# Patient Record
Sex: Female | Born: 1935 | Race: White | Hispanic: No | Smoking: Never smoker
Health system: Southern US, Community
[De-identification: ages and names within clinical notes are randomized; demographics above are authoritative.]

## PROBLEM LIST (undated history)

## (undated) DIAGNOSIS — Z531 Procedure and treatment not carried out because of patient's decision for reasons of belief and group pressure: Secondary | ICD-10-CM

## (undated) DIAGNOSIS — Z8542 Personal history of malignant neoplasm of other parts of uterus: Secondary | ICD-10-CM

## (undated) DIAGNOSIS — IMO0001 Reserved for inherently not codable concepts without codable children: Secondary | ICD-10-CM

## (undated) DIAGNOSIS — K3184 Gastroparesis: Secondary | ICD-10-CM

## (undated) DIAGNOSIS — H269 Unspecified cataract: Secondary | ICD-10-CM

## (undated) DIAGNOSIS — D500 Iron deficiency anemia secondary to blood loss (chronic): Secondary | ICD-10-CM

## (undated) DIAGNOSIS — R011 Cardiac murmur, unspecified: Secondary | ICD-10-CM

## (undated) DIAGNOSIS — Z789 Other specified health status: Secondary | ICD-10-CM

## (undated) DIAGNOSIS — G2581 Restless legs syndrome: Secondary | ICD-10-CM

## (undated) DIAGNOSIS — D649 Anemia, unspecified: Secondary | ICD-10-CM

## (undated) DIAGNOSIS — I639 Cerebral infarction, unspecified: Secondary | ICD-10-CM

## (undated) DIAGNOSIS — M1990 Unspecified osteoarthritis, unspecified site: Secondary | ICD-10-CM

## (undated) DIAGNOSIS — R7989 Other specified abnormal findings of blood chemistry: Secondary | ICD-10-CM

## (undated) DIAGNOSIS — I350 Nonrheumatic aortic (valve) stenosis: Secondary | ICD-10-CM

## (undated) DIAGNOSIS — C652 Malignant neoplasm of left renal pelvis: Secondary | ICD-10-CM

## (undated) DIAGNOSIS — I10 Essential (primary) hypertension: Secondary | ICD-10-CM

## (undated) DIAGNOSIS — K602 Anal fissure, unspecified: Secondary | ICD-10-CM

## (undated) DIAGNOSIS — N059 Unspecified nephritic syndrome with unspecified morphologic changes: Secondary | ICD-10-CM

## (undated) DIAGNOSIS — R112 Nausea with vomiting, unspecified: Secondary | ICD-10-CM

## (undated) DIAGNOSIS — Z9989 Dependence on other enabling machines and devices: Secondary | ICD-10-CM

## (undated) DIAGNOSIS — I77810 Thoracic aortic ectasia: Secondary | ICD-10-CM

## (undated) DIAGNOSIS — K627 Radiation proctitis: Secondary | ICD-10-CM

## (undated) DIAGNOSIS — E039 Hypothyroidism, unspecified: Secondary | ICD-10-CM

## (undated) DIAGNOSIS — Z972 Presence of dental prosthetic device (complete) (partial): Secondary | ICD-10-CM

## (undated) DIAGNOSIS — I2510 Atherosclerotic heart disease of native coronary artery without angina pectoris: Secondary | ICD-10-CM

## (undated) DIAGNOSIS — I480 Paroxysmal atrial fibrillation: Secondary | ICD-10-CM

## (undated) DIAGNOSIS — Z973 Presence of spectacles and contact lenses: Secondary | ICD-10-CM

## (undated) DIAGNOSIS — R945 Abnormal results of liver function studies: Secondary | ICD-10-CM

## (undated) DIAGNOSIS — M869 Osteomyelitis, unspecified: Secondary | ICD-10-CM

## (undated) DIAGNOSIS — E785 Hyperlipidemia, unspecified: Secondary | ICD-10-CM

## (undated) DIAGNOSIS — C801 Malignant (primary) neoplasm, unspecified: Secondary | ICD-10-CM

## (undated) DIAGNOSIS — C169 Malignant neoplasm of stomach, unspecified: Secondary | ICD-10-CM

## (undated) DIAGNOSIS — C541 Malignant neoplasm of endometrium: Secondary | ICD-10-CM

## (undated) DIAGNOSIS — K219 Gastro-esophageal reflux disease without esophagitis: Secondary | ICD-10-CM

## (undated) DIAGNOSIS — N189 Chronic kidney disease, unspecified: Secondary | ICD-10-CM

## (undated) DIAGNOSIS — Z9889 Other specified postprocedural states: Secondary | ICD-10-CM

## (undated) DIAGNOSIS — I495 Sick sinus syndrome: Secondary | ICD-10-CM

## (undated) DIAGNOSIS — F419 Anxiety disorder, unspecified: Secondary | ICD-10-CM

## (undated) DIAGNOSIS — I517 Cardiomegaly: Secondary | ICD-10-CM

## (undated) DIAGNOSIS — Z950 Presence of cardiac pacemaker: Secondary | ICD-10-CM

## (undated) DIAGNOSIS — G629 Polyneuropathy, unspecified: Secondary | ICD-10-CM

## (undated) DIAGNOSIS — H9190 Unspecified hearing loss, unspecified ear: Secondary | ICD-10-CM

## (undated) DIAGNOSIS — C679 Malignant neoplasm of bladder, unspecified: Secondary | ICD-10-CM

## (undated) HISTORY — DX: Essential (primary) hypertension: I10

## (undated) HISTORY — DX: Sick sinus syndrome: I49.5

## (undated) HISTORY — PX: NEPHRECTOMY: SHX65

## (undated) HISTORY — PX: PATENT DUCTUS ARTERIOUS REPAIR: SHX269

## (undated) HISTORY — DX: Hyperlipidemia, unspecified: E78.5

## (undated) HISTORY — DX: Gastroparesis: K31.84

## (undated) HISTORY — DX: Paroxysmal atrial fibrillation: I48.0

## (undated) HISTORY — DX: Malignant neoplasm of bladder, unspecified: C67.9

## (undated) HISTORY — DX: Malignant neoplasm of endometrium: C54.1

## (undated) HISTORY — DX: Nonrheumatic aortic (valve) stenosis: I35.0

## (undated) HISTORY — DX: Gastro-esophageal reflux disease without esophagitis: K21.9

## (undated) HISTORY — DX: Chronic kidney disease, unspecified: N18.9

## (undated) HISTORY — PX: EYE SURGERY: SHX253

## (undated) HISTORY — DX: Atherosclerotic heart disease of native coronary artery without angina pectoris: I25.10

## (undated) HISTORY — DX: Anal fissure, unspecified: K60.2

## (undated) HISTORY — DX: Hypothyroidism, unspecified: E03.9

## (undated) HISTORY — DX: Malignant neoplasm of left renal pelvis: C65.2

## (undated) HISTORY — PX: CHOLECYSTECTOMY: SHX55

## (undated) HISTORY — DX: Abnormal results of liver function studies: R94.5

## (undated) HISTORY — DX: Other specified abnormal findings of blood chemistry: R79.89

## (undated) HISTORY — DX: Thoracic aortic ectasia: I77.810

## (undated) HISTORY — DX: Iron deficiency anemia secondary to blood loss (chronic): D50.0

## (undated) HISTORY — DX: Personal history of malignant neoplasm of other parts of uterus: Z85.42

## (undated) HISTORY — DX: Radiation proctitis: K62.7

---
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
## 1983-09-07 HISTORY — PX: TOTAL ABDOMINAL HYSTERECTOMY: SHX209

## 2000-12-30 ENCOUNTER — Encounter: Payer: Self-pay | Admitting: Gastroenterology

## 2000-12-30 ENCOUNTER — Ambulatory Visit (HOSPITAL_COMMUNITY): Admission: RE | Admit: 2000-12-30 | Discharge: 2000-12-30 | Payer: Self-pay | Admitting: Gastroenterology

## 2001-01-09 ENCOUNTER — Ambulatory Visit (HOSPITAL_COMMUNITY): Admission: RE | Admit: 2001-01-09 | Discharge: 2001-01-09 | Payer: Self-pay | Admitting: Gastroenterology

## 2001-01-16 ENCOUNTER — Ambulatory Visit (HOSPITAL_COMMUNITY): Admission: RE | Admit: 2001-01-16 | Discharge: 2001-01-16 | Payer: Self-pay | Admitting: Gastroenterology

## 2001-01-16 ENCOUNTER — Encounter: Payer: Self-pay | Admitting: Gastroenterology

## 2002-01-02 ENCOUNTER — Encounter: Payer: Self-pay | Admitting: Urology

## 2002-01-02 ENCOUNTER — Encounter: Admission: RE | Admit: 2002-01-02 | Discharge: 2002-01-02 | Payer: Self-pay | Admitting: Urology

## 2002-06-05 ENCOUNTER — Encounter: Admission: RE | Admit: 2002-06-05 | Discharge: 2002-06-05 | Payer: Self-pay | Admitting: Urology

## 2002-06-05 ENCOUNTER — Encounter: Payer: Self-pay | Admitting: Urology

## 2002-06-11 ENCOUNTER — Encounter: Payer: Self-pay | Admitting: Urology

## 2002-06-11 ENCOUNTER — Ambulatory Visit (HOSPITAL_BASED_OUTPATIENT_CLINIC_OR_DEPARTMENT_OTHER): Admission: RE | Admit: 2002-06-11 | Discharge: 2002-06-11 | Payer: Self-pay | Admitting: Urology

## 2002-06-11 ENCOUNTER — Encounter (INDEPENDENT_AMBULATORY_CARE_PROVIDER_SITE_OTHER): Payer: Self-pay | Admitting: Specialist

## 2002-06-15 ENCOUNTER — Encounter: Admission: RE | Admit: 2002-06-15 | Discharge: 2002-06-15 | Payer: Self-pay | Admitting: Urology

## 2002-06-15 ENCOUNTER — Encounter: Payer: Self-pay | Admitting: Urology

## 2002-06-20 ENCOUNTER — Encounter: Payer: Self-pay | Admitting: Urology

## 2002-06-27 ENCOUNTER — Encounter (INDEPENDENT_AMBULATORY_CARE_PROVIDER_SITE_OTHER): Payer: Self-pay | Admitting: Specialist

## 2002-06-27 ENCOUNTER — Inpatient Hospital Stay (HOSPITAL_COMMUNITY): Admission: RE | Admit: 2002-06-27 | Discharge: 2002-07-02 | Payer: Self-pay | Admitting: Urology

## 2002-12-12 ENCOUNTER — Ambulatory Visit (HOSPITAL_BASED_OUTPATIENT_CLINIC_OR_DEPARTMENT_OTHER): Admission: RE | Admit: 2002-12-12 | Discharge: 2002-12-12 | Payer: Self-pay | Admitting: Urology

## 2002-12-12 ENCOUNTER — Encounter (INDEPENDENT_AMBULATORY_CARE_PROVIDER_SITE_OTHER): Payer: Self-pay | Admitting: Specialist

## 2004-06-01 ENCOUNTER — Encounter: Admission: RE | Admit: 2004-06-01 | Discharge: 2004-06-01 | Payer: Self-pay | Admitting: Otolaryngology

## 2004-09-30 ENCOUNTER — Ambulatory Visit: Payer: Self-pay

## 2005-07-19 ENCOUNTER — Encounter (HOSPITAL_COMMUNITY): Admission: RE | Admit: 2005-07-19 | Discharge: 2005-09-03 | Payer: Self-pay | Admitting: Neurology

## 2005-07-22 ENCOUNTER — Ambulatory Visit (HOSPITAL_COMMUNITY): Admission: RE | Admit: 2005-07-22 | Discharge: 2005-07-22 | Payer: Self-pay | Admitting: Cardiovascular Disease

## 2005-12-15 ENCOUNTER — Ambulatory Visit: Payer: Self-pay | Admitting: Gastroenterology

## 2006-01-11 ENCOUNTER — Ambulatory Visit: Payer: Self-pay | Admitting: Gastroenterology

## 2006-01-31 ENCOUNTER — Encounter: Payer: Self-pay | Admitting: Gastroenterology

## 2006-03-23 ENCOUNTER — Emergency Department (HOSPITAL_COMMUNITY): Admission: EM | Admit: 2006-03-23 | Discharge: 2006-03-23 | Payer: Self-pay | Admitting: Emergency Medicine

## 2006-07-13 ENCOUNTER — Encounter: Admission: RE | Admit: 2006-07-13 | Discharge: 2006-07-13 | Payer: Self-pay | Admitting: Orthopedic Surgery

## 2006-09-06 HISTORY — PX: CARDIAC CATHETERIZATION: SHX172

## 2006-11-28 ENCOUNTER — Ambulatory Visit: Payer: Self-pay | Admitting: Gastroenterology

## 2006-11-28 LAB — CONVERTED CEMR LAB
AST: 22 units/L (ref 0–37)
Alkaline Phosphatase: 132 units/L — ABNORMAL HIGH (ref 39–117)
Basophils Relative: 0.5 % (ref 0.0–1.0)
CO2: 28 meq/L (ref 19–32)
Chloride: 109 meq/L (ref 96–112)
Creatinine, Ser: 1.1 mg/dL (ref 0.4–1.2)
HCT: 39.4 % (ref 36.0–46.0)
Hemoglobin: 13.6 g/dL (ref 12.0–15.0)
Monocytes Absolute: 0.7 10*3/uL (ref 0.2–0.7)
Neutrophils Relative %: 67.3 % (ref 43.0–77.0)
Potassium: 3.9 meq/L (ref 3.5–5.1)
RBC: 4.47 M/uL (ref 3.87–5.11)
RDW: 12.8 % (ref 11.5–14.6)
Sodium: 144 meq/L (ref 135–145)
Total Bilirubin: 0.6 mg/dL (ref 0.3–1.2)
Total Protein: 6.6 g/dL (ref 6.0–8.3)
WBC: 7.2 10*3/uL (ref 4.5–10.5)

## 2006-12-01 ENCOUNTER — Ambulatory Visit: Payer: Self-pay | Admitting: Gastroenterology

## 2006-12-01 ENCOUNTER — Encounter (INDEPENDENT_AMBULATORY_CARE_PROVIDER_SITE_OTHER): Payer: Self-pay | Admitting: Specialist

## 2006-12-08 ENCOUNTER — Ambulatory Visit (HOSPITAL_COMMUNITY): Admission: RE | Admit: 2006-12-08 | Discharge: 2006-12-08 | Payer: Self-pay | Admitting: Gastroenterology

## 2006-12-28 ENCOUNTER — Ambulatory Visit: Payer: Self-pay | Admitting: Gastroenterology

## 2007-01-09 ENCOUNTER — Ambulatory Visit (HOSPITAL_COMMUNITY): Admission: RE | Admit: 2007-01-09 | Discharge: 2007-01-09 | Payer: Self-pay | Admitting: Gastroenterology

## 2007-01-11 ENCOUNTER — Ambulatory Visit (HOSPITAL_COMMUNITY): Admission: RE | Admit: 2007-01-11 | Discharge: 2007-01-11 | Payer: Self-pay | Admitting: Gastroenterology

## 2007-01-17 ENCOUNTER — Ambulatory Visit: Payer: Self-pay | Admitting: Gastroenterology

## 2007-01-18 ENCOUNTER — Ambulatory Visit: Payer: Self-pay | Admitting: Gastroenterology

## 2007-02-28 ENCOUNTER — Encounter: Payer: Self-pay | Admitting: Gastroenterology

## 2007-02-28 ENCOUNTER — Ambulatory Visit (HOSPITAL_COMMUNITY): Admission: RE | Admit: 2007-02-28 | Discharge: 2007-02-28 | Payer: Self-pay | Admitting: Gastroenterology

## 2007-03-06 ENCOUNTER — Ambulatory Visit: Payer: Self-pay | Admitting: Gastroenterology

## 2007-03-21 ENCOUNTER — Observation Stay (HOSPITAL_COMMUNITY): Admission: EM | Admit: 2007-03-21 | Discharge: 2007-03-23 | Payer: Self-pay | Admitting: Emergency Medicine

## 2007-03-21 ENCOUNTER — Ambulatory Visit: Payer: Self-pay | Admitting: Cardiology

## 2007-03-22 ENCOUNTER — Ambulatory Visit: Payer: Self-pay | Admitting: Vascular Surgery

## 2007-03-22 ENCOUNTER — Encounter (INDEPENDENT_AMBULATORY_CARE_PROVIDER_SITE_OTHER): Payer: Self-pay | Admitting: Internal Medicine

## 2007-04-14 ENCOUNTER — Encounter: Admission: RE | Admit: 2007-04-14 | Discharge: 2007-04-14 | Payer: Self-pay | Admitting: Orthopedic Surgery

## 2007-05-31 ENCOUNTER — Ambulatory Visit: Payer: Self-pay | Admitting: Gastroenterology

## 2007-06-09 ENCOUNTER — Inpatient Hospital Stay (HOSPITAL_COMMUNITY): Admission: EM | Admit: 2007-06-09 | Discharge: 2007-06-13 | Payer: Self-pay | Admitting: *Deleted

## 2007-06-09 ENCOUNTER — Ambulatory Visit: Payer: Self-pay | Admitting: *Deleted

## 2007-08-29 ENCOUNTER — Encounter: Admission: RE | Admit: 2007-08-29 | Discharge: 2007-08-29 | Payer: Self-pay | Admitting: Orthopedic Surgery

## 2007-09-07 HISTORY — PX: TOTAL HIP REVISION: SHX763

## 2007-09-08 ENCOUNTER — Encounter: Admission: RE | Admit: 2007-09-08 | Discharge: 2007-09-08 | Payer: Self-pay | Admitting: Orthopedic Surgery

## 2007-10-10 ENCOUNTER — Inpatient Hospital Stay (HOSPITAL_COMMUNITY): Admission: RE | Admit: 2007-10-10 | Discharge: 2007-10-13 | Payer: Self-pay | Admitting: Orthopedic Surgery

## 2007-11-17 DIAGNOSIS — C649 Malignant neoplasm of unspecified kidney, except renal pelvis: Secondary | ICD-10-CM | POA: Insufficient documentation

## 2007-11-17 DIAGNOSIS — M869 Osteomyelitis, unspecified: Secondary | ICD-10-CM | POA: Insufficient documentation

## 2007-11-17 DIAGNOSIS — G2581 Restless legs syndrome: Secondary | ICD-10-CM

## 2007-11-17 DIAGNOSIS — E039 Hypothyroidism, unspecified: Secondary | ICD-10-CM

## 2007-11-17 DIAGNOSIS — N6019 Diffuse cystic mastopathy of unspecified breast: Secondary | ICD-10-CM

## 2007-11-17 DIAGNOSIS — K520 Gastroenteritis and colitis due to radiation: Secondary | ICD-10-CM

## 2007-11-17 DIAGNOSIS — K602 Anal fissure, unspecified: Secondary | ICD-10-CM | POA: Insufficient documentation

## 2007-12-16 ENCOUNTER — Inpatient Hospital Stay (HOSPITAL_COMMUNITY): Admission: EM | Admit: 2007-12-16 | Discharge: 2007-12-19 | Payer: Self-pay | Admitting: Emergency Medicine

## 2007-12-16 ENCOUNTER — Ambulatory Visit: Payer: Self-pay | Admitting: Cardiovascular Disease

## 2008-01-04 ENCOUNTER — Telehealth: Payer: Self-pay | Admitting: Gastroenterology

## 2008-08-16 ENCOUNTER — Ambulatory Visit: Payer: Self-pay | Admitting: Cardiovascular Disease

## 2008-08-17 ENCOUNTER — Inpatient Hospital Stay (HOSPITAL_COMMUNITY): Admission: EM | Admit: 2008-08-17 | Discharge: 2008-08-19 | Payer: Self-pay | Admitting: Emergency Medicine

## 2008-11-27 ENCOUNTER — Encounter (INDEPENDENT_AMBULATORY_CARE_PROVIDER_SITE_OTHER): Payer: Self-pay | Admitting: *Deleted

## 2009-01-02 ENCOUNTER — Ambulatory Visit: Payer: Self-pay | Admitting: Gastroenterology

## 2009-01-16 ENCOUNTER — Ambulatory Visit: Payer: Self-pay | Admitting: Gastroenterology

## 2009-06-27 ENCOUNTER — Telehealth: Payer: Self-pay | Admitting: Gastroenterology

## 2009-07-22 ENCOUNTER — Ambulatory Visit: Payer: Self-pay | Admitting: Gastroenterology

## 2010-04-06 ENCOUNTER — Ambulatory Visit (HOSPITAL_COMMUNITY): Admission: RE | Admit: 2010-04-06 | Discharge: 2010-04-06 | Payer: Self-pay | Admitting: Internal Medicine

## 2010-05-01 ENCOUNTER — Ambulatory Visit: Payer: Self-pay | Admitting: Cardiology

## 2010-05-21 ENCOUNTER — Ambulatory Visit: Payer: Self-pay | Admitting: Vascular Surgery

## 2010-06-03 ENCOUNTER — Ambulatory Visit: Payer: Self-pay | Admitting: Cardiology

## 2010-06-24 ENCOUNTER — Ambulatory Visit: Payer: Self-pay | Admitting: Cardiology

## 2010-06-29 ENCOUNTER — Encounter: Admission: RE | Admit: 2010-06-29 | Discharge: 2010-06-29 | Payer: Self-pay | Admitting: Cardiology

## 2010-06-29 ENCOUNTER — Ambulatory Visit: Payer: Self-pay | Admitting: Cardiology

## 2010-07-01 ENCOUNTER — Ambulatory Visit: Payer: Self-pay | Admitting: Cardiology

## 2010-07-02 ENCOUNTER — Ambulatory Visit: Payer: Self-pay | Admitting: Cardiology

## 2010-07-02 ENCOUNTER — Inpatient Hospital Stay (HOSPITAL_COMMUNITY)
Admission: RE | Admit: 2010-07-02 | Discharge: 2010-07-03 | Payer: Self-pay | Source: Home / Self Care | Admitting: Cardiology

## 2010-07-10 ENCOUNTER — Ambulatory Visit: Payer: Self-pay | Admitting: Cardiology

## 2010-07-14 ENCOUNTER — Encounter: Payer: Self-pay | Admitting: Internal Medicine

## 2010-07-20 ENCOUNTER — Ambulatory Visit: Payer: Self-pay

## 2010-07-20 ENCOUNTER — Encounter: Payer: Self-pay | Admitting: Internal Medicine

## 2010-07-22 ENCOUNTER — Telehealth (INDEPENDENT_AMBULATORY_CARE_PROVIDER_SITE_OTHER): Payer: Self-pay | Admitting: *Deleted

## 2010-07-24 ENCOUNTER — Ambulatory Visit: Payer: Self-pay | Admitting: Cardiology

## 2010-09-01 ENCOUNTER — Ambulatory Visit: Payer: Self-pay | Admitting: Cardiology

## 2010-09-21 ENCOUNTER — Ambulatory Visit: Payer: Self-pay | Admitting: Cardiology

## 2010-10-08 NOTE — Progress Notes (Signed)
Summary: question on device   Phone Note Call from Patient Call back at Work Phone [phone]   Caller: Patient Reason for Call: Talk to Nurse Summary of Call: pt has question re her pace maker. pt states her heart rate is been going up this morning.  Initial call taken by: Roe Coombs,  July 22, 2010 1:23 PM  Follow-up for Phone Call        Patient c/o heart racing. She will follow up with Dr. Patty Sermons in the am. Follow-up by: Altha Harm, LPN,  July 23, 2010 4:55 PM

## 2010-10-08 NOTE — Cardiovascular Report (Signed)
Summary: Office Visit   Office Visit   Imported By: Roderic Ovens 07/27/2010 15:47:59  _____________________________________________________________________  External Attachment:    Type:   Image     Comment:   External Document

## 2010-10-08 NOTE — Procedures (Signed)
Summary: wch/ gd  Medications Added FUROSEMIDE 20 MG TABS (FUROSEMIDE) Take 1/2 tablet by mouth once daily FISH OIL 1000 MG CAPS (OMEGA-3 FATTY ACIDS) Take 1 capsule by mouth every morning      Allergies Added:   Current Medications (verified): 1)  Atenolol-Chlorthalidone 100-25 Mg Tabs (Atenolol-Chlorthalidone) .... Two Times A Day 2)  Hyzaar 100-25 Mg Tabs (Losartan Potassium-Hctz) .... Once Daily 3)  Sertraline Hcl 50 Mg Tabs (Sertraline Hcl) .... Once Daily 4)  Synthroid 100 Mcg Tabs (Levothyroxine Sodium) .... Once Daily 5)  Amiodarone Hcl 200 Mg Tabs (Amiodarone Hcl) .... Take 1/2 Tablet  Every Morning 6)  Klor-Con M20 20 Meq Cr-Tabs (Potassium Chloride Crys Cr) .... Once Daily 7)  Aspirin 81 Mg Tbec (Aspirin) .... Once Daily 8)  Acid Controller 10 Mg Tabs (Famotidine) .... Once Daily 9)  Lyrica 75 Mg Caps (Pregabalin) .... Every Evening 10)  Tekturna 300 Mg Tabs (Aliskiren Fumarate) .... Once Daily 11)  Warfarin Sodium 2 Mg Tabs (Warfarin Sodium) .... Once Daily 12)  Lipitor 80 Mg Tabs (Atorvastatin Calcium) .... Take As Directed 13)  Vitamin D 400 Unit Tabs (Cholecalciferol) .... Take 2 Daily 14)  Vitamin B-12 1000 Mcg Tabs (Cyanocobalamin) .... Once Daily 15)  B Complex  Tabs (B Complex Vitamins) .... Once Daily 16)  Calcium 1000mg  .... Once Daily 17)  Furosemide 20 Mg Tabs (Furosemide) .... Take 1/2 Tablet By Mouth Once Daily 18)  Fish Oil 1000 Mg Caps (Omega-3 Fatty Acids) .... Take 1 Capsule By Mouth Every Morning  Allergies (verified): 1)  ! Codeine 2)  ! * Zinc  PPM Specifications Following MD:  Hillis Range, MD     Referring MD:  Rolla Plate PPM Vendor:  Medtronic     PPM Model Number:  [id number]     PPM Serial Number:  [id number] PPM DOI:  07/02/2010     PPM Implanting MD:  Rolla Plate  Lead 1    Location: RA     DOI: 07/02/2010     Model #: [id number]     Serial #: [id number]     Status: active Lead 2    Location: RV     DOI: 07/02/2010     Model #: [id number]      Serial #: [id number]     Status: active  Magnet Response Rate:  BOL 85 ERI  65  Indications:  Sick sinus syndrome   PPM Follow Up Battery Voltage:  2.81 V     Battery Est. Longevity:  10 yrs       PPM Device Measurements Atrium  Amplitude: 5.60 mV, Impedance: 619 ohms, Threshold: 0.50 V at 0.40 msec Right Ventricle  Amplitude: 11.20 mV, Impedance: 557 ohms, Threshold: 0.50 V at 0.40 msec  Episodes MS Episodes:  7     Percent Mode Switch:  <0.1%     Ventricular High Rate:  0     Atrial Pacing:  97.9%     Ventricular Pacing:  0.2%  Parameters Mode:  MVP     Lower Rate Limit:  60     Upper Rate Limit:  130 Paced AV Delay:  180     Sensed AV Delay:  150 Next Cardiology Appt Due:  10/07/2010 Tech Comments:  WOUND CHECK---STERI STRIPS REMOVED.  NO REDNESS OR SWELLING AT SITE.  NORMAL DEVICE FUNCTION.  NO CHANGES MADE. ROV IN 3 MTHS W/JA. Vella Kohler  July 20, 2010 9:38 AM

## 2010-10-08 NOTE — Miscellaneous (Signed)
Summary: Device preload  Clinical Lists Changes  Observations: Added new observation of PPM INDICATN: Sick sinus syndrome (07/14/2010 8:21) Added new observation of MAGNET RTE: BOL 85 ERI  65 (07/14/2010 8:21) Added new observation of PPMLEADSTAT2: active (07/14/2010 8:21) Added new observation of PPMLEADSER2: [id number]  (07/14/2010 8:21) Added new observation of PPMLEADMOD2: 4470  (07/14/2010 8:21) Added new observation of PPMLEADLOC2: RV  (07/14/2010 8:21) Added new observation of PPMLEADSTAT1: active  (07/14/2010 8:21) Added new observation of PPMLEADSER1: [id number]  (07/14/2010 8:21) Added new observation of PPMLEADMOD1: 4469  (07/14/2010 8:21) Added new observation of PPMLEADLOC1: RA  (07/14/2010 8:21) Added new observation of PPMLEADDOI2: 07/02/2010  (07/14/2010 8:21) Added new observation of PPMLEADDOI1: 07/02/2010  (07/14/2010 8:21) Added new observation of PPM DOI: 07/02/2010  (07/14/2010 8:21) Added new observation of PPM SERL#: [id number]  (07/14/2010 8:21) Added new observation of PPM MODL#: [id number]  (07/14/2010 9:56) Added new observation of PACEMAKERMFG: Medtronic  (07/14/2010 8:21) Added new observation of PPM IMP MD: Duffy Rhody Tennant,MD  (07/14/2010 8:21) Added new observation of PPM REFER MD: Rolla Plate  (07/14/2010 8:21) Added new observation of PACEMAKER MD: Hillis Range, MD  (07/14/2010 8:21)      PPM Specifications Following MD:  Hillis Range, MD     Referring MD:  Rolla Plate PPM Vendor:  Medtronic     PPM Model Number:  [id number]     PPM Serial Number:  [id number] PPM DOI:  07/02/2010     PPM Implanting MD:  Rolla Plate  Lead 1    Location: RA     DOI: 07/02/2010     Model #: [id number]     Serial #: [id number]     Status: active Lead 2    Location: RV     DOI: 07/02/2010     Model #: [id number]     Serial #: [id number]     Status: active  Magnet Response Rate:  BOL 85 ERI  65  Indications:  Sick sinus syndrome

## 2010-10-12 ENCOUNTER — Ambulatory Visit (INDEPENDENT_AMBULATORY_CARE_PROVIDER_SITE_OTHER): Payer: Medicare Other | Admitting: Cardiology

## 2010-10-12 DIAGNOSIS — I060 Rheumatic aortic stenosis: Secondary | ICD-10-CM

## 2010-10-12 DIAGNOSIS — I4891 Unspecified atrial fibrillation: Secondary | ICD-10-CM

## 2010-10-21 ENCOUNTER — Encounter: Payer: Self-pay | Admitting: Internal Medicine

## 2010-10-30 ENCOUNTER — Observation Stay (HOSPITAL_COMMUNITY)
Admission: EM | Admit: 2010-10-30 | Discharge: 2010-10-31 | Disposition: A | Discharge status: Home Health | Payer: 59 | Attending: Internal Medicine | Admitting: Internal Medicine

## 2010-10-30 DIAGNOSIS — E039 Hypothyroidism, unspecified: Secondary | ICD-10-CM | POA: Insufficient documentation

## 2010-10-30 DIAGNOSIS — I2510 Atherosclerotic heart disease of native coronary artery without angina pectoris: Secondary | ICD-10-CM | POA: Insufficient documentation

## 2010-10-30 DIAGNOSIS — E876 Hypokalemia: Secondary | ICD-10-CM | POA: Insufficient documentation

## 2010-10-30 DIAGNOSIS — I359 Nonrheumatic aortic valve disorder, unspecified: Secondary | ICD-10-CM | POA: Insufficient documentation

## 2010-10-30 DIAGNOSIS — Z950 Presence of cardiac pacemaker: Secondary | ICD-10-CM | POA: Insufficient documentation

## 2010-10-30 DIAGNOSIS — I10 Essential (primary) hypertension: Secondary | ICD-10-CM | POA: Insufficient documentation

## 2010-10-30 DIAGNOSIS — R5381 Other malaise: Secondary | ICD-10-CM | POA: Insufficient documentation

## 2010-10-30 DIAGNOSIS — I4891 Unspecified atrial fibrillation: Secondary | ICD-10-CM

## 2010-10-30 DIAGNOSIS — R002 Palpitations: Secondary | ICD-10-CM | POA: Insufficient documentation

## 2010-10-30 DIAGNOSIS — E785 Hyperlipidemia, unspecified: Secondary | ICD-10-CM | POA: Insufficient documentation

## 2010-10-30 LAB — POCT CARDIAC MARKERS: Myoglobin, poc: 99.2 ng/mL (ref 12–200)

## 2010-10-30 LAB — POCT I-STAT, CHEM 8
BUN: 36 mg/dL — ABNORMAL HIGH (ref 6–23)
Calcium, Ion: 1.07 mmol/L — ABNORMAL LOW (ref 1.12–1.32)
Creatinine, Ser: 1.5 mg/dL — ABNORMAL HIGH (ref 0.4–1.2)
Hemoglobin: 13.3 g/dL (ref 12.0–15.0)
Sodium: 142 mEq/L (ref 135–145)
TCO2: 25 mmol/L (ref 0–100)

## 2010-10-30 LAB — PROTIME-INR: Prothrombin Time: 25.3 seconds — ABNORMAL HIGH (ref 11.6–15.2)

## 2010-10-30 LAB — CARDIAC PANEL(CRET KIN+CKTOT+MB+TROPI)
CK, MB: 1.9 ng/mL (ref 0.3–4.0)
Relative Index: INVALID (ref 0.0–2.5)
Total CK: 99 U/L (ref 7–177)

## 2010-10-30 LAB — CBC
Hemoglobin: 13.1 g/dL (ref 12.0–15.0)
MCH: 29 pg (ref 26.0–34.0)
MCHC: 34 g/dL (ref 30.0–36.0)
Platelets: 185 10*3/uL (ref 150–400)
RDW: 15.5 % (ref 11.5–15.5)

## 2010-10-30 LAB — DIFFERENTIAL
Basophils Absolute: 0.1 10*3/uL (ref 0.0–0.1)
Basophils Relative: 1 % (ref 0–1)
Eosinophils Absolute: 0.3 10*3/uL (ref 0.0–0.7)
Eosinophils Relative: 4 % (ref 0–5)
Monocytes Absolute: 0.8 10*3/uL (ref 0.1–1.0)
Monocytes Relative: 10 % (ref 3–12)
Neutro Abs: 4 10*3/uL (ref 1.7–7.7)

## 2010-10-31 LAB — CARDIAC PANEL(CRET KIN+CKTOT+MB+TROPI)
Relative Index: INVALID (ref 0.0–2.5)
Total CK: 93 U/L (ref 7–177)
Troponin I: 0.01 ng/mL (ref 0.00–0.06)

## 2010-10-31 LAB — T4, FREE: Free T4: 1.46 ng/dL (ref 0.80–1.80)

## 2010-11-11 ENCOUNTER — Ambulatory Visit (INDEPENDENT_AMBULATORY_CARE_PROVIDER_SITE_OTHER): Payer: Medicare Other | Admitting: Nurse Practitioner

## 2010-11-11 DIAGNOSIS — Z7901 Long term (current) use of anticoagulants: Secondary | ICD-10-CM

## 2010-11-11 DIAGNOSIS — I4891 Unspecified atrial fibrillation: Secondary | ICD-10-CM

## 2010-11-11 DIAGNOSIS — Z950 Presence of cardiac pacemaker: Secondary | ICD-10-CM

## 2010-11-13 NOTE — Discharge Summary (Signed)
[name], [name]              ACCOUNT NO.:  [id number]  MEDICAL RECORD NO.:  [id number]           PATIENT TYPE:  I  LOCATION:  3741                         FACILITY:  [facility]  PHYSICIAN:  Keltie Labell C. Eden Emms, MD, FACCDATE OF BIRTH:  [birth year]  DATE OF ADMISSION:  10/30/2010 DATE OF DISCHARGE:  10/31/2010                              DISCHARGE SUMMARY   PRIMARY CARDIOLOGIST:  Cassell Clement, MD.  PRIMARY CARE PROVIDER:  Lucky Cowboy, MD  DISCHARGE DIAGNOSIS:  Symptomatic paroxysmal atrial fibrillation.  SECONDARY DIAGNOSES: 1. Hypertension. 2. Nonobstructive coronary disease by catheterization 2008. 3. Hyperlipidemia. 4. Hypothyroidism. 5. History of moderate aortic stenosis and mild moderate aortic     insufficiency. 6. History of patent ductus arteriosus, status post repair at age 77. 47. Tachybrady syndrome, status post Medtronic permanent pacemaker in     February 2011.  ALLERGIES: 1. CODEINE. 2. ZANTAC.  PROCEDURES:  None.  HISTORY OF PRESENT ILLNESS:  A 77 year old female with prior history of paroxysmal atrial fibrillation, maintained on low-dose amiodarone as well as Coumadin therapy at home.  She was in her usual state of health until the morning of admission when she awoke feeling weak and fatigued, and noting irregular palpitations.  She presented to [facility] ED where she was found to be in atrial fibrillation with rate in the 90s. Interrogation of her pacemaker showed that she had been in and out of atrial fibrillation since approximately 6:30 that morning.  INR is therapeutic.  The patient was admitted for further evaluation.  HOSPITAL COURSE:  The patient was given 300 mg intravenous bolus of amiodarone and her dose was increased to 200 mg b.i.d. in hopes that she would convert.  Fortunately, she did convert to sinus rhythm and did not require cardioversion.  She has been ambulating without recurrent symptoms or limitations, and will be discharged  home today in good condition.  We will continue amiodarone 200 mg b.i.d. for at least several weeks.  DISCHARGE LABS:  Hemoglobin 13.1, hematocrit 38.5, WBC 7.6, platelets 185.  INR 2.28.  Sodium 142, potassium 3.4, chloride 106, BUN 36, creatinine 1.5, glucose 92.  Cardiac markers are negative x3.  DISPOSITION:  The patient will be discharged home today in good condition.  FOLLOWUP PLANS AND APPOINTMENTS:  We will arrange followup with Dr. Patty Sermons in approximately 2 weeks.  She will continue followup with [facility].  She will follow up with Dr. Oneta Rack as previously scheduled.  DISCHARGE MEDICATIONS: 1. Amiodarone 200 mg b.i.d. 2. Atenolol 50 mg b.i.d. 3. "Acid reducer" 1 tab daily. 4. Hyzaar 100/25 mg daily. 5. Lipitor 80 mg daily. 6. Lyrica 75 mg b.i.d. 7. Potassium chloride 20 mEq daily. 8. Sertraline 50 mg daily. 9. Synthroid 100 mcg 1/2 to 1 tablet as directed. 10.Tekturna 300 mg daily. 11.Coumadin 2 mg daily.  OUTSTANDING LABS AND STUDIES:  INR in approximately 1 week.  Patient will also require followup thyroid function testing in the near future given increased dose of amiodarone.  DURATION OF DISCHARGE ENCOUNTER:  Forty-five minutes including physician time.     [name], ANP   ______________________________ [name]. Eden Emms, MD,  FACC    CB/MEDQ  D:  10/31/2010  T:  10/31/2010  Job:  [id number]  cc:   Lucky Cowboy, M.D.  Electronically Signed by [name] ANP on 11/10/2010 04:09:42 PM Electronically Signed by Charlton Haws MD [facility] on 11/12/2010 09:07:18 AM

## 2010-11-18 LAB — SURGICAL PCR SCREEN
MRSA, PCR: NEGATIVE
Staphylococcus aureus: NEGATIVE

## 2010-11-19 ENCOUNTER — Encounter: Payer: Self-pay | Admitting: Internal Medicine

## 2010-11-25 ENCOUNTER — Ambulatory Visit (INDEPENDENT_AMBULATORY_CARE_PROVIDER_SITE_OTHER): Payer: Medicare Other | Admitting: *Deleted

## 2010-11-25 DIAGNOSIS — Z7901 Long term (current) use of anticoagulants: Secondary | ICD-10-CM

## 2010-11-25 DIAGNOSIS — I4891 Unspecified atrial fibrillation: Secondary | ICD-10-CM

## 2010-11-25 LAB — POCT INR: INR: 5

## 2010-12-02 ENCOUNTER — Other Ambulatory Visit (INDEPENDENT_AMBULATORY_CARE_PROVIDER_SITE_OTHER): Payer: Medicare Other | Admitting: *Deleted

## 2010-12-02 DIAGNOSIS — I4891 Unspecified atrial fibrillation: Secondary | ICD-10-CM | POA: Insufficient documentation

## 2010-12-02 DIAGNOSIS — Z7901 Long term (current) use of anticoagulants: Secondary | ICD-10-CM

## 2010-12-03 ENCOUNTER — Other Ambulatory Visit: Payer: Medicare Other | Admitting: *Deleted

## 2010-12-03 ENCOUNTER — Encounter: Payer: Medicare Other | Admitting: *Deleted

## 2010-12-03 NOTE — H&P (Signed)
Valerie, [name]:  [id number]  MEDICAL RECORD NO.:  [id number]           PATIENT TYPE:  E  LOCATION:  [facility]                         FACILITY:  [facility]  PHYSICIAN:  Bevelyn Buckles. Lawrencia Mauney, MDDATE OF BIRTH:  [birth year]  DATE OF ADMISSION:  10/30/2010 DATE OF DISCHARGE:                             HISTORY & PHYSICAL   PRIMARY CARE PHYSICIAN:  Dr. Lucky Cowboy.  CARDIOLOGIST:  Dr. Cassell Clement.  REASON FOR ADMISSION:  Symptomatic paroxysmal atrial fibrillation.  HISTORY OF PRESENT ILLNESS:  [name] is a very pleasant 77 year old woman with a history of hypertension, hyperlipidemia, moderate aortic stenosis by echocardiogram, and mild-to-moderate aortic insufficiency. She also has a history of paroxysmal atrial fibrillation.  This has been complicated by tachybrady syndrome.  She underwent placement of Medtronic pacemaker in October 2011.  She has been maintained on amiodarone for some time now, currently at 100 mg a day.  Overall, she has been doing quite well without any significant limitations.  This morning, she woke up and felt very weak and fatigued. Her heart was beating irregularly.  She came to the ER.  She was found to be in recurrent atrial fibrillation at a rate in the 90s. Interrogation of the pacemaker showed that she was in and out of atrial fibrillation since 6:30 this morning.  She denies any fevers, chills, nausea, or vomiting.  She has no other triggers.  She tells me her last INR was 3-4 weeks ago until it was okay, and her Coumadin dose was unchanged.  REVIEW OF SYSTEMS:  She does have some mild chest tightness with the irregularity.  This is only in the setting of AFib.  She has not had any with exertion.  She has not had any heart failure symptoms.  No bleeding.  No focal neurologic defects.  Remainder of review of systems is negative, except for HPI and problem list.  PROBLEM LIST: 1. Paroxysmal atrial fibrillation,  complicated by tachybrady syndrome.     a.     Status post Medtronic permanent pacemaker in October 2011.     b.     Maintained on low-dose amiodarone 100 mg a day.     c.     On chronic Coumadin. 2. Aortic stenosis.  This is reportedly moderate.  I do not have the     echocardiogram results in front of me.  She also apparently has     some mild-to-moderate aortic insufficiency. 3. Hypertension. 4. Nonobstructive coronary artery disease, by catheterization in 2008. 5. Hypertension. 6. Hyperlipidemia. 7. Hypothyroidism. 8. History of patent ductus arteriosus, status post repair at age 77.  CURRENT MEDICATIONS: 1. Lipitor 80 mg a day. 2. Turner 300 a day. 3. Amiodarone 100 a day. 4. Aspirin 81 a day. 5. Atenolol 50 mg b.i.d. 6. Hyzaar 100/25 a day. 7. Lyrica 75 a day. 8. Potassium 20 a day. 9. Sertraline 50 a day. 10.Synthroid 100 mcg a day. 11.Coumadin 2 mg 1 tablet daily.  ALLERGIES:  CODEINE AND ZANTAC.  SOCIAL HISTORY:  She lives in [name] with her husband.  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  She  does not smoke or drink.  FAMILY HISTORY:  Mother died of intestinal cancer.  Father died of colon cancer.  PHYSICAL EXAMINATION:  GENERAL:  She is in no acute distress.  She is lying flat in bed. VITAL SIGNS:  Blood pressure is 130/70, heart rates in the 90s.  She is satting 96% on room air. HEENT:  Normal. NECK:  Supple.  JVP is about 5-6 cm of water.  Carotids are 2+ bilaterally.  I do not hear any bruits, but it is hard to hear over the ER sounds. CARDIAC:  PMI is nondisplaced.  She is irregularly irregular with a soft systolic ejection murmur at the right sternal border. LUNGS:  Clear. ABDOMEN:  Soft, nontender, nondistended.  There is no hepatosplenomegaly or bruits noted.  Good bowel sounds. EXTREMITIES:  Warm with no cyanosis, clubbing, or edema.  No rash. NEUROLOGIC:  Alert and oriented x3.  Cranial nerves II through XII are intact.   Moves all 4 extremities without difficulty.  Affect is pleasant.  LABORATORY DATA:  Sodium is 142, potassium 3.4, creatinine 1.5.  White count 7.6, hemoglobin 13.1, platelets are 185.  INR is 2.28.  Point of care troponin is less than 0.05.  EKG shows atrial fibrillation at a rate of 96 with nonspecific ST-T wave abnormalities.  ASSESSMENT: 1. Symptomatic paroxysmal atrial fibrillation. 2. History of tachybrady syndrome, status post pacemaker in October     2011. 3. Hypokalemia. 4. Nonobstructive coronary artery disease by cardiac catheterization     in 2008.  PLAN/DISCUSSION:  [name] atrial fibrillation is quite symptomatic, despite rate control.  We will give her 300 mg of IV amiodarone in the emergency room.  If she converts, she can go home on amiodarone 200 b.i.d. for several weeks.  If she does not convert, we will bring her for 23-hour observation with plan for electrical cardioversion in the morning and discharge home on amiodarone 200 b.i.d. for several weeks.     Bevelyn Buckles. Valerie West, MD     DRB/MEDQ  D:  10/30/2010  T:  10/30/2010  Job:  [id number]  Electronically Signed by Arvilla Meres MD on 12/03/2010 06:43:24 PM

## 2010-12-09 ENCOUNTER — Encounter: Payer: Self-pay | Admitting: Cardiology

## 2010-12-09 ENCOUNTER — Ambulatory Visit (INDEPENDENT_AMBULATORY_CARE_PROVIDER_SITE_OTHER): Payer: Medicare Other | Admitting: Cardiology

## 2010-12-09 VITALS — BP 140/70 | HR 80 | Wt 149.0 lb

## 2010-12-09 DIAGNOSIS — Z79899 Other long term (current) drug therapy: Secondary | ICD-10-CM

## 2010-12-09 DIAGNOSIS — I4891 Unspecified atrial fibrillation: Secondary | ICD-10-CM

## 2010-12-09 DIAGNOSIS — I10 Essential (primary) hypertension: Secondary | ICD-10-CM

## 2010-12-09 DIAGNOSIS — R079 Chest pain, unspecified: Secondary | ICD-10-CM

## 2010-12-09 NOTE — Assessment & Plan Note (Signed)
Since she has moved her Hyzaar to lunchtime instead of in the morning, her blood pressure has been smoother at home and she's not having any dizzy spells or headaches or symptoms of orthostatic hypotension.  She will continue same medication

## 2010-12-09 NOTE — Progress Notes (Signed)
HPI: This pleasant elderly woman is seen back for a post hospital office visit.  She has a history of paroxysmal atrial fibrillation.  She responded to extra amiodarone.  Her last echocardiogram was in April of 2011 and she does have moderate aortic stenosis with normal left ventricular function.  Other medical problems include a history of uterine cancer and she is status post hysterectomy, hyperlipidemia, hypothyroidism, previous patent ductus arteriosus repair at age 72, and a past history of transitional cell carcinoma for which she is status post nephrectomy.  Current Outpatient Prescriptions  Medication Sig Dispense Refill  . amiodarone (PACERONE) 200 MG tablet Take 200 mg by mouth. taking one half tab daily      . Ascorbic Acid (VITAMIN C PO) Take by mouth daily.       [name] atenolol (TENORMIN) 50 MG tablet Take 50 mg by mouth 2 (two) times daily.        [name] atorvastatin (LIPITOR) 40 MG tablet Take 40 mg by mouth 3 (three) times a week.        . Calcium Carbonate-Vitamin D (CALCIUM + D PO) Take by mouth daily.        . Cholecalciferol (VITAMIN D PO) Take 1,000 Units by mouth daily.       . Cyanocobalamin (B-12 PO) Take by mouth every other day.        . Famotidine (PEPCID PO) Take by mouth daily.        . furosemide (LASIX) 40 MG tablet Take 20 mg by mouth daily.        [name] levothyroxine (SYNTHROID, LEVOTHROID) 100 MCG tablet Take 50 mcg by mouth daily.        [name] losartan-hydrochlorothiazide (HYZAAR) 100-25 MG per tablet Take 0.5 tablets by mouth daily.        [name] MAGNESIUM PO Take by mouth 3 (three) times a week.        . nitroGLYCERIN (NITRODUR - DOSED IN MG/24 HR) 0.4 mg/hr Place 1 patch onto the skin daily.        . nitroGLYCERIN (NITROSTAT) 0.4 MG SL tablet Place 0.4 mg under the tongue every 5 (five) minutes as needed.        . Omega-3 Fatty Acids (FISH OIL PO) Take by mouth every other day.        . potassium chloride SA (K-DUR,KLOR-CON) 20 MEQ tablet Take 20 mEq by mouth daily.        .  pregabalin (LYRICA) 75 MG capsule Take 75 mg by mouth daily.        . sertraline (ZOLOFT) 100 MG tablet Take 100 mg by mouth daily.        [name] warfarin (COUMADIN) 2 MG tablet Take 2 mg by mouth as directed.          Allergies  Allergen Reactions  . Codeine     Patient Active Problem List  Diagnoses  . RENAL CELL CANCER  . BENIGN NEOPLASM OTH&UNSPEC SITE DIGESTIVE SYSTEM  . HYPOTHYROIDISM  . HYPERLIPIDEMIA  . ANXIETY  . DEPRESSION  . RESTLESS LEG SYNDROME  . HYPERTENSION  . GASTROPARESIS  . RADIATION PROCTITIS  . ANAL FISSURE  . FIBROCYSTIC BREAST DISEASE  . OSTEOMYELITIS  . CARCINOMA, UTERUS, HX OF  . RADIATION THERAPY, HX OF  . Atrial fibrillation    History  Smoking status  . Former Smoker  Smokeless tobacco  . Not on file    History  Alcohol Use: Not on file    No family history  on file.  Review of Systems: The patient denies any heat or cold intolerance.  No weight gain or weight loss.  The patient denies headaches or blurry vision.  There is no cough or sputum production.  The patient denies dizziness.  There is no hematuria or hematochezia.  The patient denies any muscle aches or arthritis.  The patient denies any rash.  The patient denies frequent falling or instability.  There is no history of depression or anxiety.  All other systems were reviewed and are negative.   Physical Exam: Filed Vitals:   12/09/10 1622  BP: 140/70  Pulse: 80  Weight is 149, down 1 pound.  The general appearance reveals a well-developed well-nourished woman in no distress.Pupils equal and reactive.   Extraocular Movements are full.  There is no scleral icterus.  The mouth and pharynx are normal.  The neck is supple.  The carotids reveal no bruits.  The jugular venous pressure is normal.  The thyroid is not enlarged.  There is no lymphadenopathy.The chest is clear to percussion and auscultation. There are no rales or rhonchi. Expansion of the chest is symmetrical.The precordium is  quiet.  The first heart sound is normal.  The second heart sound is physiologically split.  There is no  gallop rub or click.There is a grade 2/6 systolic ejection murmur at the base.  There is no abnormal lift or heave.The abdomen is soft and nontender. Bowel sounds are normal. The liver and spleen are not enlarged. There Are no abdominal masses. There are no bruits.The pedal pulses are good.  There is no phlebitis or edema.  There is no cyanosis or clubbing.Strength is normal and symmetrical in all extremities.  There is no lateralizing weakness.  There are no sensory deficits.    Assessment / Plan: Her INR is 2.5 and she'll continue same Coumadin.  Continue amiodarone 100 mg daily per recheck a pro time in one month and office visit with Lawson Fiscal and a protime in 2 months

## 2010-12-09 NOTE — Assessment & Plan Note (Signed)
The patient is presently on amiodarone 100 mg daily to prevent recurrent atrial fibrillation.  She has not had any further episodes of atrial fibrillation.  She feels well.  Her blood pressure at home has been satisfactory.  She's not had any recent chest discomfort.

## 2010-12-10 LAB — COMPREHENSIVE METABOLIC PANEL WITH GFR
ALT: 24 U/L (ref 0–35)
AST: 28 U/L (ref 0–37)
Albumin: 3.4 g/dL — ABNORMAL LOW (ref 3.5–5.2)
Alkaline Phosphatase: 117 U/L (ref 39–117)
BUN: 30 mg/dL — ABNORMAL HIGH (ref 6–23)
CO2: 26 meq/L (ref 19–32)
Calcium: 8.6 mg/dL (ref 8.4–10.5)
Chloride: 104 meq/L (ref 96–112)
Creatinine, Ser: 1.2 mg/dL (ref 0.4–1.2)
GFR: 44.84 mL/min — ABNORMAL LOW
Glucose, Bld: 86 mg/dL (ref 70–99)
Potassium: 3.3 meq/L — ABNORMAL LOW (ref 3.5–5.1)
Sodium: 142 meq/L (ref 135–145)
Total Bilirubin: 0.6 mg/dL (ref 0.3–1.2)
Total Protein: 6.3 g/dL (ref 6.0–8.3)

## 2010-12-10 LAB — CBC WITH DIFFERENTIAL/PLATELET
Basophils Relative: 0.6 % (ref 0.0–3.0)
Eosinophils Absolute: 0.3 10*3/uL (ref 0.0–0.7)
Hemoglobin: 12.6 g/dL (ref 12.0–15.0)
Lymphocytes Relative: 30.9 % (ref 12.0–46.0)
MCHC: 34.1 g/dL (ref 30.0–36.0)
Monocytes Relative: 7.7 % (ref 3.0–12.0)
Neutro Abs: 3.4 10*3/uL (ref 1.4–7.7)
RBC: 4.15 Mil/uL (ref 3.87–5.11)

## 2010-12-16 ENCOUNTER — Telehealth: Payer: Self-pay | Admitting: Cardiology

## 2010-12-17 ENCOUNTER — Telehealth: Payer: Self-pay | Admitting: Cardiology

## 2010-12-17 NOTE — Telephone Encounter (Signed)
RETURNING YOUR CALL, WILL BE AT THIS NUMBER UNTIL 4:30PM.  AND WON'T BE AT HOME NUMBER UNTIL AFTER 5:00PM.

## 2010-12-18 ENCOUNTER — Telehealth: Payer: Self-pay | Admitting: *Deleted

## 2010-12-18 NOTE — Telephone Encounter (Signed)
Adv. Patient of lab results and increase in potassium

## 2010-12-24 ENCOUNTER — Ambulatory Visit (INDEPENDENT_AMBULATORY_CARE_PROVIDER_SITE_OTHER): Payer: 59 | Admitting: Internal Medicine

## 2010-12-24 ENCOUNTER — Encounter: Payer: Self-pay | Admitting: Internal Medicine

## 2010-12-24 DIAGNOSIS — I10 Essential (primary) hypertension: Secondary | ICD-10-CM

## 2010-12-24 DIAGNOSIS — E785 Hyperlipidemia, unspecified: Secondary | ICD-10-CM

## 2010-12-24 DIAGNOSIS — I495 Sick sinus syndrome: Secondary | ICD-10-CM

## 2010-12-24 DIAGNOSIS — I4891 Unspecified atrial fibrillation: Secondary | ICD-10-CM

## 2010-12-24 NOTE — Assessment & Plan Note (Signed)
Controlled with amiodarone 100mg  daily Dr Patty Sermons to follow LFTs/ TFTs Continue longterm coumadin

## 2010-12-24 NOTE — Assessment & Plan Note (Signed)
Normal pacemaker function See Pace Art report 

## 2010-12-24 NOTE — Patient Instructions (Signed)
Your physician wants you to follow-up in: 06/2011 with Dr Johney Frame Bonita Quin will receive a reminder letter in the mail two months in advance. If you don't receive a letter, please call our office to schedule the follow-up appointment.  Your physician recommends that you continue on your current medications as directed. Please refer to the Current Medication list given to you today.

## 2010-12-24 NOTE — Assessment & Plan Note (Signed)
Slightly above goal, however given prior dizziness which improved with decreasing of hyzaar by Dr Patty Sermons, I am reluctant to make changes today. Salt restriction.

## 2010-12-24 NOTE — Progress Notes (Signed)
[name] is a pleasant 77 y.o. yo patient with a h/o atrial fibrillation and tachycardia/ bradycardia syndrome sp PPM (MDT) by Dr Deborah Chalk  who presents today to establish care in the Electrophysiology device clinic.  She presented with tachycardia/ bradycardia syndrome and underwent PPM by Dr Deborah Chalk 07/03/10.  The patient reports doing very well since having a pacemaker implanted and remains very active despite her age.  Today, she  denies symptoms of palpitations, chest pain, shortness of breath, orthopnea, PND, lower extremity edema, dizziness, presyncope, syncope, or neurologic sequela.  The patientis tolerating medications without difficulties and is otherwise without complaint today.   Past Medical History  Diagnosis Date  . PAF (paroxysmal atrial fibrillation)     controlled with amiodarone, on coumadin  . CAD (coronary artery disease)   . Aortic root dilatation   . Transitional cell carcinoma   . History of uterine cancer   . HTN (hypertension)   . Hyperlipemia   . Hypothyroidism   . Moderate aortic stenosis   . Chronic anticoagulation   . Tachycardia-bradycardia syndrome     s/p PPM by Dr Deborah Chalk (MDT) 07/03/10    Past Surgical History  Procedure Date  . Cardiac catheterization 2008  . Nephrectomy   . Total abdominal hysterectomy   . Patent ductus arterious repair age 12  . Ptvp   . Pacemaker insertion 07/04/11    by Dr Deborah Chalk    History   Social History  . Marital Status: Married    Spouse Name: N/A    Number of Children: N/A  . Years of Education: N/A   Occupational History  . Not on file.   Social History Main Topics  . Smoking status: Former Games developer  . Smokeless tobacco: Not on file  . Alcohol Use: No  . Drug Use: No  . Sexually Active: Not on file   Other Topics Concern  . Not on file   Social History Narrative   Lives in [place].  Continues to work.    History reviewed. No pertinent family history.  Allergies  Allergen Reactions   . Codeine     Current outpatient prescriptions:amiodarone (PACERONE) 200 MG tablet, Take 200 mg by mouth. taking one half tab daily, Disp: , Rfl: ;  Ascorbic Acid (VITAMIN C PO), Take by mouth daily. , Disp: , Rfl: ;  atenolol (TENORMIN) 100 MG tablet, 1 tab po bid , Disp: , Rfl: ;  atorvastatin (LIPITOR) 40 MG tablet, Take 40 mg by mouth 3 (three) times a week.  , Disp: , Rfl: ;  Calcium Carbonate-Vitamin D (CALCIUM + D PO), Take by mouth daily.  , Disp: , Rfl:  Cholecalciferol (VITAMIN D-3) 5000 UNITS TABS, 1 tab po qd , Disp: , Rfl: ;  Cyanocobalamin (B-12 PO), Take by mouth daily. , Disp: , Rfl: ;  Famotidine (PEPCID PO), Take by mouth daily.  , Disp: , Rfl: ;  furosemide (LASIX) 40 MG tablet, 1/2 tab po qd, Disp: , Rfl: ;  levothyroxine (SYNTHROID, LEVOTHROID) 100 MCG tablet, 1/2 tab po qd, Disp: , Rfl: ;  losartan-hydrochlorothiazide (HYZAAR) 100-25 MG per tablet, 1/2 tab po qd, Disp: , Rfl:  MAGNESIUM PO, Take by mouth 3 (three) times a week.  , Disp: , Rfl: ;  nitroGLYCERIN (NITRODUR - DOSED IN MG/24 HR) 0.4 mg/hr, Place 1 patch onto the skin daily.  , Disp: , Rfl: ;  nitroGLYCERIN (NITROSTAT) 0.4 MG SL tablet, Place 0.4 mg under the tongue every 5 (five) minutes as  needed.  , Disp: , Rfl: ;  Omega-3 Fatty Acids (FISH OIL PO), Take by mouth daily. , Disp: , Rfl:  potassium chloride SA (K-DUR,KLOR-CON) 20 MEQ tablet, Take 20 mEq by mouth 2 (two) times daily. , Disp: , Rfl: ;  pregabalin (LYRICA) 75 MG capsule, Take 75 mg by mouth 2 (two) times daily. , Disp: , Rfl: ;  sertraline (ZOLOFT) 100 MG tablet, Take 100 mg by mouth daily.  , Disp: , Rfl: ;  warfarin (COUMADIN) 2 MG tablet, Take 2 mg by mouth as directed.  , Disp: , Rfl:  DISCONTD: atenolol (TENORMIN) 50 MG tablet, Take 50 mg by mouth 2 (two) times daily.  , Disp: , Rfl: ;  DISCONTD: Cholecalciferol (VITAMIN D PO), Take 1,000 Units by mouth daily. , Disp: , Rfl:   ROS- all systems are reviewed and negative except as per HPI  Physical  Exam: Filed Vitals:   12/24/10 0840  BP: 144/80  Pulse: 88  Resp: 14  Height: 5\' 1"  (1.549 m)  Weight: 148 lb (67.132 kg)    GEN- The patient is well appearing, alert and oriented x 3 today.   Head- normocephalic, atraumatic Eyes-  Sclera clear, conjunctiva pink Ears- hearing intact Oropharynx- clear Neck- supple, no JVP Lymph- no cervical lymphadenopathy Lungs- Clear to ausculation bilaterally, normal work of breathing Chest- R sided pacemaker pocket is well healed Heart- Regular rate and rhythm, no murmurs, rubs or gallops, PMI not laterally displaced GI- soft, NT, ND, + BS Extremities- no clubbing, cyanosis, or edema MS- no significant deformity or atrophy Skin- no rash or lesion Psych- euthymic mood, full affect Neuro- strength and sensation are intact

## 2010-12-24 NOTE — Assessment & Plan Note (Signed)
stable °

## 2011-01-07 ENCOUNTER — Ambulatory Visit (INDEPENDENT_AMBULATORY_CARE_PROVIDER_SITE_OTHER): Payer: 59 | Admitting: *Deleted

## 2011-01-07 DIAGNOSIS — I4891 Unspecified atrial fibrillation: Secondary | ICD-10-CM

## 2011-01-19 NOTE — Discharge Summary (Signed)
[name], [name]:  [id number]   MEDICAL RECORD NO.:  [id number]          PATIENT TYPE:  INP   LOCATION:  3703                         FACILITY:  [facility]   PHYSICIAN:  Cassell Clement, M.D. DATE OF BIRTH:  07-04-1934   DATE OF ADMISSION:  08/16/2008  DATE OF DISCHARGE:  08/19/2008                               DISCHARGE SUMMARY   FINAL DIAGNOSES:  1. Chest pain, myocardial infarction ruled out.  2. History of paroxysmal atrial fibrillation with previous admissions      to the hospital on October 2008 and April 2009.  3. Chronic Coumadin anticoagulation.  4. Obstructive branch vessel coronary artery disease including septal      perforator artery, too small for intervention.  5. Mild-to-moderate aortic insufficiency on previous echo with normal      left ventricular ejection fraction of 55%.  6. Transitional cell carcinoma, status post nephrectomy.  7. Uterine cancer, status post hysterectomy.  8. Essential hypertension.  9. Hyperlipidemia.  10.Hypothyroidism.  11.Status post patent ductus arteriosus repair at age 77.   OPERATIONS PERFORMED:  Adenosine Cardiolite stress test on August 19, 2008.   HISTORY:  This 77 year old woman developed chest tightness on the  evening prior to admission while trying to lie down to sleep and felt as  if someone was standing on her chest.  When she moved around, it got a  little bit better.  She tried one of her husband's acid blockers, and  they have also seemed to help.  Pain recurred and worsened during the  day and did radiate to the left scapula.  In the ambulance, it was  relieved by nitroglycerin.  The patient has also noticed some increased  dyspnea with exertion and increased fatigue, but otherwise, has been in  her normal state of health.  She has a past history of paroxysmal atrial  fibrillation and past history of aortic valve disease with mild-to-  moderate aortic regurgitation and with preserved left  ventricular  ejection fraction.   HOME MEDICATIONS:  1. Atenolol 150 mg daily.  2. Lipitor 40 mg daily.  3. Hyzaar 100/25 daily.  4. Aspirin 81 mg daily.  5. Amiodarone 200 mg daily.  6. Coumadin 2 mg nightly.  7. Zoloft 50 mg daily.  8. Synthroid 100 mcg daily.  9. Lyrica 75 mg daily.  10.Klor-Con 20 mEq daily.  11.Vitamin D 1000 units twice a day.  12.Calcium 600 mg daily.  13.Vitamin D daily.  14.Omega-3 daily.   PHYSICAL EXAMINATION:  VITAL SIGNS:  On admission showed a blood  pressure of 158/53, O2 sat was 98% on room air, pulse was 58 and  regular.  GENERAL:  This is a pleasant older woman in no acute distress.  She is  resting comfortably.  HEENT:  Pupils are equal and reactive.  NECK:  The carotids have no bruits.  There is no jugular venous  distention and no cervical adenopathy.  HEART:  Grade 2/6 aortic sclerosis murmur.  ABDOMEN:  Soft and nontender.  LUNGS:  Clear to percussion without rales or wheezes.  EXTREMITIES:  No clubbing, cyanosis, or  edema.  NEUROLOGIC:  Exam is physiologic.   LABORATORY DATA:  Chest x-ray shows mild lung hyperinflation.  EKG  showed sinus bradycardia and QT prolongation and no acute changes.   Initial labs; white count 5800, hemoglobin 13.4, sodium 142, potassium  3.6, BUN 17, creatinine 1.2.  Blood sugar 96.  CK-MB and troponin were  normal.  Her INR on admission was therapeutic at 2.4.   HOSPITAL COURSE:  The patient was admitted to telemetry.  Serial EKGs  and enzymes were obtained.  She was continued on her home medication.  She was given empiric proton pump inhibitor for better acid control.  She had 1 episode of subsequent chest pain after admission while walking  in the hall, and it was relieved by rest.  It was noted that the patient  had complained recently of dyspnea, and her B-natriuretic peptide was  elevated on admission at 240.  By August 19, 2008, the patient was  feeling better and was tolerating increased  activity.  Rhythm remained  sinus bradycardia.  She underwent an adenosine Cardiolite stress test,  which showed no evidence of reversible ischemia, and her ejection  fraction was 73% with normal LV function.   The patient was anxious to be discharged home.  She already has an  appointment to be seen in our office in followup in early January, which  she will keep.   ACCESSORY LABORATORY STUDIES:  Liver function studies were normal.  Total bilirubin was 1.4.  Cardiac enzymes were negative x3.  TSH was  1.6.  BNP was 240.   Her EKG showed sinus bradycardia with nonspecific T-wave abnormality,  prolonged QTc of 507.   DISCHARGE MEDICATIONS:  The patient is being discharged home on,  1. Aspirin 81 mg daily.  2. Hyzaar 100/25 daily.  3. Lipitor 80 mg half a tablet on Monday, Wednesday, Friday only.  4. K-Dur 20 mEq daily.  5. Lyrica 75 mg at bedtime.  6. Nitro-Dur patch 0.4 mg/hour patch to be applied in the morning and      removed at bedtime.  7. Pepcid AC over-the-counter one daily.  8. Atenolol 50 mg twice a day.  9. Zoloft 50 mg each morning.  10.Amlodipine 10 mg daily.  11.Amiodarone 200 mg half a tablet daily.  12.Warfarin 2 mg one daily or as directed.  13.Synthroid 100 mcg one daily.  14.Nitroglycerin 0.4 mg sublingually p.r.n. chest pain.  15.Calcium 600 mg with vitamin D one daily.  16.Vitamin D 2000 units daily.  17.Vitamin B12 one daily.   CONDITION ON DISCHARGE:  Improved.   TIME SPENT:  Total time taken with the patient and family reviewing meds  and results of hospital workup greater than 30 minutes.           ______________________________  Cassell Clement, M.D.     TB/MEDQ  D:  08/19/2008  T:  08/20/2008  Job:  [id number]   cc:   Lucky Cowboy, M.D.

## 2011-01-19 NOTE — Procedures (Signed)
DUPLEX DEEP VENOUS EXAM - LOWER EXTREMITY   INDICATION:  Bilateral lower extremity swelling.   HISTORY:  Edema:  Yes.  Trauma/Surgery:  No.  Pain:  No.  PE:  No.  Previous DVT:  No.  Anticoagulants:  Yes.  Other:   DUPLEX EXAM:                CFV   SFV   PopV  PTV    GSV                R  L  R  L  R  L  R   L  R  L  Thrombosis    o  o  o  o  o  o  o   o  o  o  Spontaneous   +  +  +  +  +  +  +   +  +  +  Phasic        +  +  +  +  +  +  +   +  +  +  Augmentation  +  +  +  +  +  +  +   +  +  +  Compressible  +  +  +  +  +  +  +   +  +  +  Competent     +  +  +  +  +  +  +   +  +  +   Legend:  + - yes  o - no  p - partial  D - decreased   IMPRESSION:  Bilateral lower extremity deep veins appear normal, without  evidence of deep venous thrombosis.    _____________________________  Janetta Hora Fields, MD   EM/MEDQ  D:  05/21/2010  T:  05/21/2010  Job:  [id number]

## 2011-01-19 NOTE — Discharge Summary (Signed)
[name], [name]:  [id number]   MEDICAL RECORD NO.:  [id number]          PATIENT TYPE:  INP   LOCATION:  2033                         FACILITY:  [facility]   PHYSICIAN:  Cassell Clement, M.D. DATE OF BIRTH:  1933-12-12   DATE OF ADMISSION:  12/16/2007  DATE OF DISCHARGE:  12/19/2007                               DISCHARGE SUMMARY   FINAL DIAGNOSES:  1. Paroxysmal atrial fibrillation, resolved.  2. Status post right total hip replacement, January 2009.  3. Status post cholecystectomy.  4. Status post nephrectomy for transitional cell carcinoma.  5. Status post hysterectomy for uterine cancer.  6. Essential hypertension.  7. Hyperlipidemia.  8. Hypothyroidism.  9. Depression.  10.Open heart surgery for patent ductus at age 89.  11.Mild-to-moderate aortic regurgitation by echocardiogram in October      2008 with normal ejection fraction of 55%.  12.Cardiac catheterization in October 2008 showing small branch vessel      disease but no large vessel obstructive disease.  13.Restless legs syndrome.  14.Dyspepsia followed by Dr. Tenna Child and responding to      erythromycin orally.   OPERATIONS PERFORMED:  None.   HISTORY OF PRESENT ILLNESS:  This is a 77 year old Caucasian female was  in her usual state of health until 5 p.m. on December 16, 2007 when she  noted an irregular heart beat and mild chest tightness.  The symptoms  were similar to her atrial fibrillation episode in October 2008.  She  has also noted some increased dyspnea.  She has generally not been  feeling well for several weeks with poor appetite and malaise and  fatigue and other nonspecific symptoms but no actual awareness of atrial  fib until the day of admission.   PHYSICAL EXAMINATION:  VITAL SIGNS:  On admission showed a pulse of 118  initially and then repeated was 131 with respirations of 16 and initial  blood pressure 174/121 which on repeat was 158/85, and O2 sat was 96% on  room  air.  HEART:  No murmur.  LUNGS: Clear.  ABDOMEN:  Soft.  EXTREMITIES:  Good pulses.  No phlebitis or edema.   HOSPITAL COURSE:  In the emergency room, her chest x-ray showed  borderline cardiomegaly but no active pulmonary disease.  Her  electrocardiogram confirmed atrial fib at a rapid rate with nonspecific  ST-T wave changes.  Her white count was 6700 with hemoglobin of 13.1.  BUN was 14, creatinine 0.19, B natriuretic peptide 580, and troponin  less than 0.05.   The patient was admitted to step-down unit.  She was given therapeutic  Lovenox and Coumadin was started.  She was also placed on a diltiazem  drip.  The patient remained in atrial fibrillation until the afternoon  of December 18, 2007 at which time she spontaneously converted back to  normal sinus rhythm while on the Cardizem drip.  Cardizem drip was  subsequently stopped and she has remained in normal sinus rhythm  overnight.  Her INR today is therapeutic at 2.0.   Her blood pressure today is 122/48, pulse is 51, and sinus bradycardia.  She  is feeling well.  She is ambulated without difficulty.  O2 sats  remain normal and she is ready to be discharged home.   ACCESSORY LABORATORY DATA:  On followup, B natriuretic peptide is still  elevated at 448 on the morning of discharge.  Fibrin D-dimer derivatives  were elevated on admission at 0.84 and for that reason, the patient  underwent a ventilation/perfusion lung scan on December 17, 2007 which  showed a low probability for pulmonary embolus.  Because the patient was  being started on amiodarone, she also underwent pulmonary function  studies on December 18, 2007 which showed minimal obstructive airways  disease and minimal neuromuscular disease and normal diffusing capacity.  Her hemoglobin was 12.5, white count 66,100.  Electrolytes were normal  with potassium 4.1.  BUN 13 and creatinine 1.11.  Liver function studies  were normal.  Albumin was 2.9.  Cardiac enzymes were negative  x3 for  myocardial damage.   DIET:  She will be on a low-cholesterol diet.   DISCHARGE MEDICATIONS:  The patient is being discharged improved on the  following regimen;  1. She will be on aspirin 81 mg one daily.  2. Atenolol 100 mg one and a half twice a day.  3. Hyzaar 100/25 one daily.  4. Lipitor 80 mg a half tablet every other day.  5. Synthroid 100 mcg daily.  6. Zoloft 50 mg daily.  7. Lyrica 75 mg daily for neuropathy.  8. Klor-Con 20 mEq daily.  9. Erythromycin 500 mg one twice a day for stomach.  10.Calcium with vitamin D once a day.  11.Vitamin D three times a day.  12.Clonazepam 0.5 mg at bedtime for sleep.  13.Amiodarone 200 mg one twice a day for heart.  14.Warfarin 2 mg one daily or as directed.   The patient appears to be relatively sensitive to Coumadin so we are  starting on a low-dose.  She will be seen back in the office in 3 days  on Friday morning, December 22, 2007 at 10:00 a.m. for an office visit  protime and EKG.   CONDITION ON DISCHARGE:  Improved.           ______________________________  Cassell Clement, M.D.     TB/MEDQ  D:  12/19/2007  T:  12/19/2007  Job:  [id number]   cc:   Lucky Cowboy, M.D.

## 2011-01-19 NOTE — Discharge Summary (Signed)
[name], [name]:  [id number]   MEDICAL RECORD NO.:  [id number]          PATIENT TYPE:  INP   LOCATION:  1602                         [facility]ACILITY:  [facility]   PHYSICIAN:  Madlyn Frankel. Charlann Boxer, M.D.  DATE OF BIRTH:  [birth year]   DATE OF ADMISSION:  10/10/2007  DATE OF DISCHARGE:  10/13/2007                               DISCHARGE SUMMARY   ADMITTING DIAGNOSES:  1. Osteoarthritis.  2. Hypertension.  3. Atrial fibrillation.  4. Dyslipidemia.  5. Gastroesophageal reflux disease.  6. Fibrocystic breast disease.  7. History of cancer of uterus and left kidney.   DISCHARGE DIAGNOSES:  1. Osteoarthritis.  2. Hypertension.  3. Atrial fibrillation.  4. Dyslipidemia  5. Gastroesophageal reflux disease.  6. Fibrocystic breast disease.  7. History of cancer of the uterus and left kidney.   HISTORY OF PRESENT ILLNESS:  A 77 year old female with history of right  hip pain secondary to osteoarthritis refractory to all conservative  treatment.   CONSULTS:  None.   PROCEDURE:  Right total hip replacement by surgeon Dr. Durene Romans.  Assistant Dwyane Luo, PA-C.   COMPONENTS USED:  A metal on poly plastic components.   LABS:  Preadmission CBC:  Hematocrit 37.2, at discharge 27.6, and  platelets 146,000.  White cell differential normal.  Coags normal.  Routine chemistry:  Sodium at admission 146, glucose 107.  At discharge,  sodium 140, potassium 4.2, glucose 97, creatinine 0.86.  Kidney  function:  GFR greater than 60, calcium 8.2.  UA negative.   RADIOLOGY:  Portable pelvis showed a right total hip arthroplasty,  normal anatomic alignment.  No current chest x-ray found in chart.   CARDIOLOGY:  Pre-cardiology clearance showed normal stress nuclear  study, no ischemia, normal left ventricular systolic function.   HOSPITAL COURSE:  The patient underwent right total hip replacement,  tolerated procedure well.  Was admitted to the orthopedic floor.  Postop  day #1,  doing okay, febrile.  Labs look good.  Her right hip was dry.  Hemovac was discontinued.  Neurovascularly intact.  She was partial  weightbearing with the knee immobilizer at all times due to his knee  history.  Initial PT recommended home health care.  This was selected.   Postop day #2, did well with physical therapy, ambulated 95 feet.  No  complaints, afebrile, hemodynamically stable.  Dressing was clean, dry  and intact, neurovascularly intact right lower extremity.  She continued  be partial weightbearing 25-50%.  Hep-Locked her IV.  Plan is to  discharge her the next day.   Seen on Friday, no events, afebrile.  Dressing was great.  Wound without  drainage.  Neurovascularly intact and continued PT/OT.  Will discharge  home health care PT and Lovenox teaching.   DISCHARGE DISPOSITION:  Discharged home in stable and improved  condition, home health care PT.   DISCHARGE DIET:  Regular.   DISCHARGE WOUND CARE:  Keep dry.   DISCHARGE PHYSICAL THERAPY:  Partial weightbearing 25-50%.   DISCHARGE MEDICATIONS:  Lovenox, Robaxin, Vicodin, iron, aspirin,  Colace, MiraLax.   HOME MEDICATIONS:  1. Clonazepam 0.5 mg  p.o. daily.  2. Lipitor 80 mg half tab p.o. every other day.  3. Lyrica 75 mg p.o. daily.  4. Sertraline 50 mg p.o. daily.  5. Klor-Con 20 mEq p.o. daily.  6. Atenolol 100 mg 1-1/2 tabs 2 tablets a day.  7. Hyzaar 100/25 one p.o. daily.  8. Synthroid 100 mcg p.o. daily.  9. Erythromycin 500 mg one p.o. q.a.m. and one p.o. q.p.m.  10.Vitamin E 1000 units daily.  11.Super B complex p.o. daily.  12.Vitamin D 3 2000 units three times a day.  13.Omega-3 fish oil 1000 mg p.o. daily.  14.Calcium plus D daily.  15.Vitamin B12 1000 mcg p.o. daily.   DISCHARGE FOLLOW-UP:  With Dr. Charlann Boxer, phone number [phone], in 10-14  days.     ______________________________  [name], [place]      Madlyn Frankel. Charlann Boxer, M.D.  Electronically Signed   BLM/MEDQ  D:  12/05/2007  T:   12/05/2007  Job:  [id number]

## 2011-01-19 NOTE — H&P (Signed)
[name], [name]:  [id number]   MEDICAL RECORD NO.:  [id number]          PATIENT TYPE:  INP   LOCATION:  NA                           FACILITY:  [facility]   PHYSICIAN:  [name], M.D.  DATE OF BIRTH:  May 25, 1934   DATE OF ADMISSION:  10/10/2007  DATE OF DISCHARGE:                              HISTORY & PHYSICAL   PROCEDURE:  Right total hip replacement with blood cell saver.   CHIEF COMPLAINTS:  Right hip and groin pain.   HISTORY OF PRESENT ILLNESS:  This is a 77 year old female with a history  of right hip pain secondary to osteoarthritis refractory to all  conservative treatment.  She has been presurgically assessed and cleared  by Dr. Oneta Rack.  She is a TE[employer] teacher [employer], but will accept cell saver  which has been called and scheduled for surgery.   PAST MEDICAL HISTORY:  1. Osteoarthritis.  2. Hypertension.  3. Atrial fibrillation.  4. Dyslipidemia.  5. GERD.  6. Fibrocystic breast disease.  7. History of cancer of uterus and left kidney.   PAST SURGICAL HISTORY:  1. Cholecystectomy.  2. Hysterectomy.  3. Nephrectomy.   SOCIAL HISTORY:  Patient is married.  Primary caregiver after surgery  will be family.   FAMILY MEDICAL HISTORY:  Heart disease, cancer, stroke.   DRUG ALLERGIES:  1. CODEINE.  2. ZINC.   MEDICATIONS:  1. Atenolol 100 mg 1.5 tabs p.o. b.i.d.  2. Zoloft 50 mg p.o. daily.  3. Synthroid 100 mg p.o. daily.  4. Erythromycin 500 mg p.o. daily.  5. Hyzaar 100/25 1 p.o. daily.  6. Lyrica 75 mg 1 p.o. daily.  7. Klor-Con 20 mEq 1 p.o. daily.  8. Clonazepam 0.5 mg 1 p.o. daily.  9. Lipitor 80 mg half-tablet every other day.  10.Vitamin D 3000 units daily.  11.Calcium plus D daily.  12.Vitamin E daily.  13.Nitrostat p.r.n.  14.Multivitamin daily.   REVIEW OF SYSTEMS:  None other than HPI.   PHYSICAL EXAMINATION:  VITAL SIGNS:  Pulse 60, respirations 18, blood  pressure 152/74.  GENERAL:  Awake, alert and  oriented, well-developed, well-nourished, no  acute distress.  NECK:  Supple.  No carotid bruit.  CHEST/LUNGS:  Clear to auscultation bilaterally.  BREASTS:  Deferred.  HEART:  Regular rate and rhythm.  S1-S2 distinct.  ABDOMEN:  Soft, nontender, nondistended.  Bowel sounds are present.  GENITOURINARY:  Deferred.  EXTREMITIES:  Right hip has increased pain with internal range of  motion.  Right knee has very limited motion due to previous infection as  a child.  Range of motion is really only about full extension maybe 20  degrees to 140, only about 30-40 degrees of flexion capability.  SKIN:  No cellulitis.  NEUROLOGIC:  Intact distal sensibilities.   LABORATORY DATA:  CBC:  Hemoglobin 14.5, hematocrit 44, platelets 212  preadmission.  Metabolic panel showed sodium 142, potassium 4.7, BUN 23,  creatinine 1.1, glucose 87.  Liver profile:  Alkaline phosphatase was 122, TSH was 0.946.  Other labs  pending presurgical testing.   DIAGNOSTICS:  1. Full cardiology stress test  workup.  2. ECG showed no diagnostic ST wave changes to suggest ischemia by      standard criteria.  The overall impression was a normal stress      nuclear study with no ischemia and normal LV systolic function.  3. Chest x-ray pending.   IMPRESSION:  Right hip osteoarthritis.   PLAN:  Plan of action is a right total hip arthroplasty with blood cell  saver with the patient's permission.  Date of surgery is October 10, 2007 at [facility] by surgeon Dr. Durene Romans.  Risks and  complications were  discussed.  Postoperative medications including Lovenox, Robaxin, iron,  aspirin, Colace, MiraLax are provided at time of history and physical.  Postoperative pain medicines will be provided at time of surgery.  The  patient is planning on rehabilitating with home health care at home.     ______________________________  [name], [place]      [name], M.D.  Electronically Signed     BLM/MEDQ  D:  10/06/2007  T:  10/06/2007  Job:  [id number]   cc:   Lucky Cowboy, M.D.  Fax: [phone]   Cassell Clement, M.D.  Fax: [phone]

## 2011-01-19 NOTE — Assessment & Plan Note (Signed)
OFFICE VISIT   [name], Valerie L  DOB:  Aug 22, 1934                                       05/21/2010  QIONG#:[id number]   CHIEF COMPLAINT:  Leg swelling.   HISTORY OF PRESENT ILLNESS:  The patient is a 77 year old female  referred by Dr. Oneta Rack for evaluation of leg swelling.  The patient  states that the leg swelling has been present for approximately a year.  She states that it has improved significantly over the last 2 months  after being prescribed a diuretic by Dr. Patty Sermons.  She denies any  prior history of myocardial infarction or congestive heart failure.  She  does have a history of aortic stenosis and atrial fibrillation.  She  also states that she has some weakness in her legs after walking  approximately 30 feet.  She does not really describe claudication  symptoms but just overall generalized fatigue.  She also has  intermittent episodes of dyspnea and these usually occur at rest or with  exercise and are fairly random in nature.   Chronic medical problems include her atrial fibrillation and  hypertension.  These are currently controlled.   PAST MEDICAL HISTORY:  Also significant for a left renal carcinoma and  uterine cancer.   PAST SURGICAL HISTORY:  Hysterectomy, left nephrectomy and right total  hip replacement.   SOCIAL HISTORY:  She is married.  She is a former smoker but she quit  many years ago.  She does not consume alcohol regularly.   REVIEW OF SYSTEMS:  Full 12 point review of systems was performed with  the patient today.  Please see intake referral form for details  regarding this.   MEDICATIONS:  Include atenolol, Hyzaar, Zoloft, Synthroid, amiodarone,  potassium, erythromycin, aspirin, Lyrica, Tekturna, warfarin, Lipitor,  magnesium, vitamin D, vitamin B12, vitamin B complex, Fish Oil, calcium  and Lasix.   She has allergies listed to codeine and zinc, which cause nausea and  vomiting.   PHYSICAL EXAM:  Vital  signs:  Blood pressure is 162/77 in the left arm,  heart rate 48 and regular, respirations 16, oxygen saturations 98% on  room air.  HEENT:  Unremarkable.  Neck:  Has 2+ carotid pulses without  bruit.  Chest:  Clear to auscultation without rales or wheezing.  Cardiac:  Regular rate and rhythm without murmur.  Abdomen: Soft,  nontender, nondistended.  No masses.  Musculoskeletal:  Exam shows no  major joint deformities.  Extremities:  She has 2+ radial, femoral and  posterior tibial pulses bilaterally.  She has 1+ dorsalis pedis pulses  bilaterally.  Lower extremities show a few scattered varicosities on the  posterior calf bilaterally.  These are very small in nature.  Skin:  Has  no open ulcers or rashes.  Neurologic:  Shows no focal weakness in the  upper or lower extremities.   She had bilateral ABIs performed today which were normal and >1 with  triphasic waveforms bilaterally.  She also had a venous duplex exam  which showed no evidence of DVT bilaterally.   In summary, the patient has a history of lower extremity swelling which  has been improved with diuretics.  She had a normal arterial and venous  study today.  I do not believe her lower extremity fatigue or her  swelling are vascular in nature.  I believe the best  option for her  would be continued use of her dieretic.  It seems like her swelling may  be related more so to possibly some element of congestive failure.  I  will leave further workup at the discretion of Dr. Oneta Rack and Dr.  Patty Sermons.  I did counsel her that she could benefit from some  compression stockings for symptomatic relief of her lower extremity  swelling when she is having difficulties.  She will follow up with me on  an as-needed basis.     Janetta Hora. Fields, MD  Electronically Signed   CEF/MEDQ  D:  05/21/2010  T:  05/22/2010  Job:  [id number]   cc:   Lucky Cowboy, M.D.  Cassell Clement, M.D.

## 2011-01-19 NOTE — Discharge Summary (Signed)
[name], [name]:  [id number]   MEDICAL RECORD NO.:  [id number]          PATIENT TYPE:  INP   LOCATION:  6525                         FACILITY:  [facility]   PHYSICIAN:  Cassell Clement, M.D. DATE OF BIRTH:  01-20-1934   DATE OF ADMISSION:  06/09/2007  DATE OF DISCHARGE:  06/13/2007                               DISCHARGE SUMMARY   FINAL DIAGNOSES:  1. Paroxysmal atrial fibrillation, resolved.  2. Chest pain.  3. Small vessel coronary artery disease by cardiac catheterization.  4. Mild aortic insufficiency.   OPERATION PERFORMED:  1. 2-D echocardiogram.  2. Left heart diagnostic cardiac catheterization by Dr. Lady Deutscher      on June 12, 2007.   HISTORY:  This pleasant 77 year old Caucasian female was admitted as an  emergency by Dr. Joaquim Lai with new onset of rapid atrial  fibrillation.  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  She also has past  history of uterine cancer and transitional cell cancer of the bladder  and is status post cholecystectomy.  She was admitted through the  emergency room on June 09, 2007.  She was found to be in rapid atrial  fibrillation.  She was begun on diltiazem drip and was placed on beta  blockers.  She was anticoagulated with IV heparin.  Serial enzymes  showed [name] mild bump in her troponin, felt to be secondary to rapid atrial  fibrillation.  However, because of multiple risk factors for coronary  disease, we did feel that she should undergo catheterization.  On the  day after admission, the patient still had rapid atrial fibrillation,  and her Cardizem drip was increased from 10 to 15 mg per hour.  She  converted to normal sinus rhythm later that day.  Troponins were 0.13,  0.09, 0.05, and 0.05.  EKG showed  no acute ST-T wave changes.  She  remained stable and on June 12, 2007 underwent cardiac catheterization  by Dr. Lady Deutscher who found that she had normal LV function with an  ejection fraction of 60%, and she had mild ascending aortic root  dilatation with trivial aortic insufficiency, and she was found to have  small vessel disease not amenable to percutaneous intervention.  It was  felt that the patient would do well with aggressive risk factor  modification.  She had no post cath complications and was able to be  discharged the following morning.   DIET:  She will be on [name] low-sodium, heart-healthy diet.   ACTIVITY:  She is to increase her activity slowly.  She may return to  work on June 19, 2007.   FOLLOW UP:  She is to see Dr. Patty Sermons in followup in 2-3 weeks for an  office visit, followup EKG, and fasting lipids.  She is to follow up  with Dr. Oneta Rack as directed.   DISCHARGE MEDICATIONS:  1. Aspirin 81 mg daily.  2. Hyzaar 100/25 one  daily.  3. K-Dur 20 mEq daily.  4. Klonopin 0.5 mg at bedtime.  5. Lyrica 75 mg daily.  6. Multivitamin one daily.  7. Synthroid 100 mcg daily.  8. Atenolol 100 mg in the morning and 50 mg in the evening.  9. Zoloft 100 mg daily.  10.Nitrostat 1/150 sublingually p.r.n. for chest pain.  11.Lipitor 20 mg daily.   CONDITION ON DISCHARGE:  Improved.           ______________________________  Cassell Clement, M.D.     TB/MEDQ  D:  06/13/2007  T:  06/13/2007  Job:  [id number]   cc:   Elmore Guise., M.D.  Lucky Cowboy, M.D.

## 2011-01-19 NOTE — H&P (Signed)
NAMEJANISA, [name]:  [id number]   MEDICAL RECORD NO.:  [id number]          PATIENT TYPE:  INP   LOCATION:  2928                         FACILITY:  [facility]   PHYSICIAN:  Valerie Faith, MD   DATE OF BIRTH:  Nov 29, 1933   DATE OF ADMISSION:  12/16/2007  DATE OF DISCHARGE:                              HISTORY & PHYSICAL   PRIMARY CARE PHYSICIAN:  Dr. Oneta [name]   CHIEF COMPLAINT:  Racing heart.   HISTORY OF PRESENT ILLNESS:  This is a 77 year old white female who was  in her usual state of health until 5:00 p.m. on the day of admission  when she became suddenly aware of racing heart and irregular heartbeat.  This is identical to her atrial fibrillation in October 2008.  This was  also accompanied by a mild chest tightness which is now resolved with  improved rate control.  The patient has chronic dyspnea on exertion  which has been worse recently.  She has also a complaint of a poor  appetite for the past several weeks and generally feeling subpar,  although she has a hard time being more specific.  She denies stroke or  TIA symptoms.  No chest pain prior to today.   PAST MEDICAL HISTORY:  1. Paroxysmal atrial fibrillation, October 2008, required admission to      the hospital and then converted with rate controlling medicines.  2. Status post right total hip replacement January 2009.  3. Status post cholecystectomy.  4. Status post nephrectomy for transitional cell carcinoma.  5. Status post hysterectomy for uterine cancer.  6. Hypertension.  7. Hyperlipidemia.  8. Hypothyroidism.  9. Depression.  10.Open heart surgery for PDA at age 68.  11.Echocardiogram in October 2008, shows ejection fraction of 55% with      mild-to-moderate AR.  12.Catheterization in October 2008, shows small branch vessel disease      with no obstructive LAD, circumflex RCA lesion.  There is an      obstructive first septal perforator lesion.   SOCIAL HISTORY:  Lives in [place]  with her husband.  She is a nonsmoker  and nondrinker.   FAMILY HISTORY:  Mother died of intestinal cancer.  Father died of colon  cancer.   ALLERGIES:  CODEINE and ZINC.   MEDICINES:  1. Aspirin 81 mg p.o. q. day.  2. Atenolol 150 mg p.o. b.i.d.  3. Hyzaar 100 mg p.o. q. day.  4. HCTZ, doses unknown.  5. Lipitor 20 mg p.o. q. day.  6. Synthroid 100 mcg p.o. q. day.  7. Zoloft 100 mg p.o. q. day.  8. Lyrica 75 mg p.o. q. day.  9. Potassium chloride 20 mEq p.o. q. day.  10.Erythromycin 500 mg p.o. b.i.d.  11.Calcium daily.  12.Klonopin 0.5 mg p.o. q.h.s.  13.Multivitamin daily.   REVIEW OF SYSTEMS:  Positive for poor appetite.  Positive today for  palpitations and chest pain.  Positive over the past few weeks shortness  of breath and dyspnea on exertion.  Positive also for transient right  lower extremity swelling after total hip replacement 2 months ago.  This  resolved after 1 day.   PHYSICAL EXAMINATION:  VITAL SIGNS:  Temperature 96.8, pulse 118  initially, repeat 131, respiratory rate 16, blood pressure initially  174/121 on repeat was 158/85, and saturation 96% on room air.  GENERAL:  This is an older white female in no acute distress.  She is  comfortable.  HEENT:  Pupils equal, round, and reactive.  Sclerae are clear.  Mucous  membranes are moist.  Dentition is good.  NECK:  Supple.  Neck veins are flat.  No carotid bruits.  No cervical  lymphadenopathy.  No thyromegaly.  CARDIAC:  Irregularly irregular rhythm, tachycardiac rate, no murmurs.  A 2+ dorsalis pedis and radial pulses bilaterally.  LUNGS:  Clear to auscultation bilaterally without wheezing or rales.  ABDOMEN:  Soft, nontender, and nondistended.  Normal bowel sounds.  EXTREMITIES:  No edema.  No rash.  MUSCULOSKELETAL:  No joint effusions or tenderness.  NEUROLOGIC:  A 5/5 strength in all 4 extremities, awake, alert, and  oriented x3.   DIAGNOSTIC TESTS:  Chest x-ray shows borderline cardiomegaly with  no  active pulmonary disease.  Electrocardiogram shows atrial fibrillation  at rate of 122 beats per minute with nonspecific ST and T-wave changes.  The rhythm was sinus rhythm on the most recent EKG prior to this.  Labs,  white blood cell 6.7, hemoglobin 13.1, and platelets 165.  Sodium 144,  potassium 3.6, BUN 14, creatinine 0.19, BNP 580, point of care, troponin  less than 0.05.  INR 1.   IMPRESSION:  A 77 year old white female with paroxysmal atrial  fibrillation and rapid ventricular response with mild chest tightness in  the setting of tachycardia.   PLAN:  1. Admit to step-down unit for diltiazem drip.  We will give diltiazem      bolus in the emergency room, anticoagulate with therapeutic Lovenox      and we will start Coumadin,  plan for a possible TEE cardioversion      on this admission.  We will continue atenolol being careful not to      slow the heart rate too much.  2. Check TSH.  3. Given her transient lower extremity swelling and shortness of      breath, post hip replacement we will check a D-dimer and if      positive we will check a VQ scan.  4. Continue her antihypertensive and high cholesterol medicines.      Valerie Faith, MD  Electronically Signed     NDL/MEDQ  D:  12/16/2007  T:  12/17/2007  Job:  [id number]

## 2011-01-19 NOTE — H&P (Signed)
[name], [name]:  [id number]   MEDICAL RECORD NO.:  [id number]          PATIENT TYPE:  INP   LOCATION:  0107                         FACILITY:  [facility]   PHYSICIAN:  Elliot Cousin, M.D.    DATE OF BIRTH:  [birth year]   DATE OF ADMISSION:  03/21/2007  DATE OF DISCHARGE:                              HISTORY & PHYSICAL   PRIMARY CARE PHYSICIAN:  Dr. Lucky Cowboy   CHIEF COMPLAINT:  I felt like I was going to pass out.   HISTORY OF PRESENT ILLNESS:  The patient is a 77 year old woman with a  past medical history significant for hypertension, hyperlipidemia, and  hypothyroidism.  She presents to the emergency department with a chief  complaint of almost having passed out.  The patient states that she got  up this morning in her usual state of health.  Her husband is currently  hospitalized at [facility].  This morning she came to visit  him.  On her way into the hospital, she began to feel short of breath,  tired, and weak.  She sat down for a few minutes and recovered.  When  she got back up, she proceeded into the hospital.  When she got up to  her husband's room she felt lightheaded again, as if she was going to  pass out.  This occurred when she stood up.  She also felt as if her  legs were going to give away.  She asked one of the nursing techs to  take her blood pressure.  According to the patient, her systolic blood  pressure was in the 60s or 70s.  At that time, the patient was  admonished to come to the emergency department for further evaluation.   Over the past several days, the patient has had no complaints of nausea,  vomiting, chest pain, shortness of breath, cough, abdominal pain, or  pain with urination.  She denies palpitations or swelling in her legs.  She has had some loose stools for the past 2 days but she states that  this is not unusual for her.  She usually has 3-4 loose stools daily.  She denies any recent bright red  blood per rectum or black tarry stools.  She denies fever, chills and headache.  She has not had any changes in  her medications lately.   During the evaluation in the emergency department, the patient was  initially found to be hypotensive with a blood pressure of 88/48.  She  was also found to be bradycardic with a heart rate of 45.  Her EKG  revealed marked sinus bradycardia, nonspecific T-wave abnormalities, and  a prolonged QT interval.  Her lab data are currently significant for a  serum potassium of 3.1, BUN of 25, and creatinine of 1.49.  The patient  will be admitted for further evaluation and management.   PAST MEDICAL HISTORY:  1. Hypertension.  2. Hyperlipidemia.  3. Hypothyroidism.  4. Depression with anxiety.  5. Restless legs syndrome.  6. Neuropathy of the lower extremities bilaterally.  Etiology unclear.  7. History of uterine cancer  approximately 10 or 15 years ago status      post radiation therapy and hysterectomy.  8. Transitional cell carcinoma of the left kidney status post      nephrectomy in 2003.  9. Status post cholecystectomy in the past.  10.Status post cardiac surgery at 12 years ago for a congenital      defect.  11.Status post right knee surgery secondary to osteomyelitis at 77      years old.  12.Gastroparesis and an edematous polyp of the stomach per EGD by Dr.      Arlyce Dice in April 2008.   MEDICATIONS:  1. Aspirin 81 mg daily.  2. Atenolol 100 mg daily.  3. Calcium 600 mg daily.  4. Vitamin D one tablet daily.  5. Hyzaar 100 mg daily.  6. Klonopin 0.5 mg at bedtime.  7. Lipitor 20 mg half a tablet every-other day.  8. Multivitamin once daily.  9. Synthroid 100 mcg daily.  10.Verapamil 240 mg daily.  11.Zoloft 100 mg daily.  12.Doxazosin 8 mg q.h.s.   ALLERGIES:  The patient has allergies to CODEINE and ZINC.   SOCIAL HISTORY:  The patient is married.  She lives in [place],  [place], with her husband.  She has three  daughters.  She is  currently employed as a Solicitor.  She denies tobacco, alcohol and illicit  drug use.   FAMILY HISTORY:  Her mother died of duodenal cancer.  Her father died of  colon cancer.   REVIEW OF SYSTEMS:  The patient's review of systems is otherwise  negative.   EXAMINATION:  Temperature 97.8, blood pressure 88/48, now repeated at  112/44, pulse 51, respiratory rate 20, oxygen saturation 99% on room  air.  GENERAL:  The patient is a pleasant, alert, 77 year old elderly  Caucasian woman who is currently lying in bed in no acute distress.  HEENT:  Head is normocephalic, nontraumatic.  Pupils equal, round, and  reactive to light.  Extraocular movements are intact.  Conjunctivae are  clear, sclerae are white.  Ears:  Hearing aids are in place.  Nasal  mucosa is mildly dry.  No sinus tenderness.  Oropharynx reveals partial  dentures.  Mucous membranes are mildly dry.  No posterior exudates or  erythema.  NECK:  Supple, no adenopathy, no thyromegaly, no JVD.  There is a left-  sided bruit.  LUNGS:  Clear to auscultation bilaterally.  HEART:  S1, S2 with a soft systolic murmur and bradycardia.  CHEST WALL:  Well-healed small sternotomy scar.  ABDOMEN:  Multiple well-healed scars.  Mildly obese, positive bowel  sounds, soft, nontender, nondistended.  No hepatosplenomegaly, no masses  palpated.  EXTREMITIES:  Pedal pulses are 2+ bilaterally.  No pretibial edema and  no pedal edema.  NEUROLOGIC:  The patient is alert and oriented x3.  Cranial nerves II-  XII are intact.  Strength is 5/5 throughout.  Sensation is grossly  intact.   ADMISSION LABORATORIES:  EKG reveals marked sinus bradycardia with AV  dissociation, nonspecific T-wave abnormality, prolonged QT interval  (586), heart rate 45 beats per minute.  EKG #2 reveals sinus bradycardia  with a heart rate of 56 beats per minute, nonspecific T-wave  abnormalities, prolonged QT interval of 540.  Chest x-ray pending.  WBC   7.1, hemoglobin 13.0, platelets 231.  Sodium 136, potassium 3.1,  chloride 105, CO2 22, glucose 137, BUN 25, creatinine 1.49, calcium 8.3.  Myoglobin 85.8, CK-MB less than 1, troponin I less than 0.05.   ASSESSMENT:  1. Presyncope.  The differential diagnoses include orthostatic      hypotension, hypotension secondary to antihypertensive medications,      arrhythmia including bradycardia, hypokalemia, carotid artery      disease, and/or underlying occult infection.  More than likely, the      patient became presyncopal secondary to hypotension and perhaps      marked bradycardia.  The patient has chronically loose stools but      not out of the ordinary for her today.  The hypotension could be      secondary to the multiple antihypertensive medications in the      setting of hypovolemia.  Given her age and renal insufficiency, it      is possible that the patient may not be  able to clear these      medications as effectively as she had done in the past.  Will also      keep in mind that the patient could also have carotid artery      disease as there is an audible left-sided carotid bruit.   1. Hypotension.  As indicated above, the hypotension could be      secondary to the multiple antihypertensive medications.  The      patient could also be volume depleted in the setting of blood      pressure treatment.   1. Renal insufficiency.  The patient's BUN is 25 and her creatinine is      1.5.  Baseline creatinine is unknown.  More than likely, the      patient is  prerenal from hypovolemia.   1. Hypokalemia.  The patient's serum potassium is 3.1.  Magnesium      deficiency will need to be ruled out.  She does take      hydrochlorothiazide as part of Hyzaar.  However,  the Cozaar should      counteract it..   1. Marked bradycardia and prolonged QT interval.  More than likely,      these are manifestations of atenolol and verapamil.   1. Left carotid bruit.  As above.   PLAN:  1.  The patient will be admitted for further evaluation and management.  2. She received a liter of normal saline in the emergency department.  3. Will continue IV fluid volume repletion with normal saline with      potassium chloride added.  4. Will hold the verapamil and doxazosin.  Will start atenolol at a      much smaller dose.  Will restart Hyzaar at a smaller dose as well.      The antihypertensive medications will be withheld for a blood      pressure of less than 120 systolic and a heart rate of less than      60.  5. For further evaluation, will check a TSH to rule out worsening      hypothyroidism.  Will check cardiac enzymes to rule out myocardial      infarction/ischemia.  Will check carotid Dopplers to rule out      stenosis.  Will check a 2-D echocardiogram to assess the patient's      LV function.  6. Will check a magnesium level to rule out deficiency.  The patient's      potassium chloride will be repleted orally and in the IV fluids.  7. Will check an urinalysis to rule out underlying infection.      Elliot Cousin, M.D.  Electronically Signed  DF/MEDQ  D:  03/21/2007  T:  03/21/2007  Job:  [id number]   cc:   Lucky Cowboy, M.D.  Fax: [phone]

## 2011-01-19 NOTE — H&P (Signed)
[name], [name]:  [id number]   MEDICAL RECORD NO.:  [id number]          PATIENT TYPE:  INP   LOCATION:  2919                         FACILITY:  [facility]   PHYSICIAN:  Rod Holler, MD     DATE OF BIRTH:  07/21/1934   DATE OF ADMISSION:  06/09/2007  DATE OF DISCHARGE:                              HISTORY & PHYSICAL   CHIEF COMPLAINT:  Palpitations.   HISTORY OF PRESENT ILLNESS:  This afternoon very pleasant 77 year old  female with a history of hypertension, hyperlipidemia, hypothyroidism  who presented to emergency department via EMS with complaints of  palpitations.  About 6 o'clock tonight, the patient had sudden onset of  palpitations, fast heart rhythm, feeling like her heart was going to  jump out of her chest.  There was associated chest tightness and  shortness of breath.  She has never had this before.  There is no  associated nausea or diaphoresis.  She has had no recent anginal type  symptoms, no recent heart failure symptoms, no PND or orthopnea, no  lower extremity swelling.  In the emergency department, she was noted to  be in atrial fibrillation with a rapid ventricular response and was  given diltiazem 10 mg bolus x2, and started on a diltiazem drip.  Currently, she is feeling somewhat better, but still has feelings of  fast heart rhythm.   PAST MEDICAL HISTORY:  1. Hypertension.  2. Hyperlipidemia  3. Hypothyroidism.  4. Depression.  5. Restless leg syndrome.  6. Neuropathy.  7. History of a congenital heart defect status post repair at age 46,      sounds like a repair of a patent ductus arteriosus.  8. History of uterine cancer  9. History of transitional cell carcinoma  10.Status post cholecystectomy.   MEDICINES.:  1. Aspirin  81 mg p.o. daily.  2. Atenolol 100 mg p.o. daily  3. Hyzaar 100/ unknown dose hydrochlorothiazide daily.  4. Klonopin 0.5 mg p.o. q.h.s.  5. Lipitor 20 mg p.o. daily.  6. Multivitamin 1 tablet p.o.  daily  7. Synthroid 100 mcg p.o. daily.  8. Verapamil 240 mg p.o. daily  9. Zoloft 100 mg p.o. daily.  10.Doxazosin 8 mg p.o. daily  11.Lyrica 75 mg p.o. daily.   ALLERGIES:  1. CODEINE  2. ZINC.   SOCIAL HISTORY:  The patient is married, denies tobacco use.   FAMILY HISTORY:  Father died of colon cancer, mother died of duodenal  cancer, both with coronary artery disease in their 50's to 4's.   REVIEW OF SYSTEMS:  All systems reviewed in detail and negative except  as noted in history of present illness.   PHYSICAL EXAM:  Vital Signs: Heart rate in 130s, respiratory rate 22,  oxygen saturation 97%.  Temperature 97.4, blood pressure 187/106.  GENERAL:  Well-developed, well-nourished female, alert and oriented x3,  no apparent distress.  HEENT: Atraumatic, normocephalic, pupils equal, round, and react to  light, extraocular movements intact.  NECK:  Supple.  No adenopathy, no JVD.  CHEST:  Lungs with faint bibasilar crackles, otherwise clear to  auscultation bilaterally.  CORONARY irregularly irregular, tachycardic,  no murmurs, rubs or gallops, 2+ peripheral pulses.  ABDOMEN: Soft, nontender, nondistended.  Active bowel sounds.  EXTREMITIES: No clubbing, cyanosis or edema.  NEUROLOGIC: No focal deficits.  EKG shows atrial fibrillation, RVR,  diffuse ST and T-wave changes.   LABS:  White blood cell count 8.5, hematocrit 43, platelet count 232.  Sodium 145, potassium 3.1, chloride 108, bicarb 26, BUN 14, creatinine  0.9, glucose 111, myoglobin 106, troponin  0.05, CK-MB of 2.   IMPRESSION AND PLAN:  77 year old female who presents with new-onset  atrial fibrillation, RVR.   PLAN:  1. Cardiovascular: Admit the patient to a step-down unit, aspirin      daily, Lipitor daily, continue ARB and beta blocker, continue      verapamil, continue diltiazem drip that started in the emergency      department and titrate to heart rate less than 80, transthoracic      echocardiogram,  serial cardiac enzymes, thyroid function tests,      daily EKG, heparin bolus and drip.  2. GI:  Guaiac all stools, place some Protonix daily.  3. Fluids, electrolytes, nutrition:  Cardiac diet, replete potassium      and repeat the level in the morning.  4. Pulmonary: Follow-up chest x-ray.  5. CMP, CBC, magnesium level, thyroid function tests, BNP in the      morning.      Rod Holler, MD  Electronically Signed     TRK/MEDQ  D:  06/09/2007  T:  06/11/2007  Job:  [id number]

## 2011-01-19 NOTE — Op Note (Signed)
[name], BYE NO.:  [id number]   MEDICAL RECORD NO.:  [id number]          PATIENT TYPE:  INP   LOCATION:  1602                         FACILITY:  [facility]   PHYSICIAN:  Madlyn Frankel. Charlann Boxer, M.D.  DATE OF BIRTH:  May 30, 1934   DATE OF PROCEDURE:  10/10/2007  DATE OF DISCHARGE:                               OPERATIVE REPORT   PREOPERATIVE DIAGNOSIS:  Right hip osteoarthritis with a dysplastic  appearance of the femur and acetabulum.   POSTOPERATIVE DIAGNOSIS:  Right hip osteoarthritis with a dysplastic  appearance of the femur and acetabulum.   OPERATION PERFORMED:  Right total hip replacement using a 50 mm Pinnacle  cup and a single cancellous screw for the initial scratch fit, 50 x 32  plus 4 neutral Ultrex cup.  A size 2 standard Tri-Lock stem with a +32  +1 ball.   SURGEON:  Madlyn Frankel. Charlann Boxer, M.D.   ASSISTANT:  Yetta Glassman. Mann, PA   ANESTHESIA:  General.   BLOOD LOSS:  250 mL.   COMPLICATIONS:  None.   DRAINS:  x1.   INDICATIONS FOR PROCEDURE:  [name] is a 77 year old female with a  history of right hip pain.  Radiographs revealed dysplastic appearance  of her hip, persistent pain despite conservative measures including  injections failed to provide relief.  She was discussed surgical  intervention, we reviewed the risks and benefits of hip surgery.  She  had a complicating issue and previous remote history of problems with  the left lower extremity with infection and near fusion of the knee.   Risks associated with potential recurring infections and indolent source  versus complications related to her hip surgery from her knee fusion  were all reviewed prior to surgery, consent was obtained.   DESCRIPTION OF PROCEDURE:  The patient was brought to the operating  theater.  Once adequate anesthesia, preoperative antibiotics were  administered, the patient was positioned supine.  Initial examination  revealed she was a little bit short on this  right lower extremity which  we had reviewed with plans to try to lengthen if we could.  She is also  having a little bit of play in this right knee when she is asleep.   She was now positioned in the left lateral decubitus position with the  right side up.  The right lower extremity was then prepped and draped in  routine fashion following a prescrub.  A lateral based incision was made  down to the iliotibial band and gluteus fascia which was  incised  posteriorly.  The short external rotators were identified and taken down  separately from the posterior capsule.  The femoral head was removed.  The hip was dislocated with the use of bone hooks with entire right  lower extremity internal rotation due to the previously fused knee with  forced adduction.   I was able to remove the head and keep the hip internally rotated  through the lower extremity basically by manipulating the supracondylar  region of the femur.  The knee was maintained in a good position by my  assistant, Valerie Apple  Loreta [name], who was aware of her knee condition from preop.   Neck osteotomy was made.  Attention was first directed to the femur and  retractors placed and further debridement of the piriformis stump.  I  began with starting reamer, hand-held, and reamed once and then  irrigated.  I then broached up to a size 2.  I packed off the femur and  turned attention to the acetabulum.  Acetabular exposure obtained pretty  routinelywith retractor placement and labrectomy.  Patient noted to have  a hypertrophic labrum which was anterior into posterior capsule.  It was  opened in a capsulectomy as opposed to a capsulotomy. I reamed with a 43  reamer down to the medial wall.  I did not penetrate the medial wall.  I  had good coverage of the reamers anterior and posterior along the  column, the superior and posterior aspect of the hip was a little bit  more deficient.  I impacted a 50 mm cup at 40 degrees of abduction and  20  degrees of forward flexion positioned between the column and tried to  place a single screw into the ilium anteriorly.  I did have a sense that  this penetrated anterior cortex but it did help with initial stability.   I did not feel that it was going to cause problems with the gluteus  musculature.  At this point a trial liner was placed and we attempted  trial reduction.  Trial reduction was carried out with a 2 femur, 3.21  ball.  The hip was reduced.  Hip stability was not necessary concerned.  I was unable to test my normal parameters.  This was due to her  arthrodesed knee.  Trial components were removed, the final hole  eliminator was placed.  I irrigated and dried the cup and the final 50 x  32 +4 Ultrex cup was impacted.  This was a neutral cup.  The final 2  standard trial stem with Gription was then impacted at level of the neck  cut and thus a 32 +1 ball was impacted on.   The hip was reduced and again irrigated.  A medium Hemovac drain was  placed.  I was unable to reapproximate any soft tissue components; the  iliotibial band was closed with #1 Ethibond, the gluteus fascia with #1  Vicryl.  Remainder of the wound was closed in layers with 2-0 Vicryl and  running 4-0 Monocryl.  The patient was brought to the recovery room  extubated in stable condition.  She lost 250 mL of blood, is a [employer]  [employer] and received less than 100 mL back through Cell Saver blood that  she was preoperatively positioned to take.      Madlyn Frankel Charlann Boxer, M.D.  Electronically Signed     MDO/MEDQ  D:  10/10/2007  T:  10/11/2007  Job:  [id number]

## 2011-01-19 NOTE — Discharge Summary (Signed)
NAMEGENESEE, NASE              ACCOUNT NO.:  [id number]   MEDICAL RECORD NO.:  [id number]          PATIENT TYPE:  INP   LOCATION:  1409                         FACILITY:  [facility]   PHYSICIAN:  Mobolaji B. Bakare, M.D.DATE OF BIRTH:  08/03/1934   DATE OF ADMISSION:  03/21/2007  DATE OF DISCHARGE:  03/22/2007                               DISCHARGE SUMMARY   PRIMARY CARE PHYSICIAN:  Dr. Oneta Rack.   FINAL DIAGNOSES:  1. Syncope secondary to hypovolemia.  2. Prolonged QTC interval.  3. Sinus bradycardia.  4. Hypertension.  5. Azothemia .  6. Chronic diarrhea.   PROCEDURES:  1. 2D echocardiogram done on the 16th of July 2008 showed ejection      fraction of 60-65%, no wall motion abnormalities, mild diastolic      dysfunction, mild aortic valve regurgitation, mild mitral valve      regurgitation, mildly increased PA systolic pressure.  2. Carotid Dopplers showed no internal carotid artery stenosis.  3. Chest x-ray done on the 15th of July was suspicious for right lobe      pneumonia.  The followup chest x-ray PA and lateral was negative      for pneumonia   BRIEF HISTORY OF PRESENT ILLNESS:  Ms. Fennimore is a 77 year old  Caucasian female who resides at home with her family.  She presented to  the emergency room with feeling of wanting to pass out.  She apparently  has been having some diarrhea two days prior to this episode.  She has  history of chronic diarrhea which clears up intermittently but secondary  to radiation enteritis.  She had radiation for uterine cancer  approximately 10-15 years ago.   On arrival in the emergency room she had a blood pressure of 88/44.  She  was orthostatic.  The patient had an EKG which showed prolonged QTC  interval of 521.  She was in sinus bradycardia on that EKG with a heart  rate of 45.  The patient has been on verapamil 240 mg daily and atenolol  100 mg daily prior to hospitalization.  Additionally she was started on  doxazosin 8 mg  q.h.s.  This was started fairly recently in the last 2-3  weeks.   She was admitted for presyncope after she appeared with marked  bradycardia and prolonged QTC interval.   HOSPITAL COURSE:  1. Presyncope.  The patient responded to IV fluid, and after 24 hours      of IV fluid she was no longer orthostatic.  Blood pressure      improved. she subsequently  became hypertensive because most of her      hypertensive medications were held.  She was on telemetry.  There      was no malignant arrhythmia on telemetry.  She had sinus      bradycardia improved with holding verapamil.  Atenolol was reduced      to 12.5 b.i.d.  She had a 2D echocardiogram which showed no acute      abnormality.  She had a carotid Doppler which was negative for      carotid artery stenosis.  The patient was able to ambulate without      difficulty prior to discharge.  She was felt stable enough for      discharge.  Presyncope is felt to be secondary to hypovolemia      secondary to current chronic diarrhea.  2. Prolonged QTC interval.  We obtained an EKG from Dr. Jenene Slicker      office.  It showed QTC interval of 489 in January 2007.  The      patient has been on Zoloft for a very long time.  I suspect this is      why she has had these QTC intervals prolonged.  Magnesium was on      the low side of normal at 1.8.  Potassium was low at 3 at the time      of admission.  These were optimized.  She received potassium      chloride supplement.  Potassium at time of discharge was 4.  Zoloft      has been reduced to 75 mg daily.  Recommend followup EKG next visit      with Dr. Oneta Rack.  She was asymptomatic with this prolonged QTC      interval.  With correction of potassium and initiation of magnesium      supplements  , QTC interval at the time of discharge was 508 from      521 on admission.  It is noted that patient has hypokalemia on      admission, and she will go home on potassium supplement 10 mEq      daily and  follow up BMET in about a week.  3. Sinus bradycardia.  The patient has marked final bradycardia upon      admission, and this could have contributed to the presyncope.  She      was on a combination of verapamil and beta blocker.  Verapamil has      been discontinued, and beta blocker reduced to 12.5.  Heart rate on      telemetry monitor was ranging between 55 to 65 at the time of      discharge, although we have taken off some of our antihypertensive      medications, but it is noted that she came in hypotensive on      admission.  Further adjustment to antihypertensive will be made by      Dr. Oneta Rack .  4. Azotemia.  The patient had elevated BUN on admission, and she      responded well to hydration.  BUN and creatinine upon discharge      were 14/1.  5. Chronic diarrhea.  The patient reports chronic diarrhea which is      intermittent due to radiation enteritis.  Diarrhea resolved while      in hospital.  6. Hypertension.  Blood pressure during the course of hospitalization      improved with IV fluid and holding antihypertensive.  Actually she      became hypertensive with the blood pressure ranging between 160 to      170 systolic.  Hyzaar will be continued at 100 mg daily with      combination hydrochlorothiazide 25 mg daily, atenolol at 12.5 mg      b.i.d.  7. Doxazosin and verapamil have been discontinued secondary to      presyncope and marked bradycardia.  Further adjustment can be made      by Dr. Oneta Rack in the office in  the next 3-5 days.   I have instructed the patient to check her blood pressure daily and make  a record to show Dr. Oneta Rack next visit .   DISCHARGE MEDICATIONS:  1. Hyzaar 100 mg daily.  2. Hydrochlorothiazide 25 mg daily.  3. Synthroid 100 mcg daily.  4. Zoloft 75 mg daily.  5. Atenolol 12.5 mg b.i.d.  6. Lipitor 40 mg every other day.  7. __________  5 mg daily.  8. Aspirin 81 mg daily.  9. Calcium with vitamin D one daily.  10.Vitamin B  complex.  11.Vitamin E.  12.Magnesium 400 mg daily for 10 days.  13.Potassium supplement 10 mEq daily.   RECOMMENDATIONS:  1. Check BMET and magnesium in one week.  Follow up EKG with Dr.      Oneta Rack.  2. Office visit.  Antihypertensive may be adjusted if deemed necessary      by Dr. Oneta Rack.      Mobolaji B. Corky Downs, M.D.  Electronically Signed     MBB/MEDQ  D:  03/23/2007  T:  03/23/2007  Job:  [id number]   cc:   Lucky Cowboy, M.D.  Fax: [phone]

## 2011-01-19 NOTE — Cardiovascular Report (Signed)
[name], [name]:  [id number]   MEDICAL RECORD NO.:  [id number]          PATIENT TYPE:  INP   LOCATION:  6525                         FACILITY:  [facility]   PHYSICIAN:  Elmore Guise., M.D.DATE OF BIRTH:  Jan 31, 1934   DATE OF PROCEDURE:  06/12/2007  DATE OF DISCHARGE:                            CARDIAC CATHETERIZATION   INDICATIONS FOR PROCEDURE:  Chest pain with abnormal cardiac enzymes.   DESCRIPTION OF PROCEDURE:  The patient brought to the cardiac  catheterization0 lab after appropriate informed consent.  She was  prepped and draped in sterile fashion.  Approximately 10 mL of 1%  lidocaine was used for local anesthesia.  A 5-French sheath was placed  in the right femoral artery with the assistance of a Smart needle.  Coronary angiography, LV angiography and aortic root angiography were  then performed.  The patient was transferred from the cardiac  catheterization lab in stable condition.   FINDINGS:  1. Left Main:  No true left main noted.  Separate ostia for LAD and      left circumflex were seen.  2. LAD:  Mild ostial tapering with proximal to mid long 30-40%      stenosis with distal luminal irregularities.  3. Diagonal-1:  Small vessel with 60% ostial stenosis.  D-2: moderate      size artery, mild luminal irregularities  4. First septal perforator:  Small vessel with 80-90% proximal      stenosis.  5. Left circumflex:  Small to moderate-size vessel with mild luminal      irregularities.  6. OM-1:  High branching vessel, very small, 30-40% proximal stenosis.  7. OM-2:  Moderate to large vessel with mild luminal irregularities.  8. RCA:  Dominant, mid 30% stenosis.  9. PDA and posterolateral vessel:  Patent without significant disease.  10.LV:  EF of 60%.  No wall motion abnormalities.  LVEDP is 18 mmHg.  11.Aortic root angiogram showed mild ascending aortic root dilatation      with no dissection.  Trivial AI noted.   IMPRESSION:  1.  Small branch vessel coronary artery disease with 80-90% proximal      first septal perforator and 60% ostial diagonal.  2. Nonobstructive left anterior descending, right coronary artery, and      left circumflex.  3. Normal left ventricular systolic function with an ejection fraction      of 60%.  No wall motion abnormalities.  4. Mild ascending aortic root dilatation with trivial aortic      regurgitation.   PLAN:  At this time, I would recommend aggressive medical treatment and  risk factor modification as indicated.      Elmore Guise., M.D.  Electronically Signed    TWK/MEDQ  D:  06/12/2007  T:  06/12/2007  Job:  [id number]   cc:   Cassell Clement, M.D.

## 2011-01-19 NOTE — Assessment & Plan Note (Signed)
[facility]                         GASTROENTEROLOGY OFFICE NOTE   NAME:[name], [name]                     MRN:          [id number]  DATE:05/31/2007                            DOB:          11/22/1933    PROBLEM:  Gastroparesis.   [name] has returned for scheduled followup. She remains on  erythromycin twice a day. On this regimen, her nausea is entirely gone.  She has no current GI complaints. An adenomatous gastric polyp was  removed endoscopically. Followup examination was entirely normal.   PHYSICAL EXAMINATION:  Pulse 64, blood pressure 140/72, weight 150.   IMPRESSION:  1. Gastroparesis, well-controlled with erythromycin.  2. Adenomatous gastric polyp, removed.   RECOMMENDATIONS:  1. Continue erythromycin.  2. Followup endoscopy in approximately five years.     Barbette Hair. Arlyce Dice, MD,FACG  Electronically Signed    RDK/MedQ  DD: 05/31/2007  DT: 05/31/2007  Job #: [id number]   cc:   Lucky Cowboy, M.D.

## 2011-01-19 NOTE — Assessment & Plan Note (Signed)
[facility]                         GASTROENTEROLOGY OFFICE NOTE   NAME:[name], [name]                     MRN:          [id number]  DATE:01/17/2007                            DOB:          1934-04-19    PROBLEM:  1. Gastroparesis.  2. Gastric polyp.   [name] has returned following upper endoscopy and band ligation.  Her adenomatous gastric polyp was band ligated. She has gastroparesis  demonstrated by a gastric emptying scan.  She did not respond to Reglan.  In fact, a repeat scan on Reglan showed significant gastric delay. On  the regimen of erythromycin 500 mg twice daily she reports resolution of  her nausea.  She is without bloating.  She does have mild dysacousia.   EXAMINATION:  Pulse 64, blood pressure 120/70, weight 147.   IMPRESSION:  1. Gastroparesis - well controlled with erythromycin.  2. Adenomatous gastric polyp.   RECOMMENDATIONS:  1. Continue erythromycin.  2. Followup endoscopy to ensure that there are no polyp remnants.     Barbette Hair. Arlyce Dice, MD,FACG  Electronically Signed    RDK/MedQ  DD: 01/17/2007  DT: 01/17/2007  Job #: [id number]   cc:   Lucky Cowboy, M.D.

## 2011-01-19 NOTE — H&P (Signed)
[name], HARWICK NO.:  [id number]   MEDICAL RECORD NO.:  [id number]          PATIENT TYPE:  EMS   LOCATION:  MAJO                         FACILITY:  [facility]   PHYSICIAN:  Christell Faith, MD   DATE OF BIRTH:  1934-04-06   DATE OF ADMISSION:  08/17/2008  DATE OF DISCHARGE:                              HISTORY & PHYSICAL   Admit to Dr. Cassell Clement with [facility].   PRIMARY CARE PHYSICIAN:  Jeoffrey Massed, MD   CHIEF COMPLAINT:  Chest pain.   HISTORY OF PRESENT ILLNESS:  This is a 77 year old white female who  developed chest tightness last night while lying down trying to sleep.  It felt like someone was standing on her chest.  It was better when she  stood up and moved around.  It was relieved somewhat with her husband's  acid blockers.  She was able to get sleep, but then upon awakening this  morning, her pain recurred and worsened throughout the day.  It does  radiate to the left scapula and shoulder and was relieved in the  ambulance by nitroglycerin.  She has noticed increased dyspnea on  exertion recently and increased fatigue, but otherwise has been in her  normal state of health.   PAST MEDICAL HISTORY:  1. Paroxysmal atrial fibrillation with admissions to the hospital in      October 2008 and April 2009.  She is maintained on amiodarone and      Coumadin.  2. Obstructive branch vessel coronary artery disease including a      septal perforator artery that was too small for intervention.  3. Ejection fraction of 55% with mild-to-moderate aortic regurgitation      on prior echo.  4. Transitional cell carcinoma, status post nephrectomy.  5. Uterine cancer, status post hysterectomy.  6. Hypertension.  7. Hyperlipidemia.  8. Hypothyroidism.  9. Status post open heart surgery at age 58 for patent ductus      arteriosus repair.   SOCIAL HISTORY:  Lives in [place] with her husband.  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  Nonsmoker, nondrinker.   FAMILY HISTORY:  Mother died of intestinal cancer.  Father died of colon  cancer.   ALLERGIES:  CODEINE and ZINC.   MEDICATIONS:  1. Atenolol 150 mg p.o. daily.  2. Lipitor 40 mg p.o. daily.  3. Hyzaar 100/25 mg p.o. daily.  4. Aspirin 81 mg p.o. daily.  5. Amiodarone 200 mg p.o. daily.  6. Coumadin 2 mg p.o. nightly.  7. Zoloft 50 mg p.o. daily.  8. Synthroid 100 mcg p.o. daily.  9. Lyrica 75 mg p.o. daily.  10.Klor-Con 20 mEq p.o. daily.  11.Vitamin D 1000 units t.i.d.  12.Calcium daily.  13.Vitamin B12 daily.  14.Omega-3 daily.   REVIEW OF SYSTEMS:  Positive for fatigue, dyspnea on exertion, and chest  pain.  Otherwise the balance of 14 systems is reviewed and negative.   PHYSICAL EXAMINATION:  VITAL SIGNS:  Temperature 98.6, pulse 58,  respiratory rate 19, blood pressure 158/53, saturation 98% on room air.  Subsequent blood  pressure was recorded at 158/100.  This was taken in  the same arm.  GENERAL:  This is a pleasant older white female in no acute distress.  She is resting comfortably in the bed.  Pupils are equal, round, and  reactive to light.  Sclerae clear.  NECK:  Supple.  Dentition is good.  Mucous membranes are moist.  No  carotid bruits.  No JVD.  No cervical adenopathy.  CARDIAC:  Normal rate, regular rhythm.  Normal S1 and S2.  No extra  heart sounds.  There is a 2/6 aortic sclerosis murmur.  LUNGS: Clear to auscultation bilaterally without wheezing or rales.  ABDOMEN:  Soft, nontender, and nondistended.  EXTREMITIES:  No clubbing, cyanosis, or edema.  MUSCULOSKELETAL:  No acute joint deformities or effusions.  NEUROLOGIC:  Awake, alert, and oriented x3, 5/5 strength in all four  extremities.  SKIN:  No rash or lesion.   DIAGNOSTIC TESTS:  Chest x-ray shows mild lung hyperinflation.  EKG  shows sinus bradycardia at 57 beats per minute.  QTC is 487 milliseconds  and there are nonspecific T-wave changes.    LABORATORY DATA:  White blood cells 5.8, hemoglobin 13.4, platelets 198.  Sodium 142, potassium 3.6, BUN 17, creatinine 1.2, glucose 96, CK-MB  less than 1, troponin less than 0.05, INR 2.4.   IMPRESSION:  This is a 77 year old white female with chest pain, which  is displaying both typical and atypical features for angina.  Esophageal  etiology is also on the differential diagnosis.   PLAN:  1. Admit to telemetry unit to Dr. Patty Sermons.  2. Rule out myocardial infarction by cycling serial EKGs and cardiac      enzymes.  3. Continue her home medicines including amiodarone and Coumadin for      her atrial fibrillation.  4. We will add a proton pump inhibitor for better acid control.  5. Check BNP to evaluate her shortness of breath.  6. Consider inpatient versus outpatient stress test.  Dr. Patty Sermons to      see the patient in the morning.      Christell Faith, MD  Electronically Signed     NDL/MEDQ  D:  08/17/2008  T:  08/17/2008  Job:  [id number]

## 2011-01-22 NOTE — Discharge Summary (Signed)
[name], [name]                        ACCOUNT NO.:  [id number]   MEDICAL RECORD NO.:  [id number]                   PATIENT TYPE:  INP   LOCATION:  0373                                 [facility]ACILITY:  [facility]   PHYSICIAN:  Valetta Fuller, M.D.               DATE OF BIRTH:  [birth year]   DATE OF ADMISSION:  06/27/2002  DATE OF DISCHARGE:  07/02/2002                                 DISCHARGE SUMMARY   DISCHARGE DIAGNOSES:  1. 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  2. Hematuria.  3. Hypertension.   PROCEDURE:  Radical left nephroureterectomy on 06/27/02.   HISTORY OF PRESENT ILLNESS:  The patient is a 77 year old female.  She has  had some asymptomatic gross hematuria.  Her initial evaluation in 4/03, was  unremarkable, and her hematuria resolved.  She developed recurrent gross  hematuria.  She had a questionable soft tissue abnormality in the left renal  pelvis/caliceal system.  Retrograde pyelogram confirmed what appeared to be  transitional cell carcinoma involving an upper pole calyx of the left  kidney.  The tumor did not appear to be invasive based on imaging studies,  but it was difficult to say with certainty.  The tumor was extensive enough  that an endoscopic approach was not really feasible.  We discussed at length  with the patient the treatment options, and recommended radial  nephroureterectomy.  We considered a laparoscopic approach, but because of  her Jehovah's Witness status, we felt that it would be safer to do an open  procedure.   HOSPITAL COURSE:  On 08/27/02, the patient underwent relatively uneventful  radical nephroureterectomy.  We did use a Cell Saver which she allowed, but  her estimated blood loss was only 200 cc, and therefore transfusion was not  required.  Her nephroureterectomy was done by two incisions.  Her  postoperative course was relatively unremarkable.  On postoperative day #1,  her hemoglobin level was normal, as was renal  function with a creatinine of  1.4.  Urine output remained good.  She had a return of bowel function on  postoperative day #2, and Jackson-Pratt drain was putting out very little.  Urine output remained quite good.  She was able to be discharged home on  07/02/02, which was postoperative day #5.  At that time, she had a bowel  movement and was tolerating a general diet well.  Her Jackson-Pratt drain  was out.  She was sent home with an indwelling Foley catheter.  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   DISPOSITION:  The patient was discharged home.   DISCHARGE MEDICATIONS:  Vicodin q.4h. p.r.n. pain.   FOLLOWUP:  She will be seen in our office in a few days for staple removal,  and we will plan on removing her Foley catheter 10 to 14 days status post  her procedure.  Valetta Fuller, M.D.    DSG/MEDQ  D:  09/10/2002  T:  09/10/2002  Job:  [id number]

## 2011-01-22 NOTE — Assessment & Plan Note (Signed)
[facility]                         GASTROENTEROLOGY OFFICE NOTE   NAME:[name], [name]                     MRN:          [id number]  DATE:12/28/2006                            DOB:          10-21-1933    PROBLEM:  Nausea.   [name] has returned for scheduled followup.  Upper endoscopy  demonstrated a gastric polyp.  Pathology demonstrated adenomatous  changes.  A gastric emptying scan showed a moderate delay with 57%  activity at 2 hours with normal less than 30%.  She was started on  Reglan one half hour a.c. and nightly with transient improvement. Nausea  has returned.  She continues to complain of right upper quadrant pain  with some radiation to the right lower quadrant.   EXAMINATION:  Pulse 48, blood pressure 128/72, weight 148.  ABDOMEN:  She has mild right upper quadrant and right periumbilical  tenderness without guarding or rebound.  There are no abdominal masses  or organomegaly.   IMPRESSION:  Persistent nausea and abdominal pain.  Symptoms could be  due to gastroparesis which is not responding to Reglan.  She is status  post cholecystectomy, though recently have demonstrated minimally  elevated alkaline phosphatase of 132.  A recent questionable of biliary  tract disease.  It is noteworthy that a recent CT was negative, though  the bile duct was not described.   RECOMMENDATIONS:  1. Increase Reglan to 20 mg 1/2 hour a.c. and nightly.  If she is not      improved, then I will repeat the scan to determine whether she      still has gastroparesis.  2. Abdominal ultrasound if the above measures are not diagnostic or      not successful.     Barbette Hair. Arlyce Dice, MD,FACG  Electronically Signed    RDK/MedQ  DD: 12/28/2006  DT: 12/28/2006  Job #: [id number]   cc:   Lucky Cowboy, M.D.

## 2011-01-22 NOTE — Assessment & Plan Note (Signed)
[facility]                         GASTROENTEROLOGY OFFICE NOTE   NAME:Rohrig, TYREKA HENNEKE                     MRN:          [id number]  DATE:11/28/2006                            DOB:          1934/02/27    PROBLEM:  Abdominal pain, nausea, and vomiting.   Reason Ms. Haecker has returned, for re-evaluation.  Over the past year,  but especially over the past few weeks she has been experiencing of  right upper and lower quadrant pain.  This may or may not be associated  with nausea.  She has had severe episodes of nausea with vomiting.  She  describes the vomit is at one point as feculent.  She has a foul taste  in her mouth.  She has also had episodes of diarrhea.  She has had no  change in her medications.  In May 2007 she underwent a colonoscopy that  was normal.  This was for Hemoccult positive stools.  Followup  Hemoccults were requested but were not received.  She has lost only 2 or  3 pounds.  She underwent a CT scan of the abdomen and pelvis that was  unremarkable.  She has a history of uterine cancer and kidney cancer.   MEDICATIONS:  Verapamil, Hyzaar, Synthroid, Zoloft, Atenolol, Lipitor,  clonazepam, Nexium, and Levsin.   SHE IS ALLERGIC TO CODEINE AND ZINC.   PHYSICAL EXAMINATION:  Pulse 78, blood pressure 132/76, weight 146.  HEENT: EOMI. PERRLA. Sclerae are anicteric.  Conjunctivae are pink.  NECK:  Supple without thyromegaly, adenopathy or carotid bruits.  CHEST:  Clear to auscultation and percussion without adventitious  sounds.  CARDIAC:  Regular rhythm; normal S1 S2.  There are no murmurs, gallops  or rubs.  ABDOMEN:  She has mild right upper quadrant and right periumbilical  tenderness with out guarding or rebound.  There are no abdominal masses  or organomegaly.  EXTREMITIES:  Full range of motion.  No cyanosis, clubbing or edema.  RECTAL:  Deferred.   IMPRESSION:  Episodic nausea with occasional vomiting and right-sided  abdominal pain.  Symptoms are reminiscent of a partial bowel  obstruction.  A gastric lesion and gastroparesis are other  considerations.  Recurrent neoplasia is less likely in view of her  negative CT.   RECOMMENDATION:  1. Endoscopy.  If this is negative I will obtain a gastric emptying      scan.  Finally, I will proceed      with ordering a small bowel enterography if the above tests are not      diagnostic.  2. Check CBC, amylase, and CMET.     Barbette Hair. Arlyce Dice, MD,FACG  Electronically Signed    RDK/MedQ  DD: 11/28/2006  DT: 11/28/2006  Job #: [id number]   cc:   Lucky Cowboy, M.D.

## 2011-01-22 NOTE — H&P (Signed)
[name], [name]                        ACCOUNT NO.:  [id number]   MEDICAL RECORD NO.:  [id number]                   PATIENT TYPE:  INP   LOCATION:  0005                                 FACILITY:  [facility]   PHYSICIAN:  Valetta Fuller, M.D.               DATE OF BIRTH:  10/01/1933   DATE OF ADMISSION:  06/27/2002  DATE OF DISCHARGE:                                HISTORY & PHYSICAL   REASON FOR ADMISSION:  Postoperative care status post left  nephroureterectomy.   HISTORY OF PRESENT ILLNESS:  The patient is a 77 year old female.  She  initially had some asymptomatic gross hematuria in April 2003.  She had been  treated with antibiotics and her hematuria resolved.  We felt it prudent to  evaluate her and she had a full contrast CT scan with cytology and  cystoscopy which revealed no evidence of abnormality.  Follow-up urine was  clear and she remained asymptomatic for about six months.  The patient then  developed some right flank pain with recurrent gross hematuria.  A  noncontrast CT showed no abnormalities on the right symptomatic side, but a  question of a soft tissue mass in the left renal pelvis or caliceal system.  Retrograde pyelogram confirmed a filling defect in the upper pole calyx of  the left kidney.  Visual inspection as well as biopsies revealed this to be  a transitional cell carcinoma involving the entire calyx.  Repeat contrast  CT showed questionable small amount of renal parenchymal invasion.  There  was no evidence of metastatic disease.  Contralateral renal functioning is  normal.  The patient underwent extensive counseling with regard to treatment  options but we felt the only reasonable approach would be a  nephroureterectomy.  The patient is a TEFL teacher Witness and refuses blood  transfusion infusion including autologous donation.  She is agreeable to a  cell saver which will be used only in an emergent situation.  She continues  to have a little  nonspecific right low back pain of uncertain etiology and  some intermittent hematuria.  A double J stent had previously been placed.   PAST MEDICAL HISTORY:  Significant primarily for hypertension.   CURRENT MEDICATIONS:  Verapamil, Hyzaar, atenolol, Synthroid, Zoloft,  Evista, vitamins.   ALLERGIES:  She has an intolerance to CODEINE and MORPHINE.   SOCIAL HISTORY:  The patient has a very minimal tobacco use history about 40  years ago.   PHYSICAL EXAMINATION:  GENERAL:  She is a well-developed, well-nourished  female.  VITAL SIGNS:  Her current weight is approximately 140 pounds and she is 5  feet 1 inch tall.  Her blood pressure is 150/90 with a pulse 72 and she is  afebrile.  NECK:  Her neck shows no JVD.  CHEST:  Clear to auscultation.  ABDOMEN:  Somewhat protuberant, but soft without obvious masses.  EXTREMITIES:  No edema.   LABORATORY  DATA:  Hemoglobin is normal at 14.2.  Renal function and all  basic laboratory studies are otherwise unremarkable.    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  She will undergo nephroureterectomy on the left side  today.  A cell saver will be used but the blood will only be given in dire  emergency because of the potential of contamination of cancer cells.  This  has all been discussed with the patient and family at extensive length.                                               Valetta Fuller, M.D.    DSG/MEDQ  D:  06/27/2002  T:  06/27/2002  Job:  [id number]

## 2011-01-22 NOTE — Discharge Summary (Signed)
   [name], [name]                        ACCOUNT NO.:  [id number]   MEDICAL RECORD NO.:  [id number]                   PATIENT TYPE:  INP   LOCATION:  0373                                 FACILITY:  [facility]   PHYSICIAN:  Valetta Fuller, M.D.               DATE OF BIRTH:  [birth year]   DATE OF ADMISSION:  06/27/2002  DATE OF DISCHARGE:  07/02/2002                                 DISCHARGE SUMMARY   ADMISSION DIAGNOSIS:  Left renal transitional cell carcinoma.   DISCHARGE DIAGNOSIS:  Left renal transitional cell carcinoma.   PROCEDURES:  Left nephroureterectomy on June 27, 2002.   DISCHARGE MEDICATIONS:  Vicodin 1-2 p.o. q.4 h. p.r.n. pain.   BRIEF HISTORY:  The patient is a 77 year old female who presented with  asymptomatic gross hematuria in April of 2003.  She was initially treated  with antibiotics and hematuria resolved.  She was eventually evaluated by  Dr. Isabel Caprice and received a full-contrast CT scan with cytology and cystoscopy  which demonstrated no evidence of abnormality.  Urine was clear and she  remained asymptomatic for six months.  She then felt right flank pain and  gross hematuria.  A noncontrast CT showed no abnormalities in the right, the  symptomatic side, but a question of a soft tissue mass in the left renal  pelvis or calyceal system.  Retrograde pyelogram confirmed a filling defect  in the upper pole of left calyx.  Ureteroscopy, pyeloscopy demonstrated  transitional cell carcinoma which was proved positive by biopsy.  She  therefore elected for nephroureterectomy.   BRIEF HOSPITAL COURSE:  The patient underwent uncomplicated left  nephroureterectomy on June 27, 2002.  Estimated blood loss of 250 cc.  She began to have flatus on postoperative day #2.  Began to advance her diet  on postoperative day #2.  On postoperative day #3, her Harrison Mons drain was  removed.  She was switched to oral pain medications.  By postoperative day  #4, she began a regular  diet.  She was afebrile with normal vital signs and  ambulating some.  She was then discharged home on postoperative day #5.   FOLLOW UP:  Follow up with Dr. Isabel Caprice in 5-7 days for staple removal.     Melvyn Novas, M.D.                      Valetta Fuller, M.D.    DK/MEDQ  D:  08/27/2002  T:  08/27/2002  Job:  [id number]

## 2011-01-22 NOTE — Op Note (Signed)
[name], [name]                        ACCOUNT NO.:  [id number]   MEDICAL RECORD NO.:  [id number]                   PATIENT TYPE:  AMB   LOCATION:  NESC                                 FACILITY:  [facility]   PHYSICIAN:  Valetta Fuller, M.D.               DATE OF BIRTH:  Dec 14, 1933   DATE OF PROCEDURE:  DATE OF DISCHARGE:                                 OPERATIVE REPORT   PREOPERATIVE DIAGNOSES:  1. Gross hematuria.  2. Right flank pain.   POSTOPERATIVE DIAGNOSES:  1. Gross hematuria.  2. Right flank pain.  3. Left upper pole transitional cell carcinoma.   PROCEDURE:  Cystoscopy, bilateral  retrograde pyelography, left sided  flexible ureteroscopy with biopsy both cold cup and brush and upper tract  washings for cytology and double J stent placement.   SURGEON:  Valetta Fuller, M.D.   ANESTHESIA:  General.   INDICATIONS FOR PROCEDURE:  [name] is a 77 year old female that we  originally saw for asymptomatic gross hematuria in the spring of this year.  She had asymptomatic gross hematuria that had resolved. She was then on some  antibiotics but it was not clear whether she really had had an infection or  not. She had some microscopic hematuria. We evaluated her and did not feel  comfortable assuming that this was indeed due to an infection. The patient  had an abdominopelvic CT with contrast which was totally normal. She had a  urine cytology which was unremarkable and she had a cystoscopy was  unremarkable. In addition, her urine cleared up and her workup was  completely negative. We felt that she be reevaluated if she had recurrent  bleeding. The patient recently represented this time with some gross  hematuria. She was also having some nonspecific right back and lower flank  discomfort. For that reason, we ordered a noncontrast CT to see if she had a  stone. This showed what appeared to be a somewhat dilated hyperdense upper  pole in the left renal collecting  system. We felt that the differential  diagnosis could include a clot or transitional cell carcinoma or possibly a  peripelvic cyst. There were no abnormalities on the right. I explained to  [name] that she really needed an additional evaluation because of the  recurrent gross hematuria, the right flank pain and the left sided  abnormality.   TECHNIQUE AND FINDINGS:  The patient was brought to the operating room where  she had successful induction of general anesthesia. She was placed in mid  lithotomy position and prepped and draped in the usual manner. Cystoscopy  was unremarkable. Urine appeared clear from both ureteral orifices. The  right retrograde pyelogram was completely normal without evidence of filling  defect or dilation. The left side of the ureter was completely normal. In  the upper pole, there was a large filling defect involving the entire upper  pole calix. I  placed a guidewire to the left renal pelvis and into the upper  pole calix. We then used an access sheath and performed flexible  ureteroscopy. Upon entering the upper pole calix, it was obvious that there  was an exophytic papillary tumor involving the entire calix. This was  actively oozing probably due to the wire placement. I did 1 or 2 cold cup  biopsy  as well as brushings and a washing for cytology. Visually there was  no doubt in my mind that this was papillary transitional cell carcinoma.  Hopefully it will be superficial. At the completion of this portion of the  procedure, we removed the ureteroscope and placed a 24 cm 7 [place] double J  stent. The patient was then brought to the recovery room in stable  condition. We had an extended conference with the family with regard to  these issues. She will need another abdominopelvic CT with contrast for  staging purposes. This patient is going to require a nephroureterectomy and  will need to decide whether we want to do this hand assisted or through the   traditional incisional approach. We will have the patient and family back  some time in the near future for an extended cancer conference.                                                Valetta Fuller, M.D.    DSG/MEDQ  D:  06/11/2002  T:  06/11/2002  Job:  [id number]   cc:   Lucky Cowboy, M.D.  [address], [address]  [place], [place] [address]  Fax: [phone]

## 2011-01-22 NOTE — Op Note (Signed)
[name], [name]                        ACCOUNT NO.:  [id number]   MEDICAL RECORD NO.:  [id number]                   PATIENT TYPE:  INP   LOCATION:  0373                                 FACILITY:  [facility]   PHYSICIAN:  Valetta Fuller, M.D.               DATE OF BIRTH:  [birth year]   DATE OF PROCEDURE:  06/27/2002  DATE OF DISCHARGE:                                 OPERATIVE REPORT   PREOPERATIVE DIAGNOSIS:  Transitional cell carcinoma of the upper pole of  the left kidney.   POSTOPERATIVE DIAGNOSIS:  Transitional cell carcinoma of the upper pole of  the left kidney.   PROCEDURE PERFORMED:  Radical nephroureterectomy.   SURGEON:  Valetta Fuller, M.D.   FIRST ASSISTANT:  Bertram Millard. Retta Diones, M.D.   SECOND ASSISTANT:  Melvyn Novas, M.D.   ANESTHESIA:  General endotracheal.   INDICATIONS:  The patient is a 77 year old female.  Six months ago she had  increasing gross hematuria.  At that time contrast CT, cytology and  cystoscopy were unremarkable.  She had recurrent gross hematuria on follow-  up six months later.  Repeat CT scan showed a questionable abnormality in  the left renal pelvis/upper collecting system.  The abnormality was further  investigated with retrograde pyelograms, which confirmed filling defect in  the upper pole calyces of the left kidney.  Ureteroscopy confirmed  transitional cell carcinoma involving the entire upper pole calyx.  Biopsies  were positive.  Repeat contrast CT showed questionable parenchymal invasion,  but no evidence of metastatic disease or disease outside the kidney.   The patient underwent extensive counseling and it was recommended that she  have a nephroureterectomy.  Her contralateral kidney was normal.  She  understood the advantages and disadvantages of this approach, but we felt  there was no other real viable options.  The patient is a Scientist, product/process development.  She understood the issues with regarding the possibility of needing  blood  and her refusal for that.  She did agree to a self-saver, which we felt  would not be utilized unless we obtained a massive hemorrhage.  All of this  was discussed with the patient and her family at great length.  They  understood all the complications, including the possibility of  intraoperative death due to cardiac or pulmonary events as well as massive  hemorrhage.  We talked about the potential cure rates, as well as the  extensive discussion about the surgery itself, recovery, etc.  Full and  informed consent was obtained.   TECHNIQUE AND FINDINGS:  The patient was brought to the operating room,  where she had the successful induction of general endotracheal anesthesia.  She was placed in the supine position with a small bump under her left flank  region.  A Foley catheter was placed to a three-way to allow for bladder  distention.  She was prepped and draped in the usual manner.  A standard  subcostal incision was performed.  The kidney was encountered and no  evidence of disease was noted extending outside the kidney.  We freed the  colon and mobilized it completely.  We were then able to establish a plane  between Gerota's fascia and the colon.  Inferiorly we identified the ureter  and took all other inferior tissues.  Medially we were able to identify a  single renal artery and vein.  The artery was doubly ligated proximally and  once distally.  The vein then decompressed nicely and was also doubly  ligated proximally.  We came across the top of Gerota's fascia, leaving the  adrenal gland.  The adrenal gland was identified, as well as the gonadal --  those were ligated as well.  Additional attachments were taken off the  spleen.  There was a slight amount of ooze, but the estimated blood loss at  that point was about 200 cc.  We then mobilized the ureter all the way down  to the bladder.  The kidney was then stuffed in the pelvis and the subcostal  incision was closed in  a standard manner utilizing multiple layers of  running #1 PDS suture.   Attention was then turned to the bladder.  A Pfannenstiel incision was then  performed.  The retropubic space was entered and the bladder was slightly  distended.  Stay sutures were placed in the dome and the bladder was opened.  The stent was identified, which had previously been placed in the left  ureter.  It was suture ligated and the top of the bladder was then taken  out.  We dissected out the ureter intramurally, as we would for the [facility]  implantation.  In this manner the entire specimen was then removed.  The  bladder was then closed from the inside, utilizing full-thickness  interrupted 3-0 Vicryl sutures.  The dome of the bladder was then closed  with several layers of 2-0 Vicryl.  A drain was placed.  The incision was  copiously irrigated.  The Pfannenstiel incision was then closed with the #1  PDS suture.  Overall blood loss was about 250 cc.  The patient remained  quite stable.  The Foley catheter was left to gravity drainage.  She  appeared to tolerate the procedure well.  All sponge and needle counts were  correct. She was brought to the recovery room in stable condition.                                               Valetta Fuller, M.D.    DSG/MEDQ  D:  06/27/2002  T:  06/28/2002  Job:  [id number]

## 2011-01-22 NOTE — Op Note (Signed)
   [name], [name]                        ACCOUNT NO.:  [id number]   MEDICAL RECORD NO.:  [id number]                   PATIENT TYPE:  AMB   LOCATION:  NESC                                 [facility]   PHYSICIAN:  Valetta Fuller, M.D.               DATE OF BIRTH:  March 10, 1934   DATE OF PROCEDURE:  DATE OF DISCHARGE:                                 OPERATIVE REPORT   PREOPERATIVE DIAGNOSES:  1. History of transitional cell carcinoma of the left renal pelvis.  2. Recent positive FISH urine cytology.   POSTOPERATIVE DIAGNOSES:  1. History of transitional cell carcinoma of the left renal pelvis.  2. Recent positive FISH urine cytology.   PROCEDURE PERFORMED:  Cystoscopy, bladder barbotage for cytology, right  retrograde pyelography, bladder biopsy x2 with fulguration.   SURGEON:  Valetta Fuller, M.D.   ANESTHESIA:  General.   INDICATIONS FOR PROCEDURE:  [name] is a 77 year old female. She  underwent a left nephroureterectomy for transitional cell carcinoma of the  renal pelvis. That was in October of 2003. Pathology revealed a large but  superficial tumor. She has done extremely well clinically. Her urine has  been clear and recent cystoscopy was negative. As a surveillance tool, we  sent FISH cytology which was positive. She presents now for reevaluation of  her urothelium.   TECHNIQUE:  The patient was brought to the operating room where she had  successful induction of general anesthesia. She was placed in a limited  lithotomy position due to decreased mobility of her right leg and hip. She  was prepped and draped in the usual manner. Cystoscopy showed a few tortuous  blood vessels but nothing really suggestive of carcinoma in situ. There was  some slight nonspecific bladder erythema. Barbotage was performed with  saline. Right retrograde pyelogram showed a normal caliber ureter and a very  delicate collecting system without filling defects. I did two small  cold cup  biopsies of the areas of increased erythema of her bladder but I was not  terribly suspicious of these. These were then fulgurated. The patient  tolerated the procedure well. There were no obvious complications.                                                Valetta Fuller, M.D.    DSG/MEDQ  D:  12/12/2002  T:  12/12/2002  Job:  [id number]

## 2011-02-04 ENCOUNTER — Ambulatory Visit: Payer: 59 | Admitting: Nurse Practitioner

## 2011-02-05 ENCOUNTER — Encounter: Payer: 59 | Admitting: *Deleted

## 2011-02-08 ENCOUNTER — Ambulatory Visit: Payer: Medicare Other | Admitting: Nurse Practitioner

## 2011-02-08 ENCOUNTER — Ambulatory Visit (INDEPENDENT_AMBULATORY_CARE_PROVIDER_SITE_OTHER): Payer: 59 | Admitting: *Deleted

## 2011-02-08 DIAGNOSIS — I4891 Unspecified atrial fibrillation: Secondary | ICD-10-CM

## 2011-02-08 LAB — POCT INR: INR: 3.3

## 2011-02-15 ENCOUNTER — Encounter: Payer: Self-pay | Admitting: Nurse Practitioner

## 2011-02-15 ENCOUNTER — Ambulatory Visit (INDEPENDENT_AMBULATORY_CARE_PROVIDER_SITE_OTHER): Payer: 59 | Admitting: Nurse Practitioner

## 2011-02-15 ENCOUNTER — Ambulatory Visit (INDEPENDENT_AMBULATORY_CARE_PROVIDER_SITE_OTHER): Payer: 59 | Admitting: *Deleted

## 2011-02-15 DIAGNOSIS — I4891 Unspecified atrial fibrillation: Secondary | ICD-10-CM

## 2011-02-15 DIAGNOSIS — Z79899 Other long term (current) drug therapy: Secondary | ICD-10-CM

## 2011-02-15 DIAGNOSIS — I495 Sick sinus syndrome: Secondary | ICD-10-CM

## 2011-02-15 DIAGNOSIS — I10 Essential (primary) hypertension: Secondary | ICD-10-CM

## 2011-02-15 DIAGNOSIS — E785 Hyperlipidemia, unspecified: Secondary | ICD-10-CM

## 2011-02-15 LAB — POCT INR: INR: 3.1

## 2011-02-15 NOTE — Progress Notes (Signed)
[name] Date of Birth: 1934/08/08   History of Present Illness: [name] is seen today for a 2 month visit. She is seen for Dr. Patty Sermons. She has PAF. She is on low dose amiodarone and is maintained on chronic coumadin therapy. She is doing well. No chest pain. She does have shortness of breath but it is at baseline. She had some palpitations yesterday and felt like she was in atrial fib but is not really bothered by it like she used to be. She is not dizzy. She is tolerating her medicines. She has mild bruising.   Current Outpatient Prescriptions on File Prior to Visit  Medication Sig Dispense Refill  . amiodarone (PACERONE) 200 MG tablet Take 200 mg by mouth. taking one half tab daily      . Ascorbic Acid (VITAMIN C PO) Take by mouth daily.       [name] atenolol (TENORMIN) 100 MG tablet 1 tab po bid       . atorvastatin (LIPITOR) 40 MG tablet Take 40 mg by mouth 3 (three) times a week.        . Calcium Carbonate-Vitamin D (CALCIUM + D PO) Take by mouth daily.        . Cholecalciferol (VITAMIN D-3) 5000 UNITS TABS 1 tab po qd       . Cyanocobalamin (B-12 PO) Take by mouth daily.       . Famotidine (PEPCID PO) Take by mouth daily.        . furosemide (LASIX) 40 MG tablet Take 20 mg by mouth 3 (three) times a week. 1/2 tab po qd      . levothyroxine (SYNTHROID, LEVOTHROID) 100 MCG tablet 1/2 tab po qd      . losartan-hydrochlorothiazide (HYZAAR) 100-25 MG per tablet 1/2 tab po qd      . MAGNESIUM PO Take by mouth 3 (three) times a week.        . nitroGLYCERIN (NITRODUR - DOSED IN MG/24 HR) 0.4 mg/hr Place 1 patch onto the skin daily.        . nitroGLYCERIN (NITROSTAT) 0.4 MG SL tablet Place 0.4 mg under the tongue every 5 (five) minutes as needed.        . Omega-3 Fatty Acids (FISH OIL PO) Take by mouth daily.       . potassium chloride SA (K-DUR,KLOR-CON) 20 MEQ tablet Take 20 mEq by mouth 2 (two) times daily.       . pregabalin (LYRICA) 75 MG capsule Take 75 mg by mouth 2 (two) times  daily.       . sertraline (ZOLOFT) 100 MG tablet Take 100 mg by mouth daily.        [name] warfarin (COUMADIN) 2 MG tablet Take 2 mg by mouth as directed.          Allergies  Allergen Reactions  . Codeine     Past Medical History  Diagnosis Date  . PAF (paroxysmal atrial fibrillation)     controlled with amiodarone, on coumadin  . CAD (coronary artery disease)   . Aortic root dilatation   . Transitional cell carcinoma   . History of uterine cancer   . HTN (hypertension)   . Hyperlipemia   . Hypothyroidism   . Moderate aortic stenosis   . Chronic anticoagulation   . Tachycardia-bradycardia syndrome     s/p PPM by Dr Deborah Chalk (MDT) 07/03/10    Past Surgical History  Procedure Date  . Cardiac catheterization 2008  .  Nephrectomy   . Total abdominal hysterectomy   . Patent ductus arterious repair age 56  . Ptvp   . Pacemaker insertion 07/04/11    by Dr Deborah Chalk    History  Smoking status  . Former Smoker  Smokeless tobacco  . Never Used    History  Alcohol Use No    Family History  Problem Relation Age of Onset  . Cancer Mother   . Heart disease Mother   . Heart disease Father   . Heart attack Father   . Heart disease Maternal Grandmother     Review of Systems: The review of systems is positive for mild bruising. She has joint issues.  All other systems were reviewed and are negative.  Physical Exam: BP 136/80  Pulse 84  Ht 5\' 1"  (1.549 m)  Wt 150 lb 9.6 oz (68.312 kg)  BMI 28.46 kg/m2 Patient is very pleasant and in no acute distress. She looks younger than her stated age. Skin is warm and dry. Color is normal.  HEENT is unremarkable. Normocephalic/atraumatic. PERRL. Sclera are nonicteric. Neck is supple. No masses. No JVD. Lungs are clear. Cardiac exam shows an irregular rhythm today, consistent with atrial fib. Her rate is controlled. Abdomen is soft. Extremities are without edema. Gait and ROM are intact. No gross neurologic deficits noted.  LABORATORY  DATA: INR is pending  Assessment / Plan:

## 2011-02-15 NOTE — Assessment & Plan Note (Signed)
Labs are checked by her PCP. She reports being on a lower dose of statin due to some leg discomforts that has improved.

## 2011-02-15 NOTE — Patient Instructions (Signed)
Stay on your current medicines. Call for any problems. We will see you back in about 2 months and will check lab work on return to follow up your amiodarone.

## 2011-02-15 NOTE — Assessment & Plan Note (Signed)
Blood pressure looks good. Will continue with her current regimen.

## 2011-02-15 NOTE — Assessment & Plan Note (Signed)
She is doing well. Rate is controlled. She is followed in the device clinic. No change in her medicines.

## 2011-02-15 NOTE — Assessment & Plan Note (Signed)
She appears to be in atrial fib today by physical exam. She is on chronic coumadin. She is currently asymptomatic. I have left her on her current regimen. She will get her coumadin checked today. I will have her see Dr. Patty Sermons back in 2 months. She will need follow up labs on return. Patient is agreeable to this plan and will call if any problems develop in the interim.

## 2011-03-08 ENCOUNTER — Encounter: Payer: 59 | Admitting: *Deleted

## 2011-03-11 ENCOUNTER — Ambulatory Visit (INDEPENDENT_AMBULATORY_CARE_PROVIDER_SITE_OTHER): Payer: 59 | Admitting: *Deleted

## 2011-03-11 DIAGNOSIS — I4891 Unspecified atrial fibrillation: Secondary | ICD-10-CM

## 2011-03-15 ENCOUNTER — Telehealth: Payer: Self-pay | Admitting: Cardiology

## 2011-03-15 NOTE — Telephone Encounter (Signed)
Can patient come for coumadin ck on 04/22/2011 and not on 04/08/2011.  Patient lives out of town and works in [place].

## 2011-03-29 ENCOUNTER — Inpatient Hospital Stay (HOSPITAL_COMMUNITY)
Admission: EM | Admit: 2011-03-29 | Discharge: 2011-03-31 | DRG: 308 | Disposition: A | Discharge status: Home / Self Care | Payer: 59 | Attending: Cardiology | Admitting: Cardiology

## 2011-03-29 ENCOUNTER — Emergency Department (HOSPITAL_COMMUNITY): Payer: 59

## 2011-03-29 DIAGNOSIS — I10 Essential (primary) hypertension: Secondary | ICD-10-CM | POA: Diagnosis present

## 2011-03-29 DIAGNOSIS — I509 Heart failure, unspecified: Secondary | ICD-10-CM | POA: Diagnosis present

## 2011-03-29 DIAGNOSIS — I5033 Acute on chronic diastolic (congestive) heart failure: Secondary | ICD-10-CM | POA: Diagnosis present

## 2011-03-29 DIAGNOSIS — E039 Hypothyroidism, unspecified: Secondary | ICD-10-CM | POA: Diagnosis present

## 2011-03-29 DIAGNOSIS — I359 Nonrheumatic aortic valve disorder, unspecified: Secondary | ICD-10-CM | POA: Diagnosis present

## 2011-03-29 DIAGNOSIS — E785 Hyperlipidemia, unspecified: Secondary | ICD-10-CM | POA: Diagnosis present

## 2011-03-29 DIAGNOSIS — Z7901 Long term (current) use of anticoagulants: Secondary | ICD-10-CM

## 2011-03-29 DIAGNOSIS — Z950 Presence of cardiac pacemaker: Secondary | ICD-10-CM

## 2011-03-29 DIAGNOSIS — I2510 Atherosclerotic heart disease of native coronary artery without angina pectoris: Secondary | ICD-10-CM | POA: Diagnosis present

## 2011-03-29 DIAGNOSIS — R0602 Shortness of breath: Secondary | ICD-10-CM

## 2011-03-29 DIAGNOSIS — Z7982 Long term (current) use of aspirin: Secondary | ICD-10-CM

## 2011-03-29 DIAGNOSIS — I4891 Unspecified atrial fibrillation: Principal | ICD-10-CM | POA: Diagnosis present

## 2011-03-29 DIAGNOSIS — E876 Hypokalemia: Secondary | ICD-10-CM | POA: Diagnosis present

## 2011-03-29 LAB — CBC
MCH: 28.4 pg (ref 26.0–34.0)
MCHC: 33.9 g/dL (ref 30.0–36.0)
MCV: 83.7 fL (ref 78.0–100.0)
Platelets: 191 10*3/uL (ref 150–400)
RBC: 4.72 MIL/uL (ref 3.87–5.11)

## 2011-03-29 LAB — URINALYSIS, ROUTINE W REFLEX MICROSCOPIC
Bilirubin Urine: NEGATIVE
Hgb urine dipstick: NEGATIVE
Protein, ur: NEGATIVE mg/dL
Urobilinogen, UA: 0.2 mg/dL (ref 0.0–1.0)

## 2011-03-29 LAB — COMPREHENSIVE METABOLIC PANEL
ALT: 23 U/L (ref 0–35)
Alkaline Phosphatase: 125 U/L — ABNORMAL HIGH (ref 39–117)
BUN: 17 mg/dL (ref 6–23)
CO2: 26 mEq/L (ref 19–32)
Calcium: 9.7 mg/dL (ref 8.4–10.5)
GFR calc Af Amer: 60 mL/min — ABNORMAL LOW (ref >60)
GFR calc non Af Amer: 49 mL/min — ABNORMAL LOW (ref >60)
Glucose, Bld: 97 mg/dL (ref 70–99)
Sodium: 140 mEq/L (ref 135–145)

## 2011-03-29 LAB — DIFFERENTIAL
Eosinophils Absolute: 0.2 10*3/uL (ref 0.0–0.7)
Eosinophils Relative: 4 % (ref 0–5)
Lymphs Abs: 2 10*3/uL (ref 0.7–4.0)
Monocytes Absolute: 0.7 10*3/uL (ref 0.1–1.0)
Monocytes Relative: 11 % (ref 3–12)

## 2011-03-30 DIAGNOSIS — I4891 Unspecified atrial fibrillation: Secondary | ICD-10-CM

## 2011-03-30 LAB — CARDIAC PANEL(CRET KIN+CKTOT+MB+TROPI)
CK, MB: 2.4 ng/mL (ref 0.3–4.0)
Troponin I: <0.3 ng/mL (ref <0.30)

## 2011-03-30 LAB — BASIC METABOLIC PANEL
BUN: 16 mg/dL (ref 6–23)
Creatinine, Ser: 1.19 mg/dL — ABNORMAL HIGH (ref 0.50–1.10)
GFR calc Af Amer: 54 mL/min — ABNORMAL LOW (ref >60)
GFR calc non Af Amer: 44 mL/min — ABNORMAL LOW (ref >60)

## 2011-03-30 LAB — PROTIME-INR
INR: 1.72 — ABNORMAL HIGH (ref 0.00–1.49)
Prothrombin Time: 20.5 seconds — ABNORMAL HIGH (ref 11.6–15.2)

## 2011-03-31 LAB — PROTIME-INR: INR: 1.66 — ABNORMAL HIGH (ref 0.00–1.49)

## 2011-04-05 ENCOUNTER — Encounter: Payer: 59 | Admitting: *Deleted

## 2011-04-05 NOTE — H&P (Signed)
[name], MEINDERS NO.:  [id number]  MEDICAL RECORD NO.:  [id number]  LOCATION:  [facility]                         FACILITY:  [facility]  PHYSICIAN:  Cassell Clement, M.D. DATE OF BIRTH:  1933-11-07  DATE OF ADMISSION:  03/29/2011 DATE OF DISCHARGE:                             HISTORY & PHYSICAL   CHIEF COMPLAINT:  Shortness of breath.  HISTORY OF PRESENT ILLNESS:  The patient is a 77 year old white female with past medical history significant for paroxysmal atrial fibrillation, nonobstructive CAD per left heart catheterization in 2008, hyperlipidemia, hypothyroidism, tachybrady syndrome status post Medtronic pacemaker in February 2011, who is now presenting with a 1-day history of feeling bad and increased dyspnea on exertion.  The patient states that for the past week, she has had a mild increase in dyspnea on exertion.  Over the past 24 hours, she has had issues with her heart racing and worsening of dyspnea on exertion.  She also endorses some orthopnea.  These symptoms have not been inconsistent for quite some time.  She has not had any recent changes to her medications.  Per the family, the patient's heart rate at her doctor's office today was in the 90s and she was given a tablet of diltiazem.  PAST MEDICAL HISTORY:  As above in HPI.  SOCIAL HISTORY:  No tobacco, no alcohol.  FAMILY HISTORY:  Negative for premature coronary artery disease.  ALLERGIES:  CODEINE, MORPHINE, ZINC.  MEDICATIONS: 1. Coumadin. 2. Atenolol 100 mg b.i.d. 3. Amiodarone 100 mg daily. 4. Lasix 20 mg Monday, Wednesday, Friday. 5. Lipitor 40 mg most days of the week. 6. Losartan 100 mg daily. 7. Hydrochlorothiazide 25 mg daily. 8. Zoloft 50 mg daily. 9. Lyrica 75 mg daily. 10.Synthroid 50 mcg daily. 11.Potassium supplementation daily.  REVIEW OF SYSTEMS:  As in HPI.  The patient also reports increased fatigue over the past week.  All other systems were reviewed and  are negative.  PHYSICAL EXAMINATION:  VITAL SIGNS:  Temperature 98.3, blood pressure 150/70, heart rate is 75, sating 98% on 2 L. GENERAL:  No acute distress. HEENT:  Normocephalic, atraumatic. NECK:  There is some JVD. HEART:  Irregularly regular without murmur. LUNGS:  Bibasilar crackles. ABDOMEN:  Soft, nontender. EXTREMITIES:  Without edema. SKIN:  Warm and dry. PSYCHIATRIC:  The patient is appropriate. NEUROLOGIC:  Nonfocal. MUSCULOSKELETAL:  5/5 bilateral upper and lower extremity strength.  LABORATORY DATA:  Sodium 140, potassium 3.5, chloride 105, CO2 of 21, BUN 17, creatinine 1.1, glucose 97.  White count 6.4, hemoglobin 13.4, hematocrit 39.5, platelet count 191.  BNP 3237.  Troponin less than 0.30, INR 1.7.  Chest x-ray shows no acute disease.  EKG independently reviewed by myself demonstrates atrial fibrillation with a ventricular rate of 98 beats per minute.  There are nonspecific T-wave abnormalities.  ASSESSMENT: 1. Atrial fibrillation that, per her symptoms, appears to be not     ideally rate controlled. 2. Acute diastolic congestive heart failure.  The patient's last     echocardiogram was in 2008, which showed a normal ejection     fraction.  She does appear mildly volume overloaded on examination     today. 3. Hypertension. 4. Hypokalemia.  PLAN:  The patient will be admitted to telemetry unit.  She will be diuresed with IV Lasix and her potassium will be replaced and monitor. I will increase her amiodarone to 200 mg daily with the hopes of improving her atrial fibrillation rate control.  I will check a transthoracic echocardiogram to assure that she still has a normal ejection fraction.  We will also check a TSH to assure adequate thyroid replacement.     Henderson Cloud, MD   ______________________________ Cassell Clement, M.D.    SGA/MEDQ  D:  03/29/2011  T:  03/30/2011  Job:  [id number]  Electronically Signed by Cassell Clement M.D. on  04/05/2011 12:44:05 PM

## 2011-04-07 NOTE — Telephone Encounter (Signed)
No note needed for this encounter

## 2011-04-08 ENCOUNTER — Encounter: Payer: 59 | Admitting: *Deleted

## 2011-04-22 ENCOUNTER — Encounter (INDEPENDENT_AMBULATORY_CARE_PROVIDER_SITE_OTHER): Payer: 59 | Admitting: *Deleted

## 2011-04-22 ENCOUNTER — Encounter: Payer: Self-pay | Admitting: Cardiology

## 2011-04-22 ENCOUNTER — Ambulatory Visit (INDEPENDENT_AMBULATORY_CARE_PROVIDER_SITE_OTHER): Payer: 59 | Admitting: Cardiology

## 2011-04-22 VITALS — BP 140/80 | HR 80 | Wt 147.0 lb

## 2011-04-22 DIAGNOSIS — E039 Hypothyroidism, unspecified: Secondary | ICD-10-CM

## 2011-04-22 DIAGNOSIS — E785 Hyperlipidemia, unspecified: Secondary | ICD-10-CM

## 2011-04-22 DIAGNOSIS — Z9889 Other specified postprocedural states: Secondary | ICD-10-CM

## 2011-04-22 DIAGNOSIS — I4891 Unspecified atrial fibrillation: Secondary | ICD-10-CM

## 2011-04-22 DIAGNOSIS — Z8774 Personal history of (corrected) congenital malformations of heart and circulatory system: Secondary | ICD-10-CM | POA: Insufficient documentation

## 2011-04-22 DIAGNOSIS — I119 Hypertensive heart disease without heart failure: Secondary | ICD-10-CM

## 2011-04-22 NOTE — Assessment & Plan Note (Signed)
The patient has a thoracotomy scar secondary to open heart surgery at age 77 for what sounds like a surgical closure of a patent ductus arteriosus.

## 2011-04-22 NOTE — Assessment & Plan Note (Signed)
The patient has had no recurrence of her atrial fibrillation since 03/29/11.  She took higher doses of the amiodarone when she first went home and now is back to her maintenance dose.  She remains on long-term Coumadin.

## 2011-04-22 NOTE — Assessment & Plan Note (Signed)
The patient has a history of hypothyroidism and is on thyroid replacement therapy.  She takes Synthroid one tablet on Saturday and a half a tablet 3 days a week

## 2011-04-22 NOTE — Progress Notes (Signed)
[name] Date of Birth:  01/02/1934 [facility] / [facility] [address]   [address] [place], [place]  [address] [phone]           Fax   [phone]  History of Present Illness: This pleasant 77 year old woman is seen for a scheduled followup office visit.  She has a past history of paroxysmal atrial fibrillation.  She has a history of nonobstructive coronary disease by cardiac catheterization in 2008.  She has a history of tachybradycardia syndrome and is status post Medtronic dual-lead permanent pacemaker in 2011.  She has a history of moderate aortic stenosis and mild to moderate aortic insufficiency by echocardiogram.  She has a remote history of repair of what sounds like a patent ductus arteriosus done in [place] when the patient was about 77 years old.  Since we last saw the patient she was readmitted to the hospital with paroxysmal atrial fibrillation on 03/29/11-03/31/11.  While in the hospital her Coumadin was adjusted and increased.  By July 25 she had spontaneously converted back to normal sinus rhythm and was discharged.  She has not been aware of any palpitations since that time.  She's not having any chest pain or angina.  Current Outpatient Prescriptions  Medication Sig Dispense Refill  . amiodarone (PACERONE) 200 MG tablet Take 200 mg by mouth. taking one half tab daily      . Ascorbic Acid (VITAMIN C PO) Take by mouth daily.       [name] atenolol (TENORMIN) 100 MG tablet 1 tab po bid       . atorvastatin (LIPITOR) 40 MG tablet Take 20 mg by mouth daily.       . Calcium Carbonate-Vitamin D (CALCIUM + D PO) Take by mouth daily.        . Cholecalciferol (VITAMIN D-3) 5000 UNITS TABS 1 tab po qd       . Cyanocobalamin (B-12 PO) Take by mouth daily.       . Famotidine (PEPCID PO) Take by mouth daily.        . furosemide (LASIX) 40 MG tablet Take 20 mg by mouth 3 (three) times a week. 1/2 tab po qd      . levothyroxine (SYNTHROID, LEVOTHROID)  100 MCG tablet 1/2 tab po qd      . losartan-hydrochlorothiazide (HYZAAR) 100-25 MG per tablet 1/2 tab po qd      . MAGNESIUM PO Take by mouth 3 (three) times a week.        . nitroGLYCERIN (NITRODUR - DOSED IN MG/24 HR) 0.4 mg/hr Place 1 patch onto the skin daily.        . nitroGLYCERIN (NITROSTAT) 0.4 MG SL tablet Place 0.4 mg under the tongue every 5 (five) minutes as needed.        . Omega-3 Fatty Acids (FISH OIL PO) Take by mouth daily.       . potassium chloride SA (K-DUR,KLOR-CON) 20 MEQ tablet Take 20 mEq by mouth 2 (two) times daily.       . pregabalin (LYRICA) 75 MG capsule Take 75 mg by mouth 2 (two) times daily.       . sertraline (ZOLOFT) 100 MG tablet Take 100 mg by mouth daily.        [name] warfarin (COUMADIN) 2 MG tablet Take 2 mg by mouth as directed.          Allergies  Allergen Reactions  . Codeine   . Zinc  nausea    Patient Active Problem List  Diagnoses  . RENAL CELL CANCER  . BENIGN NEOPLASM OTH&UNSPEC SITE DIGESTIVE SYSTEM  . HYPOTHYROIDISM  . HYPERLIPIDEMIA  . ANXIETY  . DEPRESSION  . RESTLESS LEG SYNDROME  . HYPERTENSION  . GASTROPARESIS  . RADIATION PROCTITIS  . ANAL FISSURE  . FIBROCYSTIC BREAST DISEASE  . OSTEOMYELITIS  . CARCINOMA, UTERUS, HX OF  . RADIATION THERAPY, HX OF  . Atrial fibrillation  . Tachycardia-bradycardia syndrome    History  Smoking status  . Former Smoker  Smokeless tobacco  . Never Used    History  Alcohol Use No    Family History  Problem Relation Age of Onset  . Cancer Mother   . Heart disease Mother   . Heart disease Father   . Heart attack Father   . Heart disease Maternal Grandmother     Review of Systems: Constitutional: no fever chills diaphoresis or fatigue or change in weight.  Head and neck: no hearing loss, no epistaxis, no photophobia or visual disturbance. Respiratory: No cough, shortness of breath or wheezing. Cardiovascular: No chest pain peripheral edema, palpitations. Gastrointestinal:  No abdominal distention, no abdominal pain, no change in bowel habits hematochezia or melena. Genitourinary: No dysuria, no frequency, no urgency, no nocturia. Musculoskeletal:No arthralgias, no back pain, no gait disturbance or myalgias. Neurological: No dizziness, no headaches, no numbness, no seizures, no syncope, no weakness, no tremors. Hematologic: No lymphadenopathy, no easy bruising. Psychiatric: No confusion, no hallucinations, no sleep disturbance.    Physical Exam: Filed Vitals:   04/22/11 0833  BP: 140/80  Pulse: 80  The general appearance reveals a well-developed well-nourished woman in no distress.Pupils equal and reactive.   Extraocular Movements are full.  There is no scleral icterus.  The mouth and pharynx are normal.  The neck is supple.  The carotids reveal no bruits.  The jugular venous pressure is normal.  The thyroid is not enlarged.  There is no lymphadenopathy.  The chest is clear to percussion and auscultation. There are no rales or rhonchi. Expansion of the chest is symmetrical.  The precordium is quiet.  The first heart sound is normal.  The second heart sound is physiologically split.  There is no murmur gallop rub or click.  There is no abnormal lift or heave.The rhythm is regular today. The abdomen is soft and nontender. Bowel sounds are normal. The liver and spleen are not enlarged. There Are no abdominal masses. There are no bruits.  The pedal pulses are good.  There is no phlebitis or edema.  There is no cyanosis or clubbing.  Strength is normal and symmetrical in all extremities.  There is no lateralizing weakness.  There are no sensory deficits.  The skin is warm and dry.  There is no rash.       Assessment / Plan:  Continue present medication.We'll see her in 3 months for a followup office visit and will get followup lab work to check on her chronic amiodarone usage.

## 2011-04-22 NOTE — Assessment & Plan Note (Signed)
The patient has a past history of hypercholesterolemia.  She is on Lipitor40 mg one half tablet daily.Her lipids have been followed by her primary care physician

## 2011-04-27 NOTE — Discharge Summary (Signed)
[name], Valerie NO.:  [id number]  MEDICAL RECORD NO.:  [id number]  LOCATION:  2038                         FACILITY:  [facility]  PHYSICIAN:  Noralyn Pick. Eden Emms, MD, FACCDATE OF BIRTH:  1934/03/26  DATE OF ADMISSION:  03/29/2011 DATE OF DISCHARGE:  03/31/2011                              DISCHARGE SUMMARY   PROCEDURES: 1. Two-view chest x-ray. 2. A 2-D echocardiogram.  PRIMARY/FINAL DISCHARGE DIAGNOSIS:  Paroxysmal atrial fibrillation.  SECONDARY DIAGNOSES: 1. Nonobstructive coronary artery disease by catheterization in 2008. 2. Acute diastolic congestive heart failure. 3. Hypertension. 4. Hypokalemia. 5. Anticoagulation with Coumadin. 6. Hypothyroidism. 7. History of posterior descending artery with repair at age 47. 63. History of tachy-brady syndrome, status post Medtronic dual-lead     permanent pacemaker in 2011. 9. History of moderate aortic stenosis and mild-to-moderate aortic  insufficiency by echocardiogram in 2008, repeat echocardiogram     results pending. 10.Allergy or intolerance to CODEINE, MORPHINE, and ZINC.  TIME AT DISCHARGE:  43 minutes.  HOSPITAL COURSE:  [name] is a 77 year old female with a history of PAF.  She was having increased dyspnea on exertion.  She went to see her family physician and was felt to be in heart failure as well as AFib. She was sent to the hospital and admitted for further evaluation and treatment.  Her INR was 1.73 on admission and 1.66 at discharge.  She received IV Lasix, and her creatinine went up slightly from 1.08 to 1.19.  Her cardiac enzymes were cycled and were negative for MI.  Her BNP was elevated at 3237.  TSH was within normal limits at 1.995.  Her amiodarone was increased for better rate control.  Her Coumadin was increased.  By March 31, 2011, she had spontaneously converted to sinus rhythm.  Her volume status was felt to be at baseline, and Dr. Eden Emms felt that [name] could be  considered stable for discharge, to follow up as an outpatient.  DISCHARGE INSTRUCTIONS: 1. Her activity level is to be increased gradually. 2. She is encouraged to stick to a low-sodium, heart-healthy diet. 3. She is to follow up with Norma Fredrickson for Dr. Patty Sermons on April 22, 2011, at 8:30. 4. She is to follow up with Dr. Milinda Cave as needed. 5. She is to get a Coumadin check next week.  DISCHARGE MEDICATIONS: 1. Hyzaar 100/25 daily. 2. Amiodarone 200 mg b.i.d. for 2 weeks and 1 tablet daily. 3. Coumadin 2 mg 1 tablet daily except 2 tablets on Thursday. 4. Lyrica 75 mg b.i.d. 5. Sertraline 50 mg a day. 6. Fish oil 1000 mg a day. 7. Atenolol 100 mg b.i.d. 8. Lipitor 80 mg one-half tablet daily. 9. Aspirin 81 mg a day. 10.Potassium 20 mEq a day. 11.Calcium 600 mg q.a.m. 12.Magnesium 25 mg q.p.m. 13.Synthroid 1 tablet on Saturday and a half tablet on Monday,     Wednesday, and Friday. 14.Acid reducer OTC daily. 15.Vitamin B complex with C daily. 16.B12 250 mg 2 tablets daily. 17.Vitamin D2 5000 units daily.     Theodore Demark, PA-C   ______________________________ Noralyn Pick. Eden Emms, MD, [facility]    RB/MEDQ  D:  03/31/2011  T:  03/31/2011  Job:  [id number]  cc:   Dr. Milinda Cave  Electronically Signed by Theodore Demark PA-C on 04/08/2011 06:45:52 AM Electronically Signed by Charlton Haws MD [facility] on 04/27/2011 09:20:52 AM

## 2011-05-05 ENCOUNTER — Telehealth: Payer: Self-pay | Admitting: Cardiology

## 2011-05-05 NOTE — Telephone Encounter (Signed)
She should take some sublingual nitroglycerin to see if that will help the indigestion type pain.  She can also try some plain antacid

## 2011-05-05 NOTE — Telephone Encounter (Signed)
Called because her blood pressure is high 2:49 pm left arm 112/63/92 and she felt sleepy. She would like to know what to do to keep from going to the emergency room. Please call back.

## 2011-05-05 NOTE — Telephone Encounter (Signed)
When called patient back stated her blood pressure was 120's/40's and heart rate 84.  Asked if she was having any chest pains, stated she was having tightness like indigestion.  States shortness of breath occurs when she has this.  Does have on her nitorglycerin patch.  Has not taken anything for her indigestion.  Please advise

## 2011-05-05 NOTE — Telephone Encounter (Signed)
Called patient and she took tums and was feeling better.  Advised ok to use ntg and tums prn

## 2011-05-27 LAB — URINALYSIS, ROUTINE W REFLEX MICROSCOPIC
Glucose, UA: NEGATIVE
Nitrite: NEGATIVE
Protein, ur: NEGATIVE
Urobilinogen, UA: 0.2

## 2011-05-27 LAB — BASIC METABOLIC PANEL
BUN: 14
GFR calc non Af Amer: 59 — ABNORMAL LOW
Potassium: 3.7
Sodium: 146 — ABNORMAL HIGH

## 2011-05-27 LAB — APTT: aPTT: 28

## 2011-05-27 LAB — CBC
HCT: 37.9
Hemoglobin: 13.1
Platelets: 228
WBC: 7

## 2011-05-27 LAB — DIFFERENTIAL
Eosinophils Relative: 2
Lymphocytes Relative: 22
Lymphs Abs: 1.5

## 2011-05-27 LAB — PROTIME-INR
INR: 1
Prothrombin Time: 12.9

## 2011-05-27 LAB — NO BLOOD PRODUCTS

## 2011-05-28 ENCOUNTER — Ambulatory Visit (INDEPENDENT_AMBULATORY_CARE_PROVIDER_SITE_OTHER): Payer: 59 | Admitting: *Deleted

## 2011-05-28 DIAGNOSIS — I4891 Unspecified atrial fibrillation: Secondary | ICD-10-CM

## 2011-05-28 LAB — BASIC METABOLIC PANEL
CO2: 24
CO2: 26
Calcium: 8.2 — ABNORMAL LOW
Calcium: 8.4
Chloride: 106
Chloride: 109
GFR calc Af Amer: >60
Glucose, Bld: 97
Sodium: 138
Sodium: 140

## 2011-05-28 LAB — POCT INR: INR: 2

## 2011-05-28 LAB — CBC
HCT: 27.6 — ABNORMAL LOW
Hemoglobin: 10.6 — ABNORMAL LOW
Hemoglobin: 9.6 — ABNORMAL LOW
MCHC: 34.5
MCHC: 34.8
MCV: 90.7
RBC: 3.42 — ABNORMAL LOW
RDW: 15.2

## 2011-06-01 LAB — COMPREHENSIVE METABOLIC PANEL
ALT: 18
AST: 21
Albumin: 2.9 — ABNORMAL LOW
Alkaline Phosphatase: 114
BUN: 13
CO2: 27
Calcium: 8.9
Chloride: 105
Creatinine, Ser: 1.11
GFR calc Af Amer: 59 — ABNORMAL LOW
GFR calc non Af Amer: 48 — ABNORMAL LOW
Glucose, Bld: 105 — ABNORMAL HIGH
Potassium: 4.1
Sodium: 140
Total Bilirubin: 0.6
Total Protein: 6.1

## 2011-06-01 LAB — BASIC METABOLIC PANEL
BUN: 10
BUN: 14
CO2: 23
CO2: 24
Calcium: 8.8
Calcium: 9.2
Chloride: 111
Chloride: 112
Creatinine, Ser: 0.92
Creatinine, Ser: 0.94
GFR calc Af Amer: >60
GFR calc Af Amer: >60
GFR calc non Af Amer: 59 — ABNORMAL LOW
GFR calc non Af Amer: >60
Glucose, Bld: 85
Glucose, Bld: 94
Potassium: 3.6
Potassium: 3.7
Sodium: 144
Sodium: 145

## 2011-06-01 LAB — DIFFERENTIAL
Basophils Absolute: 0
Basophils Relative: 1
Eosinophils Absolute: 0.4
Eosinophils Relative: 6 — ABNORMAL HIGH
Lymphocytes Relative: 38
Lymphs Abs: 2.6
Monocytes Absolute: 0.6
Monocytes Relative: 8
Neutro Abs: 3.1
Neutrophils Relative %: 47

## 2011-06-01 LAB — CBC
HCT: 36.5
HCT: 38.8
Hemoglobin: 12.5
Hemoglobin: 13.1
MCHC: 33.8
MCHC: 34.2
MCV: 91
MCV: 91.6
Platelets: 165
Platelets: 214
RBC: 4.01
RBC: 4.24
RDW: 13.3
RDW: 13.8
WBC: 6.6
WBC: 6.7

## 2011-06-01 LAB — TSH: TSH: 1.217

## 2011-06-01 LAB — MAGNESIUM: Magnesium: 1.8

## 2011-06-01 LAB — PROTIME-INR
INR: 1
INR: 1.1
Prothrombin Time: 13.4

## 2011-06-01 LAB — POCT CARDIAC MARKERS
CKMB, poc: 1.1
Myoglobin, poc: 80.9
Operator id: 151321
Troponin i, poc: <0.05

## 2011-06-01 LAB — CARDIAC PANEL(CRET KIN+CKTOT+MB+TROPI)
CK, MB: 1.3
CK, MB: 1.5
Relative Index: INVALID
Relative Index: INVALID
Relative Index: INVALID
Total CK: 63
Total CK: 73
Troponin I: 0.02
Troponin I: 0.02

## 2011-06-01 LAB — D-DIMER, QUANTITATIVE: D-Dimer, Quant: 0.84 — ABNORMAL HIGH

## 2011-06-01 LAB — APTT: aPTT: 27

## 2011-06-01 LAB — B-NATRIURETIC PEPTIDE (CONVERTED LAB): Pro B Natriuretic peptide (BNP): 580 — ABNORMAL HIGH

## 2011-06-11 LAB — BASIC METABOLIC PANEL
BUN: 15 mg/dL (ref 6–23)
BUN: 17 mg/dL (ref 6–23)
CO2: 26 mEq/L (ref 19–32)
CO2: 28 mEq/L (ref 19–32)
Chloride: 104 mEq/L (ref 96–112)
Chloride: 108 mEq/L (ref 96–112)
Creatinine, Ser: 1.19 mg/dL (ref 0.4–1.2)
Creatinine, Ser: 1.21 mg/dL — ABNORMAL HIGH (ref 0.4–1.2)

## 2011-06-11 LAB — POCT CARDIAC MARKERS
Myoglobin, poc: 81.8 ng/mL (ref 12–200)
Myoglobin, poc: 90.8 ng/mL (ref 12–200)
Troponin i, poc: <0.05 ng/mL (ref 0.00–0.09)
Troponin i, poc: <0.05 ng/mL (ref 0.00–0.09)

## 2011-06-11 LAB — COMPREHENSIVE METABOLIC PANEL
ALT: 22 U/L (ref 0–35)
AST: 37 U/L (ref 0–37)
CO2: 27 mEq/L (ref 19–32)
Chloride: 106 mEq/L (ref 96–112)
GFR calc Af Amer: 51 mL/min — ABNORMAL LOW (ref >60)
GFR calc non Af Amer: 42 mL/min — ABNORMAL LOW (ref >60)
Sodium: 141 mEq/L (ref 135–145)
Total Bilirubin: 1.4 mg/dL — ABNORMAL HIGH (ref 0.3–1.2)

## 2011-06-11 LAB — DIFFERENTIAL
Basophils Absolute: 0 10*3/uL (ref 0.0–0.1)
Basophils Relative: 0 % (ref 0–1)
Eosinophils Absolute: 0.2 10*3/uL (ref 0.0–0.7)
Eosinophils Relative: 3 % (ref 0–5)
Lymphocytes Relative: 27 % (ref 12–46)
Lymphs Abs: 1.6 K/uL (ref 0.7–4.0)
Monocytes Absolute: 0.5 10*3/uL (ref 0.1–1.0)
Monocytes Relative: 10 % (ref 3–12)
Neutro Abs: 3.5 K/uL (ref 1.7–7.7)
Neutrophils Relative %: 60 % (ref 43–77)

## 2011-06-11 LAB — BASIC METABOLIC PANEL WITH GFR
Calcium: 8.9 mg/dL (ref 8.4–10.5)
GFR calc Af Amer: 53 mL/min — ABNORMAL LOW (ref >60)
GFR calc non Af Amer: 44 mL/min — ABNORMAL LOW (ref >60)
Glucose, Bld: 96 mg/dL (ref 70–99)
Potassium: 3.6 meq/L (ref 3.5–5.1)
Sodium: 142 meq/L (ref 135–145)

## 2011-06-11 LAB — CBC
HCT: 40.3 % (ref 36.0–46.0)
Hemoglobin: 13.4 g/dL (ref 12.0–15.0)
MCHC: 33.3 g/dL (ref 30.0–36.0)
MCV: 90.4 fL (ref 78.0–100.0)
Platelets: 198 K/uL (ref 150–400)
RBC: 4.47 MIL/uL (ref 3.87–5.11)
RDW: 14.4 % (ref 11.5–15.5)
WBC: 5.8 K/uL (ref 4.0–10.5)

## 2011-06-11 LAB — CARDIAC PANEL(CRET KIN+CKTOT+MB+TROPI)
CK, MB: 1.9 ng/mL (ref 0.3–4.0)
CK, MB: 2.8 ng/mL (ref 0.3–4.0)
Relative Index: 1.8 (ref 0.0–2.5)
Total CK: 130 U/L (ref 7–177)
Troponin I: <0.01 ng/mL (ref 0.00–0.06)

## 2011-06-11 LAB — PROTIME-INR
INR: 2.4 — ABNORMAL HIGH (ref 0.00–1.49)
Prothrombin Time: 23.3 seconds — ABNORMAL HIGH (ref 11.6–15.2)
Prothrombin Time: 27.9 s — ABNORMAL HIGH (ref 11.6–15.2)

## 2011-06-11 LAB — APTT: aPTT: 41 s — ABNORMAL HIGH (ref 24–37)

## 2011-06-17 LAB — CBC
HCT: 38.1
HCT: 39.6
Hemoglobin: 12.8
Hemoglobin: 14.8
MCHC: 34
MCHC: 34
MCHC: 34.1
MCV: 91
MCV: 91.5
MCV: 91.8
MCV: 91.9
Platelets: 209
Platelets: 214
RBC: 4.14
RBC: 4.77
WBC: 6.6

## 2011-06-17 LAB — BASIC METABOLIC PANEL
BUN: 11
BUN: 13
BUN: 14
CO2: 26
CO2: 28
CO2: 28
Calcium: 8.8
Chloride: 109
Chloride: 110
Chloride: 111
Creatinine, Ser: 1.12
Creatinine, Ser: 1.2
GFR calc Af Amer: 54 — ABNORMAL LOW
GFR calc non Af Amer: 49 — ABNORMAL LOW
Glucose, Bld: 109 — ABNORMAL HIGH
Glucose, Bld: 95
Potassium: 4.2
Potassium: 4.8

## 2011-06-17 LAB — DIFFERENTIAL
Basophils Relative: 0
Eosinophils Relative: 3
Lymphocytes Relative: 32
Lymphs Abs: 2.5
Monocytes Absolute: 0.6
Monocytes Absolute: 0.8 — ABNORMAL HIGH
Monocytes Relative: 9
Neutro Abs: 4.9

## 2011-06-17 LAB — TSH: TSH: 1.343

## 2011-06-17 LAB — POCT CARDIAC MARKERS
Myoglobin, poc: 106
Operator id: 279831

## 2011-06-17 LAB — COMPREHENSIVE METABOLIC PANEL
AST: 63 — ABNORMAL HIGH
Albumin: 3.1 — ABNORMAL LOW
Calcium: 8.7
Creatinine, Ser: 0.9
GFR calc Af Amer: >60
GFR calc non Af Amer: >60

## 2011-06-17 LAB — PROTIME-INR: INR: 0.9

## 2011-06-17 LAB — CK TOTAL AND CKMB (NOT AT ARMC)
CK, MB: 3.2
Relative Index: INVALID
Relative Index: INVALID
Total CK: 74
Total CK: 97

## 2011-06-17 LAB — TROPONIN I
Troponin I: 0.05
Troponin I: 0.13 — ABNORMAL HIGH

## 2011-06-17 LAB — CARDIAC PANEL(CRET KIN+CKTOT+MB+TROPI)
CK, MB: 2.7
Troponin I: 0.05

## 2011-06-17 LAB — I-STAT 8, (EC8 V) (CONVERTED LAB)
BUN: 14
Chloride: 108
Glucose, Bld: 111 — ABNORMAL HIGH
Potassium: 3.1 — ABNORMAL LOW
TCO2: 27
pH, Ven: 7.44 — ABNORMAL HIGH

## 2011-06-17 LAB — HEPARIN LEVEL (UNFRACTIONATED)
Heparin Unfractionated: 0.46
Heparin Unfractionated: 0.75 — ABNORMAL HIGH

## 2011-06-17 LAB — B-NATRIURETIC PEPTIDE (CONVERTED LAB): Pro B Natriuretic peptide (BNP): 318 — ABNORMAL HIGH

## 2011-06-17 LAB — POCT I-STAT CREATININE
Creatinine, Ser: 0.9
Operator id: 279831

## 2011-06-17 LAB — T4, FREE: Free T4: 1.59

## 2011-06-21 LAB — URINE MICROSCOPIC-ADD ON

## 2011-06-21 LAB — BASIC METABOLIC PANEL
BUN: 14
CO2: 22
Calcium: 8.3 — ABNORMAL LOW
Chloride: 111
Creatinine, Ser: 1.49 — ABNORMAL HIGH
GFR calc Af Amer: 42 — ABNORMAL LOW
GFR calc non Af Amer: 34 — ABNORMAL LOW
Glucose, Bld: 137 — ABNORMAL HIGH
Glucose, Bld: 87
Potassium: 4
Sodium: 136

## 2011-06-21 LAB — URINALYSIS, ROUTINE W REFLEX MICROSCOPIC
Bilirubin Urine: NEGATIVE
Glucose, UA: NEGATIVE
Hgb urine dipstick: NEGATIVE
Ketones, ur: NEGATIVE
Nitrite: NEGATIVE
Protein, ur: NEGATIVE
Specific Gravity, Urine: 1.01

## 2011-06-21 LAB — URINE CULTURE
Colony Count: 60000
Colony Count: >=100000
Special Requests: NEGATIVE

## 2011-06-21 LAB — DIFFERENTIAL
Basophils Absolute: 0
Basophils Absolute: 0
Basophils Relative: 1
Eosinophils Absolute: 0.3
Eosinophils Relative: 6 — ABNORMAL HIGH
Lymphocytes Relative: 28
Lymphocytes Relative: 35
Monocytes Absolute: 0.5
Neutro Abs: 4.2
Neutrophils Relative %: 49
Neutrophils Relative %: 60

## 2011-06-21 LAB — CBC
HCT: 35.7 — ABNORMAL LOW
Hemoglobin: 12.2
Hemoglobin: 13
MCHC: 33.8
MCHC: 34.3
MCV: 88.5
RBC: 4.03
RDW: 13.8
WBC: 5.6

## 2011-06-21 LAB — COMPREHENSIVE METABOLIC PANEL
AST: 30
CO2: 23
Chloride: 110
Creatinine, Ser: 1.11
GFR calc Af Amer: 59 — ABNORMAL LOW
GFR calc non Af Amer: 48 — ABNORMAL LOW
Glucose, Bld: 83
Total Bilirubin: 0.6

## 2011-06-21 LAB — CK TOTAL AND CKMB (NOT AT ARMC)
CK, MB: 1.4
Relative Index: INVALID
Relative Index: INVALID
Total CK: 75
Total CK: 81

## 2011-06-21 LAB — LIPID PANEL
Cholesterol: 159
HDL: 33 — ABNORMAL LOW
LDL Cholesterol: 81
Total CHOL/HDL Ratio: 4.8

## 2011-06-21 LAB — TSH: TSH: 0.91

## 2011-06-21 LAB — STOOL CULTURE

## 2011-06-21 LAB — FECAL LACTOFERRIN, QUANT: Fecal Lactoferrin: NEGATIVE

## 2011-06-21 LAB — POCT CARDIAC MARKERS
CKMB, poc: <1 — ABNORMAL LOW
Myoglobin, poc: 85.8

## 2011-06-21 LAB — TROPONIN I: Troponin I: 0.01

## 2011-06-23 ENCOUNTER — Ambulatory Visit (INDEPENDENT_AMBULATORY_CARE_PROVIDER_SITE_OTHER): Payer: 59 | Admitting: *Deleted

## 2011-06-23 ENCOUNTER — Encounter: Payer: Self-pay | Admitting: Internal Medicine

## 2011-06-23 ENCOUNTER — Ambulatory Visit (INDEPENDENT_AMBULATORY_CARE_PROVIDER_SITE_OTHER): Payer: 59 | Admitting: Internal Medicine

## 2011-06-23 DIAGNOSIS — I4891 Unspecified atrial fibrillation: Secondary | ICD-10-CM

## 2011-06-23 DIAGNOSIS — I495 Sick sinus syndrome: Secondary | ICD-10-CM

## 2011-06-23 LAB — POCT INR: INR: 2.1

## 2011-06-23 NOTE — Assessment & Plan Note (Signed)
Normal pacemaker function See Pace Art report No changes today  

## 2011-06-23 NOTE — Patient Instructions (Signed)
Your physician wants you to follow-up in: 12 months with Dr Jacquiline Doe will receive a reminder letter in the mail two months in advance. If you don't receive a letter, please call our office to schedule the follow-up appointment.   Remote monitoring is used to monitor your Pacemaker of ICD from home. This monitoring reduces the number of office visits required to check your device to one time per year. It allows [place] to keep an eye on the functioning of your device to ensure it is working properly. You are scheduled for a device check from home on 09/23/2011. You may send your transmission at any time that day. If you have a wireless device, the transmission will be sent automatically. After your physician reviews your transmission, you will receive a postcard with your next transmission date.

## 2011-06-23 NOTE — Progress Notes (Signed)
[name] is a pleasant 77 y.o. yo patient with a h/o atrial fibrillation and tachycardia/ bradycardia syndrome sp PPM (MDT) by Dr Deborah Chalk  who presents today for follow-up in the Electrophysiology device clinic.  She presented with tachycardia/ bradycardia syndrome and underwent PPM by Dr Deborah Chalk 07/03/10.  The patient reports doing very well since having a pacemaker implanted and remains very active despite her age.  Today, she  denies symptoms of palpitations, chest pain, shortness of breath, orthopnea, PND, lower extremity edema, dizziness, presyncope, syncope, or neurologic sequela.  The patientis tolerating medications without difficulties and is otherwise without complaint today.   Past Medical History  Diagnosis Date  . PAF (paroxysmal atrial fibrillation)     controlled with amiodarone, on coumadin  . CAD (coronary artery disease)   . Aortic root dilatation   . Transitional cell carcinoma   . History of uterine cancer   . HTN (hypertension)   . Hyperlipemia   . Hypothyroidism   . Moderate aortic stenosis   . Chronic anticoagulation   . Tachycardia-bradycardia syndrome     s/p PPM by Dr Deborah Chalk (MDT) 07/03/10    Past Surgical History  Procedure Date  . Cardiac catheterization 2008  . Nephrectomy   . Total abdominal hysterectomy   . Patent ductus arterious repair age 5  . Ptvp   . Pacemaker insertion 07/04/11    by Dr Deborah Chalk    History   Social History  . Marital Status: Married    Spouse Name: N/A    Number of Children: N/A  . Years of Education: N/A   Occupational History  . Not on file.   Social History Main Topics  . Smoking status: Former Games developer  . Smokeless tobacco: Never Used  . Alcohol Use: No  . Drug Use: No  . Sexually Active: Not on file   Other Topics Concern  . Not on file   Social History Narrative   Lives in [place].  Continues to work.    Family History  Problem Relation Age of Onset  . Cancer Mother   . Heart disease  Mother   . Heart disease Father   . Heart attack Father   . Heart disease Maternal Grandmother     Allergies  Allergen Reactions  . Codeine   . Zinc     nausea    Current outpatient prescriptions:amiodarone (PACERONE) 200 MG tablet, Take 200 mg by mouth. taking one half tab daily, Disp: , Rfl: ;  Ascorbic Acid (VITAMIN C PO), Take by mouth daily. , Disp: , Rfl: ;  atenolol (TENORMIN) 100 MG tablet, 1 tab po bid , Disp: , Rfl: ;  atorvastatin (LIPITOR) 40 MG tablet, Take 20 mg by mouth daily. , Disp: , Rfl: ;  Calcium Carbonate-Vitamin D (CALCIUM + D PO), Take by mouth daily.  , Disp: , Rfl:  Cholecalciferol (VITAMIN D-3) 5000 UNITS TABS, 1 tab po qd , Disp: , Rfl: ;  Cyanocobalamin (B-12 PO), Take by mouth daily. , Disp: , Rfl: ;  Famotidine (PEPCID PO), Take by mouth daily.  , Disp: , Rfl: ;  furosemide (LASIX) 40 MG tablet, Take 20 mg by mouth 3 (three) times a week. 1/2 tab po qd, Disp: , Rfl: ;  levothyroxine (SYNTHROID, LEVOTHROID) 100 MCG tablet, 1/2 tab po qd, Disp: , Rfl:  losartan-hydrochlorothiazide (HYZAAR) 100-25 MG per tablet, 1/2 tab po qd, Disp: , Rfl: ;  MAGNESIUM PO, Take by mouth 3 (three) times a week.  ,  Disp: , Rfl: ;  nitroGLYCERIN (NITRODUR - DOSED IN MG/24 HR) 0.4 mg/hr, Place 1 patch onto the skin daily.  , Disp: , Rfl: ;  nitroGLYCERIN (NITROSTAT) 0.4 MG SL tablet, Place 0.4 mg under the tongue every 5 (five) minutes as needed.  , Disp: , Rfl:  Omega-3 Fatty Acids (FISH OIL PO), Take by mouth daily. , Disp: , Rfl: ;  potassium chloride SA (K-DUR,KLOR-CON) 20 MEQ tablet, Take 20 mEq by mouth 2 (two) times daily. , Disp: , Rfl: ;  pregabalin (LYRICA) 75 MG capsule, Take 75 mg by mouth 2 (two) times daily. , Disp: , Rfl: ;  sertraline (ZOLOFT) 100 MG tablet, Take 100 mg by mouth daily.  , Disp: , Rfl: ;  warfarin (COUMADIN) 2 MG tablet, Take 2 mg by mouth as directed.  , Disp: , Rfl:   Physical Exam: Filed Vitals:   06/23/11 1543  BP: 129/78  Pulse: 73  Height: 5\' 1"   (1.549 m)  Weight: 150 lb (68.04 kg)    GEN- The patient is well appearing, alert and oriented x 3 today.   Head- normocephalic, atraumatic Eyes-  Sclera clear, conjunctiva pink Ears- hearing intact Oropharynx- clear Neck- supple, no JVP Lymph- no cervical lymphadenopathy Lungs- Clear to ausculation bilaterally, normal work of breathing Chest- R sided pacemaker pocket is well healed Heart- Regular rate and rhythm, no murmurs, rubs or gallops, PMI not laterally displaced GI- soft, NT, ND, + BS Extremities- no clubbing, cyanosis, or edema

## 2011-06-23 NOTE — Assessment & Plan Note (Signed)
Stable No change required today  

## 2011-06-25 ENCOUNTER — Encounter: Payer: 59 | Admitting: *Deleted

## 2011-07-04 HISTORY — PX: PACEMAKER INSERTION: SHX728

## 2011-07-05 ENCOUNTER — Encounter: Payer: 59 | Admitting: Internal Medicine

## 2011-07-09 ENCOUNTER — Ambulatory Visit (INDEPENDENT_AMBULATORY_CARE_PROVIDER_SITE_OTHER): Payer: 59 | Admitting: *Deleted

## 2011-07-09 ENCOUNTER — Encounter: Payer: 59 | Admitting: *Deleted

## 2011-07-09 ENCOUNTER — Encounter: Payer: Self-pay | Admitting: Cardiology

## 2011-07-09 ENCOUNTER — Ambulatory Visit (INDEPENDENT_AMBULATORY_CARE_PROVIDER_SITE_OTHER): Payer: 59 | Admitting: Cardiology

## 2011-07-09 ENCOUNTER — Other Ambulatory Visit (INDEPENDENT_AMBULATORY_CARE_PROVIDER_SITE_OTHER): Payer: 59 | Admitting: *Deleted

## 2011-07-09 VITALS — BP 128/80 | HR 80 | Ht 61.0 in | Wt 150.0 lb

## 2011-07-09 DIAGNOSIS — I495 Sick sinus syndrome: Secondary | ICD-10-CM

## 2011-07-09 DIAGNOSIS — I119 Hypertensive heart disease without heart failure: Secondary | ICD-10-CM

## 2011-07-09 DIAGNOSIS — E7800 Pure hypercholesterolemia, unspecified: Secondary | ICD-10-CM

## 2011-07-09 DIAGNOSIS — I359 Nonrheumatic aortic valve disorder, unspecified: Secondary | ICD-10-CM

## 2011-07-09 DIAGNOSIS — I4891 Unspecified atrial fibrillation: Secondary | ICD-10-CM

## 2011-07-09 DIAGNOSIS — E785 Hyperlipidemia, unspecified: Secondary | ICD-10-CM

## 2011-07-09 DIAGNOSIS — I350 Nonrheumatic aortic (valve) stenosis: Secondary | ICD-10-CM | POA: Insufficient documentation

## 2011-07-09 DIAGNOSIS — I10 Essential (primary) hypertension: Secondary | ICD-10-CM

## 2011-07-09 DIAGNOSIS — M1990 Unspecified osteoarthritis, unspecified site: Secondary | ICD-10-CM | POA: Insufficient documentation

## 2011-07-09 LAB — CBC WITH DIFFERENTIAL/PLATELET
Basophils Relative: 0.5 % (ref 0.0–3.0)
Eosinophils Relative: 3 % (ref 0.0–5.0)
HCT: 39 % (ref 36.0–46.0)
MCV: 87 fl (ref 78.0–100.0)
Monocytes Absolute: 0.9 10*3/uL (ref 0.1–1.0)
Neutrophils Relative %: 59.8 % (ref 43.0–77.0)
RBC: 4.49 Mil/uL (ref 3.87–5.11)
WBC: 7.1 10*3/uL (ref 4.5–10.5)

## 2011-07-09 LAB — BASIC METABOLIC PANEL
BUN: 29 mg/dL — ABNORMAL HIGH (ref 6–23)
Calcium: 8.8 mg/dL (ref 8.4–10.5)
Creatinine, Ser: 1.6 mg/dL — ABNORMAL HIGH (ref 0.4–1.2)
GFR: 34.35 mL/min — ABNORMAL LOW (ref >60.00)
Potassium: 3.5 mEq/L (ref 3.5–5.1)

## 2011-07-09 LAB — HEPATIC FUNCTION PANEL
ALT: 26 U/L (ref 0–35)
AST: 32 U/L (ref 0–37)
Alkaline Phosphatase: 111 U/L (ref 39–117)
Bilirubin, Direct: 0.1 mg/dL (ref 0.0–0.3)
Total Protein: 7 g/dL (ref 6.0–8.3)

## 2011-07-09 NOTE — Assessment & Plan Note (Signed)
The patient has a history of moderate aortic stenosis and mild to moderate aortic insufficiency by echocardiogram done 12/15/09.  She has not been experiencing any symptoms from her aortic valve disease.  Specifically, no evidence of congestive heart failure or angina pectoris and no exertional dizziness or syncope.

## 2011-07-09 NOTE — Assessment & Plan Note (Signed)
Patient had recent complete lab panel at her primary care physicians and she states that her labs were excellent.  She was advised to reduce her Lipitor to just 4 days a week.  She skips the weekends

## 2011-07-09 NOTE — Progress Notes (Signed)
[name] Date of Birth:  02/15/1934 [facility] / [facility] [address]   [address] [place], [place]  [address] [phone]           Fax   [phone]  History of Present Illness: This pleasant 77 year old woman is seen for a scheduled followup office visit.  She has a past history of paroxysmal atrial fibrillation.  She has a history of nonobstructive coronary disease by cardiac catheterization in 2008.  She has a history of tachybradycardia syndrome and is status post Medtronic dual lead pacemaker in 2011.  She was about 77 years old.  She had a repair of a patent ductus arteriosus.  That surgery was done in [place], [place].  In July 2012.  She was readmitted to the hospital with paroxysmal atrial fibrillation and her Coumadin was adjusted.  By July 25 she has spontaneously converted back to sinus rhythm.  Since then.  She has had no recurrent atrial fibrillation.[name]  His history of moderate aortic stenosis and mild to moderate aortic insufficiency by echocardiogram.  Current Outpatient Prescriptions  Medication Sig Dispense Refill  . amiodarone (PACERONE) 200 MG tablet Take 200 mg by mouth. taking one half tab daily      . Ascorbic Acid (VITAMIN C PO) Take by mouth daily.       [name] atenolol (TENORMIN) 100 MG tablet 1 tab po bid       . atorvastatin (LIPITOR) 40 MG tablet Take 20 mg by mouth daily.       . Calcium Carbonate-Vitamin D (CALCIUM + D PO) Take by mouth daily.        . Cholecalciferol (VITAMIN D-3) 5000 UNITS TABS 1 tab po qd       . Cyanocobalamin (B-12 PO) Take by mouth daily.       . Famotidine (PEPCID PO) Take by mouth daily.        . furosemide (LASIX) 40 MG tablet Take 20 mg by mouth 3 (three) times a week. 1/2 tab 3 x a week      . levothyroxine (SYNTHROID, LEVOTHROID) 100 MCG tablet 1/2 tab po qd      . losartan-hydrochlorothiazide (HYZAAR) 100-25 MG per tablet daily.       [name] MAGNESIUM PO Take by mouth 3 (three) times a week.        .  nitroGLYCERIN (NITRODUR - DOSED IN MG/24 HR) 0.4 mg/hr Place 1 patch onto the skin daily.        . nitroGLYCERIN (NITROSTAT) 0.4 MG SL tablet Place 0.4 mg under the tongue every 5 (five) minutes as needed.        . Omega-3 Fatty Acids (FISH OIL PO) Take by mouth daily.       . potassium chloride SA (K-DUR,KLOR-CON) 20 MEQ tablet Take 20 mEq by mouth 2 (two) times daily.       . pregabalin (LYRICA) 75 MG capsule Take 75 mg by mouth 2 (two) times daily.       . sertraline (ZOLOFT) 100 MG tablet Take 100 mg by mouth daily.        [name] warfarin (COUMADIN) 2 MG tablet Take 2 mg by mouth as directed.          Allergies  Allergen Reactions  . Codeine   . Zinc     nausea    Patient Active Problem List  Diagnoses  . RENAL CELL CANCER  . BENIGN NEOPLASM OTH&UNSPEC SITE DIGESTIVE SYSTEM  . HYPOTHYROIDISM  .  HYPERLIPIDEMIA  . ANXIETY  . DEPRESSION  . RESTLESS LEG SYNDROME  . HYPERTENSION  . GASTROPARESIS  . RADIATION PROCTITIS  . ANAL FISSURE  . FIBROCYSTIC BREAST DISEASE  . OSTEOMYELITIS  . CARCINOMA, UTERUS, HX OF  . RADIATION THERAPY, HX OF  . Atrial fibrillation  . Tachycardia-bradycardia syndrome  . S/P repair of patent ductus arteriosus    History  Smoking status  . Former Smoker  Smokeless tobacco  . Never Used    History  Alcohol Use No    Family History  Problem Relation Age of Onset  . Cancer Mother   . Heart disease Mother   . Heart disease Father   . Heart attack Father   . Heart disease Maternal Grandmother     Review of Systems: Constitutional: no fever chills diaphoresis or fatigue or change in weight.  Head and neck: no hearing loss, no epistaxis, no photophobia or visual disturbance. Respiratory: No cough, shortness of breath or wheezing. Cardiovascular: No chest pain peripheral edema, palpitations. Gastrointestinal: No abdominal distention, no abdominal pain, no change in bowel habits hematochezia or melena. Genitourinary: No dysuria, no frequency,  no urgency, no nocturia. Musculoskeletal:No arthralgias, no back pain, no gait disturbance or myalgias. Neurological: No dizziness, no headaches, no numbness, no seizures, no syncope, no weakness, no tremors. Hematologic: No lymphadenopathy, no easy bruising. Psychiatric: No confusion, no hallucinations, no sleep disturbance.    Physical Exam: Filed Vitals:   07/09/11 1333  BP: 128/80  Pulse: 80   general appearance reveals a well-developed, well-nourished woman in no distress.Pupils equal and reactive.   Extraocular Movements are full.  There is no scleral icterus.  The mouth and pharynx are normal.  The neck is supple.  The carotids reveal no bruits.  The jugular venous pressure is normal.  The thyroid is not enlarged.  There is no lymphadenopathy.  The chest is clear to percussion and auscultation. There are no rales or rhonchi. Expansion of the chest is symmetrical.  Heart reveals a grade 2/6 systolic ejection murmur at the base.The abdomen is soft and nontender. Bowel sounds are normal. The liver and spleen are not enlarged. There Are no abdominal masses. There are no bruits.  The pedal pulses are good.  There is no phlebitis or edema.  There is no cyanosis or clubbing. Strength is normal and symmetrical in all extremities.  There is no lateralizing weakness.  There are no sensory deficits.  Integument   Assessment / Plan: Continue regular checkups at the Coumadin clinic.  Recheck here for followup office visit and EKG in 3 months

## 2011-07-09 NOTE — Assessment & Plan Note (Signed)
The patient has not been aware of any palpitations or tachycardia since last visit

## 2011-07-09 NOTE — Patient Instructions (Signed)
Your physician recommends that you continue on your current medications as directed. Please refer to the Current Medication list given to you today.  Your physician recommends that you schedule a follow-up appointment in: 3 months  

## 2011-07-09 NOTE — Assessment & Plan Note (Signed)
Patient has been expressing some pain in her right knee.  She saw her orthopedist, Dr. Charlann Boxer , who diagnosed a slight stress fracture of the knee.

## 2011-07-09 NOTE — Assessment & Plan Note (Signed)
Her blood pressure has been doing well on her present antihypertensive regimen.  He denies any dizziness, chest pain or shortness of breath.  No headaches.

## 2011-07-13 ENCOUNTER — Telehealth: Payer: Self-pay | Admitting: *Deleted

## 2011-07-13 DIAGNOSIS — E876 Hypokalemia: Secondary | ICD-10-CM

## 2011-07-13 MED ORDER — POTASSIUM CHLORIDE CRYS ER 20 MEQ PO TBCR: 20.0000 meq | EXTENDED_RELEASE_TABLET | Freq: Three times a day (TID) | ORAL | Status: DC

## 2011-07-13 NOTE — Telephone Encounter (Signed)
Message copied by Burnell Blanks on Tue Jul 13, 2011 10:38 AM ------      Message from: Cassell Clement      Created: Sun Jul 11, 2011  5:46 PM       Pl report.  Labs good except K stays low 3.5.  Increase KDur to TID.      Kidneys drier so increase water.

## 2011-07-13 NOTE — Telephone Encounter (Signed)
Advised of labs and mailed new Rx to patient

## 2011-07-27 ENCOUNTER — Encounter: Payer: Self-pay | Admitting: Gastroenterology

## 2011-07-27 ENCOUNTER — Ambulatory Visit (INDEPENDENT_AMBULATORY_CARE_PROVIDER_SITE_OTHER): Payer: 59 | Admitting: Gastroenterology

## 2011-07-27 DIAGNOSIS — R197 Diarrhea, unspecified: Secondary | ICD-10-CM

## 2011-07-27 DIAGNOSIS — D131 Benign neoplasm of stomach: Secondary | ICD-10-CM

## 2011-07-27 DIAGNOSIS — Z800 Family history of malignant neoplasm of digestive organs: Secondary | ICD-10-CM

## 2011-07-27 MED ORDER — HYOSCYAMINE SULFATE ER 0.375 MG PO TBCR: EXTENDED_RELEASE_TABLET | ORAL | Status: DC

## 2011-07-27 MED ORDER — PEG-KCL-NACL-NASULF-NA ASC-C 100 G PO SOLR: 1.0000 | Freq: Once | ORAL | Status: DC

## 2011-07-27 NOTE — Patient Instructions (Signed)
Colonoscopy A colonoscopy is an exam to evaluate your entire colon. In this exam, your colon is cleansed. A long fiberoptic tube is inserted through your rectum and into your colon. The fiberoptic scope (endoscope) is a long bundle of enclosed and very flexible fibers. These fibers transmit light to the area examined and send images from that area to your caregiver. Discomfort is usually minimal. You may be given a drug to help you sleep (sedative) during or prior to the procedure. This exam helps to detect lumps (tumors), polyps, inflammation, and areas of bleeding. Your caregiver may also take a small piece of tissue (biopsy) that will be examined under a microscope. LET YOUR CAREGIVER KNOW ABOUT:   Allergies to food or medicine.   Medicines taken, including vitamins, herbs, eyedrops, over-the-counter medicines, and creams.   Use of steroids (by mouth or creams).   Previous problems with anesthetics or numbing medicines.   History of bleeding problems or blood clots.   Previous surgery.   Other health problems, including diabetes and kidney problems.   Possibility of pregnancy, if this applies.  BEFORE THE PROCEDURE   A clear liquid diet may be required for 2 days before the exam.   Ask your caregiver about changing or stopping your regular medications.   Liquid injections (enemas) or laxatives may be required.   A large amount of electrolyte solution may be given to you to drink over a short period of time. This solution is used to clean out your colon.   You should be present 60 minutes prior to your procedure or as directed by your caregiver.  AFTER THE PROCEDURE   If you received a sedative or pain relieving medication, you will need to arrange for someone to drive you home.   Occasionally, there is a little blood passed with the first bowel movement. Do not be concerned.  FINDING OUT THE RESULTS OF YOUR TEST Not all test results are available during your visit. If your test  results are not back during the visit, make an appointment with your caregiver to find out the results. Do not assume everything is normal if you have not heard from your caregiver or the medical facility. It is important for you to follow up on all of your test results. HOME CARE INSTRUCTIONS   It is not unusual to pass moderate amounts of gas and experience mild abdominal cramping following the procedure. This is due to air being used to inflate your colon during the exam. Walking or a warm pack on your belly (abdomen) may help.   You may resume all normal meals and activities after sedatives and medicines have worn off.   Only take over-the-counter or prescription medicines for pain, discomfort, or fever as directed by your caregiver. Do not use aspirin or blood thinners if a biopsy was taken. Consult your caregiver for medicine usage if biopsies were taken.  SEEK IMMEDIATE MEDICAL CARE IF:   You have a fever.   You pass large blood clots or fill a toilet with blood following the procedure. This may also occur 10 to 14 days following the procedure. This is more likely if a biopsy was taken.   You develop abdominal pain that keeps getting worse and cannot be relieved with medicine.  Document Released: 08/20/2000 Document Revised: 05/05/2011 Document Reviewed: 04/04/2008 [facility] Patient Information 2012 [place], [place].  Your Colonoscopy is scheduled on 08/11/2011 at 10am We have sent your MoviPrep to your pharmacy today We will contact Dr Charlestine Massed about  holding your Coumadin prior to your procedure

## 2011-07-27 NOTE — Assessment & Plan Note (Signed)
Plan followup endoscopy in 2015

## 2011-07-27 NOTE — Assessment & Plan Note (Addendum)
Plan colonoscopy. Coumadin will be held in advance of the procedure if approved by cardiology.

## 2011-07-27 NOTE — Progress Notes (Signed)
History of Present Illness:  [name] is a 77 year old white female with history of atrial fibrillation, coronary artery disease, bladder and uterine cancer, on Coumadin, referred for followup colonoscopy. Family history is pertinent for her mother who had colon cancer at age 29.  Last colonoscopy in 2007 was negative. She complains of right lower quadrant discomfort. Bowels are very erratic alternating between watery stools and constipation. She denies rectal bleeding.    Review of Systems: Pertinent positive and negative review of systems were noted in the above HPI section. All other review of systems were otherwise negative.    Current Medications, Allergies, Past Medical History, Past Surgical History, Family History and Social History were reviewed in [employer] electronic medical record  Vital signs were reviewed in today's medical record. Physical Exam: General: Well developed , well nourished, no acute distress Head: Normocephalic and atraumatic Eyes:  sclerae anicteric, EOMI Ears: Normal auditory acuity Mouth: No deformity or lesions Lungs: Clear throughout to auscultation Heart: Regular rate and rhythm; no murmurs, rubs or bruits Abdomen: Soft,and non distended. No masses, hepatosplenomegaly or hernias noted. Normal Bowel sounds. There is minimal tenderness to palpation in the right lower quadrant Rectal:deferred Musculoskeletal: Symmetrical with no gross deformities  Pulses:  Normal pulses noted Extremities: No clubbing, cyanosis, edema or deformities noted Neurological: Alert oriented x 4, grossly nonfocal Psychological:  Alert and cooperative. Normal mood and affect

## 2011-07-27 NOTE — Assessment & Plan Note (Signed)
Currently asymptomatic 

## 2011-07-27 NOTE — Assessment & Plan Note (Addendum)
Patient has erratic bowels that she attributes to her radiation therapy. Nonspecific abdominal pain is also present.  Recommendations #1 fiber supplementation #2 hyomax when necessary

## 2011-08-09 ENCOUNTER — Telehealth: Payer: Self-pay | Admitting: Cardiology

## 2011-08-09 ENCOUNTER — Telehealth: Payer: Self-pay | Admitting: *Deleted

## 2011-08-09 DIAGNOSIS — I4891 Unspecified atrial fibrillation: Secondary | ICD-10-CM

## 2011-08-09 NOTE — Telephone Encounter (Signed)
Advised patient to hold until after procedure.  Spoke with Merri Ray and apologized  Dr. Patty Sermons never received later.  Advised Zella Ball and will forward note

## 2011-08-09 NOTE — Telephone Encounter (Signed)
Not sure if you needed this

## 2011-08-09 NOTE — Telephone Encounter (Signed)
Endoscopy scheduled for 12/5, no coumadin today.  Please advise

## 2011-08-09 NOTE — Telephone Encounter (Signed)
Hold Coumadin until day of procedure and check a pro time on the morning of the procedure

## 2011-08-09 NOTE — Telephone Encounter (Signed)
New problem:  pls advise on coumadin Procedure on 12/5.

## 2011-08-09 NOTE — Telephone Encounter (Signed)
Dr Arlyce Dice, Dr [place] office called today and stated that [name] could hold Coumadin today and tomorrow and Wednesday, The procedure is on Wednesday  She will need a STAT I&R before her procedure for you to review before her procedure.   Is this OK??

## 2011-08-09 NOTE — Telephone Encounter (Signed)
Sent anti-coag letter on 11/20. Routed to Dr Patty Sermons Have not heard anything and the pts procedure is Wednesday. May have to cancel pts procedure   Called pt to inform we may have to cancel procedure for Wednesday. I will contact her when I hear something today

## 2011-08-10 NOTE — Telephone Encounter (Signed)
ok 

## 2011-08-10 NOTE — Telephone Encounter (Signed)
SPOKE WITH PT, SHE WILL CONTINUE TO HOLD HER COUMADIN AND HAVE STAT INR Wednesday MORNING BEFORE HER PROCEDURE

## 2011-08-11 ENCOUNTER — Other Ambulatory Visit (INDEPENDENT_AMBULATORY_CARE_PROVIDER_SITE_OTHER): Payer: 59

## 2011-08-11 ENCOUNTER — Ambulatory Visit (AMBULATORY_SURGERY_CENTER): Payer: 59 | Admitting: Gastroenterology

## 2011-08-11 ENCOUNTER — Encounter: Payer: Self-pay | Admitting: Gastroenterology

## 2011-08-11 VITALS — BP 143/86 | HR 74 | Temp 97.0°F | Resp 24 | Ht 61.0 in | Wt 151.0 lb

## 2011-08-11 DIAGNOSIS — K5730 Diverticulosis of large intestine without perforation or abscess without bleeding: Secondary | ICD-10-CM

## 2011-08-11 DIAGNOSIS — I4891 Unspecified atrial fibrillation: Secondary | ICD-10-CM

## 2011-08-11 DIAGNOSIS — Z1211 Encounter for screening for malignant neoplasm of colon: Secondary | ICD-10-CM

## 2011-08-11 DIAGNOSIS — Z800 Family history of malignant neoplasm of digestive organs: Secondary | ICD-10-CM

## 2011-08-11 LAB — PROTIME-INR: Prothrombin Time: 17.6 s — ABNORMAL HIGH (ref 10.2–12.4)

## 2011-08-11 MED ORDER — SODIUM CHLORIDE 0.9 % IV SOLN: 500.0000 mL | INTRAVENOUS | Status: DC

## 2011-08-11 NOTE — Progress Notes (Signed)
Pt pt the last dose of coumadin was Sunday night.  Dr. Arlyce Dice made aware.  Maw  Sedation administered propofol by Charlett Lango , CRNA. Maw  Pt tolerated the colonoscopy very well. maw

## 2011-08-11 NOTE — Progress Notes (Signed)
Patient did not experience any of the following events: a burn prior to discharge; a fall within the facility; wrong site/side/patient/procedure/implant event; or a hospital transfer or hospital admission upon discharge from the facility. (G8907) Patient did not have preoperative order for IV antibiotic SSI prophylaxis. (G8918)  

## 2011-08-11 NOTE — Patient Instructions (Addendum)
Resume coumadin today.  SEE BLUE AND GREEN SHEETS FOR ADDITIONAL D/C INSTRUCTIONS.

## 2011-08-12 ENCOUNTER — Telehealth: Payer: Self-pay | Admitting: *Deleted

## 2011-08-12 NOTE — Telephone Encounter (Signed)
Left message

## 2011-08-13 ENCOUNTER — Ambulatory Visit (INDEPENDENT_AMBULATORY_CARE_PROVIDER_SITE_OTHER): Payer: Self-pay | Admitting: Cardiology

## 2011-08-13 ENCOUNTER — Encounter: Payer: 59 | Admitting: *Deleted

## 2011-08-13 ENCOUNTER — Telehealth: Payer: Self-pay | Admitting: Cardiology

## 2011-08-13 DIAGNOSIS — I4891 Unspecified atrial fibrillation: Secondary | ICD-10-CM

## 2011-08-13 DIAGNOSIS — R0989 Other specified symptoms and signs involving the circulatory and respiratory systems: Secondary | ICD-10-CM

## 2011-08-13 NOTE — Telephone Encounter (Signed)
Pt had colonoscopy on 12/5.  Has been off Coumadin for procedure.  Will need to reschedule pt's appt for 7-10 days after restart.  She has an appt in [place] on 12/20 and would like to wait until then to recheck.  Appt made at 11am on 12/20.

## 2011-08-13 NOTE — Telephone Encounter (Signed)
New message:  Pt had PT done on Weds before Colonoscopy.  Does she need to come today for her PT check?  If not, please call with a different appt.    After 12:00  [phone]

## 2011-08-26 ENCOUNTER — Ambulatory Visit (INDEPENDENT_AMBULATORY_CARE_PROVIDER_SITE_OTHER): Payer: 59 | Admitting: *Deleted

## 2011-08-26 DIAGNOSIS — I4891 Unspecified atrial fibrillation: Secondary | ICD-10-CM

## 2011-09-13 ENCOUNTER — Encounter: Payer: 59 | Admitting: *Deleted

## 2011-09-16 ENCOUNTER — Ambulatory Visit (INDEPENDENT_AMBULATORY_CARE_PROVIDER_SITE_OTHER): Payer: 59 | Admitting: *Deleted

## 2011-09-16 DIAGNOSIS — I4891 Unspecified atrial fibrillation: Secondary | ICD-10-CM

## 2011-09-23 ENCOUNTER — Encounter: Payer: Self-pay | Admitting: Internal Medicine

## 2011-09-23 ENCOUNTER — Ambulatory Visit (INDEPENDENT_AMBULATORY_CARE_PROVIDER_SITE_OTHER): Payer: 59 | Admitting: *Deleted

## 2011-09-23 DIAGNOSIS — I4891 Unspecified atrial fibrillation: Secondary | ICD-10-CM

## 2011-09-23 DIAGNOSIS — I495 Sick sinus syndrome: Secondary | ICD-10-CM

## 2011-09-26 LAB — REMOTE PACEMAKER DEVICE
AL IMPEDENCE PM: 493 Ohm
ATRIAL PACING PM: 96
BATTERY VOLTAGE: 2.81 V
VENTRICULAR PACING PM: 1

## 2011-10-04 NOTE — Progress Notes (Signed)
Remote pacer check  

## 2011-10-05 ENCOUNTER — Encounter: Payer: Self-pay | Admitting: *Deleted

## 2011-10-07 ENCOUNTER — Encounter: Payer: 59 | Admitting: *Deleted

## 2011-10-08 ENCOUNTER — Ambulatory Visit (INDEPENDENT_AMBULATORY_CARE_PROVIDER_SITE_OTHER): Payer: 59 | Admitting: Pharmacist

## 2011-10-08 DIAGNOSIS — I4891 Unspecified atrial fibrillation: Secondary | ICD-10-CM

## 2011-10-08 LAB — POCT INR: INR: 2.9

## 2011-10-21 ENCOUNTER — Ambulatory Visit (INDEPENDENT_AMBULATORY_CARE_PROVIDER_SITE_OTHER): Payer: 59 | Admitting: Cardiology

## 2011-10-21 ENCOUNTER — Encounter: Payer: Self-pay | Admitting: Cardiology

## 2011-10-21 VITALS — BP 130/80 | HR 80 | Ht 61.0 in | Wt 150.0 lb

## 2011-10-21 DIAGNOSIS — I350 Nonrheumatic aortic (valve) stenosis: Secondary | ICD-10-CM

## 2011-10-21 DIAGNOSIS — I4891 Unspecified atrial fibrillation: Secondary | ICD-10-CM

## 2011-10-21 DIAGNOSIS — I119 Hypertensive heart disease without heart failure: Secondary | ICD-10-CM

## 2011-10-21 DIAGNOSIS — I10 Essential (primary) hypertension: Secondary | ICD-10-CM

## 2011-10-21 DIAGNOSIS — I359 Nonrheumatic aortic valve disorder, unspecified: Secondary | ICD-10-CM

## 2011-10-21 NOTE — Progress Notes (Signed)
[name] Date of Birth:  Nov 28, 1933 [facility] [address] [place], [place]  [address] [phone]         Fax   [phone]  History of Present Illness: This pleasant 78 year old woman is seen for a scheduled followup office visit.  She has a past history of paroxysmal atrial fibrillation.  She has recently been maintaining normal sinus rhythm.  She has a history of tachybradycardia syndrome status post Medtronic dual chamber pacemaker in 2011.  She had cardiac catheterization in 2008 which showed nonobstructive coronary disease.  She has a history of congenital heart disease and at age 56 had repair of a patent ductus arteriosus down in [place].  She has a history of moderate aortic stenosis and mild to moderate aortic insufficiency by echocardiogram.  Current Outpatient Prescriptions  Medication Sig Dispense Refill  . amiodarone (PACERONE) 200 MG tablet Take 200 mg by mouth. taking one half tab daily      . Ascorbic Acid (VITAMIN C PO) Take by mouth daily.       [name] atenolol (TENORMIN) 100 MG tablet 1 tab po bid       . atorvastatin (LIPITOR) 40 MG tablet Take 20 mg by mouth. Takes only Monday-Friday      . Calcium Carbonate-Vitamin D (CALCIUM + D PO) Take by mouth daily.        . Cholecalciferol (VITAMIN D-3) 5000 UNITS TABS 1 tab po qd       . Cyanocobalamin (B-12 PO) Take by mouth daily.       . Famotidine (PEPCID PO) Take by mouth daily.        . furosemide (LASIX) 40 MG tablet Take by mouth. 1/2 tab 3 x a week      . Hyoscyamine Sulfate (HYOMAX-DT) 0.375 MG TBCR Take one tab twice a day as needed for abdominal pain  25 each  1  . levothyroxine (SYNTHROID, LEVOTHROID) 100 MCG tablet 1/2 tab po qd      . losartan-hydrochlorothiazide (HYZAAR) 100-25 MG per tablet Take 1 tablet by mouth daily.       [name] MAGNESIUM PO Take by mouth 3 (three) times a week.        . nitroGLYCERIN (NITRODUR - DOSED IN MG/24 HR) 0.4 mg/hr Place 1 patch onto the  skin as needed.       . nitroGLYCERIN (NITROSTAT) 0.4 MG SL tablet Place 0.4 mg under the tongue every 5 (five) minutes as needed.        . Omega-3 Fatty Acids (FISH OIL PO) Take by mouth daily.       . potassium chloride SA (K-DUR,KLOR-CON) 20 MEQ tablet Take 1 tablet (20 mEq total) by mouth 3 (three) times daily.  270 tablet  3  . pregabalin (LYRICA) 75 MG capsule Take 75 mg by mouth 2 (two) times daily.       . sertraline (ZOLOFT) 100 MG tablet Take 100 mg by mouth daily.        [name] warfarin (COUMADIN) 2 MG tablet Take 2 mg by mouth as directed.         Allergies  Allergen Reactions  . Codeine   . Zinc     nausea    Patient Active Problem List  Diagnoses  . RENAL CELL CANCER  . BENIGN NEOPLASM OTH&UNSPEC SITE DIGESTIVE SYSTEM  . HYPOTHYROIDISM  . HYPERLIPIDEMIA  . ANXIETY  . DEPRESSION  . RESTLESS LEG SYNDROME  . HYPERTENSION  . GASTROPARESIS  .  RADIATION PROCTITIS  . FIBROCYSTIC BREAST DISEASE  . OSTEOMYELITIS  . CARCINOMA, UTERUS, HX OF  . RADIATION THERAPY, HX OF  . Atrial fibrillation  . Tachycardia-bradycardia syndrome  . S/P repair of patent ductus arteriosus  . Osteoarthritis  . Aortic stenosis, moderate  . Family history of malignant neoplasm of gastrointestinal tract  . Benign neoplasm of stomach  . Diarrhea    History  Smoking status  . Former Smoker  Smokeless tobacco  . Never Used    History  Alcohol Use No    Family History  Problem Relation Age of Onset  . Colon cancer Mother 65  . Heart disease Mother   . Heart disease Father   . Heart attack Father   . Heart disease Maternal Grandmother     Review of Systems: Constitutional: no fever chills diaphoresis or fatigue or change in weight.  Head and neck: no hearing loss, no epistaxis, no photophobia or visual disturbance. Respiratory: No cough, shortness of breath or wheezing. Cardiovascular: No chest pain peripheral edema, palpitations. Gastrointestinal: No abdominal distention, no  abdominal pain, no change in bowel habits hematochezia or melena. Genitourinary: No dysuria, no frequency, no urgency, no nocturia. Musculoskeletal:No arthralgias, no back pain, no gait disturbance or myalgias. Neurological: No dizziness, no headaches, no numbness, no seizures, no syncope, no weakness, no tremors. Hematologic: No lymphadenopathy, no easy bruising. Psychiatric: No confusion, no hallucinations, no sleep disturbance.    Physical Exam: Filed Vitals:   10/21/11 1509  BP: 130/80  Pulse: 80   the general appearance reveals a well-developed well-nourished woman in no distress.Pupils equal and reactive.   Extraocular Movements are full.  There is no scleral icterus.  The mouth and pharynx are normal.  The neck is supple.  The carotids reveal no bruits.  The jugular venous pressure is normal.  The thyroid is not enlarged.  There is no lymphadenopathy.  The chest is clear to percussion and auscultation. There are no rales or rhonchi. Expansion of the chest is symmetrical.  Heart reveals a soft systolic ejection murmur grade 2/6 at the base.The abdomen is soft and nontender. Bowel sounds are normal. The liver and spleen are not enlarged. There Are no abdominal masses. There are no bruits.  The pedal pulses are good.  There is no phlebitis or edema.  There is no cyanosis or clubbing. Strength is normal and symmetrical in all extremities.  There is no lateralizing weakness.  There are no sensory deficits.  The skin is warm and dry.  There is no rash.    Assessment / Plan: Continue same medication.  Recheck in 3 months for followup office visit and EKG.

## 2011-10-21 NOTE — Assessment & Plan Note (Signed)
The patient has a past history of high blood pressure.  Her blood pressure has been maintained in normal range on current therapy.  She's having no dizzy spells.

## 2011-10-21 NOTE — Assessment & Plan Note (Signed)
The patient has been maintaining normal sinus rhythm.  She is on low-dose amiodarone 100 mg daily.

## 2011-10-21 NOTE — Assessment & Plan Note (Signed)
The patient has not been having any additional symptoms referable to her aortic stenosis.  Her last echocardiogram on 12/15/09 showed only moderate aortic stenosis.  She does have exertional dyspnea climbing steps.  She's not having any recent exertional chest pain and no exertional dizziness or syncope.

## 2011-10-21 NOTE — Patient Instructions (Signed)
Your physician recommends that you continue on your current medications as directed. Please refer to the Current Medication list given to you today. Your physician recommends that you schedule a follow-up appointment in: 3 months office visit and EKG  

## 2011-11-04 ENCOUNTER — Ambulatory Visit (INDEPENDENT_AMBULATORY_CARE_PROVIDER_SITE_OTHER): Payer: 59

## 2011-11-04 DIAGNOSIS — I4891 Unspecified atrial fibrillation: Secondary | ICD-10-CM

## 2011-11-04 LAB — POCT INR: INR: 1.9

## 2011-12-02 ENCOUNTER — Ambulatory Visit (INDEPENDENT_AMBULATORY_CARE_PROVIDER_SITE_OTHER): Payer: 59

## 2011-12-02 DIAGNOSIS — I4891 Unspecified atrial fibrillation: Secondary | ICD-10-CM

## 2011-12-17 ENCOUNTER — Encounter: Payer: Self-pay | Admitting: Cardiology

## 2011-12-22 ENCOUNTER — Ambulatory Visit (INDEPENDENT_AMBULATORY_CARE_PROVIDER_SITE_OTHER): Payer: 59 | Admitting: Internal Medicine

## 2011-12-22 ENCOUNTER — Ambulatory Visit (INDEPENDENT_AMBULATORY_CARE_PROVIDER_SITE_OTHER): Payer: 59 | Admitting: *Deleted

## 2011-12-22 ENCOUNTER — Encounter: Payer: Self-pay | Admitting: Internal Medicine

## 2011-12-22 VITALS — BP 132/64 | HR 70 | Resp 18 | Ht 61.0 in | Wt 150.8 lb

## 2011-12-22 DIAGNOSIS — I4891 Unspecified atrial fibrillation: Secondary | ICD-10-CM

## 2011-12-22 DIAGNOSIS — I495 Sick sinus syndrome: Secondary | ICD-10-CM

## 2011-12-22 LAB — PACEMAKER DEVICE OBSERVATION
AL IMPEDENCE PM: 491 Ohm
AL THRESHOLD: 0.375 V
BATTERY VOLTAGE: 2.81 V
RV LEAD AMPLITUDE: 11.2 mv
RV LEAD IMPEDENCE PM: 449 Ohm

## 2011-12-22 LAB — POCT INR: INR: 3

## 2011-12-22 NOTE — Progress Notes (Signed)
Primary Cardiologist:  Dr Patty Sermons  [name] has a h/o atrial fibrillation and tachycardia/ bradycardia syndrome sp PPM (MDT) by Dr Deborah Chalk.  She presents today for follow-up in the Electrophysiology device clinic.  She presented with tachycardia/ bradycardia syndrome and underwent PPM by Dr Deborah Chalk 07/03/10.  The patient reports doing very well since having a pacemaker implanted and remains very active despite her age.  She continues to work for old [employer].  Today, she  denies symptoms of palpitations, chest pain, shortness of breath, orthopnea, PND, lower extremity edema, dizziness, presyncope, syncope, or neurologic sequela.  The patientis tolerating medications without difficulties and is otherwise without complaint today.   Past Medical History  Diagnosis Date  . PAF (paroxysmal atrial fibrillation)     controlled with amiodarone, on coumadin  . CAD (coronary artery disease)   . Aortic root dilatation   . Transitional cell carcinoma   . History of uterine cancer   . HTN (hypertension)   . Hyperlipemia   . Hypothyroidism   . Moderate aortic stenosis   . Chronic anticoagulation   . Tachycardia-bradycardia syndrome     s/p PPM by Dr Deborah Chalk (MDT) 07/03/10  . GERD (gastroesophageal reflux disease)   . Chronic kidney disease     Past Surgical History  Procedure Date  . Cardiac catheterization 2008  . Nephrectomy   . Total abdominal hysterectomy   . Patent ductus arterious repair age 55  . Ptvp   . Pacemaker insertion 07/04/11    by Dr Deborah Chalk  . Cholecystectomy   . Upper gastrointestinal endoscopy     History   Social History  . Marital Status: Married    Spouse Name: N/A    Number of Children: N/A  . Years of Education: N/A   Occupational History  . Not on file.   Social History Main Topics  . Smoking status: Former Games developer  . Smokeless tobacco: Never Used  . Alcohol Use: No  . Drug Use: No  . Sexually Active: Not on file   Other Topics Concern    . Not on file   Social History Narrative   Lives in [place].  Continues to work.    Family History  Problem Relation Age of Onset  . Colon cancer Mother 26  . Heart disease Mother   . Heart disease Father   . Heart attack Father   . Heart disease Maternal Grandmother     Allergies  Allergen Reactions  . Codeine   . Zinc     nausea    Current outpatient prescriptions:amiodarone (PACERONE) 200 MG tablet, Take 200 mg by mouth. taking one half tab daily, Disp: , Rfl: ;  Ascorbic Acid (VITAMIN C PO), Take by mouth daily. , Disp: , Rfl: ;  atenolol (TENORMIN) 100 MG tablet, 1 tab po bid , Disp: , Rfl: ;  atorvastatin (LIPITOR) 40 MG tablet, Take 20 mg by mouth. Takes only Monday-Friday, Disp: , Rfl: ;  Calcium Carbonate-Vitamin D (CALCIUM + D PO), Take by mouth daily.  , Disp: , Rfl:  Cholecalciferol (VITAMIN D-3) 5000 UNITS TABS, 1 tab po qd , Disp: , Rfl: ;  Cyanocobalamin (B-12 PO), Take by mouth daily. , Disp: , Rfl: ;  Famotidine (PEPCID PO), Take by mouth daily.  , Disp: , Rfl: ;  furosemide (LASIX) 40 MG tablet, Take by mouth. 1/2 tab 3 x a week, Disp: , Rfl: ;  Hyoscyamine Sulfate (HYOMAX-DT) 0.375 MG TBCR, Take one tab twice a day  as needed for abdominal pain, Disp: 25 each, Rfl: 1 levothyroxine (SYNTHROID, LEVOTHROID) 100 MCG tablet, 1/2 tab po qd, Disp: , Rfl: ;  losartan-hydrochlorothiazide (HYZAAR) 100-25 MG per tablet, Take 1 tablet by mouth daily. , Disp: , Rfl: ;  MAGNESIUM PO, Take by mouth 3 (three) times a week.  , Disp: , Rfl: ;  nitroGLYCERIN (NITRODUR - DOSED IN MG/24 HR) 0.4 mg/hr, Place 1 patch onto the skin as needed. , Disp: , Rfl: ;  Omega-3 Fatty Acids (FISH OIL PO), Take by mouth daily. , Disp: , Rfl:  potassium chloride SA (K-DUR,KLOR-CON) 20 MEQ tablet, Take 1 tablet (20 mEq total) by mouth 3 (three) times daily., Disp: 270 tablet, Rfl: 3;  pregabalin (LYRICA) 75 MG capsule, Take 75 mg by mouth 2 (two) times daily. , Disp: , Rfl: ;  sertraline (ZOLOFT) 100  MG tablet, Take 100 mg by mouth daily.  , Disp: , Rfl: ;  warfarin (COUMADIN) 2 MG tablet, Take 2 mg by mouth as directed. , Disp: , Rfl:  nitroGLYCERIN (NITROSTAT) 0.4 MG SL tablet, Place 0.4 mg under the tongue every 5 (five) minutes as needed.  , Disp: , Rfl:   Physical Exam: Filed Vitals:   12/22/11 0959  BP: 132/64  Pulse: 70  Resp: 18  Height: 5\' 1"  (1.549 m)  Weight: 150 lb 12.8 oz (68.402 kg)    GEN- The patient is well appearing, alert and oriented x 3 today.   Head- normocephalic, atraumatic Eyes-  Sclera clear, conjunctiva pink Ears- hearing intact Oropharynx- clear Neck- supple, no JVP Lymph- no cervical lymphadenopathy Lungs- Clear to ausculation bilaterally, normal work of breathing Chest- R sided pacemaker pocket is well healed Heart- Regular rate and rhythm, no murmurs, rubs or gallops, PMI not laterally displaced GI- soft, NT, ND, + BS Extremities- no clubbing, cyanosis, or edema

## 2011-12-22 NOTE — Assessment & Plan Note (Signed)
Stable No change required today Continue coumadin longterm  

## 2011-12-22 NOTE — Patient Instructions (Signed)
Your physician recommends that you continue on your current medications as directed. Please refer to the Current Medication list given to you today.  Your physician wants you to follow-up in: 1 year with Dr. Johney Frame. You will receive a reminder letter in the mail two months in advance. If you don't receive a letter, please call our office to schedule the follow-up appointment.  Every 3 months you will need to do a Care[facility] check.

## 2011-12-22 NOTE — Assessment & Plan Note (Signed)
Normal pacemaker function See Pace Art report No changes today  

## 2012-01-12 ENCOUNTER — Ambulatory Visit (INDEPENDENT_AMBULATORY_CARE_PROVIDER_SITE_OTHER): Payer: 59 | Admitting: *Deleted

## 2012-01-12 DIAGNOSIS — I4891 Unspecified atrial fibrillation: Secondary | ICD-10-CM

## 2012-01-12 LAB — POCT INR: INR: 3.6

## 2012-01-17 ENCOUNTER — Encounter: Payer: Self-pay | Admitting: Cardiology

## 2012-01-17 ENCOUNTER — Ambulatory Visit (INDEPENDENT_AMBULATORY_CARE_PROVIDER_SITE_OTHER): Payer: 59 | Admitting: Cardiology

## 2012-01-17 VITALS — BP 140/70 | Ht 61.0 in | Wt 148.0 lb

## 2012-01-17 DIAGNOSIS — G2581 Restless legs syndrome: Secondary | ICD-10-CM

## 2012-01-17 DIAGNOSIS — I359 Nonrheumatic aortic valve disorder, unspecified: Secondary | ICD-10-CM

## 2012-01-17 DIAGNOSIS — I4891 Unspecified atrial fibrillation: Secondary | ICD-10-CM

## 2012-01-17 DIAGNOSIS — I350 Nonrheumatic aortic (valve) stenosis: Secondary | ICD-10-CM

## 2012-01-17 DIAGNOSIS — I10 Essential (primary) hypertension: Secondary | ICD-10-CM

## 2012-01-17 DIAGNOSIS — I119 Hypertensive heart disease without heart failure: Secondary | ICD-10-CM

## 2012-01-17 NOTE — Progress Notes (Signed)
[name] Date of Birth:  Jan 02, 1934 [facility] [address] [place], [place]  [address] [phone]         Fax   [phone]  History of Present Illness: This pleasant 78 year old woman is seen for a scheduled followup office visit.  She has a history of paroxysmal atrial fibrillation and tachybradycardia syndrome.  She has a permanent pacemaker by [employer] by Dr. Deborah Chalk.  The pacemaker was implanted 07/03/10.  She is now followed by Dr. Johney Frame.  The patient has a history of congenital heart disease and had HTN and underwent repair of a patent ductus arteriosus in [place].  She has a history of moderate aortic stenosis and mild to moderate aortic insufficiency by echo.  She had cardiac catheterization in 2008 which showed nonobstructive coronary artery disease.  Since last visit she's been doing well she's had no chest pain.  Blood pressure has been good at home and she has remained in normal sinus rhythm.  She has been having a lot of problems with arthritis of the right knee and is anticipating needing to have a right total knee replacement soon.  Current Outpatient Prescriptions  Medication Sig Dispense Refill  . amiodarone (PACERONE) 200 MG tablet Take 200 mg by mouth daily.       . Ascorbic Acid (VITAMIN C PO) Take by mouth daily.       [name] atenolol (TENORMIN) 100 MG tablet 1 tab po bid       . atorvastatin (LIPITOR) 40 MG tablet Take 20 mg by mouth. Takes only Monday-Friday      . Calcium Carbonate-Vitamin D (CALCIUM + D PO) Take by mouth daily.        . Cholecalciferol (VITAMIN D-3) 5000 UNITS TABS 1 tab po qd       . Cyanocobalamin (B-12 PO) Take by mouth daily.       . Famotidine (PEPCID PO) Take by mouth daily.        . furosemide (LASIX) 40 MG tablet Take by mouth. 1/2 tab 3 x a week      . Hyoscyamine Sulfate (HYOMAX-DT) 0.375 MG TBCR Take one tab twice a day as needed for abdominal pain  25 each  1  . levothyroxine (SYNTHROID,  LEVOTHROID) 100 MCG tablet 1/2 tab po qd      . losartan-hydrochlorothiazide (HYZAAR) 100-25 MG per tablet Take 1 tablet by mouth daily.       [name] MAGNESIUM PO Take by mouth 3 (three) times a week.        . nitroGLYCERIN (NITRODUR - DOSED IN MG/24 HR) 0.4 mg/hr Place 1 patch onto the skin as needed.       . nitroGLYCERIN (NITROSTAT) 0.4 MG SL tablet Place 0.4 mg under the tongue every 5 (five) minutes as needed.        . Omega-3 Fatty Acids (FISH OIL PO) Take by mouth daily.       . potassium chloride SA (K-DUR,KLOR-CON) 20 MEQ tablet Take 1 tablet (20 mEq total) by mouth 3 (three) times daily.  270 tablet  3  . pregabalin (LYRICA) 75 MG capsule Take 75 mg by mouth 2 (two) times daily.       . sertraline (ZOLOFT) 100 MG tablet Take 100 mg by mouth daily.        [name] warfarin (COUMADIN) 2 MG tablet Take 2 mg by mouth as directed.         Allergies  Allergen Reactions  .  Codeine   . Zinc     nausea    Patient Active Problem List  Diagnoses  . RENAL CELL CANCER  . BENIGN NEOPLASM OTH&UNSPEC SITE DIGESTIVE SYSTEM  . HYPOTHYROIDISM  . HYPERLIPIDEMIA  . ANXIETY  . DEPRESSION  . RESTLESS LEG SYNDROME  . HYPERTENSION  . GASTROPARESIS  . RADIATION PROCTITIS  . FIBROCYSTIC BREAST DISEASE  . OSTEOMYELITIS  . CARCINOMA, UTERUS, HX OF  . RADIATION THERAPY, HX OF  . Atrial fibrillation  . Tachycardia-bradycardia syndrome  . S/P repair of patent ductus arteriosus  . Osteoarthritis  . Aortic stenosis, moderate  . Family history of malignant neoplasm of gastrointestinal tract  . Benign neoplasm of stomach  . Diarrhea    History  Smoking status  . Former Smoker  Smokeless tobacco  . Never Used    History  Alcohol Use No    Family History  Problem Relation Age of Onset  . Colon cancer Mother 61  . Heart disease Mother   . Heart disease Father   . Heart attack Father   . Heart disease Maternal Grandmother     Review of Systems: Constitutional: no fever chills diaphoresis or  fatigue or change in weight.  Head and neck: no hearing loss, no epistaxis, no photophobia or visual disturbance. Respiratory: No cough, shortness of breath or wheezing. Cardiovascular: No chest pain peripheral edema, palpitations. Gastrointestinal: No abdominal distention, no abdominal pain, no change in bowel habits hematochezia or melena. Genitourinary: No dysuria, no frequency, no urgency, no nocturia. Musculoskeletal:No arthralgias, no back pain, no gait disturbance or myalgias. Neurological: No dizziness, no headaches, no numbness, no seizures, no syncope, no weakness, no tremors. Hematologic: No lymphadenopathy, no easy bruising. Psychiatric: No confusion, no hallucinations, no sleep disturbance.    Physical Exam: Filed Vitals:   01/17/12 1341  BP: 140/70   the general appearance reveals a well-developed well-nourished woman in no distress.Pupils equal and reactive.   Extraocular Movements are full.  There is no scleral icterus.  The mouth and pharynx are normal.  The neck is supple.  The carotids reveal no bruits.  The jugular venous pressure is normal.  The thyroid is not enlarged.  There is no lymphadenopathy.  The chest is clear to percussion and auscultation. There are no rales or rhonchi. Expansion of the chest is symmetrical.  The heart reveals a grade 3/6 harsh systolic ejection murmur of aortic stenosis.  No diastolic murmur.  No gallop or rub The abdomen is soft and nontender. Bowel sounds are normal. The liver and spleen are not enlarged. There Are no abdominal masses. There are no bruits.  The pedal pulses are good.  There is no phlebitis or edema.  There is no cyanosis or clubbing. Strength is normal and symmetrical in all extremities.  There is no lateralizing weakness.  There are no sensory deficits.  The skin is warm and dry.  There is no rash.  EKG shows atrial paced rhythm.  There are nonspecific ST-T wave changes present.   Assessment / Plan: Continue same  medication and be rechecked in 3 months for followup office visit

## 2012-01-17 NOTE — Patient Instructions (Signed)
Your physician recommends that you continue on your current medications as directed. Please refer to the Current Medication list given to you today.  Your physician recommends that you schedule a follow-up appointment in: 3 months  

## 2012-01-17 NOTE — Assessment & Plan Note (Signed)
The patient has had a good response to Lyrica  for her neuropathy and her restless leg syndrome

## 2012-01-17 NOTE — Assessment & Plan Note (Signed)
The patient has not been any symptoms of congestive heart failure.  She's not having any dizziness or syncope.  No headaches.

## 2012-01-17 NOTE — Assessment & Plan Note (Signed)
The patient has known moderate aortic stenosis.  Her last echo was 12/15/09 showing a peak aortic valve gradient of 32 and a mean gradient of 15.  She also had mild mitral regurgitation and mild tricuspid regurgitation and she had normal left ventricular systolic function with an ejection fraction of 55-60%.  He is not currently having any symptoms referable to her aortic stenosis

## 2012-01-17 NOTE — Assessment & Plan Note (Signed)
EKG today shows that she is in an atrial paced rhythm.  She is on long-term Coumadin for her paroxysmal atrial fib

## 2012-01-27 ENCOUNTER — Encounter (HOSPITAL_COMMUNITY): Payer: Self-pay | Admitting: Pharmacy Technician

## 2012-02-01 ENCOUNTER — Other Ambulatory Visit (HOSPITAL_COMMUNITY): Payer: Self-pay | Admitting: Cardiology

## 2012-02-01 ENCOUNTER — Ambulatory Visit (HOSPITAL_COMMUNITY): Payer: 59 | Attending: Cardiology

## 2012-02-01 ENCOUNTER — Other Ambulatory Visit: Payer: Self-pay

## 2012-02-01 DIAGNOSIS — I519 Heart disease, unspecified: Secondary | ICD-10-CM | POA: Insufficient documentation

## 2012-02-01 DIAGNOSIS — I079 Rheumatic tricuspid valve disease, unspecified: Secondary | ICD-10-CM | POA: Insufficient documentation

## 2012-02-01 DIAGNOSIS — I4891 Unspecified atrial fibrillation: Secondary | ICD-10-CM | POA: Insufficient documentation

## 2012-02-01 DIAGNOSIS — C649 Malignant neoplasm of unspecified kidney, except renal pelvis: Secondary | ICD-10-CM | POA: Insufficient documentation

## 2012-02-01 DIAGNOSIS — I359 Nonrheumatic aortic valve disorder, unspecified: Secondary | ICD-10-CM | POA: Insufficient documentation

## 2012-02-01 DIAGNOSIS — Z87891 Personal history of nicotine dependence: Secondary | ICD-10-CM | POA: Insufficient documentation

## 2012-02-01 DIAGNOSIS — I495 Sick sinus syndrome: Secondary | ICD-10-CM | POA: Insufficient documentation

## 2012-02-01 DIAGNOSIS — I059 Rheumatic mitral valve disease, unspecified: Secondary | ICD-10-CM | POA: Insufficient documentation

## 2012-02-01 DIAGNOSIS — I10 Essential (primary) hypertension: Secondary | ICD-10-CM | POA: Insufficient documentation

## 2012-02-01 DIAGNOSIS — Z96659 Presence of unspecified artificial knee joint: Secondary | ICD-10-CM | POA: Insufficient documentation

## 2012-02-02 ENCOUNTER — Encounter (HOSPITAL_COMMUNITY): Payer: Self-pay

## 2012-02-02 ENCOUNTER — Encounter (HOSPITAL_COMMUNITY)
Admission: RE | Admit: 2012-02-02 | Discharge: 2012-02-02 | Disposition: A | Discharge status: Home / Self Care | Payer: 59 | Source: Ambulatory Visit | Attending: Orthopedic Surgery | Admitting: Orthopedic Surgery

## 2012-02-02 HISTORY — DX: Other specified postprocedural states: Z98.890

## 2012-02-02 HISTORY — DX: Nausea with vomiting, unspecified: R11.2

## 2012-02-02 HISTORY — DX: Unspecified osteoarthritis, unspecified site: M19.90

## 2012-02-02 HISTORY — DX: Restless legs syndrome: G25.81

## 2012-02-02 HISTORY — DX: Polyneuropathy, unspecified: G62.9

## 2012-02-02 LAB — PROTIME-INR
INR: 2.18 — ABNORMAL HIGH (ref 0.00–1.49)
Prothrombin Time: 24.6 seconds — ABNORMAL HIGH (ref 11.6–15.2)

## 2012-02-02 LAB — URINALYSIS, ROUTINE W REFLEX MICROSCOPIC
Bilirubin Urine: NEGATIVE
Nitrite: NEGATIVE
Specific Gravity, Urine: 1.017 (ref 1.005–1.030)
Urobilinogen, UA: 0.2 mg/dL (ref 0.0–1.0)

## 2012-02-02 LAB — BASIC METABOLIC PANEL
Calcium: 9.3 mg/dL (ref 8.4–10.5)
GFR calc Af Amer: 43 mL/min — ABNORMAL LOW (ref >90)
GFR calc non Af Amer: 37 mL/min — ABNORMAL LOW (ref >90)
Sodium: 145 mEq/L (ref 135–145)

## 2012-02-02 LAB — DIFFERENTIAL
Eosinophils Absolute: 0.2 10*3/uL (ref 0.0–0.7)
Lymphocytes Relative: 25 % (ref 12–46)
Lymphs Abs: 1.7 10*3/uL (ref 0.7–4.0)
Neutro Abs: 4.4 10*3/uL (ref 1.7–7.7)
Neutrophils Relative %: 64 % (ref 43–77)

## 2012-02-02 LAB — CBC
Platelets: 204 10*3/uL (ref 150–400)
RBC: 4.8 MIL/uL (ref 3.87–5.11)
WBC: 6.9 10*3/uL (ref 4.0–10.5)

## 2012-02-02 LAB — URINE MICROSCOPIC-ADD ON

## 2012-02-02 LAB — SURGICAL PCR SCREEN: MRSA, PCR: NEGATIVE

## 2012-02-02 LAB — APTT: aPTT: 44 seconds — ABNORMAL HIGH (ref 24–37)

## 2012-02-02 MED ORDER — CHLORHEXIDINE GLUCONATE 4 % EX LIQD
60.0000 mL | Freq: Once | CUTANEOUS | Status: DC
  Filled 2012-02-02: qty 60

## 2012-02-02 NOTE — H&P (Signed)
[name] is an 78 y.o. female.    Chief Complaint: right knee OA and pain   HPI: Pt is a 78 y.o. female complaining of right knee pain for 2+ years. Pain had continually increased since the beginning. X-rays in the clinic show end-stage arthritic changes of the right knee. Pt has tried various conservative treatments which have failed to alleviate their symptoms, including knee brace and steroid injections. Various options are discussed with the patient. Risks, benefits and expectations were discussed with the patient. Patient understand the risks, benefits and expectations and wishes to proceed with surgery.   PCP:  Nadean Corwin, MD, MD  D/C Plans:  Home with HHPT  Post-op Meds:   Rx given for ASA, Robaxin, Iron, Colace and MiraLax  Tranexamic Acid:   To be given  Decadron:   To be given  PMH: Past Medical History  Diagnosis Date  . PAF (paroxysmal atrial fibrillation) - controlled with amiodarone, on coumadin   . Aortic root dilatation   . History of uterine cancer   . HTN (hypertension)   . Hyperlipemia   . Hypothyroidism   . Moderate aortic stenosis   . Chronic anticoagulation   . Tachycardia-bradycardia syndrome   . GERD (gastroesophageal reflux disease)   . Chronic kidney disease   . PONV (postoperative nausea and vomiting)   . Pacemaker   . Arthritis   . History of nephrectomy - left - due to kidney cancer   . Neuropathy - feet/legs   . Restless leg syndrome   . Headache - hx migraines - takes Zoloft for migraines   . Cancer - hx Kidney cancer / hx of endometrial cancer     PSH: Past Surgical History  Procedure Date  . Cardiac catheterization   . Nephrectomy   . Total abdominal hysterectomy   . Patent ductus arterious repair   . Ptvp   . Pacemaker insertion   . Cholecystectomy   . Upper gastrointestinal endoscopy   . Joint replacement - rt total hip 2008     Social History:  reports that she has never smoked. She has never used smokeless  tobacco. She reports that she does not drink alcohol or use illicit drugs.  Allergies:  Allergies  Allergen Reactions  . Codeine Nausea And Vomiting  . Zinc Nausea    Medications: No current facility-administered medications for this encounter.   Current Outpatient Prescriptions  Medication Sig Dispense Refill  . amiodarone (PACERONE) 200 MG tablet Take 200 mg by mouth daily.       . Ascorbic Acid (VITAMIN C PO) Take 500 mg by mouth daily.       [name] atenolol (TENORMIN) 100 MG tablet Take 100 mg by mouth 2 (two) times daily. 1 tab po bid      . atorvastatin (LIPITOR) 40 MG tablet Take 20 mg by mouth See admin instructions. Takes only Monday-Friday      . calcium-vitamin D (OSCAL WITH D) 500-200 MG-UNIT per tablet Take 1 tablet by mouth daily.      . Cholecalciferol (VITAMIN D-3) 5000 UNITS TABS 1 tab po qd       . Cyanocobalamin (B-12 PO) Take 1,000 mg by mouth daily.       . Famotidine (PEPCID PO) Take by mouth daily.       . furosemide (LASIX) 40 MG tablet Take 20 mg by mouth See admin instructions. 1/2 tab 3 x a week      . Hyoscyamine Sulfate (HYOMAX-DT) 0.375 MG  TBCR Take one tab twice a day as needed for abdominal pain  25 each  1  . levothyroxine (SYNTHROID, LEVOTHROID) 100 MCG tablet 1/2 tab po qd      . losartan-hydrochlorothiazide (HYZAAR) 100-25 MG per tablet Take 1 tablet by mouth daily.       [name] MAGNESIUM PO Take 30 mg by mouth 3 (three) times a week.       . nitroGLYCERIN (NITRODUR - DOSED IN MG/24 HR) 0.4 mg/hr Place 1 patch onto the skin as needed. CHEST PAINS      . nitroGLYCERIN (NITROSTAT) 0.4 MG SL tablet Place 0.4 mg under the tongue every 5 (five) minutes as needed. CHEST PAINS      . omega-3 acid ethyl esters (LOVAZA) 1 G capsule Take 1 g by mouth daily.      . potassium chloride SA (K-DUR,KLOR-CON) 20 MEQ tablet Take 1 tablet (20 mEq total) by mouth 3 (three) times daily.  270 tablet  3  . pregabalin (LYRICA) 75 MG capsule Take 75 mg by mouth 2 (two) times daily.        . sertraline (ZOLOFT) 100 MG tablet Take 100 mg by mouth daily.       [name] warfarin (COUMADIN) 2 MG tablet Take 1-2 mg by mouth See admin instructions.        Facility-Administered Medications Ordered in Other Encounters  Medication Dose Route Frequency Provider Last Rate Last Dose  . chlorhexidine (HIBICLENS) 4 % liquid 4 application  60 mL Topical Once Genelle Gather [place], [place]        Results for orders placed during the hospital encounter of 02/02/12 (from the past 48 hour(s))  URINALYSIS, ROUTINE W REFLEX MICROSCOPIC     Status: Abnormal   Collection Time   02/02/12  9:51 AM      Component Value Range Comment   Color, Urine YELLOW  YELLOW     APPearance CLEAR  CLEAR     Specific Gravity, Urine 1.017  1.005 - 1.030     pH 5.0  5.0 - 8.0     Glucose, UA NEGATIVE  NEGATIVE (mg/dL)    Hgb urine dipstick NEGATIVE  NEGATIVE     Bilirubin Urine NEGATIVE  NEGATIVE     Ketones, ur NEGATIVE  NEGATIVE (mg/dL)    Protein, ur NEGATIVE  NEGATIVE (mg/dL)    Urobilinogen, UA 0.2  0.0 - 1.0 (mg/dL)    Nitrite NEGATIVE  NEGATIVE     Leukocytes, UA TRACE (*) NEGATIVE    URINE MICROSCOPIC-ADD ON     Status: Abnormal   Collection Time   02/02/12  9:51 AM      Component Value Range Comment   Squamous Epithelial / LPF FEW (*) RARE     WBC, UA 3-6  <3 (WBC/hpf)    Bacteria, UA FEW (*) RARE    NO BLOOD PRODUCTS     Status: Normal   Collection Time   02/02/12 10:28 AM      Component Value Range Comment   Transfuse no blood products        Value: TRANSFUSE NO BLOOD PRODUCTS, VERIFIED BY SHANNON SHOFNER RN, D. REED RN  APTT     Status: Abnormal   Collection Time   02/02/12 11:00 AM      Component Value Range Comment   aPTT 44 (*) 24 - 37 (seconds)   BASIC METABOLIC PANEL     Status: Abnormal   Collection Time   02/02/12 11:00 AM  Component Value Range Comment   Sodium 145  135 - 145 (mEq/L)    Potassium 4.0  3.5 - 5.1 (mEq/L)    Chloride 106  96 - 112 (mEq/L)    CO2 27  19 - 32 (mEq/L)     Glucose, Bld 89  70 - 99 (mg/dL)    BUN 28 (*) 6 - 23 (mg/dL)    Creatinine, Ser 4.09 (*) 0.50 - 1.10 (mg/dL)    Calcium 9.3  8.4 - 10.5 (mg/dL)    GFR calc non Af Amer 37 (*) >90 (mL/min)    GFR calc Af Amer 43 (*) >90 (mL/min)   CBC     Status: Normal   Collection Time   02/02/12 11:00 AM      Component Value Range Comment   WBC 6.9  4.0 - 10.5 (K/uL)    RBC 4.80  3.87 - 5.11 (MIL/uL)    Hemoglobin 14.1  12.0 - 15.0 (g/dL)    HCT 81.1  91.4 - 78.2 (%)    MCV 88.8  78.0 - 100.0 (fL)    MCH 29.4  26.0 - 34.0 (pg)    MCHC 33.1  30.0 - 36.0 (g/dL)    RDW 95.6  21.3 - 08.6 (%)    Platelets 204  150 - 400 (K/uL)   DIFFERENTIAL     Status: Normal   Collection Time   02/02/12 11:00 AM      Component Value Range Comment   Neutrophils Relative 64  43 - 77 (%)    Neutro Abs 4.4  1.7 - 7.7 (K/uL)    Lymphocytes Relative 25  12 - 46 (%)    Lymphs Abs 1.7  0.7 - 4.0 (K/uL)    Monocytes Relative 8  3 - 12 (%)    Monocytes Absolute 0.6  0.1 - 1.0 (K/uL)    Eosinophils Relative 3  0 - 5 (%)    Eosinophils Absolute 0.2  0.0 - 0.7 (K/uL)    Basophils Relative 1  0 - 1 (%)    Basophils Absolute 0.0  0.0 - 0.1 (K/uL)   PROTIME-INR     Status: Abnormal   Collection Time   02/02/12 11:00 AM      Component Value Range Comment   Prothrombin Time 24.6 (*) 11.6 - 15.2 (seconds)    INR 2.18 (*) 0.00 - 1.49      ROS: Review of Systems  HENT: Negative.   Eyes: Negative.   Respiratory: Negative.   Cardiovascular: Negative.   Gastrointestinal: Negative.   Genitourinary: Negative.   Musculoskeletal: Positive for joint pain.  Skin: Negative.   Neurological: Positive for weakness.  Endo/Heme/Allergies: Negative.   Psychiatric/Behavioral: Negative.      Physical Exam: BP: 173/85 ; HR: 85 ; Resp: 16 Physical Exam  Constitutional: She is oriented to person, place, and time and well-developed, well-nourished, and in no distress.  HENT:  Head: Normocephalic and atraumatic.  Nose: Nose normal.    Mouth/Throat: Oropharynx is clear and moist. She has dentures.  Eyes: Pupils are equal, round, and reactive to light.  Neck: Neck supple. No JVD present. No tracheal deviation present. No thyromegaly present.  Cardiovascular: Normal rate, regular rhythm and intact distal pulses.   Murmur heard. Pulmonary/Chest: Effort normal and breath sounds normal. No respiratory distress. She has no wheezes. She has no rales. She exhibits no tenderness.  Abdominal: Soft. There is no tenderness. There is no guarding.  Musculoskeletal:       Right knee:  She exhibits decreased range of motion (Only about 20 degress of movement), swelling and bony tenderness. She exhibits no effusion, no ecchymosis and no laceration. tenderness found.  Lymphadenopathy:    She has no cervical adenopathy.  Neurological: She is alert and oriented to person, place, and time.  Skin: Skin is warm and dry.  Psychiatric: Affect normal.     Assessment/Plan Assessment: right knee OA and pain   Plan: Patient will undergo a right total knee arthroplasty on 02/07/2012 per Dr. Charlann Boxer at [facility]. Risks benefits and expectation were discussed with the patient. Patient understand risks, benefits and expectation and wishes to proceed.  NOTE: Patient is a [employer] and refuses to have any blood products. She is aware of the increased risk and is still willing to proceed.   Anastasio Auerbach Ramondo Dietze   PAC  02/02/2012, 5:10 PM

## 2012-02-02 NOTE — Pre-Procedure Instructions (Signed)
BMET and Urine result faxed to Dr. Charlann Boxer - confirmation received.

## 2012-02-02 NOTE — Patient Instructions (Signed)
20 CHERINE DRUMGOOLE  02/02/2012   Your procedure is scheduled on:  02/07/12  Report to SHORT STAY DEPT  at 8:30 AM.  Call this number if you have problems the morning of surgery: [phone]   Remember:   Do not eat food or drink liquids AFTER MIDNIGHT  May have clear liquids UNTIL 6 HOURS BEFORE SURGERY(5:00 AM)  Clear liquids include soda, tea, black coffee, apple or grape juice, broth.  Take these medicines the morning of surgery with A SIP OF WATER: ATENOLOL / FAMOTIDINE / LEVOTHYROXINE / LYRICA / ZOLOFT   Do not wear jewelry, make-up or nail polish.  Do not wear lotions, powders, or perfumes.   Do not shave legs or underarms 48 hrs. before surgery (men may shave face)  Do not bring valuables to the hospital.  Contacts, dentures or bridgework may not be worn into surgery.  Leave suitcase in the car. After surgery it may be brought to your room.  For patients admitted to the hospital, checkout time is 11:00 AM the day of discharge.   Patients discharged the day of surgery will not be allowed to drive home.    Special Instructions:   Please read over the following fact sheets that you were given: MRSA  Information / Incentive Spirometer               SHOWER WITH BETASEPT THE NIGHT BEFORE SURGERY AND THE MORNING OF SURGERY

## 2012-02-03 ENCOUNTER — Telehealth: Payer: Self-pay | Admitting: Cardiology

## 2012-02-03 NOTE — Telephone Encounter (Signed)
New problem  Per Clydie Braun calling from Dr. Charlann Boxer office , Surgery on 6/3. Knee replacement. Status of cardiac clearance.

## 2012-02-03 NOTE — Telephone Encounter (Signed)
Left message clearance has been faxed, call back if any problems

## 2012-02-04 ENCOUNTER — Telehealth: Payer: Self-pay | Admitting: Cardiology

## 2012-02-04 NOTE — Telephone Encounter (Signed)
New problem    Patient returning call back to nurse.   

## 2012-02-04 NOTE — Telephone Encounter (Signed)
Advised of echo  

## 2012-02-04 NOTE — Telephone Encounter (Signed)
Message copied by Burnell Blanks on Fri Feb 04, 2012 10:19 AM ------      Message from: Cassell Clement      Created: Tue Feb 01, 2012  8:34 PM       Please report.  The echo is satisfactory.  Good LV systolic function EF 55-60%.  Okay for surgery.

## 2012-02-07 ENCOUNTER — Encounter (HOSPITAL_COMMUNITY): Payer: Self-pay

## 2012-02-07 ENCOUNTER — Encounter (HOSPITAL_COMMUNITY): Payer: Self-pay | Admitting: Anesthesiology

## 2012-02-07 ENCOUNTER — Ambulatory Visit (HOSPITAL_COMMUNITY): Payer: 59 | Admitting: Anesthesiology

## 2012-02-07 ENCOUNTER — Encounter (HOSPITAL_COMMUNITY)
Admission: RE | Disposition: A | Discharge status: Home Health | Payer: Self-pay | Source: Ambulatory Visit | Attending: Orthopedic Surgery

## 2012-02-07 ENCOUNTER — Inpatient Hospital Stay (HOSPITAL_COMMUNITY)
Admission: RE | Admit: 2012-02-07 | Discharge: 2012-02-09 | DRG: 470 | Disposition: A | Discharge status: Home Health | Payer: 59 | Source: Ambulatory Visit | Attending: Orthopedic Surgery | Admitting: Orthopedic Surgery

## 2012-02-07 DIAGNOSIS — I129 Hypertensive chronic kidney disease with stage 1 through stage 4 chronic kidney disease, or unspecified chronic kidney disease: Secondary | ICD-10-CM | POA: Diagnosis present

## 2012-02-07 DIAGNOSIS — Z7901 Long term (current) use of anticoagulants: Secondary | ICD-10-CM

## 2012-02-07 DIAGNOSIS — R197 Diarrhea, unspecified: Secondary | ICD-10-CM

## 2012-02-07 DIAGNOSIS — Z01812 Encounter for preprocedural laboratory examination: Secondary | ICD-10-CM

## 2012-02-07 DIAGNOSIS — E039 Hypothyroidism, unspecified: Secondary | ICD-10-CM | POA: Diagnosis present

## 2012-02-07 DIAGNOSIS — Z85528 Personal history of other malignant neoplasm of kidney: Secondary | ICD-10-CM

## 2012-02-07 DIAGNOSIS — Z950 Presence of cardiac pacemaker: Secondary | ICD-10-CM

## 2012-02-07 DIAGNOSIS — F329 Major depressive disorder, single episode, unspecified: Secondary | ICD-10-CM | POA: Diagnosis present

## 2012-02-07 DIAGNOSIS — E785 Hyperlipidemia, unspecified: Secondary | ICD-10-CM | POA: Diagnosis present

## 2012-02-07 DIAGNOSIS — Z905 Acquired absence of kidney: Secondary | ICD-10-CM

## 2012-02-07 DIAGNOSIS — E876 Hypokalemia: Secondary | ICD-10-CM

## 2012-02-07 DIAGNOSIS — K219 Gastro-esophageal reflux disease without esophagitis: Secondary | ICD-10-CM | POA: Diagnosis present

## 2012-02-07 DIAGNOSIS — I4891 Unspecified atrial fibrillation: Secondary | ICD-10-CM | POA: Diagnosis present

## 2012-02-07 DIAGNOSIS — N189 Chronic kidney disease, unspecified: Secondary | ICD-10-CM | POA: Diagnosis present

## 2012-02-07 DIAGNOSIS — Z8542 Personal history of malignant neoplasm of other parts of uterus: Secondary | ICD-10-CM

## 2012-02-07 DIAGNOSIS — Z96659 Presence of unspecified artificial knee joint: Secondary | ICD-10-CM

## 2012-02-07 DIAGNOSIS — M1710 Unilateral primary osteoarthritis, unspecified knee: Principal | ICD-10-CM | POA: Diagnosis present

## 2012-02-07 DIAGNOSIS — F3289 Other specified depressive episodes: Secondary | ICD-10-CM | POA: Diagnosis present

## 2012-02-07 HISTORY — PX: TOTAL KNEE ARTHROPLASTY: SHX125

## 2012-02-07 SURGERY — ARTHROPLASTY, KNEE, TOTAL
Anesthesia: Spinal | Site: Knee | Laterality: Right | Wound class: Clean

## 2012-02-07 MED ORDER — ONDANSETRON HCL 4 MG/2ML IJ SOLN: 4.0000 mg | Freq: Four times a day (QID) | INTRAMUSCULAR | Status: DC | PRN

## 2012-02-07 MED ORDER — LEVOTHYROXINE SODIUM 50 MCG PO TABS
50.0000 ug | ORAL_TABLET | Freq: Every day | ORAL | Status: DC
  Administered 2012-02-08 – 2012-02-09 (×2): 50 ug via ORAL
  Filled 2012-02-07 (×3): qty 1

## 2012-02-07 MED ORDER — ALUM & MAG HYDROXIDE-SIMETH 200-200-20 MG/5ML PO SUSP: 30.0000 mL | ORAL | Status: DC | PRN

## 2012-02-07 MED ORDER — SODIUM CHLORIDE 0.9 % IV SOLN
INTRAVENOUS | Status: DC
  Administered 2012-02-07 – 2012-02-08 (×2): via INTRAVENOUS
  Filled 2012-02-07 (×10): qty 1000

## 2012-02-07 MED ORDER — WARFARIN - PHARMACIST DOSING INPATIENT: Freq: Every day | Status: DC

## 2012-02-07 MED ORDER — SODIUM CHLORIDE 0.9 % IR SOLN
Status: DC | PRN
  Administered 2012-02-07: 1000 mL

## 2012-02-07 MED ORDER — RIVAROXABAN 10 MG PO TABS
10.0000 mg | ORAL_TABLET | ORAL | Status: DC
  Filled 2012-02-07: qty 1

## 2012-02-07 MED ORDER — LOSARTAN POTASSIUM-HCTZ 100-25 MG PO TABS: 1.0000 | ORAL_TABLET | Freq: Every day | ORAL | Status: DC

## 2012-02-07 MED ORDER — CEFAZOLIN SODIUM 1-5 GM-% IV SOLN
1.0000 g | INTRAVENOUS | Status: AC
  Administered 2012-02-07: 1 g via INTRAVENOUS

## 2012-02-07 MED ORDER — BISACODYL 5 MG PO TBEC: 5.0000 mg | DELAYED_RELEASE_TABLET | Freq: Every day | ORAL | Status: DC | PRN

## 2012-02-07 MED ORDER — DEXAMETHASONE SODIUM PHOSPHATE 10 MG/ML IJ SOLN
10.0000 mg | Freq: Once | INTRAMUSCULAR | Status: AC
  Administered 2012-02-08: 10 mg via INTRAVENOUS
  Filled 2012-02-07: qty 1

## 2012-02-07 MED ORDER — LOSARTAN POTASSIUM 50 MG PO TABS
100.0000 mg | ORAL_TABLET | Freq: Every day | ORAL | Status: DC
  Administered 2012-02-07 – 2012-02-09 (×3): 100 mg via ORAL
  Filled 2012-02-07 (×3): qty 2

## 2012-02-07 MED ORDER — ENOXAPARIN SODIUM 40 MG/0.4ML ~~LOC~~ SOLN
40.0000 mg | SUBCUTANEOUS | Status: DC
  Administered 2012-02-08 – 2012-02-09 (×2): 40 mg via SUBCUTANEOUS
  Filled 2012-02-07 (×3): qty 0.4

## 2012-02-07 MED ORDER — ATENOLOL 100 MG PO TABS
100.0000 mg | ORAL_TABLET | Freq: Two times a day (BID) | ORAL | Status: DC
  Administered 2012-02-07 – 2012-02-09 (×4): 100 mg via ORAL
  Filled 2012-02-07 (×5): qty 1

## 2012-02-07 MED ORDER — LACTATED RINGERS IV SOLN: INTRAVENOUS | Status: DC

## 2012-02-07 MED ORDER — WARFARIN SODIUM 3 MG PO TABS
3.0000 mg | ORAL_TABLET | Freq: Once | ORAL | Status: AC
  Administered 2012-02-07: 3 mg via ORAL
  Filled 2012-02-07: qty 1

## 2012-02-07 MED ORDER — LABETALOL HCL 5 MG/ML IV SOLN
INTRAVENOUS | Status: AC
  Filled 2012-02-07: qty 4

## 2012-02-07 MED ORDER — POLYETHYLENE GLYCOL 3350 17 G PO PACK: 17.0000 g | PACK | Freq: Two times a day (BID) | ORAL | Status: DC

## 2012-02-07 MED ORDER — STERILE WATER FOR IRRIGATION IR SOLN
Status: DC | PRN
  Administered 2012-02-07: 1500 mL

## 2012-02-07 MED ORDER — ACETAMINOPHEN 325 MG PO TABS: 650.0000 mg | ORAL_TABLET | Freq: Four times a day (QID) | ORAL | Status: DC | PRN

## 2012-02-07 MED ORDER — SERTRALINE HCL 100 MG PO TABS
100.0000 mg | ORAL_TABLET | Freq: Every day | ORAL | Status: DC
  Administered 2012-02-08 – 2012-02-09 (×2): 100 mg via ORAL
  Filled 2012-02-07 (×4): qty 1

## 2012-02-07 MED ORDER — POTASSIUM CHLORIDE CRYS ER 20 MEQ PO TBCR
20.0000 meq | EXTENDED_RELEASE_TABLET | Freq: Three times a day (TID) | ORAL | Status: DC
  Administered 2012-02-07 – 2012-02-09 (×6): 20 meq via ORAL
  Filled 2012-02-07 (×8): qty 1

## 2012-02-07 MED ORDER — MIDAZOLAM HCL 5 MG/5ML IJ SOLN
INTRAMUSCULAR | Status: DC | PRN
  Administered 2012-02-07: 2 mg via INTRAVENOUS
  Administered 2012-02-07: 1 mg via INTRAVENOUS

## 2012-02-07 MED ORDER — HYDROCHLOROTHIAZIDE 25 MG PO TABS
25.0000 mg | ORAL_TABLET | Freq: Every day | ORAL | Status: DC
  Administered 2012-02-07 – 2012-02-09 (×3): 25 mg via ORAL
  Filled 2012-02-07 (×3): qty 1

## 2012-02-07 MED ORDER — FENTANYL CITRATE 0.05 MG/ML IJ SOLN
INTRAMUSCULAR | Status: DC | PRN
  Administered 2012-02-07: 100 ug via INTRAVENOUS

## 2012-02-07 MED ORDER — FAMOTIDINE 10 MG PO TABS
10.0000 mg | ORAL_TABLET | Freq: Every day | ORAL | Status: DC
  Administered 2012-02-07 – 2012-02-09 (×3): 10 mg via ORAL
  Filled 2012-02-07 (×3): qty 1

## 2012-02-07 MED ORDER — DOCUSATE SODIUM 100 MG PO CAPS
100.0000 mg | ORAL_CAPSULE | Freq: Two times a day (BID) | ORAL | Status: DC
  Administered 2012-02-07 – 2012-02-08 (×3): 100 mg via ORAL

## 2012-02-07 MED ORDER — METHOCARBAMOL 500 MG PO TABS
500.0000 mg | ORAL_TABLET | Freq: Four times a day (QID) | ORAL | Status: DC | PRN
  Administered 2012-02-07 – 2012-02-08 (×2): 500 mg via ORAL
  Filled 2012-02-07 (×2): qty 1

## 2012-02-07 MED ORDER — DEXAMETHASONE SODIUM PHOSPHATE 10 MG/ML IJ SOLN
10.0000 mg | Freq: Once | INTRAMUSCULAR | Status: AC
  Administered 2012-02-07: 10 mg via INTRAVENOUS
  Filled 2012-02-07: qty 1

## 2012-02-07 MED ORDER — HYDROMORPHONE HCL PF 1 MG/ML IJ SOLN
0.5000 mg | INTRAMUSCULAR | Status: DC | PRN
  Administered 2012-02-07: 0.5 mg via INTRAVENOUS
  Filled 2012-02-07: qty 1

## 2012-02-07 MED ORDER — ACETAMINOPHEN 10 MG/ML IV SOLN
INTRAVENOUS | Status: DC | PRN
  Administered 2012-02-07: 1000 mg via INTRAVENOUS

## 2012-02-07 MED ORDER — MEPERIDINE HCL 50 MG/ML IJ SOLN: 6.2500 mg | INTRAMUSCULAR | Status: DC | PRN

## 2012-02-07 MED ORDER — ACETAMINOPHEN 650 MG RE SUPP: 650.0000 mg | Freq: Four times a day (QID) | RECTAL | Status: DC | PRN

## 2012-02-07 MED ORDER — HYDROCODONE-ACETAMINOPHEN 7.5-325 MG PO TABS
1.0000 | ORAL_TABLET | ORAL | Status: DC
  Administered 2012-02-07 (×2): 1 via ORAL
  Administered 2012-02-08: 2 via ORAL
  Administered 2012-02-08: 1 via ORAL
  Administered 2012-02-08 (×2): 2 via ORAL
  Administered 2012-02-08 – 2012-02-09 (×4): 1 via ORAL
  Filled 2012-02-07 (×3): qty 2
  Filled 2012-02-07 (×2): qty 1
  Filled 2012-02-07: qty 2
  Filled 2012-02-07: qty 1
  Filled 2012-02-07: qty 2
  Filled 2012-02-07: qty 1
  Filled 2012-02-07: qty 2
  Filled 2012-02-07 (×2): qty 1

## 2012-02-07 MED ORDER — HYDROMORPHONE HCL PF 1 MG/ML IJ SOLN: 0.2500 mg | INTRAMUSCULAR | Status: DC | PRN

## 2012-02-07 MED ORDER — CEFAZOLIN SODIUM 1-5 GM-% IV SOLN
INTRAVENOUS | Status: AC
  Filled 2012-02-07: qty 50

## 2012-02-07 MED ORDER — BUPIVACAINE HCL 0.75 % IJ SOLN
INTRAMUSCULAR | Status: DC | PRN
  Administered 2012-02-07: 1.2 mg

## 2012-02-07 MED ORDER — FUROSEMIDE 20 MG PO TABS
20.0000 mg | ORAL_TABLET | ORAL | Status: DC
  Administered 2012-02-07 – 2012-02-09 (×2): 20 mg via ORAL
  Filled 2012-02-07 (×2): qty 1

## 2012-02-07 MED ORDER — DIPHENHYDRAMINE HCL 25 MG PO CAPS: 25.0000 mg | ORAL_CAPSULE | Freq: Four times a day (QID) | ORAL | Status: DC | PRN

## 2012-02-07 MED ORDER — LACTATED RINGERS IV SOLN
INTRAVENOUS | Status: DC
  Administered 2012-02-07: 1000 mL via INTRAVENOUS
  Administered 2012-02-07: 13:00:00 via INTRAVENOUS

## 2012-02-07 MED ORDER — ONDANSETRON HCL 4 MG PO TABS: 4.0000 mg | ORAL_TABLET | Freq: Four times a day (QID) | ORAL | Status: DC | PRN

## 2012-02-07 MED ORDER — KETAMINE HCL 10 MG/ML IJ SOLN
INTRAMUSCULAR | Status: DC | PRN
  Administered 2012-02-07 (×4): 10 mg via INTRAVENOUS

## 2012-02-07 MED ORDER — AMIODARONE HCL 200 MG PO TABS
200.0000 mg | ORAL_TABLET | Freq: Every day | ORAL | Status: DC
  Administered 2012-02-07 – 2012-02-09 (×3): 200 mg via ORAL
  Filled 2012-02-07 (×3): qty 1

## 2012-02-07 MED ORDER — ZOLPIDEM TARTRATE 5 MG PO TABS: 5.0000 mg | ORAL_TABLET | Freq: Every evening | ORAL | Status: DC | PRN

## 2012-02-07 MED ORDER — PROPOFOL 10 MG/ML IV EMUL
INTRAVENOUS | Status: DC | PRN
  Administered 2012-02-07: 75 ug/kg/min via INTRAVENOUS

## 2012-02-07 MED ORDER — FERROUS SULFATE 325 (65 FE) MG PO TABS
325.0000 mg | ORAL_TABLET | Freq: Three times a day (TID) | ORAL | Status: DC
  Administered 2012-02-07 – 2012-02-09 (×5): 325 mg via ORAL
  Filled 2012-02-07 (×9): qty 1

## 2012-02-07 MED ORDER — LABETALOL HCL 5 MG/ML IV SOLN
10.0000 mg | INTRAVENOUS | Status: DC | PRN
  Administered 2012-02-07 (×2): 10 mg via INTRAVENOUS

## 2012-02-07 MED ORDER — MENTHOL 3 MG MT LOZG
1.0000 | LOZENGE | OROMUCOSAL | Status: DC | PRN
  Filled 2012-02-07: qty 9

## 2012-02-07 MED ORDER — KETOROLAC TROMETHAMINE 30 MG/ML IJ SOLN
INTRAMUSCULAR | Status: DC | PRN
  Administered 2012-02-07: 30 mg

## 2012-02-07 MED ORDER — ATORVASTATIN CALCIUM 20 MG PO TABS
20.0000 mg | ORAL_TABLET | Freq: Every day | ORAL | Status: DC
  Filled 2012-02-07: qty 1

## 2012-02-07 MED ORDER — ATORVASTATIN CALCIUM 20 MG PO TABS
20.0000 mg | ORAL_TABLET | ORAL | Status: DC
  Administered 2012-02-07 – 2012-02-08 (×2): 20 mg via ORAL
  Filled 2012-02-07 (×3): qty 1

## 2012-02-07 MED ORDER — ACETAMINOPHEN 10 MG/ML IV SOLN
INTRAVENOUS | Status: AC
  Filled 2012-02-07: qty 100

## 2012-02-07 MED ORDER — KETOROLAC TROMETHAMINE 30 MG/ML IJ SOLN
INTRAMUSCULAR | Status: AC
  Filled 2012-02-07: qty 1

## 2012-02-07 MED ORDER — PHENYLEPHRINE HCL 10 MG/ML IJ SOLN
10.0000 mg | INTRAMUSCULAR | Status: DC | PRN
  Administered 2012-02-07: 40 ug/min via INTRAVENOUS

## 2012-02-07 MED ORDER — PREGABALIN 75 MG PO CAPS
75.0000 mg | ORAL_CAPSULE | Freq: Two times a day (BID) | ORAL | Status: DC
  Administered 2012-02-07 – 2012-02-09 (×4): 75 mg via ORAL
  Filled 2012-02-07 (×4): qty 1

## 2012-02-07 MED ORDER — BUPIVACAINE-EPINEPHRINE PF 0.25-1:200000 % IJ SOLN
INTRAMUSCULAR | Status: AC
  Filled 2012-02-07: qty 30

## 2012-02-07 MED ORDER — CEFAZOLIN SODIUM 1-5 GM-% IV SOLN
1.0000 g | Freq: Four times a day (QID) | INTRAVENOUS | Status: AC
  Administered 2012-02-07 – 2012-02-08 (×3): 1 g via INTRAVENOUS
  Filled 2012-02-07 (×3): qty 50

## 2012-02-07 MED ORDER — PROMETHAZINE HCL 25 MG/ML IJ SOLN: 6.2500 mg | INTRAMUSCULAR | Status: DC | PRN

## 2012-02-07 MED ORDER — TRANEXAMIC ACID 100 MG/ML IV SOLN
1000.0000 mg | Freq: Once | INTRAVENOUS | Status: AC
  Administered 2012-02-07: 1000 mg via INTRAVENOUS
  Filled 2012-02-07: qty 10

## 2012-02-07 MED ORDER — BUPIVACAINE-EPINEPHRINE PF 0.25-1:200000 % IJ SOLN
INTRAMUSCULAR | Status: DC | PRN
  Administered 2012-02-07: 30 mL

## 2012-02-07 MED ORDER — NITROGLYCERIN 0.4 MG SL SUBL
0.4000 mg | SUBLINGUAL_TABLET | SUBLINGUAL | Status: DC | PRN
Start: 2012-02-07 — End: 2012-02-09

## 2012-02-07 MED ORDER — METHOCARBAMOL 100 MG/ML IJ SOLN
500.0000 mg | Freq: Four times a day (QID) | INTRAVENOUS | Status: DC | PRN
  Administered 2012-02-07: 500 mg via INTRAVENOUS
  Filled 2012-02-07: qty 5

## 2012-02-07 MED ORDER — PHENOL 1.4 % MT LIQD
1.0000 | OROMUCOSAL | Status: DC | PRN
  Filled 2012-02-07: qty 177

## 2012-02-07 MED ORDER — FLEET ENEMA 7-19 GM/118ML RE ENEM: 1.0000 | ENEMA | Freq: Once | RECTAL | Status: AC | PRN

## 2012-02-07 MED ORDER — NITROGLYCERIN 0.4 MG/HR TD PT24
0.4000 mg | MEDICATED_PATCH | TRANSDERMAL | Status: DC | PRN
  Filled 2012-02-07: qty 1

## 2012-02-07 MED ORDER — OMEGA-3-ACID ETHYL ESTERS 1 G PO CAPS
1.0000 g | ORAL_CAPSULE | Freq: Every day | ORAL | Status: DC
  Administered 2012-02-09: 1 g via ORAL
  Filled 2012-02-07 (×3): qty 1

## 2012-02-07 SURGICAL SUPPLY — 56 items
BAG ZIPLOCK 12X15 (MISCELLANEOUS) ×2 IMPLANT
BANDAGE ELASTIC 6 VELCRO ST LF (GAUZE/BANDAGES/DRESSINGS) ×2 IMPLANT
BANDAGE ESMARK 6X9 LF (GAUZE/BANDAGES/DRESSINGS) ×1 IMPLANT
BLADE SAW SGTL 13.0X1.19X90.0M (BLADE) ×2 IMPLANT
BNDG ESMARK 6X9 LF (GAUZE/BANDAGES/DRESSINGS) ×2
BOWL SMART MIX CTS (DISPOSABLE) ×2 IMPLANT
CEMENT HV SMART SET (Cement) ×2 IMPLANT
CLOTH BEACON ORANGE TIMEOUT ST (SAFETY) ×2 IMPLANT
CUFF TOURN SGL QUICK 34 (TOURNIQUET CUFF) ×1
CUFF TRNQT CYL 34X4X40X1 (TOURNIQUET CUFF) ×1 IMPLANT
DECANTER SPIKE VIAL GLASS SM (MISCELLANEOUS) IMPLANT
DERMABOND ADVANCED (GAUZE/BANDAGES/DRESSINGS) ×1
DERMABOND ADVANCED .7 DNX12 (GAUZE/BANDAGES/DRESSINGS) ×1 IMPLANT
DRAPE EXTREMITY T 121X128X90 (DRAPE) ×2 IMPLANT
DRAPE POUCH INSTRU U-SHP 10X18 (DRAPES) ×2 IMPLANT
DRAPE U-SHAPE 47X51 STRL (DRAPES) ×2 IMPLANT
DRSG AQUACEL AG ADV 3.5X10 (GAUZE/BANDAGES/DRESSINGS) ×2 IMPLANT
DRSG TEGADERM 4X4.75 (GAUZE/BANDAGES/DRESSINGS) ×2 IMPLANT
DURAPREP 26ML APPLICATOR (WOUND CARE) ×2 IMPLANT
ELECT REM PT RETURN 9FT ADLT (ELECTROSURGICAL) ×2
ELECTRODE REM PT RTRN 9FT ADLT (ELECTROSURGICAL) ×1 IMPLANT
EVACUATOR 1/8 PVC DRAIN (DRAIN) ×2 IMPLANT
FACESHIELD LNG OPTICON STERILE (SAFETY) ×10 IMPLANT
GAUZE SPONGE 2X2 8PLY STRL LF (GAUZE/BANDAGES/DRESSINGS) ×1 IMPLANT
GLOVE BIOGEL PI IND STRL 7.5 (GLOVE) ×1 IMPLANT
GLOVE BIOGEL PI IND STRL 8 (GLOVE) ×1 IMPLANT
GLOVE BIOGEL PI INDICATOR 7.5 (GLOVE) ×1
GLOVE BIOGEL PI INDICATOR 8 (GLOVE) ×1
GLOVE ECLIPSE 8.0 STRL XLNG CF (GLOVE) ×2 IMPLANT
GLOVE ORTHO TXT STRL SZ7.5 (GLOVE) ×4 IMPLANT
GOWN BRE IMP PREV XXLGXLNG (GOWN DISPOSABLE) ×4 IMPLANT
GOWN STRL NON-REIN LRG LVL3 (GOWN DISPOSABLE) ×2 IMPLANT
HANDPIECE INTERPULSE COAX TIP (DISPOSABLE) ×1
IMMOBILIZER KNEE 20 (SOFTGOODS)
IMMOBILIZER KNEE 20 THIGH 36 (SOFTGOODS) IMPLANT
KIT BASIN OR (CUSTOM PROCEDURE TRAY) ×2 IMPLANT
MANIFOLD NEPTUNE II (INSTRUMENTS) ×2 IMPLANT
NDL SAFETY ECLIPSE 18X1.5 (NEEDLE) ×1 IMPLANT
NEEDLE HYPO 18GX1.5 SHARP (NEEDLE) ×1
NS IRRIG 1000ML POUR BTL (IV SOLUTION) ×2 IMPLANT
PACK TOTAL JOINT (CUSTOM PROCEDURE TRAY) ×2 IMPLANT
POSITIONER SURGICAL ARM (MISCELLANEOUS) ×2 IMPLANT
SET HNDPC FAN SPRY TIP SCT (DISPOSABLE) ×1 IMPLANT
SET PAD KNEE POSITIONER (MISCELLANEOUS) ×2 IMPLANT
SPONGE GAUZE 2X2 STER 10/PKG (GAUZE/BANDAGES/DRESSINGS) ×1
SUCTION FRAZIER 12FR DISP (SUCTIONS) ×2 IMPLANT
SUT MNCRL AB 4-0 PS2 18 (SUTURE) ×2 IMPLANT
SUT VIC AB 1 CT1 36 (SUTURE) ×2 IMPLANT
SUT VIC AB 2-0 CT1 27 (SUTURE) ×3
SUT VIC AB 2-0 CT1 TAPERPNT 27 (SUTURE) ×3 IMPLANT
SUT VLOC 180 0 24IN GS25 (SUTURE) ×2 IMPLANT
SYR 50ML LL SCALE MARK (SYRINGE) ×2 IMPLANT
TOWEL OR 17X26 10 PK STRL BLUE (TOWEL DISPOSABLE) ×4 IMPLANT
TRAY FOLEY CATH 14FRSI W/METER (CATHETERS) ×2 IMPLANT
WATER STERILE IRR 1500ML POUR (IV SOLUTION) ×2 IMPLANT
WRAP KNEE MAXI GEL POST OP (GAUZE/BANDAGES/DRESSINGS) ×2 IMPLANT

## 2012-02-07 NOTE — Interval H&P Note (Signed)
History and Physical Interval Note:  02/07/2012 10:47 AM  [name]  has presented today for surgery, with the diagnosis of Osteoarthritis of the Right Knee  The various methods of treatment have been discussed with the patient and family. After consideration of risks, benefits and other options for treatment, the patient has consented to  Procedure(s) (LRB): RIGHT TOTAL KNEE ARTHROPLASTY (Right) as a surgical intervention .  The patients' history has been reviewed, patient examined, no change in status, stable for surgery.  I have reviewed the patients' chart and labs.  Questions were answered to the patient's satisfaction.     Shelda Pal

## 2012-02-07 NOTE — Transfer of Care (Signed)
Immediate Anesthesia Transfer of Care Note  Patient: [name]  Procedure(s) Performed: Procedure(s) (LRB): TOTAL KNEE ARTHROPLASTY (Right)  Patient Location: PACU  Anesthesia Type: Spinal  Level of Consciousness: awake, alert , oriented and patient cooperative  Airway & Oxygen Therapy: Patient Spontanous Breathing and Patient connected to face mask oxygen  Post-op Assessment: Report given to PACU RN, Post -op Vital signs reviewed and stable and SAB L2  Post vital signs: Reviewed and stable  Complications: No apparent anesthesia complications

## 2012-02-07 NOTE — Op Note (Signed)
NAME:  [name]                      MEDICAL RECORD NO.:  [id number]                             FACILITY:  [facility]      PHYSICIAN:  Madlyn Frankel. Charlann Boxer, M.D.  DATE OF BIRTH:  1934-06-10      DATE OF PROCEDURE:  02/07/2012                                     OPERATIVE REPORT         PREOPERATIVE DIAGNOSIS:  Right knee osteoarthritis.      POSTOPERATIVE DIAGNOSIS:  Right knee osteoarthritis.      FINDINGS:  The patient was noted to have complete loss of cartilage and   bone-on-bone arthritis with associated osteophytes in the lateral and patellofemoral compartments of   the knee with a significant synovitis and associated effusion.      PROCEDURE:  Right total knee replacement.      COMPONENTS USED:  DePuy rotating platform posterior stabilized knee   system, a size 2 femur, 2 tibia, 10 mm insert, and 32 patellar   button.      SURGEON:  Madlyn Frankel. Charlann Boxer, M.D.      ASSISTANT:  Lanney Gins, PA-C.      ANESTHESIA:  Spinal.      SPECIMENS:  None.      COMPLICATION:  None.      DRAINS:  One Hemovac.  EBL: <50cc      TOURNIQUET TIME:   Total Tourniquet Time Documented: Thigh (Right) - 57 minutes .      The patient was stable to the recovery room.      INDICATION FOR PROCEDURE:  [name] is a 78 y.o. female patient of   mine.  The patient had been seen, evaluated, and treated conservatively in the   office with medication, activity modification, and injections.  The patient had   radiographic changes of bone-on-bone arthritis with endplate sclerosis and osteophytes noted.      The patient failed conservative measures including medication, injections, and activity modification, and at this point was ready for more definitive measures.   Based on the radiographic changes and failed conservative measures, the patient   decided to proceed with total knee replacement.  Risks of infection,   DVT, component failure, need for revision surgery, postop course, and   expectations were all   discussed and reviewed.  Consent was obtained for benefit of pain   relief.      PROCEDURE IN DETAIL:  The patient was brought to the operative theater.   Once adequate anesthesia, preoperative antibiotics, 1 gm of Ancef administered, the patient was positioned supine with the right thigh tourniquet placed.  The  right lower extremity was prepped and draped in sterile fashion.  A time-   out was performed identifying the patient, planned procedure, and   extremity.      The right lower extremity was placed in the [facility] leg holder.  The leg was   exsanguinated, tourniquet elevated to 250 mmHg.  A midline incision was   made followed by median parapatellar arthrotomy and quadricep snip to facilitate exposure.  Patient had a significantly limited range of motion of only  15-20 degrees.  Following initial   exposure, attention was first directed to the patella.  Precut   measurement was noted to be 21 mm.  I resected down to 15 mm and used a   32 patellar button to restore patellar height as well as cover the cut   surface.      The lug holes were drilled and a metal shim was placed to protect the   patella from retractors and saw blades.      At this point, attention was now directed to the femur.  The femoral   canal was opened with a drill, irrigated to try to prevent fat emboli.  An   intramedullary rod was passed at 3 degrees valgus, 10 mm of bone was   resected off the distal femur.  Following this resection, the tibia was   subluxated anteriorly.  Using the extramedullary guide, 10 mm of bone was resected off   the proximal lateral tibia.  We confirmed the gap would be   stable medially and laterally with a 10 mm insert as well as confirmed   the cut was perpendicular in the coronal plane, checking with an alignment rod.      Once this was done, I sized the femur to be a size 2 in the anterior-   posterior dimension, chose a standard component based on medial  and   lateral dimension.  The size 2 rotation block was then pinned in   position anterior referenced using the C-clamp to set rotation.  The   anterior, posterior, and  chamfer cuts were made without difficulty nor   notching making certain that I was along the anterior cortex to help   with flexion gap stability.      The final box cut was made off the lateral aspect of distal femur.      At this point, the tibia was sized to be a size 2, the size 2 tray was   then pinned in position through the medial third of the tubercle,   drilled, and keel punched.  Trial reduction was now carried with a 2 femur,  2 tibia, a 10 mm insert, and the 32 patella botton.  The knee was brought to   extension, full extension with good flexion stability with the patella   tracking through the trochlea without application of pressure.  Given   all these findings, the trial components removed.  Final components were   opened and cement was mixed.  The knee was irrigated with normal saline   solution and pulse lavage.  The synovial lining was   then injected with 0.25% Marcaine with epinephrine and 1 cc of Toradol,   total of 61 cc.      The knee was irrigated.  Final implants were then cemented onto clean and   dried cut surfaces of bone with the knee brought to extension with a 10   mm trial insert.      Once the cement had fully cured, the excess cement was removed   throughout the knee.  I confirmed I was satisfied with the range of   motion and stability, and the final 10 mm insert was chosen.  It was   placed into the knee.      The tourniquet had been let down at 56 minutes.  No significant   hemostasis required.  The medium Hemovac drain was placed deep.  The   extensor mechanism  And the quadricep snip  was then reapproximated using #1 Vicryl and the 0 V- lock suture with the knee   in extension based on the significant limit flexion.  The   remaining wound was closed with 2-0 Vicryl and running  4-0 Monocryl.   The knee was cleaned, dried, dressed sterilely using Dermabond and   Aquacel dressing.  Drain site dressed separately.  The patient was then   brought to recovery room in stable condition, tolerating the procedure   well.   Please note that Physician Assistant, Lanney Gins, was present for the entirety of the case, and was utilized for pre-operative positioning, peri-operative retractor management, general facilitation of the procedure.  He was also utilized for primary wound closure at the end of the case.              Madlyn Frankel Charlann Boxer, M.D.

## 2012-02-07 NOTE — Progress Notes (Signed)
Pt's blood pressure elevated; Dr. Acey Lav, anesthes. Notified, order rec'd and med given

## 2012-02-07 NOTE — Progress Notes (Signed)
ANTICOAGULATION CONSULT NOTE - Follow Up Consult  Pharmacy Consult for Coumadin Indication: Hx Afib, post-op VTE prophylaxis  Allergies  Allergen Reactions  . Codeine Nausea And Vomiting  . Zinc     nausea    Patient Measurements: Height: 5\' 1"  (154.9 cm) Weight: 146 lb (66.225 kg) IBW/kg (Calculated) : 47.8    Vital Signs: Temp: 97.5 F (36.4 C) (06/03 1455) Temp src: Oral (06/03 0823) BP: 187/81 mmHg (06/03 1455) Pulse Rate: 63  (06/03 1455)  Labs:  Basename 02/07/12 0856  HGB --  HCT --  PLT --  APTT --  LABPROT 15.0  INR 1.16  HEPARINUNFRC --  CREATININE --  CKTOTAL --  CKMB --  TROPONINI --    Estimated Creatinine Clearance: 31.4 ml/min (by C-G formula based on Cr of 1.35).   Medications:  Scheduled:    . amiodarone  200 mg Oral Daily  . atenolol  100 mg Oral BID  . atorvastatin  20 mg Oral q1800  .  ceFAZolin (ANCEF) IV  1 g Intravenous 60 min Pre-Op  .  ceFAZolin (ANCEF) IV  1 g Intravenous Q6H  . dexamethasone  10 mg Intravenous Once  . dexamethasone  10 mg Intravenous Once  . docusate sodium  100 mg Oral BID  . enoxaparin  40 mg Subcutaneous Q24H  . famotidine  10 mg Oral Daily  . ferrous sulfate  325 mg Oral TID PC  . furosemide  20 mg Oral See admin instructions  . HYDROcodone-acetaminophen  1-2 tablet Oral Q4H  . labetalol      . levothyroxine  50 mcg Oral QAC breakfast  . losartan-hydrochlorothiazide  1 tablet Oral Daily  . omega-3 acid ethyl esters  1 g Oral Daily  . polyethylene glycol  17 g Oral BID  . potassium chloride SA  20 mEq Oral TID  . pregabalin  75 mg Oral BID  . rivaroxaban  10 mg Oral Q24H  . sertraline  100 mg Oral Daily  . tranexamic acid (CYLOKAPRON) IVPB (ORTHO)  1,000 mg Intravenous Once   Infusions:    . sodium chloride 0.9 % 1,000 mL with potassium chloride 10 mEq infusion 100 mL/hr at 02/07/12 1437  . DISCONTD: lactated ringers    . DISCONTD: lactated ringers      Assessment:  9 YOF s/p right TKA on  6/3  On chronic coumadin for Afib, chronic amiodarone also  Home dose is 2mg  daily except 1mg  Tues/Thurs, last dose 5/28 in anticipation of surgery (INR 2.18)  Resuming today post-op - begin with 1.5x usual maintenance dose  Goal of Therapy:  INR 2-3    Plan:   Coumadin 3mg  po today  Lovenox 40mg  q24h, d/c when INR >/= 1.8 per MD  Daily PT/INR  Loralee Pacas, PharmD, BCPS Pager: [phone] 02/07/2012,3:17 PM

## 2012-02-07 NOTE — Anesthesia Preprocedure Evaluation (Addendum)
Anesthesia Evaluation  Patient identified by MRN, date of birth, ID band Patient awake    Reviewed: Allergy & Precautions, H&P , NPO status , Patient's Chart, lab work & pertinent test results  History of Anesthesia Complications (+) PONV  Airway Mallampati: II TM Distance: >3 FB Neck ROM: Full    Dental No notable dental hx.    Pulmonary neg pulmonary ROS,  breath sounds clear to auscultation  Pulmonary exam normal       Cardiovascular hypertension, Pt. on medications + CAD negative cardio ROS  + dysrhythmias Atrial Fibrillation + pacemaker Rhythm:Regular Rate:Normal     Neuro/Psych PSYCHIATRIC DISORDERS Anxiety Depression negative neurological ROS  negative psych ROS   GI/Hepatic negative GI ROS, Neg liver ROS,   Endo/Other  negative endocrine ROSHypothyroidism   Renal/GU negative Renal ROS  negative genitourinary   Musculoskeletal negative musculoskeletal ROS (+)   Abdominal   Peds negative pediatric ROS (+)  Hematology negative hematology ROS (+)   Anesthesia Other Findings   Reproductive/Obstetrics negative OB ROS                           Anesthesia Physical Anesthesia Plan  ASA: III  Anesthesia Plan: Spinal   Post-op Pain Management:    Induction:   Airway Management Planned:   Additional Equipment:   Intra-op Plan:   Post-operative Plan:   Informed Consent: I have reviewed the patients History and Physical, chart, labs and discussed the procedure including the risks, benefits and alternatives for the proposed anesthesia with the patient or authorized representative who has indicated his/her understanding and acceptance.   Dental advisory given  Plan Discussed with: CRNA  Anesthesia Plan Comments:        Anesthesia Quick Evaluation

## 2012-02-07 NOTE — Anesthesia Postprocedure Evaluation (Signed)
  Anesthesia Post-op Note  Patient: [name]  Procedure(s) Performed: Procedure(s) (LRB): TOTAL KNEE ARTHROPLASTY (Right)  Patient Location: PACU  Anesthesia Type: Spinal  Level of Consciousness: awake and alert   Airway and Oxygen Therapy: Patient Spontanous Breathing  Post-op Pain: mild  Post-op Assessment: Post-op Vital signs reviewed, Patient's Cardiovascular Status Stable, Respiratory Function Stable, Patent Airway and No signs of Nausea or vomiting  Post-op Vital Signs: stable  Complications: No apparent anesthesia complications

## 2012-02-07 NOTE — Anesthesia Procedure Notes (Addendum)
Spinal  Patient location during procedure: OR End time: 02/07/2012 11:06 AM Staffing CRNA/Resident: Enriqueta Shutter Other anesthesia staff: Elyn Peers Performed by: resident/CRNA  Preanesthetic Checklist Completed: patient identified, site marked, surgical consent, pre-op evaluation, timeout performed, IV checked, risks and benefits discussed and monitors and equipment checked Spinal Block Patient position: sitting Prep: Betadine Patient monitoring: heart rate, continuous pulse ox and blood pressure Approach: midline Location: L2-3 Injection technique: single-shot Needle Needle type: Pencil-Tip  Needle gauge: 24 G Needle length: 5 cm Assessment Sensory level: T6 Additional Notes Expiration date of kit checked and confirmed. Patient tolerated procedure well, without complications. Clear CSF, negative heme, negative parathesia

## 2012-02-08 LAB — PROTIME-INR
INR: 1.22 (ref 0.00–1.49)
Prothrombin Time: 15.7 seconds — ABNORMAL HIGH (ref 11.6–15.2)

## 2012-02-08 LAB — BASIC METABOLIC PANEL
CO2: 25 mEq/L (ref 19–32)
Chloride: 104 mEq/L (ref 96–112)
GFR calc Af Amer: 65 mL/min — ABNORMAL LOW (ref >90)
Sodium: 139 mEq/L (ref 135–145)

## 2012-02-08 LAB — CBC
Platelets: 167 10*3/uL (ref 150–400)
RBC: 3.89 MIL/uL (ref 3.87–5.11)
WBC: 13 10*3/uL — ABNORMAL HIGH (ref 4.0–10.5)

## 2012-02-08 MED ORDER — WARFARIN SODIUM 3 MG PO TABS
3.0000 mg | ORAL_TABLET | Freq: Once | ORAL | Status: AC
  Administered 2012-02-08: 3 mg via ORAL
  Filled 2012-02-08: qty 1

## 2012-02-08 NOTE — Progress Notes (Signed)
OT Note: Checked with pt and she has no OT needs at this time.  She has had hip sx and has assistance and DME. Screen only.  [place], [place] [phone] 02/08/2012

## 2012-02-08 NOTE — Plan of Care (Signed)
Problem: Consults Goal: Diagnosis- Total Joint Replacement Outcome: Progressing Primary Total Knee     

## 2012-02-08 NOTE — Progress Notes (Addendum)
Subjective: 1 Day Post-Op Procedure(s) (LRB): TOTAL KNEE ARTHROPLASTY (Right)   Patient reports pain as mild, pain well controlled. No events throughout the night.  Objective:   VITALS:   Filed Vitals:   02/08/12 0530  BP: 147/66  Pulse: 62  Temp: 98 F (36.7 C)  Resp: 16    Neurovascular intact Dorsiflexion/Plantar flexion intact Incision: dressing C/D/I No cellulitis present Compartment soft  LABS  Basename 02/08/12 0436  HGB 11.1*  HCT 34.3*  WBC 13.0*  PLT 167     Basename 02/08/12 0436  NA 139  K 3.7  BUN 13  CREATININE 0.97  GLUCOSE 144*     Assessment/Plan: 1 Day Post-Op Procedure(s) (LRB): TOTAL KNEE ARTHROPLASTY (Right)   Foley cath d/c'ed Advance diet Up with therapy D/C IV fluids Plan for discharge tomorrow to home, if continues to do well.  NO ROM with PT, do to so many years of not moving knee. Will add CPM machine 0-20 as tolerated  Anastasio Auerbach. Jony Ladnier   PAC  02/08/2012, 7:26 AM

## 2012-02-08 NOTE — Progress Notes (Signed)
ANTICOAGULATION CONSULT NOTE - Follow Up Consult  Pharmacy Consult for Coumadin Indication: Hx Afib, post-op VTE prophylaxis  Allergies  Allergen Reactions  . Codeine Nausea And Vomiting  . Zinc     nausea    Patient Measurements: Height: 5\' 1"  (154.9 cm) Weight: 146 lb (66.225 kg) IBW/kg (Calculated) : 47.8    Vital Signs: Temp: 98 F (36.7 C) (06/04 0530) Temp src: Oral (06/04 0530) BP: 147/66 mmHg (06/04 0530) Pulse Rate: 62  (06/04 0530)  Labs:  Basename 02/08/12 0436 02/07/12 0856  HGB 11.1* --  HCT 34.3* --  PLT 167 --  APTT -- --  LABPROT 15.7* 15.0  INR 1.22 1.16  HEPARINUNFRC -- --  CREATININE 0.97 --  CKTOTAL -- --  CKMB -- --  TROPONINI -- --    Estimated Creatinine Clearance: 43.7 ml/min (by C-G formula based on Cr of 0.97).   Medications:  Scheduled:     . amiodarone  200 mg Oral Daily  . atenolol  100 mg Oral BID  . atorvastatin  20 mg Oral Custom  .  ceFAZolin (ANCEF) IV  1 g Intravenous 60 min Pre-Op  .  ceFAZolin (ANCEF) IV  1 g Intravenous Q6H  . dexamethasone  10 mg Intravenous Once  . dexamethasone  10 mg Intravenous Once  . docusate sodium  100 mg Oral BID  . enoxaparin  40 mg Subcutaneous Q24H  . famotidine  10 mg Oral Daily  . ferrous sulfate  325 mg Oral TID PC  . furosemide  20 mg Oral Custom  . losartan  100 mg Oral Daily   And  . hydrochlorothiazide  25 mg Oral Daily  . HYDROcodone-acetaminophen  1-2 tablet Oral Q4H  . labetalol      . levothyroxine  50 mcg Oral QAC breakfast  . omega-3 acid ethyl esters  1 g Oral Daily  . polyethylene glycol  17 g Oral BID  . potassium chloride SA  20 mEq Oral TID  . pregabalin  75 mg Oral BID  . sertraline  100 mg Oral Daily  . tranexamic acid (CYLOKAPRON) IVPB (ORTHO)  1,000 mg Intravenous Once  . warfarin  3 mg Oral Once  . Warfarin - Pharmacist Dosing Inpatient   Does not apply q1800  . DISCONTD: atorvastatin  20 mg Oral q1800  . DISCONTD: losartan-hydrochlorothiazide  1 tablet  Oral Daily  . DISCONTD: rivaroxaban  10 mg Oral Q24H   Infusions:     . sodium chloride 0.9 % 1,000 mL with potassium chloride 10 mEq infusion 100 mL/hr at 02/08/12 0043  . DISCONTD: lactated ringers    . DISCONTD: lactated ringers      Assessment:  75 YOF s/p right TKA on 6/3  On chronic coumadin for Afib, chronic amiodarone also  Home warfarin dose is 2mg  daily except 1mg  Tues/Thurs, last dose 5/28 in anticipation of surgery (INR 2.18)  Warfarin resumed yesterday (evening of surgery) with 3mg  x 1.  INR beginning to respond.  Goal of Therapy:  INR 2-3    Plan:   Repeat warfarin 3mg  po today  As ordered by ortho: Lovenox 40mg  q24h, d/c when INR >/= 1.8 per MD  Daily PT/INR  Johanan Skorupski, Ky Barban, PharmD, BCPS Pager: [phone] 02/08/2012 8:29 AM

## 2012-02-08 NOTE — Progress Notes (Signed)
02/08/12 1200  PT Visit Information  Last PT Received On 02/08/12  Assistance Needed +1  PT Time Calculation  PT Start Time 1155  PT Stop Time 1220  PT Time Calculation (min) 25 min  Subjective Data  Subjective I feel good  Precautions  Precautions Knee  Precaution Comments no pillow under knee  Restrictions  RLE Weight Bearing WBAT  Cognition  Overall Cognitive Status Appears within functional limits for tasks assessed/performed  Arousal/Alertness Awake/alert  Orientation Level Appears intact for tasks assessed  Behavior During Session [facility] for tasks performed  Transfers  Transfers Sit to Stand;Stand to Sit  Sit to Stand 4: Min guard;From chair/3-in-1;With upper extremity assist;With armrests  Stand to Sit 4: Min guard;To chair/3-in-1  Details for Transfer Assistance cues for hand placcement  Ambulation/Gait  Ambulation/Gait Assistance 4: Min guard  Ambulation Distance (Feet) 100 Feet  Assistive device Rolling walker  Ambulation/Gait Assistance Details cues for step through gait  Exercises  Exercises Total Joint  Total Joint Exercises  Ankle Circles/Pumps AROM;10 reps;Both  Straight Leg Raises AROM;Right;10 reps  Quad Sets AROM;Right;10 reps  Hip ABduction/ADduction AROM;Right;10 reps  PT - End of Session  Activity Tolerance Patient tolerated treatment well  Patient left in chair;with call bell/phone within reach;with family/visitor present  Nurse Communication Other (comment)  PT - Assessment/Plan  Comments on Treatment Session pt requested to sit upin chair until lunch; RN aware; pt flushed before and after amb, was not this morning; family asking as to what would cause this; RN aware and present end of session; BP 170/79   PT Plan Discharge plan remains appropriate;Frequency remains appropriate  PT Frequency 7X/week  Follow Up Recommendations Home health PT  Equipment Recommended None recommended by PT  Acute Rehab PT Goals  Time For Goal Achievement 02/15/12    Potential to Achieve Goals Good  Pt will go Sit to Stand with supervision  Pt will Ambulate 51 - 150 feet;with supervision;with rolling walker  PT Goal: Ambulate - Progress Progressing toward goal  Pt will Perform Home Exercise Program with supervision, verbal cues required/provided  PT Goal: Perform Home Exercise Program - Progress Progressing toward goal

## 2012-02-08 NOTE — Evaluation (Signed)
Physical Therapy Evaluation Patient Details Name: [name] MRN: [id number] DOB: Aug 13, 1934 Today's Date: 02/08/2012 Time: 4098-1191 PT Time Calculation (min): 27 min  PT Assessment / Plan / Recommendation Clinical Impression  pt will benfit from PT to maximize independence for home    PT Assessment  Patient needs continued PT services    Follow Up Recommendations  Home health PT    Barriers to Discharge None      lEquipment Recommendations  None recommended by PT    Recommendations for Other Services     Frequency 7X/week    Precautions / Restrictions Precautions Precautions: Knee Precaution Comments: no pillow under knee Restrictions RLE Weight Bearing: Weight bearing as tolerated   Pertinent Vitals/Pain       Mobility  Bed Mobility Bed Mobility: Supine to Sit Supine to Sit: 5: Supervision Details for Bed Mobility Assistance: cues for scooting laterally Transfers Transfers: Sit to Stand;Stand to Sit Sit to Stand: 4: Min guard;From chair/3-in-1;From bed Stand to Sit: 4: Min guard;To chair/3-in-1 Details for Transfer Assistance: cues for hand placcement Ambulation/Gait Ambulation/Gait Assistance: 4: Min guard Ambulation Distance (Feet): 85 Feet (15' X 2) Assistive device: Rolling walker Ambulation/Gait Assistance Details: cues for WBAT, sequence, RW safety Gait Pattern: Step-to pattern;Step-through pattern    Exercises Total Joint Exercises Ankle Circles/Pumps: AROM;10 reps;Both Straight Leg Raises: AROM;5 reps;Right   PT Diagnosis:    PT Problem List: Decreased strength;Decreased range of motion;Decreased activity tolerance;Decreased knowledge of use of DME;Decreased mobility PT Treatment Interventions: DME instruction;Gait training;Functional mobility training;Therapeutic activities;Therapeutic exercise;Patient/family education   PT Goals Acute Rehab PT Goals PT Goal Formulation: With patient Time For Goal Achievement: 02/15/12 Potential to  Achieve Goals: Good Pt will go Supine/Side to Sit: with modified independence PT Goal: Supine/Side to Sit - Progress: Goal set today Pt will go Sit to Stand: with supervision PT Goal: Sit to Stand - Progress: Goal set today Pt will Ambulate: 51 - 150 feet;with supervision;with rolling walker PT Goal: Ambulate - Progress: Goal set today Pt will Perform Home Exercise Program: with supervision, verbal cues required/provided PT Goal: Perform Home Exercise Program - Progress: Goal set today  Visit Information  Last PT Received On: 02/08/12 Assistance Needed: +1    Subjective Data  Subjective: can i take this oxygen off? Patient Stated Goal: to get better   Prior Functioning  Home Living Lives With: Spouse Available Help at Discharge: Family Type of Home: House Home Layout: Multi-level Alternate Level Stairs-Number of Steps: can enter on ground level and stay there Bathroom Shower/Tub: Health visitor: Handicapped height Home Adaptive Equipment: Bedside commode/3-in-1;Walker - rolling Prior Function Level of Independence: Independent;Independent with assistive device(s) Comments: amb with cane when outside Communication Communication: No difficulties    Cognition  Overall Cognitive Status: Appears within functional limits for tasks assessed/performed Arousal/Alertness: Awake/alert Orientation Level: Appears intact for tasks assessed Behavior During Session: [facility] for tasks performed    Extremity/Trunk Assessment Right Upper Extremity Assessment RUE ROM/Strength/Tone: [facility] for tasks assessed Left Upper Extremity Assessment LUE ROM/Strength/Tone: WFL for tasks assessed Right Lower Extremity Assessment RLE ROM/Strength/Tone: Deficits RLE ROM/Strength/Tone Deficits: ankle WFL;  I SLR; pt only able to flex 15 degrees prior to surgery Left Lower Extremity Assessment LLE ROM/Strength/Tone: [facility] for tasks assessed   Balance    End of Session PT - End of  Session Activity Tolerance: Patient tolerated treatment well Patient left: in chair;with call bell/phone within reach   [facility] 02/08/2012, 9:44 AM

## 2012-02-08 NOTE — Care Management Note (Unsigned)
    Page 1 of 2   02/08/2012     4:48:35 PM   CARE MANAGEMENT NOTE 02/08/2012  Patient:  Valerie, [name]   Account Number:  [id number]  Date Initiated:  02/08/2012  Documentation initiated by:  [name]  Subjective/Objective Assessment:   DX OSTEOARTHRITIS KNEE-RIGHT; TOTAL KNEE REPLACEMNT     Action/Plan:   CM SPOKE WITH PATIENT AND FAMILY. PLANS ARE FOR PATIENT TO RETURN TO HER HOME IN [place] WHERE SPOUSE AND DAUGHTER WILL BE CAREGIVERS. STATES SHE ALREADY HAS RW WHEELCHAIR. THINKS SHE WILL NEED CON'T MOTION MACHINE FOR HOME.   Anticipated DC Date:  02/10/2012   Anticipated DC Plan:  HOME W HOME HEALTH SERVICES      DC Planning Services  CM consult      [facility] Choice  HOME HEALTH   Choice offered to / List presented to:  C-1 Patient        HH arranged  HH-2 PT  HH-1 RN      [facility] agency  [facility]   Status of service:  In process, will continue to follow Medicare Important Message given?  NO (If response is "NO", the following Medicare IM given date fields will be blank) Date Medicare IM given:   Date Additional Medicare IM given:    Discharge Disposition:    Per UR Regulation:  Reviewed for med. necessity/level of care/duration of stay  If discussed at Long Length of Stay Meetings, dates discussed:    Comments:  02/08/2012 [name] BSN CCM [phone] [facility] INTAKE REGARDING FAXING HH ORDERS FOR REQUESTED SERVICES. WAS ADVISED THAT THERE WAS NO AVAILABILITY FOR HHRN BUT HHPT WAS AVAILABLE. [facility] REP WHO CONFIRMED THAT THERE WAS NO AVAILABILITY FOR HHRN. [facility] WAS NOTIFIED AND [name] PROVIDE HHRN AND HHPT. HH ORDERS, FACE SHEET, OP NOTE AND H&P FAXED TO [facility] WITH CONFIRMATION. CATHERINE -INTAKE VERIFIED THAT SERVICES WERE AVAILABLE.  02/08/2012 Valerie Jahn,RN BSN CCM NOTIFIED BY [facility] REP THAT PATIENT WAS REFERRED TO PATIENT BY MD OFFICE. LIST OG HH AGENCIES PLACED ON SHADOW CHRT. WILL AWAIT  ORDERS FOR CPM IF NEEDED.

## 2012-02-09 ENCOUNTER — Encounter (HOSPITAL_COMMUNITY): Payer: Self-pay | Admitting: Orthopedic Surgery

## 2012-02-09 LAB — BASIC METABOLIC PANEL
GFR calc non Af Amer: 56 mL/min — ABNORMAL LOW (ref >90)
Glucose, Bld: 144 mg/dL — ABNORMAL HIGH (ref 70–99)
Potassium: 4.1 mEq/L (ref 3.5–5.1)
Sodium: 137 mEq/L (ref 135–145)

## 2012-02-09 LAB — CBC
Hemoglobin: 10.7 g/dL — ABNORMAL LOW (ref 12.0–15.0)
MCHC: 32.7 g/dL (ref 30.0–36.0)
Platelets: 171 10*3/uL (ref 150–400)
RBC: 3.66 MIL/uL — ABNORMAL LOW (ref 3.87–5.11)

## 2012-02-09 LAB — PROTIME-INR
INR: 1.83 — ABNORMAL HIGH (ref 0.00–1.49)
Prothrombin Time: 21.5 seconds — ABNORMAL HIGH (ref 11.6–15.2)

## 2012-02-09 MED ORDER — HYDROCODONE-ACETAMINOPHEN 7.5-325 MG PO TABS: 1.0000 | ORAL_TABLET | ORAL | Status: AC

## 2012-02-09 MED ORDER — DSS 100 MG PO CAPS: 100.0000 mg | ORAL_CAPSULE | Freq: Two times a day (BID) | ORAL | Status: DC

## 2012-02-09 MED ORDER — DIPHENHYDRAMINE HCL 25 MG PO CAPS: 25.0000 mg | ORAL_CAPSULE | Freq: Four times a day (QID) | ORAL | Status: DC | PRN

## 2012-02-09 MED ORDER — WARFARIN SODIUM 2 MG PO TABS: 1.0000 mg | ORAL_TABLET | ORAL | Status: DC

## 2012-02-09 MED ORDER — ENOXAPARIN SODIUM 40 MG/0.4ML ~~LOC~~ SOLN: 40.0000 mg | SUBCUTANEOUS | Status: DC

## 2012-02-09 MED ORDER — FERROUS SULFATE 325 (65 FE) MG PO TABS: 325.0000 mg | ORAL_TABLET | Freq: Three times a day (TID) | ORAL | Status: DC

## 2012-02-09 MED ORDER — POLYETHYLENE GLYCOL 3350 17 G PO PACK: 17.0000 g | PACK | Freq: Two times a day (BID) | ORAL | Status: DC

## 2012-02-09 MED ORDER — METHOCARBAMOL 500 MG PO TABS: 500.0000 mg | ORAL_TABLET | Freq: Four times a day (QID) | ORAL | Status: AC | PRN

## 2012-02-09 NOTE — Progress Notes (Signed)
CARE MANAGEMENT NOTE 02/09/2012  Patient:  [name], [name]   Account Number:  [id number]  Date Initiated:  02/08/2012  Documentation initiated by:  Colleen Can  Subjective/Objective Assessment:   DX OSTEOARTHRITIS KNEE-RIGHT; TOTAL KNEE REPLACEMNT     Action/Plan:   CM SPOKE WITH PATIENT AND FAMILY. PLANS ARE FOR PATIENT TO RETURN TO HER HOME IN [place] WHERE SPOUSE AND DAUGHTER WILL BE CAREGIVERS. STATES SHE ALREADY HAS RW WHEELCHAIR. THINKS SHE WILL NEED CON'T MOTION MACHINE FOR HOME.   Anticipated DC Date:  02/10/2012   Anticipated DC Plan:  HOME W HOME HEALTH SERVICES  In-house referral  NA      DC Planning Services  CM consult      [facility] Choice  HOME HEALTH   Choice offered to / List presented to:  C-1 Patient   DME arranged  CPM      DME agency  [employer]     HH arranged  HH-2 PT  HH-1 RN      [facility] agency  [facility]   Status of service:  Completed, signed off Medicare Important Message given?  NO (If response is "NO", the following Medicare IM given date fields will be blank) Date Medicare IM given:   Date Additional Medicare IM given:    Discharge Disposition:  HOME W HOME HEALTH SERVICES  Per UR Regulation:  Reviewed for med. necessity/level of care/duration of stay   Comments:  02/09/2012 Raynelle Bring BSN CCM [phone] Pt will discarge today. Orders for CPM- orders faxed to [employer]. Confirmation of request received. They will deliver to patient's home. Patient has already received call from [employer] and will let them know when she gets home.[facility] advised that patient is for discharge today. Discharge summary faxed at request to [facility] office. Services will start tomorrow.

## 2012-02-09 NOTE — Progress Notes (Signed)
Discharge summary sent to payer through [facility]  

## 2012-02-09 NOTE — Progress Notes (Signed)
ANTICOAGULATION CONSULT NOTE - Follow Up Consult  Pharmacy Consult for Coumadin Indication: Hx Afib, post-op VTE prophylaxis  Allergies  Allergen Reactions  . Codeine Nausea And Vomiting  . Zinc     nausea   Patient Measurements: Height: 5\' 1"  (154.9 cm) Weight: 146 lb (66.225 kg) IBW/kg (Calculated) : 47.8   Vital Signs: Temp: 97.4 F (36.3 C) (06/05 0627) Temp src: Oral (06/05 0627) BP: 163/79 mmHg (06/05 0627) Pulse Rate: 64  (06/05 0627)  Labs:  Basename 02/09/12 0410 02/08/12 0436 02/07/12 0856  HGB 10.7* 11.1* --  HCT 32.7* 34.3* --  PLT 171 167 --  APTT -- -- --  LABPROT 21.5* 15.7* 15.0  INR 1.83* 1.22 1.16  HEPARINUNFRC -- -- --  CREATININE 0.97 0.97 --  CKTOTAL -- -- --  CKMB -- -- --  TROPONINI -- -- --   Estimated Creatinine Clearance: 43.7 ml/min (by C-G formula based on Cr of 0.97).  Medications:  Scheduled:     . amiodarone  200 mg Oral Daily  . atenolol  100 mg Oral BID  . atorvastatin  20 mg Oral Custom  . dexamethasone  10 mg Intravenous Once  . docusate sodium  100 mg Oral BID  . enoxaparin  40 mg Subcutaneous Q24H  . famotidine  10 mg Oral Daily  . ferrous sulfate  325 mg Oral TID PC  . furosemide  20 mg Oral Custom  . losartan  100 mg Oral Daily   And  . hydrochlorothiazide  25 mg Oral Daily  . HYDROcodone-acetaminophen  1-2 tablet Oral Q4H  . levothyroxine  50 mcg Oral QAC breakfast  . omega-3 acid ethyl esters  1 g Oral Daily  . polyethylene glycol  17 g Oral BID  . potassium chloride SA  20 mEq Oral TID  . pregabalin  75 mg Oral BID  . sertraline  100 mg Oral Daily  . warfarin  3 mg Oral ONCE-1800  . Warfarin - Pharmacist Dosing Inpatient   Does not apply q1800   Infusions:     . sodium chloride 0.9 % 1,000 mL with potassium chloride 10 mEq infusion 100 mL/hr at 02/08/12 0043    Assessment:  77 YOF s/p right TKA on 6/3  On chronic coumadin for Afib, chronic amiodarone also  Home warfarin dose is 2mg  daily except 1mg   Tues/Thurs, last dose 5/28 in anticipation of surgery (INR 2.18)  Warfarin resumed 6/3 post-op.   INR 1.83 after 3,3mg  Warfarin  Goal of Therapy:  INR 2-3    Plan:   Discharge home today, would recommend 2mg  Warfarin today, continue with home dosage regimen.  As ordered by ortho: Lovenox 40mg  q24h, d/c when INR >/= 1.8 per MD. Can discontinue Lovenox  Daily PT/INR  Otho Bellows, PharmD, Pager: [phone] 02/09/2012 11:42 AM

## 2012-02-09 NOTE — Discharge Summary (Signed)
Physician Discharge Summary  Patient ID: [name] MRN: [id number] DOB/AGE: 02-02-1934 78 y.o.  Admit date: 02/07/2012 Discharge date: 02/09/2012  Procedures:  Procedure(s) (LRB): TOTAL KNEE ARTHROPLASTY (Right)  Attending Physician:  Dr. Durene Romans   Admission Diagnoses:  Right knee OA and pain   Discharge Diagnoses:  Principal Problem:  *S/P right TKA PAF (paroxysmal atrial fibrillation) - controlled with amiodarone, on coumadin   Aortic root dilatation   History of uterine cancer   HTN (hypertension)   Hyperlipemia   Hypothyroidism  Moderate aortic stenosis   Chronic anticoagulation   Tachycardia-bradycardia syndrome   GERD (gastroesophageal reflux disease)   Chronic kidney disease   PONV (postoperative nausea and vomiting)   Pacemaker   Arthritis   History of nephrectomy - left - due to kidney cancer   Neuropathy - feet/legs   Restless leg syndrome   Headache - hx migraines - takes Zoloft for migraines   Cancer - hx Kidney cancer / hx of endometrial cancer    HPI: Pt is a 78 y.o. female complaining of right knee pain for 2+ years. Pain had continually increased since the beginning. X-rays in the clinic show end-stage arthritic changes of the right knee. Pt has tried various conservative treatments which have failed to alleviate their symptoms, including knee brace and steroid injections. Various options are discussed with the patient. Risks, benefits and expectations were discussed with the patient. Patient understand the risks, benefits and expectations and wishes to proceed with surgery.   PCP: Nadean Corwin, MD, MD   Discharged Condition: good  Hospital Course:  Patient underwent the above stated procedure on 02/07/2012. Patient tolerated the procedure well and brought to the recovery room in good condition and subsequently to the floor.  POD #1 BP: 147/66 ; Pulse: 62 ; Temp: 98 F (36.7 C) ; Resp: 16 Pt's foley was removed, as well as the hemovac  drain removed. IV was changed to a saline lock. Patient reports pain as mild, pain well controlled. No events throughout the night. Neurovascular intact, dorsiflexion/plantar flexion intact, incision: dressing C/D/I, no cellulitis present and compartment soft.   LABS  Basename  02/08/12 0436   HGB  11.1  HCT  34.3   POD #2  BP: 163/79 ; Pulse: 64 ; Temp: 97.4 F (36.3 C) ; Resp: 16  Patient reports pain as mild, pain well controlled. No events throughout the night. She is happy with her progress. Ready to be discharged home. Neurovascular intact, dorsiflexion/plantar flexion intact, incision: dressing C/D/I, no cellulitis present and compartment soft.   LABS  Basename  02/09/12 0410   HGB  10.7  HCT  32.7    Discharge Exam: General appearance: alert, cooperative and no distress Extremities: Homans sign is negative, no sign of DVT, no edema, redness or tenderness in the calves or thighs and no ulcers, gangrene or trophic changes  Disposition:  Home  with follow up in 2 weeks  Follow-up Information    Follow up with OLIN,Iokepa Geffre D in 2 weeks.   Contact information:   [facility] [address], [address] [place] [address] [phone]          Discharge Orders    Future Appointments: Provider: Department: Dept Phone: Center:   03/30/2012 8:50 AM [facility] [phone] [facility]   04/26/2012 8:30 AM Cassell Clement, MD [facility] [phone] None     Future Orders Please Complete By Expires   Diet - low sodium heart healthy  Call MD / Call 911      Comments:   If you experience chest pain or shortness of breath, CALL 911 and be transported to the hospital emergency room.  If you develope a fever above 101 F, pus (white drainage) or increased drainage or redness at the wound, or calf pain, call your surgeon's office.   Discharge instructions      Comments:   Maintain surgical dressing  for 8 days, then replace with gauze and tape. Keep the area dry and clean until follow up. Follow up in 2 weeks at [facility]. Call with any questions or concerns.  Coumadin to be monitored by home health nurse and pharmacy to obtain and maintain INR 2-3  CPM machine 0-20 as tolerated, DO NOT advance, 4 hours per day  PT for ambulation, passive stretching and limited ROM (0-20) [very limited flexion do to 62 years not moving the knee, not expecting to go further than 20]    Constipation Prevention      Comments:   Drink plenty of fluids.  Prune juice may be helpful.  You may use a stool softener, such as Colace (over the counter) 100 mg twice a day.  Use MiraLax (over the counter) for constipation as needed.   Increase activity slowly as tolerated      Driving restrictions      Comments:   No driving for 4 weeks   TED hose      Comments:   Use stockings (TED hose) for 2 weeks on both leg(s).  You may remove them at night for sleeping.   Change dressing      Comments:   Maintain surgical dressing for 8 days, then change the dressing daily with sterile 4 x 4 inch gauze dressing and tape. Keep the area dry and clean.      Current Discharge Medication List    START taking these medications   Details  diphenhydrAMINE (BENADRYL) 25 mg capsule Take 1 capsule (25 mg total) by mouth every 6 (six) hours as needed for itching, allergies or sleep. Qty: 30 capsule    docusate sodium 100 MG CAPS Take 100 mg by mouth 2 (two) times daily. Qty: 10 capsule    enoxaparin (LOVENOX) 40 MG/0.4ML injection Inject 0.4 mLs (40 mg total) into the skin daily. Qty: 7 Syringe, Refills: 0   Comments: Use with Coumadin to obtain therapeutic INR of 2-3    ferrous sulfate 325 (65 FE) MG tablet Take 1 tablet (325 mg total) by mouth 3 (three) times daily after meals.    HYDROcodone-acetaminophen (NORCO) 7.5-325 MG per tablet Take 1-2 tablets by mouth every 4 (four) hours. Qty: 120 tablet, Refills:  0    methocarbamol (ROBAXIN) 500 MG tablet Take 1 tablet (500 mg total) by mouth every 6 (six) hours as needed.    polyethylene glycol (MIRALAX / GLYCOLAX) packet Take 17 g by mouth 2 (two) times daily.      CONTINUE these medications which have CHANGED   Details  warfarin (COUMADIN) 2 MG tablet Take 0.5-1.5 tablets (1-3 mg total) by mouth See admin instructions. Qty: 5 tablet, Refills: 0   Comments: To be monitored and adjusted by [facility] to obtain and maintain INR 2-3      CONTINUE these medications which have NOT CHANGED   Details  amiodarone (PACERONE) 200 MG tablet Take 200 mg by mouth daily.     atenolol (TENORMIN) 100 MG tablet Take 100 mg by mouth 2 (two)  times daily. 1 tab po bid    atorvastatin (LIPITOR) 40 MG tablet Take 20 mg by mouth See admin instructions. Takes only Monday-Friday    Famotidine (PEPCID PO) Take by mouth daily.     furosemide (LASIX) 40 MG tablet Take 20 mg by mouth See admin instructions. 1/2 tab 3 x a week    levothyroxine (SYNTHROID, LEVOTHROID) 100 MCG tablet 1/2 tab po qd    losartan-hydrochlorothiazide (HYZAAR) 100-25 MG per tablet Take 1 tablet by mouth daily.     omega-3 acid ethyl esters (LOVAZA) 1 G capsule Take 1 g by mouth daily.    potassium chloride SA (K-DUR,KLOR-CON) 20 MEQ tablet Take 1 tablet (20 mEq total) by mouth 3 (three) times daily. Qty: 270 tablet, Refills: 3   Associated Diagnoses: Hypokalemia    pregabalin (LYRICA) 75 MG capsule Take 75 mg by mouth 2 (two) times daily.     sertraline (ZOLOFT) 100 MG tablet Take 100 mg by mouth daily.     Ascorbic Acid (VITAMIN C PO) Take 500 mg by mouth daily.     calcium-vitamin D (OSCAL WITH D) 500-200 MG-UNIT per tablet Take 1 tablet by mouth daily.    Cholecalciferol (VITAMIN D-3) 5000 UNITS TABS 1 tab po qd     Cyanocobalamin (B-12 PO) Take 1,000 mg by mouth daily.     Hyoscyamine Sulfate (HYOMAX-DT) 0.375 MG TBCR Take one tab twice a day as needed for abdominal  pain Qty: 25 each, Refills: 1   Associated Diagnoses: Diarrhea    MAGNESIUM PO Take 30 mg by mouth 3 (three) times a week.     nitroGLYCERIN (NITRODUR - DOSED IN MG/24 HR) 0.4 mg/hr Place 1 patch onto the skin as needed. CHEST PAINS   Associated Diagnoses: Chest pain    nitroGLYCERIN (NITROSTAT) 0.4 MG SL tablet Place 0.4 mg under the tongue every 5 (five) minutes as needed. CHEST PAINS         Signed: Anastasio Auerbach. Liliana Dang   PAC  02/09/2012, 10:17 AM

## 2012-02-09 NOTE — Progress Notes (Signed)
Physical Therapy Treatment Patient Details Name: BREHANNA DEVENY MRN: [id number] DOB: [birth year] Today's Date: 02/09/2012 Time: 0940-1007 PT Time Calculation (min): 27 min  PT Assessment / Plan / Recommendation Comments on Treatment Session  pt progressing well    Follow Up Recommendations  Home health PT    Barriers to Discharge        Equipment Recommendations  None recommended by PT    Recommendations for Other Services    Frequency 7X/week   Plan Discharge plan remains appropriate;Frequency remains appropriate    Precautions / Restrictions Precautions Precautions: Knee Restrictions Weight Bearing Restrictions: No RLE Weight Bearing: Weight bearing as tolerated Other Position/Activity Restrictions: No ROM, pt unable to flex knee prior to surgery( since age of  5)   Pertinent Vitals/Pain     Mobility  Bed Mobility Bed Mobility: Sit to Supine Sit to Supine: 5: Supervision Details for Bed Mobility Assistance: cues for safety Transfers Transfers: Sit to Stand;Stand to Sit Sit to Stand: 5: Supervision;From chair/3-in-1 Stand to Sit: 5: Supervision;To bed;To chair/3-in-1 Details for Transfer Assistance: cues for hand placcement Ambulation/Gait Ambulation/Gait Assistance: 5: Supervision Ambulation Distance (Feet): 75 Feet Assistive device: Rolling walker Gait Pattern: Step-to pattern;Step-through pattern Stairs: Yes Stairs Assistance: 4: Min assist Stairs Assistance Details (indicate cue type and reason): cues for sequence and to slow down Stair Management Technique: One rail Right Number of Stairs: 3     Exercises     PT Diagnosis:    PT Problem List:   PT Treatment Interventions:     PT Goals Acute Rehab PT Goals Time For Goal Achievement: 02/15/12 Potential to Achieve Goals: Good Pt will go Sit to Stand: with supervision PT Goal: Sit to Stand - Progress: Met Pt will Ambulate: 51 - 150 feet;with supervision;with rolling walker PT Goal: Ambulate -  Progress: Met  Visit Information  Last PT Received On: 02/09/12 Assistance Needed: +1    Subjective Data  Subjective: I'm ready   Cognition  Overall Cognitive Status: Appears within functional limits for tasks assessed/performed Arousal/Alertness: Awake/alert Orientation Level: Appears intact for tasks assessed Behavior During Session: [facility] for tasks performed    Balance     End of Session PT - End of Session Activity Tolerance: Patient tolerated treatment well Patient left: in bed;with call bell/phone within reach;with family/visitor present Nurse Communication: Other (comment) (ready for D/C) CPM Right Knee CPM Right Knee: Off    [facility] 02/09/2012, 10:15 AM

## 2012-02-09 NOTE — Progress Notes (Signed)
Discharge Information for Home Health Agency:  INR/Prothrombin Time  Lab Results  Component Value Date   INR 1.83* 02/09/2012   INR 1.22 02/08/2012   INR 1.16 02/07/2012  Baseline INR 5/29: 2.18  Coumadin at [facility]  6/4: Coumadin 3mg  6/5  Coumadin 3mg   Home dose Warfarin: 2mg  daily except 1mg  Tuesday, Thursday. Last dose prior to admission 5/28.  MD note stated that Lovenox could be discontinued with INR of >/= 1.8. No need to continue Lovenox.  Patient counseled to take 2mg  Coumadin today.  Plan next PT/INR 6/6 per [facility].  Thank you Otho Bellows PharmD 2:33 PM 02/09/2012

## 2012-02-09 NOTE — Progress Notes (Signed)
Subjective: 2 Days Post-Op Procedure(s) (LRB): TOTAL KNEE ARTHROPLASTY (Right)   Patient reports pain as mild, pain well controlled. No events throughout the night. She is happy with her progress. Ready to be discharged home.  Objective:   VITALS:   Filed Vitals:   02/09/12 0627  BP: 163/79  Pulse: 64  Temp: 97.4 F (36.3 C)  Resp: 16    Neurovascular intact Dorsiflexion/Plantar flexion intact Incision: dressing C/D/I No cellulitis present Compartment soft  LABS  Basename 02/09/12 0410 02/08/12 0436  HGB 10.7* 11.1*  HCT 32.7* 34.3*  WBC 9.6 13.0*  PLT 171 167     Basename 02/09/12 0410 02/08/12 0436  NA 137 139  K 4.1 3.7  BUN 17 13  CREATININE 0.97 0.97  GLUCOSE 144* 144*     Assessment/Plan: 2 Days Post-Op Procedure(s) (LRB): TOTAL KNEE ARTHROPLASTY (Right)   Up with therapy Discharge home with home health Follow up in 2 weeks at [facility].  Follow-up Information    Follow up with OLIN,Caycee Wanat D in 2 weeks.   Contact information:   [facility] [address] [place] [address] [phone]          Anastasio Auerbach. Jasun Gasparini   PAC  02/09/2012, 9:23 AM

## 2012-02-09 NOTE — Plan of Care (Signed)
Problem: Consults Goal: Diagnosis- Total Joint Replacement Outcome: Completed/Met Date Met:  02/09/12 Primary Total Knee     

## 2012-02-10 ENCOUNTER — Emergency Department (HOSPITAL_COMMUNITY): Payer: 59

## 2012-02-10 ENCOUNTER — Inpatient Hospital Stay (HOSPITAL_COMMUNITY)
Admission: EM | Admit: 2012-02-10 | Discharge: 2012-02-12 | DRG: 291 | Disposition: A | Discharge status: Home Health | Payer: 59 | Attending: Internal Medicine | Admitting: Internal Medicine

## 2012-02-10 ENCOUNTER — Encounter (HOSPITAL_COMMUNITY): Payer: Self-pay | Admitting: *Deleted

## 2012-02-10 DIAGNOSIS — J9601 Acute respiratory failure with hypoxia: Secondary | ICD-10-CM

## 2012-02-10 DIAGNOSIS — I129 Hypertensive chronic kidney disease with stage 1 through stage 4 chronic kidney disease, or unspecified chronic kidney disease: Secondary | ICD-10-CM | POA: Diagnosis present

## 2012-02-10 DIAGNOSIS — I5033 Acute on chronic diastolic (congestive) heart failure: Principal | ICD-10-CM | POA: Diagnosis present

## 2012-02-10 DIAGNOSIS — Z96659 Presence of unspecified artificial knee joint: Secondary | ICD-10-CM

## 2012-02-10 DIAGNOSIS — Z79899 Other long term (current) drug therapy: Secondary | ICD-10-CM

## 2012-02-10 DIAGNOSIS — G2581 Restless legs syndrome: Secondary | ICD-10-CM

## 2012-02-10 DIAGNOSIS — E785 Hyperlipidemia, unspecified: Secondary | ICD-10-CM

## 2012-02-10 DIAGNOSIS — Z800 Family history of malignant neoplasm of digestive organs: Secondary | ICD-10-CM

## 2012-02-10 DIAGNOSIS — F329 Major depressive disorder, single episode, unspecified: Secondary | ICD-10-CM

## 2012-02-10 DIAGNOSIS — G589 Mononeuropathy, unspecified: Secondary | ICD-10-CM | POA: Diagnosis present

## 2012-02-10 DIAGNOSIS — Z8774 Personal history of (corrected) congenital malformations of heart and circulatory system: Secondary | ICD-10-CM

## 2012-02-10 DIAGNOSIS — D131 Benign neoplasm of stomach: Secondary | ICD-10-CM

## 2012-02-10 DIAGNOSIS — J209 Acute bronchitis, unspecified: Secondary | ICD-10-CM | POA: Diagnosis present

## 2012-02-10 DIAGNOSIS — R197 Diarrhea, unspecified: Secondary | ICD-10-CM

## 2012-02-10 DIAGNOSIS — I5023 Acute on chronic systolic (congestive) heart failure: Secondary | ICD-10-CM

## 2012-02-10 DIAGNOSIS — M869 Osteomyelitis, unspecified: Secondary | ICD-10-CM

## 2012-02-10 DIAGNOSIS — R0602 Shortness of breath: Secondary | ICD-10-CM

## 2012-02-10 DIAGNOSIS — M1990 Unspecified osteoarthritis, unspecified site: Secondary | ICD-10-CM

## 2012-02-10 DIAGNOSIS — Z85528 Personal history of other malignant neoplasm of kidney: Secondary | ICD-10-CM

## 2012-02-10 DIAGNOSIS — I4891 Unspecified atrial fibrillation: Secondary | ICD-10-CM

## 2012-02-10 DIAGNOSIS — J9600 Acute respiratory failure, unspecified whether with hypoxia or hypercapnia: Secondary | ICD-10-CM

## 2012-02-10 DIAGNOSIS — N189 Chronic kidney disease, unspecified: Secondary | ICD-10-CM | POA: Diagnosis present

## 2012-02-10 DIAGNOSIS — I10 Essential (primary) hypertension: Secondary | ICD-10-CM

## 2012-02-10 DIAGNOSIS — D1399 Benign neoplasm of ill-defined sites within the digestive system: Secondary | ICD-10-CM

## 2012-02-10 DIAGNOSIS — Z7901 Long term (current) use of anticoagulants: Secondary | ICD-10-CM

## 2012-02-10 DIAGNOSIS — I350 Nonrheumatic aortic (valve) stenosis: Secondary | ICD-10-CM

## 2012-02-10 DIAGNOSIS — I509 Heart failure, unspecified: Secondary | ICD-10-CM

## 2012-02-10 DIAGNOSIS — C649 Malignant neoplasm of unspecified kidney, except renal pelvis: Secondary | ICD-10-CM

## 2012-02-10 DIAGNOSIS — R509 Fever, unspecified: Secondary | ICD-10-CM

## 2012-02-10 DIAGNOSIS — F3289 Other specified depressive episodes: Secondary | ICD-10-CM

## 2012-02-10 DIAGNOSIS — Z9189 Other specified personal risk factors, not elsewhere classified: Secondary | ICD-10-CM

## 2012-02-10 DIAGNOSIS — I359 Nonrheumatic aortic valve disorder, unspecified: Secondary | ICD-10-CM | POA: Diagnosis present

## 2012-02-10 DIAGNOSIS — I495 Sick sinus syndrome: Secondary | ICD-10-CM

## 2012-02-10 DIAGNOSIS — K520 Gastroenteritis and colitis due to radiation: Secondary | ICD-10-CM

## 2012-02-10 DIAGNOSIS — F411 Generalized anxiety disorder: Secondary | ICD-10-CM

## 2012-02-10 DIAGNOSIS — K219 Gastro-esophageal reflux disease without esophagitis: Secondary | ICD-10-CM | POA: Diagnosis present

## 2012-02-10 DIAGNOSIS — E876 Hypokalemia: Secondary | ICD-10-CM

## 2012-02-10 DIAGNOSIS — I2510 Atherosclerotic heart disease of native coronary artery without angina pectoris: Secondary | ICD-10-CM | POA: Diagnosis present

## 2012-02-10 DIAGNOSIS — E039 Hypothyroidism, unspecified: Secondary | ICD-10-CM

## 2012-02-10 DIAGNOSIS — D139 Benign neoplasm of ill-defined sites within the digestive system: Secondary | ICD-10-CM

## 2012-02-10 DIAGNOSIS — Z950 Presence of cardiac pacemaker: Secondary | ICD-10-CM

## 2012-02-10 DIAGNOSIS — K3184 Gastroparesis: Secondary | ICD-10-CM

## 2012-02-10 DIAGNOSIS — Z8542 Personal history of malignant neoplasm of other parts of uterus: Secondary | ICD-10-CM

## 2012-02-10 DIAGNOSIS — N6019 Diffuse cystic mastopathy of unspecified breast: Secondary | ICD-10-CM

## 2012-02-10 LAB — DIFFERENTIAL
Basophils Relative: 0 % (ref 0–1)
Monocytes Relative: 7 % (ref 3–12)
Neutro Abs: 10.1 10*3/uL — ABNORMAL HIGH (ref 1.7–7.7)
Neutrophils Relative %: 79 % — ABNORMAL HIGH (ref 43–77)

## 2012-02-10 LAB — COMPREHENSIVE METABOLIC PANEL
ALT: 27 U/L (ref 0–35)
AST: 30 U/L (ref 0–37)
Albumin: 3 g/dL — ABNORMAL LOW (ref 3.5–5.2)
Alkaline Phosphatase: 106 U/L (ref 39–117)
BUN: 20 mg/dL (ref 6–23)
Chloride: 103 mEq/L (ref 96–112)
Potassium: 3.5 mEq/L (ref 3.5–5.1)
Sodium: 140 mEq/L (ref 135–145)
Total Bilirubin: 0.6 mg/dL (ref 0.3–1.2)

## 2012-02-10 LAB — CBC
Hemoglobin: 10.9 g/dL — ABNORMAL LOW (ref 12.0–15.0)
MCHC: 32.2 g/dL (ref 30.0–36.0)
Platelets: 165 10*3/uL (ref 150–400)
RBC: 3.78 MIL/uL — ABNORMAL LOW (ref 3.87–5.11)

## 2012-02-10 LAB — LIPASE, BLOOD: Lipase: 18 U/L (ref 11–59)

## 2012-02-10 LAB — CARDIAC PANEL(CRET KIN+CKTOT+MB+TROPI)
Relative Index: 1.2 (ref 0.0–2.5)
Troponin I: <0.3 ng/mL (ref <0.30)

## 2012-02-10 MED ORDER — ENOXAPARIN SODIUM 40 MG/0.4ML ~~LOC~~ SOLN: 40.0000 mg | SUBCUTANEOUS | Status: DC

## 2012-02-10 MED ORDER — LEVOTHYROXINE SODIUM 50 MCG PO TABS
50.0000 ug | ORAL_TABLET | Freq: Every day | ORAL | Status: DC
  Administered 2012-02-11 – 2012-02-12 (×2): 50 ug via ORAL
  Filled 2012-02-10 (×4): qty 1

## 2012-02-10 MED ORDER — ALBUTEROL SULFATE (5 MG/ML) 0.5% IN NEBU
2.5000 mg | INHALATION_SOLUTION | Freq: Four times a day (QID) | RESPIRATORY_TRACT | Status: DC
  Administered 2012-02-10: 2.5 mg via RESPIRATORY_TRACT
  Filled 2012-02-10: qty 0.5

## 2012-02-10 MED ORDER — LEVOFLOXACIN IN D5W 750 MG/150ML IV SOLN: 750.0000 mg | INTRAVENOUS | Status: DC

## 2012-02-10 MED ORDER — ACETAMINOPHEN 325 MG PO TABS
650.0000 mg | ORAL_TABLET | Freq: Once | ORAL | Status: AC
  Administered 2012-02-10: 650 mg via ORAL
  Filled 2012-02-10: qty 2

## 2012-02-10 MED ORDER — SERTRALINE HCL 100 MG PO TABS
100.0000 mg | ORAL_TABLET | Freq: Every evening | ORAL | Status: DC
  Administered 2012-02-10 – 2012-02-11 (×2): 100 mg via ORAL
  Filled 2012-02-10 (×3): qty 1

## 2012-02-10 MED ORDER — IOHEXOL 300 MG/ML  SOLN
100.0000 mL | Freq: Once | INTRAMUSCULAR | Status: AC | PRN
  Administered 2012-02-10: 100 mL via INTRAVENOUS

## 2012-02-10 MED ORDER — HYDROCODONE-ACETAMINOPHEN 7.5-325 MG PO TABS
1.0000 | ORAL_TABLET | ORAL | Status: DC
  Administered 2012-02-10 – 2012-02-11 (×3): 1 via ORAL
  Administered 2012-02-11: 2 via ORAL
  Administered 2012-02-11 (×2): 1 via ORAL
  Administered 2012-02-11 – 2012-02-12 (×4): 2 via ORAL
  Filled 2012-02-10 (×2): qty 2
  Filled 2012-02-10 (×4): qty 1
  Filled 2012-02-10 (×3): qty 2
  Filled 2012-02-10 (×2): qty 1

## 2012-02-10 MED ORDER — ACETAMINOPHEN 325 MG PO TABS: 650.0000 mg | ORAL_TABLET | Freq: Four times a day (QID) | ORAL | Status: DC | PRN

## 2012-02-10 MED ORDER — AMIODARONE HCL 200 MG PO TABS
200.0000 mg | ORAL_TABLET | Freq: Every evening | ORAL | Status: DC
  Administered 2012-02-10 – 2012-02-11 (×2): 200 mg via ORAL
  Filled 2012-02-10 (×3): qty 1

## 2012-02-10 MED ORDER — GUAIFENESIN-DM 100-10 MG/5ML PO SYRP: 5.0000 mL | ORAL_SOLUTION | ORAL | Status: DC | PRN

## 2012-02-10 MED ORDER — AMOXICILLIN-POT CLAVULANATE 500-125 MG PO TABS
1.0000 | ORAL_TABLET | Freq: Two times a day (BID) | ORAL | Status: DC
  Administered 2012-02-10 – 2012-02-12 (×4): 500 mg via ORAL
  Filled 2012-02-10 (×5): qty 1

## 2012-02-10 MED ORDER — DSS 100 MG PO CAPS: 100.0000 mg | ORAL_CAPSULE | Freq: Two times a day (BID) | ORAL | Status: DC

## 2012-02-10 MED ORDER — FUROSEMIDE 10 MG/ML IJ SOLN
40.0000 mg | Freq: Every day | INTRAMUSCULAR | Status: DC
  Administered 2012-02-10 – 2012-02-12 (×3): 40 mg via INTRAVENOUS
  Filled 2012-02-10 (×3): qty 4

## 2012-02-10 MED ORDER — SODIUM CHLORIDE 0.9 % IJ SOLN
3.0000 mL | Freq: Two times a day (BID) | INTRAMUSCULAR | Status: DC
  Administered 2012-02-10 – 2012-02-11 (×3): 3 mL via INTRAVENOUS
  Administered 2012-02-12: 10:00:00 via INTRAVENOUS

## 2012-02-10 MED ORDER — PREGABALIN 75 MG PO CAPS
75.0000 mg | ORAL_CAPSULE | Freq: Two times a day (BID) | ORAL | Status: DC
  Administered 2012-02-10 – 2012-02-12 (×4): 75 mg via ORAL
  Filled 2012-02-10 (×4): qty 1

## 2012-02-10 MED ORDER — IPRATROPIUM BROMIDE 0.02 % IN SOLN
0.5000 mg | Freq: Four times a day (QID) | RESPIRATORY_TRACT | Status: DC
  Administered 2012-02-10: 0.5 mg via RESPIRATORY_TRACT
  Filled 2012-02-10: qty 2.5

## 2012-02-10 MED ORDER — ACETAMINOPHEN 650 MG RE SUPP: 650.0000 mg | Freq: Four times a day (QID) | RECTAL | Status: DC | PRN

## 2012-02-10 MED ORDER — POTASSIUM CHLORIDE CRYS ER 20 MEQ PO TBCR
20.0000 meq | EXTENDED_RELEASE_TABLET | Freq: Three times a day (TID) | ORAL | Status: DC
  Administered 2012-02-10 (×2): 20 meq via ORAL
  Filled 2012-02-10 (×5): qty 1

## 2012-02-10 MED ORDER — SODIUM CHLORIDE 0.9 % IV BOLUS (SEPSIS)
500.0000 mL | Freq: Once | INTRAVENOUS | Status: AC
  Administered 2012-02-10: 500 mL via INTRAVENOUS

## 2012-02-10 MED ORDER — HYDROCHLOROTHIAZIDE 25 MG PO TABS
25.0000 mg | ORAL_TABLET | Freq: Every day | ORAL | Status: DC
  Administered 2012-02-11 – 2012-02-12 (×2): 25 mg via ORAL
  Filled 2012-02-10 (×2): qty 1

## 2012-02-10 MED ORDER — HYDROMORPHONE HCL PF 1 MG/ML IJ SOLN: 1.0000 mg | INTRAMUSCULAR | Status: DC | PRN

## 2012-02-10 MED ORDER — METHOCARBAMOL 500 MG PO TABS: 500.0000 mg | ORAL_TABLET | Freq: Four times a day (QID) | ORAL | Status: DC | PRN

## 2012-02-10 MED ORDER — OMEGA-3-ACID ETHYL ESTERS 1 G PO CAPS
1.0000 g | ORAL_CAPSULE | Freq: Every day | ORAL | Status: DC
  Administered 2012-02-11 – 2012-02-12 (×2): 1 g via ORAL
  Filled 2012-02-10 (×3): qty 1

## 2012-02-10 MED ORDER — FERROUS SULFATE 325 (65 FE) MG PO TABS
325.0000 mg | ORAL_TABLET | Freq: Three times a day (TID) | ORAL | Status: DC
  Administered 2012-02-10 – 2012-02-12 (×5): 325 mg via ORAL
  Filled 2012-02-10 (×8): qty 1

## 2012-02-10 MED ORDER — LOSARTAN POTASSIUM 50 MG PO TABS
100.0000 mg | ORAL_TABLET | Freq: Every day | ORAL | Status: DC
  Administered 2012-02-11 – 2012-02-12 (×2): 100 mg via ORAL
  Filled 2012-02-10 (×2): qty 2

## 2012-02-10 MED ORDER — CALCIUM CARBONATE-VITAMIN D 500-200 MG-UNIT PO TABS
1.0000 | ORAL_TABLET | Freq: Every day | ORAL | Status: DC
  Administered 2012-02-11 – 2012-02-12 (×2): 1 via ORAL
  Filled 2012-02-10 (×2): qty 1

## 2012-02-10 MED ORDER — ATENOLOL 100 MG PO TABS
100.0000 mg | ORAL_TABLET | Freq: Two times a day (BID) | ORAL | Status: DC
  Administered 2012-02-10 – 2012-02-12 (×4): 100 mg via ORAL
  Filled 2012-02-10 (×5): qty 1

## 2012-02-10 MED ORDER — LOSARTAN POTASSIUM-HCTZ 100-25 MG PO TABS: 1.0000 | ORAL_TABLET | Freq: Every day | ORAL | Status: DC

## 2012-02-10 MED ORDER — ALUM & MAG HYDROXIDE-SIMETH 200-200-20 MG/5ML PO SUSP: 30.0000 mL | Freq: Four times a day (QID) | ORAL | Status: DC | PRN

## 2012-02-10 MED ORDER — DOCUSATE SODIUM 100 MG PO CAPS
100.0000 mg | ORAL_CAPSULE | Freq: Two times a day (BID) | ORAL | Status: DC
  Administered 2012-02-11 – 2012-02-12 (×3): 100 mg via ORAL
  Filled 2012-02-10 (×5): qty 1

## 2012-02-10 MED ORDER — ONDANSETRON HCL 4 MG PO TABS: 4.0000 mg | ORAL_TABLET | Freq: Four times a day (QID) | ORAL | Status: DC | PRN

## 2012-02-10 MED ORDER — DIPHENHYDRAMINE HCL 25 MG PO CAPS: 25.0000 mg | ORAL_CAPSULE | Freq: Four times a day (QID) | ORAL | Status: DC | PRN

## 2012-02-10 MED ORDER — WARFARIN SODIUM 1 MG PO TABS
1.0000 mg | ORAL_TABLET | Freq: Once | ORAL | Status: AC
  Administered 2012-02-10: 1 mg via ORAL
  Filled 2012-02-10: qty 1

## 2012-02-10 MED ORDER — ATORVASTATIN CALCIUM 20 MG PO TABS
20.0000 mg | ORAL_TABLET | ORAL | Status: DC
  Administered 2012-02-11: 20 mg via ORAL
  Filled 2012-02-10: qty 1

## 2012-02-10 MED ORDER — ONDANSETRON HCL 4 MG/2ML IJ SOLN: 4.0000 mg | Freq: Four times a day (QID) | INTRAMUSCULAR | Status: DC | PRN

## 2012-02-10 MED ORDER — ALBUTEROL SULFATE (5 MG/ML) 0.5% IN NEBU: 2.5000 mg | INHALATION_SOLUTION | RESPIRATORY_TRACT | Status: DC | PRN

## 2012-02-10 MED ORDER — NITROGLYCERIN 0.4 MG SL SUBL: 0.4000 mg | SUBLINGUAL_TABLET | SUBLINGUAL | Status: DC | PRN

## 2012-02-10 MED ORDER — WARFARIN - PHARMACIST DOSING INPATIENT: Freq: Every day | Status: DC

## 2012-02-10 MED ORDER — VITAMIN C 500 MG PO TABS
500.0000 mg | ORAL_TABLET | Freq: Every day | ORAL | Status: DC
  Administered 2012-02-10 – 2012-02-12 (×3): 500 mg via ORAL
  Filled 2012-02-10 (×3): qty 1

## 2012-02-10 NOTE — ED Notes (Signed)
Attempted to call report to nurse on 4W, Marcelino Duster, unable to take report, will call back

## 2012-02-10 NOTE — Progress Notes (Signed)
ED Cm spoke with Shandra at [phone] to inform her that pt to be admitted to hospital and Attending will write resumption or new orders as needed at d/c

## 2012-02-10 NOTE — ED Notes (Signed)
Patient transported to CT 

## 2012-02-10 NOTE — ED Provider Notes (Signed)
History     CSN: [id number]  Arrival date & time 02/10/12  1149   First MD Initiated Contact with Patient 02/10/12 1152      Chief Complaint  Patient presents with  . Shortness of Breath    (Consider location/radiation/quality/duration/timing/severity/associated sxs/prior treatment) HPI  77yoF h/o tachybradycardia syndrome status post pacemaker, atrophic fibrillation on Coumadin, hypertension, hyperlipidemia, coronary disease status post recent total knee replacement presents with fever, cough, hypoxia. Patient states last night she went to bed in her usual state of health. She states she woke up this morning with chills and shortness of breath. She has a minimal cough. She denies chest pain, chest discomfort. She denies new leg pain or swelling from baseline surgery. She has been taking Lovenox and was restarted on her Coumadin after the surgery. She denies dysuria, hematuria, urinary frequency, urgency. She has not taken anything for fever prior to arrival although notes low-grade fever at home. Denies abdominal pain/n/v. Mild frontal headache, no neck stiffness    ED Notes, ED Provider Notes from 02/10/12 0000 to 02/10/12 12:53:39       Crista Elliot, RN 02/10/2012 12:05      Per EMS pt awoke feeling SOB and had some wheezing. Pt does not normally wear o2. Pt had L TKA last week and was d/c'd home yesterday. Pt reports occ dry cough.         Crista Elliot, RN 02/10/2012 12:03      Pt had LTKA 1 week ago, woke up wheezing and SOB. EMS placed on o2 4lnc. EMS states sats are in upper 90's, and pt c/o occ dry cough. CBG 78.    Past Medical History  Diagnosis Date  . PAF (paroxysmal atrial fibrillation)     controlled with amiodarone, on coumadin  . CAD (coronary artery disease)   . Aortic root dilatation   . History of uterine cancer   . HTN (hypertension)   . Hyperlipemia   . Hypothyroidism   . Moderate aortic stenosis   . Chronic anticoagulation   . Tachycardia-bradycardia  syndrome     s/p PPM by Dr Deborah Chalk (MDT) 07/03/10  . GERD (gastroesophageal reflux disease)   . Chronic kidney disease   . PONV (postoperative nausea and vomiting)   . Pacemaker     2012  . Arthritis   . History of nephrectomy     left - due to kidney cancer  . Neuropathy     feet/legs  . Restless leg syndrome   . Headache     hx migraines - takes Zoloft for migraines  . Cancer     hx Kidney cancer / hx of endometrial cancer    Past Surgical History  Procedure Date  . Cardiac catheterization 2008  . Nephrectomy   . Total abdominal hysterectomy   . Patent ductus arterious repair age 41  . Ptvp   . Pacemaker insertion 07/04/11    by Dr Deborah Chalk  . Cholecystectomy   . Upper gastrointestinal endoscopy   . Joint replacement     rt total hip 2008  . Total knee arthroplasty 02/07/2012    Procedure: TOTAL KNEE ARTHROPLASTY;  Surgeon: Shelda Pal, MD;  Location: [facility];  Service: Orthopedics;  Laterality: Right;    Family History  Problem Relation Age of Onset  . Colon cancer Mother 67  . Heart disease Mother   . Heart disease Father   . Heart attack Father   . Heart disease Maternal Grandmother  History  Substance Use Topics  . Smoking status: Never Smoker   . Smokeless tobacco: Never Used  . Alcohol Use: No    OB History    Grav Para Term Preterm Abortions TAB SAB Ect Mult Living                  Review of Systems  All other systems reviewed and are negative.   except as noted HPI   Allergies  Codeine and Zinc  Home Medications   Current Outpatient Rx  Name Route Sig Dispense Refill  . AMIODARONE HCL 200 MG PO TABS Oral Take 200 mg by mouth every evening.     [name] VITAMIN C PO Oral Take 500 mg by mouth daily.     . ATENOLOL 100 MG PO TABS Oral Take 100 mg by mouth 2 (two) times daily.     . ATORVASTATIN CALCIUM 40 MG PO TABS Oral Take 20 mg by mouth See admin instructions. Takes 1 tablet Monday-Friday    . CALCIUM CARBONATE-VITAMIN D 500-200  MG-UNIT PO TABS Oral Take 1 tablet by mouth daily.    . B-12 PO Oral Take 1,000 mg by mouth daily.     [name] DIPHENHYDRAMINE HCL 25 MG PO CAPS Oral Take 1 capsule (25 mg total) by mouth every 6 (six) hours as needed for itching, allergies or sleep. 30 capsule   . DSS 100 MG PO CAPS Oral Take 100 mg by mouth 2 (two) times daily.    [name] ENOXAPARIN SODIUM 40 MG/0.4ML [place] SOLN Subcutaneous Inject 0.4 mLs (40 mg total) into the skin daily. 7 Syringe 0    Use with Coumadin to obtain therapeutic INR of 2-3  . FERROUS SULFATE 325 (65 FE) MG PO TABS Oral Take 1 tablet (325 mg total) by mouth 3 (three) times daily after meals.    . FUROSEMIDE 40 MG PO TABS Oral Take 20 mg by mouth every Monday, Wednesday, and Friday.     [name] HYDROCODONE-ACETAMINOPHEN 7.5-325 MG PO TABS Oral Take 1-2 tablets by mouth every 4 (four) hours. 120 tablet 0  . HYOSCYAMINE SULFATE ER 0.375 MG PO TBCR  Take one tab twice a day as needed for abdominal pain 25 each 1  . LEVOTHYROXINE SODIUM 100 MCG PO TABS Oral Take 50 mcg by mouth See admin instructions. Take 1/2 tab daily except for saturdays and sundays    . LOSARTAN POTASSIUM-HCTZ 100-25 MG PO TABS Oral Take 1 tablet by mouth daily.     [name] MAGNESIUM PO Oral Take 30 mg by mouth every Monday, Wednesday, and Friday.     . METHOCARBAMOL 500 MG PO TABS Oral Take 1 tablet (500 mg total) by mouth every 6 (six) hours as needed.    [name] NITROGLYCERIN 0.4 MG/HR TD PT24 Transdermal Place 1 patch onto the skin as needed. CHEST PAINS    . NITROGLYCERIN 0.4 MG SL SUBL Sublingual Place 0.4 mg under the tongue every 5 (five) minutes as needed. CHEST PAINS    . OMEGA-3-ACID ETHYL ESTERS 1 G PO CAPS Oral Take 1 g by mouth daily.    [name] POTASSIUM CHLORIDE CRYS ER 20 MEQ PO TBCR Oral Take 1 tablet (20 mEq total) by mouth 3 (three) times daily. 270 tablet 3  . PREGABALIN 75 MG PO CAPS Oral Take 75 mg by mouth 2 (two) times daily.     . SERTRALINE HCL 100 MG PO TABS Oral Take 100 mg by mouth every evening.     [name]  WARFARIN SODIUM 2 MG PO TABS Oral Take 1-3 mg by mouth See admin instructions. Take 2mg  every night except Thursday take 1mg       BP 154/50  Pulse 87  Temp(Src) 100.8 F (38.2 C) (Rectal)  Resp 18  SpO2 87%  Physical Exam  Nursing note and vitals reviewed. Constitutional: She is oriented to person, place, and time. She appears well-developed.  HENT:  Head: Atraumatic.  Mouth/Throat: Oropharynx is clear and moist.  Eyes: Conjunctivae and EOM are normal. Pupils are equal, round, and reactive to light.  Neck: Normal range of motion. Neck supple.  Cardiovascular: Normal rate, regular rhythm, normal heart sounds and intact distal pulses.   Pulmonary/Chest: Effort normal and breath sounds normal. No respiratory distress. She has no wheezes. She has no rales.       Bibasilar crackles  Abdominal: Soft. She exhibits no distension. There is no tenderness. There is no rebound and no guarding.  Musculoskeletal: Normal range of motion.       RLE surgical wound with dressing in place- surrounding ecchymosis, no erythema or warmth surrounding bandage  Neurological: She is alert and oriented to person, place, and time.  Skin: Skin is warm and dry. No rash noted.  Psychiatric: She has a normal mood and affect.    Date: 02/10/2012  Rate: a paced 61   ED Course  Procedures (including critical care time)  Labs Reviewed  CBC - Abnormal; Notable for the following:    WBC 12.8 (*)    RBC 3.78 (*)    Hemoglobin 10.9 (*)    HCT 33.9 (*)    RDW 16.1 (*)    All other components within normal limits  DIFFERENTIAL - Abnormal; Notable for the following:    Neutrophils Relative 79 (*)    Neutro Abs 10.1 (*)    All other components within normal limits  COMPREHENSIVE METABOLIC PANEL - Abnormal; Notable for the following:    Albumin 3.0 (*)    GFR calc non Af Amer 50 (*)    GFR calc Af Amer 58 (*)    All other components within normal limits  PROTIME-INR - Abnormal; Notable for the following:     Prothrombin Time 23.8 (*)    INR 2.09 (*)    All other components within normal limits  LIPASE, BLOOD  CULTURE, BLOOD (ROUTINE X 2)  CULTURE, BLOOD (ROUTINE X 2)  PRO B NATRIURETIC PEPTIDE   Dg Chest Portable 1 View  02/10/2012  *RADIOLOGY REPORT*  Clinical Data: Shortness of breath and weakness.  PORTABLE CHEST - 1 VIEW  Comparison: 03/29/2011.  Findings: Interval mild enlargement of the cardiac silhouette and mild increase in prominence of the pulmonary vasculature.  The interstitial markings are also slightly more prominent with possible small bilateral pleural effusions.  Diffuse osteopenia. Stable right subclavian pacemaker leads.  IMPRESSION: Interval mild cardiomegaly and mild changes of congestive heart failure.  Original Report Authenticated By: Darrol Angel, M.D.    1. Fever   2. Shortness of breath     MDM  Patient presents with low-grade fever, cough, shortness of breath. She did have a recent surgery. She has been on Lovenox and has been restarted on her Coumadin as well for her INR remained subtherapeutic. Her chest x-ray is remarkable for small bilateral pleural effusions and interstitial increased markings consistent with congestive heart failure although she has no history of the same. BNP added. Will hold abx for now-- CT angio pending r/o PE. D/W [facility].  Forbes Cellar, MD 02/10/12 1711

## 2012-02-10 NOTE — H&P (Signed)
History and Physical       Hospital Admission Note Date: 02/10/2012  Patient name: [name] Medical record number: [id number] Date of birth: May 11, 1934 Age: 78 y.o. Gender: female PCP: Nadean Corwin, MD, MD  Primary cardiologist: Dr. Patty Sermons EP for pacemaker: Dr. Johney Frame Orthopedics: Dr. Durene Romans   Chief Complaint:  Couldn't breathe today  HPI: Patient is a 78 year old female who was just discharged from the [facility] yesterday after right total knee arthroplasty done on 02/07/2012, with past medical history of paroxysmal atrial fibrillation, on Coumadin, hypertension, hyperlipidemia, hypothyroidism, moderate aortic stenosis, tachybradycardia syndrome status post pacemaker in 2011. Patient also has a history of congenital heart disease, hypertension, repair of PDA, nonobstructive CAD. The patient had uneventful night however she woke up this morning feeling very short of breath. Patient had home physical therapist today who checked O2 sats at home and found in low 80s and recommended going to ED for evaluation. Patient also states that she had significant wheezing this morning with chest tightness but no chest pain, palpitations, diaphoresis or any radiation of the chest tightness. Patient denied any fevers or chills however she has been having hacking cough for last few days.  Review of Systems:  Constitutional: Denies fever, chills, diaphoresis, appetite change and fatigue.  HEENT: Denies photophobia, eye pain, redness, hearing loss, ear pain, congestion, sore throat, rhinorrhea, sneezing, mouth sores, trouble swallowing, neck pain, neck stiffness and tinnitus.   Respiratory: Please see history of present illness.   Cardiovascular: Please see history of present illness.  Gastrointestinal: Denies nausea, vomiting, abdominal pain, diarrhea, constipation, blood in stool and abdominal distention.  Genitourinary:  Denies dysuria, urgency, frequency, hematuria, flank pain and difficulty urinating.  Musculoskeletal: Patient recently had total knee arthroplasty on 02/07/2012.  Skin: Denies pallor, rash and wound.  Neurological: Denies dizziness, seizures, syncope, weakness, light-headedness, numbness and headaches.  Hematological: Denies adenopathy. Easy bruising, personal or family bleeding history  Psychiatric/Behavioral: Denies suicidal ideation, mood changes, confusion, nervousness, sleep disturbance and agitation  Past Medical History: Past Medical History  Diagnosis Date  . PAF (paroxysmal atrial fibrillation)     controlled with amiodarone, on coumadin  . CAD (coronary artery disease)   . Aortic root dilatation   . History of uterine cancer   . HTN (hypertension)   . Hyperlipemia   . Hypothyroidism   . Moderate aortic stenosis   . Chronic anticoagulation   . Tachycardia-bradycardia syndrome     s/p PPM by Dr Deborah Chalk (MDT) 07/03/10  . GERD (gastroesophageal reflux disease)   . Chronic kidney disease   . PONV (postoperative nausea and vomiting)   . Pacemaker     2012  . Arthritis   . History of nephrectomy     left - due to kidney cancer  . Neuropathy     feet/legs  . Restless leg syndrome   . Headache     hx migraines - takes Zoloft for migraines  . Cancer     hx Kidney cancer / hx of endometrial cancer   Past Surgical History  Procedure Date  . Cardiac catheterization 2008  . Nephrectomy   . Total abdominal hysterectomy   . Patent ductus arterious repair age 54  . Ptvp   . Pacemaker insertion 07/04/11    by Dr Deborah Chalk  . Cholecystectomy   . Upper gastrointestinal endoscopy   . Joint replacement     rt total hip 2008  . Total knee arthroplasty 02/07/2012    Procedure: TOTAL KNEE ARTHROPLASTY;  Surgeon: Shelda Pal, MD;  Location: [facility];  Service: Orthopedics;  Laterality: Right;    Medications: Prior to Admission medications   Medication Sig Start Date End Date  Taking? Authorizing Provider  amiodarone (PACERONE) 200 MG tablet Take 200 mg by mouth every evening.    Yes Historical Provider, MD  Ascorbic Acid (VITAMIN C PO) Take 500 mg by mouth daily.    Yes Historical Provider, MD  atenolol (TENORMIN) 100 MG tablet Take 100 mg by mouth 2 (two) times daily.    Yes Historical Provider, MD  atorvastatin (LIPITOR) 40 MG tablet Take 20 mg by mouth See admin instructions. Takes 1 tablet Monday-Friday   Yes Historical Provider, MD  calcium-vitamin D (OSCAL WITH D) 500-200 MG-UNIT per tablet Take 1 tablet by mouth daily.   Yes Historical Provider, MD  Cyanocobalamin (B-12 PO) Take 1,000 mg by mouth daily.    Yes Historical Provider, MD  diphenhydrAMINE (BENADRYL) 25 mg capsule Take 1 capsule (25 mg total) by mouth every 6 (six) hours as needed for itching, allergies or sleep. 02/09/12 02/19/12 Yes Genelle Gather Babish, PA  Docusate Sodium (DSS) 100 MG CAPS Take 100 mg by mouth 2 (two) times daily. 02/09/12 02/19/12 Yes Genelle Gather Babish, PA  enoxaparin (LOVENOX) 40 MG/0.4ML injection Inject 0.4 mLs (40 mg total) into the skin daily. 02/09/12  Yes Genelle Gather Babish, PA  ferrous sulfate 325 (65 FE) MG tablet Take 1 tablet (325 mg total) by mouth 3 (three) times daily after meals. 02/09/12 02/08/13 Yes Genelle Gather Babish, PA  furosemide (LASIX) 40 MG tablet Take 20 mg by mouth every Monday, Wednesday, and Friday.    Yes Historical Provider, MD  HYDROcodone-acetaminophen (NORCO) 7.5-325 MG per tablet Take 1-2 tablets by mouth every 4 (four) hours. 02/09/12 02/19/12 Yes Genelle Gather Babish, PA  Hyoscyamine Sulfate (HYOMAX-DT) 0.375 MG TBCR Take one tab twice a day as needed for abdominal pain 07/27/11  Yes Louis Meckel, MD  levothyroxine (SYNTHROID, LEVOTHROID) 100 MCG tablet Take 50 mcg by mouth See admin instructions. Take 1/2 tab daily except for saturdays and sundays   Yes Historical Provider, MD  losartan-hydrochlorothiazide (HYZAAR) 100-25 MG per tablet Take 1 tablet by  mouth daily.    Yes Historical Provider, MD  MAGNESIUM PO Take 30 mg by mouth every Monday, Wednesday, and Friday.    Yes Historical Provider, MD  methocarbamol (ROBAXIN) 500 MG tablet Take 1 tablet (500 mg total) by mouth every 6 (six) hours as needed. 02/09/12 02/19/12 Yes Genelle Gather Babish, PA  nitroGLYCERIN (NITRODUR - DOSED IN MG/24 HR) 0.4 mg/hr Place 1 patch onto the skin as needed. CHEST PAINS   Yes Historical Provider, MD  nitroGLYCERIN (NITROSTAT) 0.4 MG SL tablet Place 0.4 mg under the tongue every 5 (five) minutes as needed. CHEST PAINS   Yes Historical Provider, MD  omega-3 acid ethyl esters (LOVAZA) 1 G capsule Take 1 g by mouth daily.   Yes Historical Provider, MD  potassium chloride SA (K-DUR,KLOR-CON) 20 MEQ tablet Take 1 tablet (20 mEq total) by mouth 3 (three) times daily. 07/13/11  Yes Cassell Clement, MD  pregabalin (LYRICA) 75 MG capsule Take 75 mg by mouth 2 (two) times daily.    Yes Historical Provider, MD  sertraline (ZOLOFT) 100 MG tablet Take 100 mg by mouth every evening.    Yes Historical Provider, MD  warfarin (COUMADIN) 2 MG tablet Take 1-3 mg by mouth See admin instructions. Take 2mg  every night except Thursday take 1mg  02/09/12  Yes Genelle Gather Gilman, PA    Allergies:   Allergies  Allergen Reactions  . Codeine Nausea And Vomiting  . Zinc     nausea    Social History:  reports that she has never smoked. She has never used smokeless tobacco. She reports that she does not drink alcohol or use illicit drugs. Patient currently lives at home with her husband and ambulates with a cane  Family History: Family History  Problem Relation Age of Onset  . Colon cancer Mother 31  . Heart disease Mother   . Heart disease Father   . Heart attack Father   . Heart disease Maternal Grandmother     Physical Exam: Blood pressure 154/50, pulse 87, temperature 100.8 F (38.2 C), temperature source Rectal, resp. rate 18, SpO2 87.00%. General: Alert, awake, oriented x3,  in no acute distress. HEENT: anicteric sclera, pink conjunctiva, pupils equal and reactive to light and accomodation Neck: supple, no masses or lymphadenopathy, no goiter, no bruits  Heart: Regular rate and rhythm, 3/6 harsh systolic ejection murmur of aortic stenosis, no rubs or gallops. Lungs: Decreased breath sound at the bases, no significant wheezing currently  Abdomen: Soft, nontender, nondistended, positive bowel sounds, no masses. Extremities: No clubbing, cyanosis or edema with positive pedal pulses. Neuro: Grossly intact, no focal neurological deficits, strength 5/5 upper and lower extremities bilaterally Psych: alert and oriented x 3, normal mood and affect Skin: no rashes or lesions, warm and dry   LABS on Admission:  Basic Metabolic Panel:  Lab 02/10/12 2440 02/09/12 0410  NA 140 137  K 3.5 4.1  CL 103 104  CO2 28 24  GLUCOSE 82 144*  BUN 20 17  CREATININE 1.06 0.97  CALCIUM 8.8 8.6  MG -- --  PHOS -- --   Liver Function Tests:  Lab 02/10/12 1250  AST 30  ALT 27  ALKPHOS 106  BILITOT 0.6  PROT 6.5  ALBUMIN 3.0*    Lab 02/10/12 1250  LIPASE 18  AMYLASE --   CBC:  Lab 02/10/12 1250 02/09/12 0410  WBC 12.8* 9.6  NEUTROABS 10.1* --  HGB 10.9* 10.7*  HCT 33.9* 32.7*  MCV 89.7 --  PLT 165 171     Radiological Exams on Admission: Dg Chest Portable 1 View  02/10/2012  *RADIOLOGY REPORT*  Clinical Data: Shortness of breath and weakness.  PORTABLE CHEST - 1 VIEW  Comparison: 03/29/2011.  Findings: Interval mild enlargement of the cardiac silhouette and mild increase in prominence of the pulmonary vasculature.  The interstitial markings are also slightly more prominent with possible small bilateral pleural effusions.  Diffuse osteopenia. Stable right subclavian pacemaker leads.  IMPRESSION: Interval mild cardiomegaly and mild changes of congestive heart failure.  Original Report Authenticated By: Darrol Angel, M.D.    Assessment/Plan Present on  Admission:   .Acute respiratory failure with hypoxia: Multifactorial, possibly secondary to fluid overload during hospitalization, (no prior history of CHF, recent echo with EF of 55-60%, however elevated BNP of 4959) vs pneumonia (low-grade fevers with leukocytosis) vs bronchitis vs rule out pulmonary embolism - Patient's respiratory status has somewhat improved, at the time of my encounter O2 sats in high 90's on 4 L O2 via nasal cannula, which I weaned down to 3 L. Admit to telemetry, obtain serial cardiac enzymes, CT angiogram of the chest to rule out PE. - Lasix 40 mg IV x1 now and continue daily, scheduled nebs, incentive spirometry, IV Levaquin (however chest x-ray were consistent with bilateral pleural effusion  and fluid overload rather than pneumonia), will await CT angiogram results - Obtain 2-D echo, continue beta blocker, Coumadin  Right total knee arthroplasty: on 02/07/12  - Obtain PT evaluation, ortho consult if needed, currently stable from orthopedic standpoint  - Continue Os-Cal D, PT evaluation - Continue Lovenox until INR above 2 and therapeutic, continue Coumadin per pharmacy   .HYPOTHYROIDISM - Obtain TSH, continue Synthroid   .HYPERLIPIDEMIA - Continue statins (Lipitor)   .ANXIETY: Stable  .HYPERTENSION: - Continue losartan-HCTZ, beta blocker, lasix   .Atrial fibrillation: Rate controlled - Continue amiodarone, beta blocker - continue Coumadin per pharmacy   .Tachycardia-bradycardia syndrome: S/p pacemaker    DVT prophylaxis:  on Coumadin  CODE STATUS:  FULL CODE STATUS  Family communication: Patient's daughter Lafonda Mosses phone #[phone]   Further plan will depend as patient's clinical course evolves and further radiologic and laboratory data become available.   @Time  Spent on Admission: 1 HOUR Sawsan Riggio M.D. [facility] 02/10/2012, 3:11 PM

## 2012-02-10 NOTE — Progress Notes (Signed)
ANTIBIOTIC CONSULT NOTE - INITIAL  Pharmacy Consult for:  Coumadin Indication: Paroxysmal atrial fibrillation and VTE prophylaxis following right total knee arthroplasty  Allergies  Allergen Reactions  . Codeine Nausea And Vomiting  . Zinc     nausea   Patient Measurements: Height: 5\' 1"  (154.9 cm) Weight: 155 lb 6.8 oz (70.5 kg) IBW/kg (Calculated) : 47.8   Vital Signs: Temp: 98.4 F (36.9 C) (06/06 1645) Temp src: Oral (06/06 1605) BP: 172/78 mmHg (06/06 1645) Pulse Rate: 66  (06/06 1645)  Labs:  Basename 02/10/12 1250 02/09/12 0410 02/08/12 0436  WBC 12.8* 9.6 13.0*  HGB 10.9* 10.7* 11.1*  PLT 165 171 167  LABCREA -- -- --  CREATININE 1.06 0.97 0.97   Estimated Creatinine Clearance: 41.2 ml/min (by C-G formula based on Cr of 1.06).  Medical History: Past Medical History  Diagnosis Date  . PAF (paroxysmal atrial fibrillation)     controlled with amiodarone, on coumadin  . CAD (coronary artery disease)   . Aortic root dilatation   . HTN (hypertension)   . Hyperlipemia   . Hypothyroidism   . Moderate aortic stenosis   . Chronic anticoagulation   . Tachycardia-bradycardia syndrome     s/p PPM by Dr Deborah Chalk (MDT) 07/03/10  . GERD (gastroesophageal reflux disease)   . Chronic kidney disease   . PONV (postoperative nausea and vomiting)   . Pacemaker     2012  . Arthritis   . History of nephrectomy     left - due to kidney cancer  . Neuropathy     feet/legs  . Restless leg syndrome   . Headache     hx migraines - takes Zoloft for migraines  . History of uterine cancer   . Cancer     hx Kidney cancer / hx of endometrial cancer   Medications:  Prescriptions prior to admission  Medication Sig Dispense Refill  . amiodarone (PACERONE) 200 MG tablet Take 200 mg by mouth every evening.       . Ascorbic Acid (VITAMIN C PO) Take 500 mg by mouth daily.       [name] atenolol (TENORMIN) 100 MG tablet Take 100 mg by mouth 2 (two) times daily.       [name] atorvastatin  (LIPITOR) 40 MG tablet Take 20 mg by mouth See admin instructions. Takes 1 tablet Monday-Friday      . calcium-vitamin D (OSCAL WITH D) 500-200 MG-UNIT per tablet Take 1 tablet by mouth daily.      . Cyanocobalamin (B-12 PO) Take 1,000 mg by mouth daily.       . diphenhydrAMINE (BENADRYL) 25 mg capsule Take 1 capsule (25 mg total) by mouth every 6 (six) hours as needed for itching, allergies or sleep.  30 capsule    . Docusate Sodium (DSS) 100 MG CAPS Take 100 mg by mouth 2 (two) times daily.      [name] enoxaparin (LOVENOX) 40 MG/0.4ML injection Inject 0.4 mLs (40 mg total) into the skin daily.  7 Syringe  0  . ferrous sulfate 325 (65 FE) MG tablet Take 1 tablet (325 mg total) by mouth 3 (three) times daily after meals.      . furosemide (LASIX) 40 MG tablet Take 20 mg by mouth every Monday, Wednesday, and Friday.       [name] HYDROcodone-acetaminophen (NORCO) 7.5-325 MG per tablet Take 1-2 tablets by mouth every 4 (four) hours.  120 tablet  0  . Hyoscyamine Sulfate (HYOMAX-DT) 0.375 MG TBCR Take one  tab twice a day as needed for abdominal pain  25 each  1  . levothyroxine (SYNTHROID, LEVOTHROID) 100 MCG tablet Take 50 mcg by mouth See admin instructions. Take 1/2 tab daily except for saturdays and sundays      . losartan-hydrochlorothiazide (HYZAAR) 100-25 MG per tablet Take 1 tablet by mouth daily.       [name] MAGNESIUM PO Take 30 mg by mouth every Monday, Wednesday, and Friday.       . methocarbamol (ROBAXIN) 500 MG tablet Take 1 tablet (500 mg total) by mouth every 6 (six) hours as needed.      . nitroGLYCERIN (NITRODUR - DOSED IN MG/24 HR) 0.4 mg/hr Place 1 patch onto the skin as needed. CHEST PAINS      . nitroGLYCERIN (NITROSTAT) 0.4 MG SL tablet Place 0.4 mg under the tongue every 5 (five) minutes as needed. CHEST PAINS      . omega-3 acid ethyl esters (LOVAZA) 1 G capsule Take 1 g by mouth daily.      . potassium chloride SA (K-DUR,KLOR-CON) 20 MEQ tablet Take 1 tablet (20 mEq total) by mouth 3 (three)  times daily.  270 tablet  3  . pregabalin (LYRICA) 75 MG capsule Take 75 mg by mouth 2 (two) times daily.       . sertraline (ZOLOFT) 100 MG tablet Take 100 mg by mouth every evening.       . warfarin (COUMADIN) 2 MG tablet Take 1-3 mg by mouth See admin instructions. Take 2mg  every night except Thursday take 1mg        Assessment: Asked to assist with Coumadin therapy for 78 year-old female taking Coumadin on a chronic basis for atrial fibrillation.  [name] was discharged yesterday after hospitalization for right total knee arthroplasty.  The Coumadin will also provide post-op VTE prophylaxis.  The INR is within the therapeutic range on admission.  Goal of Therapy:  INR 2-3  Plan:   1 mg dose today  Daily PT/INR  Discontinue Lovenox due to therapeutic INR.  Polo Riley R.Ph. 02/10/2012  6:54 PM

## 2012-02-10 NOTE — ED Notes (Signed)
Pt had LTKA 1 week ago, woke up wheezing and SOB. EMS placed on o2 4lnc. EMS states sats are in upper 90's, and pt c/o occ dry cough. CBG 78.

## 2012-02-10 NOTE — ED Notes (Signed)
Report called to [facility], pt being transported to [phone]

## 2012-02-10 NOTE — ED Notes (Signed)
Per EMS pt awoke feeling SOB and had some wheezing. Pt does not normally wear o2. Pt had L TKA last week and was d/c'd home yesterday. Pt reports occ dry cough.

## 2012-02-10 NOTE — ED Notes (Signed)
HYQ:[id number]<IO> Expected date:<BR> Expected time:11:46 AM<BR> Means of arrival:<BR> Comments:<BR> FORS21 - 77yoF SOB on exhertion, had a knee replacem mon *can sit in chair* but is on O2

## 2012-02-11 DIAGNOSIS — J9600 Acute respiratory failure, unspecified whether with hypoxia or hypercapnia: Secondary | ICD-10-CM

## 2012-02-11 DIAGNOSIS — I10 Essential (primary) hypertension: Secondary | ICD-10-CM

## 2012-02-11 DIAGNOSIS — I4891 Unspecified atrial fibrillation: Secondary | ICD-10-CM

## 2012-02-11 DIAGNOSIS — I5023 Acute on chronic systolic (congestive) heart failure: Secondary | ICD-10-CM

## 2012-02-11 DIAGNOSIS — I359 Nonrheumatic aortic valve disorder, unspecified: Secondary | ICD-10-CM

## 2012-02-11 LAB — BASIC METABOLIC PANEL
BUN: 17 mg/dL (ref 6–23)
Calcium: 8.2 mg/dL — ABNORMAL LOW (ref 8.4–10.5)
Creatinine, Ser: 1 mg/dL (ref 0.50–1.10)
GFR calc Af Amer: 62 mL/min — ABNORMAL LOW (ref >90)

## 2012-02-11 LAB — CARDIAC PANEL(CRET KIN+CKTOT+MB+TROPI)
CK, MB: 2.3 ng/mL (ref 0.3–4.0)
CK, MB: 2.5 ng/mL (ref 0.3–4.0)
Total CK: 219 U/L — ABNORMAL HIGH (ref 7–177)
Troponin I: <0.3 ng/mL (ref <0.30)

## 2012-02-11 LAB — CBC
MCHC: 32.5 g/dL (ref 30.0–36.0)
Platelets: 151 10*3/uL (ref 150–400)
RDW: 15.9 % — ABNORMAL HIGH (ref 11.5–15.5)
WBC: 6.9 10*3/uL (ref 4.0–10.5)

## 2012-02-11 LAB — PRO B NATRIURETIC PEPTIDE: Pro B Natriuretic peptide (BNP): 4259 pg/mL — ABNORMAL HIGH (ref 0–450)

## 2012-02-11 LAB — PROTIME-INR: INR: 1.85 — ABNORMAL HIGH (ref 0.00–1.49)

## 2012-02-11 MED ORDER — ALBUTEROL SULFATE (5 MG/ML) 0.5% IN NEBU
2.5000 mg | INHALATION_SOLUTION | Freq: Four times a day (QID) | RESPIRATORY_TRACT | Status: DC
  Administered 2012-02-11 (×3): 2.5 mg via RESPIRATORY_TRACT
  Filled 2012-02-11 (×3): qty 0.5

## 2012-02-11 MED ORDER — IPRATROPIUM BROMIDE 0.02 % IN SOLN
0.5000 mg | Freq: Four times a day (QID) | RESPIRATORY_TRACT | Status: DC
  Administered 2012-02-11 (×3): 0.5 mg via RESPIRATORY_TRACT
  Filled 2012-02-11 (×3): qty 2.5

## 2012-02-11 MED ORDER — ALBUTEROL SULFATE (5 MG/ML) 0.5% IN NEBU: 2.5000 mg | INHALATION_SOLUTION | RESPIRATORY_TRACT | Status: DC | PRN

## 2012-02-11 MED ORDER — POTASSIUM CHLORIDE CRYS ER 20 MEQ PO TBCR
40.0000 meq | EXTENDED_RELEASE_TABLET | Freq: Every day | ORAL | Status: DC
  Administered 2012-02-11 – 2012-02-12 (×2): 40 meq via ORAL
  Filled 2012-02-11 (×2): qty 2

## 2012-02-11 MED ORDER — WARFARIN SODIUM 2.5 MG PO TABS
2.5000 mg | ORAL_TABLET | Freq: Once | ORAL | Status: AC
  Administered 2012-02-11: 2.5 mg via ORAL
  Filled 2012-02-11: qty 1

## 2012-02-11 NOTE — Progress Notes (Signed)
OT Note:  Checked with pt.  She plans to return home with HHPT.  She has no OT needs at this time.  [place], [place] [phone] 02/11/2012

## 2012-02-11 NOTE — Progress Notes (Addendum)
ANTICOAGULATION CONSULT NOTE - Follow Up Consult  Pharmacy Consult for warfarin Indication: atrial fibrillation and VTE prophylaxis  Allergies  Allergen Reactions  . Codeine Nausea And Vomiting  . Zinc     nausea    Patient Measurements: Height: 5\' 1"  (154.9 cm) Weight: 155 lb 6.8 oz (70.5 kg) IBW/kg (Calculated) : 47.8   Vital Signs: Temp: 98.3 F (36.8 C) (06/07 0558) Temp src: Oral (06/07 0558) BP: 134/69 mmHg (06/07 0558) Pulse Rate: 66  (06/07 0558)  Labs:  Basename 02/11/12 0520 02/10/12 2340 02/10/12 1830 02/10/12 1250 02/09/12 0410  HGB 10.2* -- -- 10.9* --  HCT 31.4* -- -- 33.9* 32.7*  PLT 151 -- -- 165 171  APTT -- -- -- -- --  LABPROT 21.7* -- -- 23.8* 21.5*  INR 1.85* -- -- 2.09* 1.83*  HEPARINUNFRC -- -- -- -- --  CREATININE 1.00 -- -- 1.06 0.97  CKTOTAL 196* 219* 261* -- --  CKMB 2.3 2.5 3.1 -- --  TROPONINI <0.30 <0.30 <0.30 -- --    Estimated Creatinine Clearance: 43.7 ml/min (by C-G formula based on Cr of 1).   Medications:  Scheduled:    . acetaminophen  650 mg Oral Once  . albuterol  2.5 mg Nebulization Q6H WA  . amiodarone  200 mg Oral QPM  . amoxicillin-clavulanate  1 tablet Oral Q12H  . atenolol  100 mg Oral BID  . atorvastatin  20 mg Oral Custom  . calcium-vitamin D  1 tablet Oral Daily  . docusate sodium  100 mg Oral BID  . ferrous sulfate  325 mg Oral TID PC  . furosemide  40 mg Intravenous Daily  . losartan  100 mg Oral Daily   And  . hydrochlorothiazide  25 mg Oral Daily  . HYDROcodone-acetaminophen  1-2 tablet Oral Q4H  . ipratropium  0.5 mg Nebulization Q6H WA  . levothyroxine  50 mcg Oral QAC breakfast  . omega-3 acid ethyl esters  1 g Oral Daily  . potassium chloride SA  20 mEq Oral TID  . pregabalin  75 mg Oral BID  . sertraline  100 mg Oral QPM  . sodium chloride  500 mL Intravenous Once  . sodium chloride  3 mL Intravenous Q12H  . vitamin C  500 mg Oral Daily  . warfarin  1 mg Oral Once  . Warfarin - Pharmacist  Dosing Inpatient   Does not apply q1800  . DISCONTD: albuterol  2.5 mg Nebulization Q6H  . DISCONTD: DSS  100 mg Oral BID  . DISCONTD: enoxaparin  40 mg Subcutaneous Q24H  . DISCONTD: enoxaparin  40 mg Subcutaneous Q24H  . DISCONTD: ipratropium  0.5 mg Nebulization Q6H  . DISCONTD: levofloxacin (LEVAQUIN) IV  750 mg Intravenous Q24H  . DISCONTD: losartan-hydrochlorothiazide  1 tablet Oral Daily   Infusions:    Assessment:  Patient takes warfarin chronically for Afib at a dose of 2mg  daily except 1mg  on Thursdays and also s/p TKA on 6/3  INR slightly subtherapeutic today - 1mg  given last night  Currently off of Lovenox (d/c'd yesterday) due to orders to d/c when INR > 1.8  No reported bleeding per notes  Stable CBC  Note patient also on amiodarone  Goal of Therapy:  INR 2-3   Plan:   2.5mg  warfarin today  Daily INR  Will need to start Lovenox again if INR drops tomorrow   Hessie Knows, PharmD, BCPS Pager [phone] 02/11/2012 8:30 AM

## 2012-02-11 NOTE — Evaluation (Addendum)
Physical Therapy Evaluation Patient Details Name: [name] MRN: [id number] DOB: 06-01-1934 Today's Date: 02/11/2012 Time: 8295-6213 PT Time Calculation (min): 34 min  PT Assessment / Plan / Recommendation Clinical Impression  Patient is a 78 yo female admitted with dyspnea/hypoxia - probable CHF per chart.  Patient also s/p rt. TKA on 02/07/12, discharged day prior to this admission.  She is doing well with mobility/gait.  Spoke with nursing - go into bathroom rather than using BSC.  Patient did have decreased O2 sats on room air with gait - returned to 95% in < 30 seconds.  Has all equipment and supervision needed to return home.  Recommend continuing HHPT at discharge.    PT Assessment  Patient needs continued PT services    Follow Up Recommendations  Home health PT    Barriers to Discharge None      lEquipment Recommendations  None recommended by PT    Recommendations for Other Services     Frequency Min 6X/week    Precautions / Restrictions Precautions Precautions: Knee Precaution Comments: no pillow under knee Restrictions Weight Bearing Restrictions: Yes RLE Weight Bearing: Weight bearing as tolerated Other Position/Activity Restrictions: No ROM, pt unable to flex knee prior to surgery( since age of  5)       Mobility  Bed Mobility Bed Mobility: Supine to Sit;Sitting - Scoot to Edge of Bed Supine to Sit: 5: Supervision;HOB flat;With rails Sitting - Scoot to Edge of Bed: 5: Supervision Details for Bed Mobility Assistance: cues for safety Transfers Transfers: Sit to Stand;Stand to Sit Sit to Stand: 5: Supervision;With upper extremity assist;From bed Stand to Sit: 5: Supervision;With upper extremity assist;With armrests;To chair/3-in-1 Details for Transfer Assistance: cues for hand placcement Ambulation/Gait Ambulation/Gait Assistance: 5: Supervision Ambulation Distance (Feet): 92 Feet Assistive device: Rolling walker Gait Pattern: Step-to pattern;Decreased step  length - right;Decreased dorsiflexion - right;Antalgic Gait velocity: Slow gait speed General Gait Details: Cues to look up during gait and to to get heel to floor (toe walking on right)    Exercises Total Joint Exercises Ankle Circles/Pumps: AROM;10 reps;Both Quad Sets: AROM;Right;10 reps   PT Diagnosis: Difficulty walking;Abnormality of gait;Generalized weakness;Acute pain  PT Problem List: Decreased strength;Decreased range of motion;Decreased activity tolerance;Decreased mobility;Cardiopulmonary status limiting activity;Pain PT Treatment Interventions: DME instruction;Gait training;Functional mobility training;Therapeutic exercise;Therapeutic activities;Patient/family education   PT Goals Acute Rehab PT Goals PT Goal Formulation: With patient Time For Goal Achievement: 02/18/12 Potential to Achieve Goals: Good Pt will go Supine/Side to Sit: Independently;with HOB 0 degrees PT Goal: Supine/Side to Sit - Progress: Goal set today Pt will go Sit to Stand: with modified independence;with upper extremity assist PT Goal: Sit to Stand - Progress: Goal set today Pt will Ambulate: >150 feet;with supervision;with rolling walker PT Goal: Ambulate - Progress: Goal set today  Visit Information  Last PT Received On: 02/11/12 Assistance Needed: +1    Subjective Data  Subjective: I just took my pain meds.  My knee is hurting Patient Stated Goal: To return home   Prior Functioning  Home Living Lives With: Spouse Available Help at Discharge: Family Type of Home: House Home Access: Level entry Home Layout: Multi-level;Able to live on main level with bedroom/bathroom Bathroom Shower/Tub: Health visitor: Handicapped height Bathroom Accessibility: Yes Home Adaptive Equipment: Shower chair with back;Bedside commode/3-in-1;Walker - rolling;Straight cane;Wheelchair - manual (lift chair) Prior Function Level of Independence: Independent with assistive device(s) (used cane prior  to knee surgery) Driving: Yes 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 Communication Communication: No difficulties;HOH (wears hearing aids) Dominant Hand: Right    Cognition  Overall Cognitive Status: Appears within functional limits for tasks assessed/performed Arousal/Alertness: Awake/alert Orientation Level: Appears intact for tasks assessed Behavior During Session: [facility] for tasks performed    Extremity/Trunk Assessment Right Upper Extremity Assessment RUE ROM/Strength/Tone: Within functional levels RUE Sensation: WFL - Light Touch Left Upper Extremity Assessment LUE ROM/Strength/Tone: WFL for tasks assessed LUE Sensation: WFL - Light Touch Right Lower Extremity Assessment RLE ROM/Strength/Tone: Deficits RLE ROM/Strength/Tone Deficits: knee ROM limited prior to surgery (able to flex only 15 degrees prior to surgery RLE Sensation: WFL - Light Touch Left Lower Extremity Assessment LLE ROM/Strength/Tone: WFL for tasks assessed LLE Sensation: WFL - Light Touch   Balance    End of Session PT - End of Session Equipment Utilized During Treatment: Gait belt Activity Tolerance: Patient limited by fatigue;Treatment limited secondary to medical complications (Comment) (Patient with decreased O2 sats on room air (to 87%). ) Patient left: in chair;with call bell/phone within reach;with family/visitor present Nurse Communication: Mobility status;Weight bearing status   Vena Austria 02/11/2012, 10:00 AM Durenda Hurt. Renaldo Fiddler, [facility] Pager [phone]

## 2012-02-11 NOTE — Progress Notes (Signed)
  Echocardiogram 2D Echocardiogram has been performed.  Tayen Narang L 02/11/2012, 11:37 AM

## 2012-02-11 NOTE — Progress Notes (Signed)
UR complete 

## 2012-02-11 NOTE — Progress Notes (Signed)
Patient ID: [name]  female  ZOX:[id number]    DOB: [birth year]    DOA: 02/10/2012  PCP: Nadean Corwin, MD, MD  Subjective: Patient seen and examined this morning, improving, still somewhat hypoxic on ambulation  Objective: Weight change:   Intake/Output Summary (Last 24 hours) at 02/11/12 1551 Last data filed at 02/11/12 0900  Gross per 24 hour  Intake    243 ml  Output    950 ml  Net   -707 ml   Blood pressure 129/65, pulse 69, temperature 98.5 F (36.9 C), temperature source Oral, resp. rate 22, height 5\' 1"  (1.549 m), weight 70.5 kg (155 lb 6.8 oz), SpO2 93.00%.  Physical Exam: General: Alert and awake, oriented x3, not in any acute distress. HEENT: anicteric sclera, pupils reactive to light and accommodation, EOMI CVS: S1-S2 clear, no murmur rubs or gallops Chest: Decreased breath sound at the bases  Abdomen: soft nontender, nondistended, normal bowel sounds, no organomegaly Extremities: no cyanosis, clubbing or edema noted bilaterally Neuro: Cranial nerves II-XII intact, no focal neurological deficits  Lab Results: Basic Metabolic Panel:  Lab 02/11/12 8119 02/10/12 1250  NA 137 140  K 3.4* 3.5  CL 100 103  CO2 30 28  GLUCOSE 85 82  BUN 17 20  CREATININE 1.00 1.06  CALCIUM 8.2* 8.8  MG -- --  PHOS -- --   Liver Function Tests:  Lab 02/10/12 1250  AST 30  ALT 27  ALKPHOS 106  BILITOT 0.6  PROT 6.5  ALBUMIN 3.0*    Lab 02/10/12 1250  LIPASE 18  AMYLASE --   No results found for this basename: AMMONIA:2 in the last 168 hours CBC:  Lab 02/11/12 0520 02/10/12 1250  WBC 6.9 12.8*  NEUTROABS -- 10.1*  HGB 10.2* 10.9*  HCT 31.4* 33.9*  MCV 89.7 89.7  PLT 151 165   Cardiac Enzymes:  Lab 02/11/12 0520 02/10/12 2340 02/10/12 1830  CKTOTAL 196* 219* 261*  CKMB 2.3 2.5 3.1  CKMBINDEX -- -- --  TROPONINI <0.30 <0.30 <0.30     Micro Results: Recent Results (from the past 240 hour(s))  SURGICAL PCR SCREEN     Status: Normal   Collection Time   02/02/12  9:51 AM      Component Value Range Status Comment   MRSA, PCR NEGATIVE  NEGATIVE  Final    Staphylococcus aureus NEGATIVE  NEGATIVE  Final     Studies/Results: Ct Angio Chest W/cm &/or Wo Cm  02/10/2012  IMPRESSION:  1. No evidence of pulmonary embolism.  Mild motion degradation. 2.  Findings suspicious for congestive heart failure, with pleural effusions and cardiomegaly.  Small volume perihepatic ascites and mild septal thickening at the bases. 3.  Mild thoracic adenopathy.  This can be seen with congestive heart failure.  If confirmation of stability or resolution is desired, CT follow-up at 3 months could be performed. 4.  Coronary artery atherosclerosis.  Original Report Authenticated By: Consuello Bossier, M.D.   Dg Chest Portable 1 View  02/10/2012  *RADIOLOGY REPORT*  Clinical Data: Shortness of breath and weakness.  PORTABLE CHEST - 1 VIEW  Comparison: 03/29/2011.  Findings: Interval mild enlargement of the cardiac silhouette and mild increase in prominence of the pulmonary vasculature.  The interstitial markings are also slightly more prominent with possible small bilateral pleural effusions.  Diffuse osteopenia. Stable right subclavian pacemaker leads.  IMPRESSION: Interval mild cardiomegaly and mild changes of congestive heart failure.  Original Report Authenticated By: Darrol Angel, M.D.  Medications: Scheduled Meds:   . albuterol  2.5 mg Nebulization Q6H WA  . amiodarone  200 mg Oral QPM  . amoxicillin-clavulanate  1 tablet Oral Q12H  . atenolol  100 mg Oral BID  . atorvastatin  20 mg Oral Custom  . calcium-vitamin D  1 tablet Oral Daily  . docusate sodium  100 mg Oral BID  . ferrous sulfate  325 mg Oral TID PC  . furosemide  40 mg Intravenous Daily  . losartan  100 mg Oral Daily   And  . hydrochlorothiazide  25 mg Oral Daily  . HYDROcodone-acetaminophen  1-2 tablet Oral Q4H  . ipratropium  0.5 mg Nebulization Q6H WA  . levothyroxine  50 mcg  Oral QAC breakfast  . omega-3 acid ethyl esters  1 g Oral Daily  . potassium chloride  40 mEq Oral Daily  . pregabalin  75 mg Oral BID  . sertraline  100 mg Oral QPM  . sodium chloride  3 mL Intravenous Q12H  . vitamin C  500 mg Oral Daily  . warfarin  1 mg Oral Once  . warfarin  2.5 mg Oral Once  . Warfarin - Pharmacist Dosing Inpatient   Does not apply q1800  . DISCONTD: albuterol  2.5 mg Nebulization Q6H  . DISCONTD: DSS  100 mg Oral BID  . DISCONTD: enoxaparin  40 mg Subcutaneous Q24H  . DISCONTD: enoxaparin  40 mg Subcutaneous Q24H  . DISCONTD: ipratropium  0.5 mg Nebulization Q6H  . DISCONTD: levofloxacin (LEVAQUIN) IV  750 mg Intravenous Q24H  . DISCONTD: losartan-hydrochlorothiazide  1 tablet Oral Daily  . DISCONTD: potassium chloride SA  20 mEq Oral TID   Continuous Infusions:    Assessment/Plan: Principal Problem:  *Acute respiratory failure with hypoxia: Improving likely skin due to fluid overload during the previous hospitalization, patient has a history of grade 2 diastolic dysfunction with preserved EF of 55-60% - Continue Lasix, will reevaluate tomorrow again for home O2 qualification - No significant pneumonia on the CT scan,  continue scheduled nebs for acute bronchitis and Augmentin  Acute on chronic diastolic dysfunction/CHF: - Continue IV Lasix, 2-D echocardiogram done, results pending, serial cardiac enzymes negative for any acute ACS - Continue beta blocker, statins  History of atrial fibrillation/tachybradycardia syndrome: Rate controlled, has a pacemaker - Continue amiodarone, beta blocker - On chronic anticoagulation for A. Fib   HYPOTHYROIDISM: Continue Synthroid, TSH 2.6   HYPERLIPIDEMIA: Continue statins   ANXIETY: Stable   HYPERTENSION: Stable    S/P total knee arthroplasty: Doing well with physical therapy  DVT Prophylaxis: On Coumadin  Code Status: Full code  Disposition: Hopefully tomorrow home with home PT, RN followup    LOS: 1 day    Nickson Middlesworth M.D. [facility] 02/11/2012, 3:51 PM Pager: [phone]

## 2012-02-11 NOTE — Progress Notes (Signed)
Pt converted to atrial fib from atrial pacing.  Dr. Isidoro Donning aware with no new orders at this time.  MP[place]ning[place],RN

## 2012-02-12 LAB — URINE CULTURE: Culture  Setup Time: 201306070828

## 2012-02-12 MED ORDER — ENOXAPARIN SODIUM 30 MG/0.3ML ~~LOC~~ SOLN: 40.0000 mg | Freq: Every day | SUBCUTANEOUS | Status: DC

## 2012-02-12 MED ORDER — IPRATROPIUM BROMIDE 0.02 % IN SOLN: 0.5000 mg | Freq: Two times a day (BID) | RESPIRATORY_TRACT | Status: DC

## 2012-02-12 MED ORDER — FUROSEMIDE 40 MG PO TABS: 40.0000 mg | ORAL_TABLET | Freq: Every day | ORAL | Status: DC

## 2012-02-12 MED ORDER — WARFARIN SODIUM 5 MG PO TABS
5.0000 mg | ORAL_TABLET | Freq: Once | ORAL | Status: AC
  Administered 2012-02-12: 5 mg via ORAL
  Filled 2012-02-12: qty 1

## 2012-02-12 MED ORDER — AMOXICILLIN-POT CLAVULANATE 500-125 MG PO TABS: 1.0000 | ORAL_TABLET | Freq: Two times a day (BID) | ORAL | Status: AC

## 2012-02-12 MED ORDER — ALBUTEROL SULFATE HFA 108 (90 BASE) MCG/ACT IN AERS
1.0000 | INHALATION_SPRAY | Freq: Four times a day (QID) | RESPIRATORY_TRACT | Status: DC | PRN
  Administered 2012-02-12: 1 via RESPIRATORY_TRACT
  Filled 2012-02-12: qty 6.7

## 2012-02-12 MED ORDER — ALBUTEROL SULFATE (5 MG/ML) 0.5% IN NEBU: 2.5000 mg | INHALATION_SOLUTION | Freq: Two times a day (BID) | RESPIRATORY_TRACT | Status: DC

## 2012-02-12 MED ORDER — WARFARIN SODIUM 5 MG PO TABS
5.0000 mg | ORAL_TABLET | Freq: Once | ORAL | Status: DC
  Filled 2012-02-12: qty 1

## 2012-02-12 MED ORDER — ENOXAPARIN SODIUM 40 MG/0.4ML ~~LOC~~ SOLN: 40.0000 mg | Freq: Every day | SUBCUTANEOUS | Status: DC

## 2012-02-12 MED ORDER — ENOXAPARIN SODIUM 40 MG/0.4ML ~~LOC~~ SOLN
40.0000 mg | Freq: Once | SUBCUTANEOUS | Status: AC
  Administered 2012-02-12: 40 mg via SUBCUTANEOUS
  Filled 2012-02-12: qty 0.4

## 2012-02-12 MED ORDER — ALBUTEROL SULFATE HFA 108 (90 BASE) MCG/ACT IN AERS: 1.0000 | INHALATION_SPRAY | Freq: Four times a day (QID) | RESPIRATORY_TRACT | Status: DC | PRN

## 2012-02-12 NOTE — Discharge Summary (Signed)
Physician Discharge Summary  Patient ID: [name] MRN: [id number] DOB/AGE: 1933/09/21 78 y.o.  Admit date: 02/10/2012 Discharge date: 02/12/2012  Primary Care Physician:  Nadean Corwin, MD, MD  Discharge Diagnoses:    .Acute respiratory failure with hypoxia resolved likely scheduled to fluid overload  .HYPOTHYROIDISM .HYPERLIPIDEMIA .ANXIETY .HYPERTENSION .Atrial fibrillation .Tachycardia-bradycardia syndrome .CHF, acute on chronic improved  Consults: None   Discharge Medications: Medication List  As of 02/12/2012 10:13 AM   TAKE these medications         albuterol 108 (90 BASE) MCG/ACT inhaler   Commonly known as: PROVENTIL HFA;VENTOLIN HFA   Inhale 1 puff into the lungs every 6 (six) hours as needed for wheezing or shortness of breath.      amiodarone 200 MG tablet   Commonly known as: PACERONE   Take 200 mg by mouth every evening.      amoxicillin-clavulanate 500-125 MG per tablet   Commonly known as: AUGMENTIN   Take 1 tablet (500 mg total) by mouth every 12 (twelve) hours. X 5 days      atenolol 100 MG tablet   Commonly known as: TENORMIN   Take 100 mg by mouth 2 (two) times daily.      atorvastatin 40 MG tablet   Commonly known as: LIPITOR   Take 20 mg by mouth See admin instructions. Takes 1 tablet Monday-Friday      B-12 PO   Take 1,000 mg by mouth daily.      calcium-vitamin D 500-200 MG-UNIT per tablet   Commonly known as: OSCAL WITH D   Take 1 tablet by mouth daily.      diphenhydrAMINE 25 mg capsule   Commonly known as: BENADRYL   Take 1 capsule (25 mg total) by mouth every 6 (six) hours as needed for itching, allergies or sleep.      DSS 100 MG Caps   Take 100 mg by mouth 2 (two) times daily.      enoxaparin 30 MG/0.3ML injection   Commonly known as: LOVENOX   Inject 0.4 mLs (40 mg total) into the skin daily. Continue till INR therapeutic 2-3. Patient has prescription from previous discharge from orthopedics.      ferrous sulfate  325 (65 FE) MG tablet   Take 1 tablet (325 mg total) by mouth 3 (three) times daily after meals.      furosemide 40 MG tablet   Commonly known as: LASIX   Take 1 tablet (40 mg total) by mouth daily. Dose to be adjusted by Dr. Patty Sermons      HYDROcodone-acetaminophen 7.5-325 MG per tablet   Commonly known as: NORCO   Take 1-2 tablets by mouth every 4 (four) hours.      Hyoscyamine Sulfate 0.375 MG Tbcr   Take one tab twice a day as needed for abdominal pain      levothyroxine 100 MCG tablet   Commonly known as: SYNTHROID, LEVOTHROID   Take 50 mcg by mouth See admin instructions. Take 1/2 tab daily except for saturdays and sundays      losartan-hydrochlorothiazide 100-25 MG per tablet   Commonly known as: HYZAAR   Take 1 tablet by mouth daily.      MAGNESIUM PO   Take 30 mg by mouth every Monday, Wednesday, and Friday.      methocarbamol 500 MG tablet   Commonly known as: ROBAXIN   Take 1 tablet (500 mg total) by mouth every 6 (six) hours as needed.  nitroGLYCERIN 0.4 MG SL tablet   Commonly known as: NITROSTAT   Place 0.4 mg under the tongue every 5 (five) minutes as needed. CHEST PAINS      nitroGLYCERIN 0.4 mg/hr   Commonly known as: NITRODUR - Dosed in mg/24 hr   Place 1 patch onto the skin as needed. CHEST PAINS      omega-3 acid ethyl esters 1 G capsule   Commonly known as: LOVAZA   Take 1 g by mouth daily.      potassium chloride SA 20 MEQ tablet   Commonly known as: K-DUR,KLOR-CON   Take 1 tablet (20 mEq total) by mouth 3 (three) times daily.      pregabalin 75 MG capsule   Commonly known as: LYRICA   Take 75 mg by mouth 2 (two) times daily.      sertraline 100 MG tablet   Commonly known as: ZOLOFT   Take 100 mg by mouth every evening.      VITAMIN C PO   Take 500 mg by mouth daily.      warfarin 2 MG tablet   Commonly known as: COUMADIN   Take 1-3 mg by mouth See admin instructions. Take 2mg  every night except Thursday take 1mg               Brief H and P: For complete details please refer to admission H and P, but in brief per my dictated H&P, Patient is a 78 year old female who was just discharged from the [facility] a day before admission and after right total knee arthroplasty done on 02/07/2012, with past medical history of paroxysmal atrial fibrillation, on Coumadin, hypertension, hyperlipidemia, hypothyroidism, moderate aortic stenosis, tachybradycardia syndrome status post pacemaker in 2011. Patient also has a history of congenital heart disease, hypertension, repair of PDA, nonobstructive CAD. The patient had uneventful night however she woke up on the morning feeling very short of breath. Patient had home physical therapist today who checked O2 sats at home and found in low 80s and recommended going to ED for evaluation. Patient also states that she had significant wheezing with chest tightness but no chest pain, palpitations, diaphoresis or any radiation of the chest tightness. Patient denied any fevers or chills however she has been having hacking cough for last few days.   Hospital Course:  *Acute respiratory failure with hypoxia: Improved, likely secondary to fluid overload during the previous hospitalization, patient has a history of grade 2 diastolic dysfunction with preserved EF of 55-60%. Patient was admitted to the telemetry floor and placed on IV Lasix which significantly improved her symptoms. Patient underwent 2-D echocardiogram which showed EF 65-70%, grade 1 diastolic dysfunction. Patient was instructed to continue oral Lasix daily until her appointment with Dr. Patty Sermons next week then her dose will be adjusted on the followup.  Acute bronchitis: Patient did have CT scan of the chest which did not show any pneumonia however it confirmed CHF with pleural effusions and cardiomegaly. Patient's wheezing improved with scheduled meds and antibiotics.  History of atrial fibrillation/tachybradycardia syndrome:  Rate controlled, has a pacemaker, patient was placed on amiodarone, beta blocker. She is on Coumadin for chronic anticoagulation for A. Fib   Recent knee arthroplasty: patient did well with physical therapy, she will follow up with Dr. Charlann Boxer in 2 weeks     Day of Discharge BP 138/81  Pulse 88  Temp(Src) 97.8 F (36.6 C) (Oral)  Resp 18  Ht 5\' 1"  (1.549 m)  Wt 68.9 kg (151  lb 14.4 oz)  BMI 28.70 kg/m2  SpO2 96%  Physical Exam: General: Alert and awake oriented x3 not in any acute distress. HEENT: anicteric sclera, pupils reactive to light and accommodation CVS: S1-S2 clear no murmur rubs or gallops Chest: clear to auscultation bilaterally, no wheezing rales or rhonchi Abdomen: soft nontender, nondistended, normal bowel sounds, no organomegaly Extremities: no cyanosis, clubbing or edema noted bilaterally Neuro: Cranial nerves II-XII intact, no focal neurological deficits   The results of significant diagnostics from this hospitalization (including imaging, microbiology, ancillary and laboratory) are listed below for reference.    LAB RESULTS: Basic Metabolic Panel:  Lab 02/11/12 1610 02/10/12 1250  NA 137 140  K 3.4* 3.5  CL 100 103  CO2 30 28  GLUCOSE 85 82  BUN 17 20  CREATININE 1.00 1.06  CALCIUM 8.2* 8.8  MG -- --  PHOS -- --   Liver Function Tests:  Lab 02/10/12 1250  AST 30  ALT 27  ALKPHOS 106  BILITOT 0.6  PROT 6.5  ALBUMIN 3.0*    Lab 02/10/12 1250  LIPASE 18  AMYLASE --   CBC:  Lab 02/11/12 0520 02/10/12 1250  WBC 6.9 12.8*  NEUTROABS -- 10.1*  HGB 10.2* 10.9*  HCT 31.4* 33.9*  MCV 89.7 --  PLT 151 165   Cardiac Enzymes:  Lab 02/11/12 0520 02/10/12 2340  CKTOTAL 196* 219*  CKMB 2.3 2.5  CKMBINDEX -- --  TROPONINI <0.30 <0.30   BNP: No components found with this basename: POCBNP:2 CBG: No results found for this basename: GLUCAP:2 in the last 168 hours  Significant Diagnostic Studies:  Ct Angio Chest W/cm &/or Wo Cm  02/10/2012   *RADIOLOGY REPORT*  Clinical Data: Shortness of breath.  Cough.  Wheezing.  Status post left knee replacement.  History uterine cancer 15 years ago. Radiation therapy complete.  Left-sided renal cell carcinoma; left nephrectomy.  CT ANGIOGRAPHY CHEST  Technique:  Multidetector CT imaging of the chest using the standard protocol during bolus administration of intravenous contrast. Multiplanar reconstructed images including MIPs were obtained and reviewed to evaluate the vascular anatomy.  Contrast: OMNIPAQUE IOHEXOL 300 MG/ML  SOLN  Comparison: Plain film of 02/10/2012.  No prior CT.  Findings: Lung windows demonstrate mild motion degradation throughout.  A subpleural lymph node along the right minor fissure on image 49 measures 4 mm.  Volume loss in the right middle lobe and dependent lung bases.  Mild septal thickening at the lung bases, primarily on the right.  Soft tissue windows:  The quality of this exam for evaluation of pulmonary embolism is good.  Bolus well timed. No evidence of pulmonary embolism.  Mild degradation secondary to pacer battery. Normal aortic caliber without dissection.  Bovine arch.  Moderate cardiomegaly with multivessel coronary artery atherosclerosis.  Small bilateral pleural effusions.  Pretracheal node measures 1.2 cm.  Azygo- esophageal recess node measures 1.5 cm.  Right hilar node measures1.6 cm.  There is nodal prominence along the bronchovascular interstitium bilaterally.  Limited abdominal imaging demonstrates small volume perihepatic ascites.  Pacer terminating at right atrium and right ventricle.No acute osseous abnormality.  IMPRESSION:  1. No evidence of pulmonary embolism.  Mild motion degradation. 2.  Findings suspicious for congestive heart failure, with pleural effusions and cardiomegaly.  Small volume perihepatic ascites and mild septal thickening at the bases. 3.  Mild thoracic adenopathy.  This can be seen with congestive heart failure.  If confirmation of  stability or resolution is desired, CT follow-up at 3  months could be performed. 4.  Coronary artery atherosclerosis.  Original Report Authenticated By: Consuello Bossier, M.D.   Dg Chest Portable 1 View  02/10/2012  *RADIOLOGY REPORT*  Clinical Data: Shortness of breath and weakness.  PORTABLE CHEST - 1 VIEW  Comparison: 03/29/2011.  Findings: Interval mild enlargement of the cardiac silhouette and mild increase in prominence of the pulmonary vasculature.  The interstitial markings are also slightly more prominent with possible small bilateral pleural effusions.  Diffuse osteopenia. Stable right subclavian pacemaker leads.  IMPRESSION: Interval mild cardiomegaly and mild changes of congestive heart failure.  Original Report Authenticated By: Darrol Angel, M.D.     Disposition and Follow-up: Discharge Orders    Future Appointments: Provider: Department: Dept Phone: Center:   03/30/2012 8:50 AM [facility] [phone] [facility]   04/26/2012 8:30 AM Cassell Clement, MD [facility] [phone] None     Future Orders Please Complete By Expires   Diet - low sodium heart healthy      Increase activity slowly      (HEART FAILURE PATIENTS) Call MD:  Anytime you have any of the following symptoms: 1) 3 pound weight gain in 24 hours or 5 pounds in 1 week 2) shortness of breath, with or without a dry hacking cough 3) swelling in the hands, feet or stomach 4) if you have to sleep on extra pillows at night in order to breathe.      Discharge instructions      Comments:   DC instructions per ortho: Maintain surgical dressing for 8 days, then replace with gauze and tape. Keep the area dry and clean until follow up. Follow up in 2 weeks at [facility]. Call with any questions or concerns.  Coumadin to be monitored by home health nurse and pharmacy to obtain and maintain INR 2-3. Continue Lovenox until INR >2 CPM machine 0-20 as tolerated, DO NOT advance, 4  hours per day. PT for ambulation, passive stretching and limited ROM (0-20) [very limited flexion do to 60 years not moving the knee, not expecting to go further than 20]        DISPOSITION: Home  DIET: Heart healthy  ACTIVITY: As tolerated  TESTS THAT NEED FOLLOW-UP BMET, PT/INR on Monday  DISCHARGE FOLLOW-UP Follow-up Information    Follow up with Cassell Clement, MD. Schedule an appointment as soon as possible for a visit in 7 days. [facility] FOLLOW-UP, please get BMET, PT/INR)    Contact information:   [address] [place] [address] [phone]       Follow up with Shelda Pal, MD. Schedule an appointment as soon as possible for a visit in 2 weeks.   Contact information:   [facility] [address], [address] [place] [address] [phone]          Time spent on Discharge: 45 minutes  Signed:  Aracely Rickett M.D. [facility] 02/12/2012, 10:13 AM

## 2012-02-12 NOTE — Progress Notes (Signed)
Prepared for d/c home. Amb. In hall without O2, toll. Well. O2 sat after amb. 94% on r/a. All d/c instructions and rx's reviewed with pt and daughter with understanding verb.,  D/c home in good spirits. Denies concerns.

## 2012-02-12 NOTE — Progress Notes (Signed)
ANTICOAGULATION CONSULT NOTE - Follow Up Consult  Pharmacy Consult for Warfarin Indication: atrial fibrillation and VTE prophylaxis  Allergies  Allergen Reactions  . Codeine Nausea And Vomiting  . Zinc     nausea    Patient Measurements: Height: 5\' 1"  (154.9 cm) Weight: 151 lb 14.4 oz (68.9 kg) (bed scale) IBW/kg (Calculated) : 47.8   Vital Signs: Temp: 97.8 F (36.6 C) (06/08 0522) Temp src: Oral (06/08 0522) BP: 138/81 mmHg (06/08 0522) Pulse Rate: 88  (06/08 0522)  Labs:  Alvira Philips 02/12/12 0558 02/11/12 0520 02/10/12 2340 02/10/12 1830 02/10/12 1250  HGB -- 10.2* -- -- 10.9*  HCT -- 31.4* -- -- 33.9*  PLT -- 151 -- -- 165  APTT -- -- -- -- --  LABPROT 21.1* 21.7* -- -- 23.8*  INR 1.79* 1.85* -- -- 2.09*  HEPARINUNFRC -- -- -- -- --  CREATININE -- 1.00 -- -- 1.06  CKTOTAL -- 196* 219* 261* --  CKMB -- 2.3 2.5 3.1 --  TROPONINI -- <0.30 <0.30 <0.30 --    Estimated Creatinine Clearance: 42.5 ml/min (by C-G formula based on Cr of 1).   Medications:  Scheduled:     . albuterol  2.5 mg Nebulization BID  . amiodarone  200 mg Oral QPM  . amoxicillin-clavulanate  1 tablet Oral Q12H  . atenolol  100 mg Oral BID  . atorvastatin  20 mg Oral Custom  . calcium-vitamin D  1 tablet Oral Daily  . docusate sodium  100 mg Oral BID  . ferrous sulfate  325 mg Oral TID PC  . furosemide  40 mg Intravenous Daily  . losartan  100 mg Oral Daily   And  . hydrochlorothiazide  25 mg Oral Daily  . HYDROcodone-acetaminophen  1-2 tablet Oral Q4H  . ipratropium  0.5 mg Nebulization BID  . levothyroxine  50 mcg Oral QAC breakfast  . omega-3 acid ethyl esters  1 g Oral Daily  . potassium chloride  40 mEq Oral Daily  . pregabalin  75 mg Oral BID  . sertraline  100 mg Oral QPM  . sodium chloride  3 mL Intravenous Q12H  . vitamin C  500 mg Oral Daily  . warfarin  2.5 mg Oral Once  . Warfarin - Pharmacist Dosing Inpatient   Does not apply q1800  . DISCONTD: albuterol  2.5 mg  Nebulization Q6H WA  . DISCONTD: ipratropium  0.5 mg Nebulization Q6H WA   Infusions:    Assessment:  Patient takes warfarin chronically for Afib at a dose of 2mg  daily except 1mg  on Thursdays and also s/p TKA on 6/3  INR subtherapeutic again today - will use higher dose tonight  Currently off of Lovenox due to orders to d/c when INR > 1.8 (INR is 1.79, essentially 1.8)  No reported bleeding per notes  Note patient also on amiodarone  Goal of Therapy:  INR 2-3   Plan:   Warfarin 5mg  PO x1  Daily INR  Will need to start Lovenox again if INR drops tomorrow  Darrol Angel, PharmD Pager: [phone] 02/12/2012 9:36 AM

## 2012-02-12 NOTE — Care Management (Signed)
Cm contacted [facility] to alert agency of pt's discharge at [phone]. CM to fax MD orders & dc summary to agency.   [name] [phone]

## 2012-02-12 NOTE — Progress Notes (Signed)
02/12/12 1205  PT Visit Information  Last PT Received On 02/12/12  PT Time Calculation  PT Start Time 1150  PT Stop Time 1205  PT Time Calculation (min) 15 min  Subjective Data  Subjective No new complaints, Agreeable to therapy before going home today.  Precautions  Precautions Knee  Restrictions  RLE Weight Bearing WBAT  Cognition  Overall Cognitive Status Appears within functional limits for tasks assessed/performed  Arousal/Alertness Awake/alert  Orientation Level Appears intact for tasks assessed  Behavior During Session [facility] for tasks performed  Bed Mobility  Bed Mobility Sit to Supine  Sit to Supine 5: Supervision  Details for Bed Mobility Assistance min verbal cues for safety.  Transfers  Sit to Stand 5: Supervision;With upper extremity assist;From bed  Stand to Sit 5: Supervision;With upper extremity assist;To bed  Details for Transfer Assistance cues for safer hand placement with transfers  Ambulation/Gait  Ambulation/Gait Assistance 5: Supervision  Ambulation Distance (Feet) 100 Feet  Assistive device Rolling walker  Ambulation/Gait Assistance Details cues to not get too close to walker front and to stay with walker with turns  Gait Pattern Step-through pattern;Decreased stride length  Total Joint Exercises  Ankle Circles/Pumps AROM;Both;10 reps;Supine  Quad Sets AROM;Strengthening;Both;10 reps;Supine  Heel Slides Strengthening;Right;10 reps;Supine;AROM  Hip ABduction/ADduction AROM;Strengthening;Right;10 reps;Supine  Straight Leg Raises AROM;Strengthening;Right;10 reps;Supine  PT - End of Session  Equipment Utilized During Treatment Gait belt  Activity Tolerance Patient tolerated treatment well  Patient left in bed;with call bell/phone within reach;with family/visitor present  Nurse Communication Mobility status  PT - Assessment/Plan  Comments on Treatment Session Good progress with increased activity performed this session  PT Plan Discharge plan remains  appropriate;Frequency remains appropriate  PT Frequency Min 6X/week  Follow Up Recommendations Home health PT  Equipment Recommended None recommended by PT  Acute Rehab PT Goals  PT Goal: Supine/Side to Sit - Progress Progressing toward goal  PT Goal: Sit to Stand - Progress Progressing toward goal  PT Goal: Ambulate - Progress Progressing toward goal    Sallyanne Kuster, PTA Office- [phone]

## 2012-02-16 ENCOUNTER — Ambulatory Visit (INDEPENDENT_AMBULATORY_CARE_PROVIDER_SITE_OTHER): Payer: 59 | Admitting: Cardiology

## 2012-02-16 ENCOUNTER — Encounter: Payer: Self-pay | Admitting: Cardiology

## 2012-02-16 VITALS — BP 100/68 | HR 64 | Ht 61.0 in

## 2012-02-16 DIAGNOSIS — Z96659 Presence of unspecified artificial knee joint: Secondary | ICD-10-CM

## 2012-02-16 DIAGNOSIS — I509 Heart failure, unspecified: Secondary | ICD-10-CM

## 2012-02-16 DIAGNOSIS — I5023 Acute on chronic systolic (congestive) heart failure: Secondary | ICD-10-CM

## 2012-02-16 LAB — BASIC METABOLIC PANEL
BUN: 24 mg/dL — ABNORMAL HIGH (ref 6–23)
CO2: 26 mEq/L (ref 19–32)
Chloride: 99 mEq/L (ref 96–112)
Glucose, Bld: 96 mg/dL (ref 70–99)
Potassium: 3.7 mEq/L (ref 3.5–5.1)

## 2012-02-16 NOTE — Patient Instructions (Addendum)
Decrease your Lasix to 40 mg daily on Mon, Wed, Fri, and Sat only, none the other days  Will obtain labs today and call you with the results   Keep your August appointment

## 2012-02-16 NOTE — Assessment & Plan Note (Signed)
The patient has had a good response to taking Lasix 40 mg every day.  He is no longer having any dyspnea or wheezing.  Prior to going in the hospital she had been taking Lasix just 3 days a week

## 2012-02-16 NOTE — Assessment & Plan Note (Signed)
The patient has not been aware of any atrial fibrillation or palpitations.

## 2012-02-16 NOTE — Progress Notes (Signed)
[name] Date of Birth:  1934-01-12 [facility] [address] [place], [place]  [address] [phone]         Fax   [phone]  History of Present Illness: This pleasant 78 year old woman is seen for a scheduled followup office visit .he has a past history of tachybradycardia syndrome .  She has a permanent pacemaker implanted 07/01/10 .  Since we last saw her she has had right total knee replacement.  Postoperatively after going home she developed increasing dyspnea and wheezing and had to be readmitted with exacerbation of acute on chronic systolic congestive heart failure.  The exacerbation of congestive heart failure was felt to be secondary to not having taken enough Lasix postoperative.  Current Outpatient Prescriptions  Medication Sig Dispense Refill  . albuterol (PROVENTIL HFA;VENTOLIN HFA) 108 (90 BASE) MCG/ACT inhaler Inhale 1 puff into the lungs every 6 (six) hours as needed for wheezing or shortness of breath.  1 Inhaler  3  . amiodarone (PACERONE) 200 MG tablet Take 200 mg by mouth every evening.       [name] amoxicillin-clavulanate (AUGMENTIN) 500-125 MG per tablet Take 1 tablet (500 mg total) by mouth every 12 (twelve) hours. X 5 days  10 tablet  0  . Ascorbic Acid (VITAMIN C PO) Take 500 mg by mouth daily.       [name] atenolol (TENORMIN) 100 MG tablet Take 100 mg by mouth 2 (two) times daily.       [name] atorvastatin (LIPITOR) 40 MG tablet Take 20 mg by mouth See admin instructions. Takes 1 tablet Monday-Friday      . calcium-vitamin D (OSCAL WITH D) 500-200 MG-UNIT per tablet Take 1 tablet by mouth daily.      . Cyanocobalamin (B-12 PO) Take 1,000 mg by mouth daily.       . diphenhydrAMINE (BENADRYL) 25 mg capsule Take 1 capsule (25 mg total) by mouth every 6 (six) hours as needed for itching, allergies or sleep.  30 capsule    . Docusate Sodium (DSS) 100 MG CAPS Take 100 mg by mouth 2 (two) times daily.      . ferrous sulfate 325 (65 FE) MG tablet Take 1  tablet (325 mg total) by mouth 3 (three) times daily after meals.      . furosemide (LASIX) 40 MG tablet Take 40 mg by mouth as directed. 1 tablet Mon, Wed, Fri, and Sat only.  None the other days      . HYDROcodone-acetaminophen (NORCO) 7.5-325 MG per tablet Take 1-2 tablets by mouth every 4 (four) hours.  120 tablet  0  . Hyoscyamine Sulfate (HYOMAX-DT) 0.375 MG TBCR Take one tab twice a day as needed for abdominal pain  25 each  1  . levothyroxine (SYNTHROID, LEVOTHROID) 100 MCG tablet Take 50 mcg by mouth See admin instructions. Take 1/2 tab daily except for saturdays and sundays      . losartan-hydrochlorothiazide (HYZAAR) 100-25 MG per tablet Take 1 tablet by mouth daily.       [name] MAGNESIUM PO Take 30 mg by mouth every Monday, Wednesday, and Friday.       . methocarbamol (ROBAXIN) 500 MG tablet Take 1 tablet (500 mg total) by mouth every 6 (six) hours as needed.      . nitroGLYCERIN (NITRODUR - DOSED IN MG/24 HR) 0.4 mg/hr Place 1 patch onto the skin as needed. CHEST PAINS      . nitroGLYCERIN (NITROSTAT) 0.4 MG SL tablet Place 0.4 mg  under the tongue every 5 (five) minutes as needed. CHEST PAINS      . omega-3 acid ethyl esters (LOVAZA) 1 G capsule Take 1 g by mouth daily.      . potassium chloride SA (K-DUR,KLOR-CON) 20 MEQ tablet Take 1 tablet (20 mEq total) by mouth 3 (three) times daily.  270 tablet  3  . pregabalin (LYRICA) 75 MG capsule Take 75 mg by mouth 2 (two) times daily.       . sertraline (ZOLOFT) 100 MG tablet Take 100 mg by mouth every evening.       . warfarin (COUMADIN) 2 MG tablet Take 1-3 mg by mouth See admin instructions. Take 2mg  every night except Thursday take 1mg       . DISCONTD: furosemide (LASIX) 40 MG tablet Take 1 tablet (40 mg total) by mouth daily. Dose to be adjusted by Dr. Patty Sermons  30 tablet  0    Allergies  Allergen Reactions  . Codeine Nausea And Vomiting  . Zinc     nausea    Patient Active Problem List  Diagnosis  . RENAL CELL CANCER  . BENIGN  NEOPLASM OTH&UNSPEC SITE DIGESTIVE SYSTEM  . HYPOTHYROIDISM  . HYPERLIPIDEMIA  . ANXIETY  . DEPRESSION  . RESTLESS LEG SYNDROME  . HYPERTENSION  . Gastroparesis  . RADIATION PROCTITIS  . FIBROCYSTIC BREAST DISEASE  . OSTEOMYELITIS  . CARCINOMA, UTERUS, HX OF  . RADIATION THERAPY, HX OF  . Atrial fibrillation  . Tachycardia-bradycardia syndrome  . S/P repair of patent ductus arteriosus  . Osteoarthritis  . Aortic stenosis, moderate  . Family history of malignant neoplasm of gastrointestinal tract  . Benign neoplasm of stomach  . Diarrhea  . S/P right TKA  . Acute respiratory failure with hypoxia  . S/P total knee arthroplasty  . CHF, acute on chronic    History  Smoking status  . Never Smoker   Smokeless tobacco  . Never Used    History  Alcohol Use No    Family History  Problem Relation Age of Onset  . Colon cancer Mother 37  . Heart disease Mother   . Heart disease Father   . Heart attack Father   . Heart disease Maternal Grandmother     Review of Systems: Constitutional: no fever chills diaphoresis or fatigue or change in weight.  Head and neck: no hearing loss, no epistaxis, no photophobia or visual disturbance. Respiratory: No cough, shortness of breath or wheezing. Cardiovascular: No chest pain peripheral edema, palpitations. Gastrointestinal: No abdominal distention, no abdominal pain, no change in bowel habits hematochezia or melena. Genitourinary: No dysuria, no frequency, no urgency, no nocturia. Musculoskeletal:No arthralgias, no back pain, no gait disturbance or myalgias. Neurological: No dizziness, no headaches, no numbness, no seizures, no syncope, no weakness, no tremors. Hematologic: No lymphadenopathy, no easy bruising. Psychiatric: No confusion, no hallucinations, no sleep disturbance.    Physical Exam: Filed Vitals:   02/16/12 0851  BP: 100/68  Pulse: 64   the general appearance reveals a well-developed well-nourished woman in no  distress.The head and neck exam reveals pupils equal and reactive.  Extraocular movements are full.  There is no scleral icterus.  The mouth and pharynx are normal.  The neck is supple.  The carotids reveal no bruits.  The jugular venous pressure is normal.  The  thyroid is not enlarged.  There is no lymphadenopathy.  The chest is clear to percussion and auscultation.  There are no rales or rhonchi.  Expansion of  the chest is symmetrical.  The precordium is quiet.  The first heart sound is normal.  The second heart sound is physiologically split.  There is no murmur gallop rub or click.  There is no abnormal lift or heave.  The abdomen is soft and nontender.  The bowel sounds are normal.  The liver and spleen are not enlarged.  There are no abdominal masses.  There are no abdominal bruits.  Extremities reveal good pedal pulses.  There is no phlebitis or edema.  There is no cyanosis or clubbing.  Strength is normal and symmetrical in all extremities.  The right leg is in a brace. There is no lateralizing weakness.  There are no sensory deficits.  The skin is warm and dry.  There is no rash.     Assessment / Plan: Her blood pressure is low normal today.  She is back to dry weight and we will decrease her furosemide to just Monday Wednesday Friday and Saturday.  We are checking a basal metabolic panel and B. natruretic peptide today.  She'll be rechecked at her regular visit later this summer or sooner when necessary.  She will continue Coumadin followup in the Coumadin clinic.

## 2012-02-16 NOTE — Assessment & Plan Note (Signed)
The patient arrived in a wheelchair today.  She is making a good early recovery following her right total knee arthroplasty.

## 2012-02-17 LAB — CULTURE, BLOOD (ROUTINE X 2)
Culture  Setup Time: 201306070139
Culture  Setup Time: 201306070139
Culture: NO GROWTH

## 2012-02-18 ENCOUNTER — Telehealth: Payer: Self-pay | Admitting: *Deleted

## 2012-02-18 NOTE — Telephone Encounter (Signed)
Message copied by Burnell Blanks on Fri Feb 18, 2012  6:58 PM ------      Message from: Cassell Clement      Created: Wed Feb 16, 2012  3:10 PM       Blood tests show that daily lasix is too strong for her now.  Decrease to 4/7 days as we discussed.

## 2012-02-18 NOTE — Telephone Encounter (Signed)
Advised of labs 

## 2012-02-29 ENCOUNTER — Telehealth: Payer: Self-pay | Admitting: Cardiology

## 2012-02-29 NOTE — Telephone Encounter (Signed)
Wants to discuss pt medication [name] wants to make sure she is taking everything properly

## 2012-02-29 NOTE — Telephone Encounter (Signed)
LMTCB Debbie Lillar Bianca RN  

## 2012-03-02 NOTE — Telephone Encounter (Signed)
Follow-up:    Called back again wanting a call back.  Please call back and leave a good time to call back.

## 2012-03-02 NOTE — Telephone Encounter (Signed)
Left message to call back  

## 2012-03-07 NOTE — Telephone Encounter (Signed)
Never heard back.

## 2012-03-08 ENCOUNTER — Telehealth: Payer: Self-pay | Admitting: *Deleted

## 2012-03-08 NOTE — Telephone Encounter (Signed)
Reviewed medications with [facility] nurse

## 2012-03-21 ENCOUNTER — Ambulatory Visit (INDEPENDENT_AMBULATORY_CARE_PROVIDER_SITE_OTHER): Payer: 59

## 2012-03-21 DIAGNOSIS — I4891 Unspecified atrial fibrillation: Secondary | ICD-10-CM

## 2012-03-29 ENCOUNTER — Ambulatory Visit (INDEPENDENT_AMBULATORY_CARE_PROVIDER_SITE_OTHER): Payer: 59 | Admitting: *Deleted

## 2012-03-29 DIAGNOSIS — I4891 Unspecified atrial fibrillation: Secondary | ICD-10-CM

## 2012-03-30 ENCOUNTER — Encounter: Payer: Self-pay | Admitting: Internal Medicine

## 2012-03-30 ENCOUNTER — Ambulatory Visit (INDEPENDENT_AMBULATORY_CARE_PROVIDER_SITE_OTHER): Payer: 59 | Admitting: *Deleted

## 2012-03-30 DIAGNOSIS — I495 Sick sinus syndrome: Secondary | ICD-10-CM

## 2012-04-03 LAB — REMOTE PACEMAKER DEVICE
AL THRESHOLD: 0.375 V
RV LEAD AMPLITUDE: 22.4 mv
RV LEAD THRESHOLD: 0.625 V

## 2012-04-10 ENCOUNTER — Encounter: Payer: Self-pay | Admitting: *Deleted

## 2012-04-12 ENCOUNTER — Ambulatory Visit (INDEPENDENT_AMBULATORY_CARE_PROVIDER_SITE_OTHER): Payer: 59 | Admitting: *Deleted

## 2012-04-12 DIAGNOSIS — I4891 Unspecified atrial fibrillation: Secondary | ICD-10-CM

## 2012-04-12 LAB — POCT INR: INR: 2.9

## 2012-04-26 ENCOUNTER — Ambulatory Visit: Payer: 59 | Admitting: Cardiology

## 2012-04-27 ENCOUNTER — Encounter: Payer: Self-pay | Admitting: Cardiology

## 2012-04-27 ENCOUNTER — Ambulatory Visit (INDEPENDENT_AMBULATORY_CARE_PROVIDER_SITE_OTHER): Payer: 59

## 2012-04-27 ENCOUNTER — Ambulatory Visit (INDEPENDENT_AMBULATORY_CARE_PROVIDER_SITE_OTHER): Payer: Medicare Other | Admitting: Cardiology

## 2012-04-27 VITALS — BP 148/80 | HR 72 | Ht 61.0 in | Wt 146.1 lb

## 2012-04-27 DIAGNOSIS — Z79899 Other long term (current) drug therapy: Secondary | ICD-10-CM

## 2012-04-27 DIAGNOSIS — I119 Hypertensive heart disease without heart failure: Secondary | ICD-10-CM

## 2012-04-27 DIAGNOSIS — E785 Hyperlipidemia, unspecified: Secondary | ICD-10-CM

## 2012-04-27 DIAGNOSIS — I4891 Unspecified atrial fibrillation: Secondary | ICD-10-CM

## 2012-04-27 DIAGNOSIS — I10 Essential (primary) hypertension: Secondary | ICD-10-CM

## 2012-04-27 NOTE — Progress Notes (Signed)
[name] Date of Birth:  [birth year] [facility] [address] [place], [place]  [address] [phone]         Fax   [phone]  History of Present Illness:  This pleasant 78 year old woman is seen for a scheduled followup office visit .he has a past history of tachybradycardia syndrome . She has a permanent pacemaker implanted 07/01/10 . Since we last saw her she has had right total knee replacement. Postoperatively after going home she developed increasing dyspnea and wheezing and had to be readmitted with exacerbation of acute on chronic systolic congestive heart failure. The exacerbation of congestive heart failure was felt to be secondary to not having taken enough Lasix postoperative. Since last visit she has been chest pain or increased dyspnea.  She has not had to use any of her inhalers.  She has not been aware of any racing or tachycardia of her heart doing well.  She has not been experiencing any racing or tachycardia of her heart   Current Outpatient Prescriptions  Medication Sig Dispense Refill  . albuterol (PROVENTIL HFA;VENTOLIN HFA) 108 (90 BASE) MCG/ACT inhaler Inhale 1 puff into the lungs every 6 (six) hours as needed for wheezing or shortness of breath.  1 Inhaler  3  . amiodarone (PACERONE) 200 MG tablet Take 200 mg by mouth every evening.       [name] atenolol (TENORMIN) 100 MG tablet Take 100 mg by mouth 2 (two) times daily.       [name] atorvastatin (LIPITOR) 40 MG tablet Take 20 mg by mouth See admin instructions. Takes 1 tablet Monday-Friday      . calcium-vitamin D (OSCAL WITH D) 500-200 MG-UNIT per tablet Take 1 tablet by mouth daily.      . furosemide (LASIX) 40 MG tablet Take 40 mg by mouth as directed. 1 tablet Mon, Wed, Fri, and Sat only.  None the other days      . Hyoscyamine Sulfate (HYOMAX-DT) 0.375 MG TBCR Take one tab twice a day as needed for abdominal pain  25 each  1  . levothyroxine (SYNTHROID, LEVOTHROID) 100 MCG tablet Take 50 mcg  by mouth See admin instructions. Take 1/2 tab daily except for saturdays and sundays      . losartan-hydrochlorothiazide (HYZAAR) 100-25 MG per tablet Take 1 tablet by mouth daily.       . nitroGLYCERIN (NITRODUR - DOSED IN MG/24 HR) 0.4 mg/hr Place 1 patch onto the skin as needed. CHEST PAINS      . nitroGLYCERIN (NITROSTAT) 0.4 MG SL tablet Place 0.4 mg under the tongue every 5 (five) minutes as needed. CHEST PAINS      . potassium chloride SA (K-DUR,KLOR-CON) 20 MEQ tablet Take 1 tablet (20 mEq total) by mouth 3 (three) times daily.  270 tablet  3  . pregabalin (LYRICA) 75 MG capsule Take 75 mg by mouth 2 (two) times daily.       . sertraline (ZOLOFT) 100 MG tablet Take 100 mg by mouth every evening.       . warfarin (COUMADIN) 2 MG tablet Take 1-3 mg by mouth See admin instructions. Take 2mg  every night except Thursday take 1mg       . omega-3 acid ethyl esters (LOVAZA) 1 G capsule Take 1 g by mouth daily.        Allergies  Allergen Reactions  . Codeine Nausea And Vomiting  . Zinc     nausea    Patient Active Problem List  Diagnosis  .  RENAL CELL CANCER  . BENIGN NEOPLASM OTH&UNSPEC SITE DIGESTIVE SYSTEM  . HYPOTHYROIDISM  . HYPERLIPIDEMIA  . ANXIETY  . DEPRESSION  . RESTLESS LEG SYNDROME  . HYPERTENSION  . Gastroparesis  . RADIATION PROCTITIS  . FIBROCYSTIC BREAST DISEASE  . OSTEOMYELITIS  . CARCINOMA, UTERUS, HX OF  . RADIATION THERAPY, HX OF  . Atrial fibrillation  . Tachycardia-bradycardia syndrome  . S/P repair of patent ductus arteriosus  . Osteoarthritis  . Aortic stenosis, moderate  . Family history of malignant neoplasm of gastrointestinal tract  . Benign neoplasm of stomach  . Diarrhea  . S/P right TKA  . Acute respiratory failure with hypoxia  . S/P total knee arthroplasty  . CHF, acute on chronic    History  Smoking status  . Never Smoker   Smokeless tobacco  . Never Used    History  Alcohol Use No    Family History  Problem Relation Age of  Onset  . Colon cancer Mother 7  . Heart disease Mother   . Heart disease Father   . Heart attack Father   . Heart disease Maternal Grandmother     Review of Systems: Constitutional: no fever chills diaphoresis or fatigue or change in weight.  Head and neck: no hearing loss, no epistaxis, no photophobia or visual disturbance. Respiratory: No cough, shortness of breath or wheezing. Cardiovascular: No chest pain peripheral edema, palpitations. Gastrointestinal: No abdominal distention, no abdominal pain, no change in bowel habits hematochezia or melena. Genitourinary: No dysuria, no frequency, no urgency, no nocturia. Musculoskeletal:No arthralgias, no back pain, no gait disturbance or myalgias. Neurological: No dizziness, no headaches, no numbness, no seizures, no syncope, no weakness, no tremors. Hematologic: No lymphadenopathy, no easy bruising. Psychiatric: No confusion, no hallucinations, no sleep disturbance.    Physical Exam: Filed Vitals:   04/27/12 0833  BP: 148/80  Pulse: 72   the general appearance reveals a well-developed well-nourished alert woman in no distress.The head and neck exam reveals pupils equal and reactive.  Extraocular movements are full.  There is no scleral icterus.  The mouth and pharynx are normal.  The neck is supple.  The carotids reveal no bruits.  The jugular venous pressure is normal.  The  thyroid is not enlarged.  There is no lymphadenopathy.  The chest is clear to percussion and auscultation.  There are no rales or rhonchi.  Expansion of the chest is symmetrical.  The precordium is quiet.  The first heart sound is normal.  The second heart sound is physiologically split.  There is no gallop rub or click.  There is a soft systolic ejection murmur at the aortic area.  No diastolic murmur. There is no abnormal lift or heave.  The abdomen is soft and nontender.  The bowel sounds are normal.  The liver and spleen are not enlarged.  There are no abdominal  masses.  There are no abdominal bruits.  Extremities reveal good pedal pulses.  There is no phlebitis or edema.  There is no cyanosis or clubbing.  Strength is normal and symmetrical in all extremities.  There is no lateralizing weakness.  There are no sensory deficits.  The skin is warm and dry.  There is no rash.     Assessment / Plan: The patient is to continue same medication.  Recheck in 4 months for followup office visit EKG lipid panel hepatic function panel and basal metabolic panel CBC and TSH to followup on long-term amiodarone

## 2012-04-27 NOTE — Patient Instructions (Addendum)
Your physician recommends that you continue on your current medications as directed. Please refer to the Current Medication list given to you today.  Your physician recommends that you schedule a follow-up appointment in: 4 months with fasting labs (lp/bmet/hfp/cbc/tsh) and ekg

## 2012-04-27 NOTE — Assessment & Plan Note (Signed)
Blood pressure was remaining stable on current therapy.  No headaches.  No dizziness.

## 2012-04-27 NOTE — Assessment & Plan Note (Signed)
Patient is on Lipitor 40 mg daily.  She is not having any myalgias.

## 2012-04-27 NOTE — Assessment & Plan Note (Signed)
She is maintaining normal sinus rhythm clinically on low-dose amiodarone.  She is not having any symptoms of hypothyroidism.  We will plan to repeat her TSH and EKG at her next visit

## 2012-05-15 ENCOUNTER — Ambulatory Visit (INDEPENDENT_AMBULATORY_CARE_PROVIDER_SITE_OTHER): Payer: 59 | Admitting: *Deleted

## 2012-05-15 DIAGNOSIS — I4891 Unspecified atrial fibrillation: Secondary | ICD-10-CM

## 2012-05-17 ENCOUNTER — Ambulatory Visit: Payer: 59 | Attending: Orthopedic Surgery | Admitting: Physical Therapy

## 2012-05-17 DIAGNOSIS — M545 Low back pain, unspecified: Secondary | ICD-10-CM | POA: Insufficient documentation

## 2012-05-17 DIAGNOSIS — M2560 Stiffness of unspecified joint, not elsewhere classified: Secondary | ICD-10-CM | POA: Insufficient documentation

## 2012-05-17 DIAGNOSIS — R269 Unspecified abnormalities of gait and mobility: Secondary | ICD-10-CM | POA: Insufficient documentation

## 2012-05-17 DIAGNOSIS — M6281 Muscle weakness (generalized): Secondary | ICD-10-CM | POA: Insufficient documentation

## 2012-05-17 DIAGNOSIS — IMO0001 Reserved for inherently not codable concepts without codable children: Secondary | ICD-10-CM | POA: Insufficient documentation

## 2012-05-25 ENCOUNTER — Ambulatory Visit: Payer: 59 | Admitting: Physical Therapy

## 2012-05-26 ENCOUNTER — Ambulatory Visit: Payer: 59 | Admitting: Physical Therapy

## 2012-05-31 ENCOUNTER — Ambulatory Visit: Payer: 59

## 2012-06-02 ENCOUNTER — Ambulatory Visit: Payer: 59 | Admitting: Physical Therapy

## 2012-06-05 ENCOUNTER — Ambulatory Visit: Payer: 59 | Admitting: Physical Therapy

## 2012-06-07 ENCOUNTER — Ambulatory Visit (INDEPENDENT_AMBULATORY_CARE_PROVIDER_SITE_OTHER): Payer: 59 | Admitting: *Deleted

## 2012-06-07 DIAGNOSIS — I4891 Unspecified atrial fibrillation: Secondary | ICD-10-CM

## 2012-06-07 LAB — POCT INR: INR: 2.1

## 2012-06-08 ENCOUNTER — Ambulatory Visit: Payer: 59 | Attending: Orthopedic Surgery | Admitting: Physical Therapy

## 2012-06-08 DIAGNOSIS — R269 Unspecified abnormalities of gait and mobility: Secondary | ICD-10-CM | POA: Insufficient documentation

## 2012-06-08 DIAGNOSIS — M2560 Stiffness of unspecified joint, not elsewhere classified: Secondary | ICD-10-CM | POA: Insufficient documentation

## 2012-06-08 DIAGNOSIS — M6281 Muscle weakness (generalized): Secondary | ICD-10-CM | POA: Insufficient documentation

## 2012-06-08 DIAGNOSIS — IMO0001 Reserved for inherently not codable concepts without codable children: Secondary | ICD-10-CM | POA: Insufficient documentation

## 2012-06-08 DIAGNOSIS — M545 Low back pain, unspecified: Secondary | ICD-10-CM | POA: Insufficient documentation

## 2012-06-12 ENCOUNTER — Ambulatory Visit: Payer: 59 | Admitting: Physical Therapy

## 2012-06-15 ENCOUNTER — Encounter: Payer: Medicare Other | Admitting: Physical Therapy

## 2012-06-16 ENCOUNTER — Ambulatory Visit: Payer: 59 | Admitting: Physical Therapy

## 2012-07-03 ENCOUNTER — Encounter: Payer: 59 | Admitting: *Deleted

## 2012-07-05 ENCOUNTER — Ambulatory Visit (INDEPENDENT_AMBULATORY_CARE_PROVIDER_SITE_OTHER): Payer: Medicare Other | Admitting: *Deleted

## 2012-07-05 DIAGNOSIS — I4891 Unspecified atrial fibrillation: Secondary | ICD-10-CM

## 2012-07-11 ENCOUNTER — Encounter: Payer: Self-pay | Admitting: *Deleted

## 2012-07-13 ENCOUNTER — Telehealth: Payer: Self-pay | Admitting: Gastroenterology

## 2012-07-13 DIAGNOSIS — R109 Unspecified abdominal pain: Secondary | ICD-10-CM

## 2012-07-13 NOTE — Telephone Encounter (Signed)
Discussed with Mike Gip, PA.  OV scheduled on 07/14/12 at 1:30 PM. KUB 2 view r/o partial obstruction, CBC, Bmet. Spoke with patient and gave her appointments. She will stay on clear liquids until OV.

## 2012-07-13 NOTE — Telephone Encounter (Signed)
Spoke with patient and she states she usually has a bowel movement after every meal. She has not had a bowel movement since Monday.Monday, she had abdominal pain in the middle of her abdomen and vomiting. She is not vomiting now but does have nausea. When she eats, she feels pressure in her abdomen. She has taken 2 pain pills. States her stomach is hard and she feels bloated. She is not passing gas. She is c/o belching. Denies fever. States yesterday, she had a tiny stool that was not soft or hard and water like stuff came out after it. Patient instructed to go on clear liquids only until she hears back from me. Please, advise.

## 2012-07-14 ENCOUNTER — Other Ambulatory Visit (INDEPENDENT_AMBULATORY_CARE_PROVIDER_SITE_OTHER): Payer: 59

## 2012-07-14 ENCOUNTER — Ambulatory Visit (INDEPENDENT_AMBULATORY_CARE_PROVIDER_SITE_OTHER): Payer: 59 | Admitting: Physician Assistant

## 2012-07-14 ENCOUNTER — Encounter: Payer: Self-pay | Admitting: Physician Assistant

## 2012-07-14 ENCOUNTER — Ambulatory Visit (INDEPENDENT_AMBULATORY_CARE_PROVIDER_SITE_OTHER)
Admission: RE | Admit: 2012-07-14 | Discharge: 2012-07-14 | Disposition: A | Discharge status: Home / Self Care | Payer: 59 | Source: Ambulatory Visit | Attending: Physician Assistant | Admitting: Physician Assistant

## 2012-07-14 VITALS — BP 142/80 | HR 88 | Ht 61.0 in | Wt 145.2 lb

## 2012-07-14 DIAGNOSIS — Z8542 Personal history of malignant neoplasm of other parts of uterus: Secondary | ICD-10-CM

## 2012-07-14 DIAGNOSIS — C649 Malignant neoplasm of unspecified kidney, except renal pelvis: Secondary | ICD-10-CM

## 2012-07-14 DIAGNOSIS — K56600 Partial intestinal obstruction, unspecified as to cause: Secondary | ICD-10-CM

## 2012-07-14 DIAGNOSIS — R109 Unspecified abdominal pain: Secondary | ICD-10-CM

## 2012-07-14 DIAGNOSIS — K56609 Unspecified intestinal obstruction, unspecified as to partial versus complete obstruction: Secondary | ICD-10-CM

## 2012-07-14 LAB — BASIC METABOLIC PANEL
BUN: 21 mg/dL (ref 6–23)
Chloride: 104 mEq/L (ref 96–112)
Glucose, Bld: 85 mg/dL (ref 70–99)
Potassium: 3.5 mEq/L (ref 3.5–5.1)
Sodium: 141 mEq/L (ref 135–145)

## 2012-07-14 LAB — CBC WITH DIFFERENTIAL/PLATELET
Basophils Absolute: 0 10*3/uL (ref 0.0–0.1)
Eosinophils Absolute: 0.2 10*3/uL (ref 0.0–0.7)
Eosinophils Relative: 2.9 % (ref 0.0–5.0)
HCT: 42.3 % (ref 36.0–46.0)
Hemoglobin: 14 g/dL (ref 12.0–15.0)
MCV: 91 fl (ref 78.0–100.0)
Monocytes Relative: 8.7 % (ref 3.0–12.0)
Neutro Abs: 4.2 10*3/uL (ref 1.4–7.7)
Platelets: 178 10*3/uL (ref 150.0–400.0)
WBC: 7 10*3/uL (ref 4.5–10.5)

## 2012-07-14 NOTE — Progress Notes (Signed)
Subjective:    Patient ID: [name], female    DOB: September 03, 1934, 78 y.o.   MRN: [id number]  HPI   Chole is a very nice 78 year old white female known to Dr. Arlyce Dice who has multiple medical problems, and was last seen in December of 2012 at which time she had colonoscopy., She was found to have left colon diverticulosis no polyps. She has history of chronic atrial fibrillation, is on Coumadin. She also has tachybradycardia syndrome status post pacemaker placement, has history of coronary artery disease and has a personal history of uterine cancer for which she underwent a hysterectomy and then radiation therapy approximately 10 years ago. Subsequent to that she was also found to have a renal cell Ca, and underwent left nephrectomy. Patient states that ever since she had the abdominal radiation she has had chronic diarrhea and usually has 5-6 loose bowel movements per day. She had onset last week and on Sunday with fairly generalized mid abdominal pain which was constant and associated with a ventral abdominal distention nausea and vomiting. She says she had problems with vomiting this past Monday and Tuesday and was unable to keep down any by mouth was. She was also unable to have a bowel movement from Monday through  today. Not had any fever or chills. She called here yesterday and was advised to come in for labs today as well as plain abdominal films prior to this appointment. Abdominal films are unremarkable with a normal bowel gas pattern and her labs are pending. Over the past 24 hours she has started feeling better and was able to keep down some food yesterday she still feels somewhat nauseated. She is feeling less distended and actually had a liquid yellow bowel movement this morning.    Review of Systems  Constitutional: Positive for activity change and appetite change.  HENT: Negative.   Eyes: Negative.   Respiratory: Negative.   Cardiovascular: Negative.   Gastrointestinal: Positive  for nausea, vomiting, abdominal pain, constipation and abdominal distention.  Genitourinary: Negative.   Musculoskeletal: Negative.   Neurological: Negative.   Hematological: Negative.   Psychiatric/Behavioral: Negative.    Outpatient Prescriptions Prior to Visit  Medication Sig Dispense Refill  . albuterol (PROVENTIL HFA;VENTOLIN HFA) 108 (90 BASE) MCG/ACT inhaler Inhale 1 puff into the lungs every 6 (six) hours as needed for wheezing or shortness of breath.  1 Inhaler  3  . amiodarone (PACERONE) 200 MG tablet Take 200 mg by mouth every evening.       [name] atenolol (TENORMIN) 100 MG tablet Take 100 mg by mouth 2 (two) times daily.       [name] atorvastatin (LIPITOR) 40 MG tablet Take 20 mg by mouth See admin instructions. Takes 1 tablet Monday-Friday      . calcium-vitamin D (OSCAL WITH D) 500-200 MG-UNIT per tablet Take 1 tablet by mouth daily.      . furosemide (LASIX) 40 MG tablet Take 40 mg by mouth as directed. 1 tablet Mon, Wed, Fri, and Sat only.  None the other days      . Hyoscyamine Sulfate (HYOMAX-DT) 0.375 MG TBCR Take one tab twice a day as needed for abdominal pain  25 each  1  . levothyroxine (SYNTHROID, LEVOTHROID) 100 MCG tablet Take 50 mcg by mouth See admin instructions. Take 1/2 tab daily except for saturdays and sundays      . losartan-hydrochlorothiazide (HYZAAR) 100-25 MG per tablet Take 1 tablet by mouth daily.       . nitroGLYCERIN (  NITRODUR - DOSED IN MG/24 HR) 0.4 mg/hr Place 1 patch onto the skin as needed. CHEST PAINS      . nitroGLYCERIN (NITROSTAT) 0.4 MG SL tablet Place 0.4 mg under the tongue every 5 (five) minutes as needed. CHEST PAINS      . potassium chloride SA (K-DUR,KLOR-CON) 20 MEQ tablet Take 1 tablet (20 mEq total) by mouth 3 (three) times daily.  270 tablet  3  . pregabalin (LYRICA) 75 MG capsule Take 75 mg by mouth 2 (two) times daily.       . sertraline (ZOLOFT) 100 MG tablet Take 100 mg by mouth every evening.       . warfarin (COUMADIN) 2 MG tablet Take  1-3 mg by mouth See admin instructions. Take 2mg  every night except Thursday take 1mg       . omega-3 acid ethyl esters (LOVAZA) 1 G capsule Take 1 g by mouth daily.       Allergies  Allergen Reactions  . Codeine Nausea And Vomiting  . Zinc     nausea   Patient Active Problem List  Diagnosis  . RENAL CELL CANCER  . BENIGN NEOPLASM OTH&UNSPEC SITE DIGESTIVE SYSTEM  . HYPOTHYROIDISM  . HYPERLIPIDEMIA  . ANXIETY  . DEPRESSION  . RESTLESS LEG SYNDROME  . HYPERTENSION  . Gastroparesis  . RADIATION PROCTITIS  . FIBROCYSTIC BREAST DISEASE  . OSTEOMYELITIS  . CARCINOMA, UTERUS, HX OF  . RADIATION THERAPY, HX OF  . Atrial fibrillation  . Tachycardia-bradycardia syndrome  . S/P repair of patent ductus arteriosus  . Osteoarthritis  . Aortic stenosis, moderate  . Family history of malignant neoplasm of gastrointestinal tract  . Benign neoplasm of stomach  . Diarrhea  . S/P right TKA  . Acute respiratory failure with hypoxia  . S/P total knee arthroplasty  . CHF, acute on chronic   History  Substance Use Topics  . Smoking status: Never Smoker   . Smokeless tobacco: Never Used  . Alcohol Use: No       Objective:   Physical Exam well-developed elderly white female in no acute distress, pleasant company by her daughter blood pressure 142/80 pulse 88 height 5 foot 1 weight 145. HEENT; nontraumatic normocephalic EOMI PERRLA sclera anicteric, Neck; supple no JVD, Cardiovascular; regular rate and rhythm with S1-S2 no murmur or gallop, Pulmonary; clear bilaterally, Abdomen; soft, bowel sounds are active, she is tender in the right mid quadrant and right lower clot or and there is no palpable mass or hepatosplenomegaly him a she does have multiple incisional scars, she is nondistended. Rectal;; exam not done, Extremities; no clubbing cyanosis or edema skin warm and dry, Psych; mood and affect normal and appropriate        Assessment & Plan:  #63  78 year old female with a five-day  history of obstipation, midabdominal pain, distention and nausea and vomiting all of which are improving over the past 24 hours. This is in a patient who is status post multiple abdominal surgeries and also abdominal radiation. I suspect she has had a partial small bowel obstruction which is resolving. 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  Plan; Will followup on today's labs Schedule for CT scan of the abdomen and pelvis Full liquid diet over the next 24-48 hours and patient  asked to push fluids She has anti-emetics at home and will use this as needed I offered an analgesic but she does not feel that necessary at this time If her symptoms recur and/or worsen she was advised to go to the emergency room as  she may require admission.

## 2012-07-14 NOTE — Patient Instructions (Addendum)
We have given you a full liquid diet.  Try to stay on it  for 24-48 hours.   You have been scheduled for a CT scan of the abdomen and pelvis at  CT ([address]---this is in the same building as Architectural technologist).   You are scheduled on 07-17-2012 at 9:00 AM . You should arrive at 8:45 Am  to your appointment time for registration. Please follow the written instructions below on the day of your exam:  WARNING: IF YOU ARE ALLERGIC TO IODINE/X-RAY DYE, PLEASE NOTIFY RADIOLOGY IMMEDIATELY AT [phone]! YOU WILL BE GIVEN A 13 HOUR PREMEDICATION PREP.  1) Do not eat or drink anything after 5:00 AM  (4 hours prior to your test) 2) You have been given 2 bottles of oral contrast to drink. The solution may taste better if refrigerated, but do NOT add ice or any other liquid to this solution. Shake well before drinking.    Drink 1 bottle of contrast @ 7:00 am  (2 hours prior to your exam)  Drink 1 bottle of contrast @ 8:00 am  (1 hour prior to your exam)  You may take any medications as prescribed with a small amount of water except for the following: Metformin, Glucophage, Glucovance, Avandamet, Riomet, Fortamet, Actoplus Met, Janumet, Glumetza or Metaglip. The above medications must be held the day of the exam AND 48 hours after the exam.  The purpose of you drinking the oral contrast is to aid in the visualization of your intestinal tract. The contrast solution may cause some diarrhea. Before your exam is started, you will be given a small amount of fluid to drink. Depending on your individual set of symptoms, you may also receive an intravenous injection of x-ray contrast/dye. Plan on being at [facility] for 30 minutes or long, depending on the type of exam you are having performed.  This test typically takes 30-45 minutes to complete.  If you have any questions regarding your exam or if you need to reschedule, you may call the CT department at [phone]  between the hours of 8:00 am and 5:00 pm, Monday-Friday.  ________________________________________________________________________

## 2012-07-16 NOTE — Progress Notes (Signed)
Reviewed and agree with management. Robert D. Kaplan, M.D., FACG  

## 2012-07-17 ENCOUNTER — Ambulatory Visit (INDEPENDENT_AMBULATORY_CARE_PROVIDER_SITE_OTHER)
Admission: RE | Admit: 2012-07-17 | Discharge: 2012-07-17 | Disposition: A | Discharge status: Home / Self Care | Payer: 59 | Source: Ambulatory Visit | Attending: Physician Assistant | Admitting: Physician Assistant

## 2012-07-17 ENCOUNTER — Telehealth: Payer: Self-pay | Admitting: Internal Medicine

## 2012-07-17 DIAGNOSIS — K56609 Unspecified intestinal obstruction, unspecified as to partial versus complete obstruction: Secondary | ICD-10-CM

## 2012-07-17 DIAGNOSIS — C649 Malignant neoplasm of unspecified kidney, except renal pelvis: Secondary | ICD-10-CM

## 2012-07-17 DIAGNOSIS — Z8542 Personal history of malignant neoplasm of other parts of uterus: Secondary | ICD-10-CM

## 2012-07-17 DIAGNOSIS — K56600 Partial intestinal obstruction, unspecified as to cause: Secondary | ICD-10-CM

## 2012-07-17 MED ORDER — IOHEXOL 300 MG/ML  SOLN
100.0000 mL | Freq: Once | INTRAMUSCULAR | Status: AC | PRN
  Administered 2012-07-17: 80 mL via INTRAVENOUS

## 2012-07-17 NOTE — Telephone Encounter (Signed)
Transmission not received thru [facility]. Pt was attempted several times and unavailable. Line was busy. Will try later/kwm

## 2012-07-17 NOTE — Telephone Encounter (Signed)
Pt got letter that we did not get her transmission she will re transmit now and if we don't call her today at home number please call work number

## 2012-07-18 ENCOUNTER — Telehealth: Payer: Self-pay | Admitting: Internal Medicine

## 2012-07-18 NOTE — Telephone Encounter (Signed)
Pt wants to wait til Friday around noon to send transmission because that is when her daughter will be there to make sure it is being done right. If that is a problem just call and let her know.

## 2012-07-18 NOTE — Telephone Encounter (Signed)
Spoke w/pt at [phone] in regards to transmission. Pt aware that transmission not received. Pt will resend this evening and will call office tomorrow to make sure it was received.

## 2012-07-18 NOTE — Telephone Encounter (Signed)
Spoke w/pt to let know would be fine to wait until Friday to send transmission. Pt to still call and make sure transmission was received.

## 2012-07-21 ENCOUNTER — Ambulatory Visit (INDEPENDENT_AMBULATORY_CARE_PROVIDER_SITE_OTHER): Payer: 59 | Admitting: *Deleted

## 2012-07-21 ENCOUNTER — Encounter: Payer: Self-pay | Admitting: Internal Medicine

## 2012-07-21 DIAGNOSIS — I495 Sick sinus syndrome: Secondary | ICD-10-CM

## 2012-07-21 DIAGNOSIS — Z950 Presence of cardiac pacemaker: Secondary | ICD-10-CM

## 2012-07-26 LAB — REMOTE PACEMAKER DEVICE
BAMS-0001: 175 {beats}/min
RV LEAD AMPLITUDE: 16 mv
RV LEAD IMPEDENCE PM: 410 Ohm
RV LEAD THRESHOLD: 0.875 V

## 2012-08-01 ENCOUNTER — Encounter: Payer: Self-pay | Admitting: *Deleted

## 2012-08-07 ENCOUNTER — Ambulatory Visit (INDEPENDENT_AMBULATORY_CARE_PROVIDER_SITE_OTHER): Payer: 59 | Admitting: Cardiology

## 2012-08-07 ENCOUNTER — Encounter: Payer: Self-pay | Admitting: Cardiology

## 2012-08-07 ENCOUNTER — Other Ambulatory Visit (INDEPENDENT_AMBULATORY_CARE_PROVIDER_SITE_OTHER): Payer: 59

## 2012-08-07 ENCOUNTER — Ambulatory Visit: Payer: 59 | Admitting: *Deleted

## 2012-08-07 VITALS — BP 115/71 | HR 83 | Resp 18 | Ht 61.0 in | Wt 138.0 lb

## 2012-08-07 DIAGNOSIS — I509 Heart failure, unspecified: Secondary | ICD-10-CM

## 2012-08-07 DIAGNOSIS — I359 Nonrheumatic aortic valve disorder, unspecified: Secondary | ICD-10-CM

## 2012-08-07 DIAGNOSIS — E039 Hypothyroidism, unspecified: Secondary | ICD-10-CM

## 2012-08-07 DIAGNOSIS — I350 Nonrheumatic aortic (valve) stenosis: Secondary | ICD-10-CM

## 2012-08-07 DIAGNOSIS — E7800 Pure hypercholesterolemia, unspecified: Secondary | ICD-10-CM

## 2012-08-07 DIAGNOSIS — I4891 Unspecified atrial fibrillation: Secondary | ICD-10-CM

## 2012-08-07 DIAGNOSIS — I495 Sick sinus syndrome: Secondary | ICD-10-CM

## 2012-08-07 DIAGNOSIS — Z79899 Other long term (current) drug therapy: Secondary | ICD-10-CM

## 2012-08-07 DIAGNOSIS — I480 Paroxysmal atrial fibrillation: Secondary | ICD-10-CM

## 2012-08-07 DIAGNOSIS — I119 Hypertensive heart disease without heart failure: Secondary | ICD-10-CM

## 2012-08-07 LAB — HEPATIC FUNCTION PANEL
Alkaline Phosphatase: 101 U/L (ref 39–117)
Bilirubin, Direct: 0.1 mg/dL (ref 0.0–0.3)

## 2012-08-07 LAB — CBC WITH DIFFERENTIAL/PLATELET
Basophils Relative: 0.6 % (ref 0.0–3.0)
Eosinophils Relative: 2.2 % (ref 0.0–5.0)
Lymphocytes Relative: 21.8 % (ref 12.0–46.0)
MCV: 91 fl (ref 78.0–100.0)
Monocytes Absolute: 0.6 10*3/uL (ref 0.1–1.0)
Neutrophils Relative %: 67.3 % (ref 43.0–77.0)
Platelets: 191 10*3/uL (ref 150.0–400.0)
RBC: 4.57 Mil/uL (ref 3.87–5.11)
WBC: 7.5 10*3/uL (ref 4.5–10.5)

## 2012-08-07 LAB — LIPID PANEL
LDL Cholesterol: 89 mg/dL (ref 0–99)
Total CHOL/HDL Ratio: 4

## 2012-08-07 LAB — TSH: TSH: 1.09 u[IU]/mL (ref 0.35–5.50)

## 2012-08-07 LAB — BASIC METABOLIC PANEL
BUN: 35 mg/dL — ABNORMAL HIGH (ref 6–23)
Calcium: 9.1 mg/dL (ref 8.4–10.5)
Creatinine, Ser: 1.8 mg/dL — ABNORMAL HIGH (ref 0.4–1.2)
GFR: 29.61 mL/min — ABNORMAL LOW (ref >60.00)

## 2012-08-07 NOTE — Assessment & Plan Note (Signed)
The patient denies any symptoms of recurrent CHF.  She has not been experiencing any orthopnea or paroxysmal nocturnal dyspnea

## 2012-08-07 NOTE — Assessment & Plan Note (Addendum)
The patient has a history of mild aortic stenosis and moderate aortic insufficiency.  She has mild pulmonary hypertension and she has mild mitral regurgitation.  She has normal LV systolic function with grade 1 diastolic dysfunction.  She is not having any symptoms of congestive heart failure angina factors or exertional dizziness or syncope.

## 2012-08-07 NOTE — Patient Instructions (Addendum)
Your physician recommends that you continue on your current medications as directed. Please refer to the Current Medication list given to you today.  Your physician wants you to follow-up in: 4 months with fasting labs (lp/bmet/hfp/cbc/tsh) You will receive a reminder letter in the mail two months in advance. If you don't receive a letter, please call our office to schedule the follow-up appointment.   Will obtain labs today and call you with the results (lp/bmet/hfp/cbc/tsh)

## 2012-08-07 NOTE — Progress Notes (Signed)
[name] Date of Birth:  1933-10-14 [facility] [address] [place], [place]  [address] [phone]         Fax   [phone]  History of Present Illness: This pleasant 78 year old woman is seen for a scheduled followup office visit.  She has a past history of tachybradycardia syndrome . She has a permanent pacemaker implanted 07/01/10 . Since we last saw her she has had right total knee replacement. Postoperatively after going home she developed increasing dyspnea and wheezing and had to be readmitted with exacerbation of acute on chronic systolic congestive heart failure. The exacerbation of congestive heart failure was felt to be secondary to not having taken enough Lasix postoperative.  Since last visit she has not been having any chest pain or increased dyspnea. She has not had to use any of her inhalers. She has not been aware of any racing or tachycardia of her heart doing well. She has not been experiencing any racing or tachycardia of her heart.  She continues to work full-time.    Current Outpatient Prescriptions  Medication Sig Dispense Refill  . albuterol (PROVENTIL HFA;VENTOLIN HFA) 108 (90 BASE) MCG/ACT inhaler Inhale 1 puff into the lungs every 6 (six) hours as needed for wheezing or shortness of breath.  1 Inhaler  3  . amiodarone (PACERONE) 200 MG tablet Take 200 mg by mouth every evening.       [name] atenolol (TENORMIN) 100 MG tablet Take 100 mg by mouth 2 (two) times daily.       [name] atorvastatin (LIPITOR) 40 MG tablet Take 20 mg by mouth See admin instructions. Takes 1 tablet Monday-Friday      . calcium-vitamin D (OSCAL WITH D) 500-200 MG-UNIT per tablet Take 1 tablet by mouth daily.      . furosemide (LASIX) 40 MG tablet Take 40 mg by mouth as directed. 1 tablet Mon, Wed, Fri, and Sat only.  None the other days      . Hyoscyamine Sulfate (HYOMAX-DT) 0.375 MG TBCR Take one tab twice a day as needed for abdominal pain  25 each  1  . levothyroxine  (SYNTHROID, LEVOTHROID) 100 MCG tablet Take 50 mcg by mouth See admin instructions. Take 1/2 tab daily except for saturdays and sundays      . losartan-hydrochlorothiazide (HYZAAR) 100-25 MG per tablet Take 1 tablet by mouth daily.       . nitroGLYCERIN (NITRODUR - DOSED IN MG/24 HR) 0.4 mg/hr Place 1 patch onto the skin as needed. CHEST PAINS      . nitroGLYCERIN (NITROSTAT) 0.4 MG SL tablet Place 0.4 mg under the tongue every 5 (five) minutes as needed. CHEST PAINS      . potassium chloride SA (K-DUR,KLOR-CON) 20 MEQ tablet Take 1 tablet (20 mEq total) by mouth 3 (three) times daily.  270 tablet  3  . pregabalin (LYRICA) 75 MG capsule Take 75 mg by mouth 2 (two) times daily.       . sertraline (ZOLOFT) 100 MG tablet Take 100 mg by mouth every evening.       . warfarin (COUMADIN) 2 MG tablet Take 1-3 mg by mouth See admin instructions. Take 2mg  every night except Thursday take 1mg         Allergies  Allergen Reactions  . Codeine Nausea And Vomiting  . Zinc     nausea    Patient Active Problem List  Diagnosis  . RENAL CELL CANCER  . BENIGN NEOPLASM OTH&UNSPEC SITE DIGESTIVE  SYSTEM  . HYPOTHYROIDISM  . HYPERLIPIDEMIA  . ANXIETY  . DEPRESSION  . RESTLESS LEG SYNDROME  . HYPERTENSION  . Gastroparesis  . RADIATION PROCTITIS  . FIBROCYSTIC BREAST DISEASE  . OSTEOMYELITIS  . CARCINOMA, UTERUS, HX OF  . RADIATION THERAPY, HX OF  . Atrial fibrillation  . Tachycardia-bradycardia syndrome  . S/P repair of patent ductus arteriosus  . Osteoarthritis  . Aortic stenosis, moderate  . Family history of malignant neoplasm of gastrointestinal tract  . Benign neoplasm of stomach  . Diarrhea  . S/P right TKA  . Acute respiratory failure with hypoxia  . S/P total knee arthroplasty  . CHF, acute on chronic    History  Smoking status  . Never Smoker   Smokeless tobacco  . Never Used    History  Alcohol Use No    Family History  Problem Relation Age of Onset  . Colon cancer Mother  82  . Heart disease Mother   . Heart disease Father   . Heart attack Father   . Heart disease Maternal Grandmother     Review of Systems: Constitutional: no fever chills diaphoresis or fatigue or change in weight.  Head and neck: no hearing loss, no epistaxis, no photophobia or visual disturbance. Respiratory: No cough, shortness of breath or wheezing. Cardiovascular: No chest pain peripheral edema, palpitations. Gastrointestinal: No abdominal distention, no abdominal pain, no change in bowel habits hematochezia or melena. Genitourinary: No dysuria, no frequency, no urgency, no nocturia. Musculoskeletal:No arthralgias, no back pain, no gait disturbance or myalgias. Neurological: No dizziness, no headaches, no numbness, no seizures, no syncope, no weakness, no tremors. Hematologic: No lymphadenopathy, no easy bruising. Psychiatric: No confusion, no hallucinations, no sleep disturbance.    Physical Exam: Filed Vitals:   08/07/12 0853  BP: 115/71  Pulse: 83  Resp: 18   the general appearance reveals a well-developed alert woman in no distress.The head and neck exam reveals pupils equal and reactive.  Extraocular movements are full.  There is no scleral icterus.  The mouth and pharynx are normal.  The neck is supple.  The carotids reveal no bruits.  The jugular venous pressure is normal.  The  thyroid is not enlarged.  There is no lymphadenopathy.  The chest is clear to percussion and auscultation.  There are no rales or rhonchi.  Expansion of the chest is symmetrical.  The precordium is quiet.  The first heart sound is normal.  The second heart sound is physiologically split.  There is a grade 2/6 systolic ejection murmur at the base  There is no abnormal lift or heave.  The abdomen is soft and nontender.  The bowel sounds are normal.  The liver and spleen are not enlarged.  There are no abdominal masses.  There are no abdominal bruits.  Extremities reveal good pedal pulses.  There is no  phlebitis and there is trace ankle edema. There is no cyanosis or clubbing.  Strength is normal and symmetrical in all extremities.  There is no lateralizing weakness.  There are no sensory deficits.  The skin is warm and dry.  There is no rash.     Assessment / Plan: Continue same medication.  We're checking lab work today.  Recheck in 4 months for followup office visit EKG lipid panel hepatic function panel basal metabolic panel CBC and TSH.  She remains on low-dose amiodarone.

## 2012-08-07 NOTE — Progress Notes (Signed)
Quick Note:  Please report to patient. The recent labs are stable. Continue same medication and careful diet. Thyroid and CBC okay. Two of LFTs are slightly high. The kidneys are drier and the potassium is very low from the Lasix. Decrease the lasix to just half tablet 3 days a week. Continue KDur 20 meq TID ______

## 2012-08-07 NOTE — Assessment & Plan Note (Signed)
The patient has not been experiencing any awareness of tachycardia.  She is on long-term Coumadin.  Her INR today is subtherapeutic which she attributes to eating more greens over Thanksgiving

## 2012-08-09 ENCOUNTER — Telehealth: Payer: Self-pay | Admitting: *Deleted

## 2012-08-09 NOTE — Telephone Encounter (Signed)
Advised patient of labs and medication changes 

## 2012-08-09 NOTE — Telephone Encounter (Signed)
Message copied by Burnell Blanks on Wed Aug 09, 2012  3:59 PM ------      Message from: Cassell Clement      Created: Mon Aug 07, 2012  9:53 PM       Please report to patient.  The recent labs are stable. Continue same medication and careful diet. Thyroid and CBC okay.  Two of LFTs are slightly high. The kidneys are drier and the potassium is very low from the Lasix. Decrease the lasix to just half tablet 3 days a week.  Continue KDur 20 meq TID

## 2012-08-21 ENCOUNTER — Ambulatory Visit (INDEPENDENT_AMBULATORY_CARE_PROVIDER_SITE_OTHER): Payer: 59 | Admitting: *Deleted

## 2012-08-21 DIAGNOSIS — I4891 Unspecified atrial fibrillation: Secondary | ICD-10-CM

## 2012-08-21 LAB — POCT INR: INR: 2.4

## 2012-08-25 ENCOUNTER — Encounter (HOSPITAL_COMMUNITY): Payer: Self-pay | Admitting: Vascular Surgery

## 2012-08-25 ENCOUNTER — Inpatient Hospital Stay (HOSPITAL_COMMUNITY)
Admission: EM | Admit: 2012-08-25 | Discharge: 2012-08-29 | DRG: 293 | Disposition: A | Discharge status: Home / Self Care | Payer: 59 | Attending: Internal Medicine | Admitting: Internal Medicine

## 2012-08-25 ENCOUNTER — Emergency Department (HOSPITAL_COMMUNITY): Payer: 59

## 2012-08-25 DIAGNOSIS — N6019 Diffuse cystic mastopathy of unspecified breast: Secondary | ICD-10-CM

## 2012-08-25 DIAGNOSIS — Z905 Acquired absence of kidney: Secondary | ICD-10-CM

## 2012-08-25 DIAGNOSIS — D139 Benign neoplasm of ill-defined sites within the digestive system: Secondary | ICD-10-CM

## 2012-08-25 DIAGNOSIS — Z8542 Personal history of malignant neoplasm of other parts of uterus: Secondary | ICD-10-CM

## 2012-08-25 DIAGNOSIS — C649 Malignant neoplasm of unspecified kidney, except renal pelvis: Secondary | ICD-10-CM

## 2012-08-25 DIAGNOSIS — I495 Sick sinus syndrome: Secondary | ICD-10-CM

## 2012-08-25 DIAGNOSIS — R197 Diarrhea, unspecified: Secondary | ICD-10-CM

## 2012-08-25 DIAGNOSIS — E039 Hypothyroidism, unspecified: Secondary | ICD-10-CM | POA: Diagnosis present

## 2012-08-25 DIAGNOSIS — I5030 Unspecified diastolic (congestive) heart failure: Principal | ICD-10-CM | POA: Diagnosis present

## 2012-08-25 DIAGNOSIS — K3184 Gastroparesis: Secondary | ICD-10-CM

## 2012-08-25 DIAGNOSIS — K520 Gastroenteritis and colitis due to radiation: Secondary | ICD-10-CM

## 2012-08-25 DIAGNOSIS — Z79899 Other long term (current) drug therapy: Secondary | ICD-10-CM

## 2012-08-25 DIAGNOSIS — M129 Arthropathy, unspecified: Secondary | ICD-10-CM | POA: Diagnosis present

## 2012-08-25 DIAGNOSIS — E876 Hypokalemia: Secondary | ICD-10-CM | POA: Diagnosis not present

## 2012-08-25 DIAGNOSIS — D131 Benign neoplasm of stomach: Secondary | ICD-10-CM

## 2012-08-25 DIAGNOSIS — R791 Abnormal coagulation profile: Secondary | ICD-10-CM

## 2012-08-25 DIAGNOSIS — I4891 Unspecified atrial fibrillation: Secondary | ICD-10-CM | POA: Diagnosis present

## 2012-08-25 DIAGNOSIS — Z7901 Long term (current) use of anticoagulants: Secondary | ICD-10-CM

## 2012-08-25 DIAGNOSIS — F411 Generalized anxiety disorder: Secondary | ICD-10-CM | POA: Diagnosis present

## 2012-08-25 DIAGNOSIS — Z950 Presence of cardiac pacemaker: Secondary | ICD-10-CM | POA: Diagnosis present

## 2012-08-25 DIAGNOSIS — G5790 Unspecified mononeuropathy of unspecified lower limb: Secondary | ICD-10-CM | POA: Diagnosis present

## 2012-08-25 DIAGNOSIS — G2581 Restless legs syndrome: Secondary | ICD-10-CM | POA: Diagnosis present

## 2012-08-25 DIAGNOSIS — Z8774 Personal history of (corrected) congenital malformations of heart and circulatory system: Secondary | ICD-10-CM

## 2012-08-25 DIAGNOSIS — I2510 Atherosclerotic heart disease of native coronary artery without angina pectoris: Secondary | ICD-10-CM | POA: Diagnosis present

## 2012-08-25 DIAGNOSIS — I359 Nonrheumatic aortic valve disorder, unspecified: Secondary | ICD-10-CM | POA: Diagnosis present

## 2012-08-25 DIAGNOSIS — J9601 Acute respiratory failure with hypoxia: Secondary | ICD-10-CM

## 2012-08-25 DIAGNOSIS — R079 Chest pain, unspecified: Secondary | ICD-10-CM

## 2012-08-25 DIAGNOSIS — I350 Nonrheumatic aortic (valve) stenosis: Secondary | ICD-10-CM | POA: Diagnosis present

## 2012-08-25 DIAGNOSIS — Z800 Family history of malignant neoplasm of digestive organs: Secondary | ICD-10-CM

## 2012-08-25 DIAGNOSIS — I509 Heart failure, unspecified: Secondary | ICD-10-CM

## 2012-08-25 DIAGNOSIS — I129 Hypertensive chronic kidney disease with stage 1 through stage 4 chronic kidney disease, or unspecified chronic kidney disease: Secondary | ICD-10-CM | POA: Diagnosis present

## 2012-08-25 DIAGNOSIS — I10 Essential (primary) hypertension: Secondary | ICD-10-CM

## 2012-08-25 DIAGNOSIS — E785 Hyperlipidemia, unspecified: Secondary | ICD-10-CM | POA: Diagnosis present

## 2012-08-25 DIAGNOSIS — K219 Gastro-esophageal reflux disease without esophagitis: Secondary | ICD-10-CM | POA: Diagnosis present

## 2012-08-25 DIAGNOSIS — Z9189 Other specified personal risk factors, not elsewhere classified: Secondary | ICD-10-CM

## 2012-08-25 DIAGNOSIS — M869 Osteomyelitis, unspecified: Secondary | ICD-10-CM

## 2012-08-25 DIAGNOSIS — Z85528 Personal history of other malignant neoplasm of kidney: Secondary | ICD-10-CM

## 2012-08-25 DIAGNOSIS — R0602 Shortness of breath: Secondary | ICD-10-CM

## 2012-08-25 DIAGNOSIS — Z96659 Presence of unspecified artificial knee joint: Secondary | ICD-10-CM

## 2012-08-25 DIAGNOSIS — N189 Chronic kidney disease, unspecified: Secondary | ICD-10-CM | POA: Diagnosis present

## 2012-08-25 DIAGNOSIS — M1990 Unspecified osteoarthritis, unspecified site: Secondary | ICD-10-CM

## 2012-08-25 DIAGNOSIS — F329 Major depressive disorder, single episode, unspecified: Secondary | ICD-10-CM

## 2012-08-25 HISTORY — DX: Cardiac murmur, unspecified: R01.1

## 2012-08-25 HISTORY — DX: Anxiety disorder, unspecified: F41.9

## 2012-08-25 LAB — COMPREHENSIVE METABOLIC PANEL
ALT: 21 U/L (ref 0–35)
Alkaline Phosphatase: 107 U/L (ref 39–117)
BUN: 22 mg/dL (ref 6–23)
CO2: 24 mEq/L (ref 19–32)
Chloride: 105 mEq/L (ref 96–112)
GFR calc Af Amer: 43 mL/min — ABNORMAL LOW (ref >90)
Glucose, Bld: 120 mg/dL — ABNORMAL HIGH (ref 70–99)
Potassium: 3.4 mEq/L — ABNORMAL LOW (ref 3.5–5.1)
Sodium: 141 mEq/L (ref 135–145)
Total Bilirubin: 0.4 mg/dL (ref 0.3–1.2)
Total Protein: 6.4 g/dL (ref 6.0–8.3)

## 2012-08-25 LAB — CBC
HCT: 38 % (ref 36.0–46.0)
Hemoglobin: 12.8 g/dL (ref 12.0–15.0)
MCV: 88.6 fL (ref 78.0–100.0)
RBC: 4.29 MIL/uL (ref 3.87–5.11)
WBC: 6.2 10*3/uL (ref 4.0–10.5)

## 2012-08-25 LAB — PROTIME-INR: Prothrombin Time: 19.6 seconds — ABNORMAL HIGH (ref 11.6–15.2)

## 2012-08-25 LAB — POCT I-STAT TROPONIN I: Troponin i, poc: 0 ng/mL (ref 0.00–0.08)

## 2012-08-25 MED ORDER — ASPIRIN 81 MG PO CHEW
324.0000 mg | CHEWABLE_TABLET | Freq: Once | ORAL | Status: AC
  Administered 2012-08-25: 324 mg via ORAL

## 2012-08-25 MED ORDER — ONDANSETRON HCL 4 MG/2ML IJ SOLN: 4.0000 mg | Freq: Once | INTRAMUSCULAR | Status: DC

## 2012-08-25 MED ORDER — MORPHINE SULFATE 4 MG/ML IJ SOLN: 4.0000 mg | Freq: Once | INTRAMUSCULAR | Status: DC

## 2012-08-25 MED ORDER — ACETAMINOPHEN 500 MG PO TABS
1000.0000 mg | ORAL_TABLET | Freq: Once | ORAL | Status: AC
  Administered 2012-08-25: 1000 mg via ORAL
  Filled 2012-08-25: qty 2

## 2012-08-25 MED ORDER — ONDANSETRON HCL 4 MG/2ML IJ SOLN: 4.0000 mg | Freq: Three times a day (TID) | INTRAMUSCULAR | Status: DC | PRN

## 2012-08-25 NOTE — ED Provider Notes (Signed)
History     CSN: [id number]  Arrival date & time 08/25/12  2026   First MD Initiated Contact with Patient 08/25/12 2044      Chief Complaint  Patient presents with  . Chest Pain    (Consider location/radiation/quality/duration/timing/severity/associated sxs/prior treatment) HPI Comments: Patient states she's had intermittent headaches and high blood pressure for the past 3 days. The headaches are gradual in onset and progressively worsen. Today she is having chest pain it started around 12:00 and has been intermittent. She had an episode of sharp chest pain around 7 PM that lasted for about 30 minutes and resolved with nitroglycerin. She's a history of atrial fibrillation on Coumadin. He has a pacemaker for tachybradycardia syndrome. She denies having a heart attack. She denies any increased pain or swelling in her legs.  The history is provided by the patient and the EMS personnel.    Past Medical History  Diagnosis Date  . PAF (paroxysmal atrial fibrillation)     controlled with amiodarone, on coumadin  . CAD (coronary artery disease)   . Aortic root dilatation   . HTN (hypertension)   . Hyperlipemia   . Hypothyroidism   . Moderate aortic stenosis   . Chronic anticoagulation   . Tachycardia-bradycardia syndrome     s/p PPM by Dr Deborah Chalk (MDT) 07/03/10  . GERD (gastroesophageal reflux disease)   . Chronic kidney disease   . Pacemaker     2012  . Arthritis   . History of nephrectomy     left - due to kidney cancer  . Neuropathy     feet/legs  . Restless leg syndrome   . Headache     hx migraines - takes Zoloft for migraines  . History of uterine cancer   . Cancer     hx Kidney cancer / hx of endometrial cancer  . PONV (postoperative nausea and vomiting)   . Anxiety   . Shortness of breath   . Heart murmur     Past Surgical History  Procedure Date  . Cardiac catheterization 2008  . Nephrectomy   . Total abdominal hysterectomy   . Patent ductus arterious  repair age 44  . Ptvp   . Pacemaker insertion 07/04/11    by Dr Deborah Chalk  . Cholecystectomy   . Upper gastrointestinal endoscopy   . Joint replacement     rt total hip 2008  . Total knee arthroplasty 02/07/2012    Procedure: TOTAL KNEE ARTHROPLASTY;  Surgeon: Shelda Pal, MD;  Location: [facility];  Service: Orthopedics;  Laterality: Right;  . Insert / replace / remove pacemaker     Family History  Problem Relation Age of Onset  . Colon cancer Mother 15  . Heart disease Mother   . Heart disease Father   . Heart attack Father   . Heart disease Maternal Grandmother     History  Substance Use Topics  . Smoking status: Never Smoker   . Smokeless tobacco: Never Used  . Alcohol Use: No    OB History    Grav Para Term Preterm Abortions TAB SAB Ect Mult Living                  Review of Systems  Constitutional: Positive for activity change and appetite change. Negative for fever.  HENT: Negative for congestion and rhinorrhea.   Respiratory: Positive for chest tightness and shortness of breath. Negative for cough.   Cardiovascular: Positive for chest pain.  Gastrointestinal: Negative for nausea, vomiting  and abdominal pain.  Genitourinary: Negative for dysuria, hematuria, vaginal bleeding and vaginal discharge.  Musculoskeletal: Negative for back pain.  Skin: Negative for rash.  Neurological: Negative for dizziness, weakness and headaches.  A complete 10 system review of systems was obtained and all systems are negative except as noted in the HPI and PMH.    Allergies  Codeine and Zinc  Home Medications   No current outpatient prescriptions on file.  BP 180/73  Pulse 73  Temp 97.3 F (36.3 C) (Oral)  Resp 18  Ht 5\' 1"  (1.549 m)  Wt 145 lb 1 oz (65.8 kg)  BMI 27.41 kg/m2  SpO2 95%  Physical Exam  Constitutional: She is oriented to person, place, and time. She appears well-developed and well-nourished. No distress.  HENT:  Head: Normocephalic and atraumatic.   Mouth/Throat: Oropharynx is clear and moist. No oropharyngeal exudate.  Eyes: Conjunctivae normal and EOM are normal. Pupils are equal, round, and reactive to light.  Neck: Normal range of motion. Neck supple.  Cardiovascular: Normal rate, regular rhythm and normal heart sounds.   No murmur heard. Pulmonary/Chest: Effort normal and breath sounds normal. No respiratory distress.  Abdominal: Soft. There is no tenderness. There is no rebound and no guarding.  Musculoskeletal: Normal range of motion. She exhibits no edema and no tenderness.  Neurological: She is alert and oriented to person, place, and time. No cranial nerve deficit. She exhibits normal muscle tone. Coordination normal.  Skin: Skin is warm. No rash noted.    ED Course  Procedures (including critical care time)  Labs Reviewed  PRO B NATRIURETIC PEPTIDE - Abnormal; Notable for the following:    Pro B Natriuretic peptide (BNP) 1480.0 (*)     All other components within normal limits  COMPREHENSIVE METABOLIC PANEL - Abnormal; Notable for the following:    Potassium 3.4 (*)     Glucose, Bld 120 (*)     Creatinine, Ser 1.36 (*)     Albumin 3.2 (*)     GFR calc non Af Amer 37 (*)     GFR calc Af Amer 43 (*)     All other components within normal limits  PROTIME-INR - Abnormal; Notable for the following:    Prothrombin Time 19.6 (*)     INR 1.72 (*)     All other components within normal limits  CBC  D-DIMER, QUANTITATIVE  POCT I-STAT TROPONIN I  TROPONIN I  CK TOTAL AND CKMB  CBC  CREATININE, SERUM  BASIC METABOLIC PANEL  CBC  TROPONIN I  TROPONIN I  CK TOTAL AND CKMB  CK TOTAL AND CKMB  PROTIME-INR   Dg Chest 2 View  08/25/2012  *RADIOLOGY REPORT*  Clinical Data: Chest pain  CHEST - 2 VIEW  Comparison: 02/10/2012 CT  Findings: Right chest wall battery pack the tips project over the right atrium and right ventricle.  Heart size upper normal.  Aortic arch atherosclerosis.  Central vascular congestion. Mild  interstitial prominence appears chronic.  Trace effusions not excluded.  No pneumothorax.  Surgical clips right upper quadrant.  IMPRESSION: Heart size upper normal.  Central vascular congestion.  No overt edema.   Original Report Authenticated By: Jearld Lesch, M.D.      1. Chest pain   2. SOB (shortness of breath)   3. Atrial fibrillation   4. Aortic stenosis, moderate   5. Subtherapeutic international normalized ratio (INR)   6. Unspecified essential hypertension   7. Unspecified hypothyroidism   8. Other and  unspecified hyperlipidemia   9. Pacemaker       MDM  Intermittent chest pain, headache and SOB for the past 3 days.  Worsened around 1900.  Some improvement with nitro. Headache gradual in onset.  Denies history of MI.  Cath 2008.  EKG unchanged, troponin negative. INR subtherapeutic but D-dimer negative. DOubt PE.  Admission for MI rule out given risk factors.      Date: 08/25/2012  Rate: 66  Rhythm: normal sinus rhythm  QRS Axis: normal  Intervals: normal  ST/T Wave abnormalities: normal  Conduction Disutrbances:none  Narrative Interpretation:   Old EKG Reviewed: unchanged      Glynn Octave, MD 08/26/12 [phone]

## 2012-08-25 NOTE — ED Notes (Signed)
Pt reports to the ED for eval of CP that began today around 12 pm and came back around 1900 this pm. States that when she has the CP she also has SOB and feels like she is hypertensive and flushed. States that she has been feeling like her blood pressure has been high on and off since last Tuesday. She had 3 nitros and 324 of ASA en route. EKG was unremarkable per EMS. Pt does have an atrial pacemaker. She has a hx of A. Fib. The nitro took her pain from a 7 to a 5.

## 2012-08-26 ENCOUNTER — Encounter (HOSPITAL_COMMUNITY): Payer: Self-pay | Admitting: *Deleted

## 2012-08-26 DIAGNOSIS — Z950 Presence of cardiac pacemaker: Secondary | ICD-10-CM | POA: Diagnosis present

## 2012-08-26 DIAGNOSIS — I359 Nonrheumatic aortic valve disorder, unspecified: Secondary | ICD-10-CM

## 2012-08-26 LAB — TROPONIN I
Troponin I: <0.3 ng/mL (ref <0.30)
Troponin I: <0.3 ng/mL
Troponin I: <0.3 ng/mL

## 2012-08-26 LAB — CK TOTAL AND CKMB (NOT AT ARMC)
CK, MB: 2.8 ng/mL (ref 0.3–4.0)
CK, MB: 2.7 ng/mL (ref 0.3–4.0)
Relative Index: 1.1 (ref 0.0–2.5)
Relative Index: 1.1 (ref 0.0–2.5)
Total CK: 246 U/L — ABNORMAL HIGH (ref 7–177)
Total CK: 246 U/L — ABNORMAL HIGH (ref 7–177)

## 2012-08-26 LAB — PROTIME-INR
INR: 1.63 — ABNORMAL HIGH (ref 0.00–1.49)
Prothrombin Time: 18.8 seconds — ABNORMAL HIGH (ref 11.6–15.2)

## 2012-08-26 LAB — CBC
MCH: 29.8 pg (ref 26.0–34.0)
Platelets: 163 10*3/uL (ref 150–400)
RBC: 4.46 MIL/uL (ref 3.87–5.11)

## 2012-08-26 LAB — BASIC METABOLIC PANEL WITH GFR
BUN: 21 mg/dL (ref 6–23)
CO2: 25 meq/L (ref 19–32)
Calcium: 9.1 mg/dL (ref 8.4–10.5)
Chloride: 106 meq/L (ref 96–112)
Creatinine, Ser: 1.28 mg/dL — ABNORMAL HIGH (ref 0.50–1.10)
GFR calc Af Amer: 46 mL/min — ABNORMAL LOW
GFR calc non Af Amer: 40 mL/min — ABNORMAL LOW
Glucose, Bld: 91 mg/dL (ref 70–99)
Potassium: 3.5 meq/L (ref 3.5–5.1)
Sodium: 144 meq/L (ref 135–145)

## 2012-08-26 MED ORDER — ONDANSETRON HCL 4 MG PO TABS: 4.0000 mg | ORAL_TABLET | Freq: Four times a day (QID) | ORAL | Status: DC | PRN

## 2012-08-26 MED ORDER — ONDANSETRON HCL 4 MG/2ML IJ SOLN: 4.0000 mg | Freq: Four times a day (QID) | INTRAMUSCULAR | Status: DC | PRN

## 2012-08-26 MED ORDER — LOSARTAN POTASSIUM 50 MG PO TABS
100.0000 mg | ORAL_TABLET | Freq: Every day | ORAL | Status: DC
  Administered 2012-08-26 – 2012-08-29 (×4): 100 mg via ORAL
  Filled 2012-08-26 (×4): qty 2

## 2012-08-26 MED ORDER — PREGABALIN 25 MG PO CAPS
75.0000 mg | ORAL_CAPSULE | Freq: Two times a day (BID) | ORAL | Status: DC
  Administered 2012-08-26 – 2012-08-29 (×7): 75 mg via ORAL
  Filled 2012-08-26 (×7): qty 3

## 2012-08-26 MED ORDER — ACETAMINOPHEN 325 MG PO TABS
650.0000 mg | ORAL_TABLET | ORAL | Status: DC | PRN
  Administered 2012-08-26 (×2): 650 mg via ORAL

## 2012-08-26 MED ORDER — ZOLPIDEM TARTRATE 5 MG PO TABS: 5.0000 mg | ORAL_TABLET | Freq: Every evening | ORAL | Status: DC | PRN

## 2012-08-26 MED ORDER — SODIUM CHLORIDE 0.9 % IJ SOLN
3.0000 mL | Freq: Two times a day (BID) | INTRAMUSCULAR | Status: DC
  Administered 2012-08-26 – 2012-08-29 (×7): 3 mL via INTRAVENOUS

## 2012-08-26 MED ORDER — ALBUTEROL SULFATE HFA 108 (90 BASE) MCG/ACT IN AERS: 1.0000 | INHALATION_SPRAY | Freq: Four times a day (QID) | RESPIRATORY_TRACT | Status: DC | PRN

## 2012-08-26 MED ORDER — FAMOTIDINE 10 MG PO TABS: 10.0000 mg | ORAL_TABLET | Freq: Two times a day (BID) | ORAL | Status: DC | PRN

## 2012-08-26 MED ORDER — ASPIRIN EC 325 MG PO TBEC
325.0000 mg | DELAYED_RELEASE_TABLET | Freq: Every day | ORAL | Status: DC
  Administered 2012-08-26 – 2012-08-28 (×3): 325 mg via ORAL
  Filled 2012-08-26 (×3): qty 1

## 2012-08-26 MED ORDER — ALUM & MAG HYDROXIDE-SIMETH 200-200-20 MG/5ML PO SUSP: 30.0000 mL | Freq: Four times a day (QID) | ORAL | Status: DC | PRN

## 2012-08-26 MED ORDER — HYDROMORPHONE HCL PF 1 MG/ML IJ SOLN: 0.5000 mg | INTRAMUSCULAR | Status: DC | PRN

## 2012-08-26 MED ORDER — AMLODIPINE BESYLATE 10 MG PO TABS
10.0000 mg | ORAL_TABLET | Freq: Every day | ORAL | Status: DC
  Administered 2012-08-26: 10 mg via ORAL
  Filled 2012-08-26 (×2): qty 1

## 2012-08-26 MED ORDER — SODIUM CHLORIDE 0.9 % IJ SOLN: 3.0000 mL | INTRAMUSCULAR | Status: DC | PRN

## 2012-08-26 MED ORDER — LEVOTHYROXINE SODIUM 50 MCG PO TABS
50.0000 ug | ORAL_TABLET | ORAL | Status: DC
  Administered 2012-08-28 – 2012-08-29 (×2): 50 ug via ORAL
  Filled 2012-08-26 (×3): qty 1

## 2012-08-26 MED ORDER — SODIUM CHLORIDE 0.9 % IV SOLN: 250.0000 mL | INTRAVENOUS | Status: DC | PRN

## 2012-08-26 MED ORDER — WARFARIN SODIUM 2.5 MG PO TABS
2.5000 mg | ORAL_TABLET | Freq: Once | ORAL | Status: AC
  Administered 2012-08-26: 2.5 mg via ORAL
  Filled 2012-08-26: qty 1

## 2012-08-26 MED ORDER — MAGNESIUM OXIDE 400 (241.3 MG) MG PO TABS
400.0000 mg | ORAL_TABLET | ORAL | Status: DC
  Administered 2012-08-28: 400 mg via ORAL
  Filled 2012-08-26: qty 1

## 2012-08-26 MED ORDER — WARFARIN SODIUM 2.5 MG PO TABS
2.5000 mg | ORAL_TABLET | ORAL | Status: AC
  Administered 2012-08-26: 2.5 mg via ORAL
  Filled 2012-08-26: qty 1

## 2012-08-26 MED ORDER — FUROSEMIDE 10 MG/ML IJ SOLN
40.0000 mg | Freq: Two times a day (BID) | INTRAMUSCULAR | Status: DC
  Administered 2012-08-26 – 2012-08-27 (×3): 40 mg via INTRAVENOUS
  Filled 2012-08-26 (×3): qty 4

## 2012-08-26 MED ORDER — SERTRALINE HCL 50 MG PO TABS
50.0000 mg | ORAL_TABLET | Freq: Every evening | ORAL | Status: DC
  Administered 2012-08-26 – 2012-08-28 (×3): 50 mg via ORAL
  Filled 2012-08-26 (×4): qty 1

## 2012-08-26 MED ORDER — ENOXAPARIN SODIUM 40 MG/0.4ML ~~LOC~~ SOLN
40.0000 mg | SUBCUTANEOUS | Status: DC
  Administered 2012-08-26 – 2012-08-27 (×2): 40 mg via SUBCUTANEOUS
  Filled 2012-08-26 (×3): qty 0.4

## 2012-08-26 MED ORDER — SODIUM CHLORIDE 0.9 % IV SOLN: INTRAVENOUS | Status: DC

## 2012-08-26 MED ORDER — LOSARTAN POTASSIUM-HCTZ 100-25 MG PO TABS: 1.0000 | ORAL_TABLET | Freq: Every day | ORAL | Status: DC

## 2012-08-26 MED ORDER — POTASSIUM CHLORIDE CRYS ER 20 MEQ PO TBCR
20.0000 meq | EXTENDED_RELEASE_TABLET | Freq: Two times a day (BID) | ORAL | Status: DC
  Administered 2012-08-26 (×2): 20 meq via ORAL
  Filled 2012-08-26 (×4): qty 1

## 2012-08-26 MED ORDER — LEVOTHYROXINE SODIUM 50 MCG PO TABS: 50.0000 ug | ORAL_TABLET | ORAL | Status: DC

## 2012-08-26 MED ORDER — ATENOLOL 100 MG PO TABS
100.0000 mg | ORAL_TABLET | Freq: Two times a day (BID) | ORAL | Status: DC
  Administered 2012-08-26 – 2012-08-29 (×7): 100 mg via ORAL
  Filled 2012-08-26 (×8): qty 1

## 2012-08-26 MED ORDER — VITAMIN D3 25 MCG (1000 UNIT) PO TABS
5000.0000 [IU] | ORAL_TABLET | Freq: Every morning | ORAL | Status: DC
  Administered 2012-08-26 – 2012-08-29 (×4): 5000 [IU] via ORAL
  Filled 2012-08-26 (×4): qty 5

## 2012-08-26 MED ORDER — AMIODARONE HCL 200 MG PO TABS
200.0000 mg | ORAL_TABLET | Freq: Every evening | ORAL | Status: DC
  Administered 2012-08-26 – 2012-08-28 (×3): 200 mg via ORAL
  Filled 2012-08-26 (×4): qty 1

## 2012-08-26 MED ORDER — HYDROCHLOROTHIAZIDE 25 MG PO TABS
25.0000 mg | ORAL_TABLET | Freq: Every day | ORAL | Status: DC
  Administered 2012-08-26 – 2012-08-27 (×2): 25 mg via ORAL
  Filled 2012-08-26 (×3): qty 1

## 2012-08-26 MED ORDER — NITROGLYCERIN 0.4 MG SL SUBL: 0.4000 mg | SUBLINGUAL_TABLET | SUBLINGUAL | Status: DC | PRN

## 2012-08-26 MED ORDER — WARFARIN - PHARMACIST DOSING INPATIENT
Freq: Every day | Status: DC
  Administered 2012-08-27: 18:00:00

## 2012-08-26 MED ORDER — NITROGLYCERIN 2 % TD OINT
1.0000 [in_us] | TOPICAL_OINTMENT | Freq: Four times a day (QID) | TRANSDERMAL | Status: DC
  Administered 2012-08-26 – 2012-08-27 (×4): 1 [in_us] via TOPICAL
  Filled 2012-08-26: qty 30

## 2012-08-26 MED ORDER — ACETAMINOPHEN 325 MG PO TABS
650.0000 mg | ORAL_TABLET | Freq: Four times a day (QID) | ORAL | Status: DC | PRN
  Filled 2012-08-26 (×2): qty 2

## 2012-08-26 MED ORDER — OXYCODONE HCL 5 MG PO TABS: 5.0000 mg | ORAL_TABLET | ORAL | Status: DC | PRN

## 2012-08-26 MED ORDER — ACETAMINOPHEN 650 MG RE SUPP: 650.0000 mg | Freq: Four times a day (QID) | RECTAL | Status: DC | PRN

## 2012-08-26 MED ORDER — ATORVASTATIN CALCIUM 20 MG PO TABS
20.0000 mg | ORAL_TABLET | ORAL | Status: DC
  Administered 2012-08-28 – 2012-08-29 (×2): 20 mg via ORAL
  Filled 2012-08-26 (×2): qty 1

## 2012-08-26 MED ORDER — CALCIUM CARBONATE-VITAMIN D 500-200 MG-UNIT PO TABS
1.0000 | ORAL_TABLET | Freq: Every day | ORAL | Status: DC
  Administered 2012-08-26 – 2012-08-29 (×4): 1 via ORAL
  Filled 2012-08-26 (×4): qty 1

## 2012-08-26 NOTE — Progress Notes (Signed)
ANTICOAGULATION CONSULT NOTE - Follow Up Consult  Pharmacy Consult for Coumadin Indication: atrial fibrillation  Allergies  Allergen Reactions  . Codeine Nausea And Vomiting  . Zinc     nausea    Patient Measurements: Height: 5\' 1"  (154.9 cm) Weight: 145 lb 1 oz (65.8 kg) IBW/kg (Calculated) : 47.8  Heparin Dosing Weight:   Vital Signs: Temp: 97.8 F (36.6 C) (12/21 0441) Temp src: Oral (12/21 0441) BP: 168/72 mmHg (12/21 0441) Pulse Rate: 71  (12/21 0441)  Labs:  Basename 08/26/12 0834 08/26/12 0500 08/25/12 2353 08/25/12 2108  HGB -- 13.3 -- 12.8  HCT -- 39.6 -- 38.0  PLT -- 163 -- 163  APTT -- -- -- --  LABPROT -- 18.8* -- 19.6*  INR -- 1.63* -- 1.72*  HEPARINUNFRC -- -- -- --  CREATININE -- 1.28* -- 1.36*  CKTOTAL 266* -- 246* --  CKMB 2.8 -- 2.9 --  TROPONINI <0.30 -- <0.30 --    Estimated Creatinine Clearance: 32.5 ml/min (by C-G formula based on Cr of 1.28).  Assessment: 78yof continuing on chronic Coumadin for Afib. INR (1.63) is subtherapeutic and most likely does not reflect the 2.5mg  dose received early this AM. Per Coumadin clinic notes, patient has been fairly stable on PTA regimen (2mg  daily except 1mg  MWF) with INR of 2.4 on 12/16.  Patient is also receiving Lovenox 40mg  daily. - H/H and Plts wnl - No significant bleeding reported  Goal of Therapy:  INR 2-3   Plan:  1. Repeat Coumadin 2.5mg  po x 1 today 2. Follow-up AM INR 3. Follow-up need for anticoagulation bridge if INR continues to decrease  Cleon Dew [phone] 08/26/2012,1:02 PM

## 2012-08-26 NOTE — Progress Notes (Signed)
9:14 AM I agree with HPI/GPe and A/P per Dr. Lovell Sheehan  Patient admitted if tightness in the chest, noted to be somewhat short of breath as well. She states that her blood pressure somewhat elevated yesterday and that she does not take daily Lasix rather she takes every couple of days when she feels swollen. She denies any current chest pain and is laughing and smiling at bedside the family. She states that the onset has not been insidious this just happened all of a sudden yesterday. No blurred vision no double vision no other issues.  Telemetry shows atrial pacing      HEENT alert pleasant oriented no apparent CHEST clinically clear CARDIAC S1-S2 no murmur rub or ABDOMEN soft nontender NEURO grossly intact SKIN/MUSCULAR grossly intact  Chart review  Admission 6.6.13 c  Acute resp failure 2/2 fluid overload  Admit 02/07/12 for  R TKR  Admit 03/29/11 c dyspnea on exertion, thought to be CHF  permanent pacemaker implanted 07/01/10   Admit 4.11.09 c PAF  nted h/o Transitional cell ca, Uterine Ca-had hyserectomy.nephrectomy  H/o open heart surgery age 78 for PDA  Admit 10/10/07 R hip pain-R TH replacement  L heart cath 10.6.08  Admit 7.15.08 c Hypovolemic syncopy, prolonged QTC + bradycardia  S/p radical nephrectomy 10.22.03  Patient Active Problem List  Diagnosis  . RENAL CELL CANCER  . BENIGN NEOPLASM OTH&UNSPEC SITE DIGESTIVE SYSTEM  . HYPOTHYROIDISM  . HYPERLIPIDEMIA  . ANXIETY  . DEPRESSION  . RESTLESS LEG SYNDROME  . HYPERTENSION  . Gastroparesis  . RADIATION PROCTITIS  . FIBROCYSTIC BREAST DISEASE  . OSTEOMYELITIS  . CARCINOMA, UTERUS, HX OF  . RADIATION THERAPY, HX OF  . Atrial fibrillation  . Tachycardia-bradycardia syndrome  . S/P repair of patent ductus arteriosus  . Osteoarthritis  . Aortic stenosis, moderate  . Family history of malignant neoplasm of gastrointestinal tract  . Benign neoplasm of stomach  . Diarrhea  . S/P right TKA  . Acute  respiratory failure with hypoxia  . S/P total knee arthroplasty  . CHF, acute on chronic  . Chest pain  . SOB (shortness of breath)  . Subtherapeutic international normalized ratio (INR)  . Pacemaker   1. Chest pain-in the process of being ruled out. Likely related to #2. 2. Likely flash pulmonary edema-we will diuresis continue with IV Lasix 40 mg every 12 hourly. Echocardiogram has been ordered to update. We will follow 3. Atrial fibrillation status post pacemaker placement-continue amiodarone 200 every afternoon, amlodipine 10 g daily, atenolol 100 twice a day-continue Coumadin-INR is 1.63 today. 4. Hypertension-continue losartan 100 daily, HCTZ 25 daily 5. Depression continue Zoloft 50 every afternoon. 6. Amiodarone use-get basic and TSH and LFTs in a.m 7. Hypothyroidism-continue Synthroid 50 mcg daily.  Pleas Koch, MD [facility] [phone]

## 2012-08-26 NOTE — Progress Notes (Signed)
ANTICOAGULATION CONSULT NOTE - Initial Consult  Pharmacy Consult for Coumadin Indication: atrial fibrillation  Allergies  Allergen Reactions  . Codeine Nausea And Vomiting  . Zinc     nausea    Vital Signs: Temp: 98 F (36.7 C) (12/20 2030) Temp src: Oral (12/20 2030) BP: 129/45 mmHg (12/21 0000) Pulse Rate: 60  (12/21 0000)  Labs:  Basename 08/25/12 2108  HGB 12.8  HCT 38.0  PLT 163  APTT --  LABPROT 19.6*  INR 1.72*  HEPARINUNFRC --  CREATININE 1.36*  CKTOTAL --  CKMB --  TROPONINI --    Medical History: Past Medical History  Diagnosis Date  . PAF (paroxysmal atrial fibrillation)     controlled with amiodarone, on coumadin  . CAD (coronary artery disease)   . Aortic root dilatation   . HTN (hypertension)   . Hyperlipemia   . Hypothyroidism   . Moderate aortic stenosis   . Chronic anticoagulation   . Tachycardia-bradycardia syndrome     s/p PPM by Dr Deborah Chalk (MDT) 07/03/10  . GERD (gastroesophageal reflux disease)   . Chronic kidney disease   . PONV (postoperative nausea and vomiting)   . Pacemaker     2012  . Arthritis   . History of nephrectomy     left - due to kidney cancer  . Neuropathy     feet/legs  . Restless leg syndrome   . Headache     hx migraines - takes Zoloft for migraines  . History of uterine cancer   . Cancer     hx Kidney cancer / hx of endometrial cancer    Medications:  Prescriptions prior to admission  Medication Sig Dispense Refill  . albuterol (PROVENTIL HFA;VENTOLIN HFA) 108 (90 BASE) MCG/ACT inhaler Inhale 1 puff into the lungs every 6 (six) hours as needed for wheezing or shortness of breath.  1 Inhaler  3  . amiodarone (PACERONE) 200 MG tablet Take 200 mg by mouth every evening.       [name] amLODipine (NORVASC) 10 MG tablet Take 10 mg by mouth daily.      [name] atenolol (TENORMIN) 100 MG tablet Take 100 mg by mouth 2 (two) times daily.       [name] atorvastatin (LIPITOR) 40 MG tablet Take 20 mg by mouth See admin instructions.  Takes 1 tablet Monday-Friday      . calcium-vitamin D (OSCAL WITH D) 500-200 MG-UNIT per tablet Take 1 tablet by mouth daily.      . cholecalciferol (VITAMIN D) 1000 UNITS tablet Take 5,000 Units by mouth every morning.      . famotidine (PEPCID) 10 MG tablet Take 10 mg by mouth daily as needed. For acid reducer      . furosemide (LASIX) 40 MG tablet Take 40 mg by mouth 4 (four) times a week. Mon, wed, fri, sat      . levothyroxine (SYNTHROID, LEVOTHROID) 100 MCG tablet Take 50 mcg by mouth See admin instructions. Take 1/2 tab daily except for saturdays and sundays      . losartan-hydrochlorothiazide (HYZAAR) 100-25 MG per tablet Take 1 tablet by mouth daily.       . Magnesium 250 MG TABS Take 1 tablet by mouth every Monday, Wednesday, and Friday.      . nitroGLYCERIN (NITRODUR - DOSED IN MG/24 HR) 0.4 mg/hr Place 1 patch onto the skin as needed. CHEST PAINS      . nitroGLYCERIN (NITROSTAT) 0.4 MG SL tablet Place 0.4 mg under the tongue every  5 (five) minutes as needed. CHEST PAINS      . potassium chloride SA (K-DUR,KLOR-CON) 20 MEQ tablet Take 1 tablet (20 mEq total) by mouth 3 (three) times daily.  270 tablet  3  . pregabalin (LYRICA) 75 MG capsule Take 75 mg by mouth 2 (two) times daily.       . sertraline (ZOLOFT) 50 MG tablet Take 50 mg by mouth every evening.      . warfarin (COUMADIN) 2 MG tablet Take 1-2 mg by mouth daily. Take 2mg  every night except mon, wed, fri 1 mg       Scheduled:    . [COMPLETED] acetaminophen  1,000 mg Oral Once  . [COMPLETED] aspirin  324 mg Oral Once  . aspirin EC  325 mg Oral Daily  . enoxaparin (LOVENOX) injection  40 mg Subcutaneous Q24H  .  morphine injection  4 mg Intravenous Once  . nitroGLYCERIN  1 inch Topical Q6H  . ondansetron (ZOFRAN) IV  4 mg Intravenous Once  . warfarin  2.5 mg Oral NOW  . Warfarin - Pharmacist Dosing Inpatient   Does not apply q1800    Assessment: 78yo female c/o intermittent CP/SOB x4d, worsened today and unrelieved with  NTG x3, to continue Coumadin for Afib; admitted with subtherapeutic INR.  Goal of Therapy:  INR 2-3   Plan:  Will give boosted dose of Coumadin 2.5mg  po x1 now and monitor INR for dose adjustments.  Colleen Can PharmD BCPS 08/26/2012,12:43 AM

## 2012-08-26 NOTE — H&P (Addendum)
[facility] History and Physical  [name] ZOX:[id number] DOB: 03-19-1934 DOA: 08/25/2012  Referring physician: EDP PCP: Nadean Corwin, MD  Specialists: Patty Sermons (Cardiologist) Chief Complaint: Chest Tightness and SOB  HPI: [name] is a 78 y.o. female with multiple medical problems including Atrial fibrillation who presents with complaints of  Intermittent chest discomfort and SOB for the past 4 days.  She describes having chest tightness and a feeling as if an elephant was sitting on her chest and the discomfort has been associated with nausea, SOB and diaphoresis.  The pain worsened today and was an 9/10 and she took 3 SL NTG and had only mild improvement in the pain, so she came to the ED.  In the ED her initial workup has been negative.     Review of Systems: The patient denies anorexia, fever, weight loss, vision loss, decreased hearing, hoarseness, syncope, dyspnea on exertion, peripheral edema, balance deficits, hemoptysis, abdominal pain, melena, hematochezia, severe indigestion/heartburn, nausea, vomiting, diarrhea, constipation, hematuria, incontinence, dysuria, muscle weakness, suspicious skin lesions, transient blindness, difficulty walking, depression, unusual weight change, abnormal bleeding, enlarged lymph nodes, angioedema, and breast masses.    Past Medical History  Diagnosis Date  . PAF (paroxysmal atrial fibrillation)     controlled with amiodarone, on coumadin  . CAD (coronary artery disease)   . Aortic root dilatation   . HTN (hypertension)   . Hyperlipemia   . Hypothyroidism   . Moderate aortic stenosis   . Chronic anticoagulation   . Tachycardia-bradycardia syndrome     s/p PPM by Dr Deborah Chalk (MDT) 07/03/10  . GERD (gastroesophageal reflux disease)   . Chronic kidney disease   . PONV (postoperative nausea and vomiting)   . Pacemaker     2012  . Arthritis   . History of nephrectomy     left - due to kidney cancer  . Neuropathy      feet/legs  . Restless leg syndrome   . Headache     hx migraines - takes Zoloft for migraines  . History of uterine cancer   . Cancer     hx Kidney cancer / hx of endometrial cancer   Past Surgical History  Procedure Date  . Cardiac catheterization 2008  . Nephrectomy   . Total abdominal hysterectomy   . Patent ductus arterious repair age 33  . Ptvp   . Pacemaker insertion 07/04/11    by Dr Deborah Chalk  . Cholecystectomy   . Upper gastrointestinal endoscopy   . Joint replacement     rt total hip 2008  . Total knee arthroplasty 02/07/2012    Procedure: TOTAL KNEE ARTHROPLASTY;  Surgeon: Shelda Pal, MD;  Location: [facility];  Service: Orthopedics;  Laterality: Right;     Medications:  HOME MEDS: Prior to Admission medications   Medication Sig Start Date End Date Taking? Authorizing Provider  albuterol (PROVENTIL HFA;VENTOLIN HFA) 108 (90 BASE) MCG/ACT inhaler Inhale 1 puff into the lungs every 6 (six) hours as needed for wheezing or shortness of breath. 02/12/12 02/11/13 Yes Ripudeep Jenna Luo, MD  amiodarone (PACERONE) 200 MG tablet Take 200 mg by mouth every evening.    Yes Historical Provider, MD  amLODipine (NORVASC) 10 MG tablet Take 10 mg by mouth daily.   Yes Historical Provider, MD  atenolol (TENORMIN) 100 MG tablet Take 100 mg by mouth 2 (two) times daily.    Yes Historical Provider, MD  atorvastatin (LIPITOR) 40 MG tablet Take 20 mg by mouth See admin instructions.  Takes 1 tablet Monday-Friday   Yes Historical Provider, MD  calcium-vitamin D (OSCAL WITH D) 500-200 MG-UNIT per tablet Take 1 tablet by mouth daily.   Yes Historical Provider, MD  cholecalciferol (VITAMIN D) 1000 UNITS tablet Take 5,000 Units by mouth every morning.   Yes Historical Provider, MD  famotidine (PEPCID) 10 MG tablet Take 10 mg by mouth daily as needed. For acid reducer   Yes Historical Provider, MD  furosemide (LASIX) 40 MG tablet Take 40 mg by mouth 4 (four) times a week. Patrick North, sat 02/12/12  02/11/13 Yes Ripudeep Jenna Luo, MD  levothyroxine (SYNTHROID, LEVOTHROID) 100 MCG tablet Take 50 mcg by mouth See admin instructions. Take 1/2 tab daily except for saturdays and sundays   Yes Historical Provider, MD  losartan-hydrochlorothiazide (HYZAAR) 100-25 MG per tablet Take 1 tablet by mouth daily.    Yes Historical Provider, MD  Magnesium 250 MG TABS Take 1 tablet by mouth every Monday, Wednesday, and Friday.   Yes Historical Provider, MD  nitroGLYCERIN (NITRODUR - DOSED IN MG/24 HR) 0.4 mg/hr Place 1 patch onto the skin as needed. CHEST PAINS   Yes Historical Provider, MD  nitroGLYCERIN (NITROSTAT) 0.4 MG SL tablet Place 0.4 mg under the tongue every 5 (five) minutes as needed. CHEST PAINS   Yes Historical Provider, MD  potassium chloride SA (K-DUR,KLOR-CON) 20 MEQ tablet Take 1 tablet (20 mEq total) by mouth 3 (three) times daily. 07/13/11  Yes Cassell Clement, MD  pregabalin (LYRICA) 75 MG capsule Take 75 mg by mouth 2 (two) times daily.    Yes Historical Provider, MD  sertraline (ZOLOFT) 50 MG tablet Take 50 mg by mouth every evening.   Yes Historical Provider, MD  warfarin (COUMADIN) 2 MG tablet Take 1-2 mg by mouth daily. Take 2mg  every night except mon, wed, fri 1 mg 02/09/12  Yes Genelle Gather Marshall, PA    Allergies:  Allergies  Allergen Reactions  . Codeine Nausea And Vomiting  . Zinc     nausea    Social History:   reports that she has never smoked. She has never used smokeless tobacco. She reports that she does not drink alcohol or use illicit drugs.  Family History: Family History  Problem Relation Age of Onset  . Colon cancer Mother 33  . Heart disease Mother   . Heart disease Father   . Heart attack Father   . Heart disease Maternal Grandmother      Physical Exam:  GEN:  Pleasant 78 year old elderly well nourished and well developed Caucasian Female examined  and in no acute distress; cooperative with exam Filed Vitals:   08/25/12 2215 08/25/12 2230 08/25/12 2300  08/25/12 2300  BP: 160/54 160/56 147/59 147/59  Pulse: 65 60 63 63  Temp:      TempSrc:      Resp: 18 13 17 17   SpO2: 95% 96% 95%    Blood pressure 147/59, pulse 63, temperature 98 F (36.7 C), temperature source Oral, resp. rate 17, SpO2 95.00%. PSYCH: She is alert and oriented x4; does not appear anxious does not appear depressed; affect is normal HEENT: Normocephalic and Atraumatic, Mucous membranes pink; PERRLA; EOM intact; Fundi:  Benign;  No scleral icterus, Nares: Patent, Oropharynx: Clear, Edentulous with Dentures,   Neck:  FROM, no cervical lymphadenopathy nor thyromegaly or carotid bruit; no JVD; Breasts:: Not examined CHEST WALL: No tenderness CHEST: Normal respiration, clear to auscultation bilaterally HEART: Regular rate and rhythm; no murmurs rubs or gallops  BACK: No kyphosis or scoliosis; no CVA tenderness ABDOMEN: Positive Bowel Sounds,  Obese, soft non-tender; no masses, no organomegaly.   Rectal Exam: Not done EXTREMITIES: No bone or joint deformity; age-appropriate arthropathy of the hands and knees; no cyanosis, clubbing or edema; no ulcerations. Genitalia: not examined PULSES: 2+ and symmetric SKIN: Normal hydration no rash or ulceration CNS: Cranial nerves 2-12 grossly intact no focal neurologic deficit    Labs on Admission:  Basic Metabolic Panel:  Lab 08/25/12 1610  NA 141  K 3.4*  CL 105  CO2 24  GLUCOSE 120*  BUN 22  CREATININE 1.36*  CALCIUM 8.8  MG --  PHOS --   Liver Function Tests:  Lab 08/25/12 2108  AST 29  ALT 21  ALKPHOS 107  BILITOT 0.4  PROT 6.4  ALBUMIN 3.2*   No results found for this basename: LIPASE:5,AMYLASE:5 in the last 168 hours No results found for this basename: AMMONIA:5 in the last 168 hours CBC:  Lab 08/25/12 2108  WBC 6.2  NEUTROABS --  HGB 12.8  HCT 38.0  MCV 88.6  PLT 163   Cardiac Enzymes: No results found for this basename: CKTOTAL:5,CKMB:5,CKMBINDEX:5,TROPONINI:5 in the last 168 hours  BNP (last 3  results)  Basename 08/25/12 2108 02/16/12 0922 02/11/12 0500  PROBNP 1480.0* 88.0 4259.0*   CBG: No results found for this basename: GLUCAP:5 in the last 168 hours  Radiological Exams on Admission: Dg Chest 2 View  08/25/2012  *RADIOLOGY REPORT*  Clinical Data: Chest pain  CHEST - 2 VIEW  Comparison: 02/10/2012 CT  Findings: Right chest wall battery pack the tips project over the right atrium and right ventricle.  Heart size upper normal.  Aortic arch atherosclerosis.  Central vascular congestion. Mild interstitial prominence appears chronic.  Trace effusions not excluded.  No pneumothorax.  Surgical clips right upper quadrant.  IMPRESSION: Heart size upper normal.  Central vascular congestion.  No overt edema.   Original Report Authenticated By: Jearld Lesch, M.D.     EKG: Independently reviewed.   Assessment: Principal Problem:  *Chest pain Active Problems:  SOB (shortness of breath)  HYPOTHYROIDISM  HYPERLIPIDEMIA  HYPERTENSION  Atrial fibrillation  Aortic stenosis, moderate  Subtherapeutic international normalized ratio (INR)  Pacemaker    Plan:       Admit to Observation Telemetry Bed Cycle Cardiac Enzymes CHF Protocol, diurese with IV lasix, 2D ECHO ordered Nitropaste, O2, ASA therapy Reconcile Home Medication Pharmacy Adjustment of Coumadin   Code Status:  FULL CODE Family Communication:  Daughters at bedside Disposition Plan:  Return to Home  Time spent: 45 Minutes  Ron Parker [facility] Pager [phone]  If 7PM-7AM, please contact night-coverage [email or web address] Password [facility] 08/26/2012, 12:14 AM

## 2012-08-26 NOTE — Progress Notes (Signed)
  Echocardiogram 2D Echocardiogram has been performed.  [name] FRANCES 08/26/2012, 4:39 PM

## 2012-08-27 LAB — BASIC METABOLIC PANEL
CO2: 25 mEq/L (ref 19–32)
Calcium: 9.2 mg/dL (ref 8.4–10.5)
Creatinine, Ser: 1.33 mg/dL — ABNORMAL HIGH (ref 0.50–1.10)
GFR calc non Af Amer: 38 mL/min — ABNORMAL LOW (ref >90)
Sodium: 142 mEq/L (ref 135–145)

## 2012-08-27 LAB — PROTIME-INR
INR: 1.72 — ABNORMAL HIGH (ref 0.00–1.49)
Prothrombin Time: 19.6 seconds — ABNORMAL HIGH (ref 11.6–15.2)

## 2012-08-27 LAB — TSH: TSH: 1.959 u[IU]/mL (ref 0.350–4.500)

## 2012-08-27 MED ORDER — WARFARIN SODIUM 2.5 MG PO TABS
2.5000 mg | ORAL_TABLET | Freq: Once | ORAL | Status: AC
  Administered 2012-08-27: 2.5 mg via ORAL
  Filled 2012-08-27: qty 1

## 2012-08-27 MED ORDER — POTASSIUM CHLORIDE CRYS ER 20 MEQ PO TBCR
40.0000 meq | EXTENDED_RELEASE_TABLET | Freq: Every day | ORAL | Status: DC
  Administered 2012-08-27 – 2012-08-29 (×3): 40 meq via ORAL
  Filled 2012-08-27 (×4): qty 2

## 2012-08-27 MED ORDER — FUROSEMIDE 40 MG PO TABS: 40.0000 mg | ORAL_TABLET | Freq: Two times a day (BID) | ORAL | Status: DC

## 2012-08-27 MED ORDER — AMLODIPINE BESYLATE 10 MG PO TABS: 5.0000 mg | ORAL_TABLET | Freq: Every day | ORAL | Status: DC

## 2012-08-27 MED ORDER — POTASSIUM CHLORIDE CRYS ER 20 MEQ PO TBCR: 20.0000 meq | EXTENDED_RELEASE_TABLET | Freq: Two times a day (BID) | ORAL | Status: DC

## 2012-08-27 MED ORDER — AMLODIPINE BESYLATE 5 MG PO TABS
5.0000 mg | ORAL_TABLET | Freq: Every day | ORAL | Status: DC
  Administered 2012-08-27 – 2012-08-29 (×3): 5 mg via ORAL
  Filled 2012-08-27 (×3): qty 1

## 2012-08-27 MED ORDER — FUROSEMIDE 40 MG PO TABS
40.0000 mg | ORAL_TABLET | Freq: Two times a day (BID) | ORAL | Status: DC
  Administered 2012-08-27 – 2012-08-28 (×2): 40 mg via ORAL
  Filled 2012-08-27 (×4): qty 1

## 2012-08-27 NOTE — Progress Notes (Signed)
Telemetry monitors down periodically.  Rhythm confirmed post return of monitoring.  Pt remains A-paced at this time.  Will continue to monitor. Nino Glow RN

## 2012-08-27 NOTE — Progress Notes (Signed)
Pt K 3.1 this am.  MD has been made aware.  New orders given.  Will continue to monitor. Nino Glow RN

## 2012-08-27 NOTE — Progress Notes (Signed)
PROGRESS NOTE  [name] WUJ:[id number] DOB: [birth year] DOA: 08/25/2012 PCP: Nadean Corwin, MD  Brief narrative: Patient admitted with tightness in the chest, noted to be somewhat short of breath as well. She states that her blood pressure somewhat elevated yesterday and that she does not take daily Lasix rather she takes every couple of days when she feels swollen.   Past medical history-As per Problem list  Chart review  Admission 6.6.13 c Acute resp failure 2/2 fluid overload  Admit 02/07/12 for R TKR  Admit 03/29/11 c dyspnea on exertion, thought to be CHF  permanent pacemaker implanted 07/01/10  Admit 4.11.09 c PAF  nted h/o Transitional cell ca, Uterine Ca-had hyserectomy.nephrectomy  H/o open heart surgery age 63 for PDA  Admit 10/10/07 R hip pain-R TH replacement  L heart cath 10.6.08  Admit 7.15.08 c Hypovolemic syncopy, prolonged QTC + bradycardia  S/p radical nephrectomy 10.22.03  Consultants:  none  Procedures:  Echo 12/22=Ef60-65%, grade 2 dias dysfunction, mild-mod AoV stenosis  Antibiotics:  nad   Subjective  Patient feels better. She denies any further shortness of breath She has no chest pain at present No blurred vision no double vision. Tolerating diet well.   Objective    Interim History: None   Telemetry: nsr  Objective: Filed Vitals:   08/27/12 1305 08/27/12 1549 08/27/12 1550 08/27/12 1553  BP: 113/49 120/73 141/60 129/59  Pulse: 71 78 81 71  Temp: 97.7 F (36.5 C) 97.5 F (36.4 C) 97.5 F (36.4 C) 97.5 F (36.4 C)  TempSrc: Oral Oral Oral Oral  Resp: 18 18 18 18   Height:      Weight:      SpO2: 99% 98% 98% 99%    Intake/Output Summary (Last 24 hours) at 08/27/12 1622 Last data filed at 08/27/12 1305  Gross per 24 hour  Intake   1207 ml  Output   1300 ml  Net    -93 ml    Exam:  General: Alert pleasant oriented no apparent distress Cardiovascular: Sinus to murmur right upper sternal edge, telemetry  normal Respiratory: Clear to clear no added sound Abdomen: Soft nontender nondistended Skin no lower extremity edema Neuro motor intact  Data Reviewed: Basic Metabolic Panel:  Lab 08/27/12 9562 08/26/12 0500 08/25/12 2108  NA 142 144 141  K 3.1* 3.5 --  CL 101 106 105  CO2 25 25 24   GLUCOSE 101* 91 120*  BUN 27* 21 22  CREATININE 1.33* 1.28* 1.36*  CALCIUM 9.2 9.1 8.8  MG -- -- --  PHOS -- -- --   Liver Function Tests:  Lab 08/25/12 2108  AST 29  ALT 21  ALKPHOS 107  BILITOT 0.4  PROT 6.4  ALBUMIN 3.2*   No results found for this basename: LIPASE:5,AMYLASE:5 in the last 168 hours No results found for this basename: AMMONIA:5 in the last 168 hours CBC:  Lab 08/26/12 0500 08/25/12 2108  WBC 5.6 6.2  NEUTROABS -- --  HGB 13.3 12.8  HCT 39.6 38.0  MCV 88.8 88.6  PLT 163 163   Cardiac Enzymes:  Lab 08/26/12 1214 08/26/12 0834 08/25/12 2353  CKTOTAL 246* 266* 246*  CKMB 2.7 2.8 2.9  CKMBINDEX -- -- --  TROPONINI <0.30 <0.30 <0.30   BNP: No components found with this basename: POCBNP:5 CBG: No results found for this basename: GLUCAP:5 in the last 168 hours  No results found for this or any previous visit (from the past 240 hour(s)).   Studies:  All Imaging reviewed and is as per above notation   Scheduled Meds:   . amiodarone  200 mg Oral QPM  . amLODipine  5 mg Oral Daily  . aspirin EC  325 mg Oral Daily  . atenolol  100 mg Oral BID  . atorvastatin  20 mg Oral Custom  . calcium-vitamin D  1 tablet Oral Daily  . cholecalciferol  5,000 Units Oral q morning - 10a  . enoxaparin (LOVENOX) injection  40 mg Subcutaneous Q24H  . furosemide  40 mg Oral BID  . losartan  100 mg Oral Daily   And  . hydrochlorothiazide  25 mg Oral Daily  . levothyroxine  50 mcg Oral Custom  . magnesium oxide  400 mg Oral Q M,W,F  .  morphine injection  4 mg Intravenous Once  . ondansetron (ZOFRAN) IV  4 mg Intravenous Once  . potassium chloride  20 mEq Oral BID   . potassium chloride  40 mEq Oral Daily  . pregabalin  75 mg Oral BID  . sertraline  50 mg Oral QPM  . sodium chloride  3 mL Intravenous Q12H  . warfarin  2.5 mg Oral ONCE-1800  . Warfarin - Pharmacist Dosing Inpatient   Does not apply q1800   Continuous Infusions:   . sodium chloride       Assessment/Plan: 1. Chest pain-in the process of being ruled out. Likely related to #2. 2. Likely flash pulmonary edema-we will diuresis  with IV Lasix 40 mg every 12 hourly this was changed to oral Lasix 08/27/2029. Echocardiogram has been ordered to update. We will follow-if EF is significantly depressed, we will consult cardiology otherwise we will diuresis 3. Atrial fibrillation status post pacemaker placement-continue amiodarone 200 every afternoon, amlodipine 10 g daily, atenolol 100 twice a day-continue Coumadin-INR is 1.63 today. 4. Hypertension-continue losartan 100 daily, HCTZ 25 daily 5. Depression continue Zoloft 50 every afternoon. 6. Amiodarone use-get basic and TSH and LFTs in a.m-TSH is within normal limits we will get LFTs normal 7. Hypothyroidism-continue Synthroid 50 mcg daily.  Code Status: Full Family Communication: Discussed with multiple members in room Disposition Plan: Likely home in one to 2 days   Pleas Koch, MD  [facility] Pager [phone] 08/27/2012, 4:22 PM    LOS: 2 days

## 2012-08-27 NOTE — Progress Notes (Signed)
ANTICOAGULATION CONSULT NOTE - Follow Up Consult  Pharmacy Consult for Coumadin Indication: atrial fibrillation  Allergies  Allergen Reactions  . Codeine Nausea And Vomiting  . Zinc     nausea    Patient Measurements: Height: 5\' 1"  (154.9 cm) Weight: 138 lb 10.7 oz (62.9 kg) (bed) IBW/kg (Calculated) : 47.8   Vital Signs: Temp: 97.9 F (36.6 C) (12/22 0845) Temp src: Oral (12/22 0845) BP: 131/59 mmHg (12/22 1141) Pulse Rate: 65  (12/22 1141)  Labs:  Basename 08/27/12 1610 08/26/12 1214 08/26/12 0834 08/26/12 0500 08/25/12 2353 08/25/12 2108  HGB -- -- -- 13.3 -- 12.8  HCT -- -- -- 39.6 -- 38.0  PLT -- -- -- 163 -- 163  APTT -- -- -- -- -- --  LABPROT 19.6* -- -- 18.8* -- 19.6*  INR 1.72* -- -- 1.63* -- 1.72*  HEPARINUNFRC -- -- -- -- -- --  CREATININE 1.33* -- -- 1.28* -- 1.36*  CKTOTAL -- 246* 266* -- 246* --  CKMB -- 2.7 2.8 -- 2.9 --  TROPONINI -- <0.30 <0.30 -- <0.30 --    Estimated Creatinine Clearance: 30.6 ml/min (by C-G formula based on Cr of 1.33).  Assessment: 78 yof continuing on chronic Coumadin for Afib. INR is subtherapeutic this morning. Per Coumadin clinic notes, patient has been fairly stable on PTA regimen (2mg  daily except 1mg  MWF) with INR of 2.4 on 12/16.  Patient is also receiving Lovenox 40mg  daily. - H/H and Plts wnl - No significant bleeding reported  Goal of Therapy:  INR 2-3   Plan:  1. Repeat Coumadin 2.5mg  po x 1 today 2. Follow-up AM INR 3. Lovenox until INR >= 2  [facility], [address].D., BCPS Clinical Pharmacist Pager: [phone] 08/27/2012 12:54 PM

## 2012-08-28 ENCOUNTER — Telehealth: Payer: Self-pay | Admitting: Cardiology

## 2012-08-28 DIAGNOSIS — I509 Heart failure, unspecified: Secondary | ICD-10-CM

## 2012-08-28 LAB — BASIC METABOLIC PANEL
BUN: 34 mg/dL — ABNORMAL HIGH (ref 6–23)
CO2: 25 mEq/L (ref 19–32)
Calcium: 9.1 mg/dL (ref 8.4–10.5)
Chloride: 98 mEq/L (ref 96–112)
Creatinine, Ser: 1.56 mg/dL — ABNORMAL HIGH (ref 0.50–1.10)
GFR calc Af Amer: 36 mL/min — ABNORMAL LOW (ref >90)

## 2012-08-28 LAB — PROTIME-INR: Prothrombin Time: 21.9 seconds — ABNORMAL HIGH (ref 11.6–15.2)

## 2012-08-28 MED ORDER — POTASSIUM CHLORIDE CRYS ER 20 MEQ PO TBCR
40.0000 meq | EXTENDED_RELEASE_TABLET | Freq: Once | ORAL | Status: AC
  Administered 2012-08-28: 40 meq via ORAL

## 2012-08-28 MED ORDER — ENOXAPARIN SODIUM 30 MG/0.3ML ~~LOC~~ SOLN
30.0000 mg | SUBCUTANEOUS | Status: DC
  Administered 2012-08-28: 30 mg via SUBCUTANEOUS
  Filled 2012-08-28 (×2): qty 0.3

## 2012-08-28 MED ORDER — WARFARIN SODIUM 1 MG PO TABS
1.0000 mg | ORAL_TABLET | Freq: Once | ORAL | Status: AC
  Administered 2012-08-28: 1 mg via ORAL
  Filled 2012-08-28: qty 1

## 2012-08-28 MED ORDER — FUROSEMIDE 40 MG PO TABS
40.0000 mg | ORAL_TABLET | Freq: Every day | ORAL | Status: DC
  Administered 2012-08-29: 40 mg via ORAL
  Filled 2012-08-28: qty 1

## 2012-08-28 NOTE — Telephone Encounter (Signed)
New Problem:    I called the patient and was unable to reach them. I left a message on their voicemail with my name, the reason I called, the name of their physician, and a number to call back to schedule their follow-up E[facility] appointment.

## 2012-08-28 NOTE — Progress Notes (Signed)
ANTICOAGULATION CONSULT NOTE - Follow Up Consult  Pharmacy Consult for Coumadin Indication: atrial fibrillation  Allergies  Allergen Reactions  . Codeine Nausea And Vomiting  . Zinc     nausea    Patient Measurements: Height: 5\' 1"  (154.9 cm) Weight: 140 lb 3.2 oz (63.594 kg) ( c scale) IBW/kg (Calculated) : 47.8   Vital Signs: Temp: 98.2 F (36.8 C) (12/23 0613) Temp src: Oral (12/23 0613) BP: 100/66 mmHg (12/23 0618) Pulse Rate: 73  (12/23 0618)  Labs:  Alvira Philips 08/28/12 0520 08/27/12 7829 08/26/12 1214 08/26/12 0834 08/26/12 0500 08/25/12 2353 08/25/12 2108  HGB -- -- -- -- 13.3 -- 12.8  HCT -- -- -- -- 39.6 -- 38.0  PLT -- -- -- -- 163 -- 163  APTT -- -- -- -- -- -- --  LABPROT 21.9* 19.6* -- -- 18.8* -- --  INR 2.00* 1.72* -- -- 1.63* -- --  HEPARINUNFRC -- -- -- -- -- -- --  CREATININE 1.56* 1.33* -- -- 1.28* -- --  CKTOTAL -- -- 246* 266* -- 246* --  CKMB -- -- 2.7 2.8 -- 2.9 --  TROPONINI -- -- <0.30 <0.30 -- <0.30 --    Estimated Creatinine Clearance: 26.2 ml/min (by C-G formula based on Cr of 1.56).  Assessment: 78yof continues on coumadin for afib. INR is therapeutic today after 2.5mg  x 3 doses given. No bleeding reported. No CBC today. Continues on amiodarone as pta. Will try to re-initiate home regimen.  Goal of Therapy:  INR 2-3 Monitor platelets by anticoagulation protocol: Yes   Plan:  1) Coumadin 1mg  po x 1 tonight 2) Change lovenox to 30mg  sq q24 for CrCl < 86ml/min and d/c 12/24 if INR > 2 3) Follow up INR in AM  Fredrik Rigger 08/28/2012,9:11 AM

## 2012-08-28 NOTE — Progress Notes (Addendum)
[facility] PROGRESS NOTE  Valerie STAKES ZOX:[id number] DOB: 03-22-1934 DOA: 08/25/2012 PCP: Nadean Corwin, MD  Assessment/Plan: Principal Problem:  *Chest pain Active Problems:  HYPOTHYROIDISM  HYPERLIPIDEMIA  HYPERTENSION  Atrial fibrillation  Aortic stenosis, moderate  SOB (shortness of breath)  Subtherapeutic international normalized ratio (INR)  Pacemaker    1. Diastolic CHF: Patient presented with SOB as well as chest tightness, and CXR demonstrated central vascular congestion, but no overt pul;monary edema. ProBNP was mildly elevated at 1480. 2D Echocardiogram showed normal LV cavity size was normal, mild LVH, EF of 60% to 65% and no regional wall motion abnormalities. There was grade 2 diastolic dysfunction, mild to moderate aortic stenosis and mild to moderate regurgitation. Valve area: 0.82cm^2(VTI). Valve area: 0.8cm^2 (Vmax). Mitral valve: Calcified annulus. The LA was mildly dilated. Patient was managed with diuresis, with satisfactory clinical response. As of 08/28/12, she was asymptomatic. She is now on daily oral Lasix. Have discussed in detail on 08/28/12 with Dr Sherryl Manges, [facility] Cardiologist, he concurs with current management, and will arrange cardiology follow up on discharge.  2. Chest pain: This was due to #1 above, and has since resolved. 12-Lead EKG showed no acute ischemic changes, cardiac enzymes remained un-elevated, and 2D Echocardiogram showed no regional wall motion abnormalities. Clearly, patient did not have ACS. She has been reassured accordingly.  3. Atrial fibrillation: Patient has known history of PAF/Tachy-Brady syndrome, s/p PPM. She has remained in SR during her hospitalization, on Amiodarone/Atenolol. Continued on Coumadin, under pharmacy supervision. Aspirin has been discontinued, on recommendation of Dr Graciela Husbands.  4. Hypertension: Controlled. Will continue Losartan, but have discontinued HCTZ, in view of hypokalemia. 5. 5.  Depression: Stable on Zoloft. 5. Hypothyroidism: Continued on thyroxine replacement therapy.  6. Hypokalemia: Secondary to diuretics. Repleted as indicated. HCTZ discontinued. Will need close follow up of electrolytes by PMD.   Code Status: Full Code.  Family Communication:  Disposition Plan: To be determined. Aiming discharge later today   Brief narrative: 78 y.o. female with history of CAD, PAF, tachy-Brady syndrome, on chronic anticoagulation, s/p PPM, HTN, moderate aortic stenosis, dyslipidemia, hypothyroidism, GERD, CKD, left RCC, s/p Nephrectomy, Uterine Ca, s/p hysterectomy, OA s/p Rt TKR, peripheral neuropathy, RLS, migraines, who presents with a 4-day history of intermittent chest discomfort and SOB. She describes having chest tightness and a feeling as if an elephant was sitting on her chest and the discomfort has been associated with nausea, SOB and diaphoresis. The pain worsened on 08/26/12 and was an 9/10. She took 3 SL NTG and had only mild improvement in the pain, so she came to the ED, and was admitted for further evaluation and management.    Consultants:  Telephone discussion with Dr Sherryl Manges, Cardiologist, on 08/28/12.   Procedures:  CXR.   Antibiotics:  N/A.   HPI/Subjective: Asymptomatic.   Objective: Vital signs in last 24 hours: Temp:  [97.5 F (36.4 C)-98.2 F (36.8 C)] 98.2 F (36.8 C) (12/23 8119) Pulse Rate:  [63-82] 73  (12/23 0618) Resp:  [18-19] 19  (12/23 0613) BP: (100-141)/(47-73) 100/66 mmHg (12/23 0618) SpO2:  [94 %-99 %] 94 % (12/23 0613) Weight:  [63.594 kg (140 lb 3.2 oz)] 63.594 kg (140 lb 3.2 oz) (12/23 1478) Weight change: 0.694 kg (1 lb 8.5 oz) Last BM Date: 08/27/12  Intake/Output from previous day: 12/22 0701 - 12/23 0700 In: 1203 [P.O.:1200; I.V.:3] Out: 1825 [Urine:1825]     Physical Exam: General: Comfortable, alert, communicative, fully oriented, not short of breath at rest.  HEENT:  No clinical pallor, no  jaundice, no conjunctival injection or discharge. NECK:  Supple, JVP not seen, no carotid bruits, no palpable lymphadenopathy, no palpable goiter. CHEST:  Clinically clear to auscultation, no wheezes, no crackles. HEART:  Sounds 1 and 2 heard, normal, regular, no murmurs. ABDOMEN:  Full, soft, non-tender, no palpable organomegaly, no palpable masses, normal bowel sounds. GENITALIA:  Not examined. LOWER EXTREMITIES:  No pitting edema, palpable peripheral pulses. MUSCULOSKELETAL SYSTEM:  Generalized osteoarthritic changes, otherwise, normal. CENTRAL NERVOUS SYSTEM:  No focal neurologic deficit on gross examination.  Lab Results:  Basename 08/26/12 0500 08/25/12 2108  WBC 5.6 6.2  HGB 13.3 12.8  HCT 39.6 38.0  PLT 163 163    Basename 08/28/12 0520 08/27/12 0613  NA 138 142  K 2.8* 3.1*  CL 98 101  CO2 25 25  GLUCOSE 83 101*  BUN 34* 27*  CREATININE 1.56* 1.33*  CALCIUM 9.1 9.2   No results found for this or any previous visit (from the past 240 hour(s)).   Studies/Results: No results found.  Medications: Scheduled Meds:   . amiodarone  200 mg Oral QPM  . amLODipine  5 mg Oral Daily  . aspirin EC  325 mg Oral Daily  . atenolol  100 mg Oral BID  . atorvastatin  20 mg Oral Custom  . calcium-vitamin D  1 tablet Oral Daily  . cholecalciferol  5,000 Units Oral q morning - 10a  . enoxaparin  30 mg Subcutaneous Q24H  . furosemide  40 mg Oral BID  . losartan  100 mg Oral Daily   And  . hydrochlorothiazide  25 mg Oral Daily  . levothyroxine  50 mcg Oral Custom  . magnesium oxide  400 mg Oral Q M,W,F  .  morphine injection  4 mg Intravenous Once  . ondansetron (ZOFRAN) IV  4 mg Intravenous Once  . potassium chloride  40 mEq Oral Daily  . pregabalin  75 mg Oral BID  . sertraline  50 mg Oral QPM  . sodium chloride  3 mL Intravenous Q12H  . warfarin  1 mg Oral ONCE-1800  . Warfarin - Pharmacist Dosing Inpatient   Does not apply q1800   Continuous Infusions:   . sodium  chloride     PRN Meds:.sodium chloride, acetaminophen, acetaminophen, acetaminophen, albuterol, alum & mag hydroxide-simeth, famotidine, HYDROmorphone (DILAUDID) injection, nitroGLYCERIN, ondansetron (ZOFRAN) IV, ondansetron, oxyCODONE, sodium chloride, zolpidem    LOS: 3 days   [name],CHRISTOPHER  [facility] Pager [phone]. If 8PM-8AM, please contact night-coverage at [email or web address], password [facility] 08/28/2012, 10:18 AM  LOS: 3 days

## 2012-08-29 LAB — BASIC METABOLIC PANEL
BUN: 35 mg/dL — ABNORMAL HIGH (ref 6–23)
CO2: 26 mEq/L (ref 19–32)
Chloride: 103 mEq/L (ref 96–112)
Creatinine, Ser: 1.44 mg/dL — ABNORMAL HIGH (ref 0.50–1.10)
GFR calc Af Amer: 40 mL/min — ABNORMAL LOW (ref >90)
Glucose, Bld: 92 mg/dL (ref 70–99)
Potassium: 4.4 mEq/L (ref 3.5–5.1)

## 2012-08-29 LAB — PROTIME-INR: Prothrombin Time: 22.6 seconds — ABNORMAL HIGH (ref 11.6–15.2)

## 2012-08-29 MED ORDER — WARFARIN SODIUM 2 MG PO TABS
2.0000 mg | ORAL_TABLET | Freq: Once | ORAL | Status: DC
  Filled 2012-08-29: qty 1

## 2012-08-29 MED ORDER — FUROSEMIDE 40 MG PO TABS: 40.0000 mg | ORAL_TABLET | Freq: Every day | ORAL | Status: DC

## 2012-08-29 MED ORDER — POTASSIUM CHLORIDE CRYS ER 20 MEQ PO TBCR: 20.0000 meq | EXTENDED_RELEASE_TABLET | Freq: Two times a day (BID) | ORAL | Status: DC

## 2012-08-29 MED ORDER — AMLODIPINE BESYLATE 10 MG PO TABS: 5.0000 mg | ORAL_TABLET | Freq: Every day | ORAL | Status: DC

## 2012-08-29 NOTE — Progress Notes (Signed)
ANTICOAGULATION CONSULT NOTE - Follow Up Consult  Pharmacy Consult for Coumadin Indication: atrial fibrillation  Allergies  Allergen Reactions  . Codeine Nausea And Vomiting  . Zinc     nausea    Patient Measurements: Height: 5\' 1"  (154.9 cm) Weight: 141 lb 1.6 oz (64.003 kg) (c scale) IBW/kg (Calculated) : 47.8   Vital Signs: Temp: 97.9 F (36.6 C) (12/24 0457) Temp src: Oral (12/24 0457) BP: 142/72 mmHg (12/24 0504) Pulse Rate: 82  (12/24 0504)  Labs:  Basename 08/29/12 0500 08/28/12 0520 08/27/12 0613 08/26/12 1214  HGB -- -- -- --  HCT -- -- -- --  PLT -- -- -- --  APTT -- -- -- --  LABPROT 22.6* 21.9* 19.6* --  INR 2.09* 2.00* 1.72* --  HEPARINUNFRC -- -- -- --  CREATININE 1.44* 1.56* 1.33* --  CKTOTAL -- -- -- 246*  CKMB -- -- -- 2.7  TROPONINI -- -- -- <0.30    Estimated Creatinine Clearance: 28.5 ml/min (by C-G formula based on Cr of 1.44).  Assessment: 78yof continues on coumadin for afib. INR remains therapeutic but is essentially unchanged from yesterday. Gave 1mg  last night to try and re-intiate home regimen. No bleeding reported. No CBC today. Continues on amiodarone as pta.  Goal of Therapy:  INR 2-3 Monitor platelets by anticoagulation protocol: Yes   Plan:  1) Increase coumadin to 2mg  x 1 tonight 2) Follow up INR in AM  Fredrik Rigger 08/29/2012,8:55 AM

## 2012-08-29 NOTE — Discharge Summary (Signed)
Physician Discharge Summary  KIRSTYN LEAN AOZ:[id number] DOB: 1933-09-27 DOA: 08/25/2012  PCP: Nadean Corwin, MD  Admit date: 08/25/2012 Discharge date: 08/29/2012  Time spent: 65 minutes  Recommendations for Outpatient Follow-up:  1. Patient is to follow up in Coumadin clinic on 09/18/2012. 2. Patient is to follow up with Dr. Oneta Rack in one week a followup she'll need a basic metabolic profile done to follow up on electrolytes and renal function. 3. Patient is to follow up with Dr. Patty Sermons 1-2 weeks.  Discharge Diagnoses:  Principal Problem:  *Chest pain Active Problems:  HYPOTHYROIDISM  HYPERLIPIDEMIA  HYPERTENSION  Atrial fibrillation  Aortic stenosis, moderate  SOB (shortness of breath)  Subtherapeutic international normalized ratio (INR)  Pacemaker   Discharge Condition: stable and improved  Diet recommendation: heart healthy  Filed Weights   08/27/12 0615 08/28/12 0613 08/29/12 0457  Weight: 62.9 kg (138 lb 10.7 oz) 63.594 kg (140 lb 3.2 oz) 64.003 kg (141 lb 1.6 oz)    History of present illness:  78 y.o. female with history of CAD, PAF, tachy-Brady syndrome, on chronic anticoagulation, s/p PPM, HTN, moderate aortic stenosis, dyslipidemia, hypothyroidism, GERD, CKD, left RCC, s/p Nephrectomy, Uterine Ca, s/p hysterectomy, OA s/p Rt TKR, peripheral neuropathy, RLS, migraines, who presents with a 4-day history of intermittent chest discomfort and SOB. She describes having chest tightness and a feeling as if an elephant was sitting on her chest and the discomfort has been associated with nausea, SOB and diaphoresis. The pain worsened on 08/26/12 and was an 9/10. She took 3 SL NTG and had only mild improvement in the pain, so she came to the ED, and was admitted for further evaluation and management   Hospital Course:  1. Diastolic CHF: Patient presented with SOB as well as chest tightness, and CXR demonstrated central vascular congestion, but no overt  pul;monary edema. ProBNP was mildly elevated at 1480. 2D Echocardiogram showed normal LV cavity size was normal, mild LVH, EF of 60% to 65% and no regional wall motion abnormalities. There was grade 2 diastolic dysfunction, mild to moderate aortic stenosis and mild to moderate regurgitation. Valve area: 0.82cm^2(VTI). Valve area: 0.8cm^2 (Vmax). Mitral valve: Calcified annulus. The LA was mildly dilated. Patient was managed with diuresis, with satisfactory clinical response. As of 08/28/12, she was asymptomatic. She was placed on daily oral Lasix. Dr Brien Few discussed in detail on 08/28/12 with Dr Sherryl Manges, Corinda [facility] Cardiologist, he concurs with current management, and will arrange cardiology follow up on discharge.  2. Chest pain: This was due to #1 above, and has since resolved. 12-Lead EKG showed no acute ischemic changes, cardiac enzymes remained un-elevated, and 2D Echocardiogram showed no regional wall motion abnormalities. Clearly, patient did not have ACS. She has been reassured accordingly.  3. Atrial fibrillation: Patient has known history of PAF/Tachy-Brady syndrome, s/p PPM. She has remained in SR during her hospitalization, on Amiodarone/Atenolol. Continued on Coumadin, under pharmacy supervision. Aspirin has been discontinued, on recommendation of Dr Graciela Husbands.  4. Hypertension: Controlled. Patient was continued on Losartan, but have discontinued HCTZ, in view of hypokalemia. 5. 5. Depression: Stable on Zoloft.  5. Hypothyroidism: Continued on thyroxine replacement therapy.  6. Hypokalemia: Secondary to diuretics. Repleted as indicated. HCTZ discontinued. Will need close follow up of electrolytes by PMD.      Procedures: CXR.    Consultations: Telephone discussion with Dr Sherryl Manges, Cardiologist, on 08/28/12.   Discharge Exam: Filed Vitals:   08/29/12 0459 08/29/12 0504 08/29/12 0945 08/29/12 1252  BP: 130/57 142/72  132/59 111/65  Pulse: 71 82 62 64  Temp:   98 F (36.7 C) 97.3  F (36.3 C)  TempSrc:    Oral  Resp:    18  Height:      Weight:      SpO2:    96%    General: NAD Cardiovascular: RRR Respiratory: CTAB  Discharge Instructions  Discharge Orders    Future Appointments: Provider: Department: Dept Phone: Center:   09/18/2012 4:30 PM [facility] [phone] None   10/30/2012 10:25 AM [facility] [place] [employer] Main [facility] [place]) [phone] [facility]     Future Orders Please Complete By Expires   Diet - low sodium heart healthy      Diet - low sodium heart healthy      Increase activity slowly      Call MD for:  temperature >100.4      Call MD for:  redness, tenderness, or signs of infection (pain, swelling, redness, odor or green/yellow discharge around incision site)      Call MD for:  difficulty breathing, headache or visual disturbances      Call MD for:  persistant dizziness or light-headedness      Increase activity slowly      Discharge instructions      Comments:   Follow up with Nadean Corwin, MD in 1 week Follow up with Dr Patty Sermons in 1-2 weeks       Medication List     As of 08/29/2012  1:40 PM    STOP taking these medications         nitroGLYCERIN 0.4 MG SL tablet   Commonly known as: NITROSTAT      nitroGLYCERIN 0.4 mg/hr   Commonly known as: NITRODUR - Dosed in mg/24 hr      TAKE these medications         albuterol 108 (90 BASE) MCG/ACT inhaler   Commonly known as: PROVENTIL HFA;VENTOLIN HFA   Inhale 1 puff into the lungs every 6 (six) hours as needed for wheezing or shortness of breath.      amiodarone 200 MG tablet   Commonly known as: PACERONE   Take 200 mg by mouth every evening.      amLODipine 10 MG tablet   Commonly known as: NORVASC   Take 0.5 tablets (5 mg total) by mouth daily.      atenolol 100 MG tablet   Commonly known as: TENORMIN   Take 100 mg by mouth 2 (two) times daily.      atorvastatin 40 MG tablet    Commonly known as: LIPITOR   Take 20 mg by mouth See admin instructions. Takes 1 tablet Monday-Friday      calcium-vitamin D 500-200 MG-UNIT per tablet   Commonly known as: OSCAL WITH D   Take 1 tablet by mouth daily.      cholecalciferol 1000 UNITS tablet   Commonly known as: VITAMIN D   Take 5,000 Units by mouth every morning.      famotidine 10 MG tablet   Commonly known as: PEPCID   Take 10 mg by mouth daily as needed. For acid reducer      furosemide 40 MG tablet   Commonly known as: LASIX   Take 1 tablet (40 mg total) by mouth daily.      levothyroxine 100 MCG tablet   Commonly known as: SYNTHROID, LEVOTHROID   Take 50 mcg by mouth See admin instructions. Take 1/2 tab  daily except for saturdays and sundays      losartan-hydrochlorothiazide 100-25 MG per tablet   Commonly known as: HYZAAR   Take 1 tablet by mouth daily.      Magnesium 250 MG Tabs   Take 1 tablet by mouth every Monday, Wednesday, and Friday.      potassium chloride SA 20 MEQ tablet   Commonly known as: K-DUR,KLOR-CON   Take 1 tablet (20 mEq total) by mouth 2 (two) times daily.      pregabalin 75 MG capsule   Commonly known as: LYRICA   Take 75 mg by mouth 2 (two) times daily.      sertraline 50 MG tablet   Commonly known as: ZOLOFT   Take 50 mg by mouth every evening.      warfarin 2 MG tablet   Commonly known as: COUMADIN   Take 1-2 mg by mouth daily. Take 2mg  every night except mon, wed, fri 1 mg           Follow-up Information    Follow up with Nadean Corwin, MD. Schedule an appointment as soon as possible for a visit in 1 week.   Contact information:   [address] [place] [address] [phone]       Follow up with Cassell Clement, MD. In 2 weeks.   Contact information:   [address] [place] [address] [phone]           The results of significant diagnostics from this hospitalization (including imaging, microbiology,  ancillary and laboratory) are listed below for reference.    Significant Diagnostic Studies: Dg Chest 2 View  08/25/2012  *RADIOLOGY REPORT*  Clinical Data: Chest pain  CHEST - 2 VIEW  Comparison: 02/10/2012 CT  Findings: Right chest wall battery pack the tips project over the right atrium and right ventricle.  Heart size upper normal.  Aortic arch atherosclerosis.  Central vascular congestion. Mild interstitial prominence appears chronic.  Trace effusions not excluded.  No pneumothorax.  Surgical clips right upper quadrant.  IMPRESSION: Heart size upper normal.  Central vascular congestion.  No overt edema.   Original Report Authenticated By: Jearld Lesch, M.D.     Microbiology: No results found for this or any previous visit (from the past 240 hour(s)).   Labs: Basic Metabolic Panel:  Lab 08/29/12 0272 08/28/12 1616 08/28/12 0520 08/27/12 0613 08/26/12 0500 08/25/12 2108  NA 139 -- 138 142 144 141  K 4.4 3.3* 2.8* 3.1* 3.5 --  CL 103 -- 98 101 106 105  CO2 26 -- 25 25 25 24   GLUCOSE 92 -- 83 101* 91 120*  BUN 35* -- 34* 27* 21 22  CREATININE 1.44* -- 1.56* 1.33* 1.28* 1.36*  CALCIUM 9.4 -- 9.1 9.2 9.1 8.8  MG -- -- -- -- -- --  PHOS -- -- -- -- -- --   Liver Function Tests:  Lab 08/25/12 2108  AST 29  ALT 21  ALKPHOS 107  BILITOT 0.4  PROT 6.4  ALBUMIN 3.2*   No results found for this basename: LIPASE:5,AMYLASE:5 in the last 168 hours No results found for this basename: AMMONIA:5 in the last 168 hours CBC:  Lab 08/26/12 0500 08/25/12 2108  WBC 5.6 6.2  NEUTROABS -- --  HGB 13.3 12.8  HCT 39.6 38.0  MCV 88.8 88.6  PLT 163 163   Cardiac Enzymes:  Lab 08/26/12 1214 08/26/12 0834 08/25/12 2353  CKTOTAL 246* 266* 246*  CKMB 2.7 2.8 2.9  CKMBINDEX -- -- --  TROPONINI <0.30 <0.30 <0.30   BNP: BNP (last 3 results)  Basename 08/28/12 1044 08/25/12 2108 02/16/12 0922  PROBNP 234.4 1480.0* 88.0   CBG: No results found for this basename: GLUCAP:5 in the last  168 hours     Signed:  Anea Fodera  [facility] 08/29/2012, 1:40 PM

## 2012-08-29 NOTE — Progress Notes (Signed)
[name] to be D/C'd Home per MD order.  Discussed with the patient and all questions fully answered.   [name], [name]  Home Medication Instructions AVW:[id number]   Printed on:08/29/12 1542  Medication Information                    losartan-hydrochlorothiazide (HYZAAR) 100-25 MG per tablet Take 1 tablet by mouth daily.            pregabalin (LYRICA) 75 MG capsule Take 75 mg by mouth 2 (two) times daily.            levothyroxine (SYNTHROID, LEVOTHROID) 100 MCG tablet Take 50 mcg by mouth See admin instructions. Take 1/2 tab daily except for saturdays and sundays           atorvastatin (LIPITOR) 40 MG tablet Take 20 mg by mouth See admin instructions. Takes 1 tablet Monday-Friday           amiodarone (PACERONE) 200 MG tablet Take 200 mg by mouth every evening.            atenolol (TENORMIN) 100 MG tablet Take 100 mg by mouth 2 (two) times daily.            calcium-vitamin D (OSCAL WITH D) 500-200 MG-UNIT per tablet Take 1 tablet by mouth daily.           warfarin (COUMADIN) 2 MG tablet Take 1-2 mg by mouth daily. Take 2mg  every night except mon, wed, fri 1 mg           albuterol (PROVENTIL HFA;VENTOLIN HFA) 108 (90 BASE) MCG/ACT inhaler Inhale 1 puff into the lungs every 6 (six) hours as needed for wheezing or shortness of breath.           cholecalciferol (VITAMIN D) 1000 UNITS tablet Take 5,000 Units by mouth every morning.           sertraline (ZOLOFT) 50 MG tablet Take 50 mg by mouth every evening.           famotidine (PEPCID) 10 MG tablet Take 10 mg by mouth daily as needed. For acid reducer           Magnesium 250 MG TABS Take 1 tablet by mouth every Monday, Wednesday, and Friday.           amLODipine (NORVASC) 10 MG tablet Take 0.5 tablets (5 mg total) by mouth daily.           furosemide (LASIX) 40 MG tablet Take 1 tablet (40 mg total) by mouth daily.           potassium chloride SA (K-DUR,KLOR-CON) 20 MEQ tablet Take 1 tablet (20 mEq total) by  mouth 2 (two) times daily.             VVS, Skin clean, dry and intact without evidence of skin break down, no evidence of skin tears noted. IV catheter discontinued intact. Site without signs and symptoms of complications. Dressing and pressure applied.  An After Visit Summary was printed and given to the patient. Patient escorted via WC, and D/C home via private auto with daughter.  Ansen Sayegh 08/29/2012 3:42 PM

## 2012-09-14 ENCOUNTER — Ambulatory Visit (INDEPENDENT_AMBULATORY_CARE_PROVIDER_SITE_OTHER): Payer: 59 | Admitting: Cardiology

## 2012-09-14 ENCOUNTER — Ambulatory Visit (INDEPENDENT_AMBULATORY_CARE_PROVIDER_SITE_OTHER): Payer: 59 | Admitting: Pharmacist

## 2012-09-14 ENCOUNTER — Encounter: Payer: Self-pay | Admitting: Cardiology

## 2012-09-14 VITALS — BP 110/70 | HR 60 | Wt 145.8 lb

## 2012-09-14 DIAGNOSIS — I509 Heart failure, unspecified: Secondary | ICD-10-CM

## 2012-09-14 DIAGNOSIS — R5381 Other malaise: Secondary | ICD-10-CM

## 2012-09-14 DIAGNOSIS — I4891 Unspecified atrial fibrillation: Secondary | ICD-10-CM

## 2012-09-14 NOTE — Assessment & Plan Note (Signed)
The patient complains of malaise and fatigue.  She is on long-term Coumadin.  She has not been aware of any melena or hematochezia.  We're checking a CBC and thyroid function today to evaluate her fatigue further

## 2012-09-14 NOTE — Patient Instructions (Addendum)
Will obtain labs today and call you with the results (cbc/tsh/bmet)  Your physician recommends that you continue on your current medications as directed. Please refer to the Current Medication list given to you today.  Your physician recommends that you schedule a follow-up appointment in: 3 months ov/lp/bmet/hfp

## 2012-09-14 NOTE — Progress Notes (Signed)
[name] Date of Birth:  03/13/1934 [facility] [address] [place], [place]  [address] [phone]         Fax   [phone]  History of Present Illness: This pleasant 79 year old woman is seen for a post hospital followup office visit. She has a past history of tachybradycardia syndrome . She has a permanent pacemaker implanted 07/01/10 .  She has a remote history of repair of a patent ductus arteriosus.  She has a history of moderate valvular aortic stenosis.  She has had a history of congestive heart failure.  Most recent admission was 08/25/12-08/29/12 for exacerbation of CHF.  Her to go into the hospital she had been on Lasix only 3 times a week and she was sent out on daily Lasix 40 mg daily.    Current Outpatient Prescriptions  Medication Sig Dispense Refill  . albuterol (PROVENTIL HFA;VENTOLIN HFA) 108 (90 BASE) MCG/ACT inhaler Inhale 1 puff into the lungs every 6 (six) hours as needed for wheezing or shortness of breath.  1 Inhaler  3  . amiodarone (PACERONE) 200 MG tablet Take 200 mg by mouth every evening.       [name] amLODipine (NORVASC) 10 MG tablet Take 0.5 tablets (5 mg total) by mouth daily.      [name] atenolol (TENORMIN) 100 MG tablet Take 100 mg by mouth 2 (two) times daily.       [name] atorvastatin (LIPITOR) 40 MG tablet Take 20 mg by mouth See admin instructions. Takes 1 tablet Monday-Friday      . calcium-vitamin D (OSCAL WITH D) 500-200 MG-UNIT per tablet Take 1 tablet by mouth daily.      . cholecalciferol (VITAMIN D) 1000 UNITS tablet Take 5,000 Units by mouth every morning.      . famotidine (PEPCID) 10 MG tablet Take 10 mg by mouth daily as needed. For acid reducer      . furosemide (LASIX) 40 MG tablet Take 1 tablet (40 mg total) by mouth daily.  30 tablet  0  . levothyroxine (SYNTHROID, LEVOTHROID) 100 MCG tablet Take 50 mcg by mouth See admin instructions. Take 1/2 tab daily except for saturdays and sundays      . losartan-hydrochlorothiazide  (HYZAAR) 100-25 MG per tablet Take 1 tablet by mouth daily.       . Magnesium 250 MG TABS Take 1 tablet by mouth every Monday, Wednesday, and Friday.      . potassium chloride SA (K-DUR,KLOR-CON) 20 MEQ tablet Take 1 tablet (20 mEq total) by mouth 2 (two) times daily.  60 tablet  0  . pregabalin (LYRICA) 75 MG capsule Take 75 mg by mouth 2 (two) times daily.       . sertraline (ZOLOFT) 50 MG tablet Take 50 mg by mouth every evening.      . warfarin (COUMADIN) 2 MG tablet Take 1-2 mg by mouth daily. Take 2mg  every night except mon, wed, fri 1 mg        Allergies  Allergen Reactions  . Codeine Nausea And Vomiting  . Zinc     nausea    Patient Active Problem List  Diagnosis  . RENAL CELL CANCER  . BENIGN NEOPLASM OTH&UNSPEC SITE DIGESTIVE SYSTEM  . HYPOTHYROIDISM  . HYPERLIPIDEMIA  . ANXIETY  . DEPRESSION  . RESTLESS LEG SYNDROME  . HYPERTENSION  . Gastroparesis  . RADIATION PROCTITIS  . FIBROCYSTIC BREAST DISEASE  . OSTEOMYELITIS  . CARCINOMA, UTERUS, HX OF  . RADIATION THERAPY, HX  OF  . Atrial fibrillation  . Tachycardia-bradycardia syndrome  . S/P repair of patent ductus arteriosus  . Osteoarthritis  . Aortic stenosis, moderate  . Family history of malignant neoplasm of gastrointestinal tract  . Benign neoplasm of stomach  . Diarrhea  . S/P right TKA  . Acute respiratory failure with hypoxia  . S/P total knee arthroplasty  . CHF, acute on chronic  . Chest pain  . SOB (shortness of breath)  . Subtherapeutic international normalized ratio (INR)  . Pacemaker  . Malaise and fatigue    History  Smoking status  . Never Smoker   Smokeless tobacco  . Never Used    History  Alcohol Use No    Family History  Problem Relation Age of Onset  . Colon cancer Mother 57  . Heart disease Mother   . Heart disease Father   . Heart attack Father   . Heart disease Maternal Grandmother     Review of Systems: Constitutional: no fever chills diaphoresis or fatigue or  change in weight.  Head and neck: no hearing loss, no epistaxis, no photophobia or visual disturbance. Respiratory: No cough, shortness of breath or wheezing. Cardiovascular: No chest pain peripheral edema, palpitations. Gastrointestinal: No abdominal distention, no abdominal pain, no change in bowel habits hematochezia or melena. Genitourinary: No dysuria, no frequency, no urgency, no nocturia. Musculoskeletal:No arthralgias, no back pain, no gait disturbance or myalgias. Neurological: No dizziness, no headaches, no numbness, no seizures, no syncope, no weakness, no tremors. Hematologic: No lymphadenopathy, no easy bruising. Psychiatric: No confusion, no hallucinations, no sleep disturbance.    Physical Exam: Filed Vitals:   09/14/12 1557  BP: 110/70  Pulse: 60   the general appearance reveals a well-developed well-nourished elderly woman in no distress.The head and neck exam reveals pupils equal and reactive.  Extraocular movements are full.  There is no scleral icterus.  The mouth and pharynx are normal.  The neck is supple.  The carotids reveal no bruits.  The jugular venous pressure is normal.  The  thyroid is not enlarged.  There is no lymphadenopathy.  The chest is clear to percussion and auscultation.  There are no rales or rhonchi.  Expansion of the chest is symmetrical.  The precordium is quiet.  The first heart sound is normal.  The second heart sound is physiologically split.  There is grade 2/6 systolic ejection murmur at the aortic area.  There is no abnormal lift or heave.  The abdomen is soft and nontender.  The bowel sounds are normal.  The liver and spleen are not enlarged.  There are no abdominal masses.  There are no abdominal bruits.  Extremities reveal good pedal pulses.  There is no phlebitis or edema.  There is no cyanosis or clubbing.  Strength is normal and symmetrical in all extremities.  There is no lateralizing weakness.  There are no sensory deficits.  The skin is warm  and dry.  There is no rash.  EKG shows atrial paced rhythm  Assessment / Plan: Continue present medication.  Await results of today's labs.  Recheck in 3 months for followup office visit and fasting labs

## 2012-09-14 NOTE — Assessment & Plan Note (Signed)
The patient has a past history of acute on chronic systolic congestive heart failure.  Clinically she is doing better on daily Lasix rather than alternate day Lasix.  We are checking electrolytes today

## 2012-09-15 ENCOUNTER — Telehealth: Payer: Self-pay | Admitting: *Deleted

## 2012-09-15 LAB — CBC WITH DIFFERENTIAL/PLATELET
Basophils Absolute: 0 10*3/uL (ref 0.0–0.1)
HCT: 39.1 % (ref 36.0–46.0)
Hemoglobin: 13 g/dL (ref 12.0–15.0)
Lymphs Abs: 1.9 10*3/uL (ref 0.7–4.0)
MCV: 90.1 fl (ref 78.0–100.0)
Monocytes Absolute: 0.7 10*3/uL (ref 0.1–1.0)
Monocytes Relative: 10.7 % (ref 3.0–12.0)
Neutro Abs: 3.8 10*3/uL (ref 1.4–7.7)
Platelets: 174 10*3/uL (ref 150.0–400.0)
RDW: 14.9 % — ABNORMAL HIGH (ref 11.5–14.6)

## 2012-09-15 LAB — BASIC METABOLIC PANEL
BUN: 37 mg/dL — ABNORMAL HIGH (ref 6–23)
CO2: 28 mEq/L (ref 19–32)
Chloride: 106 mEq/L (ref 96–112)
GFR: 34.5 mL/min — ABNORMAL LOW (ref >60.00)
Glucose, Bld: 96 mg/dL (ref 70–99)
Potassium: 3.6 mEq/L (ref 3.5–5.1)
Sodium: 141 mEq/L (ref 135–145)

## 2012-09-15 NOTE — Progress Notes (Signed)
Quick Note:  Please report to patient. The recent labs are stable. Continue same medication and careful diet. ______ 

## 2012-09-15 NOTE — Telephone Encounter (Signed)
Message copied by Burnell Blanks on Fri Sep 15, 2012  4:07 PM ------      Message from: Cassell Clement      Created: Fri Sep 15, 2012 12:42 PM       Please report to patient.  The recent labs are stable. Continue same medication and careful diet.

## 2012-09-15 NOTE — Telephone Encounter (Signed)
Advised patient of lab results  

## 2012-09-19 ENCOUNTER — Encounter: Payer: Self-pay | Admitting: Cardiology

## 2012-10-12 ENCOUNTER — Ambulatory Visit (INDEPENDENT_AMBULATORY_CARE_PROVIDER_SITE_OTHER): Payer: 59

## 2012-10-12 DIAGNOSIS — I4891 Unspecified atrial fibrillation: Secondary | ICD-10-CM

## 2012-10-30 ENCOUNTER — Ambulatory Visit (INDEPENDENT_AMBULATORY_CARE_PROVIDER_SITE_OTHER): Payer: 59 | Admitting: *Deleted

## 2012-10-30 DIAGNOSIS — Z950 Presence of cardiac pacemaker: Secondary | ICD-10-CM

## 2012-10-30 DIAGNOSIS — I495 Sick sinus syndrome: Secondary | ICD-10-CM

## 2012-10-31 ENCOUNTER — Other Ambulatory Visit: Payer: Self-pay | Admitting: Internal Medicine

## 2012-11-02 ENCOUNTER — Ambulatory Visit (INDEPENDENT_AMBULATORY_CARE_PROVIDER_SITE_OTHER): Payer: 59 | Admitting: *Deleted

## 2012-11-02 DIAGNOSIS — I4891 Unspecified atrial fibrillation: Secondary | ICD-10-CM

## 2012-11-07 LAB — REMOTE PACEMAKER DEVICE
ATRIAL PACING PM: 98
BAMS-0001: 175 {beats}/min
RV LEAD THRESHOLD: 0.75 V
VENTRICULAR PACING PM: 1

## 2012-11-17 ENCOUNTER — Encounter: Payer: Self-pay | Admitting: *Deleted

## 2012-11-20 ENCOUNTER — Encounter: Payer: Self-pay | Admitting: Internal Medicine

## 2012-11-28 ENCOUNTER — Encounter: Payer: Self-pay | Admitting: Internal Medicine

## 2012-12-07 ENCOUNTER — Ambulatory Visit (INDEPENDENT_AMBULATORY_CARE_PROVIDER_SITE_OTHER): Payer: 59 | Admitting: Cardiology

## 2012-12-07 ENCOUNTER — Ambulatory Visit (INDEPENDENT_AMBULATORY_CARE_PROVIDER_SITE_OTHER): Payer: 59 | Admitting: *Deleted

## 2012-12-07 ENCOUNTER — Other Ambulatory Visit (INDEPENDENT_AMBULATORY_CARE_PROVIDER_SITE_OTHER): Payer: 59

## 2012-12-07 ENCOUNTER — Encounter: Payer: Self-pay | Admitting: Cardiology

## 2012-12-07 VITALS — BP 112/64 | HR 75 | Ht 61.0 in | Wt 146.1 lb

## 2012-12-07 DIAGNOSIS — I350 Nonrheumatic aortic (valve) stenosis: Secondary | ICD-10-CM

## 2012-12-07 DIAGNOSIS — I4891 Unspecified atrial fibrillation: Secondary | ICD-10-CM

## 2012-12-07 DIAGNOSIS — E039 Hypothyroidism, unspecified: Secondary | ICD-10-CM

## 2012-12-07 DIAGNOSIS — I495 Sick sinus syndrome: Secondary | ICD-10-CM

## 2012-12-07 DIAGNOSIS — I480 Paroxysmal atrial fibrillation: Secondary | ICD-10-CM

## 2012-12-07 DIAGNOSIS — E7800 Pure hypercholesterolemia, unspecified: Secondary | ICD-10-CM

## 2012-12-07 DIAGNOSIS — I359 Nonrheumatic aortic valve disorder, unspecified: Secondary | ICD-10-CM

## 2012-12-07 LAB — CBC WITH DIFFERENTIAL/PLATELET
Basophils Absolute: 0 10*3/uL (ref 0.0–0.1)
HCT: 39.3 % (ref 36.0–46.0)
Lymphocytes Relative: 18.1 % (ref 12.0–46.0)
Lymphs Abs: 1.5 10*3/uL (ref 0.7–4.0)
Monocytes Relative: 7.8 % (ref 3.0–12.0)
Neutrophils Relative %: 70.8 % (ref 43.0–77.0)
Platelets: 198 10*3/uL (ref 150.0–400.0)
RDW: 14.6 % (ref 11.5–14.6)

## 2012-12-07 LAB — HEPATIC FUNCTION PANEL
ALT: 48 U/L — ABNORMAL HIGH (ref 0–35)
AST: 51 U/L — ABNORMAL HIGH (ref 0–37)
Alkaline Phosphatase: 121 U/L — ABNORMAL HIGH (ref 39–117)
Bilirubin, Direct: 0.2 mg/dL (ref 0.0–0.3)
Total Protein: 7.5 g/dL (ref 6.0–8.3)

## 2012-12-07 LAB — LIPID PANEL
Cholesterol: 148 mg/dL (ref 0–200)
Triglycerides: 168 mg/dL — ABNORMAL HIGH (ref 0.0–149.0)

## 2012-12-07 LAB — POCT INR: INR: 2.4

## 2012-12-07 LAB — TSH: TSH: 1.36 u[IU]/mL (ref 0.35–5.50)

## 2012-12-07 LAB — BASIC METABOLIC PANEL
BUN: 33 mg/dL — ABNORMAL HIGH (ref 6–23)
Calcium: 8.9 mg/dL (ref 8.4–10.5)
Creatinine, Ser: 1.4 mg/dL — ABNORMAL HIGH (ref 0.4–1.2)
GFR: 38.14 mL/min — ABNORMAL LOW (ref >60.00)

## 2012-12-07 NOTE — Patient Instructions (Addendum)
Will obtain labs today and call you with the results (lp/hfp/bmet)  Your physician recommends that you continue on your current medications as directed. Please refer to the Current Medication list given to you today.  Your physician wants you to follow-up in: 4 month ov/ekg You will receive a reminder letter in the mail two months in advance. If you don't receive a letter, please call our office to schedule the follow-up appointment.

## 2012-12-07 NOTE — Assessment & Plan Note (Signed)
She has not been aware of any racing of her heart or palpitations.  No dizziness or syncope.

## 2012-12-07 NOTE — Progress Notes (Signed)
[name] Date of Birth:  [birth year] [facility] [address] [place], [place]  [address] [phone]  Fax   [phone]  HPI: This pleasant 79 year old woman is seen for a post hospital followup office visit. She has a past history of tachybradycardia syndrome . She has a permanent pacemaker implanted 07/01/10 . She has a remote history of repair of a patent ductus arteriosus. She has a history of moderate valvular aortic stenosis. She has had a history of congestive heart failure. Most recent admission was 08/25/12-08/29/12 for exacerbation of CHF. Her to go into the hospital she had been on Lasix only 3 times a week and she was sent out on daily Lasix 40 mg daily.  Since last visit she has been doing well.   Current Outpatient Prescriptions  Medication Sig Dispense Refill  . albuterol (PROVENTIL HFA;VENTOLIN HFA) 108 (90 BASE) MCG/ACT inhaler Inhale 1 puff into the lungs every 6 (six) hours as needed for wheezing or shortness of breath.  1 Inhaler  3  . amiodarone (PACERONE) 200 MG tablet Take 200 mg by mouth every evening.       [name] amLODipine (NORVASC) 10 MG tablet Take 0.5 tablets (5 mg total) by mouth daily.      [name] atenolol (TENORMIN) 100 MG tablet Take 100 mg by mouth 2 (two) times daily.       [name] atorvastatin (LIPITOR) 40 MG tablet Take 20 mg by mouth See admin instructions. Takes 1 tablet Monday-Friday      . calcium-vitamin D (OSCAL WITH D) 500-200 MG-UNIT per tablet Take 1 tablet by mouth daily.      . cholecalciferol (VITAMIN D) 1000 UNITS tablet Take 5,000 Units by mouth every morning.      . famotidine (PEPCID) 10 MG tablet Take 10 mg by mouth daily as needed. For acid reducer      . furosemide (LASIX) 40 MG tablet Take 1 tablet (40 mg total) by mouth daily.  30 tablet  0  . levothyroxine (SYNTHROID, LEVOTHROID) 100 MCG tablet Take 50 mcg by mouth See admin instructions. Take 1/2 tab daily except for saturdays and sundays      .  losartan-hydrochlorothiazide (HYZAAR) 100-25 MG per tablet Take 1 tablet by mouth daily.       . Magnesium 250 MG TABS Take 1 tablet by mouth every Monday, Wednesday, and Friday.      . potassium chloride SA (K-DUR,KLOR-CON) 20 MEQ tablet Take 1 tablet (20 mEq total) by mouth 2 (two) times daily.  60 tablet  0  . pregabalin (LYRICA) 75 MG capsule Take 75 mg by mouth 2 (two) times daily.       . sertraline (ZOLOFT) 50 MG tablet Take 50 mg by mouth every evening.      . warfarin (COUMADIN) 2 MG tablet Take 1-2 mg by mouth daily. Take 2mg  every night except mon, wed, fri 1 mg       No current facility-administered medications for this visit.    Allergies  Allergen Reactions  . Codeine Nausea And Vomiting  . Zinc     nausea    Patient Active Problem List  Diagnosis  . RENAL CELL CANCER  . BENIGN NEOPLASM OTH&UNSPEC SITE DIGESTIVE SYSTEM  . HYPOTHYROIDISM  . HYPERLIPIDEMIA  . ANXIETY  . DEPRESSION  . RESTLESS LEG SYNDROME  . HYPERTENSION  . Gastroparesis  . RADIATION PROCTITIS  . FIBROCYSTIC BREAST DISEASE  . OSTEOMYELITIS  . CARCINOMA, UTERUS, HX OF  . RADIATION  THERAPY, HX OF  . Atrial fibrillation  . Tachycardia-bradycardia syndrome  . S/P repair of patent ductus arteriosus  . Osteoarthritis  . Aortic stenosis, moderate  . Family history of malignant neoplasm of gastrointestinal tract  . Benign neoplasm of stomach  . Diarrhea  . S/P right TKA  . Acute respiratory failure with hypoxia  . S/P total knee arthroplasty  . CHF, acute on chronic  . Chest pain  . SOB (shortness of breath)  . Subtherapeutic international normalized ratio (INR)  . Pacemaker  . Malaise and fatigue    History  Smoking status  . Never Smoker   Smokeless tobacco  . Never Used    History  Alcohol Use No    Family History  Problem Relation Age of Onset  . Colon cancer Mother 51  . Heart disease Mother   . Heart disease Father   . Heart attack Father   . Heart disease Maternal  Grandmother     Review of Systems: The patient denies any heat or cold intolerance.  No weight gain or weight loss.  The patient denies headaches or blurry vision.  There is no cough or sputum production.  The patient denies dizziness.  There is no hematuria or hematochezia.  The patient denies any muscle aches or arthritis.  The patient denies any rash.  The patient denies frequent falling or instability.  There is no history of depression or anxiety.  All other systems were reviewed and are negative.   Physical Exam: Filed Vitals:   12/07/12 0811  BP: 112/64  Pulse: 75   the general appearance reveals a healthy-appearing elderly woman in no distress.The head and neck exam reveals pupils equal and reactive.  Extraocular movements are full.  There is no scleral icterus.  The mouth and pharynx are normal.  The neck is supple.  The carotids reveal no bruits.  The jugular venous pressure is normal.  The  thyroid is not enlarged.  There is no lymphadenopathy.  The chest is clear to percussion and auscultation.  There are no rales or rhonchi.  Expansion of the chest is symmetrical.  The precordium is quiet.  The first heart sound is normal.  The second heart sound is physiologically split.  There is no  gallop rub or click.  There is a soft systolic ejection murmur across the aortic valve.  No diastolic murmur. There is no abnormal lift or heave.  The abdomen is soft and nontender.  The bowel sounds are normal.  The liver and spleen are not enlarged.  There are no abdominal masses.  There are no abdominal bruits.  Extremities reveal good pedal pulses.  There is no phlebitis or edema.  There is no cyanosis or clubbing.  Strength is normal and symmetrical in all extremities.  There is no lateralizing weakness.  There are no sensory deficits.  The skin is warm and dry.  There is no rash.      Assessment / Plan: Continue on same medication.  Lab work today pending.  Recheck in 4 months for office visit and  EKG

## 2012-12-07 NOTE — Assessment & Plan Note (Signed)
Echocardiogram on 08/26/12 showed ejection fraction of 60-65% with mild to moderate aortic stenosis.  Her peak gradient was 29 and a mean gradient was 14.  The patient denies any symptoms of aortic stenosis such as exertional angina, CHF, or exertional dizziness or syncope.  Her exercise tolerance is good and she continues to work full-time

## 2012-12-08 NOTE — Progress Notes (Signed)
Quick Note:  Please report to patient. The recent labs are stable. Continue same medication and careful diet. The thyroid function is normal. CBC shows no anemia. The lipids are stable. 3 of the liver function studies are slightly elevated so I want her to reduce the dose of amiodarone down to 100 mg each day. ______

## 2012-12-11 ENCOUNTER — Telehealth: Payer: Self-pay | Admitting: *Deleted

## 2012-12-11 NOTE — Telephone Encounter (Signed)
Message copied by Burnell Blanks on Mon Dec 11, 2012  5:40 PM ------      Message from: Cassell Clement      Created: Fri Dec 08, 2012  9:42 AM       Please report to patient.  The recent labs are stable. Continue same medication and careful diet.  The thyroid function is normal.  CBC shows no anemia.  The lipids are stable.  3 of the liver function studies are slightly elevated so I want her to reduce the dose of amiodarone down to 100 mg each day. ------

## 2012-12-11 NOTE — Telephone Encounter (Signed)
Advised patient of lab results and medication change  

## 2012-12-20 ENCOUNTER — Encounter: Payer: 59 | Admitting: Internal Medicine

## 2013-01-08 ENCOUNTER — Ambulatory Visit (INDEPENDENT_AMBULATORY_CARE_PROVIDER_SITE_OTHER): Payer: 59

## 2013-01-08 DIAGNOSIS — I4891 Unspecified atrial fibrillation: Secondary | ICD-10-CM

## 2013-01-08 LAB — POCT INR: INR: 2.9

## 2013-01-19 ENCOUNTER — Ambulatory Visit (INDEPENDENT_AMBULATORY_CARE_PROVIDER_SITE_OTHER): Payer: 59 | Admitting: Internal Medicine

## 2013-01-19 ENCOUNTER — Encounter: Payer: Self-pay | Admitting: Internal Medicine

## 2013-01-19 VITALS — BP 149/76 | HR 77 | Ht 61.0 in | Wt 145.4 lb

## 2013-01-19 DIAGNOSIS — I495 Sick sinus syndrome: Secondary | ICD-10-CM

## 2013-01-19 DIAGNOSIS — I4891 Unspecified atrial fibrillation: Secondary | ICD-10-CM

## 2013-01-19 LAB — PACEMAKER DEVICE OBSERVATION
AL AMPLITUDE: 2 mv
BAMS-0001: 175 {beats}/min
BATTERY VOLTAGE: 2.8 V
RV LEAD AMPLITUDE: 8 mv

## 2013-01-19 NOTE — Patient Instructions (Addendum)
Your physician wants you to follow-up in: one year with Dr. Johney Frame.  You will receive a reminder letter in the mail two months in advance. If you don't receive a letter, please call our office to schedule the follow-up appointment.  Remote monitoring is used to monitor your Pacemaker of ICD from home. This monitoring reduces the number of office visits required to check your device to one time per year. It allows [place] to keep an eye on the functioning of your device to ensure it is working properly. You are scheduled for a device check from home on April 23, 2013. You may send your transmission at any time that day. If you have a wireless device, the transmission will be sent automatically. After your physician reviews your transmission, you will receive a postcard with your next transmission date.   Your physician recommends that you continue on your current medications as directed. Please refer to the Current Medication list given to you today.

## 2013-01-19 NOTE — Progress Notes (Signed)
Primary Cardiologist:  Dr Patty Sermons  Ms Elsasser has a h/o atrial fibrillation and tachycardia/ bradycardia syndrome sp PPM (MDT) by Dr Deborah Chalk.  She presents today for follow-up in the Electrophysiology device clinic.   She continues to work for old [employer].  Today, she  denies symptoms of palpitations, chest pain, shortness of breath, orthopnea, PND, lower extremity edema, dizziness, presyncope, syncope, or neurologic sequela.  The patientis tolerating medications without difficulties and is otherwise without complaint today.   Past Medical History  Diagnosis Date  . PAF (paroxysmal atrial fibrillation)     controlled with amiodarone, on coumadin  . CAD (coronary artery disease)   . Aortic root dilatation   . HTN (hypertension)   . Hyperlipemia   . Hypothyroidism   . Moderate aortic stenosis   . Chronic anticoagulation   . Tachycardia-bradycardia syndrome     s/p PPM by Dr Deborah Chalk (MDT) 07/03/10  . GERD (gastroesophageal reflux disease)   . Chronic kidney disease   . Pacemaker     2012  . Arthritis   . History of nephrectomy     left - due to kidney cancer  . Neuropathy     feet/legs  . Restless leg syndrome   . Headache     hx migraines - takes Zoloft for migraines  . History of uterine cancer   . Cancer     hx Kidney cancer / hx of endometrial cancer  . PONV (postoperative nausea and vomiting)   . Anxiety   . Shortness of breath   . Heart murmur     Past Surgical History  Procedure Laterality Date  . Cardiac catheterization  2008  . Nephrectomy    . Total abdominal hysterectomy    . Patent ductus arterious repair  age 58  . Ptvp    . Pacemaker insertion  07/04/11    by Dr Deborah Chalk  . Cholecystectomy    . Upper gastrointestinal endoscopy    . Joint replacement      rt total hip 2008  . Total knee arthroplasty  02/07/2012    Procedure: TOTAL KNEE ARTHROPLASTY;  Surgeon: Shelda Pal, MD;  Location: [facility] ORS;  Service: Orthopedics;  Laterality: Right;   . Insert / replace / remove pacemaker      History   Social History  . Marital Status: Married    Spouse Name: N/A    Number of Children: N/A  . Years of Education: N/A   Occupational History  . Not on file.   Social History Main Topics  . Smoking status: Never Smoker   . Smokeless tobacco: Never Used  . Alcohol Use: No  . Drug Use: No  . Sexually Active: No   Other Topics Concern  . Not on file   Social History Narrative   Lives in [place].  Continues to work.    Family History  Problem Relation Age of Onset  . Colon cancer Mother 44  . Heart disease Mother   . Heart disease Father   . Heart attack Father   . Heart disease Maternal Grandmother     Allergies  Allergen Reactions  . Codeine Nausea And Vomiting  . Zinc     nausea    Current outpatient prescriptions:albuterol (PROVENTIL HFA;VENTOLIN HFA) 108 (90 BASE) MCG/ACT inhaler, Inhale 1 puff into the lungs every 6 (six) hours as needed for wheezing or shortness of breath., Disp: 1 Inhaler, Rfl: 3;  amiodarone (PACERONE) 200 MG tablet, Take 200 mg by  mouth as directed. 1/2 tablet daily, Disp: , Rfl: ;  amLODipine (NORVASC) 10 MG tablet, Take 0.5 tablets (5 mg total) by mouth daily., Disp: , Rfl:  atenolol (TENORMIN) 100 MG tablet, Take 100 mg by mouth 2 (two) times daily. , Disp: , Rfl: ;  atorvastatin (LIPITOR) 40 MG tablet, Take 20 mg by mouth See admin instructions. Takes 1 tablet Monday-Friday, Disp: , Rfl: ;  calcium-vitamin D (OSCAL WITH D) 500-200 MG-UNIT per tablet, Take 1 tablet by mouth daily., Disp: , Rfl: ;  cholecalciferol (VITAMIN D) 1000 UNITS tablet, Take 5,000 Units by mouth every morning., Disp: , Rfl:  famotidine (PEPCID) 10 MG tablet, Take 10 mg by mouth daily as needed. For acid reducer, Disp: , Rfl: ;  furosemide (LASIX) 40 MG tablet, Take 1 tablet (40 mg total) by mouth daily., Disp: 30 tablet, Rfl: 0;  levothyroxine (SYNTHROID, LEVOTHROID) 100 MCG tablet, Take 50 mcg by mouth See admin  instructions. Take 1/2 tab daily except for saturdays and sundays, Disp: , Rfl:  losartan-hydrochlorothiazide (HYZAAR) 100-25 MG per tablet, Take 1 tablet by mouth daily. , Disp: , Rfl: ;  Magnesium 250 MG TABS, Take 1 tablet by mouth every Monday, Wednesday, and Friday., Disp: , Rfl: ;  potassium chloride SA (K-DUR,KLOR-CON) 20 MEQ tablet, Take 1 tablet (20 mEq total) by mouth 2 (two) times daily., Disp: 60 tablet, Rfl: 0;  pregabalin (LYRICA) 75 MG capsule, Take 75 mg by mouth 2 (two) times daily. , Disp: , Rfl:  sertraline (ZOLOFT) 50 MG tablet, Take 50 mg by mouth every evening., Disp: , Rfl: ;  warfarin (COUMADIN) 2 MG tablet, Take 1-2 mg by mouth daily. Take 2mg  every night except mon, wed, fri 1 mg, Disp: , Rfl:   Physical Exam: Filed Vitals:   01/19/13 0859  BP: 149/76  Pulse: 77  Height: 5\' 1"  (1.549 m)  Weight: 145 lb 6.4 oz (65.953 kg)    GEN- The patient is well appearing, alert and oriented x 3 today.   Head- normocephalic, atraumatic Eyes-  Sclera clear, conjunctiva pink Ears- hearing intact Oropharynx- clear Neck- supple, no JVP Lymph- no cervical lymphadenopathy Lungs- Clear to ausculation bilaterally, normal work of breathing Chest- R sided pacemaker pocket is well healed Heart- Regular rate and rhythm, no murmurs, rubs or gallops, PMI not laterally displaced GI- soft, NT, ND, + BS Extremities- no clubbing, cyanosis, or edema  Device interrogation is reviewed and normal today  A/P 1. Tachy/brady syndrome Normal pacemaker function See Pace Art report No changes today  2. Afib Stable No change required today  Return in 1 year carelink every 3 months

## 2013-02-01 ENCOUNTER — Encounter: Payer: Self-pay | Admitting: Cardiology

## 2013-02-05 ENCOUNTER — Ambulatory Visit (INDEPENDENT_AMBULATORY_CARE_PROVIDER_SITE_OTHER): Payer: 59 | Admitting: *Deleted

## 2013-02-05 DIAGNOSIS — I4891 Unspecified atrial fibrillation: Secondary | ICD-10-CM

## 2013-03-19 ENCOUNTER — Ambulatory Visit (INDEPENDENT_AMBULATORY_CARE_PROVIDER_SITE_OTHER): Payer: 59 | Admitting: *Deleted

## 2013-03-19 DIAGNOSIS — I4891 Unspecified atrial fibrillation: Secondary | ICD-10-CM

## 2013-04-02 ENCOUNTER — Ambulatory Visit (INDEPENDENT_AMBULATORY_CARE_PROVIDER_SITE_OTHER): Payer: 59 | Admitting: *Deleted

## 2013-04-02 DIAGNOSIS — I4891 Unspecified atrial fibrillation: Secondary | ICD-10-CM

## 2013-04-02 LAB — POCT INR: INR: 2.4

## 2013-04-05 ENCOUNTER — Encounter: Payer: Self-pay | Admitting: Cardiology

## 2013-04-06 ENCOUNTER — Encounter: Payer: Self-pay | Admitting: Cardiology

## 2013-04-09 ENCOUNTER — Ambulatory Visit (INDEPENDENT_AMBULATORY_CARE_PROVIDER_SITE_OTHER): Payer: 59 | Admitting: Cardiology

## 2013-04-09 ENCOUNTER — Encounter: Payer: Self-pay | Admitting: Cardiology

## 2013-04-09 VITALS — BP 132/74 | HR 74 | Ht 61.0 in | Wt 147.1 lb

## 2013-04-09 DIAGNOSIS — I350 Nonrheumatic aortic (valve) stenosis: Secondary | ICD-10-CM

## 2013-04-09 DIAGNOSIS — I4891 Unspecified atrial fibrillation: Secondary | ICD-10-CM

## 2013-04-09 DIAGNOSIS — I480 Paroxysmal atrial fibrillation: Secondary | ICD-10-CM

## 2013-04-09 DIAGNOSIS — I10 Essential (primary) hypertension: Secondary | ICD-10-CM

## 2013-04-09 DIAGNOSIS — Z950 Presence of cardiac pacemaker: Secondary | ICD-10-CM

## 2013-04-09 DIAGNOSIS — I359 Nonrheumatic aortic valve disorder, unspecified: Secondary | ICD-10-CM

## 2013-04-09 DIAGNOSIS — I119 Hypertensive heart disease without heart failure: Secondary | ICD-10-CM

## 2013-04-09 NOTE — Assessment & Plan Note (Signed)
Patient has a functioning dual-chamber pacemaker.  EKG today shows atrial paced rhythm with the patient's own QRS.

## 2013-04-09 NOTE — Assessment & Plan Note (Signed)
Her last echocardiogram on 08/26/12 showed an ejection fraction of 60-65% with grade 2 diastolic dysfunction and mild to moderate aortic stenosis.  She is not having chest pain, exertional dizziness or syncope, or symptoms of CHF.

## 2013-04-09 NOTE — Assessment & Plan Note (Signed)
Blood pressure is remaining stable on current therapy.  She does have some pedal edema referable to her amlodipine.

## 2013-04-09 NOTE — Patient Instructions (Addendum)
Your physician wants you to follow-up in: 4 months You will receive a reminder letter in the mail two months in advance. If you don't receive a letter, please call our office to schedule the follow-up appointment.     .Your physician recommends that you continue on your current medications as directed. Please refer to the Current Medication list given to you today.  

## 2013-04-09 NOTE — Progress Notes (Signed)
[name] Date of Birth:  07/08/1934 [facility] [address] [place], [place]  [address] [phone]         Fax   [phone]  History of Present Illness: This pleasant 79 year old woman is seen for a scheduled followup office visit. She has a past history of tachybradycardia syndrome . She has a permanent pacemaker implanted 07/01/10 . She has a remote history of repair of a patent ductus arteriosus. She has a history of moderate valvular aortic stenosis. She has had a history of congestive heart failure. Most recent admission was 08/25/12-08/29/12 for exacerbation of CHF. Her to go into the hospital she had been on Lasix only 3 times a week and she was sent out on daily Lasix 40 mg daily. Since last visit she has been doing well.   Current Outpatient Prescriptions  Medication Sig Dispense Refill  . amiodarone (PACERONE) 200 MG tablet Take 200 mg by mouth as directed. 1/2 tablet daily      . amLODipine (NORVASC) 10 MG tablet Take 0.5 tablets (5 mg total) by mouth daily.      [name] atenolol (TENORMIN) 100 MG tablet Take 100 mg by mouth 2 (two) times daily.       [name] atorvastatin (LIPITOR) 40 MG tablet Take 20 mg by mouth See admin instructions. Takes 1 tablet Monday-Friday      . calcium-vitamin D (OSCAL WITH D) 500-200 MG-UNIT per tablet Take 1 tablet by mouth daily.      . cholecalciferol (VITAMIN D) 1000 UNITS tablet Take 5,000 Units by mouth every morning.      . famotidine (PEPCID) 10 MG tablet Take 10 mg by mouth daily as needed. For acid reducer      . furosemide (LASIX) 40 MG tablet Take 1 tablet (40 mg total) by mouth daily.  30 tablet  0  . levothyroxine (SYNTHROID, LEVOTHROID) 100 MCG tablet Take 50 mcg by mouth See admin instructions. Take 1/2 tab daily except for saturdays and sundays      . losartan-hydrochlorothiazide (HYZAAR) 100-25 MG per tablet Take 1 tablet by mouth daily.       . Magnesium 250 MG TABS Take 1 tablet by mouth every Monday,  Wednesday, and Friday.      . potassium chloride SA (K-DUR,KLOR-CON) 20 MEQ tablet Take 1 tablet (20 mEq total) by mouth 2 (two) times daily.  60 tablet  0  . pregabalin (LYRICA) 75 MG capsule Take 75 mg by mouth 2 (two) times daily.       . sertraline (ZOLOFT) 50 MG tablet Take 50 mg by mouth every evening.      . warfarin (COUMADIN) 2 MG tablet Take 1-2 mg by mouth daily. Take 2mg  every night except mon, wed, fri 1 mg      . albuterol (PROVENTIL HFA;VENTOLIN HFA) 108 (90 BASE) MCG/ACT inhaler Inhale 1 puff into the lungs every 6 (six) hours as needed for wheezing or shortness of breath.  1 Inhaler  3   No current facility-administered medications for this visit.    Allergies  Allergen Reactions  . Codeine Nausea And Vomiting  . Zinc     nausea    Patient Active Problem List   Diagnosis Date Noted  . Atrial fibrillation 12/02/2010    Priority: High  . HYPERTENSION 11/17/2007    Priority: Medium  . Malaise and fatigue 09/14/2012  . Chest pain 08/26/2012  . SOB (shortness of breath) 08/26/2012  . Subtherapeutic international normalized ratio (INR)  08/26/2012  . Pacemaker 08/26/2012  . CHF, acute on chronic 02/11/2012  . Acute respiratory failure with hypoxia 02/10/2012  . S/P total knee arthroplasty 02/10/2012  . S/P right TKA 02/07/2012  . Family history of malignant neoplasm of gastrointestinal tract 07/27/2011  . Benign neoplasm of stomach 07/27/2011  . Diarrhea 07/27/2011  . Osteoarthritis 07/09/2011  . Aortic stenosis, moderate 07/09/2011  . S/P repair of patent ductus arteriosus 04/22/2011  . Tachycardia-bradycardia syndrome 12/24/2010  . RENAL CELL CANCER 11/17/2007  . HYPOTHYROIDISM 11/17/2007  . HYPERLIPIDEMIA 11/17/2007  . ANXIETY 11/17/2007  . DEPRESSION 11/17/2007  . RESTLESS LEG SYNDROME 11/17/2007  . Gastroparesis 11/17/2007  . RADIATION PROCTITIS 11/17/2007  . FIBROCYSTIC BREAST DISEASE 11/17/2007  . OSTEOMYELITIS 11/17/2007  . CARCINOMA, UTERUS, HX OF  11/17/2007  . RADIATION THERAPY, HX OF 11/17/2007  . BENIGN NEOPLASM OTH&UNSPEC SITE DIGESTIVE SYSTEM 01/11/2007    History  Smoking status  . Never Smoker   Smokeless tobacco  . Never Used    History  Alcohol Use No    Family History  Problem Relation Age of Onset  . Colon cancer Mother 60  . Heart disease Mother   . Heart disease Father   . Heart attack Father   . Heart disease Maternal Grandmother     Review of Systems: Constitutional: no fever chills diaphoresis or fatigue or change in weight.  Head and neck: no hearing loss, no epistaxis, no photophobia or visual disturbance. Respiratory: No cough, shortness of breath or wheezing. Cardiovascular: No chest pain peripheral edema, palpitations. Gastrointestinal: No abdominal distention, no abdominal pain, no change in bowel habits hematochezia or melena. Genitourinary: No dysuria, no frequency, no urgency, no nocturia. Musculoskeletal:No arthralgias, no back pain, no gait disturbance or myalgias. Neurological: No dizziness, no headaches, no numbness, no seizures, no syncope, no weakness, no tremors. Hematologic: No lymphadenopathy, no easy bruising. Psychiatric: No confusion, no hallucinations, no sleep disturbance.    Physical Exam: Filed Vitals:   04/09/13 0910  BP: 132/74  Pulse: 74   the general appearance reveals a well-developed elderly woman in no distress.  She is alert.  She still works full time.The head and neck exam reveals pupils equal and reactive.  Extraocular movements are full.  There is no scleral icterus.  The mouth and pharynx are normal.  The neck is supple.  The carotids reveal no bruits.  The jugular venous pressure is normal.  The  thyroid is not enlarged.  There is no lymphadenopathy.  The chest is clear to percussion and auscultation.  There are no rales or rhonchi.  Expansion of the chest is symmetrical.  The precordium is quiet.  The first heart sound is normal.  The second heart sound is  physiologically split.  There is grade 2/6 systolic ejection murmur at the base.  No diastolic murmur.  There is no abnormal lift or heave.  The abdomen is soft and nontender.  The bowel sounds are normal.  The liver and spleen are not enlarged.  There are no abdominal masses.  There are no abdominal bruits.  Extremities reveal weak pedal pulses.  There is no phlebitis or edema.  There is no cyanosis or clubbing.  Strength is normal and symmetrical in all extremities.  There is no lateralizing weakness.  There are no sensory deficits.  The skin is warm and dry.  There is no rash.  EKG shows atrial paced rhythm   Assessment / Plan:  Continue on same medication.  Recheck in 4 months for  office visit.

## 2013-04-09 NOTE — Assessment & Plan Note (Signed)
The patient is maintaining normal sinus rhythm on amiodarone.  She remains on long-term Coumadin

## 2013-04-16 ENCOUNTER — Ambulatory Visit (INDEPENDENT_AMBULATORY_CARE_PROVIDER_SITE_OTHER): Payer: 59 | Admitting: Gastroenterology

## 2013-04-16 ENCOUNTER — Telehealth: Payer: Self-pay | Admitting: Gastroenterology

## 2013-04-16 ENCOUNTER — Encounter: Payer: Self-pay | Admitting: Gastroenterology

## 2013-04-16 VITALS — BP 132/68 | HR 68 | Ht 61.0 in | Wt 144.2 lb

## 2013-04-16 DIAGNOSIS — K529 Noninfective gastroenteritis and colitis, unspecified: Secondary | ICD-10-CM

## 2013-04-16 DIAGNOSIS — R109 Unspecified abdominal pain: Secondary | ICD-10-CM

## 2013-04-16 DIAGNOSIS — R197 Diarrhea, unspecified: Secondary | ICD-10-CM

## 2013-04-16 MED ORDER — CHOLESTYRAMINE 4 G PO PACK: 1.0000 | PACK | Freq: Every day | ORAL | Status: DC

## 2013-04-16 MED ORDER — HYOSCYAMINE SULFATE ER 0.375 MG PO TBCR: EXTENDED_RELEASE_TABLET | ORAL | Status: DC

## 2013-04-16 NOTE — Patient Instructions (Addendum)
We have sent the following medications to your pharmacy for you to pick up at your convenience: Hyoscyamine Questran  Please follow up with Dr Arlyce Dice on Tuesday 05/22/13 @ 9:15 am.  CC: Dr Oneta Rack, Dr Melvia Heaps

## 2013-04-16 NOTE — Telephone Encounter (Signed)
Pt states she has been having right sided abdominal pain and has noticed a change in her bowel habits. States she is having urgency and diarrhea. Pt requesting to be seen. Pt scheduled to see Doug Sou PA today at 1:30pm. Pt aware of appt date and time.

## 2013-04-16 NOTE — Progress Notes (Signed)
04/16/2013 [name] [id number] 19-Jun-1934   History of Present Illness: [name] is a very nice 79 year old white female known to Dr. Arlyce Dice who has multiple medical problems and had a colonoscopy in 2012.  She was found to have left colon diverticulosis, no polyps. She has history of chronic atrial fibrillation, is on Coumadin. She also has tachybradycardia syndrome status post pacemaker placement, has history of coronary artery disease and has a personal history of uterine cancer for which she underwent a hysterectomy and then radiation therapy approximately 10 years ago. Subsequent to that she was also found to have a renal cell Ca, and underwent left nephrectomy. Patient states that ever since she had the abdominal radiation she has had chronic diarrhea and usually has 5-6 loose bowel movements per day.  She comes in today complaining of the same issues with diarrhea approximately 6 times per day with urgency and incontinence at times.  She also has some right sided abdominal discomfort, which has been present for a long time as well.  She says that she was given medication by Dr. Arlyce Dice for abdominal cramping, which does help, but she only uses it on rare occasion (she just used her last one, however).  Has some nausea, but no vomiting.  No fevers or chills.  CT scan of the abdomen and pelvis with contrast in 07/2012 was unremarkable for any causes of her pain.    Current Medications, Allergies, Past Medical History, Past Surgical History, Family History and Social History were reviewed in [employer] record.   Physical Exam: BP 132/68  Pulse 68  Ht 5\' 1"  (1.549 m)  Wt 144 lb 3.2 oz (65.409 kg)  BMI 27.26 kg/m2 General: Well developed, white female in no acute distress Head: Normocephalic and atraumatic Eyes:  sclerae anicteric, conjunctiva pink  Ears: Normal auditory acuity Lungs: Clear throughout to auscultation Heart: Regular rate and rhythm Abdomen:  Soft, non-distended.  BS present.  Multiple scars noted on abdomen.  No hernias noted.  Mild to moderate TTP on right side of the abdomen but exam is benign. Musculoskeletal: Symmetrical with no gross deformities  Extremities: No edema  Neurological: Alert oriented x 4, grossly nonfocal Psychological:  Alert and cooperative. Normal mood and affect  Assessment and Recommendations: #63  79 year old female with chronic diarrhea and right sided abdominal pain:  Likely secondary to abdominal radiation that she had in the past.  Diarrhea may also be component of IBS and/or bile salt related.  Had multiple abdominal surgeries and may have scar tissue/adhesive disease.  Will refill the anti-spasmodic that she was given previously and she can continue that prn.  Will also start questran once daily to see if that helps with the diarrhea. #2 chronic anticoagulation with Coumadin #3 atrial fibrillation #4 coronary artery disease #5 tachybradycardia syndrome status post pacemaker #6 history of uterine cancer with hysterectomy and radiation #7 history of renal cell CA status post left nephrectomy #8 status post cholecystectomy  **Follow-up in approximately four weeks.

## 2013-04-17 NOTE — Progress Notes (Signed)
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 May take Questran one to 4 packets daily as needed for diarrhea

## 2013-04-23 ENCOUNTER — Ambulatory Visit (INDEPENDENT_AMBULATORY_CARE_PROVIDER_SITE_OTHER): Payer: 59 | Admitting: *Deleted

## 2013-04-23 DIAGNOSIS — Z950 Presence of cardiac pacemaker: Secondary | ICD-10-CM

## 2013-04-23 DIAGNOSIS — I495 Sick sinus syndrome: Secondary | ICD-10-CM

## 2013-04-26 ENCOUNTER — Ambulatory Visit (INDEPENDENT_AMBULATORY_CARE_PROVIDER_SITE_OTHER): Payer: 59 | Admitting: *Deleted

## 2013-04-26 DIAGNOSIS — I4891 Unspecified atrial fibrillation: Secondary | ICD-10-CM

## 2013-04-26 LAB — POCT INR: INR: 2.8

## 2013-05-04 LAB — REMOTE PACEMAKER DEVICE
AL IMPEDENCE PM: 491 Ohm
ATRIAL PACING PM: 100
RV LEAD THRESHOLD: 0.75 V

## 2013-05-08 ENCOUNTER — Encounter: Payer: Self-pay | Admitting: *Deleted

## 2013-05-22 ENCOUNTER — Ambulatory Visit: Payer: 59 | Admitting: Gastroenterology

## 2013-05-24 ENCOUNTER — Ambulatory Visit (INDEPENDENT_AMBULATORY_CARE_PROVIDER_SITE_OTHER): Payer: 59 | Admitting: *Deleted

## 2013-05-24 DIAGNOSIS — I4891 Unspecified atrial fibrillation: Secondary | ICD-10-CM

## 2013-06-12 ENCOUNTER — Encounter: Payer: Self-pay | Admitting: Internal Medicine

## 2013-06-20 ENCOUNTER — Ambulatory Visit: Payer: 59 | Admitting: Gastroenterology

## 2013-06-26 ENCOUNTER — Encounter: Payer: Self-pay | Admitting: Gastroenterology

## 2013-06-26 ENCOUNTER — Ambulatory Visit (INDEPENDENT_AMBULATORY_CARE_PROVIDER_SITE_OTHER): Payer: 59 | Admitting: Gastroenterology

## 2013-06-26 VITALS — BP 120/70 | HR 72 | Ht 61.0 in | Wt 146.4 lb

## 2013-06-26 DIAGNOSIS — R197 Diarrhea, unspecified: Secondary | ICD-10-CM

## 2013-06-26 MED ORDER — RIFAXIMIN 200 MG PO TABS
200.0000 mg | ORAL_TABLET | Freq: Three times a day (TID) | ORAL | Status: DC
Start: 2013-06-26 — End: 2013-08-10

## 2013-06-26 NOTE — Patient Instructions (Signed)
Your medication is being sent your pharmacy  Follow up in 3 weeks

## 2013-06-26 NOTE — Assessment & Plan Note (Signed)
While diarrhea could be a variant of severe IBS, radiation enteritis remains a concern.  She could have bacterial overgrowth.  Before embarking further on stool studies I think it is worthwhile giving her a short course of antibiotics.  Because she is on Coumadin nonabsorbable antibiotics are preferred.  She will take one week of xifixan

## 2013-06-26 NOTE — Progress Notes (Signed)
History of Present Illness:  The patient continues to have frequent diarrhea almost daily.  She did not tolerate cholestyramine whereby it produced gas and distention.  She occasionally will go a day without a bowel movement and then complain of abdominal discomfort.    Review of Systems: Pertinent positive and negative review of systems were noted in the above HPI section. All other review of systems were otherwise negative.    Current Medications, Allergies, Past Medical History, Past Surgical History, Family History and Social History were reviewed in [employer] electronic medical record  Vital signs were reviewed in today's medical record. Physical Exam: General: Well developed , well nourished, no acute distress

## 2013-06-27 ENCOUNTER — Telehealth: Payer: Self-pay

## 2013-06-27 NOTE — Telephone Encounter (Signed)
Pts appt moved to 07/18/13@3 :30pm. Pt aware.

## 2013-06-27 NOTE — Telephone Encounter (Signed)
Message copied by Chrystie Nose on Wed Jun 27, 2013  2:22 PM ------      Message from: Melvia Heaps D      Created: Tue Jun 26, 2013  4:49 PM      Regarding: RE: OV       Midlevel is OK      ----- Message -----         From: Lily Lovings, RN         Sent: 06/26/2013   4:19 PM           To: Louis Meckel, MD      Subject: OV                                                       Pt was supposed to follow-up in 3 weeks per her visit with you today. 1st available the 1st part of December. Is this ok or do you want them scheduled with a midlevel?       ------

## 2013-07-17 ENCOUNTER — Encounter: Payer: Self-pay | Admitting: Internal Medicine

## 2013-07-18 ENCOUNTER — Ambulatory Visit: Payer: Medicare Other | Admitting: Emergency Medicine

## 2013-07-18 ENCOUNTER — Ambulatory Visit (INDEPENDENT_AMBULATORY_CARE_PROVIDER_SITE_OTHER): Payer: 59 | Admitting: Gastroenterology

## 2013-07-18 ENCOUNTER — Encounter: Payer: Self-pay | Admitting: Emergency Medicine

## 2013-07-18 ENCOUNTER — Encounter: Payer: Self-pay | Admitting: Gastroenterology

## 2013-07-18 VITALS — BP 118/70 | HR 72 | Temp 97.8°F | Resp 16 | Ht 60.5 in | Wt 148.0 lb

## 2013-07-18 VITALS — BP 130/60 | HR 80 | Ht 61.0 in | Wt 149.2 lb

## 2013-07-18 DIAGNOSIS — R197 Diarrhea, unspecified: Secondary | ICD-10-CM

## 2013-07-18 DIAGNOSIS — E559 Vitamin D deficiency, unspecified: Secondary | ICD-10-CM

## 2013-07-18 DIAGNOSIS — R7309 Other abnormal glucose: Secondary | ICD-10-CM

## 2013-07-18 DIAGNOSIS — E782 Mixed hyperlipidemia: Secondary | ICD-10-CM | POA: Insufficient documentation

## 2013-07-18 DIAGNOSIS — I10 Essential (primary) hypertension: Secondary | ICD-10-CM

## 2013-07-18 DIAGNOSIS — I119 Hypertensive heart disease without heart failure: Secondary | ICD-10-CM | POA: Insufficient documentation

## 2013-07-18 NOTE — Patient Instructions (Signed)
Fat and Cholesterol Control Diet Fat and cholesterol levels in your blood and organs are influenced by your diet. High levels of fat and cholesterol may lead to diseases of the heart, small and large blood vessels, gallbladder, liver, and pancreas. CONTROLLING FAT AND CHOLESTEROL WITH DIET Although exercise and lifestyle factors are important, your diet is key. That is because certain foods are known to raise cholesterol and others to lower it. The goal is to balance foods for their effect on cholesterol and more importantly, to replace saturated and trans fat with other types of fat, such as monounsaturated fat, polyunsaturated fat, and omega-3 fatty acids. On average, a person should consume no more than 15 to 17 g of saturated fat daily. Saturated and trans fats are considered "bad" fats, and they will raise LDL cholesterol. Saturated fats are primarily found in animal products such as meats, butter, and cream. However, that does not mean you need to give up all your favorite foods. Today, there are good tasting, low-fat, low-cholesterol substitutes for most of the things you like to eat. Choose low-fat or nonfat alternatives. Choose round or loin cuts of red meat. These types of cuts are lowest in fat and cholesterol. Chicken (without the skin), fish, veal, and ground turkey breast are great choices. Eliminate fatty meats, such as hot dogs and salami. Even shellfish have little or no saturated fat. Have a 3 oz (85 g) portion when you eat lean meat, poultry, or fish. Trans fats are also called "partially hydrogenated oils." They are oils that have been scientifically manipulated so that they are solid at room temperature resulting in a longer shelf life and improved taste and texture of foods in which they are added. Trans fats are found in stick margarine, some tub margarines, cookies, crackers, and baked goods.  When baking and cooking, oils are a great substitute for butter. The monounsaturated oils  are especially beneficial since it is believed they lower LDL and raise HDL. The oils you should avoid entirely are saturated tropical oils, such as coconut and palm.  Remember to eat a lot from food groups that are naturally free of saturated and trans fat, including fish, fruit, vegetables, beans, grains (barley, rice, couscous, bulgur wheat), and pasta (without cream sauces).  IDENTIFYING FOODS THAT LOWER FAT AND CHOLESTEROL  Soluble fiber may lower your cholesterol. This type of fiber is found in fruits such as apples, vegetables such as broccoli, potatoes, and carrots, legumes such as beans, peas, and lentils, and grains such as barley. Foods fortified with plant sterols (phytosterol) may also lower cholesterol. You should eat at least 2 g per day of these foods for a cholesterol lowering effect.  Read package labels to identify low-saturated fats, trans fat free, and low-fat foods at the supermarket. Select cheeses that have only 2 to 3 g saturated fat per ounce. Use a heart-healthy tub margarine that is free of trans fats or partially hydrogenated oil. When buying baked goods (cookies, crackers), avoid partially hydrogenated oils. Breads and muffins should be made from whole grains (whole-wheat or whole oat flour, instead of "flour" or "enriched flour"). Buy non-creamy canned soups with reduced salt and no added fats.  FOOD PREPARATION TECHNIQUES  Never deep-fry. If you must fry, either stir-fry, which uses very little fat, or use non-stick cooking sprays. When possible, broil, bake, or roast meats, and steam vegetables. Instead of putting butter or margarine on vegetables, use lemon and herbs, applesauce, and cinnamon (for squash and sweet potatoes). Use nonfat   yogurt, salsa, and low-fat dressings for salads.  LOW-SATURATED FAT / LOW-FAT FOOD SUBSTITUTES Meats / Saturated Fat (g)  Avoid: Steak, marbled (3 oz/85 g) / 11 g  Choose: Steak, lean (3 oz/85 g) / 4 g  Avoid: Hamburger (3 oz/85 g) / 7  g  Choose: Hamburger, lean (3 oz/85 g) / 5 g  Avoid: Ham (3 oz/85 g) / 6 g  Choose: Ham, lean cut (3 oz/85 g) / 2.4 g  Avoid: Chicken, with skin, dark meat (3 oz/85 g) / 4 g  Choose: Chicken, skin removed, dark meat (3 oz/85 g) / 2 g  Avoid: Chicken, with skin, light meat (3 oz/85 g) / 2.5 g  Choose: Chicken, skin removed, light meat (3 oz/85 g) / 1 g Dairy / Saturated Fat (g)  Avoid: Whole milk (1 cup) / 5 g  Choose: Low-fat milk, 2% (1 cup) / 3 g  Choose: Low-fat milk, 1% (1 cup) / 1.5 g  Choose: Skim milk (1 cup) / 0.3 g  Avoid: Hard cheese (1 oz/28 g) / 6 g  Choose: Skim milk cheese (1 oz/28 g) / 2 to 3 g  Avoid: Cottage cheese, 4% fat (1 cup) / 6.5 g  Choose: Low-fat cottage cheese, 1% fat (1 cup) / 1.5 g  Avoid: Ice cream (1 cup) / 9 g  Choose: Sherbet (1 cup) / 2.5 g  Choose: Nonfat frozen yogurt (1 cup) / 0.3 g  Choose: Frozen fruit bar / trace  Avoid: Whipped cream (1 tbs) / 3.5 g  Choose: Nondairy whipped topping (1 tbs) / 1 g Condiments / Saturated Fat (g)  Avoid: Mayonnaise (1 tbs) / 2 g  Choose: Low-fat mayonnaise (1 tbs) / 1 g  Avoid: Butter (1 tbs) / 7 g  Choose: Extra light margarine (1 tbs) / 1 g  Avoid: Coconut oil (1 tbs) / 11.8 g  Choose: Olive oil (1 tbs) / 1.8 g  Choose: Corn oil (1 tbs) / 1.7 g  Choose: Safflower oil (1 tbs) / 1.2 g  Choose: Sunflower oil (1 tbs) / 1.4 g  Choose: Soybean oil (1 tbs) / 2.4 g  Choose: Canola oil (1 tbs) / 1 g Document Released: 08/23/2005 Document Revised: 12/18/2012 Document Reviewed: 02/11/2011 ExitCare Patient Information 2014 [employer].  Hypertension As your heart beats, it forces blood through your arteries. This force is your blood pressure. If the pressure is too high, it is called hypertension (HTN) or high blood pressure. HTN is dangerous because you may have it and not know it. High blood pressure may mean that your heart has to work harder to pump blood. Your arteries may be  narrow or stiff. The extra work puts you at risk for heart disease, stroke, and other problems.  Blood pressure consists of two numbers, a higher number over a lower, 110/72, for example. It is stated as "110 over 72." The ideal is below 120 for the top number (systolic) and under 80 for the bottom (diastolic). Write down your blood pressure today. You should pay close attention to your blood pressure if you have certain conditions such as:  Heart failure.  Prior heart attack.  Diabetes  Chronic kidney disease.  Prior stroke.  Multiple risk factors for heart disease. To see if you have HTN, your blood pressure should be measured while you are seated with your arm held at the level of the heart. It should be measured at least twice. A one-time elevated blood pressure reading (especially in the   Emergency Department) does not mean that you need treatment. There may be conditions in which the blood pressure is different between your right and left arms. It is important to see your caregiver soon for a recheck. Most people have essential hypertension which means that there is not a specific cause. This type of high blood pressure may be lowered by changing lifestyle factors such as:  Stress.  Smoking.  Lack of exercise.  Excessive weight.  Drug/tobacco/alcohol use.  Eating less salt. Most people do not have symptoms from high blood pressure until it has caused damage to the body. Effective treatment can often prevent, delay or reduce that damage. TREATMENT  When a cause has been identified, treatment for high blood pressure is directed at the cause. There are a large number of medications to treat HTN. These fall into several categories, and your caregiver will help you select the medicines that are best for you. Medications may have side effects. You should review side effects with your caregiver. If your blood pressure stays high after you have made lifestyle changes or started on  medicines,   Your medication(s) may need to be changed.  Other problems may need to be addressed.  Be certain you understand your prescriptions, and know how and when to take your medicine.  Be sure to follow up with your caregiver within the time frame advised (usually within two weeks) to have your blood pressure rechecked and to review your medications.  If you are taking more than one medicine to lower your blood pressure, make sure you know how and at what times they should be taken. Taking two medicines at the same time can result in blood pressure that is too low. SEEK IMMEDIATE MEDICAL CARE IF:  You develop a severe headache, blurred or changing vision, or confusion.  You have unusual weakness or numbness, or a faint feeling.  You have severe chest or abdominal pain, vomiting, or breathing problems. MAKE SURE YOU:   Understand these instructions.  Will watch your condition.  Will get help right away if you are not doing well or get worse. Document Released: 08/23/2005 Document Revised: 11/15/2011 Document Reviewed: 04/12/2008 ExitCare Patient Information 2014 [employer].  

## 2013-07-18 NOTE — Progress Notes (Signed)
07/18/2013 [name] [id number] 1933/10/18   History of Present Illness:  Patient is here today for followup of her diarrhea. She is a patient of Dr. Marzetta Board.  She has history of chronic atrial fibrillation, is on Coumadin. She also has tachybradycardia syndrome status post pacemaker placement, has history of coronary artery disease and has a personal history of uterine cancer for which she underwent a hysterectomy and then radiation therapy approximately 10 years ago. Subsequent to that she was also found to have a renal cell Ca, and underwent left nephrectomy. Ever since she had the abdominal radiation she has experienced chronic diarrhea and usually has 5-6 loose bowel movements per day.  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 She was last seen by Dr. Arlyce Dice on October 21 at which time she was still having severe diarrhea. I saw her back in August and placed her on Questran, but that caused her to have a lot of gas and bloating. In October, Dr. Arlyce Dice placed her on a course of Xifaxan 550 mg 3 times a day. She finished a course of that and has been feeling much better. She actually got that medication refilled and is on her second course of that currently. She states that she had only one bowel movement today and it was quite normal. She's only had 2 episodes of diarrhea over the past couple weeks.  Last colonoscopy was in 2012 and showed only diverticulosis at that time.      Current Medications, Allergies, Past Medical History, Past Surgical History, Family History and Social History were reviewed in [employer] record.   Physical Exam: BP 130/60  Pulse 80  Ht 5\' 1"  (1.549 m)  Wt 149 lb 3.2 oz (67.677 kg)  BMI 28.21 kg/m2 General: Elderly white female in no acute distress Head: Normocephalic and atraumatic Eyes:  Sclerae anicteric, conjunctiva pink  Ears: Normal auditory acuity Musculoskeletal:  Symmetrical with no gross deformities Neurological: Alert oriented x 4, grossly nonfocal Psychological:  Alert and cooperative. 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 Symptoms improved after a course of Xifaxan. She is currently taking a second course of the medication, but I advised her that this is not something that she will take continuously. She will finish the course of the medication and then will begin taking Florastor twice a day to try and maintain the balance of her intestinal flora. She was given samples and was told that she can get this medication over-the-counter as well. She will follow up in approximately 8 weeks with Dr. Arlyce Dice.

## 2013-07-18 NOTE — Progress Notes (Signed)
Subjective:    Patient ID: [name], female    DOB: 04-Jan-1934, 79 y.o.   MRN: [id number]  HPI Comments: 79 yo female presents for 3 month F/U for HTN, Cholesterol, Pre-Dm, D. Deficient. Has been doing well overall but has not been eating as healthy or exercising outside  of work.  She has been under increased stress with her husband and dementia issues. Her BP is good when she checks it. She did have a low potassium at last OV but has not changed dose or noticed any SE. POTASSIUM 3.4 T 172 TG 182 H 41 LDL 95 MAG 1.8 A1C 5.6 D 91    Hyperlipidemia  Hypertension     Current Outpatient Prescriptions on File Prior to Visit  Medication Sig Dispense Refill  . amiodarone (PACERONE) 200 MG tablet Take 200 mg by mouth as directed. 1/2 tablet daily      . amLODipine (NORVASC) 10 MG tablet Take 0.5 tablets (5 mg total) by mouth daily.      [name] atenolol (TENORMIN) 100 MG tablet Take 100 mg by mouth 2 (two) times daily.       [name] atorvastatin (LIPITOR) 40 MG tablet Take 20 mg by mouth See admin instructions. Takes 1 tablet Monday-Friday      . calcium-vitamin D (OSCAL WITH D) 500-200 MG-UNIT per tablet Take 1 tablet by mouth daily.      . cholecalciferol (VITAMIN D) 1000 UNITS tablet Take 5,000 Units by mouth every morning.      . famotidine (PEPCID) 10 MG tablet Take 10 mg by mouth daily as needed. For acid reducer      . furosemide (LASIX) 40 MG tablet Take 1 tablet (40 mg total) by mouth daily.  30 tablet  0  . Hyoscyamine Sulfate 0.375 MG TBCR Take 1 tablet by mouth twice daily as needed  30 tablet  1  . levothyroxine (SYNTHROID, LEVOTHROID) 100 MCG tablet Take 50 mcg by mouth See admin instructions. Take 1/2 tab daily except for saturdays and sundays      . losartan-hydrochlorothiazide (HYZAAR) 100-25 MG per tablet Take 1 tablet by mouth daily.       . Magnesium 250 MG TABS Take 1 tablet by mouth every Monday, Wednesday, and Friday.      . potassium chloride SA (K-DUR,KLOR-CON) 20 MEQ tablet  Take 1 tablet (20 mEq total) by mouth 2 (two) times daily.  60 tablet  0  . pregabalin (LYRICA) 75 MG capsule Take 75 mg by mouth 2 (two) times daily.       . rifaximin (XIFAXAN) 200 MG tablet Take 1 tablet (200 mg total) by mouth 3 (three) times daily.  30 tablet  5  . sertraline (ZOLOFT) 50 MG tablet Take 50 mg by mouth every evening.      . warfarin (COUMADIN) 2 MG tablet Take 1-2 mg by mouth as directed.       [name] albuterol (PROVENTIL HFA;VENTOLIN HFA) 108 (90 BASE) MCG/ACT inhaler Inhale 1 puff into the lungs every 6 (six) hours as needed for wheezing or shortness of breath.  1 Inhaler  3   No current facility-administered medications on file prior to visit.   ALLERGIES Codeine; Doxazosin; Requip; Verapamil; Zinc; and Zocor  Past Medical History  Diagnosis Date  . PAF (paroxysmal atrial fibrillation)     controlled with amiodarone, on coumadin  . CAD (coronary artery disease)   . Aortic root dilatation   . Moderate aortic stenosis   . Chronic anticoagulation   .  Tachycardia-bradycardia syndrome     s/p PPM by Dr Deborah Chalk ([facility]) 07/03/10  . GERD (gastroesophageal reflux disease)   . Chronic kidney disease   . Pacemaker     2012  . Arthritis   . History of nephrectomy     left - due to kidney cancer  . Neuropathy     feet/legs  . Restless leg syndrome   . Headache(784.0)     hx migraines - takes Zoloft for migraines  . History of uterine cancer   . Cancer     hx Kidney cancer / hx of endometrial cancer  . PONV (postoperative nausea and vomiting)   . Anxiety   . Shortness of breath   . Heart murmur   . HTN (hypertension)   . Hyperlipemia   . Hypothyroidism     Review of Systems  All other systems reviewed and are negative.   BP 118/70  Pulse 72  Temp(Src) 97.8 F (36.6 C) (Temporal)  Resp 16  Ht 5' 0.5" (1.537 m)  Wt 148 lb (67.132 kg)  BMI 28.42 kg/m2     Objective:   Physical Exam  Nursing note and vitals reviewed. Constitutional: She is oriented to  person, place, and time. She appears well-developed and well-nourished.  HENT:  Head: Normocephalic and atraumatic.  Right Ear: External ear normal.  Left Ear: External ear normal.  Nose: Nose normal.  Mouth/Throat: Oropharynx is clear and moist.  Eyes: Conjunctivae are normal. Pupils are equal, round, and reactive to light.  Neck: Normal range of motion. Neck supple. No thyromegaly present.  Cardiovascular: Normal rate, regular rhythm, normal heart sounds and intact distal pulses.   Pulmonary/Chest: Effort normal and breath sounds normal.  Abdominal: Soft. Bowel sounds are normal.  Musculoskeletal: Normal range of motion.  Walks with cain  Lymphadenopathy:    She has no cervical adenopathy.  Neurological: She is alert and oriented to person, place, and time.  Skin: Skin is warm and dry.  Psychiatric: She has a normal mood and affect. Judgment normal.          Assessment & Plan:  1. 3 month F/U for HTN, Cholesterol, Pre-Dm, D. Deficient check labs 2. Stress- long discussion about dementia care providers w/c if needs assistance

## 2013-07-18 NOTE — Patient Instructions (Signed)
Today we are giving you samples of Florastor, take one capsule twice a day.  When finished with the samples you may purchase this over the counter to continue it.  Continue your xifaxin.   Follow up with Dr. Arlyce Dice in 2 months.   I appreciate the opportunity to care for you.

## 2013-07-19 LAB — HEPATIC FUNCTION PANEL
AST: 27 U/L (ref 0–37)
Albumin: 3.9 g/dL (ref 3.5–5.2)
Alkaline Phosphatase: 118 U/L — ABNORMAL HIGH (ref 39–117)
Total Protein: 7.1 g/dL (ref 6.0–8.3)

## 2013-07-19 LAB — CBC WITH DIFFERENTIAL/PLATELET
Basophils Absolute: 0 10*3/uL (ref 0.0–0.1)
Eosinophils Absolute: 0.2 10*3/uL (ref 0.0–0.7)
Eosinophils Relative: 4 % (ref 0–5)
HCT: 39.8 % (ref 36.0–46.0)
Lymphocytes Relative: 34 % (ref 12–46)
MCH: 31.1 pg (ref 26.0–34.0)
MCV: 90.2 fL (ref 78.0–100.0)
Monocytes Absolute: 0.7 10*3/uL (ref 0.1–1.0)
RDW: 14.3 % (ref 11.5–15.5)
WBC: 5.8 10*3/uL (ref 4.0–10.5)

## 2013-07-19 LAB — BASIC METABOLIC PANEL WITH GFR
BUN: 31 mg/dL — ABNORMAL HIGH (ref 6–23)
CO2: 27 mEq/L (ref 19–32)
Chloride: 104 mEq/L (ref 96–112)
Creat: 1.5 mg/dL — ABNORMAL HIGH (ref 0.50–1.10)
GFR, Est Non African American: 33 mL/min — ABNORMAL LOW

## 2013-07-19 LAB — HEMOGLOBIN A1C
Hgb A1c MFr Bld: 5.4 % (ref <5.7)
Mean Plasma Glucose: 108 mg/dL (ref <117)

## 2013-07-19 LAB — INSULIN, FASTING: Insulin fasting, serum: 14 u[IU]/mL (ref 3–28)

## 2013-07-19 LAB — VITAMIN D 25 HYDROXY (VIT D DEFICIENCY, FRACTURES): Vit D, 25-Hydroxy: 77 ng/mL (ref 30–89)

## 2013-07-19 NOTE — Progress Notes (Signed)
Antibiotic therapy for bacterial overgrowth may be required 1 week out of the month.

## 2013-07-30 ENCOUNTER — Ambulatory Visit (INDEPENDENT_AMBULATORY_CARE_PROVIDER_SITE_OTHER): Payer: 59 | Admitting: *Deleted

## 2013-07-30 DIAGNOSIS — I4891 Unspecified atrial fibrillation: Secondary | ICD-10-CM

## 2013-07-30 DIAGNOSIS — I495 Sick sinus syndrome: Secondary | ICD-10-CM

## 2013-08-06 ENCOUNTER — Ambulatory Visit: Payer: 59 | Admitting: Gastroenterology

## 2013-08-06 DIAGNOSIS — I4891 Unspecified atrial fibrillation: Secondary | ICD-10-CM

## 2013-08-06 DIAGNOSIS — I495 Sick sinus syndrome: Secondary | ICD-10-CM

## 2013-08-07 ENCOUNTER — Ambulatory Visit: Payer: 59 | Admitting: Cardiology

## 2013-08-09 LAB — MDC_IDC_ENUM_SESS_TYPE_REMOTE
Battery Voltage: 2.81 V
Brady Statistic AP VS Percent: 99 %
Brady Statistic AS VP Percent: 0 %
Brady Statistic AS VS Percent: 1 %
Date Time Interrogation Session: 20141201135723
Lead Channel Impedance Value: 392 Ohm
Lead Channel Pacing Threshold Amplitude: 0.375 V
Lead Channel Pacing Threshold Amplitude: 0.625 V
Lead Channel Pacing Threshold Pulse Width: 0.4 ms
Lead Channel Pacing Threshold Pulse Width: 0.4 ms
Lead Channel Sensing Intrinsic Amplitude: 16 mV
Lead Channel Setting Pacing Pulse Width: 0.4 ms

## 2013-08-10 ENCOUNTER — Ambulatory Visit (INDEPENDENT_AMBULATORY_CARE_PROVIDER_SITE_OTHER): Payer: 59 | Admitting: Cardiology

## 2013-08-10 ENCOUNTER — Ambulatory Visit (INDEPENDENT_AMBULATORY_CARE_PROVIDER_SITE_OTHER): Payer: 59 | Admitting: *Deleted

## 2013-08-10 ENCOUNTER — Encounter: Payer: Self-pay | Admitting: Cardiology

## 2013-08-10 VITALS — BP 125/74 | HR 68 | Ht 60.5 in | Wt 146.0 lb

## 2013-08-10 DIAGNOSIS — I119 Hypertensive heart disease without heart failure: Secondary | ICD-10-CM

## 2013-08-10 DIAGNOSIS — I359 Nonrheumatic aortic valve disorder, unspecified: Secondary | ICD-10-CM

## 2013-08-10 DIAGNOSIS — I4891 Unspecified atrial fibrillation: Secondary | ICD-10-CM

## 2013-08-10 DIAGNOSIS — I509 Heart failure, unspecified: Secondary | ICD-10-CM

## 2013-08-10 DIAGNOSIS — I495 Sick sinus syndrome: Secondary | ICD-10-CM

## 2013-08-10 DIAGNOSIS — E039 Hypothyroidism, unspecified: Secondary | ICD-10-CM

## 2013-08-10 DIAGNOSIS — I350 Nonrheumatic aortic (valve) stenosis: Secondary | ICD-10-CM

## 2013-08-10 DIAGNOSIS — I10 Essential (primary) hypertension: Secondary | ICD-10-CM

## 2013-08-10 DIAGNOSIS — I480 Paroxysmal atrial fibrillation: Secondary | ICD-10-CM

## 2013-08-10 LAB — POCT INR: INR: 2

## 2013-08-10 NOTE — Progress Notes (Signed)
[name] Date of Birth:  [birth year] [address] [place], [place]  [address] [phone]         Fax   [phone]  History of Present Illness: This pleasant 79 year old woman is seen for a scheduled followup office visit. She has a past history of tachybradycardia syndrome . She has a permanent pacemaker implanted 07/01/10 . She has a remote history of repair of a patent ductus arteriosus. She has a history of moderate valvular aortic stenosis. She has had a history of congestive heart failure. Most recent admission was 08/25/12-08/29/12 for exacerbation of CHF.  Prior to her going into the hospital she had been on Lasix only 3 times a week and she was sent out on daily Lasix 40 mg daily. Since last visit she has been doing well.  Her last echocardiogram 08/26/12 showed ejection fraction of 60-65% with grade 2 diastolic dysfunction and a peak aortic valve gradient of 29 with a mean aortic valve gradient of 14.  Current Outpatient Prescriptions  Medication Sig Dispense Refill  . albuterol (PROVENTIL HFA;VENTOLIN HFA) 108 (90 BASE) MCG/ACT inhaler Inhale 1 puff into the lungs every 6 (six) hours as needed for wheezing or shortness of breath.  1 Inhaler  3  . amiodarone (PACERONE) 200 MG tablet Take 200 mg by mouth as directed. 1/2 tablet daily      . atenolol (TENORMIN) 100 MG tablet Take 100 mg by mouth 2 (two) times daily.       [name] atorvastatin (LIPITOR) 40 MG tablet Take 20 mg by mouth See admin instructions. Takes 1 tablet Monday-Friday      . calcium-vitamin D (OSCAL WITH D) 500-200 MG-UNIT per tablet Take 1 tablet by mouth daily.      . cholecalciferol (VITAMIN D) 1000 UNITS tablet Take 5,000 Units by mouth every morning.      . famotidine (PEPCID) 10 MG tablet Take 10 mg by mouth daily as needed. For acid reducer      . furosemide (LASIX) 40 MG tablet Take 1 tablet (40 mg total) by mouth daily.  30 tablet  0  . levothyroxine (SYNTHROID, LEVOTHROID) 100 MCG tablet  Take 50 mcg by mouth See admin instructions. Take 1/2 tab daily except for saturdays and sundays      . losartan-hydrochlorothiazide (HYZAAR) 100-25 MG per tablet Take 1 tablet by mouth daily.       . Magnesium 250 MG TABS Take 1 tablet by mouth every Monday, Wednesday, and Friday.      . potassium chloride SA (K-DUR,KLOR-CON) 20 MEQ tablet Take 1 tablet (20 mEq total) by mouth 2 (two) times daily.  60 tablet  0  . pregabalin (LYRICA) 75 MG capsule Take 75 mg by mouth 2 (two) times daily.       . sertraline (ZOLOFT) 50 MG tablet Take 50 mg by mouth every evening.      . warfarin (COUMADIN) 2 MG tablet Take 1-2 mg by mouth as directed.        No current facility-administered medications for this visit.    Allergies  Allergen Reactions  . Codeine Nausea And Vomiting  . Doxazosin   . Requip [Ropinirole Hcl]     Headache   . Verapamil   . Zinc     nausea  . Zocor [Simvastatin]     Patient Active Problem List   Diagnosis Date Noted  . Atrial fibrillation 12/02/2010    Priority: High  . HYPERTENSION 11/17/2007  Priority: Medium  . HTN (hypertension)   . Hyperlipemia   . Hypothyroidism   . Malaise and fatigue 09/14/2012  . Chest pain 08/26/2012  . SOB (shortness of breath) 08/26/2012  . Subtherapeutic international normalized ratio (INR) 08/26/2012  . Pacemaker 08/26/2012  . CHF, acute on chronic 02/11/2012  . Acute respiratory failure with hypoxia 02/10/2012  . S/P total knee arthroplasty 02/10/2012  . S/P right TKA 02/07/2012  . Family history of malignant neoplasm of gastrointestinal tract 07/27/2011  . Benign neoplasm of stomach 07/27/2011  . Diarrhea 07/27/2011  . Osteoarthritis 07/09/2011  . Aortic stenosis, moderate 07/09/2011  . S/P repair of patent ductus arteriosus 04/22/2011  . Tachycardia-bradycardia syndrome 12/24/2010  . RENAL CELL CANCER 11/17/2007  . HYPOTHYROIDISM 11/17/2007  . HYPERLIPIDEMIA 11/17/2007  . ANXIETY 11/17/2007  . DEPRESSION 11/17/2007    . RESTLESS LEG SYNDROME 11/17/2007  . Gastroparesis 11/17/2007  . RADIATION PROCTITIS 11/17/2007  . FIBROCYSTIC BREAST DISEASE 11/17/2007  . OSTEOMYELITIS 11/17/2007  . CARCINOMA, UTERUS, HX OF 11/17/2007  . RADIATION THERAPY, HX OF 11/17/2007  . BENIGN NEOPLASM OTH&UNSPEC SITE DIGESTIVE SYSTEM 01/11/2007    History  Smoking status  . Never Smoker   Smokeless tobacco  . Never Used    History  Alcohol Use No    Family History  Problem Relation Age of Onset  . Colon cancer Mother 41  . Heart disease Mother   . Heart disease Father   . Heart attack Father   . Heart disease Maternal Grandmother     Review of Systems: Constitutional: no fever chills diaphoresis or fatigue or change in weight.  Head and neck: no hearing loss, no epistaxis, no photophobia or visual disturbance. Respiratory: No cough, shortness of breath or wheezing. Cardiovascular: No chest pain peripheral edema, palpitations. Gastrointestinal: No abdominal distention, no abdominal pain, no change in bowel habits hematochezia or melena. Genitourinary: No dysuria, no frequency, no urgency, no nocturia. Musculoskeletal:No arthralgias, no back pain, no gait disturbance or myalgias. Neurological: No dizziness, no headaches, no numbness, no seizures, no syncope, no weakness, no tremors. Hematologic: No lymphadenopathy, no easy bruising. Psychiatric: No confusion, no hallucinations, no sleep disturbance.    Physical Exam: Filed Vitals:   08/10/13 1036  BP: 125/74  Pulse: 68   the general appearance reveals a well-developed elderly woman in no distress.  She is alert.  She still works full time.The head and neck exam reveals pupils equal and reactive.  Extraocular movements are full.  There is no scleral icterus.  The mouth and pharynx are normal.  The neck is supple.  The carotids reveal no bruits.  The jugular venous pressure is normal.  The  thyroid is not enlarged.  There is no lymphadenopathy.  The chest is  clear to percussion and auscultation.  There are no rales or rhonchi.  Expansion of the chest is symmetrical.  The precordium is quiet.  The first heart sound is normal.  The second heart sound is physiologically split.  There is grade 2/6 systolic ejection murmur at the base.  No diastolic murmur.  There is no abnormal lift or heave.  The abdomen is soft and nontender.  The bowel sounds are normal.  The liver and spleen are not enlarged.  There are no abdominal masses.  There are no abdominal bruits.  Extremities reveal weak pedal pulses.  There is no phlebitis or edema.  There is no cyanosis or clubbing.  Strength is normal and symmetrical in all extremities.  There is no lateralizing  weakness.  There are no sensory deficits.  The skin is warm and dry.  There is no rash.     Assessment / Plan:  The patient is doing well.  She continues to work full-time.  She hopes to continue to work but her husband is starting to have some memory issues which may need her to have to stay home more.  The patient is to continue on same medication.  Recheck in 4 months for followup office visit and EKG.  Followup in Coumadin clinic as directed.

## 2013-08-10 NOTE — Assessment & Plan Note (Signed)
The patient has a history of chronic diastolic CHF.  She has been doing well on daily Lasix 40 mg daily with no recurrent symptoms of CHF

## 2013-08-10 NOTE — Assessment & Plan Note (Signed)
The patient has only mild to moderate aortic stenosis.  She is not having exertional chest pain or angina.  No exertional dizziness or syncope.  No symptoms of CHF.

## 2013-08-10 NOTE — Assessment & Plan Note (Signed)
The patient has not been experiencing any awareness of rapid heart action or paroxysmal atrial fibrillation.  She is on long-term Coumadin anticoagulation.

## 2013-08-10 NOTE — Assessment & Plan Note (Signed)
Her blood pressure has been running very good at work.  It is in the 125/74 range.  She has not been having any symptoms referable to her high blood pressure.

## 2013-08-10 NOTE — Patient Instructions (Signed)
Your physician recommends that you continue on your current medications as directed. Please refer to the Current Medication list given to you today.  Your physician wants you to follow-up in: 4 months with ekg You will receive a reminder letter in the mail two months in advance. If you don't receive a letter, please call our office to schedule the follow-up appointment.

## 2013-08-10 NOTE — Assessment & Plan Note (Signed)
The patient is clinically euthyroid.  She is on Synthroid medication.  Her labs are followed by her PCP.

## 2013-08-21 ENCOUNTER — Encounter: Payer: Self-pay | Admitting: *Deleted

## 2013-08-29 ENCOUNTER — Encounter: Payer: Self-pay | Admitting: Internal Medicine

## 2013-09-05 ENCOUNTER — Telehealth: Payer: Self-pay | Admitting: Cardiology

## 2013-09-05 NOTE — Telephone Encounter (Signed)
Notified to come in tomorrow and we will keep her 12:00 on the books.

## 2013-09-05 NOTE — Telephone Encounter (Signed)
New Problem:  Pt is requesting she be worked into our coumadin clinics schedule. Pt's husband has an appt here in [place] on 09/07/13 at 4pm... Pt is requesting she be worked in this Friday at 3pm. The Coumadin Clinic sched is full... Pt would like a call back letting her know if she can come in any later than what she is scheduled for as it would save her gas.

## 2013-09-06 DIAGNOSIS — H5440 Blindness, one eye, unspecified eye: Secondary | ICD-10-CM

## 2013-09-06 DIAGNOSIS — I639 Cerebral infarction, unspecified: Secondary | ICD-10-CM

## 2013-09-06 HISTORY — DX: Cerebral infarction, unspecified: I63.9

## 2013-09-06 HISTORY — DX: Blindness, one eye, unspecified eye: H54.40

## 2013-09-07 ENCOUNTER — Ambulatory Visit (INDEPENDENT_AMBULATORY_CARE_PROVIDER_SITE_OTHER): Payer: 59 | Admitting: Pharmacist

## 2013-09-07 DIAGNOSIS — I4891 Unspecified atrial fibrillation: Secondary | ICD-10-CM

## 2013-09-07 LAB — POCT INR: INR: 1.4

## 2013-09-13 ENCOUNTER — Other Ambulatory Visit: Payer: Self-pay | Admitting: Internal Medicine

## 2013-09-24 ENCOUNTER — Ambulatory Visit (INDEPENDENT_AMBULATORY_CARE_PROVIDER_SITE_OTHER): Payer: 59 | Admitting: Internal Medicine

## 2013-09-24 ENCOUNTER — Ambulatory Visit (INDEPENDENT_AMBULATORY_CARE_PROVIDER_SITE_OTHER): Payer: 59 | Admitting: Pharmacist

## 2013-09-24 ENCOUNTER — Encounter: Payer: Self-pay | Admitting: Internal Medicine

## 2013-09-24 VITALS — BP 126/74 | HR 76 | Temp 98.1°F | Resp 16 | Ht 60.5 in | Wt 147.2 lb

## 2013-09-24 DIAGNOSIS — E559 Vitamin D deficiency, unspecified: Secondary | ICD-10-CM

## 2013-09-24 DIAGNOSIS — R7303 Prediabetes: Secondary | ICD-10-CM | POA: Insufficient documentation

## 2013-09-24 DIAGNOSIS — I10 Essential (primary) hypertension: Secondary | ICD-10-CM

## 2013-09-24 DIAGNOSIS — Z0000 Encounter for general adult medical examination without abnormal findings: Secondary | ICD-10-CM

## 2013-09-24 DIAGNOSIS — Z79899 Other long term (current) drug therapy: Secondary | ICD-10-CM

## 2013-09-24 DIAGNOSIS — Z1212 Encounter for screening for malignant neoplasm of rectum: Secondary | ICD-10-CM

## 2013-09-24 DIAGNOSIS — I4891 Unspecified atrial fibrillation: Secondary | ICD-10-CM

## 2013-09-24 DIAGNOSIS — R7309 Other abnormal glucose: Secondary | ICD-10-CM

## 2013-09-24 DIAGNOSIS — E782 Mixed hyperlipidemia: Secondary | ICD-10-CM

## 2013-09-24 LAB — CBC WITH DIFFERENTIAL/PLATELET
Basophils Absolute: 0 10*3/uL (ref 0.0–0.1)
Basophils Relative: 0 % (ref 0–1)
Eosinophils Absolute: 0.4 10*3/uL (ref 0.0–0.7)
Eosinophils Relative: 5 % (ref 0–5)
HEMATOCRIT: 42.5 % (ref 36.0–46.0)
HEMOGLOBIN: 14.7 g/dL (ref 12.0–15.0)
LYMPHS ABS: 1.9 10*3/uL (ref 0.7–4.0)
Lymphocytes Relative: 20 % (ref 12–46)
MCH: 31.1 pg (ref 26.0–34.0)
MCHC: 34.6 g/dL (ref 30.0–36.0)
MCV: 90 fL (ref 78.0–100.0)
Monocytes Absolute: 0.7 10*3/uL (ref 0.1–1.0)
Monocytes Relative: 8 % (ref 3–12)
Neutro Abs: 6.3 10*3/uL (ref 1.7–7.7)
Neutrophils Relative %: 67 % (ref 43–77)
PLATELETS: 220 10*3/uL (ref 150–400)
RBC: 4.72 MIL/uL (ref 3.87–5.11)
RDW: 14.2 % (ref 11.5–15.5)
WBC: 9.3 10*3/uL (ref 4.0–10.5)

## 2013-09-24 LAB — BASIC METABOLIC PANEL WITH GFR
BUN: 21 mg/dL (ref 6–23)
CO2: 26 mEq/L (ref 19–32)
CREATININE: 1.22 mg/dL — AB (ref 0.50–1.10)
Calcium: 9.2 mg/dL (ref 8.4–10.5)
Chloride: 105 mEq/L (ref 96–112)
GFR, Est African American: 49 mL/min — ABNORMAL LOW
GFR, Est Non African American: 43 mL/min — ABNORMAL LOW
Glucose, Bld: 87 mg/dL (ref 70–99)
Potassium: 3.7 mEq/L (ref 3.5–5.3)
Sodium: 144 mEq/L (ref 135–145)

## 2013-09-24 LAB — HEPATIC FUNCTION PANEL
ALBUMIN: 4.3 g/dL (ref 3.5–5.2)
ALK PHOS: 110 U/L (ref 39–117)
ALT: 19 U/L (ref 0–35)
AST: 24 U/L (ref 0–37)
BILIRUBIN DIRECT: 0.1 mg/dL (ref 0.0–0.3)
BILIRUBIN TOTAL: 0.7 mg/dL (ref 0.3–1.2)
Indirect Bilirubin: 0.6 mg/dL (ref 0.0–0.9)
Total Protein: 7.4 g/dL (ref 6.0–8.3)

## 2013-09-24 LAB — LIPID PANEL
CHOL/HDL RATIO: 3.7 ratio
Cholesterol: 157 mg/dL (ref 0–200)
HDL: 43 mg/dL (ref >39)
LDL CALC: 87 mg/dL (ref 0–99)
Triglycerides: 135 mg/dL (ref <150)
VLDL: 27 mg/dL (ref 0–40)

## 2013-09-24 LAB — HEMOGLOBIN A1C
HEMOGLOBIN A1C: 5.6 % (ref <5.7)
Mean Plasma Glucose: 114 mg/dL (ref <117)

## 2013-09-24 LAB — POCT INR: INR: 2.2

## 2013-09-24 LAB — MAGNESIUM: Magnesium: 1.7 mg/dL (ref 1.5–2.5)

## 2013-09-24 MED ORDER — TRIAMCINOLONE ACETONIDE 0.1 % EX CREA: 1.0000 "application " | TOPICAL_CREAM | Freq: Two times a day (BID) | CUTANEOUS | Status: DC

## 2013-09-24 MED ORDER — PREDNISONE 20 MG PO TABS: 20.0000 mg | ORAL_TABLET | ORAL | Status: DC

## 2013-09-24 NOTE — Progress Notes (Signed)
Patient ID: [name], female   DOB: Jan 09, 1934, 80 y.o.   MRN: [id number]  Annual Screening Comprehensive Examination  This very nice 80 y.o. MWF presents for complete physical.  Patient has been followed for HTN, ASHD/chAfb& Perm Pacemaker, Hx CHF, Depression Prediabetes, Hypothyroidism, Hx/o Cancer of Kidney and Uterus, Hyperlipidemia, and Vitamin D Deficiency.    HTN predates since 1960's. Patient's BP has been controlled at home. Today's BP: 126/74 mmHg. In 2008 she developed Afib and had a heart cath showing No CAD with EF 60%. In 2011 she had permanent pacemaker for 3rd degree HB. Patient denies any cardiac symptoms as chest pain, palpitations, shortness of breath, dizziness or ankle swelling.   Patient's hyperlipidemia is controlled with diet and medications. Patient denies myalgias or other medication SE's. Last cholesterol last visit was 172, triglycerides 182, HDL 41 and LDL 98 in Aug 2014.     Patient has prediabetes with A1c 6.1%  predating since 2012 with last A1c 5.6% in Aug 2014. Patient denies reactive hypoglycemic symptoms, visual blurring, diabetic polys, or paresthesias.    Finally, patient has history of Vitamin D Deficiency of 19 in 2008with last vitamin D 91 in Aug 2014.       Medication List         albuterol 108 (90 BASE) MCG/ACT inhaler  Commonly known as:  PROVENTIL HFA;VENTOLIN HFA  Inhale 1 puff into the lungs every 6 (six) hours as needed for wheezing or shortness of breath.     amiodarone 200 MG tablet  Commonly known as:  PACERONE  Take 200 mg by mouth as directed. 1/2 tablet daily     atenolol 100 MG tablet  Commonly known as:  TENORMIN  TAKE 1 TABLET TWICE A DAY FOR BLOOD PRESSURE     atorvastatin 40 MG tablet  Commonly known as:  LIPITOR  Take 20 mg by mouth See admin instructions. Takes 1 tablet Monday-Friday     calcium-vitamin D 500-200 MG-UNIT per tablet  Commonly known as:  OSCAL WITH D  Take 1 tablet by mouth daily.      cholecalciferol 1000 UNITS tablet  Commonly known as:  VITAMIN D  Take 5,000 Units by mouth every morning.     famotidine 10 MG tablet  Commonly known as:  PEPCID  Take 10 mg by mouth daily as needed. For acid reducer     FLORA-Q Caps capsule  Take 1 capsule by mouth daily. Takes 2 caps daily     furosemide 40 MG tablet  Commonly known as:  LASIX  Take 1 tablet (40 mg total) by mouth daily.     levothyroxine 100 MCG tablet  Commonly known as:  SYNTHROID, LEVOTHROID  Take 50 mcg by mouth See admin instructions. Take 1/2 tab daily except for saturdays and sundays     losartan-hydrochlorothiazide 100-25 MG per tablet  Commonly known as:  HYZAAR  Take 1 tablet by mouth daily.     Magnesium 250 MG Tabs  Take 1 tablet by mouth every Monday, Wednesday, and Friday.     potassium chloride SA 20 MEQ tablet  Commonly known as:  K-DUR,KLOR-CON  Take 1 tablet (20 mEq total) by mouth 2 (two) times daily.     predniSONE 20 MG tablet  Commonly known as:  DELTASONE  Take 1 tablet (20 mg total) by mouth See admin instructions. 1 tab 3 x day for 3 days, then 1 tab 2 x day for 3 days, then 1 tab 1 x day for  5 days     pregabalin 75 MG capsule  Commonly known as:  LYRICA  Take 75 mg by mouth 2 (two) times daily.     sertraline 50 MG tablet  Commonly known as:  ZOLOFT  Take 50 mg by mouth every evening.     triamcinolone cream 0.1 %  Commonly known as:  KENALOG  Apply 1 application topically 2 (two) times daily.     warfarin 2 MG tablet  Commonly known as:  COUMADIN  Take 1-2 mg by mouth as directed.        Allergies  Allergen Reactions  . Codeine Nausea And Vomiting  . Doxazosin   . Requip [Ropinirole Hcl]     Headache   . Verapamil   . Zinc     nausea  . Zocor [Simvastatin]     Past Medical History  Diagnosis Date  . PAF (paroxysmal atrial fibrillation)     controlled with amiodarone, on coumadin  . CAD (coronary artery disease)   . Aortic root dilatation   .  Moderate aortic stenosis   . Chronic anticoagulation   . Tachycardia-bradycardia syndrome     s/p PPM by Dr Doreatha Lew ([facility]) 07/03/10  . GERD (gastroesophageal reflux disease)   . Chronic kidney disease   . Pacemaker     2012  . Arthritis   . History of nephrectomy     left - due to kidney cancer  . Neuropathy     feet/legs  . Restless leg syndrome   . Headache(784.0)     hx migraines - takes Zoloft for migraines  . History of uterine cancer   . Cancer     hx Kidney cancer / hx of endometrial cancer  . PONV (postoperative nausea and vomiting)   . Anxiety   . Shortness of breath   . Heart murmur   . HTN (hypertension)   . Hyperlipemia   . Hypothyroidism     Past Surgical History  Procedure Laterality Date  . Cardiac catheterization  2008  . Nephrectomy    . Total abdominal hysterectomy    . Patent ductus arterious repair  age 61  . Ptvp    . Pacemaker insertion  07/04/11    by Dr Doreatha Lew  . Cholecystectomy    . Upper gastrointestinal endoscopy    . Joint replacement      rt total hip 2008  . Total knee arthroplasty  02/07/2012    Procedure: TOTAL KNEE ARTHROPLASTY;  Surgeon: Mauri Pole, MD;  Location: [facility];  Service: Orthopedics;  Laterality: Right;  . Insert / replace / remove pacemaker      Family History  Problem Relation Age of Onset  . Colon cancer Mother 41  . Heart disease Mother   . Heart disease Father   . Heart attack Father   . Heart disease Maternal Grandmother     History  Substance Use Topics  . Smoking status: Never Smoker   . Smokeless tobacco: Never Used  . Alcohol Use: No    ROS Constitutional: Denies fever, chills, weight loss/gain, headaches, insomnia, fatigue, night sweats, and change in appetite. Eyes: Denies redness, blurred vision, diplopia, discharge, itchy, watery eyes.  ENT: Denies discharge, congestion, post nasal drip, epistaxis, sore throat, earache, hearing loss, dental pain, Tinnitus, Vertigo, Sinus pain, snoring.   Cardio: Denies chest pain, palpitations, irregular heartbeat, syncope, dyspnea, diaphoresis, orthopnea, PND, claudication, edema Respiratory: denies cough, dyspnea, DOE, pleurisy, hoarseness, laryngitis, wheezing.  Gastrointestinal: Denies dysphagia, heartburn, reflux,  water brash, pain, cramps, nausea, vomiting, bloating, diarrhea, constipation, hematemesis, melena, hematochezia, jaundice, hemorrhoids Genitourinary: Denies dysuria, frequency, urgency, nocturia, hesitancy, discharge, hematuria, flank pain Breast:Breast lumps, nipple discharge, bleeding.  Musculoskeletal: Denies arthralgia, myalgia, stiffness, Jt. Swelling, pain, limp, and strain/sprain. Skin: Denies puritis, rash, hives, warts, acne, eczema, changing in skin lesion Neuro: No weakness, tremor, incoordination, spasms, paresthesia, pain Psychiatric: Denies confusion, memory loss, sensory loss Endocrine: Denies change in weight, skin, hair change, nocturia, and paresthesia, diabetic polys, visual blurring, hyper / hypo glycemic episodes.  Heme/Lymph: No excessive bleeding, bruising, enlarged lymph nodes.  BP: 126/74  Pulse: 76  Temp: 98.1 F (36.7 C)  Resp: 16    Estimated body mass index is 28.26 kg/(m^2) as calculated from the following:   Height as of this encounter: 5' 0.5" (1.537 m).   Weight as of this encounter: 147 lb 3.2 oz (66.769 kg).  Physical Exam General Appearance: Well nourished, in no apparent distress. Eyes: PERRLA, EOMs, conjunctiva no swelling or erythema, normal fundi and vessels. Sinuses: No frontal/maxillary tenderness ENT/Mouth: EACs patent / TMs  nl. Nares clear without erythema, swelling, mucoid exudates. Oral hygiene is good. No erythema, swelling, or exudate. Tongue normal, non-obstructing. Tonsils not swollen or erythematous. Hearing normal.  Neck: Supple, thyroid normal. No bruits, nodes or JVD. Respiratory: Respiratory effort normal.  BS equal and clear bilateral without rales, rhonci,  wheezing or stridor. Cardio: Heart sounds are normal with regular rate and rhythm and no murmurs, rubs or gallops. Peripheral pulses are normal and equal bilaterally without edema. No aortic or femoral bruits. Chest: symmetric with normal excursions and percussion. Breasts: Symmetric, without lumps, nipple discharge, retractions, or fibrocystic changes.  Abdomen: Flat, soft, with bowl sounds. Nontender, no guarding, rebound, hernias, masses, or organomegaly.  Lymphatics: Non tender without lymphadenopathy.  Genitourinary:  Musculoskeletal: Full ROM all peripheral extremities, joint stability, 5/5 strength, and normal gait. Skin: Warm and dry without rashes, lesions, cyanosis, clubbing or  ecchymosis.  Neuro: Cranial nerves intact, reflexes equal bilaterally. Normal muscle tone, no cerebellar symptoms. Sensation intact.  Pysch: Awake and oriented X 3, normal affect, Insight and Judgment appropriate.   Assessment and Plan  1. Annual Screening Examination 2. Hypertension  3. Hyperlipidemia 4. Pre Diabetes 5. Vitamin D Deficiency 6. ASHD/Afib/CHF/CHB-Pacer 7. Hypothyroidism 8. Hx/o Kidney & Uterine Cancer  Continue prudent diet as discussed, weight control, BP monitoring, regular exercise, and medications. Discussed med's effects and SE's. Screening labs and tests as requested with regular follow-up as recommended.

## 2013-09-24 NOTE — Patient Instructions (Signed)

## 2013-09-25 LAB — URINALYSIS, MICROSCOPIC ONLY
BACTERIA UA: NONE SEEN
CASTS: NONE SEEN
Crystals: NONE SEEN
Squamous Epithelial / LPF: NONE SEEN

## 2013-09-25 LAB — MICROALBUMIN / CREATININE URINE RATIO
CREATININE, URINE: 16.9 mg/dL
MICROALB/CREAT RATIO: 29.6 mg/g (ref 0.0–30.0)
Microalb, Ur: 0.5 mg/dL (ref 0.00–1.89)

## 2013-09-25 LAB — VITAMIN D 25 HYDROXY (VIT D DEFICIENCY, FRACTURES): VIT D 25 HYDROXY: 77 ng/mL (ref 30–89)

## 2013-09-25 LAB — INSULIN, FASTING: INSULIN FASTING, SERUM: 6 u[IU]/mL (ref 3–28)

## 2013-09-25 LAB — TSH: TSH: 1.221 u[IU]/mL (ref 0.350–4.500)

## 2013-10-08 ENCOUNTER — Telehealth: Payer: Self-pay | Admitting: Cardiology

## 2013-10-08 NOTE — Telephone Encounter (Signed)
Advised patient. She was feeling so poorly, worked in [employer]

## 2013-10-08 NOTE — Telephone Encounter (Signed)
Having lots of fatigue, weakness. Blood pressure earlier this am 95/65 hr 80, has taken since then and blood pressure 102/69 hr 83. Has just been laying around not doing anything. Does get a little shortness of breath when she gets up to do anything, but denies any pain, fever, cough, or other symptoms. States she had been doing good until she got up yesterday. Will forward to  Dr. Mare Ferrari for review

## 2013-10-08 NOTE — Telephone Encounter (Signed)
Since her blood pressure is low she should temporarily leave off her losartan HCT.  Note indicates that she has gone into atrial fib.  She does have a pacemaker in place.  She should increase her amiodarone to a full tablet twice a day temporarily.  See for a work in on February 4 if she has not gone back into sinus rhythm by then.

## 2013-10-08 NOTE — Telephone Encounter (Signed)
New message   C/O went into AFIB -90 -100   on yesterday . Blood pressure 98/66. Right arm . Heart rate 85.

## 2013-10-09 ENCOUNTER — Encounter: Payer: Self-pay | Admitting: Cardiology

## 2013-10-09 ENCOUNTER — Ambulatory Visit (INDEPENDENT_AMBULATORY_CARE_PROVIDER_SITE_OTHER): Payer: 59 | Admitting: Cardiology

## 2013-10-09 ENCOUNTER — Ambulatory Visit (INDEPENDENT_AMBULATORY_CARE_PROVIDER_SITE_OTHER): Payer: 59 | Admitting: Pharmacist

## 2013-10-09 VITALS — BP 120/80 | HR 64 | Ht 60.5 in | Wt 145.8 lb

## 2013-10-09 DIAGNOSIS — I359 Nonrheumatic aortic valve disorder, unspecified: Secondary | ICD-10-CM

## 2013-10-09 DIAGNOSIS — I119 Hypertensive heart disease without heart failure: Secondary | ICD-10-CM

## 2013-10-09 DIAGNOSIS — I4891 Unspecified atrial fibrillation: Secondary | ICD-10-CM

## 2013-10-09 DIAGNOSIS — I495 Sick sinus syndrome: Secondary | ICD-10-CM

## 2013-10-09 DIAGNOSIS — I480 Paroxysmal atrial fibrillation: Secondary | ICD-10-CM

## 2013-10-09 DIAGNOSIS — I10 Essential (primary) hypertension: Secondary | ICD-10-CM

## 2013-10-09 DIAGNOSIS — I350 Nonrheumatic aortic (valve) stenosis: Secondary | ICD-10-CM

## 2013-10-09 LAB — POCT INR: INR: 2.7

## 2013-10-09 NOTE — Progress Notes (Signed)
[name] Date of Birth:  Sep 16, 1933 [address] [place], [place]  [address] [phone]         Fax   [phone]  History of Present Illness: This pleasant 80 year old woman is seen for a work in  office visit.  She had called yesterday complaining of extreme weakness and of having gone into atrial fibrillation the day before with a rapid ventricular response.  We had her increase her amiodarone yesterday and today she is back in normal sinus rhythm and feels much better. She has a past history of tachybradycardia syndrome . She has a permanent pacemaker implanted 07/01/10 . She has a remote history of repair of a patent ductus arteriosus. She has a history of moderate valvular aortic stenosis. She has had a history of congestive heart failure. Most recent admission was 08/25/12-08/29/12 for exacerbation of CHF.  Prior to her going into the hospital she had been on Lasix only 3 times a week and she was sent out on daily Lasix 40 mg daily. Since last visit she has been doing well.  Her last echocardiogram 08/26/12 showed ejection fraction of 60-65% with grade 2 diastolic dysfunction and a peak aortic valve gradient of 29 with a mean aortic valve gradient of 14.  Current Outpatient Prescriptions  Medication Sig Dispense Refill  . amiodarone (PACERONE) 200 MG tablet Take 200 mg by mouth daily.       [name] atenolol (TENORMIN) 100 MG tablet TAKE 1 TABLET TWICE A DAY FOR BLOOD PRESSURE  180 tablet  3  . atorvastatin (LIPITOR) 40 MG tablet Take 20 mg by mouth See admin instructions. Takes 1 tablet Monday-Friday      . calcium-vitamin D (OSCAL WITH D) 500-200 MG-UNIT per tablet Take 1 tablet by mouth daily.      . cholecalciferol (VITAMIN D) 1000 UNITS tablet Take 5,000 Units by mouth every morning.      . famotidine (PEPCID) 10 MG tablet Take 10 mg by mouth daily as needed. For acid reducer      . FLORA-Q (FLORA-Q) CAPS capsule Take 1 capsule by mouth daily. Takes 2 caps daily       . furosemide (LASIX) 40 MG tablet Take 1 tablet (40 mg total) by mouth daily.  30 tablet  0  . levothyroxine (SYNTHROID, LEVOTHROID) 100 MCG tablet Take 50 mcg by mouth See admin instructions. Take 1/2 tab daily except for saturdays and sundays      . losartan-hydrochlorothiazide (HYZAAR) 100-25 MG per tablet Take by mouth as directed. 1/2 tablet daily      . Magnesium 250 MG TABS Take 1 tablet by mouth every Monday, Wednesday, and Friday.      . potassium chloride SA (K-DUR,KLOR-CON) 20 MEQ tablet Take 1 tablet (20 mEq total) by mouth 2 (two) times daily.  60 tablet  0  . pregabalin (LYRICA) 75 MG capsule Take 75 mg by mouth 2 (two) times daily.       . sertraline (ZOLOFT) 50 MG tablet Take 50 mg by mouth every evening.      . warfarin (COUMADIN) 2 MG tablet Take 1-2 mg by mouth as directed.        No current facility-administered medications for this visit.    Allergies  Allergen Reactions  . Codeine Nausea And Vomiting  . Doxazosin   . Requip [Ropinirole Hcl]     Headache   . Verapamil   . Zinc     nausea  . Zocor [  Simvastatin]     Patient Active Problem List   Diagnosis Date Noted  . Atrial fibrillation 12/02/2010    Priority: High  . HYPERTENSION 11/17/2007    Priority: Medium  . Routine general medical examination at a health care facility 09/24/2013  . Other abnormal glucose 09/24/2013  . Unspecified vitamin D deficiency 09/24/2013  . HTN (hypertension)   . Hyperlipemia   . Hypothyroidism   . Malaise and fatigue 09/14/2012  . Subtherapeutic international normalized ratio (INR) 08/26/2012  . Pacemaker 08/26/2012  . Acute respiratory failure with hypoxia 02/10/2012  . S/P total knee arthroplasty 02/10/2012  . S/P right TKA 02/07/2012  . Family history of malignant neoplasm of gastrointestinal tract 07/27/2011  . Benign neoplasm of stomach 07/27/2011  . Diarrhea 07/27/2011  . Osteoarthritis 07/09/2011  . Aortic stenosis, moderate 07/09/2011  . S/P repair of  patent ductus arteriosus 04/22/2011  . Tachycardia-bradycardia syndrome 12/24/2010  . RENAL CELL CANCER 11/17/2007  . HYPOTHYROIDISM 11/17/2007  . HYPERLIPIDEMIA 11/17/2007  . ANXIETY 11/17/2007  . DEPRESSION 11/17/2007  . RESTLESS LEG SYNDROME 11/17/2007  . Gastroparesis 11/17/2007  . RADIATION PROCTITIS 11/17/2007  . FIBROCYSTIC BREAST DISEASE 11/17/2007  . OSTEOMYELITIS 11/17/2007  . CARCINOMA, UTERUS, HX OF 11/17/2007  . RADIATION THERAPY, HX OF 11/17/2007    History  Smoking status  . Never Smoker   Smokeless tobacco  . Never Used    History  Alcohol Use No    Family History  Problem Relation Age of Onset  . Colon cancer Mother 55  . Heart disease Mother   . Heart disease Father   . Heart attack Father   . Heart disease Maternal Grandmother     Review of Systems: Constitutional: no fever chills diaphoresis or fatigue or change in weight.  Head and neck: no hearing loss, no epistaxis, no photophobia or visual disturbance. Respiratory: No cough, shortness of breath or wheezing. Cardiovascular: No chest pain peripheral edema, palpitations. Gastrointestinal: No abdominal distention, no abdominal pain, no change in bowel habits hematochezia or melena. Genitourinary: No dysuria, no frequency, no urgency, no nocturia. Musculoskeletal:No arthralgias, no back pain, no gait disturbance or myalgias. Neurological: No dizziness, no headaches, no numbness, no seizures, no syncope, no weakness, no tremors. Hematologic: No lymphadenopathy, no easy bruising. Psychiatric: No confusion, no hallucinations, no sleep disturbance.    Physical Exam: Filed Vitals:   10/09/13 1104  BP: 120/80  Pulse: 64   the general appearance reveals a well-developed elderly woman in no distress.  She is alert.  She still works full time.The head and neck exam reveals pupils equal and reactive.  Extraocular movements are full.  There is no scleral icterus.  The mouth and pharynx are normal.  The  neck is supple.  The carotids reveal no bruits.  The jugular venous pressure is normal.  The  thyroid is not enlarged.  There is no lymphadenopathy.  The chest is clear to percussion and auscultation.  There are no rales or rhonchi.  Expansion of the chest is symmetrical.  The precordium is quiet.  The first heart sound is normal.  The second heart sound is physiologically split.  There is grade 2/6 systolic ejection murmur at the base.  No diastolic murmur.  There is no abnormal lift or heave.  The abdomen is soft and nontender.  The bowel sounds are normal.  The liver and spleen are not enlarged.  There are no abdominal masses.  There are no abdominal bruits.  Extremities reveal weak pedal pulses.  There is no phlebitis or edema.  There is no cyanosis or clubbing.  Strength is normal and symmetrical in all extremities.  There is no lateralizing weakness.  There are no sensory deficits.  The skin is warm and dry.  There is no rash.  EKG shows atrial paced rhythm and nonspecific T-wave abnormality   Assessment / Plan:  She feels much better.  She feels back to normal.  We will continue with amiodarone 200 mg daily and at a reduced dose of losartan HCT one half tablet daily.  Recheck at her regularly scheduled appointment in April.

## 2013-10-09 NOTE — Assessment & Plan Note (Signed)
When she went into atrial fibrillation day before yesterday she was taking just 100 mg of amiodarone a day.  We will have her increase her dose back to a full 200 mg tablet daily.  EKG today confirms she is back in a paced atrial rhythm

## 2013-10-09 NOTE — Patient Instructions (Addendum)
INCREASE YOUR AMIODARONE TO A FULL TABLET EVERY MORNING  DECREASE YOUR LOSARTAN TO 1/2 TABLET DAILY  Return in April, I will call you next week with an appointment

## 2013-10-09 NOTE — Assessment & Plan Note (Signed)
Yesterday her blood pressure was low in the range of 90 systolic.  Today her blood pressure is back to 120/80 range.  Her losartan HCT is currently on hold.  We will resume it at a dose of one half tablet daily

## 2013-10-09 NOTE — Assessment & Plan Note (Signed)
Patient has not had any symptoms referable to her aortic stenosis

## 2013-10-19 ENCOUNTER — Other Ambulatory Visit: Payer: Self-pay | Admitting: Internal Medicine

## 2013-11-02 ENCOUNTER — Ambulatory Visit (INDEPENDENT_AMBULATORY_CARE_PROVIDER_SITE_OTHER): Payer: 59 | Admitting: Pharmacist

## 2013-11-02 DIAGNOSIS — I4891 Unspecified atrial fibrillation: Secondary | ICD-10-CM

## 2013-11-02 LAB — POCT INR: INR: 2.7

## 2013-11-07 ENCOUNTER — Ambulatory Visit (INDEPENDENT_AMBULATORY_CARE_PROVIDER_SITE_OTHER): Payer: 59 | Admitting: *Deleted

## 2013-11-07 DIAGNOSIS — I4891 Unspecified atrial fibrillation: Secondary | ICD-10-CM

## 2013-11-07 DIAGNOSIS — I495 Sick sinus syndrome: Secondary | ICD-10-CM

## 2013-11-08 ENCOUNTER — Encounter: Payer: Self-pay | Admitting: Internal Medicine

## 2013-11-08 DIAGNOSIS — I4891 Unspecified atrial fibrillation: Secondary | ICD-10-CM

## 2013-11-08 DIAGNOSIS — I495 Sick sinus syndrome: Secondary | ICD-10-CM

## 2013-11-10 ENCOUNTER — Other Ambulatory Visit: Payer: Self-pay | Admitting: Internal Medicine

## 2013-11-11 LAB — MDC_IDC_ENUM_SESS_TYPE_REMOTE
Battery Remaining Longevity: 111 mo
Brady Statistic AP VP Percent: 0 %
Brady Statistic AP VS Percent: 98 %
Brady Statistic AS VP Percent: 0 %
Brady Statistic AS VS Percent: 2 %
Date Time Interrogation Session: 20150305065914
Lead Channel Impedance Value: 415 Ohm
Lead Channel Impedance Value: 484 Ohm
Lead Channel Pacing Threshold Amplitude: 0.375 V
Lead Channel Pacing Threshold Amplitude: 0.75 V
Lead Channel Pacing Threshold Pulse Width: 0.4 ms
Lead Channel Pacing Threshold Pulse Width: 0.4 ms
Lead Channel Sensing Intrinsic Amplitude: 16 mV
Lead Channel Setting Pacing Amplitude: 2 V
Lead Channel Setting Pacing Amplitude: 2.5 V
Lead Channel Setting Pacing Pulse Width: 0.4 ms
MDC IDC MSMT BATTERY IMPEDANCE: 244 Ohm
MDC IDC MSMT BATTERY VOLTAGE: 2.81 V
MDC IDC SET LEADCHNL RV SENSING SENSITIVITY: 2.8 mV

## 2013-11-18 ENCOUNTER — Other Ambulatory Visit: Payer: Self-pay | Admitting: Internal Medicine

## 2013-11-19 ENCOUNTER — Other Ambulatory Visit: Payer: Self-pay | Admitting: Internal Medicine

## 2013-11-27 ENCOUNTER — Other Ambulatory Visit (HOSPITAL_COMMUNITY): Payer: Self-pay | Admitting: Physician Assistant

## 2013-11-27 DIAGNOSIS — Z96659 Presence of unspecified artificial knee joint: Secondary | ICD-10-CM

## 2013-11-27 DIAGNOSIS — M25561 Pain in right knee: Secondary | ICD-10-CM

## 2013-11-29 ENCOUNTER — Ambulatory Visit (HOSPITAL_COMMUNITY)
Admission: RE | Admit: 2013-11-29 | Discharge: 2013-11-29 | Disposition: A | Discharge status: Home / Self Care | Payer: 59 | Source: Ambulatory Visit | Attending: Physician Assistant | Admitting: Physician Assistant

## 2013-11-29 ENCOUNTER — Encounter (HOSPITAL_COMMUNITY)
Admission: RE | Admit: 2013-11-29 | Discharge: 2013-11-29 | Disposition: A | Discharge status: Home / Self Care | Payer: 59 | Source: Ambulatory Visit | Attending: Physician Assistant | Admitting: Physician Assistant

## 2013-11-29 DIAGNOSIS — Z96659 Presence of unspecified artificial knee joint: Secondary | ICD-10-CM | POA: Diagnosis not present

## 2013-11-29 DIAGNOSIS — M25569 Pain in unspecified knee: Secondary | ICD-10-CM | POA: Insufficient documentation

## 2013-11-29 DIAGNOSIS — M25561 Pain in right knee: Secondary | ICD-10-CM

## 2013-11-29 MED ORDER — TECHNETIUM TC 99M MEDRONATE IV KIT
25.0000 | PACK | Freq: Once | INTRAVENOUS | Status: AC | PRN
  Administered 2013-11-29: 25 via INTRAVENOUS

## 2013-12-05 ENCOUNTER — Encounter: Payer: Self-pay | Admitting: *Deleted

## 2013-12-11 ENCOUNTER — Ambulatory Visit: Payer: 59 | Admitting: Cardiology

## 2013-12-13 ENCOUNTER — Telehealth: Payer: Self-pay | Admitting: Cardiology

## 2013-12-13 ENCOUNTER — Other Ambulatory Visit (HOSPITAL_COMMUNITY): Payer: Self-pay | Admitting: Orthopedic Surgery

## 2013-12-13 ENCOUNTER — Encounter (HOSPITAL_COMMUNITY): Payer: Self-pay | Admitting: Pharmacy Technician

## 2013-12-13 NOTE — Progress Notes (Signed)
10-09-13 LOV DR BRACKBIL  EPIC LAST DEVICE CHECK NOTE 11-11-13 EPIC EKG 2-3-1 5EPIC

## 2013-12-13 NOTE — Telephone Encounter (Signed)
Left message to call back  

## 2013-12-13 NOTE — Patient Instructions (Addendum)
[place]  12/13/2013   Your procedure is scheduled on: Monday April 13th, 2015  Report to [place] at  220 pm.  Call this number if you have problems the morning of surgery [phone]   Remember:  Do not eat food:After Midnight Sunday night, clear liquids midnight until 1050 am day of surgery, then nothing by mouth.     Take these medicines the morning of surgery with A SIP OF WATER: atenolol, amiodarone, pepcid, levothyroxine                               You may not have any metal on your body including hair pins and piercings  Do not wear jewelry, make-up, lotions, powders, or deodorant.   Men may shave face and neck.  Do not bring valuables to the hospital. [place].  Contacts, dentures or bridgework may not be worn into surgery.  Leave suitcase in the car. After surgery it may be brought to your room.  For patients admitted to the hospital, checkout time is 11:00 AM the day of discharge.   Patients discharged the day of surgery will not be allowed to drive home.  Name and phone number of your driver:  Special Instructions: N/A   - Preparing for Surgery Before surgery, you can play an important role.  Because skin is not sterile, your skin needs to be as free of germs as possible.  You can reduce the number of germs on your skin by washing with CHG (chlorahexidine gluconate) soap before surgery.  CHG is an antiseptic cleaner which kills germs and bonds with the skin to continue killing germs even after washing. Please DO NOT use if you have an allergy to CHG or antibacterial soaps.  If your skin becomes reddened/irritated stop using the CHG and inform your nurse when you arrive at Short Stay. Do not shave (including legs and underarms) for at least 48 hours prior to the first CHG shower.  You may shave your face. Please follow these instructions carefully:  1.  Shower with CHG Soap the night before  surgery and the  morning of Surgery.  2.  If you choose to wash your hair, wash your hair first as usual with your  normal  shampoo.  3.  After you shampoo, rinse your hair and body thoroughly to remove the  shampoo.                           4.  Use CHG as you would any other liquid soap.  You can apply chg directly  to the skin and wash                       Gently with a scrungie or clean washcloth.  5.  Apply the CHG Soap to your body ONLY FROM THE NECK DOWN.   Do not use on open                           Wound or open sores. Avoid contact with eyes, ears mouth and genitals (private parts).                        Genitals (private parts) with your normal soap.  6.  Wash thoroughly, paying special attention to the area where your surgery  will be performed.  7.  Thoroughly rinse your body with warm water from the neck down.  8.  DO NOT shower/wash with your normal soap after using and rinsing off  the CHG Soap.                9.  Pat yourself dry with a clean towel.            10.  Wear clean pajamas.            11.  Place clean sheets on your bed the night of your first shower and do not  sleep with pets. Day of Surgery : Do not apply any lotions/deodorants the morning of surgery.  Please wear clean clothes to the hospital/surgery center.  FAILURE TO FOLLOW THESE INSTRUCTIONS MAY RESULT IN THE CANCELLATION OF YOUR SURGERY PATIENT SIGNATURE_________________________________  NURSE SIGNATURE__________________________________    CLEAR LIQUID DIET   Foods Allowed                                                                     Foods Excluded  Coffee and tea, regular and decaf                             liquids that you cannot  Plain Jell-O in any flavor                                             see through such as: Fruit ices (not with fruit pulp)                                     milk, soups, orange juice  Iced Popsicles                                    All solid  food Carbonated beverages, regular and diet                                    Cranberry, grape and apple juices Sports drinks like Gatorade Lightly seasoned clear broth or consume(fat free) Sugar, honey syrup  Sample Menu Breakfast                                Lunch                                     Supper Cranberry juice                    Beef broth  Chicken broth Jell-O                                     Grape juice                           Apple juice Coffee or tea                        Jell-O                                      Popsicle                                                Coffee or tea                        Coffee or tea Incentive Spirometer  An incentive spirometer is a tool that can help keep your lungs clear and active. This tool measures how well you are filling your lungs with each breath. Taking long deep breaths may help reverse or decrease the chance of developing breathing (pulmonary) problems (especially infection) following:  A long period of time when you are unable to move or be active. BEFORE THE PROCEDURE   If the spirometer includes an indicator to show your best effort, your nurse or respiratory therapist will set it to a desired goal.  If possible, sit up straight or lean slightly forward. Try not to slouch.  Hold the incentive spirometer in an upright position. INSTRUCTIONS FOR USE  1. Sit on the edge of your bed if possible, or sit up as far as you can in bed or on a chair. 2. Hold the incentive spirometer in an upright position. 3. Breathe out normally. 4. Place the mouthpiece in your mouth and seal your lips tightly around it. 5. Breathe in slowly and as deeply as possible, raising the piston or the ball toward the top of the column. 6. Hold your breath for 3-5 seconds or for as long as possible. Allow the piston or ball to fall to the bottom of the column. 7. Remove the mouthpiece from your mouth and breathe  out normally. 8. Rest for a few seconds and repeat Steps 1 through 7 at least 10 times every 1-2 hours when you are awake. Take your time and take a few normal breaths between deep breaths. 9. The spirometer may include an indicator to show your best effort. Use the indicator as a goal to work toward during each repetition. 10. After each set of 10 deep breaths, practice coughing to be sure your lungs are clear. If you have an incision (the cut made at the time of surgery), support your incision when coughing by placing a pillow or rolled up towels firmly against it. Once you are able to get out of bed, walk around indoors and cough well. You may stop using the incentive spirometer when instructed by your caregiver.  RISKS AND COMPLICATIONS  Take your time so you do not get dizzy or light-headed.  If you are in pain, you may need to take or  ask for pain medication before doing incentive spirometry. It is harder to take a deep breath if you are having pain. AFTER USE  Rest and breathe slowly and easily.  It can be helpful to keep track of a log of your progress. Your caregiver can provide you with a simple table to help with this. If you are using the spirometer at home, follow these instructions: [place] IF:   You are having difficultly using the spirometer.  You have trouble using the spirometer as often as instructed.  Your pain medication is not giving enough relief while using the spirometer.  You develop fever of 100.5 F (38.1 C) or higher. SEEK IMMEDIATE MEDICAL CARE IF:   You cough up bloody sputum that had not been present before.  You develop fever of 102 F (38.9 C) or greater.  You develop worsening pain at or near the incision site. MAKE SURE YOU:   Understand these instructions.  Will watch your condition.  Will get help right away if you are not doing well or get worse. Document Released: 01/03/2007 Document Revised: 11/15/2011 Document Reviewed:  03/06/2007 [facility] Patient Information 2014 [place], [place].

## 2013-12-13 NOTE — Telephone Encounter (Signed)
New message     Need to come off warfarin due to upcoming procedure on  4/13.     Dr. Ann Maki orthopedic infection in knee replacement area.

## 2013-12-13 NOTE — Telephone Encounter (Signed)
Will forward to  Dr. Brackbill for review 

## 2013-12-13 NOTE — Telephone Encounter (Signed)
Spoke with Judeen Hammans at Dr Aurea Graff office. Patient there and information given to [facility] and faxed to office

## 2013-12-13 NOTE — Telephone Encounter (Signed)
Take last dose of warfarin today then hold it until after surgery on Monday.

## 2013-12-14 ENCOUNTER — Encounter (HOSPITAL_COMMUNITY): Payer: Self-pay

## 2013-12-14 ENCOUNTER — Ambulatory Visit (HOSPITAL_COMMUNITY)
Admission: RE | Admit: 2013-12-14 | Discharge: 2013-12-14 | Disposition: A | Discharge status: Home / Self Care | Payer: 59 | Source: Ambulatory Visit | Attending: Anesthesiology | Admitting: Anesthesiology

## 2013-12-14 ENCOUNTER — Encounter (HOSPITAL_COMMUNITY)
Admission: RE | Admit: 2013-12-14 | Discharge: 2013-12-14 | Disposition: A | Discharge status: Home / Self Care | Payer: 59 | Source: Ambulatory Visit | Attending: Orthopedic Surgery | Admitting: Orthopedic Surgery

## 2013-12-14 DIAGNOSIS — Z01812 Encounter for preprocedural laboratory examination: Secondary | ICD-10-CM | POA: Diagnosis not present

## 2013-12-14 DIAGNOSIS — I700 Atherosclerosis of aorta: Secondary | ICD-10-CM | POA: Diagnosis not present

## 2013-12-14 DIAGNOSIS — Z01818 Encounter for other preprocedural examination: Secondary | ICD-10-CM | POA: Diagnosis present

## 2013-12-14 DIAGNOSIS — Z950 Presence of cardiac pacemaker: Secondary | ICD-10-CM | POA: Diagnosis not present

## 2013-12-14 HISTORY — DX: Unspecified nephritic syndrome with unspecified morphologic changes: N05.9

## 2013-12-14 HISTORY — DX: Osteomyelitis, unspecified: M86.9

## 2013-12-14 LAB — CBC
HCT: 43.3 % (ref 36.0–46.0)
Hemoglobin: 14.6 g/dL (ref 12.0–15.0)
MCH: 30.8 pg (ref 26.0–34.0)
MCHC: 33.7 g/dL (ref 30.0–36.0)
MCV: 91.4 fL (ref 78.0–100.0)
PLATELETS: 259 10*3/uL (ref 150–400)
RBC: 4.74 MIL/uL (ref 3.87–5.11)
RDW: 14.1 % (ref 11.5–15.5)
WBC: 9.9 10*3/uL (ref 4.0–10.5)

## 2013-12-14 LAB — URINALYSIS, ROUTINE W REFLEX MICROSCOPIC
Bilirubin Urine: NEGATIVE
Glucose, UA: NEGATIVE mg/dL
Hgb urine dipstick: NEGATIVE
KETONES UR: NEGATIVE mg/dL
LEUKOCYTES UA: NEGATIVE
Nitrite: NEGATIVE
PROTEIN: NEGATIVE mg/dL
Specific Gravity, Urine: 1.007 (ref 1.005–1.030)
UROBILINOGEN UA: 0.2 mg/dL (ref 0.0–1.0)
pH: 6.5 (ref 5.0–8.0)

## 2013-12-14 LAB — NO BLOOD PRODUCTS

## 2013-12-14 LAB — PROTIME-INR
INR: 1.43 (ref 0.00–1.49)
PROTHROMBIN TIME: 17.1 s — AB (ref 11.6–15.2)

## 2013-12-14 LAB — BASIC METABOLIC PANEL
BUN: 30 mg/dL — ABNORMAL HIGH (ref 6–23)
CALCIUM: 9 mg/dL (ref 8.4–10.5)
CO2: 28 meq/L (ref 19–32)
Chloride: 104 mEq/L (ref 96–112)
Creatinine, Ser: 1.33 mg/dL — ABNORMAL HIGH (ref 0.50–1.10)
GFR, EST AFRICAN AMERICAN: 43 mL/min — AB (ref >90)
GFR, EST NON AFRICAN AMERICAN: 37 mL/min — AB (ref >90)
Glucose, Bld: 65 mg/dL — ABNORMAL LOW (ref 70–99)
Potassium: 3.9 mEq/L (ref 3.7–5.3)
SODIUM: 144 meq/L (ref 137–147)

## 2013-12-14 LAB — SURGICAL PCR SCREEN
MRSA, PCR: NEGATIVE
Staphylococcus aureus: NEGATIVE

## 2013-12-14 LAB — APTT: aPTT: 32 seconds (ref 24–37)

## 2013-12-14 NOTE — Progress Notes (Signed)
Pacer orders received from dr allred and placed on chart, rep does not need to come per dr allred pacer orders

## 2013-12-14 NOTE — Progress Notes (Signed)
bmet results faxed by epic to dr olin 

## 2013-12-14 NOTE — Telephone Encounter (Signed)
Received request from Nurse fax box, [facility] faxed document for surgical clearance. To: [place] Fax number: [phone] Attention:Sherry Eugenie Birks

## 2013-12-14 NOTE — Progress Notes (Signed)
Blood refusal form faxed to dr Alvan Dame and [facility], fax confirmation received and placed on chart

## 2013-12-15 NOTE — H&P (Signed)
[name] is an 80 y.o. female.    Procedure: I&D of the right knee with poly exchange  Chief Complaint:    Right knee pain s/p TKA  HPI: Pt is a 80 y.o. female complaining of right pain.  [name] comes in for evaluation of her right total knee.  She has a previous right total knee arthroplasty on 02/07/2012 by Dr. Alvan Dame. On 3/21 she and her daughter were at [facility] shopping when she had a sharp pain come on in her knee. It continued to bother her and caused her pain and discomfort. It hurt when she put weight on it. They got home. She elevated it, iced it, and started using crutches. She went to [place] the next day where she was seen and evaluated. X-rays were obtained and revealed no acute bony abnormalities. They did a CBC on her which showed an elevated white count of 16.4. with a slightly high granular count at 80.8, otherwise normal limits. She was placed on Cipro and told to follow up with [place] for further evaluation. Note that she is on Coumadin for A-fib. She does bruise easily, but she had no injury to her knee that she is aware of. She has been running an occasional low grade fever, but no chills, fevers, or constitutional symptoms. The knee has been a little bit warm since this occurred.Risks, benefits and expectations were discussed with the patient.  Risks including but not limited to the risk of anesthesia, blood clots, nerve damage, blood vessel damage, failure of the prosthesis, infection and up to and including death.  Patient understand the risks, benefits and expectations and wishes to proceed with surgery.   PCP:  [name], MD  D/C Plans:  Home with HHPT - daughter to care for at home  Post-op Meds:    No Rx given   Tranexamic Acid:   Not to be given - CAD  Decadron:  To be given  PMH: Past Medical History  Diagnosis Date  . PAF (paroxysmal atrial fibrillation)     controlled with amiodarone, on coumadin  . CAD (coronary artery disease)   .  Aortic root dilatation   . Moderate aortic stenosis   . Chronic anticoagulation   . Tachycardia-bradycardia syndrome     s/p PPM by Dr Doreatha Lew ([facility]) 07/03/10  . GERD (gastroesophageal reflux disease)   . Chronic kidney disease   . Pacemaker     2012  . History of nephrectomy     left - due to kidney cancer  . Neuropathy     feet/legs  . Restless leg syndrome   . Headache(784.0)     hx migraines - takes Zoloft for migraines  . History of uterine cancer   . Cancer     hx Kidney cancer / hx of endometrial cancer  . PONV (postoperative nausea and vomiting)   . Anxiety   . Heart murmur   . HTN (hypertension)   . Hyperlipemia   . Hypothyroidism   . 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 Osteomyelitis as child  . Family history of anesthesia complication     daughter has ponv    PSH: Past Surgical History  Procedure Laterality Date  . Cardiac catheterization  2008  . Nephrectomy    . Total abdominal hysterectomy    . Patent ductus arterious repair  age 76  . Ptvp    . Pacemaker insertion  07/04/11    by Dr Doreatha Lew  . Cholecystectomy    .  Upper gastrointestinal endoscopy    . Total knee arthroplasty  02/07/2012    Procedure: TOTAL KNEE ARTHROPLASTY;  Surgeon: Mauri Pole, MD;  Location: [facility];  Service: Orthopedics;  Laterality: Right;  . Insert / replace / remove pacemaker    . Joint replacement      rt total hip 2009    Social History:  reports that she has never smoked. She has never used smokeless tobacco. She reports that she does not drink alcohol or use illicit drugs.  Allergies:  Allergies  Allergen Reactions  . Codeine Nausea And Vomiting  . Morphine And Related Nausea And Vomiting  . Requip [Ropinirole Hcl]     Headache   . Zinc     nausea    Medications: No current facility-administered medications for this encounter.   Current Outpatient Prescriptions  Medication Sig Dispense Refill  . acetaminophen (TYLENOL) 325 MG tablet Take 650 mg by mouth every 6  (six) hours as needed (Pain).      [name] amiodarone (PACERONE) 200 MG tablet Take 200 mg by mouth every morning.       [name] atenolol (TENORMIN) 100 MG tablet Take 100 mg by mouth 2 (two) times daily.      [name] atorvastatin (LIPITOR) 40 MG tablet Take 20 mg by mouth See admin instructions. Takes 0.5 tablet Monday-Friday      . Biotin 1000 MCG tablet Take 1,000 mcg by mouth every morning.      . cholecalciferol (VITAMIN D) 1000 UNITS tablet Take 2,000 Units by mouth every morning.       . famotidine (PEPCID) 10 MG tablet Take 10 mg by mouth every morning. For acid reducer      . FLORA-Q (FLORA-Q) CAPS capsule Take 1 capsule by mouth 2 (two) times daily.       . furosemide (LASIX) 40 MG tablet Take 1 tablet (40 mg total) by mouth daily.  30 tablet  0  . levothyroxine (SYNTHROID, LEVOTHROID) 100 MCG tablet Take 50 mcg by mouth daily.       [name] losartan-hydrochlorothiazide (HYZAAR) 100-25 MG per tablet Take 0.5 tablets by mouth every morning.      . Magnesium 250 MG TABS Take 1 tablet by mouth every Monday, Wednesday, and Friday.      . nitroGLYCERIN (NITROSTAT) 0.4 MG SL tablet Place 0.4 mg under the tongue every 5 (five) minutes as needed for chest pain.      . potassium chloride SA (K-DUR,KLOR-CON) 20 MEQ tablet Take 1 tablet (20 mEq total) by mouth 2 (two) times daily.  60 tablet  0  . pregabalin (LYRICA) 75 MG capsule Take 75 mg by mouth 2 (two) times daily.      . sertraline (ZOLOFT) 50 MG tablet Take 50 mg by mouth every evening.      . warfarin (COUMADIN) 2 MG tablet Take 1-2 mg by mouth daily. Wednesday and Friday takes 36m. Takes 2 mg all other days        Results for orders placed during the hospital encounter of 12/14/13 (from the past 48 hour(s))  SURGICAL PCR SCREEN     Status: None   Collection Time    12/14/13 10:20 AM      Result Value Ref Range   MRSA, PCR NEGATIVE  NEGATIVE   Staphylococcus aureus NEGATIVE  NEGATIVE   Comment:            The Xpert SA Assay (FDA     approved for NASAL  specimens  in patients over 54 years of age),     is one component of     a comprehensive surveillance     program.  Test performance has     been validated by [name] for patients greater     than or equal to 8 year old.     It is not intended     to diagnose infection nor to     guide or monitor treatment.  URINALYSIS, ROUTINE W REFLEX MICROSCOPIC     Status: None   Collection Time    12/14/13 10:40 AM      Result Value Ref Range   Color, Urine YELLOW  YELLOW   APPearance CLEAR  CLEAR   Specific Gravity, Urine 1.007  1.005 - 1.030   pH 6.5  5.0 - 8.0   Glucose, UA NEGATIVE  NEGATIVE mg/dL   Hgb urine dipstick NEGATIVE  NEGATIVE   Bilirubin Urine NEGATIVE  NEGATIVE   Ketones, ur NEGATIVE  NEGATIVE mg/dL   Protein, ur NEGATIVE  NEGATIVE mg/dL   Urobilinogen, UA 0.2  0.0 - 1.0 mg/dL   Nitrite NEGATIVE  NEGATIVE   Leukocytes, UA NEGATIVE  NEGATIVE   Comment: MICROSCOPIC NOT DONE ON URINES WITH NEGATIVE PROTEIN, BLOOD, LEUKOCYTES, NITRITE, OR GLUCOSE <1000 mg/dL.  APTT     Status: None   Collection Time    12/14/13 10:45 AM      Result Value Ref Range   aPTT 32  24 - 37 seconds  BASIC METABOLIC PANEL     Status: Abnormal   Collection Time    12/14/13 10:45 AM      Result Value Ref Range   Sodium 144  137 - 147 mEq/L   Potassium 3.9  3.7 - 5.3 mEq/L   Chloride 104  96 - 112 mEq/L   CO2 28  19 - 32 mEq/L   Glucose, Bld 65 (*) 70 - 99 mg/dL   BUN 30 (*) 6 - 23 mg/dL   Creatinine, Ser 1.33 (*) 0.50 - 1.10 mg/dL   Calcium 9.0  8.4 - 10.5 mg/dL   GFR calc non Af Amer 37 (*) >90 mL/min   GFR calc Af Amer 43 (*) >90 mL/min   Comment: (NOTE)     The eGFR has been calculated using the CKD EPI equation.     This calculation has not been validated in all clinical situations.     eGFR's persistently <90 mL/min signify possible Chronic Kidney     Disease.  CBC     Status: None   Collection Time    12/14/13 10:45 AM      Result Value Ref Range   WBC 9.9  4.0 - 10.5  K/uL   RBC 4.74  3.87 - 5.11 MIL/uL   Hemoglobin 14.6  12.0 - 15.0 g/dL   HCT 43.3  36.0 - 46.0 %   MCV 91.4  78.0 - 100.0 fL   MCH 30.8  26.0 - 34.0 pg   MCHC 33.7  30.0 - 36.0 g/dL   RDW 14.1  11.5 - 15.5 %   Platelets 259  150 - 400 K/uL  PROTIME-INR     Status: Abnormal   Collection Time    12/14/13 10:45 AM      Result Value Ref Range   Prothrombin Time 17.1 (*) 11.6 - 15.2 seconds   INR 1.43  0.00 - 1.49  NO BLOOD PRODUCTS     Status: None  Collection Time    12/14/13 11:45 AM      Result Value Ref Range   Transfuse no blood products       Value: TRANSFUSE NO BLOOD PRODUCTS, VERIFIED BY Marylouise Stacks, RN   Dg Chest 2 View  12/14/2013   CLINICAL DATA:  Preop right knee arthroplasty incision and drainage.  EXAM: CHEST  2 VIEW  COMPARISON:  08/25/2012  FINDINGS: Right-sided dual lead pacemaker is unchanged in appearance. Cardiac silhouette is within normal limits. Thoracic aorta is again seen to be calcified and mildly tortuous. Elevation of the right hemidiaphragm is unchanged. Minimal linear opacity in the left lung base is unchanged, likely scarring or atelectasis. No pleural effusion or pneumothorax is identified. No acute osseous abnormality is seen.  IMPRESSION: No active cardiopulmonary disease.   Electronically Signed   By: Logan Bores   On: 12/14/2013 12:34     Review of Systems  Constitutional: Negative.   Eyes: Negative.   Respiratory: Negative.   Cardiovascular: Negative.   Gastrointestinal: Positive for heartburn.  Genitourinary: Negative.   Musculoskeletal: Negative.   Skin: Negative.   Neurological: Positive for headaches.  Endo/Heme/Allergies: Negative.   Psychiatric/Behavioral: The patient is nervous/anxious.      Physical Exam  Constitutional: She appears well-developed and well-nourished.  HENT:  Head: Normocephalic and atraumatic.  Mouth/Throat: Oropharynx is clear and moist.  Eyes: Pupils are equal, round, and reactive to light.  Neck: Neck  supple. No JVD present. No tracheal deviation present. No thyromegaly present.  Cardiovascular: Normal rate, regular rhythm and intact distal pulses.   Murmur heard. Respiratory: Effort normal and breath sounds normal. No respiratory distress. She has no wheezes.  GI: Soft. There is no tenderness. There is no guarding.  Musculoskeletal:       Right knee: She exhibits decreased range of motion, laceration (healed) and bony tenderness. She exhibits no effusion, no ecchymosis, no deformity and no erythema. Tenderness found.  Lymphadenopathy:    She has no cervical adenopathy.  Neurological: She is alert.  Skin: Skin is warm and dry.  Psychiatric: She has a normal mood and affect.     Assessment/Plan Assessment:     Right knee pain s/p TKA   Plan: Patient will undergo a I&D of the right knee with poly exchange on 12/17/2013 per Dr. Alvan Dame at [facility]. Risks benefits and expectations were discussed with the patient. Patient understand risks, benefits and expectations and wishes to proceed.   [name]   PAC  12/15/2013, 10:21 PM

## 2013-12-17 ENCOUNTER — Encounter (HOSPITAL_COMMUNITY): Payer: 59 | Admitting: Anesthesiology

## 2013-12-17 ENCOUNTER — Inpatient Hospital Stay (HOSPITAL_COMMUNITY)
Admission: RE | Admit: 2013-12-17 | Discharge: 2013-12-19 | DRG: 465 | Disposition: A | Discharge status: Home Health | Payer: 59 | Source: Ambulatory Visit | Attending: Orthopedic Surgery | Admitting: Orthopedic Surgery

## 2013-12-17 ENCOUNTER — Inpatient Hospital Stay (HOSPITAL_COMMUNITY): Payer: 59 | Admitting: Anesthesiology

## 2013-12-17 ENCOUNTER — Encounter (HOSPITAL_COMMUNITY): Payer: Self-pay | Admitting: *Deleted

## 2013-12-17 ENCOUNTER — Encounter (HOSPITAL_COMMUNITY)
Admission: RE | Disposition: A | Discharge status: Home Health | Payer: Self-pay | Source: Ambulatory Visit | Attending: Orthopedic Surgery

## 2013-12-17 DIAGNOSIS — E039 Hypothyroidism, unspecified: Secondary | ICD-10-CM | POA: Diagnosis present

## 2013-12-17 DIAGNOSIS — Z96659 Presence of unspecified artificial knee joint: Secondary | ICD-10-CM

## 2013-12-17 DIAGNOSIS — Z950 Presence of cardiac pacemaker: Secondary | ICD-10-CM

## 2013-12-17 DIAGNOSIS — Z7901 Long term (current) use of anticoagulants: Secondary | ICD-10-CM

## 2013-12-17 DIAGNOSIS — G2581 Restless legs syndrome: Secondary | ICD-10-CM | POA: Diagnosis present

## 2013-12-17 DIAGNOSIS — E785 Hyperlipidemia, unspecified: Secondary | ICD-10-CM | POA: Diagnosis present

## 2013-12-17 DIAGNOSIS — Z6825 Body mass index (BMI) 25.0-25.9, adult: Secondary | ICD-10-CM

## 2013-12-17 DIAGNOSIS — Y831 Surgical operation with implant of artificial internal device as the cause of abnormal reaction of the patient, or of later complication, without mention of misadventure at the time of the procedure: Secondary | ICD-10-CM | POA: Diagnosis present

## 2013-12-17 DIAGNOSIS — I129 Hypertensive chronic kidney disease with stage 1 through stage 4 chronic kidney disease, or unspecified chronic kidney disease: Secondary | ICD-10-CM | POA: Diagnosis present

## 2013-12-17 DIAGNOSIS — Z79899 Other long term (current) drug therapy: Secondary | ICD-10-CM

## 2013-12-17 DIAGNOSIS — T8450XA Infection and inflammatory reaction due to unspecified internal joint prosthesis, initial encounter: Principal | ICD-10-CM | POA: Diagnosis present

## 2013-12-17 DIAGNOSIS — K219 Gastro-esophageal reflux disease without esophagitis: Secondary | ICD-10-CM | POA: Diagnosis present

## 2013-12-17 DIAGNOSIS — I4891 Unspecified atrial fibrillation: Secondary | ICD-10-CM | POA: Diagnosis present

## 2013-12-17 DIAGNOSIS — N189 Chronic kidney disease, unspecified: Secondary | ICD-10-CM | POA: Diagnosis present

## 2013-12-17 DIAGNOSIS — Z85528 Personal history of other malignant neoplasm of kidney: Secondary | ICD-10-CM

## 2013-12-17 DIAGNOSIS — G589 Mononeuropathy, unspecified: Secondary | ICD-10-CM | POA: Diagnosis present

## 2013-12-17 DIAGNOSIS — I359 Nonrheumatic aortic valve disorder, unspecified: Secondary | ICD-10-CM | POA: Diagnosis present

## 2013-12-17 DIAGNOSIS — I2510 Atherosclerotic heart disease of native coronary artery without angina pectoris: Secondary | ICD-10-CM | POA: Diagnosis present

## 2013-12-17 DIAGNOSIS — T8459XA Infection and inflammatory reaction due to other internal joint prosthesis, initial encounter: Secondary | ICD-10-CM

## 2013-12-17 DIAGNOSIS — Z905 Acquired absence of kidney: Secondary | ICD-10-CM

## 2013-12-17 DIAGNOSIS — E663 Overweight: Secondary | ICD-10-CM | POA: Diagnosis present

## 2013-12-17 DIAGNOSIS — Z96649 Presence of unspecified artificial hip joint: Secondary | ICD-10-CM

## 2013-12-17 DIAGNOSIS — Z8542 Personal history of malignant neoplasm of other parts of uterus: Secondary | ICD-10-CM

## 2013-12-17 HISTORY — PX: I&D KNEE WITH POLY EXCHANGE: SHX5024

## 2013-12-17 LAB — CREATININE, SERUM
CREATININE: 1.11 mg/dL — AB (ref 0.50–1.10)
GFR calc Af Amer: 54 mL/min — ABNORMAL LOW (ref >90)
GFR, EST NON AFRICAN AMERICAN: 47 mL/min — AB (ref >90)

## 2013-12-17 LAB — GLUCOSE, CAPILLARY: Glucose-Capillary: 81 mg/dL (ref 70–99)

## 2013-12-17 LAB — PROTIME-INR
INR: 0.96 (ref 0.00–1.49)
Prothrombin Time: 12.6 seconds (ref 11.6–15.2)

## 2013-12-17 SURGERY — IRRIGATION AND DEBRIDEMENT KNEE WITH POLY EXCHANGE
Anesthesia: General | Site: Knee | Laterality: Right

## 2013-12-17 MED ORDER — BUPIVACAINE-EPINEPHRINE PF 0.25-1:200000 % IJ SOLN
INTRAMUSCULAR | Status: AC
  Filled 2013-12-17: qty 30

## 2013-12-17 MED ORDER — PROPOFOL 10 MG/ML IV BOLUS
INTRAVENOUS | Status: DC | PRN
  Administered 2013-12-17: 100 mg via INTRAVENOUS

## 2013-12-17 MED ORDER — ONDANSETRON HCL 4 MG/2ML IJ SOLN: 4.0000 mg | Freq: Four times a day (QID) | INTRAMUSCULAR | Status: DC | PRN

## 2013-12-17 MED ORDER — SUCCINYLCHOLINE CHLORIDE 20 MG/ML IJ SOLN
INTRAMUSCULAR | Status: DC | PRN
  Administered 2013-12-17: 80 mg via INTRAVENOUS

## 2013-12-17 MED ORDER — DEXAMETHASONE SODIUM PHOSPHATE 10 MG/ML IJ SOLN: 10.0000 mg | Freq: Once | INTRAMUSCULAR | Status: DC

## 2013-12-17 MED ORDER — DEXAMETHASONE SODIUM PHOSPHATE 10 MG/ML IJ SOLN
INTRAMUSCULAR | Status: DC | PRN
  Administered 2013-12-17: 10 mg via INTRAVENOUS

## 2013-12-17 MED ORDER — LACTATED RINGERS IV SOLN
INTRAVENOUS | Status: AC
  Administered 2013-12-17: 19:00:00 via INTRAVENOUS
  Administered 2013-12-17: 1000 mL via INTRAVENOUS

## 2013-12-17 MED ORDER — VANCOMYCIN HCL 10 G IV SOLR
1500.0000 mg | Freq: Once | INTRAVENOUS | Status: AC
  Administered 2013-12-17: 1500 mg via INTRAVENOUS
  Filled 2013-12-17: qty 1500

## 2013-12-17 MED ORDER — DEXAMETHASONE SODIUM PHOSPHATE 10 MG/ML IJ SOLN
INTRAMUSCULAR | Status: AC
  Filled 2013-12-17: qty 1

## 2013-12-17 MED ORDER — ATORVASTATIN CALCIUM 20 MG PO TABS
20.0000 mg | ORAL_TABLET | Freq: Once | ORAL | Status: DC
  Filled 2013-12-17: qty 1

## 2013-12-17 MED ORDER — FENTANYL CITRATE 0.05 MG/ML IJ SOLN
INTRAMUSCULAR | Status: AC
  Filled 2013-12-17: qty 2

## 2013-12-17 MED ORDER — HYDROCHLOROTHIAZIDE 12.5 MG PO CAPS
12.5000 mg | ORAL_CAPSULE | Freq: Every day | ORAL | Status: DC
  Administered 2013-12-18 – 2013-12-19 (×2): 12.5 mg via ORAL
  Filled 2013-12-17 (×2): qty 1

## 2013-12-17 MED ORDER — PHENYLEPHRINE HCL 10 MG/ML IJ SOLN
10.0000 mg | INTRAVENOUS | Status: DC | PRN
  Administered 2013-12-17: 50 ug/min via INTRAVENOUS

## 2013-12-17 MED ORDER — PHENOL 1.4 % MT LIQD: 1.0000 | OROMUCOSAL | Status: DC | PRN

## 2013-12-17 MED ORDER — POTASSIUM CHLORIDE CRYS ER 20 MEQ PO TBCR
20.0000 meq | EXTENDED_RELEASE_TABLET | Freq: Two times a day (BID) | ORAL | Status: DC
  Administered 2013-12-18 – 2013-12-19 (×3): 20 meq via ORAL
  Filled 2013-12-17 (×5): qty 1

## 2013-12-17 MED ORDER — WARFARIN - PHARMACIST DOSING INPATIENT: Freq: Every day | Status: DC

## 2013-12-17 MED ORDER — LOSARTAN POTASSIUM 50 MG PO TABS
50.0000 mg | ORAL_TABLET | Freq: Every day | ORAL | Status: DC
  Administered 2013-12-18 – 2013-12-19 (×2): 50 mg via ORAL
  Filled 2013-12-17 (×2): qty 1

## 2013-12-17 MED ORDER — METOCLOPRAMIDE HCL 5 MG/ML IJ SOLN: 5.0000 mg | Freq: Three times a day (TID) | INTRAMUSCULAR | Status: DC | PRN

## 2013-12-17 MED ORDER — DOCUSATE SODIUM 100 MG PO CAPS
100.0000 mg | ORAL_CAPSULE | Freq: Two times a day (BID) | ORAL | Status: DC
  Administered 2013-12-17: 100 mg via ORAL

## 2013-12-17 MED ORDER — LEVOTHYROXINE SODIUM 50 MCG PO TABS
50.0000 ug | ORAL_TABLET | Freq: Every day | ORAL | Status: DC
  Administered 2013-12-18 – 2013-12-19 (×2): 50 ug via ORAL
  Filled 2013-12-17 (×3): qty 1

## 2013-12-17 MED ORDER — METOCLOPRAMIDE HCL 10 MG PO TABS: 5.0000 mg | ORAL_TABLET | Freq: Three times a day (TID) | ORAL | Status: DC | PRN

## 2013-12-17 MED ORDER — FERROUS SULFATE 325 (65 FE) MG PO TABS
325.0000 mg | ORAL_TABLET | Freq: Three times a day (TID) | ORAL | Status: DC
  Administered 2013-12-18 – 2013-12-19 (×6): 325 mg via ORAL
  Filled 2013-12-17 (×7): qty 1

## 2013-12-17 MED ORDER — BISACODYL 10 MG RE SUPP: 10.0000 mg | Freq: Every day | RECTAL | Status: DC | PRN

## 2013-12-17 MED ORDER — POLYETHYLENE GLYCOL 3350 17 G PO PACK: 17.0000 g | PACK | Freq: Two times a day (BID) | ORAL | Status: DC

## 2013-12-17 MED ORDER — PROPOFOL 10 MG/ML IV BOLUS
INTRAVENOUS | Status: AC
  Filled 2013-12-17: qty 20

## 2013-12-17 MED ORDER — HYDROCODONE-ACETAMINOPHEN 7.5-325 MG PO TABS
1.0000 | ORAL_TABLET | ORAL | Status: DC
  Administered 2013-12-17 – 2013-12-19 (×3): 1 via ORAL
  Filled 2013-12-17 (×4): qty 1

## 2013-12-17 MED ORDER — SODIUM CHLORIDE 0.9 % IV SOLN
INTRAVENOUS | Status: DC
  Administered 2013-12-17: 22:00:00 via INTRAVENOUS
  Filled 2013-12-17 (×8): qty 1000

## 2013-12-17 MED ORDER — BUPIVACAINE-EPINEPHRINE PF 0.25-1:200000 % IJ SOLN
INTRAMUSCULAR | Status: DC | PRN
  Administered 2013-12-17: 25 mL

## 2013-12-17 MED ORDER — BUPIVACAINE LIPOSOME 1.3 % IJ SUSP
20.0000 mL | Freq: Once | INTRAMUSCULAR | Status: DC
  Filled 2013-12-17: qty 20

## 2013-12-17 MED ORDER — BUPIVACAINE IN DEXTROSE 0.75-8.25 % IT SOLN
INTRATHECAL | Status: DC | PRN
  Administered 2013-12-17: 1.8 mL via INTRATHECAL

## 2013-12-17 MED ORDER — 0.9 % SODIUM CHLORIDE (POUR BTL) OPTIME
TOPICAL | Status: DC | PRN
  Administered 2013-12-17: 1000 mL

## 2013-12-17 MED ORDER — CELECOXIB 200 MG PO CAPS
200.0000 mg | ORAL_CAPSULE | Freq: Two times a day (BID) | ORAL | Status: DC
  Administered 2013-12-17 – 2013-12-19 (×4): 200 mg via ORAL
  Filled 2013-12-17 (×5): qty 1

## 2013-12-17 MED ORDER — WARFARIN SODIUM 3 MG PO TABS
3.0000 mg | ORAL_TABLET | Freq: Once | ORAL | Status: AC
  Administered 2013-12-17: 3 mg via ORAL
  Filled 2013-12-17: qty 1

## 2013-12-17 MED ORDER — DIPHENHYDRAMINE HCL 25 MG PO CAPS
25.0000 mg | ORAL_CAPSULE | Freq: Four times a day (QID) | ORAL | Status: DC | PRN
Start: 2013-12-17 — End: 2013-12-19

## 2013-12-17 MED ORDER — LOSARTAN POTASSIUM-HCTZ 100-25 MG PO TABS: 0.5000 | ORAL_TABLET | Freq: Every morning | ORAL | Status: DC

## 2013-12-17 MED ORDER — AMIODARONE HCL 200 MG PO TABS
200.0000 mg | ORAL_TABLET | Freq: Every morning | ORAL | Status: DC
  Administered 2013-12-18 – 2013-12-19 (×2): 200 mg via ORAL
  Filled 2013-12-17 (×2): qty 1

## 2013-12-17 MED ORDER — ONDANSETRON HCL 4 MG/2ML IJ SOLN
INTRAMUSCULAR | Status: AC
  Filled 2013-12-17: qty 2

## 2013-12-17 MED ORDER — VANCOMYCIN HCL IN DEXTROSE 1-5 GM/200ML-% IV SOLN
1000.0000 mg | INTRAVENOUS | Status: DC
  Administered 2013-12-18 – 2013-12-19 (×2): 1000 mg via INTRAVENOUS
  Filled 2013-12-17 (×2): qty 200

## 2013-12-17 MED ORDER — FUROSEMIDE 40 MG PO TABS
40.0000 mg | ORAL_TABLET | Freq: Every day | ORAL | Status: DC
  Administered 2013-12-19: 40 mg via ORAL
  Filled 2013-12-17 (×2): qty 1

## 2013-12-17 MED ORDER — ATORVASTATIN CALCIUM 20 MG PO TABS
20.0000 mg | ORAL_TABLET | ORAL | Status: DC
  Administered 2013-12-18 – 2013-12-19 (×2): 20 mg via ORAL
  Filled 2013-12-17 (×2): qty 1

## 2013-12-17 MED ORDER — SERTRALINE HCL 50 MG PO TABS
50.0000 mg | ORAL_TABLET | Freq: Every evening | ORAL | Status: DC
  Administered 2013-12-17 – 2013-12-19 (×3): 50 mg via ORAL
  Filled 2013-12-17 (×3): qty 1

## 2013-12-17 MED ORDER — KETOROLAC TROMETHAMINE 30 MG/ML IJ SOLN
INTRAMUSCULAR | Status: DC | PRN
  Administered 2013-12-17: 30 mg

## 2013-12-17 MED ORDER — MENTHOL 3 MG MT LOZG
1.0000 | LOZENGE | OROMUCOSAL | Status: DC | PRN
  Filled 2013-12-17: qty 9

## 2013-12-17 MED ORDER — ONDANSETRON HCL 4 MG/2ML IJ SOLN
INTRAMUSCULAR | Status: DC | PRN
  Administered 2013-12-17: 4 mg via INTRAVENOUS

## 2013-12-17 MED ORDER — SODIUM CHLORIDE 0.9 % IR SOLN
Status: DC | PRN
  Administered 2013-12-17: 3000 mL

## 2013-12-17 MED ORDER — HYDROMORPHONE HCL PF 1 MG/ML IJ SOLN: 0.5000 mg | INTRAMUSCULAR | Status: DC | PRN

## 2013-12-17 MED ORDER — MAGNESIUM CITRATE PO SOLN: 1.0000 | Freq: Once | ORAL | Status: AC | PRN

## 2013-12-17 MED ORDER — NITROGLYCERIN 0.4 MG SL SUBL: 0.4000 mg | SUBLINGUAL_TABLET | SUBLINGUAL | Status: DC | PRN

## 2013-12-17 MED ORDER — ENOXAPARIN SODIUM 30 MG/0.3ML ~~LOC~~ SOLN
30.0000 mg | SUBCUTANEOUS | Status: DC
  Administered 2013-12-18: 30 mg via SUBCUTANEOUS
  Filled 2013-12-17: qty 0.3

## 2013-12-17 MED ORDER — ONDANSETRON HCL 4 MG PO TABS: 4.0000 mg | ORAL_TABLET | Freq: Four times a day (QID) | ORAL | Status: DC | PRN

## 2013-12-17 MED ORDER — CEFAZOLIN SODIUM-DEXTROSE 2-3 GM-% IV SOLR
2.0000 g | INTRAVENOUS | Status: AC
  Administered 2013-12-17: 2 g via INTRAVENOUS

## 2013-12-17 MED ORDER — SODIUM CHLORIDE 0.9 % IV SOLN
INTRAVENOUS | Status: DC | PRN
  Administered 2013-12-17: 14 mL

## 2013-12-17 MED ORDER — FAMOTIDINE 10 MG PO TABS
10.0000 mg | ORAL_TABLET | Freq: Every morning | ORAL | Status: DC
  Administered 2013-12-18 – 2013-12-19 (×2): 10 mg via ORAL
  Filled 2013-12-17 (×2): qty 1

## 2013-12-17 MED ORDER — PREGABALIN 75 MG PO CAPS
75.0000 mg | ORAL_CAPSULE | Freq: Two times a day (BID) | ORAL | Status: DC
  Administered 2013-12-17 – 2013-12-19 (×4): 75 mg via ORAL
  Filled 2013-12-17 (×4): qty 1

## 2013-12-17 MED ORDER — BUPIVACAINE LIPOSOME 1.3 % IJ SUSP
INTRAMUSCULAR | Status: DC | PRN
  Administered 2013-12-17: 20 mL

## 2013-12-17 MED ORDER — FENTANYL CITRATE 0.05 MG/ML IJ SOLN
INTRAMUSCULAR | Status: DC | PRN
  Administered 2013-12-17: 50 ug via INTRAVENOUS
  Administered 2013-12-17: 25 ug via INTRAVENOUS
  Administered 2013-12-17: 50 ug via INTRAVENOUS

## 2013-12-17 MED ORDER — SODIUM CHLORIDE 0.9 % IJ SOLN
INTRAMUSCULAR | Status: AC
  Filled 2013-12-17: qty 50

## 2013-12-17 MED ORDER — KETOROLAC TROMETHAMINE 30 MG/ML IJ SOLN
INTRAMUSCULAR | Status: AC
  Filled 2013-12-17: qty 1

## 2013-12-17 MED ORDER — CEFAZOLIN SODIUM-DEXTROSE 2-3 GM-% IV SOLR
INTRAVENOUS | Status: AC
  Filled 2013-12-17: qty 50

## 2013-12-17 MED ORDER — FENTANYL CITRATE 0.05 MG/ML IJ SOLN
25.0000 ug | INTRAMUSCULAR | Status: DC | PRN
  Administered 2013-12-17: 50 ug via INTRAVENOUS

## 2013-12-17 MED ORDER — METHOCARBAMOL 500 MG PO TABS
500.0000 mg | ORAL_TABLET | Freq: Four times a day (QID) | ORAL | Status: DC | PRN
  Administered 2013-12-17 – 2013-12-18 (×4): 500 mg via ORAL
  Filled 2013-12-17 (×4): qty 1

## 2013-12-17 MED ORDER — METHOCARBAMOL 100 MG/ML IJ SOLN
500.0000 mg | Freq: Four times a day (QID) | INTRAMUSCULAR | Status: DC | PRN
  Filled 2013-12-17: qty 5

## 2013-12-17 MED ORDER — ATENOLOL 100 MG PO TABS
100.0000 mg | ORAL_TABLET | Freq: Two times a day (BID) | ORAL | Status: DC
  Administered 2013-12-18 – 2013-12-19 (×3): 100 mg via ORAL
  Filled 2013-12-17 (×5): qty 1

## 2013-12-17 MED ORDER — ALUM & MAG HYDROXIDE-SIMETH 200-200-20 MG/5ML PO SUSP: 30.0000 mL | ORAL | Status: DC | PRN

## 2013-12-17 SURGICAL SUPPLY — 61 items
BAG ZIPLOCK 12X15 (MISCELLANEOUS) ×3 IMPLANT
BANDAGE ELASTIC 4 VELCRO ST LF (GAUZE/BANDAGES/DRESSINGS) IMPLANT
BANDAGE ELASTIC 6 VELCRO ST LF (GAUZE/BANDAGES/DRESSINGS) ×3 IMPLANT
BANDAGE ESMARK 6X9 LF (GAUZE/BANDAGES/DRESSINGS) ×1 IMPLANT
BNDG ESMARK 6X9 LF (GAUZE/BANDAGES/DRESSINGS) ×3
CLOSURE WOUND 1/2 X4 (GAUZE/BANDAGES/DRESSINGS)
CONT SPECI 4OZ STER CLIK (MISCELLANEOUS) ×3 IMPLANT
CUFF TOURN SGL QUICK 34 (TOURNIQUET CUFF) ×2
CUFF TRNQT CYL 34X4X40X1 (TOURNIQUET CUFF) ×1 IMPLANT
DECANTER SPIKE VIAL GLASS SM (MISCELLANEOUS) IMPLANT
DERMABOND ADVANCED (GAUZE/BANDAGES/DRESSINGS) ×2
DERMABOND ADVANCED .7 DNX12 (GAUZE/BANDAGES/DRESSINGS) ×1 IMPLANT
DRAPE EXTREMITY T 121X128X90 (DRAPE) ×3 IMPLANT
DRAPE POUCH INSTRU U-SHP 10X18 (DRAPES) ×3 IMPLANT
DRAPE U-SHAPE 47X51 STRL (DRAPES) ×3 IMPLANT
DRSG ADAPTIC 3X8 NADH LF (GAUZE/BANDAGES/DRESSINGS) IMPLANT
DRSG AQUACEL AG ADV 3.5X14 (GAUZE/BANDAGES/DRESSINGS) ×3 IMPLANT
DRSG PAD ABDOMINAL 8X10 ST (GAUZE/BANDAGES/DRESSINGS) IMPLANT
DURAPREP 26ML APPLICATOR (WOUND CARE) ×3 IMPLANT
ELECT REM PT RETURN 9FT ADLT (ELECTROSURGICAL) ×3
ELECTRODE REM PT RTRN 9FT ADLT (ELECTROSURGICAL) ×1 IMPLANT
EVACUATOR 1/8 PVC DRAIN (DRAIN) IMPLANT
FACESHIELD WRAPAROUND (MASK) IMPLANT
FLOSEAL 10ML (HEMOSTASIS) ×3 IMPLANT
GLOVE BIOGEL PI IND STRL 7.5 (GLOVE) ×1 IMPLANT
GLOVE BIOGEL PI IND STRL 8 (GLOVE) ×1 IMPLANT
GLOVE BIOGEL PI INDICATOR 7.5 (GLOVE) ×2
GLOVE BIOGEL PI INDICATOR 8 (GLOVE) ×2
GLOVE ECLIPSE 8.0 STRL XLNG CF (GLOVE) IMPLANT
GLOVE ORTHO TXT STRL SZ7.5 (GLOVE) ×6 IMPLANT
GLOVE SURG ORTHO 8.0 STRL STRW (GLOVE) ×3 IMPLANT
GOWN SPEC L3 XXLG W/TWL (GOWN DISPOSABLE) ×6 IMPLANT
GOWN STRL REUS W/TWL LRG LVL3 (GOWN DISPOSABLE) ×3 IMPLANT
HANDPIECE INTERPULSE COAX TIP (DISPOSABLE) ×2
IMMOBILIZER KNEE 20 (SOFTGOODS) IMPLANT
KIT BASIN OR (CUSTOM PROCEDURE TRAY) ×3 IMPLANT
MANIFOLD NEPTUNE II (INSTRUMENTS) ×3 IMPLANT
NDL SAFETY ECLIPSE 18X1.5 (NEEDLE) ×1 IMPLANT
NEEDLE HYPO 18GX1.5 SHARP (NEEDLE) ×2
NS IRRIG 1000ML POUR BTL (IV SOLUTION) ×6 IMPLANT
PACK TOTAL JOINT (CUSTOM PROCEDURE TRAY) ×3 IMPLANT
PADDING CAST COTTON 6X4 STRL (CAST SUPPLIES) IMPLANT
PLATE ROT INSERT SZ 2.0 10MM (Plate) ×3 IMPLANT
POSITIONER SURGICAL ARM (MISCELLANEOUS) ×3 IMPLANT
SET HNDPC FAN SPRY TIP SCT (DISPOSABLE) ×1 IMPLANT
SPONGE GAUZE 4X4 12PLY (GAUZE/BANDAGES/DRESSINGS) IMPLANT
SPONGE LAP 18X18 X RAY DECT (DISPOSABLE) IMPLANT
STRIP CLOSURE SKIN 1/2X4 (GAUZE/BANDAGES/DRESSINGS) IMPLANT
SUCTION FRAZIER 12FR DISP (SUCTIONS) ×3 IMPLANT
SUT MNCRL AB 4-0 PS2 18 (SUTURE) ×3 IMPLANT
SUT VIC AB 1 CT1 36 (SUTURE) ×9 IMPLANT
SUT VIC AB 2-0 CT1 27 (SUTURE) ×6
SUT VIC AB 2-0 CT1 TAPERPNT 27 (SUTURE) ×3 IMPLANT
SWAB COLLECTION DEVICE MRSA (MISCELLANEOUS) ×3 IMPLANT
SYR 50ML LL SCALE MARK (SYRINGE) ×3 IMPLANT
SYR CONTROL 10ML LL (SYRINGE) IMPLANT
TOWEL OR 17X26 10 PK STRL BLUE (TOWEL DISPOSABLE) ×6 IMPLANT
TRAY FOLEY CATH 14FRSI W/METER (CATHETERS) ×3 IMPLANT
TUBE ANAEROBIC SPECIMEN COL (MISCELLANEOUS) ×3 IMPLANT
WATER STERILE IRR 1500ML POUR (IV SOLUTION) ×3 IMPLANT
WRAP KNEE MAXI GEL POST OP (GAUZE/BANDAGES/DRESSINGS) ×3 IMPLANT

## 2013-12-17 NOTE — Anesthesia Preprocedure Evaluation (Signed)
Anesthesia Evaluation  Patient identified by MRN, date of birth, ID band Patient awake    Reviewed: Allergy & Precautions, H&P , NPO status , Patient's Chart, lab work & pertinent test results  History of Anesthesia Complications (+) PONV, Family history of anesthesia reaction and history of anesthetic complications  Airway Mallampati: II TM Distance: >3 FB Neck ROM: Full    Dental no notable dental hx.    Pulmonary neg pulmonary ROS,  breath sounds clear to auscultation  Pulmonary exam normal       Cardiovascular hypertension, Pt. on medications and Pt. on home beta blockers + CAD and + Peripheral Vascular Disease + pacemaker + Valvular Problems/Murmurs AS Rhythm:Regular Rate:Normal     Neuro/Psych  Headaches, PSYCHIATRIC DISORDERS Anxiety Depression    GI/Hepatic Neg liver ROS, GERD-  Medicated,  Endo/Other  Hypothyroidism   Renal/GU Renal disease  negative genitourinary   Musculoskeletal negative musculoskeletal ROS (+)   Abdominal   Peds negative pediatric ROS (+)  Hematology  (+) Blood dyscrasia, ,   Anesthesia Other Findings   Reproductive/Obstetrics negative OB ROS                           Anesthesia Physical Anesthesia Plan  ASA: III  Anesthesia Plan: Spinal   Post-op Pain Management:    Induction: Intravenous  Airway Management Planned:   Additional Equipment:   Intra-op Plan:   Post-operative Plan: Extubation in OR  Informed Consent: I have reviewed the patients History and Physical, chart, labs and discussed the procedure including the risks, benefits and alternatives for the proposed anesthesia with the patient or authorized representative who has indicated his/her understanding and acceptance.   Dental advisory given  Plan Discussed with: CRNA  Anesthesia Plan Comments: (Discussed r/b general versus spinal. Last coumadin 5 days ago. INR is 0.96 Discussed  risks/benefits of spinal including headache, backache, failure, bleeding, infection, and nerve damage. Patient consents to spinal. Questions answered. Coagulation studies and platelet count acceptable. Discussed with patient and daughter.)        Anesthesia Quick Evaluation

## 2013-12-17 NOTE — Progress Notes (Signed)
ANTIBIOTIC CONSULT NOTE - INITIAL  Pharmacy Consult for vancomycin Indication: Prosthetic joint infection  Allergies  Allergen Reactions  . Codeine Nausea And Vomiting  . Morphine And Related Nausea And Vomiting  . Requip [Ropinirole Hcl]     Headache   . Zinc     nausea    Patient Measurements:   Adjusted Body Weight:   Vital Signs: Temp: 98.4 F (36.9 C) (04/13 2039) Temp src: Oral (04/13 1424) BP: 156/73 mmHg (04/13 2039) Pulse Rate: 62 (04/13 2039) Intake/Output from previous day:   Intake/Output from this shift: Total I/O In: 400 [I.V.:400] Out: 75 [Urine:75]  Labs: No results found for this basename: WBC, HGB, PLT, LABCREA, CREATININE,  in the last 72 hours The CrCl is unknown because both a height and weight (above a minimum accepted value) are required for this calculation. No results found for this basename: VANCOTROUGH, Corlis Leak, VANCORANDOM, GENTTROUGH, GENTPEAK, GENTRANDOM, TOBRATROUGH, TOBRAPEAK, TOBRARND, AMIKACINPEAK, AMIKACINTROU, AMIKACIN,  in the last 72 hours   Microbiology: Recent Results (from the past 720 hour(s))  SURGICAL PCR SCREEN     Status: None   Collection Time    12/14/13 10:20 AM      Result Value Ref Range Status   MRSA, PCR NEGATIVE  NEGATIVE Final   Staphylococcus aureus NEGATIVE  NEGATIVE Final   Comment:            The Xpert SA Assay (FDA     approved for NASAL specimens     in patients over 78 years of age),     is one component of     a comprehensive surveillance     program.  Test performance has     been validated by [name] for patients greater     than or equal to 3 year old.     It is not intended     to diagnose infection nor to     guide or monitor treatment.    Medical History: Past Medical History  Diagnosis Date  . PAF (paroxysmal atrial fibrillation)     controlled with amiodarone, on coumadin  . CAD (coronary artery disease)   . Aortic root dilatation   . Moderate aortic stenosis   .  Chronic anticoagulation   . Tachycardia-bradycardia syndrome     s/p PPM by Dr Doreatha Lew (MDT) 07/03/10  . GERD (gastroesophageal reflux disease)   . Chronic kidney disease   . Pacemaker     2012  . History of nephrectomy     left - due to kidney cancer  . Neuropathy     feet/legs  . Restless leg syndrome   . Headache(784.0)     hx migraines - takes Zoloft for migraines  . History of uterine cancer   . Cancer     hx Kidney cancer / hx of endometrial cancer  . PONV (postoperative nausea and vomiting)   . Anxiety   . Heart murmur   . HTN (hypertension)   . Hyperlipemia   . Hypothyroidism   . 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 Osteomyelitis as child  . Family history of anesthesia complication     daughter has ponv    Assessment: 80 YOF admitted for I&D with poly exchange of R knee due to prosthetic joint infection, orders to start vancomycin post-op, SCr is mildly elevated. Given cefazolin pre-op  4/13 >> vancomycin  >>  Tmax: WBCs: 9.9 Renal: SCr = 1.33 for est CrCL = 63ml/min (C-G) and 83ml/min (  N)  4/13: Knee fluid (intra-op): pending  Goal of Therapy:  Vancomycin trough level 15-20 mcg/ml  Plan:   Vancomycin 1500mg  IV x 1 then 1gm IV q24h  Follow renal function and check level at steady state  Doreene Eland, PharmD, BCPS.   Pager: [phone]  12/17/2013,9:03 PM

## 2013-12-17 NOTE — Brief Op Note (Signed)
12/17/2013  7:02 PM  PATIENT:  [name]  80 y.o. female  PRE-OPERATIVE DIAGNOSIS:  PAINFUL RIGHT TOTAL KNEE AND INFECTION  POST-OPERATIVE DIAGNOSIS:  PAINFUL RIGHT TOTAL KNEE   PROCEDURE:  Procedure(s): IRRIGATION AND DEBRIDEMEN TRIGHT TOTAL  KNEE WITH POLY EXCHANGE (Right)  SURGEON:  Surgeon(s) and Role:    * Mauri Pole, MD - Primary  PHYSICIAN ASSISTANT:  Danae Orleans, PA-C  ANESTHESIA:   spinal and general  EBL:     BLOOD ADMINISTERED:none  DRAINS: none   LOCAL MEDICATIONS USED:  Exparel, marcaine combination  SPECIMEN:  Source of Specimen:  right knee fluid  DISPOSITION OF SPECIMEN:  PATHOLOGY  COUNTS:  YES  TOURNIQUET:   Total Tourniquet Time Documented: Thigh (Right) - 27 minutes Total: Thigh (Right) - 27 minutes   DICTATION: .Other Dictation: Dictation Number [id number]  PLAN OF CARE: Admit to inpatient   PATIENT DISPOSITION:  PACU - hemodynamically stable.   Delay start of Pharmacological VTE agent (>24hrs) due to surgical blood loss or risk of bleeding: no

## 2013-12-17 NOTE — Anesthesia Procedure Notes (Signed)
Spinal  Patient location during procedure: OR Start time: 12/17/2013 5:51 PM End time: 12/17/2013 5:56 PM Staffing CRNA/Resident: Anne Fu Performed by: resident/CRNA  Preanesthetic Checklist Completed: patient identified, site marked, surgical consent, pre-op evaluation, timeout performed, IV checked, risks and benefits discussed and monitors and equipment checked Spinal Block Patient position: sitting Prep: Betadine Patient monitoring: heart rate, continuous pulse ox and blood pressure Approach: right paramedian Location: L2-3 Injection technique: single-shot Needle Needle type: Spinocan  Needle gauge: 22 G Needle length: 9 cm Assessment Events: failed spinal Additional Notes Expiration date of kit checked and confirmed. Patient tolerated procedure well, without complications. X 1 attempt with noted CSF return, medication injected, not noted sensory or motor lose post injection. Pt converted to Ola without incidences.

## 2013-12-17 NOTE — Progress Notes (Signed)
ANTICOAGULATION CONSULT NOTE - Initial Consult  Pharmacy Consult for warfarin Indication: afib/VTE prophylaxis  Allergies  Allergen Reactions  . Codeine Nausea And Vomiting  . Morphine And Related Nausea And Vomiting  . Requip [Ropinirole Hcl]     Headache   . Zinc     nausea    Patient Measurements:   Heparin Dosing Weight:   Vital Signs: Temp: 98.4 F (36.9 C) (04/13 2039) Temp src: Oral (04/13 1424) BP: 156/73 mmHg (04/13 2039) Pulse Rate: 62 (04/13 2039)  Labs:  Recent Labs  12/17/13 1433  LABPROT 12.6  INR 0.96    The CrCl is unknown because both a height and weight (above a minimum accepted value) are required for this calculation.   Medical History: Past Medical History  Diagnosis Date  . PAF (paroxysmal atrial fibrillation)     controlled with amiodarone, on coumadin  . CAD (coronary artery disease)   . Aortic root dilatation   . Moderate aortic stenosis   . Chronic anticoagulation   . Tachycardia-bradycardia syndrome     s/p PPM by Dr Doreatha Lew (MDT) 07/03/10  . GERD (gastroesophageal reflux disease)   . Chronic kidney disease   . Pacemaker     2012  . History of nephrectomy     left - due to kidney cancer  . Neuropathy     feet/legs  . Restless leg syndrome   . Headache(784.0)     hx migraines - takes Zoloft for migraines  . History of uterine cancer   . Cancer     hx Kidney cancer / hx of endometrial cancer  . PONV (postoperative nausea and vomiting)   . Anxiety   . Heart murmur   . HTN (hypertension)   . Hyperlipemia   . Hypothyroidism   . 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 Osteomyelitis as child  . Family history of anesthesia complication     daughter has ponv    Assessment: 33 YOF admitted for I&D with poly exchange of R knee due to prosthetic joint infection. She is on warfarin prior to admission for afib.  INR day of surgery was 0.96.  Stated home dose is 2mg  daily except 1mg  on Wed/Fri  Potential drug interaction: amiodarone but  home medication so warfarin dose already adjusted for this  On celebrex  Goal of Therapy:  INR 2-3 Monitor platelets by anticoagulation protocol: Yes   Plan:   Warfarin 3mg  po x 1 tonight  Daily INR  Continue enoxaparin until INR >= 1.8  Doreene Eland, PharmD, BCPS.   Pager: [phone]  12/17/2013,8:51 PM

## 2013-12-17 NOTE — Interval H&P Note (Signed)
History and Physical Interval Note:  12/17/2013 5:12 PM  [name]  has presented today for surgery, with the diagnosis of PAINFUL RIGHT TOTAL KNEE AND INFECTION  The various methods of treatment have been discussed with the patient and family. After consideration of risks, benefits and other options for treatment, the patient has consented to  Procedure(s): IRRIGATION AND DEBRIDEMEN TRIGHT TOTAL  KNEE WITH POLY EXCHANGE (Right) as a surgical intervention .  The patient's history has been reviewed, patient examined, no change in status, stable for surgery.  I have reviewed the patient's chart and labs.  Questions were answered to the patient's satisfaction.     Mauri Pole

## 2013-12-17 NOTE — Transfer of Care (Signed)
Immediate Anesthesia Transfer of Care Note  Patient: [name]  Procedure(s) Performed: Procedure(s) (LRB): IRRIGATION AND DEBRIDEMEN RIGHT TOTAL  KNEE WITH POLY EXCHANGE (Right)  Patient Location: PACU  Anesthesia Type: General  Level of Consciousness: sedated, patient cooperative and responds to stimulation  Airway & Oxygen Therapy: Patient Spontanous Breathing and Patient connected to face mask oxgen  Post-op Assessment: Report given to PACU RN and Post -op Vital signs reviewed and stable  Post vital signs: Reviewed and stable  Complications: No apparent anesthesia complications

## 2013-12-18 ENCOUNTER — Encounter (HOSPITAL_COMMUNITY): Payer: Self-pay | Admitting: Orthopedic Surgery

## 2013-12-18 ENCOUNTER — Inpatient Hospital Stay (HOSPITAL_COMMUNITY): Payer: 59

## 2013-12-18 LAB — BASIC METABOLIC PANEL
BUN: 27 mg/dL — ABNORMAL HIGH (ref 6–23)
CHLORIDE: 101 meq/L (ref 96–112)
CO2: 22 mEq/L (ref 19–32)
Calcium: 8.2 mg/dL — ABNORMAL LOW (ref 8.4–10.5)
Creatinine, Ser: 1.22 mg/dL — ABNORMAL HIGH (ref 0.50–1.10)
GFR calc Af Amer: 48 mL/min — ABNORMAL LOW (ref >90)
GFR calc non Af Amer: 42 mL/min — ABNORMAL LOW (ref >90)
Glucose, Bld: 126 mg/dL — ABNORMAL HIGH (ref 70–99)
Potassium: 3.8 mEq/L (ref 3.7–5.3)
Sodium: 136 mEq/L — ABNORMAL LOW (ref 137–147)

## 2013-12-18 LAB — CBC
HCT: 36.1 % (ref 36.0–46.0)
Hemoglobin: 12.4 g/dL (ref 12.0–15.0)
MCH: 30.7 pg (ref 26.0–34.0)
MCHC: 34.3 g/dL (ref 30.0–36.0)
MCV: 89.4 fL (ref 78.0–100.0)
PLATELETS: 188 10*3/uL (ref 150–400)
RBC: 4.04 MIL/uL (ref 3.87–5.11)
RDW: 13.8 % (ref 11.5–15.5)
WBC: 8.1 10*3/uL (ref 4.0–10.5)

## 2013-12-18 LAB — PROTIME-INR
INR: 0.95 (ref 0.00–1.49)
Prothrombin Time: 12.5 seconds (ref 11.6–15.2)

## 2013-12-18 MED ORDER — WARFARIN SODIUM 3 MG PO TABS
3.0000 mg | ORAL_TABLET | Freq: Once | ORAL | Status: AC
  Administered 2013-12-18: 3 mg via ORAL
  Filled 2013-12-18: qty 1

## 2013-12-18 MED ORDER — ENOXAPARIN SODIUM 40 MG/0.4ML ~~LOC~~ SOLN
40.0000 mg | Freq: Every day | SUBCUTANEOUS | Status: DC
  Administered 2013-12-19: 40 mg via SUBCUTANEOUS
  Filled 2013-12-18: qty 0.4

## 2013-12-18 MED ORDER — SODIUM CHLORIDE 0.9 % IJ SOLN
10.0000 mL | INTRAMUSCULAR | Status: DC | PRN
  Administered 2013-12-19 (×2): 10 mL

## 2013-12-18 NOTE — Op Note (Signed)
NAMEGRACIEMAE, [name]:  [id number]  MEDICAL RECORD NO.:  [id number]  LOCATION:  9024                         FACILITY:  [facility]  PHYSICIAN:  Pietro Cassis. Alvan Dame, M.D.  DATE OF BIRTH:  08/20/1934  DATE OF PROCEDURE:  12/17/2013 DATE OF DISCHARGE:                              OPERATIVE REPORT   PREOPERATIVE DIAGNOSIS:  Painful right total knee arthroplasty with concern with infection from preoperative workup.  POSTOPERATIVE DIAGNOSIS:  Painful right knee with synovitic response intraoperatively.  FINDINGS:  The patient's synovial fluid appeared to be normal clear synovial fluid.  No evidence of any inflammatory change.  There was some scarring and inflammation.  [name] always had a very limited range of motion from her preoperative, postoperative state, this has been unchanged.  PROCEDURES: 1. Excisional debridement of right knee through a 10-inch incision     involving the skin and subcutaneous tissue, intra-articular scar     with an associated nonexcisional debridement with 1500 mL normal     saline solution with pulse lavage. 2. Polyethylene exchange, right knee for a size 2 x 10-mm insert.  SURGEON:  Pietro Cassis. Alvan Dame, M.D.  ASSISTANT:  Danae Orleans, PA-C.  Note that [name] was present for the entirety of the case from preoperative position, perioperative management of the operative extremity, general facilitation of the case, and primary wound closure.  ANESTHESIA:  General and spinal, was uncertain if the spinal was working since the general anesthetic.  SPECIMENS:  From the right knee joint fluid sent for Gram stain culture, aerobic and anaerobic.  DRAINS:  None.  TOURNIQUET TIME:  27 minutes at 250 mmHg.  COMPLICATIONS:  None.  INDICATION FOR PROCEDURE:  [name] is a very pleasant 80 year old female, now about 2 years out from right total knee arthroplasty.  Her preoperative range of motion was severely limited from  childhood condition of the knee.  She has initially done very well, but presented in the office and over the phone with new-onset pain in the right knee. No fevers reported.  Lab work revealed significantly elevated sedimentation, C-reactive protein.  Bone scan revealed evidence of synovial inflammation without evidence of any bony involvement.  I had a lengthy discussion with [name] and particularly her daughters who helped with her medical decision making and determine that at this point given the relative acuity that we should proceed with an operative intervention to wash the knee out and try to preserve the knee joint for concerns of potential infection.  Risks of possible deep infection, need for removal of implants were reviewed in addition to standard risks of her postoperative recovery.  PROCEDURE IN DETAIL:  The patient was brought to the operative theater. Once adequate anesthesia, preoperative antibiotics, Ancef administered, she was positioned supine with the right thigh tourniquet placed.  Right lower extremity was then prepped and draped in sterile fashion.  Time- out was performed identifying the patient, planned procedure, and extremity.  Leg was exsanguinated and tourniquet was elevated to 250 mmHg.  Her old incision was utilized, the soft tissue planes created. Her range of motion was only from about perhaps a couple degrees of hyperextension to flexion only to about  10-15 degrees.  This again is normal for her from childhood.  Once soft tissues were exposed, median arthrotomy was made.  The synovial fluid that we encountered was clear and yellow.  There was no sign of inflammatory nature to it at all and definitely no pus.  The lining of her joint appeared to have some inflammation, but no significant polyethylene debris or osteolytic changes noted.  Given the significant limited range of motion, her soft tissues were extremely tight, so lot of time was spent  exposing her knee with proximal medial peel all the way around to the posterior third of the medial proximal tibia.  I also performed a banana peel-type exposure not elevating the soft tissues off to the tibial tubercle.  With this care, I was able to expose the knee, I was able to debride around the patella, any potential scar tissue.  I then was able to remove the old implant, polyethylene insert allowing for further irrigation and debridement of the knee.  Following the excisional debridement portion of the case, I then irrigated the knee with nonexcisional debridement with 1500 mL normal saline solution.  Once this was done, we selected the same, inserted 10-mm insert to match 2 femur.  This was then inserted into the knee.  The knee was re-irrigated at this point.  Tourniquet was let down.  I reapproximated the extensor mechanism using the combination of #1 Vicryl and 0 V-Loc.  Care was then attended to the distal aspect, reapproximated the lateral peel over the tubercle to the medial proximal peel.  The remainder of the wound was closed with 2-0 Vicryl and running 4-0 Monocryl.  The knee was cleaned, dried and dressed sterilely using Dermabond and Aquacel dressing.  Findings will be reviewed with the family.  We planned to placed her on PIC line with antibiotics for 4 weeks.  Stool cultures turned for prophylaxis.  I am uncertain of the etiology of inflammation unless at synovial base.  She does not have a diagnosis of rheumatoid arthritis, but perhaps we will work this up in this 80 year old female.     Pietro Cassis Alvan Dame, M.D.     MDO/MEDQ  D:  12/17/2013  T:  12/18/2013  Job:  [id number]

## 2013-12-18 NOTE — Progress Notes (Signed)
Peripherally Inserted Central Catheter/Midline Placement  The IV Nurse has discussed with the patient and/or persons authorized to consent for the patient, the purpose of this procedure and the potential benefits and risks involved with this procedure.  The benefits include less needle sticks, lab draws from the catheter and patient may be discharged home with the catheter.  Risks include, but not limited to, infection, bleeding, blood clot (thrombus formation), and puncture of an artery; nerve damage and irregular heat beat.  Alternatives to this procedure were also discussed.  PICC/Midline Placement Documentation  PICC / Midline Single Lumen 08/81/10 PICC Left Basilic 40 cm 0 cm (Active)  Indication for Insertion or Continuance of Line Home intravenous therapies (PICC only) 12/18/2013  9:00 AM  Exposed Catheter (cm) 0 cm 12/18/2013  9:00 AM  Dressing Change Due 12/25/13 12/18/2013  9:00 AM       Chalene Treu Sharlyn Bologna 12/18/2013, 9:42 AM

## 2013-12-18 NOTE — Progress Notes (Signed)
   Subjective: 1 Day Post-Op Procedure(s) (LRB): IRRIGATION AND DEBRIDEMEN RIGHT TOTAL  KNEE WITH POLY EXCHANGE (Right)   Seen by Dr. Alvan Dame Patient reports pain as mild, pain controlled. No events throughout the night.  Discussed findings and plan. To get a PICC line today and Vanco adjusted by pharmacy.  Objective:   VITALS:   Filed Vitals:   12/18/13 0500  BP: 122/77  Pulse: 62  Temp: 97.6 F (36.4 C)  Resp: 18    Neurovascular intact Dorsiflexion/Plantar flexion intact Incision: dressing C/D/I No cellulitis present Compartment soft  LABS  Recent Labs  12/18/13 0412  HGB 12.4  HCT 36.1  WBC 8.1  PLT 188     Recent Labs  12/17/13 2113 12/18/13 0412  NA  --  136*  K  --  3.8  BUN  --  27*  CREATININE 1.11* 1.22*  GLUCOSE  --  126*     Assessment/Plan: 1 Day Post-Op Procedure(s) (LRB): IRRIGATION AND DEBRIDEMEN RIGHT TOTAL  KNEE WITH POLY EXCHANGE (Right) Foley cath d/c'ed Advance diet Up with therapy D/C IV fluids Discharge home with home health, eventually, when ready  Overweight (BMI 25-29.9) Estimated body mass index is 26.94 kg/(m^2) as calculated from the following:   Height as of this encounter: 5\' 1"  (1.549 m).   Weight as of this encounter: 64.638 kg (142 lb 8 oz). Patient also counseled that weight may inhibit the healing process Patient counseled that losing weight will help with future health issues      [name]   PAC  12/18/2013, 7:35 AM

## 2013-12-18 NOTE — Evaluation (Signed)
Physical Therapy Evaluation Patient Details Name: [name] MRN: [id number] DOB: 10/21/1933 Today's Date: 12/18/2013   History of Present Illness  I&D R knee with poly exchange  Clinical Impression  **Pt admitted with infection of prosthetic knee joint, s/p I&D with poly exchange*. Pt currently with functional limitations due to the deficits listed below (see PT Problem List).  Pt will benefit from skilled PT to increase their independence and safety with mobility to allow discharge to the venue listed below.   *    Follow Up Recommendations Home health PT    Equipment Recommendations  None recommended by PT    Recommendations for Other Services       Precautions / Restrictions Precautions Precautions: Fall Restrictions Weight Bearing Restrictions: No Other Position/Activity Restrictions: WBAT      Mobility  Bed Mobility                  Transfers Overall transfer level: Needs assistance Equipment used: Rolling walker (2 wheeled) Transfers: Sit to/from Stand Sit to Stand: Min assist Stand pivot transfers: Min guard       General transfer comment: min A for initial posterior lean in standing  Ambulation/Gait Ambulation/Gait assistance: Min guard Ambulation Distance (Feet): 150 Feet Assistive device: Rolling walker (2 wheeled) Gait Pattern/deviations: Step-to pattern;Antalgic;Decreased stance time - right Gait velocity: WFL      Stairs            Wheelchair Mobility    Modified Rankin (Stroke Patients Only)       Balance Overall balance assessment: Needs assistance Sitting-balance support: No upper extremity supported;Feet supported Sitting balance-Leahy Scale: Good     Standing balance support: No upper extremity supported Standing balance-Leahy Scale: Poor Standing balance comment: initially had LOB posteriorly requiring min A with standing unsupported; steady with RW                             Pertinent  Vitals/Pain **pt denied pain with walking, refused ice following ambulation*    Home Living Family/patient expects to be discharged to:: Private residence Living Arrangements: Spouse/significant other;Children Available Help at Discharge: Family;Available 24 hours/day Type of Home: House Home Access: Stairs to enter Entrance Stairs-Rails: Left Entrance Stairs-Number of Steps: 2 Home Layout: Multi-level;Able to live on main level with bedroom/bathroom Home Equipment: Wheelchair - manual;Cane - single point;Crutches;Cane - quad;Bedside commode;Shower seat;Walker - 2 wheels;Grab bars - tub/shower;Hand held shower head;Grab bars - toilet;Adaptive equipment Additional Comments: handicapped height commode    Prior Function Level of Independence: Independent with assistive device(s)         Comments: used straight cane, supervision for in/out of walk in shower, assist to dry off after showering     Hand Dominance        Extremity/Trunk Assessment   Upper Extremity Assessment: Overall WFL for tasks assessed           Lower Extremity Assessment: RLE deficits/detail RLE Deficits / Details: knee flexion AROM 15* (near baseline per pt), SLR 3/5, ankle 5/5    Cervical / Trunk Assessment: Normal  Communication      Cognition Arousal/Alertness: Awake/alert Behavior During Therapy: WFL for tasks assessed/performed Overall Cognitive Status: Within Functional Limits for tasks assessed                      General Comments      Exercises Total Joint Exercises Ankle Circles/Pumps: AROM;Both;10 reps;Supine Heel Slides: AAROM;Right;10 reps;Supine  Hip ABduction/ADduction: AROM;Right;5 reps;Supine Straight Leg Raises: AROM;Right;5 reps;Supine      Assessment/Plan    PT Assessment Patient needs continued PT services  PT Diagnosis Difficulty walking   PT Problem List Decreased range of motion;Decreased activity tolerance;Decreased balance;Decreased mobility  PT  Treatment Interventions DME instruction;Gait training;Stair training;Functional mobility training;Therapeutic exercise;Therapeutic activities;Balance training;Patient/family education   PT Goals (Current goals can be found in the Care Plan section) Acute Rehab PT Goals Patient Stated Goal: to go home and be able to do for myself PT Goal Formulation: With patient/family Time For Goal Achievement: 01/01/14 Potential to Achieve Goals: Good    Frequency Min 5X/week   Barriers to discharge        Co-evaluation               End of Session Equipment Utilized During Treatment: Gait belt Activity Tolerance: Patient tolerated treatment well Patient left: in chair;with call bell/phone within reach;with family/visitor present Nurse Communication: Mobility status         Time: 1135-1156 PT Time Calculation (min): 21 min   Charges:     PT Treatments $Gait Training: 8-22 mins   PT G CodesLucile Crater 12/18/2013, 12:23 PM [phone]

## 2013-12-18 NOTE — Progress Notes (Signed)
ANTICOAGULATION CONSULT NOTE - Follow Up  Pharmacy Consult for warfarin Indication: afib/VTE prophylaxis  Allergies  Allergen Reactions  . Codeine Nausea And Vomiting  . Morphine And Related Nausea And Vomiting  . Requip [Ropinirole Hcl]     Headache   . Zinc     nausea   Patient Measurements: Height: 5\' 1"  (154.9 cm) Weight: 142 lb 8 oz (64.638 kg) IBW/kg (Calculated) : 47.8  Vital Signs: Temp: 98 F (36.7 C) (04/14 0944) Temp src: Oral (04/14 0944) BP: 107/63 mmHg (04/14 0944) Pulse Rate: 64 (04/14 0944)  Labs:  Recent Labs  12/17/13 1433 12/17/13 2113 12/18/13 0412  HGB  --   --  12.4  HCT  --   --  36.1  PLT  --   --  188  LABPROT 12.6  --  12.5  INR 0.96  --  0.95  CREATININE  --  1.11* 1.22*   Assessment: 53 YOF admitted for I&D with poly exchange of R knee due to prosthetic joint infection. She is on warfarin prior to admission for afib.  INR day of surgery was 0.96.  Stated home dose is 2mg  daily except 1mg  on Wed/Fri  Amiodarone also home medication do not expect any effect on INR  On Lovenox 30mg  SQ daily until INR therapeutic. Also on celebrex  INR 0.95 after 3 mg Warfarin last night  Goal of Therapy:  INR 2-3 Monitor platelets by anticoagulation protocol: Yes   Plan:   Warfarin 3mg  po x 1 tonight  Daily INR  Continue enoxaparin until INR >= 1.8  Minda Ditto PharmD Pager [phone] 12/18/2013, 11:12 AM

## 2013-12-18 NOTE — Progress Notes (Signed)
Occupational Therapy Evaluation Patient Details Name: [name] MRN: [id number] DOB: 01-24-1934 Today's Date: 12/18/2013    History of Present Illness I&D R knee with poly exchange   Clinical Impression   Pt making excellent progress. Pt has all DME/AE and will have 24/7 S for safe D/C home. Discussed home safety and fall reduction with pt/daughter, who verbalized understanding. OT signing off. Thank you.     Follow Up Recommendations  No OT follow up;Supervision/Assistance - 24 hour (initially)    Equipment Recommendations  None recommended by OT    Recommendations for Other Services       Precautions / Restrictions Precautions Precautions: Fall Restrictions Weight Bearing Restrictions: No Other Position/Activity Restrictions: WBAT      Mobility Bed Mobility                  Transfers Overall transfer level: Needs assistance Equipment used: Rolling walker (2 wheeled) Transfers: Sit to/from Stand Sit to Stand: Min assist Stand pivot transfers: Min guard       General transfer comment: min A for initial posterior lean in standing    Balance                                            ADL Overall ADL's : Needs assistance/impaired         Upper Body Bathing: Minimal assitance;Sitting   Lower Body Bathing: Minimal assistance;Sit to/from stand       Lower Body Dressing: Minimal assistance;Sit to/from stand   Toilet Transfer: Min guard           Functional mobility during ADLs: Min guard;Rolling walker General ADL Comments: Educated pt/daughter on home safety and reducing falls sp knee surgery.      Vision                     Perception     Praxis      Pertinent Vitals/Pain no apparent distress      Hand Dominance     Extremity/Trunk Assessment Upper Extremity Assessment Upper Extremity Assessment: Overall WFL for tasks assessed   Lower Extremity Assessment Lower Extremity Assessment: RLE  deficits/detail RLE Deficits / Details: knee flexion AROM 15* (near baseline per pt), SLR 3/5, ankle 5/5   Cervical / Trunk Assessment Cervical / Trunk Assessment: Normal   Communication     Cognition Arousal/Alertness: Awake/alert Behavior During Therapy: WFL for tasks assessed/performed Overall Cognitive Status: Within Functional Limits for tasks assessed                     General Comments       Exercises       Shoulder Instructions      Home Living Family/patient expects to be discharged to:: Private residence Living Arrangements: Spouse/significant other;Children Available Help at Discharge: Family;Available 24 hours/day Type of Home: House Home Access: Stairs to enter [employer] of Steps: 2 Entrance Stairs-Rails: Left Home Layout: Multi-level;Able to live on main level with bedroom/bathroom Alternate Level Stairs-Number of Steps: 6 Alternate Level Stairs-Rails: Left Bathroom Shower/Tub: Occupational psychologist: Handicapped height Bathroom Accessibility: Yes How Accessible: Accessible via walker Home Equipment: Wheelchair - manual;Cane - single point;Crutches;Cane - quad;Bedside commode;Shower seat;Walker - 2 wheels;Grab bars - tub/shower;Hand held shower head;Grab bars - toilet;Adaptive equipment   Additional Comments: handicapped height commode  Prior Functioning/Environment Level of Independence: Independent with assistive device(s)        Comments: used straight cane, supervision for in/out of walk in shower, assist to dry off after showering    OT Diagnosis:     OT Problem List:     OT Treatment/Interventions:      OT Goals(Current goals can be found in the care plan section) Acute Rehab OT Goals Patient Stated Goal: to go home and be able to do for myself OT Goal Formulation: With patient  OT Frequency:     Barriers to D/C:            Co-evaluation              End of Session Equipment Utilized  During Treatment: Gait belt;Rolling walker Nurse Communication: Mobility status  Activity Tolerance: Patient tolerated treatment well Patient left: in chair;with call bell/phone within reach;with family/visitor present   Time: 1159-1209 OT Time Calculation (min): 10 min Charges:  OT General Charges $OT Visit: 1 Procedure OT Evaluation $Initial OT Evaluation Tier I: 1 Procedure G-Codes:    Roney Jaffe Marsena Taff 12/27/13, 12:19 PM  [facility], OTR/L  [phone] 2013/12/27

## 2013-12-18 NOTE — Anesthesia Postprocedure Evaluation (Signed)
  Anesthesia Post-op Note  Patient: [name]  Procedure(s) Performed: Procedure(s) (LRB): IRRIGATION AND DEBRIDEMEN RIGHT TOTAL  KNEE WITH POLY EXCHANGE (Right)  Patient Location: PACU  Anesthesia Type: General  Level of Consciousness: awake and alert   Airway and Oxygen Therapy: Patient Spontanous Breathing  Post-op Pain: mild  Post-op Assessment: Post-op Vital signs reviewed, Patient's Cardiovascular Status Stable, Respiratory Function Stable, Patent Airway and No signs of Nausea or vomiting  Last Vitals:  Filed Vitals:   12/18/13 0755  BP:   Pulse:   Temp:   Resp: 16    Post-op Vital Signs: stable   Complications: No apparent anesthesia complications

## 2013-12-18 NOTE — Progress Notes (Signed)
[facility]  [facility] is providing the following services: declined rw and commode.  Has both at home.   If patient discharges after hours, please call [phone].   Linward Headland 12/18/2013, 11:46 AM

## 2013-12-19 LAB — PROTIME-INR
INR: 1.27 (ref 0.00–1.49)
Prothrombin Time: 15.6 seconds — ABNORMAL HIGH (ref 11.6–15.2)

## 2013-12-19 LAB — CBC
HCT: 28.6 % — ABNORMAL LOW (ref 36.0–46.0)
HEMOGLOBIN: 9.9 g/dL — AB (ref 12.0–15.0)
MCH: 31.1 pg (ref 26.0–34.0)
MCHC: 34.6 g/dL (ref 30.0–36.0)
MCV: 89.9 fL (ref 78.0–100.0)
PLATELETS: 173 10*3/uL (ref 150–400)
RBC: 3.18 MIL/uL — ABNORMAL LOW (ref 3.87–5.11)
RDW: 14.2 % (ref 11.5–15.5)
WBC: 8.2 10*3/uL (ref 4.0–10.5)

## 2013-12-19 LAB — BASIC METABOLIC PANEL
BUN: 34 mg/dL — ABNORMAL HIGH (ref 6–23)
CALCIUM: 8.1 mg/dL — AB (ref 8.4–10.5)
CO2: 23 mEq/L (ref 19–32)
Chloride: 108 mEq/L (ref 96–112)
Creatinine, Ser: 1.48 mg/dL — ABNORMAL HIGH (ref 0.50–1.10)
GFR calc non Af Amer: 33 mL/min — ABNORMAL LOW (ref >90)
GFR, EST AFRICAN AMERICAN: 38 mL/min — AB (ref >90)
Glucose, Bld: 114 mg/dL — ABNORMAL HIGH (ref 70–99)
POTASSIUM: 4.3 meq/L (ref 3.7–5.3)
SODIUM: 140 meq/L (ref 137–147)

## 2013-12-19 MED ORDER — HYDROCODONE-ACETAMINOPHEN 7.5-325 MG PO TABS: 1.0000 | ORAL_TABLET | ORAL | Status: DC

## 2013-12-19 MED ORDER — DSS 100 MG PO CAPS: 100.0000 mg | ORAL_CAPSULE | Freq: Two times a day (BID) | ORAL | Status: DC

## 2013-12-19 MED ORDER — POLYETHYLENE GLYCOL 3350 17 G PO PACK: 17.0000 g | PACK | Freq: Two times a day (BID) | ORAL | Status: DC

## 2013-12-19 MED ORDER — FERROUS SULFATE 325 (65 FE) MG PO TABS: 325.0000 mg | ORAL_TABLET | Freq: Three times a day (TID) | ORAL | Status: DC

## 2013-12-19 MED ORDER — VANCOMYCIN HCL IN DEXTROSE 1-5 GM/200ML-% IV SOLN: 1000.0000 mg | INTRAVENOUS | Status: DC

## 2013-12-19 MED ORDER — WARFARIN SODIUM 3 MG PO TABS
3.0000 mg | ORAL_TABLET | Freq: Once | ORAL | Status: AC
  Administered 2013-12-19: 3 mg via ORAL
  Filled 2013-12-19: qty 1

## 2013-12-19 MED ORDER — WARFARIN SODIUM 2 MG PO TABS: 1.0000 mg | ORAL_TABLET | Freq: Every day | ORAL | Status: DC

## 2013-12-19 MED ORDER — HEPARIN SOD (PORK) LOCK FLUSH 100 UNIT/ML IV SOLN
250.0000 [IU] | INTRAVENOUS | Status: AC | PRN
  Administered 2013-12-19: 250 [IU]

## 2013-12-19 MED ORDER — ENOXAPARIN SODIUM 40 MG/0.4ML ~~LOC~~ SOLN: 40.0000 mg | Freq: Every day | SUBCUTANEOUS | Status: DC

## 2013-12-19 MED ORDER — TIZANIDINE HCL 4 MG PO CAPS: 4.0000 mg | ORAL_CAPSULE | Freq: Three times a day (TID) | ORAL | Status: DC | PRN

## 2013-12-19 NOTE — Progress Notes (Signed)
ANTICOAGULATION CONSULT NOTE - Follow Up  Pharmacy Consult for warfarin Indication: afib/VTE prophylaxis  Allergies  Allergen Reactions  . Codeine Nausea And Vomiting  . Morphine And Related Nausea And Vomiting  . Requip [Ropinirole Hcl]     Headache   . Zinc     nausea   Patient Measurements: Height: 5\' 1"  (154.9 cm) Weight: 142 lb 8 oz (64.638 kg) IBW/kg (Calculated) : 47.8  Vital Signs: Temp: 98.2 F (36.8 C) (04/15 1400) Temp src: Oral (04/15 1400) BP: 106/66 mmHg (04/15 1400) Pulse Rate: 66 (04/15 1400)  Labs:  Recent Labs  12/17/13 1433 12/17/13 2113 12/18/13 0412 12/19/13 0435  HGB  --   --  12.4 9.9*  HCT  --   --  36.1 28.6*  PLT  --   --  188 173  LABPROT 12.6  --  12.5 15.6*  INR 0.96  --  0.95 1.27  CREATININE  --  1.11* 1.22* 1.48*   Assessment: 58 YOF admitted for I&D with poly exchange of R knee due to prosthetic joint infection. She is on warfarin prior to admission for afib.  INR day of surgery was 0.96.  Stated home dose is 2mg  daily except 1mg  on Wed/Fri  Amiodarone also home medication do not expect any effect on INR  On Lovenox until INR therapeutic. Dose changed from 30mg  to 40mg  on 4/14. Also on Celebrex.  INR up to 1.27 after 3mg  x 2 days. Planning discharge today.   Goal of Therapy:  INR 2-3 Monitor platelets by anticoagulation protocol: Yes   Plan:   If not discharged by 18:00, give warfarin 3mg  po x 1.  Daily INR.  Continue enoxaparin until INR >= 1.8  Romeo Rabon, PharmD, pager [phone]. 12/19/2013,2:50 PM.

## 2013-12-19 NOTE — Progress Notes (Signed)
Physical Therapy Treatment Patient Details Name: [name] MRN: [id number] DOB: 03/06/1934 Today's Date: 01/08/2014    History of Present Illness I&D R knee with poly exchange    PT Comments    Progressing well and ready for d/c home from PT perspective  Follow Up Recommendations  Home health PT     Equipment Recommendations  None recommended by PT    Recommendations for Other Services       Precautions / Restrictions Precautions Precautions: Fall Restrictions Weight Bearing Restrictions: No Other Position/Activity Restrictions: WBAT    Mobility  Bed Mobility Overal bed mobility: Modified Independent                Transfers Overall transfer level: Needs assistance Equipment used: Rolling walker (2 wheeled) Transfers: Sit to/from Stand Sit to Stand: Min guard         General transfer comment: cues for transition position and use of UEs to self assist  Ambulation/Gait Ambulation/Gait assistance: Min guard;Supervision Ambulation Distance (Feet): 150 Feet (twice) Assistive device: Rolling walker (2 wheeled) Gait Pattern/deviations: Step-through pattern;Shuffle;Trunk flexed Gait velocity: WFL   General Gait Details: cues for posture and position from RW   Stairs Stairs: Yes Stairs assistance: Min guard Stair Management: One rail Right;Step to pattern;Forwards;With crutches Number of Stairs: 4    Wheelchair Mobility    Modified Rankin (Stroke Patients Only)       Balance                                    Cognition Arousal/Alertness: Awake/alert Behavior During Therapy: WFL for tasks assessed/performed Overall Cognitive Status: Within Functional Limits for tasks assessed                      Exercises Total Joint Exercises Ankle Circles/Pumps: AROM;Both;10 reps;Supine Quad Sets: AROM;Both;15 reps;Supine Heel Slides: AAROM;Right;Supine;15 reps Straight Leg Raises: AROM;Right;Supine;15 reps Goniometric  ROM: AAROM at R knee -4 - 30    General Comments        Pertinent Vitals/Pain Min c/o pain    Home Living                      Prior Function            PT Goals (current goals can now be found in the care plan section) Acute Rehab PT Goals Patient Stated Goal: to go home and be able to do for myself PT Goal Formulation: With patient/family Time For Goal Achievement: 01/01/14 Potential to Achieve Goals: Good Progress towards PT goals: Progressing toward goals    Frequency  Min 5X/week    PT Plan Current plan remains appropriate    Co-evaluation             End of Session Equipment Utilized During Treatment: Gait belt Activity Tolerance: Patient tolerated treatment well Patient left: in bed;with call bell/phone within reach;with family/visitor present     Time: 0820-0850 PT Time Calculation (min): 30 min  Charges:  $Gait Training: 8-22 mins $Therapeutic Exercise: 8-22 mins                    G Codes:      Mathis Fare 01-08-2014, 8:58 AM

## 2013-12-19 NOTE — Progress Notes (Signed)
RN reviewed discharge instructions with patient and daughter. All questions answered.   Paperwork and prescriptions given.   RN wheeled patient down to family car.

## 2013-12-19 NOTE — Progress Notes (Signed)
CARE MANAGEMENT NOTE 12/19/2013  Patient:  [name], [name]   Account Number:  [id number]  Date Initiated:  12/19/2013  Documentation initiated by:  Cashel Bellina  Subjective/Objective Assessment:   pt had irrigation and debridement of infected right knee from previous surg.     Action/Plan:   iv abx and labs for coumadin-pt is set up with [facility] for care.   Anticipated DC Date:  12/19/2013   Anticipated DC Plan:  [place]  In-house referral  NA      DC Planning Services  CM consult      [facility] Choice  NA   Choice offered to / List presented to:  C-1 Patient      DME agency  NA     [place] arranged  HH-1 RN  [place]   Status of service:  Completed, signed off Medicare Important Message given?  NA - LOS <3 / Initial given by admissions (If response is "NO", the following Medicare IM given date fields will be blank) Date Medicare IM given:   Date Additional Medicare IM given:    Discharge Disposition:  [place]  Per UR Regulation:  Reviewed for med. necessity/level of care/duration of stay  If discussed at [place] of Stay Meetings, dates discussed:    Comments:  [id number]/Natividad Halls,Rn,BSn,CCM?pt is ready for dcd has iv picc for iv abx/[facility] is handling [facility]hc and iv abx.

## 2013-12-19 NOTE — Progress Notes (Signed)
   Subjective: 2 Days Post-Op Procedure(s) (LRB): IRRIGATION AND DEBRIDEMEN RIGHT TOTAL  KNEE WITH POLY EXCHANGE (Right)   Patient reports pain as mild, pain controlled. No events throughout the night. Discussed the treatment of antibiotics and watching her coumadin until her INR is leveled. Ready to be discharged home.  Objective:   VITALS:   Filed Vitals:   12/19/13  BP: 146/74  Pulse: 96  Temp: 98.4 F (36.9 C)   Resp: 18    Neurovascular intact Dorsiflexion/Plantar flexion intact Incision: dressing C/D/I No cellulitis present Compartment soft  LABS  Recent Labs  12/18/13 0412 12/19/13 0435  HGB 12.4 9.9*  HCT 36.1 28.6*  WBC 8.1 8.2  PLT 188 173     Recent Labs  12/17/13 2113 12/18/13 0412 12/19/13 0435  NA  --  136* 140  K  --  3.8 4.3  BUN  --  27* 34*  CREATININE 1.11* 1.22* 1.48*  GLUCOSE  --  126* 114*     Assessment/Plan: 2 Days Post-Op Procedure(s) (LRB): IRRIGATION AND DEBRIDEMEN RIGHT TOTAL  KNEE WITH POLY EXCHANGE (Right) Up with therapy Discharge home with home health Follow up in 2 weeks at [facility]. Follow up with OLIN,Shellsea Borunda D in 2 weeks.  Contact information:  [facility] [address], [place] [phone]    Overweight (BMI 25-29.9)  Estimated body mass index is 26.94 kg/(m^2) as calculated from the following:      Height as of this encounter: 5\' 1"  (1.549 m).      Weight as of this encounter: 64.638 kg (142 lb 8 oz).  Patient also counseled that weight may inhibit the healing process  Patient counseled that losing weight will help with future health issues       West Pugh. Fathima Bartl   PAC  12/19/2013, 9:00 AM

## 2013-12-20 ENCOUNTER — Ambulatory Visit (INDEPENDENT_AMBULATORY_CARE_PROVIDER_SITE_OTHER): Payer: 59 | Admitting: Internal Medicine

## 2013-12-20 DIAGNOSIS — I4891 Unspecified atrial fibrillation: Secondary | ICD-10-CM

## 2013-12-20 LAB — POCT INR: INR: 2.1

## 2013-12-21 LAB — BODY FLUID CULTURE: CULTURE: NO GROWTH

## 2013-12-22 LAB — ANAEROBIC CULTURE

## 2013-12-24 ENCOUNTER — Ambulatory Visit (INDEPENDENT_AMBULATORY_CARE_PROVIDER_SITE_OTHER): Payer: Medicare Other | Admitting: Cardiology

## 2013-12-24 DIAGNOSIS — I4891 Unspecified atrial fibrillation: Secondary | ICD-10-CM

## 2013-12-24 LAB — POCT INR: INR: 1.5

## 2013-12-24 NOTE — Discharge Summary (Signed)
Physician Discharge Summary  Patient ID: BRECKYN TROYER MRN: [id number] DOB/AGE: October 16, 1933 80 y.o.  Admit date: 12/17/2013 Discharge date: 12/19/2013   Procedures:  Procedure(s) (LRB): IRRIGATION AND DEBRIDEMEN RIGHT TOTAL  KNEE WITH POLY EXCHANGE (Right)  Attending Physician:  Dr. Paralee Cancel   Admission Diagnoses:   Right knee pain s/p TKA  Discharge Diagnoses:  Active Problems:   Infection of prosthetic knee joint   Overweight (BMI 25.0-29.9)  Past Medical History  Diagnosis Date  . PAF (paroxysmal atrial fibrillation)     controlled with amiodarone, on coumadin  . CAD (coronary artery disease)   . Aortic root dilatation   . Moderate aortic stenosis   . Chronic anticoagulation   . Tachycardia-bradycardia syndrome     s/p PPM by Dr Doreatha Lew (MDT) 07/03/10  . GERD (gastroesophageal reflux disease)   . Chronic kidney disease   . Pacemaker     2012  . History of nephrectomy     left - due to kidney cancer  . Neuropathy     feet/legs  . Restless leg syndrome   . Headache(784.0)     hx migraines - takes Zoloft for migraines  . History of uterine cancer   . Cancer     hx Kidney cancer / hx of endometrial cancer  . PONV (postoperative nausea and vomiting)   . Anxiety   . Heart murmur   . HTN (hypertension)   . Hyperlipemia   . Hypothyroidism   . 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 Osteomyelitis as child  . Family history of anesthesia complication     daughter has ponv    HPI: Pt is a 80 y.o. female complaining of right pain. Lily comes in for evaluation of her right total knee. She has a previous right total knee arthroplasty on 02/07/2012 by Dr. Alvan Dame. On 3/21 she and her daughter were at [facility] shopping when she had a sharp pain come on in her knee. It continued to bother her and caused her pain and discomfort. It hurt when she put weight on it. They got home. She elevated it, iced it, and started using crutches. She went to [place] the next day where she was seen  and evaluated. X-rays were obtained and revealed no acute bony abnormalities. They did a CBC on her which showed an elevated white count of 16.4. with a slightly high granular count at 80.8, otherwise normal limits. She was placed on Cipro and told to follow up with [place] for further evaluation. Note that she is on Coumadin for A-fib. She does bruise easily, but she had no injury to her knee that she is aware of. She has been running an occasional low grade fever, but no chills, fevers, or constitutional symptoms. The knee has been a little bit warm since this occurred. Risks, benefits and expectations were discussed with the patient. Risks including but not limited to the risk of anesthesia, blood clots, nerve damage, blood vessel damage, failure of the prosthesis, infection and up to and including death. Patient understand the risks, benefits and expectations and wishes to proceed with surgery.   PCP: [name], MD   Discharged Condition: good  Hospital Course:  Patient underwent the above stated procedure on 12/17/2013. Patient tolerated the procedure well and brought to the recovery room in good condition and subsequently to the floor.  POD #1 BP: 122/77 ; Pulse: 62 ; Temp: 97.6 F (36.4 C) ; Resp: 18  Patient reports pain as mild, pain controlled.  No events throughout the night. Discussed findings and plan. To get a PICC line today and Vanco adjusted by pharmacy. Neurovascular intact, dorsiflexion/plantar flexion intact, incision: dressing C/D/I, no cellulitis present and compartment soft.   LABS  Basename    HGB  12.4  HCT  36.1   POD #2  BP: 146/74 ; Pulse: 96 ; Temp: 98.4 F (36.9 C) ; Resp: 18  Patient reports pain as mild, pain controlled. No events throughout the night. Discussed the treatment of antibiotics and watching her coumadin until her INR is leveled. Ready to be discharged home. Neurovascular intact, dorsiflexion/plantar flexion intact, incision: dressing C/D/I, no  cellulitis present and compartment soft.   LABS  Basename    HGB  9.9  HCT  28.6    Discharge Exam: General appearance: alert, cooperative and no distress Extremities: Homans sign is negative, no sign of DVT, no edema, redness or tenderness in the calves or thighs and no ulcers, gangrene or trophic changes  Disposition:    Home or Self Care with follow up in 2 weeks   Follow-up Information   Follow up with Mauri Pole, MD. Schedule an appointment as soon as possible for a visit in 2 weeks.   Specialty:  Orthopedic Surgery   Contact information:   [address] [place] [address] [place] [address] [phone]       Discharge Orders   Future Appointments Provider Department Dept Phone   01/03/2014 11:15 AM Vicie Mutters, [place] [facility] [phone]   01/23/2014 2:30 PM Thompson Grayer, MD [place] Office [phone]   04/02/2014 2:30 PM [name], MD [place] [facility] [phone]   09/25/2014 10:00 AM [name], MD  [facility] [phone]   Future Orders Complete By Expires   Call MD / Call 911  As directed    Change dressing  As directed    Constipation Prevention  As directed    Diet - low sodium heart healthy  As directed    Discharge instructions  As directed    Driving restrictions  As directed    Increase activity slowly as tolerated  As directed    TED hose  As directed    Weight bearing as tolerated  As directed    Questions:     Laterality:     Extremity:          Medication List    STOP taking these medications       acetaminophen 325 MG tablet  Commonly known as:  TYLENOL      TAKE these medications       amiodarone 200 MG tablet  Commonly known as:  PACERONE  Take 200 mg by mouth every morning.     atenolol 100 MG tablet  Commonly known as:  TENORMIN  Take 100 mg by mouth 2 (two) times daily.     atorvastatin 40  MG tablet  Commonly known as:  LIPITOR  Take 20 mg by mouth See admin instructions. Takes 0.5 tablet Monday-Friday     Biotin 1000 MCG tablet  Take 1,000 mcg by mouth every morning.     cholecalciferol 1000 UNITS tablet  Commonly known as:  VITAMIN D  Take 2,000 Units by mouth every morning.     DSS 100 MG Caps  Take 100 mg by mouth 2 (two) times daily.     enoxaparin 40 MG/0.4ML injection  Commonly known as:  LOVENOX  Inject 0.4 mLs (40 mg  total) into the skin daily.     famotidine 10 MG tablet  Commonly known as:  PEPCID  Take 10 mg by mouth every morning. For acid reducer     ferrous sulfate 325 (65 FE) MG tablet  Take 1 tablet (325 mg total) by mouth 3 (three) times daily after meals.     FLORA-Q Caps capsule  Take 1 capsule by mouth 2 (two) times daily.     furosemide 40 MG tablet  Commonly known as:  LASIX  Take 1 tablet (40 mg total) by mouth daily.     HYDROcodone-acetaminophen 7.5-325 MG per tablet  Commonly known as:  NORCO  Take 1-2 tablets by mouth every 4 (four) hours.     levothyroxine 100 MCG tablet  Commonly known as:  SYNTHROID, LEVOTHROID  Take 50 mcg by mouth daily.     losartan-hydrochlorothiazide 100-25 MG per tablet  Commonly known as:  HYZAAR  Take 0.5 tablets by mouth every morning.     Magnesium 250 MG Tabs  Take 1 tablet by mouth every Monday, Wednesday, and Friday.     nitroGLYCERIN 0.4 MG SL tablet  Commonly known as:  NITROSTAT  Place 0.4 mg under the tongue every 5 (five) minutes as needed for chest pain.     polyethylene glycol packet  Commonly known as:  MIRALAX / GLYCOLAX  Take 17 g by mouth 2 (two) times daily.     potassium chloride SA 20 MEQ tablet  Commonly known as:  K-DUR,KLOR-CON  Take 1 tablet (20 mEq total) by mouth 2 (two) times daily.     pregabalin 75 MG capsule  Commonly known as:  LYRICA  Take 75 mg by mouth 2 (two) times daily.     sertraline 50 MG tablet  Commonly known as:  ZOLOFT  Take 50 mg by mouth  every evening.     tiZANidine 4 MG capsule  Commonly known as:  ZANAFLEX  Take 1 capsule (4 mg total) by mouth 3 (three) times daily as needed for muscle spasms.     vancomycin 1 GM/200ML Soln  Commonly known as:  VANCOCIN  Inject 200 mLs (1,000 mg total) into the vein daily.     warfarin 2 MG tablet  Commonly known as:  COUMADIN  Take 0.5-1 tablets (1-2 mg total) by mouth daily. Wednesday and Friday takes 1mg . Takes 2 mg all other days         Signed: West Pugh. Maleiyah Releford   PAC  12/24/2013, 4:45 PM

## 2013-12-27 ENCOUNTER — Ambulatory Visit (INDEPENDENT_AMBULATORY_CARE_PROVIDER_SITE_OTHER): Payer: 59 | Admitting: Cardiovascular Disease

## 2013-12-27 ENCOUNTER — Ambulatory Visit: Payer: Self-pay | Admitting: Physician Assistant

## 2013-12-27 ENCOUNTER — Other Ambulatory Visit: Payer: Self-pay | Admitting: Internal Medicine

## 2013-12-27 DIAGNOSIS — I4891 Unspecified atrial fibrillation: Secondary | ICD-10-CM

## 2013-12-27 LAB — POCT INR: INR: 2.5

## 2013-12-31 ENCOUNTER — Ambulatory Visit (INDEPENDENT_AMBULATORY_CARE_PROVIDER_SITE_OTHER): Payer: 59 | Admitting: Cardiology

## 2013-12-31 DIAGNOSIS — I4891 Unspecified atrial fibrillation: Secondary | ICD-10-CM

## 2013-12-31 LAB — POCT INR: INR: 3

## 2014-01-03 ENCOUNTER — Ambulatory Visit (INDEPENDENT_AMBULATORY_CARE_PROVIDER_SITE_OTHER): Payer: 59 | Admitting: Internal Medicine

## 2014-01-03 ENCOUNTER — Ambulatory Visit: Payer: Self-pay | Admitting: Physician Assistant

## 2014-01-03 DIAGNOSIS — I4891 Unspecified atrial fibrillation: Secondary | ICD-10-CM

## 2014-01-03 LAB — POCT INR: INR: 3

## 2014-01-07 ENCOUNTER — Ambulatory Visit (INDEPENDENT_AMBULATORY_CARE_PROVIDER_SITE_OTHER): Payer: 59 | Admitting: Pharmacist

## 2014-01-07 DIAGNOSIS — I4891 Unspecified atrial fibrillation: Secondary | ICD-10-CM

## 2014-01-07 LAB — POCT INR: INR: 2.6

## 2014-01-10 ENCOUNTER — Ambulatory Visit (INDEPENDENT_AMBULATORY_CARE_PROVIDER_SITE_OTHER): Payer: 59 | Admitting: Cardiovascular Disease

## 2014-01-10 DIAGNOSIS — I4891 Unspecified atrial fibrillation: Secondary | ICD-10-CM

## 2014-01-10 LAB — POCT INR: INR: 2.5

## 2014-01-12 ENCOUNTER — Other Ambulatory Visit: Payer: Self-pay | Admitting: Internal Medicine

## 2014-01-15 ENCOUNTER — Telehealth: Payer: Self-pay | Admitting: *Deleted

## 2014-01-15 ENCOUNTER — Ambulatory Visit (INDEPENDENT_AMBULATORY_CARE_PROVIDER_SITE_OTHER): Payer: 59 | Admitting: Pharmacist Clinician (PhC)/ Clinical Pharmacy Specialist

## 2014-01-15 DIAGNOSIS — I4891 Unspecified atrial fibrillation: Secondary | ICD-10-CM

## 2014-01-15 LAB — POCT INR: INR: 2.8

## 2014-01-15 NOTE — Telephone Encounter (Signed)
pts daughter [name] is calling wanting to know if we could switch pt to the same blood thinner that Dad "[name] "is on so they can not have to go to the coumadin clinic qmth. Per Dr Lucie Leather ok with it but is unable to change pt because we didn't start the coumadin he requests pt contact the Cardiologist & they will have to make that change.  Pts Daughter [name] aware

## 2014-01-23 ENCOUNTER — Encounter: Payer: 59 | Admitting: Internal Medicine

## 2014-01-30 ENCOUNTER — Encounter: Payer: Self-pay | Admitting: Internal Medicine

## 2014-01-30 ENCOUNTER — Ambulatory Visit (INDEPENDENT_AMBULATORY_CARE_PROVIDER_SITE_OTHER): Payer: 59 | Admitting: *Deleted

## 2014-01-30 ENCOUNTER — Ambulatory Visit (INDEPENDENT_AMBULATORY_CARE_PROVIDER_SITE_OTHER): Payer: 59 | Admitting: Internal Medicine

## 2014-01-30 VITALS — BP 128/75 | HR 70 | Ht 61.0 in | Wt 144.2 lb

## 2014-01-30 DIAGNOSIS — I4891 Unspecified atrial fibrillation: Secondary | ICD-10-CM

## 2014-01-30 DIAGNOSIS — I10 Essential (primary) hypertension: Secondary | ICD-10-CM

## 2014-01-30 DIAGNOSIS — Z950 Presence of cardiac pacemaker: Secondary | ICD-10-CM

## 2014-01-30 DIAGNOSIS — I495 Sick sinus syndrome: Secondary | ICD-10-CM

## 2014-01-30 LAB — CBC WITH DIFFERENTIAL/PLATELET
Basophils Absolute: 0.1 10*3/uL (ref 0.0–0.1)
Basophils Relative: 0.8 % (ref 0.0–3.0)
Eosinophils Absolute: 0.6 10*3/uL (ref 0.0–0.7)
Eosinophils Relative: 6.3 % — ABNORMAL HIGH (ref 0.0–5.0)
HCT: 38 % (ref 36.0–46.0)
HEMOGLOBIN: 12.8 g/dL (ref 12.0–15.0)
Lymphocytes Relative: 17.9 % (ref 12.0–46.0)
Lymphs Abs: 1.6 10*3/uL (ref 0.7–4.0)
MCHC: 33.8 g/dL (ref 30.0–36.0)
MCV: 94.2 fl (ref 78.0–100.0)
MONOS PCT: 9.7 % (ref 3.0–12.0)
Monocytes Absolute: 0.9 10*3/uL (ref 0.1–1.0)
NEUTROS ABS: 5.8 10*3/uL (ref 1.4–7.7)
Neutrophils Relative %: 65.3 % (ref 43.0–77.0)
Platelets: 196 10*3/uL (ref 150.0–400.0)
RBC: 4.03 Mil/uL (ref 3.87–5.11)
RDW: 14.9 % (ref 11.5–15.5)
WBC: 8.9 10*3/uL (ref 4.0–10.5)

## 2014-01-30 LAB — MDC_IDC_ENUM_SESS_TYPE_INCLINIC
Battery Impedance: 244 Ohm
Battery Remaining Longevity: 111 mo
Battery Voltage: 2.8 V
Brady Statistic AP VP Percent: 0 %
Lead Channel Impedance Value: 498 Ohm
Lead Channel Pacing Threshold Pulse Width: 0.4 ms
Lead Channel Sensing Intrinsic Amplitude: 2 mV
Lead Channel Sensing Intrinsic Amplitude: 8 mV
Lead Channel Setting Pacing Amplitude: 2 V
Lead Channel Setting Pacing Amplitude: 2.5 V
Lead Channel Setting Pacing Pulse Width: 0.4 ms
MDC IDC MSMT LEADCHNL RA PACING THRESHOLD AMPLITUDE: 0.5 V
MDC IDC MSMT LEADCHNL RA PACING THRESHOLD PULSEWIDTH: 0.4 ms
MDC IDC MSMT LEADCHNL RV IMPEDANCE VALUE: 409 Ohm
MDC IDC MSMT LEADCHNL RV PACING THRESHOLD AMPLITUDE: 0.5 V
MDC IDC SESS DTM: 20150527114934
MDC IDC SET LEADCHNL RV SENSING SENSITIVITY: 4 mV
MDC IDC STAT BRADY AP VS PERCENT: 98 %
MDC IDC STAT BRADY AS VP PERCENT: 0 %
MDC IDC STAT BRADY AS VS PERCENT: 1 %

## 2014-01-30 LAB — BASIC METABOLIC PANEL
BUN: 39 mg/dL — ABNORMAL HIGH (ref 6–23)
CALCIUM: 9.2 mg/dL (ref 8.4–10.5)
CO2: 29 mEq/L (ref 19–32)
CREATININE: 1.9 mg/dL — AB (ref 0.4–1.2)
Chloride: 106 mEq/L (ref 96–112)
GFR: 27.85 mL/min — ABNORMAL LOW (ref >60.00)
GLUCOSE: 80 mg/dL (ref 70–99)
Potassium: 3 mEq/L — ABNORMAL LOW (ref 3.5–5.1)
SODIUM: 144 meq/L (ref 135–145)

## 2014-01-30 LAB — POCT INR: INR: 3.3

## 2014-01-30 LAB — HEPATIC FUNCTION PANEL
ALK PHOS: 102 U/L (ref 39–117)
ALT: 18 U/L (ref 0–35)
AST: 22 U/L (ref 0–37)
Albumin: 3.5 g/dL (ref 3.5–5.2)
BILIRUBIN TOTAL: 0.7 mg/dL (ref 0.2–1.2)
Bilirubin, Direct: 0.1 mg/dL (ref 0.0–0.3)
TOTAL PROTEIN: 6.7 g/dL (ref 6.0–8.3)

## 2014-01-30 LAB — MAGNESIUM: MAGNESIUM: 1.6 mg/dL (ref 1.5–2.5)

## 2014-01-30 LAB — TSH: TSH: 1.82 u[IU]/mL (ref 0.35–4.50)

## 2014-01-30 NOTE — Patient Instructions (Addendum)
Remote monitoring is used to monitor your pacemaker from home. This monitoring reduces the number of office visits required to check your device to one time per year. It allows [place] to keep an eye on the functioning of your device to ensure it is working properly. You are scheduled for a device check from home on 05-07-2014. You may send your transmission at any time that day. If you have a wireless device, the transmission will be sent automatically. After your physician reviews your transmission, you will receive a postcard with your next transmission date.  Your physician recommends that you schedule a follow-up appointment in:3 months with Dr Mare Ferrari and  12 months with Andi Hence

## 2014-01-30 NOTE — Progress Notes (Signed)
Primary Cardiologist:  Dr Mare Ferrari  [name] has a h/o atrial fibrillation and tachycardia/ bradycardia syndrome sp PPM (MDT) by Dr [name].  She presents today for follow-up in the Electrophysiology device clinic.  She is recovering from her recent knee surgery.  She has recently had nausea (2-3 weeks).  She also has had some dizziness. Today, she  denies symptoms of palpitations, chest pain, shortness of breath, orthopnea, PND, lower extremity edema, presyncope, syncope, or neurologic sequela.  The patientis tolerating medications without difficulties and is otherwise without complaint today.   Past Medical History  Diagnosis Date  . PAF (paroxysmal atrial fibrillation)     controlled with amiodarone, on coumadin  . CAD (coronary artery disease)   . Aortic root dilatation   . Moderate aortic stenosis   . Chronic anticoagulation   . Tachycardia-bradycardia syndrome     s/p PPM by Dr [name] (MDT) 07/03/10  . GERD (gastroesophageal reflux disease)   . Chronic kidney disease   . Pacemaker     2012  . History of nephrectomy     left - due to kidney cancer  . Neuropathy     feet/legs  . Restless leg syndrome   . Headache(784.0)     hx migraines - takes Zoloft for migraines  . History of uterine cancer   . Cancer     hx Kidney cancer / hx of endometrial cancer  . PONV (postoperative nausea and vomiting)   . Anxiety   . Heart murmur   . HTN (hypertension)   . Hyperlipemia   . Hypothyroidism   . 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 Osteomyelitis as child  . Family history of anesthesia complication     daughter has ponv    Past Surgical History  Procedure Laterality Date  . Cardiac catheterization  2008  . Nephrectomy    . Total abdominal hysterectomy    . Patent ductus arterious repair  age 36  . Ptvp    . Pacemaker insertion  07/04/11    by Dr [name]  . Cholecystectomy    . Upper gastrointestinal endoscopy    . Total knee arthroplasty  02/07/2012    Procedure: TOTAL KNEE  ARTHROPLASTY;  Surgeon: Valerie Pole, MD;  Location: [facility];  Service: Orthopedics;  Laterality: Right;  . Insert / replace / remove pacemaker    . Joint replacement      rt total hip 2009  . I&d knee with poly exchange Right 12/17/2013    Procedure: IRRIGATION AND DEBRIDEMEN RIGHT TOTAL  KNEE WITH POLY EXCHANGE;  Surgeon: Valerie Pole, MD;  Location: [facility];  Service: Orthopedics;  Laterality: Right;    History   Social History  . Marital Status: Married    Spouse Name: N/A    Number of Children: N/A  . Years of Education: N/A   Occupational History  . Not on file.   Social History Main Topics  . Smoking status: Never Smoker   . Smokeless tobacco: Never Used  . Alcohol Use: No  . Drug Use: No  . Sexual Activity: No   Other Topics Concern  . Not on file   Social History Narrative   Lives in [place].  Continues to work.    Family History  Problem Relation Age of Onset  . Colon cancer Mother 24  . Heart disease Mother   . Heart disease Father   . Heart attack Father   . Heart disease Maternal Grandmother  Allergies  Allergen Reactions  . Codeine Nausea And Vomiting  . Morphine And Related Nausea And Vomiting  . Requip [Ropinirole Hcl]     Headache   . Zinc     nausea    Current outpatient prescriptions:albuterol (PROVENTIL HFA;VENTOLIN HFA) 108 (90 BASE) MCG/ACT inhaler, Inhale 1 puff into the lungs every 6 (six) hours as needed for wheezing or shortness of breath., Disp: , Rfl: ;  amiodarone (PACERONE) 200 MG tablet, Take 200 mg by mouth every morning. , Disp: , Rfl: ;  atenolol (TENORMIN) 100 MG tablet, Take 100 mg by mouth 2 (two) times daily., Disp: , Rfl:  atorvastatin (LIPITOR) 40 MG tablet, Takes 0.5 tablet Monday-Friday, Disp: , Rfl: ;  Biotin 1000 MCG tablet, Take 10,000 mcg by mouth every morning. , Disp: , Rfl: ;  Cholecalciferol (VITAMIN D3) 2000 UNITS TABS, Take 1 tablet by mouth 2 (two) times daily., Disp: , Rfl: ;  Famotidine (PEPCID  PO), Take 150 mg by mouth daily., Disp: , Rfl: ;  ferrous sulfate 325 (65 FE) MG tablet, Take 325 mg by mouth 2 (two) times daily with a meal., Disp: , Rfl:  FLORA-Q (FLORA-Q) CAPS capsule, Take 1 capsule by mouth 2 (two) times daily. , Disp: , Rfl: ;  furosemide (LASIX) 40 MG tablet, Take 1 tablet (40 mg total) by mouth daily., Disp: 30 tablet, Rfl: 0;  levothyroxine (SYNTHROID, LEVOTHROID) 100 MCG tablet, TAKE 1/2 TABLET BY MOUTH EVERY DAY ON EMPTY STOMACH WITH A FULL GLASS OF WATER, Disp: , Rfl:  losartan-hydrochlorothiazide (HYZAAR) 100-25 MG per tablet, Take 0.5 tablets by mouth every morning., Disp: , Rfl: ;  Magnesium 250 MG TABS, Take 1 tablet by mouth every Monday, Wednesday, and Friday., Disp: , Rfl: ;  nitroGLYCERIN (NITROSTAT) 0.4 MG SL tablet, Place 0.4 mg under the tongue every 5 (five) minutes as needed for chest pain., Disp: , Rfl:  potassium chloride SA (K-DUR,KLOR-CON) 20 MEQ tablet, Take 1 tablet (20 mEq total) by mouth 2 (two) times daily., Disp: 60 tablet, Rfl: 0;  pregabalin (LYRICA) 75 MG capsule, Take 75 mg by mouth 2 (two) times daily., Disp: , Rfl: ;  sertraline (ZOLOFT) 50 MG tablet, Take 50 mg by mouth every evening., Disp: , Rfl:  tiZANidine (ZANAFLEX) 4 MG capsule, Take 1 capsule (4 mg total) by mouth 3 (three) times daily as needed for muscle spasms., Disp: 50 capsule, Rfl: 0;  warfarin (COUMADIN) 2 MG tablet, Take 0.5-1 tablets (1-2 mg total) by mouth daily. Wednesday and Friday takes 1mg . Takes 2 mg all other days, Disp: 60 tablet, Rfl: 0  Physical Exam: Filed Vitals:   01/30/14 1050  BP: 128/75  Pulse: 70  Height: 5\' 1"  (6.789 m)  Weight: 144 lb 3.2 oz (65.409 kg)    GEN- The patient is well appearing, alert and oriented x 3 today.   Head- normocephalic, atraumatic Eyes-  Sclera clear, conjunctiva pink Ears- hearing intact Oropharynx- clear Neck- supple,   Lungs- Clear to ausculation bilaterally, normal work of breathing Chest- R sided pacemaker pocket is well  healed Heart- Regular rate and rhythm, no murmurs, rubs or gallops, PMI not laterally displaced GI- soft, NT, ND, + BS Extremities- no clubbing, cyanosis, or edema  Device interrogation is reviewed and normal today  A/P 1. Tachy/brady syndrome Normal pacemaker function See Pace Art report No changes today  2. Afib Stable No change required today Check LFTs/TFTs today  3. Nausea/ dizziness Given recent surgery, will obtain cbc, bmet, and mg today  Return in 1 year to see [facility] Follow-up with Dr Mare Ferrari as scheduled carelink every 3 months

## 2014-02-04 ENCOUNTER — Telehealth: Payer: Self-pay | Admitting: Cardiology

## 2014-02-04 DIAGNOSIS — E876 Hypokalemia: Secondary | ICD-10-CM

## 2014-02-04 DIAGNOSIS — N289 Disorder of kidney and ureter, unspecified: Secondary | ICD-10-CM

## 2014-02-04 NOTE — Telephone Encounter (Signed)
New message ° ° ° ° ° ° ° ° ° °Pt calling about lab results °

## 2014-02-05 NOTE — Telephone Encounter (Signed)
Will increase her K+ from 2 daily to 3 daily and magnesium from 3 times a week to daily. Daughter advised and scheduled follow up labs Friday.  Patient was recently started on Doxycycline 100 mg twice a day and daughter concerned about this elevated BUN. Discussed with York Pellant D and ok to take until recheck Friday.   Notes Recorded by Thompson Grayer, MD on 01/31/2014 at 7:21 AM Results reviewed. Please inform pt of result. Hold lasix Increase potassium and add magnesium. Follow-up with PCP for repeat BMET on Monday.

## 2014-02-08 ENCOUNTER — Other Ambulatory Visit (INDEPENDENT_AMBULATORY_CARE_PROVIDER_SITE_OTHER): Payer: 59

## 2014-02-08 DIAGNOSIS — N289 Disorder of kidney and ureter, unspecified: Secondary | ICD-10-CM

## 2014-02-08 DIAGNOSIS — E876 Hypokalemia: Secondary | ICD-10-CM

## 2014-02-08 LAB — BASIC METABOLIC PANEL
BUN: 36 mg/dL — ABNORMAL HIGH (ref 6–23)
CO2: 27 mEq/L (ref 19–32)
Calcium: 9.3 mg/dL (ref 8.4–10.5)
Chloride: 112 mEq/L (ref 96–112)
Creatinine, Ser: 1.5 mg/dL — ABNORMAL HIGH (ref 0.4–1.2)
GFR: 36.54 mL/min — ABNORMAL LOW (ref >60.00)
Glucose, Bld: 75 mg/dL (ref 70–99)
Potassium: 4.1 mEq/L (ref 3.5–5.1)
Sodium: 143 mEq/L (ref 135–145)

## 2014-02-10 NOTE — Progress Notes (Signed)
Quick Note:  Please report to patient. The recent labs are stable. Continue same medication and careful diet. Potassium is good now. CSD. ______

## 2014-02-13 ENCOUNTER — Ambulatory Visit (INDEPENDENT_AMBULATORY_CARE_PROVIDER_SITE_OTHER): Payer: Medicare Other | Admitting: *Deleted

## 2014-02-13 ENCOUNTER — Telehealth: Payer: Self-pay | Admitting: *Deleted

## 2014-02-13 DIAGNOSIS — I4891 Unspecified atrial fibrillation: Secondary | ICD-10-CM

## 2014-02-13 LAB — POCT INR: INR: 1.5

## 2014-02-13 NOTE — Telephone Encounter (Signed)
Advised patient of lab results. Patient and daughter concerned since patient no longer taking Lasix, will forward to  Dr. Mare Ferrari for review

## 2014-02-13 NOTE — Telephone Encounter (Signed)
Okay to use the Lasix just when necessary for rapid weight gain or fluid retention but not on a daily basis.  She is on a diuretic in the form of her losartan HCT

## 2014-02-13 NOTE — Telephone Encounter (Signed)
Message copied by Earvin Hansen on Wed Feb 13, 2014 10:49 AM ------      Message from: Darlin Coco      Created: Sun Feb 10, 2014  7:28 PM       Please report to patient.  The recent labs are stable. Continue same medication and careful diet. Potassium is good now.  CSD. ------

## 2014-02-13 NOTE — Telephone Encounter (Signed)
Advised daughter, verbalized understanding  

## 2014-02-15 ENCOUNTER — Ambulatory Visit (INDEPENDENT_AMBULATORY_CARE_PROVIDER_SITE_OTHER): Payer: 59 | Admitting: Internal Medicine

## 2014-02-15 ENCOUNTER — Encounter: Payer: Self-pay | Admitting: Internal Medicine

## 2014-02-15 VITALS — BP 174/84 | HR 72 | Temp 97.9°F | Resp 16 | Ht 60.5 in | Wt 148.6 lb

## 2014-02-15 DIAGNOSIS — I10 Essential (primary) hypertension: Secondary | ICD-10-CM

## 2014-02-15 DIAGNOSIS — E785 Hyperlipidemia, unspecified: Secondary | ICD-10-CM

## 2014-02-15 DIAGNOSIS — Z79899 Other long term (current) drug therapy: Secondary | ICD-10-CM

## 2014-02-15 DIAGNOSIS — Z01810 Encounter for preprocedural cardiovascular examination: Secondary | ICD-10-CM | POA: Insufficient documentation

## 2014-02-15 DIAGNOSIS — E559 Vitamin D deficiency, unspecified: Secondary | ICD-10-CM

## 2014-02-15 DIAGNOSIS — R7309 Other abnormal glucose: Secondary | ICD-10-CM

## 2014-02-15 LAB — CBC WITH DIFFERENTIAL/PLATELET
BASOS PCT: 0 % (ref 0–1)
Basophils Absolute: 0 10*3/uL (ref 0.0–0.1)
EOS ABS: 0.5 10*3/uL (ref 0.0–0.7)
Eosinophils Relative: 6 % — ABNORMAL HIGH (ref 0–5)
HCT: 38 % (ref 36.0–46.0)
HEMOGLOBIN: 12.6 g/dL (ref 12.0–15.0)
Lymphocytes Relative: 20 % (ref 12–46)
Lymphs Abs: 1.6 10*3/uL (ref 0.7–4.0)
MCH: 31 pg (ref 26.0–34.0)
MCHC: 33.2 g/dL (ref 30.0–36.0)
MCV: 93.4 fL (ref 78.0–100.0)
MONOS PCT: 10 % (ref 3–12)
Monocytes Absolute: 0.8 10*3/uL (ref 0.1–1.0)
NEUTROS PCT: 64 % (ref 43–77)
Neutro Abs: 5 10*3/uL (ref 1.7–7.7)
PLATELETS: 169 10*3/uL (ref 150–400)
RBC: 4.07 MIL/uL (ref 3.87–5.11)
RDW: 14.5 % (ref 11.5–15.5)
WBC: 7.8 10*3/uL (ref 4.0–10.5)

## 2014-02-15 LAB — HEMOGLOBIN A1C
Hgb A1c MFr Bld: 5.6 % (ref <5.7)
Mean Plasma Glucose: 114 mg/dL (ref <117)

## 2014-02-15 MED ORDER — PROCHLORPERAZINE MALEATE 5 MG PO TABS: ORAL_TABLET | ORAL | Status: DC

## 2014-02-15 NOTE — Progress Notes (Signed)
Patient ID: [name], female   DOB: 1934-01-11, 80 y.o.   MRN: [id number]   This very nice 80 y.o.MWF presents for 3 month follow up with Hypertension, Hyperlipidemia, Pre-Diabetes and Vitamin D Deficiency. Other problems include Depression, Hypothyroidism, Hx/o Cancer of Kidney and Uterus.    In April patient has a re-Do Rt TKR (original in 02/2012) for suspected infection and currently is on long term bid Doxycycline with c/o ongoing nausea.   Patient has been hypertensive since the 1960's. In 2008 she was found to have Afib and Ht Cath was normal w/ EF 60%. In 2011 a pacemaker was placed for 3rd degree Heart Block. She has done well since that time. BP has been controlled and today's BP: 174/84 mmHg. Patient denies any cardiac type chest pain, palpitations, dyspnea/orthopnea/PND, dizziness, claudication, or dependent edema.   Hyperlipidemia is controlled with diet & meds. Last Lipids in January as below were at goal. Patient denies myalgias or other med SE's.  Lab Results  Component Value Date   CHOL 157 09/24/2013   HDL 43 09/24/2013   LDLCALC 87 09/24/2013   TRIG 135 09/24/2013   CHOLHDL 3.7 09/24/2013    Also, the patient has history of PreDiabetes/insulin resistance since 2012 with last A1c of 5.6% in Jan 2015. Patient denies any symptoms of reactive hypoglycemia, diabetic polys, paresthesias or visual blurring.   Further, Patient has history of Vitamin D Deficiency with last vitamin D of 77 in Jan 2015. Patient supplements vitamin D without any suspected side-effects.    Medication List   amiodarone 200 MG tablet  Commonly known as:  PACERONE  Take 200 mg by mouth every morning.     atenolol 100 MG tablet  Commonly known as:  TENORMIN  Take 100 mg by mouth 2 (two) times daily.     atorvastatin 40 MG tablet  Commonly known as:  LIPITOR  Takes 0.5 tablet Monday-Friday     Biotin 1000 MCG tablet  Take 10,000 mcg by mouth every morning.     FLORA-Q Caps capsule  Take 1  capsule by mouth 2 (two) times daily.     furosemide 40 MG tablet  Commonly known as:  LASIX  Take 40 mg by mouth as needed.     levothyroxine 100 MCG tablet  Commonly known as:  SYNTHROID, LEVOTHROID  TAKE 1/2 TABLET BY MOUTH EVERY DAY ON EMPTY STOMACH WITH A FULL GLASS OF WATER     losartan-hydrochlorothiazide 100-25 MG per tablet  Commonly known as:  HYZAAR  Take 0.5 tablets by mouth every morning.     Magnesium 250 MG Tabs  Take 1 tablet by mouth daily.     nitroGLYCERIN 0.4 MG SL tablet  Commonly known as:  NITROSTAT  Place 0.4 mg under the tongue every 5 (five) minutes as needed for chest pain.     PEPCID PO  Take 150 mg by mouth daily.     potassium chloride SA 20 MEQ tablet  Commonly known as:  K-DUR,KLOR-CON  Take 20 mEq by mouth 3 (three) times daily.     pregabalin 75 MG capsule  Commonly known as:  LYRICA  Take 75 mg by mouth 2 (two) times daily.     prochlorperazine 5 MG tablet  Commonly known as:  COMPAZINE  Take 1 tablet 3 x day before meals to prevent nausea     sertraline 50 MG tablet  Commonly known as:  ZOLOFT  Take 50 mg by mouth every evening.  tiZANidine 4 MG capsule  Commonly known as:  ZANAFLEX  Take 1 capsule (4 mg total) by mouth 3 (three) times daily as needed for muscle spasms.     Vitamin D3 2000 UNITS Tabs  Take 1 tablet by mouth 2 (two) times daily.     warfarin 2 MG tablet  Commonly known as:  COUMADIN  Take 0.5-1 tablets (1-2 mg total) by mouth daily. Wednesday and Friday takes 1mg . Takes 2 mg all other days        Allergies  Allergen Reactions  . Codeine Nausea And Vomiting  . Morphine And Related Nausea And Vomiting  . Requip [Ropinirole Hcl]     Headache   . Zinc     nausea   PMHx:   Past Medical History  Diagnosis Date  . PAF (paroxysmal atrial fibrillation)     controlled with amiodarone, on coumadin  . CAD (coronary artery disease)   . Aortic root dilatation   . Moderate aortic stenosis   . Chronic  anticoagulation   . Tachycardia-bradycardia syndrome     s/p PPM by Dr Doreatha Lew (MDT) 07/03/10  . GERD (gastroesophageal reflux disease)   . Chronic kidney disease   . Pacemaker     2012  . History of nephrectomy     left - due to kidney cancer  . Neuropathy     feet/legs  . Restless leg syndrome   . Headache(784.0)     hx migraines - takes Zoloft for migraines  . History of uterine cancer   . Cancer     hx Kidney cancer / hx of endometrial cancer  . PONV (postoperative nausea and vomiting)   . Anxiety   . Heart murmur   . HTN (hypertension)   . Hyperlipemia   . Hypothyroidism   . 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 Osteomyelitis as child  . Family history of anesthesia complication     daughter has ponv   FHx:    Reviewed / unchanged  SHx:    Reviewed / unchanged   Systems Review: Constitutional: Denies fever, chills, wt changes, headaches, insomnia, fatigue, night sweats, change in appetite. Eyes: Denies redness, blurred vision, diplopia, discharge, itchy, watery eyes.  ENT: Denies discharge, congestion, post nasal drip, epistaxis, sore throat, earache, hearing loss, dental pain, tinnitus, vertigo, sinus pain, snoring.  CV: Denies chest pain, palpitations, irregular heartbeat, syncope, dyspnea, diaphoresis, orthopnea, PND, claudication or edema. Respiratory: denies cough, dyspnea, DOE, pleurisy, hoarseness, laryngitis, wheezing.  Gastrointestinal: Denies dysphagia, odynophagia, heartburn, reflux, water brash, abdominal pain or cramps, nausea, vomiting, bloating, diarrhea, constipation, hematemesis, melena, hematochezia  or hemorrhoids. Genitourinary: Denies dysuria, frequency, urgency, nocturia, hesitancy, discharge, hematuria or flank pain. Musculoskeletal: Denies arthralgias, myalgias, stiffness, jt. swelling, pain, limping or strain/sprain.  Skin: Denies pruritus, rash, hives, warts, acne, eczema or change in skin lesion(s). Neuro: No weakness, tremor, incoordination, spasms,  paresthesia or pain. Psychiatric: Denies confusion, memory loss or sensory loss. Endo: Denies change in weight, skin or hair change.  Heme/Lymph: No excessive bleeding, bruising or enlarged lymph nodes.  Exam:  BP 174/84  Pulse 72  Temp 97.9 F   Resp 16  Ht 5' 0.5"   Wt 148 lb 9.6 oz   BMI 28.53 kg/m2  Appears well nourished - in no distress. Eyes: PERRLA, EOMs, conjunctiva no swelling or erythema. Sinuses: No frontal/maxillary tenderness ENT/Mouth: EAC's clear, TM's nl w/o erythema, bulging. Nares clear w/o erythema, swelling, exudates. Oropharynx clear without erythema or exudates. Oral hygiene is good. Tongue normal,  non obstructing. Hearing intact.  Neck: Supple. Thyroid nl. Car 2+/2+ without bruits, nodes or JVD. Chest: Respirations nl with BS clear & equal w/o rales, rhonchi, wheezing or stridor.  Cor: Heart sounds normal w/ regular rate and rhythm without sig. murmurs, gallops, clicks, or rubs. Peripheral pulses normal and equal  without edema.  Abdomen: Soft & bowel sounds normal. Non-tender w/o guarding, rebound, hernias, masses, or organomegaly.  Lymphatics: Unremarkable.  Musculoskeletal: Full ROM all peripheral extremities, joint stability, 5/5 strength, and normal gait.  Skin: Warm, dry without exposed rashes, lesions or ecchymosis apparent.  Neuro: Cranial nerves intact, reflexes equal bilaterally. Sensory-motor testing grossly intact. Tendon reflexes grossly intact.  Pysch: Alert & oriented x 3. Insight and judgement nl & appropriate. No ideations.  Assessment and Plan:  1. Hypertension - Continue monitor blood pressure at home. Continue diet/meds same.  2. Hyperlipidemia - Continue diet/meds, exercise,& lifestyle modifications. Continue monitor periodic cholesterol/liver & renal functions   3. Pre-diabetes/Insulin Resistance - Continue diet, exercise, lifestyle modifications. Monitor appropriate labs.  4. Vitamin D Deficiency - Continue supplementation.  5.  Nausea - prob due to Doxy - patient was advised to go ahead and switch to the alternate Abx that Dr Alvan Dame has prescribed for her. Also given Rx compazine 5 mg tid ac  Recommended regular exercise, BP monitoring, weight control, and discussed med and SE's. Recommended labs to assess and monitor clinical status. Further disposition pending results of labs.

## 2014-02-16 LAB — BASIC METABOLIC PANEL WITH GFR
BUN: 33 mg/dL — ABNORMAL HIGH (ref 6–23)
CALCIUM: 8.7 mg/dL (ref 8.4–10.5)
CO2: 24 mEq/L (ref 19–32)
Chloride: 113 mEq/L — ABNORMAL HIGH (ref 96–112)
Creat: 1.32 mg/dL — ABNORMAL HIGH (ref 0.50–1.10)
GFR, EST AFRICAN AMERICAN: 45 mL/min — AB
GFR, EST NON AFRICAN AMERICAN: 39 mL/min — AB
Glucose, Bld: 78 mg/dL (ref 70–99)
POTASSIUM: 4.2 meq/L (ref 3.5–5.3)
SODIUM: 145 meq/L (ref 135–145)

## 2014-02-16 LAB — LIPID PANEL
CHOL/HDL RATIO: 2.9 ratio
CHOLESTEROL: 87 mg/dL (ref 0–200)
HDL: 30 mg/dL — AB (ref >39)
LDL Cholesterol: 37 mg/dL (ref 0–99)
TRIGLYCERIDES: 102 mg/dL (ref <150)
VLDL: 20 mg/dL (ref 0–40)

## 2014-02-16 LAB — HEPATIC FUNCTION PANEL
ALT: 16 U/L (ref 0–35)
AST: 19 U/L (ref 0–37)
Albumin: 3.3 g/dL — ABNORMAL LOW (ref 3.5–5.2)
Alkaline Phosphatase: 123 U/L — ABNORMAL HIGH (ref 39–117)
BILIRUBIN DIRECT: 0.1 mg/dL (ref 0.0–0.3)
BILIRUBIN INDIRECT: 0.4 mg/dL (ref 0.2–1.2)
BILIRUBIN TOTAL: 0.5 mg/dL (ref 0.2–1.2)
Total Protein: 6.1 g/dL (ref 6.0–8.3)

## 2014-02-16 LAB — INSULIN, FASTING: INSULIN FASTING, SERUM: 15 u[IU]/mL (ref 3–28)

## 2014-02-16 LAB — VITAMIN D 25 HYDROXY (VIT D DEFICIENCY, FRACTURES): Vit D, 25-Hydroxy: 77 ng/mL (ref 30–89)

## 2014-02-16 LAB — MAGNESIUM: MAGNESIUM: 1.7 mg/dL (ref 1.5–2.5)

## 2014-02-16 LAB — TSH: TSH: 0.831 u[IU]/mL (ref 0.350–4.500)

## 2014-02-22 ENCOUNTER — Ambulatory Visit (INDEPENDENT_AMBULATORY_CARE_PROVIDER_SITE_OTHER): Payer: Medicare Other | Admitting: *Deleted

## 2014-02-22 DIAGNOSIS — I4891 Unspecified atrial fibrillation: Secondary | ICD-10-CM

## 2014-02-22 LAB — POCT INR: INR: 4.5

## 2014-02-25 ENCOUNTER — Telehealth: Payer: Self-pay | Admitting: Cardiology

## 2014-02-25 NOTE — Telephone Encounter (Signed)
New problem    Pt's daughter called pt's BP goes way up 158/134  Hr 55 pt feels sick.     Sitting 88/52   72 hr   Laying down 101/51   72hr  .   Daughter would like to know what to do please give her a call back.

## 2014-02-25 NOTE — Telephone Encounter (Signed)
Spoke with daughter and they are using a wrist cuff blood pressure machine. Recheck blood pressure at 4:39 pm was 148/79 lying down. Discussed with Dr Marlou Porch office DOD and since patient was seen by Dr Gaspar Skeeters PCP on 02/15/14 with HTN issues recommended contacting him. Dr. Mare Ferrari out of the office for the week and this would be better consistency since he is not familiar with patient. Advised daughter and asked that she call back with update.

## 2014-02-26 ENCOUNTER — Encounter: Payer: Self-pay | Admitting: Internal Medicine

## 2014-02-26 ENCOUNTER — Ambulatory Visit (INDEPENDENT_AMBULATORY_CARE_PROVIDER_SITE_OTHER): Payer: Medicare Other | Admitting: Internal Medicine

## 2014-02-26 VITALS — BP 114/64 | HR 64 | Temp 97.4°F | Resp 18 | Ht 60.5 in | Wt 134.8 lb

## 2014-02-26 DIAGNOSIS — IMO0002 Reserved for concepts with insufficient information to code with codable children: Secondary | ICD-10-CM

## 2014-02-26 DIAGNOSIS — I10 Essential (primary) hypertension: Secondary | ICD-10-CM

## 2014-02-26 DIAGNOSIS — C55 Malignant neoplasm of uterus, part unspecified: Secondary | ICD-10-CM

## 2014-02-26 DIAGNOSIS — H81319 Aural vertigo, unspecified ear: Secondary | ICD-10-CM

## 2014-02-26 MED ORDER — MECLIZINE HCL 25 MG PO TABS: 25.0000 mg | ORAL_TABLET | Freq: Three times a day (TID) | ORAL | Status: DC | PRN

## 2014-02-26 MED ORDER — PREDNISONE 20 MG PO TABS: ORAL_TABLET | ORAL | Status: DC

## 2014-02-26 NOTE — Progress Notes (Signed)
Subjective:    Patient ID: [name], female    DOB: 1933/11/27, 80 y.o.   MRN: [id number]  HPIpatient presents with 3-5 day Hx/o positional vertigo type Sx's assoc with head turning and body positional changes and also assoc N & occasional Vomiting. No diplopia, paresthesias or other abnormal neuro Sx's.    Medication List   amiodarone 200 MG tablet  Commonly known as:  PACERONE  Take 200 mg by mouth every morning.     atenolol 100 MG tablet  Commonly known as:  TENORMIN  Take 100 mg by mouth 2 (two) times daily.     atorvastatin 40 MG tablet  Commonly known as:  LIPITOR  Takes 0.5 tablet Monday-Friday     Biotin 1000 MCG tablet  Take 10,000 mcg by mouth every morning.     FLORA-Q Caps capsule  Take 1 capsule by mouth 2 (two) times daily.     levothyroxine 100 MCG tablet  Commonly known as:  SYNTHROID, LEVOTHROID  TAKE 1/2 TABLET BY MOUTH EVERY DAY ON EMPTY STOMACH WITH A FULL GLASS OF WATER     losartan-hydrochlorothiazide 100-25 MG per tablet  Commonly known as:  HYZAAR  Take 0.5 tablets by mouth every morning.     Magnesium 250 MG Tabs  Take 1 tablet by mouth daily.     meclizine 25 MG tablet  Commonly known as:  ANTIVERT  Take 1 tablet (25 mg total) by mouth 3 (three) times daily as needed for dizziness or nausea.     nitroGLYCERIN 0.4 MG SL tablet  Commonly known as:  NITROSTAT  Place 0.4 mg under the tongue every 5 (five) minutes as needed for chest pain.     PEPCID PO  Take 150 mg by mouth daily.     potassium chloride SA 20 MEQ tablet  Commonly known as:  K-DUR,KLOR-CON  Take 20 mEq by mouth 3 (three) times daily.     predniSONE 20 MG tablet  Commonly known as:  DELTASONE  1 tab 3 x day for 3 days, then 1 tab 2 x day for 3 days, then 1 tab 1 x day for 5 days     pregabalin 75 MG capsule  Commonly known as:  LYRICA  Take 75 mg by mouth 2 (two) times daily.     prochlorperazine 5 MG tablet  Commonly known as:  COMPAZINE  Take 1 tablet 3 x  day before meals to prevent nausea     sertraline 50 MG tablet  Commonly known as:  ZOLOFT  Take 50 mg by mouth every evening.     tiZANidine 4 MG capsule  Commonly known as:  ZANAFLEX  Take 1 capsule (4 mg total) by mouth 3 (three) times daily as needed for muscle spasms.     Vitamin D3 2000 UNITS Tabs  Take 1 tablet by mouth 2 (two) times daily.     warfarin 2 MG tablet  Commonly known as:  COUMADIN  Take 0.5-1 tablets (1-2 mg total) by mouth daily. Wednesday and Friday takes 1mg . Takes 2 mg all other days       Allergies  Allergen Reactions  . Codeine Nausea And Vomiting  . Morphine And Related Nausea And Vomiting  . Requip [Ropinirole Hcl]     Headache   . Zinc     nausea   Past Medical History  Diagnosis Date  . PAF (paroxysmal atrial fibrillation)     controlled with amiodarone, on coumadin  . CAD (coronary  artery disease)   . Aortic root dilatation   . Moderate aortic stenosis   . Chronic anticoagulation   . Tachycardia-bradycardia syndrome     s/p PPM by Dr Doreatha Lew (MDT) 07/03/10  . GERD (gastroesophageal reflux disease)   . Chronic kidney disease   . Pacemaker     2012  . History of nephrectomy     left - due to kidney cancer  . Neuropathy     feet/legs  . Restless leg syndrome   . Headache(784.0)     hx migraines - takes Zoloft for migraines  . History of uterine cancer   . Cancer     hx Kidney cancer / hx of endometrial cancer  . PONV (postoperative nausea and vomiting)   . Anxiety   . Heart murmur   . HTN (hypertension)   . Hyperlipemia   . Hypothyroidism   . 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 Osteomyelitis as child  . Family history of anesthesia complication     daughter has ponv   Past Surgical History  Procedure Laterality Date  . Cardiac catheterization  2008  . Nephrectomy    . Total abdominal hysterectomy    . Patent ductus arterious repair  age 80  . Ptvp    . Pacemaker insertion  07/04/11    by Dr Doreatha Lew  . Cholecystectomy    .  Upper gastrointestinal endoscopy    . Total knee arthroplasty  02/07/2012    Procedure: TOTAL KNEE ARTHROPLASTY;  Surgeon: Mauri Pole, MD;  Location: [facility];  Service: Orthopedics;  Laterality: Right;  . Insert / replace / remove pacemaker    . Joint replacement      rt total hip 2009  . I&d knee with poly exchange Right 12/17/2013    Procedure: IRRIGATION AND DEBRIDEMEN RIGHT TOTAL  KNEE WITH POLY EXCHANGE;  Surgeon: Mauri Pole, MD;  Location: [facility];  Service: Orthopedics;  Laterality: Right;   Review of Systems In addition to the HPI above,  No Fever-chills,  No Headache, No changes with Vision or hearing,  No problems swallowing food or Liquids,  No Chest pain or productive Cough or Shortness of Breath,  No Abdominal pain, No Nausea or Vommitting, Bowel movements are regular,  No Blood in stool or Urine,  No dysuria,  No new skin rashes or bruises,  No new joints pains-aches,  No new weakness, tingling, numbness in any extremity,  No recent weight loss,  No polyuria, polydypsia or polyphagia,  No significant Mental Stressors.  A full 10 point Review of Systems was done, except as stated above, all other Review of Systems were negative  Objective:   Physical Exam BP 114/64  P64  T 97.4 F   Resp 18  Ht 5' 0.5"  Wt 134 lb 12.8 oz   BMI 25.88 kg/m2 HEENT - Eac's patent. TM's Nl.EOM's full. PERRLA. NasoOroPharynx clear. Neck - supple. Nl Thyroid. No bruits nodes JVD Chest - Clear equal BS Cor - Nl HS. RRR w/o sig MGR. PP 1(+) No edema. Abd - No palpable organomegaly, masses or tenderness. BS nl. MS- FROM. w/o deformities. Muscle power tone and bulk Nl. Gait Nl. Neuro - No obvious Cr N abnormalities. Sensory, motor and Cerebellar functions appear Nl w/o focal abnormalities.  Assessment & Plan:   1. Positional vertigo, bilateral -Rx Prednisone 20 mg #20 -Rx Meclizine 25 mg # 90 tid prn - has Rx Compazine 5 mg to take tid prn  ROV prn if  Sx's change  2. Unspecified  essential hypertension  - Advised taper Atenolol 100 mg bid to 1/2 bid because of occas postural lightheadedness

## 2014-02-26 NOTE — Telephone Encounter (Signed)
Patient seen by PCP today.

## 2014-02-26 NOTE — Patient Instructions (Signed)
Benign Positional Vertigo Vertigo means you feel like you or your surroundings are moving when they are not. Benign positional vertigo is the most common form of vertigo. Benign means that the cause of your condition is not serious. Benign positional vertigo is more common in older adults. CAUSES  Benign positional vertigo is the result of an upset in the labyrinth system. This is an area in the middle ear that helps control your balance. This may be caused by a viral infection, head injury, or repetitive motion. However, often no specific cause is found. SYMPTOMS  Symptoms of benign positional vertigo occur when you move your head or eyes in different directions. Some of the symptoms may include:  Loss of balance and falls.  Vomiting.  Blurred vision.  Dizziness.  Nausea.  Involuntary eye movements (nystagmus). DIAGNOSIS  Benign positional vertigo is usually diagnosed by physical exam. If the specific cause of your benign positional vertigo is unknown, your caregiver may perform imaging tests, such as magnetic resonance imaging (MRI) or computed tomography (CT). TREATMENT  Your caregiver may recommend movements or procedures to correct the benign positional vertigo. Medicines such as meclizine, benzodiazepines, and medicines for nausea may be used to treat your symptoms. In rare cases, if your symptoms are caused by certain conditions that affect the inner ear, you may need surgery. HOME CARE INSTRUCTIONS   Follow your caregiver's instructions.  Move slowly. Do not make sudden body or head movements.  Avoid driving.  Avoid operating heavy machinery.  Avoid performing any tasks that would be dangerous to you or others during a vertigo episode.  Drink enough fluids to keep your urine clear or pale yellow. SEEK IMMEDIATE MEDICAL CARE IF:   You develop problems with walking, weakness, numbness, or using your arms, hands, or legs.  You have difficulty speaking.  You develop  severe headaches.  Your nausea or vomiting continues or gets worse.  You develop visual changes.  Your family or friends notice any behavioral changes.  Your condition gets worse.  You have a fever.  You develop a stiff neck or sensitivity to light. MAKE SURE YOU:   Understand these instructions.  Will watch your condition.  Will get help right away if you are not doing well or get worse.   Vertigo Vertigo means you feel like you are moving when you are not. Vertigo can make you feel like things around you are moving when they are not. This problem often goes away on its own.  HOME CARE   Follow your doctor's instructions.  Avoid driving.  Avoid using heavy machinery.  Avoid doing any activity that could be dangerous if you have a vertigo attack.  Tell your doctor if a medicine seems to cause your vertigo. GET HELP RIGHT AWAY IF:   Your medicines do not help or make you feel worse.  You have trouble talking or walking.  You feel weak or have trouble using your arms, hands, or legs.  You have bad headaches.  You keep feeling sick to your stomach (nauseous) or throwing up (vomiting).  Your vision changes.  A family member notices changes in your behavior.  Your problems get worse. MAKE SURE YOU:  Understand these instructions.  Will watch your condition.  Will get help right away if you are not doing well or get worse. Document Released: 06/01/2008 Document Revised: 11/15/2011 Document Reviewed: 03/11/2011 [facility] Patient Information 2015 [place], [place]. This information is not intended to replace advice given to you by your health  care provider. Make sure you discuss any questions you have with your health care provider.

## 2014-02-28 ENCOUNTER — Ambulatory Visit: Payer: Self-pay | Admitting: Internal Medicine

## 2014-03-02 ENCOUNTER — Encounter (HOSPITAL_COMMUNITY): Payer: Self-pay | Admitting: Emergency Medicine

## 2014-03-02 ENCOUNTER — Emergency Department (HOSPITAL_COMMUNITY): Payer: Medicare Other

## 2014-03-02 ENCOUNTER — Inpatient Hospital Stay (HOSPITAL_COMMUNITY)
Admission: EM | Admit: 2014-03-02 | Discharge: 2014-03-08 | DRG: 502 | Disposition: A | Discharge status: Home Health | Payer: Medicare Other | Attending: Orthopedic Surgery | Admitting: Orthopedic Surgery

## 2014-03-02 DIAGNOSIS — M79604 Pain in right leg: Secondary | ICD-10-CM

## 2014-03-02 DIAGNOSIS — Z85528 Personal history of other malignant neoplasm of kidney: Secondary | ICD-10-CM

## 2014-03-02 DIAGNOSIS — Z800 Family history of malignant neoplasm of digestive organs: Secondary | ICD-10-CM

## 2014-03-02 DIAGNOSIS — S86819A Strain of other muscle(s) and tendon(s) at lower leg level, unspecified leg, initial encounter: Principal | ICD-10-CM

## 2014-03-02 DIAGNOSIS — W108XXA Fall (on) (from) other stairs and steps, initial encounter: Secondary | ICD-10-CM | POA: Diagnosis present

## 2014-03-02 DIAGNOSIS — R011 Cardiac murmur, unspecified: Secondary | ICD-10-CM | POA: Diagnosis present

## 2014-03-02 DIAGNOSIS — G2581 Restless legs syndrome: Secondary | ICD-10-CM | POA: Diagnosis present

## 2014-03-02 DIAGNOSIS — K219 Gastro-esophageal reflux disease without esophagitis: Secondary | ICD-10-CM | POA: Diagnosis present

## 2014-03-02 DIAGNOSIS — I2510 Atherosclerotic heart disease of native coronary artery without angina pectoris: Secondary | ICD-10-CM | POA: Diagnosis present

## 2014-03-02 DIAGNOSIS — M7989 Other specified soft tissue disorders: Secondary | ICD-10-CM | POA: Diagnosis present

## 2014-03-02 DIAGNOSIS — N189 Chronic kidney disease, unspecified: Secondary | ICD-10-CM | POA: Diagnosis present

## 2014-03-02 DIAGNOSIS — Z8542 Personal history of malignant neoplasm of other parts of uterus: Secondary | ICD-10-CM

## 2014-03-02 DIAGNOSIS — Y9229 Other specified public building as the place of occurrence of the external cause: Secondary | ICD-10-CM

## 2014-03-02 DIAGNOSIS — F411 Generalized anxiety disorder: Secondary | ICD-10-CM | POA: Diagnosis present

## 2014-03-02 DIAGNOSIS — Z885 Allergy status to narcotic agent status: Secondary | ICD-10-CM

## 2014-03-02 DIAGNOSIS — Z96659 Presence of unspecified artificial knee joint: Secondary | ICD-10-CM

## 2014-03-02 DIAGNOSIS — S838X9A Sprain of other specified parts of unspecified knee, initial encounter: Principal | ICD-10-CM | POA: Diagnosis present

## 2014-03-02 DIAGNOSIS — Z8249 Family history of ischemic heart disease and other diseases of the circulatory system: Secondary | ICD-10-CM

## 2014-03-02 DIAGNOSIS — I129 Hypertensive chronic kidney disease with stage 1 through stage 4 chronic kidney disease, or unspecified chronic kidney disease: Secondary | ICD-10-CM | POA: Diagnosis present

## 2014-03-02 DIAGNOSIS — Z905 Acquired absence of kidney: Secondary | ICD-10-CM

## 2014-03-02 DIAGNOSIS — Z7901 Long term (current) use of anticoagulants: Secondary | ICD-10-CM

## 2014-03-02 DIAGNOSIS — I4891 Unspecified atrial fibrillation: Secondary | ICD-10-CM | POA: Diagnosis present

## 2014-03-02 DIAGNOSIS — M25561 Pain in right knee: Secondary | ICD-10-CM

## 2014-03-02 DIAGNOSIS — E785 Hyperlipidemia, unspecified: Secondary | ICD-10-CM | POA: Diagnosis present

## 2014-03-02 DIAGNOSIS — Z888 Allergy status to other drugs, medicaments and biological substances status: Secondary | ICD-10-CM

## 2014-03-02 DIAGNOSIS — G589 Mononeuropathy, unspecified: Secondary | ICD-10-CM | POA: Diagnosis present

## 2014-03-02 DIAGNOSIS — Z950 Presence of cardiac pacemaker: Secondary | ICD-10-CM

## 2014-03-02 DIAGNOSIS — R791 Abnormal coagulation profile: Secondary | ICD-10-CM | POA: Diagnosis present

## 2014-03-02 DIAGNOSIS — T45515A Adverse effect of anticoagulants, initial encounter: Secondary | ICD-10-CM | POA: Diagnosis present

## 2014-03-02 DIAGNOSIS — E039 Hypothyroidism, unspecified: Secondary | ICD-10-CM | POA: Diagnosis present

## 2014-03-02 LAB — CBC WITH DIFFERENTIAL/PLATELET
Basophils Absolute: 0 10*3/uL (ref 0.0–0.1)
Basophils Relative: 0 % (ref 0–1)
Eosinophils Absolute: 0 10*3/uL (ref 0.0–0.7)
Eosinophils Relative: 0 % (ref 0–5)
HEMATOCRIT: 36.9 % (ref 36.0–46.0)
Hemoglobin: 12.2 g/dL (ref 12.0–15.0)
LYMPHS PCT: 17 % (ref 12–46)
Lymphs Abs: 1.7 10*3/uL (ref 0.7–4.0)
MCH: 30.6 pg (ref 26.0–34.0)
MCHC: 33.1 g/dL (ref 30.0–36.0)
MCV: 92.5 fL (ref 78.0–100.0)
Monocytes Absolute: 1 10*3/uL (ref 0.1–1.0)
Monocytes Relative: 10 % (ref 3–12)
NEUTROS ABS: 7.2 10*3/uL (ref 1.7–7.7)
NEUTROS PCT: 73 % (ref 43–77)
PLATELETS: 160 10*3/uL (ref 150–400)
RBC: 3.99 MIL/uL (ref 3.87–5.11)
RDW: 13.8 % (ref 11.5–15.5)
WBC: 9.8 10*3/uL (ref 4.0–10.5)

## 2014-03-02 LAB — BASIC METABOLIC PANEL
BUN: 53 mg/dL — ABNORMAL HIGH (ref 6–23)
CHLORIDE: 107 meq/L (ref 96–112)
CO2: 22 meq/L (ref 19–32)
Calcium: 8.3 mg/dL — ABNORMAL LOW (ref 8.4–10.5)
Creatinine, Ser: 1.36 mg/dL — ABNORMAL HIGH (ref 0.50–1.10)
GFR calc Af Amer: 42 mL/min — ABNORMAL LOW (ref >90)
GFR calc non Af Amer: 36 mL/min — ABNORMAL LOW (ref >90)
Glucose, Bld: 101 mg/dL — ABNORMAL HIGH (ref 70–99)
POTASSIUM: 4.6 meq/L (ref 3.7–5.3)
SODIUM: 140 meq/L (ref 137–147)

## 2014-03-02 LAB — PROTIME-INR
INR: 4.6 — AB (ref 0.00–1.49)
PROTHROMBIN TIME: 43.5 s — AB (ref 11.6–15.2)

## 2014-03-02 MED ORDER — FENTANYL CITRATE 0.05 MG/ML IJ SOLN
50.0000 ug | Freq: Once | INTRAMUSCULAR | Status: AC
  Administered 2014-03-02: 50 ug via INTRAVENOUS
  Filled 2014-03-02: qty 2

## 2014-03-02 MED ORDER — MORPHINE SULFATE 4 MG/ML IJ SOLN: 6.0000 mg | Freq: Once | INTRAMUSCULAR | Status: DC

## 2014-03-02 MED ORDER — ONDANSETRON HCL 4 MG/2ML IJ SOLN
4.0000 mg | Freq: Once | INTRAMUSCULAR | Status: AC
  Administered 2014-03-02: 4 mg via INTRAVENOUS
  Filled 2014-03-02: qty 2

## 2014-03-02 NOTE — ED Notes (Signed)
Bed: RA30 Expected date:  Expected time:  Means of arrival:  Comments: ems- knee pain

## 2014-03-02 NOTE — ED Provider Notes (Signed)
CSN: [id number]     Arrival date & time 03/02/14  1835 History   First MD Initiated Contact with Patient 03/02/14 1839     Chief Complaint  Patient presents with  . Knee Pain     (Consider location/radiation/quality/duration/timing/severity/associated sxs/prior Treatment) Patient is a 80 y.o. female presenting with knee pain. The history is provided by the patient and medical records.  Knee Pain  This is a 80 y.o. F with PMH significant for HTN, HLP, hypothyroidism, CAD, aortic stenosis, pacemaker placement presenting to the ED for right leg injury.  [place] with her right leg which she does not normally do when her knee buckled and she fell backwards into her daughters arms.  Daughter was able to lower her to ground without head injury or LOC.  Pt now has significant swelling and bruising to right knee and tib/fib area.  She denies numbness or paresthesias of RLE.  Pt is s/p TKA of right knee in 2013 by Dr. Alvan Dame.  Did have surgery on right knee again in April 2015 for suspected infection of right knee prosthesis.  VS stable on arrival.  Past Medical History  Diagnosis Date  . PAF (paroxysmal atrial fibrillation)     controlled with amiodarone, on coumadin  . CAD (coronary artery disease)   . Aortic root dilatation   . Moderate aortic stenosis   . Chronic anticoagulation   . Tachycardia-bradycardia syndrome     s/p PPM by Dr Doreatha Lew (MDT) 07/03/10  . GERD (gastroesophageal reflux disease)   . Chronic kidney disease   . Pacemaker     2012  . History of nephrectomy     left - due to kidney cancer  . Neuropathy     feet/legs  . Restless leg syndrome   . Headache(784.0)     hx migraines - takes Zoloft for migraines  . History of uterine cancer   . Cancer     hx Kidney cancer / hx of endometrial cancer  . PONV (postoperative nausea and vomiting)   . Anxiety   . Heart murmur   . HTN (hypertension)   . Hyperlipemia   .  Hypothyroidism   . 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 Osteomyelitis as child  . Family history of anesthesia complication     daughter has ponv   Past Surgical History  Procedure Laterality Date  . Cardiac catheterization  2008  . Nephrectomy    . Total abdominal hysterectomy    . Patent ductus arterious repair  age 28  . Ptvp    . Pacemaker insertion  07/04/11    by Dr Doreatha Lew  . Cholecystectomy    . Upper gastrointestinal endoscopy    . Total knee arthroplasty  02/07/2012    Procedure: TOTAL KNEE ARTHROPLASTY;  Surgeon: Mauri Pole, MD;  Location: [facility];  Service: Orthopedics;  Laterality: Right;  . Insert / replace / remove pacemaker    . Joint replacement      rt total hip 2009  . I&d knee with poly exchange Right 12/17/2013    Procedure: IRRIGATION AND DEBRIDEMEN RIGHT TOTAL  KNEE WITH POLY EXCHANGE;  Surgeon: Mauri Pole, MD;  Location: [facility];  Service: Orthopedics;  Laterality: Right;   Family History  Problem Relation Age of Onset  . Colon cancer Mother 25  . Heart disease Mother   . Heart disease Father   . Heart attack Father   .  Heart disease Maternal Grandmother    History  Substance Use Topics  . Smoking status: Never Smoker   . Smokeless tobacco: Never Used  . Alcohol Use: No   OB History   Grav Para Term Preterm Abortions TAB SAB Ect Mult Living                 Review of Systems  Musculoskeletal: Positive for arthralgias.  All other systems reviewed and are negative.     Allergies  Codeine; Morphine and related; Requip; and Zinc  Home Medications   Prior to Admission medications   Medication Sig Start Date End Date Taking? Authorizing Provider  amiodarone (PACERONE) 200 MG tablet Take 200 mg by mouth every morning.     Historical Provider, MD  atenolol (TENORMIN) 100 MG tablet Take 100 mg by mouth 2 (two) times daily.    Historical Provider, MD  atorvastatin (LIPITOR) 40 MG tablet Takes 0.5 tablet Monday-Friday    Historical Provider, MD   Biotin 1000 MCG tablet Take 10,000 mcg by mouth every morning.     Historical Provider, MD  Cholecalciferol (VITAMIN D3) 2000 UNITS TABS Take 1 tablet by mouth 2 (two) times daily.    Historical Provider, MD  Famotidine (PEPCID PO) Take 150 mg by mouth daily.    Historical Provider, MD  FLORA-[facility]) CAPS capsule Take 1 capsule by mouth 2 (two) times daily.     Historical Provider, MD  levothyroxine (SYNTHROID, LEVOTHROID) 100 MCG tablet TAKE 1/2 TABLET BY MOUTH EVERY DAY ON EMPTY STOMACH WITH A FULL GLASS OF WATER    [name], MD  losartan-hydrochlorothiazide (HYZAAR) 100-25 MG per tablet Take 0.5 tablets by mouth every morning.    Historical Provider, MD  Magnesium 250 MG TABS Take 1 tablet by mouth daily.     Historical Provider, MD  meclizine (ANTIVERT) 25 MG tablet Take 1 tablet (25 mg total) by mouth 3 (three) times daily as needed for dizziness or nausea. 02/26/14 02/26/15  [name], MD  nitroGLYCERIN (NITROSTAT) 0.4 MG SL tablet Place 0.4 mg under the tongue every 5 (five) minutes as needed for chest pain.    Historical Provider, MD  potassium chloride SA (K-DUR,KLOR-CON) 20 MEQ tablet Take 20 mEq by mouth 3 (three) times daily. 08/29/12   Eugenie Filler, MD  predniSONE (DELTASONE) 20 MG tablet 1 tab 3 x day for 3 days, then 1 tab 2 x day for 3 days, then 1 tab 1 x day for 5 days 02/26/14   [name], MD  pregabalin (LYRICA) 75 MG capsule Take 75 mg by mouth 2 (two) times daily.    Historical Provider, MD  prochlorperazine (COMPAZINE) 5 MG tablet Take 1 tablet 3 x day before meals to prevent nausea 02/15/14   [name], MD  sertraline (ZOLOFT) 50 MG tablet Take 50 mg by mouth every evening.    Historical Provider, MD  tiZANidine (ZANAFLEX) 4 MG capsule Take 1 capsule (4 mg total) by mouth 3 (three) times daily as needed for muscle spasms. 12/19/13   Lucille Passy Babish, PA-C  warfarin (COUMADIN) 2 MG tablet Take 0.5-1 tablets (1-2 mg total) by mouth daily.  Wednesday and Friday takes 1mg . Takes 2 mg all other days 12/19/13   Lucille Passy Babish, PA-C   BP 193/101  Pulse 76  Temp(Src) 98.3 F (36.8 C) (Oral)  Resp 19  SpO2 99%  Physical Exam  Nursing note and vitals reviewed. Constitutional: She is oriented to person, place, and time. She  appears well-developed and well-nourished. No distress.  HENT:  Head: Normocephalic and atraumatic.  Mouth/Throat: Oropharynx is clear and moist.  Eyes: Conjunctivae and EOM are normal. Pupils are equal, round, and reactive to light.  Neck: Normal range of motion. Neck supple.  Cardiovascular: Normal rate, regular rhythm and normal heart sounds.   Pulmonary/Chest: Effort normal and breath sounds normal. No respiratory distress. She has no wheezes.  Abdominal: Soft.  Musculoskeletal:       Right knee: She exhibits decreased range of motion, swelling and ecchymosis.       Right lower leg: She exhibits tenderness, bony tenderness and swelling.  Right knee with well healed midline TKA incision; limited ROM of knee; significant swelling and bruising extending into the tib/fib area; proximal anterior compartment is tense but not rigid at this time; posterior compartment is soft; DP pulse intact; normal sensation and movement of right ankle and foot  Neurological: She is alert and oriented to person, place, and time.  Skin: Skin is warm and dry. She is not diaphoretic.  Psychiatric: She has a normal mood and affect.    ED Course  Procedures (including critical care time) Labs Review Labs Reviewed - No data to display  Imaging Review No results found.   EKG Interpretation None      MDM   Final diagnoses:  Swelling of right lower extremity  Supratherapeutic INR  Lower extremity pain, right   80 y.o. F on coumadin with fall earlier today due to buckling of right knee.  No head injury or LOC.  On exam, her right distal leg has significant swelling and bruising.  Compartment is tense but not rigid.   Posterior compartment is soft.  Concern for early compartment syndrome.  X-rays were obtained which are negative for acute fx or dislocation.  Discussed with Dr. Venora Maples who has evaluated patient and agrees with concerns regarding compartment syndrome.  Will discuss with orthopedics for recommendations.  Dr. Venora Maples has discussed case with Dr. Onnie Graham-- feels compartment syndrome unlikely given distribution of sx limited to proximal portion of anterior compartment.  Recommends RICE routine, observe for a few hours.  Patient has been observed for 4+ hours in the ED.  Bruising has extended down anterior shin as expected given her coumadin use, however proximal anterior compartment now feels more firm than previously.  She continues to have strong DP pulse in RLE, however she states sensation in her right foot is changed from baseline.  States her right foot feels "swollen and puffy" however is normal in appearance.  Have discussed again with Dr. Onnie Graham-- will come to ED and personally evaluate pt.  Dr. Onnie Graham has evaluated patient.  Concern for ruptured patellar tendon.  Will admit overnight for pain control and further evaluation of injuries in morning.  Pt given vitamin K for reversal of supra-therapeutic INR of 4.60, hold coumadin for now.  Larene Pickett, PA-C 03/03/14 Mallory, PA-C 03/03/14 [phone]

## 2014-03-02 NOTE — ED Notes (Signed)
She states that upon getting ready to go shopping this afternoon at about 1500 hours, she lifted her right leg up into their truck; and it "buckled".  She states Dr. Alvan Dame performed right total knee some years ago; and that 6 weeks ago "I had an infection in my right knee and he cleaned it out".  Her right knee and lower leg are massively swollen and ecchymotic.  Her right toes are cool and dusky.

## 2014-03-02 NOTE — ED Notes (Signed)
Per ems pt was walking, pt reports she felt her right leg twist, swelling below the knee with bruising. Deformity noted. Pedal pulse present. Pt had right knee replacement 2 years ago.

## 2014-03-03 ENCOUNTER — Encounter (HOSPITAL_COMMUNITY): Payer: Self-pay | Admitting: *Deleted

## 2014-03-03 LAB — URINALYSIS, ROUTINE W REFLEX MICROSCOPIC
BILIRUBIN URINE: NEGATIVE
Glucose, UA: NEGATIVE mg/dL
HGB URINE DIPSTICK: NEGATIVE
KETONES UR: NEGATIVE mg/dL
Nitrite: NEGATIVE
PROTEIN: NEGATIVE mg/dL
Specific Gravity, Urine: 1.021 (ref 1.005–1.030)
UROBILINOGEN UA: 0.2 mg/dL (ref 0.0–1.0)
pH: 5.5 (ref 5.0–8.0)

## 2014-03-03 LAB — PROTIME-INR
INR: 3.81 — ABNORMAL HIGH (ref 0.00–1.49)
Prothrombin Time: 37.5 seconds — ABNORMAL HIGH (ref 11.6–15.2)

## 2014-03-03 LAB — URINE MICROSCOPIC-ADD ON

## 2014-03-03 MED ORDER — DOCUSATE SODIUM 100 MG PO CAPS
100.0000 mg | ORAL_CAPSULE | Freq: Two times a day (BID) | ORAL | Status: DC
  Administered 2014-03-03: 100 mg via ORAL
  Filled 2014-03-03 (×2): qty 1

## 2014-03-03 MED ORDER — NITROGLYCERIN 0.4 MG/HR TD PT24
0.4000 mg | MEDICATED_PATCH | Freq: Every day | TRANSDERMAL | Status: DC | PRN
  Filled 2014-03-03: qty 1

## 2014-03-03 MED ORDER — LOSARTAN POTASSIUM-HCTZ 100-25 MG PO TABS: 0.5000 | ORAL_TABLET | Freq: Every morning | ORAL | Status: DC

## 2014-03-03 MED ORDER — ATORVASTATIN CALCIUM 20 MG PO TABS
20.0000 mg | ORAL_TABLET | ORAL | Status: DC
  Administered 2014-03-04 – 2014-03-07 (×3): 20 mg via ORAL
  Filled 2014-03-03 (×5): qty 1

## 2014-03-03 MED ORDER — HYDROCODONE-ACETAMINOPHEN 5-325 MG PO TABS
1.0000 | ORAL_TABLET | Freq: Four times a day (QID) | ORAL | Status: DC | PRN
Start: 2014-03-03 — End: 2014-03-07
  Administered 2014-03-04: 1 via ORAL
  Filled 2014-03-03 (×2): qty 1

## 2014-03-03 MED ORDER — BISACODYL 5 MG PO TBEC
5.0000 mg | DELAYED_RELEASE_TABLET | Freq: Every day | ORAL | Status: DC | PRN
  Filled 2014-03-03: qty 1

## 2014-03-03 MED ORDER — MORPHINE SULFATE 2 MG/ML IJ SOLN: 0.5000 mg | INTRAMUSCULAR | Status: DC | PRN

## 2014-03-03 MED ORDER — MECLIZINE HCL 25 MG PO TABS
25.0000 mg | ORAL_TABLET | Freq: Three times a day (TID) | ORAL | Status: DC | PRN
  Administered 2014-03-03: 25 mg via ORAL
  Filled 2014-03-03: qty 1

## 2014-03-03 MED ORDER — FAMOTIDINE 20 MG PO TABS
20.0000 mg | ORAL_TABLET | Freq: Two times a day (BID) | ORAL | Status: DC
  Administered 2014-03-03 – 2014-03-08 (×11): 20 mg via ORAL
  Filled 2014-03-03 (×12): qty 1

## 2014-03-03 MED ORDER — HYDROCHLOROTHIAZIDE 25 MG PO TABS
25.0000 mg | ORAL_TABLET | Freq: Every day | ORAL | Status: DC
  Administered 2014-03-03 – 2014-03-08 (×6): 25 mg via ORAL
  Filled 2014-03-03 (×6): qty 1

## 2014-03-03 MED ORDER — METHOCARBAMOL 500 MG PO TABS
500.0000 mg | ORAL_TABLET | Freq: Four times a day (QID) | ORAL | Status: DC | PRN
  Administered 2014-03-03 – 2014-03-05 (×4): 500 mg via ORAL
  Filled 2014-03-03 (×5): qty 1

## 2014-03-03 MED ORDER — PREDNISONE 20 MG PO TABS
20.0000 mg | ORAL_TABLET | Freq: Every day | ORAL | Status: AC
  Administered 2014-03-04 – 2014-03-08 (×5): 20 mg via ORAL
  Filled 2014-03-03 (×5): qty 1

## 2014-03-03 MED ORDER — MINOCYCLINE HCL 100 MG PO CAPS
100.0000 mg | ORAL_CAPSULE | Freq: Two times a day (BID) | ORAL | Status: DC
  Administered 2014-03-03 – 2014-03-08 (×10): 100 mg via ORAL
  Filled 2014-03-03 (×12): qty 1

## 2014-03-03 MED ORDER — METHOCARBAMOL 1000 MG/10ML IJ SOLN
500.0000 mg | Freq: Four times a day (QID) | INTRAVENOUS | Status: DC | PRN
  Filled 2014-03-03: qty 5

## 2014-03-03 MED ORDER — ONDANSETRON 4 MG PO TBDP
4.0000 mg | ORAL_TABLET | Freq: Three times a day (TID) | ORAL | Status: DC | PRN
  Administered 2014-03-03 (×3): 4 mg via ORAL
  Filled 2014-03-03 (×4): qty 1

## 2014-03-03 MED ORDER — HYDROMORPHONE HCL PF 1 MG/ML IJ SOLN
0.5000 mg | INTRAMUSCULAR | Status: DC | PRN
  Administered 2014-03-03 – 2014-03-06 (×2): 0.5 mg via INTRAVENOUS
  Filled 2014-03-03 (×2): qty 1

## 2014-03-03 MED ORDER — NITROGLYCERIN 0.4 MG SL SUBL: 0.4000 mg | SUBLINGUAL_TABLET | SUBLINGUAL | Status: DC | PRN

## 2014-03-03 MED ORDER — ALBUTEROL SULFATE (2.5 MG/3ML) 0.083% IN NEBU: 2.5000 mg | INHALATION_SOLUTION | Freq: Four times a day (QID) | RESPIRATORY_TRACT | Status: DC | PRN

## 2014-03-03 MED ORDER — POTASSIUM CHLORIDE CRYS ER 20 MEQ PO TBCR
20.0000 meq | EXTENDED_RELEASE_TABLET | Freq: Three times a day (TID) | ORAL | Status: DC
  Administered 2014-03-03 – 2014-03-08 (×15): 20 meq via ORAL
  Filled 2014-03-03 (×20): qty 1

## 2014-03-03 MED ORDER — ACETAMINOPHEN 500 MG PO TABS
1000.0000 mg | ORAL_TABLET | Freq: Four times a day (QID) | ORAL | Status: DC | PRN
  Administered 2014-03-03 – 2014-03-07 (×6): 1000 mg via ORAL
  Filled 2014-03-03 (×8): qty 2

## 2014-03-03 MED ORDER — SERTRALINE HCL 50 MG PO TABS
50.0000 mg | ORAL_TABLET | Freq: Every evening | ORAL | Status: DC
  Administered 2014-03-03 – 2014-03-07 (×4): 50 mg via ORAL
  Filled 2014-03-03 (×6): qty 1

## 2014-03-03 MED ORDER — ALBUTEROL SULFATE HFA 108 (90 BASE) MCG/ACT IN AERS: 2.0000 | INHALATION_SPRAY | Freq: Four times a day (QID) | RESPIRATORY_TRACT | Status: DC | PRN

## 2014-03-03 MED ORDER — PREGABALIN 75 MG PO CAPS
75.0000 mg | ORAL_CAPSULE | Freq: Two times a day (BID) | ORAL | Status: DC
  Administered 2014-03-03 – 2014-03-08 (×12): 75 mg via ORAL
  Filled 2014-03-03 (×12): qty 1

## 2014-03-03 MED ORDER — AMIODARONE HCL 200 MG PO TABS
200.0000 mg | ORAL_TABLET | Freq: Every morning | ORAL | Status: DC
  Administered 2014-03-03 – 2014-03-08 (×6): 200 mg via ORAL
  Filled 2014-03-03 (×6): qty 1

## 2014-03-03 MED ORDER — PHYTONADIONE 5 MG PO TABS
5.0000 mg | ORAL_TABLET | Freq: Once | ORAL | Status: AC
  Administered 2014-03-03: 5 mg via ORAL
  Filled 2014-03-03: qty 1

## 2014-03-03 MED ORDER — PREDNISONE 5 MG PO TABS: 5.0000 mg | ORAL_TABLET | Freq: Three times a day (TID) | ORAL | Status: DC

## 2014-03-03 MED ORDER — OXYCODONE HCL 5 MG PO TABS: 5.0000 mg | ORAL_TABLET | ORAL | Status: DC | PRN

## 2014-03-03 MED ORDER — LEVOTHYROXINE SODIUM 100 MCG PO TABS
100.0000 ug | ORAL_TABLET | Freq: Every day | ORAL | Status: DC
  Administered 2014-03-03 – 2014-03-08 (×6): 100 ug via ORAL
  Filled 2014-03-03 (×7): qty 1

## 2014-03-03 MED ORDER — ATENOLOL 50 MG PO TABS
50.0000 mg | ORAL_TABLET | Freq: Two times a day (BID) | ORAL | Status: DC
  Administered 2014-03-03 – 2014-03-08 (×12): 50 mg via ORAL
  Filled 2014-03-03 (×13): qty 1

## 2014-03-03 MED ORDER — LOSARTAN POTASSIUM 50 MG PO TABS
100.0000 mg | ORAL_TABLET | Freq: Every day | ORAL | Status: DC
Start: 2014-03-03 — End: 2014-03-08
  Administered 2014-03-03 – 2014-03-08 (×6): 100 mg via ORAL
  Filled 2014-03-03 (×6): qty 2

## 2014-03-03 MED ORDER — PREDNISONE 20 MG PO TABS
20.0000 mg | ORAL_TABLET | Freq: Two times a day (BID) | ORAL | Status: AC
  Administered 2014-03-03 (×2): 20 mg via ORAL
  Filled 2014-03-03 (×2): qty 1

## 2014-03-03 NOTE — H&P (Signed)
[name]    Chief Complaint: Right leg pain HPI: The patient is a 80 y.o. female presents to ED this evening after losing balance and hyperflexing her right knee with subsequent progressive pain and extreme swelling. She has a remote history of chronic osteomyelitis of the right femur s/p multiple I and D's with presumptive cure, s/p R TKA by Dr. Alvan Dame 02/2012 and initially did well although had very limited motion, by her report only 50 degrees flexion. Developed late infection in right knee in march of 2015, underwent I and D and poly exchange 4/15, IV then po abx, now on oral agent of which she does not recall the name.  Past Medical History  Diagnosis Date  . PAF (paroxysmal atrial fibrillation)     controlled with amiodarone, on coumadin  . CAD (coronary artery disease)   . Aortic root dilatation   . Moderate aortic stenosis   . Chronic anticoagulation   . Tachycardia-bradycardia syndrome     s/p PPM by Dr Doreatha Lew (MDT) 07/03/10  . GERD (gastroesophageal reflux disease)   . Chronic kidney disease   . Pacemaker     2012  . History of nephrectomy     left - due to kidney cancer  . Neuropathy     feet/legs  . Restless leg syndrome   . Headache(784.0)     hx migraines - takes Zoloft for migraines  . History of uterine cancer   . Cancer     hx Kidney cancer / hx of endometrial cancer  . PONV (postoperative nausea and vomiting)   . Anxiety   . Heart murmur   . HTN (hypertension)   . Hyperlipemia   . Hypothyroidism   . 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 Osteomyelitis as child  . Family history of anesthesia complication     daughter has ponv    Past Surgical History  Procedure Laterality Date  . Cardiac catheterization  2008  . Nephrectomy    . Total abdominal hysterectomy    . Patent ductus arterious repair  age 38  . Ptvp    . Pacemaker insertion  07/04/11    by Dr Doreatha Lew  . Cholecystectomy    . Upper gastrointestinal endoscopy    . Total knee arthroplasty  02/07/2012     Procedure: TOTAL KNEE ARTHROPLASTY;  Surgeon: Mauri Pole, MD;  Location: [facility];  Service: Orthopedics;  Laterality: Right;  . Insert / replace / remove pacemaker    . Joint replacement      rt total hip 2009  . I&d knee with poly exchange Right 12/17/2013    Procedure: IRRIGATION AND DEBRIDEMEN RIGHT TOTAL  KNEE WITH POLY EXCHANGE;  Surgeon: Mauri Pole, MD;  Location: [facility];  Service: Orthopedics;  Laterality: Right;    Family History  Problem Relation Age of Onset  . Colon cancer Mother 34  . Heart disease Mother   . Heart disease Father   . Heart attack Father   . Heart disease Maternal Grandmother     Social History:  reports that she has never smoked. She has never used smokeless tobacco. She reports that she does not drink alcohol or use illicit drugs.  Allergies:  Allergies  Allergen Reactions  . Codeine Nausea And Vomiting  . Morphine And Related Nausea And Vomiting  . Requip [Ropinirole Hcl]     Headache   . Zinc     nausea    (Not in a hospital admission)   Physical  Exam: thin WF, A and O, c/o right leg pain. Multiple healed scars right hip and thigh and knee, marked swelling from distal femur to mid tibia, no erythema, degree of swelling precludes ability to palpate extensor mechanism, able to SLR with 20 degree extensor lag, n/v intact distally, 4/5 ankle and digital dorsiflexors, 1+DP pulse   Xrays: no acute fracture, patella alta concerning for extensor mechanism rupture  Vitals  Temp:  [98.3 F (36.8 C)] 98.3 F (36.8 C) (06/27 1839) Pulse Rate:  [76] 76 (06/27 1839) Resp:  [19-20] 20 (06/27 2232) BP: (193)/(101) 193/101 mmHg (06/27 1839) SpO2:  [94 %-99 %] 94 % (06/27 2232)  Assessment/Plan  Impression: Acute right knee swelling with probable extensor mechanism rupture. Over anti-coagulation Recent I and D of periprosthetic infection r knee, on oral ABX Chronic contracture R TKA Recent Dx of vestibular dysfunction on steroids  Plan  of Action:  Admit for pain control Correct over-anticoagulation: EDP will initiate vitamin K, hold coumadin Ultrasound to evaluate extensor mechanism  SUPPLE,KEVIN M 03/03/2014, 12:34 AM

## 2014-03-03 NOTE — ED Provider Notes (Signed)
Medical screening examination/treatment/procedure(s) were conducted as a shared visit with non-physician practitioner(s) and myself.  I personally evaluated the patient during the encounter.   EKG Interpretation None      Patient was observed in the emergency department for several hours.  There is initial concern for compartment syndrome however after my initial discussion with orthopedic surgery we chose to watch the patient in the ER closely.  We elevated the leg and apply ice.  Patient continued to have ongoing discomfort and pain as well as what appeared to be worsening swelling of the anterior compartment and therefore I spoke with orthopedic surgery who evaluated the patient emergency department for the possibility of compartment syndrome.  Suspicion for compartment syndrome is lower this time by orthopedic surgery but they will admit for serial exams and observation.  The thought is a possible ruptured patella tendon as the cause of her swelling.  Patient be given 5 mg oral vitamin K at this time to reverse her Coumadin.  She has ongoing normal pulses in her right foot.  I appreciate the assistance of orthopedic consultation in this difficult case  Dg Tibia/fibula Right  03/02/2014   CLINICAL DATA:  Right knee pain.  EXAM: RIGHT TIBIA AND FIBULA - 2 VIEW  COMPARISON:  None.  FINDINGS: Status post right total knee arthroplasty. No fracture or dislocation is noted.  IMPRESSION: No acute abnormality seen in the right tibia or fibula.   Electronically Signed   By: Sabino Dick M.D.   On: 03/02/2014 19:38   Dg Knee Complete 4 Views Right  03/02/2014   CLINICAL DATA:  Right knee pain after injury.  EXAM: RIGHT KNEE - COMPLETE 4+ VIEW  COMPARISON:  September 08, 2007.  FINDINGS: Status post right total knee arthroplasty. The femoral and tibial components appear to be well situated. No acute fracture or dislocation is noted.  IMPRESSION: Status post right total knee arthroplasty. No acute abnormality seen.    Electronically Signed   By: Sabino Dick M.D.   On: 03/02/2014 19:37  I personally reviewed the imaging tests through PACS system I reviewed available ER/hospitalization records through the [facility], MD 03/03/14 [phone]

## 2014-03-03 NOTE — Progress Notes (Signed)
[name]  MRN: [id number] DOB/Age: [birth year] 78 y.o. Physician: Rada Hay Procedure:       Subjective: Eating breakfast. Pain controlled, ultrasound still pending  Vital Signs Temp:  [97.8 F (36.6 C)-98.3 F (36.8 C)] 97.8 F (36.6 C) (06/28 0600) Pulse Rate:  [60-79] 60 (06/28 0600) Resp:  [18-20] 18 (06/28 0600) BP: (134-193)/(61-101) 144/66 mmHg (06/28 0600) SpO2:  [94 %-99 %] 94 % (06/28 0600) Weight:  [60.782 kg (134 lb)] 60.782 kg (134 lb) (06/28 0200)  Lab Results  Recent Labs  03/02/14 2029  WBC 9.8  HGB 12.2  HCT 36.9  PLT 160   BMET  Recent Labs  03/02/14 2029  NA 140  K 4.6  CL 107  CO2 22  GLUCOSE 101*  BUN 53*  CREATININE 1.36*  CALCIUM 8.3*   INR  Date Value Ref Range Status  03/02/2014 4.60* 0.00 - 1.49 Final  02/22/2014 4.5   Final     Exam Right lower extremity, NVI but significant weakness in extension with inability to maintain leg against gravity        Plan Await ultrasound Will keep NPO after midnight in case of need for surgery and until evaluated by Dr. Laurena Spies for Dr.Kevin Supple 03/03/2014, 8:06 AM

## 2014-03-03 NOTE — Progress Notes (Addendum)
Pt refused Colace as she is having watery diarrhea. Med list shows Flora Q 1 cap Bid.Marland KitchenMarland KitchenMarland KitchenMarland Kitchen which is not ordered now????

## 2014-03-04 ENCOUNTER — Inpatient Hospital Stay (HOSPITAL_COMMUNITY): Payer: Medicare Other

## 2014-03-04 NOTE — Progress Notes (Signed)
Patient ID: [name], female   DOB: [birth year], 80 y.o.   MRN: [id number]  Doing OK, comfortable, pain around knee only  Seen and examined after weekend admission.  Reviewed admission injury which sounds like a forced hyper-flexion injury when she really cant tolerate that. Confounded by Coumadin use for a-fib  INR significantly elevated still greater that 3.8  Exam:  Significant left lower extremity swelling and and bruising Moves toes and foot passively and actively without increase pain  Appears that swelling is extra fascial in the leg but not causing problems with her compartments  Will allow INR to trend down Dr. Onnie Graham had ordered an U/S that is to be done today.  Will use this to identify if she avulsed patella tendon off the tubercle versus off end of patella  Surgery pending normalized INR, U/S results - non emergent thus far, reviewed concerns with she and her daughter Regular diet for now

## 2014-03-04 NOTE — Progress Notes (Signed)
CARE MANAGEMENT NOTE 03/04/2014  Patient:  TANGI, SHROFF   Account Number:  [id number]  Date Initiated:  03/04/2014  Documentation initiated by:  DAVIS,RHONDA  Subjective/Objective Assessment:   pt with right knee hyper flexion injury will need or but being held due to inr greater than 3.0=pt has hx of a.fib and on counmadin-currently being held/     Action/Plan:   home when stable versus snf/patient lives alone.   Anticipated DC Date:  03/07/2014   Anticipated DC Plan:  SKILLED NURSING FACILITY  In-house referral  NA      DC Planning Services  NA      [facility] Choice  NA   Choice offered to / List presented to:  NA   DME arranged  NA      DME agency  NA     [facility] arranged  NA      [place]   Status of service:   Medicare Important Message given?  NA - LOS <3 / Initial given by admissions (If response is "NO", the following Medicare IM given date fields will be blank) Date Medicare IM given:   Date Additional Medicare IM given:    Discharge Disposition:    Per UR Regulation:  Reviewed for med. necessity/level of care/duration of stay  If discussed at Long Length of Stay Meetings, dates discussed:    Comments:  Rhonda Davis,RN,BSN,CCM: discussed case with medical direct Dr. Jiles Harold as inpatient due to inr and need for or/ Next review due on [id number] IM due on [id number].

## 2014-03-05 LAB — PROTIME-INR
INR: 1.22 (ref 0.00–1.49)
Prothrombin Time: 15.4 seconds — ABNORMAL HIGH (ref 11.6–15.2)

## 2014-03-05 NOTE — Progress Notes (Signed)
CSW met with pt / family to assist with d/c planning. Surgery is pending. Pt /family are not sure what will be needed at d/c. SNF placement vs Home with [facility] services reviewed. Pt/family requested CSW to return following surgery / PT recommendations when needs will be more apparent. CSW will return as requested.  Werner Lean LCSW [phone]

## 2014-03-06 ENCOUNTER — Encounter (HOSPITAL_COMMUNITY): Payer: Self-pay | Admitting: Certified Registered Nurse Anesthetist

## 2014-03-06 ENCOUNTER — Encounter (HOSPITAL_COMMUNITY)
Admission: EM | Disposition: A | Discharge status: Home Health | Payer: Self-pay | Source: Home / Self Care | Attending: Orthopedic Surgery

## 2014-03-06 ENCOUNTER — Encounter (HOSPITAL_COMMUNITY): Payer: Medicare Other | Admitting: Certified Registered Nurse Anesthetist

## 2014-03-06 ENCOUNTER — Inpatient Hospital Stay (HOSPITAL_COMMUNITY): Payer: Medicare Other | Admitting: Certified Registered Nurse Anesthetist

## 2014-03-06 HISTORY — PX: PATELLAR TENDON REPAIR: SHX737

## 2014-03-06 LAB — SURGICAL PCR SCREEN
MRSA, PCR: NEGATIVE
STAPHYLOCOCCUS AUREUS: NEGATIVE

## 2014-03-06 LAB — PROTIME-INR
INR: 1.24 (ref 0.00–1.49)
Prothrombin Time: 15.6 seconds — ABNORMAL HIGH (ref 11.6–15.2)

## 2014-03-06 SURGERY — REPAIR, TENDON, PATELLAR
Anesthesia: General | Site: Knee | Laterality: Right

## 2014-03-06 MED ORDER — ONDANSETRON HCL 4 MG/2ML IJ SOLN: 4.0000 mg | Freq: Four times a day (QID) | INTRAMUSCULAR | Status: DC | PRN

## 2014-03-06 MED ORDER — SUCCINYLCHOLINE CHLORIDE 20 MG/ML IJ SOLN
INTRAMUSCULAR | Status: DC | PRN
  Administered 2014-03-06: 100 mg via INTRAVENOUS

## 2014-03-06 MED ORDER — PROPOFOL 10 MG/ML IV BOLUS
INTRAVENOUS | Status: DC | PRN
  Administered 2014-03-06: 110 mg via INTRAVENOUS
  Administered 2014-03-06: 40 mg via INTRAVENOUS

## 2014-03-06 MED ORDER — SODIUM CHLORIDE 0.9 % IV SOLN
INTRAVENOUS | Status: DC | PRN
  Administered 2014-03-06: 18:00:00 via INTRAVENOUS

## 2014-03-06 MED ORDER — 0.9 % SODIUM CHLORIDE (POUR BTL) OPTIME
TOPICAL | Status: DC | PRN
  Administered 2014-03-06: 1000 mL

## 2014-03-06 MED ORDER — FENTANYL CITRATE 0.05 MG/ML IJ SOLN
INTRAMUSCULAR | Status: AC
  Filled 2014-03-06: qty 2

## 2014-03-06 MED ORDER — ONDANSETRON HCL 4 MG PO TABS: 4.0000 mg | ORAL_TABLET | Freq: Four times a day (QID) | ORAL | Status: DC | PRN

## 2014-03-06 MED ORDER — PHENYLEPHRINE 40 MCG/ML (10ML) SYRINGE FOR IV PUSH (FOR BLOOD PRESSURE SUPPORT)
PREFILLED_SYRINGE | INTRAVENOUS | Status: AC
  Filled 2014-03-06: qty 10

## 2014-03-06 MED ORDER — PROMETHAZINE HCL 25 MG/ML IJ SOLN: 6.2500 mg | INTRAMUSCULAR | Status: DC | PRN

## 2014-03-06 MED ORDER — NEOSTIGMINE METHYLSULFATE 10 MG/10ML IV SOLN
INTRAVENOUS | Status: DC | PRN
Start: 2014-03-06 — End: 2014-03-06
  Administered 2014-03-06: 3 mg via INTRAVENOUS

## 2014-03-06 MED ORDER — FENTANYL CITRATE 0.05 MG/ML IJ SOLN
INTRAMUSCULAR | Status: DC | PRN
  Administered 2014-03-06 (×2): 50 ug via INTRAVENOUS
  Administered 2014-03-06 (×2): 25 ug via INTRAVENOUS
  Administered 2014-03-06: 50 ug via INTRAVENOUS

## 2014-03-06 MED ORDER — HYDROMORPHONE HCL PF 1 MG/ML IJ SOLN: 0.5000 mg | INTRAMUSCULAR | Status: DC | PRN

## 2014-03-06 MED ORDER — ACETAMINOPHEN 10 MG/ML IV SOLN
1000.0000 mg | Freq: Once | INTRAVENOUS | Status: AC
  Administered 2014-03-06: 1000 mg via INTRAVENOUS
  Filled 2014-03-06: qty 100

## 2014-03-06 MED ORDER — LIDOCAINE HCL (CARDIAC) 20 MG/ML IV SOLN
INTRAVENOUS | Status: DC | PRN
  Administered 2014-03-06: 50 mg via INTRAVENOUS

## 2014-03-06 MED ORDER — ONDANSETRON HCL 4 MG/2ML IJ SOLN
INTRAMUSCULAR | Status: DC | PRN
  Administered 2014-03-06: 4 mg via INTRAVENOUS

## 2014-03-06 MED ORDER — CEFAZOLIN SODIUM-DEXTROSE 2-3 GM-% IV SOLR
INTRAVENOUS | Status: DC | PRN
  Administered 2014-03-06: 2 g via INTRAVENOUS

## 2014-03-06 MED ORDER — PROPOFOL 10 MG/ML IV BOLUS
INTRAVENOUS | Status: AC
  Filled 2014-03-06: qty 20

## 2014-03-06 MED ORDER — GLYCOPYRROLATE 0.2 MG/ML IJ SOLN
INTRAMUSCULAR | Status: DC | PRN
  Administered 2014-03-06: 0.4 mg via INTRAVENOUS

## 2014-03-06 MED ORDER — PHENYLEPHRINE HCL 10 MG/ML IJ SOLN
INTRAMUSCULAR | Status: DC | PRN
  Administered 2014-03-06 (×3): 80 ug via INTRAVENOUS
  Administered 2014-03-06 (×2): 40 ug via INTRAVENOUS

## 2014-03-06 MED ORDER — LIDOCAINE HCL (CARDIAC) 20 MG/ML IV SOLN
INTRAVENOUS | Status: AC
  Filled 2014-03-06: qty 5

## 2014-03-06 MED ORDER — DEXAMETHASONE SODIUM PHOSPHATE 10 MG/ML IJ SOLN
INTRAMUSCULAR | Status: DC | PRN
  Administered 2014-03-06: 10 mg via INTRAVENOUS

## 2014-03-06 MED ORDER — FENTANYL CITRATE 0.05 MG/ML IJ SOLN: 25.0000 ug | INTRAMUSCULAR | Status: DC | PRN

## 2014-03-06 MED ORDER — ROCURONIUM BROMIDE 100 MG/10ML IV SOLN
INTRAVENOUS | Status: DC | PRN
  Administered 2014-03-06: 20 mg via INTRAVENOUS

## 2014-03-06 MED ORDER — HYDRALAZINE HCL 20 MG/ML IJ SOLN
INTRAMUSCULAR | Status: AC
  Filled 2014-03-06: qty 1

## 2014-03-06 MED ORDER — BUPIVACAINE-EPINEPHRINE (PF) 0.25% -1:200000 IJ SOLN
INTRAMUSCULAR | Status: AC
  Filled 2014-03-06: qty 30

## 2014-03-06 MED ORDER — CEFAZOLIN SODIUM 1-5 GM-% IV SOLN
1.0000 g | Freq: Four times a day (QID) | INTRAVENOUS | Status: AC
  Administered 2014-03-06 – 2014-03-07 (×3): 1 g via INTRAVENOUS
  Filled 2014-03-06 (×3): qty 50

## 2014-03-06 MED ORDER — METOCLOPRAMIDE HCL 10 MG PO TABS: 5.0000 mg | ORAL_TABLET | Freq: Three times a day (TID) | ORAL | Status: DC | PRN

## 2014-03-06 MED ORDER — LABETALOL HCL 5 MG/ML IV SOLN
INTRAVENOUS | Status: DC | PRN
  Administered 2014-03-06: 5 mg via INTRAVENOUS

## 2014-03-06 MED ORDER — HYDRALAZINE HCL 20 MG/ML IJ SOLN
5.0000 mg | Freq: Once | INTRAMUSCULAR | Status: AC
  Administered 2014-03-06: 5 mg via INTRAVENOUS

## 2014-03-06 MED ORDER — SODIUM CHLORIDE 0.9 % IJ SOLN
INTRAMUSCULAR | Status: AC
  Filled 2014-03-06: qty 10

## 2014-03-06 MED ORDER — METHOCARBAMOL 1000 MG/10ML IJ SOLN
500.0000 mg | Freq: Four times a day (QID) | INTRAMUSCULAR | Status: DC | PRN
  Filled 2014-03-06: qty 5

## 2014-03-06 MED ORDER — METOCLOPRAMIDE HCL 5 MG/ML IJ SOLN: 5.0000 mg | Freq: Three times a day (TID) | INTRAMUSCULAR | Status: DC | PRN

## 2014-03-06 MED ORDER — SODIUM CHLORIDE 0.9 % IV SOLN
INTRAVENOUS | Status: DC
  Administered 2014-03-06 (×2): via INTRAVENOUS

## 2014-03-06 MED ORDER — ONDANSETRON HCL 4 MG/2ML IJ SOLN
4.0000 mg | Freq: Four times a day (QID) | INTRAMUSCULAR | Status: DC | PRN
  Administered 2014-03-06 – 2014-03-07 (×2): 4 mg via INTRAVENOUS
  Filled 2014-03-06: qty 2

## 2014-03-06 MED ORDER — ONDANSETRON HCL 4 MG/2ML IJ SOLN
INTRAMUSCULAR | Status: AC
  Filled 2014-03-06: qty 2

## 2014-03-06 MED ORDER — BUPIVACAINE LIPOSOME 1.3 % IJ SUSP
20.0000 mL | Freq: Once | INTRAMUSCULAR | Status: DC
  Filled 2014-03-06: qty 20

## 2014-03-06 MED ORDER — DOCUSATE SODIUM 100 MG PO CAPS
100.0000 mg | ORAL_CAPSULE | Freq: Two times a day (BID) | ORAL | Status: DC
  Administered 2014-03-07 – 2014-03-08 (×2): 100 mg via ORAL

## 2014-03-06 MED ORDER — CEFAZOLIN SODIUM-DEXTROSE 2-3 GM-% IV SOLR
INTRAVENOUS | Status: AC
  Filled 2014-03-06: qty 50

## 2014-03-06 MED ORDER — KETOROLAC TROMETHAMINE 30 MG/ML IJ SOLN
INTRAMUSCULAR | Status: AC
  Filled 2014-03-06: qty 1

## 2014-03-06 MED ORDER — METHOCARBAMOL 500 MG PO TABS
500.0000 mg | ORAL_TABLET | Freq: Four times a day (QID) | ORAL | Status: DC | PRN
  Administered 2014-03-06 – 2014-03-07 (×3): 500 mg via ORAL
  Filled 2014-03-06 (×4): qty 1

## 2014-03-06 MED ORDER — SODIUM CHLORIDE 0.9 % IR SOLN
Status: DC | PRN
  Administered 2014-03-06: 3000 mL

## 2014-03-06 SURGICAL SUPPLY — 59 items
ANCHOR SUPER QUICK (Anchor) ×6 IMPLANT
BAG ZIPLOCK 12X15 (MISCELLANEOUS) IMPLANT
BANDAGE ELASTIC 6 VELCRO ST LF (GAUZE/BANDAGES/DRESSINGS) ×3 IMPLANT
BANDAGE ESMARK 6X9 LF (GAUZE/BANDAGES/DRESSINGS) ×1 IMPLANT
BIT DRILL 2.4X128 (BIT) IMPLANT
BIT DRILL 2.4X128MM (BIT)
BNDG ESMARK 6X9 LF (GAUZE/BANDAGES/DRESSINGS) ×3
BNDG GAUZE ELAST 4 BULKY (GAUZE/BANDAGES/DRESSINGS) ×3 IMPLANT
CUFF TOURN SGL QUICK 34 (TOURNIQUET CUFF) ×2
CUFF TRNQT CYL 34X4X40X1 (TOURNIQUET CUFF) ×1 IMPLANT
DERMABOND ADVANCED (GAUZE/BANDAGES/DRESSINGS)
DERMABOND ADVANCED .7 DNX12 (GAUZE/BANDAGES/DRESSINGS) IMPLANT
DRAPE EXTREMITY T 121X128X90 (DRAPE) ×3 IMPLANT
DRSG AQUACEL AG ADV 3.5X10 (GAUZE/BANDAGES/DRESSINGS) IMPLANT
DRSG PAD ABDOMINAL 8X10 ST (GAUZE/BANDAGES/DRESSINGS) ×6 IMPLANT
DURAPREP 26ML APPLICATOR (WOUND CARE) ×3 IMPLANT
ELECT REM PT RETURN 9FT ADLT (ELECTROSURGICAL) ×3
ELECTRODE REM PT RTRN 9FT ADLT (ELECTROSURGICAL) ×1 IMPLANT
FACESHIELD WRAPAROUND (MASK) ×3 IMPLANT
GAUZE XEROFORM 5X9 LF (GAUZE/BANDAGES/DRESSINGS) ×3 IMPLANT
GLOVE BIOGEL PI IND STRL 7.5 (GLOVE) ×1 IMPLANT
GLOVE BIOGEL PI IND STRL 8 (GLOVE) IMPLANT
GLOVE BIOGEL PI INDICATOR 7.5 (GLOVE) ×2
GLOVE BIOGEL PI INDICATOR 8 (GLOVE)
GLOVE ECLIPSE 8.0 STRL XLNG CF (GLOVE) IMPLANT
GLOVE ORTHO TXT STRL SZ7.5 (GLOVE) ×6 IMPLANT
GOWN SPEC L3 XXLG W/TWL (GOWN DISPOSABLE) ×6 IMPLANT
GOWN STRL REUS W/TWL 2XL LVL3 (GOWN DISPOSABLE) IMPLANT
GOWN STRL REUS W/TWL LRG LVL3 (GOWN DISPOSABLE) ×6 IMPLANT
HANDPIECE INTERPULSE COAX TIP (DISPOSABLE) ×2
IMMOBILIZER KNEE 20 (SOFTGOODS) ×6 IMPLANT
IMMOBILIZER KNEE 20 THIGH 36 (SOFTGOODS) ×1 IMPLANT
IV NS IRRIG 3000ML ARTHROMATIC (IV SOLUTION) ×3 IMPLANT
MANIFOLD NEPTUNE II (INSTRUMENTS) ×3 IMPLANT
NS IRRIG 1000ML POUR BTL (IV SOLUTION) ×3 IMPLANT
PACK TOTAL JOINT (CUSTOM PROCEDURE TRAY) ×3 IMPLANT
PAD ABD 8X10 STRL (GAUZE/BANDAGES/DRESSINGS) ×6 IMPLANT
PASSER SUT SWANSON 36MM LOOP (INSTRUMENTS) ×3 IMPLANT
POSITIONER SURGICAL ARM (MISCELLANEOUS) ×3 IMPLANT
SCREW CANN L THRD/30 4.0 (Screw) ×3 IMPLANT
SET HNDPC FAN SPRY TIP SCT (DISPOSABLE) ×1 IMPLANT
SPONGE GAUZE 4X4 12PLY (GAUZE/BANDAGES/DRESSINGS) ×3 IMPLANT
STAPLER VISISTAT 35W (STAPLE) ×3 IMPLANT
SUT ETHIBOND NAB CT1 #1 30IN (SUTURE) ×3 IMPLANT
SUT FIBERWIRE #2 38 T-5 BLUE (SUTURE) ×6
SUT FIBERWIRE 2-0 18 17.9 3/8 (SUTURE) ×6
SUT MNCRL AB 4-0 PS2 18 (SUTURE) ×3 IMPLANT
SUT VIC AB 0 CT1 27 (SUTURE)
SUT VIC AB 0 CT1 27XBRD ANTBC (SUTURE) IMPLANT
SUT VIC AB 1 CT1 27 (SUTURE) ×6
SUT VIC AB 1 CT1 27XBRD ANTBC (SUTURE) ×3 IMPLANT
SUT VIC AB 2-0 CT1 27 (SUTURE) ×4
SUT VIC AB 2-0 CT1 TAPERPNT 27 (SUTURE) ×2 IMPLANT
SUTURE FIBERWR #2 38 T-5 BLUE (SUTURE) ×2 IMPLANT
SUTURE FIBERWR 2-0 18 17.9 3/8 (SUTURE) ×2 IMPLANT
TOWEL OR 17X26 10 PK STRL BLUE (TOWEL DISPOSABLE) ×6 IMPLANT
TOWEL OR NON WOVEN STRL DISP B (DISPOSABLE) ×3 IMPLANT
WATER STERILE IRR 1500ML POUR (IV SOLUTION) IMPLANT
WRAP KNEE MAXI GEL POST OP (GAUZE/BANDAGES/DRESSINGS) ×3 IMPLANT

## 2014-03-06 NOTE — H&P (View-Only) (Signed)
   Subjective: Right patella tendon rupture   Patient reports pain as moderate. States really only has pain when pushed hard on the right knee. Significant amount of bruising do to her use of Coumadin.  Objective:   VITALS:   Filed Vitals:   03/06/14 0602  BP: 162/73  Pulse: 63  Temp: 98.2 F (36.8 C)  Resp: 18    Neurovascular intact No cellulitis present Compartment soft    LABS INR 1.24    Assessment/Plan: Right patella tendon rupture   NPO now. Will obtain consent for open repair of right extensor mechanism per Dr. Alvan Dame. Surgery planned for later today, planning near 1800.    West Pugh Gwendlyn Hanback   PAC  03/06/2014, 9:32 AM

## 2014-03-06 NOTE — Progress Notes (Signed)
   Subjective: Right patella tendon rupture   Patient reports pain as moderate. States really only has pain when pushed hard on the right knee. Significant amount of bruising do to her use of Coumadin.  Objective:   VITALS:   Filed Vitals:   03/06/14 0602  BP: 162/73  Pulse: 63  Temp: 98.2 F (36.8 C)  Resp: 18    Neurovascular intact No cellulitis present Compartment soft    LABS INR 1.24    Assessment/Plan: Right patella tendon rupture   NPO now. Will obtain consent for open repair of right extensor mechanism per Dr. Alvan Dame. Surgery planned for later today, planning near 1800.    [name] [name]   PAC  03/06/2014, 9:32 AM

## 2014-03-06 NOTE — Brief Op Note (Signed)
03/02/2014 - 03/06/2014  8:22 PM  PATIENT:  [name]  80 y.o. female  PRE-OPERATIVE DIAGNOSIS:  right patella tendon avulsion from tibial turbercle  POST-OPERATIVE DIAGNOSIS:  right patella tendon avulsion from tibial turbercle  PROCEDURE:  Procedure(s): Repair right patellar tendon avulsion  SURGEON:  Surgeon(s) and Role:    * Mauri Pole, MD - Primary  PHYSICIAN ASSISTANT: None  ANESTHESIA:   general  EBL:  Total I/O In: 900 [I.V.:900] Out: 5 [Blood:5]  BLOOD ADMINISTERED:none  DRAINS: none   LOCAL MEDICATIONS USED:  NONE  SPECIMEN:  No Specimen  DISPOSITION OF SPECIMEN:  N/A  COUNTS:  YES  TOURNIQUET:   Total Tourniquet Time Documented: Thigh (Right) - 59 minutes Total: Thigh (Right) - 59 minutes   DICTATION: .Other Dictation: Dictation Number [id number]  PLAN OF CARE: Admit to inpatient   PATIENT DISPOSITION:  PACU - hemodynamically stable.   Delay start of Pharmacological VTE agent (>24hrs) due to surgical blood loss or risk of bleeding: no

## 2014-03-06 NOTE — Transfer of Care (Signed)
Immediate Anesthesia Transfer of Care Note  Patient: [name]  Procedure(s) Performed: Procedure(s) (LRB): AVULSION WITH PATELLA TENDON REPAIR (Right)  Patient Location: PACU  Anesthesia Type: General  Level of Consciousness: sedated, patient cooperative and responds to stimulation  Airway & Oxygen Therapy: Patient Spontanous Breathing and Patient connected to face mask oxgen  Post-op Assessment: Report given to PACU RN and Post -op Vital signs reviewed and stable  Post vital signs: Reviewed and stable  Complications: No apparent anesthesia complications

## 2014-03-06 NOTE — Progress Notes (Signed)
Spoke to [employer] PA regarding patient's need for DVT prevention.  Received new order for foot pumps which where placed on patient.  Will continue to monitor.

## 2014-03-06 NOTE — Anesthesia Preprocedure Evaluation (Signed)
Anesthesia Evaluation  Patient identified by MRN, date of birth, ID band Patient awake  General Assessment Comment:  PAF (paroxysmal atrial fibrillation)         controlled with amiodarone, on coumadin   .  CAD (coronary artery disease)     .  Aortic root dilatation     .  Moderate aortic stenosis     .  Chronic anticoagulation     .  Tachycardia-bradycardia syndrome         s/p PPM by Dr Doreatha Lew (MDT) 07/03/10   .  GERD (gastroesophageal reflux disease)     .  Chronic kidney disease     .  Pacemaker         2012   .  History of nephrectomy         left - due to kidney cancer   .  Neuropathy         feet/legs   .  Restless leg syndrome     .  Headache(784.0)         hx migraines - takes Zoloft for migraines   .  History of uterine cancer     .  Cancer         hx Kidney cancer / hx of endometrial cancer   .  PONV (postoperative nausea and vomiting)     .  Anxiety     .  Heart murmur     .  HTN (hypertension)     .  Hyperlipemia     .  Hypothyroidism     .  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  Osteomyelitis  as child   .  Family history of anesthesia complication         daughter has ponv     Reviewed: Allergy & Precautions, H&P , NPO status , Patient's Chart, lab work & pertinent test results  History of Anesthesia Complications (+) PONV, Family history of anesthesia reaction and history of anesthetic complications  Airway Mallampati: II TM Distance: >3 FB Neck ROM: Full    Dental no notable dental hx.    Pulmonary neg pulmonary ROS,  breath sounds clear to auscultation  Pulmonary exam normal       Cardiovascular Exercise Tolerance: Good hypertension, Pt. on medications and Pt. on home beta blockers + CAD and + Peripheral Vascular Disease + dysrhythmias Atrial Fibrillation + pacemaker + Valvular Problems/Murmurs AS Rhythm:Regular Rate:Normal     Neuro/Psych  Headaches, PSYCHIATRIC DISORDERS Anxiety Depression    GI/Hepatic Neg liver ROS, GERD-  Medicated,  Endo/Other  Hypothyroidism   Renal/GU Renal disease  negative genitourinary   Musculoskeletal negative musculoskeletal ROS (+)   Abdominal   Peds negative pediatric ROS (+)  Hematology negative hematology ROS (+)   Anesthesia Other Findings   Reproductive/Obstetrics negative OB ROS                           Anesthesia Physical Anesthesia Plan  ASA: III  Anesthesia Plan: General   Post-op Pain Management:    Induction: Intravenous  Airway Management Planned:   Additional Equipment:   Intra-op Plan:   Post-operative Plan: Extubation in OR  Informed Consent: I have reviewed the patients History and Physical, chart, labs and discussed the procedure including the risks, benefits and alternatives for the proposed anesthesia with the patient or authorized representative who has indicated his/her understanding and acceptance.   Dental advisory given  Plan Discussed  with: CRNA  Anesthesia Plan Comments: (Discussed general and spinal. She had a failed spinal last time and the attempted placement was painful. She prefers general today. INR 1.24)        Anesthesia Quick Evaluation

## 2014-03-06 NOTE — Interval H&P Note (Signed)
History and Physical Interval Note:  03/06/2014 6:28 PM  [name]  has presented today for surgery, with the diagnosis of right patella tendon rupture  The various methods of treatment have been discussed with the patient and family. After consideration of risks, benefits and other options for treatment, the patient has consented to  Procedure(s): PATELLA TENDON REPAIR (Right) as a surgical intervention .  The patient's history has been reviewed, patient examined, no change in status, stable for surgery.  I have reviewed the patient's chart and labs.  Questions were answered to the patient's satisfaction.     Mauri Pole

## 2014-03-07 ENCOUNTER — Encounter (HOSPITAL_COMMUNITY): Payer: Self-pay | Admitting: Orthopedic Surgery

## 2014-03-07 LAB — PROTIME-INR
INR: 1.16 (ref 0.00–1.49)
Prothrombin Time: 14.8 seconds (ref 11.6–15.2)

## 2014-03-07 MED ORDER — TRAMADOL HCL 50 MG PO TABS
50.0000 mg | ORAL_TABLET | Freq: Two times a day (BID) | ORAL | Status: DC | PRN
  Administered 2014-03-07: 50 mg via ORAL
  Filled 2014-03-07: qty 1

## 2014-03-07 MED ORDER — WARFARIN SODIUM 2.5 MG PO TABS
2.5000 mg | ORAL_TABLET | Freq: Once | ORAL | Status: AC
  Administered 2014-03-07: 2.5 mg via ORAL
  Filled 2014-03-07: qty 1

## 2014-03-07 MED ORDER — ENOXAPARIN SODIUM 40 MG/0.4ML ~~LOC~~ SOLN: 40.0000 mg | SUBCUTANEOUS | Status: DC

## 2014-03-07 MED ORDER — WARFARIN - PHARMACIST DOSING INPATIENT: Freq: Every day | Status: DC

## 2014-03-07 NOTE — Anesthesia Postprocedure Evaluation (Signed)
  Anesthesia Post-op Note  Patient: [name]  Procedure(s) Performed: Procedure(s) (LRB): AVULSION WITH PATELLA TENDON REPAIR (Right)  Patient Location: PACU  Anesthesia Type: General  Level of Consciousness: awake and alert   Airway and Oxygen Therapy: Patient Spontanous Breathing  Post-op Pain: mild  Post-op Assessment: Post-op Vital signs reviewed, Patient's Cardiovascular Status Stable, Respiratory Function Stable, Patent Airway and No signs of Nausea or vomiting  Last Vitals:  Filed Vitals:   03/07/14 0800  BP:   Pulse:   Temp:   Resp: 97    Post-op Vital Signs: stable   Complications: No apparent anesthesia complications

## 2014-03-07 NOTE — Progress Notes (Signed)
CARE MANAGEMENT NOTE 03/07/2014  Patient:  [name], [name]   Account Number:  [id number]  Date Initiated:  03/04/2014  Documentation initiated by:  DAVIS,RHONDA  Subjective/Objective Assessment:   pt with right knee hyper flexion injury will need or but being held due to inr greater than 3.0=pt has hx of a.fib and on counmadin-currently being held/     Action/Plan:   home when stable versus snf/patient lives alone.   Anticipated DC Date:  03/10/2014   Anticipated DC Plan:  [place]  In-house referral  NA      DC Planning Services  CM consult      [facility] Choice  NA   Choice offered to / List presented to:  C-1 Patient   DME arranged  NA      DME agency  NA     [place] arranged  HH-1 RN  [place].   Status of service:  In process, will continue to follow Medicare Important Message given?  NA - LOS <3 / Initial given by admissions (If response is "NO", the following Medicare IM given date fields will be blank) Date Medicare IM given:   Medicare IM given by:   Date Additional Medicare IM given:   Additional Medicare IM given by:    Discharge Disposition:    Per UR Regulation:  Reviewed for med. necessity/level of care/duration of stay  If discussed at Long Length of Stay Meetings, dates discussed:    Comments:  Suanne Marker Davis,RN,BSN,CCM: pt to or [id number]/inr stable/ to home on [id number] with coumadin and hhc/ Rhonda Davis,RN,BSN,CCM: discussed case with medical direct Dr. Jiles Harold as inpatient due to inr and need for or/ Next review due on [id number] IM due on [id number].

## 2014-03-07 NOTE — Progress Notes (Signed)
   Subjective: 1 Day Post-Op Procedure(s) (LRB): AVULSION WITH PATELLA TENDON REPAIR (Right)   Patient reports pain as mild.  Objective:   VITALS:   Filed Vitals:   03/07/14 1019  BP: 117/73  Pulse: 66  Temp: 98.6 F (37 C)  Resp: 18    Neurovascular intact Dorsiflexion/Plantar flexion intact Incision: dressing C/D/I No cellulitis present Compartment soft  LABS INR  1.16   Assessment/Plan: 1 Day Post-Op Procedure(s) (LRB): AVULSION WITH PATELLA TENDON REPAIR (Right) WBAT right leg KI on right leg at all times. Up with therapy Discharge home with home health eventually, when ready      West Pugh. Gianni Mihalik   PAC  03/07/2014, 10:45 AM

## 2014-03-07 NOTE — Op Note (Signed)
[name], [name]:  [id number]  MEDICAL RECORD NO.:  [id number]  LOCATION:  37                         FACILITY:  [facility]  PHYSICIAN:  Pietro Cassis. Alvan Dame, M.D.  DATE OF BIRTH:  1933/12/04  DATE OF PROCEDURE:  03/06/2014 DATE OF DISCHARGE:                              OPERATIVE REPORT   PREOPERATIVE DIAGNOSIS:  Right patellar tendon avulsion.  POSTOPERATIVE DIAGNOSIS/FINDINGS:  Patella tendon avulsion off of the tibial tubercle with significant right leg swelling postoperative related to over anticoagulation.  PROCEDURE:  Open repair of right patella avulsion utilizing #2 FiberWire sutures, a DePuy suture anchor with #2 Ethibond suture, as well as a 4.0 cannulated partially threaded cancellous screw utilizes a suture post.  SURGEON:  Pietro Cassis. Alvan Dame, MD  ASSISTANT:  Surgical team.  ANESTHESIA:  General.  SPECIMENS:  None.  COMPLICATION:  None.  TOURNIQUET TIME:  Fifty-nine minutes at 250 mmHg.  INDICATIONS FOR PROCEDURE:  [name] is a pleasant 80 year old female, patient of mine, recently admitted on December 17, 2013, concerns for cellulitis and infection in her joint.  She underwent an I and D of her knee with poly exchange.  She had been doing well and then had been out participating in normal activities of daily living.  When she was at a corn mill, and she slipped and had a hyperflexion injury with the medial onset of pain, swelling in the right knee.  She was admitted to the hospital with an elevated INR related to Coumadin use and over 4. She was admitted by one of my partners.  She was admitted in the hospital for observation.  Ultrasound confirmed as expected radiographically with patella alta, patella tendon injury.  By Wednesday, March 06, 2014, her INR normalized to 1.2.  We discussed the risks and benefits and necessity of surgical intervention.  Consent was obtained with the risk of infection and risk of recurrent disruption  of extensor lagging.  Consent was obtained for above.  PROCEDURE IN DETAIL:  The patient was brought to operative theater. Once adequate anesthesia, preoperative antibiotics, Ancef 2 g administered, she was positioned supine.  The right thigh tourniquet was placed.  The right lower extremity was then prepped and draped in sterile fashion.  A time-out was performed identifying the patient, planned procedure, and extremity.  The leg was exsanguinated.  Tourniquet elevated to 250 mmHg.  The patient's old incision was utilized, demarcated, an incision was made following soft tissue plane creation, noted significant hemarthrosis as well as soft tissue swelling about the bruising and swelling in her leg. This was evacuated and irrigated using pulse lavage and suction.  It was readily evident that the patella tendon had avulsed off the tubercle.  Based on this and previous experience with this type of injury and total knee arthroplasty.  I created a medial and lateral retinacular releases longitudinally to immobilize the patella.  I then passed #2 FiberWire sutures in a Krackow pattern suture with the 4 ends coming at the distal end of the suture.  I was thus able to restore the patella to its normal orientation.  I was able to retract the patella tendon back into its correct orientation on the  tibial tubercle.  I removed some of the cortical bone on the tibial tubercle.  I then placed a DePuy suture anchor into the tubercle with the 2 suture ends that were passed through the tendon with the tendon in its correct orientation.  In an effort to maintain tibial tendon orientation, I placed 2 K-wires into the tendon through the bone, this stabilized while the suture was tightened down.  I then placed a 4.0 cannulated cancellous screw into the tibia from an anterior medial to posterior direction to get a bicortical bite.  I then, with the sutures that were passed through the tendon with  tension applied, sutured these down around this as opposed to further provide support to the patella tendon to allow for healing.  Following irrigation with pulse lavage, again at this point, I then reapproximated the medial and lateral retinacular splits with #1 Vicryl to reapproximate the extensor mechanism and capsular tissues.  There was noted to be significant disruption along the medial aspect of knee with avulsion of soft tissues medially.  There was no repairable tissue here.  At this point, I reapproximated the subcutaneous layer using 2-0 Vicryl. The remaining wound was closed with 2-0 Vicryl.  The skin was cleaned, dried, and dressed sterilely using Xeroform and a bulky sterile wrap. The patient was then brought to the recovery room in stable condition, tolerating the procedure well.  I will plan her to be weightbearing as tolerated with a knee immobilizer, transition into a TROM brace locked in extension that fits her better.  She will need to be immobilized for up to 6 weeks to prevent complications.  Note, [name] has had a significant issue with knee flexion all along and most likely the reason for avulsion at this point, based on the limited injury and the lack of tolerance with her motion.     Pietro Cassis Alvan Dame, M.D.     MDO/MEDQ  D:  03/06/2014  T:  03/06/2014  Job:  [id number]

## 2014-03-07 NOTE — Progress Notes (Signed)
ANTICOAGULATION CONSULT NOTE - Initial Consult  Pharmacy Consult for Warfarin Indication: atrial fibrillation and VTE prophylaxis  Allergies  Allergen Reactions  . Codeine Nausea And Vomiting  . Hydrocodone Other (See Comments)    hallucination  . Morphine And Related Nausea And Vomiting  . Requip [Ropinirole Hcl]     Headache   . Zinc     nausea    Patient Measurements: Height: 5' 0.5" (153.7 cm) Weight: 134 lb (60.782 kg) IBW/kg (Calculated) : 46.65  Vital Signs: Temp: 97.8 F (36.6 C) (07/02 0510) Temp src: Oral (07/02 0510) BP: 144/82 mmHg (07/02 0510) Pulse Rate: 65 (07/02 0510)  Labs:  Recent Labs  03/05/14 0809 03/06/14 0400 03/07/14 0500  LABPROT 15.4* 15.6* 14.8  INR 1.22 1.24 1.16    Estimated Creatinine Clearance: 28.1 ml/min (by C-G formula based on Cr of 1.36).   Medical History: Past Medical History  Diagnosis Date  . PAF (paroxysmal atrial fibrillation)     controlled with amiodarone, on coumadin  . CAD (coronary artery disease)   . Aortic root dilatation   . Moderate aortic stenosis   . Chronic anticoagulation   . Tachycardia-bradycardia syndrome     s/p PPM by Dr Doreatha Lew (MDT) 07/03/10  . GERD (gastroesophageal reflux disease)   . Chronic kidney disease   . Pacemaker     2012  . History of nephrectomy     left - due to kidney cancer  . Neuropathy     feet/legs  . Restless leg syndrome   . Headache(784.0)     hx migraines - takes Zoloft for migraines  . History of uterine cancer   . Cancer     hx Kidney cancer / hx of endometrial cancer  . PONV (postoperative nausea and vomiting)   . Anxiety   . Heart murmur   . HTN (hypertension)   . Hyperlipemia   . Hypothyroidism   . 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 Osteomyelitis as child  . Family history of anesthesia complication     daughter has ponv      Assessment: 50 yoF on chronic warfarin therapy PTA for atrial fibrillation admitted for right patella tendon rupture.  Warfarin  was held for surgery (repair right patellar tendon avulsion, 7/1).  Pharmacy consulted to resume warfarin post-op.  Also of note, pt has a remote history of chronic osteomyelitis of right femur s/p multiple I&Ds, s/p R TKA in 02/2012, developed late infection in right knee 11/2013, underwent poly-exchange 12/2013 and treatment of IV antibiotics with transition to long term oral antibiotics (patient currently taking minocycline, switched from doxycycline in early June due to nausea).   INR 1.16  PTA warfarin dose 2 mg daily except 1 mg on MWF per anti-coag clinic notes  CBC WNL on admission, no CBC post-op  Drug interactions: amiodarone (takes chronically with warfarin PTA), minocycline (also taking chronically PTA)  Patient wearing SCDs  Goal of Therapy:  INR 2-3 Monitor platelets by anticoagulation protocol: Yes   Plan:  1.  Warfarin 2.5 mg this AM. 2.  Daily INR. 3.  Check AM CBC.  Hershal Coria 03/07/2014,7:49 AM

## 2014-03-07 NOTE — Evaluation (Signed)
Physical Therapy Evaluation Patient Details Name: [name] MRN: [id number] DOB: 02-29-1934 Today's Date: 03/07/2014   History of Present Illness  s/p right quad tendon repair  Clinical Impression  Pt will benefit from PT to address deficits below; plan is for home with family support, HHPT   Follow Up Recommendations Home health PT    Equipment Recommendations  None recommended by PT    Recommendations for Other Services       Precautions / Restrictions Precautions Precautions: Other (comment) Precaution Comments: NO ROM RIGHT KNEE Required Braces or Orthoses: Knee Immobilizer - Right Knee Immobilizer - Right: On at all times Restrictions RLE Weight Bearing: Weight bearing as tolerated      Mobility  Bed Mobility Overal bed mobility: Needs Assistance Bed Mobility: Supine to Sit     Supine to sit: Min assist     General bed mobility comments: cues for technique  Transfers Overall transfer level: Needs assistance Equipment used: Rolling walker (2 wheeled) Transfers: Sit to/from Stand Sit to Stand: Min assist         General transfer comment: cues for hand and RLE position  Ambulation/Gait Ambulation/Gait assistance: Min assist Ambulation Distance (Feet): 18 Feet Assistive device: Rolling walker (2 wheeled) Gait Pattern/deviations: Step-to pattern;Antalgic Gait velocity: decr   General Gait Details: verbal cues for sequence; incr time and fatigues easily today, chair follow for safety  Stairs            Wheelchair Mobility    Modified Rankin (Stroke Patients Only)       Balance                                             Pertinent Vitals/Pain Denies pain    Home Living Family/patient expects to be discharged to:: Private residence Living Arrangements: Alone Available Help at Discharge: Family;Available 24 hours/day Type of Home: House Home Access: Stairs to enter Entrance Stairs-Rails: Left Entrance  Stairs-Number of Steps: 2; can drive to basement entry no steps   Home Equipment: Wheelchair - manual;Cane - single point;Crutches;Cane - quad;Bedside commode;Shower seat;Walker - 2 wheels;Grab bars - tub/shower;Hand held shower head;Grab bars - toilet;Adaptive equipment      Prior Function Level of Independence: Independent with assistive device(s)         Comments: with walker     Hand Dominance        Extremity/Trunk Assessment   Upper Extremity Assessment: Defer to OT evaluation           Lower Extremity Assessment: RLE deficits/detail         Communication   Communication: No difficulties  Cognition Arousal/Alertness: Awake/alert Behavior During Therapy: WFL for tasks assessed/performed Overall Cognitive Status: Within Functional Limits for tasks assessed                      General Comments      Exercises General Exercises - Lower Extremity Ankle Circles/Pumps: AROM;Both;10 reps      Assessment/Plan    PT Assessment Patient needs continued PT services  PT Diagnosis Difficulty walking   PT Problem List Decreased strength;Decreased activity tolerance;Decreased balance;Decreased mobility;Decreased knowledge of use of DME  PT Treatment Interventions DME instruction;Gait training;Functional mobility training;Therapeutic activities;Therapeutic exercise;Patient/family education   PT Goals (Current goals can be found in the Care Plan section) Acute Rehab PT Goals Patient Stated Goal: home and stay  downstairs PT Goal Formulation: With patient Time For Goal Achievement: 03/14/14 Potential to Achieve Goals: Good    Frequency Min 6X/week   Barriers to discharge        Co-evaluation               End of Session Equipment Utilized During Treatment: Gait belt Activity Tolerance: Patient limited by fatigue Patient left: in chair;with call bell/phone within reach;with family/visitor present Nurse Communication: Mobility status          Time: 1205-1229 PT Time Calculation (min): 24 min   Charges:   PT Evaluation $Initial PT Evaluation Tier I: 1 Procedure PT Treatments $Gait Training: 8-22 mins $Therapeutic Activity: 8-22 mins   PT G Codes:          Phallon Haydu 03/12/2014, 1:04 PM

## 2014-03-08 LAB — CBC
HCT: 29.3 % — ABNORMAL LOW (ref 36.0–46.0)
HEMOGLOBIN: 9.7 g/dL — AB (ref 12.0–15.0)
MCH: 31.4 pg (ref 26.0–34.0)
MCHC: 33.1 g/dL (ref 30.0–36.0)
MCV: 94.8 fL (ref 78.0–100.0)
Platelets: 157 10*3/uL (ref 150–400)
RBC: 3.09 MIL/uL — ABNORMAL LOW (ref 3.87–5.11)
RDW: 14.4 % (ref 11.5–15.5)
WBC: 11.5 10*3/uL — AB (ref 4.0–10.5)

## 2014-03-08 LAB — PROTIME-INR
INR: 1.13 (ref 0.00–1.49)
Prothrombin Time: 14.5 seconds (ref 11.6–15.2)

## 2014-03-08 MED ORDER — METHOCARBAMOL 500 MG PO TABS: 500.0000 mg | ORAL_TABLET | Freq: Four times a day (QID) | ORAL | Status: DC | PRN

## 2014-03-08 MED ORDER — WARFARIN SODIUM 2 MG PO TABS
2.0000 mg | ORAL_TABLET | Freq: Once | ORAL | Status: DC
  Filled 2014-03-08: qty 1

## 2014-03-08 MED ORDER — TRAMADOL HCL 50 MG PO TABS: 50.0000 mg | ORAL_TABLET | Freq: Four times a day (QID) | ORAL | Status: DC | PRN

## 2014-03-08 MED ORDER — WARFARIN SODIUM 2.5 MG PO TABS
2.5000 mg | ORAL_TABLET | Freq: Once | ORAL | Status: AC
  Administered 2014-03-08: 2.5 mg via ORAL
  Filled 2014-03-08: qty 1

## 2014-03-08 MED ORDER — DSS 100 MG PO CAPS: 100.0000 mg | ORAL_CAPSULE | Freq: Two times a day (BID) | ORAL | Status: DC

## 2014-03-08 NOTE — Progress Notes (Signed)
Physical Therapy Treatment Patient Details Name: [name] MRN: [id number] DOB: 26-Jun-1934 Today's Date: 03/08/2014    History of Present Illness s/p right quad tendon repair    PT Comments    Pt dressed and eager to D/C to home today.  Assisted OOB to amb limited distance in hallway then practiced going up/down 2 steps.  Follow Up Recommendations  Home health PT     Equipment Recommendations  None recommended by PT    Recommendations for Other Services       Precautions / Restrictions Precautions Precaution Comments: NO ROM RIGHT KNEE Required Braces or Orthoses: Knee Immobilizer - Right Knee Immobilizer - Right: On at all times Restrictions Weight Bearing Restrictions: Yes RLE Weight Bearing: Weight bearing as tolerated    Mobility  Bed Mobility Overal bed mobility: Modified Independent             General bed mobility comments: increased time  Transfers Overall transfer level: Modified independent Equipment used: Rolling walker (2 wheeled) Transfers: Sit to/from Stand Sit to Stand: Modified independent (Device/Increase time)         General transfer comment: increased time  Ambulation/Gait Ambulation/Gait assistance: Min guard Ambulation Distance (Feet): 28 Feet Assistive device: Rolling walker (2 wheeled) Gait Pattern/deviations: Step-to pattern Gait velocity: decr   General Gait Details: limited amb distance as pt fatigues quickly   Stairs Stairs: Yes Stairs assistance: Min guard Stair Management: Two rails;Step to pattern;Forwards Number of Stairs: 2 General stair comments: 25% VC's on proper sequencing and safety  Wheelchair Mobility    Modified Rankin (Stroke Patients Only)       Balance                                    Cognition                            Exercises      General Comments        Pertinent Vitals/Pain No c/o pain    Home Living                      Prior  Function            PT Goals (current goals can now be found in the care plan section) Progress towards PT goals: Progressing toward goals    Frequency  Min 6X/week    PT Plan      Co-evaluation             End of Session           Time: 8469-6295 PT Time Calculation (min): 11 min  Charges:  $Gait Training: 8-22 mins                    G Codes:      Rica Koyanagi  PTA [facility]  Acute  Rehab Pager      [phone]

## 2014-03-08 NOTE — Progress Notes (Signed)
   Subjective: 2 Days Post-Op Procedure(s) (LRB): AVULSION WITH PATELLA TENDON REPAIR (Right)   Patient reports pain as mild, pain controlled. No events throughout the night. Feels that she has done well with PT. Ready to be discharged home.  Objective:   VITALS:   Filed Vitals:   03/08/14 0546  BP: 154/69  Pulse: 65  Temp: 97.5 F (36.4 C)  Resp: 16    Dorsiflexion/Plantar flexion intact Incision: dressing C/D/I No cellulitis present Compartment soft  LABS  Recent Labs  03/08/14 0440  HGB 9.7*  HCT 29.3*  WBC 11.5*  PLT 157       Assessment/Plan: 2 Days Post-Op Procedure(s) (LRB): AVULSION WITH PATELLA TENDON REPAIR (Right) KI on at all times. No flexion of the knee Dressing changed Up with therapy Discharge home with home health Follow up in 2 weeks at [facility]. Follow up with OLIN,Maritza Hosterman D in 2 weeks.  Contact information:  [facility] [address] [place] Aaiden Depoy   PAC  03/08/2014, 9:15 AM

## 2014-03-08 NOTE — Progress Notes (Addendum)
[place] for Warfarin Indication: atrial fibrillation  Allergies  Allergen Reactions  . Codeine Nausea And Vomiting  . Hydrocodone Other (See Comments)    hallucination  . Morphine And Related Nausea And Vomiting  . Requip [Ropinirole Hcl]     Headache   . Zinc     nausea    Patient Measurements: Height: 5' 0.5" (153.7 cm) Weight: 134 lb (60.782 kg) IBW/kg (Calculated) : 46.65  Vital Signs: Temp: 97.5 F (36.4 C) (07/03 0546) Temp src: Oral (07/03 0546) BP: 154/69 mmHg (07/03 0546) Pulse Rate: 65 (07/03 0546)  Labs:  Recent Labs  03/06/14 0400 03/07/14 0500 03/08/14 0440  HGB  --   --  9.7*  HCT  --   --  29.3*  PLT  --   --  157  LABPROT 15.6* 14.8 14.5  INR 1.24 1.16 1.13    Estimated Creatinine Clearance: 28.1 ml/min (by C-G formula based on Cr of 1.36).   Medical History: Past Medical History  Diagnosis Date  . PAF (paroxysmal atrial fibrillation)     controlled with amiodarone, on coumadin  . CAD (coronary artery disease)   . Aortic root dilatation   . Moderate aortic stenosis   . Chronic anticoagulation   . Tachycardia-bradycardia syndrome     s/p PPM by Dr Doreatha Lew (MDT) 07/03/10  . GERD (gastroesophageal reflux disease)   . Chronic kidney disease   . Pacemaker     2012  . History of nephrectomy     left - due to kidney cancer  . Neuropathy     feet/legs  . Restless leg syndrome   . Headache(784.0)     hx migraines - takes Zoloft for migraines  . History of uterine cancer   . Cancer     hx Kidney cancer / hx of endometrial cancer  . PONV (postoperative nausea and vomiting)   . Anxiety   . Heart murmur   . HTN (hypertension)   . Hyperlipemia   . Hypothyroidism   . 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 Osteomyelitis as child  . Family history of anesthesia complication     daughter has ponv      Assessment: 89 yoF on chronic warfarin therapy PTA for atrial fibrillation admitted for right patella  tendon rupture.  Warfarin was held for surgery (repair right patellar tendon avulsion, 7/1).  Pharmacy consulted to resume warfarin post-op.  Also of note, pt has a remote history of chronic osteomyelitis of right femur s/p multiple I&Ds, s/p R TKA in 02/2012, developed late infection in right knee 11/2013, underwent poly-exchange 12/2013 and treatment of IV antibiotics with transition to long term oral antibiotics (patient currently taking minocycline, switched from doxycycline in early June due to nausea).   Admission INR was supratherapeutic (4.6 on 6/27). PTA warfarin dose 2 mg daily except 1 mg on MWF per anti-coag clinic notes Vitamin K 5 mg PO was administered on 6/28.  Inpatient warfarin doses administered:  7/2 - 2.5 mg.  7/3:  No INR response yet (as expected) after reinitiation of warfarin yesterday  Hgb low postop. Pltc WNL  Drug interactions: amiodarone (takes chronically with warfarin PTA), minocycline (also taking chronically PTA)   Goal of Therapy:  INR 2-3    Plan:  1.  Warfarin 2.5mg  PO this AM 2.  Daily INR.   Clayburn Pert, PharmD, BCPS Pager: [phone] 03/08/2014  7:58 AM

## 2014-03-09 ENCOUNTER — Telehealth: Payer: Self-pay | Admitting: Physician Assistant

## 2014-03-09 NOTE — Discharge Summary (Signed)
Physician Discharge Summary  Patient ID: [name] MRN: [id number] DOB/AGE: 1934-03-30 80 y.o.  Admit date: 03/02/2014 Discharge date: 03/08/2014   Procedures:  Procedure(s) (LRB): AVULSION WITH PATELLA TENDON REPAIR (Right)  Attending Physician:  Dr. Paralee Cancel   Admission Diagnoses:   Right leg pain  Discharge Diagnoses:  Active Problems:   Acute knee pain  Past Medical History  Diagnosis Date  . PAF (paroxysmal atrial fibrillation)     controlled with amiodarone, on coumadin  . CAD (coronary artery disease)   . Aortic root dilatation   . Moderate aortic stenosis   . Chronic anticoagulation   . Tachycardia-bradycardia syndrome     s/p PPM by Dr Doreatha Lew (MDT) 07/03/10  . GERD (gastroesophageal reflux disease)   . Chronic kidney disease   . Pacemaker     2012  . History of nephrectomy     left - due to kidney cancer  . Neuropathy     feet/legs  . Restless leg syndrome   . Headache(784.0)     hx migraines - takes Zoloft for migraines  . History of uterine cancer   . Cancer     hx Kidney cancer / hx of endometrial cancer  . PONV (postoperative nausea and vomiting)   . Anxiety   . Heart murmur   . HTN (hypertension)   . Hyperlipemia   . Hypothyroidism   . 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 Osteomyelitis as child  . Family history of anesthesia complication     daughter has ponv    HPI: The patient is a 80 y.o. female presents to ED this evening after losing balance and hyperflexing her right knee with subsequent progressive pain and extreme swelling. She has a remote history of chronic osteomyelitis of the right femur s/p multiple I and D's with presumptive cure, s/p R TKA by Dr. Alvan Dame 02/2012 and initially did well although had very limited motion, by her report only 50 degrees flexion. Developed late infection in right knee in march of 2015, underwent I and D and poly exchange 4/15, IV then po abx, now on oral agent of which she does not recall the name.  PCP:  [name], MD   Discharged Condition: good  Hospital Course:   Patient was admitted to the hospital on 03/02/2014.  She had an uneventful course int he hospital until she underwent the above stated procedure on 03/06/2014. Patient tolerated the procedure well and brought to the recovery room in good condition and subsequently to the floor.  POD #1 BP: 117/73 ; Pulse: 66 ; Temp: 98.6 F (37 C) ; Resp: 18 Patient reports pain as mild. No events throughout the night.  Dorsiflexion/plantar flexion intact, incision: dressing C/D/I, no cellulitis present and compartment soft.   POD #2  BP: 154/69 ; Pulse: 65 ; Temp: 97.5 F (36.4 C) ; Resp: 16 Patient reports pain as mild, pain controlled. No events throughout the night. Feels that she has done well with PT. Ready to be discharged home. Dorsiflexion/plantar flexion intact, incision: dressing C/D/I, no cellulitis present and compartment soft.   LABS  Basename    HGB  9.7  HCT  29.3    Discharge Exam: General appearance: alert, cooperative and no distress Extremities: Homans sign is negative, no sign of DVT, no edema, redness or tenderness in the calves or thighs and no ulcers, gangrene or trophic changes  Disposition: Home with follow up in 2 weeks   Follow-up Information   Follow up with  Mauri Pole, MD. Schedule an appointment as soon as possible for a visit in 2 weeks.   Specialty:  Orthopedic Surgery   Contact information:   [address] [place] [address] [phone]       Discharge Instructions   Call MD / Call 911    Complete by:  As directed   If you experience chest pain or shortness of breath, CALL 911 and be transported to the hospital emergency room.  If you develope a fever above 101 F, pus (white drainage) or increased drainage or redness at the wound, or calf pain, call your surgeon's office.     Constipation Prevention    Complete by:  As directed   Drink plenty of fluids.   Prune juice may be helpful.  You may use a stool softener, such as Colace (over the counter) 100 mg twice a day.  Use MiraLax (over the counter) for constipation as needed.     Diet - low sodium heart healthy    Complete by:  As directed      Discharge instructions    Complete by:  As directed   Change dressings daily with guaze and tape. Follow up in 2 weeks at [facility]. Call with any questions or concerns.     Weight bearing as tolerated    Complete by:  As directed   Knee immobilizer on at all times.  Laterality:  right  Extremity:  Lower             Medication List         acetaminophen 500 MG tablet  Commonly known as:  TYLENOL  Take 1,000 mg by mouth every 6 (six) hours as needed for moderate pain.     albuterol 108 (90 BASE) MCG/ACT inhaler  Commonly known as:  PROVENTIL HFA;VENTOLIN HFA  Inhale 2 puffs into the lungs every 6 (six) hours as needed for wheezing or shortness of breath.     amiodarone 200 MG tablet  Commonly known as:  PACERONE  Take 200 mg by mouth every morning.     atenolol 100 MG tablet  Commonly known as:  TENORMIN  Take 50 mg by mouth 2 (two) times daily.     atorvastatin 40 MG tablet  Commonly known as:  LIPITOR  Take 20 mg by mouth See admin instructions. Pt takes Monday-friday     Biotin 1000 MCG tablet  Take 10,000 mcg by mouth every morning.     DSS 100 MG Caps  Take 100 mg by mouth 2 (two) times daily.     FLORA-Q Caps capsule  Take 1 capsule by mouth 2 (two) times daily.     levothyroxine 100 MCG tablet  Commonly known as:  SYNTHROID, LEVOTHROID  Take 100 mcg by mouth daily before breakfast.     losartan-hydrochlorothiazide 100-25 MG per tablet  Commonly known as:  HYZAAR  Take 0.5 tablets by mouth every morning.     Magnesium 250 MG Tabs  Take 250 mg by mouth daily.     meclizine 25 MG tablet  Commonly known as:  ANTIVERT  Take 1 tablet (25 mg total) by mouth 3 (three) times daily as needed for dizziness  or nausea.     methocarbamol 500 MG tablet  Commonly known as:  ROBAXIN  Take 1 tablet (500 mg total) by mouth every 6 (six) hours as needed for muscle spasms.     minocycline 100 MG capsule  Commonly known as:  MINOCIN,DYNACIN  Take 100 mg by mouth 2 (two) times daily. Long-term antibiotic per Dr. Aurea Graff office for knee infection     nitroGLYCERIN 0.4 MG SL tablet  Commonly known as:  NITROSTAT  Place 0.4 mg under the tongue every 5 (five) minutes as needed for chest pain.     nitroGLYCERIN 0.4 mg/hr patch  Commonly known as:  NITRODUR - Dosed in mg/24 hr  Place 0.4 mg onto the skin daily as needed (chest pain).     PEPCID PO  Take 150 mg by mouth daily.     potassium chloride SA 20 MEQ tablet  Commonly known as:  K-DUR,KLOR-CON  Take 20 mEq by mouth 3 (three) times daily.     predniSONE 20 MG tablet  Commonly known as:  DELTASONE  1 tab 3 x day for 3 days, then 1 tab 2 x day for 3 days, then 1 tab 1 x day for 5 days     pregabalin 75 MG capsule  Commonly known as:  LYRICA  Take 75 mg by mouth 2 (two) times daily.     ranitidine 150 MG tablet  Commonly known as:  ZANTAC  Take 150 mg by mouth daily.     sertraline 50 MG tablet  Commonly known as:  ZOLOFT  Take 50 mg by mouth every evening.     traMADol 50 MG tablet  Commonly known as:  ULTRAM  Take 1 tablet (50 mg total) by mouth every 6 (six) hours as needed for moderate pain or severe pain.     Vitamin D3 2000 UNITS Tabs  Take 2,000 Units by mouth 2 (two) times daily.     warfarin 2 MG tablet  Commonly known as:  COUMADIN  Take 1-2 mg by mouth daily. 2 mg daily except 1 mg on MWF per anti-coag clinic         Signed: [place] Pugh. Dailynn Nancarrow   PA-C  03/09/2014, 12:36 AM

## 2014-03-09 NOTE — Telephone Encounter (Signed)
Called by [facility] re: INR. Pt had knee surgery this past week, came home yesterday.  No bleeding issues or other problems.  INR is < 1.5 Pt is previous dose coumadin at 2 mg qd, except 1 mg on M-W-F  Advised to give pt 2 mg daily and recheck on Weds, 07/08

## 2014-03-13 ENCOUNTER — Ambulatory Visit (INDEPENDENT_AMBULATORY_CARE_PROVIDER_SITE_OTHER): Payer: Medicare Other | Admitting: Cardiology

## 2014-03-13 LAB — POCT INR: INR: 2.2

## 2014-03-18 ENCOUNTER — Ambulatory Visit (INDEPENDENT_AMBULATORY_CARE_PROVIDER_SITE_OTHER): Payer: Medicare Other | Admitting: Pharmacist

## 2014-03-18 LAB — POCT INR: INR: 4

## 2014-03-22 ENCOUNTER — Ambulatory Visit (INDEPENDENT_AMBULATORY_CARE_PROVIDER_SITE_OTHER): Payer: Medicare Other | Admitting: Internal Medicine

## 2014-03-22 ENCOUNTER — Other Ambulatory Visit: Payer: Self-pay | Admitting: Internal Medicine

## 2014-03-22 LAB — POCT INR: INR: 3.1

## 2014-03-28 ENCOUNTER — Other Ambulatory Visit: Payer: Self-pay | Admitting: Internal Medicine

## 2014-03-29 ENCOUNTER — Telehealth: Payer: Self-pay | Admitting: Physician Assistant

## 2014-03-29 LAB — POCT INR: INR: 2.2

## 2014-03-29 NOTE — Telephone Encounter (Signed)
      [facility] called report her INR as being 2.2. I will forward to [employer].   Lorretta Harp PA-C

## 2014-03-29 NOTE — Telephone Encounter (Signed)
THIS WAS ENTERED IN ERROR AND IS THE WRONG PATIENT!!!!!! PLEASE DISREGARD.

## 2014-03-29 NOTE — Telephone Encounter (Signed)
     Patient called to ask if her heart monitor is on. She says all the lights are one but there is a blue question mark on the part that goes over her chest.

## 2014-04-01 ENCOUNTER — Ambulatory Visit (INDEPENDENT_AMBULATORY_CARE_PROVIDER_SITE_OTHER): Payer: Medicare Other | Admitting: Cardiology

## 2014-04-01 ENCOUNTER — Telehealth: Payer: Self-pay | Admitting: Cardiology

## 2014-04-01 DIAGNOSIS — I4891 Unspecified atrial fibrillation: Secondary | ICD-10-CM

## 2014-04-01 NOTE — Telephone Encounter (Signed)
Results addressed see anticoagulation note in Epic.

## 2014-04-01 NOTE — Telephone Encounter (Signed)
New message     For [name] [facility] was there on Friday to draw her PT/INR.  It was 2.2.  Please advise

## 2014-04-02 ENCOUNTER — Ambulatory Visit: Payer: Self-pay | Admitting: Internal Medicine

## 2014-04-09 ENCOUNTER — Ambulatory Visit (INDEPENDENT_AMBULATORY_CARE_PROVIDER_SITE_OTHER): Payer: Medicare Other | Admitting: Pharmacist

## 2014-04-09 DIAGNOSIS — I4891 Unspecified atrial fibrillation: Secondary | ICD-10-CM

## 2014-04-09 LAB — POCT INR: INR: 2

## 2014-04-16 ENCOUNTER — Ambulatory Visit (INDEPENDENT_AMBULATORY_CARE_PROVIDER_SITE_OTHER): Payer: Medicare Other | Admitting: Pharmacist

## 2014-04-16 LAB — POCT INR: INR: 2

## 2014-04-18 ENCOUNTER — Other Ambulatory Visit: Payer: Self-pay | Admitting: Internal Medicine

## 2014-04-22 ENCOUNTER — Telehealth: Payer: Self-pay | Admitting: *Deleted

## 2014-04-22 NOTE — Telephone Encounter (Signed)
Daughter called and states Dr Suszanne Conners took patient off Furosemide.  Patient's BP high, in the 170 range, and retaining fluid.  Per Dr Melford Aase, [place] to restart furosemide at 1/2 tab daily.  Daughter aware.

## 2014-04-24 ENCOUNTER — Telehealth: Payer: Self-pay | Admitting: *Deleted

## 2014-04-24 ENCOUNTER — Other Ambulatory Visit: Payer: Self-pay | Admitting: Internal Medicine

## 2014-04-24 MED ORDER — MINOXIDIL 10 MG PO TABS: ORAL_TABLET | ORAL | Status: DC

## 2014-04-24 NOTE — Telephone Encounter (Signed)
Daughter called and states patient's BP elevated to 166/94 and higher with only walking about 20 steps to bathroom.  Per Dr Melford Aase, continue Furosemide 40 mg 1/2 tab daily and new RX for MInoxidil 10 mg sent to pharmacy.  Daughter advised to start the Minoxidil 10 mg at 1/2 tab daily.

## 2014-04-25 ENCOUNTER — Telehealth: Payer: Self-pay | Admitting: *Deleted

## 2014-04-25 ENCOUNTER — Other Ambulatory Visit: Payer: Self-pay | Admitting: Internal Medicine

## 2014-04-25 ENCOUNTER — Telehealth: Payer: Self-pay | Admitting: Cardiology

## 2014-04-25 NOTE — Telephone Encounter (Signed)
Daughter called and states patient's BP 102/42 this AM and 98/41 last PM.  Per Dr Eddie North, cut Minoxidil 10 mg into 1/4 tab and if BP less than 100/, cut out other BP meds or cut them in half.

## 2014-04-25 NOTE — Telephone Encounter (Signed)
Spoke with Griffith Citron), per Dr. Rayann Heman she should continue to let Dr. Melford Aase titrate her medications as he sees necessary.  If she has any more episodes of chest pressure that requires a second NTG she should be transported to [facility] ER via EMS.  Keep the scheduled appointment with Dr. Mare Ferrari 05/06/14.  Daughter is in agreement.

## 2014-04-25 NOTE — Telephone Encounter (Signed)
New message   Daughter calling    C/O blood pressure  & heart rate issues.    Yesterday  160/85, 88                   165/91 , 89

## 2014-04-25 NOTE — Telephone Encounter (Signed)
I spoke with the pt's daughter and the pt's BP and pulse have been "all over the place" since Sunday.  The pt just came inside and her BP is 98/54, pulse 95.  The pt has been having issues with swelling and Dr Melford Aase resumed Furosemide 20mg  daily on Tuesday. Weight Tues 160, Wed 155, and 152 today. On Wednesday Brenda contacted Dr Melford Aase again in regards to elevated BP and he start Minoxidil 10mg  one-half tablet daily. She spoke with his office again today due to low BP and he advised that they decrease Minoxidil to 2.5mg  daily.  I advised that since Dr Melford Aase has made multiple medication adjustments this week that the pt needs to be evaluated in his office for BP issues.  I also instructed [name] to do a pacemaker transmission so that we can review the pt's heart rate. She agreed with plan.  I will route this message to the device nurses to follow-up on heart rate issues.

## 2014-04-30 ENCOUNTER — Ambulatory Visit (INDEPENDENT_AMBULATORY_CARE_PROVIDER_SITE_OTHER): Payer: Medicare Other

## 2014-04-30 LAB — POCT INR: INR: 1.6

## 2014-05-06 ENCOUNTER — Ambulatory Visit (INDEPENDENT_AMBULATORY_CARE_PROVIDER_SITE_OTHER): Payer: Medicare Other | Admitting: Cardiology

## 2014-05-06 ENCOUNTER — Encounter: Payer: Self-pay | Admitting: Cardiology

## 2014-05-06 ENCOUNTER — Telehealth: Payer: Self-pay | Admitting: Cardiology

## 2014-05-06 VITALS — BP 140/78 | HR 79 | Ht 60.0 in | Wt 148.0 lb

## 2014-05-06 DIAGNOSIS — I480 Paroxysmal atrial fibrillation: Secondary | ICD-10-CM

## 2014-05-06 DIAGNOSIS — R0609 Other forms of dyspnea: Principal | ICD-10-CM

## 2014-05-06 DIAGNOSIS — I119 Hypertensive heart disease without heart failure: Secondary | ICD-10-CM

## 2014-05-06 DIAGNOSIS — I10 Essential (primary) hypertension: Secondary | ICD-10-CM

## 2014-05-06 DIAGNOSIS — I359 Nonrheumatic aortic valve disorder, unspecified: Secondary | ICD-10-CM

## 2014-05-06 DIAGNOSIS — I4891 Unspecified atrial fibrillation: Secondary | ICD-10-CM

## 2014-05-06 DIAGNOSIS — I350 Nonrheumatic aortic (valve) stenosis: Secondary | ICD-10-CM

## 2014-05-06 DIAGNOSIS — R5383 Other fatigue: Secondary | ICD-10-CM

## 2014-05-06 DIAGNOSIS — I495 Sick sinus syndrome: Secondary | ICD-10-CM

## 2014-05-06 DIAGNOSIS — R0789 Other chest pain: Secondary | ICD-10-CM

## 2014-05-06 DIAGNOSIS — R5381 Other malaise: Secondary | ICD-10-CM

## 2014-05-06 NOTE — Telephone Encounter (Signed)
Daughter wanted you to have alternate # [phone]. Please call back.

## 2014-05-06 NOTE — Assessment & Plan Note (Signed)
The patient has not had any symptoms referable to her mild to moderate aortic stenosis.

## 2014-05-06 NOTE — Assessment & Plan Note (Signed)
The patient brought in a list of her blood pressures.  Recently her blood pressures have been stable on a combination of beta blocker and low-dose Lasix.

## 2014-05-06 NOTE — Telephone Encounter (Signed)
New message     When pt got back home---walking from car to house--approx 57ft.  She felt like she was going to faint--at that time her bp was 91/54 and pulse was 95.

## 2014-05-06 NOTE — Progress Notes (Signed)
[name] Date of Birth:  Apr 02, 1934 [place] [address] [place], [place]  [address] [phone]        Fax   [phone]   History of Present Illness: This pleasant 80 year old woman is seen for a scheduled followup office visit. She has a past history of tachybradycardia syndrome . She has a permanent pacemaker implanted 07/01/10 . She has a remote history of repair of a patent ductus arteriosus. She has a history of moderate valvular aortic stenosis. She has had a history of congestive heart failure. Most recent admission was 08/25/12-08/29/12 for exacerbation of CHF. Her last echocardiogram 08/26/12 showed ejection fraction of 60-65% with grade 2 diastolic dysfunction and a peak aortic valve gradient of 29 with a mean aortic valve gradient of 14. Since we last saw her she has had 2 surgical procedures on her right knee and Dr. colon.  Her most recent surgery was 03/05/2014.  She is having to keep her leg in a rigid brace until the end of September at which point she hopes to be able to start physical therapy.  For health reasons she had to retire on June 9     Current Outpatient Prescriptions  Medication Sig Dispense Refill  . acetaminophen (TYLENOL) 500 MG tablet Take 1,000 mg by mouth every 6 (six) hours as needed for moderate pain.      [name] amiodarone (PACERONE) 200 MG tablet TAKE 1 TAB DAILY FOR ATRIAL FIB.  90 tablet  1  . atenolol (TENORMIN) 100 MG tablet Take 50 mg by mouth 2 (two) times daily.       [name] atorvastatin (LIPITOR) 80 MG tablet TAKE 1/2-1 TABLET EVERY DAY AS DIRECTED FOR CHOLESTEROL  90 tablet  0  . Biotin 5000 MCG CAPS Take 5,000 mcg by mouth 2 (two) times daily.      . Cholecalciferol (VITAMIN D3) 2000 UNITS TABS Take 2,000 Units by mouth 2 (two) times daily.       . furosemide (LASIX) 40 MG tablet Take 40 mg by mouth as directed. 1/2 tablet daily      . KLOR-CON M20 20 MEQ tablet TAKE 1 TAB 3 TIMES A DAY  270 tablet  2  .  levothyroxine (SYNTHROID, LEVOTHROID) 100 MCG tablet Take 100 mcg by mouth daily before breakfast.      . losartan-hydrochlorothiazide (HYZAAR) 100-25 MG per tablet Take 0.5 tablets by mouth every morning.      . Magnesium 250 MG TABS Take 250 mg by mouth daily.       . meclizine (ANTIVERT) 25 MG tablet Take 25 mg by mouth as needed for dizziness or nausea.      . methocarbamol (ROBAXIN) 500 MG tablet Take 1 tablet (500 mg total) by mouth every 6 (six) hours as needed for muscle spasms.  50 tablet  0  . minocycline (MINOCIN,DYNACIN) 100 MG capsule Take 100 mg by mouth 2 (two) times daily. Long-term antibiotic per Dr. Aurea Graff office for knee infection      . nitroGLYCERIN (NITROSTAT) 0.4 MG SL tablet Place 0.4 mg under the tongue every 5 (five) minutes as needed for chest pain.      . potassium chloride SA (K-DUR,KLOR-CON) 20 MEQ tablet Take 20 mEq by mouth 3 (three) times daily.      . pregabalin (LYRICA) 75 MG capsule Take 75 mg by mouth 2 (two) times daily.      . ranitidine (ZANTAC) 150 MG tablet Take 150 mg by  mouth 2 (two) times daily.       . sertraline (ZOLOFT) 50 MG tablet TAKE 1 TABLET EVERY DAY FOR MOOD  90 tablet  3  . traMADol (ULTRAM) 50 MG tablet Take 1 tablet (50 mg total) by mouth every 6 (six) hours as needed for moderate pain or severe pain.  50 tablet  0  . warfarin (COUMADIN) 2 MG tablet Take 1-2 mg by mouth daily. 2 mg daily except 1 mg on MWF per anti-coag clinic      . nitroGLYCERIN (NITRODUR - DOSED IN MG/24 HR) 0.4 mg/hr patch Place 0.4 mg onto the skin daily as needed (chest pain).      . predniSONE (DELTASONE) 20 MG tablet 1 tab 3 x day for 3 days, then 1 tab 2 x day for 3 days, then 1 tab 1 x day for 5 days  20 tablet  0   No current facility-administered medications for this visit.    Allergies  Allergen Reactions  . Codeine Nausea And Vomiting  . Hydrocodone Other (See Comments)    hallucination  . Morphine And Related Nausea And Vomiting  . Requip [Ropinirole  Hcl]     Headache   . Zinc     nausea    Patient Active Problem List   Diagnosis Date Noted  . Atrial fibrillation 12/02/2010    Priority: High  . Acute knee pain 03/03/2014  . Uterine cancer (1995) 02/26/2014  . Encounter for long-term (current) use of other medications 02/15/2014  . Overweight (BMI 25.0-29.9) 12/18/2013  . Infection of prosthetic knee joint 12/17/2013  . PreDiabetes (Jan 2011) 09/24/2013  . Unspecified vitamin D deficiency (19 in 2008) 09/24/2013  . HTN (1960's)   . Hyperlipemia   . Cardiac pacemaker (06/2010) 08/26/2012  . S/P right TKA 02/07/2012  . FHx/o Colon Cancer 07/27/2011  . Gastric adenomatous Polyp 07/27/2011  . Osteoarthritis 07/09/2011  . Aortic stenosis, moderate 07/09/2011  . S/P repair of patent ductus arteriosus 04/22/2011  . Tachycardia-bradycardia syndrome 12/24/2010  . Renal Cell Cancer ( 2003) 11/17/2007  . Hypothyroidism 11/17/2007  . ANXIETY 11/17/2007  . DEPRESSION 11/17/2007  . RESTLESS LEG SYNDROME 11/17/2007  . Gastroparesis 11/17/2007  . RADIATION PROCTITIS 11/17/2007  . FIBROCYSTIC BREAST DISEASE 11/17/2007  . OSTEOMYELITIS 11/17/2007  . CARCINOMA, UTERUS, HX OF 11/17/2007  . RADIATION THERAPY, HX OF 11/17/2007    History  Smoking status  . Never Smoker   Smokeless tobacco  . Never Used    History  Alcohol Use No    Family History  Problem Relation Age of Onset  . Colon cancer Mother 57  . Heart disease Mother   . Heart disease Father   . Heart attack Father   . Heart disease Maternal Grandmother     Review of Systems: Constitutional: no fever chills diaphoresis or fatigue or change in weight.  Head and neck: no hearing loss, no epistaxis, no photophobia or visual disturbance. Respiratory: No cough, shortness of breath or wheezing. Cardiovascular: No chest pain peripheral edema, palpitations. Gastrointestinal: No abdominal distention, no abdominal pain, no change in bowel habits hematochezia or  melena. Genitourinary: No dysuria, no frequency, no urgency, no nocturia. Musculoskeletal:No arthralgias, no back pain, no gait disturbance or myalgias. Neurological: No dizziness, no headaches, no numbness, no seizures, no syncope, no weakness, no tremors. Hematologic: No lymphadenopathy, no easy bruising. Psychiatric: No confusion, no hallucinations, no sleep disturbance.    Physical Exam: Filed Vitals:   05/06/14 1145  BP: 140/78  Pulse:  79  The patient appears to be in no distress.  She is walking with a walker.  The right leg is in a rigid brace to prevent flexion at the knee.  Head and neck exam reveals that the pupils are equal and reactive.  The extraocular movements are full.  There is no scleral icterus.  Mouth and pharynx are benign.  No lymphadenopathy.  No carotid bruits.  The jugular venous pressure is normal.  Thyroid is not enlarged or tender.  Chest is clear to percussion and auscultation.  No rales or rhonchi.  Expansion of the chest is symmetrical.  Heart reveals no abnormal lift or heave.  First and second heart sounds are normal.  There is grade 2/6 systolic ejection murmur at the base.  The abdomen is soft and nontender.  Bowel sounds are normoactive.  There is no hepatosplenomegaly or mass.  There are no abdominal bruits.  Extremities reveal no phlebitis or edema.  Pedal pulses are good.  There is no cyanosis or clubbing.  Neurologic exam is normal strength and no lateralizing weakness.  No sensory deficits.  Integument reveals no rash    Assessment / Plan: 1.  Paroxysmal atrial fibrillation. 2. tachybradycardia syndrome with functioning dual-chamber pacemaker. 3. essential hypertension without heart failure. 4. hypercholesterolemia followed by PCP 5. osteoarthritis of right knee followed by Dr. Alvan Dame 6. mild to moderate aortic stenosis 7. history of left nephrectomy secondary to kidney cancer 8. history of endometrial cancer 9. GERD  Plan: Continue  same medication.  Recheck in 4 months for office visit and EKG

## 2014-05-06 NOTE — Patient Instructions (Signed)
Your physician recommends that you continue on your current medications as directed. Please refer to the Current Medication list given to you today.  Your physician wants you to follow-up in: 4 month ov/ekg  You will receive a reminder letter in the mail two months in advance. If you don't receive a letter, please call our office to schedule the follow-up appointment.  

## 2014-05-06 NOTE — Telephone Encounter (Deleted)
Your physician has requested that you have an echocardiogram. Echocardiography is a painless test that uses sound waves to create images of your heart. It provides your doctor with information about the size and shape of your heart and how well your heart's chambers and valves are working. This procedure takes approximately one hour. There are no restrictions for this procedure.   Your physician has requested that you have a lexiscan myoview. For further information please visit [email or web address]. Please follow instruction sheet, as given.  Follow up as needed

## 2014-05-06 NOTE — Assessment & Plan Note (Signed)
2 weeks ago she had an episode of atrial fibrillation which lasted about 10 days.  She is now back in normal rhythm.  When she was in atrial fibrillation she did have some chest discomfort which responded to 2 nitroglycerins.

## 2014-05-06 NOTE — Telephone Encounter (Signed)
Spoke with daughter and blood pressure was up to 165/85 but back to normal now. Did discuss with  Dr. Mare Ferrari and he recommended daughter calling Dr Wayland Denis since he is the one who treats her blood pressure. Advised daughter and verbalized understanding.

## 2014-05-06 NOTE — Assessment & Plan Note (Signed)
She has a functioning pacemaker.  She has not had any dizzy spells or syncope.

## 2014-05-07 ENCOUNTER — Encounter: Payer: Self-pay | Admitting: Internal Medicine

## 2014-05-07 ENCOUNTER — Ambulatory Visit (INDEPENDENT_AMBULATORY_CARE_PROVIDER_SITE_OTHER): Payer: Medicare Other | Admitting: *Deleted

## 2014-05-07 ENCOUNTER — Telehealth: Payer: Self-pay | Admitting: Cardiology

## 2014-05-07 DIAGNOSIS — I480 Paroxysmal atrial fibrillation: Secondary | ICD-10-CM

## 2014-05-07 DIAGNOSIS — I4891 Unspecified atrial fibrillation: Secondary | ICD-10-CM

## 2014-05-07 DIAGNOSIS — I495 Sick sinus syndrome: Secondary | ICD-10-CM

## 2014-05-07 NOTE — Progress Notes (Signed)
Remote pacemaker transmission.   

## 2014-05-07 NOTE — Telephone Encounter (Signed)
Confirmed remote transmission with pt daughter.  

## 2014-05-08 ENCOUNTER — Encounter: Payer: Self-pay | Admitting: Cardiology

## 2014-05-08 LAB — MDC_IDC_ENUM_SESS_TYPE_REMOTE
Battery Remaining Longevity: 113 mo
Battery Voltage: 2.81 V
Brady Statistic AS VP Percent: 0 %
Brady Statistic AS VS Percent: 5 %
Lead Channel Impedance Value: 405 Ohm
Lead Channel Pacing Threshold Amplitude: 0.875 V
Lead Channel Pacing Threshold Pulse Width: 0.4 ms
Lead Channel Sensing Intrinsic Amplitude: 5.6 mV
Lead Channel Setting Pacing Amplitude: 2 V
Lead Channel Setting Pacing Pulse Width: 0.4 ms
Lead Channel Setting Sensing Sensitivity: 2 mV
MDC IDC MSMT BATTERY IMPEDANCE: 245 Ohm
MDC IDC MSMT LEADCHNL RA IMPEDANCE VALUE: 506 Ohm
MDC IDC MSMT LEADCHNL RA PACING THRESHOLD AMPLITUDE: 0.375 V
MDC IDC MSMT LEADCHNL RA PACING THRESHOLD PULSEWIDTH: 0.4 ms
MDC IDC SESS DTM: 20150901144829
MDC IDC SET LEADCHNL RV PACING AMPLITUDE: 2.5 V
MDC IDC STAT BRADY AP VP PERCENT: 0 %
MDC IDC STAT BRADY AP VS PERCENT: 95 %

## 2014-05-14 ENCOUNTER — Ambulatory Visit (INDEPENDENT_AMBULATORY_CARE_PROVIDER_SITE_OTHER): Payer: Medicare Other | Admitting: Interventional Cardiology

## 2014-05-14 LAB — POCT INR: INR: 1.7

## 2014-05-15 ENCOUNTER — Encounter: Payer: Self-pay | Admitting: Cardiology

## 2014-05-16 ENCOUNTER — Other Ambulatory Visit: Payer: Self-pay | Admitting: Emergency Medicine

## 2014-05-16 ENCOUNTER — Telehealth: Payer: Self-pay | Admitting: *Deleted

## 2014-05-16 MED ORDER — ALPRAZOLAM 0.5 MG PO TABS: ORAL_TABLET | ORAL | Status: DC

## 2014-05-16 NOTE — Telephone Encounter (Signed)
[facility] called and reported patient upset and having frequent diarrhea due to recent death of son in law.  Spoke with patient and advised take Immodium for diarrhea and call in RX for Xanax 0.5 mg for patient.

## 2014-05-22 ENCOUNTER — Ambulatory Visit: Payer: Self-pay | Admitting: Physician Assistant

## 2014-05-28 ENCOUNTER — Telehealth: Payer: Self-pay

## 2014-05-28 ENCOUNTER — Encounter (HOSPITAL_COMMUNITY): Payer: Self-pay | Admitting: Emergency Medicine

## 2014-05-28 ENCOUNTER — Ambulatory Visit (INDEPENDENT_AMBULATORY_CARE_PROVIDER_SITE_OTHER): Payer: Medicare Other | Admitting: Cardiology

## 2014-05-28 ENCOUNTER — Observation Stay (HOSPITAL_COMMUNITY): Payer: Medicare Other

## 2014-05-28 ENCOUNTER — Observation Stay (HOSPITAL_COMMUNITY)
Admission: EM | Admit: 2014-05-28 | Discharge: 2014-06-01 | Disposition: A | Discharge status: Home / Self Care | Payer: Medicare Other | Attending: Internal Medicine | Admitting: Internal Medicine

## 2014-05-28 ENCOUNTER — Emergency Department (HOSPITAL_COMMUNITY): Payer: Medicare Other

## 2014-05-28 DIAGNOSIS — E039 Hypothyroidism, unspecified: Secondary | ICD-10-CM | POA: Diagnosis not present

## 2014-05-28 DIAGNOSIS — Z8542 Personal history of malignant neoplasm of other parts of uterus: Secondary | ICD-10-CM | POA: Diagnosis not present

## 2014-05-28 DIAGNOSIS — E785 Hyperlipidemia, unspecified: Secondary | ICD-10-CM | POA: Diagnosis not present

## 2014-05-28 DIAGNOSIS — I350 Nonrheumatic aortic (valve) stenosis: Secondary | ICD-10-CM | POA: Diagnosis present

## 2014-05-28 DIAGNOSIS — R945 Abnormal results of liver function studies: Secondary | ICD-10-CM

## 2014-05-28 DIAGNOSIS — N289 Disorder of kidney and ureter, unspecified: Secondary | ICD-10-CM | POA: Insufficient documentation

## 2014-05-28 DIAGNOSIS — G2581 Restless legs syndrome: Secondary | ICD-10-CM | POA: Insufficient documentation

## 2014-05-28 DIAGNOSIS — Z79899 Other long term (current) drug therapy: Secondary | ICD-10-CM | POA: Diagnosis not present

## 2014-05-28 DIAGNOSIS — R42 Dizziness and giddiness: Secondary | ICD-10-CM | POA: Diagnosis not present

## 2014-05-28 DIAGNOSIS — E782 Mixed hyperlipidemia: Secondary | ICD-10-CM | POA: Diagnosis present

## 2014-05-28 DIAGNOSIS — I495 Sick sinus syndrome: Secondary | ICD-10-CM | POA: Insufficient documentation

## 2014-05-28 DIAGNOSIS — I119 Hypertensive heart disease without heart failure: Secondary | ICD-10-CM

## 2014-05-28 DIAGNOSIS — R110 Nausea: Secondary | ICD-10-CM | POA: Diagnosis not present

## 2014-05-28 DIAGNOSIS — R197 Diarrhea, unspecified: Secondary | ICD-10-CM | POA: Insufficient documentation

## 2014-05-28 DIAGNOSIS — I359 Nonrheumatic aortic valve disorder, unspecified: Secondary | ICD-10-CM | POA: Insufficient documentation

## 2014-05-28 DIAGNOSIS — N059 Unspecified nephritic syndrome with unspecified morphologic changes: Secondary | ICD-10-CM | POA: Insufficient documentation

## 2014-05-28 DIAGNOSIS — R55 Syncope and collapse: Secondary | ICD-10-CM | POA: Diagnosis present

## 2014-05-28 DIAGNOSIS — Z7901 Long term (current) use of anticoagulants: Secondary | ICD-10-CM | POA: Insufficient documentation

## 2014-05-28 DIAGNOSIS — R011 Cardiac murmur, unspecified: Secondary | ICD-10-CM | POA: Diagnosis not present

## 2014-05-28 DIAGNOSIS — Z950 Presence of cardiac pacemaker: Secondary | ICD-10-CM | POA: Diagnosis not present

## 2014-05-28 DIAGNOSIS — I129 Hypertensive chronic kidney disease with stage 1 through stage 4 chronic kidney disease, or unspecified chronic kidney disease: Secondary | ICD-10-CM | POA: Diagnosis not present

## 2014-05-28 DIAGNOSIS — I4891 Unspecified atrial fibrillation: Secondary | ICD-10-CM | POA: Insufficient documentation

## 2014-05-28 DIAGNOSIS — Z9889 Other specified postprocedural states: Secondary | ICD-10-CM | POA: Insufficient documentation

## 2014-05-28 DIAGNOSIS — M869 Osteomyelitis, unspecified: Secondary | ICD-10-CM | POA: Diagnosis not present

## 2014-05-28 DIAGNOSIS — N189 Chronic kidney disease, unspecified: Secondary | ICD-10-CM | POA: Insufficient documentation

## 2014-05-28 DIAGNOSIS — K219 Gastro-esophageal reflux disease without esophagitis: Secondary | ICD-10-CM | POA: Diagnosis not present

## 2014-05-28 DIAGNOSIS — Z23 Encounter for immunization: Secondary | ICD-10-CM | POA: Insufficient documentation

## 2014-05-28 DIAGNOSIS — G589 Mononeuropathy, unspecified: Secondary | ICD-10-CM | POA: Insufficient documentation

## 2014-05-28 DIAGNOSIS — Z8774 Personal history of (corrected) congenital malformations of heart and circulatory system: Secondary | ICD-10-CM

## 2014-05-28 DIAGNOSIS — I2510 Atherosclerotic heart disease of native coronary artery without angina pectoris: Secondary | ICD-10-CM | POA: Diagnosis not present

## 2014-05-28 DIAGNOSIS — I351 Nonrheumatic aortic (valve) insufficiency: Secondary | ICD-10-CM

## 2014-05-28 DIAGNOSIS — R7989 Other specified abnormal findings of blood chemistry: Secondary | ICD-10-CM | POA: Diagnosis not present

## 2014-05-28 DIAGNOSIS — F411 Generalized anxiety disorder: Secondary | ICD-10-CM | POA: Insufficient documentation

## 2014-05-28 DIAGNOSIS — I77810 Thoracic aortic ectasia: Secondary | ICD-10-CM | POA: Diagnosis not present

## 2014-05-28 DIAGNOSIS — Z01810 Encounter for preprocedural cardiovascular examination: Secondary | ICD-10-CM

## 2014-05-28 LAB — COMPREHENSIVE METABOLIC PANEL
ALT: 121 U/L — ABNORMAL HIGH (ref 0–35)
AST: 194 U/L — AB (ref 0–37)
Albumin: 2.2 g/dL — ABNORMAL LOW (ref 3.5–5.2)
Alkaline Phosphatase: 194 U/L — ABNORMAL HIGH (ref 39–117)
Anion gap: 10 (ref 5–15)
BUN: 35 mg/dL — AB (ref 6–23)
CALCIUM: 8.5 mg/dL (ref 8.4–10.5)
CO2: 30 meq/L (ref 19–32)
CREATININE: 1.26 mg/dL — AB (ref 0.50–1.10)
Chloride: 101 mEq/L (ref 96–112)
GFR, EST AFRICAN AMERICAN: 46 mL/min — AB (ref >90)
GFR, EST NON AFRICAN AMERICAN: 40 mL/min — AB (ref >90)
GLUCOSE: 82 mg/dL (ref 70–99)
Potassium: 3.3 mEq/L — ABNORMAL LOW (ref 3.7–5.3)
Sodium: 141 mEq/L (ref 137–147)
Total Bilirubin: 0.7 mg/dL (ref 0.3–1.2)
Total Protein: 6.2 g/dL (ref 6.0–8.3)

## 2014-05-28 LAB — URINALYSIS, ROUTINE W REFLEX MICROSCOPIC
BILIRUBIN URINE: NEGATIVE
Glucose, UA: NEGATIVE mg/dL
HGB URINE DIPSTICK: NEGATIVE
Ketones, ur: NEGATIVE mg/dL
Nitrite: NEGATIVE
PROTEIN: NEGATIVE mg/dL
Specific Gravity, Urine: 1.008 (ref 1.005–1.030)
Urobilinogen, UA: 0.2 mg/dL (ref 0.0–1.0)
pH: 6 (ref 5.0–8.0)

## 2014-05-28 LAB — CBC WITH DIFFERENTIAL/PLATELET
BASOS ABS: 0 10*3/uL (ref 0.0–0.1)
Basophils Relative: 0 % (ref 0–1)
EOS PCT: 4 % (ref 0–5)
Eosinophils Absolute: 0.2 10*3/uL (ref 0.0–0.7)
HEMATOCRIT: 41.9 % (ref 36.0–46.0)
Hemoglobin: 14.2 g/dL (ref 12.0–15.0)
Lymphocytes Relative: 25 % (ref 12–46)
Lymphs Abs: 1.6 10*3/uL (ref 0.7–4.0)
MCH: 30.9 pg (ref 26.0–34.0)
MCHC: 33.9 g/dL (ref 30.0–36.0)
MCV: 91.3 fL (ref 78.0–100.0)
MONO ABS: 0.8 10*3/uL (ref 0.1–1.0)
Monocytes Relative: 14 % — ABNORMAL HIGH (ref 3–12)
Neutro Abs: 3.5 10*3/uL (ref 1.7–7.7)
Neutrophils Relative %: 57 % (ref 43–77)
Platelets: 93 10*3/uL — ABNORMAL LOW (ref 150–400)
RBC: 4.59 MIL/uL (ref 3.87–5.11)
RDW: 14 % (ref 11.5–15.5)
WBC: 6.2 10*3/uL (ref 4.0–10.5)

## 2014-05-28 LAB — URINE MICROSCOPIC-ADD ON

## 2014-05-28 LAB — TROPONIN I
Troponin I: <0.3 ng/mL (ref <0.30)
Troponin I: <0.3 ng/mL (ref <0.30)

## 2014-05-28 LAB — PROTIME-INR
INR: 2.56 — ABNORMAL HIGH (ref 0.00–1.49)
PROTHROMBIN TIME: 27.5 s — AB (ref 11.6–15.2)

## 2014-05-28 LAB — POCT INR: INR: 3.2

## 2014-05-28 LAB — TSH: TSH: 0.54 u[IU]/mL (ref 0.350–4.500)

## 2014-05-28 LAB — LIPASE, BLOOD: Lipase: 37 U/L (ref 11–59)

## 2014-05-28 LAB — CBG MONITORING, ED: Glucose-Capillary: 84 mg/dL (ref 70–99)

## 2014-05-28 MED ORDER — SODIUM CHLORIDE 0.9 % IJ SOLN
3.0000 mL | Freq: Two times a day (BID) | INTRAMUSCULAR | Status: DC
  Administered 2014-05-29: 3 mL via INTRAVENOUS

## 2014-05-28 MED ORDER — FUROSEMIDE 20 MG PO TABS
20.0000 mg | ORAL_TABLET | Freq: Every day | ORAL | Status: DC
  Administered 2014-05-29: 20 mg via ORAL
  Filled 2014-05-28 (×2): qty 1

## 2014-05-28 MED ORDER — WARFARIN SODIUM 2 MG PO TABS
2.0000 mg | ORAL_TABLET | ORAL | Status: DC
  Filled 2014-05-28: qty 1

## 2014-05-28 MED ORDER — WARFARIN SODIUM 1 MG PO TABS
1.0000 mg | ORAL_TABLET | ORAL | Status: DC
  Administered 2014-05-28 – 2014-05-31 (×4): 1 mg via ORAL
  Filled 2014-05-28 (×5): qty 1

## 2014-05-28 MED ORDER — ALUM & MAG HYDROXIDE-SIMETH 200-200-20 MG/5ML PO SUSP: 30.0000 mL | Freq: Four times a day (QID) | ORAL | Status: DC | PRN

## 2014-05-28 MED ORDER — MINOCYCLINE HCL 100 MG PO CAPS
100.0000 mg | ORAL_CAPSULE | Freq: Two times a day (BID) | ORAL | Status: DC
  Administered 2014-05-28 – 2014-06-01 (×8): 100 mg via ORAL
  Filled 2014-05-28 (×9): qty 1

## 2014-05-28 MED ORDER — SODIUM CHLORIDE 0.9 % IV SOLN: 250.0000 mL | INTRAVENOUS | Status: DC | PRN

## 2014-05-28 MED ORDER — LEVOTHYROXINE SODIUM 100 MCG PO TABS
100.0000 ug | ORAL_TABLET | Freq: Every day | ORAL | Status: DC
  Administered 2014-05-29 – 2014-06-01 (×4): 100 ug via ORAL
  Filled 2014-05-28 (×5): qty 1

## 2014-05-28 MED ORDER — POTASSIUM CHLORIDE CRYS ER 20 MEQ PO TBCR
40.0000 meq | EXTENDED_RELEASE_TABLET | Freq: Once | ORAL | Status: AC
  Administered 2014-05-28: 40 meq via ORAL
  Filled 2014-05-28: qty 2

## 2014-05-28 MED ORDER — ONDANSETRON HCL 4 MG/2ML IJ SOLN: 4.0000 mg | Freq: Four times a day (QID) | INTRAMUSCULAR | Status: DC | PRN

## 2014-05-28 MED ORDER — WARFARIN - PHYSICIAN DOSING INPATIENT: Freq: Every day | Status: DC

## 2014-05-28 MED ORDER — WARFARIN SODIUM 1 MG PO TABS: 1.0000 mg | ORAL_TABLET | ORAL | Status: DC

## 2014-05-28 MED ORDER — SODIUM CHLORIDE 0.9 % IV BOLUS (SEPSIS)
500.0000 mL | Freq: Once | INTRAVENOUS | Status: AC
  Administered 2014-05-28: 500 mL via INTRAVENOUS

## 2014-05-28 MED ORDER — SODIUM CHLORIDE 0.9 % IJ SOLN
3.0000 mL | INTRAMUSCULAR | Status: DC | PRN
  Administered 2014-05-28: 3 mL via INTRAVENOUS

## 2014-05-28 MED ORDER — ONDANSETRON HCL 4 MG PO TABS
4.0000 mg | ORAL_TABLET | Freq: Four times a day (QID) | ORAL | Status: DC | PRN
  Administered 2014-05-29 – 2014-05-30 (×3): 4 mg via ORAL
  Filled 2014-05-28 (×3): qty 1

## 2014-05-28 MED ORDER — SODIUM CHLORIDE 0.9 % IJ SOLN: 3.0000 mL | Freq: Two times a day (BID) | INTRAMUSCULAR | Status: DC

## 2014-05-28 MED ORDER — ATORVASTATIN CALCIUM 80 MG PO TABS
80.0000 mg | ORAL_TABLET | Freq: Every day | ORAL | Status: DC
  Administered 2014-05-29: 80 mg via ORAL
  Filled 2014-05-28: qty 1

## 2014-05-28 MED ORDER — ONDANSETRON HCL 4 MG/2ML IJ SOLN
4.0000 mg | Freq: Once | INTRAMUSCULAR | Status: AC
  Administered 2014-05-28: 4 mg via INTRAVENOUS
  Filled 2014-05-28: qty 2

## 2014-05-28 NOTE — H&P (Signed)
PCP:   [name], MD   Chief Complaint:  Passed out  HPI 80 yo female h/o tachy brady syndrome s/p pacemaker, CAD, gastroparesis, chronic diarrhea, moderate aortic stenosis, recent knee replacement infected, comes in after syncope today.  She was getting up and walking when she got very dizzy and passed out to the floor.  Her dtr was present and broke her fall.  Family reports these dizzy spells have been going on for months, and they have addressed this with both her cardiologist and pcp.  Cardiologist advised for them to see her pcp, which they did but no medications were adjusted, but another medicine was added which they do not recall what that was.  They check her bp when these symptoms occur and it is usually very low.  She denies symptoms right now, no sob, no cp no recent fevers.  She has been dealing with a recent knee surgery with postop infection, and ligament repair and is on abx chronically for that at the moment.  She has chronic diarrhea, no change in that.  No focal neuro deficits.    Pt also c/o nausea for months, she has known h/o gastroparesis and was previously on erythomycin which helped and reglan which also helped but both of these were stopped for unknown reasons.  They have not been able to f/u with her gi doctor lately because of all the issues with her knee.  Review of Systems:  Positive and negative as per HPI otherwise all other systems are negative  Past Medical History: Past Medical History  Diagnosis Date  . PAF (paroxysmal atrial fibrillation)     controlled with amiodarone, on coumadin  . CAD (coronary artery disease)   . Aortic root dilatation   . Moderate aortic stenosis   . Chronic anticoagulation   . Tachycardia-bradycardia syndrome     s/p PPM by Dr Doreatha Lew ([facility]) 07/03/10  . GERD (gastroesophageal reflux disease)   . Chronic kidney disease   . Pacemaker     2012  . History of nephrectomy     left - due to kidney cancer  . Neuropathy      feet/legs  . Restless leg syndrome   . Headache(784.0)     hx migraines - takes Zoloft for migraines  . History of uterine cancer   . Cancer     hx Kidney cancer / hx of endometrial cancer  . PONV (postoperative nausea and vomiting)   . Anxiety   . Heart murmur   . HTN (hypertension)   . Hyperlipemia   . Hypothyroidism   . 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 Osteomyelitis as child  . Family history of anesthesia complication     daughter has ponv   Past Surgical History  Procedure Laterality Date  . Cardiac catheterization  2008  . Nephrectomy    . Total abdominal hysterectomy    . Patent ductus arterious repair  age 34  . Ptvp    . Pacemaker insertion  07/04/11    by Dr Doreatha Lew  . Cholecystectomy    . Upper gastrointestinal endoscopy    . Total knee arthroplasty  02/07/2012    Procedure: TOTAL KNEE ARTHROPLASTY;  Surgeon: Mauri Pole, MD;  Location: [facility];  Service: Orthopedics;  Laterality: Right;  . Insert / replace / remove pacemaker    . Joint replacement      rt total hip 2009  . I&d knee with poly exchange Right 12/17/2013    Procedure: IRRIGATION AND  DEBRIDEMEN RIGHT TOTAL  KNEE WITH POLY EXCHANGE;  Surgeon: Mauri Pole, MD;  Location: [facility];  Service: Orthopedics;  Laterality: Right;  . Patellar tendon repair Right 03/06/2014    Procedure: AVULSION WITH PATELLA TENDON REPAIR;  Surgeon: Mauri Pole, MD;  Location: [facility];  Service: Orthopedics;  Laterality: Right;    Medications: Prior to Admission medications   Medication Sig Start Date End Date Taking? Authorizing Provider  acetaminophen (TYLENOL) 500 MG tablet Take 1,000 mg by mouth every 6 (six) hours as needed for moderate pain.   Yes Historical Provider, MD  ALPRAZolam Duanne Moron) 0.5 MG tablet Take 0.5 mg by mouth daily as needed for anxiety.   Yes Historical Provider, MD  amiodarone (PACERONE) 200 MG tablet Take 200 mg by mouth daily.   Yes Historical Provider, MD  atenolol (TENORMIN) 100 MG tablet Take 50  mg by mouth 2 (two) times daily.    Yes Historical Provider, MD  atorvastatin (LIPITOR) 80 MG tablet Take 80 mg by mouth daily.   Yes Historical Provider, MD  Biotin 5000 MCG CAPS Take 5,000 mcg by mouth 2 (two) times daily.   Yes Historical Provider, MD  Cholecalciferol (VITAMIN D3) 2000 UNITS TABS Take 2,000 Units by mouth 2 (two) times daily.    Yes Historical Provider, MD  furosemide (LASIX) 40 MG tablet Take 20 mg by mouth daily.    Yes Historical Provider, MD  levothyroxine (SYNTHROID, LEVOTHROID) 100 MCG tablet Take 100 mcg by mouth daily before breakfast.   Yes Historical Provider, MD  losartan-hydrochlorothiazide (HYZAAR) 100-25 MG per tablet Take 0.5 tablets by mouth every morning.   Yes Historical Provider, MD  Magnesium 250 MG TABS Take 250 mg by mouth daily.    Yes Historical Provider, MD  meclizine (ANTIVERT) 25 MG tablet Take 25 mg by mouth as needed for dizziness or nausea. 02/26/14 02/26/15 Yes [name], MD  methocarbamol (ROBAXIN) 500 MG tablet Take 500 mg by mouth every 6 (six) hours as needed for muscle spasms.   Yes Historical Provider, MD  minocycline (MINOCIN,DYNACIN) 100 MG capsule Take 100 mg by mouth 2 (two) times daily. Long-term antibiotic per Dr. Aurea Graff office for knee infection 02/06/14  Yes Historical Provider, MD  nitroGLYCERIN (NITRODUR - DOSED IN MG/24 HR) 0.4 mg/hr patch Place 0.4 mg onto the skin daily as needed (chest pain).   Yes Historical Provider, MD  nitroGLYCERIN (NITROSTAT) 0.4 MG SL tablet Place 0.4 mg under the tongue every 5 (five) minutes as needed for chest pain.   Yes Historical Provider, MD  potassium chloride SA (K-DUR,KLOR-CON) 20 MEQ tablet Take 20 mEq by mouth 3 (three) times daily. 08/29/12  Yes Eugenie Filler, MD  pregabalin (LYRICA) 75 MG capsule Take 75 mg by mouth 2 (two) times daily.   Yes Historical Provider, MD  ranitidine (ZANTAC) 150 MG tablet Take 150 mg by mouth 2 (two) times daily.    Yes Historical Provider, MD  sertraline  (ZOLOFT) 50 MG tablet Take 50 mg by mouth daily.   Yes Historical Provider, MD  traMADol (ULTRAM) 50 MG tablet Take 50 mg by mouth every 6 (six) hours as needed for moderate pain.    Yes Historical Provider, MD  warfarin (COUMADIN) 2 MG tablet Take 1-2 mg by mouth daily. 2 mg daily except 1 mg on MWF per anti-coag clinic   Yes Historical Provider, MD    Allergies:   Allergies  Allergen Reactions  . Codeine Nausea And Vomiting  . Hydrocodone Other (  See Comments)    hallucination  . Morphine And Related Nausea And Vomiting  . Requip [Ropinirole Hcl]     Headache   . Zinc     nausea    Social History:  reports that she has never smoked. She has never used smokeless tobacco. She reports that she does not drink alcohol or use illicit drugs.  Family History: Family History  Problem Relation Age of Onset  . Colon cancer Mother 21  . Heart disease Mother   . Heart disease Father   . Heart attack Father   . Heart disease Maternal Grandmother     Physical Exam: Filed Vitals:   05/28/14 1600 05/28/14 1613 05/28/14 1630 05/28/14 1700  BP: 143/69  143/55 128/61  Pulse: 63  62 58  Temp:      TempSrc:      Resp: 13  11 12   Height:      Weight:      SpO2: 92% 95% 100% 97%   General appearance: alert, cooperative and no distress Head: Normocephalic, without obvious abnormality, atraumatic Eyes: negative Nose: Nares normal. Septum midline. Mucosa normal. No drainage or sinus tenderness. Neck: no JVD and supple, symmetrical, trachea midline Lungs: clear to auscultation bilaterally Heart: regular rate and rhythm and systolic murmur: early systolic 3/6, blowing at 2nd left intercostal space Abdomen: soft, non-tender; bowel sounds normal; no masses,  no organomegaly Extremities: extremities normal, atraumatic, no cyanosis or edema brace on rle Pulses: 2+ and symmetric Skin: Skin color, texture, turgor normal. No rashes or lesions Neurologic: Grossly normal    Labs on Admission:    Recent Labs  05/28/14 1621  NA 141  K 3.3*  CL 101  CO2 30  GLUCOSE 82  BUN 35*  CREATININE 1.26*  CALCIUM 8.5    Recent Labs  05/28/14 1621  AST 194*  ALT 121*  ALKPHOS 194*  BILITOT 0.7  PROT 6.2  ALBUMIN 2.2*    Recent Labs  05/28/14 1621  WBC 6.2  NEUTROABS 3.5  HGB 14.2  HCT 41.9  MCV 91.3  PLT 93*    Recent Labs  05/28/14 1622  TROPONINI <0.30   Radiological Exams on Admission: Dg Chest 2 View  05/28/2014   CLINICAL DATA:  Syncope, shortness of breath, history coronary artery disease, hypertension  EXAM: CHEST  2 VIEW  COMPARISON:  12/18/2013  FINDINGS: RIGHT subclavian transvenous pacemaker leads project at RIGHT atrium and RIGHT ventricle.  Upper normal heart size.  Atherosclerotic calcification aorta.  Mediastinal contours pulmonary vascularity normal.  Emphysematous and bronchitic changes consistent with COPD.  Lungs clear.  No pleural effusion or pneumothorax.  Bones demineralized.  IMPRESSION: Pacemaker.  COPD changes.  No acute abnormalities.   Electronically Signed   By: Lavonia Dana M.D.   On: 05/28/2014 17:57    Assessment/Plan  80 yo female with recurrent syncopal episodes suspect due to labile blood pressure/orthostatic hypotension  Principal Problem:   Syncope-  Is not significantly orthostatic in ED.  Will order ortho vitals qshift.  Hold her bp meds for now.  Serial enzymes and ck echo in am to assess aortic valve again.  Ct head is also pending to r/o obvious primary neuro problem.  Consider calling her cardiologist in am for further recommendations.  Active Problems:  Stable unless o/w noted.  Cont home meds x bp meds.   ANXIETY   Gastroparesis-  Sound like this has been uncontrolled, unclear why erythmycin and reglan were stopped.  Consider restarting one of  those, may have been stopped due to her recent infectious issues and chronic diarrhea.   Atrial fibrillation   Tachycardia-bradycardia syndrome   S/P repair of patent ductus  arteriosus   Aortic stenosis, moderate   Cardiac pacemaker (06/2010)   Hyperlipemia   Encounter for long-term (current) use of other medications   Elevated LFTs-  Is on a statin, will stop that.  Will also check abd u/s and acute hep panel.     Renal insufficiency, mild  obs on tele.  Full code.  Aryiana Klinkner A 05/28/2014, 6:54 PM

## 2014-05-28 NOTE — ED Notes (Signed)
80 yo female by [facility] with near syncopal episode. Daughter was assisting pt to bath room when episode occurred. Daughter eased pt to the floor where she crawled into the bathroom. C/O Upper right abd pain, dull and rads to the back that is intermittent with diarrhea. Hx Right Knee surgery, antibiotic use for 5/6 months, HTN and Pacemaker. Denies Vision changes or LOC. Alert and Oriented x3   BP180/100

## 2014-05-28 NOTE — Telephone Encounter (Signed)
Patient's daughter called and said patient had another "spell" and fainted this morning. Patient is now alert and daughter states there are no injuries.Most recent blood pressure is 99/50. After fainting, the patient's blood pressure could not be read. Per Dr.McKeown, patient needs to go to ER for evaluation. Patient's daughter aware.

## 2014-05-28 NOTE — ED Notes (Signed)
MD at bedside. 

## 2014-05-28 NOTE — ED Provider Notes (Signed)
CSN: [id number]     Arrival date & time 05/28/14  1524 History   First MD Initiated Contact with Patient 05/28/14 1529     Chief Complaint  Patient presents with  . Near Syncope   (Consider location/radiation/quality/duration/timing/severity/associated sxs/prior Treatment) HPI [name] is a 80 yo female presenting with report of syncopal episode 2 hours ago.  Family states she was walking to the bathroom when she began feeling weak and light headed, and had to lay down.  During this time her mental status was altered and she was confused and disoriented.  She was incontinent of stool while laying on the floor.  She has had a long course of not feeling well since surgery on knee in March and again in July 2015.  She reports frequently feeling light-headed when changing positions and frequently nauseated. The family denies seizure activity or loss of consciousness.  The pt denies headache, vomiting, shortness of breath or chest pain.   Past Medical History  Diagnosis Date  . PAF (paroxysmal atrial fibrillation)     controlled with amiodarone, on coumadin  . CAD (coronary artery disease)   . Aortic root dilatation   . Moderate aortic stenosis   . Chronic anticoagulation   . Tachycardia-bradycardia syndrome     s/p PPM by Dr Doreatha Lew (MDT) 07/03/10  . GERD (gastroesophageal reflux disease)   . Chronic kidney disease   . Pacemaker     2012  . History of nephrectomy     left - due to kidney cancer  . Neuropathy     feet/legs  . Restless leg syndrome   . Headache(784.0)     hx migraines - takes Zoloft for migraines  . History of uterine cancer   . Cancer     hx Kidney cancer / hx of endometrial cancer  . PONV (postoperative nausea and vomiting)   . Anxiety   . Heart murmur   . HTN (hypertension)   . Hyperlipemia   . Hypothyroidism   . 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 Osteomyelitis as child  . Family history of anesthesia complication     daughter has ponv   Past Surgical  History  Procedure Laterality Date  . Cardiac catheterization  2008  . Nephrectomy    . Total abdominal hysterectomy    . Patent ductus arterious repair  age 87  . Ptvp    . Pacemaker insertion  07/04/11    by Dr Doreatha Lew  . Cholecystectomy    . Upper gastrointestinal endoscopy    . Total knee arthroplasty  02/07/2012    Procedure: TOTAL KNEE ARTHROPLASTY;  Surgeon: Mauri Pole, MD;  Location: [facility];  Service: Orthopedics;  Laterality: Right;  . Insert / replace / remove pacemaker    . Joint replacement      rt total hip 2009  . I&d knee with poly exchange Right 12/17/2013    Procedure: IRRIGATION AND DEBRIDEMEN RIGHT TOTAL  KNEE WITH POLY EXCHANGE;  Surgeon: Mauri Pole, MD;  Location: [facility];  Service: Orthopedics;  Laterality: Right;  . Patellar tendon repair Right 03/06/2014    Procedure: AVULSION WITH PATELLA TENDON REPAIR;  Surgeon: Mauri Pole, MD;  Location: [facility];  Service: Orthopedics;  Laterality: Right;   Family History  Problem Relation Age of Onset  . Colon cancer Mother 79  . Heart disease Mother   . Heart disease Father   . Heart attack Father   . Heart disease Maternal Grandmother  History  Substance Use Topics  . Smoking status: Never Smoker   . Smokeless tobacco: Never Used  . Alcohol Use: No   OB History   Grav Para Term Preterm Abortions TAB SAB Ect Mult Living                 Review of Systems  Constitutional: Negative for fever and chills.  HENT: Negative for sore throat.   Eyes: Negative for visual disturbance.  Respiratory: Negative for cough and shortness of breath.   Cardiovascular: Negative for chest pain and leg swelling.  Gastrointestinal: Positive for nausea and diarrhea. Negative for vomiting.  Genitourinary: Negative for dysuria.  Musculoskeletal: Negative for myalgias.  Skin: Negative for rash.  Neurological: Positive for syncope, weakness and light-headedness. Negative for numbness and headaches.      Allergies   Codeine; Hydrocodone; Morphine and related; Requip; and Zinc  Home Medications   Prior to Admission medications   Medication Sig Start Date End Date Taking? Authorizing Provider  acetaminophen (TYLENOL) 500 MG tablet Take 1,000 mg by mouth every 6 (six) hours as needed for moderate pain.    Historical Provider, MD  ALPRAZolam Prudy Feeler) 0.5 MG tablet Take 1/2 to 1 tablet by mouth as needed for anxiety. 05/16/14   Lucky Cowboy, MD  amiodarone (PACERONE) 200 MG tablet TAKE 1 TAB DAILY FOR ATRIAL FIB. 03/22/14   Lucky Cowboy, MD  atenolol (TENORMIN) 100 MG tablet Take 50 mg by mouth 2 (two) times daily.     Historical Provider, MD  atorvastatin (LIPITOR) 80 MG tablet TAKE 1/2-1 TABLET EVERY DAY AS DIRECTED FOR CHOLESTEROL    Lucky Cowboy, MD  Biotin 5000 MCG CAPS Take 5,000 mcg by mouth 2 (two) times daily.    Historical Provider, MD  Cholecalciferol (VITAMIN D3) 2000 UNITS TABS Take 2,000 Units by mouth 2 (two) times daily.     Historical Provider, MD  furosemide (LASIX) 40 MG tablet Take 40 mg by mouth as directed. 1/2 tablet daily    Historical Provider, MD  KLOR-CON M20 20 MEQ tablet TAKE 1 TAB 3 TIMES A DAY 04/25/14   Lucky Cowboy, MD  levothyroxine (SYNTHROID, LEVOTHROID) 100 MCG tablet Take 100 mcg by mouth daily before breakfast.    Historical Provider, MD  losartan-hydrochlorothiazide (HYZAAR) 100-25 MG per tablet Take 0.5 tablets by mouth every morning.    Historical Provider, MD  LYRICA 75 MG capsule TAKE ONE CAPSULE TWICE A DAY 05/17/14   Lucky Cowboy, MD  Magnesium 250 MG TABS Take 250 mg by mouth daily.     Historical Provider, MD  meclizine (ANTIVERT) 25 MG tablet Take 25 mg by mouth as needed for dizziness or nausea. 02/26/14 02/26/15  Lucky Cowboy, MD  methocarbamol (ROBAXIN) 500 MG tablet Take 1 tablet (500 mg total) by mouth every 6 (six) hours as needed for muscle spasms. 03/08/14   Genelle Gather Babish, PA-C  minocycline (MINOCIN,DYNACIN) 100 MG capsule Take 100 mg by  mouth 2 (two) times daily. Long-term antibiotic per Dr. Nilsa Nutting office for knee infection 02/06/14   Historical Provider, MD  nitroGLYCERIN (NITRODUR - DOSED IN MG/24 HR) 0.4 mg/hr patch Place 0.4 mg onto the skin daily as needed (chest pain).    Historical Provider, MD  nitroGLYCERIN (NITROSTAT) 0.4 MG SL tablet Place 0.4 mg under the tongue every 5 (five) minutes as needed for chest pain.    Historical Provider, MD  potassium chloride SA (K-DUR,KLOR-CON) 20 MEQ tablet Take 20 mEq by mouth 3 (three) times daily. 08/29/12  Eugenie Filler, MD  predniSONE (DELTASONE) 20 MG tablet 1 tab 3 x day for 3 days, then 1 tab 2 x day for 3 days, then 1 tab 1 x day for 5 days 02/26/14   [name], MD  ranitidine (ZANTAC) 150 MG tablet Take 150 mg by mouth 2 (two) times daily.     Historical Provider, MD  sertraline (ZOLOFT) 50 MG tablet TAKE 1 TABLET EVERY DAY FOR MOOD 04/18/14   [name], MD  traMADol (ULTRAM) 50 MG tablet Take 1 tablet (50 mg total) by mouth every 6 (six) hours as needed for moderate pain or severe pain. 03/08/14   Lucille Passy Babish, PA-C  warfarin (COUMADIN) 2 MG tablet Take 1-2 mg by mouth daily. 2 mg daily except 1 mg on MWF per anti-coag clinic    Historical Provider, MD   BP 159/73  Pulse 82  Temp(Src) 98.3 F (36.8 C) (Oral)  Resp 20  Ht $R'5\' 1"'Nk$  (1.549 m)  Wt 140 lb (63.504 kg)  BMI 26.47 kg/m2  SpO2 99% Physical Exam  Nursing note and vitals reviewed. Constitutional: She is oriented to person, place, and time. She appears well-developed and well-nourished. No distress.  HENT:  Head: Normocephalic and atraumatic.  Mouth/Throat: Oropharynx is clear and moist. No oropharyngeal exudate.  Eyes: Conjunctivae are normal.  Neck: Neck supple. No thyromegaly present.  Cardiovascular: Normal rate, regular rhythm, S1 normal, S2 normal and intact distal pulses.  Exam reveals no gallop and no friction rub.   Murmur heard.  Systolic murmur is present with a grade of 3/6   Pulses:      Radial pulses are 2+ on the right side, and 2+ on the left side.       Dorsalis pedis pulses are 2+ on the right side, and 2+ on the left side.  Pulmonary/Chest: Effort normal and breath sounds normal. No respiratory distress. She has no wheezes. She has no rales. She exhibits no tenderness.  Abdominal: Soft. She exhibits no distension and no mass. There is no tenderness. There is no rigidity, no rebound, no guarding, no tenderness at McBurney's point and negative Murphy's sign.  Musculoskeletal: She exhibits no tenderness.  Lymphadenopathy:    She has no cervical adenopathy.  Neurological: She is alert and oriented to person, place, and time. No cranial nerve deficit. Coordination normal.  Skin: Skin is warm and dry. No rash noted. She is not diaphoretic.  Psychiatric: She has a normal mood and affect.    ED Course  Procedures (including critical care time) Labs Review Labs Reviewed - No data to display  Imaging Review No results found.   EKG Interpretation   Date/Time:  Tuesday May 28 2014 15:39:26 EDT Ventricular Rate:  74 PR Interval:  198 QRS Duration: 97 QT Interval:  478 QTC Calculation: 530 R Axis:   25 Text Interpretation:  Sinus rhythm Borderline repolarization abnormality  Non-specific ST-t changes Prolonged QT interval Confirmed by Ashok Cordia  MD,  Lennette Bihari ([id number]) on 05/28/2014 4:06:12 PM      MDM   Final diagnoses:  Syncope, unspecified syncope type  Aortic stenosis  Aortic regurgitation  Renal insufficiency  Pacemaker  Elevated liver function tests  Nausea   80 yo female with report of near-syncope today.  Currently alert and without pain  Consider dehydration vs ACS.  EKG, CBC, Troponin, PT-INR, CXR without significant abnormality.  CMP: K:3.3, BUN and Creatinine: 35 and 1.26, AST, ALT and Alk Phos elevated.    500 NS IVF bolus  given and Po potassium and Zofran for nausea  Pt admitted to hospitalist service  Case discussed with  Dr. Ashok Cordia. Filed Vitals:   05/28/14 1600 05/28/14 1613 05/28/14 1630 05/28/14 1700  BP: 143/69  143/55 128/61  Pulse: 63  62 58  Temp:      TempSrc:      Resp: $Remo'13  11 12  'ZRVmm$ Height:      Weight:      SpO2: 92% 95% 100% 97%   Meds given in ED:  Medications  sodium chloride 0.9 % bolus 500 mL (500 mLs Intravenous New Bag/Given 05/28/14 1820)  potassium chloride SA (K-DUR,KLOR-CON) CR tablet 40 mEq (40 mEq Oral Given 05/28/14 1829)  ondansetron (ZOFRAN) injection 4 mg (4 mg Intravenous Given 05/28/14 1829)    New Prescriptions   No medications on file        Britt Bottom, NP 06/01/14 0023

## 2014-05-28 NOTE — ED Notes (Signed)
Attempted report x1. 

## 2014-05-29 ENCOUNTER — Encounter (HOSPITAL_COMMUNITY): Payer: Self-pay | Admitting: Physician Assistant

## 2014-05-29 ENCOUNTER — Ambulatory Visit: Payer: Self-pay | Admitting: Physician Assistant

## 2014-05-29 DIAGNOSIS — I359 Nonrheumatic aortic valve disorder, unspecified: Secondary | ICD-10-CM

## 2014-05-29 DIAGNOSIS — I495 Sick sinus syndrome: Secondary | ICD-10-CM

## 2014-05-29 LAB — BASIC METABOLIC PANEL
ANION GAP: 12 (ref 5–15)
BUN: 35 mg/dL — ABNORMAL HIGH (ref 6–23)
CALCIUM: 8.2 mg/dL — AB (ref 8.4–10.5)
CO2: 26 mEq/L (ref 19–32)
CREATININE: 1.41 mg/dL — AB (ref 0.50–1.10)
Chloride: 106 mEq/L (ref 96–112)
GFR calc Af Amer: 40 mL/min — ABNORMAL LOW (ref >90)
GFR, EST NON AFRICAN AMERICAN: 35 mL/min — AB (ref >90)
Glucose, Bld: 84 mg/dL (ref 70–99)
Potassium: 3.6 mEq/L — ABNORMAL LOW (ref 3.7–5.3)
Sodium: 144 mEq/L (ref 137–147)

## 2014-05-29 LAB — CBC
HCT: 39.6 % (ref 36.0–46.0)
Hemoglobin: 13.2 g/dL (ref 12.0–15.0)
MCH: 30.6 pg (ref 26.0–34.0)
MCHC: 33.3 g/dL (ref 30.0–36.0)
MCV: 91.9 fL (ref 78.0–100.0)
Platelets: 80 10*3/uL — ABNORMAL LOW (ref 150–400)
RBC: 4.31 MIL/uL (ref 3.87–5.11)
RDW: 13.9 % (ref 11.5–15.5)
WBC: 6.1 10*3/uL (ref 4.0–10.5)

## 2014-05-29 LAB — HEPATITIS PANEL, ACUTE
HCV AB: REACTIVE — AB
HEP A IGM: NONREACTIVE
Hep B C IgM: NONREACTIVE
Hepatitis B Surface Ag: NEGATIVE

## 2014-05-29 LAB — TROPONIN I

## 2014-05-29 MED ORDER — POTASSIUM CHLORIDE CRYS ER 20 MEQ PO TBCR
40.0000 meq | EXTENDED_RELEASE_TABLET | Freq: Once | ORAL | Status: AC
  Administered 2014-05-29: 40 meq via ORAL
  Filled 2014-05-29: qty 2

## 2014-05-29 MED ORDER — SODIUM CHLORIDE 0.9 % IV SOLN: 250.0000 mL | INTRAVENOUS | Status: DC | PRN

## 2014-05-29 MED ORDER — INFLUENZA VAC SPLIT QUAD 0.5 ML IM SUSY
0.5000 mL | PREFILLED_SYRINGE | INTRAMUSCULAR | Status: AC
  Administered 2014-05-31: 0.5 mL via INTRAMUSCULAR
  Filled 2014-05-29: qty 0.5

## 2014-05-29 MED ORDER — AMIODARONE HCL 200 MG PO TABS
200.0000 mg | ORAL_TABLET | Freq: Every day | ORAL | Status: DC
  Administered 2014-05-29 – 2014-05-30 (×2): 200 mg via ORAL
  Filled 2014-05-29 (×3): qty 1

## 2014-05-29 MED ORDER — ATENOLOL 50 MG PO TABS
50.0000 mg | ORAL_TABLET | Freq: Two times a day (BID) | ORAL | Status: DC
Start: 2014-05-29 — End: 2014-06-01
  Administered 2014-05-29 – 2014-06-01 (×5): 50 mg via ORAL
  Filled 2014-05-29 (×7): qty 1

## 2014-05-29 NOTE — Progress Notes (Signed)
Verified with Dr. Venetia Constable it is ok to give patient amiodarone with elevated liver enzymes, MD stated ok to give.  Will continue to monitor.  [name]

## 2014-05-29 NOTE — Progress Notes (Signed)
-   I agree with plan and data as above. - due to overmedications, she is on lasix and HCTZ, will hold both. - can go home on HCTZ and ACE-I. - recheck orthostatics in am.

## 2014-05-29 NOTE — Progress Notes (Signed)
  Echocardiogram 2D Echocardiogram has been performed.  Donata Clay 05/29/2014, 3:35 PM

## 2014-05-29 NOTE — Progress Notes (Signed)
UR completed 

## 2014-05-29 NOTE — Discharge Instructions (Signed)

## 2014-05-29 NOTE — Progress Notes (Signed)
[facility] PROGRESS NOTE  [name] GQB:[id number] DOB: 03-23-1934 DOA: 05/28/2014 PCP: [name], MD  Assessment/Plan:  Syncope -Likely due to orthostatic hypotension secondary from BP medications- patient on Atenolol and Losartan-HCTZ, and reports low BPs during episodes of syncope -Orthostatic BP is positive for orthostatic hypotension -Held BP medications -CT head without acute pathology -UA- normal; CXR- no acute changes.  Negative cardiac markers  Elevated LFTs -Presented with RUQ abdominal pain, nausea -elevated LFTs:  AST 194, ALT 121, Alk Phos 194 -Lipase- WNL -Abd [place]- Dilated common bile duct at 14 mm, hepatic steatosis or other diffuse hepatocellular disease. -Hepatitis Panel with reactive HCV ab -Continue to monitor BMET  -nausea med PRN  Atrial Fibrillation -rate controlled -INR elevated at 2.56 -Warfarin- dosing per pharmacy  Renal Insufficiency -Creatinine 1.41, downtrending -continue IV hydration -continue to monitor BMET  Hypokalemia -KDUR 40 meq once -follow BMET  Tachycardia-bradycardia syndrome -s/p pacemaker  Moderate Aortic Stenosis  Hyperlipidemia -stopped statin due to elevated LFTs  Post OP infection -recent right knee replacement with post op infection, treated chronically with abx -continue Minocycline 100 mg PO BID   DVT Prophylaxis Warfarin per pharmacy Code Status: Full Family Communication: Daughter at bedside Disposition Plan: Inpatient   Consultants:  None  Procedures:  None  Antibiotics:  Minocycline- on chronic antibiotic for post op infection   HPI/Subjective: 80 yo female with PMH of Tachycardia-bradycardia syndrome s/p pacemaker, Atrial fibrillation, CAD, gastroparesis, chronic diarrhea, moderate aortic stenosis, recent knee replacement with chronic post op infection, and history of uterine and kidney cancer,  who presented after syncopal episode today. She was getting up and walking  when she got very dizzy and passed out to the floor. Family stated dizzy spells have been going on for months,and have been mentioned to cardiologist and pcp. Family stated BP checked during these episodes is usually really low. She denies symptoms right now, no sob, no cp no recent fevers. Patient had recent knee surgery with post op infection, for which she is on chronic antibiotics.  Patient admitted for further workup  Objective: Filed Vitals:   05/29/14 1002  BP: 139/63  Pulse: 65  Temp: 98.3 F (36.8 C)  Resp:     Intake/Output Summary (Last 24 hours) at 05/29/14 1254 Last data filed at 05/28/14 2319  Gross per 24 hour  Intake      3 ml  Output      0 ml  Net      3 ml   Filed Weights   05/28/14 1538 05/28/14 2034  Weight: 63.504 kg (140 lb) 68.539 kg (151 lb 1.6 oz)    Exam:  Gen: Alert and oriented Caucasian female in NAD HEENT: Normocephalic, atraumatic.  Pupils symmertrical.  Moist mucosa.   Chest: clear to auscultate bilaterally, no ronchi or rales  Cardiac: Regular rate and rhythm, S1-S2, no rubs murmurs or gallops  Abdomen: soft,  non distended, +bowel sounds. Mild tenderness in RUQ.  No guarding or rigidity  Extremities: Symmetrical in appearance without cyanosis. Right leg- with mild swelling and chronic skin changes.   Neurological: Alert awake oriented to time place and person.  Psychiatric: Appears normal.   Data Reviewed: Basic Metabolic Panel:  Recent Labs Lab 05/28/14 1621 05/29/14 0313  NA 141 144  K 3.3* 3.6*  CL 101 106  CO2 30 26  GLUCOSE 82 84  BUN 35* 35*  CREATININE 1.26* 1.41*  CALCIUM 8.5 8.2*   Liver Function Tests:  Recent Labs Lab 05/28/14 1621  AST 194*  ALT 121*  ALKPHOS 194*  BILITOT 0.7  PROT 6.2  ALBUMIN 2.2*    Recent Labs Lab 05/28/14 2133  LIPASE 37   No results found for this basename: AMMONIA,  in the last 168 hours CBC:  Recent Labs Lab 05/28/14 1621 05/29/14 0313  WBC 6.2 6.1  NEUTROABS 3.5  --    HGB 14.2 13.2  HCT 41.9 39.6  MCV 91.3 91.9  PLT 93* 80*   Cardiac Enzymes:  Recent Labs Lab 05/28/14 1622 05/28/14 2133 05/29/14 0313 05/29/14 0940  TROPONINI <0.30 <0.30 <0.30 <0.30   BNP (last 3 results) No results found for this basename: PROBNP,  in the last 8760 hours CBG:  Recent Labs Lab 05/28/14 1640  GLUCAP 84    No results found for this or any previous visit (from the past 240 hour(s)).   Studies: Dg Chest 2 View  05/28/2014   CLINICAL DATA:  Syncope, shortness of breath, history coronary artery disease, hypertension  EXAM: CHEST  2 VIEW  COMPARISON:  12/18/2013  FINDINGS: RIGHT subclavian transvenous pacemaker leads project at RIGHT atrium and RIGHT ventricle.  Upper normal heart size.  Atherosclerotic calcification aorta.  Mediastinal contours pulmonary vascularity normal.  Emphysematous and bronchitic changes consistent with COPD.  Lungs clear.  No pleural effusion or pneumothorax.  Bones demineralized.  IMPRESSION: Pacemaker.  COPD changes.  No acute abnormalities.   Electronically Signed   By: Lavonia Dana M.D.   On: 05/28/2014 17:57   Ct Head Wo Contrast  05/28/2014   CLINICAL DATA:  Near syncopal episode  EXAM: CT HEAD WITHOUT CONTRAST  TECHNIQUE: Contiguous axial images were obtained from the base of the skull through the vertex without intravenous contrast.  COMPARISON:  MRI brain 06/01/2004  FINDINGS: There is no evidence of mass effect, midline shift, or extra-axial fluid collections. There is no evidence of a space-occupying lesion or intracranial hemorrhage. There is no evidence of a cortical-based area of acute infarction. There is generalized cerebral atrophy. There is periventricular white matter low attenuation likely secondary to microangiopathy.  The ventricles and sulci are appropriate for the patient's age. The basal cisterns are patent.  Visualized portions of the orbits are unremarkable. The visualized portions of the paranasal sinuses and mastoid  air cells are unremarkable. Cerebrovascular atherosclerotic calcifications are noted.  The osseous structures are unremarkable.  IMPRESSION: No acute intracranial pathology.   Electronically Signed   By: Kathreen Devoid   On: 05/28/2014 19:28   US Abdomen Complete  05/29/2014   CLINICAL DATA:  Elevated liver function tests.  EXAM: ULTRASOUND ABDOMEN COMPLETE  COMPARISON:  07/17/2012 CT  FINDINGS: Gallbladder:  Surgically absent  Common bile duct:  Diameter: 14 mm.  Where visualized, no filling defect.  Liver:  Diffuse increased echogenicity. No evidence of focal lesion. Antegrade flow in the imaged portal venous system.  IVC:  No abnormality visualized.  Pancreas:  No evidence of ductal enlargement.  No visible mass or inflammation.  Spleen:  Size and appearance within normal limits.  Right Kidney:  Length: 10 cm. Echogenicity within normal limits. No mass or hydronephrosis visualized.  Left Kidney:  Surgically absent  Abdominal aorta:  Echogenic wall consistent with atherosclerosis. No evidence of aneurysm.  Other findings:  None.  IMPRESSION: 1. Dilated common bile duct at 14 mm. Although this could be related to remote cholecystectomy, correlate with liver function tests to exclude biliary obstruction. 2. Hepatic steatosis or other diffuse hepatocellular disease. 3. Left nephrectomy.   Electronically  Signed   By: Jorje Guild M.D.   On: 05/29/2014 03:02    Scheduled Meds: . atorvastatin  80 mg Oral Daily  . furosemide  20 mg Oral Daily  . [START ON 05/30/2014] Influenza vac split quadrivalent PF  0.5 mL Intramuscular Tomorrow-1000  . levothyroxine  100 mcg Oral QAC breakfast  . minocycline  100 mg Oral BID  . sodium chloride  3 mL Intravenous Q12H  . sodium chloride  3 mL Intravenous Q12H  . warfarin  1 mg Oral Once per day on Sun Mon Tue Wed Thu Fri  . [START ON 06/01/2014] warfarin  2 mg Oral Once per day on Sat  . Warfarin - Physician Dosing Inpatient   Does not apply q1800   Continuous  Infusions:   Principal Problem:   Syncope Active Problems:   ANXIETY   Gastroparesis   Atrial fibrillation   Tachycardia-bradycardia syndrome   S/P repair of patent ductus arteriosus   Aortic stenosis, moderate   Cardiac pacemaker (06/2010)   Hyperlipemia   Encounter for long-term (current) use of other medications   Elevated LFTs   Renal insufficiency, mild    Time spent: [place], [place] [facility]  [facility] Pager [phone]. If 7PM-7AM, please contact night-coverage at [email or web address], password [facility] 05/29/2014, 12:54 PM  LOS: 1 day

## 2014-05-30 ENCOUNTER — Encounter (HOSPITAL_COMMUNITY): Payer: Self-pay | Admitting: Physician Assistant

## 2014-05-30 DIAGNOSIS — I119 Hypertensive heart disease without heart failure: Secondary | ICD-10-CM

## 2014-05-30 DIAGNOSIS — R7989 Other specified abnormal findings of blood chemistry: Secondary | ICD-10-CM

## 2014-05-30 LAB — BASIC METABOLIC PANEL
Anion gap: 10 (ref 5–15)
BUN: 34 mg/dL — ABNORMAL HIGH (ref 6–23)
CALCIUM: 8.3 mg/dL — AB (ref 8.4–10.5)
CO2: 27 mEq/L (ref 19–32)
Chloride: 105 mEq/L (ref 96–112)
Creatinine, Ser: 1.3 mg/dL — ABNORMAL HIGH (ref 0.50–1.10)
GFR calc non Af Amer: 38 mL/min — ABNORMAL LOW (ref >90)
GFR, EST AFRICAN AMERICAN: 44 mL/min — AB (ref >90)
Glucose, Bld: 106 mg/dL — ABNORMAL HIGH (ref 70–99)
POTASSIUM: 4 meq/L (ref 3.7–5.3)
Sodium: 142 mEq/L (ref 137–147)

## 2014-05-30 LAB — PROTIME-INR
INR: 2.62 — AB (ref 0.00–1.49)
PROTHROMBIN TIME: 28 s — AB (ref 11.6–15.2)

## 2014-05-30 LAB — OVA AND PARASITE EXAMINATION: OVA AND PARASITES: NONE SEEN

## 2014-05-30 MED ORDER — SERTRALINE HCL 50 MG PO TABS
50.0000 mg | ORAL_TABLET | Freq: Every day | ORAL | Status: DC
Start: 2014-05-30 — End: 2014-06-01
  Administered 2014-05-30 – 2014-05-31 (×2): 50 mg via ORAL
  Filled 2014-05-30 (×3): qty 1

## 2014-05-30 MED ORDER — SODIUM CHLORIDE 0.9 % IV BOLUS (SEPSIS)
1000.0000 mL | Freq: Once | INTRAVENOUS | Status: AC
Start: 2014-05-30 — End: 2014-05-30
  Administered 2014-05-30: 1000 mL via INTRAVENOUS

## 2014-05-30 MED ORDER — PREGABALIN 75 MG PO CAPS
75.0000 mg | ORAL_CAPSULE | Freq: Two times a day (BID) | ORAL | Status: DC
  Administered 2014-05-30 – 2014-06-01 (×4): 75 mg via ORAL
  Filled 2014-05-30 (×4): qty 1

## 2014-05-30 MED ORDER — MECLIZINE HCL 25 MG PO TABS
25.0000 mg | ORAL_TABLET | ORAL | Status: DC | PRN
  Filled 2014-05-30: qty 1

## 2014-05-30 NOTE — Consult Note (Signed)
[facility] Consult: 3:15 PM 05/30/2014  LOS: 2 days    Referring Provider: Dr Grandville Silos  Primary Care Physician:  [name], MD Primary Gastroenterologist:  Dr. Deatra Ina    Reason for Consultation:  RUQ pain and nausea   HPI: [name] is a 80 y.o. female. Hx tachy brady syndrome s/p pacemaker, CAD.  Hx gastroparesis (abnormal GES in 2008), adenomatous gastric polyps, chronic diarrhea (diverticulosis on colonoscopy 2012).  Carries dx of previous anal fissures and radiation proctitis.  Moderate aortic stenosis, recent infected knee after fixation of ruptured tendon for which she is on long term oral abx. Hx renal cell/left nephrectomy and uterine cancer/hysterectomy and radiation. S/p repair of Aortoiliac vascular disease on CT scan 2013. S/p repair of patent ductus arteriosis age 73 (1947) per chart. Chronic Coumadin for atrial fibrillation.   Admitted after syncopal spell. Has been dizzy for several months and PMD as well as cardiology have evaluated her. The syncope is attributed to orthostasis in setting of multiple anti-hypertensives.  LFTs elevated. Ultrasound shows 14 mm CBD without obvious stone or obstruction. Fatty liver or "other diffuse hepatocellular disease" also noted.  Acute viral hepatitis screen turned up positive HCV AB  Has had several months of early satiety leading to nausea but no emesis.   Intermittent sxs at first but over weeks accelerated to daily and even occasional nocturnal nausea.  Also having dull aching pain/discomfort in RUQ that radiates into the back/scapular region. Pain has not been severe and is independent of the N/V.   Ct scan abdomen/pelvis at outside facility  Chronic diarrhea for decades, 4 to 10 loose BMs daily, ever since radiation seeds implanted to  pelvis.  This is stable.  No weight loss, no pruritus, no IVDA or ilicit drug use, no blood transfusions.  No ETOH.  No amber colored urine.  No jaundice.   Dr Deatra Ina has trialed course of Xifaxan , for possible bacterial overgrowth in 06/2013.  It seemed to help her diarrhea.  Questran discontinued due to increased gas and bloating.  On CT abdomen /pelvis in 07/2012 there was no fatty liver or biliary tree/CBD abnormality noted.  CT in 2007 did mention fatty liver and "mild" CBD dilatation.     Past Medical History  Diagnosis Date  . PAF (paroxysmal atrial fibrillation)     controlled with amiodarone, on coumadin  . CAD (coronary artery disease)   . Aortic root dilatation   . Moderate aortic stenosis   . Chronic anticoagulation   . Tachycardia-bradycardia syndrome     s/p PPM by Dr Doreatha Lew ([facility]) 07/03/10  . GERD (gastroesophageal reflux disease)   . Chronic kidney disease   . Pacemaker     2012  . Neuropathy     feet/legs  . Restless leg syndrome   . Headache(784.0)     hx migraines - takes Zoloft for migraines  . History of uterine cancer   . Cancer     hx Kidney cancer / hx of endometrial cancer  . PONV (postoperative nausea and vomiting)   . Anxiety   .  Heart murmur   . HTN (hypertension)   . Hyperlipemia   . Hypothyroidism   . 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 Osteomyelitis as child  . Family history of anesthesia complication     daughter has ponv  . Syncope 05/28/2014    Past Surgical History  Procedure Laterality Date  . Cardiac catheterization  2008  . Nephrectomy Left     renal cell cancer  . Total abdominal hysterectomy    . Patent ductus arterious repair  age 77  . Ptvp    . Pacemaker insertion  07/04/11    by Dr Doreatha Lew  . Cholecystectomy    . Upper gastrointestinal endoscopy    . Total knee arthroplasty  02/07/2012    Procedure: TOTAL KNEE ARTHROPLASTY;  Surgeon: Mauri Pole, MD;  Location: [facility];  Service: Orthopedics;  Laterality: Right;  . Insert /  replace / remove pacemaker    . Joint replacement      rt total hip 2009  . I&d knee with poly exchange Right 12/17/2013    Procedure: IRRIGATION AND DEBRIDEMEN RIGHT TOTAL  KNEE WITH POLY EXCHANGE;  Surgeon: Mauri Pole, MD;  Location: [facility];  Service: Orthopedics;  Laterality: Right;  . Patellar tendon repair Right 03/06/2014    Procedure: AVULSION WITH PATELLA TENDON REPAIR;  Surgeon: Mauri Pole, MD;  Location: [facility];  Service: Orthopedics;  Laterality: Right;    Prior to Admission medications   Medication Sig Start Date End Date Taking? Authorizing Provider  acetaminophen (TYLENOL) 500 MG tablet Take 1,000 mg by mouth every 6 (six) hours as needed for moderate pain.   Yes Historical Provider, MD  ALPRAZolam Duanne Moron) 0.5 MG tablet Take 0.5 mg by mouth daily as needed for anxiety.   Yes Historical Provider, MD  amiodarone (PACERONE) 200 MG tablet Take 200 mg by mouth daily.   Yes Historical Provider, MD  atenolol (TENORMIN) 100 MG tablet Take 50 mg by mouth 2 (two) times daily.    Yes Historical Provider, MD  atorvastatin (LIPITOR) 80 MG tablet Take 80 mg by mouth daily.   Yes Historical Provider, MD  Biotin 5000 MCG CAPS Take 5,000 mcg by mouth 2 (two) times daily.   Yes Historical Provider, MD  Cholecalciferol (VITAMIN D3) 2000 UNITS TABS Take 2,000 Units by mouth 2 (two) times daily.    Yes Historical Provider, MD  furosemide (LASIX) 40 MG tablet Take 20 mg by mouth daily.    Yes Historical Provider, MD  levothyroxine (SYNTHROID, LEVOTHROID) 100 MCG tablet Take 100 mcg by mouth daily before breakfast.   Yes Historical Provider, MD  losartan-hydrochlorothiazide (HYZAAR) 100-25 MG per tablet Take 0.5 tablets by mouth every morning.   Yes Historical Provider, MD  Magnesium 250 MG TABS Take 250 mg by mouth daily.    Yes Historical Provider, MD  meclizine (ANTIVERT) 25 MG tablet Take 25 mg by mouth as needed for dizziness or nausea. 02/26/14 02/26/15 Yes [name], MD  methocarbamol  (ROBAXIN) 500 MG tablet Take 500 mg by mouth every 6 (six) hours as needed for muscle spasms.   Yes Historical Provider, MD  minocycline (MINOCIN,DYNACIN) 100 MG capsule Take 100 mg by mouth 2 (two) times daily. Long-term antibiotic per Dr. Aurea Graff office for knee infection 02/06/14  Yes Historical Provider, MD  nitroGLYCERIN (NITRODUR - DOSED IN MG/24 HR) 0.4 mg/hr patch Place 0.4 mg onto the skin daily as needed (chest pain).   Yes Historical Provider, MD  nitroGLYCERIN (NITROSTAT) 0.4 MG  SL tablet Place 0.4 mg under the tongue every 5 (five) minutes as needed for chest pain.   Yes Historical Provider, MD  potassium chloride SA (K-DUR,KLOR-CON) 20 MEQ tablet Take 20 mEq by mouth 3 (three) times daily. 08/29/12  Yes Eugenie Filler, MD  pregabalin (LYRICA) 75 MG capsule Take 75 mg by mouth 2 (two) times daily.   Yes Historical Provider, MD  ranitidine (ZANTAC) 150 MG tablet Take 150 mg by mouth 2 (two) times daily.    Yes Historical Provider, MD  sertraline (ZOLOFT) 50 MG tablet Take 50 mg by mouth daily.   Yes Historical Provider, MD  traMADol (ULTRAM) 50 MG tablet Take 50 mg by mouth every 6 (six) hours as needed for moderate pain.    Yes Historical Provider, MD  warfarin (COUMADIN) 2 MG tablet Take 1-2 mg by mouth daily. 1 mg daily except 2 mg on Saturdays per coag clinic;   Yes Historical Provider, MD    Scheduled Meds: . amiodarone  200 mg Oral Daily  . atenolol  50 mg Oral BID  . Influenza vac split quadrivalent PF  0.5 mL Intramuscular Tomorrow-1000  . levothyroxine  100 mcg Oral QAC breakfast  . minocycline  100 mg Oral BID  . warfarin  1 mg Oral Once per day on Sun Mon Tue Wed Thu Fri  . [START ON 06/01/2014] warfarin  2 mg Oral Once per day on Sat  . Warfarin - Physician Dosing Inpatient   Does not apply q1800   Infusions:   PRN Meds: alum & mag hydroxide-simeth, ondansetron (ZOFRAN) IV, ondansetron   Allergies as of 05/28/2014 - Review Complete 05/28/2014  Allergen Reaction  Noted  . Codeine Nausea And Vomiting   . Hydrocodone Other (See Comments) 03/07/2014  . Morphine and related Nausea And Vomiting 12/13/2013  . Requip [ropinirole hcl]  07/17/2013  . Zinc  04/22/2011    Family History  Problem Relation Age of Onset  . Colon cancer Mother 3  . Heart disease Mother   . Heart disease Father   . Heart attack Father   . Heart disease Maternal Grandmother     History   Social History  . Marital Status: Married    Spouse Name: N/A    Number of Children: N/A  . Years of Education: N/A   Occupational History  . Not on file.   Social History Main Topics  . Smoking status: Never Smoker   . Smokeless tobacco: Never Used  . Alcohol Use: No  . Drug Use: No  . Sexual Activity: No   Other Topics Concern  . Not on file   Social History Narrative   Lives in [place].  Continues to work.    REVIEW OF SYSTEMS: Constitutional:  No weight loss. No new weaknesss.  ENT:  No nose bleeds Pulm:  No SOB or cough CV:  No palpitations, no LE edema.  GU:  No hematuria, no frequency GI:  Per HPI. No dysphagia.  Heme:  No    Transfusions:  Family and pt say no.  Neuro:  No headaches, no peripheral tingling or numbness Derm:  No itching, no rash or sores.  Endocrine:  No sweats or chills.  No polyuria or dysuria Immunization:  Not queried.  Travel:  None beyond local counties in last few months.    PHYSICAL EXAM: Vital signs in last 24 hours: Filed Vitals:   05/30/14 1419  BP: 138/46  Pulse: 72  Temp: 98.3 F (36.8 C)  Resp: 15   Wt Readings from Last 3 Encounters:  05/30/14 68.765 kg (151 lb 9.6 oz)  05/06/14 67.132 kg (148 lb)  03/03/14 60.782 kg (134 lb)    General: pleasant, well appearing elderly WF.  Not particularly rail, not jaundiced.  Head:  Symmetric, no trauma or swelling  Eyes:  No icterus or pallor Ears:  Slightly HOPH  Nose:  No discharge Mouth:  Clear, moist MM.  Upper denture plate, lower teeth in fair  repair Neck:  No mass or JVD Lungs:  Clear bil.  No cough or labored resps.  Pacemaker is on the right.  Heart: RRR.  No mrg Abdomen:  Soft, NT, ND.  No mass or HSM.  No bruits.  Active BS.   Rectal: deferred   Musc/Skeltl: no joint swelling. No deformity .  Left knee/leg is in knee immoblizer.  Left foot dorsum is charcoal colored.  Extremities:  Charcoal discoloration of top of left foot and ankle.  No pedal edema.   Neurologic:  Oriented x 3.  No tremor, no asterixis.  Good historian Skin:  No rash or sores.  No telangectasia.  Tattoos:  none Nodes:  No cervical adenopathy.    Psych:  Pleasant, relaxed,   Intake/Output from previous day: 09/23 0701 - 09/24 0700 In: 720 [P.O.:720] Out: -  Intake/Output this shift:    LAB RESULTS:  Recent Labs  05/28/14 1621 05/29/14 0313  WBC 6.2 6.1  HGB 14.2 13.2  HCT 41.9 39.6  PLT 93* 80*  MCV    91  BMET Lab Results  Component Value Date   NA 142 05/30/2014   NA 144 05/29/2014   NA 141 05/28/2014   K 4.0 05/30/2014   K 3.6* 05/29/2014   K 3.3* 05/28/2014   CL 105 05/30/2014   CL 106 05/29/2014   CL 101 05/28/2014   CO2 27 05/30/2014   CO2 26 05/29/2014   CO2 30 05/28/2014   GLUCOSE 106* 05/30/2014   GLUCOSE 84 05/29/2014   GLUCOSE 82 05/28/2014   BUN 34* 05/30/2014   BUN 35* 05/29/2014   BUN 35* 05/28/2014   CREATININE 1.30* 05/30/2014   CREATININE 1.41* 05/29/2014   CREATININE 1.26* 05/28/2014   CALCIUM 8.3* 05/30/2014   CALCIUM 8.2* 05/29/2014   CALCIUM 8.5 05/28/2014   LFT  Recent Labs  05/28/14 1621  PROT 6.2  ALBUMIN 2.2*  AST 194*  ALT 121*  ALKPHOS 194*  BILITOT 0.7   Lipase     Component Value Date/Time   LIPASE 37 05/28/2014 2133    PT/INR Lab Results  Component Value Date   INR 2.62* 05/30/2014   INR 2.56* 05/28/2014   INR 3.2 05/28/2014   Hepatitis Panel  Recent Labs  05/28/14 2133  HEPBSAG NEGATIVE  HCVAB Reactive*  HEPAIGM NON REACTIVE  HEPBIGM NON REACTIVE   C-Diff No components found with this  basename: cdiff   Lipase     Component Value Date/Time   LIPASE 37 05/28/2014 2133    Drugs of Abuse  No results found for this basename: labopia, cocainscrnur, labbenz, amphetmu, thcu, labbarb     RADIOLOGY STUDIES: Dg Chest 2 View 05/28/2014   CLINICAL DATA:  Syncope, shortness of breath, history coronary artery disease, hypertension  EXAM: CHEST  2 VIEW  COMPARISON:  12/18/2013  FINDINGS: RIGHT subclavian transvenous pacemaker leads project at RIGHT atrium and RIGHT ventricle.  Upper normal heart size.  Atherosclerotic calcification aorta.  Mediastinal contours pulmonary vascularity normal.  Emphysematous and bronchitic  changes consistent with COPD.  Lungs clear.  No pleural effusion or pneumothorax.  Bones demineralized.  IMPRESSION: Pacemaker.  COPD changes.  No acute abnormalities.   Electronically Signed   By: Lavonia Dana M.D.   On: 05/28/2014 17:57   Ct Head Wo Contrast 05/28/2014   CLINICAL DATA:  Near syncopal episode  EXAM: CT HEAD WITHOUT CONTRAST  TECHNIQUE: Contiguous axial images were obtained from the base of the skull through the vertex without intravenous contrast.  COMPARISON:  MRI brain 06/01/2004  FINDINGS: There is no evidence of mass effect, midline shift, or extra-axial fluid collections. There is no evidence of a space-occupying lesion or intracranial hemorrhage. There is no evidence of a cortical-based area of acute infarction. There is generalized cerebral atrophy. There is periventricular white matter low attenuation likely secondary to microangiopathy.  The ventricles and sulci are appropriate for the patient's age. The basal cisterns are patent.  Visualized portions of the orbits are unremarkable. The visualized portions of the paranasal sinuses and mastoid air cells are unremarkable. Cerebrovascular atherosclerotic calcifications are noted.  The osseous structures are unremarkable.  IMPRESSION: No acute intracranial pathology.   Electronically Signed   By: Kathreen Devoid    On: 05/28/2014 19:28   US Abdomen Complete 05/29/2014   CLINICAL DATA:  Elevated liver function tests.  EXAM: ULTRASOUND ABDOMEN COMPLETE  COMPARISON:  07/17/2012 CT  FINDINGS: Gallbladder:  Surgically absent  Common bile duct:  Diameter: 14 mm.  Where visualized, no filling defect.  Liver:  Diffuse increased echogenicity. No evidence of focal lesion. Antegrade flow in the imaged portal venous system.  IVC:  No abnormality visualized.  Pancreas:  No evidence of ductal enlargement.  No visible mass or inflammation.  Spleen:  Size and appearance within normal limits.  Right Kidney:  Length: 10 cm. Echogenicity within normal limits. No mass or hydronephrosis visualized.  Left Kidney:  Surgically absent  Abdominal aorta:  Echogenic wall consistent with atherosclerosis. No evidence of aneurysm.  Other findings:  None.  IMPRESSION: 1. Dilated common bile duct at 14 mm. Although this could be related to remote cholecystectomy, correlate with liver function tests to exclude biliary obstruction. 2. Hepatic steatosis or other diffuse hepatocellular disease. 3. Left nephrectomy.   Electronically Signed   By: Jorje Guild M.D.   On: 05/29/2014 03:02    ENDOSCOPIC STUDIES: 08/2011 Colonoscopy  For family hx colon cancer in mom age 91  Diverticula in descending colon otherwise normal.  Previous colonoscopies 2002, 2007 also normal.   01/2009 EGD  for hx adenomatous gastric polyp  Normal.    IMPRESSION:   * RUQ pain, nausea, early satiety. Dilated CBD without obvious obstructing stone or mass. Elevated LFTs. Previous cholecystectomy.   * Syncope due to orthostasis in setting of multiple anti-hypertensives. Not recurrent. Meds have been adjusted.   * Hepatic steatosis. Hepatitis C Ab is positive.   * Hx renal cell cancer. S/p left nephrectomy.   *  Hx uterine cancer, remotely.  S/p hysto and external beam radiation and radiation seed implantation.   * Chronic Coumadin.  Therapeutic INR.   *   Thrombocytopenia    PLAN:     * MRCP not an option due to pacemaker.   * Ordered HCV RNA quant.     Azucena Freed  05/30/2014, 3:15 PM Pager: [phone]  ________________________________________________________________________  Velora Heckler GI MD note:  I personally examined the patient, reviewed the data and agree with the assessment and plan described above.  We had originally  considered MRI/MRCP for further evaluation of dilated bile duct, elevated liver tests but she cannot get one due to implanted pacemaker.  She had not had CT scan and I will arrange for that instead (today, Friday)   Owens Loffler, MD [facility] Pager [phone]

## 2014-05-30 NOTE — Progress Notes (Signed)
[facility] PROGRESS NOTE  [name] JHE:[id number] DOB: 1934-05-25 DOA: 05/28/2014 PCP: [name], MD  Assessment/Plan: #1 syncope Likely secondary to orthostasis. Patient noted to be orthostatic on admission. Likely secondary to antihypertensive medications. Patient on IV fluids. ARB and HCTZ on hold. Continue hydration. CT head negative. 2-D echo with mild aortic valvular stenosis and moderate regurgitation. Follow.  #2 right upper quadrant abdominal pain Abdominal ultrasound with a dilated common bile duct, however patient is status post cholecystectomy. Also also noted is hepatic steatosis. Patient with elevated LFT of AST/ALT.  HCV Ab reactive. Will consult with GI for further evaluation and management.  #3 elevated LFTs HCV antibody positive.  Check HCV RNA. Abdominal ultrasound with dilated common bile duct. Will repeat hepatic panel. Consult with GI.  #4 atrial fibrillation/tachybradycardia syndrome/status post PPM Currently rate controlled. INR 2.56. Coumadin for anticoagulation.  #5 chronic kidney disease/status post left nephrectomy Currently stable.  #6 hypokalemia Likely secondary to diuretics. Repleted.  #7 orthostatic hypotension Likely secondary to diuretics. ARB and HCTZ on hold. Given a fluid bolus. Gentle hydration. Follow.  #8 history of mild aortic valvular stenosis 2-D echo with no significant change follow.  #9 history of postop knee infection Continue home regimen of minocycline.   Code Status: Full Family Communication: Updated patient and daughters at bedside. Disposition Plan: Home when medically stable   Consultants:  None  Procedures:  2-D echo 05/29/2014  Abdominal ultrasound 05/28/2014  Chest x-ray 05/28/2014  CT head 05/28/2014  Antibiotics:  None  HPI/Subjective: Patient complaining of some nausea. Patient denies any emesis. Patient also complaining of early satiety.  Objective: Filed Vitals:    05/30/14 1419  BP: 138/46  Pulse: 72  Temp: 98.3 F (36.8 C)  Resp: 15    Intake/Output Summary (Last 24 hours) at 05/30/14 1522 Last data filed at 05/29/14 1530  Gross per 24 hour  Intake    240 ml  Output      0 ml  Net    240 ml   Filed Weights   05/28/14 2034 05/29/14 2128 05/30/14 0500  Weight: 68.539 kg (151 lb 1.6 oz) 69.174 kg (152 lb 8 oz) 68.765 kg (151 lb 9.6 oz)    Exam:   General:  NAD  Cardiovascular: RRR  Respiratory: CTAB  Abdomen: Soft, nondistended, positive bowel sounds, minimal to mild tenderness to palpation in the right upper quadrant  Musculoskeletal: No clubbing cyanosis or edema   Data Reviewed: Basic Metabolic Panel:  Recent Labs Lab 05/28/14 1621 05/29/14 0313 05/30/14 0920  NA 141 144 142  K 3.3* 3.6* 4.0  CL 101 106 105  CO2 30 26 27   GLUCOSE 82 84 106*  BUN 35* 35* 34*  CREATININE 1.26* 1.41* 1.30*  CALCIUM 8.5 8.2* 8.3*   Liver Function Tests:  Recent Labs Lab 05/28/14 1621  AST 194*  ALT 121*  ALKPHOS 194*  BILITOT 0.7  PROT 6.2  ALBUMIN 2.2*    Recent Labs Lab 05/28/14 2133  LIPASE 37   No results found for this basename: AMMONIA,  in the last 168 hours CBC:  Recent Labs Lab 05/28/14 1621 05/29/14 0313  WBC 6.2 6.1  NEUTROABS 3.5  --   HGB 14.2 13.2  HCT 41.9 39.6  MCV 91.3 91.9  PLT 93* 80*   Cardiac Enzymes:  Recent Labs Lab 05/28/14 1622 05/28/14 2133 05/29/14 0313 05/29/14 0940  TROPONINI <0.30 <0.30 <0.30 <0.30   BNP (last 3 results) No results found for this basename: PROBNP,  in the last 8760 hours CBG:  Recent Labs Lab 05/28/14 1640  GLUCAP 84    No results found for this or any previous visit (from the past 240 hour(s)).   Studies: Dg Chest 2 View  05/28/2014   CLINICAL DATA:  Syncope, shortness of breath, history coronary artery disease, hypertension  EXAM: CHEST  2 VIEW  COMPARISON:  12/18/2013  FINDINGS: RIGHT subclavian transvenous pacemaker leads project at RIGHT  atrium and RIGHT ventricle.  Upper normal heart size.  Atherosclerotic calcification aorta.  Mediastinal contours pulmonary vascularity normal.  Emphysematous and bronchitic changes consistent with COPD.  Lungs clear.  No pleural effusion or pneumothorax.  Bones demineralized.  IMPRESSION: Pacemaker.  COPD changes.  No acute abnormalities.   Electronically Signed   By: Lavonia Dana M.D.   On: 05/28/2014 17:57   Ct Head Wo Contrast  05/28/2014   CLINICAL DATA:  Near syncopal episode  EXAM: CT HEAD WITHOUT CONTRAST  TECHNIQUE: Contiguous axial images were obtained from the base of the skull through the vertex without intravenous contrast.  COMPARISON:  MRI brain 06/01/2004  FINDINGS: There is no evidence of mass effect, midline shift, or extra-axial fluid collections. There is no evidence of a space-occupying lesion or intracranial hemorrhage. There is no evidence of a cortical-based area of acute infarction. There is generalized cerebral atrophy. There is periventricular white matter low attenuation likely secondary to microangiopathy.  The ventricles and sulci are appropriate for the patient's age. The basal cisterns are patent.  Visualized portions of the orbits are unremarkable. The visualized portions of the paranasal sinuses and mastoid air cells are unremarkable. Cerebrovascular atherosclerotic calcifications are noted.  The osseous structures are unremarkable.  IMPRESSION: No acute intracranial pathology.   Electronically Signed   By: Kathreen Devoid   On: 05/28/2014 19:28   US Abdomen Complete  05/29/2014   CLINICAL DATA:  Elevated liver function tests.  EXAM: ULTRASOUND ABDOMEN COMPLETE  COMPARISON:  07/17/2012 CT  FINDINGS: Gallbladder:  Surgically absent  Common bile duct:  Diameter: 14 mm.  Where visualized, no filling defect.  Liver:  Diffuse increased echogenicity. No evidence of focal lesion. Antegrade flow in the imaged portal venous system.  IVC:  No abnormality visualized.  Pancreas:  No evidence  of ductal enlargement.  No visible mass or inflammation.  Spleen:  Size and appearance within normal limits.  Right Kidney:  Length: 10 cm. Echogenicity within normal limits. No mass or hydronephrosis visualized.  Left Kidney:  Surgically absent  Abdominal aorta:  Echogenic wall consistent with atherosclerosis. No evidence of aneurysm.  Other findings:  None.  IMPRESSION: 1. Dilated common bile duct at 14 mm. Although this could be related to remote cholecystectomy, correlate with liver function tests to exclude biliary obstruction. 2. Hepatic steatosis or other diffuse hepatocellular disease. 3. Left nephrectomy.   Electronically Signed   By: Jorje Guild M.D.   On: 05/29/2014 03:02    Scheduled Meds: . amiodarone  200 mg Oral Daily  . atenolol  50 mg Oral BID  . Influenza vac split quadrivalent PF  0.5 mL Intramuscular Tomorrow-1000  . levothyroxine  100 mcg Oral QAC breakfast  . minocycline  100 mg Oral BID  . warfarin  1 mg Oral Once per day on Sun Mon Tue Wed Thu Fri  . [START ON 06/01/2014] warfarin  2 mg Oral Once per day on Sat  . Warfarin - Physician Dosing Inpatient   Does not apply q1800   Continuous Infusions:   Principal  Problem:   Syncope Active Problems:   ANXIETY   Gastroparesis   Atrial fibrillation   Tachycardia-bradycardia syndrome   S/P repair of patent ductus arteriosus   Aortic stenosis, moderate   Cardiac pacemaker (06/2010)   Hyperlipemia   Encounter for long-term (current) use of other medications   Elevated LFTs   Renal insufficiency, mild   Post op infection    Time spent: 35 mins    [facility] MD [facility] Pager [phone]. If 7PM-7AM, please contact night-coverage at [email or web address], password [facility] 05/30/2014, 3:22 PM  LOS: 2 days

## 2014-05-31 ENCOUNTER — Other Ambulatory Visit: Payer: Self-pay | Admitting: Physician Assistant

## 2014-05-31 ENCOUNTER — Observation Stay (HOSPITAL_COMMUNITY): Payer: Medicare Other

## 2014-05-31 DIAGNOSIS — R7989 Other specified abnormal findings of blood chemistry: Secondary | ICD-10-CM

## 2014-05-31 DIAGNOSIS — K838 Other specified diseases of biliary tract: Secondary | ICD-10-CM

## 2014-05-31 DIAGNOSIS — R945 Abnormal results of liver function studies: Secondary | ICD-10-CM

## 2014-05-31 LAB — COMPREHENSIVE METABOLIC PANEL
ALK PHOS: 164 U/L — AB (ref 39–117)
ALT: 105 U/L — AB (ref 0–35)
ANION GAP: 10 (ref 5–15)
AST: 156 U/L — ABNORMAL HIGH (ref 0–37)
Albumin: 2 g/dL — ABNORMAL LOW (ref 3.5–5.2)
BUN: 29 mg/dL — AB (ref 6–23)
CALCIUM: 8.3 mg/dL — AB (ref 8.4–10.5)
CO2: 22 mEq/L (ref 19–32)
Chloride: 109 mEq/L (ref 96–112)
Creatinine, Ser: 1.14 mg/dL — ABNORMAL HIGH (ref 0.50–1.10)
GFR calc non Af Amer: 45 mL/min — ABNORMAL LOW (ref >90)
GFR, EST AFRICAN AMERICAN: 52 mL/min — AB (ref >90)
Glucose, Bld: 90 mg/dL (ref 70–99)
POTASSIUM: 4.5 meq/L (ref 3.7–5.3)
Sodium: 141 mEq/L (ref 137–147)
TOTAL PROTEIN: 5.6 g/dL — AB (ref 6.0–8.3)
Total Bilirubin: 0.8 mg/dL (ref 0.3–1.2)

## 2014-05-31 LAB — HEPATIC FUNCTION PANEL
ALT: 103 U/L — ABNORMAL HIGH (ref 0–35)
AST: 176 U/L — ABNORMAL HIGH (ref 0–37)
Albumin: 2 g/dL — ABNORMAL LOW (ref 3.5–5.2)
Alkaline Phosphatase: 165 U/L — ABNORMAL HIGH (ref 39–117)
BILIRUBIN INDIRECT: 0.5 mg/dL (ref 0.3–0.9)
Bilirubin, Direct: 0.2 mg/dL (ref 0.0–0.3)
Total Bilirubin: 0.7 mg/dL (ref 0.3–1.2)
Total Protein: 5.7 g/dL — ABNORMAL LOW (ref 6.0–8.3)

## 2014-05-31 LAB — CBC
HCT: 41.1 % (ref 36.0–46.0)
HEMOGLOBIN: 13.8 g/dL (ref 12.0–15.0)
MCH: 30.7 pg (ref 26.0–34.0)
MCHC: 33.6 g/dL (ref 30.0–36.0)
MCV: 91.5 fL (ref 78.0–100.0)
PLATELETS: 70 10*3/uL — AB (ref 150–400)
RBC: 4.49 MIL/uL (ref 3.87–5.11)
RDW: 13.9 % (ref 11.5–15.5)
WBC: 5.8 10*3/uL (ref 4.0–10.5)

## 2014-05-31 LAB — PROTIME-INR
INR: 2.48 — ABNORMAL HIGH (ref 0.00–1.49)
Prothrombin Time: 26.8 seconds — ABNORMAL HIGH (ref 11.6–15.2)

## 2014-05-31 MED ORDER — IOHEXOL 300 MG/ML  SOLN
100.0000 mL | Freq: Once | INTRAMUSCULAR | Status: AC | PRN
  Administered 2014-05-31: 80 mL via INTRAVENOUS

## 2014-05-31 MED ORDER — LOSARTAN POTASSIUM 50 MG PO TABS
50.0000 mg | ORAL_TABLET | Freq: Every day | ORAL | Status: DC
  Administered 2014-05-31 – 2014-06-01 (×2): 50 mg via ORAL
  Filled 2014-05-31 (×2): qty 1

## 2014-05-31 MED ORDER — IOHEXOL 300 MG/ML  SOLN
25.0000 mL | INTRAMUSCULAR | Status: AC
  Administered 2014-05-31: 25 mL via ORAL

## 2014-05-31 NOTE — Progress Notes (Signed)
[facility] Progress Note    Since last GI note: No clinical changes  Objective: Vital signs in last 24 hours: Temp:  [98 F (36.7 C)-98.3 F (36.8 C)] 98 F (36.7 C) (09/25 0604) Pulse Rate:  [63-72] 66 (09/25 0604) Resp:  [15-18] 18 (09/25 0604) BP: (138-181)/(46-55) 181/55 mmHg (09/25 0604) SpO2:  [96 %-98 %] 98 % (09/25 0604) Weight:  [149 lb 14.2 oz (67.988 kg)] 149 lb 14.2 oz (67.988 kg) (09/25 0604) Last BM Date: 05/29/14 General: alert and oriented times 3 Heart: regular rate and rythm Abdomen: soft, non-tender, non-distended, normal bowel sounds   Lab Results:  Recent Labs  05/28/14 1621 05/29/14 0313  WBC 6.2 6.1  HGB 14.2 13.2  PLT 93* 80*  MCV 91.3 91.9    Recent Labs  05/28/14 1621 05/29/14 0313 05/30/14 0920  NA 141 144 142  K 3.3* 3.6* 4.0  CL 101 106 105  CO2 30 26 27   GLUCOSE 82 84 106*  BUN 35* 35* 34*  CREATININE 1.26* 1.41* 1.30*  CALCIUM 8.5 8.2* 8.3*    Recent Labs  05/28/14 1621  PROT 6.2  ALBUMIN 2.2*  AST 194*  ALT 121*  ALKPHOS 194*  BILITOT 0.7    Recent Labs  05/28/14 1621 05/30/14 0554  INR 2.56* 2.62*     Studies/Results: No results found.   Medications: Scheduled Meds: . amiodarone  200 mg Oral Daily  . atenolol  50 mg Oral BID  . Influenza vac split quadrivalent PF  0.5 mL Intramuscular Tomorrow-1000  . levothyroxine  100 mcg Oral QAC breakfast  . minocycline  100 mg Oral BID  . pregabalin  75 mg Oral BID  . sertraline  50 mg Oral Daily  . warfarin  1 mg Oral Once per day on Sun Mon Tue Wed Thu Fri  . [START ON 06/01/2014] warfarin  2 mg Oral Once per day on Sat  . Warfarin - Physician Dosing Inpatient   Does not apply q1800   Continuous Infusions:  PRN Meds:.alum & mag hydroxide-simeth, meclizine, ondansetron (ZOFRAN) IV, ondansetron    Assessment/Plan: 80 y.o. female with dilated bile ducts, elevated liver tests  CT scan with thin cuts through pancreas today.    Milus Banister, MD  05/31/2014, 8:36 AM [facility] Pager [phone]

## 2014-05-31 NOTE — Progress Notes (Cosign Needed)
       05/31/2014, 4:03 PM  LOS: 3 days    Results of today's CT scan   Ct Abdomen Pelvis W Contrast  05/31/2014   CLINICAL DATA:  Elevated liver function tests, abdominal pain. Prior left nephrectomy, cholecystectomy. Dilated common bile duct at prior exam.  EXAM: CT ABDOMEN AND PELVIS WITH CONTRAST  TECHNIQUE: Multidetector CT imaging of the abdomen and pelvis was performed using the standard protocol following bolus administration of intravenous contrast.  CONTRAST:  45mL OMNIPAQUE IOHEXOL 300 MG/ML  SOLN  COMPARISON:  Abdominal ultrasound 05/28/2014, CT abdomen/ pelvis 07/17/2012 and 04/12/2011  FINDINGS: Trace pleural effusions are present with associated minimal compressive atelectasis. Calcified granuloma, left lung base image 15. Pacer leads partly visualized.  Cholecystectomy clips are noted. Fusiform mild prominence of the mid common duct measuring 1 cm maximally with tapering to the ampulla is identified. Compared to prior exams, there is no significant interval change. Allowing for CT technique, no filling defect is identified. No pancreatic ductal dilatation. Stable minimal prominence of the central hepatic ducts. Adrenal glands, spleen, pancreas, and right kidney are unremarkable. Left kidney surgically absent without mass in the left nephrectomy bed.  Moderate atheromatous aortic calcification is identified without aneurysm.  Pelvic sidewall clips are identified. No pelvic lymphadenopathy. Trace if any free pelvic fluid is identified, image 65, not well visualized due to streak artifact but likely physiologic. No bowel wall thickening or focal segmental dilatation.  Uterus presumed surgically absent. Neither ovary visualized. No adnexal mass.  No acute osseous abnormality. Degenerative changes are noted in the spine.  IMPRESSION: Similar mild prominence of the common duct with gradual tapering to the ampulla, likely a combination of  postcholecystectomy finding and patient age. No focal cut off or radiopaque filling defect identified. No acute intra-abdominal or pelvic pathology.   Electronically Signed   By: Conchita Paris M.D.   On: 05/31/2014 15:48    ASSESMENT:   *  Elevated LFTs and dilated bile ducts on imaging.  By CT this appears to be benign, post chole and age related finding on imaging. However her LFTs are up.   PLAN   *  Can discharge today.  Has GI ROV arranged with Dr Kelby Fam APP, [name], for 07/05/14 Will arrange LFTs pre visit.     Azucena Freed  05/31/2014, 4:03 PM Pager: [phone]

## 2014-05-31 NOTE — Progress Notes (Signed)
[facility] PROGRESS NOTE  [name] YDX:[id number] DOB: 12-04-1933 DOA: 05/28/2014 PCP: Valerie Richards, MD  Assessment/Plan: #1 syncope Likely secondary to orthostasis. Patient noted to be orthostatic on admission.  -she was on lasix, and hyzaar -hydrated with NS -Orthostatic vitals improved -CT head negative. 2-D echo with mild aortic valvular stenosis and moderate regurgitation -BP trending up, will resume losartan  #2 right upper quadrant abdominal pain/ELevated LFTs Abdominal ultrasound with a dilated common bile duct, however patient is status post cholecystectomy. Also also noted is hepatic steatosis. Patient with elevated LFT of AST/ALT.  HCV Ab reactive.  -GI following, unable to have MRCP due to pacer -plan for CT abd pelvis today to eval pancreas -symptomatically better, Cmet in am  #3 atrial fibrillation/tachybradycardia syndrome/status post PPM Currently rate controlled. INR 2.56. Coumadin for anticoagulation. -Hold Amiodarone due to Elevated LFTs, d/w [name] covering for [name], he is ok with holding this now and Gi workup but recommended resuming at discharge  #4 chronic kidney disease/status post left nephrectomy Currently stable.  #5 hypokalemia Likely secondary to diuretics. Repleted.  #6 history of mild aortic valvular stenosis 2-D echo with no significant change follow.  #7 history of postop knee infection Continue home regimen of minocycline.   Code Status: Full Family Communication: Updated patient and daughters at bedside. Disposition Plan: Home when medically stable, later today or tomorrow   Consultants:  GI  Procedures:  2-D echo 05/29/2014  Abdominal ultrasound 05/28/2014  Chest x-ray 05/28/2014  CT head 05/28/2014  Antibiotics:  None  HPI/Subjective: Doing better today, no N/V, eating breakfast  Objective: Filed Vitals:   05/31/14 0604  BP: 181/55  Pulse: 66  Temp: 98 F (36.7 C)  Resp: 18     Intake/Output Summary (Last 24 hours) at 05/31/14 1031 Last data filed at 05/31/14 0846  Gross per 24 hour  Intake    220 ml  Output      0 ml  Net    220 ml   Filed Weights   05/29/14 2128 05/30/14 0500 05/31/14 0604  Weight: 69.174 kg (152 lb 8 oz) 68.765 kg (151 lb 9.6 oz) 67.988 kg (149 lb 14.2 oz)    Exam:   General:  NAD  Cardiovascular: RRR  Respiratory: CTAB  Abdomen: Soft, nondistended, positive bowel sounds, minimal to mild tenderness to palpation in the right upper quadrant  Musculoskeletal: No clubbing cyanosis or edema   Data Reviewed: Basic Metabolic Panel:  Recent Labs Lab 05/28/14 1621 05/29/14 0313 05/30/14 0920 05/31/14 0915  NA 141 144 142 141  K 3.3* 3.6* 4.0 4.5  CL 101 106 105 109  CO2 30 26 27 22   GLUCOSE 82 84 106* 90  BUN 35* 35* 34* 29*  CREATININE 1.26* 1.41* 1.30* 1.14*  CALCIUM 8.5 8.2* 8.3* 8.3*   Liver Function Tests:  Recent Labs Lab 05/28/14 1621 05/31/14 0915  AST 194* 156*  ALT 121* 105*  ALKPHOS 194* 164*  BILITOT 0.7 0.8  PROT 6.2 5.6*  ALBUMIN 2.2* 2.0*    Recent Labs Lab 05/28/14 2133  LIPASE 37   No results found for this basename: AMMONIA,  in the last 168 hours CBC:  Recent Labs Lab 05/28/14 1621 05/29/14 0313 05/31/14 0915  WBC 6.2 6.1 5.8  NEUTROABS 3.5  --   --   HGB 14.2 13.2 13.8  HCT 41.9 39.6 41.1  MCV 91.3 91.9 91.5  PLT 93* 80* PENDING   Cardiac Enzymes:  Recent Labs Lab 05/28/14 1622 05/28/14 2133 05/29/14  8003 05/29/14 0940  TROPONINI <0.30 <0.30 <0.30 <0.30   BNP (last 3 results) No results found for this basename: PROBNP,  in the last 8760 hours CBG:  Recent Labs Lab 05/28/14 1640  GLUCAP 84    Recent Results (from the past 240 hour(s))  OVA AND PARASITE EXAMINATION     Status: None   Collection Time    05/29/14  8:00 PM      Result Value Ref Range Status   Specimen Description STOOL   Final   Special Requests NONE   Final   Ova and parasites     Final    Value: NO OVA OR PARASITES SEEN     Performed at [facility]   Report Status 05/30/2014 FINAL   Final  STOOL CULTURE     Status: None   Collection Time    05/29/14  8:00 PM      Result Value Ref Range Status   Specimen Description STOOL   Final   Special Requests NONE   Final   Culture     Final   Value: NO SUSPICIOUS COLONIES, CONTINUING TO HOLD     Performed at [facility]   Report Status PENDING   Incomplete     Studies: No results found.  Scheduled Meds: . atenolol  50 mg Oral BID  . levothyroxine  100 mcg Oral QAC breakfast  . losartan  50 mg Oral Daily  . minocycline  100 mg Oral BID  . pregabalin  75 mg Oral BID  . sertraline  50 mg Oral Daily  . warfarin  1 mg Oral Once per day on Sun Mon Tue Wed Thu Fri  . [START ON 06/01/2014] warfarin  2 mg Oral Once per day on Sat  . Warfarin - Physician Dosing Inpatient   Does not apply q1800   Continuous Infusions:   Principal Problem:   Syncope Active Problems:   ANXIETY   Gastroparesis   Atrial fibrillation   Tachycardia-bradycardia syndrome   S/P repair of patent ductus arteriosus   Aortic stenosis, moderate   Cardiac pacemaker (06/2010)   Hyperlipemia   Encounter for long-term (current) use of other medications   Elevated LFTs   Renal insufficiency, mild   Post op infection    Time spent: 11 mins    Valerie Forgione MD [facility] Pager [phone]. If 7PM-7AM, please contact night-coverage at [email or web address], password [facility] 05/31/2014, 10:31 AM  LOS: 3 days

## 2014-05-31 NOTE — Progress Notes (Signed)
Spoke with Dr. Broadus John in regards to amiodarone. She was going to order another set of liver enzymes to see what the trend was. At this point, holding amiodarone until labs resulted

## 2014-06-01 LAB — COMPREHENSIVE METABOLIC PANEL
ALK PHOS: 145 U/L — AB (ref 39–117)
ALT: 92 U/L — ABNORMAL HIGH (ref 0–35)
AST: 127 U/L — AB (ref 0–37)
Albumin: 1.8 g/dL — ABNORMAL LOW (ref 3.5–5.2)
Anion gap: 10 (ref 5–15)
BILIRUBIN TOTAL: 0.6 mg/dL (ref 0.3–1.2)
BUN: 29 mg/dL — ABNORMAL HIGH (ref 6–23)
CHLORIDE: 111 meq/L (ref 96–112)
CO2: 24 meq/L (ref 19–32)
Calcium: 8.2 mg/dL — ABNORMAL LOW (ref 8.4–10.5)
Creatinine, Ser: 1.34 mg/dL — ABNORMAL HIGH (ref 0.50–1.10)
GFR calc non Af Amer: 37 mL/min — ABNORMAL LOW (ref >90)
GFR, EST AFRICAN AMERICAN: 43 mL/min — AB (ref >90)
GLUCOSE: 69 mg/dL — AB (ref 70–99)
POTASSIUM: 4.3 meq/L (ref 3.7–5.3)
SODIUM: 145 meq/L (ref 137–147)
Total Protein: 5 g/dL — ABNORMAL LOW (ref 6.0–8.3)

## 2014-06-01 MED ORDER — LOSARTAN POTASSIUM 50 MG PO TABS: 50.0000 mg | ORAL_TABLET | Freq: Every day | ORAL | Status: DC

## 2014-06-01 MED ORDER — UNABLE TO FIND: Status: DC

## 2014-06-01 NOTE — ED Provider Notes (Signed)
Medical screening examination/treatment/procedure(s) were conducted as a shared visit with non-physician practitioner(s) and myself.  I personally evaluated the patient during the encounter.   EKG Interpretation   Date/Time:  Tuesday May 28 2014 15:39:26 EDT Ventricular Rate:  74 PR Interval:  198 QRS Duration: 97 QT Interval:  478 QTC Calculation: 530 R Axis:   25 Text Interpretation:  Atrial-paced rhythm Borderline repolarization  abnormality Non-specific ST-t changes Prolonged QT interval Reconfirmed by  Ashok Cordia  MD, Lennette Bihari ([id number]) on 05/28/2014 6:11:32 PM      Pt c/o near syncopal/syncopal episode. No chest pain. Hx same. Monitor. Ecg. Labs.   Mirna Mires, MD 06/01/14 2119

## 2014-06-01 NOTE — Discharge Summary (Signed)
Physician Discharge Summary  [name] BOF:[id number] DOB: 22-May-1934 DOA: 05/28/2014  PCP: [name], MD  Admit date: 05/28/2014 Discharge date: 06/01/2014  Time spent: 45 minutes  Recommendations for Outpatient Follow-up:  1. PCP Dr.McKeown in 1 week 2. CMet to reassess LFTs in 1 week, PLEASE Resume AMIODARONE if LFTS trending down 3. Dr.Brackbill in 1 week (COnsider resuming amiodarone if LFTs down and reassess need for low dose diuretics) 4. [name], GI PA on 10/30  Discharge Diagnoses:  Principal Problem:   Syncope   Orthostatic hypotension   Chronic diastolic CHF   ANXIETY   Gastroparesis   Atrial fibrillation   Tachycardia-bradycardia syndrome   S/P repair of patent ductus arteriosus   Aortic stenosis, moderate   Cardiac pacemaker (06/2010)   Hyperlipemia   Encounter for long-term (current) use of other medications   Elevated LFTs   Renal insufficiency, mild   Post op infection on minocycline   Discharge Condition: stable  Diet recommendation: Heart healthy, low sodium  Filed Weights   05/30/14 0500 05/31/14 0604 06/01/14 0516  Weight: 68.765 kg (151 lb 9.6 oz) 67.988 kg (149 lb 14.2 oz) 68.1 kg (150 lb 2.1 oz)    History of present illness:  80 yo female h/o tachy brady syndrome s/p pacemaker, CAD, gastroparesis, chronic diarrhea, moderate aortic stenosis, recent knee replacement infected, comes in after syncope today. She was getting up and walking when she got very dizzy and passed out to the floor. Her dtr was present and broke her fall. Family reported these dizzy spells have been going on for months, and they have addressed this with both her cardiologist and pcp. Cardiologist advised for them to see her pcp, which they did but no medications were adjusted, but another medicine was added which they do not recall what that was. They check her bp when these symptoms occur and it is usually very low. She denies symptoms right now, no sob, no cp  no recent fevers. She has been dealing with a recent knee surgery with postop infection, and ligament repair and is on abx chronically for that at the moment. She has chronic diarrhea, no change in that   Hospital Course:  1 syncope  -secondary to orthostatic hypotension, noted to be orthostatic on admission.  -she was on lasix, and hyzaar which were held -hydrated with NS -Orthostatic vitals improved  -CT head negative. 2-D echo with mild aortic valvular stenosis and moderate regurgitation  -BP trending up, hence resumed losartan  -remains euvolemic, needs to be reassessed by PCP/Cards soon abt need to resume diuretics at low dose  #2 right upper quadrant abdominal pain/ELevated LFTs  Abdominal ultrasound with a dilated common bile duct, however patient is status post cholecystectomy. Also also noted is hepatic steatosis. Patient with elevated LFT of AST/ALT.  -HCV Ab reactive, FU Check HCV RNA. -Seen by GI unable to have MRCP due to pacer  -per GI recs underwent CT abd pelvis, based on CT mild CBD dilation appears to be benign, likely post cholecystectomy and age related finding on imaging -symptomatically better, LFTS trending down, GI recommends outpatent FU with Dr.Kaplan  #3 atrial fibrillation/tachybradycardia syndrome/status post PPM  Currently rate controlled. INR 2.56. Coumadin for anticoagulation.  -Held Amiodarone due to Elevated LFTs, d/w Dr.Crenshaw covering for Dr.Brackbill, he is ok with holding this now and Gi workup but recommended resuming as soon as possible -LFTS improving but still > twice upper limit of normal, and hence she is advised to get LFTS checked  next week and to resume Amiodarone next week if LFTS acceptable range -I have called Cards PA and requested FU, who notified me that Cards office will call her Monday am   #4 chronic kidney disease/status post left nephrectomy  Currently stable.   #5 hypokalemia  Likely secondary to diuretics. Repleted.   #6  history of mild aortic valvular stenosis  2-D echo with no significant change   #7 history of postop knee infection  Continue home regimen of minocycline   Consultations:  GI  D/w Cardiology via telephone  Discharge Exam: Filed Vitals:   06/01/14 0516  BP: 143/63  Pulse: 63  Temp: 98 F (36.7 C)  Resp: 18    General: AAOx3 Cardiovascular: S1S2/RRR Respiratory: CTAB  Discharge Instructions You were cared for by a hospitalist during your hospital stay. If you have any questions about your discharge medications or the care you received while you were in the hospital after you are discharged, you can call the unit and asked to speak with the hospitalist on call if the hospitalist that took care of you is not available. Once you are discharged, your primary care physician will handle any further medical issues. Please note that NO REFILLS for any discharge medications will be authorized once you are discharged, as it is imperative that you return to your primary care physician (or establish a relationship with a primary care physician if you do not have one) for your aftercare needs so that they can reassess your need for medications and monitor your lab values.  Discharge Instructions   Diet - low sodium heart healthy    Complete by:  As directed      Increase activity slowly    Complete by:  As directed           Current Discharge Medication List    START taking these medications   Details  losartan (COZAAR) 50 MG tablet Take 1 tablet (50 mg total) by mouth daily. Qty: 30 tablet, Refills: 0    UNABLE TO FIND Lab-Cmet Qty: 1 each, Refills: 0      CONTINUE these medications which have NOT CHANGED   Details  acetaminophen (TYLENOL) 500 MG tablet Take 1,000 mg by mouth every 6 (six) hours as needed for moderate pain.    ALPRAZolam (XANAX) 0.5 MG tablet Take 0.5 mg by mouth daily as needed for anxiety.    atenolol (TENORMIN) 100 MG tablet Take 50 mg by mouth 2 (two)  times daily.     atorvastatin (LIPITOR) 80 MG tablet Take 80 mg by mouth daily.    Biotin 5000 MCG CAPS Take 5,000 mcg by mouth 2 (two) times daily.    Cholecalciferol (VITAMIN D3) 2000 UNITS TABS Take 2,000 Units by mouth 2 (two) times daily.     levothyroxine (SYNTHROID, LEVOTHROID) 100 MCG tablet Take 100 mcg by mouth daily before breakfast.    Magnesium 250 MG TABS Take 250 mg by mouth daily.     meclizine (ANTIVERT) 25 MG tablet Take 25 mg by mouth as needed for dizziness or nausea.    methocarbamol (ROBAXIN) 500 MG tablet Take 500 mg by mouth every 6 (six) hours as needed for muscle spasms.    minocycline (MINOCIN,DYNACIN) 100 MG capsule Take 100 mg by mouth 2 (two) times daily. Long-term antibiotic per Dr. Aurea Graff office for knee infection    nitroGLYCERIN (NITRODUR - DOSED IN MG/24 HR) 0.4 mg/hr patch Place 0.4 mg onto the skin daily as needed (chest pain).  nitroGLYCERIN (NITROSTAT) 0.4 MG SL tablet Place 0.4 mg under the tongue every 5 (five) minutes as needed for chest pain.    pregabalin (LYRICA) 75 MG capsule Take 75 mg by mouth 2 (two) times daily.    ranitidine (ZANTAC) 150 MG tablet Take 150 mg by mouth 2 (two) times daily.     sertraline (ZOLOFT) 50 MG tablet Take 50 mg by mouth daily.    traMADol (ULTRAM) 50 MG tablet Take 50 mg by mouth every 6 (six) hours as needed for moderate pain.     warfarin (COUMADIN) 2 MG tablet Take 1-2 mg by mouth daily. 1 mg daily except 2 mg on Saturdays per coag clinic;      STOP taking these medications     amiodarone (PACERONE) 200 MG tablet      furosemide (LASIX) 40 MG tablet      losartan-hydrochlorothiazide (HYZAAR) 100-25 MG per tablet      potassium chloride SA (K-DUR,KLOR-CON) 20 MEQ tablet        Allergies  Allergen Reactions  . Codeine Nausea And Vomiting  . Hydrocodone Other (See Comments)    hallucination  . Morphine And Related Nausea And Vomiting  . Requip [Ropinirole Hcl]     Headache   . Zinc      nausea   Follow-up Information   Follow up with ZEHR, JESSICA D., PA-C On 07/05/2014. (9:30 AM for GI follow up. )    Specialty:  Gastroenterology   Contact information:   [place] [address] [phone]       Follow up with Darlin Coco, MD In 1 week. (the office will call you Monday, if you have not heard from the office by 1 pm call them to check. [phone] )    Specialty:  Cardiology   Contact information:   [address] [place] [address] [phone]        The results of significant diagnostics from this hospitalization (including imaging, microbiology, ancillary and laboratory) are listed below for reference.    Significant Diagnostic Studies: Dg Chest 2 View  05/28/2014   CLINICAL DATA:  Syncope, shortness of breath, history coronary artery disease, hypertension  EXAM: CHEST  2 VIEW  COMPARISON:  12/18/2013  FINDINGS: RIGHT subclavian transvenous pacemaker leads project at RIGHT atrium and RIGHT ventricle.  Upper normal heart size.  Atherosclerotic calcification aorta.  Mediastinal contours pulmonary vascularity normal.  Emphysematous and bronchitic changes consistent with COPD.  Lungs clear.  No pleural effusion or pneumothorax.  Bones demineralized.  IMPRESSION: Pacemaker.  COPD changes.  No acute abnormalities.   Electronically Signed   By: Lavonia Dana M.D.   On: 05/28/2014 17:57   Ct Head Wo Contrast  05/28/2014   CLINICAL DATA:  Near syncopal episode  EXAM: CT HEAD WITHOUT CONTRAST  TECHNIQUE: Contiguous axial images were obtained from the base of the skull through the vertex without intravenous contrast.  COMPARISON:  MRI brain 06/01/2004  FINDINGS: There is no evidence of mass effect, midline shift, or extra-axial fluid collections. There is no evidence of a space-occupying lesion or intracranial hemorrhage. There is no evidence of a cortical-based area of acute infarction. There is generalized cerebral atrophy. There is periventricular  white matter low attenuation likely secondary to microangiopathy.  The ventricles and sulci are appropriate for the patient's age. The basal cisterns are patent.  Visualized portions of the orbits are unremarkable. The visualized portions of the paranasal sinuses and mastoid air cells are  unremarkable. Cerebrovascular atherosclerotic calcifications are noted.  The osseous structures are unremarkable.  IMPRESSION: No acute intracranial pathology.   Electronically Signed   By: Kathreen Devoid   On: 05/28/2014 19:28   US Abdomen Complete  05/29/2014   CLINICAL DATA:  Elevated liver function tests.  EXAM: ULTRASOUND ABDOMEN COMPLETE  COMPARISON:  07/17/2012 CT  FINDINGS: Gallbladder:  Surgically absent  Common bile duct:  Diameter: 14 mm.  Where visualized, no filling defect.  Liver:  Diffuse increased echogenicity. No evidence of focal lesion. Antegrade flow in the imaged portal venous system.  IVC:  No abnormality visualized.  Pancreas:  No evidence of ductal enlargement.  No visible mass or inflammation.  Spleen:  Size and appearance within normal limits.  Right Kidney:  Length: 10 cm. Echogenicity within normal limits. No mass or hydronephrosis visualized.  Left Kidney:  Surgically absent  Abdominal aorta:  Echogenic wall consistent with atherosclerosis. No evidence of aneurysm.  Other findings:  None.  IMPRESSION: 1. Dilated common bile duct at 14 mm. Although this could be related to remote cholecystectomy, correlate with liver function tests to exclude biliary obstruction. 2. Hepatic steatosis or other diffuse hepatocellular disease. 3. Left nephrectomy.   Electronically Signed   By: Jorje Guild M.D.   On: 05/29/2014 03:02   Ct Abdomen Pelvis W Contrast  05/31/2014   CLINICAL DATA:  Elevated liver function tests, abdominal pain. Prior left nephrectomy, cholecystectomy. Dilated common bile duct at prior exam.  EXAM: CT ABDOMEN AND PELVIS WITH CONTRAST  TECHNIQUE: Multidetector CT imaging of the abdomen and  pelvis was performed using the standard protocol following bolus administration of intravenous contrast.  CONTRAST:  6mL OMNIPAQUE IOHEXOL 300 MG/ML  SOLN  COMPARISON:  Abdominal ultrasound 05/28/2014, CT abdomen/ pelvis 07/17/2012 and 04/12/2011  FINDINGS: Trace pleural effusions are present with associated minimal compressive atelectasis. Calcified granuloma, left lung base image 15. Pacer leads partly visualized.  Cholecystectomy clips are noted. Fusiform mild prominence of the mid common duct measuring 1 cm maximally with tapering to the ampulla is identified. Compared to prior exams, there is no significant interval change. Allowing for CT technique, no filling defect is identified. No pancreatic ductal dilatation. Stable minimal prominence of the central hepatic ducts. Adrenal glands, spleen, pancreas, and right kidney are unremarkable. Left kidney surgically absent without mass in the left nephrectomy bed.  Moderate atheromatous aortic calcification is identified without aneurysm.  Pelvic sidewall clips are identified. No pelvic lymphadenopathy. Trace if any free pelvic fluid is identified, image 65, not well visualized due to streak artifact but likely physiologic. No bowel wall thickening or focal segmental dilatation.  Uterus presumed surgically absent. Neither ovary visualized. No adnexal mass.  No acute osseous abnormality. Degenerative changes are noted in the spine.  IMPRESSION: Similar mild prominence of the common duct with gradual tapering to the ampulla, likely a combination of postcholecystectomy finding and patient age. No focal cut off or radiopaque filling defect identified. No acute intra-abdominal or pelvic pathology.   Electronically Signed   By: Conchita Paris M.D.   On: 05/31/2014 15:48    Microbiology: Recent Results (from the past 240 hour(s))  OVA AND PARASITE EXAMINATION     Status: None   Collection Time    05/29/14  8:00 PM      Result Value Ref Range Status   Specimen  Description STOOL   Final   Special Requests NONE   Final   Ova and parasites     Final   Value:  NO OVA OR PARASITES SEEN     Performed at [facility]   Report Status 05/30/2014 FINAL   Final  STOOL CULTURE     Status: None   Collection Time    05/29/14  8:00 PM      Result Value Ref Range Status   Specimen Description STOOL   Final   Special Requests NONE   Final   Culture     Final   Value: NO SUSPICIOUS COLONIES, CONTINUING TO HOLD     Performed at [facility]   Report Status PENDING   Incomplete     Labs: Basic Metabolic Panel:  Recent Labs Lab 05/28/14 1621 05/29/14 0313 05/30/14 0920 05/31/14 0915 06/01/14 0537  NA 141 144 142 141 145  K 3.3* 3.6* 4.0 4.5 4.3  CL 101 106 105 109 111  CO2 30 26 27 22 24   GLUCOSE 82 84 106* 90 69*  BUN 35* 35* 34* 29* 29*  CREATININE 1.26* 1.41* 1.30* 1.14* 1.34*  CALCIUM 8.5 8.2* 8.3* 8.3* 8.2*   Liver Function Tests:  Recent Labs Lab 05/28/14 1621 05/31/14 0915 05/31/14 0918 06/01/14 0537  AST 194* 156* 176* 127*  ALT 121* 105* 103* 92*  ALKPHOS 194* 164* 165* 145*  BILITOT 0.7 0.8 0.7 0.6  PROT 6.2 5.6* 5.7* 5.0*  ALBUMIN 2.2* 2.0* 2.0* 1.8*    Recent Labs Lab 05/28/14 2133  LIPASE 37   No results found for this basename: AMMONIA,  in the last 168 hours CBC:  Recent Labs Lab 05/28/14 1621 05/29/14 0313 05/31/14 0915  WBC 6.2 6.1 5.8  NEUTROABS 3.5  --   --   HGB 14.2 13.2 13.8  HCT 41.9 39.6 41.1  MCV 91.3 91.9 91.5  PLT 93* 80* 70*   Cardiac Enzymes:  Recent Labs Lab 05/28/14 1622 05/28/14 2133 05/29/14 0313 05/29/14 0940  TROPONINI <0.30 <0.30 <0.30 <0.30   BNP: BNP (last 3 results) No results found for this basename: PROBNP,  in the last 8760 hours CBG:  Recent Labs Lab 05/28/14 1640  GLUCAP 84       Signed:  Khoi Hamberger  [facility] 06/01/2014, 9:09 AM

## 2014-06-01 NOTE — Evaluation (Addendum)
Physical Therapy Evaluation Patient Details Name: NEGIN HEGG MRN: [id number] DOB: [birth year] Today's Date: 06/01/2014   History of Present Illness  80 yo female h/o tachy brady syndrome s/p pacemaker, CAD, gastroparesis, chronic diarrhea, moderate aortic stenosis, recent knee replacement infected, comes in after syncope today. She was getting up and walking when she got very dizzy and passed out to the floor.  Clinical Impression  Pt is being discharged potentially today and has plan in place for continual family supervision at home.  Her best efforts are sufficient for discharge home but needs continual gait supervision until PT [facility] clears for family.  Unable to climb stairs but has arranged level home setup.  Will not need further therapy here if kept on.    Follow Up Recommendations Home health PT;Supervision/Assistance - 24 hour    Equipment Recommendations  None recommended by PT    Recommendations for Other Services Other (comment)     Precautions / Restrictions Precautions Precautions: Fall Required Braces or Orthoses: Knee Immobilizer - Right Knee Immobilizer - Right: On at all times Restrictions Weight Bearing Restrictions: No      Mobility  Bed Mobility Overal bed mobility: Modified Independent             General bed mobility comments: slow but can lift RLE into bed  Transfers Overall transfer level: Modified independent Equipment used: Rolling walker (2 wheeled)             General transfer comment: static balance is fine but needs supervision once the transfer starts  Ambulation/Gait Ambulation/Gait assistance: Min guard Ambulation Distance (Feet): 150 Feet Assistive device: Rolling walker (2 wheeled) Gait Pattern/deviations: Step-through pattern;Decreased step length - right;Decreased step length - left;Decreased dorsiflexion - right;Decreased dorsiflexion - left;Wide base of support Gait velocity: too fast for current ability Gait velocity  interpretation: at or above normal speed for age/gender General Gait Details: has immobilizer on RLE and cannot step with big steps but tries to keep her speed going too fast for safety  Stairs            Wheelchair Mobility    Modified Rankin (Stroke Patients Only)       Balance Overall balance assessment: Needs assistance;History of Falls   Sitting balance-Leahy Scale: Good Sitting balance - Comments: one LE supporting Postural control: Other (comment) Standing balance support: Bilateral upper extremity supported Standing balance-Leahy Scale: Fair                               Pertinent Vitals/Pain Pain Assessment: No/denies pain BP was 136/68, pulse 70 and O2 sat was 97%.  Per nsg notes    Home Living Family/patient expects to be discharged to:: Private residence Living Arrangements: Spouse/significant other Available Help at Discharge: Family;Available 24 hours/day Type of Home: House Home Access: Level entry     Home Layout: Multi-level;Able to live on main level with bedroom/bathroom Home Equipment: Wheelchair - manual;Cane - single point;Crutches;Cane - quad;Bedside commode;Shower seat;Walker - 2 wheels;Grab bars - tub/shower;Hand held shower head;Grab bars - toilet;Adaptive equipment Additional Comments: handicapped height commode    Prior Function Level of Independence: Independent with assistive device(s)         Comments: with walker     Hand Dominance        Extremity/Trunk Assessment   Upper Extremity Assessment: Overall WFL for tasks assessed           Lower Extremity Assessment: RLE deficits/detail RLE  Deficits / Details: wearing immbilizer due to quad tendon repair and will be seeing  ortho when she can reschedule.    Cervical / Trunk Assessment: Kyphotic  Communication   Communication: No difficulties  Cognition Arousal/Alertness: Awake/alert Behavior During Therapy: WFL for tasks assessed/performed Overall  Cognitive Status: Impaired/Different from baseline Area of Impairment: Safety/judgement;Awareness     Memory: Decreased recall of precautions   Safety/Judgement: Decreased awareness of safety Awareness: Anticipatory   General Comments: cues are needed at transitions and to establish limits, esp since pt is interested in being independent    General Comments General comments (skin integrity, edema, etc.): Daughter is present and family is planning to cover 24/7 to assist pt for now.    Exercises        Assessment/Plan    PT Assessment All further PT needs can be met in the next venue of care  PT Diagnosis Difficulty walking   PT Problem List Decreased strength;Decreased range of motion;Decreased activity tolerance;Decreased mobility;Decreased balance;Decreased coordination;Decreased cognition;Decreased knowledge of use of DME;Decreased safety awareness;Cardiopulmonary status limiting activity;Decreased skin integrity  PT Treatment Interventions     PT Goals (Current goals can be found in the Care Plan section) Acute Rehab PT Goals Patient Stated Goal: To go home and be independent PT Goal Formulation: With patient Time For Goal Achievement: 06/07/14 Potential to Achieve Goals: Good    Frequency     Barriers to discharge        Co-evaluation               End of Session Equipment Utilized During Treatment: Other (comment);Right knee immobilizer Activity Tolerance: Patient tolerated treatment well;Other (comment) (2 brief gait stops for SOB) Patient left: in bed;with call bell/phone within reach;with family/visitor present Nurse Communication: Mobility status    Functional Assessment Tool Used:  (G codes) Functional Limitation: Mobility: Walking and moving around Mobility: Walking and Moving Around Current Status (J0315): At least 20 percent but less than 40 percent impaired, limited or restricted Mobility: Walking and Moving Around Goal Status [phone]): 0 percent  impaired, limited or restricted    Time: 1021-1039 PT Time Calculation (min): 18 min   Charges:   PT Evaluation $Initial PT Evaluation Tier I: 1 Procedure     PT G Codes:   Functional Assessment Tool Used:  (G codes) Functional Limitation: Mobility: Walking and moving around    Ramond Dial 06/01/2014, 11:46 AM Mee Hives, PT MS Acute Rehab Dept. Number: [phone]

## 2014-06-02 LAB — STOOL CULTURE

## 2014-06-02 LAB — HCV RNA QUANT RFLX ULTRA OR GENOTYP: HCV QUANT: NOT DETECTED [IU]/mL — AB (ref <15)

## 2014-06-04 ENCOUNTER — Ambulatory Visit (INDEPENDENT_AMBULATORY_CARE_PROVIDER_SITE_OTHER): Payer: Medicare Other | Admitting: Internal Medicine

## 2014-06-04 ENCOUNTER — Encounter: Payer: Self-pay | Admitting: Internal Medicine

## 2014-06-04 VITALS — BP 140/98 | HR 80 | Temp 97.9°F | Resp 18 | Ht 60.05 in | Wt 161.8 lb

## 2014-06-04 DIAGNOSIS — I10 Essential (primary) hypertension: Secondary | ICD-10-CM

## 2014-06-04 DIAGNOSIS — R609 Edema, unspecified: Secondary | ICD-10-CM

## 2014-06-04 DIAGNOSIS — Z79899 Other long term (current) drug therapy: Secondary | ICD-10-CM

## 2014-06-04 NOTE — Patient Instructions (Signed)
  Restart Lasix 40 mg  X 1/2 tablet daily  Monitor weights daily    Call if questions about how to  adjust Lasix

## 2014-06-04 NOTE — Progress Notes (Signed)
Subjective:    Patient ID: [name], female    DOB: 01/17/1934, 80 y.o.   MRN: [id number]  HPI Patient wa just hospitalized 8/22-26 for a syncopal episode with ? LOC anf Hx/o presyncopal episodes and meds were adjusted - changing Hyzaar to Cozaar and stopping Lasix. Also, Amiodarone dose was decreased.   Patient presents to day with elevated BP 140/88 and moderate dependent ankle edema. No other cardiac Sx's expressed.    Medication List   acetaminophen 500 MG tablet  Commonly known as:  TYLENOL  Take 1,000 mg by mouth every 6 (six) hours as needed for moderate pain.     ALPRAZolam 0.5 MG tablet  Commonly known as:  XANAX  Take 0.5 mg by mouth daily as needed for anxiety.     atenolol 100 MG tablet  Commonly known as:  TENORMIN  Take 50 mg by mouth 2 (two) times daily.     atorvastatin 80 MG tablet  Commonly known as:  LIPITOR  Take 80 mg by mouth daily.     Biotin 5000 MCG Caps  Take 1,000 mcg by mouth daily.     levothyroxine 100 MCG tablet  Commonly known as:  SYNTHROID, LEVOTHROID  Take 100 mcg by mouth daily before breakfast.     losartan 50 MG tablet  Commonly known as:  COZAAR  Take 1 tablet (50 mg total) by mouth daily.     Magnesium 250 MG Tabs  Take 250 mg by mouth daily.     meclizine 25 MG tablet  Commonly known as:  ANTIVERT  Take 25 mg by mouth as needed for dizziness or nausea.     methocarbamol 500 MG tablet  Commonly known as:  ROBAXIN  Take 500 mg by mouth every 6 (six) hours as needed for muscle spasms.     minocycline 100 MG capsule  Commonly known as:  MINOCIN,DYNACIN  Take 100 mg by mouth 2 (two) times daily. Long-term antibiotic per Dr. Aurea Graff office for knee infection     nitroGLYCERIN 0.4 MG SL tablet  Commonly known as:  NITROSTAT  Place 0.4 mg under the tongue every 5 (five) minutes as needed for chest pain.     nitroGLYCERIN 0.4 mg/hr patch  Commonly known as:  NITRODUR - Dosed in mg/24 hr  Place 0.4 mg onto the skin daily  as needed (chest pain).     pregabalin 75 MG capsule  Commonly known as:  LYRICA  Take 75 mg by mouth 2 (two) times daily.     ranitidine 150 MG tablet  Commonly known as:  ZANTAC  Take 150 mg by mouth 2 (two) times daily.     sertraline 50 MG tablet  Commonly known as:  ZOLOFT  Take 50 mg by mouth daily.     traMADol 50 MG tablet  Commonly known as:  ULTRAM  Take 50 mg by mouth every 6 (six) hours as needed for moderate pain.     Vitamin D3 2000 UNITS Tabs  Take 2,000 Units by mouth 2 (two) times daily.     warfarin 2 MG tablet  Commonly known as:  COUMADIN  Take 1-2 mg by mouth daily. 1 mg daily except 2 mg on Saturdays per coag clinic;        Allergies  Allergen Reactions  . Codeine Nausea And Vomiting  . Hydrocodone Other (See Comments)    hallucination  . Morphine And Related Nausea And Vomiting  . Requip [Ropinirole Hcl]  Headache   . Zinc     nausea   Past Medical History  Diagnosis Date  . PAF (paroxysmal atrial fibrillation)     controlled with amiodarone, on coumadin  . CAD (coronary artery disease)   . Aortic root dilatation   . Moderate aortic stenosis   . Chronic anticoagulation   . Tachycardia-bradycardia syndrome     s/p PPM by Dr Doreatha Lew ([facility]) 07/03/10  . GERD (gastroesophageal reflux disease)   . Chronic kidney disease   . Pacemaker     2012  . Neuropathy     feet/legs  . Restless leg syndrome   . Headache(784.0)     hx migraines - takes Zoloft for migraines  . History of uterine cancer 1980s    treated with hysterectomy, external radiation and radiation seed implants.   . Cancer     hx Kidney cancer / hx of endometrial cancer  . PONV (postoperative nausea and vomiting)   . Anxiety   . Heart murmur   . HTN (hypertension)   . Hyperlipemia   . Hypothyroidism   . 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 Osteomyelitis as child  . Family history of anesthesia complication     daughter has ponv  . Syncope 05/28/2014   Review of Systems  Pertinent (+) as above and otherwise 10 pt review Neg.  Objective:   Physical Exam BP 140/98  Pulse 80  Temp 97.9 F   Resp 18  Ht 5' 0.05"   Wt 161 lb 12.8 oz   BMI 31.56 kg/m2  HEENT - Eac's patent. TM's Nl. EOM's full. PERRLA. NasoOroPharynx clear. Neck - supple. Nl Thyroid. Carotids 2+ & No bruits, nodes, JVD Chest - Clear equal BS w/o Rales, rhonchi, wheezes. Cor - Nl HS. RRR w/soft Gr 2 Ao Sys M and no GR. PP obscured by 2(+) ankle edema. Abd - No palpable organomegaly, masses or tenderness. BS nl. MS- FROM w/o deformities. Muscle power, tone and bulk Nl. Neuro - No obvious Cr N abnormalities. Sensory, motor and Cerebellar functions appear Nl w/o focal abnormalities. Psyche - Mental status normal & appropriate.  No delusions, ideations or obvious mood abnormalities.  Assessment & Plan:   1. Hypertension   2. Edema - restart Lasix 40 mg 1/2 to 1 tab and monitor daily weights and BP's.  3. Encounter for long-term (current) use of other medications  - CBC with Differential - BASIC METABOLIC PANEL WITH GFR - Hepatic function panel  - ROV 1 week to recheck

## 2014-06-05 LAB — HEPATIC FUNCTION PANEL
ALT: 90 U/L — AB (ref 0–35)
AST: 110 U/L — ABNORMAL HIGH (ref 0–37)
Albumin: 2.5 g/dL — ABNORMAL LOW (ref 3.5–5.2)
Alkaline Phosphatase: 183 U/L — ABNORMAL HIGH (ref 39–117)
BILIRUBIN TOTAL: 0.8 mg/dL (ref 0.2–1.2)
Bilirubin, Direct: 0.3 mg/dL (ref 0.0–0.3)
Indirect Bilirubin: 0.5 mg/dL (ref 0.2–1.2)
Total Protein: 5.6 g/dL — ABNORMAL LOW (ref 6.0–8.3)

## 2014-06-05 LAB — CBC WITH DIFFERENTIAL/PLATELET
BASOS PCT: 1 % (ref 0–1)
Basophils Absolute: 0.1 10*3/uL (ref 0.0–0.1)
EOS ABS: 0.2 10*3/uL (ref 0.0–0.7)
EOS PCT: 3 % (ref 0–5)
HCT: 39.8 % (ref 36.0–46.0)
Hemoglobin: 13.1 g/dL (ref 12.0–15.0)
LYMPHS ABS: 1.8 10*3/uL (ref 0.7–4.0)
Lymphocytes Relative: 26 % (ref 12–46)
MCH: 29.9 pg (ref 26.0–34.0)
MCHC: 32.9 g/dL (ref 30.0–36.0)
MCV: 90.9 fL (ref 78.0–100.0)
Monocytes Absolute: 0.6 10*3/uL (ref 0.1–1.0)
Monocytes Relative: 9 % (ref 3–12)
NEUTROS PCT: 61 % (ref 43–77)
Neutro Abs: 4.1 10*3/uL (ref 1.7–7.7)
PLATELETS: 98 10*3/uL — AB (ref 150–400)
RBC: 4.38 MIL/uL (ref 3.87–5.11)
RDW: 14.3 % (ref 11.5–15.5)
WBC: 6.8 10*3/uL (ref 4.0–10.5)

## 2014-06-05 LAB — BASIC METABOLIC PANEL WITH GFR
BUN: 28 mg/dL — ABNORMAL HIGH (ref 6–23)
CALCIUM: 8.3 mg/dL — AB (ref 8.4–10.5)
CO2: 22 mEq/L (ref 19–32)
Chloride: 108 mEq/L (ref 96–112)
Creat: 1.19 mg/dL — ABNORMAL HIGH (ref 0.50–1.10)
GFR, EST AFRICAN AMERICAN: 51 mL/min — AB
GFR, Est Non African American: 44 mL/min — ABNORMAL LOW
Glucose, Bld: 71 mg/dL (ref 70–99)
Potassium: 4.4 mEq/L (ref 3.5–5.3)
SODIUM: 139 meq/L (ref 135–145)

## 2014-06-06 LAB — POCT INR: INR: 2.9

## 2014-06-07 ENCOUNTER — Ambulatory Visit (INDEPENDENT_AMBULATORY_CARE_PROVIDER_SITE_OTHER): Payer: Medicare Other | Admitting: Cardiology

## 2014-06-07 DIAGNOSIS — I4891 Unspecified atrial fibrillation: Secondary | ICD-10-CM

## 2014-06-10 ENCOUNTER — Ambulatory Visit (INDEPENDENT_AMBULATORY_CARE_PROVIDER_SITE_OTHER): Payer: Medicare Other | Admitting: Internal Medicine

## 2014-06-10 ENCOUNTER — Encounter: Payer: Medicare Other | Admitting: Nurse Practitioner

## 2014-06-10 ENCOUNTER — Encounter: Payer: Self-pay | Admitting: Internal Medicine

## 2014-06-10 VITALS — BP 122/74 | HR 72 | Temp 97.7°F | Resp 16 | Ht 60.5 in | Wt 157.2 lb

## 2014-06-10 DIAGNOSIS — R609 Edema, unspecified: Secondary | ICD-10-CM

## 2014-06-10 DIAGNOSIS — Z79899 Other long term (current) drug therapy: Secondary | ICD-10-CM

## 2014-06-10 DIAGNOSIS — I10 Essential (primary) hypertension: Secondary | ICD-10-CM

## 2014-06-10 NOTE — Progress Notes (Signed)
   Subjective:    Patient ID: [name], female    DOB: Dec 22, 1933, 80 y.o.   MRN: [id number]  HPI  Patient returns for 1 week f/u after having restarted her lasix and has lost 4 & 1/2 # in the last week and resolution of her dependent edema. She denies any Sx's of Rt or Lt heart failure. No Resp, Cardiac, GI or GU Sx's.  Meds/All/PMH - reviewed and unchanged from 1 week ago.  Review of Systems  In addition to the HPI above,  No Fever-chills,  No Headache, No changes with Vision or hearing,  No problems swallowing food or Liquids,  No Chest pain or productive Cough or Shortness of Breath,  No Abdominal pain, No Nausea or Vomitting, Bowel movements are regular,  No Blood in stool or Urine,  No dysuria,  No new skin rashes or bruises,  No new joints pains-aches,  No new weakness, tingling, numbness in any extremity,  No recent weight loss,  No polyuria, polydypsia or polyphagia,  No significant Mental Stressors.  A full 10 point Review of Systems was done, except as stated above, all other Review of Systems were negative  Objective:   Physical Exam BP 122/74  Pulse 72  Temp(Src) 97.7 F (36.5 C) (Temporal)  Resp 16  Ht 5' 0.5" (1.537 m)  Wt 157 lb 3.2 oz (71.305 kg)  BMI 30.18 kg/m2  HEENT - Eac's patent. TM's Nl. EOM's full. PERRLA. NasoOroPharynx clear. Neck - supple. Nl Thyroid. Carotids 2+ & No bruits, nodes, JVD Chest - Clear equal BS w/o Rales, rhonchi, wheezes. Cor - Nl HS. RRR w/o sig MGR. PP 1(+). No edema. MS- Rt short let brace and patient using a cane Neuro - No obvious Cr N abnormalities. Sensory, motor and Cerebellar functions appear Nl w/o focal abnormalities. Psyche - Mental status normal & appropriate.  No delusions, ideations or obvious mood abnormalities.  Assessment & Plan:   1. Essential hypertension  2. Edema  - improved  3. Medication management  - BASIC METABOLIC PANEL WITH GFR

## 2014-06-11 ENCOUNTER — Ambulatory Visit (INDEPENDENT_AMBULATORY_CARE_PROVIDER_SITE_OTHER): Payer: Medicare Other | Admitting: Cardiology

## 2014-06-11 DIAGNOSIS — I4891 Unspecified atrial fibrillation: Secondary | ICD-10-CM

## 2014-06-11 LAB — BASIC METABOLIC PANEL WITH GFR
BUN: 23 mg/dL (ref 6–23)
CALCIUM: 8.3 mg/dL — AB (ref 8.4–10.5)
CO2: 24 mEq/L (ref 19–32)
CREATININE: 1.4 mg/dL — AB (ref 0.50–1.10)
Chloride: 107 mEq/L (ref 96–112)
GFR, Est African American: 42 mL/min — ABNORMAL LOW
GFR, Est Non African American: 36 mL/min — ABNORMAL LOW
Glucose, Bld: 78 mg/dL (ref 70–99)
Potassium: 4.1 mEq/L (ref 3.5–5.3)
SODIUM: 141 meq/L (ref 135–145)

## 2014-06-11 LAB — POCT INR: INR: 3

## 2014-06-18 ENCOUNTER — Encounter: Payer: Medicare Other | Admitting: Nurse Practitioner

## 2014-06-19 ENCOUNTER — Encounter: Payer: Self-pay | Admitting: Nurse Practitioner

## 2014-06-19 ENCOUNTER — Ambulatory Visit (INDEPENDENT_AMBULATORY_CARE_PROVIDER_SITE_OTHER): Payer: Medicare Other | Admitting: Nurse Practitioner

## 2014-06-19 VITALS — BP 140/86 | HR 69 | Ht 61.0 in | Wt 154.4 lb

## 2014-06-19 DIAGNOSIS — R945 Abnormal results of liver function studies: Secondary | ICD-10-CM

## 2014-06-19 DIAGNOSIS — Z79899 Other long term (current) drug therapy: Secondary | ICD-10-CM

## 2014-06-19 DIAGNOSIS — R7989 Other specified abnormal findings of blood chemistry: Secondary | ICD-10-CM

## 2014-06-19 DIAGNOSIS — I4891 Unspecified atrial fibrillation: Secondary | ICD-10-CM

## 2014-06-19 LAB — BASIC METABOLIC PANEL
BUN: 18 mg/dL (ref 6–23)
CO2: 28 mEq/L (ref 19–32)
Calcium: 8.4 mg/dL (ref 8.4–10.5)
Chloride: 111 mEq/L (ref 96–112)
Creatinine, Ser: 1.6 mg/dL — ABNORMAL HIGH (ref 0.4–1.2)
GFR: 34.34 mL/min — ABNORMAL LOW (ref >60.00)
Glucose, Bld: 84 mg/dL (ref 70–99)
Potassium: 4.4 mEq/L (ref 3.5–5.1)
Sodium: 142 mEq/L (ref 135–145)

## 2014-06-19 LAB — HEPATIC FUNCTION PANEL
ALT: 41 U/L — ABNORMAL HIGH (ref 0–35)
AST: 55 U/L — ABNORMAL HIGH (ref 0–37)
Albumin: 2.3 g/dL — ABNORMAL LOW (ref 3.5–5.2)
Alkaline Phosphatase: 116 U/L (ref 39–117)
Bilirubin, Direct: 0.2 mg/dL (ref 0.0–0.3)
Total Bilirubin: 0.7 mg/dL (ref 0.2–1.2)
Total Protein: 6.2 g/dL (ref 6.0–8.3)

## 2014-06-19 LAB — CBC
HCT: 37.3 % (ref 36.0–46.0)
Hemoglobin: 12.2 g/dL (ref 12.0–15.0)
MCHC: 32.7 g/dL (ref 30.0–36.0)
MCV: 92 fl (ref 78.0–100.0)
Platelets: 101 10*3/uL — ABNORMAL LOW (ref 150.0–400.0)
RBC: 4.06 Mil/uL (ref 3.87–5.11)
RDW: 15 % (ref 11.5–15.5)
WBC: 7.9 10*3/uL (ref 4.0–10.5)

## 2014-06-19 LAB — SEDIMENTATION RATE: Sed Rate: 20 mm/hr (ref 0–22)

## 2014-06-19 NOTE — Progress Notes (Signed)
[name] Date of Birth: January 04, 1934 Medical Record #[id number]  History of Present Illness: [name] is seen back today for a post hospital visit - seen for Dr. Mare Ferrari and Dr Rayann Heman She is a 80 year old female with tachy/brady and has PPM in place since 2011. Other issues include remote history a repair of a PDA. Has moderate AS, diastolic HF, PAF, HLD, hypothyroidism and HTN. She remains on chronic coumadin therapy.   Seen here in the office at the end of August - felt to be doing ok.  Most recently in the hospital at the end of September - had presented with weakness and near syncope. Found to be dehydrated - LFTS elevated. Amiodarone was stopped. HCTZ taken out of her ARB. Now back on her Lasix but just every other day. Remains off of amiodarone but still on statin and Tylenol. Echo showed normal EF and just mild AS.  Comes in today. Here with her daughter, [name]. [name] is feeling better. No chest pain. Breathing stable. Swelling stable. Has considerable discoloration of her right lower leg - apparently has been this way since her last knee surgery from June. Was told that this was from Minocycline - this was stopped last week. Her leg does not hurt. She is not able to walk without the brace. Not using too much Tylenol. No palpitations. No fever or chills. Coumadin level has been ok.   Current Outpatient Prescriptions  Medication Sig Dispense Refill  . acetaminophen (TYLENOL) 500 MG tablet Take 1,000 mg by mouth every 6 (six) hours as needed for moderate pain.      [name] ALPRAZolam (XANAX) 0.5 MG tablet Take 0.5 mg by mouth daily as needed for anxiety.      [name] atenolol (TENORMIN) 100 MG tablet Take 50 mg by mouth 2 (two) times daily.       [name] atorvastatin (LIPITOR) 80 MG tablet Take 80 mg by mouth daily.      . Biotin 5000 MCG CAPS Take 10,000 mcg by mouth daily.       . Cholecalciferol (VITAMIN D PO) Take 5,000 Units by mouth daily.      [name] levothyroxine (SYNTHROID,  LEVOTHROID) 100 MCG tablet Take 100 mcg by mouth daily before breakfast.      . losartan (COZAAR) 50 MG tablet Take 1 tablet (50 mg total) by mouth daily.  30 tablet  0  . Magnesium 250 MG TABS Take 250 mg by mouth daily.       . meclizine (ANTIVERT) 25 MG tablet Take 25 mg by mouth as needed for dizziness or nausea.      . methocarbamol (ROBAXIN) 500 MG tablet Take 500 mg by mouth every 6 (six) hours as needed for muscle spasms.      . nitroGLYCERIN (NITRODUR - DOSED IN MG/24 HR) 0.4 mg/hr patch Place 0.4 mg onto the skin daily as needed (chest pain).      . nitroGLYCERIN (NITROSTAT) 0.4 MG SL tablet Place 0.4 mg under the tongue every 5 (five) minutes as needed for chest pain.      . pregabalin (LYRICA) 75 MG capsule Take 75 mg by mouth 2 (two) times daily.      . ranitidine (ZANTAC) 150 MG tablet Take 150 mg by mouth 2 (two) times daily.       . sertraline (ZOLOFT) 50 MG tablet Take 50 mg by mouth daily.      . traMADol (ULTRAM) 50 MG tablet Take 50 mg  by mouth every 6 (six) hours as needed for moderate pain.       [name] UNABLE TO FIND Lab-Cmet  1 each  0  . warfarin (COUMADIN) 2 MG tablet Take 1-2 mg by mouth daily. 1 mg daily except 2 mg on Saturdays per coag clinic;       No current facility-administered medications for this visit.    Allergies  Allergen Reactions  . Codeine Nausea And Vomiting  . Hydrocodone Other (See Comments)    hallucination  . Morphine And Related Nausea And Vomiting  . Requip [Ropinirole Hcl]     Headache   . Zinc     nausea    Past Medical History  Diagnosis Date  . PAF (paroxysmal atrial fibrillation)     controlled with amiodarone, on coumadin  . CAD (coronary artery disease)   . Aortic root dilatation   . Moderate aortic stenosis   . Chronic anticoagulation   . Tachycardia-bradycardia syndrome     s/p PPM by Dr Doreatha Lew ([facility]) 07/03/10  . GERD (gastroesophageal reflux disease)   . Chronic kidney disease   . Pacemaker     2012  . Neuropathy      feet/legs  . Restless leg syndrome   . Headache(784.0)     hx migraines - takes Zoloft for migraines  . History of uterine cancer 1980s    treated with hysterectomy, external radiation and radiation seed implants.   . Cancer     hx Kidney cancer / hx of endometrial cancer  . PONV (postoperative nausea and vomiting)   . Anxiety   . Heart murmur   . HTN (hypertension)   . Hyperlipemia   . Hypothyroidism   . 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 Osteomyelitis as child  . Family history of anesthesia complication     daughter has ponv  . Syncope 05/28/2014    Past Surgical History  Procedure Laterality Date  . Cardiac catheterization  2008  . Nephrectomy Left     renal cell cancer  . Total abdominal hysterectomy    . Patent ductus arterious repair  age 71  . Ptvp    . Pacemaker insertion  07/04/11    by Dr Doreatha Lew  . Cholecystectomy  1970s  . Upper gastrointestinal endoscopy    . Total knee arthroplasty  02/07/2012    Procedure: TOTAL KNEE ARTHROPLASTY;  Surgeon: Mauri Pole, MD;  Location: [facility];  Service: Orthopedics;  Laterality: Right;  . Insert / replace / remove pacemaker    . Joint replacement      rt total hip 2009  . I&d knee with poly exchange Right 12/17/2013    Procedure: IRRIGATION AND DEBRIDEMEN RIGHT TOTAL  KNEE WITH POLY EXCHANGE;  Surgeon: Mauri Pole, MD;  Location: [facility];  Service: Orthopedics;  Laterality: Right;  . Patellar tendon repair Right 03/06/2014    Procedure: AVULSION WITH PATELLA TENDON REPAIR;  Surgeon: Mauri Pole, MD;  Location: [facility];  Service: Orthopedics;  Laterality: Right;    History  Smoking status  . Never Smoker   Smokeless tobacco  . Never Used    History  Alcohol Use No    Family History  Problem Relation Age of Onset  . Colon cancer Mother 50  . Heart disease Mother   . Heart disease Father   . Heart attack Father   . Heart disease Maternal Grandmother     Review of Systems: The review of systems is per the HPI.  All other systems were reviewed and are negative.  Physical Exam: BP 140/86  Pulse 69  Ht 5\' 1"  (1.549 m)  Wt 154 lb 6.4 oz (70.035 kg)  BMI 29.19 kg/m2 Patient is very pleasant and in no acute distress. Skin is warm and dry. Color is normal.  HEENT is unremarkable. Normocephalic/atraumatic. PERRL. Sclera are nonicteric. Neck is supple. No masses. No JVD. Lungs are clear. Cardiac exam shows a regular rate and rhythm. Abdomen is soft. Extremities are without edema. She has a brace on the right lower leg which was removed - see picture of the right leg. This discoloration is localized to the right leg only. Gait and ROM are intact. No gross neurologic deficits noted.  Wt Readings from Last 3 Encounters:  06/19/14 154 lb 6.4 oz (70.035 kg)  06/10/14 157 lb 3.2 oz (71.305 kg)  06/04/14 161 lb 12.8 oz (73.392 kg)      LABORATORY DATA/PROCEDURES: EKG today shows A pacing with underlying sinus rhythm.   Lab Results  Component Value Date   WBC 6.8 06/04/2014   HGB 13.1 06/04/2014   HCT 39.8 06/04/2014   PLT 98* 06/04/2014   GLUCOSE 78 06/10/2014   CHOL 87 02/15/2014   TRIG 102 02/15/2014   HDL 30* 02/15/2014   LDLCALC 37 02/15/2014   ALT 90* 06/04/2014   AST 110* 06/04/2014   NA 141 06/10/2014   K 4.1 06/10/2014   CL 107 06/10/2014   CREATININE 1.40* 06/10/2014   BUN 23 06/10/2014   CO2 24 06/10/2014   TSH 0.540 05/28/2014   INR 3.0 06/11/2014   HGBA1C 5.6 02/15/2014   MICROALBUR 0.50 09/24/2013    Lab Results  Component Value Date   INR 3.0 06/11/2014   INR 2.9 06/06/2014   INR 2.48* 05/31/2014     BNP (last 3 results) No results found for this basename: PROBNP,  in the last 8760 hours  Echo Study Conclusions from September 2015 - Left ventricle: The cavity size was normal. Wall thickness was increased in a pattern of moderate LVH. There was mild focal basal hypertrophy of the septum. Systolic function was normal. The estimated ejection fraction was in the range of 55% to 60%. - Aortic  valve: There was mild stenosis. There was moderate regurgitation. Valve area (VTI): 1.08 cm^2. Valve area (Vmax): 0.98 cm^2. Valve area (Vmean): 1.03 cm^2. - Mitral valve: Calcified annulus. - Left atrium: The atrium was mildly dilated. - Atrial septum: No defect or patent foramen ovale was identified. - Pulmonary arteries: PA peak pressure: 32 mm Hg (S). - Impressions: No residual PDA flow seen on limited suprasternal images.  Impressions:  - No residual PDA flow seen on limited suprasternal images.   Assessment / Plan: 1. Recent admission for weakness - found to have increased LFTs - remains off of amiodarone - I have stopped her Tylenol and Lipitor today as well - rechecking labs.   2. Right leg discoloration - she has palpable pulses in both feet - no pain. Both feet are warm to touch. She has no other discolored areas on her body. She has no actual skin breakdown on the right leg as well. I am not sure what to make of this. Do not feel we need a doppler since she has palpable pulses and is therapeutic on her coumadin.   3. PAF - remains in sinus. I have NOT restarted the amiodarone at this time.   4. HLD - on statin - I have stopped this today as  well.   See back in about 3 weeks with Dr. Mare Ferrari. Labs today.   Patient is agreeable to this plan and will call if any problems develop in the interim.   Burtis Junes, RN, [place] [address] [place], [place]  [id number] [phone]

## 2014-06-19 NOTE — Patient Instructions (Addendum)
We will be checking the following labs today CBC, BMET, HPF and sed rate  Stay on your current medicines but STOP Lipitor and STOP Tylenol  Stay off Amiodarone  See Cecille Rubin & Dr. Mare Ferrari in about 2 to 3 weeks  Call the [place] office at [phone] if you have any questions, problems or concerns.

## 2014-06-21 ENCOUNTER — Ambulatory Visit (INDEPENDENT_AMBULATORY_CARE_PROVIDER_SITE_OTHER): Payer: Medicare Other | Admitting: Cardiology

## 2014-06-21 DIAGNOSIS — I4891 Unspecified atrial fibrillation: Secondary | ICD-10-CM

## 2014-06-21 LAB — POCT INR: INR: 1.3

## 2014-06-27 ENCOUNTER — Encounter: Payer: Self-pay | Admitting: *Deleted

## 2014-06-28 ENCOUNTER — Ambulatory Visit (INDEPENDENT_AMBULATORY_CARE_PROVIDER_SITE_OTHER): Payer: Medicare Other | Admitting: Pharmacist

## 2014-06-28 DIAGNOSIS — I4891 Unspecified atrial fibrillation: Secondary | ICD-10-CM

## 2014-06-28 LAB — POCT INR: INR: 1

## 2014-07-01 ENCOUNTER — Encounter: Payer: Self-pay | Admitting: Nurse Practitioner

## 2014-07-01 ENCOUNTER — Ambulatory Visit (INDEPENDENT_AMBULATORY_CARE_PROVIDER_SITE_OTHER): Payer: Medicare Other | Admitting: Nurse Practitioner

## 2014-07-01 VITALS — BP 180/70 | HR 60 | Ht 60.0 in | Wt 155.0 lb

## 2014-07-01 DIAGNOSIS — Z950 Presence of cardiac pacemaker: Secondary | ICD-10-CM

## 2014-07-01 DIAGNOSIS — R945 Abnormal results of liver function studies: Secondary | ICD-10-CM

## 2014-07-01 DIAGNOSIS — Z79899 Other long term (current) drug therapy: Secondary | ICD-10-CM

## 2014-07-01 DIAGNOSIS — I480 Paroxysmal atrial fibrillation: Secondary | ICD-10-CM

## 2014-07-01 DIAGNOSIS — R7989 Other specified abnormal findings of blood chemistry: Secondary | ICD-10-CM

## 2014-07-01 LAB — BASIC METABOLIC PANEL
BUN: 12 mg/dL (ref 6–23)
CO2: 27 mEq/L (ref 19–32)
Calcium: 8.8 mg/dL (ref 8.4–10.5)
Chloride: 109 mEq/L (ref 96–112)
Creatinine, Ser: 1.2 mg/dL (ref 0.4–1.2)
GFR: 44.42 mL/min — ABNORMAL LOW (ref >60.00)
Glucose, Bld: 98 mg/dL (ref 70–99)
Potassium: 3.7 mEq/L (ref 3.5–5.1)
Sodium: 141 mEq/L (ref 135–145)

## 2014-07-01 LAB — HEPATIC FUNCTION PANEL
ALT: 27 U/L (ref 0–35)
AST: 45 U/L — ABNORMAL HIGH (ref 0–37)
Albumin: 2.6 g/dL — ABNORMAL LOW (ref 3.5–5.2)
Alkaline Phosphatase: 85 U/L (ref 39–117)
Bilirubin, Direct: 0.1 mg/dL (ref 0.0–0.3)
Total Bilirubin: 0.7 mg/dL (ref 0.2–1.2)
Total Protein: 6.6 g/dL (ref 6.0–8.3)

## 2014-07-01 MED ORDER — LOSARTAN POTASSIUM 100 MG PO TABS: 100.0000 mg | ORAL_TABLET | Freq: Every day | ORAL | Status: DC

## 2014-07-01 NOTE — Progress Notes (Signed)
[name] Date of Birth: 13-Sep-1933 Medical Record #[id number]  History of Present Illness: [name] is seen back today for a follow up visit - seen for Dr. Mare Ferrari and Dr Rayann Heman She is a 80 year old female with tachy/brady and has PPM in place since 2011. Other issues include remote history a repair of a PDA. Has moderate AS, diastolic HF, PAF, HLD, hypothyroidism and HTN. She remains on chronic coumadin therapy.   Seen here in the office at the end of August - felt to be doing ok.   Most recently in the hospital at the end of September - had presented with weakness and near syncope. Found to be dehydrated - LFTS elevated. Amiodarone was stopped. HCTZ taken out of her ARB. Now back on her Lasix but just every other day. Remains off of amiodarone but still on statin and Tylenol. Echo showed normal EF and just mild AS.   I saw her about 3 weeks ago for her post hospital check - she was improved clinically - I stopped her statin at that visit as well, rechecked her labs. She had considerable discoloration of her right lower leg - apparently has been this way since her last knee surgery from this past June. Was told that this was from Minocycline which had been stopped.    Comes in today. Here with both of her daughters. BP is up today. Leg is still discolored. Family concerned about proceeding on with more ortho surgery. They have lots of questions concerning her orthopedic issues. Patient feels fine. BP is up - not checking at home. No palpitations. Looks stronger. Labs pending.   Current Outpatient Prescriptions  Medication Sig Dispense Refill  . ALPRAZolam (XANAX) 0.5 MG tablet Take 0.5 mg by mouth daily as needed for anxiety.      [name] atenolol (TENORMIN) 100 MG tablet Take 50 mg by mouth 2 (two) times daily.       . Biotin 5000 MCG CAPS Take 10,000 mcg by mouth daily.       . Cholecalciferol (VITAMIN D PO) Take 5,000 Units by mouth daily.      [name] levothyroxine (SYNTHROID, LEVOTHROID)  100 MCG tablet Take 100 mcg by mouth daily before breakfast.      . Magnesium 250 MG TABS Take 250 mg by mouth daily.       . meclizine (ANTIVERT) 25 MG tablet Take 25 mg by mouth as needed for dizziness or nausea.      . methocarbamol (ROBAXIN) 500 MG tablet Take 500 mg by mouth every 6 (six) hours as needed for muscle spasms.      . nitroGLYCERIN (NITRODUR - DOSED IN MG/24 HR) 0.4 mg/hr patch Place 0.4 mg onto the skin daily as needed (chest pain).      . nitroGLYCERIN (NITROSTAT) 0.4 MG SL tablet Place 0.4 mg under the tongue every 5 (five) minutes as needed for chest pain.      . pregabalin (LYRICA) 75 MG capsule Take 75 mg by mouth 2 (two) times daily.      . ranitidine (ZANTAC) 150 MG tablet Take 150 mg by mouth 2 (two) times daily.       . sertraline (ZOLOFT) 50 MG tablet Take 50 mg by mouth daily.      . traMADol (ULTRAM) 50 MG tablet Take 50 mg by mouth every 6 (six) hours as needed for moderate pain.       [name] warfarin (COUMADIN) 2 MG tablet Take 1-2 mg by  mouth daily. 1 mg daily except 2 mg on Saturdays per coag clinic;      . losartan (COZAAR) 100 MG tablet Take 1 tablet (100 mg total) by mouth daily.  90 tablet  3   No current facility-administered medications for this visit.    Allergies  Allergen Reactions  . Codeine Nausea And Vomiting  . Hydrocodone Other (See Comments)    hallucination  . Morphine And Related Nausea And Vomiting  . Requip [Ropinirole Hcl]     Headache   . Zinc     nausea    Past Medical History  Diagnosis Date  . PAF (paroxysmal atrial fibrillation)     controlled with amiodarone, on coumadin  . CAD (coronary artery disease)   . Aortic root dilatation   . Moderate aortic stenosis   . Chronic anticoagulation   . Tachycardia-bradycardia syndrome     s/p PPM by Dr Doreatha Lew ([facility]T) 07/03/10  . GERD (gastroesophageal reflux disease)   . Chronic kidney disease   . Pacemaker     2012  . Neuropathy     feet/legs  . Restless leg syndrome   .  Headache(784.0)     hx migraines - takes Zoloft for migraines  . History of uterine cancer 1980s    treated with hysterectomy, external radiation and radiation seed implants.   . Cancer     hx Kidney cancer / hx of endometrial cancer  . PONV (postoperative nausea and vomiting)   . Anxiety   . Heart murmur   . HTN (hypertension)   . Hyperlipemia   . Hypothyroidism   . 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 Osteomyelitis as child  . Family history of anesthesia complication     daughter has ponv  . Syncope 05/28/2014  . Hepatic steatosis   . Elevated LFTs   . Gastroparesis   . Anal fissure   . Radiation proctitis   . Gastric polyp     adenomatous    Past Surgical History  Procedure Laterality Date  . Cardiac catheterization  2008  . Nephrectomy Left     renal cell cancer  . Total abdominal hysterectomy    . Patent ductus arterious repair  age 62  . Ptvp    . Pacemaker insertion  07/04/11    by Dr Doreatha Lew  . Cholecystectomy  1970s  . Upper gastrointestinal endoscopy    . Total knee arthroplasty  02/07/2012    Procedure: TOTAL KNEE ARTHROPLASTY;  Surgeon: Mauri Pole, MD;  Location: [facility];  Service: Orthopedics;  Laterality: Right;  . Insert / replace / remove pacemaker    . Joint replacement      rt total hip 2009  . I&d knee with poly exchange Right 12/17/2013    Procedure: IRRIGATION AND DEBRIDEMEN RIGHT TOTAL  KNEE WITH POLY EXCHANGE;  Surgeon: Mauri Pole, MD;  Location: [facility];  Service: Orthopedics;  Laterality: Right;  . Patellar tendon repair Right 03/06/2014    Procedure: AVULSION WITH PATELLA TENDON REPAIR;  Surgeon: Mauri Pole, MD;  Location: [facility];  Service: Orthopedics;  Laterality: Right;    History  Smoking status  . Never Smoker   Smokeless tobacco  . Never Used    History  Alcohol Use No    Family History  Problem Relation Age of Onset  . Colon cancer Mother 32  . Heart disease Mother   . Heart disease Father   . Heart attack Father   . Heart  disease  Maternal Grandmother     Review of Systems: The review of systems is per the HPI.  All other systems were reviewed and are negative.  Physical Exam: BP 180/70  Pulse 60  Ht 5' (1.524 m)  Wt 155 lb (70.308 kg)  BMI 30.27 kg/m2 BP by me is the same.  Patient is very pleasant and in no acute distress. Skin is warm and dry. Color is normal.  HEENT is unremarkable. Normocephalic/atraumatic. PERRL. Sclera are nonicteric. Neck is supple. No masses. No JVD. Lungs are clear. Cardiac exam shows a regular rate and rhythm today. Abdomen is soft. Extremities are without edema. Her lower right leg remains discolored - really no different from her last visit with me. Gait not tested. ROM intact. No gross neurologic deficits noted.  Wt Readings from Last 3 Encounters:  07/01/14 155 lb (70.308 kg)  06/19/14 154 lb 6.4 oz (70.035 kg)  06/10/14 157 lb 3.2 oz (71.305 kg)    LABORATORY DATA/PROCEDURES:  Lab Results  Component Value Date   WBC 7.9 06/19/2014   HGB 12.2 06/19/2014   HCT 37.3 06/19/2014   PLT 101.0* 06/19/2014   GLUCOSE 84 06/19/2014   CHOL 87 02/15/2014   TRIG 102 02/15/2014   HDL 30* 02/15/2014   LDLCALC 37 02/15/2014   ALT 41* 06/19/2014   AST 55* 06/19/2014   NA 142 06/19/2014   K 4.4 06/19/2014   CL 111 06/19/2014   CREATININE 1.6* 06/19/2014   BUN 18 06/19/2014   CO2 28 06/19/2014   TSH 0.540 05/28/2014   INR 1.0 06/28/2014   HGBA1C 5.6 02/15/2014   MICROALBUR 0.50 09/24/2013    BNP (last 3 results) No results found for this basename: PROBNP,  in the last 8760 hours  Echo Study Conclusions from September 2015 - Left ventricle: The cavity size was normal. Wall thickness was increased in a pattern of moderate LVH. There was mild focal basal hypertrophy of the septum. Systolic function was normal. The estimated ejection fraction was in the range of 55% to 60%. - Aortic valve: There was mild stenosis. There was moderate regurgitation. Valve area (VTI): 1.08 cm^2.  Valve area (Vmax): 0.98 cm^2. Valve area (Vmean): 1.03 cm^2. - Mitral valve: Calcified annulus. - Left atrium: The atrium was mildly dilated. - Atrial septum: No defect or patent foramen ovale was identified. - Pulmonary arteries: PA peak pressure: 32 mm Hg (S). - Impressions: No residual PDA flow seen on limited suprasternal images.  Impressions:  - No residual PDA flow seen on limited suprasternal images.  Assessment / Plan:  1. Recent admission for weakness - found to have increased LFTs - remains off of amiodarone - also off of her Tylenol and Lipitor as well - rechecking labs today. Will leave off of these medicines.   2. Right leg discoloration - she still has palpable pulses in both feet - no pain. Both feet are warm to touch. She has no other discolored areas on her body. She has no actual skin breakdown on the right leg as well. I still do not know what to make of this. Seen by Dr. Mare Ferrari as well - will defer back to ortho.   3. PAF - remains in sinus. I have NOT restarted the amiodarone at this time.   4. HLD - off statin   5. HTN - Losartan is increased today. Have asked family to monitor.   See Dr. Mare Ferrari in a couple of months. Labs today. Have encouraged her family to get back  to Dr. Alvan Dame to discuss their concerns.   Patient is agreeable to this plan and will call if any problems develop in the interim.   Burtis Junes, RN, [facility] [address] [place], [place]  [phone]

## 2014-07-01 NOTE — Patient Instructions (Signed)
We will be checking the following labs today BMET and LFTS  See Dr. Mare Ferrari in 3 months  Increase the Losartan to 100 mg a day  Monitor the BP at home  Call the [place] office at [phone] if you have any questions, problems or concerns.

## 2014-07-05 ENCOUNTER — Encounter: Payer: Self-pay | Admitting: Gastroenterology

## 2014-07-05 ENCOUNTER — Telehealth: Payer: Self-pay | Admitting: Cardiology

## 2014-07-05 ENCOUNTER — Ambulatory Visit (INDEPENDENT_AMBULATORY_CARE_PROVIDER_SITE_OTHER): Payer: Medicare Other | Admitting: Gastroenterology

## 2014-07-05 ENCOUNTER — Ambulatory Visit (INDEPENDENT_AMBULATORY_CARE_PROVIDER_SITE_OTHER): Payer: Medicare Other | Admitting: Internal Medicine

## 2014-07-05 ENCOUNTER — Other Ambulatory Visit: Payer: Medicare Other

## 2014-07-05 VITALS — BP 152/78 | HR 76 | Ht 61.0 in | Wt 154.0 lb

## 2014-07-05 DIAGNOSIS — R7989 Other specified abnormal findings of blood chemistry: Secondary | ICD-10-CM

## 2014-07-05 DIAGNOSIS — I480 Paroxysmal atrial fibrillation: Secondary | ICD-10-CM

## 2014-07-05 DIAGNOSIS — R945 Abnormal results of liver function studies: Secondary | ICD-10-CM

## 2014-07-05 DIAGNOSIS — R197 Diarrhea, unspecified: Secondary | ICD-10-CM

## 2014-07-05 LAB — POCT INR: INR: 1.1

## 2014-07-05 MED ORDER — CHOLESTYRAMINE 4 G PO PACK: PACK | ORAL | Status: DC

## 2014-07-05 NOTE — Telephone Encounter (Signed)
New Prob    Calling to report most recent PTINR. Please call.

## 2014-07-05 NOTE — Telephone Encounter (Signed)
See Anti-coag note  

## 2014-07-05 NOTE — Patient Instructions (Signed)
Your physician has requested that you go to the basement for the following lab work before leaving today:  C Diff  We have sent the following medications to your pharmacy for you to pick up at your convenience:  [place] with your primary care doctor the possibility of discontinuing Magnesium   Please follow up with Jessica on 08/05/2014 at 9:30am

## 2014-07-05 NOTE — Telephone Encounter (Signed)
New Prob     Calling to report most recent PTINR. Please call.

## 2014-07-05 NOTE — Progress Notes (Signed)
     07/05/2014 MARK BENECKE [id number] [birth year]   History of Present Illness:  This is a 80 year old female who is here for hospital follow-up.  She has multiple medical problems but was recently hospitalized for a syncopal spell due to orthostasis.  LFT's were elevated so GI was consulted.  Ultrasound showed 14 mm CBD without obvious stone or obstruction. Fatty liver or "other diffuse hepatocellular disease" also noted.  Acute viral hepatitis screen turned up positive HCV AB, but viral load was negative.  CT scan was ordered for further evaluation of abnormal ultrasound imaging and indicated that the finding appeared benign, most likely related to post-cholecystectomy state and age related.  LFT's have normalized to AST 45, ALT 27, ALP 85, and total bili 0.7.  The highest levels were AST 194, ALT 121, ALP 194, and total bili remained normal throughout.  ? If this elevation was somewhat related to low flow state with orthostasis or some of her medications that have now been adjusted.  She is here today with her two daughters.  Says that she feels well.  Does complain of diarrhea that has been present for years, but maybe has worsened recently.  She was on antibiotics for 6 months (cipro, minocycline, and others).  Stool culture and O&P were negative, but no Cdiff was checked.  Her daughters say that she goes 8-10 times per day, sometimes it is just like water.  Takes 1/2 of an Imodium every other day but does not really seem to help at all.  Last colonoscopy was 08/2011 by Dr. Deatra Ina at which time she was found to have diverticulosis only.  Just of note, she does take magnesium daily.     Current Medications, Allergies, Past Medical History, Past Surgical History, Family History and Social History were reviewed in [employer] record.   Physical Exam: BP 152/78  Pulse 76  Ht 5\' 1"  (1.549 m)  Wt 154 lb (69.854 kg)  BMI 29.11 kg/m2 General: Well developed white female  in no acute distress Head: Normocephalic and atraumatic Eyes:  Sclerae anicteric, conjunctiva pink  Ears: Normal auditory acuity Lungs: Clear throughout to auscultation Heart: Regular rate and rhythm Abdomen:  Soft, non-distended.  BS present.  Non-tender. Musculoskeletal: Symmetrical with no gross deformities  Extremities: No edema  Neurological: Alert oriented x 4, grossly non-focal Psychological:  Alert and cooperative. Normal mood and affect  Assessment and Recommendations: -Elevated LFT's with abnormal biliary imaging:  LFT's normalized.  CT scan indicating that biliary changes likely due to combination of post-cholecystectomy state and patient's age. -Diarrhea:  Chronic, but maybe worsened recently.  Was on antibiotics for 6 months.  Will check stool for Cdiff.  Diarrhea is likely multifactorial due to the magnesium that she is taking and also possibly bile salt related diarrhea s/p cholecystectomy.  I have asked her to speak with her PCP about possibly stopping the magnesium if possible.  Will try questran on packet daily at lunch to start.  Follow-up in 4-6 weeks.

## 2014-07-05 NOTE — Progress Notes (Signed)
Reviewed and agree with management. Emillio Ngo D. Heran Campau, M.D., FACG  

## 2014-07-08 ENCOUNTER — Telehealth: Payer: Self-pay | Admitting: General Practice

## 2014-07-08 NOTE — Telephone Encounter (Signed)
Will forward to  Dr. Brackbill for review 

## 2014-07-08 NOTE — Telephone Encounter (Signed)
New Message       Pt's daughter calling stating pt is having surgery 07/29/14 and needs to know when she should stop Warfarin. Please call back to advise.

## 2014-07-09 NOTE — Telephone Encounter (Signed)
Take last dose of warfarin on 07/25/14 and arrange lovenox bridging with anticoagulation clinic

## 2014-07-09 NOTE — Telephone Encounter (Signed)
Advised daughter [name] Will forward anticoagulation clinic

## 2014-07-09 NOTE — Telephone Encounter (Signed)
Called pt's daughter [name] made appt in Coumadin clinic for 07/24/14 to arrange Lovenox bridging prior to surgery on 07/29/14.  Pt's daughter aware of appt date and time.

## 2014-07-10 ENCOUNTER — Other Ambulatory Visit: Payer: Medicare Other

## 2014-07-10 DIAGNOSIS — R197 Diarrhea, unspecified: Secondary | ICD-10-CM

## 2014-07-11 LAB — CLOSTRIDIUM DIFFICILE BY PCR: CDIFFPCR: NOT DETECTED

## 2014-07-12 ENCOUNTER — Ambulatory Visit (INDEPENDENT_AMBULATORY_CARE_PROVIDER_SITE_OTHER): Payer: Medicare Other | Admitting: Pharmacist

## 2014-07-12 DIAGNOSIS — I480 Paroxysmal atrial fibrillation: Secondary | ICD-10-CM

## 2014-07-12 LAB — POCT INR: INR: 1.1

## 2014-07-18 ENCOUNTER — Telehealth: Payer: Self-pay | Admitting: Cardiology

## 2014-07-18 NOTE — Telephone Encounter (Signed)
Advised daughter form received and will have  Dr. Mare Ferrari sign tomorrow and fax back

## 2014-07-18 NOTE — Telephone Encounter (Signed)
New message     Daughter calling surgery on  11/23 . Need cardiac clearance   office # [phone] . Attention Elliot Cousin.

## 2014-07-19 ENCOUNTER — Telehealth: Payer: Self-pay | Admitting: Cardiology

## 2014-07-19 ENCOUNTER — Ambulatory Visit (INDEPENDENT_AMBULATORY_CARE_PROVIDER_SITE_OTHER): Payer: Medicare Other | Admitting: Cardiology

## 2014-07-19 DIAGNOSIS — I480 Paroxysmal atrial fibrillation: Secondary | ICD-10-CM

## 2014-07-19 LAB — POCT INR: INR: 2.5

## 2014-07-19 NOTE — Patient Instructions (Addendum)
ROSLYNN HOLTE  07/19/2014   Your procedure is scheduled on: 07/29/2014     Come thru the [place] Entrance. .  Follow the Signs to [place] at    1110pm  Call this number if you have problems the morning of surgery: [phone]   Remember:   Do not eat food after midnite.  You may have clear liquids until 0730am then nothing by mouth.    Take these medicines the morning of surgery with A SIP OF WATER:  Xanax if needed, Atenolol ( Tenormin) , Levothyroxine ( synthroid), Zantac    Do not wear jewelry, make-up or nail polish.  Do not wear lotions, powders, or perfumes.  deodorant.  Do not shave 48 hours prior to surgery.  Do not bring valuables to the hospital.  Contacts, dentures or bridgework may not be worn into surgery.  Leave suitcase in the car. After surgery it may be brought to your room.  For patients admitted to the hospital, checkout time is 11:00 AM the day of  discharge.    Please read over the following fact sheets that you were given: MRSA Information, coughing and deep breathing exercises, leg exercises               CLEAR LIQUID DIET   Foods Allowed                                                                     Foods Excluded  Coffee and tea, regular and decaf                             liquids that you cannot  Plain Jell-O in any flavor                                             see through such as: Fruit ices (not with fruit pulp)                                     milk, soups, orange juice  Iced Popsicles                                    All solid food Carbonated beverages, regular and diet                                    Cranberry, grape and apple juices Sports drinks like Gatorade Lightly seasoned clear broth or consume(fat free) Sugar, honey syrup  Sample Menu Breakfast                                Lunch  Supper Cranberry juice                    Beef broth                             Chicken broth Jell-O                                     Grape juice                           Apple juice Coffee or tea                        Jell-O                                      Popsicle                                                Coffee or tea                        Coffee or tea  _____________________________________________________________________  [facility] - Preparing for Surgery Before surgery, you can play an important role.  Because skin is not sterile, your skin needs to be as free of germs as possible.  You can reduce the number of germs on your skin by washing with CHG (chlorahexidine gluconate) soap before surgery.  CHG is an antiseptic cleaner which kills germs and bonds with the skin to continue killing germs even after washing. Please DO NOT use if you have an allergy to CHG or antibacterial soaps.  If your skin becomes reddened/irritated stop using the CHG and inform your nurse when you arrive at Short Stay. Do not shave (including legs and underarms) for at least 48 hours prior to the first CHG shower.  You may shave your face/neck. Please follow these instructions carefully:  1.  Shower with CHG Soap the night before surgery and the  morning of Surgery.  2.  If you choose to wash your hair, wash your hair first as usual with your  normal  shampoo.  3.  After you shampoo, rinse your hair and body thoroughly to remove the  shampoo.                           4.  Use CHG as you would any other liquid soap.  You can apply chg directly  to the skin and wash                       Gently with a scrungie or clean washcloth.  5.  Apply the CHG Soap to your body ONLY FROM THE NECK DOWN.   Do not use on face/ open                           Wound or open sores. Avoid contact with eyes, ears mouth and genitals (private parts).  Wash face,  Genitals (private parts) with your normal soap.             6.  Wash thoroughly, paying special attention to the area  where your surgery  will be performed.  7.  Thoroughly rinse your body with warm water from the neck down.  8.  DO NOT shower/wash with your normal soap after using and rinsing off  the CHG Soap.                9.  Pat yourself dry with a clean towel.            10.  Wear clean pajamas.            11.  Place clean sheets on your bed the night of your first shower and do not  sleep with pets. Day of Surgery : Do not apply any lotions/deodorants the morning of surgery.  Please wear clean clothes to the hospital/surgery center.  FAILURE TO FOLLOW THESE INSTRUCTIONS MAY RESULT IN THE CANCELLATION OF YOUR SURGERY PATIENT SIGNATURE_________________________________  NURSE SIGNATURE__________________________________  ________________________________________________________________________   Adam Phenix  An incentive spirometer is a tool that can help keep your lungs clear and active. This tool measures how well you are filling your lungs with each breath. Taking long deep breaths may help reverse or decrease the chance of developing breathing (pulmonary) problems (especially infection) following:  A long period of time when you are unable to move or be active. BEFORE THE PROCEDURE   If the spirometer includes an indicator to show your best effort, your nurse or respiratory therapist will set it to a desired goal.  If possible, sit up straight or lean slightly forward. Try not to slouch.  Hold the incentive spirometer in an upright position. INSTRUCTIONS FOR USE  1. Sit on the edge of your bed if possible, or sit up as far as you can in bed or on a chair. 2. Hold the incentive spirometer in an upright position. 3. Breathe out normally. 4. Place the mouthpiece in your mouth and seal your lips tightly around it. 5. Breathe in slowly and as deeply as possible, raising the piston or the ball toward the top of the column. 6. Hold your breath for 3-5 seconds or for as long as possible.  Allow the piston or ball to fall to the bottom of the column. 7. Remove the mouthpiece from your mouth and breathe out normally. 8. Rest for a few seconds and repeat Steps 1 through 7 at least 10 times every 1-2 hours when you are awake. Take your time and take a few normal breaths between deep breaths. 9. The spirometer may include an indicator to show your best effort. Use the indicator as a goal to work toward during each repetition. 10. After each set of 10 deep breaths, practice coughing to be sure your lungs are clear. If you have an incision (the cut made at the time of surgery), support your incision when coughing by placing a pillow or rolled up towels firmly against it. Once you are able to get out of bed, walk around indoors and cough well. You may stop using the incentive spirometer when instructed by your caregiver.  RISKS AND COMPLICATIONS  Take your time so you do not get dizzy or light-headed.  If you are in pain, you may need to take or ask for pain medication before doing incentive spirometry. It is harder to take a deep breath if you are having  pain. AFTER USE  Rest and breathe slowly and easily.  It can be helpful to keep track of a log of your progress. Your caregiver can provide you with a simple table to help with this. If you are using the spirometer at home, follow these instructions: [place] IF:   You are having difficultly using the spirometer.  You have trouble using the spirometer as often as instructed.  Your pain medication is not giving enough relief while using the spirometer.  You develop fever of 100.5 F (38.1 C) or higher. SEEK IMMEDIATE MEDICAL CARE IF:   You cough up bloody sputum that had not been present before.  You develop fever of 102 F (38.9 C) or greater.  You develop worsening pain at or near the incision site. MAKE SURE YOU:   Understand these instructions.  Will watch your condition.  Will get help right away if you  are not doing well or get worse. Document Released: 01/03/2007 Document Revised: 11/15/2011 Document Reviewed: 03/06/2007 [facility] Patient Information 2014 [place], [place].   ________________________________________________________________________

## 2014-07-19 NOTE — Telephone Encounter (Signed)
New message           Calling in pt INR results / INR 2.5 & PT 24.3

## 2014-07-19 NOTE — Telephone Encounter (Signed)
See Anti-coag note  

## 2014-07-22 ENCOUNTER — Encounter (HOSPITAL_COMMUNITY)
Admission: RE | Admit: 2014-07-22 | Discharge: 2014-07-22 | Disposition: A | Discharge status: Home / Self Care | Payer: Medicare Other | Source: Ambulatory Visit | Attending: Orthopedic Surgery | Admitting: Orthopedic Surgery

## 2014-07-22 ENCOUNTER — Other Ambulatory Visit: Payer: Self-pay | Admitting: *Deleted

## 2014-07-22 ENCOUNTER — Encounter (HOSPITAL_COMMUNITY): Payer: Self-pay | Admitting: *Deleted

## 2014-07-22 ENCOUNTER — Other Ambulatory Visit (HOSPITAL_COMMUNITY): Payer: Self-pay | Admitting: *Deleted

## 2014-07-22 DIAGNOSIS — Z01812 Encounter for preprocedural laboratory examination: Secondary | ICD-10-CM | POA: Diagnosis not present

## 2014-07-22 LAB — CBC
HCT: 37.3 % (ref 36.0–46.0)
Hemoglobin: 12.1 g/dL (ref 12.0–15.0)
MCH: 29.7 pg (ref 26.0–34.0)
MCHC: 32.4 g/dL (ref 30.0–36.0)
MCV: 91.4 fL (ref 78.0–100.0)
PLATELETS: 197 10*3/uL (ref 150–400)
RBC: 4.08 MIL/uL (ref 3.87–5.11)
RDW: 15.4 % (ref 11.5–15.5)
WBC: 5.6 10*3/uL (ref 4.0–10.5)

## 2014-07-22 LAB — BASIC METABOLIC PANEL
Anion gap: 15 (ref 5–15)
BUN: 17 mg/dL (ref 6–23)
CALCIUM: 9.4 mg/dL (ref 8.4–10.5)
CO2: 22 mEq/L (ref 19–32)
Chloride: 107 mEq/L (ref 96–112)
Creatinine, Ser: 0.99 mg/dL (ref 0.50–1.10)
GFR, EST AFRICAN AMERICAN: 62 mL/min — AB (ref >90)
GFR, EST NON AFRICAN AMERICAN: 53 mL/min — AB (ref >90)
GLUCOSE: 103 mg/dL — AB (ref 70–99)
Potassium: 4.2 mEq/L (ref 3.7–5.3)
Sodium: 144 mEq/L (ref 137–147)

## 2014-07-22 LAB — URINALYSIS, ROUTINE W REFLEX MICROSCOPIC
BILIRUBIN URINE: NEGATIVE
GLUCOSE, UA: NEGATIVE mg/dL
HGB URINE DIPSTICK: NEGATIVE
KETONES UR: NEGATIVE mg/dL
Leukocytes, UA: NEGATIVE
Nitrite: NEGATIVE
PROTEIN: NEGATIVE mg/dL
Specific Gravity, Urine: 1.004 — ABNORMAL LOW (ref 1.005–1.030)
UROBILINOGEN UA: 0.2 mg/dL (ref 0.0–1.0)
pH: 5 (ref 5.0–8.0)

## 2014-07-22 LAB — SURGICAL PCR SCREEN
MRSA, PCR: NEGATIVE
Staphylococcus aureus: NEGATIVE

## 2014-07-22 LAB — NO BLOOD PRODUCTS

## 2014-07-22 LAB — APTT: aPTT: 36 seconds (ref 24–37)

## 2014-07-22 LAB — PROTIME-INR
INR: 1.59 — ABNORMAL HIGH (ref 0.00–1.49)
PROTHROMBIN TIME: 19.1 s — AB (ref 11.6–15.2)

## 2014-07-22 MED ORDER — FUROSEMIDE 40 MG PO TABS: 40.0000 mg | ORAL_TABLET | Freq: Every day | ORAL | Status: DC

## 2014-07-22 NOTE — Progress Notes (Signed)
Peri-operative Prescription for implanted Cardiac Device programming orders received from Dr. Rayann Heman and placed on chart.

## 2014-07-22 NOTE — Progress Notes (Addendum)
CXR- 05/08/14 EPIC  EKG- 06/19/14 EPIC  ECHO- 05/29/14 EPIC  LOV- withDr Fransico Him 07/01/2014 EPIC  Last Device check - 05/08/14 EPIC

## 2014-07-22 NOTE — Progress Notes (Signed)
Blood Refusal consent faxed to [facility] at [phone].   Refaxed for 2nd time - Pacer Perioperative Orders to [facility] at [phone].

## 2014-07-22 NOTE — Telephone Encounter (Signed)
Received request from Nurse fax box:   To: [place] Fax number: [phone] Attention: Orson Slick

## 2014-07-24 ENCOUNTER — Ambulatory Visit: Payer: Self-pay | Admitting: Internal Medicine

## 2014-07-24 ENCOUNTER — Ambulatory Visit (INDEPENDENT_AMBULATORY_CARE_PROVIDER_SITE_OTHER): Payer: Medicare Other | Admitting: Pharmacist

## 2014-07-24 DIAGNOSIS — I4891 Unspecified atrial fibrillation: Secondary | ICD-10-CM

## 2014-07-24 LAB — POCT INR: INR: 1.6

## 2014-07-24 NOTE — Patient Instructions (Signed)
11/18- Lovenox 40mg  in PM 11/19- Lovenox 40mg  in AM and PM 11/20- Lovenox 40mg  in AM and PM 11/21- Lovenox 40mg  in AM and PM 11/22- Lovenox 40mg  in AM only.   11/23- Day of Procedure.  Once you are discharged from the hospital, home health with check your INR.  Please have them call our office for dosing instructions.

## 2014-07-28 NOTE — H&P (Signed)
[name] is an 80 y.o. female.    Chief Complaint: Right knee failed extensor mechanism   HPI: Pt is a 80 y.o. female complaining of right knee pain and the inability to straighten the right knee. She had a previous repair of the her patella tendon after it avulsed off of the tibial tubercle.  She had this repaired on 03/06/2014 per Dr. Alvan Dame.  She had originally done well after surgery but recently complaining of the inability to extend her knee.    Pain had continually increased since the beginning. X-rays in the clinic show previous TKA.  Pt has tried various conservative treatments which have failed to alleviate their symptoms. Various options are discussed with the patient.  Risks, benefits and expectations were discussed with the patient.  Risks including but not limited to the risk of anesthesia, blood clots, nerve damage, blood vessel damage, failure of the prosthesis/repair, possibly losing flexion of the knee,  infection and up to and including death.  Patient understand the risks, benefits and expectations and wishes to proceed with surgery.    PCP: [name], MD  D/C Plans:      Home with HHPT  Post-op Meds:       No Rx given  Tranexamic Acid:      To be given - topically  Decadron:      Is to be given  FYI:     Coumadin with branching Lovenox  Tramadol and APAP post-op   PMH: Past Medical History  Diagnosis Date  . PAF (paroxysmal atrial fibrillation)     controlled with amiodarone, on coumadin  . CAD (coronary artery disease)   . Aortic root dilatation   . Moderate aortic stenosis   . Chronic anticoagulation   . Tachycardia-bradycardia syndrome     s/p PPM by Dr Doreatha Lew (MDT) 07/03/10  . GERD (gastroesophageal reflux disease)   . Pacemaker     2012  . Neuropathy     feet/legs  . Restless leg syndrome   . Headache(784.0)     hx migraines - takes Zoloft for migraines  . History of uterine cancer 1980s    treated with hysterectomy, external  radiation and radiation seed implants.   . Cancer     hx Kidney cancer / hx of endometrial cancer  . PONV (postoperative nausea and vomiting)   . Anxiety   . Heart murmur   . HTN (hypertension)   . Hyperlipemia   . Hypothyroidism   . Osteomyelitis as child  . Family history of anesthesia complication     daughter has ponv  . Syncope 05/28/2014  . Hepatic steatosis   . Elevated LFTs   . Gastroparesis   . Anal fissure   . Radiation proctitis   . Gastric polyp     adenomatous  . Neuromuscular disorder     neuropathy in right leg  . Chronic kidney disease     only has one kidney   . 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 Cardiac catheterization  2008  . Nephrectomy Left     renal cell cancer  . Total abdominal hysterectomy    . Patent ductus arterious repair  age 57  . Ptvp    . Pacemaker insertion  07/04/11    by Dr Doreatha Lew  . Cholecystectomy  1970s  . Upper gastrointestinal endoscopy    . Total knee arthroplasty  02/07/2012    Procedure: TOTAL KNEE  ARTHROPLASTY;  Surgeon: Mauri Pole, MD;  Location: [facility];  Service: Orthopedics;  Laterality: Right;  . Insert / replace / remove pacemaker    . Joint replacement      rt total hip 2009  . I&d knee with poly exchange Right 12/17/2013    Procedure: IRRIGATION AND DEBRIDEMEN RIGHT TOTAL  KNEE WITH POLY EXCHANGE;  Surgeon: Mauri Pole, MD;  Location: [facility];  Service: Orthopedics;  Laterality: Right;  . Patellar tendon repair Right 03/06/2014    Procedure: AVULSION WITH PATELLA TENDON REPAIR;  Surgeon: Mauri Pole, MD;  Location: [facility];  Service: Orthopedics;  Laterality: Right;    Social History:  reports that she has never smoked. She has never used smokeless tobacco. She reports that she does not drink alcohol or use illicit drugs.  Allergies:  Allergies  Allergen Reactions  . Codeine Nausea And Vomiting  . Hydrocodone Other (See Comments)    hallucination  .  Morphine And Related Nausea And Vomiting  . Requip [Ropinirole Hcl]     Headache   . Zinc     nausea    Medications: No current facility-administered medications for this encounter.   Current Outpatient Prescriptions  Medication Sig Dispense Refill  . ALPRAZolam (XANAX) 0.5 MG tablet Take 0.5 mg by mouth 3 (three) times daily as needed for anxiety.     [name] atenolol (TENORMIN) 100 MG tablet Take 50 mg by mouth 2 (two) times daily.     . Biotin 5000 MCG CAPS Take 10,000 mcg by mouth every morning.     . Cholecalciferol (VITAMIN D PO) Take 5,000 Units by mouth every morning.     [name] levothyroxine (SYNTHROID, LEVOTHROID) 100 MCG tablet Take 50 mcg by mouth daily before breakfast.     . Loperamide HCl (IMODIUM A-D PO) Take 1 tablet by mouth every morning. Patient takes 1 tablet in the am and .5 tablet in the pm    . losartan (COZAAR) 100 MG tablet Take 1 tablet (100 mg total) by mouth daily. (Patient taking differently: Take 100 mg by mouth every morning. ) 90 tablet 3  . methocarbamol (ROBAXIN) 500 MG tablet Take 500 mg by mouth every 6 (six) hours as needed for muscle spasms.    . nitroGLYCERIN (NITRODUR - DOSED IN MG/24 HR) 0.4 mg/hr patch Place 0.4 mg onto the skin daily as needed (chest pain).    . nitroGLYCERIN (NITROSTAT) 0.4 MG SL tablet Place 0.4 mg under the tongue every 5 (five) minutes as needed for chest pain.    . potassium chloride SA (K-DUR,KLOR-CON) 20 MEQ tablet Take 20 mEq by mouth 2 (two) times daily.    . pregabalin (LYRICA) 75 MG capsule Take 75 mg by mouth 2 (two) times daily.    . ranitidine (ZANTAC) 150 MG tablet Take 150 mg by mouth 2 (two) times daily.     . sertraline (ZOLOFT) 50 MG tablet Take 50 mg by mouth every evening.     . warfarin (COUMADIN) 2 MG tablet Take 1-4 mg by mouth daily. As of 07/22/2014 patient takes 2 mg daily    . cholestyramine (QUESTRAN) 4 G packet Take one dose daily at lunchtime 60 packet 0  . furosemide (LASIX) 40 MG tablet Take 1 tablet (40  mg total) by mouth daily. Take AD (Patient taking differently: Take 40 mg by mouth every other day. Take AD) 90 tablet 0  . Magnesium 250 MG TABS Take 250 mg by mouth daily.     [name]  meclizine (ANTIVERT) 25 MG tablet Take 25 mg by mouth as needed for dizziness or nausea.    . traMADol (ULTRAM) 50 MG tablet Take 50 mg by mouth every 6 (six) hours as needed for moderate pain.         Review of Systems  Constitutional: Negative.   Eyes: Negative.   Respiratory: Negative.   Cardiovascular: Negative.   Gastrointestinal: Positive for heartburn.  Genitourinary: Negative.   Musculoskeletal: Positive for joint pain.  Skin: Negative.   Neurological: Positive for headaches.  Endo/Heme/Allergies: Negative.   Psychiatric/Behavioral: The patient is nervous/anxious.        Physical Exam  Constitutional: She is oriented to person, place, and time. She appears well-developed and well-nourished.  HENT:  Head: Normocephalic and atraumatic.  Eyes: Pupils are equal, round, and reactive to light.  Neck: Neck supple. No JVD present. No tracheal deviation present. No thyromegaly present.  Cardiovascular: Normal rate, regular rhythm and intact distal pulses.   Murmur heard. Respiratory: Effort normal and breath sounds normal. No stridor. No respiratory distress. She has no wheezes.  GI: Soft. There is no tenderness. There is no guarding.  Musculoskeletal:       Right knee: She exhibits decreased range of motion, swelling, deformity, laceration (healed) and abnormal alignment. She exhibits no ecchymosis and no erythema.  Lymphadenopathy:    She has no cervical adenopathy.  Neurological: She is alert and oriented to person, place, and time.  Skin: Skin is warm and dry.  Psychiatric: She has a normal mood and affect.        Assessment/Plan Assessment:  Right knee failed extensor mechanism  Plan: Patient will undergo a right knee revision repair of extensor mechanism on 07/29/2014 per Dr. Alvan Dame at  [facility]. Risks benefits and expectations were discussed with the patient. Patient understand risks, benefits and expectations and wishes to proceed.      West Pugh Emili Mcloughlin   PA-C  07/28/2014, 9:04 PM

## 2014-07-29 ENCOUNTER — Encounter (HOSPITAL_COMMUNITY): Payer: Self-pay | Admitting: *Deleted

## 2014-07-29 ENCOUNTER — Other Ambulatory Visit: Payer: Self-pay | Admitting: Gastroenterology

## 2014-07-29 ENCOUNTER — Inpatient Hospital Stay (HOSPITAL_COMMUNITY)
Admission: RE | Admit: 2014-07-29 | Discharge: 2014-07-31 | DRG: 501 | Disposition: A | Discharge status: Home Health | Payer: Medicare Other | Source: Ambulatory Visit | Attending: Orthopedic Surgery | Admitting: Orthopedic Surgery

## 2014-07-29 ENCOUNTER — Inpatient Hospital Stay (HOSPITAL_COMMUNITY): Payer: Medicare Other | Admitting: Anesthesiology

## 2014-07-29 ENCOUNTER — Encounter (HOSPITAL_COMMUNITY)
Admission: RE | Disposition: A | Discharge status: Home Health | Payer: Self-pay | Source: Ambulatory Visit | Attending: Orthopedic Surgery

## 2014-07-29 DIAGNOSIS — F419 Anxiety disorder, unspecified: Secondary | ICD-10-CM | POA: Diagnosis present

## 2014-07-29 DIAGNOSIS — I77819 Aortic ectasia, unspecified site: Secondary | ICD-10-CM | POA: Diagnosis present

## 2014-07-29 DIAGNOSIS — E785 Hyperlipidemia, unspecified: Secondary | ICD-10-CM | POA: Diagnosis present

## 2014-07-29 DIAGNOSIS — Q600 Renal agenesis, unilateral: Secondary | ICD-10-CM

## 2014-07-29 DIAGNOSIS — Z96641 Presence of right artificial hip joint: Secondary | ICD-10-CM | POA: Diagnosis present

## 2014-07-29 DIAGNOSIS — I480 Paroxysmal atrial fibrillation: Secondary | ICD-10-CM | POA: Diagnosis present

## 2014-07-29 DIAGNOSIS — I739 Peripheral vascular disease, unspecified: Secondary | ICD-10-CM | POA: Diagnosis present

## 2014-07-29 DIAGNOSIS — Z79899 Other long term (current) drug therapy: Secondary | ICD-10-CM | POA: Diagnosis not present

## 2014-07-29 DIAGNOSIS — F329 Major depressive disorder, single episode, unspecified: Secondary | ICD-10-CM | POA: Diagnosis present

## 2014-07-29 DIAGNOSIS — Z950 Presence of cardiac pacemaker: Secondary | ICD-10-CM

## 2014-07-29 DIAGNOSIS — I10 Essential (primary) hypertension: Secondary | ICD-10-CM | POA: Diagnosis present

## 2014-07-29 DIAGNOSIS — Z7901 Long term (current) use of anticoagulants: Secondary | ICD-10-CM

## 2014-07-29 DIAGNOSIS — I350 Nonrheumatic aortic (valve) stenosis: Secondary | ICD-10-CM | POA: Diagnosis present

## 2014-07-29 DIAGNOSIS — Z85528 Personal history of other malignant neoplasm of kidney: Secondary | ICD-10-CM | POA: Diagnosis not present

## 2014-07-29 DIAGNOSIS — K219 Gastro-esophageal reflux disease without esophagitis: Secondary | ICD-10-CM | POA: Diagnosis present

## 2014-07-29 DIAGNOSIS — S86811A Strain of other muscle(s) and tendon(s) at lower leg level, right leg, initial encounter: Secondary | ICD-10-CM | POA: Diagnosis present

## 2014-07-29 DIAGNOSIS — G629 Polyneuropathy, unspecified: Secondary | ICD-10-CM | POA: Diagnosis present

## 2014-07-29 DIAGNOSIS — S86811D Strain of other muscle(s) and tendon(s) at lower leg level, right leg, subsequent encounter: Secondary | ICD-10-CM

## 2014-07-29 DIAGNOSIS — I2510 Atherosclerotic heart disease of native coronary artery without angina pectoris: Secondary | ICD-10-CM | POA: Diagnosis present

## 2014-07-29 DIAGNOSIS — M25561 Pain in right knee: Secondary | ICD-10-CM | POA: Diagnosis present

## 2014-07-29 DIAGNOSIS — E039 Hypothyroidism, unspecified: Secondary | ICD-10-CM | POA: Diagnosis present

## 2014-07-29 DIAGNOSIS — Z8542 Personal history of malignant neoplasm of other parts of uterus: Secondary | ICD-10-CM | POA: Diagnosis not present

## 2014-07-29 DIAGNOSIS — W19XXXA Unspecified fall, initial encounter: Secondary | ICD-10-CM | POA: Diagnosis present

## 2014-07-29 HISTORY — PX: TOTAL KNEE REVISION: SHX996

## 2014-07-29 LAB — CREATININE, SERUM
Creatinine, Ser: 1.03 mg/dL (ref 0.50–1.10)
GFR calc non Af Amer: 51 mL/min — ABNORMAL LOW (ref >90)
GFR, EST AFRICAN AMERICAN: 59 mL/min — AB (ref >90)

## 2014-07-29 LAB — CBC
HCT: 36.6 % (ref 36.0–46.0)
Hemoglobin: 11.5 g/dL — ABNORMAL LOW (ref 12.0–15.0)
MCH: 29.5 pg (ref 26.0–34.0)
MCHC: 31.4 g/dL (ref 30.0–36.0)
MCV: 93.8 fL (ref 78.0–100.0)
PLATELETS: 151 10*3/uL (ref 150–400)
RBC: 3.9 MIL/uL (ref 3.87–5.11)
RDW: 15.3 % (ref 11.5–15.5)
WBC: 4.5 10*3/uL (ref 4.0–10.5)

## 2014-07-29 LAB — PROTIME-INR
INR: 1.03 (ref 0.00–1.49)
Prothrombin Time: 13.6 seconds (ref 11.6–15.2)

## 2014-07-29 SURGERY — TOTAL KNEE REVISION
Anesthesia: General | Site: Knee | Laterality: Right

## 2014-07-29 MED ORDER — LOSARTAN POTASSIUM 50 MG PO TABS
100.0000 mg | ORAL_TABLET | Freq: Every morning | ORAL | Status: DC
  Administered 2014-07-30 – 2014-07-31 (×2): 100 mg via ORAL
  Filled 2014-07-29 (×2): qty 2

## 2014-07-29 MED ORDER — ATENOLOL 50 MG PO TABS
50.0000 mg | ORAL_TABLET | Freq: Two times a day (BID) | ORAL | Status: DC
  Administered 2014-07-29 – 2014-07-31 (×4): 50 mg via ORAL
  Filled 2014-07-29 (×5): qty 1

## 2014-07-29 MED ORDER — DEXAMETHASONE SODIUM PHOSPHATE 10 MG/ML IJ SOLN
INTRAMUSCULAR | Status: AC
Start: 2014-07-29 — End: 2014-07-29
  Filled 2014-07-29: qty 1

## 2014-07-29 MED ORDER — WARFARIN SODIUM 3 MG PO TABS
3.0000 mg | ORAL_TABLET | Freq: Once | ORAL | Status: AC
  Administered 2014-07-29: 3 mg via ORAL
  Filled 2014-07-29: qty 1

## 2014-07-29 MED ORDER — CHOLESTYRAMINE 4 G PO PACK
4.0000 g | PACK | Freq: Every day | ORAL | Status: DC
  Filled 2014-07-29 (×2): qty 1

## 2014-07-29 MED ORDER — SODIUM CHLORIDE 0.9 % IV SOLN
2000.0000 mg | Freq: Once | INTRAVENOUS | Status: DC
  Filled 2014-07-29: qty 20

## 2014-07-29 MED ORDER — HYDROMORPHONE HCL 1 MG/ML IJ SOLN: 0.2500 mg | INTRAMUSCULAR | Status: DC | PRN

## 2014-07-29 MED ORDER — CEFAZOLIN SODIUM-DEXTROSE 2-3 GM-% IV SOLR
2.0000 g | INTRAVENOUS | Status: AC
  Administered 2014-07-29: 2 g via INTRAVENOUS

## 2014-07-29 MED ORDER — ONDANSETRON HCL 4 MG/2ML IJ SOLN: 4.0000 mg | Freq: Four times a day (QID) | INTRAMUSCULAR | Status: DC | PRN

## 2014-07-29 MED ORDER — METHOCARBAMOL 1000 MG/10ML IJ SOLN
500.0000 mg | Freq: Four times a day (QID) | INTRAVENOUS | Status: DC | PRN
  Filled 2014-07-29: qty 5

## 2014-07-29 MED ORDER — DEXAMETHASONE SODIUM PHOSPHATE 10 MG/ML IJ SOLN: 10.0000 mg | Freq: Once | INTRAMUSCULAR | Status: DC

## 2014-07-29 MED ORDER — PROPOFOL 10 MG/ML IV BOLUS
INTRAVENOUS | Status: DC | PRN
  Administered 2014-07-29: 120 mg via INTRAVENOUS

## 2014-07-29 MED ORDER — HYDROMORPHONE HCL 1 MG/ML IJ SOLN
INTRAMUSCULAR | Status: DC | PRN
  Administered 2014-07-29 (×2): 0.5 mg via INTRAVENOUS

## 2014-07-29 MED ORDER — LIDOCAINE HCL (CARDIAC) 20 MG/ML IV SOLN
INTRAVENOUS | Status: DC | PRN
  Administered 2014-07-29: 60 mg via INTRAVENOUS

## 2014-07-29 MED ORDER — CELECOXIB 200 MG PO CAPS
200.0000 mg | ORAL_CAPSULE | Freq: Two times a day (BID) | ORAL | Status: DC
  Administered 2014-07-29 – 2014-07-31 (×4): 200 mg via ORAL
  Filled 2014-07-29 (×5): qty 1

## 2014-07-29 MED ORDER — FERROUS SULFATE 325 (65 FE) MG PO TABS
325.0000 mg | ORAL_TABLET | Freq: Three times a day (TID) | ORAL | Status: DC
  Administered 2014-07-29 – 2014-07-31 (×6): 325 mg via ORAL
  Filled 2014-07-29 (×8): qty 1

## 2014-07-29 MED ORDER — SERTRALINE HCL 50 MG PO TABS
50.0000 mg | ORAL_TABLET | Freq: Every evening | ORAL | Status: DC
  Administered 2014-07-29 – 2014-07-30 (×2): 50 mg via ORAL
  Filled 2014-07-29 (×3): qty 1

## 2014-07-29 MED ORDER — TRAMADOL HCL 50 MG PO TABS
50.0000 mg | ORAL_TABLET | Freq: Four times a day (QID) | ORAL | Status: DC
  Administered 2014-07-29 – 2014-07-30 (×3): 50 mg via ORAL
  Filled 2014-07-29 (×5): qty 1

## 2014-07-29 MED ORDER — ONDANSETRON HCL 4 MG/2ML IJ SOLN
INTRAMUSCULAR | Status: DC | PRN
  Administered 2014-07-29: 4 mg via INTRAVENOUS

## 2014-07-29 MED ORDER — DOCUSATE SODIUM 100 MG PO CAPS
100.0000 mg | ORAL_CAPSULE | Freq: Two times a day (BID) | ORAL | Status: DC
  Administered 2014-07-29 – 2014-07-30 (×2): 100 mg via ORAL

## 2014-07-29 MED ORDER — DIPHENHYDRAMINE HCL 25 MG PO CAPS: 25.0000 mg | ORAL_CAPSULE | Freq: Four times a day (QID) | ORAL | Status: DC | PRN

## 2014-07-29 MED ORDER — LIDOCAINE HCL (CARDIAC) 20 MG/ML IV SOLN
INTRAVENOUS | Status: AC
  Filled 2014-07-29: qty 5

## 2014-07-29 MED ORDER — DEXAMETHASONE SODIUM PHOSPHATE 10 MG/ML IJ SOLN
10.0000 mg | Freq: Once | INTRAMUSCULAR | Status: AC
  Administered 2014-07-30: 10 mg via INTRAVENOUS
  Filled 2014-07-29: qty 1

## 2014-07-29 MED ORDER — ACETAMINOPHEN 325 MG PO TABS: 325.0000 mg | ORAL_TABLET | Freq: Four times a day (QID) | ORAL | Status: DC | PRN

## 2014-07-29 MED ORDER — NITROGLYCERIN 0.4 MG SL SUBL: 0.4000 mg | SUBLINGUAL_TABLET | SUBLINGUAL | Status: DC | PRN

## 2014-07-29 MED ORDER — BUPIVACAINE-EPINEPHRINE 0.25% -1:200000 IJ SOLN
INTRAMUSCULAR | Status: DC | PRN
  Administered 2014-07-29: 30 mL

## 2014-07-29 MED ORDER — SODIUM CHLORIDE 0.9 % IV SOLN
INTRAVENOUS | Status: DC
  Filled 2014-07-29 (×6): qty 1000

## 2014-07-29 MED ORDER — LEVOTHYROXINE SODIUM 50 MCG PO TABS
50.0000 ug | ORAL_TABLET | Freq: Every day | ORAL | Status: DC
  Administered 2014-07-30 – 2014-07-31 (×2): 50 ug via ORAL
  Filled 2014-07-29 (×3): qty 1

## 2014-07-29 MED ORDER — ONDANSETRON HCL 4 MG PO TABS: 4.0000 mg | ORAL_TABLET | Freq: Four times a day (QID) | ORAL | Status: DC | PRN

## 2014-07-29 MED ORDER — FUROSEMIDE 40 MG PO TABS
40.0000 mg | ORAL_TABLET | ORAL | Status: DC
  Administered 2014-07-29 – 2014-07-31 (×2): 40 mg via ORAL
  Filled 2014-07-29 (×2): qty 1

## 2014-07-29 MED ORDER — PREGABALIN 75 MG PO CAPS
75.0000 mg | ORAL_CAPSULE | Freq: Two times a day (BID) | ORAL | Status: DC
  Administered 2014-07-29 – 2014-07-31 (×4): 75 mg via ORAL
  Filled 2014-07-29 (×4): qty 1

## 2014-07-29 MED ORDER — POLYETHYLENE GLYCOL 3350 17 G PO PACK: 17.0000 g | PACK | Freq: Two times a day (BID) | ORAL | Status: DC

## 2014-07-29 MED ORDER — METOCLOPRAMIDE HCL 5 MG/ML IJ SOLN: 5.0000 mg | Freq: Three times a day (TID) | INTRAMUSCULAR | Status: DC | PRN

## 2014-07-29 MED ORDER — CEFAZOLIN SODIUM-DEXTROSE 2-3 GM-% IV SOLR
2.0000 g | Freq: Four times a day (QID) | INTRAVENOUS | Status: AC
  Administered 2014-07-29 – 2014-07-30 (×2): 2 g via INTRAVENOUS
  Filled 2014-07-29 (×2): qty 50

## 2014-07-29 MED ORDER — BISACODYL 10 MG RE SUPP: 10.0000 mg | Freq: Every day | RECTAL | Status: DC | PRN

## 2014-07-29 MED ORDER — POTASSIUM CHLORIDE CRYS ER 20 MEQ PO TBCR
20.0000 meq | EXTENDED_RELEASE_TABLET | Freq: Two times a day (BID) | ORAL | Status: DC
  Administered 2014-07-30 – 2014-07-31 (×3): 20 meq via ORAL
  Filled 2014-07-29 (×4): qty 1

## 2014-07-29 MED ORDER — ONDANSETRON HCL 4 MG/2ML IJ SOLN
INTRAMUSCULAR | Status: AC
  Filled 2014-07-29: qty 2

## 2014-07-29 MED ORDER — FAMOTIDINE 20 MG PO TABS
20.0000 mg | ORAL_TABLET | Freq: Two times a day (BID) | ORAL | Status: DC
  Administered 2014-07-29 – 2014-07-31 (×4): 20 mg via ORAL
  Filled 2014-07-29 (×5): qty 1

## 2014-07-29 MED ORDER — PHENOL 1.4 % MT LIQD
1.0000 | OROMUCOSAL | Status: DC | PRN
  Filled 2014-07-29: qty 177

## 2014-07-29 MED ORDER — WARFARIN - PHARMACIST DOSING INPATIENT: Freq: Every day | Status: DC

## 2014-07-29 MED ORDER — MENTHOL 3 MG MT LOZG
1.0000 | LOZENGE | OROMUCOSAL | Status: DC | PRN
  Filled 2014-07-29: qty 9

## 2014-07-29 MED ORDER — ALPRAZOLAM 0.5 MG PO TABS: 0.5000 mg | ORAL_TABLET | Freq: Three times a day (TID) | ORAL | Status: DC | PRN

## 2014-07-29 MED ORDER — MIDAZOLAM HCL 2 MG/2ML IJ SOLN
INTRAMUSCULAR | Status: AC
  Filled 2014-07-29: qty 2

## 2014-07-29 MED ORDER — LACTATED RINGERS IV SOLN: INTRAVENOUS | Status: DC

## 2014-07-29 MED ORDER — MIDAZOLAM HCL 5 MG/5ML IJ SOLN
INTRAMUSCULAR | Status: DC | PRN
  Administered 2014-07-29: 1 mg via INTRAVENOUS

## 2014-07-29 MED ORDER — BUPIVACAINE-EPINEPHRINE (PF) 0.25% -1:200000 IJ SOLN
INTRAMUSCULAR | Status: AC
  Filled 2014-07-29: qty 30

## 2014-07-29 MED ORDER — DEXAMETHASONE SODIUM PHOSPHATE 10 MG/ML IJ SOLN
INTRAMUSCULAR | Status: DC | PRN
  Administered 2014-07-29: 10 mg via INTRAVENOUS

## 2014-07-29 MED ORDER — SODIUM CHLORIDE 0.9 % IJ SOLN
INTRAMUSCULAR | Status: DC | PRN
  Administered 2014-07-29: 20 mL

## 2014-07-29 MED ORDER — FENTANYL CITRATE 0.05 MG/ML IJ SOLN
INTRAMUSCULAR | Status: DC | PRN
  Administered 2014-07-29 (×4): 25 ug via INTRAVENOUS

## 2014-07-29 MED ORDER — FENTANYL CITRATE 0.05 MG/ML IJ SOLN
INTRAMUSCULAR | Status: AC
  Filled 2014-07-29: qty 2

## 2014-07-29 MED ORDER — ALUM & MAG HYDROXIDE-SIMETH 200-200-20 MG/5ML PO SUSP
30.0000 mL | ORAL | Status: DC | PRN
  Filled 2014-07-29: qty 30

## 2014-07-29 MED ORDER — CEFAZOLIN SODIUM-DEXTROSE 2-3 GM-% IV SOLR: INTRAVENOUS | Status: DC | PRN

## 2014-07-29 MED ORDER — PROPOFOL 10 MG/ML IV BOLUS
INTRAVENOUS | Status: AC
  Filled 2014-07-29: qty 20

## 2014-07-29 MED ORDER — CEFAZOLIN SODIUM-DEXTROSE 2-3 GM-% IV SOLR
INTRAVENOUS | Status: AC
  Filled 2014-07-29: qty 50

## 2014-07-29 MED ORDER — METOCLOPRAMIDE HCL 10 MG PO TABS: 5.0000 mg | ORAL_TABLET | Freq: Three times a day (TID) | ORAL | Status: DC | PRN

## 2014-07-29 MED ORDER — ENOXAPARIN SODIUM 40 MG/0.4ML ~~LOC~~ SOLN
40.0000 mg | SUBCUTANEOUS | Status: DC
  Administered 2014-07-30 – 2014-07-31 (×2): 40 mg via SUBCUTANEOUS
  Filled 2014-07-29 (×4): qty 0.4

## 2014-07-29 MED ORDER — SODIUM CHLORIDE 0.9 % IJ SOLN
INTRAMUSCULAR | Status: AC
  Filled 2014-07-29: qty 10

## 2014-07-29 MED ORDER — SODIUM CHLORIDE 0.9 % IJ SOLN
INTRAMUSCULAR | Status: AC
  Filled 2014-07-29: qty 50

## 2014-07-29 MED ORDER — KETOROLAC TROMETHAMINE 30 MG/ML IJ SOLN
INTRAMUSCULAR | Status: AC
  Filled 2014-07-29: qty 1

## 2014-07-29 MED ORDER — METHOCARBAMOL 500 MG PO TABS
500.0000 mg | ORAL_TABLET | Freq: Four times a day (QID) | ORAL | Status: DC | PRN
  Administered 2014-07-30: 500 mg via ORAL
  Filled 2014-07-29 (×2): qty 1

## 2014-07-29 MED ORDER — EPHEDRINE SULFATE 50 MG/ML IJ SOLN
INTRAMUSCULAR | Status: AC
  Filled 2014-07-29: qty 1

## 2014-07-29 MED ORDER — HYDROMORPHONE HCL 2 MG/ML IJ SOLN
INTRAMUSCULAR | Status: AC
  Filled 2014-07-29: qty 1

## 2014-07-29 MED ORDER — LACTATED RINGERS IV SOLN
INTRAVENOUS | Status: DC
  Administered 2014-07-29: 15:00:00 via INTRAVENOUS
  Administered 2014-07-29: 1000 mL via INTRAVENOUS

## 2014-07-29 MED ORDER — NITROGLYCERIN 0.4 MG/HR TD PT24
0.4000 mg | MEDICATED_PATCH | Freq: Every day | TRANSDERMAL | Status: DC | PRN
  Filled 2014-07-29: qty 1

## 2014-07-29 MED ORDER — KETOROLAC TROMETHAMINE 30 MG/ML IJ SOLN
INTRAMUSCULAR | Status: DC | PRN
  Administered 2014-07-29: 30 mg

## 2014-07-29 MED ORDER — CHLORHEXIDINE GLUCONATE 4 % EX LIQD: 60.0000 mL | Freq: Once | CUTANEOUS | Status: DC

## 2014-07-29 MED ORDER — MAGNESIUM CITRATE PO SOLN: 1.0000 | Freq: Once | ORAL | Status: AC | PRN

## 2014-07-29 MED ORDER — EPHEDRINE SULFATE 50 MG/ML IJ SOLN
INTRAMUSCULAR | Status: DC | PRN
  Administered 2014-07-29 (×3): 10 mg via INTRAVENOUS

## 2014-07-29 SURGICAL SUPPLY — 67 items
BAG ZIPLOCK 12X15 (MISCELLANEOUS) IMPLANT
BANDAGE ELASTIC 6 VELCRO ST LF (GAUZE/BANDAGES/DRESSINGS) ×2 IMPLANT
BANDAGE ESMARK 6X9 LF (GAUZE/BANDAGES/DRESSINGS) ×1 IMPLANT
BLADE SAW SGTL 13.0X1.19X90.0M (BLADE) ×2 IMPLANT
BLADE SAW SGTL 81X20 HD (BLADE) ×2 IMPLANT
BNDG ESMARK 6X9 LF (GAUZE/BANDAGES/DRESSINGS) ×2
BRUSH FEMORAL CANAL (MISCELLANEOUS) IMPLANT
CUFF TOURN SGL QUICK 34 (TOURNIQUET CUFF) ×1
CUFF TRNQT CYL 34X4X40X1 (TOURNIQUET CUFF) ×1 IMPLANT
DERMABOND ADVANCED (GAUZE/BANDAGES/DRESSINGS) ×1
DERMABOND ADVANCED .7 DNX12 (GAUZE/BANDAGES/DRESSINGS) ×1 IMPLANT
DRAPE EXTREMITY T 121X128X90 (DRAPE) ×2 IMPLANT
DRAPE POUCH INSTRU U-SHP 10X18 (DRAPES) ×2 IMPLANT
DRAPE U-SHAPE 47X51 STRL (DRAPES) ×2 IMPLANT
DRSG ADAPTIC 3X8 NADH LF (GAUZE/BANDAGES/DRESSINGS) IMPLANT
DRSG AQUACEL AG ADV 3.5X10 (GAUZE/BANDAGES/DRESSINGS) ×2 IMPLANT
DRSG AQUACEL AG ADV 3.5X14 (GAUZE/BANDAGES/DRESSINGS) ×2 IMPLANT
DRSG PAD ABDOMINAL 8X10 ST (GAUZE/BANDAGES/DRESSINGS) IMPLANT
DRSG TEGADERM 4X4.75 (GAUZE/BANDAGES/DRESSINGS) IMPLANT
DURAPREP 26ML APPLICATOR (WOUND CARE) ×4 IMPLANT
ELECT REM PT RETURN 9FT ADLT (ELECTROSURGICAL) ×2
ELECTRODE REM PT RTRN 9FT ADLT (ELECTROSURGICAL) ×1 IMPLANT
FACESHIELD WRAPAROUND (MASK) ×8 IMPLANT
GAUZE SPONGE 2X2 8PLY STRL LF (GAUZE/BANDAGES/DRESSINGS) IMPLANT
GAUZE SPONGE 4X4 12PLY STRL (GAUZE/BANDAGES/DRESSINGS) IMPLANT
GLOVE BIOGEL PI IND STRL 7.5 (GLOVE) ×2 IMPLANT
GLOVE BIOGEL PI IND STRL 8.5 (GLOVE) ×1 IMPLANT
GLOVE BIOGEL PI INDICATOR 7.5 (GLOVE) ×2
GLOVE BIOGEL PI INDICATOR 8.5 (GLOVE) ×1
GLOVE ECLIPSE 8.0 STRL XLNG CF (GLOVE) ×2 IMPLANT
GLOVE ORTHO TXT STRL SZ7.5 (GLOVE) ×6 IMPLANT
GOWN SPEC L3 XXLG W/TWL (GOWN DISPOSABLE) ×2 IMPLANT
GOWN STRL REUS W/TWL LRG LVL3 (GOWN DISPOSABLE) ×2 IMPLANT
HANDPIECE INTERPULSE COAX TIP (DISPOSABLE) ×1
IMMOBILIZER KNEE 20 (SOFTGOODS) ×2 IMPLANT
IMMOBILIZER KNEE 20 THIGH 36 (SOFTGOODS) IMPLANT
KIT BASIN OR (CUSTOM PROCEDURE TRAY) ×2 IMPLANT
LIQUID BAND (GAUZE/BANDAGES/DRESSINGS) ×2 IMPLANT
MANIFOLD NEPTUNE II (INSTRUMENTS) ×2 IMPLANT
NDL SAFETY ECLIPSE 18X1.5 (NEEDLE) ×1 IMPLANT
NEEDLE HYPO 18GX1.5 SHARP (NEEDLE) ×1
NS IRRIG 1000ML POUR BTL (IV SOLUTION) ×2 IMPLANT
PACK TOTAL JOINT (CUSTOM PROCEDURE TRAY) ×2 IMPLANT
PADDING CAST COTTON 6X4 STRL (CAST SUPPLIES) IMPLANT
POSITIONER SURGICAL ARM (MISCELLANEOUS) ×2 IMPLANT
SCREW CANN 5.0X40MM (Screw) ×4 IMPLANT
SET HNDPC FAN SPRY TIP SCT (DISPOSABLE) ×1 IMPLANT
SET PAD KNEE POSITIONER (MISCELLANEOUS) ×2 IMPLANT
SPONGE GAUZE 2X2 STER 10/PKG (GAUZE/BANDAGES/DRESSINGS)
SPONGE LAP 18X18 X RAY DECT (DISPOSABLE) IMPLANT
STAPLER VISISTAT 35W (STAPLE) IMPLANT
SUCTION FRAZIER 12FR DISP (SUCTIONS) ×2 IMPLANT
SUT FIBERWIRE #2 38 T-5 BLUE (SUTURE) ×4
SUT MNCRL AB 3-0 PS2 18 (SUTURE) ×2 IMPLANT
SUT VIC AB 1 CT1 36 (SUTURE) ×6 IMPLANT
SUT VIC AB 2-0 CT1 27 (SUTURE) ×3
SUT VIC AB 2-0 CT1 TAPERPNT 27 (SUTURE) ×3 IMPLANT
SUT VLOC 180 0 24IN GS25 (SUTURE) ×2 IMPLANT
SUTURE FIBERWR #2 38 T-5 BLUE (SUTURE) ×2 IMPLANT
SYR 50ML LL SCALE MARK (SYRINGE) ×2 IMPLANT
TOWEL OR 17X26 10 PK STRL BLUE (TOWEL DISPOSABLE) ×2 IMPLANT
TOWER CARTRIDGE SMART MIX (DISPOSABLE) IMPLANT
TRAY FOLEY CATH 14FRSI W/METER (CATHETERS) ×2 IMPLANT
TUBE KAMVAC SUCTION (TUBING) IMPLANT
WASHER FLAT 5.0MM (Washer) ×4 IMPLANT
WATER STERILE IRR 1500ML POUR (IV SOLUTION) ×2 IMPLANT
WRAP KNEE MAXI GEL POST OP (GAUZE/BANDAGES/DRESSINGS) ×2 IMPLANT

## 2014-07-29 NOTE — Anesthesia Postprocedure Evaluation (Signed)
  Anesthesia Post-op Note  Patient: [name]  Procedure(s) Performed: Procedure(s) (LRB): RIGHT KNEE REVISION OF PREVIOUS REPAIR EXTENSOR MECHANISM (Right)  Patient Location: PACU  Anesthesia Type: General  Level of Consciousness: awake and alert   Airway and Oxygen Therapy: Patient Spontanous Breathing  Post-op Pain: mild  Post-op Assessment: Post-op Vital signs reviewed, Patient's Cardiovascular Status Stable, Respiratory Function Stable, Patent Airway and No signs of Nausea or vomiting  Last Vitals:  Filed Vitals:   07/29/14 1730  BP: 146/56  Pulse: 60  Temp: 36.7 C  Resp: 13    Post-op Vital Signs: stable   Complications: No apparent anesthesia complications

## 2014-07-29 NOTE — Plan of Care (Signed)
Problem: Consults Goal: Total Joint Replacement Patient Education See Patient Education Module for education specifics. Outcome: Completed/Met Date Met:  07/29/14

## 2014-07-29 NOTE — Transfer of Care (Signed)
Immediate Anesthesia Transfer of Care Note  Patient: [name]  Procedure(s) Performed: Procedure(s) (LRB): RIGHT KNEE REVISION OF PREVIOUS REPAIR EXTENSOR MECHANISM (Right)  Patient Location: PACU  Anesthesia Type: General  Level of Consciousness: sedated, patient cooperative and responds to stimulation  Airway & Oxygen Therapy: Patient Spontanous Breathing and Patient connected to face mask oxgen  Post-op Assessment: Report given to PACU RN and Post -op Vital signs reviewed and stable  Post vital signs: Reviewed and stable  Complications: No apparent anesthesia complications

## 2014-07-29 NOTE — Brief Op Note (Signed)
07/29/2014  4:08 PM  PATIENT:  [name]  80 y.o. female  PRE-OPERATIVE DIAGNOSIS:  RIGHT KNEE FAILED EXTENSOR MECHANISM  POST-OPERATIVE DIAGNOSIS:  RIGHT KNEE FAILED EXTENSOR MECHANISM with persistent extensor lag  PROCEDURE:  Procedure(s): RIGHT KNEE REVISION OF PREVIOUS EXTENSOR MECHANISM repair  SURGEON:  Surgeon(s) and Role:    * Mauri Pole, MD - Primary  PHYSICIAN ASSISTANT: Danae Orleans, PA-C  ANESTHESIA:   general  EBL:  Total I/O In: 1000 [I.V.:1000] Out: 300 [Urine:200; Blood:100]  BLOOD ADMINISTERED:none  DRAINS: none   LOCAL MEDICATIONS USED:  MARCAINE     SPECIMEN:  No Specimen  DISPOSITION OF SPECIMEN:  N/A  COUNTS:  YES  TOURNIQUET:   Total Tourniquet Time Documented: Thigh (Right) - 62 minutes Total: Thigh (Right) - 62 minutes   DICTATION: .Other Dictation: Dictation Number [id number]  PLAN OF CARE: Admit to inpatient   PATIENT DISPOSITION:  PACU - hemodynamically stable.   Delay start of Pharmacological VTE agent (>24hrs) due to surgical blood loss or risk of bleeding: no

## 2014-07-29 NOTE — Progress Notes (Signed)
ANTICOAGULATION CONSULT NOTE - Initial Consult  Pharmacy Consult for warfarin Indication: atrial fibrillation and VTE prophylaxis  Allergies  Allergen Reactions  . Codeine Nausea And Vomiting  . Hydrocodone Other (See Comments)    hallucination  . Morphine And Related Nausea And Vomiting  . Requip [Ropinirole Hcl]     Headache   . Zinc     nausea    Patient Measurements: Height: 5\' 1"  (154.9 cm) Weight: 161 lb (73.029 kg) IBW/kg (Calculated) : 47.8  Vital Signs: Temp: 98 F (36.7 C) (11/23 1754) Temp Source: Oral (11/23 1039) BP: 154/55 mmHg (11/23 1754) Pulse Rate: 62 (11/23 1754)  Labs:  Recent Labs  07/29/14 1125  LABPROT 13.6  INR 1.03    Estimated Creatinine Clearance: 42.8 mL/min (by C-G formula based on Cr of 0.99).   Medical History: Past Medical History  Diagnosis Date  . PAF (paroxysmal atrial fibrillation)     controlled with amiodarone, on coumadin  . CAD (coronary artery disease)   . Aortic root dilatation   . Moderate aortic stenosis   . Chronic anticoagulation   . Tachycardia-bradycardia syndrome     s/p PPM by Dr Doreatha Lew (MDT) 07/03/10  . GERD (gastroesophageal reflux disease)   . Pacemaker     2012  . Neuropathy     feet/legs  . Restless leg syndrome   . Headache(784.0)     hx migraines - takes Zoloft for migraines  . History of uterine cancer 1980s    treated with hysterectomy, external radiation and radiation seed implants.   . Cancer     hx Kidney cancer / hx of endometrial cancer  . PONV (postoperative nausea and vomiting)   . Anxiety   . Heart murmur   . HTN (hypertension)   . Hyperlipemia   . Hypothyroidism   . Osteomyelitis as child  . Family history of anesthesia complication     daughter has ponv  . Syncope 05/28/2014  . Hepatic steatosis   . Elevated LFTs   . Gastroparesis   . Anal fissure   . Radiation proctitis   . Gastric polyp     adenomatous  . Neuromuscular disorder     neuropathy in right leg  .  Chronic kidney disease     only has one kidney   . 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 atenolol  50 mg Oral BID  .  ceFAZolin (ANCEF) IV  2 g Intravenous Q6H  . celecoxib  200 mg Oral Q12H  . [START ON 07/30/2014] cholestyramine  4 g Oral Q0600  . [START ON 07/30/2014] dexamethasone  10 mg Intravenous Once  . docusate sodium  100 mg Oral BID  . [START ON 07/30/2014] enoxaparin (LOVENOX) injection  40 mg Subcutaneous Q24H  . famotidine  20 mg Oral BID  . ferrous sulfate  325 mg Oral TID PC  . furosemide  40 mg Oral QODAY  . [START ON 07/30/2014] levothyroxine  50 mcg Oral QAC breakfast  . [START ON 07/30/2014] losartan  100 mg Oral q morning - 10a  . polyethylene glycol  17 g Oral BID  . [START ON 07/30/2014] potassium chloride SA  20 mEq Oral BID  . pregabalin  75 mg Oral BID  . sertraline  50 mg Oral QPM  . traMADol  50-100 mg Oral 4 times per day    Assessment: 89 yoF on chronic warfarin for atrial fibrillation is s/p R knee revision on 11/23. CHADS score of  2 so low thrombosis risk. Pt's warfarin is managed at [facility] and outpatient notes from 11/18 state patient stopped warfarin with last dose on 11/18 in anticipation of knee surgery. Pt received enoxaparin bridge prior to surgery. Pharmacy is now consulted to restart warfarin therapy.  Home warfarin dose 2mg  daily  Weekly doses variable in past 6 months per outpatient flowsheet  Last dose 11/18  INR this AM below therapeutic range at 1.03  CBC at patient's baseline pre-op, no post op labs yet  121ml blood lost during procedure  No diet has been ordered   Noted patient on concurrent enoxaparin 40mg  SQ daily, to be discontinued when INR >/= 1.8  Patient also on concurrent celecoxib and sertraline which have the ability to increase bleeding  Goal of Therapy:  INR 2-3 Monitor platelets by anticoagulation protocol: Yes   Plan:  - warfarin 3mg  PO x1 tonight - daily PT/INR - discontinue  enoxaparin when INR >/= 1.8 - follow-up CBC, signs of bleeding - pharmacy will f/u daily  Thank you for the consult.  Currie Paris, PharmD, BCPS Pager: [phone] Pharmacy: [phone] 07/29/2014 6:20 PM

## 2014-07-29 NOTE — Interval H&P Note (Signed)
History and Physical Interval Note:  07/29/2014 1:28 PM  [name]  has presented today for surgery, with the diagnosis of RIGHT KNEE FAILED EXTENSOR MECHANISM  The various methods of treatment have been discussed with the patient and family. After consideration of risks, benefits and other options for treatment, the patient has consented to  Procedure(s): RIGHT KNEE REVISION REPAIR OF EXTENSOR MECHANISM (Right) as a surgical intervention .  The patient's history has been reviewed, patient examined, no change in status, stable for surgery.  I have reviewed the patient's chart and labs.  Questions were answered to the patient's satisfaction.     Mauri Pole

## 2014-07-29 NOTE — Anesthesia Preprocedure Evaluation (Addendum)
Anesthesia Evaluation  Patient identified by MRN, date of birth, ID band Patient awake  General Assessment Comment:  PAF (paroxysmal atrial fibrillation)         controlled with amiodarone, on coumadin   .  CAD (coronary artery disease)     .  Aortic root dilatation     .  Moderate aortic stenosis     .  Chronic anticoagulation     .  Tachycardia-bradycardia syndrome         s/p PPM by Dr Doreatha Lew (MDT) 07/03/10   .  GERD (gastroesophageal reflux disease)     .  Chronic kidney disease     .  Pacemaker         2012   .  History of nephrectomy         left - due to kidney cancer   .  Neuropathy         feet/legs   .  Restless leg syndrome     .  Headache(784.0)         hx migraines - takes Zoloft for migraines   .  History of uterine cancer     .  Cancer         hx Kidney cancer / hx of endometrial cancer   .  PONV (postoperative nausea and vomiting)     .  Anxiety     .  Heart murmur     .  HTN (hypertension)     .  Hyperlipemia     .  Hypothyroidism     .  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  Osteomyelitis  as child   .  Family history of anesthesia complication         daughter has ponv     Reviewed: Allergy & Precautions, H&P , NPO status , Patient's Chart, lab work & pertinent test results  History of Anesthesia Complications (+) PONV, AWARENESS UNDER ANESTHESIA, Family history of anesthesia reaction and history of anesthetic complications  Airway Mallampati: II  TM Distance: >3 FB Neck ROM: Full    Dental  (+) Edentulous Upper, Dental Advisory Given   Pulmonary neg pulmonary ROS,  breath sounds clear to auscultation  Pulmonary exam normal       Cardiovascular Exercise Tolerance: Good hypertension, Pt. on medications and Pt. on home beta blockers + CAD and + Peripheral Vascular Disease + dysrhythmias Atrial Fibrillation + pacemaker + Valvular Problems/Murmurs AS Rhythm:Regular Rate:Normal + Systolic murmurs Moderate  AS   Neuro/Psych  Headaches, PSYCHIATRIC DISORDERS Anxiety Depression    GI/Hepatic Neg liver ROS, GERD-  Medicated,  Endo/Other  Hypothyroidism   Renal/GU Renal disease  negative genitourinary   Musculoskeletal negative musculoskeletal ROS (+)   Abdominal   Peds negative pediatric ROS (+)  Hematology negative hematology ROS (+)   Anesthesia Other Findings   Reproductive/Obstetrics negative OB ROS                            Anesthesia Physical Anesthesia Plan  ASA: III  Anesthesia Plan: General   Post-op Pain Management:    Induction: Intravenous  Airway Management Planned: Oral ETT  Additional Equipment:   Intra-op Plan:   Post-operative Plan: Extubation in OR  Informed Consent:   Plan Discussed with: Surgeon  Anesthesia Plan Comments:        Anesthesia Quick Evaluation

## 2014-07-30 ENCOUNTER — Encounter (HOSPITAL_COMMUNITY): Payer: Self-pay | Admitting: Orthopedic Surgery

## 2014-07-30 LAB — CBC
HEMATOCRIT: 32.8 % — AB (ref 36.0–46.0)
HEMOGLOBIN: 10.7 g/dL — AB (ref 12.0–15.0)
MCH: 30 pg (ref 26.0–34.0)
MCHC: 32.6 g/dL (ref 30.0–36.0)
MCV: 91.9 fL (ref 78.0–100.0)
Platelets: 177 10*3/uL (ref 150–400)
RBC: 3.57 MIL/uL — AB (ref 3.87–5.11)
RDW: 15 % (ref 11.5–15.5)
WBC: 6.4 10*3/uL (ref 4.0–10.5)

## 2014-07-30 LAB — BASIC METABOLIC PANEL
Anion gap: 12 (ref 5–15)
BUN: 15 mg/dL (ref 6–23)
CO2: 24 meq/L (ref 19–32)
Calcium: 8.6 mg/dL (ref 8.4–10.5)
Chloride: 103 mEq/L (ref 96–112)
Creatinine, Ser: 1.01 mg/dL (ref 0.50–1.10)
GFR calc Af Amer: 60 mL/min — ABNORMAL LOW (ref >90)
GFR, EST NON AFRICAN AMERICAN: 52 mL/min — AB (ref >90)
Glucose, Bld: 141 mg/dL — ABNORMAL HIGH (ref 70–99)
POTASSIUM: 3.9 meq/L (ref 3.7–5.3)
SODIUM: 139 meq/L (ref 137–147)

## 2014-07-30 LAB — PROTIME-INR
INR: 1.11 (ref 0.00–1.49)
Prothrombin Time: 14.4 seconds (ref 11.6–15.2)

## 2014-07-30 MED ORDER — WARFARIN SODIUM 3 MG PO TABS
3.0000 mg | ORAL_TABLET | Freq: Once | ORAL | Status: AC
  Administered 2014-07-30: 3 mg via ORAL
  Filled 2014-07-30: qty 1

## 2014-07-30 MED ORDER — SODIUM CHLORIDE 0.9 % IV BOLUS (SEPSIS)
250.0000 mL | Freq: Once | INTRAVENOUS | Status: AC
  Administered 2014-07-30: 250 mL via INTRAVENOUS

## 2014-07-30 NOTE — Op Note (Signed)
[name], [name]:  [id number]  MEDICAL RECORD NO.:  [id number]  LOCATION:  7793                         FACILITY:  [facility]  PHYSICIAN:  Pietro Cassis. Alvan Dame, M.D.  DATE OF BIRTH:  1933-12-17  DATE OF PROCEDURE:  07/29/2014 DATE OF DISCHARGE:                              OPERATIVE REPORT   PREOPERATIVE DIAGNOSIS:  Status post repair of a ruptured extensor mechanism at the patella insertion on the tibial tubercle with persistent extensor lag and decreased resultant function.  POSTOPERATIVE DIAGNOSIS:  Status post repair of a ruptured extensor mechanism at the patella insertion on the tibial tubercle with persistent extensor lag and decreased resultant function.  PROCEDURE:  Revision repair of a right extensor mechanism, see body of paragraph for details.  SURGICAL ASSISTANT:  Danae Orleans, PA-C.  Note that [name] was present for the entire indication, preoperative position, perioperative management, operative extremity, general facilitation of the case, primary wound closure.  ANESTHESIA:  General.  SPECIMENS:  None.  COMPLICATION:  None.  DRAINS:  None.  TOURNIQUET TIME:  60 minutes at 250 mmHg.  INDICATION FOR PROCEDURE:  [name] is a 80 year old female with a complex history involving her right total knee.  She had a total knee arthroplasty.  It was related to posttraumatic changes in right lower extremity.  She never really attained any significant flexion based on our preoperative motion.  She had developed an infection which was I and D'd and salvaged, however, in the postoperative period, she developed a bit of an extensor lag indicating some injury to her extensor mechanism.  However, her knee was bending and flexing more than she had ever had in her life, so this was tolerable.  Unfortunately, she had a fall when she missed a step and fell backwards, had a hyperflexion injury and resulted in significant inability for extension.   At that point, she was taken back to the operating room and this is now a few months ago where we attempted to repair her mechanism that had ruptured off the tibial tubercle.  She had been followed in the office with conservative measures and an attempt to try to maximize her attempted extension based on the need for this for functional ability.  She had presented to the office with persistent extensor lag, inability to walk without her TROM brace locked in extension, and decreased function.  We discussed the risks of infection, risks of recurrent extensor lag, risks of nonfunctional extensor mechanism as well as different surgical options.  After discussion with the family, we decided at this point that we would proceed in this fashion, attempt to try to maximize her recoverability.  PROCEDURE IN DETAIL:  The patient was brought to the operative theater. Once adequate anesthesia, preoperative antibiotics, Ancef administered, she was positioned supine with the right thigh tourniquet placed.  Right lower extremity was then prepped and draped in a sterile fashion.  Time- out was performed identifying the patient, planned procedure, and extremity.  Midline incision was made through her old incision.  Soft tissue planes created.  Upon entering into the joint, I identified somewhat to my surprise in the light that extensor mechanism was intact.  I was  worried that the tissues were going to be completely disintegrated based on repair, however, not only was the extensor mechanism intact but it did have an intact insertion on the tibial tubercle.  After giving this some thought, I went ahead and made the medial and lateral arthrotomy and type incisions through the joint and found clear synovial fluid.  I ultimately took down the tibial tubercle insertion site.  At this point, using a bone tenaculum, I identified as much as I could possibly to pull the patella down into its normal alignment  to get it from the patella alta position down to more normal anatomic position.  I then passed two #2 FiberWire sutures in a crack out weave pattern medially and laterally with the 4 ends coming up the distal end of the patellar tendon.  Please note that I excised about 2 cm of what I felt to be at this point redundant tendon tissue.  I had a previously placed cancellous screw into the proximal tibia from medial anterior to posterolateral to provide fixation.  Utilizing this as a persistent anchor in combination with the downward pressure applied on the patella as well as the traction on the Krackow #2 FiberWire suture pattern, I wrapped these around this anchor again and sutured this down.  At this point, it appeared to have a very taut fixation on the extensor mechanism.  I then decided to supplement this with 2 cancellous screws into the tibial tubercle, medial and lateral to the keel of the tibial component using a washer.  These were applied as cannulated screws with good fixation.  Once this was done, I irrigated the wound with normal saline solution. Please note that I did debride inside the knee scar tissue to allow for soft tissue immobilization.  Following the irrigation of this system, I did reapproximate the median and lateral arthrotomy using #1 Vicryl.  I injected the surrounding tissue with 0.25% Marcaine with epinephrine and 30 mL of saline for a total of 61 mL.  The remainder of the wound was closed in layers using 2-0 Vicryl and then running 3-0 Monocryl.  The knee was cleaned, dried, and dressed sterilely using Dermabond and Aquacel dressing.  She was then brought to recovery room with an Ace wrap in place.  She tolerated the procedure well.  Findings were reviewed with family.  She will be weightbearing as tolerated.  No knee flexion.  I will try to maximize our overall recovery work, quad extension to maximize her ability to function without the knee  brace.     Pietro Cassis Alvan Dame, M.D.    MDO/MEDQ  D:  07/29/2014  T:  07/30/2014  Job:  [id number]

## 2014-07-30 NOTE — Progress Notes (Signed)
Patient ID: [name], female   DOB: [birth year], 80 y.o.   MRN: [id number] Subjective: 1 Day Post-Op Procedure(s) (LRB): RIGHT KNEE REVISION OF PREVIOUS REPAIR EXTENSOR MECHANISM (Right)    Patient reports pain as mild.  No events overnight, rested ok  Objective:   VITALS:   Filed Vitals:   07/30/14 0606  BP: 140/65  Pulse: 74  Temp: 98 F (36.7 C)  Resp: 16    Neurovascular intact Incision: dressing C/D/I  LABS  Recent Labs  07/29/14 1806 07/30/14 0515  HGB 11.5* 10.7*  HCT 36.6 32.8*  WBC 4.5 6.4  PLT 151 177     Recent Labs  07/29/14 1806 07/30/14 0515  NA  --  139  K  --  3.9  BUN  --  15  CREATININE 1.03 1.01  GLUCOSE  --  141*     Recent Labs  07/29/14 1125 07/30/14 0515  INR 1.03 1.11     Assessment/Plan: 1 Day Post-Op Procedure(s) (LRB): RIGHT KNEE REVISION OF PREVIOUS REPAIR EXTENSOR MECHANISM (Right)   Advance diet Up with therapy Plan for discharge tomorrow Discharge home with home health   Right lower leg in T-ROM brace locked in extension at all times Therapy can begin work on straight leg raises and quad isometrics with brace on

## 2014-07-30 NOTE — Care Management Note (Signed)
    Page 1 of 2   07/30/2014     2:39:50 PM CARE MANAGEMENT NOTE 07/30/2014  Patient:  [name], [name]   Account Number:  [id number]  Date Initiated:  07/30/2014  Documentation initiated by:  [facility]  Subjective/Objective Assessment:   adm: RIGHT KNEE REVISION OF PREVIOUS REPAIR EXTENSOR MECHANISM (Right)     Action/Plan:   discharge planning   Anticipated DC Date:  07/31/2014   Anticipated DC Plan:  [place]  CM consult      [facility] Choice  HOME HEALTH  Resumption Of Svcs/PTA Provider   Choice offered to / List presented to:     DME arranged  NA      DME agency  NA     [place] arranged  HH-1 RN  [place].   Status of service:  Completed, signed off Medicare Important Message given?   (If response is "NO", the following Medicare IM given date fields will be blank) Date Medicare IM given:   Medicare IM given by:   Date Additional Medicare IM given:   Additional Medicare IM given by:    Discharge Disposition:  [place]  Per UR Regulation:  Reviewed for med. necessity/level of care/duration of stay  If discussed at [place] of Stay Meetings, dates discussed:    Comments:  07/30/14 13:55 CM met with pt in room to confirm [facility] as her home health agency.  Pt is active with [facility] now.  Referral called to [facility] rep, Kristen for HHPT/RN (for INR checks). No DME needed.  No other CM needs communicated.  Mariane Masters, BSN, CM [phone].

## 2014-07-30 NOTE — Plan of Care (Signed)
Problem: Phase I Progression Outcomes Goal: Pain controlled with appropriate interventions Outcome: Completed/Met Date Met:  07/30/14  Problem: Phase II Progression Outcomes Goal: Tolerating diet Outcome: Completed/Met Date Met:  07/30/14

## 2014-07-30 NOTE — Evaluation (Signed)
Physical Therapy Evaluation Patient Details Name: [name] MRN: [id number] DOB: Oct 07, 1933 Today's Date: 07/30/2014   History of Present Illness  s/p revision repair right patellla tendon; PMHx: chronic diarrhea, aortic stenosis, TKA, tachy-brady syndrome  Clinical Impression  Pt will benefit from PT to address deficits below; pt has most DME but needs a light weight w/c with ELRs due to no knee flexion allowed on R and needs w/c for longer distance due to limited amb;     Follow Up Recommendations No PT follow up;Home health PT    Equipment Recommendations  None recommended by PT    Recommendations for Other Services       Precautions / Restrictions Precautions Precautions: Fall;Other (comment) Precaution Comments: NO FLEXION  R KNEE; family bringing in her other brace Required Braces or Orthoses: Knee Immobilizer - Right Knee Immobilizer - Right: On at all times      Mobility  Bed Mobility Overal bed mobility: Needs Assistance Bed Mobility: Supine to Sit     Supine to sit: Min assist     General bed mobility comments: cues for technique  Transfers Overall transfer level: Needs assistance Equipment used: Rolling walker (2 wheeled) Transfers: Sit to/from Stand           General transfer comment: cues for hand placement and overall safety  Ambulation/Gait Ambulation/Gait assistance: Min guard Ambulation Distance (Feet): 55 Feet Assistive device: Rolling walker (2 wheeled)       General Gait Details: cues for sequence  Stairs            Wheelchair Mobility    Modified Rankin (Stroke Patients Only)       Balance                                             Pertinent Vitals/Pain Pain Assessment: No/denies pain    Home Living Family/patient expects to be discharged to:: Private residence   Available Help at Discharge: Family;Available 24 hours/day Type of Home: House Home Access: Level entry     Home Layout:  Multi-level;Able to live on main level with bedroom/bathroom Home Equipment: Cane - single point;Crutches;Cane - quad;Bedside commode;Shower seat;Walker - 2 wheels;Grab bars - tub/shower;Hand held shower head;Grab bars - toilet;Adaptive equipment Additional Comments: handicapped height commode    Prior Function Level of Independence: Independent with assistive device(s)         Comments: pt amb with walker prior to adm, w/c she has been using belongs to her husband     Hand Dominance   Dominant Hand: Right    Extremity/Trunk Assessment   Upper Extremity Assessment: Overall WFL for tasks assessed           Lower Extremity Assessment: RLE deficits/detail         Communication   Communication: No difficulties  Cognition Arousal/Alertness: Awake/alert Behavior During Therapy: WFL for tasks assessed/performed Overall Cognitive Status: Within Functional Limits for tasks assessed                      General Comments      Exercises General Exercises - Lower Extremity Ankle Circles/Pumps: 10 reps      Assessment/Plan    PT Assessment Patient needs continued PT services  PT Diagnosis Difficulty walking   PT Problem List Decreased strength;Decreased activity tolerance;Decreased mobility;Decreased safety awareness  PT Treatment Interventions DME instruction;Gait training;Functional  mobility training;Therapeutic activities;Patient/family education   PT Goals (Current goals can be found in the Care Plan section) Acute Rehab PT Goals Patient Stated Goal: home tomorrow PT Goal Formulation: With patient Time For Goal Achievement: 08/01/14 Potential to Achieve Goals: Good    Frequency Min 6X/week   Barriers to discharge        Co-evaluation               End of Session Equipment Utilized During Treatment: Gait belt;Right knee immobilizer Activity Tolerance: Patient tolerated treatment well Patient left: with call bell/phone within reach;in  chair;with family/visitor present Nurse Communication: Mobility status         Time: 0156-1537 PT Time Calculation (min) (ACUTE ONLY): 30 min   Charges:   PT Evaluation $Initial PT Evaluation Tier I: 1 Procedure PT Treatments $Gait Training: 8-22 mins $Therapeutic Activity: 8-22 mins   PT G Codes:          [facility] Aug 14, 2014, 9:37 AM

## 2014-07-30 NOTE — Plan of Care (Signed)
Problem: Phase I Progression Outcomes Goal: [place] or out of bed evening of surgery Outcome: Completed/Met Date Met:  07/30/14     

## 2014-07-30 NOTE — Progress Notes (Signed)
ANTICOAGULATION CONSULT NOTE - follow up  Pharmacy Consult for warfarin Indication: atrial fibrillation and VTE prophylaxis  Allergies  Allergen Reactions  . Codeine Nausea And Vomiting  . Hydrocodone Other (See Comments)    hallucination  . Morphine And Related Nausea And Vomiting  . Requip [Ropinirole Hcl]     Headache   . Zinc     nausea    Patient Measurements: Height: 5\' 1"  (154.9 cm) Weight: 161 lb (73.029 kg) IBW/kg (Calculated) : 47.8  Vital Signs: Temp: 98.3 F (36.8 C) (11/24 0937) Temp Source: Oral (11/24 0606) BP: 128/59 mmHg (11/24 0937) Pulse Rate: 67 (11/24 0937)  Labs:  Recent Labs  07/29/14 1125 07/29/14 1806 07/30/14 0515  HGB  --  11.5* 10.7*  HCT  --  36.6 32.8*  PLT  --  151 177  LABPROT 13.6  --  14.4  INR 1.03  --  1.11  CREATININE  --  1.03 1.01    Estimated Creatinine Clearance: 42 mL/min (by C-G formula based on Cr of 1.01).   Medical History: Past Medical History  Diagnosis Date  . PAF (paroxysmal atrial fibrillation)     controlled with amiodarone, on coumadin  . CAD (coronary artery disease)   . Aortic root dilatation   . Moderate aortic stenosis   . Chronic anticoagulation   . Tachycardia-bradycardia syndrome     s/p PPM by Dr Doreatha Lew (MDT) 07/03/10  . GERD (gastroesophageal reflux disease)   . Pacemaker     2012  . Neuropathy     feet/legs  . Restless leg syndrome   . Headache(784.0)     hx migraines - takes Zoloft for migraines  . History of uterine cancer 1980s    treated with hysterectomy, external radiation and radiation seed implants.   . Cancer     hx Kidney cancer / hx of endometrial cancer  . PONV (postoperative nausea and vomiting)   . Anxiety   . Heart murmur   . HTN (hypertension)   . Hyperlipemia   . Hypothyroidism   . Osteomyelitis as child  . Family history of anesthesia complication     daughter has ponv  . Syncope 05/28/2014  . Hepatic steatosis   . Elevated LFTs   . Gastroparesis   .  Anal fissure   . Radiation proctitis   . Gastric polyp     adenomatous  . Neuromuscular disorder     neuropathy in right leg  . Chronic kidney disease     only has one kidney   . 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 atenolol  50 mg Oral BID  . celecoxib  200 mg Oral Q12H  . cholestyramine  4 g Oral Q0600  . docusate sodium  100 mg Oral BID  . enoxaparin (LOVENOX) injection  40 mg Subcutaneous Q24H  . famotidine  20 mg Oral BID  . ferrous sulfate  325 mg Oral TID PC  . furosemide  40 mg Oral QODAY  . levothyroxine  50 mcg Oral QAC breakfast  . losartan  100 mg Oral q morning - 10a  . polyethylene glycol  17 g Oral BID  . potassium chloride SA  20 mEq Oral BID  . pregabalin  75 mg Oral BID  . sertraline  50 mg Oral QPM  . traMADol  50-100 mg Oral 4 times per day  . Warfarin - Pharmacist Dosing Inpatient   Does not apply q1800    Assessment: 13 yoF  on chronic warfarin for atrial fibrillation is s/p R knee revision on 11/23. CHADS score of 2 so low thrombosis risk. Pt's warfarin is managed at [facility] and outpatient notes from 11/18 state patient stopped warfarin with last dose on 11/18 in anticipation of knee surgery. Pt received enoxaparin bridge prior to surgery. Pharmacy is now consulted to restart warfarin therapy.  Home warfarin dose 2mg  daily  Weekly doses variable in past 6 months per outpatient flowsheet  Warfarin 3mg  x1 last night  INR this AM below therapeutic range at 1.11  Hgb 10.7 as expected after surgery  Regular diet   Noted patient on concurrent enoxaparin 40mg  SQ daily, to be discontinued when INR >/= 1.8  Patient also on concurrent celecoxib and sertraline which have the ability to increase bleeding  Goal of Therapy:  INR 2-3 Monitor platelets by anticoagulation protocol: Yes   Plan:  - warfarin 3mg  PO x1 tonight - daily PT/INR - discontinue enoxaparin when INR >/= 1.8 - follow-up CBC, signs of bleeding - pharmacy will f/u  daily  Thank you for the consult.  Dolly Rias RPh 07/30/2014, 11:17 AM Pager [phone]

## 2014-07-30 NOTE — Evaluation (Signed)
Occupational Therapy Evaluation Patient Details Name: [name] MRN: [id number] DOB: 12-Apr-1934 Today's Date: 07/30/2014    History of Present Illness s/p revision repair right patellla tendon; PMHx: chronic diarrhea, aortic stenosis, TKA, tachy-brady syndrome   Clinical Impression   Pt doing well. Family has been assisting for awhile PTA with ADL and familiar with techniques. Pt needs min/mod cues for safety with walker use and aligning with surfaces prior to sitting. Will follow to progress safety with ADL for d/c home with family.    Follow Up Recommendations  No OT follow up;Supervision/Assistance - 24 hour    Equipment Recommendations  None recommended by OT    Recommendations for Other Services       Precautions / Restrictions Precautions Precautions: Fall;Other (comment) Precaution Comments: NO FLEXION  R KNEE; family bringing in her other brace Required Braces or Orthoses: Knee Immobilizer - Right Knee Immobilizer - Right: On at all times      Mobility Bed Mobility             Transfers Overall transfer level: Needs assistance Equipment used: Rolling walker (2 wheeled) Transfers: Sit to/from Stand Sit to Stand: Min assist         General transfer comment: verbal cues for hand placement and LE management.    Balance                                            ADL Overall ADL's : Needs assistance/impaired Eating/Feeding: Independent;Sitting   Grooming: Wash/dry hands;Set up;Sitting   Upper Body Bathing: Set up;Sitting   Lower Body Bathing: Moderate assistance;Sit to/from stand   Upper Body Dressing : Minimal assistance;Sitting   Lower Body Dressing: Moderate assistance;Sit to/from stand   Toilet Transfer: Minimal assistance;BSC;Ambulation;RW   Toileting- Clothing Manipulation and Hygiene: Minimal assistance;Sit to/from stand         General ADL Comments: Min assist to manage in tighter space in bathroom with  walker and support R LE to transfer on and off toilet. Discussed use of a stool while sitting on toilet to support LE. Pt has a higher toilet and the 3in1 to go over it so discussed importance of sliding forward before standing from 3in1 to make sure her feet touch the floor prior to standing up to have support. Daughters have been assisting with LB dressing/bathing and she has been covering up R LE with saran wrap and bag to shower. Recommended pt discuss showering with MD before d/c to see if she needs to sponge bathe or if she is allowed to shower. Min/mod verbal cues and assist to align with commode or chair before sitting and hand placement.      Vision                     Perception     Praxis      Pertinent Vitals/Pain Pain Assessment: No/denies pain     Hand Dominance Right   Extremity/Trunk Assessment Upper Extremity Assessment Upper Extremity Assessment: Overall WFL for tasks assessed          Communication Communication Communication: No difficulties   Cognition Arousal/Alertness: Awake/alert Behavior During Therapy: WFL for tasks assessed/performed Overall Cognitive Status: Within Functional Limits for tasks assessed                     General Comments  Exercises       Shoulder Instructions      Home Living Family/patient expects to be discharged to:: Private residence   Available Help at Discharge: Family;Available 24 hours/day Type of Home: House Home Access: Level entry     Home Layout: Multi-level;Able to live on main level with bedroom/bathroom Alternate Level Stairs-Number of Steps: 6 Alternate Level Stairs-Rails: Left Bathroom Shower/Tub: Occupational psychologist: Handicapped height     Home Equipment: Wheelchair - manual;Cane - single point;Crutches;Cane - quad;Bedside commode;Shower seat;Walker - 2 wheels;Grab bars - tub/shower;Hand held shower head;Grab bars - toilet;Adaptive equipment   Additional Comments:  handicapped height commode      Prior Functioning/Environment Level of Independence: Independent with assistive device(s)        Comments: pt amb with walker prior to adm, w/c she has been using belongs to her husband    OT Diagnosis: Generalized weakness   OT Problem List: Decreased strength;Decreased knowledge of use of DME or AE   OT Treatment/Interventions: Self-care/ADL training;Patient/family education;Therapeutic activities;DME and/or AE instruction    OT Goals(Current goals can be found in the care plan section) Acute Rehab OT Goals Patient Stated Goal: home tomorrow OT Goal Formulation: With patient/family Time For Goal Achievement: 08/06/14 Potential to Achieve Goals: Good  OT Frequency: Min 2X/week   Barriers to D/C:            Co-evaluation              End of Session Equipment Utilized During Treatment: Rolling walker  Activity Tolerance: Patient tolerated treatment well Patient left: in chair;with call bell/phone within reach;with family/visitor present   Time: 9563-8756 OT Time Calculation (min): 29 min Charges:  OT General Charges $OT Visit: 1 Procedure OT Evaluation $Initial OT Evaluation Tier I: 1 Procedure OT Treatments $Self Care/Home Management : 8-22 mins $Therapeutic Activity: 8-22 mins G-Codes:    Jules Schick  [phone] 07/30/2014, 10:10 AM

## 2014-07-30 NOTE — Plan of Care (Signed)
Problem: Phase II Progression Outcomes Goal: Ambulates Outcome: Progressing  Problem: Phase III Progression Outcomes Goal: Pain controlled on oral analgesia Outcome: Progressing

## 2014-07-31 LAB — BASIC METABOLIC PANEL
Anion gap: 14 (ref 5–15)
BUN: 21 mg/dL (ref 6–23)
CO2: 22 mEq/L (ref 19–32)
Calcium: 8.5 mg/dL (ref 8.4–10.5)
Chloride: 104 mEq/L (ref 96–112)
Creatinine, Ser: 1.35 mg/dL — ABNORMAL HIGH (ref 0.50–1.10)
GFR calc Af Amer: 42 mL/min — ABNORMAL LOW (ref >90)
GFR, EST NON AFRICAN AMERICAN: 37 mL/min — AB (ref >90)
Glucose, Bld: 129 mg/dL — ABNORMAL HIGH (ref 70–99)
POTASSIUM: 4.2 meq/L (ref 3.7–5.3)
SODIUM: 140 meq/L (ref 137–147)

## 2014-07-31 LAB — CBC
HCT: 31.7 % — ABNORMAL LOW (ref 36.0–46.0)
HEMOGLOBIN: 10.4 g/dL — AB (ref 12.0–15.0)
MCH: 30.3 pg (ref 26.0–34.0)
MCHC: 32.8 g/dL (ref 30.0–36.0)
MCV: 92.4 fL (ref 78.0–100.0)
PLATELETS: 181 10*3/uL (ref 150–400)
RBC: 3.43 MIL/uL — AB (ref 3.87–5.11)
RDW: 15.6 % — ABNORMAL HIGH (ref 11.5–15.5)
WBC: 11 10*3/uL — ABNORMAL HIGH (ref 4.0–10.5)

## 2014-07-31 LAB — PROTIME-INR
INR: 1.2 (ref 0.00–1.49)
PROTHROMBIN TIME: 15.4 s — AB (ref 11.6–15.2)

## 2014-07-31 MED ORDER — POLYETHYLENE GLYCOL 3350 17 G PO PACK: 17.0000 g | PACK | Freq: Two times a day (BID) | ORAL | Status: DC

## 2014-07-31 MED ORDER — METHOCARBAMOL 500 MG PO TABS: 500.0000 mg | ORAL_TABLET | Freq: Four times a day (QID) | ORAL | Status: DC | PRN

## 2014-07-31 MED ORDER — FERROUS SULFATE 325 (65 FE) MG PO TABS
325.0000 mg | ORAL_TABLET | Freq: Three times a day (TID) | ORAL | Status: DC
Start: 2014-07-31 — End: 2014-09-24

## 2014-07-31 MED ORDER — WARFARIN SODIUM 3 MG PO TABS
3.0000 mg | ORAL_TABLET | Freq: Once | ORAL | Status: DC
  Filled 2014-07-31: qty 1

## 2014-07-31 MED ORDER — WARFARIN SODIUM 1 MG PO TABS: 1.0000 mg | ORAL_TABLET | Freq: Every day | ORAL | Status: DC

## 2014-07-31 MED ORDER — DSS 100 MG PO CAPS: 100.0000 mg | ORAL_CAPSULE | Freq: Two times a day (BID) | ORAL | Status: DC

## 2014-07-31 MED ORDER — ACETAMINOPHEN 325 MG PO TABS: 325.0000 mg | ORAL_TABLET | Freq: Four times a day (QID) | ORAL | Status: DC | PRN

## 2014-07-31 MED ORDER — TRAMADOL HCL 50 MG PO TABS: 50.0000 mg | ORAL_TABLET | Freq: Four times a day (QID) | ORAL | Status: DC | PRN

## 2014-07-31 MED ORDER — ENOXAPARIN SODIUM 40 MG/0.4ML ~~LOC~~ SOLN
40.0000 mg | SUBCUTANEOUS | Status: DC
Start: 2014-07-31 — End: 2014-08-14

## 2014-07-31 NOTE — Progress Notes (Addendum)
     Subjective: 2 Days Post-Op Procedure(s) (LRB): RIGHT KNEE REVISION OF PREVIOUS REPAIR EXTENSOR MECHANISM (Right)   Patient reports pain as mild, pain controlled. No events throughout the night. Remembers having issues with the dressing last time, so we took it off this morning. Feels that she is doing well this morning.   Objective:   VITALS:   Filed Vitals:   07/31/14 0631  BP: 141/64  Pulse: 81  Temp: 97.9 F (36.6 C)  Resp: 17    Dorsiflexion/Plantar flexion intact Incision: dressing C/D/I No cellulitis present Compartment soft  LABS  Recent Labs  07/29/14 1806 07/30/14 0515 07/31/14 0456  HGB 11.5* 10.7* 10.4*  HCT 36.6 32.8* 31.7*  WBC 4.5 6.4 11.0*  PLT 151 177 181     Recent Labs  07/29/14 1806 07/30/14 0515 07/31/14 0456  NA  --  139 140  K  --  3.9 4.2  BUN  --  15 21  CREATININE 1.03 1.01 1.35*  GLUCOSE  --  141* 129*     Assessment/Plan: 2 Days Post-Op Procedure(s) (LRB): RIGHT KNEE REVISION OF PREVIOUS REPAIR EXTENSOR MECHANISM (Right)  Took off the Aquacel dressing, replaced with 4x4 gauze and paper tape. Up with therapy Discharge home with home health  Follow up in 2 weeks at [facility]. Follow up with OLIN,Shenee Wignall D in 2 weeks.  Contact information:  [facility] [address], [address] [place] Jamielynn Wigley   PAC  07/31/2014, 10:01 AM

## 2014-07-31 NOTE — Progress Notes (Signed)
Physical Therapy Treatment Patient Details Name: [name] MRN: [id number] DOB: Dec 14, 1933 Today's Date: 07/31/2014    History of Present Illness s/p revision repair right patellla tendon; PMHx: chronic diarrhea, aortic stenosis, TKA, tachy-brady syndrome    PT Comments    Pt doing well; KI trimmed for incr pt comfort; ice after therapy; pt is doing well;  Follow Up Recommendations  No PT follow up;Home health PT     Equipment Recommendations  None recommended by PT    Recommendations for Other Services       Precautions / Restrictions Precautions Precautions: Fall;Other (comment) Precaution Comments: NO FLEXION  R KNEE; family bringing in her other brace Required Braces or Orthoses: Knee Immobilizer - Right Knee Immobilizer - Right: On at all times Restrictions Weight Bearing Restrictions: No Other Position/Activity Restrictions: attempted to place Donjoy brace at pt request but too painful, KI trimmed abnd replaced    Mobility  Bed Mobility Overal bed mobility: Needs Assistance Bed Mobility: Supine to Sit;Sit to Supine     Supine to sit: Min guard Sit to supine: Min guard   General bed mobility comments: cues for technique  Transfers Overall transfer level: Needs assistance Equipment used: Rolling walker (2 wheeled) Transfers: Sit to/from Stand Sit to Stand: Min guard;Supervision         General transfer comment: verbal cues for hand placement and LE management.  Ambulation/Gait Ambulation/Gait assistance: Min guard;Supervision Ambulation Distance (Feet): 85 Feet Assistive device: Rolling walker (2 wheeled) Gait Pattern/deviations: Step-through pattern;Step-to pattern     General Gait Details: cues for  posture and breathing   Stairs            Wheelchair Mobility    Modified Rankin (Stroke Patients Only)       Balance                                    Cognition Arousal/Alertness: Awake/alert Behavior During  Therapy: WFL for tasks assessed/performed Overall Cognitive Status: Within Functional Limits for tasks assessed                      Exercises General Exercises - Lower Extremity Ankle Circles/Pumps: 10 reps    General Comments        Pertinent Vitals/Pain Pain Assessment: 0-10 Pain Score: 1  Pain Location: R shin Pain Descriptors / Indicators: Sore    Home Living                      Prior Function            PT Goals (current goals can now be found in the care plan section) Acute Rehab PT Goals PT Goal Formulation: With patient Time For Goal Achievement: 08/01/14 Potential to Achieve Goals: Good Progress towards PT goals: Progressing toward goals    Frequency  Min 6X/week    PT Plan Current plan remains appropriate    Co-evaluation             End of Session Equipment Utilized During Treatment: Right knee immobilizer Activity Tolerance: Patient tolerated treatment well Patient left: in bed;with call bell/phone within reach;with family/visitor present     Time: 0932-6712 PT Time Calculation (min) (ACUTE ONLY): 21 min  Charges:  $Gait Training: 8-22 mins                    G Codes:  [facility] 07/31/2014, 9:55 AM

## 2014-07-31 NOTE — Plan of Care (Signed)
Problem: Phase III Progression Outcomes Goal: Incision clean - minimal/no drainage Outcome: Completed/Met Date Met:  07/31/14

## 2014-07-31 NOTE — Progress Notes (Signed)
OT Cancellation Note  Patient Details Name: [name] MRN: [id number] DOB: Feb 21, 1934   Cancelled Treatment:    Reason Eval/Treat Not Completed: Other (comment).  Checked with pt:  She has no further questions for OT & family will assist. Will sign off.  Shavone Nevers 07/31/2014, 10:08 AM  Lesle Chris, OTR/L [phone] 07/31/2014

## 2014-07-31 NOTE — Progress Notes (Signed)
ANTICOAGULATION CONSULT NOTE - follow up  Pharmacy Consult for warfarin Indication: atrial fibrillation and VTE prophylaxis  Allergies  Allergen Reactions  . Codeine Nausea And Vomiting  . Hydrocodone Other (See Comments)    hallucination  . Morphine And Related Nausea And Vomiting  . Requip [Ropinirole Hcl]     Headache   . Zinc     nausea    Patient Measurements: Height: 5\' 1"  (154.9 cm) Weight: 161 lb (73.029 kg) IBW/kg (Calculated) : 47.8  Vital Signs: Temp: 97.9 F (36.6 C) (11/25 0631) Temp Source: Oral (11/25 0631) BP: 141/64 mmHg (11/25 0631) Pulse Rate: 81 (11/25 0631)  Labs:  Recent Labs  07/29/14 1125  07/29/14 1806 07/30/14 0515 07/31/14 0456  HGB  --   < > 11.5* 10.7* 10.4*  HCT  --   --  36.6 32.8* 31.7*  PLT  --   --  151 177 181  LABPROT 13.6  --   --  14.4 15.4*  INR 1.03  --   --  1.11 1.20  CREATININE  --   --  1.03 1.01 1.35*  < > = values in this interval not displayed.  Estimated Creatinine Clearance: 31.4 mL/min (by C-G formula based on Cr of 1.35).   Medical History: Past Medical History  Diagnosis Date  . PAF (paroxysmal atrial fibrillation)     controlled with amiodarone, on coumadin  . CAD (coronary artery disease)   . Aortic root dilatation   . Moderate aortic stenosis   . Chronic anticoagulation   . Tachycardia-bradycardia syndrome     s/p PPM by Dr Doreatha Lew (MDT) 07/03/10  . GERD (gastroesophageal reflux disease)   . Pacemaker     2012  . Neuropathy     feet/legs  . Restless leg syndrome   . Headache(784.0)     hx migraines - takes Zoloft for migraines  . History of uterine cancer 1980s    treated with hysterectomy, external radiation and radiation seed implants.   . Cancer     hx Kidney cancer / hx of endometrial cancer  . PONV (postoperative nausea and vomiting)   . Anxiety   . Heart murmur   . HTN (hypertension)   . Hyperlipemia   . Hypothyroidism   . Osteomyelitis as child  . Family history of anesthesia  complication     daughter has ponv  . Syncope 05/28/2014  . Hepatic steatosis   . Elevated LFTs   . Gastroparesis   . Anal fissure   . Radiation proctitis   . Gastric polyp     adenomatous  . Neuromuscular disorder     neuropathy in right leg  . Chronic kidney disease     only has one kidney   . 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 atenolol  50 mg Oral BID  . celecoxib  200 mg Oral Q12H  . cholestyramine  4 g Oral Q0600  . docusate sodium  100 mg Oral BID  . enoxaparin (LOVENOX) injection  40 mg Subcutaneous Q24H  . famotidine  20 mg Oral BID  . ferrous sulfate  325 mg Oral TID PC  . furosemide  40 mg Oral QODAY  . levothyroxine  50 mcg Oral QAC breakfast  . losartan  100 mg Oral q morning - 10a  . polyethylene glycol  17 g Oral BID  . potassium chloride SA  20 mEq Oral BID  . pregabalin  75 mg Oral BID  . sertraline  50 mg Oral QPM  . traMADol  50-100 mg Oral 4 times per day  . Warfarin - Pharmacist Dosing Inpatient   Does not apply q1800    Assessment: 19 yoF on chronic warfarin for atrial fibrillation is s/p R knee revision on 11/23. CHADS score of 2 so low thrombosis risk. Pt's warfarin is managed at [facility] and outpatient notes from 11/18 state patient stopped warfarin with last dose on 11/18 in anticipation of knee surgery. Pt received enoxaparin bridge prior to surgery. Pharmacy is now consulted to restart warfarin therapy.  Home warfarin dose 2mg  daily  Weekly doses variable in past 6 months per outpatient flowsheet  Warfarin 3mg  x1 last night  INR this AM below therapeutic range at 1.2 but progressing nicely  Hgb 10.4 as expected after surgery  Regular diet  Noted patient on concurrent enoxaparin 40mg  SQ daily, to be discontinued when INR >/= 1.8  Patient also on concurrent celecoxib and sertraline which have the ability to increase bleeding  Goal of Therapy:  INR 2-3 Monitor platelets by anticoagulation protocol: Yes   Plan:  -  warfarin 3mg  PO x1 tonight - daily PT/INR - discontinue enoxaparin when INR >/= 1.8 - follow-up CBC, signs of bleeding - pharmacy will f/u daily  Thank you for the consult.  Dolly Rias RPh 07/31/2014, 11:05 AM Pager [phone]

## 2014-07-31 NOTE — Plan of Care (Signed)
Problem: Consults Goal: Diagnosis- Total Joint Replacement Outcome: Completed/Met Date Met:  07/31/14 Revision Total Knee, Patellar tendon repair. Goal: Skin Care Protocol Initiated - if Braden Score 18 or less If consults are not indicated, leave blank or document N/A  Outcome: Not Applicable Date Met:  62/94/76 Goal: Nutrition Consult-if indicated Outcome: Not Applicable Date Met:  54/65/03 Goal: Diabetes Guidelines if Diabetic/Glucose > 140 If diabetic or lab glucose is > 140 mg/dl - Initiate Diabetes/Hyperglycemia Guidelines & Document Interventions  Outcome: Not Applicable Date Met:  54/65/68  Problem: Phase I Progression Outcomes Goal: CMS/Neurovascular status WDL Outcome: Completed/Met Date Met:  07/31/14 Goal: Initial discharge plan identified Outcome: Completed/Met Date Met:  07/31/14 Goal: Hemodynamically stable Outcome: Completed/Met Date Met:  07/31/14 Goal: Other Phase I Outcomes/Goals Outcome: Not Applicable Date Met:  12/75/17  Problem: Phase II Progression Outcomes Goal: Ambulates Outcome: Completed/Met Date Met:  07/31/14 Goal: Discharge plan established Outcome: Completed/Met Date Met:  07/31/14 Goal: Other Phase II Outcomes/Goals Outcome: Not Applicable Date Met:  00/17/49  Problem: Phase III Progression Outcomes Goal: Pain controlled on oral analgesia Outcome: Completed/Met Date Met:  07/31/14 Goal: Ambulates Outcome: Completed/Met Date Met:  07/31/14 Goal: Discharge plan remains appropriate-arrangements made Outcome: Completed/Met Date Met:  07/31/14 Goal: Anticoagulant follow-up in place Outcome: Completed/Met Date Met:  07/31/14 Lovenox and Coumadin. Will be followed by [place]. Goal: Other Phase III Outcomes/Goals Outcome: Not Applicable Date Met:  44/96/75  Problem: Discharge Progression Outcomes Goal: Barriers To Progression Addressed/Resolved Outcome: Not Applicable Date Met:  91/63/84 Goal: CMS/Neurovascular status at or above  baseline Outcome: Completed/Met Date Met:  07/31/14 Goal: Anticoagulant follow-up in place Outcome: Completed/Met Date Met:  07/31/14 Goal: Pain controlled with appropriate interventions Outcome: Completed/Met Date Met:  07/31/14 Goal: Hemodynamically stable Outcome: Completed/Met Date Met:  66/59/93 Goal: Complications resolved/controlled Outcome: Not Applicable Date Met:  57/01/77 Goal: Tolerates diet Outcome: Completed/Met Date Met:  07/31/14 Goal: Activity appropriate for discharge plan Outcome: Completed/Met Date Met:  07/31/14 Goal: Ambulates safely using assistive device Outcome: Completed/Met Date Met:  07/31/14 Goal: Follows weight - bearing limitations Outcome: Completed/Met Date Met:  07/31/14 Goal: Discharge plan in place and appropriate Outcome: Completed/Met Date Met:  07/31/14

## 2014-08-02 ENCOUNTER — Encounter (HOSPITAL_COMMUNITY): Payer: Self-pay | Admitting: *Deleted

## 2014-08-02 ENCOUNTER — Emergency Department (HOSPITAL_COMMUNITY): Payer: Medicare Other

## 2014-08-02 ENCOUNTER — Telehealth: Payer: Self-pay | Admitting: Cardiology

## 2014-08-02 ENCOUNTER — Inpatient Hospital Stay (HOSPITAL_COMMUNITY)
Admission: EM | Admit: 2014-08-02 | Discharge: 2014-08-05 | DRG: 291 | Disposition: A | Discharge status: Home / Self Care | Payer: Medicare Other | Attending: Internal Medicine | Admitting: Internal Medicine

## 2014-08-02 DIAGNOSIS — Z96641 Presence of right artificial hip joint: Secondary | ICD-10-CM | POA: Diagnosis present

## 2014-08-02 DIAGNOSIS — K760 Fatty (change of) liver, not elsewhere classified: Secondary | ICD-10-CM | POA: Diagnosis present

## 2014-08-02 DIAGNOSIS — Z888 Allergy status to other drugs, medicaments and biological substances status: Secondary | ICD-10-CM | POA: Diagnosis not present

## 2014-08-02 DIAGNOSIS — R0602 Shortness of breath: Secondary | ICD-10-CM | POA: Diagnosis not present

## 2014-08-02 DIAGNOSIS — E785 Hyperlipidemia, unspecified: Secondary | ICD-10-CM | POA: Diagnosis present

## 2014-08-02 DIAGNOSIS — N189 Chronic kidney disease, unspecified: Secondary | ICD-10-CM | POA: Diagnosis present

## 2014-08-02 DIAGNOSIS — G2581 Restless legs syndrome: Secondary | ICD-10-CM | POA: Diagnosis present

## 2014-08-02 DIAGNOSIS — J189 Pneumonia, unspecified organism: Secondary | ICD-10-CM | POA: Diagnosis present

## 2014-08-02 DIAGNOSIS — I5031 Acute diastolic (congestive) heart failure: Secondary | ICD-10-CM | POA: Diagnosis not present

## 2014-08-02 DIAGNOSIS — Z85528 Personal history of other malignant neoplasm of kidney: Secondary | ICD-10-CM | POA: Diagnosis not present

## 2014-08-02 DIAGNOSIS — I2510 Atherosclerotic heart disease of native coronary artery without angina pectoris: Secondary | ICD-10-CM | POA: Diagnosis present

## 2014-08-02 DIAGNOSIS — G43909 Migraine, unspecified, not intractable, without status migrainosus: Secondary | ICD-10-CM | POA: Diagnosis present

## 2014-08-02 DIAGNOSIS — Z905 Acquired absence of kidney: Secondary | ICD-10-CM | POA: Diagnosis present

## 2014-08-02 DIAGNOSIS — Z9049 Acquired absence of other specified parts of digestive tract: Secondary | ICD-10-CM | POA: Diagnosis present

## 2014-08-02 DIAGNOSIS — E039 Hypothyroidism, unspecified: Secondary | ICD-10-CM | POA: Diagnosis present

## 2014-08-02 DIAGNOSIS — Z7901 Long term (current) use of anticoagulants: Secondary | ICD-10-CM | POA: Diagnosis not present

## 2014-08-02 DIAGNOSIS — Z885 Allergy status to narcotic agent status: Secondary | ICD-10-CM

## 2014-08-02 DIAGNOSIS — I350 Nonrheumatic aortic (valve) stenosis: Secondary | ICD-10-CM | POA: Diagnosis present

## 2014-08-02 DIAGNOSIS — Z90710 Acquired absence of both cervix and uterus: Secondary | ICD-10-CM

## 2014-08-02 DIAGNOSIS — E876 Hypokalemia: Secondary | ICD-10-CM | POA: Diagnosis present

## 2014-08-02 DIAGNOSIS — J9600 Acute respiratory failure, unspecified whether with hypoxia or hypercapnia: Secondary | ICD-10-CM | POA: Diagnosis present

## 2014-08-02 DIAGNOSIS — Z8542 Personal history of malignant neoplasm of other parts of uterus: Secondary | ICD-10-CM | POA: Diagnosis not present

## 2014-08-02 DIAGNOSIS — Z950 Presence of cardiac pacemaker: Secondary | ICD-10-CM | POA: Diagnosis not present

## 2014-08-02 DIAGNOSIS — K219 Gastro-esophageal reflux disease without esophagitis: Secondary | ICD-10-CM | POA: Diagnosis present

## 2014-08-02 DIAGNOSIS — I480 Paroxysmal atrial fibrillation: Secondary | ICD-10-CM | POA: Diagnosis present

## 2014-08-02 DIAGNOSIS — D649 Anemia, unspecified: Secondary | ICD-10-CM | POA: Diagnosis present

## 2014-08-02 DIAGNOSIS — F419 Anxiety disorder, unspecified: Secondary | ICD-10-CM | POA: Diagnosis present

## 2014-08-02 DIAGNOSIS — G629 Polyneuropathy, unspecified: Secondary | ICD-10-CM | POA: Diagnosis present

## 2014-08-02 DIAGNOSIS — I129 Hypertensive chronic kidney disease with stage 1 through stage 4 chronic kidney disease, or unspecified chronic kidney disease: Secondary | ICD-10-CM | POA: Diagnosis present

## 2014-08-02 DIAGNOSIS — I509 Heart failure, unspecified: Secondary | ICD-10-CM

## 2014-08-02 DIAGNOSIS — R0600 Dyspnea, unspecified: Secondary | ICD-10-CM

## 2014-08-02 LAB — BASIC METABOLIC PANEL
ANION GAP: 17 — AB (ref 5–15)
BUN: 19 mg/dL (ref 6–23)
CO2: 20 mEq/L (ref 19–32)
Calcium: 8.6 mg/dL (ref 8.4–10.5)
Chloride: 103 mEq/L (ref 96–112)
Creatinine, Ser: 0.89 mg/dL (ref 0.50–1.10)
GFR calc non Af Amer: 60 mL/min — ABNORMAL LOW (ref >90)
GFR, EST AFRICAN AMERICAN: 70 mL/min — AB (ref >90)
Glucose, Bld: 113 mg/dL — ABNORMAL HIGH (ref 70–99)
POTASSIUM: 3.6 meq/L — AB (ref 3.7–5.3)
SODIUM: 140 meq/L (ref 137–147)

## 2014-08-02 LAB — CBC WITH DIFFERENTIAL/PLATELET
BASOS PCT: 0 % (ref 0–1)
Basophils Absolute: 0 10*3/uL (ref 0.0–0.1)
EOS ABS: 0.3 10*3/uL (ref 0.0–0.7)
Eosinophils Relative: 3 % (ref 0–5)
HCT: 34.7 % — ABNORMAL LOW (ref 36.0–46.0)
Hemoglobin: 11.3 g/dL — ABNORMAL LOW (ref 12.0–15.0)
LYMPHS ABS: 1.7 10*3/uL (ref 0.7–4.0)
Lymphocytes Relative: 18 % (ref 12–46)
MCH: 30.5 pg (ref 26.0–34.0)
MCHC: 32.6 g/dL (ref 30.0–36.0)
MCV: 93.5 fL (ref 78.0–100.0)
Monocytes Absolute: 0.7 10*3/uL (ref 0.1–1.0)
Monocytes Relative: 8 % (ref 3–12)
NEUTROS PCT: 71 % (ref 43–77)
Neutro Abs: 6.8 10*3/uL (ref 1.7–7.7)
PLATELETS: 209 10*3/uL (ref 150–400)
RBC: 3.71 MIL/uL — AB (ref 3.87–5.11)
RDW: 15.9 % — ABNORMAL HIGH (ref 11.5–15.5)
WBC: 9.6 10*3/uL (ref 4.0–10.5)

## 2014-08-02 LAB — PRO B NATRIURETIC PEPTIDE: PRO B NATRI PEPTIDE: 11208 pg/mL — AB (ref 0–450)

## 2014-08-02 LAB — TROPONIN I
Troponin I: <0.3 ng/mL (ref <0.30)
Troponin I: <0.3 ng/mL (ref <0.30)

## 2014-08-02 LAB — PROTIME-INR
INR: 1.32 (ref 0.00–1.49)
Prothrombin Time: 16.5 seconds — ABNORMAL HIGH (ref 11.6–15.2)

## 2014-08-02 MED ORDER — WARFARIN SODIUM 2 MG PO TABS
2.0000 mg | ORAL_TABLET | Freq: Once | ORAL | Status: AC
  Administered 2014-08-02: 2 mg via ORAL
  Filled 2014-08-02: qty 1

## 2014-08-02 MED ORDER — LEVOTHYROXINE SODIUM 50 MCG PO TABS
50.0000 ug | ORAL_TABLET | Freq: Every day | ORAL | Status: DC
  Administered 2014-08-03 – 2014-08-05 (×3): 50 ug via ORAL
  Filled 2014-08-02 (×5): qty 1

## 2014-08-02 MED ORDER — POTASSIUM CHLORIDE CRYS ER 20 MEQ PO TBCR
20.0000 meq | EXTENDED_RELEASE_TABLET | Freq: Two times a day (BID) | ORAL | Status: DC
  Administered 2014-08-02 – 2014-08-05 (×6): 20 meq via ORAL
  Filled 2014-08-02 (×6): qty 1

## 2014-08-02 MED ORDER — FAMOTIDINE 20 MG PO TABS
20.0000 mg | ORAL_TABLET | Freq: Every day | ORAL | Status: DC
  Administered 2014-08-03 – 2014-08-05 (×3): 20 mg via ORAL
  Filled 2014-08-02 (×3): qty 1

## 2014-08-02 MED ORDER — ACETAMINOPHEN 325 MG PO TABS: 325.0000 mg | ORAL_TABLET | Freq: Four times a day (QID) | ORAL | Status: DC | PRN

## 2014-08-02 MED ORDER — PREGABALIN 75 MG PO CAPS
75.0000 mg | ORAL_CAPSULE | Freq: Two times a day (BID) | ORAL | Status: DC
  Administered 2014-08-02 – 2014-08-05 (×6): 75 mg via ORAL
  Filled 2014-08-02 (×7): qty 1

## 2014-08-02 MED ORDER — LOSARTAN POTASSIUM 50 MG PO TABS
100.0000 mg | ORAL_TABLET | Freq: Every morning | ORAL | Status: DC
  Administered 2014-08-03 – 2014-08-05 (×3): 100 mg via ORAL
  Filled 2014-08-02 (×3): qty 2

## 2014-08-02 MED ORDER — WARFARIN - PHARMACIST DOSING INPATIENT: Freq: Every day | Status: DC

## 2014-08-02 MED ORDER — METHOCARBAMOL 500 MG PO TABS
500.0000 mg | ORAL_TABLET | Freq: Four times a day (QID) | ORAL | Status: DC | PRN
  Administered 2014-08-02 – 2014-08-05 (×4): 500 mg via ORAL
  Filled 2014-08-02 (×4): qty 1

## 2014-08-02 MED ORDER — ATENOLOL 50 MG PO TABS
50.0000 mg | ORAL_TABLET | Freq: Two times a day (BID) | ORAL | Status: DC
  Administered 2014-08-02 – 2014-08-05 (×6): 50 mg via ORAL
  Filled 2014-08-02 (×7): qty 1

## 2014-08-02 MED ORDER — FUROSEMIDE 10 MG/ML IJ SOLN
40.0000 mg | Freq: Two times a day (BID) | INTRAMUSCULAR | Status: DC
  Administered 2014-08-02 – 2014-08-03 (×3): 40 mg via INTRAVENOUS
  Filled 2014-08-02 (×3): qty 4

## 2014-08-02 MED ORDER — ALPRAZOLAM 0.25 MG PO TABS
0.2500 mg | ORAL_TABLET | Freq: Three times a day (TID) | ORAL | Status: DC | PRN
  Administered 2014-08-02: 0.25 mg via ORAL
  Filled 2014-08-02 (×2): qty 1

## 2014-08-02 MED ORDER — POTASSIUM CHLORIDE CRYS ER 20 MEQ PO TBCR
40.0000 meq | EXTENDED_RELEASE_TABLET | Freq: Once | ORAL | Status: AC
  Administered 2014-08-02: 40 meq via ORAL
  Filled 2014-08-02: qty 2

## 2014-08-02 MED ORDER — NITROGLYCERIN 0.4 MG SL SUBL: 0.4000 mg | SUBLINGUAL_TABLET | SUBLINGUAL | Status: DC | PRN

## 2014-08-02 MED ORDER — SERTRALINE HCL 50 MG PO TABS
50.0000 mg | ORAL_TABLET | Freq: Every evening | ORAL | Status: DC
  Administered 2014-08-02 – 2014-08-04 (×3): 50 mg via ORAL
  Filled 2014-08-02 (×5): qty 1

## 2014-08-02 MED ORDER — NITROGLYCERIN 0.4 MG/HR TD PT24: 0.4000 mg | MEDICATED_PATCH | Freq: Every day | TRANSDERMAL | Status: DC | PRN

## 2014-08-02 MED ORDER — FUROSEMIDE 10 MG/ML IJ SOLN
40.0000 mg | Freq: Once | INTRAMUSCULAR | Status: AC
  Administered 2014-08-02: 40 mg via INTRAVENOUS
  Filled 2014-08-02: qty 4

## 2014-08-02 MED ORDER — ENOXAPARIN SODIUM 40 MG/0.4ML ~~LOC~~ SOLN
40.0000 mg | SUBCUTANEOUS | Status: DC
  Administered 2014-08-03 – 2014-08-05 (×3): 40 mg via SUBCUTANEOUS
  Filled 2014-08-02 (×3): qty 0.4

## 2014-08-02 NOTE — ED Notes (Signed)
MD at bedside. 

## 2014-08-02 NOTE — ED Notes (Signed)
Patient with c/o SOB and central CP during inspiration since last night Patient states that pain has improved with O2 LCTA bilaterally

## 2014-08-02 NOTE — H&P (Signed)
[facility] History and Physical  [name] NKN:[id number] DOB: 04/26/1934 DOA: 08/02/2014  Referring physician: EDP PCP: [name], MD   Chief Complaint: sob and chest tightness since last night.   HPI: [name] is a 80 y.o. female recently discharged from the hospital on Wednesday after undergoing repair of the  Knee tendons comes back with sudden sob with chest tightness started last night associated with palpitations. She denies chest pain. She denies fevers or chills. On arrival to ED, her pro bnp was elevated at 11,208 and labs revealed, mild anemia and hypokalemia. She was referred to medical service for admission foR CHF. Her CXR revealed mild CHF.    Review of Systems:  See hpi for pertinent positives. Other wise ROS negative.   Past Medical History  Diagnosis Date  . PAF (paroxysmal atrial fibrillation)     controlled with amiodarone, on coumadin  . CAD (coronary artery disease)   . Aortic root dilatation   . Moderate aortic stenosis   . Chronic anticoagulation   . Tachycardia-bradycardia syndrome     s/p PPM by Dr Doreatha Lew (MDT) 07/03/10  . GERD (gastroesophageal reflux disease)   . Pacemaker     2012  . Neuropathy     feet/legs  . Restless leg syndrome   . Headache(784.0)     hx migraines - takes Zoloft for migraines  . History of uterine cancer 1980s    treated with hysterectomy, external radiation and radiation seed implants.   . Cancer     hx Kidney cancer / hx of endometrial cancer  . PONV (postoperative nausea and vomiting)   . Anxiety   . Heart murmur   . HTN (hypertension)   . Hyperlipemia   . Hypothyroidism   . Osteomyelitis as child  . Family history of anesthesia complication     daughter has ponv  . Syncope 05/28/2014  . Hepatic steatosis   . Elevated LFTs   . Gastroparesis   . Anal fissure   . Radiation proctitis   . Gastric polyp     adenomatous  . Neuromuscular disorder     neuropathy in right leg  .  Chronic kidney disease     only has one kidney   . 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 Cardiac catheterization  2008  . Nephrectomy Left     renal cell cancer  . Total abdominal hysterectomy    . Patent ductus arterious repair  age 77  . Ptvp    . Pacemaker insertion  07/04/11    by Dr Doreatha Lew  . Cholecystectomy  1970s  . Upper gastrointestinal endoscopy    . Total knee arthroplasty  02/07/2012    Procedure: TOTAL KNEE ARTHROPLASTY;  Surgeon: Mauri Pole, MD;  Location: [facility];  Service: Orthopedics;  Laterality: Right;  . Insert / replace / remove pacemaker    . Joint replacement      rt total hip 2009  . I&d knee with poly exchange Right 12/17/2013    Procedure: IRRIGATION AND DEBRIDEMEN RIGHT TOTAL  KNEE WITH POLY EXCHANGE;  Surgeon: Mauri Pole, MD;  Location: [facility];  Service: Orthopedics;  Laterality: Right;  . Patellar tendon repair Right 03/06/2014    Procedure: AVULSION WITH PATELLA TENDON REPAIR;  Surgeon: Mauri Pole, MD;  Location: [facility];  Service: Orthopedics;  Laterality: Right;  . Total knee revision Right 07/29/2014    Procedure: RIGHT KNEE REVISION OF  PREVIOUS REPAIR EXTENSOR MECHANISM;  Surgeon: Mauri Pole, MD;  Location: [facility];  Service: Orthopedics;  Laterality: Right;   Social History:  reports that she has never smoked. She has never used smokeless tobacco. She reports that she does not drink alcohol or use illicit drugs.  Allergies  Allergen Reactions  . Codeine Nausea And Vomiting  . Hydrocodone Other (See Comments)    hallucination  . Morphine And Related Nausea And Vomiting  . Requip [Ropinirole Hcl]     Headache   . Zinc     nausea    Family History  Problem Relation Age of Onset  . Colon cancer Mother 31  . Heart disease Mother   . Heart disease Father   . Heart attack Father   . Heart disease Maternal Grandmother      Prior to Admission medications   Medication Sig Start Date End Date  Taking? Authorizing Provider  acetaminophen (TYLENOL) 325 MG tablet Take 1-2 tablets (325-650 mg total) by mouth every 6 (six) hours as needed for moderate pain. 07/31/14  Yes Lucille Passy Babish, PA-C  ALPRAZolam Duanne Moron) 0.5 MG tablet Take 0.25 mg by mouth 3 (three) times daily as needed for anxiety.    Yes Historical Provider, MD  atenolol (TENORMIN) 100 MG tablet Take 50 mg by mouth 2 (two) times daily.    Yes Historical Provider, MD  Biotin 5000 MCG CAPS Take 10,000 mcg by mouth every morning.    Yes Historical Provider, MD  Cholecalciferol (VITAMIN D PO) Take 5,000 Units by mouth every morning.    Yes Historical Provider, MD  enoxaparin (LOVENOX) 40 MG/0.4ML injection Inject 0.4 mLs (40 mg total) into the skin daily. 07/31/14  Yes Lucille Passy Babish, PA-C  ferrous sulfate 325 (65 FE) MG tablet Take 1 tablet (325 mg total) by mouth 3 (three) times daily after meals. 07/31/14  Yes Lucille Passy Babish, PA-C  furosemide (LASIX) 40 MG tablet Take 1 tablet (40 mg total) by mouth daily. Take AD Patient taking differently: Take 20-40 mg by mouth daily. Takes 1/2 tablet daily = 20mg  or a whole tablet= 40 mg if pt does not go to the bathroom on her own. 07/22/14  Yes [name], MD  levothyroxine (SYNTHROID, LEVOTHROID) 100 MCG tablet Take 50 mcg by mouth daily before breakfast.    Yes Historical Provider, MD  Loperamide HCl (IMODIUM A-D PO) Take 0.5-1 tablets by mouth every morning. Patient takes 1 tablet in the am and .5 tablet in the pm   Yes Historical Provider, MD  losartan (COZAAR) 100 MG tablet Take 1 tablet (100 mg total) by mouth daily. Patient taking differently: Take 100 mg by mouth every morning.  07/01/14  Yes Burtis Junes, NP  meclizine (ANTIVERT) 25 MG tablet Take 25 mg by mouth as needed for dizziness or nausea. 02/26/14 02/26/15 Yes [name], MD  methocarbamol (ROBAXIN) 500 MG tablet Take 1 tablet (500 mg total) by mouth every 6 (six) hours as needed for muscle spasms.  07/31/14  Yes Lucille Passy Babish, PA-C  nitroGLYCERIN (NITRODUR - DOSED IN MG/24 HR) 0.4 mg/hr patch Place 0.4 mg onto the skin daily as needed (chest pain).   Yes Historical Provider, MD  nitroGLYCERIN (NITROSTAT) 0.4 MG SL tablet Place 0.4 mg under the tongue every 5 (five) minutes as needed for chest pain.   Yes Historical Provider, MD  potassium chloride SA (K-DUR,KLOR-CON) 20 MEQ tablet Take 20 mEq by mouth 2 (two) times daily.   Yes Historical  Provider, MD  pregabalin (LYRICA) 75 MG capsule Take 75 mg by mouth 2 (two) times daily.   Yes Historical Provider, MD  ranitidine (ZANTAC) 150 MG tablet Take 150 mg by mouth 2 (two) times daily.    Yes Historical Provider, MD  sertraline (ZOLOFT) 50 MG tablet Take 50 mg by mouth every evening.    Yes Historical Provider, MD  traMADol (ULTRAM) 50 MG tablet Take 1-2 tablets (50-100 mg total) by mouth every 6 (six) hours as needed. Patient taking differently: Take 25 mg by mouth every 6 (six) hours as needed for moderate pain or severe pain.  07/31/14  Yes Lucille Passy Babish, PA-C  warfarin (COUMADIN) 2 MG tablet Take 2 mg by mouth daily.   Yes Historical Provider, MD   Physical Exam: Filed Vitals:   08/02/14 1300 08/02/14 1330 08/02/14 1331 08/02/14 1454  BP:   166/98 169/84  Pulse: 100 104 102 97  Temp:    97.8 F (36.6 C)  TempSrc:    Oral  Resp: 19 17 18 20   Height:    5\' 1"  (1.549 m)  Weight:    73 kg (160 lb 15 oz)  SpO2: 99% 98% 98% 98%    Wt Readings from Last 3 Encounters:  08/02/14 73 kg (160 lb 15 oz)  07/29/14 73.029 kg (161 lb)  07/22/14 73.029 kg (161 lb)    General:  Appears calm and comfortable Eyes: PERRL, normal lids, irises & conjunctiva Neck: no LAD, masses or thyromegaly Cardiovascular: tachycardia, mild LE edema. Respiratory: CTA bilaterally, no w/r/r. Normal respiratory effort. Abdomen: soft, ntnd Skin: no rash or induration seen on limited exam Musculoskeletal: grossly normal tone BUE/BLE Neurologic:  grossly non-focal.          Labs on Admission:  Basic Metabolic Panel:  Recent Labs Lab 07/29/14 1806 07/30/14 0515 07/31/14 0456 08/02/14 1116  NA  --  139 140 140  K  --  3.9 4.2 3.6*  CL  --  103 104 103  CO2  --  24 22 20   GLUCOSE  --  141* 129* 113*  BUN  --  15 21 19   CREATININE 1.03 1.01 1.35* 0.89  CALCIUM  --  8.6 8.5 8.6   Liver Function Tests: No results for input(s): AST, ALT, ALKPHOS, BILITOT, PROT, ALBUMIN in the last 168 hours. No results for input(s): LIPASE, AMYLASE in the last 168 hours. No results for input(s): AMMONIA in the last 168 hours. CBC:  Recent Labs Lab 07/29/14 1806 07/30/14 0515 07/31/14 0456 08/02/14 1116  WBC 4.5 6.4 11.0* 9.6  NEUTROABS  --   --   --  6.8  HGB 11.5* 10.7* 10.4* 11.3*  HCT 36.6 32.8* 31.7* 34.7*  MCV 93.8 91.9 92.4 93.5  PLT 151 177 181 209   Cardiac Enzymes:  Recent Labs Lab 08/02/14 1116 08/02/14 1745  TROPONINI <0.30 <0.30    BNP (last 3 results)  Recent Labs  08/02/14 1116  PROBNP 11208.0*   CBG: No results for input(s): GLUCAP in the last 168 hours.  Radiological Exams on Admission: Dg Chest Port 1 View  08/02/2014   CLINICAL DATA:  Sternal chest pain and shortness of breath since last night.  EXAM: PORTABLE CHEST - 1 VIEW  COMPARISON:  Previous examinations, the most recent dated 05/28/2014.  FINDINGS: Interval borderline enlarged cardiac silhouette. Stable right subclavian pacemaker leads. Increased prominence of the pulmonary vasculature and interstitial markings. Mild bibasilar airspace opacity. Probable small left pleural effusion. Diffuse osteopenia.  IMPRESSION:  1. Interval borderline cardiomegaly and mild changes of acute congestive heart failure. 2. Mild bibasilar airspace opacity, most likely representing atelectasis.   Electronically Signed   By: Enrique Sack M.D.   On: 08/02/2014 11:45    EKG: atrial fib with rate 104/min.   Assessment/Plan Active Problems:   CHF exacerbation    Acute exacerbation of CHF (congestive heart failure)   Acute diastolic heart failure Admitted to telemetry. Started on IV lasix, replete electrolytes as needed.  Sl NTG as  Needed.  Serial troponins and repeat EKG in am.  Echocardiogram in September 2015 showed good LVEF .  Get d dimer and if elevated get CT angio of the chest to evaluate for PE.   Atrial fibrillation Rate controlled. Resume atenolol at home dose.  Amiodarone held for elevated liver function panel. Resume coumadin as per pharmacy.   Recent knee surgery Pain control and physical therapy.  lovenox as per orthopedics.   Hypothyroidism Resume synthroid  Hypokalemia  Replete as needed.     Code Status: full code.  DVT Prophylaxis: Family Communication: family at bedside. Disposition Plan: admit to telemetry.  Time spent: 55 min  [facility] Pager [phone]

## 2014-08-02 NOTE — Progress Notes (Addendum)
ANTICOAGULATION CONSULT NOTE - Initial Consult  Pharmacy Consult for warfarin Indication: atrial fibrillation and VTE prophylaxis  Allergies  Allergen Reactions  . Codeine Nausea And Vomiting  . Hydrocodone Other (See Comments)    hallucination  . Morphine And Related Nausea And Vomiting  . Requip [Ropinirole Hcl]     Headache   . Zinc     nausea    Patient Measurements: Height: 5\' 1"  (154.9 cm) Weight: 160 lb 15 oz (73 kg) IBW/kg (Calculated) : 47.8 Heparin Dosing Weight:   Vital Signs: Temp: 97.8 F (36.6 C) (11/27 1454) Temp Source: Oral (11/27 1454) BP: 169/84 mmHg (11/27 1454) Pulse Rate: 97 (11/27 1454)  Labs:  Recent Labs  07/31/14 0456 08/02/14 1116  HGB 10.4* 11.3*  HCT 31.7* 34.7*  PLT 181 209  LABPROT 15.4* 16.5*  INR 1.20 1.32  CREATININE 1.35* 0.89  TROPONINI  --  <0.30    Estimated Creatinine Clearance: 47.6 mL/min (by C-G formula based on Cr of 0.89).   Medical History: Past Medical History  Diagnosis Date  . PAF (paroxysmal atrial fibrillation)     controlled with amiodarone, on coumadin  . CAD (coronary artery disease)   . Aortic root dilatation   . Moderate aortic stenosis   . Chronic anticoagulation   . Tachycardia-bradycardia syndrome     s/p PPM by Dr Doreatha Lew (MDT) 07/03/10  . GERD (gastroesophageal reflux disease)   . Pacemaker     2012  . Neuropathy     feet/legs  . Restless leg syndrome   . Headache(784.0)     hx migraines - takes Zoloft for migraines  . History of uterine cancer 1980s    treated with hysterectomy, external radiation and radiation seed implants.   . Cancer     hx Kidney cancer / hx of endometrial cancer  . PONV (postoperative nausea and vomiting)   . Anxiety   . Heart murmur   . HTN (hypertension)   . Hyperlipemia   . Hypothyroidism   . Osteomyelitis as child  . Family history of anesthesia complication     daughter has ponv  . Syncope 05/28/2014  . Hepatic steatosis   . Elevated LFTs   .  Gastroparesis   . Anal fissure   . Radiation proctitis   . Gastric polyp     adenomatous  . Neuromuscular disorder     neuropathy in right leg  . Chronic kidney disease     only has one kidney   . 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 She was discharged 11/25 on enoxaparin 40mg  SQ daily and warfarin. She returns to ED 11/27 with chest pressure and dyspnea with BNP and CXR consistent with CHF exacerbation. Pt's warfarin is managed at [facility] (not seen since discharge).  Pharmacy is now consulted to restart warfarin therapy. Enoxaparin 40mg  SQ q24h was resumed.   Home warfarin dose:  2mg  daily with last dose 11/26  Today, 11/27:  INR = 1.32 today  CBC: Hgb = 11.3 and pltc WNL  Diet: cardiac diet  Drug interactions: no major interactions  Goal of Therapy:  INR 2-3 Monitor platelets by anticoagulation protocol: Yes   Plan:   Warfarin 2mg  PO x 1 today - use home dose vs.Higher dose despite subtherapeutic INR due to acute illness and suspect warfarin sensitivity with acute HF with possible liver congestion.  Daily INR  Stop enoxaparin when INR >=  [place], PharmD, BCPS.   Pager: [phone]  08/02/2014,5:53 PM

## 2014-08-02 NOTE — Telephone Encounter (Signed)
New message    [facility] calling    Patient C/O chest pain feel like elephant sitting on chest . Daughter said she look swollen. 40 mg of lasix given  , sob.    Asking for lab to be drawn.   Bnp, CMP, cbc with diff, chest x ray to see if she has pneumonia, CHF extubation.     Nurse is in route to go see the patient.

## 2014-08-02 NOTE — ED Notes (Addendum)
Patient arrives via [facility] due to c/o SOB and "painful breathing" Patient recently had right knee surgery on 11/23 and patient's anticoagulants were stopped on 11/15 (Coumadin) and restarted after surgery completed--Patient taking Lovenox until INR therapeutic SOB and pain started last night Pain resolved with [place] Patient with hx of A-fib  Right leg immobilized since Monday Patient arrives alert and oriented x 4

## 2014-08-02 NOTE — ED Notes (Addendum)
Family members at bedside have been extremely rude towards nursing staff Family members stated, "The doctor said they would be back to tell us what the scan said." Family members demanding that ED techs and RNs tell them the results of imaging and labs Family members informed that labs are still in process, thus the results are pending Family informed that the EDP will be made aware that they are requesting the results from imaging Will inform EDP and ED Charge of family members

## 2014-08-02 NOTE — Plan of Care (Signed)
Problem: Phase I Progression Outcomes Goal: Pain controlled with appropriate interventions Outcome: Completed/Met Date Met:  08/02/14     

## 2014-08-02 NOTE — ED Notes (Signed)
CXR at bedside

## 2014-08-02 NOTE — Progress Notes (Signed)
[facility]  Patient Status: Active (receiving services up to time of hospitalization)  [facility] is providing the following services: RN and PT  If patient discharges after hours, please call [phone].   [name] 08/02/2014, 3:44 PM

## 2014-08-02 NOTE — Progress Notes (Signed)
Ur completed     

## 2014-08-02 NOTE — Telephone Encounter (Signed)
Just received a call from Sharon with [place] [facility]. The  Patient's symptoms are getting worse. She has Sob and chest tightness. Is recovering from orthopedic surgery on her knee. Traci  feels like she may have a PE and they are transporting her to [facility] ED now.

## 2014-08-02 NOTE — ED Provider Notes (Signed)
CSN: [id number]     Arrival date & time 08/02/14  1034 History   First MD Initiated Contact with Patient 08/02/14 1051     Chief Complaint  Patient presents with  . Shortness of Breath     (Consider location/radiation/quality/duration/timing/severity/associated sxs/prior Treatment) Patient is a 80 y.o. female presenting with shortness of breath. The history is provided by the patient.  Shortness of Breath She noted onset during the night of dyspnea with associated tightness in her chest. She states she had to sit up in order to breathe. There is minimal cough. She denies fever, chills, sweats. She denies nausea, vomiting. She is 3 days postop repair of patellar tendon of her right knee. She had been on warfarin and she is currently on Lovenox for bridging until warfarin level his back therapeutic. She denies any chest pain or heaviness.  Past Medical History  Diagnosis Date  . PAF (paroxysmal atrial fibrillation)     controlled with amiodarone, on coumadin  . CAD (coronary artery disease)   . Aortic root dilatation   . Moderate aortic stenosis   . Chronic anticoagulation   . Tachycardia-bradycardia syndrome     s/p PPM by Dr Doreatha Lew (MDT) 07/03/10  . GERD (gastroesophageal reflux disease)   . Pacemaker     2012  . Neuropathy     feet/legs  . Restless leg syndrome   . Headache(784.0)     hx migraines - takes Zoloft for migraines  . History of uterine cancer 1980s    treated with hysterectomy, external radiation and radiation seed implants.   . Cancer     hx Kidney cancer / hx of endometrial cancer  . PONV (postoperative nausea and vomiting)   . Anxiety   . Heart murmur   . HTN (hypertension)   . Hyperlipemia   . Hypothyroidism   . Osteomyelitis as child  . Family history of anesthesia complication     daughter has ponv  . Syncope 05/28/2014  . Hepatic steatosis   . Elevated LFTs   . Gastroparesis   . Anal fissure   . Radiation proctitis   . Gastric polyp    adenomatous  . Neuromuscular disorder     neuropathy in right leg  . Chronic kidney disease     only has one kidney   . 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 Cardiac catheterization  2008  . Nephrectomy Left     renal cell cancer  . Total abdominal hysterectomy    . Patent ductus arterious repair  age 66  . Ptvp    . Pacemaker insertion  07/04/11    by Dr Doreatha Lew  . Cholecystectomy  1970s  . Upper gastrointestinal endoscopy    . Total knee arthroplasty  02/07/2012    Procedure: TOTAL KNEE ARTHROPLASTY;  Surgeon: Mauri Pole, MD;  Location: [facility];  Service: Orthopedics;  Laterality: Right;  . Insert / replace / remove pacemaker    . Joint replacement      rt total hip 2009  . I&d knee with poly exchange Right 12/17/2013    Procedure: IRRIGATION AND DEBRIDEMEN RIGHT TOTAL  KNEE WITH POLY EXCHANGE;  Surgeon: Mauri Pole, MD;  Location: [facility];  Service: Orthopedics;  Laterality: Right;  . Patellar tendon repair Right 03/06/2014    Procedure: AVULSION WITH PATELLA TENDON REPAIR;  Surgeon: Mauri Pole, MD;  Location: [facility];  Service: Orthopedics;  Laterality: Right;  . Total  knee revision Right 07/29/2014    Procedure: RIGHT KNEE REVISION OF PREVIOUS REPAIR EXTENSOR MECHANISM;  Surgeon: Mauri Pole, MD;  Location: [facility];  Service: Orthopedics;  Laterality: Right;   Family History  Problem Relation Age of Onset  . Colon cancer Mother 78  . Heart disease Mother   . Heart disease Father   . Heart attack Father   . Heart disease Maternal Grandmother    History  Substance Use Topics  . Smoking status: Never Smoker   . Smokeless tobacco: Never Used  . Alcohol Use: No   OB History    No data available     Review of Systems  Respiratory: Positive for shortness of breath.   All other systems reviewed and are negative.     Allergies  Codeine; Hydrocodone; Morphine and related; Requip; and Zinc  Home Medications   Prior to  Admission medications   Medication Sig Start Date End Date Taking? Authorizing Provider  acetaminophen (TYLENOL) 325 MG tablet Take 1-2 tablets (325-650 mg total) by mouth every 6 (six) hours as needed for moderate pain. 07/31/14   Lucille Passy Babish, PA-C  ALPRAZolam Duanne Moron) 0.5 MG tablet Take 0.5 mg by mouth 3 (three) times daily as needed for anxiety.     Historical Provider, MD  atenolol (TENORMIN) 100 MG tablet Take 50 mg by mouth 2 (two) times daily.     Historical Provider, MD  Biotin 5000 MCG CAPS Take 10,000 mcg by mouth every morning.     Historical Provider, MD  Cholecalciferol (VITAMIN D PO) Take 5,000 Units by mouth every morning.     Historical Provider, MD  cholestyramine Lucrezia Starch) 4 G packet TAKE ONE DOSE DAILY AT LUNCHTIME 07/29/14   Jessica D. Zehr, PA-C  docusate sodium 100 MG CAPS Take 100 mg by mouth 2 (two) times daily. 07/31/14   Lucille Passy Babish, PA-C  enoxaparin (LOVENOX) 40 MG/0.4ML injection Inject 0.4 mLs (40 mg total) into the skin daily. 07/31/14   Lucille Passy Babish, PA-C  ferrous sulfate 325 (65 FE) MG tablet Take 1 tablet (325 mg total) by mouth 3 (three) times daily after meals. 07/31/14   Lucille Passy Babish, PA-C  furosemide (LASIX) 40 MG tablet Take 1 tablet (40 mg total) by mouth daily. Take AD Patient taking differently: Take 40 mg by mouth every other day. Take AD 07/22/14   [name], MD  levothyroxine (SYNTHROID, LEVOTHROID) 100 MCG tablet Take 50 mcg by mouth daily before breakfast.     Historical Provider, MD  Loperamide HCl (IMODIUM A-D PO) Take 1 tablet by mouth every morning. Patient takes 1 tablet in the am and .5 tablet in the pm    Historical Provider, MD  losartan (COZAAR) 100 MG tablet Take 1 tablet (100 mg total) by mouth daily. Patient taking differently: Take 100 mg by mouth every morning.  07/01/14   Burtis Junes, NP  Magnesium 250 MG TABS Take 250 mg by mouth daily.     Historical Provider, MD  meclizine (ANTIVERT) 25 MG  tablet Take 25 mg by mouth as needed for dizziness or nausea. 02/26/14 02/26/15  [name], MD  methocarbamol (ROBAXIN) 500 MG tablet Take 1 tablet (500 mg total) by mouth every 6 (six) hours as needed for muscle spasms. 07/31/14   Lucille Passy Babish, PA-C  nitroGLYCERIN (NITRODUR - DOSED IN MG/24 HR) 0.4 mg/hr patch Place 0.4 mg onto the skin daily as needed (chest pain).    Historical Provider, MD  nitroGLYCERIN (  NITROSTAT) 0.4 MG SL tablet Place 0.4 mg under the tongue every 5 (five) minutes as needed for chest pain.    Historical Provider, MD  polyethylene glycol (MIRALAX / GLYCOLAX) packet Take 17 g by mouth 2 (two) times daily. 07/31/14   Lucille Passy Babish, PA-C  potassium chloride SA (K-DUR,KLOR-CON) 20 MEQ tablet Take 20 mEq by mouth 2 (two) times daily.    Historical Provider, MD  pregabalin (LYRICA) 75 MG capsule Take 75 mg by mouth 2 (two) times daily.    Historical Provider, MD  ranitidine (ZANTAC) 150 MG tablet Take 150 mg by mouth 2 (two) times daily.     Historical Provider, MD  sertraline (ZOLOFT) 50 MG tablet Take 50 mg by mouth every evening.     Historical Provider, MD  traMADol (ULTRAM) 50 MG tablet Take 1-2 tablets (50-100 mg total) by mouth every 6 (six) hours as needed. 07/31/14   Lucille Passy Babish, PA-C  warfarin (COUMADIN) 1 MG tablet Take 1 tablet (1 mg total) by mouth daily. 07/31/14   Lucille Passy Babish, PA-C   BP 159/92 mmHg  Pulse 100  Temp(Src) 97.4 F (36.3 C) (Oral)  Resp 22  SpO2 95% Physical Exam  Nursing note and vitals reviewed.  80 year old female, resting comfortably and in no acute distress. Vital signs are significant for tachypnea and hypertension. Oxygen saturation is 90%, which is hypoxic, but comes up to 95% with supplemental nasal oxygen. Head is normocephalic and atraumatic. PERRLA, EOMI. Oropharynx is clear. Neck is nontender and supple without adenopathy. JVD is present at 90. Back is nontender and there is no CVA tenderness. 1+  presacral edema is present. Lungs have a few rales present at the right base. There are no wheezes or rhonchi. Chest is nontender. Heart has regular rate and rhythm without murmur. Abdomen is soft, flat, nontender without masses or hepatosplenomegaly and peristalsis is normoactive. Extremities have trace edema. Right leg is immobilized in a knee immobilizer. Skin is warm and dry without rash. Neurologic: Mental status is normal, cranial nerves are intact, there are no motor or sensory deficits.  ED Course  Procedures (including critical care time) Labs Review Results for orders placed or performed during the hospital encounter of 08/02/14  CBC with Differential  Result Value Ref Range   WBC 9.6 4.0 - 10.5 K/uL   RBC 3.71 (L) 3.87 - 5.11 MIL/uL   Hemoglobin 11.3 (L) 12.0 - 15.0 g/dL   HCT 34.7 (L) 36.0 - 46.0 %   MCV 93.5 78.0 - 100.0 fL   MCH 30.5 26.0 - 34.0 pg   MCHC 32.6 30.0 - 36.0 g/dL   RDW 15.9 (H) 11.5 - 15.5 %   Platelets 209 150 - 400 K/uL   Neutrophils Relative % 71 43 - 77 %   Neutro Abs 6.8 1.7 - 7.7 K/uL   Lymphocytes Relative 18 12 - 46 %   Lymphs Abs 1.7 0.7 - 4.0 K/uL   Monocytes Relative 8 3 - 12 %   Monocytes Absolute 0.7 0.1 - 1.0 K/uL   Eosinophils Relative 3 0 - 5 %   Eosinophils Absolute 0.3 0.0 - 0.7 K/uL   Basophils Relative 0 0 - 1 %   Basophils Absolute 0.0 0.0 - 0.1 K/uL  Basic metabolic panel  Result Value Ref Range   Sodium 140 137 - 147 mEq/L   Potassium 3.6 (L) 3.7 - 5.3 mEq/L   Chloride 103 96 - 112 mEq/L   CO2 20 19 -  32 mEq/L   Glucose, Bld 113 (H) 70 - 99 mg/dL   BUN 19 6 - 23 mg/dL   Creatinine, Ser 0.89 0.50 - 1.10 mg/dL   Calcium 8.6 8.4 - 10.5 mg/dL   GFR calc non Af Amer 60 (L) >90 mL/min   GFR calc Af Amer 70 (L) >90 mL/min   Anion gap 17 (H) 5 - 15  Troponin I  Result Value Ref Range   Troponin I <0.30 <0.30 ng/mL  Pro b natriuretic peptide (BNP)  Result Value Ref Range   Pro B Natriuretic peptide (BNP) 11208.0 (H) 0 - 450  pg/mL  Protime-INR  Result Value Ref Range   Prothrombin Time 16.5 (H) 11.6 - 15.2 seconds   INR 1.32 0.00 - 1.49   Imaging Review Dg Chest Port 1 View  08/02/2014   CLINICAL DATA:  Sternal chest pain and shortness of breath since last night.  EXAM: PORTABLE CHEST - 1 VIEW  COMPARISON:  Previous examinations, the most recent dated 05/28/2014.  FINDINGS: Interval borderline enlarged cardiac silhouette. Stable right subclavian pacemaker leads. Increased prominence of the pulmonary vasculature and interstitial markings. Mild bibasilar airspace opacity. Probable small left pleural effusion. Diffuse osteopenia.  IMPRESSION: 1. Interval borderline cardiomegaly and mild changes of acute congestive heart failure. 2. Mild bibasilar airspace opacity, most likely representing atelectasis.   Electronically Signed   By: Enrique Sack M.D.   On: 08/02/2014 11:45     EKG Interpretation   Date/Time:  Friday August 02 2014 10:51:39 EST Ventricular Rate:  104 PR Interval:    QRS Duration: 85 QT Interval:  405 QTC Calculation: 533 R Axis:   38 Text Interpretation:  Atrial fibrillation Nonspecific repol abnormality,  diffuse leads Prolonged QT interval When compared with ECG of 05/28/2014,  No significant change was found Confirmed by [facility]  MD, Cayleen Benjamin ([id number]) on  08/02/2014 10:55:51 AM      MDM   Final diagnoses:  Dyspnea  CHF exacerbation  Paroxysmal atrial fibrillation  Hypokalemia  Normochromic normocytic anemia   Dyspnea and patient was 3 days post surgery on her right knee. She is at low risk for pulmonary embolism because of anticoagulation with enoxaparin. Chest x-ray will be obtained and she will be evaluated for heart failure with BNP level. Old records to show a history of paroxysmal atrial fibrillation. I suspect she has some degree of fluid overload related to fluids received during surgery and salt intake during Thanksgiving dinner.  Family has arrived and states that patient accident  did not have anything out of the ordinary to eat for Thanksgiving. BNP has come back significantly elevated and chest x-ray is consistent with congestive heart failure. She is given a dose of furosemide.  She continues to feel dyspneic despite diuresis. Case is discussed with Dr. Karleen Hampshire of [facility] who agrees to admit the patient.  Delora Fuel, MD 56/97/94 [id number]

## 2014-08-03 ENCOUNTER — Encounter (HOSPITAL_COMMUNITY): Payer: Self-pay | Admitting: Radiology

## 2014-08-03 ENCOUNTER — Inpatient Hospital Stay (HOSPITAL_COMMUNITY): Payer: Medicare Other

## 2014-08-03 LAB — BASIC METABOLIC PANEL
Anion gap: 13 (ref 5–15)
BUN: 18 mg/dL (ref 6–23)
CO2: 28 mEq/L (ref 19–32)
Calcium: 8.5 mg/dL (ref 8.4–10.5)
Chloride: 103 mEq/L (ref 96–112)
Creatinine, Ser: 1.11 mg/dL — ABNORMAL HIGH (ref 0.50–1.10)
GFR calc Af Amer: 54 mL/min — ABNORMAL LOW (ref >90)
GFR calc non Af Amer: 46 mL/min — ABNORMAL LOW (ref >90)
Glucose, Bld: 86 mg/dL (ref 70–99)
POTASSIUM: 3.5 meq/L — AB (ref 3.7–5.3)
Sodium: 144 mEq/L (ref 137–147)

## 2014-08-03 LAB — PROTIME-INR
INR: 1.24 (ref 0.00–1.49)
PROTHROMBIN TIME: 15.7 s — AB (ref 11.6–15.2)

## 2014-08-03 LAB — TSH: TSH: 2.33 u[IU]/mL (ref 0.350–4.500)

## 2014-08-03 LAB — D-DIMER, QUANTITATIVE (NOT AT ARMC): D DIMER QUANT: 0.64 ug{FEU}/mL — AB (ref 0.00–0.48)

## 2014-08-03 LAB — MAGNESIUM: Magnesium: 1.3 mg/dL — ABNORMAL LOW (ref 1.5–2.5)

## 2014-08-03 LAB — TROPONIN I: Troponin I: <0.3 ng/mL (ref <0.30)

## 2014-08-03 LAB — CLOSTRIDIUM DIFFICILE BY PCR: CDIFFPCR: NEGATIVE

## 2014-08-03 MED ORDER — WARFARIN SODIUM 2 MG PO TABS
2.0000 mg | ORAL_TABLET | Freq: Once | ORAL | Status: AC
Start: 2014-08-03 — End: 2014-08-03
  Administered 2014-08-03: 2 mg via ORAL
  Filled 2014-08-03: qty 1

## 2014-08-03 MED ORDER — LEVOFLOXACIN IN D5W 750 MG/150ML IV SOLN
750.0000 mg | INTRAVENOUS | Status: DC
  Administered 2014-08-03: 750 mg via INTRAVENOUS
  Filled 2014-08-03: qty 150

## 2014-08-03 MED ORDER — IOHEXOL 350 MG/ML SOLN
100.0000 mL | Freq: Once | INTRAVENOUS | Status: AC | PRN
  Administered 2014-08-03: 80 mL via INTRAVENOUS

## 2014-08-03 NOTE — Progress Notes (Signed)
[facility] PROGRESS NOTE  JACORA HOPKINS SWF:[id number] DOB: [birth year] DOA: 08/02/2014 PCP: [name], MD  Assessment/Plan: Acute diastolic heart failure Admitted to telemetry. Started on IV lasix, replete electrolytes as needed.  Serial troponins negative and repeat EKG in am.  Echocardiogram in September 2015 showed good LVEF .  Get d dimer and if elevated get CT angio of the chest to evaluate for PE.   Atrial fibrillation Rate controlled. Resume atenolol at home dose.  Amiodarone held for elevated liver function panel. Resume coumadin as per pharmacy.   Recent knee surgery Pain control and physical therapy.  lovenox as per orthopedics.   Hypothyroidism Resume synthroid  Hypokalemia  Replete as needed.    Code Status: full code.  Family Communication: family at bedside Disposition Plan: pending.    Consultants:  none  Procedures:  CT angio  Antibiotics:  none  HPI/Subjective:feeling much better.   Objective: Filed Vitals:   08/03/14 0508  BP: 151/49  Pulse: 66  Temp: 97.8 F (36.6 C)  Resp: 18    Intake/Output Summary (Last 24 hours) at 08/03/14 1313 Last data filed at 08/02/14 2110  Gross per 24 hour  Intake      0 ml  Output   2500 ml  Net  -2500 ml   Filed Weights   08/02/14 1454 08/03/14 0508  Weight: 73 kg (160 lb 15 oz) 72.4 kg (159 lb 9.8 oz)    Exam:   General:  Alert afebrile comfortable  Cardiovascular: s1s2  Respiratory: CTAB  Abdomen: soft non distended non tender bowel sounds heard  Musculoskeletal: no pedal edmea.   Data Reviewed: Basic Metabolic Panel:  Recent Labs Lab 07/29/14 1806 07/30/14 0515 07/31/14 0456 08/02/14 1116 08/03/14 0648  NA  --  139 140 140 144  K  --  3.9 4.2 3.6* 3.5*  CL  --  103 104 103 103  CO2  --  24 22 20 28   GLUCOSE  --  141* 129* 113* 86  BUN  --  15 21 19 18   CREATININE 1.03 1.01 1.35* 0.89 1.11*  CALCIUM  --  8.6 8.5 8.6 8.5  MG  --   --   --    --  1.3*   Liver Function Tests: No results for input(s): AST, ALT, ALKPHOS, BILITOT, PROT, ALBUMIN in the last 168 hours. No results for input(s): LIPASE, AMYLASE in the last 168 hours. No results for input(s): AMMONIA in the last 168 hours. CBC:  Recent Labs Lab 07/29/14 1806 07/30/14 0515 07/31/14 0456 08/02/14 1116  WBC 4.5 6.4 11.0* 9.6  NEUTROABS  --   --   --  6.8  HGB 11.5* 10.7* 10.4* 11.3*  HCT 36.6 32.8* 31.7* 34.7*  MCV 93.8 91.9 92.4 93.5  PLT 151 177 181 209   Cardiac Enzymes:  Recent Labs Lab 08/02/14 1116 08/02/14 1745 08/03/14 0025 08/03/14 0648  TROPONINI <0.30 <0.30 <0.30 <0.30   BNP (last 3 results)  Recent Labs  08/02/14 1116  PROBNP 11208.0*   CBG: No results for input(s): GLUCAP in the last 168 hours.  No results found for this or any previous visit (from the past 240 hour(s)).   Studies: Dg Chest Port 1 View  08/02/2014   CLINICAL DATA:  Sternal chest pain and shortness of breath since last night.  EXAM: PORTABLE CHEST - 1 VIEW  COMPARISON:  Previous examinations, the most recent dated 05/28/2014.  FINDINGS: Interval borderline enlarged cardiac silhouette. Stable right subclavian pacemaker leads. Increased prominence  of the pulmonary vasculature and interstitial markings. Mild bibasilar airspace opacity. Probable small left pleural effusion. Diffuse osteopenia.  IMPRESSION: 1. Interval borderline cardiomegaly and mild changes of acute congestive heart failure. 2. Mild bibasilar airspace opacity, most likely representing atelectasis.   Electronically Signed   By: Enrique Sack M.D.   On: 08/02/2014 11:45    Scheduled Meds: . atenolol  50 mg Oral BID  . enoxaparin  40 mg Subcutaneous Q24H  . famotidine  20 mg Oral Daily  . furosemide  40 mg Intravenous Q12H  . levothyroxine  50 mcg Oral QAC breakfast  . losartan  100 mg Oral q morning - 10a  . potassium chloride SA  20 mEq Oral BID  . pregabalin  75 mg Oral BID  . sertraline  50 mg Oral  QPM  . warfarin  2 mg Oral ONCE-1800  . Warfarin - Pharmacist Dosing Inpatient   Does not apply q1800   Continuous Infusions:   Active Problems:   CHF exacerbation   Acute exacerbation of CHF (congestive heart failure)    Time spent: 20 min    Bear Osten  [facility] Pager [phone] If 7PM-7AM, please contact night-coverage at [email or web address], password [facility] 08/03/2014, 1:13 PM  LOS: 1 day

## 2014-08-03 NOTE — Plan of Care (Signed)
Problem: Phase I Progression Outcomes Goal: Voiding-avoid urinary catheter unless indicated Outcome: Completed/Met Date Met:  08/03/14     

## 2014-08-03 NOTE — Progress Notes (Signed)
[place] for warfarin Indication: atrial fibrillation and VTE prophylaxis  Allergies  Allergen Reactions  . Codeine Nausea And Vomiting  . Hydrocodone Other (See Comments)    hallucination  . Morphine And Related Nausea And Vomiting  . Requip [Ropinirole Hcl]     Headache   . Zinc     nausea    Patient Measurements: Height: 5\' 1"  (154.9 cm) Weight: 159 lb 9.8 oz (72.4 kg) IBW/kg (Calculated) : 47.8  Vital Signs: Temp: 97.8 F (36.6 C) (11/28 0508) Temp Source: Oral (11/28 0508) BP: 151/49 mmHg (11/28 0508) Pulse Rate: 66 (11/28 0508)  Labs:  Recent Labs  08/02/14 1116 08/02/14 1745 08/03/14 0025 08/03/14 0648  HGB 11.3*  --   --   --   HCT 34.7*  --   --   --   PLT 209  --   --   --   LABPROT 16.5*  --   --  15.7*  INR 1.32  --   --  1.24  CREATININE 0.89  --   --  1.11*  TROPONINI <0.30 <0.30 <0.30 <0.30    Estimated Creatinine Clearance: 38 mL/min (by C-G formula based on Cr of 1.11).   Medical History: Past Medical History  Diagnosis Date  . PAF (paroxysmal atrial fibrillation)     controlled with amiodarone, on coumadin  . CAD (coronary artery disease)   . Aortic root dilatation   . Moderate aortic stenosis   . Chronic anticoagulation   . Tachycardia-bradycardia syndrome     s/p PPM by Dr Doreatha Lew (MDT) 07/03/10  . GERD (gastroesophageal reflux disease)   . Pacemaker     2012  . Neuropathy     feet/legs  . Restless leg syndrome   . Headache(784.0)     hx migraines - takes Zoloft for migraines  . History of uterine cancer 1980s    treated with hysterectomy, external radiation and radiation seed implants.   . Cancer     hx Kidney cancer / hx of endometrial cancer  . PONV (postoperative nausea and vomiting)   . Anxiety   . Heart murmur   . HTN (hypertension)   . Hyperlipemia   . Hypothyroidism   . Osteomyelitis as child  . Family history of anesthesia complication     daughter has ponv  . Syncope  05/28/2014  . Hepatic steatosis   . Elevated LFTs   . Gastroparesis   . Anal fissure   . Radiation proctitis   . Gastric polyp     adenomatous  . Neuromuscular disorder     neuropathy in right leg  . Chronic kidney disease     only has one kidney   . 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 She was discharged 11/25 on enoxaparin 40mg  SQ daily and warfarin. She returns to ED 11/27 with chest pressure and dyspnea with BNP and CXR consistent with CHF exacerbation. Pt's warfarin is managed at [facility] (not seen since discharge).  Pharmacy is now consulted to restart warfarin therapy. Enoxaparin 40mg  SQ q24h was resumed.   Home warfarin dose:  2mg  daily with last dose 11/26  Inpatient warfarin doses: 2mg  (11/27)  Today, 08/03/2014:  INR = 1.24 today  CBC: Hgb = 11.3 and pltc WNL (11/27)  Diet: cardiac diet  Drug interactions: no major interactions  Goal of Therapy:  INR 2-3 Monitor platelets by anticoagulation  protocol: Yes   Plan:   Warfarin 2mg  PO x 1 today - use home dose vs.Higher dose despite subtherapeutic INR due to acute illness and suspect warfarin sensitivity with acute HF with possible liver congestion.  Daily INR  Stop enoxaparin when INR >= 2  Kizzie Furnish, PharmD Pager: [phone] 08/03/2014 8:17 AM

## 2014-08-04 LAB — BASIC METABOLIC PANEL
Anion gap: 16 — ABNORMAL HIGH (ref 5–15)
BUN: 19 mg/dL (ref 6–23)
CO2: 29 mEq/L (ref 19–32)
Calcium: 8.4 mg/dL (ref 8.4–10.5)
Chloride: 102 mEq/L (ref 96–112)
Creatinine, Ser: 1.31 mg/dL — ABNORMAL HIGH (ref 0.50–1.10)
GFR calc Af Amer: 44 mL/min — ABNORMAL LOW (ref >90)
GFR, EST NON AFRICAN AMERICAN: 38 mL/min — AB (ref >90)
Glucose, Bld: 100 mg/dL — ABNORMAL HIGH (ref 70–99)
Potassium: 3.4 mEq/L — ABNORMAL LOW (ref 3.7–5.3)
Sodium: 147 mEq/L (ref 137–147)

## 2014-08-04 LAB — PROTIME-INR
INR: 1.16 (ref 0.00–1.49)
Prothrombin Time: 15 seconds (ref 11.6–15.2)

## 2014-08-04 MED ORDER — POTASSIUM CHLORIDE CRYS ER 20 MEQ PO TBCR
40.0000 meq | EXTENDED_RELEASE_TABLET | Freq: Once | ORAL | Status: AC
  Administered 2014-08-04: 40 meq via ORAL
  Filled 2014-08-04: qty 2

## 2014-08-04 MED ORDER — PIPERACILLIN-TAZOBACTAM 3.375 G IVPB
3.3750 g | Freq: Three times a day (TID) | INTRAVENOUS | Status: DC
  Administered 2014-08-04 – 2014-08-05 (×3): 3.375 g via INTRAVENOUS
  Filled 2014-08-04 (×4): qty 50

## 2014-08-04 MED ORDER — WARFARIN SODIUM 4 MG PO TABS
4.0000 mg | ORAL_TABLET | Freq: Once | ORAL | Status: AC
Start: 2014-08-04 — End: 2014-08-04
  Administered 2014-08-04: 4 mg via ORAL
  Filled 2014-08-04: qty 1

## 2014-08-04 NOTE — Progress Notes (Signed)
Pt did not complete levaquinn infusion due to concern for side effects to tendons and tendonitis as an elderly.

## 2014-08-04 NOTE — Evaluation (Signed)
Physical Therapy Evaluation Patient Details Name: [name] MRN: [id number] DOB: 10/25/1933 Today's Date: 08/04/2014   History of Present Illness  80 y.o. female admitted with CHF exacerbation. Pt is s/p revision repair right patellla tendon 07/29/14 ; PMHx: chronic diarrhea, aortic stenosis, TKA, tachy-brady syndrome  Clinical Impression  **Pt admitted with CHF exacerbation, recent R patellar tendon repair 07/29/14**. Pt currently with functional limitations due to the deficits listed below (see PT Problem List).  Pt will benefit from skilled PT to increase their independence and safety with mobility to allow discharge to the venue listed below.   Pt walked 130' with RW and supervision. SaO2 98% on RA walking, HR 94 walking, 64 at rest. 2/4 dyspnea with walking. Overall she is doing well with mobility. She has 24* assist at home, no post acute PT indicated. Will follow during acute stay.  *    Follow Up Recommendations No PT follow up    Equipment Recommendations  None recommended by PT    Recommendations for Other Services       Precautions / Restrictions Precautions Precautions: Fall;Other (comment) Precaution Comments: NO FLEXION  R KNEE Required Braces or Orthoses: Knee Immobilizer - Right Knee Immobilizer - Right: On at all times Restrictions Weight Bearing Restrictions: No      Mobility  Bed Mobility Overal bed mobility: Modified Independent Bed Mobility: Supine to Sit     Supine to sit: Modified independent (Device/Increase time)        Transfers Overall transfer level: Needs assistance Equipment used: Rolling walker (2 wheeled) Transfers: Sit to/from Stand Sit to Stand: Supervision         General transfer comment: verbal cues for hand placement and LE management.  Ambulation/Gait Ambulation/Gait assistance: Supervision Ambulation Distance (Feet): 130 Feet Assistive device: Rolling walker (2 wheeled) Gait Pattern/deviations: Step-through  pattern     General Gait Details: cues for  posture and breathing  Stairs            Wheelchair Mobility    Modified Rankin (Stroke Patients Only)       Balance Overall balance assessment: Modified Independent                                           Pertinent Vitals/Pain Pain Assessment: No/denies pain    Home Living Family/patient expects to be discharged to:: Private residence Living Arrangements: Children Available Help at Discharge: Family;Available 24 hours/day Type of Home: House Home Access: Level entry     Home Layout: Multi-level;Able to live on main level with bedroom/bathroom Home Equipment: Wheelchair - manual;Cane - single point;Crutches;Cane - quad;Bedside commode;Shower seat;Walker - 2 wheels;Grab bars - tub/shower;Hand held shower head;Grab bars - toilet;Adaptive equipment Additional Comments: handicapped height commode    Prior Function Level of Independence: Independent with assistive device(s);Needs assistance         Comments: pt amb with walker prior to adm, w/c she has been using belongs to her husband, family assisted with sponge bath     Hand Dominance   Dominant Hand: Right    Extremity/Trunk Assessment   Upper Extremity Assessment: Overall WFL for tasks assessed           Lower Extremity Assessment: RLE deficits/detail RLE Deficits / Details: ankle WNL, able to assist with SLR    Cervical / Trunk Assessment: Normal  Communication   Communication: No difficulties  Cognition Arousal/Alertness: Awake/alert  Behavior During Therapy: WFL for tasks assessed/performed Overall Cognitive Status: Within Functional Limits for tasks assessed                      General Comments      Exercises General Exercises - Lower Extremity Ankle Circles/Pumps: 10 reps;AROM;Both;Supine Straight Leg Raises: AAROM;Right;10 reps;Supine      Assessment/Plan    PT Assessment Patient needs continued PT  services  PT Diagnosis Difficulty walking   PT Problem List Decreased strength;Decreased activity tolerance;Decreased mobility;Decreased safety awareness  PT Treatment Interventions DME instruction;Gait training;Functional mobility training;Therapeutic activities;Patient/family education   PT Goals (Current goals can be found in the Care Plan section) Acute Rehab PT Goals Patient Stated Goal: home tomorrow PT Goal Formulation: With patient/family Time For Goal Achievement: 08/11/14 Potential to Achieve Goals: Good    Frequency Min 5X/week   Barriers to discharge        Co-evaluation               End of Session Equipment Utilized During Treatment: Gait belt;Right knee immobilizer Activity Tolerance: Patient tolerated treatment well Patient left: with call bell/phone within reach;in chair;with family/visitor present Nurse Communication: Mobility status         Time: 7564-3329 PT Time Calculation (min) (ACUTE ONLY): 17 min   Charges:   PT Evaluation $Initial PT Evaluation Tier I: 1 Procedure PT Treatments $Gait Training: 8-22 mins   PT G Codes:          Philomena Doheny 08/04/2014, 10:22 AM  [phone]

## 2014-08-04 NOTE — Progress Notes (Addendum)
ANTIBIOTIC CONSULT NOTE - INITIAL  Pharmacy Consult for:  Zosyn Indication:  Pneumonia (HCAP)  Allergies  Allergen Reactions  . Codeine Nausea And Vomiting  . Hydrocodone Other (See Comments)    hallucination  . Morphine And Related Nausea And Vomiting  . Requip [Ropinirole Hcl]     Headache   . Zinc     nausea    Patient Measurements: Height: 5\' 1"  (154.9 cm) Weight: 155 lb 13.8 oz (70.7 kg) IBW/kg (Calculated) : 47.8  Vital Signs: Temp: 98.1 F (36.7 C) (11/29 0522) Temp Source: Oral (11/29 0522) BP: 134/59 mmHg (11/29 0522) Pulse Rate: 94 (11/29 1017) Intake/Output from previous day: 11/28 0701 - 11/29 0700 In: -  Out: 800 [Urine:800] Intake/Output from this shift: Total I/O In: 240 [P.O.:240] Out: 500 [Urine:500]  Labs:  Recent Labs  08/02/14 1116 08/03/14 0648 08/04/14 0553  WBC 9.6  --   --   HGB 11.3*  --   --   PLT 209  --   --   CREATININE 0.89 1.11* 1.31*   Estimated Creatinine Clearance: 31.8 mL/min (by C-G formula based on Cr of 1.31). No results for input(s): VANCOTROUGH, VANCOPEAK, VANCORANDOM, GENTTROUGH, GENTPEAK, GENTRANDOM, TOBRATROUGH, TOBRAPEAK, TOBRARND, AMIKACINPEAK, AMIKACINTROU, AMIKACIN in the last 72 hours.   Microbiology: Recent Results (from the past 720 hour(s))  Clostridium Difficile by PCR     Status: None   Collection Time: 07/10/14 12:33 PM  Result Value Ref Range Status   C difficile by pcr Not Detected Not Detected Final    Comment: This test is for use only with liquid or soft stools; performance characteristics of other clinical specimen types have not been established.   This assay was performed by Cepheid GeneXpert(R) PCR. The performance characteristics of this assay have been determined by [employer]. Performance characteristics refer to the analytical performance of the test.   Surgical pcr screen     Status: None   Collection Time: 07/22/14 11:22 AM  Result Value Ref Range Status   MRSA, PCR  NEGATIVE NEGATIVE Final   Staphylococcus aureus NEGATIVE NEGATIVE Final    Comment:        The Xpert SA Assay (FDA approved for NASAL specimens in patients over 54 years of age), is one component of a comprehensive surveillance program.  Test performance has been validated by [employer] for patients greater than or equal to 49 year old. It is not intended to diagnose infection nor to guide or monitor treatment.   Clostridium Difficile by PCR     Status: None   Collection Time: 08/03/14  2:23 PM  Result Value Ref Range Status   C difficile by pcr NEGATIVE NEGATIVE Final    Comment: Performed at [facility] History: Past Medical History  Diagnosis Date  . PAF (paroxysmal atrial fibrillation)     controlled with amiodarone, on coumadin  . CAD (coronary artery disease)   . Aortic root dilatation   . Moderate aortic stenosis   . Chronic anticoagulation   . Tachycardia-bradycardia syndrome     s/p PPM by Dr Doreatha Lew (MDT) 07/03/10  . GERD (gastroesophageal reflux disease)   . Pacemaker     2012  . Neuropathy     feet/legs  . Restless leg syndrome   . Headache(784.0)     hx migraines - takes Zoloft for migraines  . History of uterine cancer 1980s    treated with hysterectomy, external radiation and radiation seed implants.   [name]  Cancer     hx Kidney cancer / hx of endometrial cancer  . PONV (postoperative nausea and vomiting)   . Anxiety   . Heart murmur   . HTN (hypertension)   . Hyperlipemia   . Hypothyroidism   . Osteomyelitis as child  . Family history of anesthesia complication     daughter has ponv  . Syncope 05/28/2014  . Hepatic steatosis   . Elevated LFTs   . Gastroparesis   . Anal fissure   . Radiation proctitis   . Gastric polyp     adenomatous  . Neuromuscular disorder     neuropathy in right leg  . Chronic kidney disease     only has one kidney   . 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 atenolol  50 mg Oral  BID  . enoxaparin  40 mg Subcutaneous Q24H  . famotidine  20 mg Oral Daily  . levothyroxine  50 mcg Oral QAC breakfast  . losartan  100 mg Oral q morning - 10a  . potassium chloride SA  20 mEq Oral BID  . potassium chloride  40 mEq Oral Once  . pregabalin  75 mg Oral BID  . sertraline  50 mg Oral QPM  . warfarin  4 mg Oral ONCE-1800  . Warfarin - Pharmacist Dosing Inpatient   Does not apply q1800    Assessment:  Asked to assist with Zosyn therapy for this 80 year-old female with acute respiratory failure secondary to acute diastolic hear failure and pneumonia (HCAP)  Patient is status post right knee revision repair of a ruptured extensor mechanism on 07/29/14.  Acute renal insufficiency noted.  The creatinine result shows a rising trend with an estimated CrCl of 31.8 ml/min today. The standard Zosyn dose may be used while the CrCl is above 20 ml/min.  Goal of Therapy:   Eradication of infection  Antibiotic dosage appropriate for renal function  Plan:  Zosyn 3.375 grams IV every 8 hours, each dose infused over 4 hours.  [place]. 08/04/2014,12:42 PM

## 2014-08-04 NOTE — Progress Notes (Signed)
[place] for warfarin Indication: atrial fibrillation and VTE prophylaxis  Allergies  Allergen Reactions  . Codeine Nausea And Vomiting  . Hydrocodone Other (See Comments)    hallucination  . Morphine And Related Nausea And Vomiting  . Requip [Ropinirole Hcl]     Headache   . Zinc     nausea    Patient Measurements: Height: 5\' 1"  (154.9 cm) Weight: 155 lb 13.8 oz (70.7 kg) IBW/kg (Calculated) : 47.8  Vital Signs: Temp: 98.1 F (36.7 C) (11/29 0522) Temp Source: Oral (11/29 0522) BP: 134/59 mmHg (11/29 0522) Pulse Rate: 61 (11/29 0522)  Labs:  Recent Labs  08/02/14 1116 08/02/14 1745 08/03/14 0025 08/03/14 0648 08/04/14 0553  HGB 11.3*  --   --   --   --   HCT 34.7*  --   --   --   --   PLT 209  --   --   --   --   LABPROT 16.5*  --   --  15.7* 15.0  INR 1.32  --   --  1.24 1.16  CREATININE 0.89  --   --  1.11* 1.31*  TROPONINI <0.30 <0.30 <0.30 <0.30  --     Estimated Creatinine Clearance: 31.8 mL/min (by C-G formula based on Cr of 1.31).   Medical History: Past Medical History  Diagnosis Date  . PAF (paroxysmal atrial fibrillation)     controlled with amiodarone, on coumadin  . CAD (coronary artery disease)   . Aortic root dilatation   . Moderate aortic stenosis   . Chronic anticoagulation   . Tachycardia-bradycardia syndrome     s/p PPM by Dr Doreatha Lew (MDT) 07/03/10  . GERD (gastroesophageal reflux disease)   . Pacemaker     2012  . Neuropathy     feet/legs  . Restless leg syndrome   . Headache(784.0)     hx migraines - takes Zoloft for migraines  . History of uterine cancer 1980s    treated with hysterectomy, external radiation and radiation seed implants.   . Cancer     hx Kidney cancer / hx of endometrial cancer  . PONV (postoperative nausea and vomiting)   . Anxiety   . Heart murmur   . HTN (hypertension)   . Hyperlipemia   . Hypothyroidism   . Osteomyelitis as child  . Family history of  anesthesia complication     daughter has ponv  . Syncope 05/28/2014  . Hepatic steatosis   . Elevated LFTs   . Gastroparesis   . Anal fissure   . Radiation proctitis   . Gastric polyp     adenomatous  . Neuromuscular disorder     neuropathy in right leg  . Chronic kidney disease     only has one kidney   . 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 She was discharged 11/25 on enoxaparin 40mg  SQ daily and warfarin. She returns to ED 11/27 with chest pressure and dyspnea with BNP and CXR consistent with CHF exacerbation. Pt's warfar[facility] is [facility] at [facility] (not seen since discharge).  Pharmacy is now consulted to restart warfarin therapy. Enoxaparin 40mg  SQ q24h was resumed.   Home warfarin dose:  2mg  daily with last dose 11/26  Inpatient warfarin doses: 2mg  (11/27), 2mg  (11/28)  Today, 08/04/2014:  INR = 1.16 today (trending down)  CBC: Hgb = 11.3 and pltc WNL (11/27)  Diet: cardiac diet  Drug interactions: Levaquin may enhance the effects of warfarin (lower doses may be needed)  Goal of Therapy:  INR 2-3 Monitor platelets by anticoagulation protocol: Yes   Plan:   Warfarin 4mg  PO x 1 today  Daily INR  Stop enoxaparin when INR >= 2  Kizzie Furnish, PharmD Pager: [phone] 08/04/2014 7:39 AM

## 2014-08-04 NOTE — Progress Notes (Addendum)
[facility] PROGRESS NOTE  [name] CWC:[id number] DOB: March 13, 1934 DOA: 08/02/2014 PCP: [name], MD  Assessment/Plan:   acute respiratory failure secondary to Acute diastolic heart failure and pneumonia: Admitted to telemetry. Started on IV lasix, later on transitioned to po lasix,  replete electrolytes as needed.  Serial troponins negative and repeat EKG in am no ischemic changes. .  Echocardiogram in September 2015 showed good LVEF .  D dimer elevated and followed up with CT angio, was found to have pneumonia.   Atrial fibrillation Rate controlled. Resume atenolol at home dose.  Amiodarone held for elevated liver function panel. Resume coumadin as per pharmacy.   Recent knee surgery Pain control and physical therapy.  lovenox as per orthopedics.   Hypothyroidism Resume synthroid  Hypokalemia  Replete as needed.  Mild acute renal insufficiency: Probably from diuretics.  Holding IV LASIX today and resume po lasix from tomorrow.     Code Status: full code.  Family Communication: family at bedside Disposition Plan: tomorrow.   Consultants:  none  Procedures:  CT angio  Antibiotics:  none  HPI/Subjective:feeling much better.   Objective: Filed Vitals:   08/04/14 1017  BP:   Pulse: 94  Temp:   Resp:     Intake/Output Summary (Last 24 hours) at 08/04/14 1239 Last data filed at 08/04/14 0900  Gross per 24 hour  Intake    240 ml  Output   1300 ml  Net  -1060 ml   Filed Weights   08/02/14 1454 08/03/14 0508 08/04/14 0522  Weight: 73 kg (160 lb 15 oz) 72.4 kg (159 lb 9.8 oz) 70.7 kg (155 lb 13.8 oz)    Exam:   General:  Alert afebrile comfortable  Cardiovascular: s1s2  Respiratory: CTAB  Abdomen: soft non distended non tender bowel sounds heard  Musculoskeletal: no pedal edmea.   Data Reviewed: Basic Metabolic Panel:  Recent Labs Lab 07/30/14 0515 07/31/14 0456 08/02/14 1116 08/03/14 0648  08/04/14 0553  NA 139 140 140 144 147  K 3.9 4.2 3.6* 3.5* 3.4*  CL 103 104 103 103 102  CO2 24 22 20 28 29   GLUCOSE 141* 129* 113* 86 100*  BUN 15 21 19 18 19   CREATININE 1.01 1.35* 0.89 1.11* 1.31*  CALCIUM 8.6 8.5 8.6 8.5 8.4  MG  --   --   --  1.3*  --    Liver Function Tests: No results for input(s): AST, ALT, ALKPHOS, BILITOT, PROT, ALBUMIN in the last 168 hours. No results for input(s): LIPASE, AMYLASE in the last 168 hours. No results for input(s): AMMONIA in the last 168 hours. CBC:  Recent Labs Lab 07/29/14 1806 07/30/14 0515 07/31/14 0456 08/02/14 1116  WBC 4.5 6.4 11.0* 9.6  NEUTROABS  --   --   --  6.8  HGB 11.5* 10.7* 10.4* 11.3*  HCT 36.6 32.8* 31.7* 34.7*  MCV 93.8 91.9 92.4 93.5  PLT 151 177 181 209   Cardiac Enzymes:  Recent Labs Lab 08/02/14 1116 08/02/14 1745 08/03/14 0025 08/03/14 0648  TROPONINI <0.30 <0.30 <0.30 <0.30   BNP (last 3 results)  Recent Labs  08/02/14 1116  PROBNP 11208.0*   CBG: No results for input(s): GLUCAP in the last 168 hours.  Recent Results (from the past 240 hour(s))  Clostridium Difficile by PCR     Status: None   Collection Time: 08/03/14  2:23 PM  Result Value Ref Range Status   C difficile by pcr NEGATIVE NEGATIVE Final  Comment: Performed at [facility]     Studies: Ct Angio Chest Pe W/cm &/or Wo Cm  08/03/2014   CLINICAL DATA:  80 year old female with shortness of breath and history of recent knee surgery. Evaluate for pulmonary embolism.  EXAM: CT ANGIOGRAPHY CHEST WITH CONTRAST  TECHNIQUE: Multidetector CT imaging of the chest was performed using the standard protocol during bolus administration of intravenous contrast. Multiplanar CT image reconstructions and MIPs were obtained to evaluate the vascular anatomy.  CONTRAST:  71mL OMNIPAQUE IOHEXOL 350 MG/ML SOLN  COMPARISON:  Chest x-ray obtained yesterday; prior CT scan of the chest 02/10/2012  FINDINGS: Mediastinum: Streak artifact from the  right anterior chest cardiac rhythm maintenance device limits evaluation of the thoracic inlet. No suspicious mediastinal mass or adenopathy. The visualized esophagus is unremarkable. The thyroid gland is largely obscured by streak artifact.  Heart/Vascular: 2 vessel aortic arch. The right brachiocephalic and left common carotid artery share a common origin. Scattered atherosclerotic vascular calcifications are present. The ascending thoracic aorta is borderline aneurysmal at 3.9 cm in diameter measured at the level of the right main pulmonary artery. The aorta measured 3.7- 3.8 cm at the same location on 02/10/2012. The aortic arch and descending thoracic aorta are normal in caliber. Adequate opacification of pulmonary arteries to the proximal subsegmental level. No evidence of central filling defect to suggest acute pulmonary embolus. The heart is within normal limits for size. Atherosclerotic calcifications are present throughout the coronary arteries. No pericardial effusion. Right anterior chest subclavian approach cardiac rhythm maintenance device with leads terminating in the right atrium and right ventricle.  Lungs/Pleura: Moderate bilateral layering pleural effusions. No evidence of pleural nodularity or rind like enhancement. Negative for pulmonary edema. Mild dependent atelectasis in both lower lobes. Small focus of peribronchovascular ground-glass attenuation opacity within the central left upper lobe concerning for early infiltrate. Stable stellate pleural parenchymal scarring in the inferior most lingula.  Bones/Soft Tissues: No acute fracture or aggressive appearing lytic or blastic osseous lesion.  Upper Abdomen: Visualized upper abdominal organs are unremarkable.  Review of the MIP images confirms the above findings.  IMPRESSION: 1. Negative for acute pulmonary embolus. 2. Small focus of ground-glass attenuation infiltrates in a peribronchovascular distribution within the central left upper lobe  concerning for an early infectious/ inflammatory process such as bronchopneumonia. 3. Moderate bilateral layering pleural effusions with associated bibasilar atelectasis. 4. Borderline aneurysmal dilatation of the ascending thoracic aorta to a maximal diameter of 3.9 cm. This is slightly increased compared to 3.7-3.8 cm in June of 2013. Ectatic to mildly aneurysmal ascending thoracic aorta. Recommend annual imaging followup by CTA or MRA. This recommendation follows 2010 ACCF/AHA/AATS/ACR/ASA/SCA/SCAI/SIR/STS/SVM Guidelines for the Diagnosis and Management of Patients With Thoracic Aortic Disease. Circulation. 2010; 121: e266-e369 5. Atherosclerosis including multivessel coronary artery disease.   Electronically Signed   By: Jacqulynn Cadet M.D.   On: 08/03/2014 17:35    Scheduled Meds: . atenolol  50 mg Oral BID  . enoxaparin  40 mg Subcutaneous Q24H  . famotidine  20 mg Oral Daily  . levothyroxine  50 mcg Oral QAC breakfast  . losartan  100 mg Oral q morning - 10a  . potassium chloride SA  20 mEq Oral BID  . potassium chloride  40 mEq Oral Once  . pregabalin  75 mg Oral BID  . sertraline  50 mg Oral QPM  . warfarin  4 mg Oral ONCE-1800  . Warfarin - Pharmacist Dosing Inpatient   Does not apply [phone]  Continuous Infusions:   Active Problems:   CHF exacerbation   Acute exacerbation of CHF (congestive heart failure)    Time spent: 20 min    Duanne Duchesne  [facility] Pager [phone] If 7PM-7AM, please contact night-coverage at [email or web address], password [facility] 08/04/2014, 12:39 PM  LOS: 2 days

## 2014-08-05 ENCOUNTER — Ambulatory Visit: Payer: Medicare Other | Admitting: Gastroenterology

## 2014-08-05 LAB — BASIC METABOLIC PANEL
ANION GAP: 13 (ref 5–15)
BUN: 19 mg/dL (ref 6–23)
CHLORIDE: 100 meq/L (ref 96–112)
CO2: 26 meq/L (ref 19–32)
CREATININE: 1.32 mg/dL — AB (ref 0.50–1.10)
Calcium: 8.7 mg/dL (ref 8.4–10.5)
GFR calc Af Amer: 44 mL/min — ABNORMAL LOW (ref >90)
GFR calc non Af Amer: 38 mL/min — ABNORMAL LOW (ref >90)
Glucose, Bld: 100 mg/dL — ABNORMAL HIGH (ref 70–99)
Potassium: 3.7 mEq/L (ref 3.7–5.3)
Sodium: 139 mEq/L (ref 137–147)

## 2014-08-05 LAB — PROTIME-INR
INR: 1.21 (ref 0.00–1.49)
Prothrombin Time: 15.4 seconds — ABNORMAL HIGH (ref 11.6–15.2)

## 2014-08-05 MED ORDER — WARFARIN SODIUM 4 MG PO TABS
4.0000 mg | ORAL_TABLET | Freq: Once | ORAL | Status: DC
  Filled 2014-08-05: qty 1

## 2014-08-05 MED ORDER — AMOXICILLIN-POT CLAVULANATE 875-125 MG PO TABS: 1.0000 | ORAL_TABLET | Freq: Two times a day (BID) | ORAL | Status: DC

## 2014-08-05 MED ORDER — FUROSEMIDE 20 MG PO TABS: 20.0000 mg | ORAL_TABLET | Freq: Every day | ORAL | Status: DC

## 2014-08-05 NOTE — Progress Notes (Addendum)
[place] for warfarin Indication: atrial fibrillation and VTE prophylaxis  Allergies  Allergen Reactions  . Codeine Nausea And Vomiting  . Hydrocodone Other (See Comments)    hallucination  . Morphine And Related Nausea And Vomiting  . Requip [Ropinirole Hcl]     Headache   . Zinc     nausea    Patient Measurements: Height: 5\' 1"  (154.9 cm) Weight: 155 lb 10.3 oz (70.6 kg) IBW/kg (Calculated) : 47.8  Vital Signs: Temp: 97.9 F (36.6 C) (11/30 0442) Temp Source: Oral (11/30 0442) BP: 144/49 mmHg (11/30 0442) Pulse Rate: 63 (11/30 0442)  Labs:  Recent Labs  08/02/14 1116 08/02/14 1745 08/03/14 0025 08/03/14 0648 08/04/14 0553 08/05/14 0441  HGB 11.3*  --   --   --   --   --   HCT 34.7*  --   --   --   --   --   PLT 209  --   --   --   --   --   LABPROT 16.5*  --   --  15.7* 15.0 15.4*  INR 1.32  --   --  1.24 1.16 1.21  CREATININE 0.89  --   --  1.11* 1.31* 1.32*  TROPONINI <0.30 <0.30 <0.30 <0.30  --   --     Estimated Creatinine Clearance: 31.6 mL/min (by C-G formula based on Cr of 1.32).   Medical History: Past Medical History  Diagnosis Date  . PAF (paroxysmal atrial fibrillation)     controlled with amiodarone, on coumadin  . CAD (coronary artery disease)   . Aortic root dilatation   . Moderate aortic stenosis   . Chronic anticoagulation   . Tachycardia-bradycardia syndrome     s/p PPM by Dr Doreatha Lew (MDT) 07/03/10  . GERD (gastroesophageal reflux disease)   . Pacemaker     2012  . Neuropathy     feet/legs  . Restless leg syndrome   . Headache(784.0)     hx migraines - takes Zoloft for migraines  . History of uterine cancer 1980s    treated with hysterectomy, external radiation and radiation seed implants.   . Cancer     hx Kidney cancer / hx of endometrial cancer  . PONV (postoperative nausea and vomiting)   . Anxiety   . Heart murmur   . HTN (hypertension)   . Hyperlipemia   . Hypothyroidism   .  Osteomyelitis as child  . Family history of anesthesia complication     daughter has ponv  . Syncope 05/28/2014  . Hepatic steatosis   . Elevated LFTs   . Gastroparesis   . Anal fissure   . Radiation proctitis   . Gastric polyp     adenomatous  . Neuromuscular disorder     neuropathy in right leg  . Chronic kidney disease     only has one kidney   . 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 She was discharged 11/25 on enoxaparin 40mg  SQ daily and warfarin. She returns to ED 11/27 with chest pressure and dyspnea with BNP and CXR consistent with CHF exacerbation. Pt's warfarin is managed at [facility] (not seen since discharge).  Pharmacy is now consulted to restart warfarin therapy. Enoxaparin 40mg  SQ q24h was resumed.   Home warfarin dose:  2mg  daily with last dose 11/26  Inpatient warfarin doses: 2mg  (11/27), 2mg  (11/28), 4 mg (  11/29)  Today, 08/05/2014:  INR = 1.21 today, trending up  CBC: Hgb = 11.3 and pltc WNL (11/27)  Diet: cardiac diet, 100% of meals eaten  Drug interactions: Sertraline, Levothyroxine (on PTA); Levofloxacin d/c'ed 11/29   Goal of Therapy:  INR 2-3 Monitor platelets by anticoagulation protocol: Yes   Plan:   Repeat Warfarin 4mg  PO x 1 today  Daily PT/INR  CBC in AM  D/C enoxaparin when INR >= 2   Lindell Spar, PharmD, BCPS Pager: [phone] 08/05/2014 7:46 AM

## 2014-08-05 NOTE — Progress Notes (Signed)
Physical Therapy Treatment Patient Details Name: [name] MRN: [id number] DOB: 1934/07/03 Today's Date: 08/05/2014    History of Present Illness 80 y.o. female admitted with CHF exacerbation, PNA.  Pt is s/p revision repair right patellla tendon 07/29/14 ; PMHx: chronic diarrhea, aortic stenosis, TKA, tachy-brady syndrome    PT Comments    *Pt doing well with mobility. She walked 140' with RW and supervision. SaO2 98% on RA walking, HR 94 with walking, Mild 1/4 dyspnea with walking. **  Follow Up Recommendations  No PT follow up     Equipment Recommendations  None recommended by PT    Recommendations for Other Services       Precautions / Restrictions Precautions Precautions: Fall;Other (comment) Precaution Comments: NO FLEXION  R KNEE Required Braces or Orthoses: Knee Immobilizer - Right Knee Immobilizer - Right: On at all times Restrictions Weight Bearing Restrictions: No    Mobility  Bed Mobility Overal bed mobility: Modified Independent Bed Mobility: Supine to Sit     Supine to sit: Modified independent (Device/Increase time)        Transfers Overall transfer level: Needs assistance Equipment used: Rolling walker (2 wheeled) Transfers: Sit to/from Stand Sit to Stand: Supervision         General transfer comment: verbal cues for hand placement and LE management.  Ambulation/Gait Ambulation/Gait assistance: Supervision Ambulation Distance (Feet): 140 Feet Assistive device: Rolling walker (2 wheeled)       General Gait Details: cues for  posture and breathing, 1/4 dyspnea with walking, SaO2 98% RA with walking, HR 94 walking   Stairs            Wheelchair Mobility    Modified Rankin (Stroke Patients Only)       Balance Overall balance assessment: Modified Independent                                  Cognition Arousal/Alertness: Awake/alert Behavior During Therapy: WFL for tasks assessed/performed Overall  Cognitive Status: Within Functional Limits for tasks assessed                      Exercises General Exercises - Lower Extremity Ankle Circles/Pumps: 10 reps;AROM;Both;Supine Straight Leg Raises: AAROM;Right;10 reps;Supine    General Comments        Pertinent Vitals/Pain Pain Assessment: No/denies pain    Home Living                      Prior Function            PT Goals (current goals can now be found in the care plan section) Acute Rehab PT Goals Patient Stated Goal: home  PT Goal Formulation: With patient/family Time For Goal Achievement: 08/11/14 Potential to Achieve Goals: Good Progress towards PT goals: Progressing toward goals    Frequency  Min 5X/week    PT Plan Current plan remains appropriate    Co-evaluation             End of Session Equipment Utilized During Treatment: Gait belt;Right knee immobilizer Activity Tolerance: Patient tolerated treatment well Patient left: with call bell/phone within reach;in chair;with family/visitor present     Time: 9371-6967 PT Time Calculation (min) (ACUTE ONLY): 15 min  Charges:  $Gait Training: 8-22 mins                    G Codes:  Blondell Reveal Kistler 08/05/2014, 10:51 AM  [phone]

## 2014-08-05 NOTE — Discharge Summary (Signed)
Physician Discharge Summary  Patient ID: [name] MRN: [id number] DOB/AGE: 02-17-1934 80 y.o.  Admit date: 07/29/2014 Discharge date: 07/31/2014   Procedures:  Procedure(s) (LRB): RIGHT KNEE REVISION OF PREVIOUS REPAIR EXTENSOR MECHANISM (Right)  Attending Physician:  Dr. Paralee Cancel   Admission Diagnoses:   Right knee failed extensor mechanism   Discharge Diagnoses:  Active Problems:   Rupture patellar tendon  Past Medical History  Diagnosis Date  . PAF (paroxysmal atrial fibrillation)     controlled with amiodarone, on coumadin  . CAD (coronary artery disease)   . Aortic root dilatation   . Moderate aortic stenosis   . Chronic anticoagulation   . Tachycardia-bradycardia syndrome     s/p PPM by Dr Doreatha Lew (MDT) 07/03/10  . GERD (gastroesophageal reflux disease)   . Pacemaker     2012  . Neuropathy     feet/legs  . Restless leg syndrome   . Headache(784.0)     hx migraines - takes Zoloft for migraines  . History of uterine cancer 1980s    treated with hysterectomy, external radiation and radiation seed implants.   . Cancer     hx Kidney cancer / hx of endometrial cancer  . PONV (postoperative nausea and vomiting)   . Anxiety   . Heart murmur   . HTN (hypertension)   . Hyperlipemia   . Hypothyroidism   . Osteomyelitis as child  . Family history of anesthesia complication     daughter has ponv  . Syncope 05/28/2014  . Hepatic steatosis   . Elevated LFTs   . Gastroparesis   . Anal fissure   . Radiation proctitis   . Gastric polyp     adenomatous  . Neuromuscular disorder     neuropathy in right leg  . Chronic kidney disease     only has one kidney   . 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 female complaining of right knee pain and the inability to straighten the right knee. She had a previous repair of the her patella tendon after it avulsed off of the tibial tubercle. She had this repaired on 03/06/2014 per Dr. Alvan Dame. She had  originally done well after surgery but recently complaining of the inability to extend her knee. Pain had continually increased since the beginning. X-rays in the clinic show previous TKA. Pt has tried various conservative treatments which have failed to alleviate their symptoms. Various options are discussed with the patient. Risks, benefits and expectations were discussed with the patient. Risks including but not limited to the risk of anesthesia, blood clots, nerve damage, blood vessel damage, failure of the prosthesis/repair, possibly losing flexion of the knee, infection and up to and including death. Patient understand the risks, benefits and expectations and wishes to proceed with surgery.   PCP: [name], MD   Discharged Condition: good  Hospital Course:  Patient underwent the above stated procedure on 07/29/2014. Patient tolerated the procedure well and brought to the recovery room in good condition and subsequently to the floor.  POD #1 BP: 140/65 ; Pulse: 74 ; Temp: 98 F (36.7 C) ; Resp: 16 Patient reports pain as mild. No events overnight, rested ok. Neurovascular intact and incision: dressing C/D/I.  LABS  Basename    HGB  10.7  HCT  32.8   POD #2  BP: 141/64 ; Pulse: 81 ; Temp: 97.9 F (36.6 C) ; Resp: 17 Patient reports pain as mild, pain controlled. No events  throughout the night. Remembers having issues with the dressing last time, so we took it off this morning. Feels that she is doing well this morning.  Ready to discharge home.  LABS  Basename    HGB  10.4  HCT  31.7    Discharge Exam: General appearance: alert, cooperative and no distress Extremities: Homans sign is negative, no sign of DVT, no edema, redness or tenderness in the calves or thighs and no ulcers, gangrene or trophic changes  Disposition: Home with follow up in 2 weeks   Follow-up Information    Follow up with [facility].   Why:  home health physical  therapy and nurse for INR blood draws   Contact information:   [address] [place] [address] [phone]       Follow up with Mauri Pole, MD. Schedule an appointment as soon as possible for a visit in 2 weeks.   Specialty:  Orthopedic Surgery   Contact information:   [address] [place] [address] [phone]       Discharge Instructions    Call MD / Call 911    Complete by:  As directed   If you experience chest pain or shortness of breath, CALL 911 and be transported to the hospital emergency room.  If you develope a fever above 101 F, pus (white drainage) or increased drainage or redness at the wound, or calf pain, call your surgeon's office.     Constipation Prevention    Complete by:  As directed   Drink plenty of fluids.  Prune juice may be helpful.  You may use a stool softener, such as Colace (over the counter) 100 mg twice a day.  Use MiraLax (over the counter) for constipation as needed.     Diet - low sodium heart healthy    Complete by:  As directed      Discharge instructions    Complete by:  As directed   Change dressing daily with 4x4 gauze and tape. Keep the area clean and dry.  Maintain a straight knee in either knee immobilizer or Bledsoe brace locked in extension.  Follow up in 2 weeks at [facility]. Call with any questions or concerns.     Driving restrictions    Complete by:  As directed   No driving for 4 weeks     Weight bearing as tolerated    Complete by:  As directed   Laterality:  right  Extremity:  Lower             Medication List    STOP taking these medications        cholestyramine 4 G packet  Commonly known as:  QUESTRAN     Magnesium 250 MG Tabs     warfarin 2 MG tablet  Commonly known as:  COUMADIN      TAKE these medications        acetaminophen 325 MG tablet  Commonly known as:  TYLENOL  Take 1-2 tablets (325-650 mg total) by mouth every 6 (six) hours as needed for  moderate pain.     ALPRAZolam 0.5 MG tablet  Commonly known as:  XANAX  Take 0.25 mg by mouth 3 (three) times daily as needed for anxiety.     atenolol 100 MG tablet  Commonly known as:  TENORMIN  Take 50 mg by mouth 2 (two) times daily.     Biotin 5000 MCG Caps  Take 10,000 mcg  by mouth every morning.     enoxaparin 40 MG/0.4ML injection  Commonly known as:  LOVENOX  Inject 0.4 mLs (40 mg total) into the skin daily.     ferrous sulfate 325 (65 FE) MG tablet  Take 1 tablet (325 mg total) by mouth 3 (three) times daily after meals.     furosemide 40 MG tablet  Commonly known as:  LASIX  Take 1 tablet (40 mg total) by mouth daily. Take AD     IMODIUM A-D PO  Take 0.5-1 tablets by mouth every morning. Patient takes 1 tablet in the am and .5 tablet in the pm     levothyroxine 100 MCG tablet  Commonly known as:  SYNTHROID, LEVOTHROID  Take 50 mcg by mouth daily before breakfast.     losartan 100 MG tablet  Commonly known as:  COZAAR  Take 1 tablet (100 mg total) by mouth daily.     meclizine 25 MG tablet  Commonly known as:  ANTIVERT  Take 25 mg by mouth as needed for dizziness or nausea.     methocarbamol 500 MG tablet  Commonly known as:  ROBAXIN  Take 1 tablet (500 mg total) by mouth every 6 (six) hours as needed for muscle spasms.     nitroGLYCERIN 0.4 MG SL tablet  Commonly known as:  NITROSTAT  Place 0.4 mg under the tongue every 5 (five) minutes as needed for chest pain.     nitroGLYCERIN 0.4 mg/hr patch  Commonly known as:  NITRODUR - Dosed in mg/24 hr  Place 0.4 mg onto the skin daily as needed (chest pain).     potassium chloride SA 20 MEQ tablet  Commonly known as:  K-DUR,KLOR-CON  Take 20 mEq by mouth 2 (two) times daily.     pregabalin 75 MG capsule  Commonly known as:  LYRICA  Take 75 mg by mouth 2 (two) times daily.     ranitidine 150 MG tablet  Commonly known as:  ZANTAC  Take 150 mg by mouth 2 (two) times daily.     sertraline 50 MG tablet    Commonly known as:  ZOLOFT  Take 50 mg by mouth every evening.     traMADol 50 MG tablet  Commonly known as:  ULTRAM  Take 1-2 tablets (50-100 mg total) by mouth every 6 (six) hours as needed.     VITAMIN D PO  Take 5,000 Units by mouth every morning.         Signed: West Pugh. Hondo Nanda   PA-C  08/05/2014, 12:40 PM

## 2014-08-05 NOTE — Progress Notes (Signed)
Discharge instructions gone over with pt. And family.  All questions answered.  VSS.  IV and tele removed.  Prescriptions given to pt family member.  Wheeled out by NT.

## 2014-08-07 ENCOUNTER — Ambulatory Visit (INDEPENDENT_AMBULATORY_CARE_PROVIDER_SITE_OTHER): Payer: Medicare Other | Admitting: Interventional Cardiology

## 2014-08-07 DIAGNOSIS — I480 Paroxysmal atrial fibrillation: Secondary | ICD-10-CM

## 2014-08-07 LAB — POCT INR: INR: 1.3

## 2014-08-08 ENCOUNTER — Encounter: Payer: Self-pay | Admitting: Internal Medicine

## 2014-08-08 ENCOUNTER — Ambulatory Visit (INDEPENDENT_AMBULATORY_CARE_PROVIDER_SITE_OTHER): Payer: Medicare Other | Admitting: *Deleted

## 2014-08-08 DIAGNOSIS — I480 Paroxysmal atrial fibrillation: Secondary | ICD-10-CM

## 2014-08-08 DIAGNOSIS — I495 Sick sinus syndrome: Secondary | ICD-10-CM

## 2014-08-08 LAB — MDC_IDC_ENUM_SESS_TYPE_REMOTE
Brady Statistic AP VS Percent: 94 %
Brady Statistic AS VS Percent: 6 %
Date Time Interrogation Session: 20151203152443
Lead Channel Impedance Value: 413 Ohm
Lead Channel Pacing Threshold Amplitude: 1 V
Lead Channel Pacing Threshold Pulse Width: 0.4 ms
Lead Channel Pacing Threshold Pulse Width: 0.4 ms
Lead Channel Setting Sensing Sensitivity: 2.8 mV
MDC IDC MSMT BATTERY IMPEDANCE: 292 Ohm
MDC IDC MSMT BATTERY REMAINING LONGEVITY: 106 mo
MDC IDC MSMT BATTERY VOLTAGE: 2.8 V
MDC IDC MSMT LEADCHNL RA IMPEDANCE VALUE: 459 Ohm
MDC IDC MSMT LEADCHNL RA PACING THRESHOLD AMPLITUDE: 0.375 V
MDC IDC MSMT LEADCHNL RV SENSING INTR AMPL: 5.6 mV
MDC IDC SET LEADCHNL RA PACING AMPLITUDE: 2 V
MDC IDC SET LEADCHNL RV PACING AMPLITUDE: 2.5 V
MDC IDC SET LEADCHNL RV PACING PULSEWIDTH: 0.4 ms
MDC IDC STAT BRADY AP VP PERCENT: 0 %
MDC IDC STAT BRADY AS VP PERCENT: 0 %

## 2014-08-09 ENCOUNTER — Ambulatory Visit (INDEPENDENT_AMBULATORY_CARE_PROVIDER_SITE_OTHER): Payer: Medicare Other | Admitting: Cardiovascular Disease

## 2014-08-09 DIAGNOSIS — I480 Paroxysmal atrial fibrillation: Secondary | ICD-10-CM

## 2014-08-09 LAB — POCT INR: INR: 1.5

## 2014-08-09 NOTE — Progress Notes (Signed)
Remote pacemaker transmission.   

## 2014-08-12 ENCOUNTER — Telehealth: Payer: Self-pay

## 2014-08-12 ENCOUNTER — Ambulatory Visit (INDEPENDENT_AMBULATORY_CARE_PROVIDER_SITE_OTHER): Payer: Medicare Other | Admitting: Pharmacist

## 2014-08-12 DIAGNOSIS — I480 Paroxysmal atrial fibrillation: Secondary | ICD-10-CM

## 2014-08-12 LAB — POCT INR: INR: 1.6

## 2014-08-12 NOTE — Discharge Summary (Signed)
Physician Discharge Summary  [name] GUY:[id number] DOB: 01/19/1934 DOA: 08/02/2014  PCP: [name], MD  Admit date: 08/02/2014 Discharge date: 08/05/2014  Time spent:30 minutes  Recommendations for Outpatient Follow-up:  1. follo wup with PCP in 2 weeks.  2. Please check BMP in one week.   Discharge Diagnoses:  Active Problems:   CHF exacerbation   Acute exacerbation of CHF (congestive heart failure) pneumonia.   Discharge Condition: improved.   Diet recommendation: low sodiumd iet.   Filed Weights   08/03/14 0508 08/04/14 0522 08/05/14 0442  Weight: 72.4 kg (159 lb 9.8 oz) 70.7 kg (155 lb 13.8 oz) 70.6 kg (155 lb 10.3 oz)    History of present illness:  [name] is a 80 y.o. female recently discharged from the hospital on Wednesday after undergoing repair of the Knee tendons comes back with sudden sob with chest tightness started last night associated with palpitations. She denies chest pain. She denies fevers or chills. On arrival to ED, her pro bnp was elevated at 11,208 and labs revealed, mild anemia and hypokalemia. She was referred to medical service for admission foR CHF. Her CXR revealed mild CHF.   Hospital Course:  acute respiratory failure secondary to Acute diastolic heart failure and pneumonia: Admitted to telemetry. Started on IV lasix, later on transitioned to po lasix, replete electrolytes as needed.  Serial troponins negative and repeat EKG in am no ischemic changes. .  Echocardiogram in September 2015 showed good LVEF .  D dimer elevated and followed up with CT angio, was found to have pneumonia, negative for PE. [name]   Atrial fibrillation Rate controlled. Resume atenolol at home dose.  Amiodarone held for elevated liver function panel. Resume coumadin as per pharmacy.   Recent knee surgery Pain control and physical therapy.  lovenox as per orthopedics.   Hypothyroidism Resume synthroid  Hypokalemia  Replete as  needed.  Mild acute renal insufficiency: Probably from diuretics.  Holding IV LASIX  And recommend starting it when renal function improves.  Procedures:  none  Consultations: Orthopedics.  Discharge Exam: Filed Vitals:   08/05/14 1300  BP: 129/45  Pulse: 63  Temp:   Resp:     General: alert afebrile comfortable Cardiovascular: s11s2 Respiratory: ctab  Discharge Instructions You were cared for by a hospitalist during your hospital stay. If you have any questions about your discharge medications or the care you received while you were in the hospital after you are discharged, you can call the unit and asked to speak with the hospitalist on call if the hospitalist that took care of you is not available. Once you are discharged, your primary care physician will handle any further medical issues. Please note that NO REFILLS for any discharge medications will be authorized once you are discharged, as it is imperative that you return to your primary care physician (or establish a relationship with a primary care physician if you do not have one) for your aftercare needs so that they can reassess your need for medications and monitor your lab values.  Discharge Instructions    Diet - low sodium heart healthy    Complete by:  As directed      Discharge instructions    Complete by:  As directed   Follow up with PCP in one week Please obtain BMP( basic metabolic panel) in 2 days to check renal function and if your renal function is back to baseline, please resume lasix at your home dose.  Discharge Medication List as of 08/05/2014  1:56 PM    START taking these medications   Details  amoxicillin-clavulanate (AUGMENTIN) 875-125 MG per tablet Take 1 tablet by mouth 2 (two) times daily., Starting 08/05/2014, Until Discontinued, Print      CONTINUE these medications which have NOT CHANGED   Details  acetaminophen (TYLENOL) 325 MG tablet Take 1-2 tablets (325-650 mg total) by  mouth every 6 (six) hours as needed for moderate pain., Starting 07/31/2014, Until Discontinued, No Print    ALPRAZolam (XANAX) 0.5 MG tablet Take 0.25 mg by mouth 3 (three) times daily as needed for anxiety. , Until Discontinued, Historical Med    atenolol (TENORMIN) 100 MG tablet Take 50 mg by mouth 2 (two) times daily. , Until Discontinued, Historical Med    Biotin 5000 MCG CAPS Take 10,000 mcg by mouth every morning. , Until Discontinued, Historical Med    Cholecalciferol (VITAMIN D PO) Take 5,000 Units by mouth every morning. , Until Discontinued, Historical Med    enoxaparin (LOVENOX) 40 MG/0.4ML injection Inject 0.4 mLs (40 mg total) into the skin daily., Starting 07/31/2014, Until Discontinued, Print    ferrous sulfate 325 (65 FE) MG tablet Take 1 tablet (325 mg total) by mouth 3 (three) times daily after meals., Starting 07/31/2014, Until Discontinued, No Print    levothyroxine (SYNTHROID, LEVOTHROID) 100 MCG tablet Take 50 mcg by mouth daily before breakfast. , Until Discontinued, Historical Med    Loperamide HCl (IMODIUM A-D PO) Take 0.5-1 tablets by mouth every morning. Patient takes 1 tablet in the am and .5 tablet in the pm, Until Discontinued, Historical Med    meclizine (ANTIVERT) 25 MG tablet Take 25 mg by mouth as needed for dizziness or nausea., Starting 02/26/2014, Until Wed 02/26/15, Historical Med    methocarbamol (ROBAXIN) 500 MG tablet Take 1 tablet (500 mg total) by mouth every 6 (six) hours as needed for muscle spasms., Starting 07/31/2014, Until Discontinued, Print    nitroGLYCERIN (NITROSTAT) 0.4 MG SL tablet Place 0.4 mg under the tongue every 5 (five) minutes as needed for chest pain., Until Discontinued, Historical Med    potassium chloride SA (K-DUR,KLOR-CON) 20 MEQ tablet Take 20 mEq by mouth 2 (two) times daily., Until Discontinued, Historical Med    pregabalin (LYRICA) 75 MG capsule Take 75 mg by mouth 2 (two) times daily., Until Discontinued, Historical Med     ranitidine (ZANTAC) 150 MG tablet Take 150 mg by mouth 2 (two) times daily. , Until Discontinued, Historical Med    sertraline (ZOLOFT) 50 MG tablet Take 50 mg by mouth every evening. , Until Discontinued, Historical Med    traMADol (ULTRAM) 50 MG tablet Take 1-2 tablets (50-100 mg total) by mouth every 6 (six) hours as needed., Starting 07/31/2014, Until Discontinued, Print    warfarin (COUMADIN) 2 MG tablet Take 2 mg by mouth daily., Until Discontinued, Historical Med      STOP taking these medications     furosemide (LASIX) 40 MG tablet      losartan (COZAAR) 100 MG tablet      nitroGLYCERIN (NITRODUR - DOSED IN MG/24 HR) 0.4 mg/hr patch        Allergies  Allergen Reactions  . Codeine Nausea And Vomiting  . Hydrocodone Other (See Comments)    hallucination  . Morphine And Related Nausea And Vomiting  . Requip [Ropinirole Hcl]     Headache   . Zinc     nausea   Follow-up Information    Follow up  with MCKEOWN,WILLIAM DAVID, MD. Schedule an appointment as soon as possible for a visit in 1 week.   Specialty:  Internal Medicine   Contact information:   [address] [place] [address] [phone]        The results of significant diagnostics from this hospitalization (including imaging, microbiology, ancillary and laboratory) are listed below for reference.    Significant Diagnostic Studies: Ct Angio Chest Pe W/cm &/or Wo Cm  08/03/2014   CLINICAL DATA:  80 year old female with shortness of breath and history of recent knee surgery. Evaluate for pulmonary embolism.  EXAM: CT ANGIOGRAPHY CHEST WITH CONTRAST  TECHNIQUE: Multidetector CT imaging of the chest was performed using the standard protocol during bolus administration of intravenous contrast. Multiplanar CT image reconstructions and MIPs were obtained to evaluate the vascular anatomy.  CONTRAST:  82mL OMNIPAQUE IOHEXOL 350 MG/ML SOLN  COMPARISON:  Chest x-ray obtained yesterday; prior CT  scan of the chest 02/10/2012  FINDINGS: Mediastinum: Streak artifact from the right anterior chest cardiac rhythm maintenance device limits evaluation of the thoracic inlet. No suspicious mediastinal mass or adenopathy. The visualized esophagus is unremarkable. The thyroid gland is largely obscured by streak artifact.  Heart/Vascular: 2 vessel aortic arch. The right brachiocephalic and left common carotid artery share a common origin. Scattered atherosclerotic vascular calcifications are present. The ascending thoracic aorta is borderline aneurysmal at 3.9 cm in diameter measured at the level of the right main pulmonary artery. The aorta measured 3.7- 3.8 cm at the same location on 02/10/2012. The aortic arch and descending thoracic aorta are normal in caliber. Adequate opacification of pulmonary arteries to the proximal subsegmental level. No evidence of central filling defect to suggest acute pulmonary embolus. The heart is within normal limits for size. Atherosclerotic calcifications are present throughout the coronary arteries. No pericardial effusion. Right anterior chest subclavian approach cardiac rhythm maintenance device with leads terminating in the right atrium and right ventricle.  Lungs/Pleura: Moderate bilateral layering pleural effusions. No evidence of pleural nodularity or rind like enhancement. Negative for pulmonary edema. Mild dependent atelectasis in both lower lobes. Small focus of peribronchovascular ground-glass attenuation opacity within the central left upper lobe concerning for early infiltrate. Stable stellate pleural parenchymal scarring in the inferior most lingula.  Bones/Soft Tissues: No acute fracture or aggressive appearing lytic or blastic osseous lesion.  Upper Abdomen: Visualized upper abdominal organs are unremarkable.  Review of the MIP images confirms the above findings.  IMPRESSION: 1. Negative for acute pulmonary embolus. 2. Small focus of ground-glass attenuation  infiltrates in a peribronchovascular distribution within the central left upper lobe concerning for an early infectious/ inflammatory process such as bronchopneumonia. 3. Moderate bilateral layering pleural effusions with associated bibasilar atelectasis. 4. Borderline aneurysmal dilatation of the ascending thoracic aorta to a maximal diameter of 3.9 cm. This is slightly increased compared to 3.7-3.8 cm in June of 2013. Ectatic to mildly aneurysmal ascending thoracic aorta. Recommend annual imaging followup by CTA or MRA. This recommendation follows 2010 ACCF/AHA/AATS/ACR/ASA/SCA/SCAI/SIR/STS/SVM Guidelines for the Diagnosis and Management of Patients With Thoracic Aortic Disease. Circulation. 2010; 121: e266-e369 5. Atherosclerosis including multivessel coronary artery disease.   Electronically Signed   By: Jacqulynn Cadet M.D.   On: 08/03/2014 17:35   Dg Chest Port 1 View  08/02/2014   CLINICAL DATA:  Sternal chest pain and shortness of breath since last night.  EXAM: PORTABLE CHEST - 1 VIEW  COMPARISON:  Previous examinations, the most recent dated 05/28/2014.  FINDINGS: Interval borderline enlarged cardiac  silhouette. Stable right subclavian pacemaker leads. Increased prominence of the pulmonary vasculature and interstitial markings. Mild bibasilar airspace opacity. Probable small left pleural effusion. Diffuse osteopenia.  IMPRESSION: 1. Interval borderline cardiomegaly and mild changes of acute congestive heart failure. 2. Mild bibasilar airspace opacity, most likely representing atelectasis.   Electronically Signed   By: Enrique Sack M.D.   On: 08/02/2014 11:45    Microbiology: Recent Results (from the past 240 hour(s))  Clostridium Difficile by PCR     Status: None   Collection Time: 08/03/14  2:23 PM  Result Value Ref Range Status   C difficile by pcr NEGATIVE NEGATIVE Final    Comment: Performed at [facility]: Basic Metabolic Panel: No results for input(s): NA, K, CL,  CO2, GLUCOSE, BUN, CREATININE, CALCIUM, MG, PHOS in the last 168 hours. Liver Function Tests: No results for input(s): AST, ALT, ALKPHOS, BILITOT, PROT, ALBUMIN in the last 168 hours. No results for input(s): LIPASE, AMYLASE in the last 168 hours. No results for input(s): AMMONIA in the last 168 hours. CBC: No results for input(s): WBC, NEUTROABS, HGB, HCT, MCV, PLT in the last 168 hours. Cardiac Enzymes: No results for input(s): CKTOTAL, CKMB, CKMBINDEX, TROPONINI in the last 168 hours. BNP: BNP (last 3 results)  Recent Labs  08/02/14 1116  PROBNP 11208.0*   CBG: No results for input(s): GLUCAP in the last 168 hours.     SignedHosie Poisson  [facility] 08/12/2014, 7:23 AM

## 2014-08-12 NOTE — Telephone Encounter (Signed)
Patients daughter, Levander Campion called. Patient just got out of hospital and is asking what dose of lasix she should be taking. Per Dr.McKeown, patient should be taking 40mg  daily. Patients daughter aware.

## 2014-08-14 ENCOUNTER — Ambulatory Visit (INDEPENDENT_AMBULATORY_CARE_PROVIDER_SITE_OTHER): Payer: Medicare Other | Admitting: Cardiology

## 2014-08-14 DIAGNOSIS — I480 Paroxysmal atrial fibrillation: Secondary | ICD-10-CM

## 2014-08-14 LAB — POCT INR: INR: 2.2

## 2014-08-19 ENCOUNTER — Ambulatory Visit (INDEPENDENT_AMBULATORY_CARE_PROVIDER_SITE_OTHER): Payer: Medicare Other | Admitting: Cardiology

## 2014-08-19 DIAGNOSIS — I480 Paroxysmal atrial fibrillation: Secondary | ICD-10-CM

## 2014-08-19 LAB — POCT INR: INR: 2.1

## 2014-08-19 NOTE — Telephone Encounter (Signed)
Received INR from nurse, see Anti-coag encounter.

## 2014-08-19 NOTE — Telephone Encounter (Signed)
New Prob   [facility] nurse calling to report PT-INR results. Please call.

## 2014-08-26 ENCOUNTER — Ambulatory Visit (INDEPENDENT_AMBULATORY_CARE_PROVIDER_SITE_OTHER): Payer: Medicare Other | Admitting: Pharmacist

## 2014-08-26 DIAGNOSIS — I480 Paroxysmal atrial fibrillation: Secondary | ICD-10-CM

## 2014-08-26 LAB — POCT INR: INR: 1.9

## 2014-08-27 ENCOUNTER — Telehealth: Payer: Self-pay | Admitting: *Deleted

## 2014-08-27 DIAGNOSIS — I495 Sick sinus syndrome: Secondary | ICD-10-CM

## 2014-08-27 MED ORDER — ATENOLOL 25 MG PO TABS: 25.0000 mg | ORAL_TABLET | Freq: Two times a day (BID) | ORAL | Status: DC

## 2014-08-27 NOTE — Telephone Encounter (Signed)
Left message for [facility] ) for Ms Lick to increase her Atenolol from 50mg  bid -> 75 mg bid per Dr. Rayann Heman.

## 2014-08-27 NOTE — Telephone Encounter (Signed)
Spoke with Beverlee Nims Page(daughter) and reviewed medicine change with her.  Rx faxed to [facility] in [place].

## 2014-08-28 ENCOUNTER — Telehealth: Payer: Self-pay | Admitting: *Deleted

## 2014-08-28 ENCOUNTER — Other Ambulatory Visit: Payer: Self-pay

## 2014-08-28 ENCOUNTER — Other Ambulatory Visit: Payer: Self-pay | Admitting: Internal Medicine

## 2014-08-28 ENCOUNTER — Other Ambulatory Visit: Payer: Self-pay | Admitting: *Deleted

## 2014-08-28 DIAGNOSIS — G459 Transient cerebral ischemic attack, unspecified: Secondary | ICD-10-CM

## 2014-08-28 MED ORDER — ATENOLOL 50 MG PO TABS
ORAL_TABLET | ORAL | Status: DC
Start: 2014-08-28 — End: 2015-01-14

## 2014-08-28 NOTE — Telephone Encounter (Signed)
Daughter called and states Dr Rayann Heman increased patient's Atenolol to 75 mg BID due to reading from her pacemaker.  OK per Dr Melford Aase.  Also, Dr Melford Aase reviewed reports from patient's eye doctor and suggest patient have a carotid ultrasound  DX-TIA.  Daughter aware.

## 2014-08-31 ENCOUNTER — Encounter: Payer: Self-pay | Admitting: *Deleted

## 2014-09-09 ENCOUNTER — Ambulatory Visit (INDEPENDENT_AMBULATORY_CARE_PROVIDER_SITE_OTHER): Payer: Self-pay | Admitting: Cardiology

## 2014-09-09 DIAGNOSIS — Z4889 Encounter for other specified surgical aftercare: Secondary | ICD-10-CM | POA: Diagnosis not present

## 2014-09-09 DIAGNOSIS — I129 Hypertensive chronic kidney disease with stage 1 through stage 4 chronic kidney disease, or unspecified chronic kidney disease: Secondary | ICD-10-CM | POA: Diagnosis not present

## 2014-09-09 DIAGNOSIS — I4891 Unspecified atrial fibrillation: Secondary | ICD-10-CM

## 2014-09-09 DIAGNOSIS — Z950 Presence of cardiac pacemaker: Secondary | ICD-10-CM | POA: Diagnosis not present

## 2014-09-09 DIAGNOSIS — I2510 Atherosclerotic heart disease of native coronary artery without angina pectoris: Secondary | ICD-10-CM | POA: Diagnosis not present

## 2014-09-09 DIAGNOSIS — Z5181 Encounter for therapeutic drug level monitoring: Secondary | ICD-10-CM | POA: Diagnosis not present

## 2014-09-09 DIAGNOSIS — N189 Chronic kidney disease, unspecified: Secondary | ICD-10-CM | POA: Diagnosis not present

## 2014-09-09 DIAGNOSIS — T84012D Broken internal right knee prosthesis, subsequent encounter: Secondary | ICD-10-CM | POA: Diagnosis not present

## 2014-09-09 LAB — POCT INR: INR: 2.2

## 2014-09-10 ENCOUNTER — Other Ambulatory Visit (HOSPITAL_COMMUNITY): Payer: Self-pay | Admitting: Internal Medicine

## 2014-09-10 ENCOUNTER — Ambulatory Visit (HOSPITAL_COMMUNITY)
Admission: RE | Admit: 2014-09-10 | Discharge: 2014-09-10 | Disposition: A | Discharge status: Home / Self Care | Payer: Medicare Other | Source: Ambulatory Visit | Attending: Internal Medicine | Admitting: Internal Medicine

## 2014-09-10 DIAGNOSIS — I639 Cerebral infarction, unspecified: Secondary | ICD-10-CM | POA: Insufficient documentation

## 2014-09-10 DIAGNOSIS — G458 Other transient cerebral ischemic attacks and related syndromes: Secondary | ICD-10-CM

## 2014-09-10 DIAGNOSIS — I6523 Occlusion and stenosis of bilateral carotid arteries: Secondary | ICD-10-CM | POA: Insufficient documentation

## 2014-09-10 DIAGNOSIS — G459 Transient cerebral ischemic attack, unspecified: Secondary | ICD-10-CM

## 2014-09-10 NOTE — Progress Notes (Signed)
*  PRELIMINARY RESULTS* Vascular Ultrasound Carotid Duplex (Doppler) has been completed.   Findings suggest 1-39% internal carotid artery stenosis bilaterally. Vertebral arteries are patent with antegrade flow.  09/10/2014 9:54 AM Maudry Mayhew, RVT, RDCS, RDMS

## 2014-09-16 DIAGNOSIS — H35033 Hypertensive retinopathy, bilateral: Secondary | ICD-10-CM | POA: Diagnosis not present

## 2014-09-16 DIAGNOSIS — H3412 Central retinal artery occlusion, left eye: Secondary | ICD-10-CM | POA: Diagnosis not present

## 2014-09-18 DIAGNOSIS — N189 Chronic kidney disease, unspecified: Secondary | ICD-10-CM | POA: Diagnosis not present

## 2014-09-18 DIAGNOSIS — Z5181 Encounter for therapeutic drug level monitoring: Secondary | ICD-10-CM | POA: Diagnosis not present

## 2014-09-18 DIAGNOSIS — I4891 Unspecified atrial fibrillation: Secondary | ICD-10-CM | POA: Diagnosis not present

## 2014-09-18 DIAGNOSIS — T84012D Broken internal right knee prosthesis, subsequent encounter: Secondary | ICD-10-CM | POA: Diagnosis not present

## 2014-09-18 DIAGNOSIS — I129 Hypertensive chronic kidney disease with stage 1 through stage 4 chronic kidney disease, or unspecified chronic kidney disease: Secondary | ICD-10-CM | POA: Diagnosis not present

## 2014-09-18 DIAGNOSIS — I2510 Atherosclerotic heart disease of native coronary artery without angina pectoris: Secondary | ICD-10-CM | POA: Diagnosis not present

## 2014-09-18 DIAGNOSIS — Z950 Presence of cardiac pacemaker: Secondary | ICD-10-CM | POA: Diagnosis not present

## 2014-09-18 DIAGNOSIS — Z4889 Encounter for other specified surgical aftercare: Secondary | ICD-10-CM | POA: Diagnosis not present

## 2014-09-23 DIAGNOSIS — Z4889 Encounter for other specified surgical aftercare: Secondary | ICD-10-CM | POA: Diagnosis not present

## 2014-09-23 DIAGNOSIS — Z950 Presence of cardiac pacemaker: Secondary | ICD-10-CM | POA: Diagnosis not present

## 2014-09-23 DIAGNOSIS — N189 Chronic kidney disease, unspecified: Secondary | ICD-10-CM | POA: Diagnosis not present

## 2014-09-23 DIAGNOSIS — Z5181 Encounter for therapeutic drug level monitoring: Secondary | ICD-10-CM | POA: Diagnosis not present

## 2014-09-23 DIAGNOSIS — T84012D Broken internal right knee prosthesis, subsequent encounter: Secondary | ICD-10-CM | POA: Diagnosis not present

## 2014-09-23 DIAGNOSIS — I2510 Atherosclerotic heart disease of native coronary artery without angina pectoris: Secondary | ICD-10-CM | POA: Diagnosis not present

## 2014-09-23 DIAGNOSIS — I4891 Unspecified atrial fibrillation: Secondary | ICD-10-CM | POA: Diagnosis not present

## 2014-09-23 DIAGNOSIS — I129 Hypertensive chronic kidney disease with stage 1 through stage 4 chronic kidney disease, or unspecified chronic kidney disease: Secondary | ICD-10-CM | POA: Diagnosis not present

## 2014-09-24 ENCOUNTER — Encounter: Payer: Self-pay | Admitting: Cardiology

## 2014-09-24 ENCOUNTER — Telehealth: Payer: Self-pay | Admitting: Cardiology

## 2014-09-24 ENCOUNTER — Ambulatory Visit (INDEPENDENT_AMBULATORY_CARE_PROVIDER_SITE_OTHER): Payer: Medicare Other | Admitting: Cardiology

## 2014-09-24 VITALS — BP 144/70 | HR 65 | Ht 61.0 in | Wt 156.0 lb

## 2014-09-24 DIAGNOSIS — I4891 Unspecified atrial fibrillation: Secondary | ICD-10-CM | POA: Diagnosis not present

## 2014-09-24 DIAGNOSIS — R0609 Other forms of dyspnea: Secondary | ICD-10-CM

## 2014-09-24 DIAGNOSIS — I495 Sick sinus syndrome: Secondary | ICD-10-CM | POA: Diagnosis not present

## 2014-09-24 DIAGNOSIS — M6250 Muscle wasting and atrophy, not elsewhere classified, unspecified site: Secondary | ICD-10-CM | POA: Diagnosis not present

## 2014-09-24 NOTE — Patient Instructions (Signed)
Your physician recommends that you continue on your current medications as directed. Please refer to the Current Medication list given to you today.  Your physician recommends that you schedule a follow-up appointment in: [name]

## 2014-09-24 NOTE — Telephone Encounter (Signed)
Note at front for pick up. °

## 2014-09-24 NOTE — Telephone Encounter (Signed)
New Msg       Pt daughter [name] calling, states she needs a note from today's visit and would like to pick it up tomorrow at front desk.   If any questions please contact [name].

## 2014-09-24 NOTE — Progress Notes (Signed)
[name] Date of Birth:  [birth year] [place] [address] [place], [place]  [address] [phone]        Fax   [phone]   History of Present Illness: This pleasant 81 year old woman is seen for a scheduled followup office visit. She has a past history of tachybradycardia syndrome . She has a permanent pacemaker implanted 07/01/10 . She has a remote history of repair of a patent ductus arteriosus. She has a history of moderate valvular aortic stenosis. She has had a history of congestive heart failure. Most recent admission was 08/25/12-08/29/12 for exacerbation of CHF.  She was hospitalized again in November 2015 for congestive heart failure and pneumonia.  Her last echocardiogram 08/26/12 showed ejection fraction of 60-65% with grade 2 diastolic dysfunction and a peak aortic valve gradient of 29 with a mean aortic valve gradient of 14.  Her more recent echocardiogram in September 2015 showed ejection fraction of 55-60%.  The aortic valve showed mild stenosis and moderate regurgitation. She is still wearing a brace on her right knee.  She has had a total of 3 operations on her right leg.  Dr. Alvan Dame is her orthopedist. 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  She had carotid Dopplers which did not show any significant obstruction.     Current Outpatient Prescriptions  Medication Sig Dispense Refill  . ALPRAZolam (XANAX) 0.5 MG tablet Take 0.25 mg by mouth 3 (three) times daily as needed for anxiety.     [name] atenolol (TENORMIN) 50 MG tablet Take 1 1/2 to 2 tabs BID for BP 180 tablet 1  . Biotin 5000 MCG CAPS Take 10,000 mcg by mouth every morning.     . Cholecalciferol (VITAMIN D PO) Take 5,000 Units by mouth every morning.     . furosemide (LASIX) 20 MG tablet Take 20 mg by mouth daily.    [name] levothyroxine (SYNTHROID, LEVOTHROID) 100 MCG tablet Take 50 mcg by mouth  daily before breakfast.     . Loperamide HCl (IMODIUM A-D PO) Take 0.5-1 tablets by mouth as needed. Patient takes 1 tablet in the am and .5 tablet in the pm    . losartan (COZAAR) 100 MG tablet Take 100 mg by mouth daily.    . meclizine (ANTIVERT) 25 MG tablet Take 25 mg by mouth as needed for dizziness or nausea.    . methocarbamol (ROBAXIN) 500 MG tablet Take 1 tablet (500 mg total) by mouth every 6 (six) hours as needed for muscle spasms. 50 tablet 0  . nitroGLYCERIN (NITROSTAT) 0.4 MG SL tablet Place 0.4 mg under the tongue every 5 (five) minutes as needed for chest pain.    . potassium chloride SA (K-DUR,KLOR-CON) 20 MEQ tablet Take 20 mEq by mouth 2 (two) times daily.    . pregabalin (LYRICA) 75 MG capsule Take 75 mg by mouth 2 (two) times daily.    . ranitidine (ZANTAC) 150 MG tablet Take 150 mg by mouth 2 (two) times daily.     . sertraline (ZOLOFT) 50 MG tablet Take 50 mg by mouth every evening.     . traMADol (ULTRAM) 50 MG tablet Take 1-2 tablets (50-100 mg total) by mouth every 6 (six) hours as needed. (Patient taking differently: Take 25 mg by mouth every 6 (six) hours as needed for moderate pain or severe pain. ) 100 tablet 0  .  triamcinolone cream (KENALOG) 0.1 % Apply 1 application topically as needed.   3  . warfarin (COUMADIN) 2 MG tablet Take 2 mg by mouth daily. Take 3 mg on Monday and 2 mg all other days.     No current facility-administered medications for this visit.    Allergies  Allergen Reactions  . Codeine Nausea And Vomiting  . Hydrocodone Other (See Comments)    hallucination  . Morphine And Related Nausea And Vomiting  . Requip [Ropinirole Hcl]     Headache   . Zinc     nausea    Patient Active Problem List   Diagnosis Date Noted  . Atrial fibrillation 12/02/2010    Priority: High  . CHF exacerbation 08/02/2014  . Acute exacerbation of CHF (congestive heart failure) 08/02/2014  . Rupture patellar tendon 07/29/2014  . Diarrhea 07/05/2014  .  Essential hypertension 06/04/2014  . Edema 06/04/2014  . Disorder of peripheral nervous system 06/04/2014  . History of anticoagulant therapy 06/04/2014  . Elevated LFTs 05/28/2014  . Renal insufficiency, mild 05/28/2014  . Syncope 05/28/2014  . Uterine cancer (1995) 02/26/2014  . Medication management 02/15/2014  . Overweight (BMI 25.0-29.9) 12/18/2013  . Infection of prosthetic knee joint 12/17/2013  . PreDiabetes (Jan 2011) 09/24/2013  . Unspecified vitamin D deficiency (19 in 2008) 09/24/2013  . Benign hypertensive heart disease without heart failure   . Hyperlipemia   . Cardiac pacemaker (06/2010) 08/26/2012  . S/P right TKA 02/07/2012  . FHx/o Colon Cancer 07/27/2011  . Osteoarthritis 07/09/2011  . Aortic stenosis, moderate 07/09/2011  . S/P repair of patent ductus arteriosus 04/22/2011  . Tachycardia-bradycardia syndrome 12/24/2010  . Renal Cell Cancer ( 2003) 11/17/2007  . Hypothyroidism 11/17/2007  . ANXIETY 11/17/2007  . DEPRESSION 11/17/2007  . RESTLESS LEG SYNDROME 11/17/2007  . Gastroparesis 11/17/2007  . RADIATION PROCTITIS 11/17/2007  . FIBROCYSTIC BREAST DISEASE 11/17/2007  . OSTEOMYELITIS 11/17/2007  . CARCINOMA, UTERUS, HX OF 11/17/2007    History  Smoking status  . Never Smoker   Smokeless tobacco  . Never Used    History  Alcohol Use No    Family History  Problem Relation Age of Onset  . Colon cancer Mother 23  . Heart disease Mother   . Heart disease Father   . Heart attack Father   . Heart disease Maternal Grandmother     Review of Systems: Constitutional: no fever chills diaphoresis or fatigue or change in weight.  Head and neck: no hearing loss, no epistaxis, no photophobia or visual disturbance. Respiratory: No cough, shortness of breath or wheezing. Cardiovascular: No chest pain peripheral edema, palpitations. Gastrointestinal: No abdominal distention, no abdominal pain, no change in bowel habits hematochezia or  melena. Genitourinary: No dysuria, no frequency, no urgency, no nocturia. Musculoskeletal:No arthralgias, no back pain, no gait disturbance or myalgias. Neurological: No dizziness, no headaches, no numbness, no seizures, no syncope, no weakness, no tremors. Hematologic: No lymphadenopathy, no easy bruising. Psychiatric: No confusion, no hallucinations, no sleep disturbance.    Physical Exam: Filed Vitals:   09/24/14 0950  BP: 144/70  Pulse: 65  The patient appears to be in no distress.  She is walking with a walker.  The right leg is in a rigid brace to prevent flexion at the knee.  Head and neck exam reveals that she is wearing a patch over her left eye.   Mouth and pharynx are benign.  No lymphovadenopathy.  No carotid bruits.  The jugular venous pressure is normal.  Thyroid is not enlarged or tender.  Chest is clear to percussion and auscultation.  No rales or rhonchi.  Expansion of the chest is symmetrical.  Heart reveals no abnormal lift or heave.  First and second heart sounds are normal.  There is grade 2/6 systolic ejection murmur at the base.  The abdomen is soft and nontender.  Bowel sounds are normoactive.  There is no hepatosplenomegaly or mass.  There are no abdominal bruits.  Extremities reveal no phlebitis or edema.  Pedal pulses are good.  There is no cyanosis or clubbing.  She is wearing a brace on her right lower leg  Neurologic exam is normal strength and no lateralizing weakness.  No sensory deficits.  Integument reveals no rash    Assessment / Plan: 1.  Paroxysmal atrial fibrillation. 2. tachybradycardia syndrome with functioning dual-chamber pacemaker. 3. essential hypertension without heart failure. 4. hypercholesterolemia followed by PCP 5. osteoarthritis of right knee followed by Dr. Alvan Dame 6. mild to moderate aortic stenosis 7. history of left nephrectomy secondary to kidney cancer 8. history of endometrial cancer 9. GERD 10.  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  Recheck in 4 months for office visit and EKG

## 2014-09-25 ENCOUNTER — Ambulatory Visit (INDEPENDENT_AMBULATORY_CARE_PROVIDER_SITE_OTHER): Payer: Medicare Other | Admitting: Internal Medicine

## 2014-09-25 ENCOUNTER — Encounter: Payer: Self-pay | Admitting: Internal Medicine

## 2014-09-25 VITALS — BP 134/72 | HR 76 | Temp 98.1°F | Resp 16 | Ht 61.0 in | Wt 157.0 lb

## 2014-09-25 DIAGNOSIS — E785 Hyperlipidemia, unspecified: Secondary | ICD-10-CM

## 2014-09-25 DIAGNOSIS — I10 Essential (primary) hypertension: Secondary | ICD-10-CM

## 2014-09-25 DIAGNOSIS — E559 Vitamin D deficiency, unspecified: Secondary | ICD-10-CM | POA: Diagnosis not present

## 2014-09-25 DIAGNOSIS — I4891 Unspecified atrial fibrillation: Secondary | ICD-10-CM

## 2014-09-25 DIAGNOSIS — I119 Hypertensive heart disease without heart failure: Secondary | ICD-10-CM | POA: Diagnosis not present

## 2014-09-25 DIAGNOSIS — Z9181 History of falling: Secondary | ICD-10-CM

## 2014-09-25 DIAGNOSIS — Z1331 Encounter for screening for depression: Secondary | ICD-10-CM

## 2014-09-25 DIAGNOSIS — Z79899 Other long term (current) drug therapy: Secondary | ICD-10-CM

## 2014-09-25 DIAGNOSIS — E039 Hypothyroidism, unspecified: Secondary | ICD-10-CM

## 2014-09-25 DIAGNOSIS — R7309 Other abnormal glucose: Secondary | ICD-10-CM | POA: Diagnosis not present

## 2014-09-25 DIAGNOSIS — I350 Nonrheumatic aortic (valve) stenosis: Secondary | ICD-10-CM

## 2014-09-25 DIAGNOSIS — R7303 Prediabetes: Secondary | ICD-10-CM

## 2014-09-25 DIAGNOSIS — F32A Depression, unspecified: Secondary | ICD-10-CM

## 2014-09-25 DIAGNOSIS — F329 Major depressive disorder, single episode, unspecified: Secondary | ICD-10-CM

## 2014-09-25 DIAGNOSIS — Z1212 Encounter for screening for malignant neoplasm of rectum: Secondary | ICD-10-CM

## 2014-09-25 LAB — HEMOGLOBIN A1C
Hgb A1c MFr Bld: 5.5 % (ref <5.7)
Mean Plasma Glucose: 111 mg/dL (ref <117)

## 2014-09-25 NOTE — Progress Notes (Signed)
Patient ID: [name], female   DOB: 01/14/1934, 81 y.o.   MRN: [id number]  Annual Comprehensive Examination  This very nice 81 y.o. MWF presents for complete physical.  Patient has been followed for HTN, ASCAD/CHF/cAfib/SSS/Perm Pacemaker, Prediabetes, Hyperlipidemia  and Vitamin D Deficiency.   Of note, patient had right Knee surgery x 3 in April, July & Nov 2015 following Rt TKR in June 2014. Currently , she is in a long right leg/knee brace.     HTN predates since the 1960's. Patient is followed by Dr Mare Ferrari for Tachy/brady syndrome, [facility] Pacemaker, hx/o [facility] CHF. Patient's BP has been controlled at home and patient denies any cardiac symptoms as chest pain, palpitations, shortness of breath, dizziness or ankle swelling. Today's BP: 134/72 mmHg    Patient's hyperlipidemia is controlled with diet and medications. Patient denies myalgias or other medication SE's. Last lipids were at goal - Total Chol  87; HDL 30; LDL 37; Trig 102 on 02/15/2014.   Patient has  prediabetes predating since Jan 2011 with an A1c of 5.7% and patient denies reactive hypoglycemic symptoms, visual blurring, diabetic polys, or paresthesias. Last A1c was  5.6% on 02/15/2014.   Finally, patient has history of Vitamin D Deficiency of 19 in 2008 and last Vitamin D was 77 on 02/15/2014.  Medication Sig  . ALPRAZolam (XANAX) 0.5 MG tablet Take 0.25 mg by mouth 3 (three) times daily as needed for anxiety.   [name] atenolol (TENORMIN) 50 MG tablet Take 1 1/2 to 2 tabs BID for BP  . Biotin 5000 MCG CAPS Take 10,000 mcg by mouth every morning.   . Cholecalciferol (VITAMIN D PO) Take 5,000 Units by mouth every morning.   . furosemide (LASIX) 20 MG tablet Take 20 mg by mouth daily.  [name] levothyroxine  100 MCG tablet Take 50 mcg by mouth daily before breakfast.   . Loperamide HCl (IMODIUM A-D PO) Take 0.5-1 tablets by mouth as needed. Patient takes 1 tablet in the am and .5 tablet in the pm  . losartan (COZAAR) 100 MG tablet Take  100 mg by mouth daily.  . meclizine (ANTIVERT) 25 MG tablet Take 25 mg by mouth as needed for dizziness or nausea.  . methocarbamol (ROBAXIN) 500 MG tablet Take 1 tablet (500 mg total) by mouth every 6 (six) hours as needed for muscle spasms.  [name] NITROSTAT 0.4 MG SL tablet Place 0.4 mg under the tongue every 5 (five) minutes as needed for chest pain.  [name] K-DUR 20 MEQ tablet Take 20 mEq by mouth 2 (two) times daily.  . pregabalin (LYRICA) 75 MG capsule Take 75 mg by mouth 2 (two) times daily.  . ranitidine (ZANTAC) 150 MG tablet Take 150 mg by mouth 2 (two) times daily.   . sertraline (ZOLOFT) 50 MG tablet Take 50 mg by mouth every evening.   . traMADol (ULTRAM) 50 MG tablet Take 1-2 tablets (50-100 mg total) by mouth every 6 (six) hours as needed.   . triamcinolone cream (KENALOG) 0.1 % Apply 1 application topically as needed.   . warfarin (COUMADIN) 2 MG tablet Take 2 mg by mouth daily. Take 3 mg on Monday and 2 mg all other days.   Allergies  Allergen Reactions  . Codeine Nausea And Vomiting  . Hydrocodone Other (See Comments)    hallucination  . Morphine And Related Nausea And Vomiting  . Requip [Ropinirole Hcl]     Headache   . Zinc     nausea   Past  Medical History  Diagnosis Date  . PAF (paroxysmal atrial fibrillation)     controlled with amiodarone, on coumadin  . CAD (coronary artery disease)   . Aortic root dilatation   . Moderate aortic stenosis   . Chronic anticoagulation   . Tachycardia-bradycardia syndrome     s/p PPM by Dr Doreatha Lew ([facility]) 07/03/10  . GERD (gastroesophageal reflux disease)   . Pacemaker     2012  . Neuropathy     feet/legs  . Restless leg syndrome   . Headache(784.0)     hx migraines - takes Zoloft for migraines  . History of uterine cancer 1980s    treated with hysterectomy, external radiation and radiation seed implants.   . Cancer     hx Kidney cancer / hx of endometrial cancer  . PONV (postoperative nausea and vomiting)   . Anxiety   .  Heart murmur   . HTN (hypertension)   . Hyperlipemia   . Hypothyroidism   . Osteomyelitis as child  . Family history of anesthesia complication     daughter has ponv  . Syncope 05/28/2014  . Hepatic steatosis   . Elevated LFTs   . Gastroparesis   . Anal fissure   . Radiation proctitis   . Gastric polyp     adenomatous  . Neuromuscular disorder     neuropathy in right leg  . Chronic kidney disease     only has one kidney   . 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 DEXA SCAN  02/11/2001  . PNEUMOCOCCAL POLYSACCHARIDE VACCINE AGE 82 AND OVER  02/11/2001  . TETANUS/TDAP  09/06/2012  . INFLUENZA VACCINE  04/07/2015  . COLONOSCOPY  08/10/2016  . ZOSTAVAX  Completed   Immunization History  Administered Date(s) Administered  . Influenza,inj,Quad PF,36+ Mos 05/31/2014  . Influenza-Unspecified 09/16/2009, 07/03/2013  . Pneumococcal Conjugate-13 09/06/2002  . Td 09/06/2002  . Zoster 09/06/2008   Past Surgical History  Procedure Laterality Date  . Cardiac catheterization  2008  . Nephrectomy Left     renal cell cancer  . Total abdominal hysterectomy    . Patent ductus arterious repair  age 74  . Ptvp    . Pacemaker insertion  07/04/11    by Dr Doreatha Lew  . Cholecystectomy  1970s  . Upper gastrointestinal endoscopy    . Total knee arthroplasty  02/07/2012    Procedure: TOTAL KNEE ARTHROPLASTY;  Surgeon: Mauri Pole, MD;  Location: [facility];  Service: Orthopedics;  Laterality: Right;  . Insert / replace / remove pacemaker    . Joint replacement      rt total hip 2009  . I&d knee with poly exchange Right 12/17/2013    Procedure: IRRIGATION AND DEBRIDEMEN RIGHT TOTAL  KNEE WITH POLY EXCHANGE;  Surgeon: Mauri Pole, MD;  Location: [facility];  Service: Orthopedics;  Laterality: Right;  . Patellar tendon repair Right 03/06/2014    Procedure: AVULSION WITH PATELLA TENDON REPAIR;  Surgeon: Mauri Pole, MD;  Location: [facility];  Service: Orthopedics;   Laterality: Right;  . Total knee revision Right 07/29/2014    Procedure: RIGHT KNEE REVISION OF PREVIOUS REPAIR EXTENSOR MECHANISM;  Surgeon: Mauri Pole, MD;  Location: [facility];  Service: Orthopedics;  Laterality: Right;   Family History  Problem Relation Age of Onset  . Colon cancer Mother 59  . Heart disease Mother   . Heart disease Father   . Heart attack Father   .  Heart disease Maternal Grandmother    History  Substance Use Topics  . Smoking status: Never Smoker   . Smokeless tobacco: Never Used  . Alcohol Use: No    ROS Constitutional: Denies fever, chills, weight loss/gain, headaches, insomnia, fatigue, night sweats, and change in appetite. Eyes: Denies redness, blurred vision, diplopia, discharge, itchy, watery eyes.  ENT: Denies discharge, congestion, post nasal drip, epistaxis, sore throat, earache, hearing loss, dental pain, Tinnitus, Vertigo, Sinus pain, snoring.  Cardio: Denies chest pain, palpitations, irregular heartbeat, syncope, dyspnea, diaphoresis, orthopnea, PND, claudication, edema Respiratory: denies cough, dyspnea, DOE, pleurisy, hoarseness, laryngitis, wheezing.  Gastrointestinal: Denies dysphagia, heartburn, reflux, water brash, pain, cramps, nausea, vomiting, bloating, diarrhea, constipation, hematemesis, melena, hematochezia, jaundice, hemorrhoids Genitourinary: Denies dysuria, frequency, urgency, nocturia, hesitancy, discharge, hematuria, flank pain Breast: Breast lumps, nipple discharge, bleeding.  Musculoskeletal: Denies arthralgia, myalgia, stiffness, Jt. Swelling, pain, limp, and strain/sprain. Denies falls. Skin: Denies puritis, rash, hives, warts, acne, eczema, changing in skin lesion Neuro: No weakness, tremor, incoordination, spasms, paresthesia, pain Psychiatric: Denies confusion, memory loss, sensory loss. Denies Depression. Endocrine: Denies change in weight, skin, hair change, nocturia, and paresthesia, diabetic polys, visual blurring, hyper /  hypo glycemic episodes.  Heme/Lymph: No excessive bleeding, bruising, enlarged lymph nodes.  Physical Exam  BP 134/72   P 76  T 98.1 F   R16  Ht 5\' 1"    Wt 157 lb     BMI 29.68   General Appearance: Well nourished and in no apparent distress. Eyes: PERRLA, EOMs, conjunctiva no swelling or erythema, normal fundi and vessels. Sinuses: No frontal/maxillary tenderness ENT/Mouth: EACs patent / TMs  nl. Nares clear without erythema, swelling, mucoid exudates. Oral hygiene is good. No erythema, swelling, or exudate. Tongue normal, non-obstructing. Tonsils not swollen or erythematous. Hearing normal.  Neck: Supple, thyroid normal. No bruits, nodes or JVD. Respiratory: Respiratory effort normal.  BS equal and clear bilateral without rales, rhonci, wheezing or stridor. Cardio: Heart sounds are normal with sl irregular rate and rhythm and gr 1-2 sys murmur and no rubs or gallops. Peripheral pulses are normal and equal bilaterally without edema. No aortic or femoral bruits. Chest: symmetric with normal excursions and percussion. Breasts: Symmetric, without lumps, nipple discharge, retractions, or fibrocystic changes.  Abdomen: Flat, soft, with bowl sounds. Nontender, no guarding, rebound, hernias, masses, or organomegaly.  Lymphatics: Non tender without lymphadenopathy.  Genitourinary:  Musculoskeletal: Right knee in long leg brace. Using a walker for stability. Skin: Warm and dry without rashes, lesions, cyanosis, clubbing or  ecchymosis.  Neuro: Cranial nerves intact, reflexes equal bilaterally. Normal muscle tone, no cerebellar symptoms. Sensation intact.  Pysch: Awake and oriented X 3, normal affect, Insight and Judgment appropriate.   Assessment and Plan  1. Essential hypertension  - Microalbumin / creatinine urine ratio - EKG 12-Lead - [place], RETROPERITNL ABD,  LTD - TSH  2. HTHD   3. Atrial fibrillation   4. Aortic stenosis, moderate   5. Hypothyroidism   6.  Hyperlipemia  - Lipid panel  7. Prediabetes  - Hemoglobin A1c - Insulin, fasting  8. Depression   9. Vitamin D deficiency  - Vit D  25 hydroxy   10. Medication management  - Urine Microscopic - CBC with Differential - BASIC METABOLIC PANEL WITH GFR - Hepatic function panel - Magnesium  11. Screening for rectal cancer  - POC Hemoccult Bld/Stl   12. Depression screen  -(+) for mild depression  13. At low risk for fall   Continue prudent diet as  discussed, weight control, BP monitoring, regular exercise, and medications. Discussed med's effects and SE's. Screening labs and tests as requested with regular follow-up as recommended.

## 2014-09-25 NOTE — Patient Instructions (Signed)
Recommend the book "The END of DIETING" by Dr Excell Seltzer   & the book "The END of DIABETES " by Dr Excell Seltzer  At [email or web address] - get book & Audio CD's      Being diabetic has a  300% increased risk for heart attack, stroke, cancer, and alzheimer- type vascular dementia. It is very important that you work harder with diet by avoiding all foods that are white except chicken & fish. Avoid white rice (brown & wild rice is OK), white potatoes (sweetpotatoes in moderation is OK), White bread or wheat bread or anything made out of white flour like bagels, donuts, rolls, buns, biscuits, cakes, pastries, cookies, pizza crust, and pasta (made from white flour & egg whites) - vegetarian pasta or spinach or wheat pasta is OK. Multigrain breads like Arnold's or [employer], or multigrain sandwich thins or flatbreads.  Diet, exercise and weight loss can reverse and cure diabetes in the early stages.  Diet, exercise and weight loss is very important in the control and prevention of complications of diabetes which affects every system in your body, ie. Brain - dementia/stroke, eyes - glaucoma/blindness, heart - heart attack/heart failure, kidneys - dialysis, stomach - gastric paralysis, intestines - malabsorption, nerves - severe painful neuritis, circulation - gangrene & loss of a leg(s), and finally cancer and Alzheimers.    I recommend avoid fried & greasy foods,  sweets/candy, white rice (brown or wild rice or Quinoa is OK), white potatoes (sweet potatoes are OK) - anything made from white flour - bagels, doughnuts, rolls, buns, biscuits,white and wheat breads, pizza crust and traditional pasta made of white flour & egg white(vegetarian pasta or spinach or wheat pasta is OK).  Multi-grain bread is OK - like multi-grain flat bread or sandwich thins. Avoid alcohol in excess. Exercise is also important.    Eat all the vegetables you want - avoid meat, especially red meat and dairy - especially cheese.  Cheese  is the most concentrated form of trans-fats which is the worst thing to clog up our arteries. Veggie cheese is OK which can be found in the fresh produce section at [employer] or [employer] or [employer]  Preventive Care for Adults A healthy lifestyle and preventive care can promote health and wellness. Preventive health guidelines for women include the following key practices.  A routine yearly physical is a good way to check with your health care provider about your health and preventive screening. It is a chance to share any concerns and updates on your health and to receive a thorough exam.  Visit your dentist for a routine exam and preventive care every 6 months. Brush your teeth twice a day and floss once a day. Good oral hygiene prevents tooth decay and gum disease.  The frequency of eye exams is based on your age, health, family medical history, use of contact lenses, and other factors. Follow your health care provider's recommendations for frequency of eye exams.  Eat a healthy diet. Foods like vegetables, fruits, whole grains, low-fat dairy products, and lean protein foods contain the nutrients you need without too many calories. Decrease your intake of foods high in solid fats, added sugars, and salt. Eat the right amount of calories for you.Get information about a proper diet from your health care provider, if necessary.  Regular physical exercise is one of the most important things you can do for your health. Most adults should get at least 150 minutes of moderate-intensity exercise (any activity that increases  your heart rate and causes you to sweat) each week. In addition, most adults need muscle-strengthening exercises on 2 or more days a week.  Maintain a healthy weight. The body mass index (BMI) is a screening tool to identify possible weight problems. It provides an estimate of body fat based on height and weight. Your health care provider can find your BMI and can help you  achieve or maintain a healthy weight.For adults 20 years and older:  A BMI below 18.5 is considered underweight.  A BMI of 18.5 to 24.9 is normal.  A BMI of 25 to 29.9 is considered overweight.  A BMI of 30 and above is considered obese.  Maintain normal blood lipids and cholesterol levels by exercising and minimizing your intake of saturated fat. Eat a balanced diet with plenty of fruit and vegetables. If your lipid or cholesterol levels are high, you are over 50, or you are at high risk for heart disease, you may need your cholesterol levels checked more frequently.Ongoing high lipid and cholesterol levels should be treated with medicines if diet and exercise are not working.  If you smoke, find out from your health care provider how to quit. If you do not use tobacco, do not start.  Lung cancer screening is recommended for adults aged 55-80 years who are at high risk for developing lung cancer because of a history of smoking. A yearly low-dose CT scan of the lungs is recommended for people who have at least a 30-pack-year history of smoking and are a current smoker or have quit within the past 15 years. A pack year of smoking is smoking an average of 1 pack of cigarettes a day for 1 year (for example: 1 pack a day for 30 years or 2 packs a day for 15 years). Yearly screening should continue until the smoker has stopped smoking for at least 15 years. Yearly screening should be stopped for people who develop a health problem that would prevent them from having lung cancer treatment.  Avoid use of street drugs. Do not share needles with anyone. Ask for help if you need support or instructions about stopping the use of drugs.  High blood pressure causes heart disease and increases the risk of stroke.  Ongoing high blood pressure should be treated with medicines if weight loss and exercise do not work.  If you are 55-79 years old, ask your health care provider if you should take aspirin to  prevent strokes.  Diabetes screening involves taking a blood sample to check your fasting blood sugar level. This should be done once every 3 years, after age 45, if you are within normal weight and without risk factors for diabetes. Testing should be considered at a younger age or be carried out more frequently if you are overweight and have at least 1 risk factor for diabetes.  Breast cancer screening is essential preventive care for women. You should practice "breast self-awareness." This means understanding the normal appearance and feel of your breasts and may include breast self-examination. Any changes detected, no matter how small, should be reported to a health care provider. Women in their 20s and 30s should have a clinical breast exam (CBE) by a health care provider as part of a regular health exam every 1 to 3 years. After age 40, women should have a CBE every year. Starting at age 40, women should consider having a mammogram (breast X-ray test) every year. Women who have a family history of breast cancer should   talk to their health care provider about genetic screening. Women at a high risk of breast cancer should talk to their health care providers about having an MRI and a mammogram every year.  Breast cancer gene (BRCA)-related cancer risk assessment is recommended for women who have family members with BRCA-related cancers. BRCA-related cancers include breast, ovarian, tubal, and peritoneal cancers. Having family members with these cancers may be associated with an increased risk for harmful changes (mutations) in the breast cancer genes BRCA1 and BRCA2. Results of the assessment will determine the need for genetic counseling and BRCA1 and BRCA2 testing.  Routine pelvic exams to screen for cancer are no longer recommended for nonpregnant women who are considered low risk for cancer of the pelvic organs (ovaries, uterus, and vagina) and who do not have symptoms. Ask your health care provider  if a screening pelvic exam is right for you.  If you have had past treatment for cervical cancer or a condition that could lead to cancer, you need Pap tests and screening for cancer for at least 20 years after your treatment. If Pap tests have been discontinued, your risk factors (such as having a new sexual partner) need to be reassessed to determine if screening should be resumed. Some women have medical problems that increase the chance of getting cervical cancer. In these cases, your health care provider may recommend more frequent screening and Pap tests.    Colorectal cancer can be detected and often prevented. Most routine colorectal cancer screening begins at the age of 90 years and continues through age 8 years. However, your health care provider may recommend screening at an earlier age if you have risk factors for colon cancer. On a yearly basis, your health care provider may provide home test kits to check for hidden blood in the stool. Use of a small camera at the end of a tube, to directly examine the colon (sigmoidoscopy or colonoscopy), can detect the earliest forms of colorectal cancer. Talk to your health care provider about this at age 86, when routine screening begins. Direct exam of the colon should be repeated every 5-10 years through age 32 years, unless early forms of pre-cancerous polyps or small growths are found.  Osteoporosis is a disease in which the bones lose minerals and strength with aging. This can result in serious bone fractures or breaks. The risk of osteoporosis can be identified using a bone density scan. Women ages 75 years and over and women at risk for fractures or osteoporosis should discuss screening with their health care providers. Ask your health care provider whether you should take a calcium supplement or vitamin D to reduce the rate of osteoporosis.  Menopause can be associated with physical symptoms and risks. Hormone replacement therapy is available to  decrease symptoms and risks. You should talk to your health care provider about whether hormone replacement therapy is right for you.  Use sunscreen. Apply sunscreen liberally and repeatedly throughout the day. You should seek shade when your shadow is shorter than you. Protect yourself by wearing long sleeves, pants, a wide-brimmed hat, and sunglasses year round, whenever you are outdoors.  Once a month, do a whole body skin exam, using a mirror to look at the skin on your back. Tell your health care provider of new moles, moles that have irregular borders, moles that are larger than a pencil eraser, or moles that have changed in shape or color.  Stay current with required vaccines (immunizations).  Influenza vaccine. All adults  should be immunized every year.  Tetanus, diphtheria, and acellular pertussis (Td, Tdap) vaccine. Pregnant women should receive 1 dose of Tdap vaccine during each pregnancy. The dose should be obtained regardless of the length of time since the last dose. Immunization is preferred during the 27th-36th week of gestation. An adult who has not previously received Tdap or who does not know her vaccine status should receive 1 dose of Tdap. This initial dose should be followed by tetanus and diphtheria toxoids (Td) booster doses every 10 years. Adults with an unknown or incomplete history of completing a 3-dose immunization series with Td-containing vaccines should begin or complete a primary immunization series including a Tdap dose. Adults should receive a Td booster every 10 years.    Zoster vaccine. One dose is recommended for adults aged 44 years or older unless certain conditions are present.    Pneumococcal 13-valent conjugate (PCV13) vaccine. When indicated, a person who is uncertain of her immunization history and has no record of immunization should receive the PCV13 vaccine. An adult aged 74 years or older who has certain medical conditions and has not been previously  immunized should receive 1 dose of PCV13 vaccine. This PCV13 should be followed with a dose of pneumococcal polysaccharide (PPSV23) vaccine. The PPSV23 vaccine dose should be obtained at least 8 weeks after the dose of PCV13 vaccine. An adult aged 66 years or older who has certain medical conditions and previously received 1 or more doses of PPSV23 vaccine should receive 1 dose of PCV13. The PCV13 vaccine dose should be obtained 1 or more years after the last PPSV23 vaccine dose.    Pneumococcal polysaccharide (PPSV23) vaccine. When PCV13 is also indicated, PCV13 should be obtained first. All adults aged 89 years and older should be immunized. An adult younger than age 80 years who has certain medical conditions should be immunized. Any person who resides in a nursing home or long-term care facility should be immunized. An adult smoker should be immunized. People with an immunocompromised condition and certain other conditions should receive both PCV13 and PPSV23 vaccines. People with human immunodeficiency virus (HIV) infection should be immunized as soon as possible after diagnosis. Immunization during chemotherapy or radiation therapy should be avoided. Routine use of PPSV23 vaccine is not recommended for American Indians, [place] Natives, or people younger than 65 years unless there are medical conditions that require PPSV23 vaccine. When indicated, people who have unknown immunization and have no record of immunization should receive PPSV23 vaccine. One-time revaccination 5 years after the first dose of PPSV23 is recommended for people aged 19-64 years who have chronic kidney failure, nephrotic syndrome, asplenia, or immunocompromised conditions. People who received 1-2 doses of PPSV23 before age 34 years should receive another dose of PPSV23 vaccine at age 31 years or later if at least 5 years have passed since the previous dose. Doses of PPSV23 are not needed for people immunized with PPSV23 at or after  age 29 years.   Preventive Services / Frequency  Ages 55 years and over  Blood pressure check.  Lipid and cholesterol check.  Lung cancer screening. / Every year if you are aged 29-80 years and have a 30-pack-year history of smoking and currently smoke or have quit within the past 15 years. Yearly screening is stopped once you have quit smoking for at least 15 years or develop a health problem that would prevent you from having lung cancer treatment.  Clinical breast exam.** / Every year after age 40 years.  BRCA-related cancer risk assessment.** / For women who have family members with a BRCA-related cancer (breast, ovarian, tubal, or peritoneal cancers).  Mammogram.** / Every year beginning at age 40 years and continuing for as long as you are in good health. Consult with your health care provider.  Pap test.** / Every 3 years starting at age 30 years through age 65 or 70 years with 3 consecutive normal Pap tests. Testing can be stopped between 65 and 70 years with 3 consecutive normal Pap tests and no abnormal Pap or HPV tests in the past 10 years.  Fecal occult blood test (FOBT) of stool. / Every year beginning at age 50 years and continuing until age 75 years. You may not need to do this test if you get a colonoscopy every 10 years.  Flexible sigmoidoscopy or colonoscopy.** / Every 5 years for a flexible sigmoidoscopy or every 10 years for a colonoscopy beginning at age 50 years and continuing until age 75 years.  Hepatitis C blood test.** / For all people born from 1945 through 1965 and any individual with known risks for hepatitis C.  Osteoporosis screening.** / A one-time screening for women ages 65 years and over and women at risk for fractures or osteoporosis.  Skin self-exam. / Monthly.  Influenza vaccine. / Every year.  Tetanus, diphtheria, and acellular pertussis (Tdap/Td) vaccine.** / 1 dose of Td every 10 years.  Zoster vaccine.** / 1 dose for adults aged 60 years  or older.  Pneumococcal 13-valent conjugate (PCV13) vaccine.** / Consult your health care provider.  Pneumococcal polysaccharide (PPSV23) vaccine.** / 1 dose for all adults aged 65 years and older. Screening for abdominal aortic aneurysm (AAA)  by ultrasound is recommended for people who have history of high blood pressure or who are current or former smokers. 

## 2014-09-26 ENCOUNTER — Encounter: Payer: Self-pay | Admitting: Internal Medicine

## 2014-09-26 DIAGNOSIS — Z950 Presence of cardiac pacemaker: Secondary | ICD-10-CM | POA: Diagnosis not present

## 2014-09-26 DIAGNOSIS — Z4889 Encounter for other specified surgical aftercare: Secondary | ICD-10-CM | POA: Diagnosis not present

## 2014-09-26 DIAGNOSIS — Z5181 Encounter for therapeutic drug level monitoring: Secondary | ICD-10-CM | POA: Diagnosis not present

## 2014-09-26 DIAGNOSIS — I4891 Unspecified atrial fibrillation: Secondary | ICD-10-CM | POA: Diagnosis not present

## 2014-09-26 DIAGNOSIS — I129 Hypertensive chronic kidney disease with stage 1 through stage 4 chronic kidney disease, or unspecified chronic kidney disease: Secondary | ICD-10-CM | POA: Diagnosis not present

## 2014-09-26 DIAGNOSIS — I2510 Atherosclerotic heart disease of native coronary artery without angina pectoris: Secondary | ICD-10-CM | POA: Diagnosis not present

## 2014-09-26 DIAGNOSIS — T84012D Broken internal right knee prosthesis, subsequent encounter: Secondary | ICD-10-CM | POA: Diagnosis not present

## 2014-09-26 DIAGNOSIS — N189 Chronic kidney disease, unspecified: Secondary | ICD-10-CM | POA: Diagnosis not present

## 2014-09-26 LAB — LIPID PANEL
CHOL/HDL RATIO: 6.1 ratio
CHOLESTEROL: 219 mg/dL — AB (ref 0–200)
HDL: 36 mg/dL — AB (ref >39)
LDL Cholesterol: 119 mg/dL — ABNORMAL HIGH (ref 0–99)
Triglycerides: 318 mg/dL — ABNORMAL HIGH (ref <150)
VLDL: 64 mg/dL — ABNORMAL HIGH (ref 0–40)

## 2014-09-26 LAB — URINALYSIS, MICROSCOPIC ONLY
Casts: NONE SEEN
Crystals: NONE SEEN
Squamous Epithelial / LPF: NONE SEEN

## 2014-09-26 LAB — BASIC METABOLIC PANEL WITH GFR
BUN: 26 mg/dL — AB (ref 6–23)
CALCIUM: 9.2 mg/dL (ref 8.4–10.5)
CHLORIDE: 108 meq/L (ref 96–112)
CO2: 25 mEq/L (ref 19–32)
CREATININE: 1.1 mg/dL (ref 0.50–1.10)
GFR, EST AFRICAN AMERICAN: 56 mL/min — AB
GFR, Est Non African American: 48 mL/min — ABNORMAL LOW
GLUCOSE: 80 mg/dL (ref 70–99)
Potassium: 3.8 mEq/L (ref 3.5–5.3)
Sodium: 145 mEq/L (ref 135–145)

## 2014-09-26 LAB — HEPATIC FUNCTION PANEL
ALT: 13 U/L (ref 0–35)
AST: 19 U/L (ref 0–37)
Albumin: 3.8 g/dL (ref 3.5–5.2)
Alkaline Phosphatase: 100 U/L (ref 39–117)
BILIRUBIN TOTAL: 0.4 mg/dL (ref 0.2–1.2)
Bilirubin, Direct: 0.1 mg/dL (ref 0.0–0.3)
Indirect Bilirubin: 0.3 mg/dL (ref 0.2–1.2)
Total Protein: 6.8 g/dL (ref 6.0–8.3)

## 2014-09-26 LAB — CBC WITH DIFFERENTIAL/PLATELET
BASOS ABS: 0.1 10*3/uL (ref 0.0–0.1)
Basophils Relative: 1 % (ref 0–1)
Eosinophils Absolute: 0.3 10*3/uL (ref 0.0–0.7)
Eosinophils Relative: 5 % (ref 0–5)
HEMATOCRIT: 41.5 % (ref 36.0–46.0)
Hemoglobin: 13.5 g/dL (ref 12.0–15.0)
LYMPHS ABS: 1.2 10*3/uL (ref 0.7–4.0)
Lymphocytes Relative: 24 % (ref 12–46)
MCH: 29.5 pg (ref 26.0–34.0)
MCHC: 32.5 g/dL (ref 30.0–36.0)
MCV: 90.6 fL (ref 78.0–100.0)
MONO ABS: 0.5 10*3/uL (ref 0.1–1.0)
MPV: 10.3 fL (ref 8.6–12.4)
Monocytes Relative: 10 % (ref 3–12)
NEUTROS ABS: 3.1 10*3/uL (ref 1.7–7.7)
Neutrophils Relative %: 60 % (ref 43–77)
PLATELETS: 211 10*3/uL (ref 150–400)
RBC: 4.58 MIL/uL (ref 3.87–5.11)
RDW: 14.1 % (ref 11.5–15.5)
WBC: 5.2 10*3/uL (ref 4.0–10.5)

## 2014-09-26 LAB — TSH: TSH: 1.736 u[IU]/mL (ref 0.350–4.500)

## 2014-09-26 LAB — MAGNESIUM: Magnesium: 1.6 mg/dL (ref 1.5–2.5)

## 2014-09-26 LAB — VITAMIN D 25 HYDROXY (VIT D DEFICIENCY, FRACTURES): Vit D, 25-Hydroxy: 50 ng/mL (ref 30–100)

## 2014-09-26 LAB — MICROALBUMIN / CREATININE URINE RATIO
Creatinine, Urine: 172.2 mg/dL
Microalb Creat Ratio: 5.8 mg/g (ref 0.0–30.0)
Microalb, Ur: 1 mg/dL (ref <2.0)

## 2014-09-26 LAB — INSULIN, FASTING: INSULIN FASTING, SERUM: 17.3 u[IU]/mL (ref 2.0–19.6)

## 2014-09-30 ENCOUNTER — Ambulatory Visit (INDEPENDENT_AMBULATORY_CARE_PROVIDER_SITE_OTHER): Payer: Medicare Other | Admitting: Cardiology

## 2014-09-30 DIAGNOSIS — I129 Hypertensive chronic kidney disease with stage 1 through stage 4 chronic kidney disease, or unspecified chronic kidney disease: Secondary | ICD-10-CM | POA: Diagnosis not present

## 2014-09-30 DIAGNOSIS — I4891 Unspecified atrial fibrillation: Secondary | ICD-10-CM | POA: Diagnosis not present

## 2014-09-30 DIAGNOSIS — T84012D Broken internal right knee prosthesis, subsequent encounter: Secondary | ICD-10-CM | POA: Diagnosis not present

## 2014-09-30 DIAGNOSIS — Z4889 Encounter for other specified surgical aftercare: Secondary | ICD-10-CM | POA: Diagnosis not present

## 2014-09-30 DIAGNOSIS — Z950 Presence of cardiac pacemaker: Secondary | ICD-10-CM | POA: Diagnosis not present

## 2014-09-30 DIAGNOSIS — N189 Chronic kidney disease, unspecified: Secondary | ICD-10-CM | POA: Diagnosis not present

## 2014-09-30 DIAGNOSIS — I2510 Atherosclerotic heart disease of native coronary artery without angina pectoris: Secondary | ICD-10-CM | POA: Diagnosis not present

## 2014-09-30 DIAGNOSIS — Z5181 Encounter for therapeutic drug level monitoring: Secondary | ICD-10-CM | POA: Diagnosis not present

## 2014-09-30 LAB — POCT INR: INR: 2.5

## 2014-10-03 ENCOUNTER — Other Ambulatory Visit: Payer: Self-pay | Admitting: *Deleted

## 2014-10-03 DIAGNOSIS — Z4889 Encounter for other specified surgical aftercare: Secondary | ICD-10-CM | POA: Diagnosis not present

## 2014-10-03 DIAGNOSIS — I4891 Unspecified atrial fibrillation: Secondary | ICD-10-CM | POA: Diagnosis not present

## 2014-10-03 DIAGNOSIS — Z5181 Encounter for therapeutic drug level monitoring: Secondary | ICD-10-CM | POA: Diagnosis not present

## 2014-10-03 DIAGNOSIS — I129 Hypertensive chronic kidney disease with stage 1 through stage 4 chronic kidney disease, or unspecified chronic kidney disease: Secondary | ICD-10-CM | POA: Diagnosis not present

## 2014-10-03 DIAGNOSIS — N189 Chronic kidney disease, unspecified: Secondary | ICD-10-CM | POA: Diagnosis not present

## 2014-10-03 DIAGNOSIS — I2510 Atherosclerotic heart disease of native coronary artery without angina pectoris: Secondary | ICD-10-CM | POA: Diagnosis not present

## 2014-10-03 DIAGNOSIS — Z950 Presence of cardiac pacemaker: Secondary | ICD-10-CM | POA: Diagnosis not present

## 2014-10-03 DIAGNOSIS — T84012D Broken internal right knee prosthesis, subsequent encounter: Secondary | ICD-10-CM | POA: Diagnosis not present

## 2014-10-03 MED ORDER — GABAPENTIN 100 MG PO CAPS: 100.0000 mg | ORAL_CAPSULE | Freq: Three times a day (TID) | ORAL | Status: DC

## 2014-10-06 ENCOUNTER — Other Ambulatory Visit: Payer: Self-pay | Admitting: Internal Medicine

## 2014-10-07 DIAGNOSIS — I129 Hypertensive chronic kidney disease with stage 1 through stage 4 chronic kidney disease, or unspecified chronic kidney disease: Secondary | ICD-10-CM | POA: Diagnosis not present

## 2014-10-07 DIAGNOSIS — T84012D Broken internal right knee prosthesis, subsequent encounter: Secondary | ICD-10-CM | POA: Diagnosis not present

## 2014-10-07 DIAGNOSIS — Z5181 Encounter for therapeutic drug level monitoring: Secondary | ICD-10-CM | POA: Diagnosis not present

## 2014-10-07 DIAGNOSIS — Z4889 Encounter for other specified surgical aftercare: Secondary | ICD-10-CM | POA: Diagnosis not present

## 2014-10-07 DIAGNOSIS — I4891 Unspecified atrial fibrillation: Secondary | ICD-10-CM | POA: Diagnosis not present

## 2014-10-07 DIAGNOSIS — N189 Chronic kidney disease, unspecified: Secondary | ICD-10-CM | POA: Diagnosis not present

## 2014-10-07 DIAGNOSIS — I2510 Atherosclerotic heart disease of native coronary artery without angina pectoris: Secondary | ICD-10-CM | POA: Diagnosis not present

## 2014-10-07 DIAGNOSIS — Z950 Presence of cardiac pacemaker: Secondary | ICD-10-CM | POA: Diagnosis not present

## 2014-10-10 DIAGNOSIS — T84012D Broken internal right knee prosthesis, subsequent encounter: Secondary | ICD-10-CM | POA: Diagnosis not present

## 2014-10-10 DIAGNOSIS — I129 Hypertensive chronic kidney disease with stage 1 through stage 4 chronic kidney disease, or unspecified chronic kidney disease: Secondary | ICD-10-CM | POA: Diagnosis not present

## 2014-10-10 DIAGNOSIS — I4891 Unspecified atrial fibrillation: Secondary | ICD-10-CM | POA: Diagnosis not present

## 2014-10-10 DIAGNOSIS — I2510 Atherosclerotic heart disease of native coronary artery without angina pectoris: Secondary | ICD-10-CM | POA: Diagnosis not present

## 2014-10-10 DIAGNOSIS — Z5181 Encounter for therapeutic drug level monitoring: Secondary | ICD-10-CM | POA: Diagnosis not present

## 2014-10-10 DIAGNOSIS — Z4889 Encounter for other specified surgical aftercare: Secondary | ICD-10-CM | POA: Diagnosis not present

## 2014-10-10 DIAGNOSIS — N189 Chronic kidney disease, unspecified: Secondary | ICD-10-CM | POA: Diagnosis not present

## 2014-10-10 DIAGNOSIS — Z950 Presence of cardiac pacemaker: Secondary | ICD-10-CM | POA: Diagnosis not present

## 2014-10-15 DIAGNOSIS — Z4889 Encounter for other specified surgical aftercare: Secondary | ICD-10-CM | POA: Diagnosis not present

## 2014-10-15 DIAGNOSIS — I4891 Unspecified atrial fibrillation: Secondary | ICD-10-CM | POA: Diagnosis not present

## 2014-10-15 DIAGNOSIS — Z950 Presence of cardiac pacemaker: Secondary | ICD-10-CM | POA: Diagnosis not present

## 2014-10-15 DIAGNOSIS — T84012D Broken internal right knee prosthesis, subsequent encounter: Secondary | ICD-10-CM | POA: Diagnosis not present

## 2014-10-15 DIAGNOSIS — I2510 Atherosclerotic heart disease of native coronary artery without angina pectoris: Secondary | ICD-10-CM | POA: Diagnosis not present

## 2014-10-15 DIAGNOSIS — Z5181 Encounter for therapeutic drug level monitoring: Secondary | ICD-10-CM | POA: Diagnosis not present

## 2014-10-15 DIAGNOSIS — N189 Chronic kidney disease, unspecified: Secondary | ICD-10-CM | POA: Diagnosis not present

## 2014-10-15 DIAGNOSIS — I129 Hypertensive chronic kidney disease with stage 1 through stage 4 chronic kidney disease, or unspecified chronic kidney disease: Secondary | ICD-10-CM | POA: Diagnosis not present

## 2014-10-17 DIAGNOSIS — I4891 Unspecified atrial fibrillation: Secondary | ICD-10-CM | POA: Diagnosis not present

## 2014-10-17 DIAGNOSIS — I2510 Atherosclerotic heart disease of native coronary artery without angina pectoris: Secondary | ICD-10-CM | POA: Diagnosis not present

## 2014-10-17 DIAGNOSIS — Z950 Presence of cardiac pacemaker: Secondary | ICD-10-CM | POA: Diagnosis not present

## 2014-10-17 DIAGNOSIS — Z4889 Encounter for other specified surgical aftercare: Secondary | ICD-10-CM | POA: Diagnosis not present

## 2014-10-17 DIAGNOSIS — I129 Hypertensive chronic kidney disease with stage 1 through stage 4 chronic kidney disease, or unspecified chronic kidney disease: Secondary | ICD-10-CM | POA: Diagnosis not present

## 2014-10-17 DIAGNOSIS — N189 Chronic kidney disease, unspecified: Secondary | ICD-10-CM | POA: Diagnosis not present

## 2014-10-17 DIAGNOSIS — T84012D Broken internal right knee prosthesis, subsequent encounter: Secondary | ICD-10-CM | POA: Diagnosis not present

## 2014-10-17 DIAGNOSIS — Z5181 Encounter for therapeutic drug level monitoring: Secondary | ICD-10-CM | POA: Diagnosis not present

## 2014-10-22 DIAGNOSIS — N189 Chronic kidney disease, unspecified: Secondary | ICD-10-CM | POA: Diagnosis not present

## 2014-10-22 DIAGNOSIS — Z950 Presence of cardiac pacemaker: Secondary | ICD-10-CM | POA: Diagnosis not present

## 2014-10-22 DIAGNOSIS — I129 Hypertensive chronic kidney disease with stage 1 through stage 4 chronic kidney disease, or unspecified chronic kidney disease: Secondary | ICD-10-CM | POA: Diagnosis not present

## 2014-10-22 DIAGNOSIS — I4891 Unspecified atrial fibrillation: Secondary | ICD-10-CM | POA: Diagnosis not present

## 2014-10-22 DIAGNOSIS — Z5181 Encounter for therapeutic drug level monitoring: Secondary | ICD-10-CM | POA: Diagnosis not present

## 2014-10-22 DIAGNOSIS — Z4889 Encounter for other specified surgical aftercare: Secondary | ICD-10-CM | POA: Diagnosis not present

## 2014-10-22 DIAGNOSIS — I2510 Atherosclerotic heart disease of native coronary artery without angina pectoris: Secondary | ICD-10-CM | POA: Diagnosis not present

## 2014-10-22 DIAGNOSIS — T84012D Broken internal right knee prosthesis, subsequent encounter: Secondary | ICD-10-CM | POA: Diagnosis not present

## 2014-10-24 DIAGNOSIS — I129 Hypertensive chronic kidney disease with stage 1 through stage 4 chronic kidney disease, or unspecified chronic kidney disease: Secondary | ICD-10-CM | POA: Diagnosis not present

## 2014-10-24 DIAGNOSIS — Z950 Presence of cardiac pacemaker: Secondary | ICD-10-CM | POA: Diagnosis not present

## 2014-10-24 DIAGNOSIS — I4891 Unspecified atrial fibrillation: Secondary | ICD-10-CM | POA: Diagnosis not present

## 2014-10-24 DIAGNOSIS — S86891D Other injury of other muscle(s) and tendon(s) at lower leg level, right leg, subsequent encounter: Secondary | ICD-10-CM | POA: Diagnosis not present

## 2014-10-24 DIAGNOSIS — Z4789 Encounter for other orthopedic aftercare: Secondary | ICD-10-CM | POA: Diagnosis not present

## 2014-10-24 DIAGNOSIS — T84012D Broken internal right knee prosthesis, subsequent encounter: Secondary | ICD-10-CM | POA: Diagnosis not present

## 2014-10-24 DIAGNOSIS — Z5181 Encounter for therapeutic drug level monitoring: Secondary | ICD-10-CM | POA: Diagnosis not present

## 2014-10-24 DIAGNOSIS — I2510 Atherosclerotic heart disease of native coronary artery without angina pectoris: Secondary | ICD-10-CM | POA: Diagnosis not present

## 2014-10-24 DIAGNOSIS — N189 Chronic kidney disease, unspecified: Secondary | ICD-10-CM | POA: Diagnosis not present

## 2014-10-24 DIAGNOSIS — Z4889 Encounter for other specified surgical aftercare: Secondary | ICD-10-CM | POA: Diagnosis not present

## 2014-10-25 DIAGNOSIS — M6250 Muscle wasting and atrophy, not elsewhere classified, unspecified site: Secondary | ICD-10-CM | POA: Diagnosis not present

## 2014-10-26 ENCOUNTER — Encounter: Payer: Self-pay | Admitting: *Deleted

## 2014-10-28 ENCOUNTER — Telehealth: Payer: Self-pay | Admitting: *Deleted

## 2014-10-28 ENCOUNTER — Other Ambulatory Visit: Payer: Self-pay | Admitting: Internal Medicine

## 2014-10-28 NOTE — Telephone Encounter (Signed)
Daughter called and states Gabapentin is causing the patient to itch and they want to stop Gabapentin and refill her Lyrica.  Per Dr Melford Aase, he doubts the Gabapentin is causing but OK to retry Lyrica.  Daughter aware.

## 2014-10-29 DIAGNOSIS — I129 Hypertensive chronic kidney disease with stage 1 through stage 4 chronic kidney disease, or unspecified chronic kidney disease: Secondary | ICD-10-CM | POA: Diagnosis not present

## 2014-10-29 DIAGNOSIS — Z5181 Encounter for therapeutic drug level monitoring: Secondary | ICD-10-CM | POA: Diagnosis not present

## 2014-10-29 DIAGNOSIS — N189 Chronic kidney disease, unspecified: Secondary | ICD-10-CM | POA: Diagnosis not present

## 2014-10-29 DIAGNOSIS — Z4889 Encounter for other specified surgical aftercare: Secondary | ICD-10-CM | POA: Diagnosis not present

## 2014-10-29 DIAGNOSIS — Z950 Presence of cardiac pacemaker: Secondary | ICD-10-CM | POA: Diagnosis not present

## 2014-10-29 DIAGNOSIS — T84012D Broken internal right knee prosthesis, subsequent encounter: Secondary | ICD-10-CM | POA: Diagnosis not present

## 2014-10-29 DIAGNOSIS — I4891 Unspecified atrial fibrillation: Secondary | ICD-10-CM | POA: Diagnosis not present

## 2014-10-29 DIAGNOSIS — I2510 Atherosclerotic heart disease of native coronary artery without angina pectoris: Secondary | ICD-10-CM | POA: Diagnosis not present

## 2014-10-30 ENCOUNTER — Ambulatory Visit (INDEPENDENT_AMBULATORY_CARE_PROVIDER_SITE_OTHER): Payer: Medicare Other | Admitting: Pharmacist

## 2014-10-30 DIAGNOSIS — Z950 Presence of cardiac pacemaker: Secondary | ICD-10-CM | POA: Diagnosis not present

## 2014-10-30 DIAGNOSIS — T84012D Broken internal right knee prosthesis, subsequent encounter: Secondary | ICD-10-CM | POA: Diagnosis not present

## 2014-10-30 DIAGNOSIS — Z4889 Encounter for other specified surgical aftercare: Secondary | ICD-10-CM | POA: Diagnosis not present

## 2014-10-30 DIAGNOSIS — I129 Hypertensive chronic kidney disease with stage 1 through stage 4 chronic kidney disease, or unspecified chronic kidney disease: Secondary | ICD-10-CM | POA: Diagnosis not present

## 2014-10-30 DIAGNOSIS — I2510 Atherosclerotic heart disease of native coronary artery without angina pectoris: Secondary | ICD-10-CM | POA: Diagnosis not present

## 2014-10-30 DIAGNOSIS — N189 Chronic kidney disease, unspecified: Secondary | ICD-10-CM | POA: Diagnosis not present

## 2014-10-30 DIAGNOSIS — I4891 Unspecified atrial fibrillation: Secondary | ICD-10-CM | POA: Diagnosis not present

## 2014-10-30 DIAGNOSIS — Z5181 Encounter for therapeutic drug level monitoring: Secondary | ICD-10-CM | POA: Diagnosis not present

## 2014-10-30 LAB — POCT INR: INR: 2.3

## 2014-10-31 DIAGNOSIS — I2510 Atherosclerotic heart disease of native coronary artery without angina pectoris: Secondary | ICD-10-CM | POA: Diagnosis not present

## 2014-10-31 DIAGNOSIS — I129 Hypertensive chronic kidney disease with stage 1 through stage 4 chronic kidney disease, or unspecified chronic kidney disease: Secondary | ICD-10-CM | POA: Diagnosis not present

## 2014-10-31 DIAGNOSIS — N189 Chronic kidney disease, unspecified: Secondary | ICD-10-CM | POA: Diagnosis not present

## 2014-10-31 DIAGNOSIS — Z950 Presence of cardiac pacemaker: Secondary | ICD-10-CM | POA: Diagnosis not present

## 2014-10-31 DIAGNOSIS — T84012D Broken internal right knee prosthesis, subsequent encounter: Secondary | ICD-10-CM | POA: Diagnosis not present

## 2014-10-31 DIAGNOSIS — Z4889 Encounter for other specified surgical aftercare: Secondary | ICD-10-CM | POA: Diagnosis not present

## 2014-10-31 DIAGNOSIS — I4891 Unspecified atrial fibrillation: Secondary | ICD-10-CM | POA: Diagnosis not present

## 2014-10-31 DIAGNOSIS — Z5181 Encounter for therapeutic drug level monitoring: Secondary | ICD-10-CM | POA: Diagnosis not present

## 2014-11-11 ENCOUNTER — Ambulatory Visit (INDEPENDENT_AMBULATORY_CARE_PROVIDER_SITE_OTHER): Payer: Medicare Other | Admitting: *Deleted

## 2014-11-11 ENCOUNTER — Encounter: Payer: Self-pay | Admitting: Internal Medicine

## 2014-11-11 ENCOUNTER — Telehealth: Payer: Self-pay | Admitting: Cardiology

## 2014-11-11 DIAGNOSIS — I495 Sick sinus syndrome: Secondary | ICD-10-CM | POA: Diagnosis not present

## 2014-11-11 LAB — MDC_IDC_ENUM_SESS_TYPE_REMOTE
Battery Impedance: 318 Ohm
Battery Remaining Longevity: 105 mo
Battery Voltage: 2.81 V
Brady Statistic AS VS Percent: 5 %
Lead Channel Pacing Threshold Pulse Width: 0.4 ms
Lead Channel Pacing Threshold Pulse Width: 0.4 ms
Lead Channel Sensing Intrinsic Amplitude: 5.6 mV
Lead Channel Setting Pacing Amplitude: 2.5 V
Lead Channel Setting Pacing Pulse Width: 0.4 ms
Lead Channel Setting Sensing Sensitivity: 2 mV
MDC IDC MSMT LEADCHNL RA IMPEDANCE VALUE: 498 Ohm
MDC IDC MSMT LEADCHNL RA PACING THRESHOLD AMPLITUDE: 0.375 V
MDC IDC MSMT LEADCHNL RV IMPEDANCE VALUE: 436 Ohm
MDC IDC MSMT LEADCHNL RV PACING THRESHOLD AMPLITUDE: 0.875 V
MDC IDC SESS DTM: 20160307195736
MDC IDC SET LEADCHNL RA PACING AMPLITUDE: 2 V
MDC IDC STAT BRADY AP VP PERCENT: 0 %
MDC IDC STAT BRADY AP VS PERCENT: 95 %
MDC IDC STAT BRADY AS VP PERCENT: 0 %

## 2014-11-11 NOTE — Progress Notes (Signed)
Remote pacemaker transmission.   

## 2014-11-11 NOTE — Telephone Encounter (Signed)
Confirmed remote transmission w/ pt daughter.   

## 2014-11-18 ENCOUNTER — Encounter: Payer: Self-pay | Admitting: Cardiology

## 2014-11-23 DIAGNOSIS — M6250 Muscle wasting and atrophy, not elsewhere classified, unspecified site: Secondary | ICD-10-CM | POA: Diagnosis not present

## 2014-11-25 ENCOUNTER — Other Ambulatory Visit: Payer: Self-pay | Admitting: Internal Medicine

## 2014-11-25 DIAGNOSIS — G894 Chronic pain syndrome: Secondary | ICD-10-CM

## 2014-11-26 DIAGNOSIS — M6250 Muscle wasting and atrophy, not elsewhere classified, unspecified site: Secondary | ICD-10-CM | POA: Diagnosis not present

## 2014-11-27 ENCOUNTER — Ambulatory Visit (INDEPENDENT_AMBULATORY_CARE_PROVIDER_SITE_OTHER): Payer: Medicare Other | Admitting: *Deleted

## 2014-11-27 DIAGNOSIS — I4891 Unspecified atrial fibrillation: Secondary | ICD-10-CM

## 2014-11-27 LAB — POCT INR: INR: 2.6

## 2014-12-05 DIAGNOSIS — M25551 Pain in right hip: Secondary | ICD-10-CM | POA: Diagnosis not present

## 2014-12-10 ENCOUNTER — Other Ambulatory Visit: Payer: Self-pay | Admitting: Orthopedic Surgery

## 2014-12-10 ENCOUNTER — Ambulatory Visit
Admission: RE | Admit: 2014-12-10 | Discharge: 2014-12-10 | Disposition: A | Discharge status: Home / Self Care | Payer: Medicare Other | Source: Ambulatory Visit | Attending: Orthopedic Surgery | Admitting: Orthopedic Surgery

## 2014-12-10 DIAGNOSIS — M25551 Pain in right hip: Secondary | ICD-10-CM

## 2014-12-10 DIAGNOSIS — R102 Pelvic and perineal pain: Secondary | ICD-10-CM | POA: Diagnosis not present

## 2014-12-16 DIAGNOSIS — H35033 Hypertensive retinopathy, bilateral: Secondary | ICD-10-CM | POA: Diagnosis not present

## 2014-12-16 DIAGNOSIS — H3412 Central retinal artery occlusion, left eye: Secondary | ICD-10-CM | POA: Diagnosis not present

## 2014-12-30 ENCOUNTER — Other Ambulatory Visit: Payer: Self-pay | Admitting: Internal Medicine

## 2015-01-06 ENCOUNTER — Ambulatory Visit: Payer: Self-pay | Admitting: Physician Assistant

## 2015-01-08 ENCOUNTER — Ambulatory Visit (INDEPENDENT_AMBULATORY_CARE_PROVIDER_SITE_OTHER): Payer: Medicare Other | Admitting: *Deleted

## 2015-01-08 DIAGNOSIS — I4891 Unspecified atrial fibrillation: Secondary | ICD-10-CM

## 2015-01-08 LAB — POCT INR: INR: 2.3

## 2015-01-14 ENCOUNTER — Encounter: Payer: Self-pay | Admitting: Cardiology

## 2015-01-14 ENCOUNTER — Ambulatory Visit (INDEPENDENT_AMBULATORY_CARE_PROVIDER_SITE_OTHER): Payer: Medicare Other | Admitting: Cardiology

## 2015-01-14 VITALS — BP 140/68 | HR 70 | Ht 61.0 in | Wt 152.0 lb

## 2015-01-14 DIAGNOSIS — I10 Essential (primary) hypertension: Secondary | ICD-10-CM

## 2015-01-14 DIAGNOSIS — I4891 Unspecified atrial fibrillation: Secondary | ICD-10-CM | POA: Diagnosis not present

## 2015-01-14 MED ORDER — ATENOLOL 50 MG PO TABS: ORAL_TABLET | ORAL | Status: DC

## 2015-01-14 NOTE — Progress Notes (Signed)
Cardiology Office Note   Date:  01/14/2015   ID:  [name], DOB [birth year], MRN [id number]  PCP:  [name], MD  Cardiologist: Darlin Coco MD  No chief complaint on file.     History of Present Illness: [name] is a 81 y.o. female who presents for a four-month office visit This pleasant 81 year old woman is seen for a scheduled followup office visit. She has a past history of tachybradycardia syndrome . She has a permanent pacemaker implanted 07/01/10 . She has a remote history of repair of a patent ductus arteriosus. She has a history of moderate valvular aortic stenosis. She has had a history of congestive heart failure. Most recent admission was 08/25/12-08/29/12 for exacerbation of CHF. She was hospitalized again in November 2015 for congestive heart failure and pneumonia. Her last echocardiogram 08/26/12 showed ejection fraction of 60-65% with grade 2 diastolic dysfunction and a peak aortic valve gradient of 29 with a mean aortic valve gradient of 14. Her more recent echocardiogram in September 2015 showed ejection fraction of 55-60%. The aortic valve showed mild stenosis and moderate regurgitation. She is still wearing a brace on her right knee. She has had a total of 3 operations on her right leg. Dr. Alvan Dame is her orthopedist. 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 She had carotid Dopplers which did not show any significant obstruction.   Past Medical History  Diagnosis Date  . PAF (paroxysmal atrial fibrillation)     controlled with amiodarone, on coumadin  . CAD (coronary artery disease)   . Aortic root dilatation   . Moderate aortic stenosis   . Chronic anticoagulation   . Tachycardia-bradycardia syndrome     s/p PPM by Dr Doreatha Lew (MDT) 07/03/10  . GERD (gastroesophageal reflux disease)   . Pacemaker     2012  . Neuropathy     feet/legs  . Restless  leg syndrome   . Headache(784.0)     hx migraines - takes Zoloft for migraines  . History of uterine cancer 1980s    treated with hysterectomy, external radiation and radiation seed implants.   . Cancer     hx Kidney cancer / hx of endometrial cancer  . PONV (postoperative nausea and vomiting)   . Anxiety   . Heart murmur   . HTN (hypertension)   . Hyperlipemia   . Hypothyroidism   . Osteomyelitis as child  . Family history of anesthesia complication     daughter has ponv  . Syncope 05/28/2014  . Hepatic steatosis   . Elevated LFTs   . Gastroparesis   . Anal fissure   . Radiation proctitis   . Gastric polyp     adenomatous  . Neuromuscular disorder     neuropathy in right leg  . Chronic kidney disease     only has one kidney   . 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 Cardiac catheterization  2008  . Nephrectomy Left     renal cell cancer  . Total abdominal hysterectomy    . Patent ductus arterious repair  age 57  . Ptvp    . Pacemaker insertion  07/04/11    by Dr Doreatha Lew  . Cholecystectomy  1970s  . Upper gastrointestinal endoscopy    . Total knee arthroplasty  02/07/2012    Procedure: TOTAL KNEE ARTHROPLASTY;  Surgeon:  Mauri Pole, MD;  Location: [facility];  Service: Orthopedics;  Laterality: Right;  . Insert / replace / remove pacemaker    . Joint replacement      rt total hip 2009  . I&d knee with poly exchange Right 12/17/2013    Procedure: IRRIGATION AND DEBRIDEMEN RIGHT TOTAL  KNEE WITH POLY EXCHANGE;  Surgeon: Mauri Pole, MD;  Location: [facility];  Service: Orthopedics;  Laterality: Right;  . Patellar tendon repair Right 03/06/2014    Procedure: AVULSION WITH PATELLA TENDON REPAIR;  Surgeon: Mauri Pole, MD;  Location: [facility];  Service: Orthopedics;  Laterality: Right;  . Total knee revision Right 07/29/2014    Procedure: RIGHT KNEE REVISION OF PREVIOUS REPAIR EXTENSOR MECHANISM;  Surgeon: Mauri Pole, MD;   Location: [facility];  Service: Orthopedics;  Laterality: Right;     Current Outpatient Prescriptions  Medication Sig Dispense Refill  . ALPRAZolam (XANAX) 0.5 MG tablet Take 0.25 mg by mouth 3 (three) times daily as needed for anxiety.     [name] atenolol (TENORMIN) 50 MG tablet TAKE 1 AND 1/2 TABLET BY MOUTH TWICE DAILY 270 tablet 3  . Biotin 5000 MCG CAPS Take 10,000 mcg by mouth every morning.     . Cholecalciferol (VITAMIN D PO) Take 5,000 Units by mouth every morning.     . furosemide (LASIX) 20 MG tablet Take 20 mg by mouth daily.    [name] levothyroxine (SYNTHROID, LEVOTHROID) 50 MCG tablet Take 50 mcg by mouth daily before breakfast.    . losartan (COZAAR) 100 MG tablet Take 100 mg by mouth daily.    [name] LYRICA 75 MG capsule TAKE ONE CAPSULE TWICE A DAY 180 capsule 1  . methocarbamol (ROBAXIN) 500 MG tablet Take 1 tablet (500 mg total) by mouth every 6 (six) hours as needed for muscle spasms. 50 tablet 0  . nitroGLYCERIN (NITROSTAT) 0.4 MG SL tablet Place 0.4 mg under the tongue every 5 (five) minutes as needed for chest pain.    . potassium chloride SA (K-DUR,KLOR-CON) 20 MEQ tablet Take 20 mEq by mouth 2 (two) times daily.    . ranitidine (ZANTAC) 150 MG tablet Take 150 mg by mouth 2 (two) times daily.     . sertraline (ZOLOFT) 50 MG tablet Take 50 mg by mouth every evening.     . traMADol (ULTRAM) 50 MG tablet Take 1-2 tablets (50-100 mg total) by mouth every 6 (six) hours as needed. (Patient taking differently: Take 25 mg by mouth every 6 (six) hours as needed for moderate pain or severe pain. ) 100 tablet 0  . triamcinolone cream (KENALOG) 0.1 % Apply 1 application topically as needed.   3  . warfarin (COUMADIN) 2 MG tablet TAKE 1/2 TO 1 AND 1/2 TABLET AS DIRECTED 50 tablet 3  . meclizine (ANTIVERT) 25 MG tablet Take 25 mg by mouth as needed for dizziness or nausea.     No current facility-administered medications for this visit.    Allergies:   Codeine; Gabapentin; Hydrocodone; Morphine and  related; Requip; and Zinc    Social History:  The patient  reports that she has never smoked. She has never used smokeless tobacco. She reports that she does not drink alcohol or use illicit drugs.   Family History:  The patient's family history includes Colon cancer (age of onset: 79) in her mother; Heart attack in her father; Heart disease in her father, maternal grandmother, and mother.    ROS:  Please see the  history of present illness.   Otherwise, review of systems are positive for none.   All other systems are reviewed and negative.    PHYSICAL EXAM: VS:  BP 140/68 mmHg  Pulse 70  Ht 5\' 1"  (1.549 m)  Wt 152 lb (68.947 kg)  BMI 28.74 kg/m2 , BMI Body mass index is 28.74 kg/(m^2). GEN: Well nourished, well developed, in no acute distress HEENT: normal Neck: no JVD, carotid bruits, or masses Cardiac: RRR; there is a soft systolic ejection murmur at the base Respiratory:  clear to auscultation bilaterally, normal work of breathing GI: soft, nontender, nondistended, + BS MS: no deformity or atrophy Skin: warm and dry, no rash.  There is chronic ecchymosis and pigmentation of the skin around the right knee which is old. Neuro:  Strength and sensation are intact Psych: euthymic mood, full affect   EKG:  EKG is ordered today. The ekg ordered today demonstrates atrial pacemaker.  Nonspecific ST-T wave abnormality.  Long QT interval.  Nonspecific ST-T wave changes.   Recent Labs: 08/02/2014: Pro B Natriuretic peptide (BNP) 11208.0* 09/25/2014: ALT 13; BUN 26*; Creatinine 1.10; Hemoglobin 13.5; Magnesium 1.6; Platelets 211; Potassium 3.8; Sodium 145; TSH 1.736    Lipid Panel    Component Value Date/Time   CHOL 219* 09/25/2014 1146   TRIG 318* 09/25/2014 1146   HDL 36* 09/25/2014 1146   CHOLHDL 6.1 09/25/2014 1146   VLDL 64* 09/25/2014 1146   LDLCALC 119* 09/25/2014 1146      Wt Readings from Last 3 Encounters:  01/14/15 152 lb (68.947 kg)  09/25/14 157 lb (71.215 kg)    09/24/14 156 lb (70.761 kg)        ASSESSMENT AND PLAN:  1. Paroxysmal atrial fibrillation. 2. tachybradycardia syndrome with functioning dual-chamber pacemaker. 3. essential hypertension without heart failure. 4. hypercholesterolemia followed by PCP 5. osteoarthritis of right knee followed by Dr. Alvan Dame 6. mild to moderate aortic stenosis 7. history of left nephrectomy secondary to kidney cancer 8. history of endometrial cancer 9. GERD 10. History of cholesterol embolus to her left eye with central retinal artery occlusion and loss of vision  Plan: Continue same medication. Recheck in 4 months for office visit and EKG   Current medicines are reviewed at length with the patient today.  The patient does not have concerns regarding medicines.  The following changes have been made:  no change  Labs/ tests ordered today include:   Orders Placed This Encounter  Procedures  . EKG 12-Lead    Physician: Continue on same medication.  Continue ambulating with her fourposter cane.  She no longer has to wear a brace on her right knee.  She will be rechecked here in 4 months for office visit.. Lab work is followed closely by her PCP  Signed, Darlin Coco MD 01/14/2015 2:12 PM    [facility], [place], [place]  [address] Phone: [phone]; Fax: [phone]

## 2015-01-14 NOTE — Patient Instructions (Signed)
Medication Instructions:  Your physician recommends that you continue on your current medications as directed. Please refer to the Current Medication list given to you today.  Labwork: NONE  Testing/Procedures: NONE  Follow-Up: Your physician recommends that you schedule a follow-up appointment in: 4 MONT OV  Any Other Special Instructions Will Be Listed Below (If Applicable).

## 2015-01-20 ENCOUNTER — Other Ambulatory Visit: Payer: Self-pay | Admitting: Internal Medicine

## 2015-01-23 DIAGNOSIS — M6250 Muscle wasting and atrophy, not elsewhere classified, unspecified site: Secondary | ICD-10-CM | POA: Diagnosis not present

## 2015-02-05 ENCOUNTER — Ambulatory Visit (INDEPENDENT_AMBULATORY_CARE_PROVIDER_SITE_OTHER): Payer: Medicare Other | Admitting: Internal Medicine

## 2015-02-05 ENCOUNTER — Encounter: Payer: Self-pay | Admitting: Internal Medicine

## 2015-02-05 ENCOUNTER — Ambulatory Visit (INDEPENDENT_AMBULATORY_CARE_PROVIDER_SITE_OTHER): Payer: Medicare Other | Admitting: *Deleted

## 2015-02-05 VITALS — BP 132/70 | HR 78 | Ht 61.0 in | Wt 152.8 lb

## 2015-02-05 DIAGNOSIS — I4891 Unspecified atrial fibrillation: Secondary | ICD-10-CM

## 2015-02-05 DIAGNOSIS — I495 Sick sinus syndrome: Secondary | ICD-10-CM

## 2015-02-05 LAB — CUP PACEART INCLINIC DEVICE CHECK
Battery Impedance: 317 Ohm
Battery Voltage: 2.8 V
Brady Statistic AP VS Percent: 94 %
Brady Statistic AS VP Percent: 0 %
Date Time Interrogation Session: 20160601145923
Lead Channel Impedance Value: 420 Ohm
Lead Channel Impedance Value: 493 Ohm
Lead Channel Pacing Threshold Amplitude: 0.5 V
Lead Channel Pacing Threshold Amplitude: 0.75 V
Lead Channel Pacing Threshold Pulse Width: 0.4 ms
Lead Channel Sensing Intrinsic Amplitude: 2 mV
Lead Channel Setting Pacing Amplitude: 2 V
Lead Channel Setting Pacing Pulse Width: 0.4 ms
Lead Channel Setting Sensing Sensitivity: 2 mV
MDC IDC MSMT BATTERY REMAINING LONGEVITY: 105 mo
MDC IDC MSMT LEADCHNL RV PACING THRESHOLD PULSEWIDTH: 0.4 ms
MDC IDC MSMT LEADCHNL RV SENSING INTR AMPL: 8 mV
MDC IDC SET LEADCHNL RV PACING AMPLITUDE: 2.5 V
MDC IDC STAT BRADY AP VP PERCENT: 0 %
MDC IDC STAT BRADY AS VS PERCENT: 6 %

## 2015-02-05 LAB — POCT INR: INR: 2.3

## 2015-02-05 NOTE — Patient Instructions (Signed)
Medication Instructions:  Your physician recommends that you continue on your current medications as directed. Please refer to the Current Medication list given to you today.   Labwork: None ordered  Testing/Procedures: None ordered  Follow-Up: Remote monitoring is used to monitor your Pacemaker or ICD from home. This monitoring reduces the number of office visits required to check your device to one time per year. It allows [place] to keep an eye on the functioning of your device to ensure it is working properly. You are scheduled for a device check from home on 05/07/15. You may send your transmission at any time that day. If you have a wireless device, the transmission will be sent automatically. After your physician reviews/ your transmission, you will receive a postcard with your next transmission date.  Your physician wants you to follow-up in: 12 months with Chanetta Marshall, NP You will receive a reminder letter in the mail two months in advance. If you don't receive a letter, please call our office to schedule the follow-up appointment.     Any Other Special Instructions Will Be Listed Below (If Applicable).

## 2015-02-05 NOTE — Progress Notes (Signed)
Primary Cardiologist:  Dr Mare Ferrari  [name] has a h/o atrial fibrillation and tachycardia/ bradycardia syndrome sp PPM (MDT) by Dr Doreatha Lew.  She presents today for follow-up in the Electrophysiology device clinic.  She is doing reasonably well.  Primary concern is with visual changes as arthritis.   Today, she  denies symptoms of palpitations, chest pain, shortness of breath, orthopnea, PND, lower extremity edema, presyncope, syncope, or neurologic sequela.  The patientis tolerating medications without difficulties and is otherwise without complaint today.   Past Medical History  Diagnosis Date  . PAF (paroxysmal atrial fibrillation)     controlled with amiodarone, on coumadin  . CAD (coronary artery disease)   . Aortic root dilatation   . Moderate aortic stenosis   . Chronic anticoagulation   . Tachycardia-bradycardia syndrome     s/p PPM by Dr Doreatha Lew (MDT) 07/03/10  . GERD (gastroesophageal reflux disease)   . Pacemaker     2012  . Neuropathy     feet/legs  . Restless leg syndrome   . Headache(784.0)     hx migraines - takes Zoloft for migraines  . History of uterine cancer 1980s    treated with hysterectomy, external radiation and radiation seed implants.   . Cancer     hx Kidney cancer / hx of endometrial cancer  . PONV (postoperative nausea and vomiting)   . Anxiety   . Heart murmur   . HTN (hypertension)   . Hyperlipemia   . Hypothyroidism   . Osteomyelitis as child  . Family history of anesthesia complication     daughter has ponv  . Syncope 05/28/2014  . Hepatic steatosis   . Elevated LFTs   . Gastroparesis   . Anal fissure   . Radiation proctitis   . Gastric polyp     adenomatous  . Neuromuscular disorder     neuropathy in right leg  . Chronic kidney disease     only has one kidney   . 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 Cardiac catheterization  2008  . Nephrectomy Left     renal cell cancer  . Total  abdominal hysterectomy    . Patent ductus arterious repair  age 49  . Ptvp    . Pacemaker insertion  07/04/11    by Dr Doreatha Lew  . Cholecystectomy  1970s  . Upper gastrointestinal endoscopy    . Total knee arthroplasty  02/07/2012    Procedure: TOTAL KNEE ARTHROPLASTY;  Surgeon: Mauri Pole, MD;  Location: [facility];  Service: Orthopedics;  Laterality: Right;  . Insert / replace / remove pacemaker    . Joint replacement      rt total hip 2009  . I&d knee with poly exchange Right 12/17/2013    Procedure: IRRIGATION AND DEBRIDEMEN RIGHT TOTAL  KNEE WITH POLY EXCHANGE;  Surgeon: Mauri Pole, MD;  Location: [facility];  Service: Orthopedics;  Laterality: Right;  . Patellar tendon repair Right 03/06/2014    Procedure: AVULSION WITH PATELLA TENDON REPAIR;  Surgeon: Mauri Pole, MD;  Location: [facility];  Service: Orthopedics;  Laterality: Right;  . Total knee revision Right 07/29/2014    Procedure: RIGHT KNEE REVISION OF PREVIOUS REPAIR EXTENSOR MECHANISM;  Surgeon: Mauri Pole, MD;  Location: [facility];  Service: Orthopedics;  Laterality: Right;    History   Social History  . Marital Status: Married    Spouse Name: N/A  . Number of Children:  N/A  . Years of Education: N/A   Occupational History  . Not on file.   Social History Main Topics  . Smoking status: Never Smoker   . Smokeless tobacco: Never Used  . Alcohol Use: No  . Drug Use: No  . Sexual Activity: No   Other Topics Concern  . Not on file   Social History Narrative   Lives in [place].  Continues to work.    Family History  Problem Relation Age of Onset  . Colon cancer Mother 80  . Heart disease Mother   . Heart disease Father   . Heart attack Father   . Heart disease Maternal Grandmother     Allergies  Allergen Reactions  . Codeine Nausea And Vomiting  . Gabapentin Itching  . Hydrocodone Other (See Comments)    hallucination  . Morphine And Related Nausea And Vomiting  . Requip [Ropinirole Hcl]      Headache   . Zinc     nausea     Current outpatient prescriptions:  .  ALPRAZolam (XANAX) 0.5 MG tablet, Take 0.25 mg by mouth 3 (three) times daily as needed for anxiety. , Disp: , Rfl:  .  atenolol (TENORMIN) 100 MG tablet, Take 1/2 tablet by mouth twice daily (takes along with 1/2 of a 50 mg tablet to equal 75 mg twice daily), Disp: , Rfl:  .  atenolol (TENORMIN) 50 MG tablet, Take 1/2 tablet by mouth twice daily (takes along with 1/2 of a 100 mg tablet to equal 75 mg twice daily), Disp: , Rfl:  .  Biotin 5000 MCG CAPS, Take 10,000 mcg by mouth every morning. , Disp: , Rfl:  .  Cholecalciferol (VITAMIN D PO), Take 5,000 Units by mouth every morning. , Disp: , Rfl:  .  furosemide (LASIX) 20 MG tablet, Take 10 mg by mouth every morning. , Disp: , Rfl:  .  levothyroxine (SYNTHROID, LEVOTHROID) 50 MCG tablet, Take 50 mcg by mouth daily before breakfast., Disp: , Rfl:  .  losartan (COZAAR) 100 MG tablet, Take 100 mg by mouth daily., Disp: , Rfl:  .  LYRICA 75 MG capsule, TAKE ONE CAPSULE TWICE A DAY (Patient taking differently: TAKE ONE CAPSULE BY MOUTH TWICE A DAY), Disp: 180 capsule, Rfl: 1 .  meclizine (ANTIVERT) 25 MG tablet, Take 25 mg by mouth daily as needed for dizziness or nausea. , Disp: , Rfl:  .  methocarbamol (ROBAXIN) 500 MG tablet, Take 1 tablet (500 mg total) by mouth every 6 (six) hours as needed for muscle spasms., Disp: 50 tablet, Rfl: 0 .  nitroGLYCERIN (NITROSTAT) 0.4 MG SL tablet, Place 0.4 mg under the tongue every 5 (five) minutes as needed for chest pain (MAX 3 TABLETS). , Disp: , Rfl:  .  potassium chloride SA (K-DUR,KLOR-CON) 20 MEQ tablet, Take 20 mEq by mouth 2 (two) times daily., Disp: , Rfl:  .  ranitidine (ZANTAC) 150 MG tablet, Take 150 mg by mouth 2 (two) times daily. , Disp: , Rfl:  .  sertraline (ZOLOFT) 50 MG tablet, Take 50 mg by mouth every evening. , Disp: , Rfl:  .  traMADol (ULTRAM) 50 MG tablet, Take 1-2 tablets (50-100 mg total) by mouth every 6 (six)  hours as needed. (Patient taking differently: Take 25 mg by mouth every 6 (six) hours as needed for moderate pain or severe pain. ), Disp: 100 tablet, Rfl: 0 .  triamcinolone cream (KENALOG) 0.1 %, Apply 1 application topically daily as needed (itching). ,  Disp: , Rfl: 3 .  warfarin (COUMADIN) 2 MG tablet, Take as directed by the coumadin clinic, Disp: , Rfl:   Physical Exam: Filed Vitals:   02/05/15 1150  BP: 132/70  Pulse: 78  Height: 5\' 1"  (1.549 m)  Weight: 69.31 kg (152 lb 12.8 oz)    GEN- The patient is well appearing, alert and oriented x 3 today.   Head- normocephalic, atraumatic Eyes-  Sclera clear, conjunctiva pink Ears- hearing intact Oropharynx- clear Neck- supple,   Lungs- Clear to ausculation bilaterally, normal work of breathing Chest- R sided pacemaker pocket is well healed Heart- Regular rate and rhythm, no murmurs, rubs or gallops, PMI not laterally displaced GI- soft, NT, ND, + BS Extremities- no clubbing, cyanosis, or edema  Device interrogation is reviewed and normal today  A/P 1. Tachy/brady syndrome Normal pacemaker function See Pace Art report No changes today  2. Afib Stable (af burden 3%) Continue coumadin No change required today  Return in 1 year to see EP NP  Follow-up with Dr Mare Ferrari as scheduled carelink every 3 months

## 2015-02-23 DIAGNOSIS — M6250 Muscle wasting and atrophy, not elsewhere classified, unspecified site: Secondary | ICD-10-CM | POA: Diagnosis not present

## 2015-03-05 ENCOUNTER — Telehealth: Payer: Self-pay | Admitting: *Deleted

## 2015-03-05 NOTE — Telephone Encounter (Signed)
Patient's daughter called and stated that the patient has started drinking Slimfast twice a day for the last 2 days. She read the ingredients label to me over the phone and she states that there is Vitamin K in the Slimfast and it has 25% of the daily value.  Advised that if she wants to continue drinking Slim Fast to substitute it for a green veggie intake. She states the patient will not drink anymore or if the patient does they will substitute the slim fast for greens.

## 2015-03-19 ENCOUNTER — Ambulatory Visit (INDEPENDENT_AMBULATORY_CARE_PROVIDER_SITE_OTHER): Payer: Medicare Other | Admitting: *Deleted

## 2015-03-19 DIAGNOSIS — I4891 Unspecified atrial fibrillation: Secondary | ICD-10-CM | POA: Diagnosis not present

## 2015-03-19 LAB — POCT INR: INR: 2

## 2015-03-25 DIAGNOSIS — M6250 Muscle wasting and atrophy, not elsewhere classified, unspecified site: Secondary | ICD-10-CM | POA: Diagnosis not present

## 2015-04-09 ENCOUNTER — Other Ambulatory Visit: Payer: Self-pay | Admitting: Internal Medicine

## 2015-04-17 ENCOUNTER — Ambulatory Visit: Payer: Self-pay | Admitting: Internal Medicine

## 2015-04-17 ENCOUNTER — Other Ambulatory Visit: Payer: Self-pay | Admitting: Physician Assistant

## 2015-04-17 ENCOUNTER — Other Ambulatory Visit: Payer: Self-pay | Admitting: Internal Medicine

## 2015-04-24 ENCOUNTER — Ambulatory Visit: Payer: Self-pay | Admitting: Internal Medicine

## 2015-04-25 DIAGNOSIS — M6250 Muscle wasting and atrophy, not elsewhere classified, unspecified site: Secondary | ICD-10-CM | POA: Diagnosis not present

## 2015-04-30 ENCOUNTER — Ambulatory Visit (INDEPENDENT_AMBULATORY_CARE_PROVIDER_SITE_OTHER): Payer: Medicare Other | Admitting: *Deleted

## 2015-04-30 DIAGNOSIS — I4891 Unspecified atrial fibrillation: Secondary | ICD-10-CM

## 2015-04-30 LAB — POCT INR: INR: 1.7

## 2015-05-07 ENCOUNTER — Telehealth: Payer: Self-pay | Admitting: Cardiology

## 2015-05-07 ENCOUNTER — Ambulatory Visit (INDEPENDENT_AMBULATORY_CARE_PROVIDER_SITE_OTHER): Payer: Medicare Other | Admitting: *Deleted

## 2015-05-07 ENCOUNTER — Encounter: Payer: Self-pay | Admitting: Internal Medicine

## 2015-05-07 DIAGNOSIS — I495 Sick sinus syndrome: Secondary | ICD-10-CM | POA: Diagnosis not present

## 2015-05-07 NOTE — Progress Notes (Signed)
Remote pacemaker transmission.   

## 2015-05-07 NOTE — Telephone Encounter (Signed)
Attempted to confirm remote transmission with pt. No answer and was unable to leave a message.   

## 2015-05-14 ENCOUNTER — Encounter: Payer: Self-pay | Admitting: *Deleted

## 2015-05-19 ENCOUNTER — Ambulatory Visit (INDEPENDENT_AMBULATORY_CARE_PROVIDER_SITE_OTHER): Payer: Medicare Other | Admitting: Pharmacist

## 2015-05-19 ENCOUNTER — Encounter: Payer: Self-pay | Admitting: Cardiology

## 2015-05-19 ENCOUNTER — Ambulatory Visit (INDEPENDENT_AMBULATORY_CARE_PROVIDER_SITE_OTHER): Payer: Medicare Other | Admitting: Cardiology

## 2015-05-19 VITALS — BP 128/70 | HR 67 | Ht 61.0 in | Wt 148.1 lb

## 2015-05-19 DIAGNOSIS — I480 Paroxysmal atrial fibrillation: Secondary | ICD-10-CM | POA: Diagnosis not present

## 2015-05-19 DIAGNOSIS — I350 Nonrheumatic aortic (valve) stenosis: Secondary | ICD-10-CM | POA: Diagnosis not present

## 2015-05-19 DIAGNOSIS — I4891 Unspecified atrial fibrillation: Secondary | ICD-10-CM

## 2015-05-19 DIAGNOSIS — R0609 Other forms of dyspnea: Secondary | ICD-10-CM

## 2015-05-19 LAB — CUP PACEART REMOTE DEVICE CHECK
Battery Impedance: 366 Ohm
Battery Voltage: 2.8 V
Brady Statistic AP VP Percent: 0.1 %
Brady Statistic AP VS Percent: 86.1 %
Brady Statistic AS VP Percent: 0.1 % — CL
Date Time Interrogation Session: 20160912104442
Lead Channel Impedance Value: 514 Ohm
Lead Channel Pacing Threshold Amplitude: 0.375 V
Lead Channel Pacing Threshold Amplitude: 0.75 V
Lead Channel Pacing Threshold Pulse Width: 0.4 ms
Lead Channel Sensing Intrinsic Amplitude: 5.6 mV
Lead Channel Setting Pacing Amplitude: 2.5 V
Lead Channel Setting Pacing Pulse Width: 0.4 ms
Lead Channel Setting Sensing Sensitivity: 2 mV
MDC IDC MSMT LEADCHNL RV IMPEDANCE VALUE: 425 Ohm
MDC IDC MSMT LEADCHNL RV PACING THRESHOLD PULSEWIDTH: 0.4 ms
MDC IDC SET LEADCHNL RA PACING AMPLITUDE: 2 V
MDC IDC STAT BRADY AS VS PERCENT: 13.7 %

## 2015-05-19 LAB — POCT INR: INR: 1.8

## 2015-05-19 MED ORDER — NITROGLYCERIN 0.4 MG SL SUBL: 0.4000 mg | SUBLINGUAL_TABLET | SUBLINGUAL | Status: DC | PRN

## 2015-05-19 MED ORDER — ATENOLOL 100 MG PO TABS: 100.0000 mg | ORAL_TABLET | Freq: Two times a day (BID) | ORAL | Status: DC

## 2015-05-19 NOTE — Patient Instructions (Signed)
Medication Instructions:  INCREASE YOUR ATENOLOL TO 100 MG TWICE A DAY   Labwork: NONE  Testing/Procedures: NONE  Follow-Up: Your physician wants you to follow-up in: 4 MONTH OV/EKG  You will receive a reminder letter in the mail two months in advance. If you don't receive a letter, please call our office to schedule the follow-up appointment.

## 2015-05-19 NOTE — Progress Notes (Signed)
Cardiology Office Note   Date:  05/19/2015   ID:  [name], DOB 05-22-1934, MRN [id number]  PCP:  [name], MD  Cardiologist: Darlin Coco MD  No chief complaint on file.     History of Present Illness: [name] is a 81 y.o. female who presents for a four-month follow-up office visit. This pleasant 81 year old woman is seen for a scheduled followup office visit. She has a past history of tachybradycardia syndrome . She has a permanent pacemaker implanted 07/01/10 . She has a remote history of repair of a patent ductus arteriosus. She has a history of moderate valvular aortic stenosis. She has had a history of congestive heart failure. Most recent admission was 08/25/12-08/29/12 for exacerbation of CHF. She was hospitalized again in November 2015 for congestive heart failure and pneumonia. Her last echocardiogram 08/26/12 showed ejection fraction of 60-65% with grade 2 diastolic dysfunction and a peak aortic valve gradient of 29 with a mean aortic valve gradient of 14. Her more recent echocardiogram in September 2015 showed ejection fraction of 55-60%. The aortic valve showed mild stenosis and moderate regurgitation.  She has had a total of 3 operations on her right leg. Dr. Alvan Dame is her orthopedist. 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 She had carotid Dopplers which did not show any significant obstruction. Since last visit she has been experiencing more atrial fibrillation.  She is having about 2 episodes of weak.  Sometimes the episodes lasted.  He.  She is fully anticoagulated.  She has been taking any atenolol 75 mg twice a day.  We will increase this to 100 mg twice a day to help with rhythm control. She is watching her diet.  Her weight is down 4 pounds since last visit.  Her blood pressure at home has been satisfactory.  Past Medical History  Diagnosis Date  .  PAF (paroxysmal atrial fibrillation)     controlled with amiodarone, on coumadin  . CAD (coronary artery disease)   . Aortic root dilatation   . Moderate aortic stenosis   . Chronic anticoagulation   . Tachycardia-bradycardia syndrome     s/p PPM by Dr Doreatha Lew ([facility]) 07/03/10  . GERD (gastroesophageal reflux disease)   . Pacemaker     2012  . Neuropathy     feet/legs  . Restless leg syndrome   . Headache(784.0)     hx migraines - takes Zoloft for migraines  . History of uterine cancer 1980s    treated with hysterectomy, external radiation and radiation seed implants.   . Cancer     hx Kidney cancer / hx of endometrial cancer  . PONV (postoperative nausea and vomiting)   . Anxiety   . Heart murmur   . HTN (hypertension)   . Hyperlipemia   . Hypothyroidism   . Osteomyelitis as child  . Family history of anesthesia complication     daughter has ponv  . Syncope 05/28/2014  . Hepatic steatosis   . Elevated LFTs   . Gastroparesis   . Anal fissure   . Radiation proctitis   . Gastric polyp     adenomatous  . Neuromuscular disorder     neuropathy in right leg  . Chronic kidney disease     only has one kidney   . 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 Cardiac catheterization  2008  . Nephrectomy Left     renal cell cancer  . Total abdominal hysterectomy    . Patent ductus arterious repair  age 62  . Ptvp    . Pacemaker insertion  07/04/11    by Dr Doreatha Lew  . Cholecystectomy  1970s  . Upper gastrointestinal endoscopy    . Total knee arthroplasty  02/07/2012    Procedure: TOTAL KNEE ARTHROPLASTY;  Surgeon: Mauri Pole, MD;  Location: [facility];  Service: Orthopedics;  Laterality: Right;  . Insert / replace / remove pacemaker    . Total hip revision Right 2009  . I&d knee with poly exchange Right 12/17/2013    Procedure: IRRIGATION AND DEBRIDEMEN RIGHT TOTAL  KNEE WITH POLY EXCHANGE;  Surgeon: Mauri Pole, MD;  Location: [facility];   Service: Orthopedics;  Laterality: Right;  . Patellar tendon repair Right 03/06/2014    Procedure: AVULSION WITH PATELLA TENDON REPAIR;  Surgeon: Mauri Pole, MD;  Location: [facility];  Service: Orthopedics;  Laterality: Right;  . Total knee revision Right 07/29/2014    Procedure: RIGHT KNEE REVISION OF PREVIOUS REPAIR EXTENSOR MECHANISM;  Surgeon: Mauri Pole, MD;  Location: [facility];  Service: Orthopedics;  Laterality: Right;     Current Outpatient Prescriptions  Medication Sig Dispense Refill  . ALPRAZolam (XANAX) 0.5 MG tablet Take 0.25 mg by mouth 3 (three) times daily as needed for anxiety.     [name] atenolol (TENORMIN) 100 MG tablet Take 1 tablet (100 mg total) by mouth 2 (two) times daily. 60 tablet 11  . Biotin 5000 MCG CAPS Take 10,000 mcg by mouth every morning.     . Cholecalciferol (VITAMIN D PO) Take 5,000 Units by mouth every morning.     . furosemide (LASIX) 20 MG tablet Take 10 mg by mouth every morning.     [name] levothyroxine (SYNTHROID, LEVOTHROID) 50 MCG tablet Take 50 mcg by mouth daily before breakfast.    . losartan (COZAAR) 100 MG tablet Take 100 mg by mouth daily.    . methocarbamol (ROBAXIN) 500 MG tablet Take 1 tablet (500 mg total) by mouth every 6 (six) hours as needed for muscle spasms. 50 tablet 0  . nitroGLYCERIN (NITROSTAT) 0.4 MG SL tablet Place 1 tablet (0.4 mg total) under the tongue every 5 (five) minutes as needed for chest pain (MAX 3 TABLETS). 25 tablet prn  . potassium chloride SA (K-DUR,KLOR-CON) 20 MEQ tablet Take 20 mEq by mouth 2 (two) times daily.    . pregabalin (LYRICA) 75 MG capsule Take 75 mg by mouth 2 (two) times daily.    . ranitidine (ZANTAC) 150 MG tablet Take 150 mg by mouth 2 (two) times daily.     . sertraline (ZOLOFT) 50 MG tablet Take 50 mg by mouth daily. For mood    . traMADol (ULTRAM) 50 MG tablet Take 50 mg by mouth every 6 (six) hours as needed (pain).    . triamcinolone cream (KENALOG) 0.1 % Apply 1 application topically daily as  needed (itching).   3  . triamcinolone cream (KENALOG) 0.1 % APPLY TO AFFECTED AREA TWICE A DAY 45 g 2  . warfarin (COUMADIN) 2 MG tablet Take by mouth daily. Take 1/2 to 1 and 1/2 tablet by mouth as directed by the coumadin clinic     No current facility-administered medications for this visit.    Allergies:   Codeine; Gabapentin; Hydrocodone; Morphine and related; Requip; and Zinc    Social History:  The patient  reports that she has never smoked. She has never used smokeless tobacco. She reports that she does not drink alcohol or use illicit drugs.   Family History:  The patient's family history includes Colon cancer (age of onset: 26) in her mother; Heart attack in her father; Heart disease in her father, maternal grandmother, and mother.    ROS:  Please see the history of present illness.   Otherwise, review of systems are positive for none.   All other systems are reviewed and negative.    PHYSICAL EXAM: VS:  BP 128/70 mmHg  Pulse 67  Ht 5\' 1"  (1.549 m)  Wt 148 lb 1.9 oz (67.187 kg)  BMI 28.00 kg/m2  LMP  (Exact Date) , BMI Body mass index is 28 kg/(m^2). GEN: Well nourished, well developed, in no acute distress HEENT: normal Neck: no JVD, carotid bruits, or masses Cardiac: RRR; no , rubs, or gallops,no edema .  Atrial paced rhythm today.  There is a grade 2/6 systolic ejection murmur of aortic stenosis at the base. Respiratory:  clear to auscultation bilaterally, normal work of breathing GI: soft, nontender, nondistended, + BS MS: no deformity or atrophy Skin: warm and dry, no rash Neuro:  Strength and sensation are intact Psych: euthymic mood, full affect   EKG:  EKG is ordered today. The ekg ordered today demonstrates atrial paced rhythm.  Nonspecific ST-T wave changes.   Recent Labs: 08/02/2014: Pro B Natriuretic peptide (BNP) 11208.0* 09/25/2014: ALT 13; BUN 26*; Creat 1.10; Hemoglobin 13.5; Magnesium 1.6; Platelets 211; Potassium 3.8; Sodium 145; TSH 1.736     Lipid Panel    Component Value Date/Time   CHOL 219* 09/25/2014 1146   TRIG 318* 09/25/2014 1146   HDL 36* 09/25/2014 1146   CHOLHDL 6.1 09/25/2014 1146   VLDL 64* 09/25/2014 1146   LDLCALC 119* 09/25/2014 1146      Wt Readings from Last 3 Encounters:  05/19/15 148 lb 1.9 oz (67.187 kg)  02/05/15 152 lb 12.8 oz (69.31 kg)  01/14/15 152 lb (68.947 kg)        ASSESSMENT AND PLAN:  1. Paroxysmal atrial fibrillation.  Frequency of paroxysmal atrial fibrillation and has increased. 2. tachybradycardia syndrome with functioning dual-chamber pacemaker. 3. essential hypertension without heart failure. 4. hypercholesterolemia followed by PCP 5. osteoarthritis of right knee followed by Dr. Alvan Dame 6. mild to moderate aortic stenosis 7. history of left nephrectomy secondary to kidney cancer 8. history of endometrial cancer 9. GERD 10. History of cholesterol embolus to her left eye with central retinal artery occlusion and loss of vision  Plan: Continue same medication except increase atenolol to 100 mg twice a day to help with rhythm control. Recheck in 4 months for office visit and EKG.   Current medicines are reviewed at length with the patient today.  The patient does not have concerns regarding medicines.  The following changes have been made:  Increase atenolol to 100 mg twice a day  Labs/ tests ordered today include:   Orders Placed This Encounter  Procedures  . EKG 12-Lead       Signed, Darlin Coco MD 05/19/2015 2:23 PM    [address], [place], [place]  [id number] Phone: [phone]; Fax: [phone]

## 2015-05-20 ENCOUNTER — Other Ambulatory Visit: Payer: Self-pay | Admitting: Internal Medicine

## 2015-05-26 ENCOUNTER — Encounter: Payer: Self-pay | Admitting: *Deleted

## 2015-05-26 ENCOUNTER — Other Ambulatory Visit: Payer: Self-pay | Admitting: *Deleted

## 2015-05-26 MED ORDER — PREGABALIN 75 MG PO CAPS
75.0000 mg | ORAL_CAPSULE | Freq: Two times a day (BID) | ORAL | Status: DC
Start: 2015-05-26 — End: 2015-11-29

## 2015-05-27 ENCOUNTER — Encounter: Payer: Self-pay | Admitting: Cardiology

## 2015-06-02 ENCOUNTER — Other Ambulatory Visit: Payer: Self-pay | Admitting: Physician Assistant

## 2015-06-02 ENCOUNTER — Other Ambulatory Visit: Payer: Self-pay | Admitting: Internal Medicine

## 2015-06-04 ENCOUNTER — Other Ambulatory Visit: Payer: Self-pay | Admitting: Internal Medicine

## 2015-06-05 DIAGNOSIS — R3915 Urgency of urination: Secondary | ICD-10-CM | POA: Diagnosis not present

## 2015-06-09 ENCOUNTER — Telehealth: Payer: Self-pay | Admitting: *Deleted

## 2015-06-09 NOTE — Telephone Encounter (Signed)
Pts daughter called to states pts spouse, her father is very ill and not doing well at all. Thus, she needs to cancel appt for today. She does mention that pt is on Keflex 500mg s BID with last dose on Wednesday, and she's also took Diflucan 150mg  on Friday and has a dose to take today. Explained that Diflucan does interaction with Coumadin and we should check INR later this but daughter states due to her father condition she doesn't think she can get pt in, risk explained. She will call back asap to reschedule appt. Instructed to give pt 1 tablet daily instead of 1.5 tablets and eat some extra leafy green vegetables today and seek medical attention for any bleeding or bruising issues.

## 2015-06-17 DIAGNOSIS — N3946 Mixed incontinence: Secondary | ICD-10-CM | POA: Diagnosis not present

## 2015-06-17 DIAGNOSIS — Z8554 Personal history of malignant neoplasm of ureter: Secondary | ICD-10-CM | POA: Diagnosis not present

## 2015-06-18 ENCOUNTER — Ambulatory Visit (INDEPENDENT_AMBULATORY_CARE_PROVIDER_SITE_OTHER): Payer: Medicare Other | Admitting: *Deleted

## 2015-06-18 DIAGNOSIS — I4891 Unspecified atrial fibrillation: Secondary | ICD-10-CM | POA: Diagnosis not present

## 2015-06-18 DIAGNOSIS — Z96651 Presence of right artificial knee joint: Secondary | ICD-10-CM | POA: Diagnosis not present

## 2015-06-18 DIAGNOSIS — Z471 Aftercare following joint replacement surgery: Secondary | ICD-10-CM | POA: Diagnosis not present

## 2015-06-18 LAB — POCT INR: INR: 3

## 2015-06-23 ENCOUNTER — Other Ambulatory Visit: Payer: Self-pay | Admitting: Nurse Practitioner

## 2015-06-23 DIAGNOSIS — H903 Sensorineural hearing loss, bilateral: Secondary | ICD-10-CM | POA: Diagnosis not present

## 2015-07-03 DIAGNOSIS — H3412 Central retinal artery occlusion, left eye: Secondary | ICD-10-CM | POA: Diagnosis not present

## 2015-07-03 DIAGNOSIS — H35033 Hypertensive retinopathy, bilateral: Secondary | ICD-10-CM | POA: Diagnosis not present

## 2015-07-03 DIAGNOSIS — H35371 Puckering of macula, right eye: Secondary | ICD-10-CM | POA: Diagnosis not present

## 2015-07-18 ENCOUNTER — Ambulatory Visit (INDEPENDENT_AMBULATORY_CARE_PROVIDER_SITE_OTHER): Payer: Medicare Other | Admitting: Internal Medicine

## 2015-07-18 ENCOUNTER — Ambulatory Visit (INDEPENDENT_AMBULATORY_CARE_PROVIDER_SITE_OTHER): Payer: Medicare Other | Admitting: Pharmacist

## 2015-07-18 ENCOUNTER — Encounter: Payer: Self-pay | Admitting: Internal Medicine

## 2015-07-18 VITALS — BP 152/78 | HR 66 | Temp 98.2°F | Resp 16 | Ht 61.0 in | Wt 151.0 lb

## 2015-07-18 DIAGNOSIS — Z79899 Other long term (current) drug therapy: Secondary | ICD-10-CM

## 2015-07-18 DIAGNOSIS — E039 Hypothyroidism, unspecified: Secondary | ICD-10-CM

## 2015-07-18 DIAGNOSIS — I10 Essential (primary) hypertension: Secondary | ICD-10-CM | POA: Diagnosis not present

## 2015-07-18 DIAGNOSIS — E785 Hyperlipidemia, unspecified: Secondary | ICD-10-CM | POA: Diagnosis not present

## 2015-07-18 DIAGNOSIS — R7303 Prediabetes: Secondary | ICD-10-CM

## 2015-07-18 DIAGNOSIS — I480 Paroxysmal atrial fibrillation: Secondary | ICD-10-CM

## 2015-07-18 DIAGNOSIS — E559 Vitamin D deficiency, unspecified: Secondary | ICD-10-CM | POA: Diagnosis not present

## 2015-07-18 DIAGNOSIS — Z23 Encounter for immunization: Secondary | ICD-10-CM

## 2015-07-18 DIAGNOSIS — Z6828 Body mass index (BMI) 28.0-28.9, adult: Secondary | ICD-10-CM

## 2015-07-18 DIAGNOSIS — I4891 Unspecified atrial fibrillation: Secondary | ICD-10-CM

## 2015-07-18 DIAGNOSIS — R7309 Other abnormal glucose: Secondary | ICD-10-CM | POA: Diagnosis not present

## 2015-07-18 LAB — BASIC METABOLIC PANEL WITH GFR
BUN: 23 mg/dL (ref 7–25)
CALCIUM: 8.7 mg/dL (ref 8.6–10.4)
CO2: 20 mmol/L (ref 20–31)
CREATININE: 1.14 mg/dL — AB (ref 0.60–0.93)
Chloride: 110 mmol/L (ref 98–110)
GFR, EST AFRICAN AMERICAN: 53 mL/min — AB (ref >=60)
GFR, Est Non African American: 46 mL/min — ABNORMAL LOW (ref >=60)
Glucose, Bld: 86 mg/dL (ref 65–99)
Potassium: 4.1 mmol/L (ref 3.5–5.3)
SODIUM: 142 mmol/L (ref 135–146)

## 2015-07-18 LAB — CBC WITH DIFFERENTIAL/PLATELET
BASOS PCT: 1 % (ref 0–1)
Basophils Absolute: 0.1 10*3/uL (ref 0.0–0.1)
EOS ABS: 0.3 10*3/uL (ref 0.0–0.7)
Eosinophils Relative: 4 % (ref 0–5)
HCT: 39.2 % (ref 36.0–46.0)
Hemoglobin: 12.9 g/dL (ref 12.0–15.0)
Lymphocytes Relative: 24 % (ref 12–46)
Lymphs Abs: 1.9 10*3/uL (ref 0.7–4.0)
MCH: 30 pg (ref 26.0–34.0)
MCHC: 32.9 g/dL (ref 30.0–36.0)
MCV: 91.2 fL (ref 78.0–100.0)
MONO ABS: 0.8 10*3/uL (ref 0.1–1.0)
MONOS PCT: 10 % (ref 3–12)
MPV: 10.1 fL (ref 8.6–12.4)
NEUTROS PCT: 61 % (ref 43–77)
Neutro Abs: 4.8 10*3/uL (ref 1.7–7.7)
PLATELETS: 204 10*3/uL (ref 150–400)
RBC: 4.3 MIL/uL (ref 3.87–5.11)
RDW: 14.5 % (ref 11.5–15.5)
WBC: 7.8 10*3/uL (ref 4.0–10.5)

## 2015-07-18 LAB — HEPATIC FUNCTION PANEL
ALT: 10 U/L (ref 6–29)
AST: 17 U/L (ref 10–35)
Albumin: 3.6 g/dL (ref 3.6–5.1)
Alkaline Phosphatase: 138 U/L — ABNORMAL HIGH (ref 33–130)
BILIRUBIN TOTAL: 0.3 mg/dL (ref 0.2–1.2)
Total Protein: 6.2 g/dL (ref 6.1–8.1)

## 2015-07-18 LAB — LIPID PANEL
CHOL/HDL RATIO: 6.7 ratio — AB (ref <=5.0)
CHOLESTEROL: 194 mg/dL (ref 125–200)
HDL: 29 mg/dL — AB (ref >=46)
LDL Cholesterol: 115 mg/dL (ref <130)
TRIGLYCERIDES: 251 mg/dL — AB (ref <150)
VLDL: 50 mg/dL — ABNORMAL HIGH (ref <30)

## 2015-07-18 LAB — HEMOGLOBIN A1C
HEMOGLOBIN A1C: 5.5 % (ref <5.7)
Mean Plasma Glucose: 111 mg/dL (ref <117)

## 2015-07-18 LAB — TSH: TSH: 1.783 u[IU]/mL (ref 0.350–4.500)

## 2015-07-18 LAB — POCT INR: INR: 2.1

## 2015-07-18 LAB — MAGNESIUM: MAGNESIUM: 1.6 mg/dL (ref 1.5–2.5)

## 2015-07-18 NOTE — Patient Instructions (Signed)
Recommend Adult Low Dose Aspirin or   coated  Aspirin 81 mg daily   To reduce risk of Colon Cancer 20 %,   Skin Cancer 26 % ,   Melanoma 46%   and   Pancreatic cancer 60%   ++++++++++++++++++++++++++++++++++++++++++++++++++++++  Vitamin D goal   is between 70-100.   Please make sure that you are taking your Vitamin D as directed.   It is very important as a natural anti-inflammatory   helping hair, skin, and nails, as well as reducing stroke and heart attack risk.   It helps your bones and helps with mood.  It also decreases numerous cancer risks so please take it as directed.   Low Vit D is associated with a 200-300% higher risk for CANCER   and 200-300% higher risk for HEART   ATTACK  &  STROKE.   ......................................  It is also associated with higher death rate at younger ages,   autoimmune diseases like Rheumatoid arthritis, Lupus, Multiple Sclerosis.     Also many other serious conditions, like depression, Alzheimer's  Dementia, infertility, muscle aches, fatigue, fibromyalgia - just to name a few.  ++++++++++++++++++++++++++++++++++++++++++++++++  Recommend the book "The END of DIETING" by Dr Joel Fuhrman   & the book "The END of DIABETES " by Dr Joel Fuhrman  At [employer] - get book & Audio CD's     Being diabetic has a  300% increased risk for heart attack, stroke, cancer, and alzheimer- type vascular dementia. It is very important that you work harder with diet by avoiding all foods that are white. Avoid white rice (brown & wild rice is OK), white potatoes (sweetpotatoes in moderation is OK), White bread or wheat bread or anything made out of white flour like bagels, donuts, rolls, buns, biscuits, cakes, pastries, cookies, pizza crust, and pasta (made from white flour & egg whites) - vegetarian pasta or spinach or wheat pasta is OK. Multigrain breads like Arnold's or Pepper[employer], or multigrain sandwich thins or flatbreads.  Diet,  exercise and weight loss can reverse and cure diabetes in the early stages.  Diet, exercise and weight loss is very important in the control and prevention of complications of diabetes which affects every system in your body, ie. Brain - dementia/stroke, eyes - glaucoma/blindness, heart - heart attack/heart failure, kidneys - dialysis, stomach - gastric paralysis, intestines - malabsorption, nerves - severe painful neuritis, circulation - gangrene & loss of a leg(s), and finally cancer and Alzheimers.    I recommend avoid fried & greasy foods,  sweets/candy, white rice (brown or wild rice or Quinoa is OK), white potatoes (sweet potatoes are OK) - anything made from white flour - bagels, doughnuts, rolls, buns, biscuits,white and wheat breads, pizza crust and traditional pasta made of white flour & egg white(vegetarian pasta or spinach or wheat pasta is OK).  Multi-grain bread is OK - like multi-grain flat bread or sandwich thins. Avoid alcohol in excess. Exercise is also important.    Eat all the vegetables you want - avoid meat, especially red meat and dairy - especially cheese.  Cheese is the most concentrated form of trans-fats which is the worst thing to clog up our arteries. Veggie cheese is OK which can be found in the fresh produce section at [employer] or [employer] or [employer]  ++++++++++++++++++++++++++++++++++++++++++++++++++ DASH Eating Plan  DASH stands for "Dietary Approaches to Stop Hypertension."   The DASH eating plan is a healthy eating plan that has been shown to reduce high   blood pressure (hypertension). Additional health benefits may include reducing the risk of type 2 diabetes mellitus, heart disease, and stroke. The DASH eating plan may also help with weight loss.  WHAT DO I NEED TO KNOW ABOUT THE DASH EATING PLAN? For the DASH eating plan, you will follow these general guidelines:  Choose foods with a percent daily value for sodium of less than 5% (as listed on the food  label).  Use salt-free seasonings or herbs instead of table salt or sea salt.  Check with your health care provider or pharmacist before using salt substitutes.  Eat lower-sodium products, often labeled as "lower sodium" or "no salt added."  Eat fresh foods.  Eat more vegetables, fruits, and low-fat dairy products.    Choose whole grains. Look for the word "whole" as the first word in the ingredient list.  Choose fish   Limit sweets, desserts, sugars, and sugary drinks.  Choose heart-healthy fats.  Eat veggie cheese   Eat more home-cooked food and less restaurant, buffet, and fast food.  Limit fried foods.  Cook foods using methods other than frying.  Limit canned vegetables. If you do use them, rinse them well to decrease the sodium.  When eating at a restaurant, ask that your food be prepared with less salt, or no salt if possible.                      WHAT FOODS CAN I EAT?  Seek help from a dietitian for individual calorie needs. Grains Whole grain or whole wheat bread. Brown rice. Whole grain or whole wheat pasta. Quinoa, bulgur, and whole grain cereals. Low-sodium cereals. Corn or whole wheat flour tortillas. Whole grain cornbread. Whole grain crackers. Low-sodium crackers.  Vegetables Fresh or frozen vegetables (raw, steamed, roasted, or grilled). Low-sodium or reduced-sodium tomato and vegetable juices. Low-sodium or reduced-sodium tomato sauce and paste. Low-sodium or reduced-sodium canned vegetables.   Fruits All fresh, canned (in natural juice), or frozen fruits.  Meat and Other Protein Products  All fish and seafood.  Dried beans, peas, or lentils. Unsalted nuts and seeds. Unsalted canned beans. Dairy Low-fat dairy products, such as skim or 1% milk, 2% or reduced-fat cheeses, low-fat ricotta or cottage cheese, or plain low-fat yogurt. Low-sodium or reduced-sodium cheeses.  Fats and Oils Tub margarines without trans fats. Light or reduced-fat mayonnaise  and salad dressings (reduced sodium). Avocado. Safflower, olive, or canola oils. Natural peanut or almond butter.  Other Unsalted popcorn and pretzels. The items listed above may not be a complete list of recommended foods or beverages. Contact your dietitian for more options.  +++++++++++++++++++++++++++++++++++++++++++  WHAT FOODS ARE NOT RECOMMENDED?  Grains/ White flour or wheat flour  White bread. White pasta. White rice. Refined cornbread. Bagels and croissants. Crackers that contain trans fat.  Vegetables  Creamed or fried vegetables. Vegetables in a . Regular canned vegetables. Regular canned tomato sauce and paste. Regular tomato and vegetable juices.  Fruits Dried fruits. Canned fruit in light or heavy syrup. Fruit juice.  Meat and Other Protein Products Meat in general. Fatty cuts of meat. Ribs, chicken wings, bacon, sausage, bologna, salami, chitterlings, fatback, hot dogs, bratwurst, and packaged luncheon meats. Salted nuts and seeds. Canned beans with salt.  Dairy Whole or 2% milk, cream, half-and-half, and cream cheese. Whole-fat or sweetened yogurt. Full-fat cheeses or blue cheese. Nondairy creamers and whipped toppings. Processed cheese, cheese spreads, or cheese curds.  Condiments Onion and garlic salt, seasoned salt, table salt, and sea   salt. Canned and packaged gravies. Worcestershire sauce. Tartar sauce. Barbecue sauce. Teriyaki sauce. Soy sauce, including reduced sodium. Steak sauce. Fish sauce. Oyster sauce. Cocktail sauce. Horseradish. Ketchup and mustard. Meat flavorings and tenderizers. Bouillon cubes. Hot sauce. Tabasco sauce. Marinades. Taco seasonings. Relishes.  Fats and Oils Butter, stick margarine, lard, shortening, ghee, and bacon fat. Coconut, palm kernel, or palm oils. Regular salad dressings.  Pickles and olives. Salted popcorn and pretzels. The items listed above may not be a complete list of foods and beverages to avoid.   

## 2015-07-19 ENCOUNTER — Encounter: Payer: Self-pay | Admitting: Internal Medicine

## 2015-07-19 DIAGNOSIS — Z6828 Body mass index (BMI) 28.0-28.9, adult: Secondary | ICD-10-CM | POA: Insufficient documentation

## 2015-07-19 LAB — VITAMIN D 25 HYDROXY (VIT D DEFICIENCY, FRACTURES): Vit D, 25-Hydroxy: 59 ng/mL (ref 30–100)

## 2015-07-19 LAB — INSULIN, RANDOM: Insulin: 44.8 u[IU]/mL — ABNORMAL HIGH (ref 2.0–19.6)

## 2015-07-19 NOTE — Progress Notes (Signed)
Patient ID: [name], female   DOB: [birth year], 81 y.o.   MRN: [id number]   This very nice 81 y.o. recently widowed WF after 35 yr marriage presents for  follow up with Hypertension, Hyperlipidemia, Pre-Diabetes, Hypothyroidism and Vitamin D Deficiency.    Patient is treated for HTN circa 1090's & BP has been controlled at home. Today's BP was 152/78 and rechecked at 142/82. .Patient also has hx/o pAfib with a pacemaker inserted in 2011 & has been followed on coumadin at the [facility] clinic. Also, she is has asx mild AoS/ARegurg  followed by Dr Mare Ferrari. Heart caths in 2008 & 2011 found no sig CAD. She does have a HTHD with [facility].   Patient has had no complaints of any cardiac type chest pain, palpitations, dyspnea/orthopnea/PND, dizziness, claudication, or dependent edema.   Hyperlipidemia is controlled with diet & meds. Patient denies myalgias or other med SE's. Last Lipids were not at goal with  Cholesterol 194; HDL 29*; LDL 115; and with elevated Triglycerides 251 on 07/18/2015.   Also, the patient has history of PreDiabetes predatig from 2012 with A1c 6.1% and has had no symptoms of reactive hypoglycemia, diabetic polys, paresthesias or visual blurring.  Last A1c was 5.5% on 07/18/2015.   atient has been on thyroid replacement many years. Further, the patient also has history of Vitamin D Deficiency of "19" in 2008 and supplements vitamin D without any suspected side-effects. Current  vitamin D is 59 - at goal.   Medication Sig  . ALPRAZolam 0.5 MG tablet TAKE 1/2 TO 1 TABLET BY MOUTH 3 TIMES A DAY AS NEEDED FOR ANXIETY  . atenolol  100 MG tablet Take 1 tablet (100 mg total) by mouth 2 (two) times daily.  . Biotin 5000 MCG CAPS Take 10,000 mcg by mouth every morning.   [name] VITAMIN D  Take 5,000 Units by mouth every morning.   [name] losartan  100 MG tablet TAKE 1 TABLET (100 MG TOTAL) BY MOUTH DAILY.  . methocarbamol (ROBAXIN) 500 MG  Take 1 tablet (500 mg total) by mouth every 6 (six)  hours as needed for muscle spasms.  [name] NITROSTAT 0.4 MG SL tablet Place 1 tablet (0.4 mg total) under the tongue every 5 (five) minutes as needed for chest pain (MAX 3 TABLETS).  . potassium chloride SA  20 MEQ tablet Take 20 mEq by mouth 2 (two) times daily.  . pregabalin75 MG capsule Take 1 capsule (75 mg total) by mouth 2 (two) times daily.  . ranitidine  150 MG tablet Take 150 mg by mouth 2 (two) times daily.   . sertraline  50 MG tablet Take 50 mg by mouth daily. For mood  . traMADol  50 MG tablet Take 50 mg by mouth every 6 (six) hours as needed (pain).  . triamcinolone crm  0.1 % APPLY TO AFFECTED AREA TWICE A DAY  . warfarin  2 MG tablet Take as directed  . furosemide  20 MG tablet Take 10 mg by mouth every morning.   [name] levothyroxine 50 MCG tablet Take 50 mcg by mouth daily before breakfast.   Allergies  Allergen Reactions  . Codeine Nausea And Vomiting  . Gabapentin Itching  . Hydrocodone Other (See Comments)    hallucination  . Morphine And Related Nausea And Vomiting  . Requip [Ropinirole Hcl]     Headache   . Zinc     nausea   PMHx:   Past Medical History  Diagnosis Date  . PAF (  paroxysmal atrial fibrillation) (HCC)     controlled with amiodarone, on coumadin  . CAD (coronary artery disease)   . Aortic root dilatation ([place])   . Moderate aortic stenosis   . Chronic anticoagulation   . Tachycardia-bradycardia syndrome ([place])     s/p PPM by Dr Doreatha Lew (MDT) 07/03/10  . GERD (gastroesophageal reflux disease)   . Pacemaker     2012  . Neuropathy ([place])     feet/legs  . Restless leg syndrome   . Headache(784.0)     hx migraines - takes Zoloft for migraines  . History of uterine cancer 1980s    treated with hysterectomy, external radiation and radiation seed implants.   . Cancer ([place])     hx Kidney cancer / hx of endometrial cancer  . PONV (postoperative nausea and vomiting)   . Anxiety   . Heart murmur   . HTN (hypertension)   . Hyperlipemia   . Hypothyroidism    . Osteomyelitis ([place]) as child  . Family history of anesthesia complication     daughter has ponv  . Syncope 05/28/2014  . Hepatic steatosis   . Elevated LFTs   . Gastroparesis   . Anal fissure   . Radiation proctitis   . Gastric polyp     adenomatous  . Neuromuscular disorder (HCC)     neuropathy in right leg  . Chronic kidney disease     only has one kidney   . 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 Influenza, High Dose Seasonal PF 07/18/2015  . Influenza,inj,Quad PF,36+ Mos 05/31/2014  . Influenza-Unspecified 09/16/2009, 07/03/2013  . Pneumococcal Conjugate-13 09/06/2002  . Pneumococcal Polysaccharide-23 07/18/2015  . Td 09/06/2002  . Zoster 09/06/2008   Past Surgical History  Procedure Laterality Date  . Cardiac catheterization  2008  . Nephrectomy Left     renal cell cancer  . Total abdominal hysterectomy    . Patent ductus arterious repair  age 19  . Ptvp    . Pacemaker insertion  07/04/11    by Dr Doreatha Lew  . Cholecystectomy  1970s  . Upper gastrointestinal endoscopy    . Total knee arthroplasty  02/07/2012    Procedure: TOTAL KNEE ARTHROPLASTY;  Surgeon: Mauri Pole, MD;  Location: [facility];  Service: Orthopedics;  Laterality: Right;  . Insert / replace / remove pacemaker    . Total hip revision Right 2009  . I&d knee with poly exchange Right 12/17/2013    Procedure: IRRIGATION AND DEBRIDEMEN RIGHT TOTAL  KNEE WITH POLY EXCHANGE;  Surgeon: Mauri Pole, MD;  Location: [facility];  Service: Orthopedics;  Laterality: Right;  . Patellar tendon repair Right 03/06/2014    Procedure: AVULSION WITH PATELLA TENDON REPAIR;  Surgeon: Mauri Pole, MD;  Location: [facility];  Service: Orthopedics;  Laterality: Right;  . Total knee revision Right 07/29/2014    Procedure: RIGHT KNEE REVISION OF PREVIOUS REPAIR EXTENSOR MECHANISM;  Surgeon: Mauri Pole, MD;  Location: [facility];  Service: Orthopedics;  Laterality: Right;   FHx:     Reviewed / unchanged  SHx:    Reviewed / unchanged  Systems Review:  Constitutional: Denies fever, chills, wt changes, headaches, insomnia, fatigue, night sweats, change in appetite. Eyes: Denies redness, blurred vision, diplopia, discharge, itchy, watery eyes.  ENT: Denies discharge, congestion, post nasal drip, epistaxis, sore throat, earache, hearing loss, dental pain, tinnitus, vertigo, sinus pain, snoring.  CV: Denies chest pain, palpitations, irregular heartbeat, syncope,  dyspnea, diaphoresis, orthopnea, PND, claudication or edema. Respiratory: denies cough, dyspnea, DOE, pleurisy, hoarseness, laryngitis, wheezing.  Gastrointestinal: Denies dysphagia, odynophagia, heartburn, reflux, water brash, abdominal pain or cramps, nausea, vomiting, bloating, diarrhea, constipation, hematemesis, melena, hematochezia  or hemorrhoids. Genitourinary: Denies dysuria, frequency, urgency, nocturia, hesitancy, discharge, hematuria or flank pain. Musculoskeletal: Denies arthralgias, myalgias, stiffness, jt. swelling, pain, limping or strain/sprain.  Skin: Denies pruritus, rash, hives, warts, acne, eczema or change in skin lesion(s). Neuro: No weakness, tremor, incoordination, spasms, paresthesia or pain. Psychiatric: Denies confusion, memory loss or sensory loss. Endo: Denies change in weight, skin or hair change.  Heme/Lymph: No excessive bleeding, bruising or enlarged lymph nodes.  Physical Exam  BP 152/78 mmHg  Pulse 66  Temp(Src) 98.2 F (36.8 C) (Temporal)  Resp 16  Ht 5\' 1"  (1.549 m)  Wt 151 lb (68.493 kg)  BMI 28.55 kg/m2  LMP  (Exact Date)  Appears well nourished and in no distress. Eyes: PERRLA, EOMs, conjunctiva no swelling or erythema. Sinuses: No frontal/maxillary tenderness ENT/Mouth: EAC's clear, TM's nl w/o erythema, bulging. Nares clear w/o erythema, swelling, exudates. Oropharynx clear without erythema or exudates. Oral hygiene is good. Tongue normal, non obstructing. Hearing  intact.  Neck: Supple. Thyroid nl. Car 2+/2+ without bruits, nodes or JVD. Chest: Respirations nl with BS clear & equal w/o rales, rhonchi, wheezing or stridor.  Cor: Heart sounds normal w/ regular rate and rhythm without sig. murmurs, gallops, clicks, or rubs. Peripheral pulses normal and equal  without edema.  Abdomen: Soft & bowel sounds normal. Non-tender w/o guarding, rebound, hernias, masses, or organomegaly.  Lymphatics: Unremarkable.  Musculoskeletal: Full ROM all peripheral extremities, joint stability, 5/5 strength, and normal gait.  Skin: Warm, dry without exposed rashes, lesions or ecchymosis apparent.  Neuro: Cranial nerves intact, reflexes equal bilaterally. Sensory-motor testing grossly intact. Tendon reflexes grossly intact.  Pysch: Alert & oriented x 3.  Insight and judgement nl & appropriate. No ideations.  Assessment and Plan:  1. Hypertension - Continue monitor blood pressure at home. Continue diet/meds same.  2. Hyperlipidemia - Continue diet/meds, exercise,& lifestyle modifications. Continue monitor periodic cholesterol/liver & renal functions   3. T2_NIDDM Pre-Diabetes - Continue diet, exercise, lifestyle modifications. Monitor appropriate labs.  4. Vitamin D Deficiency - Continue supplementation.   Recommended regular exercise, BP monitoring, weight control, and discussed med and SE's. Recommended labs to assess and monitor clinical status. Further disposition pending results of labs. Over 30 minutes of exam, counseling, chart review was performed

## 2015-07-20 ENCOUNTER — Other Ambulatory Visit: Payer: Self-pay | Admitting: Internal Medicine

## 2015-07-21 ENCOUNTER — Other Ambulatory Visit: Payer: Self-pay | Admitting: *Deleted

## 2015-07-21 MED ORDER — SERTRALINE HCL 50 MG PO TABS: 50.0000 mg | ORAL_TABLET | Freq: Every day | ORAL | Status: DC

## 2015-08-07 ENCOUNTER — Ambulatory Visit (INDEPENDENT_AMBULATORY_CARE_PROVIDER_SITE_OTHER): Payer: Medicare Other | Admitting: *Deleted

## 2015-08-07 DIAGNOSIS — I495 Sick sinus syndrome: Secondary | ICD-10-CM

## 2015-08-08 NOTE — Progress Notes (Signed)
Remote pacemaker transmission.   

## 2015-08-15 ENCOUNTER — Ambulatory Visit (INDEPENDENT_AMBULATORY_CARE_PROVIDER_SITE_OTHER): Payer: Medicare Other | Admitting: *Deleted

## 2015-08-15 DIAGNOSIS — I4891 Unspecified atrial fibrillation: Secondary | ICD-10-CM | POA: Diagnosis not present

## 2015-08-15 LAB — CUP PACEART REMOTE DEVICE CHECK
Battery Impedance: 415 Ohm
Battery Remaining Longevity: 90 mo
Battery Voltage: 2.8 V
Brady Statistic AP VS Percent: 77 %
Brady Statistic AS VS Percent: 23 %
Date Time Interrogation Session: 20161201144729
Implantable Lead Implant Date: 20111027
Implantable Lead Location: 753859
Implantable Lead Serial Number: 548229
Lead Channel Impedance Value: 493 Ohm
Lead Channel Sensing Intrinsic Amplitude: 1 mV
Lead Channel Sensing Intrinsic Amplitude: 5.6 mV
Lead Channel Setting Pacing Amplitude: 2.5 V
Lead Channel Setting Pacing Pulse Width: 0.4 ms
MDC IDC LEAD IMPLANT DT: 20111027
MDC IDC LEAD LOCATION: 753860
MDC IDC LEAD SERIAL: 686026
MDC IDC MSMT LEADCHNL RA PACING THRESHOLD AMPLITUDE: 0.375 V
MDC IDC MSMT LEADCHNL RA PACING THRESHOLD PULSEWIDTH: 0.4 ms
MDC IDC MSMT LEADCHNL RV IMPEDANCE VALUE: 431 Ohm
MDC IDC MSMT LEADCHNL RV PACING THRESHOLD AMPLITUDE: 0.75 V
MDC IDC MSMT LEADCHNL RV PACING THRESHOLD PULSEWIDTH: 0.4 ms
MDC IDC SET LEADCHNL RA PACING AMPLITUDE: 2 V
MDC IDC SET LEADCHNL RV SENSING SENSITIVITY: 2 mV
MDC IDC STAT BRADY AP VP PERCENT: 0 %
MDC IDC STAT BRADY AS VP PERCENT: 0 %

## 2015-08-15 LAB — POCT INR: INR: 1.8

## 2015-08-19 ENCOUNTER — Encounter: Payer: Self-pay | Admitting: Cardiology

## 2015-09-05 ENCOUNTER — Ambulatory Visit (INDEPENDENT_AMBULATORY_CARE_PROVIDER_SITE_OTHER): Payer: Medicare Other | Admitting: *Deleted

## 2015-09-05 DIAGNOSIS — I4891 Unspecified atrial fibrillation: Secondary | ICD-10-CM

## 2015-09-05 LAB — POCT INR: INR: 1.7

## 2015-09-11 DIAGNOSIS — H4720 Unspecified optic atrophy: Secondary | ICD-10-CM | POA: Diagnosis not present

## 2015-09-11 DIAGNOSIS — Z961 Presence of intraocular lens: Secondary | ICD-10-CM | POA: Diagnosis not present

## 2015-09-11 DIAGNOSIS — H52201 Unspecified astigmatism, right eye: Secondary | ICD-10-CM | POA: Diagnosis not present

## 2015-09-19 ENCOUNTER — Ambulatory Visit (INDEPENDENT_AMBULATORY_CARE_PROVIDER_SITE_OTHER): Payer: Medicare Other | Admitting: Pharmacist

## 2015-09-19 DIAGNOSIS — I4891 Unspecified atrial fibrillation: Secondary | ICD-10-CM | POA: Diagnosis not present

## 2015-09-19 LAB — POCT INR: INR: 2.6

## 2015-09-23 ENCOUNTER — Other Ambulatory Visit: Payer: Self-pay | Admitting: *Deleted

## 2015-09-23 MED ORDER — SERTRALINE HCL 50 MG PO TABS: 50.0000 mg | ORAL_TABLET | Freq: Every day | ORAL | Status: DC

## 2015-10-09 ENCOUNTER — Encounter: Payer: Self-pay | Admitting: Internal Medicine

## 2015-10-09 ENCOUNTER — Encounter: Payer: Self-pay | Admitting: Cardiology

## 2015-10-09 ENCOUNTER — Ambulatory Visit (INDEPENDENT_AMBULATORY_CARE_PROVIDER_SITE_OTHER): Payer: Medicare Other | Admitting: Internal Medicine

## 2015-10-09 ENCOUNTER — Ambulatory Visit (INDEPENDENT_AMBULATORY_CARE_PROVIDER_SITE_OTHER): Payer: Medicare Other | Admitting: Cardiology

## 2015-10-09 ENCOUNTER — Ambulatory Visit (INDEPENDENT_AMBULATORY_CARE_PROVIDER_SITE_OTHER): Payer: Medicare Other

## 2015-10-09 VITALS — BP 108/60 | HR 88 | Ht 61.0 in | Wt 149.0 lb

## 2015-10-09 VITALS — BP 138/80 | HR 88 | Temp 97.5°F | Resp 16 | Ht 61.0 in | Wt 150.0 lb

## 2015-10-09 DIAGNOSIS — K219 Gastro-esophageal reflux disease without esophagitis: Secondary | ICD-10-CM

## 2015-10-09 DIAGNOSIS — E559 Vitamin D deficiency, unspecified: Secondary | ICD-10-CM | POA: Diagnosis not present

## 2015-10-09 DIAGNOSIS — I495 Sick sinus syndrome: Secondary | ICD-10-CM | POA: Diagnosis not present

## 2015-10-09 DIAGNOSIS — E039 Hypothyroidism, unspecified: Secondary | ICD-10-CM | POA: Diagnosis not present

## 2015-10-09 DIAGNOSIS — E785 Hyperlipidemia, unspecified: Secondary | ICD-10-CM | POA: Diagnosis not present

## 2015-10-09 DIAGNOSIS — I480 Paroxysmal atrial fibrillation: Secondary | ICD-10-CM | POA: Diagnosis not present

## 2015-10-09 DIAGNOSIS — I4891 Unspecified atrial fibrillation: Secondary | ICD-10-CM | POA: Diagnosis not present

## 2015-10-09 DIAGNOSIS — Z79899 Other long term (current) drug therapy: Secondary | ICD-10-CM

## 2015-10-09 DIAGNOSIS — I350 Nonrheumatic aortic (valve) stenosis: Secondary | ICD-10-CM

## 2015-10-09 DIAGNOSIS — Z0001 Encounter for general adult medical examination with abnormal findings: Secondary | ICD-10-CM

## 2015-10-09 DIAGNOSIS — I10 Essential (primary) hypertension: Secondary | ICD-10-CM

## 2015-10-09 DIAGNOSIS — R7303 Prediabetes: Secondary | ICD-10-CM

## 2015-10-09 DIAGNOSIS — Z950 Presence of cardiac pacemaker: Secondary | ICD-10-CM

## 2015-10-09 DIAGNOSIS — Z0000 Encounter for general adult medical examination without abnormal findings: Secondary | ICD-10-CM

## 2015-10-09 DIAGNOSIS — Z1212 Encounter for screening for malignant neoplasm of rectum: Secondary | ICD-10-CM

## 2015-10-09 DIAGNOSIS — R7309 Other abnormal glucose: Secondary | ICD-10-CM | POA: Diagnosis not present

## 2015-10-09 LAB — URINALYSIS, ROUTINE W REFLEX MICROSCOPIC
Bilirubin Urine: NEGATIVE
GLUCOSE, UA: NEGATIVE
Hgb urine dipstick: NEGATIVE
Ketones, ur: NEGATIVE
LEUKOCYTES UA: NEGATIVE
Nitrite: NEGATIVE
PROTEIN: NEGATIVE
SPECIFIC GRAVITY, URINE: 1.009 (ref 1.001–1.035)
pH: 5 (ref 5.0–8.0)

## 2015-10-09 LAB — TSH: TSH: 1.771 u[IU]/mL (ref 0.350–4.500)

## 2015-10-09 LAB — HEPATIC FUNCTION PANEL
ALBUMIN: 3.8 g/dL (ref 3.6–5.1)
ALK PHOS: 128 U/L (ref 33–130)
ALT: 8 U/L (ref 6–29)
AST: 14 U/L (ref 10–35)
BILIRUBIN TOTAL: 0.4 mg/dL (ref 0.2–1.2)
Bilirubin, Direct: 0.1 mg/dL (ref <=0.2)
Indirect Bilirubin: 0.3 mg/dL (ref 0.2–1.2)
Total Protein: 6.4 g/dL (ref 6.1–8.1)

## 2015-10-09 LAB — CBC WITH DIFFERENTIAL/PLATELET
BASOS PCT: 1 % (ref 0–1)
Basophils Absolute: 0.1 10*3/uL (ref 0.0–0.1)
Eosinophils Absolute: 0.3 10*3/uL (ref 0.0–0.7)
Eosinophils Relative: 3 % (ref 0–5)
HCT: 37.7 % (ref 36.0–46.0)
HEMOGLOBIN: 11.8 g/dL — AB (ref 12.0–15.0)
Lymphocytes Relative: 18 % (ref 12–46)
Lymphs Abs: 1.6 10*3/uL (ref 0.7–4.0)
MCH: 27.5 pg (ref 26.0–34.0)
MCHC: 31.3 g/dL (ref 30.0–36.0)
MCV: 87.9 fL (ref 78.0–100.0)
MONOS PCT: 10 % (ref 3–12)
MPV: 10.3 fL (ref 8.6–12.4)
Monocytes Absolute: 0.9 10*3/uL (ref 0.1–1.0)
NEUTROS ABS: 5.9 10*3/uL (ref 1.7–7.7)
NEUTROS PCT: 68 % (ref 43–77)
PLATELETS: 261 10*3/uL (ref 150–400)
RBC: 4.29 MIL/uL (ref 3.87–5.11)
RDW: 14 % (ref 11.5–15.5)
WBC: 8.7 10*3/uL (ref 4.0–10.5)

## 2015-10-09 LAB — LIPID PANEL
CHOL/HDL RATIO: 6.3 ratio — AB (ref <=5.0)
Cholesterol: 183 mg/dL (ref 125–200)
HDL: 29 mg/dL — AB (ref >=46)
LDL CALC: 105 mg/dL (ref <130)
Triglycerides: 244 mg/dL — ABNORMAL HIGH (ref <150)
VLDL: 49 mg/dL — ABNORMAL HIGH (ref <30)

## 2015-10-09 LAB — BASIC METABOLIC PANEL WITH GFR
BUN: 26 mg/dL — AB (ref 7–25)
CHLORIDE: 109 mmol/L (ref 98–110)
CO2: 20 mmol/L (ref 20–31)
CREATININE: 1.24 mg/dL — AB (ref 0.60–0.93)
Calcium: 8.9 mg/dL (ref 8.6–10.4)
GFR, Est African American: 48 mL/min — ABNORMAL LOW (ref >=60)
GFR, Est Non African American: 41 mL/min — ABNORMAL LOW (ref >=60)
Glucose, Bld: 85 mg/dL (ref 65–99)
POTASSIUM: 4.4 mmol/L (ref 3.5–5.3)
Sodium: 139 mmol/L (ref 135–146)

## 2015-10-09 LAB — POCT INR: INR: 2.2

## 2015-10-09 LAB — MAGNESIUM: Magnesium: 1.8 mg/dL (ref 1.5–2.5)

## 2015-10-09 MED ORDER — RANITIDINE HCL 300 MG PO TABS: ORAL_TABLET | ORAL | Status: DC

## 2015-10-09 MED ORDER — DIGOXIN 125 MCG PO TABS: ORAL_TABLET | ORAL | Status: DC

## 2015-10-09 NOTE — Progress Notes (Signed)
Patient ID: [name], female   DOB: 02-22-1934, 82 y.o.   MRN: [id number]  Annual Screening/Preventative Visit And Comprehensive Evaluation &  Examination  This very nice 82 y.o. [name] presents for presents for a Wellness/Preventative Visit & comprehensive evaluation and management of multiple medical co-morbidities.  Patient has been followed for HTN, T2_NIDDM  Prediabetes, Hyperlipidemia and Vitamin D Deficiency.    HTN predates circa 1960's. Patient's BP has been controlled at home and patient denies any cardiac symptoms as chest pain, palpitations, shortness of breath, dizziness or ankle swelling. Today's BP: 138/80 mmHg Patient has ASHD with hx/o CHF and had PCM in Oct 2011 and has been followed closely by Dr Mare Ferrari. 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 Patient is on coumadin followed at the [facility] Coumad[facility] clinic.    Patient's hyperlipidemia is not controlled with diet. Patient denies myalgias or other medication SE's. Last lipids were not at goal with Cholesterol 194; HDL 29*; LDL 115;  And elevated Triglycerides 251 on 07/18/2015.   Patient has prediabetes predating since 2011 with A1c 5.7% and then 6.1% in 2012 and 5.7% in 2013. Patient denies reactive hypoglycemic symptoms, visual blurring, diabetic polys, or paresthesias. Last A1c was  5.5% on 07/18/2015.   Finally, patient has history of Vitamin D Deficiency of "43" in 2008 and last Vitamin D was 59 on 07/18/2015.    Medication Sig  . ALPRAZolam  0.5 MG tablet TAKE 1/2 TO 1 TABLET BY MOUTH 3 TIMES A DAY AS NEEDED FOR ANXIETY  . atenolol  100 MG tablet Take 1 tablet (100 mg total) by mouth 2 (two) times daily.  . Biotin 5000 MCG CAPS Take 10,000 mcg by mouth every morning.   [name] VITAMIN D  Take 5,000 Units by mouth every morning.   . furosemide  40 MG tablet Take 40 mg by mouth daily. Takes 1/2 - 1 pill QD AD  . KLOR-CON 20 MEQ tablet TAKE 1 TABLET BY MOUTH THREE  TIMES A DAY  . Levothyroxine 100 MCG tablet Take 100 mcg by mouth daily before breakfast.  . losartan  100 MG tablet TAKE 1 TABLET (100 MG TOTAL) BY MOUTH DAILY.  . methocarbamol  500 MG tablet Take 1 tablet (500 mg total) by mouth every 6 (six) hours as needed for muscle spasms.  [name] NITROSTAT 0.4 MG SL tablet Place 1 tablet (0.4 mg total) under the tongue every 5 (five) minutes as needed for chest pain (MAX 3 TABLETS).  [name] K-DUR  20 MEQ tablet Take 20 mEq by mouth 2 (two) times daily.  . pregabalin (LYRICA) 75 MG  Take 1 capsule (75 mg total) by mouth 2 (two) times daily.  . ranitidine (ZANTAC) 150 MG  Take 150 mg by mouth 2 (two) times daily.   . sertraline (ZOLOFT) 50 MG  Take 1 tablet (50 mg total) by mouth daily. For mood  . traMADol (ULTRAM) 50 MG  Take 50 mg by mouth every 6 (six) hours as needed (pain).  . triamcinolone cream  0.1 % APPLY TO AFFECTED AREA TWICE A DAY  . warfarin  2 MG  Take as directed   Allergies  Allergen Reactions  . Codeine Nausea And Vomiting  . Gabapentin Itching  . Hydrocodone Other (See Comments)    hallucination  . Morphine And Related Nausea And Vomiting  . Requip [Ropinirole Hcl]     Headache   . Zinc  nausea   Past Medical History  Diagnosis Date  . PAF (paroxysmal atrial fibrillation) ([place])     controlled with amiodarone, on coumadin  . CAD (coronary artery disease)   . Aortic root dilatation ([place])   . Moderate aortic stenosis   . Chronic anticoagulation   . Tachycardia-bradycardia syndrome ([place])     s/p PPM by Dr Doreatha Lew ([facility]) 07/03/10  . GERD (gastroesophageal reflux disease)   . Pacemaker     2012  . Neuropathy ([place])     feet/legs  . Restless leg syndrome   . Headache(784.0)     hx migraines - takes Zoloft for migraines  . History of uterine cancer 1980s    treated with hysterectomy, external radiation and radiation seed implants.   . Cancer ([place])     hx Kidney cancer / hx of endometrial cancer  . PONV (postoperative nausea and  vomiting)   . Anxiety   . Heart murmur   . HTN (hypertension)   . Hyperlipemia   . Hypothyroidism   . Osteomyelitis ([place]) as child  . Family history of anesthesia complication     daughter has ponv  . Syncope 05/28/2014  . Hepatic steatosis   . Elevated LFTs   . Gastroparesis   . Anal fissure   . Radiation proctitis   . Gastric polyp     adenomatous  . Neuromuscular disorder (HCC)     neuropathy in right leg  . Chronic kidney disease     only has one kidney   . 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 DEXA SCAN  02/11/2001  . TETANUS/TDAP  09/06/2012  . INFLUENZA VACCINE  04/06/2016  . COLONOSCOPY  08/10/2016  . ZOSTAVAX  Completed  . PNA vac Low Risk Adult  Completed   Immunization History  Administered Date(s) Administered  . Influenza, High Dose Seasonal PF 07/18/2015  . Influenza,inj,Quad PF,36+ Mos 05/31/2014  . Influenza-Unspecified 09/16/2009, 07/03/2013  . Pneumococcal Conjugate-13 09/06/2002  . Pneumococcal Polysaccharide-23 07/18/2015  . Td 09/06/2002  . Zoster 09/06/2008   Past Surgical History  Procedure Laterality Date  . Cardiac catheterization  2008  . Nephrectomy Left     renal cell cancer  . Total abdominal hysterectomy    . Patent ductus arterious repair  age 65  . Ptvp    . Pacemaker insertion  07/04/11    by Dr Doreatha Lew  . Cholecystectomy  1970s  . Upper gastrointestinal endoscopy    . Total knee arthroplasty  02/07/2012    Procedure: TOTAL KNEE ARTHROPLASTY;  Surgeon: Valerie Pole, MD;  Location: [facility];  Service: Orthopedics;  Laterality: Right;  . Insert / replace / remove pacemaker    . Total hip revision Right 2009  . I&d knee with poly exchange Right 12/17/2013    Procedure: IRRIGATION AND DEBRIDEMEN RIGHT TOTAL  KNEE WITH POLY EXCHANGE;  Surgeon: Valerie Pole, MD;  Location: [facility];  Service: Orthopedics;  Laterality: Right;  . Patellar tendon repair Right 03/06/2014    Procedure: AVULSION WITH PATELLA TENDON  REPAIR;  Surgeon: Valerie Pole, MD;  Location: [facility];  Service: Orthopedics;  Laterality: Right;  . Total knee revision Right 07/29/2014    Procedure: RIGHT KNEE REVISION OF PREVIOUS REPAIR EXTENSOR MECHANISM;  Surgeon: Valerie Pole, MD;  Location: [facility];  Service: Orthopedics;  Laterality: Right;   Family History  Problem Relation Age of Onset  . Colon cancer Mother 30  . Heart  disease Mother   . Heart disease Father   . Heart attack Father   . Heart disease Maternal Grandmother    Social History  Substance Use Topics  . Smoking status: Never Smoker   . Smokeless tobacco: Never Used  . Alcohol Use: No    ROS Constitutional: Denies fever, chills, weight loss/gain, headaches, insomnia,  night sweats, and change in appetite. Does c/o fatigue. Eyes: Denies redness, blurred vision, diplopia, discharge, itchy, watery eyes.  ENT: Denies discharge, congestion, post nasal drip, epistaxis, sore throat, earache, hearing loss, dental pain, Tinnitus, Vertigo, Sinus pain, snoring.  Cardio: Denies chest pain, palpitations, irregular heartbeat, syncope, dyspnea, diaphoresis, orthopnea, PND, claudication, edema Respiratory: denies cough, dyspnea, DOE, pleurisy, hoarseness, laryngitis, wheezing.  Gastrointestinal: Denies dysphagia, heartburn, reflux, water brash, pain, cramps, nausea, vomiting, bloating, diarrhea, constipation, hematemesis, melena, hematochezia, jaundice, hemorrhoids Genitourinary: Denies dysuria, frequency, urgency, nocturia, hesitancy, discharge, hematuria, flank pain Breast: Breast lumps, nipple discharge, bleeding.  Musculoskeletal: Denies arthralgia, myalgia, stiffness, Jt. Swelling, pain, limp, and strain/sprain. Denies falls. Skin: Denies puritis, rash, hives, warts, acne, eczema, changing in skin lesion Neuro: No weakness, tremor, incoordination, spasms, paresthesia, pain Psychiatric: Denies confusion, memory loss, sensory loss. Denies Depression. Endocrine: Denies change  in weight, skin, hair change, nocturia, and paresthesia, diabetic polys, visual blurring, hyper / hypo glycemic episodes.  Heme/Lymph: No excessive bleeding, bruising, enlarged lymph nodes.  Physical Exam  BP 138/80 mmHg  Pulse 88  Temp(Src) 97.5 F (36.4 C)  Resp 16  Ht 5\' 1"  (1.549 m)  Wt 150 lb (68.04 kg)  BMI 28.36 kg/m2  LMP  (Exact Date)  General Appearance: Well nourished and in no apparent distress. Eyes: PERRLA, EOMs, conjunctiva no swelling or erythema, normal fundi and vessels. Sinuses: No frontal/maxillary tenderness ENT/Mouth: EACs patent / TMs  nl. Nares clear without erythema, swelling, mucoid exudates. Oral hygiene is good. No erythema, swelling, or exudate. Tongue normal, non-obstructing. Tonsils not swollen or erythematous. Hearing normal.  Neck: Supple, thyroid normal. No bruits, nodes or JVD. Respiratory: Respiratory effort normal.  BS equal and clear bilateral without rales, rhonci, wheezing or stridor. Cardio: Heart sounds are normal with regular rate and rhythm and no murmurs, rubs or gallops. Peripheral pulses are normal and equal bilaterally without edema. No aortic or femoral bruits. Chest: symmetric with normal excursions and percussion. Breasts: Symmetric, without lumps, nipple discharge, retractions, or fibrocystic changes.  Abdomen: Flat, soft, with bowl sounds. Nontender, no guarding, rebound, hernias, masses, or organomegaly.  Lymphatics: Non tender without lymphadenopathy.  Genitourinary:  Musculoskeletal: Full ROM all peripheral extremities, joint stability, 5/5 strength, and normal gait. Skin: Warm and dry without rashes, lesions, cyanosis, clubbing or  ecchymosis.  Neuro: Cranial nerves intact, reflexes equal bilaterally. Normal muscle tone, no cerebellar symptoms. Sensation intact.  Pysch: Alert and oriented X 3, normal affect, Insight and Judgment appropriate.   Assessment and Plan  1. Annual Preventative Screening Examination  - [place],  RETROPERITNL ABD,  LTD - POC Hemoccult Bld/Stl ( - Urinalysis, Routine w reflex microscopic  - CBC with Differential/Platelet - BASIC METABOLIC PANEL WITH GFR - Hepatic function panel - Magnesium - Lipid panel - TSH - Hemoglobin A1c - Insulin, random - VITAMIN D 25 Hydroxy   2. Essential hypertension  - [place], RETROPERITNL ABD,  LTD - TSH  3. Hyperlipemia  - Lipid panel - TSH  4. Prediabetes  - Hemoglobin A1c - Insulin, random  5. Vitamin D deficiency  - VITAMIN D 25 Hydroxy   6. Hypothyroidism  - TSH  7. Atrial fibrillation, unspecified type ([place])   8. Cardiac pacemaker (06/2010)   9. Screening for rectal cancer  - POC Hemoccult Bld/Stl   10. Medication management  - Urinalysis, Routine w reflex microscopic  - CBC with Differential/Platelet - BASIC METABOLIC PANEL WITH GFR - Hepatic function panel - Magnesium   Continue prudent diet as discussed, weight control, BP monitoring, regular exercise, and medications. Discussed med's effects and SE's. Screening labs and tests as requested with regular follow-up as recommended. Over 40 minutes of exam, counseling, chart review and high complex critical decision making was performed.

## 2015-10-09 NOTE — Patient Instructions (Signed)
Recommend Adult Low Dose Aspirin or   coated  Aspirin 81 mg daily   To reduce risk of Colon Cancer 20 %,   Skin Cancer 26 % ,   Melanoma 46%   and   Pancreatic cancer 60%   ++++++++++++++++++++++++++++++++++++++++++++++++++++++ Vitamin D goal   is between 70-100.   Please make sure that you are taking your Vitamin D as directed.   It is very important as a natural anti-inflammatory   helping hair, skin, and nails, as well as reducing stroke and heart attack risk.   It helps your bones and helps with mood.  It also decreases numerous cancer risks so please take it as directed.   Low Vit D is associated with a 200-300% higher risk for CANCER   and 200-300% higher risk for HEART   ATTACK  &  STROKE.   .....................................[name]  It is also associated with higher death rate at younger ages,   autoimmune diseases like Rheumatoid arthritis, Lupus, Multiple Sclerosis.     Also many other serious conditions, like depression, Alzheimer's  Dementia, infertility, muscle aches, fatigue, fibromyalgia - just to name a few.  ++++++++++++++++++++++++++++++++++++++++++++++++  Recommend the book "The END of DIETING" by Dr Excell Seltzer   & the book "The END of DIABETES " by Dr Excell Seltzer  At [email or web address] - get book & Audio CD's     Being diabetic has a  300% increased risk for heart attack, stroke, cancer, and alzheimer- type vascular dementia. It is very important that you work harder with diet by avoiding all foods that are white. Avoid white rice (brown & wild rice is OK), white potatoes (sweetpotatoes in moderation is OK), White bread or wheat bread or anything made out of white flour like bagels, donuts, rolls, buns, biscuits, cakes, pastries, cookies, pizza crust, and pasta (made from white flour & egg whites) - vegetarian pasta or spinach or wheat pasta is OK. Multigrain breads like Arnold's or Pepperidge [employer], or multigrain sandwich thins or flatbreads.  Diet,  exercise and weight loss can reverse and cure diabetes in the early stages.  Diet, exercise and weight loss is very important in the control and prevention of complications of diabetes which affects every system in your body, ie. Brain - dementia/stroke, eyes - glaucoma/blindness, heart - heart attack/heart failure, kidneys - dialysis, stomach - gastric paralysis, intestines - malabsorption, nerves - severe painful neuritis, circulation - gangrene & loss of a leg(s), and finally cancer and Alzheimers.    I recommend avoid fried & greasy foods,  sweets/candy, white rice (brown or wild rice or Quinoa is OK), white potatoes (sweet potatoes are OK) - anything made from white flour - bagels, doughnuts, rolls, buns, biscuits,white and wheat breads, pizza crust and traditional pasta made of white flour & egg white(vegetarian pasta or spinach or wheat pasta is OK).  Multi-grain bread is OK - like multi-grain flat bread or sandwich thins. Avoid alcohol in excess. Exercise is also important.    Eat all the vegetables you want - avoid meat, especially red meat and dairy - especially cheese.  Cheese is the most concentrated form of trans-fats which is the worst thing to clog up our arteries. Veggie cheese is OK which can be found in the fresh produce section at [employer] or [employer] or [employer]  ++++++++++++++++++++++++++++++++++++++++++++++++++ DASH Eating Plan  DASH stands for "Dietary Approaches to Stop Hypertension."   The DASH eating plan is a healthy eating plan that has been shown to reduce high blood  pressure (hypertension). Additional health benefits may include reducing the risk of type 2 diabetes mellitus, heart disease, and stroke. The DASH eating plan may also help with weight loss.  WHAT DO I NEED TO KNOW ABOUT THE DASH EATING PLAN?  For the DASH eating plan, you will follow these general guidelines:  Choose foods with a percent daily value for sodium of less than 5% (as listed on the food  label).  Use salt-free seasonings or herbs instead of table salt or sea salt.  Check with your health care provider or pharmacist before using salt substitutes.  Eat lower-sodium products, often labeled as "lower sodium" or "no salt added."  Eat fresh foods.  Eat more vegetables, fruits, and low-fat dairy products.    Choose whole grains. Look for the word "whole" as the first word in the ingredient list.  Choose fish   Limit sweets, desserts, sugars, and sugary drinks.  Choose heart-healthy fats.  Eat veggie cheese   Eat more home-cooked food and less restaurant, buffet, and fast food.  Limit fried foods.  Cook foods using methods other than frying.  Limit canned vegetables. If you do use them, rinse them well to decrease the sodium.  When eating at a restaurant, ask that your food be prepared with less salt, or no salt if possible.                      WHAT FOODS CAN I EAT?  Read Dr Fara Olden Fuhrman's books on The End of Dieting & The End of Diabetes  Grains  Whole grain or whole wheat bread. Brown rice. Whole grain or whole wheat pasta. Quinoa, bulgur, and whole grain cereals. Low-sodium cereals. Corn or whole wheat flour tortillas. Whole grain cornbread. Whole grain crackers. Low-sodium crackers.  Vegetables  Fresh or frozen vegetables (raw, steamed, roasted, or grilled). Low-sodium or reduced-sodium tomato and vegetable juices. Low-sodium or reduced-sodium tomato sauce and paste. Low-sodium or reduced-sodium canned vegetables.   Fruits  All fresh, canned (in natural juice), or frozen fruits.  Protein Products   All fish and seafood.  Dried beans, peas, or lentils. Unsalted nuts and seeds. Unsalted canned beans.  Dairy  Low-fat dairy products, such as skim or 1% milk, 2% or reduced-fat cheeses, low-fat ricotta or cottage cheese, or plain low-fat yogurt. Low-sodium or reduced-sodium cheeses.  Fats and Oils  Tub margarines without trans fats. Light or  reduced-fat mayonnaise and salad dressings (reduced sodium). Avocado. Safflower, olive, or canola oils. Natural peanut or almond butter.  Other  Unsalted popcorn and pretzels. The items listed above may not be a complete list of recommended foods or beverages. Contact your dietitian for more options.  +++++++++++++++++++++++++++++++++++++++++++  WHAT FOODS ARE NOT RECOMMENDED?  Grains/ White flour or wheat flour  White bread. White pasta. White rice. Refined cornbread. Bagels and croissants. Crackers that contain trans fat.  Vegetables  Creamed or fried vegetables. Vegetables in a . Regular canned vegetables. Regular canned tomato sauce and paste. Regular tomato and vegetable juices.  Fruits  Dried fruits. Canned fruit in light or heavy syrup. Fruit juice.  Meat and Other Protein Products  Meat in general - RED mwaet & White meat.  Fatty cuts of meat. Ribs, chicken wings, bacon, sausage, bologna, salami, chitterlings, fatback, hot dogs, bratwurst, and packaged luncheon meats.  Dairy  Whole or 2% milk, cream, half-and-half, and cream cheese. Whole-fat or sweetened yogurt. Full-fat cheeses or blue cheese. Nondairy creamers and whipped toppings. Processed cheese, cheese spreads, or  cheese curds.  Condiments  Onion and garlic salt, seasoned salt, table salt, and sea salt. Canned and packaged gravies. Worcestershire sauce. Tartar sauce. Barbecue sauce. Teriyaki sauce. Soy sauce, including reduced sodium. Steak sauce. Fish sauce. Oyster sauce. Cocktail sauce. Horseradish. Ketchup and mustard. Meat flavorings and tenderizers. Bouillon cubes. Hot sauce. Tabasco sauce. Marinades. Taco seasonings. Relishes.  Fats and Oils Butter, stick margarine, lard, shortening and bacon fat. Coconut, palm kernel, or palm oils. Regular salad dressings.  Pickles and olives. Salted popcorn and pretzels.  The items listed above may not be a complete list of foods and beverages to avoid.   Preventive  Care for Adults  A healthy lifestyle and preventive care can promote health and wellness. Preventive health guidelines for women include the following key practices.  A routine yearly physical is a good way to check with your health care provider about your health and preventive screening. It is a chance to share any concerns and updates on your health and to receive a thorough exam.  Visit your dentist for a routine exam and preventive care every 6 months. Brush your teeth twice a day and floss once a day. Good oral hygiene prevents tooth decay and gum disease.  The frequency of eye exams is based on your age, health, family medical history, use of contact lenses, and other factors. Follow your health care provider's recommendations for frequency of eye exams.  Eat a healthy diet. Foods like vegetables, fruits, whole grains, low-fat dairy products, and lean protein foods contain the nutrients you need without too many calories. Decrease your intake of foods high in solid fats, added sugars, and salt. Eat the right amount of calories for you.Get information about a proper diet from your health care provider, if necessary.  Regular physical exercise is one of the most important things you can do for your health. Most adults should get at least 150 minutes of moderate-intensity exercise (any activity that increases your heart rate and causes you to sweat) each week. In addition, most adults need muscle-strengthening exercises on 2 or more days a week.  Maintain a healthy weight. The body mass index (BMI) is a screening tool to identify possible weight problems. It provides an estimate of body fat based on height and weight. Your health care provider can find your BMI and can help you achieve or maintain a healthy weight.For adults 20 years and older:  A BMI below 18.5 is considered underweight.  A BMI of 18.5 to 24.9 is normal.  A BMI of 25 to 29.9 is considered overweight.  A BMI of 30 and  above is considered obese.  Maintain normal blood lipids and cholesterol levels by exercising and minimizing your intake of saturated fat. Eat a balanced diet with plenty of fruit and vegetables. If your lipid or cholesterol levels are high, you are over 50, or you are at high risk for heart disease, you may need your cholesterol levels checked more frequently.Ongoing high lipid and cholesterol levels should be treated with medicines if diet and exercise are not working.  If you smoke, find out from your health care provider how to quit. If you do not use tobacco, do not start.  Lung cancer screening is recommended for adults aged 34-80 years who are at high risk for developing lung cancer because of a history of smoking. A yearly low-dose CT scan of the lungs is recommended for people who have at least a 30-pack-year history of smoking and are a current smoker or  have quit within the past 15 years. A pack year of smoking is smoking an average of 1 pack of cigarettes a day for 1 year (for example: 1 pack a day for 30 years or 2 packs a day for 15 years). Yearly screening should continue until the smoker has stopped smoking for at least 15 years. Yearly screening should be stopped for people who develop a health problem that would prevent them from having lung cancer treatment.  Avoid use of street drugs. Do not share needles with anyone. Ask for help if you need support or instructions about stopping the use of drugs.  High blood pressure causes heart disease and increases the risk of stroke.  Ongoing high blood pressure should be treated with medicines if weight loss and exercise do not work.  If you are 52-67 years old, ask your health care provider if you should take aspirin to prevent strokes.  Diabetes screening involves taking a blood sample to check your fasting blood sugar level. This should be done once every 3 years, after age 9, if you are within normal weight and without risk factors for  diabetes. Testing should be considered at a younger age or be carried out more frequently if you are overweight and have at least 1 risk factor for diabetes.  Breast cancer screening is essential preventive care for women. You should practice "breast self-awareness." This means understanding the normal appearance and feel of your breasts and may include breast self-examination. Any changes detected, no matter how small, should be reported to a health care provider. Women in their 45s and 30s should have a clinical breast exam (CBE) by a health care provider as part of a regular health exam every 1 to 3 years. After age 76, women should have a CBE every year. Starting at age 54, women should consider having a mammogram (breast X-ray test) every year. Women who have a family history of breast cancer should talk to their health care provider about genetic screening. Women at a high risk of breast cancer should talk to their health care providers about having an MRI and a mammogram every year.  Breast cancer gene (BRCA)-related cancer risk assessment is recommended for women who have family members with BRCA-related cancers. BRCA-related cancers include breast, ovarian, tubal, and peritoneal cancers. Having family members with these cancers may be associated with an increased risk for harmful changes (mutations) in the breast cancer genes BRCA1 and BRCA2. Results of the assessment will determine the need for genetic counseling and BRCA1 and BRCA2 testing.  Routine pelvic exams to screen for cancer are no longer recommended for nonpregnant women who are considered low risk for cancer of the pelvic organs (ovaries, uterus, and vagina) and who do not have symptoms. Ask your health care provider if a screening pelvic exam is right for you.  If you have had past treatment for cervical cancer or a condition that could lead to cancer, you need Pap tests and screening for cancer for at least 20 years after your  treatment. If Pap tests have been discontinued, your risk factors (such as having a new sexual partner) need to be reassessed to determine if screening should be resumed. Some women have medical problems that increase the chance of getting cervical cancer. In these cases, your health care provider may recommend more frequent screening and Pap tests.    Colorectal cancer can be detected and often prevented. Most routine colorectal cancer screening begins at the age of 73 years and  continues through age 75 years. However, your health care provider may recommend screening at an earlier age if you have risk factors for colon cancer. On a yearly basis, your health care provider may provide home test kits to check for hidden blood in the stool. Use of a small camera at the end of a tube, to directly examine the colon (sigmoidoscopy or colonoscopy), can detect the earliest forms of colorectal cancer. Talk to your health care provider about this at age 50, when routine screening begins. Direct exam of the colon should be repeated every 5-10 years through age 75 years, unless early forms of pre-cancerous polyps or small growths are found.  Osteoporosis is a disease in which the bones lose minerals and strength with aging. This can result in serious bone fractures or breaks. The risk of osteoporosis can be identified using a bone density scan. Women ages 65 years and over and women at risk for fractures or osteoporosis should discuss screening with their health care providers. Ask your health care provider whether you should take a calcium supplement or vitamin D to reduce the rate of osteoporosis.  Menopause can be associated with physical symptoms and risks. Hormone replacement therapy is available to decrease symptoms and risks. You should talk to your health care provider about whether hormone replacement therapy is right for you.  Use sunscreen. Apply sunscreen liberally and repeatedly throughout the day. You  should seek shade when your shadow is shorter than you. Protect yourself by wearing long sleeves, pants, a wide-brimmed hat, and sunglasses year round, whenever you are outdoors.  Once a month, do a whole body skin exam, using a mirror to look at the skin on your back. Tell your health care provider of new moles, moles that have irregular borders, moles that are larger than a pencil eraser, or moles that have changed in shape or color.  Stay current with required vaccines (immunizations).  Influenza vaccine. All adults should be immunized every year.  Tetanus, diphtheria, and acellular pertussis (Td, Tdap) vaccine. Pregnant women should receive 1 dose of Tdap vaccine during each pregnancy. The dose should be obtained regardless of the length of time since the last dose. Immunization is preferred during the 27th-36th week of gestation. An adult who has not previously received Tdap or who does not know her vaccine status should receive 1 dose of Tdap. This initial dose should be followed by tetanus and diphtheria toxoids (Td) booster doses every 10 years. Adults with an unknown or incomplete history of completing a 3-dose immunization series with Td-containing vaccines should begin or complete a primary immunization series including a Tdap dose. Adults should receive a Td booster every 10 years.    Zoster vaccine. One dose is recommended for adults aged 60 years or older unless certain conditions are present.    Pneumococcal 13-valent conjugate (PCV13) vaccine. When indicated, a person who is uncertain of her immunization history and has no record of immunization should receive the PCV13 vaccine. An adult aged 19 years or older who has certain medical conditions and has not been previously immunized should receive 1 dose of PCV13 vaccine. This PCV13 should be followed with a dose of pneumococcal polysaccharide (PPSV23) vaccine. The PPSV23 vaccine dose should be obtained at least 8 weeks after the dose  of PCV13 vaccine. An adult aged 19 years or older who has certain medical conditions and previously received 1 or more doses of PPSV23 vaccine should receive 1 dose of PCV13. The PCV13 vaccine dose should   be obtained 1 or more years after the last PPSV23 vaccine dose.    Pneumococcal polysaccharide (PPSV23) vaccine. When PCV13 is also indicated, PCV13 should be obtained first. All adults aged 39 years and older should be immunized. An adult younger than age 68 years who has certain medical conditions should be immunized. Any person who resides in a nursing home or long-term care facility should be immunized. An adult smoker should be immunized. People with an immunocompromised condition and certain other conditions should receive both PCV13 and PPSV23 vaccines. People with human immunodeficiency virus (HIV) infection should be immunized as soon as possible after diagnosis. Immunization during chemotherapy or radiation therapy should be avoided. Routine use of PPSV23 vaccine is not recommended for American Indians, [employer]atives, or people younger than 65 years unless there are medical conditions that require PPSV23 vaccine. When indicated, people who have unknown immunization and have no record of immunization should receive PPSV23 vaccine. One-time revaccination 5 years after the first dose of PPSV23 is recommended for people aged 19-64 years who have chronic kidney failure, nephrotic syndrome, asplenia, or immunocompromised conditions. People who received 1-2 doses of PPSV23 before age 82 years should receive another dose of PPSV23 vaccine at age 2 years or later if at least 5 years have passed since the previous dose. Doses of PPSV23 are not needed for people immunized with PPSV23 at or after age 61 years.   Preventive Services / Frequency  Ages 50 years and over  Blood pressure check.  Lipid and cholesterol check.  Lung cancer screening. / Every year if you are aged 43-80 years and have a  30-pack-year history of smoking and currently smoke or have quit within the past 15 years. Yearly screening is stopped once you have quit smoking for at least 15 years or develop a health problem that would prevent you from having lung cancer treatment.  Clinical breast exam.** / Every year after age 2 years.  BRCA-related cancer risk assessment.** / For women who have family members with a BRCA-related cancer (breast, ovarian, tubal, or peritoneal cancers).  Mammogram.** / Every year beginning at age 64 years and continuing for as long as you are in good health. Consult with your health care provider.  Pap test.** / Every 3 years starting at age 54 years through age 94 or 52 years with 3 consecutive normal Pap tests. Testing can be stopped between 65 and 70 years with 3 consecutive normal Pap tests and no abnormal Pap or HPV tests in the past 10 years.  Fecal occult blood test (FOBT) of stool. / Every year beginning at age 69 years and continuing until age 64 years. You may not need to do this test if you get a colonoscopy every 10 years.  Flexible sigmoidoscopy or colonoscopy.** / Every 5 years for a flexible sigmoidoscopy or every 10 years for a colonoscopy beginning at age 20 years and continuing until age 77 years.  Hepatitis C blood test.** / For all people born from [birth year] through 1963 and any individual with known risks for hepatitis C.  Osteoporosis screening.** / A one-time screening for women ages 31 years and over and women at risk for fractures or osteoporosis.  Skin self-exam. / Monthly.  Influenza vaccine. / Every year.  Tetanus, diphtheria, and acellular pertussis (Tdap/Td) vaccine.** / 1 dose of Td every 10 years.  Zoster vaccine.** / 1 dose for adults aged 21 years or older.  Pneumococcal 13-valent conjugate (PCV13) vaccine.** / Consult your health care provider.  Pneumococcal polysaccharide (PPSV23) vaccine.** / 1 dose for all adults aged 68 years and older. Screening  for abdominal aortic aneurysm (AAA)  by ultrasound is recommended for people who have history of high blood pressure or who are current or former smokers.

## 2015-10-09 NOTE — Patient Instructions (Addendum)
Medication Instructions:  START DIGOXIN 0.125 MG 1/2 TABLET DAILY  Labwork: NONE  Testing/Procedures: Your physician has requested that you have an echocardiogram. Echocardiography is a painless test that uses sound waves to create images of your heart. It provides your doctor with information about the size and shape of your heart and how well your heart's chambers and valves are working. This procedure takes approximately one hour. There are no restrictions for this procedure.  Follow-Up: Your physician recommends that you schedule a follow-up appointment in: 3 MONTH OV/EKG WITH DR [name] [facility]   WILL REFER YOU TO [place]   If you need a refill on your cardiac medications before your next appointment, please call your pharmacy.

## 2015-10-09 NOTE — Progress Notes (Signed)
Cardiology Office Note   Date:  10/09/2015   ID:  [name], DOB [birth year], MRN [id number]  PCP:  [name], MD  Cardiologist: Darlin Coco MD  Chief Complaint  Patient presents with  . Shortness of Breath    feels as if "heart is out of rhythm"      History of Present Illness: [name] is a 82 y.o. female who presents for  Scheduled four-month follow-up visit  She has a past history of tachybradycardia syndrome . She has a permanent pacemaker implanted 07/01/10 By Dr. Rayann Heman . She has a remote history of repair of a patent ductus arteriosus. She has a history of moderate valvular aortic stenosis. She has had a history of congestive heart failure. Most recent admission was 08/25/12-08/29/12 for exacerbation of CHF. She was hospitalized again in November 2015 for congestive heart failure and pneumonia. Her last echocardiogram 08/26/12 showed ejection fraction of 60-65% with grade 2 diastolic dysfunction and a peak aortic valve gradient of 29 with a mean aortic valve gradient of 14. Her more recent echocardiogram in September 2015 showed ejection fraction of 55-60%. The aortic valve showed mild stenosis and moderate regurgitation.  She has had a total of 3 operations on her right leg. Dr. Alvan Dame is her orthopedist. 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 She had carotid Dopplers which did not show any significant obstruction.  since last visit she states that she has essentially been staying in atrial fibrillation instead of going in and out of atrial fibrillation. At her last visit in an effort to keep her in sinus rhythm we had increased her atenolol. She is in atrial fibrillation she has a lot of exertional dyspnea and her blood pressure is lower then otherwise. She has not been experiencing any chest pain.  He has a past history of intolerance to amiodarone because of  thyroid hepatic and renal issues.  Past Medical History  Diagnosis Date  . PAF (paroxysmal atrial fibrillation) ([place])     controlled with amiodarone, on coumadin  . CAD (coronary artery disease)   . Aortic root dilatation ([place])   . Moderate aortic stenosis   . Chronic anticoagulation   . Tachycardia-bradycardia syndrome ([place])     s/p PPM by Dr Doreatha Lew (MDT) 07/03/10  . GERD (gastroesophageal reflux disease)   . Pacemaker     2012  . Neuropathy ([place])     feet/legs  . Restless leg syndrome   . Headache(784.0)     hx migraines - takes Zoloft for migraines  . History of uterine cancer 1980s    treated with hysterectomy, external radiation and radiation seed implants.   . Cancer ([place])     hx Kidney cancer / hx of endometrial cancer  . PONV (postoperative nausea and vomiting)   . Anxiety   . Heart murmur   . HTN (hypertension)   . Hyperlipemia   . Hypothyroidism   . Osteomyelitis ([place]) as child  . Family history of anesthesia complication     daughter has ponv  . Syncope 05/28/2014  . Hepatic steatosis   . Elevated LFTs   . Gastroparesis   . Anal fissure   . Radiation proctitis   . Gastric polyp     adenomatous  . Neuromuscular disorder (HCC)     neuropathy in right leg  . Chronic kidney disease     only has one kidney   .  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 Cardiac catheterization  2008  . Nephrectomy Left     renal cell cancer  . Total abdominal hysterectomy    . Patent ductus arterious repair  age 36  . Ptvp    . Pacemaker insertion  07/04/11    by Dr Doreatha Lew  . Cholecystectomy  1970s  . Upper gastrointestinal endoscopy    . Total knee arthroplasty  02/07/2012    Procedure: TOTAL KNEE ARTHROPLASTY;  Surgeon: Mauri Pole, MD;  Location: [facility];  Service: Orthopedics;  Laterality: Right;  . Insert / replace / remove pacemaker    . Total hip revision Right 2009  . I&d knee with poly exchange Right 12/17/2013     Procedure: IRRIGATION AND DEBRIDEMEN RIGHT TOTAL  KNEE WITH POLY EXCHANGE;  Surgeon: Mauri Pole, MD;  Location: [facility];  Service: Orthopedics;  Laterality: Right;  . Patellar tendon repair Right 03/06/2014    Procedure: AVULSION WITH PATELLA TENDON REPAIR;  Surgeon: Mauri Pole, MD;  [facility]: [facility];  Service: Orthopedics;  Laterality: Right;  . Total knee revision Right 07/29/2014    Procedure: RIGHT KNEE REVISION OF PREVIOUS REPAIR EXTENSOR MECHANISM;  Surgeon: Mauri Pole, MD;  [facility];  Service: Orthopedics;  Laterality: Right;     Current Outpatient Prescriptions  Medication Sig Dispense Refill  . ALPRAZolam (XANAX) 0.5 MG tablet TAKE 1/2 TO 1 TABLET BY MOUTH 3 TIMES A DAY AS NEEDED FOR ANXIETY 90 tablet 5  . atenolol (TENORMIN) 100 MG tablet Take 1 tablet (100 mg total) by mouth 2 (two) times daily. 60 tablet 11  . Biotin 5000 MCG CAPS Take 10,000 mcg by mouth every morning.     . Cholecalciferol (VITAMIN D PO) Take 5,000 Units by mouth every morning.     . furosemide (LASIX) 40 MG tablet Take 20 mg by mouth daily.     [name] levothyroxine (SYNTHROID, LEVOTHROID) 100 MCG tablet Take 100 mcg by mouth daily before breakfast.    . losartan (COZAAR) 100 MG tablet TAKE 1 TABLET (100 MG TOTAL) BY MOUTH DAILY. 90 tablet 3  . methocarbamol (ROBAXIN) 500 MG tablet Take 1 tablet (500 mg total) by mouth every 6 (six) hours as needed for muscle spasms. 50 tablet 0  . nitroGLYCERIN (NITROSTAT) 0.4 MG SL tablet Place 1 tablet (0.4 mg total) under the tongue every 5 (five) minutes as needed for chest pain (MAX 3 TABLETS). 25 tablet prn  . potassium chloride SA (K-DUR,KLOR-CON) 20 MEQ tablet Take 20 mEq by mouth 2 (two) times daily.    . pregabalin (LYRICA) 75 MG capsule Take 1 capsule (75 mg total) by mouth 2 (two) times daily. 60 capsule 5  . ranitidine (ZANTAC) 300 MG tablet Take 300 mg by mouth daily. Take 1-2 tablets by mouth daily as needed for heartburn or acid reflux    . sertraline  (ZOLOFT) 50 MG tablet Take 1 tablet (50 mg total) by mouth daily. For mood 90 tablet 1  . traMADol (ULTRAM) 50 MG tablet Take 50 mg by mouth every 6 (six) hours as needed (pain).    . triamcinolone cream (KENALOG) 0.1 % APPLY TO AFFECTED AREA TWICE A DAY 45 g 2  . warfarin (COUMADIN) 2 MG tablet Take as directed 100 tablet 1   No current facility-administered medications for this visit.    Allergies:   Amiodarone; Codeine; Gabapentin; Hydrocodone; Morphine and related; Requip; and  Zinc    Social History:  The patient  reports that she has never smoked. She has never used smokeless tobacco. She reports that she does not drink alcohol or use illicit drugs.   Family History:  The patient's family history includes Colon cancer (age of onset: 20) in her mother; Heart attack in her father; Heart disease in her father, maternal grandmother, and mother.    ROS:  Please see the history of present illness.   Otherwise, review of systems are positive for none.   All other systems are reviewed and negative.    PHYSICAL EXAM: VS:  BP 108/60 mmHg  Pulse 88  Ht 5\' 1"  (1.549 m)  Wt 149 lb (67.586 kg)  BMI 28.17 kg/m2  LMP  (Exact Date) , BMI Body mass index is 28.17 kg/(m^2). GEN: Well nourished, well developed, in no acute distress HEENT: normal Neck: no JVD, carotid bruits, or masses Cardiac:  Irregularly irregular rhythm. There is a grade 2/6 systolic ejection murmur at the aortic area. No diastolic murmur heard. There is no rubs, or gallops,no edema  Respiratory:  clear to auscultation bilaterally, normal work of breathing GI: soft, nontender, nondistended, + BS MS: no deformity or atrophy Skin: warm and dry, no rash Neuro:  Strength and sensation are intact Psych: euthymic mood, full affect   EKG:  EKG is ordered today. The ekg ordered today demonstrates  Atrial fibrillation with ventricular response   90 bpm. Acute ischemic changes. No paced beats seen   Recent Labs: 07/18/2015: ALT  10; BUN 23; Creat 1.14*; Hemoglobin 12.9; Magnesium 1.6; Platelets 204; Potassium 4.1; Sodium 142; TSH 1.783    Lipid Panel    Component Value Date/Time   CHOL 194 07/18/2015 1152   TRIG 251* 07/18/2015 1152   HDL 29* 07/18/2015 1152   CHOLHDL 6.7* 07/18/2015 1152   VLDL 50* 07/18/2015 1152   LDLCALC 115 07/18/2015 1152      Wt Readings from Last 3 Encounters:  10/09/15 149 lb (67.586 kg)  10/09/15 150 lb (68.04 kg)  07/18/15 151 lb (68.493 kg)        ASSESSMENT AND PLAN:  1. Paroxysmal atrial fibrillation. Frequency of paroxysmal atrial fibrillation and has increased. She is now essentially staying in atrial fibrillation with rapid ventricular response and is symptomatic. 2. tachybradycardia syndrome with functioning dual-chamber pacemaker. 3. essential hypertension without heart failure. 4. hypercholesterolemia followed by PCP 5. osteoarthritis of right knee followed by Dr. Alvan Dame 6. mild to moderate aortic stenosis 7. history of left nephrectomy secondary to kidney cancer 8. history of endometrial cancer 9. GERD 10. History of cholesterol embolus to her left eye with central retinal artery occlusion and loss of vision     Current medicines are reviewed at length with the patient today.  The patient does not have concerns regarding medicines.  The following changes have been made:   Began digoxin  0.0625 milligrams  Daily. We will start intentionally with a low dose because of her mild renal insufficiency. The dose may be able to be increased with time and checking digoxin levels in the future.  Labs/ tests ordered today include:   Orders Placed This Encounter  Procedures  . EKG 12-Lead  . ECHOCARDIOGRAM COMPLETE     disposition: we will update her echocardiogram to look at her current left ventricular systolic function. Also to evaluate her aortic stenosis.  Start digoxin as noted above.  Refer to atrial fibrillation clinic for further thoughts on  management.  Following my retirement  she will see Dr. [name] in 3 months.   Berna Spare MD 10/09/2015 2:21 PM    [facility], [place], [place]  [address] Phone: [phone]; Fax: [phone]

## 2015-10-10 LAB — HEMOGLOBIN A1C
HEMOGLOBIN A1C: 5.7 % — AB (ref <5.7)
Mean Plasma Glucose: 117 mg/dL — ABNORMAL HIGH (ref <117)

## 2015-10-10 LAB — MICROALBUMIN / CREATININE URINE RATIO
Creatinine, Urine: 17 mg/dL — ABNORMAL LOW (ref 20–320)
Microalb, Ur: <0.2 mg/dL

## 2015-10-10 LAB — INSULIN, RANDOM: INSULIN: 33.8 u[IU]/mL — AB (ref 2.0–19.6)

## 2015-10-10 LAB — VITAMIN D 25 HYDROXY (VIT D DEFICIENCY, FRACTURES): VIT D 25 HYDROXY: 61 ng/mL (ref 30–100)

## 2015-10-14 ENCOUNTER — Ambulatory Visit (HOSPITAL_COMMUNITY)
Admission: RE | Admit: 2015-10-14 | Discharge: 2015-10-14 | Disposition: A | Discharge status: Home / Self Care | Payer: Medicare Other | Source: Ambulatory Visit | Attending: Nurse Practitioner | Admitting: Nurse Practitioner

## 2015-10-14 VITALS — BP 138/82 | HR 94 | Ht 61.0 in | Wt 150.4 lb

## 2015-10-14 DIAGNOSIS — Z888 Allergy status to other drugs, medicaments and biological substances status: Secondary | ICD-10-CM | POA: Diagnosis not present

## 2015-10-14 DIAGNOSIS — K3184 Gastroparesis: Secondary | ICD-10-CM | POA: Diagnosis not present

## 2015-10-14 DIAGNOSIS — I495 Sick sinus syndrome: Secondary | ICD-10-CM | POA: Diagnosis not present

## 2015-10-14 DIAGNOSIS — I481 Persistent atrial fibrillation: Secondary | ICD-10-CM | POA: Insufficient documentation

## 2015-10-14 DIAGNOSIS — I129 Hypertensive chronic kidney disease with stage 1 through stage 4 chronic kidney disease, or unspecified chronic kidney disease: Secondary | ICD-10-CM | POA: Diagnosis not present

## 2015-10-14 DIAGNOSIS — Z85528 Personal history of other malignant neoplasm of kidney: Secondary | ICD-10-CM | POA: Insufficient documentation

## 2015-10-14 DIAGNOSIS — Z8542 Personal history of malignant neoplasm of other parts of uterus: Secondary | ICD-10-CM | POA: Diagnosis not present

## 2015-10-14 DIAGNOSIS — Z950 Presence of cardiac pacemaker: Secondary | ICD-10-CM | POA: Diagnosis not present

## 2015-10-14 DIAGNOSIS — Z7901 Long term (current) use of anticoagulants: Secondary | ICD-10-CM | POA: Diagnosis not present

## 2015-10-14 DIAGNOSIS — K219 Gastro-esophageal reflux disease without esophagitis: Secondary | ICD-10-CM | POA: Insufficient documentation

## 2015-10-14 DIAGNOSIS — I4819 Other persistent atrial fibrillation: Secondary | ICD-10-CM

## 2015-10-14 DIAGNOSIS — G2581 Restless legs syndrome: Secondary | ICD-10-CM | POA: Diagnosis not present

## 2015-10-14 DIAGNOSIS — Z8249 Family history of ischemic heart disease and other diseases of the circulatory system: Secondary | ICD-10-CM | POA: Insufficient documentation

## 2015-10-14 DIAGNOSIS — Z885 Allergy status to narcotic agent status: Secondary | ICD-10-CM | POA: Insufficient documentation

## 2015-10-14 DIAGNOSIS — E785 Hyperlipidemia, unspecified: Secondary | ICD-10-CM | POA: Diagnosis not present

## 2015-10-14 DIAGNOSIS — Z79899 Other long term (current) drug therapy: Secondary | ICD-10-CM | POA: Insufficient documentation

## 2015-10-14 DIAGNOSIS — Z905 Acquired absence of kidney: Secondary | ICD-10-CM | POA: Insufficient documentation

## 2015-10-14 DIAGNOSIS — N189 Chronic kidney disease, unspecified: Secondary | ICD-10-CM | POA: Diagnosis not present

## 2015-10-14 DIAGNOSIS — E039 Hypothyroidism, unspecified: Secondary | ICD-10-CM | POA: Insufficient documentation

## 2015-10-14 DIAGNOSIS — I2510 Atherosclerotic heart disease of native coronary artery without angina pectoris: Secondary | ICD-10-CM | POA: Insufficient documentation

## 2015-10-15 ENCOUNTER — Encounter (HOSPITAL_COMMUNITY): Payer: Self-pay | Admitting: Nurse Practitioner

## 2015-10-15 ENCOUNTER — Other Ambulatory Visit (HOSPITAL_COMMUNITY): Payer: Self-pay | Admitting: *Deleted

## 2015-10-15 NOTE — Progress Notes (Signed)
Patient ID: [name], female   DOB: 1933-10-18, 82 y.o.   MRN: [id number]     Primary Care Physician: [name], MD Referring Physician: Dr. Annamary Rummage is a 82 y.o. female with a h/o tachybradycardia syndrome . She has a permanent pacemaker implanted 07/01/10 By Dr. Rayann Heman . She has a remote history of repair of a patent ductus arteriosus. She has a history of moderate valvular aortic stenosis. She has had a history of congestive heart failure. Most recent admission was 08/25/12-08/29/12 for exacerbation of CHF. She was hospitalized again in November 2015 for congestive heart failure and pneumonia. Her last echocardiogram 08/26/12 showed ejection fraction of 60-65% with grade 2 diastolic dysfunction and a peak aortic valve gradient of 29 with a mean aortic valve gradient of 14. Her more recent echocardiogram in September 2015 showed ejection fraction of 55-60%. The aortic valve showed mild stenosis and moderate regurgitation.  She has had a total of 3 operations on her right leg. Dr. Alvan Dame is her orthopedist. 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 She had carotid Dopplers which did not show any significant obstruction. since last visit she states that she has essentially been staying in atrial fibrillation instead of going in and out of atrial fibrillation. At her last visit in an effort to keep her in sinus rhythm we had increased her atenolol. She is in atrial fibrillation she has a lot of exertional dyspnea and her blood pressure is lower then otherwise. She has not been experiencing any chest pain.She has a past history of intolerance to amiodarone because of thyroid hepatic and renal issues. Last visit Dr. Mare Ferrari added digoxin for better controlled afib.  She is in the afib clinic today to discuss options for better management of afib. He does have CKD with creat cl  calculated at 39.5%. She does have fatigue and exertional dyspnea with afib. Since the addition of digoxin, she has not shown any improvement.  Today, she denies symptoms of palpitations, chest pain, shortness of breath, orthopnea, PND, lower extremity edema, dizziness, presyncope, syncope, or neurologic sequela. The patient is tolerating medications without difficulties and is otherwise without complaint today.   Past Medical History  Diagnosis Date  . PAF (paroxysmal atrial fibrillation) ([place])     controlled with amiodarone, on coumadin  . CAD (coronary artery disease)   . Aortic root dilatation ([place])   . Moderate aortic stenosis   . Chronic anticoagulation   . Tachycardia-bradycardia syndrome ([place])     s/p PPM by Dr Doreatha Lew (MDT) 07/03/10  . GERD (gastroesophageal reflux disease)   . Pacemaker     2012  . Neuropathy ([place])     feet/legs  . Restless leg syndrome   . Headache(784.0)     hx migraines - takes Zoloft for migraines  . History of uterine cancer 1980s    treated with hysterectomy, external radiation and radiation seed implants.   . Cancer ([place])     hx Kidney cancer / hx of endometrial cancer  . PONV (postoperative nausea and vomiting)   . Anxiety   . Heart murmur   . HTN (hypertension)   . Hyperlipemia   . Hypothyroidism   . Osteomyelitis ([place]) as child  . Family history of anesthesia complication     daughter has ponv  . Syncope 05/28/2014  . Hepatic steatosis   . Elevated LFTs   . Gastroparesis   .  Anal fissure   . Radiation proctitis   . Gastric polyp     adenomatous  . Neuromuscular disorder (HCC)     neuropathy in right leg  . Chronic kidney disease     only has one kidney   . 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 Cardiac catheterization  2008  . Nephrectomy Left     renal cell cancer  . Total abdominal hysterectomy    . Patent ductus arterious repair  age 64  . Ptvp    . Pacemaker insertion  07/04/11     by Dr Doreatha Lew  . Cholecystectomy  1970s  . Upper gastrointestinal endoscopy    . Total knee arthroplasty  02/07/2012    Procedure: TOTAL KNEE ARTHROPLASTY;  Surgeon: Mauri Pole, MD;  [facility];  Service: Orthopedics;  Laterality: Right;  . Insert / replace / remove pacemaker    . Total hip revision Right 2009  . I&d knee with poly exchange Right 12/17/2013    Procedure: IRRIGATION AND DEBRIDEMEN RIGHT TOTAL  KNEE WITH POLY EXCHANGE;  Surgeon: Mauri Pole, MD;  [facility];  Service: Orthopedics;  Laterality: Right;  . Patellar tendon repair Right 03/06/2014    Procedure: AVULSION WITH PATELLA TENDON REPAIR;  Surgeon: Mauri Pole, MD;  [facility];  Service: Orthopedics;  Laterality: Right;  . Total knee revision Right 07/29/2014    Procedure: RIGHT KNEE REVISION OF PREVIOUS REPAIR EXTENSOR MECHANISM;  Surgeon: Mauri Pole, MD;  [facility];  Service: Orthopedics;  Laterality: Right;    Current Outpatient Prescriptions  Medication Sig Dispense Refill  . ALPRAZolam (XANAX) 0.5 MG tablet TAKE 1/2 TO 1 TABLET BY MOUTH 3 TIMES A DAY AS NEEDED FOR ANXIETY 90 tablet 5  . atenolol (TENORMIN) 100 MG tablet Take 1 tablet (100 mg total) by mouth 2 (two) times daily. 60 tablet 11  . Biotin 5000 MCG CAPS Take 10,000 mcg by mouth every morning.     . Cholecalciferol (VITAMIN D PO) Take 5,000 Units by mouth every morning.     . digoxin (LANOXIN) 0.125 MG tablet 1/2 TABLET BY MOUTH DAILY 45 tablet 3  . furosemide (LASIX) 40 MG tablet Take 20 mg by mouth daily.     [name] levothyroxine (SYNTHROID, LEVOTHROID) 100 MCG tablet Take 100 mcg by mouth daily before breakfast.    . losartan (COZAAR) 100 MG tablet TAKE 1 TABLET (100 MG TOTAL) BY MOUTH DAILY. 90 tablet 3  . methocarbamol (ROBAXIN) 500 MG tablet Take 1 tablet (500 mg total) by mouth every 6 (six) hours as needed for muscle spasms. 50 tablet 0  . nitroGLYCERIN (NITROSTAT) 0.4 MG SL tablet Place 1 tablet (0.4 mg total) under the  tongue every 5 (five) minutes as needed for chest pain (MAX 3 TABLETS). 25 tablet prn  . potassium chloride SA (K-DUR,KLOR-CON) 20 MEQ tablet Take 20 mEq by mouth 2 (two) times daily.    . pregabalin (LYRICA) 75 MG capsule Take 1 capsule (75 mg total) by mouth 2 (two) times daily. 60 capsule 5  . ranitidine (ZANTAC) 300 MG tablet Take 300 mg by mouth daily. Take 1-2 tablets by mouth daily as needed for heartburn or acid reflux    . sertraline (ZOLOFT) 50 MG tablet Take 1 tablet (50 mg total) by mouth daily. For mood 90 tablet 1  . traMADol (ULTRAM) 50 MG tablet Take 50 mg by mouth every 6 (six) hours  as needed (pain). Reported on 10/14/2015    . triamcinolone cream (KENALOG) 0.1 % APPLY TO AFFECTED AREA TWICE A DAY 45 g 2  . warfarin (COUMADIN) 2 MG tablet Take as directed 100 tablet 1   No current facility-administered medications for this encounter.    Allergies  Allergen Reactions  . Amiodarone      Thyroid and liver and kidney problems  . Codeine Nausea And Vomiting  . Gabapentin Itching  . Hydrocodone Other (See Comments)    hallucination  . Morphine And Related Nausea And Vomiting  . Requip [Ropinirole Hcl]     Headache   . Zinc     nausea    Social History   Social History  . Marital Status: Married    Spouse Name: N/A  . Number of Children: N/A  . Years of Education: N/A   Occupational History  . Not on file.   Social History Main Topics  . Smoking status: Never Smoker   . Smokeless tobacco: Never Used  . Alcohol Use: No  . Drug Use: No  . Sexual Activity: No   Other Topics Concern  . Not on file   Social History Narrative   Lives in [place].  Continues to work.    Family History  Problem Relation Age of Onset  . Colon cancer Mother 41  . Heart disease Mother   . Heart disease Father   . Heart attack Father   . Heart disease Maternal Grandmother     ROS- All systems are reviewed and negative except as per the HPI above  Physical  Exam: Filed Vitals:   10/14/15 1000  BP: 138/82  Pulse: 94  Height: 5\' 1"  (1.549 m)  Weight: 150 lb 6.4 oz (68.221 kg)    GEN- The patient is well appearing, alert and oriented x 3 today.   Head- normocephalic, atraumatic Eyes-  Sclera clear, conjunctiva pink Ears- hearing intact Oropharynx- clear Neck- supple, no JVP Lymph- no cervical lymphadenopathy Lungs- Clear to ausculation bilaterally, normal work of breathing Heart- Regular rate and rhythm, no murmurs, rubs or gallops, PMI not laterally displaced GI- soft, NT, ND, + BS Extremities- no clubbing, cyanosis, or edema MS- no significant deformity or atrophy Skin- no rash or lesion Psych- euthymic mood, full affect Neuro- strength and sensation are intact  EKG- afib at 94 bpm, st/t wave abnormality, qrs int 70 ms, qtc, 452 ms Epic records reviewed    Assessment and Plan: 1.Persistent afib Discussed with Dr. Rayann Heman, she has failed amiodarone in the past, renal function may significantly limit the amount of tikosyn that can be used. Dr. Rayann Heman suggested flecainide 50 mg bid and if does not chemically convert, then schedule DCCV. She will need a myoview after flecainide initiated. Echo pending 2/15 and would like to initiate after results of echo are known. Stop digoxin Continue atenolol Continue warfarin( last low 1.7 12/30)  Seen in conjunction with Dr. Lawrence Marseilles C. Natayla Cadenhead, [facility] [address] [place], [place] [address] [phone]

## 2015-10-22 ENCOUNTER — Ambulatory Visit (HOSPITAL_COMMUNITY): Payer: Medicare Other | Attending: Cardiovascular Disease

## 2015-10-22 ENCOUNTER — Other Ambulatory Visit: Payer: Self-pay

## 2015-10-22 DIAGNOSIS — I350 Nonrheumatic aortic (valve) stenosis: Secondary | ICD-10-CM

## 2015-10-22 DIAGNOSIS — I480 Paroxysmal atrial fibrillation: Secondary | ICD-10-CM | POA: Diagnosis not present

## 2015-10-22 DIAGNOSIS — I071 Rheumatic tricuspid insufficiency: Secondary | ICD-10-CM | POA: Diagnosis not present

## 2015-10-22 DIAGNOSIS — I517 Cardiomegaly: Secondary | ICD-10-CM | POA: Diagnosis not present

## 2015-10-22 DIAGNOSIS — I352 Nonrheumatic aortic (valve) stenosis with insufficiency: Secondary | ICD-10-CM | POA: Insufficient documentation

## 2015-10-22 DIAGNOSIS — I4891 Unspecified atrial fibrillation: Secondary | ICD-10-CM | POA: Diagnosis present

## 2015-10-25 ENCOUNTER — Other Ambulatory Visit: Payer: Self-pay | Admitting: Internal Medicine

## 2015-10-29 ENCOUNTER — Other Ambulatory Visit (HOSPITAL_COMMUNITY): Payer: Self-pay | Admitting: *Deleted

## 2015-10-29 ENCOUNTER — Telehealth (HOSPITAL_COMMUNITY): Payer: Self-pay | Admitting: Nurse Practitioner

## 2015-10-29 DIAGNOSIS — I4819 Other persistent atrial fibrillation: Secondary | ICD-10-CM

## 2015-10-29 DIAGNOSIS — R0602 Shortness of breath: Secondary | ICD-10-CM

## 2015-10-29 NOTE — Telephone Encounter (Signed)
Had discussion with daughter, [name], that I reviewed recent echo and structurally would be ok to start flecainide as discussed with Dr. Rayann Heman. But, I found her old cath report from 2008 and she  had multivessel disease then with small vessel disease with 80-90% proximal first septal perforator and 60% ostial diagonal,non obstructive disease of LAD with 30-40% stenosis, D1 with 60% ostial stenosis. Aggressive medical management recommended. The daughter stated that this shortness of breath has worsened over the last few weeks. This could be attributed to afib but since she had this much disease in 2008, I feel it would be wise to repeat stress test to see if coronary disease has worsened and could be contributing to dyspnea as well. Regardless, pt is not a candidate for flecainide due to results found on this cath, did not tolerate amiodarone in the past and renal function may not allow much of tikosyn to be used and therefore may be ineffective.Will schedule for a lexi myoview before deciding on afib management but not good options.

## 2015-11-06 ENCOUNTER — Telehealth: Payer: Self-pay | Admitting: Cardiology

## 2015-11-06 ENCOUNTER — Ambulatory Visit (INDEPENDENT_AMBULATORY_CARE_PROVIDER_SITE_OTHER): Payer: Medicare Other | Admitting: *Deleted

## 2015-11-06 DIAGNOSIS — I495 Sick sinus syndrome: Secondary | ICD-10-CM

## 2015-11-06 NOTE — Telephone Encounter (Signed)
LMOVM reminding pt to send remote transmission.   

## 2015-11-07 ENCOUNTER — Inpatient Hospital Stay (HOSPITAL_COMMUNITY): Payer: Medicare Other

## 2015-11-07 ENCOUNTER — Inpatient Hospital Stay (HOSPITAL_COMMUNITY)
Admission: EM | Admit: 2015-11-07 | Discharge: 2015-11-15 | DRG: 871 | Disposition: A | Discharge status: Home Health | Payer: Medicare Other | Attending: Internal Medicine | Admitting: Internal Medicine

## 2015-11-07 ENCOUNTER — Emergency Department (HOSPITAL_COMMUNITY): Payer: Medicare Other

## 2015-11-07 ENCOUNTER — Encounter (HOSPITAL_COMMUNITY): Payer: Self-pay | Admitting: Emergency Medicine

## 2015-11-07 DIAGNOSIS — I480 Paroxysmal atrial fibrillation: Secondary | ICD-10-CM | POA: Diagnosis present

## 2015-11-07 DIAGNOSIS — Z96641 Presence of right artificial hip joint: Secondary | ICD-10-CM | POA: Diagnosis present

## 2015-11-07 DIAGNOSIS — R0602 Shortness of breath: Secondary | ICD-10-CM | POA: Diagnosis not present

## 2015-11-07 DIAGNOSIS — R0600 Dyspnea, unspecified: Secondary | ICD-10-CM

## 2015-11-07 DIAGNOSIS — E46 Unspecified protein-calorie malnutrition: Secondary | ICD-10-CM | POA: Diagnosis not present

## 2015-11-07 DIAGNOSIS — D649 Anemia, unspecified: Secondary | ICD-10-CM | POA: Diagnosis not present

## 2015-11-07 DIAGNOSIS — Z950 Presence of cardiac pacemaker: Secondary | ICD-10-CM

## 2015-11-07 DIAGNOSIS — Z7901 Long term (current) use of anticoagulants: Secondary | ICD-10-CM

## 2015-11-07 DIAGNOSIS — K319 Disease of stomach and duodenum, unspecified: Secondary | ICD-10-CM | POA: Diagnosis not present

## 2015-11-07 DIAGNOSIS — G629 Polyneuropathy, unspecified: Secondary | ICD-10-CM | POA: Diagnosis present

## 2015-11-07 DIAGNOSIS — J189 Pneumonia, unspecified organism: Secondary | ICD-10-CM | POA: Diagnosis not present

## 2015-11-07 DIAGNOSIS — K3189 Other diseases of stomach and duodenum: Secondary | ICD-10-CM | POA: Diagnosis not present

## 2015-11-07 DIAGNOSIS — Z923 Personal history of irradiation: Secondary | ICD-10-CM

## 2015-11-07 DIAGNOSIS — I509 Heart failure, unspecified: Secondary | ICD-10-CM | POA: Diagnosis present

## 2015-11-07 DIAGNOSIS — Z90710 Acquired absence of both cervix and uterus: Secondary | ICD-10-CM | POA: Diagnosis not present

## 2015-11-07 DIAGNOSIS — R0902 Hypoxemia: Secondary | ICD-10-CM

## 2015-11-07 DIAGNOSIS — I739 Peripheral vascular disease, unspecified: Secondary | ICD-10-CM | POA: Diagnosis not present

## 2015-11-07 DIAGNOSIS — D6489 Other specified anemias: Secondary | ICD-10-CM | POA: Diagnosis not present

## 2015-11-07 DIAGNOSIS — J40 Bronchitis, not specified as acute or chronic: Secondary | ICD-10-CM | POA: Diagnosis not present

## 2015-11-07 DIAGNOSIS — K3184 Gastroparesis: Secondary | ICD-10-CM | POA: Diagnosis present

## 2015-11-07 DIAGNOSIS — I10 Essential (primary) hypertension: Secondary | ICD-10-CM | POA: Diagnosis present

## 2015-11-07 DIAGNOSIS — R509 Fever, unspecified: Secondary | ICD-10-CM | POA: Diagnosis not present

## 2015-11-07 DIAGNOSIS — D509 Iron deficiency anemia, unspecified: Secondary | ICD-10-CM | POA: Diagnosis not present

## 2015-11-07 DIAGNOSIS — R1013 Epigastric pain: Secondary | ICD-10-CM | POA: Diagnosis not present

## 2015-11-07 DIAGNOSIS — I350 Nonrheumatic aortic (valve) stenosis: Secondary | ICD-10-CM | POA: Diagnosis present

## 2015-11-07 DIAGNOSIS — N390 Urinary tract infection, site not specified: Secondary | ICD-10-CM | POA: Diagnosis not present

## 2015-11-07 DIAGNOSIS — E538 Deficiency of other specified B group vitamins: Secondary | ICD-10-CM

## 2015-11-07 DIAGNOSIS — B379 Candidiasis, unspecified: Secondary | ICD-10-CM | POA: Diagnosis not present

## 2015-11-07 DIAGNOSIS — D490 Neoplasm of unspecified behavior of digestive system: Secondary | ICD-10-CM | POA: Diagnosis not present

## 2015-11-07 DIAGNOSIS — C168 Malignant neoplasm of overlapping sites of stomach: Secondary | ICD-10-CM | POA: Diagnosis present

## 2015-11-07 DIAGNOSIS — R7881 Bacteremia: Secondary | ICD-10-CM | POA: Diagnosis present

## 2015-11-07 DIAGNOSIS — Z8542 Personal history of malignant neoplasm of other parts of uterus: Secondary | ICD-10-CM

## 2015-11-07 DIAGNOSIS — Z6827 Body mass index (BMI) 27.0-27.9, adult: Secondary | ICD-10-CM | POA: Diagnosis not present

## 2015-11-07 DIAGNOSIS — C169 Malignant neoplasm of stomach, unspecified: Secondary | ICD-10-CM | POA: Diagnosis not present

## 2015-11-07 DIAGNOSIS — Z96651 Presence of right artificial knee joint: Secondary | ICD-10-CM | POA: Diagnosis not present

## 2015-11-07 DIAGNOSIS — R195 Other fecal abnormalities: Secondary | ICD-10-CM | POA: Diagnosis present

## 2015-11-07 DIAGNOSIS — I130 Hypertensive heart and chronic kidney disease with heart failure and stage 1 through stage 4 chronic kidney disease, or unspecified chronic kidney disease: Secondary | ICD-10-CM | POA: Diagnosis not present

## 2015-11-07 DIAGNOSIS — Z85528 Personal history of other malignant neoplasm of kidney: Secondary | ICD-10-CM | POA: Diagnosis not present

## 2015-11-07 DIAGNOSIS — I129 Hypertensive chronic kidney disease with stage 1 through stage 4 chronic kidney disease, or unspecified chronic kidney disease: Secondary | ICD-10-CM | POA: Diagnosis present

## 2015-11-07 DIAGNOSIS — K219 Gastro-esophageal reflux disease without esophagitis: Secondary | ICD-10-CM | POA: Diagnosis not present

## 2015-11-07 DIAGNOSIS — A419 Sepsis, unspecified organism: Secondary | ICD-10-CM | POA: Diagnosis not present

## 2015-11-07 DIAGNOSIS — H5442 Blindness, left eye, normal vision right eye: Secondary | ICD-10-CM | POA: Diagnosis not present

## 2015-11-07 DIAGNOSIS — J90 Pleural effusion, not elsewhere classified: Secondary | ICD-10-CM

## 2015-11-07 DIAGNOSIS — N179 Acute kidney failure, unspecified: Secondary | ICD-10-CM | POA: Diagnosis not present

## 2015-11-07 DIAGNOSIS — I2510 Atherosclerotic heart disease of native coronary artery without angina pectoris: Secondary | ICD-10-CM | POA: Diagnosis present

## 2015-11-07 DIAGNOSIS — Z905 Acquired absence of kidney: Secondary | ICD-10-CM

## 2015-11-07 DIAGNOSIS — D519 Vitamin B12 deficiency anemia, unspecified: Secondary | ICD-10-CM | POA: Diagnosis present

## 2015-11-07 DIAGNOSIS — J9811 Atelectasis: Secondary | ICD-10-CM | POA: Diagnosis not present

## 2015-11-07 DIAGNOSIS — Z531 Procedure and treatment not carried out because of patient's decision for reasons of belief and group pressure: Secondary | ICD-10-CM

## 2015-11-07 DIAGNOSIS — IMO0001 Reserved for inherently not codable concepts without codable children: Secondary | ICD-10-CM

## 2015-11-07 DIAGNOSIS — I69398 Other sequelae of cerebral infarction: Secondary | ICD-10-CM | POA: Diagnosis not present

## 2015-11-07 DIAGNOSIS — E039 Hypothyroidism, unspecified: Secondary | ICD-10-CM | POA: Diagnosis not present

## 2015-11-07 DIAGNOSIS — N183 Chronic kidney disease, stage 3 unspecified: Secondary | ICD-10-CM | POA: Diagnosis present

## 2015-11-07 DIAGNOSIS — Z96659 Presence of unspecified artificial knee joint: Secondary | ICD-10-CM

## 2015-11-07 DIAGNOSIS — R1030 Lower abdominal pain, unspecified: Secondary | ICD-10-CM | POA: Diagnosis not present

## 2015-11-07 DIAGNOSIS — Z79899 Other long term (current) drug therapy: Secondary | ICD-10-CM | POA: Diagnosis not present

## 2015-11-07 DIAGNOSIS — M25561 Pain in right knee: Secondary | ICD-10-CM | POA: Diagnosis not present

## 2015-11-07 DIAGNOSIS — A408 Other streptococcal sepsis: Secondary | ICD-10-CM | POA: Diagnosis not present

## 2015-11-07 DIAGNOSIS — M7989 Other specified soft tissue disorders: Secondary | ICD-10-CM | POA: Diagnosis not present

## 2015-11-07 DIAGNOSIS — R52 Pain, unspecified: Secondary | ICD-10-CM

## 2015-11-07 DIAGNOSIS — I4891 Unspecified atrial fibrillation: Secondary | ICD-10-CM | POA: Diagnosis present

## 2015-11-07 DIAGNOSIS — R05 Cough: Secondary | ICD-10-CM | POA: Diagnosis not present

## 2015-11-07 HISTORY — DX: Malignant neoplasm of stomach, unspecified: C16.9

## 2015-11-07 LAB — CBC WITH DIFFERENTIAL/PLATELET
BASOS PCT: 0 %
Basophils Absolute: 0 10*3/uL (ref 0.0–0.1)
EOS ABS: 0 10*3/uL (ref 0.0–0.7)
Eosinophils Relative: 0 %
HCT: 32.7 % — ABNORMAL LOW (ref 36.0–46.0)
Hemoglobin: 10.2 g/dL — ABNORMAL LOW (ref 12.0–15.0)
Lymphocytes Relative: 4 %
Lymphs Abs: 0.7 10*3/uL (ref 0.7–4.0)
MCH: 27.1 pg (ref 26.0–34.0)
MCHC: 31.2 g/dL (ref 30.0–36.0)
MCV: 86.7 fL (ref 78.0–100.0)
MONO ABS: 0.7 10*3/uL (ref 0.1–1.0)
MONOS PCT: 5 %
Neutro Abs: 13.7 10*3/uL — ABNORMAL HIGH (ref 1.7–7.7)
Neutrophils Relative %: 91 %
Platelets: 238 10*3/uL (ref 150–400)
RBC: 3.77 MIL/uL — ABNORMAL LOW (ref 3.87–5.11)
RDW: 14.6 % (ref 11.5–15.5)
WBC: 15 10*3/uL — ABNORMAL HIGH (ref 4.0–10.5)

## 2015-11-07 LAB — EXPECTORATED SPUTUM ASSESSMENT W GRAM STAIN, RFLX TO RESP C

## 2015-11-07 LAB — URINE MICROSCOPIC-ADD ON

## 2015-11-07 LAB — URINALYSIS, ROUTINE W REFLEX MICROSCOPIC
BILIRUBIN URINE: NEGATIVE
GLUCOSE, UA: NEGATIVE mg/dL
KETONES UR: NEGATIVE mg/dL
Nitrite: NEGATIVE
PH: 5.5 (ref 5.0–8.0)
Protein, ur: NEGATIVE mg/dL
Specific Gravity, Urine: 1.026 (ref 1.005–1.030)

## 2015-11-07 LAB — COMPREHENSIVE METABOLIC PANEL
ALBUMIN: 3.3 g/dL — AB (ref 3.5–5.0)
ALK PHOS: 107 U/L (ref 38–126)
ALT: 12 U/L — ABNORMAL LOW (ref 14–54)
AST: 18 U/L (ref 15–41)
Anion gap: 7 (ref 5–15)
BILIRUBIN TOTAL: 0.4 mg/dL (ref 0.3–1.2)
BUN: 19 mg/dL (ref 6–20)
CALCIUM: 8.1 mg/dL — AB (ref 8.9–10.3)
CO2: 18 mmol/L — AB (ref 22–32)
Chloride: 114 mmol/L — ABNORMAL HIGH (ref 101–111)
Creatinine, Ser: 1.09 mg/dL — ABNORMAL HIGH (ref 0.44–1.00)
GFR calc Af Amer: 54 mL/min — ABNORMAL LOW (ref >60)
GFR calc non Af Amer: 47 mL/min — ABNORMAL LOW (ref >60)
GLUCOSE: 140 mg/dL — AB (ref 65–99)
POTASSIUM: 3.8 mmol/L (ref 3.5–5.1)
SODIUM: 139 mmol/L (ref 135–145)
TOTAL PROTEIN: 6.3 g/dL — AB (ref 6.5–8.1)

## 2015-11-07 LAB — EXPECTORATED SPUTUM ASSESSMENT W REFEX TO RESP CULTURE

## 2015-11-07 LAB — I-STAT CG4 LACTIC ACID, ED
LACTIC ACID, VENOUS: 2.13 mmol/L — AB (ref 0.5–2.0)
LACTIC ACID, VENOUS: 2.26 mmol/L — AB (ref 0.5–2.0)

## 2015-11-07 LAB — PROTIME-INR
INR: 2.31 — AB (ref 0.00–1.49)
PROTHROMBIN TIME: 25.1 s — AB (ref 11.6–15.2)

## 2015-11-07 LAB — INFLUENZA PANEL BY PCR (TYPE A & B)
H1N1 flu by pcr: NOT DETECTED
Influenza A By PCR: NEGATIVE
Influenza B By PCR: NEGATIVE

## 2015-11-07 LAB — PROCALCITONIN: PROCALCITONIN: 2.47 ng/mL

## 2015-11-07 LAB — MAGNESIUM: Magnesium: 1.5 mg/dL — ABNORMAL LOW (ref 1.7–2.4)

## 2015-11-07 LAB — LACTIC ACID, PLASMA: Lactic Acid, Venous: 1.8 mmol/L (ref 0.5–2.0)

## 2015-11-07 LAB — APTT: aPTT: 44 seconds — ABNORMAL HIGH (ref 24–37)

## 2015-11-07 LAB — TSH: TSH: 0.851 u[IU]/mL (ref 0.350–4.500)

## 2015-11-07 MED ORDER — WARFARIN - PHYSICIAN DOSING INPATIENT
Freq: Every day | Status: DC
Start: 2015-11-08 — End: 2015-11-09

## 2015-11-07 MED ORDER — ACETAMINOPHEN 500 MG PO TABS
1000.0000 mg | ORAL_TABLET | Freq: Once | ORAL | Status: AC
  Administered 2015-11-07: 1000 mg via ORAL
  Filled 2015-11-07: qty 2

## 2015-11-07 MED ORDER — ALBUTEROL SULFATE (2.5 MG/3ML) 0.083% IN NEBU
2.5000 mg | INHALATION_SOLUTION | RESPIRATORY_TRACT | Status: DC | PRN
Start: 2015-11-07 — End: 2015-11-08

## 2015-11-07 MED ORDER — ONDANSETRON HCL 4 MG/2ML IJ SOLN
4.0000 mg | Freq: Four times a day (QID) | INTRAMUSCULAR | Status: DC | PRN
Start: 2015-11-07 — End: 2015-11-15

## 2015-11-07 MED ORDER — SODIUM CHLORIDE 0.9 % IV SOLN
INTRAVENOUS | Status: AC
  Administered 2015-11-07 – 2015-11-08 (×3): via INTRAVENOUS

## 2015-11-07 MED ORDER — GUAIFENESIN-DM 100-10 MG/5ML PO SYRP
5.0000 mL | ORAL_SOLUTION | ORAL | Status: DC | PRN
  Administered 2015-11-08 – 2015-11-10 (×2): 5 mL via ORAL
  Filled 2015-11-07 (×2): qty 10

## 2015-11-07 MED ORDER — ENOXAPARIN SODIUM 40 MG/0.4ML ~~LOC~~ SOLN
40.0000 mg | SUBCUTANEOUS | Status: DC
  Filled 2015-11-07: qty 0.4

## 2015-11-07 MED ORDER — WARFARIN SODIUM 2 MG PO TABS
2.0000 mg | ORAL_TABLET | ORAL | Status: AC
  Administered 2015-11-08: 2 mg via ORAL
  Filled 2015-11-07: qty 1

## 2015-11-07 MED ORDER — LEVOTHYROXINE SODIUM 100 MCG PO TABS
100.0000 ug | ORAL_TABLET | Freq: Every day | ORAL | Status: DC
  Administered 2015-11-08 – 2015-11-15 (×7): 100 ug via ORAL
  Filled 2015-11-07 (×8): qty 1

## 2015-11-07 MED ORDER — SODIUM CHLORIDE 0.9 % IV BOLUS (SEPSIS)
1000.0000 mL | Freq: Once | INTRAVENOUS | Status: AC
  Administered 2015-11-07: 1000 mL via INTRAVENOUS

## 2015-11-07 MED ORDER — DEXTROSE 5 % IV SOLN
500.0000 mg | Freq: Once | INTRAVENOUS | Status: AC
  Administered 2015-11-07: 500 mg via INTRAVENOUS
  Filled 2015-11-07: qty 500

## 2015-11-07 MED ORDER — ACETAMINOPHEN 325 MG PO TABS
650.0000 mg | ORAL_TABLET | Freq: Once | ORAL | Status: DC | PRN
Start: 2015-11-07 — End: 2015-11-07
  Filled 2015-11-07: qty 2

## 2015-11-07 MED ORDER — ONDANSETRON HCL 4 MG PO TABS
4.0000 mg | ORAL_TABLET | Freq: Four times a day (QID) | ORAL | Status: DC | PRN
  Administered 2015-11-08 – 2015-11-09 (×2): 4 mg via ORAL
  Filled 2015-11-07 (×2): qty 1

## 2015-11-07 MED ORDER — DEXTROSE 5 % IV SOLN
500.0000 mg | INTRAVENOUS | Status: DC
  Administered 2015-11-08 – 2015-11-11 (×4): 500 mg via INTRAVENOUS
  Filled 2015-11-07 (×4): qty 500

## 2015-11-07 MED ORDER — TRAMADOL HCL 50 MG PO TABS
50.0000 mg | ORAL_TABLET | Freq: Four times a day (QID) | ORAL | Status: DC | PRN
Start: 2015-11-07 — End: 2015-11-15
  Administered 2015-11-08 – 2015-11-11 (×4): 50 mg via ORAL
  Filled 2015-11-07 (×6): qty 1

## 2015-11-07 MED ORDER — WARFARIN SODIUM 3 MG PO TABS
3.0000 mg | ORAL_TABLET | ORAL | Status: DC
  Administered 2015-11-07: 3 mg via ORAL
  Filled 2015-11-07: qty 1

## 2015-11-07 MED ORDER — SERTRALINE HCL 50 MG PO TABS
50.0000 mg | ORAL_TABLET | Freq: Every day | ORAL | Status: DC
  Administered 2015-11-07 – 2015-11-15 (×9): 50 mg via ORAL
  Filled 2015-11-07 (×9): qty 1

## 2015-11-07 MED ORDER — GUAIFENESIN ER 600 MG PO TB12
600.0000 mg | ORAL_TABLET | Freq: Two times a day (BID) | ORAL | Status: DC
  Administered 2015-11-07 – 2015-11-10 (×6): 600 mg via ORAL
  Filled 2015-11-07 (×9): qty 1

## 2015-11-07 MED ORDER — POTASSIUM CHLORIDE CRYS ER 20 MEQ PO TBCR
20.0000 meq | EXTENDED_RELEASE_TABLET | Freq: Two times a day (BID) | ORAL | Status: DC
  Administered 2015-11-07 – 2015-11-15 (×15): 20 meq via ORAL
  Filled 2015-11-07 (×15): qty 1

## 2015-11-07 MED ORDER — PREGABALIN 75 MG PO CAPS
75.0000 mg | ORAL_CAPSULE | Freq: Two times a day (BID) | ORAL | Status: DC
  Administered 2015-11-07 – 2015-11-15 (×16): 75 mg via ORAL
  Filled 2015-11-07 (×16): qty 1

## 2015-11-07 MED ORDER — ALBUTEROL SULFATE (2.5 MG/3ML) 0.083% IN NEBU
2.5000 mg | INHALATION_SOLUTION | Freq: Three times a day (TID) | RESPIRATORY_TRACT | Status: DC
  Administered 2015-11-08: 2.5 mg via RESPIRATORY_TRACT
  Filled 2015-11-07 (×2): qty 3

## 2015-11-07 MED ORDER — AZITHROMYCIN 500 MG IV SOLR: 500.0000 mg | Freq: Once | INTRAVENOUS | Status: DC

## 2015-11-07 MED ORDER — FAMOTIDINE 20 MG PO TABS
20.0000 mg | ORAL_TABLET | Freq: Every day | ORAL | Status: DC
  Administered 2015-11-08 – 2015-11-09 (×2): 20 mg via ORAL
  Filled 2015-11-07 (×2): qty 1

## 2015-11-07 MED ORDER — DEXTROSE 5 % IV SOLN
1.0000 g | Freq: Once | INTRAVENOUS | Status: AC
  Administered 2015-11-07: 1 g via INTRAVENOUS
  Filled 2015-11-07: qty 10

## 2015-11-07 MED ORDER — ALBUTEROL SULFATE (2.5 MG/3ML) 0.083% IN NEBU
2.5000 mg | INHALATION_SOLUTION | Freq: Four times a day (QID) | RESPIRATORY_TRACT | Status: DC
  Administered 2015-11-07: 2.5 mg via RESPIRATORY_TRACT
  Filled 2015-11-07: qty 3

## 2015-11-07 MED ORDER — ALPRAZOLAM 0.5 MG PO TABS
0.5000 mg | ORAL_TABLET | Freq: Three times a day (TID) | ORAL | Status: DC | PRN
  Administered 2015-11-12: 0.5 mg via ORAL
  Filled 2015-11-07 (×2): qty 1

## 2015-11-07 MED ORDER — CEFTRIAXONE SODIUM 1 G IJ SOLR: 1.0000 g | Freq: Once | INTRAMUSCULAR | Status: DC

## 2015-11-07 MED ORDER — TRIAMCINOLONE ACETONIDE 0.1 % EX CREA
TOPICAL_CREAM | Freq: Three times a day (TID) | CUTANEOUS | Status: DC
  Administered 2015-11-15: 10:00:00 via TOPICAL
  Filled 2015-11-07: qty 15

## 2015-11-07 MED ORDER — NITROGLYCERIN 0.4 MG SL SUBL
0.4000 mg | SUBLINGUAL_TABLET | SUBLINGUAL | Status: DC | PRN
Start: 2015-11-07 — End: 2015-11-15

## 2015-11-07 MED ORDER — DEXTROSE 5 % IV SOLN
2.0000 g | INTRAVENOUS | Status: DC
  Administered 2015-11-08: 2 g via INTRAVENOUS
  Filled 2015-11-07: qty 2

## 2015-11-07 NOTE — ED Notes (Signed)
Report given-will transport to 4 th floor

## 2015-11-07 NOTE — Progress Notes (Signed)
Pharmacy Antibiotic Note  [name] is a 82 y.o. female admitted on 11/07/2015 with pneumonia and sepsis.  Pharmacy has been consulted for Zithromax/Rocephin dosing.  Plan: Will start Zithromax 500mg  IV q24 and Rocephin 2g IV q24 and they will be continued without further adjustments. Will sign off  Height: 5\' 1"  (154.9 cm) Weight: 151 lb 10.8 oz (68.8 kg) IBW/kg (Calculated) : 47.8  Temp (24hrs), Avg:100.1 F (37.8 C), Min:99.1 F (37.3 C), Max:102.1 F (38.9 C)   Recent Labs Lab 11/07/15 1228 11/07/15 1249 11/07/15 1629  WBC 15.0*  --   --   CREATININE 1.09*  --   --   LATICACIDVEN  --  2.13* 2.26*    Estimated Creatinine Clearance: 37.1 mL/min (by C-G formula based on Cr of 1.09).    Allergies  Allergen Reactions  . Amiodarone      Thyroid and liver and kidney problems  . Codeine Nausea And Vomiting  . Gabapentin Itching  . Hydrocodone Other (See Comments)    hallucination  . Morphine And Related Nausea And Vomiting  . Requip [Ropinirole Hcl]     Headache   . Zinc     nausea    Adrian Saran, PharmD, BCPS Pager [phone] 11/07/2015 5:13 PM

## 2015-11-07 NOTE — Progress Notes (Signed)
Remote pacemaker transmission.   

## 2015-11-07 NOTE — H&P (Signed)
[facility] History and Physical  [name] [id number] DOB: Jan 01, 1934 DOA: 11/07/2015  Referring physician: ED physician, Dr. Jeneen Rinks  PCP: [name], MD   Chief Complaint: dyspnea   HPI:  Pt is 82 yo female with multiple medical issues including PAF on chronic AC, HTN, Hypothyroidism, presented to [facility] ED with main concern of several days duration of progressively worsening dyspnea that initially started with exertion and has progressed to dyspnea at rest in the past 24 hours, associated with subjective fevers, chills, cough productive of yellow sputum, chest tightness that occurs with coughing spells, malaise, poor oral intake, wheezing, difficulty with ambulation due to dyspnea. She denies known sick contacts or exposures, no abd or urinary concerns, daughter called EMS and oxygen saturation noted to be 84% on RA.  In ED, pt noted to be hemodynamically stable but in mild distress due to dyspnea, VS notable for T 102 F, HR up to 127, RR > 22, WBC 15 K and lactic acid 2.13. CXR with no clear signs of infectious etiology. Pt started Rocephin and Zithromax and TRH asked to admit for further evaluation.   Assessment and Plan:  Principal Problem:   Sepsis ([place]) - please note that pt meets criteria for sepsis on admission and source suspected PNA - please note that no clear evidence on CXR but based on physical exam that is c/w dullness to percussion in bilateral lower lobes more on right side and with symptoms suggestive of it, agree with treating with Zithromax and Rocephin for now - sepsis and pneumonia order set in place  - sputum cultures requested, blood cultures - follow up on lactic acid and procalcitonin level - narrow down abx as clinically indicated   Active Problems:   Atrial fibrillation, CHADS2 vasc 4-5 ([place]) - keep on telemetry monitor  - continue Coumadin per pharmacy     Long term current use of anticoagulant therapy - INR at target range      Acute kidney injury ([place]) - appears to be pre renal from acute illness - BMP in AM    Hypothyroidism - continue home medical regimen     S/P right TKA    Cardiac pacemaker (06/2010)    Essential hypertension - reasonably stable on admission    Radiological Exams on Admission: Dg Chest 2 View  11/07/2015  CLINICAL DATA:  Productive cough with green sputum, shortness of breath and fever for 5 days. EXAM: CHEST  2 VIEW COMPARISON:  08/02/2014 FINDINGS: Stable appearance of the right dual chamber cardiac pacemaker. Heart size is within normal limits. Aortic arch is calcified. Lungs are clear without airspace disease or pulmonary edema. No acute bone abnormality. No pleural effusions. IMPRESSION: No active cardiopulmonary disease. Electronically Signed   By: Markus Daft M.D.   On: 11/07/2015 13:13     Code Status: Full Family Communication: Pt and daughter at bedside Disposition Plan: Admit for further evaluation    [facility] Pig[facility] [id number]   Review of Systems:  Constitutional: Negative for diaphoresis.  HENT: Negative for hearing loss, ear pain, neck pain, tinnitus and ear discharge.   Eyes: Negative for blurred vision, double vision, photophobia, pain, discharge and redness.  Respiratory: Negative for hemoptysis and stridor.   Cardiovascular: Negative for orthopnea, claudication and leg swelling.  Gastrointestinal: Negative for vomiting and abdominal pain.  Genitourinary: Negative for dysuria, urgency, frequency, hematuria and flank pain.  Musculoskeletal: Negative for myalgias, back pain, joint pain and falls.  Skin: Negative for itching and rash.  Neurological:  Negative for dizziness and weakness.  Endo/Heme/Allergies: Negative for environmental allergies and polydipsia. Does not bruise/bleed easily.  Psychiatric/Behavioral: Negative for suicidal ideas. The patient is not nervous/anxious.      Past Medical History  Diagnosis Date  . PAF (paroxysmal atrial fibrillation)  ([place])     controlled with amiodarone, on coumadin  . CAD (coronary artery disease)   . Aortic root dilatation ([place])   . Moderate aortic stenosis   . Chronic anticoagulation   . Tachycardia-bradycardia syndrome ([place])     s/p PPM by Dr Doreatha Lew (MDT) 07/03/10  . GERD (gastroesophageal reflux disease)   . Pacemaker     2012  . Neuropathy ([place])     feet/legs  . Restless leg syndrome   . Headache(784.0)     hx migraines - takes Zoloft for migraines  . History of uterine cancer 1980s    treated with hysterectomy, external radiation and radiation seed implants.   . Cancer ([place])     hx Kidney cancer / hx of endometrial cancer  . PONV (postoperative nausea and vomiting)   . Anxiety   . Heart murmur   . HTN (hypertension)   . Hyperlipemia   . Hypothyroidism   . Osteomyelitis ([place]) as child  . Family history of anesthesia complication     daughter has ponv  . Syncope 05/28/2014  . Hepatic steatosis   . Elevated LFTs   . Gastroparesis   . Anal fissure   . Radiation proctitis   . Gastric polyp     adenomatous  . Neuromuscular disorder (H[facility])     neuropathy in right leg  . Chronic kidney disease     only has one kidney   . 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 Cardiac catheterization  2008  . Nephrectomy Left     renal cell cancer  . Total abdominal hysterectomy    . Patent ductus arterious repair  age 43  . Ptvp    . Pacemaker insertion  07/04/11    by Dr Doreatha Lew  . Cholecystectomy  1970s  . Upper gastrointestinal endoscopy    . Total knee arthroplasty  02/07/2012    Procedure: TOTAL KNEE ARTHROPLASTY;  Surgeon: Mauri Pole, MD;  Location: [facility];  Service: Orthopedics;  Laterality: Right;  . Insert / replace / remove pacemaker    . Total hip revision Right 2009  . I&d knee with poly exchange Right 12/17/2013    Procedure: IRRIGATION AND DEBRIDEMEN RIGHT TOTAL  KNEE WITH POLY EXCHANGE;  Surgeon: Mauri Pole, MD;  Location: [facility]  [facility];  Service: Orthopedics;  Laterality: Right;  . Patellar tendon repair Right 03/06/2014    Procedure: AVULSION WITH PATELLA TENDON REPAIR;  Surgeon: Mauri Pole, MD;  Location: [facility];  Service: Orthopedics;  Laterality: Right;  . Total knee revision Right 07/29/2014    Procedure: RIGHT KNEE REVISION OF PREVIOUS REPAIR EXTENSOR MECHANISM;  Surgeon: Mauri Pole, MD;  Location: [facility];  Service: Orthopedics;  Laterality: Right;    Social History:  reports that she has never smoked. She has never used smokeless tobacco. She reports that she does not drink alcohol or use illicit drugs.  Allergies  Allergen Reactions  . Amiodarone      Thyroid and liver and kidney problems  . Codeine Nausea And Vomiting  . Gabapentin Itching  . Hydrocodone Other (See Comments)    hallucination  . Morphine And Related Nausea And Vomiting  .  Requip [Ropinirole Hcl]     Headache   . Zinc     nausea    Family History  Problem Relation Age of Onset  . Colon cancer Mother 41  . Heart disease Mother   . Heart disease Father   . Heart attack Father   . Heart disease Maternal Grandmother     Prior to Admission medications   Medication Sig Start Date End Date Taking? Authorizing Provider  ALPRAZolam Duanne Moron) 0.5 MG tablet TAKE 1/2 TO 1 TABLET BY MOUTH 3 TIMES A DAY AS NEEDED FOR ANXIETY 06/04/15 12/02/15 Yes [name], MD  atenolol (TENORMIN) 100 MG tablet Take 1 tablet (100 mg total) by mouth 2 (two) times daily. 05/19/15  Yes Darlin Coco, MD  Biotin 5000 MCG CAPS Take 10,000 mcg by mouth every morning.    Yes Historical Provider, MD  Cholecalciferol (VITAMIN D PO) Take 5,000 Units by mouth every morning.    Yes Historical Provider, MD  furosemide (LASIX) 40 MG tablet Take 20 mg by mouth daily.    Yes Historical Provider, MD  levothyroxine (SYNTHROID, LEVOTHROID) 100 MCG tablet TAKE 1 TABLET BY MOUTH EVERY DAY ON EMPTY STOMACH WITH A FULL GLASS OF WATER 10/25/15  Yes [name], MD   losartan (COZAAR) 100 MG tablet TAKE 1 TABLET (100 MG TOTAL) BY MOUTH DAILY. 06/23/15  Yes Burtis Junes, NP  nitroGLYCERIN (NITROSTAT) 0.4 MG SL tablet Place 1 tablet (0.4 mg total) under the tongue every 5 (five) minutes as needed for chest pain (MAX 3 TABLETS). 05/19/15  Yes Darlin Coco, MD  potassium chloride SA (K-DUR,KLOR-CON) 20 MEQ tablet Take 20 mEq by mouth 2 (two) times daily.   Yes Historical Provider, MD  pregabalin (LYRICA) 75 MG capsule Take 1 capsule (75 mg total) by mouth 2 (two) times daily. 05/26/15  Yes [name], MD  ranitidine (ZANTAC) 300 MG tablet Take 300 mg by mouth daily. Take 1-2 tablets by mouth daily as needed for heartburn or acid reflux   Yes Historical Provider, MD  sertraline (ZOLOFT) 50 MG tablet Take 1 tablet (50 mg total) by mouth daily. For mood 09/23/15  Yes [name], MD  traMADol (ULTRAM) 50 MG tablet Take 50 mg by mouth every 6 (six) hours as needed (pain). Reported on 10/14/2015   Yes Historical Provider, MD  triamcinolone cream (KENALOG) 0.1 % APPLY TO AFFECTED AREA TWICE A DAY 04/09/15  Yes [name], MD  warfarin (COUMADIN) 2 MG tablet Take as directed Patient taking differently: Take 2-3 mg by mouth daily. Take as directed: Take 3 mg on MWF and Take 2 mg on remaining days. 06/04/15 12/02/15 Yes [name], MD    Physical Exam: Filed Vitals:   11/07/15 1205 11/07/15 1333 11/07/15 1427 11/07/15 1430  BP:   100/62 108/70  Pulse:   102 100  Temp: 102.1 F (38.9 C) 99.2 F (37.3 C)    TempSrc: Rectal     Resp:   20 20  SpO2:   99% 98%    Physical Exam  Constitutional: Appears tired but not in acute distress  HENT: External right and left ear normal. Dry MM Eyes: Conjunctivae and EOM are normal. PERRLA, no scleral icterus.  Neck: Normal ROM. Neck supple. No JVD. No tracheal deviation. No thyromegaly.  CVS: IRRR, no gallops, no carotid bruit.  Pulmonary: Course breath sounds with mild exp wheezing and dullness to percussion  in lower lobes  Abdominal: Soft. BS +,  no distension, tenderness, rebound or guarding.  Musculoskeletal: Normal range of motion.  Lymphadenopathy: No lymphadenopathy noted, cervical, inguinal. Neuro: Alert. Normal reflexes, muscle tone coordination. Skin: Skin is warm and dry. No rash noted.  Psychiatric: Normal mood and affect. Behavior, judgment, thought content normal.   Labs on Admission:  Basic Metabolic Panel:  Recent Labs Lab 11/07/15 1228  NA 139  K 3.8  CL 114*  CO2 18*  GLUCOSE 140*  BUN 19  CREATININE 1.09*  CALCIUM 8.1*   Liver Function Tests:  Recent Labs Lab 11/07/15 1228  AST 18  ALT 12*  ALKPHOS 107  BILITOT 0.4  PROT 6.3*  ALBUMIN 3.3*   CBC:  Recent Labs Lab 11/07/15 1228  WBC 15.0*  NEUTROABS 13.7*  HGB 10.2*  HCT 32.7*  MCV 86.7  PLT 238    EKG: pending   If 7PM-7AM, please contact night-coverage [email or web address] Password [email or web address] 11/07/2015, 3:29 PM

## 2015-11-07 NOTE — ED Notes (Signed)
Patient unable to provide urine specimen at this time.

## 2015-11-07 NOTE — ED Notes (Signed)
Bed: WA03 Expected date:  Expected time:  Means of arrival:  Comments: EMS- 81 yo febrile

## 2015-11-07 NOTE — Progress Notes (Signed)
PATIENT IS A JEHOVAH'S WITNESS-NO BLOOD PRODUCTS

## 2015-11-07 NOTE — ED Provider Notes (Signed)
CSN: [id number]     Arrival date & time 11/07/15  1148 History   First MD Initiated Contact with Patient 11/07/15 1153     Chief Complaint  Patient presents with  . Fever     HPI  Patient presents evaluation of lethargy and fever. Completed family. Lives at home with daughter. They describe cold symptoms with URI symptoms and no fever for the last 4-5 days. This morning with increased coughing and shortness of breath, general weakness, fevers and chills. Transferred by EMS. Hypoxemic at 84 on room air. Up to 94% on 2 L.   History of coronary artery disease. This takes Lasix, this history of CHF, but most recent EF 60-65% on echo. Grade 2 diastolic dysfunction. Past Medical History  Diagnosis Date  . PAF (paroxysmal atrial fibrillation) ([place])     controlled with amiodarone, on coumadin  . CAD (coronary artery disease)   . Aortic root dilatation ([place])   . Moderate aortic stenosis   . Chronic anticoagulation   . Tachycardia-bradycardia syndrome ([place])     s/p PPM by Dr Doreatha Lew (MDT) 07/03/10  . GERD (gastroesophageal reflux disease)   . Pacemaker     2012  . Neuropathy ([place])     feet/legs  . Restless leg syndrome   . Headache(784.0)     hx migraines - takes Zoloft for migraines  . History of uterine cancer 1980s    treated with hysterectomy, external radiation and radiation seed implants.   . Cancer ([place])     hx Kidney cancer / hx of endometrial cancer  . PONV (postoperative nausea and vomiting)   . Anxiety   . Heart murmur   . HTN (hypertension)   . Hyperlipemia   . Hypothyroidism   . Osteomyelitis ([place]) as child  . Family history of anesthesia complication     daughter has ponv  . Syncope 05/28/2014  . Hepatic steatosis   . Elevated LFTs   . Gastroparesis   . Anal fissure   . Radiation proctitis   . Gastric polyp     adenomatous  . Neuromuscular disorder (H[facility])     neuropathy in right leg  . Chronic kidney disease     only has one kidney   . Bright disease as  child   Past Surgical History  Procedure Laterality Date  . Cardiac catheterization  2008  . Nephrectomy Left     renal cell cancer  . Total abdominal hysterectomy    . Patent ductus arterious repair  age 12  . Ptvp    . Pacemaker insertion  07/04/11    by Dr Doreatha Lew  . Cholecystectomy  1970s  . Upper gastrointestinal endoscopy    . Total knee arthroplasty  02/07/2012    Procedure: TOTAL KNEE ARTHROPLASTY;  Surgeon: Mauri Pole, MD;  Location: [facility];  Service: Orthopedics;  Laterality: Right;  . Insert / replace / remove pacemaker    . Total hip revision Right 2009  . I&d knee with poly exchange Right 12/17/2013    Procedure: IRRIGATION AND DEBRIDEMEN RIGHT TOTAL  KNEE WITH POLY EXCHANGE;  Surgeon: Mauri Pole, MD;  [facility]: [facility];  Service: Orthopedics;  Laterality: Right;  . Patellar tendon repair Right 03/06/2014    Procedure: AVULSION WITH PATELLA TENDON REPAIR;  Surgeon: Mauri Pole, MD;  Location: [facility];  Service: Orthopedics;  Laterality: Right;  . Total knee revision Right 07/29/2014    Procedure: RIGHT KNEE REVISION OF PREVIOUS REPAIR EXTENSOR MECHANISM;  Surgeon:  Mauri Pole, MD;  Location[facility];  Service: Orthopedics;  Laterality: Right;   Family History  Problem Relation Age of Onset  . Colon cancer Mother 76  . Heart disease Mother   . Heart disease Father   . Heart attack Father   . Heart disease Maternal Grandmother    Social History  Substance Use Topics  . Smoking status: Never Smoker   . Smokeless tobacco: Never Used  . Alcohol Use: No   OB History    No data available     Review of Systems  Constitutional: Positive for fever, chills and fatigue. Negative for diaphoresis and appetite change.  HENT: Positive for postnasal drip and rhinorrhea. Negative for mouth sores, sore throat and trouble swallowing.   Eyes: Negative for visual disturbance.  Respiratory: Positive for shortness of breath. Negative for cough, chest tightness and wheezing.    Cardiovascular: Negative for chest pain.  Gastrointestinal: Negative for nausea, vomiting, abdominal pain, diarrhea and abdominal distention.  Endocrine: Negative for polydipsia, polyphagia and polyuria.  Genitourinary: Negative for dysuria, frequency and hematuria.  Musculoskeletal: Negative for gait problem.  Skin: Negative for color change, pallor and rash.  Neurological: Positive for weakness. Negative for dizziness, syncope, light-headedness and headaches.  Hematological: Does not bruise/bleed easily.  Psychiatric/Behavioral: Negative for behavioral problems and confusion.      Allergies  Amiodarone; Codeine; Gabapentin; Hydrocodone; Morphine and related; Requip; and Zinc  Home Medications   Prior to Admission medications   Medication Sig Start Date End Date Taking? Authorizing Provider  ALPRAZolam Duanne Moron) 0.5 MG tablet TAKE 1/2 TO 1 TABLET BY MOUTH 3 TIMES A DAY AS NEEDED FOR ANXIETY 06/04/15 12/02/15 Yes [name], MD  atenolol (TENORMIN) 100 MG tablet Take 1 tablet (100 mg total) by mouth 2 (two) times daily. 05/19/15  Yes Darlin Coco, MD  Biotin 5000 MCG CAPS Take 10,000 mcg by mouth every morning.    Yes Historical Provider, MD  Cholecalciferol (VITAMIN D PO) Take 5,000 Units by mouth every morning.    Yes Historical Provider, MD  furosemide (LASIX) 40 MG tablet Take 20 mg by mouth daily.    Yes Historical Provider, MD  levothyroxine (SYNTHROID, LEVOTHROID) 100 MCG tablet TAKE 1 TABLET BY MOUTH EVERY DAY ON EMPTY STOMACH WITH A FULL GLASS OF WATER 10/25/15  Yes [name], MD  losartan (COZAAR) 100 MG tablet TAKE 1 TABLET (100 MG TOTAL) BY MOUTH DAILY. 06/23/15  Yes Burtis Junes, NP  nitroGLYCERIN (NITROSTAT) 0.4 MG SL tablet Place 1 tablet (0.4 mg total) under the tongue every 5 (five) minutes as needed for chest pain (MAX 3 TABLETS). 05/19/15  Yes Darlin Coco, MD  potassium chloride SA (K-DUR,KLOR-CON) 20 MEQ tablet Take 20 mEq by mouth 2 (two) times  daily.   Yes Historical Provider, MD  pregabalin (LYRICA) 75 MG capsule Take 1 capsule (75 mg total) by mouth 2 (two) times daily. 05/26/15  Yes [name], MD  ranitidine (ZANTAC) 300 MG tablet Take 300 mg by mouth daily. Take 1-2 tablets by mouth daily as needed for heartburn or acid reflux   Yes Historical Provider, MD  sertraline (ZOLOFT) 50 MG tablet Take 1 tablet (50 mg total) by mouth daily. For mood 09/23/15  Yes [name], MD  traMADol (ULTRAM) 50 MG tablet Take 50 mg by mouth every 6 (six) hours as needed (pain). Reported on 10/14/2015   Yes Historical Provider, MD  triamcinolone cream (KENALOG) 0.1 % APPLY TO AFFECTED AREA TWICE A DAY 04/09/15  Yes [name], MD  warfarin (COUMADIN) 2 MG tablet Take as directed Patient taking differently: Take 2-3 mg by mouth daily. Take as directed: Take 3 mg on MWF and Take 2 mg on remaining days. 06/04/15 12/02/15 Yes [name], MD   BP 100/62 mmHg  Pulse 102  Temp(Src) 99.2 F (37.3 C) (Rectal)  Resp 20  SpO2 99%  LMP  (Exact Date) Physical Exam  Constitutional: She is oriented to person, place, and time. She appears well-developed and well-nourished. She appears ill. No distress.  Elderly female. Dyspneic. Able to offer details of her own history.  HENT:  Head: Normocephalic.  Eyes: Conjunctivae are normal. Pupils are equal, round, and reactive to light. No scleral icterus.  Neck: Normal range of motion. Neck supple. No thyromegaly present.  Cardiovascular: Normal rate and regular rhythm.  Exam reveals no gallop and no friction rub.   No murmur heard. A. fib with rapid response on the monitor 1:30. After temperature improved his heart rate is improved to 105.  Pulmonary/Chest: Effort normal and breath sounds normal. No respiratory distress. She has no wheezes. She has no rales.  Diminished left basilar breath sounds crackles. Tachypnea.  Abdominal: Soft. Bowel sounds are normal. She exhibits no distension. There is no  tenderness. There is no rebound.  Musculoskeletal: Normal range of motion.  Neurological: She is alert and oriented to person, place, and time.  Skin: Skin is warm and dry. No rash noted.  Psychiatric: She has a normal mood and affect. Her behavior is normal.    ED Course  Procedures (including critical care time) Labs Review Labs Reviewed  COMPREHENSIVE METABOLIC PANEL - Abnormal; Notable for the following:    Chloride 114 (*)    CO2 18 (*)    Glucose, Bld 140 (*)    Creatinine, Ser 1.09 (*)    Calcium 8.1 (*)    Total Protein 6.3 (*)    Albumin 3.3 (*)    ALT 12 (*)    GFR calc non Af Amer 47 (*)    GFR calc Af Amer 54 (*)    All other components within normal limits  CBC WITH DIFFERENTIAL/PLATELET - Abnormal; Notable for the following:    WBC 15.0 (*)    RBC 3.77 (*)    Hemoglobin 10.2 (*)    HCT 32.7 (*)    Neutro Abs 13.7 (*)    All other components within normal limits  I-STAT CG4 LACTIC ACID, ED - Abnormal; Notable for the following:    Lactic Acid, Venous 2.13 (*)    All other components within normal limits  CULTURE, BLOOD (ROUTINE X 2)  CULTURE, BLOOD (ROUTINE X 2)  URINE CULTURE  URINALYSIS, ROUTINE W REFLEX MICROSCOPIC (NOT AT [facility])  INFLUENZA PANEL BY PCR (TYPE A & B, H1N1)  I-STAT CG4 LACTIC ACID, ED    Imaging Review Dg Chest 2 View  11/07/2015  CLINICAL DATA:  Productive cough with green sputum, shortness of breath and fever for 5 days. EXAM: CHEST  2 VIEW COMPARISON:  08/02/2014 FINDINGS: Stable appearance of the right dual chamber cardiac pacemaker. Heart size is within normal limits. Aortic arch is calcified. Lungs are clear without airspace disease or pulmonary edema. No acute bone abnormality. No pleural effusions. IMPRESSION: No active cardiopulmonary disease. Electronically Signed   By: Markus Daft M.D.   On: 11/07/2015 13:13   I have personally reviewed and evaluated these images and lab results as part of my medical decision-making.   EKG  Interpretation None      MDM   Final diagnoses:  Fever, unspecified fever cause  Hypoxia  Sepsis, due to unspecified organism [facility])    Patient given IV fluids. Initial lactate elevated 2.1. Was made a code sepsis. Chest x-ray does not show obvious infiltrate. Clinical exam suggests left lower lobe pneumonia. Influenza panel requested. Given Zithromax and Rocephin IV. Care discussed with Dr. Doyle Askew. Patient will be admitted.    Tanna Furry, MD 11/07/15 [phone]

## 2015-11-07 NOTE — ED Notes (Signed)
MD made aware of elevated lactic acid. 

## 2015-11-07 NOTE — Progress Notes (Signed)
ANTICOAGULATION CONSULT NOTE - Initial Consult  Pharmacy Consult for warfarin Indication: atrial fibrillation  Allergies  Allergen Reactions  . Amiodarone      Thyroid and liver and kidney problems  . Codeine Nausea And Vomiting  . Gabapentin Itching  . Hydrocodone Other (See Comments)    hallucination  . Morphine And Related Nausea And Vomiting  . Requip [Ropinirole Hcl]     Headache   . Zinc     nausea    Patient Measurements: Height: 5\' 1"  (154.9 cm) Weight: 151 lb 10.8 oz (68.8 kg) IBW/kg (Calculated) : 47.8  Vital Signs: Temp: 99.1 F (37.3 C) (03/03 1700) Temp Source: Oral (03/03 1700) BP: 116/69 mmHg (03/03 1700) Pulse Rate: 81 (03/03 1700)  Labs:  Recent Labs  11/07/15 1228 11/07/15 1740  HGB 10.2*  --   HCT 32.7*  --   PLT 238  --   APTT  --  44*  LABPROT  --  25.1*  INR  --  2.31*  CREATININE 1.09*  --     Estimated Creatinine Clearance: 37.1 mL/min (by C-G formula based on Cr of 1.09).   Medical History: Past Medical History  Diagnosis Date  . PAF (paroxysmal atrial fibrillation) ([place])     controlled with amiodarone, on coumadin  . CAD (coronary artery disease)   . Aortic root dilatation ([place])   . Moderate aortic stenosis   . Chronic anticoagulation   . Tachycardia-bradycardia syndrome ([place])     s/p PPM by Dr Doreatha Lew (MDT) 07/03/10  . GERD (gastroesophageal reflux disease)   . Pacemaker     2012  . Neuropathy ([place])     feet/legs  . Restless leg syndrome   . Headache(784.0)     hx migraines - takes Zoloft for migraines  . History of uterine cancer 1980s    treated with hysterectomy, external radiation and radiation seed implants.   . Cancer ([place])     hx Kidney cancer / hx of endometrial cancer  . PONV (postoperative nausea and vomiting)   . Anxiety   . Heart murmur   . HTN (hypertension)   . Hyperlipemia   . Hypothyroidism   . Osteomyelitis ([place]) as child  . Family history of anesthesia complication     daughter has ponv  .  Syncope 05/28/2014  . Hepatic steatosis   . Elevated LFTs   . Gastroparesis   . Anal fissure   . Radiation proctitis   . Gastric polyp     adenomatous  . Neuromuscular disorder (HCC)     neuropathy in right leg  . Chronic kidney disease     only has one kidney   . 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 albuterol  2.5 mg Nebulization Q6H  . [START ON 11/08/2015] azithromycin  500 mg Intravenous Q24H  . [START ON 11/08/2015] cefTRIAXone (ROCEPHIN)  IV  2 g Intravenous Q24H  . [START ON 11/08/2015] famotidine  20 mg Oral Daily  . guaiFENesin  600 mg Oral BID  . [START ON 11/08/2015] levothyroxine  100 mcg Oral QAC breakfast  . potassium chloride SA  20 mEq Oral BID  . pregabalin  75 mg Oral BID  . sertraline  50 mg Oral Daily  . triamcinolone cream   Topical TID  . [START ON 11/08/2015] warfarin  2 mg Oral Once per day on Sun Tue Thu Sat  . warfarin  3 mg Oral Q M,W,F-1800  . [START ON 11/08/2015] Warfarin -  Physician Dosing Inpatient   Does not apply q1800   Infusions:  . sodium chloride 75 mL/hr at 11/07/15 1852    Assessment: Patient already known to pharmacy for dosing of antibiotics. Patient on warfarin PTA for hx Afib. To continue this while inpatient per pharmacy dosing. INR therapeutic on admission. Will continue home dose for now and recheck INR in AM  Goal of Therapy:  INR 2-3   Plan:  1) Home regimen of 2mg  daily except 3mg  MWF restarted with 3mg  to be given tonight 2) Daily INR   Adrian Saran, PharmD, BCPS Pager [phone] 11/07/2015 7:43 PM

## 2015-11-07 NOTE — ED Notes (Signed)
Per EMS-states cold symptoms since Sunday-treating with OTC meds-fever this am-coughing up mucous-gave 500 cc NS bolus in route

## 2015-11-07 NOTE — ED Notes (Signed)
abnormal lab MD Darl Householder aware

## 2015-11-08 ENCOUNTER — Inpatient Hospital Stay (HOSPITAL_COMMUNITY): Payer: Medicare Other

## 2015-11-08 DIAGNOSIS — D509 Iron deficiency anemia, unspecified: Secondary | ICD-10-CM | POA: Diagnosis present

## 2015-11-08 LAB — BASIC METABOLIC PANEL
Anion gap: 7 (ref 5–15)
BUN: 13 mg/dL (ref 6–20)
CHLORIDE: 117 mmol/L — AB (ref 101–111)
CO2: 19 mmol/L — AB (ref 22–32)
Calcium: 8.2 mg/dL — ABNORMAL LOW (ref 8.9–10.3)
Creatinine, Ser: 0.87 mg/dL (ref 0.44–1.00)
GFR calc Af Amer: >60 mL/min (ref >60)
GLUCOSE: 103 mg/dL — AB (ref 65–99)
POTASSIUM: 3.6 mmol/L (ref 3.5–5.1)
Sodium: 143 mmol/L (ref 135–145)

## 2015-11-08 LAB — CBC
HEMATOCRIT: 26 % — AB (ref 36.0–46.0)
Hemoglobin: 8.2 g/dL — ABNORMAL LOW (ref 12.0–15.0)
MCH: 27.3 pg (ref 26.0–34.0)
MCHC: 31.5 g/dL (ref 30.0–36.0)
MCV: 86.7 fL (ref 78.0–100.0)
Platelets: 196 10*3/uL (ref 150–400)
RBC: 3 MIL/uL — ABNORMAL LOW (ref 3.87–5.11)
RDW: 15 % (ref 11.5–15.5)
WBC: 15 10*3/uL — ABNORMAL HIGH (ref 4.0–10.5)

## 2015-11-08 LAB — OCCULT BLOOD X 1 CARD TO LAB, STOOL: FECAL OCCULT BLD: POSITIVE — AB

## 2015-11-08 LAB — PROTIME-INR
INR: 2.48 — ABNORMAL HIGH (ref 0.00–1.49)
Prothrombin Time: 26.5 seconds — ABNORMAL HIGH (ref 11.6–15.2)

## 2015-11-08 LAB — STREP PNEUMONIAE URINARY ANTIGEN: STREP PNEUMO URINARY ANTIGEN: NEGATIVE

## 2015-11-08 MED ORDER — PIPERACILLIN-TAZOBACTAM 3.375 G IVPB 30 MIN
3.3750 g | Freq: Three times a day (TID) | INTRAVENOUS | Status: DC
Start: 2015-11-08 — End: 2015-11-15
  Administered 2015-11-08 – 2015-11-15 (×21): 3.375 g via INTRAVENOUS
  Filled 2015-11-08 (×22): qty 50

## 2015-11-08 MED ORDER — VANCOMYCIN HCL 500 MG IV SOLR
500.0000 mg | Freq: Two times a day (BID) | INTRAVENOUS | Status: DC
  Administered 2015-11-09 – 2015-11-11 (×6): 500 mg via INTRAVENOUS
  Filled 2015-11-08 (×7): qty 500

## 2015-11-08 MED ORDER — ATENOLOL 100 MG PO TABS
100.0000 mg | ORAL_TABLET | Freq: Two times a day (BID) | ORAL | Status: DC
  Administered 2015-11-08 – 2015-11-15 (×14): 100 mg via ORAL
  Filled 2015-11-08 (×14): qty 1

## 2015-11-08 MED ORDER — VANCOMYCIN HCL IN DEXTROSE 1-5 GM/200ML-% IV SOLN
1000.0000 mg | Freq: Once | INTRAVENOUS | Status: AC
  Administered 2015-11-08: 1000 mg via INTRAVENOUS
  Filled 2015-11-08: qty 200

## 2015-11-08 MED ORDER — LEVALBUTEROL HCL 0.63 MG/3ML IN NEBU
0.6300 mg | INHALATION_SOLUTION | Freq: Three times a day (TID) | RESPIRATORY_TRACT | Status: DC
  Administered 2015-11-08 – 2015-11-11 (×7): 0.63 mg via RESPIRATORY_TRACT
  Filled 2015-11-08 (×7): qty 3

## 2015-11-08 NOTE — Progress Notes (Signed)
CRITICAL VALUE ALERT  Critical value received:  Gram cocci in chains  Date of notification:  11/08/15  Time of notification:  [id number]  Critical value read back:Yes.    Nurse who received alert:  Virgina Norfolk  MD notified (1st page):  [place]  Time of first page:  0451  MD notified (2nd page):  Time of second page:  Responding MD:  None  Time MD responded:  n/a

## 2015-11-08 NOTE — Progress Notes (Signed)
ANTICOAGULATION CONSULT NOTE - Initial Consult  Pharmacy Consult for warfarin Indication: atrial fibrillation  Allergies  Allergen Reactions  . Amiodarone      Thyroid and liver and kidney problems  . Codeine Nausea And Vomiting  . Gabapentin Itching  . Hydrocodone Other (See Comments)    hallucination  . Morphine And Related Nausea And Vomiting  . Requip [Ropinirole Hcl]     Headache   . Zinc     nausea   Patient Measurements: Height: 5\' 1"  (154.9 cm) Weight: 151 lb 10.8 oz (68.8 kg) IBW/kg (Calculated) : 47.8  Vital Signs: Temp: 98.6 F (37 C) (03/04 0625) Temp Source: Oral (03/04 0625) BP: 95/62 mmHg (03/04 0625) Pulse Rate: 101 (03/04 0757)  Labs:  Recent Labs  11/07/15 1228 11/07/15 1740 11/08/15 0554  HGB 10.2*  --  8.2*  HCT 32.7*  --  26.0*  PLT 238  --  196  APTT  --  44*  --   LABPROT  --  25.1* 26.5*  INR  --  2.31* 2.48*  CREATININE 1.09*  --  0.87   Estimated Creatinine Clearance: 46.5 mL/min (by C-G formula based on Cr of 0.87).  Medications:  Scheduled:  . albuterol  2.5 mg Nebulization TID  . azithromycin  500 mg Intravenous Q24H  . cefTRIAXone (ROCEPHIN)  IV  2 g Intravenous Q24H  . famotidine  20 mg Oral Daily  . guaiFENesin  600 mg Oral BID  . levothyroxine  100 mcg Oral QAC breakfast  . potassium chloride SA  20 mEq Oral BID  . pregabalin  75 mg Oral BID  . sertraline  50 mg Oral Daily  . triamcinolone cream   Topical TID  . warfarin  2 mg Oral Once per day on Sun Tue Thu Sat  . warfarin  3 mg Oral Q M,W,F-1800  . Warfarin - Physician Dosing Inpatient   Does not apply q1800   Assessment: Patient already known to pharmacy for dosing of antibiotics. Patient on warfarin PTA for hx Afib. To continue this while inpatient per pharmacy dosing. INR therapeutic on admission. Will continue home dose for now and recheck INR in AM. Home regimen of 2mg  daily except 3mg  MWF, admit INR 2.31.  Today, 11/08/2015 INR 2.48, tolerating diet H/H  somewhat decreased, possibly dilutional  Goal of Therapy:  INR 2-3   Plan:   Continue home regimen; Warfarin 2mg  today  Daily PT/INR  Monitor CBC  Minda Ditto PharmD Pager [phone] 11/08/2015, 8:29 AM

## 2015-11-08 NOTE — Evaluation (Signed)
Physical Therapy Evaluation Patient Details Name: [name] MRN: [id number] DOB: 1934-06-21 Today's Date: 11/08/2015   History of Present Illness  82 yo female admitted with sepsis. Hx of PAF, CAD, tachy-brady syndrome, aortic stenosis, neuropathy, RLS, uterine cancer, osteomyelitis, syncope, CKD, TKA, R patella tendon repair 07/2014  Clinical Impression  On eval, pt required Min assist for mobility-only able to tolerate taking a few steps along the side of bed with quad cane. Pain rated 8/10 R LE with any activity/WBing. Antalgic gait pattern with each step. Dyspnea 3/4. HR as high as 140s with minimal activity. Encouraged pt/daughter to discuss R knee concerns with MD on today. (daughter concerned pt may have another infection). O2 sats >90% on RA during session. Replaced [place] O2 at end of session. Will follow and progress activity as able. At this time, will recommend HHPT depending on progress.   Follow Up Recommendations Home health PT;Supervision/Assistance - 24 hour    Equipment Recommendations  None recommended by PT    Recommendations for Other Services       Precautions / Restrictions Precautions Precautions: Fall Precaution Comments: monitor sats, HR Restrictions Weight Bearing Restrictions: No      Mobility  Bed Mobility Overal bed mobility: Needs Assistance Bed Mobility: Supine to Sit     Supine to sit: Min assist;HOB elevated     General bed mobility comments: assist to get to EOB. Effortful for pt. Increased time.   Transfers Overall transfer level: Needs assistance Equipment used: Quad cane Transfers: Sit to/from Stand Sit to Stand: Min guard         General transfer comment: close guard for safety.   Ambulation/Gait Ambulation/Gait assistance: Min assist   Assistive device: Quad cane       General Gait Details: Side steps towards HOB with quad cane. Antalgic R LE. Only able to tolerate those few steps.   Stairs            Wheelchair  Mobility    Modified Rankin (Stroke Patients Only)       Balance Overall balance assessment: Needs assistance         Standing balance support: During functional activity Standing balance-Leahy Scale: Poor                               Pertinent Vitals/Pain Pain Assessment: 0-10 Pain Score: 8  Pain Location: R LE/knee with activity/WBing Pain Descriptors / Indicators: Aching;Sore Pain Intervention(s): Limited activity within patient's tolerance;Repositioned    Home Living Family/patient expects to be discharged to:: Private residence Living Arrangements: Children Available Help at Discharge: Family Type of Home: House Home Access: Stairs to enter   Technical brewer of Steps: 1 Home Layout: Multi-level Home Equipment: Environmental consultant - 2 wheels;Cane - quad;Grab bars - toilet;Grab bars - tub/shower;Wheelchair - manual      Prior Function Level of Independence: Independent with assistive device(s)         Comments: ambulatory with quad cane     Hand Dominance        Extremity/Trunk Assessment   Upper Extremity Assessment: Generalized weakness           Lower Extremity Assessment: Generalized weakness;RLE deficits/detail RLE Deficits / Details: Limited knee ROM-flex ~10 degrees at baseline. Pain and TTP posterior knee.    Cervical / Trunk Assessment: Normal  Communication   Communication: HOH  Cognition Arousal/Alertness: Awake/alert Behavior During Therapy: WFL for tasks assessed/performed Overall Cognitive Status: Within Functional  Limits for tasks assessed                      General Comments      Exercises        Assessment/Plan    PT Assessment Patient needs continued PT services  PT Diagnosis Difficulty walking;Abnormality of gait;Acute pain;Generalized weakness   PT Problem List Decreased strength;Decreased activity tolerance;Decreased range of motion;Decreased balance;Decreased mobility;Pain;Cardiopulmonary  status limiting activity  PT Treatment Interventions DME instruction;Gait training;Functional mobility training;Therapeutic activities;Therapeutic exercise;Balance training;Patient/family education   PT Goals (Current goals can be found in the Care Plan section) Acute Rehab PT Goals Patient Stated Goal: less pain. get stronger PT Goal Formulation: With patient/family Time For Goal Achievement: 11/22/15 Potential to Achieve Goals: Fair    Frequency Min 3X/week   Barriers to discharge        Co-evaluation               End of Session   Activity Tolerance: Patient limited by fatigue;Patient limited by pain Patient left: in bed;with family/visitor present           Time: [id number] PT Time Calculation (min) (ACUTE ONLY): 19 min   Charges:   PT Evaluation $PT Eval Moderate Complexity: 1 Procedure     PT G Codes:        Weston Anna, MPT Pager: [phone]

## 2015-11-08 NOTE — Progress Notes (Addendum)
[facility] PROGRESS NOTE  GENESIS AYLSWORTH [id number] DOB: 28-Dec-1933 DOA: 11/07/2015 PCP: [name], MD  Summary 11/08/15: I have seen and examined [name] at bedside in the presence of her daughter and reviewed her chart. [name] is the pleasant 82 yo female with multiple medical issues including PAF on chronic anticoagulation, essential htn, Hypothyroidism, history of right total knee replacement(?with superinfection needing "cleaning up"), who presented to [facility] ED with progressively worsening dyspnea associated with subjective fevers, chills, cough productive of yellow sputum, chest tightness that occurs with coughing spells, malaise, poor oral intake, wheezing, difficulty with ambulation due to dyspnea. She was found to be septic with suggestion of pneumonia, and to have afib with rvr. CT chest showed "Bronchitic changes with bibasilar atelectasis. Single nonspecific minimally enlarged precarinal lymph node..."  Her white count continues to increase despite Ceftriaxone/Zithromax. She still has fever. She also complains of right knee pain and this raises concern for superinfection. An XRay for the right knee shows "Mild anterior soft tissue swelling.  No other abnormalities". Blood culture results pending. Will broaden antibiotics to Vancomycin/Zosyn Zithromax pending septic workup since she is to have fever. Will also check sedimentation rate/CRP. Will obtain CT of the knee If sed rate/crp elevated. Will resume atenolol for rate control, and continue Coumadin per pharmacy targets INR 2-3. Plan Sepsis (HCC)/Pneumonia  Follow septic workup  Change antibiotics to Vancomycin/Zosyn/Zithromax Right knee pain/S/P right TKA  Sedimentation rate/CRP  CT of the right knee if sedimentation rate/CRP elevated, and possibly orthopedics consultation also. Atrial fibrillation (HCC)/Cardiac pacemaker (06/2010)/Essential hypertension/Long term current use of anticoagulant  therapy  Resume atenolol  Coumadin per pharmacy for target INR 2-3. Appreciate pharmacy. Hypothyroidism  TSH normal  Continue Synthroid Acute kidney injury ([place])    Renal function better  Monitor Normocytic anemia  ?Baseline hemoglobin, ?cause  Anemia panel.  Stool hemeoccult  Monitor, no evidence of overt bleeding, hemodilution element Code Status: Full Code Family Communication: Patient and daughter at the bedside Disposition Plan: Home eventually   Consultants:    Procedures:    Antibiotics:  Ceftriaxone 11/07/15>>11/08/15  Vancomycin 11/08/15>>  Zosyn 11/08/15>>  Zithromax 11/08/15>>  HPI/Subjective: Complains of pain behind the right knee.  Objective: Filed Vitals:   11/08/15 0757 11/08/15 1438  BP:  121/64  Pulse: 101 127  Temp:  97.9 F (36.6 C)  Resp: 18 20    Intake/Output Summary (Last 24 hours) at 11/08/15 1649 Last data filed at 11/08/15 1439  Gross per 24 hour  Intake   1630 ml  Output    700 ml  Net    930 ml   Filed Weights   11/07/15 1700  Weight: 68.8 kg (151 lb 10.8 oz)    Exam:   General:  Comfortable at rest.  Cardiovascular: S1-S2 normal. No murmurs. Pulse regular.  Respiratory: Good air entry bilaterally. No rhonchi or rales.  Abdomen: Soft and nontender. Normal bowel sounds. No organomegaly.  Musculoskeletal: No pedal edema   Neurological: Intact  Data Reviewed: Basic Metabolic Panel:  Recent Labs Lab 11/07/15 1228 11/07/15 1740 11/08/15 0554  NA 139  --  143  K 3.8  --  3.6  CL 114*  --  117*  CO2 18*  --  19*  GLUCOSE 140*  --  103*  BUN 19  --  13  CREATININE 1.09*  --  0.87  CALCIUM 8.1*  --  8.2*  MG  --  1.5*  --    Liver Function Tests:  Recent Labs  Lab 11/07/15 1228  AST 18  ALT 12*  ALKPHOS 107  BILITOT 0.4  PROT 6.3*  ALBUMIN 3.3*   No results for input(s): LIPASE, AMYLASE in the last 168 hours. No results for input(s): AMMONIA in the last 168 hours. CBC:  Recent Labs Lab  11/07/15 1228 11/08/15 0554  WBC 15.0* 15.0*  NEUTROABS 13.7*  --   HGB 10.2* 8.2*  HCT 32.7* 26.0*  MCV 86.7 86.7  PLT 238 196   Cardiac Enzymes: No results for input(s): CKTOTAL, CKMB, CKMBINDEX, TROPONINI in the last 168 hours. BNP (last 3 results) No results for input(s): BNP in the last 8760 hours.  ProBNP (last 3 results) No results for input(s): PROBNP in the last 8760 hours.  CBG: No results for input(s): GLUCAP in the last 168 hours.  Recent Results (from the past 240 hour(s))  Culture, blood (routine x 2)     Status: None (Preliminary result)   Collection Time: 11/07/15 12:28 PM  Result Value Ref Range Status   Specimen Description BLOOD RIGHT ANTECUBITAL  Final   Special Requests IN PEDIATRIC BOTTLE 4ML  Final   Culture   Final    NO GROWTH 1 DAY Performed at [facility]    Report Status PENDING  Incomplete  Culture, blood (routine x 2)     Status: None (Preliminary result)   Collection Time: 11/07/15 12:29 PM  Result Value Ref Range Status   Specimen Description BLOOD LEFT HAND  Final   Special Requests IN PEDIATRIC BOTTLE 2ML  Final   Culture  Setup Time   Final    GRAM POSITIVE COCCI IN CHAINS IN PEDIATRIC BOTTLE CRITICAL RESULT CALLED TO, READ BACK BY AND VERIFIED WITH: C ADDISON,RN @0449  11/08/15 MKELLY    Culture   Final    NO GROWTH 1 DAY Performed at [facility]    Report Status PENDING  Incomplete  Urine culture     Status: None (Preliminary result)   Collection Time: 11/07/15  4:35 PM  Result Value Ref Range Status   Specimen Description URINE, CLEAN CATCH  Final   Special Requests NONE  Final   Culture   Final    TOO YOUNG TO READ Performed at [facility]    Report Status PENDING  Incomplete  Culture, sputum-assessment     Status: None   Collection Time: 11/07/15  8:42 PM  Result Value Ref Range Status   Specimen Description SPUTUM  Final   Special Requests NONE  Final   Sputum evaluation   Final    THIS  SPECIMEN IS ACCEPTABLE. RESPIRATORY CULTURE REPORT TO FOLLOW.   Report Status 11/07/2015 FINAL  Final     Studies: Dg Chest 2 View  11/07/2015  CLINICAL DATA:  Productive cough with green sputum, shortness of breath and fever for 5 days. EXAM: CHEST  2 VIEW COMPARISON:  08/02/2014 FINDINGS: Stable appearance of the right dual chamber cardiac pacemaker. Heart size is within normal limits. Aortic arch is calcified. Lungs are clear without airspace disease or pulmonary edema. No acute bone abnormality. No pleural effusions. IMPRESSION: No active cardiopulmonary disease. Electronically Signed   By: Markus Daft M.D.   On: 11/07/2015 13:13   Dg Knee 1-2 Views Right  11/08/2015  CLINICAL DATA:  Pain for 2 days. No recent trauma. Total knee replacement 3 years ago. EXAM: RIGHT KNEE - 1-2 VIEW COMPARISON:  None. FINDINGS: The patient is status post knee replacement. Hardware is in good position with no loosening  or failure identified. Surgical screws and metallic tacks project over the proximal tibia, also in good position. No joint effusion. Mild anterior soft tissue swelling is seen. No bony erosion or bony lesions. No fractures are seen. IMPRESSION: Mild anterior soft tissue swelling.  No other abnormalities. Electronically Signed   By: Dorise Bullion III M.D   On: 11/08/2015 16:00   Ct Chest Wo Contrast  11/07/2015  CLINICAL DATA:  Lethargy, fever, URI symptoms, increased coughing, shortness of breath,, weakness, fever and chills today, hypoxemia with 84% oxygen saturation on room air, history coronary artery disease, CHF, paroxysmal atrial fibrillation, hypertension, hyperlipidemia EXAM: CT CHEST WITHOUT CONTRAST TECHNIQUE: Multidetector CT imaging of the chest was performed following the standard protocol without IV contrast. Sagittal and coronal MPR images reconstructed from axial data set. COMPARISON:  08/03/2014 FINDINGS: Beam hardening artifacts from pacemaker generator upper RIGHT chest. Extensive  atherosclerotic calcifications aorta, proximal great vessels, and coronary arteries. Aneurysmal dilatation ascending thoracic aorta 4.1 cm transverse image 25. Minimally enlarged precarinal lymph node 11 mm short axis image 21. Additional scattered normal size thoracic nodes. Gallbladder surgically absent. Visualized upper abdomen otherwise unremarkable. Central peribronchial thickening. Dependent atelectasis in the posterior lungs bilaterally. Tiny LEFT pleural effusion. Calcified granuloma RIGHT upper lobe image 22. No acute infiltrate, pleural effusion, or pneumothorax. Bones demineralized with scattered degenerative disc disease changes thoracic spine. IMPRESSION: Bronchitic changes with bibasilar atelectasis. Single nonspecific minimally enlarged precarinal lymph node. Extensive atherosclerotic calcification with aneurysmal dilatation of the ascending thoracic aorta; recommendation below. Recommend annual imaging followup by CTA or MRA. This recommendation follows 2010 ACCF/AHA/AATS/ACR/ASA/SCA/SCAI/SIR/STS/SVM Guidelines for the Diagnosis and Management of Patients with Thoracic Aortic Disease. Circulation. 2010; [id number] Electronically Signed   By: Lavonia Dana M.D.   On: 11/07/2015 19:41    Scheduled Meds: . atenolol  100 mg Oral BID  . azithromycin  500 mg Intravenous Q24H  . famotidine  20 mg Oral Daily  . guaiFENesin  600 mg Oral BID  . levalbuterol  0.63 mg Nebulization TID  . levothyroxine  100 mcg Oral QAC breakfast  . piperacillin-tazobactam  3.375 g Intravenous Q8H  . potassium chloride SA  20 mEq Oral BID  . pregabalin  75 mg Oral BID  . sertraline  50 mg Oral Daily  . triamcinolone cream   Topical TID  . [START ON 11/09/2015] vancomycin  500 mg Intravenous Q12H  . vancomycin  1,000 mg Intravenous Once  . warfarin  2 mg Oral Once per day on Sun Tue Thu Sat  . warfarin  3 mg Oral Q M,W,F-1800  . Warfarin - Physician Dosing Inpatient   Does not apply q1800   Continuous  Infusions: . sodium chloride 75 mL/hr at 11/08/15 0624     Time spent: 25 minutes    Clyde Upshaw  [facility] Pager [phone]. If 7PM-7AM, please contact night-coverage at [email or web address], password [place] 11/08/2015, 4:49 PM  LOS: 1 day

## 2015-11-08 NOTE — Progress Notes (Signed)
Pharmacy Antibiotic Note  [name] is a 82 y.o. female admitted on 11/07/2015 with sepsis and was initially started on ceftriaxone and azithromycin for CAP.  On 3/4, MD adjusting antibiotics for possible aspiration pneumonia with Zosyn and Pharmacy has been consulted for vancomycin dosing for new concern of osteomyelitis of the R knee.  Plan: Vancomycin 1g IV x 1, then 750mg  IV every 12 hours.  Goal trough 15-20 mcg/mL.  Follow up renal function & cultures De-escalate as appropriate  Height: 5\' 1"  (154.9 cm) Weight: 151 lb 10.8 oz (68.8 kg) IBW/kg (Calculated) : 47.8  Temp (24hrs), Avg:98.6 F (37 C), Min:97.9 F (36.6 C), Max:99.1 F (37.3 C)   Recent Labs Lab 11/07/15 1228 11/07/15 1249 11/07/15 1629 11/07/15 2208 11/08/15 0554  WBC 15.0*  --   --   --  15.0*  CREATININE 1.09*  --   --   --  0.87  LATICACIDVEN  --  2.13* 2.26* 1.8  --     Estimated Creatinine Clearance: 46.5 mL/min (by C-G formula based on Cr of 0.87).    Allergies  Allergen Reactions  . Amiodarone      Thyroid and liver and kidney problems  . Codeine Nausea And Vomiting  . Gabapentin Itching  . Hydrocodone Other (See Comments)    hallucination  . Morphine And Related Nausea And Vomiting  . Requip [Ropinirole Hcl]     Headache   . Zinc     nausea    Antimicrobials this admission: 3/3 >> Rocephin >> 3/4 3/3 >> Azithromycin >> 3/4 >> Zosyn (MD) >> 3/4 >> Vancomycin >>  Dose adjustments this admission: ---  3/3 BCx: 1/2 GPC in chains 3/3 UCx: sent 3/3 Sputum: sent, acceptable 3/3 Influenza panel: neg 3/3 urine strep/legionella: neg/IP  Thank you for allowing pharmacy to be a part of this patient's care.  Peggyann Juba, PharmD, BCPS Pager: [phone] 11/08/2015 3:38 PM

## 2015-11-09 DIAGNOSIS — E538 Deficiency of other specified B group vitamins: Secondary | ICD-10-CM | POA: Diagnosis present

## 2015-11-09 DIAGNOSIS — R195 Other fecal abnormalities: Secondary | ICD-10-CM | POA: Diagnosis present

## 2015-11-09 DIAGNOSIS — R7881 Bacteremia: Secondary | ICD-10-CM | POA: Diagnosis present

## 2015-11-09 LAB — VITAMIN B12: Vitamin B-12: 173 pg/mL — ABNORMAL LOW (ref 180–914)

## 2015-11-09 LAB — IRON AND TIBC
IRON: 14 ug/dL — AB (ref 28–170)
Saturation Ratios: 4 % — ABNORMAL LOW (ref 10.4–31.8)
TIBC: 357 ug/dL (ref 250–450)
UIBC: 343 ug/dL

## 2015-11-09 LAB — RETICULOCYTES
RBC.: 2.95 MIL/uL — ABNORMAL LOW (ref 3.87–5.11)
RETIC COUNT ABSOLUTE: 53.1 10*3/uL (ref 19.0–186.0)
Retic Ct Pct: 1.8 % (ref 0.4–3.1)

## 2015-11-09 LAB — COMPREHENSIVE METABOLIC PANEL
ALT: 11 U/L — ABNORMAL LOW (ref 14–54)
ANION GAP: 7 (ref 5–15)
AST: 18 U/L (ref 15–41)
Albumin: 2.4 g/dL — ABNORMAL LOW (ref 3.5–5.0)
Alkaline Phosphatase: 72 U/L (ref 38–126)
BILIRUBIN TOTAL: 0.7 mg/dL (ref 0.3–1.2)
BUN: 9 mg/dL (ref 6–20)
CALCIUM: 8.3 mg/dL — AB (ref 8.9–10.3)
CO2: 17 mmol/L — AB (ref 22–32)
CREATININE: 0.84 mg/dL (ref 0.44–1.00)
Chloride: 116 mmol/L — ABNORMAL HIGH (ref 101–111)
GLUCOSE: 95 mg/dL (ref 65–99)
POTASSIUM: 3.9 mmol/L (ref 3.5–5.1)
Sodium: 140 mmol/L (ref 135–145)
Total Protein: 5.4 g/dL — ABNORMAL LOW (ref 6.5–8.1)

## 2015-11-09 LAB — CBC WITH DIFFERENTIAL/PLATELET
BASOS ABS: 0 10*3/uL (ref 0.0–0.1)
Basophils Relative: 0 %
EOS PCT: 1 %
Eosinophils Absolute: 0.1 10*3/uL (ref 0.0–0.7)
HEMATOCRIT: 25.6 % — AB (ref 36.0–46.0)
Hemoglobin: 7.9 g/dL — ABNORMAL LOW (ref 12.0–15.0)
LYMPHS PCT: 12 %
Lymphs Abs: 1.6 10*3/uL (ref 0.7–4.0)
MCH: 26.8 pg (ref 26.0–34.0)
MCHC: 30.9 g/dL (ref 30.0–36.0)
MCV: 86.8 fL (ref 78.0–100.0)
MONO ABS: 1.3 10*3/uL — AB (ref 0.1–1.0)
MONOS PCT: 10 %
NEUTROS ABS: 9.8 10*3/uL — AB (ref 1.7–7.7)
Neutrophils Relative %: 77 %
PLATELETS: 179 10*3/uL (ref 150–400)
RBC: 2.95 MIL/uL — ABNORMAL LOW (ref 3.87–5.11)
RDW: 15.6 % — AB (ref 11.5–15.5)
WBC: 12.8 10*3/uL — ABNORMAL HIGH (ref 4.0–10.5)

## 2015-11-09 LAB — URINE CULTURE

## 2015-11-09 LAB — PROTIME-INR
INR: 2.52 — AB (ref 0.00–1.49)
Prothrombin Time: 26.9 seconds — ABNORMAL HIGH (ref 11.6–15.2)

## 2015-11-09 LAB — C-REACTIVE PROTEIN: CRP: 21.9 mg/dL — ABNORMAL HIGH (ref <1.0)

## 2015-11-09 LAB — FOLATE: FOLATE: 20.3 ng/mL (ref >5.9)

## 2015-11-09 LAB — FERRITIN: Ferritin: 48 ng/mL (ref 11–307)

## 2015-11-09 MED ORDER — WARFARIN SODIUM 2 MG PO TABS
2.0000 mg | ORAL_TABLET | Freq: Once | ORAL | Status: DC
  Filled 2015-11-09: qty 1

## 2015-11-09 MED ORDER — DARBEPOETIN ALFA 25 MCG/0.42ML IJ SOSY
25.0000 ug | PREFILLED_SYRINGE | INTRAMUSCULAR | Status: DC
  Administered 2015-11-09: 25 ug via SUBCUTANEOUS
  Filled 2015-11-09: qty 0.42

## 2015-11-09 MED ORDER — PHYTONADIONE 5 MG PO TABS
10.0000 mg | ORAL_TABLET | Freq: Once | ORAL | Status: AC
  Administered 2015-11-09: 10 mg via ORAL
  Filled 2015-11-09: qty 2

## 2015-11-09 MED ORDER — PANTOPRAZOLE SODIUM 40 MG IV SOLR
40.0000 mg | Freq: Two times a day (BID) | INTRAVENOUS | Status: DC
  Administered 2015-11-09 – 2015-11-15 (×13): 40 mg via INTRAVENOUS
  Filled 2015-11-09 (×13): qty 40

## 2015-11-09 MED ORDER — VITAMIN B-12 1000 MCG PO TABS
1000.0000 ug | ORAL_TABLET | Freq: Every day | ORAL | Status: DC
  Administered 2015-11-09 – 2015-11-13 (×4): 1000 ug via ORAL
  Filled 2015-11-09 (×5): qty 1

## 2015-11-09 MED ORDER — FERROUS SULFATE 325 (65 FE) MG PO TABS
325.0000 mg | ORAL_TABLET | Freq: Three times a day (TID) | ORAL | Status: DC
  Administered 2015-11-09 – 2015-11-10 (×4): 325 mg via ORAL
  Filled 2015-11-09 (×7): qty 1

## 2015-11-09 MED ORDER — WARFARIN - PHARMACIST DOSING INPATIENT: Freq: Every day | Status: DC

## 2015-11-09 NOTE — Progress Notes (Addendum)
[facility] PROGRESS NOTE  ARLESHA SPELMAN [id number] DOB: 1933-09-24 DOA: 11/07/2015 PCP: [name], MD  Summary 11/08/15: I have seen and examined [name] at bedside in the presence of her daughter and reviewed her chart. Adely Miano is the pleasant 82 yo female with multiple medical issues including PAF on chronic anticoagulation, essential htn, Hypothyroidism, history of right total knee replacement(?with superinfection needing "cleaning up"), who presented to [facility] ED with progressively worsening dyspnea associated with subjective fevers, chills, cough productive of yellow sputum, chest tightness that occurs with coughing spells, malaise, poor oral intake, wheezing, difficulty with ambulation due to dyspnea. She was found to be septic with suggestion of pneumonia, and to have afib with rvr. CT chest showed "Bronchitic changes with bibasilar atelectasis. Single nonspecific minimally enlarged precarinal lymph node..." Her white count continues to increase despite Ceftriaxone/Zithromax. She still has fever. She also complains of right knee pain and this raises concern for superinfection. An XRay for the right knee shows "Mild anterior soft tissue swelling. No other abnormalities". Blood culture results pending. Will broaden antibiotics to Vancomycin/Zosyn Zithromax pending septic workup since she is to have fever. Will also check sedimentation rate/CRP. Will obtain CT of the knee If sed rate/crp elevated. Will resume atenolol for rate control, and continue Coumadin per pharmacy targets INR 2-3. 11/09/15: Dropped hemoglobin further to 7.9 g/dl, stool Hemoccult positive, Iron level low at 14, vitamin B12 borderline low. INR therapeutic. CRP 21. White count improved to 12,800. Sputum culture shows oral flora. Gram-positive cocci in one bottle(?contaminant). Patient Jehovah's Witness, and declines blood products. The challenge is that she cannot get blood transfusion if need be. We'll  therefore minimize the risk of further blood loss by discontinuing anticoagulation & giving vitamin K for now, give PPI, add iron supplements, Aranesp, consult GI(spoke with Dr Henrene Pastor who will arrange for patient to be seen in consult in am-Appreciate help), repeat blood culture, follow septic work up and continue current antibiotics. As far as anemia is concerned, ideal solution would be to identify and address source of bleeding then resume anticoagulation. Patient at risk for embolic CVA without anticoagulation. Clinical situation complicated by religious limitation to receiving blood products. Otherwise patient feels somewhat better with less right leg pain. Plan Gram-positive Sepsis (HCC)/Pneumonia  Follow septic workup  Continue Vancomycin/Zosyn/Zithromax pending ID and sensitivity of gram-positive cocci Right knee pain/S/P right TKA  Sedimentation rate/CRP  CT of the right knee if sedimentation rate/CRP elevated, and possibly orthopedics consultation also. Atrial fibrillation (HCC)/Cardiac pacemaker (06/2010)/Essential hypertension/Long term current use of anticoagulant therapy  Continue atenolol  D/c Coumadin in view of gi loss- give dose of vitamin K instead. Hypothyroidism  TSH normal  Continue Synthroid Acute kidney injury ([employer])   Renal function better  Monitor Iron deficiency anemia/vitamin B12 deficiency/stool Hemoccult positive anemia  Anemia panel suggests iron deficiency/vitamin B12 deficiency  Stool hemeoccult positive  Iron supplements/vitamin B12 supplements, Jehovah's Witness, declines blood products.   Aranesp Code Status: Full Code Family Communication: Patient, sister and daughter at the bedside Disposition Plan: Home eventually   Consultants:  GI  Procedures:    Antibiotics:  Ceftriaxone 11/07/15>>11/08/15  Vancomycin 11/08/15>>  Zosyn 11/08/15>>  Zithromax 11/08/15>>  HPI/Subjective: No complaints. Feels better.  Objective: Filed Vitals:    11/09/15 0409 11/09/15 1424  BP: 110/61 124/66  Pulse: 98 98  Temp: 98.4 F (36.9 C) 99.8 F (37.7 C)  Resp: 20 20    Intake/Output Summary (Last 24 hours) at 11/09/15 1526 Last data filed at 11/09/15  0625  Gross per 24 hour  Intake 2506.25 ml  Output    200 ml  Net 2306.25 ml   Filed Weights   11/07/15 1700  Weight: 68.8 kg (151 lb 10.8 oz)    Exam:   General:  Comfortable at rest.  Cardiovascular: S1-S2 normal. No murmurs. Pulse regular.  Respiratory: Good air entry bilaterally. No rhonchi or rales.  Abdomen: Soft and nontender. Normal bowel sounds. No organomegaly.  Musculoskeletal: No pedal edema   Neurological: Intact  Data Reviewed: Basic Metabolic Panel:  Recent Labs Lab 11/07/15 1228 11/07/15 1740 11/08/15 0554 11/09/15 0605  NA 139  --  143 140  K 3.8  --  3.6 3.9  CL 114*  --  117* 116*  CO2 18*  --  19* 17*  GLUCOSE 140*  --  103* 95  BUN 19  --  13 9  CREATININE 1.09*  --  0.87 0.84  CALCIUM 8.1*  --  8.2* 8.3*  MG  --  1.5*  --   --    Liver Function Tests:  Recent Labs Lab 11/07/15 1228 11/09/15 0605  AST 18 18  ALT 12* 11*  ALKPHOS 107 72  BILITOT 0.4 0.7  PROT 6.3* 5.4*  ALBUMIN 3.3* 2.4*   No results for input(s): LIPASE, AMYLASE in the last 168 hours. No results for input(s): AMMONIA in the last 168 hours. CBC:  Recent Labs Lab 11/07/15 1228 11/08/15 0554 11/09/15 0605  WBC 15.0* 15.0* 12.8*  NEUTROABS 13.7*  --  9.8*  HGB 10.2* 8.2* 7.9*  HCT 32.7* 26.0* 25.6*  MCV 86.7 86.7 86.8  PLT 238 196 179   Cardiac Enzymes: No results for input(s): CKTOTAL, CKMB, CKMBINDEX, TROPONINI in the last 168 hours. BNP (last 3 results) No results for input(s): BNP in the last 8760 hours.  ProBNP (last 3 results) No results for input(s): PROBNP in the last 8760 hours.  CBG: No results for input(s): GLUCAP in the last 168 hours.  Recent Results (from the past 240 hour(s))  Culture, blood (routine x 2)     Status: None  (Preliminary result)   Collection Time: 11/07/15 12:28 PM  Result Value Ref Range Status   Specimen Description BLOOD RIGHT ANTECUBITAL  Final   Special Requests IN PEDIATRIC BOTTLE 4ML  Final   Culture   Final    NO GROWTH 2 DAYS Performed at [facility]    Report Status PENDING  Incomplete  Culture, blood (routine x 2)     Status: None (Preliminary result)   Collection Time: 11/07/15 12:29 PM  Result Value Ref Range Status   Specimen Description BLOOD LEFT HAND  Final   Special Requests IN PEDIATRIC BOTTLE 2ML  Final   Culture  Setup Time   Final    GRAM POSITIVE COCCI IN CHAINS IN PEDIATRIC BOTTLE CRITICAL RESULT CALLED TO, READ BACK BY AND VERIFIED WITH: C ADDISON,RN @0449  11/08/15 MKELLY    Culture   Final    GRAM POSITIVE COCCI Performed at [facility]    Report Status PENDING  Incomplete  Urine culture     Status: None   Collection Time: 11/07/15  4:35 PM  Result Value Ref Range Status   Specimen Description URINE, CLEAN CATCH  Final   Special Requests NONE  Final   Culture   Final    MULTIPLE SPECIES PRESENT, SUGGEST RECOLLECTION Performed at [facility]    Report Status 11/09/2015 FINAL  Final  Culture, sputum-assessment  Status: None   Collection Time: 11/07/15  8:42 PM  Result Value Ref Range Status   Specimen Description SPUTUM  Final   Special Requests NONE  Final   Sputum evaluation   Final    THIS SPECIMEN IS ACCEPTABLE. RESPIRATORY CULTURE REPORT TO FOLLOW.   Report Status 11/07/2015 FINAL  Final  Culture, respiratory (NON-Expectorated)     Status: None (Preliminary result)   Collection Time: 11/07/15  8:42 PM  Result Value Ref Range Status   Specimen Description SPUTUM  Final   Special Requests NONE  Final   Gram Stain   Final    ABUNDANT WBC PRESENT, PREDOMINANTLY PMN FEW SQUAMOUS EPITHELIAL CELLS PRESENT MODERATE GRAM POSITIVE COCCI IN PAIRS IN CLUSTERS FEW GRAM POSITIVE RODS THIS SPECIMEN IS ACCEPTABLE FOR SPUTUM  CULTURE Performed at [facility]    Culture   Final    NORMAL OROPHARYNGEAL FLORA Performed at [facility]    Report Status PENDING  Incomplete     Studies: Dg Knee 1-2 Views Right  11/08/2015  CLINICAL DATA:  Pain for 2 days. No recent trauma. Total knee replacement 3 years ago. EXAM: RIGHT KNEE - 1-2 VIEW COMPARISON:  None. FINDINGS: The patient is status post knee replacement. Hardware is in good position with no loosening or failure identified. Surgical screws and metallic tacks project over the proximal tibia, also in good position. No joint effusion. Mild anterior soft tissue swelling is seen. No bony erosion or bony lesions. No fractures are seen. IMPRESSION: Mild anterior soft tissue swelling.  No other abnormalities. Electronically Signed   By: Dorise Bullion III M.D   On: 11/08/2015 16:00   Ct Chest Wo Contrast  11/07/2015  CLINICAL DATA:  Lethargy, fever, URI symptoms, increased coughing, shortness of breath,, weakness, fever and chills today, hypoxemia with 84% oxygen saturation on room air, history coronary artery disease, CHF, paroxysmal atrial fibrillation, hypertension, hyperlipidemia EXAM: CT CHEST WITHOUT CONTRAST TECHNIQUE: Multidetector CT imaging of the chest was performed following the standard protocol without IV contrast. Sagittal and coronal MPR images reconstructed from axial data set. COMPARISON:  08/03/2014 FINDINGS: Beam hardening artifacts from pacemaker generator upper RIGHT chest. Extensive atherosclerotic calcifications aorta, proximal great vessels, and coronary arteries. Aneurysmal dilatation ascending thoracic aorta 4.1 cm transverse image 25. Minimally enlarged precarinal lymph node 11 mm short axis image 21. Additional scattered normal size thoracic nodes. Gallbladder surgically absent. Visualized upper abdomen otherwise unremarkable. Central peribronchial thickening. Dependent atelectasis in the posterior lungs bilaterally. Tiny LEFT pleural  effusion. Calcified granuloma RIGHT upper lobe image 22. No acute infiltrate, pleural effusion, or pneumothorax. Bones demineralized with scattered degenerative disc disease changes thoracic spine. IMPRESSION: Bronchitic changes with bibasilar atelectasis. Single nonspecific minimally enlarged precarinal lymph node. Extensive atherosclerotic calcification with aneurysmal dilatation of the ascending thoracic aorta; recommendation below. Recommend annual imaging followup by CTA or MRA. This recommendation follows 2010 ACCF/AHA/AATS/ACR/ASA/SCA/SCAI/SIR/STS/SVM Guidelines for the Diagnosis and Management of Patients with Thoracic Aortic Disease. Circulation. 2010; [id number] Electronically Signed   By: Lavonia Dana M.D.   On: 11/07/2015 19:41    Scheduled Meds: . atenolol  100 mg Oral BID  . azithromycin  500 mg Intravenous Q24H  . ferrous sulfate  325 mg Oral TID WC  . guaiFENesin  600 mg Oral BID  . levalbuterol  0.63 mg Nebulization TID  . levothyroxine  100 mcg Oral QAC breakfast  . pantoprazole (PROTONIX) IV  40 mg Intravenous Q12H  . piperacillin-tazobactam  3.375 g Intravenous Q8H  .  potassium chloride SA  20 mEq Oral BID  . pregabalin  75 mg Oral BID  . sertraline  50 mg Oral Daily  . triamcinolone cream   Topical TID  . vancomycin  500 mg Intravenous Q12H  . vitamin B-12  1,000 mcg Oral Daily   Continuous Infusions: . sodium chloride 75 mL/hr at 11/08/15 2249     Time spent: 25 minutes    Blimie Vaness  [facility] Pager [phone]. If 7PM-7AM, please contact night-coverage at [email or web address], password [facility] 11/09/2015, 3:26 PM  LOS: 2 days

## 2015-11-09 NOTE — Progress Notes (Signed)
[place] for warfarin Indication: atrial fibrillation  Allergies  Allergen Reactions  . Amiodarone      Thyroid and liver and kidney problems  . Codeine Nausea And Vomiting  . Gabapentin Itching  . Hydrocodone Other (See Comments)    hallucination  . Morphine And Related Nausea And Vomiting  . Requip [Ropinirole Hcl]     Headache   . Zinc     nausea   Patient Measurements: Height: 5\' 1"  (154.9 cm) Weight: 151 lb 10.8 oz (68.8 kg) IBW/kg (Calculated) : 47.8  Vital Signs: Temp: 98.4 F (36.9 C) (03/05 0409) Temp Source: Oral (03/05 0409) BP: 110/61 mmHg (03/05 0409) Pulse Rate: 98 (03/05 0409)  Labs:  Recent Labs  11/07/15 1228 11/07/15 1740 11/08/15 0554 11/09/15 0605  HGB 10.2*  --  8.2* 7.9*  HCT 32.7*  --  26.0* 25.6*  PLT 238  --  196 179  APTT  --  44*  --   --   LABPROT  --  25.1* 26.5* 26.9*  INR  --  2.31* 2.48* 2.52*  CREATININE 1.09*  --  0.87 0.84   Estimated Creatinine Clearance: 48.2 mL/min (by C-G formula based on Cr of 0.84).  Medications:  Scheduled:  . atenolol  100 mg Oral BID  . azithromycin  500 mg Intravenous Q24H  . famotidine  20 mg Oral Daily  . guaiFENesin  600 mg Oral BID  . levalbuterol  0.63 mg Nebulization TID  . levothyroxine  100 mcg Oral QAC breakfast  . piperacillin-tazobactam  3.375 g Intravenous Q8H  . potassium chloride SA  20 mEq Oral BID  . pregabalin  75 mg Oral BID  . sertraline  50 mg Oral Daily  . triamcinolone cream   Topical TID  . vancomycin  500 mg Intravenous Q12H  . warfarin  3 mg Oral Q M,W,F-1800  . Warfarin - Physician Dosing Inpatient   Does not apply q1800   Assessment: Patient already known to pharmacy for dosing of antibiotics. Patient on warfarin PTA for hx Afib.. INR therapeutic on admission.  Home regimen of 2mg  daily except 3mg  MWF, admit INR 2.31.  Azithromycin may increase INR  Today, 11/09/2015 INR 2.52, tolerating diet H/H decreased, Plt 179, no  sign of bleed  Goal of Therapy:  INR 2-3   Plan:   Continue home regimen; Warfarin 2mg  today  Daily PT/INR  Monitor CBC  Minda Ditto PharmD Pager [phone] 11/09/2015, 12:42 PM

## 2015-11-09 NOTE — Progress Notes (Signed)
Utilization Review Completed.[name] T3/01/2016  

## 2015-11-10 DIAGNOSIS — N183 Chronic kidney disease, stage 3 unspecified: Secondary | ICD-10-CM | POA: Diagnosis present

## 2015-11-10 DIAGNOSIS — D6489 Other specified anemias: Secondary | ICD-10-CM

## 2015-11-10 DIAGNOSIS — R1013 Epigastric pain: Secondary | ICD-10-CM

## 2015-11-10 LAB — CBC
HEMATOCRIT: 26.8 % — AB (ref 36.0–46.0)
HEMOGLOBIN: 8.3 g/dL — AB (ref 12.0–15.0)
MCH: 26.9 pg (ref 26.0–34.0)
MCHC: 31 g/dL (ref 30.0–36.0)
MCV: 86.7 fL (ref 78.0–100.0)
PLATELETS: 202 10*3/uL (ref 150–400)
RBC: 3.09 MIL/uL — ABNORMAL LOW (ref 3.87–5.11)
RDW: 15.5 % (ref 11.5–15.5)
WBC: 11.3 10*3/uL — ABNORMAL HIGH (ref 4.0–10.5)

## 2015-11-10 LAB — COMPREHENSIVE METABOLIC PANEL
ALK PHOS: 75 U/L (ref 38–126)
ALT: 12 U/L — AB (ref 14–54)
AST: 20 U/L (ref 15–41)
Albumin: 2.5 g/dL — ABNORMAL LOW (ref 3.5–5.0)
Anion gap: 7 (ref 5–15)
BUN: 11 mg/dL (ref 6–20)
CALCIUM: 8.3 mg/dL — AB (ref 8.9–10.3)
CHLORIDE: 112 mmol/L — AB (ref 101–111)
CO2: 17 mmol/L — ABNORMAL LOW (ref 22–32)
CREATININE: 0.96 mg/dL (ref 0.44–1.00)
GFR, EST NON AFRICAN AMERICAN: 55 mL/min — AB (ref >60)
Glucose, Bld: 120 mg/dL — ABNORMAL HIGH (ref 65–99)
Potassium: 3.8 mmol/L (ref 3.5–5.1)
Sodium: 136 mmol/L (ref 135–145)
Total Bilirubin: 0.5 mg/dL (ref 0.3–1.2)
Total Protein: 5.7 g/dL — ABNORMAL LOW (ref 6.5–8.1)

## 2015-11-10 LAB — CULTURE, RESPIRATORY W GRAM STAIN

## 2015-11-10 LAB — CULTURE, RESPIRATORY: CULTURE: NORMAL

## 2015-11-10 LAB — LEGIONELLA ANTIGEN, URINE

## 2015-11-10 LAB — PROTIME-INR
INR: 1.49 (ref 0.00–1.49)
PROTHROMBIN TIME: 18.1 s — AB (ref 11.6–15.2)

## 2015-11-10 MED ORDER — LEVALBUTEROL HCL 0.63 MG/3ML IN NEBU
INHALATION_SOLUTION | RESPIRATORY_TRACT | Status: AC
  Filled 2015-11-10: qty 3

## 2015-11-10 MED ORDER — PEG-KCL-NACL-NASULF-NA ASC-C 100 G PO SOLR
0.5000 | Freq: Once | ORAL | Status: AC
  Administered 2015-11-11: 100 g via ORAL
  Filled 2015-11-10: qty 1

## 2015-11-10 MED ORDER — GUAIFENESIN ER 600 MG PO TB12
1200.0000 mg | ORAL_TABLET | Freq: Two times a day (BID) | ORAL | Status: DC
Start: 2015-11-10 — End: 2015-11-15
  Administered 2015-11-10 – 2015-11-15 (×10): 1200 mg via ORAL
  Filled 2015-11-10 (×9): qty 2

## 2015-11-10 MED ORDER — PEG-KCL-NACL-NASULF-NA ASC-C 100 G PO SOLR
0.5000 | Freq: Once | ORAL | Status: AC
  Administered 2015-11-10: 100 g via ORAL
  Filled 2015-11-10: qty 1

## 2015-11-10 MED ORDER — PEG-KCL-NACL-NASULF-NA ASC-C 100 G PO SOLR: 1.0000 | Freq: Once | ORAL | Status: DC

## 2015-11-10 MED ORDER — LEVALBUTEROL HCL 1.25 MG/0.5ML IN NEBU
1.2500 mg | INHALATION_SOLUTION | Freq: Four times a day (QID) | RESPIRATORY_TRACT | Status: DC | PRN
  Administered 2015-11-10: 1.25 mg via RESPIRATORY_TRACT
  Filled 2015-11-10 (×3): qty 0.5

## 2015-11-10 NOTE — Care Management Important Message (Signed)
Important Message  Patient Details  Name: [name] MRN: [id number] Date of Birth: 11/26/1933   Medicare Important Message Given:  Yes    Camillo Flaming 11/10/2015, 3:13 PMImportant Message  Patient Details  Name: [name] MRN: [id number] Date of Birth: 1933-09-12   Medicare Important Message Given:  Yes    Camillo Flaming 11/10/2015, 3:13 PM

## 2015-11-10 NOTE — Progress Notes (Signed)
[facility] PROGRESS NOTE  [name] MRN:[id number] DOB: 12/02/1933 DOA: 11/07/2015 PCP: MCKEOWN,WILLIAM DAVID, MD  Summary 11/08/15: I have seen and examined [name] at bedside in the presence of her daughter and reviewed her chart. [name] is the pleasant 82 yo female with multiple medical issues including PAF on chronic anticoagulation, essential htn, Hypothyroidism, history of right total knee replacement(?with superinfection needing "cleaning up"), who presented to [facility] ED with progressively worsening dyspnea associated with subjective fevers, chills, cough productive of yellow sputum, chest tightness that occurs with coughing spells, malaise, poor oral intake, wheezing, difficulty with ambulation due to dyspnea. She was found to be septic with suggestion of pneumonia, and to have afib with rvr. CT chest showed "Bronchitic changes with bibasilar atelectasis. Single nonspecific minimally enlarged precarinal lymph node..." Her white count continues to increase despite Ceftriaxone/Zithromax. She still has fever. She also complains of right knee pain and this raises concern for superinfection. An XRay for the right knee shows "Mild anterior soft tissue swelling. No other abnormalities". Blood culture results pending. Will broaden antibiotics to Vancomycin/Zosyn Zithromax pending septic workup since she is to have fever. Will also check sedimentation rate/CRP. Will obtain CT of the knee If sed rate/crp elevated. Will resume atenolol for rate control, and continue Coumadin per pharmacy targets INR 2-3. 11/09/15: Dropped hemoglobin further to 7.9 g/dl, stool Hemoccult positive, Iron level low at 14, vitamin B12 borderline low. INR therapeutic. CRP 21. White count improved to 12,800. Sputum culture shows oral flora. Gram-positive cocci in one bottle(?contaminant). Patient Jehovah's Witness, and declines blood products. The challenge is that she cannot get blood transfusion if need be. We'll  therefore minimize the risk of further blood loss by discontinuing anticoagulation & giving vitamin K for now, give PPI, add iron supplements, Aranesp, consult GI(spoke with Dr Perry who will arrange for patient to be seen in consult in am-Appreciate help), repeat blood culture, follow septic work up and continue current antibiotics. As far as anemia is concerned, ideal solution would be to identify and address source of bleeding then resume anticoagulation. Patient at risk for embolic CVA without anticoagulation. Clinical situation complicated by religious limitation to receiving blood products. Otherwise patient feels somewhat better with less right leg pain. 11/10/15: Appreciate GI. Hemoglobin 8.3g/dl, wbc 11.300, INR 1.49. Sedimentation Await ID and sensitivity of gram-positive cocci. Patient coughing. Will continue current antibiotics, follow septic workup and de-escalate antibiotics as necessary, and follow GI recommendations-for EGD/colonoscopy tomorrow. Meanwhile, patient will stay off Coumadin and continue Aranesp/Iron/vitamin b12 supplements. Plan Gram-positive Sepsis (HCC)/Pneumonia  Follow septic workup  Continue Vancomycin/Zosyn/Zithromax pending ID and sensitivity of gram-positive cocci Right knee pain/S/P right TKA  CRP 21.9  Less pain, monitor for now. Atrial fibrillation (HCC)/Cardiac pacemaker (06/2010)/Essential hypertension/Long term current use of anticoagulant therapy  Continue atenolol  Off Coumadin in view of gi loss- ?Resume once GI bleed addressed. Hypothyroidism  TSH normal  Continue Synthroid CKD Stage 3/Acute kidney injury (HCC)   Renal function now normal    Monitor Iron deficiency anemia/vitamin B12 deficiency/stool Hemoccult positive anemia  Continue Iron supplements/vitamin B12 supplements, Jehovah's Witness, declines blood products.   Aranesp  Appreciate Gi  For EGD/Colonoscopy in am Code Status: Full Code Family Communication: Patient, sister  and daughter at the bedside Disposition Plan: Home eventually   Consultants:  GI  Procedures:    Antibiotics:  Ceftriaxone 11/07/15>>11/08/15  Vancomycin 11/08/15>>  Zosyn 11/08/15>>  Zithromax 11/08/15>>  HPI/Subjective: Coughing some. Less pain right knee.  Objective: Filed Vitals:     11/10/15 0614 11/10/15 1353  BP: 118/61 151/74  Pulse: 98 102  Temp: 98.6 F (37 C) 98.2 F (36.8 C)  Resp: 20 20    Intake/Output Summary (Last 24 hours) at 11/10/15 1900 Last data filed at 11/10/15 0757  Gross per 24 hour  Intake 2563.33 ml  Output      0 ml  Net 2563.33 ml   Filed Weights   11/07/15 1700  Weight: 68.8 kg (151 lb 10.8 oz)    Exam:   General:  Comfortable at rest.  Cardiovascular: S1-S2 normal. No murmurs. Pulse regular.  Respiratory: Good air entry bilaterally. No rhonchi or rales.  Abdomen: Soft and nontender. Normal bowel sounds. No organomegaly.  Musculoskeletal: No pedal edema   Neurological: Intact  Data Reviewed: Basic Metabolic Panel:  Recent Labs Lab 11/07/15 1228 11/07/15 1740 11/08/15 0554 11/09/15 0605 11/10/15 0511  NA 139  --  143 140 136  K 3.8  --  3.6 3.9 3.8  CL 114*  --  117* 116* 112*  CO2 18*  --  19* 17* 17*  GLUCOSE 140*  --  103* 95 120*  BUN 19  --  13 9 11  CREATININE 1.09*  --  0.87 0.84 0.96  CALCIUM 8.1*  --  8.2* 8.3* 8.3*  MG  --  1.5*  --   --   --    Liver Function Tests:  Recent Labs Lab 11/07/15 1228 11/09/15 0605 11/10/15 0511  AST 18 18 20  ALT 12* 11* 12*  ALKPHOS 107 72 75  BILITOT 0.4 0.7 0.5  PROT 6.3* 5.4* 5.7*  ALBUMIN 3.3* 2.4* 2.5*   No results for input(s): LIPASE, AMYLASE in the last 168 hours. No results for input(s): AMMONIA in the last 168 hours. CBC:  Recent Labs Lab 11/07/15 1228 11/08/15 0554 11/09/15 0605 11/10/15 0511  WBC 15.0* 15.0* 12.8* 11.3*  NEUTROABS 13.7*  --  9.8*  --   HGB 10.2* 8.2* 7.9* 8.3*  HCT 32.7* 26.0* 25.6* 26.8*  MCV 86.7 86.7 86.8 86.7  PLT  238 196 179 202   Cardiac Enzymes: No results for input(s): CKTOTAL, CKMB, CKMBINDEX, TROPONINI in the last 168 hours. BNP (last 3 results) No results for input(s): BNP in the last 8760 hours.  ProBNP (last 3 results) No results for input(s): PROBNP in the last 8760 hours.  CBG: No results for input(s): GLUCAP in the last 168 hours.  Recent Results (from the past 240 hour(s))  Culture, blood (routine x 2)     Status: None (Preliminary result)   Collection Time: 11/07/15 12:28 PM  Result Value Ref Range Status   Specimen Description BLOOD RIGHT ANTECUBITAL  Final   Special Requests IN PEDIATRIC BOTTLE 4ML  Final   Culture   Final    NO GROWTH 3 DAYS Performed at [facility]    Report Status PENDING  Incomplete  Culture, blood (routine x 2)     Status: None (Preliminary result)   Collection Time: 11/07/15 12:29 PM  Result Value Ref Range Status   Specimen Description BLOOD LEFT HAND  Final   Special Requests IN PEDIATRIC BOTTLE 2ML  Final   Culture  Setup Time   Final    GRAM POSITIVE COCCI IN CHAINS IN PEDIATRIC BOTTLE CRITICAL RESULT CALLED TO, READ BACK BY AND VERIFIED WITH: C ADDISON,RN @0449 11/08/15 MKELLY    Culture   Final    GRAM POSITIVE COCCI IDENTIFICATION TO FOLLOW Performed at [facility]      Report Status PENDING  Incomplete  Urine culture     Status: None   Collection Time: 11/07/15  4:35 PM  Result Value Ref Range Status   Specimen Description URINE, CLEAN CATCH  Final   Special Requests NONE  Final   Culture   Final    MULTIPLE SPECIES PRESENT, SUGGEST RECOLLECTION Performed at [facility]    Report Status 11/09/2015 FINAL  Final  Culture, sputum-assessment     Status: None   Collection Time: 11/07/15  8:42 PM  Result Value Ref Range Status   Specimen Description SPUTUM  Final   Special Requests NONE  Final   Sputum evaluation   Final    THIS SPECIMEN IS ACCEPTABLE. RESPIRATORY CULTURE REPORT TO FOLLOW.   Report Status  11/07/2015 FINAL  Final  Culture, respiratory (NON-Expectorated)     Status: None   Collection Time: 11/07/15  8:42 PM  Result Value Ref Range Status   Specimen Description SPUTUM  Final   Special Requests NONE  Final   Gram Stain   Final    ABUNDANT WBC PRESENT, PREDOMINANTLY PMN FEW SQUAMOUS EPITHELIAL CELLS PRESENT MODERATE GRAM POSITIVE COCCI IN PAIRS IN CLUSTERS FEW GRAM POSITIVE RODS THIS SPECIMEN IS ACCEPTABLE FOR SPUTUM CULTURE Performed at [facility]    Culture   Final    NORMAL OROPHARYNGEAL FLORA Performed at [facility]    Report Status 11/10/2015 FINAL  Final     Studies: No results found.  Scheduled Meds: . atenolol  100 mg Oral BID  . azithromycin  500 mg Intravenous Q24H  . Darbepoetin Alfa  25 mcg Subcutaneous Q7 days  . ferrous sulfate  325 mg Oral TID WC  . guaiFENesin  1,200 mg Oral BID  . levalbuterol  0.63 mg Nebulization TID  . levothyroxine  100 mcg Oral QAC breakfast  . pantoprazole (PROTONIX) IV  40 mg Intravenous Q12H  . [START ON 11/11/2015] peg 3350 powder  0.5 kit Oral Once  . piperacillin-tazobactam  3.375 g Intravenous Q8H  . potassium chloride SA  20 mEq Oral BID  . pregabalin  75 mg Oral BID  . sertraline  50 mg Oral Daily  . triamcinolone cream   Topical TID  . vancomycin  500 mg Intravenous Q12H  . vitamin B-12  1,000 mcg Oral Daily   Continuous Infusions: . sodium chloride 50 mL/hr at 11/09/15 1611     Time spent: 25 minutes    RANGA,SIMBISO  [facility] Pager [phone]. If 7PM-7AM, please contact night-coverage at [email or web address], password [email or web address] 11/10/2015, 7:00 PM  LOS: 3 days   

## 2015-11-10 NOTE — Clinical Documentation Improvement (Signed)
Internal Medicine  Can the diagnosis of CKD be further specified? Please document response in next progress note NOT in BPA drop down box. Thank you!   CKD Stage I - GFR greater than or equal to 90  CKD Stage II - GFR 60-89  CKD Stage III - GFR 30-59  CKD Stage IV - GFR 15-29  CKD Stage V - GFR < 15  ESRD (End Stage Renal Disease)  Other condition  Unable to clinically determine  Supporting Information: : (risk factors, signs and symptoms, diagnostics, treatment)  White female  GFR's for current admission running from 47 to 60  Please exercise your independent, professional judgment when responding. A specific answer is not anticipated or expected.  Thank You, Zoila Shutter RN, BSN, [place] [phone]; Cell: [phone]

## 2015-11-10 NOTE — Progress Notes (Signed)
Physical Therapy Treatment Patient Details Name: [name] MRN: [id number] DOB: 08/09/1934 Today's Date: 11/10/2015    History of Present Illness 82 yo female admitted with sepsis. Hx of PAF, CAD, tachy-brady syndrome, aortic stenosis, neuropathy, RLS, uterine cancer, osteomyelitis, syncope, CKD, TKA, R patella tendon repair 07/2014    PT Comments    Progressing with mobility. O2 sats 99% on RA at rest, 91% on RA during ambulation, dyspnea 3/4. HR fluctuating during session 80s-120s bpm. Pt reports much less R leg/knee pain on today. Will continue to follow and progress activity as able.   Follow Up Recommendations  Home health PT;Supervision/Assistance - 24 hour     Equipment Recommendations  None recommended by PT    Recommendations for Other Services       Precautions / Restrictions Precautions Precautions: Fall Precaution Comments: monitor sats, HR Restrictions Weight Bearing Restrictions: No    Mobility  Bed Mobility Overal bed mobility: Needs Assistance Bed Mobility: Supine to Sit;Sit to Supine     Supine to sit: Min guard Sit to supine: Min guard   General bed mobility comments: Effortful for pt. Increased time.   Transfers Overall transfer level: Needs assistance Equipment used: Rolling walker (2 wheeled) Transfers: Sit to/from Stand Sit to Stand: Min guard         General transfer comment: close guard for safety.   Ambulation/Gait Ambulation/Gait assistance: Min guard Ambulation Distance (Feet): 75 Feet Assistive device: Rolling walker (2 wheeled) Gait Pattern/deviations: Step-through pattern;Decreased stride length;Trunk flexed     General Gait Details: close guard for safety. Pt fatigues fairly quickly/easily.    Stairs            Wheelchair Mobility    Modified Rankin (Stroke Patients Only)       Balance           Standing balance support: Bilateral upper extremity supported;During functional activity Standing  balance-Leahy Scale: Poor                      Cognition Arousal/Alertness: Awake/alert Behavior During Therapy: WFL for tasks assessed/performed Overall Cognitive Status: Within Functional Limits for tasks assessed                      Exercises      General Comments        Pertinent Vitals/Pain Pain Assessment: Faces Faces Pain Scale: Hurts a little bit Pain Location: R LE/knee Pain Descriptors / Indicators: Aching;Sore Pain Intervention(s): Monitored during session;Repositioned    Home Living                      Prior Function            PT Goals (current goals can now be found in the care plan section) Progress towards PT goals: Progressing toward goals    Frequency  Min 3X/week    PT Plan Current plan remains appropriate    Co-evaluation             End of Session Equipment Utilized During Treatment: Gait belt Activity Tolerance: Patient limited by fatigue (Limited by dyspnea) Patient left: with call bell/phone within reach;with family/visitor present     Time: 1209-1225 PT Time Calculation (min) (ACUTE ONLY): 16 min  Charges:  $Gait Training: 8-22 mins                    G Codes:      Weston Anna, MPT Pager:  [phone]   

## 2015-11-10 NOTE — Clinical Documentation Improvement (Signed)
Internal Medicine  Can the diagnosis of CHF be further specified? Please document response in next progress note NOT in BPA drop down box. Thank you!    Acuity - Acute, Chronic, Acute on Chronic   Type - Systolic, Diastolic, Systolic and Diastolic  Other  Clinically Undetermined  Document any associated diagnoses/conditions  Supporting Information:  ECHO done 2/17 reveals EF of 60-65% with normal systolic function, moderate LVH  Being treated with Atenolol 100mg  twice daily  Please exercise your independent, professional judgment when responding. A specific answer is not anticipated or expected.  Thank You,  Zoila Shutter RN, BSN, [place] [phone]; Cell: [phone]

## 2015-11-10 NOTE — Consult Note (Signed)
Referring Provider: [facility] Primary Care Physician:  [name], MD Primary Gastroenterologist:  Dr. Deatra Ina  Reason for Consultation:  Anemia, heme positive stools    HPI: [name] is a 82 y.o. female who has been followed by Dr. Erskine Emery in the past. She has a somewhat complicated medical history including a history of tachycardia bradycardia syndrome. She has had a permanent pacemaker implanted in October 2011. She has a history of her repair of patent ductus arteriosus in the past, as well as a history of moderate valvular aortic stenosis. She has history of CHF. Her last echo in December 2013 showed ejection fraction of 60-65% with grade 2 diastolic dysfunction. Her most recent echo in September 2015 showed ejection fraction of 55-60%. The aortic valve showed mild stenosis and moderate regurgitation. 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 Carotid Dopplers did not show any significant obstruction. She has had 3 surgeries on her right leg. She has been in and out of atrial fibrillation and has had significant dyspnea on exertion. She has a history of intolerance to amiodarone because of hepatic, renal, and thyroid issues. She has been on chronic anticoagulation for her PAF.  She presented to the emergency room on March 3 with worsening dyspnea of 3-4 days duration.She was experiencing a productive cough, chest tightness, malaise, wheezing, chills, and fevers at home. Chest x-ray in the ER had no clear signs of infectious etiology. She was empirically started on Zithromax and Rocephin for pneumonia. She was also found to be in A. fib with RVR. Chest complained of right knee pain and films of the right knee revealed mild anterior soft tissue swelling. At this time her antibiotics were advanced to vancomycin, Zosyn, and Zithromax. Hemoglobin in the emergency room was noted to  be 10.2 with an MCV of 86.7. On March 5 her hemoglobin had drifted to 7.9 and MCV was noted to be 86.8. Iron studies showed an iron of 14 with saturation of 4%. Folate was 20.3, vitamin B-12 was 173. Patient is a Sales promotion account executive [employer] and therefore declines blood products. Anticoagulation has been placed on hold.  She states that for the past 8-10 months she has been experiencing epigastric burning that radiates to the umbilical area. She states the burning is worse on an empty stomach and she keeps crackers with her at all times so she has something to snack on to diminish the pain. She denies heartburn, nausea, vomiting, or dysphagia. She had radiation years ago after a radical hysterectomy for uterine cancer, and states she also had implants put in. Since that time, she frequently has diarrhea. She reports that she will have days of 3-4 watery bowel movements a day, but more recently has been having a soft formed bowel movement each morning and shortly thereafter several watery bowel movements. She states this is not a change for her and that this has been going on since she had her radiation. She feels it may have gotten worse after her cholecystectomy. She has had several colonoscopies in the past, her last of which was in 2012. At that time she was noted to have diverticulosis. She has a strong family history of colon cancer in that both parents had colon cancer and a sister had colon cancer. She was advised to have surveillance in 5 years. She has had upper endoscopies in the past and has had an adenomatous gastric  polyp removed. Her last EGD was in May 2010 and it was normal.   Past Medical History  Diagnosis Date  . PAF (paroxysmal atrial fibrillation) ([place])     controlled with amiodarone, on coumadin  . CAD (coronary artery disease)   . Aortic root dilatation ([place])   . Moderate aortic stenosis   . Chronic anticoagulation   . Tachycardia-bradycardia syndrome ([place])     s/p PPM by Dr Doreatha Lew (MDT)  07/03/10  . GERD (gastroesophageal reflux disease)   . Pacemaker     2012  . Neuropathy ([place])     feet/legs  . Restless leg syndrome   . Headache(784.0)     hx migraines - takes Zoloft for migraines  . History of uterine cancer 1980s    treated with hysterectomy, external radiation and radiation seed implants.   . Cancer ([place])     hx Kidney cancer / hx of endometrial cancer  . PONV (postoperative nausea and vomiting)   . Anxiety   . Heart murmur   . HTN (hypertension)   . Hyperlipemia   . Hypothyroidism   . Osteomyelitis ([place]) as child  . Family history of anesthesia complication     daughter has ponv  . Syncope 05/28/2014  . Hepatic steatosis   . Elevated LFTs   . Gastroparesis   . Anal fissure   . Radiation proctitis   . Gastric polyp     adenomatous  . Neuromuscular disorder ([facility])     neuropathy in right leg  . Chronic kidney disease     only has one kidney   . 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 Cardiac catheterization  2008  . Nephrectomy Left     renal cell cancer  . Total abdominal hysterectomy    . Patent ductus arterious repair  age 76  . Ptvp    . Pacemaker insertion  07/04/11    by Dr Doreatha Lew  . Cholecystectomy  1970s  . Upper gastrointestinal endoscopy    . Total knee arthroplasty  02/07/2012    Procedure: TOTAL KNEE ARTHROPLASTY;  Surgeon: Mauri Pole, MD;  Location: [facility];  Service: Orthopedics;  Laterality: Right;  . Insert / replace / remove pacemaker    . Total hip revision Right 2009  . I&d knee with poly exchange Right 12/17/2013    Procedure: IRRIGATION AND DEBRIDEMEN RIGHT TOTAL  KNEE WITH POLY EXCHANGE;  Surgeon: Mauri Pole, MD;  Location: [facility];  Service: Orthopedics;  Laterality: Right;  . Patellar tendon repair Right 03/06/2014    Procedure: AVULSION WITH PATELLA TENDON REPAIR;  Surgeon: Mauri Pole, MD;  Location: [facility];  Service: Orthopedics;  Laterality: Right;  . Total knee  revision Right 07/29/2014    Procedure: RIGHT KNEE REVISION OF PREVIOUS REPAIR EXTENSOR MECHANISM;  Surgeon: Mauri Pole, MD;  Location: [facility];  Service: Orthopedics;  Laterality: Right;    Prior to Admission medications   Medication Sig Start Date End Date Taking? Authorizing Provider  ALPRAZolam Duanne Moron) 0.5 MG tablet TAKE 1/2 TO 1 TABLET BY MOUTH 3 TIMES A DAY AS NEEDED FOR ANXIETY 06/04/15 12/02/15 Yes [name], MD  atenolol (TENORMIN) 100 MG tablet Take 1 tablet (100 mg total) by mouth 2 (two) times daily. 05/19/15  Yes Darlin Coco, MD  Biotin 5000 MCG CAPS Take 10,000 mcg by mouth every morning.    Yes Historical Provider, MD  Cholecalciferol (VITAMIN D PO) Take 5,000  Units by mouth every morning.    Yes Historical Provider, MD  furosemide (LASIX) 40 MG tablet Take 20 mg by mouth daily.    Yes Historical Provider, MD  levothyroxine (SYNTHROID, LEVOTHROID) 100 MCG tablet TAKE 1 TABLET BY MOUTH EVERY DAY ON EMPTY STOMACH WITH A FULL GLASS OF WATER 10/25/15  Yes [name], MD  losartan (COZAAR) 100 MG tablet TAKE 1 TABLET (100 MG TOTAL) BY MOUTH DAILY. 06/23/15  Yes Burtis Junes, NP  nitroGLYCERIN (NITROSTAT) 0.4 MG SL tablet Place 1 tablet (0.4 mg total) under the tongue every 5 (five) minutes as needed for chest pain (MAX 3 TABLETS). 05/19/15  Yes Darlin Coco, MD  potassium chloride SA (K-DUR,KLOR-CON) 20 MEQ tablet Take 20 mEq by mouth 2 (two) times daily.   Yes Historical Provider, MD  pregabalin (LYRICA) 75 MG capsule Take 1 capsule (75 mg total) by mouth 2 (two) times daily. 05/26/15  Yes [name], MD  ranitidine (ZANTAC) 300 MG tablet Take 300 mg by mouth daily. Take 1-2 tablets by mouth daily as needed for heartburn or acid reflux   Yes Historical Provider, MD  sertraline (ZOLOFT) 50 MG tablet Take 1 tablet (50 mg total) by mouth daily. For mood 09/23/15  Yes [name], MD  traMADol (ULTRAM) 50 MG tablet Take 50 mg by mouth every 6 (six) hours as needed  (pain). Reported on 10/14/2015   Yes Historical Provider, MD  triamcinolone cream (KENALOG) 0.1 % APPLY TO AFFECTED AREA TWICE A DAY 04/09/15  Yes [name], MD  warfarin (COUMADIN) 2 MG tablet Take as directed Patient taking differently: Take 2-3 mg by mouth daily. Take as directed: Take 3 mg on MWF and Take 2 mg on remaining days. 06/04/15 12/02/15 Yes [name], MD    Current Facility-Administered Medications  Medication Dose Route Frequency Provider Last Rate Last Dose  . 0.9 %  sodium chloride infusion   Intravenous Continuous Simbiso Ranga, MD 50 mL/hr at 11/09/15 1611    . ALPRAZolam Duanne Moron) tablet 0.5 mg  0.5 mg Oral TID PRN Theodis Blaze, MD      . atenolol (TENORMIN) tablet 100 mg  100 mg Oral BID Simbiso Ranga, MD   100 mg at 11/09/15 2226  . azithromycin (ZITHROMAX) 500 mg in dextrose 5 % 250 mL IVPB  500 mg Intravenous Q24H Adrian Saran, RPH   500 mg at 11/09/15 1446  . Darbepoetin Alfa (ARANESP) injection 25 mcg  25 mcg Subcutaneous Q7 days Nat Math, MD   25 mcg at 11/09/15 1953  . ferrous sulfate tablet 325 mg  325 mg Oral TID WC Simbiso Ranga, MD   325 mg at 11/10/15 0828  . guaiFENesin (MUCINEX) 12 hr tablet 600 mg  600 mg Oral BID Theodis Blaze, MD   600 mg at 11/09/15 2226  . guaiFENesin-dextromethorphan (ROBITUSSIN DM) 100-10 MG/5ML syrup 5 mL  5 mL Oral Q4H PRN Theodis Blaze, MD   5 mL at 11/10/15 0456  . levalbuterol (XOPENEX) nebulizer solution 0.63 mg  0.63 mg Nebulization TID Simbiso Ranga, MD   0.63 mg at 11/10/15 0518  . levalbuterol (XOPENEX) nebulizer solution 1.25 mg  1.25 mg Nebulization Q6H PRN Mickel Baas A [place], PA-C      . levothyroxine (SYNTHROID, LEVOTHROID) tablet 100 mcg  100 mcg Oral QAC breakfast Theodis Blaze, MD   100 mcg at 11/09/15 0744  . nitroGLYCERIN (NITROSTAT) SL tablet 0.4 mg  0.4 mg Sublingual Q5 min PRN Theodis Blaze,  MD      . ondansetron (ZOFRAN) tablet 4 mg  4 mg Oral Q6H PRN Theodis Blaze, MD   4 mg at 11/09/15 1611   Or  .  ondansetron (ZOFRAN) injection 4 mg  4 mg Intravenous Q6H PRN Theodis Blaze, MD      . pantoprazole (PROTONIX) injection 40 mg  40 mg Intravenous Q12H Simbiso Ranga, MD   40 mg at 11/09/15 2227  . piperacillin-tazobactam (ZOSYN) IVPB 3.375 g  3.375 g Intravenous Q8H Simbiso Ranga, MD   3.375 g at 11/10/15 0828  . potassium chloride SA (K-DUR,KLOR-CON) CR tablet 20 mEq  20 mEq Oral BID Theodis Blaze, MD   20 mEq at 11/09/15 2226  . pregabalin (LYRICA) capsule 75 mg  75 mg Oral BID Theodis Blaze, MD   75 mg at 11/09/15 2226  . sertraline (ZOLOFT) tablet 50 mg  50 mg Oral Daily Theodis Blaze, MD   50 mg at 11/09/15 0912  . traMADol (ULTRAM) tablet 50 mg  50 mg Oral Q6H PRN Theodis Blaze, MD   50 mg at 11/09/15 2226  . triamcinolone cream (KENALOG) 0.1 %   Topical TID Theodis Blaze, MD      . vancomycin (VANCOCIN) 500 mg in sodium chloride 0.9 % 100 mL IVPB  500 mg Intravenous Q12H Emiliano Dyer, RPH   500 mg at 11/10/15 [id number]  . vitamin B-12 (CYANOCOBALAMIN) tablet 1,000 mcg  1,000 mcg Oral Daily Simbiso Ranga, MD   1,000 mcg at 11/09/15 1446    Allergies as of 11/07/2015 - Review Complete 11/07/2015  Allergen Reaction Noted  . Amiodarone  10/09/2015  . Codeine Nausea And Vomiting   . Gabapentin Itching 10/28/2014  . Hydrocodone Other (See Comments) 03/07/2014  . Morphine and related Nausea And Vomiting 12/13/2013  . Requip [ropinirole hcl]  07/17/2013  . Zinc  04/22/2011    Family History  Problem Relation Age of Onset  . Colon cancer Mother 32  . Heart disease Mother   . Heart disease Father   . Heart attack Father   . Heart disease Maternal Grandmother     Social History   Social History  . Marital Status: Married    Spouse Name: N/A  . Number of Children: N/A  . Years of Education: N/A   Occupational History  . Not on file.   Social History Main Topics  . Smoking status: Never Smoker   . Smokeless tobacco: Never Used  . Alcohol Use: No  . Drug Use: No  . Sexual  Activity: No   Other Topics Concern  . Not on file   Social History Narrative   Lives in [place].  Continues to work.    Review of Systems: Per history of present illness otherwise negative.  Physical Exam: Vital signs in last 24 hours: Temp:  [98.6 F (37 C)-99.8 F (37.7 C)] 98.6 F (37 C) (03/06 [id number]) Pulse Rate:  [98-104] 98 (03/06 0614) Resp:  [18-20] 20 (03/06 0614) BP: (118-130)/(59-66) 118/61 mmHg (03/06 0614) SpO2:  [98 %-99 %] 99 % (03/06 0614) Last BM Date: 11/09/15 General:   Alert,  Well-developed, pale, pleasant and cooperative in NAD Head:  Normocephalic and atraumatic. Eyes:  Sclera clear, no icterus.   Conjunctiva pink. Ears:  Normal auditory acuity. Nose:  No deformity, discharge,  or lesions. Mouth:  No deformity or lesions.   Neck:  Supple; no masses or thyromegaly. Lungs:  Diminished breath sounds  Heart:  Irregularly irregular Abdomen:  Soft,nontender, BS active,nonpalp mass or hsm.   Rectal:  Deferred  Msk:  Symmetrical without gross deformities. . Pulses:  Normal pulses noted. Extremities: Healed midline incision right knee. No obvious erythema. Right knee warm to palpation compared to left knee. Neurologic:Alert and  oriented x4;  grossly normal neurologically. Skin: Intact without significant lesions or rashes.. Psych: Alert and cooperative. Normal mood and affect.  Intake/Output from previous day: 03/05 0701 - 03/06 0700 In: 2323.3 [I.V.:1473.3; IV Piggyback:850] Out: -   Lab Results:  Recent Labs  11/08/15 0554 11/09/15 0605 11/10/15 0511  WBC 15.0* 12.8* 11.3*  HGB 8.2* 7.9* 8.3*  HCT 26.0* 25.6* 26.8*  PLT 196 179 202   BMET  Recent Labs  11/08/15 0554 11/09/15 0605 11/10/15 0511  NA 143 140 136  K 3.6 3.9 3.8  CL 117* 116* 112*  CO2 19* 17* 17*  GLUCOSE 103* 95 120*  BUN 13 9 11   CREATININE 0.87 0.84 0.96  CALCIUM 8.2* 8.3* 8.3*   LFT  Recent Labs  11/10/15 0511  PROT 5.7*  ALBUMIN 2.5*  AST 20    ALT 12*  ALKPHOS 75  BILITOT 0.5   PT/INR  Recent Labs  11/09/15 0605 11/10/15 0511  LABPROT 26.9* 18.1*  INR 2.52* 1.49    Studies/Results: Dg Knee 1-2 Views Right  11/08/2015  CLINICAL DATA:  Pain for 2 days. No recent trauma. Total knee replacement 3 years ago. EXAM: RIGHT KNEE - 1-2 VIEW COMPARISON:  None. FINDINGS: The patient is status post knee replacement. Hardware is in good position with no loosening or failure identified. Surgical screws and metallic tacks project over the proximal tibia, also in good position. No joint effusion. Mild anterior soft tissue swelling is seen. No bony erosion or bony lesions. No fractures are seen. IMPRESSION: Mild anterior soft tissue swelling.  No other abnormalities. Electronically Signed   By: Dorise Bullion III M.D   On: 11/08/2015 16:00   Endoscopies: Colonoscopy 08/11/2011:Diveticulosis, otherwise nl. Advised to repeat in 5 years due to [facility] CRC.  EGD 01/16/2009:  The upper, middle, and distal third of the esophagus were carefully inspected and no abnormalities were noted. The z-line was well seen at the GEJ. The endoscope was pushed into the fundus which was normal including a retroflexed view. The antrum,gastric body, first and second part of the duodenum were unremarkable (see image1, image2, and image3). Retroflexed views revealed no abnormalities. The scope was then withdrawn from the patient and the procedure completed.  COMPLICATIONS: None  ENDOSCOPIC IMPRESSION:  1) Normal EGD RECOMMENDATIONS:  IMPRESSION/PLAN: 82 year old female with a history of atrial fibrillation on chronic anticoagulation, admitted due to dyspnea, found to have pneumonia and an iron deficiency anemia with heme positive stools. Patient has a strong family history of colon cancer and is due for surveillance colonoscopy. In addition, she complains of in 8-10 month history of burning epigastric pain that is worse on an empty stomach and temporarily  alleviated with ingestion of food. She has been using ranitidine with no relief. One set of blood cultures with preliminary report of gram positive cocci in chains. Currently on Zosyn and vancomycin. INR currently 1.49 as patient is off anticoagulation. We will review with attending asked to scheduling patient for possible colonoscopy and EGD, though she is high risk. Have spent time reviewing possible risks of the procedure, the patient and her daughter are clear that she would like to identify the source of her blood loss. Continue PPI.If colon?eGD  nonrevealing, may need capsule endoscopy at some point.    Hvozdovic, Deloris Ping 11/10/2015,  Pager [phone]  Mon-Fri 8a-5p [phone] after 5p, weekends, holidays    [facility] Attending   I have taken an interval history, reviewed the chart and examined the patient. I agree with the Advanced Practitioner's note, impression and recommendations.    Heme + anemia - epigastric pain. Recovering from pneumonia/sepsis. Hx of central retinal artery occlusion in Ao Stenosis, on warfarin (held) B12 deficiency Atrial fibrillation  Plan for EGD colonoscopy tomorrow.  The risks and benefits as well as alternatives of endoscopic procedure(s) have been discussed and reviewed. All questions answered. The patient agrees to proceed.

## 2015-11-10 NOTE — Progress Notes (Signed)
Pharmacy Antibiotic Note  [name] is a 82 y.o. female admitted on 11/07/2015 with sepsis and was initially started on ceftriaxone and azithromycin for CAP.  On 3/4, MD adjusting antibiotics for possible aspiration pneumonia with Zosyn and Pharmacy has been consulted for vancomycin dosing for new concern of osteomyelitis of the R knee and positive blood culture which is likely contaminant however  Plan: - Continue current vanc dosing, otherwise likely can discontinue vanc if no further concern for bacteremia or knee osteomyelitis - Will check a vanc trough tomorrow AM - Continue current Zosyn dosing  Height: 5\' 1"  (154.9 cm) Weight: 151 lb 10.8 oz (68.8 kg) IBW/kg (Calculated) : 47.8  Temp (24hrs), Avg:99.4 F (37.4 C), Min:98.6 F (37 C), Max:99.8 F (37.7 C)   Recent Labs Lab 11/07/15 1228 11/07/15 1249 11/07/15 1629 11/07/15 2208 11/08/15 0554 11/09/15 0605 11/10/15 0511  WBC 15.0*  --   --   --  15.0* 12.8* 11.3*  CREATININE 1.09*  --   --   --  0.87 0.84 0.96  LATICACIDVEN  --  2.13* 2.26* 1.8  --   --   --     Estimated Creatinine Clearance: 42.2 mL/min (by C-G formula based on Cr of 0.96).    Allergies  Allergen Reactions  . Amiodarone      Thyroid and liver and kidney problems  . Codeine Nausea And Vomiting  . Gabapentin Itching  . Hydrocodone Other (See Comments)    hallucination  . Morphine And Related Nausea And Vomiting  . Requip [Ropinirole Hcl]     Headache   . Zinc     nausea    Antimicrobials this admission: 3/3 >> Rocephin >> 3/4 3/3 >> Azithromycin >> 3/4 >> Zosyn (MD) >> 3/4 >> Vancomycin >>  Dose adjustments this admission: ---  3/3 BCx: 1/2 GPC in chains 3/3 UCx: multiple none predominant, final 3/3 Sputum: normal flora 3/3 Influenza panel: neg 3/3 urine strep/legionella: neg/IP  Thank you for allowing pharmacy to be a part of this patient's care.   Adrian Saran, PharmD, BCPS Pager [phone] 11/10/2015 8:50 AM

## 2015-11-11 ENCOUNTER — Encounter (HOSPITAL_COMMUNITY): Payer: Self-pay

## 2015-11-11 ENCOUNTER — Inpatient Hospital Stay (HOSPITAL_COMMUNITY): Payer: Medicare Other | Admitting: Anesthesiology

## 2015-11-11 ENCOUNTER — Encounter (HOSPITAL_COMMUNITY)
Admission: EM | Disposition: A | Discharge status: Home Health | Payer: Self-pay | Source: Home / Self Care | Attending: Internal Medicine

## 2015-11-11 DIAGNOSIS — C169 Malignant neoplasm of stomach, unspecified: Secondary | ICD-10-CM

## 2015-11-11 DIAGNOSIS — R195 Other fecal abnormalities: Secondary | ICD-10-CM

## 2015-11-11 DIAGNOSIS — D490 Neoplasm of unspecified behavior of digestive system: Secondary | ICD-10-CM

## 2015-11-11 HISTORY — PX: ESOPHAGOGASTRODUODENOSCOPY: SHX5428

## 2015-11-11 HISTORY — PX: COLONOSCOPY: SHX5424

## 2015-11-11 HISTORY — DX: Malignant neoplasm of stomach, unspecified: C16.9

## 2015-11-11 LAB — PROTIME-INR
INR: 1.2 (ref 0.00–1.49)
Prothrombin Time: 15.3 seconds — ABNORMAL HIGH (ref 11.6–15.2)

## 2015-11-11 LAB — BASIC METABOLIC PANEL
Anion gap: 9 (ref 5–15)
BUN: 9 mg/dL (ref 6–20)
CO2: 17 mmol/L — AB (ref 22–32)
Calcium: 8.9 mg/dL (ref 8.9–10.3)
Chloride: 114 mmol/L — ABNORMAL HIGH (ref 101–111)
Creatinine, Ser: 0.92 mg/dL (ref 0.44–1.00)
GFR calc non Af Amer: 58 mL/min — ABNORMAL LOW (ref >60)
Glucose, Bld: 103 mg/dL — ABNORMAL HIGH (ref 65–99)
Potassium: 4.1 mmol/L (ref 3.5–5.1)
SODIUM: 140 mmol/L (ref 135–145)

## 2015-11-11 LAB — CBC
HEMATOCRIT: 28.1 % — AB (ref 36.0–46.0)
Hemoglobin: 8.7 g/dL — ABNORMAL LOW (ref 12.0–15.0)
MCH: 26.7 pg (ref 26.0–34.0)
MCHC: 31 g/dL (ref 30.0–36.0)
MCV: 86.2 fL (ref 78.0–100.0)
Platelets: 234 10*3/uL (ref 150–400)
RBC: 3.26 MIL/uL — ABNORMAL LOW (ref 3.87–5.11)
RDW: 15.5 % (ref 11.5–15.5)
WBC: 10.7 10*3/uL — ABNORMAL HIGH (ref 4.0–10.5)

## 2015-11-11 LAB — VANCOMYCIN, TROUGH: VANCOMYCIN TR: 13 ug/mL (ref 10.0–20.0)

## 2015-11-11 LAB — CULTURE, BLOOD (ROUTINE X 2)

## 2015-11-11 SURGERY — COLONOSCOPY
Anesthesia: Monitor Anesthesia Care

## 2015-11-11 MED ORDER — ONDANSETRON HCL 4 MG/2ML IJ SOLN
INTRAMUSCULAR | Status: DC | PRN
  Administered 2015-11-11: 4 mg via INTRAVENOUS

## 2015-11-11 MED ORDER — ESMOLOL HCL 100 MG/10ML IV SOLN
INTRAVENOUS | Status: DC | PRN
  Administered 2015-11-11 (×2): 20 mg via INTRAVENOUS

## 2015-11-11 MED ORDER — SODIUM CHLORIDE 0.9 % IV SOLN: INTRAVENOUS | Status: DC

## 2015-11-11 MED ORDER — LIDOCAINE HCL (CARDIAC) 20 MG/ML IV SOLN
INTRAVENOUS | Status: DC | PRN
  Administered 2015-11-11: 80 mg via INTRAVENOUS

## 2015-11-11 MED ORDER — LACTATED RINGERS IV SOLN
INTRAVENOUS | Status: DC
  Administered 2015-11-11: 14:00:00 via INTRAVENOUS

## 2015-11-11 MED ORDER — PROPOFOL 10 MG/ML IV BOLUS
INTRAVENOUS | Status: DC | PRN
  Administered 2015-11-11 (×2): 10 mg via INTRAVENOUS

## 2015-11-11 MED ORDER — PROPOFOL 10 MG/ML IV BOLUS
INTRAVENOUS | Status: AC
  Filled 2015-11-11: qty 60

## 2015-11-11 MED ORDER — ESMOLOL HCL 100 MG/10ML IV SOLN
INTRAVENOUS | Status: AC
  Filled 2015-11-11: qty 10

## 2015-11-11 MED ORDER — PROPOFOL 500 MG/50ML IV EMUL
INTRAVENOUS | Status: DC | PRN
  Administered 2015-11-11: 75 ug/kg/min via INTRAVENOUS

## 2015-11-11 MED ORDER — ONDANSETRON HCL 4 MG/2ML IJ SOLN
INTRAMUSCULAR | Status: AC
  Filled 2015-11-11: qty 2

## 2015-11-11 NOTE — Op Note (Signed)
[facility] Patient Name: [name] Procedure Date: 11/11/2015 MRN: [id number] Attending MD: [name] , MD Date of Birth: 22-Sep-1933 CSN:  Age: 82 Admit Type: Inpatient Account #: [id number] Procedure:            Upper GI endoscopy Indications:          Epigastric abdominal pain, Heme positive stool Providers:            [name], MD, Sarah Monday, RN, Cherylynn Ridges,                        Technician, Adair Laundry, CRNA Referring MD:          Medicines:            Monitored Anesthesia Care Complications:        No immediate complications. Estimated Blood Loss: Estimated blood loss: none. Procedure:            Pre-Anesthesia Assessment:                       - Prior to the procedure, a History and Physical was                        performed, and patient medications and allergies were                        reviewed. The patient's tolerance of previous                        anesthesia was also reviewed. The risks and benefits of                        the procedure and the sedation options and risks were                        discussed with the patient. All questions were                        answered, and informed consent was obtained. Prior                        Anticoagulants: The patient last took Coumadin                        (warfarin) 3 days prior to the procedure. ASA Grade                        Assessment: III - A patient with severe systemic                        disease. After reviewing the risks and benefits, the                        patient was deemed in satisfactory condition to undergo                        the procedure.                       After obtaining informed consent, the endoscope was  passed under direct vision. Throughout the procedure,                        the patient's blood pressure, pulse, and oxygen                        saturations were monitored continuously. The EG-[id number]                 [id number]) scope was introduced through the mouth, and                        advanced to the second part of duodenum. The upper GI                        endoscopy was accomplished with ease. The patient                        tolerated the procedure well. Scope In: Scope Out: Scope Withdrawal Time: Total Procedure Duration: Findings:      A large, polypoid mass with oozing bleeding and stigmata of recent       bleeding was found on the anterior wall of the stomach. Biopsies were       taken with a cold forceps for histology.      The exam was otherwise without abnormality. 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 Biopsied.                       - The examination was otherwise normal. Moderate Sedation:      Per Anesthesia Care Recommendation:       - Return patient to hospital ward for ongoing care.                       - Resume regular diet.                       - Continue present medications.                       - Await pathology results.                       - Discontinue Coumadin (warfarin) until full treatment                        plans known. Procedure Code(s):    --- Professional ---                       [phone], Esophagogastroduodenoscopy, flexible, transoral;                        with biopsy, single or multiple Diagnosis Code(s):    --- Professional ---                       D49.0, Neoplasm of unspecified behavior of digestive  system                       R10.13, Epigastric pain                       R19.5, Other fecal abnormalities CPT copyright 2016 American Medical Association. All rights reserved. The codes documented in this report are preliminary and upon coder review may  be revised to meet current compliance requirements. Attending Participation: [name], MD [name], MD 11/11/2015 3:11:41 PM Number of Addenda: 0

## 2015-11-11 NOTE — Transfer of Care (Signed)
Immediate Anesthesia Transfer of Care Note  Patient: [name]  Procedure(s) Performed: Procedure(s): COLONOSCOPY (N/A) ESOPHAGOGASTRODUODENOSCOPY (EGD) (N/A)  Patient Location: ENDO  Anesthesia Type:MAC  Level of Consciousness:  sedated, patient cooperative and responds to stimulation  Airway & Oxygen Therapy:Patient Spontanous Breathing and Patient connected to face mask oxgen  Post-op Assessment:  Report given to ENDO RN and Post -op Vital signs reviewed and stable  Post vital signs:  Reviewed and stable  Last Vitals:  Filed Vitals:   11/11/15 1349 11/11/15 1514  BP: 180/112 164/89  Pulse: 103 109  Temp: 37.4 C   Resp: 18 17    Complications: No apparent anesthesia complications

## 2015-11-11 NOTE — Progress Notes (Signed)
[facility] PROGRESS NOTE  [name] [id number] DOB: February 08, 1934 DOA: 11/07/2015 PCP: [name], MD  Summary [name] is a very pleasant 82 yo female with multiple medical issues including Paroxysmal atrial fibrillation on chronic anticoagulation, essential htn, Hypothyroidism, history of right total knee replacement(?with superinfection needing "cleaning up"), who presented to [facility] ED with progressively worsening dyspnea associated with subjective fevers, chills, cough productive of yellow sputum, chest tightness that occured with coughing spells, malaise, poor oral intake, wheezing, and difficulty with ambulation due to dyspnea. She was found to be septic with suggestion of pneumonia, and to have afib with rvr. Her CT chest showed "Bronchitic changes with bibasilar atelectasis. Single nonspecific minimally enlarged precarinal lymph node...". She also complained of right knee pain and this raiseed concern for superinfection prompting an XRay of the right knee which showed "Mild anterior soft tissue swelling. No other abnormalities". Blood culture results showed Coagulase negative staph in one bottle, likely a contaminant. CRP is elevated but this appears to be related to ongoing infection, not necessarily osteomyelitis. Patient seems to be responding to antibiotics which will be de-escalated to Zosyn today, but has stay has unfortunately been punctuated by finding of a gastric mass by EGD/colonoscopy today. Appreciate GI.   Patient was noted to have a drop in hemoglobin to 7.9 g/dL and has stool Hemoccult was positive, with low iron level and vitamin B12 also low resulting in GI consultation. She was taken off Coumadin and given vitamin K prior to endoscopy. Obviously, she will need to gastric mass addressed before anticoagulation can be resumed. Will await pathology results and work with GI regarding next steps-?general surgery/oncology consultation. Meanwhile, [name] is  a Jehovah's witness and declines blood products. She is therefore on ferrous sulfate/Aranesp for now. She is also on vitamin B12 supplements. Her hemoglobin seems to be holding up. She denies any complaints. Plan Staph coagulase negative (HCC)/Pneumonia  Follow septic workup  Discontinue Vancomycin  Continue Zosyn to complete 7 days of antibiotics Right knee pain/S/P right TKA  CRP 21.9  Less pain, monitor for now. Atrial fibrillation (HCC)/Cardiac pacemaker (06/2010)/Essential hypertension/Long term current use of anticoagulant therapy  Continue atenolol  Off Coumadin in view of gi loss- ?Resume once GI bleed addressed. Hypothyroidism  TSH normal  Continue Synthroid CKD Stage 3/Acute kidney injury ([place])   Renal function now normal   Monitor Iron deficiency anemia/vitamin B12 deficiency/stool Hemoccult positive anemia/gastric mass  Follow pathology of gastric mass biopsy  Continue Iron supplements/vitamin B12 supplements, Jehovah's Witness, declines blood products.   Aranesp Code Status: Full Code Family Communication: Patient, sister and daughter at the bedside Disposition Plan: Home eventually   Consultants:  GI  Procedures:  EGD/colonoscopy  Antibiotics:  Ceftriaxone 11/07/15>>11/08/15  Vancomycin 11/08/15>>11/11/15  Zosyn 11/08/15>>  Zithromax 11/08/15>>11/11/15  HPI/Subjective: Denies any complaints.  Objective: Filed Vitals:   11/11/15 1554 11/11/15 2114  BP: 157/93 138/63  Pulse: 105 103  Temp: 98.6 F (37 C) 98.8 F (37.1 C)  Resp: 22 20    Intake/Output Summary (Last 24 hours) at 11/11/15 2118 Last data filed at 11/11/15 1830  Gross per 24 hour  Intake    700 ml  Output      0 ml  Net    700 ml   Filed Weights   11/07/15 1700 11/11/15 1349  Weight: 68.8 kg (151 lb 10.8 oz) 65.97 kg (145 lb 7 oz)    Exam:   General:  Comfortable at rest.  Cardiovascular: S1-S2 normal. No murmurs. Pulse  regular.  Respiratory: Good air entry  bilaterally. No rhonchi or rales.  Abdomen: Soft and nontender. Normal bowel sounds. No organomegaly.  Musculoskeletal: No pedal edema   Neurological: Intact  Data Reviewed: Basic Metabolic Panel:  Recent Labs Lab 11/07/15 1228 11/07/15 1740 11/08/15 0554 11/09/15 0605 11/10/15 0511 11/11/15 0439  NA 139  --  143 140 136 140  K 3.8  --  3.6 3.9 3.8 4.1  CL 114*  --  117* 116* 112* 114*  CO2 18*  --  19* 17* 17* 17*  GLUCOSE 140*  --  103* 95 120* 103*  BUN 19  --  13 9 11 9   CREATININE 1.09*  --  0.87 0.84 0.96 0.92  CALCIUM 8.1*  --  8.2* 8.3* 8.3* 8.9  MG  --  1.5*  --   --   --   --    Liver Function Tests:  Recent Labs Lab 11/07/15 1228 11/09/15 0605 11/10/15 0511  AST 18 18 20   ALT 12* 11* 12*  ALKPHOS 107 72 75  BILITOT 0.4 0.7 0.5  PROT 6.3* 5.4* 5.7*  ALBUMIN 3.3* 2.4* 2.5*   No results for input(s): LIPASE, AMYLASE in the last 168 hours. No results for input(s): AMMONIA in the last 168 hours. CBC:  Recent Labs Lab 11/07/15 1228 11/08/15 0554 11/09/15 0605 11/10/15 0511 11/11/15 0439  WBC 15.0* 15.0* 12.8* 11.3* 10.7*  NEUTROABS 13.7*  --  9.8*  --   --   HGB 10.2* 8.2* 7.9* 8.3* 8.7*  HCT 32.7* 26.0* 25.6* 26.8* 28.1*  MCV 86.7 86.7 86.8 86.7 86.2  PLT 238 196 179 202 234   Cardiac Enzymes: No results for input(s): CKTOTAL, CKMB, CKMBINDEX, TROPONINI in the last 168 hours. BNP (last 3 results) No results for input(s): BNP in the last 8760 hours.  ProBNP (last 3 results) No results for input(s): PROBNP in the last 8760 hours.  CBG: No results for input(s): GLUCAP in the last 168 hours.  Recent Results (from the past 240 hour(s))  Culture, blood (routine x 2)     Status: None (Preliminary result)   Collection Time: 11/07/15 12:28 PM  Result Value Ref Range Status   Specimen Description BLOOD RIGHT ANTECUBITAL  Final   Special Requests IN PEDIATRIC BOTTLE 4ML  Final   Culture   Final    NO GROWTH 4 DAYS Performed at [facility]    Report Status PENDING  Incomplete  Culture, blood (routine x 2)     Status: None   Collection Time: 11/07/15 12:29 PM  Result Value Ref Range Status   Specimen Description BLOOD LEFT HAND  Final   Special Requests IN PEDIATRIC BOTTLE 2ML  Final   Culture  Setup Time   Final    GRAM POSITIVE COCCI IN CHAINS IN PEDIATRIC BOTTLE CRITICAL RESULT CALLED TO, READ BACK BY AND VERIFIED WITH: C ADDISON,RN @0449  11/08/15 MKELLY    Culture   Final    VIRIDANS STREPTOCOCCUS THE SIGNIFICANCE OF ISOLATING THIS ORGANISM FROM A SINGLE SET OF BLOOD CULTURES WHEN MULTIPLE SETS ARE DRAWN IS UNCERTAIN. PLEASE NOTIFY THE MICROBIOLOGY DEPARTMENT WITHIN ONE WEEK IF SPECIATION AND SENSITIVITIES ARE REQUIRED. Performed at [facility]    Report Status 11/11/2015 FINAL  Final  Urine culture     Status: None   Collection Time: 11/07/15  4:35 PM  Result Value Ref Range Status   Specimen Description URINE, CLEAN CATCH  Final   Special Requests NONE  Final  Culture   Final    MULTIPLE SPECIES PRESENT, SUGGEST RECOLLECTION Performed at [facility]    Report Status 11/09/2015 FINAL  Final  Culture, sputum-assessment     Status: None   Collection Time: 11/07/15  8:42 PM  Result Value Ref Range Status   Specimen Description SPUTUM  Final   Special Requests NONE  Final   Sputum evaluation   Final    THIS SPECIMEN IS ACCEPTABLE. RESPIRATORY CULTURE REPORT TO FOLLOW.   Report Status 11/07/2015 FINAL  Final  Culture, respiratory (NON-Expectorated)     Status: None   Collection Time: 11/07/15  8:42 PM  Result Value Ref Range Status   Specimen Description SPUTUM  Final   Special Requests NONE  Final   Gram Stain   Final    ABUNDANT WBC PRESENT, PREDOMINANTLY PMN FEW SQUAMOUS EPITHELIAL CELLS PRESENT MODERATE GRAM POSITIVE COCCI IN PAIRS IN CLUSTERS FEW GRAM POSITIVE RODS THIS SPECIMEN IS ACCEPTABLE FOR SPUTUM CULTURE Performed at [facility]    Culture   Final     NORMAL OROPHARYNGEAL FLORA Performed at [facility]    Report Status 11/10/2015 FINAL  Final  Culture, blood (routine x 2)     Status: None (Preliminary result)   Collection Time: 11/10/15  5:10 AM  Result Value Ref Range Status   Specimen Description BLOOD LEFT ARM  Final   Special Requests IN PEDIATRIC BOTTLE 3ML  Final   Culture   Final    NO GROWTH 1 DAY Performed at [facility]    Report Status PENDING  Incomplete  Culture, blood (routine x 2)     Status: None (Preliminary result)   Collection Time: 11/10/15  8:00 AM  Result Value Ref Range Status   Specimen Description BLOOD LEFT ARM  Final   Special Requests BOTTLES DRAWN AEROBIC AND ANAEROBIC [place]  Final   Culture   Final    NO GROWTH 1 DAY Performed at [facility]    Report Status PENDING  Incomplete     Studies: No results found.  Scheduled Meds: . atenolol  100 mg Oral BID  . azithromycin  500 mg Intravenous Q24H  . Darbepoetin Alfa  25 mcg Subcutaneous Q7 days  . guaiFENesin  1,200 mg Oral BID  . levothyroxine  100 mcg Oral QAC breakfast  . pantoprazole (PROTONIX) IV  40 mg Intravenous Q12H  . piperacillin-tazobactam  3.375 g Intravenous Q8H  . potassium chloride SA  20 mEq Oral BID  . pregabalin  75 mg Oral BID  . sertraline  50 mg Oral Daily  . triamcinolone cream   Topical TID  . vitamin B-12  1,000 mcg Oral Daily   Continuous Infusions:    Time spent: 25 minutes    Madalen Gavin  [facility] Pager [phone]. If 7PM-7AM, please contact night-coverage at [email or web address], password [facility] 11/11/2015, 9:18 PM  LOS: 4 days

## 2015-11-11 NOTE — Anesthesia Postprocedure Evaluation (Signed)
Anesthesia Post Note  Patient: [name]  Procedure(s) Performed: Procedure(s) (LRB): COLONOSCOPY (N/A) ESOPHAGOGASTRODUODENOSCOPY (EGD) (N/A)  Patient location during evaluation: Endoscopy Anesthesia Type: MAC Level of consciousness: awake and alert Pain management: pain level controlled Vital Signs Assessment: post-procedure vital signs reviewed and stable Respiratory status: spontaneous breathing, nonlabored ventilation, respiratory function stable and patient connected to nasal cannula oxygen Cardiovascular status: stable and blood pressure returned to baseline Anesthetic complications: no    Last Vitals:  Filed Vitals:   11/11/15 1349 11/11/15 1514  BP: 180/112 164/89  Pulse: 103 109  Temp: 37.4 C   Resp: 18 17    Last Pain:  Filed Vitals:   11/11/15 1515  PainSc: 8                  Catalina Gravel

## 2015-11-11 NOTE — Anesthesia Preprocedure Evaluation (Addendum)
Anesthesia Evaluation  Patient identified by MRN, date of birth, ID band Patient awake    Reviewed: Allergy & Precautions, NPO status , Patient's Chart, lab work & pertinent test results, reviewed documented beta blocker date and time   History of Anesthesia Complications (+) PONV and history of anesthetic complications  Airway Mallampati: II  TM Distance: >3 FB Neck ROM: Full    Dental  (+) Dental Advisory Given, Upper Dentures   Pulmonary pneumonia, unresolved,     + decreased breath sounds      Cardiovascular hypertension, Pt. on medications and Pt. on home beta blockers + CAD and + Peripheral Vascular Disease  Normal cardiovascular exam+ dysrhythmias Atrial Fibrillation + pacemaker + Valvular Problems/Murmurs AS and AI  Rhythm:Regular Rate:Normal  PDA ligation at age 82 Tachy-brady syndrome s/p PPM insertion  Echo 2/17: Study Conclusions  - Left ventricle: The cavity size was normal. Wall thickness wasincreased in a pattern of moderate LVH. Systolic function wasnormal. The estimated ejection fraction was in the range of 60%to 65%. Wall motion was normal; there were no regional wallmotion abnormalities. - Aortic valve: There was mild stenosis. There was moderateregurgitation. Valve area (VTI): 0.87 cm^2. Valve area (Vmax):0.87 cm^2. Valve area (Vmean): 0.85 cm^2. - Left atrium: The atrium was mildly dilated. - Tricuspid valve: There was moderate regurgitation.    Neuro/Psych  Headaches, PSYCHIATRIC DISORDERS Anxiety  Neuromuscular disease (neuropathy right leg)    GI/Hepatic Neg liver ROS, GERD  Medicated and Poorly Controlled,GIB Gastroparesis   Endo/Other  Hypothyroidism   Renal/GU Renal InsufficiencyRenal diseaseRenal cancer s/p nephrectomy     Musculoskeletal negative musculoskeletal ROS (+)   Abdominal   Peds  Hematology  (+) Blood dyscrasia, anemia ,   Anesthesia Other Findings Day of surgery  medications reviewed with the patient.  Reproductive/Obstetrics                           Anesthesia Physical Anesthesia Plan  ASA: III  Anesthesia Plan: MAC   Post-op Pain Management:    Induction: Intravenous  Airway Management Planned: Nasal Cannula  Additional Equipment:   Intra-op Plan:   Post-operative Plan:   Informed Consent: I have reviewed the patients History and Physical, chart, labs and discussed the procedure including the risks, benefits and alternatives for the proposed anesthesia with the patient or authorized representative who has indicated his/her understanding and acceptance.   Dental advisory given  Plan Discussed with: CRNA and Anesthesiologist  Anesthesia Plan Comments: (Discussed risks/benefits/alternatives to MAC sedation including need for ventilatory support, hypotension, need for conversion to general anesthesia.  All patient questions answered.  Patient wished to proceed.)        Anesthesia Quick Evaluation

## 2015-11-11 NOTE — Op Note (Signed)
[facility] Patient Name: [name] Procedure Date: 11/11/2015 MRN: [id number] Attending MD: [name] , MD Date of Birth: 1934-06-13 CSN:  Age: 82 Admit Type: Inpatient Account #: [id number] Procedure:            Colonoscopy Indications:          Heme positive stool Providers:            [name], MD, Sarah Monday, RN, Cherylynn Ridges,                        Technician, Adair Laundry, CRNA Referring MD:          Medicines:            Monitored Anesthesia Care Complications:        No immediate complications. Estimated Blood Loss: Estimated blood loss: none. Procedure:            Pre-Anesthesia Assessment:                       - Prior to the procedure, a History and Physical was                        performed, and patient medications and allergies were                        reviewed. The patient's tolerance of previous                        anesthesia was also reviewed. The risks and benefits of                        the procedure and the sedation options and risks were                        discussed with the patient. All questions were                        answered, and informed consent was obtained. Prior                        Anticoagulants: The patient last took Coumadin                        (warfarin) 3 days prior to the procedure. ASA Grade                        Assessment: III - A patient with severe systemic                        disease. After reviewing the risks and benefits, the                        patient was deemed in satisfactory condition to undergo                        the procedure.                       After obtaining informed consent, the colonoscope was  passed under direct vision. Throughout the procedure,                        the patient's blood pressure, pulse, and oxygen                        saturations were monitored continuously. The [id number]                        [id number]) scope  was introduced through the anus and                        advanced to the the cecum, identified by appendiceal                        orifice and ileocecal valve. The colonoscopy was                        performed without difficulty. The patient tolerated the                        procedure well. The quality of the bowel preparation                        was adequate to identify polyps. The ileocecal valve,                        appendiceal orifice, and rectum were photographed. Scope In: 2:49:09 PM Scope Out: 3:00:15 PM Scope Withdrawal Time: 0 hours 6 minutes 40 seconds  Total Procedure Duration: 0 hours 11 minutes 6 seconds  Findings:      The perianal exam was abnormal.      The digital rectal exam findings include decreased sphincter tone.      The colon (entire examined portion) appeared normal. Impression:           - Abnormal perianal exam.                       - Decreased sphincter tone found on digital rectal exam.                       - The entire examined colon is normal.                       - No specimens collected. Moderate Sedation:      Per Anesthesia Care Recommendation:       - Return patient to hospital ward for ongoing care.                       - Resume regular diet.                       - Continue present medications.                       - No repeat colonoscopy due to age. Procedure Code(s):    --- Professional ---                       [id number], Colonoscopy, flexible; diagnostic, including  collection of specimen(s) by brushing or washing, when                        performed (separate procedure) Diagnosis Code(s):    --- Professional ---                       K62.89, Other specified diseases of anus and rectum                       R19.5, Other fecal abnormalities CPT copyright 2016 American Medical Association. All rights reserved. The codes documented in this report are preliminary and upon coder review may  be revised to  meet current compliance requirements. Attending Participation: [name], MD [name], MD 11/11/2015 3:17:04 PM Number of Addenda: 0

## 2015-11-11 NOTE — Progress Notes (Signed)
Pharmacy Antibiotic Note  [name] is a 82 y.o. female admitted on 11/07/2015 with sepsis and was initially started on ceftriaxone and azithromycin for CAP.  On 3/4, MD adjusting antibiotics for possible aspiration pneumonia with Zosyn and Pharmacy has been consulted for vancomycin dosing for new concern of osteomyelitis of the R knee and positive blood culture (only 1/2) which is likely contaminant.    Plan: - Zosyn 3.375g IV Q8H infused over 4hrs.  - Continue Vancomycin 500 mg IV q12h. - Follow up renal fxn, culture results, and clinical course. - Follow up narrowing antibiotics if no further concern for bacteremia or knee osteomyelitis   Height: 5\' 1"  (154.9 cm) Weight: 151 lb 10.8 oz (68.8 kg) IBW/kg (Calculated) : 47.8  Temp (24hrs), Avg:98.1 F (36.7 C), Min:98 F (36.7 C), Max:98.2 F (36.8 C)   Recent Labs Lab 11/07/15 1228 11/07/15 1249 11/07/15 1629 11/07/15 2208 11/08/15 0554 11/09/15 0605 11/10/15 0511 11/11/15 0439  WBC 15.0*  --   --   --  15.0* 12.8* 11.3* 10.7*  CREATININE 1.09*  --   --   --  0.87 0.84 0.96 0.92  LATICACIDVEN  --  2.13* 2.26* 1.8  --   --   --   --   VANCOTROUGH  --   --   --   --   --   --   --  13    Estimated Creatinine Clearance: 44 mL/min (by C-G formula based on Cr of 0.92).    Allergies  Allergen Reactions  . Amiodarone      Thyroid and liver and kidney problems  . Codeine Nausea And Vomiting  . Gabapentin Itching  . Hydrocodone Other (See Comments)    hallucination  . Morphine And Related Nausea And Vomiting  . Requip [Ropinirole Hcl]     Headache   . Zinc     nausea    Antimicrobials this admission: 3/3 >> Rocephin >> 3/4 3/3 >> Azithromycin >> 3/4 >> Zosyn (MD) >> 3/4 >> Vancomycin >>  Dose adjustments this admission: 3/7 0500 VT = 13 on Vanc 500mg  q12h  Micro:  3/3 BCx: 1/2 GPC in chains 3/3 UCx: multiple none predominant, final 3/3 Sputum: normal flora 3/3 Influenza panel: neg 3/3 urine  strep/legionella: neg/IP 3/6 BCx: sent  Thank you for allowing pharmacy to be a part of this patient's care.  Gretta Arab PharmD, BCPS Pager [phone] 11/11/2015 7:03 AM

## 2015-11-11 NOTE — Progress Notes (Signed)
Tol bowel prep. Stools watery but dark this morning. Will give tap water enema x 2. Hgb 8.7 this am. For colon/egd later today.  Lori Hvozdovic, PA-C [facility]

## 2015-11-11 NOTE — Progress Notes (Signed)
Tap water enema given per MD orders. Pt able to tolerate about 826ml total of the tap water enema, in small intervals. Stool color went from yellow with yellow flecks of stool, to a small amount of clear with yellow flecks, and then lastly back to yellow with very small amounts of yellow flecks by the end of the enema process.  Pt c/o abdominal cramping and back pain during enema adminstration. Bedside RN will continue to monitor color of stool.   [name] [facility]) 11/11/2015 12:00 PM

## 2015-11-11 NOTE — Care Management Note (Signed)
Case Management Note  Patient Details  Name: PATRIKA RHOADS MRN: [id number] Date of Birth: 06/17/1934  Subjective/Objective: PT recc HHPT. [facility] chosen by dtr. Has used [facility] in [place] order,[id number].                   Action/Plan:d/c plan home w/HHC.   Expected Discharge Date:   (UNKNOWN)               Expected Discharge Plan:  [place]  In-House Referral:     Discharge planning Services  CM Consult  Post Acute Care Choice:    Choice offered to:  Adult Children  DME Arranged:    DME Agency:     HH Arranged:    HH Agency:  [place]  Status of Service:  In process, will continue to follow  Medicare Important Message Given:  Yes Date Medicare IM Given:    Medicare IM give by:    Date Additional Medicare IM Given:    Additional Medicare Important Message give by:     If discussed at [place] of Stay Meetings, dates discussed:    Additional Comments:  Dessa Phi, RN 11/11/2015, 4:07 PM

## 2015-11-12 ENCOUNTER — Encounter (HOSPITAL_COMMUNITY): Payer: Self-pay | Admitting: Internal Medicine

## 2015-11-12 ENCOUNTER — Inpatient Hospital Stay (HOSPITAL_COMMUNITY): Payer: Medicare Other

## 2015-11-12 DIAGNOSIS — M25561 Pain in right knee: Secondary | ICD-10-CM

## 2015-11-12 DIAGNOSIS — I10 Essential (primary) hypertension: Secondary | ICD-10-CM

## 2015-11-12 DIAGNOSIS — Z7901 Long term (current) use of anticoagulants: Secondary | ICD-10-CM

## 2015-11-12 DIAGNOSIS — N183 Chronic kidney disease, stage 3 (moderate): Secondary | ICD-10-CM

## 2015-11-12 DIAGNOSIS — K319 Disease of stomach and duodenum, unspecified: Secondary | ICD-10-CM

## 2015-11-12 DIAGNOSIS — E038 Other specified hypothyroidism: Secondary | ICD-10-CM

## 2015-11-12 LAB — BASIC METABOLIC PANEL
Anion gap: 11 (ref 5–15)
BUN: 10 mg/dL (ref 6–20)
CHLORIDE: 113 mmol/L — AB (ref 101–111)
CO2: 18 mmol/L — ABNORMAL LOW (ref 22–32)
Calcium: 8.7 mg/dL — ABNORMAL LOW (ref 8.9–10.3)
Creatinine, Ser: 1.04 mg/dL — ABNORMAL HIGH (ref 0.44–1.00)
GFR calc Af Amer: 58 mL/min — ABNORMAL LOW (ref >60)
GFR, EST NON AFRICAN AMERICAN: 50 mL/min — AB (ref >60)
GLUCOSE: 95 mg/dL (ref 65–99)
POTASSIUM: 3.8 mmol/L (ref 3.5–5.1)
Sodium: 142 mmol/L (ref 135–145)

## 2015-11-12 LAB — CBC
HCT: 26 % — ABNORMAL LOW (ref 36.0–46.0)
Hemoglobin: 8 g/dL — ABNORMAL LOW (ref 12.0–15.0)
MCH: 26.2 pg (ref 26.0–34.0)
MCHC: 30.8 g/dL (ref 30.0–36.0)
MCV: 85.2 fL (ref 78.0–100.0)
PLATELETS: 208 10*3/uL (ref 150–400)
RBC: 3.05 MIL/uL — AB (ref 3.87–5.11)
RDW: 15.5 % (ref 11.5–15.5)
WBC: 9.5 10*3/uL (ref 4.0–10.5)

## 2015-11-12 LAB — CULTURE, BLOOD (ROUTINE X 2): CULTURE: NO GROWTH

## 2015-11-12 MED ORDER — IOHEXOL 300 MG/ML  SOLN
50.0000 mL | Freq: Once | INTRAMUSCULAR | Status: AC | PRN
  Administered 2015-11-12: 50 mL via ORAL

## 2015-11-12 MED ORDER — IOHEXOL 300 MG/ML  SOLN
100.0000 mL | Freq: Once | INTRAMUSCULAR | Status: AC | PRN
  Administered 2015-11-12: 100 mL via INTRAVENOUS

## 2015-11-12 NOTE — Progress Notes (Signed)
     [facility] Progress Note  Subjective:  S/P colon/egd 11/11/15. EGD:  Total Procedure Duration: Findings:  A large, polypoid mass with oozing bleeding and stigmata of recent   bleeding was found on the anterior wall of the stomach. Biopsies were   taken with a cold forceps for histology.  The exam was otherwise without abnormality. 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 Biopsied.  - The examination was otherwise normal. Moderate Sedation:  Per Anesthesia Care Recommendation: - Return patient to hospital ward for ongoing care.  - Resume regular diet.  - Continue present medications.  - Await pathology results.  - Discontinue Coumadin (warfarin) until full treatment   plans known. Colonoscopy:ndings:  The perianal exam was abnormal.  The digital rectal exam findings include decreased sphincter tone.  The colon (entire examined portion) appeared normal. Impression: - Abnormal perianal exam.  - Decreased sphincter tone found on digital rectal exam.  - The entire examined colon is normal.  - No specimens collected Pathology pending. Hgb 8.0. Objective:  Vital signs in last 24 hours: Temp:  [98.6 F (37 C)-99.3 F (37.4 C)] 99.2 F (37.3 C) (03/08 0441) Pulse Rate:  [95-111] 95 (03/08 0441) Resp:  [17-25] 20 (03/08 0441) BP: (128-180)/(63-112) 128/72 mmHg (03/08 0441) SpO2:  [92 %-100 %] 94 % (03/08 0441) Weight:  [145 lb 7 oz (65.97 kg)] 145 lb 7 oz (65.97 kg) (03/07 1349) Last BM Date: 11/12/15 General:   Alert,  Well-developed,    in NAD Heart:  irreg Pulm;lungs clear Abdomen:  Soft, nontender and nondistended. Normal bowel sounds, without  guarding, and without rebound.   Extremities:  Without edema. .  Lab Results:  CBC Latest Ref Rng 11/12/2015 11/11/2015 11/10/2015  WBC 4.0 - 10.5 K/uL 9.5 10.7(H) 11.3(H)  Hemoglobin 12.0 - 15.0 g/dL 8.0(L) 8.7(L) 8.3(L)  Hematocrit 36.0 - 46.0 % 26.0(L) 28.1(L) 26.8(L)  Platelets 150 - 400 K/uL 208 234 202      PT/INR  Recent Labs  11/10/15 0511 11/11/15 0439  LABPROT 18.1* 15.3*  INR 1.49 1.20     ASSESSMENT/PLAN:  82 year old female with a history of atrial fibrillation on chronic anticoagulation, admitted due to dyspnea, found to have pneumonia and an iron deficiency anemia with heme positive stools. Colon/EGD 11/11/15 with findings noted above. Awaiting path report. Continue present rx.     LOS: 5 days   Hvozdovic, Vita Barley PA-C 11/12/2015, Pager [phone] Mon-Fri 8a-5p [phone] after 5p, weekends, holidays     [facility] Attending   I have taken an interval history, reviewed the chart and examined the patient. I agree with the Advanced Practitioner's note, impression and recommendations.   Biopsies of gastric mass suspicious for adenoCa It is very ulcerated and a bleeding source so needs to stay off anti-coag Tx for now - if anything used should be short acting - heparinn gtt likely  I am ordering CT chest/abd/pelvis and will see what that shows. Could need EUS if these negative - though could also need surgery to eliminate bleeding problems regardless.  Tomorrow need GSU and oncology consults.  Patient and daughters understand plan  [name], MD, [facility] [phone] (pager) [phone] after 5 PM, weekends and holidays  11/12/2015 4:52 PM

## 2015-11-12 NOTE — Progress Notes (Signed)
[facility]                                                                              Patient Demographics  [name], is a 82 y.o. female, DOB - 11/13/1933, [id number]  Admit date - 11/07/2015   Admitting Physician Theodis Blaze, MD  Outpatient Primary MD for the patient is [name], MD  LOS - 5   Chief Complaint  Patient presents with  . Fever      HPI on 11/07/2015 by Dr. Mart Piggs Pt is 82 yo female with multiple medical issues including PAF on chronic AC, HTN, Hypothyroidism, presented to [facility] ED with main concern of several days duration of progressively worsening dyspnea that initially started with exertion and has progressed to dyspnea at rest in the past 24 hours, associated with subjective fevers, chills, cough productive of yellow sputum, chest tightness that occurs with coughing spells, malaise, poor oral intake, wheezing, difficulty with ambulation due to dyspnea. She denies known sick contacts or exposures, no abd or urinary concerns, daughter called EMS and oxygen saturation noted to be 84% on RA.  In ED, pt noted to be hemodynamically stable but in mild distress due to dyspnea, VS notable for T 102 F, HR up to 127, RR > 22, WBC 15 K and lactic acid 2.13. CXR with no clear signs of infectious etiology. Pt started Rocephin and Zithromax and TRH asked to admit for further evaluation.   Assessment & Plan   Sepsis secondary to Strep viridans bactermia/Pneumonia -Upon admission, patient was febrile with leukocytosis, tachycardia and tachypnea and elevated lactic acid -Chest x-ray showed no active cardio pulmonary disease -Urine culture showed multiple species -Blood cultures from 11/07/2015: 1/2 strep viridans -Repeat cultures 11/10/2015 negative -Initially placed on azithromycin and ceftriaxone, transitioned to Zosyn  -leukocytosis resolved -Strep pneumonia and legionella urine antigens negative  Iron deficiency anemia/GI bleed/ Gastric  mass -FOBT + -Gastroenterology consultation appreciated -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, biopsies pending -Colonoscopy: colon normal -GI recommended holding Coumadin  Right knee pain -s/p Right TKA -Continue pain control  Atrial fibrillation -CHADSVASC 4 (age, gender, HTN) -Continue atenolol -Coumadin held due to GI bleed  Hypothyroidism -Continue Synthroid  Hypertension -Continue atenolol  CKD, stage III -stable, continue to monitor BMP  Code Status: Full  Family Communication: Daughter at bedside  Disposition Plan: Admitted. Pending results of biopsy  Time Spent in minutes   30 minutes  Procedures  EGD Colonoscopy   Consults   Gastroenterology   DVT Prophylaxis  SCDs  Lab Results  Component Value Date   PLT 208 11/12/2015    Medications  Scheduled Meds: . atenolol  100 mg Oral BID  . Darbepoetin Alfa  25 mcg Subcutaneous Q7 days  . guaiFENesin  1,200 mg Oral BID  . levothyroxine  100 mcg Oral QAC breakfast  . pantoprazole (PROTONIX) IV  40 mg Intravenous Q12H  . piperacillin-tazobactam  3.375 g Intravenous Q8H  . potassium chloride SA  20 mEq Oral BID  . pregabalin  75 mg Oral BID  . sertraline  50 mg Oral Daily  . triamcinolone cream   Topical TID  . vitamin B-12  1,000 mcg Oral  Daily   Continuous Infusions:  PRN Meds:.ALPRAZolam, guaiFENesin-dextromethorphan, levalbuterol, nitroGLYCERIN, ondansetron **OR** ondansetron (ZOFRAN) IV, traMADol  Antibiotics    Anti-infectives    Start     Dose/Rate Route Frequency Ordered Stop   11/09/15 0600  vancomycin (VANCOCIN) 500 mg in sodium chloride 0.9 % 100 mL IVPB  Status:  Discontinued     500 mg 100 mL/hr over 60 Minutes Intravenous Every 12 hours 11/08/15 1538 11/11/15 2117   11/08/15 1600  piperacillin-tazobactam (ZOSYN) IVPB 3.375 g     3.375 g 12.5 mL/hr over 240 Minutes Intravenous Every 8 hours 11/08/15 1431     11/08/15 1600  vancomycin (VANCOCIN) IVPB 1000 mg/200 mL premix      1,000 mg 200 mL/hr over 60 Minutes Intravenous  Once 11/08/15 1536 11/08/15 1710   11/08/15 1400  azithromycin (ZITHROMAX) 500 mg in dextrose 5 % 250 mL IVPB  Status:  Discontinued     500 mg 250 mL/hr over 60 Minutes Intravenous Every 24 hours 11/07/15 1714 11/11/15 2131   11/08/15 1200  cefTRIAXone (ROCEPHIN) 2 g in dextrose 5 % 50 mL IVPB  Status:  Discontinued     2 g 100 mL/hr over 30 Minutes Intravenous Every 24 hours 11/07/15 1714 11/08/15 1431   11/07/15 1715  cefTRIAXone (ROCEPHIN) 1 g in dextrose 5 % 50 mL IVPB  Status:  Discontinued     1 g 100 mL/hr over 30 Minutes Intravenous  Once 11/07/15 1701 11/07/15 1715   11/07/15 1715  azithromycin (ZITHROMAX) 500 mg in dextrose 5 % 250 mL IVPB  Status:  Discontinued     500 mg 250 mL/hr over 60 Minutes Intravenous  Once 11/07/15 1701 11/07/15 1715   11/07/15 1330  azithromycin (ZITHROMAX) 500 mg in dextrose 5 % 250 mL IVPB     500 mg 250 mL/hr over 60 Minutes Intravenous  Once 11/07/15 1321 11/07/15 1530   11/07/15 1330  cefTRIAXone (ROCEPHIN) 1 g in dextrose 5 % 50 mL IVPB     1 g 100 mL/hr over 30 Minutes Intravenous  Once 11/07/15 1321 11/07/15 1421      Subjective:   Valrie [name] seen and examined today.  Patient denies any chest pain, shortness of breath, abdominal pain, nausea or vomiting. Denies any diarrhea or constipation at this time.   Objective:   Filed Vitals:   11/11/15 1532 11/11/15 1554 11/11/15 2114 11/12/15 0441  BP: 176/89 157/93 138/63 128/72  Pulse: 107 105 103 95  Temp:  98.6 F (37 C) 98.8 F (37.1 C) 99.2 F (37.3 C)  TempSrc:  Oral Oral Oral  Resp: 25 22 20 20   Height:      Weight:      SpO2: 100% 98% 96% 94%    Wt Readings from Last 3 Encounters:  11/11/15 65.97 kg (145 lb 7 oz)  10/14/15 68.221 kg (150 lb 6.4 oz)  10/09/15 67.586 kg (149 lb)     Intake/Output Summary (Last 24 hours) at 11/12/15 1247 Last data filed at 11/12/15 0944  Gross per 24 hour  Intake    790 ml  Output       0 ml  Net    790 ml    Exam  General: Well developed, well nourished, NAD, appears stated age  HEENT: NCAT, mucous membranes moist.   Cardiovascular: S1 S2 auscultated, IRR  Respiratory: Clear to auscultation bilaterally with equal chest rise  Abdomen: Soft, nontender, nondistended, + bowel sounds  Extremities: warm dry without cyanosis clubbing or edema  Neuro: AAOx3, nonfocal  Skin: Without rashes exudates or nodules  Psych: Normal affect and demeanor     Data Review   Micro Results Recent Results (from the past 240 hour(s))  Culture, blood (routine x 2)     Status: None (Preliminary result)   Collection Time: 11/07/15 12:28 PM  Result Value Ref Range Status   Specimen Description BLOOD RIGHT ANTECUBITAL  Final   Special Requests IN PEDIATRIC BOTTLE 4ML  Final   Culture   Final    NO GROWTH 4 DAYS Performed at [facility]    Report Status PENDING  Incomplete  Culture, blood (routine x 2)     Status: None   Collection Time: 11/07/15 12:29 PM  Result Value Ref Range Status   Specimen Description BLOOD LEFT HAND  Final   Special Requests IN PEDIATRIC BOTTLE 2ML  Final   Culture  Setup Time   Final    GRAM POSITIVE COCCI IN CHAINS IN PEDIATRIC BOTTLE CRITICAL RESULT CALLED TO, READ BACK BY AND VERIFIED WITH: C ADDISON,RN @0449  11/08/15 MKELLY    Culture   Final    VIRIDANS STREPTOCOCCUS THE SIGNIFICANCE OF ISOLATING THIS ORGANISM FROM A SINGLE SET OF BLOOD CULTURES WHEN MULTIPLE SETS ARE DRAWN IS UNCERTAIN. PLEASE NOTIFY THE MICROBIOLOGY DEPARTMENT WITHIN ONE WEEK IF SPECIATION AND SENSITIVITIES ARE REQUIRED. Performed at [facility]    Report Status 11/11/2015 FINAL  Final  Urine culture     Status: None   Collection Time: 11/07/15  4:35 PM  Result Value Ref Range Status   Specimen Description URINE, CLEAN CATCH  Final   Special Requests NONE  Final   Culture   Final    MULTIPLE SPECIES PRESENT, SUGGEST RECOLLECTION Performed at [facility]    Report Status 11/09/2015 FINAL  Final  Culture, sputum-assessment     Status: None   Collection Time: 11/07/15  8:42 PM  Result Value Ref Range Status   Specimen Description SPUTUM  Final   Special Requests NONE  Final   Sputum evaluation   Final    THIS SPECIMEN IS ACCEPTABLE. RESPIRATORY CULTURE REPORT TO FOLLOW.   Report Status 11/07/2015 FINAL  Final  Culture, respiratory (NON-Expectorated)     Status: None   Collection Time: 11/07/15  8:42 PM  Result Value Ref Range Status   Specimen Description SPUTUM  Final   Special Requests NONE  Final   Gram Stain   Final    ABUNDANT WBC PRESENT, PREDOMINANTLY PMN FEW SQUAMOUS EPITHELIAL CELLS PRESENT MODERATE GRAM POSITIVE COCCI IN PAIRS IN CLUSTERS FEW GRAM POSITIVE RODS THIS SPECIMEN IS ACCEPTABLE FOR SPUTUM CULTURE Performed at [facility]    Culture   Final    NORMAL OROPHARYNGEAL FLORA Performed at [facility]    Report Status 11/10/2015 FINAL  Final  Culture, blood (routine x 2)     Status: None (Preliminary result)   Collection Time: 11/10/15  5:10 AM  Result Value Ref Range Status   Specimen Description BLOOD LEFT ARM  Final   Special Requests IN PEDIATRIC BOTTLE 3ML  Final   Culture   Final    NO GROWTH 1 DAY Performed at [facility]    Report Status PENDING  Incomplete  Culture, blood (routine x 2)     Status: None (Preliminary result)   Collection Time: 11/10/15  8:00 AM  Result Value Ref Range Status   Specimen Description BLOOD LEFT ARM  Final   Special Requests BOTTLES  DRAWN AEROBIC AND ANAEROBIC 6CC  Final   Culture   Final    NO GROWTH 1 DAY Performed at [facility]    Report Status PENDING  Incomplete    Radiology Reports Dg Chest 2 View  11/07/2015  CLINICAL DATA:  Productive cough with green sputum, shortness of breath and fever for 5 days. EXAM: CHEST  2 VIEW COMPARISON:  08/02/2014 FINDINGS: Stable appearance of the right dual chamber cardiac  pacemaker. Heart size is within normal limits. Aortic arch is calcified. Lungs are clear without airspace disease or pulmonary edema. No acute bone abnormality. No pleural effusions. IMPRESSION: No active cardiopulmonary disease. Electronically Signed   By: Markus Daft M.D.   On: 11/07/2015 13:13   Dg Knee 1-2 Views Right  11/08/2015  CLINICAL DATA:  Pain for 2 days. No recent trauma. Total knee replacement 3 years ago. EXAM: RIGHT KNEE - 1-2 VIEW COMPARISON:  None. FINDINGS: The patient is status post knee replacement. Hardware is in good position with no loosening or failure identified. Surgical screws and metallic tacks project over the proximal tibia, also in good position. No joint effusion. Mild anterior soft tissue swelling is seen. No bony erosion or bony lesions. No fractures are seen. IMPRESSION: Mild anterior soft tissue swelling.  No other abnormalities. Electronically Signed   By: Dorise Bullion III M.D   On: 11/08/2015 16:00   Ct Chest Wo Contrast  11/07/2015  CLINICAL DATA:  Lethargy, fever, URI symptoms, increased coughing, shortness of breath,, weakness, fever and chills today, hypoxemia with 84% oxygen saturation on room air, history coronary artery disease, CHF, paroxysmal atrial fibrillation, hypertension, hyperlipidemia EXAM: CT CHEST WITHOUT CONTRAST TECHNIQUE: Multidetector CT imaging of the chest was performed following the standard protocol without IV contrast. Sagittal and coronal MPR images reconstructed from axial data set. COMPARISON:  08/03/2014 FINDINGS: Beam hardening artifacts from pacemaker generator upper RIGHT chest. Extensive atherosclerotic calcifications aorta, proximal great vessels, and coronary arteries. Aneurysmal dilatation ascending thoracic aorta 4.1 cm transverse image 25. Minimally enlarged precarinal lymph node 11 mm short axis image 21. Additional scattered normal size thoracic nodes. Gallbladder surgically absent. Visualized upper abdomen otherwise unremarkable.  Central peribronchial thickening. Dependent atelectasis in the posterior lungs bilaterally. Tiny LEFT pleural effusion. Calcified granuloma RIGHT upper lobe image 22. No acute infiltrate, pleural effusion, or pneumothorax. Bones demineralized with scattered degenerative disc disease changes thoracic spine. IMPRESSION: Bronchitic changes with bibasilar atelectasis. Single nonspecific minimally enlarged precarinal lymph node. Extensive atherosclerotic calcification with aneurysmal dilatation of the ascending thoracic aorta; recommendation below. Recommend annual imaging followup by CTA or MRA. This recommendation follows 2010 ACCF/AHA/AATS/ACR/ASA/SCA/SCAI/SIR/STS/SVM Guidelines for the Diagnosis and Management of Patients with Thoracic Aortic Disease. Circulation. 2010; [id number] Electronically Signed   By: Lavonia Dana M.D.   On: 11/07/2015 19:41    CBC  Recent Labs Lab 11/07/15 1228 11/08/15 0554 11/09/15 0605 11/10/15 0511 11/11/15 0439 11/12/15 0514  WBC 15.0* 15.0* 12.8* 11.3* 10.7* 9.5  HGB 10.2* 8.2* 7.9* 8.3* 8.7* 8.0*  HCT 32.7* 26.0* 25.6* 26.8* 28.1* 26.0*  PLT 238 196 179 202 234 208  MCV 86.7 86.7 86.8 86.7 86.2 85.2  MCH 27.1 27.3 26.8 26.9 26.7 26.2  MCHC 31.2 31.5 30.9 31.0 31.0 30.8  RDW 14.6 15.0 15.6* 15.5 15.5 15.5  LYMPHSABS 0.7  --  1.6  --   --   --   MONOABS 0.7  --  1.3*  --   --   --   EOSABS 0.0  --  0.1  --   --   --   BASOSABS 0.0  --  0.0  --   --   --     Chemistries   Recent Labs Lab 11/07/15 1228 11/07/15 1740 11/08/15 0554 11/09/15 0605 11/10/15 0511 11/11/15 0439 11/12/15 0514  NA 139  --  143 140 136 140 142  K 3.8  --  3.6 3.9 3.8 4.1 3.8  CL 114*  --  117* 116* 112* 114* 113*  CO2 18*  --  19* 17* 17* 17* 18*  GLUCOSE 140*  --  103* 95 120* 103* 95  BUN 19  --  13 9 11 9 10   CREATININE 1.09*  --  0.87 0.84 0.96 0.92 1.04*  CALCIUM 8.1*  --  8.2* 8.3* 8.3* 8.9 8.7*  MG  --  1.5*  --   --   --   --   --   AST 18  --   --  18 20  --    --   ALT 12*  --   --  11* 12*  --   --   ALKPHOS 107  --   --  72 75  --   --   BILITOT 0.4  --   --  0.7 0.5  --   --    ------------------------------------------------------------------------------------------------------------------ estimated creatinine clearance is 38.2 mL/min (by C-G formula based on Cr of 1.04). ------------------------------------------------------------------------------------------------------------------ No results for input(s): HGBA1C in the last 72 hours. ------------------------------------------------------------------------------------------------------------------ No results for input(s): CHOL, HDL, LDLCALC, TRIG, CHOLHDL, LDLDIRECT in the last 72 hours. ------------------------------------------------------------------------------------------------------------------ No results for input(s): TSH, T4TOTAL, T3FREE, THYROIDAB in the last 72 hours.  Invalid input(s): FREET3 ------------------------------------------------------------------------------------------------------------------ No results for input(s): VITAMINB12, FOLATE, FERRITIN, TIBC, IRON, RETICCTPCT in the last 72 hours.  Coagulation profile  Recent Labs Lab 11/07/15 1740 11/08/15 0554 11/09/15 0605 11/10/15 0511 11/11/15 0439  INR 2.31* 2.48* 2.52* 1.49 1.20    No results for input(s): DDIMER in the last 72 hours.  Cardiac Enzymes No results for input(s): CKMB, TROPONINI, MYOGLOBIN in the last 168 hours.  Invalid input(s): CK ------------------------------------------------------------------------------------------------------------------ Invalid input(s): POCBNP    Lorian Yaun D.O. on 11/12/2015 at 12:47 PM  Between 7am to 7pm - Pager - [phone]  After 7pm go to [email or web address] - password [email or web address]  And look for the night coverage person covering for me after hours  [facility] Office  [phone]

## 2015-11-12 NOTE — Progress Notes (Signed)
Physical Therapy Treatment Patient Details Name: [name] MRN: [id number] DOB: 09/04/1934 Today's Date: 11/12/2015    History of Present Illness 82 yo female admitted with sepsis. Hx of PAF, CAD, tachy-brady syndrome, aortic stenosis, neuropathy, RLS, uterine cancer, osteomyelitis, syncope, CKD, TKA, R patella tendon repair 07/2014    PT Comments    Pt agreeable to ambulation. Walked ~75 feet with RW. Dyspnea 3/4, HR 110s-120s with activity. Pt fatigues quickly/easily. Encouraged her to continue using RW for ambulation. VCs for pursed lip/deep breathing during session.   Follow Up Recommendations  Home health PT;Supervision/Assistance - 24 hour     Equipment Recommendations  None recommended by PT    Recommendations for Other Services       Precautions / Restrictions Precautions Precautions: Fall Precaution Comments: monitor sats, HR Restrictions Weight Bearing Restrictions: No    Mobility  Bed Mobility Overal bed mobility: Needs Assistance Bed Mobility: Supine to Sit;Sit to Supine     Supine to sit: Min guard;HOB elevated Sit to supine: Min guard;HOB elevated   General bed mobility comments: Effortful for pt. Increased time.   Transfers Overall transfer level: Needs assistance Equipment used: Rolling walker (2 wheeled) Transfers: Sit to/from Stand Sit to Stand: Min guard         General transfer comment: close guard for safety.   Ambulation/Gait Ambulation/Gait assistance: Min guard Ambulation Distance (Feet): 75 Feet Assistive device: Rolling walker (2 wheeled) Gait Pattern/deviations: Step-through pattern;Decreased stride length;Trunk flexed     General Gait Details: close guard for safety. Pt fatigues fairly quickly/easily. O2 sats >90% on RA during ambulation. Dyspnea 3/4. HR 110s-120s.    Stairs            Wheelchair Mobility    Modified Rankin (Stroke Patients Only)       Balance                                     Cognition Arousal/Alertness: Awake/alert Behavior During Therapy: WFL for tasks assessed/performed Overall Cognitive Status: Within Functional Limits for tasks assessed                      Exercises General Exercises - Lower Extremity Ankle Circles/Pumps: AROM;Both;10 reps;Seated Long Arc Quad: AROM;Both;10 reps;Seated    General Comments        Pertinent Vitals/Pain Pain Assessment: No/denies pain    Home Living                      Prior Function            PT Goals (current goals can now be found in the care plan section) Progress towards PT goals: Progressing toward goals    Frequency  Min 3X/week    PT Plan Current plan remains appropriate    Co-evaluation             End of Session Equipment Utilized During Treatment: Gait belt Activity Tolerance: Patient limited by fatigue Patient left: in bed;with call bell/phone within reach;with family/visitor present     Time: [id number] PT Time Calculation (min) (ACUTE ONLY): 16 min  Charges:  $Gait Training: 8-22 mins                    G Codes:      Weston Anna, MPT Pager: [phone]

## 2015-11-12 NOTE — Progress Notes (Signed)
Resumed care of patient. Agree with previous assessment. Will continue to monitor. 

## 2015-11-13 ENCOUNTER — Encounter (HOSPITAL_COMMUNITY): Payer: Self-pay

## 2015-11-13 DIAGNOSIS — D631 Anemia in chronic kidney disease: Secondary | ICD-10-CM

## 2015-11-13 DIAGNOSIS — I2510 Atherosclerotic heart disease of native coronary artery without angina pectoris: Secondary | ICD-10-CM

## 2015-11-13 DIAGNOSIS — N189 Chronic kidney disease, unspecified: Secondary | ICD-10-CM

## 2015-11-13 DIAGNOSIS — Z950 Presence of cardiac pacemaker: Secondary | ICD-10-CM

## 2015-11-13 DIAGNOSIS — E538 Deficiency of other specified B group vitamins: Secondary | ICD-10-CM

## 2015-11-13 DIAGNOSIS — J189 Pneumonia, unspecified organism: Secondary | ICD-10-CM

## 2015-11-13 DIAGNOSIS — N390 Urinary tract infection, site not specified: Secondary | ICD-10-CM

## 2015-11-13 DIAGNOSIS — D509 Iron deficiency anemia, unspecified: Secondary | ICD-10-CM

## 2015-11-13 DIAGNOSIS — C169 Malignant neoplasm of stomach, unspecified: Secondary | ICD-10-CM

## 2015-11-13 DIAGNOSIS — A419 Sepsis, unspecified organism: Secondary | ICD-10-CM

## 2015-11-13 DIAGNOSIS — Z531 Procedure and treatment not carried out because of patient's decision for reasons of belief and group pressure: Secondary | ICD-10-CM | POA: Insufficient documentation

## 2015-11-13 DIAGNOSIS — Z8542 Personal history of malignant neoplasm of other parts of uterus: Secondary | ICD-10-CM

## 2015-11-13 DIAGNOSIS — E039 Hypothyroidism, unspecified: Secondary | ICD-10-CM

## 2015-11-13 DIAGNOSIS — C649 Malignant neoplasm of unspecified kidney, except renal pelvis: Secondary | ICD-10-CM

## 2015-11-13 DIAGNOSIS — A499 Bacterial infection, unspecified: Secondary | ICD-10-CM

## 2015-11-13 DIAGNOSIS — Z905 Acquired absence of kidney: Secondary | ICD-10-CM

## 2015-11-13 DIAGNOSIS — I480 Paroxysmal atrial fibrillation: Secondary | ICD-10-CM

## 2015-11-13 DIAGNOSIS — Z96659 Presence of unspecified artificial knee joint: Secondary | ICD-10-CM

## 2015-11-13 LAB — BASIC METABOLIC PANEL
Anion gap: 9 (ref 5–15)
BUN: 11 mg/dL (ref 6–20)
CHLORIDE: 111 mmol/L (ref 101–111)
CO2: 18 mmol/L — AB (ref 22–32)
Calcium: 8.2 mg/dL — ABNORMAL LOW (ref 8.9–10.3)
Creatinine, Ser: 1.01 mg/dL — ABNORMAL HIGH (ref 0.44–1.00)
GFR calc Af Amer: 60 mL/min — ABNORMAL LOW (ref >60)
GFR calc non Af Amer: 52 mL/min — ABNORMAL LOW (ref >60)
GLUCOSE: 125 mg/dL — AB (ref 65–99)
POTASSIUM: 3.6 mmol/L (ref 3.5–5.1)
Sodium: 138 mmol/L (ref 135–145)

## 2015-11-13 LAB — CBC
HCT: 25.7 % — ABNORMAL LOW (ref 36.0–46.0)
HEMOGLOBIN: 7.9 g/dL — AB (ref 12.0–15.0)
MCH: 26.7 pg (ref 26.0–34.0)
MCHC: 30.7 g/dL (ref 30.0–36.0)
MCV: 86.8 fL (ref 78.0–100.0)
PLATELETS: 231 10*3/uL (ref 150–400)
RBC: 2.96 MIL/uL — AB (ref 3.87–5.11)
RDW: 15.9 % — ABNORMAL HIGH (ref 11.5–15.5)
WBC: 8.3 10*3/uL (ref 4.0–10.5)

## 2015-11-13 MED ORDER — FUROSEMIDE 10 MG/ML IJ SOLN
20.0000 mg | Freq: Once | INTRAMUSCULAR | Status: AC
  Administered 2015-11-13: 20 mg via INTRAVENOUS
  Filled 2015-11-13: qty 2

## 2015-11-13 MED ORDER — B COMPLEX-C PO TABS
1.0000 | ORAL_TABLET | Freq: Every day | ORAL | Status: DC
  Administered 2015-11-13 – 2015-11-15 (×3): 1 via ORAL
  Filled 2015-11-13 (×3): qty 1

## 2015-11-13 MED ORDER — DARBEPOETIN ALFA 200 MCG/0.4ML IJ SOSY
200.0000 ug | PREFILLED_SYRINGE | INTRAMUSCULAR | Status: DC
  Administered 2015-11-14: 200 ug via SUBCUTANEOUS
  Filled 2015-11-13: qty 0.4
  Filled 2015-11-13: qty 3.36

## 2015-11-13 MED ORDER — FERUMOXYTOL INJECTION 510 MG/17 ML
510.0000 mg | Freq: Once | INTRAVENOUS | Status: AC
  Administered 2015-11-13: 510 mg via INTRAVENOUS
  Filled 2015-11-13: qty 17

## 2015-11-13 MED ORDER — CYANOCOBALAMIN 1000 MCG/ML IJ SOLN
1000.0000 ug | Freq: Every day | INTRAMUSCULAR | Status: DC
  Administered 2015-11-13 – 2015-11-15 (×3): 1000 ug via SUBCUTANEOUS
  Filled 2015-11-13 (×3): qty 1

## 2015-11-13 NOTE — Consult Note (Signed)
Reason for Consult: Gastric Mass PCP:  [name], MD GI: Dr. Silvano Rusk  Referring Physician: DR. Adonis Housekeeper is an 82 y.o. female.   HPI: Pt with multiple medical issues noted below admitted on 11/07/15 with dyspnea, fever, sepsis, with suspected pneumonia.  She was seen on 11/10/15 by GI, with anemia and heme positive stools. She and her daughters report she started feeling bad around Sept 2016.  This included a burning pain that occurred more when she had not eaten for a time.  Pain got better with PO intake, even crackers. She would have some nausea with her pain, it also resolved after eating.  She also notes she fill quicker than before.  She has only lost 2-5 pounds over the last few weeks, but attributes it to monitoring her PO intake.   She underwent  EGD on 11/11/15 and this showed:  A large, polypoid mass with oozing bleeding and stigmata of recent    bleeding was found on the anterior wall of the stomach. Biopsies were taken with a cold forceps for histology. The exam was otherwise without abnormality.   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 Biopsied.  - The examination was otherwise normal. Colonoscopy same date:  - Abnormal perianal exam - Decreased sphincter tone found on digital rectal exam - The entire examined colon is normal.  No specimens collected Pathology show:   Stomach, biopsy - MICROSCOPIC ATYPICAL GLANDULAR FRAGMENT HIGHLY SUSPICIOUS COR ADENOCARCINOMA ASSOCIATED WITH  ULCER.  SEE MICROSCOPIC DESCRIPTION. CT scans of the chest, abdomen and pelvis shows:  1. Progressive bilateral pleural effusions since exam 5 days prior, now moderate in degree. There is adjacent compressive atelectasis in the lower lobes.  Minimal increased size in the prominent pretracheal lymph node, nonspecific, increased size likely reactive.  Irregular gastric wall thickening involving the fundus, concerning for malignancy. No  definite perigastric adenopathy. No evidence of metastatic disease in the abdomen and pelvis. Post left nephrectomy, cholecystectomy, and hysterectomy.  We are ask to see.  Past Medical History  Diagnosis Date  . PAF (paroxysmal atrial fibrillation) ([place])     controlled with amiodarone, on coumadin  . CAD (coronary artery disease)   . Aortic root dilatation ([place])   . Moderate aortic stenosis   . Chronic anticoagulation   . Tachycardia-bradycardia syndrome ([place])     s/p PPM by Dr Doreatha Lew ([facility]) 07/03/10  . GERD (gastroesophageal reflux disease)   . Pacemaker     2012  . Neuropathy ([place])     feet/legs  . Restless leg syndrome   . Headache(784.0)     hx migraines - takes Zoloft for migraines  . History of uterine cancer 1980s    treated with hysterectomy, external radiation and radiation seed implants.   . Cancer ([place])     hx Kidney cancer / hx of endometrial cancer  . PONV (postoperative nausea and vomiting)   . Anxiety   . Heart murmur   . HTN (hypertension)   . Hyperlipemia   . Hypothyroidism   . Osteomyelitis ([place]) as child  . Family history of anesthesia complication     daughter has ponv  . Syncope 05/28/2014  . Hepatic steatosis   . Elevated LFTs   . Gastroparesis   . Anal fissure   . Radiation proctitis   . Gastric polyp     adenomatous  . Neuromuscular disorder ([facility])     neuropathy in right leg  .  Chronic kidney disease     only has one kidney   . 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 Cardiac catheterization  2008  . Nephrectomy Left     renal cell cancer  . Total abdominal hysterectomy    . Patent ductus arterious repair  age 56  . Ptvp    . Pacemaker insertion  07/04/11    by Dr Doreatha Lew  . Cholecystectomy  1970s  . Upper gastrointestinal endoscopy    . Total knee arthroplasty  02/07/2012    Procedure: TOTAL KNEE ARTHROPLASTY;  Surgeon: Mauri Pole, MD;   Location: [facility];  Service: Orthopedics;  Laterality: Right;  . Insert / replace / remove pacemaker    . Total hip revision Right 2009  . I&d knee with poly exchange Right 12/17/2013    Procedure: IRRIGATION AND DEBRIDEMEN RIGHT TOTAL  KNEE WITH POLY EXCHANGE;  Surgeon: Mauri Pole, MD;  Location: [facility];  Service: Orthopedics;  Laterality: Right;  . Patellar tendon repair Right 03/06/2014    Procedure: AVULSION WITH PATELLA TENDON REPAIR;  Surgeon: Mauri Pole, MD;  Location: [facility];  Service: Orthopedics;  Laterality: Right;  . Total knee revision Right 07/29/2014    Procedure: RIGHT KNEE REVISION OF PREVIOUS REPAIR EXTENSOR MECHANISM;  Surgeon: Mauri Pole, MD;  Location: [facility];  Service: Orthopedics;  Laterality: Right;  . Colonoscopy N/A 11/11/2015    Procedure: COLONOSCOPY;  Surgeon: [name], MD;  Location: [facility];  Service: Endoscopy;  Laterality: N/A;  . Esophagogastroduodenoscopy N/A 11/11/2015    Procedure: ESOPHAGOGASTRODUODENOSCOPY (EGD);  Surgeon: [name], MD;  Location: [facility];  Service: Endoscopy;  Laterality: N/A;    Family History  Problem Relation Age of Onset  . Colon cancer Mother 58  . Heart disease Mother   . Heart disease Father   . Heart attack Father   . Heart disease Maternal Grandmother     Social History:  reports that she has never smoked. She has never used smokeless tobacco. She reports that she does not drink alcohol or use illicit drugs.  Allergies:  Allergies  Allergen Reactions  . Amiodarone      Thyroid and liver and kidney problems  . Codeine Nausea And Vomiting  . Gabapentin Itching  . Hydrocodone Other (See Comments)    hallucination  . Morphine And Related Nausea And Vomiting  . Requip [Ropinirole Hcl]     Headache   . Zinc     nausea    Prior to Admission medications   Medication Sig Start Date End Date Taking? Authorizing Provider  ALPRAZolam Duanne Moron) 0.5 MG tablet TAKE 1/2 TO 1 TABLET BY MOUTH 3 TIMES  A DAY AS NEEDED FOR ANXIETY 06/04/15 12/02/15 Yes [name], MD  atenolol (TENORMIN) 100 MG tablet Take 1 tablet (100 mg total) by mouth 2 (two) times daily. 05/19/15  Yes Darlin Coco, MD  Biotin 5000 MCG CAPS Take 10,000 mcg by mouth every morning.    Yes Historical Provider, MD  Cholecalciferol (VITAMIN D PO) Take 5,000 Units by mouth every morning.    Yes Historical Provider, MD  furosemide (LASIX) 40 MG tablet Take 20 mg by mouth daily.    Yes Historical Provider, MD  levothyroxine (SYNTHROID, LEVOTHROID) 100 MCG tablet TAKE 1 TABLET BY MOUTH EVERY DAY ON EMPTY STOMACH WITH A FULL GLASS OF WATER 10/25/15  Yes [name], MD  losartan (COZAAR) 100 MG tablet TAKE 1 TABLET (100 MG TOTAL) BY MOUTH DAILY. 06/23/15  Yes Burtis Junes, NP  nitroGLYCERIN (NITROSTAT) 0.4 MG SL tablet Place 1 tablet (0.4 mg total) under the tongue every 5 (five) minutes as needed for chest pain (MAX 3 TABLETS). 05/19/15  Yes Darlin Coco, MD  potassium chloride SA (K-DUR,KLOR-CON) 20 MEQ tablet Take 20 mEq by mouth 2 (two) times daily.   Yes Historical Provider, MD  pregabalin (LYRICA) 75 MG capsule Take 1 capsule (75 mg total) by mouth 2 (two) times daily. 05/26/15  Yes [name], MD  ranitidine (ZANTAC) 300 MG tablet Take 300 mg by mouth daily. Take 1-2 tablets by mouth daily as needed for heartburn or acid reflux   Yes Historical Provider, MD  sertraline (ZOLOFT) 50 MG tablet Take 1 tablet (50 mg total) by mouth daily. For mood 09/23/15  Yes [name], MD  traMADol (ULTRAM) 50 MG tablet Take 50 mg by mouth every 6 (six) hours as needed (pain). Reported on 10/14/2015   Yes Historical Provider, MD  triamcinolone cream (KENALOG) 0.1 % APPLY TO AFFECTED AREA TWICE A DAY 04/09/15  Yes [name], MD  warfarin (COUMADIN) 2 MG tablet Take as directed Patient taking differently: Take 2-3 mg by mouth daily. Take as directed: Take 3 mg on MWF and Take 2 mg on remaining days. 06/04/15 12/02/15 Yes [name], MD     Results for orders placed or performed during the hospital encounter of 11/07/15 (from the past 48 hour(s))  CBC     Status: Abnormal   Collection Time: 11/12/15  5:14 AM  Result Value Ref Range   WBC 9.5 4.0 - 10.5 K/uL   RBC 3.05 (L) 3.87 - 5.11 MIL/uL   Hemoglobin 8.0 (L) 12.0 - 15.0 g/dL   HCT 26.0 (L) 36.0 - 46.0 %   MCV 85.2 78.0 - 100.0 fL   MCH 26.2 26.0 - 34.0 pg   MCHC 30.8 30.0 - 36.0 g/dL   RDW 15.5 11.5 - 15.5 %   Platelets 208 150 - 400 K/uL  Basic metabolic panel     Status: Abnormal   Collection Time: 11/12/15  5:14 AM  Result Value Ref Range   Sodium 142 135 - 145 mmol/L   Potassium 3.8 3.5 - 5.1 mmol/L   Chloride 113 (H) 101 - 111 mmol/L   CO2 18 (L) 22 - 32 mmol/L   Glucose, Bld 95 65 - 99 mg/dL   BUN 10 6 - 20 mg/dL   Creatinine, Ser 1.04 (H) 0.44 - 1.00 mg/dL   Calcium 8.7 (L) 8.9 - 10.3 mg/dL   GFR calc non Af Amer 50 (L) >60 mL/min   GFR calc Af Amer 58 (L) >60 mL/min    Comment: (NOTE) The eGFR has been calculated using the CKD EPI equation. This calculation has not been validated in all clinical situations. eGFR's persistently <60 mL/min signify possible Chronic Kidney Disease.    Anion gap 11 5 - 15  CBC     Status: Abnormal   Collection Time: 11/13/15  4:45 AM  Result Value Ref Range   WBC 8.3 4.0 - 10.5 K/uL   RBC 2.96 (L) 3.87 - 5.11 MIL/uL   Hemoglobin 7.9 (L) 12.0 - 15.0 g/dL   HCT 25.7 (L) 36.0 - 46.0 %   MCV 86.8 78.0 - 100.0 fL   MCH 26.7 26.0 - 34.0 pg   MCHC 30.7 30.0 - 36.0 g/dL   RDW 15.9 (  H) 11.5 - 15.5 %   Platelets 231 150 - 400 K/uL  Basic metabolic panel     Status: Abnormal   Collection Time: 11/13/15  4:45 AM  Result Value Ref Range   Sodium 138 135 - 145 mmol/L   Potassium 3.6 3.5 - 5.1 mmol/L   Chloride 111 101 - 111 mmol/L   CO2 18 (L) 22 - 32 mmol/L   Glucose, Bld 125 (H) 65 - 99 mg/dL   BUN 11 6 - 20 mg/dL   Creatinine, Ser 1.01 (H) 0.44 - 1.00 mg/dL   Calcium 8.2 (L) 8.9 - 10.3 mg/dL   GFR  calc non Af Amer 52 (L) >60 mL/min   GFR calc Af Amer 60 (L) >60 mL/min    Comment: (NOTE) The eGFR has been calculated using the CKD EPI equation. This calculation has not been validated in all clinical situations. eGFR's persistently <60 mL/min signify possible Chronic Kidney Disease.    Anion gap 9 5 - 15    Ct Chest W Contrast  11/12/2015  CLINICAL DATA:  Progressively worsening dyspnea. Subjective fever, chills and cough. Malaise and decreased oral intake. Gastric mass on endoscopy. EXAM: CT CHEST, ABDOMEN, AND PELVIS WITH CONTRAST TECHNIQUE: Multidetector CT imaging of the chest, abdomen and pelvis was performed following the standard protocol during bolus administration of intravenous contrast. CONTRAST:  78m OMNIPAQUE IOHEXOL 300 MG/ML SOLN, 1094mOMNIPAQUE IOHEXOL 300 MG/ML SOLN COMPARISON:  Chest CT five days prior 11/07/2015. Most recent CT abdomen/ pelvis 05/31/2014 FINDINGS: CT CHEST FINDINGS Mediastinum/Lymph Nodes: Enlarged precarinal lymph node measures 1.5 cm, previously 1.1 cm. Additional small mediastinal and hilar lymph nodes are not enlarged by size criteria. Aneurysmal dilatation of the ascending aorta, 3.8 cm currently, differences likely secondary to caliper placement. No aortic dissection. Coronary artery calcifications are seen. Pacemaker in place. Mitral annulus calcifications. No pericardial effusion. Lungs/Pleura: Progression in bilateral pleural effusions, now moderate in degree. There is adjacent compressive atelectasis in both lower lobes. Calcified granuloma in the right lung is unchanged. Musculoskeletal: There are no acute or suspicious osseous abnormalities. Multilevel degenerative change in the thoracic spine. CT ABDOMEN PELVIS FINDINGS Hepatobiliary: No focal hepatic lesion. Postcholecystectomy with unchanged prominence of the common bile duct measuring 11 mm. No calcified choledocholithiasis. Pancreas: No ductal dilatation or inflammation. Spleen: Normal in size and  density, no focal abnormality. Adrenals/Urinary Tract: There is no adrenal nodule. Patient is post left nephrectomy. Homogeneous attenuation throughout the right kidney without hydronephrosis. Urinary bladder is minimally distended. Stomach/Bowel: Irregular wall thickening involving the gastric fundus. More distal stomach is decompressed. No definite perigastric lymphadenopathy. There are no dilated or thickened bowel loops. Contrast and stool throughout the colon without abnormal distension. No bowel obstruction, oral contrast reaches the rectum. Appendix is not visualized. Vascular/Lymphatic: Dense atherosclerosis of the abdominal aorta without aneurysm. Small retroperitoneal lymph nodes, not enlarged by size criteria. Reproductive: Uterus is surgically absent. No gross adnexal mass, detailed pelvic evaluation partially obscured by streak artifact from right hip prosthesis. Other: Whole body wall edema. There is mild presacral edema, chronic. Post bilateral pelvic lymph node dissection. No free intra-abdominal air or free fluid. Musculoskeletal: Right hip arthroplasty. There are no acute or suspicious osseous abnormalities. Multilevel degenerative change in the lumbar spine. IMPRESSION: 1. Progressive bilateral pleural effusions since exam 5 days prior, now moderate in degree. There is adjacent compressive atelectasis in the lower lobes. 2. Minimal increased size in the prominent pretracheal lymph node, nonspecific, increased size likely reactive. 3. Irregular gastric wall thickening  involving the fundus, concerning for malignancy. No definite perigastric adenopathy. No evidence of metastatic disease in the abdomen and pelvis. 4. Post left nephrectomy, cholecystectomy, and hysterectomy. Electronically Signed   By: Jeb Levering M.D.   On: 11/12/2015 23:55   Ct Abdomen Pelvis W Contrast  11/12/2015  CLINICAL DATA:  Progressively worsening dyspnea. Subjective fever, chills and cough. Malaise and decreased oral  intake. Gastric mass on endoscopy. EXAM: CT CHEST, ABDOMEN, AND PELVIS WITH CONTRAST TECHNIQUE: Multidetector CT imaging of the chest, abdomen and pelvis was performed following the standard protocol during bolus administration of intravenous contrast. CONTRAST:  73m OMNIPAQUE IOHEXOL 300 MG/ML SOLN, 1068mOMNIPAQUE IOHEXOL 300 MG/ML SOLN COMPARISON:  Chest CT five days prior 11/07/2015. Most recent CT abdomen/ pelvis 05/31/2014 FINDINGS: CT CHEST FINDINGS Mediastinum/Lymph Nodes: Enlarged precarinal lymph node measures 1.5 cm, previously 1.1 cm. Additional small mediastinal and hilar lymph nodes are not enlarged by size criteria. Aneurysmal dilatation of the ascending aorta, 3.8 cm currently, differences likely secondary to caliper placement. No aortic dissection. Coronary artery calcifications are seen. Pacemaker in place. Mitral annulus calcifications. No pericardial effusion. Lungs/Pleura: Progression in bilateral pleural effusions, now moderate in degree. There is adjacent compressive atelectasis in both lower lobes. Calcified granuloma in the right lung is unchanged. Musculoskeletal: There are no acute or suspicious osseous abnormalities. Multilevel degenerative change in the thoracic spine. CT ABDOMEN PELVIS FINDINGS Hepatobiliary: No focal hepatic lesion. Postcholecystectomy with unchanged prominence of the common bile duct measuring 11 mm. No calcified choledocholithiasis. Pancreas: No ductal dilatation or inflammation. Spleen: Normal in size and density, no focal abnormality. Adrenals/Urinary Tract: There is no adrenal nodule. Patient is post left nephrectomy. Homogeneous attenuation throughout the right kidney without hydronephrosis. Urinary bladder is minimally distended. Stomach/Bowel: Irregular wall thickening involving the gastric fundus. More distal stomach is decompressed. No definite perigastric lymphadenopathy. There are no dilated or thickened bowel loops. Contrast and stool throughout the colon  without abnormal distension. No bowel obstruction, oral contrast reaches the rectum. Appendix is not visualized. Vascular/Lymphatic: Dense atherosclerosis of the abdominal aorta without aneurysm. Small retroperitoneal lymph nodes, not enlarged by size criteria. Reproductive: Uterus is surgically absent. No gross adnexal mass, detailed pelvic evaluation partially obscured by streak artifact from right hip prosthesis. Other: Whole body wall edema. There is mild presacral edema, chronic. Post bilateral pelvic lymph node dissection. No free intra-abdominal air or free fluid. Musculoskeletal: Right hip arthroplasty. There are no acute or suspicious osseous abnormalities. Multilevel degenerative change in the lumbar spine. IMPRESSION: 1. Progressive bilateral pleural effusions since exam 5 days prior, now moderate in degree. There is adjacent compressive atelectasis in the lower lobes. 2. Minimal increased size in the prominent pretracheal lymph node, nonspecific, increased size likely reactive. 3. Irregular gastric wall thickening involving the fundus, concerning for malignancy. No definite perigastric adenopathy. No evidence of metastatic disease in the abdomen and pelvis. 4. Post left nephrectomy, cholecystectomy, and hysterectomy. Electronically Signed   By: MeJeb Levering.D.   On: 11/12/2015 23:55    Review of Systems  Constitutional: Positive for fever, weight loss (2-5 lbs last month, but she watches her PO intake) and malaise/fatigue. Negative for chills and diaphoresis.  HENT: Positive for hearing loss.   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 Negative for hemoptysis.   Cardiovascular: Positive for palpitations, orthopnea and PND.  Gastrointestinal: Positive  for heartburn, nausea and abdominal pain (she gets a burning sensation when she does not eat and  that can lead to nausea.). Negative for vomiting, diarrhea, constipation, blood in stool and melena.       She reports she fill up easier and doesn't want to eat after she is full.  Genitourinary: Negative.   Musculoskeletal: Negative.   Skin: Negative.   Neurological: Positive for weakness.       Neuropathy from surgery on the right knee,  Sx in both feet and legs  Endo/Heme/Allergies: Bruises/bleeds easily.  Psychiatric/Behavioral: Negative.    Blood pressure 155/82, pulse 101, temperature 98.4 F (36.9 C), temperature source Oral, resp. rate 20, height 5' 1" (1.549 m), weight 65.97 kg (145 lb 7 oz), SpO2 99 %. Physical Exam  Constitutional: She is oriented to person, place, and time.  Elderly chronically ill appearing woman.  No distress, but wheezing at rest.  HENT:  Head: Normocephalic and atraumatic.  Nose: Nose normal.  Eyes: Conjunctivae and EOM are normal. Right eye exhibits no discharge. Left eye exhibits no discharge. No scleral icterus.  Neck: Normal range of motion. Neck supple. No JVD present. No tracheal deviation present. No thyromegaly present.  Cardiovascular: Normal rate, normal heart sounds and intact distal pulses.   No murmur heard. Respiratory: Effort normal. No respiratory distress. She has wheezes. She has rales (few in base). She exhibits no tenderness.  Mid sternotomy incision  GI: Soft. Bowel sounds are normal. She exhibits no distension and no mass. There is no tenderness. There is no rebound and no guarding.  Scars on left upper abdomen for nephrectomy Midline below the umbilicus also  Musculoskeletal: She exhibits no edema.  Lymphadenopathy:    She has no cervical adenopathy.  Neurological: She is alert and oriented to person, place, and time. No cranial nerve deficit.  Skin: Skin is warm and dry. No rash noted. No erythema. No pallor.  Psychiatric: She has a normal mood and affect. Her behavior is normal. Judgment and thought content normal.     Assessment/Plan:  Gastric Mass with ATYPICAL GLANDULAR FRAGMENT HIGHLY SUSPICIOUS COR ADENOCARCINOMA ASSOCIATED WITH ULCER. Sepsis with pneumonia CAD/Aortic stenosis/Tachybrady syndrome with PTVP (Dr. 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 and make recommendations.     [name],[name] 11/13/2015, 11:11 AM

## 2015-11-13 NOTE — Consult Note (Signed)
[name]    HEMATOLOGY/ONCOLOGY CONSULTATION NOTE  Date of Service: 11/13/2015  Patient Care Team: [name], MD as PCP - General (Internal Medicine)  CHIEF COMPLAINTS/PURPOSE OF CONSULTATION:  Newly diagnosed gastric adenocarcinoma  HISTORY OF PRESENTING ILLNESS:   [name] is a wonderful 82 y.o. female who has been referred to [place] by Dr Cristal Ford, DO for evaluation and management of newly diagnosed gastric adenocarcinoma.  Patient has multiple medical comorbidities including a history of paroxysmal atrial fibrillation [controlled with amiodarone, on Coumadin], coronary artery disease, moderate aortic stenosis, tachybradycardia syndrome status post pacemaker , history of uterine cancer treated with hysterectomy and RT in the 1980s, history of renal cell cancer [status post left nephrectomy], hypothyroidism, and others.  Patient presented with increasing exertional dyspnea, productive cough and chest tightness and was noted to have oxygen saturation of 84% on room air. Noted to have tachycardia tachypnea and had concerns of sepsis due to pneumonia versus urinary tract infection.  She was also noted to have concerns with progressive anemia and blood in the stools. She subsequently had an EGD and colonoscopy. EGD showed a likely malignant gastric tumor on the anterior wall of the stomach. This was noted to be the likely site of the bleeding. Biopsy done from this tumor was highly suspicious for gastric adenocarcinoma that not enough tissue to make absolutely definitive diagnosis.  Patient has had a CT scan of the chest and abdomen showed a nonspecifically enlarged pretracheal lymph node, pleural effusions , overt metastatic disease or perigastric lymph nodes .  Medical oncology and surgery were consulted to determine appropriate treatment of the patient's gastric cancer .  At this time the patient's hemoglobin is 7.9 with an MCV of 86.8, WBC count of 8.3, platelets of 231k. She  is a Restaurant manager, fast food and notes that she would not want PRBC transfusions or other blood products even in a life-threatening bleeding situation or in the setting of requiring transfusions for chemotherapy or surgery. She understands that this might be a limiting from her treatment options .  She is okay with IV iron or erythropoietin shots to try to treat her anemia. Notes some mild upper abdominal discomfort . Overall feels better since when she was admitted still fairly fatigued and having distant exertion likely due to symptomatically anemia.   MEDICAL HISTORY:  Past Medical History  Diagnosis Date  . PAF (paroxysmal atrial fibrillation) ([place])     controlled with amiodarone, on coumadin  . CAD (coronary artery disease)   . Aortic root dilatation ([place])   . Moderate aortic stenosis   . Chronic anticoagulation   . Tachycardia-bradycardia syndrome ([place])     s/p PPM by Dr Doreatha Lew (MDT) 07/03/10  . GERD (gastroesophageal reflux disease)   . Pacemaker     2012  . Neuropathy ([place])     feet/legs  . Restless leg syndrome   . Headache(784.0)     hx migraines - takes Zoloft for migraines  . History of uterine cancer 1980s    treated with hysterectomy, external radiation and radiation seed implants.   . Cancer ([place])     hx Kidney cancer / hx of endometrial cancer  . PONV (postoperative nausea and vomiting)   . Anxiety   . Heart murmur   . HTN (hypertension)   . Hyperlipemia   . Hypothyroidism   . Osteomyelitis ([place]) as child  . Family history of anesthesia complication     daughter has ponv  . Syncope 05/28/2014  . Hepatic steatosis   .  Elevated LFTs   . Gastroparesis   . Anal fissure   . Radiation proctitis   . Gastric polyp     adenomatous  . Neuromuscular disorder ([facility])     neuropathy in right leg  . Chronic kidney disease     only has one kidney   . 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 Adenocarcinoma of stomach ([place]) 11/11/2015    SURGICAL HISTORY: Past Surgical History    Procedure Laterality Date  . Cardiac catheterization  2008  . Nephrectomy Left     renal cell cancer  . Total abdominal hysterectomy    . Patent ductus arterious repair  age 80  . Ptvp    . Pacemaker insertion  07/04/11    by Dr Doreatha Lew  . Cholecystectomy  1970s  . Upper gastrointestinal endoscopy    . Total knee arthroplasty  02/07/2012    Procedure: TOTAL KNEE ARTHROPLASTY;  Surgeon: Mauri Pole, MD;  Location: [facility];  Service: Orthopedics;  Laterality: Right;  . Insert / replace / remove pacemaker    . Total hip revision Right 2009  . I&d knee with poly exchange Right 12/17/2013    Procedure: IRRIGATION AND DEBRIDEMEN RIGHT TOTAL  KNEE WITH POLY EXCHANGE;  Surgeon: Mauri Pole, MD;  Location: [facility];  Service: Orthopedics;  Laterality: Right;  . Patellar tendon repair Right 03/06/2014    Procedure: AVULSION WITH PATELLA TENDON REPAIR;  Surgeon: Mauri Pole, MD;  Location: [facility];  Service: Orthopedics;  Laterality: Right;  . Total knee revision Right 07/29/2014    Procedure: RIGHT KNEE REVISION OF PREVIOUS REPAIR EXTENSOR MECHANISM;  Surgeon: Mauri Pole, MD;  Location: [facility];  Service: Orthopedics;  Laterality: Right;  . Colonoscopy N/A 11/11/2015    Procedure: COLONOSCOPY;  Surgeon: [name], MD;  Location: [facility];  Service: Endoscopy;  Laterality: N/A;  . Esophagogastroduodenoscopy N/A 11/11/2015    Procedure: ESOPHAGOGASTRODUODENOSCOPY (EGD);  Surgeon: [name], MD;  Location: [facility];  Service: Endoscopy;  Laterality: N/A;    SOCIAL HISTORY: Social History   Social History  . Marital Status: Married    Spouse Name: N/A  . Number of Children: N/A  . Years of Education: N/A   Occupational History  . Not on file.   Social History Main Topics  . Smoking status: Never Smoker   . Smokeless tobacco: Never Used  . Alcohol Use: No  . Drug Use: No  . Sexual Activity: No   Other Topics Concern  . Not on file   Social History Narrative    Lives in [place].  Continues to work.    FAMILY HISTORY: Family History  Problem Relation Age of Onset  . Colon cancer Mother 58  . Heart disease Mother   . Heart disease Father   . Heart attack Father   . Heart disease Maternal Grandmother     ALLERGIES:  is allergic to amiodarone; codeine; gabapentin; hydrocodone; morphine and related; requip; and zinc.  MEDICATIONS:  Current Facility-Administered Medications  Medication Dose Route Frequency Provider Last Rate Last Dose  . ALPRAZolam Duanne Moron) tablet 0.5 mg  0.5 mg Oral TID PRN Theodis Blaze, MD   0.5 mg at 11/12/15 2348  . atenolol (TENORMIN) tablet 100 mg  100 mg Oral BID Simbiso Ranga, MD   100 mg at 11/13/15 1005  . Darbepoetin Alfa (ARANESP) injection 25 mcg  25 mcg Subcutaneous Q7 days Nat Math, MD   25 mcg at 11/09/15 1953  .  guaiFENesin (MUCINEX) 12 hr tablet 1,200 mg  1,200 mg Oral BID Simbiso Ranga, MD   1,200 mg at 11/13/15 1005  . guaiFENesin-dextromethorphan (ROBITUSSIN DM) 100-10 MG/5ML syrup 5 mL  5 mL Oral Q4H PRN Theodis Blaze, MD   5 mL at 11/10/15 0456  . levalbuterol (XOPENEX) nebulizer solution 1.25 mg  1.25 mg Nebulization Q6H PRN Hewitt Shorts [place], PA-C   1.25 mg at 11/10/15 1154  . levothyroxine (SYNTHROID, LEVOTHROID) tablet 100 mcg  100 mcg Oral QAC breakfast Theodis Blaze, MD   100 mcg at 11/13/15 1005  . nitroGLYCERIN (NITROSTAT) SL tablet 0.4 mg  0.4 mg Sublingual Q5 min PRN Theodis Blaze, MD      . ondansetron [facility]) tablet 4 mg  4 mg Oral Q6H PRN Theodis Blaze, MD   4 mg at 11/09/15 1611   Or  . ondansetron (ZOFRAN) injection 4 mg  4 mg Intravenous Q6H PRN Theodis Blaze, MD      . pantoprazole (PROTONIX) injection 40 mg  40 mg Intravenous Q12H Simbiso Ranga, MD   40 mg at 11/13/15 1005  . piperacillin-tazobactam (ZOSYN) IVPB 3.375 g  3.375 g Intravenous Q8H Simbiso Ranga, MD   3.375 g at 11/13/15 0826  . potassium chloride SA (K-DUR,KLOR-CON) CR tablet 20 mEq  20 mEq Oral BID Theodis Blaze, MD    20 mEq at 11/13/15 1005  . pregabalin (LYRICA) capsule 75 mg  75 mg Oral BID Theodis Blaze, MD   75 mg at 11/13/15 1005  . sertraline (ZOLOFT) tablet 50 mg  50 mg Oral Daily Theodis Blaze, MD   50 mg at 11/13/15 1005  . traMADol (ULTRAM) tablet 50 mg  50 mg Oral Q6H PRN Theodis Blaze, MD   50 mg at 11/11/15 1752  . triamcinolone cream (KENALOG) 0.1 %   Topical TID Theodis Blaze, MD      . vitamin B-12 (CYANOCOBALAMIN) tablet 1,000 mcg  1,000 mcg Oral Daily Simbiso Ranga, MD   1,000 mcg at 11/13/15 1005    REVIEW OF SYSTEMS:    10 Point review of Systems was done is negative except as noted above.  PHYSICAL EXAMINATION: ECOG PERFORMANCE STATUS: 2 - Symptomatic, <50% confined to bed  . Filed Vitals:   11/12/15 2050 11/13/15 0521  BP: 154/90 155/82  Pulse: 94 101  Temp: 98.3 F (36.8 C) 98.4 F (36.9 C)  Resp: 22 20   Filed Weights   11/07/15 1700 11/11/15 1349  Weight: 151 lb 10.8 oz (68.8 kg) 145 lb 7 oz (65.97 kg)   .Body mass index is 27.49 kg/(m^2).  GENERAL:elderly lady,alert, in no acute distress and comfortable SKIN:  no rashes or significant lesions EYES Conjunctival pallor, no scleral icterus PHARYNX: no exudate, no erythema and lips, buccal mucosa, and tongue normal  NECK: supple, no JVD, thyroid normal size, non-tender, without nodularity LYMPH:  no palpable lymphadenopathy in the cervical, axillary or inguinal LUNGS: clear to auscultation with normal respiratory effort HEART: regular rate & rhythm,  no murmurs and no lower extremity edema ABDOMEN: abdomen soft, non-tender, normoactive bowel sounds  Musculoskeletal: no cyanosis of digits and no clubbing  PSYCH: alert & oriented x 3 with fluent speech NEURO: no focal motor/sensory deficits  LABORATORY DATA:  I have reviewed the data as listed  . CBC Latest Ref Rng 11/13/2015 11/12/2015 11/11/2015  WBC 4.0 - 10.5 K/uL 8.3 9.5 10.7(H)  Hemoglobin 12.0 - 15.0 g/dL 7.9(L) 8.0(L) 8.7(L)  Hematocrit  36.0 - 46.0 %  25.7(L) 26.0(L) 28.1(L)  Platelets 150 - 400 K/uL 231 208 234    . CMP Latest Ref Rng 11/13/2015 11/12/2015 11/11/2015  Glucose 65 - 99 mg/dL 125(H) 95 103(H)  BUN 6 - 20 mg/dL 11 10 9   Creatinine 0.44 - 1.00 mg/dL 1.01(H) 1.04(H) 0.92  Sodium 135 - 145 mmol/L 138 142 140  Potassium 3.5 - 5.1 mmol/L 3.6 3.8 4.1  Chloride 101 - 111 mmol/L 111 113(H) 114(H)  CO2 22 - 32 mmol/L 18(L) 18(L) 17(L)  Calcium 8.9 - 10.3 mg/dL 8.2(L) 8.7(L) 8.9  Total Protein 6.5 - 8.1 g/dL - - -  Total Bilirubin 0.3 - 1.2 mg/dL - - -  Alkaline Phos 38 - 126 U/L - - -  AST 15 - 41 U/L - - -  ALT 14 - 54 U/L - - -   Component     Latest Ref Rng 11/09/2015  Iron     28 - 170 ug/dL 14 (L)  TIBC     250 - 450 ug/dL 357  Saturation Ratios     10.4 - 31.8 % 4 (L)  UIBC      343  Retic Ct Pct     0.4 - 3.1 % 1.8  RBC.     3.87 - 5.11 MIL/uL 2.95 (L)  Retic Count, Manual     19.0 - 186.0 K/uL 53.1  Prothrombin Time     11.6 - 15.2 seconds 26.9 (H)  INR     0.00 - 1.49 2.52 (H)  CRP     <1.0 mg/dL 21.9 (H)  Vitamin B12     180 - 914 pg/mL 173 (L)  Folate     >5.9 ng/mL 20.3  Ferritin     11 - 307 ng/mL 48     RADIOGRAPHIC STUDIES: I have personally reviewed the radiological images as listed and agreed with the findings in the report. Dg Chest 2 View  11/07/2015  CLINICAL DATA:  Productive cough with green sputum, shortness of breath and fever for 5 days. EXAM: CHEST  2 VIEW COMPARISON:  08/02/2014 FINDINGS: Stable appearance of the right dual chamber cardiac pacemaker. Heart size is within normal limits. Aortic arch is calcified. Lungs are clear without airspace disease or pulmonary edema. No acute bone abnormality. No pleural effusions. IMPRESSION: No active cardiopulmonary disease. Electronically Signed   By: Markus Daft M.D.   On: 11/07/2015 13:13   Dg Knee 1-2 Views Right  11/08/2015  CLINICAL DATA:  Pain for 2 days. No recent trauma. Total knee replacement 3 years ago. EXAM: RIGHT KNEE - 1-2 VIEW  COMPARISON:  None. FINDINGS: The patient is status post knee replacement. Hardware is in good position with no loosening or failure identified. Surgical screws and metallic tacks project over the proximal tibia, also in good position. No joint effusion. Mild anterior soft tissue swelling is seen. No bony erosion or bony lesions. No fractures are seen. IMPRESSION: Mild anterior soft tissue swelling.  No other abnormalities. Electronically Signed   By: Dorise Bullion III M.D   On: 11/08/2015 16:00   Ct Chest Wo Contrast  11/07/2015  CLINICAL DATA:  Lethargy, fever, URI symptoms, increased coughing, shortness of breath,, weakness, fever and chills today, hypoxemia with 84% oxygen saturation on room air, history coronary artery disease, CHF, paroxysmal atrial fibrillation, hypertension, hyperlipidemia EXAM: CT CHEST WITHOUT CONTRAST TECHNIQUE: Multidetector CT imaging of the chest was performed following the standard protocol without IV contrast. Sagittal and coronal MPR  images reconstructed from axial data set. COMPARISON:  08/03/2014 FINDINGS: Beam hardening artifacts from pacemaker generator upper RIGHT chest. Extensive atherosclerotic calcifications aorta, proximal great vessels, and coronary arteries. Aneurysmal dilatation ascending thoracic aorta 4.1 cm transverse image 25. Minimally enlarged precarinal lymph node 11 mm short axis image 21. Additional scattered normal size thoracic nodes. Gallbladder surgically absent. Visualized upper abdomen otherwise unremarkable. Central peribronchial thickening. Dependent atelectasis in the posterior lungs bilaterally. Tiny LEFT pleural effusion. Calcified granuloma RIGHT upper lobe image 22. No acute infiltrate, pleural effusion, or pneumothorax. Bones demineralized with scattered degenerative disc disease changes thoracic spine. IMPRESSION: Bronchitic changes with bibasilar atelectasis. Single nonspecific minimally enlarged precarinal lymph node. Extensive  atherosclerotic calcification with aneurysmal dilatation of the ascending thoracic aorta; recommendation below. Recommend annual imaging followup by CTA or MRA. This recommendation follows 2010 ACCF/AHA/AATS/ACR/ASA/SCA/SCAI/SIR/STS/SVM Guidelines for the Diagnosis and Management of Patients with Thoracic Aortic Disease. Circulation. 2010; [id number] Electronically Signed   By: Lavonia Dana M.D.   On: 11/07/2015 19:41   Ct Chest W Contrast  11/12/2015  CLINICAL DATA:  Progressively worsening dyspnea. Subjective fever, chills and cough. Malaise and decreased oral intake. Gastric mass on endoscopy. EXAM: CT CHEST, ABDOMEN, AND PELVIS WITH CONTRAST TECHNIQUE: Multidetector CT imaging of the chest, abdomen and pelvis was performed following the standard protocol during bolus administration of intravenous contrast. CONTRAST:  40mL OMNIPAQUE IOHEXOL 300 MG/ML SOLN, 146mL OMNIPAQUE IOHEXOL 300 MG/ML SOLN COMPARISON:  Chest CT five days prior 11/07/2015. Most recent CT abdomen/ pelvis 05/31/2014 FINDINGS: CT CHEST FINDINGS Mediastinum/Lymph Nodes: Enlarged precarinal lymph node measures 1.5 cm, previously 1.1 cm. Additional small mediastinal and hilar lymph nodes are not enlarged by size criteria. Aneurysmal dilatation of the ascending aorta, 3.8 cm currently, differences likely secondary to caliper placement. No aortic dissection. Coronary artery calcifications are seen. Pacemaker in place. Mitral annulus calcifications. No pericardial effusion. Lungs/Pleura: Progression in bilateral pleural effusions, now moderate in degree. There is adjacent compressive atelectasis in both lower lobes. Calcified granuloma in the right lung is unchanged. Musculoskeletal: There are no acute or suspicious osseous abnormalities. Multilevel degenerative change in the thoracic spine. CT ABDOMEN PELVIS FINDINGS Hepatobiliary: No focal hepatic lesion. Postcholecystectomy with unchanged prominence of the common bile duct measuring 11 mm. No  calcified choledocholithiasis. Pancreas: No ductal dilatation or inflammation. Spleen: Normal in size and density, no focal abnormality. Adrenals/Urinary Tract: There is no adrenal nodule. Patient is post left nephrectomy. Homogeneous attenuation throughout the right kidney without hydronephrosis. Urinary bladder is minimally distended. Stomach/Bowel: Irregular wall thickening involving the gastric fundus. More distal stomach is decompressed. No definite perigastric lymphadenopathy. There are no dilated or thickened bowel loops. Contrast and stool throughout the colon without abnormal distension. No bowel obstruction, oral contrast reaches the rectum. Appendix is not visualized. Vascular/Lymphatic: Dense atherosclerosis of the abdominal aorta without aneurysm. Small retroperitoneal lymph nodes, not enlarged by size criteria. Reproductive: Uterus is surgically absent. No gross adnexal mass, detailed pelvic evaluation partially obscured by streak artifact from right hip prosthesis. Other: Whole body wall edema. There is mild presacral edema, chronic. Post bilateral pelvic lymph node dissection. No free intra-abdominal air or free fluid. Musculoskeletal: Right hip arthroplasty. There are no acute or suspicious osseous abnormalities. Multilevel degenerative change in the lumbar spine. IMPRESSION: 1. Progressive bilateral pleural effusions since exam 5 days prior, now moderate in degree. There is adjacent compressive atelectasis in the lower lobes. 2. Minimal increased size in the prominent pretracheal lymph node, nonspecific, increased size likely reactive. 3. Irregular gastric wall thickening  involving the fundus, concerning for malignancy. No definite perigastric adenopathy. No evidence of metastatic disease in the abdomen and pelvis. 4. Post left nephrectomy, cholecystectomy, and hysterectomy. Electronically Signed   By: Jeb Levering M.D.   On: 11/12/2015 23:55   Ct Abdomen Pelvis W Contrast  11/12/2015   CLINICAL DATA:  Progressively worsening dyspnea. Subjective fever, chills and cough. Malaise and decreased oral intake. Gastric mass on endoscopy. EXAM: CT CHEST, ABDOMEN, AND PELVIS WITH CONTRAST TECHNIQUE: Multidetector CT imaging of the chest, abdomen and pelvis was performed following the standard protocol during bolus administration of intravenous contrast. CONTRAST:  69mL OMNIPAQUE IOHEXOL 300 MG/ML SOLN, 147mL OMNIPAQUE IOHEXOL 300 MG/ML SOLN COMPARISON:  Chest CT five days prior 11/07/2015. Most recent CT abdomen/ pelvis 05/31/2014 FINDINGS: CT CHEST FINDINGS Mediastinum/Lymph Nodes: Enlarged precarinal lymph node measures 1.5 cm, previously 1.1 cm. Additional small mediastinal and hilar lymph nodes are not enlarged by size criteria. Aneurysmal dilatation of the ascending aorta, 3.8 cm currently, differences likely secondary to caliper placement. No aortic dissection. Coronary artery calcifications are seen. Pacemaker in place. Mitral annulus calcifications. No pericardial effusion. Lungs/Pleura: Progression in bilateral pleural effusions, now moderate in degree. There is adjacent compressive atelectasis in both lower lobes. Calcified granuloma in the right lung is unchanged. Musculoskeletal: There are no acute or suspicious osseous abnormalities. Multilevel degenerative change in the thoracic spine. CT ABDOMEN PELVIS FINDINGS Hepatobiliary: No focal hepatic lesion. Postcholecystectomy with unchanged prominence of the common bile duct measuring 11 mm. No calcified choledocholithiasis. Pancreas: No ductal dilatation or inflammation. Spleen: Normal in size and density, no focal abnormality. Adrenals/Urinary Tract: There is no adrenal nodule. Patient is post left nephrectomy. Homogeneous attenuation throughout the right kidney without hydronephrosis. Urinary bladder is minimally distended. Stomach/Bowel: Irregular wall thickening involving the gastric fundus. More distal stomach is decompressed. No definite  perigastric lymphadenopathy. There are no dilated or thickened bowel loops. Contrast and stool throughout the colon without abnormal distension. No bowel obstruction, oral contrast reaches the rectum. Appendix is not visualized. Vascular/Lymphatic: Dense atherosclerosis of the abdominal aorta without aneurysm. Small retroperitoneal lymph nodes, not enlarged by size criteria. Reproductive: Uterus is surgically absent. No gross adnexal mass, detailed pelvic evaluation partially obscured by streak artifact from right hip prosthesis. Other: Whole body wall edema. There is mild presacral edema, chronic. Post bilateral pelvic lymph node dissection. No free intra-abdominal air or free fluid. Musculoskeletal: Right hip arthroplasty. There are no acute or suspicious osseous abnormalities. Multilevel degenerative change in the lumbar spine. IMPRESSION: 1. Progressive bilateral pleural effusions since exam 5 days prior, now moderate in degree. There is adjacent compressive atelectasis in the lower lobes. 2. Minimal increased size in the prominent pretracheal lymph node, nonspecific, increased size likely reactive. 3. Irregular gastric wall thickening involving the fundus, concerning for malignancy. No definite perigastric adenopathy. No evidence of metastatic disease in the abdomen and pelvis. 4. Post left nephrectomy, cholecystectomy, and hysterectomy. Electronically Signed   By: Jeb Levering M.D.   On: 11/12/2015 23:55   EGD (11/11/2015) - Likely malignant gastric tumor on the anterior wall of the stomach. Biopsied. - The examination was otherwise normal.    ASSESSMENT & PLAN:    82 year old Caucasian female with multiple medical comorbidities including history of coronary artery disease, tachybradycardia syndrome status post permanent pacemaker, renal cancer status post left nephrectomy, uterine cancer status post hysterectomy and radiation therapy, paroxysmal atrial fibrillation who was on Coumadin currently  held with  #1 newly diagnosed gastric adenocarcinoma. Imaging findings of EGD and biopsy  are highly suspicious for gastric adenocarcinoma. Limited sample precluded definitive diagnosis. CT chest abdomen pelvis shows no overt evidence of perigastric lymph node enlargement or obvious metastatic disease.  #2 normocytic normochromic anemia - predominant factor appears to be acute blood loss anemia with iron deficiency, B12 deficiency, anemia of chronic kidney disease [patient has signal kidney], anemia of chronic disease related to her gastric adenocarcinoma.  #3 iron deficiency  #4 vitamin B12 deficiency.  #5 single kidney status post left nephrectomy for renal cell carcinoma  #6 hypothyroidism  #7 paroxysmal atrial fibrillation was controlled with admitted on and was on Coumadin prior to bleeding currently off Coumadin.  #8 coronary artery disease.  #9 history of uterine carcinoma - remote history status post hysterectomy and RT.  #10 Jehovah's Witness - patient vehemently refuses any PRBC transfusions under all circumstances including life threatening situations.  #11 possible sepsis due to pneumonia/UTI Plan -Management of antibiotics and sepsis as per her hospital medicine team. -Patient is refusing PRBC transfusions due to being a Jehovah's Witness. -Would recommend IV Feraheme 510mg  every weekly 2 doses -[place] B12 thousand micrograms daily for a week then weekly for a month and then monthly -Aranesp 200 g subcutaneous weekly 2 doses. -Patient develop anticoagulation for bleeding. -Helicobacter pylori IgG- if positive would empirically treat her. -PPI twice a day -Carafate 4 times a day -If her hemoglobin continues to drop or she has signs of overt GI bleeding - might need to consider angiography with embolization as per IR. -Evaluated by surgery they want to reevaluate her in the clinic setting to determine if she would be a surgical candidate. -She would need a cardiology  clearance to be considered fit for surgery. -If surgery is being considered patient will eventually need an endoscopic ultrasound for local staging and PET CT scan as outpatient when her acute issues are over. -Ideally she would need a repeat biopsy for more definitive tissue sample. -If she does not respond to the IV iron B12 and Aranesp in the absence of blood transfusion ability she might have limited treatment options and she understands this. -Ongoing goals of care discussion.   We'll follow her up in clinic in one week post hospitalization with Dr. Irene Limbo with CBC, CMP. Post hospitalization follow-up [name] from surgery as well.  All of the patients questions were answered with apparent satisfaction. The patient knows to call the clinic with any problems, questions or concerns.  I spent 80 minutes counseling the patient face to face. The total time spent in the appointment was 80 minutes and more than 50% was on counseling and direct patient cares.    Sullivan Lone MD [place] AAHIVMS [facility] Hematology/Oncology Physician [facility]  (Office):       [phone] (Work cell):  [phone] (Fax):           [phone]  11/13/2015 11:47 AM

## 2015-11-13 NOTE — Progress Notes (Signed)
[facility] Progress Note  Subjective:  Had CT chest/abd/pelvis--Progression in bilateral pleural effusions, now moderate in degree. There is adjacent compressive atelectasis in both lower lobes. Calcified granuloma in the right lung is Unchanged. No focal hepatic lesion.Pancreas: No ductal dilatation or inflammation.Stomach/Bowel: Irregular wall thickening involving the gastric fundus. More distal stomach is decompressed. No definite perigastric lymphadenopathy. There are no dilated or thickened bowel loops. Contrast and stool throughout the colon without abnormal distension. No bowel obstruction, oral contrast reaches the rectum. Appendix is not visualized. Pathology from biopsies of stomach:glandular fragment highly suspicious for adeno CA assoc with ulcer.H pylori antibody pending.Hgb 7.9. Dr Carlean Purl reviewed pt with Dr Ardis Hughs re: EUS--felt it would not provide much additional info. Ptr says she feels much better today. Denies abd pain, nausea, vomting, diarrhea. Denies SOB, CP.  Objective:  Vital signs in last 24 hours: Temp:  [98.3 F (36.8 C)-98.4 F (36.9 C)] 98.4 F (36.9 C) (03/09 0521) Pulse Rate:  [94-104] 101 (03/09 0521) Resp:  [20-52] 20 (03/09 0521) BP: (136-155)/(71-90) 155/82 mmHg (03/09 0521) SpO2:  [98 %-100 %] 99 % (03/09 0521) Last BM Date: 11/12/15 General:   Alert,  Well-developed, in NAD Heart: irreg Pulm;lungs clear Abdomen:  Soft, nontender and nondistended. Normal bowel sounds, without guarding, and without rebound.   Extremities:  Without edema. Neurologic: Alert and  oriented x4;  grossly normal neurologically. Psych:  Alert and cooperative. Normal mood and affect.  Intake/Output from previous day: 03/08 0701 - 03/09 0700 In: 870 [P.O.:720; IV Piggyback:150] Out: -  Intake/Output this shift:    Lab Results:  Recent Labs  11/11/15 0439 11/12/15 0514 11/13/15 0445  WBC 10.7* 9.5 8.3  HGB 8.7* 8.0* 7.9*  HCT 28.1* 26.0*  25.7*  PLT 234 208 231   BMET  Recent Labs  11/11/15 0439 11/12/15 0514 11/13/15 0445  NA 140 142 138  K 4.1 3.8 3.6  CL 114* 113* 111  CO2 17* 18* 18*  GLUCOSE 103* 95 125*  BUN 9 10 11   CREATININE 0.92 1.04* 1.01*  CALCIUM 8.9 8.7* 8.2*   PT/INR  Recent Labs  11/11/15 0439  LABPROT 15.3*  INR 1.20     Ct Chest W Contrast  11/12/2015  CLINICAL DATA:  Progressively worsening dyspnea. Subjective fever, chills and cough. Malaise and decreased oral intake. Gastric mass on endoscopy. EXAM: CT CHEST, ABDOMEN, AND PELVIS WITH CONTRAST TECHNIQUE: Multidetector CT imaging of the chest, abdomen and pelvis was performed following the standard protocol during bolus administration of intravenous contrast. CONTRAST:  21mL OMNIPAQUE IOHEXOL 300 MG/ML SOLN, 175mL OMNIPAQUE IOHEXOL 300 MG/ML SOLN COMPARISON:  Chest CT five days prior 11/07/2015. Most recent CT abdomen/ pelvis 05/31/2014 FINDINGS: CT CHEST FINDINGS Mediastinum/Lymph Nodes: Enlarged precarinal lymph node measures 1.5 cm, previously 1.1 cm. Additional small mediastinal and hilar lymph nodes are not enlarged by size criteria. Aneurysmal dilatation of the ascending aorta, 3.8 cm currently, differences likely secondary to caliper placement. No aortic dissection. Coronary artery calcifications are seen. Pacemaker in place. Mitral annulus calcifications. No pericardial effusion. Lungs/Pleura: Progression in bilateral pleural effusions, now moderate in degree. There is adjacent compressive atelectasis in both lower lobes. Calcified granuloma in the right lung is unchanged. Musculoskeletal: There are no acute or suspicious osseous abnormalities. Multilevel degenerative change in the thoracic spine. CT ABDOMEN PELVIS FINDINGS Hepatobiliary: No focal hepatic lesion. Postcholecystectomy with unchanged prominence of the common bile duct measuring 11 mm. No calcified choledocholithiasis. Pancreas: No ductal dilatation or inflammation. Spleen: Normal  in size and density, no focal abnormality. Adrenals/Urinary Tract: There is no adrenal nodule. Patient is post left nephrectomy. Homogeneous attenuation throughout the right kidney without hydronephrosis. Urinary bladder is minimally distended. Stomach/Bowel: Irregular wall thickening involving the gastric fundus. More distal stomach is decompressed. No definite perigastric lymphadenopathy. There are no dilated or thickened bowel loops. Contrast and stool throughout the colon without abnormal distension. No bowel obstruction, oral contrast reaches the rectum. Appendix is not visualized. Vascular/Lymphatic: Dense atherosclerosis of the abdominal aorta without aneurysm. Small retroperitoneal lymph nodes, not enlarged by size criteria. Reproductive: Uterus is surgically absent. No gross adnexal mass, detailed pelvic evaluation partially obscured by streak artifact from right hip prosthesis. Other: Whole body wall edema. There is mild presacral edema, chronic. Post bilateral pelvic lymph node dissection. No free intra-abdominal air or free fluid. Musculoskeletal: Right hip arthroplasty. There are no acute or suspicious osseous abnormalities. Multilevel degenerative change in the lumbar spine. IMPRESSION: 1. Progressive bilateral pleural effusions since exam 5 days prior, now moderate in degree. There is adjacent compressive atelectasis in the lower lobes. 2. Minimal increased size in the prominent pretracheal lymph node, nonspecific, increased size likely reactive. 3. Irregular gastric wall thickening involving the fundus, concerning for malignancy. No definite perigastric adenopathy. No evidence of metastatic disease in the abdomen and pelvis. 4. Post left nephrectomy, cholecystectomy, and hysterectomy. Electronically Signed   By: Jeb Levering M.D.   On: 11/12/2015 23:55   Ct Abdomen Pelvis W Contrast  11/12/2015  CLINICAL DATA:  Progressively worsening dyspnea. Subjective fever, chills and cough. Malaise and  decreased oral intake. Gastric mass on endoscopy. EXAM: CT CHEST, ABDOMEN, AND PELVIS WITH CONTRAST TECHNIQUE: Multidetector CT imaging of the chest, abdomen and pelvis was performed following the standard protocol during bolus administration of intravenous contrast. CONTRAST:  3mL OMNIPAQUE IOHEXOL 300 MG/ML SOLN, 162mL OMNIPAQUE IOHEXOL 300 MG/ML SOLN COMPARISON:  Chest CT five days prior 11/07/2015. Most recent CT abdomen/ pelvis 05/31/2014 FINDINGS: CT CHEST FINDINGS Mediastinum/Lymph Nodes: Enlarged precarinal lymph node measures 1.5 cm, previously 1.1 cm. Additional small mediastinal and hilar lymph nodes are not enlarged by size criteria. Aneurysmal dilatation of the ascending aorta, 3.8 cm currently, differences likely secondary to caliper placement. No aortic dissection. Coronary artery calcifications are seen. Pacemaker in place. Mitral annulus calcifications. No pericardial effusion. Lungs/Pleura: Progression in bilateral pleural effusions, now moderate in degree. There is adjacent compressive atelectasis in both lower lobes. Calcified granuloma in the right lung is unchanged. Musculoskeletal: There are no acute or suspicious osseous abnormalities. Multilevel degenerative change in the thoracic spine. CT ABDOMEN PELVIS FINDINGS Hepatobiliary: No focal hepatic lesion. Postcholecystectomy with unchanged prominence of the common bile duct measuring 11 mm. No calcified choledocholithiasis. Pancreas: No ductal dilatation or inflammation. Spleen: Normal in size and density, no focal abnormality. Adrenals/Urinary Tract: There is no adrenal nodule. Patient is post left nephrectomy. Homogeneous attenuation throughout the right kidney without hydronephrosis. Urinary bladder is minimally distended. Stomach/Bowel: Irregular wall thickening involving the gastric fundus. More distal stomach is decompressed. No definite perigastric lymphadenopathy. There are no dilated or thickened bowel loops. Contrast and stool  throughout the colon without abnormal distension. No bowel obstruction, oral contrast reaches the rectum. Appendix is not visualized. Vascular/Lymphatic: Dense atherosclerosis of the abdominal aorta without aneurysm. Small retroperitoneal lymph nodes, not enlarged by size criteria. Reproductive: Uterus is surgically absent. No gross adnexal mass, detailed pelvic evaluation partially obscured by streak artifact from right hip prosthesis. Other: Whole body wall edema. There is mild presacral edema, chronic. Post  bilateral pelvic lymph node dissection. No free intra-abdominal air or free fluid. Musculoskeletal: Right hip arthroplasty. There are no acute or suspicious osseous abnormalities. Multilevel degenerative change in the lumbar spine. IMPRESSION: 1. Progressive bilateral pleural effusions since exam 5 days prior, now moderate in degree. There is adjacent compressive atelectasis in the lower lobes. 2. Minimal increased size in the prominent pretracheal lymph node, nonspecific, increased size likely reactive. 3. Irregular gastric wall thickening involving the fundus, concerning for malignancy. No definite perigastric adenopathy. No evidence of metastatic disease in the abdomen and pelvis. 4. Post left nephrectomy, cholecystectomy, and hysterectomy. Electronically Signed   By: Jeb Levering M.D.   On: 11/12/2015 23:55    ASSESSMENT/PLAN:  82 year old female with a history of atrial fibrillation on chronic anticoagulation, admitted due to dyspnea, found to have pneumonia and an iron deficiency anemia with heme positive stools.EGD 11/11/15 with A large, polypoid mass with oozing bleeding and stigmata of recent   bleeding was found on the anterior wall of the stomach.Biopsies suspicious for adeno CA. CT chest with progressive pleural effusions. CT abd with irreg gastric wall thickening involving the fundus, concerning for malignancy.  Would cont supportive care.  Obtain gen surg consult and oncology  consult. Afib--was on long term antocoag therapy--currently on hold to to GIB.  IDA--pt is [employer] witness and declines blood products. XCont iron and B12 supplementation, aranesp.     LOS: 6 days   Hvozdovic, Vita Barley PA-C 11/13/2015, Pager [phone] Mon-Fri 8a-5p [phone] after 5p, weekends, holidays    [facility] Attending   I have taken an interval history, reviewed the chart and examined the patient. I agree with the Advanced Practitioner's note, impression and recommendations.     Plans are fore medical Tx and improvement and then potential surgery I would leave her off anti-coagulation i am ordering IV iron Tx per pharmacy to try to maximize her Hgb - ferritin 4d ago technically NL but was 48 only and w/ blood loss she had expect that will be used up.  Signing off now - does not need a GI appt at [place], MD, [facility] [phone] (pager) [phone] after 5 PM, weekends and holidays  11/13/2015 5:26 PM

## 2015-11-13 NOTE — Progress Notes (Signed)
[facility]                                                                              Patient Demographics  [name], is a 82 y.o. female, DOB - March 05, 1934, [id number]  Admit date - 11/07/2015   Admitting Physician Theodis Blaze, MD  Outpatient Primary MD for the patient is [name], MD  LOS - 6   Chief Complaint  Patient presents with  . Fever      HPI on 11/07/2015 by Dr. Mart Piggs Pt is 82 yo female with multiple medical issues including PAF on chronic AC, HTN, Hypothyroidism, presented to [facility] ED with main concern of several days duration of progressively worsening dyspnea that initially started with exertion and has progressed to dyspnea at rest in the past 24 hours, associated with subjective fevers, chills, cough productive of yellow sputum, chest tightness that occurs with coughing spells, malaise, poor oral intake, wheezing, difficulty with ambulation due to dyspnea. She denies known sick contacts or exposures, no abd or urinary concerns, daughter called EMS and oxygen saturation noted to be 84% on RA.  In ED, pt noted to be hemodynamically stable but in mild distress due to dyspnea, VS notable for T 102 F, HR up to 127, RR > 22, WBC 15 K and lactic acid 2.13. CXR with no clear signs of infectious etiology. Pt started Rocephin and Zithromax and TRH asked to admit for further evaluation.   Assessment & Plan   Sepsis secondary to Strep viridans bactermia/Pneumonia -Upon admission, patient was febrile with leukocytosis, tachycardia and tachypnea and elevated lactic acid -Chest x-ray showed no active cardio pulmonary disease -Urine culture showed multiple species -Blood cultures from 11/07/2015: 1/2 strep viridans -Repeat cultures 11/10/2015 negative -Initially placed on azithromycin and ceftriaxone, transitioned to Zosyn  -leukocytosis resolved -Strep pneumonia and legionella urine antigens negative  Iron deficiency anemia/GI bleed/ Gastric  mass -FOBT + -Gastroenterology consultation appreciated -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, biopsies suspicious for adenocarcinoma -Colonoscopy: colon normal -GI recommended holding Coumadin -General surgery and oncology consulted and appreciated -Hemoglobin 7.9 (patient is a Jehovah's witness) -Continue aranesp  Bilateral pleural effusions -Seen on CT chest, moderate -Will order lasix dose and continue to monitor  Right knee pain -s/p Right TKA -Continue pain control -PT consulted, recommended HH  Atrial fibrillation -CHADSVASC 4 (age, gender, HTN) -Continue atenolol -Coumadin held due to GI bleed  Hypothyroidism -Continue Synthroid  Hypertension -Continue atenolol  CKD, stage III -stable, continue to monitor BMP  Code Status: Full  Family Communication: Daughter at bedside  Disposition Plan: Admitted. Pending general surgery and oncology consults.  Monitoring hemoglobin.  Time Spent in minutes   30 minutes  Procedures  EGD Colonoscopy   Consults   Gastroenterology  General surgery Oncology  DVT Prophylaxis  SCDs  Lab Results  Component Value Date   PLT 231 11/13/2015    Medications  Scheduled Meds: . atenolol  100 mg Oral BID  . Darbepoetin Alfa  25 mcg Subcutaneous Q7 days  . guaiFENesin  1,200 mg Oral BID  . levothyroxine  100 mcg Oral QAC breakfast  . pantoprazole (PROTONIX) IV  40 mg Intravenous Q12H  . piperacillin-tazobactam  3.375 g Intravenous Q8H  . potassium chloride SA  20 mEq Oral BID  . pregabalin  75 mg Oral BID  . sertraline  50 mg Oral Daily  . triamcinolone cream   Topical TID  . vitamin B-12  1,000 mcg Oral Daily   Continuous Infusions:  PRN Meds:.ALPRAZolam, guaiFENesin-dextromethorphan, levalbuterol, nitroGLYCERIN, ondansetron **OR** ondansetron (ZOFRAN) IV, traMADol  Antibiotics    Anti-infectives    Start     Dose/Rate Route Frequency Ordered Stop   11/09/15 0600  vancomycin (VANCOCIN) 500 mg in sodium  chloride 0.9 % 100 mL IVPB  Status:  Discontinued     500 mg 100 mL/hr over 60 Minutes Intravenous Every 12 hours 11/08/15 1538 11/11/15 2117   11/08/15 1600  piperacillin-tazobactam (ZOSYN) IVPB 3.375 g     3.375 g 12.5 mL/hr over 240 Minutes Intravenous Every 8 hours 11/08/15 1431     11/08/15 1600  vancomycin (VANCOCIN) IVPB 1000 mg/200 mL premix     1,000 mg 200 mL/hr over 60 Minutes Intravenous  Once 11/08/15 1536 11/08/15 1710   11/08/15 1400  azithromycin (ZITHROMAX) 500 mg in dextrose 5 % 250 mL IVPB  Status:  Discontinued     500 mg 250 mL/hr over 60 Minutes Intravenous Every 24 hours 11/07/15 1714 11/11/15 2131   11/08/15 1200  cefTRIAXone (ROCEPHIN) 2 g in dextrose 5 % 50 mL IVPB  Status:  Discontinued     2 g 100 mL/hr over 30 Minutes Intravenous Every 24 hours 11/07/15 1714 11/08/15 1431   11/07/15 1715  cefTRIAXone (ROCEPHIN) 1 g in dextrose 5 % 50 mL IVPB  Status:  Discontinued     1 g 100 mL/hr over 30 Minutes Intravenous  Once 11/07/15 1701 11/07/15 1715   11/07/15 1715  azithromycin (ZITHROMAX) 500 mg in dextrose 5 % 250 mL IVPB  Status:  Discontinued     500 mg 250 mL/hr over 60 Minutes Intravenous  Once 11/07/15 1701 11/07/15 1715   11/07/15 1330  azithromycin (ZITHROMAX) 500 mg in dextrose 5 % 250 mL IVPB     500 mg 250 mL/hr over 60 Minutes Intravenous  Once 11/07/15 1321 11/07/15 1530   11/07/15 1330  cefTRIAXone (ROCEPHIN) 1 g in dextrose 5 % 50 mL IVPB     1 g 100 mL/hr over 30 Minutes Intravenous  Once 11/07/15 1321 11/07/15 1421      Subjective:   [name] seen and examined today.  Patient states she is feeling better today.  Denies any chest pain, shortness of breath, abdominal pain, nausea or vomiting, diarrhea or constipation.   Objective:   Filed Vitals:   11/12/15 0441 11/12/15 1506 11/12/15 2050 11/13/15 0521  BP: 128/72 136/71 154/90 155/82  Pulse: 95 104 94 101  Temp: 99.2 F (37.3 C) 98.3 F (36.8 C) 98.3 F (36.8 C) 98.4 F (36.9  C)  TempSrc: Oral Oral Oral Oral  Resp: 20 52 22 20  Height:      Weight:      SpO2: 94% 98% 100% 99%    Wt Readings from Last 3 Encounters:  11/11/15 65.97 kg (145 lb 7 oz)  10/14/15 68.221 kg (150 lb 6.4 oz)  10/09/15 67.586 kg (149 lb)     Intake/Output Summary (Last 24 hours) at 11/13/15 1143 Last data filed at 11/12/15 2359  Gross per 24 hour  Intake    580 ml  Output      0 ml  Net    580 ml  Exam  General: Well developed, well nourished, NAD  HEENT: NCAT, mucous membranes mildly dry.  Cardiovascular: S1 S2 auscultated, IRR  Respiratory: Clear to auscultation bilaterally   Abdomen: Soft, nontender, nondistended, + bowel sounds  Extremities: warm dry without cyanosis clubbing or edema  Neuro: AAOx3, nonfocal  Psych: Normal affect and demeanor, pleasant.   Data Review   Micro Results Recent Results (from the past 240 hour(s))  Culture, blood (routine x 2)     Status: None   Collection Time: 11/07/15 12:28 PM  Result Value Ref Range Status   Specimen Description BLOOD RIGHT ANTECUBITAL  Final   Special Requests IN PEDIATRIC BOTTLE 4ML  Final   Culture   Final    NO GROWTH 5 DAYS Performed at [facility]    Report Status 11/12/2015 FINAL  Final  Culture, blood (routine x 2)     Status: None   Collection Time: 11/07/15 12:29 PM  Result Value Ref Range Status   Specimen Description BLOOD LEFT HAND  Final   Special Requests IN PEDIATRIC BOTTLE 2ML  Final   Culture  Setup Time   Final    GRAM POSITIVE COCCI IN CHAINS IN PEDIATRIC BOTTLE CRITICAL RESULT CALLED TO, READ BACK BY AND VERIFIED WITH: C ADDISON,RN @0449  11/08/15 MKELLY    Culture   Final    VIRIDANS STREPTOCOCCUS THE SIGNIFICANCE OF ISOLATING THIS ORGANISM FROM A SINGLE SET OF BLOOD CULTURES WHEN MULTIPLE SETS ARE DRAWN IS UNCERTAIN. PLEASE NOTIFY THE MICROBIOLOGY DEPARTMENT WITHIN ONE WEEK IF SPECIATION AND SENSITIVITIES ARE REQUIRED. Performed at [facility]    Report  Status 11/11/2015 FINAL  Final  Urine culture     Status: None   Collection Time: 11/07/15  4:35 PM  Result Value Ref Range Status   Specimen Description URINE, CLEAN CATCH  Final   Special Requests NONE  Final   Culture   Final    MULTIPLE SPECIES PRESENT, SUGGEST RECOLLECTION Performed at [facility]    Report Status 11/09/2015 FINAL  Final  Culture, sputum-assessment     Status: None   Collection Time: 11/07/15  8:42 PM  Result Value Ref Range Status   Specimen Description SPUTUM  Final   Special Requests NONE  Final   Sputum evaluation   Final    THIS SPECIMEN IS ACCEPTABLE. RESPIRATORY CULTURE REPORT TO FOLLOW.   Report Status 11/07/2015 FINAL  Final  Culture, respiratory (NON-Expectorated)     Status: None   Collection Time: 11/07/15  8:42 PM  Result Value Ref Range Status   Specimen Description SPUTUM  Final   Special Requests NONE  Final   Gram Stain   Final    ABUNDANT WBC PRESENT, PREDOMINANTLY PMN FEW SQUAMOUS EPITHELIAL CELLS PRESENT MODERATE GRAM POSITIVE COCCI IN PAIRS IN CLUSTERS FEW GRAM POSITIVE RODS THIS SPECIMEN IS ACCEPTABLE FOR SPUTUM CULTURE Performed at [facility]    Culture   Final    NORMAL OROPHARYNGEAL FLORA Performed at [facility]    Report Status 11/10/2015 FINAL  Final  Culture, blood (routine x 2)     Status: None (Preliminary result)   Collection Time: 11/10/15  5:10 AM  Result Value Ref Range Status   Specimen Description BLOOD LEFT ARM  Final   Special Requests IN PEDIATRIC BOTTLE 3ML  Final   Culture   Final    NO GROWTH 2 DAYS Performed at [facility]    Report Status PENDING  Incomplete  Culture, blood (routine x  2)     Status: None (Preliminary result)   Collection Time: 11/10/15  8:00 AM  Result Value Ref Range Status   Specimen Description BLOOD LEFT ARM  Final   Special Requests BOTTLES DRAWN AEROBIC AND ANAEROBIC [place]  Final   Culture   Final    NO GROWTH 2 DAYS Performed at [facility]    Report Status PENDING  Incomplete    Radiology Reports Dg Chest 2 View  11/07/2015  CLINICAL DATA:  Productive cough with green sputum, shortness of breath and fever for 5 days. EXAM: CHEST  2 VIEW COMPARISON:  08/02/2014 FINDINGS: Stable appearance of the right dual chamber cardiac pacemaker. Heart size is within normal limits. Aortic arch is calcified. Lungs are clear without airspace disease or pulmonary edema. No acute bone abnormality. No pleural effusions. IMPRESSION: No active cardiopulmonary disease. Electronically Signed   By: Markus Daft M.D.   On: 11/07/2015 13:13   Dg Knee 1-2 Views Right  11/08/2015  CLINICAL DATA:  Pain for 2 days. No recent trauma. Total knee replacement 3 years ago. EXAM: RIGHT KNEE - 1-2 VIEW COMPARISON:  None. FINDINGS: The patient is status post knee replacement. Hardware is in good position with no loosening or failure identified. Surgical screws and metallic tacks project over the proximal tibia, also in good position. No joint effusion. Mild anterior soft tissue swelling is seen. No bony erosion or bony lesions. No fractures are seen. IMPRESSION: Mild anterior soft tissue swelling.  No other abnormalities. Electronically Signed   By: Dorise Bullion III M.D   On: 11/08/2015 16:00   Ct Chest Wo Contrast  11/07/2015  CLINICAL DATA:  Lethargy, fever, URI symptoms, increased coughing, shortness of breath,, weakness, fever and chills today, hypoxemia with 84% oxygen saturation on room air, history coronary artery disease, CHF, paroxysmal atrial fibrillation, hypertension, hyperlipidemia EXAM: CT CHEST WITHOUT CONTRAST TECHNIQUE: Multidetector CT imaging of the chest was performed following the standard protocol without IV contrast. Sagittal and coronal MPR images reconstructed from axial data set. COMPARISON:  08/03/2014 FINDINGS: Beam hardening artifacts from pacemaker generator upper RIGHT chest. Extensive atherosclerotic calcifications aorta, proximal great  vessels, and coronary arteries. Aneurysmal dilatation ascending thoracic aorta 4.1 cm transverse image 25. Minimally enlarged precarinal lymph node 11 mm short axis image 21. Additional scattered normal size thoracic nodes. Gallbladder surgically absent. Visualized upper abdomen otherwise unremarkable. Central peribronchial thickening. Dependent atelectasis in the posterior lungs bilaterally. Tiny LEFT pleural effusion. Calcified granuloma RIGHT upper lobe image 22. No acute infiltrate, pleural effusion, or pneumothorax. Bones demineralized with scattered degenerative disc disease changes thoracic spine. IMPRESSION: Bronchitic changes with bibasilar atelectasis. Single nonspecific minimally enlarged precarinal lymph node. Extensive atherosclerotic calcification with aneurysmal dilatation of the ascending thoracic aorta; recommendation below. Recommend annual imaging followup by CTA or MRA. This recommendation follows 2010 ACCF/AHA/AATS/ACR/ASA/SCA/SCAI/SIR/STS/SVM Guidelines for the Diagnosis and Management of Patients with Thoracic Aortic Disease. Circulation. 2010; [id number] Electronically Signed   By: Lavonia Dana M.D.   On: 11/07/2015 19:41   Ct Chest W Contrast  11/12/2015  CLINICAL DATA:  Progressively worsening dyspnea. Subjective fever, chills and cough. Malaise and decreased oral intake. Gastric mass on endoscopy. EXAM: CT CHEST, ABDOMEN, AND PELVIS WITH CONTRAST TECHNIQUE: Multidetector CT imaging of the chest, abdomen and pelvis was performed following the standard protocol during bolus administration of intravenous contrast. CONTRAST:  72mL OMNIPAQUE IOHEXOL 300 MG/ML SOLN, 132mL OMNIPAQUE IOHEXOL 300 MG/ML SOLN COMPARISON:  Chest CT five days prior 11/07/2015. Most recent CT  abdomen/ pelvis 05/31/2014 FINDINGS: CT CHEST FINDINGS Mediastinum/Lymph Nodes: Enlarged precarinal lymph node measures 1.5 cm, previously 1.1 cm. Additional small mediastinal and hilar lymph nodes are not enlarged by size  criteria. Aneurysmal dilatation of the ascending aorta, 3.8 cm currently, differences likely secondary to caliper placement. No aortic dissection. Coronary artery calcifications are seen. Pacemaker in place. Mitral annulus calcifications. No pericardial effusion. Lungs/Pleura: Progression in bilateral pleural effusions, now moderate in degree. There is adjacent compressive atelectasis in both lower lobes. Calcified granuloma in the right lung is unchanged. Musculoskeletal: There are no acute or suspicious osseous abnormalities. Multilevel degenerative change in the thoracic spine. CT ABDOMEN PELVIS FINDINGS Hepatobiliary: No focal hepatic lesion. Postcholecystectomy with unchanged prominence of the common bile duct measuring 11 mm. No calcified choledocholithiasis. Pancreas: No ductal dilatation or inflammation. Spleen: Normal in size and density, no focal abnormality. Adrenals/Urinary Tract: There is no adrenal nodule. Patient is post left nephrectomy. Homogeneous attenuation throughout the right kidney without hydronephrosis. Urinary bladder is minimally distended. Stomach/Bowel: Irregular wall thickening involving the gastric fundus. More distal stomach is decompressed. No definite perigastric lymphadenopathy. There are no dilated or thickened bowel loops. Contrast and stool throughout the colon without abnormal distension. No bowel obstruction, oral contrast reaches the rectum. Appendix is not visualized. Vascular/Lymphatic: Dense atherosclerosis of the abdominal aorta without aneurysm. Small retroperitoneal lymph nodes, not enlarged by size criteria. Reproductive: Uterus is surgically absent. No gross adnexal mass, detailed pelvic evaluation partially obscured by streak artifact from right hip prosthesis. Other: Whole body wall edema. There is mild presacral edema, chronic. Post bilateral pelvic lymph node dissection. No free intra-abdominal air or free fluid. Musculoskeletal: Right hip arthroplasty. There are  no acute or suspicious osseous abnormalities. Multilevel degenerative change in the lumbar spine. IMPRESSION: 1. Progressive bilateral pleural effusions since exam 5 days prior, now moderate in degree. There is adjacent compressive atelectasis in the lower lobes. 2. Minimal increased size in the prominent pretracheal lymph node, nonspecific, increased size likely reactive. 3. Irregular gastric wall thickening involving the fundus, concerning for malignancy. No definite perigastric adenopathy. No evidence of metastatic disease in the abdomen and pelvis. 4. Post left nephrectomy, cholecystectomy, and hysterectomy. Electronically Signed   By: Jeb Levering M.D.   On: 11/12/2015 23:55   Ct Abdomen Pelvis W Contrast  11/12/2015  CLINICAL DATA:  Progressively worsening dyspnea. Subjective fever, chills and cough. Malaise and decreased oral intake. Gastric mass on endoscopy. EXAM: CT CHEST, ABDOMEN, AND PELVIS WITH CONTRAST TECHNIQUE: Multidetector CT imaging of the chest, abdomen and pelvis was performed following the standard protocol during bolus administration of intravenous contrast. CONTRAST:  99mL OMNIPAQUE IOHEXOL 300 MG/ML SOLN, 122mL OMNIPAQUE IOHEXOL 300 MG/ML SOLN COMPARISON:  Chest CT five days prior 11/07/2015. Most recent CT abdomen/ pelvis 05/31/2014 FINDINGS: CT CHEST FINDINGS Mediastinum/Lymph Nodes: Enlarged precarinal lymph node measures 1.5 cm, previously 1.1 cm. Additional small mediastinal and hilar lymph nodes are not enlarged by size criteria. Aneurysmal dilatation of the ascending aorta, 3.8 cm currently, differences likely secondary to caliper placement. No aortic dissection. Coronary artery calcifications are seen. Pacemaker in place. Mitral annulus calcifications. No pericardial effusion. Lungs/Pleura: Progression in bilateral pleural effusions, now moderate in degree. There is adjacent compressive atelectasis in both lower lobes. Calcified granuloma in the right lung is unchanged.  Musculoskeletal: There are no acute or suspicious osseous abnormalities. Multilevel degenerative change in the thoracic spine. CT ABDOMEN PELVIS FINDINGS Hepatobiliary: No focal hepatic lesion. Postcholecystectomy with unchanged prominence of the common bile duct measuring 11 mm.  No calcified choledocholithiasis. Pancreas: No ductal dilatation or inflammation. Spleen: Normal in size and density, no focal abnormality. Adrenals/Urinary Tract: There is no adrenal nodule. Patient is post left nephrectomy. Homogeneous attenuation throughout the right kidney without hydronephrosis. Urinary bladder is minimally distended. Stomach/Bowel: Irregular wall thickening involving the gastric fundus. More distal stomach is decompressed. No definite perigastric lymphadenopathy. There are no dilated or thickened bowel loops. Contrast and stool throughout the colon without abnormal distension. No bowel obstruction, oral contrast reaches the rectum. Appendix is not visualized. Vascular/Lymphatic: Dense atherosclerosis of the abdominal aorta without aneurysm. Small retroperitoneal lymph nodes, not enlarged by size criteria. Reproductive: Uterus is surgically absent. No gross adnexal mass, detailed pelvic evaluation partially obscured by streak artifact from right hip prosthesis. Other: Whole body wall edema. There is mild presacral edema, chronic. Post bilateral pelvic lymph node dissection. No free intra-abdominal air or free fluid. Musculoskeletal: Right hip arthroplasty. There are no acute or suspicious osseous abnormalities. Multilevel degenerative change in the lumbar spine. IMPRESSION: 1. Progressive bilateral pleural effusions since exam 5 days prior, now moderate in degree. There is adjacent compressive atelectasis in the lower lobes. 2. Minimal increased size in the prominent pretracheal lymph node, nonspecific, increased size likely reactive. 3. Irregular gastric wall thickening involving the fundus, concerning for malignancy.  No definite perigastric adenopathy. No evidence of metastatic disease in the abdomen and pelvis. 4. Post left nephrectomy, cholecystectomy, and hysterectomy. Electronically Signed   By: Jeb Levering M.D.   On: 11/12/2015 23:55    CBC  Recent Labs Lab 11/07/15 1228  11/09/15 0605 11/10/15 0511 11/11/15 0439 11/12/15 0514 11/13/15 0445  WBC 15.0*  < > 12.8* 11.3* 10.7* 9.5 8.3  HGB 10.2*  < > 7.9* 8.3* 8.7* 8.0* 7.9*  HCT 32.7*  < > 25.6* 26.8* 28.1* 26.0* 25.7*  PLT 238  < > 179 202 234 208 231  MCV 86.7  < > 86.8 86.7 86.2 85.2 86.8  MCH 27.1  < > 26.8 26.9 26.7 26.2 26.7  MCHC 31.2  < > 30.9 31.0 31.0 30.8 30.7  RDW 14.6  < > 15.6* 15.5 15.5 15.5 15.9*  LYMPHSABS 0.7  --  1.6  --   --   --   --   MONOABS 0.7  --  1.3*  --   --   --   --   EOSABS 0.0  --  0.1  --   --   --   --   BASOSABS 0.0  --  0.0  --   --   --   --   < > = values in this interval not displayed.  Chemistries   Recent Labs Lab 11/07/15 1228 11/07/15 1740  11/09/15 0605 11/10/15 0511 11/11/15 0439 11/12/15 0514 11/13/15 0445  NA 139  --   < > 140 136 140 142 138  K 3.8  --   < > 3.9 3.8 4.1 3.8 3.6  CL 114*  --   < > 116* 112* 114* 113* 111  CO2 18*  --   < > 17* 17* 17* 18* 18*  GLUCOSE 140*  --   < > 95 120* 103* 95 125*  BUN 19  --   < > 9 11 9 10 11   CREATININE 1.09*  --   < > 0.84 0.96 0.92 1.04* 1.01*  CALCIUM 8.1*  --   < > 8.3* 8.3* 8.9 8.7* 8.2*  MG  --  1.5*  --   --   --   --   --   --  AST 18  --   --  18 20  --   --   --   ALT 12*  --   --  11* 12*  --   --   --   ALKPHOS 107  --   --  72 75  --   --   --   BILITOT 0.4  --   --  0.7 0.5  --   --   --   < > = values in this interval not displayed. ------------------------------------------------------------------------------------------------------------------ estimated creatinine clearance is 39.3 mL/min (by C-G formula based on Cr of  1.01). ------------------------------------------------------------------------------------------------------------------ No results for input(s): HGBA1C in the last 72 hours. ------------------------------------------------------------------------------------------------------------------ No results for input(s): CHOL, HDL, LDLCALC, TRIG, CHOLHDL, LDLDIRECT in the last 72 hours. ------------------------------------------------------------------------------------------------------------------ No results for input(s): TSH, T4TOTAL, T3FREE, THYROIDAB in the last 72 hours.  Invalid input(s): FREET3 ------------------------------------------------------------------------------------------------------------------ No results for input(s): VITAMINB12, FOLATE, FERRITIN, TIBC, IRON, RETICCTPCT in the last 72 hours.  Coagulation profile  Recent Labs Lab 11/07/15 1740 11/08/15 0554 11/09/15 0605 11/10/15 0511 11/11/15 0439  INR 2.31* 2.48* 2.52* 1.49 1.20    No results for input(s): DDIMER in the last 72 hours.  Cardiac Enzymes No results for input(s): CKMB, TROPONINI, MYOGLOBIN in the last 168 hours.  Invalid input(s): CK ------------------------------------------------------------------------------------------------------------------ Invalid input(s): POCBNP    Boni Maclellan D.O. on 11/13/2015 at 11:43 AM  Between 7am to 7pm - Pager - [phone]  After 7pm go to [email or web address] - password [email or web address]  And look for the night coverage person covering for me after hours  [facility] Office  [phone]

## 2015-11-14 ENCOUNTER — Inpatient Hospital Stay (HOSPITAL_COMMUNITY): Payer: Medicare Other

## 2015-11-14 DIAGNOSIS — Z531 Procedure and treatment not carried out because of patient's decision for reasons of belief and group pressure: Secondary | ICD-10-CM

## 2015-11-14 DIAGNOSIS — R0600 Dyspnea, unspecified: Secondary | ICD-10-CM

## 2015-11-14 DIAGNOSIS — N179 Acute kidney failure, unspecified: Secondary | ICD-10-CM

## 2015-11-14 LAB — CUP PACEART REMOTE DEVICE CHECK
Battery Voltage: 2.8 V
Brady Statistic AP VS Percent: 64 %
Implantable Lead Implant Date: 20111027
Implantable Lead Location: 753859
Implantable Lead Model: 4470
Lead Channel Impedance Value: 430 Ohm
Lead Channel Impedance Value: 465 Ohm
Lead Channel Setting Pacing Pulse Width: 0.4 ms
MDC IDC LEAD IMPLANT DT: 20111027
MDC IDC LEAD LOCATION: 753860
MDC IDC LEAD SERIAL: 548229
MDC IDC LEAD SERIAL: 686026
MDC IDC MSMT BATTERY IMPEDANCE: 440 Ohm
MDC IDC MSMT BATTERY REMAINING LONGEVITY: 88 mo
MDC IDC SESS DTM: 20170302161652
MDC IDC SET LEADCHNL RA PACING AMPLITUDE: 2 V
MDC IDC SET LEADCHNL RV PACING AMPLITUDE: 2.5 V
MDC IDC SET LEADCHNL RV SENSING SENSITIVITY: 2 mV
MDC IDC STAT BRADY AP VP PERCENT: 0 %
MDC IDC STAT BRADY AS VP PERCENT: 0 %
MDC IDC STAT BRADY AS VS PERCENT: 36 %

## 2015-11-14 LAB — BASIC METABOLIC PANEL
Anion gap: 7 (ref 5–15)
BUN: 11 mg/dL (ref 6–20)
CHLORIDE: 112 mmol/L — AB (ref 101–111)
CO2: 20 mmol/L — AB (ref 22–32)
CREATININE: 1.05 mg/dL — AB (ref 0.44–1.00)
Calcium: 7.9 mg/dL — ABNORMAL LOW (ref 8.9–10.3)
GFR calc non Af Amer: 49 mL/min — ABNORMAL LOW (ref >60)
GFR, EST AFRICAN AMERICAN: 57 mL/min — AB (ref >60)
Glucose, Bld: 92 mg/dL (ref 65–99)
POTASSIUM: 3.3 mmol/L — AB (ref 3.5–5.1)
Sodium: 139 mmol/L (ref 135–145)

## 2015-11-14 LAB — CBC
HEMATOCRIT: 26 % — AB (ref 36.0–46.0)
HEMOGLOBIN: 8.2 g/dL — AB (ref 12.0–15.0)
MCH: 26.4 pg (ref 26.0–34.0)
MCHC: 31.5 g/dL (ref 30.0–36.0)
MCV: 83.6 fL (ref 78.0–100.0)
Platelets: 246 10*3/uL (ref 150–400)
RBC: 3.11 MIL/uL — ABNORMAL LOW (ref 3.87–5.11)
RDW: 16.4 % — ABNORMAL HIGH (ref 11.5–15.5)
WBC: 8.7 10*3/uL (ref 4.0–10.5)

## 2015-11-14 LAB — ECHOCARDIOGRAM COMPLETE
HEIGHTINCHES: 61 in
WEIGHTICAEL: 2327 [oz_av]

## 2015-11-14 LAB — H. PYLORI ANTIBODY, IGG

## 2015-11-14 MED ORDER — FUROSEMIDE 10 MG/ML IJ SOLN
40.0000 mg | Freq: Once | INTRAMUSCULAR | Status: AC
  Administered 2015-11-14: 40 mg via INTRAVENOUS
  Filled 2015-11-14: qty 4

## 2015-11-14 MED ORDER — POTASSIUM CHLORIDE CRYS ER 20 MEQ PO TBCR
40.0000 meq | EXTENDED_RELEASE_TABLET | Freq: Once | ORAL | Status: AC
  Administered 2015-11-14: 40 meq via ORAL
  Filled 2015-11-14: qty 2

## 2015-11-14 NOTE — Progress Notes (Signed)
[facility] Progress Note  3 Days Post-Op  Subjective: Pt doing well, has no pain, tolerating reg diet.  Patient says that she may get to go home today if her echo is okay.  She wants to have surgery once her pneumonia and blood counts are better.    Objective: Vital signs in last 24 hours: Temp:  [97.7 F (36.5 C)-98.9 F (37.2 C)] 98.3 F (36.8 C) (03/10 0549) Pulse Rate:  [90-109] 91 (03/10 0549) Resp:  [18-20] 18 (03/10 0549) BP: (133-156)/(54-91) 152/90 mmHg (03/10 0549) SpO2:  [96 %-100 %] 97 % (03/10 0549) Last BM Date: 11/13/15  Intake/Output from previous day: 03/09 0701 - 03/10 0700 In: 390 [P.O.:240; IV Piggyback:150] Out: -  Intake/Output this shift:    PE: Gen:  Alert, NAD, pleasant Card:  RRR, no M/G/R heard Pulm:  CTA, no W/R/R, good effort Abd: Soft, mild distension, minimally tender in her upper abdomen, +BS, no HSM   Lab Results:   Recent Labs  11/13/15 0445 11/14/15 0455  WBC 8.3 8.7  HGB 7.9* 8.2*  HCT 25.7* 26.0*  PLT 231 246   BMET  Recent Labs  11/13/15 0445 11/14/15 0455  NA 138 139  K 3.6 3.3*  CL 111 112*  CO2 18* 20*  GLUCOSE 125* 92  BUN 11 11  CREATININE 1.01* 1.05*  CALCIUM 8.2* 7.9*   PT/INR No results for input(s): LABPROT, INR in the last 72 hours. CMP     Component Value Date/Time   NA 139 11/14/2015 0455   K 3.3* 11/14/2015 0455   CL 112* 11/14/2015 0455   CO2 20* 11/14/2015 0455   GLUCOSE 92 11/14/2015 0455   BUN 11 11/14/2015 0455   CREATININE 1.05* 11/14/2015 0455   CREATININE 1.24* 10/09/2015 1156   CALCIUM 7.9* 11/14/2015 0455   PROT 5.7* 11/10/2015 0511   ALBUMIN 2.5* 11/10/2015 0511   AST 20 11/10/2015 0511   ALT 12* 11/10/2015 0511   ALKPHOS 75 11/10/2015 0511   BILITOT 0.5 11/10/2015 0511   GFRNONAA 49* 11/14/2015 0455   GFRNONAA 41* 10/09/2015 1156   GFRAA 57* 11/14/2015 0455   GFRAA 48* 10/09/2015 1156   Lipase     Component Value Date/Time   LIPASE 37 05/28/2014 2133        Studies/Results: Ct Chest W Contrast  11/12/2015  CLINICAL DATA:  Progressively worsening dyspnea. Subjective fever, chills and cough. Malaise and decreased oral intake. Gastric mass on endoscopy. EXAM: CT CHEST, ABDOMEN, AND PELVIS WITH CONTRAST TECHNIQUE: Multidetector CT imaging of the chest, abdomen and pelvis was performed following the standard protocol during bolus administration of intravenous contrast. CONTRAST:  43mL OMNIPAQUE IOHEXOL 300 MG/ML SOLN, 170mL OMNIPAQUE IOHEXOL 300 MG/ML SOLN COMPARISON:  Chest CT five days prior 11/07/2015. Most recent CT abdomen/ pelvis 05/31/2014 FINDINGS: CT CHEST FINDINGS Mediastinum/Lymph Nodes: Enlarged precarinal lymph node measures 1.5 cm, previously 1.1 cm. Additional small mediastinal and hilar lymph nodes are not enlarged by size criteria. Aneurysmal dilatation of the ascending aorta, 3.8 cm currently, differences likely secondary to caliper placement. No aortic dissection. Coronary artery calcifications are seen. Pacemaker in place. Mitral annulus calcifications. No pericardial effusion. Lungs/Pleura: Progression in bilateral pleural effusions, now moderate in degree. There is adjacent compressive atelectasis in both lower lobes. Calcified granuloma in the right lung is unchanged. Musculoskeletal: There are no acute or suspicious osseous abnormalities. Multilevel degenerative change in the thoracic spine. CT ABDOMEN PELVIS FINDINGS Hepatobiliary: No focal hepatic lesion. Postcholecystectomy with unchanged prominence of the common  bile duct measuring 11 mm. No calcified choledocholithiasis. Pancreas: No ductal dilatation or inflammation. Spleen: Normal in size and density, no focal abnormality. Adrenals/Urinary Tract: There is no adrenal nodule. Patient is post left nephrectomy. Homogeneous attenuation throughout the right kidney without hydronephrosis. Urinary bladder is minimally distended. Stomach/Bowel: Irregular wall thickening involving the  gastric fundus. More distal stomach is decompressed. No definite perigastric lymphadenopathy. There are no dilated or thickened bowel loops. Contrast and stool throughout the colon without abnormal distension. No bowel obstruction, oral contrast reaches the rectum. Appendix is not visualized. Vascular/Lymphatic: Dense atherosclerosis of the abdominal aorta without aneurysm. Small retroperitoneal lymph nodes, not enlarged by size criteria. Reproductive: Uterus is surgically absent. No gross adnexal mass, detailed pelvic evaluation partially obscured by streak artifact from right hip prosthesis. Other: Whole body wall edema. There is mild presacral edema, chronic. Post bilateral pelvic lymph node dissection. No free intra-abdominal air or free fluid. Musculoskeletal: Right hip arthroplasty. There are no acute or suspicious osseous abnormalities. Multilevel degenerative change in the lumbar spine. IMPRESSION: 1. Progressive bilateral pleural effusions since exam 5 days prior, now moderate in degree. There is adjacent compressive atelectasis in the lower lobes. 2. Minimal increased size in the prominent pretracheal lymph node, nonspecific, increased size likely reactive. 3. Irregular gastric wall thickening involving the fundus, concerning for malignancy. No definite perigastric adenopathy. No evidence of metastatic disease in the abdomen and pelvis. 4. Post left nephrectomy, cholecystectomy, and hysterectomy. Electronically Signed   By: Jeb Levering M.D.   On: 11/12/2015 23:55   Ct Abdomen Pelvis W Contrast  11/12/2015  CLINICAL DATA:  Progressively worsening dyspnea. Subjective fever, chills and cough. Malaise and decreased oral intake. Gastric mass on endoscopy. EXAM: CT CHEST, ABDOMEN, AND PELVIS WITH CONTRAST TECHNIQUE: Multidetector CT imaging of the chest, abdomen and pelvis was performed following the standard protocol during bolus administration of intravenous contrast. CONTRAST:  55mL OMNIPAQUE IOHEXOL  300 MG/ML SOLN, 134mL OMNIPAQUE IOHEXOL 300 MG/ML SOLN COMPARISON:  Chest CT five days prior 11/07/2015. Most recent CT abdomen/ pelvis 05/31/2014 FINDINGS: CT CHEST FINDINGS Mediastinum/Lymph Nodes: Enlarged precarinal lymph node measures 1.5 cm, previously 1.1 cm. Additional small mediastinal and hilar lymph nodes are not enlarged by size criteria. Aneurysmal dilatation of the ascending aorta, 3.8 cm currently, differences likely secondary to caliper placement. No aortic dissection. Coronary artery calcifications are seen. Pacemaker in place. Mitral annulus calcifications. No pericardial effusion. Lungs/Pleura: Progression in bilateral pleural effusions, now moderate in degree. There is adjacent compressive atelectasis in both lower lobes. Calcified granuloma in the right lung is unchanged. Musculoskeletal: There are no acute or suspicious osseous abnormalities. Multilevel degenerative change in the thoracic spine. CT ABDOMEN PELVIS FINDINGS Hepatobiliary: No focal hepatic lesion. Postcholecystectomy with unchanged prominence of the common bile duct measuring 11 mm. No calcified choledocholithiasis. Pancreas: No ductal dilatation or inflammation. Spleen: Normal in size and density, no focal abnormality. Adrenals/Urinary Tract: There is no adrenal nodule. Patient is post left nephrectomy. Homogeneous attenuation throughout the right kidney without hydronephrosis. Urinary bladder is minimally distended. Stomach/Bowel: Irregular wall thickening involving the gastric fundus. More distal stomach is decompressed. No definite perigastric lymphadenopathy. There are no dilated or thickened bowel loops. Contrast and stool throughout the colon without abnormal distension. No bowel obstruction, oral contrast reaches the rectum. Appendix is not visualized. Vascular/Lymphatic: Dense atherosclerosis of the abdominal aorta without aneurysm. Small retroperitoneal lymph nodes, not enlarged by size criteria. Reproductive: Uterus is  surgically absent. No gross adnexal mass, detailed pelvic evaluation partially obscured by streak  artifact from right hip prosthesis. Other: Whole body wall edema. There is mild presacral edema, chronic. Post bilateral pelvic lymph node dissection. No free intra-abdominal air or free fluid. Musculoskeletal: Right hip arthroplasty. There are no acute or suspicious osseous abnormalities. Multilevel degenerative change in the lumbar spine. IMPRESSION: 1. Progressive bilateral pleural effusions since exam 5 days prior, now moderate in degree. There is adjacent compressive atelectasis in the lower lobes. 2. Minimal increased size in the prominent pretracheal lymph node, nonspecific, increased size likely reactive. 3. Irregular gastric wall thickening involving the fundus, concerning for malignancy. No definite perigastric adenopathy. No evidence of metastatic disease in the abdomen and pelvis. 4. Post left nephrectomy, cholecystectomy, and hysterectomy. Electronically Signed   By: Jeb Levering M.D.   On: 11/12/2015 23:55    Anti-infectives: Anti-infectives    Start     Dose/Rate Route Frequency Ordered Stop   11/09/15 0600  vancomycin (VANCOCIN) 500 mg in sodium chloride 0.9 % 100 mL IVPB  Status:  Discontinued     500 mg 100 mL/hr over 60 Minutes Intravenous Every 12 hours 11/08/15 1538 11/11/15 2117   11/08/15 1600  piperacillin-tazobactam (ZOSYN) IVPB 3.375 g     3.375 g 12.5 mL/hr over 240 Minutes Intravenous Every 8 hours 11/08/15 1431     11/08/15 1600  vancomycin (VANCOCIN) IVPB 1000 mg/200 mL premix     1,000 mg 200 mL/hr over 60 Minutes Intravenous  Once 11/08/15 1536 11/08/15 1710   11/08/15 1400  azithromycin (ZITHROMAX) 500 mg in dextrose 5 % 250 mL IVPB  Status:  Discontinued     500 mg 250 mL/hr over 60 Minutes Intravenous Every 24 hours 11/07/15 1714 11/11/15 2131   11/08/15 1200  cefTRIAXone (ROCEPHIN) 2 g in dextrose 5 % 50 mL IVPB  Status:  Discontinued     2 g 100 mL/hr over 30  Minutes Intravenous Every 24 hours 11/07/15 1714 11/08/15 1431   11/07/15 1715  cefTRIAXone (ROCEPHIN) 1 g in dextrose 5 % 50 mL IVPB  Status:  Discontinued     1 g 100 mL/hr over 30 Minutes Intravenous  Once 11/07/15 1701 11/07/15 1715   11/07/15 1715  azithromycin (ZITHROMAX) 500 mg in dextrose 5 % 250 mL IVPB  Status:  Discontinued     500 mg 250 mL/hr over 60 Minutes Intravenous  Once 11/07/15 1701 11/07/15 1715   11/07/15 1330  azithromycin (ZITHROMAX) 500 mg in dextrose 5 % 250 mL IVPB     500 mg 250 mL/hr over 60 Minutes Intravenous  Once 11/07/15 1321 11/07/15 1530   11/07/15 1330  cefTRIAXone (ROCEPHIN) 1 g in dextrose 5 % 50 mL IVPB     1 g 100 mL/hr over 30 Minutes Intravenous  Once 11/07/15 1321 11/07/15 1421       Assessment/Plan Gastric Mass with bleeding with atypical glandular fragment, 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 -She wants to see Dr. Rayann Heman.  Pending Echo. Sepsis with pneumonia CAD/Aortic stenosis/Tachybrady syndrome with PTVP (Dr. 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

## 2015-11-14 NOTE — Progress Notes (Signed)
[facility]                                                                              Patient Demographics  [name], is a 82 y.o. female, DOB - 1933-11-19, [id number]  Admit date - 11/07/2015   Admitting Physician Theodis Blaze, MD  Outpatient Primary MD for the patient is [name], MD  LOS - 7   Chief Complaint  Patient presents with  . Fever      HPI on 11/07/2015 by Dr. Mart Piggs Pt is 82 yo female with multiple medical issues including PAF on chronic AC, HTN, Hypothyroidism, presented to [facility] ED with main concern of several days duration of progressively worsening dyspnea that initially started with exertion and has progressed to dyspnea at rest in the past 24 hours, associated with subjective fevers, chills, cough productive of yellow sputum, chest tightness that occurs with coughing spells, malaise, poor oral intake, wheezing, difficulty with ambulation due to dyspnea. She denies known sick contacts or exposures, no abd or urinary concerns, daughter called EMS and oxygen saturation noted to be 84% on RA.  In ED, pt noted to be hemodynamically stable but in mild distress due to dyspnea, VS notable for T 102 F, HR up to 127, RR > 22, WBC 15 K and lactic acid 2.13. CXR with no clear signs of infectious etiology. Pt started Rocephin and Zithromax and TRH asked to admit for further evaluation.   Assessment & Plan   Sepsis secondary to Strep viridans bactermia/Pneumonia -Upon admission, patient was febrile with leukocytosis, tachycardia and tachypnea and elevated lactic acid -Chest x-ray showed no active cardio pulmonary disease -Urine culture showed multiple species -Blood cultures from 11/07/2015: 1/2 strep viridans- likely a contaminant  -Repeat cultures 11/10/2015 negative to date -Initially placed on azithromycin and ceftriaxone, transitioned to Zosyn  -leukocytosis resolved -Strep pneumonia and legionella urine antigens negative  Iron deficiency  anemia/GI bleed/ Gastric mass -FOBT + -Gastroenterology consultation appreciated -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  Patient will need outpatient follow up with [name]. -Patient will also need cardiac workup, including stress test.  -Echocardiogram showed EF 60-65% -Hemoglobin 8.2 (patient is a Jehovah's witness) -Continue aranesp -H. Pylori IgG negative  Bilateral pleural effusions -Seen on CT chest, moderate -Possibly secondary to IVF received during admission for sepsis -CXR showed stable effusions -Continue lasix -Echocardiogram EF 60-65%  Right knee pain -s/p Right TKA -Continue pain control -PT consulted, recommended HH  Atrial fibrillation -CHADSVASC 4 (age, gender, HTN) -Continue atenolol -Coumadin held due to GI bleed  Hypothyroidism -Continue Synthroid  Hypertension -Continue atenolol  CKD, stage III -stable, continue to monitor BMP  Code Status: Full  Family Communication: Daughter at bedside  Disposition Plan: Admitted. Will give additional dose of lasix and continue to monitor breathing.  Time Spent in minutes   30 minutes  Procedures  EGD Colonoscopy   Consults   Gastroenterology  General surgery Oncology  DVT Prophylaxis  SCDs  Lab Results  Component Value Date   PLT 246 11/14/2015    Medications  Scheduled Meds: .  atenolol  100 mg Oral BID  . B-complex with vitamin C  1 tablet Oral Daily  . cyanocobalamin  1,000 mcg Subcutaneous Daily  . Darbepoetin Alfa  200 mcg Subcutaneous Q7 days  . furosemide  40 mg Intravenous Once  . guaiFENesin  1,200 mg Oral BID  . levothyroxine  100 mcg Oral QAC breakfast  . pantoprazole (PROTONIX) IV  40 mg Intravenous Q12H  . piperacillin-tazobactam  3.375 g Intravenous Q8H  . potassium chloride SA  20 mEq Oral BID  . potassium  chloride  40 mEq Oral Once  . pregabalin  75 mg Oral BID  . sertraline  50 mg Oral Daily  . triamcinolone cream   Topical TID   Continuous Infusions:  PRN Meds:.ALPRAZolam, guaiFENesin-dextromethorphan, levalbuterol, nitroGLYCERIN, ondansetron **OR** ondansetron (ZOFRAN) IV, traMADol  Antibiotics    Anti-infectives    Start     Dose/Rate Route Frequency Ordered Stop   11/09/15 0600  vancomycin (VANCOCIN) 500 mg in sodium chloride 0.9 % 100 mL IVPB  Status:  Discontinued     500 mg 100 mL/hr over 60 Minutes Intravenous Every 12 hours 11/08/15 1538 11/11/15 2117   11/08/15 1600  piperacillin-tazobactam (ZOSYN) IVPB 3.375 g     3.375 g 12.5 mL/hr over 240 Minutes Intravenous Every 8 hours 11/08/15 1431     11/08/15 1600  vancomycin (VANCOCIN) IVPB 1000 mg/200 mL premix     1,000 mg 200 mL/hr over 60 Minutes Intravenous  Once 11/08/15 1536 11/08/15 1710   11/08/15 1400  azithromycin (ZITHROMAX) 500 mg in dextrose 5 % 250 mL IVPB  Status:  Discontinued     500 mg 250 mL/hr over 60 Minutes Intravenous Every 24 hours 11/07/15 1714 11/11/15 2131   11/08/15 1200  cefTRIAXone (ROCEPHIN) 2 g in dextrose 5 % 50 mL IVPB  Status:  Discontinued     2 g 100 mL/hr over 30 Minutes Intravenous Every 24 hours 11/07/15 1714 11/08/15 1431   11/07/15 1715  cefTRIAXone (ROCEPHIN) 1 g in dextrose 5 % 50 mL IVPB  Status:  Discontinued     1 g 100 mL/hr over 30 Minutes Intravenous  Once 11/07/15 1701 11/07/15 1715   11/07/15 1715  azithromycin (ZITHROMAX) 500 mg in dextrose 5 % 250 mL IVPB  Status:  Discontinued     500 mg 250 mL/hr over 60 Minutes Intravenous  Once 11/07/15 1701 11/07/15 1715   11/07/15 1330  azithromycin (ZITHROMAX) 500 mg in dextrose 5 % 250 mL IVPB     500 mg 250 mL/hr over 60 Minutes Intravenous  Once 11/07/15 1321 11/07/15 1530   11/07/15 1330  cefTRIAXone (ROCEPHIN) 1 g in dextrose 5 % 50 mL IVPB     1 g 100 mL/hr over 30 Minutes Intravenous  Once 11/07/15 1321 11/07/15 1421       Subjective:   [name] seen and examined today.  Patient states she is ok today. Feels her breathing has improved. She was able to urinate "a lot yesterday".  Denies any chest pain,  abdominal pain, nausea or vomiting, diarrhea or constipation.   Objective:   Filed Vitals:   11/13/15 2139 11/13/15 2300 11/14/15 0549 11/14/15 1253  BP: 155/87 141/64 152/90 149/86  Pulse: 109 90 91 110  Temp: 97.7 F (36.5 C)  98.3 F (36.8 C) 98.6 F (37 C)  TempSrc: Oral   Oral  Resp: 20  18 18   Height:      Weight:      SpO2: 96%  97%  97%    Wt Readings from Last 3 Encounters:  11/11/15 65.97 kg (145 lb 7 oz)  10/14/15 68.221 kg (150 lb 6.4 oz)  10/09/15 67.586 kg (149 lb)     Intake/Output Summary (Last 24 hours) at 11/14/15 1446 Last data filed at 11/14/15 0027  Gross per 24 hour  Intake    340 ml  Output      0 ml  Net    340 ml    Exam  General: Well developed, well nourished, NAD  HEENT: NCAT, mucous membranes moist  Cardiovascular: S1 S2 auscultated, IRR  Respiratory: Diminished but clear.  No wheezing or rales notes.  Abdomen: Soft, nontender, nondistended, + bowel sounds  Extremities: warm dry without cyanosis clubbing or edema  Neuro: AAOx3, nonfocal  Psych: Normal affect and demeanor, pleasant.   Data Review   Micro Results Recent Results (from the past 240 hour(s))  Culture, blood (routine x 2)     Status: None   Collection Time: 11/07/15 12:28 PM  Result Value Ref Range Status   Specimen Description BLOOD RIGHT ANTECUBITAL  Final   Special Requests IN PEDIATRIC BOTTLE 4ML  Final   Culture   Final    NO GROWTH 5 DAYS Performed at [facility]    Report Status 11/12/2015 FINAL  Final  Culture, blood (routine x 2)     Status: None   Collection Time: 11/07/15 12:29 PM  Result Value Ref Range Status   Specimen Description BLOOD LEFT HAND  Final   Special Requests IN PEDIATRIC BOTTLE 2ML  Final   Culture  Setup Time   Final    GRAM  POSITIVE COCCI IN CHAINS IN PEDIATRIC BOTTLE CRITICAL RESULT CALLED TO, READ BACK BY AND VERIFIED WITH: C ADDISON,RN @[id number]  11/08/15 MKELLY    Culture   Final    VIRIDANS STREPTOCOCCUS THE SIGNIFICANCE OF ISOLATING THIS ORGANISM FROM A SINGLE SET OF BLOOD CULTURES WHEN MULTIPLE SETS ARE DRAWN IS UNCERTAIN. PLEASE NOTIFY THE MICROBIOLOGY DEPARTMENT WITHIN ONE WEEK IF SPECIATION AND SENSITIVITIES ARE REQUIRED. Performed at [facility]    Report Status 11/11/2015 FINAL  Final  Urine culture     Status: None   Collection Time: 11/07/15  4:35 PM  Result Value Ref Range Status   Specimen Description URINE, CLEAN CATCH  Final   Special Requests NONE  Final   Culture   Final    MULTIPLE SPECIES PRESENT, SUGGEST RECOLLECTION Performed at [facility]    Report Status 11/09/2015 FINAL  Final  Culture, sputum-assessment     Status: None   Collection Time: 11/07/15  8:42 PM  Result Value Ref Range Status   Specimen Description SPUTUM  Final   Special Requests NONE  Final   Sputum evaluation   Final    THIS SPECIMEN IS ACCEPTABLE. RESPIRATORY CULTURE REPORT TO FOLLOW.   Report Status 11/07/2015 FINAL  Final  Culture, respiratory (NON-Expectorated)     Status: None   Collection Time: 11/07/15  8:42 PM  Result Value Ref Range Status   Specimen Description SPUTUM  Final   Special Requests NONE  Final   Gram Stain   Final    ABUNDANT WBC PRESENT, PREDOMINANTLY PMN FEW SQUAMOUS EPITHELIAL CELLS PRESENT MODERATE GRAM POSITIVE COCCI IN PAIRS IN CLUSTERS FEW GRAM POSITIVE RODS THIS SPECIMEN IS ACCEPTABLE FOR SPUTUM CULTURE Performed at [facility]    Culture   Final    NORMAL OROPHARYNGEAL FLORA Performed at [facility]  Report Status 11/10/2015 FINAL  Final  Culture, blood (routine x 2)     Status: None (Preliminary result)   Collection Time: 11/10/15  5:10 AM  Result Value Ref Range Status   Specimen Description BLOOD LEFT ARM  Final   Special Requests  IN PEDIATRIC BOTTLE 3ML  Final   Culture   Final    NO GROWTH 4 DAYS Performed at [facility]    Report Status PENDING  Incomplete  Culture, blood (routine x 2)     Status: None (Preliminary result)   Collection Time: 11/10/15  8:00 AM  Result Value Ref Range Status   Specimen Description BLOOD LEFT ARM  Final   Special Requests BOTTLES DRAWN AEROBIC AND ANAEROBIC [place]  Final   Culture   Final    NO GROWTH 4 DAYS Performed at [facility]    Report Status PENDING  Incomplete    Radiology Reports Dg Chest 2 View  11/14/2015  CLINICAL DATA:  Shortness of breath and pleural effusions EXAM: CHEST  2 VIEW COMPARISON:  11/12/2015 FINDINGS: Cardiac shadow remains enlarged. A pacing device is again seen. Bilateral pleural effusions left greater than right are noted. No focal infiltrate is seen. No bony abnormality is noted. IMPRESSION: Stable bilateral pleural effusions. Electronically Signed   By: Inez Catalina M.D.   On: 11/14/2015 14:20   Dg Chest 2 View  11/07/2015  CLINICAL DATA:  Productive cough with green sputum, shortness of breath and fever for 5 days. EXAM: CHEST  2 VIEW COMPARISON:  08/02/2014 FINDINGS: Stable appearance of the right dual chamber cardiac pacemaker. Heart size is within normal limits. Aortic arch is calcified. Lungs are clear without airspace disease or pulmonary edema. No acute bone abnormality. No pleural effusions. IMPRESSION: No active cardiopulmonary disease. Electronically Signed   By: Markus Daft M.D.   On: 11/07/2015 13:13   Dg Knee 1-2 Views Right  11/08/2015  CLINICAL DATA:  Pain for 2 days. No recent trauma. Total knee replacement 3 years ago. EXAM: RIGHT KNEE - 1-2 VIEW COMPARISON:  None. FINDINGS: The patient is status post knee replacement. Hardware is in good position with no loosening or failure identified. Surgical screws and metallic tacks project over the proximal tibia, also in good position. No joint effusion. Mild anterior soft tissue  swelling is seen. No bony erosion or bony lesions. No fractures are seen. IMPRESSION: Mild anterior soft tissue swelling.  No other abnormalities. Electronically Signed   By: Dorise Bullion III M.D   On: 11/08/2015 16:00   Ct Chest Wo Contrast  11/07/2015  CLINICAL DATA:  Lethargy, fever, URI symptoms, increased coughing, shortness of breath,, weakness, fever and chills today, hypoxemia with 84% oxygen saturation on room air, history coronary artery disease, CHF, paroxysmal atrial fibrillation, hypertension, hyperlipidemia EXAM: CT CHEST WITHOUT CONTRAST TECHNIQUE: Multidetector CT imaging of the chest was performed following the standard protocol without IV contrast. Sagittal and coronal MPR images reconstructed from axial data set. COMPARISON:  08/03/2014 FINDINGS: Beam hardening artifacts from pacemaker generator upper RIGHT chest. Extensive atherosclerotic calcifications aorta, proximal great vessels, and coronary arteries. Aneurysmal dilatation ascending thoracic aorta 4.1 cm transverse image 25. Minimally enlarged precarinal lymph node 11 mm short axis image 21. Additional scattered normal size thoracic nodes. Gallbladder surgically absent. Visualized upper abdomen otherwise unremarkable. Central peribronchial thickening. Dependent atelectasis in the posterior lungs bilaterally. Tiny LEFT pleural effusion. Calcified granuloma RIGHT upper lobe image 22. No acute infiltrate, pleural effusion, or pneumothorax. Bones demineralized with scattered  degenerative disc disease changes thoracic spine. IMPRESSION: Bronchitic changes with bibasilar atelectasis. Single nonspecific minimally enlarged precarinal lymph node. Extensive atherosclerotic calcification with aneurysmal dilatation of the ascending thoracic aorta; recommendation below. Recommend annual imaging followup by CTA or MRA. This recommendation follows 2010 ACCF/AHA/AATS/ACR/ASA/SCA/SCAI/SIR/STS/SVM Guidelines for the Diagnosis and Management of Patients  with Thoracic Aortic Disease. Circulation. 2010; [id number] Electronically Signed   By: Lavonia Dana M.D.   On: 11/07/2015 19:41   Ct Chest W Contrast  11/12/2015  CLINICAL DATA:  Progressively worsening dyspnea. Subjective fever, chills and cough. Malaise and decreased oral intake. Gastric mass on endoscopy. EXAM: CT CHEST, ABDOMEN, AND PELVIS WITH CONTRAST TECHNIQUE: Multidetector CT imaging of the chest, abdomen and pelvis was performed following the standard protocol during bolus administration of intravenous contrast. CONTRAST:  42mL OMNIPAQUE IOHEXOL 300 MG/ML SOLN, 132mL OMNIPAQUE IOHEXOL 300 MG/ML SOLN COMPARISON:  Chest CT five days prior 11/07/2015. Most recent CT abdomen/ pelvis 05/31/2014 FINDINGS: CT CHEST FINDINGS Mediastinum/Lymph Nodes: Enlarged precarinal lymph node measures 1.5 cm, previously 1.1 cm. Additional small mediastinal and hilar lymph nodes are not enlarged by size criteria. Aneurysmal dilatation of the ascending aorta, 3.8 cm currently, differences likely secondary to caliper placement. No aortic dissection. Coronary artery calcifications are seen. Pacemaker in place. Mitral annulus calcifications. No pericardial effusion. Lungs/Pleura: Progression in bilateral pleural effusions, now moderate in degree. There is adjacent compressive atelectasis in both lower lobes. Calcified granuloma in the right lung is unchanged. Musculoskeletal: There are no acute or suspicious osseous abnormalities. Multilevel degenerative change in the thoracic spine. CT ABDOMEN PELVIS FINDINGS Hepatobiliary: No focal hepatic lesion. Postcholecystectomy with unchanged prominence of the common bile duct measuring 11 mm. No calcified choledocholithiasis. Pancreas: No ductal dilatation or inflammation. Spleen: Normal in size and density, no focal abnormality. Adrenals/Urinary Tract: There is no adrenal nodule. Patient is post left nephrectomy. Homogeneous attenuation throughout the right kidney without  hydronephrosis. Urinary bladder is minimally distended. Stomach/Bowel: Irregular wall thickening involving the gastric fundus. More distal stomach is decompressed. No definite perigastric lymphadenopathy. There are no dilated or thickened bowel loops. Contrast and stool throughout the colon without abnormal distension. No bowel obstruction, oral contrast reaches the rectum. Appendix is not visualized. Vascular/Lymphatic: Dense atherosclerosis of the abdominal aorta without aneurysm. Small retroperitoneal lymph nodes, not enlarged by size criteria. Reproductive: Uterus is surgically absent. No gross adnexal mass, detailed pelvic evaluation partially obscured by streak artifact from right hip prosthesis. Other: Whole body wall edema. There is mild presacral edema, chronic. Post bilateral pelvic lymph node dissection. No free intra-abdominal air or free fluid. Musculoskeletal: Right hip arthroplasty. There are no acute or suspicious osseous abnormalities. Multilevel degenerative change in the lumbar spine. IMPRESSION: 1. Progressive bilateral pleural effusions since exam 5 days prior, now moderate in degree. There is adjacent compressive atelectasis in the lower lobes. 2. Minimal increased size in the prominent pretracheal lymph node, nonspecific, increased size likely reactive. 3. Irregular gastric wall thickening involving the fundus, concerning for malignancy. No definite perigastric adenopathy. No evidence of metastatic disease in the abdomen and pelvis. 4. Post left nephrectomy, cholecystectomy, and hysterectomy. Electronically Signed   By: Jeb Levering M.D.   On: 11/12/2015 23:55   Ct Abdomen Pelvis W Contrast  11/12/2015  CLINICAL DATA:  Progressively worsening dyspnea. Subjective fever, chills and cough. Malaise and decreased oral intake. Gastric mass on endoscopy. EXAM: CT CHEST, ABDOMEN, AND PELVIS WITH CONTRAST TECHNIQUE: Multidetector CT imaging of the chest, abdomen and pelvis was performed following  the standard protocol  during bolus administration of intravenous contrast. CONTRAST:  73mL OMNIPAQUE IOHEXOL 300 MG/ML SOLN, 122mL OMNIPAQUE IOHEXOL 300 MG/ML SOLN COMPARISON:  Chest CT five days prior 11/07/2015. Most recent CT abdomen/ pelvis 05/31/2014 FINDINGS: CT CHEST FINDINGS Mediastinum/Lymph Nodes: Enlarged precarinal lymph node measures 1.5 cm, previously 1.1 cm. Additional small mediastinal and hilar lymph nodes are not enlarged by size criteria. Aneurysmal dilatation of the ascending aorta, 3.8 cm currently, differences likely secondary to caliper placement. No aortic dissection. Coronary artery calcifications are seen. Pacemaker in place. Mitral annulus calcifications. No pericardial effusion. Lungs/Pleura: Progression in bilateral pleural effusions, now moderate in degree. There is adjacent compressive atelectasis in both lower lobes. Calcified granuloma in the right lung is unchanged. Musculoskeletal: There are no acute or suspicious osseous abnormalities. Multilevel degenerative change in the thoracic spine. CT ABDOMEN PELVIS FINDINGS Hepatobiliary: No focal hepatic lesion. Postcholecystectomy with unchanged prominence of the common bile duct measuring 11 mm. No calcified choledocholithiasis. Pancreas: No ductal dilatation or inflammation. Spleen: Normal in size and density, no focal abnormality. Adrenals/Urinary Tract: There is no adrenal nodule. Patient is post left nephrectomy. Homogeneous attenuation throughout the right kidney without hydronephrosis. Urinary bladder is minimally distended. Stomach/Bowel: Irregular wall thickening involving the gastric fundus. More distal stomach is decompressed. No definite perigastric lymphadenopathy. There are no dilated or thickened bowel loops. Contrast and stool throughout the colon without abnormal distension. No bowel obstruction, oral contrast reaches the rectum. Appendix is not visualized. Vascular/Lymphatic: Dense atherosclerosis of the abdominal aorta  without aneurysm. Small retroperitoneal lymph nodes, not enlarged by size criteria. Reproductive: Uterus is surgically absent. No gross adnexal mass, detailed pelvic evaluation partially obscured by streak artifact from right hip prosthesis. Other: Whole body wall edema. There is mild presacral edema, chronic. Post bilateral pelvic lymph node dissection. No free intra-abdominal air or free fluid. Musculoskeletal: Right hip arthroplasty. There are no acute or suspicious osseous abnormalities. Multilevel degenerative change in the lumbar spine. IMPRESSION: 1. Progressive bilateral pleural effusions since exam 5 days prior, now moderate in degree. There is adjacent compressive atelectasis in the lower lobes. 2. Minimal increased size in the prominent pretracheal lymph node, nonspecific, increased size likely reactive. 3. Irregular gastric wall thickening involving the fundus, concerning for malignancy. No definite perigastric adenopathy. No evidence of metastatic disease in the abdomen and pelvis. 4. Post left nephrectomy, cholecystectomy, and hysterectomy. Electronically Signed   By: Jeb Levering M.D.   On: 11/12/2015 23:55    CBC  Recent Labs Lab 11/09/15 0605 11/10/15 0511 11/11/15 0439 11/12/15 0514 11/13/15 0445 11/14/15 0455  WBC 12.8* 11.3* 10.7* 9.5 8.3 8.7  HGB 7.9* 8.3* 8.7* 8.0* 7.9* 8.2*  HCT 25.6* 26.8* 28.1* 26.0* 25.7* 26.0*  PLT 179 202 234 208 231 246  MCV 86.8 86.7 86.2 85.2 86.8 83.6  MCH 26.8 26.9 26.7 26.2 26.7 26.4  MCHC 30.9 31.0 31.0 30.8 30.7 31.5  RDW 15.6* 15.5 15.5 15.5 15.9* 16.4*  LYMPHSABS 1.6  --   --   --   --   --   MONOABS 1.3*  --   --   --   --   --   EOSABS 0.1  --   --   --   --   --   BASOSABS 0.0  --   --   --   --   --     Chemistries   Recent Labs Lab 11/07/15 1740  11/09/15 0605 11/10/15 0511 11/11/15 0439 11/12/15 0514 11/13/15 0445 11/14/15 0455  NA  --   < >  140 136 140 142 138 139  K  --   < > 3.9 3.8 4.1 3.8 3.6 3.3*  CL  --    < > 116* 112* 114* 113* 111 112*  CO2  --   < > 17* 17* 17* 18* 18* 20*  GLUCOSE  --   < > 95 120* 103* 95 125* 92  BUN  --   < > 9 11 9 10 11 11   CREATININE  --   < > 0.84 0.96 0.92 1.04* 1.01* 1.05*  CALCIUM  --   < > 8.3* 8.3* 8.9 8.7* 8.2* 7.9*  MG 1.5*  --   --   --   --   --   --   --   AST  --   --  18 20  --   --   --   --   ALT  --   --  11* 12*  --   --   --   --   ALKPHOS  --   --  72 75  --   --   --   --   BILITOT  --   --  0.7 0.5  --   --   --   --   < > = values in this interval not displayed. ------------------------------------------------------------------------------------------------------------------ estimated creatinine clearance is 37.8 mL/min (by C-G formula based on Cr of 1.05). ------------------------------------------------------------------------------------------------------------------ No results for input(s): HGBA1C in the last 72 hours. ------------------------------------------------------------------------------------------------------------------ No results for input(s): CHOL, HDL, LDLCALC, TRIG, CHOLHDL, LDLDIRECT in the last 72 hours. ------------------------------------------------------------------------------------------------------------------ No results for input(s): TSH, T4TOTAL, T3FREE, THYROIDAB in the last 72 hours.  Invalid input(s): FREET3 ------------------------------------------------------------------------------------------------------------------ No results for input(s): VITAMINB12, FOLATE, FERRITIN, TIBC, IRON, RETICCTPCT in the last 72 hours.  Coagulation profile  Recent Labs Lab 11/07/15 1740 11/08/15 0554 11/09/15 0605 11/10/15 0511 11/11/15 0439  INR 2.31* 2.48* 2.52* 1.49 1.20    No results for input(s): DDIMER in the last 72 hours.  Cardiac Enzymes No results for input(s): CKMB, TROPONINI, MYOGLOBIN in the last 168 hours.  Invalid input(s):  CK ------------------------------------------------------------------------------------------------------------------ Invalid input(s): POCBNP    Tayo Maute D.O. on 11/14/2015 at 2:46 PM  Between 7am to 7pm - Pager - [phone]  After 7pm go to [email or web address] - password [email or web address]  And look for the night coverage person covering for me after hours  [facility] Office  [phone]

## 2015-11-14 NOTE — Progress Notes (Signed)
PT Cancellation Note  Patient Details Name: [name] MRN: [id number] DOB: March 10, 1934   Cancelled Treatment:    Reason Eval/Treat Not Completed: Patient at procedure or test/unavailable. Possible d/c later today depending on results per family. Will check back another day if pt remains in hospital.   Weston Anna, MPT Pager: [phone]

## 2015-11-14 NOTE — Progress Notes (Signed)
Normal remote reviewed. 35% AF, +Warfarin. AF burden was 3% 02/2015  Next follow up 02/2016, need to discuss need for rhythm control.

## 2015-11-14 NOTE — Care Management Important Message (Signed)
Important Message  Patient Details  Name: [name] MRN: [id number] Date of Birth: [birth year]   Medicare Important Message Given:  Yes    Camillo Flaming 11/14/2015, 2:00 [place] Message  Patient Details  Name: [name] MRN: [id number] Date of Birth: May 26, 1934   Medicare Important Message Given:  Yes    Camillo Flaming 11/14/2015, 2:00 PM

## 2015-11-14 NOTE — Progress Notes (Signed)
Quick Note:  Reviewed w/ patient in hospital - no letter/recall Was seen by surgery  To get iron Tx and medical care before surgery would be done - later She is off anticoags ______

## 2015-11-14 NOTE — Progress Notes (Signed)
Echocardiogram 2D Echocardiogram has been performed.  Tresa Res 11/14/2015, 11:49 AM

## 2015-11-14 NOTE — Care Management Note (Signed)
Case Management Note  Patient Details  Name: JENNFER SONIA MRN: [id number] Date of Birth: [birth year]  Subjective/Objective:  [facility] aware of HHPT.                  Action/Plan:d/c home w/HHC.   Expected Discharge Date:   (UNKNOWN)               Expected Discharge Plan:  [place]  In-House Referral:     Discharge planning Services  CM Consult  Post Acute Care Choice:    Choice offered to:  Adult Children  DME Arranged:    DME Agency:     HH Arranged:  PT HH Agency:  [facility]  Status of Service:  Completed, signed off  Medicare Important Message Given:  Yes Date Medicare IM Given:    Medicare IM give by:    Date Additional Medicare IM Given:    Additional Medicare Important Message give by:     If discussed at [place] of Stay Meetings, dates discussed:    Additional Comments:  Dessa Phi, RN 11/14/2015, 10:12 AM

## 2015-11-15 LAB — CBC
HEMATOCRIT: 27.8 % — AB (ref 36.0–46.0)
HEMOGLOBIN: 8.6 g/dL — AB (ref 12.0–15.0)
MCH: 27.3 pg (ref 26.0–34.0)
MCHC: 30.9 g/dL (ref 30.0–36.0)
MCV: 88.3 fL (ref 78.0–100.0)
Platelets: 286 10*3/uL (ref 150–400)
RBC: 3.15 MIL/uL — ABNORMAL LOW (ref 3.87–5.11)
RDW: 17 % — ABNORMAL HIGH (ref 11.5–15.5)
WBC: 8.6 10*3/uL (ref 4.0–10.5)

## 2015-11-15 LAB — BASIC METABOLIC PANEL
Anion gap: 9 (ref 5–15)
BUN: 9 mg/dL (ref 6–20)
CHLORIDE: 112 mmol/L — AB (ref 101–111)
CO2: 23 mmol/L (ref 22–32)
CREATININE: 1.08 mg/dL — AB (ref 0.44–1.00)
Calcium: 8.8 mg/dL — ABNORMAL LOW (ref 8.9–10.3)
GFR calc non Af Amer: 48 mL/min — ABNORMAL LOW (ref >60)
GFR, EST AFRICAN AMERICAN: 55 mL/min — AB (ref >60)
Glucose, Bld: 93 mg/dL (ref 65–99)
Potassium: 3.9 mmol/L (ref 3.5–5.1)
Sodium: 144 mmol/L (ref 135–145)

## 2015-11-15 LAB — CULTURE, BLOOD (ROUTINE X 2)
CULTURE: NO GROWTH
Culture: NO GROWTH

## 2015-11-15 MED ORDER — CYANOCOBALAMIN 1000 MCG/ML IJ SOLN: 1000.0000 ug | Freq: Every day | INTRAMUSCULAR | Status: DC

## 2015-11-15 MED ORDER — CYANOCOBALAMIN 1000 MCG/ML IJ SOLN: 1000.0000 ug | Freq: Every day | INTRAMUSCULAR | Status: AC

## 2015-11-15 MED ORDER — SUCRALFATE 1 G PO TABS
1.0000 g | ORAL_TABLET | Freq: Three times a day (TID) | ORAL | Status: DC
Start: 2015-11-15 — End: 2015-12-11

## 2015-11-15 MED ORDER — FUROSEMIDE 40 MG PO TABS: 40.0000 mg | ORAL_TABLET | Freq: Every day | ORAL | Status: DC

## 2015-11-15 MED ORDER — PANTOPRAZOLE SODIUM 40 MG PO TBEC: 40.0000 mg | DELAYED_RELEASE_TABLET | Freq: Two times a day (BID) | ORAL | Status: DC

## 2015-11-15 MED ORDER — FUROSEMIDE 10 MG/ML IJ SOLN
40.0000 mg | Freq: Every day | INTRAMUSCULAR | Status: DC
  Administered 2015-11-15: 40 mg via INTRAVENOUS
  Filled 2015-11-15: qty 4

## 2015-11-15 MED ORDER — GUAIFENESIN ER 600 MG PO TB12: 1200.0000 mg | ORAL_TABLET | Freq: Two times a day (BID) | ORAL | Status: DC

## 2015-11-15 MED ORDER — B COMPLEX-C PO TABS: 1.0000 | ORAL_TABLET | Freq: Every day | ORAL | Status: DC

## 2015-11-15 NOTE — Discharge Instructions (Signed)
Anemia, Nonspecific Anemia is a condition in which the concentration of red blood cells or hemoglobin in the blood is below normal. Hemoglobin is a substance in red blood cells that carries oxygen to the tissues of the body. Anemia results in not enough oxygen reaching these tissues.  CAUSES  Common causes of anemia include:   Excessive bleeding. Bleeding may be internal or external. This includes excessive bleeding from periods (in women) or from the intestine.   Poor nutrition.   Chronic kidney, thyroid, and liver disease.  Bone marrow disorders that decrease red blood cell production.  Cancer and treatments for cancer.  HIV, AIDS, and their treatments.  Spleen problems that increase red blood cell destruction.  Blood disorders.  Excess destruction of red blood cells due to infection, medicines, and autoimmune disorders. SIGNS AND SYMPTOMS   Minor weakness.   Dizziness.   Headache.  Palpitations.   Shortness of breath, especially with exercise.   Paleness.  Cold sensitivity.  Indigestion.  Nausea.  Difficulty sleeping.  Difficulty concentrating. Symptoms may occur suddenly or they may develop slowly.  DIAGNOSIS  Additional blood tests are often needed. These help your health care provider determine the best treatment. Your health care provider will check your stool for blood and look for other causes of blood loss.  TREATMENT  Treatment varies depending on the cause of the anemia. Treatment can include:   Supplements of iron, vitamin B12, or folic acid.   Hormone medicines.   A blood transfusion. This may be needed if blood loss is severe.   Hospitalization. This may be needed if there is significant continual blood loss.   Dietary changes.  Spleen removal. HOME CARE INSTRUCTIONS Keep all follow-up appointments. It often takes many weeks to correct anemia, and having your health care provider check on your condition and your response to  treatment is very important. SEEK IMMEDIATE MEDICAL CARE IF:   You develop extreme weakness, shortness of breath, or chest pain.   You become dizzy or have trouble concentrating.  You develop heavy vaginal bleeding.   You develop a rash.   You have bloody or black, tarry stools.   You faint.   You vomit up blood.   You vomit repeatedly.   You have abdominal pain.  You have a fever or persistent symptoms for more than 2-3 days.   You have a fever and your symptoms suddenly get worse.   You are dehydrated.  MAKE SURE YOU:  Understand these instructions.  Will watch your condition.  Will get help right away if you are not doing well or get worse.   This information is not intended to replace advice given to you by your health care provider. Make sure you discuss any questions you have with your health care provider.   Document Released: 09/30/2004 Document Revised: 04/25/2013 Document Reviewed: 02/16/2013 [employer] Interactive Patient Education 2016 [employer]  

## 2015-11-15 NOTE — Discharge Summary (Signed)
Physician Discharge Summary  [name] [id number] DOB: 06/04/1934 DOA: 11/07/2015  PCP: Valerie Richards, MD  Admit date: 11/07/2015 Discharge date: 11/15/2015  Time spent: 45 minutes  Recommendations for Outpatient Follow-up:  Patient will be discharged to home with home health PT.  Patient will need to follow up with primary care provider within one week of discharge.  Follow up with Dr. Irene [name], oncologist, within one week.  Follow up with Dr. Rayann [name] or Dr. [name] for cardiac workup before surgery.   Patient should continue medications as prescribed.  Patient should follow a heart healthy diet.   Discharge Diagnoses Sepsis secondary to Strep viridans bactermia/Pneumonia Iron deficiency anemia/GI bleed/ Gastric mass Bilateral pleural effusions Right knee pain Atrial fibrillation Hypothyroidism Hypertension CKD, stage III  Discharge Condition: Stable  Diet recommendation: heart healthy  Filed Weights   11/07/15 1700 11/11/15 1349  Weight: 68.8 kg (151 lb 10.8 oz) 65.97 kg (145 lb 7 oz)    History of present illness:  on 11/07/2015 by Dr. Mart Piggs Pt is 82 yo female with multiple medical issues including PAF on chronic AC, HTN, Hypothyroidism, presented to [facility] ED with main concern of several days duration of progressively worsening dyspnea that initially started with exertion and has progressed to dyspnea at rest in the past 24 hours, associated with subjective fevers, chills, cough productive of yellow sputum, chest tightness that occurs with coughing spells, malaise, poor oral intake, wheezing, difficulty with ambulation due to dyspnea. She denies known sick contacts or exposures, no abd or urinary concerns, daughter called EMS and oxygen saturation noted to be 84% on RA.  In ED, pt noted to be hemodynamically stable but in mild distress due to dyspnea, VS notable for T 102 F, HR up to 127, RR > 22, WBC 15 K and lactic acid 2.13. CXR with no clear signs of infectious  etiology. Pt started Rocephin and Zithromax and TRH asked to admit for further evaluation.   Hospital Course:  Sepsis secondary to Strep viridans bactermia/Pneumonia -Upon admission, patient was febrile with leukocytosis, tachycardia and tachypnea and elevated lactic acid -Chest x-ray showed no active cardio pulmonary disease -Urine culture showed multiple species -Blood cultures from 11/07/2015: 1/2 strep viridans- likely a contaminant  -Repeat cultures 11/10/2015 negative to date -Initially placed on azithromycin and ceftriaxone, transitioned to Zosyn  -leukocytosis resolved -Strep pneumonia and legionella urine antigens negative  Iron deficiency anemia/GI bleed/ Gastric mass -FOBT + -Gastroenterology consultation appreciated -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 Patient will need outpatient follow up with Dr. Barry [name]. -Patient will also need cardiac workup, including stress test.  -Echocardiogram showed EF 60-65% -Hemoglobin 8.6 (patient is a Jehovah's witness) -Was given aranesp -H. Pylori IgG negative -Oncology recommended B12 injections daily for one week, then followed weekly for one month, then monthly, carafate QID.  Will need outpatient follow with Dr. Irene [name] in one week.  Bilateral pleural effusions -Seen on CT chest, moderate -Possibly secondary to IVF received during admission for sepsis -CXR showed stable effusions -Continue lasix- will increase home dose to 40mg  daily.  She will need close follow with cardiology. -Echocardiogram EF 60-65%  Right knee pain -s/p Right TKA -Continue pain control -PT consulted, recommended HH  Atrial fibrillation -CHADSVASC 4 (age, gender, HTN) -Continue atenolol -Coumadin held due to GI bleed  Hypothyroidism -Continue Synthroid  Hypertension -Continue  atenolol  CKD, stage III -stable, continue to monitor BMP  Procedures  EGD Colonoscopy   Consults  Gastroenterology  General surgery Oncology  Discharge Exam: Filed Vitals:   11/14/15 2148 11/15/15 0556  BP: 146/79 143/80  Pulse: 89 94  Temp: 98 F (36.7 C) 98.2 F (36.8 C)  Resp: 16 18   Exam  General: Well developed, well nourished, NAD  HEENT: NCAT, mucous membranes moist  Cardiovascular: S1 S2 auscultated, IRR  Respiratory: Clear to auscultation.  Abdomen: Soft, nontender, nondistended, + bowel sounds  Extremities: warm dry without cyanosis clubbing or edema  Neuro: AAOx3, nonfocal  Psych: Normal affect and demeanor, pleasant.  Discharge Instructions      Discharge Instructions    Discharge instructions    Complete by:  As directed   Patient will be discharged to home.  Patient will need to follow up with primary care provider within one week of discharge.  Follow up with Dr. Irene [name], oncologist, within one week.  Follow up with Dr. Rayann [name] or Dr. [name] for cardiac workup before surgery.   Patient should continue medications as prescribed.  Patient should follow a heart healthy diet.            Medication List    STOP taking these medications        ranitidine 300 MG tablet  Commonly known as:  ZANTAC     warfarin 2 MG tablet  Commonly known as:  COUMADIN      TAKE these medications        ALPRAZolam 0.5 MG tablet  Commonly known as:  XANAX  TAKE 1/2 TO 1 TABLET BY MOUTH 3 TIMES A DAY AS NEEDED FOR ANXIETY     atenolol 100 MG tablet  Commonly known as:  TENORMIN  Take 1 tablet (100 mg total) by mouth 2 (two) times daily.     B-complex with vitamin C tablet  Take 1 tablet by mouth daily.     Biotin 5000 MCG Caps  Take 10,000 mcg by mouth every morning.     cyanocobalamin 1000 MCG/ML injection  Commonly known as:  (VITAMIN B-12)  Inject 1 mL (1,000 mcg total) into the skin daily.     furosemide 40 MG tablet  Commonly known as:   LASIX  Take 1 tablet (40 mg total) by mouth daily.     guaiFENesin 600 MG 12 hr tablet  Commonly known as:  MUCINEX  Take 2 tablets (1,200 mg total) by mouth 2 (two) times daily.     levothyroxine 100 MCG tablet  Commonly known as:  SYNTHROID, LEVOTHROID  TAKE 1 TABLET BY MOUTH EVERY DAY ON EMPTY STOMACH WITH A FULL GLASS OF WATER     losartan 100 MG tablet  Commonly known as:  COZAAR  TAKE 1 TABLET (100 MG TOTAL) BY MOUTH DAILY.     nitroGLYCERIN 0.4 MG SL tablet  Commonly known as:  NITROSTAT  Place 1 tablet (0.4 mg total) under the tongue every 5 (five) minutes as needed for chest pain (MAX 3 TABLETS).     pantoprazole 40 MG tablet  Commonly known as:  PROTONIX  Take 1 tablet (40 mg total) by mouth 2 (two) times daily.     potassium chloride SA 20 MEQ tablet  Commonly known as:  K-DUR,KLOR-CON  Take 20 mEq by mouth 2 (two) times daily.     pregabalin 75 MG capsule  Commonly known as:  LYRICA  Take 1 capsule (75 mg total) by mouth 2 (two) times daily.     sertraline 50 MG tablet  Commonly known as:  ZOLOFT  Take 1 tablet (50 mg total) by mouth daily. For mood     sucralfate 1 g tablet  Commonly known as:  CARAFATE  Take 1 tablet (1 g total) by mouth 4 (four) times daily -  with meals and at bedtime.     traMADol 50 MG tablet  Commonly known as:  ULTRAM  Take 50 mg by mouth every 6 (six) hours as needed (pain). Reported on 10/14/2015     triamcinolone cream 0.1 %  Commonly known as:  KENALOG  APPLY TO AFFECTED AREA TWICE A DAY     VITAMIN D PO  Take 5,000 Units by mouth every morning.       Allergies  Allergen Reactions  . Amiodarone      Thyroid and liver and kidney problems  . Codeine Nausea And Vomiting  . Gabapentin Itching  . Hydrocodone Other (See Comments)    hallucination  . Morphine And Related Nausea And Vomiting  . Requip [Ropinirole Hcl]     Headache   . Zinc     nausea   Follow-up Information    Follow up with [facility].   Why:  HHPT   Contact information:   [place] [address] [phone]       Follow up with Valerie Richards, MD. Schedule an appointment as soon as possible for a visit in 1 week.   Specialty:  Internal Medicine   Why:  Hospital follow up   Contact information:   [address] [place] [address] [phone]       Follow up with Sullivan Lone, MD. Schedule an appointment as soon as possible for a visit in 1 week.   Specialties:  Hematology, Oncology   Why:  Hospital follow up   Contact information:   [place] [address] [phone]       Follow up with Thompson Grayer, MD. Schedule an appointment as soon as possible for a visit in 1 week.   Specialty:  Cardiology   Why:  Hospital follow    Contact information:   [address] [place] [address] [phone]       Follow up with [facility], MD. Schedule an appointment as soon as possible for a visit in 2 weeks.   Specialty:  General Surgery   Why:  Gastric mass   Contact information:   [place] [address] [phone]        The results of significant diagnostics from this hospitalization (including imaging, microbiology, ancillary and laboratory) are listed below for reference.    Significant Diagnostic Studies: Dg Chest 2 View  11/14/2015  CLINICAL DATA:  Shortness of breath and pleural effusions EXAM: CHEST  2 VIEW COMPARISON:  11/12/2015 FINDINGS: Cardiac shadow remains enlarged. A pacing device is again seen. Bilateral pleural effusions left greater than right are noted. No focal infiltrate is seen. No bony abnormality is noted. IMPRESSION: Stable bilateral pleural effusions. Electronically Signed   By: Inez Catalina M.D.   On: 11/14/2015 14:20   Dg Chest 2 View  11/07/2015  CLINICAL DATA:  Productive cough with green sputum, shortness of breath and fever for 5 days. EXAM: CHEST  2 VIEW COMPARISON:   08/02/2014 FINDINGS: Stable appearance of the right dual chamber cardiac pacemaker. Heart size is within normal limits. Aortic arch is calcified. Lungs are clear without airspace disease or pulmonary  edema. No acute bone abnormality. No pleural effusions. IMPRESSION: No active cardiopulmonary disease. Electronically Signed   By: Markus Daft M.D.   On: 11/07/2015 13:13   Dg Knee 1-2 Views Right  11/08/2015  CLINICAL DATA:  Pain for 2 days. No recent trauma. Total knee replacement 3 years ago. EXAM: RIGHT KNEE - 1-2 VIEW COMPARISON:  None. FINDINGS: The patient is status post knee replacement. Hardware is in good position with no loosening or failure identified. Surgical screws and metallic tacks project over the proximal tibia, also in good position. No joint effusion. Mild anterior soft tissue swelling is seen. No bony erosion or bony lesions. No fractures are seen. IMPRESSION: Mild anterior soft tissue swelling.  No other abnormalities. Electronically Signed   By: Dorise Bullion III M.D   On: 11/08/2015 16:00   Ct Chest Wo Contrast  11/07/2015  CLINICAL DATA:  Lethargy, fever, URI symptoms, increased coughing, shortness of breath,, weakness, fever and chills today, hypoxemia with 84% oxygen saturation on room air, history coronary artery disease, CHF, paroxysmal atrial fibrillation, hypertension, hyperlipidemia EXAM: CT CHEST WITHOUT CONTRAST TECHNIQUE: Multidetector CT imaging of the chest was performed following the standard protocol without IV contrast. Sagittal and coronal MPR images reconstructed from axial data set. COMPARISON:  08/03/2014 FINDINGS: Beam hardening artifacts from pacemaker generator upper RIGHT chest. Extensive atherosclerotic calcifications aorta, proximal great vessels, and coronary arteries. Aneurysmal dilatation ascending thoracic aorta 4.1 cm transverse image 25. Minimally enlarged precarinal lymph node 11 mm short axis image 21. Additional scattered normal size thoracic nodes.  Gallbladder surgically absent. Visualized upper abdomen otherwise unremarkable. Central peribronchial thickening. Dependent atelectasis in the posterior lungs bilaterally. Tiny LEFT pleural effusion. Calcified granuloma RIGHT upper lobe image 22. No acute infiltrate, pleural effusion, or pneumothorax. Bones demineralized with scattered degenerative disc disease changes thoracic spine. IMPRESSION: Bronchitic changes with bibasilar atelectasis. Single nonspecific minimally enlarged precarinal lymph node. Extensive atherosclerotic calcification with aneurysmal dilatation of the ascending thoracic aorta; recommendation below. Recommend annual imaging followup by CTA or MRA. This recommendation follows 2010 ACCF/AHA/AATS/ACR/ASA/SCA/SCAI/SIR/STS/SVM Guidelines for the Diagnosis and Management of Patients with Thoracic Aortic Disease. Circulation. 2010; [id number] Electronically Signed   By: Lavonia Dana M.D.   On: 11/07/2015 19:41   Ct Chest W Contrast  11/12/2015  CLINICAL DATA:  Progressively worsening dyspnea. Subjective fever, chills and cough. Malaise and decreased oral intake. Gastric mass on endoscopy. EXAM: CT CHEST, ABDOMEN, AND PELVIS WITH CONTRAST TECHNIQUE: Multidetector CT imaging of the chest, abdomen and pelvis was performed following the standard protocol during bolus administration of intravenous contrast. CONTRAST:  80mL OMNIPAQUE IOHEXOL 300 MG/ML SOLN, 130mL OMNIPAQUE IOHEXOL 300 MG/ML SOLN COMPARISON:  Chest CT five days prior 11/07/2015. Most recent CT abdomen/ pelvis 05/31/2014 FINDINGS: CT CHEST FINDINGS Mediastinum/Lymph Nodes: Enlarged precarinal lymph node measures 1.5 cm, previously 1.1 cm. Additional small mediastinal and hilar lymph nodes are not enlarged by size criteria. Aneurysmal dilatation of the ascending aorta, 3.8 cm currently, differences likely secondary to caliper placement. No aortic dissection. Coronary artery calcifications are seen. Pacemaker in place. Mitral annulus  calcifications. No pericardial effusion. Lungs/Pleura: Progression in bilateral pleural effusions, now moderate in degree. There is adjacent compressive atelectasis in both lower lobes. Calcified granuloma in the right lung is unchanged. Musculoskeletal: There are no acute or suspicious osseous abnormalities. Multilevel degenerative change in the thoracic spine. CT ABDOMEN PELVIS FINDINGS Hepatobiliary: No focal hepatic lesion. Postcholecystectomy with unchanged prominence of the common bile duct measuring 11 mm. No calcified choledocholithiasis. Pancreas: No ductal  dilatation or inflammation. Spleen: Normal in size and density, no focal abnormality. Adrenals/Urinary Tract: There is no adrenal nodule. Patient is post left nephrectomy. Homogeneous attenuation throughout the right kidney without hydronephrosis. Urinary bladder is minimally distended. Stomach/Bowel: Irregular wall thickening involving the gastric fundus. More distal stomach is decompressed. No definite perigastric lymphadenopathy. There are no dilated or thickened bowel loops. Contrast and stool throughout the colon without abnormal distension. No bowel obstruction, oral contrast reaches the rectum. Appendix is not visualized. Vascular/Lymphatic: Dense atherosclerosis of the abdominal aorta without aneurysm. Small retroperitoneal lymph nodes, not enlarged by size criteria. Reproductive: Uterus is surgically absent. No gross adnexal mass, detailed pelvic evaluation partially obscured by streak artifact from right hip prosthesis. Other: Whole body wall edema. There is mild presacral edema, chronic. Post bilateral pelvic lymph node dissection. No free intra-abdominal air or free fluid. Musculoskeletal: Right hip arthroplasty. There are no acute or suspicious osseous abnormalities. Multilevel degenerative change in the lumbar spine. IMPRESSION: 1. Progressive bilateral pleural effusions since exam 5 days prior, now moderate in degree. There is adjacent  compressive atelectasis in the lower lobes. 2. Minimal increased size in the prominent pretracheal lymph node, nonspecific, increased size likely reactive. 3. Irregular gastric wall thickening involving the fundus, concerning for malignancy. No definite perigastric adenopathy. No evidence of metastatic disease in the abdomen and pelvis. 4. Post left nephrectomy, cholecystectomy, and hysterectomy. Electronically Signed   By: Jeb Levering M.D.   On: 11/12/2015 23:55   Ct Abdomen Pelvis W Contrast  11/12/2015  CLINICAL DATA:  Progressively worsening dyspnea. Subjective fever, chills and cough. Malaise and decreased oral intake. Gastric mass on endoscopy. EXAM: CT CHEST, ABDOMEN, AND PELVIS WITH CONTRAST TECHNIQUE: Multidetector CT imaging of the chest, abdomen and pelvis was performed following the standard protocol during bolus administration of intravenous contrast. CONTRAST:  57mL OMNIPAQUE IOHEXOL 300 MG/ML SOLN, 187mL OMNIPAQUE IOHEXOL 300 MG/ML SOLN COMPARISON:  Chest CT five days prior 11/07/2015. Most recent CT abdomen/ pelvis 05/31/2014 FINDINGS: CT CHEST FINDINGS Mediastinum/Lymph Nodes: Enlarged precarinal lymph node measures 1.5 cm, previously 1.1 cm. Additional small mediastinal and hilar lymph nodes are not enlarged by size criteria. Aneurysmal dilatation of the ascending aorta, 3.8 cm currently, differences likely secondary to caliper placement. No aortic dissection. Coronary artery calcifications are seen. Pacemaker in place. Mitral annulus calcifications. No pericardial effusion. Lungs/Pleura: Progression in bilateral pleural effusions, now moderate in degree. There is adjacent compressive atelectasis in both lower lobes. Calcified granuloma in the right lung is unchanged. Musculoskeletal: There are no acute or suspicious osseous abnormalities. Multilevel degenerative change in the thoracic spine. CT ABDOMEN PELVIS FINDINGS Hepatobiliary: No focal hepatic lesion. Postcholecystectomy with  unchanged prominence of the common bile duct measuring 11 mm. No calcified choledocholithiasis. Pancreas: No ductal dilatation or inflammation. Spleen: Normal in size and density, no focal abnormality. Adrenals/Urinary Tract: There is no adrenal nodule. Patient is post left nephrectomy. Homogeneous attenuation throughout the right kidney without hydronephrosis. Urinary bladder is minimally distended. Stomach/Bowel: Irregular wall thickening involving the gastric fundus. More distal stomach is decompressed. No definite perigastric lymphadenopathy. There are no dilated or thickened bowel loops. Contrast and stool throughout the colon without abnormal distension. No bowel obstruction, oral contrast reaches the rectum. Appendix is not visualized. Vascular/Lymphatic: Dense atherosclerosis of the abdominal aorta without aneurysm. Small retroperitoneal lymph nodes, not enlarged by size criteria. Reproductive: Uterus is surgically absent. No gross adnexal mass, detailed pelvic evaluation partially obscured by streak artifact from right hip prosthesis. Other: Whole body wall edema. There is  mild presacral edema, chronic. Post bilateral pelvic lymph node dissection. No free intra-abdominal air or free fluid. Musculoskeletal: Right hip arthroplasty. There are no acute or suspicious osseous abnormalities. Multilevel degenerative change in the lumbar spine. IMPRESSION: 1. Progressive bilateral pleural effusions since exam 5 days prior, now moderate in degree. There is adjacent compressive atelectasis in the lower lobes. 2. Minimal increased size in the prominent pretracheal lymph node, nonspecific, increased size likely reactive. 3. Irregular gastric wall thickening involving the fundus, concerning for malignancy. No definite perigastric adenopathy. No evidence of metastatic disease in the abdomen and pelvis. 4. Post left nephrectomy, cholecystectomy, and hysterectomy. Electronically Signed   By: Jeb Levering M.D.   On:  11/12/2015 23:55    Microbiology: Recent Results (from the past 240 hour(s))  Culture, blood (routine x 2)     Status: None   Collection Time: 11/07/15 12:28 PM  Result Value Ref Range Status   Specimen Description BLOOD RIGHT ANTECUBITAL  Final   Special Requests IN PEDIATRIC BOTTLE 4ML  Final   Culture   Final    NO GROWTH 5 DAYS Performed at [facility]    Report Status 11/12/2015 FINAL  Final  Culture, blood (routine x 2)     Status: None   Collection Time: 11/07/15 12:29 PM  Result Value Ref Range Status   Specimen Description BLOOD LEFT HAND  Final   Special Requests IN PEDIATRIC BOTTLE 2ML  Final   Culture  Setup Time   Final    GRAM POSITIVE COCCI IN CHAINS IN PEDIATRIC BOTTLE CRITICAL RESULT CALLED TO, READ BACK BY AND VERIFIED WITH: C ADDISON,RN @0449  11/08/15 MKELLY    Culture   Final    VIRIDANS STREPTOCOCCUS THE SIGNIFICANCE OF ISOLATING THIS ORGANISM FROM A SINGLE SET OF BLOOD CULTURES WHEN MULTIPLE SETS ARE DRAWN IS UNCERTAIN. PLEASE NOTIFY THE MICROBIOLOGY DEPARTMENT WITHIN ONE WEEK IF SPECIATION AND SENSITIVITIES ARE REQUIRED. Performed at [facility]    Report Status 11/11/2015 FINAL  Final  Urine culture     Status: None   Collection Time: 11/07/15  4:35 PM  Result Value Ref Range Status   Specimen Description URINE, CLEAN CATCH  Final   Special Requests NONE  Final   Culture   Final    MULTIPLE SPECIES PRESENT, SUGGEST RECOLLECTION Performed at [facility]    Report Status 11/09/2015 FINAL  Final  Culture, sputum-assessment     Status: None   Collection Time: 11/07/15  8:42 PM  Result Value Ref Range Status   Specimen Description SPUTUM  Final   Special Requests NONE  Final   Sputum evaluation   Final    THIS SPECIMEN IS ACCEPTABLE. RESPIRATORY CULTURE REPORT TO FOLLOW.   Report Status 11/07/2015 FINAL  Final  Culture, respiratory (NON-Expectorated)     Status: None   Collection Time: 11/07/15  8:42 PM  Result Value Ref  Range Status   Specimen Description SPUTUM  Final   Special Requests NONE  Final   Gram Stain   Final    ABUNDANT WBC PRESENT, PREDOMINANTLY PMN FEW SQUAMOUS EPITHELIAL CELLS PRESENT MODERATE GRAM POSITIVE COCCI IN PAIRS IN CLUSTERS FEW GRAM POSITIVE RODS THIS SPECIMEN IS ACCEPTABLE FOR SPUTUM CULTURE Performed at [facility]    Culture   Final    NORMAL OROPHARYNGEAL FLORA Performed at [facility]    Report Status 11/10/2015 FINAL  Final  Culture, blood (routine x 2)     Status: None (Preliminary result)  Collection Time: 11/10/15  5:10 AM  Result Value Ref Range Status   Specimen Description BLOOD LEFT ARM  Final   Special Requests IN PEDIATRIC BOTTLE 3ML  Final   Culture   Final    NO GROWTH 4 DAYS Performed at [facility]    Report Status PENDING  Incomplete  Culture, blood (routine x 2)     Status: None (Preliminary result)   Collection Time: 11/10/15  8:00 AM  Result Value Ref Range Status   Specimen Description BLOOD LEFT ARM  Final   Special Requests BOTTLES DRAWN AEROBIC AND ANAEROBIC [place]  Final   Culture   Final    NO GROWTH 4 DAYS Performed at [facility]    Report Status PENDING  Incomplete     Labs: Basic Metabolic Panel:  Recent Labs Lab 11/11/15 0439 11/12/15 0514 11/13/15 0445 11/14/15 0455 11/15/15 0553  NA 140 142 138 139 144  K 4.1 3.8 3.6 3.3* 3.9  CL 114* 113* 111 112* 112*  CO2 17* 18* 18* 20* 23  GLUCOSE 103* 95 125* 92 93  BUN 9 10 11 11 9   CREATININE 0.92 1.04* 1.01* 1.05* 1.08*  CALCIUM 8.9 8.7* 8.2* 7.9* 8.8*   Liver Function Tests:  Recent Labs Lab 11/09/15 0605 11/10/15 0511  AST 18 20  ALT 11* 12*  ALKPHOS 72 75  BILITOT 0.7 0.5  PROT 5.4* 5.7*  ALBUMIN 2.4* 2.5*   No results for input(s): LIPASE, AMYLASE in the last 168 hours. No results for input(s): AMMONIA in the last 168 hours. CBC:  Recent Labs Lab 11/09/15 0605  11/11/15 0439 11/12/15 0514 11/13/15 0445 11/14/15 0455  11/15/15 0553  WBC 12.8*  < > 10.7* 9.5 8.3 8.7 8.6  NEUTROABS 9.8*  --   --   --   --   --   --   HGB 7.9*  < > 8.7* 8.0* 7.9* 8.2* 8.6*  HCT 25.6*  < > 28.1* 26.0* 25.7* 26.0* 27.8*  MCV 86.8  < > 86.2 85.2 86.8 83.6 88.3  PLT 179  < > 234 208 231 246 286  < > = values in this interval not displayed. Cardiac Enzymes: No results for input(s): CKTOTAL, CKMB, CKMBINDEX, TROPONINI in the last 168 hours. BNP: BNP (last 3 results) No results for input(s): BNP in the last 8760 hours.  ProBNP (last 3 results) No results for input(s): PROBNP in the last 8760 hours.  CBG: No results for input(s): GLUCAP in the last 168 hours.     SignedCristal Ford  [facility] 11/15/2015, 11:04 AM

## 2015-11-16 DIAGNOSIS — I129 Hypertensive chronic kidney disease with stage 1 through stage 4 chronic kidney disease, or unspecified chronic kidney disease: Secondary | ICD-10-CM | POA: Diagnosis not present

## 2015-11-16 DIAGNOSIS — Z85528 Personal history of other malignant neoplasm of kidney: Secondary | ICD-10-CM | POA: Diagnosis not present

## 2015-11-16 DIAGNOSIS — N183 Chronic kidney disease, stage 3 (moderate): Secondary | ICD-10-CM | POA: Diagnosis not present

## 2015-11-16 DIAGNOSIS — I480 Paroxysmal atrial fibrillation: Secondary | ICD-10-CM | POA: Diagnosis not present

## 2015-11-16 DIAGNOSIS — A4189 Other specified sepsis: Secondary | ICD-10-CM | POA: Diagnosis not present

## 2015-11-16 DIAGNOSIS — I2510 Atherosclerotic heart disease of native coronary artery without angina pectoris: Secondary | ICD-10-CM | POA: Diagnosis not present

## 2015-11-16 DIAGNOSIS — Z7901 Long term (current) use of anticoagulants: Secondary | ICD-10-CM | POA: Diagnosis not present

## 2015-11-16 DIAGNOSIS — J189 Pneumonia, unspecified organism: Secondary | ICD-10-CM | POA: Diagnosis not present

## 2015-11-16 DIAGNOSIS — Z950 Presence of cardiac pacemaker: Secondary | ICD-10-CM | POA: Diagnosis not present

## 2015-11-16 DIAGNOSIS — K319 Disease of stomach and duodenum, unspecified: Secondary | ICD-10-CM | POA: Diagnosis not present

## 2015-11-17 ENCOUNTER — Telehealth: Payer: Self-pay | Admitting: Hematology

## 2015-11-17 NOTE — Telephone Encounter (Signed)
S/w patient dtr and gave appt for 03/20 @ 1 lab, 1:30 md.  Calendar mailed.

## 2015-11-18 ENCOUNTER — Ambulatory Visit (INDEPENDENT_AMBULATORY_CARE_PROVIDER_SITE_OTHER): Payer: Medicare Other | Admitting: Internal Medicine

## 2015-11-18 ENCOUNTER — Encounter: Payer: Self-pay | Admitting: Internal Medicine

## 2015-11-18 VITALS — BP 132/80 | HR 100 | Temp 97.9°F | Resp 16

## 2015-11-18 DIAGNOSIS — N390 Urinary tract infection, site not specified: Secondary | ICD-10-CM

## 2015-11-18 DIAGNOSIS — I10 Essential (primary) hypertension: Secondary | ICD-10-CM

## 2015-11-18 DIAGNOSIS — D509 Iron deficiency anemia, unspecified: Secondary | ICD-10-CM

## 2015-11-18 DIAGNOSIS — Z79899 Other long term (current) drug therapy: Secondary | ICD-10-CM

## 2015-11-18 LAB — CBC WITH DIFFERENTIAL/PLATELET
BASOS ABS: 0 10*3/uL (ref 0.0–0.1)
Basophils Relative: 0 % (ref 0–1)
Eosinophils Absolute: 0.2 10*3/uL (ref 0.0–0.7)
Eosinophils Relative: 2 % (ref 0–5)
HEMATOCRIT: 33.5 % — AB (ref 36.0–46.0)
HEMOGLOBIN: 10.7 g/dL — AB (ref 12.0–15.0)
LYMPHS ABS: 1.9 10*3/uL (ref 0.7–4.0)
LYMPHS PCT: 17 % (ref 12–46)
MCH: 27.9 pg (ref 26.0–34.0)
MCHC: 31.9 g/dL (ref 30.0–36.0)
MCV: 87.2 fL (ref 78.0–100.0)
MPV: 9.9 fL (ref 8.6–12.4)
Monocytes Absolute: 0.8 10*3/uL (ref 0.1–1.0)
Monocytes Relative: 7 % (ref 3–12)
NEUTROS PCT: 74 % (ref 43–77)
Neutro Abs: 8.1 10*3/uL — ABNORMAL HIGH (ref 1.7–7.7)
Platelets: 404 10*3/uL — ABNORMAL HIGH (ref 150–400)
RBC: 3.84 MIL/uL — ABNORMAL LOW (ref 3.87–5.11)
RDW: 19.4 % — ABNORMAL HIGH (ref 11.5–15.5)
WBC: 11 10*3/uL — ABNORMAL HIGH (ref 4.0–10.5)

## 2015-11-18 LAB — BASIC METABOLIC PANEL WITH GFR
BUN: 11 mg/dL (ref 7–25)
CHLORIDE: 104 mmol/L (ref 98–110)
CO2: 22 mmol/L (ref 20–31)
Calcium: 9.1 mg/dL (ref 8.6–10.4)
Creat: 0.92 mg/dL (ref 0.60–0.93)
GFR, Est African American: 68 mL/min (ref >=60)
GFR, Est Non African American: 59 mL/min — ABNORMAL LOW (ref >=60)
GLUCOSE: 98 mg/dL (ref 65–99)
POTASSIUM: 3.9 mmol/L (ref 3.5–5.3)
Sodium: 141 mmol/L (ref 135–146)

## 2015-11-18 NOTE — Progress Notes (Signed)
Subjective:    Patient ID: [name], female    DOB: May 03, 1934, 82 y.o.   MRN: [id number]  HPI  Patient is a delightful 82 yo recently widowed WF recently hospitalized with a PNA and during hospitalization, a w/u of anemia found B12 deficiency and endoscopies discovered a gastric Cancer. Hgb has stabilized about 8.5 gm% and of note patient is a Jehovah's Witness declining any transfusion of blood products. Patient is tentatively scheduled for pre-operative cardiac stress testing and has f/u scheduled with Dr Irene Limbo for consideration of PET scan according to patient's daughters. When all is accomplished, patient is to schedule with Dr Barry Dienes anticipating surgery. Patient denies any GI sx's as N/V, HB,D/C or GIB. She admits some persistent cough now productive of a clear to white to occasional yellowish sputum and denies any fever, chills, sweats, dyspnea or rash. Patient does admit occasional dysuria.     Of note this very nice unfortunate lady has hx/o 2 separate primary colon cancers, uterine cancer and kidney cancer.   Medication Sig  . ALPRAZolam  0.5 MG tablet TAKE 1/2 TO 1 TABLET BY MOUTH 3 TIMES A DAY AS NEEDED FOR ANXIETY  . atenolol  100 MG tablet Take 1 tablet (100 mg total) by mouth 2 (two) times daily.  . B Complex-C  tablet Take 1 tablet by mouth daily.  . Biotin 5000 MCG CAPS Take 10,000 mcg by mouth every morning.   [name] VITAMIN D Take 5,000 Units by mouth every morning.   [name] VITAMIN B-12 1000 MCG/ML injection Inject 1 mL (1,000 mcg total) into the skin daily.  . furosemide  40 MG tablet Take 1 tablet (40 mg total) by mouth daily.  [name] MUCINEX 600 MG Take 2 tablets (1,200 mg total) by mouth 2 (two) times daily.  [name] levothyroxine  100 MCG tablet TAKE 1 TABLET BY MOUTH EVERY DAY ON EMPTY STOMACH WITH A FULL GLASS OF WATER  . losartan  100 MG tablet TAKE 1 TABLET (100 MG TOTAL) BY MOUTH DAILY.  [name] NITROSTAT 0.4 MG SL Place 1 tablet (0.4 mg total) under the tongue every 5 (five) minutes as  needed for chest pain (MAX 3 TABLETS).  . pantoprazole 40 MG  Take 1 tablet (40 mg total) by mouth 2 (two) times daily.  . potassium chloride20 MEQ tablet Take 20 mEq by mouth 2 (two) times daily.  . pregabalin  75 MG capsule Take 1 capsule (75 mg total) by mouth 2 (two) times daily.  . sertraline  50 MG tablet Take 1 tablet (50 mg total) by mouth daily. For mood  . sucralfate  1 g tablet Take 1 tablet (1 g total) by mouth 4 (four) times daily -  with meals and at bedtime.  . traMADol 50 MG tablet Take 50 mg by mouth every 6 (six) hours as needed (pain). Reported on 10/14/2015  . triamcinolone cream 0.1 % APPLY TO AFFECTED AREA TWICE A DAY   Allergies  Allergen Reactions  . Amiodarone      Thyroid and liver and kidney problems  . Codeine Nausea And Vomiting  . Gabapentin Itching  . Hydrocodone Other (See Comments)    hallucination  . Morphine And Related Nausea And Vomiting  . Requip [Ropinirole Hcl]     Headache   . Zinc     nausea   Past Medical History  Diagnosis Date  . PAF (paroxysmal atrial fibrillation) ([place])     controlled with amiodarone, on coumadin  . CAD (coronary  artery disease)   . Aortic root dilatation ([place])   . Moderate aortic stenosis   . Chronic anticoagulation   . Tachycardia-bradycardia syndrome ([place])     s/p PPM by Dr Doreatha Lew (MDT) 07/03/10  . GERD (gastroesophageal reflux disease)   . Pacemaker     2012  . Neuropathy ([place])     feet/legs  . Restless leg syndrome   . Headache(784.0)     hx migraines - takes Zoloft for migraines  . History of uterine cancer 1980s    treated with hysterectomy, external radiation and radiation seed implants.   . Cancer ([place])     hx Kidney cancer / hx of endometrial cancer  . PONV (postoperative nausea and vomiting)   . Anxiety   . Heart murmur   . HTN (hypertension)   . Hyperlipemia   . Hypothyroidism   . Osteomyelitis ([place]) as child  . Family history of anesthesia complication     daughter has ponv  . Syncope  05/28/2014  . Hepatic steatosis   . Elevated LFTs   . Gastroparesis   . Anal fissure   . Radiation proctitis   . Gastric polyp     adenomatous  . Neuromuscular disorder (HCC)     neuropathy in right leg  . Chronic kidney disease     only has one kidney   . 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 Adenocarcinoma of stomach ([place]) 11/11/2015   Past Surgical History  Procedure Laterality Date  . Cardiac catheterization  2008  . Nephrectomy Left     renal cell cancer  . Total abdominal hysterectomy    . Patent ductus arterious repair  age 30  . Ptvp    . Pacemaker insertion  07/04/11    by Dr Doreatha Lew  . Cholecystectomy  1970s  . Upper gastrointestinal endoscopy    . Total knee arthroplasty  02/07/2012    Procedure: TOTAL KNEE ARTHROPLASTY;  Surgeon: Mauri Pole, MD;  Location: [facility];  Service: Orthopedics;  Laterality: Right;  . Insert / replace / remove pacemaker    . Total hip revision Right 2009  . I&d knee with poly exchange Right 12/17/2013    Procedure: IRRIGATION AND DEBRIDEMEN RIGHT TOTAL  KNEE WITH POLY EXCHANGE;  Surgeon: Mauri Pole, MD;  [facility]: [facility];  Service: Orthopedics;  Laterality: Right;  . Patellar tendon repair Right 03/06/2014    Procedure: AVULSION WITH PATELLA TENDON REPAIR;  Surgeon: Mauri Pole, MD;  [facility]: [facility];  Service: Orthopedics;  Laterality: Right;  . Total knee revision Right 07/29/2014    Procedure: RIGHT KNEE REVISION OF PREVIOUS REPAIR EXTENSOR MECHANISM;  Surgeon: Mauri Pole, MD;  [facility];  Service: Orthopedics;  Laterality: Right;  . Colonoscopy N/A 11/11/2015    Procedure: COLONOSCOPY;  Surgeon: [name], MD;  [facility];  Service: Endoscopy;  Laterality: N/A;  . Esophagogastroduodenoscopy N/A 11/11/2015    Procedure: ESOPHAGOGASTRODUODENOSCOPY (EGD);  Surgeon: [name], MD;  Location: [facility];  Service: Endoscopy;  Laterality: N/A;   Review of Systems  10 point systems review negative except as above.     Objective:   Physical Exam  BP 132/80 mmHg  Pulse 100  Temp(Src) 97.9 F (36.6 C)  Resp 16  LMP  (Exact Date)  HEENT - Eac's patent. TM's Nl. EOM's full. PERRLA. NasoOroPharynx clear. Neck - supple. Nl Thyroid. Carotids 2+ & No bruits, nodes, JVD Chest - BS decreased equal & clear  w/o rales,  rhonchi, wheezes. Cor - Nl HS. RRR w/o sig MGR. PP 1(+). No edema. Abd - No palpable organomegaly, masses or tenderness. BS nl. MS- FROM w/o deformities. Muscle power, tone and bulk Nl. Gait Nl. Neuro - No obvious Cr N abnormalities. Sensory, motor and Cerebellar functions appear Nl w/o focal abnormalities. Psyche - Mental status normal & appropriate.  No delusions, ideations or obvious mood abnormalities.    Assessment & Plan:   1. Essential hypertension   2. Iron deficiency anemia  - CBC with Differential/Platelet  3. Medication management  - BASIC METABOLIC PANEL WITH GFR  4. Urinary tract infection, site not specified  - Urinalysis, Routine w reflex microscopic (not at [facility]) - Urine culture

## 2015-11-19 ENCOUNTER — Telehealth (HOSPITAL_COMMUNITY): Payer: Self-pay | Admitting: *Deleted

## 2015-11-19 ENCOUNTER — Inpatient Hospital Stay: Payer: Self-pay | Admitting: Hematology

## 2015-11-19 ENCOUNTER — Encounter: Payer: Self-pay | Admitting: Cardiology

## 2015-11-19 DIAGNOSIS — Z7901 Long term (current) use of anticoagulants: Secondary | ICD-10-CM | POA: Diagnosis not present

## 2015-11-19 DIAGNOSIS — I2510 Atherosclerotic heart disease of native coronary artery without angina pectoris: Secondary | ICD-10-CM | POA: Diagnosis not present

## 2015-11-19 DIAGNOSIS — N183 Chronic kidney disease, stage 3 (moderate): Secondary | ICD-10-CM | POA: Diagnosis not present

## 2015-11-19 DIAGNOSIS — A4189 Other specified sepsis: Secondary | ICD-10-CM | POA: Diagnosis not present

## 2015-11-19 DIAGNOSIS — Z950 Presence of cardiac pacemaker: Secondary | ICD-10-CM | POA: Diagnosis not present

## 2015-11-19 DIAGNOSIS — I480 Paroxysmal atrial fibrillation: Secondary | ICD-10-CM | POA: Diagnosis not present

## 2015-11-19 DIAGNOSIS — Z85528 Personal history of other malignant neoplasm of kidney: Secondary | ICD-10-CM | POA: Diagnosis not present

## 2015-11-19 DIAGNOSIS — K319 Disease of stomach and duodenum, unspecified: Secondary | ICD-10-CM | POA: Diagnosis not present

## 2015-11-19 DIAGNOSIS — J189 Pneumonia, unspecified organism: Secondary | ICD-10-CM | POA: Diagnosis not present

## 2015-11-19 DIAGNOSIS — I129 Hypertensive chronic kidney disease with stage 1 through stage 4 chronic kidney disease, or unspecified chronic kidney disease: Secondary | ICD-10-CM | POA: Diagnosis not present

## 2015-11-19 LAB — URINALYSIS, ROUTINE W REFLEX MICROSCOPIC
Bilirubin Urine: NEGATIVE
GLUCOSE, UA: NEGATIVE
Hgb urine dipstick: NEGATIVE
Ketones, ur: NEGATIVE
Nitrite: NEGATIVE
PH: 5.5 (ref 5.0–8.0)
Protein, ur: NEGATIVE
Specific Gravity, Urine: 1.011 (ref 1.001–1.035)

## 2015-11-19 LAB — URINALYSIS, MICROSCOPIC ONLY
BACTERIA UA: NONE SEEN [HPF]
CASTS: NONE SEEN [LPF]
Crystals: NONE SEEN [HPF]
RBC / HPF: NONE SEEN RBC/HPF (ref <=2)
SQUAMOUS EPITHELIAL / LPF: NONE SEEN [HPF] (ref <=5)
WBC, UA: NONE SEEN WBC/HPF (ref <=5)
YEAST: NONE SEEN [HPF]

## 2015-11-19 NOTE — Telephone Encounter (Signed)
Patient given detailed instructions per Myocardial Perfusion Study Information Sheet for the test on  11/21/15. Patient notified to arrive 15 minutes early and that it is imperative to arrive on time for appointment to keep from having the test rescheduled.  If you need to cancel or reschedule your appointment, please call the office within 24 hours of your appointment. Failure to do so may result in a cancellation of your appointment, and a $50 no show fee. Patient verbalized understanding. Hubbard Robinson, RN

## 2015-11-21 ENCOUNTER — Other Ambulatory Visit: Payer: Self-pay | Admitting: *Deleted

## 2015-11-21 ENCOUNTER — Ambulatory Visit (HOSPITAL_COMMUNITY): Payer: Medicare Other | Attending: Internal Medicine

## 2015-11-21 ENCOUNTER — Other Ambulatory Visit: Payer: Self-pay | Admitting: Internal Medicine

## 2015-11-21 DIAGNOSIS — R002 Palpitations: Secondary | ICD-10-CM | POA: Insufficient documentation

## 2015-11-21 DIAGNOSIS — R0609 Other forms of dyspnea: Secondary | ICD-10-CM | POA: Diagnosis not present

## 2015-11-21 DIAGNOSIS — I4819 Other persistent atrial fibrillation: Secondary | ICD-10-CM

## 2015-11-21 DIAGNOSIS — R0602 Shortness of breath: Secondary | ICD-10-CM | POA: Diagnosis not present

## 2015-11-21 DIAGNOSIS — R0789 Other chest pain: Secondary | ICD-10-CM | POA: Insufficient documentation

## 2015-11-21 DIAGNOSIS — I10 Essential (primary) hypertension: Secondary | ICD-10-CM | POA: Insufficient documentation

## 2015-11-21 DIAGNOSIS — I481 Persistent atrial fibrillation: Secondary | ICD-10-CM | POA: Diagnosis not present

## 2015-11-21 DIAGNOSIS — C169 Malignant neoplasm of stomach, unspecified: Secondary | ICD-10-CM

## 2015-11-21 DIAGNOSIS — N390 Urinary tract infection, site not specified: Secondary | ICD-10-CM

## 2015-11-21 LAB — MYOCARDIAL PERFUSION IMAGING
CHL CUP NUCLEAR SSS: 1
CSEPPHR: 129 {beats}/min
LV sys vol: 19 mL
LVDIAVOL: 49 mL (ref 46–106)
NUC STRESS TID: 0.91
RATE: 0.26
Rest HR: 126 {beats}/min
SDS: 1
SRS: 0

## 2015-11-21 LAB — URINE CULTURE: Colony Count: 30000

## 2015-11-21 MED ORDER — TECHNETIUM TC 99M SESTAMIBI GENERIC - CARDIOLITE
10.7000 | Freq: Once | INTRAVENOUS | Status: AC | PRN
  Administered 2015-11-21: 11 via INTRAVENOUS

## 2015-11-21 MED ORDER — AMOXICILLIN 250 MG PO CAPS: ORAL_CAPSULE | ORAL | Status: AC

## 2015-11-21 MED ORDER — REGADENOSON 0.4 MG/5ML IV SOLN
0.4000 mg | Freq: Once | INTRAVENOUS | Status: AC
  Administered 2015-11-21: 0.4 mg via INTRAVENOUS

## 2015-11-21 MED ORDER — TECHNETIUM TC 99M SESTAMIBI GENERIC - CARDIOLITE
30.6000 | Freq: Once | INTRAVENOUS | Status: AC | PRN
  Administered 2015-11-21: 31 via INTRAVENOUS

## 2015-11-24 ENCOUNTER — Other Ambulatory Visit: Payer: Self-pay | Admitting: Internal Medicine

## 2015-11-24 ENCOUNTER — Ambulatory Visit (HOSPITAL_BASED_OUTPATIENT_CLINIC_OR_DEPARTMENT_OTHER): Payer: Medicare Other | Admitting: Hematology

## 2015-11-24 ENCOUNTER — Telehealth: Payer: Self-pay | Admitting: Hematology

## 2015-11-24 ENCOUNTER — Other Ambulatory Visit (HOSPITAL_BASED_OUTPATIENT_CLINIC_OR_DEPARTMENT_OTHER): Payer: Medicare Other

## 2015-11-24 ENCOUNTER — Encounter: Payer: Self-pay | Admitting: Hematology

## 2015-11-24 VITALS — BP 123/75 | HR 91 | Temp 97.9°F | Resp 17 | Ht 61.0 in | Wt 144.0 lb

## 2015-11-24 DIAGNOSIS — E538 Deficiency of other specified B group vitamins: Secondary | ICD-10-CM | POA: Diagnosis not present

## 2015-11-24 DIAGNOSIS — I480 Paroxysmal atrial fibrillation: Secondary | ICD-10-CM

## 2015-11-24 DIAGNOSIS — D649 Anemia, unspecified: Secondary | ICD-10-CM

## 2015-11-24 DIAGNOSIS — E039 Hypothyroidism, unspecified: Secondary | ICD-10-CM

## 2015-11-24 DIAGNOSIS — D500 Iron deficiency anemia secondary to blood loss (chronic): Secondary | ICD-10-CM

## 2015-11-24 DIAGNOSIS — C169 Malignant neoplasm of stomach, unspecified: Secondary | ICD-10-CM

## 2015-11-24 DIAGNOSIS — E611 Iron deficiency: Secondary | ICD-10-CM

## 2015-11-24 DIAGNOSIS — Q600 Renal agenesis, unilateral: Secondary | ICD-10-CM

## 2015-11-24 DIAGNOSIS — Z8542 Personal history of malignant neoplasm of other parts of uterus: Secondary | ICD-10-CM

## 2015-11-24 DIAGNOSIS — Z85048 Personal history of other malignant neoplasm of rectum, rectosigmoid junction, and anus: Secondary | ICD-10-CM

## 2015-11-24 HISTORY — DX: Iron deficiency anemia secondary to blood loss (chronic): D50.0

## 2015-11-24 LAB — COMPREHENSIVE METABOLIC PANEL
ALBUMIN: 3.1 g/dL — AB (ref 3.5–5.0)
ALK PHOS: 100 U/L (ref 40–150)
ALT: 10 U/L (ref 0–55)
AST: 15 U/L (ref 5–34)
Anion Gap: 8 mEq/L (ref 3–11)
BILIRUBIN TOTAL: 0.44 mg/dL (ref 0.20–1.20)
BUN: 20.9 mg/dL (ref 7.0–26.0)
CALCIUM: 9.4 mg/dL (ref 8.4–10.4)
CO2: 26 mEq/L (ref 22–29)
CREATININE: 1.3 mg/dL — AB (ref 0.6–1.1)
Chloride: 109 mEq/L (ref 98–109)
EGFR: 38 mL/min/{1.73_m2} — ABNORMAL LOW (ref >90)
Glucose: 85 mg/dl (ref 70–140)
POTASSIUM: 4.7 meq/L (ref 3.5–5.1)
Sodium: 143 mEq/L (ref 136–145)
TOTAL PROTEIN: 7.4 g/dL (ref 6.4–8.3)

## 2015-11-24 LAB — CBC & DIFF AND RETIC
BASO%: 0.5 % (ref 0.0–2.0)
BASOS ABS: 0 10*3/uL (ref 0.0–0.1)
EOS ABS: 0.3 10*3/uL (ref 0.0–0.5)
EOS%: 2.9 % (ref 0.0–7.0)
HEMATOCRIT: 40.4 % (ref 34.8–46.6)
HEMOGLOBIN: 12.1 g/dL (ref 11.6–15.9)
Immature Retic Fract: 11.6 % — ABNORMAL HIGH (ref 1.60–10.00)
LYMPH%: 18.5 % (ref 14.0–49.7)
MCH: 27.6 pg (ref 25.1–34.0)
MCHC: 30 g/dL — ABNORMAL LOW (ref 31.5–36.0)
MCV: 92 fL (ref 79.5–101.0)
MONO#: 0.7 10*3/uL (ref 0.1–0.9)
MONO%: 8.5 % (ref 0.0–14.0)
NEUT#: 5.9 10*3/uL (ref 1.5–6.5)
NEUT%: 69.6 % (ref 38.4–76.8)
Platelets: 346 10*3/uL (ref 145–400)
RBC: 4.39 10*6/uL (ref 3.70–5.45)
RDW: 21.1 % — AB (ref 11.2–14.5)
RETIC %: 4.06 % — AB (ref 0.70–2.10)
Retic Ct Abs: 178.23 10*3/uL — ABNORMAL HIGH (ref 33.70–90.70)
WBC: 8.5 10*3/uL (ref 3.9–10.3)
lymph#: 1.6 10*3/uL (ref 0.9–3.3)

## 2015-11-24 MED ORDER — CYANOCOBALAMIN 1000 MCG/ML IJ KIT
1000.0000 ug | PACK | INTRAMUSCULAR | Status: DC
Start: 2015-11-24 — End: 2016-10-27

## 2015-11-24 NOTE — Telephone Encounter (Signed)
Gave and printd appt sched and avs for pt for April

## 2015-11-25 DIAGNOSIS — Z7901 Long term (current) use of anticoagulants: Secondary | ICD-10-CM | POA: Diagnosis not present

## 2015-11-25 DIAGNOSIS — Z85528 Personal history of other malignant neoplasm of kidney: Secondary | ICD-10-CM | POA: Diagnosis not present

## 2015-11-25 DIAGNOSIS — I2510 Atherosclerotic heart disease of native coronary artery without angina pectoris: Secondary | ICD-10-CM | POA: Diagnosis not present

## 2015-11-25 DIAGNOSIS — A4189 Other specified sepsis: Secondary | ICD-10-CM | POA: Diagnosis not present

## 2015-11-25 DIAGNOSIS — I129 Hypertensive chronic kidney disease with stage 1 through stage 4 chronic kidney disease, or unspecified chronic kidney disease: Secondary | ICD-10-CM | POA: Diagnosis not present

## 2015-11-25 DIAGNOSIS — Z950 Presence of cardiac pacemaker: Secondary | ICD-10-CM | POA: Diagnosis not present

## 2015-11-25 DIAGNOSIS — J189 Pneumonia, unspecified organism: Secondary | ICD-10-CM | POA: Diagnosis not present

## 2015-11-25 DIAGNOSIS — K319 Disease of stomach and duodenum, unspecified: Secondary | ICD-10-CM | POA: Diagnosis not present

## 2015-11-25 DIAGNOSIS — I480 Paroxysmal atrial fibrillation: Secondary | ICD-10-CM | POA: Diagnosis not present

## 2015-11-25 DIAGNOSIS — N183 Chronic kidney disease, stage 3 (moderate): Secondary | ICD-10-CM | POA: Diagnosis not present

## 2015-11-26 ENCOUNTER — Encounter: Payer: Self-pay | Admitting: Internal Medicine

## 2015-11-26 ENCOUNTER — Ambulatory Visit (INDEPENDENT_AMBULATORY_CARE_PROVIDER_SITE_OTHER): Payer: Medicare Other | Admitting: Internal Medicine

## 2015-11-26 VITALS — BP 100/62 | HR 98 | Ht 61.0 in | Wt 148.4 lb

## 2015-11-26 DIAGNOSIS — C169 Malignant neoplasm of stomach, unspecified: Secondary | ICD-10-CM

## 2015-11-26 DIAGNOSIS — I495 Sick sinus syndrome: Secondary | ICD-10-CM

## 2015-11-26 DIAGNOSIS — I480 Paroxysmal atrial fibrillation: Secondary | ICD-10-CM | POA: Diagnosis not present

## 2015-11-26 MED ORDER — LOSARTAN POTASSIUM 50 MG PO TABS: 50.0000 mg | ORAL_TABLET | Freq: Every day | ORAL | Status: DC

## 2015-11-26 NOTE — Patient Instructions (Signed)
Medication Instructions:  Your physician has recommended you make the following change in your medication:  1) DECREASE Losartan to 50 mg daily  (new prescription sent to pharmacy for 50 mg tablets)  Labwork: None ordered  Testing/Procedures: None ordered  Follow-Up: Remote monitoring is used to monitor your Pacemaker of ICD from home. This monitoring reduces the number of office visits required to check your device to one time per year. It allows [place] to keep an eye on the functioning of your device to ensure it is working properly. You are scheduled for a device check from home on 02/25/2016. You may send your transmission at any time that day. If you have a wireless device, the transmission will be sent automatically. After your physician reviews your transmission, you will receive a postcard with your next transmission date.  Your physician wants you to follow-up in: 1 year with Chanetta Marshall, NP. You will receive a reminder letter in the mail two months in advance. If you don't receive a letter, please call our office to schedule the follow-up appointment.   If you need a refill on your cardiac medications before your next appointment, please call your pharmacy.  Thank you for choosing [facility]!!

## 2015-11-26 NOTE — Progress Notes (Signed)
Primary Cardiologist:  Dr [name] (previously Dr Mare Ferrari)  [name] has a h/o atrial fibrillation and tachycardia/ bradycardia syndrome sp PPM (MDT) by Dr Doreatha Lew.  She presents today for follow-up in the Electrophysiology device clinic.  She has atrial fibrillation which has become persistent.  She was seen in the AF clinic and plans were made to start flecainide.  Unfortunately, she was admitted prior to starting flecainide and diagnosed with a gastric mass.  She has been taken off of anticoagulation due to bleeding.  She is having PET scans/ staging for her mass and consideration is being made for surgery.  She has fatigue and SOB.  Today, she  denies symptoms of palpitations, chest pain, orthopnea, PND, lower extremity edema, presyncope, syncope, or neurologic sequela.  The patientis tolerating medications without difficulties and is otherwise without complaint today.   Past Medical History  Diagnosis Date  . PAF (paroxysmal atrial fibrillation) ([place])     controlled with amiodarone, on coumadin  . CAD (coronary artery disease)   . Aortic root dilatation ([place])   . Moderate aortic stenosis   . Chronic anticoagulation   . Tachycardia-bradycardia syndrome ([place])     s/p PPM by Dr Doreatha Lew (MDT) 07/03/10  . GERD (gastroesophageal reflux disease)   . Pacemaker     2012  . Neuropathy ([place])     feet/legs  . Restless leg syndrome   . Headache(784.0)     hx migraines - takes Zoloft for migraines  . History of uterine cancer 1980s    treated with hysterectomy, external radiation and radiation seed implants.   . Cancer ([place])     hx Kidney cancer / hx of endometrial cancer  . PONV (postoperative nausea and vomiting)   . Anxiety   . Heart murmur   . HTN (hypertension)   . Hyperlipemia   . Hypothyroidism   . Osteomyelitis ([place]) as child  . Family history of anesthesia complication     daughter has ponv  . Syncope 05/28/2014  . Hepatic steatosis   . Elevated LFTs   . Gastroparesis   .  Anal fissure   . Radiation proctitis   . Gastric polyp     adenomatous  . Neuromuscular disorder (HCC)     neuropathy in right leg  . Chronic kidney disease     only has one kidney   . 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 Adenocarcinoma of stomach ([place]) 11/11/2015    Past Surgical History  Procedure Laterality Date  . Cardiac catheterization  2008  . Nephrectomy Left     renal cell cancer  . Total abdominal hysterectomy    . Patent ductus arterious repair  age 19  . Ptvp    . Pacemaker insertion  07/04/11    by Dr Doreatha Lew  . Cholecystectomy  1970s  . Upper gastrointestinal endoscopy    . Total knee arthroplasty  02/07/2012    Procedure: TOTAL KNEE ARTHROPLASTY;  Surgeon: Mauri Pole, MD;  [facility];  Service: Orthopedics;  Laterality: Right;  . Insert / replace / remove pacemaker    . Total hip revision Right 2009  . I&d knee with poly exchange Right 12/17/2013    Procedure: IRRIGATION AND DEBRIDEMEN RIGHT TOTAL  KNEE WITH POLY EXCHANGE;  Surgeon: Mauri Pole, MD;  [facility];  Service: Orthopedics;  Laterality: Right;  . Patellar tendon repair Right 03/06/2014    Procedure: AVULSION WITH PATELLA TENDON REPAIR;  Surgeon: Mauri Pole, MD;  [facility];  Service: Orthopedics;  Laterality: Right;  . Total knee revision Right 07/29/2014    Procedure: RIGHT KNEE REVISION OF PREVIOUS REPAIR EXTENSOR MECHANISM;  Surgeon: Mauri Pole, MD;  [facility];  Service: Orthopedics;  Laterality: Right;  . Colonoscopy N/A 11/11/2015    Procedure: COLONOSCOPY;  Surgeon: [name], MD;  [facility];  Service: Endoscopy;  Laterality: N/A;  . Esophagogastroduodenoscopy N/A 11/11/2015    Procedure: ESOPHAGOGASTRODUODENOSCOPY (EGD);  Surgeon: [name], MD;  Location: [facility];  Service: Endoscopy;  Laterality: N/A;    Social History   Social History  . Marital Status: Married    Spouse Name: N/A  . Number of Children: N/A  . Years of Education: N/A    Occupational History  . Not on file.   Social History Main Topics  . Smoking status: Never Smoker   . Smokeless tobacco: Never Used  . Alcohol Use: No  . Drug Use: No  . Sexual Activity: No   Other Topics Concern  . Not on file   Social History Narrative   Lives in [place].  Continues to work.    Family History  Problem Relation Age of Onset  . Colon cancer Mother 47  . Heart disease Mother   . Heart disease Father   . Heart attack Father   . Heart disease Maternal Grandmother     Allergies  Allergen Reactions  . Amiodarone      Thyroid and liver and kidney problems  . Codeine Nausea And Vomiting  . Gabapentin Itching  . Hydrocodone Other (See Comments)    hallucination  . Morphine And Related Nausea And Vomiting  . Requip [Ropinirole Hcl]     Headache   . Zinc     nausea     Current outpatient prescriptions:  .  ALPRAZolam (XANAX) 0.5 MG tablet, TAKE 1/2 TO 1 TABLET BY MOUTH 3 TIMES A DAY AS NEEDED FOR ANXIETY, Disp: 90 tablet, Rfl: 5 .  amoxicillin (AMOXIL) 250 MG capsule, Take 1 capsule 3 x day after meals for enterococcus UTI, Disp: 30 capsule, Rfl: 0 .  atenolol (TENORMIN) 100 MG tablet, Take 1 tablet (100 mg total) by mouth 2 (two) times daily., Disp: 60 tablet, Rfl: 11 .  B Complex-C (B-COMPLEX WITH VITAMIN C) tablet, Take 1 tablet by mouth daily., Disp: 30 tablet, Rfl: 0 .  Biotin 5000 MCG CAPS, Take 10,000 mcg by mouth every morning. , Disp: , Rfl:  .  Cholecalciferol (VITAMIN D PO), Take 5,000 Units by mouth every morning. , Disp: , Rfl:  .  Cyanocobalamin 1000 MCG/ML KIT, Inject 1,000 mcg into the skin as directed., Disp: 6 kit, Rfl: 1 .  furosemide (LASIX) 40 MG tablet, Take 1 tablet (40 mg total) by mouth daily., Disp: 30 tablet, Rfl: 0 .  guaiFENesin (MUCINEX) 600 MG 12 hr tablet, Take 1,200 mg by mouth 2 (two) times daily., Disp: , Rfl:  .  levothyroxine (SYNTHROID, LEVOTHROID) 100 MCG tablet, TAKE 1 TABLET BY MOUTH EVERY DAY ON EMPTY  STOMACH WITH A FULL GLASS OF WATER (Patient taking differently: TAKE 1/2 TABLET BY MOUTH EVERY DAY ON EMPTY STOMACH WITH A FULL GLASS OF WATER), Disp: 90 tablet, Rfl: 1 .  losartan (COZAAR) 50 MG tablet, Take 1 tablet (50 mg total) by mouth daily., Disp: 30 tablet, Rfl: 11 .  nitroGLYCERIN (NITROSTAT) 0.4 MG SL tablet, Place 1 tablet (0.4 mg total) under the tongue every 5 (five) minutes as needed for chest pain (  MAX 3 TABLETS)., Disp: 25 tablet, Rfl: prn .  pantoprazole (PROTONIX) 40 MG tablet, Take 1 tablet (40 mg total) by mouth 2 (two) times daily., Disp: 60 tablet, Rfl: 0 .  potassium chloride SA (K-DUR,KLOR-CON) 20 MEQ tablet, Take 20 mEq by mouth 2 (two) times daily., Disp: , Rfl:  .  pregabalin (LYRICA) 75 MG capsule, Take 1 capsule (75 mg total) by mouth 2 (two) times daily., Disp: 60 capsule, Rfl: 5 .  sertraline (ZOLOFT) 50 MG tablet, Take 1 tablet (50 mg total) by mouth daily. For mood, Disp: 90 tablet, Rfl: 1 .  sucralfate (CARAFATE) 1 g tablet, Take 1 tablet (1 g total) by mouth 4 (four) times daily -  with meals and at bedtime., Disp: 120 tablet, Rfl: 0 .  traMADol (ULTRAM) 50 MG tablet, Take 50 mg by mouth every 6 (six) hours as needed (pain). Reported on 10/14/2015, Disp: , Rfl:  .  triamcinolone cream (KENALOG) 0.1 %, APPLY TO AFFECTED AREA TWICE A DAY, Disp: 45 g, Rfl: 2  Physical Exam: Filed Vitals:   11/26/15 1514  BP: 100/62  Pulse: 98  Height: _0  (1.549 m)  Weight: 148 lb 6.4 oz (67.314 kg)    GEN- The patient is well appearing, alert and oriented x 3 today.   Head- normocephalic, atraumatic Eyes-  Sclera clear, conjunctiva pink Ears- hearing intact Oropharynx- clear Neck- supple,   Lungs- Clear to ausculation bilaterally, normal work of breathing Chest- R sided pacemaker pocket is well healed Heart- irregular rate and rhythm, 2/6 diastolic murmur at apex, 3/6 SEM at LLSB GI- soft, NT, ND, + BS Extremities- no clubbing, cyanosis, or edema  Device interrogation  is reviewed and normal today myoview and echo reviewed with patient  A/P 1. Tachy/brady syndrome Normal pacemaker function See Pace Art report No changes today  2. Afib Stable (af burden 36%-->3% when I saw her last) Currently off of anticoagulation Hopefully this can be resumed once she is s/p cancer resection. Currently, reasonably rate controlled.  Not a candidate for rhythm control until she is back on anticoagulation  3. Hypertension BP is low today Reduce losartan to 31m daily  4. Gastric ca Ok to proceed with surgery if medically necessary from an EP standpoint. Will likely require perioperative cardizem IV for rate control when unable to take POs  5. Valerie/ pulmonary hypertension Will defer to Dr JMartinique Return in 1 year to see EP NP   Follow-up with Dr JMartiniqueas scheduled carelink every 3 months

## 2015-11-27 DIAGNOSIS — A4189 Other specified sepsis: Secondary | ICD-10-CM | POA: Diagnosis not present

## 2015-11-27 DIAGNOSIS — N183 Chronic kidney disease, stage 3 (moderate): Secondary | ICD-10-CM | POA: Diagnosis not present

## 2015-11-27 DIAGNOSIS — J189 Pneumonia, unspecified organism: Secondary | ICD-10-CM | POA: Diagnosis not present

## 2015-11-27 DIAGNOSIS — I2510 Atherosclerotic heart disease of native coronary artery without angina pectoris: Secondary | ICD-10-CM | POA: Diagnosis not present

## 2015-11-27 DIAGNOSIS — I129 Hypertensive chronic kidney disease with stage 1 through stage 4 chronic kidney disease, or unspecified chronic kidney disease: Secondary | ICD-10-CM | POA: Diagnosis not present

## 2015-11-27 DIAGNOSIS — Z85528 Personal history of other malignant neoplasm of kidney: Secondary | ICD-10-CM | POA: Diagnosis not present

## 2015-11-27 DIAGNOSIS — Z950 Presence of cardiac pacemaker: Secondary | ICD-10-CM | POA: Diagnosis not present

## 2015-11-27 DIAGNOSIS — Z7901 Long term (current) use of anticoagulants: Secondary | ICD-10-CM | POA: Diagnosis not present

## 2015-11-27 DIAGNOSIS — I480 Paroxysmal atrial fibrillation: Secondary | ICD-10-CM | POA: Diagnosis not present

## 2015-11-27 DIAGNOSIS — K319 Disease of stomach and duodenum, unspecified: Secondary | ICD-10-CM | POA: Diagnosis not present

## 2015-11-27 LAB — CUP PACEART INCLINIC DEVICE CHECK
Battery Impedance: 466 Ohm
Brady Statistic AP VS Percent: 61 %
Brady Statistic AS VP Percent: 0 %
Brady Statistic AS VS Percent: 39 %
Date Time Interrogation Session: 20170322182438
Implantable Lead Location: 753860
Implantable Lead Model: 4469
Implantable Lead Serial Number: 548229
Lead Channel Pacing Threshold Amplitude: 0.75 V
Lead Channel Pacing Threshold Pulse Width: 0.4 ms
Lead Channel Pacing Threshold Pulse Width: 0.4 ms
Lead Channel Sensing Intrinsic Amplitude: 1.4 mV
Lead Channel Sensing Intrinsic Amplitude: 8 mV
Lead Channel Setting Pacing Amplitude: 2 V
Lead Channel Setting Pacing Pulse Width: 0.4 ms
MDC IDC LEAD IMPLANT DT: 20111027
MDC IDC LEAD IMPLANT DT: 20111027
MDC IDC LEAD LOCATION: 753859
MDC IDC LEAD SERIAL: 686026
MDC IDC MSMT BATTERY REMAINING LONGEVITY: 92 mo
MDC IDC MSMT BATTERY VOLTAGE: 2.8 V
MDC IDC MSMT LEADCHNL RA IMPEDANCE VALUE: 484 Ohm
MDC IDC MSMT LEADCHNL RV IMPEDANCE VALUE: 438 Ohm
MDC IDC MSMT LEADCHNL RV PACING THRESHOLD AMPLITUDE: 0.75 V
MDC IDC SET LEADCHNL RV PACING AMPLITUDE: 2.5 V
MDC IDC SET LEADCHNL RV SENSING SENSITIVITY: 2.8 mV
MDC IDC STAT BRADY AP VP PERCENT: 0 %

## 2015-11-28 ENCOUNTER — Ambulatory Visit: Payer: Medicare Other | Admitting: Physician Assistant

## 2015-11-28 ENCOUNTER — Telehealth: Payer: Self-pay | Admitting: *Deleted

## 2015-11-28 NOTE — Telephone Encounter (Signed)
Called and informed patient's daughter per [name] that she does not need to keep today's appointment as long as she is feeling okay . She was seen and evaluated by Dr Rayann Heman 2 days ago.  Her daughter states that she is doing fine. No new symptoms or concerns. The daughter states that all problems were addressed by Dr Rayann Heman. She agrees with canceling todays appointment.

## 2015-11-29 ENCOUNTER — Other Ambulatory Visit: Payer: Self-pay | Admitting: Internal Medicine

## 2015-12-01 ENCOUNTER — Ambulatory Visit (HOSPITAL_COMMUNITY)
Admission: RE | Admit: 2015-12-01 | Discharge: 2015-12-01 | Disposition: A | Discharge status: Home / Self Care | Payer: Medicare Other | Source: Ambulatory Visit | Attending: Hematology | Admitting: Hematology

## 2015-12-01 DIAGNOSIS — I2510 Atherosclerotic heart disease of native coronary artery without angina pectoris: Secondary | ICD-10-CM | POA: Insufficient documentation

## 2015-12-01 DIAGNOSIS — R599 Enlarged lymph nodes, unspecified: Secondary | ICD-10-CM | POA: Diagnosis not present

## 2015-12-01 DIAGNOSIS — Z905 Acquired absence of kidney: Secondary | ICD-10-CM | POA: Diagnosis not present

## 2015-12-01 DIAGNOSIS — Z96641 Presence of right artificial hip joint: Secondary | ICD-10-CM | POA: Insufficient documentation

## 2015-12-01 DIAGNOSIS — Z9049 Acquired absence of other specified parts of digestive tract: Secondary | ICD-10-CM | POA: Insufficient documentation

## 2015-12-01 DIAGNOSIS — C169 Malignant neoplasm of stomach, unspecified: Secondary | ICD-10-CM | POA: Insufficient documentation

## 2015-12-01 LAB — GLUCOSE, CAPILLARY: Glucose-Capillary: 89 mg/dL (ref 65–99)

## 2015-12-01 MED ORDER — FLUDEOXYGLUCOSE F - 18 (FDG) INJECTION
7.3000 | Freq: Once | INTRAVENOUS | Status: AC | PRN
  Administered 2015-12-01: 7.3 via INTRAVENOUS

## 2015-12-01 NOTE — Telephone Encounter (Signed)
RX CALLED INTO [facility]. 

## 2015-12-02 DIAGNOSIS — M6250 Muscle wasting and atrophy, not elsewhere classified, unspecified site: Secondary | ICD-10-CM | POA: Diagnosis not present

## 2015-12-02 DIAGNOSIS — I2510 Atherosclerotic heart disease of native coronary artery without angina pectoris: Secondary | ICD-10-CM | POA: Diagnosis not present

## 2015-12-02 DIAGNOSIS — N183 Chronic kidney disease, stage 3 (moderate): Secondary | ICD-10-CM | POA: Diagnosis not present

## 2015-12-02 DIAGNOSIS — Z950 Presence of cardiac pacemaker: Secondary | ICD-10-CM | POA: Diagnosis not present

## 2015-12-02 DIAGNOSIS — J189 Pneumonia, unspecified organism: Secondary | ICD-10-CM | POA: Diagnosis not present

## 2015-12-02 DIAGNOSIS — A4189 Other specified sepsis: Secondary | ICD-10-CM | POA: Diagnosis not present

## 2015-12-02 DIAGNOSIS — I129 Hypertensive chronic kidney disease with stage 1 through stage 4 chronic kidney disease, or unspecified chronic kidney disease: Secondary | ICD-10-CM | POA: Diagnosis not present

## 2015-12-02 DIAGNOSIS — K319 Disease of stomach and duodenum, unspecified: Secondary | ICD-10-CM | POA: Diagnosis not present

## 2015-12-02 DIAGNOSIS — Z85528 Personal history of other malignant neoplasm of kidney: Secondary | ICD-10-CM | POA: Diagnosis not present

## 2015-12-02 DIAGNOSIS — Z7901 Long term (current) use of anticoagulants: Secondary | ICD-10-CM | POA: Diagnosis not present

## 2015-12-02 DIAGNOSIS — I480 Paroxysmal atrial fibrillation: Secondary | ICD-10-CM | POA: Diagnosis not present

## 2015-12-04 ENCOUNTER — Other Ambulatory Visit: Payer: Self-pay | Admitting: *Deleted

## 2015-12-04 DIAGNOSIS — Z7901 Long term (current) use of anticoagulants: Secondary | ICD-10-CM | POA: Diagnosis not present

## 2015-12-04 DIAGNOSIS — Z85528 Personal history of other malignant neoplasm of kidney: Secondary | ICD-10-CM | POA: Diagnosis not present

## 2015-12-04 DIAGNOSIS — C169 Malignant neoplasm of stomach, unspecified: Secondary | ICD-10-CM

## 2015-12-04 DIAGNOSIS — I480 Paroxysmal atrial fibrillation: Secondary | ICD-10-CM | POA: Diagnosis not present

## 2015-12-04 DIAGNOSIS — K319 Disease of stomach and duodenum, unspecified: Secondary | ICD-10-CM | POA: Diagnosis not present

## 2015-12-04 DIAGNOSIS — I2510 Atherosclerotic heart disease of native coronary artery without angina pectoris: Secondary | ICD-10-CM | POA: Diagnosis not present

## 2015-12-04 DIAGNOSIS — Z950 Presence of cardiac pacemaker: Secondary | ICD-10-CM | POA: Diagnosis not present

## 2015-12-04 DIAGNOSIS — I129 Hypertensive chronic kidney disease with stage 1 through stage 4 chronic kidney disease, or unspecified chronic kidney disease: Secondary | ICD-10-CM | POA: Diagnosis not present

## 2015-12-04 DIAGNOSIS — A4189 Other specified sepsis: Secondary | ICD-10-CM | POA: Diagnosis not present

## 2015-12-04 DIAGNOSIS — J189 Pneumonia, unspecified organism: Secondary | ICD-10-CM | POA: Diagnosis not present

## 2015-12-04 DIAGNOSIS — N183 Chronic kidney disease, stage 3 (moderate): Secondary | ICD-10-CM | POA: Diagnosis not present

## 2015-12-08 ENCOUNTER — Other Ambulatory Visit: Payer: Self-pay | Admitting: Internal Medicine

## 2015-12-08 ENCOUNTER — Other Ambulatory Visit: Payer: Self-pay | Admitting: *Deleted

## 2015-12-08 ENCOUNTER — Encounter: Payer: Self-pay | Admitting: Hematology

## 2015-12-08 ENCOUNTER — Ambulatory Visit (HOSPITAL_BASED_OUTPATIENT_CLINIC_OR_DEPARTMENT_OTHER): Payer: Medicare Other | Admitting: Hematology

## 2015-12-08 ENCOUNTER — Other Ambulatory Visit (HOSPITAL_BASED_OUTPATIENT_CLINIC_OR_DEPARTMENT_OTHER): Payer: Medicare Other

## 2015-12-08 VITALS — BP 120/53 | HR 75 | Temp 98.2°F | Resp 18 | Ht 61.0 in | Wt 147.4 lb

## 2015-12-08 DIAGNOSIS — E538 Deficiency of other specified B group vitamins: Secondary | ICD-10-CM

## 2015-12-08 DIAGNOSIS — C169 Malignant neoplasm of stomach, unspecified: Secondary | ICD-10-CM

## 2015-12-08 DIAGNOSIS — I480 Paroxysmal atrial fibrillation: Secondary | ICD-10-CM

## 2015-12-08 DIAGNOSIS — Z8542 Personal history of malignant neoplasm of other parts of uterus: Secondary | ICD-10-CM

## 2015-12-08 DIAGNOSIS — I2510 Atherosclerotic heart disease of native coronary artery without angina pectoris: Secondary | ICD-10-CM

## 2015-12-08 DIAGNOSIS — E611 Iron deficiency: Secondary | ICD-10-CM

## 2015-12-08 DIAGNOSIS — Z85048 Personal history of other malignant neoplasm of rectum, rectosigmoid junction, and anus: Secondary | ICD-10-CM

## 2015-12-08 DIAGNOSIS — Q600 Renal agenesis, unilateral: Secondary | ICD-10-CM

## 2015-12-08 DIAGNOSIS — D649 Anemia, unspecified: Secondary | ICD-10-CM | POA: Diagnosis not present

## 2015-12-08 DIAGNOSIS — E440 Moderate protein-calorie malnutrition: Secondary | ICD-10-CM

## 2015-12-08 DIAGNOSIS — D509 Iron deficiency anemia, unspecified: Secondary | ICD-10-CM

## 2015-12-08 LAB — COMPREHENSIVE METABOLIC PANEL
ALT: 10 U/L (ref 0–55)
AST: 16 U/L (ref 5–34)
Albumin: 3.1 g/dL — ABNORMAL LOW (ref 3.5–5.0)
Alkaline Phosphatase: 104 U/L (ref 40–150)
Anion Gap: 7 mEq/L (ref 3–11)
BUN: 25.3 mg/dL (ref 7.0–26.0)
CHLORIDE: 111 meq/L — AB (ref 98–109)
CO2: 25 meq/L (ref 22–29)
CREATININE: 1.1 mg/dL (ref 0.6–1.1)
Calcium: 9.2 mg/dL (ref 8.4–10.4)
EGFR: 46 mL/min/{1.73_m2} — ABNORMAL LOW (ref >90)
GLUCOSE: 89 mg/dL (ref 70–140)
Potassium: 4.2 mEq/L (ref 3.5–5.1)
Sodium: 143 mEq/L (ref 136–145)
TOTAL PROTEIN: 7 g/dL (ref 6.4–8.3)

## 2015-12-08 LAB — CBC WITH DIFFERENTIAL/PLATELET
BASO%: 0.7 % (ref 0.0–2.0)
BASOS ABS: 0 10*3/uL (ref 0.0–0.1)
EOS ABS: 0.2 10*3/uL (ref 0.0–0.5)
EOS%: 3.8 % (ref 0.0–7.0)
HCT: 39.7 % (ref 34.8–46.6)
HGB: 12.2 g/dL (ref 11.6–15.9)
LYMPH%: 23.8 % (ref 14.0–49.7)
MCH: 28 pg (ref 25.1–34.0)
MCHC: 30.7 g/dL — AB (ref 31.5–36.0)
MCV: 91.1 fL (ref 79.5–101.0)
MONO#: 0.5 10*3/uL (ref 0.1–0.9)
MONO%: 8.3 % (ref 0.0–14.0)
NEUT#: 3.8 10*3/uL (ref 1.5–6.5)
NEUT%: 63.4 % (ref 38.4–76.8)
Platelets: 235 10*3/uL (ref 145–400)
RBC: 4.36 10*6/uL (ref 3.70–5.45)
RDW: 19.2 % — ABNORMAL HIGH (ref 11.2–14.5)
WBC: 6 10*3/uL (ref 3.9–10.3)
lymph#: 1.4 10*3/uL (ref 0.9–3.3)

## 2015-12-08 LAB — FERRITIN: FERRITIN: 65 ng/mL (ref 9–269)

## 2015-12-08 NOTE — Progress Notes (Signed)
[name]    HEMATOLOGY/ONCOLOGY CLINIC NOTE  Date of Service: .12/08/2015   Patient Care Team: Lucky Cowboy, MD as PCP - General (Internal Medicine)  CHIEF COMPLAINTS/PURPOSE OF CONSULTATION: Newly diagnosed gastric adenocarcinoma   HISTORY OF PRESENTING ILLNESS:  [name] is a wonderful 82 y.o. female who is here for her posthospitalization follow-up for newly diagnosed gastric adenocarcinoma. She had been admitted to the hospital on 11/07/2015 with fatigue and progressive dyspnea on exertion. He was noted to have sepsis secondary to Streptococcus viridans bacteremia/pneumonia. She was also noted to have significant iron deficiency anemia with fecal occult blood testing positive. Subsequent EGD showed a malignant gastric tumor with biopsy noted to show gastric adenocarcinoma. Colonoscopy was noted to be normal. Patient has had a CT scan of the chest and abdomen showed a nonspecifically enlarged pretracheal lymph node, pleural effusions , overt metastatic disease or perigastric lymph nodes. Patient was noted to have a hemoglobin of 8.6 and was treated with IV Feraheme x 2 doses + B12 + EPO. Helicobacter pylori IgG was negative. She was also noted to have B12 deficiency which was treated. She absolutely refuses blood transfusions. She was seen by GI surgery who wanted her to recover from her anemia, infections and have a cardiac clearance prior to consideration of surgery. She was discharged with an outpatient follow-up with Dr. Donell Beers.  Patient notes that since her discharge she was noted to have a urinary tract infection and started on amoxicillin. She had a nuclear stress test on 11/21/2015 which showed ejection fraction of 62% with no ischemia or infarction.  Patient notes brown stools with no evidence of overt GI bleeding. She has been off her anticoagulation for atrial fibrillation as a result of her gastric mass and bleeding. On follow-up in clinic today her hemoglobin has improved from  8.6 now up to 12.1 with normal platelets and WBC count.  INTERVAL HISTORY  Patient is here for her scheduled follow-up. She has been taking her vitamin B12 injections at home without any issues and energy levels have been good. Good oral intake. Has a follow-up with Dr. Donell Beers early next week. Had her ferritin levels checked today and they're still low at 65. She has been scheduled for additional IV iron.  We discussed her PET/CT results details with her daughters present.  MEDICAL HISTORY:  Past Medical History  Diagnosis Date  . PAF (paroxysmal atrial fibrillation) (HCC)     controlled with amiodarone, on coumadin  . CAD (coronary artery disease)   . Aortic root dilatation (HCC)   . Moderate aortic stenosis   . Chronic anticoagulation   . Tachycardia-bradycardia syndrome (HCC)     s/p PPM by Dr Deborah Chalk ([facility]) 07/03/10  . GERD (gastroesophageal reflux disease)   . Pacemaker     2012  . Neuropathy (HCC)     feet/legs  . Restless leg syndrome   . Headache(784.0)     hx migraines - takes Zoloft for migraines  . History of uterine cancer 1980s    treated with hysterectomy, external radiation and radiation seed implants.   . Cancer (HCC)     hx Kidney cancer / hx of endometrial cancer  . PONV (postoperative nausea and vomiting)   . Anxiety   . Heart murmur   . HTN (hypertension)   . Hyperlipemia   . Hypothyroidism   . Osteomyelitis (HCC) as child  . Family history of anesthesia complication     daughter has ponv  . Syncope 05/28/2014  . Hepatic  steatosis   . Elevated LFTs   . Gastroparesis   . Anal fissure   . Radiation proctitis   . Gastric polyp     adenomatous  . Neuromuscular disorder (HCC)     neuropathy in right leg  . Chronic kidney disease     only has one kidney   . 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 Adenocarcinoma of stomach ([place]) 11/11/2015    SURGICAL HISTORY: Past Surgical History  Procedure Laterality Date  . Cardiac catheterization  2008  . Nephrectomy Left      renal cell cancer  . Total abdominal hysterectomy    . Patent ductus arterious repair  age 83  . Ptvp    . Pacemaker insertion  07/04/11    by Dr Doreatha Lew  . Cholecystectomy  1970s  . Upper gastrointestinal endoscopy    . Total knee arthroplasty  02/07/2012    Procedure: TOTAL KNEE ARTHROPLASTY;  Surgeon: Mauri Pole, MD;  Location: [facility];  Service: Orthopedics;  Laterality: Right;  . Insert / replace / remove pacemaker    . Total hip revision Right 2009  . I&d knee with poly exchange Right 12/17/2013    Procedure: IRRIGATION AND DEBRIDEMEN RIGHT TOTAL  KNEE WITH POLY EXCHANGE;  Surgeon: Mauri Pole, MD;  Location: [facility];  Service: Orthopedics;  Laterality: Right;  . Patellar tendon repair Right 03/06/2014    Procedure: AVULSION WITH PATELLA TENDON REPAIR;  Surgeon: Mauri Pole, MD;  Location: [facility];  Service: Orthopedics;  Laterality: Right;  . Total knee revision Right 07/29/2014    Procedure: RIGHT KNEE REVISION OF PREVIOUS REPAIR EXTENSOR MECHANISM;  Surgeon: Mauri Pole, MD;  Location: [facility];  Service: Orthopedics;  Laterality: Right;  . Colonoscopy N/A 11/11/2015    Procedure: COLONOSCOPY;  Surgeon: [name], MD;  Location: [facility];  Service: Endoscopy;  Laterality: N/A;  . Esophagogastroduodenoscopy N/A 11/11/2015    Procedure: ESOPHAGOGASTRODUODENOSCOPY (EGD);  Surgeon: [name], MD;  Location: [facility];  Service: Endoscopy;  Laterality: N/A;    SOCIAL HISTORY: Social History   Social History  . Marital Status: Married    Spouse Name: N/A  . Number of Children: N/A  . Years of Education: N/A   Occupational History  . Not on file.   Social History Main Topics  . Smoking status: Never Smoker   . Smokeless tobacco: Never Used  . Alcohol Use: No  . Drug Use: No  . Sexual Activity: No   Other Topics Concern  . Not on file   Social History Narrative   Lives in [place].  Continues to work.    FAMILY HISTORY: Family History    Problem Relation Age of Onset  . Colon cancer Mother 65  . Heart disease Mother   . Heart disease Father   . Heart attack Father   . Heart disease Maternal Grandmother     ALLERGIES:  is allergic to amiodarone; codeine; gabapentin; hydrocodone; morphine and related; requip; and zinc.  MEDICATIONS:  Current Outpatient Prescriptions  Medication Sig Dispense Refill  . atenolol (TENORMIN) 100 MG tablet Take 1 tablet (100 mg total) by mouth 2 (two) times daily. 60 tablet 11  . B Complex-C (B-COMPLEX WITH VITAMIN C) tablet Take 1 tablet by mouth daily. 30 tablet 0  . Biotin 5000 MCG CAPS Take 10,000 mcg by mouth every morning.     . Cholecalciferol (VITAMIN D PO) Take 5,000 Units by mouth every morning.     [name]  Cyanocobalamin 1000 MCG/ML KIT Inject 1,000 mcg into the skin as directed. 6 kit 1  . furosemide (LASIX) 40 MG tablet Take 1 tablet (40 mg total) by mouth daily. 30 tablet 0  . guaiFENesin (MUCINEX) 600 MG 12 hr tablet Take 1,200 mg by mouth 2 (two) times daily.    [name] levothyroxine (SYNTHROID, LEVOTHROID) 100 MCG tablet TAKE 1 TABLET BY MOUTH EVERY DAY ON EMPTY STOMACH WITH A FULL GLASS OF WATER (Patient taking differently: TAKE 1/2 TABLET BY MOUTH EVERY DAY ON EMPTY STOMACH WITH A FULL GLASS OF WATER) 90 tablet 1  . losartan (COZAAR) 50 MG tablet Take 1 tablet (50 mg total) by mouth daily. 30 tablet 11  . LYRICA 75 MG capsule TAKE ONE CAPSULE BY MOUTH TWICE A DAY 180 capsule 1  . nitroGLYCERIN (NITROSTAT) 0.4 MG SL tablet Place 1 tablet (0.4 mg total) under the tongue every 5 (five) minutes as needed for chest pain (MAX 3 TABLETS). 25 tablet prn  . pantoprazole (PROTONIX) 40 MG tablet Take 1 tablet (40 mg total) by mouth 2 (two) times daily. 60 tablet 0  . potassium chloride SA (K-DUR,KLOR-CON) 20 MEQ tablet Take 20 mEq by mouth 2 (two) times daily.    . sertraline (ZOLOFT) 50 MG tablet Take 1 tablet (50 mg total) by mouth daily. For mood 90 tablet 1  . sucralfate (CARAFATE) 1 g tablet  Take 1 tablet (1 g total) by mouth 4 (four) times daily -  with meals and at bedtime. 120 tablet 0  . traMADol (ULTRAM) 50 MG tablet Take 50 mg by mouth every 6 (six) hours as needed (pain). Reported on 10/14/2015    . triamcinolone cream (KENALOG) 0.1 % APPLY TO AFFECTED AREA TWICE A DAY 45 g 2   No current facility-administered medications for this visit.    REVIEW OF SYSTEMS:    10 Point review of Systems was done is negative except as noted above.  PHYSICAL EXAMINATION: ECOG PERFORMANCE STATUS: 2 - Symptomatic, <50% confined to bed  . Filed Vitals:   12/08/15 1255  BP: 120/53  Pulse: 75  Temp: 98.2 F (36.8 C)  Resp: 18   Filed Weights   12/08/15 1255  Weight: 147 lb 6.4 oz (66.86 kg)   .Body mass index is 27.87 kg/(m^2).  GENERAL:Elderly lady, alert, in no acute distress and comfortable SKIN: skin color, texture, turgor are normal, no rashes or significant lesions EYES: normal, conjunctiva are pink and non-injected, sclera clear OROPHARYNX:no exudate, no erythema and lips, buccal mucosa, and tongue normal  NECK: supple, no JVD, thyroid normal size, non-tender, without nodularity LYMPH:  no palpable lymphadenopathy in the cervical, axillary or inguinal LUNGS: clear to auscultation with normal respiratory effort HEART: regular rate & rhythm,  no murmurs and no lower extremity edema ABDOMEN: abdomen soft, non-tender, normoactive bowel sounds  Musculoskeletal: no cyanosis of digits and no clubbing  PSYCH: alert & oriented x 3 with fluent speech NEURO: no focal motor/sensory deficits  LABORATORY DATA:  I have reviewed the data as listed  . CBC Latest Ref Rng 12/08/2015 11/24/2015 11/18/2015  WBC 3.9 - 10.3 10e3/uL 6.0 8.5 11.0(H)  Hemoglobin 11.6 - 15.9 g/dL 57.5 50.7 10.7(L)  Hematocrit 34.8 - 46.6 % 39.7 40.4 33.5(L)  Platelets 145 - 400 10e3/uL 235 346 404(H)    . CMP Latest Ref Rng 12/08/2015 11/24/2015 11/18/2015  Glucose 70 - 140 mg/dl 89 85 98  BUN 7.0 - 99.3  mg/dL 33.8 65.6 11  Creatinine 0.6 - 1.1  mg/dL 1.1 1.3(H) 0.92  Sodium 136 - 145 mEq/L 143 143 141  Potassium 3.5 - 5.1 mEq/L 4.2 4.7 3.9  Chloride 98 - 110 mmol/L - - 104  CO2 22 - 29 mEq/L '25 26 22  '$ Calcium 8.4 - 10.4 mg/dL 9.2 9.4 9.1  Total Protein 6.4 - 8.3 g/dL 7.0 7.4 -  Total Bilirubin 0.20 - 1.20 mg/dL <0.30 0.44 -  Alkaline Phos 40 - 150 U/L 104 100 -  AST 5 - 34 U/L 16 15 -  ALT 0 - 55 U/L 10 10 -   . Lab Results  Component Value Date   IRON 14* 11/09/2015   TIBC 357 11/09/2015   IRONPCTSAT 4* 11/09/2015   (Iron and TIBC)  Lab Results  Component Value Date   FERRITIN 65 12/08/2015         RADIOGRAPHIC STUDIES: I have personally reviewed the radiological images as listed and agreed with the findings in the report. Dg Chest 2 View  11/14/2015  CLINICAL DATA:  Shortness of breath and pleural effusions EXAM: CHEST  2 VIEW COMPARISON:  11/12/2015 FINDINGS: Cardiac shadow remains enlarged. A pacing device is again seen. Bilateral pleural effusions left greater than right are noted. No focal infiltrate is seen. No bony abnormality is noted. IMPRESSION: Stable bilateral pleural effusions. Electronically Signed   By: Inez Catalina M.D.   On: 11/14/2015 14:20   Dg Knee 1-2 Views Right  11/08/2015  CLINICAL DATA:  Pain for 2 days. No recent trauma. Total knee replacement 3 years ago. EXAM: RIGHT KNEE - 1-2 VIEW COMPARISON:  None. FINDINGS: The patient is status post knee replacement. Hardware is in good position with no loosening or failure identified. Surgical screws and metallic tacks project over the proximal tibia, also in good position. No joint effusion. Mild anterior soft tissue swelling is seen. No bony erosion or bony lesions. No fractures are seen. IMPRESSION: Mild anterior soft tissue swelling.  No other abnormalities. Electronically Signed   By: Dorise Bullion III M.D   On: 11/08/2015 16:00   Ct Chest W Contrast  11/12/2015  CLINICAL DATA:  Progressively worsening  dyspnea. Subjective fever, chills and cough. Malaise and decreased oral intake. Gastric mass on endoscopy. EXAM: CT CHEST, ABDOMEN, AND PELVIS WITH CONTRAST TECHNIQUE: Multidetector CT imaging of the chest, abdomen and pelvis was performed following the standard protocol during bolus administration of intravenous contrast. CONTRAST:  69m OMNIPAQUE IOHEXOL 300 MG/ML SOLN, 1072mOMNIPAQUE IOHEXOL 300 MG/ML SOLN COMPARISON:  Chest CT five days prior 11/07/2015. Most recent CT abdomen/ pelvis 05/31/2014 FINDINGS: CT CHEST FINDINGS Mediastinum/Lymph Nodes: Enlarged precarinal lymph node measures 1.5 cm, previously 1.1 cm. Additional small mediastinal and hilar lymph nodes are not enlarged by size criteria. Aneurysmal dilatation of the ascending aorta, 3.8 cm currently, differences likely secondary to caliper placement. No aortic dissection. Coronary artery calcifications are seen. Pacemaker in place. Mitral annulus calcifications. No pericardial effusion. Lungs/Pleura: Progression in bilateral pleural effusions, now moderate in degree. There is adjacent compressive atelectasis in both lower lobes. Calcified granuloma in the right lung is unchanged. Musculoskeletal: There are no acute or suspicious osseous abnormalities. Multilevel degenerative change in the thoracic spine. CT ABDOMEN PELVIS FINDINGS Hepatobiliary: No focal hepatic lesion. Postcholecystectomy with unchanged prominence of the common bile duct measuring 11 mm. No calcified choledocholithiasis. Pancreas: No ductal dilatation or inflammation. Spleen: Normal in size and density, no focal abnormality. Adrenals/Urinary Tract: There is no adrenal nodule. Patient is post left nephrectomy. Homogeneous attenuation throughout the right kidney  without hydronephrosis. Urinary bladder is minimally distended. Stomach/Bowel: Irregular wall thickening involving the gastric fundus. More distal stomach is decompressed. No definite perigastric lymphadenopathy. There are no  dilated or thickened bowel loops. Contrast and stool throughout the colon without abnormal distension. No bowel obstruction, oral contrast reaches the rectum. Appendix is not visualized. Vascular/Lymphatic: Dense atherosclerosis of the abdominal aorta without aneurysm. Small retroperitoneal lymph nodes, not enlarged by size criteria. Reproductive: Uterus is surgically absent. No gross adnexal mass, detailed pelvic evaluation partially obscured by streak artifact from right hip prosthesis. Other: Whole body wall edema. There is mild presacral edema, chronic. Post bilateral pelvic lymph node dissection. No free intra-abdominal air or free fluid. Musculoskeletal: Right hip arthroplasty. There are no acute or suspicious osseous abnormalities. Multilevel degenerative change in the lumbar spine. IMPRESSION: 1. Progressive bilateral pleural effusions since exam 5 days prior, now moderate in degree. There is adjacent compressive atelectasis in the lower lobes. 2. Minimal increased size in the prominent pretracheal lymph node, nonspecific, increased size likely reactive. 3. Irregular gastric wall thickening involving the fundus, concerning for malignancy. No definite perigastric adenopathy. No evidence of metastatic disease in the abdomen and pelvis. 4. Post left nephrectomy, cholecystectomy, and hysterectomy. Electronically Signed   By: Jeb Levering M.D.   On: 11/12/2015 23:55   Ct Abdomen Pelvis W Contrast  11/12/2015  CLINICAL DATA:  Progressively worsening dyspnea. Subjective fever, chills and cough. Malaise and decreased oral intake. Gastric mass on endoscopy. EXAM: CT CHEST, ABDOMEN, AND PELVIS WITH CONTRAST TECHNIQUE: Multidetector CT imaging of the chest, abdomen and pelvis was performed following the standard protocol during bolus administration of intravenous contrast. CONTRAST:  35m OMNIPAQUE IOHEXOL 300 MG/ML SOLN, 1084mOMNIPAQUE IOHEXOL 300 MG/ML SOLN COMPARISON:  Chest CT five days prior 11/07/2015.  Most recent CT abdomen/ pelvis 05/31/2014 FINDINGS: CT CHEST FINDINGS Mediastinum/Lymph Nodes: Enlarged precarinal lymph node measures 1.5 cm, previously 1.1 cm. Additional small mediastinal and hilar lymph nodes are not enlarged by size criteria. Aneurysmal dilatation of the ascending aorta, 3.8 cm currently, differences likely secondary to caliper placement. No aortic dissection. Coronary artery calcifications are seen. Pacemaker in place. Mitral annulus calcifications. No pericardial effusion. Lungs/Pleura: Progression in bilateral pleural effusions, now moderate in degree. There is adjacent compressive atelectasis in both lower lobes. Calcified granuloma in the right lung is unchanged. Musculoskeletal: There are no acute or suspicious osseous abnormalities. Multilevel degenerative change in the thoracic spine. CT ABDOMEN PELVIS FINDINGS Hepatobiliary: No focal hepatic lesion. Postcholecystectomy with unchanged prominence of the common bile duct measuring 11 mm. No calcified choledocholithiasis. Pancreas: No ductal dilatation or inflammation. Spleen: Normal in size and density, no focal abnormality. Adrenals/Urinary Tract: There is no adrenal nodule. Patient is post left nephrectomy. Homogeneous attenuation throughout the right kidney without hydronephrosis. Urinary bladder is minimally distended. Stomach/Bowel: Irregular wall thickening involving the gastric fundus. More distal stomach is decompressed. No definite perigastric lymphadenopathy. There are no dilated or thickened bowel loops. Contrast and stool throughout the colon without abnormal distension. No bowel obstruction, oral contrast reaches the rectum. Appendix is not visualized. Vascular/Lymphatic: Dense atherosclerosis of the abdominal aorta without aneurysm. Small retroperitoneal lymph nodes, not enlarged by size criteria. Reproductive: Uterus is surgically absent. No gross adnexal mass, detailed pelvic evaluation partially obscured by streak  artifact from right hip prosthesis. Other: Whole body wall edema. There is mild presacral edema, chronic. Post bilateral pelvic lymph node dissection. No free intra-abdominal air or free fluid. Musculoskeletal: Right hip arthroplasty. There are no acute or suspicious osseous abnormalities.  Multilevel degenerative change in the lumbar spine. IMPRESSION: 1. Progressive bilateral pleural effusions since exam 5 days prior, now moderate in degree. There is adjacent compressive atelectasis in the lower lobes. 2. Minimal increased size in the prominent pretracheal lymph node, nonspecific, increased size likely reactive. 3. Irregular gastric wall thickening involving the fundus, concerning for malignancy. No definite perigastric adenopathy. No evidence of metastatic disease in the abdomen and pelvis. 4. Post left nephrectomy, cholecystectomy, and hysterectomy. Electronically Signed   By: Jeb Levering M.D.   On: 11/12/2015 23:55   NUCLEAR MEDICINE PET SKULL BASE TO THIGH (12/01/2015)  TECHNIQUE: 7.3 mCi F-18 FDG was injected intravenously. Full-ring PET imaging was performed from the skull base to thigh after the radiotracer. CT data was obtained and used for attenuation correction and anatomic localization.  FASTING BLOOD GLUCOSE: Value: 89 mg/dl  COMPARISON: CT abdomen pelvis 11/12/2015.  FINDINGS: NECK  No hypermetabolic lymph nodes in the neck. CT images show no acute findings.  CHEST  A 5 mm short axis lymph node with fatty hilum in the left axilla has an SUV max of 2.5, nonspecific. No hypermetabolic mediastinal or hilar lymph nodes. No hypermetabolic pulmonary nodules. CT images show coronary artery calcification. Heart is at the upper limits of normal in size. No pericardial or pleural effusion.  ABDOMEN/PELVIS  Abnormal hypermetabolism along the posterior wall of the proximal stomach has an SUV max of 11.8. This corresponds to gastric wall thickening questioned on  11/12/2015. No abnormal hypermetabolism in the liver, adrenal glands, spleen or pancreas. Hypermetabolic lymph nodes are seen in the inguinal regions. Index lymph node on the right measures 10 mm (CT image 162) with an SUV max of 4.6. CT images show the liver to be grossly unremarkable. Cholecystectomy. Adrenal glands and right kidney are unremarkable. Left nephrectomy. Spleen, pancreas, stomach and bowel are otherwise grossly unremarkable.  SKELETON  No abnormal osseous hypermetabolism. Right hip arthroplasty. Degenerative changes are seen in the spine.  IMPRESSION: 1. Hypermetabolic proximal gastric lesion, consistent with the given history of adenocarcinoma. 2. Hypermetabolic inguinal lymph nodes are of uncertain etiology. A lymphoproliferative disorder cannot be excluded. 3. Coronary artery calcification.   Electronically Signed  By: Lorin Picket M.D.  On: 12/01/2015 14:18    ASSESSMENT & PLAN:   82 year old Caucasian female with multiple medical comorbidities including history of coronary artery disease, tachybradycardia syndrome status post permanent pacemaker, renal cancer status post left nephrectomy, uterine cancer status post hysterectomy and radiation therapy, paroxysmal atrial fibrillation   #1 newly diagnosed gastric adenocarcinoma. Imaging findings of EGD and biopsy are consistent with gastric adenocarcinoma.  CT chest abdomen pelvis shows no overt evidence of perigastric lymph node enlargement or obvious metastatic disease. PET/CT scan done on 12/01/2015 showed no overt evidence of metastatic disease. Noted to have right inguinal lymph node 10 mm which is somewhat FDG avid - nonspecific. Unclear significance.  #2 normocytic normochromic anemia - predominant factor appears to be acute blood loss anemia with iron deficiency, B12 deficiency, anemia of chronic kidney disease [patient has single kidney], anemia of chronic disease related to her gastric  adenocarcinoma.  #3 iron deficiency ferritin level is 65 will scheduled for additional IV iron  #4 vitamin B12 deficiency. B12 - 173 on 11/09/2015  #5 single kidney status post left nephrectomy for renal cell carcinoma  #6 hypothyroidism  #7 paroxysmal atrial fibrillation was controlled with admitted on and was on Coumadin prior to bleeding currently off Coumadin.  #8 coronary artery disease.  #9 history of uterine carcinoma -  remote history status post hysterectomy and RT.  #10 Jehovah's Witness - patient vehemently refuses any PRBC transfusions under all circumstances including life threatening situations.  #11 possible sepsis due to pneumonia/UTI -resolved.  Plan  -I discussed the PET CT scan results of the patient and her daughters and answered all her questions. -Patient has a follow-up with [name] early next week to discuss possibility of surgical options. -Will defer to [name] regarding need for endoscopic ultrasound. -Will give the patient additional IV iron to optimize her hemoglobin for surgery especially since she is a Jehovah's Witness. -Continue B12 injections at home as per recommendations -once a week for a month and then monthly. -We'll decide on need for further EPO -We shall see her back in a month repeat labs.   I spent 25 minutes counseling the patient face to face. The total time spent in the appointment was 25 minutes and more than 50% was on counseling and direct patient cares.    Sullivan Lone MD [place] AAHIVMS [facility] Hematology/Oncology Physician [facility]  (Office):       [phone] (Work cell):  [phone] (Fax):           [phone]

## 2015-12-08 NOTE — Progress Notes (Signed)
[name]    HEMATOLOGY/ONCOLOGY CONSULTATION NOTE  Date of Service: .11/24/2015  Patient Care Team: [name], MD as PCP - General (Internal Medicine)  CHIEF COMPLAINTS/PURPOSE OF CONSULTATION:    HISTORY OF PRESENTING ILLNESS:  [name] is a wonderful 82 y.o. female who is here for her posthospitalization follow-up for newly diagnosed gastric adenocarcinoma. She had been admitted to the hospital on 11/07/2015 with fatigue and progressive dyspnea on exertion. He was noted to have sepsis secondary to Streptococcus viridans bacteremia/pneumonia. She was also noted to have significant iron deficiency anemia with fecal occult blood testing positive. Subsequent EGD showed a malignant gastric tumor with biopsy noted to show gastric adenocarcinoma. Colonoscopy was noted to be normal. Patient has had a CT scan of the chest and abdomen showed a nonspecifically enlarged pretracheal lymph node, pleural effusions , overt metastatic disease or perigastric lymph nodes. Patient was noted to have a hemoglobin of 8.6 and was treated with IV Feraheme x 2 doses + B12 + EPO. Helicobacter pylori IgG was negative. She was also noted to have B12 deficiency which was treated. She absolutely refuses blood transfusions. She was seen by GI surgery who wanted her to recover from her anemia, infections and have a cardiac clearance prior to consideration of surgery. She was discharged with an outpatient follow-up with [name].  Patient notes that since her discharge she was noted to have a urinary tract infection and started on amoxicillin. She had a nuclear stress test on 11/21/2015 which showed ejection fraction of 62% with no ischemia or infarction.  Patient notes brown stools with no evidence of overt GI bleeding. She has been off her anticoagulation for atrial fibrillation as a result of her gastric mass and bleeding. On follow-up in clinic today her hemoglobin has improved from 8.6 now up to 12.1 with normal  platelets and WBC count.   MEDICAL HISTORY:  Past Medical History  Diagnosis Date  . PAF (paroxysmal atrial fibrillation) ([place])     controlled with amiodarone, on coumadin  . CAD (coronary artery disease)   . Aortic root dilatation ([place])   . Moderate aortic stenosis   . Chronic anticoagulation   . Tachycardia-bradycardia syndrome ([place])     s/p PPM by Dr Doreatha Lew (MDT) 07/03/10  . GERD (gastroesophageal reflux disease)   . Pacemaker     2012  . Neuropathy ([place])     feet/legs  . Restless leg syndrome   . Headache(784.0)     hx migraines - takes Zoloft for migraines  . History of uterine cancer 1980s    treated with hysterectomy, external radiation and radiation seed implants.   . Cancer ([place])     hx Kidney cancer / hx of endometrial cancer  . PONV (postoperative nausea and vomiting)   . Anxiety   . Heart murmur   . HTN (hypertension)   . Hyperlipemia   . Hypothyroidism   . Osteomyelitis ([place]) as child  . Family history of anesthesia complication     daughter has ponv  . Syncope 05/28/2014  . Hepatic steatosis   . Elevated LFTs   . Gastroparesis   . Anal fissure   . Radiation proctitis   . Gastric polyp     adenomatous  . Neuromuscular disorder ([facility])     neuropathy in right leg  . Chronic kidney disease     only has one kidney   . 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 Adenocarcinoma of stomach ([place]) 11/11/2015    SURGICAL HISTORY: Past  Surgical History  Procedure Laterality Date  . Cardiac catheterization  2008  . Nephrectomy Left     renal cell cancer  . Total abdominal hysterectomy    . Patent ductus arterious repair  age 30  . Ptvp    . Pacemaker insertion  07/04/11    by Dr Doreatha Lew  . Cholecystectomy  1970s  . Upper gastrointestinal endoscopy    . Total knee arthroplasty  02/07/2012    Procedure: TOTAL KNEE ARTHROPLASTY;  Surgeon: Mauri Pole, MD;  Location: [facility];  Service: Orthopedics;  Laterality: Right;  . Insert / replace / remove pacemaker    . Total hip  revision Right 2009  . I&d knee with poly exchange Right 12/17/2013    Procedure: IRRIGATION AND DEBRIDEMEN RIGHT TOTAL  KNEE WITH POLY EXCHANGE;  Surgeon: Mauri Pole, MD;  Location: [facility];  Service: Orthopedics;  Laterality: Right;  . Patellar tendon repair Right 03/06/2014    Procedure: AVULSION WITH PATELLA TENDON REPAIR;  Surgeon: Mauri Pole, MD;  Location: [facility];  Service: Orthopedics;  Laterality: Right;  . Total knee revision Right 07/29/2014    Procedure: RIGHT KNEE REVISION OF PREVIOUS REPAIR EXTENSOR MECHANISM;  Surgeon: Mauri Pole, MD;  Location: [facility];  Service: Orthopedics;  Laterality: Right;  . Colonoscopy N/A 11/11/2015    Procedure: COLONOSCOPY;  Surgeon: [name], MD;  Location: [facility];  Service: Endoscopy;  Laterality: N/A;  . Esophagogastroduodenoscopy N/A 11/11/2015    Procedure: ESOPHAGOGASTRODUODENOSCOPY (EGD);  Surgeon: [name], MD;  Location: [facility];  Service: Endoscopy;  Laterality: N/A;    SOCIAL HISTORY: Social History   Social History  . Marital Status: Married    Spouse Name: N/A  . Number of Children: N/A  . Years of Education: N/A   Occupational History  . Not on file.   Social History Main Topics  . Smoking status: Never Smoker   . Smokeless tobacco: Never Used  . Alcohol Use: No  . Drug Use: No  . Sexual Activity: No   Other Topics Concern  . Not on file   Social History Narrative   Lives in [place].  Continues to work.    FAMILY HISTORY: Family History  Problem Relation Age of Onset  . Colon cancer Mother 75  . Heart disease Mother   . Heart disease Father   . Heart attack Father   . Heart disease Maternal Grandmother     ALLERGIES:  is allergic to amiodarone; codeine; gabapentin; hydrocodone; morphine and related; requip; and zinc.  MEDICATIONS:  Current Outpatient Prescriptions  Medication Sig Dispense Refill  . atenolol (TENORMIN) 100 MG tablet Take 1 tablet (100 mg total) by mouth 2  (two) times daily. 60 tablet 11  . B Complex-C (B-COMPLEX WITH VITAMIN C) tablet Take 1 tablet by mouth daily. 30 tablet 0  . Biotin 5000 MCG CAPS Take 10,000 mcg by mouth every morning.     . Cholecalciferol (VITAMIN D PO) Take 5,000 Units by mouth every morning.     . Cyanocobalamin 1000 MCG/ML KIT Inject 1,000 mcg into the skin as directed. 6 kit 1  . furosemide (LASIX) 40 MG tablet Take 1 tablet (40 mg total) by mouth daily. 30 tablet 0  . guaiFENesin (MUCINEX) 600 MG 12 hr tablet Take 1,200 mg by mouth 2 (two) times daily.    [name] levothyroxine (SYNTHROID, LEVOTHROID) 100 MCG tablet TAKE 1 TABLET BY MOUTH EVERY DAY ON EMPTY STOMACH WITH A FULL  GLASS OF WATER (Patient taking differently: TAKE 1/2 TABLET BY MOUTH EVERY DAY ON EMPTY STOMACH WITH A FULL GLASS OF WATER) 90 tablet 1  . losartan (COZAAR) 50 MG tablet Take 1 tablet (50 mg total) by mouth daily. 30 tablet 11  . LYRICA 75 MG capsule TAKE ONE CAPSULE BY MOUTH TWICE A DAY 180 capsule 1  . nitroGLYCERIN (NITROSTAT) 0.4 MG SL tablet Place 1 tablet (0.4 mg total) under the tongue every 5 (five) minutes as needed for chest pain (MAX 3 TABLETS). 25 tablet prn  . pantoprazole (PROTONIX) 40 MG tablet Take 1 tablet (40 mg total) by mouth 2 (two) times daily. 60 tablet 0  . potassium chloride SA (K-DUR,KLOR-CON) 20 MEQ tablet Take 20 mEq by mouth 2 (two) times daily.    . sertraline (ZOLOFT) 50 MG tablet Take 1 tablet (50 mg total) by mouth daily. For mood 90 tablet 1  . sucralfate (CARAFATE) 1 g tablet Take 1 tablet (1 g total) by mouth 4 (four) times daily -  with meals and at bedtime. 120 tablet 0  . traMADol (ULTRAM) 50 MG tablet Take 50 mg by mouth every 6 (six) hours as needed (pain). Reported on 10/14/2015    . triamcinolone cream (KENALOG) 0.1 % APPLY TO AFFECTED AREA TWICE A DAY 45 g 2   No current facility-administered medications for this visit.    REVIEW OF SYSTEMS:    10 Point review of Systems was done is negative except as noted  above.  PHYSICAL EXAMINATION: ECOG PERFORMANCE STATUS: 2 - Symptomatic, <50% confined to bed  . Filed Vitals:   11/24/15 1314  BP: 123/75  Pulse: 91  Temp: 97.9 F (36.6 C)  Resp: 17   Filed Weights   11/24/15 1314  Weight: 144 lb (65.318 kg)   .Body mass index is 27.22 kg/(m^2).  GENERAL:Elderly lady, alert, in no acute distress and comfortable SKIN: skin color, texture, turgor are normal, no rashes or significant lesions EYES: normal, conjunctiva are pink and non-injected, sclera clear OROPHARYNX:no exudate, no erythema and lips, buccal mucosa, and tongue normal  NECK: supple, no JVD, thyroid normal size, non-tender, without nodularity LYMPH:  no palpable lymphadenopathy in the cervical, axillary or inguinal LUNGS: clear to auscultation with normal respiratory effort HEART: regular rate & rhythm,  no murmurs and no lower extremity edema ABDOMEN: abdomen soft, non-tender, normoactive bowel sounds  Musculoskeletal: no cyanosis of digits and no clubbing  PSYCH: alert & oriented x 3 with fluent speech NEURO: no focal motor/sensory deficits  LABORATORY DATA:  I have reviewed the data as listed  . CBC Latest Ref Rng 12/08/2015 11/24/2015 11/18/2015  WBC 3.9 - 10.3 10e3/uL 6.0 8.5 11.0(H)  Hemoglobin 11.6 - 15.9 g/dL 12.2 12.1 10.7(L)  Hematocrit 34.8 - 46.6 % 39.7 40.4 33.5(L)  Platelets 145 - 400 10e3/uL 235 346 404(H)    . CMP Latest Ref Rng 11/24/2015 11/18/2015 11/15/2015  Glucose 70 - 140 mg/dl 85 98 93  BUN 7.0 - 26.0 mg/dL 20._0 Creatinine 0.6 - 1.1 mg/dL 1.3(H) 0.92 1.08(H)  Sodium 136 - 145 mEq/L 143 141 144  Potassium 3.5 - 5.1 mEq/L 4.7 3.9 3.9  Chloride 98 - 110 mmol/L - 104 112(H)  CO2 22 - 29 mEq/L _1 Calcium 8.4 - 10.4 mg/dL 9.4 9.1 8.8(L)  Total Protein 6.4 - 8.3 g/dL 7.4 - -  Total Bilirubin 0.20 - 1.20 mg/dL 0.44 - -  Alkaline Phos 40 - 150 U/L 100 - -  AST 5 - 34 U/L 15 - -  ALT 0 - 55 U/L 10 - -      RADIOGRAPHIC STUDIES: I have  personally reviewed the radiological images as listed and agreed with the findings in the report. Dg Chest 2 View  11/14/2015  CLINICAL DATA:  Shortness of breath and pleural effusions EXAM: CHEST  2 VIEW COMPARISON:  11/12/2015 FINDINGS: Cardiac shadow remains enlarged. A pacing device is again seen. Bilateral pleural effusions left greater than right are noted. No focal infiltrate is seen. No bony abnormality is noted. IMPRESSION: Stable bilateral pleural effusions. Electronically Signed   By: Inez Catalina M.D.   On: 11/14/2015 14:20   Dg Knee 1-2 Views Right  11/08/2015  CLINICAL DATA:  Pain for 2 days. No recent trauma. Total knee replacement 3 years ago. EXAM: RIGHT KNEE - 1-2 VIEW COMPARISON:  None. FINDINGS: The patient is status post knee replacement. Hardware is in good position with no loosening or failure identified. Surgical screws and metallic tacks project over the proximal tibia, also in good position. No joint effusion. Mild anterior soft tissue swelling is seen. No bony erosion or bony lesions. No fractures are seen. IMPRESSION: Mild anterior soft tissue swelling.  No other abnormalities. Electronically Signed   By: Dorise Bullion III M.D   On: 11/08/2015 16:00   Ct Chest W Contrast  11/12/2015  CLINICAL DATA:  Progressively worsening dyspnea. Subjective fever, chills and cough. Malaise and decreased oral intake. Gastric mass on endoscopy. EXAM: CT CHEST, ABDOMEN, AND PELVIS WITH CONTRAST TECHNIQUE: Multidetector CT imaging of the chest, abdomen and pelvis was performed following the standard protocol during bolus administration of intravenous contrast. CONTRAST:  48m OMNIPAQUE IOHEXOL 300 MG/ML SOLN, 1023mOMNIPAQUE IOHEXOL 300 MG/ML SOLN COMPARISON:  Chest CT five days prior 11/07/2015. Most recent CT abdomen/ pelvis 05/31/2014 FINDINGS: CT CHEST FINDINGS Mediastinum/Lymph Nodes: Enlarged precarinal lymph node measures 1.5 cm, previously 1.1 cm. Additional small mediastinal and hilar lymph  nodes are not enlarged by size criteria. Aneurysmal dilatation of the ascending aorta, 3.8 cm currently, differences likely secondary to caliper placement. No aortic dissection. Coronary artery calcifications are seen. Pacemaker in place. Mitral annulus calcifications. No pericardial effusion. Lungs/Pleura: Progression in bilateral pleural effusions, now moderate in degree. There is adjacent compressive atelectasis in both lower lobes. Calcified granuloma in the right lung is unchanged. Musculoskeletal: There are no acute or suspicious osseous abnormalities. Multilevel degenerative change in the thoracic spine. CT ABDOMEN PELVIS FINDINGS Hepatobiliary: No focal hepatic lesion. Postcholecystectomy with unchanged prominence of the common bile duct measuring 11 mm. No calcified choledocholithiasis. Pancreas: No ductal dilatation or inflammation. Spleen: Normal in size and density, no focal abnormality. Adrenals/Urinary Tract: There is no adrenal nodule. Patient is post left nephrectomy. Homogeneous attenuation throughout the right kidney without hydronephrosis. Urinary bladder is minimally distended. Stomach/Bowel: Irregular wall thickening involving the gastric fundus. More distal stomach is decompressed. No definite perigastric lymphadenopathy. There are no dilated or thickened bowel loops. Contrast and stool throughout the colon without abnormal distension. No bowel obstruction, oral contrast reaches the rectum. Appendix is not visualized. Vascular/Lymphatic: Dense atherosclerosis of the abdominal aorta without aneurysm. Small retroperitoneal lymph nodes, not enlarged by size criteria. Reproductive: Uterus is surgically absent. No gross adnexal mass, detailed pelvic evaluation partially obscured by streak artifact from right hip prosthesis. Other: Whole body wall edema. There is mild presacral edema, chronic. Post bilateral pelvic lymph node dissection. No free intra-abdominal air or free fluid. Musculoskeletal:  Right hip arthroplasty. There are  no acute or suspicious osseous abnormalities. Multilevel degenerative change in the lumbar spine. IMPRESSION: 1. Progressive bilateral pleural effusions since exam 5 days prior, now moderate in degree. There is adjacent compressive atelectasis in the lower lobes. 2. Minimal increased size in the prominent pretracheal lymph node, nonspecific, increased size likely reactive. 3. Irregular gastric wall thickening involving the fundus, concerning for malignancy. No definite perigastric adenopathy. No evidence of metastatic disease in the abdomen and pelvis. 4. Post left nephrectomy, cholecystectomy, and hysterectomy. Electronically Signed   By: Jeb Levering M.D.   On: 11/12/2015 23:55   Ct Abdomen Pelvis W Contrast  11/12/2015  CLINICAL DATA:  Progressively worsening dyspnea. Subjective fever, chills and cough. Malaise and decreased oral intake. Gastric mass on endoscopy. EXAM: CT CHEST, ABDOMEN, AND PELVIS WITH CONTRAST TECHNIQUE: Multidetector CT imaging of the chest, abdomen and pelvis was performed following the standard protocol during bolus administration of intravenous contrast. CONTRAST:  44m OMNIPAQUE IOHEXOL 300 MG/ML SOLN, 1011mOMNIPAQUE IOHEXOL 300 MG/ML SOLN COMPARISON:  Chest CT five days prior 11/07/2015. Most recent CT abdomen/ pelvis 05/31/2014 FINDINGS: CT CHEST FINDINGS Mediastinum/Lymph Nodes: Enlarged precarinal lymph node measures 1.5 cm, previously 1.1 cm. Additional small mediastinal and hilar lymph nodes are not enlarged by size criteria. Aneurysmal dilatation of the ascending aorta, 3.8 cm currently, differences likely secondary to caliper placement. No aortic dissection. Coronary artery calcifications are seen. Pacemaker in place. Mitral annulus calcifications. No pericardial effusion. Lungs/Pleura: Progression in bilateral pleural effusions, now moderate in degree. There is adjacent compressive atelectasis in both lower lobes. Calcified granuloma in  the right lung is unchanged. Musculoskeletal: There are no acute or suspicious osseous abnormalities. Multilevel degenerative change in the thoracic spine. CT ABDOMEN PELVIS FINDINGS Hepatobiliary: No focal hepatic lesion. Postcholecystectomy with unchanged prominence of the common bile duct measuring 11 mm. No calcified choledocholithiasis. Pancreas: No ductal dilatation or inflammation. Spleen: Normal in size and density, no focal abnormality. Adrenals/Urinary Tract: There is no adrenal nodule. Patient is post left nephrectomy. Homogeneous attenuation throughout the right kidney without hydronephrosis. Urinary bladder is minimally distended. Stomach/Bowel: Irregular wall thickening involving the gastric fundus. More distal stomach is decompressed. No definite perigastric lymphadenopathy. There are no dilated or thickened bowel loops. Contrast and stool throughout the colon without abnormal distension. No bowel obstruction, oral contrast reaches the rectum. Appendix is not visualized. Vascular/Lymphatic: Dense atherosclerosis of the abdominal aorta without aneurysm. Small retroperitoneal lymph nodes, not enlarged by size criteria. Reproductive: Uterus is surgically absent. No gross adnexal mass, detailed pelvic evaluation partially obscured by streak artifact from right hip prosthesis. Other: Whole body wall edema. There is mild presacral edema, chronic. Post bilateral pelvic lymph node dissection. No free intra-abdominal air or free fluid. Musculoskeletal: Right hip arthroplasty. There are no acute or suspicious osseous abnormalities. Multilevel degenerative change in the lumbar spine. IMPRESSION: 1. Progressive bilateral pleural effusions since exam 5 days prior, now moderate in degree. There is adjacent compressive atelectasis in the lower lobes. 2. Minimal increased size in the prominent pretracheal lymph node, nonspecific, increased size likely reactive. 3. Irregular gastric wall thickening involving the  fundus, concerning for malignancy. No definite perigastric adenopathy. No evidence of metastatic disease in the abdomen and pelvis. 4. Post left nephrectomy, cholecystectomy, and hysterectomy. Electronically Signed   By: MeJeb Levering.D.   On: 11/12/2015 23:55    ASSESSMENT & PLAN:   [id number] old Caucasian female with multiple medical comorbidities including history of coronary artery disease, tachybradycardia syndrome status post permanent pacemaker, renal cancer status post  left nephrectomy, uterine cancer status post hysterectomy and radiation therapy, paroxysmal atrial fibrillation   #1 newly diagnosed gastric adenocarcinoma. Imaging findings of EGD and biopsy are consistent with gastric adenocarcinoma.  CT chest abdomen pelvis shows no overt evidence of perigastric lymph node enlargement or obvious metastatic disease.  #2 normocytic normochromic anemia - predominant factor appears to be acute blood loss anemia with iron deficiency, B12 deficiency, anemia of chronic kidney disease [patient has single kidney], anemia of chronic disease related to her gastric adenocarcinoma.  #3 iron deficiency  #4 vitamin B12 deficiency. B12 - 173 on 11/09/2015  #5 single kidney status post left nephrectomy for renal cell carcinoma  #6 hypothyroidism  #7 paroxysmal atrial fibrillation was controlled with admitted on and was on Coumadin prior to bleeding currently off Coumadin.  #8 coronary artery disease.  #9 history of uterine carcinoma - remote history status post hysterectomy and RT.  #10 Jehovah's Witness - patient vehemently refuses any PRBC transfusions under all circumstances including life threatening situations.  #11 possible sepsis due to pneumonia/UTI Plan  -Patient is here for her posthospitalization follow-up. She notes that she is feeling much better. Hemoglobin has improved from 8.6 now up to 12.2 and she feels much better. -She had a cardiac stress test which showed no evidence of  ischemia with an ejection fraction of 62%. -PET/CT for completion of her staging. -Follow-up with [name] for determination of surgical option and need for preoperative EUS for local staging. -We discussed the brought natural history, diagnosis and treatment approach for gastric adenocarcinoma -Return to care with Dr. Irene Limbo 2 weeks with CBC, CMP, ferritin to determine need for further IV iron and further treatment plan.   I spent 40 minutes counseling the patient face to face. The total time spent in the appointment was 40 minutes and more than 50% was on counseling and direct patient cares.    Sullivan Lone MD [place] AAHIVMS [facility] Hematology/Oncology Physician [facility]  (Office):       [phone] (Work cell):  [phone] (Fax):           [phone]

## 2015-12-09 DIAGNOSIS — A4189 Other specified sepsis: Secondary | ICD-10-CM | POA: Diagnosis not present

## 2015-12-09 DIAGNOSIS — Z85528 Personal history of other malignant neoplasm of kidney: Secondary | ICD-10-CM | POA: Diagnosis not present

## 2015-12-09 DIAGNOSIS — I480 Paroxysmal atrial fibrillation: Secondary | ICD-10-CM | POA: Diagnosis not present

## 2015-12-09 DIAGNOSIS — Z7901 Long term (current) use of anticoagulants: Secondary | ICD-10-CM | POA: Diagnosis not present

## 2015-12-09 DIAGNOSIS — J189 Pneumonia, unspecified organism: Secondary | ICD-10-CM | POA: Diagnosis not present

## 2015-12-09 DIAGNOSIS — I2510 Atherosclerotic heart disease of native coronary artery without angina pectoris: Secondary | ICD-10-CM | POA: Diagnosis not present

## 2015-12-09 DIAGNOSIS — N183 Chronic kidney disease, stage 3 (moderate): Secondary | ICD-10-CM | POA: Diagnosis not present

## 2015-12-09 DIAGNOSIS — Z950 Presence of cardiac pacemaker: Secondary | ICD-10-CM | POA: Diagnosis not present

## 2015-12-09 DIAGNOSIS — I129 Hypertensive chronic kidney disease with stage 1 through stage 4 chronic kidney disease, or unspecified chronic kidney disease: Secondary | ICD-10-CM | POA: Diagnosis not present

## 2015-12-09 DIAGNOSIS — K319 Disease of stomach and duodenum, unspecified: Secondary | ICD-10-CM | POA: Diagnosis not present

## 2015-12-11 ENCOUNTER — Other Ambulatory Visit: Payer: Self-pay | Admitting: *Deleted

## 2015-12-11 ENCOUNTER — Ambulatory Visit: Payer: Self-pay

## 2015-12-11 ENCOUNTER — Other Ambulatory Visit: Payer: Self-pay | Admitting: Internal Medicine

## 2015-12-11 DIAGNOSIS — I480 Paroxysmal atrial fibrillation: Secondary | ICD-10-CM | POA: Diagnosis not present

## 2015-12-11 DIAGNOSIS — E538 Deficiency of other specified B group vitamins: Secondary | ICD-10-CM

## 2015-12-11 DIAGNOSIS — Z7901 Long term (current) use of anticoagulants: Secondary | ICD-10-CM | POA: Diagnosis not present

## 2015-12-11 DIAGNOSIS — A4189 Other specified sepsis: Secondary | ICD-10-CM | POA: Diagnosis not present

## 2015-12-11 DIAGNOSIS — J189 Pneumonia, unspecified organism: Secondary | ICD-10-CM | POA: Diagnosis not present

## 2015-12-11 DIAGNOSIS — N183 Chronic kidney disease, stage 3 (moderate): Secondary | ICD-10-CM | POA: Diagnosis not present

## 2015-12-11 DIAGNOSIS — Z85528 Personal history of other malignant neoplasm of kidney: Secondary | ICD-10-CM | POA: Diagnosis not present

## 2015-12-11 DIAGNOSIS — Z950 Presence of cardiac pacemaker: Secondary | ICD-10-CM | POA: Diagnosis not present

## 2015-12-11 DIAGNOSIS — I129 Hypertensive chronic kidney disease with stage 1 through stage 4 chronic kidney disease, or unspecified chronic kidney disease: Secondary | ICD-10-CM | POA: Diagnosis not present

## 2015-12-11 DIAGNOSIS — I2510 Atherosclerotic heart disease of native coronary artery without angina pectoris: Secondary | ICD-10-CM | POA: Diagnosis not present

## 2015-12-11 DIAGNOSIS — K319 Disease of stomach and duodenum, unspecified: Secondary | ICD-10-CM | POA: Diagnosis not present

## 2015-12-11 MED ORDER — B COMPLEX-C PO TABS: 1.0000 | ORAL_TABLET | Freq: Every day | ORAL | Status: DC

## 2015-12-11 MED ORDER — SUCRALFATE 1 G PO TABS: ORAL_TABLET | ORAL | Status: DC

## 2015-12-11 MED ORDER — PANTOPRAZOLE SODIUM 40 MG PO TBEC: DELAYED_RELEASE_TABLET | ORAL | Status: DC

## 2015-12-15 ENCOUNTER — Other Ambulatory Visit: Payer: Self-pay | Admitting: General Surgery

## 2015-12-15 DIAGNOSIS — Z531 Procedure and treatment not carried out because of patient's decision for reasons of belief and group pressure: Secondary | ICD-10-CM | POA: Diagnosis not present

## 2015-12-15 DIAGNOSIS — I482 Chronic atrial fibrillation: Secondary | ICD-10-CM | POA: Diagnosis not present

## 2015-12-15 DIAGNOSIS — C169 Malignant neoplasm of stomach, unspecified: Secondary | ICD-10-CM | POA: Diagnosis not present

## 2015-12-15 DIAGNOSIS — D500 Iron deficiency anemia secondary to blood loss (chronic): Secondary | ICD-10-CM | POA: Diagnosis not present

## 2015-12-18 ENCOUNTER — Telehealth: Payer: Self-pay | Admitting: Genetic Counselor

## 2015-12-18 ENCOUNTER — Encounter: Payer: Self-pay | Admitting: Genetic Counselor

## 2015-12-18 NOTE — Telephone Encounter (Signed)
Verified address and insurance, lt mess with referring provider appt date/time, mailed new pt packet, scheduled intake

## 2015-12-23 ENCOUNTER — Other Ambulatory Visit: Payer: Self-pay | Admitting: *Deleted

## 2015-12-24 ENCOUNTER — Telehealth: Payer: Self-pay | Admitting: Hematology

## 2015-12-24 ENCOUNTER — Telehealth: Payer: Self-pay | Admitting: Genetic Counselor

## 2015-12-24 NOTE — Telephone Encounter (Signed)
Pt called to reschedule due to surgery will be day before and will not be able to make appt. Changed to 6/1

## 2015-12-24 NOTE — Telephone Encounter (Signed)
s.w. pt dtr and adivsed on 4.21 and 4.28 appt....ok and aware

## 2015-12-26 ENCOUNTER — Ambulatory Visit (HOSPITAL_BASED_OUTPATIENT_CLINIC_OR_DEPARTMENT_OTHER): Payer: Medicare Other

## 2015-12-26 VITALS — BP 127/56 | HR 74 | Temp 98.2°F | Resp 16

## 2015-12-26 DIAGNOSIS — D500 Iron deficiency anemia secondary to blood loss (chronic): Secondary | ICD-10-CM | POA: Diagnosis not present

## 2015-12-26 MED ORDER — SODIUM CHLORIDE 0.9 % IV SOLN
Freq: Once | INTRAVENOUS | Status: AC
  Administered 2015-12-26: 12:00:00 via INTRAVENOUS

## 2015-12-26 MED ORDER — FERUMOXYTOL INJECTION 510 MG/17 ML
510.0000 mg | Freq: Once | INTRAVENOUS | Status: AC
  Administered 2015-12-26: 510 mg via INTRAVENOUS
  Filled 2015-12-26: qty 17

## 2015-12-26 NOTE — Patient Instructions (Signed)

## 2015-12-27 ENCOUNTER — Encounter: Payer: Self-pay | Admitting: *Deleted

## 2015-12-30 ENCOUNTER — Telehealth: Payer: Self-pay | Admitting: Cardiology

## 2015-12-30 NOTE — Telephone Encounter (Signed)
Received records from [facility] for appointment on 01/06/16 with Dr [name].  Records given to [facility] (medical records) for Dr Doug Sou schedule on 01/06/16. lp

## 2015-12-31 ENCOUNTER — Telehealth: Payer: Self-pay | Admitting: Hematology

## 2015-12-31 ENCOUNTER — Other Ambulatory Visit: Payer: Self-pay | Admitting: *Deleted

## 2015-12-31 DIAGNOSIS — D509 Iron deficiency anemia, unspecified: Secondary | ICD-10-CM

## 2015-12-31 NOTE — Progress Notes (Signed)
Daughter of pt called to inquire about EPO injection previously discussed by Dr. Irene Limbo.  Pt scheduled for iron infusion this Friday, and they would like to have injection given at the same time if necessary.  Per Dr. Irene Limbo, EPO injection to be given if Hgb less than 12.   POF for lab apt with CBC placed prior to infusion on Friday.  Daughter aware.

## 2015-12-31 NOTE — Telephone Encounter (Signed)
s.w. pt dtr anfd advised on added labs b4 4.28 appt....ok and aware

## 2016-01-01 ENCOUNTER — Other Ambulatory Visit: Payer: Self-pay | Admitting: Internal Medicine

## 2016-01-02 ENCOUNTER — Other Ambulatory Visit (HOSPITAL_BASED_OUTPATIENT_CLINIC_OR_DEPARTMENT_OTHER): Payer: Medicare Other

## 2016-01-02 ENCOUNTER — Ambulatory Visit (HOSPITAL_BASED_OUTPATIENT_CLINIC_OR_DEPARTMENT_OTHER): Payer: Medicare Other

## 2016-01-02 VITALS — BP 130/83 | HR 74 | Temp 97.9°F | Resp 18

## 2016-01-02 DIAGNOSIS — D509 Iron deficiency anemia, unspecified: Secondary | ICD-10-CM

## 2016-01-02 DIAGNOSIS — D500 Iron deficiency anemia secondary to blood loss (chronic): Secondary | ICD-10-CM

## 2016-01-02 LAB — CBC WITH DIFFERENTIAL/PLATELET
BASO%: 1.2 % (ref 0.0–2.0)
BASOS ABS: 0.1 10*3/uL (ref 0.0–0.1)
EOS ABS: 0.3 10*3/uL (ref 0.0–0.5)
EOS%: 3.8 % (ref 0.0–7.0)
HCT: 37.8 % (ref 34.8–46.6)
HEMOGLOBIN: 12.1 g/dL (ref 11.6–15.9)
LYMPH%: 22.2 % (ref 14.0–49.7)
MCH: 27.6 pg (ref 25.1–34.0)
MCHC: 31.9 g/dL (ref 31.5–36.0)
MCV: 86.6 fL (ref 79.5–101.0)
MONO#: 0.5 10*3/uL (ref 0.1–0.9)
MONO%: 7.8 % (ref 0.0–14.0)
NEUT%: 65 % (ref 38.4–76.8)
NEUTROS ABS: 4.4 10*3/uL (ref 1.5–6.5)
PLATELETS: 200 10*3/uL (ref 145–400)
RBC: 4.37 10*6/uL (ref 3.70–5.45)
RDW: 18.9 % — AB (ref 11.2–14.5)
WBC: 6.7 10*3/uL (ref 3.9–10.3)
lymph#: 1.5 10*3/uL (ref 0.9–3.3)

## 2016-01-02 MED ORDER — FERUMOXYTOL INJECTION 510 MG/17 ML
510.0000 mg | Freq: Once | INTRAVENOUS | Status: AC
  Administered 2016-01-02: 510 mg via INTRAVENOUS
  Filled 2016-01-02: qty 17

## 2016-01-02 MED ORDER — SODIUM CHLORIDE 0.9 % IV SOLN
Freq: Once | INTRAVENOUS | Status: AC
  Administered 2016-01-02: 12:00:00 via INTRAVENOUS

## 2016-01-02 NOTE — Patient Instructions (Signed)

## 2016-01-06 ENCOUNTER — Encounter: Payer: Self-pay | Admitting: Cardiology

## 2016-01-06 ENCOUNTER — Ambulatory Visit (INDEPENDENT_AMBULATORY_CARE_PROVIDER_SITE_OTHER): Payer: Medicare Other | Admitting: Cardiology

## 2016-01-06 VITALS — BP 133/82 | HR 80 | Ht 61.0 in | Wt 144.8 lb

## 2016-01-06 DIAGNOSIS — I480 Paroxysmal atrial fibrillation: Secondary | ICD-10-CM | POA: Diagnosis not present

## 2016-01-06 DIAGNOSIS — C169 Malignant neoplasm of stomach, unspecified: Secondary | ICD-10-CM | POA: Diagnosis not present

## 2016-01-06 DIAGNOSIS — Z8774 Personal history of (corrected) congenital malformations of heart and circulatory system: Secondary | ICD-10-CM

## 2016-01-06 DIAGNOSIS — Z9889 Other specified postprocedural states: Secondary | ICD-10-CM | POA: Diagnosis not present

## 2016-01-06 DIAGNOSIS — I350 Nonrheumatic aortic (valve) stenosis: Secondary | ICD-10-CM

## 2016-01-06 DIAGNOSIS — Z950 Presence of cardiac pacemaker: Secondary | ICD-10-CM | POA: Diagnosis not present

## 2016-01-06 NOTE — Patient Instructions (Signed)
Continue your current therapy  You are cleared to proceed with surgery  We will arrange follow up after your surgery

## 2016-01-06 NOTE — Progress Notes (Signed)
Cardiology Office Note   Date:  01/06/2016   ID:  [name], DOB [birth year], MRN [id number]  PCP:  Valerie Richards, MD  Cardiologist: Valerie Merfeld [name] MD  Chief Complaint  Patient presents with  . Follow-up    3 months  former Dr. Mare Ferrari pt--PAF  pt states no Sx.      History of Present Illness: [name] is a 82 y.o. female who presents for follow-up Afib  She has a past history of tachybradycardia syndrome . She has a permanent pacemaker implanted 07/01/10. She has a remote history of repair of a patent ductus arteriosus with repair. She has a history of moderate valvular aortic stenosis. She has had a history of congestive heart failure. She was admitted 08/25/12-08/29/12 for exacerbation of CHF. She was hospitalized again in November 2015 for congestive heart failure and pneumonia. Her last echocardiogram 08/26/12 showed ejection fraction of 60-65% with grade 2 diastolic dysfunction and a peak aortic valve gradient of 29 with a mean aortic valve gradient of 14. Her more recent echocardiogram in March 2017 showed ejection fraction of 60-65%. The aortic valve showed mild- moderate stenosis and moderate regurgitation.  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 She had carotid Dopplers which did not show any significant obstruction.  This spring she developed symptoms of dyspnea and fatigue. She was found to be anemic. Anemia improved with iron and Epogen. 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 GI evaluation revealed a gastric mass with bx c/w adenoCA. She is off Coumadin now. Plan surgery for gastric tumor next week with Dr Barry Dienes.   She does experience some symptoms with her Afib. Mostly palpitations. Pacer interrogation reveals Afib 39% of the time. Recent Myoview study was normal.   Past Medical History  Diagnosis Date  . PAF (paroxysmal atrial fibrillation)  ([place])     controlled with amiodarone, on coumadin  . CAD (coronary artery disease)   . Aortic root dilatation ([place])   . Moderate aortic stenosis   . Chronic anticoagulation   . Tachycardia-bradycardia syndrome ([place])     s/p PPM by Dr Doreatha Lew (MDT) 07/03/10  . GERD (gastroesophageal reflux disease)   . Pacemaker     2012  . Neuropathy ([place])     feet/legs  . Restless leg syndrome   . Headache(784.0)     hx migraines - takes Zoloft for migraines  . History of uterine cancer 1980s    treated with hysterectomy, external radiation and radiation seed implants.   . Cancer ([place])     hx Kidney cancer / hx of endometrial cancer  . PONV (postoperative nausea and vomiting)   . Anxiety   . Heart murmur   . HTN (hypertension)   . Hyperlipemia   . Hypothyroidism   . Osteomyelitis ([place]) as child  . Family history of anesthesia complication     daughter has ponv  . Syncope 05/28/2014  . Hepatic steatosis   . Elevated LFTs   . Gastroparesis   . Anal fissure   . Radiation proctitis   . Gastric polyp     adenomatous  . Neuromuscular disorder ([facility])     neuropathy in right leg  . Chronic kidney disease     only has one kidney   . 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 Adenocarcinoma of stomach ([place]) 11/11/2015    Past Surgical History  Procedure Laterality Date  .  Cardiac catheterization  2008  . Nephrectomy Left     renal cell cancer  . Total abdominal hysterectomy    . Patent ductus arterious repair  age 47  . Ptvp    . Pacemaker insertion  07/04/11    by Dr Doreatha Lew  . Cholecystectomy  1970s  . Upper gastrointestinal endoscopy    . Total knee arthroplasty  02/07/2012    Procedure: TOTAL KNEE ARTHROPLASTY;  Surgeon: Mauri Pole, MD;  Location: [facility];  Service: Orthopedics;  Laterality: Right;  . Insert / replace / remove pacemaker    . Total hip revision Right 2009  . I&d knee with poly exchange Right 12/17/2013    Procedure: IRRIGATION AND DEBRIDEMEN RIGHT TOTAL  KNEE WITH POLY EXCHANGE;   Surgeon: Mauri Pole, MD;  Location: [facility];  Service: Orthopedics;  Laterality: Right;  . Patellar tendon repair Right 03/06/2014    Procedure: AVULSION WITH PATELLA TENDON REPAIR;  Surgeon: Mauri Pole, MD;  Location: [facility];  Service: Orthopedics;  Laterality: Right;  . Total knee revision Right 07/29/2014    Procedure: RIGHT KNEE REVISION OF PREVIOUS REPAIR EXTENSOR MECHANISM;  Surgeon: Mauri Pole, MD;  Location: [facility];  Service: Orthopedics;  Laterality: Right;  . Colonoscopy N/A 11/11/2015    Procedure: COLONOSCOPY;  Surgeon: [name], MD;  Location: [facility];  Service: Endoscopy;  Laterality: N/A;  . Esophagogastroduodenoscopy N/A 11/11/2015    Procedure: ESOPHAGOGASTRODUODENOSCOPY (EGD);  Surgeon: [name], MD;  Location: [facility];  Service: Endoscopy;  Laterality: N/A;     Current Outpatient Prescriptions  Medication Sig Dispense Refill  . ALPRAZolam (XANAX) 0.5 MG tablet TAKE 1/2-1 TABLET BY MOUTH 3 TIMES A DAY AS NEEDED FOR ANXIETY 90 tablet 0  . atenolol (TENORMIN) 100 MG tablet Take 1 tablet (100 mg total) by mouth 2 (two) times daily. 60 tablet 11  . B Complex-C (B-COMPLEX WITH VITAMIN C) tablet Take 1 tablet by mouth daily. 30 tablet 3  . Biotin 5000 MCG CAPS Take 10,000 mcg by mouth every morning.     . cetirizine (ZYRTEC) 10 MG tablet Take 10 mg by mouth at bedtime.    . Cholecalciferol (VITAMIN D PO) Take 5,000 Units by mouth every morning.     . Cyanocobalamin 1000 MCG/ML KIT Inject 1,000 mcg into the skin as directed. (Patient taking differently: Inject 1,000 mcg into the skin every 30 (thirty) days. ) 6 kit 1  . furosemide (LASIX) 40 MG tablet TAKE 1 TABLET (40 MG TOTAL) BY MOUTH DAILY. (Patient taking differently: TAKE 1/2 TABLET (20 MG TOTAL) BY MOUTH DAILY.) 30 tablet 0  . guaiFENesin (MUCINEX) 600 MG 12 hr tablet Take 1,200 mg by mouth 2 (two) times daily as needed for cough or to loosen phlegm.     [name] levothyroxine (SYNTHROID, LEVOTHROID) 100  MCG tablet TAKE 1 TABLET BY MOUTH EVERY DAY ON EMPTY STOMACH WITH A FULL GLASS OF WATER (Patient taking differently: TAKE 1/2 TABLET (50 MCG TOTAL) BY MOUTH EVERY DAY ON EMPTY STOMACH WITH A FULL GLASS OF WATER) 90 tablet 1  . losartan (COZAAR) 50 MG tablet Take 1 tablet (50 mg total) by mouth daily. 30 tablet 11  . LYRICA 75 MG capsule TAKE ONE CAPSULE BY MOUTH TWICE A DAY 180 capsule 1  . nitroGLYCERIN (NITROSTAT) 0.4 MG SL tablet Place 1 tablet (0.4 mg total) under the tongue every 5 (five) minutes as needed for chest pain (MAX 3 TABLETS). 25 tablet prn  .  pantoprazole (PROTONIX) 40 MG tablet TAKE 1 TABLET (40 MG TOTAL) BY MOUTH 2 (TWO) TIMES DAILY. 60 tablet 3  . potassium chloride SA (K-DUR,KLOR-CON) 20 MEQ tablet Take 20 mEq by mouth 2 (two) times daily.    . sertraline (ZOLOFT) 50 MG tablet Take 1 tablet (50 mg total) by mouth daily. For mood 90 tablet 1  . sucralfate (CARAFATE) 1 g tablet TAKE 1 TABLET (1 G TOTAL) BY MOUTH 4 (FOUR) TIMES DAILY - WITH MEALS AND AT BEDTIME. 120 tablet 3  . traMADol (ULTRAM) 50 MG tablet Take 50 mg by mouth every 6 (six) hours as needed (pain). Reported on 10/14/2015    . triamcinolone cream (KENALOG) 0.1 % APPLY TO AFFECTED AREA TWICE A DAY 45 g 2   No current facility-administered medications for this visit.    Allergies:   Amiodarone; Codeine; Gabapentin; Hydrocodone; Morphine and related; Requip; and Zinc    Social History:  The patient  reports that she has never smoked. She has never used smokeless tobacco. She reports that she does not drink alcohol or use illicit drugs.   Family History:  The patient's family history includes Colon cancer (age of onset: 95) in her mother; Heart attack in her father; Heart disease in her father, maternal grandmother, and mother.    ROS:  Please see the history of present illness.   Otherwise, review of systems are positive for none.   All other systems are reviewed and negative.    PHYSICAL EXAM: VS:  BP 133/82  mmHg  Pulse 80  Ht '5\' 1"'$  (1.549 m)  Wt 65.681 kg (144 lb 12.8 oz)  BMI 27.37 kg/m2  LMP  (Exact Date) , BMI Body mass index is 27.37 kg/(m^2). GEN: Well nourished, well developed, in no acute distress HEENT: normal Neck: no JVD, carotid bruits, or masses Cardiac:  Irregularly irregular rhythm. There is a grade 2/6 systolic ejection murmur at the aortic area. No diastolic murmur heard. There is no rubs, or gallops,no edema  Respiratory:  clear to auscultation bilaterally, normal work of breathing GI: soft, nontender, nondistended, + BS MS: no deformity or atrophy Skin: warm and dry, no rash Neuro:  Strength and sensation are intact Psych: euthymic mood, full affect   EKG:  EKG is not ordered today. The ekg ordered today N/A  Recent Labs: 11/07/2015: Magnesium 1.5*; TSH 0.851 12/08/2015: ALT 10; BUN 25.3; Creatinine 1.1; Potassium 4.2; Sodium 143 01/02/2016: HGB 12.1; Platelets 200    Lipid Panel    Component Value Date/Time   CHOL 183 10/09/2015 1156   TRIG 244* 10/09/2015 1156   HDL 29* 10/09/2015 1156   CHOLHDL 6.3* 10/09/2015 1156   VLDL 49* 10/09/2015 1156   LDLCALC 105 10/09/2015 1156      Wt Readings from Last 3 Encounters:  01/06/16 65.681 kg (144 lb 12.8 oz)  12/08/15 66.86 kg (147 lb 6.4 oz)  11/26/15 67.314 kg (148 lb 6.4 oz)     Echo 11/14/15:  Study Conclusions  - Left ventricle: The cavity size was normal. There was mild  concentric hypertrophy. Systolic function was normal. The  estimated ejection fraction was in the range of 60% to 65%. Wall  motion was normal; there were no regional wall motion  abnormalities. The study was not technically sufficient to allow  evaluation of LV diastolic dysfunction due to atrial  fibrillation. - Aortic valve: Valve mobility was restricted. There was mild to  moderate stenosis. There was moderate regurgitation. Peak  gradient (S): 38 mm  Hg. Valve area (VTI): 0.55 cm^2. Valve area  (Vmax): 0.57 cm^2. Valve  area (Vmean): 0.59 cm^2. - Aortic root: The aortic root was normal in size. - Ascending aorta: The ascending aorta was normal in size. - Mitral valve: Calcified annulus. Moderately thickened, severely  calcified leaflets . Mobility was restricted. The findings are  consistent with moderate stenosis. There was mild regurgitation.  Valve area by continuity equation (using LVOT flow): 0.82 cm^2. - Left atrium: The atrium was mildly dilated. - Right ventricle: The cavity size was mildly dilated. Wall  thickness was normal. Systolic function was mildly reduced. - Right atrium: The atrium was mildly dilated. - Tricuspid valve: There was moderate regurgitation. - Pulmonary arteries: Systolic pressure was moderately to severely  increased. PA peak pressure: 55 mm Hg (S). - Inferior vena cava: The vessel was dilated. The respirophasic  diameter changes were blunted (< 50%), consistent with elevated  central venous pressure. - Pericardium, extracardiac: There was no pericardial effusion.  Study Highlights     The left ventricular ejection fraction is normal (55-65%).  Nuclear stress EF: 62%.  The study is normal.  Normal stress nuclear study with no ischemia or infarction; EF 62 with normal wall motion; note patient in atrial flutter at time of study.    ASSESSMENT AND PLAN:  1. Paroxysmal atrial fibrillation. Frequency of paroxysmal atrial fibrillation and has increased. Now 39%. Rate is controlled. Coumadin on hold due to gastric tumor, bleeding and anemia. [place] Vasc score of 5. If gastric tumor is successfully removed hopefully we can resume Coumadin. If the tumor is unresectable would need to stay off coumadin due to risk of recurrent bleeding. 2. Tachybradycardia syndrome with functioning dual-chamber pacemaker. 3. Essential hypertension without heart failure. 4. Hypercholesterolemia followed by PCP 5. Mild to moderate aortic stenosis 7. history of left nephrectomy  secondary to kidney cancer 8. history of endometrial cancer 9. Gastric tumor c/w adenoCA. For surgery next week. Based on recent cardiac evaluation she is low risk from a cardiac standpoint.  10. History of cholesterol embolus to her left eye with central retinal artery occlusion and loss of vision     Current medicines are reviewed at length with the patient today.  The patient does not have concerns regarding medicines.  The following changes have been made:  none  Labs/ tests ordered today include:   No orders of the defined types were placed in this encounter.   Follow up in approximately 2 months post surgery.   Signed, Neely Kammerer [name] MD, [facility]    01/06/2016 12:46 PM    [address], [place], [place]  [address] Phone: [phone]; Fax: [phone]

## 2016-01-07 ENCOUNTER — Encounter (HOSPITAL_COMMUNITY): Payer: Self-pay

## 2016-01-07 ENCOUNTER — Encounter (HOSPITAL_COMMUNITY)
Admission: RE | Admit: 2016-01-07 | Discharge: 2016-01-07 | Disposition: A | Discharge status: Home / Self Care | Payer: Medicare Other | Source: Ambulatory Visit | Attending: General Surgery | Admitting: General Surgery

## 2016-01-07 DIAGNOSIS — K219 Gastro-esophageal reflux disease without esophagitis: Secondary | ICD-10-CM | POA: Insufficient documentation

## 2016-01-07 DIAGNOSIS — I110 Hypertensive heart disease with heart failure: Secondary | ICD-10-CM | POA: Diagnosis not present

## 2016-01-07 DIAGNOSIS — Z01812 Encounter for preprocedural laboratory examination: Secondary | ICD-10-CM | POA: Diagnosis not present

## 2016-01-07 DIAGNOSIS — Z950 Presence of cardiac pacemaker: Secondary | ICD-10-CM | POA: Diagnosis not present

## 2016-01-07 DIAGNOSIS — I2510 Atherosclerotic heart disease of native coronary artery without angina pectoris: Secondary | ICD-10-CM | POA: Insufficient documentation

## 2016-01-07 DIAGNOSIS — Z8673 Personal history of transient ischemic attack (TIA), and cerebral infarction without residual deficits: Secondary | ICD-10-CM | POA: Diagnosis not present

## 2016-01-07 DIAGNOSIS — Z85528 Personal history of other malignant neoplasm of kidney: Secondary | ICD-10-CM | POA: Insufficient documentation

## 2016-01-07 DIAGNOSIS — Z79899 Other long term (current) drug therapy: Secondary | ICD-10-CM | POA: Insufficient documentation

## 2016-01-07 DIAGNOSIS — K760 Fatty (change of) liver, not elsewhere classified: Secondary | ICD-10-CM | POA: Diagnosis not present

## 2016-01-07 DIAGNOSIS — Z01818 Encounter for other preprocedural examination: Secondary | ICD-10-CM | POA: Insufficient documentation

## 2016-01-07 DIAGNOSIS — I480 Paroxysmal atrial fibrillation: Secondary | ICD-10-CM | POA: Diagnosis not present

## 2016-01-07 DIAGNOSIS — E785 Hyperlipidemia, unspecified: Secondary | ICD-10-CM | POA: Insufficient documentation

## 2016-01-07 DIAGNOSIS — I495 Sick sinus syndrome: Secondary | ICD-10-CM | POA: Insufficient documentation

## 2016-01-07 DIAGNOSIS — E039 Hypothyroidism, unspecified: Secondary | ICD-10-CM | POA: Insufficient documentation

## 2016-01-07 DIAGNOSIS — Z8542 Personal history of malignant neoplasm of other parts of uterus: Secondary | ICD-10-CM | POA: Insufficient documentation

## 2016-01-07 DIAGNOSIS — C169 Malignant neoplasm of stomach, unspecified: Secondary | ICD-10-CM | POA: Diagnosis not present

## 2016-01-07 DIAGNOSIS — I509 Heart failure, unspecified: Secondary | ICD-10-CM | POA: Diagnosis not present

## 2016-01-07 HISTORY — DX: Anemia, unspecified: D64.9

## 2016-01-07 HISTORY — DX: Cerebral infarction, unspecified: I63.9

## 2016-01-07 LAB — BASIC METABOLIC PANEL
ANION GAP: 10 (ref 5–15)
BUN: 22 mg/dL — AB (ref 6–20)
CALCIUM: 8.9 mg/dL (ref 8.9–10.3)
CO2: 19 mmol/L — ABNORMAL LOW (ref 22–32)
Chloride: 111 mmol/L (ref 101–111)
Creatinine, Ser: 1.09 mg/dL — ABNORMAL HIGH (ref 0.44–1.00)
GFR calc Af Amer: 54 mL/min — ABNORMAL LOW (ref >60)
GFR, EST NON AFRICAN AMERICAN: 47 mL/min — AB (ref >60)
Glucose, Bld: 98 mg/dL (ref 65–99)
POTASSIUM: 4.3 mmol/L (ref 3.5–5.1)
SODIUM: 140 mmol/L (ref 135–145)

## 2016-01-07 LAB — PROTIME-INR
INR: 1.13 (ref 0.00–1.49)
PROTHROMBIN TIME: 14.7 s (ref 11.6–15.2)

## 2016-01-07 LAB — CBC WITH DIFFERENTIAL/PLATELET
BASOS ABS: 0 10*3/uL (ref 0.0–0.1)
BASOS PCT: 0 %
EOS PCT: 3 %
Eosinophils Absolute: 0.2 10*3/uL (ref 0.0–0.7)
HCT: 39.9 % (ref 36.0–46.0)
Hemoglobin: 12.5 g/dL (ref 12.0–15.0)
LYMPHS PCT: 23 %
Lymphs Abs: 1.7 10*3/uL (ref 0.7–4.0)
MCH: 27.7 pg (ref 26.0–34.0)
MCHC: 31.3 g/dL (ref 30.0–36.0)
MCV: 88.5 fL (ref 78.0–100.0)
Monocytes Absolute: 0.6 10*3/uL (ref 0.1–1.0)
Monocytes Relative: 8 %
Neutro Abs: 4.9 10*3/uL (ref 1.7–7.7)
Neutrophils Relative %: 66 %
PLATELETS: 201 10*3/uL (ref 150–400)
RBC: 4.51 MIL/uL (ref 3.87–5.11)
RDW: 18.2 % — AB (ref 11.5–15.5)
WBC: 7.3 10*3/uL (ref 4.0–10.5)

## 2016-01-07 LAB — URINALYSIS, ROUTINE W REFLEX MICROSCOPIC
BILIRUBIN URINE: NEGATIVE
Glucose, UA: NEGATIVE mg/dL
HGB URINE DIPSTICK: NEGATIVE
KETONES UR: NEGATIVE mg/dL
Leukocytes, UA: NEGATIVE
NITRITE: NEGATIVE
PROTEIN: NEGATIVE mg/dL
SPECIFIC GRAVITY, URINE: 1.014 (ref 1.005–1.030)
pH: 5.5 (ref 5.0–8.0)

## 2016-01-07 LAB — NO BLOOD PRODUCTS

## 2016-01-07 NOTE — Final Progress Note (Signed)
No bowel prep needed per triage nurse at office Chart sent to [facility] for review Coumadin stopped 3/17 jehavah's witness- reversal and document faxed to blood bank Clearance note in epic from dr [name] Pacer orders faxed to dr [name]

## 2016-01-07 NOTE — Pre-Procedure Instructions (Addendum)
[name]  01/07/2016      [id number] - [place], [address] [place] [address] Phone: [phone] Fax: [phone]    Your procedure is scheduled on 01/15/16.  Report to [facility] Admitting at 1000 A.M.  Call this number if you have problems the morning of surgery:  [phone]   Remember:  Do not eat food or drink liquids after midnight.  Take these medicines the morning of surgery with A SIP OF WATER atenolol, levothyroxine,lyrica,protonix, ,xanax if needed, nitro if needed, tramadol if needed  STOP all herbel meds, nsaids (aleve,naproxen,advil,ibuprofen) 5 days prior to surgery including vitamins(vit b, vit b 12,vit D, biotin) aspirin 01/10/16   Do not wear jewelry, make-up or nail polish.  Do not wear lotions, powders, or perfumes.  You may wear deodorant.  Do not shave 48 hours prior to surgery.  Men may shave face and neck.  Do not bring valuables to the hospital.  [facility] is not responsible for any belongings or valuables.  Contacts, dentures or bridgework may not be worn into surgery.  Leave your suitcase in the car.  After surgery it may be brought to your room.  For patients admitted to the hospital, discharge time will be determined by your treatment team.  Patients discharged the day of surgery will not be allowed to drive home.   Name and phone number of your driver:    Special instructions:   Special Instructions: [facility] - Preparing for Surgery  Before surgery, you can play an important role.  Because skin is not sterile, your skin needs to be as free of germs as possible.  You can reduce the number of germs on you skin by washing with CHG (chlorahexidine gluconate) soap before surgery.  CHG is an antiseptic cleaner which kills germs and bonds with the skin to continue killing germs even after washing.  Please DO NOT use if you have an allergy to CHG or antibacterial soaps.  If  your skin becomes reddened/irritated stop using the CHG and inform your nurse when you arrive at Short Stay.  Do not shave (including legs and underarms) for at least 48 hours prior to the first CHG shower.  You may shave your face.  Please follow these instructions carefully:   1.  Shower with CHG Soap the night before surgery and the morning of Surgery.  2.  If you choose to wash your hair, wash your hair first as usual with your normal shampoo.  3.  After you shampoo, rinse your hair and body thoroughly to remove the Shampoo.  4.  Use CHG as you would any other liquid soap.  You can apply chg directly  to the skin and wash gently with scrungie or a clean washcloth.  5.  Apply the CHG Soap to your body ONLY FROM THE NECK DOWN.  Do not use on open wounds or open sores.  Avoid contact with your eyes ears, mouth and genitals (private parts).  Wash genitals (private parts)       with your normal soap.  6.  Wash thoroughly, paying special attention to the area where your surgery will be performed.  7.  Thoroughly rinse your body with warm water from the neck down.  8.  DO NOT shower/wash with your normal soap after using and rinsing off the CHG Soap.  9.  Pat yourself dry with a clean towel.  10.  Wear clean pajamas.            11.  Place clean sheets on your bed the night of your first shower and do not sleep with pets.  Day of Surgery  Do not apply any lotions/deodorants the morning of surgery.  Please wear clean clothes to the hospital/surgery center.  Please read over the following fact sheets that you were given.coughing, surgical site inst,

## 2016-01-08 NOTE — Progress Notes (Signed)
Anesthesia Chart Review:  Pt is a 82 year old female scheduled for diagnostic laparoscopy, possible open gastrectomy on 01/15/2016 with [name].   Pt is a Jehovah's Witness, no blood products.   Cardiologist is Dr. [name] who has cleared pt for surgery at low risk. EP cardiology is Dr. Thompson Grayer who has also cleared pt for surgery. PCP is Dr. [name].   PMH includes:  CAD, PAF, tachycardia-bradycardia syndrome, pacemaker ([employer]tronic implanted 2011), moderate aortic stenosis, aortic root dilatation, CHF, HTN, hyperlipidemia, hepatic steatosis, stroke (2015), anemia, hypothyroidism, uterine cancer, renal cancer (s/p nephrectomy), adenocarcinoma of stomach, post-op N/V, GERD. Never smoker. BMI 27. S/p R TKA 02/07/12. S/p patent ductus arteriosus repair  Medications include: atenolol, lasix, levothyroxine, losartan, protonix, potassium  Preoperative labs reviewed.    Chest x-ray 11/14/15 reviewed. Stable bilateral pleural effusions. Note that on PET scan 12/01/15, no pleural effusion is seen.   EKG 11/07/15: Atrial fibrillation (92 bpm). Minimal ST depression, anterolateral leads. Prolonged QT interval  Nuclear stress test 11/21/15: Normal stress nuclear study with no ischemia or infarction; EF 62 with normal wall motion; note patient in atrial flutter at time of study.  Echo 11/14/15:  - Left ventricle: The cavity size was normal. There was mild concentric hypertrophy. Systolic function was normal. The estimated ejection fraction was in the range of 60% to 65%. Wall motion was normal; there were no regional wall motion abnormalities. The study was not technically sufficient to allow evaluation of LV diastolic dysfunction due to atrial fibrillation. - Aortic valve: Valve mobility was restricted. There was mild to moderate stenosis. There was moderate regurgitation. Peak gradient (S): 38 mm Hg. Valve area (VTI): 0.55 cm^2. Valve area (Vmax): 0.57 cm^2. Valve area (Vmean): 0.59 cm^2. -  Aortic root: The aortic root was normal in size. - Ascending aorta: The ascending aorta was normal in size. - Mitral valve: Calcified annulus. Moderately thickened, severely calcified leaflets . Mobility was restricted. The findings are consistent with moderate stenosis. There was mild regurgitation. Valve area by continuity equation (using LVOT flow): 0.82 cm^2. - Left atrium: The atrium was mildly dilated. - Right ventricle: The cavity size was mildly dilated. Wall thickness was normal. Systolic function was mildly reduced. - Right atrium: The atrium was mildly dilated. - Tricuspid valve: There was moderate regurgitation. - Pulmonary arteries: Systolic pressure was moderately to severely increased. PA peak pressure: 55 mm Hg (S). - Inferior vena cava: The vessel was dilated. The respirophasic diameter changes were blunted (< 50%), consistent with elevated central venous pressure. - Pericardium, extracardiac: There was no pericardial effusion.  Perioperative prescription form for pacemaker notes pt is not pacemaker dependent and that procedure may interfere with device function. Magnet should be placed over device during procedure. Post-op interrogation not needed.   If no changes, I anticipate pt can proceed with surgery as scheduled.   Valerie Cass, FNP-BC [facility] Short Stay Surgical Center/Anesthesiology Phone: [phone] 01/08/2016 3:29 PM

## 2016-01-13 ENCOUNTER — Ambulatory Visit: Payer: Self-pay | Admitting: Internal Medicine

## 2016-01-15 ENCOUNTER — Inpatient Hospital Stay (HOSPITAL_COMMUNITY): Payer: Medicare Other | Admitting: Certified Registered Nurse Anesthetist

## 2016-01-15 ENCOUNTER — Encounter (HOSPITAL_COMMUNITY): Payer: Self-pay | Admitting: Certified Registered Nurse Anesthetist

## 2016-01-15 ENCOUNTER — Encounter (HOSPITAL_COMMUNITY)
Admission: RE | Disposition: A | Discharge status: Home Health | Payer: Self-pay | Source: Ambulatory Visit | Attending: General Surgery

## 2016-01-15 ENCOUNTER — Inpatient Hospital Stay (HOSPITAL_COMMUNITY): Payer: Medicare Other | Admitting: Vascular Surgery

## 2016-01-15 ENCOUNTER — Inpatient Hospital Stay (HOSPITAL_COMMUNITY)
Admission: RE | Admit: 2016-01-15 | Discharge: 2016-01-26 | DRG: 327 | Disposition: A | Discharge status: Home Health | Payer: Medicare Other | Source: Ambulatory Visit | Attending: General Surgery | Admitting: General Surgery

## 2016-01-15 DIAGNOSIS — N183 Chronic kidney disease, stage 3 unspecified: Secondary | ICD-10-CM | POA: Diagnosis present

## 2016-01-15 DIAGNOSIS — Z8542 Personal history of malignant neoplasm of other parts of uterus: Secondary | ICD-10-CM | POA: Diagnosis not present

## 2016-01-15 DIAGNOSIS — C162 Malignant neoplasm of body of stomach: Secondary | ICD-10-CM | POA: Diagnosis not present

## 2016-01-15 DIAGNOSIS — D500 Iron deficiency anemia secondary to blood loss (chronic): Secondary | ICD-10-CM | POA: Diagnosis present

## 2016-01-15 DIAGNOSIS — K911 Postgastric surgery syndromes: Secondary | ICD-10-CM | POA: Diagnosis not present

## 2016-01-15 DIAGNOSIS — Z531 Procedure and treatment not carried out because of patient's decision for reasons of belief and group pressure: Secondary | ICD-10-CM

## 2016-01-15 DIAGNOSIS — E039 Hypothyroidism, unspecified: Secondary | ICD-10-CM | POA: Diagnosis present

## 2016-01-15 DIAGNOSIS — I482 Chronic atrial fibrillation: Secondary | ICD-10-CM | POA: Diagnosis present

## 2016-01-15 DIAGNOSIS — I480 Paroxysmal atrial fibrillation: Secondary | ICD-10-CM | POA: Diagnosis not present

## 2016-01-15 DIAGNOSIS — K3184 Gastroparesis: Secondary | ICD-10-CM | POA: Diagnosis present

## 2016-01-15 DIAGNOSIS — C439 Malignant melanoma of skin, unspecified: Secondary | ICD-10-CM | POA: Diagnosis present

## 2016-01-15 DIAGNOSIS — Z90710 Acquired absence of both cervix and uterus: Secondary | ICD-10-CM

## 2016-01-15 DIAGNOSIS — K210 Gastro-esophageal reflux disease with esophagitis: Secondary | ICD-10-CM | POA: Diagnosis present

## 2016-01-15 DIAGNOSIS — Z950 Presence of cardiac pacemaker: Secondary | ICD-10-CM | POA: Diagnosis not present

## 2016-01-15 DIAGNOSIS — Z86010 Personal history of colonic polyps: Secondary | ICD-10-CM | POA: Diagnosis not present

## 2016-01-15 DIAGNOSIS — Z808 Family history of malignant neoplasm of other organs or systems: Secondary | ICD-10-CM | POA: Diagnosis not present

## 2016-01-15 DIAGNOSIS — K219 Gastro-esophageal reflux disease without esophagitis: Secondary | ICD-10-CM | POA: Diagnosis not present

## 2016-01-15 DIAGNOSIS — E876 Hypokalemia: Secondary | ICD-10-CM | POA: Diagnosis not present

## 2016-01-15 DIAGNOSIS — K660 Peritoneal adhesions (postprocedural) (postinfection): Secondary | ICD-10-CM | POA: Diagnosis present

## 2016-01-15 DIAGNOSIS — C169 Malignant neoplasm of stomach, unspecified: Secondary | ICD-10-CM | POA: Diagnosis not present

## 2016-01-15 DIAGNOSIS — I2510 Atherosclerotic heart disease of native coronary artery without angina pectoris: Secondary | ICD-10-CM | POA: Diagnosis present

## 2016-01-15 DIAGNOSIS — I5032 Chronic diastolic (congestive) heart failure: Secondary | ICD-10-CM | POA: Diagnosis present

## 2016-01-15 DIAGNOSIS — I130 Hypertensive heart and chronic kidney disease with heart failure and stage 1 through stage 4 chronic kidney disease, or unspecified chronic kidney disease: Secondary | ICD-10-CM | POA: Diagnosis not present

## 2016-01-15 DIAGNOSIS — I69398 Other sequelae of cerebral infarction: Secondary | ICD-10-CM | POA: Diagnosis not present

## 2016-01-15 DIAGNOSIS — D630 Anemia in neoplastic disease: Secondary | ICD-10-CM | POA: Diagnosis present

## 2016-01-15 DIAGNOSIS — E440 Moderate protein-calorie malnutrition: Secondary | ICD-10-CM | POA: Diagnosis not present

## 2016-01-15 DIAGNOSIS — G629 Polyneuropathy, unspecified: Secondary | ICD-10-CM | POA: Diagnosis present

## 2016-01-15 DIAGNOSIS — Z8249 Family history of ischemic heart disease and other diseases of the circulatory system: Secondary | ICD-10-CM | POA: Diagnosis not present

## 2016-01-15 DIAGNOSIS — G43909 Migraine, unspecified, not intractable, without status migrainosus: Secondary | ICD-10-CM | POA: Diagnosis present

## 2016-01-15 DIAGNOSIS — Z905 Acquired absence of kidney: Secondary | ICD-10-CM

## 2016-01-15 DIAGNOSIS — Z85528 Personal history of other malignant neoplasm of kidney: Secondary | ICD-10-CM | POA: Diagnosis not present

## 2016-01-15 DIAGNOSIS — H548 Legal blindness, as defined in USA: Secondary | ICD-10-CM | POA: Diagnosis not present

## 2016-01-15 DIAGNOSIS — E785 Hyperlipidemia, unspecified: Secondary | ICD-10-CM | POA: Diagnosis not present

## 2016-01-15 DIAGNOSIS — G2581 Restless legs syndrome: Secondary | ICD-10-CM | POA: Diagnosis not present

## 2016-01-15 DIAGNOSIS — F419 Anxiety disorder, unspecified: Secondary | ICD-10-CM | POA: Diagnosis present

## 2016-01-15 DIAGNOSIS — Z823 Family history of stroke: Secondary | ICD-10-CM

## 2016-01-15 DIAGNOSIS — Z9889 Other specified postprocedural states: Secondary | ICD-10-CM

## 2016-01-15 DIAGNOSIS — Z903 Acquired absence of stomach [part of]: Secondary | ICD-10-CM

## 2016-01-15 DIAGNOSIS — Z832 Family history of diseases of the blood and blood-forming organs and certain disorders involving the immune mechanism: Secondary | ICD-10-CM

## 2016-01-15 DIAGNOSIS — K209 Esophagitis, unspecified: Secondary | ICD-10-CM | POA: Diagnosis not present

## 2016-01-15 DIAGNOSIS — I350 Nonrheumatic aortic (valve) stenosis: Secondary | ICD-10-CM

## 2016-01-15 DIAGNOSIS — Z800 Family history of malignant neoplasm of digestive organs: Secondary | ICD-10-CM

## 2016-01-15 DIAGNOSIS — I10 Essential (primary) hypertension: Secondary | ICD-10-CM | POA: Diagnosis present

## 2016-01-15 HISTORY — PX: LAPAROSCOPY: SHX197

## 2016-01-15 HISTORY — PX: GASTRECTOMY: SHX58

## 2016-01-15 LAB — VITAMIN B12: Vitamin B-12: 524 pg/mL (ref 180–914)

## 2016-01-15 LAB — RETICULOCYTES
RBC.: 4.2 MIL/uL (ref 3.87–5.11)
Retic Count, Absolute: 46.2 10*3/uL (ref 19.0–186.0)
Retic Ct Pct: 1.1 % (ref 0.4–3.1)

## 2016-01-15 LAB — IRON AND TIBC
IRON: 73 ug/dL (ref 28–170)
Saturation Ratios: 27 % (ref 10.4–31.8)
TIBC: 269 ug/dL (ref 250–450)
UIBC: 196 ug/dL

## 2016-01-15 LAB — GLUCOSE, CAPILLARY: Glucose-Capillary: 183 mg/dL — ABNORMAL HIGH (ref 65–99)

## 2016-01-15 LAB — FOLATE: Folate: 21.3 ng/mL (ref >5.9)

## 2016-01-15 LAB — FERRITIN: FERRITIN: 451 ng/mL — AB (ref 11–307)

## 2016-01-15 LAB — MRSA PCR SCREENING: MRSA by PCR: NEGATIVE

## 2016-01-15 SURGERY — LAPAROSCOPY, DIAGNOSTIC
Anesthesia: General | Site: Abdomen

## 2016-01-15 MED ORDER — ONDANSETRON HCL 4 MG/2ML IJ SOLN
INTRAMUSCULAR | Status: DC | PRN
  Administered 2016-01-15: 4 mg via INTRAVENOUS

## 2016-01-15 MED ORDER — LIDOCAINE HCL (PF) 1 % IJ SOLN
INTRAMUSCULAR | Status: AC
  Filled 2016-01-15: qty 30

## 2016-01-15 MED ORDER — DEXAMETHASONE SODIUM PHOSPHATE 10 MG/ML IJ SOLN
INTRAMUSCULAR | Status: AC
  Filled 2016-01-15: qty 1

## 2016-01-15 MED ORDER — BUPIVACAINE ON-Q PAIN PUMP (FOR ORDER SET NO CHG)
INJECTION | Status: AC
Start: 2016-01-15 — End: 2016-01-18

## 2016-01-15 MED ORDER — METOPROLOL TARTRATE 5 MG/5ML IV SOLN
5.0000 mg | Freq: Four times a day (QID) | INTRAVENOUS | Status: DC | PRN
  Filled 2016-01-15: qty 5

## 2016-01-15 MED ORDER — TRIAMCINOLONE ACETONIDE 0.1 % EX CREA: TOPICAL_CREAM | Freq: Two times a day (BID) | CUTANEOUS | Status: DC | PRN

## 2016-01-15 MED ORDER — SODIUM CHLORIDE 0.9% FLUSH: 9.0000 mL | INTRAVENOUS | Status: DC | PRN

## 2016-01-15 MED ORDER — ONDANSETRON HCL 4 MG/2ML IJ SOLN
INTRAMUSCULAR | Status: AC
Start: 2016-01-15 — End: 2016-01-15
  Filled 2016-01-15: qty 2

## 2016-01-15 MED ORDER — PROPOFOL 10 MG/ML IV BOLUS
INTRAVENOUS | Status: AC
  Filled 2016-01-15: qty 20

## 2016-01-15 MED ORDER — FENTANYL 40 MCG/ML IV SOLN
INTRAVENOUS | Status: DC
  Administered 2016-01-15 (×2): via INTRAVENOUS
  Administered 2016-01-16: 90 ug via INTRAVENOUS
  Administered 2016-01-16: 50 ug via INTRAVENOUS
  Administered 2016-01-16: 140 ug via INTRAVENOUS
  Administered 2016-01-17 (×2): 10 ug via INTRAVENOUS
  Administered 2016-01-17: 80 ug via INTRAVENOUS

## 2016-01-15 MED ORDER — DEXAMETHASONE SODIUM PHOSPHATE 10 MG/ML IJ SOLN
INTRAMUSCULAR | Status: DC | PRN
  Administered 2016-01-15: 10 mg via INTRAVENOUS

## 2016-01-15 MED ORDER — NEOSTIGMINE METHYLSULFATE 5 MG/5ML IV SOSY
PREFILLED_SYRINGE | INTRAVENOUS | Status: AC
  Filled 2016-01-15: qty 5

## 2016-01-15 MED ORDER — HYDRALAZINE HCL 20 MG/ML IJ SOLN: 10.0000 mg | INTRAMUSCULAR | Status: DC | PRN

## 2016-01-15 MED ORDER — DIPHENHYDRAMINE HCL 50 MG/ML IJ SOLN
INTRAMUSCULAR | Status: DC | PRN
  Administered 2016-01-15: 12.5 mg via INTRAVENOUS

## 2016-01-15 MED ORDER — LEVOTHYROXINE SODIUM 100 MCG IV SOLR
50.0000 ug | Freq: Every day | INTRAVENOUS | Status: DC
  Administered 2016-01-16 – 2016-01-17 (×2): 50 ug via INTRAVENOUS
  Filled 2016-01-15 (×3): qty 5

## 2016-01-15 MED ORDER — METOPROLOL TARTRATE 5 MG/5ML IV SOLN
5.0000 mg | Freq: Four times a day (QID) | INTRAVENOUS | Status: DC
Start: 2016-01-15 — End: 2016-01-16
  Administered 2016-01-15 – 2016-01-16 (×2): 5 mg via INTRAVENOUS
  Filled 2016-01-15 (×3): qty 5

## 2016-01-15 MED ORDER — NALOXONE HCL 0.4 MG/ML IJ SOLN: 0.4000 mg | INTRAMUSCULAR | Status: DC | PRN

## 2016-01-15 MED ORDER — DIPHENHYDRAMINE HCL 50 MG/ML IJ SOLN: 12.5000 mg | Freq: Four times a day (QID) | INTRAMUSCULAR | Status: DC | PRN

## 2016-01-15 MED ORDER — DIPHENHYDRAMINE HCL 12.5 MG/5ML PO ELIX: 12.5000 mg | ORAL_SOLUTION | Freq: Four times a day (QID) | ORAL | Status: DC | PRN

## 2016-01-15 MED ORDER — PHENYLEPHRINE HCL 10 MG/ML IJ SOLN
10.0000 mg | INTRAVENOUS | Status: DC | PRN
  Administered 2016-01-15: 30 ug/min via INTRAVENOUS

## 2016-01-15 MED ORDER — PANTOPRAZOLE SODIUM 40 MG IV SOLR
40.0000 mg | Freq: Every day | INTRAVENOUS | Status: DC
  Administered 2016-01-15 – 2016-01-19 (×5): 40 mg via INTRAVENOUS
  Filled 2016-01-15 (×5): qty 40

## 2016-01-15 MED ORDER — LIDOCAINE HCL (CARDIAC) 20 MG/ML IV SOLN
INTRAVENOUS | Status: DC | PRN
  Administered 2016-01-15: 50 mg via INTRAVENOUS

## 2016-01-15 MED ORDER — FENTANYL 40 MCG/ML IV SOLN
INTRAVENOUS | Status: AC
  Filled 2016-01-15: qty 25

## 2016-01-15 MED ORDER — FENTANYL CITRATE (PF) 250 MCG/5ML IJ SOLN
INTRAMUSCULAR | Status: AC
  Filled 2016-01-15: qty 5

## 2016-01-15 MED ORDER — LACTATED RINGERS IV SOLN
INTRAVENOUS | Status: DC
  Administered 2016-01-15 (×4): via INTRAVENOUS

## 2016-01-15 MED ORDER — BUPIVACAINE 0.25 % ON-Q PUMP DUAL CATH 300 ML
300.0000 mL | INJECTION | Status: AC
  Filled 2016-01-15: qty 300

## 2016-01-15 MED ORDER — METOCLOPRAMIDE HCL 5 MG/ML IJ SOLN
10.0000 mg | Freq: Once | INTRAMUSCULAR | Status: AC
  Administered 2016-01-15: 10 mg via INTRAVENOUS

## 2016-01-15 MED ORDER — FENTANYL CITRATE (PF) 100 MCG/2ML IJ SOLN
12.5000 ug | INTRAMUSCULAR | Status: DC | PRN
  Administered 2016-01-17: 25 ug via INTRAVENOUS
  Administered 2016-01-19: 12.5 ug via INTRAVENOUS
  Filled 2016-01-15 (×2): qty 2

## 2016-01-15 MED ORDER — SODIUM CHLORIDE 0.9 % IR SOLN
Status: DC | PRN
  Administered 2016-01-15 (×3): 1000 mL

## 2016-01-15 MED ORDER — ROCURONIUM BROMIDE 100 MG/10ML IV SOLN
INTRAVENOUS | Status: DC | PRN
  Administered 2016-01-15: 10 mg via INTRAVENOUS
  Administered 2016-01-15: 40 mg via INTRAVENOUS

## 2016-01-15 MED ORDER — VECURONIUM BROMIDE 10 MG IV SOLR
INTRAVENOUS | Status: DC | PRN
  Administered 2016-01-15: 4 mg via INTRAVENOUS

## 2016-01-15 MED ORDER — PHENYLEPHRINE 40 MCG/ML (10ML) SYRINGE FOR IV PUSH (FOR BLOOD PRESSURE SUPPORT)
PREFILLED_SYRINGE | INTRAVENOUS | Status: AC
  Filled 2016-01-15: qty 10

## 2016-01-15 MED ORDER — ONDANSETRON HCL 4 MG/2ML IJ SOLN: 4.0000 mg | Freq: Four times a day (QID) | INTRAMUSCULAR | Status: DC | PRN

## 2016-01-15 MED ORDER — EPHEDRINE 5 MG/ML INJ
INTRAVENOUS | Status: AC
  Filled 2016-01-15: qty 10

## 2016-01-15 MED ORDER — PHENYLEPHRINE HCL 10 MG/ML IJ SOLN
INTRAMUSCULAR | Status: DC | PRN
  Administered 2016-01-15: 120 ug via INTRAVENOUS
  Administered 2016-01-15: 40 ug via INTRAVENOUS
  Administered 2016-01-15: 80 ug via INTRAVENOUS
  Administered 2016-01-15 (×2): 120 ug via INTRAVENOUS

## 2016-01-15 MED ORDER — LIDOCAINE HCL 1 % IJ SOLN
INTRAMUSCULAR | Status: DC | PRN
  Administered 2016-01-15: 4 mL

## 2016-01-15 MED ORDER — ACETAMINOPHEN 10 MG/ML IV SOLN
1000.0000 mg | Freq: Four times a day (QID) | INTRAVENOUS | Status: AC
  Administered 2016-01-15 – 2016-01-16 (×4): 1000 mg via INTRAVENOUS
  Filled 2016-01-15 (×3): qty 100

## 2016-01-15 MED ORDER — SUGAMMADEX SODIUM 200 MG/2ML IV SOLN
INTRAVENOUS | Status: DC | PRN
  Administered 2016-01-15: 150 mg via INTRAVENOUS

## 2016-01-15 MED ORDER — ONDANSETRON HCL 4 MG/2ML IJ SOLN
INTRAMUSCULAR | Status: AC
  Filled 2016-01-15: qty 2

## 2016-01-15 MED ORDER — CEFAZOLIN SODIUM-DEXTROSE 2-4 GM/100ML-% IV SOLN
INTRAVENOUS | Status: AC
  Filled 2016-01-15: qty 100

## 2016-01-15 MED ORDER — ONDANSETRON HCL 4 MG/2ML IJ SOLN
4.0000 mg | Freq: Four times a day (QID) | INTRAMUSCULAR | Status: DC | PRN
  Administered 2016-01-17 – 2016-01-19 (×5): 4 mg via INTRAVENOUS
  Filled 2016-01-15 (×5): qty 2

## 2016-01-15 MED ORDER — CEFAZOLIN SODIUM-DEXTROSE 2-4 GM/100ML-% IV SOLN
2.0000 g | Freq: Three times a day (TID) | INTRAVENOUS | Status: AC
  Administered 2016-01-15: 2 g via INTRAVENOUS
  Filled 2016-01-15: qty 100

## 2016-01-15 MED ORDER — LACTATED RINGERS IV SOLN: INTRAVENOUS | Status: DC | PRN

## 2016-01-15 MED ORDER — PROPOFOL 10 MG/ML IV BOLUS
INTRAVENOUS | Status: DC | PRN
  Administered 2016-01-15: 120 mg via INTRAVENOUS
  Administered 2016-01-15: 80 mg via INTRAVENOUS

## 2016-01-15 MED ORDER — LIDOCAINE 2% (20 MG/ML) 5 ML SYRINGE
INTRAMUSCULAR | Status: AC
  Filled 2016-01-15: qty 5

## 2016-01-15 MED ORDER — METOCLOPRAMIDE HCL 5 MG/ML IJ SOLN
INTRAMUSCULAR | Status: AC
  Filled 2016-01-15: qty 2

## 2016-01-15 MED ORDER — DEXTROSE 5 % IV SOLN: 500.0000 mg | Freq: Three times a day (TID) | INTRAVENOUS | Status: DC | PRN

## 2016-01-15 MED ORDER — SODIUM CHLORIDE 0.9 % IV SOLN
INTRAVENOUS | Status: DC | PRN
  Administered 2016-01-15: 13:00:00 via INTRAVENOUS

## 2016-01-15 MED ORDER — FENTANYL CITRATE (PF) 100 MCG/2ML IJ SOLN
INTRAMUSCULAR | Status: DC | PRN
  Administered 2016-01-15 (×2): 50 ug via INTRAVENOUS
  Administered 2016-01-15: 100 ug via INTRAVENOUS
  Administered 2016-01-15: 50 ug via INTRAVENOUS
  Administered 2016-01-15: 100 ug via INTRAVENOUS
  Administered 2016-01-15: 50 ug via INTRAVENOUS

## 2016-01-15 MED ORDER — ACETAMINOPHEN 10 MG/ML IV SOLN
INTRAVENOUS | Status: AC
  Filled 2016-01-15: qty 100

## 2016-01-15 MED ORDER — DIPHENHYDRAMINE HCL 12.5 MG/5ML PO ELIX
12.5000 mg | ORAL_SOLUTION | Freq: Four times a day (QID) | ORAL | Status: DC | PRN
  Filled 2016-01-15: qty 5

## 2016-01-15 MED ORDER — ROCURONIUM BROMIDE 50 MG/5ML IV SOLN
INTRAVENOUS | Status: AC
  Filled 2016-01-15: qty 1

## 2016-01-15 MED ORDER — BUPIVACAINE 0.25 % ON-Q PUMP DUAL CATH 300 ML
INJECTION | Status: DC | PRN
  Administered 2016-01-15: 300 mL

## 2016-01-15 MED ORDER — KCL IN DEXTROSE-NACL 20-5-0.45 MEQ/L-%-% IV SOLN
INTRAVENOUS | Status: DC
  Administered 2016-01-15 – 2016-01-16 (×2): via INTRAVENOUS
  Filled 2016-01-15 (×5): qty 1000

## 2016-01-15 MED ORDER — BUPIVACAINE-EPINEPHRINE (PF) 0.25% -1:200000 IJ SOLN
INTRAMUSCULAR | Status: AC
  Filled 2016-01-15: qty 30

## 2016-01-15 MED ORDER — GLYCOPYRROLATE 0.2 MG/ML IV SOSY
PREFILLED_SYRINGE | INTRAVENOUS | Status: AC
  Filled 2016-01-15: qty 6

## 2016-01-15 MED ORDER — CEFAZOLIN SODIUM-DEXTROSE 2-4 GM/100ML-% IV SOLN
2.0000 g | INTRAVENOUS | Status: AC
  Administered 2016-01-15: 2 g via INTRAVENOUS

## 2016-01-15 MED ORDER — ONDANSETRON 4 MG PO TBDP
4.0000 mg | ORAL_TABLET | Freq: Four times a day (QID) | ORAL | Status: DC | PRN
  Filled 2016-01-15: qty 1

## 2016-01-15 SURGICAL SUPPLY — 77 items
BLADE SURG ROTATE 9660 (MISCELLANEOUS) IMPLANT
CANISTER SUCTION 2500CC (MISCELLANEOUS) ×3 IMPLANT
CATH KIT ON-Q SILVERSOAK 7.5IN (CATHETERS) ×6 IMPLANT
CHLORAPREP W/TINT 26ML (MISCELLANEOUS) ×3 IMPLANT
CLIP LIGATING HEM O LOK PURPLE (MISCELLANEOUS) IMPLANT
CLIP LIGATING HEMO O LOK GREEN (MISCELLANEOUS) IMPLANT
CLIP LIGATING HEMOLOK MED (MISCELLANEOUS) IMPLANT
CLIP TI LARGE 6 (CLIP) IMPLANT
CLIP TI MEDIUM 24 (CLIP) IMPLANT
COVER SURGICAL LIGHT HANDLE (MISCELLANEOUS) ×3 IMPLANT
DECANTER SPIKE VIAL GLASS SM (MISCELLANEOUS) ×3 IMPLANT
DRAIN PENROSE 1/2X36 STERILE (WOUND CARE) IMPLANT
DRAPE LAPAROSCOPIC ABDOMINAL (DRAPES) IMPLANT
DRAPE UTILITY XL STRL (DRAPES) IMPLANT
DRAPE WARM FLUID 44X44 (DRAPE) ×3 IMPLANT
DRSG COVADERM 4X10 (GAUZE/BANDAGES/DRESSINGS) IMPLANT
DRSG COVADERM 4X8 (GAUZE/BANDAGES/DRESSINGS) IMPLANT
DRSG OPSITE POSTOP 4X10 (GAUZE/BANDAGES/DRESSINGS) ×3 IMPLANT
ELECT BLADE 6.5 EXT (BLADE) ×3 IMPLANT
ELECT REM PT RETURN 9FT ADLT (ELECTROSURGICAL) ×3
ELECTRODE REM PT RTRN 9FT ADLT (ELECTROSURGICAL) ×1 IMPLANT
GAUZE SPONGE 4X4 12PLY STRL (GAUZE/BANDAGES/DRESSINGS) IMPLANT
GLOVE BIO SURGEON STRL SZ 6 (GLOVE) ×6 IMPLANT
GLOVE BIO SURGEON STRL SZ 6.5 (GLOVE) ×4 IMPLANT
GLOVE BIO SURGEON STRL SZ7 (GLOVE) ×3 IMPLANT
GLOVE BIO SURGEONS STRL SZ 6.5 (GLOVE) ×2
GLOVE BIOGEL PI IND STRL 6.5 (GLOVE) ×2 IMPLANT
GLOVE BIOGEL PI IND STRL 7.0 (GLOVE) ×4 IMPLANT
GLOVE BIOGEL PI IND STRL 8 (GLOVE) ×2 IMPLANT
GLOVE BIOGEL PI INDICATOR 6.5 (GLOVE) ×4
GLOVE BIOGEL PI INDICATOR 7.0 (GLOVE) ×8
GLOVE BIOGEL PI INDICATOR 8 (GLOVE) ×4
GLOVE ECLIPSE 6.0 STRL STRAW (GLOVE) ×3 IMPLANT
GLOVE ECLIPSE 7.5 STRL STRAW (GLOVE) ×3 IMPLANT
GLOVE ECLIPSE 8.0 STRL XLNG CF (GLOVE) ×3 IMPLANT
GOWN STRL REUS W/ TWL LRG LVL3 (GOWN DISPOSABLE) ×3 IMPLANT
GOWN STRL REUS W/TWL 2XL LVL3 (GOWN DISPOSABLE) ×3 IMPLANT
GOWN STRL REUS W/TWL LRG LVL3 (GOWN DISPOSABLE) ×6
KIT BASIN OR (CUSTOM PROCEDURE TRAY) ×3 IMPLANT
KIT ROOM TURNOVER OR (KITS) ×3 IMPLANT
LIQUID BAND (GAUZE/BANDAGES/DRESSINGS) IMPLANT
NS IRRIG 1000ML POUR BTL (IV SOLUTION) ×9 IMPLANT
PACK GENERAL/GYN (CUSTOM PROCEDURE TRAY) ×3 IMPLANT
PAD ARMBOARD 7.5X6 YLW CONV (MISCELLANEOUS) ×6 IMPLANT
RELOAD PROXIMATE 75MM BLUE (ENDOMECHANICALS) ×12 IMPLANT
SCISSORS LAP 5X35 DISP (ENDOMECHANICALS) IMPLANT
SET IRRIG TUBING LAPAROSCOPIC (IRRIGATION / IRRIGATOR) IMPLANT
SHEARS FOC LG CVD HARMONIC 17C (MISCELLANEOUS) ×3 IMPLANT
SLEEVE ENDOPATH XCEL 5M (ENDOMECHANICALS) ×3 IMPLANT
SPECIMEN JAR LARGE (MISCELLANEOUS) ×3 IMPLANT
SPECIMEN JAR X LARGE (MISCELLANEOUS) IMPLANT
SPONGE LAP 18X18 X RAY DECT (DISPOSABLE) ×6 IMPLANT
STAPLER ECHELON FLEX (STAPLE) IMPLANT
STAPLER PROXIMATE 75MM BLUE (STAPLE) ×3 IMPLANT
STAPLER VISISTAT 35W (STAPLE) ×3 IMPLANT
SUCTION POOLE TIP (SUCTIONS) ×3 IMPLANT
SUT MNCRL AB 4-0 PS2 18 (SUTURE) IMPLANT
SUT PDS AB 1 TP1 96 (SUTURE) ×6 IMPLANT
SUT PDS AB 3-0 SH 27 (SUTURE) ×6 IMPLANT
SUT PDS II 0 TP-1 LOOPED 60 (SUTURE) IMPLANT
SUT SILK 2 0 SH CR/8 (SUTURE) ×3 IMPLANT
SUT SILK 2 0 TIES 10X30 (SUTURE) ×3 IMPLANT
SUT SILK 3 0 SH CR/8 (SUTURE) ×3 IMPLANT
SUT SILK 3 0 TIES 10X30 (SUTURE) ×3 IMPLANT
SUT SILK 3 0SH CR/8 30 (SUTURE) ×3 IMPLANT
TOWEL OR 17X24 6PK STRL BLUE (TOWEL DISPOSABLE) IMPLANT
TOWEL OR 17X26 10 PK STRL BLUE (TOWEL DISPOSABLE) ×3 IMPLANT
TRAY FOLEY CATH 14FRSI W/METER (CATHETERS) ×3 IMPLANT
TRAY LAPAROSCOPIC MC (CUSTOM PROCEDURE TRAY) ×3 IMPLANT
TROCAR XCEL BLUNT TIP 100MML (ENDOMECHANICALS) ×3 IMPLANT
TROCAR XCEL NON-BLD 11X100MML (ENDOMECHANICALS) IMPLANT
TROCAR XCEL NON-BLD 5MMX100MML (ENDOMECHANICALS) ×3 IMPLANT
TUBING INSUF HEATED (TUBING) ×3 IMPLANT
TUBING INSUFFLATION (TUBING) IMPLANT
TUNNELER SHEATH ON-Q 16GX12 DP (PAIN MANAGEMENT) ×3 IMPLANT
WATER STERILE IRR 1000ML POUR (IV SOLUTION) IMPLANT
YANKAUER SUCT BULB TIP NO VENT (SUCTIONS) ×3 IMPLANT

## 2016-01-15 NOTE — Anesthesia Postprocedure Evaluation (Signed)
Anesthesia Post Note  Patient: [name]  Procedure(s) Performed: Procedure(s) (LRB): LAPAROSCOPY DIAGNOSTIC (N/A) OPEN PARTIAL GASTRECTOMY (N/A)  Patient location during evaluation: PACU Anesthesia Type: General Level of consciousness: awake, awake and alert and sedated Pain management: pain level controlled Vital Signs Assessment: post-procedure vital signs reviewed and stable Respiratory status: spontaneous breathing, nonlabored ventilation and respiratory function stable Cardiovascular status: blood pressure returned to baseline Anesthetic complications: no    Last Vitals:  Filed Vitals:   01/15/16 1718 01/15/16 1729  BP: 122/84   Pulse:  86  Temp:    Resp:  16    Last Pain:  Filed Vitals:   01/15/16 1737  PainSc: 0-No pain                 Stevi Hollinshead COKER

## 2016-01-15 NOTE — Transfer of Care (Signed)
Immediate Anesthesia Transfer of Care Note  Patient: [name]  Procedure(s) Performed: Procedure(s): LAPAROSCOPY DIAGNOSTIC (N/A) OPEN PARTIAL GASTRECTOMY (N/A)  Patient Location: PACU  Anesthesia Type:General  Level of Consciousness: oriented and patient cooperative, drowsy. Comfortable.   Airway & Oxygen Therapy: Patient Spontanous Breathing and Patient connected to nasal cannula oxygen  Post-op Assessment: Report given to RN and Post -op Vital signs reviewed and stable  Post vital signs: Reviewed and stable  Last Vitals:  Filed Vitals:   01/15/16 1016  BP: 152/67  Pulse: 75  Temp: 36.8 C  Resp: 18    Last Pain: There were no vitals filed for this visit.    Patients Stated Pain Goal: 8 ([id number])  Complications: No apparent anesthesia complications   Patient denies pain.

## 2016-01-15 NOTE — H&P (Signed)
[name]  Location: [facility] Patient #: [id number] DOB: 1933-10-06 Married / Language: English / Race: White Female   History of Present Illness Patient words: gastric cancer.  The patient is a 82 year old female who presents with gastric cancer. Patient is a 82 year old female that I saw while she was an inpatient at [facility]. She presented with an upper GI bleed. She was found to have a gastric cancer in the body of the stomach. She has been off anticoagulation since that time. She currently had been on blood thinning agents for atrial fibrillation. She also had a questionable pneumonia prior to admission. She is a Restaurant manager, fast food.  Since going home, she has seen Dr. Rayann Heman and has been cleared for surgery. She also has seen Dr. Irene Limbo with oncology and has received intravenous iron therapy. She is eating okay overall, but finds that she has to eat more often and eat less at each meal. She does note that she has significant discomforts when she is more hungry.  EGD Findings: A large, polypoid mass with oozing bleeding and stigmata of recent bleeding was found on the anterior wall of the stomach. - Likely malignant gastric tumor on the anterior wall of the stomach. Biopsied. - The examination was otherwise normal.  Diagnosis Stomach, biopsy - MICROSCOPIC ATYPICAL GLANDULAR FRAGMENT HIGHLY SUSPICIOUS COR ADENOCARCINOMA ASSOCIATED WITH ULCER. - SEE MICROSCOPIC DESCRIPTION.  PET IMPRESSION: 1. Hypermetabolic proximal gastric lesion, consistent with the given history of adenocarcinoma. 2. Hypermetabolic inguinal lymph nodes are of uncertain etiology. A lymphoproliferative disorder cannot be excluded. 3. Coronary artery calcification.   Other Problems  Anxiety Disorder Atrial Fibrillation Back Pain Cancer Chest pain Cholelithiasis Congestive Heart Failure Gastroesophageal Reflux Disease General anesthesia - complications Heart  murmur High blood pressure Melanoma Migraine Headache Oophorectomy Bilateral. Thyroid Disease Vascular Disease  Past Surgical History  Appendectomy Cataract Surgery Bilateral. Colon Polyp Removal - Colonoscopy Gallbladder Surgery - Open Hip Surgery Right. Hysterectomy (due to cancer) - Complete Knee Surgery Right. Nephrectomy Left. Tonsillectomy  Diagnostic Studies History  Colonoscopy within last year Mammogram >3 years ago Pap Smear >5 years ago  Medication History  Lyrica (75MG  Capsule, Oral daily) Active. Cyanocobalamin (1000MCG/ML Solution, Injection daily) Active. Levothyroxine Sodium (100MCG Tablet, Oral daily) Active. Furosemide (20MG  Tablet, Oral daily) Active. Klor-Con M20 [facility] Tablet ER, Oral daily) Active. Sertraline HCl (50MG  Tablet, Oral daily) Active. Sucralfate (1GM Tablet, Oral daily) Active. [employer] B Complex Plus C (Oral daily) Active. Biotin (5000MCG Capsule, Oral daily) Active. Medications Reconciled  Social History Caffeine use Carbonated beverages, Coffee, Tea. No alcohol use No drug use Tobacco use Never smoker.  Family History Anesthetic complications Daughter. Arthritis Daughter. Bleeding disorder Mother. Cancer Daughter, Mother, Sister. Cerebrovascular Accident Brother, Mother. Colon Cancer Father, Mother, Sister. Colon Polyps Father, Mother, Sister. Heart Disease Mother. Hypertension Brother, Daughter, Mother, Sister. Melanoma Mother. Migraine Headache Mother, Sister. Thyroid problems Brother, Mother.  Pregnancy / Birth History Age at menarche 68 years. Age of menopause <47 Contraceptive History Oral contraceptives. Gravida 4 Irregular periods Maternal age 82-20 Para 3    Review of Systems  General Present- Fatigue. Not Present- Appetite Loss, Chills, Fever, Night Sweats, Weight Gain and Weight Loss. Skin Not Present- Change in Wart/Mole, Dryness, Hives, Jaundice, New  Lesions, Non-Healing Wounds, Rash and Ulcer. HEENT Present- Hearing Loss, Seasonal Allergies and Wears glasses/contact lenses. Not Present- Earache, Hoarseness, Nose Bleed, Oral Ulcers, Ringing in the Ears, Sinus Pain, Sore Throat, Visual Disturbances and Yellow Eyes. Respiratory Present- Snoring and Wheezing.  Not Present- Bloody sputum, Chronic Cough and Difficulty Breathing. Breast Not Present- Breast Mass, Breast Pain, Nipple Discharge and Skin Changes. Cardiovascular Present- Difficulty Breathing Lying Down, Rapid Heart Rate and Shortness of Breath. Not Present- Chest Pain, Leg Cramps, Palpitations and Swelling of Extremities. Gastrointestinal Present- Chronic diarrhea, Gets full quickly at meals and Indigestion. Not Present- Abdominal Pain, Bloating, Bloody Stool, Change in Bowel Habits, Constipation, Difficulty Swallowing, Excessive gas, Hemorrhoids, Nausea, Rectal Pain and Vomiting. Female Genitourinary Not Present- Frequency, Nocturia, Painful Urination, Pelvic Pain and Urgency. Musculoskeletal Present- Back Pain. Not Present- Joint Pain, Joint Stiffness, Muscle Pain, Muscle Weakness and Swelling of Extremities. Neurological Present- Numbness, Trouble walking and Weakness. Not Present- Decreased Memory, Fainting, Headaches, Seizures, Tingling and Tremor. Psychiatric Present- Anxiety. Not Present- Bipolar, Change in Sleep Pattern, Depression, Fearful and Frequent crying. Hematology Not Present- Easy Bruising, Excessive bleeding, Gland problems, HIV and Persistent Infections.  Vitals Weight: 146.2 lb Height: 61in Body Surface Area: 1.65 m Body Mass Index: 27.62 kg/m  Temp.: 97.62F(Oral)  Pulse: 66 (Regular)  BP: 118/78 (Sitting, Left Arm, Standard)       Physical Exam General Mental Status-Alert. General Appearance-Consistent with stated age. Hydration-Well hydrated. Voice-Normal.  Head and Neck Head-normocephalic, atraumatic with no lesions or palpable  masses. Trachea-midline. Thyroid Gland Characteristics - normal size and consistency.  Eye Eyeball - Bilateral-Extraocular movements intact. Sclera/Conjunctiva - Bilateral-No scleral icterus.  Chest and Lung Exam Chest and lung exam reveals -quiet, even and easy respiratory effort with no use of accessory muscles and on auscultation, normal breath sounds, no adventitious sounds and normal vocal resonance. Inspection Chest Wall - Normal. Back - normal.  Cardiovascular Cardiovascular examination reveals -normal heart sounds, regular rate and rhythm with no murmurs and normal pedal pulses bilaterally.  Abdomen Inspection Inspection of the abdomen reveals - No Hernias. Palpation/Percussion Palpation and Percussion of the abdomen reveal - Soft, Non Tender, No Rebound tenderness, No Rigidity (guarding) and No hepatosplenomegaly. Auscultation Auscultation of the abdomen reveals - Bowel sounds normal. Note: lower midline scar, left subcostal scar.   Neurologic Neurologic evaluation reveals -alert and oriented x 3 with no impairment of recent or remote memory. Mental Status-Normal.  Musculoskeletal Global Assessment -Note: no gross deformities.  Normal Exam - Left-Upper Extremity Strength Normal and Lower Extremity Strength Normal. Normal Exam - Right-Upper Extremity Strength Normal and Lower Extremity Strength Normal.  Lymphatic Head & Neck  General Head & Neck Lymphatics: Bilateral - Description - Normal. Axillary  General Axillary Region: Bilateral - Description - Normal. Tenderness - Non Tender. Femoral & Inguinal  Generalized Femoral & Inguinal Lymphatics: Bilateral - Description - No Generalized lymphadenopathy.    Assessment & Plan GASTRIC ADENOCARCINOMA (C16.9) Impression: The patient has a diagnosis of gastric carcinoma. This does look like it is in the body of the stomach. I discussed with the patient and 2 of her daughters that we would plan  to do a partial gastrectomy barring findings of carcinomatosis. I discussed that sometimes we take the distal portion of the stomach and sometimes when he did take the proximal portion of the stomach. Also reviewed that if we took the proximal portion of the stomach there is a higher chance for leak and that there would be a postoperative drain. I reviewed that there are occasions were people with significant blood with the surgery. She is Jehovah's Witness and does not want to receive any blood products. We discussed the risks of that. He also reviewed the risks of postoperative atrial fibrillation with rapid ventricular response.  I would at least send her to a stepdown unit. I discussed the risks of weight loss, delayed gastric emptying, need for external feeding access, possible blood clot, possible other infections, possible wound complications.  We also had a discussion regarding what we would do if we did not do surgery. We reviewed what the possible outcomes would be. In looking at her risks of surgery versus her risks of doing nothing, the patient and family agree with me that the risks are likely lower by proceeding with resection. We will set this up at the first available opportunity. I reviewed that she would likely have a prolonged period of time where she would feel weak postoperatively. I discussed that she may need to go to a nursing home temporarily.  Treatment team. Jennefer Bravo Current Plans You are being scheduled for surgery - Our schedulers will call you.  You should hear from our office's scheduling department within 5 working days about the location, date, and time of surgery. We try to make accommodations for patient's preferences in scheduling surgery, but sometimes the OR schedule or the surgeon's schedule prevents [place] from making those accommodations.  If you have not heard from our office [phone]) in 5 working days, call the office and ask for your  surgeon's nurse.  If you have other questions about your diagnosis, plan, or surgery, call the office and ask for your surgeon's nurse.  Pt Education - Stomach Cancer (Gastric Cancer): cancer Referred to Genetic Counseling, for evaluation and follow up (Medical Genetics). Routine. CHRONIC ATRIAL FIBRILLATION (I48.2) Impression: Patient is off blood thinners secondary to gastric cancer.  Has seen cardiology. Will likely need IV dilt gtt post op. REFUSAL OF BLOOD TRANSFUSIONS AS PATIENT IS JEHOVAH'S WITNESS (Z53.1) Impression: Pt to get IV iron and epo pre op for maximizing HCT. 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    Signed by Stark Klein, MD

## 2016-01-15 NOTE — Op Note (Signed)
PRE-OPERATIVE DIAGNOSIS: gastric cancer  POST-OPERATIVE DIAGNOSIS:  Same  PROCEDURE:  Procedure(s): Diagnostic laparoscopy, open subtotal gastrectomy  SURGEON:  Surgeon(s): Stark Klein, MD  ASSISTANT:   Jackolyn Confer, MD  ANESTHESIA:   local and general  DRAINS: none   LOCAL MEDICATIONS USED:  MARCAINE    and LIDOCAINE   SPECIMEN:  Source of Specimen:  central stomach  DISPOSITION OF SPECIMEN:  PATHOLOGY  COUNTS:  YES  DICTATION: .Dragon Dictation  PLAN OF CARE: Admit to inpatient   PATIENT DISPOSITION:  PACU - hemodynamically stable.  FINDINGS:  Large pedunculated gastric mass in proximal third of stomach  EBL: 150 mL  PROCEDURE:   Patient was identified ranging the operating room where she was placed supine on operating room table. General anesthesia was induced. An arterial line was placed. Her arms were tucked and a Foley catheter was placed. Her abdomen was then prepped and draped in sterile fashion. A timeout was performed according to the surgical safelist. When all was correct, we continued.   A vertical 1.5 cm incision was made just above the umbilicus after administration of local anesthesia. This subcutaneous tissues were spread with a Kelly clamp. The fascia was elevated with 2 Kochers and the fascia was incised in the midline.  A 0 Vicryl pursestring suture was placed around the fascial incision. The Hasson was introduced into the abdomen and held in place to the abdominal wall with the tails of the suture.  There were minimal adhesions to the abdominal wall despite the multiple prior surgeries.  No evidence of metastatic disease was seen.  A midline incision was made with a #10 blade.  The cautery was used to open the remainder of the incision.  Several omental adhesions were taken down with the cautery.  The bookwalter retractor was used to assist with visualization. The stomach and omentum were taken off the colon.  The Harmonic scalpel was used to take  down the omentum and the short gastrics.  The tumor was identified and the stomach was mobilized.  An appropriate site was located 8 cm distal to the tumor.  The GIA stapler was used to divide the stomach distally.  The antrum was left behind.  The cardia was left and the stomach was transected 6 cm proximal to the stomach.  Stay sutures were placed on the left and right side of the stomach.  The stomach was opened and gross margins were widely clear.  The distal stomach laid nicely on the proximal cardia remnant.  The linear stapler was used to create a side to side functional end to end anastamosis.  The NGT was passed through the anastamosis and secured in place.  The defect was closed with 3-0 PDS in running connell fashion.  3-0 silk sutures were placed to reinforce the staple line. The portion of omentum that was left was wrapped around the anastamosis.  The abdomen was irrigated. The OnQ tunnelers were placed in the preperitoneal space.  The fascia was closed with running # PDS suture.  The skin was reapproximated with staples.  The OnQ catheters were advanced through the sheaths.  The wounds were cleaned, dried, and dressed with soft sterile dressings.    The patient was allowed to emerge from anesthesia and taken to the PACU in stable condition.  Needle, sponge, and instrument counts were correct x 2.

## 2016-01-15 NOTE — Interval H&P Note (Signed)
History and Physical Interval Note:  01/15/2016 11:49 AM  [name]  has presented today for surgery, with the diagnosis of Gastric cancer  The various methods of treatment have been discussed with the patient and family. After consideration of risks, benefits and other options for treatment, the patient has consented to  Procedure(s): LAPAROSCOPY DIAGNOSTIC (N/A) POSSIBLE OPEN PARTIAL GASTRECTOMY (N/A) as a surgical intervention .  The patient's history has been reviewed, patient examined, no change in status, stable for surgery.  I have reviewed the patient's chart and labs.  Questions were answered to the patient's satisfaction.     Jaye Saal

## 2016-01-15 NOTE — Anesthesia Procedure Notes (Signed)
Procedure Name: Intubation Date/Time: 01/15/2016 1:18 PM Performed by: Rejeana Brock L Pre-anesthesia Checklist: Patient identified, Timeout performed, Emergency Drugs available, Suction available and Patient being monitored Patient Re-evaluated:Patient Re-evaluated prior to inductionOxygen Delivery Method: Circle system utilized Preoxygenation: Pre-oxygenation with 100% oxygen Intubation Type: IV induction Ventilation: Mask ventilation without difficulty Laryngoscope Size: Mac and 3 Grade View: Grade I Tube type: Oral Tube size: 7.0 mm Number of attempts: 1 Airway Equipment and Method: Stylet Placement Confirmation: ETT inserted through vocal cords under direct vision,  positive ETCO2 and breath sounds checked- equal and bilateral Secured at: 21 cm Tube secured with: Tape Dental Injury: Teeth and Oropharynx as per pre-operative assessment

## 2016-01-15 NOTE — Anesthesia Preprocedure Evaluation (Addendum)
Anesthesia Evaluation  Patient identified by MRN, date of birth, ID band Patient awake    Reviewed: Allergy & Precautions, NPO status , Patient's Chart, lab work & pertinent test results, reviewed documented beta blocker date and time   History of Anesthesia Complications (+) PONV and history of anesthetic complications  Airway Mallampati: II  TM Distance: >3 FB Neck ROM: Full    Dental  (+) Dental Advisory Given, Upper Dentures   Pulmonary pneumonia, unresolved,     + decreased breath sounds      Cardiovascular hypertension, Pt. on medications and Pt. on home beta blockers + CAD and + Peripheral Vascular Disease  Normal cardiovascular exam+ dysrhythmias Atrial Fibrillation + pacemaker + Valvular Problems/Murmurs AS and AI  Rhythm:Regular Rate:Normal  PDA ligation at age 82 Tachy-brady syndrome s/p PPM insertion  Echo 2/17: Study Conclusions  - Left ventricle: The cavity size was normal. Wall thickness wasincreased in a pattern of moderate LVH. Systolic function wasnormal. The estimated ejection fraction was in the range of 60%to 65%. Wall motion was normal; there were no regional wallmotion abnormalities. - Aortic valve: There was mild stenosis. There was moderateregurgitation. Valve area (VTI): 0.87 cm^2. Valve area (Vmax):0.87 cm^2. Valve area (Vmean): 0.85 cm^2. - Left atrium: The atrium was mildly dilated. - Tricuspid valve: There was moderate regurgitation.    Neuro/Psych  Headaches, PSYCHIATRIC DISORDERS Anxiety  Neuromuscular disease (neuropathy right leg)    GI/Hepatic Neg liver ROS, GERD  Medicated and Poorly Controlled,GIB Gastroparesis   Endo/Other  Hypothyroidism   Renal/GU Renal InsufficiencyRenal diseaseRenal cancer s/p nephrectomy     Musculoskeletal negative musculoskeletal ROS (+)   Abdominal   Peds  Hematology  (+) Blood dyscrasia, anemia , JEHOVAH'S WITNESSPatient states that she IS OKAY  WITH ALBUMIN AND CELL SAVER   Anesthesia Other Findings Day of surgery medications reviewed with the patient.  Reproductive/Obstetrics                           Anesthesia Physical  Anesthesia Plan  ASA: III  Anesthesia Plan: General   Post-op Pain Management:    Induction: Intravenous  Airway Management Planned: Oral ETT  Additional Equipment:   Intra-op Plan:   Post-operative Plan:   Informed Consent: I have reviewed the patients History and Physical, chart, labs and discussed the procedure including the risks, benefits and alternatives for the proposed anesthesia with the patient or authorized representative who has indicated his/her understanding and acceptance.   Dental advisory given  Plan Discussed with: CRNA and Anesthesiologist  Anesthesia Plan Comments: (Refuses blood products)       Anesthesia Quick Evaluation

## 2016-01-15 NOTE — Consult Note (Signed)
Consultation Note   [name] NGE:[id number] DOB: [birth year] DOA: 01/15/2016  Requesting MD/NP/PA: Barry Dienes / Gen. Surgery PCP: [name], MD  Patient coming from/Resides with: Private residence  Reason for consultation: Assist with management of multiple medical problems postoperatively  HPI: [name] is a 82 y.o. female with medical history significant for iron deficiency anemia secondary to gastric cancer with chronically bleeding polypoid lesion of the stomach, this is been complicated by patient's Jehovah witness status and refusion to accept blood products, primary modality of treatment has been with iron infusions, also age or fibrillation previously on anticoagulation but recently held due to ongoing gastric bleeding, stage III chronic kidney disease, hypertension, moderate or stenosis, hypothyroidism and protein calorie nutrition. Patient was admitted to undergo elective open subtotal gastrectomy and immediately postop the surgeon has requested medical consultation to assist with management of her chronic medical problems.    Review of Systems:  ** Unable to obtain from patient since she has been evaluated in the PACU in the immediate postop period and remains heavily sedated-history obtained from surgeon and from EMR   Past Medical History  Diagnosis Date  . PAF (paroxysmal atrial fibrillation) ([place])     controlled with amiodarone, on coumadin  . CAD (coronary artery disease)   . Aortic root dilatation ([place])   . Moderate aortic stenosis   . Chronic anticoagulation   . Tachycardia-bradycardia syndrome ([place])     s/p PPM by Dr Doreatha Lew (MDT) 07/03/10  . GERD (gastroesophageal reflux disease)   . Pacemaker     2012  . Neuropathy ([place])     feet/legs  . Restless leg syndrome   . Headache(784.0)     hx migraines - takes Zoloft for migraines  . History of uterine cancer 1980s    treated with hysterectomy, external radiation and radiation seed implants.     . Cancer ([place])     hx Kidney cancer / hx of endometrial cancer  . Anxiety   . Heart murmur   . HTN (hypertension)   . Hyperlipemia   . Hypothyroidism   . Osteomyelitis ([place]) as child  . Family history of anesthesia complication     daughter has ponv  . Syncope 05/28/2014  . Hepatic steatosis   . Elevated LFTs   . Gastroparesis   . Anal fissure   . Radiation proctitis   . Gastric polyp     adenomatous  . Neuromuscular disorder ([facility])     neuropathy in right leg  . Adenocarcinoma of stomach ([place]) 11/11/2015  . PONV (postoperative nausea and vomiting)     30 yrs ago since then no problem  . CHF (congestive heart failure) ([place])   . Pneumonia     hx  . Chronic kidney disease     only has one kidney rt lft rem 03 ca  . 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 Anemia     hx 3/17 iron infusion  . Stroke [facility]) 17    partial stroke left legally blind    Past Surgical History  Procedure Laterality Date  . Cardiac catheterization  2008  . Nephrectomy Left     renal cell cancer  . Patent ductus arterious repair  age 89  . Ptvp    . Pacemaker insertion  07/04/11    by Dr Doreatha Lew  . Cholecystectomy  1970s  . Upper gastrointestinal endoscopy    . Total knee arthroplasty  02/07/2012    Procedure: TOTAL KNEE ARTHROPLASTY;  Surgeon: Mauri Pole,  MD;  Location: [facility];  Service: Orthopedics;  Laterality: Right;  . Insert / replace / remove pacemaker    . Total hip revision Right 2009    initial replacment surg  . I&d knee with poly exchange Right 12/17/2013    Procedure: IRRIGATION AND DEBRIDEMEN RIGHT TOTAL  KNEE WITH POLY EXCHANGE;  Surgeon: Mauri Pole, MD;  Location: [facility];  Service: Orthopedics;  Laterality: Right;  . Patellar tendon repair Right 03/06/2014    Procedure: AVULSION WITH PATELLA TENDON REPAIR;  Surgeon: Mauri Pole, MD;  Location: [facility];  Service: Orthopedics;  Laterality: Right;  . Total knee revision Right 07/29/2014    Procedure: RIGHT KNEE REVISION OF PREVIOUS REPAIR  EXTENSOR MECHANISM;  Surgeon: Mauri Pole, MD;  Location: [facility];  Service: Orthopedics;  Laterality: Right;  . Colonoscopy N/A 11/11/2015    Procedure: COLONOSCOPY;  Surgeon: [name], MD;  Location: [facility];  Service: Endoscopy;  Laterality: N/A;  . Esophagogastroduodenoscopy N/A 11/11/2015    Procedure: ESOPHAGOGASTRODUODENOSCOPY (EGD);  Surgeon: [name], MD;  Location: [facility];  Service: Endoscopy;  Laterality: N/A;  . Joint replacement    . Total abdominal hysterectomy  87  . Atril fib       reports that she has never smoked. She has never used smokeless tobacco. She reports that she does not drink alcohol or use illicit drugs.  Mobility: During recent admission in March patient visualized and a rolling walker and home health PT was recommended at that time Work history: Did not obtain   Allergies  Allergen Reactions  . Amiodarone      Thyroid and liver and kidney problems  . Codeine Nausea And Vomiting  . Gabapentin Itching  . Hydrocodone Other (See Comments)    hallucination  . Morphine And Related Nausea And Vomiting  . Requip [Ropinirole Hcl]     Headache   . Zinc     nausea    Family History  Problem Relation Age of Onset  . Colon cancer Mother 6  . Heart disease Mother   . Heart disease Father   . Heart attack Father   . Heart disease Maternal Grandmother      Prior to Admission medications   Medication Sig Start Date End Date Taking? Authorizing Provider  ALPRAZolam (XANAX) 0.5 MG tablet TAKE 1/2-1 TABLET BY MOUTH 3 TIMES A DAY AS NEEDED FOR ANXIETY 01/01/16  Yes Courtney Forcucci, PA-C  atenolol (TENORMIN) 100 MG tablet Take 1 tablet (100 mg total) by mouth 2 (two) times daily. 05/19/15  Yes Darlin Coco, MD  B Complex-C (B-COMPLEX WITH VITAMIN C) tablet Take 1 tablet by mouth daily. 12/11/15  Yes [name], MD  Biotin 5000 MCG CAPS Take 10,000 mcg by mouth every morning.    Yes Historical Provider, MD  cetirizine (ZYRTEC) 10  MG tablet Take 10 mg by mouth at bedtime.   Yes Historical Provider, MD  Cholecalciferol (VITAMIN D PO) Take 5,000 Units by mouth every morning.    Yes Historical Provider, MD  Cyanocobalamin 1000 MCG/ML KIT Inject 1,000 mcg into the skin as directed. Patient taking differently: Inject 1,000 mcg into the skin every 30 (thirty) days.  11/24/15  Yes Brunetta Genera, MD  furosemide (LASIX) 40 MG tablet TAKE 1 TABLET (40 MG TOTAL) BY MOUTH DAILY. Patient taking differently: TAKE 1/2 TABLET (20 MG TOTAL) BY MOUTH DAILY. 12/11/15  Yes [name], MD  guaiFENesin (MUCINEX) 600 MG 12 hr tablet Take 1,200  mg by mouth 2 (two) times daily as needed for cough or to loosen phlegm.    Yes Historical Provider, MD  levothyroxine (SYNTHROID, LEVOTHROID) 100 MCG tablet TAKE 1 TABLET BY MOUTH EVERY DAY ON EMPTY STOMACH WITH A FULL GLASS OF WATER Patient taking differently: TAKE 1/2 TABLET (50 MCG TOTAL) BY MOUTH EVERY DAY ON EMPTY STOMACH WITH A FULL GLASS OF WATER 10/25/15  Yes [name], MD  losartan (COZAAR) 50 MG tablet Take 1 tablet (50 mg total) by mouth daily. 11/26/15  Yes Thompson Grayer, MD  LYRICA 75 MG capsule TAKE ONE CAPSULE BY MOUTH TWICE A DAY 11/29/15  Yes [name], MD  pantoprazole (PROTONIX) 40 MG tablet TAKE 1 TABLET (40 MG TOTAL) BY MOUTH 2 (TWO) TIMES DAILY. 12/11/15  Yes [name], MD  potassium chloride SA (K-DUR,KLOR-CON) 20 MEQ tablet Take 20 mEq by mouth 2 (two) times daily.   Yes Historical Provider, MD  sertraline (ZOLOFT) 50 MG tablet Take 1 tablet (50 mg total) by mouth daily. For mood 09/23/15  Yes [name], MD  sucralfate (CARAFATE) 1 g tablet TAKE 1 TABLET (1 G TOTAL) BY MOUTH 4 (FOUR) TIMES DAILY - WITH MEALS AND AT BEDTIME. 12/11/15  Yes [name], MD  nitroGLYCERIN (NITROSTAT) 0.4 MG SL tablet Place 1 tablet (0.4 mg total) under the tongue every 5 (five) minutes as needed for chest pain (MAX 3 TABLETS). 05/19/15   Darlin Coco, MD  traMADol (ULTRAM) 50  MG tablet Take 50 mg by mouth every 6 (six) hours as needed (pain). Reported on 10/14/2015    Historical Provider, MD  triamcinolone cream (KENALOG) 0.1 % APPLY TO AFFECTED AREA TWICE A DAY 04/09/15   [name], MD    Physical Exam: Filed Vitals:   01/15/16 1605 01/15/16 1617 01/15/16 1626 01/15/16 1629  BP: 164/103  161/95   Pulse: 105   94  Temp: 98.5 F (36.9 C)     TempSrc:      Resp: '14 13  18  '$ Height:      Weight:      SpO2: 100% 100%  98%      Constitutional: Does not appear to be in any acute distress noting patient heavily sedated post recent surgery Eyes: PERRL, lids and conjunctivae normal ENMT: Mucous membranes are moist. Posterior pharynx clear of any exudate or lesions.Normal dentition.  Neck: normal, supple, no masses, no thyromegaly Respiratory: clear to auscultation bilaterally, no wheezing, no crackles. Normal respiratory effort. No accessory muscle use.  Cardiovascular: Regular rate and rhythm, no rubs / gallops. Systolic murmur left sternal border fourth intercostal space grade 2/6 No extremity edema. 2+ pedal pulses. No carotid bruits.  Abdomen: no tenderness, no masses palpated. No hepatosplenomegaly. Bowel sounds absent. NG tube in place; midline incision clean dry and intact; On-Q device in place with tubing to lower abdomen Musculoskeletal: no clubbing / cyanosis. No joint deformity upper and lower extremities. Good ROM, no contractures. Normal muscle tone.  Skin: no rashes, lesions, ulcers. No induration Neurologic: Exam limited secondary to recent surgical procedure and sedation; patient did awaken briefly for PACU staff Psychiatric: Unable to accurately assess in setting of recent surgical procedure and sedation    Labs on Admission: I have personally reviewed following labs and imaging studies  CBC: No results for input(s): WBC, NEUTROABS, HGB, HCT, MCV, PLT in the last 168 hours. Basic Metabolic Panel: No results for input(s): NA, K, CL, CO2,  GLUCOSE, BUN, CREATININE, CALCIUM, MG, PHOS in the last 168 hours.  GFR: Estimated Creatinine Clearance: 36.2 mL/min (by C-G formula based on Cr of 1.09). Liver Function Tests: No results for input(s): AST, ALT, ALKPHOS, BILITOT, PROT, ALBUMIN in the last 168 hours. No results for input(s): LIPASE, AMYLASE in the last 168 hours. No results for input(s): AMMONIA in the last 168 hours. Coagulation Profile: No results for input(s): INR, PROTIME in the last 168 hours. Cardiac Enzymes: No results for input(s): CKTOTAL, CKMB, CKMBINDEX, TROPONINI in the last 168 hours. BNP (last 3 results) No results for input(s): PROBNP in the last 8760 hours. HbA1C: No results for input(s): HGBA1C in the last 72 hours. CBG: No results for input(s): GLUCAP in the last 168 hours. Lipid Profile: No results for input(s): CHOL, HDL, LDLCALC, TRIG, CHOLHDL, LDLDIRECT in the last 72 hours. Thyroid Function Tests: No results for input(s): TSH, T4TOTAL, FREET4, T3FREE, THYROIDAB in the last 72 hours. Anemia Panel: No results for input(s): VITAMINB12, FOLATE, FERRITIN, TIBC, IRON, RETICCTPCT in the last 72 hours. Urine analysis:    Component Value Date/Time   COLORURINE YELLOW 01/07/2016 1145   APPEARANCEUR CLEAR 01/07/2016 1145   LABSPEC 1.014 01/07/2016 1145   PHURINE 5.5 01/07/2016 1145   GLUCOSEU NEGATIVE 01/07/2016 1145   HGBUR NEGATIVE 01/07/2016 1145   BILIRUBINUR NEGATIVE 01/07/2016 1145   KETONESUR NEGATIVE 01/07/2016 1145   PROTEINUR NEGATIVE 01/07/2016 1145   UROBILINOGEN 0.2 07/22/2014 1120   NITRITE NEGATIVE 01/07/2016 1145   LEUKOCYTESUR NEGATIVE 01/07/2016 1145   Sepsis Labs: '@LABRCNTIP'$ (procalcitonin:4,lacticidven:4) )No results found for this or any previous visit (from the past 240 hour(s)).   Radiological Exams on Admission: No results found.    Assessment/Plan Principal Problem:   Adenocarcinoma of stomach /Status post open subtotal gastrectomy -Management of this problem per  surgical team -Patient does have NG tube in place and likely will not be able to ingest oral medications for multiple days  Active Problems:   Chronic atrial fibrillation -Patient on rate control agent with beta blocker prior to [place] NG tube in place we'll give scheduled IV Lopressor for now -Had previously been on warfarin for anticoagulation but with recent continued slow oozing/bleeding from cancerous polypoid tumor this medication had been placed on hold -Surgery plans to begin initially with Lovenox for DVT prophylaxis and if tolerates without overt bleeding within like to transition to IV heparin without bolus and then once patient is able to tolerate by PO's begin warfarin -CHADVASc = 5    Refusal of blood transfusions (Jehovah's Witness)/Iron deficiency anemia due to chronic blood loss -Chronic blood loss was related to gastric carcinoma which has now been removed -Previously treated with iron transfusions with most recent iron level 14 in March prior to administration of IV iron -Repeat anemia panel and administer IV iron if indicated -Preoperative hemoglobin stable and near baseline of around 12.5 -EBL was 200 mL per op note    Essential hypertension -Moderate control and likely been influenced by recent surgical procedure -On Tenormin and Cozaar at home -NPO so no oral medications but have ordered scheduled IV Lopressor -Lasix on hold for now -Echocardiogram in March 2017 with preserved LV function and mild concentric hypertrophy but study was not technically sufficient to allow for evaluation of LV diastolic dysfunction in setting of atrial fibrillation    CKD (chronic kidney disease), stage III -Renal function stable and at baseline of 22/1.09    Hypothyroidism -Continue Synthroid but convert to IV route (1/2 of PO dose)    Aortic stenosis, moderate -Especially in the immediate postoperative period avoid  rapidly repositioning from supine to seated to standing to  minimize risk of syncope    Protein-calorie malnutrition, moderate  -Once NG tube out will need nutrition consultation -If surgery expects prolonged bowel rest/NGT may need to consider short-term parenteral nutrition    Peripheral neuropathy -On Lyrica prior to admission       DVT prophylaxis: Surgery plans to begin Lovenox on 5/12  Code Status: Full code Family Communication: No family available during initial consultation of patient in PACU Disposition Plan: At discretion of admitting surgical team     ELLIS,ALLISON L. ANP-BC [facility] Pager [phone]   If 7PM-7AM, please contact night-coverage [email or web address] Password [facility]  01/15/2016, 4:38 PM

## 2016-01-15 NOTE — Progress Notes (Signed)
Care of pt assumed by MA Ishmail Mcmanamon RN 

## 2016-01-16 ENCOUNTER — Encounter (HOSPITAL_COMMUNITY): Payer: Self-pay | Admitting: General Surgery

## 2016-01-16 LAB — CBC
HEMATOCRIT: 35.8 % — AB (ref 36.0–46.0)
HEMOGLOBIN: 11.3 g/dL — AB (ref 12.0–15.0)
MCH: 28 pg (ref 26.0–34.0)
MCHC: 31.6 g/dL (ref 30.0–36.0)
MCV: 88.6 fL (ref 78.0–100.0)
Platelets: 166 10*3/uL (ref 150–400)
RBC: 4.04 MIL/uL (ref 3.87–5.11)
RDW: 17.9 % — ABNORMAL HIGH (ref 11.5–15.5)
WBC: 10.8 10*3/uL — ABNORMAL HIGH (ref 4.0–10.5)

## 2016-01-16 LAB — MAGNESIUM: Magnesium: 1.4 mg/dL — ABNORMAL LOW (ref 1.7–2.4)

## 2016-01-16 LAB — BASIC METABOLIC PANEL
Anion gap: 12 (ref 5–15)
BUN: 17 mg/dL (ref 6–20)
CHLORIDE: 111 mmol/L (ref 101–111)
CO2: 18 mmol/L — AB (ref 22–32)
CREATININE: 1.03 mg/dL — AB (ref 0.44–1.00)
Calcium: 8.1 mg/dL — ABNORMAL LOW (ref 8.9–10.3)
GFR calc non Af Amer: 50 mL/min — ABNORMAL LOW (ref >60)
GFR, EST AFRICAN AMERICAN: 58 mL/min — AB (ref >60)
Glucose, Bld: 176 mg/dL — ABNORMAL HIGH (ref 65–99)
Potassium: 4.3 mmol/L (ref 3.5–5.1)
Sodium: 141 mmol/L (ref 135–145)

## 2016-01-16 LAB — PHOSPHORUS: PHOSPHORUS: 5 mg/dL — AB (ref 2.5–4.6)

## 2016-01-16 MED ORDER — MAGNESIUM SULFATE 2 GM/50ML IV SOLN
2.0000 g | Freq: Once | INTRAVENOUS | Status: AC
  Administered 2016-01-16: 2 g via INTRAVENOUS
  Filled 2016-01-16: qty 50

## 2016-01-16 MED ORDER — METOPROLOL TARTRATE 5 MG/5ML IV SOLN
10.0000 mg | Freq: Four times a day (QID) | INTRAVENOUS | Status: DC
  Administered 2016-01-16 – 2016-01-17 (×4): 10 mg via INTRAVENOUS
  Filled 2016-01-16 (×5): qty 10

## 2016-01-16 NOTE — Consult Note (Signed)
[facility] TEAM 1 - Stepdown/ICU TEAM CONSULT F/U NOTE  [name] [id number] DOB: 1934/08/15 DOA: 01/15/2016 PCP: [name], MD  Admit HPI / Brief Narrative: 82 y.o. female with history of CAD, tachy-brady s/p pacer, Afib, Stage 3 CKD, HTN, moderate AoS, hypothyroidism, and iron deficiency anemia secondary to gastric cancer with a chronically bleeding polypoid lesion of the stomach complicated by patient's religious beliefs (Jehovah witness) such that the primary modality of treatment has been iron infusions.  She was admitted to the Gen Surgery service to undergo elective open subtotal gastrectomy.  Postop the surgeon requested medical consultation to assist with management of her chronic medical issues.  HPI/Subjective: The pt is in good spirits and reports only modest abdom soreness.  She denies cp, n/v, or sob.    Recommendations/Plan:  Adenocarcinoma of stomach s/p open subtotal gastrectomy -per primary team (Gen Surgery)  Chronic atrial fibrillation -CHADVASc 5 - rate presently uncontrolled - adjust medical tx and follow - anticoag at discretion of Gen Surgery   Iron deficiency anemia due to chronic blood loss - refuses transfusion  -related to gastric carcinoma which has now been resected -Hgb presently stable - Fe at goal   Recent Labs Lab 01/16/16 0330  HGB 11.3*    Essential hypertension -BP reasonably controlled at present  -TTE March 2017 with preserved LV function and mild concentric hypertrophy but study was not technically sufficient to allow for evaluation of LV diastolic dysfunction in setting of atrial fibrillation  Chronic kidney disease stage III -Renal function stable and at baseline of 1.09  Recent Labs Lab 01/16/16 0330  CREATININE 1.03*    Hypothyroidism -Continue Synthroid   Aortic stenosis, moderate  Protein-calorie malnutrition, moderate  -nutrition per primary team   Hypomagnesemia -replace and follow   Code  Status: FULL Family Communication: no family present at time of exam  Antibiotics: none  DVT prophylaxis: lovenox   Objective: Blood pressure 112/65, pulse 105, temperature 98 F (36.7 C), temperature source Oral, resp. rate 12, height 5\' 1"  (1.549 m), weight 65.318 kg (144 lb), SpO2 99 %.  Intake/Output Summary (Last 24 hours) at 01/16/16 0923 Last data filed at 01/16/16 0830  Gross per 24 hour  Intake   4010 ml  Output   1335 ml  Net   2675 ml    Exam: General: No acute respiratory distress Lungs: Clear to auscultation bilaterally without wheezes or crackles Cardiovascular: Regular rate and rhythm without murmur gallop or rub normal S1 and S2 Abdomen: Nondistended, soft, no rebound, no appreciable mass Extremities: No significant cyanosis, clubbing, or edema bilateral lower extremities  Data Reviewed: Basic Metabolic Panel:  Recent Labs Lab 01/16/16 0330  NA 141  K 4.3  CL 111  CO2 18*  GLUCOSE 176*  BUN 17  CREATININE 1.03*  CALCIUM 8.1*  MG 1.4*  PHOS 5.0*    CBC:  Recent Labs Lab 01/16/16 0330  WBC 10.8*  HGB 11.3*  HCT 35.8*  MCV 88.6  PLT 166    Liver Function Tests: No results for input(s): AST, ALT, ALKPHOS, BILITOT, PROT, ALBUMIN in the last 168 hours. No results for input(s): LIPASE, AMYLASE in the last 168 hours. No results for input(s): AMMONIA in the last 168 hours.  Coags: No results for input(s): INR in the last 168 hours.  Invalid input(s): PT No results for input(s): APTT in the last 168 hours.  Cardiac Enzymes: No results for input(s): CKTOTAL, CKMB, CKMBINDEX, TROPONINI in the last 168 hours.  CBG:  Recent Labs Lab 01/15/16 2009  GLUCAP 183*    Recent Results (from the past 240 hour(s))  MRSA PCR Screening     Status: None   Collection Time: 01/15/16  7:30 PM  Result Value Ref Range Status   MRSA by PCR NEGATIVE NEGATIVE Final    Comment:        The GeneXpert MRSA Assay (FDA approved for NASAL specimens only),  is one component of a comprehensive MRSA colonization surveillance program. It is not intended to diagnose MRSA infection nor to guide or monitor treatment for MRSA infections.      Studies:   Recent x-ray studies have been reviewed in detail by the Attending Physician  Scheduled Meds:  Scheduled Meds: . acetaminophen  1,000 mg Intravenous Q6H  . bupivacaine 0.25 % ON-Q pump DUAL CATH 300 mL  300 mL Other To OR  . fentaNYL   Intravenous Q4H  . levothyroxine  50 mcg Intravenous Daily  . metoprolol  5 mg Intravenous Q6H  . pantoprazole (PROTONIX) IV  40 mg Intravenous QHS    Time spent on care of this patient: 35 mins   Kiffany Schelling T , MD   [facility] Office  [phone] Pager - Text Page per Shea Evans as per below:  On-Call/Text Page:      [email or web address]      password [email or web address]  If 7PM-7AM, please contact night-coverage [email or web address] Password [email or web address] 01/16/2016, 9:23 AM   LOS: 1 day

## 2016-01-16 NOTE — Care Management Note (Signed)
Case Management Note  Patient Details  Name: [name] MRN: [id number] Date of Birth: 1934-05-02  Subjective/Objective:      Pt admitted s/p laparoscopy - open subtotal gastrectomy              Action/Plan:  Pt is independent from home; uses cane for ambulation.  Pt will discharge home with daughter.  CM will continue to monitor for discharge needs   Expected Discharge Date:                  Expected Discharge Plan:  Home/Self Care  In-House Referral:     Discharge planning Services  CM Consult  Post Acute Care Choice:    Choice offered to:     DME Arranged:    DME Agency:     HH Arranged:    HH Agency:     Status of Service:  In process, will continue to follow  Medicare Important Message Given:    Date Medicare IM Given:    Medicare IM give by:    Date Additional Medicare IM Given:    Additional Medicare Important Message give by:     If discussed at [name] of Stay Meetings, dates discussed:    Additional Comments:  [name], RN 01/16/2016, 10:15 AM

## 2016-01-16 NOTE — Progress Notes (Signed)
1 Day Post-Op  Subjective: Good pain control. Tachycardic this AM.  Afebrile.  No n/v.    Objective: Vital signs in last 24 hours: Temp:  [97 F (36.1 C)-98.5 F (36.9 C)] 98 F (36.7 C) (05/12 0805) Pulse Rate:  [75-120] 112 (05/12 0700) Resp:  [0-22] 22 (05/12 0400) BP: (111-164)/(67-113) 112/72 mmHg (05/12 0700) SpO2:  [97 %-100 %] 99 % (05/12 0700) Arterial Line BP: (96-186)/(61-137) 147/70 mmHg (05/12 0700) Weight:  [65.318 kg (144 lb)] 65.318 kg (144 lb) (05/11 1016) Last BM Date: 01/15/16  Intake/Output from previous day: 05/11 0701 - 05/12 0700 In: 3860 [I.V.:3560; IV Piggyback:300] Out: 1240 [Urine:950; Emesis/NG output:140; Blood:150] Intake/Output this shift:    General appearance: alert, cooperative and no distress Resp: breathing comfortably Cardio: tachy, atrial fibrillation GI: soft, non distended, approp tender. d ressing d/c/i.   Lab Results:   Recent Labs  01/16/16 0330  WBC 10.8*  HGB 11.3*  HCT 35.8*  PLT 166   BMET  Recent Labs  01/16/16 0330  NA 141  K 4.3  CL 111  CO2 18*  GLUCOSE 176*  BUN 17  CREATININE 1.03*  CALCIUM 8.1*   PT/INR No results for input(s): LABPROT, INR in the last 72 hours. ABG No results for input(s): PHART, HCO3 in the last 72 hours.  Invalid input(s): PCO2, PO2  Studies/Results: No results found.  Anti-infectives: Anti-infectives    Start     Dose/Rate Route Frequency Ordered Stop   01/15/16 2200  ceFAZolin (ANCEF) IVPB 2g/100 mL premix     2 g 200 mL/hr over 30 Minutes Intravenous Every 8 hours 01/15/16 1932 01/15/16 2239   01/15/16 1021  ceFAZolin (ANCEF) 2-4 GM/100ML-% IVPB    Comments:  Nyoka Cowden   : cabinet override      01/15/16 1021 01/15/16 2229   01/15/16 1010  ceFAZolin (ANCEF) IVPB 2g/100 mL premix     2 g 200 mL/hr over 30 Minutes Intravenous On call to O.R. 01/15/16 1010 01/15/16 1335      Assessment/Plan: s/p Procedure(s): LAPAROSCOPY DIAGNOSTIC (N/A) OPEN PARTIAL  GASTRECTOMY (N/A) Continue foley due to strict I&O Pulmonary toilet  [facility] to assist with HTN, atrial fibrillation Leave in stepdown today. Clears OK.     LOS: 1 day    [facility] 01/16/2016

## 2016-01-17 LAB — BASIC METABOLIC PANEL
Anion gap: 9 (ref 5–15)
BUN: 13 mg/dL (ref 6–20)
CHLORIDE: 111 mmol/L (ref 101–111)
CO2: 20 mmol/L — ABNORMAL LOW (ref 22–32)
Calcium: 8.6 mg/dL — ABNORMAL LOW (ref 8.9–10.3)
Creatinine, Ser: 0.87 mg/dL (ref 0.44–1.00)
GFR calc Af Amer: >60 mL/min (ref >60)
GLUCOSE: 132 mg/dL — AB (ref 65–99)
POTASSIUM: 4.6 mmol/L (ref 3.5–5.1)
Sodium: 140 mmol/L (ref 135–145)

## 2016-01-17 LAB — CBC
HCT: 33.7 % — ABNORMAL LOW (ref 36.0–46.0)
Hemoglobin: 10.6 g/dL — ABNORMAL LOW (ref 12.0–15.0)
MCH: 28.4 pg (ref 26.0–34.0)
MCHC: 31.5 g/dL (ref 30.0–36.0)
MCV: 90.3 fL (ref 78.0–100.0)
PLATELETS: 173 10*3/uL (ref 150–400)
RBC: 3.73 MIL/uL — AB (ref 3.87–5.11)
RDW: 18.5 % — ABNORMAL HIGH (ref 11.5–15.5)
WBC: 11.9 10*3/uL — ABNORMAL HIGH (ref 4.0–10.5)

## 2016-01-17 MED ORDER — DEXTROSE-NACL 5-0.45 % IV SOLN
INTRAVENOUS | Status: DC
  Administered 2016-01-17 – 2016-01-19 (×4): via INTRAVENOUS

## 2016-01-17 MED ORDER — ACETAMINOPHEN 10 MG/ML IV SOLN
1000.0000 mg | Freq: Four times a day (QID) | INTRAVENOUS | Status: AC
  Administered 2016-01-17 – 2016-01-18 (×4): 1000 mg via INTRAVENOUS
  Filled 2016-01-17 (×7): qty 100

## 2016-01-17 MED ORDER — HEPARIN SODIUM (PORCINE) 5000 UNIT/ML IJ SOLN
5000.0000 [IU] | Freq: Three times a day (TID) | INTRAMUSCULAR | Status: DC
  Administered 2016-01-17 – 2016-01-23 (×18): 5000 [IU] via SUBCUTANEOUS
  Filled 2016-01-17 (×19): qty 1

## 2016-01-17 MED ORDER — METOPROLOL TARTRATE 5 MG/5ML IV SOLN
10.0000 mg | INTRAVENOUS | Status: DC
  Administered 2016-01-17 – 2016-01-19 (×11): 10 mg via INTRAVENOUS
  Filled 2016-01-17 (×13): qty 10

## 2016-01-17 MED ORDER — LEVOTHYROXINE SODIUM 100 MCG IV SOLR: 25.0000 ug | Freq: Every day | INTRAVENOUS | Status: DC

## 2016-01-17 MED ORDER — LEVOTHYROXINE SODIUM 100 MCG IV SOLR
25.0000 ug | Freq: Every day | INTRAVENOUS | Status: DC
  Administered 2016-01-19: 25 ug via INTRAVENOUS
  Filled 2016-01-17 (×2): qty 5

## 2016-01-17 NOTE — Consult Note (Signed)
[facility] TEAM 1 - Stepdown/ICU TEAM CONSULT F/U NOTE  [name] [id number] DOB: 12/31/1933 DOA: 01/15/2016 PCP: [name], MD  Admit HPI / Brief Narrative: 82 y.o. female with history of CAD, tachy-brady s/p pacer, Afib, Stage 3 CKD, HTN, moderate AoS, hypothyroidism, and iron deficiency anemia secondary to gastric cancer with a chronically bleeding polypoid lesion of the stomach complicated by patient's religious beliefs (Jehovah witness) such that the primary modality of treatment has been iron infusions.  She was admitted to the Gen Surgery service to undergo elective open subtotal gastrectomy.  Postop the surgeon requested medical consultation to assist with management of her chronic medical issues.  HPI/Subjective: Pt c/o a "gassy feeling" in her epigastrium, with some nausea.  She denies SSCP, vomiting, or signif sob.  She is in good spirits and very pleasant.    Recommendations/Plan:  Adenocarcinoma of stomach s/p open subtotal gastrectomy -per primary team (Gen Surgery)  Chronic atrial fibrillation -CHADVASc 5 - rate not well controlled at present - adjust medical tx again and follow - anticoag at discretion of Gen Surgery   Iron deficiency anemia due to chronic blood loss - refuses transfusion  -related to gastric carcinoma which has now been resected -Hgb trending down - Gen Surg is following - agree w/ holding off on full anticoag for now   Recent Labs Lab 01/16/16 0330 01/17/16 0510  HGB 11.3* 10.6*    Essential hypertension -BP remains poorly controlled  -TTE March 2017 with preserved LV function and mild concentric hypertrophy but study was not technically sufficient to evaluate LV diastolic dysfunction  Chronic kidney disease stage III -Renal function stable   Recent Labs Lab 01/16/16 0330 01/17/16 0510  CREATININE 1.03* 0.87    Hypothyroidism -despite what her office records report, pt tells me she has been on 38mcg of synthroid  "for a real long time" - will therefore decrease her IV dose to 25mcg - TSH 0.851 (at goal) 11/07/2015  Aortic stenosis, moderate Not hemodynamically significant at present  Protein-calorie malnutrition, moderate  -nutrition per primary team   Hypomagnesemia -replace and follow   Code Status: FULL Family Communication: no family present at time of exam  Antibiotics: none  DVT prophylaxis: SQ heparin   Objective: Blood pressure 152/90, pulse 104, temperature 98 F (36.7 C), temperature source Oral, resp. rate 16, height 5\' 1"  (1.549 m), weight 65.318 kg (144 lb), SpO2 99 %.  Intake/Output Summary (Last 24 hours) at 01/17/16 1009 Last data filed at 01/17/16 0600  Gross per 24 hour  Intake   1475 ml  Output   1540 ml  Net    -65 ml    Exam: General: No acute respiratory distress in bed  Lungs: Clear to auscultation bilaterally - no wheeze or crackles Cardiovascular: tachycardic- 2/6 holosystolic M - no gallup or rub  Abdomen: Nondistended, soft, no rebound, no appreciable mass Extremities: No significant cyanosis, clubbing, edema bilateral lower extremities  Data Reviewed: Basic Metabolic Panel:  Recent Labs Lab 01/16/16 0330 01/17/16 0510  NA 141 140  K 4.3 4.6  CL 111 111  CO2 18* 20*  GLUCOSE 176* 132*  BUN 17 13  CREATININE 1.03* 0.87  CALCIUM 8.1* 8.6*  MG 1.4*  --   PHOS 5.0*  --     CBC:  Recent Labs Lab 01/16/16 0330 01/17/16 0510  WBC 10.8* 11.9*  HGB 11.3* 10.6*  HCT 35.8* 33.7*  MCV 88.6 90.3  PLT 166 173    Liver Function Tests: No  results for input(s): AST, ALT, ALKPHOS, BILITOT, PROT, ALBUMIN in the last 168 hours. No results for input(s): LIPASE, AMYLASE in the last 168 hours. No results for input(s): AMMONIA in the last 168 hours.  Coags: No results for input(s): INR in the last 168 hours.  Invalid input(s): PT No results for input(s): APTT in the last 168 hours.   CBG:  Recent Labs Lab 01/15/16 2009  GLUCAP 183*     Recent Results (from the past 240 hour(s))  MRSA PCR Screening     Status: None   Collection Time: 01/15/16  7:30 PM  Result Value Ref Range Status   MRSA by PCR NEGATIVE NEGATIVE Final    Comment:        The GeneXpert MRSA Assay (FDA approved for NASAL specimens only), is one component of a comprehensive MRSA colonization surveillance program. It is not intended to diagnose MRSA infection nor to guide or monitor treatment for MRSA infections.      Studies:   Recent x-ray studies have been reviewed in detail by the Attending Physician  Scheduled Meds:  Scheduled Meds: . heparin subcutaneous  5,000 Units Subcutaneous Q8H  . levothyroxine  50 mcg Intravenous Daily  . metoprolol  10 mg Intravenous Q6H  . pantoprazole (PROTONIX) IV  40 mg Intravenous QHS    Time spent on care of this patient: 35 mins   Gladis Soley T , MD   [facility] Office  [phone] Pager - Text Page per Shea Evans as per below:  On-Call/Text Page:      [email or web address]      password [email or web address]  If 7PM-7AM, please contact night-coverage [email or web address] Password [email or web address] 01/17/2016, 10:09 AM   LOS: 2 days

## 2016-01-17 NOTE — Progress Notes (Signed)
10 cc fentanyl wasted down sink from PCA syringe, witnessed by Ellard Artis, RN and Tiney Rouge RN.

## 2016-01-17 NOTE — Progress Notes (Signed)
2 Days Post-Op  Subjective:  POD 2 s/p ex lap and partial gastrectomy.   NAE overnight.  Continues to have mild tachycardia despite increase in metoprolol yesterday. Denies any bowel function.  Objective: Vital signs in last 24 hours: Temp:  [97.7 F (36.5 C)-98.9 F (37.2 C)] 98 F (36.7 C) (05/13 0740) Pulse Rate:  [91-122] 108 (05/13 0700) Resp:  [0-25] 24 (05/13 0700) BP: (72-159)/(48-119) 159/84 mmHg (05/13 0700) SpO2:  [98 %-100 %] 98 % (05/13 0700) Arterial Line BP: (83-170)/(65-89) 156/84 mmHg (05/13 0400) Last BM Date: 01/15/16  Intake/Output from previous day: 05/12 0701 - 05/13 0700 In: 1625 [I.V.:1575; IV Piggyback:50] Out: [id number] [Urine:1635] Intake/Output this shift:    General appearance: alert, cooperative, appears stated age, no distress and moderately obese Cardio: irregularly irregular rhythm GI: Soft, Non distended.  Appropriately tender to palpation.  On-Q pumps exiting lower abdomen.  NG with ligth bilious output. Incision/Wound: Dressing in place over midline with some shadowing. Lab Results:   Recent Labs  01/16/16 0330 01/17/16 0510  WBC 10.8* 11.9*  HGB 11.3* 10.6*  HCT 35.8* 33.7*  PLT 166 173   BMET  Recent Labs  01/16/16 0330 01/17/16 0510  NA 141 140  K 4.3 4.6  CL 111 111  CO2 18* 20*  GLUCOSE 176* 132*  BUN 17 13  CREATININE 1.03* 0.87  CALCIUM 8.1* 8.6*   PT/INR No results for input(s): LABPROT, INR in the last 72 hours. ABG No results for input(s): PHART, HCO3 in the last 72 hours.  Invalid input(s): PCO2, PO2  Studies/Results: No results found.  Anti-infectives: Anti-infectives    Start     Dose/Rate Route Frequency Ordered Stop   01/15/16 2200  ceFAZolin (ANCEF) IVPB 2g/100 mL premix     2 g 200 mL/hr over 30 Minutes Intravenous Every 8 hours 01/15/16 1932 01/15/16 2239   01/15/16 1021  ceFAZolin (ANCEF) 2-4 GM/100ML-% IVPB    Comments:  Nyoka Cowden   : cabinet override      01/15/16 1021 01/15/16 2229    01/15/16 1010  ceFAZolin (ANCEF) IVPB 2g/100 mL premix     2 g 200 mL/hr over 30 Minutes Intravenous On call to O.R. 01/15/16 1010 01/15/16 1335      Assessment/Plan: s/p Procedure(s): LAPAROSCOPY DIAGNOSTIC (N/A) OPEN PARTIAL GASTRECTOMY (N/A) POD 2 s/p ex lap and partial gastrectomy for gastric cancer.  -She endorses some nausea with pain medications.  Currently on fentanyl and Ofirmev.  Has contraindications for morphine and dilaudid.  Minimally using PCA (1 time in 8 hours per RN), but states that dose is working for pain.  Will DC PCA and transition to intermittent IV fentanyl.  Continue Ofirmev.  Given age and concern for potential bleeding, will hold on use of toradol -HR continues to be elevated with history of Afib.  [facility] assisting with management.  Will need tele when transferred to floor. -Hgb down from 11.3 to 10.6 today with history of Afib.  Currently off anticoagulation.  Will start prophylactic anticoag (hep 5k q8).  Hold on therapeutic anticoag given slight decrease and status as Jehovah's witness.  Will recheck hgb tomorrow AM.  If stable, will discuss starting full anticoagulation. -UOP good last 24 hours with improvement in Cr.  Will discontinue foley this morning. -Encourage ambulation.  Uses walker per patient, therefore, will consult PT for assistance with mobilization.   -Continue NPO status with NG.  Plan for UGI tomorrow to eval for potential leak.  Will re-eval NG after study.   -  Will plan for transfer to floor today with tele.  LOS: 2 days    Reino Kent 01/17/2016

## 2016-01-18 ENCOUNTER — Inpatient Hospital Stay (HOSPITAL_COMMUNITY): Payer: Medicare Other

## 2016-01-18 DIAGNOSIS — I350 Nonrheumatic aortic (valve) stenosis: Secondary | ICD-10-CM

## 2016-01-18 DIAGNOSIS — C169 Malignant neoplasm of stomach, unspecified: Secondary | ICD-10-CM

## 2016-01-18 DIAGNOSIS — I482 Chronic atrial fibrillation: Secondary | ICD-10-CM

## 2016-01-18 LAB — CBC
HEMATOCRIT: 33.4 % — AB (ref 36.0–46.0)
HEMOGLOBIN: 10.3 g/dL — AB (ref 12.0–15.0)
MCH: 27.9 pg (ref 26.0–34.0)
MCHC: 30.8 g/dL (ref 30.0–36.0)
MCV: 90.5 fL (ref 78.0–100.0)
Platelets: 171 10*3/uL (ref 150–400)
RBC: 3.69 MIL/uL — ABNORMAL LOW (ref 3.87–5.11)
RDW: 17.9 % — AB (ref 11.5–15.5)
WBC: 9.5 10*3/uL (ref 4.0–10.5)

## 2016-01-18 LAB — HEPATIC FUNCTION PANEL
ALK PHOS: 70 U/L (ref 38–126)
ALT: 17 U/L (ref 14–54)
AST: 25 U/L (ref 15–41)
Albumin: 2.5 g/dL — ABNORMAL LOW (ref 3.5–5.0)
BILIRUBIN INDIRECT: 0.7 mg/dL (ref 0.3–0.9)
Bilirubin, Direct: 0.2 mg/dL (ref 0.1–0.5)
Total Bilirubin: 0.9 mg/dL (ref 0.3–1.2)
Total Protein: 5.6 g/dL — ABNORMAL LOW (ref 6.5–8.1)

## 2016-01-18 LAB — MAGNESIUM: Magnesium: 1.7 mg/dL (ref 1.7–2.4)

## 2016-01-18 MED ORDER — DIATRIZOATE MEGLUMINE & SODIUM 66-10 % PO SOLN
ORAL | Status: AC
  Administered 2016-01-18: 60 mL via GASTROSTOMY
  Filled 2016-01-18: qty 90

## 2016-01-18 MED ORDER — ACETAMINOPHEN 10 MG/ML IV SOLN
1000.0000 mg | Freq: Four times a day (QID) | INTRAVENOUS | Status: AC
  Administered 2016-01-18 – 2016-01-19 (×3): 1000 mg via INTRAVENOUS
  Filled 2016-01-18 (×5): qty 100

## 2016-01-18 MED ORDER — DIATRIZOATE MEGLUMINE & SODIUM 66-10 % PO SOLN
90.0000 mL | Freq: Once | ORAL | Status: DC
  Filled 2016-01-18: qty 90

## 2016-01-18 NOTE — Progress Notes (Signed)
3 Days Post-Op  Subjective: Alert.  Pleasant.  Family in room. Heart rate ranges from 91-105. States she had 2 small bowel movements and is passing flatus. Good pain control with IV Tylenol and On-Q. Says she did not get out of bed yesterday, and that no one encouraged her to do that. Patient family declined Foley removal yesterday because she was not out of bed.  They agreed to remove Foley today. Started on low-dose heparin subcutaneous for DVT prophylaxis yesterday.  No labs today.  Objective: Vital signs in last 24 hours: Temp:  [97.8 F (36.6 C)-98.5 F (36.9 C)] 97.8 F (36.6 C) (05/14 0414) Pulse Rate:  [91-116] 105 (05/14 0414) Resp:  [7-24] 18 (05/14 0414) BP: (109-172)/(84-105) 154/94 mmHg (05/14 0414) SpO2:  [92 %-100 %] 96 % (05/14 0414) Weight:  [73.846 kg (162 lb 12.8 oz)] 73.846 kg (162 lb 12.8 oz) (05/13 1829) Last BM Date: 01/17/16  Intake/Output from previous day: 05/13 0701 - 05/14 0700 In: 678.8 [I.V.:678.8] Out: 960 [Urine:950; Emesis/NG output:10] Intake/Output this shift:    General appearance: Alert.  Cooperative.  Mental status reasonably normal.  No distress. Resp: clear to auscultation bilaterally Heart: Irregularly irregular. GI: Incision clean.  Honeycomb dressing in place.  Abdomen soft.  Hypoactive bowel sounds.  Minimally tender.  Lab Results:  No results found for this or any previous visit (from the past 24 hour(s)).   Studies/Results: No results found.  [name] acetaminophen  1,000 mg Intravenous Q6H  . heparin subcutaneous  5,000 Units Subcutaneous Q8H  . [START ON 01/19/2016] levothyroxine  25 mcg Intravenous Daily  . metoprolol  10 mg Intravenous Q4H  . pantoprazole (PROTONIX) IV  40 mg Intravenous QHS     Assessment/Plan: s/p Procedure(s): LAPAROSCOPY DIAGNOSTIC OPEN PARTIAL GASTRECTOMY  POD #3.  Open partial gastrectomy for gastric neoplasm Await pathology Water-soluble upper GI today.  If no leak or obstruction will remove NG  tube today and start clear liquids Needs to mobilize out of bed and ambulate.  Discussed with nursing staff. PT consult ordered.  Await evaluation DC Foley  VTE prophylaxis.  On subcutaneous heparin  Chronic atrial fibrillation on chronic Coumadin.  Will hold off on full anticoagulation due to her Jehovah's Witness status and until we see what her CBC shows tomorrow.  Possibly move to full anticoagulation tomorrow.  @PROBHOSP @  LOS: 3 days    Porshea Janowski M 01/18/2016  . .prob

## 2016-01-18 NOTE — Progress Notes (Signed)
[facility] TEAM 1 - Stepdown/ICU TEAM CONSULT F/U NOTE  WILLETT BUDZYNSKI [id number] DOB: February 22, 1934 DOA: 01/15/2016 PCP: [name], MD  Admit HPI / Brief Narrative: 82 y.o. female with history of CAD, tachy-brady s/p pacer, Afib, Stage 3 CKD, HTN, moderate AoS, hypothyroidism, and iron deficiency anemia secondary to gastric cancer with a chronically bleeding polypoid lesion of the stomach complicated by patient's religious beliefs (Jehovah witness) such that the primary modality of treatment has been iron infusions.  She was admitted to the Gen Surgery service to undergo elective open subtotal gastrectomy.  Postop the surgeon requested medical consultation to assist with management of her chronic medical issues.  HPI/Subjective: Still has NGT , had 2 BM yesterday  Recommendations/Plan:  Adenocarcinoma of stomach s/p open subtotal gastrectomy -per primary team (Gen Surgery)  Chronic atrial fibrillation -CHADVASc 5 - rate not well controlled at present on IV metoprolol, patient currentl -  General surgery to decide the timing of anticoagulation with Coumadin Check FOBT, if positive and hemoglobin still  Trending down, recommend holding anticoagulation until hemoglobin stabilizes, also pending NPO status  Iron deficiency anemia due to chronic blood loss - refuses transfusion  -related to gastric carcinoma which has now been resected -Hgb trending down - Gen Surg is following - agree w/ holding off on full anticoag for now  Agree with PPI  Recent Labs Lab 01/16/16 0330 01/17/16 0510  HGB 11.3* 10.6*    Essential hypertension -BP remains poorly controlled , patient takes atenolol, Lasix, Cozaar,at home -TTE March 2017 with preserved LV function and mild concentric hypertrophy but study was not technically sufficient to evaluate LV diastolic dysfunction  Chronic kidney disease stage III -Renal function stable   Recent Labs Lab 01/16/16 0330 01/17/16 0510  CREATININE  1.03* 0.87    Hypothyroidism Currently on IV dose to 65mcg - TSH 0.851 (at goal) 11/07/2015. Switch back to by mouth Synthroid  50 g when the patient starts  PO  intake  Aortic stenosis, moderate Not hemodynamically significant at present  Protein-calorie malnutrition, moderate  -nutrition per primary team   Hypomagnesemia -replace and follow    Code Status: FULL Family Communication: no family present at time of exam  Antibiotics: none  DVT prophylaxis: SQ heparin   Objective: Blood pressure 154/94, pulse 105, temperature 97.8 F (36.6 C), temperature source Oral, resp. rate 18, height 5\' 1"  (1.549 m), weight 73.846 kg (162 lb 12.8 oz), SpO2 96 %.  Intake/Output Summary (Last 24 hours) at 01/18/16 1008 Last data filed at 01/18/16 0643  Gross per 24 hour  Intake 453.75 ml  Output   1100 ml  Net -646.25 ml    Exam: General: No acute respiratory distress in bed  Lungs: Clear to auscultation bilaterally - no wheeze or crackles Cardiovascular: tachycardic- 2/6 holosystolic M - no gallup or rub  Abdomen: Nondistended, soft, no rebound, no appreciable mass Extremities: No significant cyanosis, clubbing, edema bilateral lower extremities  Data Reviewed: Basic Metabolic Panel:  Recent Labs Lab 01/16/16 0330 01/17/16 0510  NA 141 140  K 4.3 4.6  CL 111 111  CO2 18* 20*  GLUCOSE 176* 132*  BUN 17 13  CREATININE 1.03* 0.87  CALCIUM 8.1* 8.6*  MG 1.4*  --   PHOS 5.0*  --     CBC:  Recent Labs Lab 01/16/16 0330 01/17/16 0510  WBC 10.8* 11.9*  HGB 11.3* 10.6*  HCT 35.8* 33.7*  MCV 88.6 90.3  PLT 166 173    Liver Function Tests: No  results for input(s): AST, ALT, ALKPHOS, BILITOT, PROT, ALBUMIN in the last 168 hours. No results for input(s): LIPASE, AMYLASE in the last 168 hours. No results for input(s): AMMONIA in the last 168 hours.  Coags: No results for input(s): INR in the last 168 hours.  Invalid input(s): PT No results for input(s): APTT in  the last 168 hours.   CBG:  Recent Labs Lab 01/15/16 2009  GLUCAP 183*    Recent Results (from the past 240 hour(s))  MRSA PCR Screening     Status: None   Collection Time: 01/15/16  7:30 PM  Result Value Ref Range Status   MRSA by PCR NEGATIVE NEGATIVE Final    Comment:        The GeneXpert MRSA Assay (FDA approved for NASAL specimens only), is one component of a comprehensive MRSA colonization surveillance program. It is not intended to diagnose MRSA infection nor to guide or monitor treatment for MRSA infections.      Studies:   Recent x-ray studies have been reviewed in detail by the Attending Physician  Scheduled Meds:  Scheduled Meds: . acetaminophen  1,000 mg Intravenous Q6H  . heparin subcutaneous  5,000 Units Subcutaneous Q8H  . [START ON 01/19/2016] levothyroxine  25 mcg Intravenous Daily  . metoprolol  10 mg Intravenous Q4H  . pantoprazole (PROTONIX) IV  40 mg Intravenous QHS    Time spent on care of this patient: 76 mins   Reyne Dumas , MD   [facility] Office  [phone] Pager - Text Page per Shea Evans as per below:  On-Call/Text Page:      [email or web address]      password [email or web address]  If 7PM-7AM, please contact night-coverage [email or web address] Password [email or web address] 01/18/2016, 10:08 AM   LOS: 3 days

## 2016-01-18 NOTE — Progress Notes (Signed)
CM spoke with pt/ daughter ([name]) via phone and shared PT's recommendations for home health PT. Pt and daughter agreed and would like HHPT @ discharge. CM provided choice, however pt has used [facility] in the past and would like to use their services @ d/c. CM to f/u and make referral with [facility] in am contingent on MD's order.  Whitman Hero RN,BSN,CM [phone]

## 2016-01-18 NOTE — Evaluation (Signed)
Physical Therapy Evaluation Patient Details Name: [name] MRN: [id number] DOB: [birth year] Today's Date: 01/18/2016   History of Present Illness  82 y.o. female with history of CAD, tachy-brady s/p pacer, Afib, Stage 3 CKD, HTN, moderate AoS, hypothyroidism, and iron deficiency anemia secondary to gastric cancer now s/p open partial gastrectomy.    Clinical Impression  Patient presents with decreased independence with mobility due to deficits listed in PT problem list.  She will benefit from skilled PT in the acute setting to allow return home with family support and follow up HHPT.    Follow Up Recommendations Home health PT    Equipment Recommendations  None recommended by PT    Recommendations for Other Services       Precautions / Restrictions Precautions Precautions: Fall Precaution Comments: NGT,       Mobility  Bed Mobility Overal bed mobility: Needs Assistance Bed Mobility: Supine to Sit     Supine to sit: Min assist;HOB elevated     General bed mobility comments: with rail and HOB elevated, discussed log roll due to abdominal incision if bed is flat  Transfers Overall transfer level: Needs assistance Equipment used: Rolling walker (2 wheeled) Transfers: Sit to/from [facility] Sit to Stand: Min assist Stand pivot transfers: Min assist       General transfer comment: up from EOB and from 3:1 with lifting assist; stand pivot with RW and min A; pt light headed turning to chair from 3:1 (O2 removed due to length not sufficient on BSC, then replaced in recliner)  Ambulation/Gait                Stairs            Wheelchair Mobility    Modified Rankin (Stroke Patients Only)       Balance Overall balance assessment: Needs assistance Sitting-balance support: Feet supported;No upper extremity supported Sitting balance-Leahy Scale: Fair Sitting balance - Comments: able to perform perineal hygiene after toileting     Standing balance support: Bilateral upper extremity supported Standing balance-Leahy Scale: Poor Standing balance comment: UE support needed for balance                             Pertinent Vitals/Pain Pain Assessment: Faces Faces Pain Scale: Hurts even more Pain Location: abdomen Pain Descriptors / Indicators: Operative site guarding Pain Intervention(s): Repositioned;Monitored during session    [place] expects to be discharged to:: Private residence Living Arrangements: Children (daughter) Available Help at Discharge: Family;Available 24 hours/day Type of Home: House Home Access: Stairs to enter Entrance Stairs-Rails: Left Entrance Stairs-Number of Steps: 1 Home Layout: Multi-level Home Equipment: Walker - 2 wheels;Cane - quad;Grab bars - toilet;Grab bars - tub/shower;Wheelchair - manual;Shower seat      Prior Function Level of Independence: Independent with assistive device(s)               Hand Dominance   Dominant Hand: Right    Extremity/Trunk Assessment               Lower Extremity Assessment: RLE deficits/detail RLE Deficits / Details: h/o multiple surgeries on R leg after TKA and subsequent infection and tendon repair, limited AROM at knee strength 3-4/5 throughout (with extension lag)       Communication   Communication: HOH  Cognition Arousal/Alertness: Awake/alert Behavior During Therapy: WFL for tasks assessed/performed Overall Cognitive Status: Within Functional Limits for tasks assessed  General Comments      Exercises        Assessment/Plan    PT Assessment Patient needs continued PT services  PT Diagnosis Generalized weakness;Acute pain;Difficulty walking   PT Problem List Decreased strength;Decreased activity tolerance;Decreased balance;Decreased mobility;Pain;Cardiopulmonary status limiting activity  PT Treatment Interventions DME instruction;Gait training;Balance  training;Functional mobility training;Patient/family education;Stair training;Therapeutic activities;Therapeutic exercise   PT Goals (Current goals can be found in the Care Plan section) Acute Rehab PT Goals Patient Stated Goal: To get stronger and go home PT Goal Formulation: With patient/family Time For Goal Achievement: 01/25/16 Potential to Achieve Goals: Good    Frequency Min 3X/week   Barriers to discharge        Co-evaluation               End of Session Equipment Utilized During Treatment: Oxygen Activity Tolerance: Patient limited by fatigue Patient left: in chair;with call bell/phone within reach;with chair alarm set           Time: [id number] PT Time Calculation (min) (ACUTE ONLY): 27 min   Charges:   PT Evaluation $PT Eval Moderate Complexity: 1 Procedure PT Treatments $Therapeutic Activity: 8-22 mins   PT G CodesReginia Naas 2016-02-03, 1:48 PM Magda Kiel, [place] Feb 03, 2016

## 2016-01-18 NOTE — Progress Notes (Signed)
Pt daughter refused her mom foley  catheter to be removed, pt daughter said she wants her mom to become a little bit active before she will let her cathter to be taken off, will continue to monitor

## 2016-01-19 ENCOUNTER — Encounter (HOSPITAL_COMMUNITY): Payer: Self-pay | Admitting: General Practice

## 2016-01-19 LAB — COMPREHENSIVE METABOLIC PANEL
ALK PHOS: 71 U/L (ref 38–126)
ALT: 16 U/L (ref 14–54)
ANION GAP: 7 (ref 5–15)
AST: 20 U/L (ref 15–41)
Albumin: 2.6 g/dL — ABNORMAL LOW (ref 3.5–5.0)
BILIRUBIN TOTAL: 0.9 mg/dL (ref 0.3–1.2)
BUN: 5 mg/dL — ABNORMAL LOW (ref 6–20)
CALCIUM: 8.6 mg/dL — AB (ref 8.9–10.3)
CO2: 24 mmol/L (ref 22–32)
Chloride: 108 mmol/L (ref 101–111)
Creatinine, Ser: 0.7 mg/dL (ref 0.44–1.00)
GFR calc non Af Amer: >60 mL/min (ref >60)
GLUCOSE: 123 mg/dL — AB (ref 65–99)
Potassium: 3.1 mmol/L — ABNORMAL LOW (ref 3.5–5.1)
SODIUM: 139 mmol/L (ref 135–145)
TOTAL PROTEIN: 5.7 g/dL — AB (ref 6.5–8.1)

## 2016-01-19 LAB — CBC
HEMATOCRIT: 30.5 % — AB (ref 36.0–46.0)
HEMOGLOBIN: 10.2 g/dL — AB (ref 12.0–15.0)
MCH: 30.1 pg (ref 26.0–34.0)
MCHC: 33.4 g/dL (ref 30.0–36.0)
MCV: 90 fL (ref 78.0–100.0)
Platelets: 159 10*3/uL (ref 150–400)
RBC: 3.39 MIL/uL — AB (ref 3.87–5.11)
RDW: 17.7 % — ABNORMAL HIGH (ref 11.5–15.5)
WBC: 7.8 10*3/uL (ref 4.0–10.5)

## 2016-01-19 MED ORDER — POTASSIUM CHLORIDE 10 MEQ/100ML IV SOLN
10.0000 meq | INTRAVENOUS | Status: AC
  Administered 2016-01-19 (×4): 10 meq via INTRAVENOUS
  Filled 2016-01-19 (×4): qty 100

## 2016-01-19 MED ORDER — LORATADINE 10 MG PO TABS
10.0000 mg | ORAL_TABLET | Freq: Every day | ORAL | Status: DC
Start: 2016-01-19 — End: 2016-01-26
  Administered 2016-01-19 – 2016-01-26 (×8): 10 mg via ORAL
  Filled 2016-01-19 (×8): qty 1

## 2016-01-19 MED ORDER — HYDRALAZINE HCL 20 MG/ML IJ SOLN: 5.0000 mg | INTRAMUSCULAR | Status: DC | PRN

## 2016-01-19 MED ORDER — TRAMADOL HCL 50 MG PO TABS
50.0000 mg | ORAL_TABLET | Freq: Four times a day (QID) | ORAL | Status: DC | PRN
  Administered 2016-01-19 – 2016-01-25 (×8): 50 mg via ORAL
  Filled 2016-01-19 (×9): qty 1

## 2016-01-19 MED ORDER — KCL IN DEXTROSE-NACL 40-5-0.9 MEQ/L-%-% IV SOLN
INTRAVENOUS | Status: DC
  Administered 2016-01-19: 12:00:00 via INTRAVENOUS
  Filled 2016-01-19 (×3): qty 1000

## 2016-01-19 MED ORDER — ATENOLOL 50 MG PO TABS
100.0000 mg | ORAL_TABLET | Freq: Two times a day (BID) | ORAL | Status: DC
  Administered 2016-01-19 – 2016-01-26 (×15): 100 mg via ORAL
  Filled 2016-01-19 (×15): qty 2

## 2016-01-19 MED ORDER — FUROSEMIDE 20 MG PO TABS
20.0000 mg | ORAL_TABLET | Freq: Every day | ORAL | Status: DC
  Administered 2016-01-19 – 2016-01-26 (×8): 20 mg via ORAL
  Filled 2016-01-19 (×8): qty 1

## 2016-01-19 MED ORDER — BUPIVACAINE 0.25 % ON-Q PUMP DUAL CATH 300 ML
300.0000 mL | INJECTION | Status: DC
  Filled 2016-01-19: qty 300

## 2016-01-19 MED ORDER — NITROGLYCERIN 0.4 MG SL SUBL: 0.4000 mg | SUBLINGUAL_TABLET | SUBLINGUAL | Status: DC | PRN

## 2016-01-19 MED ORDER — PREGABALIN 75 MG PO CAPS
75.0000 mg | ORAL_CAPSULE | Freq: Two times a day (BID) | ORAL | Status: DC
Start: 2016-01-19 — End: 2016-01-26
  Administered 2016-01-19 – 2016-01-26 (×15): 75 mg via ORAL
  Filled 2016-01-19 (×15): qty 1

## 2016-01-19 MED ORDER — SERTRALINE HCL 50 MG PO TABS
50.0000 mg | ORAL_TABLET | Freq: Every day | ORAL | Status: DC
  Administered 2016-01-19 – 2016-01-25 (×7): 50 mg via ORAL
  Filled 2016-01-19 (×8): qty 1

## 2016-01-19 MED ORDER — MAGNESIUM SULFATE 2 GM/50ML IV SOLN
2.0000 g | Freq: Once | INTRAVENOUS | Status: AC
  Administered 2016-01-19: 2 g via INTRAVENOUS
  Filled 2016-01-19: qty 50

## 2016-01-19 NOTE — Progress Notes (Signed)
PT Cancellation Note  Patient Details Name: [name] MRN: [id number] DOB: 03-21-1934   Cancelled Treatment:    Reason Eval/Treat Not Completed: Other (comment) (Pt is on [facility] continually after being reintroduced to liquids).  Will see again tomorrow as pt is able.   [facility] Dial 01/19/2016, 3:17 PM    Mee Hives, PT MS Acute Rehab Dept. Number: [facility] [id number] and [place] [phone]

## 2016-01-19 NOTE — Progress Notes (Signed)
Patient ID: [name], female   DOB: [birth year], 82 y.o.   MRN: [id number] 4 Days Post-Op   Subjective: Pt has controlled pain.  She is sore, but is doing OK.  Objective: Vital signs in last 24 hours: Temp:  [98 F (36.7 C)-98.4 F (36.9 C)] 98.4 F (36.9 C) (05/15 0518) Pulse Rate:  [82-93] 91 (05/15 0739) Resp:  [17-20] 17 (05/15 0739) BP: (156-191)/(72-100) 164/85 mmHg (05/15 0739) SpO2:  [95 %-97 %] 97 % (05/15 0739) Last BM Date: 01/18/16  Intake/Output from previous day: 05/14 0701 - 05/15 0700 In: 75 [I.V.:75] Out: -  Intake/Output this shift: Total I/O In: 37.5 [I.V.:37.5] Out: -   General appearance: Alert.  Cooperative.  Mental status reasonably normal.  No distress. Resp: breathing comfortably Heart: Irregularly irregular. GI: Incision clean.  Honeycomb dressing in place.  Abdomen soft.  Hypoactive bowel sounds.  Minimally tender.  Lab Results:  Results for orders placed or performed during the hospital encounter of 01/15/16 (from the past 24 hour(s))  CBC     Status: Abnormal   Collection Time: 01/19/16  7:48 AM  Result Value Ref Range   WBC 7.8 4.0 - 10.5 K/uL   RBC 3.39 (L) 3.87 - 5.11 MIL/uL   Hemoglobin 10.2 (L) 12.0 - 15.0 g/dL   HCT 30.5 (L) 36.0 - 46.0 %   MCV 90.0 78.0 - 100.0 fL   MCH 30.1 26.0 - 34.0 pg   MCHC 33.4 30.0 - 36.0 g/dL   RDW 17.7 (H) 11.5 - 15.5 %   Platelets 159 150 - 400 K/uL  Comprehensive metabolic panel     Status: Abnormal   Collection Time: 01/19/16  7:48 AM  Result Value Ref Range   Sodium 139 135 - 145 mmol/L   Potassium 3.1 (L) 3.5 - 5.1 mmol/L   Chloride 108 101 - 111 mmol/L   CO2 24 22 - 32 mmol/L   Glucose, Bld 123 (H) 65 - 99 mg/dL   BUN 5 (L) 6 - 20 mg/dL   Creatinine, Ser 0.70 0.44 - 1.00 mg/dL   Calcium 8.6 (L) 8.9 - 10.3 mg/dL   Total Protein 5.7 (L) 6.5 - 8.1 g/dL   Albumin 2.6 (L) 3.5 - 5.0 g/dL   AST 20 15 - 41 U/L   ALT 16 14 - 54 U/L   Alkaline Phosphatase 71 38 - 126 U/L   Total Bilirubin 0.9  0.3 - 1.2 mg/dL   GFR calc non Af Amer >60 >60 mL/min   GFR calc Af Amer >60 >60 mL/min   Anion gap 7 5 - 15     Studies/Results: Dg Ugi W/water Sol Cm  01/18/2016  CLINICAL DATA:  82 year old female status post partial gastrectomy. Evaluate for potential postoperative leak or obstruction. EXAM: WATER SOLUBLE UPPER GI SERIES TECHNIQUE: Single-column upper GI series was performed using water soluble contrast. CONTRAST:  60 mL of Gastrografin COMPARISON:  No priors. FLUOROSCOPY TIME:  If the device does not provide the exposure index: Fluoroscopy Time (in minutes and seconds):  1 minutes and 18 seconds Number of Acquired Images:  5 series with multiple images. FINDINGS: Initial image demonstrated a nasogastric a tube in place with tip in the gastric remnant and side-port in the distal esophagus. Midline skin staples are noted. Pacemaker leads extending over the expected location of the right atrial appendage and right ventricular apex. Gastrografin was infused via the patient's indwelling NG tube, which demonstrated a small gastric remnant. There was gastroesophageal reflux into the  esophagus throughout the examination. Contrast readily traversed the pylorus into the proximal small bowel. There was no extravasation of contrast material at any point during the examination. IMPRESSION: 1. Expected postoperative appearance of the stomach following partial gastrectomy, without evidence of postoperative leak or obstruction. These results will be called to the ordering clinician or representative by the Radiologist Assistant, and communication documented in the PACS or zVision Dashboard. Electronically Signed   By: [name] M.D.   On: 01/18/2016 15:59    . acetaminophen  1,000 mg Intravenous Q6H  . diatrizoate meglumine-sodium  90 mL Oral Once  . heparin subcutaneous  5,000 Units Subcutaneous Q8H  . levothyroxine  25 mcg Intravenous Daily  . magnesium sulfate 1 - 4 g bolus IVPB  2 g Intravenous Once   . metoprolol  10 mg Intravenous Q4H  . pantoprazole (PROTONIX) IV  40 mg Intravenous QHS  . potassium chloride  10 mEq Intravenous Q1 Hr x 4     Assessment/Plan: s/p Procedure(s): LAPAROSCOPY DIAGNOSTIC OPEN PARTIAL GASTRECTOMY  POD #4.  Open partial gastrectomy for gastric neoplasm Await pathology Advance diet to clears.   Restart some PO meds.   KVO IVF.  VTE prophylaxis.  On subcutaneous heparin  Chronic atrial fibrillation on chronic Coumadin.  Will hold off on full anticoagulation due to her Jehovah's Witness status and until we see what her CBC shows tomorrow.  Possibly move to full anticoagulation tomorrow.    LOS: 4 days    Kajuana Shareef 01/19/2016  . .prob

## 2016-01-19 NOTE — Progress Notes (Signed)
ANTICOAGULATION CONSULT NOTE - Initial Consult  Pharmacy Consult for coumadin Indication: atrial fibrillation  Allergies  Allergen Reactions  . Amiodarone      Thyroid and liver and kidney problems  . Codeine Nausea And Vomiting  . Gabapentin Itching  . Hydrocodone Other (See Comments)    hallucination  . Morphine And Related Nausea And Vomiting  . Requip [Ropinirole Hcl]     Headache   . Zinc     nausea    Patient Measurements: Height: 5\' 1"  (154.9 cm) Weight: 162 lb 12.8 oz (73.846 kg) IBW/kg (Calculated) : 47.8  Vital Signs: Temp: 98.4 F (36.9 C) (05/15 0518) Temp Source: Oral (05/15 0518) BP: 151/76 mmHg (05/15 1353) Pulse Rate: 67 (05/15 1353)  Labs:  Recent Labs  01/17/16 0510 01/18/16 1016 01/19/16 0748  HGB 10.6* 10.3* 10.2*  HCT 33.7* 33.4* 30.5*  PLT 173 171 159  CREATININE 0.87  --  0.70    Estimated Creatinine Clearance: 52.4 mL/min (by C-G formula based on Cr of 0.7).  Assessment: 81 YOF admitted with upper GIB with gastric cancer, s/p open partial gastrectomy. Per discussion with [name], will continue to hold coumadin today, hopefully will be able to restart full dose anticoagulation tomorrow. She is on sq heparin for VTE prophylaxis. CBC stable    Goal of Therapy:  INR 2-3 Monitor platelets by anticoagulation protocol: Yes   Plan:  - Hold coumadin for now - INR in AM - Will f/u Surgery recommendations for restarting anticoagulation  Maryanna Shape, PharmD, BCPS  Clinical Pharmacist  Pager: [phone]   01/19/2016,2:20 PM

## 2016-01-19 NOTE — Progress Notes (Signed)
[facility] TEAM 1 - Stepdown/ICU TEAM CONSULT F/U NOTE  [name] [id number] DOB: 06/13/1934 DOA: 01/15/2016 PCP: [name], MD   Admit HPI / Brief Narrative: 82 y.o. female with history of CAD, tachy-brady s/p pacer, Afib, Stage 3 CKD, HTN, moderate AoS, hypothyroidism, and iron deficiency anemia secondary to gastric cancer with a chronically bleeding polypoid lesion of the stomach complicated by patient's religious beliefs (Jehovah witness) such that the primary modality of treatment has been iron infusions.  She was admitted to the Gen Surgery service to undergo elective open subtotal gastrectomy.  Postop the surgeon requested medical consultation to assist with management of her chronic medical issues.  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 -per primary team (Gen Surgery)  Chronic atrial fibrillation -CHADVASc 5 - rate not well controlled at present on IV metoprolol, patient currentl -  General surgery to decide the timing of anticoagulation with Coumadin Check FOBT, if positive and hemoglobin still  Trending down, recommend holding anticoagulation until hemoglobin stabilizes, also pending NPO status ,therefore cannot receive Coumadin   Iron deficiency anemia due to chronic blood loss - refuses transfusion  -related to gastric carcinoma which has now been resected -Hgb stabilizing- Gen Surg is following - agree w/ holding off on full anticoag for now  Agree with PPI  Recent Labs Lab 01/16/16 0330 01/17/16 0510 01/18/16 1016 01/19/16 0748  HGB 11.3* 10.6* 10.3* 10.2*    Essential hypertension -BP remains poorly controlled , patient takes atenolol, Lasix, Cozaar,at home -TTE March 2017 with preserved LV function and mild concentric hypertrophy but study was not technically sufficient to evaluate LV diastolic  dysfunction  Chronic kidney disease stage III -Renal function stable   Recent Labs Lab 01/16/16 0330 01/17/16 0510 01/19/16 0748  CREATININE 1.03* 0.87 0.70    Hypothyroidism Currently on IV dose to 47mcg - TSH 0.851 (at goal) 11/07/2015. Switch back to by mouth Synthroid  50 g when the patient starts  PO  intake  Aortic stenosis, moderate Not hemodynamically significant at present  Protein-calorie malnutrition, moderate  -nutrition per primary team   Hypomagnesemia/Hypokalemia Replete and recheck   Code Status: FULL Family Communication: Discussed with family by the bedside  Antibiotics: none  DVT prophylaxis: SQ heparin   Objective: Blood pressure 164/85, pulse 91, temperature 98.4 F (36.9 C), temperature source Oral, resp. rate 17, height 5\' 1"  (1.549 m), weight 73.846 kg (162 lb 12.8 oz), SpO2 97 %.  Intake/Output Summary (Last 24 hours) at 01/19/16 1105 Last data filed at 01/19/16 0730  Gross per 24 hour  Intake  112.5 ml  Output      0 ml  Net  112.5 ml    Exam: General: No acute respiratory distress in bed  Lungs: Clear to auscultation bilaterally - no wheeze or crackles Cardiovascular: tachycardic- 2/6 holosystolic M - no gallup or rub  Abdomen: Nondistended, soft, no rebound, no appreciable mass Extremities: No significant cyanosis, clubbing, edema bilateral lower extremities  Data Reviewed: Basic Metabolic Panel:  Recent Labs Lab 01/16/16 0330 01/17/16 0510 01/18/16 1016 01/19/16 0748  NA 141 140  --  139  K 4.3 4.6  --  3.1*  CL 111 111  --  108  CO2 18* 20*  --  24  GLUCOSE 176* 132*  --  123*  BUN 17 13  --  5*  CREATININE 1.03* 0.87  --  0.70  CALCIUM 8.1* 8.6*  --  8.6*  MG 1.4*  --  1.7  --   PHOS 5.0*  --   --   --     CBC:  Recent Labs Lab 01/16/16 0330 01/17/16 0510 01/18/16 1016 01/19/16 0748  WBC 10.8* 11.9* 9.5 7.8  HGB 11.3* 10.6* 10.3* 10.2*  HCT 35.8* 33.7* 33.4* 30.5*  MCV 88.6 90.3 90.5 90.0  PLT 166 173  171 159    Liver Function Tests:  Recent Labs Lab 01/18/16 1016 01/19/16 0748  AST 25 20  ALT 17 16  ALKPHOS 70 71  BILITOT 0.9 0.9  PROT 5.6* 5.7*  ALBUMIN 2.5* 2.6*   No results for input(s): LIPASE, AMYLASE in the last 168 hours. No results for input(s): AMMONIA in the last 168 hours.  Coags: No results for input(s): INR in the last 168 hours.  Invalid input(s): PT No results for input(s): APTT in the last 168 hours.   CBG:  Recent Labs Lab 01/15/16 2009  GLUCAP 183*    Recent Results (from the past 240 hour(s))  MRSA PCR Screening     Status: None   Collection Time: 01/15/16  7:30 PM  Result Value Ref Range Status   MRSA by PCR NEGATIVE NEGATIVE Final    Comment:        The GeneXpert MRSA Assay (FDA approved for NASAL specimens only), is one component of a comprehensive MRSA colonization surveillance program. It is not intended to diagnose MRSA infection nor to guide or monitor treatment for MRSA infections.      Studies:   Recent x-ray studies have been reviewed in detail by the Attending Physician  Scheduled Meds:  Scheduled Meds: . acetaminophen  1,000 mg Intravenous Q6H  . diatrizoate meglumine-sodium  90 mL Oral Once  . heparin subcutaneous  5,000 Units Subcutaneous Q8H  . levothyroxine  25 mcg Intravenous Daily  . magnesium sulfate 1 - 4 g bolus IVPB  2 g Intravenous Once  . metoprolol  10 mg Intravenous Q4H  . pantoprazole (PROTONIX) IV  40 mg Intravenous QHS  . potassium chloride  10 mEq Intravenous Q1 Hr x 4    Time spent on care of this patient: 42 mins   Reyne Dumas , MD   [facility] Office  [phone] Pager - Text Page per Shea Evans as per below:  On-Call/Text Page:      [email or web address]      password [email or web address]  If 7PM-7AM, please contact night-coverage [email or web address] Password [email or web address] 01/19/2016, 11:05 AM   LOS: 4 days

## 2016-01-19 NOTE — Consult Note (Signed)
   [facility] CM Inpatient Consult   01/19/2016  [name] [birth year] [id number]   Referral received.  Patient evaluated for community based chronic disease management services with [place] [facility] as a benefit of patient's [place]. Patient is a 82 y/o with history of CAD, tachy-brady s/p pacer, Afib, Stage 3 CKD, HTN,  hypothyroidism, and iron deficiency anemia secondary to gastric cancer with a chronically bleeding polypoid lesion of the stomach.   Spoke with patient and [name],  at bedside to explain [place] [facility] services.  Patient was dozing on and off during the visit.  [name] states, "the insurance company use to call and talk about additional services but before this diagnosis we felt like that was not necessary.  We will take your brochure and look it over.  But, honestly we haven't getting every thing back from the pathology or to even see how her diet will change, if at all."  Explained that the patient will receive post hospital discharge call and will be evaluated for monthly home visits for assessments and disease process education.Endorses Dr. [name] as their primary care provider.   Currently, wanted to look over the information and await information from MD before consenting to services.    Left contact information and [facility] brochure at bedside. Made Inpatient Case Manager aware that [place] [facility] following. Of note, [facility] Care Management services does not replace or interfere with any services that are arranged by inpatient case management or social work.  For additional questions or referrals please contact:    Natividad Brood, RN BSN [facility] Liaison  [phone] business mobile phone Toll free office [phone]

## 2016-01-20 LAB — BASIC METABOLIC PANEL
ANION GAP: 12 (ref 5–15)
BUN: 6 mg/dL (ref 6–20)
CHLORIDE: 106 mmol/L (ref 101–111)
CO2: 24 mmol/L (ref 22–32)
Calcium: 9 mg/dL (ref 8.9–10.3)
Creatinine, Ser: 0.89 mg/dL (ref 0.44–1.00)
GFR calc Af Amer: >60 mL/min (ref >60)
Glucose, Bld: 99 mg/dL (ref 65–99)
POTASSIUM: 3.2 mmol/L — AB (ref 3.5–5.1)
SODIUM: 142 mmol/L (ref 135–145)

## 2016-01-20 LAB — CBC
HCT: 31 % — ABNORMAL LOW (ref 36.0–46.0)
HEMOGLOBIN: 10 g/dL — AB (ref 12.0–15.0)
MCH: 29.1 pg (ref 26.0–34.0)
MCHC: 32.3 g/dL (ref 30.0–36.0)
MCV: 90.1 fL (ref 78.0–100.0)
PLATELETS: 193 10*3/uL (ref 150–400)
RBC: 3.44 MIL/uL — AB (ref 3.87–5.11)
RDW: 17.9 % — ABNORMAL HIGH (ref 11.5–15.5)
WBC: 6 10*3/uL (ref 4.0–10.5)

## 2016-01-20 LAB — PROTIME-INR
INR: 1.18 (ref 0.00–1.49)
PROTHROMBIN TIME: 15.2 s (ref 11.6–15.2)

## 2016-01-20 MED ORDER — PANTOPRAZOLE SODIUM 40 MG PO TBEC
40.0000 mg | DELAYED_RELEASE_TABLET | Freq: Every day | ORAL | Status: DC
  Administered 2016-01-20 – 2016-01-26 (×7): 40 mg via ORAL
  Filled 2016-01-20 (×7): qty 1

## 2016-01-20 MED ORDER — POTASSIUM CHLORIDE CRYS ER 20 MEQ PO TBCR
20.0000 meq | EXTENDED_RELEASE_TABLET | Freq: Two times a day (BID) | ORAL | Status: DC
  Administered 2016-01-20: 20 meq via ORAL
  Filled 2016-01-20: qty 1

## 2016-01-20 MED ORDER — WARFARIN - PHARMACIST DOSING INPATIENT: Freq: Every day | Status: DC

## 2016-01-20 MED ORDER — ACETAMINOPHEN 325 MG PO TABS
650.0000 mg | ORAL_TABLET | ORAL | Status: DC | PRN
  Administered 2016-01-20 – 2016-01-24 (×7): 650 mg via ORAL
  Filled 2016-01-20 (×7): qty 2

## 2016-01-20 MED ORDER — METHOCARBAMOL 500 MG PO TABS: 500.0000 mg | ORAL_TABLET | Freq: Three times a day (TID) | ORAL | Status: DC | PRN

## 2016-01-20 MED ORDER — WARFARIN SODIUM 2 MG PO TABS
2.0000 mg | ORAL_TABLET | Freq: Once | ORAL | Status: AC
  Administered 2016-01-20: 2 mg via ORAL
  Filled 2016-01-20: qty 1

## 2016-01-20 MED ORDER — ENSURE ENLIVE PO LIQD: 237.0000 mL | Freq: Two times a day (BID) | ORAL | Status: DC

## 2016-01-20 MED ORDER — POTASSIUM CHLORIDE CRYS ER 20 MEQ PO TBCR
40.0000 meq | EXTENDED_RELEASE_TABLET | Freq: Two times a day (BID) | ORAL | Status: DC
  Administered 2016-01-20 – 2016-01-21 (×2): 40 meq via ORAL
  Filled 2016-01-20 (×2): qty 2

## 2016-01-20 MED ORDER — LEVOTHYROXINE SODIUM 50 MCG PO TABS
50.0000 ug | ORAL_TABLET | Freq: Every day | ORAL | Status: DC
  Administered 2016-01-20 – 2016-01-26 (×7): 50 ug via ORAL
  Filled 2016-01-20 (×7): qty 1

## 2016-01-20 NOTE — Clinical Documentation Improvement (Signed)
Hospitalists  Please document query responses in the progress notes and discharge summary. Please do not document query responses on the CDI BPA form in [facility]. Please do not deactivate queries without responding.  Thank you.  "CHF" is documented in the Hospitalist Consult Note dated 01/15/16. Patient is currently on Lasix 20 mg po daily as of 01/19/16 per Med section of [facility]. Patient takes Lasix daily at home per Anesthesia Record. "Echocardiogram in March 2017 with preserved LV function and mild concentric hypertrophy but study was not technically sufficient to allow for evaluation of LV diastolic dysfunction in setting of atrial fibrillation" is documented in the Hospitalist Consult Note dated 01/15/16.  If know or able to determine, please document the Acuity and Type of Heart Failure monitored and treated this admission:  Acuity:  Acute;  Chronic;  Acute on Chronic;  Other acuity;  Unable to clinically determine  Type:  Systolic;  Diastolic;  Combined;  Other type;  Unable to clinically determine  Please exercise your independent, professional judgment when responding. A specific answer is not anticipated or expected.   Thank You, Erling Conte  RN BSN CCDS [phone] Health Information Management [place]

## 2016-01-20 NOTE — Progress Notes (Addendum)
ANTICOAGULATION CONSULT NOTE - Initial Consult  Pharmacy Consult for coumadin Indication: atrial fibrillation  Allergies  Allergen Reactions  . Amiodarone      Thyroid and liver and kidney problems  . Codeine Nausea And Vomiting  . Gabapentin Itching  . Hydrocodone Other (See Comments)    hallucination  . Morphine And Related Nausea And Vomiting  . Requip [Ropinirole Hcl]     Headache   . Zinc     nausea    Patient Measurements: Height: 5\' 1"  (154.9 cm) Weight: 162 lb 12.8 oz (73.846 kg) IBW/kg (Calculated) : 47.8  Vital Signs: Temp: 98.3 F (36.8 C) (05/16 0636) Temp Source: Oral (05/16 0636) BP: 153/80 mmHg (05/16 0956) Pulse Rate: 83 (05/16 0956)  Labs:  Recent Labs  01/18/16 1016 01/19/16 0748 01/20/16 0704  HGB 10.3* 10.2* 10.0*  HCT 33.4* 30.5* 31.0*  PLT 171 159 193  LABPROT  --   --  15.2  INR  --   --  1.18  CREATININE  --  0.70 0.89    Estimated Creatinine Clearance: 47.1 mL/min (by C-G formula based on Cr of 0.89).  Assessment: 45 YOF admitted with upper GIB with gastric cancer, s/p open partial gastrectomy. Per [name], ok to restart full dose anticoagulation today. She is on sq heparin for VTE prophylaxis. CBC stable with hgb = 10, pltc = 193K. INR 1.18   She was previously on 2mg  daily except for 3 mg on MWF per coumadin clinic note  Goal of Therapy:  INR 2-3 Monitor platelets by anticoagulation protocol: Yes   Plan:  - Coumadin 2mg  po x 1 - Daily PT/INR - Monitor CBC closely - Continue sq heparin until INR is near therapeutic range.  Maryanna Shape, PharmD, BCPS  Clinical Pharmacist  Pager: [phone]   01/20/2016,10:55 AM

## 2016-01-20 NOTE — Progress Notes (Signed)
Patient ID: [name], female   DOB: 1934/03/10, 82 y.o.   MRN: [id number] 5 Days Post-Op   Subjective: Pt did OK with clears yesterday.  Had some nausea yesterday which is resolved.     Objective: Vital signs in last 24 hours: Temp:  [98.3 F (36.8 C)-98.8 F (37.1 C)] 98.3 F (36.8 C) (05/16 0636) Pulse Rate:  [67-96] 96 (05/16 0636) Resp:  [17-18] 18 (05/16 0636) BP: (151-170)/(74-96) 166/79 mmHg (05/16 0636) SpO2:  [91 %-97 %] 91 % (05/16 0636) Last BM Date: 01/18/16  Intake/Output from previous day: 05/15 0701 - 05/16 0700 In: 1208.2 [P.O.:680; I.V.:178.2; IV Piggyback:350] Out: -  Intake/Output this shift:    General appearance: Alert.  Cooperative.  Mental status reasonably normal.  No distress. Resp: breathing comfortably Heart: Irregularly irregular. GI: Incision clean.  Honeycomb dressing in place.  Abdomen soft.  Hypoactive bowel sounds.  Minimally tender.  Lab Results:  Results for orders placed or performed during the hospital encounter of 01/15/16 (from the past 24 hour(s))  CBC     Status: Abnormal   Collection Time: 01/19/16  7:48 AM  Result Value Ref Range   WBC 7.8 4.0 - 10.5 K/uL   RBC 3.39 (L) 3.87 - 5.11 MIL/uL   Hemoglobin 10.2 (L) 12.0 - 15.0 g/dL   HCT 30.5 (L) 36.0 - 46.0 %   MCV 90.0 78.0 - 100.0 fL   MCH 30.1 26.0 - 34.0 pg   MCHC 33.4 30.0 - 36.0 g/dL   RDW 17.7 (H) 11.5 - 15.5 %   Platelets 159 150 - 400 K/uL  Comprehensive metabolic panel     Status: Abnormal   Collection Time: 01/19/16  7:48 AM  Result Value Ref Range   Sodium 139 135 - 145 mmol/L   Potassium 3.1 (L) 3.5 - 5.1 mmol/L   Chloride 108 101 - 111 mmol/L   CO2 24 22 - 32 mmol/L   Glucose, Bld 123 (H) 65 - 99 mg/dL   BUN 5 (L) 6 - 20 mg/dL   Creatinine, Ser 0.70 0.44 - 1.00 mg/dL   Calcium 8.6 (L) 8.9 - 10.3 mg/dL   Total Protein 5.7 (L) 6.5 - 8.1 g/dL   Albumin 2.6 (L) 3.5 - 5.0 g/dL   AST 20 15 - 41 U/L   ALT 16 14 - 54 U/L   Alkaline Phosphatase 71 38 - 126 U/L    Total Bilirubin 0.9 0.3 - 1.2 mg/dL   GFR calc non Af Amer >60 >60 mL/min   GFR calc Af Amer >60 >60 mL/min   Anion gap 7 5 - 15     Studies/Results: No results found.  [name] atenolol  100 mg Oral BID  . furosemide  20 mg Oral Daily  . heparin subcutaneous  5,000 Units Subcutaneous Q8H  . levothyroxine  50 mcg Oral QAC breakfast  . loratadine  10 mg Oral Daily  . pantoprazole  40 mg Oral Q1200  . potassium chloride SA  20 mEq Oral BID  . pregabalin  75 mg Oral BID  . sertraline  50 mg Oral Daily     Assessment/Plan: s/p Procedure(s): LAPAROSCOPY DIAGNOSTIC OPEN PARTIAL GASTRECTOMY  POD #5.  Open partial gastrectomy for gastric neoplasm Await pathology Advance diet to fulls. Restart some PO meds.   KVO IVF.  VTE prophylaxis.  On subcutaneous heparin  Chronic atrial fibrillation on chronic Coumadin.  Hgb/HCT pending.  OK to start coumadin today without bolus if stable.  LOS: 5 days    Saivon Prowse 01/20/2016  . .prob

## 2016-01-20 NOTE — Progress Notes (Signed)
Initial Nutrition Assessment  DOCUMENTATION CODES:   Obesity unspecified  INTERVENTION:   -Ensure Enlive po BID, each supplement provides 350 kcal and 20 grams of protein -RD will follow for diet advancement  NUTRITION DIAGNOSIS:   Inadequate oral intake related to altered GI function as evidenced by meal completion < 50%, per patient/family report.  GOAL:   Patient will meet greater than or equal to 90% of their needs  MONITOR:   PO intake, Supplement acceptance, Diet advancement, Labs, Weight trends, Skin, I & O's  REASON FOR ASSESSMENT:   Malnutrition Screening Tool, Consult Assessment of nutrition requirement/status  ASSESSMENT:   [name] is a 82 y.o. female with a Past Medical History of PAF, CAD, left ear, tachybradycardia syndrome, status post PPM, GERD, RLS, HA, kidney and endometrial cancers, HTN, HLD, gastric cancer, CHF, CK, CVA who presents for partial gastrectomy by general surgery after which time we were consult for assistance with maintenance of chronic medical conditions.  Pt admitted with gastric cancer.   s/p Procedure(s) 01/15/16: LAPAROSCOPY DIAGNOSTIC OPEN PARTIAL GASTRECTOMY  Hx obtained from pt and pt daughter at bedside. Both reports fair appetite PTA. Pt daughter reports she noticed a decline in intake over the past 1-2 months PTA, due to pt complaints of early satiety. Pt generally consumes 3 meals per day and an HS snack. Both pt and daughter express concern over pt's minimal PO intake s/p surgery. Pt shares that she gets indigestion with PO intake (which is a new issue since surgery). She consumed 50% of grits, 100% of orange juice, and a few bites of ice cream on full liquid tray at breakfast.   Pt reports a distant hx of weight loss > 2 years ago, which was unintentional, due to inactivity. Pt reveals UBW is around 145#.   Nutrition-Focused physical exam completed. Findings are no fat depletion, mild muscle depletion, and mild edema.  Mild muscle depletion noted on rt leg only; pt reports this is her "weak" leg and had multiple surgeries including a knee replacement approximately 2 years ago.   Discussed importance of good meal intake to promote healing. Encouraged high protein diet and discussed examples of ways pt could increased protein intake in diet (identified many high protein foods that pt enjoyed). Both pt and daughter are amenable to nutrition supplements and discussed options on both hospital formulary and options in the retail setting. Provided pt a chocolate Ensure to try; pt enjoyed flavor and was amenable to supplement.   Labs reviewed: K: 3.1 (on PO supplementation).   Diet Order:  Diet full liquid Room service appropriate?: Yes; Fluid consistency:: Thin  Skin:  Reviewed, no issues  Last BM:  01/19/16  Height:   Ht Readings from Last 1 Encounters:  01/17/16 5\' 1"  (1.549 m)    Weight:   Wt Readings from Last 1 Encounters:  01/17/16 162 lb 12.8 oz (73.846 kg)    Ideal Body Weight:  47.7 kg  BMI:  Body mass index is 30.78 kg/(m^2).  Estimated Nutritional Needs:   Kcal:  1500-1700  Protein:  65-80 grams  Fluid:  1.5-1.7 L  EDUCATION NEEDS:   Education needs addressed  Analysse Quinonez A. Jimmye Norman, RD, LDN, CDE Pager: [phone] After hours Pager: [phone]

## 2016-01-20 NOTE — Progress Notes (Addendum)
[facility] TEAM 1 - Stepdown/ICU TEAM CONSULT F/U NOTE  [name] [id number] DOB: [birth year] DOA: 01/15/2016 PCP: [name], MD   Admit HPI / Brief Narrative: 82 y.o. female with history of CAD, tachy-brady s/p pacer, Afib, Stage 3 CKD, HTN, moderate AoS, hypothyroidism, and iron deficiency anemia secondary to gastric cancer with a chronically bleeding polypoid lesion of the stomach complicated by patient's religious beliefs (Jehovah witness) such that the primary modality of treatment has been iron infusions.  She was admitted to the Gen Surgery service to undergo elective open subtotal gastrectomy.  Postop the surgeon requested medical consultation to assist with management of her chronic medical issues.  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 -per primary team (Gen Surgery)  Chronic diastolic heart failure without exacerbation, EF 60-65% on 10/22/15, Chronic atrial fibrillation -CHADVASc 5 - rate not well controlled at present on IV metoprolol, patient currentl -  General surgery to decide the timing of anticoagulation with Coumadin , restarted today, hemoglobin stable, continue to monitor INR and CBC closely  Iron deficiency anemia due to chronic blood loss - refuses transfusion  -related to gastric carcinoma which has now been resected -Hgb stabilizing- Gen Surg is following - agree w/ holding off on full anticoag for now  Agree with PPI  Recent Labs Lab 01/16/16 0330 01/17/16 0510 01/18/16 1016 01/19/16 0748 01/20/16 0704  HGB 11.3* 10.6* 10.3* 10.2* 10.0*    Essential hypertension -BP remains poorly controlled , patient takes atenolol, Lasix, Cozaar,at home -TTE March 2017 with preserved LV function and mild concentric hypertrophy but study was not technically sufficient to evaluate  LV diastolic dysfunction  Chronic kidney disease stage III -Renal function stable   Recent Labs Lab 01/16/16 0330 01/17/16 0510 01/19/16 0748 01/20/16 0704  CREATININE 1.03* 0.87 0.70 0.89    Hypothyroidism Currently on IV dose to 4mcg - TSH 0.851 (at goal) 11/07/2015. Switch back to by mouth Synthroid  50 g when the patient starts  PO  intake  Aortic stenosis, moderate Not hemodynamically significant at present  Protein-calorie malnutrition, moderate  -nutrition per primary team   Hypomagnesemia/Hypokalemia Replete and recheck, r/o c diff if diarrhea continues    Code Status: FULL Family Communication: Discussed with family by the bedside Disposition-as per general surgery  Antibiotics: none  DVT prophylaxis: SQ heparin   Objective: Blood pressure 153/80, pulse 83, temperature 98.3 F (36.8 C), temperature source Oral, resp. rate 18, height 5\' 1"  (1.549 m), weight 73.846 kg (162 lb 12.8 oz), SpO2 91 %.  Intake/Output Summary (Last 24 hours) at 01/20/16 1309 Last data filed at 01/20/16 0730  Gross per 24 hour  Intake 1308.66 ml  Output      0 ml  Net 1308.66 ml    Exam: General: No acute respiratory distress in bed  Lungs: Clear to auscultation bilaterally - no wheeze or crackles Cardiovascular: tachycardic- 2/6 holosystolic M - no gallup or rub  Abdomen: Nondistended, soft, no rebound, no appreciable mass Extremities: No significant cyanosis, clubbing, edema bilateral lower extremities  Data Reviewed: Basic Metabolic Panel:  Recent Labs Lab 01/16/16 0330 01/17/16 0510 01/18/16 1016 01/19/16 0748 01/20/16 0704  NA 141 140  --  139 142  K 4.3 4.6  --  3.1* 3.2*  CL 111 111  --  108 106  CO2 18* 20*  --  24 24  GLUCOSE  176* 132*  --  123* 99  BUN 17 13  --  5* 6  CREATININE 1.03* 0.87  --  0.70 0.89  CALCIUM 8.1* 8.6*  --  8.6* 9.0  MG 1.4*  --  1.7  --   --   PHOS 5.0*  --   --   --   --     CBC:  Recent Labs Lab 01/16/16 0330  01/17/16 0510 01/18/16 1016 01/19/16 0748 01/20/16 0704  WBC 10.8* 11.9* 9.5 7.8 6.0  HGB 11.3* 10.6* 10.3* 10.2* 10.0*  HCT 35.8* 33.7* 33.4* 30.5* 31.0*  MCV 88.6 90.3 90.5 90.0 90.1  PLT 166 173 171 159 193    Liver Function Tests:  Recent Labs Lab 01/18/16 1016 01/19/16 0748  AST 25 20  ALT 17 16  ALKPHOS 70 71  BILITOT 0.9 0.9  PROT 5.6* 5.7*  ALBUMIN 2.5* 2.6*   No results for input(s): LIPASE, AMYLASE in the last 168 hours. No results for input(s): AMMONIA in the last 168 hours.  Coags:  Recent Labs Lab 01/20/16 0704  INR 1.18   No results for input(s): APTT in the last 168 hours.   CBG:  Recent Labs Lab 01/15/16 2009  GLUCAP 183*    Recent Results (from the past 240 hour(s))  MRSA PCR Screening     Status: None   Collection Time: 01/15/16  7:30 PM  Result Value Ref Range Status   MRSA by PCR NEGATIVE NEGATIVE Final    Comment:        The GeneXpert MRSA Assay (FDA approved for NASAL specimens only), is one component of a comprehensive MRSA colonization surveillance program. It is not intended to diagnose MRSA infection nor to guide or monitor treatment for MRSA infections.      Studies:   Recent x-ray studies have been reviewed in detail by the Attending Physician  Scheduled Meds:  Scheduled Meds: . atenolol  100 mg Oral BID  . feeding supplement (ENSURE ENLIVE)  237 mL Oral BID BM  . furosemide  20 mg Oral Daily  . heparin subcutaneous  5,000 Units Subcutaneous Q8H  . levothyroxine  50 mcg Oral QAC breakfast  . loratadine  10 mg Oral Daily  . pantoprazole  40 mg Oral Q1200  . potassium chloride SA  40 mEq Oral BID  . pregabalin  75 mg Oral BID  . sertraline  50 mg Oral Daily  . warfarin  2 mg Oral ONCE-1800  . Warfarin - Pharmacist Dosing Inpatient   Does not apply q1800    Time spent on care of this patient: 50 mins   Reyne Dumas , MD   [facility] Office  [phone] Pager - Text Page per Amion as per  below:  On-Call/Text Page:      [email or web address]      password [email or web address]  If 7PM-7AM, please contact night-coverage [email or web address] Password [email or web address] 01/20/2016, 1:09 PM   LOS: 5 days

## 2016-01-20 NOTE — Discharge Instructions (Addendum)
Information on my medicine - Coumadin®   (Warfarin) ° °This medication education was reviewed with me or my healthcare representative as part of my discharge preparation.   ° °Why was Coumadin prescribed for you? °Coumadin was prescribed for you because you have a blood clot or a medical condition that can cause an increased risk of forming blood clots. Blood clots can cause serious health problems by blocking the flow of blood to the heart, lung, or brain. Coumadin can prevent harmful blood clots from forming. °As a reminder your indication for Coumadin is:   Stroke Prevention Because Of Atrial Fibrillation ° °What test will check on my response to Coumadin? °While on Coumadin (warfarin) you will need to have an INR test regularly to ensure that your dose is keeping you in the desired range. The INR (international normalized ratio) number is calculated from the result of the laboratory test called prothrombin time (PT). ° °If an INR APPOINTMENT HAS NOT ALREADY BEEN MADE FOR YOU please schedule an appointment to have this lab work done by your health care provider within 7 days. °Your INR goal is usually a number between:  2 to 3 or your provider may give you a more narrow range like 2-2.5.  Ask your health care provider during an office visit what your goal INR is. ° °What  do you need to  know  About  COUMADIN? °Take Coumadin (warfarin) exactly as prescribed by your healthcare provider about the same time each day.  DO NOT stop taking without talking to the doctor who prescribed the medication.  Stopping without other blood clot prevention medication to take the place of Coumadin may increase your risk of developing a new clot or stroke.  Get refills before you run out. ° °What do you do if you miss a dose? °If you miss a dose, take it as soon as you remember on the same day then continue your regularly scheduled regimen the next day.  Do not take two doses of Coumadin at the same time. ° °Important Safety  Information °A possible side effect of Coumadin (Warfarin) is an increased risk of bleeding. You should call your healthcare provider right away if you experience any of the following: °? Bleeding from an injury or your nose that does not stop. °? Unusual colored urine (red or dark brown) or unusual colored stools (red or black). °? Unusual bruising for unknown reasons. °? A serious fall or if you hit your head (even if there is no bleeding). ° °Some foods or medicines interact with Coumadin® (warfarin) and might alter your response to warfarin. To help avoid this: °? Eat a balanced diet, maintaining a consistent amount of Vitamin K. °? Notify your provider about major diet changes you plan to make. °? Avoid alcohol or limit your intake to 1 drink for women and 2 drinks for men per day. °(1 drink is 5 oz. wine, 12 oz. beer, or 1.5 oz. liquor.) ° °Make sure that ANY health care provider who prescribes medication for you knows that you are taking Coumadin (warfarin).  Also make sure the healthcare provider who is monitoring your Coumadin knows when you have started a new medication including herbals and non-prescription products. ° °Coumadin® (Warfarin)  Major Drug Interactions  °Increased Warfarin Effect Decreased Warfarin Effect  °Alcohol (large quantities) °Antibiotics (esp. Septra/Bactrim, Flagyl, Cipro) °Amiodarone (Cordarone) °Aspirin (ASA) °Cimetidine (Tagamet) °Megestrol (Megace) °NSAIDs (ibuprofen, naproxen, etc.) °Piroxicam (Feldene) °Propafenone (Rythmol SR) °Propranolol (Inderal) °Isoniazid (INH) °Posaconazole (Noxafil) Barbiturates (Phenobarbital) °  Carbamazepine (Tegretol) Chlordiazepoxide (Librium) Cholestyramine (Questran) Griseofulvin Oral Contraceptives Rifampin Sucralfate (Carafate) Vitamin K   Coumadin (Warfarin) Major Herbal Interactions  Increased Warfarin Effect Decreased Warfarin Effect  Garlic Ginseng Ginkgo biloba Coenzyme Q10 Green tea St. Johns wort    Coumadin (Warfarin)  FOOD Interactions  Eat a consistent number of servings per week of foods HIGH in Vitamin K (1 serving =  cup)  Collards (cooked, or boiled & drained) Kale (cooked, or boiled & drained) Mustard greens (cooked, or boiled & drained) Parsley *serving size only =  cup Spinach (cooked, or boiled & drained) Swiss chard (cooked, or boiled & drained) Turnip greens (cooked, or boiled & drained)  Eat a consistent number of servings per week of foods MEDIUM-HIGH in Vitamin K (1 serving = 1 cup)  Asparagus (cooked, or boiled & drained) Broccoli (cooked, boiled & drained, or raw & chopped) Brussel sprouts (cooked, or boiled & drained) *serving size only =  cup Lettuce, raw (green leaf, endive, romaine) Spinach, raw Turnip greens, raw & chopped   These websites have more information on Coumadin (warfarin):  FailFactory.se; VeganReport.com.au;   [facility], [place] [phone]  ABDOMINAL SURGERY: POST OP INSTRUCTIONS  Always review your discharge instruction sheet given to you by the facility where your surgery was performed.  IF YOU HAVE DISABILITY OR FAMILY LEAVE FORMS, YOU MUST BRING THEM TO THE OFFICE FOR PROCESSING.  PLEASE DO NOT GIVE THEM TO YOUR DOCTOR.  1. A prescription for pain medication may be given to you upon discharge.  Take your pain medication as prescribed, if needed.  If narcotic pain medicine is not needed, then you may take acetaminophen (Tylenol) or ibuprofen (Advil) as needed. 2. Take your usually prescribed medications unless otherwise directed. 3. If you need a refill on your pain medication, please contact your pharmacy. They will contact our office to request authorization.  Prescriptions will not be filled after 5pm or on week-ends. 4. You should follow a light diet the first few days after arrival home, such as soup and crackers, pudding, etc.unless your doctor has advised otherwise. A high-fiber, low fat diet can be resumed as  tolerated.   Be sure to include lots of fluids daily. Most patients will experience some swelling and bruising on the chest and neck area.  Ice packs will help.  Swelling and bruising can take several days to resolve 5. Most patients will experience some swelling and bruising in the area of the incision. Ice pack will help. Swelling and bruising can take several days to resolve..  6. It is common to experience some constipation if taking pain medication after surgery.  Increasing fluid intake and taking a stool softener will usually help or prevent this problem from occurring.  A mild laxative (Milk of Magnesia or Miralax) should be taken according to package directions if there are no bowel movements after 48 hours. 7.  You may have steri-strips (small skin tapes) in place directly over the incision.  These strips should be left on the skin for 10-14 days.  If your surgeon used skin glue on the incision, you may shower in 48 hours.  The glue will flake off over the next 2-3 weeks.  Any sutures or staples will be removed at the office during your follow-up visit. You may find that a light gauze bandage over your incision may keep your staples from being rubbed or pulled. You may shower and replace the bandage daily. 8. ACTIVITIES:  You may resume regular (light) daily activities beginning the next day--such as daily self-care, walking, climbing stairs--gradually increasing activities as tolerated.  You may have sexual intercourse when it is comfortable.  Refrain from any heavy lifting or straining until approved by your doctor. a. You may drive when you no longer are taking prescription pain medication, you can comfortably wear a seatbelt, and you can safely maneuver your car and apply brakes b. Return to Work: __________8 weeks if applicable_________________________ 40. You should see your doctor in the office for a follow-up appointment approximately two weeks after your surgery.  Make sure that you call for  this appointment within a day or two after you arrive home to insure a convenient appointment time. OTHER INSTRUCTIONS:  _____________________________________________________________ _____________________________________________________________  WHEN TO CALL YOUR DOCTOR: 1. Fever over 101.0 2. Inability to urinate 3. Nausea and/or vomiting 4. Extreme swelling or bruising 5. Continued bleeding from incision. 6. Increased pain, redness, or drainage from the incision. 7. Difficulty swallowing or breathing 8. Muscle cramping or spasms. 9. Numbness or tingling in hands or feet or around lips.  The clinic staff is available to answer your questions during regular business hours.  Please dont hesitate to call and ask to speak to one of the nurses if you have concerns.  For further questions, please visit [email or web address]

## 2016-01-20 NOTE — Care Management Important Message (Signed)
Important Message  Patient Details  Name: [name] MRN: [id number] Date of Birth: 1933-12-21   Medicare Important Message Given:  Yes    Sharin Mons, RN 01/20/2016, 11:58 AM

## 2016-01-20 NOTE — Progress Notes (Signed)
Physical Therapy Treatment Patient Details Name: [name] MRN: [id number] DOB: 1934/02/18 Today's Date: 01/20/2016    History of Present Illness 82 y.o. female with history of CAD, tachy-brady s/p pacer, Afib, Stage 3 CKD, HTN, moderate AoS, hypothyroidism, and iron deficiency anemia secondary to gastric cancer now s/p open partial gastrectomy.      PT Comments    Patient is progressing toward mobility goals. Patient needs to practice stairs next session.   Current plan remains appropriate.   Follow Up Recommendations  Home health PT     Equipment Recommendations  None recommended by PT    Recommendations for Other Services       Precautions / Restrictions Precautions Precautions: Fall Precaution Comments: NGT,  Restrictions Weight Bearing Restrictions: No    Mobility  Bed Mobility               General bed mobility comments: OOB in chair upon arrival  Transfers Overall transfer level: Needs assistance Equipment used: Rolling walker (2 wheeled) Transfers: Sit to/from Stand Sit to Stand: Min guard         General transfer comment: from recliner; cues for hand placement and min guard for safety; cues for slow descent to chair   Ambulation/Gait Ambulation/Gait assistance: Min guard Ambulation Distance (Feet): 60 Feet Assistive device: Rolling walker (2 wheeled) Gait Pattern/deviations: Step-through pattern;Decreased stride length;Trunk flexed     General Gait Details: cues for position of RW, posture, and forward gaze; pt c/o fatigue at ~62ft and needed standing rest break; VSS   Stairs            Wheelchair Mobility    Modified Rankin (Stroke Patients Only)       Balance Overall balance assessment: Needs assistance Sitting-balance support: No upper extremity supported;Feet supported Sitting balance-Leahy Scale: Good     Standing balance support: Single extremity supported Standing balance-Leahy Scale: Poor Standing balance  comment: static stand at sink with single UE support                    Cognition Arousal/Alertness: Awake/alert Behavior During Therapy: WFL for tasks assessed/performed Overall Cognitive Status: Within Functional Limits for tasks assessed                      Exercises General Exercises - Lower Extremity Ankle Circles/Pumps: AROM;Both;20 reps;Seated Long Arc Quad: AROM;Both;20 reps;Seated Hip Flexion/Marching: AROM;Both;20 reps;Standing Mini-Sqauts: AROM;20 reps;Standing    General Comments General comments (skin integrity, edema, etc.): duagther present for session and very supportive      Pertinent Vitals/Pain Pain Assessment: Faces Faces Pain Scale: Hurts a little bit Pain Location: abdomen Pain Descriptors / Indicators: Sore Pain Intervention(s): Monitored during session;Premedicated before session;Repositioned    Home Living                      Prior Function            PT Goals (current goals can now be found in the care plan section) Acute Rehab PT Goals Patient Stated Goal: go home Progress towards PT goals: Progressing toward goals    Frequency  Min 3X/week    PT Plan Current plan remains appropriate    Co-evaluation             End of Session Equipment Utilized During Treatment: Gait belt Activity Tolerance: Patient tolerated treatment well Patient left: in chair;with call bell/phone within reach;with chair alarm set     Time: 1036-1100 PT  Time Calculation (min) (ACUTE ONLY): 24 min  Charges:  $Gait Training: 8-22 mins $Therapeutic Exercise: 8-22 mins                    G Codes:      Salina April, PTA Pager: [phone]   01/20/2016, 12:17 PM

## 2016-01-21 LAB — COMPREHENSIVE METABOLIC PANEL
ALK PHOS: 75 U/L (ref 38–126)
ALT: 13 U/L — AB (ref 14–54)
ANION GAP: 12 (ref 5–15)
AST: 15 U/L (ref 15–41)
Albumin: 2.7 g/dL — ABNORMAL LOW (ref 3.5–5.0)
BUN: 8 mg/dL (ref 6–20)
CHLORIDE: 112 mmol/L — AB (ref 101–111)
CO2: 21 mmol/L — ABNORMAL LOW (ref 22–32)
Calcium: 9 mg/dL (ref 8.9–10.3)
Creatinine, Ser: 0.95 mg/dL (ref 0.44–1.00)
GFR, EST NON AFRICAN AMERICAN: 55 mL/min — AB (ref >60)
Glucose, Bld: 99 mg/dL (ref 65–99)
POTASSIUM: 4.6 mmol/L (ref 3.5–5.1)
Sodium: 145 mmol/L (ref 135–145)
Total Bilirubin: 0.7 mg/dL (ref 0.3–1.2)
Total Protein: 5.4 g/dL — ABNORMAL LOW (ref 6.5–8.1)

## 2016-01-21 LAB — CBC
HEMATOCRIT: 31.2 % — AB (ref 36.0–46.0)
HEMOGLOBIN: 10.1 g/dL — AB (ref 12.0–15.0)
MCH: 29.5 pg (ref 26.0–34.0)
MCHC: 32.4 g/dL (ref 30.0–36.0)
MCV: 91.2 fL (ref 78.0–100.0)
PLATELETS: 188 10*3/uL (ref 150–400)
RBC: 3.42 MIL/uL — AB (ref 3.87–5.11)
RDW: 18.3 % — ABNORMAL HIGH (ref 11.5–15.5)
WBC: 5.3 10*3/uL (ref 4.0–10.5)

## 2016-01-21 LAB — PROTIME-INR
INR: 1.23 (ref 0.00–1.49)
Prothrombin Time: 15.7 seconds — ABNORMAL HIGH (ref 11.6–15.2)

## 2016-01-21 LAB — MAGNESIUM: MAGNESIUM: 1.6 mg/dL — AB (ref 1.7–2.4)

## 2016-01-21 MED ORDER — ALUM & MAG HYDROXIDE-SIMETH 200-200-20 MG/5ML PO SUSP
30.0000 mL | Freq: Four times a day (QID) | ORAL | Status: DC | PRN
  Administered 2016-01-21 – 2016-01-22 (×4): 30 mL via ORAL
  Filled 2016-01-21 (×4): qty 30

## 2016-01-21 MED ORDER — PRO-STAT SUGAR FREE PO LIQD
30.0000 mL | Freq: Three times a day (TID) | ORAL | Status: DC
  Administered 2016-01-21: 30 mL via ORAL
  Filled 2016-01-21 (×5): qty 30

## 2016-01-21 MED ORDER — WARFARIN SODIUM 3 MG PO TABS
3.0000 mg | ORAL_TABLET | Freq: Once | ORAL | Status: AC
  Administered 2016-01-21: 3 mg via ORAL
  Filled 2016-01-21: qty 1

## 2016-01-21 MED ORDER — MAGNESIUM OXIDE 400 (241.3 MG) MG PO TABS
400.0000 mg | ORAL_TABLET | Freq: Two times a day (BID) | ORAL | Status: DC
  Administered 2016-01-21 – 2016-01-26 (×11): 400 mg via ORAL
  Filled 2016-01-21 (×11): qty 1

## 2016-01-21 NOTE — Progress Notes (Signed)
Brief Nutrition Follow-Up Note  RD received consult for diet education regarding post-gastrectomy diet. Per [name], she would like to pt consume a full liquid/soft diet for two weeks.   Spoke with pt at bedside. She continues to complain of indigestion with eating and poor appetite. She consumed only a few bites of eggs this AM. Pt reports she likes the Ensure supplement, but "it was too much" for her. Pt daughter reports that pt was constantly using the bedpan yesterday afternoon.   RD provided "Gastric Surgery Nutrition Therapy" handout from the [employer]. Reviewed patient's dietary recall. Provided examples on ways to increase caloric density of foods and beverages frequently consumed by the patient. Also provided ideas to promote variety and to incorporate additional nutrient dense foods into patient's diet. Discussed eating small frequent meals and snacks to assist in increasing overall po intake.   Provided examples on ways to decrease fiber intake in diet. Discouraged fresh fruits and vegetables as well as whole grain sources of carbohydrates to minimize fiber intake. Discussed examples of ways to modify diet and commonly eaten foods to decreased fiber content in diet.   RD discussed why it is important for patient to adhere to diet recommendations, and emphasized foods to avoid, and importance of consuming a high protein food source with every meal. Teach back method used.  Expect very good compliance.  Pt advanced to a soft diet today.   RD will d/c Ensure Enlive po BID, each supplement provides 350 kcal and 20 grams of protein. Will try 30 ml Prostat TID, each supplement provides 100 kcals and 15 grams protein.   RD will continue to follow.   Windel Keziah A. Jimmye Norman, RD, LDN, CDE Pager: [phone] After hours Pager: [phone]

## 2016-01-21 NOTE — Progress Notes (Addendum)
[place] TEAM 1 - Stepdown/ICU TEAM CONSULT F/U NOTE  [name] [id number] DOB: [birth year] DOA: 01/15/2016 PCP: [name], MD   Admit HPI / Brief Narrative: 82 y.o. female with history of CAD, tachy-brady s/p pacer, Afib, Stage 3 CKD, HTN, moderate AoS, hypothyroidism, and iron deficiency anemia secondary to gastric cancer with a chronically bleeding polypoid lesion of the stomach complicated by patient's religious beliefs (Jehovah witness) such that the primary modality of treatment has been iron infusions.  She was admitted to the Gen Surgery service to undergo elective open subtotal gastrectomy.  Postop the surgeon requested medical consultation to assist with management of her chronic medical issues.  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 -per primary team (Gen Surgery)  Chronic diastolic heart failure without exacerbation, EF 60-65% on 10/22/15, Chronic atrial fibrillation -CHADVASc 5 - rate   well controlled  on IV metoprolol prn and   atenolol  -  General surgery restarted Coumadin, hemoglobin stable, continue to monitor INR and CBC closely, pharmacy dosing coumadin without bridge    Iron deficiency anemia due to chronic blood loss - refuses transfusion  -related to gastric carcinoma which has now been resected -Hgb stable for 2-3 days now- Gen Surg is following - anticoagulation has been resumed  Recent Labs Lab 01/17/16 0510 01/18/16 1016 01/19/16 0748 01/20/16 0704 01/21/16 0625  HGB 10.6* 10.3* 10.2* 10.0* 10.1*    Essential hypertension -BP remains poorly controlled , patient takes atenolol, Lasix, Cozaar,at home, Cozaar still on hold -TTE March 2017 with preserved LV function and mild concentric hypertrophy but study was not technically sufficient to evaluate LV diastolic dysfunction  Chronic  kidney disease stage III -Renal function stable   Recent Labs Lab 01/16/16 0330 01/17/16 0510 01/19/16 0748 01/20/16 0704 01/21/16 0625  CREATININE 1.03* 0.87 0.70 0.89 0.95    Hypothyroidism Continue Synthroid - TSH 0.851 (at goal) 11/07/2015.   Aortic stenosis, moderate Not hemodynamically significant at present  Protein-calorie malnutrition, moderate  -nutrition per primary team   Hypomagnesemia/Hypokalemia Replete and recheck, r/o c diff if diarrhea continues    Code Status: FULL Family Communication: Discussed with family by the bedside Disposition-as per general surgery, TRH will sign off   Antibiotics: none  DVT prophylaxis: SQ heparin   Objective: Blood pressure 148/92, pulse 86, temperature 98.7 F (37.1 C), temperature source Oral, resp. rate 16, height 5\' 1"  (1.549 m), weight 73.846 kg (162 lb 12.8 oz), SpO2 97 %.  Intake/Output Summary (Last 24 hours) at 01/21/16 1308 Last data filed at 01/21/16 1105  Gross per 24 hour  Intake 2326.83 ml  Output      0 ml  Net 2326.83 ml    Exam: General: No acute respiratory distress in bed  Lungs: Clear to auscultation bilaterally - no wheeze or crackles Cardiovascular: tachycardic- 2/6 holosystolic M - no gallup or rub  Abdomen: Nondistended, soft, no rebound, no appreciable mass Extremities: No significant cyanosis, clubbing, edema bilateral lower extremities  Data Reviewed: Basic Metabolic Panel:  Recent Labs Lab 01/16/16 0330 01/17/16 0510 01/18/16 1016 01/19/16 0748 01/20/16 0704 01/21/16 0625  NA 141 140  --  139 142 145  K 4.3 4.6  --  3.1* 3.2* 4.6  CL 111 111  --  108 106 112*  CO2 18* 20*  --  24 24 21*  GLUCOSE 176* 132*  --  123* 99 99  BUN 17 13  --  5* 6 8  CREATININE 1.03* 0.87  --  0.70 0.89 0.95  CALCIUM 8.1* 8.6*  --  8.6* 9.0 9.0  MG 1.4*  --  1.7  --   --  1.6*  PHOS 5.0*  --   --   --   --   --     CBC:  Recent Labs Lab 01/17/16 0510 01/18/16 1016 01/19/16 0748  01/20/16 0704 01/21/16 0625  WBC 11.9* 9.5 7.8 6.0 5.3  HGB 10.6* 10.3* 10.2* 10.0* 10.1*  HCT 33.7* 33.4* 30.5* 31.0* 31.2*  MCV 90.3 90.5 90.0 90.1 91.2  PLT 173 171 159 193 188    Liver Function Tests:  Recent Labs Lab 01/18/16 1016 01/19/16 0748 01/21/16 0625  AST 25 20 15   ALT 17 16 13*  ALKPHOS 70 71 75  BILITOT 0.9 0.9 0.7  PROT 5.6* 5.7* 5.4*  ALBUMIN 2.5* 2.6* 2.7*   No results for input(s): LIPASE, AMYLASE in the last 168 hours. No results for input(s): AMMONIA in the last 168 hours.  Coags:  Recent Labs Lab 01/20/16 0704 01/21/16 0625  INR 1.18 1.23   No results for input(s): APTT in the last 168 hours.   CBG:  Recent Labs Lab 01/15/16 2009  GLUCAP 183*    Recent Results (from the past 240 hour(s))  MRSA PCR Screening     Status: None   Collection Time: 01/15/16  7:30 PM  Result Value Ref Range Status   MRSA by PCR NEGATIVE NEGATIVE Final    Comment:        The GeneXpert MRSA Assay (FDA approved for NASAL specimens only), is one component of a comprehensive MRSA colonization surveillance program. It is not intended to diagnose MRSA infection nor to guide or monitor treatment for MRSA infections.      Studies:   Recent x-ray studies have been reviewed in detail by the Attending Physician  Scheduled Meds:  Scheduled Meds: . atenolol  100 mg Oral BID  . feeding supplement (ENSURE ENLIVE)  237 mL Oral BID BM  . furosemide  20 mg Oral Daily  . heparin subcutaneous  5,000 Units Subcutaneous Q8H  . levothyroxine  50 mcg Oral QAC breakfast  . loratadine  10 mg Oral Daily  . magnesium oxide  400 mg Oral BID  . pantoprazole  40 mg Oral Q1200  . pregabalin  75 mg Oral BID  . sertraline  50 mg Oral Daily  . warfarin  3 mg Oral ONCE-1800  . Warfarin - Pharmacist Dosing Inpatient   Does not apply q1800    Time spent on care of this patient: 26 mins   Reyne Dumas , MD   [facility] Office  [phone] Pager - Text Page  per Amion as per below:  On-Call/Text Page:      [email or web address]      password [email or web address]  If 7PM-7AM, please contact night-coverage [email or web address] Password [email or web address] 01/21/2016, 1:08 PM   LOS: 6 days

## 2016-01-21 NOTE — Progress Notes (Signed)
ANTICOAGULATION CONSULT NOTE - Initial Consult  Pharmacy Consult for coumadin Indication: atrial fibrillation  Allergies  Allergen Reactions  . Amiodarone      Thyroid and liver and kidney problems  . Codeine Nausea And Vomiting  . Gabapentin Itching  . Hydrocodone Other (See Comments)    hallucination  . Morphine And Related Nausea And Vomiting  . Requip [Ropinirole Hcl]     Headache   . Zinc     nausea    Patient Measurements: Height: 5\' 1"  (154.9 cm) Weight: 162 lb 12.8 oz (73.846 kg) IBW/kg (Calculated) : 47.8  Vital Signs: Temp: 98.7 F (37.1 C) (05/17 0506) BP: 148/92 mmHg (05/17 0506) Pulse Rate: 86 (05/17 0506)  Labs:  Recent Labs  01/19/16 0748 01/20/16 0704 01/21/16 0625  HGB 10.2* 10.0* 10.1*  HCT 30.5* 31.0* 31.2*  PLT 159 193 188  LABPROT  --  15.2 15.7*  INR  --  1.18 1.23  CREATININE 0.70 0.89 0.95    Estimated Creatinine Clearance: 44.1 mL/min (by C-G formula based on Cr of 0.95).  Assessment: 72 YOF admitted with upper GIB with gastric cancer, s/p open partial gastrectomy. Resumed coumadin yesterday INR 1.18 > 1.23. She is on sq heparin for VTE prophylaxis. CBC stable with hgb = 10.1, pltc = 188K, stable   She was previously on 2mg  daily except for 3 mg on MWF per coumadin clinic note  Goal of Therapy:  INR 2-3 Monitor platelets by anticoagulation protocol: Yes   Plan:  - Coumadin 3mg  po x 1 - Daily PT/INR - Monitor CBC closely - Continue sq heparin until INR is near therapeutic range.  [name], PharmD, BCPS  Clinical Pharmacist  Pager: [phone]   01/21/2016,11:45 AM

## 2016-01-21 NOTE — Progress Notes (Signed)
Patient ID: [name], female   DOB: 07-14-1934, 82 y.o.   MRN: [id number] 6 Days Post-Op   Subjective: Pt is doing well.  No nausea with full liquids.    Objective: Vital signs in last 24 hours: Temp:  [97.8 F (36.6 C)-98.7 F (37.1 C)] 98.7 F (37.1 C) (05/17 0506) Pulse Rate:  [78-86] 86 (05/17 0506) Resp:  [16] 16 (05/17 0506) BP: (131-170)/(71-96) 148/92 mmHg (05/17 0506) SpO2:  [97 %] 97 % (05/17 0506) Last BM Date: 01/19/16  Intake/Output from previous day: 05/16 0701 - 05/17 0700 In: 2384.8 [P.O.:1018; I.V.:1366.8] Out: -  Intake/Output this shift:    General appearance: Alert.  Cooperative.  Mental status reasonably normal.  No distress. Resp: breathing comfortably Heart: Irregularly irregular. GI: Incision clean.  Honeycomb dressing in place.  Abdomen soft.  Hypoactive bowel sounds.  Minimally tender. [place] working.    Lab Results:  Results for orders placed or performed during the hospital encounter of 01/15/16 (from the past 24 hour(s))  CBC     Status: Abnormal   Collection Time: 01/21/16  6:25 AM  Result Value Ref Range   WBC 5.3 4.0 - 10.5 K/uL   RBC 3.42 (L) 3.87 - 5.11 MIL/uL   Hemoglobin 10.1 (L) 12.0 - 15.0 g/dL   HCT 31.2 (L) 36.0 - 46.0 %   MCV 91.2 78.0 - 100.0 fL   MCH 29.5 26.0 - 34.0 pg   MCHC 32.4 30.0 - 36.0 g/dL   RDW 18.3 (H) 11.5 - 15.5 %   Platelets 188 150 - 400 K/uL     Studies/Results: No results found.  [name] atenolol  100 mg Oral BID  . feeding supplement (ENSURE ENLIVE)  237 mL Oral BID BM  . furosemide  20 mg Oral Daily  . heparin subcutaneous  5,000 Units Subcutaneous Q8H  . levothyroxine  50 mcg Oral QAC breakfast  . loratadine  10 mg Oral Daily  . pantoprazole  40 mg Oral Q1200  . potassium chloride SA  40 mEq Oral BID  . pregabalin  75 mg Oral BID  . sertraline  50 mg Oral Daily  . Warfarin - Pharmacist Dosing Inpatient   Does not apply q1800     Assessment/Plan: s/p Procedure(s): LAPAROSCOPY DIAGNOSTIC OPEN  PARTIAL GASTRECTOMY  POD #6.  Open partial gastrectomy for gastric neoplasm Await pathology Advance to soft solids. Nutrition consult.   VTE prophylaxis.  On subcutaneous heparin  Chronic atrial fibrillation on chronic Coumadin.  Hgb/HCT stable.  Restarted coumadin yesterday.      LOS: 6 days    [name] 01/21/2016  . .prob

## 2016-01-22 ENCOUNTER — Encounter: Payer: Self-pay | Admitting: Genetic Counselor

## 2016-01-22 ENCOUNTER — Other Ambulatory Visit: Payer: Self-pay

## 2016-01-22 LAB — CBC
HEMATOCRIT: 33.2 % — AB (ref 36.0–46.0)
HEMOGLOBIN: 10.5 g/dL — AB (ref 12.0–15.0)
MCH: 28.8 pg (ref 26.0–34.0)
MCHC: 31.6 g/dL (ref 30.0–36.0)
MCV: 91 fL (ref 78.0–100.0)
PLATELETS: 204 10*3/uL (ref 150–400)
RBC: 3.65 MIL/uL — AB (ref 3.87–5.11)
RDW: 18.7 % — ABNORMAL HIGH (ref 11.5–15.5)
WBC: 5.9 10*3/uL (ref 4.0–10.5)

## 2016-01-22 LAB — COMPREHENSIVE METABOLIC PANEL
ALBUMIN: 2.7 g/dL — AB (ref 3.5–5.0)
ALT: 13 U/L — AB (ref 14–54)
ANION GAP: 10 (ref 5–15)
AST: 16 U/L (ref 15–41)
Alkaline Phosphatase: 79 U/L (ref 38–126)
BUN: 9 mg/dL (ref 6–20)
CHLORIDE: 108 mmol/L (ref 101–111)
CO2: 24 mmol/L (ref 22–32)
CREATININE: 1.01 mg/dL — AB (ref 0.44–1.00)
Calcium: 8.8 mg/dL — ABNORMAL LOW (ref 8.9–10.3)
GFR, EST AFRICAN AMERICAN: 60 mL/min — AB (ref >60)
GFR, EST NON AFRICAN AMERICAN: 52 mL/min — AB (ref >60)
Glucose, Bld: 94 mg/dL (ref 65–99)
POTASSIUM: 4.4 mmol/L (ref 3.5–5.1)
Sodium: 142 mmol/L (ref 135–145)
Total Bilirubin: 0.4 mg/dL (ref 0.3–1.2)
Total Protein: 5.9 g/dL — ABNORMAL LOW (ref 6.5–8.1)

## 2016-01-22 LAB — PROTIME-INR
INR: 1.5 — ABNORMAL HIGH (ref 0.00–1.49)
Prothrombin Time: 18.2 seconds — ABNORMAL HIGH (ref 11.6–15.2)

## 2016-01-22 MED ORDER — WARFARIN SODIUM 3 MG PO TABS
3.0000 mg | ORAL_TABLET | Freq: Once | ORAL | Status: AC
  Administered 2016-01-22: 3 mg via ORAL
  Filled 2016-01-22: qty 1

## 2016-01-22 MED ORDER — DIPHENOXYLATE-ATROPINE 2.5-0.025 MG PO TABS
1.0000 | ORAL_TABLET | Freq: Four times a day (QID) | ORAL | Status: DC
  Administered 2016-01-22 – 2016-01-26 (×16): 1 via ORAL
  Filled 2016-01-22 (×16): qty 1

## 2016-01-22 NOTE — Progress Notes (Signed)
Physical Therapy Treatment Patient Details Name: [name] MRN: [id number] DOB: 1933/12/13 Today's Date: 01/22/2016    History of Present Illness 82 y.o. female with history of CAD, tachy-brady s/p pacer, Afib, Stage 3 CKD, HTN, moderate AoS, hypothyroidism, and iron deficiency anemia secondary to gastric cancer now s/p open partial gastrectomy.      PT Comments    Patient is making progress toward mobility goals with demonstrated  increased activity tolerance this session. Stair training next session. Current plan remains appropriate.   Follow Up Recommendations  Home health PT     Equipment Recommendations  None recommended by PT    Recommendations for Other Services       Precautions / Restrictions Precautions Precautions: Fall Precaution Comments: NGT,  Restrictions Weight Bearing Restrictions: No    Mobility  Bed Mobility               General bed mobility comments: OOB in chair upon arrival  Transfers Overall transfer level: Needs assistance Equipment used: Rolling walker (2 wheeled) Transfers: Sit to/from Stand Sit to Stand: Supervision         General transfer comment: safe hand placement; supervision for safety  Ambulation/Gait Ambulation/Gait assistance: Min guard Ambulation Distance (Feet): 180 Feet Assistive device: Rolling walker (2 wheeled) Gait Pattern/deviations: Step-through pattern;Decreased stride length;Trunk flexed     General Gait Details: cues for posture; one standing rest break required; safe use of AD   Stairs            Wheelchair Mobility    Modified Rankin (Stroke Patients Only)       Balance     Sitting balance-Leahy Scale: Good     Standing balance support: Bilateral upper extremity supported Standing balance-Leahy Scale: Fair                      Cognition Arousal/Alertness: Awake/alert Behavior During Therapy: WFL for tasks assessed/performed Overall Cognitive Status: Within  Functional Limits for tasks assessed                      Exercises General Exercises - Lower Extremity Long Arc Quad: Both;20 reps;Seated;Strengthening Hip ABduction/ADduction: Strengthening;Both;20 reps;Seated Hip Flexion/Marching: AROM;Both;20 reps;Standing Heel Raises: Strengthening;Both;20 reps;Standing Mini-Sqauts: AROM;20 reps;Standing    General Comments        Pertinent Vitals/Pain Pain Assessment: No/denies pain    Home Living                      Prior Function            PT Goals (current goals can now be found in the care plan section) Acute Rehab PT Goals Patient Stated Goal: go home Progress towards PT goals: Progressing toward goals    Frequency  Min 3X/week    PT Plan Current plan remains appropriate    Co-evaluation             End of Session Equipment Utilized During Treatment: Gait belt Activity Tolerance: Patient tolerated treatment well Patient left: in chair;with call bell/phone within reach;with chair alarm set;with family/visitor present     Time: [id number] PT Time Calculation (min) (ACUTE ONLY): 25 min  Charges:  $Gait Training: 8-22 mins $Therapeutic Exercise: 8-22 mins                    G Codes:      Salina April, PTA Pager: [phone]   01/22/2016, 4:12 PM

## 2016-01-22 NOTE — Progress Notes (Signed)
UR COMPLETED  

## 2016-01-22 NOTE — Progress Notes (Signed)
[facility] for coumadin Indication: atrial fibrillation  Allergies  Allergen Reactions  . Amiodarone      Thyroid and liver and kidney problems  . Codeine Nausea And Vomiting  . Gabapentin Itching  . Hydrocodone Other (See Comments)    hallucination  . Morphine And Related Nausea And Vomiting  . Requip [Ropinirole Hcl]     Headache   . Zinc     nausea    Patient Measurements: Height: 5\' 1"  (154.9 cm) Weight: 162 lb 12.8 oz (73.846 kg) IBW/kg (Calculated) : 47.8  Vital Signs: Temp: 97.7 F (36.5 C) (05/18 0516) Temp Source: Oral (05/18 0516) BP: 157/84 mmHg (05/18 0516) Pulse Rate: 86 (05/18 0516)  Labs:  Recent Labs  01/20/16 0704 01/21/16 0625 01/22/16 0506  HGB 10.0* 10.1* 10.5*  HCT 31.0* 31.2* 33.2*  PLT 193 188 204  LABPROT 15.2 15.7* 18.2*  INR 1.18 1.23 1.50*  CREATININE 0.89 0.95 1.01*    Estimated Creatinine Clearance: 41.5 mL/min (by C-G formula based on Cr of 1.01).  Assessment: 8 YOF admitted with upper GIB with gastric cancer, s/p open partial gastrectomy. Resumed coumadin yesterday INR 1.23 > 1.5. She is on sq heparin for VTE prophylaxis. CBC stable with hgb = 10.5, pltc = 204K, stable   She was previously on 2mg  daily except for 3 mg on MWF per coumadin clinic note  Goal of Therapy:  INR 2-3 Monitor platelets by anticoagulation protocol: Yes   Plan:  - Coumadin 3mg  po x 1 - Daily PT/INR - Monitor CBC closely - Continue sq heparin until INR is near therapeutic range.  Vincenza Hews, PharmD, BCPS 01/22/2016, 8:18 AM Pager: [phone]

## 2016-01-23 ENCOUNTER — Inpatient Hospital Stay (HOSPITAL_COMMUNITY): Payer: Medicare Other

## 2016-01-23 LAB — PROTIME-INR
INR: 2.03 — ABNORMAL HIGH (ref 0.00–1.49)
Prothrombin Time: 22.8 seconds — ABNORMAL HIGH (ref 11.6–15.2)

## 2016-01-23 MED ORDER — DIATRIZOATE MEGLUMINE & SODIUM 66-10 % PO SOLN
ORAL | Status: AC
Start: 2016-01-23 — End: 2016-01-23
  Administered 2016-01-23: 08:00:00
  Filled 2016-01-23: qty 90

## 2016-01-23 MED ORDER — IOPAMIDOL (ISOVUE-300) INJECTION 61%
INTRAVENOUS | Status: AC
  Administered 2016-01-23: 08:00:00
  Administered 2016-01-23: 20 mL via ORAL
  Filled 2016-01-23: qty 150

## 2016-01-23 MED ORDER — SUCRALFATE 1 GM/10ML PO SUSP
1.0000 g | Freq: Three times a day (TID) | ORAL | Status: DC
  Administered 2016-01-23 – 2016-01-26 (×13): 1 g via ORAL
  Filled 2016-01-23 (×13): qty 10

## 2016-01-23 MED ORDER — DIATRIZOATE MEGLUMINE & SODIUM 66-10 % PO SOLN
90.0000 mL | Freq: Once | ORAL | Status: AC
  Administered 2016-01-23: 90 mL via ORAL
  Filled 2016-01-23: qty 90

## 2016-01-23 MED ORDER — WARFARIN SODIUM 2 MG PO TABS
2.0000 mg | ORAL_TABLET | Freq: Once | ORAL | Status: AC
  Administered 2016-01-23: 2 mg via ORAL
  Filled 2016-01-23: qty 1

## 2016-01-23 NOTE — Progress Notes (Signed)
Patient ID: [name], female   DOB: 1934-05-19, 82 y.o.   MRN: [id number] Patient ID: [name], female   DOB: 12-13-1933, 82 y.o.   MRN: [id number] 8 Days Post-Op   Subjective: Pt is doing well.  No nausea with full liquids.    Objective: Vital signs in last 24 hours: Temp:  [98 F (36.7 C)-99.3 F (37.4 C)] 98 F (36.7 C) (05/19 0601) Pulse Rate:  [73-83] 77 (05/19 0934) Resp:  [16] 16 (05/19 0601) BP: (132-153)/(68-79) 132/68 mmHg (05/19 0934) SpO2:  [95 %-99 %] 95 % (05/19 0601) Last BM Date: 01/21/16  Intake/Output from previous day:   Intake/Output this shift:    General appearance: Alert.  Cooperative.  Mental status reasonably normal.  No distress. Resp: breathing comfortably Heart: Irregularly irregular. GI: Incision clean.  Honeycomb dressing in place.  Abdomen soft.  Hypoactive bowel sounds.  Minimally tender. [place] working.    Lab Results:  Results for orders placed or performed during the hospital encounter of 01/15/16 (from the past 24 hour(s))  Protime-INR     Status: Abnormal   Collection Time: 01/23/16  9:20 AM  Result Value Ref Range   Prothrombin Time 22.8 (H) 11.6 - 15.2 seconds   INR 2.03 (H) 0.00 - 1.49     Studies/Results: Dg Ugi W/water Sol Cm  01/23/2016  CLINICAL DATA:  Status post partial gastrectomy.  Evaluate for leak. EXAM: WATER SOLUBLE UPPER GI SERIES TECHNIQUE: Single-column upper GI series was performed using water soluble contrast. CONTRAST:  1 ISOVUE-300 IOPAMIDOL (ISOVUE-300) INJECTION 61% COMPARISON:  01/18/2016 FLUOROSCOPY TIME:  Radiation Exposure Index (as provided by the fluoroscopic device): Not available If the device does not provide the exposure index: Fluoroscopy Time (in minutes and seconds):  1 minutes 18 seconds Number of Acquired Images:  1, the rest saved fluoroscopy FINDINGS: Scout images over the chest epigastrium were obtained. Patient had to take swallows LPO. In this position there was no emptying of the remnant  stomach with frequent large volume gastroesophageal reflux. Edema and obstruction at the gastro enterostomy with expected, but when supine and RPO the stomach drained readily. Negative for leak. The duodenal C-loop opacified and is unremarkable. IMPRESSION: 1. Negative for postoperative leak or obstruction. 2. Positional drainage of the remnant stomach. When LPO there was no drainage and large volume, symptomatic gastroesophageal reflux. 3. Distal esophagitis related to #2 or recent NG tube. Electronically Signed   By: Monte Fantasia M.D.   On: 01/23/2016 09:49    . atenolol  100 mg Oral BID  . diatrizoate meglumine-sodium  90 mL Oral Once  . diatrizoate meglumine-sodium      . diphenoxylate-atropine  1 tablet Oral QID  . feeding supplement (PRO-STAT SUGAR FREE 64)  30 mL Oral TID BM  . furosemide  20 mg Oral Daily  . heparin subcutaneous  5,000 Units Subcutaneous Q8H  . levothyroxine  50 mcg Oral QAC breakfast  . loratadine  10 mg Oral Daily  . magnesium oxide  400 mg Oral BID  . pantoprazole  40 mg Oral Q1200  . pregabalin  75 mg Oral BID  . sertraline  50 mg Oral Daily  . Warfarin - Pharmacist Dosing Inpatient   Does not apply q1800     Assessment/Plan: s/p Procedure(s): LAPAROSCOPY DIAGNOSTIC OPEN PARTIAL GASTRECTOMY  POD #8.  Open partial gastrectomy for gastric neoplasm T2N0.   soft solids. Nutrition consult.  Will do calorie counts.   No leak seen.  Positional drainage of stomach.  Distal esophagitis.  Will add carafate.    VTE prophylaxis.  On subcutaneous heparin  Chronic atrial fibrillation on chronic Coumadin.  Hgb/HCT stable.  Restarted coumadin yesterday.      LOS: 8 days    Alexiya Franqui 01/23/2016  . .prob

## 2016-01-23 NOTE — Progress Notes (Signed)
ANTICOAGULATION CONSULT NOTE - Initial Consult  Pharmacy Consult for coumadin Indication: atrial fibrillation  Allergies  Allergen Reactions  . Amiodarone      Thyroid and liver and kidney problems  . Codeine Nausea And Vomiting  . Gabapentin Itching  . Hydrocodone Other (See Comments)    hallucination  . Morphine And Related Nausea And Vomiting  . Requip [Ropinirole Hcl]     Headache   . Zinc     nausea    Patient Measurements: Height: 5\' 1"  (154.9 cm) Weight: 162 lb 12.8 oz (73.846 kg) IBW/kg (Calculated) : 47.8  Vital Signs: Temp: 98 F (36.7 C) (05/19 0601) Temp Source: Oral (05/19 0601) BP: 132/68 mmHg (05/19 0934) Pulse Rate: 77 (05/19 0934)  Labs:  Recent Labs  01/21/16 0625 01/22/16 0506 01/23/16 0920  HGB 10.1* 10.5*  --   HCT 31.2* 33.2*  --   PLT 188 204  --   LABPROT 15.7* 18.2* 22.8*  INR 1.23 1.50* 2.03*  CREATININE 0.95 1.01*  --     Estimated Creatinine Clearance: 41.5 mL/min (by C-G formula based on Cr of 1.01).  Assessment: 75 YOF admitted with upper GIB with gastric cancer, s/p open partial gastrectomy. Resumed coumadin yesterday INR 2.03. She is on sq heparin for VTE prophylaxis. CBC stable  She was previously on 2mg  daily except for 3 mg on MWF per coumadin clinic note  Goal of Therapy:  INR 2-3 Monitor platelets by anticoagulation protocol: Yes   Plan:  - Coumadin 2mg  po x 1 - if goes home, ok to continue previous home dose. - Daily PT/INR - Monitor CBC closely - d/c sq heparin  Maryanna Shape, PharmD, BCPS  Clinical Pharmacist  Pager: [phone]   01/23/2016,1:36 PM

## 2016-01-24 LAB — PROTIME-INR
INR: 2.15 — ABNORMAL HIGH (ref 0.00–1.49)
PROTHROMBIN TIME: 23.8 s — AB (ref 11.6–15.2)

## 2016-01-24 MED ORDER — WARFARIN SODIUM 3 MG PO TABS
3.0000 mg | ORAL_TABLET | Freq: Once | ORAL | Status: AC
  Administered 2016-01-24: 3 mg via ORAL
  Filled 2016-01-24: qty 1

## 2016-01-24 NOTE — Progress Notes (Signed)
[name] for coumadin Indication: atrial fibrillation  Allergies  Allergen Reactions  . Amiodarone      Thyroid and liver and kidney problems  . Codeine Nausea And Vomiting  . Gabapentin Itching  . Hydrocodone Other (See Comments)    hallucination  . Morphine And Related Nausea And Vomiting  . Requip [Ropinirole Hcl]     Headache   . Zinc     nausea    Patient Measurements: Height: 5\' 1"  (154.9 cm) Weight: 162 lb 12.8 oz (73.846 kg) IBW/kg (Calculated) : 47.8  Vital Signs: Temp: 97.7 F (36.5 C) (05/20 0514) BP: 156/68 mmHg (05/20 0909) Pulse Rate: 83 (05/20 0909)  Labs:  Recent Labs  01/22/16 0506 01/23/16 0920 01/24/16 0657  HGB 10.5*  --   --   HCT 33.2*  --   --   PLT 204  --   --   LABPROT 18.2* 22.8* 23.8*  INR 1.50* 2.03* 2.15*  CREATININE 1.01*  --   --     Estimated Creatinine Clearance: 41.5 mL/min (by C-G formula based on Cr of 1.01).  Assessment: [name] admitted with upper GIB with gastric cancer, s/p open partial gastrectomy. Resumed coumadin on 5/16.  INR 2.15 today with stable CBC and no issues reported.   She was previously on 2mg  daily except for 3 mg on MWF per coumadin clinic note  Goal of Therapy:  INR 2-3 Monitor platelets by anticoagulation protocol: Yes   Plan:  - Coumadin 3 mg po x 1 this evening - If home, would continue previous home dose - Daily PT/INR - Monitor CBC closely   [name], PharmD, BCPS 01/24/2016, 11:38 AM Pager: [phone]

## 2016-01-24 NOTE — Progress Notes (Signed)
9 Days Post-Op  Subjective: No complaints. Starting to pass some flatus. Had 1 episode of vomiting last night  Objective: Vital signs in last 24 hours: Temp:  [97.7 F (36.5 C)-98 F (36.7 C)] 97.7 F (36.5 C) (05/20 0514) Pulse Rate:  [77-95] 82 (05/20 0514) Resp:  [16] 16 (05/20 0514) BP: (132-149)/(62-74) 147/74 mmHg (05/20 0514) SpO2:  [98 %] 98 % (05/20 0514) Last BM Date: 01/23/16  Intake/Output from previous day:   Intake/Output this shift:    Resp: clear to auscultation bilaterally Cardio: regular rate and rhythm GI: soft, minimal tenderness. few bs  Lab Results:   Recent Labs  01/22/16 0506  WBC 5.9  HGB 10.5*  HCT 33.2*  PLT 204   BMET  Recent Labs  01/22/16 0506  NA 142  K 4.4  CL 108  CO2 24  GLUCOSE 94  BUN 9  CREATININE 1.01*  CALCIUM 8.8*   PT/INR  Recent Labs  01/23/16 0920 01/24/16 0657  LABPROT 22.8* 23.8*  INR 2.03* 2.15*   ABG No results for input(s): PHART, HCO3 in the last 72 hours.  Invalid input(s): PCO2, PO2  Studies/Results: Dg Ugi W/water Sol Cm  01/23/2016  CLINICAL DATA:  Status post partial gastrectomy.  Evaluate for leak. EXAM: WATER SOLUBLE UPPER GI SERIES TECHNIQUE: Single-column upper GI series was performed using water soluble contrast. CONTRAST:  1 ISOVUE-300 IOPAMIDOL (ISOVUE-300) INJECTION 61% COMPARISON:  01/18/2016 FLUOROSCOPY TIME:  Radiation Exposure Index (as provided by the fluoroscopic device): Not available If the device does not provide the exposure index: Fluoroscopy Time (in minutes and seconds):  1 minutes 18 seconds Number of Acquired Images:  1, the rest saved fluoroscopy FINDINGS: Scout images over the chest epigastrium were obtained. Patient had to take swallows LPO. In this position there was no emptying of the remnant stomach with frequent large volume gastroesophageal reflux. Edema and obstruction at the gastro enterostomy with expected, but when supine and RPO the stomach drained readily.  Negative for leak. The duodenal C-loop opacified and is unremarkable. IMPRESSION: 1. Negative for postoperative leak or obstruction. 2. Positional drainage of the remnant stomach. When LPO there was no drainage and large volume, symptomatic gastroesophageal reflux. 3. Distal esophagitis related to #2 or recent NG tube. Electronically Signed   By: Monte Fantasia M.D.   On: 01/23/2016 09:49    Anti-infectives: Anti-infectives    Start     Dose/Rate Route Frequency Ordered Stop   01/15/16 2200  ceFAZolin (ANCEF) IVPB 2g/100 mL premix     2 g 200 mL/hr over 30 Minutes Intravenous Every 8 hours 01/15/16 1932 01/15/16 2239   01/15/16 1021  ceFAZolin (ANCEF) 2-4 GM/100ML-% IVPB    Comments:  Nyoka Cowden   : cabinet override      01/15/16 1021 01/15/16 2229   01/15/16 1010  ceFAZolin (ANCEF) IVPB 2g/100 mL premix     2 g 200 mL/hr over 30 Minutes Intravenous On call to O.R. 01/15/16 1010 01/15/16 1335      Assessment/Plan: s/p Procedure(s): LAPAROSCOPY DIAGNOSTIC (N/A) OPEN PARTIAL GASTRECTOMY (N/A) Advance diet. Continue soft diet carafate for reflux Ambulate Hopefully home soon  LOS: 9 days    TOTH III,PAUL S 01/24/2016

## 2016-01-24 NOTE — Care Management Important Message (Signed)
Important Message  Patient Details  Name: [name] MRN: [id number] Date of Birth: 04-Jun-1934   Medicare Important Message Given:  Yes    Kolsen Choe Abena 01/24/2016, 11:13 AM

## 2016-01-25 LAB — PROTIME-INR
INR: 2.31 — ABNORMAL HIGH (ref 0.00–1.49)
Prothrombin Time: 25.1 seconds — ABNORMAL HIGH (ref 11.6–15.2)

## 2016-01-25 MED ORDER — ENSURE ENLIVE PO LIQD
237.0000 mL | Freq: Two times a day (BID) | ORAL | Status: DC
  Administered 2016-01-25: 237 mL via ORAL

## 2016-01-25 MED ORDER — WARFARIN SODIUM 2 MG PO TABS
2.0000 mg | ORAL_TABLET | ORAL | Status: DC
  Administered 2016-01-25: 2 mg via ORAL
  Filled 2016-01-25 (×2): qty 1

## 2016-01-25 MED ORDER — WARFARIN SODIUM 3 MG PO TABS
3.0000 mg | ORAL_TABLET | ORAL | Status: DC
  Filled 2016-01-25: qty 1

## 2016-01-25 MED ORDER — KCL IN DEXTROSE-NACL 20-5-0.45 MEQ/L-%-% IV SOLN
INTRAVENOUS | Status: DC
  Administered 2016-01-25: 15:00:00 via INTRAVENOUS
  Filled 2016-01-25 (×3): qty 1000

## 2016-01-25 MED ORDER — WARFARIN SODIUM 2 MG PO TABS: 2.0000 mg | ORAL_TABLET | ORAL | Status: DC

## 2016-01-25 MED ORDER — WARFARIN SODIUM 3 MG PO TABS: 3.0000 mg | ORAL_TABLET | ORAL | Status: DC

## 2016-01-25 NOTE — Progress Notes (Signed)
[place] for coumadin Indication: atrial fibrillation  Allergies  Allergen Reactions  . Amiodarone      Thyroid and liver and kidney problems  . Codeine Nausea And Vomiting  . Gabapentin Itching  . Hydrocodone Other (See Comments)    hallucination  . Morphine And Related Nausea And Vomiting  . Requip [Ropinirole Hcl]     Headache   . Zinc     nausea    Patient Measurements: Height: 5\' 1"  (154.9 cm) Weight: 162 lb 12.8 oz (73.846 kg) IBW/kg (Calculated) : 47.8  Vital Signs: Temp: 97.9 F (36.6 C) (05/21 0554) Temp Source: Oral (05/21 0554) BP: 140/74 mmHg (05/21 0938) Pulse Rate: 76 (05/21 0938)  Labs:  Recent Labs  01/23/16 0920 01/24/16 0657 01/25/16 0558  LABPROT 22.8* 23.8* 25.1*  INR 2.03* 2.15* 2.31*    Estimated Creatinine Clearance: 41.5 mL/min (by C-G formula based on Cr of 1.01).  Assessment: 27 YOF admitted with upper GIB with gastric cancer, s/p open partial gastrectomy. Resumed coumadin on 5/16. She was previously on 2mg  daily except for 3 mg on MWF per coumadin clinic note   INR remains therapeutic today at 2.31. No sxs or bleeding. Last CBC on 5/18.   Goal of Therapy:  INR 2-3 Monitor platelets by anticoagulation protocol: Yes   Plan:  1. Resume home dose of coumadin at 2mg  daily except for 3 mg on MWF 2. Daily PT/INR 3. CBC for 5/22    Vincenza Hews, PharmD, BCPS 01/25/2016, 10:32 AM Pager: [phone]

## 2016-01-25 NOTE — Progress Notes (Signed)
10 Days Post-Op  Subjective: Complains only of a coughing spell earlier. Not taking much po  Objective: Vital signs in last 24 hours: Temp:  [97.9 F (36.6 C)-98.6 F (37 C)] 97.9 F (36.6 C) (05/21 0554) Pulse Rate:  [76-84] 76 (05/21 0938) Resp:  [18-22] 18 (05/21 0554) BP: (125-157)/(62-76) 140/74 mmHg (05/21 0938) SpO2:  [98 %] 98 % (05/21 0554) Last BM Date: 01/25/16  Intake/Output from previous day:   Intake/Output this shift:    Resp: clear to auscultation bilaterally Cardio: regular rate and rhythm GI: soft, nontender  Lab Results:  No results for input(s): WBC, HGB, HCT, PLT in the last 72 hours. BMET No results for input(s): NA, K, CL, CO2, GLUCOSE, BUN, CREATININE, CALCIUM in the last 72 hours. PT/INR  Recent Labs  01/24/16 0657 01/25/16 0558  LABPROT 23.8* 25.1*  INR 2.15* 2.31*   ABG No results for input(s): PHART, HCO3 in the last 72 hours.  Invalid input(s): PCO2, PO2  Studies/Results: No results found.  Anti-infectives: Anti-infectives    Start     Dose/Rate Route Frequency Ordered Stop   01/15/16 2200  ceFAZolin (ANCEF) IVPB 2g/100 mL premix     2 g 200 mL/hr over 30 Minutes Intravenous Every 8 hours 01/15/16 1932 01/15/16 2239   01/15/16 1021  ceFAZolin (ANCEF) 2-4 GM/100ML-% IVPB    Comments:  Nyoka Cowden   : cabinet override      01/15/16 1021 01/15/16 2229   01/15/16 1010  ceFAZolin (ANCEF) IVPB 2g/100 mL premix     2 g 200 mL/hr over 30 Minutes Intravenous On call to O.R. 01/15/16 1010 01/15/16 1335      Assessment/Plan: s/p Procedure(s): LAPAROSCOPY DIAGNOSTIC (N/A) OPEN PARTIAL GASTRECTOMY (N/A) Advance diet. Calorie count. Add ensure ambulate  LOS: 10 days    TOTH III,Saprina Chuong S 01/25/2016

## 2016-01-26 ENCOUNTER — Other Ambulatory Visit: Payer: Self-pay | Admitting: Internal Medicine

## 2016-01-26 LAB — BASIC METABOLIC PANEL
ANION GAP: 8 (ref 5–15)
BUN: 8 mg/dL (ref 6–20)
CALCIUM: 8.7 mg/dL — AB (ref 8.9–10.3)
CHLORIDE: 108 mmol/L (ref 101–111)
CO2: 26 mmol/L (ref 22–32)
CREATININE: 1.01 mg/dL — AB (ref 0.44–1.00)
GFR calc non Af Amer: 52 mL/min — ABNORMAL LOW (ref >60)
GFR, EST AFRICAN AMERICAN: 60 mL/min — AB (ref >60)
Glucose, Bld: 91 mg/dL (ref 65–99)
Potassium: 3.5 mmol/L (ref 3.5–5.1)
SODIUM: 142 mmol/L (ref 135–145)

## 2016-01-26 LAB — CBC WITH DIFFERENTIAL/PLATELET
BASOS ABS: 0 10*3/uL (ref 0.0–0.1)
BASOS PCT: 0 %
EOS ABS: 0.3 10*3/uL (ref 0.0–0.7)
Eosinophils Relative: 5 %
HEMATOCRIT: 35.7 % — AB (ref 36.0–46.0)
HEMOGLOBIN: 11 g/dL — AB (ref 12.0–15.0)
Lymphocytes Relative: 32 %
Lymphs Abs: 2.1 10*3/uL (ref 0.7–4.0)
MCH: 28.8 pg (ref 26.0–34.0)
MCHC: 30.8 g/dL (ref 30.0–36.0)
MCV: 93.5 fL (ref 78.0–100.0)
Monocytes Absolute: 0.7 10*3/uL (ref 0.1–1.0)
Monocytes Relative: 11 %
NEUTROS ABS: 3.4 10*3/uL (ref 1.7–7.7)
NEUTROS PCT: 52 %
Platelets: 246 10*3/uL (ref 150–400)
RBC: 3.82 MIL/uL — ABNORMAL LOW (ref 3.87–5.11)
RDW: 19 % — ABNORMAL HIGH (ref 11.5–15.5)
WBC: 6.5 10*3/uL (ref 4.0–10.5)

## 2016-01-26 LAB — PROTIME-INR
INR: 2.28 — AB (ref 0.00–1.49)
PROTHROMBIN TIME: 24.9 s — AB (ref 11.6–15.2)

## 2016-01-26 MED ORDER — WARFARIN SODIUM 2.5 MG PO TABS
2.5000 mg | ORAL_TABLET | Freq: Every day | ORAL | Status: DC
Start: 2016-01-26 — End: 2016-01-26

## 2016-01-26 MED ORDER — METHOCARBAMOL 500 MG PO TABS: 500.0000 mg | ORAL_TABLET | Freq: Three times a day (TID) | ORAL | Status: DC | PRN

## 2016-01-26 MED ORDER — ONDANSETRON 4 MG PO TBDP: 4.0000 mg | ORAL_TABLET | Freq: Four times a day (QID) | ORAL | Status: DC | PRN

## 2016-01-26 MED ORDER — DIPHENOXYLATE-ATROPINE 2.5-0.025 MG PO TABS: 1.0000 | ORAL_TABLET | Freq: Four times a day (QID) | ORAL | Status: DC

## 2016-01-26 MED ORDER — SUCRALFATE 1 GM/10ML PO SUSP: 1.0000 g | Freq: Three times a day (TID) | ORAL | Status: DC

## 2016-01-26 MED ORDER — TRAMADOL HCL 50 MG PO TABS: 50.0000 mg | ORAL_TABLET | Freq: Four times a day (QID) | ORAL | Status: DC | PRN

## 2016-01-26 MED ORDER — ACETAMINOPHEN 325 MG PO TABS: 650.0000 mg | ORAL_TABLET | ORAL | Status: DC | PRN

## 2016-01-26 NOTE — Progress Notes (Signed)
Information on Magic Cup ice cream nutritional supplement and how to purchase it was provided to patient and family. Diet education provided by nutrition team on 5/17. Answered pt and family's additional questions on nutrition, diet, and how to manage symptoms. Pt's appetite has improved.   Scarlette Ar RD, LDN Inpatient Clinical Dietitian Pager: [phone] After Hours Pager: [phone]

## 2016-01-26 NOTE — Discharge Summary (Signed)
Physician Discharge Summary  Patient ID: [name] MRN: [id number] DOB/AGE: 1934-01-13 82 y.o.  Admit date: 01/15/2016 Discharge date: 01/26/2016  Admission Diagnoses: Patient Active Problem List   Diagnosis Date Noted  . Post gastrectomy syndrome   . Erosive esophagitis   . Uncontrollable vomiting   . Urinary tract infectious disease   . Gastroesophageal reflux disease with esophagitis   . Esophagitis   . Pressure ulcer 02/04/2016  . Hypernatremia 02/04/2016  . Hypokalemia 02/04/2016  . Dehydration 02/04/2016  . Intractable nausea and vomiting 02/03/2016  . Gastric cancer ([place]) 01/15/2016  . Persistent atrial fibrillation ([place]) 01/15/2016  . Protein-calorie malnutrition, moderate ([place]) 12/08/2015  . B12 deficiency 11/24/2015  . Iron deficiency anemia due to chronic blood loss 11/24/2015  . Refusal of blood transfusions as patient is Jehovah's Witness   . Adenocarcinoma of stomach ([place]) 11/11/2015  . CKD (chronic kidney disease), stage III 11/10/2015  . Vitamin B12 deficiency 11/09/2015  . Right knee pain 11/08/2015  . Peripheral nerve disease ([place]) 07/18/2015  . Long term current use of anticoagulant therapy 09/25/2014  . Rupture patellar tendon 07/29/2014  . Essential hypertension 06/04/2014  . Cardiac pacemaker (06/2010) 08/26/2012  . S/P right TKA 02/07/2012  . FHx/o Colon Cancer 07/27/2011  . Aortic stenosis, moderate 07/09/2011  . S/P repair of patent ductus arteriosus 04/22/2011  . Atrial fibrillation ([place]) 12/02/2010  . Renal Cell Cancer ( 2003) 11/17/2007  . Hypothyroidism 11/17/2007  . Gastroparesis 11/17/2007   Discharge Diagnoses:  Principal Problem:   Adenocarcinoma of stomach ([facility]) Active Problems:   Hypothyroidism   Aortic stenosis, moderate   Essential hypertension   CKD (chronic kidney disease), stage III   Refusal of blood transfusions as patient is Jehovah's Witness   Iron deficiency anemia due to chronic blood loss   Protein-calorie  malnutrition, moderate ([facility])   Gastric cancer ([facility])   Chronic atrial fibrillation ([place])   Discharged Condition: stable  Hospital Course:  Pt was admitted to stepdown following a proximal gastrectomy.  Medicine was consulted to assist with management of medical issues.  Physical therapy was consulted.  Upper Gi was performed on POD 3 prior to pulling NGT and letting have clears.  She was started on prophylactic heparin on POD 2 and started on coumadin on POD 5.  Her OnQ was refilled once.  She was eventually able to do ok with soft solids and some liquids. Carafate ordered for esophagitis seen on additional swallow.   She was discharged to home in stable condition on full liquids/soft solids.    Consults: medicine  Significant Diagnostic Studies: labs: HCT 35.7 prior to d/c.  Cr 1.01.    Treatments: surgery: see above   Discharge Exam: Blood pressure 154/69, pulse 80, temperature 98.2 F (36.8 C), temperature source Oral, resp. rate 18, height 5' 1" (1.549 m), weight 73.846 kg (162 lb 12.8 oz), SpO2 97 %. General appearance: alert, cooperative and no distress Resp: breathing comfortably Cardio: regular rate and rhythm GI: soft, non distended  Disposition: 06-Home-Health Care Svc  Discharge Instructions    Call MD for:  persistant nausea and vomiting    Complete by:  As directed      Call MD for:  redness, tenderness, or signs of infection (pain, swelling, redness, odor or green/yellow discharge around incision site)    Complete by:  As directed      Call MD for:  severe uncontrolled pain    Complete by:  As directed      Call  MD for:  temperature >100.4    Complete by:  As directed      Discharge diet:    Complete by:  As directed   Soft diet for 2 weeks, then OK to begin liberalizing diet.     Increase activity slowly    Complete by:  As directed      No wound care    Complete by:  As directed             Medication List    STOP taking these medications         sucralfate 1 g tablet  Commonly known as:  CARAFATE  Replaced by:  sucralfate 1 GM/10ML suspension      TAKE these medications        acetaminophen 325 MG tablet  Commonly known as:  TYLENOL  Take 2 tablets (650 mg total) by mouth every 4 (four) hours as needed for mild pain, moderate pain, fever or headache.     ALPRAZolam 0.5 MG tablet  Commonly known as:  XANAX  TAKE 1/2-1 TABLET BY MOUTH 3 TIMES A DAY AS NEEDED FOR ANXIETY     atenolol 100 MG tablet  Commonly known as:  TENORMIN  Take 1 tablet (100 mg total) by mouth 2 (two) times daily.     B-complex with vitamin C tablet  Take 1 tablet by mouth daily.     Biotin 5000 MCG Caps  Take 10,000 mcg by mouth every morning.     cetirizine 10 MG tablet  Commonly known as:  ZYRTEC  Take 10 mg by mouth at bedtime.     Cyanocobalamin 1000 MCG/ML Kit  Inject 1,000 mcg into the skin as directed.     diphenoxylate-atropine 2.5-0.025 MG tablet  Commonly known as:  LOMOTIL  Take 1 tablet by mouth 4 (four) times daily.     furosemide 40 MG tablet  Commonly known as:  LASIX  TAKE 1 TABLET (40 MG TOTAL) BY MOUTH DAILY.     guaiFENesin 600 MG 12 hr tablet  Commonly known as:  MUCINEX  Take 1,200 mg by mouth 2 (two) times daily as needed for cough or to loosen phlegm.     levothyroxine 100 MCG tablet  Commonly known as:  SYNTHROID, LEVOTHROID  TAKE 1 TABLET BY MOUTH EVERY DAY ON EMPTY STOMACH WITH A FULL GLASS OF WATER     losartan 50 MG tablet  Commonly known as:  COZAAR  Take 1 tablet (50 mg total) by mouth daily.     LYRICA 75 MG capsule  Generic drug:  pregabalin  TAKE ONE CAPSULE BY MOUTH TWICE A DAY     methocarbamol 500 MG tablet  Commonly known as:  ROBAXIN  Take 1 tablet (500 mg total) by mouth every 8 (eight) hours as needed for muscle spasms.     nitroGLYCERIN 0.4 MG SL tablet  Commonly known as:  NITROSTAT  Place 1 tablet (0.4 mg total) under the tongue every 5 (five) minutes as needed for chest pain (MAX 3  TABLETS).     ondansetron 4 MG disintegrating tablet  Commonly known as:  ZOFRAN-ODT  Take 1 tablet (4 mg total) by mouth every 6 (six) hours as needed for nausea.     pantoprazole 40 MG tablet  Commonly known as:  PROTONIX  TAKE 1 TABLET (40 MG TOTAL) BY MOUTH 2 (TWO) TIMES DAILY.     potassium chloride SA 20 MEQ tablet  Commonly known as:  K-DUR,KLOR-CON  Take  20 mEq by mouth 2 (two) times daily.     sertraline 50 MG tablet  Commonly known as:  ZOLOFT  Take 1 tablet (50 mg total) by mouth daily. For mood     sucralfate 1 GM/10ML suspension  Commonly known as:  CARAFATE  Take 10 mLs (1 g total) by mouth 4 (four) times daily -  with meals and at bedtime.     traMADol 50 MG tablet  Commonly known as:  ULTRAM  Take 1 tablet (50 mg total) by mouth every 6 (six) hours as needed (pain). Reported on 10/14/2015     triamcinolone cream 0.1 %  Commonly known as:  KENALOG  APPLY TO AFFECTED AREA TWICE A DAY     VITAMIN D PO  Take 5,000 Units by mouth every morning.     warfarin 2.5 MG tablet  Commonly known as:  COUMADIN  Take 1 tablet (2.5 mg total) by mouth daily.           Follow-up Information    Follow up with [facility].   Why:  Home health PT arranged   Contact information:   [address] [place] [address] [phone]       Follow up with Stark Klein, MD On 02/09/2016.   Specialty:  General Surgery   Contact information:   [address] [place] [address] [phone]       Signed: Stark Klein 01/26/2016, 7:57 AM

## 2016-01-26 NOTE — Progress Notes (Signed)
Education given on CHF, partial gastrectomy, soft diet, dumping syndrome and stomach cancer.

## 2016-01-26 NOTE — Progress Notes (Addendum)
Patient ID: [name], female   DOB: 1934/06/11, 82 y.o.   MRN: [id number] 11 Days Post-Op   Subjective: PO intake improving.  Carafate helping burning.    Objective: Vital signs in last 24 hours: Temp:  [98.2 F (36.8 C)-98.8 F (37.1 C)] 98.2 F (36.8 C) (05/22 0525) Pulse Rate:  [76-98] 80 (05/22 0525) Resp:  [18] 18 (05/22 0525) BP: (140-154)/(61-74) 154/69 mmHg (05/22 0525) SpO2:  [97 %-99 %] 97 % (05/22 0525) Last BM Date: 01/25/16  Intake/Output from previous day: 05/21 0701 - 05/22 0700 In: 215.8 [I.V.:215.8] Out: -  Intake/Output this shift:    Resp:breathing comfortably Cardio: regular rate and rhythm GI: soft, nontender  Lab Results:   Recent Labs  01/26/16 0532  WBC 6.5  HGB 11.0*  HCT 35.7*  PLT 246   BMET  Recent Labs  01/26/16 0532  NA 142  K 3.5  CL 108  CO2 26  GLUCOSE 91  BUN 8  CREATININE 1.01*  CALCIUM 8.7*   PT/INR  Recent Labs  01/25/16 0558 01/26/16 0532  LABPROT 25.1* 24.9*  INR 2.31* 2.28*   ABG No results for input(s): PHART, HCO3 in the last 72 hours.  Invalid input(s): PCO2, PO2  Studies/Results: No results found.  Anti-infectives: Anti-infectives    Start     Dose/Rate Route Frequency Ordered Stop   01/15/16 2200  ceFAZolin (ANCEF) IVPB 2g/100 mL premix     2 g 200 mL/hr over 30 Minutes Intravenous Every 8 hours 01/15/16 1932 01/15/16 2239   01/15/16 1021  ceFAZolin (ANCEF) 2-4 GM/100ML-% IVPB    Comments:  Nyoka Cowden   : cabinet override      01/15/16 1021 01/15/16 2229   01/15/16 1010  ceFAZolin (ANCEF) IVPB 2g/100 mL premix     2 g 200 mL/hr over 30 Minutes Intravenous On call to O.R. 01/15/16 1010 01/15/16 1335      Assessment/Plan: s/p Procedure(s): LAPAROSCOPY DIAGNOSTIC (N/A) OPEN PARTIAL GASTRECTOMY (N/A) Soft diet. See if nutrition can add magic cup.  Calorie count. Ensure PT Home today. Coumadin for afib anticoagulation ambulate  LOS: 11 days    Mckennon Zwart 01/26/2016

## 2016-01-26 NOTE — Care Management Important Message (Signed)
Important Message  Patient Details  Name: [name] MRN: [id number] Date of Birth: 01/04/1934   Medicare Important Message Given:  Yes    Sharin Mons, RN 01/26/2016, 1:23 PM

## 2016-01-26 NOTE — Progress Notes (Signed)
Physical Therapy Treatment Patient Details Name: [name] MRN: [id number] DOB: 14-Sep-1933 Today's Date: 01/26/2016    History of Present Illness 82 y.o. female with history of CAD, tachy-brady s/p pacer, Afib, Stage 3 CKD, HTN, moderate AoS, hypothyroidism, and iron deficiency anemia secondary to gastric cancer now s/p open partial gastrectomy.      PT Comments    Patient continues to progress toward mobility goals and cues given for rest breaks. Stair training complete. Daughters present for session and actively participating. Current plan remains appropriate.    Follow Up Recommendations  Home health PT     Equipment Recommendations  None recommended by PT    Recommendations for Other Services       Precautions / Restrictions Precautions Precautions: Fall Restrictions Weight Bearing Restrictions: No    Mobility  Bed Mobility               General bed mobility comments: OOB in chair upon arrival  Transfers Overall transfer level: Needs assistance Equipment used: Rolling walker (2 wheeled) Transfers: Sit to/from Stand Sit to Stand: Supervision         General transfer comment: safe hand placement; supervision for safety  Ambulation/Gait Ambulation/Gait assistance: Supervision Ambulation Distance (Feet): 200 Feet Assistive device: Rolling walker (2 wheeled) Gait Pattern/deviations: Step-through pattern;Decreased stride length;Trunk flexed;Narrow base of support     General Gait Details: cues for posture and breathing technique; one standing rest break required prior to stair training   Stairs Stairs: Yes Stairs assistance: Min guard Stair Management: One rail Right;Forwards;Sideways Number of Stairs: 7 General stair comments: HHA with use of R hand rail to simulate hand rail and use of cane at home; educated pt on sequencing and technique   Wheelchair Mobility    Modified Rankin (Stroke Patients Only)       Balance Overall balance  assessment: Needs assistance Sitting-balance support: No upper extremity supported;Feet supported Sitting balance-Leahy Scale: Good     Standing balance support: Bilateral upper extremity supported Standing balance-Leahy Scale: Fair                      Cognition Arousal/Alertness: Awake/alert Behavior During Therapy: WFL for tasks assessed/performed Overall Cognitive Status: Within Functional Limits for tasks assessed                      Exercises      General Comments General comments (skin integrity, edema, etc.): daughters present for session      Pertinent Vitals/Pain Pain Assessment: No/denies pain    Home Living                      Prior Function            PT Goals (current goals can now be found in the care plan section) Acute Rehab PT Goals Patient Stated Goal: go home Progress towards PT goals: Progressing toward goals    Frequency  Min 3X/week    PT Plan Current plan remains appropriate    Co-evaluation             End of Session Equipment Utilized During Treatment: Gait belt Activity Tolerance: Patient tolerated treatment well Patient left: in chair;with call bell/phone within reach;with chair alarm set;with family/visitor present     Time: [id number] PT Time Calculation (min) (ACUTE ONLY): 17 min  Charges:  $Gait Training: 8-22 mins  G Codes:      Salina April, PTA Pager: [phone]   01/26/2016, 2:34 PM

## 2016-01-26 NOTE — Progress Notes (Signed)
Nsg Discharge Note  Admit Date:  01/15/2016 Discharge date: 01/26/2016   [name] to be D/C'd Home per MD order.  AVS completed.  Copy for chart, and copy for patient signed, and dated. Patient/caregiver able to verbalize understanding.  Discharge Medication:   Medication List    STOP taking these medications        sucralfate 1 g tablet  Commonly known as:  CARAFATE  Replaced by:  sucralfate 1 GM/10ML suspension      TAKE these medications        acetaminophen 325 MG tablet  Commonly known as:  TYLENOL  Take 2 tablets (650 mg total) by mouth every 4 (four) hours as needed for mild pain, moderate pain, fever or headache.     ALPRAZolam 0.5 MG tablet  Commonly known as:  XANAX  TAKE 1/2-1 TABLET BY MOUTH 3 TIMES A DAY AS NEEDED FOR ANXIETY     atenolol 100 MG tablet  Commonly known as:  TENORMIN  Take 1 tablet (100 mg total) by mouth 2 (two) times daily.     B-complex with vitamin C tablet  Take 1 tablet by mouth daily.     Biotin 5000 MCG Caps  Take 10,000 mcg by mouth every morning.     cetirizine 10 MG tablet  Commonly known as:  ZYRTEC  Take 10 mg by mouth at bedtime.     Cyanocobalamin 1000 MCG/ML Kit  Inject 1,000 mcg into the skin as directed.     diphenoxylate-atropine 2.5-0.025 MG tablet  Commonly known as:  LOMOTIL  Take 1 tablet by mouth 4 (four) times daily.     furosemide 40 MG tablet  Commonly known as:  LASIX  TAKE 1 TABLET (40 MG TOTAL) BY MOUTH DAILY.     guaiFENesin 600 MG 12 hr tablet  Commonly known as:  MUCINEX  Take 1,200 mg by mouth 2 (two) times daily as needed for cough or to loosen phlegm.     levothyroxine 100 MCG tablet  Commonly known as:  SYNTHROID, LEVOTHROID  TAKE 1 TABLET BY MOUTH EVERY DAY ON EMPTY STOMACH WITH A FULL GLASS OF WATER     losartan 50 MG tablet  Commonly known as:  COZAAR  Take 1 tablet (50 mg total) by mouth daily.     LYRICA 75 MG capsule  Generic drug:  pregabalin  TAKE ONE CAPSULE BY MOUTH  TWICE A DAY     methocarbamol 500 MG tablet  Commonly known as:  ROBAXIN  Take 1 tablet (500 mg total) by mouth every 8 (eight) hours as needed for muscle spasms.     nitroGLYCERIN 0.4 MG SL tablet  Commonly known as:  NITROSTAT  Place 1 tablet (0.4 mg total) under the tongue every 5 (five) minutes as needed for chest pain (MAX 3 TABLETS).     ondansetron 4 MG disintegrating tablet  Commonly known as:  ZOFRAN-ODT  Take 1 tablet (4 mg total) by mouth every 6 (six) hours as needed for nausea.     pantoprazole 40 MG tablet  Commonly known as:  PROTONIX  TAKE 1 TABLET (40 MG TOTAL) BY MOUTH 2 (TWO) TIMES DAILY.     potassium chloride SA 20 MEQ tablet  Commonly known as:  K-DUR,KLOR-CON  Take 20 mEq by mouth 2 (two) times daily.     sertraline 50 MG tablet  Commonly known as:  ZOLOFT  Take 1 tablet (50 mg total) by mouth daily. For mood  sucralfate 1 GM/10ML suspension  Commonly known as:  CARAFATE  Take 10 mLs (1 g total) by mouth 4 (four) times daily -  with meals and at bedtime.     traMADol 50 MG tablet  Commonly known as:  ULTRAM  Take 1 tablet (50 mg total) by mouth every 6 (six) hours as needed (pain). Reported on 10/14/2015     triamcinolone cream 0.1 %  Commonly known as:  KENALOG  APPLY TO AFFECTED AREA TWICE A DAY     VITAMIN D PO  Take 5,000 Units by mouth every morning.     warfarin 2.5 MG tablet  Commonly known as:  COUMADIN  Take 1 tablet (2.5 mg total) by mouth daily.        Discharge Assessment: Filed Vitals:   01/26/16 0525 01/26/16 0827  BP: 154/69 146/59  Pulse: 80 75  Temp: 98.2 F (36.8 C)   Resp: 18   Patient has an incision on abdomen with steris.  IV catheter discontinued intact. Site without signs and symptoms of complications - no redness or edema noted at insertion site, patient denies c/o pain - only slight tenderness at site.  Dressing with slight pressure applied.  D/c Instructions-Education: Discharge instructions given to  patient/family with verbalized understanding. D/c education completed with patient/family including follow up instructions, medication list, d/c activities limitations if indicated, with other d/c instructions as indicated by MD - patient able to verbalize understanding, all questions fully answered. Patient instructed to return to ED, call 911, or call MD for any changes in condition.  Patient escorted via [place], and D/C home via private auto.  Melvyn Hommes Margaretha Sheffield, RN 01/26/2016 2:04 PM

## 2016-01-26 NOTE — Progress Notes (Signed)
[facility] for coumadin Indication: atrial fibrillation  Allergies  Allergen Reactions  . Amiodarone      Thyroid and liver and kidney problems  . Codeine Nausea And Vomiting  . Gabapentin Itching  . Hydrocodone Other (See Comments)    hallucination  . Morphine And Related Nausea And Vomiting  . Requip [Ropinirole Hcl]     Headache   . Zinc     nausea    Patient Measurements: Height: 5\' 1"  (154.9 cm) Weight: 162 lb 12.8 oz (73.846 kg) IBW/kg (Calculated) : 47.8  Vital Signs: Temp: 98.2 F (36.8 C) (05/22 0525) Temp Source: Oral (05/22 0525) BP: 146/59 mmHg (05/22 0827) Pulse Rate: 75 (05/22 0827)  Labs:  Recent Labs  01/24/16 0657 01/25/16 0558 01/26/16 0532  HGB  --   --  11.0*  HCT  --   --  35.7*  PLT  --   --  246  LABPROT 23.8* 25.1* 24.9*  INR 2.15* 2.31* 2.28*  CREATININE  --   --  1.01*    Estimated Creatinine Clearance: 41.5 mL/min (by C-G formula based on Cr of 1.01).  Assessment: 22 YOF admitted with upper GIB with gastric cancer, s/p open partial gastrectomy. Resumed coumadin on 5/16. She was previously on 2mg  daily except for 3 mg on MWF per coumadin clinic note  INR remains therapeutic today at 2.28. No sxs or bleeding. CBC improved.  Goal of Therapy:  INR 2-3 Monitor platelets by anticoagulation protocol: Yes   Plan:  1. Continue coumadin at 2mg  daily except for 3 mg on MWF 2. Daily PT/INR  [employer], Pharm.D., BCPS Clinical Pharmacist Pager [phone] 01/26/2016 1:23 PM

## 2016-01-28 DIAGNOSIS — R1310 Dysphagia, unspecified: Secondary | ICD-10-CM | POA: Diagnosis not present

## 2016-01-28 DIAGNOSIS — Z7901 Long term (current) use of anticoagulants: Secondary | ICD-10-CM | POA: Diagnosis not present

## 2016-01-28 DIAGNOSIS — C169 Malignant neoplasm of stomach, unspecified: Secondary | ICD-10-CM | POA: Diagnosis not present

## 2016-01-28 DIAGNOSIS — I482 Chronic atrial fibrillation: Secondary | ICD-10-CM | POA: Diagnosis not present

## 2016-01-28 DIAGNOSIS — Z483 Aftercare following surgery for neoplasm: Secondary | ICD-10-CM | POA: Diagnosis not present

## 2016-01-29 DIAGNOSIS — I482 Chronic atrial fibrillation: Secondary | ICD-10-CM | POA: Diagnosis not present

## 2016-01-29 DIAGNOSIS — Z7901 Long term (current) use of anticoagulants: Secondary | ICD-10-CM | POA: Diagnosis not present

## 2016-01-29 DIAGNOSIS — R1310 Dysphagia, unspecified: Secondary | ICD-10-CM | POA: Diagnosis not present

## 2016-01-29 DIAGNOSIS — C169 Malignant neoplasm of stomach, unspecified: Secondary | ICD-10-CM | POA: Diagnosis not present

## 2016-01-29 DIAGNOSIS — Z483 Aftercare following surgery for neoplasm: Secondary | ICD-10-CM | POA: Diagnosis not present

## 2016-01-30 DIAGNOSIS — Z7901 Long term (current) use of anticoagulants: Secondary | ICD-10-CM | POA: Diagnosis not present

## 2016-01-30 DIAGNOSIS — Z483 Aftercare following surgery for neoplasm: Secondary | ICD-10-CM | POA: Diagnosis not present

## 2016-01-30 DIAGNOSIS — C169 Malignant neoplasm of stomach, unspecified: Secondary | ICD-10-CM | POA: Diagnosis not present

## 2016-01-30 DIAGNOSIS — R1310 Dysphagia, unspecified: Secondary | ICD-10-CM | POA: Diagnosis not present

## 2016-01-30 DIAGNOSIS — I482 Chronic atrial fibrillation: Secondary | ICD-10-CM | POA: Diagnosis not present

## 2016-02-01 DIAGNOSIS — C169 Malignant neoplasm of stomach, unspecified: Secondary | ICD-10-CM | POA: Diagnosis not present

## 2016-02-01 DIAGNOSIS — I482 Chronic atrial fibrillation: Secondary | ICD-10-CM | POA: Diagnosis not present

## 2016-02-01 DIAGNOSIS — Z7901 Long term (current) use of anticoagulants: Secondary | ICD-10-CM | POA: Diagnosis not present

## 2016-02-01 DIAGNOSIS — R1310 Dysphagia, unspecified: Secondary | ICD-10-CM | POA: Diagnosis not present

## 2016-02-01 DIAGNOSIS — Z483 Aftercare following surgery for neoplasm: Secondary | ICD-10-CM | POA: Diagnosis not present

## 2016-02-03 ENCOUNTER — Emergency Department (HOSPITAL_COMMUNITY): Payer: Medicare Other

## 2016-02-03 ENCOUNTER — Encounter (HOSPITAL_COMMUNITY): Payer: Self-pay | Admitting: *Deleted

## 2016-02-03 ENCOUNTER — Telehealth: Payer: Self-pay

## 2016-02-03 ENCOUNTER — Inpatient Hospital Stay (HOSPITAL_COMMUNITY)
Admission: EM | Admit: 2016-02-03 | Discharge: 2016-02-13 | DRG: 391 | Disposition: A | Discharge status: Home Health | Payer: Medicare Other | Attending: Internal Medicine | Admitting: Internal Medicine

## 2016-02-03 DIAGNOSIS — K2100 Gastro-esophageal reflux disease with esophagitis, without bleeding: Secondary | ICD-10-CM | POA: Insufficient documentation

## 2016-02-03 DIAGNOSIS — C169 Malignant neoplasm of stomach, unspecified: Secondary | ICD-10-CM | POA: Diagnosis present

## 2016-02-03 DIAGNOSIS — E785 Hyperlipidemia, unspecified: Secondary | ICD-10-CM | POA: Diagnosis present

## 2016-02-03 DIAGNOSIS — I482 Chronic atrial fibrillation: Secondary | ICD-10-CM | POA: Diagnosis present

## 2016-02-03 DIAGNOSIS — Z8542 Personal history of malignant neoplasm of other parts of uterus: Secondary | ICD-10-CM

## 2016-02-03 DIAGNOSIS — I4891 Unspecified atrial fibrillation: Secondary | ICD-10-CM | POA: Diagnosis not present

## 2016-02-03 DIAGNOSIS — K259 Gastric ulcer, unspecified as acute or chronic, without hemorrhage or perforation: Secondary | ICD-10-CM | POA: Diagnosis not present

## 2016-02-03 DIAGNOSIS — Y842 Radiological procedure and radiotherapy as the cause of abnormal reaction of the patient, or of later complication, without mention of misadventure at the time of the procedure: Secondary | ICD-10-CM | POA: Diagnosis present

## 2016-02-03 DIAGNOSIS — E039 Hypothyroidism, unspecified: Secondary | ICD-10-CM | POA: Diagnosis present

## 2016-02-03 DIAGNOSIS — Z950 Presence of cardiac pacemaker: Secondary | ICD-10-CM

## 2016-02-03 DIAGNOSIS — Z96651 Presence of right artificial knee joint: Secondary | ICD-10-CM | POA: Diagnosis present

## 2016-02-03 DIAGNOSIS — E871 Hypo-osmolality and hyponatremia: Secondary | ICD-10-CM | POA: Diagnosis not present

## 2016-02-03 DIAGNOSIS — I10 Essential (primary) hypertension: Secondary | ICD-10-CM | POA: Diagnosis not present

## 2016-02-03 DIAGNOSIS — L89151 Pressure ulcer of sacral region, stage 1: Secondary | ICD-10-CM | POA: Diagnosis present

## 2016-02-03 DIAGNOSIS — I080 Rheumatic disorders of both mitral and aortic valves: Secondary | ICD-10-CM | POA: Diagnosis not present

## 2016-02-03 DIAGNOSIS — R112 Nausea with vomiting, unspecified: Secondary | ICD-10-CM | POA: Diagnosis not present

## 2016-02-03 DIAGNOSIS — K209 Esophagitis, unspecified without bleeding: Secondary | ICD-10-CM

## 2016-02-03 DIAGNOSIS — I739 Peripheral vascular disease, unspecified: Secondary | ICD-10-CM | POA: Diagnosis not present

## 2016-02-03 DIAGNOSIS — K208 Other esophagitis: Secondary | ICD-10-CM | POA: Diagnosis not present

## 2016-02-03 DIAGNOSIS — R52 Pain, unspecified: Secondary | ICD-10-CM | POA: Diagnosis not present

## 2016-02-03 DIAGNOSIS — I481 Persistent atrial fibrillation: Secondary | ICD-10-CM | POA: Diagnosis not present

## 2016-02-03 DIAGNOSIS — E860 Dehydration: Secondary | ICD-10-CM | POA: Diagnosis not present

## 2016-02-03 DIAGNOSIS — E876 Hypokalemia: Secondary | ICD-10-CM | POA: Diagnosis not present

## 2016-02-03 DIAGNOSIS — Z79899 Other long term (current) drug therapy: Secondary | ICD-10-CM

## 2016-02-03 DIAGNOSIS — Z888 Allergy status to other drugs, medicaments and biological substances status: Secondary | ICD-10-CM

## 2016-02-03 DIAGNOSIS — I480 Paroxysmal atrial fibrillation: Secondary | ICD-10-CM | POA: Diagnosis present

## 2016-02-03 DIAGNOSIS — Z905 Acquired absence of kidney: Secondary | ICD-10-CM

## 2016-02-03 DIAGNOSIS — K529 Noninfective gastroenteritis and colitis, unspecified: Secondary | ICD-10-CM | POA: Diagnosis present

## 2016-02-03 DIAGNOSIS — N390 Urinary tract infection, site not specified: Secondary | ICD-10-CM | POA: Diagnosis present

## 2016-02-03 DIAGNOSIS — K2210 Ulcer of esophagus without bleeding: Secondary | ICD-10-CM | POA: Insufficient documentation

## 2016-02-03 DIAGNOSIS — K2970 Gastritis, unspecified, without bleeding: Secondary | ICD-10-CM | POA: Diagnosis not present

## 2016-02-03 DIAGNOSIS — R1110 Vomiting, unspecified: Secondary | ICD-10-CM

## 2016-02-03 DIAGNOSIS — Z800 Family history of malignant neoplasm of digestive organs: Secondary | ICD-10-CM

## 2016-02-03 DIAGNOSIS — I69398 Other sequelae of cerebral infarction: Secondary | ICD-10-CM

## 2016-02-03 DIAGNOSIS — G43A1 Cyclical vomiting, intractable: Secondary | ICD-10-CM | POA: Diagnosis not present

## 2016-02-03 DIAGNOSIS — Z96641 Presence of right artificial hip joint: Secondary | ICD-10-CM | POA: Diagnosis present

## 2016-02-03 DIAGNOSIS — J181 Lobar pneumonia, unspecified organism: Secondary | ICD-10-CM | POA: Diagnosis not present

## 2016-02-03 DIAGNOSIS — Z483 Aftercare following surgery for neoplasm: Secondary | ICD-10-CM | POA: Diagnosis not present

## 2016-02-03 DIAGNOSIS — E870 Hyperosmolality and hypernatremia: Secondary | ICD-10-CM | POA: Diagnosis present

## 2016-02-03 DIAGNOSIS — F419 Anxiety disorder, unspecified: Secondary | ICD-10-CM | POA: Diagnosis present

## 2016-02-03 DIAGNOSIS — B370 Candidal stomatitis: Secondary | ICD-10-CM | POA: Diagnosis not present

## 2016-02-03 DIAGNOSIS — Z7901 Long term (current) use of anticoagulants: Secondary | ICD-10-CM

## 2016-02-03 DIAGNOSIS — N183 Chronic kidney disease, stage 3 (moderate): Secondary | ICD-10-CM | POA: Diagnosis not present

## 2016-02-03 DIAGNOSIS — Z885 Allergy status to narcotic agent status: Secondary | ICD-10-CM

## 2016-02-03 DIAGNOSIS — Z8249 Family history of ischemic heart disease and other diseases of the circulatory system: Secondary | ICD-10-CM

## 2016-02-03 DIAGNOSIS — R1310 Dysphagia, unspecified: Secondary | ICD-10-CM | POA: Diagnosis present

## 2016-02-03 DIAGNOSIS — R6881 Early satiety: Secondary | ICD-10-CM | POA: Diagnosis present

## 2016-02-03 DIAGNOSIS — R509 Fever, unspecified: Secondary | ICD-10-CM

## 2016-02-03 DIAGNOSIS — G2581 Restless legs syndrome: Secondary | ICD-10-CM | POA: Diagnosis present

## 2016-02-03 DIAGNOSIS — D649 Anemia, unspecified: Secondary | ICD-10-CM | POA: Diagnosis present

## 2016-02-03 DIAGNOSIS — I130 Hypertensive heart and chronic kidney disease with heart failure and stage 1 through stage 4 chronic kidney disease, or unspecified chronic kidney disease: Secondary | ICD-10-CM | POA: Diagnosis not present

## 2016-02-03 DIAGNOSIS — H5442 Blindness, left eye, normal vision right eye: Secondary | ICD-10-CM | POA: Diagnosis present

## 2016-02-03 DIAGNOSIS — L8990 Pressure ulcer of unspecified site, unspecified stage: Secondary | ICD-10-CM | POA: Diagnosis present

## 2016-02-03 DIAGNOSIS — Z951 Presence of aortocoronary bypass graft: Secondary | ICD-10-CM

## 2016-02-03 DIAGNOSIS — Z85528 Personal history of other malignant neoplasm of kidney: Secondary | ICD-10-CM | POA: Diagnosis not present

## 2016-02-03 DIAGNOSIS — E43 Unspecified severe protein-calorie malnutrition: Secondary | ICD-10-CM | POA: Diagnosis not present

## 2016-02-03 DIAGNOSIS — Z903 Acquired absence of stomach [part of]: Secondary | ICD-10-CM

## 2016-02-03 DIAGNOSIS — K289 Gastrojejunal ulcer, unspecified as acute or chronic, without hemorrhage or perforation: Secondary | ICD-10-CM | POA: Diagnosis not present

## 2016-02-03 DIAGNOSIS — K911 Postgastric surgery syndromes: Secondary | ICD-10-CM | POA: Diagnosis present

## 2016-02-03 DIAGNOSIS — E538 Deficiency of other specified B group vitamins: Secondary | ICD-10-CM | POA: Diagnosis not present

## 2016-02-03 DIAGNOSIS — K910 Vomiting following gastrointestinal surgery: Secondary | ICD-10-CM | POA: Diagnosis not present

## 2016-02-03 DIAGNOSIS — I2510 Atherosclerotic heart disease of native coronary artery without angina pectoris: Secondary | ICD-10-CM | POA: Diagnosis present

## 2016-02-03 LAB — COMPREHENSIVE METABOLIC PANEL
ALT: 12 U/L — AB (ref 14–54)
AST: 19 U/L (ref 15–41)
Albumin: 3.5 g/dL (ref 3.5–5.0)
Alkaline Phosphatase: 90 U/L (ref 38–126)
Anion gap: 11 (ref 5–15)
BUN: 18 mg/dL (ref 6–20)
CHLORIDE: 115 mmol/L — AB (ref 101–111)
CO2: 21 mmol/L — AB (ref 22–32)
CREATININE: 1.06 mg/dL — AB (ref 0.44–1.00)
Calcium: 9.5 mg/dL (ref 8.9–10.3)
GFR calc Af Amer: 56 mL/min — ABNORMAL LOW (ref >60)
GFR, EST NON AFRICAN AMERICAN: 49 mL/min — AB (ref >60)
Glucose, Bld: 126 mg/dL — ABNORMAL HIGH (ref 65–99)
POTASSIUM: 3.4 mmol/L — AB (ref 3.5–5.1)
SODIUM: 147 mmol/L — AB (ref 135–145)
Total Bilirubin: 0.7 mg/dL (ref 0.3–1.2)
Total Protein: 6.8 g/dL (ref 6.5–8.1)

## 2016-02-03 LAB — I-STAT TROPONIN, ED: Troponin i, poc: 0.01 ng/mL (ref 0.00–0.08)

## 2016-02-03 LAB — PROTIME-INR
INR: 2.84 — ABNORMAL HIGH (ref 0.00–1.49)
Prothrombin Time: 29.3 seconds — ABNORMAL HIGH (ref 11.6–15.2)

## 2016-02-03 LAB — CBC
HEMATOCRIT: 42.6 % (ref 36.0–46.0)
Hemoglobin: 13.4 g/dL (ref 12.0–15.0)
MCH: 29.1 pg (ref 26.0–34.0)
MCHC: 31.5 g/dL (ref 30.0–36.0)
MCV: 92.4 fL (ref 78.0–100.0)
Platelets: 261 10*3/uL (ref 150–400)
RBC: 4.61 MIL/uL (ref 3.87–5.11)
RDW: 17.8 % — AB (ref 11.5–15.5)
WBC: 8.3 10*3/uL (ref 4.0–10.5)

## 2016-02-03 LAB — MAGNESIUM: Magnesium: 1.7 mg/dL (ref 1.7–2.4)

## 2016-02-03 LAB — PHOSPHORUS: PHOSPHORUS: 3.8 mg/dL (ref 2.5–4.6)

## 2016-02-03 LAB — LIPASE, BLOOD: LIPASE: 43 U/L (ref 11–51)

## 2016-02-03 MED ORDER — METOCLOPRAMIDE HCL 5 MG/ML IJ SOLN
10.0000 mg | Freq: Four times a day (QID) | INTRAMUSCULAR | Status: DC
  Administered 2016-02-03 – 2016-02-05 (×7): 10 mg via INTRAVENOUS
  Filled 2016-02-03 (×7): qty 2

## 2016-02-03 MED ORDER — LORAZEPAM 2 MG/ML IJ SOLN
1.0000 mg | Freq: Four times a day (QID) | INTRAMUSCULAR | Status: AC | PRN
  Administered 2016-02-03 – 2016-02-06 (×4): 1 mg via INTRAVENOUS
  Filled 2016-02-03 (×4): qty 1

## 2016-02-03 MED ORDER — METOPROLOL TARTRATE 5 MG/5ML IV SOLN
5.0000 mg | Freq: Four times a day (QID) | INTRAVENOUS | Status: DC
  Filled 2016-02-03: qty 5

## 2016-02-03 MED ORDER — ONDANSETRON HCL 4 MG/2ML IJ SOLN
4.0000 mg | Freq: Once | INTRAMUSCULAR | Status: AC
  Administered 2016-02-03: 4 mg via INTRAVENOUS
  Filled 2016-02-03: qty 2

## 2016-02-03 MED ORDER — DIATRIZOATE MEGLUMINE & SODIUM 66-10 % PO SOLN
ORAL | Status: AC
  Filled 2016-02-03: qty 60

## 2016-02-03 MED ORDER — FAMOTIDINE IN NACL 20-0.9 MG/50ML-% IV SOLN
20.0000 mg | INTRAVENOUS | Status: DC
  Administered 2016-02-03 – 2016-02-05 (×3): 20 mg via INTRAVENOUS
  Filled 2016-02-03 (×4): qty 50

## 2016-02-03 MED ORDER — PANTOPRAZOLE SODIUM 40 MG IV SOLR
40.0000 mg | Freq: Two times a day (BID) | INTRAVENOUS | Status: DC
  Administered 2016-02-04 – 2016-02-06 (×6): 40 mg via INTRAVENOUS
  Filled 2016-02-03 (×7): qty 40

## 2016-02-03 MED ORDER — POTASSIUM CHLORIDE IN NACL 20-0.45 MEQ/L-% IV SOLN
INTRAVENOUS | Status: AC
  Administered 2016-02-03 – 2016-02-04 (×2): via INTRAVENOUS
  Administered 2016-02-04: 100 mL/h via INTRAVENOUS
  Administered 2016-02-05 – 2016-02-06 (×2): via INTRAVENOUS
  Filled 2016-02-03 (×9): qty 1000

## 2016-02-03 MED ORDER — DIPHENHYDRAMINE HCL 50 MG/ML IJ SOLN
25.0000 mg | Freq: Four times a day (QID) | INTRAMUSCULAR | Status: DC | PRN
  Administered 2016-02-05 – 2016-02-07 (×3): 25 mg via INTRAVENOUS
  Filled 2016-02-03 (×3): qty 1

## 2016-02-03 MED ORDER — SODIUM CHLORIDE 0.9 % IV BOLUS (SEPSIS)
1000.0000 mL | Freq: Once | INTRAVENOUS | Status: AC
  Administered 2016-02-03: 1000 mL via INTRAVENOUS

## 2016-02-03 MED ORDER — SODIUM CHLORIDE 0.9% FLUSH
3.0000 mL | Freq: Two times a day (BID) | INTRAVENOUS | Status: DC
  Administered 2016-02-03 – 2016-02-13 (×6): 3 mL via INTRAVENOUS

## 2016-02-03 MED ORDER — METOPROLOL TARTRATE 5 MG/5ML IV SOLN: 10.0000 mg | Freq: Four times a day (QID) | INTRAVENOUS | Status: DC

## 2016-02-03 MED ORDER — PROCHLORPERAZINE EDISYLATE 5 MG/ML IJ SOLN
10.0000 mg | INTRAMUSCULAR | Status: DC | PRN
  Administered 2016-02-04 – 2016-02-05 (×3): 10 mg via INTRAVENOUS
  Filled 2016-02-03 (×5): qty 2

## 2016-02-03 MED ORDER — ONDANSETRON HCL 4 MG/2ML IJ SOLN
4.0000 mg | Freq: Once | INTRAMUSCULAR | Status: AC | PRN
  Administered 2016-02-03: 4 mg via INTRAVENOUS
  Filled 2016-02-03: qty 2

## 2016-02-03 MED ORDER — PROCHLORPERAZINE EDISYLATE 5 MG/ML IJ SOLN
10.0000 mg | Freq: Once | INTRAMUSCULAR | Status: AC
  Administered 2016-02-03: 10 mg via INTRAVENOUS
  Filled 2016-02-03: qty 2

## 2016-02-03 MED ORDER — METOPROLOL TARTRATE 5 MG/5ML IV SOLN
5.0000 mg | Freq: Four times a day (QID) | INTRAVENOUS | Status: DC
  Administered 2016-02-03 – 2016-02-07 (×18): 5 mg via INTRAVENOUS
  Filled 2016-02-03 (×16): qty 5

## 2016-02-03 MED ORDER — ONDANSETRON HCL 4 MG/2ML IJ SOLN
4.0000 mg | Freq: Four times a day (QID) | INTRAMUSCULAR | Status: AC | PRN
  Administered 2016-02-04: 4 mg via INTRAVENOUS
  Filled 2016-02-03: qty 2

## 2016-02-03 MED ORDER — PANTOPRAZOLE SODIUM 40 MG IV SOLR
40.0000 mg | Freq: Once | INTRAVENOUS | Status: AC
  Administered 2016-02-03: 40 mg via INTRAVENOUS
  Filled 2016-02-03: qty 40

## 2016-02-03 MED ORDER — FENTANYL CITRATE (PF) 100 MCG/2ML IJ SOLN: 25.0000 ug | Freq: Once | INTRAMUSCULAR | Status: DC

## 2016-02-03 NOTE — Progress Notes (Signed)
Pt admitted s/p subtotal gastrectomy for gastric ca.  Pt with several day h/o of n/v.  Pt admitted with dehydration.  At this time would attempt gain control of n/v.   Pt will likely require esophagogram or possible EGD.  Will notify [name] in AM of pt's admission.

## 2016-02-03 NOTE — Telephone Encounter (Signed)
-----   Message from [name] [name], MD sent at 01/26/2016  5:57 PM EDT ----- Yes I will forward this message to the coumadin clinic  [name] MD, [facility]   ----- Message -----    From: Stark Klein, MD    Sent: 01/26/2016   7:57 AM      To: [name] [name], MD  Dr. [name], This lady has had a partial gastrectomy and is back on anticoagulation.  Can she resume her follow up with your coumadin clinic for management? tx Stark Klein, MD Surgery.

## 2016-02-03 NOTE — ED Notes (Signed)
Per EMS- Pt arrives from home c/o nausea and vomiting x 1.day. Pt states that she has vomited every hour since yesterday. Pt has been taking phenergan suppository with no relief. Pt states she had surgery on her stomach on 01/15/16 where she had 3/4 of her stomach removed due to cancer. Pt states that she has a hx of GERD and she believes the surgery has aggravated it. Denies pain.

## 2016-02-03 NOTE — H&P (Signed)
History and Physical    [name] VFI:[id number] DOB: 13-Jun-1934 DOA: 02/03/2016  PCP: [name], MD   Patient coming from: Home.  Chief Complaint: Nausea and vomiting.  HPI: [name] is a 82 y.o. female with medical history significant of paroxysmal atrial fibrillation, CABG, aortic root dilation, moderate aortic stenosis, tachycardia-bradycardia syndrome on10/28/2011 (status post pacemaker placement in 2012), GERD, peripheral neuropathy, restless leg syndrome, hypertension, hyperlipidemia, hypothyroidism, CVA with left eye blindness, recently diagnosed with adenocarcinoma of the stomach and is S/P gastrectomy earlier this month by [name] who comes to the emergency department with complaints of nausea and multiple episodes of emesis since yesterday evening.   Per patient, after her post gastrectomy  NG tube was removed she has had dysphagia. She was evaluated by speech therapy who noticed that she was requiring multiple swallows with each bite. Last night, the patient's was eating dinner  home and felt that food was getting stuck in her esophagus. Shortly after dinner, the patient started having acute nausea and vomiting which has continued through today. The patient reports at least 20 episodesof emesis since last night. She denies fever, chills, but feels fatigued. She  denies melena, hematochezia. She states that she has frequent loose stools, but denies any recent worsening of BM pattern. She denies headache, chest pain, palpitations, diaphoresis, dizziness or pitting edema lower extremities.  ED Course: Patient received antiemetics, PPI and IV fluids. She states she feels better. Workup shows mild hypernatremia, mild hypokalemia and mild hyperglycemia. She is being admitted for symptoms control, rehydration and electrolyte replacement. General surgery and GI evaluation.   Review of Systems: As per HPI otherwise 10 point review of systems negative.    Past  Medical History  Diagnosis Date  . PAF (paroxysmal atrial fibrillation) ([place])     controlled with amiodarone, on coumadin  . CAD (coronary artery disease)   . Aortic root dilatation ([place])   . Moderate aortic stenosis   . Chronic anticoagulation   . Tachycardia-bradycardia syndrome ([place])     s/p PPM by Dr Doreatha Lew ([facility]) 07/03/10  . GERD (gastroesophageal reflux disease)   . Pacemaker     2012  . Neuropathy ([place])     feet/legs  . Restless leg syndrome   . Headache(784.0)     hx migraines - takes Zoloft for migraines  . History of uterine cancer 1980s    treated with hysterectomy, external radiation and radiation seed implants.   . Cancer ([place])     hx Kidney cancer / hx of endometrial cancer  . Anxiety   . Heart murmur   . HTN (hypertension)   . Hyperlipemia   . Hypothyroidism   . Osteomyelitis ([place]) as child  . Family history of anesthesia complication     daughter has ponv  . Syncope 05/28/2014  . Hepatic steatosis   . Elevated LFTs   . Gastroparesis   . Anal fissure   . Radiation proctitis   . Gastric polyp     adenomatous  . Neuromuscular disorder (H[facility])     neuropathy in right leg  . Adenocarcinoma of stomach ([place]) 11/11/2015  . PONV (postoperative nausea and vomiting)     30 yrs ago since then no problem  . CHF (congestive heart failure) ([place])   . Pneumonia     hx  . Chronic kidney disease     only has one kidney rt lft rem 03 ca  . 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 Anemia  hx 3/17 iron infusion  . Stroke [facility]) 15    partial stroke left legally blind    Past Surgical History  Procedure Laterality Date  . Cardiac catheterization  2008  . Nephrectomy Left     renal cell cancer  . Patent ductus arterious repair  age 74  . Ptvp    . Pacemaker insertion  07/04/11    by Dr Doreatha Lew  . Cholecystectomy  1970s  . Upper gastrointestinal endoscopy    . Total knee arthroplasty  02/07/2012    Procedure: TOTAL KNEE ARTHROPLASTY;  Surgeon: Mauri Pole, MD;  Location: [facility]  ORS;  Service: Orthopedics;  Laterality: Right;  . Insert / replace / remove pacemaker    . Total hip revision Right 2009    initial replacment surg  . I&d knee with poly exchange Right 12/17/2013    Procedure: IRRIGATION AND DEBRIDEMEN RIGHT TOTAL  KNEE WITH POLY EXCHANGE;  Surgeon: Mauri Pole, MD;  Location: [facility] ORS;  Service: Orthopedics;  Laterality: Right;  . Patellar tendon repair Right 03/06/2014    Procedure: AVULSION WITH PATELLA TENDON REPAIR;  Surgeon: Mauri Pole, MD;  Location: [facility] ORS;  Service: Orthopedics;  Laterality: Right;  . Total knee revision Right 07/29/2014    Procedure: RIGHT KNEE REVISION OF PREVIOUS REPAIR EXTENSOR MECHANISM;  Surgeon: Mauri Pole, MD;  Location: [facility] ORS;  Service: Orthopedics;  Laterality: Right;  . Colonoscopy N/A 11/11/2015    Procedure: COLONOSCOPY;  Surgeon: [name], MD;  Location: [facility] ENDOSCOPY;  Service: Endoscopy;  Laterality: N/A;  . Esophagogastroduodenoscopy N/A 11/11/2015    Procedure: ESOPHAGOGASTRODUODENOSCOPY (EGD);  Surgeon: [name], MD;  Location: [facility];  Service: Endoscopy;  Laterality: N/A;  . Joint replacement    . Total abdominal hysterectomy  87  . Atril fib    . Laparoscopy N/A 01/15/2016    Procedure: LAPAROSCOPY DIAGNOSTIC;  Surgeon: Stark Klein, MD;  Location: [facility];  Service: General;  Laterality: N/A;  . Gastrectomy N/A 01/15/2016    Procedure: OPEN PARTIAL GASTRECTOMY;  Surgeon: Stark Klein, MD;  Location: [facility];  Service: General;  Laterality: N/A;     reports that she has never smoked. She has never used smokeless tobacco. She reports that she does not drink alcohol or use illicit drugs.  Allergies  Allergen Reactions  . Amiodarone      Thyroid and liver and kidney problems  . Codeine Nausea And Vomiting  . Gabapentin Itching  . Hydrocodone Other (See Comments)    hallucination  . Morphine And Related Nausea And Vomiting  . Requip [Ropinirole Hcl]     Headache   . Zinc     nausea     Family History  Problem Relation Age of Onset  . Colon cancer Mother 85  . Heart disease Mother   . Heart disease Father   . Heart attack Father   . Heart disease Maternal Grandmother     Prior to Admission medications   Medication Sig Start Date End Date Taking? Authorizing Provider  acetaminophen (TYLENOL) 325 MG tablet Take 2 tablets (650 mg total) by mouth every 4 (four) hours as needed for mild pain, moderate pain, fever or headache. 01/26/16   Stark Klein, MD  ALPRAZolam Duanne Moron) 0.5 MG tablet TAKE 1/2-1 TABLET BY MOUTH 3 TIMES A DAY AS NEEDED FOR ANXIETY 01/01/16   Courtney Forcucci, PA-C  atenolol (TENORMIN) 100 MG tablet Take 1 tablet (100 mg total) by mouth 2 (two) times daily. 05/19/15  Darlin Coco, MD  B Complex-C (B-COMPLEX WITH VITAMIN C) tablet Take 1 tablet by mouth daily. 12/11/15   [name], MD  Biotin 5000 MCG CAPS Take 10,000 mcg by mouth every morning.     Historical Provider, MD  cetirizine (ZYRTEC) 10 MG tablet Take 10 mg by mouth at bedtime.    Historical Provider, MD  Cholecalciferol (VITAMIN D PO) Take 5,000 Units by mouth every morning.     Historical Provider, MD  Cyanocobalamin 1000 MCG/ML KIT Inject 1,000 mcg into the skin as directed. Patient taking differently: Inject 1,000 mcg into the skin every 30 (thirty) days.  11/24/15   Brunetta Genera, MD  diphenoxylate-atropine (LOMOTIL) 2.5-0.025 MG tablet Take 1 tablet by mouth 4 (four) times daily. 01/26/16   Stark Klein, MD  furosemide (LASIX) 40 MG tablet TAKE 1 TABLET (40 MG TOTAL) BY MOUTH DAILY. Patient taking differently: TAKE 1/2 TABLET (20 MG TOTAL) BY MOUTH DAILY. 12/11/15   [name], MD  guaiFENesin (MUCINEX) 600 MG 12 hr tablet Take 1,200 mg by mouth 2 (two) times daily as needed for cough or to loosen phlegm.     Historical Provider, MD  levothyroxine (SYNTHROID, LEVOTHROID) 100 MCG tablet TAKE 1 TABLET BY MOUTH EVERY DAY ON EMPTY STOMACH WITH A FULL GLASS OF WATER Patient taking  differently: TAKE 1/2 TABLET (50 MCG TOTAL) BY MOUTH EVERY DAY ON EMPTY STOMACH WITH A FULL GLASS OF WATER 10/25/15   [name], MD  losartan (COZAAR) 50 MG tablet Take 1 tablet (50 mg total) by mouth daily. 11/26/15   Thompson Grayer, MD  LYRICA 75 MG capsule TAKE ONE CAPSULE BY MOUTH TWICE A DAY 11/29/15   [name], MD  methocarbamol (ROBAXIN) 500 MG tablet Take 1 tablet (500 mg total) by mouth every 8 (eight) hours as needed for muscle spasms. 01/26/16   Stark Klein, MD  nitroGLYCERIN (NITROSTAT) 0.4 MG SL tablet Place 1 tablet (0.4 mg total) under the tongue every 5 (five) minutes as needed for chest pain (MAX 3 TABLETS). 05/19/15   Darlin Coco, MD  ondansetron (ZOFRAN-ODT) 4 MG disintegrating tablet Take 1 tablet (4 mg total) by mouth every 6 (six) hours as needed for nausea. 01/26/16   Stark Klein, MD  pantoprazole (PROTONIX) 40 MG tablet TAKE 1 TABLET (40 MG TOTAL) BY MOUTH 2 (TWO) TIMES DAILY. 12/11/15   [name], MD  potassium chloride SA (K-DUR,KLOR-CON) 20 MEQ tablet Take 20 mEq by mouth 2 (two) times daily.    Historical Provider, MD  sertraline (ZOLOFT) 50 MG tablet Take 1 tablet (50 mg total) by mouth daily. For mood 09/23/15   [name], MD  sucralfate (CARAFATE) 1 GM/10ML suspension Take 10 mLs (1 g total) by mouth 4 (four) times daily -  with meals and at bedtime. 01/26/16   Stark Klein, MD  traMADol (ULTRAM) 50 MG tablet Take 1 tablet (50 mg total) by mouth every 6 (six) hours as needed (pain). Reported on 10/14/2015 01/26/16   Stark Klein, MD  triamcinolone cream (KENALOG) 0.1 % APPLY TO AFFECTED AREA TWICE A DAY 04/09/15   [name], MD  warfarin (COUMADIN) 2 MG tablet TAKE 1 TABLET DAILY OR AS DIRECTED 01/26/16   [name], MD    Physical Exam: Filed Vitals:   02/03/16 1730 02/03/16 1845 02/03/16 1915 02/03/16 2000  BP: 172/97 181/106 160/95 130/80  Pulse: 114 112    Temp:      TempSrc:      Resp: 29  Height:      Weight:      SpO2: 97% 97%          Filed Vitals:   02/03/16 1730 02/03/16 1845 02/03/16 1915 02/03/16 2000  BP: 172/97 181/106 160/95 130/80  Pulse: 114 112    Temp:      TempSrc:      Resp: 29     Height:      Weight:      SpO2: 97% 97%    Constitutional: Looks acutely ill, but in no acute distress. Eyes: PERRL, lids and conjunctivae normal ENMT: Mucous membranes are dry. Posterior pharynx clear of any exudate or lesions. Upper dentures and left premolar cavity.  Neck: Normal, supple, no masses, no thyromegaly. Respiratory: Clear to auscultation bilaterally, no wheezing, no crackles. Normal respiratory effort. No accessory muscle use.  Cardiovascular:  Irregularly irregular, no murmurs / rubs / gallops. No extremity edema. 2+ pedal pulses. No carotid bruits.  Abdomen: Positive surgical scars, no distention. Bowel sounds positive. + epigastric tenderness,                    no guarding, rebound or masses palpated. No hepatosplenomegaly.  Musculoskeletal: No clubbing / cyanosis. No joint deformity upper and lower extremities.                              Good ROM, no contractures. Normal muscle tone.  Skin: Multiple small ecchymosis areas on extremities. No induration Neurologic: CN 2-12 grossly intact. Sensation intact, DTR normal. Strength 5/5 in all 4.  Psychiatric: Normal judgment and insight. Alert and oriented x 4. Normal mood.    Labs on Admission: I have personally reviewed following labs and imaging studies  CBC:  Recent Labs Lab 02/03/16 1655  WBC 8.3  HGB 13.4  HCT 42.6  MCV 92.4  PLT 462   Basic Metabolic Panel:  Recent Labs Lab 02/03/16 1655  NA 147*  K 3.4*  CL 115*  CO2 21*  GLUCOSE 126*  BUN 18  CREATININE 1.06*  CALCIUM 9.5   GFR: Estimated Creatinine Clearance: 35.7 mL/min (by C-G formula based on Cr of 1.06). Liver Function Tests:  Recent Labs Lab 02/03/16 1655  AST 19  ALT 12*  ALKPHOS 90  BILITOT 0.7  PROT 6.8  ALBUMIN 3.5    Recent Labs Lab  02/03/16 1655  LIPASE 43   Coagulation Profile:  Recent Labs Lab 02/03/16 1655  INR 2.84*   Urine analysis:    Component Value Date/Time   COLORURINE YELLOW 01/07/2016 1145   APPEARANCEUR CLEAR 01/07/2016 1145   LABSPEC 1.014 01/07/2016 1145   PHURINE 5.5 01/07/2016 1145   GLUCOSEU NEGATIVE 01/07/2016 1145   HGBUR NEGATIVE 01/07/2016 1145   BILIRUBINUR NEGATIVE 01/07/2016 1145   KETONESUR NEGATIVE 01/07/2016 1145   PROTEINUR NEGATIVE 01/07/2016 1145   UROBILINOGEN 0.2 07/22/2014 1120   NITRITE NEGATIVE 01/07/2016 1145   LEUKOCYTESUR NEGATIVE 01/07/2016 1145   Radiological Exams on Admission: Dg Abd Acute W/chest  02/03/2016  CLINICAL DATA:  Vomiting after stomach surgery 01/15/2016. Partial gastrectomy for cancer. EXAM: DG ABDOMEN ACUTE W/ 1V CHEST COMPARISON:  11/12/2015 abdominal CT FINDINGS: Sutures in the left upper quadrant correlating with partial gastrectomy. Study is limited by gasless abdomen. No dilated loops are seen. No evidence of pneumoperitoneum. Trace left effusion. No pneumonia or edema. No cardiomegaly. Dual-chamber pacer from the right with leads in unremarkable position. IMPRESSION: 1. Gasless abdomen, a nonspecific  finding that limits sensitivity. No indication of bowel obstruction or perforation. 2. Trace left effusion. Electronically Signed   By: Monte Fantasia M.D.   On: 02/03/2016 18:21    EKG: Independently reviewed. Vent. rate 125 BPM PR interval * ms QRS duration 87 ms QT/QTc 338/487 ms P-R-T axes -1 41 -63 Atrial fibrillation Nonspecific repol abnormality, diffuse leads  Assessment/Plan Principal Problem:   Intractable nausea and vomiting   Adenocarcinoma of stomach (HCC)   S/P partial gastrectomy Admit to telemetry/ulceration. Keep nothing by mouth. Continue gentle IV hydration. Continue pantoprazole 40 mg IVP every 12 hours. Start Pepcid 20 mg IVP every 12 hours. Continue antiemetics as needed. Barium swallow rescheduled for tomorrow  if the patient feels better. GI and surgery to evaluate.  Active Problems:   Dehydration   Hypernatremia   Hypokalemia Free water deficit is 1.4 L  Continue gentle IV hydration and electrolyte replacement.   follow-up renal function and electrolytes.    Hypothyroidism Resume levothyroxine 125 g by mouth daily once nausea is controlled. Monitor TSH periodically.    Chronic atrial fibrillation (HCC) CHA2DS2-VASc of 9. Switch to metoprolol IVP while the patient is nothing by mouth for rate control. Resume warfarin once the patient is tolerating oral intake.    Essential hypertension Continue atenolol 100 mg by mouth twice a day. Continue losartan 50 mg by mouth daily. Monitor blood pressure periodically.    Vitamin B12 deficiency Continue monthly B12 injections.    Pressure ulcer Continue surveillance/local care.       DVT prophylaxis: On warfarin for chronic atrial fibrillation. Code Status: Full code. Family Communication:  Disposition Plan: Admit for IV hydration, antiemetics, PPI and further evaluation. Consults called: Gastroenterology was called by the emergency department. Admission status: Observation/telemetry.   [name] MD [facility] Pager [phone].  If 7PM-7AM, please contact night-coverage [email or web address] Password [email or web address]  02/03/2016, 8:14 PM

## 2016-02-03 NOTE — Telephone Encounter (Signed)
Called spoke with pt's daughter, advised needs INR check since resuming Coumadin. Pt was discharged from hospital on 01/26/16, daughter states pt is having problems secondary to surgery has call into surgeon awaiting call back may be going back to hospital.  Pt has [place] in home at present.  Called [facility] spoke with Jinny Blossom, pt scheduled for [facility] RN visit tomorrow 02/04/16 gave verbal order to check INR tomorrow on 02/04/16 faxed written order to [facility] as well [phone].

## 2016-02-03 NOTE — ED Provider Notes (Signed)
CSN: [id number]     Arrival date & time 02/03/16  1628 History   First MD Initiated Contact with Patient 02/03/16 1639     Chief Complaint  Patient presents with  . Emesis     (Consider location/radiation/quality/duration/timing/severity/associated sxs/prior Treatment) The history is provided by the patient and medical records. No language interpreter was used.     [name] is a 82 y.o. female  with a hx of a-fib, CAD, aortic stenosis, GERD, LE neuropathy, endometrial cancer (s/p hysterectomy and radiation), kidney cancer, HTN, gastroparesis, adenocarcinoma of the stomach (s/p open partial gastrectomy on 01/15/16 and d/c on 01/26/16), CVA, anemia, CKD presents to the Emergency Department complaining of gradual, persistent, progressively worsening dysphagia since her NG tube was removed.  During speech therapy was noted that she was requiring multiple swallows with each bite. Patient reports that she was feeling like food was getting stuck.  Patient's daughter reports the symptoms acutely worsened after dinner last night patient began vomiting. She reports that she continues to feel as if there is a food impaction in her esophagus. She reports that anytime she attempts to drink something her chest burns. She has along history of GERD but never this severe. Patient has had emesis and inability to handle her secretions for approximately 24 hours at this time. Emesis is nonbloody and nonbilious. She has tried Zofran and Phenergan at home without relief. She is also coughing. No fevers or chills, shortness of breath, abdominal pain, diarrhea.     Past Medical History  Diagnosis Date  . PAF (paroxysmal atrial fibrillation) ([place])     controlled with amiodarone, on coumadin  . CAD (coronary artery disease)   . Aortic root dilatation ([place])   . Moderate aortic stenosis   . Chronic anticoagulation   . Tachycardia-bradycardia syndrome ([place])     s/p PPM by Dr Doreatha Lew ([facility]) 07/03/10  . GERD  (gastroesophageal reflux disease)   . Pacemaker     2012  . Neuropathy ([place])     feet/legs  . Restless leg syndrome   . Headache(784.0)     hx migraines - takes Zoloft for migraines  . History of uterine cancer 1980s    treated with hysterectomy, external radiation and radiation seed implants.   . Cancer ([place])     hx Kidney cancer / hx of endometrial cancer  . Anxiety   . Heart murmur   . HTN (hypertension)   . Hyperlipemia   . Hypothyroidism   . Osteomyelitis ([place]) as child  . Family history of anesthesia complication     daughter has ponv  . Syncope 05/28/2014  . Hepatic steatosis   . Elevated LFTs   . Gastroparesis   . Anal fissure   . Radiation proctitis   . Gastric polyp     adenomatous  . Neuromuscular disorder ([facility])     neuropathy in right leg  . Adenocarcinoma of stomach ([place]) 11/11/2015  . PONV (postoperative nausea and vomiting)     30 yrs ago since then no problem  . CHF (congestive heart failure) ([place])   . Pneumonia     hx  . Chronic kidney disease     only has one kidney rt lft rem 03 ca  . 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 Anemia     hx 3/17 iron infusion  . Stroke [facility]) 17    partial stroke left legally blind   Past Surgical History  Procedure Laterality Date  . Cardiac catheterization  2008  .  Nephrectomy Left     renal cell cancer  . Patent ductus arterious repair  age 37  . Ptvp    . Pacemaker insertion  07/04/11    by Dr Deborah Chalk  . Cholecystectomy  1970s  . Upper gastrointestinal endoscopy    . Total knee arthroplasty  02/07/2012    Procedure: TOTAL KNEE ARTHROPLASTY;  Surgeon: Shelda Pal, MD;  Location: [facility];  Service: Orthopedics;  Laterality: Right;  . Insert / replace / remove pacemaker    . Total hip revision Right 2009    initial replacment surg  . I&d knee with poly exchange Right 12/17/2013    Procedure: IRRIGATION AND DEBRIDEMEN RIGHT TOTAL  KNEE WITH POLY EXCHANGE;  Surgeon: Shelda Pal, MD;  Location: [facility];  Service:  Orthopedics;  Laterality: Right;  . Patellar tendon repair Right 03/06/2014    Procedure: AVULSION WITH PATELLA TENDON REPAIR;  Surgeon: Shelda Pal, MD;  Location: [facility];  Service: Orthopedics;  Laterality: Right;  . Total knee revision Right 07/29/2014    Procedure: RIGHT KNEE REVISION OF PREVIOUS REPAIR EXTENSOR MECHANISM;  Surgeon: Shelda Pal, MD;  Location: [facility];  Service: Orthopedics;  Laterality: Right;  . Colonoscopy N/A 11/11/2015    Procedure: COLONOSCOPY;  Surgeon: Iva Boop, MD;  Location: [facility];  Service: Endoscopy;  Laterality: N/A;  . Esophagogastroduodenoscopy N/A 11/11/2015    Procedure: ESOPHAGOGASTRODUODENOSCOPY (EGD);  Surgeon: Iva Boop, MD;  Location: [facility];  Service: Endoscopy;  Laterality: N/A;  . Joint replacement    . Total abdominal hysterectomy  87  . Atril fib    . Laparoscopy N/A 01/15/2016    Procedure: LAPAROSCOPY DIAGNOSTIC;  Surgeon: Almond Lint, MD;  Location: [facility] OR;  Service: General;  Laterality: N/A;  . Gastrectomy N/A 01/15/2016    Procedure: OPEN PARTIAL GASTRECTOMY;  Surgeon: Almond Lint, MD;  Location: [facility] OR;  Service: General;  Laterality: N/A;   Family History  Problem Relation Age of Onset  . Colon cancer Mother 52  . Heart disease Mother   . Heart disease Father   . Heart attack Father   . Heart disease Maternal Grandmother    Social History  Substance Use Topics  . Smoking status: Never Smoker   . Smokeless tobacco: Never Used  . Alcohol Use: No   OB History    No data available     Review of Systems  Constitutional: Negative for fever, diaphoresis, appetite change, fatigue and unexpected weight change.  HENT: Negative for mouth sores.   Eyes: Negative for visual disturbance.  Respiratory: Positive for cough. Negative for chest tightness, shortness of breath and wheezing.   Cardiovascular: Negative for chest pain.  Gastrointestinal: Positive for nausea and vomiting. Negative for abdominal pain,  diarrhea and constipation.  Endocrine: Negative for polydipsia, polyphagia and polyuria.  Genitourinary: Negative for dysuria, urgency, frequency and hematuria.  Musculoskeletal: Negative for back pain and neck stiffness.  Skin: Negative for rash.  Allergic/Immunologic: Negative for immunocompromised state.  Neurological: Negative for syncope, light-headedness and headaches.  Hematological: Does not bruise/bleed easily.  Psychiatric/Behavioral: Negative for sleep disturbance. The patient is not nervous/anxious.       Allergies  Amiodarone; Codeine; Gabapentin; Hydrocodone; Morphine and related; Requip; and Zinc  Home Medications   Prior to Admission medications   Medication Sig Start Date End Date Taking? Authorizing Provider  acetaminophen (TYLENOL) 325 MG tablet Take 2 tablets (650 mg total) by mouth every 4 (four) hours as needed for mild  pain, moderate pain, fever or headache. 01/26/16   Stark Klein, MD  ALPRAZolam Duanne Moron) 0.5 MG tablet TAKE 1/2-1 TABLET BY MOUTH 3 TIMES A DAY AS NEEDED FOR ANXIETY 01/01/16   Courtney Forcucci, PA-C  atenolol (TENORMIN) 100 MG tablet Take 1 tablet (100 mg total) by mouth 2 (two) times daily. 05/19/15   Darlin Coco, MD  B Complex-C (B-COMPLEX WITH VITAMIN C) tablet Take 1 tablet by mouth daily. 12/11/15   [name], MD  Biotin 5000 MCG CAPS Take 10,000 mcg by mouth every morning.     Historical Provider, MD  cetirizine (ZYRTEC) 10 MG tablet Take 10 mg by mouth at bedtime.    Historical Provider, MD  Cholecalciferol (VITAMIN D PO) Take 5,000 Units by mouth every morning.     Historical Provider, MD  Cyanocobalamin 1000 MCG/ML KIT Inject 1,000 mcg into the skin as directed. Patient taking differently: Inject 1,000 mcg into the skin every 30 (thirty) days.  11/24/15   Brunetta Genera, MD  diphenoxylate-atropine (LOMOTIL) 2.5-0.025 MG tablet Take 1 tablet by mouth 4 (four) times daily. 01/26/16   Stark Klein, MD  furosemide (LASIX) 40 MG tablet  TAKE 1 TABLET (40 MG TOTAL) BY MOUTH DAILY. Patient taking differently: TAKE 1/2 TABLET (20 MG TOTAL) BY MOUTH DAILY. 12/11/15   [name], MD  guaiFENesin (MUCINEX) 600 MG 12 hr tablet Take 1,200 mg by mouth 2 (two) times daily as needed for cough or to loosen phlegm.     Historical Provider, MD  levothyroxine (SYNTHROID, LEVOTHROID) 100 MCG tablet TAKE 1 TABLET BY MOUTH EVERY DAY ON EMPTY STOMACH WITH A FULL GLASS OF WATER Patient taking differently: TAKE 1/2 TABLET (50 MCG TOTAL) BY MOUTH EVERY DAY ON EMPTY STOMACH WITH A FULL GLASS OF WATER 10/25/15   [name], MD  losartan (COZAAR) 50 MG tablet Take 1 tablet (50 mg total) by mouth daily. 11/26/15   Thompson Grayer, MD  LYRICA 75 MG capsule TAKE ONE CAPSULE BY MOUTH TWICE A DAY 11/29/15   [name], MD  methocarbamol (ROBAXIN) 500 MG tablet Take 1 tablet (500 mg total) by mouth every 8 (eight) hours as needed for muscle spasms. 01/26/16   Stark Klein, MD  nitroGLYCERIN (NITROSTAT) 0.4 MG SL tablet Place 1 tablet (0.4 mg total) under the tongue every 5 (five) minutes as needed for chest pain (MAX 3 TABLETS). 05/19/15   Darlin Coco, MD  ondansetron (ZOFRAN-ODT) 4 MG disintegrating tablet Take 1 tablet (4 mg total) by mouth every 6 (six) hours as needed for nausea. 01/26/16   Stark Klein, MD  pantoprazole (PROTONIX) 40 MG tablet TAKE 1 TABLET (40 MG TOTAL) BY MOUTH 2 (TWO) TIMES DAILY. 12/11/15   [name], MD  potassium chloride SA (K-DUR,KLOR-CON) 20 MEQ tablet Take 20 mEq by mouth 2 (two) times daily.    Historical Provider, MD  sertraline (ZOLOFT) 50 MG tablet Take 1 tablet (50 mg total) by mouth daily. For mood 09/23/15   [name], MD  sucralfate (CARAFATE) 1 GM/10ML suspension Take 10 mLs (1 g total) by mouth 4 (four) times daily -  with meals and at bedtime. 01/26/16   Stark Klein, MD  traMADol (ULTRAM) 50 MG tablet Take 1 tablet (50 mg total) by mouth every 6 (six) hours as needed (pain). Reported on 10/14/2015 01/26/16    Stark Klein, MD  triamcinolone cream (KENALOG) 0.1 % APPLY TO AFFECTED AREA TWICE A DAY 04/09/15   [name], MD  warfarin (COUMADIN) 2 MG tablet TAKE  1 TABLET DAILY OR AS DIRECTED 01/26/16   [name], MD   BP 159/100 mmHg  Pulse 104  Temp(Src) 98.8 F (37.1 C) (Oral)  Resp 16  Ht '5\' 1"'$  (1.549 m)  Wt 59.875 kg  BMI 24.95 kg/m2  SpO2 98%  LMP  (Exact Date) Physical Exam  Constitutional: She appears well-developed and well-nourished. No distress.  Awake, alert, nontoxic appearance  HENT:  Head: Normocephalic and atraumatic.  Mouth/Throat: Oropharynx is clear and moist. No oropharyngeal exudate.  Eyes: Conjunctivae are normal. No scleral icterus.  Neck: Normal range of motion. Neck supple.  Cardiovascular: Normal heart sounds and intact distal pulses.  An irregularly irregular rhythm present. Tachycardia present.   Pulses:      Radial pulses are 2+ on the right side, and 2+ on the left side.       Dorsalis pedis pulses are 2+ on the right side, and 2+ on the left side.  Pulmonary/Chest: Effort normal and breath sounds normal. No respiratory distress. She has no decreased breath sounds. She has no wheezes. She exhibits no tenderness.  Equal chest expansion  Abdominal: Soft. Bowel sounds are normal. She exhibits no mass. There is no tenderness. There is no rebound and no guarding.  Musculoskeletal: Normal range of motion. She exhibits no edema.  Neurological: She is alert.  Speech is clear and goal oriented Moves extremities without ataxia  Skin: Skin is warm and dry. She is not diaphoretic.  Psychiatric: She has a normal mood and affect.  Nursing note and vitals reviewed.   ED Course  Procedures (including critical care time) Labs Review Labs Reviewed  COMPREHENSIVE METABOLIC PANEL - Abnormal; Notable for the following:    Sodium 147 (*)    Potassium 3.4 (*)    Chloride 115 (*)    CO2 21 (*)    Glucose, Bld 126 (*)    Creatinine, Ser 1.06 (*)    ALT 12 (*)     GFR calc non Af Amer 49 (*)    GFR calc Af Amer 56 (*)    All other components within normal limits  CBC - Abnormal; Notable for the following:    RDW 17.8 (*)    All other components within normal limits  PROTIME-INR - Abnormal; Notable for the following:    Prothrombin Time 29.3 (*)    INR 2.84 (*)    All other components within normal limits  LIPASE, BLOOD  URINALYSIS, ROUTINE W REFLEX MICROSCOPIC (NOT AT [facility])  MAGNESIUM  PHOSPHORUS  I-STAT TROPOININ, ED    Imaging Review Dg Abd Acute W/chest  02/03/2016  CLINICAL DATA:  Vomiting after stomach surgery 01/15/2016. Partial gastrectomy for cancer. EXAM: DG ABDOMEN ACUTE W/ 1V CHEST COMPARISON:  11/12/2015 abdominal CT FINDINGS: Sutures in the left upper quadrant correlating with partial gastrectomy. Study is limited by gasless abdomen. No dilated loops are seen. No evidence of pneumoperitoneum. Trace left effusion. No pneumonia or edema. No cardiomegaly. Dual-chamber pacer from the right with leads in unremarkable position. IMPRESSION: 1. Gasless abdomen, a nonspecific finding that limits sensitivity. No indication of bowel obstruction or perforation. 2. Trace left effusion. Electronically Signed   By: Monte Fantasia M.D.   On: 02/03/2016 18:21   I have personally reviewed and evaluated these images and lab results as part of my medical decision-making.   EKG Interpretation   Date/Time:  Tuesday Feb 03 2016 17:23:23 EDT Ventricular Rate:  125 PR Interval:    QRS Duration: 87 QT Interval:  338 QTC  Calculation: 487 R Axis:   41 Text Interpretation:  Atrial fibrillation Nonspecific repol abnormality,  diffuse leads Confirmed by Hazle Coca [phone]) on 02/03/2016 5:30:09 PM      MDM   Final diagnoses:  Esophagitis  S/P partial gastrectomy  Intractable vomiting with nausea, vomiting of unspecified type   [name] presents with persistent vomiting. Patient is status post open partial gastrectomy. Her abdomen is soft and  nontender. A more concerned about esophagitis with stricture or impacted food bolus.  Patient is significantly dehydrated. Fluid bolus being given. Negative troponin. EKG shows persistent A. fib. She is therapeutic on her Coumadin with an INR 2.8.  Acute abdominal films show a gasless abdomen and no evidence of obstruction.  6:41 PM Discussed with Dr. Henrene Pastor who requests Barium swallow.  They will not plan to scope tonight.    7:23 PM Discussed with Dr. Rosendo Gros of general surgery who will evaluate.    Pt will need medicine admission.  The patient was discussed with and seen by Dr. Ralene Bathe who agrees with the treatment plan.  7:47 PM Discussed with Dr. Sandi Mariscal who reports that pt continues to vomit and will be unable to tolerate the test at this time.  Will admit for further antiemetics and emesis control to attempt to obtain testing.    8:04 PM Discussed with Dr. Olevia Bowens who will admit.  Jarrett Soho Ardit Danh, PA-C 02/03/16 2004  Quintella Reichert, MD 02/04/16 [phone]

## 2016-02-04 ENCOUNTER — Observation Stay (HOSPITAL_COMMUNITY): Payer: Medicare Other

## 2016-02-04 ENCOUNTER — Telehealth: Payer: Self-pay | Admitting: Genetic Counselor

## 2016-02-04 ENCOUNTER — Encounter (HOSPITAL_COMMUNITY): Payer: Self-pay | Admitting: General Practice

## 2016-02-04 DIAGNOSIS — L8990 Pressure ulcer of unspecified site, unspecified stage: Secondary | ICD-10-CM | POA: Diagnosis present

## 2016-02-04 DIAGNOSIS — N390 Urinary tract infection, site not specified: Secondary | ICD-10-CM

## 2016-02-04 DIAGNOSIS — K259 Gastric ulcer, unspecified as acute or chronic, without hemorrhage or perforation: Secondary | ICD-10-CM | POA: Diagnosis not present

## 2016-02-04 DIAGNOSIS — E860 Dehydration: Secondary | ICD-10-CM | POA: Diagnosis not present

## 2016-02-04 DIAGNOSIS — I482 Chronic atrial fibrillation: Secondary | ICD-10-CM | POA: Diagnosis not present

## 2016-02-04 DIAGNOSIS — R1110 Vomiting, unspecified: Secondary | ICD-10-CM | POA: Diagnosis not present

## 2016-02-04 DIAGNOSIS — K209 Esophagitis, unspecified: Secondary | ICD-10-CM | POA: Diagnosis not present

## 2016-02-04 DIAGNOSIS — Z7901 Long term (current) use of anticoagulants: Secondary | ICD-10-CM | POA: Diagnosis not present

## 2016-02-04 DIAGNOSIS — E876 Hypokalemia: Secondary | ICD-10-CM

## 2016-02-04 DIAGNOSIS — I10 Essential (primary) hypertension: Secondary | ICD-10-CM | POA: Diagnosis not present

## 2016-02-04 DIAGNOSIS — E43 Unspecified severe protein-calorie malnutrition: Secondary | ICD-10-CM | POA: Diagnosis not present

## 2016-02-04 DIAGNOSIS — C169 Malignant neoplasm of stomach, unspecified: Secondary | ICD-10-CM | POA: Diagnosis not present

## 2016-02-04 DIAGNOSIS — K210 Gastro-esophageal reflux disease with esophagitis: Secondary | ICD-10-CM | POA: Diagnosis not present

## 2016-02-04 DIAGNOSIS — I4891 Unspecified atrial fibrillation: Secondary | ICD-10-CM | POA: Diagnosis not present

## 2016-02-04 DIAGNOSIS — J181 Lobar pneumonia, unspecified organism: Secondary | ICD-10-CM | POA: Diagnosis not present

## 2016-02-04 DIAGNOSIS — E870 Hyperosmolality and hypernatremia: Secondary | ICD-10-CM

## 2016-02-04 LAB — COMPREHENSIVE METABOLIC PANEL
ALT: 13 U/L — AB (ref 14–54)
ANION GAP: 9 (ref 5–15)
AST: 20 U/L (ref 15–41)
Albumin: 3.3 g/dL — ABNORMAL LOW (ref 3.5–5.0)
Alkaline Phosphatase: 83 U/L (ref 38–126)
BUN: 17 mg/dL (ref 6–20)
CHLORIDE: 117 mmol/L — AB (ref 101–111)
CO2: 22 mmol/L (ref 22–32)
CREATININE: 1 mg/dL (ref 0.44–1.00)
Calcium: 9.4 mg/dL (ref 8.9–10.3)
GFR calc non Af Amer: 52 mL/min — ABNORMAL LOW (ref >60)
Glucose, Bld: 133 mg/dL — ABNORMAL HIGH (ref 65–99)
POTASSIUM: 3.4 mmol/L — AB (ref 3.5–5.1)
SODIUM: 148 mmol/L — AB (ref 135–145)
Total Bilirubin: 0.7 mg/dL (ref 0.3–1.2)
Total Protein: 6.5 g/dL (ref 6.5–8.1)

## 2016-02-04 LAB — PROTIME-INR
INR: 3.22 — ABNORMAL HIGH (ref 0.00–1.49)
Prothrombin Time: 32.3 seconds — ABNORMAL HIGH (ref 11.6–15.2)

## 2016-02-04 LAB — URINE MICROSCOPIC-ADD ON

## 2016-02-04 LAB — CBC WITH DIFFERENTIAL/PLATELET
Basophils Absolute: 0.1 10*3/uL (ref 0.0–0.1)
Basophils Relative: 1 %
Eosinophils Absolute: 0.2 10*3/uL (ref 0.0–0.7)
Eosinophils Relative: 3 %
HEMATOCRIT: 40.6 % (ref 36.0–46.0)
HEMOGLOBIN: 12.6 g/dL (ref 12.0–15.0)
LYMPHS ABS: 1.3 10*3/uL (ref 0.7–4.0)
LYMPHS PCT: 15 %
MCH: 28.8 pg (ref 26.0–34.0)
MCHC: 31 g/dL (ref 30.0–36.0)
MCV: 92.7 fL (ref 78.0–100.0)
Monocytes Absolute: 0.7 10*3/uL (ref 0.1–1.0)
Monocytes Relative: 8 %
NEUTROS ABS: 6.2 10*3/uL (ref 1.7–7.7)
NEUTROS PCT: 73 %
Platelets: 252 10*3/uL (ref 150–400)
RBC: 4.38 MIL/uL (ref 3.87–5.11)
RDW: 17.7 % — ABNORMAL HIGH (ref 11.5–15.5)
WBC: 8.4 10*3/uL (ref 4.0–10.5)

## 2016-02-04 LAB — URINALYSIS, ROUTINE W REFLEX MICROSCOPIC
BILIRUBIN URINE: NEGATIVE
GLUCOSE, UA: NEGATIVE mg/dL
Ketones, ur: NEGATIVE mg/dL
NITRITE: POSITIVE — AB
Protein, ur: 30 mg/dL — AB
Specific Gravity, Urine: 1.023 (ref 1.005–1.030)
pH: 5.5 (ref 5.0–8.0)

## 2016-02-04 MED ORDER — MAGNESIUM SULFATE 2 GM/50ML IV SOLN
2.0000 g | Freq: Once | INTRAVENOUS | Status: AC
  Administered 2016-02-04: 2 g via INTRAVENOUS
  Filled 2016-02-04: qty 50

## 2016-02-04 MED ORDER — HYDRALAZINE HCL 20 MG/ML IJ SOLN: 10.0000 mg | INTRAMUSCULAR | Status: DC | PRN

## 2016-02-04 MED ORDER — DEXTROSE 5 % IV SOLN
1.0000 g | INTRAVENOUS | Status: DC
  Administered 2016-02-04 – 2016-02-06 (×3): 1 g via INTRAVENOUS
  Filled 2016-02-04 (×4): qty 10

## 2016-02-04 MED ORDER — LEVOTHYROXINE SODIUM 100 MCG IV SOLR
25.0000 ug | Freq: Every day | INTRAVENOUS | Status: DC
  Administered 2016-02-04 – 2016-02-09 (×6): 25 ug via INTRAVENOUS
  Filled 2016-02-04 (×6): qty 5

## 2016-02-04 MED ORDER — VITAMIN K1 10 MG/ML IJ SOLN
5.0000 mg | Freq: Once | INTRAMUSCULAR | Status: AC
  Administered 2016-02-04: 5 mg via SUBCUTANEOUS
  Filled 2016-02-04: qty 0.5

## 2016-02-04 NOTE — Telephone Encounter (Signed)
[name] called and left a message that [name] is currently at [facility] and will not be able to make her appointment tomorrow (May 31st).  She verified her DOB, and she left a phone number to contact her with any questions--[phone].

## 2016-02-04 NOTE — Progress Notes (Addendum)
PROGRESS NOTE                                                                                                                                                                                                             Patient Demographics:    [name], is a 82 y.o. female, DOB - [birth year], [id number]  Admit date - 02/03/2016   Admitting Physician [name], MD  Outpatient Primary MD for the patient is [name], MD  LOS -   Outpatient Specialists: [name]  Chief Complaint  Patient presents with  . Emesis       Brief Narrative   82 year old female with paroxysmal A. fib, CAD with history of CABG, moderate BS, tachycardia-bradycardia syndrome status post pacemaker, GERD, peripheral neuropathy, hypertension, hypothyroidism, CVA with left eye blindness, recently diagnosed adenocarcinoma of the stomach status post gastrectomy done earlier this month by [name] presented to the ED with nausea with multiple episodes of vomiting for 1 day.. Patient reported dysphagia as well. Reported frequent loose stools without worsening symptoms. No melena or bright red blood per rectum. Denies fever or chills, headache, shortness of breath or chest pain. Denied dysuria. Abdominal x-ray negative for obstruction. GI and surgery consulted.   Subjective:   Patient remains nothing by mouth and denies further nausea or vomiting. Denies abdominal pain.   Assessment  & Plan :    Principal Problem:   Intractable nausea and vomiting No signs of bowel obstruction. Patient subtotal gastrectomy for gastric adenocarcinoma. Has history of gastroparesis. Barium esophagogram done showed press by esophagus with contrast passing into the proximal small bowel. GI consult appreciated with plan on EGD tomorrow if INR <2. IV PPI twice a day.   Active Problems: UTI Patient denies dysuria. Will treat her with empiric  Rocephin.      Hypothyroidism Resume Synthroid at IV dose.  Uncontrolled hypertension Blood pressure elevated.. Home oral medication on hold.. Will place on when necessary hydralazine IV.     Adenocarcinoma of stomach ([place]) Status post subtotal gastrectomy recently.    Chronic atrial fibrillation (HCC) Hold Coumadin. Reverse INR with vitamin K for EGD.  Hypokalemia/hyponatremia   secondary to dehydration Replenishing with fluids   CAD with history of CABG     Code Status : Full code  Family Communication  : Daughter is at bedside  Disposition Plan  : Pending clinical workup and improvement  Barriers For Discharge : Active symptoms. Pending EGD  Consults  :   [facility]  Procedures  :  Abdominal x-ray  barium esophagogram  DVT Prophylaxis  :  Supratherapeutic INR  Lab Results  Component Value Date   PLT 252 02/04/2016    Antibiotics  :    Anti-infectives    Start     Dose/Rate Route Frequency Ordered Stop   02/04/16 1600  cefTRIAXone (ROCEPHIN) 1 g in dextrose 5 % 50 mL IVPB     1 g 100 mL/hr over 30 Minutes Intravenous Every 24 hours 02/04/16 1553          Objective:   Filed Vitals:   02/03/16 2100 02/03/16 2136 02/04/16 0521 02/04/16 1419  BP:  146/89 147/86 167/71  Pulse:  104 114 119  Temp:  98.4 F (36.9 C) 97.6 F (36.4 C) 99 F (37.2 C)  TempSrc:  Oral Oral Oral  Resp:      Height: 5\' 1"  (1.549 m)     Weight: 60.8 kg (134 lb 0.6 oz)     SpO2:  97% 97% 97%    Wt Readings from Last 3 Encounters:  02/03/16 60.8 kg (134 lb 0.6 oz)  01/17/16 73.846 kg (162 lb 12.8 oz)  01/07/16 65.318 kg (144 lb)     Intake/Output Summary (Last 24 hours) at 02/04/16 1553 Last data filed at 02/04/16 1504  Gross per 24 hour  Intake   1670 ml  Output    450 ml  Net   1220 ml     Physical Exam  Gen: not in distress HEENT: no pallor, Dry mucosa, supple neck Chest: clear b/l, no added sounds CVS: N S1&S2, no murmurs, rubs or gallop GI:  soft, NT, ND, BS+ Musculoskeletal: warm, no edema CNS: Alert and oriented, non focal    Data Review:    CBC  Recent Labs Lab 02/03/16 1655 02/04/16 0454  WBC 8.3 8.4  HGB 13.4 12.6  HCT 42.6 40.6  PLT 261 252  MCV 92.4 92.7  MCH 29.1 28.8  MCHC 31.5 31.0  RDW 17.8* 17.7*  LYMPHSABS  --  1.3  MONOABS  --  0.7  EOSABS  --  0.2  BASOSABS  --  0.1    Chemistries   Recent Labs Lab 02/03/16 1655 02/04/16 0454  NA 147* 148*  K 3.4* 3.4*  CL 115* 117*  CO2 21* 22  GLUCOSE 126* 133*  BUN 18 17  CREATININE 1.06* 1.00  CALCIUM 9.5 9.4  MG 1.7  --   AST 19 20  ALT 12* 13*  ALKPHOS 90 83  BILITOT 0.7 0.7   ------------------------------------------------------------------------------------------------------------------ No results for input(s): CHOL, HDL, LDLCALC, TRIG, CHOLHDL, LDLDIRECT in the last 72 hours.  Lab Results  Component Value Date   HGBA1C 5.7* 10/09/2015   ------------------------------------------------------------------------------------------------------------------ No results for input(s): TSH, T4TOTAL, T3FREE, THYROIDAB in the last 72 hours.  Invalid input(s): FREET3 ------------------------------------------------------------------------------------------------------------------ No results for input(s): VITAMINB12, FOLATE, FERRITIN, TIBC, IRON, RETICCTPCT in the last 72 hours.  Coagulation profile  Recent Labs Lab 02/03/16 1655 02/04/16 0454  INR 2.84* 3.22*    No results for input(s): DDIMER in the last 72 hours.  Cardiac Enzymes No results for input(s): CKMB, TROPONINI, MYOGLOBIN in the last 168 hours.  Invalid input(s): CK ------------------------------------------------------------------------------------------------------------------ No results found for: BNP  Inpatient Medications  Scheduled Meds: . cefTRIAXone (ROCEPHIN)  IV  1 g Intravenous Q24H  .  famotidine (PEPCID) IV  20 mg Intravenous Q24H  . fentaNYL (SUBLIMAZE)  injection  25 mcg Intravenous Once  . metoCLOPramide (REGLAN) injection  10 mg Intravenous Q6H  . metoprolol  5 mg Intravenous Q6H  . pantoprazole (PROTONIX) IV  40 mg Intravenous Q12H  . sodium chloride flush  3 mL Intravenous Q12H   Continuous Infusions: . 0.45 % NaCl with KCl 20 mEq / L 100 mL/hr at 02/04/16 1131   PRN Meds:.diphenhydrAMINE, LORazepam, prochlorperazine  Micro Results No results found for this or any previous visit (from the past 240 hour(s)).  Radiology Reports Dg Esophagus  02/04/2016  CLINICAL DATA:  Vomiting for 2 days 3 weeks status post partial gastrectomy. Evaluate for food impaction. EXAM: ESOPHOGRAM/BARIUM SWALLOW TECHNIQUE: Single contrast examination was performed using  thin barium. FLUOROSCOPY TIME:  Radiation Exposure Index (as provided by the fluoroscopic device): 238.9 uGy*m2 If the device does not provide the exposure index: Fluoroscopy Time:  54 seconds Number of Acquired Images:  Fluoro only. COMPARISON:  Postoperative upper GI series 01/23/2016. FINDINGS: Study was performed in the LPO and right lateral decubitus positions. The patient swallowed the barium without difficulty. There is mild presbyesophagus. No evidence of food impaction, mucosal ulceration or stricture. Contrast empties into the gastric pouch and proximal small bowel. No evidence of contrast leak. No laryngeal penetration observed. IMPRESSION: No evidence of food impaction, obstruction or extravasation. Presbyesophagus. Electronically Signed   By: Richardean Sale M.D.   On: 02/04/2016 08:50   Dg Abd Acute W/chest  02/03/2016  CLINICAL DATA:  Vomiting after stomach surgery 01/15/2016. Partial gastrectomy for cancer. EXAM: DG ABDOMEN ACUTE W/ 1V CHEST COMPARISON:  11/12/2015 abdominal CT FINDINGS: Sutures in the left upper quadrant correlating with partial gastrectomy. Study is limited by gasless abdomen. No dilated loops are seen. No evidence of pneumoperitoneum. Trace left effusion. No  pneumonia or edema. No cardiomegaly. Dual-chamber pacer from the right with leads in unremarkable position. IMPRESSION: 1. Gasless abdomen, a nonspecific finding that limits sensitivity. No indication of bowel obstruction or perforation. 2. Trace left effusion. Electronically Signed   By: Monte Fantasia M.D.   On: 02/03/2016 18:21   Dg Duanne Limerick W/water Sol Cm  01/23/2016  CLINICAL DATA:  Status post partial gastrectomy.  Evaluate for leak. EXAM: WATER SOLUBLE UPPER GI SERIES TECHNIQUE: Single-column upper GI series was performed using water soluble contrast. CONTRAST:  1 ISOVUE-300 IOPAMIDOL (ISOVUE-300) INJECTION 61% COMPARISON:  01/18/2016 FLUOROSCOPY TIME:  Radiation Exposure Index (as provided by the fluoroscopic device): Not available If the device does not provide the exposure index: Fluoroscopy Time (in minutes and seconds):  1 minutes 18 seconds Number of Acquired Images:  1, the rest saved fluoroscopy FINDINGS: Scout images over the chest epigastrium were obtained. Patient had to take swallows LPO. In this position there was no emptying of the remnant stomach with frequent large volume gastroesophageal reflux. Edema and obstruction at the gastro enterostomy with expected, but when supine and RPO the stomach drained readily. Negative for leak. The duodenal C-loop opacified and is unremarkable. IMPRESSION: 1. Negative for postoperative leak or obstruction. 2. Positional drainage of the remnant stomach. When LPO there was no drainage and large volume, symptomatic gastroesophageal reflux. 3. Distal esophagitis related to #2 or recent NG tube. Electronically Signed   By: Monte Fantasia M.D.   On: 01/23/2016 09:49   Dg Duanne Limerick W/water Sol Cm  01/18/2016  CLINICAL DATA:  82 year old female status post partial gastrectomy. Evaluate for potential postoperative leak or obstruction. EXAM: WATER  SOLUBLE UPPER GI SERIES TECHNIQUE: Single-column upper GI series was performed using water soluble contrast. CONTRAST:  60 mL  of Gastrografin COMPARISON:  No priors. FLUOROSCOPY TIME:  If the device does not provide the exposure index: Fluoroscopy Time (in minutes and seconds):  1 minutes and 18 seconds Number of Acquired Images:  5 series with multiple images. FINDINGS: Initial image demonstrated a nasogastric a tube in place with tip in the gastric remnant and side-port in the distal esophagus. Midline skin staples are noted. Pacemaker leads extending over the expected location of the right atrial appendage and right ventricular apex. Gastrografin was infused via the patient's indwelling NG tube, which demonstrated a small gastric remnant. There was gastroesophageal reflux into the esophagus throughout the examination. Contrast readily traversed the pylorus into the proximal small bowel. There was no extravasation of contrast material at any point during the examination. IMPRESSION: 1. Expected postoperative appearance of the stomach following partial gastrectomy, without evidence of postoperative leak or obstruction. These results will be called to the ordering clinician or representative by the Radiologist Assistant, and communication documented in the PACS or zVision Dashboard. Electronically Signed   By: [name] M.D.   On: 01/18/2016 15:59    Time Spent in minutes  25   Louellen Molder M.D on 02/04/2016 at 3:53 PM  Between 7am to 7pm - Pager - [phone]  After 7pm go to [email or web address] - password [facility]  [facility] -  Office  [phone]

## 2016-02-04 NOTE — Progress Notes (Signed)
Initial Nutrition Assessment  DOCUMENTATION CODES:   Not applicable  INTERVENTION:   -RD will follow for diet advancement and supplement diet as appropriate  NUTRITION DIAGNOSIS:   Inadequate oral intake related to altered GI function as evidenced by NPO status.  GOAL:   Patient will meet greater than or equal to 90% of their needs  MONITOR:   Diet advancement, Labs, Weight trends, Skin, I & O's  REASON FOR ASSESSMENT:   Malnutrition Screening Tool    ASSESSMENT:   [name] is a 82 y.o. female with medical history significant of paroxysmal atrial fibrillation, CABG, aortic root dilation, moderate aortic stenosis, tachycardia-bradycardia syndrome on10/28/2011 (status post pacemaker placement in 2012), GERD, peripheral neuropathy, restless leg syndrome, hypertension, hyperlipidemia, hypothyroidism, CVA with left eye blindness, recently diagnosed with adenocarcinoma of the stomach and is S/P gastrectomy earlier this month by [name] who comes to the emergency department with complaints of nausea and multiple episodes of emesis since yesterday evening.  Pt admitted with intractable nausea and vomiting.   Pt familiar to this RD, due to recent admissions for gastric cancer s/p partial gastrectomy.   Hx obtained from pt's tow daughter at bedside. They report that pt has struggled with poor intake since discharge 1.5 weeks ago. They reveal that pt has been unable to keep foods and liquids down for 2 days PTA. Intake has been poor ever since hospital discharge. They reveals that SLP came to their home last week and recommended double swallow to help with indigestion and sensation of food being stuck. Pt daughters have ordered Magic Cups and a whey protein powder provided by SLP.   Pt daughter reports pt has experienced 10# wt loss over the past 1-2 weeks. Suspect some weight loss is related to dehydration.   Unable to complete Nutrition-Focused physical exam at this time. Pt  daughters request that this RD not disturb pt, as she just started to rest. Pt had a difficult night, vomiting every 5 minutes per pt daughters.   Per chart review, no oncology follow-up yet.   Case discussed with RN. Pt was just approved to receive ice chips (which pt tolerated per daughters). Pt underwent barium swallow today (WDL), but holding off on endoscopic studies, due to increased INR.   Labs reviewed: Na: 145, K: 3.4.   Diet Order:  Diet NPO time specified  Skin:  Wound (see comment) (stage I sacrum)  Last BM:  02/03/16  Height:   Ht Readings from Last 1 Encounters:  02/03/16 5\' 1"  (1.549 m)    Weight:   Wt Readings from Last 1 Encounters:  02/03/16 134 lb 0.6 oz (60.8 kg)    Ideal Body Weight:  47.7 kg  BMI:  Body mass index is 25.34 kg/(m^2).  Estimated Nutritional Needs:   Kcal:  1600-1800  Protein:  75-90 grams  Fluid:  1.6-1.8 L  EDUCATION NEEDS:   No education needs identified at this time  Nixon Sparr A. Jimmye Norman, RD, LDN, CDE Pager: [phone] After hours Pager: [phone]

## 2016-02-04 NOTE — Care Management Obs Status (Signed)
MEDICARE OBSERVATION STATUS NOTIFICATION   Patient Details  Name: [name] MRN: [id number] Date of Birth: Jan 17, 1934   Medicare Observation Status Notification Given:  Yes (Pt admitted s/p subtotal gastrectomy for gastric ca. Pt with several day h/o of n/v. Pt admitted with dehydration)    Marilu Favre, RN 02/04/2016, 11:26 AM

## 2016-02-04 NOTE — Consult Note (Signed)
                                                                           [facility] Consult: 10:07 AM 02/04/2016     Referring Provider: Dr Dhungel  Primary Care Physician:  MCKEOWN,WILLIAM DAVID, MD Primary Gastroenterologist:  Dr. Gessner, previously Dr Kaplan.    Reason for Consultation:  Nausea, vomiting.    HPI: [name] is a 82 y.o. female Jehov[employer]'s Witness.  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  S/p repair patent ductus arteriosis.  Aortic stenosis.  CHF, EF 60-65 % in 11/1015.  S/p CABG.  2015 left retinal artery occlusion with vision loss.   S/p multiple orthopedic surgeries and joint replacements.  PAF, on chronic Coumadin.  CKD stage 3.  HTN.  Hypothyroidism.  Strep [place] PNA 11/2015.  Hx renal cell cancer, s/p nephrectomy.  Hx uterine cancer, s/p hysterectomy and radiation.  S/p cholecystectomy.  Enterococcal UTI treated with Amoxicillin in 11/2015.   2008 gastric emptying study showed 63% retained gastric contents at 2 hours, normal being less than 30% at 2 hours.    Anemia, FOBT + with Hgb to 7.9 in 11/2015.  Received parenteral iron, started on Aranesp and B12 injections per oncology.  11/11/15 EGD.  Gastric tumor at anterior wall, oozing blood and stigmata of recent bleeding.  Path: atypical glandular tissue, suspicious for adenocarcinoma.   11/11/15 Colonoscopy.  Normal colon except for decreased rectal sphincter tone.  No mets on CT.  PET scan showed gastric lesion and hypermetabollic inguinal nodes.    01/15/16 Diagnostic laparoscopy, open subtotal gastrectomy.  Pathology: poorly differentiated adenocarcinoma. Dysphagia post removal of NGT post op requiring multiple swallows to pass foods.  Not seen by SLP .  Also anorexia, early satiety.  5/14 UGI series: Expected postoperative appearance of the stomach following partial gastrectomy, without evidence of  postoperative leak or obstruction. 5/19 UGI series: 1. Negative for postoperative leak or obstruction.  2. Positional drainage of the remnant stomach. When LPO there was no drainage and large volume, symptomatic gastroesophageal reflux.  3. Distal esophagitis related to #2 or recent NG tube.  Discharged after 5/11 - 01/26/16 admission on Coumadin, BID Protonix, QID Sucralfate, full liquid/soft diet, Ensure and protein supplements.  Protein cal malnutrition noted in discharge summary.  Pt has not been back to oncologist, Dr Kale, since the gastrectomy.  At home this month, seen as outpt by SLP, who noted pt was having to perform multiple swallows to try to get food to move.  Pt and dtrs observation is that she ends up regurgitating, "spitting up" frothy or yellow/brown sputum after attempts to swallow even water.  Even without po, pt is coughing and regurgitating sputum.  This is worse when she lays flat, so she has been sleeping in lounge chair.  Zofran and Phenergan added to PPI and Carafate but the problem persisted.  2 days ago she developed nausea and true vomiting, no CG or bloody material.  Wt loss of 10 # since her 01/2016 discharge.   Came to ED last night with weakness and the N/V.  Stools are soft 5 to 6 times per day but this has been her normal baseline since the radiation   for uterine cancer. .   5/30 abdominal xray: Gasless abdomen, a nonspecific finding that limits sensitivity.  No indication of bowel obstruction or perforation. 5/31 Esophagram: Mild Presbyesophagus, no obstruction, no leak, no food impaction.  q 6 hour Reglan IV started, 3 doses so far.    Past Medical History  Diagnosis Date  . PAF (paroxysmal atrial fibrillation) ([place])     controlled with amiodarone, on coumadin  . CAD (coronary artery disease)   . Aortic root dilatation ([place])   . Moderate aortic stenosis   . Chronic anticoagulation   . Tachycardia-bradycardia syndrome ([place])     s/p PPM by Dr Doreatha Lew (MDT)  07/03/10  . GERD (gastroesophageal reflux disease)   . Pacemaker     2012  . Neuropathy ([place])     feet/legs  . Restless leg syndrome   . Headache(784.0)     hx migraines - takes Zoloft for migraines  . History of uterine cancer 1980s    treated with hysterectomy, external radiation and radiation seed implants.   . Cancer ([place])     hx Kidney cancer / hx of endometrial cancer  . Anxiety   . Heart murmur   . HTN (hypertension)   . Hyperlipemia   . Hypothyroidism   . Osteomyelitis ([place]) as child  . Family history of anesthesia complication     daughter has ponv  . Syncope 05/28/2014  . Elevated LFTs     hepatic steatosis  . Gastroparesis   . Anal fissure   . Radiation proctitis   . Neuromuscular disorder ([facility])     neuropathy in right leg  . Adenocarcinoma of stomach ([place]) 11/11/2015    gastric mass on egd  . PONV (postoperative nausea and vomiting)     30 yrs ago since then no problem  . CHF (congestive heart failure) ([place])   . Pneumonia     hx  . Chronic kidney disease     only has one kidney rt lft rem 03 ca  . 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 Anemia     hx 3/17 iron infusion, on aranesp and injected B12 since 11/2015.   . Stroke ([place]) 17    partial stroke left legally blind  . Nausea 01/2016  . Transfusion of blood product refused for religious reason     Past Surgical History  Procedure Laterality Date  . Cardiac catheterization  2008  . Nephrectomy Left     renal cell cancer  . Patent ductus arterious repair  ~ 1949  . Ptvp    . Cholecystectomy  1970s  . Upper gastrointestinal endoscopy    . Total knee arthroplasty  02/07/2012    Procedure: TOTAL KNEE ARTHROPLASTY;  Surgeon: Mauri Pole, MD;  Location: [facility];  Service: Orthopedics;  Laterality: Right;  . Total hip revision Right 2009    initial replacment surg  . I&d knee with poly exchange Right 12/17/2013    Procedure: IRRIGATION AND DEBRIDEMEN RIGHT TOTAL  KNEE WITH POLY EXCHANGE;  Surgeon: Mauri Pole, MD;   Location: [facility];  Service: Orthopedics;  Laterality: Right;  . Patellar tendon repair Right 03/06/2014    Procedure: AVULSION WITH PATELLA TENDON REPAIR;  Surgeon: Mauri Pole, MD;  Location: [facility];  Service: Orthopedics;  Laterality: Right;  . Total knee revision Right 07/29/2014    Procedure: RIGHT KNEE REVISION OF PREVIOUS REPAIR EXTENSOR MECHANISM;  Surgeon: Mauri Pole, MD;  Location: [facility];  Service: Orthopedics;  Laterality: Right;  .  Colonoscopy N/A 11/11/2015    Procedure: COLONOSCOPY;  Surgeon: [name], MD;  Location: [facility];  Service: Endoscopy;  Laterality: N/A;  . Esophagogastroduodenoscopy N/A 11/11/2015    Procedure: ESOPHAGOGASTRODUODENOSCOPY (EGD);  Surgeon: [name], MD;  Location: [facility];  Service: Endoscopy;  Laterality: N/A;  . Total abdominal hysterectomy  87  . Laparoscopy N/A 01/15/2016    Procedure: LAPAROSCOPY DIAGNOSTIC;  Surgeon: Anda Sobotta Klein, MD;  Location: [facility];  Service: General;  Laterality: N/A;  . Gastrectomy N/A 01/15/2016    Procedure: OPEN PARTIAL GASTRECTOMY;  Surgeon: Alliene Klugh Klein, MD;  Location: [facility];  Service: General;  Laterality: N/A;  . Insert / replace / remove pacemaker  07/04/11    by Dr Doreatha Lew    Prior to Admission medications   Medication Sig Start Date End Date Taking? Authorizing Provider  acetaminophen (TYLENOL) 325 MG tablet Take 2 tablets (650 mg total) by mouth every 4 (four) hours as needed for mild pain, moderate pain, fever or headache. 01/26/16   Olesya Wike Klein, MD  ALPRAZolam Duanne Moron) 0.5 MG tablet TAKE 1/2-1 TABLET BY MOUTH 3 TIMES A DAY AS NEEDED FOR ANXIETY 01/01/16   Courtney Forcucci, PA-C  atenolol (TENORMIN) 100 MG tablet Take 1 tablet (100 mg total) by mouth 2 (two) times daily. 05/19/15   Darlin Coco, MD  B Complex-C (B-COMPLEX WITH VITAMIN C) tablet Take 1 tablet by mouth daily. 12/11/15   [name], MD  Biotin 5000 MCG CAPS Take 10,000 mcg by mouth every morning.     Historical Provider, MD    cetirizine (ZYRTEC) 10 MG tablet Take 10 mg by mouth at bedtime.    Historical Provider, MD  Cholecalciferol (VITAMIN D PO) Take 5,000 Units by mouth every morning.     Historical Provider, MD  Cyanocobalamin 1000 MCG/ML KIT Inject 1,000 mcg into the skin as directed. Patient taking differently: Inject 1,000 mcg into the skin every 30 (thirty) days.  11/24/15   Brunetta Genera, MD  diphenoxylate-atropine (LOMOTIL) 2.5-0.025 MG tablet Take 1 tablet by mouth 4 (four) times daily. 01/26/16   Yandel Zeiner Klein, MD  furosemide (LASIX) 40 MG tablet TAKE 1 TABLET (40 MG TOTAL) BY MOUTH DAILY. Patient taking differently: TAKE 1/2 TABLET (20 MG TOTAL) BY MOUTH DAILY. 12/11/15   [name], MD  guaiFENesin (MUCINEX) 600 MG 12 hr tablet Take 1,200 mg by mouth 2 (two) times daily as needed for cough or to loosen phlegm.     Historical Provider, MD  levothyroxine (SYNTHROID, LEVOTHROID) 100 MCG tablet TAKE 1 TABLET BY MOUTH EVERY DAY ON EMPTY STOMACH WITH A FULL GLASS OF WATER Patient taking differently: TAKE 1/2 TABLET (50 MCG TOTAL) BY MOUTH EVERY DAY ON EMPTY STOMACH WITH A FULL GLASS OF WATER 10/25/15   [name], MD  losartan (COZAAR) 50 MG tablet Take 1 tablet (50 mg total) by mouth daily. 11/26/15   Thompson Grayer, MD  LYRICA 75 MG capsule TAKE ONE CAPSULE BY MOUTH TWICE A DAY 11/29/15   [name], MD  methocarbamol (ROBAXIN) 500 MG tablet Take 1 tablet (500 mg total) by mouth every 8 (eight) hours as needed for muscle spasms. 01/26/16   Kalynn Declercq Klein, MD  nitroGLYCERIN (NITROSTAT) 0.4 MG SL tablet Place 1 tablet (0.4 mg total) under the tongue every 5 (five) minutes as needed for chest pain (MAX 3 TABLETS). 05/19/15   Darlin Coco, MD  ondansetron (ZOFRAN-ODT) 4 MG disintegrating tablet Take 1 tablet (4 mg total) by mouth every 6 (six) hours  as needed for nausea. 01/26/16   Faera Byerly, MD  pantoprazole (PROTONIX) 40 MG tablet TAKE 1 TABLET (40 MG TOTAL) BY MOUTH 2 (TWO) TIMES DAILY. 12/11/15    William McKeown, MD  potassium chloride SA (K-DUR,KLOR-CON) 20 MEQ tablet Take 20 mEq by mouth 2 (two) times daily.    Historical Provider, MD  sertraline (ZOLOFT) 50 MG tablet Take 1 tablet (50 mg total) by mouth daily. For mood 09/23/15   William McKeown, MD  sucralfate (CARAFATE) 1 GM/10ML suspension Take 10 mLs (1 g total) by mouth 4 (four) times daily -  with meals and at bedtime. 01/26/16   Faera Byerly, MD  traMADol (ULTRAM) 50 MG tablet Take 1 tablet (50 mg total) by mouth every 6 (six) hours as needed (pain). Reported on 10/14/2015 01/26/16   Faera Byerly, MD  triamcinolone cream (KENALOG) 0.1 % APPLY TO AFFECTED AREA TWICE A DAY 04/09/15   William McKeown, MD  warfarin (COUMADIN) 2 MG tablet TAKE 1 TABLET DAILY OR AS DIRECTED 01/26/16   William McKeown, MD    Scheduled Meds: . famotidine (PEPCID) IV  20 mg Intravenous Q24H  . fentaNYL (SUBLIMAZE) injection  25 mcg Intravenous Once  . metoCLOPramide (REGLAN) injection  10 mg Intravenous Q6H  . metoprolol  5 mg Intravenous Q6H  . pantoprazole (PROTONIX) IV  40 mg Intravenous Q12H  . sodium chloride flush  3 mL Intravenous Q12H   Infusions: . 0.45 % NaCl with KCl 20 mEq / L 100 mL/hr at 02/03/16 2352   PRN Meds: diphenhydrAMINE, LORazepam, ondansetron (ZOFRAN) IV, prochlorperazine   Allergies as of 02/03/2016 - Review Complete 02/03/2016  Allergen Reaction Noted  . Amiodarone  10/09/2015  . Codeine Nausea And Vomiting   . Gabapentin Itching 10/28/2014  . Hydrocodone Other (See Comments) 03/07/2014  . Morphine and related Nausea And Vomiting 12/13/2013  . Requip [ropinirole hcl]  07/17/2013  . Zinc  04/22/2011    Family History  Problem Relation Age of Onset  . Colon cancer Mother 50  . Heart disease Mother   . Heart disease Father   . Heart attack Father   . Heart disease Maternal Grandmother     Social History   Social History  . Marital Status: Widowed    Spouse Name: N/A  . Number of Children: N/A  . Years of  Education: N/A   Occupational History  . Not on file.   Social History Main Topics  . Smoking status: Never Smoker   . Smokeless tobacco: Never Used  . Alcohol Use: No  . Drug Use: No  . Sexual Activity: No   Other Topics Concern  . Not on file   Social History Narrative   Lives in [place].  Continues to work.    REVIEW OF SYSTEMS: Constitutional:  Weakness, weight loss ENT:  No nose bleeds Pulm:  No dyspnea.  Cough per HPI CV:  No palpitations, no LE edema.  GU:  Oliguria, no hematuria, no frequency GI:  Per HPI Heme:  No obvious bleeding or excessive bruising   Transfusions:  None, she is JW.  Neuro:  No headaches, no peripheral tingling or numbness Derm:  No itching, no rash or sores.  Endocrine:  No sweats or chills.  No polyuria or dysuria Immunization:  Reviewed.  S/p multiple vaccinations.  Travel:  None beyond local counties in last few months.    PHYSICAL EXAM: Vital signs in last 24 hours: Filed Vitals:   02/03/16 2136 02/04/16 0521  BP:   146/89 147/86  Pulse: 104 114  Temp: 98.4 F (36.9 C) 97.6 F (36.4 C)  Resp:     Wt Readings from Last 3 Encounters:  02/03/16 60.8 kg (134 lb 0.6 oz)  01/17/16 73.846 kg (162 lb 12.8 oz)  01/07/16 65.318 kg (144 lb)    General: pleasant, weak and somewhat frail elderly WF.  sleepiing but easy to awaken Head:  No swelling or asymmetry  Eyes:  No icterus or pallor Ears:  HOH  Nose:  No congestion or discharge Mouth:  Barium coating on her tongue and on lips Neck:  No mass, no TMG Lungs:  Clear bil.  No dyspnea or cough Heart: tachy, irreg/irreg. Abdomen:  Soft, NT, ND.  Healing scar is benign..   Rectal: deferred   Musc/Skeltl: no joint swelling or gross deformity.  Slight kyphosis.  Extremities:  No CCE.  Feet are warm.    Neurologic:  No tremor, no limb weakness.  No gross deficits.  Alert, oriented x 3.   Skin:  No telangectasia or suspicious sores Psych:  Pleasant, cooperative, not anxious or  agitated.   Intake/Output from previous day: 05/30 0701 - 05/31 0700 In: 745 [I.V.:595; IV Piggyback:150] Out: 200 [Urine:200] Intake/Output this shift: Total I/O In: -  Out: 250 [Urine:250]  LAB RESULTS:  Recent Labs  02/03/16 1655 02/04/16 0454  WBC 8.3 8.4  HGB 13.4 12.6  HCT 42.6 40.6  PLT 261 252   BMET Lab Results  Component Value Date   NA 148* 02/04/2016   NA 147* 02/03/2016   NA 142 01/26/2016   K 3.4* 02/04/2016   K 3.4* 02/03/2016   K 3.5 01/26/2016   CL 117* 02/04/2016   CL 115* 02/03/2016   CL 108 01/26/2016   CO2 22 02/04/2016   CO2 21* 02/03/2016   CO2 26 01/26/2016   GLUCOSE 133* 02/04/2016   GLUCOSE 126* 02/03/2016   GLUCOSE 91 01/26/2016   BUN 17 02/04/2016   BUN 18 02/03/2016   BUN 8 01/26/2016   CREATININE 1.00 02/04/2016   CREATININE 1.06* 02/03/2016   CREATININE 1.01* 01/26/2016   CALCIUM 9.4 02/04/2016   CALCIUM 9.5 02/03/2016   CALCIUM 8.7* 01/26/2016   LFT  Recent Labs  02/03/16 1655 02/04/16 0454  PROT 6.8 6.5  ALBUMIN 3.5 3.3*  AST 19 20  ALT 12* 13*  ALKPHOS 90 83  BILITOT 0.7 0.7   PT/INR Lab Results  Component Value Date   INR 3.22* 02/04/2016   INR 2.84* 02/03/2016   INR 2.28* 01/26/2016   Hepatitis Panel No results for input(s): HEPBSAG, HCVAB, HEPAIGM, HEPBIGM in the last 72 hours. C-Diff No components found for: CDIFF Lipase     Component Value Date/Time   LIPASE 43 02/03/2016 1655    Drugs of Abuse  No results found for: LABOPIA, COCAINSCRNUR, LABBENZ, AMPHETMU, THCU, LABBARB   RADIOLOGY STUDIES: Dg Esophagus  02/04/2016  CLINICAL DATA:  Vomiting for 2 days 3 weeks status post partial gastrectomy. Evaluate for food impaction. EXAM: ESOPHOGRAM/BARIUM SWALLOW TECHNIQUE: Single contrast examination was performed using  thin barium. FLUOROSCOPY TIME:  Radiation Exposure Index (as provided by the fluoroscopic device): 238.9 uGy*m2 If the device does not provide the exposure index: Fluoroscopy Time:  54  seconds Number of Acquired Images:  Fluoro only. COMPARISON:  Postoperative upper GI series 01/23/2016. FINDINGS: Study was performed in the LPO and right lateral decubitus positions. The patient swallowed the barium without difficulty. There is mild presbyesophagus. No evidence of food impaction, mucosal  ulceration or stricture. Contrast empties into the gastric pouch and proximal small bowel. No evidence of contrast leak. No laryngeal penetration observed. IMPRESSION: No evidence of food impaction, obstruction or extravasation. Presbyesophagus. Electronically Signed   By: William  Veazey M.D.   On: 02/04/2016 08:50   Dg Abd Acute W/chest  02/03/2016  CLINICAL DATA:  Vomiting after stomach surgery 01/15/2016. Partial gastrectomy for cancer. EXAM: DG ABDOMEN ACUTE W/ 1V CHEST COMPARISON:  11/12/2015 abdominal CT FINDINGS: Sutures in the left upper quadrant correlating with partial gastrectomy. Study is limited by gasless abdomen. No dilated loops are seen. No evidence of pneumoperitoneum. Trace left effusion. No pneumonia or edema. No cardiomegaly. Dual-chamber pacer from the right with leads in unremarkable position. IMPRESSION: 1. Gasless abdomen, a nonspecific finding that limits sensitivity. No indication of bowel obstruction or perforation. 2. Trace left effusion. Electronically Signed   By: Jonathon  Watts M.D.   On: 02/03/2016 18:21    ENDOSCOPIC STUDIES: Per HPI.    IMPRESSION:   *  Gastric adenocarcinoma.  S/p 01/15/16 subtotal gastrectomy.  Delayed gastric emptying/gastroparesis on 2008 emptying study.    Persistent dysphagia, regurgitation since surgery, now with 2 days of n/v.  Xray and contrast studies reveal only mild presbyesophagus.  Note TNTC WBCs, + nitrites on U/A.  Not yet on abx. Treated with Amoxil for lower growth enterococcal UTI in 11/2015.   *  PAF, hx retinal artery occlusion.  On chronic Coumadin, none since admission.  INR 3.2.    *  Anemia.  Started on parenteral iron,  Aranesp and B12 injections during 11/2015 admission   PLAN:     *  EGD?  Timing to be determined.    *  Needs urine clx. Will order.      Sarah Gribbin  02/04/2016, 10:07 AM Pager: [phone]      Attending physician's note   I have taken a history, examined the patient and reviewed the chart. I agree with the Advanced Practitioner's note, impression and recommendations. T2, N0 gastric adenocarcinoma s/p subtotal gastrectomy on 5/11 in a patient with a history of gastroparesis. She has had dysphagia, regurgitation since surgery and now nausea, vomiting for 2 days. Barium esophagram today showed presbyesophagus and contrast passing into the proximal small bowel. R/O exacerbation of gastroparesis, partial GOO, esophagitis, ulcer, etc. UTI needs treatment which could have exacerbated her GI complaints. IV PPI bid for now. Agree with holding Coumadin and Vit K as given. EGD when INR < 2.0. Mgmt discussed with Dr. Byerly.  Manna Gose, MD FACG [phone] Mon-Fri 8a-5p [phone] after 5p, weekends, holidays 

## 2016-02-05 ENCOUNTER — Observation Stay (HOSPITAL_COMMUNITY): Payer: Medicare Other | Admitting: Anesthesiology

## 2016-02-05 ENCOUNTER — Encounter (HOSPITAL_COMMUNITY): Payer: Self-pay | Admitting: *Deleted

## 2016-02-05 ENCOUNTER — Encounter: Payer: Self-pay | Admitting: Genetic Counselor

## 2016-02-05 ENCOUNTER — Other Ambulatory Visit: Payer: Self-pay

## 2016-02-05 ENCOUNTER — Encounter (HOSPITAL_COMMUNITY)
Admission: EM | Disposition: A | Discharge status: Home Health | Payer: Self-pay | Source: Home / Self Care | Attending: Internal Medicine

## 2016-02-05 DIAGNOSIS — K209 Esophagitis, unspecified: Secondary | ICD-10-CM

## 2016-02-05 DIAGNOSIS — I482 Chronic atrial fibrillation: Secondary | ICD-10-CM

## 2016-02-05 HISTORY — PX: ESOPHAGOGASTRODUODENOSCOPY: SHX5428

## 2016-02-05 LAB — COMPREHENSIVE METABOLIC PANEL
ALBUMIN: 3.1 g/dL — AB (ref 3.5–5.0)
ALT: 13 U/L — ABNORMAL LOW (ref 14–54)
ANION GAP: 10 (ref 5–15)
AST: 24 U/L (ref 15–41)
Alkaline Phosphatase: 80 U/L (ref 38–126)
BILIRUBIN TOTAL: 0.6 mg/dL (ref 0.3–1.2)
BUN: 15 mg/dL (ref 6–20)
CALCIUM: 9.1 mg/dL (ref 8.9–10.3)
CO2: 18 mmol/L — ABNORMAL LOW (ref 22–32)
Chloride: 118 mmol/L — ABNORMAL HIGH (ref 101–111)
Creatinine, Ser: 0.98 mg/dL (ref 0.44–1.00)
GFR calc non Af Amer: 53 mL/min — ABNORMAL LOW (ref >60)
GLUCOSE: 103 mg/dL — AB (ref 65–99)
POTASSIUM: 3.5 mmol/L (ref 3.5–5.1)
Sodium: 146 mmol/L — ABNORMAL HIGH (ref 135–145)
TOTAL PROTEIN: 5.8 g/dL — AB (ref 6.5–8.1)

## 2016-02-05 LAB — CBC WITH DIFFERENTIAL/PLATELET
Basophils Absolute: 0.1 10*3/uL (ref 0.0–0.1)
Basophils Relative: 1 %
Eosinophils Absolute: 0.4 10*3/uL (ref 0.0–0.7)
Eosinophils Relative: 5 %
HCT: 39.2 % (ref 36.0–46.0)
HEMOGLOBIN: 12.1 g/dL (ref 12.0–15.0)
LYMPHS ABS: 1.9 10*3/uL (ref 0.7–4.0)
LYMPHS PCT: 23 %
MCH: 28.6 pg (ref 26.0–34.0)
MCHC: 30.9 g/dL (ref 30.0–36.0)
MCV: 92.7 fL (ref 78.0–100.0)
Monocytes Absolute: 0.5 10*3/uL (ref 0.1–1.0)
Monocytes Relative: 6 %
NEUTROS ABS: 5.5 10*3/uL (ref 1.7–7.7)
Neutrophils Relative %: 65 %
Platelets: 212 10*3/uL (ref 150–400)
RBC: 4.23 MIL/uL (ref 3.87–5.11)
RDW: 17.8 % — ABNORMAL HIGH (ref 11.5–15.5)
WBC: 8.4 10*3/uL (ref 4.0–10.5)

## 2016-02-05 LAB — PROTIME-INR
INR: 1.91 — ABNORMAL HIGH (ref 0.00–1.49)
PROTHROMBIN TIME: 21.8 s — AB (ref 11.6–15.2)

## 2016-02-05 SURGERY — EGD (ESOPHAGOGASTRODUODENOSCOPY)
Anesthesia: Monitor Anesthesia Care

## 2016-02-05 MED ORDER — BUTAMBEN-TETRACAINE-BENZOCAINE 2-2-14 % EX AERO
INHALATION_SPRAY | CUTANEOUS | Status: DC | PRN
  Administered 2016-02-05: 2 via TOPICAL

## 2016-02-05 MED ORDER — METOPROLOL TARTRATE 5 MG/5ML IV SOLN
INTRAVENOUS | Status: AC
  Filled 2016-02-05: qty 5

## 2016-02-05 MED ORDER — SODIUM CHLORIDE 0.9 % IV SOLN: INTRAVENOUS | Status: DC

## 2016-02-05 MED ORDER — METOCLOPRAMIDE HCL 5 MG/ML IJ SOLN: 5.0000 mg | Freq: Four times a day (QID) | INTRAMUSCULAR | Status: DC | PRN

## 2016-02-05 MED ORDER — METOCLOPRAMIDE HCL 5 MG PO TABS
5.0000 mg | ORAL_TABLET | Freq: Three times a day (TID) | ORAL | Status: DC
  Filled 2016-02-05: qty 1

## 2016-02-05 MED ORDER — DILTIAZEM HCL 25 MG/5ML IV SOLN
10.0000 mg | Freq: Once | INTRAVENOUS | Status: AC
  Administered 2016-02-05: 10 mg via INTRAVENOUS
  Filled 2016-02-05: qty 5

## 2016-02-05 MED ORDER — METOCLOPRAMIDE HCL 5 MG/ML IJ SOLN
5.0000 mg | Freq: Four times a day (QID) | INTRAMUSCULAR | Status: DC
  Administered 2016-02-05: 5 mg via INTRAVENOUS
  Filled 2016-02-05: qty 2

## 2016-02-05 MED ORDER — LABETALOL HCL 5 MG/ML IV SOLN
INTRAVENOUS | Status: DC | PRN
  Administered 2016-02-05 (×2): 5 mg via INTRAVENOUS

## 2016-02-05 MED ORDER — METOCLOPRAMIDE HCL 5 MG/ML IJ SOLN
5.0000 mg | Freq: Three times a day (TID) | INTRAMUSCULAR | Status: DC
  Administered 2016-02-05 – 2016-02-07 (×9): 5 mg via INTRAVENOUS
  Filled 2016-02-05 (×10): qty 2

## 2016-02-05 MED ORDER — PROPOFOL 500 MG/50ML IV EMUL
INTRAVENOUS | Status: DC | PRN
  Administered 2016-02-05: 75 ug/kg/min via INTRAVENOUS

## 2016-02-05 MED ORDER — DIGOXIN 0.25 MG/ML IJ SOLN
0.2500 mg | Freq: Once | INTRAMUSCULAR | Status: AC
  Administered 2016-02-05: 0.25 mg via INTRAVENOUS
  Filled 2016-02-05: qty 1

## 2016-02-05 MED ORDER — LACTATED RINGERS IV SOLN
INTRAVENOUS | Status: DC | PRN
  Administered 2016-02-05: 10:00:00 via INTRAVENOUS

## 2016-02-05 MED ORDER — ACETAMINOPHEN 650 MG RE SUPP
650.0000 mg | RECTAL | Status: DC | PRN
  Administered 2016-02-05 – 2016-02-12 (×8): 650 mg via RECTAL
  Filled 2016-02-05 (×8): qty 1

## 2016-02-05 MED ORDER — SODIUM CHLORIDE 0.9 % IV BOLUS (SEPSIS)
500.0000 mL | Freq: Once | INTRAVENOUS | Status: AC
  Administered 2016-02-05: 500 mL via INTRAVENOUS

## 2016-02-05 MED ORDER — HYDRALAZINE HCL 20 MG/ML IJ SOLN
20.0000 mg | INTRAMUSCULAR | Status: DC | PRN
  Administered 2016-02-05 – 2016-02-12 (×6): 20 mg via INTRAVENOUS
  Filled 2016-02-05 (×6): qty 1

## 2016-02-05 NOTE — Progress Notes (Signed)
Pt. Has had A-fibb rate at 120-140. MD notified. New medication ordered. I also called [facility] Registered Nurse just to make them aware of patient's situation for when they make rounds.[name]

## 2016-02-05 NOTE — Progress Notes (Signed)
PROGRESS NOTE                                                                                                                                                                                                             Patient Demographics:    [name], is a 82 y.o. female, DOB - 1934-02-25, [id number]  Admit date - 02/03/2016   Admitting Physician [name], MD  Outpatient Primary MD for the patient is [name], MD  LOS -   Outpatient Specialists: [name]  Chief Complaint  Patient presents with  . Emesis       Brief Narrative   82 year old female with paroxysmal A. fib, CAD with history of CABG, moderate BS, tachycardia-bradycardia syndrome status post pacemaker, GERD, peripheral neuropathy, hypertension, hypothyroidism, CVA with left eye blindness, recently diagnosed adenocarcinoma of the stomach status post gastrectomy done earlier this month by [name] presented to the ED with nausea with multiple episodes of vomiting for 1 day.. Patient reported dysphagia as well. Reported frequent loose stools without worsening symptoms. No melena or bright red blood per rectum. Denies fever or chills, headache, shortness of breath or chest pain. Denied dysuria. Abdominal x-ray negative for obstruction. GI and surgery consulted.   Subjective:   Patient remains nothing by mouth and denies further nausea or vomiting. Denies abdominal pain.   Assessment  & Plan :    Principal Problem:   Intractable nausea and vomiting No signs of bowel obstruction. Recent subtotal gastrectomy for gastric adenocarcinoma. Has history of gastroparesis. -Barium esophagogram done showed presbyesophagus with contrast passing into the proximal small bowel.  Underwent EGD showing mid-distal third esophagitis without bleeding. Patent surgical anastomosis. Prolonged dating cratered gastric ulcers at the gastric anastomosis  with no bleeding stigmata. (Largest measuring 5 mm) -GI recommends twice a day PPI, Reglan 3 times a day before meals and at bedtime. Recommend strict antireflux measures with HIV deviation at all times. Start clear liquid diet advance to bariatric diet. -Patient was nauseous and vomited once after returning from EGD. Switched Reglan to IV.  Active Problems: UTI Patient denies dysuria.  empiric Rocephin. Follow cultures.  Uncontrolled hypertension Blood pressure elevated.. Holding oral medications. Placed on scheduled Lopressor and when necessary IV hydralazine. Reports her blood pressure at home to be normal.  Adenocarcinoma of stomach [facility]) Status post subtotal gastrectomy  recently. [name] consulted.    Chronic atrial fibrillation (HCC) Coumadin on hold for EGD. Resume tomorrow.  Hypokalemia/hyponatremia   secondary to dehydration Replenishing with fluids    Hypothyroidism Synthroid at IV dose.  CAD with history of CABG IV metoprolol.    Code Status : Full code  Family Communication  : Daughters at bedside  Disposition Plan  : Home possibly in 1-2 days if symptoms improve.  Barriers For Discharge : Ongoing nausea and vomiting.  Consults  :   [facility]  Procedures  :  barium esophagogram EGD  DVT Prophylaxis  :  On Coumadin  Lab Results  Component Value Date   PLT 212 02/05/2016    Antibiotics  :    Anti-infectives    Start     Dose/Rate Route Frequency Ordered Stop   02/04/16 1700  cefTRIAXone (ROCEPHIN) 1 g in dextrose 5 % 50 mL IVPB     1 g 100 mL/hr over 30 Minutes Intravenous Every 24 hours 02/04/16 1553          Objective:   Filed Vitals:   02/05/16 1040 02/05/16 1050 02/05/16 1100 02/05/16 1330  BP: 182/88 187/115 200/111 186/100  Pulse: 117 120 135 124  Temp:    97.7 F (36.5 C)  TempSrc:    Oral  Resp: 23 25 22 22   Height:      Weight:      SpO2: 99% 96% 95% 98%    Wt Readings from Last 3 Encounters:  02/03/16 60.8 kg  (134 lb 0.6 oz)  01/17/16 73.846 kg (162 lb 12.8 oz)  01/07/16 65.318 kg (144 lb)     Intake/Output Summary (Last 24 hours) at 02/05/16 1527 Last data filed at 02/05/16 1037  Gross per 24 hour  Intake    150 ml  Output    475 ml  Net   -325 ml     Physical Exam  Gen: not in distress, Appears fatigued HEENT: no pallor, Dry mucosa, supple neck Chest: clear b/l, no added sounds CVS: S1 and S2 irregular regular, no murmurs rubs or gallop GI: soft, NT, ND, BS+ Musculoskeletal: warm, no edema     Data Review:    CBC  Recent Labs Lab 02/03/16 1655 02/04/16 0454 02/05/16 0245  WBC 8.3 8.4 8.4  HGB 13.4 12.6 12.1  HCT 42.6 40.6 39.2  PLT 261 252 212  MCV 92.4 92.7 92.7  MCH 29.1 28.8 28.6  MCHC 31.5 31.0 30.9  RDW 17.8* 17.7* 17.8*  LYMPHSABS  --  1.3 1.9  MONOABS  --  0.7 0.5  EOSABS  --  0.2 0.4  BASOSABS  --  0.1 0.1    Chemistries   Recent Labs Lab 02/03/16 1655 02/04/16 0454 02/05/16 0245  NA 147* 148* 146*  K 3.4* 3.4* 3.5  CL 115* 117* 118*  CO2 21* 22 18*  GLUCOSE 126* 133* 103*  BUN 18 17 15   CREATININE 1.06* 1.00 0.98  CALCIUM 9.5 9.4 9.1  MG 1.7  --   --   AST 19 20 24   ALT 12* 13* 13*  ALKPHOS 90 83 80  BILITOT 0.7 0.7 0.6   ------------------------------------------------------------------------------------------------------------------ No results for input(s): CHOL, HDL, LDLCALC, TRIG, CHOLHDL, LDLDIRECT in the last 72 hours.  Lab Results  Component Value Date   HGBA1C 5.7* 10/09/2015   ------------------------------------------------------------------------------------------------------------------ No results for input(s): TSH, T4TOTAL, T3FREE, THYROIDAB in the last 72 hours.  Invalid input(s): FREET3 ------------------------------------------------------------------------------------------------------------------ No results for input(s): VITAMINB12, FOLATE,  FERRITIN, TIBC, IRON, RETICCTPCT in the last 72 hours.  Coagulation  profile  Recent Labs Lab 02/03/16 1655 02/04/16 0454 02/05/16 0245  INR 2.84* 3.22* 1.91*    No results for input(s): DDIMER in the last 72 hours.  Cardiac Enzymes No results for input(s): CKMB, TROPONINI, MYOGLOBIN in the last 168 hours.  Invalid input(s): CK ------------------------------------------------------------------------------------------------------------------ No results found for: BNP  Inpatient Medications  Scheduled Meds: . cefTRIAXone (ROCEPHIN)  IV  1 g Intravenous Q24H  . famotidine (PEPCID) IV  20 mg Intravenous Q24H  . fentaNYL (SUBLIMAZE) injection  25 mcg Intravenous Once  . levothyroxine  25 mcg Intravenous QAC breakfast  . metoCLOPramide (REGLAN) injection  5 mg Intravenous TID AC & HS  . metoprolol  5 mg Intravenous Q6H  . pantoprazole (PROTONIX) IV  40 mg Intravenous Q12H  . sodium chloride flush  3 mL Intravenous Q12H   Continuous Infusions: . 0.45 % NaCl with KCl 20 mEq / L 100 mL/hr at 02/05/16 1258   PRN Meds:.diphenhydrAMINE, hydrALAZINE, LORazepam, prochlorperazine  Micro Results No results found for this or any previous visit (from the past 240 hour(s)).  Radiology Reports Dg Esophagus  02/04/2016  CLINICAL DATA:  Vomiting for 2 days 3 weeks status post partial gastrectomy. Evaluate for food impaction. EXAM: ESOPHOGRAM/BARIUM SWALLOW TECHNIQUE: Single contrast examination was performed using  thin barium. FLUOROSCOPY TIME:  Radiation Exposure Index (as provided by the fluoroscopic device): 238.9 uGy*m2 If the device does not provide the exposure index: Fluoroscopy Time:  54 seconds Number of Acquired Images:  Fluoro only. COMPARISON:  Postoperative upper GI series 01/23/2016. FINDINGS: Study was performed in the LPO and right lateral decubitus positions. The patient swallowed the barium without difficulty. There is mild presbyesophagus. No evidence of food impaction, mucosal ulceration or stricture. Contrast empties into the gastric pouch and  proximal small bowel. No evidence of contrast leak. No laryngeal penetration observed. IMPRESSION: No evidence of food impaction, obstruction or extravasation. Presbyesophagus. Electronically Signed   By: Richardean Sale M.D.   On: 02/04/2016 08:50   Dg Abd Acute W/chest  02/03/2016  CLINICAL DATA:  Vomiting after stomach surgery 01/15/2016. Partial gastrectomy for cancer. EXAM: DG ABDOMEN ACUTE W/ 1V CHEST COMPARISON:  11/12/2015 abdominal CT FINDINGS: Sutures in the left upper quadrant correlating with partial gastrectomy. Study is limited by gasless abdomen. No dilated loops are seen. No evidence of pneumoperitoneum. Trace left effusion. No pneumonia or edema. No cardiomegaly. Dual-chamber pacer from the right with leads in unremarkable position. IMPRESSION: 1. Gasless abdomen, a nonspecific finding that limits sensitivity. No indication of bowel obstruction or perforation. 2. Trace left effusion. Electronically Signed   By: Monte Fantasia M.D.   On: 02/03/2016 18:21   Dg Duanne Limerick W/water Sol Cm  01/23/2016  CLINICAL DATA:  Status post partial gastrectomy.  Evaluate for leak. EXAM: WATER SOLUBLE UPPER GI SERIES TECHNIQUE: Single-column upper GI series was performed using water soluble contrast. CONTRAST:  1 ISOVUE-300 IOPAMIDOL (ISOVUE-300) INJECTION 61% COMPARISON:  01/18/2016 FLUOROSCOPY TIME:  Radiation Exposure Index (as provided by the fluoroscopic device): Not available If the device does not provide the exposure index: Fluoroscopy Time (in minutes and seconds):  1 minutes 18 seconds Number of Acquired Images:  1, the rest saved fluoroscopy FINDINGS: Scout images over the chest epigastrium were obtained. Patient had to take swallows LPO. In this position there was no emptying of the remnant stomach with frequent large volume gastroesophageal reflux. Edema and obstruction at the gastro enterostomy with expected, but when  supine and RPO the stomach drained readily. Negative for leak. The duodenal C-loop  opacified and is unremarkable. IMPRESSION: 1. Negative for postoperative leak or obstruction. 2. Positional drainage of the remnant stomach. When LPO there was no drainage and large volume, symptomatic gastroesophageal reflux. 3. Distal esophagitis related to #2 or recent NG tube. Electronically Signed   By: Monte Fantasia M.D.   On: 01/23/2016 09:49   Dg Duanne Limerick W/water Sol Cm  01/18/2016  CLINICAL DATA:  82 year old female status post partial gastrectomy. Evaluate for potential postoperative leak or obstruction. EXAM: WATER SOLUBLE UPPER GI SERIES TECHNIQUE: Single-column upper GI series was performed using water soluble contrast. CONTRAST:  60 mL of Gastrografin COMPARISON:  No priors. FLUOROSCOPY TIME:  If the device does not provide the exposure index: Fluoroscopy Time (in minutes and seconds):  1 minutes and 18 seconds Number of Acquired Images:  5 series with multiple images. FINDINGS: Initial image demonstrated a nasogastric a tube in place with tip in the gastric remnant and side-port in the distal esophagus. Midline skin staples are noted. Pacemaker leads extending over the expected location of the right atrial appendage and right ventricular apex. Gastrografin was infused via the patient's indwelling NG tube, which demonstrated a small gastric remnant. There was gastroesophageal reflux into the esophagus throughout the examination. Contrast readily traversed the pylorus into the proximal small bowel. There was no extravasation of contrast material at any point during the examination. IMPRESSION: 1. Expected postoperative appearance of the stomach following partial gastrectomy, without evidence of postoperative leak or obstruction. These results will be called to the ordering clinician or representative by the Radiologist Assistant, and communication documented in the PACS or zVision Dashboard. Electronically Signed   By: [name] M.D.   On: 01/18/2016 15:59    Time Spent in minutes   25   Louellen Molder M.D on 02/05/2016 at 3:27 PM  Between 7am to 7pm - Pager - [phone]  After 7pm go to [email or web address] - password [facility]  [facility] -  Office  [phone]

## 2016-02-05 NOTE — Progress Notes (Signed)
Lauren, RN updated that PICC will be place 6-2 due to TNA not starting until 6-2.  Carolee Rota, RN VAST

## 2016-02-05 NOTE — Progress Notes (Signed)
TPN per pharmacy consult received. As per protocol, TPN cannot be compounded for administration tonight. TPN lab panel ordered for 6/2 and TPN pharmacist will follow up tomorrow.  Nena Jordan, PharmD, BCPS 02/05/2016, 3:49 PM

## 2016-02-05 NOTE — Progress Notes (Signed)
Day of Surgery  Subjective: Pt just got EGD.  Severe esophagitis.  Several tiny ulcers at anastamosis.    Objective: Vital signs in last 24 hours: Temp:  [97.7 F (36.5 C)-98.8 F (37.1 C)] 97.7 F (36.5 C) (06/01 1330) Pulse Rate:  [113-135] 124 (06/01 1330) Resp:  [17-25] 22 (06/01 1330) BP: (152-232)/(87-115) 186/100 mmHg (06/01 1330) SpO2:  [95 %-99 %] 98 % (06/01 1330) Last BM Date: 02/04/16  Intake/Output from previous day: 05/31 0701 - 06/01 0700 In: 925 [I.V.:925] Out: 725 [Urine:725] Intake/Output this shift: Total I/O In: 150 [I.V.:150] Out: 0   General appearance: still sedated Resp: breathing comfortably GI: soft, non distended Extremities: extremities normal, atraumatic, no cyanosis or edema  Lab Results:   Recent Labs  02/04/16 0454 02/05/16 0245  WBC 8.4 8.4  HGB 12.6 12.1  HCT 40.6 39.2  PLT 252 212   BMET  Recent Labs  02/04/16 0454 02/05/16 0245  NA 148* 146*  K 3.4* 3.5  CL 117* 118*  CO2 22 18*  GLUCOSE 133* 103*  BUN 17 15  CREATININE 1.00 0.98  CALCIUM 9.4 9.1   PT/INR  Recent Labs  02/04/16 0454 02/05/16 0245  LABPROT 32.3* 21.8*  INR 3.22* 1.91*   ABG No results for input(s): PHART, HCO3 in the last 72 hours.  Invalid input(s): PCO2, PO2  Studies/Results: Dg Esophagus  02/04/2016  CLINICAL DATA:  Vomiting for 2 days 3 weeks status post partial gastrectomy. Evaluate for food impaction. EXAM: ESOPHOGRAM/BARIUM SWALLOW TECHNIQUE: Single contrast examination was performed using  thin barium. FLUOROSCOPY TIME:  Radiation Exposure Index (as provided by the fluoroscopic device): 238.9 uGy*m2 If the device does not provide the exposure index: Fluoroscopy Time:  54 seconds Number of Acquired Images:  Fluoro only. COMPARISON:  Postoperative upper GI series 01/23/2016. FINDINGS: Study was performed in the LPO and right lateral decubitus positions. The patient swallowed the barium without difficulty. There is mild presbyesophagus. No  evidence of food impaction, mucosal ulceration or stricture. Contrast empties into the gastric pouch and proximal small bowel. No evidence of contrast leak. No laryngeal penetration observed. IMPRESSION: No evidence of food impaction, obstruction or extravasation. Presbyesophagus. Electronically Signed   By: Richardean Sale M.D.   On: 02/04/2016 08:50   Dg Abd Acute W/chest  02/03/2016  CLINICAL DATA:  Vomiting after stomach surgery 01/15/2016. Partial gastrectomy for cancer. EXAM: DG ABDOMEN ACUTE W/ 1V CHEST COMPARISON:  11/12/2015 abdominal CT FINDINGS: Sutures in the left upper quadrant correlating with partial gastrectomy. Study is limited by gasless abdomen. No dilated loops are seen. No evidence of pneumoperitoneum. Trace left effusion. No pneumonia or edema. No cardiomegaly. Dual-chamber pacer from the right with leads in unremarkable position. IMPRESSION: 1. Gasless abdomen, a nonspecific finding that limits sensitivity. No indication of bowel obstruction or perforation. 2. Trace left effusion. Electronically Signed   By: Monte Fantasia M.D.   On: 02/03/2016 18:21    Anti-infectives: Anti-infectives    Start     Dose/Rate Route Frequency Ordered Stop   02/04/16 1700  cefTRIAXone (ROCEPHIN) 1 g in dextrose 5 % 50 mL IVPB     1 g 100 mL/hr over 30 Minutes Intravenous Every 24 hours 02/04/16 1553        Assessment/Plan: s/p Procedure(s): ESOPHAGOGASTRODUODENOSCOPY (EGD) (N/A) BID protonix  Reglan added TNA for protein calorie malnutrition.   Will do bariatric clears at this point.       [facility] 02/05/2016

## 2016-02-05 NOTE — Anesthesia Postprocedure Evaluation (Signed)
Anesthesia Post Note  Patient: [name]  Procedure(s) Performed: Procedure(s) (LRB): ESOPHAGOGASTRODUODENOSCOPY (EGD) (N/A)  Patient location during evaluation: Endoscopy Anesthesia Type: MAC Level of consciousness: awake and alert and oriented Pain management: satisfactory to patient Vital Signs Assessment: post-procedure vital signs reviewed and stable Respiratory status: spontaneous breathing Cardiovascular status: blood pressure returned to baseline (labetalol 10mg  IV given intra-op) Anesthetic complications: no    Last Vitals:  Filed Vitals:   02/05/16 0939 02/05/16 1037  BP: 232/94 163/93  Pulse: 121 113  Temp: 36.9 C   Resp: 17 22    Last Pain:  Filed Vitals:   02/05/16 1038  PainSc: 0-No pain                 Clearnce Sorrel

## 2016-02-05 NOTE — Op Note (Signed)
[facility] Patient Name: [name] Procedure Date : 02/05/2016 MRN: [id number] Attending MD: Ladene Artist , MD Date of Birth: 1934/01/02 CSN: [id number] Age: 82 Admit Type: Inpatient Procedure:                Upper GI endoscopy Indications:              Persistent vomiting of unknown cause Providers:                Pricilla Riffle. Fuller Plan, MD, Carolynn Comment, RN,                            William Dalton, Technician Referring MD:             Stark Klein, MD Medicines:                Monitored Anesthesia Care Complications:            No immediate complications. Estimated Blood Loss:     Estimated blood loss: none. Procedure:                Pre-Anesthesia Assessment:                           - Prior to the procedure, a History and Physical                            was performed, and patient medications and                            allergies were reviewed. The patient's tolerance of                            previous anesthesia was also reviewed. The risks                            and benefits of the procedure and the sedation                            options and risks were discussed with the patient.                            All questions were answered, and informed consent                            was obtained. Prior Anticoagulants: The patient has                            taken Coumadin (warfarin), last dose was 2 days                            prior to procedure. ASA Grade Assessment: III - A                            patient with severe systemic disease. After  reviewing the risks and benefits, the patient was                            deemed in satisfactory condition to undergo the                            procedure.                           After obtaining informed consent, the endoscope was                            passed under direct vision. Throughout the                            procedure, the patient's  blood pressure, pulse, and                            oxygen saturations were monitored continuously. The                            [id number]) scope was introduced through the                            mouth, and advanced to the second part of duodenum.                            The upper GI endoscopy was accomplished without                            difficulty. The patient tolerated the procedure                            well. Scope In: Scope Out: Findings:      LA Grade D (one or more mucosal breaks involving at least 75% of       esophageal circumference) esophagitis with no bleeding was found in the       mid and distal thirds of the esophagus.      Evidence of a subtotal gastrectomy was found in the cardia. The       anastomosis was patent. [Description].      Two non-bleeding cratered gastric ulcers at the gastric anastomosis with       no stigmata of bleeding were found in the stomach. The largest was 5 mm       in largest dimension.      The gastric antrum, prepyloric region of the stomach and pylorus were       normal. The gastric remnant measured about 5 cm. Retroflexion not       performed due to the gastric anatomy.      The duodenal bulb and second portion of the duodenum were normal. Impression:               - LA Grade D reflux esophagitis.                           - A  subtotal gastrectomy was found                           - Non-bleeding gastric ulcers at the anastomosis                            with no stigmata of bleeding. Moderate Sedation:      none Recommendation:           - Return patient to hospital ward for ongoing care.                           - Clear liquid diet and advance to a bariatric type                            diet.                           - Use Protonix (pantoprazole) 40 mg PO BID.                           - Use metoclopramide 5 mg PO QID; 30 min AC and HS.                           - Strict antireflux measures with HOB  elevated to                            greater than 30 degrees at all times                           - GI follow up with Dr. Silvano Rusk                           - Continue present medications.                           - Resume Coumadin (warfarin) at prior dose                            tomorrow. Refer to managing physician for further                            adjustment of therapy. Procedure Code(s):        --- Professional ---                           [id number], Esophagogastroduodenoscopy, flexible,                            transoral; diagnostic, including collection of                            specimen(s) by brushing or washing, when performed                            (separate procedure) Diagnosis  Code(s):        --- Professional ---                           K21.0, Gastro-esophageal reflux disease with                            esophagitis                           K25.9, Gastric ulcer, unspecified as acute or                            chronic, without hemorrhage or perforation                           R11.10, Vomiting, unspecified CPT copyright 2016 [employer] Medical Association. All rights reserved. The codes documented in this report are preliminary and upon coder review may  be revised to meet current compliance requirements. Ladene Artist, MD 02/05/2016 10:40:48 AM This report has been signed electronically. Number of Addenda: 0

## 2016-02-05 NOTE — Transfer of Care (Signed)
Immediate Anesthesia Transfer of Care Note  Patient: [name]  Procedure(s) Performed: Procedure(s): ESOPHAGOGASTRODUODENOSCOPY (EGD) (N/A)  Patient Location: Endoscopy Unit  Anesthesia Type:MAC  Level of Consciousness: awake, alert  and oriented  Airway & Oxygen Therapy: Patient Spontanous Breathing and Patient connected to nasal cannula oxygen  Post-op Assessment: Report given to RN and Post -op Vital signs reviewed and stable  Post vital signs: Reviewed and stable  Last Vitals:  Filed Vitals:   02/05/16 0939 02/05/16 1037  BP: 232/94 163/93  Pulse: 121 113  Temp: 36.9 C   Resp: 17 22    Last Pain:  Filed Vitals:   02/05/16 1038  PainSc: 0-No pain         Complications: No apparent anesthesia complications

## 2016-02-05 NOTE — Progress Notes (Signed)
Patient's heart rate 120's - 140's. She received 10mg  IV Cardizem push at [place]. Heart rate initially down in 90's after Cardizem, but at this time it has increased to 120's-140's. Patient denies chest pain, shortness of breath. No signs of distress. Her main complaint is continued nausea and her face is very red and flushed. Patient's daughter notes that this is new for her; she was concerned that the patient might have a fever,but patient is afebrile at this time.  Paged on call MD to notify her of patient's increased heart rate.

## 2016-02-05 NOTE — Progress Notes (Signed)
Daily Rounding Note  02/05/2016, 8:29 AM    SUBJECTIVE:       Feeling a little better, not having n/v but still "spitting up".  No abd pain.  No chest pain.  No dyspnea.  Lost IV access, awaiting IV team to replace IV.     OBJECTIVE:         Vital signs in last 24 hours:    Temp:  [98.2 F (36.8 C)-99 F (37.2 C)] 98.8 F (37.1 C) (06/01 0525) Pulse Rate:  [100-132] 132 (06/01 0525) Resp:  [20] 20 (06/01 0525) BP: (150-167)/(65-104) 158/104 mmHg (06/01 0525) SpO2:  [97 %-98 %] 98 % (06/01 0525) Last BM Date: 02/04/16 Filed Weights   02/03/16 1634 02/03/16 2100  Weight: 59.875 kg (132 lb) 60.8 kg (134 lb 0.6 oz)   General: pleasant, more alert.  Looks better   Heart: irreg, irreg.  Rapid. Chest: clear bil.  No dyspnea or cough.  Abdomen: soft, NT, ND.    Extremities: no CCE.   Neuro/Psych:  Pleasant, calm.    Intake/Output from previous day: 05/31 0701 - 06/01 0700 In: 925 [I.V.:925] Out: 725 [Urine:725]  Intake/Output this shift:    Lab Results:  Recent Labs  02/03/16 1655 02/04/16 0454 02/05/16 0245  WBC 8.3 8.4 8.4  HGB 13.4 12.6 12.1  HCT 42.6 40.6 39.2  PLT 261 252 212   BMET  Recent Labs  02/03/16 1655 02/04/16 0454 02/05/16 0245  NA 147* 148* 146*  K 3.4* 3.4* 3.5  CL 115* 117* 118*  CO2 21* 22 18*  GLUCOSE 126* 133* 103*  BUN 18 17 15   CREATININE 1.06* 1.00 0.98  CALCIUM 9.5 9.4 9.1   LFT  Recent Labs  02/03/16 1655 02/04/16 0454 02/05/16 0245  PROT 6.8 6.5 5.8*  ALBUMIN 3.5 3.3* 3.1*  AST 19 20 24   ALT 12* 13* 13*  ALKPHOS 90 83 80  BILITOT 0.7 0.7 0.6   PT/INR  Recent Labs  02/04/16 0454 02/05/16 0245  LABPROT 32.3* 21.8*  INR 3.22* 1.91*    Studies/Results: Dg Esophagus  02/04/2016  CLINICAL DATA:  Vomiting for 2 days 3 weeks status post partial gastrectomy. Evaluate for food impaction. EXAM: ESOPHOGRAM/BARIUM SWALLOW TECHNIQUE: Single contrast examination  was performed using  thin barium. FLUOROSCOPY TIME:  Radiation Exposure Index (as provided by the fluoroscopic device): 238.9 uGy*m2 If the device does not provide the exposure index: Fluoroscopy Time:  54 seconds Number of Acquired Images:  Fluoro only. COMPARISON:  Postoperative upper GI series 01/23/2016. FINDINGS: Study was performed in the LPO and right lateral decubitus positions. The patient swallowed the barium without difficulty. There is mild presbyesophagus. No evidence of food impaction, mucosal ulceration or stricture. Contrast empties into the gastric pouch and proximal small bowel. No evidence of contrast leak. No laryngeal penetration observed. IMPRESSION: No evidence of food impaction, obstruction or extravasation. Presbyesophagus. Electronically Signed   By: Richardean Sale M.D.   On: 02/04/2016 08:50   Dg Abd Acute W/chest  02/03/2016  CLINICAL DATA:  Vomiting after stomach surgery 01/15/2016. Partial gastrectomy for cancer. EXAM: DG ABDOMEN ACUTE W/ 1V CHEST COMPARISON:  11/12/2015 abdominal CT FINDINGS: Sutures in the left upper quadrant correlating with partial gastrectomy. Study is limited by gasless abdomen. No dilated loops are seen. No evidence of pneumoperitoneum. Trace left effusion. No pneumonia or edema. No cardiomegaly. Dual-chamber pacer from the right with leads in unremarkable position. IMPRESSION: 1. Gasless abdomen, a nonspecific  finding that limits sensitivity. No indication of bowel obstruction or perforation. 2. Trace left effusion. Electronically Signed   By: Monte Fantasia M.D.   On: 02/03/2016 18:21   Scheduled Meds: . cefTRIAXone (ROCEPHIN)  IV  1 g Intravenous Q24H  . famotidine (PEPCID) IV  20 mg Intravenous Q24H  . fentaNYL (SUBLIMAZE) injection  25 mcg Intravenous Once  . levothyroxine  25 mcg Intravenous QAC breakfast  . metoCLOPramide (REGLAN) injection  10 mg Intravenous Q6H  . metoprolol  5 mg Intravenous Q6H  . pantoprazole (PROTONIX) IV  40 mg  Intravenous Q12H  . sodium chloride flush  3 mL Intravenous Q12H   Continuous Infusions: . 0.45 % NaCl with KCl 20 mEq / L 100 mL/hr (02/04/16 2129)   PRN Meds:.diphenhydrAMINE, hydrALAZINE, LORazepam, prochlorperazine   ASSESMENT:   * Gastric adenocarcinoma. S/p 01/15/16 subtotal gastrectomy. Delayed gastric emptying/gastroparesis on 2008 emptying study.  Persistent dysphagia, regurgitation since surgery, now with 2 days of n/v. Xray and contrast studies reveal only mild presbyesophagus.   *  TNTC WBCs, + nitrites on U/A. Day 2 Rocephin. Treated with Amoxil for lower growth enterococcal UTI in 11/2015.   *  AKI, improved.   * PAF, hx retinal artery occlusion. Chronic Coumadin on hold. INR 1.9 after 5/31 SQ Vitamin K.  Current rate is tachy.    * Anemia. Started on parenteral iron, Aranesp and B12 injections during 11/2015 admission    PLAN   *  EGD, this AM if IV can be replaced.      [name]  02/05/2016, 8:29 AM Pager: [phone]     Attending physician's note   I have taken an interval history, reviewed the chart and examined the patient. I agree with the Advanced Practitioner's note, impression and recommendations. PAF with HR elevated today. INR=1.9 today so will proceed with EGD.   Lucio Edward, MD [facility] [phone] Mon-Fri 8a-5p [phone] after 5p, weekends, holidays

## 2016-02-05 NOTE — Progress Notes (Signed)
Called to assist with care of patient with HR of 120-140 Afib. Patient noted to have a history of Afib. Chart notes were reviewed. Patient is alert and oriented. No signs of acute distress. Family is at bedside. Patient was given IV Cardizem for HR which had some effect. Patients HR continues in the 110-130's after the IV Cardizem. Unit RN was make on-call provider aware. No RRT interventions are needed at this time.

## 2016-02-05 NOTE — H&P (View-Only) (Signed)
Gastroenterology Consult: 10:07 AM 02/04/2016     Referring Provider: Dr Clementeen Graham  Primary Care Physician:  [name], MD Primary Gastroenterologist:  Dr. Carlean Purl, previously Dr Deatra Ina.    Reason for Consultation:  Nausea, vomiting.    HPI: [name] is a 82 y.o. female Jehov[employer]'s Witness.  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  S/p repair patent ductus arteriosis.  Aortic stenosis.  CHF, EF 60-65 % in 11/1015.  S/p CABG.  2015 left retinal artery occlusion with vision loss.   S/p multiple orthopedic surgeries and joint replacements.  PAF, on chronic Coumadin.  CKD stage 3.  HTN.  Hypothyroidism.  Strep [place] PNA 11/2015.  Hx renal cell cancer, s/p nephrectomy.  Hx uterine cancer, s/p hysterectomy and radiation.  S/p cholecystectomy.  Enterococcal UTI treated with Amoxicillin in 11/2015.   2008 gastric emptying study showed 63% retained gastric contents at 2 hours, normal being less than 30% at 2 hours.    Anemia, FOBT + with Hgb to 7.9 in 11/2015.  Received parenteral iron, started on Aranesp and B12 injections per oncology.  11/11/15 EGD.  Gastric tumor at anterior wall, oozing blood and stigmata of recent bleeding.  Path: atypical glandular tissue, suspicious for adenocarcinoma.   11/11/15 Colonoscopy.  Normal colon except for decreased rectal sphincter tone.  No mets on CT.  PET scan showed gastric lesion and hypermetabollic inguinal nodes.    01/15/16 Diagnostic laparoscopy, open subtotal gastrectomy.  Pathology: poorly differentiated adenocarcinoma. Dysphagia post removal of NGT post op requiring multiple swallows to pass foods.  Not seen by SLP .  Also anorexia, early satiety.  5/14 UGI series: Expected postoperative appearance of the stomach following partial gastrectomy, without evidence of  postoperative leak or obstruction. 5/19 UGI series: 1. Negative for postoperative leak or obstruction.  2. Positional drainage of the remnant stomach. When LPO there was no drainage and large volume, symptomatic gastroesophageal reflux.  3. Distal esophagitis related to #2 or recent NG tube.  Discharged after 5/11 - 01/26/16 admission on Coumadin, BID Protonix, QID Sucralfate, full liquid/soft diet, Ensure and protein supplements.  Protein cal malnutrition noted in discharge summary.  Pt has not been back to oncologist, Dr Irene Limbo, since the gastrectomy.  At home this month, seen as outpt by SLP, who noted pt was having to perform multiple swallows to try to get food to move.  Pt and dtrs observation is that she ends up regurgitating, "spitting up" frothy or yellow/brown sputum after attempts to swallow even water.  Even without po, pt is coughing and regurgitating sputum.  This is worse when she lays flat, so she has been sleeping in lounge chair.  Zofran and Phenergan added to PPI and Carafate but the problem persisted.  2 days ago she developed nausea and true vomiting, no CG or bloody material.  Wt loss of 10 # since her 01/2016 discharge.   Came to ED last night with weakness and the N/V.  Stools are soft 5 to 6 times per day but this has been her normal baseline since the radiation  for uterine cancer. .   5/30 abdominal xray: Gasless abdomen, a nonspecific finding that limits sensitivity.  No indication of bowel obstruction or perforation. 5/31 Esophagram: Mild Presbyesophagus, no obstruction, no leak, no food impaction.  q 6 hour Reglan IV started, 3 doses so far.    Past Medical History  Diagnosis Date  . PAF (paroxysmal atrial fibrillation) ([place])     controlled with amiodarone, on coumadin  . CAD (coronary artery disease)   . Aortic root dilatation ([place])   . Moderate aortic stenosis   . Chronic anticoagulation   . Tachycardia-bradycardia syndrome ([place])     s/p PPM by Dr Doreatha Lew ([facility])  07/03/10  . GERD (gastroesophageal reflux disease)   . Pacemaker     2012  . Neuropathy ([place])     feet/legs  . Restless leg syndrome   . Headache(784.0)     hx migraines - takes Zoloft for migraines  . History of uterine cancer 1980s    treated with hysterectomy, external radiation and radiation seed implants.   . Cancer ([place])     hx Kidney cancer / hx of endometrial cancer  . Anxiety   . Heart murmur   . HTN (hypertension)   . Hyperlipemia   . Hypothyroidism   . Osteomyelitis ([place]) as child  . Family history of anesthesia complication     daughter has ponv  . Syncope 05/28/2014  . Elevated LFTs     hepatic steatosis  . Gastroparesis   . Anal fissure   . Radiation proctitis   . Neuromuscular disorder ([facility])     neuropathy in right leg  . Adenocarcinoma of stomach ([place]) 11/11/2015    gastric mass on egd  . PONV (postoperative nausea and vomiting)     30 yrs ago since then no problem  . CHF (congestive heart failure) ([place])   . Pneumonia     hx  . Chronic kidney disease     only has one kidney rt lft rem 03 ca  . 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 Anemia     hx 3/17 iron infusion, on aranesp and injected B12 since 11/2015.   . Stroke ([place]) 17    partial stroke left legally blind  . Nausea 01/2016  . Transfusion of blood product refused for religious reason     Past Surgical History  Procedure Laterality Date  . Cardiac catheterization  2008  . Nephrectomy Left     renal cell cancer  . Patent ductus arterious repair  ~ 1949  . Ptvp    . Cholecystectomy  1970s  . Upper gastrointestinal endoscopy    . Total knee arthroplasty  02/07/2012    Procedure: TOTAL KNEE ARTHROPLASTY;  Surgeon: Mauri Pole, MD;  Location: [facility];  Service: Orthopedics;  Laterality: Right;  . Total hip revision Right 2009    initial replacment surg  . I&d knee with poly exchange Right 12/17/2013    Procedure: IRRIGATION AND DEBRIDEMEN RIGHT TOTAL  KNEE WITH POLY EXCHANGE;  Surgeon: Mauri Pole, MD;   Location: [facility];  Service: Orthopedics;  Laterality: Right;  . Patellar tendon repair Right 03/06/2014    Procedure: AVULSION WITH PATELLA TENDON REPAIR;  Surgeon: Mauri Pole, MD;  Location: [facility];  Service: Orthopedics;  Laterality: Right;  . Total knee revision Right 07/29/2014    Procedure: RIGHT KNEE REVISION OF PREVIOUS REPAIR EXTENSOR MECHANISM;  Surgeon: Mauri Pole, MD;  Location: [facility];  Service: Orthopedics;  Laterality: Right;  .  Colonoscopy N/A 11/11/2015    Procedure: COLONOSCOPY;  Surgeon: [name], MD;  Location: [facility];  Service: Endoscopy;  Laterality: N/A;  . Esophagogastroduodenoscopy N/A 11/11/2015    Procedure: ESOPHAGOGASTRODUODENOSCOPY (EGD);  Surgeon: [name], MD;  Location: [facility];  Service: Endoscopy;  Laterality: N/A;  . Total abdominal hysterectomy  85  . Laparoscopy N/A 01/15/2016    Procedure: LAPAROSCOPY DIAGNOSTIC;  Surgeon: Lilyian Quayle Klein, MD;  Location: [place] [facility];  Service: General;  Laterality: N/A;  . Gastrectomy N/A 01/15/2016    Procedure: OPEN PARTIAL GASTRECTOMY;  Surgeon: Yaretzi Ernandez Klein, MD;  Location: [facility];  Service: General;  Laterality: N/A;  . Insert / replace / remove pacemaker  07/04/11    by Dr Doreatha Lew    Prior to Admission medications   Medication Sig Start Date End Date Taking? Authorizing Provider  acetaminophen (TYLENOL) 325 MG tablet Take 2 tablets (650 mg total) by mouth every 4 (four) hours as needed for mild pain, moderate pain, fever or headache. 01/26/16   Jolanta Cabeza Klein, MD  ALPRAZolam Duanne Moron) 0.5 MG tablet TAKE 1/2-1 TABLET BY MOUTH 3 TIMES A DAY AS NEEDED FOR ANXIETY 01/01/16   Courtney Forcucci, PA-C  atenolol (TENORMIN) 100 MG tablet Take 1 tablet (100 mg total) by mouth 2 (two) times daily. 05/19/15   Darlin Coco, MD  B Complex-C (B-COMPLEX WITH VITAMIN C) tablet Take 1 tablet by mouth daily. 12/11/15   [name], MD  Biotin 5000 MCG CAPS Take 10,000 mcg by mouth every morning.     Historical Provider, MD    cetirizine (ZYRTEC) 10 MG tablet Take 10 mg by mouth at bedtime.    Historical Provider, MD  Cholecalciferol (VITAMIN D PO) Take 5,000 Units by mouth every morning.     Historical Provider, MD  Cyanocobalamin 1000 MCG/ML KIT Inject 1,000 mcg into the skin as directed. Patient taking differently: Inject 1,000 mcg into the skin every 30 (thirty) days.  11/24/15   Brunetta Genera, MD  diphenoxylate-atropine (LOMOTIL) 2.5-0.025 MG tablet Take 1 tablet by mouth 4 (four) times daily. 01/26/16   Elmo Rio Klein, MD  furosemide (LASIX) 40 MG tablet TAKE 1 TABLET (40 MG TOTAL) BY MOUTH DAILY. Patient taking differently: TAKE 1/2 TABLET (20 MG TOTAL) BY MOUTH DAILY. 12/11/15   [name], MD  guaiFENesin (MUCINEX) 600 MG 12 hr tablet Take 1,200 mg by mouth 2 (two) times daily as needed for cough or to loosen phlegm.     Historical Provider, MD  levothyroxine (SYNTHROID, LEVOTHROID) 100 MCG tablet TAKE 1 TABLET BY MOUTH EVERY DAY ON EMPTY STOMACH WITH A FULL GLASS OF WATER Patient taking differently: TAKE 1/2 TABLET (50 MCG TOTAL) BY MOUTH EVERY DAY ON EMPTY STOMACH WITH A FULL GLASS OF WATER 10/25/15   [name], MD  losartan (COZAAR) 50 MG tablet Take 1 tablet (50 mg total) by mouth daily. 11/26/15   Thompson Grayer, MD  LYRICA 75 MG capsule TAKE ONE CAPSULE BY MOUTH TWICE A DAY 11/29/15   [name], MD  methocarbamol (ROBAXIN) 500 MG tablet Take 1 tablet (500 mg total) by mouth every 8 (eight) hours as needed for muscle spasms. 01/26/16   Caralina Nop Klein, MD  nitroGLYCERIN (NITROSTAT) 0.4 MG SL tablet Place 1 tablet (0.4 mg total) under the tongue every 5 (five) minutes as needed for chest pain (MAX 3 TABLETS). 05/19/15   Darlin Coco, MD  ondansetron (ZOFRAN-ODT) 4 MG disintegrating tablet Take 1 tablet (4 mg total) by mouth every 6 (six) hours  as needed for nausea. 01/26/16   Stark Klein, MD  pantoprazole (PROTONIX) 40 MG tablet TAKE 1 TABLET (40 MG TOTAL) BY MOUTH 2 (TWO) TIMES DAILY. 12/11/15    [name], MD  potassium chloride SA (K-DUR,KLOR-CON) 20 MEQ tablet Take 20 mEq by mouth 2 (two) times daily.    Historical Provider, MD  sertraline (ZOLOFT) 50 MG tablet Take 1 tablet (50 mg total) by mouth daily. For mood 09/23/15   [name], MD  sucralfate (CARAFATE) 1 GM/10ML suspension Take 10 mLs (1 g total) by mouth 4 (four) times daily -  with meals and at bedtime. 01/26/16   Stark Klein, MD  traMADol (ULTRAM) 50 MG tablet Take 1 tablet (50 mg total) by mouth every 6 (six) hours as needed (pain). Reported on 10/14/2015 01/26/16   Stark Klein, MD  triamcinolone cream (KENALOG) 0.1 % APPLY TO AFFECTED AREA TWICE A DAY 04/09/15   [name], MD  warfarin (COUMADIN) 2 MG tablet TAKE 1 TABLET DAILY OR AS DIRECTED 01/26/16   [name], MD    Scheduled Meds: . famotidine (PEPCID) IV  20 mg Intravenous Q24H  . fentaNYL (SUBLIMAZE) injection  25 mcg Intravenous Once  . metoCLOPramide (REGLAN) injection  10 mg Intravenous Q6H  . metoprolol  5 mg Intravenous Q6H  . pantoprazole (PROTONIX) IV  40 mg Intravenous Q12H  . sodium chloride flush  3 mL Intravenous Q12H   Infusions: . 0.45 % NaCl with KCl 20 mEq / L 100 mL/hr at 02/03/16 2352   PRN Meds: diphenhydrAMINE, LORazepam, ondansetron (ZOFRAN) IV, prochlorperazine   Allergies as of 02/03/2016 - Review Complete 02/03/2016  Allergen Reaction Noted  . Amiodarone  10/09/2015  . Codeine Nausea And Vomiting   . Gabapentin Itching 10/28/2014  . Hydrocodone Other (See Comments) 03/07/2014  . Morphine and related Nausea And Vomiting 12/13/2013  . Requip [ropinirole hcl]  07/17/2013  . Zinc  04/22/2011    Family History  Problem Relation Age of Onset  . Colon cancer Mother 13  . Heart disease Mother   . Heart disease Father   . Heart attack Father   . Heart disease Maternal Grandmother     Social History   Social History  . Marital Status: Widowed    Spouse Name: N/A  . Number of Children: N/A  . Years of  Education: N/A   Occupational History  . Not on file.   Social History Main Topics  . Smoking status: Never Smoker   . Smokeless tobacco: Never Used  . Alcohol Use: No  . Drug Use: No  . Sexual Activity: No   Other Topics Concern  . Not on file   Social History Narrative   Lives in [place].  Continues to work.    REVIEW OF SYSTEMS: Constitutional:  Weakness, weight loss ENT:  No nose bleeds Pulm:  No dyspnea.  Cough per HPI CV:  No palpitations, no LE edema.  GU:  Oliguria, no hematuria, no frequency GI:  Per HPI Heme:  No obvious bleeding or excessive bruising   Transfusions:  None, she is JW.  Neuro:  No headaches, no peripheral tingling or numbness Derm:  No itching, no rash or sores.  Endocrine:  No sweats or chills.  No polyuria or dysuria Immunization:  Reviewed.  S/p multiple vaccinations.  Travel:  None beyond local counties in last few months.    PHYSICAL EXAM: Vital signs in last 24 hours: Filed Vitals:   02/03/16 2136 02/04/16 0521  BP:  146/89 147/86  Pulse: 104 114  Temp: 98.4 F (36.9 C) 97.6 F (36.4 C)  Resp:     Wt Readings from Last 3 Encounters:  02/03/16 60.8 kg (134 lb 0.6 oz)  01/17/16 73.846 kg (162 lb 12.8 oz)  01/07/16 65.318 kg (144 lb)    General: pleasant, weak and somewhat frail elderly WF.  sleepiing but easy to awaken Head:  No swelling or asymmetry  Eyes:  No icterus or pallor Ears:  HOH  Nose:  No congestion or discharge Mouth:  Barium coating on her tongue and on lips Neck:  No mass, no TMG Lungs:  Clear bil.  No dyspnea or cough Heart: tachy, irreg/irreg. Abdomen:  Soft, NT, ND.  Healing scar is benign..   Rectal: deferred   Musc/Skeltl: no joint swelling or gross deformity.  Slight kyphosis.  Extremities:  No CCE.  Feet are warm.    Neurologic:  No tremor, no limb weakness.  No gross deficits.  Alert, oriented x 3.   Skin:  No telangectasia or suspicious sores Psych:  Pleasant, cooperative, not anxious or  agitated.   Intake/Output from previous day: 05/30 0701 - 05/31 0700 In: 745 [I.V.:595; IV Piggyback:150] Out: 200 [Urine:200] Intake/Output this shift: Total I/O In: -  Out: 250 [Urine:250]  LAB RESULTS:  Recent Labs  02/03/16 1655 02/04/16 0454  WBC 8.3 8.4  HGB 13.4 12.6  HCT 42.6 40.6  PLT 261 252   BMET Lab Results  Component Value Date   NA 148* 02/04/2016   NA 147* 02/03/2016   NA 142 01/26/2016   K 3.4* 02/04/2016   K 3.4* 02/03/2016   K 3.5 01/26/2016   CL 117* 02/04/2016   CL 115* 02/03/2016   CL 108 01/26/2016   CO2 22 02/04/2016   CO2 21* 02/03/2016   CO2 26 01/26/2016   GLUCOSE 133* 02/04/2016   GLUCOSE 126* 02/03/2016   GLUCOSE 91 01/26/2016   BUN 17 02/04/2016   BUN 18 02/03/2016   BUN 8 01/26/2016   CREATININE 1.00 02/04/2016   CREATININE 1.06* 02/03/2016   CREATININE 1.01* 01/26/2016   CALCIUM 9.4 02/04/2016   CALCIUM 9.5 02/03/2016   CALCIUM 8.7* 01/26/2016   LFT  Recent Labs  02/03/16 1655 02/04/16 0454  PROT 6.8 6.5  ALBUMIN 3.5 3.3*  AST 19 20  ALT 12* 13*  ALKPHOS 90 83  BILITOT 0.7 0.7   PT/INR Lab Results  Component Value Date   INR 3.22* 02/04/2016   INR 2.84* 02/03/2016   INR 2.28* 01/26/2016   Hepatitis Panel No results for input(s): HEPBSAG, HCVAB, HEPAIGM, HEPBIGM in the last 72 hours. C-Diff No components found for: CDIFF Lipase     Component Value Date/Time   LIPASE 43 02/03/2016 1655    Drugs of Abuse  No results found for: LABOPIA, COCAINSCRNUR, LABBENZ, AMPHETMU, THCU, LABBARB   RADIOLOGY STUDIES: Dg Esophagus  02/04/2016  CLINICAL DATA:  Vomiting for 2 days 3 weeks status post partial gastrectomy. Evaluate for food impaction. EXAM: ESOPHOGRAM/BARIUM SWALLOW TECHNIQUE: Single contrast examination was performed using  thin barium. FLUOROSCOPY TIME:  Radiation Exposure Index (as provided by the fluoroscopic device): 238.9 uGy*m2 If the device does not provide the exposure index: Fluoroscopy Time:  54  seconds Number of Acquired Images:  Fluoro only. COMPARISON:  Postoperative upper GI series 01/23/2016. FINDINGS: Study was performed in the LPO and right lateral decubitus positions. The patient swallowed the barium without difficulty. There is mild presbyesophagus. No evidence of food impaction, mucosal  ulceration or stricture. Contrast empties into the gastric pouch and proximal small bowel. No evidence of contrast leak. No laryngeal penetration observed. IMPRESSION: No evidence of food impaction, obstruction or extravasation. Presbyesophagus. Electronically Signed   By: Richardean Sale M.D.   On: 02/04/2016 08:50   Dg Abd Acute W/chest  02/03/2016  CLINICAL DATA:  Vomiting after stomach surgery 01/15/2016. Partial gastrectomy for cancer. EXAM: DG ABDOMEN ACUTE W/ 1V CHEST COMPARISON:  11/12/2015 abdominal CT FINDINGS: Sutures in the left upper quadrant correlating with partial gastrectomy. Study is limited by gasless abdomen. No dilated loops are seen. No evidence of pneumoperitoneum. Trace left effusion. No pneumonia or edema. No cardiomegaly. Dual-chamber pacer from the right with leads in unremarkable position. IMPRESSION: 1. Gasless abdomen, a nonspecific finding that limits sensitivity. No indication of bowel obstruction or perforation. 2. Trace left effusion. Electronically Signed   By: Monte Fantasia M.D.   On: 02/03/2016 18:21    ENDOSCOPIC STUDIES: Per HPI.    IMPRESSION:   *  Gastric adenocarcinoma.  S/p 01/15/16 subtotal gastrectomy.  Delayed gastric emptying/gastroparesis on 2008 emptying study.    Persistent dysphagia, regurgitation since surgery, now with 2 days of n/v.  Xray and contrast studies reveal only mild presbyesophagus.  Note TNTC WBCs, + nitrites on U/A.  Not yet on abx. Treated with Amoxil for lower growth enterococcal UTI in 11/2015.   *  PAF, hx retinal artery occlusion.  On chronic Coumadin, none since admission.  INR 3.2.    *  Anemia.  Started on parenteral iron,  Aranesp and B12 injections during 11/2015 admission   PLAN:     *  EGD?  Timing to be determined.    *  Needs urine clx. Will order.      Azucena Freed  02/04/2016, 10:07 AM Pager: [phone]      Attending physician's note   I have taken a history, examined the patient and reviewed the chart. I agree with the Advanced Practitioner's note, impression and recommendations. T2, N0 gastric adenocarcinoma s/p subtotal gastrectomy on 5/11 in a patient with a history of gastroparesis. She has had dysphagia, regurgitation since surgery and now nausea, vomiting for 2 days. Barium esophagram today showed presbyesophagus and contrast passing into the proximal small bowel. R/O exacerbation of gastroparesis, partial GOO, esophagitis, ulcer, etc. UTI needs treatment which could have exacerbated her GI complaints. IV PPI bid for now. Agree with holding Coumadin and Vit K as given. EGD when INR < 2.0. Mgmt discussed with [name].  Lucio Edward, MD [facility] [phone] Mon-Fri 8a-5p [phone] after 5p, weekends, holidays

## 2016-02-05 NOTE — Interval H&P Note (Signed)
History and Physical Interval Note:  02/05/2016 10:07 AM  [name]  has presented today for surgery, with the diagnosis of nausea, vomiting, dysphagia  The various methods of treatment have been discussed with the patient and family. After consideration of risks, benefits and other options for treatment, the patient has consented to  Procedure(s): ESOPHAGOGASTRODUODENOSCOPY (EGD) (N/A) as a surgical intervention .  The patient's history has been reviewed, patient examined, no change in status, stable for surgery.  I have reviewed the patient's chart and labs.  Questions were answered to the patient's satisfaction.     Pricilla Riffle. Fuller Plan

## 2016-02-05 NOTE — Progress Notes (Signed)
Paged on call with vital signs post digoxin administration.

## 2016-02-05 NOTE — Anesthesia Preprocedure Evaluation (Addendum)
Anesthesia Evaluation  Patient identified by MRN, date of birth, ID band Patient awake    Reviewed: Allergy & Precautions, NPO status , Patient's Chart, lab work & pertinent test results, reviewed documented beta blocker date and time   History of Anesthesia Complications (+) PONV and history of anesthetic complications  Airway Mallampati: II  TM Distance: >3 FB Neck ROM: Full    Dental  (+) Dental Advisory Given, Upper Dentures   Pulmonary pneumonia, unresolved,     + decreased breath sounds      Cardiovascular hypertension, Pt. on medications and Pt. on home beta blockers + CAD and + Peripheral Vascular Disease  Normal cardiovascular exam+ dysrhythmias Atrial Fibrillation + pacemaker + Valvular Problems/Murmurs AS and AI  Rhythm:Regular Rate:Normal  PDA ligation at age 39 Tachy-brady syndrome s/p PPM insertion  Echo 2/17: Study Conclusions  - Left ventricle: The cavity size was normal. Wall thickness wasincreased in a pattern of moderate LVH. Systolic function wasnormal. The estimated ejection fraction was in the range of 60%to 65%. Wall motion was normal; there were no regional wallmotion abnormalities. - Aortic valve: There was mild stenosis. There was moderateregurgitation. Valve area (VTI): 0.87 cm^2. Valve area (Vmax):0.87 cm^2. Valve area (Vmean): 0.85 cm^2. - Left atrium: The atrium was mildly dilated. - Tricuspid valve: There was moderate regurgitation.    Neuro/Psych  Headaches, PSYCHIATRIC DISORDERS Anxiety  Neuromuscular disease (neuropathy right leg)    GI/Hepatic Neg liver ROS, GERD  Medicated and Poorly Controlled,GIB Gastroparesis   Endo/Other  Hypothyroidism   Renal/GU Renal InsufficiencyRenal diseaseRenal cancer s/p nephrectomy     Musculoskeletal negative musculoskeletal ROS (+)   Abdominal   Peds  Hematology  (+) Blood dyscrasia, anemia , JEHOVAH'S WITNESSPatient states that she IS OKAY  WITH ALBUMIN AND CELL SAVER   Anesthesia Other Findings Day of surgery medications reviewed with the patient.  Reproductive/Obstetrics                            Anesthesia Physical  Anesthesia Plan  ASA: III  Anesthesia Plan: MAC   Post-op Pain Management:    Induction: Intravenous  Airway Management Planned: Natural Airway and Nasal Cannula  Additional Equipment:   Intra-op Plan:   Post-operative Plan:   Informed Consent: I have reviewed the patients History and Physical, chart, labs and discussed the procedure including the risks, benefits and alternatives for the proposed anesthesia with the patient or authorized representative who has indicated his/her understanding and acceptance.   Dental advisory given  Plan Discussed with: CRNA and Anesthesiologist  Anesthesia Plan Comments:        Anesthesia Quick Evaluation

## 2016-02-06 ENCOUNTER — Inpatient Hospital Stay (HOSPITAL_COMMUNITY): Payer: Medicare Other

## 2016-02-06 ENCOUNTER — Observation Stay (HOSPITAL_COMMUNITY): Payer: Medicare Other

## 2016-02-06 ENCOUNTER — Encounter (HOSPITAL_COMMUNITY): Payer: Self-pay | Admitting: Gastroenterology

## 2016-02-06 DIAGNOSIS — N390 Urinary tract infection, site not specified: Secondary | ICD-10-CM | POA: Diagnosis not present

## 2016-02-06 DIAGNOSIS — G43A1 Cyclical vomiting, intractable: Secondary | ICD-10-CM | POA: Diagnosis not present

## 2016-02-06 DIAGNOSIS — I130 Hypertensive heart and chronic kidney disease with heart failure and stage 1 through stage 4 chronic kidney disease, or unspecified chronic kidney disease: Secondary | ICD-10-CM | POA: Diagnosis present

## 2016-02-06 DIAGNOSIS — I4891 Unspecified atrial fibrillation: Secondary | ICD-10-CM | POA: Diagnosis not present

## 2016-02-06 DIAGNOSIS — Z85528 Personal history of other malignant neoplasm of kidney: Secondary | ICD-10-CM | POA: Diagnosis not present

## 2016-02-06 DIAGNOSIS — Z79899 Other long term (current) drug therapy: Secondary | ICD-10-CM | POA: Diagnosis not present

## 2016-02-06 DIAGNOSIS — I482 Chronic atrial fibrillation: Secondary | ICD-10-CM | POA: Diagnosis not present

## 2016-02-06 DIAGNOSIS — R197 Diarrhea, unspecified: Secondary | ICD-10-CM | POA: Diagnosis not present

## 2016-02-06 DIAGNOSIS — K259 Gastric ulcer, unspecified as acute or chronic, without hemorrhage or perforation: Secondary | ICD-10-CM | POA: Diagnosis present

## 2016-02-06 DIAGNOSIS — Z950 Presence of cardiac pacemaker: Secondary | ICD-10-CM | POA: Diagnosis not present

## 2016-02-06 DIAGNOSIS — Z8249 Family history of ischemic heart disease and other diseases of the circulatory system: Secondary | ICD-10-CM | POA: Diagnosis not present

## 2016-02-06 DIAGNOSIS — Z905 Acquired absence of kidney: Secondary | ICD-10-CM | POA: Diagnosis not present

## 2016-02-06 DIAGNOSIS — Z7901 Long term (current) use of anticoagulants: Secondary | ICD-10-CM | POA: Diagnosis not present

## 2016-02-06 DIAGNOSIS — Z888 Allergy status to other drugs, medicaments and biological substances status: Secondary | ICD-10-CM | POA: Diagnosis not present

## 2016-02-06 DIAGNOSIS — I080 Rheumatic disorders of both mitral and aortic valves: Secondary | ICD-10-CM | POA: Diagnosis present

## 2016-02-06 DIAGNOSIS — Z885 Allergy status to narcotic agent status: Secondary | ICD-10-CM | POA: Diagnosis not present

## 2016-02-06 DIAGNOSIS — R05 Cough: Secondary | ICD-10-CM | POA: Diagnosis not present

## 2016-02-06 DIAGNOSIS — K2100 Gastro-esophageal reflux disease with esophagitis, without bleeding: Secondary | ICD-10-CM | POA: Insufficient documentation

## 2016-02-06 DIAGNOSIS — K209 Esophagitis, unspecified: Secondary | ICD-10-CM | POA: Diagnosis not present

## 2016-02-06 DIAGNOSIS — D649 Anemia, unspecified: Secondary | ICD-10-CM | POA: Diagnosis present

## 2016-02-06 DIAGNOSIS — I10 Essential (primary) hypertension: Secondary | ICD-10-CM | POA: Diagnosis not present

## 2016-02-06 DIAGNOSIS — R1310 Dysphagia, unspecified: Secondary | ICD-10-CM | POA: Diagnosis present

## 2016-02-06 DIAGNOSIS — I481 Persistent atrial fibrillation: Secondary | ICD-10-CM | POA: Diagnosis not present

## 2016-02-06 DIAGNOSIS — K529 Noninfective gastroenteritis and colitis, unspecified: Secondary | ICD-10-CM | POA: Diagnosis present

## 2016-02-06 DIAGNOSIS — Z452 Encounter for adjustment and management of vascular access device: Secondary | ICD-10-CM | POA: Diagnosis not present

## 2016-02-06 DIAGNOSIS — R6510 Systemic inflammatory response syndrome (SIRS) of non-infectious origin without acute organ dysfunction: Secondary | ICD-10-CM

## 2016-02-06 DIAGNOSIS — F419 Anxiety disorder, unspecified: Secondary | ICD-10-CM | POA: Diagnosis present

## 2016-02-06 DIAGNOSIS — G2581 Restless legs syndrome: Secondary | ICD-10-CM | POA: Diagnosis present

## 2016-02-06 DIAGNOSIS — J811 Chronic pulmonary edema: Secondary | ICD-10-CM | POA: Diagnosis not present

## 2016-02-06 DIAGNOSIS — J181 Lobar pneumonia, unspecified organism: Secondary | ICD-10-CM | POA: Diagnosis not present

## 2016-02-06 DIAGNOSIS — N183 Chronic kidney disease, stage 3 (moderate): Secondary | ICD-10-CM | POA: Diagnosis present

## 2016-02-06 DIAGNOSIS — B370 Candidal stomatitis: Secondary | ICD-10-CM | POA: Diagnosis not present

## 2016-02-06 DIAGNOSIS — L89151 Pressure ulcer of sacral region, stage 1: Secondary | ICD-10-CM | POA: Diagnosis present

## 2016-02-06 DIAGNOSIS — Z951 Presence of aortocoronary bypass graft: Secondary | ICD-10-CM | POA: Diagnosis not present

## 2016-02-06 DIAGNOSIS — H5442 Blindness, left eye, normal vision right eye: Secondary | ICD-10-CM | POA: Diagnosis present

## 2016-02-06 DIAGNOSIS — E871 Hypo-osmolality and hyponatremia: Secondary | ICD-10-CM | POA: Diagnosis present

## 2016-02-06 DIAGNOSIS — I69398 Other sequelae of cerebral infarction: Secondary | ICD-10-CM | POA: Diagnosis not present

## 2016-02-06 DIAGNOSIS — Y842 Radiological procedure and radiotherapy as the cause of abnormal reaction of the patient, or of later complication, without mention of misadventure at the time of the procedure: Secondary | ICD-10-CM | POA: Diagnosis present

## 2016-02-06 DIAGNOSIS — E785 Hyperlipidemia, unspecified: Secondary | ICD-10-CM | POA: Diagnosis present

## 2016-02-06 DIAGNOSIS — I2510 Atherosclerotic heart disease of native coronary artery without angina pectoris: Secondary | ICD-10-CM | POA: Diagnosis present

## 2016-02-06 DIAGNOSIS — K911 Postgastric surgery syndromes: Secondary | ICD-10-CM | POA: Diagnosis not present

## 2016-02-06 DIAGNOSIS — I480 Paroxysmal atrial fibrillation: Secondary | ICD-10-CM | POA: Diagnosis present

## 2016-02-06 DIAGNOSIS — R1110 Vomiting, unspecified: Secondary | ICD-10-CM | POA: Diagnosis not present

## 2016-02-06 DIAGNOSIS — E860 Dehydration: Secondary | ICD-10-CM | POA: Diagnosis not present

## 2016-02-06 DIAGNOSIS — E43 Unspecified severe protein-calorie malnutrition: Secondary | ICD-10-CM | POA: Diagnosis not present

## 2016-02-06 DIAGNOSIS — E876 Hypokalemia: Secondary | ICD-10-CM | POA: Diagnosis not present

## 2016-02-06 DIAGNOSIS — Z96641 Presence of right artificial hip joint: Secondary | ICD-10-CM | POA: Diagnosis present

## 2016-02-06 DIAGNOSIS — E538 Deficiency of other specified B group vitamins: Secondary | ICD-10-CM | POA: Diagnosis present

## 2016-02-06 DIAGNOSIS — C169 Malignant neoplasm of stomach, unspecified: Secondary | ICD-10-CM | POA: Diagnosis not present

## 2016-02-06 DIAGNOSIS — K2970 Gastritis, unspecified, without bleeding: Secondary | ICD-10-CM | POA: Diagnosis present

## 2016-02-06 DIAGNOSIS — Z800 Family history of malignant neoplasm of digestive organs: Secondary | ICD-10-CM | POA: Diagnosis not present

## 2016-02-06 DIAGNOSIS — Z8542 Personal history of malignant neoplasm of other parts of uterus: Secondary | ICD-10-CM | POA: Diagnosis not present

## 2016-02-06 DIAGNOSIS — R6881 Early satiety: Secondary | ICD-10-CM | POA: Diagnosis present

## 2016-02-06 DIAGNOSIS — Z96651 Presence of right artificial knee joint: Secondary | ICD-10-CM | POA: Diagnosis present

## 2016-02-06 DIAGNOSIS — R112 Nausea with vomiting, unspecified: Secondary | ICD-10-CM | POA: Diagnosis present

## 2016-02-06 DIAGNOSIS — K208 Other esophagitis: Secondary | ICD-10-CM | POA: Diagnosis not present

## 2016-02-06 DIAGNOSIS — E870 Hyperosmolality and hypernatremia: Secondary | ICD-10-CM | POA: Diagnosis present

## 2016-02-06 DIAGNOSIS — E039 Hypothyroidism, unspecified: Secondary | ICD-10-CM | POA: Diagnosis present

## 2016-02-06 LAB — COMPREHENSIVE METABOLIC PANEL
ALT: 13 U/L — ABNORMAL LOW (ref 14–54)
ANION GAP: 9 (ref 5–15)
AST: 23 U/L (ref 15–41)
Albumin: 2.8 g/dL — ABNORMAL LOW (ref 3.5–5.0)
Alkaline Phosphatase: 74 U/L (ref 38–126)
BUN: 10 mg/dL (ref 6–20)
CALCIUM: 8.9 mg/dL (ref 8.9–10.3)
CHLORIDE: 113 mmol/L — AB (ref 101–111)
CO2: 20 mmol/L — AB (ref 22–32)
Creatinine, Ser: 0.98 mg/dL (ref 0.44–1.00)
GFR calc non Af Amer: 53 mL/min — ABNORMAL LOW (ref >60)
Glucose, Bld: 88 mg/dL (ref 65–99)
Potassium: 3.5 mmol/L (ref 3.5–5.1)
SODIUM: 142 mmol/L (ref 135–145)
Total Bilirubin: 0.9 mg/dL (ref 0.3–1.2)
Total Protein: 5.5 g/dL — ABNORMAL LOW (ref 6.5–8.1)

## 2016-02-06 LAB — DIFFERENTIAL
BASOS PCT: 1 %
Basophils Absolute: 0 10*3/uL (ref 0.0–0.1)
EOS PCT: 6 %
Eosinophils Absolute: 0.4 10*3/uL (ref 0.0–0.7)
LYMPHS PCT: 22 %
Lymphs Abs: 1.5 10*3/uL (ref 0.7–4.0)
Monocytes Absolute: 0.6 10*3/uL (ref 0.1–1.0)
Monocytes Relative: 9 %
NEUTROS PCT: 62 %
Neutro Abs: 4.2 10*3/uL (ref 1.7–7.7)

## 2016-02-06 LAB — MAGNESIUM: MAGNESIUM: 1.6 mg/dL — AB (ref 1.7–2.4)

## 2016-02-06 LAB — PROTIME-INR
INR: 1.36 (ref 0.00–1.49)
Prothrombin Time: 16.8 seconds — ABNORMAL HIGH (ref 11.6–15.2)

## 2016-02-06 LAB — CBC
HCT: 36.6 % (ref 36.0–46.0)
Hemoglobin: 11.1 g/dL — ABNORMAL LOW (ref 12.0–15.0)
MCH: 28.6 pg (ref 26.0–34.0)
MCHC: 30.3 g/dL (ref 30.0–36.0)
MCV: 94.3 fL (ref 78.0–100.0)
PLATELETS: 186 10*3/uL (ref 150–400)
RBC: 3.88 MIL/uL (ref 3.87–5.11)
RDW: 17.6 % — AB (ref 11.5–15.5)
WBC: 6.7 10*3/uL (ref 4.0–10.5)

## 2016-02-06 LAB — PREALBUMIN: PREALBUMIN: 18.1 mg/dL (ref 18–38)

## 2016-02-06 LAB — TRIGLYCERIDES: Triglycerides: 132 mg/dL (ref <150)

## 2016-02-06 LAB — PHOSPHORUS: PHOSPHORUS: 3.4 mg/dL (ref 2.5–4.6)

## 2016-02-06 LAB — GLUCOSE, CAPILLARY: Glucose-Capillary: 102 mg/dL — ABNORMAL HIGH (ref 65–99)

## 2016-02-06 MED ORDER — DEXTROSE 5 % IV SOLN
500.0000 mg | INTRAVENOUS | Status: DC
  Administered 2016-02-06: 500 mg via INTRAVENOUS
  Filled 2016-02-06 (×2): qty 500

## 2016-02-06 MED ORDER — INSULIN ASPART 100 UNIT/ML ~~LOC~~ SOLN
0.0000 [IU] | Freq: Four times a day (QID) | SUBCUTANEOUS | Status: DC
  Administered 2016-02-07 – 2016-02-10 (×6): 1 [IU] via SUBCUTANEOUS

## 2016-02-06 MED ORDER — TRACE MINERALS CR-CU-MN-SE-ZN 10-1000-500-60 MCG/ML IV SOLN
INTRAVENOUS | Status: AC
  Administered 2016-02-06: 18:00:00 via INTRAVENOUS
  Filled 2016-02-06: qty 960

## 2016-02-06 MED ORDER — MAGNESIUM SULFATE 2 GM/50ML IV SOLN
2.0000 g | Freq: Once | INTRAVENOUS | Status: AC
  Administered 2016-02-06: 2 g via INTRAVENOUS
  Filled 2016-02-06: qty 50

## 2016-02-06 MED ORDER — FAT EMULSION 20 % IV EMUL
240.0000 mL | INTRAVENOUS | Status: AC
  Administered 2016-02-06: 240 mL via INTRAVENOUS
  Filled 2016-02-06: qty 250

## 2016-02-06 MED ORDER — SODIUM CHLORIDE 0.9 % IV BOLUS (SEPSIS)
500.0000 mL | Freq: Once | INTRAVENOUS | Status: AC
  Administered 2016-02-06: 500 mL via INTRAVENOUS

## 2016-02-06 MED ORDER — WARFARIN - PHARMACIST DOSING INPATIENT: Freq: Every day | Status: DC

## 2016-02-06 MED ORDER — METOPROLOL TARTRATE 5 MG/5ML IV SOLN
2.5000 mg | Freq: Once | INTRAVENOUS | Status: AC
  Administered 2016-02-06: 2.5 mg via INTRAVENOUS
  Filled 2016-02-06: qty 5

## 2016-02-06 MED ORDER — POTASSIUM CHLORIDE IN NACL 20-0.45 MEQ/L-% IV SOLN
INTRAVENOUS | Status: AC
  Administered 2016-02-06 – 2016-02-07 (×2): via INTRAVENOUS
  Filled 2016-02-06 (×3): qty 1000

## 2016-02-06 MED ORDER — HEPARIN (PORCINE) IN NACL 100-0.45 UNIT/ML-% IJ SOLN
1100.0000 [IU]/h | INTRAMUSCULAR | Status: DC
  Administered 2016-02-06: 950 [IU]/h via INTRAVENOUS
  Administered 2016-02-07 – 2016-02-10 (×4): 1200 [IU]/h via INTRAVENOUS
  Administered 2016-02-11: 1400 [IU]/h via INTRAVENOUS
  Administered 2016-02-12: 1200 [IU]/h via INTRAVENOUS
  Filled 2016-02-06 (×7): qty 250

## 2016-02-06 MED ORDER — SODIUM CHLORIDE 0.9% FLUSH
10.0000 mL | INTRAVENOUS | Status: DC | PRN
  Administered 2016-02-07: 10 mL
  Administered 2016-02-07: 20 mL
  Administered 2016-02-08 – 2016-02-11 (×5): 10 mL
  Filled 2016-02-06 (×7): qty 40

## 2016-02-06 MED ORDER — WARFARIN SODIUM 4 MG PO TABS
4.0000 mg | ORAL_TABLET | Freq: Once | ORAL | Status: DC
  Filled 2016-02-06: qty 1

## 2016-02-06 NOTE — Care Management Note (Signed)
Case Management Note  Patient Details  Name: [name] MRN: [id number] Date of Birth: 05-Jul-1934  Subjective/Objective:                    Action/Plan:  Was told in progression patient discharging to home on TNA tomorrow. Called Dr Marlowe Aschoff office . Spoke with Sunday Spillers . Awaiting call back. Expected Discharge Date:                  Expected Discharge Plan:  [name]  In-House Referral:     Discharge planning Services     Post Acute Care Choice:    Choice offered to:     DME Arranged:    DME Agency:     HH Arranged:    [place] Agency:     Status of Service:  In process, will continue to follow  Medicare Important Message Given:    Date Medicare IM Given:    Medicare IM give by:    Date Additional Medicare IM Given:    Additional Medicare Important Message give by:     If discussed at [place] of Stay Meetings, dates discussed:    Additional Comments:  Marilu Favre, RN 02/06/2016, 11:19 AM

## 2016-02-06 NOTE — Care Management Note (Signed)
Case Management Note  Patient Details  Name: CARMESHIA MAULDING MRN: [id number] Date of Birth: [birth year]  Subjective/Objective:                    Action/Plan: Spoke to patient and daughter at bedside regarding home health and TNA. Offered choice . Daughter states her mother is already active with [facility] and they want to remain with [facility] . Referral given to Barrett Henle with [facility].  Expected Discharge Date:                  Expected Discharge Plan:  [place]  In-House Referral:     Discharge planning Services  CM Consult  Post Acute Care Choice:  Home Health Choice offered to:  Patient, Adult Children  DME Arranged:    DME Agency:     HH Arranged:  RN, PT, Nurse's Aide, Speech Therapy HH Agency:  [facility]  Status of Service:  Completed, signed off  Medicare Important Message Given:    Date Medicare IM Given:    Medicare IM give by:    Date Additional Medicare IM Given:    Additional Medicare Important Message give by:     If discussed at [place] of Stay Meetings, dates discussed:    Additional Comments:  Marilu Favre, RN 02/06/2016, 2:44 PM

## 2016-02-06 NOTE — Progress Notes (Signed)
Family requesting pt. Be placed back on coumadin for A-Fibb. I paged Dr. Clementeen Graham. Coumadin re-ordered, however pt. Not tolerating PO very well. I paged Dr. Clementeen Graham again. Per Dr. Clementeen Graham: no new orders tonight. If pt. Cannot tolerate PO by tomorrow, he will put her on something else.

## 2016-02-06 NOTE — Progress Notes (Signed)
Peripherally Inserted Central Catheter/Midline Placement  The IV Nurse has discussed with the patient and/or persons authorized to consent for the patient, the purpose of this procedure and the potential benefits and risks involved with this procedure.  The benefits include less needle sticks, lab draws from the catheter and patient may be discharged home with the catheter.  Risks include, but not limited to, infection, bleeding, blood clot (thrombus formation), and puncture of an artery; nerve damage and irregular heat beat.  Alternatives to this procedure were also discussed.  PICC/Midline Placement Documentation        Wendall Isabell, Nicolette Bang 02/06/2016, 4:16 PM

## 2016-02-06 NOTE — Progress Notes (Addendum)
Nutrition Follow-up  DOCUMENTATION CODES:   Not applicable  INTERVENTION:   -TPN management per pharmacy -RD will follow for diet advancement and supplement as appropriate  NUTRITION DIAGNOSIS:   Inadequate oral intake related to altered GI function as evidenced by meal completion < 25%.  Ongoing  GOAL:   Patient will meet greater than or equal to 90% of their needs  Unmet  MONITOR:   PO intake, Diet advancement, Labs, Weight trends, Skin, I & O's  REASON FOR ASSESSMENT:   Consult New TPN/TNA  ASSESSMENT:   [name] is a 82 y.o. female with medical history significant of paroxysmal atrial fibrillation, CABG, aortic root dilation, moderate aortic stenosis, tachycardia-bradycardia syndrome on10/28/2011 (status post pacemaker placement in 2012), GERD, peripheral neuropathy, restless leg syndrome, hypertension, hyperlipidemia, hypothyroidism, CVA with left eye blindness, recently diagnosed with adenocarcinoma of the stomach and is S/P gastrectomy earlier this month by [name] who comes to the emergency department with complaints of nausea and multiple episodes of emesis since yesterday evening.  RD consulted for new TPN.   Pt underwent EGD on 02/05/16 which revealed reflux esophagitis and several tiny ulcers at anastomosis.   Pt sitting up in recliner at time of visit; appears more well and talkative compared to prior RD visit. Pt and daughters confirm that pt has been unable to tolerate PO's. Per daughters, even medications "come right back up". They are very concerned over pt's nutritional status and appear eager to start TPN.   Per pharmacy note, plan to start Clinimix E 5/15 at 47ml/hr tonight and 20% lipid emulsion at 36ml/hr at 1800. This will provide 1162 kcals and 48 grams protein (meeting 73% of estimated kcal needs and 64% of estimated protein needs).   Nutrition-Focused physical exam completed. Findings are no fat depletion, no muscle depletion, and no  edema.   Case discussed with RN. She confirms plan to place PICC and start TPN this evening. She also confirms pt's inability to keep down food and liquids.  Labs reviewed.  Diet Order:  Diet bariatric clear liquid Room service appropriate?: Yes; Fluid consistency:: Thin TPN (CLINIMIX-E) Adult  Skin:  Wound (see comment) (stage I sacrum)  Last BM:  02/03/16  Height:   Ht Readings from Last 1 Encounters:  02/03/16 5\' 1"  (1.549 m)    Weight:   Wt Readings from Last 1 Encounters:  02/03/16 134 lb 0.6 oz (60.8 kg)    Ideal Body Weight:  47.7 kg  BMI:  Body mass index is 25.34 kg/(m^2).  Estimated Nutritional Needs:   Kcal:  1600-1800  Protein:  75-90 grams  Fluid:  1.6-1.8 L  EDUCATION NEEDS:   No education needs identified at this time  Tamica Covell A. Jimmye Norman, RD, LDN, CDE Pager: [phone] After hours Pager: [phone]

## 2016-02-06 NOTE — Progress Notes (Addendum)
PROGRESS NOTE                                                                                                                                                                                                             Patient Demographics:    [name], is a 82 y.o. female, DOB - 1934-03-12, [id number]  Admit date - 02/03/2016   Admitting Physician [name], MD  Outpatient Primary MD for the patient is [name], MD  LOS -   Outpatient Specialists: [name]  Chief Complaint  Patient presents with  . Emesis       Brief Narrative   82 year old female with paroxysmal A. fib, CAD with history of CABG, moderate BS, tachycardia-bradycardia syndrome status post pacemaker, GERD, peripheral neuropathy, hypertension, hypothyroidism, CVA with left eye blindness, recently diagnosed adenocarcinoma of the stomach status post gastrectomy done earlier this month by [name] presented to the ED with nausea with multiple episodes of vomiting for 1 day.. Patient reported dysphagia as well. Reported frequent loose stools without worsening symptoms. No melena or bright red blood per rectum. Denies fever or chills, headache, shortness of breath or chest pain. Denied dysuria. Abdominal x-ray negative for obstruction. GI and surgery following.   Subjective:   Still nauseous and vomited this morning. Tachycardic last evening. Was febrile to 101F. CXR shows possible consolidation.   Assessment  & Plan :    Principal Problem:   Intractable nausea and vomiting -Barium esophagogram-- presbyesophagus with contrast passing into the proximal small bowel.  - EGD showing mid-distal third esophagitis without bleeding. Patent surgical anastomosis. Showed mid-distal reflux esophagitis, ,gastric ulcers at the gastric anastomosis with no bleeding stigmata. (Largest measuring 5 mm) -BID  PPI, Reglan 3 times a day before  meals and at bedtime. Recommend strict antireflux measures with HOB elevation at all times. clear liquid diet advance to bariatric diet. -Given severe malnutrition, PICC line ordered to start TNA by [name].  Active Problems:  SIRS Pt febrile with tachycardia. CXR shows increased left lung base densities suggestive of pleural fluid and consolidation.on rocephin,  add azithromycin ( monitor INR)  UTI On empiric Rocephin. Follow cultures  Uncontrolled hypertension Blood pressure elevated..  Placed on scheduled Lopressor and when necessary IV hydralazine.  Sinus tachycardia  possibly due to underlying infection. Controlled today.  Adenocarcinoma of stomach (  [place]) Status post subtotal gastrectomy recently. [name] following.    Chronic atrial fibrillation (HCC) Resume Coumadin once tolerating by mouth  Hypokalemia/hyponatremia   secondary to dehydration Replenishing with fluids    Hypothyroidism Synthroid at IV dose.  CAD with history of CABG IV metoprolol.    Code Status : Full code  Family Communication  : Daughters at bedside  Disposition Plan  : Home possibly in 1-2 days if symptoms improve.  Barriers For Discharge : Ongoing nausea and vomiting, SIRS  Consults  :   [facility]  Procedures  :  barium esophagogram EGD  DVT Prophylaxis  :  On Coumadin  Lab Results  Component Value Date   PLT 186 02/06/2016    Antibiotics  :    Anti-infectives    Start     Dose/Rate Route Frequency Ordered Stop   02/04/16 1700  cefTRIAXone (ROCEPHIN) 1 g in dextrose 5 % 50 mL IVPB     1 g 100 mL/hr over 30 Minutes Intravenous Every 24 hours 02/04/16 1553          Objective:   Filed Vitals:   02/05/16 2355 02/06/16 0233 02/06/16 0551 02/06/16 1426  BP: 156/83 158/76 161/90 184/83  Pulse: 106 105 86 86  Temp: 98.7 F (37.1 C) 99.2 F (37.3 C) 98.8 F (37.1 C) 98.7 F (37.1 C)  TempSrc: Oral Axillary Oral Oral  Resp: 21 20 19 19   Height:      Weight:       SpO2: 97% 96% 99% 98%    Wt Readings from Last 3 Encounters:  02/03/16 60.8 kg (134 lb 0.6 oz)  01/17/16 73.846 kg (162 lb 12.8 oz)  01/07/16 65.318 kg (144 lb)     Intake/Output Summary (Last 24 hours) at 02/06/16 1445 Last data filed at 02/06/16 1433  Gross per 24 hour  Intake    460 ml  Output    875 ml  Net   -415 ml     Physical Exam  Gen: Appears fatigued HEENT: no pallor, Dry mucosa, supple neck Chest: clear b/l, no added sounds CVS: S1 and S2 irregular regular, no murmurs rubs or gallop GI: soft, NT, ND, BS+ Musculoskeletal: warm, no edema     Data Review:    CBC  Recent Labs Lab 02/03/16 1655 02/04/16 0454 02/05/16 0245 02/06/16 0602  WBC 8.3 8.4 8.4 6.7  HGB 13.4 12.6 12.1 11.1*  HCT 42.6 40.6 39.2 36.6  PLT 261 252 212 186  MCV 92.4 92.7 92.7 94.3  MCH 29.1 28.8 28.6 28.6  MCHC 31.5 31.0 30.9 30.3  RDW 17.8* 17.7* 17.8* 17.6*  LYMPHSABS  --  1.3 1.9 1.5  MONOABS  --  0.7 0.5 0.6  EOSABS  --  0.2 0.4 0.4  BASOSABS  --  0.1 0.1 0.0    Chemistries   Recent Labs Lab 02/03/16 1655 02/04/16 0454 02/05/16 0245 02/06/16 0602  NA 147* 148* 146* 142  K 3.4* 3.4* 3.5 3.5  CL 115* 117* 118* 113*  CO2 21* 22 18* 20*  GLUCOSE 126* 133* 103* 88  BUN 18 17 15 10   CREATININE 1.06* 1.00 0.98 0.98  CALCIUM 9.5 9.4 9.1 8.9  MG 1.7  --   --  1.6*  AST 19 20 24 23   ALT 12* 13* 13* 13*  ALKPHOS 90 83 80 74  BILITOT 0.7 0.7 0.6 0.9   ------------------------------------------------------------------------------------------------------------------  Recent Labs  02/06/16 0602  TRIG 132    Lab Results  Component Value Date   HGBA1C 5.7* 10/09/2015   ------------------------------------------------------------------------------------------------------------------ No results for input(s): TSH, T4TOTAL, T3FREE, THYROIDAB in the last 72 hours.  Invalid input(s):  FREET3 ------------------------------------------------------------------------------------------------------------------ No results for input(s): VITAMINB12, FOLATE, FERRITIN, TIBC, IRON, RETICCTPCT in the last 72 hours.  Coagulation profile  Recent Labs Lab 02/03/16 1655 02/04/16 0454 02/05/16 0245 02/06/16 0602  INR 2.84* 3.22* 1.91* 1.36    No results for input(s): DDIMER in the last 72 hours.  Cardiac Enzymes No results for input(s): CKMB, TROPONINI, MYOGLOBIN in the last 168 hours.  Invalid input(s): CK ------------------------------------------------------------------------------------------------------------------ No results found for: BNP  Inpatient Medications  Scheduled Meds: . cefTRIAXone (ROCEPHIN)  IV  1 g Intravenous Q24H  . fentaNYL (SUBLIMAZE) injection  25 mcg Intravenous Once  . [START ON 02/07/2016] insulin aspart  0-9 Units Subcutaneous Q6H  . levothyroxine  25 mcg Intravenous QAC breakfast  . metoCLOPramide (REGLAN) injection  5 mg Intravenous TID AC & HS  . metoprolol  5 mg Intravenous Q6H  . pantoprazole (PROTONIX) IV  40 mg Intravenous Q12H  . sodium chloride flush  3 mL Intravenous Q12H   Continuous Infusions: . 0.45 % NaCl with KCl 20 mEq / L 100 mL/hr at 02/06/16 0505  . 0.45 % NaCl with KCl 20 mEq / L    . Marland KitchenTPN (CLINIMIX-E) Adult     And  . fat emulsion     PRN Meds:.acetaminophen, diphenhydrAMINE, hydrALAZINE, LORazepam, prochlorperazine  Micro Results Recent Results (from the past 240 hour(s))  Urine culture     Status: Abnormal (Preliminary result)   Collection Time: 02/04/16  6:35 PM  Result Value Ref Range Status   Specimen Description URINE, CLEAN CATCH  Final   Special Requests Normal  Final   Culture (A)  Final    50,000 COLONIES/mL ENTEROCOCCUS SPECIES 50,000 COLONIES/mL GRAM NEGATIVE RODS    Report Status PENDING  Incomplete    Radiology Reports Dg Esophagus  02/04/2016  CLINICAL DATA:  Vomiting for 2 days 3 weeks  status post partial gastrectomy. Evaluate for food impaction. EXAM: ESOPHOGRAM/BARIUM SWALLOW TECHNIQUE: Single contrast examination was performed using  thin barium. FLUOROSCOPY TIME:  Radiation Exposure Index (as provided by the fluoroscopic device): 238.9 uGy*m2 If the device does not provide the exposure index: Fluoroscopy Time:  54 seconds Number of Acquired Images:  Fluoro only. COMPARISON:  Postoperative upper GI series 01/23/2016. FINDINGS: Study was performed in the LPO and right lateral decubitus positions. The patient swallowed the barium without difficulty. There is mild presbyesophagus. No evidence of food impaction, mucosal ulceration or stricture. Contrast empties into the gastric pouch and proximal small bowel. No evidence of contrast leak. No laryngeal penetration observed. IMPRESSION: No evidence of food impaction, obstruction or extravasation. Presbyesophagus. Electronically Signed   By: Richardean Sale M.D.   On: 02/04/2016 08:50   Dg Chest Port 1 View  02/06/2016  CLINICAL DATA:  Coughing.  Fever and rule out pneumonia. EXAM: PORTABLE CHEST 1 VIEW COMPARISON:  02/03/2016.  PET-CT 12/01/2015 FINDINGS: Stable position of the right dual chamber cardiac pacemaker. New or increased densities at the left lung base. Right lung remains clear. Heart size is grossly stable and within normal limits. Atherosclerotic calcifications at the aortic arch. No acute bone abnormality. IMPRESSION: Increased densities at the left lung base are suggestive for pleural fluid and consolidation. Electronically Signed   By: Markus Daft M.D.   On: 02/06/2016 11:45   Dg Abd Acute W/chest  02/03/2016  CLINICAL DATA:  Vomiting after stomach  surgery 01/15/2016. Partial gastrectomy for cancer. EXAM: DG ABDOMEN ACUTE W/ 1V CHEST COMPARISON:  11/12/2015 abdominal CT FINDINGS: Sutures in the left upper quadrant correlating with partial gastrectomy. Study is limited by gasless abdomen. No dilated loops are seen. No evidence of  pneumoperitoneum. Trace left effusion. No pneumonia or edema. No cardiomegaly. Dual-chamber pacer from the right with leads in unremarkable position. IMPRESSION: 1. Gasless abdomen, a nonspecific finding that limits sensitivity. No indication of bowel obstruction or perforation. 2. Trace left effusion. Electronically Signed   By: Monte Fantasia M.D.   On: 02/03/2016 18:21   Dg Duanne Limerick W/water Sol Cm  01/23/2016  CLINICAL DATA:  Status post partial gastrectomy.  Evaluate for leak. EXAM: WATER SOLUBLE UPPER GI SERIES TECHNIQUE: Single-column upper GI series was performed using water soluble contrast. CONTRAST:  1 ISOVUE-300 IOPAMIDOL (ISOVUE-300) INJECTION 61% COMPARISON:  01/18/2016 FLUOROSCOPY TIME:  Radiation Exposure Index (as provided by the fluoroscopic device): Not available If the device does not provide the exposure index: Fluoroscopy Time (in minutes and seconds):  1 minutes 18 seconds Number of Acquired Images:  1, the rest saved fluoroscopy FINDINGS: Scout images over the chest epigastrium were obtained. Patient had to take swallows LPO. In this position there was no emptying of the remnant stomach with frequent large volume gastroesophageal reflux. Edema and obstruction at the gastro enterostomy with expected, but when supine and RPO the stomach drained readily. Negative for leak. The duodenal C-loop opacified and is unremarkable. IMPRESSION: 1. Negative for postoperative leak or obstruction. 2. Positional drainage of the remnant stomach. When LPO there was no drainage and large volume, symptomatic gastroesophageal reflux. 3. Distal esophagitis related to #2 or recent NG tube. Electronically Signed   By: Monte Fantasia M.D.   On: 01/23/2016 09:49   Dg Duanne Limerick W/water Sol Cm  01/18/2016  CLINICAL DATA:  82 year old female status post partial gastrectomy. Evaluate for potential postoperative leak or obstruction. EXAM: WATER SOLUBLE UPPER GI SERIES TECHNIQUE: Single-column upper GI series was performed using  water soluble contrast. CONTRAST:  60 mL of Gastrografin COMPARISON:  No priors. FLUOROSCOPY TIME:  If the device does not provide the exposure index: Fluoroscopy Time (in minutes and seconds):  1 minutes and 18 seconds Number of Acquired Images:  5 series with multiple images. FINDINGS: Initial image demonstrated a nasogastric a tube in place with tip in the gastric remnant and side-port in the distal esophagus. Midline skin staples are noted. Pacemaker leads extending over the expected location of the right atrial appendage and right ventricular apex. Gastrografin was infused via the patient's indwelling NG tube, which demonstrated a small gastric remnant. There was gastroesophageal reflux into the esophagus throughout the examination. Contrast readily traversed the pylorus into the proximal small bowel. There was no extravasation of contrast material at any point during the examination. IMPRESSION: 1. Expected postoperative appearance of the stomach following partial gastrectomy, without evidence of postoperative leak or obstruction. These results will be called to the ordering clinician or representative by the Radiologist Assistant, and communication documented in the PACS or zVision Dashboard. Electronically Signed   By: [name] M.D.   On: 01/18/2016 15:59    Time Spent in minutes  25   Louellen Molder M.D on 02/06/2016 at 2:45 PM  Between 7am to 7pm - Pager - [phone]  After 7pm go to [email or web address] - password [email or web address]  [facility] -  Office  [phone]

## 2016-02-06 NOTE — Progress Notes (Signed)
PARENTERAL NUTRITION CONSULT NOTE - INITIAL  Pharmacy Consult for TPN Indication: Intolerance to enteral feeding   Patient Measurements: Height: 5\' 1"  (154.9 cm) Weight: 134 lb 0.6 oz (60.8 kg) IBW/kg (Calculated) : 47.8  Medical History: Past Medical History  Diagnosis Date  . PAF (paroxysmal atrial fibrillation) ([place])     controlled with amiodarone, on coumadin  . CAD (coronary artery disease)   . Aortic root dilatation ([place])   . Moderate aortic stenosis   . Chronic anticoagulation   . Tachycardia-bradycardia syndrome ([place])     s/p PPM by Dr Doreatha Lew (MDT) 07/03/10  . GERD (gastroesophageal reflux disease)   . Pacemaker     2012  . Neuropathy ([place])     feet/legs  . Restless leg syndrome   . Headache(784.0)     hx migraines - takes Zoloft for migraines  . History of uterine cancer 1980s    treated with hysterectomy, external radiation and radiation seed implants.   . Cancer ([place])     hx Kidney cancer / hx of endometrial cancer  . Anxiety   . Heart murmur   . HTN (hypertension)   . Hyperlipemia   . Hypothyroidism   . Osteomyelitis ([place]) as child  . Family history of anesthesia complication     daughter has ponv  . Syncope 05/28/2014  . Elevated LFTs     hepatic steatosis  . Gastroparesis   . Anal fissure   . Radiation proctitis   . Neuromuscular disorder (HCC)     neuropathy in right leg  . Adenocarcinoma of stomach ([place]) 11/11/2015    gastric mass on egd  . PONV (postoperative nausea and vomiting)     30 yrs ago since then no problem  . CHF (congestive heart failure) ([place])   . Pneumonia     hx  . Chronic kidney disease     only has one kidney rt lft rem 03 ca  . 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 Anemia     hx 3/17 iron infusion, on aranesp and injected B12 since 11/2015.   . Stroke ([place]) 15    partial stroke left legally blind  . Nausea 01/2016  . Transfusion of blood product refused for religious reason     Insulin Requirements in the past 24 hours:   n/a  Assessment: 82 yo F presents with intractable nausea and vomiting. Recent diagnosis of adenocarcinoma of the stomach and is s/p gastrectomy earlier this month. Per family the patient has struggled with poor intake for the past week and a half and has now been unable to keep anything down for the past 2 days. Reports about a 10 lb weight loss over last 1-2 weeks.  GI: Albumin low at 2.8, prealbumin ok at 18.1. PPI and H2 blocker Endo: AM glucose controlled. No hx of DM. Lytes: wnl, K slightly low at 3.5, Mg low at 1.6 Renal: SCr stable, CrCl ~46ml/min. UOP low at 0.39ml/kg/hr. 1/2NS with KCl 58mEq at 168ml/hr Pulm: RA Cards: BP elevated and HR ok Hepatobil: LFTs wnl, TBili 0.9. Trig wnl. Neuro: GCS 15 ID: Afebrile, WBC wnl.  Best Practices:  SCDs TPN Access: 6/2? > TPN start date:  6/2 >  Nutritional Goals: per RD recs on 5/31 Kcal: 1600-1800 Protein: 75-90 g  Current Nutrition:  Bariatric clear liquid  Plan:  PICC to be placed today  Start Clinimix E 5/15 at 70ml/hr tonight Start 20% lipid emulsion at 83ml/hr Decrease 1/2NS w/ KCl 12mEq/L to 60 ml/hr tonight Add  MVI and TE in TPN Add famotidine 20mg  to TPN and stop IV famotidine Add sensitive SSI Q6H and adjust as needed Monitor TPN labs, Bmet, Mg F/U advancement of diet  Give IV Mg 2g today  Elenor Quinones, PharmD, [facility] 352 Clinical Pharmacist Pager [phone] 02/06/2016 8:02 AM

## 2016-02-06 NOTE — Progress Notes (Signed)
Pt. resting quietly. Remains asymptomatic from increased heart rate. Denies pain, feelings of palpations and nausea.

## 2016-02-06 NOTE — Progress Notes (Signed)
Daily Rounding Note  02/06/2016, 11:57 AM    SUBJECTIVE:       Fever to 101.2, pulse up to 130s late last night.  Both now resolved.  Blood clx sent. Pleural fluid and consolidation on xray today.  Still spitting up even just with clears, so Dr Barry Dienes is to start TNA    OBJECTIVE:         Vital signs in last 24 hours:    Temp:  [97.7 F (36.5 C)-101.2 F (38.4 C)] 98.8 F (37.1 C) (06/02 0551) Pulse Rate:  [86-134] 86 (06/02 0551) Resp:  [19-24] 19 (06/02 0551) BP: (114-186)/(53-114) 161/90 mmHg (06/02 0551) SpO2:  [96 %-99 %] 99 % (06/02 0551) Last BM Date: 02/04/16 Filed Weights   02/03/16 1634 02/03/16 2100  Weight: 59.875 kg (132 lb) 60.8 kg (134 lb 0.6 oz)   General: looks better   Heart: RRR Chest: clear bil.  No cough.  No dyspnea Abdomen: soft, NT, ND.  BS present.     Extremities: no CCE. Neuro/Psych:  Pleasant, oriented x 3.  Alert, calm, pleasant.    Intake/Output from previous day: 06/01 0701 - 06/02 0700 In: 580 [P.O.:30; I.V.:550] Out: 650 [Urine:650]  Intake/Output this shift: Total I/O In: 30 [P.O.:30] Out: 150 [Urine:150]  Lab Results:  Recent Labs  02/04/16 0454 02/05/16 0245 02/06/16 0602  WBC 8.4 8.4 6.7  HGB 12.6 12.1 11.1*  HCT 40.6 39.2 36.6  PLT 252 212 186   BMET  Recent Labs  02/04/16 0454 02/05/16 0245 02/06/16 0602  NA 148* 146* 142  K 3.4* 3.5 3.5  CL 117* 118* 113*  CO2 22 18* 20*  GLUCOSE 133* 103* 88  BUN 17 15 10   CREATININE 1.00 0.98 0.98  CALCIUM 9.4 9.1 8.9   LFT  Recent Labs  02/04/16 0454 02/05/16 0245 02/06/16 0602  PROT 6.5 5.8* 5.5*  ALBUMIN 3.3* 3.1* 2.8*  AST 20 24 23   ALT 13* 13* 13*  ALKPHOS 83 80 74  BILITOT 0.7 0.6 0.9   PT/INR  Recent Labs  02/05/16 0245 02/06/16 0602  LABPROT 21.8* 16.8*  INR 1.91* 1.36    Studies/Results: Dg Chest Port 1 View  02/06/2016  CLINICAL DATA:  Coughing.  Fever and rule out pneumonia.  EXAM: PORTABLE CHEST 1 VIEW COMPARISON:  02/03/2016.  PET-CT 12/01/2015 FINDINGS: Stable position of the right dual chamber cardiac pacemaker. New or increased densities at the left lung base. Right lung remains clear. Heart size is grossly stable and within normal limits. Atherosclerotic calcifications at the aortic arch. No acute bone abnormality. IMPRESSION: Increased densities at the left lung base are suggestive for pleural fluid and consolidation. Electronically Signed   By: Markus Daft M.D.   On: 02/06/2016 11:45    Scheduled Meds: . cefTRIAXone (ROCEPHIN)  IV  1 g Intravenous Q24H  . fentaNYL (SUBLIMAZE) injection  25 mcg Intravenous Once  . [START ON 02/07/2016] insulin aspart  0-9 Units Subcutaneous Q6H  . levothyroxine  25 mcg Intravenous QAC breakfast  . magnesium sulfate 1 - 4 g bolus IVPB  2 g Intravenous Once  . metoCLOPramide (REGLAN) injection  5 mg Intravenous TID AC & HS  . metoprolol  5 mg Intravenous Q6H  . pantoprazole (PROTONIX) IV  40 mg Intravenous Q12H  . sodium chloride flush  3 mL Intravenous Q12H   Continuous Infusions: . 0.45 % NaCl with KCl 20 mEq / L 100 mL/hr at 02/06/16 0505  .  0.45 % NaCl with KCl 20 mEq / L    . Marland KitchenTPN (CLINIMIX-E) Adult     And  . fat emulsion     PRN Meds:.acetaminophen, diphenhydrAMINE, hydrALAZINE, LORazepam, prochlorperazine   ASSESMENT:   * Gastric adenocarcinoma. S/p 01/15/16 subtotal gastrectomy. Delayed gastric emptying/gastroparesis on 2008 emptying study.  Persistent dysphagia, regurgitation since surgery, now with 2 days of n/v. Xray and contrast studies reveal only mild presbyesophagus.  Note TNTC WBCs, + nitrites on U/A. Not yet on abx. Treated with Amoxil for lower growth enterococcal UTI in 11/2015.  6/1 EGD: grade D esophagitis.  Protonix upped to BID, Reglan added.    * PAF, hx retinal artery occlusion. On chronic Coumadin, none since admission. INR 3.2.   * Anemia. Started on parenteral iron, Aranesp and  B12 injections during 11/2015 admission   *  Pyuria. UTI.  On Rocephin. Urine clx pending.   *  Fever and tachycardia.  Pulmonary fluid, and consolidation on xray.    PLAN   *  Continue current PPI, reglan.  TNA to start, once PICC placed.    Azucena Freed  02/06/2016, 11:57 AM Pager: [phone]     Attending physician's note   I have taken an interval history, reviewed the chart and examined the patient. I agree with the Advanced Practitioner's note, impression and recommendations. S/P subtotal gastrectomy for T2, N0 gastric adenocarcinoma on 01/15/2016. EGD on 02/05/2016 showed 2 small anastomotic ulcers and LA class D erosive esophagitis. UTI on Rocephin. TNA for nutritional support per [name]. Continue PPI bid, Reglan ac and hs. Trial of clears when approved by [name]. We will follow up on Monday. Please call for questions/concerns over the weekend.   Lucio Edward, MD [facility] [phone] Mon-Fri 8a-5p [phone] after 5p, weekends, holidays

## 2016-02-06 NOTE — Consult Note (Addendum)
ANTICOAGULATION CONSULT NOTE - Initial Consult  Pharmacy Consult for Coumadin --> Heparin Indication: atrial fibrillation  Allergies  Allergen Reactions  . Amiodarone      Thyroid and liver and kidney problems  . Codeine Nausea And Vomiting  . Gabapentin Itching  . Hydrocodone Other (See Comments)    hallucination  . Morphine And Related Nausea And Vomiting  . Requip [Ropinirole Hcl]     Headache   . Zinc     nausea    Patient Measurements: Height: 5\' 1"  (154.9 cm) Weight: 134 lb 0.6 oz (60.8 kg) IBW/kg (Calculated) : 47.8  Vital Signs: Temp: 98.7 F (37.1 C) (06/02 1426) Temp Source: Oral (06/02 1426) BP: 184/83 mmHg (06/02 1426) Pulse Rate: 86 (06/02 1426)  Labs:  Recent Labs  02/04/16 0454 02/05/16 0245 02/06/16 0602  HGB 12.6 12.1 11.1*  HCT 40.6 39.2 36.6  PLT 252 212 186  LABPROT 32.3* 21.8* 16.8*  INR 3.22* 1.91* 1.36  CREATININE 1.00 0.98 0.98    Estimated Creatinine Clearance: 38.9 mL/min (by C-G formula based on Cr of 0.98).   Medical History: Past Medical History  Diagnosis Date  . PAF (paroxysmal atrial fibrillation) ([place])     controlled with amiodarone, on coumadin  . CAD (coronary artery disease)   . Aortic root dilatation ([place])   . Moderate aortic stenosis   . Chronic anticoagulation   . Tachycardia-bradycardia syndrome ([place])     s/p PPM by Dr Doreatha Lew (MDT) 07/03/10  . GERD (gastroesophageal reflux disease)   . Pacemaker     2012  . Neuropathy ([place])     feet/legs  . Restless leg syndrome   . Headache(784.0)     hx migraines - takes Zoloft for migraines  . History of uterine cancer 1980s    treated with hysterectomy, external radiation and radiation seed implants.   . Cancer ([place])     hx Kidney cancer / hx of endometrial cancer  . Anxiety   . Heart murmur   . HTN (hypertension)   . Hyperlipemia   . Hypothyroidism   . Osteomyelitis ([place]) as child  . Family history of anesthesia complication     daughter has ponv  . Syncope  05/28/2014  . Elevated LFTs     hepatic steatosis  . Gastroparesis   . Anal fissure   . Radiation proctitis   . Neuromuscular disorder (HCC)     neuropathy in right leg  . Adenocarcinoma of stomach ([place]) 11/11/2015    gastric mass on egd  . PONV (postoperative nausea and vomiting)     30 yrs ago since then no problem  . CHF (congestive heart failure) ([place])   . Pneumonia     hx  . Chronic kidney disease     only has one kidney rt lft rem 03 ca  . 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 Anemia     hx 3/17 iron infusion, on aranesp and injected B12 since 11/2015.   . Stroke ([place]) 15    partial stroke left legally blind  . Nausea 01/2016  . Transfusion of blood product refused for religious reason    Assessment: 81yof on coumadin pta for afib, admitted 5/30 with N/V. INR 2.84 on admission and coumadin held. INR rose to 3.22 the next day and 5mg  vitamin k was given to reverse INR for EGD. EGD done yesterday showed gastritis. Today's INR is down to 1.36 and per GI recommendations, ok to resume coumadin. May require a few higher doses to  overcome vitamin k resistance.  Home dose: 2mg  daily  Goal of Therapy:  INR 2-3 Monitor platelets by anticoagulation protocol: Yes   Plan:  1) Coumadin 4mg  x 1 tonight 2) Daily INR  Deboraha Sprang 02/06/2016,5:54 PM   Addendum: Patient not tolerating PO so coumadin stopped and pharmacy asked to begin IV heparin.  Plan: 1) Begin heparin at 950 units with no bolus 2) Daily heparin level and CBC  Deboraha Sprang 02/06/2016, 8:49 PM

## 2016-02-07 ENCOUNTER — Other Ambulatory Visit: Payer: Self-pay | Admitting: Internal Medicine

## 2016-02-07 DIAGNOSIS — I4891 Unspecified atrial fibrillation: Secondary | ICD-10-CM

## 2016-02-07 DIAGNOSIS — K259 Gastric ulcer, unspecified as acute or chronic, without hemorrhage or perforation: Secondary | ICD-10-CM

## 2016-02-07 LAB — CBC
HEMATOCRIT: 35.8 % — AB (ref 36.0–46.0)
HEMOGLOBIN: 11.3 g/dL — AB (ref 12.0–15.0)
MCH: 28.6 pg (ref 26.0–34.0)
MCHC: 31.6 g/dL (ref 30.0–36.0)
MCV: 90.6 fL (ref 78.0–100.0)
Platelets: 184 10*3/uL (ref 150–400)
RBC: 3.95 MIL/uL (ref 3.87–5.11)
RDW: 16.8 % — ABNORMAL HIGH (ref 11.5–15.5)
WBC: 8.5 10*3/uL (ref 4.0–10.5)

## 2016-02-07 LAB — URINE CULTURE
Culture: 50000 — AB
Special Requests: NORMAL

## 2016-02-07 LAB — BASIC METABOLIC PANEL
ANION GAP: 10 (ref 5–15)
BUN: 8 mg/dL (ref 6–20)
CHLORIDE: 107 mmol/L (ref 101–111)
CO2: 21 mmol/L — AB (ref 22–32)
Calcium: 8.7 mg/dL — ABNORMAL LOW (ref 8.9–10.3)
Creatinine, Ser: 0.84 mg/dL (ref 0.44–1.00)
GFR calc non Af Amer: >60 mL/min (ref >60)
Glucose, Bld: 118 mg/dL — ABNORMAL HIGH (ref 65–99)
Potassium: 2.9 mmol/L — ABNORMAL LOW (ref 3.5–5.1)
Sodium: 138 mmol/L (ref 135–145)

## 2016-02-07 LAB — C DIFFICILE QUICK SCREEN W PCR REFLEX
C DIFFICILE (CDIFF) TOXIN: NEGATIVE
C DIFFICLE (CDIFF) ANTIGEN: NEGATIVE
C Diff interpretation: NEGATIVE

## 2016-02-07 LAB — GLUCOSE, CAPILLARY
GLUCOSE-CAPILLARY: 124 mg/dL — AB (ref 65–99)
Glucose-Capillary: 124 mg/dL — ABNORMAL HIGH (ref 65–99)
Glucose-Capillary: 95 mg/dL (ref 65–99)
Glucose-Capillary: 101 mg/dL — ABNORMAL HIGH (ref 65–99)

## 2016-02-07 LAB — PROTIME-INR
INR: 1.34 (ref 0.00–1.49)
Prothrombin Time: 16.7 seconds — ABNORMAL HIGH (ref 11.6–15.2)

## 2016-02-07 LAB — MAGNESIUM: Magnesium: 1.8 mg/dL (ref 1.7–2.4)

## 2016-02-07 LAB — HEPARIN LEVEL (UNFRACTIONATED)
HEPARIN UNFRACTIONATED: 0.1 [IU]/mL — AB (ref 0.30–0.70)
Heparin Unfractionated: 0.66 IU/mL (ref 0.30–0.70)

## 2016-02-07 MED ORDER — POTASSIUM CHLORIDE 10 MEQ/100ML IV SOLN
INTRAVENOUS | Status: AC
  Filled 2016-02-07: qty 100

## 2016-02-07 MED ORDER — MAGNESIUM SULFATE 2 GM/50ML IV SOLN
2.0000 g | Freq: Once | INTRAVENOUS | Status: AC
  Administered 2016-02-07: 2 g via INTRAVENOUS
  Filled 2016-02-07: qty 50

## 2016-02-07 MED ORDER — DILTIAZEM HCL 100 MG IV SOLR
5.0000 mg/h | INTRAVENOUS | Status: DC
  Administered 2016-02-08: 5 mg/h via INTRAVENOUS
  Administered 2016-02-08: 10 mg/h via INTRAVENOUS
  Filled 2016-02-07 (×2): qty 100

## 2016-02-07 MED ORDER — FAT EMULSION 20 % IV EMUL
240.0000 mL | INTRAVENOUS | Status: AC
  Administered 2016-02-07: 240 mL via INTRAVENOUS
  Filled 2016-02-07: qty 250

## 2016-02-07 MED ORDER — METHOCARBAMOL 1000 MG/10ML IJ SOLN
500.0000 mg | Freq: Two times a day (BID) | INTRAVENOUS | Status: DC
  Administered 2016-02-07 – 2016-02-08 (×3): 500 mg via INTRAVENOUS
  Filled 2016-02-07 (×7): qty 5

## 2016-02-07 MED ORDER — TRACE MINERALS CR-CU-MN-SE-ZN 10-1000-500-60 MCG/ML IV SOLN
INTRAVENOUS | Status: AC
  Administered 2016-02-07: 18:00:00 via INTRAVENOUS
  Filled 2016-02-07: qty 960

## 2016-02-07 MED ORDER — POTASSIUM CHLORIDE 10 MEQ/100ML IV SOLN
10.0000 meq | INTRAVENOUS | Status: AC
  Administered 2016-02-07 (×6): 10 meq via INTRAVENOUS
  Filled 2016-02-07 (×7): qty 100

## 2016-02-07 MED ORDER — LEVOFLOXACIN IN D5W 750 MG/150ML IV SOLN
750.0000 mg | INTRAVENOUS | Status: DC
  Administered 2016-02-07 – 2016-02-11 (×3): 750 mg via INTRAVENOUS
  Filled 2016-02-07 (×3): qty 150

## 2016-02-07 MED ORDER — FLUCONAZOLE IN SODIUM CHLORIDE 100-0.9 MG/50ML-% IV SOLN
100.0000 mg | INTRAVENOUS | Status: DC
  Administered 2016-02-07 – 2016-02-13 (×7): 100 mg via INTRAVENOUS
  Filled 2016-02-07 (×10): qty 50

## 2016-02-07 MED ORDER — DILTIAZEM HCL 25 MG/5ML IV SOLN
10.0000 mg | INTRAVENOUS | Status: DC | PRN
  Administered 2016-02-07: 10 mg via INTRAVENOUS
  Filled 2016-02-07 (×3): qty 5

## 2016-02-07 NOTE — Progress Notes (Signed)
Family states that the pt has chronic diarrhea.

## 2016-02-07 NOTE — Progress Notes (Signed)
PROGRESS NOTE                                                                                                                                                                                                             Patient Demographics:    [name], is a 82 y.o. female, DOB - 01-31-1934, RH:[id number]  Admit date - 02/03/2016   Admitting Physician Valerie Milan, MD  Outpatient Primary MD for the patient is Valerie Richards, MD  LOS -   Outpatient Specialists: [name]  Chief Complaint  Patient presents with  . Emesis       Brief Narrative   82 year old female with paroxysmal A. fib, CAD with history of CABG, moderate BS, tachycardia-bradycardia syndrome status post pacemaker, GERD, peripheral neuropathy, hypertension, hypothyroidism, CVA with left eye blindness, recently diagnosed adenocarcinoma of the stomach status post gastrectomy done earlier this month by [name] presented to the ED with nausea with multiple episodes of vomiting for 1 day.. Patient reported dysphagia as well. Reported frequent loose stools without worsening symptoms. No melena or bright red blood per rectum. Denies fever or chills, headache, shortness of breath or chest pain. Denied dysuria. Abdominal x-ray negative for obstruction. GI and surgery following.   Subjective:   Still nauseous and unable to tolerate by mouth. When tachycardic and went into rapid A. fib overnight to 130s-140s.   Assessment  & Plan :    Principal Problem:   Intractable nausea and vomiting -Barium esophagogram-- presbyesophagus with contrast passing into the proximal small bowel.  - EGD showing mid-distal third esophagitis without bleeding. Patent surgical anastomosis. Showed mid-distal reflux esophagitis, ,gastric ulcers at the gastric anastomosis with no bleeding stigmata. (Largest measuring 5 mm) -BID  PPI, Reglan 3 times a day before meals and  at bedtime. Recommend strict antireflux measures with HOB elevation at all times.  -Recommend clear liquid diet advancing slowly to bariatric diet but patient unable to tolerate -Given severe malnutrition, PICC line ordered to start TNA by [name].  Active Problems:  SIRS Pt febrile with tachycardia. CXR shows increased left lung base densities suggestive of pleural fluid and consolidation.on Rocephin and azithromycin. Urine culture growing enterococcus and Citrobacter. Will switch to Levaquin.  UTI On Rocephin. Cultures growing Enterobacter and Citrobacter. Switch to Rocephin.  Uncontrolled hypertension Blood pressure elevated..  Scheduled low pressure  and hydralazine. Added when necessary Cardizem for rate control.  A. fib with RVR Possibly due to underlying infection and GI symptoms. Ordered when necessary Cardizem with good control. Will place on Cardizem drip if persistent. Started IV heparin.  Adenocarcinoma of stomach ([name]) Status post subtotal gastrectomy recently. [name] following.    Chronic atrial fibrillation (HCC) Resume Coumadin once tolerating by mouth  Hypokalemia/hyponatremia   secondary to dehydration Replenishing with fluids    Hypothyroidism Synthroid at IV dose.  CAD with history of CABG IV metoprolol.  Oral thrush Added fluconazole.    Code Status : Full code  Family Communication  : Daughters at bedside  Disposition Plan  : Home once clinically improved.  Barriers For Discharge : Ongoing nausea and vomiting, SIRS  Consults  :   [facility]  Procedures  :  barium esophagogram EGD  DVT Prophylaxis  :  IV heparin. Resume Coumadin once able to tolerate.  Lab Results  Component Value Date   PLT 184 02/07/2016    Antibiotics  :    Anti-infectives    Start     Dose/Rate Route Frequency Ordered Stop   02/07/16 0815  fluconazole (DIFLUCAN) IVPB 100 mg     100 mg 50 mL/hr over 60 Minutes Intravenous Every 24 hours 02/07/16  0813     02/06/16 1500  azithromycin (ZITHROMAX) 500 mg in dextrose 5 % 250 mL IVPB     500 mg 250 mL/hr over 60 Minutes Intravenous Every 24 hours 02/06/16 1454     02/04/16 1700  cefTRIAXone (ROCEPHIN) 1 g in dextrose 5 % 50 mL IVPB     1 g 100 mL/hr over 30 Minutes Intravenous Every 24 hours 02/04/16 1553          Objective:   Filed Vitals:   02/06/16 0551 02/06/16 1426 02/06/16 2217 02/07/16 0452  BP: 161/90 184/83 180/94 149/84  Pulse: 86 86 94 101  Temp: 98.8 F (37.1 C) 98.7 F (37.1 C) 98.8 F (37.1 C) 98.7 F (37.1 C)  TempSrc: Oral Oral Oral Oral  Resp: 19 19 18 19   Height:      Weight:      SpO2: 99% 98% 96% 98%    Wt Readings from Last 3 Encounters:  02/03/16 60.8 kg (134 lb 0.6 oz)  01/17/16 73.846 kg (162 lb 12.8 oz)  01/07/16 65.318 kg (144 lb)     Intake/Output Summary (Last 24 hours) at 02/07/16 1417 Last data filed at 02/07/16 1345  Gross per 24 hour  Intake 1082.09 ml  Output   1401 ml  Net -318.91 ml     Physical Exam  Gen: Appears fatigued HEENT: no pallor, dry mucosa, supple neck Chest: clear b/l, no added sounds CVS: S1 and S2 irregular regular, no murmurs rubs or gallop GI: soft, NT, ND, BS+ Musculoskeletal: warm, no edema     Data Review:    CBC  Recent Labs Lab 02/03/16 1655 02/04/16 0454 02/05/16 0245 02/06/16 0602 02/07/16 0520  WBC 8.3 8.4 8.4 6.7 8.5  HGB 13.4 12.6 12.1 11.1* 11.3*  HCT 42.6 40.6 39.2 36.6 35.8*  PLT 261 252 212 186 184  MCV 92.4 92.7 92.7 94.3 90.6  MCH 29.1 28.8 28.6 28.6 28.6  MCHC 31.5 31.0 30.9 30.3 31.6  RDW 17.8* 17.7* 17.8* 17.6* 16.8*  LYMPHSABS  --  1.3 1.9 1.5  --   MONOABS  --  0.7 0.5 0.6  --   EOSABS  --  0.2 0.4 0.4  --  BASOSABS  --  0.1 0.1 0.0  --     Chemistries   Recent Labs Lab 02/03/16 1655 02/04/16 0454 02/05/16 0245 02/06/16 0602 02/07/16 0520  NA 147* 148* 146* 142 138  K 3.4* 3.4* 3.5 3.5 2.9*  CL 115* 117* 118* 113* 107  CO2 21* 22 18* 20* 21*    GLUCOSE 126* 133* 103* 88 118*  BUN 18 17 15 10 8   CREATININE 1.06* 1.00 0.98 0.98 0.84  CALCIUM 9.5 9.4 9.1 8.9 8.7*  MG 1.7  --   --  1.6* 1.8  AST 19 20 24 23   --   ALT 12* 13* 13* 13*  --   ALKPHOS 90 83 80 74  --   BILITOT 0.7 0.7 0.6 0.9  --    ------------------------------------------------------------------------------------------------------------------  Recent Labs  02/06/16 0602  TRIG 132    Lab Results  Component Value Date   HGBA1C 5.7* 10/09/2015   ------------------------------------------------------------------------------------------------------------------ No results for input(s): TSH, T4TOTAL, T3FREE, THYROIDAB in the last 72 hours.  Invalid input(s): FREET3 ------------------------------------------------------------------------------------------------------------------ No results for input(s): VITAMINB12, FOLATE, FERRITIN, TIBC, IRON, RETICCTPCT in the last 72 hours.  Coagulation profile  Recent Labs Lab 02/03/16 1655 02/04/16 0454 02/05/16 0245 02/06/16 0602 02/07/16 0520  INR 2.84* 3.22* 1.91* 1.36 1.34    No results for input(s): DDIMER in the last 72 hours.  Cardiac Enzymes No results for input(s): CKMB, TROPONINI, MYOGLOBIN in the last 168 hours.  Invalid input(s): CK ------------------------------------------------------------------------------------------------------------------ No results found for: BNP  Inpatient Medications  Scheduled Meds: . azithromycin  500 mg Intravenous Q24H  . cefTRIAXone (ROCEPHIN)  IV  1 g Intravenous Q24H  . fentaNYL (SUBLIMAZE) injection  25 mcg Intravenous Once  . fluconazole (DIFLUCAN) IV  100 mg Intravenous Q24H  . insulin aspart  0-9 Units Subcutaneous Q6H  . levothyroxine  25 mcg Intravenous QAC breakfast  . methocarbamol (ROBAXIN)  IV  500 mg Intravenous Q12H  . metoCLOPramide (REGLAN) injection  5 mg Intravenous TID AC & HS  . metoprolol  5 mg Intravenous Q6H  . sodium chloride flush  3  mL Intravenous Q12H   Continuous Infusions: . 0.45 % NaCl with KCl 20 mEq / L 60 mL/hr at 02/06/16 1707  . Marland KitchenTPN (CLINIMIX-E) Adult 40 mL/hr at 02/06/16 1754   And  . fat emulsion 240 mL (02/06/16 1754)  . Marland KitchenTPN (CLINIMIX-E) Adult     And  . fat emulsion    . heparin 1,200 Units/hr (02/07/16 0615)   PRN Meds:.acetaminophen, diltiazem, diphenhydrAMINE, hydrALAZINE, prochlorperazine, sodium chloride flush  Micro Results Recent Results (from the past 240 hour(s))  Urine culture     Status: Abnormal   Collection Time: 02/04/16  6:35 PM  Result Value Ref Range Status   Specimen Description URINE, CLEAN CATCH  Final   Special Requests Normal  Final   Culture (A)  Final    50,000 COLONIES/mL ENTEROCOCCUS SPECIES 50,000 COLONIES/mL CITROBACTER FREUNDII    Report Status 02/07/2016 FINAL  Final   Organism ID, Bacteria ENTEROCOCCUS SPECIES (A)  Final   Organism ID, Bacteria CITROBACTER FREUNDII (A)  Final      Susceptibility   Citrobacter freundii - MIC*    CEFAZOLIN >=64 RESISTANT Resistant     CEFTRIAXONE <=1 SENSITIVE Sensitive     CIPROFLOXACIN <=0.25 SENSITIVE Sensitive     GENTAMICIN <=1 SENSITIVE Sensitive     IMIPENEM 0.5 SENSITIVE Sensitive     NITROFURANTOIN <=16 SENSITIVE Sensitive     TRIMETH/SULFA <=20 SENSITIVE Sensitive  PIP/TAZO <=4 SENSITIVE Sensitive     * 50,000 COLONIES/mL CITROBACTER FREUNDII   Enterococcus species - MIC*    AMPICILLIN <=2 SENSITIVE Sensitive     LEVOFLOXACIN 1 SENSITIVE Sensitive     NITROFURANTOIN <=16 SENSITIVE Sensitive     VANCOMYCIN 1 SENSITIVE Sensitive     * 50,000 COLONIES/mL ENTEROCOCCUS SPECIES  Culture, blood (routine x 2)     Status: None (Preliminary result)   Collection Time: 02/06/16  8:15 AM  Result Value Ref Range Status   Specimen Description BLOOD RIGHT ANTECUBITAL  Final   Special Requests BOTTLES DRAWN AEROBIC ONLY 8CC  Final   Culture NO GROWTH < 24 HOURS  Final   Report Status PENDING  Incomplete  Culture, blood  (routine x 2)     Status: None (Preliminary result)   Collection Time: 02/06/16  8:25 AM  Result Value Ref Range Status   Specimen Description BLOOD LEFT HAND  Final   Special Requests IN PEDIATRIC BOTTLE 3CC  Final   Culture NO GROWTH < 24 HOURS  Final   Report Status PENDING  Incomplete    Radiology Reports Dg Esophagus  02/04/2016  CLINICAL DATA:  Vomiting for 2 days 3 weeks status post partial gastrectomy. Evaluate for food impaction. EXAM: ESOPHOGRAM/BARIUM SWALLOW TECHNIQUE: Single contrast examination was performed using  thin barium. FLUOROSCOPY TIME:  Radiation Exposure Index (as provided by the fluoroscopic device): 238.9 uGy*m2 If the device does not provide the exposure index: Fluoroscopy Time:  54 seconds Number of Acquired Images:  Fluoro only. COMPARISON:  Postoperative upper GI series 01/23/2016. FINDINGS: Study was performed in the LPO and right lateral decubitus positions. The patient swallowed the barium without difficulty. There is mild presbyesophagus. No evidence of food impaction, mucosal ulceration or stricture. Contrast empties into the gastric pouch and proximal small bowel. No evidence of contrast leak. No laryngeal penetration observed. IMPRESSION: No evidence of food impaction, obstruction or extravasation. Presbyesophagus. Electronically Signed   By: Richardean Sale M.D.   On: 02/04/2016 08:50   Dg Chest Port 1 View  02/06/2016  CLINICAL DATA:  PICC line placement EXAM: PORTABLE CHEST 1 VIEW COMPARISON:  02/06/2015 FINDINGS: LEFT-sided PICC line placed with tip distal SVC. Stable cardiac silhouette. Small LEFT effusion. Mild central venous pulmonary congestion. No pneumothorax IMPRESSION: 1. PICC line in proper position. 2. LEFT effusion insert venous congestion. Electronically Signed   By: Suzy Bouchard M.D.   On: 02/06/2016 16:57   Dg Chest Port 1 View  02/06/2016  CLINICAL DATA:  Coughing.  Fever and rule out pneumonia. EXAM: PORTABLE CHEST 1 VIEW COMPARISON:   02/03/2016.  PET-CT 12/01/2015 FINDINGS: Stable position of the right dual chamber cardiac pacemaker. New or increased densities at the left lung base. Right lung remains clear. Heart size is grossly stable and within normal limits. Atherosclerotic calcifications at the aortic arch. No acute bone abnormality. IMPRESSION: Increased densities at the left lung base are suggestive for pleural fluid and consolidation. Electronically Signed   By: Markus Daft M.D.   On: 02/06/2016 11:45   Dg Abd Acute W/chest  02/03/2016  CLINICAL DATA:  Vomiting after stomach surgery 01/15/2016. Partial gastrectomy for cancer. EXAM: DG ABDOMEN ACUTE W/ 1V CHEST COMPARISON:  11/12/2015 abdominal CT FINDINGS: Sutures in the left upper quadrant correlating with partial gastrectomy. Study is limited by gasless abdomen. No dilated loops are seen. No evidence of pneumoperitoneum. Trace left effusion. No pneumonia or edema. No cardiomegaly. Dual-chamber pacer from the right with leads in unremarkable position.  IMPRESSION: 1. Gasless abdomen, a nonspecific finding that limits sensitivity. No indication of bowel obstruction or perforation. 2. Trace left effusion. Electronically Signed   By: Monte Fantasia M.D.   On: 02/03/2016 18:21   Dg Duanne Limerick W/water Sol Cm  01/23/2016  CLINICAL DATA:  Status post partial gastrectomy.  Evaluate for leak. EXAM: WATER SOLUBLE UPPER GI SERIES TECHNIQUE: Single-column upper GI series was performed using water soluble contrast. CONTRAST:  1 ISOVUE-300 IOPAMIDOL (ISOVUE-300) INJECTION 61% COMPARISON:  01/18/2016 FLUOROSCOPY TIME:  Radiation Exposure Index (as provided by the fluoroscopic device): Not available If the device does not provide the exposure index: Fluoroscopy Time (in minutes and seconds):  1 minutes 18 seconds Number of Acquired Images:  1, the rest saved fluoroscopy FINDINGS: Scout images over the chest epigastrium were obtained. Patient had to take swallows LPO. In this position there was no  emptying of the remnant stomach with frequent large volume gastroesophageal reflux. Edema and obstruction at the gastro enterostomy with expected, but when supine and RPO the stomach drained readily. Negative for leak. The duodenal C-loop opacified and is unremarkable. IMPRESSION: 1. Negative for postoperative leak or obstruction. 2. Positional drainage of the remnant stomach. When LPO there was no drainage and large volume, symptomatic gastroesophageal reflux. 3. Distal esophagitis related to #2 or recent NG tube. Electronically Signed   By: Monte Fantasia M.D.   On: 01/23/2016 09:49   Dg Duanne Limerick W/water Sol Cm  01/18/2016  CLINICAL DATA:  82 year old female status post partial gastrectomy. Evaluate for potential postoperative leak or obstruction. EXAM: WATER SOLUBLE UPPER GI SERIES TECHNIQUE: Single-column upper GI series was performed using water soluble contrast. CONTRAST:  60 mL of Gastrografin COMPARISON:  No priors. FLUOROSCOPY TIME:  If the device does not provide the exposure index: Fluoroscopy Time (in minutes and seconds):  1 minutes and 18 seconds Number of Acquired Images:  5 series with multiple images. FINDINGS: Initial image demonstrated a nasogastric a tube in place with tip in the gastric remnant and side-port in the distal esophagus. Midline skin staples are noted. Pacemaker leads extending over the expected location of the right atrial appendage and right ventricular apex. Gastrografin was infused via the patient's indwelling NG tube, which demonstrated a small gastric remnant. There was gastroesophageal reflux into the esophagus throughout the examination. Contrast readily traversed the pylorus into the proximal small bowel. There was no extravasation of contrast material at any point during the examination. IMPRESSION: 1. Expected postoperative appearance of the stomach following partial gastrectomy, without evidence of postoperative leak or obstruction. These results will be called to the  ordering clinician or representative by the Radiologist Assistant, and communication documented in the PACS or zVision Dashboard. Electronically Signed   By: [name] M.D.   On: 01/18/2016 15:59    Time Spent in minutes  25   Louellen Molder M.D on 02/07/2016 at 2:17 PM  Between 7am to 7pm - Pager - [phone]  After 7pm go to [email or web address] - password [facility]  [facility] -  Office  [phone]

## 2016-02-07 NOTE — Progress Notes (Signed)
PARENTERAL NUTRITION CONSULT NOTE - INITIAL  Pharmacy Consult for TPN Indication: Intolerance to enteral feeding   Patient Measurements: Height: 5\' 1"  (154.9 cm) Weight: 134 lb 0.6 oz (60.8 kg) IBW/kg (Calculated) : 47.8  Medical History: Past Medical History  Diagnosis Date  . PAF (paroxysmal atrial fibrillation) ([place])     controlled with amiodarone, on coumadin  . CAD (coronary artery disease)   . Aortic root dilatation ([place])   . Moderate aortic stenosis   . Chronic anticoagulation   . Tachycardia-bradycardia syndrome ([place])     s/p PPM by Dr Doreatha Lew (MDT) 07/03/10  . GERD (gastroesophageal reflux disease)   . Pacemaker     2012  . Neuropathy ([place])     feet/legs  . Restless leg syndrome   . Headache(784.0)     hx migraines - takes Zoloft for migraines  . History of uterine cancer 1980s    treated with hysterectomy, external radiation and radiation seed implants.   . Cancer ([place])     hx Kidney cancer / hx of endometrial cancer  . Anxiety   . Heart murmur   . HTN (hypertension)   . Hyperlipemia   . Hypothyroidism   . Osteomyelitis ([place]) as child  . Family history of anesthesia complication     daughter has ponv  . Syncope 05/28/2014  . Elevated LFTs     hepatic steatosis  . Gastroparesis   . Anal fissure   . Radiation proctitis   . Neuromuscular disorder (HCC)     neuropathy in right leg  . Adenocarcinoma of stomach ([place]) 11/11/2015    gastric mass on egd  . PONV (postoperative nausea and vomiting)     30 yrs ago since then no problem  . CHF (congestive heart failure) ([place])   . Pneumonia     hx  . Chronic kidney disease     only has one kidney rt lft rem 03 ca  . 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 Anemia     hx 3/17 iron infusion, on aranesp and injected B12 since 11/2015.   . Stroke ([place]) 15    partial stroke left legally blind  . Nausea 01/2016  . Transfusion of blood product refused for religious reason     Insulin Requirements in the past 24 hours:  0 units  of SSI  Assessment: 82 yo F presents with intractable nausea and vomiting. Recent diagnosis of adenocarcinoma of the stomach and is s/p gastrectomy earlier this month. Per family the patient has struggled with poor intake for the past week and a half and has now been unable to keep anything down for the past 2 days. Reports about a 10 lb weight loss over last 1-2 weeks.  GI: Albumin low at 2.8, prealbumin ok at 18.1. Surgery keeping on sips of clear liquids due to persistent nausea.  PPI and H2 blocker Endo: CBGs controlled 100-120s. No hx of DM. Lytes: wnl exc K now down to 2.9, Mg ok at 1.8 after replacement. CoCa 9.4 Renal: SCr stable, CrCl ~77ml/min. UOP ok at 0.23ml/kg/hr. 1/2NS with KCl 48mEq at 162ml/hr Pulm: RA Cards: BP elevated and HR ok Hepatobil: LFTs wnl, TBili 0.9. Trig wnl. Neuro: GCS 15 ID: Afebrile, WBC wnl.  Best Practices:  SCDs TPN Access: PICC Double Lumen 6/2 > TPN start date:  6/2 >  Nutritional Goals: per RD recs on 5/31 Kcal: 1600-1800 Protein: 75-90 g  Current Nutrition:  Bariatric clear liquid  Plan:  Continue Clinimix E 5/15 at 56ml/hr  tonight Continue 20% lipid emulsion at 36ml/hr Will plan to advance further once electrolytes ok Continue 1/2NS w/ KCl 72mEq/L to 60 ml/hr Add MVI and TE in TPN Add famotidine 20mg  to TPN Add sensitive SSI Q6H and adjust as needed Monitor TPN labs, Bmet F/U advancement of diet  Give KCl 58mEq IV x 6 runs per Surgery  Elenor Quinones, PharmD, BCPS Clinical Pharmacist Pager [phone] 02/07/2016 8:24 AM

## 2016-02-07 NOTE — Progress Notes (Signed)
Rate in the 140s with any movement.  On cozaar and tenormin at home, Lopressor here Q6, texted Dr. Clementeen Graham about this as well as pt c/o restless leg syndrome.

## 2016-02-07 NOTE — Consult Note (Signed)
[facility] for Coumadin --> Heparin Indication: atrial fibrillation  Allergies  Allergen Reactions  . Amiodarone      Thyroid and liver and kidney problems  . Codeine Nausea And Vomiting  . Gabapentin Itching  . Hydrocodone Other (See Comments)    hallucination  . Morphine And Related Nausea And Vomiting  . Requip [Ropinirole Hcl]     Headache   . Zinc     nausea    Patient Measurements: Height: 5\' 1"  (154.9 cm) Weight: 134 lb 0.6 oz (60.8 kg) IBW/kg (Calculated) : 47.8  Vital Signs: Temp: 98.7 F (37.1 C) (06/03 1500) Temp Source: Oral (06/03 1500) BP: 183/66 mmHg (06/03 1500) Pulse Rate: 103 (06/03 1500)  Labs:  Recent Labs  02/05/16 0245 02/06/16 0602 02/07/16 0520 02/07/16 1500  HGB 12.1 11.1* 11.3*  --   HCT 39.2 36.6 35.8*  --   PLT 212 186 184  --   LABPROT 21.8* 16.8* 16.7*  --   INR 1.91* 1.36 1.34  --   HEPARINUNFRC  --   --  0.10* 0.66  CREATININE 0.98 0.98 0.84  --     Estimated Creatinine Clearance: 45.4 mL/min (by C-G formula based on Cr of 0.84).   Assessment: 81yof on coumadin pta for afib, admitted 5/30 with N/V. INR 2.84 on admission now s/p INR reversal and EGD and heparin has been restarted. -Heparin level= 0.66  after increase to 1200 units/hr   Goal of Therapy:  INR 2-3 Monitor platelets by anticoagulation protocol: Yes   Plan:  -No heparin changes needed -Will confirm a heparin level later today  Hildred Laser, Pharm D 02/07/2016 4:23 PM

## 2016-02-07 NOTE — Progress Notes (Signed)
2 Days Post-Op  Subjective: No more vomiting for 24 hours.  Has really kept herself nothing by mouth for the most part. Denies pain Alert and pleasant. Biggest complaint is increasing diarrhea  Afebrile for 24 hours.  On antibiotics for UTI.  Waiting cultures.  Chest x-ray showed left lung base effusion and possible consolidation and so azithromycin was started yesterday.  On TNA.   Objective: Vital signs in last 24 hours: Temp:  [98.7 F (37.1 C)-98.8 F (37.1 C)] 98.7 F (37.1 C) (06/03 0452) Pulse Rate:  [86-101] 101 (06/03 0452) Resp:  [18-19] 19 (06/03 0452) BP: (149-184)/(83-94) 149/84 mmHg (06/03 0452) SpO2:  [96 %-98 %] 98 % (06/03 0452) Last BM Date: 02/04/16  Intake/Output from previous day: 06/02 0701 - 06/03 0700 In: 1112.1 [P.O.:230; I.V.:377.1; IV Piggyback:250; TPN:255] Out: 850 [Urine:850] Intake/Output this shift: Total I/O In: 1082.1 [P.O.:200; I.V.:377.1; IV Piggyback:250; TPN:255] Out: 200 [Urine:200]  General appearance: Alert and pleasant.  Doesn't appear to be any distress.  Appears feeble and deconditioned. Resp: Decreased breath sounds left base.  Breathing comfortably.  SPO2 98% on room air.  No rhonchi or sputum production GI: Soft.  Nontender.  Incisions well-healed.  Lab Results:   Recent Labs  02/05/16 0245 02/06/16 0602  WBC 8.4 6.7  HGB 12.1 11.1*  HCT 39.2 36.6  PLT 212 186   BMET  Recent Labs  02/06/16 0602 02/07/16 0520  NA 142 138  K 3.5 2.9*  CL 113* 107  CO2 20* 21*  GLUCOSE 88 118*  BUN 10 8  CREATININE 0.98 0.84  CALCIUM 8.9 8.7*   PT/INR  Recent Labs  02/06/16 0602 02/07/16 0520  LABPROT 16.8* 16.7*  INR 1.36 1.34   ABG No results for input(s): PHART, HCO3 in the last 72 hours.  Invalid input(s): PCO2, PO2  Studies/Results: Dg Chest Port 1 View  02/06/2016  CLINICAL DATA:  PICC line placement EXAM: PORTABLE CHEST 1 VIEW COMPARISON:  02/06/2015 FINDINGS: LEFT-sided PICC line placed with tip distal  SVC. Stable cardiac silhouette. Small LEFT effusion. Mild central venous pulmonary congestion. No pneumothorax IMPRESSION: 1. PICC line in proper position. 2. LEFT effusion insert venous congestion. Electronically Signed   By: Suzy Bouchard M.D.   On: 02/06/2016 16:57   Dg Chest Port 1 View  02/06/2016  CLINICAL DATA:  Coughing.  Fever and rule out pneumonia. EXAM: PORTABLE CHEST 1 VIEW COMPARISON:  02/03/2016.  PET-CT 12/01/2015 FINDINGS: Stable position of the right dual chamber cardiac pacemaker. New or increased densities at the left lung base. Right lung remains clear. Heart size is grossly stable and within normal limits. Atherosclerotic calcifications at the aortic arch. No acute bone abnormality. IMPRESSION: Increased densities at the left lung base are suggestive for pleural fluid and consolidation. Electronically Signed   By: Markus Daft M.D.   On: 02/06/2016 11:45    Anti-infectives: Anti-infectives    Start     Dose/Rate Route Frequency Ordered Stop   02/06/16 1500  azithromycin (ZITHROMAX) 500 mg in dextrose 5 % 250 mL IVPB     500 mg 250 mL/hr over 60 Minutes Intravenous Every 24 hours 02/06/16 1454     02/04/16 1700  cefTRIAXone (ROCEPHIN) 1 g in dextrose 5 % 50 mL IVPB     1 g 100 mL/hr over 30 Minutes Intravenous Every 24 hours 02/04/16 1553        Assessment/Plan: s/p Procedure(s): ESOPHAGOGASTRODUODENOSCOPY (EGD)  Adenocarcinoma of stomach, status post subtotal gastrectomy Soft [name], on twice a day  Protonix. Reglan added Sips of clear liquids only at this point due to persistent nausea TNA for protein calorie malnutrition  Diarrhea.  A relatively new problem according to patient and family. C. difficile ordered.  CAD.  History CABG.  Pacemaker. Chronic atrial fibrillation  Hypokalemia.  Potassium 2.9.  We'll continue to supplement.       LOS: 1 day    [name] M 02/07/2016

## 2016-02-07 NOTE — Progress Notes (Signed)
ANTICOAGULATION CONSULT NOTE - Follow Up Consult  Pharmacy Consult for heparin Indication: atrial fibrillation   Labs:  Recent Labs  02/05/16 0245 02/06/16 0602 02/07/16 0520  HGB 12.1 11.1*  --   HCT 39.2 36.6  --   PLT 212 186  --   LABPROT 21.8* 16.8* 16.7*  INR 1.91* 1.36 1.34  HEPARINUNFRC  --   --  0.10*  CREATININE 0.98 0.98  --      Assessment: 81yo female subtherapeutic on heparin with initial dosing while Coumadin on hold.  Goal of Therapy:  Heparin level 0.3-0.7 units/ml   Plan:  Will increase heparin gtt by 4 units/kg/hr to 1200 units/hr and check level in [place], PharmD, BCPS  02/07/2016,6:15 AM

## 2016-02-07 NOTE — Progress Notes (Signed)
HR in the 80s to 90s over the past hour, confirmed with Cardiac monitoring.  Told them to notify me if >120 in the future since have cardizem PRN if needed.  Pt has thrush, order received.

## 2016-02-08 DIAGNOSIS — E43 Unspecified severe protein-calorie malnutrition: Secondary | ICD-10-CM

## 2016-02-08 LAB — GLUCOSE, CAPILLARY
GLUCOSE-CAPILLARY: 137 mg/dL — AB (ref 65–99)
GLUCOSE-CAPILLARY: 133 mg/dL — AB (ref 65–99)
GLUCOSE-CAPILLARY: 114 mg/dL — AB (ref 65–99)
Glucose-Capillary: 120 mg/dL — ABNORMAL HIGH (ref 65–99)

## 2016-02-08 LAB — CBC
HCT: 36.5 % (ref 36.0–46.0)
Hemoglobin: 11.8 g/dL — ABNORMAL LOW (ref 12.0–15.0)
MCH: 29.1 pg (ref 26.0–34.0)
MCHC: 32.3 g/dL (ref 30.0–36.0)
MCV: 89.9 fL (ref 78.0–100.0)
PLATELETS: 180 10*3/uL (ref 150–400)
RBC: 4.06 MIL/uL (ref 3.87–5.11)
RDW: 17.3 % — AB (ref 11.5–15.5)
WBC: 9.7 10*3/uL (ref 4.0–10.5)

## 2016-02-08 LAB — HEPARIN LEVEL (UNFRACTIONATED)
HEPARIN UNFRACTIONATED: 0.59 [IU]/mL (ref 0.30–0.70)
Heparin Unfractionated: 0.46 IU/mL (ref 0.30–0.70)

## 2016-02-08 LAB — BASIC METABOLIC PANEL
ANION GAP: 10 (ref 5–15)
BUN: 9 mg/dL (ref 6–20)
CALCIUM: 8.9 mg/dL (ref 8.9–10.3)
CO2: 20 mmol/L — ABNORMAL LOW (ref 22–32)
Chloride: 110 mmol/L (ref 101–111)
Creatinine, Ser: 0.79 mg/dL (ref 0.44–1.00)
GFR calc Af Amer: >60 mL/min (ref >60)
GLUCOSE: 120 mg/dL — AB (ref 65–99)
POTASSIUM: 3.3 mmol/L — AB (ref 3.5–5.1)
SODIUM: 140 mmol/L (ref 135–145)

## 2016-02-08 LAB — PROTIME-INR
INR: 1.45 (ref 0.00–1.49)
PROTHROMBIN TIME: 17.7 s — AB (ref 11.6–15.2)

## 2016-02-08 MED ORDER — POTASSIUM CHLORIDE CRYS ER 20 MEQ PO TBCR
40.0000 meq | EXTENDED_RELEASE_TABLET | Freq: Once | ORAL | Status: AC
  Administered 2016-02-08: 40 meq via ORAL
  Filled 2016-02-08: qty 2

## 2016-02-08 MED ORDER — POTASSIUM CHLORIDE IN NACL 20-0.45 MEQ/L-% IV SOLN
INTRAVENOUS | Status: DC
  Administered 2016-02-08: 30 mL/h via INTRAVENOUS
  Filled 2016-02-08: qty 1000

## 2016-02-08 MED ORDER — POTASSIUM CHLORIDE 10 MEQ/100ML IV SOLN
10.0000 meq | INTRAVENOUS | Status: AC
  Administered 2016-02-08 (×4): 10 meq via INTRAVENOUS
  Filled 2016-02-08 (×4): qty 100

## 2016-02-08 MED ORDER — PREGABALIN 75 MG PO CAPS
75.0000 mg | ORAL_CAPSULE | Freq: Two times a day (BID) | ORAL | Status: DC
  Administered 2016-02-08 – 2016-02-13 (×11): 75 mg via ORAL
  Filled 2016-02-08 (×11): qty 1

## 2016-02-08 MED ORDER — WARFARIN SODIUM 3 MG PO TABS
3.0000 mg | ORAL_TABLET | Freq: Once | ORAL | Status: AC
  Administered 2016-02-08: 3 mg via ORAL
  Filled 2016-02-08: qty 1

## 2016-02-08 MED ORDER — FENTANYL CITRATE (PF) 100 MCG/2ML IJ SOLN
12.5000 ug | Freq: Once | INTRAMUSCULAR | Status: AC
  Administered 2016-02-11: 12.5 ug via INTRAVENOUS
  Filled 2016-02-08: qty 2

## 2016-02-08 MED ORDER — FAT EMULSION 20 % IV EMUL
240.0000 mL | INTRAVENOUS | Status: AC
  Administered 2016-02-08: 240 mL via INTRAVENOUS
  Filled 2016-02-08: qty 250

## 2016-02-08 MED ORDER — TRACE MINERALS CR-CU-MN-SE-ZN 10-1000-500-60 MCG/ML IV SOLN
INTRAVENOUS | Status: AC
  Administered 2016-02-08: 18:00:00 via INTRAVENOUS
  Filled 2016-02-08: qty 1680

## 2016-02-08 MED ORDER — MAGNESIUM SULFATE 2 GM/50ML IV SOLN
2.0000 g | Freq: Once | INTRAVENOUS | Status: AC
  Administered 2016-02-08: 2 g via INTRAVENOUS
  Filled 2016-02-08: qty 50

## 2016-02-08 MED ORDER — FENTANYL CITRATE (PF) 100 MCG/2ML IJ SOLN
25.0000 ug | Freq: Once | INTRAMUSCULAR | Status: AC
  Administered 2016-02-08: 25 ug via INTRAVENOUS
  Filled 2016-02-08: qty 2

## 2016-02-08 MED ORDER — DILTIAZEM HCL 60 MG PO TABS
60.0000 mg | ORAL_TABLET | Freq: Four times a day (QID) | ORAL | Status: DC
  Administered 2016-02-08 – 2016-02-09 (×4): 60 mg via ORAL
  Filled 2016-02-08 (×4): qty 1

## 2016-02-08 MED ORDER — WARFARIN - PHARMACIST DOSING INPATIENT
Freq: Every day | Status: DC
  Administered 2016-02-08 – 2016-02-12 (×2)

## 2016-02-08 NOTE — Progress Notes (Signed)
PARENTERAL NUTRITION CONSULT NOTE - INITIAL  Pharmacy Consult for TPN Indication: Intolerance to enteral feeding   Patient Measurements: Height: 5\' 1"  (154.9 cm) Weight: 134 lb 0.6 oz (60.8 kg) IBW/kg (Calculated) : 47.8  Medical History: Past Medical History  Diagnosis Date  . PAF (paroxysmal atrial fibrillation) ([place])     controlled with amiodarone, on coumadin  . CAD (coronary artery disease)   . Aortic root dilatation ([place])   . Moderate aortic stenosis   . Chronic anticoagulation   . Tachycardia-bradycardia syndrome ([place])     s/p PPM by Dr Doreatha Lew (MDT) 07/03/10  . GERD (gastroesophageal reflux disease)   . Pacemaker     2012  . Neuropathy ([place])     feet/legs  . Restless leg syndrome   . Headache(784.0)     hx migraines - takes Zoloft for migraines  . History of uterine cancer 1980s    treated with hysterectomy, external radiation and radiation seed implants.   . Cancer ([place])     hx Kidney cancer / hx of endometrial cancer  . Anxiety   . Heart murmur   . HTN (hypertension)   . Hyperlipemia   . Hypothyroidism   . Osteomyelitis ([place]) as child  . Family history of anesthesia complication     daughter has ponv  . Syncope 05/28/2014  . Elevated LFTs     hepatic steatosis  . Gastroparesis   . Anal fissure   . Radiation proctitis   . Neuromuscular disorder ([facility])     neuropathy in right leg  . Adenocarcinoma of stomach ([place]) 11/11/2015    gastric mass on egd  . PONV (postoperative nausea and vomiting)     30 yrs ago since then no problem  . CHF (congestive heart failure) ([place])   . Pneumonia     hx  . Chronic kidney disease     only has one kidney rt lft rem 03 ca  . 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 Anemia     hx 3/17 iron infusion, on aranesp and injected B12 since 11/2015.   . Stroke ([place]) 15    partial stroke left legally blind  . Nausea 01/2016  . Transfusion of blood product refused for religious reason     Insulin Requirements in the past 24 hours:  2 units  of SSI  Assessment: 82 yo F presents with intractable nausea and vomiting. Recent diagnosis of adenocarcinoma of the stomach and is s/p gastrectomy earlier this month. Per family the patient has struggled with poor intake for the past week and a half and has now been unable to keep anything down for the past 2 days. Reports about a 10 lb weight loss over last 1-2 weeks.  GI: Albumin low at 2.8, prealbumin ok at 18.1. Surgery keeping on sips of clear liquids due to persistent nausea.  H2 blocker in TPN Endo: CBGs controlled 90-130s. No hx of DM. Lytes: wnl exc K still low at 3.3 but up after replacement, Mg ok at 1.8 after replacement. CoCa 9.6 Renal: SCr stable, CrCl ~73ml/min. UOP good at 1.3 ml/kg/hr. 1/2NS with KCl 94mEq at 180ml/hr Pulm: RA Cards: BP elevated and HR ok. Moved to 2W due to Afib. Dilt and heparin gtt started Hepatobil: LFTs wnl, TBili 0.9. Trig wnl. Neuro: GCS 15 ID: Afebrile, WBC wnl. On Levaquin for UTI and possible PNA. Also on fluconazole for oral thrush.  Best Practices:  SCDs TPN Access: PICC Double Lumen 6/2 > TPN start date:  6/2 >  Nutritional Goals: per RD recs on 6/2 Kcal: 1600-1800 Protein: 75-90 g  Current Nutrition:  Bariatric clear liquid  Plan:  Increase Clinimix E 5/15 to 45ml/hr Continue 20% lipid emulsion at 15ml/hr Decrease 1/2NS w/ KCl 31mEq/L to 30 ml/hr This provides 84 g of protein and 1672 kCals per day meeting 100% of protein and kCal needs Add MVI and TE in TPN Add famotidine 20mg  to TPN Add sensitive SSI Q6H and adjust as needed Monitor TPN labs F/U advancement of diet  Give KCl 63mEq IV x 4 runs per Surgery  Elenor Quinones, PharmD, BCPS Clinical Pharmacist Pager [phone] 02/08/2016 7:40 AM

## 2016-02-08 NOTE — Progress Notes (Signed)
ANTICOAGULATION CONSULT NOTE - Follow Up Consult  Pharmacy Consult for heparin Indication: atrial fibrillation   Labs:  Recent Labs  02/05/16 0245 02/06/16 0602 02/07/16 0520 02/07/16 1500 02/08/16 0059  HGB 12.1 11.1* 11.3*  --   --   HCT 39.2 36.6 35.8*  --   --   PLT 212 186 184  --   --   LABPROT 21.8* 16.8* 16.7*  --   --   INR 1.91* 1.36 1.34  --   --   HEPARINUNFRC  --   --  0.10* 0.66 0.59  CREATININE 0.98 0.98 0.84  --   --      Assessment/Plan:  82yo female remains therapeutic on heparin. Will continue gtt at current rate and confirm stable with am labs.   Wynona Neat, PharmD, BCPS  02/08/2016,1:16 AM

## 2016-02-08 NOTE — Progress Notes (Addendum)
[place] for Coumadin --> Heparin Indication: atrial fibrillation  Allergies  Allergen Reactions  . Amiodarone      Thyroid and liver and kidney problems  . Codeine Nausea And Vomiting  . Gabapentin Itching  . Hydrocodone Other (See Comments)    hallucination  . Morphine And Related Nausea And Vomiting  . Requip [Ropinirole Hcl]     Headache   . Zinc     nausea    Patient Measurements: Height: 5\' 1"  (154.9 cm) Weight: 134 lb 0.6 oz (60.8 kg) IBW/kg (Calculated) : 47.8  Vital Signs: Temp: 97.9 F (36.6 C) (06/04 0412) Temp Source: Oral (06/04 0412) BP: 155/72 mmHg (06/04 0412) Pulse Rate: 91 (06/04 0033)  Labs:  Recent Labs  02/06/16 0602  02/07/16 0520 02/07/16 1500 02/08/16 0059 02/08/16 0530  HGB 11.1*  --  11.3*  --   --  11.8*  HCT 36.6  --  35.8*  --   --  36.5  PLT 186  --  184  --   --  180  LABPROT 16.8*  --  16.7*  --   --  17.7*  INR 1.36  --  1.34  --   --  1.45  HEPARINUNFRC  --   < > 0.10* 0.66 0.59 0.46  CREATININE 0.98  --  0.84  --   --  0.79  < > = values in this interval not displayed.  Estimated Creatinine Clearance: 47.7 mL/min (by C-G formula based on Cr of 0.79).   Assessment: 81yof on coumadin pta for afib, admitted 5/30 with N/V. INR 2.84 on admission now s/p INR reversal and EGD and heparin has been restarted. -Heparin level= 0.46 and at goal on1200 units/hr   Goal of Therapy:  INR 2-3 Monitor platelets by anticoagulation protocol: Yes   Plan:  -No heparin changes needed -Daily heparin level and CBC  Hildred Laser, Pharm D 02/08/2016 10:45 AM  Addendum -restart coumadin if able to tolerate po -INR= 1.45 -patient noted on levaquin and fluconazole  Plan -Coumadin 3mg  po today -Daily PT/INR  Hildred Laser, Pharm D 02/08/2016 1:39 PM

## 2016-02-08 NOTE — Progress Notes (Signed)
3 Days Post-Op  Subjective: Transferred to [facility] telemetry because of new onset rapid atrial fibrillation. Now on heparin drip Denies chest pain or dyspnea Denies abdominal pain. Denies nausea or vomiting Had a loose stool yesterday.  This is chronic. C difficile antigen and toxin negative Remains afebrile. On antibiotics for UTI and Diflucan for oral thrush. On TPN and bowel rush because of nausea vomiting and esophagitis.  Objective: Vital signs in last 24 hours: Temp:  [97.9 F (36.6 C)-99.5 F (37.5 C)] 97.9 F (36.6 C) (06/04 0412) Pulse Rate:  [87-103] 91 (06/04 0033) Resp:  [16-20] 18 (06/04 0412) BP: (155-199)/(66-107) 155/72 mmHg (06/04 0412) SpO2:  [97 %-100 %] 97 % (06/04 0412) Last BM Date: 02/06/16  Intake/Output from previous day: 06/03 0701 - 06/04 0700 In: 3185.7 [I.V.:1729.9; IV Piggyback:255; TPN:1200.8] Out: 1826 [Urine:1825; Stool:1] Intake/Output this shift: Total I/O In: 1100.5 [I.V.:528; IV Piggyback:205; TPN:367.5] Out: 325 [Urine:325]  General appearance: Alert and pleasant.  No obvious distress.  Feeble and deconditioned.  Family in room.  Alert and oriented. Resp: clear to auscultation bilaterally GI: Abdomen soft and nontender.  Incisions well-healed.  Lab Results:   Recent Labs  02/06/16 0602 02/07/16 0520  WBC 6.7 8.5  HGB 11.1* 11.3*  HCT 36.6 35.8*  PLT 186 184   BMET  Recent Labs  02/06/16 0602 02/07/16 0520  NA 142 138  K 3.5 2.9*  CL 113* 107  CO2 20* 21*  GLUCOSE 88 118*  BUN 10 8  CREATININE 0.98 0.84  CALCIUM 8.9 8.7*   PT/INR  Recent Labs  02/06/16 0602 02/07/16 0520  LABPROT 16.8* 16.7*  INR 1.36 1.34   ABG No results for input(s): PHART, HCO3 in the last 72 hours.  Invalid input(s): PCO2, PO2  Studies/Results: Dg Chest Port 1 View  02/06/2016  CLINICAL DATA:  PICC line placement EXAM: PORTABLE CHEST 1 VIEW COMPARISON:  02/06/2015 FINDINGS: LEFT-sided PICC line placed with tip distal SVC. Stable  cardiac silhouette. Small LEFT effusion. Mild central venous pulmonary congestion. No pneumothorax IMPRESSION: 1. PICC line in proper position. 2. LEFT effusion insert venous congestion. Electronically Signed   By: Suzy Bouchard [name].D.   On: 02/06/2016 16:57   Dg Chest Port 1 View  02/06/2016  CLINICAL DATA:  Coughing.  Fever and rule out pneumonia. EXAM: PORTABLE CHEST 1 VIEW COMPARISON:  02/03/2016.  PET-CT 12/01/2015 FINDINGS: Stable position of the right dual chamber cardiac pacemaker. New or increased densities at the left lung base. Right lung remains clear. Heart size is grossly stable and within normal limits. Atherosclerotic calcifications at the aortic arch. No acute bone abnormality. IMPRESSION: Increased densities at the left lung base are suggestive for pleural fluid and consolidation. Electronically Signed   By: Markus Daft [name].D.   On: 02/06/2016 11:45    Anti-infectives: Anti-infectives    Start     Dose/Rate Route Frequency Ordered Stop   02/07/16 1445  levofloxacin (LEVAQUIN) IVPB 750 mg     750 mg 100 mL/hr over 90 Minutes Intravenous Every 48 hours 02/07/16 1424     02/07/16 0815  fluconazole (DIFLUCAN) IVPB 100 mg     100 mg 50 mL/hr over 60 Minutes Intravenous Every 24 hours 02/07/16 0813     02/06/16 1500  azithromycin (ZITHROMAX) 500 mg in dextrose 5 % 250 mL IVPB  Status:  Discontinued     500 mg 250 mL/hr over 60 Minutes Intravenous Every 24 hours 02/06/16 1454 02/07/16 1424   02/04/16 1700  cefTRIAXone (  ROCEPHIN) 1 g in dextrose 5 % 50 mL IVPB  Status:  Discontinued     1 g 100 mL/hr over 30 Minutes Intravenous Every 24 hours 02/04/16 1553 02/07/16 1424      Assessment/Plan: s/p Procedure(s): ESOPHAGOGASTRODUODENOSCOPY (EGD)  Adenocarcinoma of stomach, status post subtotal gastrectomy 01/15/2016 Esophagitis -  on twice a day Protonix. Reglan added Sips of clear liquids only at this point due to persistent nausea TNA for protein calorie malnutrition  Diarrhea.  Family reports this is somewhat chronic due to radiation therapy.  C. difficile negative.  CAD. History CABG. Pacemaker. Chronic atrial fibrillation, now with RVR on heparin drip.  Hypokalemia. Potassium 2.9. We'll continue to supplement.  Chemistries are pending this morning  UTI - on levaquin.  Cultures show enterococcus and Citrobacter.  Appears sensitive to quinolones.  Thrush - on diflucan   LOS: 2 days    [name] 02/08/2016

## 2016-02-08 NOTE — Progress Notes (Signed)
PROGRESS NOTE                                                                                                                                                                                                             Patient Demographics:    [name], is a 82 y.o. female, DOB - Feb 21, 1934, [id number]  Admit date - 02/03/2016   Admitting Physician [name], MD  Outpatient Primary MD for the patient is [name], MD  LOS -   Outpatient Specialists: [name]  Chief Complaint  Patient presents with  . Emesis       Brief Narrative   82 year old female with paroxysmal A. fib, CAD with history of CABG, moderate BS, tachycardia-bradycardia syndrome status post pacemaker, GERD, peripheral neuropathy, hypertension, hypothyroidism, CVA with left eye blindness, recently diagnosed adenocarcinoma of the stomach status post gastrectomy done earlier this month by [name] presented to the ED with nausea with multiple episodes of vomiting for 1 day.. Patient reported dysphagia with frequent loose stools.  -Barium esophagogram-- presbyesophagus with contrast passing into the proximal small bowel.  - EGD showing mid-distal third esophagitis without bleeding. Patent surgical anastomosis. Showed mid-distal reflux esophagitis, ,gastric ulcers at the gastric anastomosis with no bleeding stigmata. (Largest measuring 5 mm) Hospital course prolonged due to nausea and vomiting causing inability to take her medications  requiring TNA and rapid A. fib   Subjective:   Patient transferred to 2 W. on Cardizem drip due to rapid A. fib.. Much better controlled this morning.   Assessment  & Plan :    Principal Problem:   Intractable nausea and vomiting EGD findings as above. -BID  PPI, started developing flushing with IV Reglan which is stopped.  Recommend strict antireflux measures with HOB elevation at all times.    Start clear liquid diet this morning. Advance slowly to bariatric diet if tolerated. -Given severe malnutrition, PICC line ordered to start TNA by [name].  Active Problems:  SIRS Pt febrile with tachycardia. CXR showed increased left lung base densities suggestive of pleural fluid and consolidation.. Urine culture growing enterococcus and Citrobacter. Antibiotic switched to Levaquin.  UTI On Rocephin. Cultures growing Enterobacter and Citrobacter. Switched to Rocephin.  A. fib with RVR Possibly due to GI symptoms and underlying infection. Now requiring Cardizem drip. Will titrate to discontinue. If stable  will start her on oral Cardizem along with her home dose of beta blocker. Started on heparin drip as patient was unable to tolerate by mouth. Resume Coumadin today if tolerating.  Uncontrolled hypertension Blood pressure now better with hydralazine, scheduled metoprolol and Cardizem drip.  Adenocarcinoma of stomach ([place]) Status post subtotal gastrectomy recently. Consult [name] as needed.   Hypokalemia/hyponatremia   secondary to dehydration Replenish aggressively.    Hypothyroidism Synthroid at IV dose.  CAD with history of CABG IV metoprolol.  Oral thrush Added fluconazole.    Code Status : Full code  Family Communication  : Daughter at bedside  Disposition Plan  : Home once clinically improved.  Barriers For Discharge : Dysphagia, rapid A. fib  Consults  :   [facility]  Procedures  :  barium esophagogram EGD  DVT Prophylaxis  :  IV heparin. Resume Coumadin once able to tolerate.  Lab Results  Component Value Date   PLT 180 02/08/2016    Antibiotics  :    Anti-infectives    Start     Dose/Rate Route Frequency Ordered Stop   02/07/16 1445  levofloxacin (LEVAQUIN) IVPB 750 mg     750 mg 100 mL/hr over 90 Minutes Intravenous Every 48 hours 02/07/16 1424     02/07/16 0815  fluconazole (DIFLUCAN) IVPB 100 mg     100 mg 50 mL/hr over 60  Minutes Intravenous Every 24 hours 02/07/16 0813     02/06/16 1500  azithromycin (ZITHROMAX) 500 mg in dextrose 5 % 250 mL IVPB  Status:  Discontinued     500 mg 250 mL/hr over 60 Minutes Intravenous Every 24 hours 02/06/16 1454 02/07/16 1424   02/04/16 1700  cefTRIAXone (ROCEPHIN) 1 g in dextrose 5 % 50 mL IVPB  Status:  Discontinued     1 g 100 mL/hr over 30 Minutes Intravenous Every 24 hours 02/04/16 1553 02/07/16 1424        Objective:   Filed Vitals:   02/08/16 0033 02/08/16 0112 02/08/16 0138 02/08/16 0412  BP: 166/79 167/87 156/66 155/72  Pulse: 91     Temp:  98.4 F (36.9 C)  97.9 F (36.6 C)  TempSrc:  Oral  Oral  Resp:  20  18  Height:      Weight:      SpO2:  98% 98% 97%    Wt Readings from Last 3 Encounters:  02/03/16 60.8 kg (134 lb 0.6 oz)  01/17/16 73.846 kg (162 lb 12.8 oz)  01/07/16 65.318 kg (144 lb)     Intake/Output Summary (Last 24 hours) at 02/08/16 1247 Last data filed at 02/07/16 2300  Gross per 24 hour  Intake 3185.72 ml  Output   1150 ml  Net 2035.72 ml     Physical Exam  Gen: Appears fatigued HEENT: no pallor, dry mucosa, supple neck Chest: clear b/l, no added sounds CVS: S1 and S2 irregular regular, no murmurs rubs or gallop GI: soft, NT, ND, BS+ Musculoskeletal: warm, no edema     Data Review:    CBC  Recent Labs Lab 02/04/16 0454 02/05/16 0245 02/06/16 0602 02/07/16 0520 02/08/16 0530  WBC 8.4 8.4 6.7 8.5 9.7  HGB 12.6 12.1 11.1* 11.3* 11.8*  HCT 40.6 39.2 36.6 35.8* 36.5  PLT 252 212 186 184 180  MCV 92.7 92.7 94.3 90.6 89.9  MCH 28.8 28.6 28.6 28.6 29.1  MCHC 31.0 30.9 30.3 31.6 32.3  RDW 17.7* 17.8* 17.6* 16.8* 17.3*  LYMPHSABS 1.3 1.9 1.5  --   --  MONOABS 0.7 0.5 0.6  --   --   EOSABS 0.2 0.4 0.4  --   --   BASOSABS 0.1 0.1 0.0  --   --     Chemistries   Recent Labs Lab 02/03/16 1655 02/04/16 0454 02/05/16 0245 02/06/16 0602 02/07/16 0520 02/08/16 0530  NA 147* 148* 146* 142 138 140  K 3.4*  3.4* 3.5 3.5 2.9* 3.3*  CL 115* 117* 118* 113* 107 110  CO2 21* 22 18* 20* 21* 20*  GLUCOSE 126* 133* 103* 88 118* 120*  BUN 18 17 15 10 8 9   CREATININE 1.06* 1.00 0.98 0.98 0.84 0.79  CALCIUM 9.5 9.4 9.1 8.9 8.7* 8.9  MG 1.7  --   --  1.6* 1.8  --   AST 19 20 24 23   --   --   ALT 12* 13* 13* 13*  --   --   ALKPHOS 90 83 80 74  --   --   BILITOT 0.7 0.7 0.6 0.9  --   --    ------------------------------------------------------------------------------------------------------------------  Recent Labs  02/06/16 0602  TRIG 132    Lab Results  Component Value Date   HGBA1C 5.7* 10/09/2015   ------------------------------------------------------------------------------------------------------------------ No results for input(s): TSH, T4TOTAL, T3FREE, THYROIDAB in the last 72 hours.  Invalid input(s): FREET3 ------------------------------------------------------------------------------------------------------------------ No results for input(s): VITAMINB12, FOLATE, FERRITIN, TIBC, IRON, RETICCTPCT in the last 72 hours.  Coagulation profile  Recent Labs Lab 02/04/16 0454 02/05/16 0245 02/06/16 0602 02/07/16 0520 02/08/16 0530  INR 3.22* 1.91* 1.36 1.34 1.45    No results for input(s): DDIMER in the last 72 hours.  Cardiac Enzymes No results for input(s): CKMB, TROPONINI, MYOGLOBIN in the last 168 hours.  Invalid input(s): CK ------------------------------------------------------------------------------------------------------------------ No results found for: BNP  Inpatient Medications  Scheduled Meds: . fentaNYL (SUBLIMAZE) injection  25 mcg Intravenous Once  . fluconazole (DIFLUCAN) IV  100 mg Intravenous Q24H  . insulin aspart  0-9 Units Subcutaneous Q6H  . levofloxacin (LEVAQUIN) IV  750 mg Intravenous Q48H  . levothyroxine  25 mcg Intravenous QAC breakfast  . magnesium sulfate 1 - 4 g bolus IVPB  2 g Intravenous Once  . methocarbamol (ROBAXIN)  IV  500 mg  Intravenous Q12H  . sodium chloride flush  3 mL Intravenous Q12H   Continuous Infusions: . 0.45 % NaCl with KCl 20 mEq / L 60 mL/hr at 02/07/16 1545  . 0.45 % NaCl with KCl 20 mEq / L    . diltiazem (CARDIZEM) infusion 10 mg/hr (02/08/16 0856)  . Marland KitchenTPN (CLINIMIX-E) Adult 40 mL/hr at 02/07/16 1749   And  . fat emulsion 240 mL (02/07/16 1749)  . Marland KitchenTPN (CLINIMIX-E) Adult     And  . fat emulsion    . heparin 1,200 Units/hr (02/07/16 1801)   PRN Meds:.acetaminophen, diphenhydrAMINE, hydrALAZINE, prochlorperazine, sodium chloride flush  Micro Results Recent Results (from the past 240 hour(s))  Urine culture     Status: Abnormal   Collection Time: 02/04/16  6:35 PM  Result Value Ref Range Status   Specimen Description URINE, CLEAN CATCH  Final   Special Requests Normal  Final   Culture (A)  Final    50,000 COLONIES/mL ENTEROCOCCUS SPECIES 50,000 COLONIES/mL CITROBACTER FREUNDII    Report Status 02/07/2016 FINAL  Final   Organism ID, Bacteria ENTEROCOCCUS SPECIES (A)  Final   Organism ID, Bacteria CITROBACTER FREUNDII (A)  Final      Susceptibility   Citrobacter freundii - MIC*    CEFAZOLIN >=  64 RESISTANT Resistant     CEFTRIAXONE <=1 SENSITIVE Sensitive     CIPROFLOXACIN <=0.25 SENSITIVE Sensitive     GENTAMICIN <=1 SENSITIVE Sensitive     IMIPENEM 0.5 SENSITIVE Sensitive     NITROFURANTOIN <=16 SENSITIVE Sensitive     TRIMETH/SULFA <=20 SENSITIVE Sensitive     PIP/TAZO <=4 SENSITIVE Sensitive     * 50,000 COLONIES/mL CITROBACTER FREUNDII   Enterococcus species - MIC*    AMPICILLIN <=2 SENSITIVE Sensitive     LEVOFLOXACIN 1 SENSITIVE Sensitive     NITROFURANTOIN <=16 SENSITIVE Sensitive     VANCOMYCIN 1 SENSITIVE Sensitive     * 50,000 COLONIES/mL ENTEROCOCCUS SPECIES  Culture, blood (routine x 2)     Status: None (Preliminary result)   Collection Time: 02/06/16  8:15 AM  Result Value Ref Range Status   Specimen Description BLOOD RIGHT ANTECUBITAL  Final   Special Requests  BOTTLES DRAWN AEROBIC ONLY 8CC  Final   Culture NO GROWTH < 24 HOURS  Final   Report Status PENDING  Incomplete  Culture, blood (routine x 2)     Status: None (Preliminary result)   Collection Time: 02/06/16  8:25 AM  Result Value Ref Range Status   Specimen Description BLOOD LEFT HAND  Final   Special Requests IN PEDIATRIC BOTTLE 3CC  Final   Culture NO GROWTH < 24 HOURS  Final   Report Status PENDING  Incomplete  C difficile quick scan w PCR reflex     Status: None   Collection Time: 02/07/16  6:07 PM  Result Value Ref Range Status   C Diff antigen NEGATIVE NEGATIVE Final   C Diff toxin NEGATIVE NEGATIVE Final   C Diff interpretation Negative for toxigenic C. difficile  Final    Radiology Reports Dg Esophagus  02/04/2016  CLINICAL DATA:  Vomiting for 2 days 3 weeks status post partial gastrectomy. Evaluate for food impaction. EXAM: ESOPHOGRAM/BARIUM SWALLOW TECHNIQUE: Single contrast examination was performed using  thin barium. FLUOROSCOPY TIME:  Radiation Exposure Index (as provided by the fluoroscopic device): 238.9 uGy*m2 If the device does not provide the exposure index: Fluoroscopy Time:  54 seconds Number of Acquired Images:  Fluoro only. COMPARISON:  Postoperative upper GI series 01/23/2016. FINDINGS: Study was performed in the LPO and right lateral decubitus positions. The patient swallowed the barium without difficulty. There is mild presbyesophagus. No evidence of food impaction, mucosal ulceration or stricture. Contrast empties into the gastric pouch and proximal small bowel. No evidence of contrast leak. No laryngeal penetration observed. IMPRESSION: No evidence of food impaction, obstruction or extravasation. Presbyesophagus. Electronically Signed   By: Richardean Sale M.D.   On: 02/04/2016 08:50   Dg Chest Port 1 View  02/06/2016  CLINICAL DATA:  PICC line placement EXAM: PORTABLE CHEST 1 VIEW COMPARISON:  02/06/2015 FINDINGS: LEFT-sided PICC line placed with tip distal SVC.  Stable cardiac silhouette. Small LEFT effusion. Mild central venous pulmonary congestion. No pneumothorax IMPRESSION: 1. PICC line in proper position. 2. LEFT effusion insert venous congestion. Electronically Signed   By: Suzy Bouchard M.D.   On: 02/06/2016 16:57   Dg Chest Port 1 View  02/06/2016  CLINICAL DATA:  Coughing.  Fever and rule out pneumonia. EXAM: PORTABLE CHEST 1 VIEW COMPARISON:  02/03/2016.  PET-CT 12/01/2015 FINDINGS: Stable position of the right dual chamber cardiac pacemaker. New or increased densities at the left lung base. Right lung remains clear. Heart size is grossly stable and within normal limits. Atherosclerotic calcifications at the aortic arch.  No acute bone abnormality. IMPRESSION: Increased densities at the left lung base are suggestive for pleural fluid and consolidation. Electronically Signed   By: Markus Daft M.D.   On: 02/06/2016 11:45   Dg Abd Acute W/chest  02/03/2016  CLINICAL DATA:  Vomiting after stomach surgery 01/15/2016. Partial gastrectomy for cancer. EXAM: DG ABDOMEN ACUTE W/ 1V CHEST COMPARISON:  11/12/2015 abdominal CT FINDINGS: Sutures in the left upper quadrant correlating with partial gastrectomy. Study is limited by gasless abdomen. No dilated loops are seen. No evidence of pneumoperitoneum. Trace left effusion. No pneumonia or edema. No cardiomegaly. Dual-chamber pacer from the right with leads in unremarkable position. IMPRESSION: 1. Gasless abdomen, a nonspecific finding that limits sensitivity. No indication of bowel obstruction or perforation. 2. Trace left effusion. Electronically Signed   By: Monte Fantasia M.D.   On: 02/03/2016 18:21   Dg Duanne Limerick W/water Sol Cm  01/23/2016  CLINICAL DATA:  Status post partial gastrectomy.  Evaluate for leak. EXAM: WATER SOLUBLE UPPER GI SERIES TECHNIQUE: Single-column upper GI series was performed using water soluble contrast. CONTRAST:  1 ISOVUE-300 IOPAMIDOL (ISOVUE-300) INJECTION 61% COMPARISON:  01/18/2016  FLUOROSCOPY TIME:  Radiation Exposure Index (as provided by the fluoroscopic device): Not available If the device does not provide the exposure index: Fluoroscopy Time (in minutes and seconds):  1 minutes 18 seconds Number of Acquired Images:  1, the rest saved fluoroscopy FINDINGS: Scout images over the chest epigastrium were obtained. Patient had to take swallows LPO. In this position there was no emptying of the remnant stomach with frequent large volume gastroesophageal reflux. Edema and obstruction at the gastro enterostomy with expected, but when supine and RPO the stomach drained readily. Negative for leak. The duodenal C-loop opacified and is unremarkable. IMPRESSION: 1. Negative for postoperative leak or obstruction. 2. Positional drainage of the remnant stomach. When LPO there was no drainage and large volume, symptomatic gastroesophageal reflux. 3. Distal esophagitis related to #2 or recent NG tube. Electronically Signed   By: Monte Fantasia M.D.   On: 01/23/2016 09:49   Dg Duanne Limerick W/water Sol Cm  01/18/2016  CLINICAL DATA:  82 year old female status post partial gastrectomy. Evaluate for potential postoperative leak or obstruction. EXAM: WATER SOLUBLE UPPER GI SERIES TECHNIQUE: Single-column upper GI series was performed using water soluble contrast. CONTRAST:  60 mL of Gastrografin COMPARISON:  No priors. FLUOROSCOPY TIME:  If the device does not provide the exposure index: Fluoroscopy Time (in minutes and seconds):  1 minutes and 18 seconds Number of Acquired Images:  5 series with multiple images. FINDINGS: Initial image demonstrated a nasogastric a tube in place with tip in the gastric remnant and side-port in the distal esophagus. Midline skin staples are noted. Pacemaker leads extending over the expected location of the right atrial appendage and right ventricular apex. Gastrografin was infused via the patient's indwelling NG tube, which demonstrated a small gastric remnant. There was  gastroesophageal reflux into the esophagus throughout the examination. Contrast readily traversed the pylorus into the proximal small bowel. There was no extravasation of contrast material at any point during the examination. IMPRESSION: 1. Expected postoperative appearance of the stomach following partial gastrectomy, without evidence of postoperative leak or obstruction. These results will be called to the ordering clinician or representative by the Radiologist Assistant, and communication documented in the PACS or zVision Dashboard. Electronically Signed   By: [name] M.D.   On: 01/18/2016 15:59    Time Spent in minutes  69   Kairi Tufo M.D  on 02/08/2016 at 12:47 PM  Between 7am to 7pm - Pager - [phone]  After 7pm go to [email or web address] - password [facility]  [facility] -  Office  [phone]

## 2016-02-09 ENCOUNTER — Encounter: Payer: Self-pay | Admitting: Nurse Practitioner

## 2016-02-09 DIAGNOSIS — K2210 Ulcer of esophagus without bleeding: Secondary | ICD-10-CM | POA: Insufficient documentation

## 2016-02-09 DIAGNOSIS — K208 Other esophagitis: Secondary | ICD-10-CM

## 2016-02-09 DIAGNOSIS — K911 Postgastric surgery syndromes: Secondary | ICD-10-CM

## 2016-02-09 DIAGNOSIS — I10 Essential (primary) hypertension: Secondary | ICD-10-CM

## 2016-02-09 LAB — CBC
HCT: 34.4 % — ABNORMAL LOW (ref 36.0–46.0)
HEMOGLOBIN: 11 g/dL — AB (ref 12.0–15.0)
MCH: 29 pg (ref 26.0–34.0)
MCHC: 32 g/dL (ref 30.0–36.0)
MCV: 90.8 fL (ref 78.0–100.0)
PLATELETS: 182 10*3/uL (ref 150–400)
RBC: 3.79 MIL/uL — AB (ref 3.87–5.11)
RDW: 17.8 % — ABNORMAL HIGH (ref 11.5–15.5)
WBC: 7.4 10*3/uL (ref 4.0–10.5)

## 2016-02-09 LAB — COMPREHENSIVE METABOLIC PANEL
ALBUMIN: 2.7 g/dL — AB (ref 3.5–5.0)
ALT: 13 U/L — ABNORMAL LOW (ref 14–54)
ANION GAP: 8 (ref 5–15)
AST: 17 U/L (ref 15–41)
Alkaline Phosphatase: 74 U/L (ref 38–126)
BILIRUBIN TOTAL: 0.6 mg/dL (ref 0.3–1.2)
BUN: 13 mg/dL (ref 6–20)
CHLORIDE: 111 mmol/L (ref 101–111)
CO2: 20 mmol/L — AB (ref 22–32)
Calcium: 8.5 mg/dL — ABNORMAL LOW (ref 8.9–10.3)
Creatinine, Ser: 0.82 mg/dL (ref 0.44–1.00)
GFR calc Af Amer: >60 mL/min (ref >60)
GFR calc non Af Amer: >60 mL/min (ref >60)
GLUCOSE: 113 mg/dL — AB (ref 65–99)
POTASSIUM: 4.2 mmol/L (ref 3.5–5.1)
SODIUM: 139 mmol/L (ref 135–145)
Total Protein: 5.3 g/dL — ABNORMAL LOW (ref 6.5–8.1)

## 2016-02-09 LAB — DIFFERENTIAL
Basophils Absolute: 0 10*3/uL (ref 0.0–0.1)
Basophils Relative: 0 %
EOS PCT: 9 %
Eosinophils Absolute: 0.7 10*3/uL (ref 0.0–0.7)
LYMPHS ABS: 1.8 10*3/uL (ref 0.7–4.0)
LYMPHS PCT: 24 %
MONO ABS: 0.7 10*3/uL (ref 0.1–1.0)
Monocytes Relative: 9 %
NEUTROS ABS: 4.3 10*3/uL (ref 1.7–7.7)
Neutrophils Relative %: 57 %

## 2016-02-09 LAB — GLUCOSE, CAPILLARY
GLUCOSE-CAPILLARY: 146 mg/dL — AB (ref 65–99)
GLUCOSE-CAPILLARY: 109 mg/dL — AB (ref 65–99)
Glucose-Capillary: 115 mg/dL — ABNORMAL HIGH (ref 65–99)
Glucose-Capillary: 118 mg/dL — ABNORMAL HIGH (ref 65–99)
Glucose-Capillary: 128 mg/dL — ABNORMAL HIGH (ref 65–99)

## 2016-02-09 LAB — PREALBUMIN: PREALBUMIN: 14.1 mg/dL — AB (ref 18–38)

## 2016-02-09 LAB — PROTIME-INR
INR: 1.33 (ref 0.00–1.49)
Prothrombin Time: 16.6 seconds — ABNORMAL HIGH (ref 11.6–15.2)

## 2016-02-09 LAB — PHOSPHORUS: Phosphorus: 4.8 mg/dL — ABNORMAL HIGH (ref 2.5–4.6)

## 2016-02-09 LAB — HEPARIN LEVEL (UNFRACTIONATED): HEPARIN UNFRACTIONATED: 0.52 [IU]/mL (ref 0.30–0.70)

## 2016-02-09 LAB — MAGNESIUM: Magnesium: 1.9 mg/dL (ref 1.7–2.4)

## 2016-02-09 LAB — TRIGLYCERIDES: Triglycerides: 92 mg/dL (ref <150)

## 2016-02-09 MED ORDER — LEVOTHYROXINE SODIUM 25 MCG PO TABS
25.0000 ug | ORAL_TABLET | Freq: Every day | ORAL | Status: DC
  Administered 2016-02-10: 25 ug via ORAL
  Filled 2016-02-09: qty 1

## 2016-02-09 MED ORDER — DILTIAZEM HCL 100 MG IV SOLR
5.0000 mg/h | INTRAVENOUS | Status: DC
  Administered 2016-02-09: 5 mg/h via INTRAVENOUS
  Filled 2016-02-09: qty 100

## 2016-02-09 MED ORDER — DILTIAZEM LOAD VIA INFUSION
10.0000 mg | Freq: Once | INTRAVENOUS | Status: DC
  Filled 2016-02-09: qty 10

## 2016-02-09 MED ORDER — DILTIAZEM HCL 100 MG IV SOLR
5.0000 mg/h | INTRAVENOUS | Status: DC
  Administered 2016-02-09 – 2016-02-10 (×2): 5 mg/h via INTRAVENOUS
  Filled 2016-02-09: qty 100

## 2016-02-09 MED ORDER — METOPROLOL TARTRATE 50 MG PO TABS
50.0000 mg | ORAL_TABLET | Freq: Two times a day (BID) | ORAL | Status: DC
  Administered 2016-02-09 – 2016-02-13 (×8): 50 mg via ORAL
  Filled 2016-02-09 (×8): qty 1

## 2016-02-09 MED ORDER — MAGNESIUM SULFATE 50 % IJ SOLN
3.0000 g | Freq: Once | INTRAVENOUS | Status: AC
  Administered 2016-02-09: 3 g via INTRAVENOUS
  Filled 2016-02-09: qty 6

## 2016-02-09 MED ORDER — TRACE MINERALS CR-CU-MN-SE-ZN 10-1000-500-60 MCG/ML IV SOLN
INTRAVENOUS | Status: AC
  Administered 2016-02-09: 18:00:00 via INTRAVENOUS
  Filled 2016-02-09: qty 1680

## 2016-02-09 MED ORDER — METHOCARBAMOL 500 MG PO TABS: 500.0000 mg | ORAL_TABLET | Freq: Three times a day (TID) | ORAL | Status: DC | PRN

## 2016-02-09 MED ORDER — WARFARIN SODIUM 3 MG PO TABS
3.0000 mg | ORAL_TABLET | Freq: Once | ORAL | Status: AC
  Administered 2016-02-09: 3 mg via ORAL
  Filled 2016-02-09: qty 1

## 2016-02-09 MED ORDER — ALPRAZOLAM 0.5 MG PO TABS: 0.5000 mg | ORAL_TABLET | Freq: Three times a day (TID) | ORAL | Status: DC | PRN

## 2016-02-09 MED ORDER — FAT EMULSION 20 % IV EMUL
240.0000 mL | INTRAVENOUS | Status: AC
  Administered 2016-02-09: 240 mL via INTRAVENOUS
  Filled 2016-02-09: qty 250

## 2016-02-09 NOTE — Progress Notes (Signed)
Utilization review completed.  

## 2016-02-09 NOTE — Progress Notes (Signed)
[place] for Coumadin/Heparin Indication: atrial fibrillation  Allergies  Allergen Reactions  . Amiodarone      Thyroid and liver and kidney problems  . Codeine Nausea And Vomiting  . Gabapentin Itching  . Hydrocodone Other (See Comments)    hallucination  . Morphine And Related Nausea And Vomiting  . Requip [Ropinirole Hcl]     Headache   . Zinc     nausea    Patient Measurements: Height: 5\' 1"  (154.9 cm) Weight: 134 lb 0.6 oz (60.8 kg) IBW/kg (Calculated) : 47.8  Vital Signs: Temp: 98.1 F (36.7 C) (06/05 0557) Temp Source: Oral (06/05 0557) BP: 154/77 mmHg (06/05 0619) Pulse Rate: 91 (06/05 0613)  Labs:  Recent Labs  02/07/16 0520  02/08/16 0059 02/08/16 0530 02/09/16 0430  HGB 11.3*  --   --  11.8* 11.0*  HCT 35.8*  --   --  36.5 34.4*  PLT 184  --   --  180 182  LABPROT 16.7*  --   --  17.7* 16.6*  INR 1.34  --   --  1.45 1.33  HEPARINUNFRC 0.10*  < > 0.59 0.46 0.52  CREATININE 0.84  --   --  0.79 0.82  < > = values in this interval not displayed.  Estimated Creatinine Clearance: 46.5 mL/min (by C-G formula based on Cr of 0.82).   Assessment: 81yof on coumadin pta for afib, admitted 5/30 with N/V. INR 2.84 on admission now s/p INR reversal and EGD and heparin has been restarted. Warfarin restarted 6/4 per Rx.  HL remains therapeutic (0.52), INR 1.45>1.33. Hg 11 stable, plt wnl. No bleed documented. Note on LVQ, fluconazole - INR may be difficult to control.  Last pta dose documented: 3mg  on MWF, 2mg  AOD  Goal of Therapy:  INR 2-3 Monitor platelets by anticoagulation protocol: Yes   Plan:  Heparin at 1200 units/h - dc when INR therapeutic Warfarin 3mg  x 1 dose tonight Daily HL/INR/CBC Monitor s/sx bleeding   Elicia Lamp, PharmD, [facility] Clinical Pharmacist Pager [phone] 02/09/2016 10:32 AM

## 2016-02-09 NOTE — Care Management Important Message (Signed)
Important Message  Patient Details  Name: [name] MRN: [id number] Date of Birth: October 26, 1933   Medicare Important Message Given:  Yes    Aspen Deterding Abena 02/09/2016, 11:53 AM

## 2016-02-09 NOTE — Consult Note (Signed)
CARDIOLOGY CONSULT NOTE   Patient ID: [name] MRN: [id number] DOB/AGE: 12-21-1933 82 y.o.  Admit date: 02/03/2016  Primary Physician   [name], MD Primary Cardiologist: Dr. [name] Requesting MD: Dr. Clementeen Graham Reason for Consultation: Afib  HPI: [name] is a 82 year old female with a past medical history of PAF, CAD s/p CABG, tachy-brady syndrome s/p PPM in 2011, HTN, HLD, CVA with left eye blindness. She was recently diagnosed with adenocarcinoma of her stomach (s/p subtotal gastrectomy).   She was admitted on 02/03/16 with nausea and vomiting for one day. EGD showed mid distal third esophagitis without bleeding. She also has UTI this admission. Developed rapid Afib with RVR, cardiology consulted to help manage Afib.   Patient was seen by Dr. [name] in May of 2017, her pacer interrogation at that time revealed Afib 39% of the time. She sees Dr. Rayann Heman with EP. The last visit the plan was to start her on flecainide, however she was admitted to the hospital prior to initiation and found to have gastric tumor. As a result, surgery was needed and she was taken off her Coumadin and therefore no longer a candidate for chemical conversion.   She does feel palpitations with her Afib, denies chest pain and SOB. She does feel weak, mostly due to her acute illness. She was started on Cardizem drip, this was discontinued on 02/08/16 and she was prescribed po Cardizem, but developed rapid Afib today and the Cardizem drip was restarted. She is on heparin gtt currently. She is on TPN and unable to eat currently. She can take po medications. Of note, she has been intolerant of Amiodarone in the past.    Past Medical History  Diagnosis Date  . PAF (paroxysmal atrial fibrillation) ([place])   . CAD (coronary artery disease)   . Aortic root dilatation ([place])   . Moderate aortic stenosis   . Tachycardia-bradycardia syndrome ([place])     s/p PPM by Dr Doreatha Lew (MDT) 07/03/10  . GERD  (gastroesophageal reflux disease)   . Neuropathy ([place])     feet/legs  . Restless leg syndrome   . History of uterine cancer 1980s    treated with hysterectomy, external radiation and radiation seed implants.   . Cancer ([place])     hx Kidney cancer / hx of endometrial cancer  . Anxiety   . HTN (hypertension)   . Hyperlipemia   . Hypothyroidism   . Osteomyelitis ([place]) as child  . Elevated LFTs     hepatic steatosis  . Gastroparesis   . Anal fissure   . Radiation proctitis   . Neuromuscular disorder ([facility])     neuropathy in right leg  . Adenocarcinoma of stomach ([place]) 11/11/2015    gastric mass on egd  . CHF (congestive heart failure) ([place])   . Chronic kidney disease     only has one kidney rt lft rem 03 ca  . 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 Anemia     hx 3/17 iron infusion, on aranesp and injected B12 since 11/2015.   . Stroke ([place]) 15    partial stroke left legally blind     Past Surgical History  Procedure Laterality Date  . Cardiac catheterization  2008  . Nephrectomy Left     renal cell cancer  . Patent ductus arterious repair  ~ 1949  . Cholecystectomy  1970s  . Upper gastrointestinal endoscopy    . Total knee arthroplasty  02/07/2012    Procedure: TOTAL  KNEE ARTHROPLASTY;  Surgeon: Mauri Pole, MD;  Location: [facility];  Service: Orthopedics;  Laterality: Right;  . Total hip revision Right 2009    initial replacment surg  . I&d knee with poly exchange Right 12/17/2013    Procedure: IRRIGATION AND DEBRIDEMEN RIGHT TOTAL  KNEE WITH POLY EXCHANGE;  Surgeon: Mauri Pole, MD;  Location: [facility];  Service: Orthopedics;  Laterality: Right;  . Patellar tendon repair Right 03/06/2014    Procedure: AVULSION WITH PATELLA TENDON REPAIR;  Surgeon: Mauri Pole, MD;  Location: [facility];  Service: Orthopedics;  Laterality: Right;  . Total knee revision Right 07/29/2014    Procedure: RIGHT KNEE REVISION OF PREVIOUS REPAIR EXTENSOR MECHANISM;  Surgeon: Mauri Pole, MD;  Location: [facility];   Service: Orthopedics;  Laterality: Right;  . Colonoscopy N/A 11/11/2015    Procedure: COLONOSCOPY;  Surgeon: [name], MD;  Location: [facility];  Service: Endoscopy;  Laterality: N/A;  . Esophagogastroduodenoscopy N/A 11/11/2015    Procedure: ESOPHAGOGASTRODUODENOSCOPY (EGD);  Surgeon: [name], MD;  Location: [facility];  Service: Endoscopy;  Laterality: N/A;  . Total abdominal hysterectomy  87  . Laparoscopy N/A 01/15/2016    Procedure: LAPAROSCOPY DIAGNOSTIC;  Surgeon: Stark Klein, MD;  Location: [place];  Service: General;  Laterality: N/A;  . Gastrectomy N/A 01/15/2016    Procedure: OPEN PARTIAL GASTRECTOMY;  Surgeon: Stark Klein, MD;  Location: [place] [facility];  Service: General;  Laterality: N/A;  . Pacemaker insertion  07/04/11    MDT PPM implanted by Dr Doreatha Lew for tachy/brady  . Esophagogastroduodenoscopy N/A 02/05/2016    Procedure: ESOPHAGOGASTRODUODENOSCOPY (EGD);  Surgeon: Ladene Artist, MD;  [facility];  Service: Endoscopy;  Laterality: N/A;    Allergies  Allergen Reactions  . Amiodarone      Thyroid and liver and kidney problems  . Codeine Nausea And Vomiting  . Gabapentin Itching  . Hydrocodone Other (See Comments)    hallucination  . Morphine And Related Nausea And Vomiting  . Requip [Ropinirole Hcl]     Headache   . Zinc     nausea    I have reviewed the patient's current medications . fentaNYL (SUBLIMAZE) injection  12.5 mcg Intravenous Once  . fentaNYL (SUBLIMAZE) injection  25 mcg Intravenous Once  . fluconazole (DIFLUCAN) IV  100 mg Intravenous Q24H  . insulin aspart  0-9 Units Subcutaneous Q6H  . levofloxacin (LEVAQUIN) IV  750 mg Intravenous Q48H  . [START ON 02/10/2016] levothyroxine  25 mcg Oral QAC breakfast  . methocarbamol (ROBAXIN)  IV  500 mg Intravenous Q12H  . metoprolol tartrate  50 mg Oral BID  . pregabalin  75 mg Oral BID  . sodium chloride flush  3 mL Intravenous Q12H  . warfarin  3 mg Oral ONCE-1800  . Warfarin - Pharmacist  Dosing Inpatient   Does not apply q1800   . diltiazem (CARDIZEM) infusion 5 mg/hr (02/09/16 1219)  . Marland KitchenTPN (CLINIMIX-E) Adult 70 mL/hr at 02/08/16 1749   And  . fat emulsion 240 mL (02/08/16 1749)  . Marland KitchenTPN (CLINIMIX-E) Adult     And  . fat emulsion    . heparin 1,200 Units/hr (02/08/16 1537)   acetaminophen, ALPRAZolam, diphenhydrAMINE, hydrALAZINE, methocarbamol, prochlorperazine, sodium chloride flush  Prior to Admission medications   Medication Sig Start Date End Date Taking? Authorizing Provider  acetaminophen (TYLENOL) 325 MG tablet Take 2 tablets (650 mg total) by mouth every 4 (four) hours as needed for mild pain, moderate pain, fever  or headache. 01/26/16  Yes Almond Lint, MD  ALPRAZolam Prudy Feeler) 0.5 MG tablet TAKE 1/2-1 TABLET BY MOUTH 3 TIMES A DAY AS NEEDED FOR ANXIETY 01/01/16  Yes Courtney Forcucci, PA-C  atenolol (TENORMIN) 100 MG tablet Take 1 tablet (100 mg total) by mouth 2 (two) times daily. 05/19/15  Yes Cassell Clement, MD  B Complex-C (B-COMPLEX WITH VITAMIN C) tablet Take 1 tablet by mouth daily. 12/11/15  Yes Lucky Cowboy, MD  Biotin 5000 MCG CAPS Take 5,000 mcg by mouth every morning.    Yes Historical Provider, MD  cetirizine (ZYRTEC) 10 MG tablet Take 10 mg by mouth at bedtime.   Yes Historical Provider, MD  Cholecalciferol (VITAMIN D PO) Take 5,000 Units by mouth every morning.    Yes Historical Provider, MD  Cyanocobalamin 1000 MCG/ML KIT Inject 1,000 mcg into the skin as directed. Patient taking differently: Inject 1,000 mcg into the skin every 30 (thirty) days.  11/24/15  Yes Johney Maine, MD  diphenoxylate-atropine (LOMOTIL) 2.5-0.025 MG tablet Take 1 tablet by mouth 4 (four) times daily. 01/26/16  Yes Almond Lint, MD  guaiFENesin (MUCINEX) 600 MG 12 hr tablet Take 1,200 mg by mouth 2 (two) times daily as needed for cough or to loosen phlegm.    Yes Historical Provider, MD  levothyroxine (SYNTHROID, LEVOTHROID) 100 MCG tablet TAKE 1 TABLET BY MOUTH EVERY  DAY ON EMPTY STOMACH WITH A FULL GLASS OF WATER Patient taking differently: TAKE 1/2 TABLET (50 MCG TOTAL) BY MOUTH EVERY DAY ON EMPTY STOMACH WITH A FULL GLASS OF WATER 10/25/15  Yes Lucky Cowboy, MD  losartan (COZAAR) 50 MG tablet Take 1 tablet (50 mg total) by mouth daily. 11/26/15  Yes Hillis Range, MD  LYRICA 75 MG capsule TAKE ONE CAPSULE BY MOUTH TWICE A DAY 11/29/15  Yes Lucky Cowboy, MD  methocarbamol (ROBAXIN) 500 MG tablet Take 1 tablet (500 mg total) by mouth every 8 (eight) hours as needed for muscle spasms. 01/26/16  Yes Almond Lint, MD  Multiple Vitamin (MULTIVITAMIN) tablet Take 1 tablet by mouth daily.   Yes Historical Provider, MD  nitroGLYCERIN (NITROSTAT) 0.4 MG SL tablet Place 1 tablet (0.4 mg total) under the tongue every 5 (five) minutes as needed for chest pain (MAX 3 TABLETS). 05/19/15  Yes Cassell Clement, MD  ondansetron (ZOFRAN-ODT) 4 MG disintegrating tablet Take 1 tablet (4 mg total) by mouth every 6 (six) hours as needed for nausea. 01/26/16  Yes Almond Lint, MD  pantoprazole (PROTONIX) 40 MG tablet TAKE 1 TABLET (40 MG TOTAL) BY MOUTH 2 (TWO) TIMES DAILY. 12/11/15  Yes Lucky Cowboy, MD  potassium chloride SA (K-DUR,KLOR-CON) 20 MEQ tablet Take 20 mEq by mouth 2 (two) times daily.   Yes Historical Provider, MD  sertraline (ZOLOFT) 50 MG tablet Take 1 tablet (50 mg total) by mouth daily. For mood 09/23/15  Yes Lucky Cowboy, MD  sucralfate (CARAFATE) 1 GM/10ML suspension Take 10 mLs (1 g total) by mouth 4 (four) times daily -  with meals and at bedtime. 01/26/16  Yes Almond Lint, MD  traMADol (ULTRAM) 50 MG tablet Take 1 tablet (50 mg total) by mouth every 6 (six) hours as needed (pain). Reported on 10/14/2015 01/26/16  Yes Almond Lint, MD  triamcinolone cream (KENALOG) 0.1 % APPLY TO AFFECTED AREA TWICE A DAY 04/09/15  Yes Lucky Cowboy, MD  warfarin (COUMADIN) 2 MG tablet TAKE 1 TABLET DAILY OR AS DIRECTED 01/26/16  Yes Lucky Cowboy, MD  furosemide (LASIX) 40 MG  tablet TAKE 1  TABLET (40 MG TOTAL) BY MOUTH DAILY. 02/07/16   Lucky Cowboy, MD     Social History   Social History  . Marital Status: Widowed    Spouse Name: N/A  . Number of Children: N/A  . Years of Education: N/A   Occupational History  . Not on file.   Social History Main Topics  . Smoking status: Never Smoker   . Smokeless tobacco: Never Used  . Alcohol Use: No  . Drug Use: No  . Sexual Activity: No   Other Topics Concern  . Not on file   Social History Narrative   Lives in [place].  Continues to work.    Family Status  Relation Status Death Age  . Mother Deceased   . Father Deceased   . Maternal Grandmother Deceased   . Maternal Grandfather Deceased   . Paternal Grandmother Deceased   . Paternal Grandfather Deceased    Family History  Problem Relation Age of Onset  . Colon cancer Mother 40  . Heart disease Mother   . Heart disease Father   . Heart attack Father   . Heart disease Maternal Grandmother      ROS:  Full 14 point review of systems complete and found to be negative unless listed above.  Physical Exam: Blood pressure 150/74, pulse 95, temperature 98.4 F (36.9 C), temperature source Oral, resp. rate 18, height 5\' 1"  (1.549 m), weight 134 lb 0.6 oz (60.8 kg), SpO2 100 %.  General: Well developed, well nourished, female in no acute distress Head: Eyes PERRLA, No xanthomas. Normocephalic and atraumatic, oropharynx without edema or exudate.  Lungs: CTA Heart:  Heart irregular rate and rhythm with S1, S2.  II/VI systolic murmur. pulses are 2+ extrem.   Neck: No carotid bruits. No lymphadenopathy.  No JVD. Abdomen: Bowel sounds present, abdomen soft and non-tender without masses or hernias noted. Msk:  No spine or cva tenderness. No weakness, no joint deformities or effusions. Extremities: No clubbing or cyanosis. no edema.  Neuro: Alert and oriented X 3. No focal deficits noted. Psych:  Good affect, responds appropriately Skin: No rashes  or lesions noted.  Labs:   Lab Results  Component Value Date   WBC 7.4 02/09/2016   HGB 11.0* 02/09/2016   HCT 34.4* 02/09/2016   MCV 90.8 02/09/2016   PLT 182 02/09/2016    Recent Labs  02/09/16 0430  INR 1.33    Recent Labs Lab 02/09/16 0430  NA 139  K 4.2  CL 111  CO2 20*  BUN 13  CREATININE 0.82  CALCIUM 8.5*  PROT 5.3*  BILITOT 0.6  ALKPHOS 74  ALT 13*  AST 17  GLUCOSE 113*  ALBUMIN 2.7*   MAGNESIUM  Date Value Ref Range Status  02/09/2016 1.9 1.7 - 2.4 mg/dL Final   Echo: 04/10/2016 - Left ventricle: The cavity size was normal. There was mild  concentric hypertrophy. Systolic function was normal. The  estimated ejection fraction was in the range of 60% to 65%. Wall  motion was normal; there were no regional wall motion  abnormalities. The study was not technically sufficient to allow  evaluation of LV diastolic dysfunction due to atrial  fibrillation. - Aortic valve: Valve mobility was restricted. There was mild to  moderate stenosis. There was moderate regurgitation. Peak  gradient (S): 38 mm Hg. Valve area (VTI): 0.55 cm^2. Valve area  (Vmax): 0.57 cm^2. Valve area (Vmean): 0.59 cm^2. - Aortic root: The aortic root was normal in size. -  Ascending aorta: The ascending aorta was normal in size. - Mitral valve: Calcified annulus. Moderately thickened, severely  calcified leaflets . Mobility was restricted. The findings are  consistent with moderate stenosis. There was mild regurgitation.  Valve area by continuity equation (using LVOT flow): 0.82 cm^2. - Left atrium: The atrium was mildly dilated. - Right ventricle: The cavity size was mildly dilated. Wall  thickness was normal. Systolic function was mildly reduced. - Right atrium: The atrium was mildly dilated. - Tricuspid valve: There was moderate regurgitation. - Pulmonary arteries: Systolic pressure was moderately to severely  increased. PA peak pressure: 55 mm Hg (S). - Inferior  vena cava: The vessel was dilated. The respirophasic  diameter changes were blunted (< 50%), consistent with elevated  central venous pressure. - Pericardium, extracardiac: There was no pericardial effusion.  ECG:  Afib  Radiology:  No results found.  ASSESSMENT AND PLAN:    Principal Problem:   Intractable nausea and vomiting Active Problems:   Hypothyroidism   Essential hypertension   Vitamin B12 deficiency   Adenocarcinoma of stomach (HCC)   Chronic atrial fibrillation ([facility])   Pressure ulcer   Hypernatremia   Hypokalemia   Dehydration   Esophagitis   Gastroesophageal reflux disease with esophagitis   Uncontrollable vomiting   Urinary tract infectious disease  1. Chronic atrial fibrillation: Patient with atrial fibrillation with rapid rate at times. Clinic notes show that her Afib burden was 35% in March of this year, was previously 3% in June 2016. She did not tolerate Amiodarone in the past. Plan was to start flecainide, however patient needed Lexiscan Myoview before initiation but was admitted to hospital before this could occur and was found to have adenocarcinoma of her stomach.   Review of telemetry shows Afib, with rates as high as 130. Rates mostly in the 100's. She is on '5mg'$ /hr of Cardizem. She is also hypertensive, will communicate with nursing to increase Cardizem gtt. Her rapid rate could be due to acute illness. On heparin gtt for anticoagulation for bridge to Coumadin (started yesterday). INR is 1.33.   This patients CHA2DS2-VASc Score and unadjusted Ischemic Stroke Rate (% per year) is equal to 10.8 % stroke rate/year from a score of 8 Above score calculated as 1 point each if present [CHF, HTN, DM, Vascular=MI/PAD/Aortic Plaque, Age if 65-74, or Female], 2 points each if present [Age > 75, or Stroke/TIA/TE].   2. HTN: Patient is hypertensive with SBP in 150-180's. Will be starting metoprolol this afternoon per primary team. This along with uptitration of Cardizem  gtt will hopefully reduce her BP. Will follow.     Signed: Arbutus Leas, NP 02/09/2016 1:52 PM Pager [phone]  Co-Sign MD

## 2016-02-09 NOTE — Progress Notes (Signed)
PROGRESS NOTE                                                                                                                                                                                                             Patient Demographics:    [name], is a 82 y.o. female, DOB - March 08, 1934, [id number]  Admit date - 02/03/2016   Admitting Physician [name], MD  Outpatient Primary MD for the patient is [name], MD  LOS -   Outpatient Specialists: [name]  Chief Complaint  Patient presents with  . Emesis       Brief Narrative   82 year old female with paroxysmal A. fib, CAD with history of CABG, moderate BS, tachycardia-bradycardia syndrome status post pacemaker, GERD, peripheral neuropathy, hypertension, hypothyroidism, CVA with left eye blindness, recently diagnosed adenocarcinoma of the stomach status post gastrectomy done earlier this month by [name] presented to the ED with nausea with multiple episodes of vomiting for 1 day.. Patient reported dysphagia with frequent loose stools.  -Barium esophagogram-- presbyesophagus with contrast passing into the proximal small bowel.  - EGD showing mid-distal third esophagitis without bleeding. Patent surgical anastomosis. Showed mid-distal reflux esophagitis, ,gastric ulcers at the gastric anastomosis with no bleeding stigmata. (Largest measuring 5 mm) Hospital course prolonged due to nausea and vomiting causing inability to take her medications  requiring TNA and rapid A. fib   Subjective:   cardizem drip restarted this morning as pt going back into rapid afib.    Assessment  & Plan :    Principal Problem:   Intractable nausea and vomiting EGD findings as above. -BID  PPI, Reglan stopped as patient developed flushing for face.  Recommend strict antireflux measures with HOB elevation at all times.  -Resume clear liquid on 6/4 and  tolerating well. Advance slowly to bariatric diet if tolerated. -Given severe malnutrition, PICC line placed and started on TNA.  Active Problems:  SIRS Pt febrile with tachycardia. CXR showed increased left lung base densities suggestive of pleural fluid and consolidation.. Urine culture growing enterococcus and Citrobacter. Antibiotic switched to Levaquin.  UTI  Cultures growing Enterobacter and Citrobacter. Now on Levaquin  A. fib with RVR Possibly due to GI symptoms and underlying infection.  Cardizem drip restarted today as rapid A. fib persistent. Cardiology consulted. Continue scheduled beta blocker. Started on  heparin drip as patient was unable to tolerate by mouth. Coumadin resumed.  Uncontrolled hypertension Blood pressure now better with hydralazine, scheduled metoprolol and Cardizem drip.  Adenocarcinoma of stomach ([place]) Status post subtotal gastrectomy recently. Consult [name] as needed.   Hypokalemia/hyponatremia   secondary to dehydration Replenished    Hypothyroidism Continue Synthroid.  CAD with history of CABG IV metoprolol.  Oral thrush Added fluconazole.    Code Status : Full code  Family Communication  : Daughter at bedside  Disposition Plan  : Home once clinically improved.  Barriers For Discharge : rapid A. fib  Consults  :   [facility]  cardiology  Procedures  :  barium esophagogram EGD  DVT Prophylaxis  :  IV heparin bridging for coumadin  Lab Results  Component Value Date   PLT 182 02/09/2016    Antibiotics  :    Anti-infectives    Start     Dose/Rate Route Frequency Ordered Stop   02/07/16 1445  levofloxacin (LEVAQUIN) IVPB 750 mg     750 mg 100 mL/hr over 90 Minutes Intravenous Every 48 hours 02/07/16 1424     02/07/16 0815  fluconazole (DIFLUCAN) IVPB 100 mg     100 mg 50 mL/hr over 60 Minutes Intravenous Every 24 hours 02/07/16 0813     02/06/16 1500  azithromycin (ZITHROMAX) 500 mg in dextrose 5 % 250 mL  IVPB  Status:  Discontinued     500 mg 250 mL/hr over 60 Minutes Intravenous Every 24 hours 02/06/16 1454 02/07/16 1424   02/04/16 1700  cefTRIAXone (ROCEPHIN) 1 g in dextrose 5 % 50 mL IVPB  Status:  Discontinued     1 g 100 mL/hr over 30 Minutes Intravenous Every 24 hours 02/04/16 1553 02/07/16 1424        Objective:   Filed Vitals:   02/09/16 0011 02/09/16 0557 02/09/16 0613 02/09/16 0619  BP: 166/88 161/99 188/76 154/77  Pulse: 112 95 91   Temp: 98.7 F (37.1 C) 98.1 F (36.7 C)    TempSrc: Oral Oral    Resp: 20 18    Height:      Weight:      SpO2: 97% 97%      Wt Readings from Last 3 Encounters:  02/03/16 60.8 kg (134 lb 0.6 oz)  01/17/16 73.846 kg (162 lb 12.8 oz)  01/07/16 65.318 kg (144 lb)     Intake/Output Summary (Last 24 hours) at 02/09/16 1308 Last data filed at 02/09/16 1115  Gross per 24 hour  Intake      0 ml  Output   3600 ml  Net  -3600 ml     Physical Exam  Gen: Not in distress HEENT: no pallor, dry mucosa, supple neck Chest: clear b/l, no added sounds CVS: S1 and S2 irregular regular, no murmurs rubs or gallop GI: soft, NT, ND, BS+ Musculoskeletal: warm, no edema     Data Review:    CBC  Recent Labs Lab 02/04/16 0454 02/05/16 0245 02/06/16 0602 02/07/16 0520 02/08/16 0530 02/09/16 0430  WBC 8.4 8.4 6.7 8.5 9.7 7.4  HGB 12.6 12.1 11.1* 11.3* 11.8* 11.0*  HCT 40.6 39.2 36.6 35.8* 36.5 34.4*  PLT 252 212 186 184 180 182  MCV 92.7 92.7 94.3 90.6 89.9 90.8  MCH 28.8 28.6 28.6 28.6 29.1 29.0  MCHC 31.0 30.9 30.3 31.6 32.3 32.0  RDW 17.7* 17.8* 17.6* 16.8* 17.3* 17.8*  LYMPHSABS 1.3 1.9 1.5  --   --  1.8  MONOABS 0.7 0.5 0.6  --   --  0.7  EOSABS 0.2 0.4 0.4  --   --  0.7  BASOSABS 0.1 0.1 0.0  --   --  0.0    Chemistries   Recent Labs Lab 02/03/16 1655 02/04/16 0454 02/05/16 0245 02/06/16 0602 02/07/16 0520 02/08/16 0530 02/09/16 0430  NA 147* 148* 146* 142 138 140 139  K 3.4* 3.4* 3.5 3.5 2.9* 3.3* 4.2  CL  115* 117* 118* 113* 107 110 111  CO2 21* 22 18* 20* 21* 20* 20*  GLUCOSE 126* 133* 103* 88 118* 120* 113*  BUN 18 17 15 10 8 9 13   CREATININE 1.06* 1.00 0.98 0.98 0.84 0.79 0.82  CALCIUM 9.5 9.4 9.1 8.9 8.7* 8.9 8.5*  MG 1.7  --   --  1.6* 1.8  --  1.9  AST 19 20 24 23   --   --  17  ALT 12* 13* 13* 13*  --   --  13*  ALKPHOS 90 83 80 74  --   --  74  BILITOT 0.7 0.7 0.6 0.9  --   --  0.6   ------------------------------------------------------------------------------------------------------------------  Recent Labs  02/09/16 0430  TRIG 92    Lab Results  Component Value Date   HGBA1C 5.7* 10/09/2015   ------------------------------------------------------------------------------------------------------------------ No results for input(s): TSH, T4TOTAL, T3FREE, THYROIDAB in the last 72 hours.  Invalid input(s): FREET3 ------------------------------------------------------------------------------------------------------------------ No results for input(s): VITAMINB12, FOLATE, FERRITIN, TIBC, IRON, RETICCTPCT in the last 72 hours.  Coagulation profile  Recent Labs Lab 02/05/16 0245 02/06/16 0602 02/07/16 0520 02/08/16 0530 02/09/16 0430  INR 1.91* 1.36 1.34 1.45 1.33    No results for input(s): DDIMER in the last 72 hours.  Cardiac Enzymes No results for input(s): CKMB, TROPONINI, MYOGLOBIN in the last 168 hours.  Invalid input(s): CK ------------------------------------------------------------------------------------------------------------------ No results found for: BNP  Inpatient Medications  Scheduled Meds: . fentaNYL (SUBLIMAZE) injection  12.5 mcg Intravenous Once  . fentaNYL (SUBLIMAZE) injection  25 mcg Intravenous Once  . fluconazole (DIFLUCAN) IV  100 mg Intravenous Q24H  . insulin aspart  0-9 Units Subcutaneous Q6H  . levofloxacin (LEVAQUIN) IV  750 mg Intravenous Q48H  . levothyroxine  25 mcg Intravenous QAC breakfast  . magnesium sulfate 1 - 4 g  bolus IVPB  3 g Intravenous Once  . methocarbamol (ROBAXIN)  IV  500 mg Intravenous Q12H  . pregabalin  75 mg Oral BID  . sodium chloride flush  3 mL Intravenous Q12H  . warfarin  3 mg Oral ONCE-1800  . Warfarin - Pharmacist Dosing Inpatient   Does not apply q1800   Continuous Infusions: . diltiazem (CARDIZEM) infusion 5 mg/hr (02/09/16 1219)  . Marland KitchenTPN (CLINIMIX-E) Adult 70 mL/hr at 02/08/16 1749   And  . fat emulsion 240 mL (02/08/16 1749)  . Marland KitchenTPN (CLINIMIX-E) Adult     And  . fat emulsion    . heparin 1,200 Units/hr (02/08/16 1537)   PRN Meds:.acetaminophen, diphenhydrAMINE, hydrALAZINE, prochlorperazine, sodium chloride flush  Micro Results Recent Results (from the past 240 hour(s))  Urine culture     Status: Abnormal   Collection Time: 02/04/16  6:35 PM  Result Value Ref Range Status   Specimen Description URINE, CLEAN CATCH  Final   Special Requests Normal  Final   Culture (A)  Final    50,000 COLONIES/mL ENTEROCOCCUS SPECIES 50,000 COLONIES/mL CITROBACTER FREUNDII    Report Status 02/07/2016 FINAL  Final   Organism ID, Bacteria ENTEROCOCCUS SPECIES (A)  Final   Organism ID, Bacteria CITROBACTER FREUNDII (A)  Final      Susceptibility   Citrobacter freundii - MIC*    CEFAZOLIN >=64 RESISTANT Resistant     CEFTRIAXONE <=1 SENSITIVE Sensitive     CIPROFLOXACIN <=0.25 SENSITIVE Sensitive     GENTAMICIN <=1 SENSITIVE Sensitive     IMIPENEM 0.5 SENSITIVE Sensitive     NITROFURANTOIN <=16 SENSITIVE Sensitive     TRIMETH/SULFA <=20 SENSITIVE Sensitive     PIP/TAZO <=4 SENSITIVE Sensitive     * 50,000 COLONIES/mL CITROBACTER FREUNDII   Enterococcus species - MIC*    AMPICILLIN <=2 SENSITIVE Sensitive     LEVOFLOXACIN 1 SENSITIVE Sensitive     NITROFURANTOIN <=16 SENSITIVE Sensitive     VANCOMYCIN 1 SENSITIVE Sensitive     * 50,000 COLONIES/mL ENTEROCOCCUS SPECIES  Culture, blood (routine x 2)     Status: None (Preliminary result)   Collection Time: 02/06/16  8:15 AM    Result Value Ref Range Status   Specimen Description BLOOD RIGHT ANTECUBITAL  Final   Special Requests BOTTLES DRAWN AEROBIC ONLY 8CC  Final   Culture NO GROWTH 3 DAYS  Final   Report Status PENDING  Incomplete  Culture, blood (routine x 2)     Status: None (Preliminary result)   Collection Time: 02/06/16  8:25 AM  Result Value Ref Range Status   Specimen Description BLOOD LEFT HAND  Final   Special Requests IN PEDIATRIC BOTTLE 3CC  Final   Culture NO GROWTH 3 DAYS  Final   Report Status PENDING  Incomplete  C difficile quick scan w PCR reflex     Status: None   Collection Time: 02/07/16  6:07 PM  Result Value Ref Range Status   C Diff antigen NEGATIVE NEGATIVE Final   C Diff toxin NEGATIVE NEGATIVE Final   C Diff interpretation Negative for toxigenic C. difficile  Final    Radiology Reports Dg Esophagus  02/04/2016  CLINICAL DATA:  Vomiting for 2 days 3 weeks status post partial gastrectomy. Evaluate for food impaction. EXAM: ESOPHOGRAM/BARIUM SWALLOW TECHNIQUE: Single contrast examination was performed using  thin barium. FLUOROSCOPY TIME:  Radiation Exposure Index (as provided by the fluoroscopic device): 238.9 uGy*m2 If the device does not provide the exposure index: Fluoroscopy Time:  54 seconds Number of Acquired Images:  Fluoro only. COMPARISON:  Postoperative upper GI series 01/23/2016. FINDINGS: Study was performed in the LPO and right lateral decubitus positions. The patient swallowed the barium without difficulty. There is mild presbyesophagus. No evidence of food impaction, mucosal ulceration or stricture. Contrast empties into the gastric pouch and proximal small bowel. No evidence of contrast leak. No laryngeal penetration observed. IMPRESSION: No evidence of food impaction, obstruction or extravasation. Presbyesophagus. Electronically Signed   By: Richardean Sale M.D.   On: 02/04/2016 08:50   Dg Chest Port 1 View  02/06/2016  CLINICAL DATA:  PICC line placement EXAM: PORTABLE  CHEST 1 VIEW COMPARISON:  02/06/2015 FINDINGS: LEFT-sided PICC line placed with tip distal SVC. Stable cardiac silhouette. Small LEFT effusion. Mild central venous pulmonary congestion. No pneumothorax IMPRESSION: 1. PICC line in proper position. 2. LEFT effusion insert venous congestion. Electronically Signed   By: Suzy Bouchard M.D.   On: 02/06/2016 16:57   Dg Chest Port 1 View  02/06/2016  CLINICAL DATA:  Coughing.  Fever and rule out pneumonia. EXAM: PORTABLE CHEST 1 VIEW COMPARISON:  02/03/2016.  PET-CT 12/01/2015 FINDINGS: Stable position of the right dual chamber cardiac pacemaker. New or  increased densities at the left lung base. Right lung remains clear. Heart size is grossly stable and within normal limits. Atherosclerotic calcifications at the aortic arch. No acute bone abnormality. IMPRESSION: Increased densities at the left lung base are suggestive for pleural fluid and consolidation. Electronically Signed   By: Markus Daft M.D.   On: 02/06/2016 11:45   Dg Abd Acute W/chest  02/03/2016  CLINICAL DATA:  Vomiting after stomach surgery 01/15/2016. Partial gastrectomy for cancer. EXAM: DG ABDOMEN ACUTE W/ 1V CHEST COMPARISON:  11/12/2015 abdominal CT FINDINGS: Sutures in the left upper quadrant correlating with partial gastrectomy. Study is limited by gasless abdomen. No dilated loops are seen. No evidence of pneumoperitoneum. Trace left effusion. No pneumonia or edema. No cardiomegaly. Dual-chamber pacer from the right with leads in unremarkable position. IMPRESSION: 1. Gasless abdomen, a nonspecific finding that limits sensitivity. No indication of bowel obstruction or perforation. 2. Trace left effusion. Electronically Signed   By: Monte Fantasia M.D.   On: 02/03/2016 18:21   Dg Duanne Limerick W/water Sol Cm  01/23/2016  CLINICAL DATA:  Status post partial gastrectomy.  Evaluate for leak. EXAM: WATER SOLUBLE UPPER GI SERIES TECHNIQUE: Single-column upper GI series was performed using water soluble  contrast. CONTRAST:  1 ISOVUE-300 IOPAMIDOL (ISOVUE-300) INJECTION 61% COMPARISON:  01/18/2016 FLUOROSCOPY TIME:  Radiation Exposure Index (as provided by the fluoroscopic device): Not available If the device does not provide the exposure index: Fluoroscopy Time (in minutes and seconds):  1 minutes 18 seconds Number of Acquired Images:  1, the rest saved fluoroscopy FINDINGS: Scout images over the chest epigastrium were obtained. Patient had to take swallows LPO. In this position there was no emptying of the remnant stomach with frequent large volume gastroesophageal reflux. Edema and obstruction at the gastro enterostomy with expected, but when supine and RPO the stomach drained readily. Negative for leak. The duodenal C-loop opacified and is unremarkable. IMPRESSION: 1. Negative for postoperative leak or obstruction. 2. Positional drainage of the remnant stomach. When LPO there was no drainage and large volume, symptomatic gastroesophageal reflux. 3. Distal esophagitis related to #2 or recent NG tube. Electronically Signed   By: Monte Fantasia M.D.   On: 01/23/2016 09:49   Dg Duanne Limerick W/water Sol Cm  01/18/2016  CLINICAL DATA:  82 year old female status post partial gastrectomy. Evaluate for potential postoperative leak or obstruction. EXAM: WATER SOLUBLE UPPER GI SERIES TECHNIQUE: Single-column upper GI series was performed using water soluble contrast. CONTRAST:  60 mL of Gastrografin COMPARISON:  No priors. FLUOROSCOPY TIME:  If the device does not provide the exposure index: Fluoroscopy Time (in minutes and seconds):  1 minutes and 18 seconds Number of Acquired Images:  5 series with multiple images. FINDINGS: Initial image demonstrated a nasogastric a tube in place with tip in the gastric remnant and side-port in the distal esophagus. Midline skin staples are noted. Pacemaker leads extending over the expected location of the right atrial appendage and right ventricular apex. Gastrografin was infused via the  patient's indwelling NG tube, which demonstrated a small gastric remnant. There was gastroesophageal reflux into the esophagus throughout the examination. Contrast readily traversed the pylorus into the proximal small bowel. There was no extravasation of contrast material at any point during the examination. IMPRESSION: 1. Expected postoperative appearance of the stomach following partial gastrectomy, without evidence of postoperative leak or obstruction. These results will be called to the ordering clinician or representative by the Radiologist Assistant, and communication documented in the PACS or zVision Dashboard. Electronically Signed  By: [name] M.D.   On: 01/18/2016 15:59    Time Spent in minutes  35   Louellen Molder M.D on 02/09/2016 at 1:08 PM  Between 7am to 7pm - Pager - [phone]  After 7pm go to [email or web address] - password [facility]  [facility] -  Office  [phone]

## 2016-02-09 NOTE — Progress Notes (Signed)
          Daily Rounding Note  02/09/2016, 8:16 AM  LOS: 3 days   SUBJECTIVE:       Nausea, regurgitation improved.  None today.  Tolerating slow consumption of clears  OBJECTIVE:         Vital signs in last 24 hours:    Temp:  [98 F (36.7 C)-98.7 F (37.1 C)] 98.1 F (36.7 C) (06/05 0557) Pulse Rate:  [75-112] 91 (06/05 0613) Resp:  [18-20] 18 (06/05 0557) BP: (153-188)/(76-99) 154/77 mmHg (06/05 0619) SpO2:  [97 %-99 %] 97 % (06/05 0557) Last BM Date: 02/08/16 Filed Weights   02/03/16 1634 02/03/16 2100  Weight: 59.875 kg (132 lb) 60.8 kg (134 lb 0.6 oz)   General: pleasant, comfortable.  Looks better   Heart:  Afib in 1teens.   Chest: fine crackles in bases.  No dyspnea or cough Abdomen: soft, NT, BS present but decreased  Extremities: some edema in hands Neuro/Psych:  No cogwheeling or tremors.        Lab Results:  Recent Labs  02/07/16 0520 02/08/16 0530 02/09/16 0430  WBC 8.5 9.7 7.4  HGB 11.3* 11.8* 11.0*  HCT 35.8* 36.5 34.4*  PLT 184 180 182   BMET  Recent Labs  02/07/16 0520 02/08/16 0530 02/09/16 0430  NA 138 140 139  K 2.9* 3.3* 4.2  CL 107 110 111  CO2 21* 20* 20*  GLUCOSE 118* 120* 113*  BUN 8 9 13   CREATININE 0.84 0.79 0.82  CALCIUM 8.7* 8.9 8.5*   LFT  Recent Labs  02/09/16 0430  PROT 5.3*  ALBUMIN 2.7*  AST 17  ALT 13*  ALKPHOS 74  BILITOT 0.6   PT/INR  Recent Labs  02/08/16 0530 02/09/16 0430  LABPROT 17.7* 16.6*  INR 1.45 1.33      ASSESMENT:   * Gastric adenocarcinoma by 11/11/15 EGD. S/p 01/15/16 subtotal gastrectomy. Delayed gastric emptying/gastroparesis on 2008 emptying study.  Persistent dysphagia, regurgitation since surgery.  Radiology studies: mild presbyesophagus.  6/1 EGD: grade D esophagitis, 5 cm gastric remnant, 2 non-bleeding anastomotic ulcers (largest 5 mm) .  Protonix BID, Reglan added. Hospitalist added Diflucan for oral thrush 6/3.    Felling better in last 24 hours.   * PAF, hx retinal artery occlusion. RVR over the weekend. PTA chronic Coumadin, now on IV Heparin. INR 3.2.   * Anemia. Started on parenteral iron, Aranesp and B12 injections during 11/2015 admission   *  PMN.  Prealbumin 14.1 today (18.1 6/2).  On TNA.    * Pyuria. UTI.growing 50 K enterococcus and citrobacter.    * Fever and tachycardia. Pulmonary fluid, and consolidation on xray.     PLAN   *  Bariatric full liquid diet.      Azucena Freed  02/09/2016, 8:16 AM Pager: [phone]    [facility] Attending   I have taken an interval history, reviewed the chart and examined the patient. I agree with the Advanced Practitioner's note, impression and recommendations.   She has post-gastrectomy dysfunction with nausea and vomiting and erosive esophagitis.  She is improving with current Tx of acid suppression and metaclopramide.  Will continue and see her again tomorrow.  [name], MD, [facility] [phone] (pager) [phone] after 5 PM, weekends and holidays  02/09/2016 6:26 PM

## 2016-02-09 NOTE — Progress Notes (Signed)
PARENTERAL NUTRITION CONSULT NOTE -  FOLLOW UP  Pharmacy Consult:  TPN Indication:  Intractable nausea and vomiting  Patient Measurements: Height: 5\' 1"  (154.9 cm) Weight: 134 lb 0.6 oz (60.8 kg) IBW/kg (Calculated) : 47.8  Medical History: Past Medical History  Diagnosis Date  . PAF (paroxysmal atrial fibrillation) ([place])     controlled with amiodarone, on coumadin  . CAD (coronary artery disease)   . Aortic root dilatation ([place])   . Moderate aortic stenosis   . Chronic anticoagulation   . Tachycardia-bradycardia syndrome ([place])     s/p PPM by Dr Doreatha Lew (MDT) 07/03/10  . GERD (gastroesophageal reflux disease)   . Pacemaker     2012  . Neuropathy ([place])     feet/legs  . Restless leg syndrome   . Headache(784.0)     hx migraines - takes Zoloft for migraines  . History of uterine cancer 1980s    treated with hysterectomy, external radiation and radiation seed implants.   . Cancer ([place])     hx Kidney cancer / hx of endometrial cancer  . Anxiety   . Heart murmur   . HTN (hypertension)   . Hyperlipemia   . Hypothyroidism   . Osteomyelitis ([place]) as child  . Family history of anesthesia complication     daughter has ponv  . Syncope 05/28/2014  . Elevated LFTs     hepatic steatosis  . Gastroparesis   . Anal fissure   . Radiation proctitis   . Neuromuscular disorder ([facility])     neuropathy in right leg  . Adenocarcinoma of stomach ([place]) 11/11/2015    gastric mass on egd  . PONV (postoperative nausea and vomiting)     30 yrs ago since then no problem  . CHF (congestive heart failure) ([place])   . Pneumonia     hx  . Chronic kidney disease     only has one kidney rt lft rem 03 ca  . 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 Anemia     hx 3/17 iron infusion, on aranesp and injected B12 since 11/2015.   . Stroke ([place]) 15    partial stroke left legally blind  . Nausea 01/2016  . Transfusion of blood product refused for religious reason     Insulin Requirements in the past 24 hours:  2  units of sensitive SSI  Assessment: 21 YOF with history of recent diagnosis of adenocarcinoma of the stomach and is s/p gastrectomy earlier this month.  Patient presented with intractable nausea and vomiting.  Per family, patient has struggled with poor PO intake for the past week and a half and has now been unable to keep anything down for the past 2 days.  She suffered about a 10 lb weight loss over last 1-2 weeks.  Pharmacy consulted to mange TPN for intractable nausea and vomiting.  GI: GERD / anal fissure / gastric cancer.  EGD showed esophagitis, gastric ulcer at gastric anastomosis.  Prealbumin slightly low at 14.1, BM x3.  Surgery keeping on sips of clears d/t persistent nausea.  H2RA in TPN Endo: hypothyroid on IV Synthroid.  No hx DM - CBGs controlled with minimal SSI use Lytes: all WNL except mildly elevated Phos (Ca x Phos = 46, goal < 55) Renal: SCr stable, CrCL 47 ml/min, BUN WNL - good UOP 2.4 ml/kg/hr Pulm: stable on RA Cards: PAF / CAD / AS / tachy-brady syndrome post PPM / HTN / HLD / CHF, new Afib - BP/HR elevated - diltiazem Hepatobil:  LFTs / tbili / TG WNL. Heme/Onc: hx uterine/kidney/gastric cancer - mild anemia, plts WNL AC: Heparin/Coumadin for Afib, HL therapeutic, INR below goal Neuro: neuropathy / RLS / migraine / anxiety / hx CVA - Robaxin BID, Lyrica ID: LVQ D#3 (6/3 >>) for Enterococal + Citrobacter UTI and possible PNA + Fluc D#3 (6/3 >> ) for oral thrush - afebrile, WBC WNL Best Practices: heparin gtt TPN Access: PICC double lumen placed 02/06/16 TPN start date: 02/06/16  Current Nutrition:  Bariatric clear liquid - minimal intake TPN  Nutritional Goals:  1600-1800 kCal and 75-90 gm protein per day   Plan:  - Continue Clinimix E 5/15 at 70 ml/hr + lipids at 10 ml/hr.  TPN provides 1672 kCal and 84 gm protein per day, meeting 100% of patient's needs. - Daily multivitamin and trace elements in TPN - Pepcid 20mg  daily in TPN (d/c bolus) - Continue sensitive  SSI Q6H.  Likely d/c in AM. - F/U PO intake and diet advancement to start weaning TPN. - Mag sulfate 3gm IV x 1 - Watch Phos   Mariacristina Aday D. Mina Marble, PharmD, BCPS Pager:  [phone] 02/09/2016, 8:14 AM

## 2016-02-09 NOTE — Progress Notes (Signed)
[facility]  Patient Status:   New pt for [facility] this hospital admission  [facility] is providing the following services: HHRN and Home TPN team to provide TPN for pt at home if ordered at DC.  [facility] hospital infusion coordinator met with the pt and both daughters, [name] and Peter Congo, and they are very supportive and capable to administer the TPN at home for their mother. I will provide ongoing hands on teaching with the daughters to support transition to home when MD team orders.   If patient discharges after hours, please call [phone].   [name] 02/09/2016, 9:39 AM

## 2016-02-09 NOTE — Progress Notes (Signed)
Patient ID: [name], female   DOB: 1934-01-09, 82 y.o.   MRN: [id number] 4 Days Post-Op  Subjective: Swallowing better, but still having issues with atrial fibrillation  Objective: Vital signs in last 24 hours: Temp:  [98 F (36.7 C)-98.7 F (37.1 C)] 98.1 F (36.7 C) (06/05 0557) Pulse Rate:  [75-112] 91 (06/05 0613) Resp:  [18-20] 18 (06/05 0557) BP: (153-188)/(76-99) 154/77 mmHg (06/05 0619) SpO2:  [97 %-99 %] 97 % (06/05 0557) Last BM Date: 02/09/16  Intake/Output from previous day: 06/04 0701 - 06/05 0700 In: -  Out: 3450 [Urine:3450] Intake/Output this shift:    General appearance: Alert and pleasant.  No obvious distress.  Feeble and deconditioned.  Family in room.  Alert and oriented. Resp: clear to auscultation bilaterally GI: Abdomen soft and nontender.  Incisions well-healed.  Lab Results:   Recent Labs  02/08/16 0530 02/09/16 0430  WBC 9.7 7.4  HGB 11.8* 11.0*  HCT 36.5 34.4*  PLT 180 182   BMET  Recent Labs  02/08/16 0530 02/09/16 0430  NA 140 139  K 3.3* 4.2  CL 110 111  CO2 20* 20*  GLUCOSE 120* 113*  BUN 9 13  CREATININE 0.79 0.82  CALCIUM 8.9 8.5*   PT/INR  Recent Labs  02/08/16 0530 02/09/16 0430  LABPROT 17.7* 16.6*  INR 1.45 1.33   ABG No results for input(s): PHART, HCO3 in the last 72 hours.  Invalid input(s): PCO2, PO2  Studies/Results: No results found.  Anti-infectives: Anti-infectives    Start     Dose/Rate Route Frequency Ordered Stop   02/07/16 1445  levofloxacin (LEVAQUIN) IVPB 750 mg     750 mg 100 mL/hr over 90 Minutes Intravenous Every 48 hours 02/07/16 1424     02/07/16 0815  fluconazole (DIFLUCAN) IVPB 100 mg     100 mg 50 mL/hr over 60 Minutes Intravenous Every 24 hours 02/07/16 0813     02/06/16 1500  azithromycin (ZITHROMAX) 500 mg in dextrose 5 % 250 mL IVPB  Status:  Discontinued     500 mg 250 mL/hr over 60 Minutes Intravenous Every 24 hours 02/06/16 1454 02/07/16 1424   02/04/16 1700   cefTRIAXone (ROCEPHIN) 1 g in dextrose 5 % 50 mL IVPB  Status:  Discontinued     1 g 100 mL/hr over 30 Minutes Intravenous Every 24 hours 02/04/16 1553 02/07/16 1424      Assessment/Plan: s/p Procedure(s): ESOPHAGOGASTRODUODENOSCOPY (EGD)  Adenocarcinoma of stomach, status post subtotal gastrectomy 01/15/2016 Esophagitis -  on twice a day Protonix. Reglan added Stay on liquids only for 2 weeks TNA for protein calorie malnutrition  Diarrhea. Family reports this is somewhat chronic due to radiation therapy.  C. difficile negative.  CAD. History CABG. Pacemaker. Chronic atrial fibrillation, now with RVR on heparin drip.  Hypokalemia. resolved  UTI - on levaquin.  Cultures show enterococcus and Citrobacter.  Appears sensitive to quinolones.  Thrush - on diflucan   LOS: 3 days    [name] 02/09/2016

## 2016-02-10 LAB — PROTIME-INR
INR: 1.33 (ref 0.00–1.49)
PROTHROMBIN TIME: 16.6 s — AB (ref 11.6–15.2)

## 2016-02-10 LAB — BASIC METABOLIC PANEL
ANION GAP: 9 (ref 5–15)
BUN: 17 mg/dL (ref 6–20)
CALCIUM: 9.1 mg/dL (ref 8.9–10.3)
CO2: 22 mmol/L (ref 22–32)
Chloride: 108 mmol/L (ref 101–111)
Creatinine, Ser: 0.79 mg/dL (ref 0.44–1.00)
Glucose, Bld: 104 mg/dL — ABNORMAL HIGH (ref 65–99)
POTASSIUM: 3.9 mmol/L (ref 3.5–5.1)
SODIUM: 139 mmol/L (ref 135–145)

## 2016-02-10 LAB — CBC
HCT: 34.8 % — ABNORMAL LOW (ref 36.0–46.0)
Hemoglobin: 11.5 g/dL — ABNORMAL LOW (ref 12.0–15.0)
MCH: 29.6 pg (ref 26.0–34.0)
MCHC: 33 g/dL (ref 30.0–36.0)
MCV: 89.7 fL (ref 78.0–100.0)
PLATELETS: 181 10*3/uL (ref 150–400)
RBC: 3.88 MIL/uL (ref 3.87–5.11)
RDW: 18.2 % — AB (ref 11.5–15.5)
WBC: 8 10*3/uL (ref 4.0–10.5)

## 2016-02-10 LAB — HEPARIN LEVEL (UNFRACTIONATED): HEPARIN UNFRACTIONATED: 0.46 [IU]/mL (ref 0.30–0.70)

## 2016-02-10 LAB — GLUCOSE, CAPILLARY: Glucose-Capillary: 126 mg/dL — ABNORMAL HIGH (ref 65–99)

## 2016-02-10 LAB — PHOSPHORUS: PHOSPHORUS: 4.6 mg/dL (ref 2.5–4.6)

## 2016-02-10 LAB — MAGNESIUM: MAGNESIUM: 2 mg/dL (ref 1.7–2.4)

## 2016-02-10 MED ORDER — FAT EMULSION 20 % IV EMUL
240.0000 mL | INTRAVENOUS | Status: AC
Start: 2016-02-10 — End: 2016-02-11
  Administered 2016-02-10: 240 mL via INTRAVENOUS
  Filled 2016-02-10: qty 250

## 2016-02-10 MED ORDER — TRACE MINERALS CR-CU-MN-SE-ZN 10-1000-500-60 MCG/ML IV SOLN
INTRAVENOUS | Status: AC
  Administered 2016-02-10: 17:00:00 via INTRAVENOUS
  Filled 2016-02-10: qty 1680

## 2016-02-10 MED ORDER — PANTOPRAZOLE SODIUM 40 MG PO TBEC
40.0000 mg | DELAYED_RELEASE_TABLET | Freq: Two times a day (BID) | ORAL | Status: DC
  Administered 2016-02-10 – 2016-02-13 (×7): 40 mg via ORAL
  Filled 2016-02-10 (×7): qty 1

## 2016-02-10 MED ORDER — WARFARIN SODIUM 5 MG PO TABS
5.0000 mg | ORAL_TABLET | Freq: Once | ORAL | Status: AC
  Administered 2016-02-10: 5 mg via ORAL
  Filled 2016-02-10: qty 1

## 2016-02-10 MED ORDER — DILTIAZEM HCL 30 MG PO TABS
30.0000 mg | ORAL_TABLET | Freq: Four times a day (QID) | ORAL | Status: DC
  Administered 2016-02-10 – 2016-02-11 (×5): 30 mg via ORAL
  Filled 2016-02-10 (×5): qty 1

## 2016-02-10 MED ORDER — LEVOTHYROXINE SODIUM 50 MCG PO TABS
50.0000 ug | ORAL_TABLET | Freq: Every day | ORAL | Status: DC
  Administered 2016-02-11 – 2016-02-13 (×3): 50 ug via ORAL
  Filled 2016-02-10 (×3): qty 1

## 2016-02-10 NOTE — Progress Notes (Addendum)
PROGRESS NOTE                                                                                                                                                                                                             Patient Demographics:    [name], is a 82 y.o. female, DOB - 1933-09-10, [id number]  Admit date - 02/03/2016   Admitting Physician [name], MD  Outpatient Primary MD for the patient is [name], MD  LOS -   Outpatient Specialists: [name] Dr [name] ( cardiology)  Chief Complaint  Patient presents with  . Emesis       Brief Narrative   82 year old female with paroxysmal A. fib, CAD with history of CABG, moderate BS, tachycardia-bradycardia syndrome status post pacemaker, GERD, peripheral neuropathy, hypertension, hypothyroidism, CVA with left eye blindness, recently diagnosed adenocarcinoma of the stomach status post gastrectomy done earlier this month by [name] presented to the ED with nausea with multiple episodes of vomiting for 1 day.. Patient reported dysphagia with frequent loose stools.  -Barium esophagogram-- presbyesophagus with contrast passing into the proximal small bowel.  - EGD showing mid-distal third esophagitis without bleeding. Patent surgical anastomosis. Showed mid-distal reflux esophagitis, ,gastric ulcers at the gastric anastomosis with no bleeding stigmata. (Largest measuring 5 mm) Hospital course prolonged due to nausea and vomiting causing inability to take her medications  requiring TNA and rapid A. fib   Subjective:   cardizem drip restarted this morning as pt going back into rapid afib.    Assessment  & Plan :    Principal Problem:   Intractable nausea and vomiting EGD findings as above. -BID  PPI, getting H2 blocker with TNA. Reglan stopped as patient developed flushing for face.  Recommend strict antireflux measures with HOB  elevation at all times.  -diet advanced to  bariatric diet and tolerating well . -Given severe malnutrition, PICC line placed and started on TNA.  Active Problems:   A. fib with RVR Possibly due to GI symptoms and underlying infection.   Placed on Cardizem drip and being titrated down. Rate controlled on low dose. Cardiology following. Plan on starting flecainide and DC cardioversion once INR therapeutic. Started on heparin drip as patient was unable to tolerate by mouth. Coumadin resumed.  SIRS Pt febrile with tachycardia. CXR showed increased left lung base  densities suggestive of pleural fluid and consolidation.. Urine culture growing enterococcus and Citrobacter. Antibiotic switched to Levaquin.  UTI  Cultures growing Enterobacter and Citrobacter. Now on Levaquin  Uncontrolled hypertension Blood pressure now better with hydralazine, metoprolol and Cardizem drip.  Adenocarcinoma of stomach ([place]) Status post subtotal gastrectomy recently. Dr Barry Dienes started her on TNA and will follow up as outpt in few weeks. Recommends to stay on liquid only for 2 weeks.   Hypokalemia/hyponatremia   secondary to dehydration Replenished    Hypothyroidism Continue Synthroid.  CAD with history of CABG On metoprolol  Oral thrush Added fluconazole.improving  Restless leg syndrome Resume Lyrica. Also on Robaxin for back spasms.    Code Status : Full code  Family Communication  : Daughter at bedside  Disposition Plan  : Home once clinically improved.  Barriers For Discharge :rapid afib, sub therapeutic INR  Consults  :   [facility]  cardiology  Procedures  :  barium esophagogram EGD  DVT Prophylaxis  :  IV heparin bridging for coumadin  Lab Results  Component Value Date   PLT 181 02/10/2016    Antibiotics  :    Anti-infectives    Start     Dose/Rate Route Frequency Ordered Stop   02/07/16 1445  levofloxacin (LEVAQUIN) IVPB 750 mg     750 mg 100 mL/hr over 90  Minutes Intravenous Every 48 hours 02/07/16 1424     02/07/16 0815  fluconazole (DIFLUCAN) IVPB 100 mg     100 mg 50 mL/hr over 60 Minutes Intravenous Every 24 hours 02/07/16 0813     02/06/16 1500  azithromycin (ZITHROMAX) 500 mg in dextrose 5 % 250 mL IVPB  Status:  Discontinued     500 mg 250 mL/hr over 60 Minutes Intravenous Every 24 hours 02/06/16 1454 02/07/16 1424   02/04/16 1700  cefTRIAXone (ROCEPHIN) 1 g in dextrose 5 % 50 mL IVPB  Status:  Discontinued     1 g 100 mL/hr over 30 Minutes Intravenous Every 24 hours 02/04/16 1553 02/07/16 1424        Objective:   Filed Vitals:   02/09/16 2026 02/10/16 0545 02/10/16 0951 02/10/16 1054  BP: 147/65 144/56 162/75 153/65  Pulse: 80 98 87 64  Temp: 98.7 F (37.1 C) 98.3 F (36.8 C)    TempSrc: Oral Oral    Resp: 18 18    Height:      Weight:      SpO2: 100% 98%      Wt Readings from Last 3 Encounters:  02/03/16 60.8 kg (134 lb 0.6 oz)  01/17/16 73.846 kg (162 lb 12.8 oz)  01/07/16 65.318 kg (144 lb)     Intake/Output Summary (Last 24 hours) at 02/10/16 1204 Last data filed at 02/10/16 0820  Gross per 24 hour  Intake     10 ml  Output   3100 ml  Net  -3090 ml     Physical Exam  Gen: Not in distress HEENT: no pallor, dry mucosa, supple neck Chest: clear b/l, no added sounds CVS: S1 and S2 irregular regular, no murmurs rubs or gallop GI: soft, NT, ND, BS+ Musculoskeletal: warm, no edema CNS: Alert and oriented     Data Review:    CBC  Recent Labs Lab 02/04/16 0454 02/05/16 0245 02/06/16 0602 02/07/16 0520 02/08/16 0530 02/09/16 0430 02/10/16 0440  WBC 8.4 8.4 6.7 8.5 9.7 7.4 8.0  HGB 12.6 12.1 11.1* 11.3* 11.8* 11.0* 11.5*  HCT 40.6 39.2 36.6 35.8*  36.5 34.4* 34.8*  PLT 252 212 186 184 180 182 181  MCV 92.7 92.7 94.3 90.6 89.9 90.8 89.7  MCH 28.8 28.6 28.6 28.6 29.1 29.0 29.6  MCHC 31.0 30.9 30.3 31.6 32.3 32.0 33.0  RDW 17.7* 17.8* 17.6* 16.8* 17.3* 17.8* 18.2*  LYMPHSABS 1.3 1.9 1.5  --    --  1.8  --   MONOABS 0.7 0.5 0.6  --   --  0.7  --   EOSABS 0.2 0.4 0.4  --   --  0.7  --   BASOSABS 0.1 0.1 0.0  --   --  0.0  --     Chemistries   Recent Labs Lab 02/03/16 1655 02/04/16 0454 02/05/16 0245 02/06/16 0602 02/07/16 0520 02/08/16 0530 02/09/16 0430 02/10/16 0440  NA 147* 148* 146* 142 138 140 139 139  K 3.4* 3.4* 3.5 3.5 2.9* 3.3* 4.2 3.9  CL 115* 117* 118* 113* 107 110 111 108  CO2 21* 22 18* 20* 21* 20* 20* 22  GLUCOSE 126* 133* 103* 88 118* 120* 113* 104*  BUN 18 17 15 10 8 9 13 17   CREATININE 1.06* 1.00 0.98 0.98 0.84 0.79 0.82 0.79  CALCIUM 9.5 9.4 9.1 8.9 8.7* 8.9 8.5* 9.1  MG 1.7  --   --  1.6* 1.8  --  1.9 2.0  AST 19 20 24 23   --   --  17  --   ALT 12* 13* 13* 13*  --   --  13*  --   ALKPHOS 90 83 80 74  --   --  74  --   BILITOT 0.7 0.7 0.6 0.9  --   --  0.6  --    ------------------------------------------------------------------------------------------------------------------  Recent Labs  02/09/16 0430  TRIG 92    Lab Results  Component Value Date   HGBA1C 5.7* 10/09/2015   ------------------------------------------------------------------------------------------------------------------ No results for input(s): TSH, T4TOTAL, T3FREE, THYROIDAB in the last 72 hours.  Invalid input(s): FREET3 ------------------------------------------------------------------------------------------------------------------ No results for input(s): VITAMINB12, FOLATE, FERRITIN, TIBC, IRON, RETICCTPCT in the last 72 hours.  Coagulation profile  Recent Labs Lab 02/06/16 0602 02/07/16 0520 02/08/16 0530 02/09/16 0430 02/10/16 0440  INR 1.36 1.34 1.45 1.33 1.33    No results for input(s): DDIMER in the last 72 hours.  Cardiac Enzymes No results for input(s): CKMB, TROPONINI, MYOGLOBIN in the last 168 hours.  Invalid input(s): CK ------------------------------------------------------------------------------------------------------------------ No  results found for: BNP  Inpatient Medications  Scheduled Meds: . fentaNYL (SUBLIMAZE) injection  12.5 mcg Intravenous Once  . fentaNYL (SUBLIMAZE) injection  25 mcg Intravenous Once  . fluconazole (DIFLUCAN) IV  100 mg Intravenous Q24H  . levofloxacin (LEVAQUIN) IV  750 mg Intravenous Q48H  . levothyroxine  25 mcg Oral QAC breakfast  . metoprolol tartrate  50 mg Oral BID  . pantoprazole  40 mg Oral BID  . pregabalin  75 mg Oral BID  . sodium chloride flush  3 mL Intravenous Q12H  . warfarin  5 mg Oral ONCE-1800  . Warfarin - Pharmacist Dosing Inpatient   Does not apply q1800   Continuous Infusions: . diltiazem (CARDIZEM) infusion 5 mg/hr (02/10/16 0235)  . Marland KitchenTPN (CLINIMIX-E) Adult 70 mL/hr at 02/09/16 1730   And  . fat emulsion 240 mL (02/09/16 1730)  . Marland KitchenTPN (CLINIMIX-E) Adult     And  . fat emulsion    . heparin 1,200 Units/hr (02/09/16 1408)   PRN Meds:.acetaminophen, ALPRAZolam, diphenhydrAMINE, hydrALAZINE, methocarbamol, prochlorperazine, sodium chloride flush  Micro Results  Recent Results (from the past 240 hour(s))  Urine culture     Status: Abnormal   Collection Time: 02/04/16  6:35 PM  Result Value Ref Range Status   Specimen Description URINE, CLEAN CATCH  Final   Special Requests Normal  Final   Culture (A)  Final    50,000 COLONIES/mL ENTEROCOCCUS SPECIES 50,000 COLONIES/mL CITROBACTER FREUNDII    Report Status 02/07/2016 FINAL  Final   Organism ID, Bacteria ENTEROCOCCUS SPECIES (A)  Final   Organism ID, Bacteria CITROBACTER FREUNDII (A)  Final      Susceptibility   Citrobacter freundii - MIC*    CEFAZOLIN >=64 RESISTANT Resistant     CEFTRIAXONE <=1 SENSITIVE Sensitive     CIPROFLOXACIN <=0.25 SENSITIVE Sensitive     GENTAMICIN <=1 SENSITIVE Sensitive     IMIPENEM 0.5 SENSITIVE Sensitive     NITROFURANTOIN <=16 SENSITIVE Sensitive     TRIMETH/SULFA <=20 SENSITIVE Sensitive     PIP/TAZO <=4 SENSITIVE Sensitive     * 50,000 COLONIES/mL CITROBACTER  FREUNDII   Enterococcus species - MIC*    AMPICILLIN <=2 SENSITIVE Sensitive     LEVOFLOXACIN 1 SENSITIVE Sensitive     NITROFURANTOIN <=16 SENSITIVE Sensitive     VANCOMYCIN 1 SENSITIVE Sensitive     * 50,000 COLONIES/mL ENTEROCOCCUS SPECIES  Culture, blood (routine x 2)     Status: None (Preliminary result)   Collection Time: 02/06/16  8:15 AM  Result Value Ref Range Status   Specimen Description BLOOD RIGHT ANTECUBITAL  Final   Special Requests BOTTLES DRAWN AEROBIC ONLY 8CC  Final   Culture NO GROWTH 3 DAYS  Final   Report Status PENDING  Incomplete  Culture, blood (routine x 2)     Status: None (Preliminary result)   Collection Time: 02/06/16  8:25 AM  Result Value Ref Range Status   Specimen Description BLOOD LEFT HAND  Final   Special Requests IN PEDIATRIC BOTTLE 3CC  Final   Culture NO GROWTH 3 DAYS  Final   Report Status PENDING  Incomplete  C difficile quick scan w PCR reflex     Status: None   Collection Time: 02/07/16  6:07 PM  Result Value Ref Range Status   C Diff antigen NEGATIVE NEGATIVE Final   C Diff toxin NEGATIVE NEGATIVE Final   C Diff interpretation Negative for toxigenic C. difficile  Final    Radiology Reports Dg Esophagus  02/04/2016  CLINICAL DATA:  Vomiting for 2 days 3 weeks status post partial gastrectomy. Evaluate for food impaction. EXAM: ESOPHOGRAM/BARIUM SWALLOW TECHNIQUE: Single contrast examination was performed using  thin barium. FLUOROSCOPY TIME:  Radiation Exposure Index (as provided by the fluoroscopic device): 238.9 uGy*m2 If the device does not provide the exposure index: Fluoroscopy Time:  54 seconds Number of Acquired Images:  Fluoro only. COMPARISON:  Postoperative upper GI series 01/23/2016. FINDINGS: Study was performed in the LPO and right lateral decubitus positions. The patient swallowed the barium without difficulty. There is mild presbyesophagus. No evidence of food impaction, mucosal ulceration or stricture. Contrast empties into the  gastric pouch and proximal small bowel. No evidence of contrast leak. No laryngeal penetration observed. IMPRESSION: No evidence of food impaction, obstruction or extravasation. Presbyesophagus. Electronically Signed   By: Richardean Sale M.D.   On: 02/04/2016 08:50   Dg Chest Port 1 View  02/06/2016  CLINICAL DATA:  PICC line placement EXAM: PORTABLE CHEST 1 VIEW COMPARISON:  02/06/2015 FINDINGS: LEFT-sided PICC line placed with tip distal SVC. Stable cardiac silhouette.  Small LEFT effusion. Mild central venous pulmonary congestion. No pneumothorax IMPRESSION: 1. PICC line in proper position. 2. LEFT effusion insert venous congestion. Electronically Signed   By: Suzy Bouchard M.D.   On: 02/06/2016 16:57   Dg Chest Port 1 View  02/06/2016  CLINICAL DATA:  Coughing.  Fever and rule out pneumonia. EXAM: PORTABLE CHEST 1 VIEW COMPARISON:  02/03/2016.  PET-CT 12/01/2015 FINDINGS: Stable position of the right dual chamber cardiac pacemaker. New or increased densities at the left lung base. Right lung remains clear. Heart size is grossly stable and within normal limits. Atherosclerotic calcifications at the aortic arch. No acute bone abnormality. IMPRESSION: Increased densities at the left lung base are suggestive for pleural fluid and consolidation. Electronically Signed   By: Markus Daft M.D.   On: 02/06/2016 11:45   Dg Abd Acute W/chest  02/03/2016  CLINICAL DATA:  Vomiting after stomach surgery 01/15/2016. Partial gastrectomy for cancer. EXAM: DG ABDOMEN ACUTE W/ 1V CHEST COMPARISON:  11/12/2015 abdominal CT FINDINGS: Sutures in the left upper quadrant correlating with partial gastrectomy. Study is limited by gasless abdomen. No dilated loops are seen. No evidence of pneumoperitoneum. Trace left effusion. No pneumonia or edema. No cardiomegaly. Dual-chamber pacer from the right with leads in unremarkable position. IMPRESSION: 1. Gasless abdomen, a nonspecific finding that limits sensitivity. No indication of  bowel obstruction or perforation. 2. Trace left effusion. Electronically Signed   By: Monte Fantasia M.D.   On: 02/03/2016 18:21   Dg Duanne Limerick W/water Sol Cm  01/23/2016  CLINICAL DATA:  Status post partial gastrectomy.  Evaluate for leak. EXAM: WATER SOLUBLE UPPER GI SERIES TECHNIQUE: Single-column upper GI series was performed using water soluble contrast. CONTRAST:  1 ISOVUE-300 IOPAMIDOL (ISOVUE-300) INJECTION 61% COMPARISON:  01/18/2016 FLUOROSCOPY TIME:  Radiation Exposure Index (as provided by the fluoroscopic device): Not available If the device does not provide the exposure index: Fluoroscopy Time (in minutes and seconds):  1 minutes 18 seconds Number of Acquired Images:  1, the rest saved fluoroscopy FINDINGS: Scout images over the chest epigastrium were obtained. Patient had to take swallows LPO. In this position there was no emptying of the remnant stomach with frequent large volume gastroesophageal reflux. Edema and obstruction at the gastro enterostomy with expected, but when supine and RPO the stomach drained readily. Negative for leak. The duodenal C-loop opacified and is unremarkable. IMPRESSION: 1. Negative for postoperative leak or obstruction. 2. Positional drainage of the remnant stomach. When LPO there was no drainage and large volume, symptomatic gastroesophageal reflux. 3. Distal esophagitis related to #2 or recent NG tube. Electronically Signed   By: Monte Fantasia M.D.   On: 01/23/2016 09:49   Dg Duanne Limerick W/water Sol Cm  01/18/2016  CLINICAL DATA:  82 year old female status post partial gastrectomy. Evaluate for potential postoperative leak or obstruction. EXAM: WATER SOLUBLE UPPER GI SERIES TECHNIQUE: Single-column upper GI series was performed using water soluble contrast. CONTRAST:  60 mL of Gastrografin COMPARISON:  No priors. FLUOROSCOPY TIME:  If the device does not provide the exposure index: Fluoroscopy Time (in minutes and seconds):  1 minutes and 18 seconds Number of Acquired  Images:  5 series with multiple images. FINDINGS: Initial image demonstrated a nasogastric a tube in place with tip in the gastric remnant and side-port in the distal esophagus. Midline skin staples are noted. Pacemaker leads extending over the expected location of the right atrial appendage and right ventricular apex. Gastrografin was infused via the patient's indwelling NG tube, which demonstrated a  small gastric remnant. There was gastroesophageal reflux into the esophagus throughout the examination. Contrast readily traversed the pylorus into the proximal small bowel. There was no extravasation of contrast material at any point during the examination. IMPRESSION: 1. Expected postoperative appearance of the stomach following partial gastrectomy, without evidence of postoperative leak or obstruction. These results will be called to the ordering clinician or representative by the Radiologist Assistant, and communication documented in the PACS or zVision Dashboard. Electronically Signed   By: [name] M.D.   On: 01/18/2016 15:59    Time Spent in minutes  25   Louellen Molder M.D on 02/10/2016 at 12:04 PM  Between 7am to 7pm - Pager - [phone]  After 7pm go to [email or web address] - password [facility]  [facility] -  Office  [phone]

## 2016-02-10 NOTE — Progress Notes (Signed)
[facility] for Coumadin/Heparin Indication: atrial fibrillation  Allergies  Allergen Reactions  . Amiodarone      Thyroid and liver and kidney problems  . Codeine Nausea And Vomiting  . Gabapentin Itching  . Hydrocodone Other (See Comments)    hallucination  . Morphine And Related Nausea And Vomiting  . Requip [Ropinirole Hcl]     Headache   . Zinc     nausea    Patient Measurements: Height: 5\' 1"  (154.9 cm) Weight: 134 lb 0.6 oz (60.8 kg) IBW/kg (Calculated) : 47.8  Vital Signs: Temp: 98.3 F (36.8 C) (06/06 0545) Temp Source: Oral (06/06 0545) BP: 144/56 mmHg (06/06 0545) Pulse Rate: 98 (06/06 0545)  Labs:  Recent Labs  02/08/16 0530 02/09/16 0430 02/10/16 0440  HGB 11.8* 11.0* 11.5*  HCT 36.5 34.4* 34.8*  PLT 180 182 181  LABPROT 17.7* 16.6* 16.6*  INR 1.45 1.33 1.33  HEPARINUNFRC 0.46 0.52 0.46  CREATININE 0.79 0.82 0.79    Estimated Creatinine Clearance: 47.7 mL/min (by C-G formula based on Cr of 0.79).   Assessment: 81yof on coumadin pta for afib, admitted 5/30 with N/V. INR 2.84 on admission now s/p INR reversal and EGD and heparin has been restarted. Warfarin restarted 6/4 per Rx.  HL remains therapeutic (0.46), INR 1.33 stable. Hg low stable, plt wnl. No bleed documented. Note on LVQ and fluconazole - INR may be difficult to control.  Last pta dose documented: 3mg  on MWF, 2mg  AOD  Goal of Therapy:  INR 2-3 Monitor platelets by anticoagulation protocol: Yes   Plan:  Heparin at 1200 units/h - dc when INR therapeutic Warfarin 5mg  x 1 dose tonight Daily HL/INR/CBC Monitor s/sx bleeding   Elicia Lamp, PharmD, [facility] Clinical Pharmacist Pager [phone] 02/10/2016 8:50 AM

## 2016-02-10 NOTE — Progress Notes (Signed)
Patient ID: [name], female   DOB: 11/09/1933, 82 y.o.   MRN: [id number] 5 Days Post-Op   Subjective: Still doing OK with soft diet. Pain resolved.    Objective: Vital signs in last 24 hours: Temp:  [98.3 F (36.8 C)-98.7 F (37.1 C)] 98.3 F (36.8 C) (06/06 0545) Pulse Rate:  [64-100] 64 (06/06 1054) Resp:  [18] 18 (06/06 0545) BP: (144-162)/(56-75) 153/65 mmHg (06/06 1054) SpO2:  [98 %-100 %] 98 % (06/06 0545) Last BM Date: 02/10/16  Intake/Output from previous day: 06/05 0701 - 06/06 0700 In: 10 [I.V.:10] Out: 3900 [Urine:3900] Intake/Output this shift: Total I/O In: -  Out: 150 [Urine:150]  General appearance: Alert and pleasant.  No obvious distress. Family in room.  Alert and oriented. Resp: clear to auscultation bilaterally GI: Abdomen soft and nontender.  Incisions well-healed.  Lab Results:   Recent Labs  02/09/16 0430 02/10/16 0440  WBC 7.4 8.0  HGB 11.0* 11.5*  HCT 34.4* 34.8*  PLT 182 181   BMET  Recent Labs  02/09/16 0430 02/10/16 0440  NA 139 139  K 4.2 3.9  CL 111 108  CO2 20* 22  GLUCOSE 113* 104*  BUN 13 17  CREATININE 0.82 0.79  CALCIUM 8.5* 9.1   PT/INR  Recent Labs  02/09/16 0430 02/10/16 0440  LABPROT 16.6* 16.6*  INR 1.33 1.33   ABG No results for input(s): PHART, HCO3 in the last 72 hours.  Invalid input(s): PCO2, PO2  Studies/Results: No results found.  Anti-infectives: Anti-infectives    Start     Dose/Rate Route Frequency Ordered Stop   02/07/16 1445  levofloxacin (LEVAQUIN) IVPB 750 mg     750 mg 100 mL/hr over 90 Minutes Intravenous Every 48 hours 02/07/16 1424     02/07/16 0815  fluconazole (DIFLUCAN) IVPB 100 mg     100 mg 50 mL/hr over 60 Minutes Intravenous Every 24 hours 02/07/16 0813     02/06/16 1500  azithromycin (ZITHROMAX) 500 mg in dextrose 5 % 250 mL IVPB  Status:  Discontinued     500 mg 250 mL/hr over 60 Minutes Intravenous Every 24 hours 02/06/16 1454 02/07/16 1424   02/04/16 1700   cefTRIAXone (ROCEPHIN) 1 g in dextrose 5 % 50 mL IVPB  Status:  Discontinued     1 g 100 mL/hr over 30 Minutes Intravenous Every 24 hours 02/04/16 1553 02/07/16 1424      Assessment/Plan: s/p Procedure(s): ESOPHAGOGASTRODUODENOSCOPY (EGD)  Adenocarcinoma of stomach, status post subtotal gastrectomy 01/15/2016 Esophagitis -  on twice a day Protonix.  Stay on liquids only for 2 weeks TNA for protein calorie malnutrition.  Diarrhea. Family reports this is somewhat chronic due to radiation therapy.  C. difficile negative.  CAD. History CABG. Pacemaker. Chronic atrial fibrillation, now with RVR on heparin drip.  Cards planning TEE.    Hypokalemia. resolved  UTI - on levaquin.  Cultures show enterococcus and Citrobacter.  Appears sensitive to quinolones.  Thrush - on diflucan   LOS: 4 days    [name] 02/10/2016

## 2016-02-10 NOTE — Evaluation (Addendum)
Physical Therapy Evaluation Patient Details Name: [name] MRN: [id number] DOB: 02-17-1934 Today's Date: 02/10/2016   History of Present Illness  Pt readmitted with N/V and found to have esophagitis. Pt with recent gastric cancer now s/p open partial gastrectomy. PMH - CAD, tachy-brady s/p pacer, Afib, Stage 3 CKD, HTN, moderate AoS, hypothyroidism.   Clinical Impression  Pt admitted with above diagnosis and presents to PT with functional limitations due to deficits listed below (See PT problem list). Pt needs skilled PT to maximize independence and safety to allow discharge to home with supportive family. Expect pt will make steady progress and be able to return home when medically ready.     Follow Up Recommendations Home health PT    Equipment Recommendations  None recommended by PT    Recommendations for Other Services       Precautions / Restrictions Precautions Precautions: Fall Restrictions Weight Bearing Restrictions: No      Mobility  Bed Mobility                  Transfers Overall transfer level: Needs assistance Equipment used: Rolling walker (2 wheeled) Transfers: Sit to/from Stand;Stand Pivot Transfers Sit to Stand: Supervision Stand pivot transfers: Supervision       General transfer comment: for safety  Ambulation/Gait Ambulation/Gait assistance: Supervision;Min assist Ambulation Distance (Feet): 175 Feet Assistive device: Rolling walker (2 wheeled) Gait Pattern/deviations: Step-through pattern;Decreased stride length (posterior stagger)   Gait velocity interpretation: <1.8 ft/sec, indicative of risk for recurrent falls General Gait Details: Pt with posterior stagger requiring min A to correct. Pt incontinent of small amounts of stool which was distracting for pt  Stairs            Wheelchair Mobility    Modified Rankin (Stroke Patients Only)       Balance Overall balance assessment: Needs assistance Sitting-balance  support: No upper extremity supported;Feet supported Sitting balance-Leahy Scale: Good     Standing balance support: Bilateral upper extremity supported Standing balance-Leahy Scale: Poor Standing balance comment: walker for support                             Pertinent Vitals/Pain Pain Assessment: No/denies pain    Home Living Family/patient expects to be discharged to:: Private residence Living Arrangements: Children Available Help at Discharge: Family;Available 24 hours/day Type of Home: House Home Access: Stairs to enter Entrance Stairs-Rails: Left Entrance Stairs-Number of Steps: 1 Home Layout: Multi-level Home Equipment: Walker - 2 wheels;Cane - quad;Grab bars - toilet;Grab bars - tub/shower;Wheelchair - manual;Shower seat      Prior Function Level of Independence: Independent with assistive device(s)         Comments: ambulating with walker     Hand Dominance   Dominant Hand: Right    Extremity/Trunk Assessment   Upper Extremity Assessment: Generalized weakness           Lower Extremity Assessment: RLE deficits/detail RLE Deficits / Details: h/o multiple surgeries on R leg after TKA and subsequent infection and tendon repair, limited AROM at knee strength 3-4/5 throughout (with extension lag)       Communication   Communication: HOH  Cognition Arousal/Alertness: Awake/alert Behavior During Therapy: WFL for tasks assessed/performed Overall Cognitive Status: Within Functional Limits for tasks assessed                      General Comments      Exercises  Assessment/Plan    PT Assessment Patient needs continued PT services  PT Diagnosis Generalized weakness;Difficulty walking   PT Problem List Decreased strength;Decreased activity tolerance;Decreased balance;Decreased mobility  PT Treatment Interventions DME instruction;Gait training;Balance training;Functional mobility training;Patient/family education;Therapeutic  activities;Therapeutic exercise   PT Goals (Current goals can be found in the Care Plan section) Acute Rehab PT Goals Patient Stated Goal: go home PT Goal Formulation: With patient/family Time For Goal Achievement: 02/17/16 Potential to Achieve Goals: Good    Frequency Min 3X/week   Barriers to discharge        Co-evaluation               End of Session   Activity Tolerance: Patient tolerated treatment well Patient left: in chair;with call bell/phone within reach;with family/visitor present Nurse Communication: Mobility status         Time: 1435-1454 PT Time Calculation (min) (ACUTE ONLY): 19 min   Charges:   PT Evaluation $PT Eval Low Complexity: 1 Procedure     PT G Codes:        Aadvik Roker 02-27-16, 10:22 PM [facility] PT [phone]

## 2016-02-10 NOTE — Discharge Instructions (Signed)
Information on my medicine - Coumadin   (Warfarin)  This medication education was reviewed with me or my healthcare representative as part of my discharge preparation.  The pharmacist that spoke with me during my hospital stay was:  Romona Curls, [facility]  Why was Coumadin prescribed for you? Coumadin was prescribed for you because you have a blood clot or a medical condition that can cause an increased risk of forming blood clots. Blood clots can cause serious health problems by blocking the flow of blood to the heart, lung, or brain. Coumadin can prevent harmful blood clots from forming. As a reminder your indication for Coumadin is:   Stroke Prevention Because Of Atrial Fibrillation  What test will check on my response to Coumadin? While on Coumadin (warfarin) you will need to have an INR test regularly to ensure that your dose is keeping you in the desired range. The INR (international normalized ratio) number is calculated from the result of the laboratory test called prothrombin time (PT).  If an INR APPOINTMENT HAS NOT ALREADY BEEN MADE FOR YOU please schedule an appointment to have this lab work done by your health care provider within 7 days. Your INR goal is usually a number between:  2 to 3 or your provider may give you a more narrow range like 2-2.5.  Ask your health care provider during an office visit what your goal INR is.  What  do you need to  know  About  COUMADIN? Take Coumadin (warfarin) exactly as prescribed by your healthcare provider about the same time each day.  DO NOT stop taking without talking to the doctor who prescribed the medication.  Stopping without other blood clot prevention medication to take the place of Coumadin may increase your risk of developing a new clot or stroke.  Get refills before you run out.  What do you do if you miss a dose? If you miss a dose, take it as soon as you remember on the same day then continue your regularly scheduled regimen the next  day.  Do not take two doses of Coumadin at the same time.  Important Safety Information A possible side effect of Coumadin (Warfarin) is an increased risk of bleeding. You should call your healthcare provider right away if you experience any of the following: ? Bleeding from an injury or your nose that does not stop. ? Unusual colored urine (red or dark brown) or unusual colored stools (red or black). ? Unusual bruising for unknown reasons. ? A serious fall or if you hit your head (even if there is no bleeding).  Some foods or medicines interact with Coumadin (warfarin) and might alter your response to warfarin. To help avoid this: ? Eat a balanced diet, maintaining a consistent amount of Vitamin K. ? Notify your provider about major diet changes you plan to make. ? Avoid alcohol or limit your intake to 1 drink for women and 2 drinks for men per day. (1 drink is 5 oz. wine, 12 oz. beer, or 1.5 oz. liquor.)  Make sure that ANY health care provider who prescribes medication for you knows that you are taking Coumadin (warfarin).  Also make sure the healthcare provider who is monitoring your Coumadin knows when you have started a new medication including herbals and non-prescription products.  Coumadin (Warfarin)  Major Drug Interactions  Increased Warfarin Effect Decreased Warfarin Effect  Alcohol (large quantities) Antibiotics (esp. Septra/Bactrim, Flagyl, Cipro) Amiodarone (Cordarone) Aspirin (ASA) Cimetidine (Tagamet) Megestrol (Megace) NSAIDs (ibuprofen,  naproxen, etc.) °Piroxicam (Feldene) °Propafenone (Rythmol SR) °Propranolol (Inderal) °Isoniazid (INH) °Posaconazole (Noxafil) Barbiturates (Phenobarbital) °Carbamazepine (Tegretol) °Chlordiazepoxide (Librium) °Cholestyramine (Questran) °Griseofulvin °Oral Contraceptives °Rifampin °Sucralfate (Carafate) °Vitamin K  ° °Coumadin® (Warfarin) Major Herbal Interactions  °Increased Warfarin Effect Decreased Warfarin Effect   °Garlic °Ginseng °Ginkgo biloba Coenzyme Q10 °Green tea °St. John’s wort   ° °Coumadin® (Warfarin) FOOD Interactions  °Eat a consistent number of servings per week of foods HIGH in Vitamin K °(1 serving = ½ cup)  °Collards (cooked, or boiled & drained) °Kale (cooked, or boiled & drained) °Mustard greens (cooked, or boiled & drained) °Parsley *serving size only = ¼ cup °Spinach (cooked, or boiled & drained) °Swiss chard (cooked, or boiled & drained) °Turnip greens (cooked, or boiled & drained)  °Eat a consistent number of servings per week of foods MEDIUM-HIGH in Vitamin K °(1 serving = 1 cup)  °Asparagus (cooked, or boiled & drained) °Broccoli (cooked, boiled & drained, or raw & chopped) °Brussel sprouts (cooked, or boiled & drained) *serving size only = ½ cup °Lettuce, raw (green leaf, endive, romaine) °Spinach, raw °Turnip greens, raw & chopped  ° °These websites have more information on Coumadin (warfarin):  www.coumadin.com; °www.ahrq.gov/consumer/coumadin.htm; ° ° ° °

## 2016-02-10 NOTE — Progress Notes (Signed)
PARENTERAL NUTRITION CONSULT NOTE -  FOLLOW UP  Pharmacy Consult:  TPN Indication:  Intractable nausea and vomiting  Patient Measurements: Height: 5\' 1"  (154.9 cm) Weight: 134 lb 0.6 oz (60.8 kg) IBW/kg (Calculated) : 47.8   Insulin Requirements in the past 24 hours:  2 units of sensitive SSI  Assessment: 43 YOF with history of recent diagnosis of adenocarcinoma of the stomach and is s/p gastrectomy earlier this month.  Patient presented with intractable nausea and vomiting.  Per family, patient has struggled with poor PO intake for the past week and a half and has now been unable to keep anything down for the past 2 days.  She suffered about a 10 lb weight loss over last 1-2 weeks.  Pharmacy consulted to mange TPN for intractable nausea and vomiting.  GI: GERD / anal fissure / gastric cancer.  EGD showed esophagitis, gastric ulcer at gastric anastomosis.  Prealbumin slightly low at 14.1, BM x4.  Nausea is improving, tolerating clears.  H2RA in TPN Endo: hypothyroid on Synthroid.  No hx DM - CBGs well controlled with minimal SSI use Lytes: all WNL Renal: SCr stable, CrCL 48 ml/min, BUN WNL - good UOP 2.5 ml/kg/hr Pulm: stable on RA Cards: PAF / CAD / AS / tachy-brady syndrome post PPM / HTN / HLD / CHF, new Afib - BP/HR improving on Lopressor, diltiazem gtt Hepatobil: LFTs / tbili / TG WNL. Heme/Onc: hx uterine/kidney/gastric cancer - mild anemia, plts WNL AC: Heparin/Coumadin for Afib, HL therapeutic, INR below goal Neuro: neuropathy / RLS / migraine / anxiety / hx CVA - Robaxin BID, Lyrica ID: LVQ D#4 (6/3 >>) for Enterococal and Citrobacter UTI and possible PNA + Fluc D#4 (6/3 >> ) for oral thrush - afebrile, WBC WNL Best Practices: heparin gtt TPN Access: PICC double lumen placed 02/06/16 TPN start date: 02/06/16  Current Nutrition:  Bariatric clear liquid - minimal intake TPN  Nutritional Goals:  1600-1800 kCal and 75-90 gm protein per day   Plan:  - Continue Clinimix E 5/15  at 70 ml/hr + lipids at 10 ml/hr.  TPN provides 1672 kCal and 84 gm protein per day, meeting 100% of patient's needs. - Daily multivitamin and trace elements in TPN - Pepcid 20mg  daily in TPN (d/c bolus) - D/C SSI/CBG checks - Consider increasing Synthroid to 66mcg PO daily as at home (PO dose = 2x IV dose)  - F/U PO intake and diet advancement to start weaning TPN.   Jancy Sprankle D. Mina Marble, PharmD, BCPS Pager:  [phone] 02/10/2016, 7:56 AM

## 2016-02-10 NOTE — Progress Notes (Addendum)
Patient: [name] / Admit Date: 02/03/2016 / Date of Encounter: 02/10/2016, 1:09 PM   Subjective: Feeling much better. Excited about getting to eat. Historically she notices when she gets up and moves around that is when she is bothered by the AF but in general she feels pretty good today.   Objective: Telemetry: AF rate controlled Physical Exam: Blood pressure 153/65, pulse 64, temperature 98.3 F (36.8 C), temperature source Oral, resp. rate 18, height 5\' 1"  (1.549 m), weight 134 lb 0.6 oz (60.8 kg), SpO2 98 %. General: Well developed, well nourished WF, in no acute distress. Head: Normocephalic, atraumatic, sclera non-icteric, no xanthomas, nares are without discharge. Neck: Negative for carotid bruits. JVP not elevated. Lungs: Clear bilaterally to auscultation without wheezes, rales, or rhonchi. Breathing is unlabored. Heart: Irregulary irregular S1 S2 without rubs/gallops. Soft SEM Abdomen: Soft, non-tender, non-distended with normoactive bowel sounds. No rebound/guarding. Extremities: No clubbing or cyanosis. No edema. Distal pedal pulses are 2+ and equal bilaterally. Neuro: Alert and oriented X 3. Moves all extremities spontaneously. Psych:  Responds to questions appropriately with a normal affect.   Intake/Output Summary (Last 24 hours) at 02/10/16 1309 Last data filed at 02/10/16 0820  Gross per 24 hour  Intake     10 ml  Output   2900 ml  Net  -2890 ml    Inpatient Medications:  . fentaNYL (SUBLIMAZE) injection  12.5 mcg Intravenous Once  . fentaNYL (SUBLIMAZE) injection  25 mcg Intravenous Once  . fluconazole (DIFLUCAN) IV  100 mg Intravenous Q24H  . levofloxacin (LEVAQUIN) IV  750 mg Intravenous Q48H  . levothyroxine  25 mcg Oral QAC breakfast  . metoprolol tartrate  50 mg Oral BID  . pantoprazole  40 mg Oral BID  . pregabalin  75 mg Oral BID  . sodium chloride flush  3 mL Intravenous Q12H  . warfarin  5 mg Oral ONCE-1800  . Warfarin - Pharmacist Dosing  Inpatient   Does not apply q1800   Infusions:  . diltiazem (CARDIZEM) infusion 5 mg/hr (02/10/16 0235)  . Marland KitchenTPN (CLINIMIX-E) Adult 70 mL/hr at 02/09/16 1730   And  . fat emulsion 240 mL (02/09/16 1730)  . Marland KitchenTPN (CLINIMIX-E) Adult     And  . fat emulsion    . heparin 1,200 Units/hr (02/09/16 1408)    Labs:  Recent Labs  02/09/16 0430 02/10/16 0440  NA 139 139  K 4.2 3.9  CL 111 108  CO2 20* 22  GLUCOSE 113* 104*  BUN 13 17  CREATININE 0.82 0.79  CALCIUM 8.5* 9.1  MG 1.9 2.0  PHOS 4.8* 4.6    Recent Labs  02/09/16 0430  AST 17  ALT 13*  ALKPHOS 74  BILITOT 0.6  PROT 5.3*  ALBUMIN 2.7*    Recent Labs  02/09/16 0430 02/10/16 0440  WBC 7.4 8.0  NEUTROABS 4.3  --   HGB 11.0* 11.5*  HCT 34.4* 34.8*  MCV 90.8 89.7  PLT 182 181   No results for input(s): CKTOTAL, CKMB, TROPONINI in the last 72 hours. Invalid input(s): POCBNP No results for input(s): HGBA1C in the last 72 hours.   Radiology/Studies:  Dg Esophagus  02/04/2016  CLINICAL DATA:  Vomiting for 2 days 3 weeks status post partial gastrectomy. Evaluate for food impaction. EXAM: ESOPHOGRAM/BARIUM SWALLOW TECHNIQUE: Single contrast examination was performed using  thin barium. FLUOROSCOPY TIME:  Radiation Exposure Index (as provided by the fluoroscopic device): 238.9 uGy*m2 If the device does not provide the exposure index: Fluoroscopy  Time:  54 seconds Number of Acquired Images:  Fluoro only. COMPARISON:  Postoperative upper GI series 01/23/2016. FINDINGS: Study was performed in the LPO and right lateral decubitus positions. The patient swallowed the barium without difficulty. There is mild presbyesophagus. No evidence of food impaction, mucosal ulceration or stricture. Contrast empties into the gastric pouch and proximal small bowel. No evidence of contrast leak. No laryngeal penetration observed. IMPRESSION: No evidence of food impaction, obstruction or extravasation. Presbyesophagus. Electronically Signed   By:  Richardean Sale M.D.   On: 02/04/2016 08:50   Dg Chest Port 1 View  02/06/2016  CLINICAL DATA:  PICC line placement EXAM: PORTABLE CHEST 1 VIEW COMPARISON:  02/06/2015 FINDINGS: LEFT-sided PICC line placed with tip distal SVC. Stable cardiac silhouette. Small LEFT effusion. Mild central venous pulmonary congestion. No pneumothorax IMPRESSION: 1. PICC line in proper position. 2. LEFT effusion insert venous congestion. Electronically Signed   By: Suzy Bouchard M.D.   On: 02/06/2016 16:57   Dg Chest Port 1 View  02/06/2016  CLINICAL DATA:  Coughing.  Fever and rule out pneumonia. EXAM: PORTABLE CHEST 1 VIEW COMPARISON:  02/03/2016.  PET-CT 12/01/2015 FINDINGS: Stable position of the right dual chamber cardiac pacemaker. New or increased densities at the left lung base. Right lung remains clear. Heart size is grossly stable and within normal limits. Atherosclerotic calcifications at the aortic arch. No acute bone abnormality. IMPRESSION: Increased densities at the left lung base are suggestive for pleural fluid and consolidation. Electronically Signed   By: Markus Daft M.D.   On: 02/06/2016 11:45   Dg Abd Acute W/chest  02/03/2016  CLINICAL DATA:  Vomiting after stomach surgery 01/15/2016. Partial gastrectomy for cancer. EXAM: DG ABDOMEN ACUTE W/ 1V CHEST COMPARISON:  11/12/2015 abdominal CT FINDINGS: Sutures in the left upper quadrant correlating with partial gastrectomy. Study is limited by gasless abdomen. No dilated loops are seen. No evidence of pneumoperitoneum. Trace left effusion. No pneumonia or edema. No cardiomegaly. Dual-chamber pacer from the right with leads in unremarkable position. IMPRESSION: 1. Gasless abdomen, a nonspecific finding that limits sensitivity. No indication of bowel obstruction or perforation. 2. Trace left effusion. Electronically Signed   By: Monte Fantasia M.D.   On: 02/03/2016 18:21   Dg Duanne Limerick W/water Sol Cm  01/23/2016  CLINICAL DATA:  Status post partial gastrectomy.   Evaluate for leak. EXAM: WATER SOLUBLE UPPER GI SERIES TECHNIQUE: Single-column upper GI series was performed using water soluble contrast. CONTRAST:  1 ISOVUE-300 IOPAMIDOL (ISOVUE-300) INJECTION 61% COMPARISON:  01/18/2016 FLUOROSCOPY TIME:  Radiation Exposure Index (as provided by the fluoroscopic device): Not available If the device does not provide the exposure index: Fluoroscopy Time (in minutes and seconds):  1 minutes 18 seconds Number of Acquired Images:  1, the rest saved fluoroscopy FINDINGS: Scout images over the chest epigastrium were obtained. Patient had to take swallows LPO. In this position there was no emptying of the remnant stomach with frequent large volume gastroesophageal reflux. Edema and obstruction at the gastro enterostomy with expected, but when supine and RPO the stomach drained readily. Negative for leak. The duodenal C-loop opacified and is unremarkable. IMPRESSION: 1. Negative for postoperative leak or obstruction. 2. Positional drainage of the remnant stomach. When LPO there was no drainage and large volume, symptomatic gastroesophageal reflux. 3. Distal esophagitis related to #2 or recent NG tube. Electronically Signed   By: Monte Fantasia M.D.   On: 01/23/2016 09:49   Dg Duanne Limerick W/water Sol Cm  01/18/2016  CLINICAL DATA:  82 year old female status post partial gastrectomy. Evaluate for potential postoperative leak or obstruction. EXAM: WATER SOLUBLE UPPER GI SERIES TECHNIQUE: Single-column upper GI series was performed using water soluble contrast. CONTRAST:  60 mL of Gastrografin COMPARISON:  No priors. FLUOROSCOPY TIME:  If the device does not provide the exposure index: Fluoroscopy Time (in minutes and seconds):  1 minutes and 18 seconds Number of Acquired Images:  5 series with multiple images. FINDINGS: Initial image demonstrated a nasogastric a tube in place with tip in the gastric remnant and side-port in the distal esophagus. Midline skin staples are noted. Pacemaker leads  extending over the expected location of the right atrial appendage and right ventricular apex. Gastrografin was infused via the patient's indwelling NG tube, which demonstrated a small gastric remnant. There was gastroesophageal reflux into the esophagus throughout the examination. Contrast readily traversed the pylorus into the proximal small bowel. There was no extravasation of contrast material at any point during the examination. IMPRESSION: 1. Expected postoperative appearance of the stomach following partial gastrectomy, without evidence of postoperative leak or obstruction. These results will be called to the ordering clinician or representative by the Radiologist Assistant, and communication documented in the PACS or zVision Dashboard. Electronically Signed   By: [name] M.D.   On: 01/18/2016 15:59     Assessment and Plan  59F with PAF, CAD (small branch vessel disease in 2008), tachy-brady syndrome s/p PPM in 2011, HTN, HLD, CVA with left eye blindness, CKD stage III (renal cell cancer s/p nephrectomy), PDA repair, recently dx adenocarcinoma of her stomach (s/p subtotal gastrectomy) admitted with n/v, esophagitis by EGD without bleeding, and UTI. She developed rapid atrial fib RVR for which cardiology was consulted. Intolerant of amio in the past. Prior plans were for flecainide per afib clinic notes but she was admitted to the hospital prior to initiation and found to have gastric tumor and had surgery, interrupting her anticoagulation. Last echo 11/2015 with EF 60-65%, mild-mod AS, moderate aortic insufficiency, moderate mitral stenosis, mod TR, EF 60-65%. Nuc 11/2015 normal.  1. Persistent atrial fibrillation, h/o tachy-brady s/p PPM 2011 - will review plans for dilt adjustment with MD - she can likely change to PO today. Since she is feeling better it may be prudent to let her recover from current illness totally before pursuing cardioversion. Per pharmacy recommendation will change  levothyroxine back to home dose.  2. HTN - BP remain elevated, follow with med adjustment.  3. CAD - recent neg stress as above. No anginal sx.  Signed, Melina Copa PA-C Pager: [phone]

## 2016-02-10 NOTE — Progress Notes (Signed)
          Daily Rounding Note  02/10/2016, 8:28 AM  LOS: 4 days   SUBJECTIVE:       Very little po intake but no n/v.  No abd pain.    OBJECTIVE:         Vital signs in last 24 hours:    Temp:  [98.3 F (36.8 C)-98.7 F (37.1 C)] 98.3 F (36.8 C) (06/06 0545) Pulse Rate:  [80-100] 98 (06/06 0545) Resp:  [18] 18 (06/06 0545) BP: (144-150)/(56-74) 144/56 mmHg (06/06 0545) SpO2:  [98 %-100 %] 98 % (06/06 0545) Last BM Date: 02/10/16 Filed Weights   02/03/16 1634 02/03/16 2100  Weight: 59.875 kg (132 lb) 60.8 kg (134 lb 0.6 oz)   General: looks quite well.    Heart: irreg, irreg, rate controlled Chest: clear bil.  No dyspnea or cough Abdomen: soft, NT, ND.  Active BS  Extremities: no CCE Neuro/Psych:  Oriented x 3.  No gross deficits.  In good spirits.    Lab Results:  Recent Labs  02/08/16 0530 02/09/16 0430 02/10/16 0440  WBC 9.7 7.4 8.0  HGB 11.8* 11.0* 11.5*  HCT 36.5 34.4* 34.8*  PLT 180 182 181   BMET  Recent Labs  02/08/16 0530 02/09/16 0430 02/10/16 0440  NA 140 139 139  K 3.3* 4.2 3.9  CL 110 111 108  CO2 20* 20* 22  GLUCOSE 120* 113* 104*  BUN 9 13 17   CREATININE 0.79 0.82 0.79  CALCIUM 8.9 8.5* 9.1     ASSESMENT:   *  Post-gastrectomy dysfunction with nausea and vomiting and severe erosive esophagitis.  Improved even without PPI and Reglan ( these discontinued by various MDs)    Pt much improved.  *  Pyuria, growing 5)K enterococcus and citrobacter.  On Levaquin.    *  Oral cndidiasis.  On Diflucan.   *  Rapid a fib. Rate controlled afib continues. Cards planning TEE/DCCV when INR therapeutic.  On Coumadin and Heparin gtt.    PLAN   *  Add BID Protonix po (has been at this dose for a while PTA)  ? Need for Pepcid (see PharmD T. Dang's note of 6/6) but will let pharmacy and surgery decide if this is still needed in addition to po Protonix.   Since the n/v seems to have resolved, will  not restart Reglan.   *  Defer further dietary advances to Dr Barry Dienes.    *  ? Add PT/OT to help her get her strength back?     Azucena Freed  02/10/2016, 8:28 AM Pager: [phone]      [facility] Attending   I have taken an interval history, reviewed the chart and examined the patient. I agree with the Advanced Practitioner's note, impression and recommendations.     Her post-gastrectomy dysfunction and esophagitis sxs are much better. Stay on PPI Remember metaclopramide if needed - liquid might be a better approach I will sign off I have made her an appt to see me 7/14 3PM  [name], MD, [facility] [phone] (pager) [phone] after 5 PM, weekends and holidays  02/10/2016 4:43 PM

## 2016-02-10 NOTE — Progress Notes (Signed)
Nutrition Follow-up  DOCUMENTATION CODES:   Not applicable  INTERVENTION:    TPN per pharmacy  NUTRITION DIAGNOSIS:   Inadequate oral intake related to altered GI function as evidenced by meal completion < 25%, ongoing  GOAL:   Patient will meet greater than or equal to 90% of their needs, met  MONITOR:   PO intake, Labs, Weight trends, Skin, I & O's, TPN prescription   ASSESSMENT:   [name] is a 82 y.o. female with medical history significant of paroxysmal atrial fibrillation, CABG, aortic root dilation, moderate aortic stenosis, tachycardia-bradycardia syndrome on10/28/2011 (status post pacemaker placement in 2012), GERD, peripheral neuropathy, restless leg syndrome, hypertension, hyperlipidemia, hypothyroidism, CVA with left eye blindness, recently diagnosed with adenocarcinoma of the stomach and is S/P gastrectomy earlier this month by [name] who comes to the emergency department with complaints of nausea and multiple episodes of emesis since yesterday evening.  Patient s/p procedure 6/1: UPPER GI ENDOSCOPY  TPN started 6/2. Advanced to bariatric full liquid diet 6/5. PO intake very poor, however, no nausea or vomiting. Doesn't like Ensure >> declined other supplement options >> allergic to zinc.  Patient is receiving TPN with Clinimix E 5/15 @ 70 ml/hr and lipids @ 10 ml/hr. Provides 1674 kcal and 84 grams protein per day.  Meets 100% minimum estimated energy needs and 100% minimum estimated protein needs.  Diet Order:  TPN (CLINIMIX-E) Adult Diet bariatric full liquid Room service appropriate?: Yes; Fluid consistency:: Thin TPN (CLINIMIX-E) Adult  Skin:  Wound (see comment) (stage I sacrum)  Last BM:  6/6  Height:   Ht Readings from Last 1 Encounters:  02/03/16 '5\' 1"'$  (1.549 m)    Weight:   Wt Readings from Last 1 Encounters:  02/03/16 134 lb 0.6 oz (60.8 kg)    Ideal Body Weight:  47.7 kg  BMI:  Body mass index is 25.34  kg/(m^2).  Estimated Nutritional Needs:   Kcal:  1600-1800  Protein:  75-90 grams  Fluid:  1.6-1.8 L  EDUCATION NEEDS:   No education needs identified at this time   Arthur Holms, RD, LDN Pager #: [phone] After-Hours Pager #: [phone]

## 2016-02-11 DIAGNOSIS — I481 Persistent atrial fibrillation: Secondary | ICD-10-CM

## 2016-02-11 LAB — CULTURE, BLOOD (ROUTINE X 2)
Culture: NO GROWTH
Culture: NO GROWTH

## 2016-02-11 LAB — CBC
HEMATOCRIT: 37.7 % (ref 36.0–46.0)
HEMOGLOBIN: 11.8 g/dL — AB (ref 12.0–15.0)
MCH: 28.8 pg (ref 26.0–34.0)
MCHC: 31.3 g/dL (ref 30.0–36.0)
MCV: 92 fL (ref 78.0–100.0)
Platelets: 195 10*3/uL (ref 150–400)
RBC: 4.1 MIL/uL (ref 3.87–5.11)
RDW: 18.1 % — AB (ref 11.5–15.5)
WBC: 7.6 10*3/uL (ref 4.0–10.5)

## 2016-02-11 LAB — BASIC METABOLIC PANEL
Anion gap: 9 (ref 5–15)
BUN: 19 mg/dL (ref 6–20)
CALCIUM: 9.3 mg/dL (ref 8.9–10.3)
CHLORIDE: 108 mmol/L (ref 101–111)
CO2: 22 mmol/L (ref 22–32)
CREATININE: 0.77 mg/dL (ref 0.44–1.00)
GFR calc Af Amer: >60 mL/min (ref >60)
GFR calc non Af Amer: >60 mL/min (ref >60)
GLUCOSE: 129 mg/dL — AB (ref 65–99)
Potassium: 4 mmol/L (ref 3.5–5.1)
Sodium: 139 mmol/L (ref 135–145)

## 2016-02-11 LAB — PROTIME-INR
INR: 1.38 (ref 0.00–1.49)
Prothrombin Time: 17 seconds — ABNORMAL HIGH (ref 11.6–15.2)

## 2016-02-11 LAB — MAGNESIUM: Magnesium: 1.9 mg/dL (ref 1.7–2.4)

## 2016-02-11 LAB — HEPARIN LEVEL (UNFRACTIONATED)
Heparin Unfractionated: 0.2 IU/mL — ABNORMAL LOW (ref 0.30–0.70)
Heparin Unfractionated: 0.63 IU/mL (ref 0.30–0.70)

## 2016-02-11 MED ORDER — FAT EMULSION 20 % IV EMUL
240.0000 mL | INTRAVENOUS | Status: AC
  Administered 2016-02-11: 240 mL via INTRAVENOUS
  Filled 2016-02-11: qty 250

## 2016-02-11 MED ORDER — HEPARIN BOLUS VIA INFUSION
2000.0000 [IU] | Freq: Once | INTRAVENOUS | Status: AC
  Administered 2016-02-11: 2000 [IU] via INTRAVENOUS
  Filled 2016-02-11: qty 2000

## 2016-02-11 MED ORDER — WARFARIN SODIUM 5 MG PO TABS
5.0000 mg | ORAL_TABLET | Freq: Once | ORAL | Status: AC
  Administered 2016-02-11: 5 mg via ORAL
  Filled 2016-02-11: qty 1

## 2016-02-11 MED ORDER — AMOXICILLIN-POT CLAVULANATE 500-125 MG PO TABS
1.0000 | ORAL_TABLET | Freq: Two times a day (BID) | ORAL | Status: DC
  Administered 2016-02-11 – 2016-02-13 (×4): 500 mg via ORAL
  Filled 2016-02-11 (×5): qty 1

## 2016-02-11 MED ORDER — TRACE MINERALS CR-CU-MN-SE-ZN 10-1000-500-60 MCG/ML IV SOLN
INTRAVENOUS | Status: AC
  Administered 2016-02-11: 17:00:00 via INTRAVENOUS
  Filled 2016-02-11: qty 1680

## 2016-02-11 MED ORDER — DILTIAZEM HCL 30 MG PO TABS
30.0000 mg | ORAL_TABLET | Freq: Four times a day (QID) | ORAL | Status: AC
  Administered 2016-02-11: 30 mg via ORAL
  Filled 2016-02-11: qty 1

## 2016-02-11 MED ORDER — DILTIAZEM HCL ER COATED BEADS 180 MG PO CP24
180.0000 mg | ORAL_CAPSULE | Freq: Every day | ORAL | Status: DC
  Administered 2016-02-12 – 2016-02-13 (×2): 180 mg via ORAL
  Filled 2016-02-11 (×2): qty 1

## 2016-02-11 NOTE — Progress Notes (Signed)
ANTICOAGULATION CONSULT NOTE - Follow Up Consult  Pharmacy Consult for heparin Indication: atrial fibrillation   Labs:  Recent Labs  02/09/16 0430 02/10/16 0440 02/11/16 0508  HGB 11.0* 11.5* 11.8*  HCT 34.4* 34.8* 37.7  PLT 182 181 195  LABPROT 16.6* 16.6* 17.0*  INR 1.33 1.33 1.38  HEPARINUNFRC 0.52 0.46 0.20*  CREATININE 0.82 0.79 0.77    Assessment: 82yo female now subtherapeutic on heparin after two levels at goal, no gtt issues overnight per RN.  Goal of Therapy:  Heparin level 0.3-0.7 units/ml   Plan:  Will bolus with heparin 2000 units and increase gtt by 2-3 units/kg/hr to 1400 units/hr and check level in [place], PharmD, BCPS  02/11/2016,6:49 AM

## 2016-02-11 NOTE — Progress Notes (Signed)
Patient Name: [name] Date of Encounter: 02/11/2016  Principal Problem:   Intractable nausea and vomiting Active Problems:   Hypothyroidism   Essential hypertension   Vitamin B12 deficiency   Adenocarcinoma of stomach (HCC)   Persistent atrial fibrillation (HCC)   Pressure ulcer   Hypernatremia   Hypokalemia   Dehydration   Esophagitis   Gastroesophageal reflux disease with esophagitis   Uncontrollable vomiting   Urinary tract infectious disease   Post gastrectomy syndrome   Erosive esophagitis   Primary Cardiologist: Dr. [name] Patient Profile: [phone] with PAF, CAD (small branch vessel disease in 2008), tachy-brady syndrome s/p PPM in 2011, HTN, HLD, CVA with left eye blindness, CKD stage III (renal cell cancer s/p nephrectomy), PDA repair, recently dx adenocarcinoma of her stomach (s/p subtotal gastrectomy) admitted with n/v, esophagitis by EGD without bleeding, and UTI. She developed rapid atrial fib RVR for which cardiology was consulted. Intolerant of amio in the past. Prior plans were for flecainide per afib clinic notes but she was admitted to the hospital prior to initiation and found to have gastric tumor and had surgery, interrupting her anticoagulation. Last echo 11/2015 with EF 60-65%, mild-mod AS, moderate aortic insufficiency, moderate mitral stenosis, mod TR, EF 60-65%. Nuc 11/2015 normal.  SUBJECTIVE: Feels well, sitting up in chair today. Denies palpitations and SOB.    OBJECTIVE Filed Vitals:   02/10/16 2032 02/10/16 2331 02/11/16 0010 02/11/16 0510  BP: 186/66 177/77 129/59 156/79  Pulse: 76 71 69 89  Temp: 98.2 F (36.8 C)   98.5 F (36.9 C)  TempSrc: Oral   Oral  Resp: 18   18  Height:      Weight:      SpO2: 100%   98%    Intake/Output Summary (Last 24 hours) at 02/11/16 0832 Last data filed at 02/11/16 0155  Gross per 24 hour  Intake    240 ml  Output   2100 ml  Net  -1860 ml   Filed Weights   02/03/16 1634 02/03/16 2100  Weight:  132 lb (59.875 kg) 134 lb 0.6 oz (60.8 kg)    PHYSICAL EXAM General: Well developed, well nourished, female in no acute distress. Head: Normocephalic, atraumatic.  Neck: Supple without bruits, No JVD. Lungs:  Resp regular and unlabored, CTA. Heart: RRR, S1, S2, no S3, S4, or murmur; no rub. Abdomen: Soft, non-tender, non-distended, BS + x 4.  Extremities: No clubbing, cyanosis, No edema.  Neuro: Alert and oriented X 3. Moves all extremities spontaneously. Psych: Normal affect.  LABS: CBC: Recent Labs  02/09/16 0430 02/10/16 0440 02/11/16 0508  WBC 7.4 8.0 7.6  NEUTROABS 4.3  --   --   HGB 11.0* 11.5* 11.8*  HCT 34.4* 34.8* 37.7  MCV 90.8 89.7 92.0  PLT 182 181 195   INR: Recent Labs  02/11/16 0508  INR [id number]   Basic Metabolic Panel: Recent Labs  02/09/16 0430 02/10/16 0440 02/11/16 0508  NA 139 139 139  K 4.2 3.9 4.0  CL 111 108 108  CO2 20* 22 22  GLUCOSE 113* 104* 129*  BUN 13 17 19   CREATININE 0.82 0.79 0.77  CALCIUM 8.5* 9.1 9.3  MG 1.9 2.0 1.9  PHOS 4.8* 4.6  --    Liver Function Tests: Recent Labs  02/09/16 0430  AST 17  ALT 13*  ALKPHOS 74  BILITOT 0.6  PROT 5.3*  ALBUMIN 2.7*   Fasting Lipid Panel: Recent Labs  02/09/16 0430  TRIG 92  Current facility-administered medications:  .  acetaminophen (TYLENOL) suppository 650 mg, 650 mg, Rectal, Q4H PRN, Rhetta Mura Schorr, NP, 650 mg at 02/08/16 2009 .  ALPRAZolam (XANAX) tablet 0.5 mg, 0.5 mg, Oral, TID PRN, Nishant Dhungel, MD .  diltiazem (CARDIZEM) tablet 30 mg, 30 mg, Oral, Q6H, Dayna N Dunn, PA-C, 30 mg at 02/11/16 0553 .  diphenhydrAMINE (BENADRYL) injection 25 mg, 25 mg, Intravenous, Q6H PRN, [name], MD, 25 mg at 02/07/16 2248 .  TPN (CLINIMIX-E) Adult, , Intravenous, Continuous TPN, Last Rate: 70 mL/hr at 02/10/16 1710 **AND** fat emulsion 20 % infusion 240 mL, 240 mL, Intravenous, Continuous TPN, Thuy D Dang, RPH, Last Rate: 10 mL/hr at 02/10/16 1709, 240 mL at  02/10/16 1709 .  TPN (CLINIMIX-E) Adult, , Intravenous, Continuous TPN **AND** fat emulsion 20 % infusion 240 mL, 240 mL, Intravenous, Continuous TPN, Tyrone Apple, RPH .  fentaNYL (SUBLIMAZE) injection 12.5 mcg, 12.5 mcg, Intravenous, Once, Nishant Dhungel, MD, 12.5 mcg at 02/08/16 1430 .  fentaNYL (SUBLIMAZE) injection 25 mcg, 25 mcg, Intravenous, Once, [name], MD, 25 mcg at 02/04/16 0730 .  fluconazole (DIFLUCAN) IVPB 100 mg, 100 mg, Intravenous, Q24H, Nishant Dhungel, MD, 100 mg at 02/10/16 0815 .  heparin ADULT infusion 100 units/mL (25000 units/243mL sodium chloride 0.45%), 1,400 Units/hr, Intravenous, Continuous, Veronda Rolly Salter, RPH, Last Rate: 14 mL/hr at 02/11/16 0653, 1,400 Units/hr at 02/11/16 0653 .  heparin bolus via infusion 2,000 Units, 2,000 Units, Intravenous, Once, [employer], RPH .  hydrALAZINE (APRESOLINE) injection 20 mg, 20 mg, Intravenous, Q4H PRN, Nishant Dhungel, MD, 20 mg at 02/10/16 2337 .  levofloxacin (LEVAQUIN) IVPB 750 mg, 750 mg, Intravenous, Q48H, Nishant Dhungel, MD, 750 mg at 02/09/16 1410 .  levothyroxine (SYNTHROID, LEVOTHROID) tablet 50 mcg, 50 mcg, Oral, QAC breakfast, Dayna N Dunn, PA-C, 50 mcg at 02/11/16 0553 .  methocarbamol (ROBAXIN) tablet 500 mg, 500 mg, Oral, Q8H PRN, Nishant Dhungel, MD .  metoprolol (LOPRESSOR) tablet 50 mg, 50 mg, Oral, BID, Nishant Dhungel, MD, 50 mg at 02/10/16 2141 .  pantoprazole (PROTONIX) EC tablet 40 mg, 40 mg, Oral, BID, Vena Rua, PA-C, 40 mg at 02/10/16 2141 .  pregabalin (LYRICA) capsule 75 mg, 75 mg, Oral, BID, Nishant Dhungel, MD, 75 mg at 02/10/16 2141 .  prochlorperazine (COMPAZINE) injection 10 mg, 10 mg, Intravenous, Q4H PRN, [name], MD, 10 mg at 02/05/16 0356 .  sodium chloride flush (NS) 0.9 % injection 10-40 mL, 10-40 mL, Intracatheter, PRN, Nishant Dhungel, MD, 10 mL at 02/10/16 1720 .  sodium chloride flush (NS) 0.9 % injection 3 mL, 3 mL, Intravenous, Q12H, [name],  MD, 3 mL at 02/06/16 1000 .  warfarin (COUMADIN) tablet 5 mg, 5 mg, Oral, ONCE-1800, Lyndee Leo, RPH .  Warfarin - Pharmacist Dosing Inpatient, , Does not apply, q1800, Kris Mouton, [facility] . Marland KitchenTPN (CLINIMIX-E) Adult 70 mL/hr at 02/10/16 1710   And  . fat emulsion 240 mL (02/10/16 1709)  . Marland KitchenTPN (CLINIMIX-E) Adult     And  . fat emulsion    . heparin 1,400 Units/hr (02/11/16 [id number])   TELE: AFib rate controlled       Current Medications:  . diltiazem  30 mg Oral Q6H  . fentaNYL (SUBLIMAZE) injection  12.5 mcg Intravenous Once  . fentaNYL (SUBLIMAZE) injection  25 mcg Intravenous Once  . fluconazole (DIFLUCAN) IV  100 mg Intravenous Q24H  . heparin  2,000 Units Intravenous Once  . levofloxacin (  LEVAQUIN) IV  750 mg Intravenous Q48H  . levothyroxine  50 mcg Oral QAC breakfast  . metoprolol tartrate  50 mg Oral BID  . pantoprazole  40 mg Oral BID  . pregabalin  75 mg Oral BID  . sodium chloride flush  3 mL Intravenous Q12H  . warfarin  5 mg Oral ONCE-1800  . Warfarin - Pharmacist Dosing Inpatient   Does not apply q1800   . Marland KitchenTPN (CLINIMIX-E) Adult 70 mL/hr at 02/10/16 1710   And  . fat emulsion 240 mL (02/10/16 1709)  . Marland KitchenTPN (CLINIMIX-E) Adult     And  . fat emulsion    . heparin 1,400 Units/hr (02/11/16 [id number])    ASSESSMENT AND PLAN: Principal Problem:   Intractable nausea and vomiting Active Problems:   Hypothyroidism   Essential hypertension   Vitamin B12 deficiency   Adenocarcinoma of stomach (HCC)   Persistent atrial fibrillation (HCC)   Pressure ulcer   Hypernatremia   Hypokalemia   Dehydration   Esophagitis   Gastroesophageal reflux disease with esophagitis   Uncontrollable vomiting   Urinary tract infectious disease   Post gastrectomy syndrome   Erosive esophagitis  1. Persistent atrial fibrillation, h/o tachy-brady s/p PPM 2011:  Started oral diltiazem yesterday. Can consolidate with long acting formulation today. TSH was 0.851, she was switched to her home  dose of Synthroid yesterday per pharmacy. Can consider starting flecainide after 4 weeks of anticoagulation.   2. HTN - BP remain elevated, follow with med adjustment.  3. CAD - recent neg stress as above. No anginal sx.   Signed, Arbutus Leas , NP 8:32 AM 02/11/2016 Pager [phone]

## 2016-02-11 NOTE — Progress Notes (Signed)
PARENTERAL NUTRITION CONSULT NOTE -  FOLLOW UP  Pharmacy Consult:  TPN Indication:  Intractable nausea and vomiting  Patient Measurements: Height: 5\' 1"  (154.9 cm) Weight: 134 lb 0.6 oz (60.8 kg) IBW/kg (Calculated) : 47.8   Insulin Requirements in the past 24 hours:  SSI d/c'ed 02/10/16  Assessment: 44 YOF with history of recent diagnosis of adenocarcinoma of the stomach and is s/p gastrectomy earlier this month.  Patient presented with intractable nausea and vomiting.  Per family, patient has struggled with poor PO intake for the past week and a half and has now been unable to keep anything down for the past 2 days.  She suffered about a 10 lb weight loss over last 1-2 weeks.  Pharmacy consulted to mange TPN for intractable nausea and vomiting.  GI: GERD / anal fissure / gastric cancer.  EGD showed esophagitis, gastric ulcer at gastric anastomosis.  Prealbumin slightly low at 14.1.  Nausea is improving, tolerating diet but not yet ready to start weaning per Surgery.  BM x7 (chronic from radiation per family) Endo: hypothyroid on Synthroid.  No hx DM - CBGs well controlled with minimal SSI use, so SSI d/c'ed 6/6 Lytes: all WNL Renal: SCr stable, CrCL 48 ml/min, BUN WNL - good UOP 1.7 ml/kg/hr Pulm: stable on RA Cards: PAF / CAD / AS / tachy-brady syndrome post PPM / HTN / HLD / CHF, new Afib - BP trending down, HR controlled on Lopressor, diltiazem gtt > PO Hepatobil: LFTs / tbili / TG WNL. Heme/Onc: hx uterine/kidney/gastric cancer - mild anemia, plts WNL AC: Heparin/Coumadin for Afib, no bleeding Neuro: neuropathy / RLS / migraine / anxiety / hx CVA - Lyrica ID: [id number] D#5 (6/3 >>) for Enterococal and Citrobacter UTI and possible PNA + Fluc D#5 (6/3 >> ) for oral thrush - afebrile, WBC WNL Best Practices: heparin gtt TPN Access: PICC double lumen placed 02/06/16 TPN start date: 02/06/16  Current Nutrition:  Bariatric full liquid - eating 100% of meals TPN  Nutritional Goals:  1600-1800  kCal and 75-90 gm protein per day   Plan:  - Continue Clinimix E 5/15 at 70 ml/hr + lipids at 10 ml/hr.  TPN provides 1672 kCal and 84 gm protein per day, meeting 100% of patient's needs. - Daily multivitamin and trace elements in TPN - D/C Pepcid from TPN since MD started PO Protonix - F/U PO intake and diet advancement to start weaning TPN   Sabreena Vogan D. Mina Marble, PharmD, BCPS Pager:  [phone] 02/11/2016, 7:35 AM

## 2016-02-11 NOTE — Progress Notes (Signed)
PROGRESS NOTE                                                                                                                                                                                                             Patient Demographics:    [name], is a 82 y.o. female, DOB - [birth year], [id number]  Admit date - 02/03/2016   Admitting Physician [name], MD  Outpatient Primary MD for the patient is [name], MD  LOS -   Outpatient Specialists: [name] Dr [name] ( cardiology)  Chief Complaint  Patient presents with  . Emesis       Brief Narrative   82 year old female with paroxysmal A. fib, CAD with history of CABG, moderate BS, tachycardia-bradycardia syndrome status post pacemaker, GERD, peripheral neuropathy, hypertension, hypothyroidism, CVA with left eye blindness, recently diagnosed adenocarcinoma of the stomach status post gastrectomy done earlier this month by [name] presented to the ED with nausea with multiple episodes of vomiting for 1 day.. Patient reported dysphagia with frequent loose stools.  -Barium esophagogram-- presbyesophagus with contrast passing into the proximal small bowel.  - EGD showing mid-distal third esophagitis without bleeding. Patent surgical anastomosis. Showed mid-distal reflux esophagitis, ,gastric ulcers at the gastric anastomosis with no bleeding stigmata. (Largest measuring 5 mm) Hospital course prolonged due to nausea and vomiting causing inability to take her medications  requiring TNA and rapid A. fib   Subjective:   Pt has no new complaints today.   Assessment  & Plan :    Principal Problem:   Intractable nausea and vomiting EGD findings as above. -BID  PPI, getting H2 blocker with TNA. Reglan stopped as patient developed flushing for face.  Recommend strict antireflux measures with HOB elevation at all times.  -diet advanced to   bariatric diet and tolerating well . -Given severe malnutrition, PICC line placed and started on TNA.  Active Problems:  A. fib with RVR Possibly due to GI symptoms and underlying infection.   Placed on Cardizem drip and being titrated down. Rate controlled on low dose. Cardiology managing.  Started on heparin drip as patient was unable to tolerate by mouth. Coumadin resumed.  SIRS Pt febrile with tachycardia. CXR showed increased left lung base densities suggestive of pleural fluid and consolidation.. Urine culture growing enterococcus and Citrobacter. Antibiotic switched to Levaquin.  UTI  Cultures growing Enterobacter and Citrobacter. Now on Levaquin  Uncontrolled hypertension Blood pressure now better with hydralazine, metoprolol and Cardizem drip.  Adenocarcinoma of stomach ([place]) Status post subtotal gastrectomy recently. Dr Barry Dienes started her on TNA and will follow up as outpt in few weeks. Recommends to stay on liquid only for 2 weeks.   Hypokalemia/hyponatremia   secondary to dehydration Replenished    Hypothyroidism Continue Synthroid.  CAD with history of CABG On metoprolol  Oral thrush Added fluconazole.improving  Restless leg syndrome Resume Lyrica. Also on Robaxin for back spasms.  Code Status : Full code  Family Communication  : Daughter at bedside  Disposition Plan  : Home once clinically improved.  Barriers For Discharge :rapid afib, sub therapeutic INR  Consults  :   [facility]  cardiology  Procedures  :  barium esophagogram EGD  DVT Prophylaxis  :  IV heparin bridging for coumadin  Lab Results  Component Value Date   PLT 195 02/11/2016    Antibiotics  :    Anti-infectives    Start     Dose/Rate Route Frequency Ordered Stop   02/07/16 1445  levofloxacin (LEVAQUIN) IVPB 750 mg     750 mg 100 mL/hr over 90 Minutes Intravenous Every 48 hours 02/07/16 1424     02/07/16 0815  fluconazole (DIFLUCAN) IVPB 100 mg     100 mg 50 mL/hr  over 60 Minutes Intravenous Every 24 hours 02/07/16 0813     02/06/16 1500  azithromycin (ZITHROMAX) 500 mg in dextrose 5 % 250 mL IVPB  Status:  Discontinued     500 mg 250 mL/hr over 60 Minutes Intravenous Every 24 hours 02/06/16 1454 02/07/16 1424   02/04/16 1700  cefTRIAXone (ROCEPHIN) 1 g in dextrose 5 % 50 mL IVPB  Status:  Discontinued     1 g 100 mL/hr over 30 Minutes Intravenous Every 24 hours 02/04/16 1553 02/07/16 1424        Objective:   Filed Vitals:   02/11/16 0510 02/11/16 1014 02/11/16 1233 02/11/16 1358  BP: 156/79 172/62 154/67 128/57  Pulse: 89 75 79 75  Temp: 98.5 F (36.9 C)   98 F (36.7 C)  TempSrc: Oral   Oral  Resp: 18   18  Height:      Weight:      SpO2: 98%   99%    Wt Readings from Last 3 Encounters:  02/03/16 60.8 kg (134 lb 0.6 oz)  01/17/16 73.846 kg (162 lb 12.8 oz)  01/07/16 65.318 kg (144 lb)     Intake/Output Summary (Last 24 hours) at 02/11/16 1445 Last data filed at 02/11/16 1401  Gross per 24 hour  Intake    340 ml  Output   2650 ml  Net  -2310 ml     Physical Exam  Gen: Pt in nad, alert and awake HEENT: no pallor, dry mucosa, supple neck Chest: clear b/l, no added sounds CVS: S1 and S2 irregular regular, no murmurs rubs or gallop GI: soft, NT, ND, BS+ Musculoskeletal: warm, no edema CNS: Alert and oriented     Data Review:    CBC  Recent Labs Lab 02/05/16 0245 02/06/16 0602 02/07/16 0520 02/08/16 0530 02/09/16 0430 02/10/16 0440 02/11/16 0508  WBC 8.4 6.7 8.5 9.7 7.4 8.0 7.6  HGB 12.1 11.1* 11.3* 11.8* 11.0* 11.5* 11.8*  HCT 39.2 36.6 35.8* 36.5 34.4* 34.8* 37.7  PLT 212 186 184 180 182 181 195  MCV 92.7 94.3 90.6 89.9  90.8 89.7 92.0  MCH 28.6 28.6 28.6 29.1 29.0 29.6 28.8  MCHC 30.9 30.3 31.6 32.3 32.0 33.0 31.3  RDW 17.8* 17.6* 16.8* 17.3* 17.8* 18.2* 18.1*  LYMPHSABS 1.9 1.5  --   --  1.8  --   --   MONOABS 0.5 0.6  --   --  0.7  --   --   EOSABS 0.4 0.4  --   --  0.7  --   --   BASOSABS 0.1 0.0   --   --  0.0  --   --     Chemistries   Recent Labs Lab 02/05/16 0245 02/06/16 0602 02/07/16 0520 02/08/16 0530 02/09/16 0430 02/10/16 0440 02/11/16 0508  NA 146* 142 138 140 139 139 139  K 3.5 3.5 2.9* 3.3* 4.2 3.9 4.0  CL 118* 113* 107 110 111 108 108  CO2 18* 20* 21* 20* 20* 22 22  GLUCOSE 103* 88 118* 120* 113* 104* 129*  BUN 15 10 8 9 13 17 19   CREATININE 0.98 0.98 0.84 0.79 0.82 0.79 0.77  CALCIUM 9.1 8.9 8.7* 8.9 8.5* 9.1 9.3  MG  --  1.6* 1.8  --  1.9 2.0 1.9  AST 24 23  --   --  17  --   --   ALT 13* 13*  --   --  13*  --   --   ALKPHOS 80 74  --   --  74  --   --   BILITOT 0.6 0.9  --   --  0.6  --   --    ------------------------------------------------------------------------------------------------------------------  Recent Labs  02/09/16 0430  TRIG 92    Lab Results  Component Value Date   HGBA1C 5.7* 10/09/2015   ------------------------------------------------------------------------------------------------------------------ No results for input(s): TSH, T4TOTAL, T3FREE, THYROIDAB in the last 72 hours.  Invalid input(s): FREET3 ------------------------------------------------------------------------------------------------------------------ No results for input(s): VITAMINB12, FOLATE, FERRITIN, TIBC, IRON, RETICCTPCT in the last 72 hours.  Coagulation profile  Recent Labs Lab 02/07/16 0520 02/08/16 0530 02/09/16 0430 02/10/16 0440 02/11/16 0508  INR 1.34 1.45 1.33 1.33 1.38    No results for input(s): DDIMER in the last 72 hours.  Cardiac Enzymes No results for input(s): CKMB, TROPONINI, MYOGLOBIN in the last 168 hours.  Invalid input(s): CK ------------------------------------------------------------------------------------------------------------------ No results found for: BNP  Inpatient Medications  Scheduled Meds: . diltiazem  30 mg Oral Q6H  . fentaNYL (SUBLIMAZE) injection  12.5 mcg Intravenous Once  . fentaNYL  (SUBLIMAZE) injection  25 mcg Intravenous Once  . fluconazole (DIFLUCAN) IV  100 mg Intravenous Q24H  . levofloxacin (LEVAQUIN) IV  750 mg Intravenous Q48H  . levothyroxine  50 mcg Oral QAC breakfast  . metoprolol tartrate  50 mg Oral BID  . pantoprazole  40 mg Oral BID  . pregabalin  75 mg Oral BID  . sodium chloride flush  3 mL Intravenous Q12H  . warfarin  5 mg Oral ONCE-1800  . Warfarin - Pharmacist Dosing Inpatient   Does not apply q1800   Continuous Infusions: . Marland KitchenTPN (CLINIMIX-E) Adult 70 mL/hr at 02/10/16 1710   And  . fat emulsion 240 mL (02/10/16 1709)  . Marland KitchenTPN (CLINIMIX-E) Adult     And  . fat emulsion    . heparin 1,400 Units/hr (02/11/16 1316)   PRN Meds:.acetaminophen, ALPRAZolam, diphenhydrAMINE, hydrALAZINE, methocarbamol, prochlorperazine, sodium chloride flush  Micro Results Recent Results (from the past 240 hour(s))  Urine culture     Status: Abnormal   Collection Time:  02/04/16  6:35 PM  Result Value Ref Range Status   Specimen Description URINE, CLEAN CATCH  Final   Special Requests Normal  Final   Culture (A)  Final    50,000 COLONIES/mL ENTEROCOCCUS SPECIES 50,000 COLONIES/mL CITROBACTER FREUNDII    Report Status 02/07/2016 FINAL  Final   Organism ID, Bacteria ENTEROCOCCUS SPECIES (A)  Final   Organism ID, Bacteria CITROBACTER FREUNDII (A)  Final      Susceptibility   Citrobacter freundii - MIC*    CEFAZOLIN >=64 RESISTANT Resistant     CEFTRIAXONE <=1 SENSITIVE Sensitive     CIPROFLOXACIN <=0.25 SENSITIVE Sensitive     GENTAMICIN <=1 SENSITIVE Sensitive     IMIPENEM 0.5 SENSITIVE Sensitive     NITROFURANTOIN <=16 SENSITIVE Sensitive     TRIMETH/SULFA <=20 SENSITIVE Sensitive     PIP/TAZO <=4 SENSITIVE Sensitive     * 50,000 COLONIES/mL CITROBACTER FREUNDII   Enterococcus species - MIC*    AMPICILLIN <=2 SENSITIVE Sensitive     LEVOFLOXACIN 1 SENSITIVE Sensitive     NITROFURANTOIN <=16 SENSITIVE Sensitive     VANCOMYCIN 1 SENSITIVE Sensitive      * 50,000 COLONIES/mL ENTEROCOCCUS SPECIES  Culture, blood (routine x 2)     Status: None (Preliminary result)   Collection Time: 02/06/16  8:15 AM  Result Value Ref Range Status   Specimen Description BLOOD RIGHT ANTECUBITAL  Final   Special Requests BOTTLES DRAWN AEROBIC ONLY 8CC  Final   Culture NO GROWTH 4 DAYS  Final   Report Status PENDING  Incomplete  Culture, blood (routine x 2)     Status: None (Preliminary result)   Collection Time: 02/06/16  8:25 AM  Result Value Ref Range Status   Specimen Description BLOOD LEFT HAND  Final   Special Requests IN PEDIATRIC BOTTLE 3CC  Final   Culture NO GROWTH 4 DAYS  Final   Report Status PENDING  Incomplete  C difficile quick scan w PCR reflex     Status: None   Collection Time: 02/07/16  6:07 PM  Result Value Ref Range Status   C Diff antigen NEGATIVE NEGATIVE Final   C Diff toxin NEGATIVE NEGATIVE Final   C Diff interpretation Negative for toxigenic C. difficile  Final    Radiology Reports Dg Esophagus  02/04/2016  CLINICAL DATA:  Vomiting for 2 days 3 weeks status post partial gastrectomy. Evaluate for food impaction. EXAM: ESOPHOGRAM/BARIUM SWALLOW TECHNIQUE: Single contrast examination was performed using  thin barium. FLUOROSCOPY TIME:  Radiation Exposure Index (as provided by the fluoroscopic device): 238.9 uGy*m2 If the device does not provide the exposure index: Fluoroscopy Time:  54 seconds Number of Acquired Images:  Fluoro only. COMPARISON:  Postoperative upper GI series 01/23/2016. FINDINGS: Study was performed in the LPO and right lateral decubitus positions. The patient swallowed the barium without difficulty. There is mild presbyesophagus. No evidence of food impaction, mucosal ulceration or stricture. Contrast empties into the gastric pouch and proximal small bowel. No evidence of contrast leak. No laryngeal penetration observed. IMPRESSION: No evidence of food impaction, obstruction or extravasation. Presbyesophagus.  Electronically Signed   By: Richardean Sale M.D.   On: 02/04/2016 08:50   Dg Chest Port 1 View  02/06/2016  CLINICAL DATA:  PICC line placement EXAM: PORTABLE CHEST 1 VIEW COMPARISON:  02/06/2015 FINDINGS: LEFT-sided PICC line placed with tip distal SVC. Stable cardiac silhouette. Small LEFT effusion. Mild central venous pulmonary congestion. No pneumothorax IMPRESSION: 1. PICC line in proper position. 2. LEFT effusion  insert venous congestion. Electronically Signed   By: Suzy Bouchard M.D.   On: 02/06/2016 16:57   Dg Chest Port 1 View  02/06/2016  CLINICAL DATA:  Coughing.  Fever and rule out pneumonia. EXAM: PORTABLE CHEST 1 VIEW COMPARISON:  02/03/2016.  PET-CT 12/01/2015 FINDINGS: Stable position of the right dual chamber cardiac pacemaker. New or increased densities at the left lung base. Right lung remains clear. Heart size is grossly stable and within normal limits. Atherosclerotic calcifications at the aortic arch. No acute bone abnormality. IMPRESSION: Increased densities at the left lung base are suggestive for pleural fluid and consolidation. Electronically Signed   By: Markus Daft M.D.   On: 02/06/2016 11:45   Dg Abd Acute W/chest  02/03/2016  CLINICAL DATA:  Vomiting after stomach surgery 01/15/2016. Partial gastrectomy for cancer. EXAM: DG ABDOMEN ACUTE W/ 1V CHEST COMPARISON:  11/12/2015 abdominal CT FINDINGS: Sutures in the left upper quadrant correlating with partial gastrectomy. Study is limited by gasless abdomen. No dilated loops are seen. No evidence of pneumoperitoneum. Trace left effusion. No pneumonia or edema. No cardiomegaly. Dual-chamber pacer from the right with leads in unremarkable position. IMPRESSION: 1. Gasless abdomen, a nonspecific finding that limits sensitivity. No indication of bowel obstruction or perforation. 2. Trace left effusion. Electronically Signed   By: Monte Fantasia M.D.   On: 02/03/2016 18:21   Dg Duanne Limerick W/water Sol Cm  01/23/2016  CLINICAL DATA:  Status  post partial gastrectomy.  Evaluate for leak. EXAM: WATER SOLUBLE UPPER GI SERIES TECHNIQUE: Single-column upper GI series was performed using water soluble contrast. CONTRAST:  1 ISOVUE-300 IOPAMIDOL (ISOVUE-300) INJECTION 61% COMPARISON:  01/18/2016 FLUOROSCOPY TIME:  Radiation Exposure Index (as provided by the fluoroscopic device): Not available If the device does not provide the exposure index: Fluoroscopy Time (in minutes and seconds):  1 minutes 18 seconds Number of Acquired Images:  1, the rest saved fluoroscopy FINDINGS: Scout images over the chest epigastrium were obtained. Patient had to take swallows LPO. In this position there was no emptying of the remnant stomach with frequent large volume gastroesophageal reflux. Edema and obstruction at the gastro enterostomy with expected, but when supine and RPO the stomach drained readily. Negative for leak. The duodenal C-loop opacified and is unremarkable. IMPRESSION: 1. Negative for postoperative leak or obstruction. 2. Positional drainage of the remnant stomach. When LPO there was no drainage and large volume, symptomatic gastroesophageal reflux. 3. Distal esophagitis related to #2 or recent NG tube. Electronically Signed   By: Monte Fantasia M.D.   On: 01/23/2016 09:49   Dg Duanne Limerick W/water Sol Cm  01/18/2016  CLINICAL DATA:  82 year old female status post partial gastrectomy. Evaluate for potential postoperative leak or obstruction. EXAM: WATER SOLUBLE UPPER GI SERIES TECHNIQUE: Single-column upper GI series was performed using water soluble contrast. CONTRAST:  60 mL of Gastrografin COMPARISON:  No priors. FLUOROSCOPY TIME:  If the device does not provide the exposure index: Fluoroscopy Time (in minutes and seconds):  1 minutes and 18 seconds Number of Acquired Images:  5 series with multiple images. FINDINGS: Initial image demonstrated a nasogastric a tube in place with tip in the gastric remnant and side-port in the distal esophagus. Midline skin staples  are noted. Pacemaker leads extending over the expected location of the right atrial appendage and right ventricular apex. Gastrografin was infused via the patient's indwelling NG tube, which demonstrated a small gastric remnant. There was gastroesophageal reflux into the esophagus throughout the examination. Contrast readily traversed the pylorus into the  proximal small bowel. There was no extravasation of contrast material at any point during the examination. IMPRESSION: 1. Expected postoperative appearance of the stomach following partial gastrectomy, without evidence of postoperative leak or obstruction. These results will be called to the ordering clinician or representative by the Radiologist Assistant, and communication documented in the PACS or zVision Dashboard. Electronically Signed   By: [name] M.D.   On: 01/18/2016 15:59    Time Spent in minutes  25   Velvet Bathe M.D on 02/11/2016 at 2:45 PM  Between 7am to 7pm - Pager - [phone]  After 7pm go to [email or web address] - password [email or web address]  [facility] -  Office  [phone]

## 2016-02-11 NOTE — Progress Notes (Signed)
PROGRESS NOTE    [name]  [id number] DOB: Jul 13, 1934 DOA: 02/03/2016 PCP: [name], MD (Confirm with patient/family/NH records and if not entered, this HAS to be entered at [facility] point of entry. "No PCP" if truly none.)   Brief Narrative: (Start on day 1 of progress note - keep it brief and live) Intractable nausea and vomiting, a fib with rvr, 82 y.o female s/p partial gastrectomy, tolerating well anticoagulation and rate control. Possible cardioversion.   Assessment & Plan:   Principal Problem:   Intractable nausea and vomiting Active Problems:   Hypothyroidism   Essential hypertension   Vitamin B12 deficiency   Adenocarcinoma of stomach (HCC)   Persistent atrial fibrillation (HCC)   Pressure ulcer   Hypernatremia   Hypokalemia   Dehydration   Esophagitis   Gastroesophageal reflux disease with esophagitis   Uncontrollable vomiting   Urinary tract infectious disease   Post gastrectomy syndrome   Erosive esophagitis   1. Cardiovascular.  Patient hemodynamic stable, rate controlled a fib, will continue rate control with calcium channel blockers and anticoagulation with warfarin. Follow cardiology recommendations. Continue metoprolol 50 mg po bid. Will discuss with cardiology, NOC use, (apixaban).  2, Pulmonary, No signs of volume overload, will continue to monitor oxymetry, supplemental 02 per [place] to target 02 sat above 92%  3. Nephrology, Renal function with cr at 0.77 with K at 4.0 and cl at 108. Will follow renal panel in am, avoid hypotension or nephrotoxic meds.  4. Endocrinology. Will continue on levothyroxine, check tsh in am.  5. GU. Urinary tract infection, will avoid fluroquinolone/ warfarin combination, will change to augmentin.   4. Gastrointestinal. Will continue conservative care, no nausea or vomiting, continue tpn per home regimen.  DVT prophylaxis: (Lovenox/Heparin/SCD's/anticoagulated/None (if comfort care) Code Status:  (Full/Partial - specify details) Family Communication: (Specify name, relationship & date discussed. NO "discussed with patient") Disposition Plan: (specify when and where you expect patient to be discharged). Include barriers to DC in this tab.   Consultants:   cardiology  Procedures: (Don't include imaging studies which can be auto populated. Include things that cannot be auto populated i.e. Echo, Carotid and venous dopplers, Foley, Bipap, HD, tubes/drains, wound vac, central lines etc)  Antimicrobials: (specify start and planned stop date. Auto populated tables are space occupying and do not give end dates)  Augmentin   Subjective:  No chest pain, dyspnea or palpitations, no fever or chills, no nausea or vomiting. Tolerating po.   Objective: Filed Vitals:   02/10/16 2331 02/11/16 0010 02/11/16 0510 02/11/16 1014  BP: 177/77 129/59 156/79 172/62  Pulse: 71 69 89 75  Temp:   98.5 F (36.9 C)   TempSrc:   Oral   Resp:   18   Height:      Weight:      SpO2:   98%     Intake/Output Summary (Last 24 hours) at 02/11/16 1209 Last data filed at 02/11/16 0903  Gross per 24 hour  Intake    340 ml  Output   1725 ml  Net  -1385 ml   Filed Weights   02/03/16 1634 02/03/16 2100  Weight: 59.875 kg (132 lb) 60.8 kg (134 lb 0.6 oz)    Examination:  General exam:  Deconditioned E ENT: mild conjunctival pallor, no icterus, oral mucosa moist. Respiratory system: Clear to auscultation. Respiratory effort normal. Cardiovascular system: S1 & S2 heard, irregulary  Irregular, No JVD, positive murmur at the apex, III of VI.  rubs, gallops  or clicks. No pedal edema. Gastrointestinal system: Abdomen is nondistended, soft and nontender. No organomegaly or masses felt. Normal bowel sounds heard. Central nervous system: Alert and oriented. No focal neurological deficits. Extremities: Symmetric 5 x 5 power. Skin: No rashes, lesions or ulcers Psychiatry: Judgement and insight appear normal.  Mood & affect appropriate.     Data Reviewed: I have personally reviewed following labs and imaging studies  CBC:  Recent Labs Lab 02/05/16 0245 02/06/16 0602 02/07/16 0520 02/08/16 0530 02/09/16 0430 02/10/16 0440 02/11/16 0508  WBC 8.4 6.7 8.5 9.7 7.4 8.0 7.6  NEUTROABS 5.5 4.2  --   --  4.3  --   --   HGB 12.1 11.1* 11.3* 11.8* 11.0* 11.5* 11.8*  HCT 39.2 36.6 35.8* 36.5 34.4* 34.8* 37.7  MCV 92.7 94.3 90.6 89.9 90.8 89.7 92.0  PLT 212 186 184 180 182 181 0000000   Basic Metabolic Panel:  Recent Labs Lab 02/06/16 0602 02/07/16 0520 02/08/16 0530 02/09/16 0430 02/10/16 0440 02/11/16 0508  NA 142 138 140 139 139 139  K 3.5 2.9* 3.3* 4.2 3.9 4.0  CL 113* 107 110 111 108 108  CO2 20* 21* 20* 20* 22 22  GLUCOSE 88 118* 120* 113* 104* 129*  BUN 10 8 9 13 17 19   CREATININE 0.98 0.84 0.79 0.82 0.79 0.77  CALCIUM 8.9 8.7* 8.9 8.5* 9.1 9.3  MG 1.6* 1.8  --  1.9 2.0 1.9  PHOS 3.4  --   --  4.8* 4.6  --    GFR: Estimated Creatinine Clearance: 47.7 mL/min (by C-G formula based on Cr of 0.77). Liver Function Tests:  Recent Labs Lab 02/05/16 0245 02/06/16 0602 02/09/16 0430  AST 24 23 17   ALT 13* 13* 13*  ALKPHOS 80 74 74  BILITOT 0.6 0.9 0.6  PROT 5.8* 5.5* 5.3*  ALBUMIN 3.1* 2.8* 2.7*   No results for input(s): LIPASE, AMYLASE in the last 168 hours. No results for input(s): AMMONIA in the last 168 hours. Coagulation Profile:  Recent Labs Lab 02/07/16 0520 02/08/16 0530 02/09/16 0430 02/10/16 0440 02/11/16 0508  INR 1.34 1.45 1.33 1.33 1.38   Cardiac Enzymes: No results for input(s): CKTOTAL, CKMB, CKMBINDEX, TROPONINI in the last 168 hours. BNP (last 3 results) No results for input(s): PROBNP in the last 8760 hours. HbA1C: No results for input(s): HGBA1C in the last 72 hours. CBG:  Recent Labs Lab 02/09/16 0555 02/09/16 1119 02/09/16 1601 02/10/16 02/10/16 0548  GLUCAP 118* 146* 109* 115* 126*   Lipid Profile:  Recent Labs  02/09/16 0430   TRIG 92   Thyroid Function Tests: No results for input(s): TSH, T4TOTAL, FREET4, T3FREE, THYROIDAB in the last 72 hours. Anemia Panel: No results for input(s): VITAMINB12, FOLATE, FERRITIN, TIBC, IRON, RETICCTPCT in the last 72 hours. Sepsis Labs: No results for input(s): PROCALCITON, LATICACIDVEN in the last 168 hours.  Recent Results (from the past 240 hour(s))  Urine culture     Status: Abnormal   Collection Time: 02/04/16  6:35 PM  Result Value Ref Range Status   Specimen Description URINE, CLEAN CATCH  Final   Special Requests Normal  Final   Culture (A)  Final    50,000 COLONIES/mL ENTEROCOCCUS SPECIES 50,000 COLONIES/mL CITROBACTER FREUNDII    Report Status 02/07/2016 FINAL  Final   Organism ID, Bacteria ENTEROCOCCUS SPECIES (A)  Final   Organism ID, Bacteria CITROBACTER FREUNDII (A)  Final      Susceptibility   Citrobacter freundii - MIC*  CEFAZOLIN >=64 RESISTANT Resistant     CEFTRIAXONE <=1 SENSITIVE Sensitive     CIPROFLOXACIN <=0.25 SENSITIVE Sensitive     GENTAMICIN <=1 SENSITIVE Sensitive     IMIPENEM 0.5 SENSITIVE Sensitive     NITROFURANTOIN <=16 SENSITIVE Sensitive     TRIMETH/SULFA <=20 SENSITIVE Sensitive     PIP/TAZO <=4 SENSITIVE Sensitive     * 50,000 COLONIES/mL CITROBACTER FREUNDII   Enterococcus species - MIC*    AMPICILLIN <=2 SENSITIVE Sensitive     LEVOFLOXACIN 1 SENSITIVE Sensitive     NITROFURANTOIN <=16 SENSITIVE Sensitive     VANCOMYCIN 1 SENSITIVE Sensitive     * 50,000 COLONIES/mL ENTEROCOCCUS SPECIES  Culture, blood (routine x 2)     Status: None (Preliminary result)   Collection Time: 02/06/16  8:15 AM  Result Value Ref Range Status   Specimen Description BLOOD RIGHT ANTECUBITAL  Final   Special Requests BOTTLES DRAWN AEROBIC ONLY 8CC  Final   Culture NO GROWTH 4 DAYS  Final   Report Status PENDING  Incomplete  Culture, blood (routine x 2)     Status: None (Preliminary result)   Collection Time: 02/06/16  8:25 AM  Result Value  Ref Range Status   Specimen Description BLOOD LEFT HAND  Final   Special Requests IN PEDIATRIC BOTTLE 3CC  Final   Culture NO GROWTH 4 DAYS  Final   Report Status PENDING  Incomplete  C difficile quick scan w PCR reflex     Status: None   Collection Time: 02/07/16  6:07 PM  Result Value Ref Range Status   C Diff antigen NEGATIVE NEGATIVE Final   C Diff toxin NEGATIVE NEGATIVE Final   C Diff interpretation Negative for toxigenic C. difficile  Final         Radiology Studies: No results found.      Scheduled Meds: . diltiazem  30 mg Oral Q6H  . fentaNYL (SUBLIMAZE) injection  12.5 mcg Intravenous Once  . fentaNYL (SUBLIMAZE) injection  25 mcg Intravenous Once  . fluconazole (DIFLUCAN) IV  100 mg Intravenous Q24H  . levofloxacin (LEVAQUIN) IV  750 mg Intravenous Q48H  . levothyroxine  50 mcg Oral QAC breakfast  . metoprolol tartrate  50 mg Oral BID  . pantoprazole  40 mg Oral BID  . pregabalin  75 mg Oral BID  . sodium chloride flush  3 mL Intravenous Q12H  . warfarin  5 mg Oral ONCE-1800  . Warfarin - Pharmacist Dosing Inpatient   Does not apply q1800   Continuous Infusions: . Marland KitchenTPN (CLINIMIX-E) Adult 70 mL/hr at 02/10/16 1710   And  . fat emulsion 240 mL (02/10/16 1709)  . Marland KitchenTPN (CLINIMIX-E) Adult     And  . fat emulsion    . heparin 1,400 Units/hr (02/11/16 [id number])     LOS: 5 days        Lacosta Hargan Gerome Apley, MD [facility] Pager [phone]  If 7PM-7AM, please contact night-coverage [email or web address] Password [email or web address] 02/11/2016, 12:09 PM

## 2016-02-11 NOTE — Progress Notes (Signed)
Patient ID: [name], female   DOB: 22-Jan-1934, 82 y.o.   MRN: [id number] 6 Days Post-Op   Subjective: Continues to do OK with soft diet.    Objective: Vital signs in last 24 hours: Temp:  [98.1 F (36.7 C)-98.5 F (36.9 C)] 98.5 F (36.9 C) (06/07 0510) Pulse Rate:  [64-89] 89 (06/07 0510) Resp:  [18] 18 (06/07 0510) BP: (129-186)/(59-93) 156/79 mmHg (06/07 0510) SpO2:  [98 %-100 %] 98 % (06/07 0510) Last BM Date: 02/10/16  Intake/Output from previous day: 06/06 0701 - 06/07 0700 In: 480 [P.O.:480] Out: 2250 [Urine:2250] Intake/Output this shift:    General appearance: Alert and pleasant.  No obvious distress. Family in room.  Alert and oriented. Resp: clear to auscultation bilaterally GI: Abdomen soft and nontender.  Incisions well-healed.  Lab Results:   Recent Labs  02/10/16 0440 02/11/16 0508  WBC 8.0 7.6  HGB 11.5* 11.8*  HCT 34.8* 37.7  PLT 181 195   BMET  Recent Labs  02/10/16 0440 02/11/16 0508  NA 139 139  K 3.9 4.0  CL 108 108  CO2 22 22  GLUCOSE 104* 129*  BUN 17 19  CREATININE 0.79 0.77  CALCIUM 9.1 9.3   PT/INR  Recent Labs  02/10/16 0440 02/11/16 0508  LABPROT 16.6* 17.0*  INR 1.33 1.38   ABG No results for input(s): PHART, HCO3 in the last 72 hours.  Invalid input(s): PCO2, PO2  Studies/Results: No results found.  Anti-infectives: Anti-infectives    Start     Dose/Rate Route Frequency Ordered Stop   02/07/16 1445  levofloxacin (LEVAQUIN) IVPB 750 mg     750 mg 100 mL/hr over 90 Minutes Intravenous Every 48 hours 02/07/16 1424     02/07/16 0815  fluconazole (DIFLUCAN) IVPB 100 mg     100 mg 50 mL/hr over 60 Minutes Intravenous Every 24 hours 02/07/16 0813     02/06/16 1500  azithromycin (ZITHROMAX) 500 mg in dextrose 5 % 250 mL IVPB  Status:  Discontinued     500 mg 250 mL/hr over 60 Minutes Intravenous Every 24 hours 02/06/16 1454 02/07/16 1424   02/04/16 1700  cefTRIAXone (ROCEPHIN) 1 g in dextrose 5 % 50 mL  IVPB  Status:  Discontinued     1 g 100 mL/hr over 30 Minutes Intravenous Every 24 hours 02/04/16 1553 02/07/16 1424      Assessment/Plan: s/p Procedure(s): ESOPHAGOGASTRODUODENOSCOPY (EGD)  Adenocarcinoma of stomach, status post subtotal gastrectomy 01/15/2016 Esophagitis -  on twice a day Protonix.  Stay on liquids only for 2 weeks TNA for protein calorie malnutrition.  Diarrhea. Family reports this is somewhat chronic due to radiation therapy.  C. difficile negative.  CAD. History CABG. Pacemaker. Home when stable.    Hypokalemia. resolved  UTI - on levaquin.  Cultures show enterococcus and Citrobacter.  Appears sensitive to quinolones.  Thrush - on diflucan   LOS: 5 days    Yuval Nolet 02/11/2016

## 2016-02-11 NOTE — Progress Notes (Signed)
ANTICOAGULATION CONSULT NOTE - Follow Up Consult  Pharmacy Consult for heparin Indication: atrial fibrillation   Labs:  Recent Labs  02/09/16 0430 02/10/16 0440 02/11/16 0508 02/11/16 1355  HGB 11.0* 11.5* 11.8*  --   HCT 34.4* 34.8* 37.7  --   PLT 182 181 195  --   LABPROT 16.6* 16.6* 17.0*  --   INR 1.33 1.33 1.38  --   HEPARINUNFRC 0.52 0.46 0.20* 0.63  CREATININE 0.82 0.79 0.77  --     Assessment: 82yo female now therapeutic on heparin after rate change this morning. No gtt issues noted per RN. INR 1.38 this am. Goal of Therapy:  Heparin level 0.3-0.7 units/ml   Plan:  Continue heparin at 1400 units/hr Daily HL/CBC/INR Warfarin 5mg  tonight  Erin Hearing PharmD., BCPS Clinical Pharmacist Pager [phone] 02/11/2016 3:09 PM

## 2016-02-12 LAB — COMPREHENSIVE METABOLIC PANEL
ALK PHOS: 78 U/L (ref 38–126)
ALT: 14 U/L (ref 14–54)
AST: 18 U/L (ref 15–41)
Albumin: 3.1 g/dL — ABNORMAL LOW (ref 3.5–5.0)
Anion gap: 7 (ref 5–15)
BUN: 17 mg/dL (ref 6–20)
CALCIUM: 9.4 mg/dL (ref 8.9–10.3)
CHLORIDE: 108 mmol/L (ref 101–111)
CO2: 24 mmol/L (ref 22–32)
CREATININE: 0.74 mg/dL (ref 0.44–1.00)
GFR calc Af Amer: >60 mL/min (ref >60)
Glucose, Bld: 118 mg/dL — ABNORMAL HIGH (ref 65–99)
Potassium: 3.8 mmol/L (ref 3.5–5.1)
SODIUM: 139 mmol/L (ref 135–145)
Total Bilirubin: 0.9 mg/dL (ref 0.3–1.2)
Total Protein: 6 g/dL — ABNORMAL LOW (ref 6.5–8.1)

## 2016-02-12 LAB — PROTIME-INR
INR: 1.75 — ABNORMAL HIGH (ref 0.00–1.49)
Prothrombin Time: 20.4 seconds — ABNORMAL HIGH (ref 11.6–15.2)

## 2016-02-12 LAB — CBC
HCT: 38.4 % (ref 36.0–46.0)
HEMOGLOBIN: 12.1 g/dL (ref 12.0–15.0)
MCH: 29.2 pg (ref 26.0–34.0)
MCHC: 31.5 g/dL (ref 30.0–36.0)
MCV: 92.5 fL (ref 78.0–100.0)
PLATELETS: 183 10*3/uL (ref 150–400)
RBC: 4.15 MIL/uL (ref 3.87–5.11)
RDW: 18 % — ABNORMAL HIGH (ref 11.5–15.5)
WBC: 7 10*3/uL (ref 4.0–10.5)

## 2016-02-12 LAB — TSH: TSH: 1.289 u[IU]/mL (ref 0.350–4.500)

## 2016-02-12 LAB — MAGNESIUM: Magnesium: 1.7 mg/dL (ref 1.7–2.4)

## 2016-02-12 LAB — HEPARIN LEVEL (UNFRACTIONATED)
HEPARIN UNFRACTIONATED: 0.71 [IU]/mL — AB (ref 0.30–0.70)
Heparin Unfractionated: 0.99 IU/mL — ABNORMAL HIGH (ref 0.30–0.70)

## 2016-02-12 LAB — PHOSPHORUS: Phosphorus: 3.9 mg/dL (ref 2.5–4.6)

## 2016-02-12 MED ORDER — FAT EMULSION 20 % IV EMUL
240.0000 mL | INTRAVENOUS | Status: DC
  Administered 2016-02-12: 240 mL via INTRAVENOUS
  Filled 2016-02-12: qty 250

## 2016-02-12 MED ORDER — WARFARIN SODIUM 3 MG PO TABS
3.0000 mg | ORAL_TABLET | Freq: Once | ORAL | Status: AC
Start: 2016-02-12 — End: 2016-02-12
  Administered 2016-02-12: 3 mg via ORAL
  Filled 2016-02-12: qty 1

## 2016-02-12 MED ORDER — TRACE MINERALS CR-CU-MN-SE-ZN 10-1000-500-60 MCG/ML IV SOLN
INTRAVENOUS | Status: DC
  Administered 2016-02-12: 18:00:00 via INTRAVENOUS
  Filled 2016-02-12: qty 1680

## 2016-02-12 MED ORDER — GI COCKTAIL ~~LOC~~: 30.0000 mL | Freq: Three times a day (TID) | ORAL | Status: DC | PRN

## 2016-02-12 NOTE — Progress Notes (Signed)
Utilization review completed.  

## 2016-02-12 NOTE — Progress Notes (Signed)
[place] for Coumadin/Heparin Indication: atrial fibrillation  Allergies  Allergen Reactions  . Amiodarone      Thyroid and liver and kidney problems  . Codeine Nausea And Vomiting  . Gabapentin Itching  . Hydrocodone Other (See Comments)    hallucination  . Morphine And Related Nausea And Vomiting  . Requip [Ropinirole Hcl]     Headache   . Zinc     nausea    Patient Measurements: Height: 5\' 1"  (154.9 cm) Weight: 134 lb 0.6 oz (60.8 kg) IBW/kg (Calculated) : 47.8  Vital Signs: Temp: 97.8 F (36.6 C) (06/08 0507) Temp Source: Oral (06/08 0507) BP: 155/68 mmHg (06/08 0640) Pulse Rate: 97 (06/08 0640)  Labs:  Recent Labs  02/10/16 0440 02/11/16 0508 02/11/16 1355 02/12/16 0505  HGB 11.5* 11.8*  --  12.1  HCT 34.8* 37.7  --  38.4  PLT 181 195  --  183  LABPROT 16.6* 17.0*  --  20.4*  INR 1.33 1.38  --  1.75*  HEPARINUNFRC 0.46 0.20* 0.63 0.99*  CREATININE 0.79 0.77  --  0.74    Estimated Creatinine Clearance: 46.9 mL/min (by C-G formula based on Cr of 0.74).   Assessment: [name] on coumadin pta for afib, admitted 5/30 with N/V. INR 2.84 on admission now s/p INR reversal and EGD and heparin has been restarted. Warfarin restarted 6/4 per Rx.  Heparin level is now supratherapeutic at 0.99. No bleeding noted. INR is starting to trend up but remains low. Of note pt is on fluconazole which may increase the INR. She completed levaquin yesterday.   Last pta dose documented: 3mg  on MWF, 2mg  AOD  Goal of Therapy:  INR 2-3 Monitor platelets by anticoagulation protocol: Yes   Plan:  - Decrease heparin gtt to 1200 units/hr - Check an 8 hour heparin level - Warfarin 3mg  PO x 1 tonight - Daily INR, heparin level and CBC  Salome Arnt, PharmD, BCPS Pager # [phone] 02/12/2016 9:58 AM

## 2016-02-12 NOTE — Progress Notes (Signed)
Patient ID: [name], female   DOB: 10-Jan-1934, 82 y.o.   MRN: [id number] 7 Days Post-Op   Subjective: Had some indigestion last night.    Objective: Vital signs in last 24 hours: Temp:  [97.8 F (36.6 C)] 97.8 F (36.6 C) (06/08 0507) Pulse Rate:  [81-97] 93 (06/08 1033) Resp:  [18] 18 (06/08 0507) BP: (143-173)/(63-92) 143/63 mmHg (06/08 1033) SpO2:  [99 %] 99 % (06/08 0507) Last BM Date: 02/10/16  Intake/Output from previous day: 06/07 0701 - 06/08 0700 In: 340 [P.O.:340] Out: 1075 [Urine:1075] Intake/Output this shift: Total I/O In: 120 [P.O.:120] Out: -   General appearance: Alert and pleasant.  No obvious distress. Family in room.  Alert and oriented. Resp: clear to auscultation bilaterally GI: Abdomen soft and nontender.  Incisions well-healed.  Lab Results:   Recent Labs  02/11/16 0508 02/12/16 0505  WBC 7.6 7.0  HGB 11.8* 12.1  HCT 37.7 38.4  PLT 195 183   BMET  Recent Labs  02/11/16 0508 02/12/16 0505  NA 139 139  K 4.0 3.8  CL 108 108  CO2 22 24  GLUCOSE 129* 118*  BUN 19 17  CREATININE 0.77 0.74  CALCIUM 9.3 9.4   PT/INR  Recent Labs  02/11/16 0508 02/12/16 0505  LABPROT 17.0* 20.4*  INR 1.38 1.75*   ABG No results for input(s): PHART, HCO3 in the last 72 hours.  Invalid input(s): PCO2, PO2  Studies/Results: No results found.  Anti-infectives: Anti-infectives    Start     Dose/Rate Route Frequency Ordered Stop   02/11/16 2200  amoxicillin-clavulanate (AUGMENTIN) 500-125 MG per tablet 500 mg     1 tablet Oral 2 times daily 02/11/16 1601     02/07/16 1445  levofloxacin (LEVAQUIN) IVPB 750 mg  Status:  Discontinued     750 mg 100 mL/hr over 90 Minutes Intravenous Every 48 hours 02/07/16 1424 02/11/16 1601   02/07/16 0815  fluconazole (DIFLUCAN) IVPB 100 mg     100 mg 50 mL/hr over 60 Minutes Intravenous Every 24 hours 02/07/16 0813     02/06/16 1500  azithromycin (ZITHROMAX) 500 mg in dextrose 5 % 250 mL IVPB  Status:   Discontinued     500 mg 250 mL/hr over 60 Minutes Intravenous Every 24 hours 02/06/16 1454 02/07/16 1424   02/04/16 1700  cefTRIAXone (ROCEPHIN) 1 g in dextrose 5 % 50 mL IVPB  Status:  Discontinued     1 g 100 mL/hr over 30 Minutes Intravenous Every 24 hours 02/04/16 1553 02/07/16 1424      Assessment/Plan: s/p Procedure(s): ESOPHAGOGASTRODUODENOSCOPY (EGD)  Adenocarcinoma of stomach, status post subtotal gastrectomy 01/15/2016 Esophagitis -  on twice a day Protonix. Add GI cocktail  Stay on liquids only for 2 weeks TNA for protein calorie malnutrition.  Diarrhea. Family reports this is somewhat chronic due to radiation therapy.  C. difficile negative.  CAD. History CABG. Pacemaker. Home when stable.    Hypokalemia. resolved  UTI - on levaquin.  Cultures show enterococcus and Citrobacter.  Appears sensitive to quinolones.  Thrush - on diflucan   LOS: 6 days    Ezri Fanguy 02/12/2016

## 2016-02-12 NOTE — Progress Notes (Signed)
Physical Therapy Treatment Patient Details Name: [name] MRN: [id number] DOB: 17-Feb-1934 Today's Date: 03-03-16    History of Present Illness Pt readmitted with N/V and found to have esophagitis. Pt also with afib with RVR. Pt with recent gastric cancer now s/p open partial gastrectomy. PMH - CAD, tachy-brady s/p pacer, Afib, Stage 3 CKD, HTN, moderate AoS, hypothyroidism.     PT Comments    Pt making good progress.  Follow Up Recommendations  Home health PT     Equipment Recommendations  None recommended by PT    Recommendations for Other Services       Precautions / Restrictions Precautions Precautions: Fall Restrictions Weight Bearing Restrictions: No    Mobility  Bed Mobility Overal bed mobility: Modified Independent Bed Mobility: Sit to Supine       Sit to supine: Modified independent (Device/Increase time)      Transfers Overall transfer level: Needs assistance Equipment used: Rolling walker (2 wheeled) Transfers: Sit to/from Stand Sit to Stand: Supervision         General transfer comment: for safety  Ambulation/Gait Ambulation/Gait assistance: Supervision Ambulation Distance (Feet): 200 Feet Assistive device: Rolling walker (2 wheeled) Gait Pattern/deviations: Step-through pattern;Decreased stride length;Trunk flexed   Gait velocity interpretation: <1.8 ft/sec, indicative of risk for recurrent falls General Gait Details: Verbal cues for more erect posture.   Stairs            Wheelchair Mobility    Modified Rankin (Stroke Patients Only)       Balance Overall balance assessment: Needs assistance Sitting-balance support: No upper extremity supported;Feet supported Sitting balance-Leahy Scale: Good     Standing balance support: Bilateral upper extremity supported Standing balance-Leahy Scale: Poor Standing balance comment: walker for support                    Cognition Arousal/Alertness: Awake/alert Behavior  During Therapy: WFL for tasks assessed/performed Overall Cognitive Status: Within Functional Limits for tasks assessed                      Exercises      General Comments        Pertinent Vitals/Pain Pain Assessment: No/denies pain    Home Living                      Prior Function            PT Goals (current goals can now be found in the care plan section) Acute Rehab PT Goals Patient Stated Goal: go home Progress towards PT goals: Goals met and updated - see care plan    Frequency  Min 3X/week    PT Plan Current plan remains appropriate    Co-evaluation             End of Session Equipment Utilized During Treatment: Gait belt Activity Tolerance: Patient tolerated treatment well Patient left: with call bell/phone within reach;with family/visitor present;in bed     Time: 9562-1308 PT Time Calculation (min) (ACUTE ONLY): 15 min  Charges:  $Gait Training: 8-22 mins                    G Codes:      Poet Hineman 2016/03/03, 1:19 PM [employer] PT [phone]

## 2016-02-12 NOTE — Progress Notes (Signed)
PROGRESS NOTE    [name]  [id number] DOB: 08-24-1934 DOA: 02/03/2016 PCP: [name], MD (Confirm with patient/family/NH records and if not entered, this HAS to be entered at [facility] point of entry. "No PCP" if truly none.)   Brief Narrative: (Start on day 1 of progress note - keep it brief and live) Intractable nausea and vomiting, a fib with rvr, 82 y.o female s/p partial gastrectomy, tolerating well anticoagulation and rate control.   Assessment & Plan:   Principal Problem:   Intractable nausea and vomiting Active Problems:   Hypothyroidism   Essential hypertension   Vitamin B12 deficiency   Adenocarcinoma of stomach (HCC)   Persistent atrial fibrillation (HCC)   Pressure ulcer   Hypernatremia   Hypokalemia   Dehydration   Esophagitis   Gastroesophageal reflux disease with esophagitis   Uncontrollable vomiting   Urinary tract infectious disease   Post gastrectomy syndrome   Erosive esophagitis   1. Cardiovascular. Continue rate controlled atrial fibrillation, will continue nondehydropiridine  Ca channel blocker, patient clinically euvolemic. Anticoagulation with warfarin, INR close to therapeutic.   2, Pulmonary,  Will continue to monitor oxymetry, supplemental 02 per [place] to target 02 sat above 92%  3. Nephrology, Renal function continue to be stable, k at 3.8 and with Na at 139.   4. Endocrinology. Will continue on levothyroxine, check tsh in am.  5. GU. Urinary tract infection, patient tolerating augmentin well.   4. Gastrointestinal. Will continue conservative care, no nausea or vomiting, continue tpn per home regimen.   DVT prophylaxis: (Lovenox/Heparin/SCD's/anticoagulated/None (if comfort care) Code Status: (Full/Partial - specify details) Family Communication: (Specify name, relationship & date discussed. NO "discussed with patient") Disposition Plan: (specify when and where you expect patient to be discharged). Include barriers to DC in  this tab.   Consultants:   cardiology  Procedures: (Don't include imaging studies which can be auto populated. Include things that cannot be auto populated i.e. Echo, Carotid and venous dopplers, Foley, Bipap, HD, tubes/drains, wound vac, central lines etc)  Antimicrobials: (specify start and planned stop date. Auto populated tables are space occupying and do not give end dates)     Subjective: Patient had episode of chest pain last night, mainly burning in nature. No dyspnea, no abdominal pain.   Objective: Filed Vitals:   02/11/16 2020 02/12/16 0507 02/12/16 0640 02/12/16 1033  BP: 154/65 173/92 155/68 143/63  Pulse: 81 90 97 93  Temp: 97.8 F (36.6 C) 97.8 F (36.6 C)    TempSrc: Oral Oral    Resp: 18 18    Height:      Weight:      SpO2: 99% 99%      Intake/Output Summary (Last 24 hours) at 02/12/16 1645 Last data filed at 02/12/16 1240  Gross per 24 hour  Intake    120 ml  Output      0 ml  Net    120 ml   Filed Weights   02/03/16 1634 02/03/16 2100  Weight: 59.875 kg (132 lb) 60.8 kg (134 lb 0.6 oz)    Examination:  General exam: no in pain or dyspnea E ENT: oral mucosa moist, no conjunctival pallor. Respiratory system: Clear to auscultation. Respiratory effort normal. Cardiovascular system: S1 & S2 heard, irregulary -irregular. No JVD, murmurs, rubs, gallops or clicks. No pedal edema. Gastrointestinal system: Abdomen is nondistended, soft and nontender. No organomegaly or masses felt. Normal bowel sounds heard. Central nervous system: Alert and oriented. No focal neurological deficits. Extremities:  Symmetric 5 x 5 power. Skin: No rashes, lesions or ulcers Psychiatry: Judgement and insight appear normal. Mood & affect appropriate.     Data Reviewed: I have personally reviewed following labs and imaging studies  CBC:  Recent Labs Lab 02/06/16 0602  02/08/16 0530 02/09/16 0430 02/10/16 0440 02/11/16 0508 02/12/16 0505  WBC 6.7  < > 9.7 7.4 8.0  7.6 7.0  NEUTROABS 4.2  --   --  4.3  --   --   --   HGB 11.1*  < > 11.8* 11.0* 11.5* 11.8* 12.1  HCT 36.6  < > 36.5 34.4* 34.8* 37.7 38.4  MCV 94.3  < > 89.9 90.8 89.7 92.0 92.5  PLT 186  < > 180 182 181 195 183  < > = values in this interval not displayed. Basic Metabolic Panel:  Recent Labs Lab 02/06/16 0602 02/07/16 0520 02/08/16 0530 02/09/16 0430 02/10/16 0440 02/11/16 0508 02/12/16 0505  NA 142 138 140 139 139 139 139  K 3.5 2.9* 3.3* 4.2 3.9 4.0 3.8  CL 113* 107 110 111 108 108 108  CO2 20* 21* 20* 20* 22 22 24   GLUCOSE 88 118* 120* 113* 104* 129* 118*  BUN 10 8 9 13 17 19 17   CREATININE 0.98 0.84 0.79 0.82 0.79 0.77 0.74  CALCIUM 8.9 8.7* 8.9 8.5* 9.1 9.3 9.4  MG 1.6* 1.8  --  1.9 2.0 1.9 1.7  PHOS 3.4  --   --  4.8* 4.6  --  3.9   GFR: Estimated Creatinine Clearance: 46.9 mL/min (by C-G formula based on Cr of 0.74). Liver Function Tests:  Recent Labs Lab 02/06/16 0602 02/09/16 0430 02/12/16 0505  AST 23 17 18   ALT 13* 13* 14  ALKPHOS 74 74 78  BILITOT 0.9 0.6 0.9  PROT 5.5* 5.3* 6.0*  ALBUMIN 2.8* 2.7* 3.1*   No results for input(s): LIPASE, AMYLASE in the last 168 hours. No results for input(s): AMMONIA in the last 168 hours. Coagulation Profile:  Recent Labs Lab 02/08/16 0530 02/09/16 0430 02/10/16 0440 02/11/16 0508 02/12/16 0505  INR 1.45 1.33 1.33 1.38 1.75*   Cardiac Enzymes: No results for input(s): CKTOTAL, CKMB, CKMBINDEX, TROPONINI in the last 168 hours. BNP (last 3 results) No results for input(s): PROBNP in the last 8760 hours. HbA1C: No results for input(s): HGBA1C in the last 72 hours. CBG:  Recent Labs Lab 02/09/16 0555 02/09/16 1119 02/09/16 1601 02/10/16 02/10/16 0548  GLUCAP 118* 146* 109* 115* 126*   Lipid Profile: No results for input(s): CHOL, HDL, LDLCALC, TRIG, CHOLHDL, LDLDIRECT in the last 72 hours. Thyroid Function Tests:  Recent Labs  02/12/16 0505  TSH 1.289   Anemia Panel: No results for  input(s): VITAMINB12, FOLATE, FERRITIN, TIBC, IRON, RETICCTPCT in the last 72 hours. Sepsis Labs: No results for input(s): PROCALCITON, LATICACIDVEN in the last 168 hours.  Recent Results (from the past 240 hour(s))  Urine culture     Status: Abnormal   Collection Time: 02/04/16  6:35 PM  Result Value Ref Range Status   Specimen Description URINE, CLEAN CATCH  Final   Special Requests Normal  Final   Culture (A)  Final    50,000 COLONIES/mL ENTEROCOCCUS SPECIES 50,000 COLONIES/mL CITROBACTER FREUNDII    Report Status 02/07/2016 FINAL  Final   Organism ID, Bacteria ENTEROCOCCUS SPECIES (A)  Final   Organism ID, Bacteria CITROBACTER FREUNDII (A)  Final      Susceptibility   Citrobacter freundii - MIC*    CEFAZOLIN >=  64 RESISTANT Resistant     CEFTRIAXONE <=1 SENSITIVE Sensitive     CIPROFLOXACIN <=0.25 SENSITIVE Sensitive     GENTAMICIN <=1 SENSITIVE Sensitive     IMIPENEM 0.5 SENSITIVE Sensitive     NITROFURANTOIN <=16 SENSITIVE Sensitive     TRIMETH/SULFA <=20 SENSITIVE Sensitive     PIP/TAZO <=4 SENSITIVE Sensitive     * 50,000 COLONIES/mL CITROBACTER FREUNDII   Enterococcus species - MIC*    AMPICILLIN <=2 SENSITIVE Sensitive     LEVOFLOXACIN 1 SENSITIVE Sensitive     NITROFURANTOIN <=16 SENSITIVE Sensitive     VANCOMYCIN 1 SENSITIVE Sensitive     * 50,000 COLONIES/mL ENTEROCOCCUS SPECIES  Culture, blood (routine x 2)     Status: None   Collection Time: 02/06/16  8:15 AM  Result Value Ref Range Status   Specimen Description BLOOD RIGHT ANTECUBITAL  Final   Special Requests BOTTLES DRAWN AEROBIC ONLY 8CC  Final   Culture NO GROWTH 5 DAYS  Final   Report Status 02/11/2016 FINAL  Final  Culture, blood (routine x 2)     Status: None   Collection Time: 02/06/16  8:25 AM  Result Value Ref Range Status   Specimen Description BLOOD LEFT HAND  Final   Special Requests IN PEDIATRIC BOTTLE 3CC  Final   Culture NO GROWTH 5 DAYS  Final   Report Status 02/11/2016 FINAL  Final  C  difficile quick scan w PCR reflex     Status: None   Collection Time: 02/07/16  6:07 PM  Result Value Ref Range Status   C Diff antigen NEGATIVE NEGATIVE Final   C Diff toxin NEGATIVE NEGATIVE Final   C Diff interpretation Negative for toxigenic C. difficile  Final         Radiology Studies: No results found.      Scheduled Meds: . amoxicillin-clavulanate  1 tablet Oral BID  . diltiazem  180 mg Oral Daily  . fluconazole (DIFLUCAN) IV  100 mg Intravenous Q24H  . levothyroxine  50 mcg Oral QAC breakfast  . metoprolol tartrate  50 mg Oral BID  . pantoprazole  40 mg Oral BID  . pregabalin  75 mg Oral BID  . sodium chloride flush  3 mL Intravenous Q12H  . warfarin  3 mg Oral ONCE-1800  . Warfarin - Pharmacist Dosing Inpatient   Does not apply q1800   Continuous Infusions: . Marland KitchenTPN (CLINIMIX-E) Adult 70 mL/hr at 02/11/16 1712   And  . fat emulsion 240 mL (02/11/16 1711)  . Marland KitchenTPN (CLINIMIX-E) Adult     And  . fat emulsion    . heparin 1,200 Units/hr (02/12/16 1240)     LOS: 6 days       Mauricio Gerome Apley, MD [facility] Pager [phone]  If 7PM-7AM, please contact night-coverage [email or web address] Password [email or web address] 02/12/2016, 4:45 PM

## 2016-02-12 NOTE — Care Management Important Message (Signed)
Important Message  Patient Details  Name: [name] MRN: [id number] Date of Birth: 02-04-1934   Medicare Important Message Given:  Yes    Divya Munshi Abena 02/12/2016, 10:29 AM

## 2016-02-12 NOTE — Progress Notes (Signed)
[facility] for Heparin Indication: atrial fibrillation  Allergies  Allergen Reactions  . Amiodarone      Thyroid and liver and kidney problems  . Codeine Nausea And Vomiting  . Gabapentin Itching  . Hydrocodone Other (See Comments)    hallucination  . Morphine And Related Nausea And Vomiting  . Requip [Ropinirole Hcl]     Headache   . Zinc     nausea    Patient Measurements: Height: 5\' 1"  (154.9 cm) Weight: 134 lb 0.6 oz (60.8 kg) IBW/kg (Calculated) : 47.8  Vital Signs: BP: 143/63 mmHg (06/08 1033) Pulse Rate: 93 (06/08 1033)  Labs:  Recent Labs  02/10/16 0440 02/11/16 0508 02/11/16 1355 02/12/16 0505 02/12/16 1611  HGB 11.5* 11.8*  --  12.1  --   HCT 34.8* 37.7  --  38.4  --   PLT 181 195  --  183  --   LABPROT 16.6* 17.0*  --  20.4*  --   INR 1.33 1.38  --  1.75*  --   HEPARINUNFRC 0.46 0.20* 0.63 0.99* 0.71*  CREATININE 0.79 0.77  --  0.74  --     Estimated Creatinine Clearance: 46.9 mL/min (by C-G formula based on Cr of 0.74).   Assessment: 81yof on coumadin pta for afib, admitted 5/30 with N/V. INR 2.84 on admission now s/p INR reversal and EGD and heparin has been restarted. Warfarin restarted 6/4 per Rx.  Heparin level this AM supratherapeutic at 0.99. PM HL 0.71 almost back in range.  Last pta dose documented: 3mg  on MWF, 2mg  AOD  Goal of Therapy:  INR 2-3 Monitor platelets by anticoagulation protocol: Yes   Plan:  - Decrease heparin gtt to 1100 units/hr - Daily INR, heparin level and CBC   Temisha Murley S. Alford Highland, PharmD, [facility] Clinical Staff Pharmacist Pager [phone]   02/12/2016 5:47 PM

## 2016-02-12 NOTE — Progress Notes (Signed)
PATIENT ID: [name] is a very pleasant, 68F with paroxysmal atrial fibrillation, hypertension, hyperlipidemia, mild-moderate AS, moderate AR, moderate MS, prior CVA, CKD III, PDA repair and recent subtotal gatastrectomy for adenocarcinoma of the stomach.  SUBJECTIVE:  [name] is feeling well this am.  She had a rough night with racing heart rates.  She continues to complain of sore throat.   PHYSICAL EXAM Filed Vitals:   02/11/16 2020 02/12/16 0507 02/12/16 0640 02/12/16 1033  BP: 154/65 173/92 155/68 143/63  Pulse: 81 90 97 93  Temp: 97.8 F (36.6 C) 97.8 F (36.6 C)    TempSrc: Oral Oral    Resp: 18 18    Height:      Weight:      SpO2: 99% 99%     General:  Well-appearing.  No acute distress.  Neck: No JVP  Lungs:  CTAB.  No crackles, rhonchi or wheezes Heart:  Irregularly irregular.  III/VI systolic and II/IV diastolic murmurs. No rubs or galops.  Abdomen:  Soft, NT, ND.  +BS Extremities:  WWP.  No edema.   LABS: Lab Results  Component Value Date   TROPONINI <0.30 08/03/2014   Results for orders placed or performed during the hospital encounter of 02/03/16 (from the past 24 hour(s))  Heparin level (unfractionated)     Status: None   Collection Time: 02/11/16  1:55 PM  Result Value Ref Range   Heparin Unfractionated 0.63 0.30 - 0.70 IU/mL  Heparin level (unfractionated)     Status: Abnormal   Collection Time: 02/12/16  5:05 AM  Result Value Ref Range   Heparin Unfractionated 0.99 (H) 0.30 - 0.70 IU/mL  TSH     Status: None   Collection Time: 02/12/16  5:05 AM  Result Value Ref Range   TSH 1.289 0.350 - 4.500 uIU/mL  CBC     Status: Abnormal   Collection Time: 02/12/16  5:05 AM  Result Value Ref Range   WBC 7.0 4.0 - 10.5 K/uL   RBC 4.15 3.87 - 5.11 MIL/uL   Hemoglobin 12.1 12.0 - 15.0 g/dL   HCT 38.4 36.0 - 46.0 %   MCV 92.5 78.0 - 100.0 fL   MCH 29.2 26.0 - 34.0 pg   MCHC 31.5 30.0 - 36.0 g/dL   RDW 18.0 (H) 11.5 - 15.5 %   Platelets 183 150 -  400 K/uL  Comprehensive metabolic panel     Status: Abnormal   Collection Time: 02/12/16  5:05 AM  Result Value Ref Range   Sodium 139 135 - 145 mmol/L   Potassium 3.8 3.5 - 5.1 mmol/L   Chloride 108 101 - 111 mmol/L   CO2 24 22 - 32 mmol/L   Glucose, Bld 118 (H) 65 - 99 mg/dL   BUN 17 6 - 20 mg/dL   Creatinine, Ser 0.74 0.44 - 1.00 mg/dL   Calcium 9.4 8.9 - 10.3 mg/dL   Total Protein 6.0 (L) 6.5 - 8.1 g/dL   Albumin 3.1 (L) 3.5 - 5.0 g/dL   AST 18 15 - 41 U/L   ALT 14 14 - 54 U/L   Alkaline Phosphatase 78 38 - 126 U/L   Total Bilirubin 0.9 0.3 - 1.2 mg/dL   GFR calc non Af Amer >60 >60 mL/min   GFR calc Af Amer >60 >60 mL/min   Anion gap 7 5 - 15  Magnesium     Status: None   Collection Time: 02/12/16  5:05 AM  Result Value Ref Range  Magnesium 1.7 1.7 - 2.4 mg/dL  Phosphorus     Status: None   Collection Time: 02/12/16  5:05 AM  Result Value Ref Range   Phosphorus 3.9 2.5 - 4.6 mg/dL  Protime-INR     Status: Abnormal   Collection Time: 02/12/16  5:05 AM  Result Value Ref Range   Prothrombin Time 20.4 (H) 11.6 - 15.2 seconds   INR 1.75 (H) 0.00 - 1.49    Intake/Output Summary (Last 24 hours) at 02/12/16 1308 Last data filed at 02/12/16 1240  Gross per 24 hour  Intake    360 ml  Output      0 ml  Net    360 ml    Telemetry: atrial fibrillation rates 70-130s.   ASSESSMENT AND PLAN:  Principal Problem:   Intractable nausea and vomiting Active Problems:   Hypothyroidism   Essential hypertension   Vitamin B12 deficiency   Adenocarcinoma of stomach (HCC)   Persistent atrial fibrillation (HCC)   Pressure ulcer   Hypernatremia   Hypokalemia   Dehydration   Esophagitis   Gastroesophageal reflux disease with esophagitis   Uncontrollable vomiting   Urinary tract infectious disease   Post gastrectomy syndrome   Erosive esophagitis   # Persistent atrial fibrillation: [name]'s heart rate was well-controlled on diltiazem 30 mg q6h.  However, she had the last  dose yesterday early evening and her heart rate was elevated over night.  She started diltiazem 180 mg po daily today.  Hopefully her heat rate will be better controlled. Continue metoprolol 50mg  bid.  INR is up to 1.7 today.  Goal is 2-3.  This patients CHA2DS2-VASc Score and unadjusted Ischemic Stroke Rate (% per year) is equal to 9.7 % stroke rate/year from a score of 6  Above score calculated as 1 point each if present [CHF, HTN, DM, Vascular=MI/PAD/Aortic Plaque, Age if 65-74, or Female] Above score calculated as 2 points each if present [Age > 75, or Stroke/TIA/TE]   # Hypertension: Diltiazem increased to 180 mg daily.  Continue metoprolol.   # CAD: Non-obstructive disease and negative stress 11/2015.    Timothee Gali C. Oval Linsey, MD, [facility] 02/12/2016 1:08 PM

## 2016-02-12 NOTE — Progress Notes (Signed)
PARENTERAL NUTRITION CONSULT NOTE -  FOLLOW UP  Pharmacy Consult:  TPN Indication:  Intractable nausea and vomiting  Patient Measurements: Height: 5\' 1"  (154.9 cm) Weight: 134 lb 0.6 oz (60.8 kg) IBW/kg (Calculated) : 47.8   Insulin Requirements in the past 24 hours:  SSI d/c'ed 02/10/16  Assessment: 41 YOF with history of recent diagnosis of adenocarcinoma of the stomach and is s/p gastrectomy earlier this month.  Patient presented with intractable nausea and vomiting.  Per family, patient has struggled with poor PO intake for the past week and a half and has now been unable to keep anything down for the past 2 days.  She suffered about a 10 lb weight loss over last 1-2 weeks.  Pharmacy consulted to mange TPN for intractable nausea and vomiting.  GI: GERD / anal fissure / gastric cancer.  EGD showed esophagitis, gastric ulcer at gastric anastomosis.  Prealbumin slightly low at 14.1.  Nausea is improving, tolerating diet but not yet ready to start weaning per Surgery. Plan is to stay on liquids only for 2 weeks. BM x7 (chronic from radiation per family) Endo: hypothyroid on Synthroid.  No hx DM - CBGs well controlled with minimal SSI use, so SSI d/c'ed 6/6 Lytes: all WNL Renal: SCr stable, CrCL 48 ml/min, BUN WNL - good UOP 0.7 ml/kg/hr Pulm: stable on RA Cards: PAF / CAD / AS / tachy-brady syndrome post PPM / HTN / HLD / CHF, new Afib - BP trending down, HR controlled on Lopressor, diltiazem gtt > PO Hepatobil: LFTs / tbili / TG WNL. Heme/Onc: hx uterine/kidney/gastric cancer - mild anemia, plts WNL AC: Heparin/Coumadin for Afib, no bleeding Neuro: neuropathy / RLS / migraine / anxiety / hx CVA - Lyrica ID: S/p 5 days of LVQ switched to Augmentin for Enterococal and Citrobacter UTI and possible PNA + Fluc D#6 (6/3 >> ) for oral thrush - afebrile, WBC WNL Best Practices: heparin gtt TPN Access: PICC double lumen placed 02/06/16 TPN start date: 02/06/16  Current Nutrition:  Bariatric full  liquid - eating 100% of meals TPN  Nutritional Goals:  1600-1800 kCal and 75-90 gm protein per day   Plan:  - Continue Clinimix E 5/15 at 70 ml/hr + lipids at 10 ml/hr. TPN provides 1672 kCal and 84 gm protein per day, meeting 100% of patient's needs. - Daily multivitamin and trace elements in TPN - D/C Pepcid from TPN since MD started PO Protonix - F/U PO intake and diet advancement to start weaning TPN. Per surgery, plan is to stay on liquids only for 2 weeks    Albertina Parr, PharmD., BCPS Clinical Pharmacist Pager [phone]

## 2016-02-12 NOTE — Progress Notes (Signed)
[facility]  Patient Status:   New pt for  [facility] this admission  [facility] is providing the following services: HHRN and Home TPN pharmacy team for home TPN is needed at time of DC to home. [facility] hospital infusion coordinator has worked with the patients daughters regarding TPN teaching to prepare for independence at home.   If patient discharges after hours, please call [phone].   [name] 02/12/2016, 11:02 PM

## 2016-02-13 DIAGNOSIS — E860 Dehydration: Secondary | ICD-10-CM

## 2016-02-13 DIAGNOSIS — G43A1 Cyclical vomiting, intractable: Secondary | ICD-10-CM

## 2016-02-13 LAB — BASIC METABOLIC PANEL
ANION GAP: 7 (ref 5–15)
BUN: 18 mg/dL (ref 6–20)
CALCIUM: 9.1 mg/dL (ref 8.9–10.3)
CO2: 23 mmol/L (ref 22–32)
Chloride: 110 mmol/L (ref 101–111)
Creatinine, Ser: 0.79 mg/dL (ref 0.44–1.00)
GLUCOSE: 124 mg/dL — AB (ref 65–99)
POTASSIUM: 3.7 mmol/L (ref 3.5–5.1)
SODIUM: 140 mmol/L (ref 135–145)

## 2016-02-13 LAB — CBC
HEMATOCRIT: 37.4 % (ref 36.0–46.0)
HEMOGLOBIN: 11.7 g/dL — AB (ref 12.0–15.0)
MCH: 29.4 pg (ref 26.0–34.0)
MCHC: 31.3 g/dL (ref 30.0–36.0)
MCV: 94 fL (ref 78.0–100.0)
Platelets: 168 10*3/uL (ref 150–400)
RBC: 3.98 MIL/uL (ref 3.87–5.11)
RDW: 18.1 % — ABNORMAL HIGH (ref 11.5–15.5)
WBC: 6.8 10*3/uL (ref 4.0–10.5)

## 2016-02-13 LAB — HEPARIN LEVEL (UNFRACTIONATED): HEPARIN UNFRACTIONATED: 0.76 [IU]/mL — AB (ref 0.30–0.70)

## 2016-02-13 LAB — PROTIME-INR
INR: 2.02 — AB (ref 0.00–1.49)
PROTHROMBIN TIME: 22.7 s — AB (ref 11.6–15.2)

## 2016-02-13 MED ORDER — WARFARIN SODIUM 3 MG PO TABS: 3.0000 mg | ORAL_TABLET | Freq: Once | ORAL | Status: DC

## 2016-02-13 MED ORDER — ACETAMINOPHEN 325 MG PO TABS: 325.0000 mg | ORAL_TABLET | Freq: Four times a day (QID) | ORAL | Status: DC | PRN

## 2016-02-13 MED ORDER — DILTIAZEM HCL ER COATED BEADS 180 MG PO CP24: 180.0000 mg | ORAL_CAPSULE | Freq: Every day | ORAL | Status: DC

## 2016-02-13 MED ORDER — METOPROLOL TARTRATE 50 MG PO TABS: 50.0000 mg | ORAL_TABLET | Freq: Two times a day (BID) | ORAL | Status: DC

## 2016-02-13 MED ORDER — LEVOTHYROXINE SODIUM 100 MCG PO TABS: ORAL_TABLET | ORAL | Status: DC

## 2016-02-13 MED ORDER — METOPROLOL TARTRATE 50 MG PO TABS
50.0000 mg | ORAL_TABLET | Freq: Once | ORAL | Status: AC
Start: 2016-02-13 — End: 2016-02-13
  Administered 2016-02-13: 50 mg via ORAL
  Filled 2016-02-13: qty 1

## 2016-02-13 MED ORDER — METOPROLOL TARTRATE 100 MG PO TABS: 100.0000 mg | ORAL_TABLET | Freq: Two times a day (BID) | ORAL | Status: DC

## 2016-02-13 NOTE — Progress Notes (Signed)
PATIENT ID: [name] is a very pleasant, 12F with paroxysmal atrial fibrillation, hypertension, hyperlipidemia, mild-moderate AS, moderate AR, moderate MS, prior CVA, CKD III, PDA repair and recent subtotal gatastrectomy for adenocarcinoma of the stomach.  SUBJECTIVE:  [name] is feeling well this am.  Ready to go home.   PHYSICAL EXAM Filed Vitals:   02/12/16 2322 02/13/16 0547 02/13/16 1003 02/13/16 1325  BP: 158/65 169/79 154/78 144/65  Pulse: 79 96 92 78  Temp:  97.9 F (36.6 C)  98.3 F (36.8 C)  TempSrc:  Oral  Oral  Resp:  18  18  Height:      Weight:      SpO2:  99%  98%   General:  Well-appearing.  No acute distress.  Neck: No JVP  Lungs:  CTAB.  No crackles, rhonchi or wheezes Heart:  Irregularly irregular.  III/VI systolic and II/IV diastolic murmurs. No rubs or galops.  Abdomen:  Soft, NT, ND.  +BS Extremities:  WWP.  No edema.   LABS: Lab Results  Component Value Date   TROPONINI <0.30 08/03/2014   Results for orders placed or performed during the hospital encounter of 02/03/16 (from the past 24 hour(s))  Heparin level (unfractionated)     Status: Abnormal   Collection Time: 02/12/16  4:11 PM  Result Value Ref Range   Heparin Unfractionated 0.71 (H) 0.30 - 0.70 IU/mL  Heparin level (unfractionated)     Status: Abnormal   Collection Time: 02/13/16  4:20 AM  Result Value Ref Range   Heparin Unfractionated 0.76 (H) 0.30 - 0.70 IU/mL  Basic metabolic panel     Status: Abnormal   Collection Time: 02/13/16  4:20 AM  Result Value Ref Range   Sodium 140 135 - 145 mmol/L   Potassium 3.7 3.5 - 5.1 mmol/L   Chloride 110 101 - 111 mmol/L   CO2 23 22 - 32 mmol/L   Glucose, Bld 124 (H) 65 - 99 mg/dL   BUN 18 6 - 20 mg/dL   Creatinine, Ser 0.79 0.44 - 1.00 mg/dL   Calcium 9.1 8.9 - 10.3 mg/dL   GFR calc non Af Amer >60 >60 mL/min   GFR calc Af Amer >60 >60 mL/min   Anion gap 7 5 - 15  CBC     Status: Abnormal   Collection Time: 02/13/16  4:20 AM    Result Value Ref Range   WBC 6.8 4.0 - 10.5 K/uL   RBC 3.98 3.87 - 5.11 MIL/uL   Hemoglobin 11.7 (L) 12.0 - 15.0 g/dL   HCT 37.4 36.0 - 46.0 %   MCV 94.0 78.0 - 100.0 fL   MCH 29.4 26.0 - 34.0 pg   MCHC 31.3 30.0 - 36.0 g/dL   RDW 18.1 (H) 11.5 - 15.5 %   Platelets 168 150 - 400 K/uL  Protime-INR     Status: Abnormal   Collection Time: 02/13/16  4:20 AM  Result Value Ref Range   Prothrombin Time 22.7 (H) 11.6 - 15.2 seconds   INR 2.02 (H) 0.00 - 1.49    Intake/Output Summary (Last 24 hours) at 02/13/16 1328 Last data filed at 02/13/16 1325  Gross per 24 hour  Intake    360 ml  Output   2204 ml  Net  -1844 ml    Telemetry: atrial fibrillation rates mostly 90s.   ASSESSMENT AND PLAN:  Principal Problem:   Intractable nausea and vomiting Active Problems:   Hypothyroidism   Essential hypertension  Vitamin B12 deficiency   Adenocarcinoma of stomach (HCC)   Persistent atrial fibrillation (HCC)   Pressure ulcer   Hypernatremia   Hypokalemia   Dehydration   Esophagitis   Gastroesophageal reflux disease with esophagitis   Uncontrollable vomiting   Urinary tract infectious disease   Post gastrectomy syndrome   Erosive esophagitis   # Persistent atrial fibrillation: [name]'s heart rate is well-controlled on diltiazem and metoprolol.  However, it is mostly in the 90s and her BP is above goal. We will increase metoprolol to 100 mg twice a day. Continue diltiazem CD 180 mg daily.   Eulogio Bear is at goal. Heparin has been discontinued.s patients CHA2DS2-VASc Score and unadjusted Ischemic Stroke Rate (% per year) is equal to 9.7 % stroke rate/year from a score of 6  Above score calculated as 1 point each if present [CHF, HTN, DM, Vascular=MI/PAD/Aortic Plaque, Age if 65-74, or Female] Above score calculated as 2 points each if present [Age > 75, or Stroke/TIA/TE]   # Hypertension:  Her home losartan has been held. Continue metoprolol and diltiazem as above.   # CAD:  Non-obstructive disease and negative stress 11/2015.   Dispo: [name] is stable for discharge from a cardiology standpoint. Follow-up in the atrial fibrillation clinic has been made.    Danali Marinos C. Oval Linsey, MD, [facility] 02/13/2016 1:28 PM

## 2016-02-13 NOTE — Progress Notes (Signed)
Patient ID: AERILYN CAPARAS, female   DOB: 09-27-1933, 82 y.o.   MRN: [id number] 8 Days Post-Op   Subjective: Had some more indigestion again.    Objective: Vital signs in last 24 hours: Temp:  [97.9 F (36.6 C)-98.1 F (36.7 C)] 97.9 F (36.6 C) (06/09 0547) Pulse Rate:  [79-96] 92 (06/09 1003) Resp:  [18] 18 (06/09 0547) BP: (154-169)/(63-79) 154/78 mmHg (06/09 1003) SpO2:  [98 %-99 %] 99 % (06/09 0547) Last BM Date: 02/12/16  Intake/Output from previous day: 06/08 0701 - 06/09 0700 In: 120 [P.O.:120] Out: 1452 [Urine:1450; Stool:2] Intake/Output this shift: Total I/O In: 240 [P.O.:240] Out: 251 [Urine:250; Stool:1]  General appearance: Alert and pleasant.  No obvious distress. Family in room.  Alert and oriented. Resp: clear to auscultation bilaterally GI: Abdomen soft and nontender.  Incisions well-healed.  Lab Results:   Recent Labs  02/12/16 0505 02/13/16 0420  WBC 7.0 6.8  HGB 12.1 11.7*  HCT 38.4 37.4  PLT 183 168   BMET  Recent Labs  02/12/16 0505 02/13/16 0420  NA 139 140  K 3.8 3.7  CL 108 110  CO2 24 23  GLUCOSE 118* 124*  BUN 17 18  CREATININE 0.74 0.79  CALCIUM 9.4 9.1   PT/INR  Recent Labs  02/12/16 0505 02/13/16 0420  LABPROT 20.4* 22.7*  INR 1.75* 2.02*   ABG No results for input(s): PHART, HCO3 in the last 72 hours.  Invalid input(s): PCO2, PO2  Studies/Results: No results found.  Anti-infectives: Anti-infectives    Start     Dose/Rate Route Frequency Ordered Stop   02/11/16 2200  amoxicillin-clavulanate (AUGMENTIN) 500-125 MG per tablet 500 mg     1 tablet Oral 2 times daily 02/11/16 1601     02/07/16 1445  levofloxacin (LEVAQUIN) IVPB 750 mg  Status:  Discontinued     750 mg 100 mL/hr over 90 Minutes Intravenous Every 48 hours 02/07/16 1424 02/11/16 1601   02/07/16 0815  fluconazole (DIFLUCAN) IVPB 100 mg     100 mg 50 mL/hr over 60 Minutes Intravenous Every 24 hours 02/07/16 0813     02/06/16 1500  azithromycin  (ZITHROMAX) 500 mg in dextrose 5 % 250 mL IVPB  Status:  Discontinued     500 mg 250 mL/hr over 60 Minutes Intravenous Every 24 hours 02/06/16 1454 02/07/16 1424   02/04/16 1700  cefTRIAXone (ROCEPHIN) 1 g in dextrose 5 % 50 mL IVPB  Status:  Discontinued     1 g 100 mL/hr over 30 Minutes Intravenous Every 24 hours 02/04/16 1553 02/07/16 1424      Assessment/Plan: s/p Procedure(s): ESOPHAGOGASTRODUODENOSCOPY (EGD)  Adenocarcinoma of stomach, status post subtotal gastrectomy 01/15/2016 Esophagitis -  on twice a day Protonix. Add GI cocktail  Stay on liquids only for 2 weeks TNA for protein calorie malnutrition. Oral intake is improving.  If caloric intake is 70+ % of caloric needs, can wean TNA and she can go home without it.  She is likely still only about 50% of her caloric needs and could use the catch up on the calories.  Home when stable from cardiac standpoint.    Diarrhea. Family reports this is somewhat chronic due to radiation therapy.  C. difficile negative.  CAD. History CABG. Pacemaker. Home when stable.    Hypokalemia. resolved  UTI - on levaquin.  Cultures show enterococcus and Citrobacter.  Appears sensitive to quinolones.  Thrush - on diflucan   LOS: 7 days    Alison Breeding 02/13/2016

## 2016-02-13 NOTE — Progress Notes (Signed)
ANTICOAGULATION CONSULT NOTE - Follow Up Consult  Pharmacy Consult for warfarin Indication: atrial fibrillation  Allergies  Allergen Reactions  . Amiodarone      Thyroid and liver and kidney problems  . Codeine Nausea And Vomiting  . Gabapentin Itching  . Hydrocodone Other (See Comments)    hallucination  . Morphine And Related Nausea And Vomiting  . Requip [Ropinirole Hcl]     Headache   . Zinc     nausea    Patient Measurements: Height: 5\' 1"  (154.9 cm) Weight: 134 lb 0.6 oz (60.8 kg) IBW/kg (Calculated) : 47.8  Vital Signs: Temp: 97.9 F (36.6 C) (06/09 0547) Temp Source: Oral (06/09 0547) BP: 169/79 mmHg (06/09 0547) Pulse Rate: 96 (06/09 0547)  Labs:  Recent Labs  02/11/16 0508  02/12/16 0505 02/12/16 1611 02/13/16 0420  HGB 11.8*  --  12.1  --  11.7*  HCT 37.7  --  38.4  --  37.4  PLT 195  --  183  --  168  LABPROT 17.0*  --  20.4*  --  22.7*  INR 1.38  --  1.75*  --  2.02*  HEPARINUNFRC 0.20*  < > 0.99* 0.71* 0.76*  CREATININE 0.77  --  0.74  --  0.79  < > = values in this interval not displayed.  Estimated Creatinine Clearance: 46.9 mL/min (by C-G formula based on Cr of 0.79).   Assessment: 81yof on coumadin pta for afib, admitted 5/30 with N/V. INR 2.84 on admission now s/p INR reversal and EGD. Warfarin restarted 6/4 per Rx.  INR is therapeutic at 2.02 with somewhat of a quick trend up - heparin drip d/c'd this morning. Of note pt is on fluconazole which may increase the INR. She completed levaquin 6/7 which can also increase INR.    Last pta dose documented: 3mg  on MWF, 2mg  AOD  Goal of Therapy:  INR 2-3 Monitor platelets by anticoagulation protocol: Yes   Plan:  - Warfarin 3 mg PO tonight - Daily INR - Monitor for s/sx of bleeding  [facility], Pharm.D., BCPS Clinical Pharmacist Pager: [phone] 02/13/2016 8:38 AM

## 2016-02-13 NOTE — Care Management Note (Signed)
Case Management Note Marvetta Gibbons RN, BSN Unit 2W-Case Manager [phone]  Patient Details  Name: [name] MRN: [id number] Date of Birth: 1934/09/03  Subjective/Objective:    Pt admitted with N/V- recent gastrectomy started on TPN now weaning off for d/c home                Action/Plan: PTA pt lived at home- referral had been previously made to  [facility] for [facility] services during this admission- see previous CM notes for choice offered- [facility] has been following for possible home TPN- pt will now not need home TPN- orders have been written for HH-RN/PT/aide- [facility] will follow pt closely in the home on diet intake also. Pt to return home with her daughters.   Expected Discharge Date:    02/13/16              Expected Discharge Plan:  [place]  In-House Referral:     Discharge planning Services  CM Consult  Post Acute Care Choice:  Home Health Choice offered to:  Patient, Adult Children  DME Arranged:    DME Agency:     HH Arranged:  RN, PT, Nurse's Aide, Speech Therapy HH Agency:  [facility]  Status of Service:  Completed, signed off  Medicare Important Message Given:  Yes Date Medicare IM Given:    Medicare IM give by:    Date Additional Medicare IM Given:    Additional Medicare Important Message give by:     If discussed at [place] of Stay Meetings, dates discussed:    Discharge Disposition: Home with Home Health   Additional Comments:  Dawayne Patricia, RN 02/13/2016, 2:13 PM

## 2016-02-13 NOTE — Consult Note (Signed)
   [facility] CM Inpatient Consult   02/13/2016  [name] 11-May-1934 [id number]   Patient screened for [place] Management services. Went to bedside to offer and explain [facility] Care Management program with patient. Spoke with patient and daughter. Both patient and daughter declined [place] Management follow up. Ms. [place] daughter states, " we have [place] coming out and we do not need any additional services at this time". Explained that [place] Management home visit could start after [facility] visits conclude if that was what they wanted. They still pleasantly declined [facility] Care Management. Accepted [facility] Care Management brochure with contact information to call in future if changes mind. Will make inpatient RNCM aware that patient declined [place] Management program services.  Marthenia Rolling, MSN-Ed, RN,BSN [facility] Liaison [phone]

## 2016-02-13 NOTE — Progress Notes (Signed)
Patient discharged home with daughters. Medications and discharge instructions were educated on and they stated that they understood. IV was dc'd and was intact.

## 2016-02-13 NOTE — Discharge Summary (Signed)
[name], is a 82 y.o. female  DOB 1933-11-04  MRN [id number].  Admission date:  02/03/2016  Admitting Physician  [name], MD  Discharge Date:  02/13/2016   Primary MD  [name], MD  Recommendations for primary care physician for things to follow:   Patient is being discharged home with instructions follow-up with her physician within one week, patient will need follow-up on his INR. Patient has been placed on AV  blockade with diltiazem and metoprolol. PICC line will be removed and TPN will be stopped.   Admission Diagnosis  S/P partial gastrectomy [Z90.3] Esophagitis [K20.9] Intractable vomiting with nausea, vomiting of unspecified type [R11.10]   Discharge Diagnosis  S/P partial gastrectomy [Z90.3] Esophagitis [K20.9] Intractable vomiting with nausea, vomiting of unspecified type [R11.10]   Atrial fibrillation with rapid ventricular response.  Principal Problem:   Intractable nausea and vomiting Active Problems:   Hypothyroidism   Essential hypertension   Vitamin B12 deficiency   Adenocarcinoma of stomach (HCC)   Persistent atrial fibrillation (HCC)   Pressure ulcer   Hypernatremia   Hypokalemia   Dehydration   Esophagitis   Gastroesophageal reflux disease with esophagitis   Uncontrollable vomiting   Urinary tract infectious disease   Post gastrectomy syndrome   Erosive esophagitis      Past Medical History  Diagnosis Date  . PAF (paroxysmal atrial fibrillation) ([place])   . CAD (coronary artery disease)   . Aortic root dilatation ([place])   . Moderate aortic stenosis   . Tachycardia-bradycardia syndrome ([place])     s/p PPM by Dr Doreatha Lew (MDT) 07/03/10  . GERD (gastroesophageal reflux disease)   . Neuropathy ([place])     feet/legs  . Restless leg syndrome   . History of uterine cancer 1980s    treated with hysterectomy, external radiation and radiation seed  implants.   . Cancer ([place])     hx Kidney cancer / hx of endometrial cancer  . Anxiety   . HTN (hypertension)   . Hyperlipemia   . Hypothyroidism   . Osteomyelitis ([place]) as child  . Elevated LFTs     hepatic steatosis  . Gastroparesis   . Anal fissure   . Radiation proctitis   . Neuromuscular disorder (HCC)     neuropathy in right leg  . Adenocarcinoma of stomach ([place]) 11/11/2015    gastric mass on egd  . CHF (congestive heart failure) ([place])   . Chronic kidney disease     only has one kidney rt lft rem 03 ca  . 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 Anemia     hx 3/17 iron infusion, on aranesp and injected B12 since 11/2015.   . Stroke ([place]) 15    partial stroke left legally blind    Past Surgical History  Procedure Laterality Date  . Cardiac catheterization  2008  . Nephrectomy Left     renal cell cancer  . Patent ductus arterious repair  ~ 1949  . Cholecystectomy  1970s  . Upper gastrointestinal endoscopy    .  Total knee arthroplasty  02/07/2012    Procedure: TOTAL KNEE ARTHROPLASTY;  Surgeon: Mauri Pole, MD;  Location: [facility];  Service: Orthopedics;  Laterality: Right;  . Total hip revision Right 2009    initial replacment surg  . I&d knee with poly exchange Right 12/17/2013    Procedure: IRRIGATION AND DEBRIDEMEN RIGHT TOTAL  KNEE WITH POLY EXCHANGE;  Surgeon: Mauri Pole, MD;  Location: [facility];  Service: Orthopedics;  Laterality: Right;  . Patellar tendon repair Right 03/06/2014    Procedure: AVULSION WITH PATELLA TENDON REPAIR;  Surgeon: Mauri Pole, MD;  Location: [facility];  Service: Orthopedics;  Laterality: Right;  . Total knee revision Right 07/29/2014    Procedure: RIGHT KNEE REVISION OF PREVIOUS REPAIR EXTENSOR MECHANISM;  Surgeon: Mauri Pole, MD;  Location: [facility];  Service: Orthopedics;  Laterality: Right;  . Colonoscopy N/A 11/11/2015    Procedure: COLONOSCOPY;  Surgeon: [name], MD;  [facility]: [facility];  Service: Endoscopy;  Laterality: N/A;  .  Esophagogastroduodenoscopy N/A 11/11/2015    Procedure: ESOPHAGOGASTRODUODENOSCOPY (EGD);  Surgeon: [name], MD;  Location: [facility];  Service: Endoscopy;  Laterality: N/A;  . Total abdominal hysterectomy  87  . Laparoscopy N/A 01/15/2016    Procedure: LAPAROSCOPY DIAGNOSTIC;  Surgeon: Stark Klein, MD;  Location: [facility];  Service: General;  Laterality: N/A;  . Gastrectomy N/A 01/15/2016    Procedure: OPEN PARTIAL GASTRECTOMY;  Surgeon: Stark Klein, MD;  Location: [facility];  Service: General;  Laterality: N/A;  . Pacemaker insertion  07/04/11    MDT PPM implanted by Dr Doreatha Lew for tachy/brady  . Esophagogastroduodenoscopy N/A 02/05/2016    Procedure: ESOPHAGOGASTRODUODENOSCOPY (EGD);  Surgeon: Ladene Artist, MD;  Location: [facility];  Service: Endoscopy;  Laterality: N/A;       HPI  from the history and physical done on the day of admission:    This is an 82 year old female who comes for hospital chief complaint of nausea and vomiting. She had approximately 20% of emesis the night before admission got fevers or frank chest pain. Patient had a partial gastrectomy in May 2017 6 consequence of adenocarcinoma of the stomach. On initial physical examination her systolic blood pressure was 172, RR 14, respiratory rate 29 and oxygen saturation 97%, she was found ill-looking appearing with dry mucous membranes, her lungs were clear to auscultation bilaterally, heart S1-S2 present irregularly irregular with no significant lower extremity edema, her abdomen was soft, there are postsurgical changes, epigastric pain to palpation. Her serum sodium was 148, potassium 3.4, creatinine 1.0 white count 8.4 and he will on 12.6.   Patient was admitted to hospital working diagnosis of intractable nausea and vomiting possibly secondary to gastritis complicated by dehydration.   Hospital Course:   1. Cardiovascular. Patient was initially placed on with IV fluids, with good toleration, no signs of volume  overload. Patient did develop uncontrolled atrial fibrillation with rapid ventricular response. Patient required AV blockade therapy with diltiazem and metoprolol with good response. Patient was continued on anti-coagulation with warfarin. Urinalysis had positive nitrates with the moderate leukocytes, her urinary culture grew Enterobacter and Citrobacter, she was treated with levofloxacin and then transitioned to Augmentin she completed antibiotic therapy in the hospital. Her systemic response is more a presumptive to be related to her a hydration and arrhythmia than a systemic infection, ruling out sepsis.  2. Pulmonary. Patient had oximetry monitor, supplemental oxygen per nasal cannula, no signs of volume overload or pulmonary infection.  3.  Nephrology. Patient kidney function remained stable, patient had her diuretic therapy initially held due to her dehydration at this point patient will resume furosemide and potassium supplements. Due to her age is recommended  to have a close follow-up on kidney function and electrolytes.  4. Gastrointestinal. Patient's symptoms improved with antiacid therapy, diet was progressively advanced. Patient initially was continued on TPN that this point will be discontinued per recommendations from surgery. Patient underwent EGD on February 05 2016 showing grade D reflux esophagitis, nonbleeding gastric ulcers at the anastomosis with no stigmata of bleeding. Patient responded well to proton pump inhibitors.   Discharge Condition: Stable  Follow UP  Follow-up Information    Follow up with [facility].   Why:  HH-RN/PT/SLP/aide arranged (home TPN also in place)   Contact information:   [address] [place] [address] [phone]       Follow up with Silvano Rusk, MD On 03/19/2016.   Specialty:  Gastroenterology   Why:  3PM   Contact information:   [address] [place] [address] [phone]       Follow up with  [name], MD In 1 week.   Specialty:  Internal Medicine   Contact information:   [address] [place]  [address] [phone]        Consults obtained - Surgery, gastroenterology, cardiology  Diet and Activity recommendation: See Discharge Instructions below  Discharge Instructions     Discharge Instructions    Diet - low sodium heart healthy    Complete by:  As directed      Discharge instructions    Complete by:  As directed   Please follow with primary care in 7 days.     Increase activity slowly    Complete by:  As directed              Discharge Medications       Medication List    STOP taking these medications        atenolol 100 MG tablet  Commonly known as:  TENORMIN     diphenoxylate-atropine 2.5-0.025 MG tablet  Commonly known as:  LOMOTIL     guaiFENesin 600 MG 12 hr tablet  Commonly known as:  MUCINEX     losartan 50 MG tablet  Commonly known as:  COZAAR     sucralfate 1 GM/10ML suspension  Commonly known as:  CARAFATE     triamcinolone cream 0.1 %  Commonly known as:  KENALOG      TAKE these medications        acetaminophen 325 MG tablet  Commonly known as:  TYLENOL  Take 1 tablet (325 mg total) by mouth every 6 (six) hours as needed for mild pain, moderate pain, fever or headache.     ALPRAZolam 0.5 MG tablet  Commonly known as:  XANAX  TAKE 1/2-1 TABLET BY MOUTH 3 TIMES A DAY AS NEEDED FOR ANXIETY     B-complex with vitamin C tablet  Take 1 tablet by mouth daily.     Biotin 5000 MCG Caps  Take 5,000 mcg by mouth every morning.     cetirizine 10 MG tablet  Commonly known as:  ZYRTEC  Take 10 mg by mouth at bedtime.     Cyanocobalamin 1000 MCG/ML Kit  Inject 1,000 mcg into the skin as directed.     diltiazem 180 MG 24 hr capsule  Commonly known as:  CARDIZEM CD  Take 1 capsule (180  mg total) by mouth daily.     furosemide 40 MG tablet  Commonly known as:  LASIX  TAKE 1 TABLET (40 MG  TOTAL) BY MOUTH DAILY.     levothyroxine 100 MCG tablet  Commonly known as:  SYNTHROID, LEVOTHROID  TAKE 1/2 TABLET (50 MCG TOTAL) BY MOUTH EVERY DAY ON EMPTY STOMACH WITH A FULL GLASS OF WATER     LYRICA 75 MG capsule  Generic drug:  pregabalin  TAKE ONE CAPSULE BY MOUTH TWICE A DAY     methocarbamol 500 MG tablet  Commonly known as:  ROBAXIN  Take 1 tablet (500 mg total) by mouth every 8 (eight) hours as needed for muscle spasms.     metoprolol 50 MG tablet  Commonly known as:  LOPRESSOR  Take 1 tablet (50 mg total) by mouth 2 (two) times daily.     multivitamin tablet  Take 1 tablet by mouth daily.     nitroGLYCERIN 0.4 MG SL tablet  Commonly known as:  NITROSTAT  Place 1 tablet (0.4 mg total) under the tongue every 5 (five) minutes as needed for chest pain (MAX 3 TABLETS).     ondansetron 4 MG disintegrating tablet  Commonly known as:  ZOFRAN-ODT  Take 1 tablet (4 mg total) by mouth every 6 (six) hours as needed for nausea.     pantoprazole 40 MG tablet  Commonly known as:  PROTONIX  TAKE 1 TABLET (40 MG TOTAL) BY MOUTH 2 (TWO) TIMES DAILY.     potassium chloride SA 20 MEQ tablet  Commonly known as:  K-DUR,KLOR-CON  Take 20 mEq by mouth 2 (two) times daily.     sertraline 50 MG tablet  Commonly known as:  ZOLOFT  Take 1 tablet (50 mg total) by mouth daily. For mood     traMADol 50 MG tablet  Commonly known as:  ULTRAM  Take 1 tablet (50 mg total) by mouth every 6 (six) hours as needed (pain). Reported on 10/14/2015     VITAMIN D PO  Take 5,000 Units by mouth every morning.     warfarin 2 MG tablet  Commonly known as:  COUMADIN  TAKE 1 TABLET DAILY OR AS DIRECTED        Major procedures and Radiology Reports - PLEASE review detailed and final reports for all details, in brief -      Dg Esophagus  02/04/2016  CLINICAL DATA:  Vomiting for 2 days 3 weeks status post partial gastrectomy. Evaluate for food impaction. EXAM: ESOPHOGRAM/BARIUM SWALLOW TECHNIQUE:  Single contrast examination was performed using  thin barium. FLUOROSCOPY TIME:  Radiation Exposure Index (as provided by the fluoroscopic device): 238.9 uGy*m2 If the device does not provide the exposure index: Fluoroscopy Time:  54 seconds Number of Acquired Images:  Fluoro only. COMPARISON:  Postoperative upper GI series 01/23/2016. FINDINGS: Study was performed in the LPO and right lateral decubitus positions. The patient swallowed the barium without difficulty. There is mild presbyesophagus. No evidence of food impaction, mucosal ulceration or stricture. Contrast empties into the gastric pouch and proximal small bowel. No evidence of contrast leak. No laryngeal penetration observed. IMPRESSION: No evidence of food impaction, obstruction or extravasation. Presbyesophagus. Electronically Signed   By: Richardean Sale M.D.   On: 02/04/2016 08:50   Dg Chest Port 1 View  02/06/2016  CLINICAL DATA:  PICC line placement EXAM: PORTABLE CHEST 1 VIEW COMPARISON:  02/06/2015 FINDINGS: LEFT-sided PICC line placed with tip distal SVC. Stable cardiac silhouette. Small LEFT effusion. Mild  central venous pulmonary congestion. No pneumothorax IMPRESSION: 1. PICC line in proper position. 2. LEFT effusion insert venous congestion. Electronically Signed   By: Suzy Bouchard M.D.   On: 02/06/2016 16:57   Dg Chest Port 1 View  02/06/2016  CLINICAL DATA:  Coughing.  Fever and rule out pneumonia. EXAM: PORTABLE CHEST 1 VIEW COMPARISON:  02/03/2016.  PET-CT 12/01/2015 FINDINGS: Stable position of the right dual chamber cardiac pacemaker. New or increased densities at the left lung base. Right lung remains clear. Heart size is grossly stable and within normal limits. Atherosclerotic calcifications at the aortic arch. No acute bone abnormality. IMPRESSION: Increased densities at the left lung base are suggestive for pleural fluid and consolidation. Electronically Signed   By: Markus Daft M.D.   On: 02/06/2016 11:45   Dg Abd Acute  W/chest  02/03/2016  CLINICAL DATA:  Vomiting after stomach surgery 01/15/2016. Partial gastrectomy for cancer. EXAM: DG ABDOMEN ACUTE W/ 1V CHEST COMPARISON:  11/12/2015 abdominal CT FINDINGS: Sutures in the left upper quadrant correlating with partial gastrectomy. Study is limited by gasless abdomen. No dilated loops are seen. No evidence of pneumoperitoneum. Trace left effusion. No pneumonia or edema. No cardiomegaly. Dual-chamber pacer from the right with leads in unremarkable position. IMPRESSION: 1. Gasless abdomen, a nonspecific finding that limits sensitivity. No indication of bowel obstruction or perforation. 2. Trace left effusion. Electronically Signed   By: Monte Fantasia M.D.   On: 02/03/2016 18:21   Dg Duanne Limerick W/water Sol Cm  01/23/2016  CLINICAL DATA:  Status post partial gastrectomy.  Evaluate for leak. EXAM: WATER SOLUBLE UPPER GI SERIES TECHNIQUE: Single-column upper GI series was performed using water soluble contrast. CONTRAST:  1 ISOVUE-300 IOPAMIDOL (ISOVUE-300) INJECTION 61% COMPARISON:  01/18/2016 FLUOROSCOPY TIME:  Radiation Exposure Index (as provided by the fluoroscopic device): Not available If the device does not provide the exposure index: Fluoroscopy Time (in minutes and seconds):  1 minutes 18 seconds Number of Acquired Images:  1, the rest saved fluoroscopy FINDINGS: Scout images over the chest epigastrium were obtained. Patient had to take swallows LPO. In this position there was no emptying of the remnant stomach with frequent large volume gastroesophageal reflux. Edema and obstruction at the gastro enterostomy with expected, but when supine and RPO the stomach drained readily. Negative for leak. The duodenal C-loop opacified and is unremarkable. IMPRESSION: 1. Negative for postoperative leak or obstruction. 2. Positional drainage of the remnant stomach. When LPO there was no drainage and large volume, symptomatic gastroesophageal reflux. 3. Distal esophagitis related to #2 or  recent NG tube. Electronically Signed   By: Monte Fantasia M.D.   On: 01/23/2016 09:49   Dg Duanne Limerick W/water Sol Cm  01/18/2016  CLINICAL DATA:  82 year old female status post partial gastrectomy. Evaluate for potential postoperative leak or obstruction. EXAM: WATER SOLUBLE UPPER GI SERIES TECHNIQUE: Single-column upper GI series was performed using water soluble contrast. CONTRAST:  60 mL of Gastrografin COMPARISON:  No priors. FLUOROSCOPY TIME:  If the device does not provide the exposure index: Fluoroscopy Time (in minutes and seconds):  1 minutes and 18 seconds Number of Acquired Images:  5 series with multiple images. FINDINGS: Initial image demonstrated a nasogastric a tube in place with tip in the gastric remnant and side-port in the distal esophagus. Midline skin staples are noted. Pacemaker leads extending over the expected location of the right atrial appendage and right ventricular apex. Gastrografin was infused via the patient's indwelling NG tube, which demonstrated a small gastric remnant. There  was gastroesophageal reflux into the esophagus throughout the examination. Contrast readily traversed the pylorus into the proximal small bowel. There was no extravasation of contrast material at any point during the examination. IMPRESSION: 1. Expected postoperative appearance of the stomach following partial gastrectomy, without evidence of postoperative leak or obstruction. These results will be called to the ordering clinician or representative by the Radiologist Assistant, and communication documented in the PACS or zVision Dashboard. Electronically Signed   By: [name] M.D.   On: 01/18/2016 15:59    Micro Results     Recent Results (from the past 240 hour(s))  Urine culture     Status: Abnormal   Collection Time: 02/04/16  6:35 PM  Result Value Ref Range Status   Specimen Description URINE, CLEAN CATCH  Final   Special Requests Normal  Final   Culture (A)  Final    50,000 COLONIES/mL  ENTEROCOCCUS SPECIES 50,000 COLONIES/mL CITROBACTER FREUNDII    Report Status 02/07/2016 FINAL  Final   Organism ID, Bacteria ENTEROCOCCUS SPECIES (A)  Final   Organism ID, Bacteria CITROBACTER FREUNDII (A)  Final      Susceptibility   Citrobacter freundii - MIC*    CEFAZOLIN >=64 RESISTANT Resistant     CEFTRIAXONE <=1 SENSITIVE Sensitive     CIPROFLOXACIN <=0.25 SENSITIVE Sensitive     GENTAMICIN <=1 SENSITIVE Sensitive     IMIPENEM 0.5 SENSITIVE Sensitive     NITROFURANTOIN <=16 SENSITIVE Sensitive     TRIMETH/SULFA <=20 SENSITIVE Sensitive     PIP/TAZO <=4 SENSITIVE Sensitive     * 50,000 COLONIES/mL CITROBACTER FREUNDII   Enterococcus species - MIC*    AMPICILLIN <=2 SENSITIVE Sensitive     LEVOFLOXACIN 1 SENSITIVE Sensitive     NITROFURANTOIN <=16 SENSITIVE Sensitive     VANCOMYCIN 1 SENSITIVE Sensitive     * 50,000 COLONIES/mL ENTEROCOCCUS SPECIES  Culture, blood (routine x 2)     Status: None   Collection Time: 02/06/16  8:15 AM  Result Value Ref Range Status   Specimen Description BLOOD RIGHT ANTECUBITAL  Final   Special Requests BOTTLES DRAWN AEROBIC ONLY 8CC  Final   Culture NO GROWTH 5 DAYS  Final   Report Status 02/11/2016 FINAL  Final  Culture, blood (routine x 2)     Status: None   Collection Time: 02/06/16  8:25 AM  Result Value Ref Range Status   Specimen Description BLOOD LEFT HAND  Final   Special Requests IN PEDIATRIC BOTTLE 3CC  Final   Culture NO GROWTH 5 DAYS  Final   Report Status 02/11/2016 FINAL  Final  C difficile quick scan w PCR reflex     Status: None   Collection Time: 02/07/16  6:07 PM  Result Value Ref Range Status   C Diff antigen NEGATIVE NEGATIVE Final   C Diff toxin NEGATIVE NEGATIVE Final   C Diff interpretation Negative for toxigenic C. difficile  Final       Today   Subjective    Janelis Stelzer is feeling much better, tolerating diet by mouth adequately, no chest pain, shortness of breath, no nausea vomiting, or  palpitations.   Objective   Blood pressure 154/78, pulse 92, temperature 97.9 F (36.6 C), temperature source Oral, resp. rate 18, height _0  (1.549 m), weight 60.8 kg (134 lb 0.6 oz), SpO2 99 %.   Intake/Output Summary (Last 24 hours) at 02/13/16 1247 Last data filed at 02/13/16 0900  Gross per 24 hour  Intake    240  ml  Output   1703 ml  Net  -1463 ml    Exam Gen. Awake and alert Oral mucosa moist Lungs clear to auscultation bilaterally with mild decreased breath sounds at bases Heart S1-S2 present irregularly irregular, with no gallops. Positive systolic murmur at the apex 3 out of 6 Abdomen soft nontender nondistended Excellent is no edema Neuro exam nonfocal   Data Review   CBC w Diff: Lab Results  Component Value Date   WBC 6.8 02/13/2016   WBC 6.7 01/02/2016   HGB 11.7* 02/13/2016   HGB 12.1 01/02/2016   HCT 37.4 02/13/2016   HCT 37.8 01/02/2016   PLT 168 02/13/2016   PLT 200 01/02/2016   LYMPHOPCT 24 02/09/2016   LYMPHOPCT 22.2 01/02/2016   MONOPCT 9 02/09/2016   MONOPCT 7.8 01/02/2016   EOSPCT 9 02/09/2016   EOSPCT 3.8 01/02/2016   BASOPCT 0 02/09/2016   BASOPCT 1.2 01/02/2016    CMP: Lab Results  Component Value Date   NA 140 02/13/2016   NA 143 12/08/2015   K 3.7 02/13/2016   K 4.2 12/08/2015   CL 110 02/13/2016   CO2 23 02/13/2016   CO2 25 12/08/2015   BUN 18 02/13/2016   BUN 25.3 12/08/2015   CREATININE 0.79 02/13/2016   CREATININE 1.1 12/08/2015   CREATININE 0.92 11/18/2015   PROT 6.0* 02/12/2016   PROT 7.0 12/08/2015   ALBUMIN 3.1* 02/12/2016   ALBUMIN 3.1* 12/08/2015   BILITOT 0.9 02/12/2016   BILITOT <0.30 12/08/2015   ALKPHOS 78 02/12/2016   ALKPHOS 104 12/08/2015   AST 18 02/12/2016   AST 16 12/08/2015   ALT 14 02/12/2016   ALT 10 12/08/2015  .   Total Time in preparing paper work, data evaluation and todays exam - 45 minutes  Tawni Millers M.D on 02/13/2016 at 12:47 PM  [facility]   Office   [phone]

## 2016-02-14 DIAGNOSIS — Z483 Aftercare following surgery for neoplasm: Secondary | ICD-10-CM | POA: Diagnosis not present

## 2016-02-14 DIAGNOSIS — R1310 Dysphagia, unspecified: Secondary | ICD-10-CM | POA: Diagnosis not present

## 2016-02-14 DIAGNOSIS — Z7901 Long term (current) use of anticoagulants: Secondary | ICD-10-CM | POA: Diagnosis not present

## 2016-02-14 DIAGNOSIS — C169 Malignant neoplasm of stomach, unspecified: Secondary | ICD-10-CM | POA: Diagnosis not present

## 2016-02-14 DIAGNOSIS — I482 Chronic atrial fibrillation: Secondary | ICD-10-CM | POA: Diagnosis not present

## 2016-02-16 DIAGNOSIS — R1310 Dysphagia, unspecified: Secondary | ICD-10-CM | POA: Diagnosis not present

## 2016-02-16 DIAGNOSIS — Z7901 Long term (current) use of anticoagulants: Secondary | ICD-10-CM | POA: Diagnosis not present

## 2016-02-16 DIAGNOSIS — I482 Chronic atrial fibrillation: Secondary | ICD-10-CM | POA: Diagnosis not present

## 2016-02-16 DIAGNOSIS — Z483 Aftercare following surgery for neoplasm: Secondary | ICD-10-CM | POA: Diagnosis not present

## 2016-02-16 DIAGNOSIS — C169 Malignant neoplasm of stomach, unspecified: Secondary | ICD-10-CM | POA: Diagnosis not present

## 2016-02-17 ENCOUNTER — Inpatient Hospital Stay (HOSPITAL_COMMUNITY)
Admission: EM | Admit: 2016-02-17 | Discharge: 2016-03-16 | DRG: 392 | Disposition: A | Discharge status: Home Health | Payer: Medicare Other | Attending: Internal Medicine | Admitting: Internal Medicine

## 2016-02-17 ENCOUNTER — Encounter (HOSPITAL_COMMUNITY): Payer: Self-pay

## 2016-02-17 ENCOUNTER — Emergency Department (HOSPITAL_COMMUNITY): Payer: Medicare Other

## 2016-02-17 DIAGNOSIS — G43A1 Cyclical vomiting, intractable: Secondary | ICD-10-CM

## 2016-02-17 DIAGNOSIS — K760 Fatty (change of) liver, not elsewhere classified: Secondary | ICD-10-CM | POA: Diagnosis present

## 2016-02-17 DIAGNOSIS — R110 Nausea: Secondary | ICD-10-CM

## 2016-02-17 DIAGNOSIS — E440 Moderate protein-calorie malnutrition: Secondary | ICD-10-CM | POA: Diagnosis not present

## 2016-02-17 DIAGNOSIS — N179 Acute kidney failure, unspecified: Secondary | ICD-10-CM

## 2016-02-17 DIAGNOSIS — G2581 Restless legs syndrome: Secondary | ICD-10-CM | POA: Diagnosis present

## 2016-02-17 DIAGNOSIS — I350 Nonrheumatic aortic (valve) stenosis: Secondary | ICD-10-CM | POA: Diagnosis not present

## 2016-02-17 DIAGNOSIS — I2510 Atherosclerotic heart disease of native coronary artery without angina pectoris: Secondary | ICD-10-CM | POA: Diagnosis present

## 2016-02-17 DIAGNOSIS — M8588 Other specified disorders of bone density and structure, other site: Secondary | ICD-10-CM | POA: Diagnosis present

## 2016-02-17 DIAGNOSIS — S301XXA Contusion of abdominal wall, initial encounter: Secondary | ICD-10-CM | POA: Diagnosis not present

## 2016-02-17 DIAGNOSIS — I739 Peripheral vascular disease, unspecified: Secondary | ICD-10-CM | POA: Diagnosis present

## 2016-02-17 DIAGNOSIS — E860 Dehydration: Secondary | ICD-10-CM | POA: Diagnosis present

## 2016-02-17 DIAGNOSIS — R52 Pain, unspecified: Secondary | ICD-10-CM

## 2016-02-17 DIAGNOSIS — I4891 Unspecified atrial fibrillation: Secondary | ICD-10-CM | POA: Diagnosis present

## 2016-02-17 DIAGNOSIS — F419 Anxiety disorder, unspecified: Secondary | ICD-10-CM | POA: Diagnosis present

## 2016-02-17 DIAGNOSIS — R1310 Dysphagia, unspecified: Secondary | ICD-10-CM | POA: Diagnosis not present

## 2016-02-17 DIAGNOSIS — Z800 Family history of malignant neoplasm of digestive organs: Secondary | ICD-10-CM

## 2016-02-17 DIAGNOSIS — E039 Hypothyroidism, unspecified: Secondary | ICD-10-CM | POA: Diagnosis not present

## 2016-02-17 DIAGNOSIS — Z923 Personal history of irradiation: Secondary | ICD-10-CM

## 2016-02-17 DIAGNOSIS — Z96651 Presence of right artificial knee joint: Secondary | ICD-10-CM | POA: Diagnosis present

## 2016-02-17 DIAGNOSIS — E785 Hyperlipidemia, unspecified: Secondary | ICD-10-CM | POA: Diagnosis not present

## 2016-02-17 DIAGNOSIS — I77810 Thoracic aortic ectasia: Secondary | ICD-10-CM | POA: Diagnosis not present

## 2016-02-17 DIAGNOSIS — K560 Paralytic ileus: Secondary | ICD-10-CM | POA: Diagnosis not present

## 2016-02-17 DIAGNOSIS — Y838 Other surgical procedures as the cause of abnormal reaction of the patient, or of later complication, without mention of misadventure at the time of the procedure: Secondary | ICD-10-CM | POA: Diagnosis not present

## 2016-02-17 DIAGNOSIS — C169 Malignant neoplasm of stomach, unspecified: Secondary | ICD-10-CM | POA: Diagnosis not present

## 2016-02-17 DIAGNOSIS — Z66 Do not resuscitate: Secondary | ICD-10-CM | POA: Diagnosis not present

## 2016-02-17 DIAGNOSIS — Z888 Allergy status to other drugs, medicaments and biological substances status: Secondary | ICD-10-CM

## 2016-02-17 DIAGNOSIS — E876 Hypokalemia: Secondary | ICD-10-CM | POA: Diagnosis present

## 2016-02-17 DIAGNOSIS — Z9049 Acquired absence of other specified parts of digestive tract: Secondary | ICD-10-CM

## 2016-02-17 DIAGNOSIS — Z8673 Personal history of transient ischemic attack (TIA), and cerebral infarction without residual deficits: Secondary | ICD-10-CM

## 2016-02-17 DIAGNOSIS — I482 Chronic atrial fibrillation: Secondary | ICD-10-CM | POA: Diagnosis not present

## 2016-02-17 DIAGNOSIS — Z452 Encounter for adjustment and management of vascular access device: Secondary | ICD-10-CM | POA: Diagnosis not present

## 2016-02-17 DIAGNOSIS — Z789 Other specified health status: Secondary | ICD-10-CM | POA: Diagnosis present

## 2016-02-17 DIAGNOSIS — R1110 Vomiting, unspecified: Secondary | ICD-10-CM

## 2016-02-17 DIAGNOSIS — Z903 Acquired absence of stomach [part of]: Secondary | ICD-10-CM | POA: Diagnosis not present

## 2016-02-17 DIAGNOSIS — I480 Paroxysmal atrial fibrillation: Secondary | ICD-10-CM | POA: Diagnosis present

## 2016-02-17 DIAGNOSIS — G64 Other disorders of peripheral nervous system: Secondary | ICD-10-CM

## 2016-02-17 DIAGNOSIS — E038 Other specified hypothyroidism: Secondary | ICD-10-CM | POA: Diagnosis not present

## 2016-02-17 DIAGNOSIS — N183 Chronic kidney disease, stage 3 (moderate): Secondary | ICD-10-CM | POA: Diagnosis present

## 2016-02-17 DIAGNOSIS — Z483 Aftercare following surgery for neoplasm: Secondary | ICD-10-CM | POA: Diagnosis not present

## 2016-02-17 DIAGNOSIS — Z7901 Long term (current) use of anticoagulants: Secondary | ICD-10-CM | POA: Diagnosis not present

## 2016-02-17 DIAGNOSIS — G629 Polyneuropathy, unspecified: Secondary | ICD-10-CM | POA: Diagnosis present

## 2016-02-17 DIAGNOSIS — B370 Candidal stomatitis: Secondary | ICD-10-CM | POA: Diagnosis present

## 2016-02-17 DIAGNOSIS — I10 Essential (primary) hypertension: Secondary | ICD-10-CM | POA: Diagnosis present

## 2016-02-17 DIAGNOSIS — I129 Hypertensive chronic kidney disease with stage 1 through stage 4 chronic kidney disease, or unspecified chronic kidney disease: Secondary | ICD-10-CM | POA: Diagnosis present

## 2016-02-17 DIAGNOSIS — K2100 Gastro-esophageal reflux disease with esophagitis, without bleeding: Secondary | ICD-10-CM | POA: Diagnosis present

## 2016-02-17 DIAGNOSIS — Z6827 Body mass index (BMI) 27.0-27.9, adult: Secondary | ICD-10-CM

## 2016-02-17 DIAGNOSIS — Z5329 Procedure and treatment not carried out because of patient's decision for other reasons: Secondary | ICD-10-CM | POA: Diagnosis present

## 2016-02-17 DIAGNOSIS — K529 Noninfective gastroenteritis and colitis, unspecified: Secondary | ICD-10-CM | POA: Diagnosis not present

## 2016-02-17 DIAGNOSIS — R112 Nausea with vomiting, unspecified: Secondary | ICD-10-CM

## 2016-02-17 DIAGNOSIS — Z950 Presence of cardiac pacemaker: Secondary | ICD-10-CM

## 2016-02-17 DIAGNOSIS — Z8542 Personal history of malignant neoplasm of other parts of uterus: Secondary | ICD-10-CM

## 2016-02-17 DIAGNOSIS — Z96641 Presence of right artificial hip joint: Secondary | ICD-10-CM | POA: Diagnosis present

## 2016-02-17 DIAGNOSIS — Z8249 Family history of ischemic heart disease and other diseases of the circulatory system: Secondary | ICD-10-CM

## 2016-02-17 DIAGNOSIS — Z885 Allergy status to narcotic agent status: Secondary | ICD-10-CM

## 2016-02-17 DIAGNOSIS — R531 Weakness: Secondary | ICD-10-CM | POA: Diagnosis not present

## 2016-02-17 DIAGNOSIS — D62 Acute posthemorrhagic anemia: Secondary | ICD-10-CM | POA: Diagnosis not present

## 2016-02-17 DIAGNOSIS — Z905 Acquired absence of kidney: Secondary | ICD-10-CM

## 2016-02-17 DIAGNOSIS — Z79899 Other long term (current) drug therapy: Secondary | ICD-10-CM

## 2016-02-17 DIAGNOSIS — H548 Legal blindness, as defined in USA: Secondary | ICD-10-CM | POA: Diagnosis present

## 2016-02-17 DIAGNOSIS — Z85528 Personal history of other malignant neoplasm of kidney: Secondary | ICD-10-CM

## 2016-02-17 DIAGNOSIS — R627 Adult failure to thrive: Secondary | ICD-10-CM | POA: Diagnosis present

## 2016-02-17 DIAGNOSIS — Z90710 Acquired absence of both cervix and uterus: Secondary | ICD-10-CM

## 2016-02-17 DIAGNOSIS — R109 Unspecified abdominal pain: Secondary | ICD-10-CM | POA: Diagnosis not present

## 2016-02-17 DIAGNOSIS — R404 Transient alteration of awareness: Secondary | ICD-10-CM | POA: Diagnosis not present

## 2016-02-17 HISTORY — DX: Other specified health status: Z78.9

## 2016-02-17 LAB — COMPREHENSIVE METABOLIC PANEL
ALK PHOS: 90 U/L (ref 38–126)
ALT: 21 U/L (ref 14–54)
ANION GAP: 8 (ref 5–15)
AST: 22 U/L (ref 15–41)
Albumin: 3.6 g/dL (ref 3.5–5.0)
BUN: 16 mg/dL (ref 6–20)
CALCIUM: 9.5 mg/dL (ref 8.9–10.3)
CO2: 23 mmol/L (ref 22–32)
CREATININE: 1.2 mg/dL — AB (ref 0.44–1.00)
Chloride: 110 mmol/L (ref 101–111)
GFR, EST AFRICAN AMERICAN: 48 mL/min — AB (ref >60)
GFR, EST NON AFRICAN AMERICAN: 42 mL/min — AB (ref >60)
Glucose, Bld: 103 mg/dL — ABNORMAL HIGH (ref 65–99)
Potassium: 4.7 mmol/L (ref 3.5–5.1)
Sodium: 141 mmol/L (ref 135–145)
Total Bilirubin: 0.9 mg/dL (ref 0.3–1.2)
Total Protein: 6.9 g/dL (ref 6.5–8.1)

## 2016-02-17 LAB — I-STAT CHEM 8, ED
BUN: 19 mg/dL (ref 6–20)
Calcium, Ion: 1.16 mmol/L (ref 1.13–1.30)
Chloride: 108 mmol/L (ref 101–111)
Creatinine, Ser: 1.2 mg/dL — ABNORMAL HIGH (ref 0.44–1.00)
Glucose, Bld: 103 mg/dL — ABNORMAL HIGH (ref 65–99)
HEMATOCRIT: 47 % — AB (ref 36.0–46.0)
HEMOGLOBIN: 16 g/dL — AB (ref 12.0–15.0)
Potassium: 4.7 mmol/L (ref 3.5–5.1)
SODIUM: 144 mmol/L (ref 135–145)
TCO2: 23 mmol/L (ref 0–100)

## 2016-02-17 LAB — CBC WITH DIFFERENTIAL/PLATELET
Basophils Absolute: 0 10*3/uL (ref 0.0–0.1)
Basophils Relative: 0 %
EOS PCT: 3 %
Eosinophils Absolute: 0.3 10*3/uL (ref 0.0–0.7)
HCT: 45.2 % (ref 36.0–46.0)
HEMOGLOBIN: 14.6 g/dL (ref 12.0–15.0)
LYMPHS ABS: 1.8 10*3/uL (ref 0.7–4.0)
LYMPHS PCT: 20 %
MCH: 30.2 pg (ref 26.0–34.0)
MCHC: 32.3 g/dL (ref 30.0–36.0)
MCV: 93.4 fL (ref 78.0–100.0)
MONOS PCT: 9 %
Monocytes Absolute: 0.8 10*3/uL (ref 0.1–1.0)
Neutro Abs: 6 10*3/uL (ref 1.7–7.7)
Neutrophils Relative %: 68 %
PLATELETS: 217 10*3/uL (ref 150–400)
RBC: 4.84 MIL/uL (ref 3.87–5.11)
RDW: 17 % — ABNORMAL HIGH (ref 11.5–15.5)
WBC: 9 10*3/uL (ref 4.0–10.5)

## 2016-02-17 LAB — LIPASE, BLOOD: Lipase: 48 U/L (ref 11–51)

## 2016-02-17 LAB — PROTIME-INR
INR: 3 — AB (ref 0.00–1.49)
PROTHROMBIN TIME: 30.6 s — AB (ref 11.6–15.2)

## 2016-02-17 LAB — I-STAT CG4 LACTIC ACID, ED
LACTIC ACID, VENOUS: 1.32 mmol/L (ref 0.5–2.0)
Lactic Acid, Venous: 1.36 mmol/L (ref 0.5–2.0)

## 2016-02-17 MED ORDER — WARFARIN SODIUM 2 MG PO TABS
1.0000 mg | ORAL_TABLET | Freq: Once | ORAL | Status: AC
  Filled 2016-02-17: qty 1

## 2016-02-17 MED ORDER — FAMOTIDINE IN NACL 20-0.9 MG/50ML-% IV SOLN
20.0000 mg | Freq: Two times a day (BID) | INTRAVENOUS | Status: AC
  Administered 2016-02-17 – 2016-02-18 (×2): 20 mg via INTRAVENOUS
  Filled 2016-02-17 (×4): qty 50

## 2016-02-17 MED ORDER — FUROSEMIDE 40 MG PO TABS: 40.0000 mg | ORAL_TABLET | Freq: Every day | ORAL | Status: DC

## 2016-02-17 MED ORDER — ACETAMINOPHEN 650 MG RE SUPP
650.0000 mg | Freq: Four times a day (QID) | RECTAL | Status: DC | PRN
  Administered 2016-02-23 – 2016-03-06 (×12): 650 mg via RECTAL
  Filled 2016-02-17 (×14): qty 1

## 2016-02-17 MED ORDER — WARFARIN - PHARMACIST DOSING INPATIENT
Freq: Every day | Status: DC
  Administered 2016-02-19: 18:00:00

## 2016-02-17 MED ORDER — ALUM & MAG HYDROXIDE-SIMETH 200-200-20 MG/5ML PO SUSP
30.0000 mL | Freq: Four times a day (QID) | ORAL | Status: DC | PRN
  Administered 2016-02-29 – 2016-03-15 (×2): 30 mL via ORAL
  Filled 2016-02-17 (×5): qty 30

## 2016-02-17 MED ORDER — ONDANSETRON HCL 4 MG PO TABS: 4.0000 mg | ORAL_TABLET | Freq: Four times a day (QID) | ORAL | Status: DC | PRN

## 2016-02-17 MED ORDER — POTASSIUM CHLORIDE CRYS ER 20 MEQ PO TBCR: 20.0000 meq | EXTENDED_RELEASE_TABLET | Freq: Two times a day (BID) | ORAL | Status: DC

## 2016-02-17 MED ORDER — SODIUM CHLORIDE 0.9 % IV BOLUS (SEPSIS)
500.0000 mL | Freq: Once | INTRAVENOUS | Status: AC
  Administered 2016-02-17: 500 mL via INTRAVENOUS

## 2016-02-17 MED ORDER — LORAZEPAM 2 MG/ML IJ SOLN
0.5000 mg | Freq: Four times a day (QID) | INTRAMUSCULAR | Status: DC | PRN
  Administered 2016-03-07: 0.5 mg via INTRAVENOUS
  Filled 2016-02-17 (×2): qty 1

## 2016-02-17 MED ORDER — ACETAMINOPHEN 325 MG PO TABS: 650.0000 mg | ORAL_TABLET | Freq: Four times a day (QID) | ORAL | Status: DC | PRN

## 2016-02-17 MED ORDER — ONDANSETRON HCL 4 MG/2ML IJ SOLN
4.0000 mg | Freq: Four times a day (QID) | INTRAMUSCULAR | Status: DC | PRN
  Administered 2016-02-21 – 2016-03-08 (×32): 4 mg via INTRAVENOUS
  Filled 2016-02-17 (×33): qty 2

## 2016-02-17 MED ORDER — ONDANSETRON HCL 4 MG/2ML IJ SOLN
4.0000 mg | Freq: Once | INTRAMUSCULAR | Status: AC
  Administered 2016-02-17: 4 mg via INTRAVENOUS
  Filled 2016-02-17: qty 2

## 2016-02-17 MED ORDER — DILTIAZEM HCL 25 MG/5ML IV SOLN
10.0000 mg | Freq: Four times a day (QID) | INTRAVENOUS | Status: DC | PRN
  Administered 2016-02-17: 10 mg via INTRAVENOUS
  Filled 2016-02-17 (×2): qty 5

## 2016-02-17 MED ORDER — DEXTROSE-NACL 5-0.45 % IV SOLN
INTRAVENOUS | Status: AC
Start: 2016-02-17 — End: 2016-02-18
  Administered 2016-02-17 – 2016-02-18 (×2): via INTRAVENOUS

## 2016-02-17 MED ORDER — LEVOTHYROXINE SODIUM 100 MCG PO TABS
100.0000 ug | ORAL_TABLET | Freq: Every day | ORAL | Status: DC
  Filled 2016-02-17: qty 1

## 2016-02-17 MED ORDER — METHOCARBAMOL 1000 MG/10ML IJ SOLN
500.0000 mg | Freq: Three times a day (TID) | INTRAVENOUS | Status: DC | PRN
  Administered 2016-02-19 – 2016-02-22 (×2): 500 mg via INTRAVENOUS
  Filled 2016-02-17 (×2): qty 5

## 2016-02-17 MED ORDER — HYDRALAZINE HCL 20 MG/ML IJ SOLN: 5.0000 mg | INTRAMUSCULAR | Status: DC | PRN

## 2016-02-17 MED ORDER — IOPAMIDOL (ISOVUE-300) INJECTION 61%
INTRAVENOUS | Status: AC
  Administered 2016-02-17: 75 mL
  Filled 2016-02-17: qty 100

## 2016-02-17 MED ORDER — SERTRALINE HCL 50 MG PO TABS: 50.0000 mg | ORAL_TABLET | Freq: Every day | ORAL | Status: DC

## 2016-02-17 MED ORDER — TRAMADOL HCL 50 MG PO TABS
50.0000 mg | ORAL_TABLET | Freq: Four times a day (QID) | ORAL | Status: DC | PRN
  Administered 2016-02-21: 50 mg via ORAL
  Filled 2016-02-17: qty 1

## 2016-02-17 MED ORDER — DILTIAZEM HCL ER COATED BEADS 180 MG PO CP24
180.0000 mg | ORAL_CAPSULE | Freq: Every day | ORAL | Status: DC
  Filled 2016-02-17: qty 1

## 2016-02-17 MED ORDER — PREGABALIN 75 MG PO CAPS: 75.0000 mg | ORAL_CAPSULE | Freq: Two times a day (BID) | ORAL | Status: DC

## 2016-02-17 MED ORDER — SODIUM CHLORIDE 0.9 % IV SOLN
250.0000 mg | Freq: Two times a day (BID) | INTRAVENOUS | Status: DC
  Administered 2016-02-17 – 2016-02-20 (×5): 250 mg via INTRAVENOUS
  Filled 2016-02-17 (×9): qty 5

## 2016-02-17 MED ORDER — METOPROLOL TARTRATE 100 MG PO TABS: 100.0000 mg | ORAL_TABLET | Freq: Two times a day (BID) | ORAL | Status: DC

## 2016-02-17 MED ORDER — FENTANYL CITRATE (PF) 100 MCG/2ML IJ SOLN
25.0000 ug | INTRAMUSCULAR | Status: DC | PRN
  Administered 2016-02-18 – 2016-02-22 (×4): 25 ug via INTRAVENOUS
  Filled 2016-02-17 (×4): qty 2

## 2016-02-17 MED ORDER — ALUM & MAG HYDROXIDE-SIMETH 200-200-20 MG/5ML PO SUSP
30.0000 mL | Freq: Once | ORAL | Status: AC
  Administered 2016-02-17: 30 mL via ORAL
  Filled 2016-02-17: qty 30

## 2016-02-17 MED ORDER — METHOCARBAMOL 500 MG PO TABS
500.0000 mg | ORAL_TABLET | Freq: Three times a day (TID) | ORAL | Status: DC | PRN
Start: 2016-02-17 — End: 2016-02-17

## 2016-02-17 MED ORDER — ERYTHROMYCIN LACTOBIONATE 500 MG IV SOLR: 250.0000 mg | Freq: Two times a day (BID) | INTRAVENOUS | Status: DC

## 2016-02-17 NOTE — Progress Notes (Signed)
[facility]  Patient Status:  Active pt with [facility] prior to this ED visit/admission  [facility] is providing the following services: HHRN with RD consult for nutritional follow up once TPN stopped upon DC to home. [facility] hospital team will follow pt while at [facility] to support transition home and any new orders.  If patient discharges after hours, please call [phone].   [name] 02/17/2016, 3:18 PM

## 2016-02-17 NOTE — Progress Notes (Addendum)
ANTICOAGULATION CONSULT NOTE - Initial Consult  Pharmacy Consult for warfarin Indication: atrial fibrillation  Allergies  Allergen Reactions  . Amiodarone Other (See Comments)     Thyroid and liver and kidney problems  . Codeine Nausea And Vomiting  . Gabapentin Itching  . Hydrocodone Other (See Comments)    hallucination  . Morphine And Related Nausea And Vomiting  . Requip [Ropinirole Hcl]     Headache   . Zinc Nausea Only    nausea  . Reglan [Metoclopramide] Hives, Itching and Rash    Made face red, also    Vital Signs: Temp: 97.2 F (36.2 C) (06/13 1440) Temp Source: Oral (06/13 1440) BP: 123/80 mmHg (06/13 1440) Pulse Rate: 122 (06/13 1440)   Recent Labs  02/17/16 1509 02/17/16 1519  HGB 14.6 16.0*  HCT 45.2 47.0*  PLT 217  --   LABPROT 30.6*  --   INR 3.00*  --   CREATININE 1.20* 1.20*    Estimated Creatinine Clearance: 31.3 mL/min (by C-G formula based on Cr of 1.2).   Medical History: Past Medical History  Diagnosis Date  . PAF (paroxysmal atrial fibrillation) ([place])   . CAD (coronary artery disease)   . Aortic root dilatation ([place])   . Moderate aortic stenosis   . Tachycardia-bradycardia syndrome ([place])     s/p PPM by Dr Doreatha Lew (MDT) 07/03/10  . GERD (gastroesophageal reflux disease)   . Neuropathy ([place])     feet/legs  . Restless leg syndrome   . History of uterine cancer 1980s    treated with hysterectomy, external radiation and radiation seed implants.   . Cancer ([place])     hx Kidney cancer / hx of endometrial cancer  . Anxiety   . HTN (hypertension)   . Hyperlipemia   . Hypothyroidism   . Osteomyelitis ([place]) as child  . Elevated LFTs     hepatic steatosis  . Gastroparesis   . Anal fissure   . Radiation proctitis   . Neuromuscular disorder (HCC)     neuropathy in right leg  . Adenocarcinoma of stomach ([place]) 11/11/2015    gastric mass on egd  . CHF (congestive heart failure) ([place])   . Chronic kidney disease     only has one kidney  rt lft rem 03 ca  . 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 Anemia     hx 3/17 iron infusion, on aranesp and injected B12 since 11/2015.   . Stroke ([place]) 15    partial stroke left legally blind    Medications:  Scheduled:  . [START ON 02/18/2016] diltiazem  180 mg Oral Daily  . [START ON 02/18/2016] furosemide  40 mg Oral Daily  . [START ON 02/18/2016] levothyroxine  100 mcg Oral QAC breakfast  . metoprolol  100 mg Oral BID  . [START ON 02/18/2016] potassium chloride SA  20 mEq Oral BID  . pregabalin  75 mg Oral BID  . sertraline  50 mg Oral Daily  . warfarin  1 mg Oral ONCE-1800  . Warfarin - Pharmacist Dosing Inpatient   Does not apply q1800    Assessment: 82 yo with history of Afib. Pharmacy consulted to dose warfarin. INR on admission 3.00 at the upper range of therapeutic. PTA patient takes 2mg  daily. Took last dose yesterday evening. Will decrease dose today and follow up AM INR for further adjustment.  Goal of Therapy:  INR 2-3 Monitor platelets by anticoagulation protocol: Yes   Plan:  Warfarin 1mg *1 tonight Daily INR Follow for  signs or symptoms of bleeding  Melburn Popper, PharmD Clinical Pharmacy Resident Pager: [phone] 02/17/2016 5:25 PM

## 2016-02-17 NOTE — ED Provider Notes (Signed)
Pt presents to the ED for recurrent vomiting.  She has a history of partial gastrectomy last month for adenocarcinoma.  Pt has had problems with reflux and recurrent vomiting.  She was in the hospital earlier this month and recently released.  Pt started having recurrent dry heaves and vomiting last night.  She has not been able to keep down her medications.  She is denying any abdominal pain. Physical Exam  BP 123/80 mmHg  Pulse 122  Temp(Src) 97.2 F (36.2 C) (Oral)  Resp 18  SpO2 96%  LMP  (Exact Date)  Physical Exam  Constitutional: No distress.  HENT:  Head: Normocephalic and atraumatic.  Right Ear: External ear normal.  Left Ear: External ear normal.  Eyes: Conjunctivae are normal. Right eye exhibits no discharge. Left eye exhibits no discharge. No scleral icterus.  Neck: Neck supple. No tracheal deviation present.  Cardiovascular: An irregularly irregular rhythm present. Tachycardia present.   Pulmonary/Chest: Effort normal. No stridor. No respiratory distress.  Abdominal: She exhibits no ascites and no mass. There is no tenderness. There is no rigidity and no guarding. No hernia.  Musculoskeletal: She exhibits no edema.  Neurological: She is alert. Cranial nerve deficit: no gross deficits.  Skin: Skin is warm and dry. No rash noted. She is not diaphoretic.  Psychiatric: She has a normal mood and affect.  Nursing note and vitals reviewed.   ED Course  Procedures  EKG Interpretation  Date/Time:  Tuesday February 17 2016 14:49:14 EDT Ventricular Rate:  122 PR Interval:    QRS Duration: 83 QT Interval:  330 QTC Calculation: 470 R Axis:   32 Text Interpretation:  Atrial fibrillation Repol abnrm suggests ischemia, diffuse leads History of atrial fibrillation with depressions in inferolateral leads on 01/24/2016 Confirmed by LIU MD, DANA [phone]) on 02/17/2016 2:53:48 PM       MDM Will treat with iv hydration, antiemetics.  Consider CT scan to evaluate for obstruction. Monitor  her heart rate.  See if it responds to fluid hydration. EKG is similar to prior EKG on file then end of may. Pt would prefer to be able to go home but she will need to be able to tolerate oral fluids if her evaluation is reassuring.       Dorie Rank, MD 02/17/16 1536

## 2016-02-17 NOTE — ED Notes (Signed)
Pt vomited approx 15 seconds after consuming oral med. MD aware

## 2016-02-17 NOTE — H&P (Signed)
History and Physical    GENISIS SONNIER SWF:[id number] DOB: Jun 08, 1934 DOA: 02/17/2016  PCP: [name], MD  Patient coming from:    home    Chief Complaint: vomiting  HPI: [name] is a 82 y.o. female with medical history significant for but not  limited to, atrial fibrillation, CKD, HTN, kidney cancer and gastric cancer. She is status post proximal gastrectomy early May for gastric adenocarcinoma. She was discharged home late May on twice daily PPI and Carafate. She was tolerating soft diet.  Patient was readmitted several days later for nausea and vomiting.  During that admission she underwent upper endoscopy with findings of grade D esophagitis and 2 small gastric ulcers at the anastomosis. She was given TNA during that admission.. Patient was discharged home 02/13/16 on twice a day PPI and metoclopramide but was unable to tolerate metoclopramide secondary to hives so instead took Maalox. Patient was compliant at home with twice daily PPI and antireflux measures but this am patient began vomiting again. She had been  "spitting up" but that turned to vomiting this am. She has had burning discomfort in her throat and chest since last admission. No abdominal pain. Stool chronically loose and unchanged.     ED Course:   HR 122, labs IV Zofran Contrast CT scan the abdomen and pelvis-no acute findings just expected postop changes of distal esophagus  Review of Systems:   Past Medical History  Diagnosis Date  . PAF (paroxysmal atrial fibrillation) ([place])   . CAD (coronary artery disease)   . Aortic root dilatation ([place])   . Moderate aortic stenosis   . Tachycardia-bradycardia syndrome ([place])     s/p PPM by Dr Doreatha Lew (MDT) 07/03/10  . GERD (gastroesophageal reflux disease)   . Neuropathy ([place])     feet/legs  . Restless leg syndrome   . History of uterine cancer 1980s    treated with hysterectomy, external radiation and radiation seed implants.   . Cancer ([place])     hx  Kidney cancer / hx of endometrial cancer  . Anxiety   . HTN (hypertension)   . Hyperlipemia   . Hypothyroidism   . Osteomyelitis ([place]) as child  . Elevated LFTs     hepatic steatosis  . Gastroparesis   . Anal fissure   . Radiation proctitis   . Neuromuscular disorder (HCC)     neuropathy in right leg  . Adenocarcinoma of stomach ([place]) 11/11/2015    gastric mass on egd  . CHF (congestive heart failure) ([place])   . Chronic kidney disease     only has one kidney rt lft rem 03 ca  . 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 Anemia     hx 3/17 iron infusion, on aranesp and injected B12 since 11/2015.   . Stroke ([place]) 15    partial stroke left legally blind    Past Surgical History  Procedure Laterality Date  . Cardiac catheterization  2008  . Nephrectomy Left     renal cell cancer  . Patent ductus arterious repair  ~ 1949  . Cholecystectomy  1970s  . Upper gastrointestinal endoscopy    . Total knee arthroplasty  02/07/2012    Procedure: TOTAL KNEE ARTHROPLASTY;  Surgeon: Mauri Pole, MD;  Location: [facility];  Service: Orthopedics;  Laterality: Right;  . Total hip revision Right 2009    initial replacment surg  . I&d knee with poly exchange Right 12/17/2013    Procedure: IRRIGATION AND DEBRIDEMEN RIGHT TOTAL  KNEE WITH POLY EXCHANGE;  Surgeon: Mauri Pole, MD;  Location: [facility];  Service: Orthopedics;  Laterality: Right;  . Patellar tendon repair Right 03/06/2014    Procedure: AVULSION WITH PATELLA TENDON REPAIR;  Surgeon: Mauri Pole, MD;  Location: [facility];  Service: Orthopedics;  Laterality: Right;  . Total knee revision Right 07/29/2014    Procedure: RIGHT KNEE REVISION OF PREVIOUS REPAIR EXTENSOR MECHANISM;  Surgeon: Mauri Pole, MD;  Location: [facility];  Service: Orthopedics;  Laterality: Right;  . Colonoscopy N/A 11/11/2015    Procedure: COLONOSCOPY;  Surgeon: [name], MD;  Location: [facility];  Service: Endoscopy;  Laterality: N/A;  . Esophagogastroduodenoscopy N/A 11/11/2015     Procedure: ESOPHAGOGASTRODUODENOSCOPY (EGD);  Surgeon: [name], MD;  Location: [facility];  Service: Endoscopy;  Laterality: N/A;  . Total abdominal hysterectomy  87  . Laparoscopy N/A 01/15/2016    Procedure: LAPAROSCOPY DIAGNOSTIC;  Surgeon: Stark Klein, MD;  Location: [facility];  Service: General;  Laterality: N/A;  . Gastrectomy N/A 01/15/2016    Procedure: OPEN PARTIAL GASTRECTOMY;  Surgeon: Stark Klein, MD;  Location: [facility];  Service: General;  Laterality: N/A;  . Pacemaker insertion  07/04/11    MDT PPM implanted by Dr Doreatha Lew for tachy/brady  . Esophagogastroduodenoscopy N/A 02/05/2016    Procedure: ESOPHAGOGASTRODUODENOSCOPY (EGD);  Surgeon: Ladene Artist, MD;  Location: [facility];  Service: Endoscopy;  Laterality: N/A;    Social History   Social History  . Marital Status: Widowed    Spouse Name: N/A  . Number of Children: N/A  . Years of Education: N/A   Occupational History  . Not on file.   Social History Main Topics  . Smoking status: Never Smoker   . Smokeless tobacco: Never Used  . Alcohol Use: No  . Drug Use: No  . Sexual Activity: No   Other Topics Concern  . Not on file   Social History Narrative   Lives in [place].  Continues to work.    Allergies  Allergen Reactions  . Amiodarone Other (See Comments)     Thyroid and liver and kidney problems  . Codeine Nausea And Vomiting  . Gabapentin Itching  . Hydrocodone Other (See Comments)    hallucination  . Morphine And Related Nausea And Vomiting  . Requip [Ropinirole Hcl]     Headache   . Zinc Nausea Only    nausea  . Reglan [Metoclopramide] Hives, Itching and Rash    Made face red, also    Family History  Problem Relation Age of Onset  . Colon cancer Mother 60  . Heart disease Mother   . Heart disease Father   . Heart attack Father   . Heart disease Maternal Grandmother     Prior to Admission medications   Medication Sig Start Date End Date Taking? Authorizing Provider    acetaminophen (TYLENOL) 325 MG tablet Take 1 tablet (325 mg total) by mouth every 6 (six) hours as needed for mild pain, moderate pain, fever or headache. 02/13/16  Yes Mauricio Gerome Apley, MD  ALPRAZolam Duanne Moron) 0.5 MG tablet TAKE 1/2-1 TABLET BY MOUTH 3 TIMES A DAY AS NEEDED FOR ANXIETY 01/01/16  Yes Courtney Forcucci, PA-C  B Complex-C (B-COMPLEX WITH VITAMIN C) tablet Take 1 tablet by mouth daily. 12/11/15  Yes [name], MD  Biotin 5000 MCG CAPS Take 5,000 mcg by mouth every morning.    Yes Historical Provider, MD  Cholecalciferol (VITAMIN D PO) Take 5,000 Units  by mouth every morning.    Yes Historical Provider, MD  Cyanocobalamin 1000 MCG/ML KIT Inject 1,000 mcg into the skin as directed. Patient taking differently: Inject 1,000 mcg into the skin every 30 (thirty) days.  11/24/15  Yes Brunetta Genera, MD  diltiazem (CARDIZEM CD) 180 MG 24 hr capsule Take 1 capsule (180 mg total) by mouth daily. 02/13/16  Yes Mauricio Gerome Apley, MD  furosemide (LASIX) 40 MG tablet TAKE 1 TABLET (40 MG TOTAL) BY MOUTH DAILY. 02/07/16  Yes [name], MD  levothyroxine (SYNTHROID, LEVOTHROID) 100 MCG tablet TAKE 1/2 TABLET (50 MCG TOTAL) BY MOUTH EVERY DAY ON EMPTY STOMACH WITH A FULL GLASS OF WATER 02/13/16  Yes Mauricio Gerome Apley, MD  LYRICA 75 MG capsule TAKE ONE CAPSULE BY MOUTH TWICE A DAY 11/29/15  Yes [name], MD  methocarbamol (ROBAXIN) 500 MG tablet Take 1 tablet (500 mg total) by mouth every 8 (eight) hours as needed for muscle spasms. 01/26/16  Yes Stark Klein, MD  metoprolol (LOPRESSOR) 100 MG tablet Take 1 tablet (100 mg total) by mouth 2 (two) times daily. 02/13/16  Yes Mauricio Gerome Apley, MD  Multiple Vitamin (MULTIVITAMIN) tablet Take 1 tablet by mouth daily.   Yes Historical Provider, MD  nitroGLYCERIN (NITROSTAT) 0.4 MG SL tablet Place 1 tablet (0.4 mg total) under the tongue every 5 (five) minutes as needed for chest pain (MAX 3 TABLETS). 05/19/15  Yes Darlin Coco, MD   ondansetron (ZOFRAN-ODT) 4 MG disintegrating tablet Take 1 tablet (4 mg total) by mouth every 6 (six) hours as needed for nausea. 01/26/16  Yes Stark Klein, MD  pantoprazole (PROTONIX) 40 MG tablet TAKE 1 TABLET (40 MG TOTAL) BY MOUTH 2 (TWO) TIMES DAILY. 12/11/15  Yes [name], MD  potassium chloride SA (K-DUR,KLOR-CON) 20 MEQ tablet Take 20 mEq by mouth 2 (two) times daily.   Yes Historical Provider, MD  sertraline (ZOLOFT) 50 MG tablet Take 1 tablet (50 mg total) by mouth daily. For mood 09/23/15  Yes [name], MD  traMADol (ULTRAM) 50 MG tablet Take 1 tablet (50 mg total) by mouth every 6 (six) hours as needed (pain). Reported on 10/14/2015 01/26/16  Yes Stark Klein, MD  warfarin (COUMADIN) 2 MG tablet TAKE 1 TABLET DAILY OR AS DIRECTED Patient taking differently: Take 2 mg by mouth nightly (Sun/Mon/Tues/Wed/Thurs/Fri/Sat) 01/26/16  Yes [name], MD  cetirizine (ZYRTEC) 10 MG tablet Take 10 mg by mouth at bedtime.    Historical Provider, MD    Physical Exam: Filed Vitals:   02/17/16 1440  BP: 123/80  Pulse: 122  Temp: 97.2 F (36.2 C)  TempSrc: Oral  Resp: 18  SpO2: 96%    Constitutional:  NAD, calm, comfortable Filed Vitals:   02/17/16 1440  BP: 123/80  Pulse: 122  Temp: 97.2 F (36.2 C)  TempSrc: Oral  Resp: 18  SpO2: 96%   Eyes: PER, lids and conjunctivae normal ENMT: Mucous membranes are moist. Posterior pharynx clear of any exudate or lesions. Neck: normal, supple, no masses Respiratory: clear to auscultation bilaterally, no wheezing, no crackles. Normal respiratory effort. No accessory muscle use.  Cardiovascular: Irregular rate and rhythm.  No extremity edema. 2+ pedal pulses.  Abdomen: no tenderness, no masses palpated. No hepatomegaly. Bowel sounds positive.  Musculoskeletal: no clubbing / cyanosis. Right knee fixed position Normal muscle tone.  Skin: no rashes, lesions, ulcers.  Neurologic: CN 2-12 grossly intact. Sensation intact, Strength 5/5  in all 4.  Psychiatric: Normal judgment and insight. Alert  and oriented x 3. Normal mood.    Labs on Admission: I have personally reviewed following labs and imaging studies   Radiological Exams on Admission: Ct Abdomen Pelvis W Contrast  02/17/2016  CLINICAL DATA:  Pt. Presents with complaint of abd pain and esophageal pain. Per pt. She has had stomach CA with recent surgery last month. Pt. States no oncology follow up, surgery only. Pt. Reports decreased appetite, N/V starting this AM around 0930. Pt. Had 3 episodes of emesis this AM. Pt. Has hx of afib, taking coumadin, h/o left renal ca, h/o nephrectomy, gastrectomy EXAM: CT ABDOMEN AND PELVIS WITH CONTRAST TECHNIQUE: Multidetector CT imaging of the abdomen and pelvis was performed using the standard protocol following bolus administration of intravenous contrast. CONTRAST:  6m ISOVUE-300 IOPAMIDOL (ISOVUE-300) INJECTION 61% COMPARISON:  11/12/2015. FINDINGS: Lung bases: Minor scarring or atelectasis the left lower lobe. Minor scarring in adjacent left upper lobe lingula. No evidence of pneumonia edema. Heart is top-normal in size. Hepatobiliary: Unremarkable liver. Gallbladder surgically absent. No significant bile duct dilation. Spleen, pancreas, adrenal glands:  Unremarkable. Kidneys, ureters, bladder: Left kidney is surgically absent. Normal right kidney. No hydronephrosis. Normal right ureter. Bladder is unremarkable. Uterus and adnexa:  Uterus surgically absent.  No pelvic masses. Vascular: Set densely calcified atherosclerotic plaque noted along the abdominal aorta and its branch vessels. No aneurysm. Lymph nodes:  No enlarged lymph nodes. Ascites:  None. Gastrointestinal: There is some thickening of the wall of the distal esophagus and the adjacent stomach at the level of the anastomosis. There is no convincing mass. Stomach is mostly decompressed. Small bowel and colon are unremarkable. Abdominal wall:  Midline scar.  No hernia.  Musculoskeletal: Well-positioned right hip total arthroplasty. Degenerative changes of the visualized spine. No osteoblastic or osteolytic lesions. IMPRESSION: 1. There is some apparent wall thickening of the distal esophagus and adjacent stomach at the gastroesophageal anastomosis. This may reflect inflammation. There is no convincing stomach or esophageal mass and no evidence of metastatic disease. 2. No other findings to account this patient's symptoms. 3. Status post cholecystectomy, left nephrectomy and hysterectomy and right hip total arthroplasty. Electronically Signed   By: DLajean ManesM.D.   On: 02/17/2016 16:10    EKG: Independently reviewed.   EKG Interpretation  Date/Time:  Tuesday February 17 2016 14:49:14 EDT Ventricular Rate:  122 PR Interval:    QRS Duration: 83 QT Interval:  330 QTC Calculation: 470 R Axis:   32 Text Interpretation:  Atrial fibrillation Repol abnrm suggests ischemia, diffuse leads History of atrial fibrillation with depressions in inferolateral leads on 01/24/2016 Confirmed by LIU MD, DANA [phone] on 02/17/2016 2:53:48 PM       Assessment/Plan   Principal Problem:   Intractable nausea and vomiting Active Problems:   Hypothyroidism   Atrial fibrillation (HCC)   Cardiac pacemaker (06/2010)   Essential hypertension   Peripheral nerve disease (HCC)   Adenocarcinoma of stomach (HCC)   Dehydration   Gastroesophageal reflux disease with esophagitis   AKI (acute kidney injury) ([place]   S/P partial gastrectomy      Recurrent nausea and vomiting / dehydration . Patient had a subtotal gastrectomy early May for gastric cancer . Readmitted earlier this month with nausea and vomiting . Upper endoscopy 02/05/16 with findings of severe esophagitis and 2 nonbleeding small anastomotic ulcers. . CT scan today suggests wall thickening of the distal esophagus and adjacent stomach at the gastroesophageal anastomosised inflammation. No other findings, no evidence for  obstruction. Etiology of recurrent vomiting  unclear at this point.  -Place in observation-tele bed -IV fluids -Given national shortage of IV Protonix will give IV Pepcid twice daily -Anti-emetics -Cannot tolerate metoclopramide so will try erythromycin IV twice daily. This may worsen her chronic loose stool -Currently her albumin is normal at 3.6. Not taking PO at present but should get calorie count / nutritional consult prior to discharge. Sounds like overall her by mouth intake at home has been poor  Gastric adenocarcinoma, diagnosed in March. Patient is status post subtotal gastrectomy early May. No evidence for metastasis  GERD/ severe esophagitis on recent EGD -Twice a day IV H2 blocker (national shortage of IV PPI) -Anti-reflux measures. She does a nice job of following these at home  Paroxysmal Atrial fib. Chadsvasc 5. On warfarin , INR 3 . Presenting heart rate 122, suspect this was secondary to volume depletion related to vomiting. Heart rate now 90 -IV diltiazem -Monitor on telemetry -Coumadin per pharmacy, would hold today since patient is vomiting  Acute kidney injury. Baseline creatinine 0.7, creatinine 1.2 today. Suspect this will improve with hydration -Avoid nephrotoxic medications -am bmet  Peripheral nerve disease.  -Home ultram as tolerated -Fentanyl prn  Hypothyroidism. -Hopefully she will tolerate home Synthroid tomorrow  Tachycardia bradycardia syndrome, status post dual chamber pacemaker-  Hypertension, controlled.  -Hopefully she will be able to tolerate by mouth meds IV a.m.  History of kidney cancer, status post nephrectomy   DVT prophylaxis:   Anti-coagulated at home with warfarin Code Status:   DNR Family Communication: Spoke with daughters in the room. They have understand and agree with plan of treatment   Disposition Plan: Discharge home in 24-48 hours              Consults called: None  Admission status:  Observation - Medical bed  Tye Savoy NP [facility] Pager [phone]  If 7PM-7AM, please contact night-coverage [email or web address] Password [facility]  02/17/2016, 4:53 PM

## 2016-02-17 NOTE — ED Notes (Signed)
Pt actively vomiting yellow looking gastric content.

## 2016-02-17 NOTE — ED Provider Notes (Signed)
CSN: [id number]     Arrival date & time 02/17/16  1431 History   First MD Initiated Contact with Patient 02/17/16 1456     Chief Complaint  Patient presents with  . Emesis    HPI Comments: 82 year old female past history of paroxysmal A. fib on Coumadin, CAD, adenocarcinoma of the stomach status post open partial gastrectomy on 01/15/2016, hypertension, CVA presents to the ED with her 2 daughters due to nausea and vomiting. She was recently admitted to this hospital for esophagitis and was discharged 4 days ago. Since that time she has been "spitting", however last night she began having nausea and vomiting. She was unable to tolerate anything by mouth this morning, including her medications. Endorses "burning" of her "esophagus". She denies hematemesis, melena, abdominal pain. She is moving her bowels. She has chronic diarrhea that is unchanged.  Patient is a 82 y.o. female presenting with vomiting. The history is provided by the patient and a relative (2 daughters at bedside).  Emesis Severity:  Severe Duration: Since last night. Timing:  Constant Number of daily episodes:  TNTC Quality:  Stomach contents Feeding tolerance: Nothing. Progression:  Unchanged Chronicity:  New Relieved by:  None tried Worsened by:  Liquids Ineffective treatments:  None tried Associated symptoms: diarrhea (chronic, unchanged)   Associated symptoms: no abdominal pain, no chills, no cough, no fever and no URI   Risk factors: prior abdominal surgery (open partial gastrectomy 01/15/16)     Past Medical History  Diagnosis Date  . PAF (paroxysmal atrial fibrillation) ([place])   . CAD (coronary artery disease)   . Aortic root dilatation ([place])   . Moderate aortic stenosis   . Tachycardia-bradycardia syndrome ([place])     s/p PPM by Dr Doreatha Lew (MDT) 07/03/10  . GERD (gastroesophageal reflux disease)   . Neuropathy ([place])     feet/legs  . Restless leg syndrome   . History of uterine cancer 1980s    treated with  hysterectomy, external radiation and radiation seed implants.   . Cancer ([place])     hx Kidney cancer / hx of endometrial cancer  . Anxiety   . HTN (hypertension)   . Hyperlipemia   . Hypothyroidism   . Osteomyelitis ([place]) as child  . Elevated LFTs     hepatic steatosis  . Gastroparesis   . Anal fissure   . Radiation proctitis   . Neuromuscular disorder (H[facility])     neuropathy in right leg  . Adenocarcinoma of stomach ([place]) 11/11/2015    gastric mass on egd  . CHF (congestive heart failure) ([place])   . Chronic kidney disease     only has one kidney rt lft rem 03 ca  . 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 Anemia     hx 3/17 iron infusion, on aranesp and injected B12 since 11/2015.   . Stroke ([place]) 15    partial stroke left legally blind   Past Surgical History  Procedure Laterality Date  . Cardiac catheterization  2008  . Nephrectomy Left     renal cell cancer  . Patent ductus arterious repair  ~ 1949  . Cholecystectomy  1970s  . Upper gastrointestinal endoscopy    . Total knee arthroplasty  02/07/2012    Procedure: TOTAL KNEE ARTHROPLASTY;  Surgeon: Mauri Pole, MD;  Location: [facility];  Service: Orthopedics;  Laterality: Right;  . Total hip revision Right 2009    initial replacment surg  . I&d knee with poly exchange Right 12/17/2013  Procedure: IRRIGATION AND DEBRIDEMEN RIGHT TOTAL  KNEE WITH POLY EXCHANGE;  Surgeon: Mauri Pole, MD;  [facility];  Service: Orthopedics;  Laterality: Right;  . Patellar tendon repair Right 03/06/2014    Procedure: AVULSION WITH PATELLA TENDON REPAIR;  Surgeon: Mauri Pole, MD;  [facility];  Service: Orthopedics;  Laterality: Right;  . Total knee revision Right 07/29/2014    Procedure: RIGHT KNEE REVISION OF PREVIOUS REPAIR EXTENSOR MECHANISM;  Surgeon: Mauri Pole, MD;  [facility];  Service: Orthopedics;  Laterality: Right;  . Colonoscopy N/A 11/11/2015    Procedure: COLONOSCOPY;  Surgeon: [name], MD;  [facility];   Service: Endoscopy;  Laterality: N/A;  . Esophagogastroduodenoscopy N/A 11/11/2015    Procedure: ESOPHAGOGASTRODUODENOSCOPY (EGD);  Surgeon: [name], MD;  Location: [facility];  Service: Endoscopy;  Laterality: N/A;  . Total abdominal hysterectomy  87  . Laparoscopy N/A 01/15/2016    Procedure: LAPAROSCOPY DIAGNOSTIC;  Surgeon: Stark Klein, MD;  Location: [facility];  Service: General;  Laterality: N/A;  . Gastrectomy N/A 01/15/2016    Procedure: OPEN PARTIAL GASTRECTOMY;  Surgeon: Stark Klein, MD;  Location: [facility];  Service: General;  Laterality: N/A;  . Pacemaker insertion  07/04/11    MDT PPM implanted by Dr Doreatha Lew for tachy/brady  . Esophagogastroduodenoscopy N/A 02/05/2016    Procedure: ESOPHAGOGASTRODUODENOSCOPY (EGD);  Surgeon: Ladene Artist, MD;  Location: [facility];  Service: Endoscopy;  Laterality: N/A;   Family History  Problem Relation Age of Onset  . Colon cancer Mother 56  . Heart disease Mother   . Heart disease Father   . Heart attack Father   . Heart disease Maternal Grandmother    Social History  Substance Use Topics  . Smoking status: Never Smoker   . Smokeless tobacco: Never Used  . Alcohol Use: No   OB History    No data available     Review of Systems  Constitutional: Negative for fever and chills.  HENT: Negative for congestion.   Eyes: Negative for redness.  Respiratory: Negative for shortness of breath.   Cardiovascular: Negative for chest pain.  Gastrointestinal: Positive for nausea, vomiting and diarrhea (chronic, unchanged). Negative for abdominal pain.  Genitourinary: Negative for flank pain.  Musculoskeletal: Negative for neck stiffness.  Skin: Negative for rash.  Neurological: Negative for syncope and speech difficulty.  Psychiatric/Behavioral: Negative for confusion.    Allergies  Amiodarone; Codeine; Gabapentin; Hydrocodone; Morphine and related; Requip; and Zinc  Home Medications   Prior to Admission medications   Medication  Sig Start Date End Date Taking? Authorizing Provider  acetaminophen (TYLENOL) 325 MG tablet Take 1 tablet (325 mg total) by mouth every 6 (six) hours as needed for mild pain, moderate pain, fever or headache. 02/13/16   Mauricio Gerome Apley, MD  ALPRAZolam Duanne Moron) 0.5 MG tablet TAKE 1/2-1 TABLET BY MOUTH 3 TIMES A DAY AS NEEDED FOR ANXIETY 01/01/16   Courtney Forcucci, PA-C  B Complex-C (B-COMPLEX WITH VITAMIN C) tablet Take 1 tablet by mouth daily. 12/11/15   [name], MD  Biotin 5000 MCG CAPS Take 5,000 mcg by mouth every morning.     Historical Provider, MD  cetirizine (ZYRTEC) 10 MG tablet Take 10 mg by mouth at bedtime.    Historical Provider, MD  Cholecalciferol (VITAMIN D PO) Take 5,000 Units by mouth every morning.     Historical Provider, MD  Cyanocobalamin 1000 MCG/ML KIT Inject 1,000 mcg into the skin as directed. Patient taking  differently: Inject 1,000 mcg into the skin every 30 (thirty) days.  11/24/15   Brunetta Genera, MD  diltiazem (CARDIZEM CD) 180 MG 24 hr capsule Take 1 capsule (180 mg total) by mouth daily. 02/13/16   Mauricio Gerome Apley, MD  furosemide (LASIX) 40 MG tablet TAKE 1 TABLET (40 MG TOTAL) BY MOUTH DAILY. 02/07/16   [name], MD  levothyroxine (SYNTHROID, LEVOTHROID) 100 MCG tablet TAKE 1/2 TABLET (50 MCG TOTAL) BY MOUTH EVERY DAY ON EMPTY STOMACH WITH A FULL GLASS OF WATER 02/13/16   Mauricio Gerome Apley, MD  LYRICA 75 MG capsule TAKE ONE CAPSULE BY MOUTH TWICE A DAY 11/29/15   [name], MD  methocarbamol (ROBAXIN) 500 MG tablet Take 1 tablet (500 mg total) by mouth every 8 (eight) hours as needed for muscle spasms. 01/26/16   Stark Klein, MD  metoprolol (LOPRESSOR) 100 MG tablet Take 1 tablet (100 mg total) by mouth 2 (two) times daily. 02/13/16   Mauricio Gerome Apley, MD  Multiple Vitamin (MULTIVITAMIN) tablet Take 1 tablet by mouth daily.    Historical Provider, MD  nitroGLYCERIN (NITROSTAT) 0.4 MG SL tablet Place 1 tablet (0.4 mg total) under  the tongue every 5 (five) minutes as needed for chest pain (MAX 3 TABLETS). 05/19/15   Darlin Coco, MD  ondansetron (ZOFRAN-ODT) 4 MG disintegrating tablet Take 1 tablet (4 mg total) by mouth every 6 (six) hours as needed for nausea. 01/26/16   Stark Klein, MD  pantoprazole (PROTONIX) 40 MG tablet TAKE 1 TABLET (40 MG TOTAL) BY MOUTH 2 (TWO) TIMES DAILY. 12/11/15   [name], MD  potassium chloride SA (K-DUR,KLOR-CON) 20 MEQ tablet Take 20 mEq by mouth 2 (two) times daily.    Historical Provider, MD  sertraline (ZOLOFT) 50 MG tablet Take 1 tablet (50 mg total) by mouth daily. For mood 09/23/15   [name], MD  traMADol (ULTRAM) 50 MG tablet Take 1 tablet (50 mg total) by mouth every 6 (six) hours as needed (pain). Reported on 10/14/2015 01/26/16   Stark Klein, MD  warfarin (COUMADIN) 2 MG tablet TAKE 1 TABLET DAILY OR AS DIRECTED 01/26/16   [name], MD   BP 123/80 mmHg  Pulse 122  Temp(Src) 97.2 F (36.2 C) (Oral)  Resp 18  SpO2 96%  LMP  (Exact Date) Physical Exam  Constitutional: She appears well-developed and well-nourished. No distress.  HENT:  Head: Normocephalic and atraumatic.  Right Ear: External ear normal.  Left Ear: External ear normal.  Nose: Nose normal.  Neck: Normal range of motion.  Cardiovascular: An irregularly irregular rhythm present. Tachycardia present.   Pulses:      Radial pulses are 2+ on the right side, and 2+ on the left side.       Dorsalis pedis pulses are 2+ on the right side, and 2+ on the left side.  HR 100-130s during my exam  Pulmonary/Chest: Effort normal and breath sounds normal. No respiratory distress.  Abdominal: Soft. Bowel sounds are normal. She exhibits no distension. There is no tenderness.  Light pink healing scar over upper mid-abdomen; non-tender  Neurological: She is alert.  Face symmetric, speech clear and appropriate  Skin: Skin is warm and dry. She is not diaphoretic.  Psychiatric: She has a normal mood and affect.     ED Course  Procedures  Labs Review Labs Reviewed  CBC WITH DIFFERENTIAL/PLATELET - Abnormal; Notable for the following:    RDW 17.0 (*)    All other components within normal limits  COMPREHENSIVE METABOLIC PANEL - Abnormal; Notable for the following:    Glucose, Bld 103 (*)    Creatinine, Ser 1.20 (*)    GFR calc non Af Amer 42 (*)    GFR calc Af Amer 48 (*)    All other components within normal limits  PROTIME-INR - Abnormal; Notable for the following:    Prothrombin Time 30.6 (*)    INR 3.00 (*)    All other components within normal limits  I-STAT CHEM 8, ED - Abnormal; Notable for the following:    Creatinine, Ser 1.20 (*)    Glucose, Bld 103 (*)    Hemoglobin 16.0 (*)    HCT 47.0 (*)    All other components within normal limits  LIPASE, BLOOD  I-STAT CG4 LACTIC ACID, ED    Imaging Review Ct Abdomen Pelvis W Contrast  02/17/2016  CLINICAL DATA:  Pt. Presents with complaint of abd pain and esophageal pain. Per pt. She has had stomach CA with recent surgery last month. Pt. States no oncology follow up, surgery only. Pt. Reports decreased appetite, N/V starting this AM around 0930. Pt. Had 3 episodes of emesis this AM. Pt. Has hx of afib, taking coumadin, h/o left renal ca, h/o nephrectomy, gastrectomy EXAM: CT ABDOMEN AND PELVIS WITH CONTRAST TECHNIQUE: Multidetector CT imaging of the abdomen and pelvis was performed using the standard protocol following bolus administration of intravenous contrast. CONTRAST:  86m ISOVUE-300 IOPAMIDOL (ISOVUE-300) INJECTION 61% COMPARISON:  11/12/2015. FINDINGS: Lung bases: Minor scarring or atelectasis the left lower lobe. Minor scarring in adjacent left upper lobe lingula. No evidence of pneumonia edema. Heart is top-normal in size. Hepatobiliary: Unremarkable liver. Gallbladder surgically absent. No significant bile duct dilation. Spleen, pancreas, adrenal glands:  Unremarkable. Kidneys, ureters, bladder: Left kidney is surgically absent.  Normal right kidney. No hydronephrosis. Normal right ureter. Bladder is unremarkable. Uterus and adnexa:  Uterus surgically absent.  No pelvic masses. Vascular: Set densely calcified atherosclerotic plaque noted along the abdominal aorta and its branch vessels. No aneurysm. Lymph nodes:  No enlarged lymph nodes. Ascites:  None. Gastrointestinal: There is some thickening of the wall of the distal esophagus and the adjacent stomach at the level of the anastomosis. There is no convincing mass. Stomach is mostly decompressed. Small bowel and colon are unremarkable. Abdominal wall:  Midline scar.  No hernia. Musculoskeletal: Well-positioned right hip total arthroplasty. Degenerative changes of the visualized spine. No osteoblastic or osteolytic lesions. IMPRESSION: 1. There is some apparent wall thickening of the distal esophagus and adjacent stomach at the gastroesophageal anastomosis. This may reflect inflammation. There is no convincing stomach or esophageal mass and no evidence of metastatic disease. 2. No other findings to account this patient's symptoms. 3. Status post cholecystectomy, left nephrectomy and hysterectomy and right hip total arthroplasty. Electronically Signed   By: DLajean ManesM.D.   On: 02/17/2016 16:10   I have personally reviewed and evaluated these images and lab results as part of my medical decision-making.   EKG Interpretation   Date/Time:  Tuesday February 17 2016 14:49:14 EDT Ventricular Rate:  122 PR Interval:    QRS Duration: 83 QT Interval:  330 QTC Calculation: 470 R Axis:   32 Text Interpretation:  Atrial fibrillation Repol abnrm suggests ischemia,  diffuse leads History of atrial fibrillation with depressions in  inferolateral leads on 01/24/2016 Confirmed by LIU MD, DANA ([phone] on  02/17/2016 2:53:48 PM      MDM   Final diagnoses:  Nausea and vomiting in adult  patient  AKI (acute kidney injury) ([place])  S/P partial gastrectomy  Intractable vomiting with nausea,  vomiting of unspecified type    82 year old female presents to the ED with intractable nausea, vomiting. Remainder of history of present illness, review of systems, and exam as above. Labs were obtained and reviewed by me. Notable for mild AKI. INR 3. No lactic acidosis, anemia, leukocytosis.  CT abdomen pelvis with contrast showed wall thickening of the distal esophagus and adjacent stomach reflecting possible inflammation. No evidence of bowel obstruction.  Zofran ordered for her nausea. This eased her nausea and vomiting. After the CT resulted, Maalox was ordered for her "burning" pain. Will PO challenge.   The patient very much wanted to attempt to go home, however she cannot tolerate PO intake in the ED. She vomited approximately 15-20 seconds after taking Maalox. Prior to this her nausea and vomiting have been controlled with Zofran. Given her inability to tolerate anything by mouth, I discussed her case with the hospitalist, and she'll be admitted to the hospitalist service. [name], general surgery, will be by to see the patient in the morning.   Case managed in conjunction with my attending, Dr. Tomi Bamberger.     Berenice Primas, MD 02/17/16 1727  Dorie Rank, MD 02/17/16 [phone]

## 2016-02-17 NOTE — ED Notes (Signed)
Pt. Presents with complaint of abd pain and esophageal pain. Per pt. She has had stomach CA with recent surgery last month. Pt. States no oncology follow up, surgery only. Pt. Reports decreased appetite, N/V starting this AM around 0930. Pt. Had 3 episodes of emesis this AM. Pt. Has hx of afib, taking coumadin. VSS. Denies abd pain on assessment.

## 2016-02-18 ENCOUNTER — Observation Stay (HOSPITAL_COMMUNITY): Payer: Medicare Other

## 2016-02-18 DIAGNOSIS — I480 Paroxysmal atrial fibrillation: Secondary | ICD-10-CM | POA: Diagnosis not present

## 2016-02-18 DIAGNOSIS — Z903 Acquired absence of stomach [part of]: Secondary | ICD-10-CM | POA: Diagnosis not present

## 2016-02-18 DIAGNOSIS — E038 Other specified hypothyroidism: Secondary | ICD-10-CM | POA: Diagnosis not present

## 2016-02-18 DIAGNOSIS — Z452 Encounter for adjustment and management of vascular access device: Secondary | ICD-10-CM | POA: Diagnosis not present

## 2016-02-18 DIAGNOSIS — N179 Acute kidney failure, unspecified: Secondary | ICD-10-CM | POA: Diagnosis not present

## 2016-02-18 DIAGNOSIS — K210 Gastro-esophageal reflux disease with esophagitis: Secondary | ICD-10-CM | POA: Diagnosis not present

## 2016-02-18 DIAGNOSIS — I10 Essential (primary) hypertension: Secondary | ICD-10-CM | POA: Diagnosis not present

## 2016-02-18 DIAGNOSIS — E860 Dehydration: Secondary | ICD-10-CM | POA: Diagnosis not present

## 2016-02-18 DIAGNOSIS — B370 Candidal stomatitis: Secondary | ICD-10-CM | POA: Diagnosis not present

## 2016-02-18 DIAGNOSIS — C169 Malignant neoplasm of stomach, unspecified: Secondary | ICD-10-CM | POA: Diagnosis not present

## 2016-02-18 LAB — CBC
HEMATOCRIT: 39 % (ref 36.0–46.0)
Hemoglobin: 12.1 g/dL (ref 12.0–15.0)
MCH: 29 pg (ref 26.0–34.0)
MCHC: 31 g/dL (ref 30.0–36.0)
MCV: 93.5 fL (ref 78.0–100.0)
PLATELETS: 178 10*3/uL (ref 150–400)
RBC: 4.17 MIL/uL (ref 3.87–5.11)
RDW: 16.9 % — AB (ref 11.5–15.5)
WBC: 9 10*3/uL (ref 4.0–10.5)

## 2016-02-18 LAB — PROTIME-INR
INR: 3.8 — ABNORMAL HIGH (ref 0.00–1.49)
PROTHROMBIN TIME: 36.6 s — AB (ref 11.6–15.2)

## 2016-02-18 LAB — BASIC METABOLIC PANEL
Anion gap: 7 (ref 5–15)
BUN: 10 mg/dL (ref 6–20)
CHLORIDE: 111 mmol/L (ref 101–111)
CO2: 22 mmol/L (ref 22–32)
CREATININE: 0.99 mg/dL (ref 0.44–1.00)
Calcium: 8.6 mg/dL — ABNORMAL LOW (ref 8.9–10.3)
GFR calc Af Amer: >60 mL/min (ref >60)
GFR calc non Af Amer: 52 mL/min — ABNORMAL LOW (ref >60)
GLUCOSE: 122 mg/dL — AB (ref 65–99)
Potassium: 3.6 mmol/L (ref 3.5–5.1)
SODIUM: 140 mmol/L (ref 135–145)

## 2016-02-18 LAB — GLUCOSE, CAPILLARY
GLUCOSE-CAPILLARY: 95 mg/dL (ref 65–99)
GLUCOSE-CAPILLARY: 109 mg/dL — AB (ref 65–99)

## 2016-02-18 MED ORDER — FLUCONAZOLE IN SODIUM CHLORIDE 200-0.9 MG/100ML-% IV SOLN
200.0000 mg | INTRAVENOUS | Status: DC
  Administered 2016-02-18 – 2016-02-28 (×11): 200 mg via INTRAVENOUS
  Filled 2016-02-18 (×12): qty 100

## 2016-02-18 MED ORDER — SODIUM CHLORIDE 0.45 % IV SOLN
INTRAVENOUS | Status: AC
  Administered 2016-02-18: 20:00:00 via INTRAVENOUS

## 2016-02-18 MED ORDER — FUROSEMIDE 10 MG/ML IJ SOLN
20.0000 mg | Freq: Every day | INTRAMUSCULAR | Status: DC
  Administered 2016-02-18 – 2016-02-23 (×6): 20 mg via INTRAVENOUS
  Filled 2016-02-18 (×8): qty 2

## 2016-02-18 MED ORDER — SODIUM CHLORIDE 0.9% FLUSH
10.0000 mL | INTRAVENOUS | Status: DC | PRN
Start: 2016-02-18 — End: 2016-03-16
  Administered 2016-02-22: 10 mL
  Administered 2016-02-23: 20 mL
  Administered 2016-02-25 – 2016-03-05 (×10): 10 mL
  Administered 2016-03-05: 30 mL
  Administered 2016-03-05: 40 mL
  Administered 2016-03-08 – 2016-03-12 (×5): 10 mL
  Administered 2016-03-13: 20 mL
  Administered 2016-03-14 – 2016-03-16 (×4): 10 mL
  Filled 2016-02-18 (×24): qty 40

## 2016-02-18 MED ORDER — NYSTATIN 100000 UNIT/ML MT SUSP
5.0000 mL | Freq: Four times a day (QID) | OROMUCOSAL | Status: DC
  Administered 2016-02-18 – 2016-02-28 (×35): 500000 [IU] via ORAL
  Filled 2016-02-18 (×38): qty 5

## 2016-02-18 MED ORDER — LEVOTHYROXINE SODIUM 100 MCG IV SOLR
50.0000 ug | Freq: Every day | INTRAVENOUS | Status: DC
  Administered 2016-02-18 – 2016-02-19 (×2): 50 ug via INTRAVENOUS
  Filled 2016-02-18 (×2): qty 5

## 2016-02-18 MED ORDER — FAT EMULSION 20 % IV EMUL
240.0000 mL | INTRAVENOUS | Status: AC
  Administered 2016-02-18: 240 mL via INTRAVENOUS
  Filled 2016-02-18: qty 250

## 2016-02-18 MED ORDER — TRACE MINERALS CR-CU-MN-SE-ZN 10-1000-500-60 MCG/ML IV SOLN
INTRAVENOUS | Status: AC
  Administered 2016-02-18: 19:00:00 via INTRAVENOUS
  Filled 2016-02-18: qty 960

## 2016-02-18 MED ORDER — INSULIN ASPART 100 UNIT/ML ~~LOC~~ SOLN
0.0000 [IU] | SUBCUTANEOUS | Status: DC
  Administered 2016-02-19 – 2016-02-21 (×8): 1 [IU] via SUBCUTANEOUS
  Administered 2016-02-21 (×2): 2 [IU] via SUBCUTANEOUS
  Administered 2016-02-22 – 2016-02-24 (×7): 1 [IU] via SUBCUTANEOUS
  Administered 2016-02-24: 2 [IU] via SUBCUTANEOUS
  Administered 2016-02-25 (×2): 1 [IU] via SUBCUTANEOUS
  Administered 2016-02-25: 2 [IU] via SUBCUTANEOUS
  Administered 2016-02-25 – 2016-03-12 (×40): 1 [IU] via SUBCUTANEOUS
  Administered 2016-03-12: 2 [IU] via SUBCUTANEOUS
  Administered 2016-03-13 – 2016-03-16 (×4): 1 [IU] via SUBCUTANEOUS

## 2016-02-18 MED ORDER — METOPROLOL TARTRATE 5 MG/5ML IV SOLN
10.0000 mg | Freq: Four times a day (QID) | INTRAVENOUS | Status: DC
  Administered 2016-02-18 – 2016-02-28 (×37): 10 mg via INTRAVENOUS
  Filled 2016-02-18 (×39): qty 10

## 2016-02-18 MED ORDER — CETYLPYRIDINIUM CHLORIDE 0.05 % MT LIQD
7.0000 mL | Freq: Two times a day (BID) | OROMUCOSAL | Status: DC
  Administered 2016-02-18 – 2016-03-13 (×35): 7 mL via OROMUCOSAL

## 2016-02-18 MED ORDER — PRO-STAT SUGAR FREE PO LIQD
30.0000 mL | Freq: Every day | ORAL | Status: DC
  Administered 2016-02-22: 30 mL via ORAL
  Filled 2016-02-18 (×4): qty 30

## 2016-02-18 NOTE — Evaluation (Signed)
Clinical/Bedside Swallow Evaluation Patient Details  Name: [name] MRN: [id number] Date of Birth: 24-Apr-1934  Today's Date: 02/18/2016 Time: SLP Start Time (ACUTE ONLY): 31 SLP Stop Time (ACUTE ONLY): 1527 SLP Time Calculation (min) (ACUTE ONLY): 22 min  Past Medical History:  Past Medical History  Diagnosis Date  . PAF (paroxysmal atrial fibrillation) ([place])   . CAD (coronary artery disease)   . Aortic root dilatation ([place])   . Moderate aortic stenosis   . Tachycardia-bradycardia syndrome ([place])     s/p PPM by Dr Doreatha Lew (MDT) 07/03/10  . GERD (gastroesophageal reflux disease)   . Neuropathy ([place])     feet/legs  . Restless leg syndrome   . History of uterine cancer 1980s    treated with hysterectomy, external radiation and radiation seed implants.   . Cancer ([place])     hx Kidney cancer / hx of endometrial cancer  . Anxiety   . HTN (hypertension)   . Hyperlipemia   . Hypothyroidism   . Osteomyelitis ([place]) as child  . Elevated LFTs     hepatic steatosis  . Gastroparesis   . Anal fissure   . Radiation proctitis   . Neuromuscular disorder ([facility])     neuropathy in right leg  . Adenocarcinoma of stomach ([place]) 11/11/2015    gastric mass on egd  . CHF (congestive heart failure) ([place])   . Chronic kidney disease     only has one kidney rt lft rem 03 ca  . 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 Anemia     hx 3/17 iron infusion, on aranesp and injected B12 since 11/2015.   . Stroke ([place]) 15    partial stroke left legally blind   Past Surgical History:  Past Surgical History  Procedure Laterality Date  . Cardiac catheterization  2008  . Nephrectomy Left     renal cell cancer  . Patent ductus arterious repair  ~ 1949  . Cholecystectomy  1970s  . Upper gastrointestinal endoscopy    . Total knee arthroplasty  02/07/2012    Procedure: TOTAL KNEE ARTHROPLASTY;  Surgeon: Mauri Pole, MD;  [facility];  Service: Orthopedics;  Laterality: Right;  . Total hip revision Right 2009     initial replacment surg  . I&d knee with poly exchange Right 12/17/2013    Procedure: IRRIGATION AND DEBRIDEMEN RIGHT TOTAL  KNEE WITH POLY EXCHANGE;  Surgeon: Mauri Pole, MD;  [facility];  Service: Orthopedics;  Laterality: Right;  . Patellar tendon repair Right 03/06/2014    Procedure: AVULSION WITH PATELLA TENDON REPAIR;  Surgeon: Mauri Pole, MD;  [facility];  Service: Orthopedics;  Laterality: Right;  . Total knee revision Right 07/29/2014    Procedure: RIGHT KNEE REVISION OF PREVIOUS REPAIR EXTENSOR MECHANISM;  Surgeon: Mauri Pole, MD;  [facility];  Service: Orthopedics;  Laterality: Right;  . Colonoscopy N/A 11/11/2015    Procedure: COLONOSCOPY;  Surgeon: [name], MD;  [facility];  Service: Endoscopy;  Laterality: N/A;  . Esophagogastroduodenoscopy N/A 11/11/2015    Procedure: ESOPHAGOGASTRODUODENOSCOPY (EGD);  Surgeon: [name], MD;  Location: [facility];  Service: Endoscopy;  Laterality: N/A;  . Total abdominal hysterectomy  87  . Laparoscopy N/A 01/15/2016    Procedure: LAPAROSCOPY DIAGNOSTIC;  Surgeon: Stark Klein, MD;  Location: [facility];  Service: General;  Laterality: N/A;  . Gastrectomy N/A 01/15/2016    Procedure: OPEN PARTIAL GASTRECTOMY;  Surgeon: Stark Klein, MD;  Location: [facility];  Service: General;  Laterality: N/A;  . Pacemaker insertion  07/04/11    MDT PPM implanted by Dr Doreatha Lew for tachy/brady  . Esophagogastroduodenoscopy N/A 02/05/2016    Procedure: ESOPHAGOGASTRODUODENOSCOPY (EGD);  Surgeon: Ladene Artist, MD;  [facility];  Service: Endoscopy;  Laterality: N/A;   HPI:  82 y.o. female with medical history atrial fibrillation, CKD, HTN, kidney cancer and gastric cancer, GERD, CVA, neuromuscular disorder. Per chart pt post proximal gastrectomy early May for gastric adenocarcinoma, tolerating soft diet and was readmitted several days later for nausea and vomiting and upper endoscopy found  grade D esophagitis and  2 small gastric ulcers at the anastomosis (TNA). Admitted now for  "spitting up" but that turned to vomiting this am, burning discomfort in her throat and chest since last admission.No prior ST notes.   Assessment / Plan / Recommendation Clinical Impression  Pt exhibits a primary esophageal dysphagia complains of significant odnophagia primarily after po's. Multiple swallows present likely due to esophagitis or possible stasis (most likely irritation). No indications of aspiration. Pt desires to eat but the pain is prohibitive and family reports the surgeon wants pt to be NPO to heal following surgery. RN/pt/family report plan is to receive PICC line tonight and receive TNA. Order for clear liquid diet is in the chart. Defer to MD's re: if pt should have po's. ST will sign off. Please contact if concerns or questions.        Aspiration Risk  Mild aspiration risk    Diet Recommendation  (clear liquids if allowed per MD)        Other  Recommendations Oral Care Recommendations: Oral care BID   Follow up Recommendations  None    Frequency and Duration            Prognosis        Swallow Study   General Date of Onset:  (early May) HPI: 82 y.o. female with medical history atrial fibrillation, CKD, HTN, kidney cancer and gastric cancer, GERD, CVA, neuromuscular disorder. Per chart pt post proximal gastrectomy early May for gastric adenocarcinoma, tolerating soft diet and was readmitted several days later for nausea and vomiting and upper endoscopy found  grade D esophagitis and 2 small gastric ulcers at the anastomosis (TNA). Admitted now for  "spitting up" but that turned to vomiting this am, burning discomfort in her throat and chest since last admission.No prior ST notes. Type of Study: Bedside Swallow Evaluation Previous Swallow Assessment:  (none) Diet Prior to this Study: NPO Temperature Spikes Noted: No Respiratory Status: Room air History of Recent Intubation:  No Behavior/Cognition: Alert;Cooperative;Pleasant mood Oral Cavity Assessment: Within Functional Limits Oral Care Completed by SLP: No Oral Cavity - Dentition: Dentures, top (natural lower) Vision: Functional for self-feeding Self-Feeding Abilities: Able to feed self Patient Positioning: Upright in chair Baseline Vocal Quality: Normal Volitional Cough: Strong Volitional Swallow: Able to elicit    Oral/Motor/Sensory Function Overall Oral Motor/Sensory Function: Within functional limits   Ice Chips Ice chips: Not tested   Thin Liquid Thin Liquid: Within functional limits Presentation: Cup    Nectar Thick Nectar Thick Liquid: Not tested   Honey Thick Honey Thick Liquid: Not tested   Puree Puree: Within functional limits   Solid   GO   Solid: Not tested    Functional Assessment Tool Used:  (skilled clincial judgement) Functional Limitations: Swallowing Swallow Current Status [id number]): At least 1 percent but less than 20 percent impaired, limited or restricted Swallow Goal Status [phone]): At least  1 percent but less than 20 percent impaired, limited or restricted Swallow Discharge Status [id number]): At least 1 percent but less than 20 percent impaired, limited or restricted   Houston Siren 02/18/2016,3:41 PM  Orbie Pyo Colvin Caroli.Ed [employer] [phone]

## 2016-02-18 NOTE — Progress Notes (Signed)
PROGRESS NOTE    [name]  [id number] DOB: 1933/10/09 DOA: 02/17/2016 PCP: Valerie Richards, MD   Outpatient Specialists:  [name]   Brief Narrative:  [name] is a 82 y.o. female with medical history significant for but not limited to, atrial fibrillation, CKD, HTN, kidney cancer and gastric cancer. She is status post proximal gastrectomy early May for gastric adenocarcinoma. She was discharged home late May on twice daily PPI and Carafate. She was tolerating soft diet. Patient was readmitted several days later for nausea and vomiting. During that admission she underwent upper endoscopy with findings of grade D esophagitis and 2 small gastric ulcers at the anastomosis. She was given TNA during that admission.. Patient was discharged home 02/13/16 on twice a day PPI and metoclopramide but was unable to tolerate metoclopramide secondary to hives so instead took Maalox. Patient was compliant at home with twice daily PPI and antireflux measures but this am patient began vomiting again. She had been "spitting up" but that turned to vomiting this am. She has had burning discomfort in her throat and chest since last admission. No abdominal pain. Stool chronically loose and unchanged.    Assessment & Plan:   Principal Problem:   Intractable nausea and vomiting Active Problems:   Hypothyroidism   Atrial fibrillation (HCC)   Cardiac pacemaker (06/2010)   Essential hypertension   Peripheral nerve disease (HCC)   Adenocarcinoma of stomach (HCC)   Dehydration   Gastroesophageal reflux disease with esophagitis   AKI (acute kidney injury) ([place])   S/P partial gastrectomy   Nausea & vomiting   Recurrent nausea and vomiting / dehydration . - had a subtotal gastrectomy early May for gastric cancer  -Readmitted earlier this month with nausea and vomiting  -Upper endoscopy 02/05/16 with findings of severe esophagitis and 2 nonbleeding small anastomotic ulcers.  -CT scan  today suggests wall thickening of the distal esophagus and adjacent stomach at the gastroesophageal anastomosised inflammation. No other findings, no evidence for obstruction. Etiology of recurrent vomiting unclear at this point.  -IV fluids -Given national shortage of IV Protonix will give IV Pepcid twice daily -Anti-emetics -Cannot tolerate metoclopramide so will try erythromycin IV twice daily. This may worsen her chronic loose stool -Currently her albumin is normal at 3.6. Not taking PO at present but should get calorie count / nutritional consult prior to discharge. -slp eval PICC for TPN-- if not improved, will need PEG tube  Gastric adenocarcinoma, diagnosed in March. Patient is status post subtotal gastrectomy early May. No evidence for metastasis  GERD/ severe esophagitis on recent EGD -Twice a day IV H2 blocker (national shortage of IV PPI) -Anti-reflux measures  Paroxysmal Atrial fib. Chadsvasc 5. On warfarin , INR 3 . Presenting heart rate 122, suspect this was secondary to volume depletion related to vomiting. Heart rate now 90 -IV diltiazem -Monitor on telemetry -Coumadin per pharmacy, would hold today since patient is vomiting  Acute kidney injury. Baseline creatinine 0.7, creatinine 1.2 today. Suspect this will improve with hydration -Avoid nephrotoxic medications -am bmet  Peripheral nerve disease.  -Home ultram as tolerated -Fentanyl prn  Hypothyroidism. -Hopefully she will tolerate home Synthroid tomorrow  Tachycardia bradycardia syndrome, status post dual chamber pacemaker-  Hypertension, controlled.  -Hopefully she will be able to tolerate by mouth meds IV a.m.  History of kidney cancer, status post nephrectomy  Thrush -diflucan -nystatin  DVT prophylaxis:  warfarin  Code Status: DNR   Family Communication: Daughter at bedside  Disposition Plan:  Consultants:     Procedures:     Antimicrobials:       Subjective: Patient  states at home food would go down ok and not cause vomiting, just her medications that were causing issues-- getting stuck metallic taste in mouth Does not appear to be sent home with dilfucan-- was on during last hospitalization  Objective: Filed Vitals:   02/17/16 1930 02/17/16 2028 02/17/16 2344 02/18/16 0511  BP: 145/91 148/74 124/61 122/59  Pulse: 111 106 114 105  Temp:  98.6 F (37 C) 97.5 F (36.4 C) 97.8 F (36.6 C)  TempSrc:  Oral Oral Oral  Resp: 18 18 18 18   Height:  5\' 1"  (1.549 m)    Weight:  58.968 kg (130 lb)  59.24 kg (130 lb 9.6 oz)  SpO2: 95% 98% 94% 96%    Intake/Output Summary (Last 24 hours) at 02/18/16 0923 Last data filed at 02/18/16 0600  Gross per 24 hour  Intake     50 ml  Output    450 ml  Net   -400 ml   Filed Weights   02/17/16 2028 02/18/16 0511  Weight: 58.968 kg (130 lb) 59.24 kg (130 lb 9.6 oz)    Examination:  General exam: Appears calm and comfortable - weak appearing Respiratory system: Clear to auscultation. Respiratory effort normal. Cardiovascular system: S1 & S2 heard, RRR. No JVD, murmurs, rubs, gallops or clicks. No pedal edema. Gastrointestinal system: Abdomen is nondistended, soft and nontender. No organomegaly or masses felt. Normal bowel sounds heard. Central nervous system: Alert and oriented. No focal neurological deficits. Extremities: Symmetric 5 x 5 power. Skin: No rashes, lesions or ulcers Psychiatry: Judgement and insight appear normal. Mood & affect appropriate.     Data Reviewed: I have personally reviewed following labs and imaging studies  CBC:  Recent Labs Lab 02/12/16 0505 02/13/16 0420 02/17/16 1509 02/17/16 1519 02/18/16 0328  WBC 7.0 6.8 9.0  --  9.0  NEUTROABS  --   --  6.0  --   --   HGB 12.1 11.7* 14.6 16.0* 12.1  HCT 38.4 37.4 45.2 47.0* 39.0  MCV 92.5 94.0 93.4  --  93.5  PLT 183 168 217  --  0000000   Basic Metabolic Panel:  Recent Labs Lab 02/12/16 0505 02/13/16 0420 02/17/16 1509  02/17/16 1519 02/18/16 0328  NA 139 140 141 144 140  K 3.8 3.7 4.7 4.7 3.6  CL 108 110 110 108 111  CO2 24 23 23   --  22  GLUCOSE 118* 124* 103* 103* 122*  BUN 17 18 16 19 10   CREATININE 0.74 0.79 1.20* 1.20* 0.99  CALCIUM 9.4 9.1 9.5  --  8.6*  MG 1.7  --   --   --   --   PHOS 3.9  --   --   --   --    GFR: Estimated Creatinine Clearance: 37.5 mL/min (by C-G formula based on Cr of 0.99). Liver Function Tests:  Recent Labs Lab 02/12/16 0505 02/17/16 1509  AST 18 22  ALT 14 21  ALKPHOS 78 90  BILITOT 0.9 0.9  PROT 6.0* 6.9  ALBUMIN 3.1* 3.6    Recent Labs Lab 02/17/16 1509  LIPASE 48   No results for input(s): AMMONIA in the last 168 hours. Coagulation Profile:  Recent Labs Lab 02/12/16 0505 02/13/16 0420 02/17/16 1509 02/18/16 0328  INR 1.75* 2.02* 3.00* 3.80*   Cardiac Enzymes: No results for input(s): CKTOTAL, CKMB, CKMBINDEX, TROPONINI in the last  168 hours. BNP (last 3 results) No results for input(s): PROBNP in the last 8760 hours. HbA1C: No results for input(s): HGBA1C in the last 72 hours. CBG: No results for input(s): GLUCAP in the last 168 hours. Lipid Profile: No results for input(s): CHOL, HDL, LDLCALC, TRIG, CHOLHDL, LDLDIRECT in the last 72 hours. Thyroid Function Tests: No results for input(s): TSH, T4TOTAL, FREET4, T3FREE, THYROIDAB in the last 72 hours. Anemia Panel: No results for input(s): VITAMINB12, FOLATE, FERRITIN, TIBC, IRON, RETICCTPCT in the last 72 hours. Urine analysis:    Component Value Date/Time   COLORURINE YELLOW 02/04/2016 0349   APPEARANCEUR CLOUDY* 02/04/2016 0349   LABSPEC 1.023 02/04/2016 0349   PHURINE 5.5 02/04/2016 0349   GLUCOSEU NEGATIVE 02/04/2016 0349   HGBUR MODERATE* 02/04/2016 0349   BILIRUBINUR NEGATIVE 02/04/2016 0349   KETONESUR NEGATIVE 02/04/2016 0349   PROTEINUR 30* 02/04/2016 0349   UROBILINOGEN 0.2 07/22/2014 1120   NITRITE POSITIVE* 02/04/2016 0349   LEUKOCYTESUR MODERATE* 02/04/2016 0349      )No results found for this or any previous visit (from the past 240 hour(s)).    Anti-infectives    Start     Dose/Rate Route Frequency Ordered Stop   02/18/16 1000  fluconazole (DIFLUCAN) IVPB 200 mg     200 mg 100 mL/hr over 60 Minutes Intravenous Every 24 hours 02/18/16 0917     02/17/16 2200  erythromycin 250 mg in sodium chloride 0.9 % 100 mL IVPB  Status:  Discontinued     250 mg 100 mL/hr over 60 Minutes Intravenous Every 12 hours 02/17/16 1720 02/17/16 1853   02/17/16 2100  erythromycin 250 mg in sodium chloride 0.9 % 100 mL IVPB     250 mg 100 mL/hr over 60 Minutes Intravenous Every 12 hours 02/17/16 1853         Radiology Studies: Ct Abdomen Pelvis W Contrast  02/17/2016  CLINICAL DATA:  Pt. Presents with complaint of abd pain and esophageal pain. Per pt. She has had stomach CA with recent surgery last month. Pt. States no oncology follow up, surgery only. Pt. Reports decreased appetite, N/V starting this AM around 0930. Pt. Had 3 episodes of emesis this AM. Pt. Has hx of afib, taking coumadin, h/o left renal ca, h/o nephrectomy, gastrectomy EXAM: CT ABDOMEN AND PELVIS WITH CONTRAST TECHNIQUE: Multidetector CT imaging of the abdomen and pelvis was performed using the standard protocol following bolus administration of intravenous contrast. CONTRAST:  52mL ISOVUE-300 IOPAMIDOL (ISOVUE-300) INJECTION 61% COMPARISON:  11/12/2015. FINDINGS: Lung bases: Minor scarring or atelectasis the left lower lobe. Minor scarring in adjacent left upper lobe lingula. No evidence of pneumonia edema. Heart is top-normal in size. Hepatobiliary: Unremarkable liver. Gallbladder surgically absent. No significant bile duct dilation. Spleen, pancreas, adrenal glands:  Unremarkable. Kidneys, ureters, bladder: Left kidney is surgically absent. Normal right kidney. No hydronephrosis. Normal right ureter. Bladder is unremarkable. Uterus and adnexa:  Uterus surgically absent.  No pelvic masses. Vascular: Set  densely calcified atherosclerotic plaque noted along the abdominal aorta and its branch vessels. No aneurysm. Lymph nodes:  No enlarged lymph nodes. Ascites:  None. Gastrointestinal: There is some thickening of the wall of the distal esophagus and the adjacent stomach at the level of the anastomosis. There is no convincing mass. Stomach is mostly decompressed. Small bowel and colon are unremarkable. Abdominal wall:  Midline scar.  No hernia. Musculoskeletal: Well-positioned right hip total arthroplasty. Degenerative changes of the visualized spine. No osteoblastic or osteolytic lesions. IMPRESSION: 1. There is some apparent  wall thickening of the distal esophagus and adjacent stomach at the gastroesophageal anastomosis. This may reflect inflammation. There is no convincing stomach or esophageal mass and no evidence of metastatic disease. 2. No other findings to account this patient's symptoms. 3. Status post cholecystectomy, left nephrectomy and hysterectomy and right hip total arthroplasty. Electronically Signed   By: Lajean Manes M.D.   On: 02/17/2016 16:10        Scheduled Meds: . antiseptic oral rinse  7 mL Mouth Rinse BID  . diltiazem  180 mg Oral Daily  . erythromycin  250 mg Intravenous Q12H  . famotidine (PEPCID) IV  20 mg Intravenous Q12H  . fluconazole (DIFLUCAN) IV  200 mg Intravenous Q24H  . furosemide  20 mg Intravenous Daily  . levothyroxine  50 mcg Intravenous Daily  . metoprolol  10 mg Intravenous Q6H  . nystatin  5 mL Oral QID  . warfarin  1 mg Oral ONCE-1800  . Warfarin - Pharmacist Dosing Inpatient   Does not apply q1800   Continuous Infusions: . dextrose 5 % and 0.45% NaCl 100 mL/hr at 02/18/16 0555        Time spent: 25 min    [place], DO [facility] Pager [phone]  If 7PM-7AM, please contact night-coverage [email or web address] Password [facility] 02/18/2016, 9:23 AM

## 2016-02-18 NOTE — Progress Notes (Addendum)
ANTICOAGULATION CONSULT NOTE - follow up  Pharmacy Consult for warfarin Indication: h/o  atrial fibrillation  Allergies  Allergen Reactions  . Amiodarone Other (See Comments)     Thyroid and liver and kidney problems  . Codeine Nausea And Vomiting  . Gabapentin Itching  . Hydrocodone Other (See Comments)    hallucination  . Morphine And Related Nausea And Vomiting  . Requip [Ropinirole Hcl]     Headache   . Zinc Nausea Only    nausea  . Reglan [Metoclopramide] Hives, Itching and Rash    Made face red, also    Vital Signs: Temp: 97.8 F (36.6 C) (06/14 0511) Temp Source: Oral (06/14 0511) BP: 122/59 mmHg (06/14 0511) Pulse Rate: 105 (06/14 0511)   Recent Labs  02/17/16 1509 02/17/16 1519 02/18/16 0328  HGB 14.6 16.0* 12.1  HCT 45.2 47.0* 39.0  PLT 217  --  178  LABPROT 30.6*  --  36.6*  INR 3.00*  --  3.80*  CREATININE 1.20* 1.20* 0.99    Estimated Creatinine Clearance: 37.5 mL/min (by C-G formula based on Cr of 0.99).   Medical History: Past Medical History  Diagnosis Date  . PAF (paroxysmal atrial fibrillation) ([place])   . CAD (coronary artery disease)   . Aortic root dilatation ([place])   . Moderate aortic stenosis   . Tachycardia-bradycardia syndrome ([place])     s/p PPM by Dr Doreatha Lew (MDT) 07/03/10  . GERD (gastroesophageal reflux disease)   . Neuropathy ([place])     feet/legs  . Restless leg syndrome   . History of uterine cancer 1980s    treated with hysterectomy, external radiation and radiation seed implants.   . Cancer ([place])     hx Kidney cancer / hx of endometrial cancer  . Anxiety   . HTN (hypertension)   . Hyperlipemia   . Hypothyroidism   . Osteomyelitis ([place]) as child  . Elevated LFTs     hepatic steatosis  . Gastroparesis   . Anal fissure   . Radiation proctitis   . Neuromuscular disorder (HCC)     neuropathy in right leg  . Adenocarcinoma of stomach ([place]) 11/11/2015    gastric mass on egd  . CHF (congestive heart failure) ([place])   .  Chronic kidney disease     only has one kidney rt lft rem 03 ca  . 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 Anemia     hx 3/17 iron infusion, on aranesp and injected B12 since 11/2015.   . Stroke ([place]) 15    partial stroke left legally blind    Medications:  Scheduled:  . antiseptic oral rinse  7 mL Mouth Rinse BID  . diltiazem  180 mg Oral Daily  . erythromycin  250 mg Intravenous Q12H  . famotidine (PEPCID) IV  20 mg Intravenous Q12H  . furosemide  40 mg Oral Daily  . levothyroxine  100 mcg Oral QAC breakfast  . metoprolol  100 mg Oral BID  . potassium chloride SA  20 mEq Oral BID  . pregabalin  75 mg Oral BID  . sertraline  50 mg Oral Daily  . warfarin  1 mg Oral ONCE-1800  . Warfarin - Pharmacist Dosing Inpatient   Does not apply q1800    Assessment: 82 yo with history of Afib. Admitted for nausea, vomiting and abdominal pain. Pharmacy consulted on 02/17/16 to dose warfarin. INR on admission waas 3.00 at the upper range of therapeutic. PTA patient takes 2mg  daily. Took last dose on  6/11,  A reduced dose of 1mg  was ordered last night but not given- pt/family refused suspect due to nausea & vomiting. Today the INR has increased to 3.8.  Patient is now on Erthyromycin IV which can increase the hypothrombinemic effect of coumadin.  Hgb within normal but has decreased from 16 to 12.1, Hct 47 to 39, Pltc wnl 217>178K.  No bleeding noted.   Goal of Therapy:  INR 2-3 Monitor platelets by anticoagulation protocol: Yes   Plan:  Hold coumadin today Daily INR Follow for signs or symptoms of bleeding  Nicole Cella, RPh Clinical Pharmacist Pager: [phone]  02/18/2016 8:48 AM   ADDENDUM:  Pharmacy consulted to start Lovenox SQ for h/o Afib when INR < 2.0.  Due to patient unable to take oral medications 2/2 nausea, vomiting, abdominal pain.  Plan: follow INR daily, start lovenox when INR <2.0.    Nicole Cella, RPh Clinical Pharmacist Pager: [phone] 02/18/2016 9:44 AM

## 2016-02-18 NOTE — Care Management Obs Status (Signed)
MEDICARE OBSERVATION STATUS NOTIFICATION   Patient Details  Name: [name] MRN: [id number] Date of Birth: 07-Feb-1934   Medicare Observation Status Notification Given:  Yes    Royston Bake, RN 02/18/2016, 3:59 PM

## 2016-02-18 NOTE — Progress Notes (Signed)
Subjective: Admitted for unremitting n/v.  Having n/v even with ice chips.    Objective: Vital signs in last 24 hours: Temp:  [97.2 F (36.2 C)-98.9 F (37.2 C)] 97.8 F (36.6 C) (06/14 0511) Pulse Rate:  [103-122] 105 (06/14 0511) Resp:  [16-23] 18 (06/14 0511) BP: (118-170)/(59-102) 122/59 mmHg (06/14 0511) SpO2:  [94 %-98 %] 96 % (06/14 0511) Weight:  [58.968 kg (130 lb)-59.24 kg (130 lb 9.6 oz)] 59.24 kg (130 lb 9.6 oz) (06/14 0511) Last BM Date: 02/17/16  Intake/Output from previous day: 06/13 0701 - 06/14 0700 In: 50 [I.V.:50] Out: 450 [Urine:450] Intake/Output this shift: Total I/O In: 0  Out: 300 [Urine:300]  General appearance: looks weak Resp: breathing comfortably GI: soft, non tender, non distended  Lab Results:   Recent Labs  02/17/16 1509 02/17/16 1519 02/18/16 0328  WBC 9.0  --  9.0  HGB 14.6 16.0* 12.1  HCT 45.2 47.0* 39.0  PLT 217  --  178   BMET  Recent Labs  02/17/16 1509 02/17/16 1519 02/18/16 0328  NA 141 144 140  K 4.7 4.7 3.6  CL 110 108 111  CO2 23  --  22  GLUCOSE 103* 103* 122*  BUN 16 19 10   CREATININE 1.20* 1.20* 0.99  CALCIUM 9.5  --  8.6*   PT/INR  Recent Labs  02/17/16 1509 02/18/16 0328  LABPROT 30.6* 36.6*  INR 3.00* 3.80*   ABG No results for input(s): PHART, HCO3 in the last 72 hours.  Invalid input(s): PCO2, PO2  Studies/Results: Ct Abdomen Pelvis W Contrast  02/17/2016  CLINICAL DATA:  Pt. Presents with complaint of abd pain and esophageal pain. Per pt. She has had stomach CA with recent surgery last month. Pt. States no oncology follow up, surgery only. Pt. Reports decreased appetite, N/V starting this AM around 0930. Pt. Had 3 episodes of emesis this AM. Pt. Has hx of afib, taking coumadin, h/o left renal ca, h/o nephrectomy, gastrectomy EXAM: CT ABDOMEN AND PELVIS WITH CONTRAST TECHNIQUE: Multidetector CT imaging of the abdomen and pelvis was performed using the standard protocol following bolus  administration of intravenous contrast. CONTRAST:  38mL ISOVUE-300 IOPAMIDOL (ISOVUE-300) INJECTION 61% COMPARISON:  11/12/2015. FINDINGS: Lung bases: Minor scarring or atelectasis the left lower lobe. Minor scarring in adjacent left upper lobe lingula. No evidence of pneumonia edema. Heart is top-normal in size. Hepatobiliary: Unremarkable liver. Gallbladder surgically absent. No significant bile duct dilation. Spleen, pancreas, adrenal glands:  Unremarkable. Kidneys, ureters, bladder: Left kidney is surgically absent. Normal right kidney. No hydronephrosis. Normal right ureter. Bladder is unremarkable. Uterus and adnexa:  Uterus surgically absent.  No pelvic masses. Vascular: Set densely calcified atherosclerotic plaque noted along the abdominal aorta and its branch vessels. No aneurysm. Lymph nodes:  No enlarged lymph nodes. Ascites:  None. Gastrointestinal: There is some thickening of the wall of the distal esophagus and the adjacent stomach at the level of the anastomosis. There is no convincing mass. Stomach is mostly decompressed. Small bowel and colon are unremarkable. Abdominal wall:  Midline scar.  No hernia. Musculoskeletal: Well-positioned right hip total arthroplasty. Degenerative changes of the visualized spine. No osteoblastic or osteolytic lesions. IMPRESSION: 1. There is some apparent wall thickening of the distal esophagus and adjacent stomach at the gastroesophageal anastomosis. This may reflect inflammation. There is no convincing stomach or esophageal mass and no evidence of metastatic disease. 2. No other findings to account this patient's symptoms. 3. Status post cholecystectomy, left nephrectomy and hysterectomy and right hip total  arthroplasty. Electronically Signed   By: Lajean Manes M.D.   On: 02/17/2016 16:10    Anti-infectives: Anti-infectives    Start     Dose/Rate Route Frequency Ordered Stop   02/18/16 1000  fluconazole (DIFLUCAN) IVPB 200 mg     200 mg 100 mL/hr over 60  Minutes Intravenous Every 24 hours 02/18/16 0917     02/17/16 2200  erythromycin 250 mg in sodium chloride 0.9 % 100 mL IVPB  Status:  Discontinued     250 mg 100 mL/hr over 60 Minutes Intravenous Every 12 hours 02/17/16 1720 02/17/16 1853   02/17/16 2100  erythromycin 250 mg in sodium chloride 0.9 % 100 mL IVPB     250 mg 100 mL/hr over 60 Minutes Intravenous Every 12 hours 02/17/16 1853        Assessment/Plan: s/p * No surgery found * s/p partial gastrectomy 3-4 weeks ago.  Esophagitis  Restart TNA  IV diflucan.    IV meds.     If esophagitis does not improve, will place J tube for feeding next week.     [facility] 02/18/2016

## 2016-02-18 NOTE — Care Management Note (Signed)
Case Management Note  Patient Details  Name: [name] MRN: [id number] Date of Birth: 10/21/1933  Subjective/Objective:       Admitted with intractable nausea and vomiting             Action/Plan: Patient recently discharged home with family members; [facility] following for home TNA ; CM will continue to follow for DCP.  Expected Discharge Date:       Possibly 02/24/2016           Expected Discharge Plan:  [place]  Discharge planning Services  CM Consult  Post Acute Care Choice:  Home Health Choice offered to:  Patient, Adult Children  HH Arranged:  RN, PT, Nurse's Aide, Speech Therapy [place] Agency:  [place]  Status of Service:  In progress Medicare Important Message Given:  Yes  Sherrilyn Rist [id number] 02/18/2016, 10:20 AM

## 2016-02-18 NOTE — Progress Notes (Signed)
Center NOTE  Pharmacy Consult for TPN Indication: intolerance to enteral feeds  Allergies  Allergen Reactions  . Amiodarone Other (See Comments)     Thyroid and liver and kidney problems  . Codeine Nausea And Vomiting  . Gabapentin Itching  . Hydrocodone Other (See Comments)    hallucination  . Morphine And Related Nausea And Vomiting  . Requip [Ropinirole Hcl]     Headache   . Zinc Nausea Only    nausea  . Reglan [Metoclopramide] Hives, Itching and Rash    Made face red, also    Patient Measurements: Height: _0  (154.9 cm) Weight: 130 lb 9.6 oz (59.24 kg) (scale a) IBW/kg (Calculated) : 47.8  Vital Signs: Temp: 97.8 F (36.6 C) (06/14 0511) Temp Source: Oral (06/14 0511) BP: 122/59 mmHg (06/14 0511) Pulse Rate: 105 (06/14 0511) Intake/Output from previous day: 06/13 0701 - 06/14 0700 In: 50 [I.V.:50] Out: 450 [Urine:450] Intake/Output from this shift:    Labs:  Recent Labs  02/17/16 1509 02/17/16 1519 02/18/16 0328  WBC 9.0  --  9.0  HGB 14.6 16.0* 12.1  HCT 45.2 47.0* 39.0  PLT 217  --  178  INR 3.00*  --  3.80*     Recent Labs  02/17/16 1509 02/17/16 1519 02/18/16 0328  NA 141 144 140  K 4.7 4.7 3.6  CL 110 108 111  CO2 23  --  22  GLUCOSE 103* 103* 122*  BUN _1 CREATININE 1.20* 1.20* 0.99  CALCIUM 9.5  --  8.6*  PROT 6.9  --   --   ALBUMIN 3.6  --   --   AST 22  --   --   ALT 21  --   --   ALKPHOS 90  --   --   BILITOT 0.9  --   --    Estimated Creatinine Clearance: 37.5 mL/min (by C-G formula based on Cr of 0.99).   No results for input(s): GLUCAP in the last 72 hours.   Insulin Requirements in the past 24 hours:  none  Admit: 76 YOF with history of kidney and gastric cancer, s/p proximal gastrectomy in early May. She was readmitted earlier in June with unretractable N/V and was on TPN at that time. This was discontinued prior to her going home- confirmed with Wynona Luna from [place].    Surgeries/Procedures: nothing this admission.  GI: GERD / anal fissure / gastric cancer. EGD during last admission showed esophagitis, gastric ulcer at gastric anastomosis She was to be on liquids only for 2 weeks at home. Last BM 6/13. On erythromycin for gastroparesis  Endo: hypothyroid on Synthroid. No hx DM - CBGs well controlled with minimal SSI use during last admission and SSI was discontinued  Lytes:   Renal: K 3.6, no phos or mag yet this admission- values from 6/8 were normal  Pulm: 96/RA  Cards: PAF / CAD / AS / tachy-brady syndrome post PPM / HTN / HLD / CHF, Afib. BP soft side but ok, HR tachy- Lopressor, diltiazem, warfarin  Hepatobil: LFTs all WNL yesterday  Neuro: neuropathy / RLS / migraine / anxiety / hx CVA  ID: no acute issues.   Best Practices: warfarin  TPN Access: none yet- awaiting PICC placement TPN start date: 6/14>>  Current Nutrition:  NPO D5-1/2NS @ 112m/hr- provides ~480kcal in 24h period  Nutritional Goals:  1600-1800 kCal, 75-90 grams of protein per day- per goals from last admission, will await  to see if RD has updated recommendations  Plan:  -Clinimix E 5/15 at 75m/hr + IV lipids at 188mhr- this provides 48g protein and 1161kcal in a 24h period. *Last admission, patient's goal rate was Clinimix E 5/15 [id number] + lipids at 80m6m which met 100% of needs -MVI and trace elements in TPN -will stop IV Pepcid and add 40mg16mTPN bag -change MIVF to 1/2NS @ 60mL/2mhen TPN starts tonight  -start sensitive SSI and CBGs q4h -follow GI recommendations, any updated recommendations from RD -full TPN labs tomorrow as per protocol   Surya Folden D. Shelley Pooley, PharmD, BCPS Clinical Pharmacist Pager: [phone] 10:05 AM

## 2016-02-18 NOTE — Progress Notes (Signed)
Peripherally Inserted Central Catheter/Midline Placement  The IV Nurse has discussed with the patient and/or persons authorized to consent for the patient, the purpose of this procedure and the potential benefits and risks involved with this procedure.  The benefits include less needle sticks, lab draws from the catheter and patient may be discharged home with the catheter.  Risks include, but not limited to, infection, bleeding, blood clot (thrombus formation), and puncture of an artery; nerve damage and irregular heat beat.  Alternatives to this procedure were also discussed.  PICC/Midline Placement Documentation  PICC Double Lumen [id number] PICC Left Cephalic 40 cm 0 cm (Active)  Indication for Insertion or Continuance of Line Administration of hyperosmolar/irritating solutions (i.e. TPN, Vancomycin, etc.) 02/18/2016  4:47 PM  Exposed Catheter (cm) 0 cm 02/18/2016  4:47 PM  Dressing Change Due 02/25/16 02/18/2016  4:47 PM       Jeannine Pennisi, Nicolette Bang 02/18/2016, 4:48 PM

## 2016-02-18 NOTE — Progress Notes (Signed)
Initial Nutrition Assessment  DOCUMENTATION CODES:   Non-severe (moderate) malnutrition in context of chronic illness  INTERVENTION:  TPN per pharmacy, recommend trace minerals Provide Pro-stat PO once daily, provides 15 grams of protein and 100 kcal Monitor diet advancement and PO tolerance   NUTRITION DIAGNOSIS:   Malnutrition related to chronic illness, altered GI function as evidenced by mild depletion of body fat, mild depletion of muscle mass, percent weight loss.   GOAL:   Patient will meet greater than or equal to 90% of their needs   MONITOR:   Diet advancement, PO intake, Skin, I & O's, Labs, Weight trends  REASON FOR ASSESSMENT:   Consult New TPN/TNA  ASSESSMENT:   82 y.o. female with a Past Medical History of PAF CAD, tachy/brady syndrome, RLS, GERD, uterine cancer and renal cancer, HTN, HLD, hypothyroidism, gastroparesis, Adenocarcinoma of the stomach s/p partial gastrectomy, ckd, and cva, who presents with nausea vomiting abdominal pain. This seems to be a recurrent issue for patient ever since she underwent partial gastrectomy.   Pt was discharged on 6/9 and reports following a liquid/pureed diet at home. She was drinking fruit smoothies, water and apple juice and eating yogurt, applesauce, and pureed vegetables and stews. She reports tolerating diet until yesterday morning when nausea and vomiting started again. She reports losing from 144 lbs to 128 lbs since her gastrectomy 01/15/16. She has mild muscle and fat wasting per nutrition focused exam. She reports intolerance of most nutritional supplements due to zinc content; states that zinc makes her feel sick and nauseous. She states that she might get a feeding tube if symptoms don't improve. Will need to investigate if there is a TF formula without zinc.   Plan for initiating TPN tonight. Pt on clear liquid diet, but states that she has not taken anything PO today. Pt in good spirits at time of visit.   Labs:  high PT/INR  Diet Order:  TPN (CLINIMIX-E) Adult Diet clear liquid Room service appropriate?: Yes; Fluid consistency:: Thin  Skin:  Wound (see comment) (stage I pressure ulcer on sacrum)  Last BM:  6/13  Height:   Ht Readings from Last 1 Encounters:  02/17/16 5\' 1"  (1.549 m)    Weight:   Wt Readings from Last 1 Encounters:  02/18/16 130 lb 9.6 oz (59.24 kg)    Ideal Body Weight:  47.7 kg  BMI:  Body mass index is 24.69 kg/(m^2).  Estimated Nutritional Needs:   Kcal:  1500-1700  Protein:  75-90 grams  Fluid:  1.5-1.7 L/day  EDUCATION NEEDS:   No education needs identified at this time  [place], LDN Inpatient Clinical Dietitian Pager: [phone] After Hours Pager: [phone]

## 2016-02-19 DIAGNOSIS — Z950 Presence of cardiac pacemaker: Secondary | ICD-10-CM | POA: Diagnosis not present

## 2016-02-19 DIAGNOSIS — N179 Acute kidney failure, unspecified: Secondary | ICD-10-CM | POA: Diagnosis not present

## 2016-02-19 DIAGNOSIS — K210 Gastro-esophageal reflux disease with esophagitis: Secondary | ICD-10-CM | POA: Diagnosis not present

## 2016-02-19 DIAGNOSIS — Z7901 Long term (current) use of anticoagulants: Secondary | ICD-10-CM | POA: Diagnosis not present

## 2016-02-19 DIAGNOSIS — G43A1 Cyclical vomiting, intractable: Secondary | ICD-10-CM | POA: Diagnosis not present

## 2016-02-19 DIAGNOSIS — K560 Paralytic ileus: Secondary | ICD-10-CM | POA: Diagnosis not present

## 2016-02-19 DIAGNOSIS — H548 Legal blindness, as defined in USA: Secondary | ICD-10-CM | POA: Diagnosis present

## 2016-02-19 DIAGNOSIS — Z923 Personal history of irradiation: Secondary | ICD-10-CM | POA: Diagnosis not present

## 2016-02-19 DIAGNOSIS — Z79899 Other long term (current) drug therapy: Secondary | ICD-10-CM | POA: Diagnosis not present

## 2016-02-19 DIAGNOSIS — I509 Heart failure, unspecified: Secondary | ICD-10-CM | POA: Diagnosis not present

## 2016-02-19 DIAGNOSIS — G629 Polyneuropathy, unspecified: Secondary | ICD-10-CM | POA: Diagnosis present

## 2016-02-19 DIAGNOSIS — Z885 Allergy status to narcotic agent status: Secondary | ICD-10-CM | POA: Diagnosis not present

## 2016-02-19 DIAGNOSIS — I129 Hypertensive chronic kidney disease with stage 1 through stage 4 chronic kidney disease, or unspecified chronic kidney disease: Secondary | ICD-10-CM | POA: Diagnosis present

## 2016-02-19 DIAGNOSIS — I77810 Thoracic aortic ectasia: Secondary | ICD-10-CM | POA: Diagnosis present

## 2016-02-19 DIAGNOSIS — I350 Nonrheumatic aortic (valve) stenosis: Secondary | ICD-10-CM | POA: Diagnosis not present

## 2016-02-19 DIAGNOSIS — R1110 Vomiting, unspecified: Secondary | ICD-10-CM | POA: Diagnosis not present

## 2016-02-19 DIAGNOSIS — I739 Peripheral vascular disease, unspecified: Secondary | ICD-10-CM | POA: Diagnosis present

## 2016-02-19 DIAGNOSIS — E785 Hyperlipidemia, unspecified: Secondary | ICD-10-CM | POA: Diagnosis present

## 2016-02-19 DIAGNOSIS — Z9049 Acquired absence of other specified parts of digestive tract: Secondary | ICD-10-CM | POA: Diagnosis not present

## 2016-02-19 DIAGNOSIS — R627 Adult failure to thrive: Secondary | ICD-10-CM | POA: Diagnosis present

## 2016-02-19 DIAGNOSIS — Z90710 Acquired absence of both cervix and uterus: Secondary | ICD-10-CM | POA: Diagnosis not present

## 2016-02-19 DIAGNOSIS — Z8249 Family history of ischemic heart disease and other diseases of the circulatory system: Secondary | ICD-10-CM | POA: Diagnosis not present

## 2016-02-19 DIAGNOSIS — K529 Noninfective gastroenteritis and colitis, unspecified: Secondary | ICD-10-CM | POA: Diagnosis present

## 2016-02-19 DIAGNOSIS — C169 Malignant neoplasm of stomach, unspecified: Secondary | ICD-10-CM | POA: Diagnosis not present

## 2016-02-19 DIAGNOSIS — Z6827 Body mass index (BMI) 27.0-27.9, adult: Secondary | ICD-10-CM | POA: Diagnosis not present

## 2016-02-19 DIAGNOSIS — Z66 Do not resuscitate: Secondary | ICD-10-CM | POA: Diagnosis present

## 2016-02-19 DIAGNOSIS — R633 Feeding difficulties: Secondary | ICD-10-CM | POA: Diagnosis not present

## 2016-02-19 DIAGNOSIS — G2581 Restless legs syndrome: Secondary | ICD-10-CM | POA: Diagnosis present

## 2016-02-19 DIAGNOSIS — Z8673 Personal history of transient ischemic attack (TIA), and cerebral infarction without residual deficits: Secondary | ICD-10-CM | POA: Diagnosis not present

## 2016-02-19 DIAGNOSIS — Z800 Family history of malignant neoplasm of digestive organs: Secondary | ICD-10-CM | POA: Diagnosis not present

## 2016-02-19 DIAGNOSIS — F419 Anxiety disorder, unspecified: Secondary | ICD-10-CM | POA: Diagnosis present

## 2016-02-19 DIAGNOSIS — S301XXA Contusion of abdominal wall, initial encounter: Secondary | ICD-10-CM | POA: Diagnosis not present

## 2016-02-19 DIAGNOSIS — D62 Acute posthemorrhagic anemia: Secondary | ICD-10-CM | POA: Diagnosis not present

## 2016-02-19 DIAGNOSIS — E46 Unspecified protein-calorie malnutrition: Secondary | ICD-10-CM | POA: Diagnosis not present

## 2016-02-19 DIAGNOSIS — R112 Nausea with vomiting, unspecified: Secondary | ICD-10-CM | POA: Diagnosis not present

## 2016-02-19 DIAGNOSIS — M8588 Other specified disorders of bone density and structure, other site: Secondary | ICD-10-CM | POA: Diagnosis present

## 2016-02-19 DIAGNOSIS — B370 Candidal stomatitis: Secondary | ICD-10-CM | POA: Diagnosis not present

## 2016-02-19 DIAGNOSIS — Z96651 Presence of right artificial knee joint: Secondary | ICD-10-CM | POA: Diagnosis present

## 2016-02-19 DIAGNOSIS — Z8542 Personal history of malignant neoplasm of other parts of uterus: Secondary | ICD-10-CM | POA: Diagnosis not present

## 2016-02-19 DIAGNOSIS — I10 Essential (primary) hypertension: Secondary | ICD-10-CM | POA: Diagnosis not present

## 2016-02-19 DIAGNOSIS — Z888 Allergy status to other drugs, medicaments and biological substances status: Secondary | ICD-10-CM | POA: Diagnosis not present

## 2016-02-19 DIAGNOSIS — E440 Moderate protein-calorie malnutrition: Secondary | ICD-10-CM | POA: Diagnosis not present

## 2016-02-19 DIAGNOSIS — Z905 Acquired absence of kidney: Secondary | ICD-10-CM | POA: Diagnosis not present

## 2016-02-19 DIAGNOSIS — N183 Chronic kidney disease, stage 3 (moderate): Secondary | ICD-10-CM | POA: Diagnosis present

## 2016-02-19 DIAGNOSIS — K3184 Gastroparesis: Secondary | ICD-10-CM | POA: Diagnosis not present

## 2016-02-19 DIAGNOSIS — M25551 Pain in right hip: Secondary | ICD-10-CM | POA: Diagnosis not present

## 2016-02-19 DIAGNOSIS — E876 Hypokalemia: Secondary | ICD-10-CM | POA: Diagnosis present

## 2016-02-19 DIAGNOSIS — R935 Abnormal findings on diagnostic imaging of other abdominal regions, including retroperitoneum: Secondary | ICD-10-CM | POA: Diagnosis not present

## 2016-02-19 DIAGNOSIS — E038 Other specified hypothyroidism: Secondary | ICD-10-CM | POA: Diagnosis not present

## 2016-02-19 DIAGNOSIS — E039 Hypothyroidism, unspecified: Secondary | ICD-10-CM | POA: Diagnosis present

## 2016-02-19 DIAGNOSIS — Z5329 Procedure and treatment not carried out because of patient's decision for other reasons: Secondary | ICD-10-CM | POA: Diagnosis present

## 2016-02-19 DIAGNOSIS — Y838 Other surgical procedures as the cause of abnormal reaction of the patient, or of later complication, without mention of misadventure at the time of the procedure: Secondary | ICD-10-CM | POA: Diagnosis not present

## 2016-02-19 DIAGNOSIS — Z903 Acquired absence of stomach [part of]: Secondary | ICD-10-CM | POA: Diagnosis not present

## 2016-02-19 DIAGNOSIS — K760 Fatty (change of) liver, not elsewhere classified: Secondary | ICD-10-CM | POA: Diagnosis present

## 2016-02-19 DIAGNOSIS — E860 Dehydration: Secondary | ICD-10-CM | POA: Diagnosis present

## 2016-02-19 DIAGNOSIS — Z96641 Presence of right artificial hip joint: Secondary | ICD-10-CM | POA: Diagnosis present

## 2016-02-19 DIAGNOSIS — I480 Paroxysmal atrial fibrillation: Secondary | ICD-10-CM | POA: Diagnosis not present

## 2016-02-19 DIAGNOSIS — Z85528 Personal history of other malignant neoplasm of kidney: Secondary | ICD-10-CM | POA: Diagnosis not present

## 2016-02-19 DIAGNOSIS — I2510 Atherosclerotic heart disease of native coronary artery without angina pectoris: Secondary | ICD-10-CM | POA: Diagnosis not present

## 2016-02-19 LAB — CBC
HCT: 38.3 % (ref 36.0–46.0)
HEMOGLOBIN: 12.1 g/dL (ref 12.0–15.0)
MCH: 29.3 pg (ref 26.0–34.0)
MCHC: 31.6 g/dL (ref 30.0–36.0)
MCV: 92.7 fL (ref 78.0–100.0)
PLATELETS: 167 10*3/uL (ref 150–400)
RBC: 4.13 MIL/uL (ref 3.87–5.11)
RDW: 16.5 % — AB (ref 11.5–15.5)
WBC: 5.8 10*3/uL (ref 4.0–10.5)

## 2016-02-19 LAB — DIFFERENTIAL
BASOS ABS: 0 10*3/uL (ref 0.0–0.1)
BASOS PCT: 1 %
Eosinophils Absolute: 0.6 10*3/uL (ref 0.0–0.7)
Eosinophils Relative: 11 %
Lymphocytes Relative: 24 %
Lymphs Abs: 1.4 10*3/uL (ref 0.7–4.0)
MONO ABS: 0.6 10*3/uL (ref 0.1–1.0)
Monocytes Relative: 10 %
NEUTROS ABS: 3.2 10*3/uL (ref 1.7–7.7)
NEUTROS PCT: 54 %

## 2016-02-19 LAB — COMPREHENSIVE METABOLIC PANEL
ALBUMIN: 2.8 g/dL — AB (ref 3.5–5.0)
ALT: 14 U/L (ref 14–54)
ANION GAP: 9 (ref 5–15)
AST: 20 U/L (ref 15–41)
Alkaline Phosphatase: 68 U/L (ref 38–126)
BUN: 10 mg/dL (ref 6–20)
CHLORIDE: 107 mmol/L (ref 101–111)
CO2: 24 mmol/L (ref 22–32)
CREATININE: 0.88 mg/dL (ref 0.44–1.00)
Calcium: 8.6 mg/dL — ABNORMAL LOW (ref 8.9–10.3)
GFR calc non Af Amer: >60 mL/min (ref >60)
Glucose, Bld: 117 mg/dL — ABNORMAL HIGH (ref 65–99)
Potassium: 3.1 mmol/L — ABNORMAL LOW (ref 3.5–5.1)
SODIUM: 140 mmol/L (ref 135–145)
Total Bilirubin: 0.6 mg/dL (ref 0.3–1.2)
Total Protein: 5.8 g/dL — ABNORMAL LOW (ref 6.5–8.1)

## 2016-02-19 LAB — GLUCOSE, CAPILLARY
GLUCOSE-CAPILLARY: 111 mg/dL — AB (ref 65–99)
GLUCOSE-CAPILLARY: 113 mg/dL — AB (ref 65–99)
Glucose-Capillary: 123 mg/dL — ABNORMAL HIGH (ref 65–99)
Glucose-Capillary: 135 mg/dL — ABNORMAL HIGH (ref 65–99)
Glucose-Capillary: 99 mg/dL (ref 65–99)
Glucose-Capillary: 119 mg/dL — ABNORMAL HIGH (ref 65–99)

## 2016-02-19 LAB — MAGNESIUM: Magnesium: 1.5 mg/dL — ABNORMAL LOW (ref 1.7–2.4)

## 2016-02-19 LAB — PREALBUMIN: PREALBUMIN: 19.7 mg/dL (ref 18–38)

## 2016-02-19 LAB — PROTIME-INR
INR: 2.93 — ABNORMAL HIGH (ref 0.00–1.49)
Prothrombin Time: 30.1 seconds — ABNORMAL HIGH (ref 11.6–15.2)

## 2016-02-19 LAB — PHOSPHORUS: PHOSPHORUS: 4 mg/dL (ref 2.5–4.6)

## 2016-02-19 LAB — TRIGLYCERIDES: TRIGLYCERIDES: 181 mg/dL — AB (ref <150)

## 2016-02-19 MED ORDER — TRACE MINERALS CR-CU-MN-SE-ZN 10-1000-500-60 MCG/ML IV SOLN
INTRAVENOUS | Status: AC
  Administered 2016-02-19: 18:00:00 via INTRAVENOUS
  Filled 2016-02-19: qty 1680

## 2016-02-19 MED ORDER — WARFARIN SODIUM 2 MG PO TABS
1.0000 mg | ORAL_TABLET | Freq: Once | ORAL | Status: AC
  Administered 2016-02-19: 1 mg via ORAL
  Filled 2016-02-19 (×2): qty 0.5

## 2016-02-19 MED ORDER — LEVOTHYROXINE SODIUM 100 MCG IV SOLR
25.0000 ug | Freq: Every day | INTRAVENOUS | Status: DC
  Administered 2016-02-20 – 2016-02-22 (×3): 25 ug via INTRAVENOUS
  Filled 2016-02-19 (×3): qty 5

## 2016-02-19 MED ORDER — POTASSIUM CHLORIDE 10 MEQ/50ML IV SOLN
10.0000 meq | INTRAVENOUS | Status: AC
  Administered 2016-02-19 (×6): 10 meq via INTRAVENOUS
  Filled 2016-02-19 (×6): qty 50

## 2016-02-19 MED ORDER — SODIUM CHLORIDE 0.45 % IV SOLN
INTRAVENOUS | Status: DC
  Administered 2016-02-20 – 2016-03-05 (×3): via INTRAVENOUS

## 2016-02-19 MED ORDER — FAT EMULSION 20 % IV EMUL
240.0000 mL | INTRAVENOUS | Status: AC
  Administered 2016-02-19: 240 mL via INTRAVENOUS
  Filled 2016-02-19: qty 250

## 2016-02-19 MED ORDER — MAGNESIUM SULFATE 2 GM/50ML IV SOLN
2.0000 g | Freq: Once | INTRAVENOUS | Status: AC
  Administered 2016-02-19: 2 g via INTRAVENOUS
  Filled 2016-02-19: qty 50

## 2016-02-19 NOTE — Care Management Important Message (Signed)
Important Message  Patient Details  Name: [name] MRN: [id number] Date of Birth: May 29, 1934   Medicare Important Message Given:  Yes    Loann Quill 02/19/2016, 9:33 AM

## 2016-02-19 NOTE — Progress Notes (Signed)
[place] NOTE  Pharmacy Consult for TPN Indication: intolerance to enteral feeds  Allergies  Allergen Reactions  . Amiodarone Other (See Comments)     Thyroid and liver and kidney problems  . Codeine Nausea And Vomiting  . Gabapentin Itching  . Hydrocodone Other (See Comments)    hallucination  . Morphine And Related Nausea And Vomiting  . Requip [Ropinirole Hcl]     Headache   . Zinc Nausea Only    nausea  . Reglan [Metoclopramide] Hives, Itching and Rash    Made face red, also    Patient Measurements: Height: 5\' 1"  (154.9 cm) Weight: 131 lb 9.6 oz (59.693 kg) (scale a) IBW/kg (Calculated) : 47.8  Vital Signs: Temp: 98.8 F (37.1 C) (06/15 0900) Temp Source: Oral (06/15 0900) BP: 147/76 mmHg (06/15 0900) Pulse Rate: 99 (06/15 0900) Intake/Output from previous day: 06/14 0701 - 06/15 0700 In: 1817 [P.O.:360; I.V.:607; IV Piggyback:300; TPN:550] Out: 950 [Urine:950] Intake/Output from this shift: Total I/O In: -  Out: 100 [Urine:100]  Labs:  Recent Labs  02/17/16 1509 02/17/16 1519 02/18/16 0328 02/19/16 0459  WBC 9.0  --  9.0 5.8  HGB 14.6 16.0* 12.1 12.1  HCT 45.2 47.0* 39.0 38.3  PLT 217  --  178 167  INR 3.00*  --  3.80* 2.93*     Recent Labs  02/17/16 1509 02/17/16 1519 02/18/16 0328 02/19/16 0459  NA 141 144 140 140  K 4.7 4.7 3.6 3.1*  CL 110 108 111 107  CO2 23  --  22 24  GLUCOSE 103* 103* 122* 117*  BUN 16 19 10 10   CREATININE 1.20* 1.20* 0.99 0.88  CALCIUM 9.5  --  8.6* 8.6*  MG  --   --   --  1.5*  PHOS  --   --   --  4.0  PROT 6.9  --   --  5.8*  ALBUMIN 3.6  --   --  2.8*  AST 22  --   --  20  ALT 21  --   --  14  ALKPHOS 90  --   --  68  BILITOT 0.9  --   --  0.6  PREALBUMIN  --   --   --  19.7  TRIG  --   --   --  181*   Estimated Creatinine Clearance: 42.3 mL/min (by C-G formula based on Cr of 0.88).    Recent Labs  02/19/16 0006 02/19/16 0425 02/19/16 0743  GLUCAP 111* 113* 123*      Insulin Requirements in the past 24 hours:  1 unit SSI  Admit: 81 YOF with history of kidney and gastric cancer, s/p proximal gastrectomy in early May. She was readmitted earlier in June with unretractable N/V and was on TPN at that time. This was discontinued prior to her going home- confirmed with Wynona Luna from [place].   Surgeries/Procedures: nothing this admission.  GI: GERD / anal fissure / gastric cancer. EGD during last admission showed esophagitis, gastric ulcer at gastric anastomosis She was to be on liquids only for 2 weeks at home. Last BM 6/13. On erythromycin for gastroparesis. Having N/V even with ice chips. Prealbumin 19.7. Dr. Marlowe Aschoff note states if patient's esophagitis does not improve, a Jtube will be placed for feeding next week  Endo: hypothyroid on Synthroid. No hx DM - CBGs well controlled with minimal SSI use during last admission. CBGs 109-123  Lytes: K low at 3.1, Mag low  at 1.5, phos nml at 4, CorCa ~9.3  Renal: SCr 0.88, CrCl ~40-92mL/min- improved  Pulm: 96/RA  Cards: PAF / CAD / AS / tachy-brady syndrome post PPM / HTN / HLD / CHF, Afib. BP soft side but ok, HR tachy- Lopressor, diltiazem, warfarin  Hepatobil: LFTs wnl, albumin low at 2.8  Neuro: neuropathy / RLS / migraine / anxiety / hx CVA.   ID: esophagitis on fluconazole and Nystatin  Best Practices: warfarin, MC  TPN Access: none yet- awaiting PICC placement TPN start date: 6/14>>  Current Nutrition:  -Clinimix E 5/15 at 64mL/hr + IV lipids at 9mL/hr- this provides 48g protein and 1161kcal in a 24h period -Prostat 28mL daily- provides 15g protein and 72kcal  Nutritional Goals: (per RD recommendations 6/14) 1500-1700 kCal, 75-90 grams of protein per day  Plan:  -magnesium sulfate 2g IV x1 and KCl 25mEq IV runs x6 for repletion -Increase Clinimix E 5/15 to 94mL/hr + IV lipids at 49mL/hr- this provides 84g protein and 1672kcal which meets 100% of needs -MVI and trace  elements in TPN -Pepcid 40mg  in TPN -Decrease MIVF to KVO at 1800 tonight with increase in TPN rate -Continue sensitive SSI and CBGs q4h -follow GI/surgery recommendations, any changes to diet -BMET, mag and phos in the morning   Verma Grothaus D. Ruby Dilone, PharmD, BCPS Clinical Pharmacist Pager: [phone] 02/19/2016 9:18 AM

## 2016-02-19 NOTE — Progress Notes (Signed)
PROGRESS NOTE    [name]  [id number] DOB: 27-Apr-1934 DOA: 02/17/2016 PCP: [name], MD   Outpatient Specialists:  [name]   Brief Narrative:  [name] is a 82 y.o. female with medical history significant for but not limited to, atrial fibrillation, CKD, HTN, kidney cancer and gastric cancer. She is status post proximal gastrectomy early May for gastric adenocarcinoma. She was discharged home late May on twice daily PPI and Carafate. She was tolerating soft diet. Patient was readmitted several days later for nausea and vomiting. During that admission she underwent upper endoscopy with findings of grade D esophagitis and 2 small gastric ulcers at the anastomosis. She was given TNA during that admission.. Patient was discharged home 02/13/16 on twice a day PPI and metoclopramide but was unable to tolerate metoclopramide secondary to hives so instead took Maalox. Patient was compliant at home with twice daily PPI and antireflux measures but this am patient began vomiting again. She had been "spitting up" but that turned to vomiting this am. She has had burning discomfort in her throat and chest since last admission. No abdominal pain. Stool chronically loose and unchanged.    Assessment & Plan:   Principal Problem:   Intractable nausea and vomiting Active Problems:   Hypothyroidism   Atrial fibrillation (HCC)   Cardiac pacemaker (06/2010)   Essential hypertension   Peripheral nerve disease (HCC)   Adenocarcinoma of stomach (HCC)   Dehydration   Gastroesophageal reflux disease with esophagitis   AKI (acute kidney injury) ([place])   S/P partial gastrectomy   Nausea & vomiting   Nausea and vomiting   Recurrent nausea and vomiting / dehydration . - had a subtotal gastrectomy early May for gastric cancer  -Readmitted earlier this month with nausea and vomiting  -Upper endoscopy 02/05/16 with findings of severe esophagitis and 2 nonbleeding small  anastomotic ulcers.  -CT scan today suggests wall thickening of the distal esophagus and adjacent stomach at the gastroesophageal anastomosised inflammation. No other findings, no evidence for obstruction. Etiology of recurrent vomiting unclear at this point.  -IV fluids -Given national shortage of IV Protonix will give IV Pepcid twice daily -Anti-emetics -Cannot tolerate metoclopramide so will try erythromycin IV twice daily. This may worsen her chronic loose stool PICC for TPN-- if not improved, will need PEG tube -treat with diflucan for presumed thrush  Gastric adenocarcinoma, diagnosed in March. Patient is status post subtotal gastrectomy early May. No evidence for metastasis  GERD/ severe esophagitis on recent EGD -Twice a day IV H2 blocker (national shortage of IV PPI) -Anti-reflux measures  Paroxysmal Atrial fib. Chadsvasc 5. On warfarin , INR 3 . Presenting heart rate 122, suspect this was secondary to volume depletion related to vomiting. Heart rate now 90 -IV metoprolol -Monitor on telemetry -lovenox when INR < 2  Acute kidney injury. Resolved  Peripheral nerve disease.  -Fentanyl prn  Hypothyroidism. -change to IV  Tachycardia bradycardia syndrome, status post dual chamber pacemaker-  Hypertension, controlled.  -IV  History of kidney cancer, status post nephrectomy  Thrush -diflucan -nystatin  Hypokalemia -replete in TPN  DVT prophylaxis:  Warfarin-- change to lovenox when INR < 2  Code Status: DNR   Family Communication: Daughter at bedside  Disposition Plan:     Consultants:   [name]  Procedures:     Antimicrobials:       Subjective: No burning today  Objective: Filed Vitals:   02/18/16 2042 02/19/16 0017 02/19/16 0429 02/19/16 0900  BP: 141/71  130/70 133/67 147/76  Pulse: 91 79 98 99  Temp: 98 F (36.7 C) 97.2 F (36.2 C) 98.1 F (36.7 C) 98.8 F (37.1 C)  TempSrc: Oral Oral  Oral  Resp:  18 18 18   Height:       Weight:   59.693 kg (131 lb 9.6 oz)   SpO2: 97% 99% 96% 81%    Intake/Output Summary (Last 24 hours) at 02/19/16 1248 Last data filed at 02/19/16 1153  Gross per 24 hour  Intake   1817 ml  Output   1450 ml  Net    367 ml   Filed Weights   02/17/16 2028 02/18/16 0511 02/19/16 0429  Weight: 58.968 kg (130 lb) 59.24 kg (130 lb 9.6 oz) 59.693 kg (131 lb 9.6 oz)    Examination:  General exam: Appears calm and comfortable - weak appearing Respiratory system: Clear to auscultation. Respiratory effort normal. Cardiovascular system: S1 & S2 heard, RRR. No JVD, murmurs, rubs, gallops or clicks. No pedal edema. Gastrointestinal system: Abdomen is nondistended, soft and nontender. No organomegaly or masses felt. Normal bowel sounds heard. Central nervous system: Alert and oriented. No focal neurological deficits. Extremities: Symmetric 5 x 5 power. Skin: No rashes, lesions or ulcers Psychiatry: Judgement and insight appear normal. Mood & affect appropriate.     Data Reviewed: I have personally reviewed following labs and imaging studies  CBC:  Recent Labs Lab 02/13/16 0420 02/17/16 1509 02/17/16 1519 02/18/16 0328 02/19/16 0459  WBC 6.8 9.0  --  9.0 5.8  NEUTROABS  --  6.0  --   --  3.2  HGB 11.7* 14.6 16.0* 12.1 12.1  HCT 37.4 45.2 47.0* 39.0 38.3  MCV 94.0 93.4  --  93.5 92.7  PLT 168 217  --  178 [id number]   Basic Metabolic Panel:  Recent Labs Lab 02/13/16 0420 02/17/16 1509 02/17/16 1519 02/18/16 0328 02/19/16 0459  NA 140 141 144 140 140  K 3.7 4.7 4.7 3.6 3.1*  CL 110 110 108 111 107  CO2 23 23  --  22 24  GLUCOSE 124* 103* 103* 122* 117*  BUN 18 16 19 10 10   CREATININE 0.79 1.20* 1.20* 0.99 0.88  CALCIUM 9.1 9.5  --  8.6* 8.6*  MG  --   --   --   --  1.5*  PHOS  --   --   --   --  4.0   GFR: Estimated Creatinine Clearance: 42.3 mL/min (by C-G formula based on Cr of 0.88). Liver Function Tests:  Recent Labs Lab 02/17/16 1509 02/19/16 0459  AST 22 20   ALT 21 14  ALKPHOS 90 68  BILITOT 0.9 0.6  PROT 6.9 5.8*  ALBUMIN 3.6 2.8*    Recent Labs Lab 02/17/16 1509  LIPASE 48   No results for input(s): AMMONIA in the last 168 hours. Coagulation Profile:  Recent Labs Lab 02/13/16 0420 02/17/16 1509 02/18/16 0328 02/19/16 0459  INR 2.02* 3.00* 3.80* 2.93*   Cardiac Enzymes: No results for input(s): CKTOTAL, CKMB, CKMBINDEX, TROPONINI in the last 168 hours. BNP (last 3 results) No results for input(s): PROBNP in the last 8760 hours. HbA1C: No results for input(s): HGBA1C in the last 72 hours. CBG:  Recent Labs Lab 02/18/16 1956 02/19/16 0006 02/19/16 0425 02/19/16 0743 02/19/16 1116  GLUCAP 109* 111* 113* 123* 135*   Lipid Profile:  Recent Labs  02/19/16 0459  TRIG 181*   Thyroid Function Tests: No results for input(s): TSH, T4TOTAL, FREET4,  T3FREE, THYROIDAB in the last 72 hours. Anemia Panel: No results for input(s): VITAMINB12, FOLATE, FERRITIN, TIBC, IRON, RETICCTPCT in the last 72 hours. Urine analysis:    Component Value Date/Time   COLORURINE YELLOW 02/04/2016 0349   APPEARANCEUR CLOUDY* 02/04/2016 0349   LABSPEC 1.023 02/04/2016 0349   PHURINE 5.5 02/04/2016 0349   GLUCOSEU NEGATIVE 02/04/2016 0349   HGBUR MODERATE* 02/04/2016 0349   BILIRUBINUR NEGATIVE 02/04/2016 0349   KETONESUR NEGATIVE 02/04/2016 0349   PROTEINUR 30* 02/04/2016 0349   UROBILINOGEN 0.2 07/22/2014 1120   NITRITE POSITIVE* 02/04/2016 0349   LEUKOCYTESUR MODERATE* 02/04/2016 0349     )No results found for this or any previous visit (from the past 240 hour(s)).    Anti-infectives    Start     Dose/Rate Route Frequency Ordered Stop   02/18/16 1000  fluconazole (DIFLUCAN) IVPB 200 mg     200 mg 100 mL/hr over 60 Minutes Intravenous Every 24 hours 02/18/16 0917     02/17/16 2200  erythromycin 250 mg in sodium chloride 0.9 % 100 mL IVPB  Status:  Discontinued     250 mg 100 mL/hr over 60 Minutes Intravenous Every 12 hours  02/17/16 1720 02/17/16 1853   02/17/16 2100  erythromycin 250 mg in sodium chloride 0.9 % 100 mL IVPB     250 mg 100 mL/hr over 60 Minutes Intravenous Every 12 hours 02/17/16 1853         Radiology Studies: Ct Abdomen Pelvis W Contrast  02/17/2016  CLINICAL DATA:  Pt. Presents with complaint of abd pain and esophageal pain. Per pt. She has had stomach CA with recent surgery last month. Pt. States no oncology follow up, surgery only. Pt. Reports decreased appetite, N/V starting this AM around 0930. Pt. Had 3 episodes of emesis this AM. Pt. Has hx of afib, taking coumadin, h/o left renal ca, h/o nephrectomy, gastrectomy EXAM: CT ABDOMEN AND PELVIS WITH CONTRAST TECHNIQUE: Multidetector CT imaging of the abdomen and pelvis was performed using the standard protocol following bolus administration of intravenous contrast. CONTRAST:  23mL ISOVUE-300 IOPAMIDOL (ISOVUE-300) INJECTION 61% COMPARISON:  11/12/2015. FINDINGS: Lung bases: Minor scarring or atelectasis the left lower lobe. Minor scarring in adjacent left upper lobe lingula. No evidence of pneumonia edema. Heart is top-normal in size. Hepatobiliary: Unremarkable liver. Gallbladder surgically absent. No significant bile duct dilation. Spleen, pancreas, adrenal glands:  Unremarkable. Kidneys, ureters, bladder: Left kidney is surgically absent. Normal right kidney. No hydronephrosis. Normal right ureter. Bladder is unremarkable. Uterus and adnexa:  Uterus surgically absent.  No pelvic masses. Vascular: Set densely calcified atherosclerotic plaque noted along the abdominal aorta and its branch vessels. No aneurysm. Lymph nodes:  No enlarged lymph nodes. Ascites:  None. Gastrointestinal: There is some thickening of the wall of the distal esophagus and the adjacent stomach at the level of the anastomosis. There is no convincing mass. Stomach is mostly decompressed. Small bowel and colon are unremarkable. Abdominal wall:  Midline scar.  No hernia.  Musculoskeletal: Well-positioned right hip total arthroplasty. Degenerative changes of the visualized spine. No osteoblastic or osteolytic lesions. IMPRESSION: 1. There is some apparent wall thickening of the distal esophagus and adjacent stomach at the gastroesophageal anastomosis. This may reflect inflammation. There is no convincing stomach or esophageal mass and no evidence of metastatic disease. 2. No other findings to account this patient's symptoms. 3. Status post cholecystectomy, left nephrectomy and hysterectomy and right hip total arthroplasty. Electronically Signed   By: Lajean Manes M.D.   On:  02/17/2016 16:10   Dg Chest Port 1 View  02/18/2016  CLINICAL DATA:  PICC line placement EXAM: PORTABLE CHEST 1 VIEW COMPARISON:  Radiographs 02/06/2016. FINDINGS: 1808 hours. Interval improved aeration of the lungs which are now clear. The heart size and mediastinal contours are stable with aortic atherosclerosis. There is new left arm PICC which projects to the lower SVC level. Right subclavian pacemaker leads appear unchanged in the right atrium and right ventricle. No evidence of pneumothorax or significant pleural effusion. IMPRESSION: New left arm PICC projects to the lower SVC level. Interval clearing of the lungs. Electronically Signed   By: Richardean Sale M.D.   On: 02/18/2016 17:42        Scheduled Meds: . antiseptic oral rinse  7 mL Mouth Rinse BID  . erythromycin  250 mg Intravenous Q12H  . feeding supplement (PRO-STAT SUGAR FREE 64)  30 mL Oral Daily  . fluconazole (DIFLUCAN) IV  200 mg Intravenous Q24H  . furosemide  20 mg Intravenous Daily  . insulin aspart  0-9 Units Subcutaneous Q4H  . [START ON 02/20/2016] levothyroxine  25 mcg Intravenous Daily  . metoprolol  10 mg Intravenous Q6H  . nystatin  5 mL Oral QID  . potassium chloride  10 mEq Intravenous Q1 Hr x 6  . warfarin  1 mg Oral ONCE-1800  . Warfarin - Pharmacist Dosing Inpatient   Does not apply q1800   Continuous  Infusions: . sodium chloride 60 mL/hr at 02/18/16 1953  . sodium chloride    . Marland KitchenTPN (CLINIMIX-E) Adult 40 mL/hr at 02/18/16 1900   And  . fat emulsion 240 mL (02/18/16 1900)  . Marland KitchenTPN (CLINIMIX-E) Adult     And  . fat emulsion       LOS: 0 days    Time spent: 25 min    Lone [place], DO [facility] Pager [phone]  If 7PM-7AM, please contact night-coverage [email or web address] Password [facility] 02/19/2016, 12:48 PM

## 2016-02-19 NOTE — Progress Notes (Addendum)
ANTICOAGULATION CONSULT NOTE - follow up  Pharmacy Consult for warfarin Indication: h/o  atrial fibrillation  Allergies  Allergen Reactions  . Amiodarone Other (See Comments)     Thyroid and liver and kidney problems  . Codeine Nausea And Vomiting  . Gabapentin Itching  . Hydrocodone Other (See Comments)    hallucination  . Morphine And Related Nausea And Vomiting  . Requip [Ropinirole Hcl]     Headache   . Zinc Nausea Only    nausea  . Reglan [Metoclopramide] Hives, Itching and Rash    Made face red, also    Vital Signs: Temp: 98.8 F (37.1 C) (06/15 0900) Temp Source: Oral (06/15 0900) BP: 147/76 mmHg (06/15 0900) Pulse Rate: 99 (06/15 0900)   Recent Labs  02/17/16 1509 02/17/16 1519 02/18/16 0328 02/19/16 0459  HGB 14.6 16.0* 12.1 12.1  HCT 45.2 47.0* 39.0 38.3  PLT 217  --  178 167  LABPROT 30.6*  --  36.6* 30.1*  INR 3.00*  --  3.80* 2.93*  CREATININE 1.20* 1.20* 0.99 0.88    Estimated Creatinine Clearance: 42.3 mL/min (by C-G formula based on Cr of 0.88).   Medical History: Past Medical History  Diagnosis Date  . PAF (paroxysmal atrial fibrillation) ([place])   . CAD (coronary artery disease)   . Aortic root dilatation ([place])   . Moderate aortic stenosis   . Tachycardia-bradycardia syndrome ([place])     s/p PPM by Dr Doreatha Lew (MDT) 07/03/10  . GERD (gastroesophageal reflux disease)   . Neuropathy ([place])     feet/legs  . Restless leg syndrome   . History of uterine cancer 1980s    treated with hysterectomy, external radiation and radiation seed implants.   . Cancer ([place])     hx Kidney cancer / hx of endometrial cancer  . Anxiety   . HTN (hypertension)   . Hyperlipemia   . Hypothyroidism   . Osteomyelitis ([place]) as child  . Elevated LFTs     hepatic steatosis  . Gastroparesis   . Anal fissure   . Radiation proctitis   . Neuromuscular disorder (HCC)     neuropathy in right leg  . Adenocarcinoma of stomach ([place]) 11/11/2015    gastric mass on egd  .  CHF (congestive heart failure) ([place])   . Chronic kidney disease     only has one kidney rt lft rem 03 ca  . 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 Anemia     hx 3/17 iron infusion, on aranesp and injected B12 since 11/2015.   . Stroke ([place]) 15    partial stroke left legally blind    Medications:  Scheduled:  . antiseptic oral rinse  7 mL Mouth Rinse BID  . erythromycin  250 mg Intravenous Q12H  . feeding supplement (PRO-STAT SUGAR FREE 64)  30 mL Oral Daily  . fluconazole (DIFLUCAN) IV  200 mg Intravenous Q24H  . furosemide  20 mg Intravenous Daily  . insulin aspart  0-9 Units Subcutaneous Q4H  . levothyroxine  50 mcg Intravenous Daily  . metoprolol  10 mg Intravenous Q6H  . nystatin  5 mL Oral QID  . potassium chloride  10 mEq Intravenous Q1 Hr x 6  . Warfarin - Pharmacist Dosing Inpatient   Does not apply q1800    Assessment: 82 yo with history of Afib. Admitted for nausea, vomiting and abdominal pain. Pharmacy consulted on 02/17/16 to dose warfarin. INR on admission was 3.00 at the upper range of therapeutic. PTA patient  takes 2mg  daily.   INR today is 2.93, therapeutic. Decreased from 3.8 yesterday. No coumadin given yesterday due INR above goal, possibly due to drug interaction with erthyromycin IV which can increase the hypothrombinemic effect of coumadin. Also started on Fluconazole on 6/14 which can increase coumadin effect. Hgb remains stable at 12.1 today, it had dropped yesterday to 12.1 from 16.0 on admit, Pltc wnl [phone].  No bleeding noted.  SCDs on.   Goal of Therapy:  INR 2-3 Monitor platelets by anticoagulation protocol: Yes   Plan:  Give  Coumadin 1 mg today x1 Daily INR Follow for signs or symptoms of bleeding  Nicole Cella, RPh Clinical Pharmacist Pager: [phone]  02/19/2016 12:10 PM

## 2016-02-19 NOTE — Progress Notes (Signed)
Patient ID: [name], female   DOB: 08-Apr-1934, 82 y.o.   MRN: [id number]    Subjective: Continues to have loose stools. Nausea improved. Having much less tightness in her chest.    Objective: Vital signs in last 24 hours: Temp:  [97.2 F (36.2 C)-98.8 F (37.1 C)] 98.8 F (37.1 C) (06/15 0900) Pulse Rate:  [79-99] 99 (06/15 0900) Resp:  [18] 18 (06/15 0900) BP: (130-147)/(67-76) 147/76 mmHg (06/15 0900) SpO2:  [81 %-99 %] 81 % (06/15 0900) Weight:  [59.693 kg (131 lb 9.6 oz)] 59.693 kg (131 lb 9.6 oz) (06/15 0429) Last BM Date: 02/17/16  Intake/Output from previous day: 06/14 0701 - 06/15 0700 In: 1817 [P.O.:360; I.V.:607; IV Piggyback:300; TPN:550] Out: 950 [Urine:950] Intake/Output this shift: Total I/O In: -  Out: 800 [Urine:800]  General appearance: looks weak Resp: breathing comfortably GI: soft, non tender, non distended  Lab Results:   Recent Labs  02/18/16 0328 02/19/16 0459  WBC 9.0 5.8  HGB 12.1 12.1  HCT 39.0 38.3  PLT 178 167   BMET  Recent Labs  02/18/16 0328 02/19/16 0459  NA 140 140  K 3.6 3.1*  CL 111 107  CO2 22 24  GLUCOSE 122* 117*  BUN 10 10  CREATININE 0.99 0.88  CALCIUM 8.6* 8.6*   PT/INR  Recent Labs  02/18/16 0328 02/19/16 0459  LABPROT 36.6* 30.1*  INR 3.80* 2.93*   ABG No results for input(s): PHART, HCO3 in the last 72 hours.  Invalid input(s): PCO2, PO2  Studies/Results: Ct Abdomen Pelvis W Contrast  02/17/2016  CLINICAL DATA:  Pt. Presents with complaint of abd pain and esophageal pain. Per pt. She has had stomach CA with recent surgery last month. Pt. States no oncology follow up, surgery only. Pt. Reports decreased appetite, N/V starting this AM around 0930. Pt. Had 3 episodes of emesis this AM. Pt. Has hx of afib, taking coumadin, h/o left renal ca, h/o nephrectomy, gastrectomy EXAM: CT ABDOMEN AND PELVIS WITH CONTRAST TECHNIQUE: Multidetector CT imaging of the abdomen and pelvis was performed using the  standard protocol following bolus administration of intravenous contrast. CONTRAST:  40mL ISOVUE-300 IOPAMIDOL (ISOVUE-300) INJECTION 61% COMPARISON:  11/12/2015. FINDINGS: Lung bases: Minor scarring or atelectasis the left lower lobe. Minor scarring in adjacent left upper lobe lingula. No evidence of pneumonia edema. Heart is top-normal in size. Hepatobiliary: Unremarkable liver. Gallbladder surgically absent. No significant bile duct dilation. Spleen, pancreas, adrenal glands:  Unremarkable. Kidneys, ureters, bladder: Left kidney is surgically absent. Normal right kidney. No hydronephrosis. Normal right ureter. Bladder is unremarkable. Uterus and adnexa:  Uterus surgically absent.  No pelvic masses. Vascular: Set densely calcified atherosclerotic plaque noted along the abdominal aorta and its branch vessels. No aneurysm. Lymph nodes:  No enlarged lymph nodes. Ascites:  None. Gastrointestinal: There is some thickening of the wall of the distal esophagus and the adjacent stomach at the level of the anastomosis. There is no convincing mass. Stomach is mostly decompressed. Small bowel and colon are unremarkable. Abdominal wall:  Midline scar.  No hernia. Musculoskeletal: Well-positioned right hip total arthroplasty. Degenerative changes of the visualized spine. No osteoblastic or osteolytic lesions. IMPRESSION: 1. There is some apparent wall thickening of the distal esophagus and adjacent stomach at the gastroesophageal anastomosis. This may reflect inflammation. There is no convincing stomach or esophageal mass and no evidence of metastatic disease. 2. No other findings to account this patient's symptoms. 3. Status post cholecystectomy, left nephrectomy and hysterectomy and right hip total arthroplasty.  Electronically Signed   By: Lajean Manes M.D.   On: 02/17/2016 16:10   Dg Chest Port 1 View  02/18/2016  CLINICAL DATA:  PICC line placement EXAM: PORTABLE CHEST 1 VIEW COMPARISON:  Radiographs 02/06/2016.  FINDINGS: 1808 hours. Interval improved aeration of the lungs which are now clear. The heart size and mediastinal contours are stable with aortic atherosclerosis. There is new left arm PICC which projects to the lower SVC level. Right subclavian pacemaker leads appear unchanged in the right atrium and right ventricle. No evidence of pneumothorax or significant pleural effusion. IMPRESSION: New left arm PICC projects to the lower SVC level. Interval clearing of the lungs. Electronically Signed   By: Richardean Sale M.D.   On: 02/18/2016 17:42    Anti-infectives: Anti-infectives    Start     Dose/Rate Route Frequency Ordered Stop   02/18/16 1000  fluconazole (DIFLUCAN) IVPB 200 mg     200 mg 100 mL/hr over 60 Minutes Intravenous Every 24 hours 02/18/16 0917     02/17/16 2200  erythromycin 250 mg in sodium chloride 0.9 % 100 mL IVPB  Status:  Discontinued     250 mg 100 mL/hr over 60 Minutes Intravenous Every 12 hours 02/17/16 1720 02/17/16 1853   02/17/16 2100  erythromycin 250 mg in sodium chloride 0.9 % 100 mL IVPB     250 mg 100 mL/hr over 60 Minutes Intravenous Every 12 hours 02/17/16 1853        Assessment/Plan: s/p * No surgery found * s/p partial gastrectomy 3-4 weeks ago.   Esophagitis- ice chips only for now.  If continues to improve, will do clears tomorrow.   Protein calorie malnutrition  TNA  IV diflucan.    IV meds.    Open J tube next week if unable to successfully transition back to full liquids/soft solids.  Would make this transition slowly given the trouble she has had so far.     [facility] 02/19/2016

## 2016-02-20 ENCOUNTER — Ambulatory Visit (HOSPITAL_COMMUNITY): Payer: Self-pay | Admitting: Nurse Practitioner

## 2016-02-20 LAB — CBC
HCT: 38.3 % (ref 36.0–46.0)
HEMOGLOBIN: 12.5 g/dL (ref 12.0–15.0)
MCH: 30.1 pg (ref 26.0–34.0)
MCHC: 32.6 g/dL (ref 30.0–36.0)
MCV: 92.3 fL (ref 78.0–100.0)
PLATELETS: 169 10*3/uL (ref 150–400)
RBC: 4.15 MIL/uL (ref 3.87–5.11)
RDW: 16.4 % — ABNORMAL HIGH (ref 11.5–15.5)
WBC: 7.2 10*3/uL (ref 4.0–10.5)

## 2016-02-20 LAB — BASIC METABOLIC PANEL
Anion gap: 5 (ref 5–15)
BUN: 16 mg/dL (ref 6–20)
CO2: 25 mmol/L (ref 22–32)
Calcium: 8.7 mg/dL — ABNORMAL LOW (ref 8.9–10.3)
Chloride: 107 mmol/L (ref 101–111)
Creatinine, Ser: 0.82 mg/dL (ref 0.44–1.00)
GFR calc Af Amer: >60 mL/min (ref >60)
GLUCOSE: 107 mg/dL — AB (ref 65–99)
POTASSIUM: 3.7 mmol/L (ref 3.5–5.1)
Sodium: 137 mmol/L (ref 135–145)

## 2016-02-20 LAB — GLUCOSE, CAPILLARY
GLUCOSE-CAPILLARY: 113 mg/dL — AB (ref 65–99)
GLUCOSE-CAPILLARY: 112 mg/dL — AB (ref 65–99)
Glucose-Capillary: 118 mg/dL — ABNORMAL HIGH (ref 65–99)
Glucose-Capillary: 125 mg/dL — ABNORMAL HIGH (ref 65–99)
Glucose-Capillary: 140 mg/dL — ABNORMAL HIGH (ref 65–99)
Glucose-Capillary: 96 mg/dL (ref 65–99)

## 2016-02-20 LAB — PROTIME-INR
INR: 1.8 — AB (ref 0.00–1.49)
Prothrombin Time: 20.8 seconds — ABNORMAL HIGH (ref 11.6–15.2)

## 2016-02-20 LAB — PHOSPHORUS: Phosphorus: 4.3 mg/dL (ref 2.5–4.6)

## 2016-02-20 LAB — MAGNESIUM: Magnesium: 1.8 mg/dL (ref 1.7–2.4)

## 2016-02-20 MED ORDER — ENOXAPARIN SODIUM 60 MG/0.6ML ~~LOC~~ SOLN
1.0000 mg/kg | Freq: Two times a day (BID) | SUBCUTANEOUS | Status: DC
  Administered 2016-02-20 – 2016-02-23 (×8): 60 mg via SUBCUTANEOUS
  Filled 2016-02-20 (×9): qty 0.6

## 2016-02-20 MED ORDER — MAGNESIUM SULFATE 2 GM/50ML IV SOLN
2.0000 g | Freq: Once | INTRAVENOUS | Status: AC
Start: 2016-02-20 — End: 2016-02-20
  Administered 2016-02-20: 2 g via INTRAVENOUS
  Filled 2016-02-20: qty 50

## 2016-02-20 MED ORDER — PREGABALIN 75 MG PO CAPS
75.0000 mg | ORAL_CAPSULE | Freq: Two times a day (BID) | ORAL | Status: DC
  Administered 2016-02-20 – 2016-03-02 (×22): 75 mg via ORAL
  Filled 2016-02-20 (×22): qty 1

## 2016-02-20 MED ORDER — TRACE MINERALS CR-CU-MN-SE-ZN 10-1000-500-60 MCG/ML IV SOLN
INTRAVENOUS | Status: AC
  Administered 2016-02-20: 18:00:00 via INTRAVENOUS
  Filled 2016-02-20: qty 1680

## 2016-02-20 MED ORDER — FAT EMULSION 20 % IV EMUL
240.0000 mL | INTRAVENOUS | Status: AC
  Administered 2016-02-20: 240 mL via INTRAVENOUS
  Filled 2016-02-20: qty 250

## 2016-02-20 NOTE — Progress Notes (Signed)
28 repot recieved  from [facility] the patient sitting comfortably at cardiac chair . Daughter at the bedside

## 2016-02-20 NOTE — Progress Notes (Addendum)
ANTICOAGULATION CONSULT NOTE - Iniital consult (lovenox to start when INR <2, while coumadin on hold)  Pharmacy Consult for Lovenox when INR <2 Indication: h/o  atrial fibrillation  Allergies  Allergen Reactions  . Amiodarone Other (See Comments)     Thyroid and liver and kidney problems  . Codeine Nausea And Vomiting  . Gabapentin Itching  . Hydrocodone Other (See Comments)    hallucination  . Morphine And Related Nausea And Vomiting  . Requip [Ropinirole Hcl]     Headache   . Zinc Nausea Only    nausea  . Reglan [Metoclopramide] Hives, Itching and Rash    Made face red, also    Vital Signs: Temp: 97.6 F (36.4 C) (06/16 0424) Temp Source: Oral (06/16 0424) BP: 137/65 mmHg (06/16 0424) Pulse Rate: 90 (06/16 0424)   Recent Labs  02/18/16 0328 02/19/16 0459 02/20/16 0455  HGB 12.1 12.1 12.5  HCT 39.0 38.3 38.3  PLT 178 167 169  LABPROT 36.6* 30.1* 20.8*  INR 3.80* 2.93* 1.80*  CREATININE 0.99 0.88 0.82    Estimated Creatinine Clearance: 45.6 mL/min (by C-G formula based on Cr of 0.82).   Medical History: Past Medical History  Diagnosis Date  . PAF (paroxysmal atrial fibrillation) ([place])   . CAD (coronary artery disease)   . Aortic root dilatation ([place])   . Moderate aortic stenosis   . Tachycardia-bradycardia syndrome ([place])     s/p PPM by Dr Doreatha Lew (MDT) 07/03/10  . GERD (gastroesophageal reflux disease)   . Neuropathy ([place])     feet/legs  . Restless leg syndrome   . History of uterine cancer 1980s    treated with hysterectomy, external radiation and radiation seed implants.   . Cancer ([place])     hx Kidney cancer / hx of endometrial cancer  . Anxiety   . HTN (hypertension)   . Hyperlipemia   . Hypothyroidism   . Osteomyelitis ([place]) as child  . Elevated LFTs     hepatic steatosis  . Gastroparesis   . Anal fissure   . Radiation proctitis   . Neuromuscular disorder (HCC)     neuropathy in right leg  . Adenocarcinoma of stomach ([place]) 11/11/2015   gastric mass on egd  . CHF (congestive heart failure) ([place])   . Chronic kidney disease     only has one kidney rt lft rem 03 ca  . 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 Anemia     hx 3/17 iron infusion, on aranesp and injected B12 since 11/2015.   . Stroke ([place]) 15    partial stroke left legally blind    Medications:  Scheduled:  . antiseptic oral rinse  7 mL Mouth Rinse BID  . feeding supplement (PRO-STAT SUGAR FREE 64)  30 mL Oral Daily  . fluconazole (DIFLUCAN) IV  200 mg Intravenous Q24H  . furosemide  20 mg Intravenous Daily  . insulin aspart  0-9 Units Subcutaneous Q4H  . levothyroxine  25 mcg Intravenous Daily  . metoprolol  10 mg Intravenous Q6H  . nystatin  5 mL Oral QID  . pregabalin  75 mg Oral BID    Assessment: 82 yo with history of Afib. Admitted for nausea, vomiting and abdominal pain. Pharmacy was consulted on 02/17/16 to dose warfarin. Lovenox pharmacy consult ordered to start when INR <2 Today the warfarin protocol was discontinued. Surgery service notes plan for open J tube next week if unable to successfully transition back to full liquids/soft solids. She is s/p partial gastrectomy  3-4 weeks ago.  INR= 1.8 today, SUBtherapeutic. Hgb remains stable at 12.5 today, Pltc wnl 217>178>167>169k.  No bleeding noted.  SCDs on.   Currently receiving TNA, IV diflucan, IV meds.   Erythromycin was DC'd today.  For lovenox dosing:  H/o afib, now off coumadin and INR is <2 Wt = 60.4 kg, SCr =0.82, age 82 y.o, CrCl ~35-45 ml/min Lovenox dosing 1mg /kg SQ q12h is appropriate.  Goal of Therapy:  INR 2-3 Monitor platelets by anticoagulation protocol: Yes   Plan:  MD dc'd coumadin protocol today Lovenox 60 mg SQ q12h  Monitor renal function and adjust dose as needed. Follow for signs or symptoms of bleeding  Thank you for allowing pharmacy to be part of this patients care team.  Nicole Cella, RPh Clinical Pharmacist Pager: [phone]  02/20/2016 8:36 AM

## 2016-02-20 NOTE — Progress Notes (Signed)
[name] NOTE  Pharmacy Consult for TPN Indication: intolerance to enteral feeds  Allergies  Allergen Reactions  . Amiodarone Other (See Comments)     Thyroid and liver and kidney problems  . Codeine Nausea And Vomiting  . Gabapentin Itching  . Hydrocodone Other (See Comments)    hallucination  . Morphine And Related Nausea And Vomiting  . Requip [Ropinirole Hcl]     Headache   . Zinc Nausea Only    nausea  . Reglan [Metoclopramide] Hives, Itching and Rash    Made face red, also    Patient Measurements: Height: 5\' 1"  (154.9 cm) Weight: 133 lb 3.2 oz (60.419 kg) (a scale) IBW/kg (Calculated) : 47.8  Vital Signs: Temp: 97.6 F (36.4 C) (06/16 0424) Temp Source: Oral (06/16 0424) BP: 137/65 mmHg (06/16 0424) Pulse Rate: 90 (06/16 0424) Intake/Output from previous day: 06/15 0701 - 06/16 0700 In: 1902.5 [I.V.:695; IV Piggyback:600; TPN:607.5] Out: 2125 [Urine:2125] Intake/Output from this shift:    Labs:  Recent Labs  02/18/16 0328 02/19/16 0459 02/20/16 0455  WBC 9.0 5.8 7.2  HGB 12.1 12.1 12.5  HCT 39.0 38.3 38.3  PLT 178 167 169  INR 3.80* 2.93* 1.80*     Recent Labs  02/17/16 1509  02/18/16 0328 02/19/16 0459 02/20/16 0455  NA 141  < > 140 140 137  K 4.7  < > 3.6 3.1* 3.7  CL 110  < > 111 107 107  CO2 23  --  22 24 25   GLUCOSE 103*  < > 122* 117* 107*  BUN 16  < > 10 10 16   CREATININE 1.20*  < > 0.99 0.88 0.82  CALCIUM 9.5  --  8.6* 8.6* 8.7*  MG  --   --   --  1.5* 1.8  PHOS  --   --   --  4.0 4.3  PROT 6.9  --   --  5.8*  --   ALBUMIN 3.6  --   --  2.8*  --   AST 22  --   --  20  --   ALT 21  --   --  14  --   ALKPHOS 90  --   --  68  --   BILITOT 0.9  --   --  0.6  --   PREALBUMIN  --   --   --  19.7  --   TRIG  --   --   --  181*  --   < > = values in this interval not displayed. Estimated Creatinine Clearance: 45.6 mL/min (by C-G formula based on Cr of 0.82).    Recent Labs  02/20/16 0004 02/20/16 0423  02/20/16 0755  GLUCAP 118* 125* 113*    Insulin Requirements in the past 24 hours:  2 units SSI  Admit: 64 YOF with history of kidney and gastric cancer, s/p proximal gastrectomy in early May. She was readmitted earlier in June with unretractable N/V and was on TPN at that time. This was discontinued prior to her going home- confirmed with Wynona Luna from [place].   Surgeries/Procedures: nothing this admission.  GI: GERD / anal fissure / gastric cancer. EGD during last admission showed esophagitis, gastric ulcer at gastric anastomosis She was to be on liquids only for 2 weeks at home.  On erythromycin for gastroparesis. Having loose stools. Having N/V even with ice chips. Prealbumin 19.7. Dr. Marlowe Aschoff note states if patient's esophagitis does not improve, a Jtube will  be placed for feeding next week. May increase diet to clears today if she can tolerate  Endo: hypothyroid on Synthroid. No hx DM -  CBGs 107-135  Lytes: K 3.7, Mag 1.8, Phos 4.3, CorCa ~9.4  Renal: SCr 0.82, CrCl ~40-28mL/min- improved, now stable  Pulm: 98/RA  Cards: PAF / CAD / AS / tachy-brady syndrome post PPM / HTN / HLD / CHF, Afib. VSS- Lopressor, diltiazem  Hepatobil: LFTs wnl, albumin low at 2.8  Neuro: neuropathy / RLS / migraine / anxiety / hx CVA.   ID: esophagitis on fluconazole and Nystatin  Best Practices: Lovenox (holding warfarin), MC  TPN Access: PICC placed 6/14 TPN start date: 6/14>>  Current Nutrition:  -Clinimix E 5/15 at 74mL/hr + IV lipids at 25mL/hr- this provides 84g protein and 1672kcal which meets 100% of needs -Prostat 2mL daily- provides 15g protein and 72kcal (has not received)  Nutritional Goals: (per RD recommendations 6/14) 1500-1700 kCal, 75-90 grams of protein per day  Plan:  -magnesium sulfate 2g IV for repletion -Continue Clinimix E 5/15 at 76mL/hr + IV lipids at 44mL/hr- this provides 84g protein and 1672kcal which meets 100% of needs -MVI and trace  elements in TPN -Pepcid 40mg  in TPN -Continue sensitive SSI and CBGs q4h -follow GI/surgery recommendations, any changes to diet -BMET, mag and phos in the morning   [name], PharmD, BCPS Clinical Pharmacist Pager: [phone] 02/20/2016 8:40 AM

## 2016-02-20 NOTE — Progress Notes (Signed)
PROGRESS NOTE    [name]  [id number] DOB: 03/15/1934 DOA: 02/17/2016 PCP: [name], MD   Outpatient Specialists:  [name]   Brief Narrative:  [name] is a 82 y.o. female with medical history significant for but not limited to, atrial fibrillation, CKD, HTN, kidney cancer and gastric cancer. She is status post proximal gastrectomy early May for gastric adenocarcinoma. She was discharged home late May on twice daily PPI and Carafate. She was tolerating soft diet. Patient was readmitted several days later for nausea and vomiting. During that admission she underwent upper endoscopy with findings of grade D esophagitis and 2 small gastric ulcers at the anastomosis. She was given TNA during that admission.. Patient was discharged home 02/13/16 on twice a day PPI and metoclopramide but was unable to tolerate metoclopramide secondary to hives so instead took Maalox. Patient was compliant at home with twice daily PPI and antireflux measures but this am patient began vomiting again. She had been "spitting up" but that turned to vomiting this am. She has had burning discomfort in her throat and chest since last admission. No abdominal pain. Stool chronically loose and unchanged.    Assessment & Plan:   Principal Problem:   Intractable nausea and vomiting Active Problems:   Hypothyroidism   Atrial fibrillation (HCC)   Cardiac pacemaker (06/2010)   Essential hypertension   Peripheral nerve disease (HCC)   Adenocarcinoma of stomach (HCC)   Dehydration   Gastroesophageal reflux disease with esophagitis   AKI (acute kidney injury) ([place])   S/P partial gastrectomy   Nausea & vomiting   Nausea and vomiting   Recurrent nausea and vomiting / dehydration . - had a subtotal gastrectomy early May for gastric cancer  -Readmitted earlier this month with nausea and vomiting  -Upper endoscopy 02/05/16 with findings of severe esophagitis and 2 nonbleeding small  anastomotic ulcers.  -CT scan today suggests wall thickening of the distal esophagus and adjacent stomach at the gastroesophageal anastomosised inflammation. No other findings, no evidence for obstruction. Etiology of recurrent vomiting unclear at this point.  -IV fluids -Given national shortage of IV Protonix will give IV Pepcid twice daily -Anti-emetics -d/c IV erythromycin-- watch symptoms PICC for TPN-- if not improved, will need PEG tube -treat with diflucan for presumed thrush  Gastric adenocarcinoma, diagnosed in March. Patient is status post subtotal gastrectomy early May. No evidence for metastasis  GERD/ severe esophagitis on recent EGD -Twice a day IV H2 blocker (national shortage of IV PPI) -Anti-reflux measures  Paroxysmal Atrial fib. Chadsvasc 5. On warfarin , INR 3 . Presenting heart rate 122, suspect this was secondary to volume depletion related to vomiting. Heart rate now 90 -IV metoprolol -Monitor on telemetry -lovenox when INR < 2- holding coumadin as may need procedure  Acute kidney injury. Resolved  Peripheral nerve disease.  -Fentanyl prn  Hypothyroidism. -change to IV-- 1/2 dose  Tachycardia bradycardia syndrome, status post dual chamber pacemaker-  Hypertension, controlled.  -IV  History of kidney cancer, status post nephrectomy  Thrush -diflucan -nystatin  Hypokalemia -replete in TPN  DVT prophylaxis:  Warfarin-- change to lovenox when INR < 2  Code Status: DNR   Family Communication: Daughter at bedside  Disposition Plan:     Consultants:   [name]  Procedures:     Antimicrobials:       Subjective: Able to take pill yesterday with out trouble  Objective: Filed Vitals:   02/19/16 1524 02/19/16 2028 02/20/16 0000 02/20/16 0424  BP:  142/84 137/61 122/59 137/65  Pulse: 99 81 79 90  Temp: 97.1 F (36.2 C) 98.7 F (37.1 C) 98.6 F (37 C) 97.6 F (36.4 C)  TempSrc: Oral Oral Oral Oral  Resp: 18 17 18 16    Height:      Weight:    60.419 kg (133 lb 3.2 oz)  SpO2: 100% 97% 98% 98%    Intake/Output Summary (Last 24 hours) at 02/20/16 0900 Last data filed at 02/20/16 0847  Gross per 24 hour  Intake 1902.5 ml  Output   2125 ml  Net -222.5 ml   Filed Weights   02/18/16 0511 02/19/16 0429 02/20/16 0424  Weight: 59.24 kg (130 lb 9.6 oz) 59.693 kg (131 lb 9.6 oz) 60.419 kg (133 lb 3.2 oz)    Examination:  General exam: Appears calm and comfortable  Respiratory system: Clear to auscultation. Respiratory effort normal. Cardiovascular system: S1 & S2 heard, RRR. No JVD, murmurs, rubs, gallops or clicks. No pedal edema. Gastrointestinal system: Abdomen is nondistended, soft and nontender. No organomegaly or masses felt. Normal bowel sounds heard. Central nervous system: Alert and oriented. No focal neurological deficits.     Data Reviewed: I have personally reviewed following labs and imaging studies  CBC:  Recent Labs Lab 02/17/16 1509 02/17/16 1519 02/18/16 0328 02/19/16 0459 02/20/16 0455  WBC 9.0  --  9.0 5.8 7.2  NEUTROABS 6.0  --   --  3.2  --   HGB 14.6 16.0* 12.1 12.1 12.5  HCT 45.2 47.0* 39.0 38.3 38.3  MCV 93.4  --  93.5 92.7 92.3  PLT 217  --  178 167 123XX123   Basic Metabolic Panel:  Recent Labs Lab 02/17/16 1509 02/17/16 1519 02/18/16 0328 02/19/16 0459 02/20/16 0455  NA 141 144 140 140 137  K 4.7 4.7 3.6 3.1* 3.7  CL 110 108 111 107 107  CO2 23  --  22 24 25   GLUCOSE 103* 103* 122* 117* 107*  BUN 16 19 10 10 16   CREATININE 1.20* 1.20* 0.99 0.88 0.82  CALCIUM 9.5  --  8.6* 8.6* 8.7*  MG  --   --   --  1.5* 1.8  PHOS  --   --   --  4.0 4.3   GFR: Estimated Creatinine Clearance: 45.6 mL/min (by C-G formula based on Cr of 0.82). Liver Function Tests:  Recent Labs Lab 02/17/16 1509 02/19/16 0459  AST 22 20  ALT 21 14  ALKPHOS 90 68  BILITOT 0.9 0.6  PROT 6.9 5.8*  ALBUMIN 3.6 2.8*    Recent Labs Lab 02/17/16 1509  LIPASE 48   No results  for input(s): AMMONIA in the last 168 hours. Coagulation Profile:  Recent Labs Lab 02/17/16 1509 02/18/16 0328 02/19/16 0459 02/20/16 0455  INR 3.00* 3.80* 2.93* 1.80*   Cardiac Enzymes: No results for input(s): CKTOTAL, CKMB, CKMBINDEX, TROPONINI in the last 168 hours. BNP (last 3 results) No results for input(s): PROBNP in the last 8760 hours. HbA1C: No results for input(s): HGBA1C in the last 72 hours. CBG:  Recent Labs Lab 02/19/16 1640 02/19/16 2013 02/20/16 0004 02/20/16 0423 02/20/16 0755  GLUCAP 99 119* 118* 125* 113*   Lipid Profile:  Recent Labs  02/19/16 0459  TRIG 181*   Thyroid Function Tests: No results for input(s): TSH, T4TOTAL, FREET4, T3FREE, THYROIDAB in the last 72 hours. Anemia Panel: No results for input(s): VITAMINB12, FOLATE, FERRITIN, TIBC, IRON, RETICCTPCT in the last 72 hours. Urine analysis:    Component  Value Date/Time   COLORURINE YELLOW 02/04/2016 0349   APPEARANCEUR CLOUDY* 02/04/2016 0349   LABSPEC 1.023 02/04/2016 0349   PHURINE 5.5 02/04/2016 0349   GLUCOSEU NEGATIVE 02/04/2016 0349   HGBUR MODERATE* 02/04/2016 0349   BILIRUBINUR NEGATIVE 02/04/2016 0349   KETONESUR NEGATIVE 02/04/2016 0349   PROTEINUR 30* 02/04/2016 0349   UROBILINOGEN 0.2 07/22/2014 1120   NITRITE POSITIVE* 02/04/2016 0349   LEUKOCYTESUR MODERATE* 02/04/2016 0349     )No results found for this or any previous visit (from the past 240 hour(s)).    Anti-infectives    Start     Dose/Rate Route Frequency Ordered Stop   02/18/16 1000  fluconazole (DIFLUCAN) IVPB 200 mg     200 mg 100 mL/hr over 60 Minutes Intravenous Every 24 hours 02/18/16 0917     02/17/16 2200  erythromycin 250 mg in sodium chloride 0.9 % 100 mL IVPB  Status:  Discontinued     250 mg 100 mL/hr over 60 Minutes Intravenous Every 12 hours 02/17/16 1720 02/17/16 1853   02/17/16 2100  erythromycin 250 mg in sodium chloride 0.9 % 100 mL IVPB  Status:  Discontinued     250 mg 100 mL/hr  over 60 Minutes Intravenous Every 12 hours 02/17/16 1853 02/20/16 0737       Radiology Studies: Dg Chest Port 1 View  02/18/2016  CLINICAL DATA:  PICC line placement EXAM: PORTABLE CHEST 1 VIEW COMPARISON:  Radiographs 02/06/2016. FINDINGS: 1808 hours. Interval improved aeration of the lungs which are now clear. The heart size and mediastinal contours are stable with aortic atherosclerosis. There is new left arm PICC which projects to the lower SVC level. Right subclavian pacemaker leads appear unchanged in the right atrium and right ventricle. No evidence of pneumothorax or significant pleural effusion. IMPRESSION: New left arm PICC projects to the lower SVC level. Interval clearing of the lungs. Electronically Signed   By: Richardean Sale M.D.   On: 02/18/2016 17:42        Scheduled Meds: . antiseptic oral rinse  7 mL Mouth Rinse BID  . enoxaparin (LOVENOX) injection  1 mg/kg Subcutaneous Q12H  . feeding supplement (PRO-STAT SUGAR FREE 64)  30 mL Oral Daily  . fluconazole (DIFLUCAN) IV  200 mg Intravenous Q24H  . furosemide  20 mg Intravenous Daily  . insulin aspart  0-9 Units Subcutaneous Q4H  . levothyroxine  25 mcg Intravenous Daily  . magnesium sulfate 1 - 4 g bolus IVPB  2 g Intravenous Once  . metoprolol  10 mg Intravenous Q6H  . nystatin  5 mL Oral QID  . pregabalin  75 mg Oral BID   Continuous Infusions: . sodium chloride 10 mL/hr at 02/19/16 1800  . Marland KitchenTPN (CLINIMIX-E) Adult 70 mL/hr at 02/19/16 1745   And  . fat emulsion 240 mL (02/19/16 1745)  . Marland KitchenTPN (CLINIMIX-E) Adult     And  . fat emulsion       LOS: 1 day    Time spent: 25 min    [place], DO [facility] Pager [phone]  If 7PM-7AM, please contact night-coverage [email or web address] Password [email or web address] 02/20/2016, 9:00 AM

## 2016-02-20 NOTE — Care Management Important Message (Signed)
Important Message  Patient Details  Name: [name] MRN: [id number] Date of Birth: 1934-01-31   Medicare Important Message Given:  Yes    Loann Quill 02/20/2016, 9:21 AM

## 2016-02-21 ENCOUNTER — Inpatient Hospital Stay (HOSPITAL_COMMUNITY): Payer: Medicare Other

## 2016-02-21 LAB — BASIC METABOLIC PANEL
Anion gap: 10 (ref 5–15)
BUN: 26 mg/dL — AB (ref 6–20)
CHLORIDE: 103 mmol/L (ref 101–111)
CO2: 23 mmol/L (ref 22–32)
Calcium: 8.7 mg/dL — ABNORMAL LOW (ref 8.9–10.3)
Creatinine, Ser: 0.75 mg/dL (ref 0.44–1.00)
GFR calc Af Amer: >60 mL/min (ref >60)
GFR calc non Af Amer: >60 mL/min (ref >60)
GLUCOSE: 152 mg/dL — AB (ref 65–99)
POTASSIUM: 3 mmol/L — AB (ref 3.5–5.1)
SODIUM: 136 mmol/L (ref 135–145)

## 2016-02-21 LAB — GLUCOSE, CAPILLARY
GLUCOSE-CAPILLARY: 163 mg/dL — AB (ref 65–99)
GLUCOSE-CAPILLARY: 136 mg/dL — AB (ref 65–99)
GLUCOSE-CAPILLARY: 127 mg/dL — AB (ref 65–99)
Glucose-Capillary: 123 mg/dL — ABNORMAL HIGH (ref 65–99)
Glucose-Capillary: 138 mg/dL — ABNORMAL HIGH (ref 65–99)
Glucose-Capillary: 143 mg/dL — ABNORMAL HIGH (ref 65–99)
Glucose-Capillary: 158 mg/dL — ABNORMAL HIGH (ref 65–99)

## 2016-02-21 LAB — PHOSPHORUS: Phosphorus: 4.3 mg/dL (ref 2.5–4.6)

## 2016-02-21 LAB — PROTIME-INR
INR: 1.4 (ref 0.00–1.49)
Prothrombin Time: 17.3 s — ABNORMAL HIGH (ref 11.6–15.2)

## 2016-02-21 LAB — MAGNESIUM: Magnesium: 2 mg/dL (ref 1.7–2.4)

## 2016-02-21 MED ORDER — TRACE MINERALS CR-CU-MN-SE-ZN 10-1000-500-60 MCG/ML IV SOLN
INTRAVENOUS | Status: AC
  Administered 2016-02-21: 18:00:00 via INTRAVENOUS
  Filled 2016-02-21: qty 1680

## 2016-02-21 MED ORDER — LIDOCAINE 5 % EX PTCH
1.0000 | MEDICATED_PATCH | CUTANEOUS | Status: DC
  Administered 2016-02-21 – 2016-03-03 (×13): 1 via TRANSDERMAL
  Filled 2016-02-21 (×15): qty 1

## 2016-02-21 MED ORDER — POTASSIUM CHLORIDE 10 MEQ/50ML IV SOLN
10.0000 meq | INTRAVENOUS | Status: AC
  Administered 2016-02-21 (×3): 10 meq via INTRAVENOUS
  Filled 2016-02-21 (×3): qty 50

## 2016-02-21 MED ORDER — FAT EMULSION 20 % IV EMUL
240.0000 mL | INTRAVENOUS | Status: AC
  Administered 2016-02-21: 240 mL via INTRAVENOUS
  Filled 2016-02-21: qty 250

## 2016-02-21 NOTE — Progress Notes (Signed)
PROGRESS NOTE    [name]  [id number] DOB: 11/11/1933 DOA: 02/17/2016 PCP: [name], MD   Outpatient Specialists:  [name]   Brief Narrative:  [name] is a 82 y.o. female with medical history significant for but not limited to, atrial fibrillation, CKD, HTN, kidney cancer and gastric cancer. She is status post proximal gastrectomy early May for gastric adenocarcinoma. She was discharged home late May on twice daily PPI and Carafate. She was tolerating soft diet. Patient was readmitted several days later for nausea and vomiting. During that admission she underwent upper endoscopy with findings of grade D esophagitis and 2 small gastric ulcers at the anastomosis. She was given TNA during that admission.. Patient was discharged home 02/13/16 on twice a day PPI and metoclopramide but was unable to tolerate metoclopramide secondary to hives so instead took Maalox. Patient was compliant at home with twice daily PPI and antireflux measures but this am patient began vomiting again. She had been "spitting up" but that turned to vomiting this am. She has had burning discomfort in her throat and chest since last admission. No abdominal pain. Stool chronically loose and unchanged.    Assessment & Plan:   Principal Problem:   Intractable nausea and vomiting Active Problems:   Hypothyroidism   Atrial fibrillation (HCC)   Cardiac pacemaker (06/2010)   Essential hypertension   Peripheral nerve disease (HCC)   Adenocarcinoma of stomach (HCC)   Dehydration   Gastroesophageal reflux disease with esophagitis   AKI (acute kidney injury) ([place])   S/P partial gastrectomy   Nausea & vomiting   Nausea and vomiting   Recurrent nausea and vomiting / dehydration . - had a subtotal gastrectomy early May for gastric cancer  -Readmitted earlier this month with nausea and vomiting  -Upper endoscopy 02/05/16 with findings of severe esophagitis and 2 nonbleeding small  anastomotic ulcers.  -CT scan today suggests wall thickening of the distal esophagus and adjacent stomach at the gastroesophageal anastomosised inflammation. No other findings, no evidence for obstruction. Etiology of recurrent vomiting unclear at this point.  -IV fluids -Given national shortage of IV Protonix will give IV Pepcid twice daily -Anti-emetics -d/c IV erythromycin-- watch symptoms PICC for TPN-- if not improved, will need PEG tube- will start calorie count--- from what she took in this AM doubt she can ingest enough protein -treat with diflucan for presumed thrush  Gastric adenocarcinoma, diagnosed in March. Patient is status post subtotal gastrectomy early May. No evidence for metastasis  GERD/ severe esophagitis on recent EGD -Twice a day IV H2 blocker (national shortage of IV PPI) -Anti-reflux measures  Paroxysmal Atrial fib. Chadsvasc 5. On warfarin , INR 3 . Presenting heart rate 122, suspect this was secondary to volume depletion related to vomiting. Heart rate now 90 -IV metoprolol -Monitor on telemetry -lovenox when INR < 2- holding coumadin as may need procedure (J tube)  Acute kidney injury. Resolved  Peripheral nerve disease.  -Fentanyl prn  Hypothyroidism. -change to IV-- 1/2 dose  Tachycardia bradycardia syndrome, status post dual chamber pacemaker-  Hypertension, controlled.  -IV  History of kidney cancer, status post nephrectomy  Thrush -diflucan -nystatin  Hypokalemia -replete in TPN  Right hip pain lidocaine patch -xray shows osteopenia  DVT prophylaxis:  Warfarin-- change to lovenox when INR < 2  Code Status: DNR   Family Communication: Daughter at bedside  Disposition Plan:     Consultants:   [name]  Procedures:     Antimicrobials:  Subjective: Hip pain today-- she also has nausea which she contributes to the hip pain   Objective: Filed Vitals:   02/21/16 0015 02/21/16 0446 02/21/16 0806 02/21/16  1236  BP: 131/79 109/63  107/64  Pulse: 91 97  95  Temp: 98.5 F (36.9 C) 98 F (36.7 C)  97.9 F (36.6 C)  TempSrc: Oral Oral  Oral  Resp: 16 18  18   Height:      Weight:   59.7 kg (131 lb 9.8 oz)   SpO2: 100% 99%  100%    Intake/Output Summary (Last 24 hours) at 02/21/16 1435 Last data filed at 02/21/16 1406  Gross per 24 hour  Intake 10115.16 ml  Output   2152 ml  Net 7963.16 ml   Filed Weights   02/19/16 0429 02/20/16 0424 02/21/16 0806  Weight: 59.693 kg (131 lb 9.6 oz) 60.419 kg (133 lb 3.2 oz) 59.7 kg (131 lb 9.8 oz)    Examination:  General exam: Appears calm and comfortable  Respiratory system: Clear to auscultation. Respiratory effort normal. Cardiovascular system: S1 & S2 heard, RRR. No JVD, murmurs, rubs, gallops or clicks. No pedal edema. Gastrointestinal system: Abdomen is nondistended, soft and nontender. No organomegaly or masses felt. Normal bowel sounds heard. Central nervous system: Alert and oriented. No focal neurological deficits.     Data Reviewed: I have personally reviewed following labs and imaging studies  CBC:  Recent Labs Lab 02/17/16 1509 02/17/16 1519 02/18/16 0328 02/19/16 0459 02/20/16 0455  WBC 9.0  --  9.0 5.8 7.2  NEUTROABS 6.0  --   --  3.2  --   HGB 14.6 16.0* 12.1 12.1 12.5  HCT 45.2 47.0* 39.0 38.3 38.3  MCV 93.4  --  93.5 92.7 92.3  PLT 217  --  178 167 [id number]   Basic Metabolic Panel:  Recent Labs Lab 02/17/16 1509 02/17/16 1519 02/18/16 0328 02/19/16 0459 02/20/16 0455 02/21/16 0520  NA 141 144 140 140 137 136  K 4.7 4.7 3.6 3.1* 3.7 3.0*  CL 110 108 111 107 107 103  CO2 23  --  22 24 25 23   GLUCOSE 103* 103* 122* 117* 107* 152*  BUN 16 19 10 10 16  26*  CREATININE 1.20* 1.20* 0.99 0.88 0.82 0.75  CALCIUM 9.5  --  8.6* 8.6* 8.7* 8.7*  MG  --   --   --  1.5* 1.8 2.0  PHOS  --   --   --  4.0 4.3 4.3   GFR: Estimated Creatinine Clearance: 46.6 mL/min (by C-G formula based on Cr of 0.75). Liver Function  Tests:  Recent Labs Lab 02/17/16 1509 02/19/16 0459  AST 22 20  ALT 21 14  ALKPHOS 90 68  BILITOT 0.9 0.6  PROT 6.9 5.8*  ALBUMIN 3.6 2.8*    Recent Labs Lab 02/17/16 1509  LIPASE 48   No results for input(s): AMMONIA in the last 168 hours. Coagulation Profile:  Recent Labs Lab 02/17/16 1509 02/18/16 0328 02/19/16 0459 02/20/16 0455 02/21/16 0520  INR 3.00* 3.80* 2.93* 1.80* 1.40   Cardiac Enzymes: No results for input(s): CKTOTAL, CKMB, CKMBINDEX, TROPONINI in the last 168 hours. BNP (last 3 results) No results for input(s): PROBNP in the last 8760 hours. HbA1C: No results for input(s): HGBA1C in the last 72 hours. CBG:  Recent Labs Lab 02/20/16 2029 02/21/16 0007 02/21/16 0320 02/21/16 0807 02/21/16 1147  GLUCAP 112* 123* 143* 163* 136*   Lipid Profile:  Recent Labs  02/19/16 0459  TRIG 181*   Thyroid Function Tests: No results for input(s): TSH, T4TOTAL, FREET4, T3FREE, THYROIDAB in the last 72 hours. Anemia Panel: No results for input(s): VITAMINB12, FOLATE, FERRITIN, TIBC, IRON, RETICCTPCT in the last 72 hours. Urine analysis:    Component Value Date/Time   COLORURINE YELLOW 02/04/2016 0349   APPEARANCEUR CLOUDY* 02/04/2016 0349   LABSPEC 1.023 02/04/2016 0349   PHURINE 5.5 02/04/2016 0349   GLUCOSEU NEGATIVE 02/04/2016 0349   HGBUR MODERATE* 02/04/2016 0349   BILIRUBINUR NEGATIVE 02/04/2016 0349   KETONESUR NEGATIVE 02/04/2016 0349   PROTEINUR 30* 02/04/2016 0349   UROBILINOGEN 0.2 07/22/2014 1120   NITRITE POSITIVE* 02/04/2016 0349   LEUKOCYTESUR MODERATE* 02/04/2016 0349     )No results found for this or any previous visit (from the past 240 hour(s)).    Anti-infectives    Start     Dose/Rate Route Frequency Ordered Stop   02/18/16 1000  fluconazole (DIFLUCAN) IVPB 200 mg     200 mg 100 mL/hr over 60 Minutes Intravenous Every 24 hours 02/18/16 0917     02/17/16 2200  erythromycin 250 mg in sodium chloride 0.9 % 100 mL IVPB   Status:  Discontinued     250 mg 100 mL/hr over 60 Minutes Intravenous Every 12 hours 02/17/16 1720 02/17/16 1853   02/17/16 2100  erythromycin 250 mg in sodium chloride 0.9 % 100 mL IVPB  Status:  Discontinued     250 mg 100 mL/hr over 60 Minutes Intravenous Every 12 hours 02/17/16 1853 02/20/16 0737       Radiology Studies: Dg Hip Unilat With Pelvis 2-3 Views Right  02/21/2016  CLINICAL DATA:  Right hip pain.  Renal cell cancer. EXAM: DG HIP (WITH OR WITHOUT PELVIS) 2-3V RIGHT COMPARISON:  02/17/2016 CT abdomen/pelvis. FINDINGS: Status post right total hip arthroplasty, with no evidence of hardware fracture or loosening. No right hip dislocation. Diffuse osteopenia. No osseous fracture or suspicious focal osseous lesion. Bilateral deep pelvic surgical clips. Degenerative changes in the visualized lower lumbar spine. IMPRESSION: No osseous fracture. No right hip dislocation. Right total hip arthroplasty, with no acute hardware complication. Diffuse osteopenia. Electronically Signed   By: Ilona Sorrel M.D.   On: 02/21/2016 12:19        Scheduled Meds: . antiseptic oral rinse  7 mL Mouth Rinse BID  . enoxaparin (LOVENOX) injection  1 mg/kg Subcutaneous Q12H  . feeding supplement (PRO-STAT SUGAR FREE 64)  30 mL Oral Daily  . fluconazole (DIFLUCAN) IV  200 mg Intravenous Q24H  . furosemide  20 mg Intravenous Daily  . insulin aspart  0-9 Units Subcutaneous Q4H  . levothyroxine  25 mcg Intravenous Daily  . lidocaine  1 patch Transdermal Q24H  . metoprolol  10 mg Intravenous Q6H  . nystatin  5 mL Oral QID  . pregabalin  75 mg Oral BID   Continuous Infusions: . sodium chloride 10 mL/hr at 02/20/16 2216  . Marland KitchenTPN (CLINIMIX-E) Adult 70 mL/hr at 02/20/16 1737   And  . fat emulsion 240 mL (02/20/16 1737)  . Marland KitchenTPN (CLINIMIX-E) Adult     And  . fat emulsion       LOS: 2 days    Time spent: 25 min    [place], DO [facility] Pager [phone]  If 7PM-7AM, please  contact night-coverage [email or web address] Password [email or web address] 02/21/2016, 2:35 PM

## 2016-02-21 NOTE — Progress Notes (Signed)
Initial Nutrition Assessment  DOCUMENTATION CODES:   Non-severe (moderate) malnutrition in context of chronic illness  INTERVENTION:  TPN per Pharmacy.  48 hour calorie count initiated.---> RD to follow up Monday 6/19 for result.  30 ml Prostat po once daily, each supplement provides 100 kcal and 15 grams of protein.   RD to continue to monitor.   NUTRITION DIAGNOSIS:   Malnutrition related to chronic illness, altered GI function as evidenced by mild depletion of body fat, mild depletion of muscle mass, percent weight loss.; ongoing  GOAL:   Patient will meet greater than or equal to 90% of their needs; met via TPN  MONITOR:   Diet advancement, PO intake, Skin, I & O's, Labs, Weight trends  REASON FOR ASSESSMENT:   Consult New TPN/TNA  ASSESSMENT:   82 y.o. female with a Past Medical History of PAF CAD, tachy/brady syndrome, RLS, GERD, uterine cancer and renal cancer, HTN, HLD, hypothyroidism, gastroparesis, Adenocarcinoma of the stomach s/p partial gastrectomy, ckd, and cva, who presents with nausea vomiting abdominal pain. This seems to be a recurrent issue for patient ever since she underwent partial gastrectomy.    RD consulted for calorie count. Per MD, if esophagitis/nausea and vomiting does not improve, J-tube will be placed. Pt was nausea during time of visit and reports only able to take a few bites of her Jello on her clear liquid tray. Pt currently has Prostat ordered however has been refusing them as she reports she is unable to try them as she is nauseous. Noted pt with zinc allergy. Daughter at bedside is wanting J-tube to be placed. RD to follow up Monday with full calorie count results.  Per Pharmacy note, Continue Clinimix E 5/15 at 70m/hr + IV lipids at 133mhr- this provides 1672 kcal, and 84 grams of protein which meets 100% of needs. MVI and trace elements in TPN  Labs and medications reviewed. Potassium low at 3.0.  Diet Order:  TPN (CLINIMIX-E)  Adult Diet clear liquid Room service appropriate?: Yes; Fluid consistency:: Thin TPN (CLINIMIX-E) Adult  Skin:  Wound (see comment) (Stage 1 pressure ulcer on sacrum)  Last BM:  6/17  Height:   Ht Readings from Last 1 Encounters:  02/17/16 '5\' 1"'$  (1.549 m)    Weight:   Wt Readings from Last 1 Encounters:  02/21/16 131 lb 9.8 oz (59.7 kg)    Ideal Body Weight:  47.7 kg  BMI:  Body mass index is 24.88 kg/(m^2).  Estimated Nutritional Needs:   Kcal:  1500-1700  Protein:  75-90 grams  Fluid:  1.5-1.7 L/day  EDUCATION NEEDS:   No education needs identified at this time  StCorrin ParkerMS, RD, LDN Pager # [phone] hours/ weekend pager # [phone]

## 2016-02-21 NOTE — Progress Notes (Signed)
[place] NOTE  Pharmacy Consult for TPN Indication: intolerance to enteral feeds  Allergies  Allergen Reactions  . Amiodarone Other (See Comments)     Thyroid and liver and kidney problems  . Codeine Nausea And Vomiting  . Gabapentin Itching  . Hydrocodone Other (See Comments)    hallucination  . Morphine And Related Nausea And Vomiting  . Requip [Ropinirole Hcl]     Headache   . Zinc Nausea Only    nausea  . Reglan [Metoclopramide] Hives, Itching and Rash    Made face red, also   Patient Measurements: Height: 5\' 1"  (154.9 cm) Weight: 131 lb 9.8 oz (59.7 kg) (scale A) IBW/kg (Calculated) : 47.8  Vital Signs: Temp: 98 F (36.7 C) (06/17 0446) Temp Source: Oral (06/17 0446) BP: 109/63 mmHg (06/17 0446) Pulse Rate: 97 (06/17 0446) Intake/Output from previous day: 06/16 0701 - 06/17 0700 In: 11495.2 [P.O.:470; I.V.:7915; IV Piggyback:150; TPN:2960.2] Out: 2552 [Urine:2550; Stool:2] Intake/Output from this shift: Total I/O In: 180 [I.V.:20; TPN:160] Out: 150 [Urine:150]  Labs:  Recent Labs  02/19/16 0459 02/20/16 0455 02/21/16 0520  WBC 5.8 7.2  --   HGB 12.1 12.5  --   HCT 38.3 38.3  --   PLT 167 169  --   INR 2.93* 1.80* 1.40    Recent Labs  02/19/16 0459 02/20/16 0455 02/21/16 0520  NA 140 137 136  K 3.1* 3.7 3.0*  CL 107 107 103  CO2 24 25 23   GLUCOSE 117* 107* 152*  BUN 10 16 26*  CREATININE 0.88 0.82 0.75  CALCIUM 8.6* 8.7* 8.7*  MG 1.5* 1.8 2.0  PHOS 4.0 4.3 4.3  PROT 5.8*  --   --   ALBUMIN 2.8*  --   --   AST 20  --   --   ALT 14  --   --   ALKPHOS 68  --   --   BILITOT 0.6  --   --   PREALBUMIN 19.7  --   --   TRIG 181*  --   --    Estimated Creatinine Clearance: 46.6 mL/min (by C-G formula based on Cr of 0.75).    Recent Labs  02/21/16 0007 02/21/16 0320 02/21/16 0807  GLUCAP 123* 143* 163*   Insulin Requirements in the past 24 hours:  3 units SSI  Admit: 39 YOF with history of kidney and gastric  cancer, s/p proximal gastrectomy in early May. She was readmitted earlier in June with unretractable N/V and was on TPN at that time. This was discontinued prior to her going home- confirmed with Wynona Luna from [place].   Surgeries/Procedures: nothing this admission.  GI: GERD / anal fissure / gastric cancer. EGD during last admission showed esophagitis, gastric ulcer at gastric anastomosis She was to be on liquids only for 2 weeks at home.  On erythromycin for gastroparesis. Having loose stools. Having N/V even with ice chips. Prealbumin 19.7. Dr. Marlowe Aschoff note states if patient's esophagitis does not improve, a Jtube will be placed for feeding next week. May increase diet to clears today if she can tolerate  Endo: hypothyroid on Synthroid - changed to IV (dose ok). No hx DM -  CBGs 112-143  Lytes: K 3.0, Mag 2.0, Phos 4.3, CorCa ~9.4  Renal: SCr 0.82, CrCl ~40-46mL/min- improved, now stable  Pulm: 98/RA  Cards: PAF / CAD / AS / tachy-brady syndrome post PPM / HTN / HLD / CHF, Afib. VSS- Lopressor, diltiazem, LMWH  Hepatobil:  LFTs wnl, albumin low at 2.8  Neuro: neuropathy / RLS / migraine / anxiety / hx CVA. - Pregabalin on board  ID: Pressure ulcer identified - esophagitis - Fluconazole, Nystatin except Erythromycin solution - afebrile, WBC wnl  Best Practices: Lovenox (holding warfarin), MC  TPN Access: PICC placed 6/14 TPN start date: 6/14>>  Current Nutrition:  -Clinimix E 5/15 at 32mL/hr + IV lipids at 80mL/hr- this provides 84g protein and 1672kcal which meets 100% of needs -Prostat 31mL daily- provides 15g protein and 72kcal - now discontinued  Nutritional Goals: (per RD recommendations 6/14) 1500-1700 kCal, 75-90 grams of protein per day  Plan:  -magnesium sulfate 2g IV for repletion -Continue Clinimix E 5/15 at 64mL/hr + IV lipids at 47mL/hr- this provides 84g protein and 1672kcal which meets 100% of needs -MVI and trace elements in TPN -Pepcid 40mg  in  TPN -Continue sensitive SSI and CBGs q4h - Give 3 runs of potassium 71meq - Check AM labs  Rober Minion, PharmD., MS Clinical Pharmacist Pager:  [phone] Thank you for allowing pharmacy to be part of this patients care team. 02/21/2016 10:06 AM

## 2016-02-22 LAB — BASIC METABOLIC PANEL
ANION GAP: 6 (ref 5–15)
BUN: 27 mg/dL — ABNORMAL HIGH (ref 6–20)
CO2: 25 mmol/L (ref 22–32)
Calcium: 8.6 mg/dL — ABNORMAL LOW (ref 8.9–10.3)
Chloride: 102 mmol/L (ref 101–111)
Creatinine, Ser: 0.75 mg/dL (ref 0.44–1.00)
GLUCOSE: 143 mg/dL — AB (ref 65–99)
POTASSIUM: 3.8 mmol/L (ref 3.5–5.1)
Sodium: 133 mmol/L — ABNORMAL LOW (ref 135–145)

## 2016-02-22 LAB — GLUCOSE, CAPILLARY
GLUCOSE-CAPILLARY: 131 mg/dL — AB (ref 65–99)
GLUCOSE-CAPILLARY: 141 mg/dL — AB (ref 65–99)
GLUCOSE-CAPILLARY: 115 mg/dL — AB (ref 65–99)
Glucose-Capillary: 128 mg/dL — ABNORMAL HIGH (ref 65–99)
Glucose-Capillary: 140 mg/dL — ABNORMAL HIGH (ref 65–99)

## 2016-02-22 LAB — CBC
HEMATOCRIT: 35.5 % — AB (ref 36.0–46.0)
HEMOGLOBIN: 11.5 g/dL — AB (ref 12.0–15.0)
MCH: 29.8 pg (ref 26.0–34.0)
MCHC: 32.4 g/dL (ref 30.0–36.0)
MCV: 92 fL (ref 78.0–100.0)
Platelets: 178 10*3/uL (ref 150–400)
RBC: 3.86 MIL/uL — AB (ref 3.87–5.11)
RDW: 16.2 % — ABNORMAL HIGH (ref 11.5–15.5)
WBC: 10.5 10*3/uL (ref 4.0–10.5)

## 2016-02-22 LAB — PROTIME-INR
INR: 1.38 (ref 0.00–1.49)
Prothrombin Time: 17.1 seconds — ABNORMAL HIGH (ref 11.6–15.2)

## 2016-02-22 MED ORDER — DEXTROSE 5 % IV SOLN
500.0000 mg | Freq: Four times a day (QID) | INTRAVENOUS | Status: DC | PRN
  Administered 2016-02-22 – 2016-03-08 (×24): 500 mg via INTRAVENOUS
  Filled 2016-02-22 (×36): qty 5

## 2016-02-22 MED ORDER — FAT EMULSION 20 % IV EMUL
240.0000 mL | INTRAVENOUS | Status: AC
  Administered 2016-02-22: 240 mL via INTRAVENOUS
  Filled 2016-02-22: qty 250

## 2016-02-22 MED ORDER — TRACE MINERALS CR-CU-MN-SE-ZN 10-1000-500-60 MCG/ML IV SOLN
INTRAVENOUS | Status: AC
  Administered 2016-02-22: 18:00:00 via INTRAVENOUS
  Filled 2016-02-22: qty 1680

## 2016-02-22 MED ORDER — LEVOTHYROXINE SODIUM 50 MCG PO TABS
50.0000 ug | ORAL_TABLET | Freq: Every day | ORAL | Status: DC
  Administered 2016-02-23 – 2016-02-28 (×6): 50 ug via ORAL
  Filled 2016-02-22 (×7): qty 1

## 2016-02-22 MED ORDER — FENTANYL CITRATE (PF) 100 MCG/2ML IJ SOLN: 12.5000 ug | INTRAMUSCULAR | Status: DC | PRN

## 2016-02-22 NOTE — Progress Notes (Signed)
[place] NOTE  Pharmacy Consult for TPN Indication: intolerance to enteral feeds  Allergies  Allergen Reactions  . Amiodarone Other (See Comments)     Thyroid and liver and kidney problems  . Codeine Nausea And Vomiting  . Gabapentin Itching  . Hydrocodone Other (See Comments)    hallucination  . Morphine And Related Nausea And Vomiting  . Requip [Ropinirole Hcl]     Headache   . Zinc Nausea Only    nausea  . Reglan [Metoclopramide] Hives, Itching and Rash    Made face red, also   Patient Measurements: Height: 5\' 1"  (154.9 cm) Weight: 132 lb 4.8 oz (60.011 kg) (scale a) IBW/kg (Calculated) : 47.8  Vital Signs: Temp: 98.1 F (36.7 C) (06/18 0543) Temp Source: Oral (06/18 0543) BP: 119/74 mmHg (06/18 0543) Pulse Rate: 111 (06/18 0543) Intake/Output from previous day: 06/17 0701 - 06/18 0700 In: 2288.8 [P.O.:120; I.V.:230; IV Piggyback:100; TPN:1838.8] Out: 1575 [Urine:1575] Intake/Output from this shift: Total I/O In: -  Out: 200 [Urine:200]  Labs:  Recent Labs  02/20/16 0455 02/21/16 0520 02/22/16 0359  WBC 7.2  --  10.5  HGB 12.5  --  11.5*  HCT 38.3  --  35.5*  PLT 169  --  178  INR 1.80* 1.40 1.38    Recent Labs  02/20/16 0455 02/21/16 0520 02/22/16 0359  NA 137 136 133*  K 3.7 3.0* 3.8  CL 107 103 102  CO2 25 23 25   GLUCOSE 107* 152* 143*  BUN 16 26* 27*  CREATININE 0.82 0.75 0.75  CALCIUM 8.7* 8.7* 8.6*  MG 1.8 2.0  --   PHOS 4.3 4.3  --    Estimated Creatinine Clearance: 46.7 mL/min (by C-G formula based on Cr of 0.75).    Recent Labs  02/21/16 2328 02/22/16 0346 02/22/16 0755  GLUCAP 158* 131* 141*   Insulin Requirements in the past 24 hours:  7 units SSI  Admit: 56 YOF with history of kidney and gastric cancer, s/p proximal gastrectomy in early May. She was readmitted earlier in June with unretractable N/V and was on TPN at that time. This was discontinued prior to her going home- confirmed with Wynona Luna from [place].   Surgeries/Procedures: nothing this admission.  GI: GERD / anal fissure / gastric cancer. EGD during last admission showed esophagitis, gastric ulcer at gastric anastomosis She was to be on liquids only for 2 weeks at home.  On erythromycin for gastroparesis. Having loose stools. Having N/V even with ice chips. Prealbumin 19.7. Dr. Marlowe Aschoff note states if patient's esophagitis does not improve, a Jtube will be placed for feeding next week. May increase diet to clears today if she can tolerate - had 159ml oral intake  Endo: hypothyroid on Synthroid - changed to IV (dose ok). No hx DM -  CBGs 136-158 - controlled  Lytes: K 3.8 after 3 runs K+ 6/17, (Mag 2.0, Phos 4.3 - 6/7), CorCa ~9.4  Renal: SCr 0.75, CrCl ~40-16mL/min- improved, now stable  Pulm: 98/RA  Cards: PAF / CAD / AS / tachy-brady syndrome post PPM / HTN / HLD / CHF, Afib. VSS- Lopressor, diltiazem, LMWH  Hepatobil: LFTs wnl, albumin low at 2.8  Neuro: neuropathy / RLS / migraine / anxiety / hx CVA. - Pregabalin on board  ID: Pressure ulcer identified - esophagitis - Fluconazole, Nystatin and Erythromycin solution - afebrile, WBC wnl  Best Practices: Lovenox (holding warfarin), MC  TPN Access: PICC placed 6/14 TPN start date: 6/14>>  Current Nutrition:  -Clinimix E 5/15 at 62mL/hr + IV lipids at 30mL/hr- this provides 84g protein and 1672kcal which meets 100% of needs -Prostat 76mL daily- provides 15g protein and 72kcal - now discontinued  Nutritional Goals: (per RD recommendations 6/14) 1500-1700 kCal, 75-90 grams of protein per day  Plan:  -magnesium sulfate 2g IV for repletion -Continue Clinimix E 5/15 at 31mL/hr + IV lipids at 43mL/hr- this provides 84g protein and 1672kcal which meets 100% of needs - f/u calorie count with RD on Monday -MVI and trace elements in TPN -Pepcid 40mg  in TPN -Continue sensitive SSI and CBGs q4h - Check AM labs  Rober Minion, PharmD., MS Clinical  Pharmacist Pager:  [phone] Thank you for allowing pharmacy to be part of this patients care team. 02/22/2016 8:53 AM

## 2016-02-22 NOTE — Progress Notes (Signed)
PROGRESS NOTE    [name]  [id number] DOB: Dec 14, 1933 DOA: 02/17/2016 PCP: [name], MD   Outpatient Specialists:  [name]   Brief Narrative:  [name] is a 82 y.o. female with medical history significant for but not limited to, atrial fibrillation, CKD, HTN, kidney cancer and gastric cancer. She is status post proximal gastrectomy early May for gastric adenocarcinoma. She was discharged home late May on twice daily PPI and Carafate. She was tolerating soft diet. Patient was readmitted several days later for nausea and vomiting. During that admission she underwent upper endoscopy with findings of grade D esophagitis and 2 small gastric ulcers at the anastomosis. She was given TNA during that admission.. Patient was discharged home 02/13/16 on twice a day PPI and metoclopramide but was unable to tolerate metoclopramide secondary to hives so instead took Maalox. Patient was compliant at home with twice daily PPI and antireflux measures but this am patient began vomiting again. She had been "spitting up" but that turned to vomiting this am. She has had burning discomfort in her throat and chest since last admission. No abdominal pain. Stool chronically loose and unchanged.    Assessment & Plan:   Principal Problem:   Intractable nausea and vomiting Active Problems:   Hypothyroidism   Atrial fibrillation (HCC)   Cardiac pacemaker (06/2010)   Essential hypertension   Peripheral nerve disease (HCC)   Adenocarcinoma of stomach (HCC)   Dehydration   Gastroesophageal reflux disease with esophagitis   AKI (acute kidney injury) ([place])   S/P partial gastrectomy   Nausea & vomiting   Nausea and vomiting   Recurrent nausea and vomiting / dehydration . - had a subtotal gastrectomy early May for gastric cancer  -Readmitted earlier this month with nausea and vomiting  -Upper endoscopy 02/05/16 with findings of severe esophagitis and 2 nonbleeding small  anastomotic ulcers.  -CT scan today suggests wall thickening of the distal esophagus and adjacent stomach at the gastroesophageal anastomosised inflammation. No other findings, no evidence for obstruction. Etiology of recurrent vomiting unclear at this point.  -IV fluids -Given national shortage of IV Protonix will give IV Pepcid twice daily -Anti-emetics -d/c IV erythromycin-- watch for symptoms PICC for TPN-- if not improved, will need PEG tube- will start calorie count--- from what she took in this AM doubt she can ingest enough calories to sustain herself -treat with diflucan for presumed thrush  Gastric adenocarcinoma, diagnosed in March. Patient is status post subtotal gastrectomy early May. No evidence for metastasis  GERD/ severe esophagitis on recent EGD -Twice a day IV H2 blocker (national shortage of IV PPI) -Anti-reflux measures  Paroxysmal Atrial fib. Chadsvasc 5. On warfarin , INR 3 . Presenting heart rate 122, suspect this was secondary to volume depletion related to vomiting. Heart rate now 90 -IV metoprolol -Monitor on telemetry -lovenox when INR < 2- holding coumadin as may need procedure (J tube)  Acute kidney injury. Resolved  Peripheral nerve disease.  -Fentanyl prn  Hypothyroidism. -change to IV-- 1/2 dose  Tachycardia bradycardia syndrome, status post dual chamber pacemaker-  Hypertension, controlled.  -IV  History of kidney cancer, status post nephrectomy  Thrush -diflucan -nystatin  Hypokalemia -replete in TPN  Right hip pain lidocaine patch -xray shows osteopenia  DVT prophylaxis:  Warfarin-- change to lovenox when INR < 2  Code Status: DNR   Family Communication: Daughter at bedside  Disposition Plan:     Consultants:   [name]  Procedures:  Antimicrobials:       Subjective: Still with hip pain that makes her nauseaous   Objective: Filed Vitals:   02/22/16 0543 02/22/16 0605 02/22/16 1011 02/22/16 1209   BP: 119/74  129/83 117/72  Pulse: 111  90 98  Temp: 98.1 F (36.7 C)   97.8 F (36.6 C)  TempSrc: Oral   Oral  Resp: 18   18  Height:      Weight:  60.011 kg (132 lb 4.8 oz)    SpO2: 98%   99%    Intake/Output Summary (Last 24 hours) at 02/22/16 1212 Last data filed at 02/22/16 1201  Gross per 24 hour  Intake 2058.83 ml  Output   1925 ml  Net 133.83 ml   Filed Weights   02/20/16 0424 02/21/16 0806 02/22/16 0605  Weight: 60.419 kg (133 lb 3.2 oz) 59.7 kg (131 lb 9.8 oz) 60.011 kg (132 lb 4.8 oz)    Examination:  General exam: Appears calm and comfortable  Respiratory system: Clear to auscultation. Respiratory effort normal. Cardiovascular system: S1 & S2 heard, RRR. No JVD, murmurs, rubs, gallops or clicks. No pedal edema. Gastrointestinal system: Abdomen is nondistended, soft and nontender. No organomegaly or masses felt. Normal bowel sounds heard. Central nervous system: Alert and oriented. No focal neurological deficits.     Data Reviewed: I have personally reviewed following labs and imaging studies  CBC:  Recent Labs Lab 02/17/16 1509 02/17/16 1519 02/18/16 0328 02/19/16 0459 02/20/16 0455 02/22/16 0359  WBC 9.0  --  9.0 5.8 7.2 10.5  NEUTROABS 6.0  --   --  3.2  --   --   HGB 14.6 16.0* 12.1 12.1 12.5 11.5*  HCT 45.2 47.0* 39.0 38.3 38.3 35.5*  MCV 93.4  --  93.5 92.7 92.3 92.0  PLT 217  --  178 167 169 0000000   Basic Metabolic Panel:  Recent Labs Lab 02/18/16 0328 02/19/16 0459 02/20/16 0455 02/21/16 0520 02/22/16 0359  NA 140 140 137 136 133*  K 3.6 3.1* 3.7 3.0* 3.8  CL 111 107 107 103 102  CO2 22 24 25 23 25   GLUCOSE 122* 117* 107* 152* 143*  BUN 10 10 16  26* 27*  CREATININE 0.99 0.88 0.82 0.75 0.75  CALCIUM 8.6* 8.6* 8.7* 8.7* 8.6*  MG  --  1.5* 1.8 2.0  --   PHOS  --  4.0 4.3 4.3  --    GFR: Estimated Creatinine Clearance: 46.7 mL/min (by C-G formula based on Cr of 0.75). Liver Function Tests:  Recent Labs Lab 02/17/16 1509  02/19/16 0459  AST 22 20  ALT 21 14  ALKPHOS 90 68  BILITOT 0.9 0.6  PROT 6.9 5.8*  ALBUMIN 3.6 2.8*    Recent Labs Lab 02/17/16 1509  LIPASE 48   No results for input(s): AMMONIA in the last 168 hours. Coagulation Profile:  Recent Labs Lab 02/18/16 0328 02/19/16 0459 02/20/16 0455 02/21/16 0520 02/22/16 0359  INR 3.80* 2.93* 1.80* 1.40 1.38   Cardiac Enzymes: No results for input(s): CKTOTAL, CKMB, CKMBINDEX, TROPONINI in the last 168 hours. BNP (last 3 results) No results for input(s): PROBNP in the last 8760 hours. HbA1C: No results for input(s): HGBA1C in the last 72 hours. CBG:  Recent Labs Lab 02/21/16 1928 02/21/16 2328 02/22/16 0346 02/22/16 0755 02/22/16 1200  GLUCAP 138* 158* 131* 141* 128*   Lipid Profile: No results for input(s): CHOL, HDL, LDLCALC, TRIG, CHOLHDL, LDLDIRECT in the last 72 hours. Thyroid Function Tests: No  results for input(s): TSH, T4TOTAL, FREET4, T3FREE, THYROIDAB in the last 72 hours. Anemia Panel: No results for input(s): VITAMINB12, FOLATE, FERRITIN, TIBC, IRON, RETICCTPCT in the last 72 hours. Urine analysis:    Component Value Date/Time   COLORURINE YELLOW 02/04/2016 0349   APPEARANCEUR CLOUDY* 02/04/2016 0349   LABSPEC 1.023 02/04/2016 0349   PHURINE 5.5 02/04/2016 0349   GLUCOSEU NEGATIVE 02/04/2016 0349   HGBUR MODERATE* 02/04/2016 0349   BILIRUBINUR NEGATIVE 02/04/2016 0349   KETONESUR NEGATIVE 02/04/2016 0349   PROTEINUR 30* 02/04/2016 0349   UROBILINOGEN 0.2 07/22/2014 1120   NITRITE POSITIVE* 02/04/2016 0349   LEUKOCYTESUR MODERATE* 02/04/2016 0349     )No results found for this or any previous visit (from the past 240 hour(s)).    Anti-infectives    Start     Dose/Rate Route Frequency Ordered Stop   02/18/16 1000  fluconazole (DIFLUCAN) IVPB 200 mg     200 mg 100 mL/hr over 60 Minutes Intravenous Every 24 hours 02/18/16 0917     02/17/16 2200  erythromycin 250 mg in sodium chloride 0.9 % 100 mL  IVPB  Status:  Discontinued     250 mg 100 mL/hr over 60 Minutes Intravenous Every 12 hours 02/17/16 1720 02/17/16 1853   02/17/16 2100  erythromycin 250 mg in sodium chloride 0.9 % 100 mL IVPB  Status:  Discontinued     250 mg 100 mL/hr over 60 Minutes Intravenous Every 12 hours 02/17/16 1853 02/20/16 0737       Radiology Studies: Dg Hip Unilat With Pelvis 2-3 Views Right  02/21/2016  CLINICAL DATA:  Right hip pain.  Renal cell cancer. EXAM: DG HIP (WITH OR WITHOUT PELVIS) 2-3V RIGHT COMPARISON:  02/17/2016 CT abdomen/pelvis. FINDINGS: Status post right total hip arthroplasty, with no evidence of hardware fracture or loosening. No right hip dislocation. Diffuse osteopenia. No osseous fracture or suspicious focal osseous lesion. Bilateral deep pelvic surgical clips. Degenerative changes in the visualized lower lumbar spine. IMPRESSION: No osseous fracture. No right hip dislocation. Right total hip arthroplasty, with no acute hardware complication. Diffuse osteopenia. Electronically Signed   By: Ilona Sorrel M.D.   On: 02/21/2016 12:19        Scheduled Meds: . antiseptic oral rinse  7 mL Mouth Rinse BID  . enoxaparin (LOVENOX) injection  1 mg/kg Subcutaneous Q12H  . feeding supplement (PRO-STAT SUGAR FREE 64)  30 mL Oral Daily  . fluconazole (DIFLUCAN) IV  200 mg Intravenous Q24H  . furosemide  20 mg Intravenous Daily  . insulin aspart  0-9 Units Subcutaneous Q4H  . levothyroxine  25 mcg Intravenous Daily  . lidocaine  1 patch Transdermal Q24H  . metoprolol  10 mg Intravenous Q6H  . nystatin  5 mL Oral QID  . pregabalin  75 mg Oral BID   Continuous Infusions: . sodium chloride 10 mL/hr at 02/20/16 2216  . Marland KitchenTPN (CLINIMIX-E) Adult 70 mL/hr at 02/21/16 1818   And  . fat emulsion 240 mL (02/21/16 1817)  . Marland KitchenTPN (CLINIMIX-E) Adult     And  . fat emulsion       LOS: 3 days    Time spent: 25 min    [place], DO [facility] Pager [phone]  If 7PM-7AM,  please contact night-coverage [email or web address] Password [email or web address] 02/22/2016, 12:12 PM

## 2016-02-23 DIAGNOSIS — B370 Candidal stomatitis: Secondary | ICD-10-CM

## 2016-02-23 LAB — COMPREHENSIVE METABOLIC PANEL
ALK PHOS: 73 U/L (ref 38–126)
ALT: 13 U/L — ABNORMAL LOW (ref 14–54)
ANION GAP: 9 (ref 5–15)
AST: 21 U/L (ref 15–41)
Albumin: 2.6 g/dL — ABNORMAL LOW (ref 3.5–5.0)
BILIRUBIN TOTAL: 0.8 mg/dL (ref 0.3–1.2)
BUN: 25 mg/dL — ABNORMAL HIGH (ref 6–20)
CALCIUM: 8.4 mg/dL — AB (ref 8.9–10.3)
CO2: 20 mmol/L — AB (ref 22–32)
Chloride: 104 mmol/L (ref 101–111)
Creatinine, Ser: 0.72 mg/dL (ref 0.44–1.00)
GFR calc non Af Amer: >60 mL/min (ref >60)
Glucose, Bld: 120 mg/dL — ABNORMAL HIGH (ref 65–99)
Potassium: 3.7 mmol/L (ref 3.5–5.1)
SODIUM: 133 mmol/L — AB (ref 135–145)
TOTAL PROTEIN: 5.5 g/dL — AB (ref 6.5–8.1)

## 2016-02-23 LAB — GLUCOSE, CAPILLARY
GLUCOSE-CAPILLARY: 121 mg/dL — AB (ref 65–99)
GLUCOSE-CAPILLARY: 132 mg/dL — AB (ref 65–99)
Glucose-Capillary: 101 mg/dL — ABNORMAL HIGH (ref 65–99)
Glucose-Capillary: 112 mg/dL — ABNORMAL HIGH (ref 65–99)
Glucose-Capillary: 129 mg/dL — ABNORMAL HIGH (ref 65–99)
Glucose-Capillary: 133 mg/dL — ABNORMAL HIGH (ref 65–99)
Glucose-Capillary: 144 mg/dL — ABNORMAL HIGH (ref 65–99)

## 2016-02-23 LAB — CBC
HCT: 33.1 % — ABNORMAL LOW (ref 36.0–46.0)
Hemoglobin: 10.5 g/dL — ABNORMAL LOW (ref 12.0–15.0)
MCH: 29.4 pg (ref 26.0–34.0)
MCHC: 31.7 g/dL (ref 30.0–36.0)
MCV: 92.7 fL (ref 78.0–100.0)
PLATELETS: 165 10*3/uL (ref 150–400)
RBC: 3.57 MIL/uL — ABNORMAL LOW (ref 3.87–5.11)
RDW: 16.2 % — AB (ref 11.5–15.5)
WBC: 10.3 10*3/uL (ref 4.0–10.5)

## 2016-02-23 LAB — PHOSPHORUS: PHOSPHORUS: 3.3 mg/dL (ref 2.5–4.6)

## 2016-02-23 LAB — PREALBUMIN: Prealbumin: 12.9 mg/dL — ABNORMAL LOW (ref 18–38)

## 2016-02-23 LAB — PROTIME-INR
INR: 1.31 (ref 0.00–1.49)
Prothrombin Time: 16.4 seconds — ABNORMAL HIGH (ref 11.6–15.2)

## 2016-02-23 LAB — DIFFERENTIAL
BASOS ABS: 0 10*3/uL (ref 0.0–0.1)
BASOS PCT: 0 %
Eosinophils Absolute: 0.3 10*3/uL (ref 0.0–0.7)
Eosinophils Relative: 3 %
Lymphocytes Relative: 18 %
Lymphs Abs: 1.8 10*3/uL (ref 0.7–4.0)
Monocytes Absolute: 1.1 10*3/uL — ABNORMAL HIGH (ref 0.1–1.0)
Monocytes Relative: 10 %
NEUTROS ABS: 7.1 10*3/uL (ref 1.7–7.7)
Neutrophils Relative %: 69 %

## 2016-02-23 LAB — TRIGLYCERIDES: Triglycerides: 76 mg/dL (ref <150)

## 2016-02-23 LAB — MAGNESIUM: Magnesium: 1.7 mg/dL (ref 1.7–2.4)

## 2016-02-23 MED ORDER — FAT EMULSION 20 % IV EMUL
240.0000 mL | INTRAVENOUS | Status: AC
  Administered 2016-02-23: 240 mL via INTRAVENOUS
  Filled 2016-02-23: qty 250

## 2016-02-23 MED ORDER — CLINIMIX E/DEXTROSE (5/15) 5 % IV SOLN
INTRAVENOUS | Status: AC
  Administered 2016-02-23: 19:00:00 via INTRAVENOUS
  Filled 2016-02-23: qty 1680

## 2016-02-23 NOTE — Progress Notes (Signed)
Patient ID: [name], female   DOB: [birth year], 82 y.o.   MRN: [id number]    Subjective: Tolerating pills now, but still poor PO intake.     Objective: Vital signs in last 24 hours: Temp:  [97.8 F (36.6 C)-97.9 F (36.6 C)] 97.9 F (36.6 C) (06/19 0415) Pulse Rate:  [90-106] 106 (06/19 0415) Resp:  [18] 18 (06/19 0415) BP: (117-129)/(66-83) 126/66 mmHg (06/19 0415) SpO2:  [97 %-99 %] 97 % (06/19 0415) Weight:  [60.691 kg (133 lb 12.8 oz)] 60.691 kg (133 lb 12.8 oz) (06/19 0545) Last BM Date: 02/22/16  Intake/Output from previous day: 06/18 0701 - 06/19 0700 In: 1198.2 [P.O.:120; IV Piggyback:110; TPN:968.2] Out: 2525 [Urine:2525] Intake/Output this shift:    General appearance: looks weak Resp: breathing comfortably GI: soft, non tender, non distended  Lab Results:   Recent Labs  02/22/16 0359 02/23/16 0410  WBC 10.5 10.3  HGB 11.5* 10.5*  HCT 35.5* 33.1*  PLT 178 165   BMET  Recent Labs  02/22/16 0359 02/23/16 0410  NA 133* 133*  K 3.8 3.7  CL 102 104  CO2 25 20*  GLUCOSE 143* 120*  BUN 27* 25*  CREATININE 0.75 0.72  CALCIUM 8.6* 8.4*   PT/INR  Recent Labs  02/22/16 0359 02/23/16 0410  LABPROT 17.1* 16.4*  INR 1.38 1.31   ABG No results for input(s): PHART, HCO3 in the last 72 hours.  Invalid input(s): PCO2, PO2  Studies/Results: Dg Hip Unilat With Pelvis 2-3 Views Right  02/21/2016  CLINICAL DATA:  Right hip pain.  Renal cell cancer. EXAM: DG HIP (WITH OR WITHOUT PELVIS) 2-3V RIGHT COMPARISON:  02/17/2016 CT abdomen/pelvis. FINDINGS: Status post right total hip arthroplasty, with no evidence of hardware fracture or loosening. No right hip dislocation. Diffuse osteopenia. No osseous fracture or suspicious focal osseous lesion. Bilateral deep pelvic surgical clips. Degenerative changes in the visualized lower lumbar spine. IMPRESSION: No osseous fracture. No right hip dislocation. Right total hip arthroplasty, with no acute hardware  complication. Diffuse osteopenia. Electronically Signed   By: Ilona Sorrel M.D.   On: 02/21/2016 12:19    Anti-infectives: Anti-infectives    Start     Dose/Rate Route Frequency Ordered Stop   02/18/16 1000  fluconazole (DIFLUCAN) IVPB 200 mg     200 mg 100 mL/hr over 60 Minutes Intravenous Every 24 hours 02/18/16 0917     02/17/16 2200  erythromycin 250 mg in sodium chloride 0.9 % 100 mL IVPB  Status:  Discontinued     250 mg 100 mL/hr over 60 Minutes Intravenous Every 12 hours 02/17/16 1720 02/17/16 1853   02/17/16 2100  erythromycin 250 mg in sodium chloride 0.9 % 100 mL IVPB  Status:  Discontinued     250 mg 100 mL/hr over 60 Minutes Intravenous Every 12 hours 02/17/16 1853 02/20/16 0737      Assessment/Plan: s/p * No surgery found * s/p partial gastrectomy 3-4 weeks ago.   Esophagitis-clears.  Meds back to PO.  Protein calorie malnutrition- TNA   Will plan J tube in next few days.    [facility] 02/23/2016

## 2016-02-23 NOTE — Evaluation (Signed)
Physical Therapy Evaluation Patient Details Name: [name] MRN: [id number] DOB: October 31, 1933 Today's Date: 02/23/2016   History of Present Illness  [name] is a 82 y.o. female with medical history significant for but not limited to, atrial fibrillation, CKD, HTN, kidney cancer and gastric cancer. She is status post proximal gastrectomy early May for gastric adenocarcinoma. She was discharged home late May on twice daily PPI and Carafate. She was tolerating soft diet. Patient was readmitted several days later for nausea and vomiting. During that admission she underwent upper endoscopy with findings of grade D esophagitis and 2 small gastric ulcers at the anastomosis. She was given TNA during that admission.. Patient was discharged home 02/13/16 on twice a day PPI and metoclopramide but was unable to tolerate metoclopramide secondary to hives so instead took Maalox. Patient was compliant at home with twice daily PPI and antireflux measures but this am patient began vomiting again. She had been "spitting up" but that turned to vomiting this am. She has had burning discomfort in her throat and chest since last admission. No abdominal pain. Stool chronically loose and unchanged.   Clinical Impression  Pt admitted with above diagnosis. Pt currently with functional limitations due to the deficits listed below (see PT Problem List). Pt was able to sit EOB and stand for a few seconds but then had pain right hip and nausea.  Laid pt back down so she could rest as they had given her Robaxin.  Will follow acutely.  Pt will benefit from skilled PT to increase their independence and safety with mobility to allow discharge to the venue listed below.      Follow Up Recommendations Home health PT;Supervision/Assistance - 24 hour    Equipment Recommendations  None recommended by PT    Recommendations for Other Services       Precautions / Restrictions Precautions Precautions:  Fall Restrictions Weight Bearing Restrictions: No      Mobility  Bed Mobility Overal bed mobility: Needs Assistance Bed Mobility: Supine to Sit     Supine to sit: Mod assist Sit to supine: Mod assist   General bed mobility comments: pulls up on daughter with daughter also assisting with LEs.   Transfers Overall transfer level: Needs assistance Equipment used: Rolling walker (2 wheeled) Transfers: Sit to/from Stand Sit to Stand: Min assist;Min guard         General transfer comment: pt needs min assist for steadying and bil UE support.  Pt with pain in LES therefore couldnt stand longer than 20 seconds.   Ambulation/Gait                Stairs            Wheelchair Mobility    Modified Rankin (Stroke Patients Only)       Balance Overall balance assessment: Needs assistance Sitting-balance support: No upper extremity supported;Feet supported Sitting balance-Leahy Scale: Good     Standing balance support: Bilateral upper extremity supported;During functional activity Standing balance-Leahy Scale: Poor Standing balance comment: relies on UES as right hip began hurting once she stood up.                               Pertinent Vitals/Pain Pain Assessment: Faces Faces Pain Scale: Hurts whole lot Pain Location: right hip Pain Descriptors / Indicators: Aching;Grimacing;Guarding;Throbbing Pain Intervention(s): Limited activity within patient's tolerance;Monitored during session;Premedicated before session;Repositioned (had robaxin prior to treatment)  VSS  Home Living Family/patient expects to be discharged to:: Private residence Living Arrangements: Children Available Help at Discharge: Family;Available 24 hours/day Type of Home: House Home Access: Stairs to enter Entrance Stairs-Rails: Left Entrance Stairs-Number of Steps: 1 Home Layout: Multi-level Home Equipment: Walker - 2 wheels;Cane - quad;Grab bars - toilet;Grab bars -  tub/shower;Wheelchair - Insurance claims handler - 4 wheels;Bedside commode      Prior Function Level of Independence: Independent with assistive device(s)         Comments: ambulating with walker and even quad cane when feeling good     Hand Dominance   Dominant Hand: Right    Extremity/Trunk Assessment   Upper Extremity Assessment: Defer to OT evaluation           Lower Extremity Assessment: RLE deficits/detail RLE Deficits / Details: h/o multiple surgeries on R leg after TKA and subsequent infection and tendon repair, limited AROM at knee strength 3-4/5 throughout (with extension lag)    Cervical / Trunk Assessment: Kyphotic  Communication   Communication: HOH  Cognition Arousal/Alertness: Awake/alert Behavior During Therapy: WFL for tasks assessed/performed Overall Cognitive Status: Within Functional Limits for tasks assessed                      General Comments      Exercises        Assessment/Plan    PT Assessment Patient needs continued PT services  PT Diagnosis Generalized weakness;Difficulty walking   PT Problem List Decreased strength;Decreased activity tolerance;Decreased balance;Decreased mobility  PT Treatment Interventions DME instruction;Gait training;Balance training;Functional mobility training;Patient/family education;Therapeutic activities;Therapeutic exercise   PT Goals (Current goals can be found in the Care Plan section) Acute Rehab PT Goals Patient Stated Goal: go home PT Goal Formulation: With patient/family Time For Goal Achievement: 03/08/16 Potential to Achieve Goals: Good    Frequency Min 3X/week   Barriers to discharge        Co-evaluation               End of Session   Activity Tolerance: Patient limited by fatigue;Patient limited by pain Patient left: in bed;with call bell/phone within reach;with bed alarm set;with family/visitor present Nurse Communication: Mobility status         Time:  1451-1501 PT Time Calculation (min) (ACUTE ONLY): 10 min   Charges:   PT Evaluation $PT Eval Moderate Complexity: 1 Procedure     PT G CodesIrwin Brakeman F Feb 29, 2016, 3:34 PM  [facility] [phone] (pager)

## 2016-02-23 NOTE — Progress Notes (Signed)
PROGRESS NOTE    [name]  [id number] DOB: 1934-07-27 DOA: 02/17/2016 PCP: [name], MD   Outpatient Specialists:  [name]   Brief Narrative:  [name] is a 82 y.o. female with medical history significant for but not limited to, atrial fibrillation, CKD, HTN, kidney cancer and gastric cancer. She is status post proximal gastrectomy early May for gastric adenocarcinoma. She was discharged home late May on twice daily PPI and Carafate. She was tolerating soft diet. Patient was readmitted several days later for nausea and vomiting. During that admission she underwent upper endoscopy with findings of grade D esophagitis and 2 small gastric ulcers at the anastomosis. She was given TNA during that admission.. Patient was discharged home 02/13/16 on twice a day PPI and metoclopramide but was unable to tolerate metoclopramide secondary to hives so instead took Maalox. Patient was compliant at home with twice daily PPI and antireflux measures but this am patient began vomiting again. She had been "spitting up" but that turned to vomiting this am. She has had burning discomfort in her throat and chest since last admission. No abdominal pain. Stool chronically loose and unchanged.    Assessment & Plan:   Principal Problem:   Intractable nausea and vomiting Active Problems:   Hypothyroidism   Atrial fibrillation (HCC)   Cardiac pacemaker (06/2010)   Essential hypertension   Peripheral nerve disease (HCC)   Adenocarcinoma of stomach (HCC)   Dehydration   Gastroesophageal reflux disease with esophagitis   AKI (acute kidney injury) ([place])   S/P partial gastrectomy   Nausea & vomiting   Nausea and vomiting   Recurrent nausea and vomiting / dehydration . - had a subtotal gastrectomy early May for gastric cancer  -Readmitted earlier this month with nausea and vomiting  -Upper endoscopy 02/05/16 with findings of severe esophagitis and 2 nonbleeding small  anastomotic ulcers.  -CT scan today suggests wall thickening of the distal esophagus and adjacent stomach at the gastroesophageal anastomosised inflammation. No other findings, no evidence for obstruction. Etiology of recurrent vomiting unclear at this point.  -IV fluids -Given national shortage of IV Protonix will give IV Pepcid twice daily -Anti-emetics -d/c IV erythromycin PICC for TPN---- will need PEG tube -treat with diflucan for presumed thrush  Gastric adenocarcinoma, diagnosed in March. Patient is status post subtotal gastrectomy early May. No evidence for metastasis  GERD/ severe esophagitis on recent EGD -Twice a day IV H2 blocker (national shortage of IV PPI) -Anti-reflux measures  Paroxysmal Atrial fib. Chadsvasc 5. On warfarin , INR 3 . Presenting heart rate 122, suspect this was secondary to volume depletion related to vomiting. Heart rate now 90 -IV metoprolol -Monitor on telemetry -lovenox when INR < 2- holding coumadin as getting procedure (J tube)  Acute kidney injury. Resolved  Peripheral nerve disease.  -resume lyrica  Hypothyroidism. -change to PO  Tachycardia bradycardia syndrome, status post dual chamber pacemaker-  Hypertension, controlled.  -IV  History of kidney cancer, status post nephrectomy  Hypokalemia -replete  Right hip pain lidocaine patch -xray shows osteopenia -responds to robaxin -add heat  DVT prophylaxis:  Warfarin-- change to lovenox when INR < 2  Code Status: DNR   Family Communication: Daughter at bedside  Disposition Plan:     Consultants:   [name]  Procedures:     Antimicrobials:       Subjective: Still with hip pain that makes her nauseaous   Objective: Filed Vitals:   02/23/16 0415 02/23/16 0545 02/23/16 1029 02/23/16  1125  BP: 126/66  157/86 131/83  Pulse: 106  99 99  Temp: 97.9 F (36.6 C)  97.5 F (36.4 C) 97.7 F (36.5 C)  TempSrc: Oral  Oral Oral  Resp: 18  18 18   Height:        Weight:  60.691 kg (133 lb 12.8 oz)    SpO2: 97%  99% 98%    Intake/Output Summary (Last 24 hours) at 02/23/16 1430 Last data filed at 02/23/16 1143  Gross per 24 hour  Intake 1240.17 ml  Output   1275 ml  Net -34.83 ml   Filed Weights   02/21/16 0806 02/22/16 0605 02/23/16 0545  Weight: 59.7 kg (131 lb 9.8 oz) 60.011 kg (132 lb 4.8 oz) 60.691 kg (133 lb 12.8 oz)    Examination:  General exam: Appears calm and comfortable  Respiratory system: Clear to auscultation. Respiratory effort normal. Cardiovascular system: S1 & S2 heard, RRR. No JVD, murmurs, rubs, gallops or clicks. No pedal edema. Gastrointestinal system: Abdomen is nondistended, soft and nontender. No organomegaly or masses felt. Normal bowel sounds heard. Central nervous system: Alert and oriented. No focal neurological deficits.     Data Reviewed: I have personally reviewed following labs and imaging studies  CBC:  Recent Labs Lab 02/17/16 1509  02/18/16 0328 02/19/16 0459 02/20/16 0455 02/22/16 0359 02/23/16 0410  WBC 9.0  --  9.0 5.8 7.2 10.5 10.3  NEUTROABS 6.0  --   --  3.2  --   --  7.1  HGB 14.6  < > 12.1 12.1 12.5 11.5* 10.5*  HCT 45.2  < > 39.0 38.3 38.3 35.5* 33.1*  MCV 93.4  --  93.5 92.7 92.3 92.0 92.7  PLT 217  --  178 167 169 178 165  < > = values in this interval not displayed. Basic Metabolic Panel:  Recent Labs Lab 02/19/16 0459 02/20/16 0455 02/21/16 0520 02/22/16 0359 02/23/16 0410  NA 140 137 136 133* 133*  K 3.1* 3.7 3.0* 3.8 3.7  CL 107 107 103 102 104  CO2 24 25 23 25  20*  GLUCOSE 117* 107* 152* 143* 120*  BUN 10 16 26* 27* 25*  CREATININE 0.88 0.82 0.75 0.75 0.72  CALCIUM 8.6* 8.7* 8.7* 8.6* 8.4*  MG 1.5* 1.8 2.0  --  1.7  PHOS 4.0 4.3 4.3  --  3.3   GFR: Estimated Creatinine Clearance: 46.9 mL/min (by C-G formula based on Cr of 0.72). Liver Function Tests:  Recent Labs Lab 02/17/16 1509 02/19/16 0459 02/23/16 0410  AST 22 20 21   ALT 21 14 13*  ALKPHOS  90 68 73  BILITOT 0.9 0.6 0.8  PROT 6.9 5.8* 5.5*  ALBUMIN 3.6 2.8* 2.6*    Recent Labs Lab 02/17/16 1509  LIPASE 48   No results for input(s): AMMONIA in the last 168 hours. Coagulation Profile:  Recent Labs Lab 02/19/16 0459 02/20/16 0455 02/21/16 0520 02/22/16 0359 02/23/16 0410  INR 2.93* 1.80* 1.40 1.38 1.31   Cardiac Enzymes: No results for input(s): CKTOTAL, CKMB, CKMBINDEX, TROPONINI in the last 168 hours. BNP (last 3 results) No results for input(s): PROBNP in the last 8760 hours. HbA1C: No results for input(s): HGBA1C in the last 72 hours. CBG:  Recent Labs Lab 02/22/16 1941 02/23/16 0007 02/23/16 0409 02/23/16 0827 02/23/16 1141  GLUCAP 140* 132* 121* 144* 133*   Lipid Profile:  Recent Labs  02/23/16 0410  TRIG 76   Thyroid Function Tests: No results for input(s): TSH, T4TOTAL, FREET4, T3FREE,  THYROIDAB in the last 72 hours. Anemia Panel: No results for input(s): VITAMINB12, FOLATE, FERRITIN, TIBC, IRON, RETICCTPCT in the last 72 hours. Urine analysis:    Component Value Date/Time   COLORURINE YELLOW 02/04/2016 0349   APPEARANCEUR CLOUDY* 02/04/2016 0349   LABSPEC 1.023 02/04/2016 0349   PHURINE 5.5 02/04/2016 0349   GLUCOSEU NEGATIVE 02/04/2016 0349   HGBUR MODERATE* 02/04/2016 0349   BILIRUBINUR NEGATIVE 02/04/2016 0349   KETONESUR NEGATIVE 02/04/2016 0349   PROTEINUR 30* 02/04/2016 0349   UROBILINOGEN 0.2 07/22/2014 1120   NITRITE POSITIVE* 02/04/2016 0349   LEUKOCYTESUR MODERATE* 02/04/2016 0349     )No results found for this or any previous visit (from the past 240 hour(s)).    Anti-infectives    Start     Dose/Rate Route Frequency Ordered Stop   02/18/16 1000  fluconazole (DIFLUCAN) IVPB 200 mg     200 mg 100 mL/hr over 60 Minutes Intravenous Every 24 hours 02/18/16 0917     02/17/16 2200  erythromycin 250 mg in sodium chloride 0.9 % 100 mL IVPB  Status:  Discontinued     250 mg 100 mL/hr over 60 Minutes Intravenous Every  12 hours 02/17/16 1720 02/17/16 1853   02/17/16 2100  erythromycin 250 mg in sodium chloride 0.9 % 100 mL IVPB  Status:  Discontinued     250 mg 100 mL/hr over 60 Minutes Intravenous Every 12 hours 02/17/16 1853 02/20/16 0737       Radiology Studies: No results found.      Scheduled Meds: . antiseptic oral rinse  7 mL Mouth Rinse BID  . enoxaparin (LOVENOX) injection  1 mg/kg Subcutaneous Q12H  . feeding supplement (PRO-STAT SUGAR FREE 64)  30 mL Oral Daily  . fluconazole (DIFLUCAN) IV  200 mg Intravenous Q24H  . furosemide  20 mg Intravenous Daily  . insulin aspart  0-9 Units Subcutaneous Q4H  . levothyroxine  50 mcg Oral QAC breakfast  . lidocaine  1 patch Transdermal Q24H  . metoprolol  10 mg Intravenous Q6H  . nystatin  5 mL Oral QID  . pregabalin  75 mg Oral BID   Continuous Infusions: . sodium chloride 10 mL/hr at 02/20/16 2216  . Marland KitchenTPN (CLINIMIX-E) Adult 70 mL/hr at 02/22/16 1754   And  . fat emulsion 240 mL (02/22/16 1753)  . Marland KitchenTPN (CLINIMIX-E) Adult     And  . fat emulsion       LOS: 4 days    Time spent: 25 min    Bradley [place], DO [facility] Pager [phone]  If 7PM-7AM, please contact night-coverage [email or web address] Password [email or web address] 02/23/2016, 2:30 PM

## 2016-02-23 NOTE — Progress Notes (Signed)
Nutrition Follow-up  DOCUMENTATION CODES:   Non-severe (moderate) malnutrition in context of chronic illness  INTERVENTION:  Continue TPN per pharmacy  When J-tube is placed, recommend initiating Vital 1.5 @ 20 ml/hr via J-tube and increase by 10 ml every 4 hours to goal rate of 45 ml/hr. Weaning TPN as TF rate advances.     Tube feeding regimen will provide 1620 kcal (100% of needs), 73 grams of protein, and 821 ml of H2O.     NUTRITION DIAGNOSIS:   Malnutrition related to chronic illness, altered GI function as evidenced by mild depletion of body fat, mild depletion of muscle mass, percent weight loss.  Ongoing  GOAL:   Patient will meet greater than or equal to 90% of their needs  Being Met  MONITOR:   Diet advancement, PO intake, Skin, I & O's, Labs, Weight trends  REASON FOR ASSESSMENT:   Consult New TPN/TNA  ASSESSMENT:   82 y.o. female with a Past Medical History of PAF CAD, tachy/brady syndrome, RLS, GERD, uterine cancer and renal cancer, HTN, HLD, hypothyroidism, gastroparesis, Adenocarcinoma of the stomach s/p partial gastrectomy, ckd, and cva, who presents with nausea vomiting abdominal pain. This seems to be a recurrent issue for patient ever since she underwent partial gastrectomy.   Calorie Count results (PO only) 6/17: 90 kcal, 2 grams protein 6/18: 150 kcal, 4 grams protein  Calorie Count completed over the weekend. PO intake is minimal, but as long as patient is on clear liquids only, energy and protein needs will not be met by PO intake alone anyway. Pt reports ongoing nausea. Per daughter, nausea is made worse with movement due to pain in pt's hip. Pt is eating small amounts of jello and sips of tea/juice only. TPN continues to run and is meeting 100% of energy and protein needs. Per family, plan is to have J-tube placed for continuous TF's with possible decrease to 8 hours infusion time. RD made patient aware that all TF formulas contain zinc, but  usually in the form of zinc sulfate versus the zinc gluconate in lozenges that has made patient nauseous in the past.   Labs: low sodium, low calcium, stable glucose  Diet Order:  Diet clear liquid Room service appropriate?: Yes; Fluid consistency:: Thin TPN (CLINIMIX-E) Adult TPN (CLINIMIX-E) Adult  Skin:  Wound (see comment) (stage I pressure ulcer on sacrum)  Last BM:  6/18  Height:   Ht Readings from Last 1 Encounters:  02/17/16 _0  (1.549 m)    Weight:   Wt Readings from Last 1 Encounters:  02/23/16 133 lb 12.8 oz (60.691 kg)    Ideal Body Weight:  47.7 kg  BMI:  Body mass index is 25.29 kg/(m^2).  Estimated Nutritional Needs:   Kcal:  1500-1700  Protein:  75-90 grams  Fluid:  1.5-1.7 L/day  EDUCATION NEEDS:   No education needs identified at this time  [place], LDN Inpatient Clinical Dietitian Pager: [phone] After Hours Pager: [phone]

## 2016-02-23 NOTE — Care Management Important Message (Signed)
Important Message  Patient Details  Name: [name] MRN: [id number] Date of Birth: [birth year]   Medicare Important Message Given:  Yes    Loann Quill 02/23/2016, 10:04 AM

## 2016-02-23 NOTE — Progress Notes (Signed)
[facility] NOTE  Pharmacy Consult for TPN Indication: intolerance to enteral feeds  Allergies  Allergen Reactions  . Amiodarone Other (See Comments)     Thyroid and liver and kidney problems  . Codeine Nausea And Vomiting  . Gabapentin Itching  . Hydrocodone Other (See Comments)    hallucination  . Morphine And Related Nausea And Vomiting  . Requip [Ropinirole Hcl]     Headache   . Zinc Nausea Only    nausea  . Reglan [Metoclopramide] Hives, Itching and Rash    Made face red, also   Patient Measurements: Height: 5\' 1"  (154.9 cm) Weight: 133 lb 12.8 oz (60.691 kg) IBW/kg (Calculated) : 47.8  Vital Signs: Temp: 97.9 F (36.6 C) (06/19 0415) Temp Source: Oral (06/19 0415) BP: 126/66 mmHg (06/19 0415) Pulse Rate: 106 (06/19 0415) Intake/Output from previous day: 06/18 0701 - 06/19 0700 In: 1198.2 [P.O.:120; IV Piggyback:110; TPN:968.2] Out: 2525 [Urine:2525] Intake/Output from this shift:    Labs:  Recent Labs  02/21/16 0520 02/22/16 0359 02/23/16 0410  WBC  --  10.5 10.3  HGB  --  11.5* 10.5*  HCT  --  35.5* 33.1*  PLT  --  178 165  INR 1.40 1.38 1.31    Recent Labs  02/21/16 0520 02/22/16 0359 02/23/16 0410  NA 136 133* 133*  K 3.0* 3.8 3.7  CL 103 102 104  CO2 23 25 20*  GLUCOSE 152* 143* 120*  BUN 26* 27* 25*  CREATININE 0.75 0.75 0.72  CALCIUM 8.7* 8.6* 8.4*  MG 2.0  --  1.7  PHOS 4.3  --  3.3  PROT  --   --  5.5*  ALBUMIN  --   --  2.6*  AST  --   --  21  ALT  --   --  13*  ALKPHOS  --   --  73  BILITOT  --   --  0.8  PREALBUMIN  --   --  12.9*  TRIG  --   --  76   Estimated Creatinine Clearance: 46.9 mL/min (by C-G formula based on Cr of 0.72).    Recent Labs  02/23/16 0007 02/23/16 0409 02/23/16 0827  GLUCAP 132* 121* 144*   Insulin Requirements in the past 24 hours:  4 units on SSI sensitive q4h  Admit: 47 YOF with history of kidney and gastric cancer, s/p proximal gastrectomy in early May. She was  readmitted earlier in June with unretractable N/V and was on TPN at that time. This was discontinued prior to her going home- confirmed with Wynona Luna from [place].   Surgeries/Procedures: nothing this admission.  GI: GERD / anal fissure / gastric cancer. EGD during last admission showed esophagitis, gastric ulcer at gastric anastomosis, was to be on liquids only for 2 weeks at home. D/c erythromycin for gastroparesis. Having BMs, N/V with ice chips. Prealbumin 19.7>12.9. Jtube in next few days. Diet: clears. Tolerating pills, still poor PO intake -Pepcid, zofran PRN  Endo: hypothyroid on Synthroid back to PO No hx DM. CBGs 120-144 on SSI  Lytes: Na 133, K 3.7, Mag 1.7, Phos down 3.3, CorCa ~9.3  Renal: SCr 0.72 stable, CrCl ~40-34mL/min, good UOP  Pulm: RA  Cards: PAF / CAD / AS / tachy-brady syndrome post PPM / HTN / HLD / CHF, Afib. lasix, Lopressor, diltiazem, LMWH  Hepatobil: LFTs wnl, albumin 2.6, TG 181>76  Neuro: neuropathy / RLS / migraine / anxiety / hx CVA. - Pregabalin, robaxin prn  ID: Pressure ulcer identified - esophagitis - Fluconazole, Nystatin and Erythromycin solution - afebrile, WBC wnl  Best Practices: Lovenox (holding warfarin), MC  TPN Access: PICC placed 6/14 TPN start date: 6/14>>  Current Nutrition:  -Clinimix E 5/15 at 43mL/hr + IV lipids at 68mL/hr- this provides 84g protein and 1672kcal which meets 100% of needs  Nutritional Goals: (per RD recommendations 6/14) 1500-1700 kCal, 75-90 grams of protein per day  Plan:  -Continue Clinimix E 5/15 at 61mL/hr + IV lipids at 44mL/hr- this provides 84g protein and 1672kcal which meets 100% of needs -MVI and trace elements in TPN -Pepcid 40mg  in TPN -Continue sensitive SSI and CBGs q4h -F/u AM labs, may need to supplement phos  Elicia Lamp, PharmD, BCPS Clinical Pharmacist Pager [phone] 02/23/2016 10:16 AM

## 2016-02-24 ENCOUNTER — Inpatient Hospital Stay (HOSPITAL_COMMUNITY): Payer: Medicare Other | Admitting: Anesthesiology

## 2016-02-24 ENCOUNTER — Encounter (HOSPITAL_COMMUNITY)
Admission: EM | Disposition: A | Discharge status: Home Health | Payer: Self-pay | Source: Home / Self Care | Attending: Internal Medicine

## 2016-02-24 HISTORY — PX: GASTRECTOMY: SHX58

## 2016-02-24 LAB — GLUCOSE, CAPILLARY
GLUCOSE-CAPILLARY: 114 mg/dL — AB (ref 65–99)
Glucose-Capillary: 114 mg/dL — ABNORMAL HIGH (ref 65–99)
Glucose-Capillary: 121 mg/dL — ABNORMAL HIGH (ref 65–99)
Glucose-Capillary: 142 mg/dL — ABNORMAL HIGH (ref 65–99)
Glucose-Capillary: 138 mg/dL — ABNORMAL HIGH (ref 65–99)
Glucose-Capillary: 154 mg/dL — ABNORMAL HIGH (ref 65–99)

## 2016-02-24 LAB — PROTIME-INR
INR: 1.31 (ref 0.00–1.49)
Prothrombin Time: 16.5 seconds — ABNORMAL HIGH (ref 11.6–15.2)

## 2016-02-24 LAB — BASIC METABOLIC PANEL
ANION GAP: 9 (ref 5–15)
BUN: 26 mg/dL — ABNORMAL HIGH (ref 6–20)
CHLORIDE: 103 mmol/L (ref 101–111)
CO2: 25 mmol/L (ref 22–32)
Calcium: 8.8 mg/dL — ABNORMAL LOW (ref 8.9–10.3)
Creatinine, Ser: 0.81 mg/dL (ref 0.44–1.00)
GFR calc non Af Amer: >60 mL/min (ref >60)
Glucose, Bld: 114 mg/dL — ABNORMAL HIGH (ref 65–99)
POTASSIUM: 3.4 mmol/L — AB (ref 3.5–5.1)
SODIUM: 137 mmol/L (ref 135–145)

## 2016-02-24 LAB — MAGNESIUM: MAGNESIUM: 1.8 mg/dL (ref 1.7–2.4)

## 2016-02-24 LAB — PHOSPHORUS: PHOSPHORUS: 4.5 mg/dL (ref 2.5–4.6)

## 2016-02-24 SURGERY — GASTRECTOMY, TOTAL
Anesthesia: General | Site: Abdomen | Laterality: Left

## 2016-02-24 MED ORDER — BUPIVACAINE 0.5 % ON-Q PUMP DUAL CATH 300 ML
300.0000 mL | INJECTION | Status: DC
Start: 2016-02-24 — End: 2016-02-27
  Filled 2016-02-24: qty 300

## 2016-02-24 MED ORDER — SUGAMMADEX SODIUM 200 MG/2ML IV SOLN
INTRAVENOUS | Status: DC | PRN
  Administered 2016-02-24: 150 mg via INTRAVENOUS

## 2016-02-24 MED ORDER — PROPOFOL 10 MG/ML IV BOLUS
INTRAVENOUS | Status: AC
  Filled 2016-02-24: qty 20

## 2016-02-24 MED ORDER — LACTATED RINGERS IV SOLN
INTRAVENOUS | Status: DC | PRN
  Administered 2016-02-24: 12:00:00 via INTRAVENOUS

## 2016-02-24 MED ORDER — LABETALOL HCL 5 MG/ML IV SOLN
5.0000 mg | INTRAVENOUS | Status: DC | PRN
Start: 2016-02-24 — End: 2016-02-24
  Administered 2016-02-24: 5 mg via INTRAVENOUS

## 2016-02-24 MED ORDER — LIDOCAINE HCL (CARDIAC) 20 MG/ML IV SOLN
INTRAVENOUS | Status: DC | PRN
  Administered 2016-02-24: 60 mg via INTRAVENOUS

## 2016-02-24 MED ORDER — MEPERIDINE HCL 25 MG/ML IJ SOLN: 6.2500 mg | INTRAMUSCULAR | Status: DC | PRN

## 2016-02-24 MED ORDER — TRACE MINERALS CR-CU-MN-SE-ZN 10-1000-500-60 MCG/ML IV SOLN
INTRAVENOUS | Status: AC
  Administered 2016-02-24: 18:00:00 via INTRAVENOUS
  Filled 2016-02-24: qty 1680

## 2016-02-24 MED ORDER — CEFAZOLIN SODIUM-DEXTROSE 2-4 GM/100ML-% IV SOLN
2.0000 g | INTRAVENOUS | Status: AC
  Administered 2016-02-24: 2 g via INTRAVENOUS
  Filled 2016-02-24 (×2): qty 100

## 2016-02-24 MED ORDER — SUGAMMADEX SODIUM 200 MG/2ML IV SOLN
INTRAVENOUS | Status: AC
  Filled 2016-02-24: qty 2

## 2016-02-24 MED ORDER — FENTANYL CITRATE (PF) 250 MCG/5ML IJ SOLN
INTRAMUSCULAR | Status: AC
  Filled 2016-02-24: qty 5

## 2016-02-24 MED ORDER — FAT EMULSION 20 % IV EMUL
240.0000 mL | INTRAVENOUS | Status: AC
  Administered 2016-02-24: 240 mL via INTRAVENOUS
  Filled 2016-02-24: qty 250

## 2016-02-24 MED ORDER — PHENYLEPHRINE HCL 10 MG/ML IJ SOLN
INTRAMUSCULAR | Status: DC | PRN
  Administered 2016-02-24: 80 ug via INTRAVENOUS

## 2016-02-24 MED ORDER — FAT EMULSION 20 % INFUSION TNA - OPTIME
INTRAVENOUS | Status: DC | PRN
  Administered 2016-02-24: 10 mL/h via INTRAVENOUS

## 2016-02-24 MED ORDER — BUPIVACAINE 0.5 % ON-Q PUMP DUAL CATH 300 ML
300.0000 mL | INJECTION | Status: DC
Start: 2016-02-24 — End: 2016-02-24
  Filled 2016-02-24: qty 300

## 2016-02-24 MED ORDER — PROPOFOL 10 MG/ML IV BOLUS
INTRAVENOUS | Status: DC | PRN
  Administered 2016-02-24: 100 mg via INTRAVENOUS

## 2016-02-24 MED ORDER — PROMETHAZINE HCL 25 MG/ML IJ SOLN: 6.2500 mg | INTRAMUSCULAR | Status: DC | PRN

## 2016-02-24 MED ORDER — ONDANSETRON HCL 4 MG/2ML IJ SOLN
INTRAMUSCULAR | Status: DC | PRN
  Administered 2016-02-24: 4 mg via INTRAVENOUS

## 2016-02-24 MED ORDER — 0.9 % SODIUM CHLORIDE (POUR BTL) OPTIME
TOPICAL | Status: DC | PRN
  Administered 2016-02-24: 1000 mL

## 2016-02-24 MED ORDER — LABETALOL HCL 5 MG/ML IV SOLN
INTRAVENOUS | Status: AC
  Filled 2016-02-24: qty 4

## 2016-02-24 MED ORDER — ONDANSETRON HCL 4 MG/2ML IJ SOLN
INTRAMUSCULAR | Status: AC
  Filled 2016-02-24: qty 2

## 2016-02-24 MED ORDER — FENTANYL CITRATE (PF) 100 MCG/2ML IJ SOLN
25.0000 ug | INTRAMUSCULAR | Status: DC | PRN
  Administered 2016-02-24: 25 ug via INTRAVENOUS

## 2016-02-24 MED ORDER — BUPIVACAINE-EPINEPHRINE (PF) 0.25% -1:200000 IJ SOLN
INTRAMUSCULAR | Status: AC
  Filled 2016-02-24: qty 30

## 2016-02-24 MED ORDER — FENTANYL CITRATE (PF) 100 MCG/2ML IJ SOLN
INTRAMUSCULAR | Status: DC | PRN
  Administered 2016-02-24 (×5): 50 ug via INTRAVENOUS

## 2016-02-24 MED ORDER — LACTATED RINGERS IV SOLN: INTRAVENOUS | Status: DC

## 2016-02-24 MED ORDER — PHENYLEPHRINE 40 MCG/ML (10ML) SYRINGE FOR IV PUSH (FOR BLOOD PRESSURE SUPPORT)
PREFILLED_SYRINGE | INTRAVENOUS | Status: AC
  Filled 2016-02-24: qty 20

## 2016-02-24 MED ORDER — POTASSIUM CHLORIDE 10 MEQ/50ML IV SOLN
10.0000 meq | INTRAVENOUS | Status: AC
  Administered 2016-02-24: 10 meq via INTRAVENOUS
  Filled 2016-02-24 (×2): qty 50

## 2016-02-24 MED ORDER — FENTANYL CITRATE (PF) 100 MCG/2ML IJ SOLN
INTRAMUSCULAR | Status: AC
  Filled 2016-02-24: qty 2

## 2016-02-24 MED ORDER — ROCURONIUM BROMIDE 50 MG/5ML IV SOLN
INTRAVENOUS | Status: AC
  Filled 2016-02-24: qty 1

## 2016-02-24 MED ORDER — ROCURONIUM BROMIDE 100 MG/10ML IV SOLN
INTRAVENOUS | Status: DC | PRN
  Administered 2016-02-24: 40 mg via INTRAVENOUS

## 2016-02-24 MED ORDER — LIDOCAINE HCL (PF) 1 % IJ SOLN
INTRAMUSCULAR | Status: AC
Start: 2016-02-24 — End: 2016-02-24
  Filled 2016-02-24: qty 30

## 2016-02-24 SURGICAL SUPPLY — 53 items
CANISTER SUCTION 2500CC (MISCELLANEOUS) ×3 IMPLANT
CATH KIT ON-Q SILVERSOAK 5IN (CATHETERS) ×6 IMPLANT
CATH ROBINSON RED A/P 18FR (CATHETERS) ×3 IMPLANT
CHLORAPREP W/TINT 26ML (MISCELLANEOUS) ×3 IMPLANT
CLIP LIGATING HEM O LOK PURPLE (MISCELLANEOUS) IMPLANT
CLIP LIGATING HEMO O LOK GREEN (MISCELLANEOUS) IMPLANT
CLIP LIGATING HEMOLOK MED (MISCELLANEOUS) IMPLANT
CLIP TI LARGE 6 (CLIP) IMPLANT
CLIP TI MEDIUM 24 (CLIP) IMPLANT
DRAIN PENROSE 1/2X36 STERILE (WOUND CARE) IMPLANT
DRAPE LAPAROSCOPIC ABDOMINAL (DRAPES) ×3 IMPLANT
DRAPE UTILITY XL STRL (DRAPES) ×6 IMPLANT
DRAPE WARM FLUID 44X44 (DRAPE) ×3 IMPLANT
DRSG COVADERM 4X10 (GAUZE/BANDAGES/DRESSINGS) ×3 IMPLANT
DRSG COVADERM 4X8 (GAUZE/BANDAGES/DRESSINGS) IMPLANT
ELECT BLADE 6.5 EXT (BLADE) ×3 IMPLANT
ELECT CAUTERY BLADE 6.4 (BLADE) ×3 IMPLANT
ELECT REM PT RETURN 9FT ADLT (ELECTROSURGICAL) ×3
ELECTRODE REM PT RTRN 9FT ADLT (ELECTROSURGICAL) ×1 IMPLANT
GAUZE SPONGE 4X4 12PLY STRL (GAUZE/BANDAGES/DRESSINGS) ×3 IMPLANT
GLOVE BIO SURGEON STRL SZ 6 (GLOVE) ×6 IMPLANT
GLOVE BIO SURGEON STRL SZ7 (GLOVE) ×3 IMPLANT
GLOVE BIOGEL PI IND STRL 6.5 (GLOVE) ×1 IMPLANT
GLOVE BIOGEL PI IND STRL 7.0 (GLOVE) ×3 IMPLANT
GLOVE BIOGEL PI INDICATOR 6.5 (GLOVE) ×2
GLOVE BIOGEL PI INDICATOR 7.0 (GLOVE) ×6
GLOVE INDICATOR 6.5 STRL GRN (GLOVE) ×3 IMPLANT
GLOVE SURG SS PI 6.5 STRL IVOR (GLOVE) ×3 IMPLANT
GOWN STRL REUS W/ TWL LRG LVL3 (GOWN DISPOSABLE) ×2 IMPLANT
GOWN STRL REUS W/TWL 2XL LVL3 (GOWN DISPOSABLE) ×6 IMPLANT
GOWN STRL REUS W/TWL LRG LVL3 (GOWN DISPOSABLE) ×4
KIT BASIN OR (CUSTOM PROCEDURE TRAY) ×3 IMPLANT
KIT ROOM TURNOVER OR (KITS) ×3 IMPLANT
NS IRRIG 1000ML POUR BTL (IV SOLUTION) ×6 IMPLANT
PACK GENERAL/GYN (CUSTOM PROCEDURE TRAY) ×3 IMPLANT
PAD ARMBOARD 7.5X6 YLW CONV (MISCELLANEOUS) ×6 IMPLANT
PLUG CATH AND CAP STER (CATHETERS) ×3 IMPLANT
SHEARS FOC LG CVD HARMONIC 17C (MISCELLANEOUS) IMPLANT
SPECIMEN JAR X LARGE (MISCELLANEOUS) IMPLANT
SPONGE GAUZE 4X4 12PLY STER LF (GAUZE/BANDAGES/DRESSINGS) ×3 IMPLANT
SPONGE LAP 18X18 X RAY DECT (DISPOSABLE) ×3 IMPLANT
STAPLER VISISTAT 35W (STAPLE) ×3 IMPLANT
SUT ETHILON 2 0 FS 18 (SUTURE) ×6 IMPLANT
SUT PDS AB 1 TP1 96 (SUTURE) IMPLANT
SUT PDS AB 3-0 SH 27 (SUTURE) IMPLANT
SUT PDS II 0 TP-1 LOOPED 60 (SUTURE) ×6 IMPLANT
SUT SILK 2 0 SH CR/8 (SUTURE) ×3 IMPLANT
SUT SILK 2 0 TIES 10X30 (SUTURE) ×3 IMPLANT
SUT SILK 3 0 SH CR/8 (SUTURE) ×3 IMPLANT
SUT SILK 3 0 TIES 10X30 (SUTURE) ×3 IMPLANT
SYRINGE TOOMEY DISP (SYRINGE) ×3 IMPLANT
TOWEL OR 17X26 10 PK STRL BLUE (TOWEL DISPOSABLE) ×3 IMPLANT
TUNNELER SHEATH ON-Q 16GX12 DP (PAIN MANAGEMENT) ×3 IMPLANT

## 2016-02-24 NOTE — Interval H&P Note (Signed)
History and Physical Interval Note:  02/24/2016 11:20 AM  [name]  has presented today for surgery, with the diagnosis of GASTRIC CANCER  The various methods of treatment have been discussed with the patient and family. After consideration of risks, benefits and other options for treatment, the patient has consented to  Procedure(s): OPEN J TUBE (N/A) as a surgical intervention .  The patient's history has been reviewed, patient examined, no change in status, stable for surgery.  I have reviewed the patient's chart and labs.  Questions were answered to the patient's satisfaction.     [name]

## 2016-02-24 NOTE — Op Note (Signed)
PRE-OPERATIVE DIAGNOSIS: gastric cancer, failure to take PO  POST-OPERATIVE DIAGNOSIS:  Same  PROCEDURE:  Procedure(s): Jejunostomy tube  SURGEON:  Surgeon(s): Stark Klein, MD  ASSISTANT: Roselyn Reef Sipsis, RNFA  ANESTHESIA:   general  DRAINS: Jejunostomy Tube, 18 Fr red rubber catheter  LOCAL MEDICATIONS USED:  BUPIVICAINE   SPECIMEN:  No Specimen  DISPOSITION OF SPECIMEN:  N/A  COUNTS:  YES  DICTATION: .Dragon Dictation  PLAN OF CARE: back to regular room after PACU (telemetry)  PATIENT DISPOSITION:  PACU - hemodynamically stable.  FINDINGS:  Upper abdominal adhesions  EBL: min  PROCEDURE:  Patient was identified in the holding area and taken to the operating room where she was placed supine on operating room table. General endotracheal anesthesia was induced. Her abdomen was then prepped and draped in sterile fashion. Timeout was performed according to the surgical safety checklist. When all was correct, we continued.  The lower portion of the patient's midline incision was opened with a #10 blade. This was carried to just below the umbilicus to her older previous midline incision. The subcutaneous tissues were divided with the cautery. The fascia was elevated using two Kocher clamps. This was incised sharply. When we made sure there were no underlying adhesions of the lower portion of the incision, the fascial incision was opened the length of the skin incision. This was done with the cautery. The upper portion of the wound had omentum adherent to it. This was taken down with the cautery. The colon was located and elevated. The ligament of Treitz was identified and approximately 20-30 cm beyond that was identified. A 3-0 silk pursestring suture was placed in the small intestine on the antimesenteric border. An appropriate location for a J-tube is located in the left upper abdomen. A incision was made in the tonsil used to pull the J-tube through the abdominal wall. The jejunum  was opened and the tube was threaded distally in the jejunum.    Witzel sutures were placed proximally on the jejunum.  The tube was then pexed to the abdominal wall with 2-0 silk sutures.  The tube was flushed with saline.  The abdomen was then irrigated.  The OnQ tunnelers were placed in the preperitoneal space on both sides of the fascial incision.  The fascia was closed with #1 looped PDS.    The skin was irrigated and closed with staples.  The catheters were advanced into the tunnelers and the tunnelers removed.  The skin was cleaned, dried, and dressed with a soft sterile dressing.    The patient was allowed to emerge from anesthesia and was taken to the PACU in stable condition.  Needle, sponge, and instrument counts were correct x 2.

## 2016-02-24 NOTE — Progress Notes (Signed)
PROGRESS NOTE    [name]  [id number] DOB: [birth year] DOA: 02/17/2016 PCP: [name], MD   Outpatient Specialists:  [name]   Brief Narrative:  [name] is a 82 y.o. female with medical history significant for but not limited to, atrial fibrillation, CKD, HTN, kidney cancer and gastric cancer. She is status post proximal gastrectomy early May for gastric adenocarcinoma. She was discharged home late May on twice daily PPI and Carafate. She was tolerating soft diet. Patient was readmitted several days later for nausea and vomiting. During that admission she underwent upper endoscopy with findings of grade D esophagitis and 2 small gastric ulcers at the anastomosis. She was given TNA during that admission.. Patient was discharged home 02/13/16 on twice a day PPI and metoclopramide but was unable to tolerate metoclopramide secondary to hives so instead took Maalox. Patient was compliant at home with twice daily PPI and antireflux measures but this am patient began vomiting again. She had been "spitting up" but that turned to vomiting this am. She has had burning discomfort in her throat and chest since last admission. No abdominal pain. Stool chronically loose and unchanged.    Assessment & Plan:   Principal Problem:   Intractable nausea and vomiting Active Problems:   Hypothyroidism   Atrial fibrillation (HCC)   Cardiac pacemaker (06/2010)   Essential hypertension   Peripheral nerve disease (HCC)   Adenocarcinoma of stomach (HCC)   Dehydration   Gastroesophageal reflux disease with esophagitis   AKI (acute kidney injury) ([place])   S/P partial gastrectomy   Nausea & vomiting   Nausea and vomiting   Recurrent nausea and vomiting / dehydration . - had a subtotal gastrectomy early May for gastric cancer  -Readmitted earlier this month with nausea and vomiting  -Upper endoscopy 02/05/16 with findings of severe esophagitis and 2 nonbleeding small  anastomotic ulcers.  -CT scan today suggests wall thickening of the distal esophagus and adjacent stomach at the gastroesophageal anastomosised inflammation. No other findings, no evidence for obstruction. Etiology of recurrent vomiting unclear at this point.  -IV fluids -Given national shortage of IV Protonix will give IV Pepcid twice daily -Anti-emetics -d/c IV erythromycin PICC for TPN---- S/p J tube-- can NOT put meds in J tube unless elixer (nut crushed into liquids)- tube feeding to start tomm -treat with diflucan for presumed thrush  Gastric adenocarcinoma, diagnosed in March. Patient is status post subtotal gastrectomy early May. No evidence for metastasis  GERD/ severe esophagitis on recent EGD -Twice a day IV H2 blocker (national shortage of IV PPI) -Anti-reflux measures  Paroxysmal Atrial fib. Chadsvasc 5. On warfarin , INR 3 . Presenting heart rate 122, suspect this was secondary to volume depletion related to vomiting. Heart rate now 90 -IV metoprolol -Monitor on telemetry -lovenox when INR < 2- holding coumadin as getting procedure (J tube)  Acute kidney injury. Resolved  Peripheral nerve disease.  -resume lyrica  Hypothyroidism. -change to PO  Tachycardia bradycardia syndrome, status post dual chamber pacemaker-  Hypertension, controlled.  -IV  History of kidney cancer, status post nephrectomy  Hypokalemia -replete  Right hip pain lidocaine patch -xray shows osteopenia -responds to robaxin -add heat  DVT prophylaxis:  Warfarin-- change to lovenox when INR < 2  Code Status: DNR   Family Communication: Daughters at bedside  Disposition Plan:     Consultants:   [name]  Procedures:     Antimicrobials:       Subjective: Still with hip pain that  makes her nauseaous   Objective: Filed Vitals:   02/23/16 1125 02/23/16 2053 02/24/16 0411 02/24/16 0858  BP: 131/83 131/63 127/53 124/76  Pulse: 99 60 99 106  Temp: 97.7 F (36.5  C) 98.4 F (36.9 C) 97.7 F (36.5 C) 98 F (36.7 C)  TempSrc: Oral Oral Oral Oral  Resp: 18 16 18 18   Height:      Weight:   61.372 kg (135 lb 4.8 oz)   SpO2: 98% 98% 99% 100%    Intake/Output Summary (Last 24 hours) at 02/24/16 1341 Last data filed at 02/24/16 1304  Gross per 24 hour  Intake 2510.5 ml  Output   1500 ml  Net 1010.5 ml   Filed Weights   02/22/16 0605 02/23/16 0545 02/24/16 0411  Weight: 60.011 kg (132 lb 4.8 oz) 60.691 kg (133 lb 12.8 oz) 61.372 kg (135 lb 4.8 oz)    Examination:  General exam: Appears calm and comfortable  Respiratory system: Clear to auscultation. Respiratory effort normal. Cardiovascular system: S1 & S2 heard, RRR. No JVD, murmurs, rubs, gallops or clicks. No pedal edema. Gastrointestinal system: Abdomen is nondistended, soft and nontender. No organomegaly or masses felt. Normal bowel sounds heard. Central nervous system: Alert and oriented. No focal neurological deficits.     Data Reviewed: I have personally reviewed following labs and imaging studies  CBC:  Recent Labs Lab 02/17/16 1509  02/18/16 0328 02/19/16 0459 02/20/16 0455 02/22/16 0359 02/23/16 0410  WBC 9.0  --  9.0 5.8 7.2 10.5 10.3  NEUTROABS 6.0  --   --  3.2  --   --  7.1  HGB 14.6  < > 12.1 12.1 12.5 11.5* 10.5*  HCT 45.2  < > 39.0 38.3 38.3 35.5* 33.1*  MCV 93.4  --  93.5 92.7 92.3 92.0 92.7  PLT 217  --  178 167 169 178 165  < > = values in this interval not displayed. Basic Metabolic Panel:  Recent Labs Lab 02/19/16 0459 02/20/16 0455 02/21/16 0520 02/22/16 0359 02/23/16 0410 02/24/16 0500  NA 140 137 136 133* 133* 137  K 3.1* 3.7 3.0* 3.8 3.7 3.4*  CL 107 107 103 102 104 103  CO2 24 25 23 25  20* 25  GLUCOSE 117* 107* 152* 143* 120* 114*  BUN 10 16 26* 27* 25* 26*  CREATININE 0.88 0.82 0.75 0.75 0.72 0.81  CALCIUM 8.6* 8.7* 8.7* 8.6* 8.4* 8.8*  MG 1.5* 1.8 2.0  --  1.7 1.8  PHOS 4.0 4.3 4.3  --  3.3 4.5   GFR: Estimated Creatinine  Clearance: 46.5 mL/min (by C-G formula based on Cr of 0.81). Liver Function Tests:  Recent Labs Lab 02/17/16 1509 02/19/16 0459 02/23/16 0410  AST 22 20 21   ALT 21 14 13*  ALKPHOS 90 68 73  BILITOT 0.9 0.6 0.8  PROT 6.9 5.8* 5.5*  ALBUMIN 3.6 2.8* 2.6*    Recent Labs Lab 02/17/16 1509  LIPASE 48   No results for input(s): AMMONIA in the last 168 hours. Coagulation Profile:  Recent Labs Lab 02/20/16 0455 02/21/16 0520 02/22/16 0359 02/23/16 0410 02/24/16 0500  INR 1.80* 1.40 1.38 1.31 1.31   Cardiac Enzymes: No results for input(s): CKTOTAL, CKMB, CKMBINDEX, TROPONINI in the last 168 hours. BNP (last 3 results) No results for input(s): PROBNP in the last 8760 hours. HbA1C: No results for input(s): HGBA1C in the last 72 hours. CBG:  Recent Labs Lab 02/23/16 1945 02/23/16 2337 02/24/16 0405 02/24/16 0750 02/24/16 1107  GLUCAP  112* 129* 114* 121* 114*   Lipid Profile:  Recent Labs  02/23/16 0410  TRIG 76   Thyroid Function Tests: No results for input(s): TSH, T4TOTAL, FREET4, T3FREE, THYROIDAB in the last 72 hours. Anemia Panel: No results for input(s): VITAMINB12, FOLATE, FERRITIN, TIBC, IRON, RETICCTPCT in the last 72 hours. Urine analysis:    Component Value Date/Time   COLORURINE YELLOW 02/04/2016 0349   APPEARANCEUR CLOUDY* 02/04/2016 0349   LABSPEC 1.023 02/04/2016 0349   PHURINE 5.5 02/04/2016 0349   GLUCOSEU NEGATIVE 02/04/2016 0349   HGBUR MODERATE* 02/04/2016 0349   BILIRUBINUR NEGATIVE 02/04/2016 0349   KETONESUR NEGATIVE 02/04/2016 0349   PROTEINUR 30* 02/04/2016 0349   UROBILINOGEN 0.2 07/22/2014 1120   NITRITE POSITIVE* 02/04/2016 0349   LEUKOCYTESUR MODERATE* 02/04/2016 0349     )No results found for this or any previous visit (from the past 240 hour(s)).    Anti-infectives    Start     Dose/Rate Route Frequency Ordered Stop   02/24/16 1000  ceFAZolin (ANCEF) IVPB 2g/100 mL premix    Comments:  On call to OR.   2 g 200  mL/hr over 30 Minutes Intravenous To ShortStay Surgical 02/24/16 0848 02/24/16 1215   02/18/16 1000  [MAR Hold]  fluconazole (DIFLUCAN) IVPB 200 mg     (MAR Hold since 02/24/16 1045)   200 mg 100 mL/hr over 60 Minutes Intravenous Every 24 hours 02/18/16 0917     02/17/16 2200  erythromycin 250 mg in sodium chloride 0.9 % 100 mL IVPB  Status:  Discontinued     250 mg 100 mL/hr over 60 Minutes Intravenous Every 12 hours 02/17/16 1720 02/17/16 1853   02/17/16 2100  erythromycin 250 mg in sodium chloride 0.9 % 100 mL IVPB  Status:  Discontinued     250 mg 100 mL/hr over 60 Minutes Intravenous Every 12 hours 02/17/16 1853 02/20/16 0737       Radiology Studies: No results found.      Scheduled Meds: . [MAR Hold] antiseptic oral rinse  7 mL Mouth Rinse BID  . [MAR Hold] fluconazole (DIFLUCAN) IV  200 mg Intravenous Q24H  . [MAR Hold] furosemide  20 mg Intravenous Daily  . [MAR Hold] insulin aspart  0-9 Units Subcutaneous Q4H  . [MAR Hold] levothyroxine  50 mcg Oral QAC breakfast  . [MAR Hold] lidocaine  1 patch Transdermal Q24H  . [MAR Hold] metoprolol  10 mg Intravenous Q6H  . [MAR Hold] nystatin  5 mL Oral QID  . [MAR Hold] pregabalin  75 mg Oral BID   Continuous Infusions: . sodium chloride 10 mL/hr at 02/20/16 2216  . bupivacaine 0.5 % ON-Q pump DUAL CATH 300 mL    . Marland KitchenTPN (CLINIMIX-E) Adult 70 mL/hr (02/24/16 1200)   And  . fat emulsion 240 mL (02/23/16 1838)  . Marland KitchenTPN (CLINIMIX-E) Adult     And  . fat emulsion       LOS: 5 days    Time spent: 25 min    [place], DO [facility] Pager [phone]  If 7PM-7AM, please contact night-coverage [email or web address] Password [email or web address] 02/24/2016, 1:41 PM

## 2016-02-24 NOTE — Transfer of Care (Signed)
Immediate Anesthesia Transfer of Care Note  Patient: LUDA VANDEPUTTE  Procedure(s) Performed: Procedure(s): OPEN J TUBE (Left)  Patient Location: PACU  Anesthesia Type:General  Level of Consciousness: awake, alert , oriented and patient cooperative  Airway & Oxygen Therapy: Patient Spontanous Breathing and Patient connected to face mask oxygen  Post-op Assessment: Report given to RN, Post -op Vital signs reviewed and stable and Heart rate remains as intraop.  Patient denies pain.  Post vital signs: Reviewed and stable  Last Vitals:  Filed Vitals:   02/24/16 0411 02/24/16 0858  BP: 127/53 124/76  Pulse: 99 106  Temp: 36.5 C 36.7 C  Resp: 18 18    Last Pain:  Filed Vitals:   02/24/16 0859  PainSc: 0-No pain      Patients Stated Pain Goal: 0 ([id number])  Complications: No apparent anesthesia complications

## 2016-02-24 NOTE — H&P (View-Only) (Signed)
Patient ID: [name], female   DOB: 11/09/1933, 82 y.o.   MRN: [id number]    Subjective: Tolerating pills now, but still poor PO intake.     Objective: Vital signs in last 24 hours: Temp:  [97.8 F (36.6 C)-97.9 F (36.6 C)] 97.9 F (36.6 C) (06/19 0415) Pulse Rate:  [90-106] 106 (06/19 0415) Resp:  [18] 18 (06/19 0415) BP: (117-129)/(66-83) 126/66 mmHg (06/19 0415) SpO2:  [97 %-99 %] 97 % (06/19 0415) Weight:  [60.691 kg (133 lb 12.8 oz)] 60.691 kg (133 lb 12.8 oz) (06/19 0545) Last BM Date: 02/22/16  Intake/Output from previous day: 06/18 0701 - 06/19 0700 In: 1198.2 [P.O.:120; IV Piggyback:110; TPN:968.2] Out: 2525 [Urine:2525] Intake/Output this shift:    General appearance: looks weak Resp: breathing comfortably GI: soft, non tender, non distended  Lab Results:   Recent Labs  02/22/16 0359 02/23/16 0410  WBC 10.5 10.3  HGB 11.5* 10.5*  HCT 35.5* 33.1*  PLT 178 165   BMET  Recent Labs  02/22/16 0359 02/23/16 0410  NA 133* 133*  K 3.8 3.7  CL 102 104  CO2 25 20*  GLUCOSE 143* 120*  BUN 27* 25*  CREATININE 0.75 0.72  CALCIUM 8.6* 8.4*   PT/INR  Recent Labs  02/22/16 0359 02/23/16 0410  LABPROT 17.1* 16.4*  INR 1.38 1.31   ABG No results for input(s): PHART, HCO3 in the last 72 hours.  Invalid input(s): PCO2, PO2  Studies/Results: Dg Hip Unilat With Pelvis 2-3 Views Right  02/21/2016  CLINICAL DATA:  Right hip pain.  Renal cell cancer. EXAM: DG HIP (WITH OR WITHOUT PELVIS) 2-3V RIGHT COMPARISON:  02/17/2016 CT abdomen/pelvis. FINDINGS: Status post right total hip arthroplasty, with no evidence of hardware fracture or loosening. No right hip dislocation. Diffuse osteopenia. No osseous fracture or suspicious focal osseous lesion. Bilateral deep pelvic surgical clips. Degenerative changes in the visualized lower lumbar spine. IMPRESSION: No osseous fracture. No right hip dislocation. Right total hip arthroplasty, with no acute hardware  complication. Diffuse osteopenia. Electronically Signed   By: Ilona Sorrel M.D.   On: 02/21/2016 12:19    Anti-infectives: Anti-infectives    Start     Dose/Rate Route Frequency Ordered Stop   02/18/16 1000  fluconazole (DIFLUCAN) IVPB 200 mg     200 mg 100 mL/hr over 60 Minutes Intravenous Every 24 hours 02/18/16 0917     02/17/16 2200  erythromycin 250 mg in sodium chloride 0.9 % 100 mL IVPB  Status:  Discontinued     250 mg 100 mL/hr over 60 Minutes Intravenous Every 12 hours 02/17/16 1720 02/17/16 1853   02/17/16 2100  erythromycin 250 mg in sodium chloride 0.9 % 100 mL IVPB  Status:  Discontinued     250 mg 100 mL/hr over 60 Minutes Intravenous Every 12 hours 02/17/16 1853 02/20/16 0737      Assessment/Plan: s/p * No surgery found * s/p partial gastrectomy 3-4 weeks ago.   Esophagitis-clears.  Meds back to PO.  Protein calorie malnutrition- TNA   Will plan J tube in next few days.    [facility] 02/23/2016

## 2016-02-24 NOTE — Progress Notes (Signed)
[place] NOTE  Pharmacy Consult for TPN Indication: intolerance to enteral feeds  Allergies  Allergen Reactions  . Amiodarone Other (See Comments)     Thyroid and liver and kidney problems  . Codeine Nausea And Vomiting  . Gabapentin Itching  . Hydrocodone Other (See Comments)    hallucination  . Morphine And Related Nausea And Vomiting  . Requip [Ropinirole Hcl]     Headache   . Zinc Nausea Only    nausea  . Reglan [Metoclopramide] Hives, Itching and Rash    Made face red, also   Patient Measurements: Height: 5\' 1"  (154.9 cm) Weight: 135 lb 4.8 oz (61.372 kg) (Scale A) IBW/kg (Calculated) : 47.8  Vital Signs: Temp: 98 F (36.7 C) (06/20 0858) Temp Source: Oral (06/20 0858) BP: 124/76 mmHg (06/20 0858) Pulse Rate: 106 (06/20 0858) Intake/Output from previous day: 06/19 0701 - 06/20 0700 In: 2582.5 [P.O.:320; I.V.:2; IV Piggyback:110; TPN:2150.5] Out: [id number] [Urine:1375] Intake/Output from this shift:    Labs:  Recent Labs  02/22/16 0359 02/23/16 0410 02/24/16 0500  WBC 10.5 10.3  --   HGB 11.5* 10.5*  --   HCT 35.5* 33.1*  --   PLT 178 165  --   INR 1.38 1.31 1.31    Recent Labs  02/22/16 0359 02/23/16 0410 02/24/16 0500  NA 133* 133* 137  K 3.8 3.7 3.4*  CL 102 104 103  CO2 25 20* 25  GLUCOSE 143* 120* 114*  BUN 27* 25* 26*  CREATININE 0.75 0.72 0.81  CALCIUM 8.6* 8.4* 8.8*  MG  --  1.7 1.8  PHOS  --  3.3 4.5  PROT  --  5.5*  --   ALBUMIN  --  2.6*  --   AST  --  21  --   ALT  --  13*  --   ALKPHOS  --  73  --   BILITOT  --  0.8  --   PREALBUMIN  --  12.9*  --   TRIG  --  76  --    Estimated Creatinine Clearance: 46.5 mL/min (by C-G formula based on Cr of 0.81).    Recent Labs  02/23/16 2337 02/24/16 0405 02/24/16 0750  GLUCAP 129* 114* 121*   Insulin Requirements in the past 24 hours:  3 units on SSI sensitive q4h  Admit: 60 YOF with history of kidney and gastric cancer, s/p proximal gastrectomy in early  May. She was readmitted earlier in June with unretractable N/V and was on TPN at that time. This was discontinued prior to her going home- confirmed with Wynona Luna from [place].   Surgeries/Procedures: nothing this admission.  GI: GERD / anal fissure / gastric cancer. EGD during last admission showed esophagitis, gastric ulcer at gastric anastomosis, was to be on liquids only for 2 weeks at home. D/c erythromycin for gastroparesis. Having BMs, N/V with ice chips. Prealbumin 19.7>12.9. Jtube placement today. Diet: clears. Tolerating pills, still poor PO intake -Pepcid, zofran PRN  Endo: hypothyroid on Synthroid back to PO No hx DM. CBGs 112-121 on SSI  Lytes: K 3.4, Mag 1.8, Phos up 4.5, CorCa ~9.7  Renal: SCr 0.81 stable, CrCl ~40-28mL/min, good UOP  Pulm: RA  Cards: PAF / CAD / AS / tachy-brady syndrome post PPM / HTN / HLD / CHF, Afib. lasix, Lopressor, diltiazem, LMWH  Hepatobil: LFTs wnl, albumin 2.6, TG 181>76  Neuro: neuropathy / RLS / migraine / anxiety / hx CVA. - Pregabalin, robaxin prn  ID: Pressure ulcer identified - esophagitis - Fluconazole, Nystatin and Erythromycin solution - afebrile, WBC wnl  Best Practices: Lovenox (holding warfarin), MC  TPN Access: PICC placed 6/14 TPN start date: 6/14>>  Current Nutrition:  -Clinimix E 5/15 at 27mL/hr + IV lipids at 94mL/hr- this provides 84g protein and 1672kcal which meets 100% of needs  Nutritional Goals: (per RD recommendations 6/14) 1500-1700 kCal, 75-90 grams of protein per day  Plan:  -Continue Clinimix E 5/15 at 81mL/hr + IV lipids at 100mL/hr- this provides 84g protein and 1672kcal which meets 100% of needs -MVI and trace elements in TPN -Pepcid 40mg  in TPN -Continue sensitive SSI and CBGs q4h -K runs x2 -F/u AM labs -F/u Jtube placement and ability to start TF  Elicia Lamp, PharmD, BCPS Clinical Pharmacist Pager [phone] 02/24/2016 9:22 AM

## 2016-02-24 NOTE — Anesthesia Preprocedure Evaluation (Addendum)
Anesthesia Evaluation  Patient identified by MRN, date of birth, ID band Patient awake    Reviewed: Allergy & Precautions, NPO status , Patient's Chart, lab work & pertinent test results, reviewed documented beta blocker date and time   Airway Mallampati: II  TM Distance: >3 FB Neck ROM: full    Dental  (+) Dental Advidsory Given, Edentulous Upper   Pulmonary neg pulmonary ROS,    breath sounds clear to auscultation       Cardiovascular hypertension, Pt. on medications and Pt. on home beta blockers + CAD, + Peripheral Vascular Disease and +CHF  + dysrhythmias Atrial Fibrillation + Valvular Problems/Murmurs AS  Rhythm:irregular Rate:Abnormal     Neuro/Psych PSYCHIATRIC DISORDERS Anxiety  Neuromuscular disease CVA    GI/Hepatic PUD, GERD  Medicated,  Endo/Other  Hypothyroidism   Renal/GU   negative genitourinary   Musculoskeletal negative musculoskeletal ROS (+)   Abdominal   Peds negative pediatric ROS (+)  Hematology   Anesthesia Other Findings   Reproductive/Obstetrics negative OB ROS                          Anesthesia Physical Anesthesia Plan  ASA: III  Anesthesia Plan: General   Post-op Pain Management:    Induction: Intravenous  Airway Management Planned: Oral ETT  Additional Equipment:   Intra-op Plan:   Post-operative Plan: Extubation in OR  Informed Consent:   Dental advisory given and Dental Advisory Given  Plan Discussed with: CRNA, Anesthesiologist and Surgeon  Anesthesia Plan Comments:        Anesthesia Quick Evaluation

## 2016-02-25 ENCOUNTER — Telehealth: Payer: Self-pay | Admitting: Cardiology

## 2016-02-25 ENCOUNTER — Encounter (HOSPITAL_COMMUNITY): Payer: Self-pay | Admitting: General Surgery

## 2016-02-25 ENCOUNTER — Encounter: Payer: Medicare Other | Admitting: *Deleted

## 2016-02-25 LAB — BASIC METABOLIC PANEL
ANION GAP: 7 (ref 5–15)
BUN: 21 mg/dL — AB (ref 6–20)
CO2: 23 mmol/L (ref 22–32)
Calcium: 8.4 mg/dL — ABNORMAL LOW (ref 8.9–10.3)
Chloride: 104 mmol/L (ref 101–111)
Creatinine, Ser: 0.64 mg/dL (ref 0.44–1.00)
Glucose, Bld: 120 mg/dL — ABNORMAL HIGH (ref 65–99)
POTASSIUM: 4 mmol/L (ref 3.5–5.1)
Sodium: 134 mmol/L — ABNORMAL LOW (ref 135–145)

## 2016-02-25 LAB — GLUCOSE, CAPILLARY
GLUCOSE-CAPILLARY: 109 mg/dL — AB (ref 65–99)
GLUCOSE-CAPILLARY: 125 mg/dL — AB (ref 65–99)
GLUCOSE-CAPILLARY: 148 mg/dL — AB (ref 65–99)
GLUCOSE-CAPILLARY: 152 mg/dL — AB (ref 65–99)
Glucose-Capillary: 145 mg/dL — ABNORMAL HIGH (ref 65–99)
Glucose-Capillary: 136 mg/dL — ABNORMAL HIGH (ref 65–99)

## 2016-02-25 LAB — PROTIME-INR
INR: 1.24 (ref 0.00–1.49)
Prothrombin Time: 15.7 seconds — ABNORMAL HIGH (ref 11.6–15.2)

## 2016-02-25 MED ORDER — VITAL 1.5 CAL PO LIQD
1000.0000 mL | ORAL | Status: DC
  Filled 2016-02-25: qty 1000

## 2016-02-25 MED ORDER — HEPARIN (PORCINE) IN NACL 100-0.45 UNIT/ML-% IJ SOLN
1100.0000 [IU]/h | INTRAMUSCULAR | Status: DC
  Administered 2016-02-25: 900 [IU]/h via INTRAVENOUS
  Administered 2016-02-26 – 2016-02-27 (×2): 1000 [IU]/h via INTRAVENOUS
  Administered 2016-03-01 – 2016-03-02 (×2): 1100 [IU]/h via INTRAVENOUS
  Filled 2016-02-25 (×4): qty 250

## 2016-02-25 MED ORDER — HYDROMORPHONE HCL 1 MG/ML IJ SOLN: 0.5000 mg | Freq: Once | INTRAMUSCULAR | Status: DC

## 2016-02-25 MED ORDER — HYDROMORPHONE HCL 1 MG/ML IJ SOLN
0.5000 mg | Freq: Once | INTRAMUSCULAR | Status: AC
  Administered 2016-02-25: 0.5 mg via INTRAVENOUS
  Filled 2016-02-25: qty 1

## 2016-02-25 MED ORDER — FAT EMULSION 20 % IV EMUL
240.0000 mL | INTRAVENOUS | Status: AC
  Administered 2016-02-25: 240 mL via INTRAVENOUS
  Filled 2016-02-25: qty 250

## 2016-02-25 MED ORDER — PROMETHAZINE HCL 25 MG/ML IJ SOLN
12.5000 mg | Freq: Once | INTRAMUSCULAR | Status: AC
  Administered 2016-02-25: 12.5 mg via INTRAVENOUS
  Filled 2016-02-25: qty 1

## 2016-02-25 MED ORDER — CLINIMIX E/DEXTROSE (5/15) 5 % IV SOLN
INTRAVENOUS | Status: AC
  Administered 2016-02-25: 18:00:00 via INTRAVENOUS
  Filled 2016-02-25: qty 1680

## 2016-02-25 MED ORDER — VITAL 1.5 CAL PO LIQD
1000.0000 mL | ORAL | Status: DC
  Administered 2016-02-25: 1000 mL
  Filled 2016-02-25: qty 1000

## 2016-02-25 NOTE — Progress Notes (Signed)
Physical Therapy Treatment Patient Details Name: [name] MRN: [id number] DOB: [birth year] Today's Date: 02/25/2016    History of Present Illness [name] is a 82 y.o. female with medical history significant for but not limited to, atrial fibrillation, CKD, HTN, kidney cancer and gastric cancer. She is status post proximal gastrectomy early May for gastric adenocarcinoma. She was discharged home late May on twice daily PPI and Carafate. She was tolerating soft diet. Patient was readmitted several days later for nausea and vomiting. During that admission she underwent upper endoscopy with findings of grade D esophagitis and 2 small gastric ulcers at the anastomosis. She was given TNA during that admission.. Patient was discharged home 02/13/16 on twice a day PPI and metoclopramide but was unable to tolerate metoclopramide secondary to hives so instead took Maalox. Patient was compliant at home with twice daily PPI and antireflux measures but this am patient began vomiting again. She had been "spitting up" but that turned to vomiting this am. She has had burning discomfort in her throat and chest since last admission. No abdominal pain. Stool chronically loose and unchanged.     PT Comments    Pt reluctantly agreed to get up to recliner.  Family is present and supportive.  Once up, pt was appreciative.  Feel she will be ready to ambulate next session and pt and family in agreement.  Follow Up Recommendations  Home health PT;Supervision/Assistance - 24 hour     Equipment Recommendations  None recommended by PT    Recommendations for Other Services       Precautions / Restrictions Precautions Precautions: Fall Precaution Comments: J-tube Restrictions Weight Bearing Restrictions: No    Mobility  Bed Mobility Overal bed mobility: Needs Assistance Bed Mobility: Rolling;Sidelying to Sit Rolling: Min assist Sidelying to sit: Mod assist       General bed mobility  comments: Educated on body mechanics with bed mobility to decrease abd pressure/pain  Transfers Overall transfer level: Needs assistance Equipment used: Rolling walker (2 wheeled) Transfers: Sit to/from Stand Sit to Stand: Min assist         General transfer comment: Stood with MIN to power up with cues for hand placement.   Ambulation/Gait Ambulation/Gait assistance: Min guard Ambulation Distance (Feet): 2 Feet Assistive device: Rolling walker (2 wheeled) Gait Pattern/deviations: Step-to pattern     General Gait Details: Pt ambulated 2'  from bed to recliner with RW.   Stairs            Wheelchair Mobility    Modified Rankin (Stroke Patients Only)       Balance     Sitting balance-Leahy Scale: Good       Standing balance-Leahy Scale: Poor Standing balance comment: required UE support                    Cognition Arousal/Alertness: Awake/alert Behavior During Therapy: WFL for tasks assessed/performed Overall Cognitive Status: Within Functional Limits for tasks assessed                      Exercises General Exercises - Lower Extremity Ankle Circles/Pumps: AROM;Both    General Comments        Pertinent Vitals/Pain Pain Assessment: Faces Faces Pain Scale: Hurts even more Pain Location: abdomen Pain Descriptors / Indicators: Sore;Grimacing Pain Intervention(s): Limited activity within patient's tolerance;Monitored during session;Repositioned    Home Living  Prior Function            PT Goals (current goals can now be found in the care plan section) Acute Rehab PT Goals Patient Stated Goal: go home PT Goal Formulation: With patient/family Time For Goal Achievement: 03/08/16 Potential to Achieve Goals: Good Progress towards PT goals: Progressing toward goals    Frequency  Min 3X/week    PT Plan Current plan remains appropriate    Co-evaluation             End of Session   Activity  Tolerance: Patient limited by fatigue (HR 115 after transfer) Patient left: in chair;with call bell/phone within reach;with family/visitor present     Time: [id number] PT Time Calculation (min) (ACUTE ONLY): 16 min  Charges:  $Therapeutic Activity: 8-22 mins                    G Codes:      [name] 02/25/2016, 12:53 PM

## 2016-02-25 NOTE — Progress Notes (Signed)
Patient ID: [name], female   DOB: Nov 29, 1933, 82 y.o.   MRN: [id number]   LOS: 6 days   Subjective: Very sore from incision, not sure if the On-Q is doing anything. N/V yesterday and overnight, no flatus.   Objective: Vital signs in last 24 hours: Temp:  [98.1 F (36.7 C)-99 F (37.2 C)] 98.2 F (36.8 C) (06/21 0309) Pulse Rate:  [88-134] 105 (06/21 0309) Resp:  [17-19] 18 (06/20 1512) BP: (125-171)/(53-95) 133/66 mmHg (06/21 0309) SpO2:  [97 %-100 %] 97 % (06/21 0309) Weight:  [63.095 kg (139 lb 1.6 oz)] 63.095 kg (139 lb 1.6 oz) (06/21 0309) Last BM Date: 02/23/16   Laboratory  BMET  Recent Labs  02/24/16 0500 02/25/16 0500  NA 137 134*  K 3.4* 4.0  CL 103 104  CO2 25 23  GLUCOSE 114* 120*  BUN 26* 21*  CREATININE 0.81 0.64  CALCIUM 8.8* 8.4*   CBG (last 3)   Recent Labs  02/25/16 0013 02/25/16 0421 02/25/16 0824  GLUCAP 148* 125* 145*    Physical Exam General appearance: alert and no distress Resp: clear to auscultation bilaterally Cardio: irregularly irregular rhythm GI: Soft, absent BS, appropriately TTP   Assessment/Plan: S/p partial gastrectomy POD#1 s/p open j-tube placement -- Seems she has an ileus from surgery. Consider draining j-tube to gravity. Would hold off on TF until passing flatus.    Lisette Abu, PA-C Pager: [phone] 02/25/2016

## 2016-02-25 NOTE — Progress Notes (Signed)
Nutrition Follow-up  DOCUMENTATION CODES:   Non-severe (moderate) malnutrition in context of chronic illness  INTERVENTION:   -TPN management per pharmacy -RD will continue to follow for diet advancement and supplement as appropriate -Once TF is medically indicated, recommend initiating Vital 1.5 @ 20 ml/hr via J-tube and increase by 10 ml every 4 hours to goal rate of 45 ml/hr. Weaning TPN as TF rate advances.   Tube feeding regimen will provide 1620 kcal (100% of needs), 73 grams of protein, and 821 ml of H2O.   NUTRITION DIAGNOSIS:   Malnutrition related to chronic illness, altered GI function as evidenced by mild depletion of body fat, mild depletion of muscle mass, percent weight loss.  Ongoing  GOAL:   Patient will meet greater than or equal to 90% of their needs  Met with TPN  MONITOR:   Diet advancement, PO intake, Skin, I & O's, Labs, Weight trends  REASON FOR ASSESSMENT:   Consult Enteral/tube feeding initiation and management  ASSESSMENT:   82 y.o. female with a Past Medical History of PAF CAD, tachy/brady syndrome, RLS, GERD, uterine cancer and renal cancer, HTN, HLD, hypothyroidism, gastroparesis, Adenocarcinoma of the stomach s/p partial gastrectomy, ckd, and cva, who presents with nausea vomiting abdominal pain. This seems to be a recurrent issue for patient ever since she underwent partial gastrectomy.   S/p Procedure(s) 02/2116: OPEN J TUBE (N/A)  Pt remains on clear liquid diet with very minimal intake; noted 0% meal completion per doc flowsheets.   Pt continues to receive TPN via PICC (Clinimix E 5/15 at 78m/hr + IV lipids at 141mhr- provides 1672 kcal and 84 grams protein, which meets 100% of needs).   RD received consult for TF initiation and management, Per Dr. ByBarry Dienesplan to start trophic feeds today.   Case discussed with RN. Surgical team just evaluated pt and suspect she has an ileus. Pt has been experiencing nausea and vomiting. Plan to  hold off on TF until pt passes flatus.  Labs reviewed: Na: 134, CBGS: 125-148.   Diet Order:  TPN (CLINIMIX-E) Adult Diet clear liquid Room service appropriate?: Yes; Fluid consistency:: Thin  Skin:  Wound (see comment) (stage I pressure ulcer on sacrum)  Last BM:  6/18  Height:   Ht Readings from Last 1 Encounters:  02/17/16 '5\' 1"'$  (1.549 m)    Weight:   Wt Readings from Last 1 Encounters:  02/25/16 139 lb 1.6 oz (63.095 kg)    Ideal Body Weight:  47.7 kg  BMI:  Body mass index is 26.3 kg/(m^2).  Estimated Nutritional Needs:   Kcal:  1500-1700  Protein:  75-90 grams  Fluid:  1.5-1.7 L/day  EDUCATION NEEDS:   No education needs identified at this time  Renwick Asman A. WiJimmye NormanRD, LDN, CDE Pager: [phone] hours Pager: [phone]

## 2016-02-25 NOTE — Anesthesia Postprocedure Evaluation (Signed)
Anesthesia Post Note  Patient: [name]  Procedure(s) Performed: Procedure(s) (LRB): OPEN J TUBE (Left)  Patient location during evaluation: PACU Anesthesia Type: General Level of consciousness: awake and alert Pain management: pain level controlled Vital Signs Assessment: post-procedure vital signs reviewed and stable Respiratory status: spontaneous breathing, nonlabored ventilation, respiratory function stable and patient connected to nasal cannula oxygen Cardiovascular status: blood pressure returned to baseline and stable Postop Assessment: no signs of nausea or vomiting Anesthetic complications: no     Last Vitals:  Filed Vitals:   02/25/16 0020 02/25/16 0309  BP: 125/53 133/66  Pulse: 109 105  Temp: 37.1 C 36.8 C  Resp:      Last Pain:  Filed Vitals:   02/25/16 0310  PainSc: 0-No pain   Pain Goal: Patients Stated Pain Goal: 0 (02/21/16 1402)               Effie Berkshire

## 2016-02-25 NOTE — Progress Notes (Signed)
ANTICOAGULATION CONSULT NOTE - Initial Consult  Pharmacy Consult for heparin Indication: atrial fibrillation  Allergies  Allergen Reactions  . Amiodarone Other (See Comments)     Thyroid and liver and kidney problems  . Codeine Nausea And Vomiting  . Gabapentin Itching  . Hydrocodone Other (See Comments)    hallucination  . Morphine And Related Nausea And Vomiting  . Requip [Ropinirole Hcl]     Headache   . Zinc Nausea Only    nausea  . Reglan [Metoclopramide] Hives, Itching and Rash    Made face red, also    Patient Measurements: Height: _0  (154.9 cm) Weight: 139 lb 1.6 oz (63.095 kg) (a scale) IBW/kg (Calculated) : 47.8 Heparin Dosing Weight: 60kg  Vital Signs: Temp: 98.2 F (36.8 C) (06/21 0309) Temp Source: Oral (06/21 0309) BP: 133/66 mmHg (06/21 0309) Pulse Rate: 105 (06/21 0309)  Labs:  Recent Labs  02/23/16 0410 02/24/16 0500 02/25/16 0500  HGB 10.5*  --   --   HCT 33.1*  --   --   PLT 165  --   --   LABPROT 16.4* 16.5* 15.7*  INR 1.31 1.31 1.24  CREATININE 0.72 0.81 0.64    Estimated Creatinine Clearance: 47.7 mL/min (by C-G formula based on Cr of 0.64).   Medical History: Past Medical History  Diagnosis Date  . PAF (paroxysmal atrial fibrillation) ([place])   . CAD (coronary artery disease)   . Aortic root dilatation ([place])   . Moderate aortic stenosis   . Tachycardia-bradycardia syndrome ([place])     s/p PPM by Dr Doreatha Lew (MDT) 07/03/10  . GERD (gastroesophageal reflux disease)   . Neuropathy ([place])     feet/legs  . Restless leg syndrome   . History of uterine cancer 1980s    treated with hysterectomy, external radiation and radiation seed implants.   . Cancer ([place])     hx Kidney cancer / hx of endometrial cancer  . Anxiety   . HTN (hypertension)   . Hyperlipemia   . Hypothyroidism   . Osteomyelitis ([place]) as child  . Elevated LFTs     hepatic steatosis  . Gastroparesis   . Anal fissure   . Radiation proctitis   . Neuromuscular  disorder ([facility])     neuropathy in right leg  . Adenocarcinoma of stomach ([place]) 11/11/2015    gastric mass on egd  . CHF (congestive heart failure) ([place])   . Chronic kidney disease     only has one kidney rt lft rem 03 ca  . 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 Anemia     hx 3/17 iron infusion, on aranesp and injected B12 since 11/2015.   . Stroke ([place]) 15    partial stroke left legally blind    Medications:  Prescriptions prior to admission  Medication Sig Dispense Refill Last Dose  . acetaminophen (TYLENOL) 325 MG tablet Take 1 tablet (325 mg total) by mouth every 6 (six) hours as needed for mild pain, moderate pain, fever or headache. 20 tablet 0 02/15/2016 at am  . ALPRAZolam (XANAX) 0.5 MG tablet TAKE 1/2-1 TABLET BY MOUTH 3 TIMES A DAY AS NEEDED FOR ANXIETY 90 tablet 0 02/16/2016 at pm  . B Complex-C (B-COMPLEX WITH VITAMIN C) tablet Take 1 tablet by mouth daily. 30 tablet 3 02/16/2016 at am  . Biotin 5000 MCG CAPS Take 5,000 mcg by mouth every morning.    02/16/2016 at am  . Cholecalciferol (VITAMIN D PO) Take 5,000 Units by mouth every  morning.    02/16/2016 at am  . Cyanocobalamin 1000 MCG/ML KIT Inject 1,000 mcg into the skin as directed. (Patient taking differently: Inject 1,000 mcg into the skin every 30 (thirty) days. ) 6 kit 1 01/26/2016 at am  . diltiazem (CARDIZEM CD) 180 MG 24 hr capsule Take 1 capsule (180 mg total) by mouth daily. 30 capsule 0 02/16/2016 at am  . furosemide (LASIX) 40 MG tablet TAKE 1 TABLET (40 MG TOTAL) BY MOUTH DAILY. 30 tablet 0 02/16/2016 at am  . levothyroxine (SYNTHROID, LEVOTHROID) 100 MCG tablet TAKE 1/2 TABLET (50 MCG TOTAL) BY MOUTH EVERY DAY ON EMPTY STOMACH WITH A FULL GLASS OF WATER 90 tablet 1 02/17/2016 at am  . LYRICA 75 MG capsule TAKE ONE CAPSULE BY MOUTH TWICE A DAY 180 capsule 1 02/16/2016 at pm  . methocarbamol (ROBAXIN) 500 MG tablet Take 1 tablet (500 mg total) by mouth every 8 (eight) hours as needed for muscle spasms. 15 tablet 1 Past Month at Unknown  time  . metoprolol (LOPRESSOR) 100 MG tablet Take 1 tablet (100 mg total) by mouth 2 (two) times daily. 60 tablet 0 02/16/2016 at pm  . Multiple Vitamin (MULTIVITAMIN) tablet Take 1 tablet by mouth daily.   Past Month at Unknown time  . nitroGLYCERIN (NITROSTAT) 0.4 MG SL tablet Place 1 tablet (0.4 mg total) under the tongue every 5 (five) minutes as needed for chest pain (MAX 3 TABLETS). 25 tablet prn unk at unk  . ondansetron (ZOFRAN-ODT) 4 MG disintegrating tablet Take 1 tablet (4 mg total) by mouth every 6 (six) hours as needed for nausea. 20 tablet 0 02/17/2016 at a  . pantoprazole (PROTONIX) 40 MG tablet TAKE 1 TABLET (40 MG TOTAL) BY MOUTH 2 (TWO) TIMES DAILY. 60 tablet 3 02/16/2016 at pm  . potassium chloride SA (K-DUR,KLOR-CON) 20 MEQ tablet Take 20 mEq by mouth 2 (two) times daily.   02/16/2016 at pm  . sertraline (ZOLOFT) 50 MG tablet Take 1 tablet (50 mg total) by mouth daily. For mood 90 tablet 1 02/16/2016 at pm  . traMADol (ULTRAM) 50 MG tablet Take 1 tablet (50 mg total) by mouth every 6 (six) hours as needed (pain). Reported on 10/14/2015 60 tablet 1 Past Month at Unknown time  . warfarin (COUMADIN) 2 MG tablet TAKE 1 TABLET DAILY OR AS DIRECTED (Patient taking differently: Take 2 mg by mouth nightly (Sun/Mon/Tues/Wed/Thurs/Fri/Sat)) 100 tablet 1 02/16/2016 at pm  . cetirizine (ZYRTEC) 10 MG tablet Take 10 mg by mouth at bedtime.   02/15/2016 at pm   Scheduled:  . antiseptic oral rinse  7 mL Mouth Rinse BID  . fluconazole (DIFLUCAN) IV  200 mg Intravenous Q24H  . furosemide  20 mg Intravenous Daily  . insulin aspart  0-9 Units Subcutaneous Q4H  . levothyroxine  50 mcg Oral QAC breakfast  . lidocaine  1 patch Transdermal Q24H  . metoprolol  10 mg Intravenous Q6H  . nystatin  5 mL Oral QID  . pregabalin  75 mg Oral BID   Infusions:  . sodium chloride 10 mL/hr at 02/20/16 2216  . bupivacaine 0.5 % ON-Q pump DUAL CATH 300 mL    . Marland KitchenTPN (CLINIMIX-E) Adult 70 mL/hr at 02/24/16 1741   And   . fat emulsion 240 mL (02/24/16 1740)    Assessment: 82yo female admitted 6/13 w/ intractable N/V, on Coumadin PTA for PAF, had been on LMWH from admission to 6/19, d/c'd for surgery 6/20, now to begin heparin.  Goal  of Therapy:  Heparin level 0.3-0.7 units/ml Monitor platelets by anticoagulation protocol: Yes   Plan:  Will begin heparin gtt at 900 units/hr and monitor heparin levels and CBC.  Wynona Neat, PharmD, BCPS  02/25/2016,7:56 AM

## 2016-02-25 NOTE — Progress Notes (Signed)
Changed tube feed orders per MD to start at 10 ml/hr.

## 2016-02-25 NOTE — Progress Notes (Signed)
[facility] Progress Note  Patient: [name] [id number]   PCP: [name], MD DOB: 1933-09-10   DOA: 02/17/2016   DOS: 02/25/2016   Date of Service: the patient was seen and examined on 02/25/2016  Subjective: The patient continues to have some nausea. No episodes of vomiting. No abdominal pain. She complains about pain at the insertion shadow of the G-tube. No fever no chills. Nutrition: Tolerating breakfast this morning  Brief hospital course: Pt. with PMH of A. fib, security, HTN, gastric cancer S/P proximal gastrectomy 01/2016; admitted on 02/17/2016, with complaint of vomiting, was found to have suspected esophagitis. Patient was given IV Diflucan, IV PPI and was started back on TPN. Also started on IV erythromycin for motility. With initial treatment the patient was able to tolerate by mouth pills but no food intake and therefore it was decided to place a J-tube, procedure done on 02/24/2016 Currently further plan is gradually advance tube feeding and minimize dependence on TPN for nutrition.  Assessment and Plan: 1. Intractable nausea and vomiting from esophagitis. Gastric adenocarcinoma GERD with esophagitis Recent subtotal gastrectomy for gastric adenocarcinoma May 2017 Minimal oral intake by mouth despite improvement in nausea, S/P G-tube placement 02/24/2016. Has persistent nausea as well as pain at the incision site but no vomiting. Continue IV Pepcid, continue TPN via PICC line. Using the J-tube only for tube feeding at present Diflucan for thrush. Continue antireflux measures.  2. Paroxysmal A. fib. Essential hypertension, blood pressure well controlled Pacemaker implant  Chadsvasc 5. On warfarin  Currently rate controlled. Continue Cardizem when necessary, continue Lopressor scheduled. Was given Lovenox preprocedure, postprocedure I will resume on heparin for anticoagulation.  3. Hypothyroidism. On oral levothyroxine at present. We will  continue to monitor.  4. Dehydration. Mild renal insufficiency. Improvement with IV hydration. We will continue to monitor.  Pain management: When necessary Robaxin, when necessary fentanyl 1 lidocaine patch Activity: Consulted physical therapy, home health 24-hour supervision recommended Bowel regimen: last BM 02/23/2016 Diet: Clear liquid diet, continue tube feeding continue TPN DVT Prophylaxis: on therapeutic anticoagulation.  Advance goals of care discussion: DNR/DNI  Family Communication: family was present at bedside, at the time of interview. The pt provided permission to discuss medical plan with the family. Opportunity was given to ask question and all questions were answered satisfactorily.   Disposition:  Discharge to home with home health. Expected discharge date: 02/28/2016, pending improvement into feeding tolerance  Consultants: Gen. surgery Procedures: G-tube placement, PICC line placement, TPN  Antibiotics: Anti-infectives    Start     Dose/Rate Route Frequency Ordered Stop   02/24/16 1000  ceFAZolin (ANCEF) IVPB 2g/100 mL premix    Comments:  On call to OR.   2 g 200 mL/hr over 30 Minutes Intravenous To ShortStay Surgical 02/24/16 0848 02/24/16 1215   02/18/16 1000  fluconazole (DIFLUCAN) IVPB 200 mg     200 mg 100 mL/hr over 60 Minutes Intravenous Every 24 hours 02/18/16 0917     02/17/16 2200  erythromycin 250 mg in sodium chloride 0.9 % 100 mL IVPB  Status:  Discontinued     250 mg 100 mL/hr over 60 Minutes Intravenous Every 12 hours 02/17/16 1720 02/17/16 1853   02/17/16 2100  erythromycin 250 mg in sodium chloride 0.9 % 100 mL IVPB  Status:  Discontinued     250 mg 100 mL/hr over 60 Minutes Intravenous Every 12 hours 02/17/16 1853 02/20/16 0737        Intake/Output Summary (Last 24 hours) at  02/25/16 1737 Last data filed at 02/25/16 1645  Gross per 24 hour  Intake 2060.15 ml  Output   1925 ml  Net 135.15 ml   Filed Weights   02/23/16 0545  02/24/16 0411 02/25/16 0309  Weight: 60.691 kg (133 lb 12.8 oz) 61.372 kg (135 lb 4.8 oz) 63.095 kg (139 lb 1.6 oz)    Objective: Physical Exam: Filed Vitals:   02/24/16 2056 02/25/16 0020 02/25/16 0309 02/25/16 1300  BP: 130/66 125/53 133/66 127/66  Pulse: 114 109 105 107  Temp: 99 F (37.2 C) 98.8 F (37.1 C) 98.2 F (36.8 C) 99.4 F (37.4 C)  TempSrc: Oral Oral Oral Oral  Resp:    18  Height:      Weight:   63.095 kg (139 lb 1.6 oz)   SpO2: 98% 99% 97% 97%    General: Alert, Awake and Oriented to Time, Place and Person. Appear in mild distress Eyes: PERRL, Conjunctiva normal ENT: Oral Mucosa clear moist. Neck: no JVD, no Abnormal Mass Or lumps Cardiovascular: S1 and S2 Present, no Murmur, Respiratory: Bilateral Air entry equal and Decreased, Clear to Auscultation, no Crackles, no wheezes Abdomen: Bowel Sound present, Soft and mild tenderness Skin: no redness, no Rash  Extremities: no Pedal edema, no calf tenderness Neurologic: Grossly no focal neuro deficit. Bilaterally Equal motor strength  Data Reviewed: CBC:  Recent Labs Lab 02/19/16 0459 02/20/16 0455 02/22/16 0359 02/23/16 0410  WBC 5.8 7.2 10.5 10.3  NEUTROABS 3.2  --   --  7.1  HGB 12.1 12.5 11.5* 10.5*  HCT 38.3 38.3 35.5* 33.1*  MCV 92.7 92.3 92.0 92.7  PLT 167 169 178 [id number]   Basic Metabolic Panel:  Recent Labs Lab 02/19/16 0459 02/20/16 0455 02/21/16 0520 02/22/16 0359 02/23/16 0410 02/24/16 0500 02/25/16 0500  NA 140 137 136 133* 133* 137 134*  K 3.1* 3.7 3.0* 3.8 3.7 3.4* 4.0  CL 107 107 103 102 104 103 104  CO2 24 25 23 25  20* 25 23  GLUCOSE 117* 107* 152* 143* 120* 114* 120*  BUN 10 16 26* 27* 25* 26* 21*  CREATININE 0.88 0.82 0.75 0.75 0.72 0.81 0.64  CALCIUM 8.6* 8.7* 8.7* 8.6* 8.4* 8.8* 8.4*  MG 1.5* 1.8 2.0  --  1.7 1.8  --   PHOS 4.0 4.3 4.3  --  3.3 4.5  --     Liver Function Tests:  Recent Labs Lab 02/19/16 0459 02/23/16 0410  AST 20 21  ALT 14 13*  ALKPHOS 68 73    BILITOT 0.6 0.8  PROT 5.8* 5.5*  ALBUMIN 2.8* 2.6*   No results for input(s): LIPASE, AMYLASE in the last 168 hours. No results for input(s): AMMONIA in the last 168 hours. Coagulation Profile:  Recent Labs Lab 02/21/16 0520 02/22/16 0359 02/23/16 0410 02/24/16 0500 02/25/16 0500  INR 1.40 1.38 1.31 1.31 1.24   Cardiac Enzymes: No results for input(s): CKTOTAL, CKMB, CKMBINDEX, TROPONINI in the last 168 hours. BNP (last 3 results) No results for input(s): PROBNP in the last 8760 hours.  CBG:  Recent Labs Lab 02/25/16 0013 02/25/16 0421 02/25/16 0824 02/25/16 1241 02/25/16 1702  GLUCAP 148* 125* 145* 152* 109*    Studies: No results found.   Scheduled Meds: . antiseptic oral rinse  7 mL Mouth Rinse BID  . fluconazole (DIFLUCAN) IV  200 mg Intravenous Q24H  . insulin aspart  0-9 Units Subcutaneous Q4H  . levothyroxine  50 mcg Oral QAC breakfast  . lidocaine  1  patch Transdermal Q24H  . metoprolol  10 mg Intravenous Q6H  . nystatin  5 mL Oral QID  . pregabalin  75 mg Oral BID   Continuous Infusions: . sodium chloride 10 mL/hr at 02/20/16 2216  . bupivacaine 0.5 % ON-Q pump DUAL CATH 300 mL    . Marland KitchenTPN (CLINIMIX-E) Adult 70 mL/hr at 02/24/16 1741   And  . fat emulsion 240 mL (02/24/16 1740)  . Marland KitchenTPN (CLINIMIX-E) Adult     And  . fat emulsion    . feeding supplement (VITAL 1.5 CAL)    . heparin 900 Units/hr (02/25/16 0815)   PRN Meds: acetaminophen **OR** acetaminophen, alum & mag hydroxide-simeth, diltiazem, LORazepam, methocarbamol (ROBAXIN)  IV, ondansetron **OR** ondansetron (ZOFRAN) IV, sodium chloride flush, traMADol  Time spent: 30 minutes  Author: Berle Mull, MD [facility] Pager: [phone] 02/25/2016 5:37 PM  If 7PM-7AM, please contact night-coverage at [email or web address], password [facility]

## 2016-02-25 NOTE — Progress Notes (Signed)
[place] NOTE  Pharmacy Consult for TPN Indication: intolerance to enteral feeds  Allergies  Allergen Reactions  . Amiodarone Other (See Comments)     Thyroid and liver and kidney problems  . Codeine Nausea And Vomiting  . Gabapentin Itching  . Hydrocodone Other (See Comments)    hallucination  . Morphine And Related Nausea And Vomiting  . Requip [Ropinirole Hcl]     Headache   . Zinc Nausea Only    nausea  . Reglan [Metoclopramide] Hives, Itching and Rash    Made face red, also   Patient Measurements: Height: 5\' 1"  (154.9 cm) Weight: 139 lb 1.6 oz (63.095 kg) (a scale) IBW/kg (Calculated) : 47.8  Vital Signs: Temp: 98.2 F (36.8 C) (06/21 0309) Temp Source: Oral (06/21 0309) BP: 133/66 mmHg (06/21 0309) Pulse Rate: 105 (06/21 0309) Intake/Output from previous day: 06/20 0701 - 06/21 0700 In: 4890.2 [P.O.:210; I.V.:1428.3; IV Piggyback:565; TPN:2486.8] Out: 1250 [Urine:1225; Blood:25] Intake/Output from this shift:    Labs:  Recent Labs  02/23/16 0410 02/24/16 0500 02/25/16 0500  WBC 10.3  --   --   HGB 10.5*  --   --   HCT 33.1*  --   --   PLT 165  --   --   INR 1.31 1.31 1.24    Recent Labs  02/23/16 0410 02/24/16 0500 02/25/16 0500  NA 133* 137 134*  K 3.7 3.4* 4.0  CL 104 103 104  CO2 20* 25 23  GLUCOSE 120* 114* 120*  BUN 25* 26* 21*  CREATININE 0.72 0.81 0.64  CALCIUM 8.4* 8.8* 8.4*  MG 1.7 1.8  --   PHOS 3.3 4.5  --   PROT 5.5*  --   --   ALBUMIN 2.6*  --   --   AST 21  --   --   ALT 13*  --   --   ALKPHOS 73  --   --   BILITOT 0.8  --   --   PREALBUMIN 12.9*  --   --   TRIG 76  --   --    Estimated Creatinine Clearance: 47.7 mL/min (by C-G formula based on Cr of 0.64).    Recent Labs  02/25/16 0013 02/25/16 0421 02/25/16 0824  GLUCAP 148* 125* 145*   Insulin Requirements in the past 24 hours:  4 units on SSI sensitive q4h  Admit: 19 YOF with history of kidney and gastric cancer, s/p proximal  gastrectomy in early May. She was readmitted earlier in June with unretractable N/V and was on TPN at that time. This was discontinued prior to her going home- confirmed with Wynona Luna from [facility].   Surgeries/Procedures: 6/20: Jtube placed  GI: GERD / anal fissure / gastric cancer. EGD during last admission showed esophagitis, gastric ulcer at gastric anastomosis, was to be on liquids only for 2 weeks at home. D/c erythromycin for gastroparesis. N/V with ice chips. Prealbumin 19.7>12.9. Jtube placement 6/20 - per Surgery, hold off on TF until BS+, may have ileus? Diet: clears. Tolerating pills, still poor PO intake -Pepcid, zofran PRN  Endo: hypothyroid on Synthroid back to PO No hx DM. CBGs 120-154 on SSI  Lytes: Na 134, K 4 s/p 1 k run, Mag 1.8, Phos up 4.5, CorCa ~9.3  Renal: SCr 0.81 stable, CrCl ~40-72mL/min, good UOP  Pulm: RA  Cards: PAF / CAD / AS / tachy-brady syndrome post PPM / HTN / HLD / CHF, Afib. lasix, Lopressor, diltiazem, LMWH  Hepatobil: LFTs wnl, albumin 2.6, TG 181>76  Neuro: neuropathy / RLS / migraine / anxiety / hx CVA. - Pregabalin, robaxin prn  ID: Pressure ulcer identified - esophagitis - Fluconazole, Nystatin and Erythromycin solution - afebrile, WBC wnl  Best Practices: Lovenox (holding warfarin), MC  TPN Access: PICC placed 6/14 TPN start date: 6/14>>  Current Nutrition:  -Clinimix E 5/15 at 58mL/hr + IV lipids at 38mL/hr- this provides 84g protein and 1672kcal which meets 100% of needs -Clear liquid diet  Nutritional Goals: (per RD recommendations 6/14) 1500-1700 kCal, 75-90 grams of protein per day  Plan:  -Continue Clinimix E 5/15 at 54mL/hr + IV lipids at 63mL/hr- provides 84g protein and 1672kcal which meets 100% of needs -MVI and trace elements in TPN -Pepcid 40mg  in TPN -Continue sensitive SSI and CBGs q4h -F/u TPN labs in the AM -F/u TF start/toleration and ability to wean TPN  Elicia Lamp, PharmD, BCPS Clinical  Pharmacist Pager [phone] 02/25/2016 8:49 AM

## 2016-02-25 NOTE — Telephone Encounter (Signed)
LMOVM reminding pt to send remote transmission.   

## 2016-02-26 LAB — GLUCOSE, CAPILLARY
GLUCOSE-CAPILLARY: 110 mg/dL — AB (ref 65–99)
GLUCOSE-CAPILLARY: 121 mg/dL — AB (ref 65–99)
GLUCOSE-CAPILLARY: 139 mg/dL — AB (ref 65–99)
Glucose-Capillary: 124 mg/dL — ABNORMAL HIGH (ref 65–99)
Glucose-Capillary: 97 mg/dL (ref 65–99)
Glucose-Capillary: 127 mg/dL — ABNORMAL HIGH (ref 65–99)

## 2016-02-26 LAB — COMPREHENSIVE METABOLIC PANEL
ALK PHOS: 81 U/L (ref 38–126)
ALT: 13 U/L — ABNORMAL LOW (ref 14–54)
ANION GAP: 6 (ref 5–15)
AST: 23 U/L (ref 15–41)
Albumin: 2.1 g/dL — ABNORMAL LOW (ref 3.5–5.0)
BILIRUBIN TOTAL: 0.7 mg/dL (ref 0.3–1.2)
BUN: 21 mg/dL — ABNORMAL HIGH (ref 6–20)
CALCIUM: 8.4 mg/dL — AB (ref 8.9–10.3)
CO2: 22 mmol/L (ref 22–32)
Chloride: 107 mmol/L (ref 101–111)
Creatinine, Ser: 0.69 mg/dL (ref 0.44–1.00)
Glucose, Bld: 106 mg/dL — ABNORMAL HIGH (ref 65–99)
Potassium: 4 mmol/L (ref 3.5–5.1)
Sodium: 135 mmol/L (ref 135–145)
TOTAL PROTEIN: 5 g/dL — AB (ref 6.5–8.1)

## 2016-02-26 LAB — HEMOGLOBIN AND HEMATOCRIT, BLOOD
HEMATOCRIT: 27 % — AB (ref 36.0–46.0)
Hemoglobin: 8.6 g/dL — ABNORMAL LOW (ref 12.0–15.0)

## 2016-02-26 LAB — HEPARIN LEVEL (UNFRACTIONATED)
HEPARIN UNFRACTIONATED: 0.17 [IU]/mL — AB (ref 0.30–0.70)
HEPARIN UNFRACTIONATED: 0.48 [IU]/mL (ref 0.30–0.70)
Heparin Unfractionated: 0.42 IU/mL (ref 0.30–0.70)

## 2016-02-26 LAB — CBC
HCT: 27.3 % — ABNORMAL LOW (ref 36.0–46.0)
Hemoglobin: 8.4 g/dL — ABNORMAL LOW (ref 12.0–15.0)
MCH: 28.7 pg (ref 26.0–34.0)
MCHC: 30.8 g/dL (ref 30.0–36.0)
MCV: 93.2 fL (ref 78.0–100.0)
Platelets: 181 10*3/uL (ref 150–400)
RBC: 2.93 MIL/uL — ABNORMAL LOW (ref 3.87–5.11)
RDW: 16.6 % — AB (ref 11.5–15.5)
WBC: 9.8 10*3/uL (ref 4.0–10.5)

## 2016-02-26 LAB — PHOSPHORUS: PHOSPHORUS: 3.8 mg/dL (ref 2.5–4.6)

## 2016-02-26 LAB — PROTIME-INR
INR: 1.34 (ref 0.00–1.49)
Prothrombin Time: 16.7 seconds — ABNORMAL HIGH (ref 11.6–15.2)

## 2016-02-26 LAB — MAGNESIUM: Magnesium: 1.7 mg/dL (ref 1.7–2.4)

## 2016-02-26 MED ORDER — TRACE MINERALS CR-CU-MN-SE-ZN 10-1000-500-60 MCG/ML IV SOLN
INTRAVENOUS | Status: AC
  Administered 2016-02-26: 17:00:00 via INTRAVENOUS
  Filled 2016-02-26: qty 1680

## 2016-02-26 MED ORDER — MAGNESIUM SULFATE 2 GM/50ML IV SOLN
2.0000 g | Freq: Once | INTRAVENOUS | Status: AC
  Administered 2016-02-26: 2 g via INTRAVENOUS
  Filled 2016-02-26: qty 50

## 2016-02-26 MED ORDER — VITAL 1.5 CAL PO LIQD
1000.0000 mL | ORAL | Status: DC
  Filled 2016-02-26 (×3): qty 1000

## 2016-02-26 MED ORDER — FAT EMULSION 20 % IV EMUL
240.0000 mL | INTRAVENOUS | Status: AC
  Administered 2016-02-26: 240 mL via INTRAVENOUS
  Filled 2016-02-26: qty 250

## 2016-02-26 NOTE — Progress Notes (Signed)
ANTICOAGULATION CONSULT NOTE - Follow Up Consult  Pharmacy Consult for heparin Indication: atrial fibrillation   Labs:  Recent Labs  02/23/16 0410 02/24/16 0500 02/25/16 0500 02/25/16 2335  HGB 10.5*  --   --   --   HCT 33.1*  --   --   --   PLT 165  --   --   --   LABPROT 16.4* 16.5* 15.7*  --   INR 1.31 1.31 1.24  --   HEPARINUNFRC  --   --   --  0.48  CREATININE 0.72 0.81 0.64  --     Assessment/Plan:  82yo female therapeutic on heparin with initial dosing for Afib. Will continue gtt at current rate and confirm stable with am labs.   Wynona Neat, PharmD, BCPS  02/26/2016,12:20 AM

## 2016-02-26 NOTE — Progress Notes (Signed)
ANTICOAGULATION CONSULT NOTE - Follow Up Consult  Pharmacy Consult for heparin Indication: atrial fibrillation   Labs:  Recent Labs  02/24/16 0500 02/25/16 0500 02/25/16 2335 02/26/16 0518 02/26/16 1838 02/26/16 1955  HGB  --   --   --  8.4* 8.6*  --   HCT  --   --   --  27.3* 27.0*  --   PLT  --   --   --  181  --   --   LABPROT 16.5* 15.7*  --  16.7*  --   --   INR 1.31 1.24  --  1.34  --   --   HEPARINUNFRC  --   --  0.48 0.17*  --  0.42  CREATININE 0.81 0.64  --  0.69  --   --     Assessment: 82yo female now therapeutic on heparin  Goal of Therapy:  Heparin level 0.3-0.7 units/ml   Plan:  Continue heparin at 1000 units / hr Follow up AM labs  Thank you Anette Guarneri, PharmD [phone] 02/26/2016,8:37 PM

## 2016-02-26 NOTE — Progress Notes (Signed)
ANTICOAGULATION CONSULT NOTE - Follow Up Consult  Pharmacy Consult for heparin Indication: atrial fibrillation   Labs:  Recent Labs  02/24/16 0500 02/25/16 0500 02/25/16 2335 02/26/16 0518  HGB  --   --   --  8.4*  HCT  --   --   --  27.3*  PLT  --   --   --  181  LABPROT 16.5* 15.7*  --  16.7*  INR 1.31 1.24  --  1.34  HEPARINUNFRC  --   --  0.48 0.17*  CREATININE 0.81 0.64  --   --     Assessment: 82yo female now subtherapeutic on heparin after one level at goal; RN reports no gtt issues or interruptions overnight.  Goal of Therapy:  Heparin level 0.3-0.7 units/ml   Plan:  Will increase heparin gtt by ~2 units/kg/hr to 1000 units/hr and check level in [place], PharmD, BCPS  02/26/2016,6:24 AM

## 2016-02-26 NOTE — Progress Notes (Signed)
Patient ID: [name], female   DOB: 1934/05/22, 82 y.o.   MRN: [id number]   LOS: 7 days   Subjective: Emesis last night after ~5h of TF @10ml /h   Objective: Vital signs in last 24 hours: Temp:  [97.5 F (36.4 C)-99.4 F (37.4 C)] 99.1 F (37.3 C) (06/22 0436) Pulse Rate:  [107-109] 109 (06/22 0436) Resp:  [18] 18 (06/22 0436) BP: (125-147)/(58-66) 125/58 mmHg (06/22 0436) SpO2:  [97 %-100 %] 98 % (06/22 0436) Last BM Date: 02/23/16   Laboratory  CBC  Recent Labs  02/26/16 0518  WBC 9.8  HGB 8.4*  HCT 27.3*  PLT 181   BMET  Recent Labs  02/25/16 0500 02/26/16 0518  NA 134* 135  K 4.0 4.0  CL 104 107  CO2 23 22  GLUCOSE 120* 106*  BUN 21* 21*  CREATININE 0.64 0.69  CALCIUM 8.4* 8.4*    Physical Exam General appearance: alert and no distress Resp: clear to auscultation bilaterally Cardio: irregularly irregular rhythm GI: Soft, absent BS   Assessment/Plan: S/p partial gastrectomy POD#2 s/p open j-tube placement -- Seems she has an ileus from surgery. Consider draining j-tube to gravity. Would hold off on TF until passing flatus.    Lisette Abu, PA-C Pager: [phone] 02/26/2016

## 2016-02-26 NOTE — Progress Notes (Signed)
[place] NOTE  Pharmacy Consult for TPN Indication: intolerance to enteral feeds  Allergies  Allergen Reactions  . Amiodarone Other (See Comments)     Thyroid and liver and kidney problems  . Codeine Nausea And Vomiting  . Gabapentin Itching  . Hydrocodone Other (See Comments)    hallucination  . Morphine And Related Nausea And Vomiting  . Requip [Ropinirole Hcl]     Headache   . Zinc Nausea Only    nausea  . Reglan [Metoclopramide] Hives, Itching and Rash    Made face red, also   Patient Measurements: Height: 5\' 1"  (154.9 cm) Weight: 139 lb 1.6 oz (63.095 kg) (a scale) IBW/kg (Calculated) : 47.8  Vital Signs: Temp: 99.1 F (37.3 C) (06/22 0436) Temp Source: Oral (06/22 0436) BP: 125/58 mmHg (06/22 0436) Pulse Rate: 109 (06/22 0436) Intake/Output from previous day: 06/21 0701 - 06/22 0700 In: 2150.5 [I.V.:200.7; IV Piggyback:55; [id number] Out: 1295 [Urine:1175; Drains:120] Intake/Output from this shift:    Labs:  Recent Labs  02/24/16 0500 02/25/16 0500 02/26/16 0518  WBC  --   --  9.8  HGB  --   --  8.4*  HCT  --   --  27.3*  PLT  --   --  181  INR 1.31 1.24 1.34    Recent Labs  02/24/16 0500 02/25/16 0500 02/26/16 0518  NA 137 134* 135  K 3.4* 4.0 4.0  CL 103 104 107  CO2 25 23 22   GLUCOSE 114* 120* 106*  BUN 26* 21* 21*  CREATININE 0.81 0.64 0.69  CALCIUM 8.8* 8.4* 8.4*  MG 1.8  --  1.7  PHOS 4.5  --  3.8  PROT  --   --  5.0*  ALBUMIN  --   --  2.1*  AST  --   --  23  ALT  --   --  13*  ALKPHOS  --   --  81  BILITOT  --   --  0.7   Estimated Creatinine Clearance: 47.7 mL/min (by C-G formula based on Cr of 0.69).    Recent Labs  02/25/16 2102 02/26/16 0012 02/26/16 0427  GLUCAP 136* 139* 124*   Insulin Requirements in the past 24 hours:  3 units on SSI sensitive q4h  Admit: 75 YOF with history of kidney and gastric cancer, s/p proximal gastrectomy in early May. She was readmitted earlier in June with  unretractable N/V and was on TPN at that time. This was discontinued prior to her going home- confirmed with Wynona Luna from [facility].   Surgeries/Procedures: 6/20: Jtube placed  GI: GERD / anal fissure / gastric cancer. EGD during last admission showed esophagitis, gastric ulcer at gastric anastomosis, was to be on liquids only for 2 weeks at home. D/c erythromycin for gastroparesis. N/V with ice chips. Prealbumin 19.7>12.9.Diet: clears. Tolerating pills, still poor PO intake.  Jtube placement 6/20 - per Surgery, hold off on TF (started, then d/c'd w/ vomiting after 5h TF at 58ml/h) until BS+, ileus noted post-op.  -Pepcid, zofran PRN  Endo: hypothyroid on Synthroid back to PO No hx DM. CBGs 106-152 on SSI  Lytes: K 4 (goal>/=4 w/ ileus), Mag 1.7 (goal>/=2 w/ ileus), Phos 3.8, CorCa ~9.8  Renal: SCr 0.69 stable, CrCl ~40-77mL/min, good UOP  Pulm: RA  Cards: PAF / CAD / AS / tachy-brady syndrome post PPM / HTN / HLD / CHF, Afib. lasix, Lopressor, diltiazem, LMWH  Hepatobil: LFTs wnl, albumin 2.1, TG 181>76  Neuro: neuropathy / RLS / migraine / anxiety / hx CVA. - Pregabalin, robaxin prn  ID: Pressure ulcer identified - esophagitis - Fluconazole, Nystatin and Erythromycin solution - afebrile, WBC wnl  Best Practices: Lovenox (holding warfarin), MC  TPN Access: PICC placed 6/14 TPN start date: 6/14>>  Current Nutrition:  -Clinimix E 5/15 at 64mL/hr + IV lipids at 20mL/hr- this provides 84g protein and 1672kcal which meets 100% of needs -Clear liquid diet  Nutritional Goals: (per RD recommendations 6/14) 1500-1700 kCal, 75-90 grams of protein per day  Plan:  -Continue Clinimix E 5/15 at 48mL/hr + IV lipids at 64mL/hr- provides 84g protein and 1672kcal which meets 100% of needs -MVI and trace elements in TPN -Pepcid 40mg  in TPN -Continue sensitive SSI and CBGs q4h -Mag sulfate 2g IV x1 -F/u labs in the AM -F/u TF start/toleration and ability to wean TPN  Elicia Lamp, PharmD, BCPS Clinical Pharmacist Pager [phone] 02/26/2016 8:22 AM

## 2016-02-26 NOTE — Progress Notes (Signed)
[facility] Progress Note  Patient: [name] [id number]   PCP: [name], MD DOB: 1934-09-05   DOA: 02/17/2016   DOS: 02/26/2016   Date of Service: the patient was seen and examined on 02/26/2016  Subjective: Continues to have nausea as well as abdominal pain. No fever no chills. Also had episode of vomiting last night of green bile. Nutrition: Unable to tolerate tube feeding at present  Brief hospital course: Pt. with PMH of A. fib, security, HTN, gastric cancer S/P proximal gastrectomy 01/2016; admitted on 02/17/2016, with complaint of vomiting, was found to have suspected esophagitis. Patient was given IV Diflucan, IV PPI and was started back on TPN. Also started on IV erythromycin for motility. With initial treatment the patient was able to tolerate by mouth pills but no food intake and therefore it was decided to place a J-tube, procedure done on 02/24/2016 Currently further plan is gradually advance tube feeding and minimize dependence on TPN for nutrition.  Assessment and Plan: 1. Intractable nausea and vomiting from esophagitis. Gastric adenocarcinoma GERD with esophagitis Recent subtotal gastrectomy for gastric adenocarcinoma May 2017 Minimal oral intake by mouth despite improvement in nausea, S/P G-tube placement 02/24/2016. Has persistent nausea as well as pain at the incision site but no vomiting. Continue IV Pepcid, continue TPN via PICC line. On clear liquid diet, G-tube to gravity only at present than holding off on tube feeding until passing gas. Diflucan for thrush, for a total 14 days, last day 03/02/2016 Continue antireflux measures.  2. Paroxysmal A. fib. Essential hypertension, blood pressure well controlled Pacemaker implant  Chadsvasc 5. On warfarin  Currently rate controlled. Continue Cardizem when necessary, continue Lopressor scheduled. Was given Lovenox preprocedure, postprocedure on heparin for anticoagulation.  3.  Hypothyroidism. On oral levothyroxine at present. We will continue to monitor.  4. Dehydration. Mild renal insufficiency. Improvement with IV hydration. We will continue to monitor.  5. Postoperative blood loss anemia. Patient's hemoglobin dropped from 12-8.9. No evidence of active bleeding. Patient is 4 L positive between these 2 CBC check. At present I suspect that this is more hemodilution than a postoperative blood loss anemia. I will continue to monitor CBC closely. Transfuse for hemoglobin less than 7.  Pain management: When necessary Robaxin, when necessary fentanyl, lidocaine patch Activity: Consulted physical therapy, home health 24-hour supervision recommended Bowel regimen: last BM 02/23/2016 Diet: Clear liquid diet, hold tube feeding continue TPN DVT Prophylaxis: on therapeutic anticoagulation.  Advance goals of care discussion: DNR/DNI  Family Communication: family was present at bedside, at the time of interview. The pt provided permission to discuss medical plan with the family. Opportunity was given to ask question and all questions were answered satisfactorily.   Disposition:  Discharge to home with home health. Expected discharge date: 03/01/2016, pending improvement feeding tolerance  Consultants: Gen. surgery Procedures: G-tube placement, PICC line placement, TPN  Antibiotics: Anti-infectives    Start     Dose/Rate Route Frequency Ordered Stop   02/24/16 1000  ceFAZolin (ANCEF) IVPB 2g/100 mL premix    Comments:  On call to OR.   2 g 200 mL/hr over 30 Minutes Intravenous To ShortStay Surgical 02/24/16 0848 02/24/16 1215   02/18/16 1000  fluconazole (DIFLUCAN) IVPB 200 mg     200 mg 100 mL/hr over 60 Minutes Intravenous Every 24 hours 02/18/16 0917     02/17/16 2200  erythromycin 250 mg in sodium chloride 0.9 % 100 mL IVPB  Status:  Discontinued     250 mg 100  mL/hr over 60 Minutes Intravenous Every 12 hours 02/17/16 1720 02/17/16 1853   02/17/16 2100   erythromycin 250 mg in sodium chloride 0.9 % 100 mL IVPB  Status:  Discontinued     250 mg 100 mL/hr over 60 Minutes Intravenous Every 12 hours 02/17/16 1853 02/20/16 0737        Intake/Output Summary (Last 24 hours) at 02/26/16 1729 Last data filed at 02/26/16 1602  Gross per 24 hour  Intake 2270.54 ml  Output   1495 ml  Net 775.54 ml   Filed Weights   02/23/16 0545 02/24/16 0411 02/25/16 0309  Weight: 60.691 kg (133 lb 12.8 oz) 61.372 kg (135 lb 4.8 oz) 63.095 kg (139 lb 1.6 oz)    Objective: Physical Exam: Filed Vitals:   02/25/16 1300 02/25/16 2133 02/26/16 0436 02/26/16 1257  BP: 127/66 147/65 125/58 144/67  Pulse: 107 107 109 92  Temp: 99.4 F (37.4 C) 97.5 F (36.4 C) 99.1 F (37.3 C) 99.1 F (37.3 C)  TempSrc: Oral Oral Oral Oral  Resp: 18 18 18 18   Height:      Weight:      SpO2: 97% 100% 98% 100%    General: Alert, Awake and Oriented to Time, Place and Person. Appear in mild distress Eyes: PERRL, Conjunctiva normal ENT: Oral Mucosa clear moist. Neck: no JVD, no Abnormal Mass Or lumps Cardiovascular: S1 and S2 Present, no Murmur, Respiratory: Bilateral Air entry equal and Decreased, Clear to Auscultation, no Crackles, no wheezes Abdomen: Bowel Sound present, Soft and mild tenderness Skin: no redness, no Rash  Extremities: no Pedal edema, no calf tenderness Neurologic: Grossly no focal neuro deficit. Bilaterally Equal motor strength  Data Reviewed: CBC:  Recent Labs Lab 02/20/16 0455 02/22/16 0359 02/23/16 0410 02/26/16 0518  WBC 7.2 10.5 10.3 9.8  NEUTROABS  --   --  7.1  --   HGB 12.5 11.5* 10.5* 8.4*  HCT 38.3 35.5* 33.1* 27.3*  MCV 92.3 92.0 92.7 93.2  PLT 169 178 165 0000000   Basic Metabolic Panel:  Recent Labs Lab 02/20/16 0455 02/21/16 0520 02/22/16 0359 02/23/16 0410 02/24/16 0500 02/25/16 0500 02/26/16 0518  NA 137 136 133* 133* 137 134* 135  K 3.7 3.0* 3.8 3.7 3.4* 4.0 4.0  CL 107 103 102 104 103 104 107  CO2 25 23 25  20*  25 23 22   GLUCOSE 107* 152* 143* 120* 114* 120* 106*  BUN 16 26* 27* 25* 26* 21* 21*  CREATININE 0.82 0.75 0.75 0.72 0.81 0.64 0.69  CALCIUM 8.7* 8.7* 8.6* 8.4* 8.8* 8.4* 8.4*  MG 1.8 2.0  --  1.7 1.8  --  1.7  PHOS 4.3 4.3  --  3.3 4.5  --  3.8    Liver Function Tests:  Recent Labs Lab 02/23/16 0410 02/26/16 0518  AST 21 23  ALT 13* 13*  ALKPHOS 73 81  BILITOT 0.8 0.7  PROT 5.5* 5.0*  ALBUMIN 2.6* 2.1*   No results for input(s): LIPASE, AMYLASE in the last 168 hours. No results for input(s): AMMONIA in the last 168 hours. Coagulation Profile:  Recent Labs Lab 02/22/16 0359 02/23/16 0410 02/24/16 0500 02/25/16 0500 02/26/16 0518  INR 1.38 1.31 1.31 1.24 1.34   Cardiac Enzymes: No results for input(s): CKTOTAL, CKMB, CKMBINDEX, TROPONINI in the last 168 hours. BNP (last 3 results) No results for input(s): PROBNP in the last 8760 hours.  CBG:  Recent Labs Lab 02/26/16 0012 02/26/16 0427 02/26/16 0825 02/26/16 1255 02/26/16 1701  GLUCAP 139* 124* 121* 110* 97    Studies: No results found.   Scheduled Meds: . antiseptic oral rinse  7 mL Mouth Rinse BID  . fluconazole (DIFLUCAN) IV  200 mg Intravenous Q24H  . insulin aspart  0-9 Units Subcutaneous Q4H  . levothyroxine  50 mcg Oral QAC breakfast  . lidocaine  1 patch Transdermal Q24H  . metoprolol  10 mg Intravenous Q6H  . nystatin  5 mL Oral QID  . pregabalin  75 mg Oral BID   Continuous Infusions: . sodium chloride 10 mL/hr at 02/20/16 2216  . bupivacaine 0.5 % ON-Q pump DUAL CATH 300 mL    . Marland KitchenTPN (CLINIMIX-E) Adult Stopped (02/26/16 1711)   And  . fat emulsion Stopped (02/26/16 1711)  . Marland KitchenTPN (CLINIMIX-E) Adult 70 mL/hr at 02/26/16 1705   And  . fat emulsion 240 mL (02/26/16 1705)  . feeding supplement (VITAL 1.5 CAL)    . heparin 1,000 Units/hr (02/26/16 1150)   PRN Meds: acetaminophen **OR** acetaminophen, alum & mag hydroxide-simeth, diltiazem, LORazepam, methocarbamol (ROBAXIN)  IV,  ondansetron **OR** ondansetron (ZOFRAN) IV, sodium chloride flush, traMADol  Time spent: 30 minutes  Author: Berle Mull, MD [facility] Pager: [phone] 02/26/2016 5:29 PM  If 7PM-7AM, please contact night-coverage at [email or web address], password [facility]

## 2016-02-27 ENCOUNTER — Telehealth: Payer: Self-pay | Admitting: Cardiology

## 2016-02-27 ENCOUNTER — Encounter (HOSPITAL_COMMUNITY): Payer: Self-pay | Admitting: Internal Medicine

## 2016-02-27 ENCOUNTER — Encounter: Payer: Self-pay | Admitting: Cardiology

## 2016-02-27 DIAGNOSIS — Z789 Other specified health status: Secondary | ICD-10-CM | POA: Diagnosis present

## 2016-02-27 LAB — GLUCOSE, CAPILLARY
GLUCOSE-CAPILLARY: 110 mg/dL — AB (ref 65–99)
GLUCOSE-CAPILLARY: 114 mg/dL — AB (ref 65–99)
Glucose-Capillary: 118 mg/dL — ABNORMAL HIGH (ref 65–99)
Glucose-Capillary: 115 mg/dL — ABNORMAL HIGH (ref 65–99)
Glucose-Capillary: 124 mg/dL — ABNORMAL HIGH (ref 65–99)

## 2016-02-27 LAB — CBC
HCT: 25 % — ABNORMAL LOW (ref 36.0–46.0)
Hemoglobin: 8 g/dL — ABNORMAL LOW (ref 12.0–15.0)
MCH: 29.6 pg (ref 26.0–34.0)
MCHC: 32 g/dL (ref 30.0–36.0)
MCV: 92.6 fL (ref 78.0–100.0)
PLATELETS: 196 10*3/uL (ref 150–400)
RBC: 2.7 MIL/uL — ABNORMAL LOW (ref 3.87–5.11)
RDW: 16.9 % — AB (ref 11.5–15.5)
WBC: 7.1 10*3/uL (ref 4.0–10.5)

## 2016-02-27 LAB — HEPARIN LEVEL (UNFRACTIONATED): Heparin Unfractionated: 0.48 IU/mL (ref 0.30–0.70)

## 2016-02-27 LAB — PROTIME-INR
INR: 1.35 (ref 0.00–1.49)
PROTHROMBIN TIME: 16.8 s — AB (ref 11.6–15.2)

## 2016-02-27 LAB — BASIC METABOLIC PANEL
Anion gap: 5 (ref 5–15)
BUN: 21 mg/dL — AB (ref 6–20)
CHLORIDE: 108 mmol/L (ref 101–111)
CO2: 22 mmol/L (ref 22–32)
CREATININE: 0.69 mg/dL (ref 0.44–1.00)
Calcium: 8.1 mg/dL — ABNORMAL LOW (ref 8.9–10.3)
GFR calc Af Amer: >60 mL/min (ref >60)
GFR calc non Af Amer: >60 mL/min (ref >60)
Glucose, Bld: 115 mg/dL — ABNORMAL HIGH (ref 65–99)
Potassium: 3.9 mmol/L (ref 3.5–5.1)
SODIUM: 135 mmol/L (ref 135–145)

## 2016-02-27 LAB — MAGNESIUM: Magnesium: 1.8 mg/dL (ref 1.7–2.4)

## 2016-02-27 LAB — PHOSPHORUS: Phosphorus: 4.4 mg/dL (ref 2.5–4.6)

## 2016-02-27 MED ORDER — DARBEPOETIN ALFA 200 MCG/0.4ML IJ SOSY
200.0000 ug | PREFILLED_SYRINGE | Freq: Once | INTRAMUSCULAR | Status: AC
  Administered 2016-02-27: 200 ug via SUBCUTANEOUS
  Filled 2016-02-27: qty 0.4

## 2016-02-27 MED ORDER — MAGNESIUM SULFATE 2 GM/50ML IV SOLN
2.0000 g | Freq: Once | INTRAVENOUS | Status: AC
Start: 2016-02-27 — End: 2016-02-27
  Administered 2016-02-27: 2 g via INTRAVENOUS
  Filled 2016-02-27: qty 50

## 2016-02-27 MED ORDER — FAT EMULSION 20 % IV EMUL
240.0000 mL | INTRAVENOUS | Status: AC
  Administered 2016-02-27: 240 mL via INTRAVENOUS
  Filled 2016-02-27: qty 250

## 2016-02-27 MED ORDER — TRACE MINERALS CR-CU-MN-SE-ZN 10-1000-500-60 MCG/ML IV SOLN
INTRAVENOUS | Status: AC
  Administered 2016-02-27: 17:00:00 via INTRAVENOUS
  Filled 2016-02-27: qty 1680

## 2016-02-27 MED ORDER — POTASSIUM CHLORIDE 10 MEQ/50ML IV SOLN
10.0000 meq | INTRAVENOUS | Status: AC
  Administered 2016-02-27 (×2): 10 meq via INTRAVENOUS
  Filled 2016-02-27 (×2): qty 50

## 2016-02-27 MED ORDER — SODIUM CHLORIDE 0.9 % IV SOLN
510.0000 mg | Freq: Once | INTRAVENOUS | Status: AC
  Administered 2016-02-27: 510 mg via INTRAVENOUS
  Filled 2016-02-27: qty 17

## 2016-02-27 MED ORDER — CYANOCOBALAMIN 1000 MCG/ML IJ SOLN
1000.0000 ug | Freq: Once | INTRAMUSCULAR | Status: AC
  Administered 2016-02-27: 1000 ug via SUBCUTANEOUS
  Filled 2016-02-27: qty 1

## 2016-02-27 NOTE — Progress Notes (Signed)
Patient is active with [place] as prior to admission and plans to return home when medically stable; B Pennie Rushing [phone]

## 2016-02-27 NOTE — Care Management Important Message (Signed)
Important Message  Patient Details  Name: [name] MRN: [id number] Date of Birth: 05-10-1934   Medicare Important Message Given:  Yes    Loann Quill 02/27/2016, 9:45 AM

## 2016-02-27 NOTE — Progress Notes (Signed)
Patient ID: [name], female   DOB: 08-24-1934, 82 y.o.   MRN: [id number]   LOS: 8 days   Subjective: Denies N/V/flatus   Objective: Vital signs in last 24 hours: Temp:  [98.9 F (37.2 C)-100.3 F (37.9 C)] 99.4 F (37.4 C) (06/23 0700) Pulse Rate:  [92-113] 109 (06/23 0700) Resp:  [18-19] 18 (06/23 0700) BP: (118-144)/(54-76) 142/76 mmHg (06/23 0700) SpO2:  [97 %-100 %] 100 % (06/23 0700) Weight:  [65.046 kg (143 lb 6.4 oz)] 65.046 kg (143 lb 6.4 oz) (06/23 0406) Last BM Date: 02/23/16   Laboratory  CBC  Recent Labs  02/26/16 0518 02/26/16 1838 02/27/16 0520  WBC 9.8  --  7.1  HGB 8.4* 8.6* 8.0*  HCT 27.3* 27.0* 25.0*  PLT 181  --  196   BMET  Recent Labs  02/26/16 0518 02/27/16 0520  NA 135 135  K 4.0 3.9  CL 107 108  CO2 22 22  GLUCOSE 106* 115*  BUN 21* 21*  CREATININE 0.69 0.69  CALCIUM 8.4* 8.1*    Physical Exam General appearance: alert and no distress Resp: clear to auscultation bilaterally Cardio: regular rate and rhythm GI: Soft, +BS slightly diminished   Assessment/Plan: S/p partial gastrectomy POD#2 s/p open j-tube placement -- Would hold off on TF until passing flatus.    Lisette Abu, PA-C Pager: [phone] 02/27/2016

## 2016-02-27 NOTE — Progress Notes (Addendum)
[facility] Progress Note  Patient: [name] [id number]   PCP: [name], MD DOB: 04-24-1934   DOA: 02/17/2016   DOS: 02/27/2016   Date of Service: the patient was seen and examined on 02/27/2016  Subjective: Nausea is better. No episodes of vomiting. Not passing gas not having any bowel movement. Nutrition: Unable to tolerate tube feeding at present  Brief hospital course: Pt. with PMH of A. fib, security, HTN, gastric cancer S/P proximal gastrectomy 01/2016; admitted on 02/17/2016, with complaint of vomiting, was found to have suspected esophagitis. Patient was given IV Diflucan, IV PPI and was started back on TPN. Also started on IV erythromycin for motility. With initial treatment the patient was able to tolerate by mouth pills but no food intake and therefore it was decided to place a J-tube, procedure done on 02/24/2016 Currently further plan is monitor for recovery from ileus  Assessment and Plan: 1. Gastroesophageal reflux disease with esophagitis from esophagitis. Gastric adenocarcinoma GERD with esophagitis Recent subtotal gastrectomy for gastric adenocarcinoma May 2017 Minimal oral intake by mouth despite improvement in nausea, S/P G-tube placement 02/24/2016. Has persistent nausea as well as pain at the incision site but no vomiting. Continue IV Pepcid, continue TPN via PICC line. On clear liquid diet, G-tube to gravity only at present , holding off on tube feeding until passing gas. Diflucan for thrush, for a total 14 days, last day 03/02/2016 Unsure the patient will benefit from Reglan or not. We will leave it up to surgery.  2. Paroxysmal A. fib. Essential hypertension, blood pressure well controlled Pacemaker implant  Chadsvasc 5. On warfarin  Currently rate controlled. Continue Cardizem when necessary, continue Lopressor scheduled. Was given Lovenox preprocedure, postprocedure on heparin for anticoagulation.  3. Hypothyroidism. On oral  levothyroxine at present. We will continue to monitor.  4. Dehydration. Mild renal insufficiency. Improvement with IV hydration. We will continue to monitor.  5. Postoperative blood loss anemia. Patient's hemoglobin dropped from 12-8.0. No evidence of active bleeding. Patient is 4 L positive between these 2 CBC check. Patient is refusing blood transfusion, we will provide IV iron as well as injection B-12. Discussed with hematology who recommends to do one dose of 200 g of EPO  Pain management: When necessary Robaxin, when necessary fentanyl, lidocaine patch Activity: Consulted physical therapy, home health 24-hour supervision recommended Bowel regimen: last BM 02/23/2016 Diet: Clear liquid diet, hold tube feeding continue TPN DVT Prophylaxis: on therapeutic anticoagulation.  Advance goals of care discussion: DNR/DNI  Family Communication: family was present at bedside, at the time of interview. The pt provided permission to discuss medical plan with the family. Opportunity was given to ask question and all questions were answered satisfactorily.   Disposition:  Discharge to home with home health. Expected discharge date: 03/01/2016, pending improvement feeding tolerance  Consultants: Gen. surgery Procedures: G-tube placement, PICC line placement, TPN  Antibiotics: Anti-infectives    Start     Dose/Rate Route Frequency Ordered Stop   02/24/16 1000  ceFAZolin (ANCEF) IVPB 2g/100 mL premix    Comments:  On call to OR.   2 g 200 mL/hr over 30 Minutes Intravenous To ShortStay Surgical 02/24/16 0848 02/24/16 1215   02/18/16 1000  fluconazole (DIFLUCAN) IVPB 200 mg     200 mg 100 mL/hr over 60 Minutes Intravenous Every 24 hours 02/18/16 0917     02/17/16 2200  erythromycin 250 mg in sodium chloride 0.9 % 100 mL IVPB  Status:  Discontinued     250 mg  100 mL/hr over 60 Minutes Intravenous Every 12 hours 02/17/16 1720 02/17/16 1853   02/17/16 2100  erythromycin 250 mg in sodium  chloride 0.9 % 100 mL IVPB  Status:  Discontinued     250 mg 100 mL/hr over 60 Minutes Intravenous Every 12 hours 02/17/16 1853 02/20/16 0737        Intake/Output Summary (Last 24 hours) at 02/27/16 1838 Last data filed at 02/27/16 1800  Gross per 24 hour  Intake   2120 ml  Output   2845 ml  Net   -725 ml   Filed Weights   02/24/16 0411 02/25/16 0309 02/27/16 0406  Weight: 61.372 kg (135 lb 4.8 oz) 63.095 kg (139 lb 1.6 oz) 65.046 kg (143 lb 6.4 oz)    Objective: Physical Exam: Filed Vitals:   02/27/16 0000 02/27/16 0406 02/27/16 0700 02/27/16 1552  BP: 126/59 123/56 142/76 141/64  Pulse: 99 94 109 100  Temp: 99.4 F (37.4 C) 98.9 F (37.2 C) 99.4 F (37.4 C) 97.4 F (36.3 C)  TempSrc: Oral Oral Oral Oral  Resp:  18 18 18   Height:      Weight:  65.046 kg (143 lb 6.4 oz)    SpO2:  97% 100% 100%    General: Alert, Awake and Oriented to Time, Place and Person. Appear in mild distress Eyes: PERRL, Conjunctiva normal ENT: Oral Mucosa clear moist. Neck: no JVD, no Abnormal Mass Or lumps Cardiovascular: S1 and S2 Present, no Murmur, Respiratory: Bilateral Air entry equal and Decreased, Clear to Auscultation, no Crackles, no wheezes Abdomen: Bowel Sound present, Soft and mild tenderness Skin: no redness, no Rash  Extremities: no Pedal edema, no calf tenderness Neurologic: Grossly no focal neuro deficit. Bilaterally Equal motor strength  Data Reviewed: CBC:  Recent Labs Lab 02/22/16 0359 02/23/16 0410 02/26/16 0518 02/26/16 1838 02/27/16 0520  WBC 10.5 10.3 9.8  --  7.1  NEUTROABS  --  7.1  --   --   --   HGB 11.5* 10.5* 8.4* 8.6* 8.0*  HCT 35.5* 33.1* 27.3* 27.0* 25.0*  MCV 92.0 92.7 93.2  --  92.6  PLT 178 165 181  --  123456   Basic Metabolic Panel:  Recent Labs Lab 02/21/16 0520  02/23/16 0410 02/24/16 0500 02/25/16 0500 02/26/16 0518 02/27/16 0520  NA 136  < > 133* 137 134* 135 135  K 3.0*  < > 3.7 3.4* 4.0 4.0 3.9  CL 103  < > 104 103 104 107 108   CO2 23  < > 20* 25 23 22 22   GLUCOSE 152*  < > 120* 114* 120* 106* 115*  BUN 26*  < > 25* 26* 21* 21* 21*  CREATININE 0.75  < > 0.72 0.81 0.64 0.69 0.69  CALCIUM 8.7*  < > 8.4* 8.8* 8.4* 8.4* 8.1*  MG 2.0  --  1.7 1.8  --  1.7 1.8  PHOS 4.3  --  3.3 4.5  --  3.8 4.4  < > = values in this interval not displayed.  Liver Function Tests:  Recent Labs Lab 02/23/16 0410 02/26/16 0518  AST 21 23  ALT 13* 13*  ALKPHOS 73 81  BILITOT 0.8 0.7  PROT 5.5* 5.0*  ALBUMIN 2.6* 2.1*   No results for input(s): LIPASE, AMYLASE in the last 168 hours. No results for input(s): AMMONIA in the last 168 hours. Coagulation Profile:  Recent Labs Lab 02/23/16 0410 02/24/16 0500 02/25/16 0500 02/26/16 0518 02/27/16 0520  INR 1.31 1.31 1.24 1.34  1.35   Cardiac Enzymes: No results for input(s): CKTOTAL, CKMB, CKMBINDEX, TROPONINI in the last 168 hours. BNP (last 3 results) No results for input(s): PROBNP in the last 8760 hours.  CBG:  Recent Labs Lab 02/26/16 2005 02/27/16 0015 02/27/16 0401 02/27/16 0747 02/27/16 1156  GLUCAP 127* 110* 114* 118* 115*    Studies: No results found.   Scheduled Meds: . antiseptic oral rinse  7 mL Mouth Rinse BID  . fluconazole (DIFLUCAN) IV  200 mg Intravenous Q24H  . insulin aspart  0-9 Units Subcutaneous Q4H  . levothyroxine  50 mcg Oral QAC breakfast  . lidocaine  1 patch Transdermal Q24H  . metoprolol  10 mg Intravenous Q6H  . nystatin  5 mL Oral QID  . pregabalin  75 mg Oral BID   Continuous Infusions: . sodium chloride 10 mL/hr at 02/20/16 2216  . Marland KitchenTPN (CLINIMIX-E) Adult 70 mL/hr at 02/27/16 1725   And  . fat emulsion 240 mL (02/27/16 1725)  . feeding supplement (VITAL 1.5 CAL)    . heparin 1,000 Units/hr (02/27/16 0809)   PRN Meds: acetaminophen **OR** acetaminophen, alum & mag hydroxide-simeth, diltiazem, LORazepam, methocarbamol (ROBAXIN)  IV, ondansetron **OR** ondansetron (ZOFRAN) IV, sodium chloride flush, traMADol  Time  spent: 30 minutes  Author: Berle Mull, MD [facility] Pager: [phone] 02/27/2016 6:38 PM  If 7PM-7AM, please contact night-coverage at [email or web address], password [facility]

## 2016-02-27 NOTE — Telephone Encounter (Signed)
Pt daughter called and stated that pt is in the hospital and she will call and reschedule remote once she gets home.

## 2016-02-27 NOTE — Progress Notes (Signed)
ANTICOAGULATION CONSULT NOTE - Follow Up Consult  Pharmacy Consult for heparin Indication: atrial fibrillation   Labs:  Recent Labs  02/25/16 0500  02/26/16 0518 02/26/16 1838 02/26/16 1955 02/27/16 0520  HGB  --   < > 8.4* 8.6*  --  8.0*  HCT  --   --  27.3* 27.0*  --  25.0*  PLT  --   --  181  --   --  196  LABPROT 15.7*  --  16.7*  --   --  16.8*  INR 1.24  --  1.34  --   --  1.35  HEPARINUNFRC  --   < > 0.17*  --  0.42 0.48  CREATININE 0.64  --  0.69  --   --  0.69  < > = values in this interval not displayed.  Assessment: 82 yo presenting with n/v  PMH: afib on warfarin, CKD, HTN, kidney/gastric CA  Anticoagulation- on coumadin PTA for h/o Afib.  Chadsvasc 5.   -PTA dose: coumadin 2mg  daily, last taken 6/12  Warfarin stopped 6/15, Lovenox per RX consult when INR <2 now to heparin gtt post-op  Heparin 1000 units/hr therapeutic x 2  Nephrology: h/o CKD: SCr 0.69  Heme: H&H 8/25, Plt 196  Goal of Therapy:  Heparin level 0.3-0.7 units/ml   Plan:  Heparin 1000 units/hr Follow for signs or symptoms of bleeding F/U Return to warfarin  Levester Fresh, PharmD, BCPS, [facility] Clinical Pharmacist Pager [phone] 02/27/2016 9:46 AM

## 2016-02-27 NOTE — Progress Notes (Signed)
[place] NOTE  Pharmacy Consult for TPN Indication: intolerance to enteral feeds  Allergies  Allergen Reactions  . Amiodarone Other (See Comments)     Thyroid and liver and kidney problems  . Codeine Nausea And Vomiting  . Gabapentin Itching  . Hydrocodone Other (See Comments)    hallucination  . Morphine And Related Nausea And Vomiting  . Requip [Ropinirole Hcl]     Headache   . Zinc Nausea Only    nausea  . Reglan [Metoclopramide] Hives, Itching and Rash    Made face red, also   Patient Measurements: Height: 5\' 1"  (154.9 cm) Weight: 143 lb 6.4 oz (65.046 kg) (pt preferred to be weighed in bed) IBW/kg (Calculated) : 47.8  Vital Signs: Temp: 99.4 F (37.4 C) (06/23 0700) Temp Source: Oral (06/23 0700) BP: 142/76 mmHg (06/23 0700) Pulse Rate: 109 (06/23 0700) Intake/Output from previous day: 06/22 0701 - 06/23 0700 In: 3213.7 [P.O.:220; I.V.:720.7; IV Piggyback:305; [id number] Out: 2975 [Urine:2400; Drains:575] Intake/Output from this shift:    Labs:  Recent Labs  02/25/16 0500 02/26/16 0518 02/26/16 1838 02/27/16 0520  WBC  --  9.8  --  7.1  HGB  --  8.4* 8.6* 8.0*  HCT  --  27.3* 27.0* 25.0*  PLT  --  181  --  196  INR 1.24 1.34  --  1.35    Recent Labs  02/25/16 0500 02/26/16 0518 02/27/16 0520  NA 134* 135 135  K 4.0 4.0 3.9  CL 104 107 108  CO2 23 22 22   GLUCOSE 120* 106* 115*  BUN 21* 21* 21*  CREATININE 0.64 0.69 0.69  CALCIUM 8.4* 8.4* 8.1*  MG  --  1.7 1.8  PHOS  --  3.8 4.4  PROT  --  5.0*  --   ALBUMIN  --  2.1*  --   AST  --  23  --   ALT  --  13*  --   ALKPHOS  --  81  --   BILITOT  --  0.7  --    Estimated Creatinine Clearance: 48.4 mL/min (by C-G formula based on Cr of 0.69).    Recent Labs  02/27/16 0015 02/27/16 0401 02/27/16 0747  GLUCAP 110* 114* 118*   Insulin Requirements in the past 24 hours:  1 unit on SSI sensitive q4h  Admit: 57 YOF with history of kidney and gastric cancer, s/p  proximal gastrectomy in early May. She was readmitted earlier in June with unretractable N/V and was on TPN at that time. This was discontinued prior to her going home- confirmed with Wynona Luna from [place].   Surgeries/Procedures: 6/20: Jtube placed  GI: GERD / anal fissure / gastric cancer. EGD during last admission showed esophagitis, gastric ulcer at gastric anastomosis, was to be on liquids only for 2 weeks at home. D/c erythromycin for gastroparesis. N/V with ice chips. Prealbumin 19.7>12.9. Diet: clears. Tolerating pills, still poor PO intake.  Jtube placement 6/20 - per Surgery, hold off on TF (started, then d/c'd w/ vomiting after 5h TF at 20ml/h) until passing flatus, ileus noted post-op.  -Pepcid, zofran PRN  Endo: hypothyroid on Synthroid back to PO. No hx DM. CBGs <150 on SSI  Lytes: K 3.9 (goal>/=4 w/ ileus), Mag 1.8 s/p 2 g (goal>/=2 w/ ileus), Phos 4.4, CorCa ~9.5  Renal: SCr 0.69 stable, CrCl ~40-37mL/min, good UOP  Pulm: RA  Cards: PAF / CAD / AS / tachy-brady syndrome post PPM / HTN / HLD /  CHF, Afib. IV Lopressor  Hepatobil: LFTs wnl, albumin 2.1, TG 181>76  Neuro: neuropathy / RLS / migraine / anxiety / hx CVA. - Pregabalin, robaxin prn  ID: Pressure ulcer identified - esophagitis - Fluconazole, Nystatin - Tm 100.3, WBC wnl  AC/heme: IV heparin for Afib, Hb down to 8, pltc wnl  Best Practices: IV heparin (holding warfarin), MC  TPN Access: PICC placed 6/14 TPN start date: 6/14>>  Current Nutrition:  -Clinimix E 5/15 at 25mL/hr + IV lipids at 44mL/hr - this provides 84g protein and 1672kcal which meets 100% of needs -Clear liquid diet  Nutritional Goals: (per RD recommendations 6/14) 1500-1700 kCal, 75-90 grams of protein per day  Plan:  -Continue Clinimix E 5/15 at 93mL/hr + IV lipids at 38mL/hr - provides 84g protein and 1672kcal which meets 100% of needs -MVI and trace elements in TPN -Pepcid 40mg  in TPN -Continue sensitive SSI and CBGs  q4h -Mag sulfate 2g IV x1 -KCl 10 meq IV q1h x2 -F/u labs in the AM -F/u TF start/toleration and ability to wean TPN  [facility], [place].D., BCPS Clinical Pharmacist Pager: [phone] 02/27/2016 8:21 AM

## 2016-02-27 NOTE — Progress Notes (Signed)
Physical Therapy Treatment Patient Details Name: [name] MRN: [id number] DOB: Dec 31, 1933 Today's Date: 02/27/2016    History of Present Illness [name] is a 82 y.o. female with medical history significant for but not limited to, atrial fibrillation, CKD, HTN, kidney cancer and gastric cancer. She is status post proximal gastrectomy early May for gastric adenocarcinoma. d/c home and readmitted with gastritis and received TNA. Went home and readmit 6/9 with N/V. 6/20 jejunostomy    PT Comments    Patient very motivated, however remains very weak and imbalanced.   Follow Up Recommendations  Home health PT;Supervision/Assistance - 24 hour     Equipment Recommendations  None recommended by PT    Recommendations for Other Services       Precautions / Restrictions Precautions Precautions: Fall Precaution Comments: J-tube Restrictions Weight Bearing Restrictions: No    Mobility  Bed Mobility                  Transfers Overall transfer level: Needs assistance Equipment used: Rolling walker (2 wheeled) Transfers: Sit to/from Stand Sit to Stand: Min assist         General transfer comment: min steady assist as slight posterior lean as comes fully upright; x 3  Ambulation/Gait             General Gait Details: unable due to 2 IV poles and j-tube to drain   Stairs            Wheelchair Mobility    Modified Rankin (Stroke Patients Only)       Balance Overall balance assessment: Needs assistance         Standing balance support: Single extremity supported Standing balance-Leahy Scale: Poor Standing balance comment: stood with RW; performed balance activities and exercises                    Cognition Arousal/Alertness: Awake/alert Behavior During Therapy: WFL for tasks assessed/performed Overall Cognitive Status: Within Functional Limits for tasks assessed                      Exercises General Exercises -  Lower Extremity Ankle Circles/Pumps: AROM;Both;10 reps Long Arc Quad: AROM;Both;5 reps (educated to slow down and dorsiflex) Hip Flexion/Marching: AAROM;Both;15 reps;Standing (assist for balance) Toe Raises: AAROM;Both;10 reps;Standing Heel Raises: AAROM;Both;10 reps;Standing Mini-Sqauts: AAROM;Both;10 reps;Standing    General Comments        Pertinent Vitals/Pain Pain Assessment: Faces Faces Pain Scale: Hurts a little bit Pain Location: abd Pain Descriptors / Indicators: Sore Pain Intervention(s): Limited activity within patient's tolerance    Home Living                      Prior Function            PT Goals (current goals can now be found in the care plan section) Acute Rehab PT Goals Patient Stated Goal: go home Time For Goal Achievement: 03/08/16 Progress towards PT goals: Progressing toward goals    Frequency  Min 3X/week    PT Plan Current plan remains appropriate    Co-evaluation             End of Session Equipment Utilized During Treatment: Gait belt Activity Tolerance: Patient limited by fatigue (3 seated rest breaks) Patient left: in chair;with call bell/phone within reach;with family/visitor present     Time: [id number] PT Time Calculation (min) (ACUTE ONLY): 23 min  Charges:  $Therapeutic Exercise: 8-22 mins $Therapeutic  Activity: 8-22 mins                    G Codes:      Jaideep Pollack 2016/03/18, 2:56 PM Pager [phone]

## 2016-02-28 LAB — BASIC METABOLIC PANEL
ANION GAP: 8 (ref 5–15)
BUN: 19 mg/dL (ref 6–20)
CALCIUM: 8.6 mg/dL — AB (ref 8.9–10.3)
CO2: 19 mmol/L — AB (ref 22–32)
Chloride: 108 mmol/L (ref 101–111)
Creatinine, Ser: 0.7 mg/dL (ref 0.44–1.00)
GFR calc non Af Amer: >60 mL/min (ref >60)
Glucose, Bld: 114 mg/dL — ABNORMAL HIGH (ref 65–99)
Potassium: 4 mmol/L (ref 3.5–5.1)
Sodium: 135 mmol/L (ref 135–145)

## 2016-02-28 LAB — GLUCOSE, CAPILLARY
GLUCOSE-CAPILLARY: 109 mg/dL — AB (ref 65–99)
GLUCOSE-CAPILLARY: 124 mg/dL — AB (ref 65–99)
GLUCOSE-CAPILLARY: 124 mg/dL — AB (ref 65–99)
GLUCOSE-CAPILLARY: 138 mg/dL — AB (ref 65–99)
GLUCOSE-CAPILLARY: 93 mg/dL (ref 65–99)
Glucose-Capillary: 121 mg/dL — ABNORMAL HIGH (ref 65–99)
Glucose-Capillary: 110 mg/dL — ABNORMAL HIGH (ref 65–99)

## 2016-02-28 LAB — CBC
HCT: 25.2 % — ABNORMAL LOW (ref 36.0–46.0)
HEMOGLOBIN: 8.1 g/dL — AB (ref 12.0–15.0)
MCH: 29.8 pg (ref 26.0–34.0)
MCHC: 32.1 g/dL (ref 30.0–36.0)
MCV: 92.6 fL (ref 78.0–100.0)
Platelets: 201 10*3/uL (ref 150–400)
RBC: 2.72 MIL/uL — ABNORMAL LOW (ref 3.87–5.11)
RDW: 16.8 % — AB (ref 11.5–15.5)
WBC: 7.2 10*3/uL (ref 4.0–10.5)

## 2016-02-28 LAB — MAGNESIUM: Magnesium: 1.9 mg/dL (ref 1.7–2.4)

## 2016-02-28 LAB — HEPARIN LEVEL (UNFRACTIONATED): Heparin Unfractionated: 0.54 IU/mL (ref 0.30–0.70)

## 2016-02-28 LAB — PROTIME-INR
INR: 1.38 (ref 0.00–1.49)
PROTHROMBIN TIME: 17.1 s — AB (ref 11.6–15.2)

## 2016-02-28 MED ORDER — TRACE MINERALS CR-CU-MN-SE-ZN 10-1000-500-60 MCG/ML IV SOLN
INTRAVENOUS | Status: AC
  Administered 2016-02-28: 18:00:00 via INTRAVENOUS
  Filled 2016-02-28: qty 1680

## 2016-02-28 MED ORDER — METOPROLOL TARTRATE 25 MG/10 ML ORAL SUSPENSION
50.0000 mg | Freq: Two times a day (BID) | ORAL | Status: DC
  Filled 2016-02-28: qty 20

## 2016-02-28 MED ORDER — POTASSIUM CHLORIDE 10 MEQ/50ML IV SOLN
10.0000 meq | INTRAVENOUS | Status: AC
  Administered 2016-02-28 (×2): 10 meq via INTRAVENOUS
  Filled 2016-02-28 (×2): qty 50

## 2016-02-28 MED ORDER — MAGNESIUM SULFATE 2 GM/50ML IV SOLN
2.0000 g | Freq: Once | INTRAVENOUS | Status: AC
Start: 2016-02-28 — End: 2016-02-28
  Administered 2016-02-28: 2 g via INTRAVENOUS
  Filled 2016-02-28: qty 50

## 2016-02-28 MED ORDER — METOPROLOL TARTRATE 25 MG/10 ML ORAL SUSPENSION
37.5000 mg | Freq: Two times a day (BID) | ORAL | Status: DC
  Filled 2016-02-28 (×2): qty 15

## 2016-02-28 MED ORDER — VITAL 1.5 CAL PO LIQD
1000.0000 mL | ORAL | Status: DC
  Administered 2016-02-28: 1000 mL
  Filled 2016-02-28 (×2): qty 1000

## 2016-02-28 MED ORDER — METOPROLOL TARTRATE 25 MG/10 ML ORAL SUSPENSION
50.0000 mg | Freq: Two times a day (BID) | ORAL | Status: DC
  Administered 2016-02-28: 50 mg via ORAL
  Filled 2016-02-28: qty 20

## 2016-02-28 MED ORDER — FAT EMULSION 20 % IV EMUL
240.0000 mL | INTRAVENOUS | Status: AC
  Administered 2016-02-28: 240 mL via INTRAVENOUS
  Filled 2016-02-28: qty 250

## 2016-02-28 MED ORDER — PROMETHAZINE HCL 25 MG/ML IJ SOLN
6.2500 mg | Freq: Once | INTRAMUSCULAR | Status: AC
Start: 2016-02-28 — End: 2016-02-28
  Administered 2016-02-28: 6.25 mg via INTRAVENOUS
  Filled 2016-02-28: qty 1

## 2016-02-28 NOTE — Progress Notes (Signed)
Pt receiving tube feeds at 10cc (rate as per family) since 1200. Vomited yellow liquid at 1800. Tube feeds stopped

## 2016-02-28 NOTE — Progress Notes (Signed)
[name] NOTE  Pharmacy Consult for TPN Indication: intolerance to enteral feeds  Allergies  Allergen Reactions  . Amiodarone Other (See Comments)     Thyroid and liver and kidney problems  . Codeine Nausea And Vomiting  . Gabapentin Itching  . Hydrocodone Other (See Comments)    hallucination  . Morphine And Related Nausea And Vomiting  . Requip [Ropinirole Hcl]     Headache   . Zinc Nausea Only    nausea  . Reglan [Metoclopramide] Hives, Itching and Rash    Made face red, also   Patient Measurements: Height: 5\' 1"  (154.9 cm) Weight: 140 lb 14.4 oz (63.912 kg) (bed; pt states too tired to stand) IBW/kg (Calculated) : 47.8  Vital Signs: Temp: 98.6 F (37 C) (06/24 0443) Temp Source: Oral (06/24 0443) BP: 141/54 mmHg (06/24 0443) Pulse Rate: 104 (06/24 0443) Intake/Output from previous day: 06/23 0701 - 06/24 0700 In: 1548.3 [P.O.:120; I.V.:120; IV Piggyback:427; TPN:881.3] Out: 1570 [Urine:1450; Drains:120] Intake/Output from this shift:    Labs:  Recent Labs  02/26/16 0518 02/26/16 1838 02/27/16 0520 02/28/16 0445  WBC 9.8  --  7.1 7.2  HGB 8.4* 8.6* 8.0* 8.1*  HCT 27.3* 27.0* 25.0* 25.2*  PLT 181  --  196 201  INR 1.34  --  1.35 1.38    Recent Labs  02/26/16 0518 02/27/16 0520 02/28/16 0445  NA 135 135 135  K 4.0 3.9 4.0  CL 107 108 108  CO2 22 22 19*  GLUCOSE 106* 115* 114*  BUN 21* 21* 19  CREATININE 0.69 0.69 0.70  CALCIUM 8.4* 8.1* 8.6*  MG 1.7 1.8 1.9  PHOS 3.8 4.4  --   PROT 5.0*  --   --   ALBUMIN 2.1*  --   --   AST 23  --   --   ALT 13*  --   --   ALKPHOS 81  --   --   BILITOT 0.7  --   --    Estimated Creatinine Clearance: 48 mL/min (by C-G formula based on Cr of 0.7).    Recent Labs  02/27/16 2003 02/28/16 0025 02/28/16 0418  GLUCAP 124* 124* 121*   Insulin Requirements in the past 24 hours:  3 units on SSI sensitive q4h  Admit: 62 YOF with history of kidney and gastric cancer, s/p proximal  gastrectomy in early May. She was readmitted earlier in June with unretractable N/V and was on TPN at that time. This was discontinued prior to her going home- confirmed with Wynona Luna from [place].   Surgeries/Procedures: 6/20: Jtube placed  GI: GERD / anal fissure / gastric cancer. EGD during last admission showed esophagitis, gastric ulcer at gastric anastomosis, was to be on liquids only for 2 weeks at home. D/c erythromycin for gastroparesis. Persistent nausea, no vomiting. Prealbumin 19.7>12.9. Diet: clears. Tolerating pills, still poor PO intake.  Jtube placement 6/20 - per Surgery, hold off on TF (started, then d/c'd w/ vomiting after 5h TF at 43ml/h) until passing flatus, ileus noted post-op.  -Pepcid, zofran PRN  Endo: hypothyroid on Synthroid back to PO. No hx DM. CBGs <150 on SSI  Lytes: K 4 s/p 20 meq (goal>/=4 w/ ileus), Mag 1.9 s/p 2 g (goal>/=2 w/ ileus), Phos 4.4, CoCa ~10  Renal: SCr 0.7 stable, CrCl ~40-67mL/min, good UOP  Pulm: RA  Cards: PAF / CAD / AS / tachy-brady syndrome post PPM / HTN / HLD / CHF, Afib. IV Lopressor  Hepatobil:  LFTs wnl, albumin 2.1, TG 181>76  Neuro: neuropathy / RLS / migraine / anxiety / hx CVA. - Pregabalin, lidocaine TD, robaxin prn  ID: Pressure ulcer identified - esophagitis - Fluconazole, Nystatin - Afeb, WBC wnl  AC/heme: IV heparin->warfarin for Afib, Hb down to 8.1, pltc wnl  Best Practices: IV heparin/warfarin, MC  TPN Access: PICC placed 6/14 TPN start date: 6/14>>  Current Nutrition:  -Clinimix E 5/15 at 47mL/hr + IV lipids at 17mL/hr - this provides 84g protein and 1672kcal which meets 100% of needs -Clear liquid diet  Nutritional Goals: (per RD recommendations 6/14) 1500-1700 kCal, 75-90 grams of protein per day  Plan:  -Continue Clinimix E 5/15 at 55mL/hr + IV lipids at 80mL/hr - provides 84g protein and 1672kcal which meets 100% of needs -MVI and trace elements in TPN -Pepcid 40mg  in TPN -Continue  sensitive SSI and CBGs q4h -KCl 10 meq IV q1h x2 -Mag sulfate 2g IV x1 -F/u labs in the AM -F/u TF start/toleration and ability to wean TPN  [facility], [place].D., BCPS Clinical Pharmacist Pager: [phone] 02/28/2016 7:23 AM

## 2016-02-28 NOTE — Progress Notes (Signed)
Refused bed alarm. Will continue to monitor. 

## 2016-02-28 NOTE — Progress Notes (Signed)
4 Days Post-Op  Subjective: Able to take Lyrica and Synthroid by mouth.  Passing gas and had a BM.  Objective: Vital signs in last 24 hours: Temp:  [97.4 F (36.3 C)-99.1 F (37.3 C)] 98.4 F (36.9 C) (06/24 0918) Pulse Rate:  [77-104] 77 (06/24 1011) Resp:  [17-22] 22 (06/24 0918) BP: (118-141)/(50-70) 127/70 mmHg (06/24 1011) SpO2:  [96 %-100 %] 99 % (06/24 0918) Weight:  [63.912 kg (140 lb 14.4 oz)] 63.912 kg (140 lb 14.4 oz) (06/24 0443) Last BM Date: 02/28/16 (pt had 3 BM stated by family)  Intake/Output from previous day: 06/23 0701 - 06/24 0700 In: 1548.3 [P.O.:120; I.V.:120; IV Piggyback:427; TPN:881.3] Out: 1570 [Urine:1450; Drains:120] Intake/Output this shift: Total I/O In: -  Out: 775 [Urine:775]  PE: General- In NAD Abdomen-soft, incision clean and intact, j-tube in left abdomen  Lab Results:   Recent Labs  02/27/16 0520 02/28/16 0445  WBC 7.1 7.2  HGB 8.0* 8.1*  HCT 25.0* 25.2*  PLT 196 201   BMET  Recent Labs  02/27/16 0520 02/28/16 0445  NA 135 135  K 3.9 4.0  CL 108 108  CO2 22 19*  GLUCOSE 115* 114*  BUN 21* 19  CREATININE 0.69 0.70  CALCIUM 8.1* 8.6*   PT/INR  Recent Labs  02/27/16 0520 02/28/16 0445  LABPROT 16.8* 17.1*  INR 1.35 1.38   Comprehensive Metabolic Panel:    Component Value Date/Time   NA 135 02/28/2016 0445   NA 135 02/27/2016 0520   NA 143 12/08/2015 1209   NA 143 11/24/2015 1232   K 4.0 02/28/2016 0445   K 3.9 02/27/2016 0520   K 4.2 12/08/2015 1209   K 4.7 11/24/2015 1232   CL 108 02/28/2016 0445   CL 108 02/27/2016 0520   CO2 19* 02/28/2016 0445   CO2 22 02/27/2016 0520   CO2 25 12/08/2015 1209   CO2 26 11/24/2015 1232   BUN 19 02/28/2016 0445   BUN 21* 02/27/2016 0520   BUN 25.3 12/08/2015 1209   BUN 20.9 11/24/2015 1232   CREATININE 0.70 02/28/2016 0445   CREATININE 0.69 02/27/2016 0520   CREATININE 1.1 12/08/2015 1209   CREATININE 1.3* 11/24/2015 1232   CREATININE 0.92 11/18/2015 1549   CREATININE 1.24* 10/09/2015 1156   GLUCOSE 114* 02/28/2016 0445   GLUCOSE 115* 02/27/2016 0520   GLUCOSE 89 12/08/2015 1209   GLUCOSE 85 11/24/2015 1232   CALCIUM 8.6* 02/28/2016 0445   CALCIUM 8.1* 02/27/2016 0520   CALCIUM 9.2 12/08/2015 1209   CALCIUM 9.4 11/24/2015 1232   AST 23 02/26/2016 0518   AST 21 02/23/2016 0410   AST 16 12/08/2015 1209   AST 15 11/24/2015 1232   ALT 13* 02/26/2016 0518   ALT 13* 02/23/2016 0410   ALT 10 12/08/2015 1209   ALT 10 11/24/2015 1232   ALKPHOS 81 02/26/2016 0518   ALKPHOS 73 02/23/2016 0410   ALKPHOS 104 12/08/2015 1209   ALKPHOS 100 11/24/2015 1232   BILITOT 0.7 02/26/2016 0518   BILITOT 0.8 02/23/2016 0410   BILITOT <0.30 12/08/2015 1209   BILITOT 0.44 11/24/2015 1232   PROT 5.0* 02/26/2016 0518   PROT 5.5* 02/23/2016 0410   PROT 7.0 12/08/2015 1209   PROT 7.4 11/24/2015 1232   ALBUMIN 2.1* 02/26/2016 0518   ALBUMIN 2.6* 02/23/2016 0410   ALBUMIN 3.1* 12/08/2015 1209   ALBUMIN 3.1* 11/24/2015 1232     Studies/Results: No results found.  Anti-infectives: Anti-infectives    Start  Dose/Rate Route Frequency Ordered Stop   02/24/16 1000  ceFAZolin (ANCEF) IVPB 2g/100 mL premix    Comments:  On call to OR.   2 g 200 mL/hr over 30 Minutes Intravenous To ShortStay Surgical 02/24/16 0848 02/24/16 1215   02/18/16 1000  fluconazole (DIFLUCAN) IVPB 200 mg     200 mg 100 mL/hr over 60 Minutes Intravenous Every 24 hours 02/18/16 0917     02/17/16 2200  erythromycin 250 mg in sodium chloride 0.9 % 100 mL IVPB  Status:  Discontinued     250 mg 100 mL/hr over 60 Minutes Intravenous Every 12 hours 02/17/16 1720 02/17/16 1853   02/17/16 2100  erythromycin 250 mg in sodium chloride 0.9 % 100 mL IVPB  Status:  Discontinued     250 mg 100 mL/hr over 60 Minutes Intravenous Every 12 hours 02/17/16 1853 02/20/16 0737      Assessment FTT following partial gastrectomy s/p jejunostomy placement 02/24/16 (Dr. Glade Lloyd gas and bowels  moving   LOS: 9 days   Plan: Start tube feeds.  Switch Lopressor to per tube.   Shareese Macha Lenna Sciara 02/28/2016

## 2016-02-28 NOTE — Progress Notes (Addendum)
ANTICOAGULATION CONSULT NOTE - Follow Up Consult  Pharmacy Consult for heparin Indication: atrial fibrillation   Labs:  Recent Labs  02/26/16 0518 02/26/16 1838 02/26/16 1955 02/27/16 0520 02/28/16 0445  HGB 8.4* 8.6*  --  8.0* 8.1*  HCT 27.3* 27.0*  --  25.0* 25.2*  PLT 181  --   --  196 201  LABPROT 16.7*  --   --  16.8* 17.1*  INR 1.34  --   --  1.35 1.38  HEPARINUNFRC 0.17*  --  0.42 0.48 0.54  CREATININE 0.69  --   --  0.69 0.70    Assessment: 82 yo presenting with n/v  Anticoagulation- on coumadin PTA for h/o Afib.  Chadsvasc 5.   -PTA dose: coumadin 2mg  daily, last taken 6/12 (INR on admit 3)  Warfarin stopped 6/15, Lovenox per RX consult when INR <2 now to heparin gtt post-op. INR today 1.38. AM HL therapeutic @ 0.54. Starting back coumadin today (6/24)  Heme: Heme: Hgb 8.1, PLT 201, no bleeding noted   Goal of Therapy:  Heparin level 0.3-0.7 units/ml   Plan:  Coumadin 5mg  x 1 tonight  Continue Heparin @ 1000 units/hr until INR >2 Daily HL/CBC/INR Follow for signs/symptoms of bleeding  Mando Blatz C. Lennox Grumbles, PharmD Pharmacy Resident  Pager: [phone] 02/28/2016 7:22 AM

## 2016-02-28 NOTE — Progress Notes (Addendum)
[facility] Progress Note  Patient: [name] [id number]   PCP: [name], MD DOB: 1933-09-15   DOA: 02/17/2016   DOS: 02/28/2016   Date of Service: the patient was seen and examined on 02/28/2016  Subjective: Did have 3 episodes of BM. Loose, brown color, No blood. Has no abdominal pain at the surgical site. No nausea, no episodes of vomiting. Nutrition: Currently minimal oral intake due to intolerance  Brief hospital course: Pt. with PMH of A. fib, CKD,, HTN, gastric cancer S/P proximal gastrectomy 01/2016; admitted on 02/17/2016, with complaint of vomiting, was found to have suspected esophagitis. Patient was given IV Diflucan, IV PPI and was started on TPN. Also started on IV erythromycin for motility. With initial treatment the patient was able to tolerate by mouth pills but no food intake and therefore it was decided to place a J-tube, procedure done on 02/24/2016. Since the procedure pt has developed ileus which Appears to be improving. She also has anemia which appears to be dilutional and refusing blood transfusion. Currently further plan is monitor for recovery from ileus  Assessment and Plan: 1. Gastroesophageal reflux disease with esophagitis from esophagitis. Gastric adenocarcinoma GERD with esophagitis Recent subtotal gastrectomy for gastric adenocarcinoma May 2017 Minimal oral intake by mouth despite improvement in nausea S/P G-tube placement 02/24/2016. Has persistent nausea as well as pain at the incision site but no vomiting. Continue IV Pepcid, continue TPN via PICC line. On clear liquid diet, G-tube to gravity only at present-24 hour output 120 mL, Diflucan for thrush, for a total 14 days, last day 03/02/2016 Surgery will decide on resuming tube feeding.  2. Paroxysmal A. fib. Essential hypertension, blood pressure well controlled Pacemaker implant Chadsvasc 5. On warfarin  Currently rate controlled. Continue Cardizem when necessary,  Change  IV lopressor to liquid 50 mg bid. Was given Lovenox preprocedure, postprocedure on heparin for anticoagulation. Since her hemoglobin appears to be remaining stable we will restart warfarin. No Evidence of active bleeding  3. Hypothyroidism. On oral levothyroxine at present. We will continue to monitor.  4. Dehydration. Mild renal insufficiency. Improvement with IV hydration. We will continue to monitor.  5. Postoperative blood loss anemia. Patient's hemoglobin dropped from 12-8.0. No evidence of active bleeding. Patient is 4 L positive between these 2 CBC check. This likely appears dilutional. Patient is refusing blood transfusion,  S/P IV Feraheme 5101, [place] Aranesp 1 as well as injection B-12 on 02/27/2016, after discussion with patient's hematology Dr Irene Limbo.  Pain management: When necessary Robaxin, when necessary tramadol, lidocaine patch Activity: Consulted physical therapy, home health 24-hour supervision recommended Bowel regimen: last BM 02/28/2016 Diet: Clear liquid diet, continue TPN DVT Prophylaxis: on therapeutic anticoagulation.  Advance goals of care discussion: DNR/DNI  Family Communication: family was present at bedside, at the time of interview. The pt provided permission to discuss medical plan with the family. Opportunity was given to ask question and all questions were answered satisfactorily.   Disposition:  Discharge to home with home health. Expected discharge date: 03/01/2016, pending improvement feeding tolerance  Consultants: Gen. surgery Procedures: G-tube placement, PICC line placement, TPN  Antibiotics: Anti-infectives    Start     Dose/Rate Route Frequency Ordered Stop   02/24/16 1000  ceFAZolin (ANCEF) IVPB 2g/100 mL premix    Comments:  On call to OR.   2 g 200 mL/hr over 30 Minutes Intravenous To ShortStay Surgical 02/24/16 0848 02/24/16 1215   02/18/16 1000  fluconazole (DIFLUCAN) IVPB 200 mg  200 mg 100 mL/hr over 60 Minutes  Intravenous Every 24 hours 02/18/16 0917     02/17/16 2200  erythromycin 250 mg in sodium chloride 0.9 % 100 mL IVPB  Status:  Discontinued     250 mg 100 mL/hr over 60 Minutes Intravenous Every 12 hours 02/17/16 1720 02/17/16 1853   02/17/16 2100  erythromycin 250 mg in sodium chloride 0.9 % 100 mL IVPB  Status:  Discontinued     250 mg 100 mL/hr over 60 Minutes Intravenous Every 12 hours 02/17/16 1853 02/20/16 0737        Intake/Output Summary (Last 24 hours) at 02/28/16 0807 Last data filed at 02/28/16 0442  Gross per 24 hour  Intake 1548.32 ml  Output   1570 ml  Net -21.68 ml   Filed Weights   02/25/16 0309 02/27/16 0406 02/28/16 0443  Weight: 63.095 kg (139 lb 1.6 oz) 65.046 kg (143 lb 6.4 oz) 63.912 kg (140 lb 14.4 oz)    Objective: Physical Exam: Filed Vitals:   02/27/16 1552 02/27/16 2004 02/28/16 0030 02/28/16 0443  BP: 141/64 118/67 138/50 141/54  Pulse: 100 86 100 104  Temp: 97.4 F (36.3 C) 99.1 F (37.3 C) 98.9 F (37.2 C) 98.6 F (37 C)  TempSrc: Oral Oral Oral Oral  Resp: 18 17 18 18   Height:      Weight:    63.912 kg (140 lb 14.4 oz)  SpO2: 100% 100% 96% 98%    General: Alert, Awake and Oriented to Time, Place and Person. Appear in mild distress Eyes: PERRL, Conjunctiva normal ENT: Oral Mucosa clear moist. Cardiovascular: S1 and S2 Present, no Murmur, Respiratory: Bilateral Air entry equal and Decreased, Clear to Auscultation, no Crackles, no wheezes Abdomen: Bowel Sound present, Soft and mild tenderness Skin: no redness, no Rash  Extremities: no Pedal edema, no calf tenderness Neurologic: Grossly no focal neuro deficit. Bilaterally Equal motor strength  Data Reviewed: CBC:  Recent Labs Lab 02/22/16 0359 02/23/16 0410 02/26/16 0518 02/26/16 1838 02/27/16 0520 02/28/16 0445  WBC 10.5 10.3 9.8  --  7.1 7.2  NEUTROABS  --  7.1  --   --   --   --   HGB 11.5* 10.5* 8.4* 8.6* 8.0* 8.1*  HCT 35.5* 33.1* 27.3* 27.0* 25.0* 25.2*  MCV 92.0 92.7  93.2  --  92.6 92.6  PLT 178 165 181  --  196 [id number]   Basic Metabolic Panel:  Recent Labs Lab 02/23/16 0410 02/24/16 0500 02/25/16 0500 02/26/16 0518 02/27/16 0520 02/28/16 0445  NA 133* 137 134* 135 135 135  K 3.7 3.4* 4.0 4.0 3.9 4.0  CL 104 103 104 107 108 108  CO2 20* 25 23 22 22  19*  GLUCOSE 120* 114* 120* 106* 115* 114*  BUN 25* 26* 21* 21* 21* 19  CREATININE 0.72 0.81 0.64 0.69 0.69 0.70  CALCIUM 8.4* 8.8* 8.4* 8.4* 8.1* 8.6*  MG 1.7 1.8  --  1.7 1.8 1.9  PHOS 3.3 4.5  --  3.8 4.4  --     Liver Function Tests:  Recent Labs Lab 02/23/16 0410 02/26/16 0518  AST 21 23  ALT 13* 13*  ALKPHOS 73 81  BILITOT 0.8 0.7  PROT 5.5* 5.0*  ALBUMIN 2.6* 2.1*   No results for input(s): LIPASE, AMYLASE in the last 168 hours. No results for input(s): AMMONIA in the last 168 hours. Coagulation Profile:  Recent Labs Lab 02/24/16 0500 02/25/16 0500 02/26/16 0518 02/27/16 0520 02/28/16 0445  INR 1.31 1.24  1.34 1.35 1.38   Cardiac Enzymes: No results for input(s): CKTOTAL, CKMB, CKMBINDEX, TROPONINI in the last 168 hours. BNP (last 3 results) No results for input(s): PROBNP in the last 8760 hours.  CBG:  Recent Labs Lab 02/27/16 1156 02/27/16 2003 02/28/16 0025 02/28/16 0418 02/28/16 0744  GLUCAP 115* 124* 124* 121* 109*    Studies: No results found.   Scheduled Meds: . antiseptic oral rinse  7 mL Mouth Rinse BID  . fluconazole (DIFLUCAN) IV  200 mg Intravenous Q24H  . insulin aspart  0-9 Units Subcutaneous Q4H  . levothyroxine  50 mcg Oral QAC breakfast  . lidocaine  1 patch Transdermal Q24H  . magnesium sulfate 1 - 4 g bolus IVPB  2 g Intravenous Once  . metoprolol  10 mg Intravenous Q6H  . nystatin  5 mL Oral QID  . potassium chloride  10 mEq Intravenous Q1 Hr x 2  . pregabalin  75 mg Oral BID   Continuous Infusions: . sodium chloride 10 mL/hr at 02/20/16 2216  . Marland KitchenTPN (CLINIMIX-E) Adult 70 mL/hr at 02/27/16 1725   And  . fat emulsion 240 mL  (02/27/16 1725)  . Marland KitchenTPN (CLINIMIX-E) Adult     And  . fat emulsion    . feeding supplement (VITAL 1.5 CAL)    . heparin 1,000 Units/hr (02/27/16 0809)   PRN Meds: acetaminophen **OR** acetaminophen, alum & mag hydroxide-simeth, diltiazem, LORazepam, methocarbamol (ROBAXIN)  IV, ondansetron **OR** ondansetron (ZOFRAN) IV, sodium chloride flush, traMADol  Time spent: 30 minutes  Author: Berle Mull, MD [facility] Pager: [phone] 02/28/2016 8:07 AM  If 7PM-7AM, please contact night-coverage at [email or web address], password [facility]

## 2016-02-29 ENCOUNTER — Inpatient Hospital Stay (HOSPITAL_COMMUNITY): Payer: Medicare Other

## 2016-02-29 LAB — CBC
HEMATOCRIT: 25.1 % — AB (ref 36.0–46.0)
Hemoglobin: 8 g/dL — ABNORMAL LOW (ref 12.0–15.0)
MCH: 29.7 pg (ref 26.0–34.0)
MCHC: 31.9 g/dL (ref 30.0–36.0)
MCV: 93.3 fL (ref 78.0–100.0)
Platelets: 233 10*3/uL (ref 150–400)
RBC: 2.69 MIL/uL — ABNORMAL LOW (ref 3.87–5.11)
RDW: 17 % — AB (ref 11.5–15.5)
WBC: 6.9 10*3/uL (ref 4.0–10.5)

## 2016-02-29 LAB — PROTIME-INR
INR: 1.24 (ref 0.00–1.49)
PROTHROMBIN TIME: 15.7 s — AB (ref 11.6–15.2)

## 2016-02-29 LAB — BASIC METABOLIC PANEL
Anion gap: 5 (ref 5–15)
BUN: 17 mg/dL (ref 6–20)
CALCIUM: 8.5 mg/dL — AB (ref 8.9–10.3)
CHLORIDE: 108 mmol/L (ref 101–111)
CO2: 22 mmol/L (ref 22–32)
CREATININE: 0.66 mg/dL (ref 0.44–1.00)
Glucose, Bld: 109 mg/dL — ABNORMAL HIGH (ref 65–99)
Potassium: 4 mmol/L (ref 3.5–5.1)
SODIUM: 135 mmol/L (ref 135–145)

## 2016-02-29 LAB — GLUCOSE, CAPILLARY
GLUCOSE-CAPILLARY: 130 mg/dL — AB (ref 65–99)
GLUCOSE-CAPILLARY: 117 mg/dL — AB (ref 65–99)
GLUCOSE-CAPILLARY: 119 mg/dL — AB (ref 65–99)
Glucose-Capillary: 129 mg/dL — ABNORMAL HIGH (ref 65–99)
Glucose-Capillary: 134 mg/dL — ABNORMAL HIGH (ref 65–99)

## 2016-02-29 LAB — MAGNESIUM: MAGNESIUM: 1.8 mg/dL (ref 1.7–2.4)

## 2016-02-29 LAB — HEPARIN LEVEL (UNFRACTIONATED): Heparin Unfractionated: 0.31 IU/mL (ref 0.30–0.70)

## 2016-02-29 MED ORDER — PROMETHAZINE HCL 25 MG/ML IJ SOLN
12.5000 mg | Freq: Four times a day (QID) | INTRAMUSCULAR | Status: DC | PRN
  Administered 2016-03-01: 12.5 mg via INTRAVENOUS
  Administered 2016-03-02: 6.25 mg via INTRAVENOUS
  Filled 2016-02-29 (×3): qty 1

## 2016-02-29 MED ORDER — WARFARIN SODIUM 5 MG PO TABS: 5.0000 mg | ORAL_TABLET | Freq: Once | ORAL | Status: DC

## 2016-02-29 MED ORDER — METOPROLOL TARTRATE 5 MG/5ML IV SOLN
5.0000 mg | Freq: Once | INTRAVENOUS | Status: AC
  Administered 2016-02-29: 5 mg via INTRAVENOUS
  Filled 2016-02-29: qty 5

## 2016-02-29 MED ORDER — GI COCKTAIL ~~LOC~~: 30.0000 mL | Freq: Once | ORAL | Status: DC

## 2016-02-29 MED ORDER — FAT EMULSION 20 % IV EMUL
240.0000 mL | INTRAVENOUS | Status: AC
  Administered 2016-02-29: 240 mL via INTRAVENOUS
  Filled 2016-02-29: qty 250

## 2016-02-29 MED ORDER — PANTOPRAZOLE SODIUM 40 MG PO PACK
40.0000 mg | PACK | Freq: Every day | ORAL | Status: DC
  Administered 2016-02-29 – 2016-03-01 (×2): 40 mg
  Filled 2016-02-29 (×2): qty 20

## 2016-02-29 MED ORDER — FLUCONAZOLE IN SODIUM CHLORIDE 200-0.9 MG/100ML-% IV SOLN
200.0000 mg | INTRAVENOUS | Status: AC
  Administered 2016-02-29 – 2016-03-02 (×3): 200 mg via INTRAVENOUS
  Filled 2016-02-29 (×3): qty 100

## 2016-02-29 MED ORDER — LEVOTHYROXINE SODIUM 100 MCG IV SOLR
25.0000 ug | Freq: Every day | INTRAVENOUS | Status: DC
  Administered 2016-02-29 – 2016-03-06 (×7): 25 ug via INTRAVENOUS
  Filled 2016-02-29 (×7): qty 5

## 2016-02-29 MED ORDER — CLINIMIX E/DEXTROSE (5/15) 5 % IV SOLN
INTRAVENOUS | Status: AC
  Administered 2016-02-29: 18:00:00 via INTRAVENOUS
  Filled 2016-02-29: qty 1680

## 2016-02-29 MED ORDER — FAMOTIDINE IN NACL 20-0.9 MG/50ML-% IV SOLN
20.0000 mg | Freq: Two times a day (BID) | INTRAVENOUS | Status: DC
  Administered 2016-02-29 (×2): 20 mg via INTRAVENOUS
  Filled 2016-02-29 (×4): qty 50

## 2016-02-29 MED ORDER — MAGNESIUM SULFATE 50 % IJ SOLN
3.0000 g | Freq: Once | INTRAVENOUS | Status: AC
  Administered 2016-02-29: 3 g via INTRAVENOUS
  Filled 2016-02-29: qty 6

## 2016-02-29 MED ORDER — WARFARIN - PHARMACIST DOSING INPATIENT: Freq: Every day | Status: DC

## 2016-02-29 MED ORDER — POTASSIUM CHLORIDE 10 MEQ/50ML IV SOLN
10.0000 meq | INTRAVENOUS | Status: AC
  Filled 2016-02-29 (×2): qty 50

## 2016-02-29 MED ORDER — METOPROLOL TARTRATE 5 MG/5ML IV SOLN
10.0000 mg | Freq: Four times a day (QID) | INTRAVENOUS | Status: DC
  Administered 2016-02-29 – 2016-03-06 (×23): 10 mg via INTRAVENOUS
  Filled 2016-02-29 (×25): qty 10

## 2016-02-29 NOTE — Progress Notes (Signed)
[facility] Progress Note  5 Days Post-Op  Subjective: Laying in bed, tired, struggling to stay awake. multiple family members in the room. Tube feeds started yesterday at 10 mL/hr. Pt did not tolerate feeds well - 6 hr later she vomited all night (12-6 am). Mild abdominal pain from vomiting. "doesnt know" if she is still passing gas but did have a BM at 5:30 AM that was normal.   Denies hematemesis, melena, hematochezia. Denies fever/chills/night sweats.   H&H stable BMP stable Objective: Vital signs in last 24 hours: Temp:  [99 F (37.2 C)] 99 F (37.2 C) (06/25 0455) Pulse Rate:  [115] 115 (06/25 0455) Resp:  [18] 18 (06/25 0455) BP: (155)/(82) 155/82 mmHg (06/25 0455) SpO2:  [98 %] 98 % (06/25 0455) Weight:  [64.456 kg (142 lb 1.6 oz)] 64.456 kg (142 lb 1.6 oz) (06/25 0455) Last BM Date: 02/28/16 (pt had 3 BM stated by family)  Intake/Output from previous day: 06/24 0701 - 06/25 0700 In: 465 [P.O.:300; IV Piggyback:165] Out: 3100 [Urine:3100] Intake/Output this shift:   PE: Gen:  lethargic, NAD, cooperative Card:  RRR, no M/G/R heard Pulm:  CTA, no W/R/R Abd: Soft, TTP lower abdomen esp RLQ, mildly distended, hypoactive BS, incisions/midline staples C/D/I Ext:  No erythema, edema, or tenderness, pedal pulses palpable BL  Lab Results:   Recent Labs  02/28/16 0445 02/29/16 0410  WBC 7.2 6.9  HGB 8.1* 8.0*  HCT 25.2* 25.1*  PLT 201 233   BMET  Recent Labs  02/28/16 0445 02/29/16 0410  NA 135 135  K 4.0 4.0  CL 108 108  CO2 19* 22  GLUCOSE 114* 109*  BUN 19 17  CREATININE 0.70 0.66  CALCIUM 8.6* 8.5*   PT/INR  Recent Labs  02/28/16 0445 02/29/16 0410  LABPROT 17.1* 15.7*  INR 1.38 1.24   CMP     Component Value Date/Time   NA 135 02/29/2016 0410   NA 143 12/08/2015 1209   K 4.0 02/29/2016 0410   K 4.2 12/08/2015 1209   CL 108 02/29/2016 0410   CO2 22 02/29/2016 0410   CO2 25 12/08/2015 1209   GLUCOSE 109* 02/29/2016 0410   GLUCOSE 89 12/08/2015 1209   BUN 17 02/29/2016 0410   BUN 25.3 12/08/2015 1209   CREATININE 0.66 02/29/2016 0410   CREATININE 1.1 12/08/2015 1209   CREATININE 0.92 11/18/2015 1549   CALCIUM 8.5* 02/29/2016 0410   CALCIUM 9.2 12/08/2015 1209   PROT 5.0* 02/26/2016 0518   PROT 7.0 12/08/2015 1209   ALBUMIN 2.1* 02/26/2016 0518   ALBUMIN 3.1* 12/08/2015 1209   AST 23 02/26/2016 0518   AST 16 12/08/2015 1209   ALT 13* 02/26/2016 0518   ALT 10 12/08/2015 1209   ALKPHOS 81 02/26/2016 0518   ALKPHOS 104 12/08/2015 1209   BILITOT 0.7 02/26/2016 0518   BILITOT <0.30 12/08/2015 1209   GFRNONAA >60 02/29/2016 0410   GFRNONAA 59* 11/18/2015 1549   GFRAA >60 02/29/2016 0410   GFRAA 68 11/18/2015 1549   Lipase     Component Value Date/Time   LIPASE 48 02/17/2016 1509       Studies/Results: Dg Abd Portable 1v  02/29/2016  CLINICAL DATA:  Nausea and vomiting. Jejunostomy tube placed 02/24/2016 EXAM: PORTABLE ABDOMEN - 1 VIEW COMPARISON:  02/17/2016 CT abdomen/ pelvis. FINDINGS: Midline abdominal skin staples. Bilateral deep pelvic surgical clips. Right upper quadrant cholecystectomy clips. Partially visualized right total hip arthroplasty. Jejunostomy tube terminates in the left abdomen. No dilated small  bowel loops. Mild gaseous distention of the colon. No evidence of pneumatosis or pneumoperitoneum. Moderate degenerative changes in the visualized thoracolumbar spine. Lung bases appear clear. Aortic atherosclerosis. IMPRESSION: 1. Jejunostomy tube terminates in the left abdomen. No evidence of small-bowel obstruction . 2. Mild gaseous distention of the colon, which may indicate a mild adynamic ileus . 3. No evidence of pneumatosis or pneumoperitoneum. 4. Aortic atherosclerosis. Electronically Signed   By: Ilona Sorrel M.D.   On: 02/29/2016 10:55    Anti-infectives: Anti-infectives    Start     Dose/Rate Route Frequency Ordered Stop   02/29/16 1000  fluconazole (DIFLUCAN) IVPB 200 mg      200 mg 100 mL/hr over 60 Minutes Intravenous Every 24 hours 02/29/16 0847 03/03/16 0959   02/24/16 1000  ceFAZolin (ANCEF) IVPB 2g/100 mL premix    Comments:  On call to OR.   2 g 200 mL/hr over 30 Minutes Intravenous To ShortStay Surgical 02/24/16 0848 02/24/16 1215   02/18/16 1000  fluconazole (DIFLUCAN) IVPB 200 mg  Status:  Discontinued     200 mg 100 mL/hr over 60 Minutes Intravenous Every 24 hours 02/18/16 0917 02/29/16 0847   02/17/16 2200  erythromycin 250 mg in sodium chloride 0.9 % 100 mL IVPB  Status:  Discontinued     250 mg 100 mL/hr over 60 Minutes Intravenous Every 12 hours 02/17/16 1720 02/17/16 1853   02/17/16 2100  erythromycin 250 mg in sodium chloride 0.9 % 100 mL IVPB  Status:  Discontinued     250 mg 100 mL/hr over 60 Minutes Intravenous Every 12 hours 02/17/16 1853 02/20/16 0737     Assessment/Plan S/p partial gastrectomy POD#4 open j-tube placement ([name])  ileus DG abd was ordered - J-tube in stable position, no evidence of SBO, mild colonic distention/ileus   Hold tube feeds  May take synthroid and Lyrica PO Resume all other medications per IV, not per tube  Continue TPN    LOS: 10 days    Jill Alexanders , [facility] Surgery 02/29/2016, 11:30 AM Pager: [phone] Mon-Fri 7:00 am-4:30 pm Sat-Sun 7:00 am-11:30 am

## 2016-02-29 NOTE — Progress Notes (Addendum)
ANTICOAGULATION CONSULT NOTE - Follow Up Consult  Pharmacy Consult for heparin Indication: atrial fibrillation   Labs:  Recent Labs  02/27/16 0520 02/28/16 0445 02/29/16 0410  HGB 8.0* 8.1* 8.0*  HCT 25.0* 25.2* 25.1*  PLT 196 201 233  LABPROT 16.8* 17.1* 15.7*  INR 1.35 1.38 1.24  HEPARINUNFRC 0.48 0.54 0.31  CREATININE 0.69 0.70 0.66    Assessment: 82 yo presenting with n/v  Anticoagulation- on coumadin PTA for h/o Afib.  Chadsvasc 5.   -PTA dose: coumadin 2mg  daily, last taken 6/12 (INR on admit 3)  Warfarin stopped 6/15, Lovenox per RX consult when INR <2 now to heparin gtt post-op. INR today 1.38. AM HL slightly therapeutic @ 0.31  Heme: Heme: Hgb 8.0, PLT 233, no bleeding noted   Goal of Therapy:  Heparin level 0.3-0.7 units/ml   Plan:  Increase Heparin to 1100 units/hr until INR >2 Daily HL/CBC Follow for signs/symptoms of bleeding  Sigismund Cross C. Lennox Grumbles, PharmD Pharmacy Resident  Pager: [phone] 02/29/2016 7:25 AM

## 2016-02-29 NOTE — Progress Notes (Signed)
[place] NOTE  Pharmacy Consult for TPN Indication: intolerance to enteral feeds  Allergies  Allergen Reactions  . Amiodarone Other (See Comments)     Thyroid and liver and kidney problems  . Codeine Nausea And Vomiting  . Gabapentin Itching  . Hydrocodone Other (See Comments)    hallucination  . Morphine And Related Nausea And Vomiting  . Requip [Ropinirole Hcl]     Headache   . Zinc Nausea Only    nausea  . Reglan [Metoclopramide] Hives, Itching and Rash    Made face red, also   Patient Measurements: Height: 5\' 1"  (154.9 cm) Weight: 142 lb 1.6 oz (64.456 kg) IBW/kg (Calculated) : 47.8  Vital Signs: Temp: 99 F (37.2 C) (06/25 0455) Temp Source: Oral (06/25 0455) BP: 155/82 mmHg (06/25 0455) Pulse Rate: 115 (06/25 0455) Intake/Output from previous day: 06/24 0701 - 06/25 0700 In: 410 [P.O.:300; IV Piggyback:110] Out: 3100 [Urine:3100] Intake/Output from this shift:    Labs:  Recent Labs  02/27/16 0520 02/28/16 0445 02/29/16 0410  WBC 7.1 7.2 6.9  HGB 8.0* 8.1* 8.0*  HCT 25.0* 25.2* 25.1*  PLT 196 201 233  INR 1.35 1.38 1.24    Recent Labs  02/27/16 0520 02/28/16 0445 02/29/16 0410  NA 135 135 135  K 3.9 4.0 4.0  CL 108 108 108  CO2 22 19* 22  GLUCOSE 115* 114* 109*  BUN 21* 19 17  CREATININE 0.69 0.70 0.66  CALCIUM 8.1* 8.6* 8.5*  MG 1.8 1.9 1.8  PHOS 4.4  --   --    Estimated Creatinine Clearance: 48.3 mL/min (by C-G formula based on Cr of 0.66).    Recent Labs  02/28/16 2030 02/28/16 2311 02/29/16 0455  GLUCAP 93 124* 129*   Insulin Requirements in the past 24 hours:  3 units on SSI sensitive q4h  Admit: 47 YOF with history of kidney and gastric cancer, s/p proximal gastrectomy in early May. She was readmitted earlier in June with unretractable N/V and was on TPN at that time. This was discontinued prior to her going home- confirmed with Wynona Luna from [place].   Surgeries/Procedures: 6/20: Jtube  placed  GI: GERD / anal fissure / gastric cancer. EGD during last admission showed esophagitis, gastric ulcer at gastric anastomosis, was to be on liquids only for 2 weeks at home. D/c erythromycin for gastroparesis. Jtube placement 6/20 - per Surgery. Prealbumin 19.7>12.9. Diet: clears. Tolerating pills, passing flatus, had BM 6/24. Resumed TF at 20 ml/hr 6/24 but stopped after 6h d/t vomiting (same happened 6/21) -Pepcid, zofran PRN  Endo: hypothyroid on Synthroid back to PO. No hx DM. CBGs <150 on SSI  Lytes: K 4 s/p 20 meq and appears to need daily KCl (goal>/=4 w/ ileus), Mag 1.8 s/p 2 g so will increase dose to get closer to goal (goal>/=2 w/ ileus), Phos 4.4, CoCa ~10;   Renal: SCr 0.7 stable, CrCl ~40-65mL/min, good UOP  Pulm: RA  Cards: PAF / CAD / AS / tachy-brady syndrome post PPM / HTN / HLD / CHF, Afib. Lopressor  Hepatobil: LFTs wnl, albumin 2.1, TG 181>76  Neuro: neuropathy / RLS / migraine / anxiety / hx CVA. - Pregabalin, lidocaine TD, robaxin prn  ID: Pressure ulcer identified - esophagitis - Fluconazole, Nystatin - Afeb, WBC wnl  AC/heme: IV heparin->warfarin for Afib, Hb down to 8, pltc wnl  Best Practices: IV heparin/warfarin, MC  TPN Access: PICC placed 6/14 TPN start date: 6/14>>  Current Nutrition:  -  Clinimix E 5/15 at 24mL/hr + IV lipids at 46mL/hr - this provides 84g protein and 1672kcal which meets 100% of needs -Clear liquid diet  Nutritional Goals: (per RD recommendations 6/14) 1500-1700 kCal, 75-90 grams of protein per day  Plan:  -Continue Clinimix E 5/15 at 55mL/hr + IV lipids at 31mL/hr - provides 84g protein and 1672kcal which meets 100% of needs -MVI and trace elements in TPN -Pepcid 40mg  in TPN -Continue sensitive SSI and CBGs q4h -KCl 10 meq IV q1h x2 -Mag sulfate 3g IV x1 -F/u labs in the AM -F/u TF start/toleration and ability to wean TPN  [facility], [place].D., BCPS Clinical Pharmacist Pager: [phone] 02/29/2016 7:53 AM

## 2016-02-29 NOTE — Progress Notes (Signed)
[facility] Progress Note  Patient: [name] [id number]   PCP: [name], MD DOB: December 20, 1933   DOA: 02/17/2016   DOS: 02/29/2016   Date of Service: the patient was seen and examined on 02/29/2016  Subjective: Started having episodes of vomiting again after initiation of the tube feeding only at 20 mL per hour. No abdominal pain. Vomiting and nausea resolved after stopping the tube feeding. Nutrition: Currently minimal oral intake due to intolerance  Brief hospital course: Pt. with PMH of A. fib, CKD,HTN, gastric cancer S/P proximal gastrectomy 01/2016; admitted on 02/17/2016, with complaint of vomiting, was found to have suspected esophagitis. Patient was given IV Diflucan, IV PPI and was started on TPN. Also started on IV erythromycin for motility. With initial treatment the patient was able to tolerate by mouth pills but no food intake and therefore it was decided to place a J-tube, procedure done on 02/24/2016. Since the procedure pt has developed ileus which Appears to be improving. She also has anemia which appears to be dilutional and refusing blood transfusion. Currently further plan is monitor for recovery from ileus  Assessment and Plan: 1. Gastroesophageal reflux disease with esophagitis from esophagitis. Gastric adenocarcinoma GERD with esophagitis Recent subtotal gastrectomy for gastric adenocarcinoma May 2017 Minimal oral intake by mouth despite improvement in nausea S/P G-tube placement 02/24/2016. Reoccurrence of nausea and vomiting after initiation of the tube feeding. Currently back to nothing by mouth Continue IV Pepcid, continue TPN via PICC line. On clear liquid diet, G-tube to gravity only at present-24 hour output 120 mL, Diflucan for thrush, for a total 14 days, last day 03/02/2016 Back to nothing by mouth. We will continue to monitor. X-ray abdomen shows evidence of adynamic ileus. We will monitor for recommendation from general surgery  regarding motility promoting agents.  2. Paroxysmal A. fib. Essential hypertension, blood pressure well controlled Pacemaker implant Chadsvasc 5. On warfarin  Currently rate controlled. Continue Cardizem when necessary,  Change IV lopressor to liquid 50 mg bid. Was given Lovenox preprocedure, postprocedure on heparin for anticoagulation. The patient remains nothing by mouth we will hold off on warfarin No Evidence of active bleeding  3. Hypothyroidism. Aging to IV Synthroid at present We will continue to monitor.  4. Dehydration. Mild renal insufficiency. Improvement with IV hydration. We will continue to monitor.  5. Postoperative blood loss anemia. Patient's hemoglobin dropped from 12-8.0. No evidence of active bleeding. Patient is 4 L positive between these 2 CBC check. This likely appears dilutional. Patient is refusing blood transfusion,  S/P IV Feraheme 5101, [place] Aranesp 1 as well as injection B-12 on 02/27/2016, after discussion with patient's hematology Dr Irene Limbo.  Pain management: When necessary Robaxin, when necessary tramadol, lidocaine patch Activity: Consulted physical therapy, home health 24-hour supervision recommended Bowel regimen: last BM 02/28/2016 Diet: Nothing by mouth, continue TPN DVT Prophylaxis: on therapeutic anticoagulation.  Advance goals of care discussion: DNR/DNI  Family Communication: family was present at bedside, at the time of interview. The pt provided permission to discuss medical plan with the family. Opportunity was given to ask question and all questions were answered satisfactorily.   Disposition:  Discharge to home with home health. Expected discharge date: 03/01/2016, pending improvement feeding tolerance  Consultants: Gen. surgery Procedures: G-tube placement, PICC line placement, TPN  Antibiotics: Anti-infectives    Start     Dose/Rate Route Frequency Ordered Stop   02/29/16 1000  fluconazole (DIFLUCAN) IVPB 200 mg     200  mg 100 mL/hr over 60  Minutes Intravenous Every 24 hours 02/29/16 0847 03/03/16 0959   02/24/16 1000  ceFAZolin (ANCEF) IVPB 2g/100 mL premix    Comments:  On call to OR.   2 g 200 mL/hr over 30 Minutes Intravenous To ShortStay Surgical 02/24/16 0848 02/24/16 1215   02/18/16 1000  fluconazole (DIFLUCAN) IVPB 200 mg  Status:  Discontinued     200 mg 100 mL/hr over 60 Minutes Intravenous Every 24 hours 02/18/16 0917 02/29/16 0847   02/17/16 2200  erythromycin 250 mg in sodium chloride 0.9 % 100 mL IVPB  Status:  Discontinued     250 mg 100 mL/hr over 60 Minutes Intravenous Every 12 hours 02/17/16 1720 02/17/16 1853   02/17/16 2100  erythromycin 250 mg in sodium chloride 0.9 % 100 mL IVPB  Status:  Discontinued     250 mg 100 mL/hr over 60 Minutes Intravenous Every 12 hours 02/17/16 1853 02/20/16 0737        Intake/Output Summary (Last 24 hours) at 02/29/16 1632 Last data filed at 02/29/16 1600  Gross per 24 hour  Intake    295 ml  Output   3225 ml  Net  -2930 ml   Filed Weights   02/27/16 0406 02/28/16 0443 02/29/16 0455  Weight: 65.046 kg (143 lb 6.4 oz) 63.912 kg (140 lb 14.4 oz) 64.456 kg (142 lb 1.6 oz)    Objective: Physical Exam: Filed Vitals:   02/28/16 1011 02/28/16 1130 02/29/16 0455 02/29/16 1151  BP: 127/70 128/77 155/82 124/70  Pulse: 77 68 115 86  Temp:  98.2 F (36.8 C) 99 F (37.2 C) 98.2 F (36.8 C)  TempSrc:  Oral Oral Oral  Resp:  18 18 18   Height:      Weight:   64.456 kg (142 lb 1.6 oz)   SpO2:  98% 98% 98%    General: Alert, Awake and Oriented to Time, Place and Person. Appear in mild distress Eyes: PERRL, Conjunctiva normal ENT: Oral Mucosa clear moist. Cardiovascular: S1 and S2 Present, no Murmur, Respiratory: Bilateral Air entry equal and Decreased, Clear to Auscultation, no Crackles, no wheezes Abdomen: Bowel Sound Absent, Soft and mild tenderness Skin: no redness, no Rash  Extremities: no Pedal edema, no calf tenderness Neurologic:  Grossly no focal neuro deficit. Bilaterally Equal motor strength  Data Reviewed: CBC:  Recent Labs Lab 02/23/16 0410 02/26/16 0518 02/26/16 1838 02/27/16 0520 02/28/16 0445 02/29/16 0410  WBC 10.3 9.8  --  7.1 7.2 6.9  NEUTROABS 7.1  --   --   --   --   --   HGB 10.5* 8.4* 8.6* 8.0* 8.1* 8.0*  HCT 33.1* 27.3* 27.0* 25.0* 25.2* 25.1*  MCV 92.7 93.2  --  92.6 92.6 93.3  PLT 165 181  --  196 201 0000000   Basic Metabolic Panel:  Recent Labs Lab 02/23/16 0410 02/24/16 0500 02/25/16 0500 02/26/16 0518 02/27/16 0520 02/28/16 0445 02/29/16 0410  NA 133* 137 134* 135 135 135 135  K 3.7 3.4* 4.0 4.0 3.9 4.0 4.0  CL 104 103 104 107 108 108 108  CO2 20* 25 23 22 22  19* 22  GLUCOSE 120* 114* 120* 106* 115* 114* 109*  BUN 25* 26* 21* 21* 21* 19 17  CREATININE 0.72 0.81 0.64 0.69 0.69 0.70 0.66  CALCIUM 8.4* 8.8* 8.4* 8.4* 8.1* 8.6* 8.5*  MG 1.7 1.8  --  1.7 1.8 1.9 1.8  PHOS 3.3 4.5  --  3.8 4.4  --   --  Liver Function Tests:  Recent Labs Lab 02/23/16 0410 02/26/16 0518  AST 21 23  ALT 13* 13*  ALKPHOS 73 81  BILITOT 0.8 0.7  PROT 5.5* 5.0*  ALBUMIN 2.6* 2.1*   No results for input(s): LIPASE, AMYLASE in the last 168 hours. No results for input(s): AMMONIA in the last 168 hours. Coagulation Profile:  Recent Labs Lab 02/25/16 0500 02/26/16 0518 02/27/16 0520 02/28/16 0445 02/29/16 0410  INR 1.24 1.34 1.35 1.38 1.24   Cardiac Enzymes: No results for input(s): CKTOTAL, CKMB, CKMBINDEX, TROPONINI in the last 168 hours. BNP (last 3 results) No results for input(s): PROBNP in the last 8760 hours.  CBG:  Recent Labs Lab 02/28/16 2030 02/28/16 2311 02/29/16 0455 02/29/16 0758 02/29/16 1148  GLUCAP 93 124* 129* 130* 134*    Studies: Dg Abd Portable 1v  02/29/2016  CLINICAL DATA:  Nausea and vomiting. Jejunostomy tube placed 02/24/2016 EXAM: PORTABLE ABDOMEN - 1 VIEW COMPARISON:  02/17/2016 CT abdomen/ pelvis. FINDINGS: Midline abdominal skin staples.  Bilateral deep pelvic surgical clips. Right upper quadrant cholecystectomy clips. Partially visualized right total hip arthroplasty. Jejunostomy tube terminates in the left abdomen. No dilated small bowel loops. Mild gaseous distention of the colon. No evidence of pneumatosis or pneumoperitoneum. Moderate degenerative changes in the visualized thoracolumbar spine. Lung bases appear clear. Aortic atherosclerosis. IMPRESSION: 1. Jejunostomy tube terminates in the left abdomen. No evidence of small-bowel obstruction . 2. Mild gaseous distention of the colon, which may indicate a mild adynamic ileus . 3. No evidence of pneumatosis or pneumoperitoneum. 4. Aortic atherosclerosis. Electronically Signed   By: Ilona Sorrel M.D.   On: 02/29/2016 10:55     Scheduled Meds: . antiseptic oral rinse  7 mL Mouth Rinse BID  . famotidine (PEPCID) IV  20 mg Intravenous Q12H  . fluconazole (DIFLUCAN) IV  200 mg Intravenous Q24H  . insulin aspart  0-9 Units Subcutaneous Q4H  . levothyroxine  25 mcg Intravenous Daily  . lidocaine  1 patch Transdermal Q24H  . metoprolol  10 mg Intravenous Q6H  . pantoprazole sodium  40 mg Per Tube Daily  . pregabalin  75 mg Oral BID   Continuous Infusions: . sodium chloride 10 mL/hr at 02/20/16 2216  . Marland KitchenTPN (CLINIMIX-E) Adult 70 mL/hr at 02/28/16 1743   And  . fat emulsion 240 mL (02/28/16 1743)  . Marland KitchenTPN (CLINIMIX-E) Adult     And  . fat emulsion    . heparin 1,100 Units/hr (02/29/16 0758)   PRN Meds: acetaminophen **OR** acetaminophen, alum & mag hydroxide-simeth, diltiazem, LORazepam, methocarbamol (ROBAXIN)  IV, ondansetron **OR** ondansetron (ZOFRAN) IV, promethazine, sodium chloride flush  Time spent: 30 minutes  Author: Berle Mull, MD [facility] Pager: [phone] 02/29/2016 4:32 PM  If 7PM-7AM, please contact night-coverage at [email or web address], password [facility]

## 2016-03-01 ENCOUNTER — Inpatient Hospital Stay (HOSPITAL_COMMUNITY): Payer: Medicare Other

## 2016-03-01 LAB — COMPREHENSIVE METABOLIC PANEL
ALK PHOS: 117 U/L (ref 38–126)
ALT: 44 U/L (ref 14–54)
AST: 44 U/L — AB (ref 15–41)
Albumin: 2.1 g/dL — ABNORMAL LOW (ref 3.5–5.0)
Anion gap: 6 (ref 5–15)
BILIRUBIN TOTAL: 0.6 mg/dL (ref 0.3–1.2)
BUN: 16 mg/dL (ref 6–20)
CHLORIDE: 107 mmol/L (ref 101–111)
CO2: 23 mmol/L (ref 22–32)
CREATININE: 0.73 mg/dL (ref 0.44–1.00)
Calcium: 8.5 mg/dL — ABNORMAL LOW (ref 8.9–10.3)
GFR calc Af Amer: >60 mL/min (ref >60)
Glucose, Bld: 116 mg/dL — ABNORMAL HIGH (ref 65–99)
Potassium: 4 mmol/L (ref 3.5–5.1)
Sodium: 136 mmol/L (ref 135–145)
TOTAL PROTEIN: 5.2 g/dL — AB (ref 6.5–8.1)

## 2016-03-01 LAB — DIFFERENTIAL
Basophils Absolute: 0 10*3/uL (ref 0.0–0.1)
Basophils Relative: 0 %
EOS ABS: 0.5 10*3/uL (ref 0.0–0.7)
EOS PCT: 8 %
LYMPHS ABS: 1.3 10*3/uL (ref 0.7–4.0)
Lymphocytes Relative: 20 %
Monocytes Absolute: 0.8 10*3/uL (ref 0.1–1.0)
Monocytes Relative: 12 %
NEUTROS PCT: 60 %
Neutro Abs: 4 10*3/uL (ref 1.7–7.7)

## 2016-03-01 LAB — PROTIME-INR
INR: 1.3 (ref 0.00–1.49)
Prothrombin Time: 16.4 seconds — ABNORMAL HIGH (ref 11.6–15.2)

## 2016-03-01 LAB — GLUCOSE, CAPILLARY
GLUCOSE-CAPILLARY: 125 mg/dL — AB (ref 65–99)
GLUCOSE-CAPILLARY: 131 mg/dL — AB (ref 65–99)
GLUCOSE-CAPILLARY: 145 mg/dL — AB (ref 65–99)
Glucose-Capillary: 121 mg/dL — ABNORMAL HIGH (ref 65–99)
Glucose-Capillary: 119 mg/dL — ABNORMAL HIGH (ref 65–99)
Glucose-Capillary: 115 mg/dL — ABNORMAL HIGH (ref 65–99)

## 2016-03-01 LAB — CBC
HCT: 25.6 % — ABNORMAL LOW (ref 36.0–46.0)
HEMOGLOBIN: 8 g/dL — AB (ref 12.0–15.0)
MCH: 29.6 pg (ref 26.0–34.0)
MCHC: 31.3 g/dL (ref 30.0–36.0)
MCV: 94.8 fL (ref 78.0–100.0)
Platelets: 230 10*3/uL (ref 150–400)
RBC: 2.7 MIL/uL — ABNORMAL LOW (ref 3.87–5.11)
RDW: 17.1 % — ABNORMAL HIGH (ref 11.5–15.5)
WBC: 6.6 10*3/uL (ref 4.0–10.5)

## 2016-03-01 LAB — MAGNESIUM: MAGNESIUM: 1.7 mg/dL (ref 1.7–2.4)

## 2016-03-01 LAB — TRIGLYCERIDES: Triglycerides: 93 mg/dL (ref <150)

## 2016-03-01 LAB — PREALBUMIN: Prealbumin: 13 mg/dL — ABNORMAL LOW (ref 18–38)

## 2016-03-01 LAB — HEPARIN LEVEL (UNFRACTIONATED): HEPARIN UNFRACTIONATED: 0.56 [IU]/mL (ref 0.30–0.70)

## 2016-03-01 LAB — PHOSPHORUS: Phosphorus: 4.5 mg/dL (ref 2.5–4.6)

## 2016-03-01 MED ORDER — MAGNESIUM SULFATE 4 GM/100ML IV SOLN
4.0000 g | Freq: Once | INTRAVENOUS | Status: AC
  Administered 2016-03-01: 4 g via INTRAVENOUS
  Filled 2016-03-01: qty 100

## 2016-03-01 MED ORDER — FAT EMULSION 20 % IV EMUL
240.0000 mL | INTRAVENOUS | Status: AC
  Administered 2016-03-01: 240 mL via INTRAVENOUS
  Filled 2016-03-01: qty 250

## 2016-03-01 MED ORDER — DIATRIZOATE MEGLUMINE & SODIUM 66-10 % PO SOLN
ORAL | Status: AC
  Filled 2016-03-01: qty 30

## 2016-03-01 MED ORDER — IOPAMIDOL (ISOVUE-300) INJECTION 61%
INTRAVENOUS | Status: AC
  Administered 2016-03-01: 100 mL
  Filled 2016-03-01: qty 100

## 2016-03-01 MED ORDER — TRACE MINERALS CR-CU-MN-SE-ZN 10-1000-500-60 MCG/ML IV SOLN
INTRAVENOUS | Status: AC
  Administered 2016-03-01: 18:00:00 via INTRAVENOUS
  Filled 2016-03-01: qty 1680

## 2016-03-01 NOTE — Progress Notes (Signed)
PARENTERAL NUTRITION CONSULT NOTE - FOLLOW UP  Pharmacy Consult for TPN Indication: intolerance to enteral feeds  Allergies  Allergen Reactions  . Amiodarone Other (See Comments)     Thyroid and liver and kidney problems  . Codeine Nausea And Vomiting  . Gabapentin Itching  . Hydrocodone Other (See Comments)    hallucination  . Morphine And Related Nausea And Vomiting  . Requip [Ropinirole Hcl]     Headache   . Zinc Nausea Only    nausea  . Reglan [Metoclopramide] Hives, Itching and Rash    Made face red, also    Patient Measurements: Height: 5\' 1"  (154.9 cm) Weight: 142 lb 6.4 oz (64.592 kg) (bed) IBW/kg (Calculated) : 47.8   Vital Signs: Temp: 99.5 F (37.5 C) (06/26 0423) Temp Source: Oral (06/26 0423) BP: 126/53 mmHg (06/26 0423) Pulse Rate: 107 (06/26 0423) Intake/Output from previous day: 06/25 0701 - 06/26 0700 In: -  Out: 2676 [Urine:2675; Stool:1] Intake/Output from this shift:    Labs:  Recent Labs  02/28/16 0445 02/29/16 0410 03/01/16 0500  WBC 7.2 6.9 6.6  HGB 8.1* 8.0* 8.0*  HCT 25.2* 25.1* 25.6*  PLT 201 233 230  INR 1.38 1.24 1.30     Recent Labs  02/28/16 0445 02/29/16 0410 03/01/16 0500  NA 135 135 136  K 4.0 4.0 4.0  CL 108 108 107  CO2 19* 22 23  GLUCOSE 114* 109* 116*  BUN 19 17 16   CREATININE 0.70 0.66 0.73  CALCIUM 8.6* 8.5* 8.5*  MG 1.9 1.8 1.7  PHOS  --   --  4.5  PROT  --   --  5.2*  ALBUMIN  --   --  2.1*  AST  --   --  44*  ALT  --   --  44  ALKPHOS  --   --  117  BILITOT  --   --  0.6  PREALBUMIN  --   --  13.0*  TRIG  --   --  93   Estimated Creatinine Clearance: 48.3 mL/min (by C-G formula based on Cr of 0.73).    Recent Labs  02/29/16 1937 03/01/16 0030 03/01/16 0423  GLUCAP 119* 131* 121*    Medications:  Scheduled:  . antiseptic oral rinse  7 mL Mouth Rinse BID  . famotidine (PEPCID) IV  20 mg Intravenous Q12H  . fluconazole (DIFLUCAN) IV  200 mg Intravenous Q24H  . insulin aspart  0-9  Units Subcutaneous Q4H  . levothyroxine  25 mcg Intravenous Daily  . lidocaine  1 patch Transdermal Q24H  . metoprolol  10 mg Intravenous Q6H  . pantoprazole sodium  40 mg Per Tube Daily  . pregabalin  75 mg Oral BID    Insulin Requirements in the past 24 hours:  4 units on SSI sensitive q4h  Admit: 43 YOF with history of kidney and gastric cancer, s/p proximal gastrectomy in early May. She was readmitted earlier in June with unretractable N/V and was on TPN at that time. This was discontinued prior to her going home- confirmed with Wynona Luna from [place].   Surgeries/Procedures: 6/20: Jtube placed  GI: GERD / anal fissure / gastric cancer. EGD during last admission showed esophagitis, gastric ulcer at gastric anastomosis, was to be on liquids only for 2 weeks at home. S/P Emycin for gastroparesis (6/13 >> 6/16).  JT placed & have attempted resuming TF x 2, but stopped each time after 6hr d/t N/V (6/21 & 6/24).  Prealbumin 19.7>12.9>13.  Changed to NPO 6/25. Mostly IV meds, passing flatus, had BM 6/24.  -Pepcid IV (ordered by MD 6/25) &40mg  in TPN, PPI per tube (rec'd 6/25), zofran PRN  Endo: hypothyroid on Synthroid- back to IV 6/25. No hx DM. CBGs 117-130 on SSI  Lytes: K 4- K runs not given 6/25 (goal>/=4 w/ ileus), Mag 1.7-s/p 7g over last 3 days (goal>/=2 w/ ileus), Phos 4.5, CoCa ~10;   Renal: SCr 0.7 stable, CrCl ~40-28mL/min, good UOP  Pulm: RA  Cards: PAF / CAD / AS / tachy-brady syndrome post PPM / HTN / HLD / CHF, Afib. Lopressor IV  Hepatobil: LFTs wnl, albumin 2.1, TG 181>76  Neuro: neuropathy / RLS / migraine / anxiety / hx CVA. - Pregabalin, lidocaine TD, robaxin prn  ID: Pressure ulcer identified - esophagitis - Fluconazole- now IV thru 6/27.  Afeb, WBC wnl  AC/heme: IV heparin->warfarin for Afib, Hb down to 8, pltc wnl  Best Practices: IV heparin/warfarin, MC  TPN Access: PICC placed 6/14 TPN start date: 6/14>>  Current Nutrition:  Clinimix E  5/15 at 23mL/hr + IV lipids at 29mL/hr - this provides 84g protein and 1672kcal which meets 100% of needs NPO  Nutritional Goals: (per RD recommendations 6/14) 1500-1700 kCal, 75-90 grams of protein per day  Plan:  Continue Clinimix E 5/15 at 89mL/hr + IV lipids at 69mL/hr MVI and trace elements in TPN Pepcid 40mg  in TPN D/C Pepcid boluses Continue sensitive SSI and CBGs q4h Mg 4g IV x 1 BMet, Mg in AM   Gracy Bruins, PharmD Clinical Pharmacist [facility]

## 2016-03-01 NOTE — Progress Notes (Signed)
6 Days Post-Op  Subjective: Complains of nausea and vomiting and loose stools  Objective: Vital signs in last 24 hours: Temp:  [98.2 F (36.8 C)-99.5 F (37.5 C)] 99.5 F (37.5 C) (06/26 0423) Pulse Rate:  [83-107] 107 (06/26 0423) Resp:  [18-19] 19 (06/26 0423) BP: (124-150)/(53-70) 126/53 mmHg (06/26 0423) SpO2:  [97 %-99 %] 97 % (06/26 0423) Weight:  [64.592 kg (142 lb 6.4 oz)] 64.592 kg (142 lb 6.4 oz) (06/26 0423) Last BM Date: 02/28/16 (pt had 3 BM stated by family)  Intake/Output from previous day: 06/25 0701 - 06/26 0700 In: 0  Out: 3276 [Urine:3275; Stool:1] Intake/Output this shift:    Resp: clear to auscultation bilaterally Cardio: regular rate and rhythm GI: soft, moderate tenderness at incision. few bs. incision looks good  Lab Results:   Recent Labs  02/29/16 0410 03/01/16 0500  WBC 6.9 6.6  HGB 8.0* 8.0*  HCT 25.1* 25.6*  PLT 233 230   BMET  Recent Labs  02/29/16 0410 03/01/16 0500  NA 135 136  K 4.0 4.0  CL 108 107  CO2 22 23  GLUCOSE 109* 116*  BUN 17 16  CREATININE 0.66 0.73  CALCIUM 8.5* 8.5*   PT/INR  Recent Labs  02/29/16 0410 03/01/16 0500  LABPROT 15.7* 16.4*  INR 1.24 1.30   ABG No results for input(s): PHART, HCO3 in the last 72 hours.  Invalid input(s): PCO2, PO2  Studies/Results: Dg Abd Portable 1v  02/29/2016  CLINICAL DATA:  Nausea and vomiting. Jejunostomy tube placed 02/24/2016 EXAM: PORTABLE ABDOMEN - 1 VIEW COMPARISON:  02/17/2016 CT abdomen/ pelvis. FINDINGS: Midline abdominal skin staples. Bilateral deep pelvic surgical clips. Right upper quadrant cholecystectomy clips. Partially visualized right total hip arthroplasty. Jejunostomy tube terminates in the left abdomen. No dilated small bowel loops. Mild gaseous distention of the colon. No evidence of pneumatosis or pneumoperitoneum. Moderate degenerative changes in the visualized thoracolumbar spine. Lung bases appear clear. Aortic atherosclerosis. IMPRESSION: 1.  Jejunostomy tube terminates in the left abdomen. No evidence of small-bowel obstruction . 2. Mild gaseous distention of the colon, which may indicate a mild adynamic ileus . 3. No evidence of pneumatosis or pneumoperitoneum. 4. Aortic atherosclerosis. Electronically Signed   By: Ilona Sorrel M.D.   On: 02/29/2016 10:55    Anti-infectives: Anti-infectives    Start     Dose/Rate Route Frequency Ordered Stop   02/29/16 1000  fluconazole (DIFLUCAN) IVPB 200 mg     200 mg 100 mL/hr over 60 Minutes Intravenous Every 24 hours 02/29/16 0847 03/03/16 0959   02/24/16 1000  ceFAZolin (ANCEF) IVPB 2g/100 mL premix    Comments:  On call to OR.   2 g 200 mL/hr over 30 Minutes Intravenous To ShortStay Surgical 02/24/16 0848 02/24/16 1215   02/18/16 1000  fluconazole (DIFLUCAN) IVPB 200 mg  Status:  Discontinued     200 mg 100 mL/hr over 60 Minutes Intravenous Every 24 hours 02/18/16 0917 02/29/16 0847   02/17/16 2200  erythromycin 250 mg in sodium chloride 0.9 % 100 mL IVPB  Status:  Discontinued     250 mg 100 mL/hr over 60 Minutes Intravenous Every 12 hours 02/17/16 1720 02/17/16 1853   02/17/16 2100  erythromycin 250 mg in sodium chloride 0.9 % 100 mL IVPB  Status:  Discontinued     250 mg 100 mL/hr over 60 Minutes Intravenous Every 12 hours 02/17/16 1853 02/20/16 0737      Assessment/Plan: s/p Procedure(s): OPEN J TUBE (Left) Advance diet. Restart tube  feeds at 10cc/hr and hold there for now Not clear why she continues to vomit. There is no sign of obstruction. Will discuss with [name]  LOS: 11 days    TOTH III,Zaraya Delauder S 03/01/2016

## 2016-03-01 NOTE — Progress Notes (Signed)
Nutrition Follow-up  DOCUMENTATION CODES:   Non-severe (moderate) malnutrition in context of chronic illness  INTERVENTION:  TPN management per pharmacy Recommend re-initiating Vital 1.5 @ 5 ml/hr via J-tube and increase by 5 ml/hr every 2-4 hours as tolerated to goal rate of 45 ml/hr.  Recommend weaning TPN once pt is able to tolerate TF at 20 ml/hr   NUTRITION DIAGNOSIS:   Malnutrition related to chronic illness, altered GI function as evidenced by mild depletion of body fat, mild depletion of muscle mass, percent weight loss.  Ongoing  GOAL:   Patient will meet greater than or equal to 90% of their needs  Being met  MONITOR:   Diet advancement, PO intake, Skin, I & O's, Labs, Weight trends  REASON FOR ASSESSMENT:   Consult Enteral/tube feeding initiation and management  ASSESSMENT:   82 y.o. female with a Past Medical History of PAF CAD, tachy/brady syndrome, RLS, GERD, uterine cancer and renal cancer, HTN, HLD, hypothyroidism, gastroparesis, Adenocarcinoma of the stomach s/p partial gastrectomy, ckd, and cva, who presents with nausea vomiting abdominal pain. This seems to be a recurrent issue for patient ever since she underwent partial gastrectomy.   Pt feeling nauseous today and TF on hold. She reports that nausea and indigestion worsen with tube feeding, but she denies any pain with feeds. She continues to receive TPN at goal which is meeting 100% of energy and protein needs. Her weight is up 9 lbs from one week ago. She denies any swelling or edema.   Labs: low calcium, low hemoglobin  Diet Order:  TPN (CLINIMIX-E) Adult Diet NPO time specified TPN (CLINIMIX-E) Adult  Skin:  Reviewed, no issues (closed abdominal incision)  Last BM:  6/24  Height:   Ht Readings from Last 1 Encounters:  02/17/16 '5\' 1"'$  (1.549 m)    Weight:   Wt Readings from Last 1 Encounters:  03/01/16 142 lb 6.4 oz (64.592 kg)    Ideal Body Weight:  47.7 kg  BMI:  Body mass index  is 26.92 kg/(m^2).  Estimated Nutritional Needs:   Kcal:  1500-1700  Protein:  75-90 grams  Fluid:  1.5-1.7 L/day  EDUCATION NEEDS:   No education needs identified at this time  [place], LDN Inpatient Clinical Dietitian Pager: [phone] After Hours Pager: [phone]

## 2016-03-01 NOTE — Progress Notes (Signed)
Patient and daughters refused bed alarm. Will continue to monitor patient.

## 2016-03-01 NOTE — Progress Notes (Signed)
[facility] Progress Note  Patient: [name] [id number]   PCP: [name], MD DOB: 1934/03/06   DOA: 02/17/2016   DOS: 03/01/2016   Date of Service: the patient was seen and examined on 03/01/2016  Subjective: Continues to have recurrence of nausea and vomiting. Nutrition: Currently minimal oral intake due to intolerance  Brief hospital course: Pt. with PMH of A. fib, CKD,HTN, gastric cancer S/P proximal gastrectomy 01/2016; admitted on 02/17/2016, with complaint of vomiting, was found to have suspected esophagitis. Patient was given IV Diflucan, IV PPI and was started on TPN. Also started on IV erythromycin for motility. With initial treatment the patient was able to tolerate by mouth pills but no food intake and therefore it was decided to place a J-tube, procedure done on 02/24/2016. Since the procedure pt has developed ileus which Appears to be improving. She also has anemia which appears to be dilutional and refusing blood transfusion. Currently further plan is monitor for recovery from ileus  Assessment and Plan: 1. Gastroesophageal reflux disease with esophagitis from esophagitis. Gastric adenocarcinoma GERD with esophagitis Recent subtotal gastrectomy for gastric adenocarcinoma May 2017 Minimal oral intake by mouth despite improvement in nausea S/P G-tube placement 02/24/2016. Reoccurrence of nausea and vomiting after initiation of the tube feeding. Currently back to nothing by mouth Continue IV Pepcid, continue TPN via PICC line. On clear liquid diet, G-tube to gravity only at present-24 hour output 120 mL, Diflucan for thrush, for a total 14 days, last day 03/02/2016 Back to nothing by mouth. We will continue to monitor. X-ray abdomen shows evidence of adynamic ileus.  Surgery pounding a CT scan of the abdomen. We will monitor.  2. Paroxysmal A. fib. Essential hypertension, blood pressure well controlled Pacemaker implant Chadsvasc 5. On warfarin    Currently rate controlled. Continue Cardizem when necessary,  Change IV lopressor Was given Lovenox preprocedure, postprocedure on heparin for anticoagulation. The patient remains nothing by mouth we will hold off on warfarin No Evidence of active bleeding  3. Hypothyroidism. Aging to IV Synthroid at present We will continue to monitor.  4. Dehydration. Mild renal insufficiency. Improvement with IV hydration. We will continue to monitor.  5. Postoperative blood loss anemia. Patient's hemoglobin dropped from 12-8.0. No evidence of active bleeding. Patient is 4 L positive between these 2 CBC check. This likely appears dilutional. Patient is refusing blood transfusion,  S/P IV Feraheme 5101, [place] Aranesp 1 as well as injection B-12 on 02/27/2016, after discussion with patient's hematology Dr Irene Limbo.  Pain management: When necessary Robaxin, when necessary tramadol, lidocaine patch Activity: Consulted physical therapy, home health 24-hour supervision recommended Bowel regimen: last BM 02/28/2016 Diet: Nothing by mouth, continue TPN DVT Prophylaxis: on therapeutic anticoagulation.  Advance goals of care discussion: DNR/DNI  Family Communication: family was present at bedside, at the time of interview. The pt provided permission to discuss medical plan with the family. Opportunity was given to ask question and all questions were answered satisfactorily.   Disposition:  Discharge to home with home health. Expected discharge date: 03/05/2016, pending improvement feeding tolerance  Consultants: Gen. surgery Procedures: G-tube placement, PICC line placement, TPN  Antibiotics: Anti-infectives    Start     Dose/Rate Route Frequency Ordered Stop   02/29/16 1000  fluconazole (DIFLUCAN) IVPB 200 mg     200 mg 100 mL/hr over 60 Minutes Intravenous Every 24 hours 02/29/16 0847 03/03/16 0959   02/24/16 1000  ceFAZolin (ANCEF) IVPB 2g/100 mL premix    Comments:  On call  to OR.   2 g 200  mL/hr over 30 Minutes Intravenous To ShortStay Surgical 02/24/16 0848 02/24/16 1215   02/18/16 1000  fluconazole (DIFLUCAN) IVPB 200 mg  Status:  Discontinued     200 mg 100 mL/hr over 60 Minutes Intravenous Every 24 hours 02/18/16 0917 02/29/16 0847   02/17/16 2200  erythromycin 250 mg in sodium chloride 0.9 % 100 mL IVPB  Status:  Discontinued     250 mg 100 mL/hr over 60 Minutes Intravenous Every 12 hours 02/17/16 1720 02/17/16 1853   02/17/16 2100  erythromycin 250 mg in sodium chloride 0.9 % 100 mL IVPB  Status:  Discontinued     250 mg 100 mL/hr over 60 Minutes Intravenous Every 12 hours 02/17/16 1853 02/20/16 0737        Intake/Output Summary (Last 24 hours) at 03/01/16 1952 Last data filed at 03/01/16 1837  Gross per 24 hour  Intake 8882.8 ml  Output   2377 ml  Net 6505.8 ml   Filed Weights   02/28/16 0443 02/29/16 0455 03/01/16 0423  Weight: 63.912 kg (140 lb 14.4 oz) 64.456 kg (142 lb 1.6 oz) 64.592 kg (142 lb 6.4 oz)    Objective: Physical Exam: Filed Vitals:   03/01/16 0035 03/01/16 0423 03/01/16 0917 03/01/16 1529  BP: 150/58 126/53 147/60 113/64  Pulse: 94 107 95 98  Temp: 98.7 F (37.1 C) 99.5 F (37.5 C) 98.3 F (36.8 C) 98.1 F (36.7 C)  TempSrc: Oral Oral Oral Oral  Resp: 18 19 18 18   Height:      Weight:  64.592 kg (142 lb 6.4 oz)    SpO2: 97% 97% 98% 97%    General: Alert, Awake and Oriented to Time, Place and Person. Appear in mild distress Eyes: PERRL, Conjunctiva normal ENT: Oral Mucosa clear moist. Cardiovascular: S1 and S2 Present, no Murmur, Respiratory: Bilateral Air entry equal and Decreased, Clear to Auscultation, no Crackles, no wheezes Abdomen: Bowel Sound Absent, Soft and mild tenderness Skin: no redness, no Rash  Extremities: no Pedal edema, no calf tenderness Neurologic: Grossly no focal neuro deficit. Bilaterally Equal motor strength  Data Reviewed: CBC:  Recent Labs Lab 02/26/16 0518 02/26/16 1838 02/27/16 0520  02/28/16 0445 02/29/16 0410 03/01/16 0500  WBC 9.8  --  7.1 7.2 6.9 6.6  NEUTROABS  --   --   --   --   --  4.0  HGB 8.4* 8.6* 8.0* 8.1* 8.0* 8.0*  HCT 27.3* 27.0* 25.0* 25.2* 25.1* 25.6*  MCV 93.2  --  92.6 92.6 93.3 94.8  PLT 181  --  196 201 233 [id number]   Basic Metabolic Panel:  Recent Labs Lab 02/24/16 0500  02/26/16 0518 02/27/16 0520 02/28/16 0445 02/29/16 0410 03/01/16 0500  NA 137  < > 135 135 135 135 136  K 3.4*  < > 4.0 3.9 4.0 4.0 4.0  CL 103  < > 107 108 108 108 107  CO2 25  < > 22 22 19* 22 23  GLUCOSE 114*  < > 106* 115* 114* 109* 116*  BUN 26*  < > 21* 21* 19 17 16   CREATININE 0.81  < > 0.69 0.69 0.70 0.66 0.73  CALCIUM 8.8*  < > 8.4* 8.1* 8.6* 8.5* 8.5*  MG 1.8  --  1.7 1.8 1.9 1.8 1.7  PHOS 4.5  --  3.8 4.4  --   --  4.5  < > = values in this interval not displayed.  Liver Function Tests:  Recent Labs Lab 02/26/16 0518 03/01/16 0500  AST 23 44*  ALT 13* 44  ALKPHOS 81 117  BILITOT 0.7 0.6  PROT 5.0* 5.2*  ALBUMIN 2.1* 2.1*   No results for input(s): LIPASE, AMYLASE in the last 168 hours. No results for input(s): AMMONIA in the last 168 hours. Coagulation Profile:  Recent Labs Lab 02/26/16 0518 02/27/16 0520 02/28/16 0445 02/29/16 0410 03/01/16 0500  INR 1.34 1.35 1.38 1.24 1.30   Cardiac Enzymes: No results for input(s): CKTOTAL, CKMB, CKMBINDEX, TROPONINI in the last 168 hours. BNP (last 3 results) No results for input(s): PROBNP in the last 8760 hours.  CBG:  Recent Labs Lab 03/01/16 0030 03/01/16 0423 03/01/16 0915 03/01/16 1159 03/01/16 1642  GLUCAP 131* 121* 145* 119* 115*    Studies: Ct Abdomen Pelvis W Contrast  03/01/2016  CLINICAL DATA:  Postop from subtotal gastrectomy and J-tube placement for gastric carcinoma. Persistent nausea and vomiting. Previous left nephrectomy for renal cell carcinoma. EXAM: CT ABDOMEN AND PELVIS WITH CONTRAST TECHNIQUE: Multidetector CT imaging of the abdomen and pelvis was performed using  the standard protocol following bolus administration of intravenous contrast. CONTRAST:  110mL ISOVUE-300 IOPAMIDOL (ISOVUE-300) INJECTION 61% COMPARISON:  02/17/2016 FINDINGS: Lower chest: Tiny right pleural effusion and mild bibasilar atelectasis are increased since prior study. Hepatobiliary: No masses or other significant abnormality. Prior cholecystectomy noted. No evidence of biliary dilatation. Pancreas: No mass, inflammatory changes, or other significant abnormality. Spleen: Within normal limits in size and appearance. Adrenals/Urinary Tract: Adrenal glands are normal in appearance. Right kidney is also normal in appearance. Previous left nephrectomy noted. Unopacified urinary bladder is unremarkable in appearance. Stomach/Bowel: Postop changes from subtotal gastrectomy appears stable. A percutaneous jejunostomy tube is now seen in place. No evidence of bowel obstruction, inflammatory process, or abnormal fluid collections. Vascular/Lymphatic: No pathologically enlarged lymph nodes. No evidence of abdominal aortic aneurysm. Aortic atherosclerosis noted. Reproductive: Prior hysterectomy noted. Adnexal regions are unremarkable in appearance. Other: New hematoma seen within the anterior abdominal wall subcutaneous tissues at site of surgical incision which measures 3 x 6 cm. Several tiny foci of intraperitoneal air noted just deep to the anterior abdominal wall subcutaneous tissues, consistent with minimal residual postop intraperitoneal air. Musculoskeletal: Beam hardening artifact through the inferior pelvis from right hip prosthesis. No suspicious bone lesions identified. IMPRESSION: New hematoma seen anterior abdominal wall subcutaneous tissues at surgical incision site measuring 3 x 6 cm. No evidence of intraperitoneal hematoma or abscess Increased tiny left pleural effusion and bibasilar atelectasis. Aortic atherosclerosis noted. Electronically Signed   By: Earle Gell M.D.   On: 03/01/2016 15:32      Scheduled Meds: . antiseptic oral rinse  7 mL Mouth Rinse BID  . diatrizoate meglumine-sodium      . fluconazole (DIFLUCAN) IV  200 mg Intravenous Q24H  . insulin aspart  0-9 Units Subcutaneous Q4H  . levothyroxine  25 mcg Intravenous Daily  . lidocaine  1 patch Transdermal Q24H  . metoprolol  10 mg Intravenous Q6H  . pantoprazole sodium  40 mg Per Tube Daily  . pregabalin  75 mg Oral BID   Continuous Infusions: . sodium chloride 10 mL/hr at 02/20/16 2216  . Marland KitchenTPN (CLINIMIX-E) Adult 70 mL/hr at 03/01/16 1755   And  . fat emulsion 240 mL (03/01/16 1755)  . heparin 1,100 Units/hr (03/01/16 0900)   PRN Meds: acetaminophen **OR** acetaminophen, alum & mag hydroxide-simeth, diltiazem, LORazepam, methocarbamol (ROBAXIN)  IV, ondansetron **OR** ondansetron (ZOFRAN) IV, promethazine, sodium chloride flush  Time  spent: 30 minutes  Author: Berle Mull, MD [facility] Pager: [phone] 03/01/2016 7:52 PM  If 7PM-7AM, please contact night-coverage at [email or web address], password [facility]

## 2016-03-01 NOTE — Progress Notes (Signed)
PT Cancellation Note  Patient Details Name: [name] MRN: [id number] DOB: March 02, 1934   Cancelled Treatment:    Reason Eval/Treat Not Completed: Medical issues which prohibited therapy. Patient sleeping. Daughter reports continues with N/V and has had robaxin and phenergan making her sleep. Will re-attempt in pm as schedule permits.   Caine Barfield 03/01/2016, 10:47 AM  Pager [phone]

## 2016-03-01 NOTE — Care Management Important Message (Signed)
Important Message  Patient Details  Name: [name] MRN: [id number] Date of Birth: 07-10-1934   Medicare Important Message Given:  Yes    Loann Quill 03/01/2016, 8:45 AM

## 2016-03-01 NOTE — Progress Notes (Signed)
ANTICOAGULATION CONSULT NOTE - Follow Up Consult  Pharmacy Consult for heparin Indication: atrial fibrillation   Labs:  Recent Labs  02/28/16 0445 02/29/16 0410 03/01/16 0500  HGB 8.1* 8.0* 8.0*  HCT 25.2* 25.1* 25.6*  PLT 201 233 230  LABPROT 17.1* 15.7* 16.4*  INR 1.38 1.24 1.30  HEPARINUNFRC 0.54 0.31 0.56  CREATININE 0.70 0.66 0.73    Assessment: 82 yo presenting with n/v. Pharmacy consulted to dose/monitor heparin gtt. Pt now 6 days post op 6/20: Jtube placed. Continues to have N/V on TPN.  Anticoagulation- on coumadin PTA for h/o Afib.  Chadsvasc 5.   -PTA dose: coumadin 2mg  daily, last taken 6/12 (INR on admit 3) Warfarin stopped 6/15. Hgb 8.0, PLT 230, no bleeding noted  HL therapeutic @ 0.56.   Goal of Therapy:  Heparin level 0.3-0.7 units/ml   Plan:  Continue Heparin at 1100 units/hr Check anti-Xa level daily while on heparin Continue to monitor H&H and platelets F/u plan to restart OAC/warfarin   Thank you for allowing [place] to participate in this patients care. Jens Som, PharmD Pager: [phone]  03/01/2016 11:34 AM

## 2016-03-01 NOTE — Progress Notes (Signed)
Patient ID: [name], female   DOB: [birth year], 82 y.o.   MRN: [id number] 6 Days Post-Op  Subjective: Pretty miserable.  Vomiting again.  Having abdominal pain.    Objective: Vital signs in last 24 hours: Temp:  [98.2 F (36.8 C)-99.5 F (37.5 C)] 98.3 F (36.8 C) (06/26 0917) Pulse Rate:  [83-107] 95 (06/26 0917) Resp:  [18-19] 18 (06/26 0917) BP: (124-150)/(53-70) 147/60 mmHg (06/26 0917) SpO2:  [97 %-99 %] 98 % (06/26 0917) Weight:  [64.592 kg (142 lb 6.4 oz)] 64.592 kg (142 lb 6.4 oz) (06/26 0423) Last BM Date: 02/28/16 (pt had 3 BM stated by family)  Intake/Output from previous day: 06/25 0701 - 06/26 0700 In: 0  Out: 3276 [Urine:3275; Stool:1] Intake/Output this shift: Total I/O In: 0  Out: 401 [Urine:400; Stool:1]  Resp: breathing comfortably Cardio: regular rate and rhythm GI: soft, hematoma at incision.  No erythema.  Lab Results:   Recent Labs  02/29/16 0410 03/01/16 0500  WBC 6.9 6.6  HGB 8.0* 8.0*  HCT 25.1* 25.6*  PLT 233 230   BMET  Recent Labs  02/29/16 0410 03/01/16 0500  NA 135 136  K 4.0 4.0  CL 108 107  CO2 22 23  GLUCOSE 109* 116*  BUN 17 16  CREATININE 0.66 0.73  CALCIUM 8.5* 8.5*   PT/INR  Recent Labs  02/29/16 0410 03/01/16 0500  LABPROT 15.7* 16.4*  INR 1.24 1.30   ABG No results for input(s): PHART, HCO3 in the last 72 hours.  Invalid input(s): PCO2, PO2  Studies/Results: Dg Abd Portable 1v  02/29/2016  CLINICAL DATA:  Nausea and vomiting. Jejunostomy tube placed 02/24/2016 EXAM: PORTABLE ABDOMEN - 1 VIEW COMPARISON:  02/17/2016 CT abdomen/ pelvis. FINDINGS: Midline abdominal skin staples. Bilateral deep pelvic surgical clips. Right upper quadrant cholecystectomy clips. Partially visualized right total hip arthroplasty. Jejunostomy tube terminates in the left abdomen. No dilated small bowel loops. Mild gaseous distention of the colon. No evidence of pneumatosis or pneumoperitoneum. Moderate degenerative changes in  the visualized thoracolumbar spine. Lung bases appear clear. Aortic atherosclerosis. IMPRESSION: 1. Jejunostomy tube terminates in the left abdomen. No evidence of small-bowel obstruction . 2. Mild gaseous distention of the colon, which may indicate a mild adynamic ileus . 3. No evidence of pneumatosis or pneumoperitoneum. 4. Aortic atherosclerosis. Electronically Signed   By: Ilona Sorrel M.D.   On: 02/29/2016 10:55    Anti-infectives: Anti-infectives    Start     Dose/Rate Route Frequency Ordered Stop   02/29/16 1000  fluconazole (DIFLUCAN) IVPB 200 mg     200 mg 100 mL/hr over 60 Minutes Intravenous Every 24 hours 02/29/16 0847 03/03/16 0959   02/24/16 1000  ceFAZolin (ANCEF) IVPB 2g/100 mL premix    Comments:  On call to OR.   2 g 200 mL/hr over 30 Minutes Intravenous To ShortStay Surgical 02/24/16 0848 02/24/16 1215   02/18/16 1000  fluconazole (DIFLUCAN) IVPB 200 mg  Status:  Discontinued     200 mg 100 mL/hr over 60 Minutes Intravenous Every 24 hours 02/18/16 0917 02/29/16 0847   02/17/16 2200  erythromycin 250 mg in sodium chloride 0.9 % 100 mL IVPB  Status:  Discontinued     250 mg 100 mL/hr over 60 Minutes Intravenous Every 12 hours 02/17/16 1720 02/17/16 1853   02/17/16 2100  erythromycin 250 mg in sodium chloride 0.9 % 100 mL IVPB  Status:  Discontinued     250 mg 100 mL/hr over 60 Minutes Intravenous Every 12  hours 02/17/16 1853 02/20/16 0737      Assessment/Plan: s/p Procedure(s): OPEN J TUBE (Left)  Heating pad.  Continue diflucan. Hold on feeds.  Will get repeat CT.      LOS: 11 days    Bradin Mcadory 03/01/2016

## 2016-03-02 DIAGNOSIS — K210 Gastro-esophageal reflux disease with esophagitis: Principal | ICD-10-CM

## 2016-03-02 DIAGNOSIS — R1110 Vomiting, unspecified: Secondary | ICD-10-CM

## 2016-03-02 LAB — GLUCOSE, CAPILLARY
GLUCOSE-CAPILLARY: 108 mg/dL — AB (ref 65–99)
GLUCOSE-CAPILLARY: 127 mg/dL — AB (ref 65–99)
GLUCOSE-CAPILLARY: 138 mg/dL — AB (ref 65–99)
GLUCOSE-CAPILLARY: 143 mg/dL — AB (ref 65–99)
Glucose-Capillary: 116 mg/dL — ABNORMAL HIGH (ref 65–99)
Glucose-Capillary: 120 mg/dL — ABNORMAL HIGH (ref 65–99)
Glucose-Capillary: 121 mg/dL — ABNORMAL HIGH (ref 65–99)

## 2016-03-02 LAB — MAGNESIUM: MAGNESIUM: 1.9 mg/dL (ref 1.7–2.4)

## 2016-03-02 LAB — CBC
HCT: 27.1 % — ABNORMAL LOW (ref 36.0–46.0)
Hemoglobin: 8.3 g/dL — ABNORMAL LOW (ref 12.0–15.0)
MCH: 29.5 pg (ref 26.0–34.0)
MCHC: 30.6 g/dL (ref 30.0–36.0)
MCV: 96.4 fL (ref 78.0–100.0)
PLATELETS: 250 10*3/uL (ref 150–400)
RBC: 2.81 MIL/uL — AB (ref 3.87–5.11)
RDW: 17.8 % — AB (ref 11.5–15.5)
WBC: 6.3 10*3/uL (ref 4.0–10.5)

## 2016-03-02 LAB — C DIFFICILE QUICK SCREEN W PCR REFLEX
C DIFFICILE (CDIFF) INTERP: NEGATIVE
C DIFFICILE (CDIFF) TOXIN: NEGATIVE
C Diff antigen: NEGATIVE

## 2016-03-02 LAB — BASIC METABOLIC PANEL
ANION GAP: 5 (ref 5–15)
BUN: 17 mg/dL (ref 6–20)
CALCIUM: 8.5 mg/dL — AB (ref 8.9–10.3)
CO2: 22 mmol/L (ref 22–32)
Chloride: 109 mmol/L (ref 101–111)
Creatinine, Ser: 0.7 mg/dL (ref 0.44–1.00)
GLUCOSE: 114 mg/dL — AB (ref 65–99)
POTASSIUM: 3.9 mmol/L (ref 3.5–5.1)
Sodium: 136 mmol/L (ref 135–145)

## 2016-03-02 LAB — HEPARIN LEVEL (UNFRACTIONATED): HEPARIN UNFRACTIONATED: 0.4 [IU]/mL (ref 0.30–0.70)

## 2016-03-02 LAB — PROTIME-INR
INR: 1.31 (ref 0.00–1.49)
PROTHROMBIN TIME: 16.4 s — AB (ref 11.6–15.2)

## 2016-03-02 MED ORDER — GI COCKTAIL ~~LOC~~
30.0000 mL | Freq: Three times a day (TID) | ORAL | Status: DC | PRN
  Filled 2016-03-02: qty 30

## 2016-03-02 MED ORDER — PANTOPRAZOLE SODIUM 40 MG PO TBEC
40.0000 mg | DELAYED_RELEASE_TABLET | Freq: Two times a day (BID) | ORAL | Status: DC
  Administered 2016-03-02 – 2016-03-06 (×9): 40 mg via ORAL
  Filled 2016-03-02 (×10): qty 1

## 2016-03-02 MED ORDER — TRACE MINERALS CR-CU-MN-SE-ZN 10-1000-500-60 MCG/ML IV SOLN
INTRAVENOUS | Status: DC
  Filled 2016-03-02: qty 1680

## 2016-03-02 MED ORDER — PANTOPRAZOLE SODIUM 40 MG PO PACK: 40.0000 mg | PACK | Freq: Every day | ORAL | Status: DC

## 2016-03-02 MED ORDER — FAT EMULSION 20 % IV EMUL
240.0000 mL | INTRAVENOUS | Status: AC
  Administered 2016-03-02: 240 mL via INTRAVENOUS
  Filled 2016-03-02: qty 250

## 2016-03-02 MED ORDER — FAT EMULSION 20 % IV EMUL
240.0000 mL | INTRAVENOUS | Status: DC
  Filled 2016-03-02: qty 250

## 2016-03-02 MED ORDER — TRACE MINERALS CR-CU-MN-SE-ZN 10-1000-500-60 MCG/ML IV SOLN
INTRAVENOUS | Status: AC
  Administered 2016-03-02: 18:00:00 via INTRAVENOUS
  Filled 2016-03-02: qty 1680

## 2016-03-02 MED ORDER — PREGABALIN 75 MG PO CAPS
75.0000 mg | ORAL_CAPSULE | Freq: Two times a day (BID) | ORAL | Status: DC
  Administered 2016-03-02 – 2016-03-16 (×27): 75 mg via ORAL
  Filled 2016-03-02 (×28): qty 1

## 2016-03-02 MED ORDER — PANTOPRAZOLE SODIUM 40 MG PO PACK: 40.0000 mg | PACK | Freq: Two times a day (BID) | ORAL | Status: DC

## 2016-03-02 MED ORDER — FAMOTIDINE IN NACL 20-0.9 MG/50ML-% IV SOLN
20.0000 mg | Freq: Two times a day (BID) | INTRAVENOUS | Status: DC
  Administered 2016-03-02 – 2016-03-07 (×11): 20 mg via INTRAVENOUS
  Filled 2016-03-02 (×13): qty 50

## 2016-03-02 MED ORDER — PROMETHAZINE HCL 25 MG/ML IJ SOLN
6.2500 mg | INTRAMUSCULAR | Status: DC | PRN
  Administered 2016-03-05 – 2016-03-06 (×2): 6.25 mg via INTRAVENOUS
  Filled 2016-03-02 (×2): qty 1

## 2016-03-02 MED ORDER — VITAL 1.5 CAL PO LIQD
1000.0000 mL | ORAL | Status: DC
  Administered 2016-03-02: 1000 mL
  Filled 2016-03-02: qty 1000

## 2016-03-02 MED ORDER — PANTOPRAZOLE SODIUM 40 MG PO TBEC
40.0000 mg | DELAYED_RELEASE_TABLET | Freq: Every day | ORAL | Status: DC
  Filled 2016-03-02: qty 1

## 2016-03-02 MED ORDER — PROMETHAZINE HCL 25 MG/ML IJ SOLN
6.2500 mg | Freq: Once | INTRAMUSCULAR | Status: AC
  Administered 2016-03-02: 6.25 mg via INTRAVENOUS
  Filled 2016-03-02: qty 1

## 2016-03-02 MED ORDER — SODIUM CHLORIDE 0.9 % IV SOLN
250.0000 mg | Freq: Four times a day (QID) | INTRAVENOUS | Status: DC
  Administered 2016-03-02 – 2016-03-04 (×7): 250 mg via INTRAVENOUS
  Filled 2016-03-02 (×11): qty 5

## 2016-03-02 NOTE — Progress Notes (Signed)
PT Cancellation Note  Patient Details Name: [name] MRN: [id number] DOB: [birth year]   Cancelled Treatment:    Reason Eval/Treat Not Completed: Medical issues which prohibited therapy. Pt sleeping with daughters reporting she was vomiting more today than yesterday. Daughters report pt is standing and using BSC when toileting and do leg exercises (when she can).    Edrees Valent 03/02/2016, 3:27 PM  Pager [phone]

## 2016-03-02 NOTE — Progress Notes (Signed)
Daily Rounding Note  03/02/2016, 11:57 AM  LOS: 12 days   SUBJECTIVE:       Dr Posey Pronto the hospitalist asks GI to revisit pt as she has persistent intractable N/V.  Seen 3 weeks ago for post gastrectomy dysfunction with n/v.   [name] is a 82 y.o. female [place]. 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 S/p repair patent ductus arteriosis. Aortic stenosis. CHF, EF 60-65 % in 11/1015. S/p CABG. 2015 left retinal artery occlusion with vision loss. S/p multiple orthopedic surgeries and joint replacements. PAF, on chronic Coumadin. CKD stage 3. HTN. Hypothyroidism. Strep Viridans PNA 11/2015. Hx renal cell cancer, s/p nephrectomy. Hx uterine cancer, s/p hysterectomy and radiation. S/p cholecystectomy. Enterococcal UTI treated with Amoxicillin in 11/2015. 2008 gastric emptying study showed 63% retained gastric contents at 2 hours, normal being less than 30% at 2 hours.   Anemia, FOBT + with Hgb to 7.9 in 11/2015. Received parenteral iron, started on Aranesp and B12 injections per oncology.  11/11/15 EGD. Gastric tumor at anterior wall, oozing blood and stigmata of recent bleeding. Path: atypical glandular tissue, suspicious for adenocarcinoma.  11/11/15 Colonoscopy. Normal colon except for decreased rectal sphincter tone.  No mets on CT. PET scan showed gastric lesion and hypermetabollic inguinal nodes.  01/15/16 Diagnostic laparoscopy, open subtotal gastrectomy. Pathology: poorly differentiated adenocarcinoma. Dysphagia post removal of NGT post op requiring multiple swallows to pass foods. Not seen by SLP . Also anorexia, early satiety. Cancer staged at T2 N0 5/14 UGI series: Expected postoperative appearance of the stomach following partial gastrectomy, without evidence of postoperative leak or obstruction. 5/19 UGI series: 1. Negative for postoperative leak or obstruction. 2. Positional drainage of  the remnant stomach. When LPO there was no drainage and large volume, symptomatic gastroesophageal reflux. 3. Distal esophagitis related to #2 or recent NG tube.  Discharged after 5/11 - 01/26/16 admission on Coumadin, BID Protonix, QID Sucralfate, full liquid/soft diet, Ensure and protein supplements. Protein cal malnutrition noted in discharge summary.  Readmitted 6/13 with dysphagia, regurgitation, weight loss.  5/30 abdominal xray: Gasless abdomen, a nonspecific finding that limits sensitivity. No indication of bowel obstruction or perforation. 5/31 Esophagram: Mild Presbyesophagus, no obstruction, no leak, no food impaction.   02/05/15 EGD by Dr Fuller Plan: LA grade D esophagitis,  Non-bleeding gastric ulcer at subtotal gastrectomy anastomosis. 02/24/16 laparotomy with LOA and J tube placement. 6/25 KUB with mild colonic ileus.   03/01/16 CT ab/pelvis: 3 x 6 cm hematoma of anterior abd wall near surgical incision.   Since admission she and dtrs says n/v has never resolved.  It starts of clear and ends up greenish/bilious.   Jejunal tube feeds stopped on 6/24 after brief low rate trial due to n/v. On TPN since 6/16. BID Protonix po, IV Pepcid.  PRN zofran using about 1 to 2 times per day.  Phenergen used 2 times in last 24 hours, discontinued this AM. Got hives from Reglan in past.  No abdominal pain, just TTT at incision and same area hurts during vigorous vomiting.  C/o abd pain, no narcotics in use. Last C diff check was on 6/3: negative. Currently has 4 or 5 small, watery to loose, non-bloody stools per day.       Scheduled Meds: . antiseptic oral rinse  7 mL Mouth Rinse BID  . famotidine (PEPCID) IV  20 mg Intravenous Q12H  . insulin aspart  0-9 Units Subcutaneous Q4H  . levothyroxine  25 mcg  Intravenous Daily  . lidocaine  1 patch Transdermal Q24H  . metoprolol  10 mg Intravenous Q6H  . pantoprazole  40 mg Oral BID  . pregabalin  75 mg Oral BID   Continuous Infusions: . sodium  chloride 10 mL/hr at 02/20/16 2216  . Marland KitchenTPN (CLINIMIX-E) Adult 70 mL/hr at 03/01/16 1755   And  . fat emulsion 240 mL (03/01/16 1755)  . Marland KitchenTPN (CLINIMIX-E) Adult     And  . fat emulsion     PRN Meds:.acetaminophen **OR** acetaminophen, alum & mag hydroxide-simeth, diltiazem, LORazepam, methocarbamol (ROBAXIN)  IV, ondansetron **OR** ondansetron (ZOFRAN) IV, promethazine, sodium chloride flush        OBJECTIVE:         Vital signs in last 24 hours:    Temp:  [98 F (36.7 C)-99 F (37.2 C)] 98 F (36.7 C) (06/27 0730) Pulse Rate:  [86-101] 101 (06/27 0730) Resp:  [18] 18 (06/27 0730) BP: (102-141)/(45-85) 141/61 mmHg (06/27 0730) SpO2:  [97 %-100 %] 98 % (06/27 0730) Weight:  [63.277 kg (139 lb 8 oz)] 63.277 kg (139 lb 8 oz) (06/27 0526) Last BM Date: 03/01/16 Filed Weights   02/29/16 0455 03/01/16 0423 03/02/16 0526  Weight: 64.456 kg (142 lb 1.6 oz) 64.592 kg (142 lb 6.4 oz) 63.277 kg (139 lb 8 oz)   General: frail, weak, elderly, uncomfortable WF   Heart: tachy, regular Chest:  Clear but overall diminished BS.   Abdomen: soft, ND.  Tender over gauze in LLQ.  No guard or rebound  Extremities: no CCE Neuro/Psych:  No tremor, no gross deficits.  Moves all 4s.    Intake/Output from previous day: 06/26 0701 - 06/27 0700 In: 8902.8 [P.O.:20; I.V.:1597; [id number]; IV Piggyback:255; [id number] Out: 2401 [Urine:2400; Stool:1]  Intake/Output this shift: Total I/O In: 15 [P.O.:15] Out: 400 [Urine:400]  Lab Results:  Recent Labs  02/29/16 0410 03/01/16 0500 03/02/16 0515  WBC 6.9 6.6 6.3  HGB 8.0* 8.0* 8.3*  HCT 25.1* 25.6* 27.1*  PLT 233 230 250   BMET  Recent Labs  02/29/16 0410 03/01/16 0500 03/02/16 0515  NA 135 136 136  K 4.0 4.0 3.9  CL 108 107 109  CO2 22 23 22   GLUCOSE 109* 116* 114*  BUN 17 16 17   CREATININE 0.66 0.73 0.70  CALCIUM 8.5* 8.5* 8.5*   LFT  Recent Labs  03/01/16 0500  PROT 5.2*  ALBUMIN 2.1*  AST 44*  ALT 44  ALKPHOS 117    BILITOT 0.6   PT/INR  Recent Labs  03/01/16 0500 03/02/16 0515  LABPROT 16.4* 16.4*  INR 1.30 1.31   Hepatitis Panel No results for input(s): HEPBSAG, HCVAB, HEPAIGM, HEPBIGM in the last 72 hours.  Studies/Results: Ct Abdomen Pelvis W Contrast  03/01/2016  CLINICAL DATA:  Postop from subtotal gastrectomy and J-tube placement for gastric carcinoma. Persistent nausea and vomiting. Previous left nephrectomy for renal cell carcinoma. EXAM: CT ABDOMEN AND PELVIS WITH CONTRAST TECHNIQUE: Multidetector CT imaging of the abdomen and pelvis was performed using the standard protocol following bolus administration of intravenous contrast. CONTRAST:  161mL ISOVUE-300 IOPAMIDOL (ISOVUE-300) INJECTION 61% COMPARISON:  02/17/2016 FINDINGS: Lower chest: Tiny right pleural effusion and mild bibasilar atelectasis are increased since prior study. Hepatobiliary: No masses or other significant abnormality. Prior cholecystectomy noted. No evidence of biliary dilatation. Pancreas: No mass, inflammatory changes, or other significant abnormality. Spleen: Within normal limits in size and appearance. Adrenals/Urinary Tract: Adrenal glands are normal in appearance. Right kidney is also normal in  appearance. Previous left nephrectomy noted. Unopacified urinary bladder is unremarkable in appearance. Stomach/Bowel: Postop changes from subtotal gastrectomy appears stable. A percutaneous jejunostomy tube is now seen in place. No evidence of bowel obstruction, inflammatory process, or abnormal fluid collections. Vascular/Lymphatic: No pathologically enlarged lymph nodes. No evidence of abdominal aortic aneurysm. Aortic atherosclerosis noted. Reproductive: Prior hysterectomy noted. Adnexal regions are unremarkable in appearance. Other: New hematoma seen within the anterior abdominal wall subcutaneous tissues at site of surgical incision which measures 3 x 6 cm. Several tiny foci of intraperitoneal air noted just deep to the anterior  abdominal wall subcutaneous tissues, consistent with minimal residual postop intraperitoneal air. Musculoskeletal: Beam hardening artifact through the inferior pelvis from right hip prosthesis. No suspicious bone lesions identified. IMPRESSION: New hematoma seen anterior abdominal wall subcutaneous tissues at surgical incision site measuring 3 x 6 cm. No evidence of intraperitoneal hematoma or abscess Increased tiny left pleural effusion and bibasilar atelectasis. Aortic atherosclerosis noted. Electronically Signed   By: Earle Gell M.D.   On: 03/01/2016 15:32    ASSESMENT:   *  N/V in complicated pt s/p [id number] subtotal gastrectomy for gastric adenoncarcinoma.  Severe esophagitis on 6/1 EGD, 6/20 laparotomy/LOA/J tube placement.  6/26 abdominal wall hematoma at area of incision is unlikely to be contributing to n/v.      *  Post op anemia in [employer].  S/p Aranesp and Feraheme.    PLAN   *  Per Dr Silverio Decamp.  If we repeat EGD, suspect it will show similar findings to those of 6/1 EGD [place] d/w Dr Barry Dienes and agree that j tube feedings ought to be restarted as she has n/v with and without jejunal feedings.  Wonder about paring down to essential meds ( is Lyrica essential? She has been on it for a long time.   Unable to add Reglan as hx of hives from this in past.   ? Scheduled antiemetics?     [name]  03/02/2016, 11:57 AM Pager: [phone]

## 2016-03-02 NOTE — Progress Notes (Signed)
PARENTERAL NUTRITION CONSULT NOTE - FOLLOW UP  Pharmacy Consult for TPN Indication: intolerance to enteral feeds  Allergies  Allergen Reactions  . Amiodarone Other (See Comments)     Thyroid and liver and kidney problems  . Codeine Nausea And Vomiting  . Gabapentin Itching  . Hydrocodone Other (See Comments)    hallucination  . Morphine And Related Nausea And Vomiting  . Requip [Ropinirole Hcl]     Headache   . Zinc Nausea Only    nausea  . Reglan [Metoclopramide] Hives, Itching and Rash    Made face red, also    Patient Measurements: Height: 5\' 1"  (154.9 cm) Weight: 139 lb 8 oz (63.277 kg) IBW/kg (Calculated) : 47.8   Vital Signs: Temp: 98 F (36.7 C) (06/27 0730) Temp Source: Oral (06/27 0730) BP: 141/61 mmHg (06/27 0730) Pulse Rate: 101 (06/27 0730) Intake/Output from previous day: 06/26 0701 - 06/27 0700 In: 8902.8 [P.O.:20; I.V.:1597; OV:[id number]; IV Piggyback:255; [id number] Out: 2401 [Urine:2400; Stool:1] Intake/Output from this shift:    Labs:  Recent Labs  02/29/16 0410 03/01/16 0500 03/02/16 0515  WBC 6.9 6.6 6.3  HGB 8.0* 8.0* 8.3*  HCT 25.1* 25.6* 27.1*  PLT 233 230 250  INR 1.24 1.30 1.31     Recent Labs  02/29/16 0410 03/01/16 0500 03/02/16 0515  NA 135 136 136  K 4.0 4.0 3.9  CL 108 107 109  CO2 22 23 22   GLUCOSE 109* 116* 114*  BUN 17 16 17   CREATININE 0.66 0.73 0.70  CALCIUM 8.5* 8.5* 8.5*  MG 1.8 1.7 1.9  PHOS  --  4.5  --   PROT  --  5.2*  --   ALBUMIN  --  2.1*  --   AST  --  44*  --   ALT  --  44  --   ALKPHOS  --  117  --   BILITOT  --  0.6  --   PREALBUMIN  --  13.0*  --   TRIG  --  93  --    Estimated Creatinine Clearance: 47.8 mL/min (by C-G formula based on Cr of 0.7).    Recent Labs  03/01/16 2001 03/02/16 0022 03/02/16 0348  GLUCAP 125* 127* 108*    Medications:  Scheduled:  . antiseptic oral rinse  7 mL Mouth Rinse BID  . fluconazole (DIFLUCAN) IV  200 mg Intravenous Q24H  . insulin aspart   0-9 Units Subcutaneous Q4H  . levothyroxine  25 mcg Intravenous Daily  . lidocaine  1 patch Transdermal Q24H  . metoprolol  10 mg Intravenous Q6H  . pantoprazole sodium  40 mg Per Tube Daily  . pregabalin  75 mg Oral BID    Insulin Requirements in the past 24 hours:  4 units on SSI sensitive q4h  Admit: 7 YOF with history of kidney and gastric cancer, s/p proximal gastrectomy in early May. She was readmitted earlier in June with unretractable N/V and was on TPN at that time. This was discontinued prior to her going home- confirmed with Wynona Luna from [facility].   Surgeries/Procedures: 6/20: Jtube placed  GI: GERD / anal fissure / gastric cancer. EGD during last admission showed esophagitis, gastric ulcer at gastric anastomosis, was to be on liquids only for 2 weeks at home. S/P Emycin for gastroparesis (6/13 >> 6/16).  JT placed & have attempted resuming TF x 2, but stopped each time after 6hr d/t N/V (6/21 & 6/24).  Prealbumin 19.7>12.9>13.  Changed to NPO  6/25. Mostly IV meds, passing flatus, had BM 6/24. Pt with abdominal pain/vomiting noted on 6/26 - CT showed a new anterior abdominal wall hematoma, continuing to hold po feeds. Pepcid IV in TPN  Endo: hypothyroid on Synthroid- back to IV 6/25. No hx DM. CBGs 108-145 on SSI  Lytes: K 3.9, Mag 1.9, Phos 4.5 (6/26), CoCa ~9.9 -    Renal: SCr 0.7 stable, CrCl ~40-36mL/min, good UOP  Pulm: 98%/RA  Cards: PAF / CAD / AS / tachy-brady syndrome post PPM / HTN / HLD / CHF. BP soft-wnl, HR wnl-tachy. On lopressor IV  Hepatobil: LFTs wnl, albumin 2.1, TG 93 (6/26)  Neuro: neuropathy / RLS / migraine / anxiety / hx CVA. - Pregabalin, lidocaine TD, robaxin prn  ID: Pressure ulcer identified - esophagitis - Fluconazole- now IV thru 6/27.  Afeb, WBC wnl  AC/heme: IV heparin->warfarin for Afib, Hb down to 8, pltc wnl  Best Practices: IV heparin/warfarin, MC  TPN Access: PICC placed 6/14 TPN start date: 6/14>>  Current  Nutrition:  Clinimix E 5/15 at 73mL/hr + IV lipids at 29mL/hr - this provides 84g protein and 1672kcal which meets 100% of needs NPO  Nutritional Goals: (per RD recommendations 6/26) 1500-1700 kCal, 75-90 grams of protein per day  Plan:  - Continue Clinimix E 5/15 at 95mL/hr + IV lipids at 4mL/hr - MVI and trace elements in TPN - Pepcid 40mg  in TPN - Continue sensitive SSI and CBGs q4h - BMET, Mg in AM  Alycia Rossetti, PharmD, BCPS Clinical Pharmacist Pager: [phone] 03/02/2016 8:16 AM

## 2016-03-02 NOTE — Progress Notes (Signed)
[facility] Progress Note  Patient: [name] [id number]   PCP: [name], MD DOB: September 12, 1933   DOA: 02/17/2016   DOS: 03/02/2016   Date of Service: the patient was seen and examined on 03/02/2016  Subjective: Persistent episodes of recurrent nausea and vomiting. Abdominal pain is improving. No bowel movement. Nutrition: Currently minimal oral intake due to intolerance  Brief hospital course: Pt. with PMH of A. fib, CKD,HTN, gastric cancer S/P proximal gastrectomy 01/2016; admitted on 02/17/2016, with complaint of vomiting, was found to have suspected esophagitis. Patient was given IV Diflucan, IV PPI and was started on TPN. Also started on IV erythromycin for motility. With initial treatment the patient was able to tolerate by mouth pills but no food intake and therefore it was decided to place a J-tube, procedure done on 02/24/2016. Since the procedure pt has developed ileus which Appears to be improving. She also has anemia which appears to be dilutional and refusing blood transfusion. CT abdomen shows evidence of hematoma with any active bleeding. No other acute abnormality. Currently further plan is monitor for recovery from ileus  Assessment and Plan: 1. Gastroesophageal reflux disease with esophagitis from esophagitis. Gastric adenocarcinoma GERD with esophagitis Recent subtotal gastrectomy for gastric adenocarcinoma May 2017 Surgical site hematoma.  Minimal oral intake by mouth despite improvement in nausea S/P G-tube placement 02/24/2016. Reoccurrence of nausea and vomiting after initiation of the tube feeding. BACK and forth on tube feeding and remaining nothing by mouth. Continue IV Pepcid, continue TPN via PICC line. On clear liquid diet, G-tube to gravity only at present-24 hour output 120 mL, Diflucan for thrush, for a total 14 days, last day 03/02/2016 Feedings are initiated. Also add Protonix orally. Erythromycin scheduled as well. Appreciate GI and  general surgery consultation.  2. Paroxysmal A. fib. Essential hypertension, blood pressure well controlled Pacemaker implant Chadsvasc 5. On warfarin  Currently rate controlled. Continue Cardizem when necessary,  Change IV lopressor Was given Lovenox preprocedure, postprocedure on heparin for anticoagulation. At present the patient has development of surgical site hematoma and therefore I will hold off on anticoagulation.  3. Hypothyroidism. Changing to IV Synthroid at present We will continue to monitor.  4. Dehydration. Mild renal insufficiency. Improvement with IV hydration. We will continue to monitor.  5. Postoperative blood loss anemia. Patient's hemoglobin dropped from 12-8.0. No evidence of active bleeding. Patient is 4 L positive between these 2 CBC check. This likely appears dilutional. Patient is refusing blood transfusion,  S/P IV Feraheme 5101, [place] Aranesp 1 as well as injection B-12 on 02/27/2016, after discussion with patient's hematology Dr Irene Limbo.  Pain management: When necessary Robaxin, when necessary tramadol, lidocaine patch Activity: Consulted physical therapy, home health 24-hour supervision recommended Bowel regimen: last BM 02/28/2016 Diet: feeding initiated, continue TPN DVT Prophylaxis: on therapeutic anticoagulation.  Advance goals of care discussion: DNR/DNI  Family Communication: family was present at bedside, at the time of interview. The pt provided permission to discuss medical plan with the family. Opportunity was given to ask question and all questions were answered satisfactorily.   Disposition:  Discharge to home with home health. Expected discharge date: 03/05/2016, pending improvement feeding tolerance  Consultants: Gen. surgery Procedures: G-tube placement, PICC line placement, TPN  Antibiotics: Anti-infectives    Start     Dose/Rate Route Frequency Ordered Stop   03/02/16 1530  erythromycin 250 mg in sodium chloride 0.9 % 100 mL  IVPB     250 mg 100 mL/hr over 60 Minutes Intravenous Every 6  hours 03/02/16 1424     02/29/16 1000  fluconazole (DIFLUCAN) IVPB 200 mg     200 mg 100 mL/hr over 60 Minutes Intravenous Every 24 hours 02/29/16 0847 03/02/16 1111   02/24/16 1000  ceFAZolin (ANCEF) IVPB 2g/100 mL premix    Comments:  On call to OR.   2 g 200 mL/hr over 30 Minutes Intravenous To [facility]Stay [facility] 02/24/16 0848 02/24/16 1215   02/18/16 1000  fluconazole (DIFLUCAN) IVPB 200 mg  Status:  Discontinued     200 mg 100 mL/hr over 60 Minutes Intravenous Every 24 hours 02/18/16 0917 02/29/16 0847   02/17/16 2200  erythromycin 250 mg in sodium chloride 0.9 % 100 mL IVPB  Status:  Discontinued     250 mg 100 mL/hr over 60 Minutes Intravenous Every 12 hours 02/17/16 1720 02/17/16 1853   02/17/16 2100  erythromycin 250 mg in sodium chloride 0.9 % 100 mL IVPB  Status:  Discontinued     250 mg 100 mL/hr over 60 Minutes Intravenous Every 12 hours 02/17/16 1853 02/20/16 0737        Intake/Output Summary (Last 24 hours) at 03/02/16 1645 Last data filed at 03/02/16 1140  Gross per 24 hour  Intake 8917.8 ml  Output   2000 ml  Net 6917.8 ml   Filed Weights   02/29/16 0455 03/01/16 0423 03/02/16 0526  Weight: 64.456 kg (142 lb 1.6 oz) 64.592 kg (142 lb 6.4 oz) 63.277 kg (139 lb 8 oz)    Objective: Physical Exam: Filed Vitals:   03/01/16 1529 03/01/16 2100 03/02/16 0526 03/02/16 0730  BP: 113/64 102/45 133/85 141/61  Pulse: 98 86 88 101  Temp: 98.1 F (36.7 C) 99 F (37.2 C) 98 F (36.7 C) 98 F (36.7 C)  TempSrc: Oral Oral Oral Oral  Resp: 18 18 18 18   Height:      Weight:   63.277 kg (139 lb 8 oz)   SpO2: 97% 98% 100% 98%    General: Alert, Awake and Oriented to Time, Place and Person. Appear in mild distress Eyes: PERRL, Conjunctiva normal ENT: Oral Mucosa clear moist. Cardiovascular: S1 and S2 Present, no Murmur, Respiratory: Bilateral Air entry equal and Decreased, Clear to Auscultation, no  Crackles, no wheezes Abdomen: Bowel Sound Absent, Soft and mild tenderness Skin: no redness, no Rash  Extremities: no Pedal edema, no calf tenderness Neurologic: Grossly no focal neuro deficit. Bilaterally Equal motor strength  Data Reviewed: CBC:  Recent Labs Lab 02/27/16 0520 02/28/16 0445 02/29/16 0410 03/01/16 0500 03/02/16 0515  WBC 7.1 7.2 6.9 6.6 6.3  NEUTROABS  --   --   --  4.0  --   HGB 8.0* 8.1* 8.0* 8.0* 8.3*  HCT 25.0* 25.2* 25.1* 25.6* 27.1*  MCV 92.6 92.6 93.3 94.8 96.4  PLT 196 201 233 230 [id number]   Basic Metabolic Panel:  Recent Labs Lab 02/26/16 0518 02/27/16 0520 02/28/16 0445 02/29/16 0410 03/01/16 0500 03/02/16 0515  NA 135 135 135 135 136 136  K 4.0 3.9 4.0 4.0 4.0 3.9  CL 107 108 108 108 107 109  CO2 22 22 19* 22 23 22   GLUCOSE 106* 115* 114* 109* 116* 114*  BUN 21* 21* 19 17 16 17   CREATININE 0.69 0.69 0.70 0.66 0.73 0.70  CALCIUM 8.4* 8.1* 8.6* 8.5* 8.5* 8.5*  MG 1.7 1.8 1.9 1.8 1.7 1.9  PHOS 3.8 4.4  --   --  4.5  --     Liver Function Tests:  Recent Labs  Lab 02/26/16 0518 03/01/16 0500  AST 23 44*  ALT 13* 44  ALKPHOS 81 117  BILITOT 0.7 0.6  PROT 5.0* 5.2*  ALBUMIN 2.1* 2.1*   No results for input(s): LIPASE, AMYLASE in the last 168 hours. No results for input(s): AMMONIA in the last 168 hours. Coagulation Profile:  Recent Labs Lab 02/27/16 0520 02/28/16 0445 02/29/16 0410 03/01/16 0500 03/02/16 0515  INR 1.35 1.38 1.24 1.30 1.31   Cardiac Enzymes: No results for input(s): CKTOTAL, CKMB, CKMBINDEX, TROPONINI in the last 168 hours. BNP (last 3 results) No results for input(s): PROBNP in the last 8760 hours.  CBG:  Recent Labs Lab 03/01/16 2001 03/02/16 0022 03/02/16 0348 03/02/16 0842 03/02/16 1101  GLUCAP 125* 127* 108* 143* 138*    Studies: No results found.   Scheduled Meds: . antiseptic oral rinse  7 mL Mouth Rinse BID  . erythromycin  250 mg Intravenous Q6H  . famotidine (PEPCID) IV  20 mg  Intravenous Q12H  . insulin aspart  0-9 Units Subcutaneous Q4H  . levothyroxine  25 mcg Intravenous Daily  . lidocaine  1 patch Transdermal Q24H  . metoprolol  10 mg Intravenous Q6H  . pantoprazole  40 mg Oral BID  . pregabalin  75 mg Oral BID   Continuous Infusions: . sodium chloride 10 mL/hr at 02/20/16 2216  . Marland KitchenTPN (CLINIMIX-E) Adult 70 mL/hr at 03/01/16 1755   And  . fat emulsion 240 mL (03/01/16 1755)  . Marland KitchenTPN (CLINIMIX-E) Adult     And  . fat emulsion    . feeding supplement (VITAL 1.5 CAL) 1,000 mL (03/02/16 1446)   PRN Meds: acetaminophen **OR** acetaminophen, alum & mag hydroxide-simeth, diltiazem, gi cocktail, LORazepam, methocarbamol (ROBAXIN)  IV, ondansetron **OR** ondansetron (ZOFRAN) IV, promethazine, sodium chloride flush  Time spent: 30 minutes  Author: Berle Mull, MD [facility] Pager: [phone] 03/02/2016 4:45 PM  If 7PM-7AM, please contact night-coverage at [email or web address], password [email or web address]

## 2016-03-03 DIAGNOSIS — C169 Malignant neoplasm of stomach, unspecified: Secondary | ICD-10-CM

## 2016-03-03 DIAGNOSIS — Z903 Acquired absence of stomach [part of]: Secondary | ICD-10-CM

## 2016-03-03 DIAGNOSIS — K3184 Gastroparesis: Secondary | ICD-10-CM

## 2016-03-03 LAB — HEPARIN LEVEL (UNFRACTIONATED)

## 2016-03-03 LAB — PROTIME-INR
INR: 1.15 (ref 0.00–1.49)
PROTHROMBIN TIME: 14.9 s (ref 11.6–15.2)

## 2016-03-03 LAB — BASIC METABOLIC PANEL
ANION GAP: 8 (ref 5–15)
BUN: 17 mg/dL (ref 6–20)
CO2: 21 mmol/L — AB (ref 22–32)
Calcium: 8.6 mg/dL — ABNORMAL LOW (ref 8.9–10.3)
Chloride: 105 mmol/L (ref 101–111)
Creatinine, Ser: 0.77 mg/dL (ref 0.44–1.00)
GFR calc Af Amer: >60 mL/min (ref >60)
GLUCOSE: 295 mg/dL — AB (ref 65–99)
POTASSIUM: 4.2 mmol/L (ref 3.5–5.1)
Sodium: 134 mmol/L — ABNORMAL LOW (ref 135–145)

## 2016-03-03 LAB — MAGNESIUM: Magnesium: 1.8 mg/dL (ref 1.7–2.4)

## 2016-03-03 LAB — GLUCOSE, CAPILLARY
GLUCOSE-CAPILLARY: 88 mg/dL (ref 65–99)
Glucose-Capillary: 114 mg/dL — ABNORMAL HIGH (ref 65–99)
Glucose-Capillary: 131 mg/dL — ABNORMAL HIGH (ref 65–99)
Glucose-Capillary: 124 mg/dL — ABNORMAL HIGH (ref 65–99)
Glucose-Capillary: 123 mg/dL — ABNORMAL HIGH (ref 65–99)

## 2016-03-03 LAB — CBC
HEMATOCRIT: 28.3 % — AB (ref 36.0–46.0)
HEMOGLOBIN: 9 g/dL — AB (ref 12.0–15.0)
MCH: 31.6 pg (ref 26.0–34.0)
MCHC: 31.8 g/dL (ref 30.0–36.0)
MCV: 99.3 fL (ref 78.0–100.0)
Platelets: 249 10*3/uL (ref 150–400)
RBC: 2.85 MIL/uL — AB (ref 3.87–5.11)
RDW: 18.6 % — ABNORMAL HIGH (ref 11.5–15.5)
WBC: 6.7 10*3/uL (ref 4.0–10.5)

## 2016-03-03 MED ORDER — ENOXAPARIN SODIUM 40 MG/0.4ML ~~LOC~~ SOLN
40.0000 mg | SUBCUTANEOUS | Status: DC
  Administered 2016-03-03 – 2016-03-16 (×14): 40 mg via SUBCUTANEOUS
  Filled 2016-03-03 (×14): qty 0.4

## 2016-03-03 MED ORDER — M.V.I. ADULT IV INJ
INJECTION | INTRAVENOUS | Status: AC
  Administered 2016-03-03: 18:00:00 via INTRAVENOUS
  Filled 2016-03-03: qty 1680

## 2016-03-03 MED ORDER — MAGNESIUM SULFATE 2 GM/50ML IV SOLN
2.0000 g | Freq: Once | INTRAVENOUS | Status: AC
  Administered 2016-03-03: 2 g via INTRAVENOUS
  Filled 2016-03-03: qty 50

## 2016-03-03 MED ORDER — FAT EMULSION 20 % IV EMUL
240.0000 mL | INTRAVENOUS | Status: AC
  Administered 2016-03-03: 240 mL via INTRAVENOUS
  Filled 2016-03-03: qty 250

## 2016-03-03 MED ORDER — VITAL 1.5 CAL PO LIQD
1000.0000 mL | ORAL | Status: DC
Start: 2016-03-03 — End: 2016-03-04
  Administered 2016-03-03: 1000 mL
  Filled 2016-03-03: qty 1000

## 2016-03-03 NOTE — Progress Notes (Signed)
[facility] Progress Note  Patient: [name] [id number]   PCP: [name], MD DOB: 1933-11-19   DOA: 02/17/2016   DOS: 03/03/2016   Date of Service: the patient was seen and examined on 03/03/2016  Subjective: Significant improvement in nausea with a complete resolution as well as no episodes of vomiting. Abdominal pain is also getting better. Patient also had a bowel movement. No active bleeding. Breathing is better. Nutrition: Currently minimal oral intake due to intolerance  Brief hospital course: Pt. with PMH of A. fib, CKD,HTN, gastric cancer S/P proximal gastrectomy 01/2016; admitted on 02/17/2016, with complaint of vomiting, was found to have suspected esophagitis. Patient was given IV Diflucan, IV PPI and was started on TPN. Also started on IV erythromycin for motility. With initial treatment the patient was able to tolerate by mouth pills but no food intake and therefore it was decided to place a J-tube, procedure done on 02/24/2016. Since the procedure pt has developed ileus which Appears to be improving. She also has anemia which appears to be dilutional and refusing blood transfusion. CT abdomen shows evidence of hematoma with any active bleeding. No other acute abnormality. Currently further plan is monitor for recovery from ileus  Assessment and Plan: 1. Gastroesophageal reflux disease with esophagitis from esophagitis. Gastric adenocarcinoma GERD with esophagitis Recent subtotal gastrectomy for gastric adenocarcinoma May 2017 Surgical site hematoma.  Minimal oral intake by mouth despite improvement in nausea S/P G-tube placement 02/24/2016. Reoccurrence of nausea and vomiting after initiation of the tube feeding. BACK and forth on tube feeding and remaining nothing by mouth. Continue IV Pepcid, continue TPN via PICC line. Nothing by mouth except pills, tolerating tube feeding at a trickle rate via J-tube Diflucan for thrush, for a total 14 days, last  day 03/02/2016 Continue Protonix orally. Continue Erythromycin scheduled as well until tube feedings are at goal. Appreciate GI and general surgery consultation.  2. Paroxysmal A. fib. Essential hypertension, blood pressure well controlled Pacemaker implant Chadsvasc 5. On warfarin  Currently rate controlled. Continue Cardizem when necessary,  On IV lopressor Was given Lovenox preprocedure, postprocedure on heparin for anticoagulation. At present the patient has development of surgical site hematoma and therefore I will hold off on therapeutic anticoagulation. While the patient is a high risk for developing DVTs due to her lack of mobility I will continue prophylactic Lovenox dose.  3. Hypothyroidism. Changing to IV Synthroid at present We will continue to monitor.  4. Dehydration. Mild renal insufficiency. Improvement with IV hydration. We will continue to monitor.  5. Postoperative blood loss anemia. Surgical site hematoma, stable Patient's hemoglobin dropped from 12-8.0. No evidence of active bleeding. Patient is 4 L positive between these 2 CBC check. This likely appears dilutional. Patient is refusing blood transfusion,  S/P IV Feraheme 5101, [place] Aranesp 1 as well as injection B-12 on 02/27/2016, after discussion with patient's hematology Dr Irene Limbo. CT abdomen showed evidence of surgical site hematoma what appears to be stable at present. Discontinue therapeutic doses of anticoagulation and use only prophylactic dose of anticoagulation.  Pain management: When necessary Robaxin, when necessary tramadol, lidocaine patch Activity: Consulted physical therapy, home health 24-hour supervision recommended Bowel regimen: last BM 03/02/2016 Diet: feeding initiated, continue TPN DVT Prophylaxis: on prophylactic Lovenox.  Advance goals of care discussion: DNR/DNI  Family Communication: family was present at bedside, at the time of interview. The pt provided permission to discuss  medical plan with the family. Opportunity was given to ask question and all questions were answered  satisfactorily.   Disposition:  Discharge to home with home health. Expected discharge date: 03/05/2016, pending improvement feeding tolerance  Consultants: Gen. surgery Procedures: G-tube placement, PICC line placement, TPN  Antibiotics: Anti-infectives    Start     Dose/Rate Route Frequency Ordered Stop   03/02/16 1530  erythromycin 250 mg in sodium chloride 0.9 % 100 mL IVPB     250 mg 100 mL/hr over 60 Minutes Intravenous Every 6 hours 03/02/16 1424     02/29/16 1000  fluconazole (DIFLUCAN) IVPB 200 mg     200 mg 100 mL/hr over 60 Minutes Intravenous Every 24 hours 02/29/16 0847 03/02/16 1111   02/24/16 1000  ceFAZolin (ANCEF) IVPB 2g/100 mL premix    Comments:  On call to OR.   2 g 200 mL/hr over 30 Minutes Intravenous To [facility] 02/24/16 0848 02/24/16 1215   02/18/16 1000  fluconazole (DIFLUCAN) IVPB 200 mg  Status:  Discontinued     200 mg 100 mL/hr over 60 Minutes Intravenous Every 24 hours 02/18/16 0917 02/29/16 0847   02/17/16 2200  erythromycin 250 mg in sodium chloride 0.9 % 100 mL IVPB  Status:  Discontinued     250 mg 100 mL/hr over 60 Minutes Intravenous Every 12 hours 02/17/16 1720 02/17/16 1853   02/17/16 2100  erythromycin 250 mg in sodium chloride 0.9 % 100 mL IVPB  Status:  Discontinued     250 mg 100 mL/hr over 60 Minutes Intravenous Every 12 hours 02/17/16 1853 02/20/16 0737        Intake/Output Summary (Last 24 hours) at 03/03/16 1730 Last data filed at 03/03/16 1451  Gross per 24 hour  Intake 5267.84 ml  Output   1252 ml  Net 4015.84 ml   Filed Weights   03/01/16 0423 03/02/16 0526 03/03/16 0441  Weight: 64.592 kg (142 lb 6.4 oz) 63.277 kg (139 lb 8 oz) 61.825 kg (136 lb 4.8 oz)    Objective: Physical Exam: Filed Vitals:   03/02/16 0730 03/02/16 2010 03/03/16 0441 03/03/16 1128  BP: 141/61 121/54 139/55 119/73  Pulse: 101 98 92 98    Temp: 98 F (36.7 C) 98.6 F (37 C) 98.3 F (36.8 C) 98.4 F (36.9 C)  TempSrc: Oral Oral Oral Oral  Resp: 18 18 18 18   Height:      Weight:   61.825 kg (136 lb 4.8 oz)   SpO2: 98% 99% 99% 100%    General: Alert, Awake and Oriented to Time, Place and Person. Appear in mild distress Eyes: PERRL, Conjunctiva normal ENT: Oral Mucosa clear moist. Cardiovascular: S1 and S2 Present, no Murmur, Respiratory: Bilateral Air entry equal and Decreased, Clear to Auscultation, no Crackles, no wheezes Abdomen: Bowel Sound Absent, Soft and mild tenderness Skin: no redness, no Rash  Extremities: no Pedal edema, no calf tenderness Neurologic: Grossly no focal neuro deficit. Bilaterally Equal motor strength  Data Reviewed: CBC:  Recent Labs Lab 02/28/16 0445 02/29/16 0410 03/01/16 0500 03/02/16 0515 03/03/16 0445  WBC 7.2 6.9 6.6 6.3 6.7  NEUTROABS  --   --  4.0  --   --   HGB 8.1* 8.0* 8.0* 8.3* 9.0*  HCT 25.2* 25.1* 25.6* 27.1* 28.3*  MCV 92.6 93.3 94.8 96.4 99.3  PLT 201 233 230 250 0000000   Basic Metabolic Panel:  Recent Labs Lab 02/26/16 0518 02/27/16 0520 02/28/16 0445 02/29/16 0410 03/01/16 0500 03/02/16 0515 03/03/16 0445  NA 135 135 135 135 136 136 134*  K 4.0 3.9 4.0 4.0 4.0  3.9 4.2  CL 107 108 108 108 107 109 105  CO2 22 22 19* 22 23 22  21*  GLUCOSE 106* 115* 114* 109* 116* 114* 295*  BUN 21* 21* 19 17 16 17 17   CREATININE 0.69 0.69 0.70 0.66 0.73 0.70 0.77  CALCIUM 8.4* 8.1* 8.6* 8.5* 8.5* 8.5* 8.6*  MG 1.7 1.8 1.9 1.8 1.7 1.9 1.8  PHOS 3.8 4.4  --   --  4.5  --   --     Liver Function Tests:  Recent Labs Lab 02/26/16 0518 03/01/16 0500  AST 23 44*  ALT 13* 44  ALKPHOS 81 117  BILITOT 0.7 0.6  PROT 5.0* 5.2*  ALBUMIN 2.1* 2.1*   No results for input(s): LIPASE, AMYLASE in the last 168 hours. No results for input(s): AMMONIA in the last 168 hours. Coagulation Profile:  Recent Labs Lab 02/28/16 0445 02/29/16 0410 03/01/16 0500 03/02/16 0515  03/03/16 0445  INR 1.38 1.24 1.30 1.31 1.15   Cardiac Enzymes: No results for input(s): CKTOTAL, CKMB, CKMBINDEX, TROPONINI in the last 168 hours. BNP (last 3 results) No results for input(s): PROBNP in the last 8760 hours.  CBG:  Recent Labs Lab 03/02/16 2349 03/03/16 0411 03/03/16 0749 03/03/16 1126 03/03/16 1702  GLUCAP 121* 114* 131* 124* 123*    Studies: No results found.   Scheduled Meds: . antiseptic oral rinse  7 mL Mouth Rinse BID  . enoxaparin (LOVENOX) injection  40 mg Subcutaneous Q24H  . erythromycin  250 mg Intravenous Q6H  . famotidine (PEPCID) IV  20 mg Intravenous Q12H  . insulin aspart  0-9 Units Subcutaneous Q4H  . levothyroxine  25 mcg Intravenous Daily  . lidocaine  1 patch Transdermal Q24H  . metoprolol  10 mg Intravenous Q6H  . pantoprazole  40 mg Oral BID  . pregabalin  75 mg Oral BID   Continuous Infusions: . sodium chloride 10 mL/hr at 02/20/16 2216  . Marland KitchenTPN (CLINIMIX-E) Adult 70 mL/hr at 03/02/16 1818   And  . fat emulsion 240 mL (03/02/16 1818)  . Marland KitchenTPN (CLINIMIX-E) Adult     And  . fat emulsion    . feeding supplement (VITAL 1.5 CAL)     PRN Meds: acetaminophen **OR** acetaminophen, alum & mag hydroxide-simeth, diltiazem, gi cocktail, LORazepam, methocarbamol (ROBAXIN)  IV, ondansetron **OR** ondansetron (ZOFRAN) IV, promethazine, sodium chloride flush  Time spent: 30 minutes  Author: Berle Mull, MD [facility] Pager: [phone] 03/03/2016 5:30 PM  If 7PM-7AM, please contact night-coverage at [email or web address], password [email or web address]

## 2016-03-03 NOTE — Progress Notes (Signed)
Daily Rounding Note  03/03/2016, 10:18 AM  LOS: 13 days   SUBJECTIVE:       No n/v today.  It started to improve last evening.  She feels a lot better.  TF going at 50ml/hour.  Stools are the chronic, usual: loose.   This has been the norm for years since pelvic irradiation for uterine cancer in 1980s.  Still has some pain/soreness in lower abdomen from the hematoma  OBJECTIVE:         Vital signs in last 24 hours:    Temp:  [98.3 F (36.8 C)-98.6 F (37 C)] 98.3 F (36.8 C) (06/28 0441) Pulse Rate:  [92-98] 92 (06/28 0441) Resp:  [18] 18 (06/28 0441) BP: (121-139)/(54-55) 139/55 mmHg (06/28 0441) SpO2:  [99 %] 99 % (06/28 0441) Weight:  [61.825 kg (136 lb 4.8 oz)] 61.825 kg (136 lb 4.8 oz) (06/28 0441) Last BM Date: 03/03/16 Filed Weights   03/01/16 0423 03/02/16 0526 03/03/16 0441  Weight: 64.592 kg (142 lb 6.4 oz) 63.277 kg (139 lb 8 oz) 61.825 kg (136 lb 4.8 oz)   General: pleasant.  Looks happier and better, less tired Heart: RRR Chest: clear bil.  No cough or SOB Abdomen: soft, ND.  BS hypoactive.  Some moderate lower abd tenderness.  No visible hematoma.   Extremities: no CCE Neuro/Psych:  Alert, oriented x 3.  Affect brighter.  No gross deficits.   Intake/Output from previous day: 06/27 0701 - 06/28 0700 In: 4025.7 [P.O.:15; I.V.:241.5; NG/GT:20; IV Piggyback:155; [id number] Out: 1576 [Urine:1575; Stool:1]  Intake/Output this shift: Total I/O In: 1257.2 [I.V.:132.3; NG/GT:66.2; TPN:1058.7] Out: 276 [Urine:275; Stool:1]  Lab Results:  Recent Labs  03/01/16 0500 03/02/16 0515 03/03/16 0445  WBC 6.6 6.3 6.7  HGB 8.0* 8.3* 9.0*  HCT 25.6* 27.1* 28.3*  PLT 230 250 249   BMET  Recent Labs  03/01/16 0500 03/02/16 0515 03/03/16 0445  NA 136 136 134*  K 4.0 3.9 4.2  CL 107 109 105  CO2 23 22 21*  GLUCOSE 116* 114* 295*  BUN 16 17 17   CREATININE 0.73 0.70 0.77  CALCIUM 8.5* 8.5* 8.6*    LFT  Recent Labs  03/01/16 0500  PROT 5.2*  ALBUMIN 2.1*  AST 44*  ALT 44  ALKPHOS 117  BILITOT 0.6   PT/INR  Recent Labs  03/02/16 0515 03/03/16 0445  LABPROT 16.4* 14.9  INR 1.31 1.15   Hepatitis Panel No results for input(s): HEPBSAG, HCVAB, HEPAIGM, HEPBIGM in the last 72 hours.  Studies/Results: Ct Abdomen Pelvis W Contrast  03/01/2016  CLINICAL DATA:  Postop from subtotal gastrectomy and J-tube placement for gastric carcinoma. Persistent nausea and vomiting. Previous left nephrectomy for renal cell carcinoma. EXAM: CT ABDOMEN AND PELVIS WITH CONTRAST TECHNIQUE: Multidetector CT imaging of the abdomen and pelvis was performed using the standard protocol following bolus administration of intravenous contrast. CONTRAST:  175mL ISOVUE-300 IOPAMIDOL (ISOVUE-300) INJECTION 61% COMPARISON:  02/17/2016 FINDINGS: Lower chest: Tiny right pleural effusion and mild bibasilar atelectasis are increased since prior study. Hepatobiliary: No masses or other significant abnormality. Prior cholecystectomy noted. No evidence of biliary dilatation. Pancreas: No mass, inflammatory changes, or other significant abnormality. Spleen: Within normal limits in size and appearance. Adrenals/Urinary Tract: Adrenal glands are normal in appearance. Right kidney is also normal in appearance. Previous left nephrectomy noted. Unopacified urinary bladder is unremarkable in appearance. Stomach/Bowel: Postop changes from subtotal gastrectomy appears stable. A percutaneous jejunostomy tube is now seen  in place. No evidence of bowel obstruction, inflammatory process, or abnormal fluid collections. Vascular/Lymphatic: No pathologically enlarged lymph nodes. No evidence of abdominal aortic aneurysm. Aortic atherosclerosis noted. Reproductive: Prior hysterectomy noted. Adnexal regions are unremarkable in appearance. Other: New hematoma seen within the anterior abdominal wall subcutaneous tissues at site of surgical  incision which measures 3 x 6 cm. Several tiny foci of intraperitoneal air noted just deep to the anterior abdominal wall subcutaneous tissues, consistent with minimal residual postop intraperitoneal air. Musculoskeletal: Beam hardening artifact through the inferior pelvis from right hip prosthesis. No suspicious bone lesions identified. IMPRESSION: New hematoma seen anterior abdominal wall subcutaneous tissues at surgical incision site measuring 3 x 6 cm. No evidence of intraperitoneal hematoma or abscess Increased tiny left pleural effusion and bibasilar atelectasis. Aortic atherosclerosis noted. Electronically Signed   By: Earle Gell M.D.   On: 03/01/2016 15:32    ASSESMENT:   * N/V in complicated pt s/p [id number] subtotal gastrectomy for gastric adenoncarcinoma. Severe esophagitis on 6/1 EGD, 6/20 laparotomy/LOA/J tube placement. 6/26 abdominal wall hematoma at area of incision is unlikely to be contributing to long-standing n/v.  Scheduled q 6 IV Erythromycin added 6/27.  Ongoing IV Pepcid, TPN.  42ml/hour Vital 1.5 tube feeding formula restarted afternoon 6/27.  So far is doing better.       * Post op anemia in [employer]. S/p Aranesp and Feraheme. Hgb improving.    *  Protein cal malnutrition.  On TPN, on low rate TF.     PLAN   *  Advance rate of Jejunal TF per Dr Barry Dienes.  Leave IV Erythromycin in place.     Azucena Freed  03/03/2016, 10:18 AM Pager: [phone]

## 2016-03-03 NOTE — Progress Notes (Signed)
Nutrition Follow-up  DOCUMENTATION CODES:   Non-severe (moderate) malnutrition in context of chronic illness  INTERVENTION:  TPN management per pharmacy Recommend increasing Vital 1.5 to 10 ml/hr via J-tube and increasing by 5 ml/hr every 2-4 hours as tolerated to goal rate of 45 ml/hr.  Recommend weaning TPN once pt is able to tolerate TF at >/=20 ml/hr >24 hours   NUTRITION DIAGNOSIS:   Malnutrition related to chronic illness, altered GI function as evidenced by mild depletion of body fat, mild depletion of muscle mass, percent weight loss.  Ongoing  GOAL:   Patient will meet greater than or equal to 90% of their needs  Being Met  MONITOR:   Diet advancement, PO intake, Skin, I & O's, Labs, Weight trends  REASON FOR ASSESSMENT:   Consult Enteral/tube feeding initiation and management  ASSESSMENT:   82 y.o. female with a Past Medical History of PAF CAD, tachy/brady syndrome, RLS, GERD, uterine cancer and renal cancer, HTN, HLD, hypothyroidism, gastroparesis, Adenocarcinoma of the stomach s/p partial gastrectomy, ckd, and cva, who presents with nausea vomiting abdominal pain. This seems to be a recurrent issue for patient ever since she underwent partial gastrectomy.   Pt continues to receive TPN at goal rate which is meeting 100% of estimated energy and protein needs. Tube feedings were re-started yesterday afternoon with Vital 1.5 @ 5 ml/hr. Pt reports feeling much better today with no nausea since yesterday afternoon. Per family, plan is to increase tube feeding rate to 10 ml/hr this afternoon. Her weight has trended down 6 lbs in the past 2 days and is currently 6 lbs above admission weight.   Labs: glucose ranging 108 to 295 mg/dL, low calcium, low hemoglobin  Diet Order:  Diet NPO time specified TPN (CLINIMIX-E) Adult TPN (CLINIMIX-E) Adult  Skin:  Reviewed, no issues  Last BM:  6/28  Height:   Ht Readings from Last 1 Encounters:  02/17/16 '5\' 1"'$  (1.549 m)     Weight:   Wt Readings from Last 1 Encounters:  03/03/16 136 lb 4.8 oz (61.825 kg)    Ideal Body Weight:  47.7 kg  BMI:  Body mass index is 25.77 kg/(m^2).  Estimated Nutritional Needs:   Kcal:  1500-1700  Protein:  75-90 grams  Fluid:  1.5-1.7 L/day  EDUCATION NEEDS:   No education needs identified at this time  [place], LDN Inpatient Clinical Dietitian Pager: [phone] After Hours Pager: [phone]

## 2016-03-03 NOTE — Care Management Important Message (Signed)
Important Message  Patient Details  Name: [name] MRN: [id number] Date of Birth: 02-May-1934   Medicare Important Message Given:  Yes    Loann Quill 03/03/2016, 10:02 AM

## 2016-03-03 NOTE — Progress Notes (Signed)
Physical Therapy Treatment Patient Details Name: [name] MRN: [id number] DOB: 20-Apr-1934 Today's Date: 03/03/2016    History of Present Illness [name] is a 82 y.o. female with medical history significant for but not limited to, atrial fibrillation, CKD, HTN, kidney cancer and gastric cancer. She is status post proximal gastrectomy early May for gastric adenocarcinoma. d/c home and readmitted with gastritis and received TNA. Went home and readmit 6/9 with N/V. 6/20 jejunostomy    PT Comments    Pt feeling much better today and able to tolerate ambulation and there ex. Ambulated 3' with RW and min A, increasing fatigue after 20'. PT will continue to follow.   Follow Up Recommendations  Home health PT;Supervision/Assistance - 24 hour     Equipment Recommendations  None recommended by PT    Recommendations for Other Services       Precautions / Restrictions Precautions Precautions: Fall Restrictions Weight Bearing Restrictions: No    Mobility  Bed Mobility               General bed mobility comments: pt received in chair  Transfers Overall transfer level: Needs assistance Equipment used: Rolling walker (2 wheeled) Transfers: Sit to/from Stand Sit to Stand: Min guard         General transfer comment: min-guard from recliner 2x, good push with UE's, lacking right knee flexion so reliant on LLE  Ambulation/Gait Ambulation/Gait assistance: Min assist Ambulation Distance (Feet): 40 Feet Assistive device: Rolling walker (2 wheeled) Gait Pattern/deviations: Step-through pattern;Shuffle;Trunk flexed Gait velocity: decreased Gait velocity interpretation: Below normal speed for age/gender General Gait Details: pt fatigued quickly, decreased pace second 20' as well as trunk flexion and reliant on RW   Stairs            Wheelchair Mobility    Modified Rankin (Stroke Patients Only)       Balance Overall balance assessment: Needs  assistance Sitting-balance support: No upper extremity supported;Feet supported Sitting balance-Leahy Scale: Good     Standing balance support: Bilateral upper extremity supported Standing balance-Leahy Scale: Poor Standing balance comment: walker needed for UE support                    Cognition Arousal/Alertness: Awake/alert Behavior During Therapy: WFL for tasks assessed/performed Overall Cognitive Status: Within Functional Limits for tasks assessed                      Exercises General Exercises - Lower Extremity Long Arc Quad: AROM;Both;10 reps;Seated Toe Raises: AROM;Both;10 reps;Standing Heel Raises: AROM;Both;10 reps;Standing Mini-Sqauts: AROM;5 reps;Standing    General Comments        Pertinent Vitals/Pain Pain Assessment: Faces Faces Pain Scale: Hurts a little bit Pain Location: abdomen Pain Descriptors / Indicators: Sore Pain Intervention(s): Monitored during session    Home Living                      Prior Function            PT Goals (current goals can now be found in the care plan section) Acute Rehab PT Goals Patient Stated Goal: go home PT Goal Formulation: With patient/family Time For Goal Achievement: 03/08/16 Potential to Achieve Goals: Good Progress towards PT goals: Progressing toward goals    Frequency  Min 3X/week    PT Plan Current plan remains appropriate    Co-evaluation             End of Session  Activity Tolerance: Patient limited by fatigue Patient left: in chair;with call bell/phone within reach;with family/visitor present     Time: 0900-0916 PT Time Calculation (min) (ACUTE ONLY): 16 min  Charges:  $Gait Training: 8-22 mins                    G Codes:     Leighton Roach, PT  Acute Rehab Services  [phone]  Leighton Roach 03/03/2016, 9:46 AM

## 2016-03-03 NOTE — Progress Notes (Signed)
Patient ID: [name], female   DOB: 02-19-1934, 82 y.o.   MRN: [id number] 8 Days Post-Op  Subjective: Doing much better.  Tolerated feeds.    Objective: Vital signs in last 24 hours: Temp:  [98.3 F (36.8 C)-98.6 F (37 C)] 98.3 F (36.8 C) (06/28 0441) Pulse Rate:  [92-98] 92 (06/28 0441) Resp:  [18] 18 (06/28 0441) BP: (121-139)/(54-55) 139/55 mmHg (06/28 0441) SpO2:  [99 %] 99 % (06/28 0441) Weight:  [61.825 kg (136 lb 4.8 oz)] 61.825 kg (136 lb 4.8 oz) (06/28 0441) Last BM Date: 03/02/16  Intake/Output from previous day: 06/27 0701 - 06/28 0700 In: 4025.7 [P.O.:15; I.V.:241.5; NG/GT:20; IV Piggyback:155; [id number] Out: 1576 [Urine:1575; Stool:1] Intake/Output this shift: Total I/O In: 1257.2 [I.V.:132.3; NG/GT:66.2; TPN:1058.7] Out: 276 [Urine:275; Stool:1]  Resp: breathing comfortably Cardio: regular rate and rhythm GI: soft, hematoma at incision looks stable.  No erythema.  Lab Results:   Recent Labs  03/02/16 0515 03/03/16 0445  WBC 6.3 6.7  HGB 8.3* 9.0*  HCT 27.1* 28.3*  PLT 250 249   BMET  Recent Labs  03/02/16 0515 03/03/16 0445  NA 136 134*  K 3.9 4.2  CL 109 105  CO2 22 21*  GLUCOSE 114* 295*  BUN 17 17  CREATININE 0.70 0.77  CALCIUM 8.5* 8.6*   PT/INR  Recent Labs  03/02/16 0515 03/03/16 0445  LABPROT 16.4* 14.9  INR 1.31 1.15   ABG No results for input(s): PHART, HCO3 in the last 72 hours.  Invalid input(s): PCO2, PO2  Studies/Results: Ct Abdomen Pelvis W Contrast  03/01/2016  CLINICAL DATA:  Postop from subtotal gastrectomy and J-tube placement for gastric carcinoma. Persistent nausea and vomiting. Previous left nephrectomy for renal cell carcinoma. EXAM: CT ABDOMEN AND PELVIS WITH CONTRAST TECHNIQUE: Multidetector CT imaging of the abdomen and pelvis was performed using the standard protocol following bolus administration of intravenous contrast. CONTRAST:  197mL ISOVUE-300 IOPAMIDOL (ISOVUE-300) INJECTION 61%  COMPARISON:  02/17/2016 FINDINGS: Lower chest: Tiny right pleural effusion and mild bibasilar atelectasis are increased since prior study. Hepatobiliary: No masses or other significant abnormality. Prior cholecystectomy noted. No evidence of biliary dilatation. Pancreas: No mass, inflammatory changes, or other significant abnormality. Spleen: Within normal limits in size and appearance. Adrenals/Urinary Tract: Adrenal glands are normal in appearance. Right kidney is also normal in appearance. Previous left nephrectomy noted. Unopacified urinary bladder is unremarkable in appearance. Stomach/Bowel: Postop changes from subtotal gastrectomy appears stable. A percutaneous jejunostomy tube is now seen in place. No evidence of bowel obstruction, inflammatory process, or abnormal fluid collections. Vascular/Lymphatic: No pathologically enlarged lymph nodes. No evidence of abdominal aortic aneurysm. Aortic atherosclerosis noted. Reproductive: Prior hysterectomy noted. Adnexal regions are unremarkable in appearance. Other: New hematoma seen within the anterior abdominal wall subcutaneous tissues at site of surgical incision which measures 3 x 6 cm. Several tiny foci of intraperitoneal air noted just deep to the anterior abdominal wall subcutaneous tissues, consistent with minimal residual postop intraperitoneal air. Musculoskeletal: Beam hardening artifact through the inferior pelvis from right hip prosthesis. No suspicious bone lesions identified. IMPRESSION: New hematoma seen anterior abdominal wall subcutaneous tissues at surgical incision site measuring 3 x 6 cm. No evidence of intraperitoneal hematoma or abscess Increased tiny left pleural effusion and bibasilar atelectasis. Aortic atherosclerosis noted. Electronically Signed   By: Earle Gell M.D.   On: 03/01/2016 15:32    Anti-infectives: Anti-infectives    Start     Dose/Rate Route Frequency Ordered Stop   03/02/16 1530  erythromycin  250 mg in sodium chloride  0.9 % 100 mL IVPB     250 mg 100 mL/hr over 60 Minutes Intravenous Every 6 hours 03/02/16 1424     02/29/16 1000  fluconazole (DIFLUCAN) IVPB 200 mg     200 mg 100 mL/hr over 60 Minutes Intravenous Every 24 hours 02/29/16 0847 03/02/16 1111   02/24/16 1000  ceFAZolin (ANCEF) IVPB 2g/100 mL premix    Comments:  On call to OR.   2 g 200 mL/hr over 30 Minutes Intravenous To [facility] 02/24/16 0848 02/24/16 1215   02/18/16 1000  fluconazole (DIFLUCAN) IVPB 200 mg  Status:  Discontinued     200 mg 100 mL/hr over 60 Minutes Intravenous Every 24 hours 02/18/16 0917 02/29/16 0847   02/17/16 2200  erythromycin 250 mg in sodium chloride 0.9 % 100 mL IVPB  Status:  Discontinued     250 mg 100 mL/hr over 60 Minutes Intravenous Every 12 hours 02/17/16 1720 02/17/16 1853   02/17/16 2100  erythromycin 250 mg in sodium chloride 0.9 % 100 mL IVPB  Status:  Discontinued     250 mg 100 mL/hr over 60 Minutes Intravenous Every 12 hours 02/17/16 1853 02/20/16 0737      Assessment/Plan: s/p Procedure(s): OPEN J TUBE (Left)  Heating pad.  slow tube feed advance.   Unclear why retching stopped, but probably combination of protonix and e-mycin.    Holding heparin currently due to hematoma at incision.   LOS: 13 days    [name] 03/03/2016

## 2016-03-03 NOTE — Progress Notes (Signed)
PARENTERAL NUTRITION CONSULT NOTE - FOLLOW UP  Pharmacy Consult for TPN Indication: intolerance to enteral feeds  Allergies  Allergen Reactions  . Amiodarone Other (See Comments)     Thyroid and liver and kidney problems  . Codeine Nausea And Vomiting  . Gabapentin Itching  . Hydrocodone Other (See Comments)    hallucination  . Morphine And Related Nausea And Vomiting  . Requip [Ropinirole Hcl]     Headache   . Zinc Nausea Only    nausea  . Reglan [Metoclopramide] Hives, Itching and Rash    Made face red, also    Patient Measurements: Height: 5\' 1"  (X33443 cm) Weight: 136 lb 4.8 oz (61.825 kg) (Scale A) IBW/kg (Calculated) : 47.8   Vital Signs: Temp: 98.3 F (36.8 C) (06/28 0441) Temp Source: Oral (06/28 0441) BP: 139/55 mmHg (06/28 0441) Pulse Rate: 92 (06/28 0441) Intake/Output from previous day: 06/27 0701 - 06/28 0700 In: 4025.7 [P.O.:15; I.V.:241.5; NG/GT:20; IV Piggyback:155; [id number] Out: 1576 [Urine:1575; Stool:1] Intake/Output from this shift:    Labs:  Recent Labs  03/01/16 0500 03/02/16 0515 03/03/16 0445  WBC 6.6 6.3 6.7  HGB 8.0* 8.3* 9.0*  HCT 25.6* 27.1* 28.3*  PLT 230 250 249  INR 1.30 1.31 1.15     Recent Labs  03/01/16 0500 03/02/16 0515 03/03/16 0445  NA 136 136 134*  K 4.0 3.9 4.2  CL 107 109 105  CO2 23 22 21*  GLUCOSE 116* 114* 295*  BUN 16 17 17   CREATININE 0.73 0.70 0.77  CALCIUM 8.5* 8.5* 8.6*  MG 1.7 1.9 1.8  PHOS 4.5  --   --   PROT 5.2*  --   --   ALBUMIN 2.1*  --   --   AST 44*  --   --   ALT 44  --   --   ALKPHOS 117  --   --   BILITOT 0.6  --   --   PREALBUMIN 13.0*  --   --   TRIG 93  --   --    Estimated Creatinine Clearance: 47.3 mL/min (by C-G formula based on Cr of 0.77).    Recent Labs  03/02/16 2002 03/02/16 2349 03/03/16 0411  GLUCAP 116* 121* 114*    Medications:  Scheduled:  . antiseptic oral rinse  7 mL Mouth Rinse BID  . erythromycin  250 mg Intravenous Q6H  . famotidine  (PEPCID) IV  20 mg Intravenous Q12H  . insulin aspart  0-9 Units Subcutaneous Q4H  . levothyroxine  25 mcg Intravenous Daily  . lidocaine  1 patch Transdermal Q24H  . metoprolol  10 mg Intravenous Q6H  . pantoprazole  40 mg Oral BID  . pregabalin  75 mg Oral BID    Insulin Requirements in the past 24 hours:  4 units on SSI sensitive q4h  Admit: 16 YOF with history of kidney and gastric cancer, s/p proximal gastrectomy in early May. She was readmitted earlier in June with unretractable N/V and was on TPN at that time. This was discontinued prior to her going home- confirmed with Wynona Luna from [place].   Surgeries/Procedures: 6/20: Jtube placed  GI: GERD / anal fissure / gastric cancer. EGD during last admission showed esophagitis, gastric ulcer at gastric anastomosis, was to be on liquids only for 2 weeks at home. S/P Emycin for gastroparesis (6/13 >> 6/16).  JT placed & have attempted resuming TF x 2, but stopped each time after 6hr d/t N/V (6/21 &  6/24).  Prealbumin 19.7>12.9>13.  Changed to NPO 6/25. Mostly IV meds, passing flatus, had BM 6/24. Pt with abdominal pain/vomiting noted on 6/26 - CT showed a new anterior abdominal wall hematoma. Per GI, to attempt restart of J-tube feeds since pt noted to have N/V with and without them - to slowly advance as tolerated. Pepcid taken out of TPN and given separately via IVPB per request of the family. Erythromycin restarted, continues on prn zofran/phenergan  Endo: Hypothyroid on Synthroid- back to IV 6/25. No hx DM. CBGs 114-143 on SSI  Lytes: Na 134, K 4.2, Mag 1.8, Phos 4.5 (6/26), CoCa ~9.9 - will give Mg replacement today and f/u TPN labs in the AM  Renal: SCr 0.77 stable, CrCl ~40-88mL/min, UOP 1.1 ml/kg/hr  Pulm: 99%/RA  Cards: PAF / CAD / AS / tachy-brady syndrome post PPM / HTN / HLD / CHF. BP soft-wnl, HR wnl-tachy. On lopressor IV  Hepatobil: LFTs wnl, albumin 2.1, TG 93 (6/26)  Neuro: neuropathy / RLS / migraine /  anxiety / hx CVA. - Pregabalin, lidocaine TD, robaxin prn  ID: Pressure ulcer identified - esophagitis - s/p Fluconazole thru 6/27.  Afeb, WBC wnl  AC/heme: Warfarin PTA for hx Afib - transitioned to Hep IV while NPO with TPN - CT on 6/26 showed new anterior abdominal wall hematoma - AC now held momentarily, f/u restart plans.Hgb 9, plts wnl  Best Practices: IV heparin/warfarin, MC  TPN Access: PICC placed 6/14 TPN start date: 6/14>>  Current Nutrition:  Clinimix E 5/15 at 51mL/hr + IV lipids at 69mL/hr - this provides 84g protein and 1672kcal which meets 100% of needs Vital 1.5 @ 5 cc/hr will provide 180 kcal and 8g protein per day  Nutritional Goals: (per RD recommendations 6/26) 1500-1700 kCal, 75-90 grams of protein per day  Plan:  - Continue Clinimix E 5/15 at 46mL/hr + IV lipids at 58mL/hr - MVI and trace elements in TPN - Continue sensitive SSI and CBGs q4h - Mg 2g IV x 1 dose today - F/u TPN labs in the AM  Alycia Rossetti, PharmD, BCPS Clinical Pharmacist Pager: [phone] 03/03/2016 7:41 AM

## 2016-03-03 NOTE — Progress Notes (Signed)
CM following for DCP; Home with [facility] when medically stable; Aneta Mins [phone]

## 2016-03-04 LAB — CBC
HEMATOCRIT: 28.9 % — AB (ref 36.0–46.0)
Hemoglobin: 9 g/dL — ABNORMAL LOW (ref 12.0–15.0)
MCH: 31.3 pg (ref 26.0–34.0)
MCHC: 31.1 g/dL (ref 30.0–36.0)
MCV: 100.3 fL — ABNORMAL HIGH (ref 78.0–100.0)
PLATELETS: 251 10*3/uL (ref 150–400)
RBC: 2.88 MIL/uL — AB (ref 3.87–5.11)
RDW: 19 % — AB (ref 11.5–15.5)
WBC: 6.9 10*3/uL (ref 4.0–10.5)

## 2016-03-04 LAB — COMPREHENSIVE METABOLIC PANEL
ALK PHOS: 107 U/L (ref 38–126)
ALT: 35 U/L (ref 14–54)
ANION GAP: 8 (ref 5–15)
AST: 33 U/L (ref 15–41)
Albumin: 2.3 g/dL — ABNORMAL LOW (ref 3.5–5.0)
BILIRUBIN TOTAL: 0.7 mg/dL (ref 0.3–1.2)
BUN: 19 mg/dL (ref 6–20)
CALCIUM: 8.5 mg/dL — AB (ref 8.9–10.3)
CO2: 20 mmol/L — ABNORMAL LOW (ref 22–32)
Chloride: 109 mmol/L (ref 101–111)
Creatinine, Ser: 0.71 mg/dL (ref 0.44–1.00)
Glucose, Bld: 96 mg/dL (ref 65–99)
POTASSIUM: 3.8 mmol/L (ref 3.5–5.1)
Sodium: 137 mmol/L (ref 135–145)
TOTAL PROTEIN: 5.5 g/dL — AB (ref 6.5–8.1)

## 2016-03-04 LAB — GLUCOSE, CAPILLARY
GLUCOSE-CAPILLARY: 130 mg/dL — AB (ref 65–99)
GLUCOSE-CAPILLARY: 104 mg/dL — AB (ref 65–99)
GLUCOSE-CAPILLARY: 113 mg/dL — AB (ref 65–99)
Glucose-Capillary: 116 mg/dL — ABNORMAL HIGH (ref 65–99)
Glucose-Capillary: 121 mg/dL — ABNORMAL HIGH (ref 65–99)
Glucose-Capillary: 126 mg/dL — ABNORMAL HIGH (ref 65–99)

## 2016-03-04 LAB — MAGNESIUM: MAGNESIUM: 1.8 mg/dL (ref 1.7–2.4)

## 2016-03-04 LAB — PROTIME-INR
INR: 1.23 (ref 0.00–1.49)
PROTHROMBIN TIME: 15.7 s — AB (ref 11.6–15.2)

## 2016-03-04 LAB — PHOSPHORUS: Phosphorus: 4.7 mg/dL — ABNORMAL HIGH (ref 2.5–4.6)

## 2016-03-04 MED ORDER — SODIUM CHLORIDE 0.9 % IV SOLN
250.0000 mg | Freq: Three times a day (TID) | INTRAVENOUS | Status: DC
  Administered 2016-03-04 – 2016-03-05 (×3): 250 mg via INTRAVENOUS
  Filled 2016-03-04 (×5): qty 5

## 2016-03-04 MED ORDER — VITAL 1.5 CAL PO LIQD
1000.0000 mL | ORAL | Status: DC
  Administered 2016-03-04: 1000 mL
  Filled 2016-03-04 (×2): qty 1000

## 2016-03-04 MED ORDER — MAGNESIUM SULFATE 50 % IJ SOLN
3.0000 g | Freq: Once | INTRAMUSCULAR | Status: AC
  Administered 2016-03-04: 3 g via INTRAVENOUS
  Filled 2016-03-04: qty 6

## 2016-03-04 MED ORDER — NICOTINE 7 MG/24HR TD PT24: 7.0000 mg | MEDICATED_PATCH | Freq: Every day | TRANSDERMAL | Status: DC

## 2016-03-04 MED ORDER — CALCIUM CARBONATE ANTACID 500 MG PO CHEW
1.0000 | CHEWABLE_TABLET | Freq: Three times a day (TID) | ORAL | Status: DC | PRN
  Administered 2016-03-04: 200 mg via ORAL
  Filled 2016-03-04: qty 1

## 2016-03-04 MED ORDER — LIDOCAINE 5 % EX PTCH: 1.0000 | MEDICATED_PATCH | CUTANEOUS | Status: DC

## 2016-03-04 MED ORDER — TRACE MINERALS CR-CU-MN-SE-ZN 10-1000-500-60 MCG/ML IV SOLN
INTRAVENOUS | Status: AC
  Administered 2016-03-04: 17:00:00 via INTRAVENOUS
  Filled 2016-03-04: qty 1200

## 2016-03-04 MED ORDER — LIDOCAINE 5 % EX PTCH
1.0000 | MEDICATED_PATCH | CUTANEOUS | Status: DC
  Administered 2016-03-04 – 2016-03-08 (×5): 1 via TRANSDERMAL
  Filled 2016-03-04 (×5): qty 1

## 2016-03-04 MED ORDER — SODIUM CHLORIDE 0.9 % IV SOLN
250.0000 mg | Freq: Two times a day (BID) | INTRAVENOUS | Status: DC
  Filled 2016-03-04: qty 5

## 2016-03-04 MED ORDER — FAT EMULSION 20 % IV EMUL
192.0000 mL | INTRAVENOUS | Status: AC
  Administered 2016-03-04: 192 mL via INTRAVENOUS
  Filled 2016-03-04: qty 250

## 2016-03-04 NOTE — Progress Notes (Signed)
PARENTERAL NUTRITION CONSULT NOTE - FOLLOW UP  Pharmacy Consult for TPN Indication: intolerance to enteral feeds  Allergies  Allergen Reactions  . Amiodarone Other (See Comments)     Thyroid and liver and kidney problems  . Codeine Nausea And Vomiting  . Gabapentin Itching  . Hydrocodone Other (See Comments)    hallucination  . Morphine And Related Nausea And Vomiting  . Requip [Ropinirole Hcl]     Headache   . Zinc Nausea Only    nausea  . Reglan [Metoclopramide] Hives, Itching and Rash    Made face red, also    Patient Measurements: Height: 5\' 1"  (154.9 cm) Weight: 140 lb 4.8 oz (63.64 kg) (Scale A) IBW/kg (Calculated) : 47.8   Vital Signs: Temp: 98.1 F (36.7 C) (06/29 0451) Temp Source: Oral (06/29 0451) BP: 116/51 mmHg (06/29 0451) Pulse Rate: 102 (06/29 0451) Intake/Output from previous day: 06/28 0701 - 06/29 0700 In: 2228.2 [I.V.:132.3; NG/GT:237; TPN:1858.8] Out: 1529 [Urine:1525; Stool:4] Intake/Output from this shift: Total I/O In: -  Out: 300 [Urine:300]  Labs:  Recent Labs  03/02/16 0515 03/03/16 0445 03/04/16 0505  WBC 6.3 6.7 6.9  HGB 8.3* 9.0* 9.0*  HCT 27.1* 28.3* 28.9*  PLT 250 249 251  INR 1.31 1.15 1.23     Recent Labs  03/02/16 0515 03/03/16 0445 03/04/16 0505  NA 136 134* 137  K 3.9 4.2 3.8  CL 109 105 109  CO2 22 21* 20*  GLUCOSE 114* 295* 96  BUN 17 17 19   CREATININE 0.70 0.77 0.71  CALCIUM 8.5* 8.6* 8.5*  MG 1.9 1.8 1.8  PHOS  --   --  4.7*  PROT  --   --  5.5*  ALBUMIN  --   --  2.3*  AST  --   --  33  ALT  --   --  35  ALKPHOS  --   --  107  BILITOT  --   --  0.7   Estimated Creatinine Clearance: 47.9 mL/min (by C-G formula based on Cr of 0.71).    Recent Labs  03/03/16 2025 03/04/16 0009 03/04/16 0410  GLUCAP 88 126* 121*    Medications:  Scheduled:  . antiseptic oral rinse  7 mL Mouth Rinse BID  . enoxaparin (LOVENOX) injection  40 mg Subcutaneous Q24H  . erythromycin  250 mg Intravenous Q6H   . famotidine (PEPCID) IV  20 mg Intravenous Q12H  . insulin aspart  0-9 Units Subcutaneous Q4H  . levothyroxine  25 mcg Intravenous Daily  . lidocaine  1 patch Transdermal Q24H  . metoprolol  10 mg Intravenous Q6H  . pantoprazole  40 mg Oral BID  . pregabalin  75 mg Oral BID    Insulin Requirements in the past 24 hours:  3 units on SSI sensitive q4h  Admit: 80 YOF with history of kidney and gastric cancer, s/p proximal gastrectomy in early May. She was readmitted earlier in June with unretractable N/V and was on TPN at that time. This was discontinued prior to her going home- confirmed with Wynona Luna from [place].   Surgeries/Procedures: 6/20: Jtube placed  GI: GERD / anal fissure / gastric cancer. EGD during last admission showed esophagitis, gastric ulcer at gastric anastomosis, was to be on liquids only for 2 weeks at home. S/P Emycin for gastroparesis (6/13 >> 6/16).  JT placed & have attempted resuming TF x 2, but stopped each time after 6hr d/t N/V (6/21 & 6/24).  Prealbumin 19.7>12.9>13.  Changed to NPO 6/25. Mostly IV meds, passing flatus, had BM 6/24. Pt with abdominal pain/vomiting noted on 6/26 - CT showed a new anterior abdominal wall hematoma. Per GI, to attempt restart of J-tube feeds since pt noted to have N/V with and without them - to slowly advance as tolerated. Tolerating with reduced N/V - rate increased to 10 cc/hr. LBM 6/28 Pepcid taken out of TPN and given separately via IVPB per request of the family. Erythromycin decreased, continues on prn zofran/phenergan  Endo: Hypothyroid on Synthroid- back to IV 6/25. No hx DM. CBGs 88-126 on SSI  Lytes: Na 137, K 3.8, Mag 1.8, Phos 4.7 (6/28), CoCa ~9.7 - will give Mg replacement today and f/u TPN labs in the AM  Renal: SCr 0.71 stable, CrCl ~40-52mL/min, UOP 1 ml/kg/hr  Pulm: 98%/RA  Cards: PAF / CAD / AS / tachy-brady syndrome post PPM / HTN / HLD / CHF. BP soft-wnl, HR wnl-tachy. On lopressor  IV  Hepatobil: LFTs wnl, albumin 2.1, TG 93 (6/26)  Neuro: neuropathy / RLS / migraine / anxiety / hx CVA. - Pregabalin, lidocaine TD, robaxin prn  ID: Pressure ulcer identified - esophagitis - s/p Fluconazole thru 6/27.  Afeb, WBC wnl  AC/heme: Warfarin PTA for hx Afib - transitioned to Hep IV while NPO with TPN - CT on 6/26 showed new anterior abdominal wall hematoma - AC now held momentarily, f/u restart plans.Hgb 9, plts wnl. Enox 40 started for VTE px  Best Practices: IV heparin/warfarin, MC  TPN Access: PICC placed 6/14 TPN start date: 6/14>>  Current Nutrition:  Clinimix E 5/15 at 12mL/hr + IV lipids at 30mL/hr - this provides 84g protein and 1672kcal which meets 100% of needs Vital 1.5 @ 10 cc/hr will provide 360 kcal and 16g protein per day  Nutritional Goals: (per RD recommendations 6/28) 1500-1700 kCal, 75-90 grams of protein per day  Plan:  - Reduce Clinimix E 5/15 slight to 50 mL/hr  - Reduce IV lipids to 8 mL/hr - Clinimix + lipids + TFs will provide 1596 kcal and 76g protein per day - meeting 100% of kcal and protein needs - MVI and trace elements in TPN - Continue sensitive SSI and CBGs q4h - Mg 3 g IV x 1 dose today - F/u Mg in the AM - Will f/u with TF advancement/tolerance and eventual weaning and d/c of TPN  Alycia Rossetti, PharmD, BCPS Clinical Pharmacist Pager: [phone] 03/04/2016 7:28 AM

## 2016-03-04 NOTE — Progress Notes (Signed)
[facility] Progress Note  Patient: [name] [id number]   PCP: [name], MD DOB: 10/09/1933   DOA: 02/17/2016   DOS: 03/04/2016   Date of Service: the patient was seen and examined on 03/04/2016  Subjective: One episode of nausea and indigestion in the morning. No abdominal pain. No fever. Nutrition: Currently minimal oral intake due to intolerance  Brief hospital course: Pt. with PMH of A. fib, CKD,HTN, gastric cancer S/P proximal gastrectomy 01/2016; admitted on 02/17/2016, with complaint of vomiting, was found to have suspected esophagitis. Patient was given IV Diflucan, IV PPI and was started on TPN. Also started on IV erythromycin for motility. With initial treatment the patient was able to tolerate by mouth pills but no food intake and therefore it was decided to place a J-tube, procedure done on 02/24/2016. Since the procedure pt has developed ileus which Appears to be improving. She also has anemia which appears to be dilutional and refusing blood transfusion. CT abdomen shows evidence of hematoma with any active bleeding. No other acute abnormality. Currently further plan is monitor for recovery from ileus  Assessment and Plan: 1. Gastroesophageal reflux disease with esophagitis from esophagitis. Gastric adenocarcinoma GERD with esophagitis Recent subtotal gastrectomy for gastric adenocarcinoma May 2017 Surgical site hematoma.  Minimal oral intake by mouth despite improvement in nausea S/P J-tube placement 02/24/2016. Reoccurrence of nausea and vomiting after initiation of the tube feeding. BACK and forth on tube feeding and remaining nothing by mouth. Continue IV Pepcid, continue TPN via PICC line. Nothing by mouth except pills, tolerating tube feeding at a trickle rate via J-tube Diflucan for thrush, for a total 14 days, last day 03/02/2016 Continue Protonix orally. Add tums PRN Continue Erythromycin scheduled, reduce the frequency to TID and later BID  as the tube feed are advanced, until tube feedings are at goal. Appreciate GI and general surgery consultation.  2. Paroxysmal A. fib. Essential hypertension, blood pressure well controlled Pacemaker implant Chadsvasc 5. On warfarin  Currently rate controlled. Continue Cardizem when necessary,  On IV lopressor Was given Lovenox preprocedure, postprocedure on heparin for anticoagulation. At present the patient has development of surgical site hematoma and therefore I will hold off on therapeutic anticoagulation. While the patient is a high risk for developing DVTs due to her lack of mobility I will continue prophylactic Lovenox dose.  3. Hypothyroidism. Changing to IV Synthroid at present We will continue to monitor.  4. Dehydration. Mild renal insufficiency. Improvement with IV hydration. We will continue to monitor.  5. Postoperative blood loss anemia. Surgical site hematoma, stable Patient's hemoglobin dropped from 12-8.0. No evidence of active bleeding. Patient is 4 L positive between these 2 CBC check. This likely appears dilutional. Patient is refusing blood transfusion,  S/P IV Feraheme 5101, [place] Aranesp 1 as well as injection B-12 on 02/27/2016, after discussion with patient's hematology Dr Irene Limbo. CT abdomen showed evidence of surgical site hematoma what appears to be stable at present. Discontinue therapeutic doses of anticoagulation and use only prophylactic dose of anticoagulation.  Pain management: When necessary Robaxin, when necessary tramadol, lidocaine patch Activity: Consulted physical therapy, home health 24-hour supervision recommended Bowel regimen: last BM 03/04/2016 Diet: feeding advanced, continue TPN DVT Prophylaxis: on prophylactic Lovenox.  Advance goals of care discussion: DNR/DNI  Family Communication: family was present at bedside, at the time of interview. The pt provided permission to discuss medical plan with the family. Opportunity was given to  ask question and all questions were answered satisfactorily.   Disposition:  Discharge to home with home health. Expected discharge date: 03/05/2016-03/06/2016, pending improvement feeding tolerance  Consultants: Gen. surgery Procedures: J-tube placement, PICC line placement, TPN  Antibiotics: Anti-infectives    Start     Dose/Rate Route Frequency Ordered Stop   03/04/16 2200  erythromycin 250 mg in sodium chloride 0.9 % 100 mL IVPB  Status:  Discontinued     250 mg 100 mL/hr over 60 Minutes Intravenous Every 12 hours 03/04/16 0803 03/04/16 1306   03/04/16 1400  erythromycin 250 mg in sodium chloride 0.9 % 100 mL IVPB     250 mg 100 mL/hr over 60 Minutes Intravenous Every 8 hours 03/04/16 1306     03/02/16 1530  erythromycin 250 mg in sodium chloride 0.9 % 100 mL IVPB  Status:  Discontinued     250 mg 100 mL/hr over 60 Minutes Intravenous Every 6 hours 03/02/16 1424 03/04/16 0803   02/29/16 1000  fluconazole (DIFLUCAN) IVPB 200 mg     200 mg 100 mL/hr over 60 Minutes Intravenous Every 24 hours 02/29/16 0847 03/02/16 1111   02/24/16 1000  ceFAZolin (ANCEF) IVPB 2g/100 mL premix    Comments:  On call to OR.   2 g 200 mL/hr over 30 Minutes Intravenous To ShortStay Surgical 02/24/16 0848 02/24/16 1215   02/18/16 1000  fluconazole (DIFLUCAN) IVPB 200 mg  Status:  Discontinued     200 mg 100 mL/hr over 60 Minutes Intravenous Every 24 hours 02/18/16 0917 02/29/16 0847   02/17/16 2200  erythromycin 250 mg in sodium chloride 0.9 % 100 mL IVPB  Status:  Discontinued     250 mg 100 mL/hr over 60 Minutes Intravenous Every 12 hours 02/17/16 1720 02/17/16 1853   02/17/16 2100  erythromycin 250 mg in sodium chloride 0.9 % 100 mL IVPB  Status:  Discontinued     250 mg 100 mL/hr over 60 Minutes Intravenous Every 12 hours 02/17/16 1853 02/20/16 0737        Intake/Output Summary (Last 24 hours) at 03/04/16 1309 Last data filed at 03/04/16 0724  Gross per 24 hour  Intake 971.01 ml  Output    1553 ml  Net -581.99 ml   Filed Weights   03/02/16 0526 03/03/16 0441 03/04/16 0451  Weight: 63.277 kg (139 lb 8 oz) 61.825 kg (136 lb 4.8 oz) 63.64 kg (140 lb 4.8 oz)    Objective: Physical Exam: Filed Vitals:   03/03/16 1823 03/03/16 2036 03/04/16 0451 03/04/16 1129  BP: 118/50 111/42 116/51 118/59  Pulse: 74 90 102 90  Temp:  98.4 F (36.9 C) 98.1 F (36.7 C) 98.2 F (36.8 C)  TempSrc:  Oral Oral Oral  Resp:  18 18 18   Height:      Weight:   63.64 kg (140 lb 4.8 oz)   SpO2:  99% 98% 98%    General: Alert, Awake and Oriented to Time, Place and Person. Appear in mild distress Eyes: PERRL, Conjunctiva normal ENT: Oral Mucosa clear moist. Cardiovascular: S1 and S2 Present, no Murmur, Respiratory: Bilateral Air entry equal and Decreased, Clear to Auscultation, no Crackles, no wheezes Abdomen: Bowel Sound present, Soft and mild tenderness Skin: no redness, no Rash  Extremities: no Pedal edema, no calf tenderness Neurologic: Grossly no focal neuro deficit. Bilaterally Equal motor strength  Data Reviewed: CBC:  Recent Labs Lab 02/29/16 0410 03/01/16 0500 03/02/16 0515 03/03/16 0445 03/04/16 0505  WBC 6.9 6.6 6.3 6.7 6.9  NEUTROABS  --  4.0  --   --   --  HGB 8.0* 8.0* 8.3* 9.0* 9.0*  HCT 25.1* 25.6* 27.1* 28.3* 28.9*  MCV 93.3 94.8 96.4 99.3 100.3*  PLT 233 230 250 249 [id number]   Basic Metabolic Panel:  Recent Labs Lab 02/27/16 0520  02/29/16 0410 03/01/16 0500 03/02/16 0515 03/03/16 0445 03/04/16 0505  NA 135  < > 135 136 136 134* 137  K 3.9  < > 4.0 4.0 3.9 4.2 3.8  CL 108  < > 108 107 109 105 109  CO2 22  < > 22 23 22  21* 20*  GLUCOSE 115*  < > 109* 116* 114* 295* 96  BUN 21*  < > 17 16 17 17 19   CREATININE 0.69  < > 0.66 0.73 0.70 0.77 0.71  CALCIUM 8.1*  < > 8.5* 8.5* 8.5* 8.6* 8.5*  MG 1.8  < > 1.8 1.7 1.9 1.8 1.8  PHOS 4.4  --   --  4.5  --   --  4.7*  < > = values in this interval not displayed.  Liver Function Tests:  Recent Labs Lab  03/01/16 0500 03/04/16 0505  AST 44* 33  ALT 44 35  ALKPHOS 117 107  BILITOT 0.6 0.7  PROT 5.2* 5.5*  ALBUMIN 2.1* 2.3*   No results for input(s): LIPASE, AMYLASE in the last 168 hours. No results for input(s): AMMONIA in the last 168 hours. Coagulation Profile:  Recent Labs Lab 02/29/16 0410 03/01/16 0500 03/02/16 0515 03/03/16 0445 03/04/16 0505  INR 1.24 1.30 1.31 1.15 1.23   Cardiac Enzymes: No results for input(s): CKTOTAL, CKMB, CKMBINDEX, TROPONINI in the last 168 hours. BNP (last 3 results) No results for input(s): PROBNP in the last 8760 hours.  CBG:  Recent Labs Lab 03/03/16 2025 03/04/16 0009 03/04/16 0410 03/04/16 0729 03/04/16 1129  GLUCAP 88 126* 121* 116* 130*    Studies: No results found.   Scheduled Meds: . antiseptic oral rinse  7 mL Mouth Rinse BID  . enoxaparin (LOVENOX) injection  40 mg Subcutaneous Q24H  . erythromycin  250 mg Intravenous Q8H  . famotidine (PEPCID) IV  20 mg Intravenous Q12H  . feeding supplement (VITAL 1.5 CAL)  1,000 mL Per Tube Q24H  . insulin aspart  0-9 Units Subcutaneous Q4H  . levothyroxine  25 mcg Intravenous Daily  . lidocaine  1 patch Transdermal Q24H  . magnesium sulfate 1 - 4 g bolus IVPB  3 g Intravenous Once  . metoprolol  10 mg Intravenous Q6H  . pantoprazole  40 mg Oral BID  . pregabalin  75 mg Oral BID   Continuous Infusions: . sodium chloride 10 mL/hr at 03/04/16 0250  . Marland KitchenTPN (CLINIMIX-E) Adult     And  . fat emulsion    . Marland KitchenTPN (CLINIMIX-E) Adult 70 mL/hr at 03/03/16 1739   And  . fat emulsion 240 mL (03/03/16 1739)   PRN Meds: acetaminophen **OR** acetaminophen, alum & mag hydroxide-simeth, calcium carbonate, diltiazem, gi cocktail, LORazepam, methocarbamol (ROBAXIN)  IV, ondansetron **OR** ondansetron (ZOFRAN) IV, promethazine, sodium chloride flush  Time spent: 30 minutes  Author: Berle Mull, MD [facility] Pager: [phone] 03/04/2016 1:09 PM  If 7PM-7AM, please contact  night-coverage at [email or web address], password [facility]

## 2016-03-04 NOTE — Progress Notes (Signed)
Pt daugther requested patch no be removed till next dose since previous rn applied patch late.

## 2016-03-04 NOTE — Progress Notes (Signed)
Patient ID: [name], female   DOB: May 03, 1934, 82 y.o.   MRN: [id number] 9 Days Post-Op  Subjective: Tolerating feeds, but having retching again.    Objective: Vital signs in last 24 hours: Temp:  [98.1 F (36.7 C)-98.4 F (36.9 C)] 98.2 F (36.8 C) (06/29 1129) Pulse Rate:  [74-102] 90 (06/29 1129) Resp:  [18] 18 (06/29 1129) BP: (111-118)/(42-59) 118/59 mmHg (06/29 1129) SpO2:  [98 %-99 %] 98 % (06/29 1129) Weight:  [63.64 kg (140 lb 4.8 oz)] 63.64 kg (140 lb 4.8 oz) (06/29 0451) Last BM Date: 03/03/16  Intake/Output from previous day: 06/28 0701 - 06/29 0700 In: 2228.2 [I.V.:132.3; NG/GT:237; [id number] Out: 1529 [Urine:1525; Stool:4] Intake/Output this shift: Total I/O In: -  Out: 300 [Urine:300]  Resp: breathing comfortably Cardio: regular rate and rhythm GI: soft, hematoma at incision looks stable.  No erythema.  Lab Results:   Recent Labs  03/03/16 0445 03/04/16 0505  WBC 6.7 6.9  HGB 9.0* 9.0*  HCT 28.3* 28.9*  PLT 249 251   BMET  Recent Labs  03/03/16 0445 03/04/16 0505  NA 134* 137  K 4.2 3.8  CL 105 109  CO2 21* 20*  GLUCOSE 295* 96  BUN 17 19  CREATININE 0.77 0.71  CALCIUM 8.6* 8.5*   PT/INR  Recent Labs  03/03/16 0445 03/04/16 0505  LABPROT 14.9 15.7*  INR 1.15 1.23   ABG No results for input(s): PHART, HCO3 in the last 72 hours.  Invalid input(s): PCO2, PO2  Studies/Results: No results found.  Anti-infectives: Anti-infectives    Start     Dose/Rate Route Frequency Ordered Stop   03/04/16 2200  erythromycin 250 mg in sodium chloride 0.9 % 100 mL IVPB     250 mg 100 mL/hr over 60 Minutes Intravenous Every 12 hours 03/04/16 0803     03/02/16 1530  erythromycin 250 mg in sodium chloride 0.9 % 100 mL IVPB  Status:  Discontinued     250 mg 100 mL/hr over 60 Minutes Intravenous Every 6 hours 03/02/16 1424 03/04/16 0803   02/29/16 1000  fluconazole (DIFLUCAN) IVPB 200 mg     200 mg 100 mL/hr over 60 Minutes Intravenous  Every 24 hours 02/29/16 0847 03/02/16 1111   02/24/16 1000  ceFAZolin (ANCEF) IVPB 2g/100 mL premix    Comments:  On call to OR.   2 g 200 mL/hr over 30 Minutes Intravenous To ShortStay Surgical 02/24/16 0848 02/24/16 1215   02/18/16 1000  fluconazole (DIFLUCAN) IVPB 200 mg  Status:  Discontinued     200 mg 100 mL/hr over 60 Minutes Intravenous Every 24 hours 02/18/16 0917 02/29/16 0847   02/17/16 2200  erythromycin 250 mg in sodium chloride 0.9 % 100 mL IVPB  Status:  Discontinued     250 mg 100 mL/hr over 60 Minutes Intravenous Every 12 hours 02/17/16 1720 02/17/16 1853   02/17/16 2100  erythromycin 250 mg in sodium chloride 0.9 % 100 mL IVPB  Status:  Discontinued     250 mg 100 mL/hr over 60 Minutes Intravenous Every 12 hours 02/17/16 1853 02/20/16 0737      Assessment/Plan: s/p Procedure(s): OPEN J TUBE (Left)  Heating pad prn.  slow tube feed advance.  Goal feeds 45 ml/hr.  Anticipate some diarrhea. .    Holding heparin currently due to hematoma at incision.   LOS: 14 days    [name] 03/04/2016

## 2016-03-04 NOTE — Progress Notes (Signed)
Pt a/o, no c/o pain, pt has some indigestion, sched meds given as ordered, pt on tube feed @ 66ml/hr, pt also on TPN, tolerating well, daughters at bedside, pt stable, VSS

## 2016-03-04 NOTE — Progress Notes (Signed)
Nutrition Follow-up  DOCUMENTATION CODES:   Non-severe (moderate) malnutrition in context of chronic illness  INTERVENTION:  TPN management per pharmacy Recommend increasing Vital 1.5 via J-tube as tolerated to goal rate of 45 ml/hr.   NUTRITION DIAGNOSIS:   Malnutrition related to chronic illness, altered GI function as evidenced by mild depletion of body fat, mild depletion of muscle mass, percent weight loss.  Ongoing  GOAL:   Patient will meet greater than or equal to 90% of their needs  Being met  MONITOR:   Diet advancement, PO intake, Skin, I & O's, Labs, Weight trends  REASON FOR ASSESSMENT:   Consult Enteral/tube feeding initiation and management  ASSESSMENT:   82 y.o. female with a Past Medical History of PAF CAD, tachy/brady syndrome, RLS, GERD, uterine cancer and renal cancer, HTN, HLD, hypothyroidism, gastroparesis, Adenocarcinoma of the stomach s/p partial gastrectomy, ckd, and cva, who presents with nausea vomiting abdominal pain. This seems to be a recurrent issue for patient ever since she underwent partial gastrectomy.   Pt receiving Vital 1.5@ 10 ml/hr at time of visit and tolerating well. She reports one episode of indigestion/reflux this morning, but feeling better now. Per MD note, plan to increase rate of Vital 1.5 by 5 ml/hr every 12 hours; 15 ml/hr this afternoon. Per Pharmacy note, plan to decrease rate of TPN to 50 ml/hr today.  Vital 1.5 @ 15 ml/hr will provide 540 kcal and 24 grams of protein. Weight stable, up from admission.   Labs reviewed.   Diet Order:  Diet NPO time specified TPN (CLINIMIX-E) Adult TPN (CLINIMIX-E) Adult  Skin:  Reviewed, no issues  Last BM:  6/28  Height:   Ht Readings from Last 1 Encounters:  02/17/16 '5\' 1"'$  (1.549 m)    Weight:   Wt Readings from Last 1 Encounters:  03/04/16 140 lb 4.8 oz (63.64 kg)    Ideal Body Weight:  47.7 kg  BMI:  Body mass index is 26.52 kg/(m^2).  Estimated Nutritional  Needs:   Kcal:  1500-1700  Protein:  75-90 grams  Fluid:  1.5-1.7 L/day  EDUCATION NEEDS:   No education needs identified at this time  [place], LDN Inpatient Clinical Dietitian Pager: [phone] After Hours Pager: [phone]

## 2016-03-04 NOTE — Progress Notes (Signed)
Patient's daughter removed lidoderm patch at 10am during patient's bath.  Daughter stated that it was placed late around 7:20pm.  Pharmacist made aware to adjust the timing of the patch.

## 2016-03-05 LAB — GLUCOSE, CAPILLARY
GLUCOSE-CAPILLARY: 116 mg/dL — AB (ref 65–99)
GLUCOSE-CAPILLARY: 117 mg/dL — AB (ref 65–99)
GLUCOSE-CAPILLARY: 121 mg/dL — AB (ref 65–99)
GLUCOSE-CAPILLARY: 129 mg/dL — AB (ref 65–99)
Glucose-Capillary: 137 mg/dL — ABNORMAL HIGH (ref 65–99)
Glucose-Capillary: 121 mg/dL — ABNORMAL HIGH (ref 65–99)
Glucose-Capillary: 93 mg/dL (ref 65–99)

## 2016-03-05 LAB — CBC
HCT: 30.2 % — ABNORMAL LOW (ref 36.0–46.0)
Hemoglobin: 8 g/dL — ABNORMAL LOW (ref 12.0–15.0)
MCH: 38.3 pg — ABNORMAL HIGH (ref 26.0–34.0)
MCHC: 26.5 g/dL — ABNORMAL LOW (ref 30.0–36.0)
MCV: 144.5 fL — ABNORMAL HIGH (ref 78.0–100.0)
PLATELETS: 201 10*3/uL (ref 150–400)
RBC: 2.09 MIL/uL — ABNORMAL LOW (ref 3.87–5.11)
RDW: 24.4 % — AB (ref 11.5–15.5)
WBC: 5.7 10*3/uL (ref 4.0–10.5)

## 2016-03-05 LAB — PROTIME-INR
INR: 1.38 (ref 0.00–1.49)
Prothrombin Time: 17.1 seconds — ABNORMAL HIGH (ref 11.6–15.2)

## 2016-03-05 LAB — MAGNESIUM: Magnesium: 2.4 mg/dL (ref 1.7–2.4)

## 2016-03-05 MED ORDER — TRACE MINERALS CR-CU-MN-SE-ZN 10-1000-500-60 MCG/ML IV SOLN
INTRAVENOUS | Status: AC
  Administered 2016-03-05: 18:00:00 via INTRAVENOUS
  Filled 2016-03-05: qty 960

## 2016-03-05 MED ORDER — GI COCKTAIL ~~LOC~~
30.0000 mL | Freq: Three times a day (TID) | ORAL | Status: DC
  Administered 2016-03-05 – 2016-03-06 (×4): 30 mL via ORAL
  Filled 2016-03-05 (×6): qty 30

## 2016-03-05 MED ORDER — VITAL 1.5 CAL PO LIQD
1000.0000 mL | ORAL | Status: DC
  Administered 2016-03-06 – 2016-03-08 (×4): 1000 mL
  Filled 2016-03-05 (×6): qty 1000

## 2016-03-05 MED ORDER — FAT EMULSION 20 % IV EMUL
120.0000 mL | INTRAVENOUS | Status: AC
  Administered 2016-03-05: 120 mL via INTRAVENOUS
  Filled 2016-03-05: qty 200

## 2016-03-05 MED ORDER — SUCRALFATE 1 GM/10ML PO SUSP
1.0000 g | Freq: Three times a day (TID) | ORAL | Status: DC
  Administered 2016-03-05 – 2016-03-16 (×29): 1 g via ORAL
  Filled 2016-03-05 (×31): qty 10

## 2016-03-05 MED ORDER — SODIUM CHLORIDE 0.9 % IV SOLN
250.0000 mg | Freq: Four times a day (QID) | INTRAVENOUS | Status: DC
  Administered 2016-03-05 – 2016-03-08 (×11): 250 mg via INTRAVENOUS
  Filled 2016-03-05 (×13): qty 5

## 2016-03-05 MED ORDER — ALTEPLASE 2 MG IJ SOLR
2.0000 mg | Freq: Once | INTRAMUSCULAR | Status: AC
  Administered 2016-03-05: 2 mg

## 2016-03-05 NOTE — Progress Notes (Signed)
Patient ID: [name], female   DOB: 1934-03-11, 82 y.o.   MRN: [id number] 10 Days Post-Op  Subjective: Continues to have episodes of retching.    Objective: Vital signs in last 24 hours: Temp:  [98.8 F (37.1 C)-98.9 F (37.2 C)] 98.8 F (37.1 C) (06/30 1111) Pulse Rate:  [97-109] 97 (06/30 1111) Resp:  [18] 18 (06/30 1111) BP: (109-152)/(47-74) 152/59 mmHg (06/30 1111) SpO2:  [98 %-100 %] 100 % (06/30 1111) Last BM Date: 03/04/16  Intake/Output from previous day: 06/29 0701 - 06/30 0700 In: 968 [I.V.:70; NG/GT:189; IV Piggyback:55; TPN:654] Out: 1525 [Urine:1525] Intake/Output this shift: Total I/O In: 40 [I.V.:40] Out: 503 [Urine:500; Stool:3]  Resp: breathing comfortably Cardio: regular rate and rhythm GI: soft, hematoma at incision looks stable.  No erythema.  Lab Results:   Recent Labs  03/04/16 0505 03/05/16 0925  WBC 6.9 5.7  HGB 9.0* 8.0*  HCT 28.9* 30.2*  PLT 251 201   BMET  Recent Labs  03/03/16 0445 03/04/16 0505  NA 134* 137  K 4.2 3.8  CL 105 109  CO2 21* 20*  GLUCOSE 295* 96  BUN 17 19  CREATININE 0.77 0.71  CALCIUM 8.6* 8.5*   PT/INR  Recent Labs  03/04/16 0505 03/05/16 0925  LABPROT 15.7* 17.1*  INR 1.23 1.38   ABG No results for input(s): PHART, HCO3 in the last 72 hours.  Invalid input(s): PCO2, PO2  Studies/Results: No results found.  Anti-infectives: Anti-infectives    Start     Dose/Rate Route Frequency Ordered Stop   03/05/16 1600  erythromycin 250 mg in sodium chloride 0.9 % 100 mL IVPB     250 mg 100 mL/hr over 60 Minutes Intravenous Every 6 hours 03/05/16 1120     03/04/16 2200  erythromycin 250 mg in sodium chloride 0.9 % 100 mL IVPB  Status:  Discontinued     250 mg 100 mL/hr over 60 Minutes Intravenous Every 12 hours 03/04/16 0803 03/04/16 1306   03/04/16 1400  erythromycin 250 mg in sodium chloride 0.9 % 100 mL IVPB  Status:  Discontinued     250 mg 100 mL/hr over 60 Minutes Intravenous Every 8 hours  03/04/16 1306 03/05/16 1120   03/02/16 1530  erythromycin 250 mg in sodium chloride 0.9 % 100 mL IVPB  Status:  Discontinued     250 mg 100 mL/hr over 60 Minutes Intravenous Every 6 hours 03/02/16 1424 03/04/16 0803   02/29/16 1000  fluconazole (DIFLUCAN) IVPB 200 mg     200 mg 100 mL/hr over 60 Minutes Intravenous Every 24 hours 02/29/16 0847 03/02/16 1111   02/24/16 1000  ceFAZolin (ANCEF) IVPB 2g/100 mL premix    Comments:  On call to OR.   2 g 200 mL/hr over 30 Minutes Intravenous To ShortStay Surgical 02/24/16 0848 02/24/16 1215   02/18/16 1000  fluconazole (DIFLUCAN) IVPB 200 mg  Status:  Discontinued     200 mg 100 mL/hr over 60 Minutes Intravenous Every 24 hours 02/18/16 0917 02/29/16 0847   02/17/16 2200  erythromycin 250 mg in sodium chloride 0.9 % 100 mL IVPB  Status:  Discontinued     250 mg 100 mL/hr over 60 Minutes Intravenous Every 12 hours 02/17/16 1720 02/17/16 1853   02/17/16 2100  erythromycin 250 mg in sodium chloride 0.9 % 100 mL IVPB  Status:  Discontinued     250 mg 100 mL/hr over 60 Minutes Intravenous Every 12 hours 02/17/16 1853 02/20/16 0737  Assessment/Plan: s/p Procedure(s): OPEN J TUBE (Left)  Heating pad prn.  Going to accelerate increase in tube feeds 5 ml/hr q 6 hours to goal of 45 ml/hr  Try scheduled GI cocktail today.    Holding heparin currently due to hematoma at incision.  Can probably restart Monday.   LOS: 15 days    Santresa Levett 03/05/2016

## 2016-03-05 NOTE — Progress Notes (Signed)
PARENTERAL NUTRITION CONSULT NOTE - FOLLOW UP  Pharmacy Consult for TPN Indication: Intolerance to enteral feeds  Allergies  Allergen Reactions  . Amiodarone Other (See Comments)     Thyroid and liver and kidney problems  . Codeine Nausea And Vomiting  . Gabapentin Itching  . Hydrocodone Other (See Comments)    hallucination  . Morphine And Related Nausea And Vomiting  . Requip [Ropinirole Hcl]     Headache   . Zinc Nausea Only    nausea  . Reglan [Metoclopramide] Hives, Itching and Rash    Made face red, also    Patient Measurements: Height: 5\' 1"  (154.9 cm) Weight:  (pt family refused wt) IBW/kg (Calculated) : 47.8   Vital Signs: Temp: 98.8 F (37.1 C) (06/30 0613) Temp Source: Oral (06/30 [id number]) BP: 150/74 mmHg (06/30 [id number]) Pulse Rate: 106 (06/30 0613) Intake/Output from previous day: 06/29 0701 - 06/30 0700 In: 968 [I.V.:70; NG/GT:189; IV Piggyback:55; TPN:654] Out: 1525 [Urine:1525] Intake/Output from this shift:    Labs:  Recent Labs  03/03/16 0445 03/04/16 0505  WBC 6.7 6.9  HGB 9.0* 9.0*  HCT 28.3* 28.9*  PLT 249 251  INR 1.15 1.23     Recent Labs  03/03/16 0445 03/04/16 0505  NA 134* 137  K 4.2 3.8  CL 105 109  CO2 21* 20*  GLUCOSE 295* 96  BUN 17 19  CREATININE 0.77 0.71  CALCIUM 8.6* 8.5*  MG 1.8 1.8  PHOS  --  4.7*  PROT  --  5.5*  ALBUMIN  --  2.3*  AST  --  33  ALT  --  35  ALKPHOS  --  107  BILITOT  --  0.7   Estimated Creatinine Clearance: 47.9 mL/min (by C-G formula based on Cr of 0.71).    Recent Labs  03/05/16 0012 03/05/16 0338 03/05/16 0729  GLUCAP 129* 137* 121*    Medications:  Scheduled:  . antiseptic oral rinse  7 mL Mouth Rinse BID  . enoxaparin (LOVENOX) injection  40 mg Subcutaneous Q24H  . erythromycin  250 mg Intravenous Q8H  . famotidine (PEPCID) IV  20 mg Intravenous Q12H  . feeding supplement (VITAL 1.5 CAL)  1,000 mL Per Tube Q24H  . insulin aspart  0-9 Units Subcutaneous Q4H  .  levothyroxine  25 mcg Intravenous Daily  . lidocaine  1 patch Transdermal Q24H  . metoprolol  10 mg Intravenous Q6H  . pantoprazole  40 mg Oral BID  . pregabalin  75 mg Oral BID     Insulin Requirements in the past 24 hours:  3 units on SSI sensitive q4h  Admit: 27 YOF with history of kidney and gastric cancer, s/p proximal gastrectomy in early May. She was readmitted earlier in June with unretractable N/V and was on TPN at that time. This was discontinued prior to her going home- confirmed with Wynona Luna from [place].   Surgeries/Procedures: 6/20: Jtube placed  GI: GERD / anal fissure / gastric cancer. EGD during last admission showed esophagitis, gastric ulcer at gastric anastomosis, was to be on liquids only for 2 weeks at home. S/P Emycin for gastroparesis (6/13 >> 6/16), resumed 6/29. JT placed & have attempted resuming TF x 2, but stopped each time after 6hr d/t N/V (6/21 & 6/24). Prealbumin 19.7>12.9>13. Pt with abdominal pain/vomiting noted on 6/26 - CT showed a new anterior abdominal wall hematoma. Per GI, to attempt restart of J-tube feeds since pt noted to have N/V with and without them -  to slowly advance as tolerated; tolerating with reduced N/V.  LBM 6/29.  (+)N this AM- Zofran & Phenergan given.  NPO (6/25 >> ). Pepcid IVPB per request of family (vs in TPN). Pivot 1.5 at 47ml/hr (goal 10ml/hr), Erythromycin, Pepcid IVPB q12, PPI-po q12, Ondansetron prn, Phenergan prn  Endo: Hypothyroid on Synthroid- back to IV 6/25. No hx DM. CBGs 88-126 on SSI  Lytes:  Mg 2.4  Renal: SCr 0.71 stable, CrCl ~40-36mL/min, UOP 1 ml/kg/hr  Pulm: RA  Cards: PAF / CAD / AS / tachy-brady syndrome post PPM / HTN / HLD / CHF. BP soft-wnl, HR wnl-tachy. On lopressor IV  Hepatobil: LFTs wnl, albumin 2.1, TG 93 (6/26)  Neuro: neuropathy / RLS / migraine / anxiety / hx CVA. - Pregabalin, lidocaine TD, robaxin prn  ID: Pressure ulcer identified - esophagitis - s/p Fluconazole thru  6/27. Afeb, WBC wnl  AC/heme: Warfarin PTA for hx Afib - transitioned to Hep IV while NPO with TPN - CT on 6/26 showed new anterior abdominal wall hematoma - AC now held momentarily, f/u restart plans.  Hgb 9, plts wnl. Enox 40 started for VTE px  Best Practices:Enox 40, MC  TPN Access: PICC placed 6/14 TPN start date: 6/14>>  Current Nutrition:  Clinimix E 5/15 at 66mL/hr + IV lipids at 79mL/hr - this provides 84g protein and 1672kcal which meets 100% of needs Vital 1.5 @ 20 cc/hr will provide 720 kcal and 32g protein per day  Nutritional Goals: (per RD recommendations 6/28) 1500-1700 kCal, 75-90 grams of protein per day  Plan:  - Reduce Clinimix E 5/15 to 40 mL/hr  - Reduce IV lipids to 5 mL/hr - Clinimix + lipids + TFs will provide 1588 kcal and 80g protein per day - meeting 100% of kcal and protein needs - MVI and trace elements in TPN - Continue sensitive SSI and CBGs q4h - Advancing TF q6hr to goal rate 66ml/hr, should be able to stop TPN 7/1 if able to achieve goal rate - Will f/u with TF advancement/tolerance and eventual weaning and d/c of TPN - Arranging for home TF   Gracy Bruins, PharmD Clinical Pharmacist [facility]

## 2016-03-05 NOTE — Care Management Important Message (Signed)
Important Message  Patient Details  Name: [name] MRN: [id number] Date of Birth: 03-Mar-1934   Medicare Important Message Given:  Yes    Emre Stock Abena 03/05/2016, 11:22 AM

## 2016-03-05 NOTE — Progress Notes (Signed)
CM following for DCP; Will order / arrange for home tube feeding when pt is close to discharging home. Mindi Slicker [facility] [phone]

## 2016-03-05 NOTE — Progress Notes (Signed)
PT Cancellation Note  Patient Details Name: [name] MRN: [id number] DOB: 08-20-1934   Cancelled Treatment:    Reason Eval/Treat Not Completed: Other (comment)   RN reports pt has not felt well today and is currently sleeping. She requested PT not awaken patient. Will continue to follow and assess ability to discharge home with HHPT and family assist.   Margrett Kalb 03/05/2016, 2:51 PM  Pager [phone]

## 2016-03-05 NOTE — Progress Notes (Signed)
Pt nauseated w/ expectoration of mucus during the night, Zofran given with minimum relief, family requested for Phenergan to be given along with Baclofen. Patient given both meds, resting quietly in recliner, daughters present at bedside, will continue to monitor.

## 2016-03-05 NOTE — Progress Notes (Signed)
[facility] Progress Note  Patient: [name] [id number]   PCP: [name], MD DOB: 01-Apr-1934   DOA: 02/17/2016   DOS: 03/05/2016   Date of Service: the patient was seen and examined on 03/05/2016  Subjective: The patient is complaining of significant indigestion and family mentions that when she starts having symptoms of acid reflux this is followed by a sense of nausea and dry heaving. Active vomiting he did patient has been having irregular lose bowel movement. No abdominal pain no chest pain or shortness of breath and cough. Nutrition: Currently nothing by mouth per surgery  Brief hospital course: Pt. with PMH of A. fib, CKD,HTN, gastric cancer S/P proximal gastrectomy 01/2016; admitted on 02/17/2016, with complaint of vomiting, was found to have suspected esophagitis. Patient was given IV Diflucan, IV PPI and was started on TPN. Also started on IV erythromycin for motility. With initial treatment the patient was able to tolerate by mouth pills, but no food intake and therefore it was decided to place a J-tube, procedure done on 02/24/2016. Since the procedure pt has developed ileus which Appears to be improving. She also has anemia which appears to be dilutional and refusing blood transfusion. CT abdomen shows evidence of hematoma with any active bleeding. No other acute abnormality. Currently further plan is monitor for recovery from ileus and improvement in symptoms with indigestion.  Assessment and Plan: 1. Gastroesophageal reflux disease with esophagitis causing intractable nausea and vomiting Gastric adenocarcinoma GERD with esophagitis Recent subtotal gastrectomy for gastric adenocarcinoma May 2017 Surgical site hematoma.  remains nothing by mouth as per surgery. Continues to complain of acid reflux. S/P J-tube placement 02/24/2016. Multiple times back and forth on tube feeding and remaining nothing by mouth. Continue IV Pepcid, oral Protonix twice a day,  add Carafate, and GI cocktail scheduled. Continue Tums when necessary. continue TPN via PICC line. Will need to reduce the amount of nutrition given by TPN. Discussed with surgery and will increase to tube feeding 5 ML per six-hour until goal of 45 ML per hour is reached. Continue Erythromycin scheduled, every 6 hours until tube feedings are at goal. When necessary Zofran when necessary, when necessary Phenergan.  Appreciate GI and general surgery consultation. Diflucan for thrush, for a total 14 days, last day 03/02/2016  2. Paroxysmal A. fib. Essential hypertension, blood pressure well controlled Pacemaker implant Chadsvasc 5. On warfarin  Currently rate controlled. Continue Cardizem when necessary,  On IV lopressor Was given Lovenox preprocedure, postprocedure on heparin for anticoagulation. At present the patient has development of surgical site hematoma and therefore I will hold off on therapeutic anticoagulation.  3. Hypothyroidism. Changing to IV Synthroid at present We will continue to monitor.  4. Postoperative blood loss anemia. Surgical site hematoma, stable Patient's hemoglobin dropped from 12-8.0. No evidence of active bleeding. Patient is 4 L positive between these 2 CBC check. This likely appears dilutional. Patient is refusing blood transfusion,  S/P IV Feraheme 5101, [place] Aranesp 1 as well as injection B-12 on 02/27/2016, after discussion with patient's hematology Dr Irene Limbo. CT abdomen showed evidence of surgical site hematoma what appears to be stable at present. Discontinue therapeutic doses of anticoagulation and use only prophylactic dose of anticoagulation.  5. Dehydration. Mild renal insufficiency. Improvement with IV hydration. We will continue to monitor.  Pain management: When necessary Robaxin, when necessary tramadol, lidocaine patch Activity: Consulted physical therapy, home health 24-hour supervision recommended Bowel regimen: last BM  03/04/2016 Diet: feeding advanced, continue TPN DVT Prophylaxis: on prophylactic  Lovenox.  Advance goals of care discussion: DNR/DNI  Family Communication: family was present at bedside, at the time of interview. The pt provided permission to discuss medical plan with the family. Opportunity was given to ask question and all questions were answered satisfactorily.   Disposition:  Discharge to home with home health. Expected discharge date: 03/08/2016, pending improvement feeding tolerance and GERD symptoms.  Consultants: Gen. surgery Procedures: J-tube placement, PICC line placement, TPN  Antibiotics: Anti-infectives    Start     Dose/Rate Route Frequency Ordered Stop   03/05/16 1600  erythromycin 250 mg in sodium chloride 0.9 % 100 mL IVPB     250 mg 100 mL/hr over 60 Minutes Intravenous Every 6 hours 03/05/16 1120     03/04/16 2200  erythromycin 250 mg in sodium chloride 0.9 % 100 mL IVPB  Status:  Discontinued     250 mg 100 mL/hr over 60 Minutes Intravenous Every 12 hours 03/04/16 0803 03/04/16 1306   03/04/16 1400  erythromycin 250 mg in sodium chloride 0.9 % 100 mL IVPB  Status:  Discontinued     250 mg 100 mL/hr over 60 Minutes Intravenous Every 8 hours 03/04/16 1306 03/05/16 1120   03/02/16 1530  erythromycin 250 mg in sodium chloride 0.9 % 100 mL IVPB  Status:  Discontinued     250 mg 100 mL/hr over 60 Minutes Intravenous Every 6 hours 03/02/16 1424 03/04/16 0803   02/29/16 1000  fluconazole (DIFLUCAN) IVPB 200 mg     200 mg 100 mL/hr over 60 Minutes Intravenous Every 24 hours 02/29/16 0847 03/02/16 1111   02/24/16 1000  ceFAZolin (ANCEF) IVPB 2g/100 mL premix    Comments:  On call to OR.   2 g 200 mL/hr over 30 Minutes Intravenous To ShortStay Surgical 02/24/16 0848 02/24/16 1215   02/18/16 1000  fluconazole (DIFLUCAN) IVPB 200 mg  Status:  Discontinued     200 mg 100 mL/hr over 60 Minutes Intravenous Every 24 hours 02/18/16 0917 02/29/16 0847   02/17/16 2200   erythromycin 250 mg in sodium chloride 0.9 % 100 mL IVPB  Status:  Discontinued     250 mg 100 mL/hr over 60 Minutes Intravenous Every 12 hours 02/17/16 1720 02/17/16 1853   02/17/16 2100  erythromycin 250 mg in sodium chloride 0.9 % 100 mL IVPB  Status:  Discontinued     250 mg 100 mL/hr over 60 Minutes Intravenous Every 12 hours 02/17/16 1853 02/20/16 0737        Intake/Output Summary (Last 24 hours) at 03/05/16 1259 Last data filed at 03/05/16 1243  Gross per 24 hour  Intake   1008 ml  Output   1578 ml  Net   -570 ml   Filed Weights   03/02/16 0526 03/03/16 0441 03/04/16 0451  Weight: 63.277 kg (139 lb 8 oz) 61.825 kg (136 lb 4.8 oz) 63.64 kg (140 lb 4.8 oz)    Objective: Physical Exam: Filed Vitals:   03/04/16 2135 03/05/16 0017 03/05/16 0613 03/05/16 1111  BP: 124/66 109/47 150/74 152/59  Pulse: 109 97 106 97  Temp: 98.9 F (37.2 C)  98.8 F (37.1 C) 98.8 F (37.1 C)  TempSrc: Oral  Oral Oral  Resp: 18  18 18   Height:      Weight:      SpO2: 98%  99% 100%    General: Alert, Awake and Oriented to Time, Place and Person. Appear in mild distress Eyes: PERRL, Conjunctiva normal ENT: Oral Mucosa clear moist. Cardiovascular:  S1 and S2 Present, no Murmur, Respiratory: Bilateral Air entry equal and Decreased, Clear to Auscultation, no Crackles, no wheezes Abdomen: Bowel Sound present, Soft and mild tenderness Skin: no redness, no Rash  Extremities: no Pedal edema, no calf tenderness Neurologic: Grossly no focal neuro deficit. Bilaterally Equal motor strength  Data Reviewed: CBC:  Recent Labs Lab 03/01/16 0500 03/02/16 0515 03/03/16 0445 03/04/16 0505 03/05/16 0925  WBC 6.6 6.3 6.7 6.9 5.7  NEUTROABS 4.0  --   --   --   --   HGB 8.0* 8.3* 9.0* 9.0* 8.0*  HCT 25.6* 27.1* 28.3* 28.9* 30.2*  MCV 94.8 96.4 99.3 100.3* 144.5*  PLT 230 250 249 251 [id number]   Basic Metabolic Panel:  Recent Labs Lab 02/29/16 0410 03/01/16 0500 03/02/16 0515 03/03/16 0445  03/04/16 0505 03/05/16 0925  NA 135 136 136 134* 137  --   K 4.0 4.0 3.9 4.2 3.8  --   CL 108 107 109 105 109  --   CO2 22 23 22  21* 20*  --   GLUCOSE 109* 116* 114* 295* 96  --   BUN 17 16 17 17 19   --   CREATININE 0.66 0.73 0.70 0.77 0.71  --   CALCIUM 8.5* 8.5* 8.5* 8.6* 8.5*  --   MG 1.8 1.7 1.9 1.8 1.8 2.4  PHOS  --  4.5  --   --  4.7*  --     Liver Function Tests:  Recent Labs Lab 03/01/16 0500 03/04/16 0505  AST 44* 33  ALT 44 35  ALKPHOS 117 107  BILITOT 0.6 0.7  PROT 5.2* 5.5*  ALBUMIN 2.1* 2.3*   No results for input(s): LIPASE, AMYLASE in the last 168 hours. No results for input(s): AMMONIA in the last 168 hours. Coagulation Profile:  Recent Labs Lab 03/01/16 0500 03/02/16 0515 03/03/16 0445 03/04/16 0505 03/05/16 0925  INR 1.30 1.31 1.15 1.23 1.38   Cardiac Enzymes: No results for input(s): CKTOTAL, CKMB, CKMBINDEX, TROPONINI in the last 168 hours. BNP (last 3 results) No results for input(s): PROBNP in the last 8760 hours.  CBG:  Recent Labs Lab 03/04/16 2013 03/05/16 0012 03/05/16 0338 03/05/16 0729 03/05/16 1105  GLUCAP 113* 129* 137* 121* 121*    Studies: No results found.   Scheduled Meds: . antiseptic oral rinse  7 mL Mouth Rinse BID  . enoxaparin (LOVENOX) injection  40 mg Subcutaneous Q24H  . erythromycin  250 mg Intravenous Q6H  . famotidine (PEPCID) IV  20 mg Intravenous Q12H  . feeding supplement (VITAL 1.5 CAL)  1,000 mL Per Tube Q24H  . gi cocktail  30 mL Oral TID  . insulin aspart  0-9 Units Subcutaneous Q4H  . levothyroxine  25 mcg Intravenous Daily  . lidocaine  1 patch Transdermal Q24H  . metoprolol  10 mg Intravenous Q6H  . pantoprazole  40 mg Oral BID  . pregabalin  75 mg Oral BID  . sucralfate  1 g Oral TID WC & HS   Continuous Infusions: . sodium chloride 10 mL/hr at 03/05/16 0934  . Marland KitchenTPN (CLINIMIX-E) Adult     And  . fat emulsion    . Marland KitchenTPN (CLINIMIX-E) Adult 50 mL/hr at 03/04/16 1727   And  . fat  emulsion 192 mL (03/04/16 1727)   PRN Meds: acetaminophen **OR** acetaminophen, alum & mag hydroxide-simeth, calcium carbonate, diltiazem, LORazepam, methocarbamol (ROBAXIN)  IV, ondansetron **OR** ondansetron (ZOFRAN) IV, promethazine, sodium chloride flush  Time spent: 30 minutes  Author: Diamantina Providence  Posey Pronto, MD [facility] Pager: [phone] 03/05/2016 12:59 PM  If 7PM-7AM, please contact night-coverage at [email or web address], password [email or web address]

## 2016-03-06 LAB — GLUCOSE, CAPILLARY
GLUCOSE-CAPILLARY: 100 mg/dL — AB (ref 65–99)
GLUCOSE-CAPILLARY: 97 mg/dL (ref 65–99)
Glucose-Capillary: 126 mg/dL — ABNORMAL HIGH (ref 65–99)
Glucose-Capillary: 131 mg/dL — ABNORMAL HIGH (ref 65–99)
Glucose-Capillary: 128 mg/dL — ABNORMAL HIGH (ref 65–99)
Glucose-Capillary: 92 mg/dL (ref 65–99)

## 2016-03-06 LAB — PROTIME-INR
INR: 1.3 (ref 0.00–1.49)
Prothrombin Time: 16.3 seconds — ABNORMAL HIGH (ref 11.6–15.2)

## 2016-03-06 MED ORDER — METOPROLOL TARTRATE 50 MG PO TABS: 50.0000 mg | ORAL_TABLET | Freq: Two times a day (BID) | ORAL | Status: DC

## 2016-03-06 MED ORDER — LEVOTHYROXINE SODIUM 50 MCG PO TABS: 50.0000 ug | ORAL_TABLET | Freq: Every day | ORAL | Status: DC

## 2016-03-06 MED ORDER — PANTOPRAZOLE SODIUM 40 MG IV SOLR
40.0000 mg | Freq: Once | INTRAVENOUS | Status: AC
  Administered 2016-03-06: 40 mg via INTRAVENOUS
  Filled 2016-03-06: qty 40

## 2016-03-06 MED ORDER — LEVOTHYROXINE SODIUM 100 MCG IV SOLR
25.0000 ug | Freq: Every day | INTRAVENOUS | Status: DC
  Administered 2016-03-07 – 2016-03-10 (×4): 25 ug via INTRAVENOUS
  Filled 2016-03-06 (×5): qty 5

## 2016-03-06 MED ORDER — METOPROLOL TARTRATE 5 MG/5ML IV SOLN
10.0000 mg | Freq: Four times a day (QID) | INTRAVENOUS | Status: DC
  Administered 2016-03-06 – 2016-03-08 (×7): 10 mg via INTRAVENOUS
  Filled 2016-03-06 (×7): qty 10

## 2016-03-06 NOTE — Progress Notes (Signed)
11 Days Post-Op  Subjective: Pt still retches   Objective: Vital signs in last 24 hours: Temp:  [98.3 F (36.8 C)-100.1 F (37.8 C)] 100.1 F (37.8 C) (07/01 0447) Pulse Rate:  [83-98] 98 (07/01 0447) Resp:  [18-20] 20 (07/01 0447) BP: (125-168)/(70) 168/70 mmHg (07/01 0447) SpO2:  [97 %-99 %] 97 % (07/01 0447) Weight:  [64.501 kg (142 lb 3.2 oz)] 64.501 kg (142 lb 3.2 oz) (07/01 0447) Last BM Date: 03/05/16  Intake/Output from previous day: 06/30 0701 - 07/01 0700 In: 1108.1 [P.O.:30; I.V.:50; NG/GT:170.8; IV Piggyback:255; TPN:602.3] Out: 1308 [Urine:1300; Stool:8] Intake/Output this shift:    Incision/Wound:small hematoma  No redness or drainage   Lab Results:   Recent Labs  03/04/16 0505 03/05/16 0925  WBC 6.9 5.7  HGB 9.0* 8.0*  HCT 28.9* 30.2*  PLT 251 201   BMET  Recent Labs  03/04/16 0505  NA 137  K 3.8  CL 109  CO2 20*  GLUCOSE 96  BUN 19  CREATININE 0.71  CALCIUM 8.5*   PT/INR  Recent Labs  03/05/16 0925 03/06/16 1040  LABPROT 17.1* 16.3*  INR 1.38 1.30   ABG No results for input(s): PHART, HCO3 in the last 72 hours.  Invalid input(s): PCO2, PO2  Studies/Results: No results found.  Anti-infectives: Anti-infectives    Start     Dose/Rate Route Frequency Ordered Stop   03/05/16 1600  erythromycin 250 mg in sodium chloride 0.9 % 100 mL IVPB     250 mg 100 mL/hr over 60 Minutes Intravenous Every 6 hours 03/05/16 1120     03/04/16 2200  erythromycin 250 mg in sodium chloride 0.9 % 100 mL IVPB  Status:  Discontinued     250 mg 100 mL/hr over 60 Minutes Intravenous Every 12 hours 03/04/16 0803 03/04/16 1306   03/04/16 1400  erythromycin 250 mg in sodium chloride 0.9 % 100 mL IVPB  Status:  Discontinued     250 mg 100 mL/hr over 60 Minutes Intravenous Every 8 hours 03/04/16 1306 03/05/16 1120   03/02/16 1530  erythromycin 250 mg in sodium chloride 0.9 % 100 mL IVPB  Status:  Discontinued     250 mg 100 mL/hr over 60 Minutes  Intravenous Every 6 hours 03/02/16 1424 03/04/16 0803   02/29/16 1000  fluconazole (DIFLUCAN) IVPB 200 mg     200 mg 100 mL/hr over 60 Minutes Intravenous Every 24 hours 02/29/16 0847 03/02/16 1111   02/24/16 1000  ceFAZolin (ANCEF) IVPB 2g/100 mL premix    Comments:  On call to OR.   2 g 200 mL/hr over 30 Minutes Intravenous To ShortStay Surgical 02/24/16 0848 02/24/16 1215   02/18/16 1000  fluconazole (DIFLUCAN) IVPB 200 mg  Status:  Discontinued     200 mg 100 mL/hr over 60 Minutes Intravenous Every 24 hours 02/18/16 0917 02/29/16 0847   02/17/16 2200  erythromycin 250 mg in sodium chloride 0.9 % 100 mL IVPB  Status:  Discontinued     250 mg 100 mL/hr over 60 Minutes Intravenous Every 12 hours 02/17/16 1720 02/17/16 1853   02/17/16 2100  erythromycin 250 mg in sodium chloride 0.9 % 100 mL IVPB  Status:  Discontinued     250 mg 100 mL/hr over 60 Minutes Intravenous Every 12 hours 02/17/16 1853 02/20/16 0737      Assessment/Plan: s/p Procedure(s): OPEN J TUBE (Left) Hematoma wound stable Ok to stop TNA    LOS: 16 days    Ainslee Sou A. 03/06/2016

## 2016-03-06 NOTE — Progress Notes (Signed)
Pt is not tolerating any oral medicines keeps throwing up, provided Zofran,phenergan, and robaxin for muscle spasm throughout the time, MD aware, Family members are at bed side and updating, J-tube feeding continue @ 15 now goal is 12, will continue to monitor the patient.

## 2016-03-06 NOTE — Progress Notes (Signed)
PARENTERAL NUTRITION CONSULT NOTE - FOLLOW UP  Pharmacy Consult for TPN Indication: Intolerance to enteral feeds  Allergies  Allergen Reactions  . Amiodarone Other (See Comments)     Thyroid and liver and kidney problems  . Codeine Nausea And Vomiting  . Gabapentin Itching  . Hydrocodone Other (See Comments)    hallucination  . Morphine And Related Nausea And Vomiting  . Requip [Ropinirole Hcl]     Headache   . Zinc Nausea Only    nausea  . Reglan [Metoclopramide] Hives, Itching and Rash    Made face red, also    Patient Measurements: Height: 5\' 1"  (154.9 cm) Weight: 142 lb 3.2 oz (64.501 kg) (bedscale) IBW/kg (Calculated) : 47.8   Vital Signs: Temp: 100.1 F (37.8 C) (07/01 0447) Temp Source: Oral (07/01 0447) BP: 168/70 mmHg (07/01 0447) Pulse Rate: 98 (07/01 0447) Intake/Output from previous day: 06/30 0701 - 07/01 0700 In: 80 [P.O.:30; I.V.:50] Out: 1308 [Urine:1300; Stool:8] Intake/Output from this shift:    Labs:  Recent Labs  03/04/16 0505 03/05/16 0925  WBC 6.9 5.7  HGB 9.0* 8.0*  HCT 28.9* 30.2*  PLT 251 201  INR 1.23 1.38     Recent Labs  03/04/16 0505 03/05/16 0925  NA 137  --   K 3.8  --   CL 109  --   CO2 20*  --   GLUCOSE 96  --   BUN 19  --   CREATININE 0.71  --   CALCIUM 8.5*  --   MG 1.8 2.4  PHOS 4.7*  --   PROT 5.5*  --   ALBUMIN 2.3*  --   AST 33  --   ALT 35  --   ALKPHOS 107  --   BILITOT 0.7  --    Estimated Creatinine Clearance: 48.3 mL/min (by C-G formula based on Cr of 0.71).    Recent Labs  03/05/16 1955 03/05/16 2348 03/06/16 0418  GLUCAP 93 116* 126*    Medications:  Scheduled:  . antiseptic oral rinse  7 mL Mouth Rinse BID  . enoxaparin (LOVENOX) injection  40 mg Subcutaneous Q24H  . erythromycin  250 mg Intravenous Q6H  . famotidine (PEPCID) IV  20 mg Intravenous Q12H  . feeding supplement (VITAL 1.5 CAL)  1,000 mL Per Tube Q24H  . gi cocktail  30 mL Oral TID  . insulin aspart  0-9 Units  Subcutaneous Q4H  . levothyroxine  25 mcg Intravenous Daily  . lidocaine  1 patch Transdermal Q24H  . metoprolol  10 mg Intravenous Q6H  . pantoprazole  40 mg Oral BID  . pregabalin  75 mg Oral BID  . sucralfate  1 g Oral TID WC & HS     Insulin Requirements in the past 24 hours:  4 units on SSI sensitive q4h  Admit: 8 YOF with history of kidney and gastric cancer, s/p proximal gastrectomy in early May. She was readmitted earlier in June with unretractable N/V and was on TPN at that time. This was discontinued prior to her going home- confirmed with Wynona Luna from [place].   Surgeries/Procedures: 6/20: Jtube placed  GI: GERD / anal fissure / gastric cancer. EGD during last admission showed esophagitis, gastric ulcer at gastric anastomosis, was to be on liquids only for 2 weeks at home. S/P Emycin for gastroparesis (6/13 >> 6/16), resumed 6/29. JT placed & have attempted resuming TF x 2, but stopped each time after 6hr d/t N/V (6/21 & 6/24).  Prealbumin 19.7>12.9>13. Pt with abdominal pain/vomiting noted on 6/26 - CT showed a new anterior abdominal wall hematoma. Per GI, to attempt restart of J-tube feeds since pt noted to have N/V with and without them - to slowly advance as tolerated; tolerating with reduced N/V.  LBM 6/29.  (+)N this AM- Zofran & Phenergan given.  NPO (6/25 >> ). Pepcid IVPB per request of family (vs in TPN). Pivot 1.5 at 30 ml/hr (goal 29ml/hr) - family has been reluctant to titrate up d/t N/V. Recommendations from the RD are to wean and d/c TPN once TFs >20 cc/hr for >/=24 hours. Discussed with surgery Hilbert Odor) today and will stop TPN after today's bag since at 67% at goal TF rate. Erythromycin, Pepcid IVPB q12, PPI-po q12, Ondansetron prn, Phenergan prn  Endo: Hypothyroid on Synthroid- back to IV 6/25. No hx DM. CBGs 93-121 on SSI  Lytes:  Na 137 (6/29), K 3.8 (6/29), Mg 2.4 (6/30), Phos 4.7 (6/29), CoCa9/7 (6/29)  Renal: SCr 0.71 stable, CrCl  ~40-77mL/min, UOP 0.8 ml/kg/hr  Pulm: RA  Cards: PAF / CAD / AS / tachy-brady syndrome post PPM / HTN / HLD / CHF. BP wnl-elevated, HR wnl. On lopressor IV  Hepatobil: LFTs wnl, albumin 2.1, TG 93 (6/26)  Neuro: neuropathy / RLS / migraine / anxiety / hx CVA. - Pregabalin, lidocaine TD, robaxin prn  ID: Pressure ulcer identified - esophagitis - s/p Fluconazole thru 6/27. Afeb, WBC wnl  AC/heme: Warfarin PTA for hx Afib - transitioned to Hep IV while NPO with TPN - CT on 6/26 showed new anterior abdominal wall hematoma - AC now held momentarily, f/u restart plans.  Hgb 8, plts wnl. On Enox 40 for VTE px - MD considering restarting Hep drip on 7/3  Best Practices:Enox 40, MC  TPN Access: PICC placed 6/14 TPN start date: 6/14>>  Current Nutrition:  Clinimix E 5/15 at 29mL/hr + IV lipids at 34mL/hr provides 922 kcal and 48g protein per day Vital 1.5 @ 30 cc/hr will provide 1080 kcal and 48g protein per day  Nutritional Goals: (per RD recommendations 6/28) 1500-1700 kCal, 75-90 grams of protein per day  Plan:  - Will plan to d/c TPN after today's bag since TFs currently at 65% of goal rate (30 ml/hr currently and goal rate of 45 ml/hr) - discussed with surgery - Will d/c TPN labs - Please re-consult [place] if further TPN is warranted for this patient  Alycia Rossetti, PharmD, BCPS Clinical Pharmacist Pager: [phone] 03/06/2016 8:00 AM

## 2016-03-06 NOTE — Progress Notes (Signed)
[facility] Progress Note  Patient: [name] [id number]   PCP: [name], MD DOB: 1934-04-10   DOA: 02/17/2016   DOS: 03/06/2016   Date of Service: the patient was seen and examined on 03/06/2016  Subjective: The patient is complaining of significant indigestion and family mentions that when she starts having symptoms of acid reflux this is followed by a sense of nausea and dry heaving. Active vomiting he did patient has been having irregular lose bowel movement. No abdominal pain no chest pain or shortness of breath and cough. Nutrition: Currently nothing by mouth per surgery  Brief hospital course: Pt. with PMH of A. fib, CKD,HTN, gastric cancer S/P proximal gastrectomy 01/2016; admitted on 02/17/2016, with complaint of vomiting, was found to have suspected esophagitis. Patient was given IV Diflucan, IV PPI and was started on TPN. Also started on IV erythromycin for motility. With initial treatment the patient was able to tolerate by mouth pills, but no food intake and therefore it was decided to place a J-tube, procedure done on 02/24/2016. Since the procedure pt has developed ileus which Appears to be improving. She also has anemia which appears to be dilutional and refusing blood transfusion. CT abdomen shows evidence of hematoma with any active bleeding. No other acute abnormality. Currently further plan is monitor for recovery from ileus and improvement in symptoms with indigestion.  Assessment and Plan: 1. Gastric adenocarcinoma/Recent subtotal gastrectomy for gastric adenocarcinoma May 2017 -with Intractable nausea and vomiting -last EGD 6/1 with Grade D esophagitis and non bleeding gastric ulcers at anastomoses -NPO, Tolerating TFs -gradually being advanced, goal if 45cc/hr -Wean TNA  -recurrent admissions with post op N/V -S/P J-tube placement 02/24/2016. -Continue IV Pepcid, oral Protonix twice a day, Carafate, and GI cocktail scheduled. -also completed  14days of Diflucan for thrush -Appreciate GI and general surgery consultation. Diflucan for thrush, for a total 14 days, last day 03/02/2016 -change meds to via J tube  2. Paroxysmal A. Fib/Pacemaker implant -Chadsvasc 5. On warfarin at home -Currently rate controlled. Continue Cardizem when necessary,  -change Metoprolol to via J tube -anticoagulation stopped due to post op hematoma, reassess in few weeks  3. Hypothyroidism. -on IV synthroid, can change to J tube  4. Postoperative blood loss anemia. -Surgical site hematoma, stable -No evidence of active bleeding and component of hemodilution -Patient refused blood transfusion,  -S/P IV Feraheme 5101,  Aranesp 1 as well as injection B-12 on 02/27/2016, after discussion with patient's hematology Dr Irene Limbo. -CT abdomen showed evidence of surgical site hematoma what appears to be stable at present. -Discontinued therapeutic doses of anticoagulation and use only prophylactic dose of anticoagulation.  5. Dehydration. -Mild renal insufficiency earlier. -on TNA, resolved  6. Adult failure to thrive  DVT Prophylaxis: on prophylactic Lovenox.  Advance goals of care discussion: DNR/DNI  Family Communication: Daughters at bedside  Disposition:  Discharge to home with home health. Expected discharge date: 03/08/2016, pending improvement feeding tolerance and GERD symptoms.  Consultants: Gen. surgery Procedures: J-tube placement, PICC line placement, TPN  Antibiotics: Anti-infectives    Start     Dose/Rate Route Frequency Ordered Stop   03/05/16 1600  erythromycin 250 mg in sodium chloride 0.9 % 100 mL IVPB     250 mg 100 mL/hr over 60 Minutes Intravenous Every 6 hours 03/05/16 1120     03/04/16 2200  erythromycin 250 mg in sodium chloride 0.9 % 100 mL IVPB  Status:  Discontinued     250 mg 100 mL/hr over 60  Minutes Intravenous Every 12 hours 03/04/16 0803 03/04/16 1306   03/04/16 1400  erythromycin 250 mg in sodium chloride 0.9  % 100 mL IVPB  Status:  Discontinued     250 mg 100 mL/hr over 60 Minutes Intravenous Every 8 hours 03/04/16 1306 03/05/16 1120   03/02/16 1530  erythromycin 250 mg in sodium chloride 0.9 % 100 mL IVPB  Status:  Discontinued     250 mg 100 mL/hr over 60 Minutes Intravenous Every 6 hours 03/02/16 1424 03/04/16 0803   02/29/16 1000  fluconazole (DIFLUCAN) IVPB 200 mg     200 mg 100 mL/hr over 60 Minutes Intravenous Every 24 hours 02/29/16 0847 03/02/16 1111   02/24/16 1000  ceFAZolin (ANCEF) IVPB 2g/100 mL premix    Comments:  On call to OR.   2 g 200 mL/hr over 30 Minutes Intravenous To ShortStay Surgical 02/24/16 0848 02/24/16 1215   02/18/16 1000  fluconazole (DIFLUCAN) IVPB 200 mg  Status:  Discontinued     200 mg 100 mL/hr over 60 Minutes Intravenous Every 24 hours 02/18/16 0917 02/29/16 0847   02/17/16 2200  erythromycin 250 mg in sodium chloride 0.9 % 100 mL IVPB  Status:  Discontinued     250 mg 100 mL/hr over 60 Minutes Intravenous Every 12 hours 02/17/16 1720 02/17/16 1853   02/17/16 2100  erythromycin 250 mg in sodium chloride 0.9 % 100 mL IVPB  Status:  Discontinued     250 mg 100 mL/hr over 60 Minutes Intravenous Every 12 hours 02/17/16 1853 02/20/16 0737        Intake/Output Summary (Last 24 hours) at 03/06/16 1221 Last data filed at 03/06/16 0700  Gross per 24 hour  Intake 1068.08 ml  Output    956 ml  Net 112.08 ml   Filed Weights   03/03/16 0441 03/04/16 0451 03/06/16 0447  Weight: 61.825 kg (136 lb 4.8 oz) 63.64 kg (140 lb 4.8 oz) 64.501 kg (142 lb 3.2 oz)    Objective: Physical Exam: Filed Vitals:   03/05/16 0613 03/05/16 1111 03/05/16 1958 03/06/16 0447  BP: 150/74 152/59 125/70 168/70  Pulse: 106 97 83 98  Temp: 98.8 F (37.1 C) 98.8 F (37.1 C) 98.3 F (36.8 C) 100.1 F (37.8 C)  TempSrc: Oral Oral Oral Oral  Resp: 18 18 18 20   Height:      Weight:    64.501 kg (142 lb 3.2 oz)  SpO2: 99% 100% 99% 97%    General: Alert, Awake, frail elderly  woman, chronically ill appearing Eyes: PERRL, Conjunctiva normal ENT: Oral Mucosa clear moist. Cardiovascular: S1 and S2 Present, no Murmur, Respiratory: Bilateral Air entry equal and Decreased, Clear to Auscultation, no Crackles, no wheezes Abdomen: Bowel Sound present, Soft and mild tenderness, surgical scar noted, J tube noted Skin: no redness, no Rash  Extremities: no Pedal edema, no calf tenderness Neurologic: Grossly no focal neuro deficit. Bilaterally Equal motor strength  Data Reviewed: CBC:  Recent Labs Lab 03/01/16 0500 03/02/16 0515 03/03/16 0445 03/04/16 0505 03/05/16 0925  WBC 6.6 6.3 6.7 6.9 5.7  NEUTROABS 4.0  --   --   --   --   HGB 8.0* 8.3* 9.0* 9.0* 8.0*  HCT 25.6* 27.1* 28.3* 28.9* 30.2*  MCV 94.8 96.4 99.3 100.3* 144.5*  PLT 230 250 249 251 [id number]   Basic Metabolic Panel:  Recent Labs Lab 02/29/16 0410 03/01/16 0500 03/02/16 0515 03/03/16 0445 03/04/16 0505 03/05/16 0925  NA 135 136 136 134* 137  --  K 4.0 4.0 3.9 4.2 3.8  --   CL 108 107 109 105 109  --   CO2 22 23 22  21* 20*  --   GLUCOSE 109* 116* 114* 295* 96  --   BUN 17 16 17 17 19   --   CREATININE 0.66 0.73 0.70 0.77 0.71  --   CALCIUM 8.5* 8.5* 8.5* 8.6* 8.5*  --   MG 1.8 1.7 1.9 1.8 1.8 2.4  PHOS  --  4.5  --   --  4.7*  --     Liver Function Tests:  Recent Labs Lab 03/01/16 0500 03/04/16 0505  AST 44* 33  ALT 44 35  ALKPHOS 117 107  BILITOT 0.6 0.7  PROT 5.2* 5.5*  ALBUMIN 2.1* 2.3*   No results for input(s): LIPASE, AMYLASE in the last 168 hours. No results for input(s): AMMONIA in the last 168 hours. Coagulation Profile:  Recent Labs Lab 03/02/16 0515 03/03/16 0445 03/04/16 0505 03/05/16 0925 03/06/16 1040  INR 1.31 1.15 1.23 1.38 1.30   Cardiac Enzymes: No results for input(s): CKTOTAL, CKMB, CKMBINDEX, TROPONINI in the last 168 hours. BNP (last 3 results) No results for input(s): PROBNP in the last 8760 hours.  CBG:  Recent Labs Lab 03/05/16 1955  03/05/16 2348 03/06/16 0418 03/06/16 0746 03/06/16 1214  GLUCAP 93 116* 126* 131* 128*    Studies: No results found.   Scheduled Meds: . antiseptic oral rinse  7 mL Mouth Rinse BID  . enoxaparin (LOVENOX) injection  40 mg Subcutaneous Q24H  . erythromycin  250 mg Intravenous Q6H  . famotidine (PEPCID) IV  20 mg Intravenous Q12H  . feeding supplement (VITAL 1.5 CAL)  1,000 mL Per Tube Q24H  . gi cocktail  30 mL Oral TID  . insulin aspart  0-9 Units Subcutaneous Q4H  . levothyroxine  25 mcg Intravenous Daily  . lidocaine  1 patch Transdermal Q24H  . metoprolol  10 mg Intravenous Q6H  . pantoprazole  40 mg Oral BID  . pregabalin  75 mg Oral BID  . sucralfate  1 g Oral TID WC & HS   Continuous Infusions: . sodium chloride 10 mL/hr at 03/05/16 0934  . Marland KitchenTPN (CLINIMIX-E) Adult 40 mL/hr at 03/05/16 1737   And  . fat emulsion 120 mL (03/05/16 1737)   PRN Meds: acetaminophen **OR** acetaminophen, alum & mag hydroxide-simeth, calcium carbonate, diltiazem, LORazepam, methocarbamol (ROBAXIN)  IV, ondansetron **OR** ondansetron (ZOFRAN) IV, promethazine, sodium chloride flush  Time spent: 30 minutes  Author: Domenic Polite, MD [facility] Pager: [phone]  03/06/2016 12:21 PM  If 7PM-7AM, please contact night-coverage at [email or web address], password [facility]

## 2016-03-06 NOTE — Progress Notes (Signed)
Unable to increased tube feeding by 6ml this morning, pt c/o nausea and vomiting and family members refused to increase tube feeding, zofran 4mg  IV given, will continue to monitor.

## 2016-03-06 NOTE — Progress Notes (Signed)
G-tube feeding increased to 45ml/hr

## 2016-03-07 LAB — BASIC METABOLIC PANEL
Anion gap: 5 (ref 5–15)
BUN: 12 mg/dL (ref 6–20)
CALCIUM: 8.2 mg/dL — AB (ref 8.9–10.3)
CO2: 23 mmol/L (ref 22–32)
CREATININE: 0.72 mg/dL (ref 0.44–1.00)
Chloride: 112 mmol/L — ABNORMAL HIGH (ref 101–111)
GFR calc Af Amer: >60 mL/min (ref >60)
GLUCOSE: 93 mg/dL (ref 65–99)
Potassium: 3.6 mmol/L (ref 3.5–5.1)
SODIUM: 140 mmol/L (ref 135–145)

## 2016-03-07 LAB — GLUCOSE, CAPILLARY
GLUCOSE-CAPILLARY: 93 mg/dL (ref 65–99)
GLUCOSE-CAPILLARY: 109 mg/dL — AB (ref 65–99)
Glucose-Capillary: 89 mg/dL (ref 65–99)
Glucose-Capillary: 102 mg/dL — ABNORMAL HIGH (ref 65–99)
Glucose-Capillary: 107 mg/dL — ABNORMAL HIGH (ref 65–99)

## 2016-03-07 LAB — CBC
HCT: 27.8 % — ABNORMAL LOW (ref 36.0–46.0)
Hemoglobin: 8.2 g/dL — ABNORMAL LOW (ref 12.0–15.0)
MCH: 30.3 pg (ref 26.0–34.0)
MCHC: 29.5 g/dL — AB (ref 30.0–36.0)
MCV: 102.6 fL — AB (ref 78.0–100.0)
PLATELETS: 228 10*3/uL (ref 150–400)
RBC: 2.71 MIL/uL — AB (ref 3.87–5.11)
RDW: 19 % — AB (ref 11.5–15.5)
WBC: 5.9 10*3/uL (ref 4.0–10.5)

## 2016-03-07 LAB — PROTIME-INR
INR: 1.29 (ref 0.00–1.49)
PROTHROMBIN TIME: 16.2 s — AB (ref 11.6–15.2)

## 2016-03-07 MED ORDER — PANTOPRAZOLE SODIUM 40 MG IV SOLR
40.0000 mg | Freq: Two times a day (BID) | INTRAVENOUS | Status: DC
  Administered 2016-03-07 (×2): 40 mg via INTRAVENOUS
  Filled 2016-03-07 (×2): qty 40

## 2016-03-07 MED ORDER — ONDANSETRON 4 MG PO TBDP
4.0000 mg | ORAL_TABLET | Freq: Three times a day (TID) | ORAL | Status: DC | PRN
  Administered 2016-03-07: 4 mg via ORAL
  Filled 2016-03-07 (×3): qty 1

## 2016-03-07 NOTE — Progress Notes (Addendum)
[facility] Progress Note  Patient: [name] [id number]   PCP: [name], MD DOB: 1933-11-14   DOA: 02/17/2016   DOS: 03/07/2016   Date of Service: the patient was seen and examined on 03/07/2016  Subjective: The patient is complaining of significant indigestion and family mentions that when she starts having symptoms of acid reflux this is followed by a sense of nausea and dry heaving. Active vomiting he did patient has been having irregular lose bowel movement. No abdominal pain no chest pain or shortness of breath and cough. Nutrition: Currently nothing by mouth per surgery  Brief hospital course: Pt. with PMH of A. fib, CKD,HTN, gastric cancer S/P proximal gastrectomy 01/2016; admitted on 02/17/2016, with complaint of vomiting, was found to have suspected esophagitis. Patient was given IV Diflucan, IV PPI and was started on TPN. Also started on IV erythromycin for motility. With initial treatment the patient was able to tolerate by mouth pills, but no food intake and therefore it was decided to place a J-tube, procedure done on 02/24/2016. Since the procedure pt has developed ileus which Appears to be improving. She also has anemia which appears to be dilutional and refusing blood transfusion. CT abdomen shows evidence of hematoma with any active bleeding. No other acute abnormality. Still with intermittent nausea and vomiting  Assessment and Plan: 1. Gastric adenocarcinoma/Recent subtotal gastrectomy for gastric adenocarcinoma May 2017 -with Intractable nausea and vomiting -last EGD 6/1 with Grade D esophagitis and non bleeding gastric ulcers at anastomoses -NPO, Tolerating TFs -gradually being advanced, goal of 45cc/hr, now at 35cc/hr -Weaned off TNA  -recurrent admissions with post op N/V -S/P J-tube placement 02/24/2016. -Continue IV Pepcid, IV Protonix twice a day,  -also on Carafate, and GI cocktail -but unable to tolerate much of this -also completed  14days of Diflucan for thrush -Appreciate GI and general surgery consultation -will d/w Dr.Byerly if we can we finely crush Po meds and give Via Jtube: she is unable to take them PO-consistently  2. Paroxysmal A. Fib/Pacemaker implant -Chadsvasc 5. On warfarin at home -Currently rate controlled. Continue Cardizem when necessary,  -still on metoprolol IV  -anticoagulation stopped due to post op hematoma, resume warfarin soon if ok with CCS  3. Hypothyroidism. -on IV synthroid  4. Postoperative blood loss anemia. -Surgical site hematoma, stable -No evidence of active bleeding and component of hemodilution -Patient refused blood transfusion,  -S/P IV Feraheme 5101,  Aranesp 1 as well as injection B-12 on 02/27/2016, after discussion with patient's hematology Dr Irene Limbo. -CT abdomen showed evidence of surgical site hematoma what appears to be stable at present. -Discontinued therapeutic doses of anticoagulation and use only prophylactic dose of anticoagulation.  5. Dehydration. -Mild renal insufficiency earlier. -on TNA, resolved  6. Adult failure to thrive  DVT Prophylaxis: on prophylactic Lovenox.  Advance goals of care discussion: DNR/DNI Family Communication: Daughters at bedside Disposition: Discharge to home with home health.  Consultants: Gen. surgery Procedures: J-tube placement, PICC line placement, TPN  Antibiotics: Anti-infectives    Start     Dose/Rate Route Frequency Ordered Stop   03/05/16 1600  erythromycin 250 mg in sodium chloride 0.9 % 100 mL IVPB     250 mg 100 mL/hr over 60 Minutes Intravenous Every 6 hours 03/05/16 1120     03/04/16 2200  erythromycin 250 mg in sodium chloride 0.9 % 100 mL IVPB  Status:  Discontinued     250 mg 100 mL/hr over 60 Minutes Intravenous Every 12 hours 03/04/16 0803 03/04/16  1306   03/04/16 1400  erythromycin 250 mg in sodium chloride 0.9 % 100 mL IVPB  Status:  Discontinued     250 mg 100 mL/hr over 60 Minutes Intravenous  Every 8 hours 03/04/16 1306 03/05/16 1120   03/02/16 1530  erythromycin 250 mg in sodium chloride 0.9 % 100 mL IVPB  Status:  Discontinued     250 mg 100 mL/hr over 60 Minutes Intravenous Every 6 hours 03/02/16 1424 03/04/16 0803   02/29/16 1000  fluconazole (DIFLUCAN) IVPB 200 mg     200 mg 100 mL/hr over 60 Minutes Intravenous Every 24 hours 02/29/16 0847 03/02/16 1111   02/24/16 1000  ceFAZolin (ANCEF) IVPB 2g/100 mL premix    Comments:  On call to OR.   2 g 200 mL/hr over 30 Minutes Intravenous To ShortStay Surgical 02/24/16 0848 02/24/16 1215   02/18/16 1000  fluconazole (DIFLUCAN) IVPB 200 mg  Status:  Discontinued     200 mg 100 mL/hr over 60 Minutes Intravenous Every 24 hours 02/18/16 0917 02/29/16 0847   02/17/16 2200  erythromycin 250 mg in sodium chloride 0.9 % 100 mL IVPB  Status:  Discontinued     250 mg 100 mL/hr over 60 Minutes Intravenous Every 12 hours 02/17/16 1720 02/17/16 1853   02/17/16 2100  erythromycin 250 mg in sodium chloride 0.9 % 100 mL IVPB  Status:  Discontinued     250 mg 100 mL/hr over 60 Minutes Intravenous Every 12 hours 02/17/16 1853 02/20/16 0737        Intake/Output Summary (Last 24 hours) at 03/07/16 1300 Last data filed at 03/07/16 1100  Gross per 24 hour  Intake 707.42 ml  Output   1025 ml  Net -317.58 ml   Filed Weights   03/04/16 0451 03/06/16 0447 03/07/16 0544  Weight: 63.64 kg (140 lb 4.8 oz) 64.501 kg (142 lb 3.2 oz) 65.681 kg (144 lb 12.8 oz)    Objective: Physical Exam: Filed Vitals:   03/06/16 2030 03/06/16 2326 03/07/16 0544 03/07/16 1121  BP: 134/56 142/50 124/47 141/70  Pulse: 82 81 82 90  Temp: 99.3 F (37.4 C) 99.4 F (37.4 C) 98.4 F (36.9 C) 98.4 F (36.9 C)  TempSrc: Oral Oral Oral Oral  Resp: 18  18 18   Height:      Weight:   65.681 kg (144 lb 12.8 oz)   SpO2: 98%  96% 98%    General: Alert, Awake, frail elderly woman, chronically ill appearing Eyes: PERRL, Conjunctiva normal ENT: Oral Mucosa clear  moist. Cardiovascular: S1 and S2 Present, no Murmur, Respiratory: Bilateral Air entry equal and Decreased, Clear to Auscultation, no Crackles, no wheezes Abdomen: Bowel Sound present, Soft and mild tenderness, surgical scar noted, J tube noted Skin: no redness, no Rash  Extremities: no Pedal edema, no calf tenderness Neurologic: Grossly no focal neuro deficit. Bilaterally Equal motor strength  Data Reviewed: CBC:  Recent Labs Lab 03/01/16 0500 03/02/16 0515 03/03/16 0445 03/04/16 0505 03/05/16 0925 03/07/16 0445  WBC 6.6 6.3 6.7 6.9 5.7 5.9  NEUTROABS 4.0  --   --   --   --   --   HGB 8.0* 8.3* 9.0* 9.0* 8.0* 8.2*  HCT 25.6* 27.1* 28.3* 28.9* 30.2* 27.8*  MCV 94.8 96.4 99.3 100.3* 144.5* 102.6*  PLT 230 250 249 251 201 [id number]   Basic Metabolic Panel:  Recent Labs Lab 03/01/16 0500 03/02/16 0515 03/03/16 0445 03/04/16 0505 03/05/16 0925 03/07/16 0445  NA 136 136 134* 137  --  140  K 4.0 3.9 4.2 3.8  --  3.6  CL 107 109 105 109  --  112*  CO2 23 22 21* 20*  --  23  GLUCOSE 116* 114* 295* 96  --  93  BUN 16 17 17 19   --  12  CREATININE 0.73 0.70 0.77 0.71  --  0.72  CALCIUM 8.5* 8.5* 8.6* 8.5*  --  8.2*  MG 1.7 1.9 1.8 1.8 2.4  --   PHOS 4.5  --   --  4.7*  --   --     Liver Function Tests:  Recent Labs Lab 03/01/16 0500 03/04/16 0505  AST 44* 33  ALT 44 35  ALKPHOS 117 107  BILITOT 0.6 0.7  PROT 5.2* 5.5*  ALBUMIN 2.1* 2.3*   No results for input(s): LIPASE, AMYLASE in the last 168 hours. No results for input(s): AMMONIA in the last 168 hours. Coagulation Profile:  Recent Labs Lab 03/03/16 0445 03/04/16 0505 03/05/16 0925 03/06/16 1040 03/07/16 0445  INR 1.15 1.23 1.38 1.30 1.29   Cardiac Enzymes: No results for input(s): CKTOTAL, CKMB, CKMBINDEX, TROPONINI in the last 168 hours. BNP (last 3 results) No results for input(s): PROBNP in the last 8760 hours.  CBG:  Recent Labs Lab 03/06/16 1921 03/06/16 2354 03/07/16 0429 03/07/16 0808  03/07/16 1119  GLUCAP 92 97 89 102* 93    Studies: No results found.   Scheduled Meds: . antiseptic oral rinse  7 mL Mouth Rinse BID  . enoxaparin (LOVENOX) injection  40 mg Subcutaneous Q24H  . erythromycin  250 mg Intravenous Q6H  . famotidine (PEPCID) IV  20 mg Intravenous Q12H  . feeding supplement (VITAL 1.5 CAL)  1,000 mL Per Tube Q24H  . gi cocktail  30 mL Oral TID  . insulin aspart  0-9 Units Subcutaneous Q4H  . levothyroxine  25 mcg Intravenous Daily  . lidocaine  1 patch Transdermal Q24H  . metoprolol  10 mg Intravenous Q6H  . pantoprazole (PROTONIX) IV  40 mg Intravenous Q12H  . pregabalin  75 mg Oral BID  . sucralfate  1 g Oral TID WC & HS   Continuous Infusions: . sodium chloride 10 mL/hr at 03/05/16 0934   PRN Meds: acetaminophen **OR** acetaminophen, alum & mag hydroxide-simeth, calcium carbonate, diltiazem, LORazepam, methocarbamol (ROBAXIN)  IV, ondansetron **OR** ondansetron (ZOFRAN) IV, ondansetron, promethazine, sodium chloride flush  Time spent: 30 minutes  Author: Domenic Polite, MD [facility] Pager: [phone]  03/07/2016 1:00 PM  If 7PM-7AM, please contact night-coverage at [email or web address], password [facility]

## 2016-03-07 NOTE — Progress Notes (Signed)
Pt had trouble tolerating PO meds, gave Zofran IV PRN as ordered and got a 1x order for Protonix IV, which did help, feed tubing tolerating at 20 ml/hr, will continue to monitor, Thanks Arvella Nigh RN.

## 2016-03-08 LAB — GLUCOSE, CAPILLARY
GLUCOSE-CAPILLARY: 118 mg/dL — AB (ref 65–99)
GLUCOSE-CAPILLARY: 121 mg/dL — AB (ref 65–99)
Glucose-Capillary: 105 mg/dL — ABNORMAL HIGH (ref 65–99)
Glucose-Capillary: 108 mg/dL — ABNORMAL HIGH (ref 65–99)
Glucose-Capillary: 117 mg/dL — ABNORMAL HIGH (ref 65–99)
Glucose-Capillary: 128 mg/dL — ABNORMAL HIGH (ref 65–99)
Glucose-Capillary: 147 mg/dL — ABNORMAL HIGH (ref 65–99)

## 2016-03-08 LAB — CBC
HEMATOCRIT: 28.9 % — AB (ref 36.0–46.0)
HEMOGLOBIN: 8.7 g/dL — AB (ref 12.0–15.0)
MCH: 30.5 pg (ref 26.0–34.0)
MCHC: 30.1 g/dL (ref 30.0–36.0)
MCV: 101.4 fL — AB (ref 78.0–100.0)
Platelets: 238 10*3/uL (ref 150–400)
RBC: 2.85 MIL/uL — AB (ref 3.87–5.11)
RDW: 19.1 % — ABNORMAL HIGH (ref 11.5–15.5)
WBC: 6 10*3/uL (ref 4.0–10.5)

## 2016-03-08 LAB — PROTIME-INR
INR: 1.22 (ref 0.00–1.49)
Prothrombin Time: 15.5 seconds — ABNORMAL HIGH (ref 11.6–15.2)

## 2016-03-08 MED ORDER — PANTOPRAZOLE SODIUM 40 MG PO PACK
40.0000 mg | PACK | Freq: Two times a day (BID) | ORAL | Status: DC
  Administered 2016-03-08 – 2016-03-16 (×17): 40 mg
  Filled 2016-03-08 (×19): qty 20

## 2016-03-08 MED ORDER — LORAZEPAM 2 MG/ML IJ SOLN: 0.5000 mg | Freq: Once | INTRAMUSCULAR | Status: DC

## 2016-03-08 MED ORDER — FAMOTIDINE 40 MG/5ML PO SUSR
20.0000 mg | Freq: Two times a day (BID) | ORAL | Status: DC
  Administered 2016-03-08 – 2016-03-11 (×8): 20 mg
  Filled 2016-03-08 (×9): qty 2.5

## 2016-03-08 MED ORDER — LORAZEPAM 0.5 MG PO TABS: 0.5000 mg | ORAL_TABLET | Freq: Once | ORAL | Status: DC

## 2016-03-08 MED ORDER — SODIUM CHLORIDE 0.9 % IV SOLN
250.0000 mg | Freq: Two times a day (BID) | INTRAVENOUS | Status: DC
  Administered 2016-03-08 – 2016-03-10 (×4): 250 mg via INTRAVENOUS
  Filled 2016-03-08 (×5): qty 5

## 2016-03-08 MED ORDER — HYDROMORPHONE HCL 1 MG/ML IJ SOLN
0.5000 mg | Freq: Once | INTRAMUSCULAR | Status: DC
  Filled 2016-03-08: qty 1

## 2016-03-08 MED ORDER — LORAZEPAM 2 MG/ML IJ SOLN
0.5000 mg | Freq: Once | INTRAMUSCULAR | Status: AC
  Administered 2016-03-08: 0.5 mg via INTRAVENOUS
  Filled 2016-03-08: qty 1

## 2016-03-08 MED ORDER — METOPROLOL TARTRATE 25 MG/10 ML ORAL SUSPENSION
50.0000 mg | Freq: Two times a day (BID) | ORAL | Status: DC
  Administered 2016-03-08 (×2): 50 mg via ORAL
  Filled 2016-03-08 (×3): qty 20

## 2016-03-08 NOTE — Care Management Important Message (Signed)
Important Message  Patient Details  Name: [name] MRN: [id number] Date of Birth: 08/14/1934   Medicare Important Message Given:  Yes    Deran Barro Abena 03/08/2016, 10:48 AM

## 2016-03-08 NOTE — Progress Notes (Signed)
Physical Therapy Treatment Patient Details Name: [name] MRN: [id number] DOB: 28-Mar-1934 Today's Date: 03/08/2016    History of Present Illness [name] is a 82 y.o. female with medical history significant for but not limited to, atrial fibrillation, CKD, HTN, kidney cancer and gastric cancer. She is status post proximal gastrectomy early May for gastric adenocarcinoma. d/c home and readmitted with gastritis and received TNA. Went home and readmit 6/9 with N/V. 6/20 jejunostomy    PT Comments    Pt OOB in recliner with 2 very support female family members in room.  Stated they help pt walk several times a day.  Assisted out of recliner to amb with walker slightly increased distance.  Pt plans to return home.  Follow Up Recommendations  Home health PT;Supervision/Assistance - 24 hour (has very supportive family)     Equipment Recommendations       Recommendations for Other Services       Precautions / Restrictions Precautions Precautions: Fall Precaution Comments: J-tube Restrictions Weight Bearing Restrictions: No    Mobility  Bed Mobility               General bed mobility comments: Pt OOB in recliner  Transfers Overall transfer level: Needs assistance Equipment used: Rolling walker (2 wheeled) Transfers: Sit to/from Stand Sit to Stand: Min guard         General transfer comment: increased time and one VC safety with turn completion using walker prior to sit  Ambulation/Gait Ambulation/Gait assistance: Min assist;Min guard Ambulation Distance (Feet): 52 Feet Assistive device: Rolling walker (2 wheeled) Gait Pattern/deviations: Step-through pattern;Decreased stride length;Trunk flexed Gait velocity: decreased   General Gait Details: tolerated increased distance but still limited.  Poor forward flex posture.  No true LOB.   Stairs            Wheelchair Mobility    Modified Rankin (Stroke Patients Only)       Balance                                     Cognition Arousal/Alertness: Awake/alert Behavior During Therapy: WFL for tasks assessed/performed Overall Cognitive Status: Within Functional Limits for tasks assessed                      Exercises      General Comments        Pertinent Vitals/Pain Pain Assessment: Faces Faces Pain Scale: Hurts a little bit Pain Location: ABD Pain Descriptors / Indicators: Sore Pain Intervention(s): Monitored during session;Heat applied    Home Living                      Prior Function            PT Goals (current goals can now be found in the care plan section) Progress towards PT goals: Progressing toward goals    Frequency  Min 3X/week    PT Plan Current plan remains appropriate    Co-evaluation             End of Session Equipment Utilized During Treatment: Gait belt Activity Tolerance: Patient limited by fatigue Patient left: in chair;with call bell/phone within reach;with family/visitor present     Time: [id number] PT Time Calculation (min) (ACUTE ONLY): 14 min  Charges:  $Gait Training: 8-22 mins  G Codes:      Rica Koyanagi  PTA [facility]  Acute  Rehab Pager      [phone]

## 2016-03-08 NOTE — Progress Notes (Signed)
Patient ID: [name], female   DOB: [birth year], 82 y.o.   MRN: [id number] 13 Days Post-Op  Subjective: Pt continues to alternate between feeling OK and retching.  She feels like it "builds up."     Objective: Vital signs in last 24 hours: Temp:  [98.2 F (36.8 C)-99.2 F (37.3 C)] 98.5 F (36.9 C) (07/03 1209) Pulse Rate:  [75-113] 75 (07/03 1209) Resp:  [8-18] 8 (07/03 1209) BP: (97-145)/(52-77) 128/77 mmHg (07/03 1209) SpO2:  [97 %-99 %] 99 % (07/03 1209) Weight:  [63.821 kg (140 lb 11.2 oz)] 63.821 kg (140 lb 11.2 oz) (07/03 0510) Last BM Date: 03/07/16  Intake/Output from previous day: 07/02 0701 - 07/03 0700 In: 380 [P.O.:100; I.V.:280] Out: 550 [Urine:550] Intake/Output this shift: Total I/O In: 515 [P.O.:100; I.V.:80; NG/GT:280; IV Piggyback:55] Out: 554 [Urine:550; Stool:4]  Incision/Wound:small hematoma  No redness or drainage   Lab Results:   Recent Labs  03/07/16 0445 03/08/16 0514  WBC 5.9 6.0  HGB 8.2* 8.7*  HCT 27.8* 28.9*  PLT 228 238   BMET  Recent Labs  03/07/16 0445  NA 140  K 3.6  CL 112*  CO2 23  GLUCOSE 93  BUN 12  CREATININE 0.72  CALCIUM 8.2*   PT/INR  Recent Labs  03/07/16 0445 03/08/16 0514  LABPROT 16.2* 15.5*  INR 1.29 1.22   ABG No results for input(s): PHART, HCO3 in the last 72 hours.  Invalid input(s): PCO2, PO2  Studies/Results: No results found.  Anti-infectives: Anti-infectives    Start     Dose/Rate Route Frequency Ordered Stop   03/08/16 2200  erythromycin 250 mg in sodium chloride 0.9 % 100 mL IVPB     250 mg 100 mL/hr over 60 Minutes Intravenous Every 12 hours 03/08/16 1058     03/05/16 1600  erythromycin 250 mg in sodium chloride 0.9 % 100 mL IVPB  Status:  Discontinued     250 mg 100 mL/hr over 60 Minutes Intravenous Every 6 hours 03/05/16 1120 03/08/16 1058   03/04/16 2200  erythromycin 250 mg in sodium chloride 0.9 % 100 mL IVPB  Status:  Discontinued     250 mg 100 mL/hr over 60 Minutes  Intravenous Every 12 hours 03/04/16 0803 03/04/16 1306   03/04/16 1400  erythromycin 250 mg in sodium chloride 0.9 % 100 mL IVPB  Status:  Discontinued     250 mg 100 mL/hr over 60 Minutes Intravenous Every 8 hours 03/04/16 1306 03/05/16 1120   03/02/16 1530  erythromycin 250 mg in sodium chloride 0.9 % 100 mL IVPB  Status:  Discontinued     250 mg 100 mL/hr over 60 Minutes Intravenous Every 6 hours 03/02/16 1424 03/04/16 0803   02/29/16 1000  fluconazole (DIFLUCAN) IVPB 200 mg     200 mg 100 mL/hr over 60 Minutes Intravenous Every 24 hours 02/29/16 0847 03/02/16 1111   02/24/16 1000  ceFAZolin (ANCEF) IVPB 2g/100 mL premix    Comments:  On call to OR.   2 g 200 mL/hr over 30 Minutes Intravenous To ShortStay Surgical 02/24/16 0848 02/24/16 1215   02/18/16 1000  fluconazole (DIFLUCAN) IVPB 200 mg  Status:  Discontinued     200 mg 100 mL/hr over 60 Minutes Intravenous Every 24 hours 02/18/16 0917 02/29/16 0847   02/17/16 2200  erythromycin 250 mg in sodium chloride 0.9 % 100 mL IVPB  Status:  Discontinued     250 mg 100 mL/hr over 60 Minutes Intravenous Every 12 hours 02/17/16  1720 02/17/16 1853   02/17/16 2100  erythromycin 250 mg in sodium chloride 0.9 % 100 mL IVPB  Status:  Discontinued     250 mg 100 mL/hr over 60 Minutes Intravenous Every 12 hours 02/17/16 1853 02/20/16 0737      Assessment/Plan: s/p Procedure(s): OPEN J TUBE (Left) Hematoma wound stable Tube feeds at goal. Continue to work on medical management for nausea. Contrast moves through, so does not appear to be huge motility issue.     LOS: 18 days    [name] 03/08/2016

## 2016-03-08 NOTE — Progress Notes (Signed)
[facility] Progress Note  Patient: [name] [id number]   PCP: [name], MD DOB: Apr 01, 1934   DOA: 02/17/2016   DOS: 03/08/2016   Date of Service: the patient was seen and examined on 03/08/2016  Subjective: still with intermittent N/V, less burning now, tolerating Tfs  Brief hospital course: Pt. with PMH of A. fib, CKD,HTN, gastric cancer S/P proximal gastrectomy 01/2016; admitted on 02/17/2016, with complaint of vomiting, was found to have suspected esophagitis. Patient was given IV Diflucan, IV PPI and was started on TPN. Also started on IV erythromycin for motility. With initial treatment the patient was able to tolerate by mouth pills, but no food intake and therefore it was decided to place a J-tube, procedure done on 02/24/2016. Since the procedure pt has developed ileus which Appears to be improving. She also has anemia which appears to be dilutional and refusing blood transfusion. CT abdomen shows evidence of hematoma with any active bleeding. No other acute abnormality. Still with frequent nausea and vomiting  Assessment and Plan: 1. Gastric adenocarcinoma/Recent subtotal gastrectomy for gastric adenocarcinoma May 2017 -with Intractable nausea and vomiting -last EGD 6/1 with Grade D esophagitis and small non bleeding gastric ulcers at anastomoses -Tolerating TFs -gradually being advanced, goal of 45cc/hr, now at 40cc/hr -Weaned off TNA  -recurrent admissions with post op N/V -S/P J-tube placement 02/24/2016. -change IV protonix and pepcid to elixir-liq via J tube -also on Carafate, and GI cocktail -but unable to tolerate much of this -also completed 14days of Diflucan for thrush -Appreciate GI and general surgery consultation -Dr.Byerly following -will order clears as tol  2. Paroxysmal A. Fib/Pacemaker implant -Chadsvasc 5. On warfarin at home -Currently rate controlled. Continue Cardizem when necessary,  -still on metoprolol IV, change to  Elixir -anticoagulation stopped due to post op hematoma, resume warfarin today, d/w Dr.Byerly-although not sure will tolerate till tablet  3. Hypothyroidism. -on IV synthroid, may need to pulverize and dissolve and give in syringe  4. Postoperative blood loss anemia. -Surgical site hematoma, stable -No evidence of active bleeding and component of hemodilution -Patient refused blood transfusion,  -S/P IV Feraheme 5101, [place] Aranesp 1 as well as injection B-12 on 02/27/2016, after discussion with patient's hematology Dr Irene Limbo. -CT abdomen showed evidence of surgical site hematoma what appears to be stable at present. -off anticoagulation, ok to restart Warfarin d/w CCS  5. Dehydration. -Mild renal insufficiency earlier. -on TNA, resolved  6. Adult failure to thrive  DVT Prophylaxis: on prophylactic Lovenox.  Advance goals of care discussion: DNR/DNI Family Communication: Daughters at bedside Disposition: Discharge to home with home health when improved  Consultants: Gen. surgery Procedures: J-tube placement, PICC line placement, TPN  Antibiotics: Anti-infectives    Start     Dose/Rate Route Frequency Ordered Stop   03/05/16 1600  erythromycin 250 mg in sodium chloride 0.9 % 100 mL IVPB     250 mg 100 mL/hr over 60 Minutes Intravenous Every 6 hours 03/05/16 1120     03/04/16 2200  erythromycin 250 mg in sodium chloride 0.9 % 100 mL IVPB  Status:  Discontinued     250 mg 100 mL/hr over 60 Minutes Intravenous Every 12 hours 03/04/16 0803 03/04/16 1306   03/04/16 1400  erythromycin 250 mg in sodium chloride 0.9 % 100 mL IVPB  Status:  Discontinued     250 mg 100 mL/hr over 60 Minutes Intravenous Every 8 hours 03/04/16 1306 03/05/16 1120   03/02/16 1530  erythromycin 250 mg in sodium chloride 0.9 %  100 mL IVPB  Status:  Discontinued     250 mg 100 mL/hr over 60 Minutes Intravenous Every 6 hours 03/02/16 1424 03/04/16 0803   02/29/16 1000  fluconazole (DIFLUCAN) IVPB 200 mg      200 mg 100 mL/hr over 60 Minutes Intravenous Every 24 hours 02/29/16 0847 03/02/16 1111   02/24/16 1000  ceFAZolin (ANCEF) IVPB 2g/100 mL premix    Comments:  On call to OR.   2 g 200 mL/hr over 30 Minutes Intravenous To ShortStay Surgical 02/24/16 0848 02/24/16 1215   02/18/16 1000  fluconazole (DIFLUCAN) IVPB 200 mg  Status:  Discontinued     200 mg 100 mL/hr over 60 Minutes Intravenous Every 24 hours 02/18/16 0917 02/29/16 0847   02/17/16 2200  erythromycin 250 mg in sodium chloride 0.9 % 100 mL IVPB  Status:  Discontinued     250 mg 100 mL/hr over 60 Minutes Intravenous Every 12 hours 02/17/16 1720 02/17/16 1853   02/17/16 2100  erythromycin 250 mg in sodium chloride 0.9 % 100 mL IVPB  Status:  Discontinued     250 mg 100 mL/hr over 60 Minutes Intravenous Every 12 hours 02/17/16 1853 02/20/16 0737        Intake/Output Summary (Last 24 hours) at 03/08/16 1056 Last data filed at 03/08/16 0930  Gross per 24 hour  Intake 326.67 ml  Output    677 ml  Net -350.33 ml   Filed Weights   03/06/16 0447 03/07/16 0544 03/08/16 0510  Weight: 64.501 kg (142 lb 3.2 oz) 65.681 kg (144 lb 12.8 oz) 63.821 kg (140 lb 11.2 oz)    Objective: Physical Exam: Filed Vitals:   03/07/16 2338 03/08/16 0324 03/08/16 0510 03/08/16 0600  BP: 124/61 97/55  145/68  Pulse: 94 113  96  Temp: 99.2 F (37.3 C) 98.2 F (36.8 C)    TempSrc: Oral Oral    Resp: 18 18    Height:      Weight:   63.821 kg (140 lb 11.2 oz)   SpO2: 97% 98%      General: Alert, Awake, frail elderly woman, chronically ill appearing Eyes: PERRL, Conjunctiva normal ENT: Oral Mucosa clear moist. Cardiovascular: S1 and S2 Present, no Murmur, Respiratory:  Decreased at bases, rest clear Abdomen: Bowel Sound present, Soft and mild tenderness, surgical scar noted, J tube noted Skin: no redness, no Rash  Extremities: no Pedal edema, no calf tenderness Neurologic: Grossly no focal neuro deficit. Bilaterally Equal motor  strength  Data Reviewed: CBC:  Recent Labs Lab 03/03/16 0445 03/04/16 0505 03/05/16 0925 03/07/16 0445 03/08/16 0514  WBC 6.7 6.9 5.7 5.9 6.0  HGB 9.0* 9.0* 8.0* 8.2* 8.7*  HCT 28.3* 28.9* 30.2* 27.8* 28.9*  MCV 99.3 100.3* 144.5* 102.6* 101.4*  PLT 249 251 201 228 [id number]   Basic Metabolic Panel:  Recent Labs Lab 03/02/16 0515 03/03/16 0445 03/04/16 0505 03/05/16 0925 03/07/16 0445  NA 136 134* 137  --  140  K 3.9 4.2 3.8  --  3.6  CL 109 105 109  --  112*  CO2 22 21* 20*  --  23  GLUCOSE 114* 295* 96  --  93  BUN 17 17 19   --  12  CREATININE 0.70 0.77 0.71  --  0.72  CALCIUM 8.5* 8.6* 8.5*  --  8.2*  MG 1.9 1.8 1.8 2.4  --   PHOS  --   --  4.7*  --   --     Liver Function  Tests:  Recent Labs Lab 03/04/16 0505  AST 33  ALT 35  ALKPHOS 107  BILITOT 0.7  PROT 5.5*  ALBUMIN 2.3*   No results for input(s): LIPASE, AMYLASE in the last 168 hours. No results for input(s): AMMONIA in the last 168 hours. Coagulation Profile:  Recent Labs Lab 03/04/16 0505 03/05/16 0925 03/06/16 1040 03/07/16 0445 03/08/16 0514  INR 1.23 1.38 1.30 1.29 1.22   Cardiac Enzymes: No results for input(s): CKTOTAL, CKMB, CKMBINDEX, TROPONINI in the last 168 hours. BNP (last 3 results) No results for input(s): PROBNP in the last 8760 hours.  CBG:  Recent Labs Lab 03/07/16 1624 03/07/16 1923 03/08/16 0014 03/08/16 0329 03/08/16 0716  GLUCAP 109* 107* 117* 118* 121*    Studies: No results found.   Scheduled Meds: . antiseptic oral rinse  7 mL Mouth Rinse BID  . enoxaparin (LOVENOX) injection  40 mg Subcutaneous Q24H  . erythromycin  250 mg Intravenous Q6H  . famotidine  20 mg Per Tube BID  . feeding supplement (VITAL 1.5 CAL)  1,000 mL Per Tube Q24H  . gi cocktail  30 mL Oral TID  . insulin aspart  0-9 Units Subcutaneous Q4H  . levothyroxine  25 mcg Intravenous Daily  . lidocaine  1 patch Transdermal Q24H  . metoprolol tartrate  50 mg Oral BID  . pantoprazole  sodium  40 mg Per Tube BID  . pregabalin  75 mg Oral BID  . sucralfate  1 g Oral TID WC & HS   Continuous Infusions: . sodium chloride 10 mL/hr at 03/05/16 0934   PRN Meds: acetaminophen **OR** acetaminophen, alum & mag hydroxide-simeth, calcium carbonate, diltiazem, methocarbamol (ROBAXIN)  IV, ondansetron **OR** ondansetron (ZOFRAN) IV, ondansetron, promethazine, sodium chloride flush  Time spent: 30 minutes  Author: Domenic Polite, MD [facility] Pager: [phone]  03/08/2016 10:56 AM  If 7PM-7AM, please contact night-coverage at [email or web address], password [facility]

## 2016-03-08 NOTE — Progress Notes (Signed)
Patient complained of increased abdominal pain approximately 30 minutes after increasing tube feedings from 40 ml/hr to goal rate, 45 ml/hr. Robaxin and K-pad given with no relief. Feedings decreased back to 40 ml/hr. MD notified of increased pain. Dilaudid, one time dose, ordered.   Currently patient states pain has decreased significantly and that she doesn't need the dilaudid dose at this time. Will continue to monitor.

## 2016-03-09 LAB — BASIC METABOLIC PANEL WITH GFR
Anion gap: 6 (ref 5–15)
BUN: 10 mg/dL (ref 6–20)
CO2: 24 mmol/L (ref 22–32)
Calcium: 8.3 mg/dL — ABNORMAL LOW (ref 8.9–10.3)
Chloride: 113 mmol/L — ABNORMAL HIGH (ref 101–111)
Creatinine, Ser: 0.69 mg/dL (ref 0.44–1.00)
GFR calc Af Amer: >60 mL/min
GFR calc non Af Amer: >60 mL/min
Glucose, Bld: 104 mg/dL — ABNORMAL HIGH (ref 65–99)
Potassium: 3 mmol/L — ABNORMAL LOW (ref 3.5–5.1)
Sodium: 143 mmol/L (ref 135–145)

## 2016-03-09 LAB — GLUCOSE, CAPILLARY
GLUCOSE-CAPILLARY: 111 mg/dL — AB (ref 65–99)
GLUCOSE-CAPILLARY: 108 mg/dL — AB (ref 65–99)
Glucose-Capillary: 137 mg/dL — ABNORMAL HIGH (ref 65–99)
Glucose-Capillary: 128 mg/dL — ABNORMAL HIGH (ref 65–99)
Glucose-Capillary: 129 mg/dL — ABNORMAL HIGH (ref 65–99)

## 2016-03-09 LAB — CBC
HEMATOCRIT: 32 % — AB (ref 36.0–46.0)
HEMOGLOBIN: 9.6 g/dL — AB (ref 12.0–15.0)
MCH: 30.7 pg (ref 26.0–34.0)
MCHC: 30 g/dL (ref 30.0–36.0)
MCV: 102.2 fL — AB (ref 78.0–100.0)
Platelets: 199 10*3/uL (ref 150–400)
RBC: 3.13 MIL/uL — AB (ref 3.87–5.11)
RDW: 19.3 % — ABNORMAL HIGH (ref 11.5–15.5)
WBC: 4.8 10*3/uL (ref 4.0–10.5)

## 2016-03-09 MED ORDER — FUROSEMIDE 10 MG/ML IJ SOLN
20.0000 mg | Freq: Two times a day (BID) | INTRAMUSCULAR | Status: DC
  Administered 2016-03-10: 20 mg via INTRAVENOUS
  Filled 2016-03-09 (×2): qty 2

## 2016-03-09 MED ORDER — METOPROLOL TARTRATE 25 MG/10 ML ORAL SUSPENSION
50.0000 mg | Freq: Two times a day (BID) | ORAL | Status: DC
  Administered 2016-03-09 – 2016-03-14 (×11): 50 mg
  Filled 2016-03-09 (×11): qty 20

## 2016-03-09 MED ORDER — POTASSIUM CHLORIDE 20 MEQ/15ML (10%) PO SOLN: 40.0000 meq | Freq: Once | ORAL | Status: DC

## 2016-03-09 MED ORDER — LORAZEPAM 2 MG/ML IJ SOLN
0.5000 mg | Freq: Every evening | INTRAMUSCULAR | Status: DC | PRN
  Administered 2016-03-09 – 2016-03-15 (×7): 0.5 mg via INTRAVENOUS
  Filled 2016-03-09 (×7): qty 1

## 2016-03-09 MED ORDER — FUROSEMIDE 10 MG/ML IJ SOLN
20.0000 mg | Freq: Two times a day (BID) | INTRAMUSCULAR | Status: DC
  Administered 2016-03-09: 20 mg via INTRAVENOUS
  Filled 2016-03-09: qty 2

## 2016-03-09 MED ORDER — POTASSIUM CHLORIDE 20 MEQ/15ML (10%) PO SOLN
40.0000 meq | Freq: Once | ORAL | Status: AC
  Administered 2016-03-09: 40 meq via ORAL
  Filled 2016-03-09: qty 30

## 2016-03-09 MED ORDER — POTASSIUM CHLORIDE 10 MEQ/100ML IV SOLN
10.0000 meq | INTRAVENOUS | Status: AC
  Administered 2016-03-09 (×4): 10 meq via INTRAVENOUS
  Filled 2016-03-09 (×4): qty 100

## 2016-03-09 MED ORDER — VITAL 1.5 CAL PO LIQD
1000.0000 mL | ORAL | Status: DC
  Administered 2016-03-09: 1000 mL
  Filled 2016-03-09 (×3): qty 1000

## 2016-03-09 NOTE — Progress Notes (Addendum)
[facility] Progress Note  Patient: [name] [id number]   PCP: [name], MD DOB: October 17, 1933   DOA: 02/17/2016   DOS: 03/09/2016   Date of Service: the patient was seen and examined on 03/09/2016  Subjective: still with intermittent N/V, less burning now, tolerating Tfs  Brief hospital course: Pt. with PMH of A. fib, CKD,HTN, gastric cancer S/P proximal gastrectomy 01/2016; admitted on 02/17/2016, with complaint of vomiting, was found to have suspected esophagitis. Patient was given IV Diflucan, IV PPI and was started on TPN. Also started on IV erythromycin for motility. With initial treatment the patient was able to tolerate by mouth pills, but no food intake and therefore it was decided to place a J-tube, procedure done on 02/24/2016. Since the procedure pt has developed ileus which Appears to be improving. She also has anemia which appears to be dilutional and refusing blood transfusion. CT abdomen shows evidence of hematoma with any active bleeding. No other acute abnormality. Still with frequent nausea and vomiting Tolerating Tube feeds via J tube 7/4: [facility] reconsulted  Assessment and Plan: 1. Gastric adenocarcinoma/Recent subtotal gastrectomy for gastric adenocarcinoma May 2017 -with Intractable nausea and vomiting since mid May -last EGD 6/1 Dr.Stark with Grade D esophagitis and small non bleeding gastric ulcers at anastomoses -Tolerating TFs -gradually being advanced, goal of 45cc/hr, now at 40cc/hr -Weaned off TNA  -recurrent admissions with post op N/V -S/P J-tube placement 02/24/2016. -Has Been on IV PPI, Pepcid, IV erythromycin without any improvement, also completed 2 weeks of IV FLuconazole for Oral thrush. -Now on Liquid Protonix and Pepcid elixir via J tube -also on Carafate, and GI cocktail -but unable to tolerate any of this without vomiting it up -also completed 14days of Diflucan for thrush -Dr.Byerly following -Seen by [facility] Dr.Nandigam  this admission>1 week ago, however due to lack of improvement with maximal medical therapy, i have asked [facility] to re-consult, they will FU in am  2. Paroxysmal A. Fib/Pacemaker implant -Chadsvasc 5. On warfarin at home -Currently rate controlled. Continue Cardizem when necessary,  -On liquid metoprolol via J tube -anticoagulation stopped due to post op hematoma, d/w Dr.Byerly (7/3) ok to resume warfarin however she is unable to tolerate anything PO  3. Hypothyroidism. -on IV synthroid, may need to pulverize and dissolve and give in syringe, at discharge  4. Postoperative blood loss anemia. -Surgical site hematoma, stable -No evidence of active bleeding and component of hemodilution -Patient refused blood transfusion,  -S/P IV Feraheme 5101, [place] Aranesp 1 as well as injection B-12 on 02/27/2016, after discussion with patient's hematology Dr Irene Limbo. -CT abdomen showed evidence of small surgical site hematoma what appears to be stable at present. -off anticoagulation, ok to restart Warfarin d/w CCS, but unable to take PO   5. Dehydration. -Mild renal insufficiency earlier. -on TNA, resolved  6. Adult failure to thrive  7. Mild Volume overload -Iatrogenic, from TNA, IV lasix x2 doses -last ECHo in 3/17 with normal EF, mild to mod AS  DVT Prophylaxis: on prophylactic Lovenox.  Advance goals of care discussion: DNR/DNI Family Communication: Daughters at bedside Disposition: Discharge to home with home health when improved  Consultants: Gen. surgery Procedures: J-tube placement, PICC line placement, TPN  Antibiotics: Anti-infectives    Start     Dose/Rate Route Frequency Ordered Stop   03/08/16 2200  erythromycin 250 mg in sodium chloride 0.9 % 100 mL IVPB     250 mg 100 mL/hr over 60 Minutes Intravenous Every 12 hours  03/08/16 1058     03/05/16 1600  erythromycin 250 mg in sodium chloride 0.9 % 100 mL IVPB  Status:  Discontinued     250 mg 100 mL/hr over 60 Minutes  Intravenous Every 6 hours 03/05/16 1120 03/08/16 1058   03/04/16 2200  erythromycin 250 mg in sodium chloride 0.9 % 100 mL IVPB  Status:  Discontinued     250 mg 100 mL/hr over 60 Minutes Intravenous Every 12 hours 03/04/16 0803 03/04/16 1306   03/04/16 1400  erythromycin 250 mg in sodium chloride 0.9 % 100 mL IVPB  Status:  Discontinued     250 mg 100 mL/hr over 60 Minutes Intravenous Every 8 hours 03/04/16 1306 03/05/16 1120   03/02/16 1530  erythromycin 250 mg in sodium chloride 0.9 % 100 mL IVPB  Status:  Discontinued     250 mg 100 mL/hr over 60 Minutes Intravenous Every 6 hours 03/02/16 1424 03/04/16 0803   02/29/16 1000  fluconazole (DIFLUCAN) IVPB 200 mg     200 mg 100 mL/hr over 60 Minutes Intravenous Every 24 hours 02/29/16 0847 03/02/16 1111   02/24/16 1000  ceFAZolin (ANCEF) IVPB 2g/100 mL premix    Comments:  On call to OR.   2 g 200 mL/hr over 30 Minutes Intravenous To ShortStay Surgical 02/24/16 0848 02/24/16 1215   02/18/16 1000  fluconazole (DIFLUCAN) IVPB 200 mg  Status:  Discontinued     200 mg 100 mL/hr over 60 Minutes Intravenous Every 24 hours 02/18/16 0917 02/29/16 0847   02/17/16 2200  erythromycin 250 mg in sodium chloride 0.9 % 100 mL IVPB  Status:  Discontinued     250 mg 100 mL/hr over 60 Minutes Intravenous Every 12 hours 02/17/16 1720 02/17/16 1853   02/17/16 2100  erythromycin 250 mg in sodium chloride 0.9 % 100 mL IVPB  Status:  Discontinued     250 mg 100 mL/hr over 60 Minutes Intravenous Every 12 hours 02/17/16 1853 02/20/16 0737        Intake/Output Summary (Last 24 hours) at 03/09/16 1231 Last data filed at 03/09/16 1100  Gross per 24 hour  Intake    675 ml  Output    737 ml  Net    -62 ml   Filed Weights   03/07/16 0544 03/08/16 0510 03/09/16 0623  Weight: 65.681 kg (144 lb 12.8 oz) 63.821 kg (140 lb 11.2 oz) 64.91 kg (143 lb 1.6 oz)    Objective: Physical Exam: Filed Vitals:   03/08/16 0600 03/08/16 1209 03/08/16 1936 03/09/16 0623   BP: 145/68 128/77 144/69 165/76  Pulse: 96 75 103 88  Temp:  98.5 F (36.9 C) 98.6 F (37 C) 98.8 F (37.1 C)  TempSrc:  Oral Oral Oral  Resp:  8 18 18   Height:      Weight:    64.91 kg (143 lb 1.6 oz)  SpO2:  99% 98% 98%    General: Alert, Awake, frail elderly woman, chronically ill appearing Eyes: PERRL, Conjunctiva normal ENT: Oral Mucosa clear moist. Cardiovascular: S1 and S2 Present, systolic murmur Respiratory:  Fine basilar crackles Abdomen: Bowel Sound present, Soft and mild tenderness, surgical scar noted, J tube noted Skin: no redness, no Rash  Extremities: no Pedal edema, no calf tenderness Neurologic: Grossly no focal neuro deficit. Bilaterally Equal motor strength  Data Reviewed: CBC:  Recent Labs Lab 03/04/16 0505 03/05/16 0925 03/07/16 0445 03/08/16 0514 03/09/16 0420  WBC 6.9 5.7 5.9 6.0 4.8  HGB 9.0* 8.0* 8.2* 8.7* 9.6*  HCT 28.9* 30.2* 27.8* 28.9* 32.0*  MCV 100.3* 144.5* 102.6* 101.4* 102.2*  PLT 251 201 228 238 [id number]   Basic Metabolic Panel:  Recent Labs Lab 03/03/16 0445 03/04/16 0505 03/05/16 0925 03/07/16 0445 03/09/16 0420  NA 134* 137  --  140 143  K 4.2 3.8  --  3.6 3.0*  CL 105 109  --  112* 113*  CO2 21* 20*  --  23 24  GLUCOSE 295* 96  --  93 104*  BUN 17 19  --  12 10  CREATININE 0.77 0.71  --  0.72 0.69  CALCIUM 8.6* 8.5*  --  8.2* 8.3*  MG 1.8 1.8 2.4  --   --   PHOS  --  4.7*  --   --   --     Liver Function Tests:  Recent Labs Lab 03/04/16 0505  AST 33  ALT 35  ALKPHOS 107  BILITOT 0.7  PROT 5.5*  ALBUMIN 2.3*   No results for input(s): LIPASE, AMYLASE in the last 168 hours. No results for input(s): AMMONIA in the last 168 hours. Coagulation Profile:  Recent Labs Lab 03/04/16 0505 03/05/16 0925 03/06/16 1040 03/07/16 0445 03/08/16 0514  INR 1.23 1.38 1.30 1.29 1.22   Cardiac Enzymes: No results for input(s): CKTOTAL, CKMB, CKMBINDEX, TROPONINI in the last 168 hours. BNP (last 3 results) No  results for input(s): PROBNP in the last 8760 hours.  CBG:  Recent Labs Lab 03/08/16 1932 03/08/16 2357 03/09/16 0411 03/09/16 0739 03/09/16 1209  GLUCAP 128* 108* 111* 137* 128*    Studies: No results found.   Scheduled Meds: . antiseptic oral rinse  7 mL Mouth Rinse BID  . enoxaparin (LOVENOX) injection  40 mg Subcutaneous Q24H  . erythromycin  250 mg Intravenous Q12H  . famotidine  20 mg Per Tube BID  . feeding supplement (VITAL 1.5 CAL)  1,000 mL Per Tube Q24H  . gi cocktail  30 mL Oral TID  .  HYDROmorphone (DILAUDID) injection  0.5 mg Intravenous Once  . insulin aspart  0-9 Units Subcutaneous Q4H  . levothyroxine  25 mcg Intravenous Daily  . lidocaine  1 patch Transdermal Q24H  . metoprolol tartrate  50 mg Per Tube BID  . pantoprazole sodium  40 mg Per Tube BID  . potassium chloride  10 mEq Intravenous Q1 Hr x 4  . pregabalin  75 mg Oral BID  . sucralfate  1 g Oral TID WC & HS   Continuous Infusions: . sodium chloride 10 mL/hr at 03/05/16 0934   PRN Meds: acetaminophen **OR** acetaminophen, alum & mag hydroxide-simeth, calcium carbonate, diltiazem, methocarbamol (ROBAXIN)  IV, ondansetron **OR** ondansetron (ZOFRAN) IV, ondansetron, promethazine, sodium chloride flush  Time spent: 30 minutes  Author: Domenic Polite, MD [facility] Pager: [phone]  03/09/2016 12:31 PM  If 7PM-7AM, please contact night-coverage at [email or web address], password [facility]

## 2016-03-09 NOTE — Progress Notes (Signed)
PT Cancellation Note  Patient Details Name: [name] MRN: [id number] DOB: October 12, 1933   Cancelled Treatment:    Reason Eval/Treat Not Completed: Medical issues which prohibited therapy; reports sick most of the morning.  Will attempt later as time permits.   Reginia Naas 03/09/2016, 12:16 PM  Magda Kiel, [place] 03/09/2016

## 2016-03-09 NOTE — Progress Notes (Signed)
Progress Note: General Surgery Service   Subjective: Continued nausea, vomited 3-4 times yesterday  Objective: Vital signs in last 24 hours: Temp:  [98.5 F (36.9 C)-98.8 F (37.1 C)] 98.8 F (37.1 C) (07/04 0623) Pulse Rate:  [75-103] 88 (07/04 0623) Resp:  [8-18] 18 (07/04 0623) BP: (128-165)/(69-77) 165/76 mmHg (07/04 0623) SpO2:  [98 %-99 %] 98 % (07/04 0623) Weight:  [64.91 kg (143 lb 1.6 oz)] 64.91 kg (143 lb 1.6 oz) (07/04 0623) Last BM Date: 03/08/16  Intake/Output from previous day: 07/03 0701 - 07/04 0700 In: 725 [P.O.:200; I.V.:90; NG/GT:280; IV Piggyback:55] Out: [id number] [Urine:1175; Stool:4] Intake/Output this shift: Total I/O In: 10 [P.O.:10] Out: 11 [Urine:10; Stool:1]  Lungs: CTAB  Cardiovascular: RRR  Abd: soft, NT, ND, incisions c/d/i  Extremities: no edema  Neuro: AOx4  Lab Results: CBC   Recent Labs  03/08/16 0514 03/09/16 0420  WBC 6.0 4.8  HGB 8.7* 9.6*  HCT 28.9* 32.0*  PLT 238 199   BMET  Recent Labs  03/07/16 0445 03/09/16 0420  NA 140 143  K 3.6 3.0*  CL 112* 113*  CO2 23 24  GLUCOSE 93 104*  BUN 12 10  CREATININE 0.72 0.69  CALCIUM 8.2* 8.3*   PT/INR  Recent Labs  03/07/16 0445 03/08/16 0514  LABPROT 16.2* 15.5*  INR 1.29 1.22   ABG No results for input(s): PHART, HCO3 in the last 72 hours.  Invalid input(s): PCO2, PO2  Studies/Results:  Anti-infectives: Anti-infectives    Start     Dose/Rate Route Frequency Ordered Stop   03/08/16 2200  erythromycin 250 mg in sodium chloride 0.9 % 100 mL IVPB     250 mg 100 mL/hr over 60 Minutes Intravenous Every 12 hours 03/08/16 1058     03/05/16 1600  erythromycin 250 mg in sodium chloride 0.9 % 100 mL IVPB  Status:  Discontinued     250 mg 100 mL/hr over 60 Minutes Intravenous Every 6 hours 03/05/16 1120 03/08/16 1058   03/04/16 2200  erythromycin 250 mg in sodium chloride 0.9 % 100 mL IVPB  Status:  Discontinued     250 mg 100 mL/hr over 60 Minutes Intravenous  Every 12 hours 03/04/16 0803 03/04/16 1306   03/04/16 1400  erythromycin 250 mg in sodium chloride 0.9 % 100 mL IVPB  Status:  Discontinued     250 mg 100 mL/hr over 60 Minutes Intravenous Every 8 hours 03/04/16 1306 03/05/16 1120   03/02/16 1530  erythromycin 250 mg in sodium chloride 0.9 % 100 mL IVPB  Status:  Discontinued     250 mg 100 mL/hr over 60 Minutes Intravenous Every 6 hours 03/02/16 1424 03/04/16 0803   02/29/16 1000  fluconazole (DIFLUCAN) IVPB 200 mg     200 mg 100 mL/hr over 60 Minutes Intravenous Every 24 hours 02/29/16 0847 03/02/16 1111   02/24/16 1000  ceFAZolin (ANCEF) IVPB 2g/100 mL premix    Comments:  On call to OR.   2 g 200 mL/hr over 30 Minutes Intravenous To ShortStay Surgical 02/24/16 0848 02/24/16 1215   02/18/16 1000  fluconazole (DIFLUCAN) IVPB 200 mg  Status:  Discontinued     200 mg 100 mL/hr over 60 Minutes Intravenous Every 24 hours 02/18/16 0917 02/29/16 0847   02/17/16 2200  erythromycin 250 mg in sodium chloride 0.9 % 100 mL IVPB  Status:  Discontinued     250 mg 100 mL/hr over 60 Minutes Intravenous Every 12 hours 02/17/16 1720 02/17/16 1853   02/17/16 2100  erythromycin 250 mg in sodium chloride 0.9 % 100 mL IVPB  Status:  Discontinued     250 mg 100 mL/hr over 60 Minutes Intravenous Every 12 hours 02/17/16 1853 02/20/16 0737      Medications: Scheduled Meds: . antiseptic oral rinse  7 mL Mouth Rinse BID  . enoxaparin (LOVENOX) injection  40 mg Subcutaneous Q24H  . erythromycin  250 mg Intravenous Q12H  . famotidine  20 mg Per Tube BID  . feeding supplement (VITAL 1.5 CAL)  1,000 mL Per Tube Q24H  . gi cocktail  30 mL Oral TID  .  HYDROmorphone (DILAUDID) injection  0.5 mg Intravenous Once  . insulin aspart  0-9 Units Subcutaneous Q4H  . levothyroxine  25 mcg Intravenous Daily  . lidocaine  1 patch Transdermal Q24H  . metoprolol tartrate  50 mg Per Tube BID  . pantoprazole sodium  40 mg Per Tube BID  . potassium chloride  10 mEq  Intravenous Q1 Hr x 4  . pregabalin  75 mg Oral BID  . sucralfate  1 g Oral TID WC & HS   Continuous Infusions: . sodium chloride 10 mL/hr at 03/05/16 0934   PRN Meds:.acetaminophen **OR** acetaminophen, alum & mag hydroxide-simeth, calcium carbonate, diltiazem, methocarbamol (ROBAXIN)  IV, ondansetron **OR** ondansetron (ZOFRAN) IV, ondansetron, promethazine, sodium chloride flush  Assessment/Plan: Patient Active Problem List   Diagnosis Date Noted  . Surgical site Hematoma 03/02/2016  . No blood products 02/27/2016  . Nausea and vomiting 02/19/2016  . AKI (acute kidney injury) ([place]) 02/17/2016  . S/P partial gastrectomy 02/17/2016  . Nausea & vomiting 02/17/2016  . Post gastrectomy syndrome   . Erosive esophagitis   . Uncontrollable vomiting   . Urinary tract infectious disease   . Gastroesophageal reflux disease with esophagitis   . Esophagitis   . Pressure ulcer 02/04/2016  . Hypernatremia 02/04/2016  . Hypokalemia 02/04/2016  . Dehydration 02/04/2016  . Intractable nausea and vomiting 02/03/2016  . Gastric cancer ([place]) 01/15/2016  . Persistent atrial fibrillation ([place]) 01/15/2016  . Protein-calorie malnutrition, moderate ([place]) 12/08/2015  . B12 deficiency 11/24/2015  . Iron deficiency anemia due to chronic blood loss 11/24/2015  . Refusal of blood transfusions as patient is Jehovah's Witness   . Adenocarcinoma of stomach ([place]) 11/11/2015  . CKD (chronic kidney disease), stage III 11/10/2015  . Vitamin B12 deficiency 11/09/2015  . Right knee pain 11/08/2015  . Peripheral nerve disease ([place]) 07/18/2015  . Long term current use of anticoagulant therapy 09/25/2014  . Rupture patellar tendon 07/29/2014  . Essential hypertension 06/04/2014  . Cardiac pacemaker (06/2010) 08/26/2012  . S/P right TKA 02/07/2012  . FHx/o Colon Cancer 07/27/2011  . Aortic stenosis, moderate 07/09/2011  . S/P repair of patent ductus arteriosus 04/22/2011  . Atrial fibrillation ([place]) Chadsvasc  5 12/02/2010  . Renal Cell Cancer ( 2003) 11/17/2007  . Hypothyroidism 11/17/2007  . Gastroparesis 11/17/2007   s/p Procedure(s): OPEN J TUBE 02/24/2016 -continues to have nauseated issues, tolerating tube feeds well -she appears to have a trial of just about every motility and antiemetic drug and at this time instead of moving things around she would like to try less things -continue tube feeds   LOS: 19 days   Mickeal Skinner, MD Pg# [phone] [facility]

## 2016-03-09 NOTE — Progress Notes (Signed)
Physical Therapy Treatment Patient Details Name: [name] MRN: [id number] DOB: January 04, 1934 Today's Date: 03/09/2016    History of Present Illness [name] is a 82 y.o. female with medical history significant for but not limited to, atrial fibrillation, CKD, HTN, kidney cancer and gastric cancer. She is status post proximal gastrectomy early May for gastric adenocarcinoma. d/c home and readmitted with gastritis and received TNA. Went home and readmit 6/9 with N/V. 6/20 jejunostomy    PT Comments    Patient progressing past gait goal, will update goal.  Feel she is appropriate for HHPT with family support at d/c.  Continue skilled PT during acute stay.   Follow Up Recommendations  Home health PT;Supervision/Assistance - 24 hour     Equipment Recommendations  None recommended by PT    Recommendations for Other Services       Precautions / Restrictions Precautions Precautions: Fall Precaution Comments: J-tube    Mobility  Bed Mobility               General bed mobility comments: Pt OOB in recliner  Transfers Overall transfer level: Needs assistance Equipment used: Rolling walker (2 wheeled) Transfers: Sit to/from Stand Sit to Stand: Min guard         General transfer comment: assist for lines and safety  Ambulation/Gait Ambulation/Gait assistance: Min assist Ambulation Distance (Feet): 180 Feet Assistive device: Rolling walker (2 wheeled) Gait Pattern/deviations: Step-through pattern;Decreased stride length;Trunk flexed     General Gait Details: mildly flexed posture, scuffing feet on floor, daughter present and assisting as needed   Stairs            Wheelchair Mobility    Modified Rankin (Stroke Patients Only)       Balance Overall balance assessment: Needs assistance   Sitting balance-Leahy Scale: Fair     Standing balance support: Bilateral upper extremity supported Standing balance-Leahy Scale: Poor Standing balance  comment: UE support for balance                    Cognition Arousal/Alertness: Awake/alert Behavior During Therapy: WFL for tasks assessed/performed Overall Cognitive Status: Within Functional Limits for tasks assessed                      Exercises General Exercises - Upper Extremity Chair Push Up: Strengthening;Both;Other (comment) (8) General Exercises - Lower Extremity Ankle Circles/Pumps: AROM;Both;15 reps Long Arc Quad: AROM;Both;Seated;15 reps Hip Flexion/Marching: 10 reps;15 reps;AROM;Both;Seated    General Comments        Pertinent Vitals/Pain Faces Pain Scale: Hurts a little bit Pain Location: abdomen Pain Descriptors / Indicators: Sore Pain Intervention(s): Monitored during session;Repositioned    Home Living                      Prior Function            PT Goals (current goals can now be found in the care plan section) Progress towards PT goals: Progressing toward goals    Frequency  Min 3X/week    PT Plan Current plan remains appropriate    Co-evaluation             End of Session   Activity Tolerance: Patient tolerated treatment well Patient left: in chair;with call bell/phone within reach;with family/visitor present     Time: 1450-1507 PT Time Calculation (min) (ACUTE ONLY): 17 min  Charges:  $Gait Training: 8-22 mins  G CodesReginia Naas 03/09/2016, 3:14 PM  Magda Kiel, [place] 03/09/2016

## 2016-03-10 DIAGNOSIS — I350 Nonrheumatic aortic (valve) stenosis: Secondary | ICD-10-CM

## 2016-03-10 DIAGNOSIS — N179 Acute kidney failure, unspecified: Secondary | ICD-10-CM

## 2016-03-10 DIAGNOSIS — R633 Feeding difficulties: Secondary | ICD-10-CM

## 2016-03-10 DIAGNOSIS — I10 Essential (primary) hypertension: Secondary | ICD-10-CM

## 2016-03-10 DIAGNOSIS — I480 Paroxysmal atrial fibrillation: Secondary | ICD-10-CM

## 2016-03-10 DIAGNOSIS — Z789 Other specified health status: Secondary | ICD-10-CM

## 2016-03-10 DIAGNOSIS — T148 Other injury of unspecified body region: Secondary | ICD-10-CM

## 2016-03-10 DIAGNOSIS — E860 Dehydration: Secondary | ICD-10-CM

## 2016-03-10 DIAGNOSIS — R112 Nausea with vomiting, unspecified: Secondary | ICD-10-CM

## 2016-03-10 LAB — BASIC METABOLIC PANEL
ANION GAP: 7 (ref 5–15)
BUN: 9 mg/dL (ref 6–20)
CALCIUM: 8.4 mg/dL — AB (ref 8.9–10.3)
CHLORIDE: 111 mmol/L (ref 101–111)
CO2: 26 mmol/L (ref 22–32)
CREATININE: 0.66 mg/dL (ref 0.44–1.00)
Glucose, Bld: 114 mg/dL — ABNORMAL HIGH (ref 65–99)
Potassium: 3.3 mmol/L — ABNORMAL LOW (ref 3.5–5.1)
SODIUM: 144 mmol/L (ref 135–145)

## 2016-03-10 LAB — CBC
HCT: 32 % — ABNORMAL LOW (ref 36.0–46.0)
HEMOGLOBIN: 9.6 g/dL — AB (ref 12.0–15.0)
MCH: 31.1 pg (ref 26.0–34.0)
MCHC: 30 g/dL (ref 30.0–36.0)
MCV: 103.6 fL — ABNORMAL HIGH (ref 78.0–100.0)
Platelets: 268 10*3/uL (ref 150–400)
RBC: 3.09 MIL/uL — AB (ref 3.87–5.11)
RDW: 19.3 % — ABNORMAL HIGH (ref 11.5–15.5)
WBC: 5.2 10*3/uL (ref 4.0–10.5)

## 2016-03-10 LAB — GLUCOSE, CAPILLARY
GLUCOSE-CAPILLARY: 107 mg/dL — AB (ref 65–99)
GLUCOSE-CAPILLARY: 116 mg/dL — AB (ref 65–99)
GLUCOSE-CAPILLARY: 119 mg/dL — AB (ref 65–99)
GLUCOSE-CAPILLARY: 92 mg/dL (ref 65–99)
Glucose-Capillary: 127 mg/dL — ABNORMAL HIGH (ref 65–99)
Glucose-Capillary: 130 mg/dL — ABNORMAL HIGH (ref 65–99)
Glucose-Capillary: 125 mg/dL — ABNORMAL HIGH (ref 65–99)

## 2016-03-10 MED ORDER — PROMETHAZINE HCL 25 MG/ML IJ SOLN: 6.2500 mg | Freq: Four times a day (QID) | INTRAMUSCULAR | Status: DC

## 2016-03-10 MED ORDER — ONDANSETRON HCL 4 MG/2ML IJ SOLN
4.0000 mg | Freq: Four times a day (QID) | INTRAMUSCULAR | Status: DC
  Administered 2016-03-10 – 2016-03-12 (×5): 4 mg via INTRAVENOUS
  Filled 2016-03-10 (×5): qty 2

## 2016-03-10 MED ORDER — DILTIAZEM 12 MG/ML ORAL SUSPENSION
60.0000 mg | Freq: Four times a day (QID) | ORAL | Status: DC
  Administered 2016-03-10 – 2016-03-14 (×16): 60 mg
  Filled 2016-03-10 (×20): qty 6

## 2016-03-10 MED ORDER — LIDOCAINE 5 % EX PTCH
1.0000 | MEDICATED_PATCH | CUTANEOUS | Status: DC
  Administered 2016-03-10 – 2016-03-13 (×4): 1 via TRANSDERMAL
  Filled 2016-03-10 (×6): qty 1

## 2016-03-10 MED ORDER — VITAL 1.5 CAL PO LIQD
1000.0000 mL | ORAL | Status: DC
  Administered 2016-03-10: 1000 mL
  Filled 2016-03-10 (×4): qty 1000

## 2016-03-10 MED ORDER — POTASSIUM CHLORIDE 20 MEQ/15ML (10%) PO SOLN
40.0000 meq | Freq: Four times a day (QID) | ORAL | Status: AC
  Administered 2016-03-10 (×2): 40 meq via ORAL
  Filled 2016-03-10 (×2): qty 30

## 2016-03-10 MED ORDER — DRONABINOL 2.5 MG PO CAPS
2.5000 mg | ORAL_CAPSULE | Freq: Every day | ORAL | Status: DC
  Administered 2016-03-10 – 2016-03-16 (×7): 2.5 mg via ORAL
  Filled 2016-03-10 (×7): qty 1

## 2016-03-10 NOTE — Progress Notes (Signed)
Daily Rounding Note  03/10/2016, 8:22 AM  LOS: 20 days   SUBJECTIVE:       Asked to re-see pt re: uncontrolled N/V: same reason for consults of 5/31 and 6/31.  See those notes please for all details regarding pt's n/v after 01/15/16 subtotal gastrectomy for poorly diff'd adenoca.  Previous GI workups of EGD, colonoscopy.  2008 gastric emptying study with 63% retention at 2 hours.   Latest EGD of 02/05/16: Grade D esophagitis, non-bleeding anastomotic ulcer.  02/24/16 laparotomy with LOA and J tube placement.  02/29/16 KUB: mild colonic distention/?mild ileus.  03/01/16 CT: 3 x 6 cm hematoma of anterior abd wall near surgical incision. Some periodic improvement in N/V but essentially has never had any sustained improvement.   Seemed to have some improvement last week with addition/restart of IV Erythromycin when GI once again signed off.  Completed 2 weeks Diflucan for oral candidiasis.  Currently on liquid Protonix and Pepcid via J tube.  Vomiting up Carafate and GI cocktails, n/v even during hours of sleep.   J tube feedings tolerated.  Family (2 daughters always at bedside) state that the esophageal/chest burning has significantly improved since Protonix was switched from PO/IV to via J tube.  She has hx of hives with Reglan in past years.  stooling is as always, several daily and loose: stable for years following pelvic irradiation.       Scheduled Meds: . antiseptic oral rinse  7 mL Mouth Rinse BID  . enoxaparin (LOVENOX) injection  40 mg Subcutaneous Q24H  . erythromycin  250 mg Intravenous Q12H  . famotidine  20 mg Per Tube BID  . furosemide  20 mg Intravenous BID  . gi cocktail  30 mL Oral TID  .  HYDROmorphone (DILAUDID) injection  0.5 mg Intravenous Once  . insulin aspart  0-9 Units Subcutaneous Q4H  . levothyroxine  25 mcg Intravenous Daily  . lidocaine  1 patch Transdermal Q24H  . metoprolol tartrate  50 mg Per Tube BID    . pantoprazole sodium  40 mg Per Tube BID  . pregabalin  75 mg Oral BID  . sucralfate  1 g Oral TID WC & HS   Continuous Infusions: . feeding supplement (VITAL 1.5 CAL) 1,000 mL (03/09/16 1818)   PRN Meds:.acetaminophen **OR** acetaminophen, alum & mag hydroxide-simeth, calcium carbonate, diltiazem, LORazepam, methocarbamol (ROBAXIN)  IV, ondansetron **OR** ondansetron (ZOFRAN) IV, ondansetron, promethazine, sodium chloride flush   OBJECTIVE:         Vital signs in last 24 hours:    Temp:  [97.2 F (36.2 C)-99.5 F (37.5 C)] 98.6 F (37 C) (07/05 0437) Pulse Rate:  [97-107] 107 (07/05 0437) Resp:  [18] 18 (07/05 0437) BP: (139-158)/(66-92) 146/66 mmHg (07/05 0437) SpO2:  [97 %-98 %] 98 % (07/05 0437) Weight:  [64.456 kg (142 lb 1.6 oz)] 64.456 kg (142 lb 1.6 oz) (07/05 0437) Last BM Date: 03/09/16 Filed Weights   03/08/16 0510 03/09/16 0623 03/10/16 0437  Weight: 63.821 kg (140 lb 11.2 oz) 64.91 kg (143 lb 1.6 oz) 64.456 kg (142 lb 1.6 oz)   General: actually looks pretty well.  Way better than in past weeks.     Heart: RRR Chest: clear bil.  No dyspnea, cough, or wet vocal quality Abdomen: soft, NT, ND.  Active BS  Extremities: no CCE Neuro/Psych:  HOH. Oriented x 3.  No tremor.  Calm, alert.   Intake/Output from previous day: 07/04 0701 -  07/05 0700 In: 1896.6 [P.O.:10; I.V.:10; NG/GT:1476.6; IV Piggyback:300] Out: 1813 [Urine:1810; Stool:3]  Intake/Output this shift:    Lab Results:  Recent Labs  03/08/16 0514 03/09/16 0420 03/10/16 0500  WBC 6.0 4.8 5.2  HGB 8.7* 9.6* 9.6*  HCT 28.9* 32.0* 32.0*  PLT 238 199 268   BMET  Recent Labs  03/09/16 0420 03/10/16 0500  NA 143 144  K 3.0* 3.3*  CL 113* 111  CO2 24 26  GLUCOSE 104* 114*  BUN 10 9  CREATININE 0.69 0.66  CALCIUM 8.3* 8.4*   LFT No results for input(s): PROT, ALBUMIN, AST, ALT, ALKPHOS, BILITOT, BILIDIR, IBILI in the last 72 hours. PT/INR  Recent Labs  03/08/16 0514  LABPROT 15.5*   INR 1.22   Hepatitis Panel No results for input(s): HEPBSAG, HCVAB, HEPAIGM, HEPBIGM in the last 72 hours.  Studies/Results: No results found.  ASSESMENT:   *  Chronic N/V and intolerance to po.  Severe reflux esophagitis in pt s/p subtotal gastrectomy nearly 8 weeks ago for adenocarcinoma.  Previous confirmation of gastroparesis on 2008 GES.   Tolerating jejunal feeding at goal rate of 104ml/hour.    PLAN   *  ? Start trialing drug holiday/trial discontinuation of some of her meds?: Neurontin, Lidocaine patch, Protonix all list nausea as possible ADR (adverse drug reaction).  However daughters feels her neuropathy is such that Neurontin requires the neurontin. ? Trial of Marinol?     [name]  03/10/2016, 8:22 AM Pager: [phone]  GI ATTENDING  Patient has been seen in number of occasions for problems with nausea and vomiting. We are asked to see her for the same. Interval history reviewed. Patient personally seen and examined. Agree with interval progress note. Daughters in room. Her problem is related to severe alteration of anatomy from surgery with associated severe esophagitis and medical nausea. No obstruction. RECOMMEND 1. Give Zofran 4 mg IV every 6 hours running 2. Carafate slurry 4 times a day. Do NOT  give through feeding tube. she should take this orally  3. Okay to try Marinol  4. Elevate head of the bed greater than 45 at all times to avoid passive reflux  5. Diet as tolerated. Try full liquids. Primary calories through feeding tube  Expect improvement to be VERY slow. Be patient! Discussed with family and patient  Docia Chuck. Geri [place]., M.D. [facility] Division of Gastroenterology

## 2016-03-10 NOTE — Progress Notes (Signed)
Patient ID: [name], female   DOB: 1934/05/09, 82 y.o.   MRN: [id number]  Progress Note: General Surgery Service   Subjective: Continues to retch. Trying to back off on medicine.  Has kept down some clears, but has thrown up other clears.   Objective: Vital signs in last 24 hours: Temp:  [97.2 F (36.2 C)-99.5 F (37.5 C)] 98.6 F (37 C) (07/05 0437) Pulse Rate:  [97-107] 107 (07/05 0437) Resp:  [18] 18 (07/05 0437) BP: (139-158)/(66-92) 146/66 mmHg (07/05 0437) SpO2:  [97 %-98 %] 98 % (07/05 0437) Weight:  [64.456 kg (142 lb 1.6 oz)] 64.456 kg (142 lb 1.6 oz) (07/05 0437) Last BM Date: 03/09/16  Intake/Output from previous day: 07/04 0701 - 07/05 0700 In: 1896.6 [P.O.:10; I.V.:10; NG/GT:1476.6; IV Piggyback:300] Out: 1813 [Urine:1810; Stool:3] Intake/Output this shift:    Lungs: breathing comfortably  Abd: soft, NT, ND, hematoma stable.   Extremities: no edema  Neuro: AOx4  Lab Results: CBC   Recent Labs  03/09/16 0420 03/10/16 0500  WBC 4.8 5.2  HGB 9.6* 9.6*  HCT 32.0* 32.0*  PLT 199 268   BMET  Recent Labs  03/09/16 0420 03/10/16 0500  NA 143 144  K 3.0* 3.3*  CL 113* 111  CO2 24 26  GLUCOSE 104* 114*  BUN 10 9  CREATININE 0.69 0.66  CALCIUM 8.3* 8.4*   PT/INR  Recent Labs  03/08/16 0514  LABPROT 15.5*  INR 1.22   ABG No results for input(s): PHART, HCO3 in the last 72 hours.  Invalid input(s): PCO2, PO2  Studies/Results:  Anti-infectives: Anti-infectives    Start     Dose/Rate Route Frequency Ordered Stop   03/08/16 2200  erythromycin 250 mg in sodium chloride 0.9 % 100 mL IVPB     250 mg 100 mL/hr over 60 Minutes Intravenous Every 12 hours 03/08/16 1058     03/05/16 1600  erythromycin 250 mg in sodium chloride 0.9 % 100 mL IVPB  Status:  Discontinued     250 mg 100 mL/hr over 60 Minutes Intravenous Every 6 hours 03/05/16 1120 03/08/16 1058   03/04/16 2200  erythromycin 250 mg in sodium chloride 0.9 % 100 mL IVPB   Status:  Discontinued     250 mg 100 mL/hr over 60 Minutes Intravenous Every 12 hours 03/04/16 0803 03/04/16 1306   03/04/16 1400  erythromycin 250 mg in sodium chloride 0.9 % 100 mL IVPB  Status:  Discontinued     250 mg 100 mL/hr over 60 Minutes Intravenous Every 8 hours 03/04/16 1306 03/05/16 1120   03/02/16 1530  erythromycin 250 mg in sodium chloride 0.9 % 100 mL IVPB  Status:  Discontinued     250 mg 100 mL/hr over 60 Minutes Intravenous Every 6 hours 03/02/16 1424 03/04/16 0803   02/29/16 1000  fluconazole (DIFLUCAN) IVPB 200 mg     200 mg 100 mL/hr over 60 Minutes Intravenous Every 24 hours 02/29/16 0847 03/02/16 1111   02/24/16 1000  ceFAZolin (ANCEF) IVPB 2g/100 mL premix    Comments:  On call to OR.   2 g 200 mL/hr over 30 Minutes Intravenous To ShortStay Surgical 02/24/16 0848 02/24/16 1215   02/18/16 1000  fluconazole (DIFLUCAN) IVPB 200 mg  Status:  Discontinued     200 mg 100 mL/hr over 60 Minutes Intravenous Every 24 hours 02/18/16 0917 02/29/16 0847   02/17/16 2200  erythromycin 250 mg in sodium chloride 0.9 % 100 mL IVPB  Status:  Discontinued  250 mg 100 mL/hr over 60 Minutes Intravenous Every 12 hours 02/17/16 1720 02/17/16 1853   02/17/16 2100  erythromycin 250 mg in sodium chloride 0.9 % 100 mL IVPB  Status:  Discontinued     250 mg 100 mL/hr over 60 Minutes Intravenous Every 12 hours 02/17/16 1853 02/20/16 0737      Medications: Scheduled Meds: . antiseptic oral rinse  7 mL Mouth Rinse BID  . enoxaparin (LOVENOX) injection  40 mg Subcutaneous Q24H  . erythromycin  250 mg Intravenous Q12H  . famotidine  20 mg Per Tube BID  . furosemide  20 mg Intravenous BID  . gi cocktail  30 mL Oral TID  .  HYDROmorphone (DILAUDID) injection  0.5 mg Intravenous Once  . insulin aspart  0-9 Units Subcutaneous Q4H  . levothyroxine  25 mcg Intravenous Daily  . lidocaine  1 patch Transdermal Q24H  . metoprolol tartrate  50 mg Per Tube BID  . pantoprazole sodium  40 mg  Per Tube BID  . pregabalin  75 mg Oral BID  . sucralfate  1 g Oral TID WC & HS   Continuous Infusions: . feeding supplement (VITAL 1.5 CAL)     PRN Meds:.acetaminophen **OR** acetaminophen, alum & mag hydroxide-simeth, calcium carbonate, diltiazem, LORazepam, methocarbamol (ROBAXIN)  IV, ondansetron **OR** ondansetron (ZOFRAN) IV, ondansetron, promethazine, sodium chloride flush  Assessment/Plan: Patient Active Problem List   Diagnosis Date Noted  . Surgical site Hematoma 03/02/2016  . No blood products 02/27/2016  . Nausea and vomiting 02/19/2016  . AKI (acute kidney injury) ([place]) 02/17/2016  . S/P partial gastrectomy 02/17/2016  . Nausea & vomiting 02/17/2016  . Post gastrectomy syndrome   . Erosive esophagitis   . Uncontrollable vomiting   . Urinary tract infectious disease   . Gastroesophageal reflux disease with esophagitis   . Esophagitis   . Pressure ulcer 02/04/2016  . Hypernatremia 02/04/2016  . Hypokalemia 02/04/2016  . Dehydration 02/04/2016  . Intractable nausea and vomiting 02/03/2016  . Gastric cancer ([place]) 01/15/2016  . Persistent atrial fibrillation ([place]) 01/15/2016  . Protein-calorie malnutrition, moderate ([place]) 12/08/2015  . B12 deficiency 11/24/2015  . Iron deficiency anemia due to chronic blood loss 11/24/2015  . Refusal of blood transfusions as patient is Jehovah's Witness   . Adenocarcinoma of stomach ([place]) 11/11/2015  . CKD (chronic kidney disease), stage III 11/10/2015  . Vitamin B12 deficiency 11/09/2015  . Right knee pain 11/08/2015  . Peripheral nerve disease ([place]) 07/18/2015  . Long term current use of anticoagulant therapy 09/25/2014  . Rupture patellar tendon 07/29/2014  . Essential hypertension 06/04/2014  . Cardiac pacemaker (06/2010) 08/26/2012  . S/P right TKA 02/07/2012  . FHx/o Colon Cancer 07/27/2011  . Aortic stenosis, moderate 07/09/2011  . S/P repair of patent ductus arteriosus 04/22/2011  . Atrial fibrillation ([place]) Chadsvasc 5  12/02/2010  . Renal Cell Cancer ( 2003) 11/17/2007  . Hypothyroidism 11/17/2007  . Gastroparesis 11/17/2007   s/p Procedure(s): OPEN J TUBE 02/24/2016 -start cycling feeds.   - try full liquids sparingly since no difference in nausea. D/c staples.  Would not be surprised if some hematoma comes out of incision.     LOS: 20 days   Levenia Skalicky, [facility]

## 2016-03-10 NOTE — Progress Notes (Signed)
Staples removed per order.  Scant brown drainage noted.  Staples replaced with steri strips.  Patient tolerated well.  Will continue to monitor.

## 2016-03-10 NOTE — Progress Notes (Signed)
Pt/family has been refusing Carafate. Pt/family also refused bedtime Lasix dose because they thought the 1PM dose was a one-time dose. Will continue to monitor.

## 2016-03-10 NOTE — Progress Notes (Signed)
[facility] Progress Note  Patient: [name] [id number]   PCP: [name], MD DOB: 10/15/1933   DOA: 02/17/2016   DOS: 03/10/2016   Date of Service: the patient was seen and examined on 03/10/2016  Subjective:  Seen with 2 daughters at bedside, still has nausea with vomiting. Per daughter she vomits only bile, she never vomited tube feeding.  Brief hospital course: Pt. with PMH of A. fib, CKD,HTN, gastric cancer S/P proximal gastrectomy 01/2016; admitted on 02/17/2016, with complaint of vomiting, was found to have suspected esophagitis. Patient was given IV Diflucan, IV PPI and was started on TPN. Also started on IV erythromycin for motility. With initial treatment the patient was able to tolerate by mouth pills, but no food intake and therefore it was decided to place a J-tube, procedure done on 02/24/2016. Since the procedure pt has developed ileus which Appears to be improving. She also has anemia which appears to be dilutional and refusing blood transfusion. CT abdomen shows evidence of hematoma with any active bleeding. No other acute abnormality. Still with frequent nausea and vomiting Tolerating Tube feeds via J tube 7/4: [facility] reconsulted  Assessment and Plan: 1. Gastric adenocarcinoma/Recent subtotal gastrectomy for gastric adenocarcinoma May 2017 -with Intractable nausea and vomiting since mid May -last EGD 6/1 Dr.Stark with Grade D esophagitis and small non bleeding gastric ulcers at anastomoses -Tolerating TFs -gradually being advanced, goal of 45cc/hr, now at 40cc/hr -Weaned off TNA  -recurrent admissions with post op N/V -S/P J-tube placement 02/24/2016. -Has Been on IV PPI, Pepcid, IV erythromycin without any improvement, also completed 2 weeks of IV FLuconazole for Oral thrush. -Now on Liquid Protonix and Pepcid elixir via J tube -also completed 14 days of Diflucan for thrush -Discussed with [name], okay to discontinue the erythromycin and  minimize medications. -GI reconsulted, can try Marinol. -The goal for discharge is to tolerate cyclical tube feeding.  2. Paroxysmal A. Fib/Pacemaker implant -Chadsvasc 5. On warfarin at home -Currently rate controlled. Continue Cardizem when necessary,  -On liquid metoprolol via J tube -anticoagulation stopped due to post op hematoma, d/w Dr.Byerly (7/3) ok to resume warfarin however she is unable to tolerate anything PO  3. Hypothyroidism. -on IV synthroid, may need to pulverize and dissolve and give in syringe, at discharge  4. Postoperative blood loss anemia. -Surgical site hematoma, stable -No evidence of active bleeding and component of hemodilution -Patient refused blood transfusion,  -S/P IV Feraheme 5101, [place] Aranesp 1 as well as injection B-12 on 02/27/2016, after discussion with patient's hematology Dr Irene Limbo. -CT abdomen showed evidence of small surgical site hematoma what appears to be stable at present. -off anticoagulation, ok to restart Warfarin d/w CCS, but unable to take PO   5. Dehydration. -Mild renal insufficiency earlier. -on TNA, resolved  6. Adult failure to thrive  7. Mild Volume overload -Given Lasix yesterday, this is resolved continue Lasix as needed.  DVT Prophylaxis: on prophylactic Lovenox.  Advance goals of care discussion: DNR/DNI Family Communication: Daughters (Diana POA and [place]) at bedside Disposition: Discharge to home with home health when improved  Consultants: Gen. surgery Procedures: J-tube placement, PICC line placement, TPN  Antibiotics: Anti-infectives    Start     Dose/Rate Route Frequency Ordered Stop   03/08/16 2200  erythromycin 250 mg in sodium chloride 0.9 % 100 mL IVPB     250 mg 100 mL/hr over 60 Minutes Intravenous Every 12 hours 03/08/16 1058     03/05/16 1600  erythromycin 250 mg  in sodium chloride 0.9 % 100 mL IVPB  Status:  Discontinued     250 mg 100 mL/hr over 60 Minutes Intravenous Every 6 hours 03/05/16 1120  03/08/16 1058   03/04/16 2200  erythromycin 250 mg in sodium chloride 0.9 % 100 mL IVPB  Status:  Discontinued     250 mg 100 mL/hr over 60 Minutes Intravenous Every 12 hours 03/04/16 0803 03/04/16 1306   03/04/16 1400  erythromycin 250 mg in sodium chloride 0.9 % 100 mL IVPB  Status:  Discontinued     250 mg 100 mL/hr over 60 Minutes Intravenous Every 8 hours 03/04/16 1306 03/05/16 1120   03/02/16 1530  erythromycin 250 mg in sodium chloride 0.9 % 100 mL IVPB  Status:  Discontinued     250 mg 100 mL/hr over 60 Minutes Intravenous Every 6 hours 03/02/16 1424 03/04/16 0803   02/29/16 1000  fluconazole (DIFLUCAN) IVPB 200 mg     200 mg 100 mL/hr over 60 Minutes Intravenous Every 24 hours 02/29/16 0847 03/02/16 1111   02/24/16 1000  ceFAZolin (ANCEF) IVPB 2g/100 mL premix    Comments:  On call to OR.   2 g 200 mL/hr over 30 Minutes Intravenous To ShortStay Surgical 02/24/16 0848 02/24/16 1215   02/18/16 1000  fluconazole (DIFLUCAN) IVPB 200 mg  Status:  Discontinued     200 mg 100 mL/hr over 60 Minutes Intravenous Every 24 hours 02/18/16 0917 02/29/16 0847   02/17/16 2200  erythromycin 250 mg in sodium chloride 0.9 % 100 mL IVPB  Status:  Discontinued     250 mg 100 mL/hr over 60 Minutes Intravenous Every 12 hours 02/17/16 1720 02/17/16 1853   02/17/16 2100  erythromycin 250 mg in sodium chloride 0.9 % 100 mL IVPB  Status:  Discontinued     250 mg 100 mL/hr over 60 Minutes Intravenous Every 12 hours 02/17/16 1853 02/20/16 0737        Intake/Output Summary (Last 24 hours) at 03/10/16 1156 Last data filed at 03/10/16 1111  Gross per 24 hour  Intake 2026.58 ml  Output   2476 ml  Net -449.42 ml   Filed Weights   03/08/16 0510 03/09/16 0623 03/10/16 0437  Weight: 63.821 kg (140 lb 11.2 oz) 64.91 kg (143 lb 1.6 oz) 64.456 kg (142 lb 1.6 oz)    Objective: Physical Exam: Filed Vitals:   03/09/16 0623 03/09/16 1110 03/09/16 2100 03/10/16 0437  BP: 165/76 139/92 158/77 146/66    Pulse: 88 97 101 107  Temp: 98.8 F (37.1 C) 97.2 F (36.2 C) 99.5 F (37.5 C) 98.6 F (37 C)  TempSrc: Oral Oral Oral Oral  Resp: 18 18 18 18   Height:      Weight: 64.91 kg (143 lb 1.6 oz)   64.456 kg (142 lb 1.6 oz)  SpO2: 98% 97% 98% 98%    General: Alert, Awake, frail elderly woman, chronically ill appearing Eyes: PERRL, Conjunctiva normal ENT: Oral Mucosa clear moist. Cardiovascular: S1 and S2 Present, systolic murmur Respiratory:  Fine basilar crackles Abdomen: Bowel Sound present, Soft and mild tenderness, surgical scar noted, J tube noted Skin: no redness, no Rash  Extremities: no Pedal edema, no calf tenderness Neurologic: Grossly no focal neuro deficit. Bilaterally Equal motor strength  Data Reviewed: CBC:  Recent Labs Lab 03/05/16 0925 03/07/16 0445 03/08/16 0514 03/09/16 0420 03/10/16 0500  WBC 5.7 5.9 6.0 4.8 5.2  HGB 8.0* 8.2* 8.7* 9.6* 9.6*  HCT 30.2* 27.8* 28.9* 32.0* 32.0*  MCV 144.5*  102.6* 101.4* 102.2* 103.6*  PLT 201 228 238 199 [id number]   Basic Metabolic Panel:  Recent Labs Lab 03/04/16 0505 03/05/16 0925 03/07/16 0445 03/09/16 0420 03/10/16 0500  NA 137  --  140 143 144  K 3.8  --  3.6 3.0* 3.3*  CL 109  --  112* 113* 111  CO2 20*  --  23 24 26   GLUCOSE 96  --  93 104* 114*  BUN 19  --  12 10 9   CREATININE 0.71  --  0.72 0.69 0.66  CALCIUM 8.5*  --  8.2* 8.3* 8.4*  MG 1.8 2.4  --   --   --   PHOS 4.7*  --   --   --   --     Liver Function Tests:  Recent Labs Lab 03/04/16 0505  AST 33  ALT 35  ALKPHOS 107  BILITOT 0.7  PROT 5.5*  ALBUMIN 2.3*   No results for input(s): LIPASE, AMYLASE in the last 168 hours. No results for input(s): AMMONIA in the last 168 hours. Coagulation Profile:  Recent Labs Lab 03/04/16 0505 03/05/16 0925 03/06/16 1040 03/07/16 0445 03/08/16 0514  INR 1.23 1.38 1.30 1.29 1.22   Cardiac Enzymes: No results for input(s): CKTOTAL, CKMB, CKMBINDEX, TROPONINI in the last 168 hours. BNP (last 3  results) No results for input(s): PROBNP in the last 8760 hours.  CBG:  Recent Labs Lab 03/09/16 2013 03/10/16 0041 03/10/16 0432 03/10/16 0750 03/10/16 1147  GLUCAP 129* 116* 127* 130* 119*    Studies: No results found.   Scheduled Meds: . antiseptic oral rinse  7 mL Mouth Rinse BID  . enoxaparin (LOVENOX) injection  40 mg Subcutaneous Q24H  . erythromycin  250 mg Intravenous Q12H  . famotidine  20 mg Per Tube BID  . furosemide  20 mg Intravenous BID  . gi cocktail  30 mL Oral TID  .  HYDROmorphone (DILAUDID) injection  0.5 mg Intravenous Once  . insulin aspart  0-9 Units Subcutaneous Q4H  . levothyroxine  25 mcg Intravenous Daily  . lidocaine  1 patch Transdermal Q24H  . metoprolol tartrate  50 mg Per Tube BID  . pantoprazole sodium  40 mg Per Tube BID  . pregabalin  75 mg Oral BID  . sucralfate  1 g Oral TID WC & HS   Continuous Infusions: . feeding supplement (VITAL 1.5 CAL) 1,000 mL (03/10/16 0900)   PRN Meds: acetaminophen **OR** acetaminophen, alum & mag hydroxide-simeth, calcium carbonate, diltiazem, LORazepam, methocarbamol (ROBAXIN)  IV, ondansetron **OR** ondansetron (ZOFRAN) IV, ondansetron, promethazine, sodium chloride flush  Time spent: 30 minutes  Author: Domenic Polite, MD [facility] Pager: [phone]  03/10/2016 11:56 AM  If 7PM-7AM, please contact night-coverage at [email or web address], password [facility]

## 2016-03-10 NOTE — Progress Notes (Signed)
Nutrition Follow-up  DOCUMENTATION CODES:   Non-severe (moderate) malnutrition in context of chronic illness  INTERVENTION:   -Continue Vital 1.5 @ 50 ml/hr via J-tube and increase by 5 ml every 12 hours to goal rate of 55 ml/hr over 20 hour period.   Tube feeding regimen provides 1650 kcal (100% of needs), 74 grams of protein, and 840 ml of H2O.   -If nocturnal feedings recommended, recommend:  Initiate Vital 1.5 @ 90 ml/hr via J-tube over 12 hour period.   Tube feeding regimen provides 1620 kcal (100% of needs), 73 grams of protein, and 824 ml of H2O.   If pt transitions home without IVF, recommend 125 ml free water flush 4 times daily.  NUTRITION DIAGNOSIS:   Malnutrition related to chronic illness, altered GI function as evidenced by mild depletion of body fat, mild depletion of muscle mass, percent weight loss.  Ongoing  GOAL:   Patient will meet greater than or equal to 90% of their needs  Progressing  MONITOR:   Diet advancement, PO intake, Skin, I & O's, Labs, Weight trends  REASON FOR ASSESSMENT:   Consult Enteral/tube feeding initiation and management  ASSESSMENT:   82 y.o. female with a Past Medical History of PAF CAD, tachy/brady syndrome, RLS, GERD, uterine cancer and renal cancer, HTN, HLD, hypothyroidism, gastroparesis, Adenocarcinoma of the stomach s/p partial gastrectomy, ckd, and cva, who presents with nausea vomiting abdominal pain. This seems to be a recurrent issue for patient ever since she underwent partial gastrectomy.   S/p Procedure(s) 02/25/16: OPEN J TUBE (N/A)  TPN d/c on 03/06/16.   Spoke with pt and two daughters. Pt is in good spirits today and feels very optimistic regarding PO intake. Pt reports that she was able to take down some tea and grape juice yesterday evening and this morning without vomiting. Diet was just upgraded to full liquids and both pt and family optimistic that pt intake will improve with increased variety of foods.    Pt has been tolerating TF well with no vomiting. Family reports that plan is to slowly transition to nocturnal feedings (8-12 hours), but transition will be slow per MD, given pt hx.   Case discussed with RN. MD transitioned pt to 20 hour feeds this AM; RN to increase 5 ml/hr every 12 hours to goal rate of 55 ml/hr. Vital 1.5 is currently infusing at 50 ml/hr (providing 1500 kcals, 68 grams protein, and 764 ml fluid at current rate, which meets 100% kcal needs and 91% of estimated protein needs).   Labs reviewed: K: 3.3 (on IV supplementation). CBGS: 116-127.  Diet Order:  Diet full liquid Room service appropriate?: Yes; Fluid consistency:: Thin  Skin:  Reviewed, no issues  Last BM:  03/10/16  Height:   Ht Readings from Last 1 Encounters:  02/17/16 5\' 1"  (1.549 m)    Weight:   Wt Readings from Last 1 Encounters:  03/10/16 142 lb 1.6 oz (64.456 kg)    Ideal Body Weight:  47.7 kg  BMI:  Body mass index is 26.86 kg/(m^2).  Estimated Nutritional Needs:   Kcal:  1500-1700  Protein:  75-90 grams  Fluid:  1.5-1.7 L/day  EDUCATION NEEDS:   No education needs identified at this time  [name] A. Jimmye Norman, RD, LDN, CDE Pager: [phone] After hours Pager: [phone]

## 2016-03-11 LAB — BASIC METABOLIC PANEL
ANION GAP: 7 (ref 5–15)
BUN: 13 mg/dL (ref 6–20)
CALCIUM: 8.5 mg/dL — AB (ref 8.9–10.3)
CO2: 27 mmol/L (ref 22–32)
Chloride: 111 mmol/L (ref 101–111)
Creatinine, Ser: 0.68 mg/dL (ref 0.44–1.00)
GFR calc Af Amer: >60 mL/min (ref >60)
Glucose, Bld: 114 mg/dL — ABNORMAL HIGH (ref 65–99)
POTASSIUM: 3.6 mmol/L (ref 3.5–5.1)
SODIUM: 145 mmol/L (ref 135–145)

## 2016-03-11 LAB — CBC
HCT: 32.4 % — ABNORMAL LOW (ref 36.0–46.0)
Hemoglobin: 9.9 g/dL — ABNORMAL LOW (ref 12.0–15.0)
MCH: 31.8 pg (ref 26.0–34.0)
MCHC: 30.6 g/dL (ref 30.0–36.0)
MCV: 104.2 fL — ABNORMAL HIGH (ref 78.0–100.0)
PLATELETS: 270 10*3/uL (ref 150–400)
RBC: 3.11 MIL/uL — AB (ref 3.87–5.11)
RDW: 19.7 % — AB (ref 11.5–15.5)
WBC: 5.8 10*3/uL (ref 4.0–10.5)

## 2016-03-11 LAB — GLUCOSE, CAPILLARY
GLUCOSE-CAPILLARY: 125 mg/dL — AB (ref 65–99)
GLUCOSE-CAPILLARY: 131 mg/dL — AB (ref 65–99)
GLUCOSE-CAPILLARY: 91 mg/dL (ref 65–99)
Glucose-Capillary: 125 mg/dL — ABNORMAL HIGH (ref 65–99)
Glucose-Capillary: 116 mg/dL — ABNORMAL HIGH (ref 65–99)

## 2016-03-11 MED ORDER — POTASSIUM CHLORIDE 20 MEQ/15ML (10%) PO SOLN
60.0000 meq | Freq: Once | ORAL | Status: AC
  Administered 2016-03-11: 60 meq via ORAL
  Filled 2016-03-11: qty 45

## 2016-03-11 MED ORDER — LEVOTHYROXINE SODIUM 50 MCG PO TABS
50.0000 ug | ORAL_TABLET | Freq: Every day | ORAL | Status: DC
  Administered 2016-03-12 – 2016-03-16 (×5): 50 ug via ORAL
  Filled 2016-03-11 (×7): qty 1

## 2016-03-11 MED ORDER — VITAL 1.5 CAL PO LIQD
1000.0000 mL | ORAL | Status: DC
  Administered 2016-03-11 – 2016-03-12 (×2): 1000 mL
  Filled 2016-03-11 (×4): qty 1000

## 2016-03-11 MED ORDER — ONDANSETRON HCL 4 MG/2ML IJ SOLN
4.0000 mg | Freq: Four times a day (QID) | INTRAMUSCULAR | Status: DC
  Administered 2016-03-11: 4 mg via INTRAVENOUS
  Filled 2016-03-11: qty 2

## 2016-03-11 MED ORDER — LEVOTHYROXINE SODIUM 50 MCG PO TABS
50.0000 ug | ORAL_TABLET | Freq: Every day | ORAL | Status: DC
  Administered 2016-03-11: 50 ug
  Filled 2016-03-11: qty 1

## 2016-03-11 NOTE — Progress Notes (Signed)
[facility] Progress Note  Patient: [name] [id number]   PCP: [name], MD DOB: [birth year]   DOA: 02/17/2016   DOS: 03/11/2016   Date of Service: the patient was seen and examined on 03/11/2016  Subjective:  Seen with 2 daughters at bedside, Complaining about some nausea, or port heartburn and vomited Carafate in a.m. She had grits and milk for breakfast, no vomiting till the time I wrote this note.  Brief hospital course: Pt. with PMH of A. fib, CKD,HTN, gastric cancer S/P proximal gastrectomy 01/2016; admitted on 02/17/2016, with complaint of vomiting, was found to have suspected esophagitis. Patient was given IV Diflucan, IV PPI and was started on TPN. Also started on IV erythromycin for motility. With initial treatment the patient was able to tolerate by mouth pills, but no food intake and therefore it was decided to place a J-tube, procedure done on 02/24/2016. Since the procedure pt has developed ileus which Appears to be improving. She also has anemia which appears to be dilutional and refusing blood transfusion. CT abdomen shows evidence of hematoma with any active bleeding. No other acute abnormality. Still with frequent nausea and vomiting Tolerating Tube feeds via J tube 7/4: [facility] reconsulted  Assessment and Plan:  1. Gastric adenocarcinoma/Recent subtotal gastrectomy for gastric adenocarcinoma May 2017 -with Intractable nausea and vomiting since mid May -last EGD 6/1 Dr.Stark with Grade D esophagitis and small non bleeding gastric ulcers at anastomoses -Tolerating TFs -gradually being advanced, goal of 45cc/hr, now at 40cc/hr -Weaned off TNA  -recurrent admissions with post op N/V -S/P J-tube placement 02/24/2016. -Has Been on IV PPI, Pepcid, IV erythromycin without any improvement, also completed 2 weeks of IV FLuconazole for Oral thrush. -Now on Liquid Protonix and Pepcid elixir via J tube -also completed 14 days of Diflucan for  thrush -Discussed with [name], okay to discontinue the erythromycin and minimize medications. -GI reconsulted, recommended discontinue Phenergan, schedule the Zofran and Carafate, keep head elevated.  2. Paroxysmal A. Fib/Pacemaker implant -Chadsvasc 5. On warfarin at home -Currently rate controlled. Continue Cardizem when necessary,  -On liquid metoprolol via J tube -Per [name], okay to resume warfarin, will restart.  3. Hypothyroidism. -on IV synthroid, will switch to oral Synthroid see if she tolerates them.  4. Postoperative blood loss anemia. -Surgical site hematoma, stable -No evidence of active bleeding and component of hemodilution -Patient refused blood transfusion,  -S/P IV Feraheme 5101, [place] Aranesp 1 as well as injection B-12 on 02/27/2016, after discussion with patient's hematology Dr Irene Limbo. -CT abdomen showed evidence of small surgical site hematoma what appears to be stable at present. -off anticoagulation, ok to restart Warfarin d/w CCS, but unable to take PO   5. Dehydration. -Mild renal insufficiency earlier. -on TNA, resolved  6. Adult failure to thrive -Currently on tube feeding.  7. Mild Volume overload -Given Lasix, this is resolved continue Lasix as needed.  DVT Prophylaxis: on prophylactic Lovenox.  Advance goals of care discussion: DNR/DNI Family Communication: Daughters ([place]iana POA and [place]) at bedside Disposition: Discharge to home with home health when improved  Consultants: Gen. surgery Procedures: J-tube placement, PICC line placement, TPN  Antibiotics: Anti-infectives    Start     Dose/Rate Route Frequency Ordered Stop   03/08/16 2200  erythromycin 250 mg in sodium chloride 0.9 % 100 mL IVPB  Status:  Discontinued     250 mg 100 mL/hr over 60 Minutes Intravenous Every 12 hours 03/08/16 1058 03/10/16 1209   03/05/16 1600  erythromycin 250 mg in sodium chloride 0.9 % 100 mL IVPB  Status:  Discontinued     250 mg 100 mL/hr over 60  Minutes Intravenous Every 6 hours 03/05/16 1120 03/08/16 1058   03/04/16 2200  erythromycin 250 mg in sodium chloride 0.9 % 100 mL IVPB  Status:  Discontinued     250 mg 100 mL/hr over 60 Minutes Intravenous Every 12 hours 03/04/16 0803 03/04/16 1306   03/04/16 1400  erythromycin 250 mg in sodium chloride 0.9 % 100 mL IVPB  Status:  Discontinued     250 mg 100 mL/hr over 60 Minutes Intravenous Every 8 hours 03/04/16 1306 03/05/16 1120   03/02/16 1530  erythromycin 250 mg in sodium chloride 0.9 % 100 mL IVPB  Status:  Discontinued     250 mg 100 mL/hr over 60 Minutes Intravenous Every 6 hours 03/02/16 1424 03/04/16 0803   02/29/16 1000  fluconazole (DIFLUCAN) IVPB 200 mg     200 mg 100 mL/hr over 60 Minutes Intravenous Every 24 hours 02/29/16 0847 03/02/16 1111   02/24/16 1000  ceFAZolin (ANCEF) IVPB 2g/100 mL premix    Comments:  On call to OR.   2 g 200 mL/hr over 30 Minutes Intravenous To ShortStay Surgical 02/24/16 0848 02/24/16 1215   02/18/16 1000  fluconazole (DIFLUCAN) IVPB 200 mg  Status:  Discontinued     200 mg 100 mL/hr over 60 Minutes Intravenous Every 24 hours 02/18/16 0917 02/29/16 0847   02/17/16 2200  erythromycin 250 mg in sodium chloride 0.9 % 100 mL IVPB  Status:  Discontinued     250 mg 100 mL/hr over 60 Minutes Intravenous Every 12 hours 02/17/16 1720 02/17/16 1853   02/17/16 2100  erythromycin 250 mg in sodium chloride 0.9 % 100 mL IVPB  Status:  Discontinued     250 mg 100 mL/hr over 60 Minutes Intravenous Every 12 hours 02/17/16 1853 02/20/16 0737        Intake/Output Summary (Last 24 hours) at 03/11/16 1046 Last data filed at 03/11/16 0840  Gross per 24 hour  Intake    720 ml  Output   1175 ml  Net   -455 ml   Filed Weights   03/09/16 0623 03/10/16 0437 03/11/16 0424  Weight: 64.91 kg (143 lb 1.6 oz) 64.456 kg (142 lb 1.6 oz) 62.188 kg (137 lb 1.6 oz)    Objective: Physical Exam: Filed Vitals:   03/10/16 1756 03/10/16 2004 03/10/16 2321 03/11/16  0424  BP: 145/74 133/52 121/50 125/64  Pulse: 99 85 73 97  Temp: 98.2 F (36.8 C) 97.7 F (36.5 C)  98.3 F (36.8 C)  TempSrc: Oral Oral  Oral  Resp: 18 18  18   Height:      Weight:    62.188 kg (137 lb 1.6 oz)  SpO2: 98% 99%  99%    General: Alert, Awake, frail elderly woman, chronically ill appearing Eyes: PERRL, Conjunctiva normal ENT: Oral Mucosa clear moist. Cardiovascular: S1 and S2 Present, systolic murmur Respiratory:  Fine basilar crackles Abdomen: Bowel Sound present, Soft and mild tenderness, surgical scar noted, J tube noted Skin: no redness, no Rash  Extremities: no Pedal edema, no calf tenderness Neurologic: Grossly no focal neuro deficit. Bilaterally Equal motor strength  Data Reviewed: CBC:  Recent Labs Lab 03/07/16 0445 03/08/16 0514 03/09/16 0420 03/10/16 0500 03/11/16 0415  WBC 5.9 6.0 4.8 5.2 5.8  HGB 8.2* 8.7* 9.6* 9.6* 9.9*  HCT 27.8* 28.9* 32.0* 32.0* 32.4*  MCV 102.6* 101.4*  102.2* 103.6* 104.2*  PLT 228 238 199 268 [id number]   Basic Metabolic Panel:  Recent Labs Lab 03/05/16 0925 03/07/16 0445 03/09/16 0420 03/10/16 0500 03/11/16 0415  NA  --  140 143 144 145  K  --  3.6 3.0* 3.3* 3.6  CL  --  112* 113* 111 111  CO2  --  23 24 26 27   GLUCOSE  --  93 104* 114* 114*  BUN  --  12 10 9 13   CREATININE  --  0.72 0.69 0.66 0.68  CALCIUM  --  8.2* 8.3* 8.4* 8.5*  MG 2.4  --   --   --   --     Liver Function Tests: No results for input(s): AST, ALT, ALKPHOS, BILITOT, PROT, ALBUMIN in the last 168 hours. No results for input(s): LIPASE, AMYLASE in the last 168 hours. No results for input(s): AMMONIA in the last 168 hours. Coagulation Profile:  Recent Labs Lab 03/05/16 0925 03/06/16 1040 03/07/16 0445 03/08/16 0514  INR 1.38 1.30 1.29 1.22   Cardiac Enzymes: No results for input(s): CKTOTAL, CKMB, CKMBINDEX, TROPONINI in the last 168 hours. BNP (last 3 results) No results for input(s): PROBNP in the last 8760  hours.  CBG:  Recent Labs Lab 03/10/16 1656 03/10/16 1950 03/10/16 2324 03/11/16 0419 03/11/16 0742  GLUCAP 92 107* 125* 125* 131*    Studies: No results found.   Scheduled Meds: . antiseptic oral rinse  7 mL Mouth Rinse BID  . diltiazem  60 mg Per Tube Q6H  . dronabinol  2.5 mg Oral QAC lunch  . enoxaparin (LOVENOX) injection  40 mg Subcutaneous Q24H  . famotidine  20 mg Per Tube BID  .  HYDROmorphone (DILAUDID) injection  0.5 mg Intravenous Once  . insulin aspart  0-9 Units Subcutaneous Q4H  . levothyroxine  50 mcg Per Tube Daily  . lidocaine  1 patch Transdermal Q24H  . metoprolol tartrate  50 mg Per Tube BID  . ondansetron  4 mg Intravenous Q6H  . pantoprazole sodium  40 mg Per Tube BID  . pregabalin  75 mg Oral BID  . sucralfate  1 g Oral TID WC & HS   Continuous Infusions: . feeding supplement (VITAL 1.5 CAL)     PRN Meds: acetaminophen **OR** acetaminophen, alum & mag hydroxide-simeth, LORazepam, ondansetron **OR** ondansetron (ZOFRAN) IV, sodium chloride flush  Time spent: 30 minutes  Author: Birdie Hopes Pager: [phone] 03/11/2016, 10:50 AM If 7PM-7AM, please contact night-coverage at [email or web address], password [facility]

## 2016-03-11 NOTE — Care Management Important Message (Signed)
Important Message  Patient Details  Name: [name] MRN: [id number] Date of Birth: [birth year]   Medicare Important Message Given:  Yes    Loann Quill 03/11/2016, 8:58 AM

## 2016-03-11 NOTE — Progress Notes (Signed)
Daily Rounding Note  03/11/2016, 8:35 AM  LOS: 21 days   SUBJECTIVE:       Still retching, vomiting: vomited Carafate this AM though she tolerated a couple of doses yesterday.  The nausea/vomiting is variable: has some better days, followed by bad days.   Pt's daughters have been declining/refusing the scheduled low dose Phenergan as it makes her RLS (restless leg syndrome) go into overdrive.   Pt is walking in halls at least 1 x per shift.    OBJECTIVE:         Vital signs in last 24 hours:    Temp:  [97.7 F (36.5 C)-98.3 F (36.8 C)] 98.3 F (36.8 C) (07/06 0424) Pulse Rate:  [73-99] 97 (07/06 0424) Resp:  [18] 18 (07/06 0424) BP: (121-145)/(50-74) 125/64 mmHg (07/06 0424) SpO2:  [98 %-99 %] 99 % (07/06 0424) Weight:  [62.188 kg (137 lb 1.6 oz)] 62.188 kg (137 lb 1.6 oz) (07/06 0424) Last BM Date: 03/10/16 Filed Weights   03/09/16 0623 03/10/16 0437 03/11/16 0424  Weight: 64.91 kg (143 lb 1.6 oz) 64.456 kg (142 lb 1.6 oz) 62.188 kg (137 lb 1.6 oz)   General: resting quietly.  Did not physically examine pt.    Chest: unlabored breathing Neuro/Psych:  Resting.  Did not awaken.    Intake/Output from previous day: 07/05 0701 - 07/06 0700 In: 720 [P.O.:720] Out: 1400 [Urine:1400]  Intake/Output this shift:    Lab Results:  Recent Labs  03/09/16 0420 03/10/16 0500 03/11/16 0415  WBC 4.8 5.2 5.8  HGB 9.6* 9.6* 9.9*  HCT 32.0* 32.0* 32.4*  PLT 199 268 270   BMET  Recent Labs  03/09/16 0420 03/10/16 0500 03/11/16 0415  NA 143 144 145  K 3.0* 3.3* 3.6  CL 113* 111 111  CO2 24 26 27   GLUCOSE 104* 114* 114*  BUN 10 9 13   CREATININE 0.69 0.66 0.68  CALCIUM 8.3* 8.4* 8.5*   LFT No results for input(s): PROT, ALBUMIN, AST, ALT, ALKPHOS, BILITOT, BILIDIR, IBILI in the last 72 hours. PT/INR No results for input(s): LABPROT, INR in the last 72 hours. Hepatitis Panel No results for input(s): HEPBSAG,  HCVAB, HEPAIGM, HEPBIGM in the last 72 hours.  Studies/Results: No results found.   Scheduled Meds: . antiseptic oral rinse  7 mL Mouth Rinse BID  . diltiazem  60 mg Per Tube Q6H  . dronabinol  2.5 mg Oral QAC lunch  . enoxaparin (LOVENOX) injection  40 mg Subcutaneous Q24H  . famotidine  20 mg Per Tube BID  .  HYDROmorphone (DILAUDID) injection  0.5 mg Intravenous Once  . insulin aspart  0-9 Units Subcutaneous Q4H  . lidocaine  1 patch Transdermal Q24H  . metoprolol tartrate  50 mg Per Tube BID  . ondansetron  4 mg Intravenous Q6H  . pantoprazole sodium  40 mg Per Tube BID  . pregabalin  75 mg Oral BID  . promethazine  6.25 mg Intravenous Q6H  . sucralfate  1 g Oral TID WC & HS   Continuous Infusions: . feeding supplement (VITAL 1.5 CAL)     PRN Meds:.acetaminophen **OR** acetaminophen, alum & mag hydroxide-simeth, LORazepam, ondansetron **OR** ondansetron (ZOFRAN) IV, sodium chloride flush   ASSESMENT:   * Chronic N/V and intolerance to po. Severe reflux esophagitis in pt s/p subtotal gastrectomy nearly 8 weeks ago for adenocarcinoma.Added scheduled Zofran, Marinol 7/7 to ongoing Protonix.  Erythromycin discontinued 7/6.  Tolerating jejunal feeding at  goal rate of 58ml/hour.     PLAN   *  Stop Phenergan due to neuro s/e.      Azucena Freed  03/11/2016, 8:35 AM Pager: [phone]  GI ATTENDING  Interval history data reviewed. Patient seen and examined. Agree with interval progress note. Nothing to add.  Docia Chuck. Geri Seminole., M.D. [facility] Division of Gastroenterology

## 2016-03-11 NOTE — Progress Notes (Signed)
Patient ID: [name], female   DOB: Apr 18, 1934, 82 y.o.   MRN: [id number]  Progress Note: General Surgery Service   Subjective: Had a good day yesterday but retched this AM.  Had issues with carafate.  Threw that back up.     Objective: Vital signs in last 24 hours: Temp:  [97.7 F (36.5 C)-98.3 F (36.8 C)] 98.3 F (36.8 C) (07/06 0424) Pulse Rate:  [73-99] 97 (07/06 0424) Resp:  [18] 18 (07/06 0424) BP: (121-145)/(50-74) 125/64 mmHg (07/06 0424) SpO2:  [98 %-99 %] 99 % (07/06 0424) Weight:  [62.188 kg (137 lb 1.6 oz)] 62.188 kg (137 lb 1.6 oz) (07/06 0424) Last BM Date: 03/10/16  Intake/Output from previous day: 07/05 0701 - 07/06 0700 In: 720 [P.O.:720] Out: 1400 [Urine:1400] Intake/Output this shift:    Lungs: breathing comfortably  Abd: soft, NT, ND, hematoma stable. Staples removed.   Extremities: no edema  Neuro: AOx4  Lab Results: CBC   Recent Labs  03/10/16 0500 03/11/16 0415  WBC 5.2 5.8  HGB 9.6* 9.9*  HCT 32.0* 32.4*  PLT 268 270   BMET  Recent Labs  03/10/16 0500 03/11/16 0415  NA 144 145  K 3.3* 3.6  CL 111 111  CO2 26 27  GLUCOSE 114* 114*  BUN 9 13  CREATININE 0.66 0.68  CALCIUM 8.4* 8.5*   PT/INR No results for input(s): LABPROT, INR in the last 72 hours. ABG No results for input(s): PHART, HCO3 in the last 72 hours.  Invalid input(s): PCO2, PO2  Studies/Results:  Anti-infectives: Anti-infectives    Start     Dose/Rate Route Frequency Ordered Stop   03/08/16 2200  erythromycin 250 mg in sodium chloride 0.9 % 100 mL IVPB  Status:  Discontinued     250 mg 100 mL/hr over 60 Minutes Intravenous Every 12 hours 03/08/16 1058 03/10/16 1209   03/05/16 1600  erythromycin 250 mg in sodium chloride 0.9 % 100 mL IVPB  Status:  Discontinued     250 mg 100 mL/hr over 60 Minutes Intravenous Every 6 hours 03/05/16 1120 03/08/16 1058   03/04/16 2200  erythromycin 250 mg in sodium chloride 0.9 % 100 mL IVPB  Status:  Discontinued      250 mg 100 mL/hr over 60 Minutes Intravenous Every 12 hours 03/04/16 0803 03/04/16 1306   03/04/16 1400  erythromycin 250 mg in sodium chloride 0.9 % 100 mL IVPB  Status:  Discontinued     250 mg 100 mL/hr over 60 Minutes Intravenous Every 8 hours 03/04/16 1306 03/05/16 1120   03/02/16 1530  erythromycin 250 mg in sodium chloride 0.9 % 100 mL IVPB  Status:  Discontinued     250 mg 100 mL/hr over 60 Minutes Intravenous Every 6 hours 03/02/16 1424 03/04/16 0803   02/29/16 1000  fluconazole (DIFLUCAN) IVPB 200 mg     200 mg 100 mL/hr over 60 Minutes Intravenous Every 24 hours 02/29/16 0847 03/02/16 1111   02/24/16 1000  ceFAZolin (ANCEF) IVPB 2g/100 mL premix    Comments:  On call to OR.   2 g 200 mL/hr over 30 Minutes Intravenous To ShortStay Surgical 02/24/16 0848 02/24/16 1215   02/18/16 1000  fluconazole (DIFLUCAN) IVPB 200 mg  Status:  Discontinued     200 mg 100 mL/hr over 60 Minutes Intravenous Every 24 hours 02/18/16 0917 02/29/16 0847   02/17/16 2200  erythromycin 250 mg in sodium chloride 0.9 % 100 mL IVPB  Status:  Discontinued  250 mg 100 mL/hr over 60 Minutes Intravenous Every 12 hours 02/17/16 1720 02/17/16 1853   02/17/16 2100  erythromycin 250 mg in sodium chloride 0.9 % 100 mL IVPB  Status:  Discontinued     250 mg 100 mL/hr over 60 Minutes Intravenous Every 12 hours 02/17/16 1853 02/20/16 0737      Medications: Scheduled Meds: . antiseptic oral rinse  7 mL Mouth Rinse BID  . diltiazem  60 mg Per Tube Q6H  . dronabinol  2.5 mg Oral QAC lunch  . enoxaparin (LOVENOX) injection  40 mg Subcutaneous Q24H  . famotidine  20 mg Per Tube BID  .  HYDROmorphone (DILAUDID) injection  0.5 mg Intravenous Once  . insulin aspart  0-9 Units Subcutaneous Q4H  . lidocaine  1 patch Transdermal Q24H  . metoprolol tartrate  50 mg Per Tube BID  . ondansetron  4 mg Intravenous Q6H  . pantoprazole sodium  40 mg Per Tube BID  . pregabalin  75 mg Oral BID  . promethazine  6.25 mg  Intravenous Q6H  . sucralfate  1 g Oral TID WC & HS   Continuous Infusions: . feeding supplement (VITAL 1.5 CAL)     PRN Meds:.acetaminophen **OR** acetaminophen, alum & mag hydroxide-simeth, LORazepam, ondansetron **OR** ondansetron (ZOFRAN) IV, sodium chloride flush  Assessment/Plan: Patient Active Problem List   Diagnosis Date Noted  . Feeding difficulties   . Nausea with vomiting   . Surgical site Hematoma 03/02/2016  . No blood products 02/27/2016  . Nausea and vomiting 02/19/2016  . AKI (acute kidney injury) ([place]) 02/17/2016  . S/P partial gastrectomy 02/17/2016  . Nausea & vomiting 02/17/2016  . Post gastrectomy syndrome   . Erosive esophagitis   . Uncontrollable vomiting   . Urinary tract infectious disease   . Gastroesophageal reflux disease with esophagitis   . Esophagitis   . Pressure ulcer 02/04/2016  . Hypernatremia 02/04/2016  . Hypokalemia 02/04/2016  . Dehydration 02/04/2016  . Intractable nausea and vomiting 02/03/2016  . Gastric cancer ([place]) 01/15/2016  . Persistent atrial fibrillation ([place]) 01/15/2016  . Protein-calorie malnutrition, moderate ([place]) 12/08/2015  . B12 deficiency 11/24/2015  . Iron deficiency anemia due to chronic blood loss 11/24/2015  . Refusal of blood transfusions as patient is Jehovah's Witness   . Adenocarcinoma of stomach ([place]) 11/11/2015  . CKD (chronic kidney disease), stage III 11/10/2015  . Vitamin B12 deficiency 11/09/2015  . Right knee pain 11/08/2015  . Peripheral nerve disease ([place]) 07/18/2015  . Long term current use of anticoagulant therapy 09/25/2014  . Rupture patellar tendon 07/29/2014  . Essential hypertension 06/04/2014  . Cardiac pacemaker (06/2010) 08/26/2012  . S/P right TKA 02/07/2012  . FHx/o Colon Cancer 07/27/2011  . Aortic stenosis, moderate 07/09/2011  . S/P repair of patent ductus arteriosus 04/22/2011  . Atrial fibrillation ([place]) Chadsvasc 5 12/02/2010  . Renal Cell Cancer ( 2003) 11/17/2007  .  Hypothyroidism 11/17/2007  . Gastroparesis 11/17/2007   s/p Procedure(s): OPEN J TUBE 02/24/2016 -Cycling feeds. Move toward 14 hour cycle or 12 hour cycle.   - try full liquids sparingly since no difference in nausea. Trying marinol for nausea.       LOS: 21 days   Romond Pipkins, [facility]

## 2016-03-11 NOTE — Progress Notes (Signed)
PT Cancellation Note  Patient Details Name: [name] MRN: [id number] DOB: [birth year]   Cancelled Treatment:    Reason Eval/Treat Not Completed: Medical issues which prohibited therapy. Pt reports she has been experiencing nausea and vomiting all day. PT will attempt to see pt tomorrow, 03/12/16, schedule permitting.  Collie Siad PT, DPT  Pager: [phone] Phone: [phone] 03/11/2016, 2:36 PM

## 2016-03-11 NOTE — Progress Notes (Signed)
Tube feedings ordered for possible discharge home in the next 24- 48 hrs / when medically stable; B Pennie Rushing [phone]

## 2016-03-12 DIAGNOSIS — E038 Other specified hypothyroidism: Secondary | ICD-10-CM

## 2016-03-12 LAB — BASIC METABOLIC PANEL WITH GFR
Anion gap: 6 (ref 5–15)
BUN: 14 mg/dL (ref 6–20)
CO2: 27 mmol/L (ref 22–32)
Calcium: 8.3 mg/dL — ABNORMAL LOW (ref 8.9–10.3)
Chloride: 109 mmol/L (ref 101–111)
Creatinine, Ser: 0.67 mg/dL (ref 0.44–1.00)
GFR calc Af Amer: >60 mL/min
GFR calc non Af Amer: >60 mL/min
Glucose, Bld: 115 mg/dL — ABNORMAL HIGH (ref 65–99)
Potassium: 3.6 mmol/L (ref 3.5–5.1)
Sodium: 142 mmol/L (ref 135–145)

## 2016-03-12 LAB — CBC
HCT: 34.1 % — ABNORMAL LOW (ref 36.0–46.0)
Hemoglobin: 10.1 g/dL — ABNORMAL LOW (ref 12.0–15.0)
MCH: 31.1 pg (ref 26.0–34.0)
MCHC: 29.6 g/dL — ABNORMAL LOW (ref 30.0–36.0)
MCV: 104.9 fL — ABNORMAL HIGH (ref 78.0–100.0)
Platelets: 249 10*3/uL (ref 150–400)
RBC: 3.25 MIL/uL — ABNORMAL LOW (ref 3.87–5.11)
RDW: 19.5 % — ABNORMAL HIGH (ref 11.5–15.5)
WBC: 6.4 10*3/uL (ref 4.0–10.5)

## 2016-03-12 LAB — GLUCOSE, CAPILLARY
Glucose-Capillary: 101 mg/dL — ABNORMAL HIGH (ref 65–99)
Glucose-Capillary: 110 mg/dL — ABNORMAL HIGH (ref 65–99)
Glucose-Capillary: 120 mg/dL — ABNORMAL HIGH (ref 65–99)
Glucose-Capillary: 142 mg/dL — ABNORMAL HIGH (ref 65–99)
Glucose-Capillary: 151 mg/dL — ABNORMAL HIGH (ref 65–99)
Glucose-Capillary: 83 mg/dL (ref 65–99)

## 2016-03-12 LAB — MAGNESIUM: Magnesium: 1.7 mg/dL (ref 1.7–2.4)

## 2016-03-12 MED ORDER — ONDANSETRON HCL 4 MG/2ML IJ SOLN
4.0000 mg | Freq: Four times a day (QID) | INTRAMUSCULAR | Status: DC
  Administered 2016-03-12 – 2016-03-16 (×18): 4 mg via INTRAVENOUS
  Filled 2016-03-12 (×17): qty 2

## 2016-03-12 MED ORDER — SIMETHICONE 80 MG PO CHEW
80.0000 mg | CHEWABLE_TABLET | Freq: Four times a day (QID) | ORAL | Status: DC
  Administered 2016-03-12 – 2016-03-16 (×17): 80 mg via ORAL
  Filled 2016-03-12 (×17): qty 1

## 2016-03-12 MED ORDER — WARFARIN - PHARMACIST DOSING INPATIENT: Freq: Every day | Status: DC

## 2016-03-12 MED ORDER — WARFARIN SODIUM 2 MG PO TABS
2.0000 mg | ORAL_TABLET | Freq: Once | ORAL | Status: AC
  Administered 2016-03-12: 2 mg via ORAL
  Filled 2016-03-12 (×2): qty 1

## 2016-03-12 NOTE — Progress Notes (Signed)
Pt transferred  from [place] accompanied by daughters and sending RN. Family and RN reported  TF Vitals infusing at rate 65 upon arrival to be held at 1300 for 4 hours. Unable to find order / instructions  In Epic to hold. But current order to infuse  Feeding supplement Vital 1.5 CAL continuously at 45 ml/hr. Formula held at  1300 per Daughter [name] adamant request. Called sending RN back to inquire nature of instructions given with no clear response. [facility]CS triage RN contacted for clarification, Awaiting call back.

## 2016-03-12 NOTE — Progress Notes (Signed)
ANTICOAGULATION CONSULT NOTE - Follow Up Consult  Pharmacy Consult for warfarin Indication: atrial fibrillation   Labs:  Recent Labs  03/10/16 0500 03/11/16 0415 03/12/16 0425  HGB 9.6* 9.9* 10.1*  HCT 32.0* 32.4* 34.1*  PLT 268 270 249  CREATININE 0.66 0.68 0.67    Assessment: 82 yo presenting with n/v. Pharmacy consulted to dose/monitor heparin gtt for Hx Afib CVR on diltiazem. Pt  post op 6/20 gastric surgery and  Jtube placed.  Heparin drip stopped and has been on enoxaparin for VTE px.  Now to restart warfarin. CBC stable INR 1.22 on 7/3 Continues to have N/V off TPN on TF.     Goal of Therapy:  INR 2-3   Plan:  Warfarin 2mg  x1 today Daily Protime Monitor s/s bleeding  Bonnita Nasuti Pharm.D. CPP, BCPS Clinical Pharmacist [phone] 03/12/2016 10:14 AM

## 2016-03-12 NOTE — Progress Notes (Signed)
PT Cancellation Note  Patient Details Name: [name] MRN: [id number] DOB: [birth year]   Cancelled Treatment:    Reason Eval/Treat Not Completed: Medical issues which prohibited therapy.  N and V with pt reporting she felt better last evening and did do some walking.  Transferring to [facility]N in a short time.   Ramond Dial 03/12/2016, 12:37 PM    Mee Hives, PT MS Acute Rehab Dept. Number: [place] [facility] [place]

## 2016-03-12 NOTE — Progress Notes (Addendum)
Pt's family has refused increasing the tube feed rate per written nursing care order. Pt's family states that this is the 2nd night in a row where the patient has had hourly restroom breaks (lots of gas and abdominal pain, but scant stool each time) and no sleep. Will continue to monitor.

## 2016-03-12 NOTE — Progress Notes (Signed)
Report given to Ayaba, RN nurse that will be receiving pt.  Karie Kirks, Therapist, sports.

## 2016-03-12 NOTE — Progress Notes (Signed)
Patient ID: [name], female   DOB: 06-17-1934, 82 y.o.   MRN: [id number]  Progress Note: General Surgery Service   Subjective: Pt had significant gas with increased tube feeds.  The increase was held for a while.    Objective: Vital signs in last 24 hours: Temp:  [97.6 F (36.4 C)-98.3 F (36.8 C)] 98.3 F (36.8 C) (07/07 1256) Pulse Rate:  [75-95] 79 (07/07 1256) Resp:  [18-20] 18 (07/07 1256) BP: (118-144)/(56-82) 118/56 mmHg (07/07 1256) SpO2:  [97 %-100 %] 100 % (07/07 1256) Weight:  [61.871 kg (136 lb 6.4 oz)-66.996 kg (147 lb 11.2 oz)] 66.996 kg (147 lb 11.2 oz) (07/07 1256) Last BM Date: 03/11/16  Intake/Output from previous day: 07/06 0701 - 07/07 0700 In: 710 [P.O.:690; I.V.:20] Out: 676 [Urine:675; Stool:1] Intake/Output this shift:    Lab Results: CBC   Recent Labs  03/11/16 0415 03/12/16 0425  WBC 5.8 6.4  HGB 9.9* 10.1*  HCT 32.4* 34.1*  PLT 270 249   BMET  Recent Labs  03/11/16 0415 03/12/16 0425  NA 145 142  K 3.6 3.6  CL 111 109  CO2 27 27  GLUCOSE 114* 115*  BUN 13 14  CREATININE 0.68 0.67  CALCIUM 8.5* 8.3*   PT/INR No results for input(s): LABPROT, INR in the last 72 hours. ABG No results for input(s): PHART, HCO3 in the last 72 hours.  Invalid input(s): PCO2, PO2  Studies/Results:  Anti-infectives: Anti-infectives    Start     Dose/Rate Route Frequency Ordered Stop   03/08/16 2200  erythromycin 250 mg in sodium chloride 0.9 % 100 mL IVPB  Status:  Discontinued     250 mg 100 mL/hr over 60 Minutes Intravenous Every 12 hours 03/08/16 1058 03/10/16 1209   03/05/16 1600  erythromycin 250 mg in sodium chloride 0.9 % 100 mL IVPB  Status:  Discontinued     250 mg 100 mL/hr over 60 Minutes Intravenous Every 6 hours 03/05/16 1120 03/08/16 1058   03/04/16 2200  erythromycin 250 mg in sodium chloride 0.9 % 100 mL IVPB  Status:  Discontinued     250 mg 100 mL/hr over 60 Minutes Intravenous Every 12 hours 03/04/16 0803 03/04/16  1306   03/04/16 1400  erythromycin 250 mg in sodium chloride 0.9 % 100 mL IVPB  Status:  Discontinued     250 mg 100 mL/hr over 60 Minutes Intravenous Every 8 hours 03/04/16 1306 03/05/16 1120   03/02/16 1530  erythromycin 250 mg in sodium chloride 0.9 % 100 mL IVPB  Status:  Discontinued     250 mg 100 mL/hr over 60 Minutes Intravenous Every 6 hours 03/02/16 1424 03/04/16 0803   02/29/16 1000  fluconazole (DIFLUCAN) IVPB 200 mg     200 mg 100 mL/hr over 60 Minutes Intravenous Every 24 hours 02/29/16 0847 03/02/16 1111   02/24/16 1000  ceFAZolin (ANCEF) IVPB 2g/100 mL premix    Comments:  On call to OR.   2 g 200 mL/hr over 30 Minutes Intravenous To ShortStay Surgical 02/24/16 0848 02/24/16 1215   02/18/16 1000  fluconazole (DIFLUCAN) IVPB 200 mg  Status:  Discontinued     200 mg 100 mL/hr over 60 Minutes Intravenous Every 24 hours 02/18/16 0917 02/29/16 0847   02/17/16 2200  erythromycin 250 mg in sodium chloride 0.9 % 100 mL IVPB  Status:  Discontinued     250 mg 100 mL/hr over 60 Minutes Intravenous Every 12 hours 02/17/16 1720 02/17/16 1853   02/17/16  2100  erythromycin 250 mg in sodium chloride 0.9 % 100 mL IVPB  Status:  Discontinued     250 mg 100 mL/hr over 60 Minutes Intravenous Every 12 hours 02/17/16 1853 02/20/16 0737      Medications: Scheduled Meds: . antiseptic oral rinse  7 mL Mouth Rinse BID  . diltiazem  60 mg Per Tube Q6H  . dronabinol  2.5 mg Oral QAC lunch  . enoxaparin (LOVENOX) injection  40 mg Subcutaneous Q24H  .  HYDROmorphone (DILAUDID) injection  0.5 mg Intravenous Once  . insulin aspart  0-9 Units Subcutaneous Q4H  . levothyroxine  50 mcg Oral QAC breakfast  . lidocaine  1 patch Transdermal Q24H  . metoprolol tartrate  50 mg Per Tube BID  . ondansetron  4 mg Intravenous Q6H  . pantoprazole sodium  40 mg Per Tube BID  . pregabalin  75 mg Oral BID  . simethicone  80 mg Oral QID  . sucralfate  1 g Oral TID WC & HS  . warfarin  2 mg Oral ONCE-1800   . Warfarin - Pharmacist Dosing Inpatient   Does not apply q1800   Continuous Infusions: . feeding supplement (VITAL 1.5 CAL) 1,000 mL (03/12/16 1128)   PRN Meds:.acetaminophen **OR** acetaminophen, alum & mag hydroxide-simeth, LORazepam, sodium chloride flush  Assessment/Plan: Patient Active Problem List   Diagnosis Date Noted  . Feeding difficulties   . Nausea with vomiting   . Surgical site Hematoma 03/02/2016  . No blood products 02/27/2016  . Nausea and vomiting 02/19/2016  . AKI (acute kidney injury) ([place]) 02/17/2016  . S/P partial gastrectomy 02/17/2016  . Nausea & vomiting 02/17/2016  . Post gastrectomy syndrome   . Erosive esophagitis   . Uncontrollable vomiting   . Urinary tract infectious disease   . Gastroesophageal reflux disease with esophagitis   . Esophagitis   . Pressure ulcer 02/04/2016  . Hypernatremia 02/04/2016  . Hypokalemia 02/04/2016  . Dehydration 02/04/2016  . Intractable nausea and vomiting 02/03/2016  . Gastric cancer ([place]) 01/15/2016  . Persistent atrial fibrillation ([place]) 01/15/2016  . Protein-calorie malnutrition, moderate ([place]) 12/08/2015  . B12 deficiency 11/24/2015  . Iron deficiency anemia due to chronic blood loss 11/24/2015  . Refusal of blood transfusions as patient is Jehovah's Witness   . Adenocarcinoma of stomach ([place]) 11/11/2015  . CKD (chronic kidney disease), stage III 11/10/2015  . Vitamin B12 deficiency 11/09/2015  . Right knee pain 11/08/2015  . Peripheral nerve disease ([place]) 07/18/2015  . Long term current use of anticoagulant therapy 09/25/2014  . Rupture patellar tendon 07/29/2014  . Essential hypertension 06/04/2014  . Cardiac pacemaker (06/2010) 08/26/2012  . S/P right TKA 02/07/2012  . FHx/o Colon Cancer 07/27/2011  . Aortic stenosis, moderate 07/09/2011  . S/P repair of patent ductus arteriosus 04/22/2011  . Atrial fibrillation ([place]) Chadsvasc 5 12/02/2010  . Renal Cell Cancer ( 2003) 11/17/2007  . Hypothyroidism  11/17/2007  . Gastroparesis 11/17/2007   s/p Procedure(s): OPEN J TUBE 02/24/2016 -when restart feeds, go up a bit. Add simethicone.   Trying marinol for nausea.       LOS: 35 days   Megean Fabio, [facility]

## 2016-03-12 NOTE — Progress Notes (Signed)
Daily Rounding Note  03/12/2016, 8:37 AM  LOS: 22 days   SUBJECTIVE:      Pt having worse day: vomited this AM.    At present RD is attempting to convert 24 hour cycle of tube feedings to nocturnal in step wise progression.  At present has 4 hour tube feeding holiday and increase in standing rate when TF running.  Pt having increased gas and abdominal cramping at TF reinitiated at higher rates.   Per RD note 7/5 goal rate is 55/hour over 20 hour period.  Today's orders: "After tube feeds are off for 4 hours, restart at 60 mL for 9 hours, then 65 mL for 9 hours, then off for 8 hours. Then go to 70 for 5 hours, 75 for 5 hours, then 80 for 4 hours. Then off for 10 hours".  Family putting up resistance to the increased rates.      OBJECTIVE:         Vital signs in last 24 hours:    Temp:  [98.1 F (36.7 C)-98.4 F (36.9 C)] 98.1 F (36.7 C) (07/07 0626) Pulse Rate:  [75-97] 95 (07/07 0626) Resp:  [18] 18 (07/07 0626) BP: (128-156)/(62-82) 136/82 mmHg (07/07 0626) SpO2:  [97 %-100 %] 97 % (07/07 0626) Weight:  [61.871 kg (136 lb 6.4 oz)] 61.871 kg (136 lb 6.4 oz) (07/07 0626) Last BM Date: 03/11/16 Filed Weights   03/10/16 0437 03/11/16 0424 03/12/16 0626  Weight: 64.456 kg (142 lb 1.6 oz) 62.188 kg (137 lb 1.6 oz) 61.871 kg (136 lb 6.4 oz)   General: looks less engaged and more lethargic   Heart: RRR Chest: clear bil Abdomen: soft, active BS.  ND.  Some tenderness in epigastric area.     Extremities: no CCE Neuro/Psych:  Oriented x 3.  Lethargic.  Moves all 4s.    Intake/Output from previous day: 07/06 0701 - 07/07 0700 In: 710 [P.O.:690; I.V.:20] Out: 676 [Urine:675; Stool:1]  Intake/Output this shift:    Lab Results:  Recent Labs  03/10/16 0500 03/11/16 0415 03/12/16 0425  WBC 5.2 5.8 6.4  HGB 9.6* 9.9* 10.1*  HCT 32.0* 32.4* 34.1*  PLT 268 270 249   BMET  Recent Labs  03/10/16 0500 03/11/16 0415  03/12/16 0425  NA 144 145 142  K 3.3* 3.6 3.6  CL 111 111 109  CO2 26 27 27   GLUCOSE 114* 114* 115*  BUN 9 13 14   CREATININE 0.66 0.68 0.67  CALCIUM 8.4* 8.5* 8.3*   LFT No results for input(s): PROT, ALBUMIN, AST, ALT, ALKPHOS, BILITOT, BILIDIR, IBILI in the last 72 hours. PT/INR No results for input(s): LABPROT, INR in the last 72 hours. Hepatitis Panel No results for input(s): HEPBSAG, HCVAB, HEPAIGM, HEPBIGM in the last 72 hours.  Studies/Results: No results found.   Scheduled Meds: . antiseptic oral rinse  7 mL Mouth Rinse BID  . diltiazem  60 mg Per Tube Q6H  . dronabinol  2.5 mg Oral QAC lunch  . enoxaparin (LOVENOX) injection  40 mg Subcutaneous Q24H  . famotidine  20 mg Per Tube BID  .  HYDROmorphone (DILAUDID) injection  0.5 mg Intravenous Once  . insulin aspart  0-9 Units Subcutaneous Q4H  . levothyroxine  50 mcg Oral QAC breakfast  . lidocaine  1 patch Transdermal Q24H  . metoprolol tartrate  50 mg Per Tube BID  . ondansetron  4 mg Intravenous Q6H  . pantoprazole sodium  40 mg  Per Tube BID  . pregabalin  75 mg Oral BID  . sucralfate  1 g Oral TID WC & HS   Continuous Infusions: . feeding supplement (VITAL 1.5 CAL) 1,000 mL (03/11/16 1823)   PRN Meds:.acetaminophen **OR** acetaminophen, alum & mag hydroxide-simeth, LORazepam, sodium chloride flush  ASSESMENT:   * Chronic N/V and intolerance to po. Severe reflux esophagitis in pt s/p 01/15/16 subtotal gastrectomy for adenocarcinoma. Feeding J tube placed 02/24/16. Added scheduled Zofran, Marinol 7/7 to ongoing Protonix and Carafate po. Erythromycin discontinued 7/6.    *  Protein calorie malnutrition.    PLAN   *  TF mgt per surgery and RD.  Continue max therapy for post gastrectomy reflux.  If sxs ongoing by Sunday, would drop the Dronabinol.   *  Since the reflux is bile reflux and not acid reflux, wonder if she needs the Pepcid and BID Protonix.  I am stopping Pepcid.  Will d/w Dr Henrene Pastor if we ought  to change PPI to once daily (there is little remaining stomach and thus very few proton pumps to target with PPI).     [name]  03/12/2016, 8:37 AM Pager: [phone]  GI ATTENDING  Interval history data reviewed. Agree with interval progress note. Still having difficulties. On maximal medical therapy. Continue IV PPI, sucralfate slurries 4 times a day as tolerated, elevated head of bed, and tube feeding per surgery and dietary. Not much else to offer at this time aside from tincture of time. We will sign off at this time but could be available in the future for specific questions or problems.  Docia Chuck. Geri Seminole., M.D. [facility] Division of Gastroenterology

## 2016-03-12 NOTE — Progress Notes (Signed)
[facility] Progress Note  Patient: [name] [id number]   PCP: [name], MD DOB: August 18, 1934   DOA: 02/17/2016   DOS: 03/12/2016   Date of Service: the patient was seen and examined on 03/12/2016  Subjective:  Seen with 2 daughters at bedside, Still complaining about nausea, tolerated about 5 bites of her grits this morning. Family reported that she is having multiple BMs at night, they do not want to feeding to be increased beyond 60 mL/hour. If not able to increase TF can send her home on continuous tube feeding, general surgery please advise.  Brief hospital course: Pt. with PMH of A. fib, CKD,HTN, gastric cancer S/P proximal gastrectomy 01/2016; admitted on 02/17/2016, with complaint of vomiting, was found to have suspected esophagitis. Patient was given IV Diflucan, IV PPI and was started on TPN. Also started on IV erythromycin for motility. With initial treatment the patient was able to tolerate by mouth pills, but no food intake and therefore it was decided to place a J-tube, procedure done on 02/24/2016. Since the procedure pt has developed ileus which Appears to be improving. She also has anemia which appears to be dilutional and refusing blood transfusion. CT abdomen shows evidence of hematoma with any active bleeding. No other acute abnormality. Still with frequent nausea and vomiting Tolerating Tube feeds via J tube 7/4: [facility] reconsulted  Assessment and Plan:  1. Gastric adenocarcinoma/Recent subtotal gastrectomy for gastric adenocarcinoma May 2017 -with Intractable nausea and vomiting since mid May -last EGD 6/1 Dr.Stark with Grade D esophagitis and small non bleeding gastric ulcers at anastomoses -Tolerating TFs -gradually being advanced, goal of 45cc/hr, now at 40cc/hr -Weaned off TNA  -recurrent admissions with post op N/V -S/P J-tube placement 02/24/2016. -Has Been on IV PPI, Pepcid, also completed 2 weeks of IV FLuconazole for Oral  thrush. -Discussed with [name], okay to discontinue the erythromycin and minimize medications. -GI reconsulted, recommended discontinue Phenergan, schedule the Zofran and Carafate, keep head elevated. -Per [name] cyclical to feeding, family reported multiple bowel movement when her to feel increased area -If not able to increase can send her home on continuous tube feeding, general surgery please advise.  2. Paroxysmal A. Fib/Pacemaker implant -Chadsvasc 5. On warfarin at home -Currently rate controlled. Continue Cardizem when necessary,  -On liquid metoprolol via J tube -Per [name], okay to resume warfarin, will restart.  3. Hypothyroidism. -on IV synthroid, will switch to oral Synthroid see if she tolerates them.  4. Postoperative blood loss anemia. -Surgical site hematoma, stable -No evidence of active bleeding and component of hemodilution -Patient refused blood transfusion,  -S/P IV Feraheme 5101, [place] Aranesp 1 as well as injection B-12 on 02/27/2016, after discussion with patient's hematology Dr Irene Limbo. -CT abdomen showed evidence of small surgical site hematoma what appears to be stable at present. -off anticoagulation, ok to restart Warfarin d/w CCS, but unable to take PO   5. Dehydration. -Mild renal insufficiency earlier. -on TNA, resolved  6. Adult failure to thrive -Currently on tube feeding.  7. Mild Volume overload -Given Lasix, this is resolved continue Lasix as needed.  DVT Prophylaxis: on prophylactic Lovenox.  Advance goals of care discussion: DNR/DNI Family Communication: Daughters (Beverlee Nims Valier and [name]) at bedside Disposition: Discharge to home with home health when improved  Consultants: Gen. surgery Procedures: J-tube placement, PICC line placement, TPN  Antibiotics: Anti-infectives    Start     Dose/Rate Route Frequency Ordered Stop   03/08/16 2200  erythromycin 250 mg  in sodium chloride 0.9 % 100 mL IVPB  Status:  Discontinued     250  mg 100 mL/hr over 60 Minutes Intravenous Every 12 hours 03/08/16 1058 03/10/16 1209   03/05/16 1600  erythromycin 250 mg in sodium chloride 0.9 % 100 mL IVPB  Status:  Discontinued     250 mg 100 mL/hr over 60 Minutes Intravenous Every 6 hours 03/05/16 1120 03/08/16 1058   03/04/16 2200  erythromycin 250 mg in sodium chloride 0.9 % 100 mL IVPB  Status:  Discontinued     250 mg 100 mL/hr over 60 Minutes Intravenous Every 12 hours 03/04/16 0803 03/04/16 1306   03/04/16 1400  erythromycin 250 mg in sodium chloride 0.9 % 100 mL IVPB  Status:  Discontinued     250 mg 100 mL/hr over 60 Minutes Intravenous Every 8 hours 03/04/16 1306 03/05/16 1120   03/02/16 1530  erythromycin 250 mg in sodium chloride 0.9 % 100 mL IVPB  Status:  Discontinued     250 mg 100 mL/hr over 60 Minutes Intravenous Every 6 hours 03/02/16 1424 03/04/16 0803   02/29/16 1000  fluconazole (DIFLUCAN) IVPB 200 mg     200 mg 100 mL/hr over 60 Minutes Intravenous Every 24 hours 02/29/16 0847 03/02/16 1111   02/24/16 1000  ceFAZolin (ANCEF) IVPB 2g/100 mL premix    Comments:  On call to OR.   2 g 200 mL/hr over 30 Minutes Intravenous To ShortStay Surgical 02/24/16 0848 02/24/16 1215   02/18/16 1000  fluconazole (DIFLUCAN) IVPB 200 mg  Status:  Discontinued     200 mg 100 mL/hr over 60 Minutes Intravenous Every 24 hours 02/18/16 0917 02/29/16 0847   02/17/16 2200  erythromycin 250 mg in sodium chloride 0.9 % 100 mL IVPB  Status:  Discontinued     250 mg 100 mL/hr over 60 Minutes Intravenous Every 12 hours 02/17/16 1720 02/17/16 1853   02/17/16 2100  erythromycin 250 mg in sodium chloride 0.9 % 100 mL IVPB  Status:  Discontinued     250 mg 100 mL/hr over 60 Minutes Intravenous Every 12 hours 02/17/16 1853 02/20/16 0737        Intake/Output Summary (Last 24 hours) at 03/12/16 1210 Last data filed at 03/12/16 0627  Gross per 24 hour  Intake    420 ml  Output    326 ml  Net     94 ml   Filed Weights   03/10/16 0437  03/11/16 0424 03/12/16 0626  Weight: 64.456 kg (142 lb 1.6 oz) 62.188 kg (137 lb 1.6 oz) 61.871 kg (136 lb 6.4 oz)    Objective: Physical Exam: Filed Vitals:   03/11/16 1941 03/12/16 0626 03/12/16 0954 03/12/16 1122  BP: 128/62 136/82 144/65 135/58  Pulse: 75 95 80 77  Temp: 98.2 F (36.8 C) 98.1 F (36.7 C)  97.6 F (36.4 C)  TempSrc: Oral Oral  Oral  Resp: 18 18  20   Height:      Weight:  61.871 kg (136 lb 6.4 oz)    SpO2: 100% 97%  98%    General: Alert, Awake, frail elderly woman, chronically ill appearing Eyes: PERRL, Conjunctiva normal ENT: Oral Mucosa clear moist. Cardiovascular: S1 and S2 Present, systolic murmur Respiratory:  Fine basilar crackles Abdomen: Bowel Sound present, Soft and mild tenderness, surgical scar noted, J tube noted Skin: no redness, no Rash  Extremities: no Pedal edema, no calf tenderness Neurologic: Grossly no focal neuro deficit. Bilaterally Equal motor strength  Data Reviewed: CBC:  Recent Labs Lab 03/08/16 0514 03/09/16 0420 03/10/16 0500 03/11/16 0415 03/12/16 0425  WBC 6.0 4.8 5.2 5.8 6.4  HGB 8.7* 9.6* 9.6* 9.9* 10.1*  HCT 28.9* 32.0* 32.0* 32.4* 34.1*  MCV 101.4* 102.2* 103.6* 104.2* 104.9*  PLT 238 199 268 270 0000000   Basic Metabolic Panel:  Recent Labs Lab 03/07/16 0445 03/09/16 0420 03/10/16 0500 03/11/16 0415 03/12/16 0425  NA 140 143 144 145 142  K 3.6 3.0* 3.3* 3.6 3.6  CL 112* 113* 111 111 109  CO2 23 24 26 27 27   GLUCOSE 93 104* 114* 114* 115*  BUN 12 10 9 13 14   CREATININE 0.72 0.69 0.66 0.68 0.67  CALCIUM 8.2* 8.3* 8.4* 8.5* 8.3*  MG  --   --   --   --  1.7    Liver Function Tests: No results for input(s): AST, ALT, ALKPHOS, BILITOT, PROT, ALBUMIN in the last 168 hours. No results for input(s): LIPASE, AMYLASE in the last 168 hours. No results for input(s): AMMONIA in the last 168 hours. Coagulation Profile:  Recent Labs Lab 03/06/16 1040 03/07/16 0445 03/08/16 0514  INR 1.30 1.29 1.22    Cardiac Enzymes: No results for input(s): CKTOTAL, CKMB, CKMBINDEX, TROPONINI in the last 168 hours. BNP (last 3 results) No results for input(s): PROBNP in the last 8760 hours.  CBG:  Recent Labs Lab 03/11/16 1939 03/11/16 2356 03/12/16 0326 03/12/16 0804 03/12/16 1120  GLUCAP 116* 120* 101* 151* 142*    Studies: No results found.   Scheduled Meds: . antiseptic oral rinse  7 mL Mouth Rinse BID  . diltiazem  60 mg Per Tube Q6H  . dronabinol  2.5 mg Oral QAC lunch  . enoxaparin (LOVENOX) injection  40 mg Subcutaneous Q24H  .  HYDROmorphone (DILAUDID) injection  0.5 mg Intravenous Once  . insulin aspart  0-9 Units Subcutaneous Q4H  . levothyroxine  50 mcg Oral QAC breakfast  . lidocaine  1 patch Transdermal Q24H  . metoprolol tartrate  50 mg Per Tube BID  . ondansetron  4 mg Intravenous Q6H  . pantoprazole sodium  40 mg Per Tube BID  . pregabalin  75 mg Oral BID  . sucralfate  1 g Oral TID WC & HS  . warfarin  2 mg Oral ONCE-1800  . Warfarin - Pharmacist Dosing Inpatient   Does not apply q1800   Continuous Infusions: . feeding supplement (VITAL 1.5 CAL) 1,000 mL (03/12/16 1128)   PRN Meds: acetaminophen **OR** acetaminophen, alum & mag hydroxide-simeth, LORazepam, sodium chloride flush  Time spent: 30 minutes  Author: Birdie Hopes Pager: [phone] 03/12/2016, 12:10 PM If 7PM-7AM, please contact night-coverage at [email or web address], password [facility]

## 2016-03-13 LAB — BASIC METABOLIC PANEL
Anion gap: 6 (ref 5–15)
BUN: 14 mg/dL (ref 6–20)
CO2: 28 mmol/L (ref 22–32)
CREATININE: 0.7 mg/dL (ref 0.44–1.00)
Calcium: 8.6 mg/dL — ABNORMAL LOW (ref 8.9–10.3)
Chloride: 107 mmol/L (ref 101–111)
GFR calc Af Amer: >60 mL/min (ref >60)
GLUCOSE: 107 mg/dL — AB (ref 65–99)
POTASSIUM: 3.6 mmol/L (ref 3.5–5.1)
SODIUM: 141 mmol/L (ref 135–145)

## 2016-03-13 LAB — PROTIME-INR
INR: 1.11 (ref 0.00–1.49)
PROTHROMBIN TIME: 14.5 s (ref 11.6–15.2)

## 2016-03-13 LAB — GLUCOSE, CAPILLARY
GLUCOSE-CAPILLARY: 114 mg/dL — AB (ref 65–99)
Glucose-Capillary: 101 mg/dL — ABNORMAL HIGH (ref 65–99)
Glucose-Capillary: 112 mg/dL — ABNORMAL HIGH (ref 65–99)
Glucose-Capillary: 120 mg/dL — ABNORMAL HIGH (ref 65–99)
Glucose-Capillary: 129 mg/dL — ABNORMAL HIGH (ref 65–99)
Glucose-Capillary: 97 mg/dL (ref 65–99)

## 2016-03-13 LAB — CBC
HEMATOCRIT: 35.7 % — AB (ref 36.0–46.0)
HEMOGLOBIN: 10.5 g/dL — AB (ref 12.0–15.0)
MCH: 30.7 pg (ref 26.0–34.0)
MCHC: 29.4 g/dL — AB (ref 30.0–36.0)
MCV: 104.4 fL — ABNORMAL HIGH (ref 78.0–100.0)
Platelets: 258 10*3/uL (ref 150–400)
RBC: 3.42 MIL/uL — AB (ref 3.87–5.11)
RDW: 19 % — ABNORMAL HIGH (ref 11.5–15.5)
WBC: 6.1 10*3/uL (ref 4.0–10.5)

## 2016-03-13 MED ORDER — VITAL 1.5 CAL PO LIQD
1000.0000 mL | ORAL | Status: DC
  Administered 2016-03-13: 1000 mL
  Filled 2016-03-13 (×2): qty 1000

## 2016-03-13 MED ORDER — POTASSIUM CHLORIDE 20 MEQ/15ML (10%) PO SOLN
40.0000 meq | Freq: Once | ORAL | Status: AC
  Administered 2016-03-13: 40 meq via ORAL
  Filled 2016-03-13: qty 30

## 2016-03-13 MED ORDER — WARFARIN SODIUM 2 MG PO TABS
2.0000 mg | ORAL_TABLET | Freq: Once | ORAL | Status: AC
  Administered 2016-03-13: 2 mg via ORAL
  Filled 2016-03-13 (×2): qty 1

## 2016-03-13 MED ORDER — FUROSEMIDE 10 MG/ML IJ SOLN
40.0000 mg | Freq: Once | INTRAMUSCULAR | Status: AC
  Administered 2016-03-13: 40 mg via INTRAVENOUS
  Filled 2016-03-13: qty 4

## 2016-03-13 MED ORDER — JEVITY 1.2 CAL PO LIQD: 1000.0000 mL | ORAL | Status: DC

## 2016-03-13 MED ORDER — POTASSIUM CHLORIDE 20 MEQ PO PACK: 40.0000 meq | PACK | Freq: Once | ORAL | Status: DC

## 2016-03-13 NOTE — Progress Notes (Signed)
18 Days Post-Op  Subjective: Doing well some soupy drainage under steri strips.  Objective: Vital signs in last 24 hours: Temp:  [97.6 F (36.4 C)-98.3 F (36.8 C)] 98.2 F (36.8 C) (07/08 0420) Pulse Rate:  [77-80] 77 (07/08 0420) Resp:  [11-20] 11 (07/08 0420) BP: (118-144)/(56-65) 122/61 mmHg (07/08 0420) SpO2:  [98 %-100 %] 100 % (07/08 0420) Weight:  [66.996 kg (147 lb 11.2 oz)] 66.996 kg (147 lb 11.2 oz) (07/07 1256) Last BM Date: 03/11/16 360 PO 427 TF recorded Urine not recorded' BM x 1 recorded Afebrile, VSS Labs OK Intake/Output from previous day: 07/07 0701 - 07/08 0700 In: 907 [P.O.:360; I.V.:120; NG/GT:427] Out: -  Intake/Output this shift:    General appearance: alert, cooperative and no distress GI: soft, incision soupy under steri strips, clean and I see no drainage now.  J tube OK  Lab Results:   Recent Labs  03/12/16 0425 03/13/16 0525  WBC 6.4 6.1  HGB 10.1* 10.5*  HCT 34.1* 35.7*  PLT 249 258    BMET  Recent Labs  03/12/16 0425 03/13/16 0525  NA 142 141  K 3.6 3.6  CL 109 107  CO2 27 28  GLUCOSE 115* 107*  BUN 14 14  CREATININE 0.67 0.70  CALCIUM 8.3* 8.6*   PT/INR  Recent Labs  03/13/16 0525  LABPROT 14.5  INR 1.11    No results for input(s): AST, ALT, ALKPHOS, BILITOT, PROT, ALBUMIN in the last 168 hours.   Lipase     Component Value Date/Time   LIPASE 48 02/17/2016 1509     Studies/Results: No results found.  Medications: . antiseptic oral rinse  7 mL Mouth Rinse BID  . diltiazem  60 mg Per Tube Q6H  . dronabinol  2.5 mg Oral QAC lunch  . enoxaparin (LOVENOX) injection  40 mg Subcutaneous Q24H  .  HYDROmorphone (DILAUDID) injection  0.5 mg Intravenous Once  . insulin aspart  0-9 Units Subcutaneous Q4H  . levothyroxine  50 mcg Oral QAC breakfast  . lidocaine  1 patch Transdermal Q24H  . metoprolol tartrate  50 mg Per Tube BID  . ondansetron  4 mg Intravenous Q6H  . pantoprazole sodium  40 mg Per Tube BID   . pregabalin  75 mg Oral BID  . simethicone  80 mg Oral QID  . sucralfate  1 g Oral TID WC & HS  . Warfarin - Pharmacist Dosing Inpatient   Does not apply q1800   . feeding supplement (VITAL 1.5 CAL) 1,000 mL (03/12/16 1700)    Assessment/Plan Gastric cancer S/p open gastrectomy 02/05/16, esophagitis PO intolerance with Jejunostomy tube placement 02/24/16, [name] Multiple medical issues DNR FEN: Feeding tube/full liquids  Goal is 55 ml per hour ID:  None DVT:  Lovneox/coumadin     Plan:  Currently at 45 ml/hr on tube feeds.  Note says family would not allow them to increase yesterday, and now they are asking for it to go back to 65 ml/hr.  I told them note from 03/10/16 that the goal is 55 ml/hr per Harless Litten RD.    LOS: 23 days    Marcella Dunnaway 03/13/2016 [phone]

## 2016-03-13 NOTE — Progress Notes (Signed)
ANTICOAGULATION CONSULT NOTE - Follow Up Consult  Pharmacy Consult for warfarin Indication: atrial fibrillation   Labs:  Recent Labs  03/11/16 0415 03/12/16 0425 03/13/16 0525  HGB 9.9* 10.1* 10.5*  HCT 32.4* 34.1* 35.7*  PLT 270 249 258  LABPROT  --   --  14.5  INR  --   --  1.11  CREATININE 0.68 0.67 0.70    Assessment: 82 yo presenting with n/v. Pharmacy consulted to dose/monitor heparin gtt for Hx Afib CVR on diltiazem. Pt  post op 6/20 gastric surgery and  Jtube placed.  Heparin drip stopped and has been on enoxaparin for VTE px.  Warfarin resumed 7/7. CBC stable and INR 1.11 today. Warfarin 2mg  given 7/7 PM.  Attempting cyclical feeds per nursing orders- however current rate appears to be 55mL/hr continuous.   Goal of Therapy:  INR 2-3   Plan:  Give Warfarin 2mg  x1 again today Daily PT/INR Monitor s/s bleeding  Sloan Leiter, PharmD, BCPS Clinical Pharmacist [phone]  03/13/2016 8:52 AM

## 2016-03-13 NOTE — Progress Notes (Signed)
[facility] Progress Note  Patient: [name] [id number]   PCP: [name], MD DOB: 03-Mar-1934   DOA: 02/17/2016   DOS: 03/13/2016   Date of Service: the patient was seen and examined on 03/13/2016  Subjective:  Denies any nausea, vomiting or bloating today. Tube feeding rate at 45 mL per hour, discussed with general surgery, although last nutrition to have higher calorie tube feed. As she likely she will not tolerate 80 mL/hour, nutrition to see for higher caloric formula to suit cyclic tube feeding  Brief hospital course: Pt. with PMH of A. fib, CKD,HTN, gastric cancer S/P proximal gastrectomy 01/2016; admitted on 02/17/2016, with complaint of vomiting, was found to have suspected esophagitis. Patient was given IV Diflucan, IV PPI and was started on TPN. Also started on IV erythromycin for motility. With initial treatment the patient was able to tolerate by mouth pills, but no food intake and therefore it was decided to place a J-tube, procedure done on 02/24/2016. Since the procedure pt has developed ileus which Appears to be improving. She also has anemia which appears to be dilutional and refusing blood transfusion. CT abdomen shows evidence of hematoma with any active bleeding. No other acute abnormality. Still with frequent nausea and vomiting Tolerating Tube feeds via J tube 7/4: [facility] reconsulted  Assessment and Plan:  1. Gastric adenocarcinoma/Recent subtotal gastrectomy for gastric adenocarcinoma May 2017 -with Intractable nausea and vomiting since mid May -last EGD 6/1 Dr.Stark with Grade D esophagitis and small non bleeding gastric ulcers at anastomoses -Tolerating TFs -gradually being advanced, goal of 45cc/hr, now at 40cc/hr -Weaned off TNA  -recurrent admissions with post op N/V -S/P J-tube placement 02/24/2016. -Has Been on IV PPI, Pepcid, also completed 2 weeks of IV FLuconazole for Oral thrush. -Discussed with [name], okay to discontinue the  erythromycin and minimize medications. -GI reconsulted, recommended discontinue Phenergan, schedule the Zofran and Carafate, keep head elevated. -Per [name] cyclical to feeding, family reported multiple bowel movement when her to feel increased area -Nutrition to evaluate for higher caloric formula, to be suitable for cyclic 2 feeding  2. Paroxysmal A. Fib/Pacemaker implant -Chadsvasc 5. On warfarin at home -Currently rate controlled. Continue Cardizem when necessary,  -On liquid metoprolol via J tube -Per [name], okay to resume warfarin, will restart.  3. Hypothyroidism. -on IV synthroid, will switch to oral Synthroid see if she tolerates them.  4. Postoperative blood loss anemia. -Surgical site hematoma, stable -No evidence of active bleeding and component of hemodilution -Patient refused blood transfusion,  -S/P IV Feraheme 5101, [place] Aranesp 1 as well as injection B-12 on 02/27/2016, after discussion with patient's hematology Dr Irene Limbo. -CT abdomen showed evidence of small surgical site hematoma what appears to be stable at present. -off anticoagulation, ok to restart Warfarin d/w CCS, but unable to take PO   5. Dehydration. -Mild renal insufficiency earlier. -on TNA, resolved  6. Adult failure to thrive -Currently on tube feeding.  7. Mild Volume overload -Given Lasix, this is resolved continue Lasix as needed.  DVT Prophylaxis: on prophylactic Lovenox.  Advance goals of care discussion: DNR/DNI Family Communication: Daughters (Diana POA and [name]) at bedside Disposition: Discharge to home with home health when improved  Consultants: Gen. surgery Procedures: J-tube placement, PICC line placement, TPN  Antibiotics: Anti-infectives    Start     Dose/Rate Route Frequency Ordered Stop   03/08/16 2200  erythromycin 250 mg in sodium chloride 0.9 % 100 mL IVPB  Status:  Discontinued  250 mg 100 mL/hr over 60 Minutes Intravenous Every 12 hours 03/08/16 1058  03/10/16 1209   03/05/16 1600  erythromycin 250 mg in sodium chloride 0.9 % 100 mL IVPB  Status:  Discontinued     250 mg 100 mL/hr over 60 Minutes Intravenous Every 6 hours 03/05/16 1120 03/08/16 1058   03/04/16 2200  erythromycin 250 mg in sodium chloride 0.9 % 100 mL IVPB  Status:  Discontinued     250 mg 100 mL/hr over 60 Minutes Intravenous Every 12 hours 03/04/16 0803 03/04/16 1306   03/04/16 1400  erythromycin 250 mg in sodium chloride 0.9 % 100 mL IVPB  Status:  Discontinued     250 mg 100 mL/hr over 60 Minutes Intravenous Every 8 hours 03/04/16 1306 03/05/16 1120   03/02/16 1530  erythromycin 250 mg in sodium chloride 0.9 % 100 mL IVPB  Status:  Discontinued     250 mg 100 mL/hr over 60 Minutes Intravenous Every 6 hours 03/02/16 1424 03/04/16 0803   02/29/16 1000  fluconazole (DIFLUCAN) IVPB 200 mg     200 mg 100 mL/hr over 60 Minutes Intravenous Every 24 hours 02/29/16 0847 03/02/16 1111   02/24/16 1000  ceFAZolin (ANCEF) IVPB 2g/100 mL premix    Comments:  On call to OR.   2 g 200 mL/hr over 30 Minutes Intravenous To ShortStay Surgical 02/24/16 0848 02/24/16 1215   02/18/16 1000  fluconazole (DIFLUCAN) IVPB 200 mg  Status:  Discontinued     200 mg 100 mL/hr over 60 Minutes Intravenous Every 24 hours 02/18/16 0917 02/29/16 0847   02/17/16 2200  erythromycin 250 mg in sodium chloride 0.9 % 100 mL IVPB  Status:  Discontinued     250 mg 100 mL/hr over 60 Minutes Intravenous Every 12 hours 02/17/16 1720 02/17/16 1853   02/17/16 2100  erythromycin 250 mg in sodium chloride 0.9 % 100 mL IVPB  Status:  Discontinued     250 mg 100 mL/hr over 60 Minutes Intravenous Every 12 hours 02/17/16 1853 02/20/16 0737        Intake/Output Summary (Last 24 hours) at 03/13/16 1319 Last data filed at 03/13/16 1229  Gross per 24 hour  Intake    810 ml  Output    176 ml  Net    634 ml   Filed Weights   03/11/16 0424 03/12/16 0626 03/12/16 1256  Weight: 62.188 kg (137 lb 1.6 oz) 61.871 kg  (136 lb 6.4 oz) 66.996 kg (147 lb 11.2 oz)    Objective: Physical Exam: Filed Vitals:   03/12/16 1122 03/12/16 1256 03/13/16 0420 03/13/16 1100  BP: 135/58 118/56 122/61 136/69  Pulse: 77 79 77 94  Temp: 97.6 F (36.4 C) 98.3 F (36.8 C) 98.2 F (36.8 C) 98 F (36.7 C)  TempSrc: Oral Oral Oral Oral  Resp: 20 18 11    Height:  5\' 1"  (1.549 m)    Weight:  66.996 kg (147 lb 11.2 oz)    SpO2: 98% 100% 100% 97%    General: Alert, Awake, frail elderly woman, chronically ill appearing Eyes: PERRL, Conjunctiva normal ENT: Oral Mucosa clear moist. Cardiovascular: S1 and S2 Present, systolic murmur Respiratory:  Fine basilar crackles Abdomen: Bowel Sound present, Soft and mild tenderness, surgical scar noted, J tube noted Skin: no redness, no Rash  Extremities: no Pedal edema, no calf tenderness Neurologic: Grossly no focal neuro deficit. Bilaterally Equal motor strength  Data Reviewed: CBC:  Recent Labs Lab 03/09/16 0420 03/10/16 0500 03/11/16 0415 03/12/16  0425 03/13/16 0525  WBC 4.8 5.2 5.8 6.4 6.1  HGB 9.6* 9.6* 9.9* 10.1* 10.5*  HCT 32.0* 32.0* 32.4* 34.1* 35.7*  MCV 102.2* 103.6* 104.2* 104.9* 104.4*  PLT 199 268 270 249 0000000   Basic Metabolic Panel:  Recent Labs Lab 03/09/16 0420 03/10/16 0500 03/11/16 0415 03/12/16 0425 03/13/16 0525  NA 143 144 145 142 141  K 3.0* 3.3* 3.6 3.6 3.6  CL 113* 111 111 109 107  CO2 24 26 27 27 28   GLUCOSE 104* 114* 114* 115* 107*  BUN 10 9 13 14 14   CREATININE 0.69 0.66 0.68 0.67 0.70  CALCIUM 8.3* 8.4* 8.5* 8.3* 8.6*  MG  --   --   --  1.7  --     Liver Function Tests: No results for input(s): AST, ALT, ALKPHOS, BILITOT, PROT, ALBUMIN in the last 168 hours. No results for input(s): LIPASE, AMYLASE in the last 168 hours. No results for input(s): AMMONIA in the last 168 hours. Coagulation Profile:  Recent Labs Lab 03/07/16 0445 03/08/16 0514 03/13/16 0525  INR 1.29 1.22 1.11   Cardiac Enzymes: No results for  input(s): CKTOTAL, CKMB, CKMBINDEX, TROPONINI in the last 168 hours. BNP (last 3 results) No results for input(s): PROBNP in the last 8760 hours.  CBG:  Recent Labs Lab 03/12/16 2003 03/13/16 0013 03/13/16 0419 03/13/16 0714 03/13/16 1110  GLUCAP 110* 112* 129* 120* 114*    Studies: No results found.   Scheduled Meds: . antiseptic oral rinse  7 mL Mouth Rinse BID  . diltiazem  60 mg Per Tube Q6H  . dronabinol  2.5 mg Oral QAC lunch  . enoxaparin (LOVENOX) injection  40 mg Subcutaneous Q24H  .  HYDROmorphone (DILAUDID) injection  0.5 mg Intravenous Once  . insulin aspart  0-9 Units Subcutaneous Q4H  . levothyroxine  50 mcg Oral QAC breakfast  . lidocaine  1 patch Transdermal Q24H  . metoprolol tartrate  50 mg Per Tube BID  . ondansetron  4 mg Intravenous Q6H  . pantoprazole sodium  40 mg Per Tube BID  . pregabalin  75 mg Oral BID  . simethicone  80 mg Oral QID  . sucralfate  1 g Oral TID WC & HS  . warfarin  2 mg Oral ONCE-1800  . Warfarin - Pharmacist Dosing Inpatient   Does not apply q1800   Continuous Infusions: . feeding supplement (VITAL 1.5 CAL) 1,000 mL (03/12/16 1700)   PRN Meds: acetaminophen **OR** acetaminophen, alum & mag hydroxide-simeth, LORazepam, sodium chloride flush  Time spent: 30 minutes  Author: Birdie Hopes Pager: [phone] 03/13/2016, 1:19 PM If 7PM-7AM, please contact night-coverage at [email or web address], password [facility]

## 2016-03-13 NOTE — Progress Notes (Signed)
Nutrition Follow-up  DOCUMENTATION CODES:   Non-severe (moderate) malnutrition in context of chronic illness  INTERVENTION:   -Continue Vital 1.5 @ 45 ml/hr via J-tube and increase by 5 ml every 6 hours to goal rate of 55 ml/hr over 20 hour period.   Tube feeding regimen provides 1650 kcal (100% of needs), 74 grams of protein, and 840 ml of H2O.   -Vital 1.5 is the most calorically dense, semi-elemental formula available on formulary. Discussed extensively with RN, pt, and family. Pt and family are agreeable to lower TF rate with longer infusion time to promote tolerance.   NUTRITION DIAGNOSIS:   Malnutrition related to chronic illness, altered GI function as evidenced by mild depletion of body fat, mild depletion of muscle mass, percent weight loss.  Ongoing  GOAL:   Patient will meet greater than or equal to 90% of their needs  Progressing  MONITOR:   Diet advancement, PO intake, Skin, I & O's, Labs, Weight trends  REASON FOR ASSESSMENT:   Consult Enteral/tube feeding initiation and management  ASSESSMENT:   82 y.o. female with a Past Medical History of PAF CAD, tachy/brady syndrome, RLS, GERD, uterine cancer and renal cancer, HTN, HLD, hypothyroidism, gastroparesis, Adenocarcinoma of the stomach s/p partial gastrectomy, ckd, and cva, who presents with nausea vomiting abdominal pain. This seems to be a recurrent issue for patient ever since she underwent partial gastrectomy.   S/p Procedure(s) 02/25/16: OPEN J TUBE (N/A)  RD received consult for TF and initiation and management. Pt transferred to surgical floor on 03/12/16. Case discussed with RN. She reports pt has been having difficulty tolerating TF at higher rate (increased diarrhea and vomiting). RN also reports that there was some miscommunication between providers regarding TF rate. RD has been asked to evaluate for TF rate and potential for higher calorie formula. Vital 1.5 is currently running continuously at 45  ml/hr.   Spoke with pt and two daughters at bedside. Pt is in good spirits today, just finished showering and brushing teeth prior to RD visit. Pt daughters feel encouraged that pt is eating a little bit more (still not significant PO intake; pt consumed a few bites of grits this AM without difficulty). Pt and daughter report that pt has had difficulty tolerating TF at rate of 60 ml/hr and above, with pt has experienced ongoing diarrhea and GI distress .   The majority of the visit was spent discussing options for TF management. Pt and daughters share that the highest rate pt has been able to tolerate is 55 ml/hr. Discussed options with higher rate with shorter infusion time vs lower rate with longer infusion time. Due to pt's complex GI hx, pt would most benefit from a semi-elemental formula (Vital) and current formula is most nutrient dense. Pt and family share that quality of life is most important to pt and are willing to pursue longer infusion time to prevent GI distress. Questions and concerns related to TF regimen answered to pt and family's satisfaction and expressed deep appreciation for RD assistance.  Medications reviewed: noted marinol ordered on 03/12/16.   Per RN, potential discharge over the weekend.   Labs reviewed: CBGS: 112-129.   Diet Order:  Diet full liquid Room service appropriate?: Yes; Fluid consistency:: Thin  Skin:  Reviewed, no issues  Last BM:  03/11/16  Height:   Ht Readings from Last 1 Encounters:  03/12/16 5\' 1"  (1.549 m)    Weight:   Wt Readings from Last 1 Encounters:  03/12/16 147 lb  11.2 oz (66.996 kg)    Ideal Body Weight:  47.7 kg  BMI:  Body mass index is 27.92 kg/(m^2).  Estimated Nutritional Needs:   Kcal:  1600-1800  Protein:  75-90 grams  Fluid:  1.6-1.8 L  EDUCATION NEEDS:   No education needs identified at this time  Amahia Madonia A. Jimmye Norman, RD, LDN, CDE Pager: [phone] After hours Pager: [phone]

## 2016-03-14 LAB — GLUCOSE, CAPILLARY
GLUCOSE-CAPILLARY: 106 mg/dL — AB (ref 65–99)
Glucose-Capillary: 108 mg/dL — ABNORMAL HIGH (ref 65–99)
Glucose-Capillary: 117 mg/dL — ABNORMAL HIGH (ref 65–99)
Glucose-Capillary: 112 mg/dL — ABNORMAL HIGH (ref 65–99)
Glucose-Capillary: 107 mg/dL — ABNORMAL HIGH (ref 65–99)
Glucose-Capillary: 115 mg/dL — ABNORMAL HIGH (ref 65–99)

## 2016-03-14 LAB — BASIC METABOLIC PANEL
ANION GAP: 6 (ref 5–15)
BUN: 14 mg/dL (ref 6–20)
CO2: 30 mmol/L (ref 22–32)
Calcium: 8.3 mg/dL — ABNORMAL LOW (ref 8.9–10.3)
Chloride: 106 mmol/L (ref 101–111)
Creatinine, Ser: 0.85 mg/dL (ref 0.44–1.00)
GFR calc Af Amer: >60 mL/min (ref >60)
GFR calc non Af Amer: >60 mL/min (ref >60)
GLUCOSE: 106 mg/dL — AB (ref 65–99)
POTASSIUM: 3 mmol/L — AB (ref 3.5–5.1)
Sodium: 142 mmol/L (ref 135–145)

## 2016-03-14 LAB — CBC
HEMATOCRIT: 33.5 % — AB (ref 36.0–46.0)
Hemoglobin: 9.9 g/dL — ABNORMAL LOW (ref 12.0–15.0)
MCH: 31.1 pg (ref 26.0–34.0)
MCHC: 29.6 g/dL — ABNORMAL LOW (ref 30.0–36.0)
MCV: 105.3 fL — AB (ref 78.0–100.0)
Platelets: 230 10*3/uL (ref 150–400)
RBC: 3.18 MIL/uL — AB (ref 3.87–5.11)
RDW: 18.7 % — AB (ref 11.5–15.5)
WBC: 6 10*3/uL (ref 4.0–10.5)

## 2016-03-14 LAB — PROTIME-INR
INR: 1.19 (ref 0.00–1.49)
Prothrombin Time: 15.2 seconds (ref 11.6–15.2)

## 2016-03-14 MED ORDER — DILTIAZEM HCL ER COATED BEADS 180 MG PO CP24
180.0000 mg | ORAL_CAPSULE | Freq: Every day | ORAL | Status: DC
  Administered 2016-03-14 – 2016-03-16 (×3): 180 mg via ORAL
  Filled 2016-03-14 (×3): qty 1

## 2016-03-14 MED ORDER — POTASSIUM CHLORIDE 20 MEQ/15ML (10%) PO SOLN
60.0000 meq | Freq: Four times a day (QID) | ORAL | Status: AC
  Administered 2016-03-14 (×2): 60 meq
  Filled 2016-03-14 (×2): qty 45

## 2016-03-14 MED ORDER — FUROSEMIDE 40 MG PO TABS
40.0000 mg | ORAL_TABLET | Freq: Every day | ORAL | Status: DC
  Administered 2016-03-14 – 2016-03-16 (×3): 40 mg via ORAL
  Filled 2016-03-14 (×3): qty 1

## 2016-03-14 MED ORDER — MAGNESIUM SULFATE 2 GM/50ML IV SOLN
2.0000 g | Freq: Once | INTRAVENOUS | Status: AC
  Administered 2016-03-14: 2 g via INTRAVENOUS
  Filled 2016-03-14 (×2): qty 50

## 2016-03-14 MED ORDER — VITAL 1.5 CAL PO LIQD
1100.0000 mL | ORAL | Status: DC
  Administered 2016-03-14 – 2016-03-15 (×2): 1100 mL
  Filled 2016-03-14 (×4): qty 2000

## 2016-03-14 MED ORDER — WARFARIN SODIUM 3 MG PO TABS
3.0000 mg | ORAL_TABLET | Freq: Once | ORAL | Status: AC
Start: 2016-03-14 — End: 2016-03-14
  Administered 2016-03-14: 3 mg via ORAL
  Filled 2016-03-14 (×2): qty 1

## 2016-03-14 MED ORDER — METOPROLOL TARTRATE 50 MG PO TABS
50.0000 mg | ORAL_TABLET | Freq: Two times a day (BID) | ORAL | Status: DC
  Administered 2016-03-14 – 2016-03-16 (×4): 50 mg via ORAL
  Filled 2016-03-14 (×4): qty 1

## 2016-03-14 MED ORDER — WARFARIN SODIUM 4 MG PO TABS: 4.0000 mg | ORAL_TABLET | Freq: Once | ORAL | Status: DC

## 2016-03-14 NOTE — Progress Notes (Signed)
19 Days Post-Op  Subjective: She is still having gas and loose stools with Tube feed, but it seems to be tolerable at the current level. Discussion with the nutrition service and it seems the 55 ml/hr may be the best we can do now.  Jtube and abdomen looks fine.    Objective: Vital signs in last 24 hours: Temp:  [98 F (36.7 C)-98.4 F (36.9 C)] 98.2 F (36.8 C) (07/09 0420) Pulse Rate:  [65-94] 65 (07/09 0420) Resp:  [16-19] 19 (07/09 0420) BP: (107-136)/(42-77) 122/68 mmHg (07/09 0420) SpO2:  [97 %-98 %] 98 % (07/09 0420) Weight:  [66.996 kg (147 lb 11.2 oz)] 66.996 kg (147 lb 11.2 oz) (07/08 1544) Last BM Date: 03/12/16 600 Po 775 urine Stool x 1 Afebrile, VSS K+ 3.0 CBC is stable Intake/Output from previous day: 07/08 0701 - 07/09 0700 In: 2387.5 [P.O.:600; NG/GT:1787.5] Out: 776 [Urine:775; Stool:1] Intake/Output this shift:    General appearance: alert, cooperative and no distress GI: soft, sites look fine tolerating PO's. Having loose stools and some gas.   Lab Results:   Recent Labs  03/13/16 0525 03/14/16 0430  WBC 6.1 6.0  HGB 10.5* 9.9*  HCT 35.7* 33.5*  PLT 258 230    BMET  Recent Labs  03/13/16 0525 03/14/16 0430  NA 141 142  K 3.6 3.0*  CL 107 106  CO2 28 30  GLUCOSE 107* 106*  BUN 14 14  CREATININE 0.70 0.85  CALCIUM 8.6* 8.3*   PT/INR  Recent Labs  03/13/16 0525 03/14/16 0430  LABPROT 14.5 15.2  INR 1.11 1.19    No results for input(s): AST, ALT, ALKPHOS, BILITOT, PROT, ALBUMIN in the last 168 hours.   Lipase     Component Value Date/Time   LIPASE 48 02/17/2016 1509     Studies/Results: No results found.  Medications: . antiseptic oral rinse  7 mL Mouth Rinse BID  . diltiazem  60 mg Per Tube Q6H  . dronabinol  2.5 mg Oral QAC lunch  . enoxaparin (LOVENOX) injection  40 mg Subcutaneous Q24H  . feeding supplement (VITAL 1.5 CAL)  1,100 mL Per Tube Q24H  .  HYDROmorphone (DILAUDID) injection  0.5 mg Intravenous Once   . insulin aspart  0-9 Units Subcutaneous Q4H  . levothyroxine  50 mcg Oral QAC breakfast  . lidocaine  1 patch Transdermal Q24H  . metoprolol tartrate  50 mg Per Tube BID  . ondansetron  4 mg Intravenous Q6H  . pantoprazole sodium  40 mg Per Tube BID  . pregabalin  75 mg Oral BID  . simethicone  80 mg Oral QID  . sucralfate  1 g Oral TID WC & HS  . Warfarin - Pharmacist Dosing Inpatient   Does not apply q1800     Assessment/Plan Gastric cancer S/p open gastrectomy 02/05/16, esophagitis PO intolerance with Jejunostomy tube placement 02/24/16, [name] Multiple medical issues DNR FEN: Feeding tube/full liquids Goal is 55 ml per hour ID: None DVT: Lovneox   Plan:  Continue tube feedings and PO as ordered currently.      LOS: 24 days    Tyrina Hines 03/14/2016 [phone]

## 2016-03-14 NOTE — Progress Notes (Signed)
ANTICOAGULATION CONSULT NOTE - Follow Up Consult  Pharmacy Consult for warfarin Indication: atrial fibrillation   Labs:  Recent Labs  03/12/16 0425 03/13/16 0525 03/14/16 0430  HGB 10.1* 10.5* 9.9*  HCT 34.1* 35.7* 33.5*  PLT 249 258 230  LABPROT  --  14.5 15.2  INR  --  1.11 1.19  CREATININE 0.67 0.70 0.85    Assessment: 82 yo presenting with n/v. Pharmacy consulted to dose/monitor heparin gtt for Hx Afib CVR on diltiazem. Pt  post op 6/20 gastric surgery and  Jtube placed.  Heparin drip stopped and has been on enoxaparin for VTE px.  Warfarin resumed 7/7. CBC stable and INR 1.19 today. Warfarin 2mg  given 7/7 & 7/8. On TF and synthroid.   Goal of Therapy:  INR 2-3   Plan:  Give Warfarin 3mg  po x1 today - Higher dose due to lack of response in INR, however cautious with increase due to history of high INRs and recent hematoma. Daily PT/INR Monitor s/s bleeding  Sloan Leiter, PharmD, BCPS Clinical Pharmacist [phone]  03/14/2016 9:46 AM

## 2016-03-14 NOTE — Progress Notes (Signed)
[facility] Progress Note  Patient: [name] [id number]   PCP: [name], MD DOB: [birth year]   DOA: 02/17/2016   DOS: 03/14/2016   Date of Service: the patient was seen and examined on 03/14/2016  Subjective:  Denies any nausea, vomiting or bloating today. Tolerate her diet very well, not advance to soft diet hopefully she can be discharged in a.m. or at most Tuesday.  Brief hospital course: Pt. with PMH of A. fib, CKD,HTN, gastric cancer S/P proximal gastrectomy 01/2016; admitted on 02/17/2016, with complaint of vomiting, was found to have suspected esophagitis. Patient was given IV Diflucan, IV PPI and was started on TPN. Also started on IV erythromycin for motility. With initial treatment the patient was able to tolerate by mouth pills, but no food intake and therefore it was decided to place a J-tube, procedure done on 02/24/2016. Since the procedure pt has developed ileus which Appears to be improving. She also has anemia which appears to be dilutional and refusing blood transfusion. CT abdomen shows evidence of hematoma with any active bleeding. No other acute abnormality. Still with frequent nausea and vomiting Tolerating Tube feeds via J tube 7/4: [facility] reconsulted  Assessment and Plan:  1. Gastric adenocarcinoma/Recent subtotal gastrectomy for gastric adenocarcinoma May 2017 -with Intractable nausea and vomiting since mid May -last EGD 6/1 Dr.Stark with Grade D esophagitis and small non bleeding gastric ulcers at anastomoses -Tolerating TFs -gradually being advanced, goal of 45cc/hr, now at 40cc/hr -Weaned off TNA  -recurrent admissions with post op N/V -S/P J-tube placement 02/24/2016. -Has Been on IV PPI, Pepcid, also completed 2 weeks of IV FLuconazole for Oral thrush. -Discussed with [name], okay to discontinue the erythromycin and minimize medications. -GI reconsulted, recommended discontinue Phenergan, schedule the Zofran and Carafate, keep  head elevated. -Per [name] cyclical to feeding, family reported multiple bowel movement when her to feel increased area -Diet advance to soft diet.  2. Paroxysmal A. Fib/Pacemaker implant -Chadsvasc 5. On warfarin at home -Currently rate controlled. Continue Cardizem when necessary,  -On liquid metoprolol via J tube -Per [name], okay to resume warfarin, Coumadin restarted, INR is 1.1  3. Hypothyroidism. -on IV synthroid, will switch to oral Synthroid see if she tolerates them.  4. Postoperative blood loss anemia. -Surgical site hematoma, stable -No evidence of active bleeding and component of hemodilution -Patient refused blood transfusion,  -S/P IV Feraheme 5101, [place] [address] Aranesp 1 as well as injection B-12 on 02/27/2016, after discussion with patient's hematology Dr Irene Limbo. -CT abdomen showed evidence of small surgical site hematoma what appears to be stable at present. -off anticoagulation, ok to restart Warfarin d/w CCS, but unable to take PO   5. Dehydration. -Mild renal insufficiency earlier. -on TNA, resolved  6. Adult failure to thrive -Currently on tube feeding.  7. Mild Volume overload -Given IV Lasix, restart her oral Lasix.  8. Hypokalemia -Replete with enteral supplements.  DVT Prophylaxis: on prophylactic Lovenox.  Advance goals of care discussion: DNR/DNI Family Communication: Daughters (Diana POA and Burien) at bedside Disposition: Discharge to home with home health when improved  Consultants: Gen. surgery Procedures: J-tube placement, PICC line placement, TPN  Antibiotics: Anti-infectives    Start     Dose/Rate Route Frequency Ordered Stop   03/08/16 2200  erythromycin 250 mg in sodium chloride 0.9 % 100 mL IVPB  Status:  Discontinued     250 mg 100 mL/hr over 60 Minutes Intravenous Every 12 hours 03/08/16 1058 03/10/16 1209   03/05/16 1600  erythromycin 250 mg in sodium chloride 0.9 % 100 mL IVPB  Status:  Discontinued     250 mg 100 mL/hr over  60 Minutes Intravenous Every 6 hours 03/05/16 1120 03/08/16 1058   03/04/16 2200  erythromycin 250 mg in sodium chloride 0.9 % 100 mL IVPB  Status:  Discontinued     250 mg 100 mL/hr over 60 Minutes Intravenous Every 12 hours 03/04/16 0803 03/04/16 1306   03/04/16 1400  erythromycin 250 mg in sodium chloride 0.9 % 100 mL IVPB  Status:  Discontinued     250 mg 100 mL/hr over 60 Minutes Intravenous Every 8 hours 03/04/16 1306 03/05/16 1120   03/02/16 1530  erythromycin 250 mg in sodium chloride 0.9 % 100 mL IVPB  Status:  Discontinued     250 mg 100 mL/hr over 60 Minutes Intravenous Every 6 hours 03/02/16 1424 03/04/16 0803   02/29/16 1000  fluconazole (DIFLUCAN) IVPB 200 mg     200 mg 100 mL/hr over 60 Minutes Intravenous Every 24 hours 02/29/16 0847 03/02/16 1111   02/24/16 1000  ceFAZolin (ANCEF) IVPB 2g/100 mL premix    Comments:  On call to OR.   2 g 200 mL/hr over 30 Minutes Intravenous To [facility] 02/24/16 0848 02/24/16 1215   02/18/16 1000  fluconazole (DIFLUCAN) IVPB 200 mg  Status:  Discontinued     200 mg 100 mL/hr over 60 Minutes Intravenous Every 24 hours 02/18/16 0917 02/29/16 0847   02/17/16 2200  erythromycin 250 mg in sodium chloride 0.9 % 100 mL IVPB  Status:  Discontinued     250 mg 100 mL/hr over 60 Minutes Intravenous Every 12 hours 02/17/16 1720 02/17/16 1853   02/17/16 2100  erythromycin 250 mg in sodium chloride 0.9 % 100 mL IVPB  Status:  Discontinued     250 mg 100 mL/hr over 60 Minutes Intravenous Every 12 hours 02/17/16 1853 02/20/16 0737        Intake/Output Summary (Last 24 hours) at 03/14/16 1401 Last data filed at 03/14/16 0700  Gross per 24 hour  Intake 2147.5 ml  Output    600 ml  Net 1547.5 ml   Filed Weights   03/12/16 0626 03/12/16 1256 03/13/16 1544  Weight: 61.871 kg (136 lb 6.4 oz) 66.996 kg (147 lb 11.2 oz) 66.996 kg (147 lb 11.2 oz)    Objective: Physical Exam: Filed Vitals:   03/13/16 1544 03/13/16 2032 03/14/16 0420  03/14/16 1356  BP:  129/77 122/68 121/53  Pulse:  68 65 77  Temp:  98.3 F (36.8 C) 98.2 F (36.8 C) 98.4 F (36.9 C)  TempSrc:  Oral Axillary Oral  Resp:  19 19   Height:      Weight: 66.996 kg (147 lb 11.2 oz)     SpO2:  98% 98% 96%    General: Alert, Awake, frail elderly woman, chronically ill appearing Eyes: PERRL, Conjunctiva normal ENT: Oral Mucosa clear moist. Cardiovascular: S1 and S2 Present, systolic murmur Respiratory:  Fine basilar crackles Abdomen: Bowel Sound present, Soft and mild tenderness, surgical scar noted, J tube noted Skin: no redness, no Rash  Extremities: no Pedal edema, no calf tenderness Neurologic: Grossly no focal neuro deficit. Bilaterally Equal motor strength  Data Reviewed: CBC:  Recent Labs Lab 03/10/16 0500 03/11/16 0415 03/12/16 0425 03/13/16 0525 03/14/16 0430  WBC 5.2 5.8 6.4 6.1 6.0  HGB 9.6* 9.9* 10.1* 10.5* 9.9*  HCT 32.0* 32.4* 34.1* 35.7* 33.5*  MCV 103.6* 104.2* 104.9* 104.4* 105.3*  PLT 268 270 249 258 [id number]   Basic Metabolic Panel:  Recent Labs Lab 03/10/16 0500 03/11/16 0415 03/12/16 0425 03/13/16 0525 03/14/16 0430  NA 144 145 142 141 142  K 3.3* 3.6 3.6 3.6 3.0*  CL 111 111 109 107 106  CO2 26 27 27 28 30   GLUCOSE 114* 114* 115* 107* 106*  BUN 9 13 14 14 14   CREATININE 0.66 0.68 0.67 0.70 0.85  CALCIUM 8.4* 8.5* 8.3* 8.6* 8.3*  MG  --   --  1.7  --   --     Liver Function Tests: No results for input(s): AST, ALT, ALKPHOS, BILITOT, PROT, ALBUMIN in the last 168 hours. No results for input(s): LIPASE, AMYLASE in the last 168 hours. No results for input(s): AMMONIA in the last 168 hours. Coagulation Profile:  Recent Labs Lab 03/08/16 0514 03/13/16 0525 03/14/16 0430  INR 1.22 1.11 1.19   Cardiac Enzymes: No results for input(s): CKTOTAL, CKMB, CKMBINDEX, TROPONINI in the last 168 hours. BNP (last 3 results) No results for input(s): PROBNP in the last 8760 hours.  CBG:  Recent Labs Lab  03/13/16 2031 03/14/16 0025 03/14/16 0419 03/14/16 0729 03/14/16 1152  GLUCAP 97 106* 108* 117* 112*    Studies: No results found.   Scheduled Meds: . diltiazem  60 mg Per Tube Q6H  . dronabinol  2.5 mg Oral QAC lunch  . enoxaparin (LOVENOX) injection  40 mg Subcutaneous Q24H  . feeding supplement (VITAL 1.5 CAL)  1,100 mL Per Tube Q24H  .  HYDROmorphone (DILAUDID) injection  0.5 mg Intravenous Once  . insulin aspart  0-9 Units Subcutaneous Q4H  . levothyroxine  50 mcg Oral QAC breakfast  . lidocaine  1 patch Transdermal Q24H  . metoprolol tartrate  50 mg Per Tube BID  . ondansetron  4 mg Intravenous Q6H  . pantoprazole sodium  40 mg Per Tube BID  . pregabalin  75 mg Oral BID  . simethicone  80 mg Oral QID  . sucralfate  1 g Oral TID WC & HS  . warfarin  3 mg Oral ONCE-1800  . Warfarin - Pharmacist Dosing Inpatient   Does not apply q1800   Continuous Infusions:   PRN Meds: acetaminophen **OR** acetaminophen, alum & mag hydroxide-simeth, LORazepam, sodium chloride flush  Time spent: 30 minutes  Author: Birdie Hopes Pager: [phone] 03/14/2016, 2:01 PM If 7PM-7AM, please contact night-coverage at [email or web address], password [facility]

## 2016-03-15 ENCOUNTER — Ambulatory Visit: Payer: Medicare Other | Admitting: Cardiology

## 2016-03-15 LAB — GLUCOSE, CAPILLARY
GLUCOSE-CAPILLARY: 112 mg/dL — AB (ref 65–99)
GLUCOSE-CAPILLARY: 146 mg/dL — AB (ref 65–99)
Glucose-Capillary: 101 mg/dL — ABNORMAL HIGH (ref 65–99)
Glucose-Capillary: 108 mg/dL — ABNORMAL HIGH (ref 65–99)
Glucose-Capillary: 110 mg/dL — ABNORMAL HIGH (ref 65–99)
Glucose-Capillary: 112 mg/dL — ABNORMAL HIGH (ref 65–99)

## 2016-03-15 LAB — MAGNESIUM: Magnesium: 2.1 mg/dL (ref 1.7–2.4)

## 2016-03-15 LAB — PROTIME-INR
INR: 1.12 (ref 0.00–1.49)
Prothrombin Time: 14.6 seconds (ref 11.6–15.2)

## 2016-03-15 LAB — BASIC METABOLIC PANEL
Anion gap: 6 (ref 5–15)
BUN: 15 mg/dL (ref 6–20)
CALCIUM: 8.3 mg/dL — AB (ref 8.9–10.3)
CHLORIDE: 107 mmol/L (ref 101–111)
CO2: 29 mmol/L (ref 22–32)
CREATININE: 0.78 mg/dL (ref 0.44–1.00)
GFR calc Af Amer: >60 mL/min (ref >60)
GFR calc non Af Amer: >60 mL/min (ref >60)
GLUCOSE: 104 mg/dL — AB (ref 65–99)
Potassium: 3.7 mmol/L (ref 3.5–5.1)
Sodium: 142 mmol/L (ref 135–145)

## 2016-03-15 LAB — CBC
HEMATOCRIT: 33.6 % — AB (ref 36.0–46.0)
Hemoglobin: 10.2 g/dL — ABNORMAL LOW (ref 12.0–15.0)
MCH: 31.4 pg (ref 26.0–34.0)
MCHC: 30.4 g/dL (ref 30.0–36.0)
MCV: 103.4 fL — AB (ref 78.0–100.0)
Platelets: 225 10*3/uL (ref 150–400)
RBC: 3.25 MIL/uL — ABNORMAL LOW (ref 3.87–5.11)
RDW: 18.7 % — AB (ref 11.5–15.5)
WBC: 5.8 10*3/uL (ref 4.0–10.5)

## 2016-03-15 MED ORDER — WARFARIN SODIUM 3 MG PO TABS
3.0000 mg | ORAL_TABLET | Freq: Once | ORAL | Status: AC
  Administered 2016-03-15: 3 mg via ORAL
  Filled 2016-03-15: qty 1

## 2016-03-15 NOTE — Progress Notes (Signed)
Patient ID: [name], female   DOB: 1933/11/24, 82 y.o.   MRN: [id number] 20 Days Post-Op  Subjective: Doing much better.  Tolerating some soft foods.  Minimal retching.    Objective: Vital signs in last 24 hours: Temp:  [97.8 F (36.6 C)-98.8 F (37.1 C)] 97.8 F (36.6 C) (07/10 0515) Pulse Rate:  [76-90] 76 (07/10 0515) Resp:  [17-19] 17 (07/10 0515) BP: (121-138)/(53-59) 138/57 mmHg (07/10 0515) SpO2:  [96 %-99 %] 97 % (07/10 0515) Weight:  [66.452 kg (146 lb 8 oz)] 66.452 kg (146 lb 8 oz) (07/10 0515) Last BM Date: 03/15/16 600 Po 775 urine Stool x 1 Afebrile, VSS K+ 3.0 CBC is stable Intake/Output from previous day: 07/09 0701 - 07/10 0700 In: -  Out: 400 [Urine:400] Intake/Output this shift: Total I/O In: 360 [P.O.:360] Out: -   General appearance: alert, cooperative and no distress GI: soft, non distended.  J tube in place.   Lab Results:   Recent Labs  03/14/16 0430 03/15/16 0448  WBC 6.0 5.8  HGB 9.9* 10.2*  HCT 33.5* 33.6*  PLT 230 225    BMET  Recent Labs  03/14/16 0430 03/15/16 0448  NA 142 142  K 3.0* 3.7  CL 106 107  CO2 30 29  GLUCOSE 106* 104*  BUN 14 15  CREATININE 0.85 0.78  CALCIUM 8.3* 8.3*   PT/INR  Recent Labs  03/14/16 0430 03/15/16 0448  LABPROT 15.2 14.6  INR 1.19 1.12    No results for input(s): AST, ALT, ALKPHOS, BILITOT, PROT, ALBUMIN in the last 168 hours.   Lipase     Component Value Date/Time   LIPASE 48 02/17/2016 1509     Studies/Results: No results found.  Medications: . diltiazem  180 mg Oral Daily  . dronabinol  2.5 mg Oral QAC lunch  . enoxaparin (LOVENOX) injection  40 mg Subcutaneous Q24H  . feeding supplement (VITAL 1.5 CAL)  1,100 mL Per Tube Q24H  . furosemide  40 mg Oral Daily  .  HYDROmorphone (DILAUDID) injection  0.5 mg Intravenous Once  . insulin aspart  0-9 Units Subcutaneous Q4H  . levothyroxine  50 mcg Oral QAC breakfast  . lidocaine  1 patch Transdermal Q24H  .  metoprolol tartrate  50 mg Oral BID  . ondansetron  4 mg Intravenous Q6H  . pantoprazole sodium  40 mg Per Tube BID  . pregabalin  75 mg Oral BID  . simethicone  80 mg Oral QID  . sucralfate  1 g Oral TID WC & HS  . Warfarin - Pharmacist Dosing Inpatient   Does not apply q1800     Assessment/Plan Gastric cancer S/p open gastrectomy 02/05/16, esophagitis PO intolerance with Jejunostomy tube placement 02/24/16, [name] Multiple medical issues DNR FEN: Feeding tube/full liquids Goal is 55 ml per hour for 20 hours. OK to give liquid meds in tube, but not solid meds in liquids.   ID: None DVT: Lovneox   Plan: Probably home tomorrow.      LOS: 25 days    [name] 03/15/2016 [phone]

## 2016-03-15 NOTE — Care Management Important Message (Signed)
Important Message  Patient Details  Name: [name] MRN: [id number] Date of Birth: Mar 22, 1934   Medicare Important Message Given:  Yes    Loann Quill 03/15/2016, 9:53 AM

## 2016-03-15 NOTE — Progress Notes (Signed)
[facility] Progress Note  Patient: [name] [id number]   PCP: [name], MD DOB: Jun 04, 1934   DOA: 02/17/2016   DOS: 03/15/2016   Date of Service: the patient was seen and examined on 03/15/2016   Subjective:  Seen with her daughter at bedside, tolerating the soft diet very well. Likely home in a.m.  Brief hospital course: Pt. with PMH of A. fib, CKD,HTN, gastric cancer S/P proximal gastrectomy 01/2016; admitted on 02/17/2016, with complaint of vomiting, was found to have suspected esophagitis. Patient was given IV Diflucan, IV PPI and was started on TPN. Also started on IV erythromycin for motility. With initial treatment the patient was able to tolerate by mouth pills, but no food intake and therefore it was decided to place a J-tube, procedure done on 02/24/2016. Since the procedure pt has developed ileus which Appears to be improving. She also has anemia which appears to be dilutional and refusing blood transfusion. CT abdomen shows evidence of hematoma with any active bleeding. No other acute abnormality. Still with frequent nausea and vomiting Tolerating Tube feeds via J tube 7/4: [facility] reconsulted  Assessment and Plan:  1. Gastric adenocarcinoma/Recent subtotal gastrectomy for gastric adenocarcinoma May 2017 -with Intractable nausea and vomiting since mid May -last EGD 6/1 Dr.Stark with Grade D esophagitis and small non bleeding gastric ulcers at anastomoses -Tolerating TFs -gradually being advanced, goal of 45cc/hr, now at 40cc/hr -Weaned off TNA  -recurrent admissions with post op N/V -S/P J-tube placement 02/24/2016. -Has Been on IV PPI, Pepcid, also completed 2 weeks of IV FLuconazole for Oral thrush. -Discussed with [name], okay to discontinue the erythromycin and minimize medications. -GI reconsulted, recommended discontinue Phenergan, schedule the Zofran and Carafate, keep head elevated. -Per [name] cyclical to feeding, family reported  multiple bowel movement when her to feel increased area -Diet advance to soft diet. Likely home in a.m. if continues to tolerate diet.  2. Paroxysmal A. Fib/Pacemaker implant -Chadsvasc 5. On warfarin at home -Currently rate controlled. Continue Cardizem when necessary,  -On liquid metoprolol via J tube -Per [name], okay to resume warfarin, Coumadin restarted, INR is 1.1  3. Hypothyroidism. -on IV synthroid, will switch to oral Synthroid see if she tolerates them.  4. Postoperative blood loss anemia. -Surgical site hematoma, stable -No evidence of active bleeding and component of hemodilution -Patient refused blood transfusion,  -S/P IV Feraheme 5101, Aurelia Aranesp 1 as well as injection B-12 on 02/27/2016, after discussion with patient's hematology Dr Irene Limbo. -CT abdomen showed evidence of small surgical site hematoma what appears to be stable at present. -off anticoagulation, ok to restart Warfarin d/w CCS, but unable to take PO   5. Dehydration. -Mild renal insufficiency earlier. -on TNA, resolved  6. Adult failure to thrive -Currently on tube feeding.  7. Mild Volume overload -Given IV Lasix, restart her oral Lasix.  8. Hypokalemia -Replete with enteral supplements.  DVT Prophylaxis: on prophylactic Lovenox.  Advance goals of care discussion: DNR/DNI Family Communication: Daughters (Diana POA and Jay) at bedside Disposition: Discharge to home with home health when improved  Consultants: Gen. surgery Procedures: J-tube placement, PICC line placement, TPN  Antibiotics: Anti-infectives    Start     Dose/Rate Route Frequency Ordered Stop   03/08/16 2200  erythromycin 250 mg in sodium chloride 0.9 % 100 mL IVPB  Status:  Discontinued     250 mg 100 mL/hr over 60 Minutes Intravenous Every 12 hours 03/08/16 1058 03/10/16 1209   03/05/16 1600  erythromycin  250 mg in sodium chloride 0.9 % 100 mL IVPB  Status:  Discontinued     250 mg 100 mL/hr over 60 Minutes  Intravenous Every 6 hours 03/05/16 1120 03/08/16 1058   03/04/16 2200  erythromycin 250 mg in sodium chloride 0.9 % 100 mL IVPB  Status:  Discontinued     250 mg 100 mL/hr over 60 Minutes Intravenous Every 12 hours 03/04/16 0803 03/04/16 1306   03/04/16 1400  erythromycin 250 mg in sodium chloride 0.9 % 100 mL IVPB  Status:  Discontinued     250 mg 100 mL/hr over 60 Minutes Intravenous Every 8 hours 03/04/16 1306 03/05/16 1120   03/02/16 1530  erythromycin 250 mg in sodium chloride 0.9 % 100 mL IVPB  Status:  Discontinued     250 mg 100 mL/hr over 60 Minutes Intravenous Every 6 hours 03/02/16 1424 03/04/16 0803   02/29/16 1000  fluconazole (DIFLUCAN) IVPB 200 mg     200 mg 100 mL/hr over 60 Minutes Intravenous Every 24 hours 02/29/16 0847 03/02/16 1111   02/24/16 1000  ceFAZolin (ANCEF) IVPB 2g/100 mL premix    Comments:  On call to OR.   2 g 200 mL/hr over 30 Minutes Intravenous To [facility] 02/24/16 0848 02/24/16 1215   02/18/16 1000  fluconazole (DIFLUCAN) IVPB 200 mg  Status:  Discontinued     200 mg 100 mL/hr over 60 Minutes Intravenous Every 24 hours 02/18/16 0917 02/29/16 0847   02/17/16 2200  erythromycin 250 mg in sodium chloride 0.9 % 100 mL IVPB  Status:  Discontinued     250 mg 100 mL/hr over 60 Minutes Intravenous Every 12 hours 02/17/16 1720 02/17/16 1853   02/17/16 2100  erythromycin 250 mg in sodium chloride 0.9 % 100 mL IVPB  Status:  Discontinued     250 mg 100 mL/hr over 60 Minutes Intravenous Every 12 hours 02/17/16 1853 02/20/16 0737        Intake/Output Summary (Last 24 hours) at 03/15/16 1356 Last data filed at 03/15/16 0923  Gross per 24 hour  Intake    360 ml  Output    400 ml  Net    -40 ml   Filed Weights   03/12/16 1256 03/13/16 1544 03/15/16 0515  Weight: 66.996 kg (147 lb 11.2 oz) 66.996 kg (147 lb 11.2 oz) 66.452 kg (146 lb 8 oz)    Objective: Physical Exam: Filed Vitals:   03/14/16 0420 03/14/16 1356 03/14/16 2210 03/15/16 0515    BP: 122/68 121/53 121/59 138/57  Pulse: 65 77 90 76  Temp: 98.2 F (36.8 C) 98.4 F (36.9 C) 98.8 F (37.1 C) 97.8 F (36.6 C)  TempSrc: Axillary Oral Oral   Resp: 19  19 17   Height:      Weight:    66.452 kg (146 lb 8 oz)  SpO2: 98% 96% 99% 97%    General: Alert, Awake, frail elderly woman, chronically ill appearing Eyes: PERRL, Conjunctiva normal ENT: Oral Mucosa clear moist. Cardiovascular: S1 and S2 Present, systolic murmur Respiratory:  Fine basilar crackles Abdomen: Bowel Sound present, Soft and mild tenderness, surgical scar noted, J tube noted Skin: no redness, no Rash  Extremities: no Pedal edema, no calf tenderness Neurologic: Grossly no focal neuro deficit. Bilaterally Equal motor strength  Data Reviewed: CBC:  Recent Labs Lab 03/11/16 0415 03/12/16 0425 03/13/16 0525 03/14/16 0430 03/15/16 0448  WBC 5.8 6.4 6.1 6.0 5.8  HGB 9.9* 10.1* 10.5* 9.9* 10.2*  HCT 32.4* 34.1* 35.7* 33.5*  33.6*  MCV 104.2* 104.9* 104.4* 105.3* 103.4*  PLT 270 249 258 230 [id number]   Basic Metabolic Panel:  Recent Labs Lab 03/11/16 0415 03/12/16 0425 03/13/16 0525 03/14/16 0430 03/15/16 0448  NA 145 142 141 142 142  K 3.6 3.6 3.6 3.0* 3.7  CL 111 109 107 106 107  CO2 27 27 28 30 29   GLUCOSE 114* 115* 107* 106* 104*  BUN 13 14 14 14 15   CREATININE 0.68 0.67 0.70 0.85 0.78  CALCIUM 8.5* 8.3* 8.6* 8.3* 8.3*  MG  --  1.7  --   --  2.1    Liver Function Tests: No results for input(s): AST, ALT, ALKPHOS, BILITOT, PROT, ALBUMIN in the last 168 hours. No results for input(s): LIPASE, AMYLASE in the last 168 hours. No results for input(s): AMMONIA in the last 168 hours. Coagulation Profile:  Recent Labs Lab 03/13/16 0525 03/14/16 0430 03/15/16 0448  INR 1.11 1.19 1.12   Cardiac Enzymes: No results for input(s): CKTOTAL, CKMB, CKMBINDEX, TROPONINI in the last 168 hours. BNP (last 3 results) No results for input(s): PROBNP in the last 8760 hours.  CBG:  Recent  Labs Lab 03/14/16 2024 03/15/16 0015 03/15/16 0521 03/15/16 0805 03/15/16 1146  GLUCAP 115* 112* 108* 110* 112*    Studies: No results found.   Scheduled Meds: . diltiazem  180 mg Oral Daily  . dronabinol  2.5 mg Oral QAC lunch  . enoxaparin (LOVENOX) injection  40 mg Subcutaneous Q24H  . feeding supplement (VITAL 1.5 CAL)  1,100 mL Per Tube Q24H  . furosemide  40 mg Oral Daily  .  HYDROmorphone (DILAUDID) injection  0.5 mg Intravenous Once  . insulin aspart  0-9 Units Subcutaneous Q4H  . levothyroxine  50 mcg Oral QAC breakfast  . lidocaine  1 patch Transdermal Q24H  . metoprolol tartrate  50 mg Oral BID  . ondansetron  4 mg Intravenous Q6H  . pantoprazole sodium  40 mg Per Tube BID  . pregabalin  75 mg Oral BID  . simethicone  80 mg Oral QID  . sucralfate  1 g Oral TID WC & HS  . warfarin  3 mg Oral ONCE-1800  . Warfarin - Pharmacist Dosing Inpatient   Does not apply q1800   Continuous Infusions:   PRN Meds: acetaminophen **OR** acetaminophen, alum & mag hydroxide-simeth, LORazepam, sodium chloride flush  Time spent: 30 minutes  Author: Birdie Hopes Pager: [phone] 03/15/2016, 1:56 PM If 7PM-7AM, please contact night-coverage at [email or web address], password [facility]

## 2016-03-15 NOTE — Progress Notes (Signed)
Physical Therapy Treatment Patient Details Name: [name] MRN: [id number] DOB: [birth year] Today's Date: 03/15/2016    History of Present Illness [name] is a 82 y.o. female with medical history significant for but not limited to, atrial fibrillation, CKD, HTN, kidney cancer and gastric cancer. She is status post proximal gastrectomy early May for gastric adenocarcinoma. d/c home and readmitted with gastritis and received TNA. Went home and readmit 6/9 with N/V. 6/20 jejunostomy    PT Comments    Pt and pt's daughter both report that pt is ambulating with daughter's assistance frequently throughout the day. Pt also stated that she performs exercises she has learned with PT daily as well. Pt continues to progress with distance ambulated during each session. Pt would continue to benefit from skilled physical therapy services at this time while inpatient and after d/c.    Follow Up Recommendations  Home health PT;Supervision/Assistance - 24 hour     Equipment Recommendations  None recommended by PT    Recommendations for Other Services       Precautions / Restrictions Precautions Precautions: Fall Precaution Comments: J-tube Restrictions Weight Bearing Restrictions: No    Mobility  Bed Mobility               General bed mobility comments: Pt was returning to her bedside chair from the restroom with her daughter when PT entered room.  Transfers Overall transfer level: Needs assistance Equipment used: Rolling walker (2 wheeled) Transfers: Sit to/from Stand Sit to Stand: Min guard         General transfer comment: Pt required assistance for line management and safety.  Ambulation/Gait Ambulation/Gait assistance: Min guard Ambulation Distance (Feet): 150 Feet (x2 with standing rest break) Assistive device: Rolling walker (2 wheeled) Gait Pattern/deviations: Decreased stride length;Step-through pattern;Trunk flexed Gait velocity: decreased Gait  velocity interpretation: Below normal speed for age/gender General Gait Details: pt was able to express when she felt fatigued and had a standing rest break for approximately 15 seconds and was able to ambulate again.   Stairs            Wheelchair Mobility    Modified Rankin (Stroke Patients Only)       Balance Overall balance assessment: Needs assistance Sitting-balance support: No upper extremity supported Sitting balance-Leahy Scale: Fair     Standing balance support: Single extremity supported Standing balance-Leahy Scale: Poor                      Cognition Arousal/Alertness: Awake/alert Behavior During Therapy: WFL for tasks assessed/performed Overall Cognitive Status: Within Functional Limits for tasks assessed                      Exercises General Exercises - Lower Extremity Ankle Circles/Pumps: AROM;Both;10 reps Long Arc Quad: AROM;Both;10 reps Hip Flexion/Marching: AROM;Both;5 reps;Seated    General Comments        Pertinent Vitals/Pain Pain Assessment: No/denies pain    Home Living                      Prior Function            PT Goals (current goals can now be found in the care plan section) Acute Rehab PT Goals Patient Stated Goal: go home PT Goal Formulation: With patient/family Time For Goal Achievement: 03/29/16 Potential to Achieve Goals: Good Progress towards PT goals: Progressing toward goals    Frequency  Min 3X/week  PT Plan Current plan remains appropriate    Co-evaluation             End of Session Equipment Utilized During Treatment: Gait belt Activity Tolerance: Patient limited by fatigue Patient left: in chair;with call bell/phone within reach;with family/visitor present     Time: [id number] PT Time Calculation (min) (ACUTE ONLY): 17 min  Charges:  $Gait Training: 8-22 mins                    G CodesClearnce Sorrel Eshaal Duby 2016/03/31, 10:39 AM Sherie Don, PT,  DPT [phone]

## 2016-03-15 NOTE — Progress Notes (Signed)
Nutrition Follow-up  DOCUMENTATION CODES:   Non-severe (moderate) malnutrition in context of chronic illness  INTERVENTION:   -Continue Vital 1.5 @ 55 ml/hr via J-tube over 20 hour period.   Tube feeding regimen provides 1650 kcal (100% of needs), 74 grams of protein, and 840 ml of H2O.   NUTRITION DIAGNOSIS:   Malnutrition related to chronic illness, altered GI function as evidenced by mild depletion of body fat, mild depletion of muscle mass, percent weight loss.  Ongoing  GOAL:   Patient will meet greater than or equal to 90% of their needs  Met with TF  MONITOR:   Diet advancement, PO intake, Skin, I & O's, Labs, Weight trends  REASON FOR ASSESSMENT:   Consult Enteral/tube feeding initiation and management  ASSESSMENT:   82 y.o. female with a Past Medical History of PAF CAD, tachy/brady syndrome, RLS, GERD, uterine cancer and renal cancer, HTN, HLD, hypothyroidism, gastroparesis, Adenocarcinoma of the stomach s/p partial gastrectomy, ckd, and cva, who presents with nausea vomiting abdominal pain. This seems to be a recurrent issue for patient ever since she underwent partial gastrectomy.   S/p Procedure(s) 02/25/16: OPEN J TUBE (N/A)  Spoke with pt daughter. She reports pt was just advanced to a soft diet and tolerating well. She reports that she is encouraging pt to eat slowly, as she experienced some abdominal pain after eating her meal too quickly.   Pt daughter confirms that pt continues to tolerate TF regimen well. Pt receiving Vital 1.5 @ 55 ml/hr over a 20 hour period. Pt daughter confirms and recalls conversation from 03/13/16, which pt revealed that she could not tolerate above 60 ml/hr. Pt and family aware that Vital 1.5 is the most calorically dense, semi-elemental formula available on formulary.  They remain agreeable to lower TF rate with longer infusion time to promote tolerance.   Medications reviewed: noted marinol ordered on 03/12/16.   Per MD notes,  potential for discharge on 03/16/16 or 03/17/16.   Labs reviewed: CBGS: 112-129.   Diet Order:  DIET SOFT Room service appropriate?: Yes; Fluid consistency:: Thin  Skin:  Reviewed, no issues  Last BM:  03/15/16  Height:   Ht Readings from Last 1 Encounters:  03/12/16 _0  (1.549 m)    Weight:   Wt Readings from Last 1 Encounters:  03/15/16 146 lb 8 oz (66.452 kg)    Ideal Body Weight:  47.7 kg  BMI:  Body mass index is 27.7 kg/(m^2).  Estimated Nutritional Needs:   Kcal:  1600-1800  Protein:  75-90 grams  Fluid:  1.6-1.8 L  EDUCATION NEEDS:   No education needs identified at this time  Vyla Pint A. Jimmye Norman, RD, LDN, CDE Pager: [phone] After hours Pager: [phone]

## 2016-03-15 NOTE — Progress Notes (Signed)
ANTICOAGULATION CONSULT NOTE - Follow Up Consult  Pharmacy Consult:  Coumadin Indication: atrial fibrillation  Allergies  Allergen Reactions  . Amiodarone Other (See Comments)     Thyroid and liver and kidney problems  . Codeine Nausea And Vomiting  . Gabapentin Itching  . Hydrocodone Other (See Comments)    hallucination  . Morphine And Related Nausea And Vomiting  . Requip [Ropinirole Hcl]     Headache   . Zinc Nausea Only    nausea  . Reglan [Metoclopramide] Hives, Itching and Rash    Made face red, also    Patient Measurements: Height: 5\' 1"  (154.9 cm) Weight: 146 lb 8 oz (66.452 kg) IBW/kg (Calculated) : 47.8  Vital Signs: Temp: 97.8 F (36.6 C) (07/10 0515) BP: 138/57 mmHg (07/10 0515) Pulse Rate: 76 (07/10 0515)  Labs:  Recent Labs  03/13/16 0525 03/14/16 0430 03/15/16 0448  HGB 10.5* 9.9* 10.2*  HCT 35.7* 33.5* 33.6*  PLT 258 230 225  LABPROT 14.5 15.2 14.6  INR 1.11 1.19 1.12  CREATININE 0.70 0.85 0.78    Estimated Creatinine Clearance: 49 mL/min (by C-G formula based on Cr of 0.78).     Assessment: 7 YOM continues on Coumadin and Lovenox for history of AFib.  INR currently sub-therapeutic; no bleeding reported.   Goal of Therapy:  INR 2-3    Plan:  - Coumadin 3mg  PO today - Continue Lovenox 40mg  SQ Q24H until INR therapeutic - Daily PT / INR   Lydia Meng D. Mina Marble, PharmD, BCPS Pager:  [phone] 03/15/2016, 1:02 PM

## 2016-03-15 NOTE — Care Management Note (Signed)
Case Management Note  Patient Details  Name: [name] MRN: [id number] Date of Birth: 30-May-1934  Subjective/Objective:                    Action/Plan:  Spoke with patient and daughter at bedside . Patient wants to remain with [facility] Expected Discharge Date:                  Expected Discharge Plan:  [place]  In-House Referral:     Discharge planning Services  CM Consult  Post Acute Care Choice:  Durable Medical Equipment, Home Health Choice offered to:  Patient, Adult Children  DME Arranged:  Tube feeding, Tube feeding pump DME Agency:  [place]:  RN, PT, OT, Nurse's Aide [place] Agency:  [place]  Status of Service:  In process, will continue to follow  If discussed at Long Length of Stay Meetings, dates discussed:    Additional Comments:  Marilu Favre, RN 03/15/2016, 9:59 AM

## 2016-03-16 ENCOUNTER — Encounter: Payer: Self-pay | Admitting: *Deleted

## 2016-03-16 DIAGNOSIS — Z950 Presence of cardiac pacemaker: Secondary | ICD-10-CM

## 2016-03-16 LAB — CBC
HCT: 36.7 % (ref 36.0–46.0)
Hemoglobin: 10.8 g/dL — ABNORMAL LOW (ref 12.0–15.0)
MCH: 30.4 pg (ref 26.0–34.0)
MCHC: 29.4 g/dL — ABNORMAL LOW (ref 30.0–36.0)
MCV: 103.4 fL — AB (ref 78.0–100.0)
PLATELETS: 230 10*3/uL (ref 150–400)
RBC: 3.55 MIL/uL — AB (ref 3.87–5.11)
RDW: 18.1 % — AB (ref 11.5–15.5)
WBC: 5.8 10*3/uL (ref 4.0–10.5)

## 2016-03-16 LAB — GLUCOSE, CAPILLARY
GLUCOSE-CAPILLARY: 101 mg/dL — AB (ref 65–99)
GLUCOSE-CAPILLARY: 116 mg/dL — AB (ref 65–99)
GLUCOSE-CAPILLARY: 127 mg/dL — AB (ref 65–99)
GLUCOSE-CAPILLARY: 134 mg/dL — AB (ref 65–99)

## 2016-03-16 LAB — BASIC METABOLIC PANEL
ANION GAP: 10 (ref 5–15)
BUN: 17 mg/dL (ref 6–20)
CALCIUM: 8.7 mg/dL — AB (ref 8.9–10.3)
CO2: 31 mmol/L (ref 22–32)
Chloride: 102 mmol/L (ref 101–111)
Creatinine, Ser: 0.86 mg/dL (ref 0.44–1.00)
Glucose, Bld: 116 mg/dL — ABNORMAL HIGH (ref 65–99)
POTASSIUM: 3.8 mmol/L (ref 3.5–5.1)
Sodium: 143 mmol/L (ref 135–145)

## 2016-03-16 LAB — PROTIME-INR
INR: 1.16 (ref 0.00–1.49)
PROTHROMBIN TIME: 15 s (ref 11.6–15.2)

## 2016-03-16 MED ORDER — POTASSIUM CHLORIDE CRYS ER 10 MEQ PO TBCR: 20.0000 meq | EXTENDED_RELEASE_TABLET | Freq: Every day | ORAL | Status: DC

## 2016-03-16 MED ORDER — VITAL 1.5 CAL PO LIQD: 1100.0000 mL | ORAL | Status: DC

## 2016-03-16 MED ORDER — ONDANSETRON HCL 4 MG PO TABS: 4.0000 mg | ORAL_TABLET | Freq: Three times a day (TID) | ORAL | Status: DC | PRN

## 2016-03-16 MED ORDER — WARFARIN SODIUM 4 MG PO TABS
4.0000 mg | ORAL_TABLET | Freq: Once | ORAL | Status: DC
  Filled 2016-03-16: qty 1

## 2016-03-16 NOTE — Progress Notes (Signed)
Patient ID: SARIN VERRETT, female   DOB: 1934-02-20, 82 y.o.   MRN: [id number] 21 Days Post-Op  Subjective: Had some post tussive emesis at 1 AM.  1 x emesis yesterday.  Not throwing up food.      Objective: Vital signs in last 24 hours: Temp:  [98.2 F (36.8 C)-98.7 F (37.1 C)] 98.6 F (37 C) (07/11 0510) Pulse Rate:  [76-98] 98 (07/11 0510) Resp:  [17-18] 17 (07/11 0510) BP: (115-160)/(55-81) 160/74 mmHg (07/11 0510) SpO2:  [97 %-99 %] 98 % (07/11 0510) Weight:  [65.545 kg (144 lb 8 oz)] 65.545 kg (144 lb 8 oz) (07/11 0510) Last BM Date: 03/15/16 600 Po 775 urine Stool x 1 Afebrile, VSS K+ 3.0 CBC is stable Intake/Output from previous day: 07/10 0701 - 07/11 0700 In: 2951.1 [P.O.:720; I.V.:10; NG/GT:2221.1] Out: 1475 [Urine:1475] Intake/Output this shift:    General appearance: alert, cooperative and no distress GI: soft, non distended.  J tube in place.  Hematoma smaller  Lab Results:   Recent Labs  03/15/16 0448 03/16/16 0500  WBC 5.8 5.8  HGB 10.2* 10.8*  HCT 33.6* 36.7  PLT 225 230    BMET  Recent Labs  03/15/16 0448 03/16/16 0500  NA 142 143  K 3.7 3.8  CL 107 102  CO2 29 31  GLUCOSE 104* 116*  BUN 15 17  CREATININE 0.78 0.86  CALCIUM 8.3* 8.7*   PT/INR  Recent Labs  03/15/16 0448 03/16/16 0500  LABPROT 14.6 15.0  INR 1.12 1.16    No results for input(s): AST, ALT, ALKPHOS, BILITOT, PROT, ALBUMIN in the last 168 hours.   Lipase     Component Value Date/Time   LIPASE 48 02/17/2016 1509     Studies/Results: No results found.  Medications: . diltiazem  180 mg Oral Daily  . dronabinol  2.5 mg Oral QAC lunch  . enoxaparin (LOVENOX) injection  40 mg Subcutaneous Q24H  . feeding supplement (VITAL 1.5 CAL)  1,100 mL Per Tube Q24H  . furosemide  40 mg Oral Daily  .  HYDROmorphone (DILAUDID) injection  0.5 mg Intravenous Once  . insulin aspart  0-9 Units Subcutaneous Q4H  . levothyroxine  50 mcg Oral QAC breakfast  . lidocaine   1 patch Transdermal Q24H  . metoprolol tartrate  50 mg Oral BID  . ondansetron  4 mg Intravenous Q6H  . pantoprazole sodium  40 mg Per Tube BID  . pregabalin  75 mg Oral BID  . simethicone  80 mg Oral QID  . sucralfate  1 g Oral TID WC & HS  . Warfarin - Pharmacist Dosing Inpatient   Does not apply q1800     Assessment/Plan Gastric cancer S/p open gastrectomy 02/05/16, esophagitis PO intolerance with Jejunostomy tube placement 02/24/16,  Multiple medical issues DNR FEN: Feeding tube/full liquids Goal is 55 ml per hour for 20 hours.  Can titrate at home or decrease hours as PO intake increases OK to give liquid meds in tube, but not solid meds in liquids.   ID: None DVT:on warfarin   Plan: OK for home today.     LOS: 26 days    [name] 03/16/2016

## 2016-03-16 NOTE — Progress Notes (Signed)
Pt discharged to home.  Discharge instructions explained to pt.  Pt and family shown how to administer medications through J tube and how to flush.  Pt states she has all belongings.  IV removed.  Pt taken off unit via wheelchair by staff.

## 2016-03-16 NOTE — Progress Notes (Signed)
Per Dr. Irene Limbo: Wishes to see patient 10-14 days after hospital discharge. Went home today. POF to scheduler for 2 week follow up.

## 2016-03-16 NOTE — Progress Notes (Addendum)
ANTICOAGULATION CONSULT NOTE - Follow Up Consult  Pharmacy Consult:  Coumadin Indication: atrial fibrillation  Allergies  Allergen Reactions  . Amiodarone Other (See Comments)     Thyroid and liver and kidney problems  . Codeine Nausea And Vomiting  . Gabapentin Itching  . Hydrocodone Other (See Comments)    hallucination  . Morphine And Related Nausea And Vomiting  . Requip [Ropinirole Hcl]     Headache   . Zinc Nausea Only    nausea  . Reglan [Metoclopramide] Hives, Itching and Rash    Made face red, also    Patient Measurements: Height: 5\' 1"  (154.9 cm) Weight: 144 lb 8 oz (65.545 kg) IBW/kg (Calculated) : 47.8  Vital Signs: Temp: 98.6 F (37 C) (07/11 0510) Temp Source: Oral (07/11 0510) BP: 162/71 mmHg (07/11 0935) Pulse Rate: 83 (07/11 0935)  Labs:  Recent Labs  03/14/16 0430 03/15/16 0448 03/16/16 0500  HGB 9.9* 10.2* 10.8*  HCT 33.5* 33.6* 36.7  PLT 230 225 230  LABPROT 15.2 14.6 15.0  INR 1.19 1.12 1.16  CREATININE 0.85 0.78 0.86    Estimated Creatinine Clearance: 45.2 mL/min (by C-G formula based on Cr of 0.86).     Assessment: 19 YOM continues on Coumadin and Lovenox for history of AFib.  INR currently sub-therapeutic; no bleeding reported.  Has been conservative with  Coumadin dosing given recent hematoma.  Home Coumadin dose: 2mg  daily    Goal of Therapy:  INR 2-3    Plan:  - Increase Coumadin to 4mg  PO today - Continue Lovenox 40mg  SQ Q24H until INR therapeutic - Daily PT / INR   Jeffree Cazeau D. Mina Marble, PharmD, BCPS Pager:  [phone] 03/16/2016, 11:20 AM

## 2016-03-16 NOTE — Discharge Summary (Signed)
Physician Discharge Summary  [name] EAV:[id number] DOB: 11-Jan-1934 DOA: 02/17/2016  PCP: [name], MD  Admit date: 02/17/2016 Discharge date: 03/16/2016  Admitted From: Home Disposition:  Home  Recommendations for Outpatient Follow-up:  1. Follow up with PCP in 1-2 weeks 2. Please obtain BMP/CBC in one week  Home Health: yes Equipment/Devices: Tube feeding.  Discharge Condition: Stable CODE STATUS: Code Diet recommendation: Heart Healthy  Brief/Interim Summary: [name] is a 82 y.o. female with medical history significant for but not limited to, atrial fibrillation, CKD, HTN, kidney cancer and gastric cancer. She is status post proximal gastrectomy early May for gastric adenocarcinoma. She was discharged home late May on twice daily PPI and Carafate. She was tolerating soft diet. Patient was readmitted several days later for nausea and vomiting. During that admission she underwent upper endoscopy with findings of grade D esophagitis and 2 small gastric ulcers at the anastomosis. She was given TNA during that admission.. Patient was discharged home 02/13/16 on twice a day PPI and metoclopramide but was unable to tolerate metoclopramide secondary to hives so instead took Maalox. Patient was compliant at home with twice daily PPI and antireflux measures but this am patient began vomiting again. She had been "spitting up" but that turned to vomiting this am. She has had burning discomfort in her throat and chest since last admission. No abdominal pain. Stool chronically loose and unchanged.  ED Course:   HR 122, labs IV Zofran Contrast CT scan the abdomen and pelvis-no acute findings just expected postop changes of distal esophagus.  Discharge Diagnoses:  Principal Problem:   Gastroesophageal reflux disease with esophagitis Active Problems:   Hypothyroidism   Atrial fibrillation (HCC) Chadsvasc 5   Aortic stenosis, moderate   Cardiac pacemaker  (06/2010)   Essential hypertension   Adenocarcinoma of stomach (HCC)   Intractable nausea and vomiting   Dehydration   AKI (acute kidney injury) ([place])   S/P partial gastrectomy   No blood products   Surgical site Hematoma   Feeding difficulties   Nausea with vomiting  This is a discharge summary to prolonged and complex hospital stay, patient admitted on 02/17/2016 and discharged after hospital stay of of 26 days, she suffered from nausea, vomiting and intolerance of tube feeding back and forth between multiple antiemetics, as time goes by she started to tolerate some soft diet but she still was discharged home on tube feeding to follow-up with Dr. Mikle Bosworth as outpatient.  1. Gastric adenocarcinoma/Recent subtotal gastrectomy for gastric adenocarcinoma May 2017 -with Intractable nausea and vomiting since mid May -last EGD 6/1 Dr.Stark with Grade D esophagitis and small non bleeding gastric ulcers at anastomoses -Weaned off TNA  -Recurrent admissions with post op N/V -S/P J-tube placement 02/24/2016. -Has Been on IV PPI, Pepcid, also completed 2 weeks of IV FLuconazole for Oral thrush. -Discussed with [name], okay to discontinue the erythromycin and minimize medications. -GI reconsulted, recommended discontinue Phenergan, schedule the Zofran and Carafate, keep head elevated. -Per [name] cyclical to feeding, she will be on Jevity 1.2 @ 55 mL / hour for 20 hours. -Diet advance to soft diet. Per [name] she can decrease tube feeding as she tolerates regular diet.  2. Paroxysmal A. Fib/Pacemaker implant -Chadsvasc 5. On warfarin at home -Currently rate controlled. Continue Cardizem when necessary,  -Medications back to normal, warfarin restarted, INR is 1.1 on discharge, I do not think bridging is necessitated, because of recent postoperative bleeding.  3. Hypothyroidism. -on IV synthroid, will  switch to oral Synthroid see if she tolerates them.  4. Postoperative blood loss  anemia. -Surgical site hematoma, stable -No evidence of active bleeding and component of hemodilution -Patient refused blood transfusion,  -S/P IV Feraheme 5101, [place] Aranesp 1 as well as injection B-12 on 02/27/2016, after discussion with patient's hematology Dr Irene Limbo. -CT abdomen showed evidence of small surgical site hematoma what appears to be stable at present. -off anticoagulation, ok to restart Warfarin d/w CCS.  5. Dehydration. -Mild renal insufficiency earlier during hospital stay. -on TNA, resolved  6. Adult failure to thrive -Currently on tube feeding.  7. Mild Volume overload -Given IV Lasix intermittently, oral Lasix restarted on discharge.  8. Hypokalemia -Repleted with enteral supplements. Charge on 20 mEq of potassium daily.   Discharge Instructions  Discharge Instructions    Diet - low sodium heart healthy    Complete by:  As directed      Increase activity slowly    Complete by:  As directed             Medication List    STOP taking these medications        methocarbamol 500 MG tablet  Commonly known as:  ROBAXIN     ondansetron 4 MG disintegrating tablet  Commonly known as:  ZOFRAN-ODT      TAKE these medications        acetaminophen 325 MG tablet  Commonly known as:  TYLENOL  Take 1 tablet (325 mg total) by mouth every 6 (six) hours as needed for mild pain, moderate pain, fever or headache.     ALPRAZolam 0.5 MG tablet  Commonly known as:  XANAX  TAKE 1/2-1 TABLET BY MOUTH 3 TIMES A DAY AS NEEDED FOR ANXIETY     B-complex with vitamin C tablet  Take 1 tablet by mouth daily.     Biotin 5000 MCG Caps  Take 5,000 mcg by mouth every morning.     cetirizine 10 MG tablet  Commonly known as:  ZYRTEC  Take 10 mg by mouth at bedtime.     Cyanocobalamin 1000 MCG/ML Kit  Inject 1,000 mcg into the skin as directed.     diltiazem 180 MG 24 hr capsule  Commonly known as:  CARDIZEM CD  Take 1 capsule (180 mg total) by mouth daily.      feeding supplement (VITAL 1.5 CAL) Liqd  Place 1,100 mLs into feeding tube daily.     furosemide 40 MG tablet  Commonly known as:  LASIX  TAKE 1 TABLET (40 MG TOTAL) BY MOUTH DAILY.     levothyroxine 100 MCG tablet  Commonly known as:  SYNTHROID, LEVOTHROID  TAKE 1/2 TABLET (50 MCG TOTAL) BY MOUTH EVERY DAY ON EMPTY STOMACH WITH A FULL GLASS OF WATER     LYRICA 75 MG capsule  Generic drug:  pregabalin  TAKE ONE CAPSULE BY MOUTH TWICE A DAY     metoprolol 100 MG tablet  Commonly known as:  LOPRESSOR  Take 1 tablet (100 mg total) by mouth 2 (two) times daily.     multivitamin tablet  Take 1 tablet by mouth daily.     nitroGLYCERIN 0.4 MG SL tablet  Commonly known as:  NITROSTAT  Place 1 tablet (0.4 mg total) under the tongue every 5 (five) minutes as needed for chest pain (MAX 3 TABLETS).     ondansetron 4 MG tablet  Commonly known as:  ZOFRAN  Take 1 tablet (4 mg total) by mouth every 8 (eight)  hours as needed for nausea or vomiting.     pantoprazole 40 MG tablet  Commonly known as:  PROTONIX  TAKE 1 TABLET (40 MG TOTAL) BY MOUTH 2 (TWO) TIMES DAILY.     potassium chloride 10 MEQ tablet  Commonly known as:  K-DUR,KLOR-CON  Take 2 tablets (20 mEq total) by mouth daily.     sertraline 50 MG tablet  Commonly known as:  ZOLOFT  Take 1 tablet (50 mg total) by mouth daily. For mood     traMADol 50 MG tablet  Commonly known as:  ULTRAM  Take 1 tablet (50 mg total) by mouth every 6 (six) hours as needed (pain). Reported on 10/14/2015     VITAMIN D PO  Take 5,000 Units by mouth every morning.     warfarin 2 MG tablet  Commonly known as:  COUMADIN  TAKE 1 TABLET DAILY OR AS DIRECTED           Follow-up Information    Follow up with [facility], [place] In 2 weeks.   Specialty:  General Surgery   Contact information:   [place] [address] [phone]       Follow up with [name], MD In 1 week.   Specialty:  Internal Medicine    Contact information:   [address] [place] [address] [phone]      Allergies  Allergen Reactions  . Amiodarone Other (See Comments)     Thyroid and liver and kidney problems  . Codeine Nausea And Vomiting  . Gabapentin Itching  . Hydrocodone Other (See Comments)    hallucination  . Morphine And Related Nausea And Vomiting  . Requip [Ropinirole Hcl]     Headache   . Zinc Nausea Only    nausea  . Reglan [Metoclopramide] Hives, Itching and Rash    Made face red, also    Consultations:  Gen. surgery, Dr. Cher Nakai.  [facility]   Procedures/Studies: Ct Abdomen Pelvis W Contrast  03/01/2016  CLINICAL DATA:  Postop from subtotal gastrectomy and J-tube placement for gastric carcinoma. Persistent nausea and vomiting. Previous left nephrectomy for renal cell carcinoma. EXAM: CT ABDOMEN AND PELVIS WITH CONTRAST TECHNIQUE: Multidetector CT imaging of the abdomen and pelvis was performed using the standard protocol following bolus administration of intravenous contrast. CONTRAST:  167m ISOVUE-300 IOPAMIDOL (ISOVUE-300) INJECTION 61% COMPARISON:  02/17/2016 FINDINGS: Lower chest: Tiny right pleural effusion and mild bibasilar atelectasis are increased since prior study. Hepatobiliary: No masses or other significant abnormality. Prior cholecystectomy noted. No evidence of biliary dilatation. Pancreas: No mass, inflammatory changes, or other significant abnormality. Spleen: Within normal limits in size and appearance. Adrenals/Urinary Tract: Adrenal glands are normal in appearance. Right kidney is also normal in appearance. Previous left nephrectomy noted. Unopacified urinary bladder is unremarkable in appearance. Stomach/Bowel: Postop changes from subtotal gastrectomy appears stable. A percutaneous jejunostomy tube is now seen in place. No evidence of bowel obstruction, inflammatory process, or abnormal fluid collections. Vascular/Lymphatic: No  pathologically enlarged lymph nodes. No evidence of abdominal aortic aneurysm. Aortic atherosclerosis noted. Reproductive: Prior hysterectomy noted. Adnexal regions are unremarkable in appearance. Other: New hematoma seen within the anterior abdominal wall subcutaneous tissues at site of surgical incision which measures 3 x 6 cm. Several tiny foci of intraperitoneal air noted just deep to the anterior abdominal wall subcutaneous tissues, consistent with minimal residual postop intraperitoneal air. Musculoskeletal: Beam hardening artifact through the inferior pelvis from right hip prosthesis. No suspicious bone lesions  identified. IMPRESSION: New hematoma seen anterior abdominal wall subcutaneous tissues at surgical incision site measuring 3 x 6 cm. No evidence of intraperitoneal hematoma or abscess Increased tiny left pleural effusion and bibasilar atelectasis. Aortic atherosclerosis noted. Electronically Signed   By: Earle Gell M.D.   On: 03/01/2016 15:32   Ct Abdomen Pelvis W Contrast  02/17/2016  CLINICAL DATA:  Pt. Presents with complaint of abd pain and esophageal pain. Per pt. She has had stomach CA with recent surgery last month. Pt. States no oncology follow up, surgery only. Pt. Reports decreased appetite, N/V starting this AM around 0930. Pt. Had 3 episodes of emesis this AM. Pt. Has hx of afib, taking coumadin, h/o left renal ca, h/o nephrectomy, gastrectomy EXAM: CT ABDOMEN AND PELVIS WITH CONTRAST TECHNIQUE: Multidetector CT imaging of the abdomen and pelvis was performed using the standard protocol following bolus administration of intravenous contrast. CONTRAST:  46m ISOVUE-300 IOPAMIDOL (ISOVUE-300) INJECTION 61% COMPARISON:  11/12/2015. FINDINGS: Lung bases: Minor scarring or atelectasis the left lower lobe. Minor scarring in adjacent left upper lobe lingula. No evidence of pneumonia edema. Heart is top-normal in size. Hepatobiliary: Unremarkable liver. Gallbladder surgically absent. No  significant bile duct dilation. Spleen, pancreas, adrenal glands:  Unremarkable. Kidneys, ureters, bladder: Left kidney is surgically absent. Normal right kidney. No hydronephrosis. Normal right ureter. Bladder is unremarkable. Uterus and adnexa:  Uterus surgically absent.  No pelvic masses. Vascular: Set densely calcified atherosclerotic plaque noted along the abdominal aorta and its branch vessels. No aneurysm. Lymph nodes:  No enlarged lymph nodes. Ascites:  None. Gastrointestinal: There is some thickening of the wall of the distal esophagus and the adjacent stomach at the level of the anastomosis. There is no convincing mass. Stomach is mostly decompressed. Small bowel and colon are unremarkable. Abdominal wall:  Midline scar.  No hernia. Musculoskeletal: Well-positioned right hip total arthroplasty. Degenerative changes of the visualized spine. No osteoblastic or osteolytic lesions. IMPRESSION: 1. There is some apparent wall thickening of the distal esophagus and adjacent stomach at the gastroesophageal anastomosis. This may reflect inflammation. There is no convincing stomach or esophageal mass and no evidence of metastatic disease. 2. No other findings to account this patient's symptoms. 3. Status post cholecystectomy, left nephrectomy and hysterectomy and right hip total arthroplasty. Electronically Signed   By: DLajean ManesM.D.   On: 02/17/2016 16:10   Dg Chest Port 1 View  02/18/2016  CLINICAL DATA:  PICC line placement EXAM: PORTABLE CHEST 1 VIEW COMPARISON:  Radiographs 02/06/2016. FINDINGS: 1808 hours. Interval improved aeration of the lungs which are now clear. The heart size and mediastinal contours are stable with aortic atherosclerosis. There is new left arm PICC which projects to the lower SVC level. Right subclavian pacemaker leads appear unchanged in the right atrium and right ventricle. No evidence of pneumothorax or significant pleural effusion. IMPRESSION: New left arm PICC projects to  the lower SVC level. Interval clearing of the lungs. Electronically Signed   By: WRichardean SaleM.D.   On: 02/18/2016 17:42   Dg Abd Portable 1v  02/29/2016  CLINICAL DATA:  Nausea and vomiting. Jejunostomy tube placed 02/24/2016 EXAM: PORTABLE ABDOMEN - 1 VIEW COMPARISON:  02/17/2016 CT abdomen/ pelvis. FINDINGS: Midline abdominal skin staples. Bilateral deep pelvic surgical clips. Right upper quadrant cholecystectomy clips. Partially visualized right total hip arthroplasty. Jejunostomy tube terminates in the left abdomen. No dilated small bowel loops. Mild gaseous distention of the colon. No evidence of pneumatosis or pneumoperitoneum. Moderate degenerative changes in the visualized  thoracolumbar spine. Lung bases appear clear. Aortic atherosclerosis. IMPRESSION: 1. Jejunostomy tube terminates in the left abdomen. No evidence of small-bowel obstruction . 2. Mild gaseous distention of the colon, which may indicate a mild adynamic ileus . 3. No evidence of pneumatosis or pneumoperitoneum. 4. Aortic atherosclerosis. Electronically Signed   By: Ilona Sorrel M.D.   On: 02/29/2016 10:55   Dg Hip Unilat With Pelvis 2-3 Views Right  02/21/2016  CLINICAL DATA:  Right hip pain.  Renal cell cancer. EXAM: DG HIP (WITH OR WITHOUT PELVIS) 2-3V RIGHT COMPARISON:  02/17/2016 CT abdomen/pelvis. FINDINGS: Status post right total hip arthroplasty, with no evidence of hardware fracture or loosening. No right hip dislocation. Diffuse osteopenia. No osseous fracture or suspicious focal osseous lesion. Bilateral deep pelvic surgical clips. Degenerative changes in the visualized lower lumbar spine. IMPRESSION: No osseous fracture. No right hip dislocation. Right total hip arthroplasty, with no acute hardware complication. Diffuse osteopenia. Electronically Signed   By: Ilona Sorrel M.D.   On: 02/21/2016 12:19    (Echo, Carotid, EGD, Colonoscopy, ERCP)    Subjective:   Discharge Exam: Filed Vitals:   03/16/16 0510  03/16/16 0935  BP: 160/74 162/71  Pulse: 98 83  Temp: 98.6 F (37 C)   Resp: 17    Filed Vitals:   03/15/16 1545 03/15/16 2048 03/16/16 0510 03/16/16 0935  BP: 115/55 156/81 160/74 162/71  Pulse: 76 98 98 83  Temp: 98.7 F (37.1 C) 98.2 F (36.8 C) 98.6 F (37 C)   TempSrc: Oral Oral Oral   Resp: _0 Height:      Weight:   65.545 kg (144 lb 8 oz)   SpO2: 97% 99% 98%     General: Pt is alert, awake, not in acute distress Cardiovascular: RRR, S1/S2 +, no rubs, no gallops Respiratory: CTA bilaterally, no wheezing, no rhonchi Abdominal: Soft, NT, ND, bowel sounds + Extremities: no edema, no cyanosis    The results of significant diagnostics from this hospitalization (including imaging, microbiology, ancillary and laboratory) are listed below for reference.     Microbiology: No results found for this or any previous visit (from the past 240 hour(s)).   Labs: BNP (last 3 results) No results for input(s): BNP in the last 8760 hours. Basic Metabolic Panel:  Recent Labs Lab 03/12/16 0425 03/13/16 0525 03/14/16 0430 03/15/16 0448 03/16/16 0500  NA 142 141 142 142 143  K 3.6 3.6 3.0* 3.7 3.8  CL 109 107 106 107 102  CO2 _1 GLUCOSE 115* 107* 106* 104* 116*  BUN _2 CREATININE 0.67 0.70 0.85 0.78 0.86  CALCIUM 8.3* 8.6* 8.3* 8.3* 8.7*  MG 1.7  --   --  2.1  --    Liver Function Tests: No results for input(s): AST, ALT, ALKPHOS, BILITOT, PROT, ALBUMIN in the last 168 hours. No results for input(s): LIPASE, AMYLASE in the last 168 hours. No results for input(s): AMMONIA in the last 168 hours. CBC:  Recent Labs Lab 03/12/16 0425 03/13/16 0525 03/14/16 0430 03/15/16 0448 03/16/16 0500  WBC 6.4 6.1 6.0 5.8 5.8  HGB 10.1* 10.5* 9.9* 10.2* 10.8*  HCT 34.1* 35.7* 33.5* 33.6* 36.7  MCV 104.9* 104.4* 105.3* 103.4* 103.4*  PLT 249 258 230 225 230   Cardiac Enzymes: No results for input(s): CKTOTAL, CKMB, CKMBINDEX, TROPONINI in the  last 168 hours. BNP: Invalid input(s): POCBNP CBG:  Recent Labs Lab 03/15/16 2001 03/16/16 0020 03/16/16  0768 03/16/16 0738 03/16/16 1133  GLUCAP 146* 116* 134* 127* 101*   D-Dimer No results for input(s): DDIMER in the last 72 hours. Hgb A1c No results for input(s): HGBA1C in the last 72 hours. Lipid Profile No results for input(s): CHOL, HDL, LDLCALC, TRIG, CHOLHDL, LDLDIRECT in the last 72 hours. Thyroid function studies No results for input(s): TSH, T4TOTAL, T3FREE, THYROIDAB in the last 72 hours.  Invalid input(s): FREET3 Anemia work up No results for input(s): VITAMINB12, FOLATE, FERRITIN, TIBC, IRON, RETICCTPCT in the last 72 hours. Urinalysis    Component Value Date/Time   COLORURINE YELLOW 02/04/2016 0349   APPEARANCEUR CLOUDY* 02/04/2016 0349   LABSPEC 1.023 02/04/2016 0349   PHURINE 5.5 02/04/2016 0349   GLUCOSEU NEGATIVE 02/04/2016 0349   HGBUR MODERATE* 02/04/2016 0349   BILIRUBINUR NEGATIVE 02/04/2016 0349   KETONESUR NEGATIVE 02/04/2016 0349   PROTEINUR 30* 02/04/2016 0349   UROBILINOGEN 0.2 07/22/2014 1120   NITRITE POSITIVE* 02/04/2016 0349   LEUKOCYTESUR MODERATE* 02/04/2016 0349   Sepsis Labs Invalid input(s): PROCALCITONIN,  WBC,  LACTICIDVEN Microbiology No results found for this or any previous visit (from the past 240 hour(s)).   Time coordinating discharge: Over 30 minutes  SIGNED:   Birdie Hopes, MD  [facility] 03/16/2016, 12:57 PM Pager   If 7PM-7AM, please contact night-coverage [email or web address] Password [email or web address]

## 2016-03-17 DIAGNOSIS — R1310 Dysphagia, unspecified: Secondary | ICD-10-CM | POA: Diagnosis not present

## 2016-03-17 DIAGNOSIS — Z483 Aftercare following surgery for neoplasm: Secondary | ICD-10-CM | POA: Diagnosis not present

## 2016-03-17 DIAGNOSIS — Z7901 Long term (current) use of anticoagulants: Secondary | ICD-10-CM | POA: Diagnosis not present

## 2016-03-17 DIAGNOSIS — I482 Chronic atrial fibrillation: Secondary | ICD-10-CM | POA: Diagnosis not present

## 2016-03-17 DIAGNOSIS — C169 Malignant neoplasm of stomach, unspecified: Secondary | ICD-10-CM | POA: Diagnosis not present

## 2016-03-18 ENCOUNTER — Telehealth: Payer: Self-pay | Admitting: Hematology

## 2016-03-18 ENCOUNTER — Other Ambulatory Visit: Payer: Self-pay | Admitting: Internal Medicine

## 2016-03-18 DIAGNOSIS — Z7901 Long term (current) use of anticoagulants: Secondary | ICD-10-CM | POA: Diagnosis not present

## 2016-03-18 DIAGNOSIS — I482 Chronic atrial fibrillation: Secondary | ICD-10-CM | POA: Diagnosis not present

## 2016-03-18 DIAGNOSIS — Z483 Aftercare following surgery for neoplasm: Secondary | ICD-10-CM | POA: Diagnosis not present

## 2016-03-18 DIAGNOSIS — R1310 Dysphagia, unspecified: Secondary | ICD-10-CM | POA: Diagnosis not present

## 2016-03-18 DIAGNOSIS — C169 Malignant neoplasm of stomach, unspecified: Secondary | ICD-10-CM | POA: Diagnosis not present

## 2016-03-18 NOTE — Telephone Encounter (Signed)
Spoke with pt daughter to confirm 7/25 appt at 1 pm

## 2016-03-19 ENCOUNTER — Ambulatory Visit (INDEPENDENT_AMBULATORY_CARE_PROVIDER_SITE_OTHER): Payer: Medicare Other | Admitting: Cardiology

## 2016-03-19 ENCOUNTER — Other Ambulatory Visit: Payer: Self-pay | Admitting: Internal Medicine

## 2016-03-19 ENCOUNTER — Ambulatory Visit: Payer: Self-pay | Admitting: Internal Medicine

## 2016-03-19 ENCOUNTER — Telehealth: Payer: Self-pay | Admitting: Internal Medicine

## 2016-03-19 DIAGNOSIS — R1115 Cyclical vomiting syndrome unrelated to migraine: Secondary | ICD-10-CM

## 2016-03-19 DIAGNOSIS — R1310 Dysphagia, unspecified: Secondary | ICD-10-CM | POA: Diagnosis not present

## 2016-03-19 DIAGNOSIS — C169 Malignant neoplasm of stomach, unspecified: Secondary | ICD-10-CM | POA: Diagnosis not present

## 2016-03-19 DIAGNOSIS — Z7901 Long term (current) use of anticoagulants: Secondary | ICD-10-CM | POA: Insufficient documentation

## 2016-03-19 DIAGNOSIS — I482 Chronic atrial fibrillation: Secondary | ICD-10-CM | POA: Diagnosis not present

## 2016-03-19 DIAGNOSIS — I480 Paroxysmal atrial fibrillation: Secondary | ICD-10-CM

## 2016-03-19 DIAGNOSIS — Z483 Aftercare following surgery for neoplasm: Secondary | ICD-10-CM | POA: Diagnosis not present

## 2016-03-19 LAB — POCT INR: INR: 1.1

## 2016-03-19 MED ORDER — DRONABINOL 2.5 MG PO CAPS: 2.5000 mg | ORAL_CAPSULE | Freq: Two times a day (BID) | ORAL | Status: DC

## 2016-03-19 NOTE — Patient Outreach (Signed)
This referral to [facility] CM Raina Mina was cancelled by Dr. Posey Pronto.

## 2016-03-19 NOTE — Telephone Encounter (Signed)
Spoke with pts daughter and script faxed to pharmacy.

## 2016-03-19 NOTE — Telephone Encounter (Signed)
OK 

## 2016-03-19 NOTE — Telephone Encounter (Signed)
Dr. Carlean Purl please see question about Marinol and Arnoldo Hooker

## 2016-03-19 NOTE — Telephone Encounter (Signed)
Pts daughter states her mother is still vomiting up "bile." States Dr. Henrene Pastor gave her mother Rudi Heap in the hospital. Pt still having problems vomiting and is requesting a prescription for Marinol. Pt had EGD and colon with Dr. Carlean Purl in March. Previous Deatra Ina pt. Dr. Carlean Purl please advise regarding Marinol script.

## 2016-03-22 ENCOUNTER — Telehealth: Payer: Self-pay | Admitting: Internal Medicine

## 2016-03-22 NOTE — Telephone Encounter (Signed)
Pt's daughter called back and you will need to call [phone] for Prior Auth.  If you have a problem call the daughter back

## 2016-03-22 NOTE — Telephone Encounter (Signed)
I called the number below to do the prior authorization for generic marinol with [employer].  Spoke with Doren Custard , questions asked and answered (tried and failed zofran, metoclopramide, phenergan messes with her restless legs) and he said this has been sent for pharmacy review and they will send [place] a fax with approval or denial. Called and let daughter [name] to let her know the status of the prior authorization.

## 2016-03-22 NOTE — Telephone Encounter (Signed)
Called pharmacy and gave them the following codes: R11.0 Nausea and vomitting in Adult, Z90.3  S/p partial gastrectomy, the pharmacist ran it through and it went and should be much less expensive , daughter notified.

## 2016-03-22 NOTE — Telephone Encounter (Signed)
Spoke with the daughter Beverlee Nims and told her I called and the insurance company to try and start a prior authorization and they transferred me around and then they hung up on me so I called the pharmacy back and they  hold on that they need her supplemental insurance card to try and run it and then we'll go from there.

## 2016-03-23 ENCOUNTER — Ambulatory Visit (INDEPENDENT_AMBULATORY_CARE_PROVIDER_SITE_OTHER): Payer: Medicare Other | Admitting: Interventional Cardiology

## 2016-03-23 ENCOUNTER — Other Ambulatory Visit: Payer: Self-pay | Admitting: Internal Medicine

## 2016-03-23 DIAGNOSIS — R1310 Dysphagia, unspecified: Secondary | ICD-10-CM | POA: Diagnosis not present

## 2016-03-23 DIAGNOSIS — I482 Chronic atrial fibrillation: Secondary | ICD-10-CM | POA: Diagnosis not present

## 2016-03-23 DIAGNOSIS — R112 Nausea with vomiting, unspecified: Secondary | ICD-10-CM

## 2016-03-23 DIAGNOSIS — Z9889 Other specified postprocedural states: Principal | ICD-10-CM

## 2016-03-23 DIAGNOSIS — Z483 Aftercare following surgery for neoplasm: Secondary | ICD-10-CM | POA: Diagnosis not present

## 2016-03-23 DIAGNOSIS — Z7901 Long term (current) use of anticoagulants: Secondary | ICD-10-CM | POA: Diagnosis not present

## 2016-03-23 DIAGNOSIS — C169 Malignant neoplasm of stomach, unspecified: Secondary | ICD-10-CM | POA: Diagnosis not present

## 2016-03-23 DIAGNOSIS — I480 Paroxysmal atrial fibrillation: Secondary | ICD-10-CM

## 2016-03-23 LAB — POCT INR: INR: 1.9

## 2016-03-23 MED ORDER — SCOPOLAMINE 1 MG/3DAYS TD PT72: 1.0000 | MEDICATED_PATCH | TRANSDERMAL | Status: DC

## 2016-03-23 NOTE — Telephone Encounter (Signed)
Spoke with daughter Beverlee Nims and told her that Dr Carlean Purl sent in transderm scop patches for mom to try.  Beverlee Nims said they are just going to pay out of pocket $250.00 for the Dronabinol.  The nurse came to the house today and she is going to reach out to Dr Barry Dienes about possible changes to how she takes her meds to see if that will help her.

## 2016-03-23 NOTE — Telephone Encounter (Signed)
Completed and faxed more information to [employer] for the prior authorization.

## 2016-03-23 NOTE — Telephone Encounter (Signed)
Dronabinol has been denied because it doesn't meet the Medicare Part D requirements.  Please advise an alternative rx Sir.  Thanks.

## 2016-03-24 ENCOUNTER — Telehealth: Payer: Self-pay | Admitting: Internal Medicine

## 2016-03-24 DIAGNOSIS — Z7901 Long term (current) use of anticoagulants: Secondary | ICD-10-CM | POA: Diagnosis not present

## 2016-03-24 DIAGNOSIS — C169 Malignant neoplasm of stomach, unspecified: Secondary | ICD-10-CM | POA: Diagnosis not present

## 2016-03-24 DIAGNOSIS — I482 Chronic atrial fibrillation: Secondary | ICD-10-CM | POA: Diagnosis not present

## 2016-03-24 DIAGNOSIS — R1310 Dysphagia, unspecified: Secondary | ICD-10-CM | POA: Diagnosis not present

## 2016-03-24 DIAGNOSIS — Z483 Aftercare following surgery for neoplasm: Secondary | ICD-10-CM | POA: Diagnosis not present

## 2016-03-24 NOTE — Telephone Encounter (Signed)
Should be able to but need to get Dr. Marlowe Aschoff input

## 2016-03-24 NOTE — Telephone Encounter (Signed)
Patient's daughter notified.

## 2016-03-24 NOTE — Telephone Encounter (Signed)
Daughter called to report that her mother is not able to swallow her medications.  She is not able to crush all of her meds , but even the ones she is able to crush she is having trouble getting down with applesauce.  Do you know of a reason she can't put meds through J-tube?  The home health nurse has a call into [name] to ask the same question.    Once she swallows the pills she is bringing them back up.  Daughter states that it is not a matter of her vomiting, but that she is not able to swallow them at all.  Please advise

## 2016-03-25 DIAGNOSIS — I482 Chronic atrial fibrillation: Secondary | ICD-10-CM | POA: Diagnosis not present

## 2016-03-25 DIAGNOSIS — R1310 Dysphagia, unspecified: Secondary | ICD-10-CM | POA: Diagnosis not present

## 2016-03-25 DIAGNOSIS — Z7901 Long term (current) use of anticoagulants: Secondary | ICD-10-CM | POA: Diagnosis not present

## 2016-03-25 DIAGNOSIS — C169 Malignant neoplasm of stomach, unspecified: Secondary | ICD-10-CM | POA: Diagnosis not present

## 2016-03-25 DIAGNOSIS — Z483 Aftercare following surgery for neoplasm: Secondary | ICD-10-CM | POA: Diagnosis not present

## 2016-03-26 DIAGNOSIS — C169 Malignant neoplasm of stomach, unspecified: Secondary | ICD-10-CM | POA: Diagnosis not present

## 2016-03-26 DIAGNOSIS — Z7901 Long term (current) use of anticoagulants: Secondary | ICD-10-CM | POA: Diagnosis not present

## 2016-03-26 DIAGNOSIS — R1310 Dysphagia, unspecified: Secondary | ICD-10-CM | POA: Diagnosis not present

## 2016-03-26 DIAGNOSIS — Z79891 Long term (current) use of opiate analgesic: Secondary | ICD-10-CM | POA: Diagnosis not present

## 2016-03-26 DIAGNOSIS — K210 Gastro-esophageal reflux disease with esophagitis: Secondary | ICD-10-CM | POA: Diagnosis not present

## 2016-03-26 DIAGNOSIS — Z483 Aftercare following surgery for neoplasm: Secondary | ICD-10-CM | POA: Diagnosis not present

## 2016-03-26 DIAGNOSIS — I482 Chronic atrial fibrillation: Secondary | ICD-10-CM | POA: Diagnosis not present

## 2016-03-27 ENCOUNTER — Encounter: Payer: Self-pay | Admitting: General Surgery

## 2016-03-27 DIAGNOSIS — C169 Malignant neoplasm of stomach, unspecified: Secondary | ICD-10-CM | POA: Diagnosis not present

## 2016-03-27 NOTE — Progress Notes (Unsigned)
Her daughter, [name], called and reported she was having a small amount of greenish drainage from her midline wound.  She has an appointment to see [name] on 03/29/16.  She also reported that she has a wound hematoma and this has been draining some from another area of the wound.  I told her as long as the drainage was the small amount she described, she could discuss it with [name] in 2 days.  If the green drainage increased significantly, she would need to come back to the hospital.

## 2016-03-29 ENCOUNTER — Telehealth: Payer: Self-pay | Admitting: Internal Medicine

## 2016-03-29 DIAGNOSIS — K259 Gastric ulcer, unspecified as acute or chronic, without hemorrhage or perforation: Secondary | ICD-10-CM

## 2016-03-29 NOTE — Telephone Encounter (Signed)
[name] requesting EGD for persistent vomiting after resection of gastric cancer and also to f/o gastric ulcers  Please try to schedule this for Friday at either 0730 or later in day during my gap  Prefer 0730 please  Will leave patient on warfarin  Procedure at [place] favored but can do w/ moderate sedation  Please let me know

## 2016-03-30 ENCOUNTER — Other Ambulatory Visit: Payer: Self-pay | Admitting: *Deleted

## 2016-03-30 ENCOUNTER — Other Ambulatory Visit: Payer: Self-pay

## 2016-03-30 ENCOUNTER — Ambulatory Visit: Payer: Self-pay | Admitting: Hematology

## 2016-03-30 ENCOUNTER — Ambulatory Visit (INDEPENDENT_AMBULATORY_CARE_PROVIDER_SITE_OTHER): Payer: Medicare Other | Admitting: Interventional Cardiology

## 2016-03-30 DIAGNOSIS — I2510 Atherosclerotic heart disease of native coronary artery without angina pectoris: Secondary | ICD-10-CM | POA: Diagnosis not present

## 2016-03-30 DIAGNOSIS — K259 Gastric ulcer, unspecified as acute or chronic, without hemorrhage or perforation: Secondary | ICD-10-CM

## 2016-03-30 DIAGNOSIS — Z934 Other artificial openings of gastrointestinal tract status: Secondary | ICD-10-CM | POA: Diagnosis not present

## 2016-03-30 DIAGNOSIS — I130 Hypertensive heart and chronic kidney disease with heart failure and stage 1 through stage 4 chronic kidney disease, or unspecified chronic kidney disease: Secondary | ICD-10-CM | POA: Diagnosis not present

## 2016-03-30 DIAGNOSIS — I480 Paroxysmal atrial fibrillation: Secondary | ICD-10-CM

## 2016-03-30 DIAGNOSIS — C169 Malignant neoplasm of stomach, unspecified: Secondary | ICD-10-CM | POA: Diagnosis not present

## 2016-03-30 DIAGNOSIS — Z7901 Long term (current) use of anticoagulants: Secondary | ICD-10-CM | POA: Diagnosis not present

## 2016-03-30 DIAGNOSIS — C649 Malignant neoplasm of unspecified kidney, except renal pelvis: Secondary | ICD-10-CM | POA: Diagnosis not present

## 2016-03-30 DIAGNOSIS — D649 Anemia, unspecified: Secondary | ICD-10-CM | POA: Diagnosis not present

## 2016-03-30 DIAGNOSIS — Z79891 Long term (current) use of opiate analgesic: Secondary | ICD-10-CM | POA: Diagnosis not present

## 2016-03-30 DIAGNOSIS — Z483 Aftercare following surgery for neoplasm: Secondary | ICD-10-CM | POA: Diagnosis not present

## 2016-03-30 DIAGNOSIS — K210 Gastro-esophageal reflux disease with esophagitis: Secondary | ICD-10-CM | POA: Diagnosis not present

## 2016-03-30 LAB — POCT INR: INR: 2.8

## 2016-03-30 MED ORDER — METOPROLOL TARTRATE 100 MG PO TABS: 100.0000 mg | ORAL_TABLET | Freq: Two times a day (BID) | ORAL | 2 refills | Status: DC

## 2016-03-30 MED ORDER — DILTIAZEM HCL ER COATED BEADS 180 MG PO CP24: 180.0000 mg | ORAL_CAPSULE | Freq: Every day | ORAL | 3 refills | Status: DC

## 2016-03-30 NOTE — Telephone Encounter (Signed)
Pt's daughter Beverlee Nims is calling back bc Pt is still throwing up bile.  [phone]

## 2016-03-30 NOTE — Telephone Encounter (Signed)
Patient is scheduled for EGD at [facility] for 04/02/16 7:30.  Her daughter verbalized understanding of instructions to hold feedings and nothing by mouth after midnight and to continue her coumadin.

## 2016-03-31 DIAGNOSIS — I130 Hypertensive heart and chronic kidney disease with heart failure and stage 1 through stage 4 chronic kidney disease, or unspecified chronic kidney disease: Secondary | ICD-10-CM | POA: Diagnosis not present

## 2016-03-31 DIAGNOSIS — K259 Gastric ulcer, unspecified as acute or chronic, without hemorrhage or perforation: Secondary | ICD-10-CM | POA: Diagnosis not present

## 2016-03-31 DIAGNOSIS — Z79891 Long term (current) use of opiate analgesic: Secondary | ICD-10-CM | POA: Diagnosis not present

## 2016-03-31 DIAGNOSIS — I480 Paroxysmal atrial fibrillation: Secondary | ICD-10-CM | POA: Diagnosis not present

## 2016-03-31 DIAGNOSIS — C649 Malignant neoplasm of unspecified kidney, except renal pelvis: Secondary | ICD-10-CM | POA: Diagnosis not present

## 2016-03-31 DIAGNOSIS — C169 Malignant neoplasm of stomach, unspecified: Secondary | ICD-10-CM | POA: Diagnosis not present

## 2016-03-31 DIAGNOSIS — Z7901 Long term (current) use of anticoagulants: Secondary | ICD-10-CM | POA: Diagnosis not present

## 2016-03-31 DIAGNOSIS — K210 Gastro-esophageal reflux disease with esophagitis: Secondary | ICD-10-CM | POA: Diagnosis not present

## 2016-03-31 DIAGNOSIS — I2510 Atherosclerotic heart disease of native coronary artery without angina pectoris: Secondary | ICD-10-CM | POA: Diagnosis not present

## 2016-03-31 DIAGNOSIS — D649 Anemia, unspecified: Secondary | ICD-10-CM | POA: Diagnosis not present

## 2016-03-31 DIAGNOSIS — Z483 Aftercare following surgery for neoplasm: Secondary | ICD-10-CM | POA: Diagnosis not present

## 2016-03-31 DIAGNOSIS — Z934 Other artificial openings of gastrointestinal tract status: Secondary | ICD-10-CM | POA: Diagnosis not present

## 2016-04-01 DIAGNOSIS — Z483 Aftercare following surgery for neoplasm: Secondary | ICD-10-CM | POA: Diagnosis not present

## 2016-04-01 DIAGNOSIS — C649 Malignant neoplasm of unspecified kidney, except renal pelvis: Secondary | ICD-10-CM | POA: Diagnosis not present

## 2016-04-01 DIAGNOSIS — I480 Paroxysmal atrial fibrillation: Secondary | ICD-10-CM | POA: Diagnosis not present

## 2016-04-01 DIAGNOSIS — I2510 Atherosclerotic heart disease of native coronary artery without angina pectoris: Secondary | ICD-10-CM | POA: Diagnosis not present

## 2016-04-01 DIAGNOSIS — Z7901 Long term (current) use of anticoagulants: Secondary | ICD-10-CM | POA: Diagnosis not present

## 2016-04-01 DIAGNOSIS — C169 Malignant neoplasm of stomach, unspecified: Secondary | ICD-10-CM | POA: Diagnosis not present

## 2016-04-01 DIAGNOSIS — K210 Gastro-esophageal reflux disease with esophagitis: Secondary | ICD-10-CM | POA: Diagnosis not present

## 2016-04-01 DIAGNOSIS — I130 Hypertensive heart and chronic kidney disease with heart failure and stage 1 through stage 4 chronic kidney disease, or unspecified chronic kidney disease: Secondary | ICD-10-CM | POA: Diagnosis not present

## 2016-04-01 DIAGNOSIS — D649 Anemia, unspecified: Secondary | ICD-10-CM | POA: Diagnosis not present

## 2016-04-01 DIAGNOSIS — Z934 Other artificial openings of gastrointestinal tract status: Secondary | ICD-10-CM | POA: Diagnosis not present

## 2016-04-01 DIAGNOSIS — K259 Gastric ulcer, unspecified as acute or chronic, without hemorrhage or perforation: Secondary | ICD-10-CM | POA: Diagnosis not present

## 2016-04-01 DIAGNOSIS — Z79891 Long term (current) use of opiate analgesic: Secondary | ICD-10-CM | POA: Diagnosis not present

## 2016-04-02 ENCOUNTER — Ambulatory Visit (INDEPENDENT_AMBULATORY_CARE_PROVIDER_SITE_OTHER): Payer: Medicare Other | Admitting: Internal Medicine

## 2016-04-02 ENCOUNTER — Encounter (HOSPITAL_COMMUNITY)
Admission: RE | Disposition: A | Discharge status: Home / Self Care | Payer: Self-pay | Source: Ambulatory Visit | Attending: Internal Medicine

## 2016-04-02 ENCOUNTER — Ambulatory Visit (HOSPITAL_COMMUNITY)
Admission: RE | Admit: 2016-04-02 | Discharge: 2016-04-02 | Disposition: A | Discharge status: Home / Self Care | Payer: Medicare Other | Source: Ambulatory Visit | Attending: Internal Medicine | Admitting: Internal Medicine

## 2016-04-02 ENCOUNTER — Encounter (HOSPITAL_COMMUNITY): Payer: Self-pay | Admitting: *Deleted

## 2016-04-02 ENCOUNTER — Telehealth: Payer: Self-pay

## 2016-04-02 DIAGNOSIS — K210 Gastro-esophageal reflux disease with esophagitis: Secondary | ICD-10-CM | POA: Diagnosis not present

## 2016-04-02 DIAGNOSIS — K911 Postgastric surgery syndromes: Secondary | ICD-10-CM

## 2016-04-02 DIAGNOSIS — I480 Paroxysmal atrial fibrillation: Secondary | ICD-10-CM

## 2016-04-02 DIAGNOSIS — N189 Chronic kidney disease, unspecified: Secondary | ICD-10-CM | POA: Insufficient documentation

## 2016-04-02 DIAGNOSIS — E039 Hypothyroidism, unspecified: Secondary | ICD-10-CM | POA: Insufficient documentation

## 2016-04-02 DIAGNOSIS — Z7901 Long term (current) use of anticoagulants: Secondary | ICD-10-CM | POA: Insufficient documentation

## 2016-04-02 DIAGNOSIS — K259 Gastric ulcer, unspecified as acute or chronic, without hemorrhage or perforation: Secondary | ICD-10-CM

## 2016-04-02 DIAGNOSIS — Z79891 Long term (current) use of opiate analgesic: Secondary | ICD-10-CM | POA: Diagnosis not present

## 2016-04-02 DIAGNOSIS — R1310 Dysphagia, unspecified: Secondary | ICD-10-CM | POA: Insufficient documentation

## 2016-04-02 DIAGNOSIS — I509 Heart failure, unspecified: Secondary | ICD-10-CM | POA: Insufficient documentation

## 2016-04-02 DIAGNOSIS — Z96651 Presence of right artificial knee joint: Secondary | ICD-10-CM | POA: Insufficient documentation

## 2016-04-02 DIAGNOSIS — Z8673 Personal history of transient ischemic attack (TIA), and cerebral infarction without residual deficits: Secondary | ICD-10-CM | POA: Insufficient documentation

## 2016-04-02 DIAGNOSIS — I2510 Atherosclerotic heart disease of native coronary artery without angina pectoris: Secondary | ICD-10-CM | POA: Diagnosis not present

## 2016-04-02 DIAGNOSIS — H548 Legal blindness, as defined in USA: Secondary | ICD-10-CM | POA: Diagnosis not present

## 2016-04-02 DIAGNOSIS — Z79899 Other long term (current) drug therapy: Secondary | ICD-10-CM | POA: Diagnosis not present

## 2016-04-02 DIAGNOSIS — Z905 Acquired absence of kidney: Secondary | ICD-10-CM | POA: Insufficient documentation

## 2016-04-02 DIAGNOSIS — K2950 Unspecified chronic gastritis without bleeding: Secondary | ICD-10-CM | POA: Diagnosis not present

## 2016-04-02 DIAGNOSIS — Z934 Other artificial openings of gastrointestinal tract status: Secondary | ICD-10-CM | POA: Diagnosis not present

## 2016-04-02 DIAGNOSIS — K209 Esophagitis, unspecified: Secondary | ICD-10-CM | POA: Diagnosis not present

## 2016-04-02 DIAGNOSIS — C649 Malignant neoplasm of unspecified kidney, except renal pelvis: Secondary | ICD-10-CM | POA: Diagnosis not present

## 2016-04-02 DIAGNOSIS — I130 Hypertensive heart and chronic kidney disease with heart failure and stage 1 through stage 4 chronic kidney disease, or unspecified chronic kidney disease: Secondary | ICD-10-CM | POA: Diagnosis not present

## 2016-04-02 DIAGNOSIS — Z950 Presence of cardiac pacemaker: Secondary | ICD-10-CM | POA: Insufficient documentation

## 2016-04-02 DIAGNOSIS — R1110 Vomiting, unspecified: Secondary | ICD-10-CM | POA: Diagnosis not present

## 2016-04-02 DIAGNOSIS — D649 Anemia, unspecified: Secondary | ICD-10-CM | POA: Diagnosis not present

## 2016-04-02 DIAGNOSIS — Z483 Aftercare following surgery for neoplasm: Secondary | ICD-10-CM | POA: Diagnosis not present

## 2016-04-02 DIAGNOSIS — F419 Anxiety disorder, unspecified: Secondary | ICD-10-CM | POA: Diagnosis not present

## 2016-04-02 DIAGNOSIS — C169 Malignant neoplasm of stomach, unspecified: Secondary | ICD-10-CM | POA: Diagnosis not present

## 2016-04-02 HISTORY — PX: ESOPHAGOGASTRODUODENOSCOPY: SHX5428

## 2016-04-02 HISTORY — DX: Presence of cardiac pacemaker: Z95.0

## 2016-04-02 LAB — POCT INR: INR: 2.7

## 2016-04-02 SURGERY — EGD (ESOPHAGOGASTRODUODENOSCOPY)
Anesthesia: Moderate Sedation

## 2016-04-02 MED ORDER — FENTANYL CITRATE (PF) 100 MCG/2ML IJ SOLN
INTRAMUSCULAR | Status: DC | PRN
  Administered 2016-04-02 (×2): 25 ug via INTRAVENOUS

## 2016-04-02 MED ORDER — ERYTHROMYCIN ETHYLSUCCINATE 200 MG/5ML PO SUSR: 200.0000 mg | Freq: Three times a day (TID) | ORAL | 1 refills | Status: DC

## 2016-04-02 MED ORDER — SODIUM CHLORIDE 0.9 % IV SOLN: INTRAVENOUS | Status: DC

## 2016-04-02 MED ORDER — SUCRALFATE 1 GM/10ML PO SUSP: 1.0000 g | Freq: Three times a day (TID) | ORAL | 1 refills | Status: DC

## 2016-04-02 MED ORDER — FENTANYL CITRATE (PF) 100 MCG/2ML IJ SOLN
INTRAMUSCULAR | Status: AC
  Filled 2016-04-02: qty 2

## 2016-04-02 MED ORDER — MIDAZOLAM HCL 5 MG/ML IJ SOLN
INTRAMUSCULAR | Status: AC
  Filled 2016-04-02: qty 2

## 2016-04-02 MED ORDER — MIDAZOLAM HCL 10 MG/2ML IJ SOLN
INTRAMUSCULAR | Status: DC | PRN
  Administered 2016-04-02: 2 mg via INTRAVENOUS
  Administered 2016-04-02: 1 mg via INTRAVENOUS

## 2016-04-02 NOTE — H&P (Signed)
[facility] History and Physical   Primary Care Physician:  [name], MD   Reason for Procedure:   Evaluate recurrent vomiting  Plan:    EGD The risks and benefits as well as alternatives of endoscopic procedure(s) have been discussed and reviewed. All questions answered. The patient agrees to proceed.      HPI: [name] is a 82 y.o. female s/p resection of gastric cancer - she has had persistent post-op vomiting. Also w/ dysphagia. Had 2 gastric ulcers on EGD 02/2016 and esophagitis.   Past Medical History:  Diagnosis Date  . Adenocarcinoma of stomach ([place]) 11/11/2015   gastric mass on egd  . Anal fissure   . Anemia    hx 3/17 iron infusion, on aranesp and injected B12 since 11/2015.   [name] Anxiety   . Aortic root dilatation ([place])   . 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 CAD (coronary artery disease)   . Cancer ([place])    hx Kidney cancer / hx of endometrial cancer  . CHF (congestive heart failure) ([place])   . Chronic kidney disease    only has one kidney rt lft rem 03 ca  . Elevated LFTs    hepatic steatosis  . Gastroparesis   . GERD (gastroesophageal reflux disease)   . History of uterine cancer 1980s   treated with hysterectomy, external radiation and radiation seed implants.   [name] HTN (hypertension)   . Hyperlipemia   . Hypothyroidism   . Moderate aortic stenosis   . Neuromuscular disorder ([facility])    neuropathy in right leg  . Neuropathy ([place])    feet/legs  . No blood products 02/27/2016  . Osteomyelitis ([place]) as child  . PAF (paroxysmal atrial fibrillation) ([place])   . Presence of permanent cardiac pacemaker   . Radiation proctitis   . Restless leg syndrome   . Stroke [facility]) 15   partial stroke left legally blind  . Tachycardia-bradycardia syndrome ([place])    s/p PPM by Dr Doreatha Lew (MDT) 07/03/10    Past Surgical History:  Procedure Laterality Date  . CARDIAC CATHETERIZATION  2008  . CHOLECYSTECTOMY  1970s  . COLONOSCOPY N/A 11/11/2015    Procedure: COLONOSCOPY;  Surgeon: [name], MD;  Location: [facility] END[facility]COPY;  Service: Endoscopy;  Laterality: N/A;  . ESOPHAGOGASTRODUODENOSCOPY N/A 11/11/2015   Procedure: ESOPHAGOGASTRODUODENOSCOPY (EGD);  Surgeon: [name], MD;  Location: [facility];  Service: Endoscopy;  Laterality: N/A;  . ESOPHAGOGASTRODUODENOSCOPY N/A 02/05/2016   Procedure: ESOPHAGOGASTRODUODENOSCOPY (EGD);  Surgeon: Ladene Artist, MD;  Location: [facility]COPY;  Service: Endoscopy;  Laterality: N/A;  . GASTRECTOMY N/A 01/15/2016   Procedure: OPEN PARTIAL GASTRECTOMY;  Surgeon: Stark Klein, MD;  Location: [facility];  Service: General;  Laterality: N/A;  . GASTRECTOMY Left 02/24/2016   Procedure: OPEN J TUBE;  Surgeon: Stark Klein, MD;  Location: [facility];  Service: General;  Laterality: Left;  . I&D KNEE WITH POLY EXCHANGE Right 12/17/2013   Procedure: IRRIGATION AND DEBRIDEMEN RIGHT TOTAL  KNEE WITH POLY EXCHANGE;  Surgeon: Mauri Pole, MD;  Location: [facility];  Service: Orthopedics;  Laterality: Right;  . LAPAROSCOPY N/A 01/15/2016   Procedure: LAPAROSCOPY DIAGNOSTIC;  Surgeon: Stark Klein, MD;  Location: [facility];  Service: General;  Laterality: N/A;  . NEPHRECTOMY Left    renal cell cancer  . PACEMAKER INSERTION  07/04/11   MDT PPM implanted by Dr Doreatha Lew for tachy/brady  . PATELLAR TENDON REPAIR Right 03/06/2014   Procedure: AVULSION WITH PATELLA TENDON REPAIR;  Surgeon: Mauri Pole, MD;  Location: [facility];  Service: Orthopedics;  Laterality: Right;  . PATENT DUCTUS ARTERIOUS REPAIR  ~ 1949  . TOTAL ABDOMINAL HYSTERECTOMY  85  . TOTAL HIP REVISION Right 2009   initial replacment surg  . TOTAL KNEE ARTHROPLASTY  02/07/2012   Procedure: TOTAL KNEE ARTHROPLASTY;  Surgeon: Mauri Pole, MD;  Location: [facility];  Service: Orthopedics;  Laterality: Right;  . TOTAL KNEE REVISION Right 07/29/2014   Procedure: RIGHT KNEE REVISION OF PREVIOUS REPAIR EXTENSOR MECHANISM;  Surgeon: Mauri Pole, MD;  Location: [facility];   Service: Orthopedics;  Laterality: Right;  . UPPER GASTROINTESTINAL ENDOSCOPY      Prior to Admission medications   Medication Sig Start Date End Date Taking? Authorizing Provider  acetaminophen (TYLENOL) 325 MG tablet Take 1 tablet (325 mg total) by mouth every 6 (six) hours as needed for mild pain, moderate pain, fever or headache. 02/13/16  Yes Mauricio Gerome Apley, MD  ALPRAZolam Duanne Moron) 0.5 MG tablet TAKE 1/2-1 TABLET BY MOUTH 3 TIMES A DAY AS NEEDED FOR ANXIETY 01/01/16  Yes Courtney Forcucci, PA-C  B Complex-C (B-COMPLEX WITH VITAMIN C) tablet Take 1 tablet by mouth daily. 12/11/15  Yes [name], MD  Biotin 5000 MCG CAPS Take 5,000 mcg by mouth every morning.    Yes Historical Provider, MD  cetirizine (ZYRTEC) 10 MG tablet Take 10 mg by mouth at bedtime.   Yes Historical Provider, MD  Cholecalciferol (VITAMIN D PO) Take 5,000 Units by mouth every morning.    Yes Historical Provider, MD  Cyanocobalamin 1000 MCG/ML KIT Inject 1,000 mcg into the skin as directed. Patient taking differently: Inject 1,000 mcg into the skin every 30 (thirty) days.  11/24/15  Yes Brunetta Genera, MD  diltiazem (CARDIZEM CD) 180 MG 24 hr capsule Take 1 capsule (180 mg total) by mouth daily. 03/30/16  Yes [name], MD  furosemide (LASIX) 40 MG tablet TAKE 1 TABLET (40 MG TOTAL) BY MOUTH DAILY. 02/07/16  Yes [name], MD  levothyroxine (SYNTHROID, LEVOTHROID) 100 MCG tablet TAKE 1/2 TABLET (50 MCG TOTAL) BY MOUTH EVERY DAY ON EMPTY STOMACH WITH A FULL GLASS OF WATER 02/13/16  Yes Mauricio Gerome Apley, MD  LYRICA 75 MG capsule TAKE ONE CAPSULE BY MOUTH TWICE A DAY 11/29/15  Yes [name], MD  metoprolol (LOPRESSOR) 100 MG tablet Take 1 tablet (100 mg total) by mouth 2 (two) times daily. 03/30/16  Yes [name], MD  Multiple Vitamin (MULTIVITAMIN) tablet Take 1 tablet by mouth daily.   Yes Historical Provider, MD  Nutritional Supplements (FEEDING SUPPLEMENT, VITAL 1.5 CAL,) LIQD Place 1,100  mLs into feeding tube daily. 03/16/16  Yes Verlee Monte, MD  ondansetron (ZOFRAN) 4 MG tablet Take 1 tablet (4 mg total) by mouth every 8 (eight) hours as needed for nausea or vomiting. 03/16/16  Yes Verlee Monte, MD  pantoprazole (PROTONIX) 40 MG tablet TAKE 1 TABLET (40 MG TOTAL) BY MOUTH 2 (TWO) TIMES DAILY. 12/11/15  Yes [name], MD  potassium chloride SA (K-DUR,KLOR-CON) 10 MEQ tablet Take 2 tablets (20 mEq total) by mouth daily. 03/16/16  Yes Verlee Monte, MD  scopolamine (TRANSDERM-SCOP, 1.5 MG,) 1 MG/3DAYS Place 1 patch (1.5 mg total) onto the skin every 3 (three) days. 03/23/16  Yes [name], MD  sertraline (ZOLOFT) 50 MG tablet Take 1 tablet (50 mg total) by mouth daily. For mood 09/23/15  Yes [name], MD  traMADol (ULTRAM) 50 MG tablet Take 1 tablet (  50 mg total) by mouth every 6 (six) hours as needed (pain). Reported on 10/14/2015 01/26/16  Yes Stark Klein, MD  warfarin (COUMADIN) 2 MG tablet TAKE 1 TABLET DAILY OR AS DIRECTED Patient taking differently: Take 2 mg by mouth nightly (Sun/Mon/Tues/Wed/Thurs/Fri/Sat) 01/26/16  Yes [name], MD  nitroGLYCERIN (NITROSTAT) 0.4 MG SL tablet Place 1 tablet (0.4 mg total) under the tongue every 5 (five) minutes as needed for chest pain (MAX 3 TABLETS). 05/19/15   Darlin Coco, MD    Current Facility-Administered Medications  Medication Dose Route Frequency Provider Last Rate Last Dose  . 0.9 %  sodium chloride infusion   Intravenous Continuous [name], MD        Allergies as of 03/30/2016 - Review Complete 03/29/2016  Allergen Reaction Noted  . Amiodarone Other (See Comments) 10/09/2015  . Codeine Nausea And Vomiting   . Gabapentin Itching 10/28/2014  . Hydrocodone Other (See Comments) 03/07/2014  . Morphine and related Nausea And Vomiting 12/13/2013  . Requip [ropinirole hcl]  07/17/2013  . Zinc Nausea Only 04/22/2011  . Reglan [metoclopramide] Hives, Itching, and Rash 02/17/2016    Family History   Problem Relation Age of Onset  . Colon cancer Mother 42  . Heart disease Mother   . Heart disease Father   . Heart attack Father   . Heart disease Maternal Grandmother     Social History   Social History  . Marital status: Widowed    Spouse name: N/A  . Number of children: N/A  . Years of education: N/A   Occupational History  . Not on file.   Social History Main Topics  . Smoking status: Never Smoker  . Smokeless tobacco: Never Used  . Alcohol use No  . Drug use: No  . Sexual activity: No   Other Topics Concern  . Not on file   Social History Narrative   Lives in [place].  Continues to work.    Review of Systems: Positive for weight loss, fatigue All other review of systems negative except as mentioned in the HPI.  Physical Exam: Vital signs in last 24 hours: Temp:  [98.2 F (36.8 C)] 98.2 F (36.8 C) (07/28 0658) Pulse Rate:  [72] 72 (07/28 0658) Resp:  [10] 10 (07/28 0658) BP: (211)/(63) 211/63 (07/28 0658) SpO2:  [100 %] 100 % (07/28 0658) Weight:  [143 lb (64.9 kg)] 143 lb (64.9 kg) (07/28 0651)   General:   Alert,  Well-developed, well-nourished, pleasant and cooperative in NAD Lungs:  Clear throughout to auscultation.   Heart:  Regular rate and rhythm; no murmurs, clicks, rubs,  or gallops. Abdomen:  Soft, nontender and nondistended. Normal bowel sounds.   Neuro/Psych:  Alert and cooperative. Normal mood and affect. A and O x 3   _0  E. Carlean Purl, MD, [facility] [phone] (pager) 04/02/2016 7:36 AM@

## 2016-04-02 NOTE — Telephone Encounter (Signed)
Ok to change [facility]

## 2016-04-02 NOTE — Op Note (Signed)
[facility] Patient Name: [name] Procedure Date: 04/02/2016 MRN: [id number] Attending MD: [name] , MD Date of Birth: 04-16-1934 CSN: [id number] Age: 82 Admit Type: Outpatient Procedure:                Upper GI endoscopy Indications:              Persistent vomiting Providers:                [name], MD, Hilma Favors, RN, Alfonso Patten,                            Technician Referring MD:              Medicines:                Midazolam 3 mg IV, Fentanyl 50 micrograms IV Complications:            No immediate complications. Estimated Blood Loss:     Estimated blood loss was minimal. Procedure:                Pre-Anesthesia Assessment:                           - Prior to the procedure, a History and Physical                            was performed, and patient medications and                            allergies were reviewed. The patient's tolerance of                            previous anesthesia was also reviewed. The risks                            and benefits of the procedure and the sedation                            options and risks were discussed with the patient.                            All questions were answered, and informed consent                            was obtained. Prior Anticoagulants: The patient                            last took Coumadin (warfarin) 1 day prior to the                            procedure. ASA Grade Assessment: III - A patient                            with severe systemic disease. After reviewing the  risks and benefits, the patient was deemed in                            satisfactory condition to undergo the procedure.                           After obtaining informed consent, the endoscope was                            passed under direct vision. Throughout the                            procedure, the patient's blood pressure, pulse, and   oxygen saturations were monitored continuously. The                            [id number]) scope was introduced through the                            mouth, and advanced to the second part of duodenum.                            The upper GI endoscopy was accomplished without                            difficulty. The patient tolerated the procedure                            well. Scope In: Scope Out: Findings:      LA Grade D (one or more mucosal breaks involving at least 75% of       esophageal circumference) esophagitis with bleeding was found in the       entire esophagus. Biopsies were taken with a cold forceps for histology.       Verification of patient identification for the specimen was done.       Estimated blood loss was minimal.      Evidence of a wedge gastric resection were found in the gastric fundus.       This was characterized by edema and friable mucosa. Biopsies were taken       with a cold forceps for histology. Verification of patient       identification for the specimen was done. Estimated blood loss was       minimal.      The exam was otherwise without abnormality.      retroflexion shows post-operative changes Impression:               - LA Grade D reflux esophagitis. Biopsied.                           - A wedge gastric resection were found,                            characterized by friable mucosa and edema. Biopsied.                           -  The examination was otherwise normal. Moderate Sedation:      Moderate (conscious) sedation was administered by the endoscopy nurse       and supervised by the endoscopist. The following parameters were       monitored: oxygen saturation, heart rate, blood pressure, respiratory       rate, EKG, adequacy of pulmonary ventilation, and response to care.       Total physician intraservice time was 15 minutes. Recommendation:           - Patient has a contact number available for                             emergencies. The signs and symptoms of potential                            delayed complications were discussed with the                            patient. Return to normal activities tomorrow.                            Written discharge instructions were provided to the                            patient.                           - Resume previous diet.                           - Continue present medications.                           - Follow an antireflux regimen.                           - Use sucralfate suspension 1 gram PO QID.                           - Consider domperidone                           - use J tube                           - Use erythromycin 200 mg PO q 6 hr for 10 days. Procedure Code(s):        --- Professional ---                           [phone], Esophagogastroduodenoscopy, flexible,                            transoral; with biopsy, single or multiple                           G0500, Moderate sedation services provided by the  same physician or other qualified health care                            professional performing a gastrointestinal                            endoscopic service that sedation supports,                            requiring the presence of an independent trained                            observer to assist in the monitoring of the                            patient's level of consciousness and physiological                            status; initial 15 minutes of intra-service time;                            patient age 57 years or older (additional time may                            be reported with [phone], as appropriate) Diagnosis Code(s):        --- Professional ---                           K21.0, Gastro-esophageal reflux disease with                            esophagitis                           R11.10, Vomiting, unspecified CPT copyright 2016 American Medical Association. All rights reserved. The  codes documented in this report are preliminary and upon coder review may  be revised to meet current compliance requirements. [name], MD 04/02/2016 8:23:07 AM This report has been signed electronically. Number of Addenda: 0

## 2016-04-02 NOTE — Telephone Encounter (Signed)
[facility] and [facility] need to know if [facility] can use 452ml/5ml erythromycin in place of the 200mg /7ml. Please advise.  Thank you.

## 2016-04-02 NOTE — Discharge Instructions (Signed)
Partial Antrectomy or Subtotal Gastrectomy A partial antrectomy or subtotal gastrectomy is the surgical removal of part of the stomach. It is performed to treat cancer of the stomach, ulcer disease, obstruction, or injury (trauma). LET YOUR CAREGIVER KNOW ABOUT:   Allergies to food or medicine.  Medicines taken, including vitamins, herbs, eyedrops, over-the-counter medicines, and creams.  Use of steroids (by mouth or creams).  Previous problems with anesthetics or numbing medicines.  History of bleeding problems or blood clots.  Previous surgery.  Other health problems, including diabetes and kidney problems.  Possibility of pregnancy, if this applies. RISKS AND COMPLICATIONS  You will be monitored closely for complications during surgery and recovery. Many complications can be treated. Complications may include:  Bleeding.  Infection.  Reaction to anesthesia.  Damage to other organs or tissue.  Hernia.  Blood clot. BEFORE THE PROCEDURE  Before your procedure, you may have:  A physical exam, blood tests, stool test, X-rays, and other procedures.  Chemotherapy or radiation therapy.  Your caregiver review with you the procedure, the anesthesia being used, and what to expect after the procedure. You may be asked to:  Stop taking certain medicines for several days prior to your procedure, such as blood thinners (including aspirin).  Take certain medicines, such as antibiotics or stool softeners.  Follow a special diet for several days prior to the procedure.  Avoid eating and drinking after midnight the night before the procedure. This will help you to avoid complications from the anesthesia.  Take an antibacterial shower the night before or the morning of the procedure. Arrange for a ride home after surgery and to ask someone to help you with activities during recovery. PROCEDURE   You will be given a medicine to make you sleep (general anesthesia) during the  procedure.  The procedure takes a few to several hours to complete. You will not feel any pain.  Partial antrectomy or subtotal gastrectomy can be performed as an open procedure or a laparoscopic procedure.  During an open procedure, the surgeon will make a cut (incision) in the abdominal wall to access the stomach.  During a laparoscopic procedure, small incisions are made in the abdominal wall. Small, lighted tubes (laparoscopes) with instruments are inserted into the surgical site.  Sometimes, a combination procedure is performed using both of these techniques.  The surgeon will remove a part of the stomach and connect the remaining stomach to the small intestine. Depending on your condition, other parts (spleen, pancreas, lymph nodes) may be removed during surgery as well. AFTER THE PROCEDURE   You will be monitored closely in a recovery room.  You will need to stay in the hospital for up to a week or longer, depending on your condition.  You will be given medicine for pain and nutrition through an intravenous (IV) access.  If you have a tube in the nose (nasogastric, NG tube) to remove fluids, it will be removed after 2 to 3 days. MAKE SURE YOU:   Understand these instructions.  Will watch your condition.  Will get help right away if you are not doing well or get worse.   This information is not intended to replace advice given to you by your health care provider. Make sure you discuss any questions you have with your health care provider.   Document Released: 11/17/2009 Document Revised: 09/13/2014 Document Reviewed: 11/17/2009 Elsevier Interactive Patient Education [employer].

## 2016-04-06 ENCOUNTER — Ambulatory Visit (INDEPENDENT_AMBULATORY_CARE_PROVIDER_SITE_OTHER): Payer: Medicare Other | Admitting: Interventional Cardiology

## 2016-04-06 DIAGNOSIS — Z79891 Long term (current) use of opiate analgesic: Secondary | ICD-10-CM | POA: Diagnosis not present

## 2016-04-06 DIAGNOSIS — I2510 Atherosclerotic heart disease of native coronary artery without angina pectoris: Secondary | ICD-10-CM | POA: Diagnosis not present

## 2016-04-06 DIAGNOSIS — D649 Anemia, unspecified: Secondary | ICD-10-CM | POA: Diagnosis not present

## 2016-04-06 DIAGNOSIS — K259 Gastric ulcer, unspecified as acute or chronic, without hemorrhage or perforation: Secondary | ICD-10-CM | POA: Diagnosis not present

## 2016-04-06 DIAGNOSIS — K210 Gastro-esophageal reflux disease with esophagitis: Secondary | ICD-10-CM | POA: Diagnosis not present

## 2016-04-06 DIAGNOSIS — Z7901 Long term (current) use of anticoagulants: Secondary | ICD-10-CM

## 2016-04-06 DIAGNOSIS — C649 Malignant neoplasm of unspecified kidney, except renal pelvis: Secondary | ICD-10-CM | POA: Diagnosis not present

## 2016-04-06 DIAGNOSIS — I480 Paroxysmal atrial fibrillation: Secondary | ICD-10-CM | POA: Diagnosis not present

## 2016-04-06 DIAGNOSIS — Z483 Aftercare following surgery for neoplasm: Secondary | ICD-10-CM | POA: Diagnosis not present

## 2016-04-06 DIAGNOSIS — C169 Malignant neoplasm of stomach, unspecified: Secondary | ICD-10-CM | POA: Diagnosis not present

## 2016-04-06 DIAGNOSIS — I130 Hypertensive heart and chronic kidney disease with heart failure and stage 1 through stage 4 chronic kidney disease, or unspecified chronic kidney disease: Secondary | ICD-10-CM | POA: Diagnosis not present

## 2016-04-06 DIAGNOSIS — Z934 Other artificial openings of gastrointestinal tract status: Secondary | ICD-10-CM | POA: Diagnosis not present

## 2016-04-06 LAB — POCT INR: INR: 3.8

## 2016-04-06 NOTE — Progress Notes (Signed)
No cancer in biopsies Please get a sx update Have cced [name]

## 2016-04-07 DIAGNOSIS — Z483 Aftercare following surgery for neoplasm: Secondary | ICD-10-CM | POA: Diagnosis not present

## 2016-04-07 DIAGNOSIS — C169 Malignant neoplasm of stomach, unspecified: Secondary | ICD-10-CM | POA: Diagnosis not present

## 2016-04-07 DIAGNOSIS — D649 Anemia, unspecified: Secondary | ICD-10-CM | POA: Diagnosis not present

## 2016-04-07 DIAGNOSIS — K210 Gastro-esophageal reflux disease with esophagitis: Secondary | ICD-10-CM | POA: Diagnosis not present

## 2016-04-07 DIAGNOSIS — K259 Gastric ulcer, unspecified as acute or chronic, without hemorrhage or perforation: Secondary | ICD-10-CM | POA: Diagnosis not present

## 2016-04-07 DIAGNOSIS — Z7901 Long term (current) use of anticoagulants: Secondary | ICD-10-CM | POA: Diagnosis not present

## 2016-04-07 DIAGNOSIS — I130 Hypertensive heart and chronic kidney disease with heart failure and stage 1 through stage 4 chronic kidney disease, or unspecified chronic kidney disease: Secondary | ICD-10-CM | POA: Diagnosis not present

## 2016-04-07 DIAGNOSIS — C649 Malignant neoplasm of unspecified kidney, except renal pelvis: Secondary | ICD-10-CM | POA: Diagnosis not present

## 2016-04-07 DIAGNOSIS — I2510 Atherosclerotic heart disease of native coronary artery without angina pectoris: Secondary | ICD-10-CM | POA: Diagnosis not present

## 2016-04-07 DIAGNOSIS — I480 Paroxysmal atrial fibrillation: Secondary | ICD-10-CM | POA: Diagnosis not present

## 2016-04-07 DIAGNOSIS — Z79891 Long term (current) use of opiate analgesic: Secondary | ICD-10-CM | POA: Diagnosis not present

## 2016-04-07 DIAGNOSIS — Z934 Other artificial openings of gastrointestinal tract status: Secondary | ICD-10-CM | POA: Diagnosis not present

## 2016-04-08 DIAGNOSIS — Z79891 Long term (current) use of opiate analgesic: Secondary | ICD-10-CM | POA: Diagnosis not present

## 2016-04-08 DIAGNOSIS — C169 Malignant neoplasm of stomach, unspecified: Secondary | ICD-10-CM | POA: Diagnosis not present

## 2016-04-08 DIAGNOSIS — C55 Malignant neoplasm of uterus, part unspecified: Secondary | ICD-10-CM | POA: Diagnosis not present

## 2016-04-08 DIAGNOSIS — Z483 Aftercare following surgery for neoplasm: Secondary | ICD-10-CM | POA: Diagnosis not present

## 2016-04-08 DIAGNOSIS — D649 Anemia, unspecified: Secondary | ICD-10-CM | POA: Diagnosis not present

## 2016-04-08 DIAGNOSIS — I480 Paroxysmal atrial fibrillation: Secondary | ICD-10-CM | POA: Diagnosis not present

## 2016-04-08 DIAGNOSIS — C649 Malignant neoplasm of unspecified kidney, except renal pelvis: Secondary | ICD-10-CM | POA: Diagnosis not present

## 2016-04-08 DIAGNOSIS — I130 Hypertensive heart and chronic kidney disease with heart failure and stage 1 through stage 4 chronic kidney disease, or unspecified chronic kidney disease: Secondary | ICD-10-CM | POA: Diagnosis not present

## 2016-04-08 DIAGNOSIS — K259 Gastric ulcer, unspecified as acute or chronic, without hemorrhage or perforation: Secondary | ICD-10-CM | POA: Diagnosis not present

## 2016-04-08 DIAGNOSIS — Z7901 Long term (current) use of anticoagulants: Secondary | ICD-10-CM | POA: Diagnosis not present

## 2016-04-08 DIAGNOSIS — Z934 Other artificial openings of gastrointestinal tract status: Secondary | ICD-10-CM | POA: Diagnosis not present

## 2016-04-08 DIAGNOSIS — I2510 Atherosclerotic heart disease of native coronary artery without angina pectoris: Secondary | ICD-10-CM | POA: Diagnosis not present

## 2016-04-08 DIAGNOSIS — K210 Gastro-esophageal reflux disease with esophagitis: Secondary | ICD-10-CM | POA: Diagnosis not present

## 2016-04-09 ENCOUNTER — Other Ambulatory Visit: Payer: Self-pay | Admitting: General Surgery

## 2016-04-09 ENCOUNTER — Ambulatory Visit (INDEPENDENT_AMBULATORY_CARE_PROVIDER_SITE_OTHER): Payer: Medicare Other | Admitting: Pharmacist

## 2016-04-09 DIAGNOSIS — I2510 Atherosclerotic heart disease of native coronary artery without angina pectoris: Secondary | ICD-10-CM | POA: Diagnosis not present

## 2016-04-09 DIAGNOSIS — Z934 Other artificial openings of gastrointestinal tract status: Secondary | ICD-10-CM | POA: Diagnosis not present

## 2016-04-09 DIAGNOSIS — Z7901 Long term (current) use of anticoagulants: Secondary | ICD-10-CM

## 2016-04-09 DIAGNOSIS — D649 Anemia, unspecified: Secondary | ICD-10-CM | POA: Diagnosis not present

## 2016-04-09 DIAGNOSIS — K259 Gastric ulcer, unspecified as acute or chronic, without hemorrhage or perforation: Secondary | ICD-10-CM | POA: Diagnosis not present

## 2016-04-09 DIAGNOSIS — Z483 Aftercare following surgery for neoplasm: Secondary | ICD-10-CM | POA: Diagnosis not present

## 2016-04-09 DIAGNOSIS — C55 Malignant neoplasm of uterus, part unspecified: Secondary | ICD-10-CM | POA: Diagnosis not present

## 2016-04-09 DIAGNOSIS — I130 Hypertensive heart and chronic kidney disease with heart failure and stage 1 through stage 4 chronic kidney disease, or unspecified chronic kidney disease: Secondary | ICD-10-CM | POA: Diagnosis not present

## 2016-04-09 DIAGNOSIS — K210 Gastro-esophageal reflux disease with esophagitis: Secondary | ICD-10-CM | POA: Diagnosis not present

## 2016-04-09 DIAGNOSIS — Z79891 Long term (current) use of opiate analgesic: Secondary | ICD-10-CM | POA: Diagnosis not present

## 2016-04-09 DIAGNOSIS — C649 Malignant neoplasm of unspecified kidney, except renal pelvis: Secondary | ICD-10-CM | POA: Diagnosis not present

## 2016-04-09 DIAGNOSIS — I480 Paroxysmal atrial fibrillation: Secondary | ICD-10-CM

## 2016-04-09 DIAGNOSIS — C169 Malignant neoplasm of stomach, unspecified: Secondary | ICD-10-CM | POA: Diagnosis not present

## 2016-04-09 LAB — POCT INR: INR: 4.2

## 2016-04-12 ENCOUNTER — Other Ambulatory Visit: Payer: Self-pay | Admitting: Internal Medicine

## 2016-04-12 MED ORDER — GABAPENTIN 300 MG/6ML PO SOLN: 100.0000 mg | Freq: Three times a day (TID) | ORAL | 0 refills | Status: DC

## 2016-04-13 ENCOUNTER — Ambulatory Visit (INDEPENDENT_AMBULATORY_CARE_PROVIDER_SITE_OTHER): Payer: Medicare Other | Admitting: Interventional Cardiology

## 2016-04-13 ENCOUNTER — Other Ambulatory Visit: Payer: Self-pay | Admitting: *Deleted

## 2016-04-13 DIAGNOSIS — Z483 Aftercare following surgery for neoplasm: Secondary | ICD-10-CM | POA: Diagnosis not present

## 2016-04-13 DIAGNOSIS — I480 Paroxysmal atrial fibrillation: Secondary | ICD-10-CM | POA: Diagnosis not present

## 2016-04-13 DIAGNOSIS — Z79891 Long term (current) use of opiate analgesic: Secondary | ICD-10-CM | POA: Diagnosis not present

## 2016-04-13 DIAGNOSIS — K259 Gastric ulcer, unspecified as acute or chronic, without hemorrhage or perforation: Secondary | ICD-10-CM | POA: Diagnosis not present

## 2016-04-13 DIAGNOSIS — K210 Gastro-esophageal reflux disease with esophagitis: Secondary | ICD-10-CM | POA: Diagnosis not present

## 2016-04-13 DIAGNOSIS — D649 Anemia, unspecified: Secondary | ICD-10-CM | POA: Diagnosis not present

## 2016-04-13 DIAGNOSIS — Z7901 Long term (current) use of anticoagulants: Secondary | ICD-10-CM

## 2016-04-13 DIAGNOSIS — I130 Hypertensive heart and chronic kidney disease with heart failure and stage 1 through stage 4 chronic kidney disease, or unspecified chronic kidney disease: Secondary | ICD-10-CM | POA: Diagnosis not present

## 2016-04-13 DIAGNOSIS — I2510 Atherosclerotic heart disease of native coronary artery without angina pectoris: Secondary | ICD-10-CM | POA: Diagnosis not present

## 2016-04-13 DIAGNOSIS — C55 Malignant neoplasm of uterus, part unspecified: Secondary | ICD-10-CM | POA: Diagnosis not present

## 2016-04-13 DIAGNOSIS — Z934 Other artificial openings of gastrointestinal tract status: Secondary | ICD-10-CM | POA: Diagnosis not present

## 2016-04-13 DIAGNOSIS — C649 Malignant neoplasm of unspecified kidney, except renal pelvis: Secondary | ICD-10-CM | POA: Diagnosis not present

## 2016-04-13 DIAGNOSIS — C169 Malignant neoplasm of stomach, unspecified: Secondary | ICD-10-CM | POA: Diagnosis not present

## 2016-04-13 LAB — POCT INR: INR: 3.2

## 2016-04-13 MED ORDER — GABAPENTIN 250 MG/5ML PO SOLN: ORAL | 5 refills | Status: DC

## 2016-04-15 DIAGNOSIS — C169 Malignant neoplasm of stomach, unspecified: Secondary | ICD-10-CM | POA: Diagnosis not present

## 2016-04-16 ENCOUNTER — Ambulatory Visit: Payer: Self-pay | Admitting: Internal Medicine

## 2016-04-16 DIAGNOSIS — C169 Malignant neoplasm of stomach, unspecified: Secondary | ICD-10-CM | POA: Diagnosis not present

## 2016-04-19 ENCOUNTER — Ambulatory Visit (INDEPENDENT_AMBULATORY_CARE_PROVIDER_SITE_OTHER): Payer: Medicare Other | Admitting: Pharmacist

## 2016-04-19 DIAGNOSIS — I2510 Atherosclerotic heart disease of native coronary artery without angina pectoris: Secondary | ICD-10-CM | POA: Diagnosis not present

## 2016-04-19 DIAGNOSIS — Z79891 Long term (current) use of opiate analgesic: Secondary | ICD-10-CM | POA: Diagnosis not present

## 2016-04-19 DIAGNOSIS — Z7901 Long term (current) use of anticoagulants: Secondary | ICD-10-CM | POA: Diagnosis not present

## 2016-04-19 DIAGNOSIS — I130 Hypertensive heart and chronic kidney disease with heart failure and stage 1 through stage 4 chronic kidney disease, or unspecified chronic kidney disease: Secondary | ICD-10-CM | POA: Diagnosis not present

## 2016-04-19 DIAGNOSIS — Z483 Aftercare following surgery for neoplasm: Secondary | ICD-10-CM | POA: Diagnosis not present

## 2016-04-19 DIAGNOSIS — D649 Anemia, unspecified: Secondary | ICD-10-CM | POA: Diagnosis not present

## 2016-04-19 DIAGNOSIS — I480 Paroxysmal atrial fibrillation: Secondary | ICD-10-CM | POA: Diagnosis not present

## 2016-04-19 DIAGNOSIS — Z934 Other artificial openings of gastrointestinal tract status: Secondary | ICD-10-CM | POA: Diagnosis not present

## 2016-04-19 DIAGNOSIS — C649 Malignant neoplasm of unspecified kidney, except renal pelvis: Secondary | ICD-10-CM | POA: Diagnosis not present

## 2016-04-19 DIAGNOSIS — K210 Gastro-esophageal reflux disease with esophagitis: Secondary | ICD-10-CM | POA: Diagnosis not present

## 2016-04-19 DIAGNOSIS — C55 Malignant neoplasm of uterus, part unspecified: Secondary | ICD-10-CM | POA: Diagnosis not present

## 2016-04-19 DIAGNOSIS — C169 Malignant neoplasm of stomach, unspecified: Secondary | ICD-10-CM | POA: Diagnosis not present

## 2016-04-19 DIAGNOSIS — K259 Gastric ulcer, unspecified as acute or chronic, without hemorrhage or perforation: Secondary | ICD-10-CM | POA: Diagnosis not present

## 2016-04-19 LAB — POCT INR: INR: 2.5

## 2016-04-20 DIAGNOSIS — K259 Gastric ulcer, unspecified as acute or chronic, without hemorrhage or perforation: Secondary | ICD-10-CM | POA: Diagnosis not present

## 2016-04-20 DIAGNOSIS — K210 Gastro-esophageal reflux disease with esophagitis: Secondary | ICD-10-CM | POA: Diagnosis not present

## 2016-04-20 DIAGNOSIS — D649 Anemia, unspecified: Secondary | ICD-10-CM | POA: Diagnosis not present

## 2016-04-20 DIAGNOSIS — Z483 Aftercare following surgery for neoplasm: Secondary | ICD-10-CM | POA: Diagnosis not present

## 2016-04-20 DIAGNOSIS — Z934 Other artificial openings of gastrointestinal tract status: Secondary | ICD-10-CM | POA: Diagnosis not present

## 2016-04-20 DIAGNOSIS — Z79891 Long term (current) use of opiate analgesic: Secondary | ICD-10-CM | POA: Diagnosis not present

## 2016-04-20 DIAGNOSIS — I130 Hypertensive heart and chronic kidney disease with heart failure and stage 1 through stage 4 chronic kidney disease, or unspecified chronic kidney disease: Secondary | ICD-10-CM | POA: Diagnosis not present

## 2016-04-20 DIAGNOSIS — C55 Malignant neoplasm of uterus, part unspecified: Secondary | ICD-10-CM | POA: Diagnosis not present

## 2016-04-20 DIAGNOSIS — C169 Malignant neoplasm of stomach, unspecified: Secondary | ICD-10-CM | POA: Diagnosis not present

## 2016-04-20 DIAGNOSIS — Z7901 Long term (current) use of anticoagulants: Secondary | ICD-10-CM | POA: Diagnosis not present

## 2016-04-20 DIAGNOSIS — C649 Malignant neoplasm of unspecified kidney, except renal pelvis: Secondary | ICD-10-CM | POA: Diagnosis not present

## 2016-04-20 DIAGNOSIS — I480 Paroxysmal atrial fibrillation: Secondary | ICD-10-CM | POA: Diagnosis not present

## 2016-04-20 DIAGNOSIS — I2510 Atherosclerotic heart disease of native coronary artery without angina pectoris: Secondary | ICD-10-CM | POA: Diagnosis not present

## 2016-04-21 ENCOUNTER — Encounter: Payer: Self-pay | Admitting: Internal Medicine

## 2016-04-21 ENCOUNTER — Ambulatory Visit (INDEPENDENT_AMBULATORY_CARE_PROVIDER_SITE_OTHER): Payer: Medicare Other | Admitting: Internal Medicine

## 2016-04-21 VITALS — BP 142/80 | HR 94 | Temp 97.3°F | Resp 14 | Ht 61.0 in | Wt 142.4 lb

## 2016-04-21 DIAGNOSIS — E559 Vitamin D deficiency, unspecified: Secondary | ICD-10-CM | POA: Diagnosis not present

## 2016-04-21 DIAGNOSIS — E538 Deficiency of other specified B group vitamins: Secondary | ICD-10-CM

## 2016-04-21 DIAGNOSIS — R7303 Prediabetes: Secondary | ICD-10-CM

## 2016-04-21 DIAGNOSIS — I10 Essential (primary) hypertension: Secondary | ICD-10-CM

## 2016-04-21 DIAGNOSIS — Z79899 Other long term (current) drug therapy: Secondary | ICD-10-CM

## 2016-04-21 DIAGNOSIS — E038 Other specified hypothyroidism: Secondary | ICD-10-CM

## 2016-04-21 LAB — BASIC METABOLIC PANEL WITH GFR
BUN: 20 mg/dL (ref 7–25)
CALCIUM: 9.1 mg/dL (ref 8.6–10.4)
CHLORIDE: 99 mmol/L (ref 98–110)
CO2: 24 mmol/L (ref 20–31)
Creat: 0.71 mg/dL (ref 0.60–0.88)
GFR, EST NON AFRICAN AMERICAN: 81 mL/min (ref >=60)
GFR, Est African American: >89 mL/min (ref >=60)
Glucose, Bld: 109 mg/dL — ABNORMAL HIGH (ref 65–99)
Potassium: 3.7 mmol/L (ref 3.5–5.3)
SODIUM: 137 mmol/L (ref 135–146)

## 2016-04-21 LAB — HEPATIC FUNCTION PANEL
ALT: 8 U/L (ref 6–29)
AST: 15 U/L (ref 10–35)
Albumin: 3.7 g/dL (ref 3.6–5.1)
Alkaline Phosphatase: 95 U/L (ref 33–130)
BILIRUBIN DIRECT: 0.1 mg/dL (ref <=0.2)
BILIRUBIN INDIRECT: 0.3 mg/dL (ref 0.2–1.2)
BILIRUBIN TOTAL: 0.4 mg/dL (ref 0.2–1.2)
Total Protein: 6.5 g/dL (ref 6.1–8.1)

## 2016-04-21 LAB — INSULIN, RANDOM: Insulin: 18.9 u[IU]/mL (ref 2.0–19.6)

## 2016-04-21 LAB — MAGNESIUM: Magnesium: 2 mg/dL (ref 1.5–2.5)

## 2016-04-21 LAB — TSH: TSH: 1.53 m[IU]/L

## 2016-04-21 NOTE — Patient Instructions (Signed)

## 2016-04-21 NOTE — Progress Notes (Signed)
[facility]                       [name], M.D.        Uvaldo Bristle. Silverio Lay, P.A.-C       Starlyn Skeans, P.A.-C   [facility]                [address] [place] [address] Telephone [phone] Telefax [phone] ______________________________________________________________________     This very nice 82y.o.WWF presents for follow up from recent hospitalization 6/13-7/11 with refractory N/V s/p recent partial gastrectomy in May for Gastric Adenoca.  EGD found Esophagitis & gastric ulcers. Jejunostomy feeding tube was placed and patient was sent home. Patient is still poorly tolerating J-tube feedings & is scheduled for Roux-en-Y procedure on 9.12.2017.       Other problems include anticoagulation for Afib/PPM and AoS, , Hypertension, Hyperlipidemia, Pre-Diabetes, Hypothyroidism and Vitamin D Deficiency. Also pertinent hx is that of hx/o Uterine & Renal Cancer. Her daughters are providing her home care and frustrations with J-tube feedings and difficulties with her oral medications as use of the feeding tube was disallowed for med administration.      Patient is treated for HTN & BP has been controlled at home. Coumadin is monitored at the [facility] clinics.  Today's BP is 142/80. Patient has had no complaints of any cardiac type chest pain, palpitations, dyspnea/orthopnea/PND, dizziness, claudication, or dependent edema.     Hyperlipidemia is near controlled with diet. Patient denies myalgias or other med SE's. Last Lipids were near goal: Lab Results  Component Value Date   CHOL 183 10/09/2015   HDL 29 (L) 10/09/2015   LDLCALC 105 10/09/2015   TRIG 93 03/01/2016   CHOLHDL 6.3 (H) 10/09/2015      Also, the patient has history of PreDiabetes and has had no symptoms of reactive hypoglycemia, diabetic polys, paresthesias or visual blurring.  Last A1c was near goal: Lab Results  Component  Value Date   HGBA1C 5.7 (H) 10/09/2015      Further, the patient also has history of Vitamin D Deficiency of "19" in 2008 and supplements vitamin D without any suspected side-effects. Last vitamin D was   Lab Results  Component Value Date   VD25OH 61 10/09/2015   Current Outpatient Prescriptions on File Prior to Visit  Medication Sig  . acetaminophen (TYLENOL) 325 MG tablet Take 1 tablet (325 mg total) by mouth every 6 (six) hours as needed for mild pain, moderate pain, fever or headache.  . ALPRAZolam (XANAX) 0.5 MG tablet TAKE 1/2-1 TABLET BY MOUTH 3 TIMES A DAY AS NEEDED FOR ANXIETY  . Cholecalciferol (VITAMIN D PO) Take 5,000 Units by mouth every morning.   . Cyanocobalamin 1000 MCG/ML KIT Inject 1,000 mcg into the skin as directed. (Patient taking differently: Inject 1,000 mcg into the skin every 30 (thirty) days. )  . diltiazem (CARDIZEM CD) 180 MG 24 hr capsule Take 1 capsule (180 mg total) by mouth daily.  . furosemide (LASIX) 40 MG tablet TAKE 1 TABLET (40 MG TOTAL) BY MOUTH DAILY.  [name] gabapentin (NEURONTIN) 250 MG/5ML solution Take 5 ml (250 mg) 3 x/day after measls  . levothyroxine (SYNTHROID, LEVOTHROID) 100 MCG tablet TAKE 1/2 TABLET (50 MCG TOTAL) BY MOUTH EVERY DAY ON EMPTY STOMACH WITH A FULL GLASS OF WATER  . metoprolol (LOPRESSOR)  100 MG tablet Take 1 tablet (100 mg total) by mouth 2 (two) times daily.  . nitroGLYCERIN (NITROSTAT) 0.4 MG SL tablet Place 1 tablet (0.4 mg total) under the tongue every 5 (five) minutes as needed for chest pain (MAX 3 TABLETS).  . Nutritional Supplements (FEEDING SUPPLEMENT, VITAL 1.5 CAL,) LIQD Place 1,100 mLs into feeding tube daily.  . ondansetron (ZOFRAN) 4 MG tablet Take 1 tablet (4 mg total) by mouth every 8 (eight) hours as needed for nausea or vomiting.  . pantoprazole (PROTONIX) 40 MG tablet TAKE 1 TABLET (40 MG TOTAL) BY MOUTH 2 (TWO) TIMES DAILY.  [name] potassium chloride SA (K-DUR,KLOR-CON) 10 MEQ tablet Take 2 tablets (20 mEq total) by  mouth daily.  [name] scopolamine (TRANSDERM-SCOP, 1.5 MG,) 1 MG/3DAYS Place 1 patch (1.5 mg total) onto the skin every 3 (three) days.  . sucralfate (CARAFATE) 1 GM/10ML suspension Take 10 mLs (1 g total) by mouth 4 (four) times daily -  with meals and at bedtime. Ok to substitute 1 g tabs  . traMADol (ULTRAM) 50 MG tablet Take 1 tablet (50 mg total) by mouth every 6 (six) hours as needed (pain). Reported on 10/14/2015  . warfarin (COUMADIN) 2 MG tablet TAKE 1 TABLET DAILY OR AS DIRECTED (Patient taking differently: Take 2 mg by mouth nightly (Sun/Mon/Tues/Wed/Thurs/Fri/Sat))   No current facility-administered medications on file prior to visit.    Allergies  Allergen Reactions  . Amiodarone Other (See Comments)     Thyroid and liver and kidney problems  . Codeine Nausea And Vomiting  . Gabapentin Itching  . Hydrocodone Other (See Comments)    hallucination  . Morphine And Related Nausea And Vomiting  . Requip [Ropinirole Hcl]     Headache   . Zinc Nausea Only    nausea  . Reglan [Metoclopramide] Hives, Itching and Rash    Made face red, also   PMHx:   Past Medical History:  Diagnosis Date  . Adenocarcinoma of stomach ([place]) 11/11/2015   gastric mass on egd  . Anal fissure   . Anemia    hx 3/17 iron infusion, on aranesp and injected B12 since 11/2015.   [name] Anxiety   . Aortic root dilatation ([place])   . 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 CAD (coronary artery disease)   . Cancer ([place])    hx Kidney cancer / hx of endometrial cancer  . CHF (congestive heart failure) ([place])   . Chronic kidney disease    only has one kidney rt lft rem 03 ca  . Elevated LFTs    hepatic steatosis  . Gastroparesis   . GERD (gastroesophageal reflux disease)   . History of uterine cancer 1980s   treated with hysterectomy, external radiation and radiation seed implants.   [name] HTN (hypertension)   . Hyperlipemia   . Hypothyroidism   . Moderate aortic stenosis   . Neuromuscular disorder ([facility])    neuropathy in right leg   . Neuropathy ([place])    feet/legs  . No blood products 02/27/2016  . Osteomyelitis ([place]) as child  . PAF (paroxysmal atrial fibrillation) ([place])   . Presence of permanent cardiac pacemaker   . Radiation proctitis   . Restless leg syndrome   . Stroke [facility]) 15   partial stroke left legally blind  . Tachycardia-bradycardia syndrome ([place])    s/p PPM by Dr Doreatha Lew (MDT) 07/03/10   Immunization History  Administered Date(s) Administered  . Influenza, High Dose Seasonal PF 07/18/2015  . Influenza,inj,Quad PF,36+ Mos  05/31/2014  . Influenza-Unspecified 09/16/2009, 07/03/2013  . Pneumococcal Conjugate-13 09/06/2002  . Pneumococcal Polysaccharide-23 07/18/2015  . Td 09/06/2002  . Zoster 09/06/2008   Past Surgical History:  Procedure Laterality Date  . CARDIAC CATHETERIZATION  2008  . CHOLECYSTECTOMY  1970s  . COLONOSCOPY N/A 11/11/2015   Procedure: COLONOSCOPY;  Surgeon: [name], MD;  Location: [facility];  Service: Endoscopy;  Laterality: N/A;  . ESOPHAGOGASTRODUODENOSCOPY N/A 11/11/2015   Procedure: ESOPHAGOGASTRODUODENOSCOPY (EGD);  Surgeon: [name], MD;  Location: [facility];  Service: Endoscopy;  Laterality: N/A;  . ESOPHAGOGASTRODUODENOSCOPY N/A 02/05/2016   Procedure: ESOPHAGOGASTRODUODENOSCOPY (EGD);  Surgeon: Ladene Artist, MD;  Location: [facility] ENDOSCOPY;  Service: Endoscopy;  Laterality: N/A;  . ESOPHAGOGASTRODUODENOSCOPY N/A 04/02/2016   Procedure: ESOPHAGOGASTRODUODENOSCOPY (EGD);  Surgeon: [name], MD;  Location: [facility];  Service: Endoscopy;  Laterality: N/A;  . GASTRECTOMY N/A 01/15/2016   Procedure: OPEN PARTIAL GASTRECTOMY;  Surgeon: Stark Klein, MD;  Location: [facility];  Service: General;  Laterality: N/A;  . GASTRECTOMY Left 02/24/2016   Procedure: OPEN J TUBE;  Surgeon: Stark Klein, MD;  Location: [place];  Service: General;  Laterality: Left;  . I&D KNEE WITH POLY EXCHANGE Right 12/17/2013   Procedure: IRRIGATION AND DEBRIDEMEN RIGHT TOTAL  KNEE WITH  POLY EXCHANGE;  Surgeon: Mauri Pole, MD;  Location: [facility];  Service: Orthopedics;  Laterality: Right;  . LAPAROSCOPY N/A 01/15/2016   Procedure: LAPAROSCOPY DIAGNOSTIC;  Surgeon: Stark Klein, MD;  Location: [facility];  Service: General;  Laterality: N/A;  . NEPHRECTOMY Left    renal cell cancer  . PACEMAKER INSERTION  07/04/11   MDT PPM implanted by Dr Doreatha Lew for tachy/brady  . PATELLAR TENDON REPAIR Right 03/06/2014   Procedure: AVULSION WITH PATELLA TENDON REPAIR;  Surgeon: Mauri Pole, MD;  Location: [facility];  Service: Orthopedics;  Laterality: Right;  . PATENT DUCTUS ARTERIOUS REPAIR  ~ 1949  . TOTAL ABDOMINAL HYSTERECTOMY  85  . TOTAL HIP REVISION Right 2009   initial replacment surg  . TOTAL KNEE ARTHROPLASTY  02/07/2012   Procedure: TOTAL KNEE ARTHROPLASTY;  Surgeon: Mauri Pole, MD;  Location: [facility];  Service: Orthopedics;  Laterality: Right;  . TOTAL KNEE REVISION Right 07/29/2014   Procedure: RIGHT KNEE REVISION OF PREVIOUS REPAIR EXTENSOR MECHANISM;  Surgeon: Mauri Pole, MD;  Location: [facility];  Service: Orthopedics;  Laterality: Right;  . UPPER GASTROINTESTINAL ENDOSCOPY     FHx:    Reviewed / unchanged  SHx:    Reviewed / unchanged  Systems Review:  Constitutional: Denies fever, chills, wt changes, headaches, insomnia, fatigue, night sweats, change in appetite. Eyes: Denies redness, blurred vision, diplopia, discharge, itchy, watery eyes.  ENT: Denies discharge, congestion, post nasal drip, epistaxis, sore throat, earache, hearing loss, dental pain, tinnitus, vertigo, sinus pain, snoring.  CV: Denies chest pain, palpitations, irregular heartbeat, syncope, dyspnea, diaphoresis, orthopnea, PND, claudication or edema. Respiratory: denies cough, dyspnea, DOE, pleurisy, hoarseness, laryngitis, wheezing.  Gastrointestinal: Denies dysphagia, odynophagia, heartburn, reflux, water brash, abdominal pain or cramps, nausea, vomiting, bloating, diarrhea, constipation, hematemesis,  melena, hematochezia  or hemorrhoids. Genitourinary: Denies dysuria, frequency, urgency, nocturia, hesitancy, discharge, hematuria or flank pain. Musculoskeletal: Denies arthralgias, myalgias, stiffness, jt. swelling, pain, limping or strain/sprain.  Skin: Denies pruritus, rash, hives, warts, acne, eczema or change in skin lesion(s). Neuro: No weakness, tremor, incoordination, spasms, paresthesia or pain. Psychiatric: Denies confusion, memory loss or sensory loss. Endo: Denies change in weight, skin or hair change.  Heme/Lymph: No  excessive bleeding, bruising or enlarged lymph nodes.  Physical Exam  BP (!) 142/80   Pulse 94   Temp 97.3 F (36.3 C)   Resp 14   Ht _0  (1.549 m)   Wt 142 lb 6.4 oz (64.6 kg)   LMP  (Exact Date)   SpO2 96%   BMI 26.91 kg/m   Appears chronically ill and uncomfortable. Eyes: PERRLA, EOMs, conjunctiva no swelling or erythema. Sinuses: No frontal/maxillary tenderness ENT/Mouth: EAC's clear, TM's nl w/o erythema, bulging. Nares clear w/o erythema, swelling, exudates. Oropharynx clear without erythema or exudates. Oral hygiene is good. Tongue normal, non obstructing. Hearing intact.  Neck: Supple. Thyroid nl. Car 2+/2+ without bruits, nodes or JVD. Chest: Respirations nl with BS clear & equal w/o rales, rhonchi, wheezing or stridor.  Cor: Heart sounds normal w/ regular rate and rhythm without sig. murmurs, gallops, clicks, or rubs. Peripheral pulses normal and equal  without edema.  Abdomen: Soft & bowel sounds normal. Non-tender w/o guarding, rebound, hernias, masses, or organomegaly.  Lymphatics: Unremarkable.  Musculoskeletal: Full ROM all peripheral extremities, joint stability, 5/5 strength, and normal gait.  Skin: Warm, dry without exposed rashes, lesions or ecchymosis apparent.  Neuro: Cranial nerves intact, reflexes equal bilaterally. Sensory-motor testing grossly intact. Tendon reflexes grossly intact.  Pysch: Alert & oriented x 3.  Insight and  judgement nl & appropriate. No ideations.  Assessment and Plan:  1. Essential hypertension  - Continue medication, monitor blood pressure at home.  Reminder to go to the ER if any CP, SOB, nausea, dizziness, severe HA, changes vision/speech, left arm numbness and tingling and jaw pain. - TSH  2. Prediabetes  - Continue diet/meds, exercise & lifestyle modifications - Hemoglobin A1c - Insulin, random  3. Vitamin D deficiency  - Continue supplementation. - VITAMIN D 25 Hydroxy   4. Vitamin B12 deficiency  5. Hypothyroidism   6. Medication management  - CBC with Differential/Platelet - BASIC METABOLIC PANEL WITH GFR - Hepatic function panel - Magnesium      Recommended regular exercise, BP monitoring, weight control, and discussed med and SE's. Recommended labs to assess and monitor clinical status. Further disposition pending results of labs. Over 30 minutes of exam, counseling, chart review was performed

## 2016-04-22 DIAGNOSIS — K210 Gastro-esophageal reflux disease with esophagitis: Secondary | ICD-10-CM | POA: Diagnosis not present

## 2016-04-22 DIAGNOSIS — I480 Paroxysmal atrial fibrillation: Secondary | ICD-10-CM | POA: Diagnosis not present

## 2016-04-22 DIAGNOSIS — K259 Gastric ulcer, unspecified as acute or chronic, without hemorrhage or perforation: Secondary | ICD-10-CM | POA: Diagnosis not present

## 2016-04-22 DIAGNOSIS — I2510 Atherosclerotic heart disease of native coronary artery without angina pectoris: Secondary | ICD-10-CM | POA: Diagnosis not present

## 2016-04-22 DIAGNOSIS — D649 Anemia, unspecified: Secondary | ICD-10-CM | POA: Diagnosis not present

## 2016-04-22 DIAGNOSIS — C169 Malignant neoplasm of stomach, unspecified: Secondary | ICD-10-CM | POA: Diagnosis not present

## 2016-04-22 DIAGNOSIS — Z934 Other artificial openings of gastrointestinal tract status: Secondary | ICD-10-CM | POA: Diagnosis not present

## 2016-04-22 DIAGNOSIS — Z79891 Long term (current) use of opiate analgesic: Secondary | ICD-10-CM | POA: Diagnosis not present

## 2016-04-22 DIAGNOSIS — C55 Malignant neoplasm of uterus, part unspecified: Secondary | ICD-10-CM | POA: Diagnosis not present

## 2016-04-22 DIAGNOSIS — Z483 Aftercare following surgery for neoplasm: Secondary | ICD-10-CM | POA: Diagnosis not present

## 2016-04-22 DIAGNOSIS — C649 Malignant neoplasm of unspecified kidney, except renal pelvis: Secondary | ICD-10-CM | POA: Diagnosis not present

## 2016-04-22 DIAGNOSIS — Z7901 Long term (current) use of anticoagulants: Secondary | ICD-10-CM | POA: Diagnosis not present

## 2016-04-22 DIAGNOSIS — I130 Hypertensive heart and chronic kidney disease with heart failure and stage 1 through stage 4 chronic kidney disease, or unspecified chronic kidney disease: Secondary | ICD-10-CM | POA: Diagnosis not present

## 2016-04-22 LAB — CBC WITH DIFFERENTIAL/PLATELET
Basophils Absolute: 0 cells/uL (ref 0–200)
Basophils Relative: 0 %
EOS PCT: 3 %
Eosinophils Absolute: 333 cells/uL (ref 15–500)
HCT: 44.4 % (ref 35.0–45.0)
HEMOGLOBIN: 14.6 g/dL (ref 11.7–15.5)
LYMPHS ABS: 1443 {cells}/uL (ref 850–3900)
Lymphocytes Relative: 13 %
MCH: 31.3 pg (ref 27.0–33.0)
MCHC: 32.9 g/dL (ref 32.0–36.0)
MCV: 95.3 fL (ref 80.0–100.0)
MONO ABS: 555 {cells}/uL (ref 200–950)
MONOS PCT: 5 %
MPV: 10 fL (ref 7.5–12.5)
Neutro Abs: 8769 cells/uL — ABNORMAL HIGH (ref 1500–7800)
Neutrophils Relative %: 79 %
PLATELETS: 267 10*3/uL (ref 140–400)
RBC: 4.66 MIL/uL (ref 3.80–5.10)
RDW: 15.1 % — ABNORMAL HIGH (ref 11.0–15.0)
WBC: 11.1 10*3/uL — AB (ref 3.8–10.8)

## 2016-04-22 LAB — VITAMIN D 25 HYDROXY (VIT D DEFICIENCY, FRACTURES): VIT D 25 HYDROXY: 50 ng/mL (ref 30–100)

## 2016-04-22 LAB — HEMOGLOBIN A1C
HEMOGLOBIN A1C: 5.3 % (ref <5.7)
MEAN PLASMA GLUCOSE: 105 mg/dL

## 2016-04-24 ENCOUNTER — Encounter: Payer: Self-pay | Admitting: Internal Medicine

## 2016-04-26 ENCOUNTER — Telehealth: Payer: Self-pay | Admitting: Cardiology

## 2016-04-26 ENCOUNTER — Ambulatory Visit (INDEPENDENT_AMBULATORY_CARE_PROVIDER_SITE_OTHER): Payer: Medicare Other | Admitting: Cardiovascular Disease

## 2016-04-26 DIAGNOSIS — I130 Hypertensive heart and chronic kidney disease with heart failure and stage 1 through stage 4 chronic kidney disease, or unspecified chronic kidney disease: Secondary | ICD-10-CM | POA: Diagnosis not present

## 2016-04-26 DIAGNOSIS — I480 Paroxysmal atrial fibrillation: Secondary | ICD-10-CM | POA: Diagnosis not present

## 2016-04-26 DIAGNOSIS — K259 Gastric ulcer, unspecified as acute or chronic, without hemorrhage or perforation: Secondary | ICD-10-CM | POA: Diagnosis not present

## 2016-04-26 DIAGNOSIS — C649 Malignant neoplasm of unspecified kidney, except renal pelvis: Secondary | ICD-10-CM | POA: Diagnosis not present

## 2016-04-26 DIAGNOSIS — I2510 Atherosclerotic heart disease of native coronary artery without angina pectoris: Secondary | ICD-10-CM | POA: Diagnosis not present

## 2016-04-26 DIAGNOSIS — K210 Gastro-esophageal reflux disease with esophagitis: Secondary | ICD-10-CM | POA: Diagnosis not present

## 2016-04-26 DIAGNOSIS — Z7901 Long term (current) use of anticoagulants: Secondary | ICD-10-CM | POA: Diagnosis not present

## 2016-04-26 DIAGNOSIS — Z483 Aftercare following surgery for neoplasm: Secondary | ICD-10-CM | POA: Diagnosis not present

## 2016-04-26 DIAGNOSIS — Z934 Other artificial openings of gastrointestinal tract status: Secondary | ICD-10-CM | POA: Diagnosis not present

## 2016-04-26 DIAGNOSIS — D649 Anemia, unspecified: Secondary | ICD-10-CM | POA: Diagnosis not present

## 2016-04-26 DIAGNOSIS — Z79891 Long term (current) use of opiate analgesic: Secondary | ICD-10-CM | POA: Diagnosis not present

## 2016-04-26 DIAGNOSIS — C55 Malignant neoplasm of uterus, part unspecified: Secondary | ICD-10-CM | POA: Diagnosis not present

## 2016-04-26 DIAGNOSIS — C169 Malignant neoplasm of stomach, unspecified: Secondary | ICD-10-CM | POA: Diagnosis not present

## 2016-04-26 LAB — POCT INR: INR: 2.4

## 2016-04-26 NOTE — Telephone Encounter (Signed)
Left msg requesting return call

## 2016-04-26 NOTE — Telephone Encounter (Signed)
Spoke to Marcie Bal, who is [place]. Notes she is covering for patient's usual RN. Notes she took BP today and it was [id number] systolic when checked (did a reading on each arm). Notes patient is in quite a bit of pain today as compared to usual. Her usual BPs are typically at [id number] systolic range. Pt is c/o abdominal pain and she thinks related to patient's chronic issues - gastric adenocarcinoma.  Pt has 2 daughters at home w/ her for around the clock care.  Marcie Bal notes pt on tramadol for pain control. She is being referred to palliative care by Dr. Melford Aase.  Advised BP elevation likely affected by pt's pain - if pain is severe and/or cannot be resolved w OTC meds to have pt seen in ER. O/w, reach out to internal med to see if they can advise on pain relief.  Informed caller I would see if Dr. [name] has any additional advice. Understanding verbalized.

## 2016-04-26 NOTE — Telephone Encounter (Signed)
New message   [name]  Pt c/o BP issue: STAT if pt c/o blurred vision, one-sided weakness or slurred speech  1. What are your last 5 BP readings? 194/94  HR   76  2. Are you having any other symptoms (ex. Dizziness, headache, blurred vision, passed out)? NO 3. What is your BP issue? HIGH   Pt is having chronic abdominal pain and the pain and nausea is more severe and [name] stated she is obligated to call.

## 2016-04-26 NOTE — Telephone Encounter (Signed)
Agree I would not alter BP meds based on this reading. BP elevation likely related to uncontrolled pain.   Berkley Wrightsman [name] MD, [facility]

## 2016-04-27 ENCOUNTER — Telehealth: Payer: Self-pay | Admitting: *Deleted

## 2016-04-27 DIAGNOSIS — Z79891 Long term (current) use of opiate analgesic: Secondary | ICD-10-CM | POA: Diagnosis not present

## 2016-04-27 DIAGNOSIS — C649 Malignant neoplasm of unspecified kidney, except renal pelvis: Secondary | ICD-10-CM | POA: Diagnosis not present

## 2016-04-27 DIAGNOSIS — I130 Hypertensive heart and chronic kidney disease with heart failure and stage 1 through stage 4 chronic kidney disease, or unspecified chronic kidney disease: Secondary | ICD-10-CM | POA: Diagnosis not present

## 2016-04-27 DIAGNOSIS — Z934 Other artificial openings of gastrointestinal tract status: Secondary | ICD-10-CM | POA: Diagnosis not present

## 2016-04-27 DIAGNOSIS — K259 Gastric ulcer, unspecified as acute or chronic, without hemorrhage or perforation: Secondary | ICD-10-CM | POA: Diagnosis not present

## 2016-04-27 DIAGNOSIS — I480 Paroxysmal atrial fibrillation: Secondary | ICD-10-CM | POA: Diagnosis not present

## 2016-04-27 DIAGNOSIS — K210 Gastro-esophageal reflux disease with esophagitis: Secondary | ICD-10-CM | POA: Diagnosis not present

## 2016-04-27 DIAGNOSIS — C55 Malignant neoplasm of uterus, part unspecified: Secondary | ICD-10-CM | POA: Diagnosis not present

## 2016-04-27 DIAGNOSIS — D649 Anemia, unspecified: Secondary | ICD-10-CM | POA: Diagnosis not present

## 2016-04-27 DIAGNOSIS — C169 Malignant neoplasm of stomach, unspecified: Secondary | ICD-10-CM | POA: Diagnosis not present

## 2016-04-27 DIAGNOSIS — I2510 Atherosclerotic heart disease of native coronary artery without angina pectoris: Secondary | ICD-10-CM | POA: Diagnosis not present

## 2016-04-27 DIAGNOSIS — Z7901 Long term (current) use of anticoagulants: Secondary | ICD-10-CM | POA: Diagnosis not present

## 2016-04-27 DIAGNOSIS — Z483 Aftercare following surgery for neoplasm: Secondary | ICD-10-CM | POA: Diagnosis not present

## 2016-04-27 NOTE — Telephone Encounter (Signed)
Pt aware of lab results 

## 2016-04-29 DIAGNOSIS — Z934 Other artificial openings of gastrointestinal tract status: Secondary | ICD-10-CM | POA: Diagnosis not present

## 2016-04-29 DIAGNOSIS — C649 Malignant neoplasm of unspecified kidney, except renal pelvis: Secondary | ICD-10-CM | POA: Diagnosis not present

## 2016-04-29 DIAGNOSIS — I480 Paroxysmal atrial fibrillation: Secondary | ICD-10-CM | POA: Diagnosis not present

## 2016-04-29 DIAGNOSIS — Z7901 Long term (current) use of anticoagulants: Secondary | ICD-10-CM | POA: Diagnosis not present

## 2016-04-29 DIAGNOSIS — I130 Hypertensive heart and chronic kidney disease with heart failure and stage 1 through stage 4 chronic kidney disease, or unspecified chronic kidney disease: Secondary | ICD-10-CM | POA: Diagnosis not present

## 2016-04-29 DIAGNOSIS — Z483 Aftercare following surgery for neoplasm: Secondary | ICD-10-CM | POA: Diagnosis not present

## 2016-04-29 DIAGNOSIS — D649 Anemia, unspecified: Secondary | ICD-10-CM | POA: Diagnosis not present

## 2016-04-29 DIAGNOSIS — Z79891 Long term (current) use of opiate analgesic: Secondary | ICD-10-CM | POA: Diagnosis not present

## 2016-04-29 DIAGNOSIS — I2510 Atherosclerotic heart disease of native coronary artery without angina pectoris: Secondary | ICD-10-CM | POA: Diagnosis not present

## 2016-04-29 DIAGNOSIS — K210 Gastro-esophageal reflux disease with esophagitis: Secondary | ICD-10-CM | POA: Diagnosis not present

## 2016-04-29 DIAGNOSIS — C169 Malignant neoplasm of stomach, unspecified: Secondary | ICD-10-CM | POA: Diagnosis not present

## 2016-04-29 DIAGNOSIS — C55 Malignant neoplasm of uterus, part unspecified: Secondary | ICD-10-CM | POA: Diagnosis not present

## 2016-04-29 DIAGNOSIS — K259 Gastric ulcer, unspecified as acute or chronic, without hemorrhage or perforation: Secondary | ICD-10-CM | POA: Diagnosis not present

## 2016-05-04 DIAGNOSIS — K210 Gastro-esophageal reflux disease with esophagitis: Secondary | ICD-10-CM | POA: Diagnosis not present

## 2016-05-04 DIAGNOSIS — I480 Paroxysmal atrial fibrillation: Secondary | ICD-10-CM | POA: Diagnosis not present

## 2016-05-04 DIAGNOSIS — C649 Malignant neoplasm of unspecified kidney, except renal pelvis: Secondary | ICD-10-CM | POA: Diagnosis not present

## 2016-05-04 DIAGNOSIS — K259 Gastric ulcer, unspecified as acute or chronic, without hemorrhage or perforation: Secondary | ICD-10-CM | POA: Diagnosis not present

## 2016-05-04 DIAGNOSIS — Z79891 Long term (current) use of opiate analgesic: Secondary | ICD-10-CM | POA: Diagnosis not present

## 2016-05-04 DIAGNOSIS — Z7901 Long term (current) use of anticoagulants: Secondary | ICD-10-CM | POA: Diagnosis not present

## 2016-05-04 DIAGNOSIS — I130 Hypertensive heart and chronic kidney disease with heart failure and stage 1 through stage 4 chronic kidney disease, or unspecified chronic kidney disease: Secondary | ICD-10-CM | POA: Diagnosis not present

## 2016-05-04 DIAGNOSIS — D649 Anemia, unspecified: Secondary | ICD-10-CM | POA: Diagnosis not present

## 2016-05-04 DIAGNOSIS — Z934 Other artificial openings of gastrointestinal tract status: Secondary | ICD-10-CM | POA: Diagnosis not present

## 2016-05-04 DIAGNOSIS — C55 Malignant neoplasm of uterus, part unspecified: Secondary | ICD-10-CM | POA: Diagnosis not present

## 2016-05-04 DIAGNOSIS — Z483 Aftercare following surgery for neoplasm: Secondary | ICD-10-CM | POA: Diagnosis not present

## 2016-05-04 DIAGNOSIS — C169 Malignant neoplasm of stomach, unspecified: Secondary | ICD-10-CM | POA: Diagnosis not present

## 2016-05-04 DIAGNOSIS — I2510 Atherosclerotic heart disease of native coronary artery without angina pectoris: Secondary | ICD-10-CM | POA: Diagnosis not present

## 2016-05-05 ENCOUNTER — Other Ambulatory Visit: Payer: Self-pay | Admitting: *Deleted

## 2016-05-05 ENCOUNTER — Ambulatory Visit: Payer: Self-pay | Admitting: Internal Medicine

## 2016-05-05 MED ORDER — METOPROLOL TARTRATE 100 MG PO TABS: 100.0000 mg | ORAL_TABLET | Freq: Two times a day (BID) | ORAL | 1 refills | Status: DC

## 2016-05-05 MED ORDER — DILTIAZEM HCL ER COATED BEADS 180 MG PO CP24: 180.0000 mg | ORAL_CAPSULE | Freq: Every day | ORAL | 1 refills | Status: DC

## 2016-05-06 DIAGNOSIS — C649 Malignant neoplasm of unspecified kidney, except renal pelvis: Secondary | ICD-10-CM | POA: Diagnosis not present

## 2016-05-06 DIAGNOSIS — Z7901 Long term (current) use of anticoagulants: Secondary | ICD-10-CM | POA: Diagnosis not present

## 2016-05-06 DIAGNOSIS — I2510 Atherosclerotic heart disease of native coronary artery without angina pectoris: Secondary | ICD-10-CM | POA: Diagnosis not present

## 2016-05-06 DIAGNOSIS — Z483 Aftercare following surgery for neoplasm: Secondary | ICD-10-CM | POA: Diagnosis not present

## 2016-05-06 DIAGNOSIS — I480 Paroxysmal atrial fibrillation: Secondary | ICD-10-CM | POA: Diagnosis not present

## 2016-05-06 DIAGNOSIS — D649 Anemia, unspecified: Secondary | ICD-10-CM | POA: Diagnosis not present

## 2016-05-06 DIAGNOSIS — C169 Malignant neoplasm of stomach, unspecified: Secondary | ICD-10-CM | POA: Diagnosis not present

## 2016-05-06 DIAGNOSIS — K210 Gastro-esophageal reflux disease with esophagitis: Secondary | ICD-10-CM | POA: Diagnosis not present

## 2016-05-06 DIAGNOSIS — Z79891 Long term (current) use of opiate analgesic: Secondary | ICD-10-CM | POA: Diagnosis not present

## 2016-05-06 DIAGNOSIS — C55 Malignant neoplasm of uterus, part unspecified: Secondary | ICD-10-CM | POA: Diagnosis not present

## 2016-05-06 DIAGNOSIS — K259 Gastric ulcer, unspecified as acute or chronic, without hemorrhage or perforation: Secondary | ICD-10-CM | POA: Diagnosis not present

## 2016-05-06 DIAGNOSIS — I130 Hypertensive heart and chronic kidney disease with heart failure and stage 1 through stage 4 chronic kidney disease, or unspecified chronic kidney disease: Secondary | ICD-10-CM | POA: Diagnosis not present

## 2016-05-06 DIAGNOSIS — Z934 Other artificial openings of gastrointestinal tract status: Secondary | ICD-10-CM | POA: Diagnosis not present

## 2016-05-07 DIAGNOSIS — I2510 Atherosclerotic heart disease of native coronary artery without angina pectoris: Secondary | ICD-10-CM | POA: Diagnosis not present

## 2016-05-07 DIAGNOSIS — C55 Malignant neoplasm of uterus, part unspecified: Secondary | ICD-10-CM | POA: Diagnosis not present

## 2016-05-07 DIAGNOSIS — K259 Gastric ulcer, unspecified as acute or chronic, without hemorrhage or perforation: Secondary | ICD-10-CM | POA: Diagnosis not present

## 2016-05-07 DIAGNOSIS — I480 Paroxysmal atrial fibrillation: Secondary | ICD-10-CM | POA: Diagnosis not present

## 2016-05-07 DIAGNOSIS — Z7901 Long term (current) use of anticoagulants: Secondary | ICD-10-CM | POA: Diagnosis not present

## 2016-05-07 DIAGNOSIS — C649 Malignant neoplasm of unspecified kidney, except renal pelvis: Secondary | ICD-10-CM | POA: Diagnosis not present

## 2016-05-07 DIAGNOSIS — Z934 Other artificial openings of gastrointestinal tract status: Secondary | ICD-10-CM | POA: Diagnosis not present

## 2016-05-07 DIAGNOSIS — C169 Malignant neoplasm of stomach, unspecified: Secondary | ICD-10-CM | POA: Diagnosis not present

## 2016-05-07 DIAGNOSIS — I130 Hypertensive heart and chronic kidney disease with heart failure and stage 1 through stage 4 chronic kidney disease, or unspecified chronic kidney disease: Secondary | ICD-10-CM | POA: Diagnosis not present

## 2016-05-07 DIAGNOSIS — D649 Anemia, unspecified: Secondary | ICD-10-CM | POA: Diagnosis not present

## 2016-05-07 DIAGNOSIS — Z483 Aftercare following surgery for neoplasm: Secondary | ICD-10-CM | POA: Diagnosis not present

## 2016-05-07 DIAGNOSIS — K210 Gastro-esophageal reflux disease with esophagitis: Secondary | ICD-10-CM | POA: Diagnosis not present

## 2016-05-07 DIAGNOSIS — Z79891 Long term (current) use of opiate analgesic: Secondary | ICD-10-CM | POA: Diagnosis not present

## 2016-05-07 MED FILL — oxyCODONE HCL 5 MG/5ML SOLN: 5 | 6 days supply | Qty: 180 | Fill #0

## 2016-05-11 ENCOUNTER — Ambulatory Visit (INDEPENDENT_AMBULATORY_CARE_PROVIDER_SITE_OTHER): Payer: Medicare Other | Admitting: Cardiology

## 2016-05-11 DIAGNOSIS — Z483 Aftercare following surgery for neoplasm: Secondary | ICD-10-CM | POA: Diagnosis not present

## 2016-05-11 DIAGNOSIS — Z934 Other artificial openings of gastrointestinal tract status: Secondary | ICD-10-CM | POA: Diagnosis not present

## 2016-05-11 DIAGNOSIS — C169 Malignant neoplasm of stomach, unspecified: Secondary | ICD-10-CM | POA: Diagnosis not present

## 2016-05-11 DIAGNOSIS — K259 Gastric ulcer, unspecified as acute or chronic, without hemorrhage or perforation: Secondary | ICD-10-CM | POA: Diagnosis not present

## 2016-05-11 DIAGNOSIS — C649 Malignant neoplasm of unspecified kidney, except renal pelvis: Secondary | ICD-10-CM | POA: Diagnosis not present

## 2016-05-11 DIAGNOSIS — K210 Gastro-esophageal reflux disease with esophagitis: Secondary | ICD-10-CM | POA: Diagnosis not present

## 2016-05-11 DIAGNOSIS — I2510 Atherosclerotic heart disease of native coronary artery without angina pectoris: Secondary | ICD-10-CM | POA: Diagnosis not present

## 2016-05-11 DIAGNOSIS — Z7901 Long term (current) use of anticoagulants: Secondary | ICD-10-CM

## 2016-05-11 DIAGNOSIS — D649 Anemia, unspecified: Secondary | ICD-10-CM | POA: Diagnosis not present

## 2016-05-11 DIAGNOSIS — I480 Paroxysmal atrial fibrillation: Secondary | ICD-10-CM | POA: Diagnosis not present

## 2016-05-11 DIAGNOSIS — I130 Hypertensive heart and chronic kidney disease with heart failure and stage 1 through stage 4 chronic kidney disease, or unspecified chronic kidney disease: Secondary | ICD-10-CM | POA: Diagnosis not present

## 2016-05-11 DIAGNOSIS — Z79891 Long term (current) use of opiate analgesic: Secondary | ICD-10-CM | POA: Diagnosis not present

## 2016-05-11 LAB — POCT INR: INR: 2

## 2016-05-12 ENCOUNTER — Encounter (HOSPITAL_COMMUNITY)
Admission: RE | Admit: 2016-05-12 | Discharge: 2016-05-12 | Disposition: A | Discharge status: Home / Self Care | Payer: Medicare Other | Source: Ambulatory Visit | Attending: General Surgery | Admitting: General Surgery

## 2016-05-12 ENCOUNTER — Encounter (HOSPITAL_COMMUNITY): Payer: Self-pay

## 2016-05-12 DIAGNOSIS — Z01812 Encounter for preprocedural laboratory examination: Secondary | ICD-10-CM | POA: Insufficient documentation

## 2016-05-12 DIAGNOSIS — C169 Malignant neoplasm of stomach, unspecified: Secondary | ICD-10-CM | POA: Diagnosis not present

## 2016-05-12 LAB — CBC WITH DIFFERENTIAL/PLATELET
BASOS ABS: 0 10*3/uL (ref 0.0–0.1)
Basophils Relative: 0 %
Eosinophils Absolute: 0.3 10*3/uL (ref 0.0–0.7)
Eosinophils Relative: 3 %
HCT: 42.9 % (ref 36.0–46.0)
HEMOGLOBIN: 14.5 g/dL (ref 12.0–15.0)
LYMPHS ABS: 1.4 10*3/uL (ref 0.7–4.0)
LYMPHS PCT: 15 %
MCH: 31.6 pg (ref 26.0–34.0)
MCHC: 33.8 g/dL (ref 30.0–36.0)
MCV: 93.5 fL (ref 78.0–100.0)
Monocytes Absolute: 0.6 10*3/uL (ref 0.1–1.0)
Monocytes Relative: 6 %
NEUTROS PCT: 76 %
Neutro Abs: 7 10*3/uL (ref 1.7–7.7)
Platelets: 261 10*3/uL (ref 150–400)
RBC: 4.59 MIL/uL (ref 3.87–5.11)
RDW: 14 % (ref 11.5–15.5)
WBC: 9.3 10*3/uL (ref 4.0–10.5)

## 2016-05-12 LAB — BASIC METABOLIC PANEL
ANION GAP: 12 (ref 5–15)
BUN: 19 mg/dL (ref 6–20)
CHLORIDE: 96 mmol/L — AB (ref 101–111)
CO2: 29 mmol/L (ref 22–32)
Calcium: 9.4 mg/dL (ref 8.9–10.3)
Creatinine, Ser: 0.67 mg/dL (ref 0.44–1.00)
GFR calc non Af Amer: >60 mL/min (ref >60)
Glucose, Bld: 113 mg/dL — ABNORMAL HIGH (ref 65–99)
POTASSIUM: 3.1 mmol/L — AB (ref 3.5–5.1)
SODIUM: 137 mmol/L (ref 135–145)

## 2016-05-12 LAB — NO BLOOD PRODUCTS

## 2016-05-12 NOTE — Pre-Procedure Instructions (Signed)
[name]  05/12/2016      [id number] - [place], [address] [place] [address] Phone: [phone] Fax: [phone]    Your procedure is scheduled on   Tuesday  05/18/16  Report to [facility] Admitting at 530 A.M.  Call this number if you have problems the morning of surgery:  [phone]   Remember:  Do not eat food or drink liquids after midnight.  Take these medicines the morning of surgery with A SIP OF WATER   Tylenol or tramadol or oxycodone if needed, alprazolam (xanax), diltiazem (cardizem), gabapentin, levothyroxine (synthroid), metoprolol (lopressor), pantoprazole (protonix), ranitidine (zantac)     (STOP COUMADIN AS DIRECTED)    Do not wear jewelry, make-up or nail polish.  Do not wear lotions, powders, or perfumes, or deoderant.  Do not shave 48 hours prior to surgery.  Men may shave face and neck.  Do not bring valuables to the hospital.  [facility] is not responsible for any belongings or valuables.  Contacts, dentures or bridgework may not be worn into surgery.  Leave your suitcase in the car.  After surgery it may be brought to your room.  For patients admitted to the hospital, discharge time will be determined by your treatment team.  Patients discharged the day of surgery will not be allowed to drive home.   Name and phone number of your driver:    Special instructions:  [facility] - Preparing for Surgery  Before surgery, you can play an important role.  Because skin is not sterile, your skin needs to be as free of germs as possible.  You can reduce the number of germs on you skin by washing with CHG (chlorahexidine gluconate) soap before surgery.  CHG is an antiseptic cleaner which kills germs and bonds with the skin to continue killing germs even after washing.  Please DO NOT use if you have an allergy to CHG or antibacterial soaps.  If your skin becomes reddened/irritated stop  using the CHG and inform your nurse when you arrive at Short Stay.  Do not shave (including legs and underarms) for at least 48 hours prior to the first CHG shower.  You may shave your face.  Please follow these instructions carefully:   1.  Shower with CHG Soap the night before surgery and the                                morning of Surgery.  2.  If you choose to wash your hair, wash your hair first as usual with your       normal shampoo.  3.  After you shampoo, rinse your hair and body thoroughly to remove the                      Shampoo.  4.  Use CHG as you would any other liquid soap.  You can apply chg directly       to the skin and wash gently with scrungie or a clean washcloth.  5.  Apply the CHG Soap to your body ONLY FROM THE NECK DOWN.        Do not use on open wounds or open sores.  Avoid contact with your eyes,       ears, mouth and genitals (private parts).  Wash genitals (private parts)  with your normal soap.  6.  Wash thoroughly, paying special attention to the area where your surgery        will be performed.  7.  Thoroughly rinse your body with warm water from the neck down.  8.  DO NOT shower/wash with your normal soap after using and rinsing off       the CHG Soap.  9.  Pat yourself dry with a clean towel.            10.  Wear clean pajamas.            11.  Place clean sheets on your bed the night of your first shower and do not        sleep with pets.  Day of Surgery  Do not apply any lotions/deoderants the morning of surgery.  Please wear clean clothes to the hospital/surgery center.    Please read over the following fact sheets that you were given. Pain Booklet and Coughing and Deep Breathing

## 2016-05-12 NOTE — Progress Notes (Signed)
According to patient's daughter , last dose of coumadin will be tonight.  Will check PT/ INR DOS.  FAXED ICD / PM ORDER FORM TO DEVICE CLINIC.   FAXED BLOOD REFUSAL TO BLOOD BANK.

## 2016-05-13 NOTE — Progress Notes (Signed)
Anesthesia Chart Review:  Pt is an 82 year old female scheduled for exploratory laparotomy, possible pyloromyotomy, roux en y gastrojejunostomy, possible esophagojejunostomy on 05/18/2016 with Stark Klein, MD.   Pt is a Jehovah's Witness, no blood products.   - Cardiologist is [name], MD  - EP cardiology is Thompson Grayer, MD.  - PCP is [name], MD.   PMH includes:  CAD, persistent atrial fibrillation (as of 02/2016 hospitalization), tachycardia-bradycardia syndrome, pacemaker ([employer]tr[employer] implanted 2011), moderate aortic stenosis, aortic root dilatation, CHF, HTN, hyperlipidemia, hepatic steatosis, stroke (2015), anemia, hypothyroidism, uterine cancer, renal cancer (s/p nephrectomy), adenocarcinoma of stomach, post-op N/V, GERD. Never smoker. BMI 27. S/p subtotal gastrectomy 01/15/16. S/p R TKA 02/07/12. S/p patent ductus arteriosus repair.   Pt hospitalized 6/13-7/11/17 for esophagitis, gastric ulcers, vomiting, intolerance of tube feeding.   Pt hospitalized 5/31-02/13/16 for intractable vomiting, esophagitis.   Medications include: diltiazem, lasix, levothyroxine, metoprolol, protonix, potassium, coumadin. Last dose coumadin 05/12/16.   Preoperative labs reviewed.  PT/INR will be obtained DOS.   1 view CXR 02/18/16: New left arm PICC projects to the lower SVC level. Interval clearing of the lungs.  EKG 02/17/16: Atrial fibrillation. Repol abnrm suggests ischemia, diffuse leads. History of atrial fibrillation with depressions in inferolateral leads on 01/24/2016  - persistent atrial fibrillation as of 02/2016 hospitalization. HR was 87 at PAT  Nuclear stress test 11/21/15: Normal stress nuclear study with no ischemia or infarction; EF 62 with normal wall motion; note patient in atrial flutter at time of study.  Echo 11/14/15:  - Left ventricle: The cavity size was normal. There was mild concentric hypertrophy. Systolic function was normal. The estimated ejection fraction was in the  range of 60% to 65%. Wall motion was normal; there were no regional wall motion abnormalities. The study was not technically sufficient to allow evaluation of LV diastolic dysfunction due to atrial fibrillation. - Aortic valve: Valve mobility was restricted. There was mild to moderate stenosis. There was moderate regurgitation. Peak gradient (S): 38 mm Hg. Valve area (VTI): 0.55 cm^2. Valve area (Vmax): 0.57 cm^2. Valve area (Vmean): 0.59 cm^2. - Aortic root: The aortic root was normal in size. - Ascending aorta: The ascending aorta was normal in size. - Mitral valve: Calcified annulus. Moderately thickened, severely calcified leaflets . Mobility was restricted. The findings are consistent with moderate stenosis. There was mild regurgitation. Valve area by continuity equation (using LVOT flow): 0.82 cm^2. - Left atrium: The atrium was mildly dilated. - Right ventricle: The cavity size was mildly dilated. Wall thickness was normal. Systolic function was mildly reduced. - Right atrium: The atrium was mildly dilated. - Tricuspid valve: There was moderate regurgitation. - Pulmonary arteries: Systolic pressure was moderately to severely increased. PA peak pressure: 55 mm Hg (S). - Inferior vena cava: The vessel was dilated. The respirophasic diameter changes were blunted (< 50%), consistent with elevated central venous pressure. - Pericardium, extracardiac: There was no pericardial effusion.  Perioperative prescription form for pacemaker notes pt is not pacemaker dependent and that procedure may interfere with device function. Magnet should be placed over device during procedure. Post-op interrogation not needed.   If no changes, I anticipate pt can proceed with surgery as scheduled.   [name], FNP-BC [facility] Short Stay Surgical Center/Anesthesiology Phone: [phone] 05/13/2016 4:32 PM

## 2016-05-17 DIAGNOSIS — K259 Gastric ulcer, unspecified as acute or chronic, without hemorrhage or perforation: Secondary | ICD-10-CM | POA: Diagnosis not present

## 2016-05-17 DIAGNOSIS — Z79891 Long term (current) use of opiate analgesic: Secondary | ICD-10-CM | POA: Diagnosis not present

## 2016-05-17 DIAGNOSIS — K210 Gastro-esophageal reflux disease with esophagitis: Secondary | ICD-10-CM | POA: Diagnosis not present

## 2016-05-17 DIAGNOSIS — Z7901 Long term (current) use of anticoagulants: Secondary | ICD-10-CM | POA: Diagnosis not present

## 2016-05-17 DIAGNOSIS — I130 Hypertensive heart and chronic kidney disease with heart failure and stage 1 through stage 4 chronic kidney disease, or unspecified chronic kidney disease: Secondary | ICD-10-CM | POA: Diagnosis not present

## 2016-05-17 DIAGNOSIS — D649 Anemia, unspecified: Secondary | ICD-10-CM | POA: Diagnosis not present

## 2016-05-17 DIAGNOSIS — Z483 Aftercare following surgery for neoplasm: Secondary | ICD-10-CM | POA: Diagnosis not present

## 2016-05-17 DIAGNOSIS — C169 Malignant neoplasm of stomach, unspecified: Secondary | ICD-10-CM | POA: Diagnosis not present

## 2016-05-17 DIAGNOSIS — Z934 Other artificial openings of gastrointestinal tract status: Secondary | ICD-10-CM | POA: Diagnosis not present

## 2016-05-17 DIAGNOSIS — C649 Malignant neoplasm of unspecified kidney, except renal pelvis: Secondary | ICD-10-CM | POA: Diagnosis not present

## 2016-05-17 DIAGNOSIS — I2510 Atherosclerotic heart disease of native coronary artery without angina pectoris: Secondary | ICD-10-CM | POA: Diagnosis not present

## 2016-05-17 DIAGNOSIS — I480 Paroxysmal atrial fibrillation: Secondary | ICD-10-CM | POA: Diagnosis not present

## 2016-05-17 MED ORDER — CEFAZOLIN SODIUM-DEXTROSE 2-4 GM/100ML-% IV SOLN
2.0000 g | INTRAVENOUS | Status: AC
  Administered 2016-05-18 (×2): 2 g via INTRAVENOUS
  Filled 2016-05-17: qty 100

## 2016-05-18 ENCOUNTER — Encounter (HOSPITAL_COMMUNITY): Payer: Self-pay | Admitting: Certified Registered Nurse Anesthetist

## 2016-05-18 ENCOUNTER — Inpatient Hospital Stay (HOSPITAL_COMMUNITY)
Admission: RE | Admit: 2016-05-18 | Discharge: 2016-05-31 | DRG: 327 | Disposition: A | Discharge status: Home Health | Payer: Medicare Other | Source: Ambulatory Visit | Attending: General Surgery | Admitting: General Surgery

## 2016-05-18 ENCOUNTER — Inpatient Hospital Stay (HOSPITAL_COMMUNITY): Payer: Medicare Other | Admitting: Emergency Medicine

## 2016-05-18 ENCOUNTER — Inpatient Hospital Stay (HOSPITAL_COMMUNITY): Payer: Medicare Other | Admitting: Anesthesiology

## 2016-05-18 ENCOUNTER — Encounter (HOSPITAL_COMMUNITY)
Admission: RE | Disposition: A | Discharge status: Home Health | Payer: Self-pay | Source: Ambulatory Visit | Attending: General Surgery

## 2016-05-18 DIAGNOSIS — I509 Heart failure, unspecified: Secondary | ICD-10-CM | POA: Diagnosis not present

## 2016-05-18 DIAGNOSIS — I480 Paroxysmal atrial fibrillation: Secondary | ICD-10-CM | POA: Diagnosis not present

## 2016-05-18 DIAGNOSIS — Z903 Acquired absence of stomach [part of]: Secondary | ICD-10-CM | POA: Diagnosis not present

## 2016-05-18 DIAGNOSIS — R140 Abdominal distension (gaseous): Secondary | ICD-10-CM | POA: Diagnosis not present

## 2016-05-18 DIAGNOSIS — R262 Difficulty in walking, not elsewhere classified: Secondary | ICD-10-CM

## 2016-05-18 DIAGNOSIS — C162 Malignant neoplasm of body of stomach: Secondary | ICD-10-CM | POA: Diagnosis not present

## 2016-05-18 DIAGNOSIS — N390 Urinary tract infection, site not specified: Secondary | ICD-10-CM | POA: Diagnosis not present

## 2016-05-18 DIAGNOSIS — I4891 Unspecified atrial fibrillation: Secondary | ICD-10-CM | POA: Diagnosis not present

## 2016-05-18 DIAGNOSIS — K660 Peritoneal adhesions (postprocedural) (postinfection): Secondary | ICD-10-CM | POA: Diagnosis not present

## 2016-05-18 DIAGNOSIS — K209 Esophagitis, unspecified without bleeding: Secondary | ICD-10-CM | POA: Diagnosis present

## 2016-05-18 DIAGNOSIS — Z0389 Encounter for observation for other suspected diseases and conditions ruled out: Secondary | ICD-10-CM | POA: Diagnosis not present

## 2016-05-18 DIAGNOSIS — I10 Essential (primary) hypertension: Secondary | ICD-10-CM | POA: Diagnosis not present

## 2016-05-18 DIAGNOSIS — Z934 Other artificial openings of gastrointestinal tract status: Secondary | ICD-10-CM | POA: Diagnosis not present

## 2016-05-18 DIAGNOSIS — I2510 Atherosclerotic heart disease of native coronary artery without angina pectoris: Secondary | ICD-10-CM | POA: Diagnosis not present

## 2016-05-18 DIAGNOSIS — Z9889 Other specified postprocedural states: Secondary | ICD-10-CM

## 2016-05-18 HISTORY — PX: GASTROJEJUNOSTOMY: SHX1697

## 2016-05-18 HISTORY — PX: WHIPPLE PROCEDURE: SHX2667

## 2016-05-18 HISTORY — PX: LYSIS OF ADHESION: SHX5961

## 2016-05-18 LAB — CBC
HEMATOCRIT: 34.4 % — AB (ref 36.0–46.0)
HEMOGLOBIN: 11.6 g/dL — AB (ref 12.0–15.0)
MCH: 31 pg (ref 26.0–34.0)
MCHC: 33.7 g/dL (ref 30.0–36.0)
MCV: 92 fL (ref 78.0–100.0)
Platelets: 238 10*3/uL (ref 150–400)
RBC: 3.74 MIL/uL — AB (ref 3.87–5.11)
RDW: 14.3 % (ref 11.5–15.5)
WBC: 16.9 10*3/uL — ABNORMAL HIGH (ref 4.0–10.5)

## 2016-05-18 LAB — PROTIME-INR
INR: 1.02
PROTHROMBIN TIME: 13.4 s (ref 11.4–15.2)

## 2016-05-18 LAB — MRSA PCR SCREENING: MRSA by PCR: NEGATIVE

## 2016-05-18 LAB — CREATININE, SERUM
CREATININE: 0.79 mg/dL (ref 0.44–1.00)
GFR calc Af Amer: >60 mL/min (ref >60)

## 2016-05-18 SURGERY — WHIPPLE PROCEDURE
Anesthesia: General | Site: Abdomen

## 2016-05-18 MED ORDER — SUGAMMADEX SODIUM 200 MG/2ML IV SOLN
INTRAVENOUS | Status: DC | PRN
  Administered 2016-05-18: 120 mg via INTRAVENOUS

## 2016-05-18 MED ORDER — MORPHINE SULFATE 2 MG/ML IV SOLN
INTRAVENOUS | Status: AC
  Filled 2016-05-18: qty 25

## 2016-05-18 MED ORDER — BUPIVACAINE ON-Q PAIN PUMP (FOR ORDER SET NO CHG)
INJECTION | Status: AC
  Filled 2016-05-18: qty 1

## 2016-05-18 MED ORDER — MORPHINE SULFATE 2 MG/ML IV SOLN
INTRAVENOUS | Status: DC
  Administered 2016-05-18: 1 mg via INTRAVENOUS
  Administered 2016-05-18: 2 mg via INTRAVENOUS
  Administered 2016-05-18: 11:00:00 via INTRAVENOUS
  Administered 2016-05-19: 1 mg via INTRAVENOUS
  Administered 2016-05-19: 0 mg via INTRAVENOUS
  Administered 2016-05-19: 2 mg via INTRAVENOUS
  Administered 2016-05-20: 0 mg via INTRAVENOUS
  Administered 2016-05-20 (×2): 2 mg via INTRAVENOUS
  Administered 2016-05-20: 0 mg via INTRAVENOUS

## 2016-05-18 MED ORDER — NITROGLYCERIN 0.4 MG SL SUBL: 0.4000 mg | SUBLINGUAL_TABLET | SUBLINGUAL | Status: DC | PRN

## 2016-05-18 MED ORDER — ESMOLOL HCL 100 MG/10ML IV SOLN
INTRAVENOUS | Status: DC | PRN
  Administered 2016-05-18: 30 mg via INTRAVENOUS
  Administered 2016-05-18: 50 mg via INTRAVENOUS

## 2016-05-18 MED ORDER — BUPIVACAINE HCL (PF) 0.25 % IJ SOLN
INTRAMUSCULAR | Status: AC
  Filled 2016-05-18: qty 30

## 2016-05-18 MED ORDER — DILTIAZEM HCL 100 MG IV SOLR
5.0000 mg/h | INTRAVENOUS | Status: DC
  Administered 2016-05-18 – 2016-05-19 (×5): 10 mg/h via INTRAVENOUS
  Administered 2016-05-20 (×2): 15 mg/h via INTRAVENOUS
  Filled 2016-05-18 (×7): qty 100

## 2016-05-18 MED ORDER — 0.9 % SODIUM CHLORIDE (POUR BTL) OPTIME
TOPICAL | Status: DC | PRN
  Administered 2016-05-18 (×3): 1000 mL

## 2016-05-18 MED ORDER — SUGAMMADEX SODIUM 200 MG/2ML IV SOLN
INTRAVENOUS | Status: AC
  Filled 2016-05-18: qty 2

## 2016-05-18 MED ORDER — NALOXONE HCL 0.4 MG/ML IJ SOLN: 0.4000 mg | INTRAMUSCULAR | Status: DC | PRN

## 2016-05-18 MED ORDER — ONDANSETRON HCL 4 MG/2ML IJ SOLN: 4.0000 mg | Freq: Four times a day (QID) | INTRAMUSCULAR | Status: DC | PRN

## 2016-05-18 MED ORDER — FUROSEMIDE 10 MG/ML IJ SOLN
20.0000 mg | Freq: Every day | INTRAMUSCULAR | Status: DC
  Administered 2016-05-18 – 2016-05-21 (×4): 20 mg via INTRAVENOUS
  Filled 2016-05-18 (×4): qty 2

## 2016-05-18 MED ORDER — LEVOTHYROXINE SODIUM 100 MCG IV SOLR
50.0000 ug | Freq: Every day | INTRAVENOUS | Status: DC
  Administered 2016-05-18 – 2016-05-20 (×3): 50 ug via INTRAVENOUS
  Filled 2016-05-18 (×3): qty 5

## 2016-05-18 MED ORDER — HYDROMORPHONE HCL 1 MG/ML IJ SOLN
0.2500 mg | INTRAMUSCULAR | Status: DC | PRN
Start: 2016-05-18 — End: 2016-05-18

## 2016-05-18 MED ORDER — CEFAZOLIN SODIUM-DEXTROSE 2-4 GM/100ML-% IV SOLN
2.0000 g | Freq: Three times a day (TID) | INTRAVENOUS | Status: AC
  Administered 2016-05-18: 2 g via INTRAVENOUS
  Filled 2016-05-18 (×2): qty 100

## 2016-05-18 MED ORDER — ACETAMINOPHEN 10 MG/ML IV SOLN
1000.0000 mg | Freq: Four times a day (QID) | INTRAVENOUS | Status: AC
Start: 2016-05-18 — End: 2016-05-19
  Administered 2016-05-18 – 2016-05-19 (×3): 1000 mg via INTRAVENOUS
  Filled 2016-05-18 (×4): qty 100

## 2016-05-18 MED ORDER — ROCURONIUM BROMIDE 10 MG/ML (PF) SYRINGE
PREFILLED_SYRINGE | INTRAVENOUS | Status: AC
  Filled 2016-05-18: qty 10

## 2016-05-18 MED ORDER — ORAL CARE MOUTH RINSE
15.0000 mL | Freq: Two times a day (BID) | OROMUCOSAL | Status: DC
  Administered 2016-05-18 – 2016-05-28 (×11): 15 mL via OROMUCOSAL

## 2016-05-18 MED ORDER — LIDOCAINE 2% (20 MG/ML) 5 ML SYRINGE
INTRAMUSCULAR | Status: AC
  Filled 2016-05-18: qty 5

## 2016-05-18 MED ORDER — SODIUM CHLORIDE 0.9% FLUSH: 9.0000 mL | INTRAVENOUS | Status: DC | PRN

## 2016-05-18 MED ORDER — DILTIAZEM HCL 100 MG IV SOLR
5.0000 mg/h | INTRAVENOUS | Status: DC
  Administered 2016-05-18: 5 mg/h via INTRAVENOUS
  Filled 2016-05-18: qty 100

## 2016-05-18 MED ORDER — LIDOCAINE HCL (CARDIAC) 20 MG/ML IV SOLN
INTRAVENOUS | Status: DC | PRN
  Administered 2016-05-18: 60 mg via INTRAVENOUS

## 2016-05-18 MED ORDER — SUCCINYLCHOLINE CHLORIDE 20 MG/ML IJ SOLN
INTRAMUSCULAR | Status: DC | PRN
  Administered 2016-05-18: 100 mg via INTRAVENOUS

## 2016-05-18 MED ORDER — MORPHINE SULFATE (PF) 2 MG/ML IV SOLN: 1.0000 mg | INTRAVENOUS | Status: DC | PRN

## 2016-05-18 MED ORDER — ONDANSETRON HCL 4 MG/2ML IJ SOLN
4.0000 mg | Freq: Four times a day (QID) | INTRAMUSCULAR | Status: DC | PRN
  Administered 2016-05-18 – 2016-05-31 (×13): 4 mg via INTRAVENOUS
  Filled 2016-05-18 (×14): qty 2

## 2016-05-18 MED ORDER — BUPIVACAINE HCL (PF) 0.25 % IJ SOLN
INTRAMUSCULAR | Status: DC | PRN
  Administered 2016-05-18: 10 mL

## 2016-05-18 MED ORDER — METOPROLOL TARTRATE 5 MG/5ML IV SOLN
5.0000 mg | Freq: Four times a day (QID) | INTRAVENOUS | Status: DC
  Administered 2016-05-18 – 2016-05-19 (×4): 5 mg via INTRAVENOUS
  Filled 2016-05-18 (×4): qty 5

## 2016-05-18 MED ORDER — FENTANYL CITRATE (PF) 100 MCG/2ML IJ SOLN
INTRAMUSCULAR | Status: DC | PRN
  Administered 2016-05-18 (×12): 50 ug via INTRAVENOUS

## 2016-05-18 MED ORDER — METOPROLOL TARTRATE 5 MG/5ML IV SOLN
INTRAVENOUS | Status: AC
  Filled 2016-05-18: qty 5

## 2016-05-18 MED ORDER — KCL IN DEXTROSE-NACL 20-5-0.45 MEQ/L-%-% IV SOLN
INTRAVENOUS | Status: DC
  Administered 2016-05-18 – 2016-05-20 (×3): via INTRAVENOUS
  Administered 2016-05-21: 50 mL/h via INTRAVENOUS
  Administered 2016-05-22 – 2016-05-28 (×6): via INTRAVENOUS
  Filled 2016-05-18 (×17): qty 1000

## 2016-05-18 MED ORDER — METOPROLOL TARTRATE 5 MG/5ML IV SOLN
INTRAVENOUS | Status: DC | PRN
  Administered 2016-05-18 (×2): 2 mg via INTRAVENOUS

## 2016-05-18 MED ORDER — DIPHENHYDRAMINE HCL 50 MG/ML IJ SOLN: 12.5000 mg | Freq: Four times a day (QID) | INTRAMUSCULAR | Status: DC | PRN

## 2016-05-18 MED ORDER — FENTANYL CITRATE (PF) 100 MCG/2ML IJ SOLN
INTRAMUSCULAR | Status: AC
  Filled 2016-05-18: qty 4

## 2016-05-18 MED ORDER — PHENYLEPHRINE HCL 10 MG/ML IJ SOLN
INTRAMUSCULAR | Status: DC | PRN
  Administered 2016-05-18: 20 ug/min via INTRAVENOUS

## 2016-05-18 MED ORDER — ONDANSETRON HCL 4 MG/2ML IJ SOLN
INTRAMUSCULAR | Status: DC | PRN
  Administered 2016-05-18: 4 mg via INTRAVENOUS

## 2016-05-18 MED ORDER — BUPIVACAINE 0.25 % ON-Q PUMP DUAL CATH 300 ML
INJECTION | Status: DC | PRN
  Administered 2016-05-18: 300 mL

## 2016-05-18 MED ORDER — BUPIVACAINE 0.25 % ON-Q PUMP DUAL CATH 400 ML
400.0000 mL | INJECTION | Status: DC
Start: 2016-05-18 — End: 2016-05-21
  Filled 2016-05-18 (×2): qty 400

## 2016-05-18 MED ORDER — ACETAMINOPHEN 10 MG/ML IV SOLN
INTRAVENOUS | Status: AC
  Filled 2016-05-18: qty 100

## 2016-05-18 MED ORDER — ENOXAPARIN SODIUM 30 MG/0.3ML ~~LOC~~ SOLN
30.0000 mg | SUBCUTANEOUS | Status: DC
  Administered 2016-05-19 – 2016-05-28 (×10): 30 mg via SUBCUTANEOUS
  Filled 2016-05-18 (×10): qty 0.3

## 2016-05-18 MED ORDER — ROCURONIUM BROMIDE 100 MG/10ML IV SOLN
INTRAVENOUS | Status: DC | PRN
  Administered 2016-05-18: 50 mg via INTRAVENOUS
  Administered 2016-05-18: 30 mg via INTRAVENOUS

## 2016-05-18 MED ORDER — LACTATED RINGERS IV SOLN
INTRAVENOUS | Status: DC | PRN
  Administered 2016-05-18 (×2): via INTRAVENOUS

## 2016-05-18 MED ORDER — PROPOFOL 10 MG/ML IV BOLUS
INTRAVENOUS | Status: AC
  Filled 2016-05-18: qty 20

## 2016-05-18 MED ORDER — CHLORHEXIDINE GLUCONATE CLOTH 2 % EX PADS: 6.0000 | MEDICATED_PAD | Freq: Once | CUTANEOUS | Status: DC

## 2016-05-18 MED ORDER — DIPHENHYDRAMINE HCL 12.5 MG/5ML PO ELIX: 12.5000 mg | ORAL_SOLUTION | Freq: Four times a day (QID) | ORAL | Status: DC | PRN

## 2016-05-18 MED ORDER — DEXAMETHASONE SODIUM PHOSPHATE 10 MG/ML IJ SOLN
INTRAMUSCULAR | Status: DC | PRN
  Administered 2016-05-18: 10 mg via INTRAVENOUS

## 2016-05-18 MED ORDER — PROPOFOL 10 MG/ML IV BOLUS
INTRAVENOUS | Status: AC
  Filled 2016-05-18: qty 40

## 2016-05-18 MED ORDER — SCOPOLAMINE 1 MG/3DAYS TD PT72
1.0000 | MEDICATED_PATCH | TRANSDERMAL | Status: DC
  Administered 2016-05-18 – 2016-05-30 (×5): 1.5 mg via TRANSDERMAL
  Filled 2016-05-18 (×5): qty 1

## 2016-05-18 MED ORDER — PHENYLEPHRINE HCL 10 MG/ML IJ SOLN
INTRAMUSCULAR | Status: DC | PRN
  Administered 2016-05-18 (×2): 80 ug via INTRAVENOUS

## 2016-05-18 MED ORDER — LACTATED RINGERS IV SOLN
INTRAVENOUS | Status: DC | PRN
  Administered 2016-05-18: 07:00:00 via INTRAVENOUS

## 2016-05-18 MED ORDER — METHOCARBAMOL 1000 MG/10ML IJ SOLN
500.0000 mg | Freq: Three times a day (TID) | INTRAMUSCULAR | Status: DC | PRN
  Filled 2016-05-18: qty 5

## 2016-05-18 MED ORDER — LIDOCAINE 2% (20 MG/ML) 5 ML SYRINGE
INTRAMUSCULAR | Status: AC
Start: 2016-05-18 — End: 2016-05-18
  Filled 2016-05-18: qty 5

## 2016-05-18 MED ORDER — ACETAMINOPHEN 10 MG/ML IV SOLN
INTRAVENOUS | Status: DC | PRN
  Administered 2016-05-18: 1000 mg via INTRAVENOUS

## 2016-05-18 MED ORDER — FAMOTIDINE IN NACL 20-0.9 MG/50ML-% IV SOLN
20.0000 mg | Freq: Two times a day (BID) | INTRAVENOUS | Status: DC
  Administered 2016-05-18 – 2016-05-20 (×5): 20 mg via INTRAVENOUS
  Filled 2016-05-18 (×5): qty 50

## 2016-05-18 MED ORDER — HYDRALAZINE HCL 20 MG/ML IJ SOLN
10.0000 mg | INTRAMUSCULAR | Status: DC | PRN
  Administered 2016-05-24 – 2016-05-30 (×2): 10 mg via INTRAVENOUS
  Filled 2016-05-18 (×2): qty 1

## 2016-05-18 MED ORDER — ONDANSETRON 4 MG PO TBDP
4.0000 mg | ORAL_TABLET | Freq: Four times a day (QID) | ORAL | Status: DC | PRN
  Filled 2016-05-18: qty 1

## 2016-05-18 MED ORDER — SUCCINYLCHOLINE CHLORIDE 200 MG/10ML IV SOSY
PREFILLED_SYRINGE | INTRAVENOUS | Status: AC
  Filled 2016-05-18: qty 10

## 2016-05-18 MED ORDER — PROPOFOL 10 MG/ML IV BOLUS
INTRAVENOUS | Status: DC | PRN
  Administered 2016-05-18: 100 mg via INTRAVENOUS

## 2016-05-18 SURGICAL SUPPLY — 102 items
BAG BILE T-TUBES STRL (MISCELLANEOUS) ×5 IMPLANT
BLADE SURG 11 STRL SS (BLADE) ×5 IMPLANT
BLADE SURG ROTATE 9660 (MISCELLANEOUS) IMPLANT
BOOT SUTURE AID YELLOW STND (SUTURE) IMPLANT
CANISTER SUCTION 2500CC (MISCELLANEOUS) ×5 IMPLANT
CATH KIT ON Q 7.5IN SLV (PAIN MANAGEMENT) ×10 IMPLANT
CATH ROBINSON RED A/P 16FR (CATHETERS) IMPLANT
CHLORAPREP W/TINT 26ML (MISCELLANEOUS) ×5 IMPLANT
CLIP LIGATING HEM O LOK PURPLE (MISCELLANEOUS) IMPLANT
CLIP LIGATING HEMO O LOK GREEN (MISCELLANEOUS) IMPLANT
CLIP LIGATING HEMOLOK MED (MISCELLANEOUS) IMPLANT
CLIP TI LARGE 6 (CLIP) IMPLANT
CLIP TI MEDIUM 24 (CLIP) IMPLANT
CONT SPEC 4OZ CLIKSEAL STRL BL (MISCELLANEOUS) ×5 IMPLANT
COVER MAYO STAND STRL (DRAPES) ×5 IMPLANT
COVER SURGICAL LIGHT HANDLE (MISCELLANEOUS) ×5 IMPLANT
DRAIN CHANNEL 19F RND (DRAIN) ×10 IMPLANT
DRAIN PENROSE 1/2X36 STERILE (WOUND CARE) IMPLANT
DRAPE LAPAROSCOPIC ABDOMINAL (DRAPES) ×5 IMPLANT
DRAPE WARM FLUID 44X44 (DRAPE) ×5 IMPLANT
DRSG COVADERM 4X10 (GAUZE/BANDAGES/DRESSINGS) IMPLANT
DRSG COVADERM 4X14 (GAUZE/BANDAGES/DRESSINGS) ×5 IMPLANT
DRSG COVADERM 4X6 (GAUZE/BANDAGES/DRESSINGS) IMPLANT
DRSG COVADERM 4X8 (GAUZE/BANDAGES/DRESSINGS) IMPLANT
DRSG TELFA 3X8 NADH (GAUZE/BANDAGES/DRESSINGS) IMPLANT
ELECT BLADE 4.0 EZ CLEAN MEGAD (MISCELLANEOUS) ×5
ELECT BLADE 6.5 EXT (BLADE) ×5 IMPLANT
ELECT CAUTERY BLADE 6.4 (BLADE) ×5 IMPLANT
ELECT REM PT RETURN 9FT ADLT (ELECTROSURGICAL) ×5
ELECTRODE BLDE 4.0 EZ CLN MEGD (MISCELLANEOUS) ×3 IMPLANT
ELECTRODE REM PT RTRN 9FT ADLT (ELECTROSURGICAL) ×3 IMPLANT
GAUZE SPONGE 4X4 12PLY STRL (GAUZE/BANDAGES/DRESSINGS) IMPLANT
GAUZE SPONGE 4X4 16PLY XRAY LF (GAUZE/BANDAGES/DRESSINGS) IMPLANT
GEL ULTRASOUND 20GR AQUASONIC (MISCELLANEOUS) ×5 IMPLANT
GLOVE BIO SURGEON STRL SZ 6 (GLOVE) ×10 IMPLANT
GLOVE BIO SURGEON STRL SZ7.5 (GLOVE) ×5 IMPLANT
GLOVE BIOGEL PI IND STRL 7.0 (GLOVE) ×9 IMPLANT
GLOVE BIOGEL PI IND STRL 8 (GLOVE) ×3 IMPLANT
GLOVE BIOGEL PI INDICATOR 7.0 (GLOVE) ×6
GLOVE BIOGEL PI INDICATOR 8 (GLOVE) ×2
GLOVE INDICATOR 6.5 STRL GRN (GLOVE) ×10 IMPLANT
GOWN STRL REUS W/ TWL LRG LVL3 (GOWN DISPOSABLE) ×6 IMPLANT
GOWN STRL REUS W/ TWL XL LVL3 (GOWN DISPOSABLE) ×6 IMPLANT
GOWN STRL REUS W/TWL 2XL LVL3 (GOWN DISPOSABLE) ×10 IMPLANT
GOWN STRL REUS W/TWL LRG LVL3 (GOWN DISPOSABLE) ×4
GOWN STRL REUS W/TWL XL LVL3 (GOWN DISPOSABLE) ×4
HEMOSTAT SURGICEL 2X14 (HEMOSTASIS) IMPLANT
KIT BASIN OR (CUSTOM PROCEDURE TRAY) ×5 IMPLANT
KIT MARKER MARGIN INK (KITS) ×5 IMPLANT
KIT ROOM TURNOVER OR (KITS) ×5 IMPLANT
KIT TOURNIQUET VASCULAR (KITS) IMPLANT
LOOP VESSEL MAXI BLUE (MISCELLANEOUS) IMPLANT
NEEDLE BIOPSY 14X6 SOFT TISS (NEEDLE) IMPLANT
NS IRRIG 1000ML POUR BTL (IV SOLUTION) ×10 IMPLANT
PACK GENERAL/GYN (CUSTOM PROCEDURE TRAY) ×5 IMPLANT
PAD ARMBOARD 7.5X6 YLW CONV (MISCELLANEOUS) ×10 IMPLANT
PAD SHARPS MAGNETIC DISPOSAL (MISCELLANEOUS) IMPLANT
PLUG CATH AND CAP STER (CATHETERS) IMPLANT
RELOAD PROXIMATE 75MM BLUE (ENDOMECHANICALS) ×15 IMPLANT
RELOAD PROXIMATE 75MM GREEN (ENDOMECHANICALS) IMPLANT
SEPRAFILM PROCEDURAL PACK 3X5 (MISCELLANEOUS) ×5 IMPLANT
SHEARS FOC LG CVD HARMONIC 17C (MISCELLANEOUS) ×5 IMPLANT
SPONGE INTESTINAL PEANUT (DISPOSABLE) IMPLANT
SPONGE LAP 18X18 X RAY DECT (DISPOSABLE) ×10 IMPLANT
SPONGE SURGIFOAM ABS GEL 100 (HEMOSTASIS) IMPLANT
STAPLER PROXIMATE 75MM BLUE (STAPLE) ×5 IMPLANT
STAPLER VISISTAT 35W (STAPLE) ×5 IMPLANT
SUCTION POOLE TIP (SUCTIONS) ×5 IMPLANT
SUT 5.0 PDS RB-1 (SUTURE) ×10
SUT ETHILON 2 0 FS 18 (SUTURE) ×10 IMPLANT
SUT ETHILON 2 LR (SUTURE) IMPLANT
SUT PDS AB 1 TP1 96 (SUTURE) ×15 IMPLANT
SUT PDS AB 3-0 SH 27 (SUTURE) ×30 IMPLANT
SUT PDS AB 4-0 RB1 27 (SUTURE) ×55 IMPLANT
SUT PDS PLUS AB 5-0 RB-1 (SUTURE) ×15 IMPLANT
SUT PROLENE 3 0 SH 48 (SUTURE) ×20 IMPLANT
SUT PROLENE 4 0 RB 1 (SUTURE) ×4
SUT PROLENE 4 0 SH DA (SUTURE) ×10 IMPLANT
SUT PROLENE 4-0 RB1 .5 CRCL 36 (SUTURE) ×6 IMPLANT
SUT PROLENE 5 0 RB 1 DA (SUTURE) ×10 IMPLANT
SUT SILK 2 0 SH CR/8 (SUTURE) ×5 IMPLANT
SUT SILK 2 0 TIES 10X30 (SUTURE) ×5 IMPLANT
SUT SILK 3 0 SH CR/8 (SUTURE) ×10 IMPLANT
SUT SILK 3 0 TIES 10X30 (SUTURE) ×5 IMPLANT
SUT VIC AB 2-0 CT1 27 (SUTURE)
SUT VIC AB 2-0 CT1 TAPERPNT 27 (SUTURE) IMPLANT
SUT VIC AB 2-0 SH 18 (SUTURE) ×15 IMPLANT
SUT VIC AB 3-0 SH 18 (SUTURE) ×5 IMPLANT
SUT VIC AB 3-0 SH 27 (SUTURE) ×2
SUT VIC AB 3-0 SH 27X BRD (SUTURE) ×3 IMPLANT
SUT VIC AB 4-0 RB1 18 (SUTURE) ×5 IMPLANT
SUT VICRYL AB 2 0 TIES (SUTURE) IMPLANT
TAPE UMBILICAL 1/8 X36 TWILL (MISCELLANEOUS) IMPLANT
TOWEL OR 17X24 6PK STRL BLUE (TOWEL DISPOSABLE) ×5 IMPLANT
TOWEL OR 17X26 10 PK STRL BLUE (TOWEL DISPOSABLE) ×5 IMPLANT
TRAY FOLEY CATH 14FRSI W/METER (CATHETERS) ×5 IMPLANT
TUBE CONNECTING 12'X1/4 (SUCTIONS) ×1
TUBE CONNECTING 12X1/4 (SUCTIONS) ×4 IMPLANT
TUBE FEEDING 5FR 15 INCH (TUBING) IMPLANT
TUBE FEEDING 8FR 16IN STR KANG (MISCELLANEOUS) IMPLANT
TUNNELER SHEATH ON-Q 16GX12 DP (PAIN MANAGEMENT) ×5 IMPLANT
YANKAUER SUCT BULB TIP NO VENT (SUCTIONS) ×5 IMPLANT

## 2016-05-18 NOTE — Anesthesia Postprocedure Evaluation (Signed)
Anesthesia Post Note  Patient: [name]  Procedure(s) Performed: Procedure(s) (LRB): EXPLORATORY LAPAROTOMY (N/A) ROUX EN Y GASTROJEJUNOSTOMY (N/A) LYSIS OF ADHESION  Patient location during evaluation: PACU Anesthesia Type: General Level of consciousness: awake and alert Pain management: pain level controlled Vital Signs Assessment: post-procedure vital signs reviewed and stable Respiratory status: spontaneous breathing, nonlabored ventilation, respiratory function stable and patient connected to nasal cannula oxygen Cardiovascular status: blood pressure returned to baseline and stable Postop Assessment: no signs of nausea or vomiting Anesthetic complications: no    Last Vitals:  Vitals:   05/18/16 1115 05/18/16 1130  BP: (!) 174/73   Pulse: 78 80  Resp: 14 18  Temp:      Last Pain:  Vitals:   05/18/16 1130  TempSrc:   PainSc: 0-No pain    LLE Motor Response: Purposeful movement (05/18/16 1130)   RLE Motor Response: Purposeful movement (05/18/16 1130)        Emmalene Kattner,W. EDMOND

## 2016-05-18 NOTE — H&P (Signed)
  [name]  Location: [facility] Patient #: [id number] DOB: [birth year] Married / Language: English / Race: White Female   History of Present Illness  The patient is a 82 year old female who presents with gastric cancer. Patient is an 82 year old female that I saw while she was an inpatient at [facility]. She presented with an upper GI bleed. She was found to have a gastric cancer in the body of the stomach.   Pt went home in around 1-2 weeks post op from partial gastrectomy 01/15/2016. However, she had to be readmitted multiple times for frequent retching. She has horrible esophagitis. She has been on and off erythromycin, reglan, marinol, zofran, phenergan, compazine, carafate, and more. She eventually required J tube placement and has been able to tolerate tube feeds. Most recently, she went home 03/16/2016. She is getting everything possible in her J tube.  She continues to have retching. Dr. Carlean Purl repeated her endoscopy last week. She continues to have severe esophagitis. She continues to retch and have esophageal pain. After multiple episodes of retching, she will get to where she throws up bile.   pathology Diagnosis Stomach, resection for tumor INFILTRATIVE MODERATE TO POORLY DIFFERENTIATED ADENOCARCINOMA OF THE STOMACH (5.8 CM) THE TUMOR INVADES MUSCULARIS PROPRIA TUBULAR ADENOMA IS PRESENT ALL MARGINS OF RESECTION ARE NEGATIVE FOR CARCINOMA INTESTINAL METAPLASIA IS IDENTIFIED AND PRESENT AT THE PROXIMAL RESECTION MARGIN EIGHT BENIGN LYMPH NODES (0/8) GASTROINTESTINAL STROMAL TUMOR OF THE STOMACH (1.1 CM) PERITONEAL LEIOMYOMATOSIS (X 8)   Medication History Medications Reconciled    Review of Systems All other systems negative   Physical Exam General Note: looks weak.   Chest and Lung Exam Chest and lung exam reveals -quiet, even and easy respiratory effort with no use of accessory muscles. Inspection Chest Wall - Normal. Back  - normal.  Cardiovascular Cardiovascular examination reveals -normal pedal pulses bilaterally. Note: regular rate and rhythm  Abdomen Inspection-Inspection Normal. Palpation/Percussion Palpation and Percussion of the abdomen reveal - Soft, Non Tender, No Rebound tenderness, No Rigidity (guarding) and No hepatosplenomegaly. Note: Incision with some leaking hematoma. no evidence of infection.     Assessment & Plan ESOPHAGITIS (K20.9) Impression: Will stop prednisone, diflucan, and erythromycin. S/P GASTRECTOMY (Z90.3) Impression: Will plan reexploration and convert to roux en y gastrojejunostomy. Will also disconnect the pylorus. If this fails to resolve the problem, we may need to do a total gastrectomy with esophagojejunostomy. Would plan to keep the J tube and leave it alone.  Discussed risks and benefits.  We have tried and exhausted all other methods to try to fix this. Will plan this in next month or so.    Signed by Stark Klein, MD

## 2016-05-18 NOTE — Interval H&P Note (Signed)
History and Physical Interval Note:  05/18/2016 7:36 AM  [name]  has presented today for surgery, with the diagnosis of refractory esophagistis, following partial gastrectomy  The various methods of treatment have been discussed with the patient and family. After consideration of risks, benefits and other options for treatment, the patient has consented to  Procedure(s): EXPLORATORY LAPAROTOMY (N/A) POSSIBLE PYLOROMYOTOMY (N/A) ROUX EN Y GASTROJEJUNOSTOMY (N/A) POSSIBLE ESOPHAGOJEJUNOSTOMY (N/A) as a surgical intervention .  The patient's history has been reviewed, patient examined, no change in status, stable for surgery.  I have reviewed the patient's chart and labs.  Questions were answered to the patient's satisfaction.     BYERLY,FAERA

## 2016-05-18 NOTE — Anesthesia Preprocedure Evaluation (Addendum)
Anesthesia Evaluation  Patient identified by MRN, date of birth, ID band Patient awake    Reviewed: Allergy & Precautions, H&P , NPO status , Patient's Chart, lab work & pertinent test results  History of Anesthesia Complications (+) PONV  Airway Mallampati: II  TM Distance: >3 FB Neck ROM: Full    Dental no notable dental hx. (+) Edentulous Upper, Dental Advisory Given   Pulmonary neg pulmonary ROS,    Pulmonary exam normal breath sounds clear to auscultation       Cardiovascular hypertension, Pt. on medications and Pt. on home beta blockers + CAD and + Peripheral Vascular Disease  + dysrhythmias Atrial Fibrillation + pacemaker + Valvular Problems/Murmurs AS  Rhythm:Irregular Rate:Normal + Systolic murmurs    Neuro/Psych Anxiety CVA negative psych ROS   GI/Hepatic Neg liver ROS, PUD, GERD  Medicated,  Endo/Other  negative endocrine ROSHypothyroidism   Renal/GU Renal disease  negative genitourinary   Musculoskeletal  (+) Arthritis , Osteoarthritis,    Abdominal   Peds  Hematology negative hematology ROS (+)   Anesthesia Other Findings   Reproductive/Obstetrics negative OB ROS                            Anesthesia Physical Anesthesia Plan  ASA: III  Anesthesia Plan: General   Post-op Pain Management:    Induction: Intravenous  Airway Management Planned: Oral ETT  Additional Equipment: Arterial line  Intra-op Plan:   Post-operative Plan: Extubation in OR and Possible Post-op intubation/ventilation  Informed Consent: I have reviewed the patients History and Physical, chart, labs and discussed the procedure including the risks, benefits and alternatives for the proposed anesthesia with the patient or authorized representative who has indicated his/her understanding and acceptance.   Dental advisory given  Plan Discussed with: CRNA  Anesthesia Plan Comments:          Anesthesia Quick Evaluation

## 2016-05-18 NOTE — Progress Notes (Signed)
Dr. Ola Spurr aware of last dose lopressor last night 700 pm.  Patient unable to take pills with water - he stated was okay.

## 2016-05-18 NOTE — Progress Notes (Signed)
[facility]  Patient Status: Active (receiving services up to time of hospitalization)  [facility] is providing the following services: RN, PT, OT and ST  If patient discharges after hours, please call [phone].   [name] 05/18/2016, 2:49 PM

## 2016-05-18 NOTE — Transfer of Care (Signed)
Immediate Anesthesia Transfer of Care Note  Patient: [name]  Procedure(s) Performed: Procedure(s): EXPLORATORY LAPAROTOMY (N/A) ROUX EN Y GASTROJEJUNOSTOMY (N/A) LYSIS OF ADHESION  Patient Location: PACU  Anesthesia Type:General  Level of Consciousness: awake, alert  and patient cooperative  Airway & Oxygen Therapy: Patient Spontanous Breathing and Patient connected to nasal cannula oxygen  Post-op Assessment: Report given to RN and Post -op Vital signs reviewed and stable  Post vital signs: Reviewed and stable  Last Vitals:  Vitals:   05/18/16 0622 05/18/16 0631  BP: (!) 177/93   Pulse: (!) 102   Resp: 20   Temp:  36.4 C    Last Pain:  Vitals:   05/18/16 0631  TempSrc: Oral      Patients Stated Pain Goal: 3 ([id number])  Complications: No apparent anesthesia complications

## 2016-05-18 NOTE — Anesthesia Procedure Notes (Signed)
Procedure Name: Intubation Date/Time: 05/18/2016 7:54 AM Performed by: Salli Quarry Mohogany Toppins Pre-anesthesia Checklist: Patient identified, Emergency Drugs available, Suction available and Patient being monitored Patient Re-evaluated:Patient Re-evaluated prior to inductionOxygen Delivery Method: Circle System Utilized Preoxygenation: Pre-oxygenation with 100% oxygen Intubation Type: IV induction, Rapid sequence and Cricoid Pressure applied Laryngoscope Size: Mac and 3 Grade View: Grade I Tube type: Oral Tube size: 7.0 mm Number of attempts: 1 Airway Equipment and Method: Stylet Placement Confirmation: ETT inserted through vocal cords under direct vision,  positive ETCO2 and breath sounds checked- equal and bilateral Secured at: 21 cm Tube secured with: Tape Dental Injury: Teeth and Oropharynx as per pre-operative assessment

## 2016-05-18 NOTE — Op Note (Signed)
PRE-OPERATIVE DIAGNOSIS: severe esophagitis following partial gastrectomy and sleeve type reconstruction  POST-OPERATIVE DIAGNOSIS:  Same  PROCEDURE:  Procedure(s): Roux en Y gastrojejunostomy with disconnection of antrum from duodenum   SURGEON:  Surgeon(s): Stark Klein, MD  ASSISTANT: Jackolyn Confer, MD  ANESTHESIA:   local and general  DRAINS: Jejunostomy Tube staying in place.  LOCAL MEDICATIONS USED:  BUPIVICAINE   SPECIMEN:  No Specimen  DISPOSITION OF SPECIMEN:  N/A  COUNTS:  YES  DICTATION: .Dragon Dictation  PLAN OF CARE: Admit to inpatient   PATIENT DISPOSITION:  ICU - extubated and stable.  FINDINGS:  No evidence of recurrent cancer.   Unusual air pockets on wall of colon and in mesocolon.   EBL: 200 mL  PROCEDURE:  Pt was identified in the holding area and taken to the operating room where she was placed supine on operating room table.    General anesthesia was induced. Arms were tucked, a Foley catheter was placed, and her abdomen was prepped and draped in sterile fashion. The J-tube was prepped into the field. A timeout was performed according to the surgical safety check list. When all was correct, we continued.  A vertical midline incision was made in the prior scar. This continued to just below the umbilicus. The subcutaneous tissues were divided with the cautery. The fascia was entered in the superior midline over the liver. The fascia was incised the remainder of the skin incision taking care to protect underlying viscera. There were some omental adhesions to the abdominal wall. There were a few filmy colonic adhesions to the abdominal wall. The most difficult side of adhesions was underneath the liver to the stomach. These were taken down sharply. A Bookwalter self-retaining retractor was placed for assistance with visualization.  The small intestine was examined and the J-tube was in place and was not having trouble. This was approximately 25 cm past  the ligament of Treitz. Small intestine was examined and it was determined that the best place to create a Roux limb would be beyond the J-tube. The small intestine was stapled off and the mesentery was carefully divided with enough length to be able to pull the distal limb. There was already a defect on the left mesocolon and the distal limb of the small bowel was pulled through there. A stapled anastomosis was made to the gastric pouch.  The NGT was threaded into the efferent roux limb.  The defect was closed with 3-0 PDS in running Siren fashion.    The jejunojejunostomy was created in linear stapled fashion preserving the peristaltic direction.  This defect was also closed with two 3-0 PDS in connell fashion.  The mesenteric defects were closed with 2-0 vicryl.    The OnQ tunneler sheaths were placed on either side of the incision in the preperitoneal space.  Seprafilm was placed under the liver and the fascia.  The fascia was closed with #1 looped running PDS suture.  The skin was irrigated and closed with staples.    Needle, sponge, and instrument counts were correct x 2.

## 2016-05-19 ENCOUNTER — Encounter (HOSPITAL_COMMUNITY): Payer: Self-pay | Admitting: General Surgery

## 2016-05-19 LAB — BASIC METABOLIC PANEL
Anion gap: 9 (ref 5–15)
BUN: 34 mg/dL — AB (ref 6–20)
CHLORIDE: 98 mmol/L — AB (ref 101–111)
CO2: 25 mmol/L (ref 22–32)
Calcium: 8.4 mg/dL — ABNORMAL LOW (ref 8.9–10.3)
Creatinine, Ser: 0.9 mg/dL (ref 0.44–1.00)
GFR calc Af Amer: >60 mL/min (ref >60)
GFR calc non Af Amer: 59 mL/min — ABNORMAL LOW (ref >60)
GLUCOSE: 168 mg/dL — AB (ref 65–99)
POTASSIUM: 3.6 mmol/L (ref 3.5–5.1)
Sodium: 132 mmol/L — ABNORMAL LOW (ref 135–145)

## 2016-05-19 LAB — CBC
HCT: 36.8 % (ref 36.0–46.0)
HEMOGLOBIN: 12.7 g/dL (ref 12.0–15.0)
MCH: 31.8 pg (ref 26.0–34.0)
MCHC: 34.5 g/dL (ref 30.0–36.0)
MCV: 92.2 fL (ref 78.0–100.0)
Platelets: 249 10*3/uL (ref 150–400)
RBC: 3.99 MIL/uL (ref 3.87–5.11)
RDW: 14.3 % (ref 11.5–15.5)
WBC: 19.3 10*3/uL — ABNORMAL HIGH (ref 4.0–10.5)

## 2016-05-19 LAB — PROTIME-INR
INR: 1.13
Prothrombin Time: 14.6 seconds (ref 11.4–15.2)

## 2016-05-19 LAB — MAGNESIUM: Magnesium: 1.5 mg/dL — ABNORMAL LOW (ref 1.7–2.4)

## 2016-05-19 LAB — PHOSPHORUS: Phosphorus: 4.1 mg/dL (ref 2.5–4.6)

## 2016-05-19 MED ORDER — METOPROLOL TARTRATE 5 MG/5ML IV SOLN
7.5000 mg | Freq: Four times a day (QID) | INTRAVENOUS | Status: DC
  Administered 2016-05-19 – 2016-05-20 (×5): 7.5 mg via INTRAVENOUS
  Filled 2016-05-19 (×5): qty 10

## 2016-05-19 NOTE — Care Management Note (Addendum)
Case Management Note  Patient Details  Name: TANI GRAYS MRN: [id number] Date of Birth: 02/16/1934  Subjective/Objective:      S/p exp lap, roux en y gastrojejunostomy and lysis of adhesion     - pt went into A fib during procedure placed on Cardizem drip.            Action/Plan:  PTA from home with daughters - pt uses 4 prong cane and walker in the home.   Pt is already active [facility] for [facility].  [facility] is providing the following services: RN, PT, OT and ST - agency is aware of admit.  Pt states she would like to resume services at discharge. CM will request resumption orders prior to discharge via physician sticky notes.     Expected Discharge Date:                  Expected Discharge Plan:  [place]  In-House Referral:     Discharge planning Services  CM Consult  Post Acute Care Choice:  Resumption of Svcs/PTA Provider Choice offered to:     DME Arranged:    DME Agency:     HH Arranged:    [place] Agency:     Status of Service:  In process, will continue to follow  If discussed at Long Length of Stay Meetings, dates discussed:    Additional Comments:  [name], RN 05/19/2016, 10:41 AM

## 2016-05-19 NOTE — Progress Notes (Signed)
1 Day Post-Op  Subjective: Pain controlled.  Only hit PCA twice.  No retching.    Objective: Vital signs in last 24 hours: Temp:  [97.3 F (36.3 C)-98.4 F (36.9 C)] 97.8 F (36.6 C) (09/13 0801) Pulse Rate:  [64-89] 82 (09/13 0700) Resp:  [11-23] 19 (09/13 0731) BP: (118-175)/(45-91) 155/78 (09/13 0700) SpO2:  [97 %-100 %] 100 % (09/13 0731) Arterial Line BP: (107-187)/(52-125) 170/60 (09/13 0700) Last BM Date: 05/18/16  Intake/Output from previous day: 09/12 0701 - 09/13 0700 In: 3883.5 [I.V.:3303.5; NG/GT:30; IV Piggyback:500] Out: 1035 [Urine:735; Blood:300] Intake/Output this shift: No intake/output data recorded.  General appearance: alert, cooperative and no distress Resp: breathing comfortably Cardio: regular rate and rhythm GI: soft, non distended, dressing c/d/i.    Lab Results:   Recent Labs  05/18/16 1403 05/19/16 0403  WBC 16.9* 19.3*  HGB 11.6* 12.7  HCT 34.4* 36.8  PLT 238 249   BMET  Recent Labs  05/18/16 1403 05/19/16 0403  NA  --  132*  K  --  3.6  CL  --  98*  CO2  --  25  GLUCOSE  --  168*  BUN  --  34*  CREATININE 0.79 0.90  CALCIUM  --  8.4*   PT/INR  Recent Labs  05/18/16 0621 05/19/16 0403  LABPROT 13.4 14.6  INR 1.02 1.13   ABG No results for input(s): PHART, HCO3 in the last 72 hours.  Invalid input(s): PCO2, PO2  Studies/Results: No results found.  Anti-infectives: Anti-infectives    Start     Dose/Rate Route Frequency Ordered Stop   05/18/16 1600  ceFAZolin (ANCEF) IVPB 2g/100 mL premix     2 g 200 mL/hr over 30 Minutes Intravenous Every 8 hours 05/18/16 1240 05/18/16 1730   05/18/16 0700  ceFAZolin (ANCEF) IVPB 2g/100 mL premix     2 g 200 mL/hr over 30 Minutes Intravenous To [facility]Stay Surgical 05/17/16 1418 05/18/16 0810      Assessment/Plan: s/p Procedure(s): EXPLORATORY LAPAROTOMY (N/A) ROUX EN Y GASTROJEJUNOSTOMY (N/A) LYSIS OF ADHESION d/c art line- not reading appropriately  Unclear cause of  foamy air bubbles along colon (see picture in media).  WBCs up to 19.  Will keep in ICU overnight to observe.    Also, pt was in atrial fibrillation most of OR case.  Converted with tug on stomach incidentally.  Observe.  IV dilt gtt.  Plan upper GI tomorrow and hope to pull NGT and start trickle feeds with PO meds.  Can then go back on oral diltiazem.    BP still a bit high with metoprolol iv and dilt gtt and IV lasix.  Increase metoprolol.    LOS: 1 day    [facility] 05/19/2016

## 2016-05-20 ENCOUNTER — Inpatient Hospital Stay (HOSPITAL_COMMUNITY): Payer: Medicare Other

## 2016-05-20 LAB — CBC
HCT: 33 % — ABNORMAL LOW (ref 36.0–46.0)
HEMOGLOBIN: 10.8 g/dL — AB (ref 12.0–15.0)
MCH: 30.7 pg (ref 26.0–34.0)
MCHC: 32.7 g/dL (ref 30.0–36.0)
MCV: 93.8 fL (ref 78.0–100.0)
Platelets: 225 10*3/uL (ref 150–400)
RBC: 3.52 MIL/uL — ABNORMAL LOW (ref 3.87–5.11)
RDW: 14.8 % (ref 11.5–15.5)
WBC: 16 10*3/uL — ABNORMAL HIGH (ref 4.0–10.5)

## 2016-05-20 LAB — BASIC METABOLIC PANEL
Anion gap: 6 (ref 5–15)
BUN: 23 mg/dL — AB (ref 6–20)
CHLORIDE: 101 mmol/L (ref 101–111)
CO2: 26 mmol/L (ref 22–32)
CREATININE: 0.77 mg/dL (ref 0.44–1.00)
Calcium: 8.6 mg/dL — ABNORMAL LOW (ref 8.9–10.3)
GFR calc Af Amer: >60 mL/min (ref >60)
GFR calc non Af Amer: >60 mL/min (ref >60)
GLUCOSE: 136 mg/dL — AB (ref 65–99)
Potassium: 4.6 mmol/L (ref 3.5–5.1)
SODIUM: 133 mmol/L — AB (ref 135–145)

## 2016-05-20 MED ORDER — METHOCARBAMOL 500 MG PO TABS
500.0000 mg | ORAL_TABLET | Freq: Three times a day (TID) | ORAL | Status: DC | PRN
Start: 2016-05-20 — End: 2016-05-31
  Administered 2016-05-22 – 2016-05-23 (×2): 500 mg via ORAL
  Filled 2016-05-20 (×2): qty 1

## 2016-05-20 MED ORDER — OXYCODONE HCL 5 MG/5ML PO SOLN
5.0000 mg | ORAL | Status: DC | PRN
  Administered 2016-05-21 – 2016-05-22 (×2): 5 mg
  Filled 2016-05-20 (×2): qty 5

## 2016-05-20 MED ORDER — ALPRAZOLAM 0.5 MG PO TABS: 0.5000 mg | ORAL_TABLET | Freq: Every evening | ORAL | Status: DC | PRN

## 2016-05-20 MED ORDER — DILTIAZEM 12 MG/ML ORAL SUSPENSION
45.0000 mg | Freq: Four times a day (QID) | ORAL | Status: DC
  Administered 2016-05-20 – 2016-05-27 (×27): 45 mg
  Filled 2016-05-20 (×28): qty 6

## 2016-05-20 MED ORDER — SIMETHICONE 80 MG PO CHEW
80.0000 mg | CHEWABLE_TABLET | Freq: Four times a day (QID) | ORAL | Status: DC
  Filled 2016-05-20: qty 1

## 2016-05-20 MED ORDER — PANTOPRAZOLE SODIUM 40 MG PO PACK
40.0000 mg | PACK | Freq: Every day | ORAL | Status: DC
  Administered 2016-05-20 – 2016-05-25 (×6): 40 mg
  Filled 2016-05-20 (×6): qty 20

## 2016-05-20 MED ORDER — ACETAMINOPHEN 325 MG PO TABS
325.0000 mg | ORAL_TABLET | Freq: Four times a day (QID) | ORAL | Status: DC | PRN
  Administered 2016-05-20 – 2016-05-31 (×17): 325 mg via ORAL
  Filled 2016-05-20 (×18): qty 1

## 2016-05-20 MED ORDER — PANTOPRAZOLE SODIUM 40 MG PO TBEC
40.0000 mg | DELAYED_RELEASE_TABLET | Freq: Every day | ORAL | Status: DC
  Filled 2016-05-20: qty 1

## 2016-05-20 MED ORDER — DILTIAZEM HCL 100 MG IV SOLR
5.0000 mg/h | INTRAVENOUS | Status: AC
  Filled 2016-05-20: qty 100

## 2016-05-20 MED ORDER — POTASSIUM CHLORIDE CRYS ER 20 MEQ PO TBCR
20.0000 meq | EXTENDED_RELEASE_TABLET | Freq: Every day | ORAL | Status: DC
  Filled 2016-05-20: qty 1

## 2016-05-20 MED ORDER — DILTIAZEM HCL 90 MG PO TABS
45.0000 mg | ORAL_TABLET | Freq: Four times a day (QID) | ORAL | Status: DC
  Filled 2016-05-20: qty 0.5

## 2016-05-20 MED ORDER — VITAL 1.5 CAL PO LIQD
1000.0000 mL | ORAL | Status: DC
  Administered 2016-05-20: 1000 mL
  Filled 2016-05-20: qty 1000

## 2016-05-20 MED ORDER — LEVOTHYROXINE SODIUM 50 MCG PO TABS
50.0000 ug | ORAL_TABLET | Freq: Every day | ORAL | Status: DC
  Administered 2016-05-21 – 2016-05-31 (×11): 50 ug via ORAL
  Filled 2016-05-20 (×11): qty 1

## 2016-05-20 MED ORDER — POTASSIUM CHLORIDE 20 MEQ/15ML (10%) PO SOLN
20.0000 meq | Freq: Every day | ORAL | Status: DC
  Administered 2016-05-20 – 2016-05-28 (×9): 20 meq via ORAL
  Filled 2016-05-20 (×9): qty 15

## 2016-05-20 MED ORDER — METOPROLOL TARTRATE 100 MG PO TABS
100.0000 mg | ORAL_TABLET | Freq: Two times a day (BID) | ORAL | Status: DC
  Administered 2016-05-20 – 2016-05-31 (×23): 100 mg via ORAL
  Filled 2016-05-20: qty 2
  Filled 2016-05-20 (×6): qty 1
  Filled 2016-05-20: qty 2
  Filled 2016-05-20 (×9): qty 1
  Filled 2016-05-20: qty 2
  Filled 2016-05-20 (×6): qty 1

## 2016-05-20 MED ORDER — SIMETHICONE 40 MG/0.6ML PO SUSP
80.0000 mg | Freq: Four times a day (QID) | ORAL | Status: DC
Start: 2016-05-20 — End: 2016-05-24
  Administered 2016-05-20 – 2016-05-23 (×14): 80 mg
  Filled 2016-05-20 (×18): qty 1.2

## 2016-05-20 MED ORDER — GABAPENTIN 250 MG/5ML PO SOLN
200.0000 mg | Freq: Three times a day (TID) | ORAL | Status: DC
  Administered 2016-05-20 – 2016-05-28 (×24): 200 mg
  Filled 2016-05-20 (×26): qty 4

## 2016-05-20 MED ORDER — SIMETHICONE 80 MG PO CHEW: 125.0000 mg | CHEWABLE_TABLET | Freq: Three times a day (TID) | ORAL | Status: DC

## 2016-05-20 MED ORDER — IOPAMIDOL (ISOVUE-300) INJECTION 61%
INTRAVENOUS | Status: AC
  Administered 2016-05-20: 20 mL via ORAL
  Filled 2016-05-20: qty 150

## 2016-05-20 MED ORDER — IOPAMIDOL (ISOVUE-300) INJECTION 61%
150.0000 mL | Freq: Once | INTRAVENOUS | Status: AC | PRN
  Administered 2016-05-20: 20 mL via ORAL

## 2016-05-20 MED ORDER — TRAMADOL HCL 50 MG PO TABS
50.0000 mg | ORAL_TABLET | Freq: Four times a day (QID) | ORAL | Status: DC | PRN
  Administered 2016-05-22 – 2016-05-31 (×16): 50 mg via ORAL
  Filled 2016-05-20 (×17): qty 1

## 2016-05-20 MED ORDER — WARFARIN - PHARMACIST DOSING INPATIENT
Freq: Every day | Status: DC
  Administered 2016-05-20 – 2016-05-30 (×5)

## 2016-05-20 MED ORDER — FUROSEMIDE 40 MG PO TABS
40.0000 mg | ORAL_TABLET | Freq: Every day | ORAL | Status: DC
  Administered 2016-05-20 – 2016-05-31 (×12): 40 mg via ORAL
  Filled 2016-05-20 (×12): qty 1

## 2016-05-20 MED ORDER — WARFARIN SODIUM 3 MG PO TABS
3.0000 mg | ORAL_TABLET | Freq: Once | ORAL | Status: AC
  Administered 2016-05-20: 3 mg via ORAL
  Filled 2016-05-20: qty 1

## 2016-05-20 NOTE — Evaluation (Signed)
Physical Therapy Evaluation Patient Details Name: [name] MRN: [id number] DOB: 05-Jun-1934 Today's Date: 05/20/2016   History of Present Illness  The patient is a 82 year old female who presents with gastric cancer. [facility] since 01/15/16 when she was d/c's have a partial gastrectomy. Pt admitted on 9/12 and underwent Roux en Y gastrojejunostomy with disconnection of antrum from duodenum (whipple).  Clinical Impression  Pt admitted for above. Pt presenting with generalized weakness and fatigue, with increased abdominal pain wit mobility. Pt motivated and strongly desires to return home with family but will need to be at a minA level. Acute PT to con't to follow and progress as able.    Follow Up Recommendations Home health PT;Supervision/Assistance - 24 hour (pt may need SNF if she can't get to minA level)    Equipment Recommendations  None recommended by PT    Recommendations for Other Services       Precautions / Restrictions Precautions Precautions: Fall Precaution Comments: has multiple lines and tubes Restrictions Weight Bearing Restrictions: No      Mobility  Bed Mobility Overal bed mobility: Needs Assistance;+2 for physical assistance Bed Mobility: Rolling;Sidelying to Sit;Sit to Sidelying Rolling: Max assist Sidelying to sit: Max assist;+2 for physical assistance;+2 for safety/equipment     Sit to sidelying: Max assist;+2 for physical assistance;+2 for safety/equipment General bed mobility comments: pt held onto pillow, assist for LE management off bed and trunk elevation. pt did intiate rolling and transfer to EOB  Transfers Overall transfer level: Needs assistance Equipment used: Rolling walker (2 wheeled) Transfers: Sit to/from [facility] Sit to Stand: Mod assist;+2 safety/equipment Stand pivot transfers: Mod assist;+2 physical assistance       General transfer comment: pt able to push up and intiate  transfer, modA to maintain balance during transition of hands from bed to chair  Ambulation/Gait Ambulation/Gait assistance: Min assist;Mod assist;+2 safety/equipment Ambulation Distance (Feet): 25 Feet Assistive device: Rolling walker (2 wheeled) Gait Pattern/deviations: Step-to pattern;Decreased stride length;Wide base of support Gait velocity: slow Gait velocity interpretation: Below normal speed for age/gender General Gait Details: pt with abdominal pain and quick onset of fatigue  Stairs            Wheelchair Mobility    Modified Rankin (Stroke Patients Only)       Balance Overall balance assessment: Needs assistance Sitting-balance support: Feet supported;Bilateral upper extremity supported Sitting balance-Leahy Scale: Poor Sitting balance - Comments: due to abdominal pain   Standing balance support: Bilateral upper extremity supported Standing balance-Leahy Scale: Fair Standing balance comment: dependent on RW or external support                             Pertinent Vitals/Pain Pain Assessment: 0-10 Pain Score: 8  Pain Location: abdomen/surgical site with mvmt Pain Descriptors / Indicators: Sharp Pain Intervention(s): Monitored during session    Home Living Family/patient expects to be discharged to:: Private residence Living Arrangements: Children Available Help at Discharge: Family;Available 24 hours/day Type of Home: House Home Access: Stairs to enter Entrance Stairs-Rails: Left Entrance Stairs-Number of Steps: 1 Home Layout: Multi-level Home Equipment: Walker - 2 wheels;Cane - quad;Grab bars - toilet;Grab bars - tub/shower;Wheelchair - Insurance claims handler - 4 wheels;Bedside commode Additional Comments: pt stays on first floor and has a handicapped accessible bathroom    Prior Function Level of Independence: Independent with assistive device(s)         Comments: ambulating with  walker and even quad cane when feeling good      Hand Dominance   Dominant Hand: Right    Extremity/Trunk Assessment   Upper Extremity Assessment: Generalized weakness           Lower Extremity Assessment: Generalized weakness (R knee locked in extension surgically)      Cervical / Trunk Assessment: Normal  Communication   Communication: HOH  Cognition Arousal/Alertness: Awake/alert Behavior During Therapy: WFL for tasks assessed/performed Overall Cognitive Status: Within Functional Limits for tasks assessed                      General Comments General comments (skin integrity, edema, etc.): pt with multiple dressings to abdomen, RN assessing after ambulation    Exercises        Assessment/Plan    PT Assessment Patient needs continued PT services  PT Diagnosis Difficulty walking;Generalized weakness;Acute pain   PT Problem List Decreased strength;Decreased activity tolerance;Decreased balance;Decreased mobility;Decreased knowledge of use of DME;Decreased safety awareness;Pain  PT Treatment Interventions DME instruction;Gait training;Stair training;Functional mobility training;Therapeutic activities;Therapeutic exercise;Balance training;Patient/family education   PT Goals (Current goals can be found in the Care Plan section) Acute Rehab PT Goals Patient Stated Goal: home PT Goal Formulation: With patient Time For Goal Achievement: 05/27/16 Potential to Achieve Goals: Good    Frequency Min 3X/week   Barriers to discharge        Co-evaluation               End of Session   Activity Tolerance: Patient limited by fatigue Patient left: in bed;with call bell/phone within reach;with nursing/sitter in room Nurse Communication: Mobility status         Time: [id number] PT Time Calculation (min) (ACUTE ONLY): 21 min   Charges:   PT Evaluation $PT Eval Moderate Complexity: 1 Procedure     PT G CodesKingsley Callander 05/20/2016, 4:08 PM   Kittie Plater, PT, DPT Pager  #: [phone] Office #: [phone]

## 2016-05-20 NOTE — Progress Notes (Addendum)
PCA Morphine D/C per MD Byerly's order.  1mg  of Morphine has been used by patient in last 24hr.   34mg  of Morphine wasted with Dorene Grebe RN.   Inis Sizer

## 2016-05-20 NOTE — Progress Notes (Signed)
2 Days Post-Op  Subjective: Continues not to retch or spit up.  Pain controlled pretty well with tylenol and OnQ.  Has hit PCA a few times, but doesn't like morphine due to mild nausea.    Objective: Vital signs in last 24 hours: Temp:  [98.2 F (36.8 C)-99.6 F (37.6 C)] 98.2 F (36.8 C) (09/14 0807) Pulse Rate:  [79-146] 83 (09/14 1000) Resp:  [13-26] 25 (09/14 1000) BP: (113-171)/(60-92) 138/74 (09/14 1000) SpO2:  [93 %-98 %] 95 % (09/14 1000) Last BM Date: 05/18/16  Intake/Output from previous day: 09/13 0701 - 09/14 0700 In: 2910 [I.V.:2540; NG/GT:120; IV Piggyback:100] Out: 985 [Urine:985] Intake/Output this shift: Total I/O In: 310 [I.V.:230; Other:50; NG/GT:30] Out: 75 [Urine:75]  General appearance: alert, cooperative and no distress Resp: breathing comfortably Cardio: regular rate and rhythm GI: soft, non distended, dressing c/d/i.   OnQ in place.    Lab Results:   Recent Labs  05/19/16 0403 05/20/16 0407  WBC 19.3* 16.0*  HGB 12.7 10.8*  HCT 36.8 33.0*  PLT 249 225   BMET  Recent Labs  05/19/16 0403 05/20/16 0407  NA 132* 133*  K 3.6 4.6  CL 98* 101  CO2 25 26  GLUCOSE 168* 136*  BUN 34* 23*  CREATININE 0.90 0.77  CALCIUM 8.4* 8.6*   PT/INR  Recent Labs  05/18/16 0621 05/19/16 0403  LABPROT 13.4 14.6  INR 1.02 1.13   ABG No results for input(s): PHART, HCO3 in the last 72 hours.  Invalid input(s): PCO2, PO2  Studies/Results: No results found.  Anti-infectives: Anti-infectives    Start     Dose/Rate Route Frequency Ordered Stop   05/18/16 1600  ceFAZolin (ANCEF) IVPB 2g/100 mL premix     2 g 200 mL/hr over 30 Minutes Intravenous Every 8 hours 05/18/16 1240 05/18/16 1730   05/18/16 0700  ceFAZolin (ANCEF) IVPB 2g/100 mL premix     2 g 200 mL/hr over 30 Minutes Intravenous To [facility]Stay Surgical 05/17/16 1418 05/18/16 0810      Assessment/Plan: s/p Procedure(s): EXPLORATORY LAPAROTOMY (N/A) ROUX EN Y GASTROJEJUNOSTOMY  (N/A) LYSIS OF ADHESION  Unclear cause of foamy air bubbles along colon (see picture in media).  WBCs up to 19.    Atrial fibrillation- went back in to afib with RVR last night at 2 am with HR 146.    HTN - improved a bit with increased dose metoprolol.  Upper GI today. If no leak or obstruction will d/c NGT and start PO/per J tube meds including oxycodone elixir and oral diltiazem.  Will also plan trickle feeds.  If no leak and no RVR, will transfer to stepdown or telemetry.    Plan removal of foley today as well after study done.   LOS: 2 days    [name] 9/14/2017Patient ID: [name], female   DOB: [birth year], 82 y.o.   MRN: [id number]

## 2016-05-20 NOTE — Progress Notes (Signed)
ANTICOAGULATION CONSULT NOTE - Initial Consult  Pharmacy Consult for Coumadin Indication: atrial fibrillation  Allergies  Allergen Reactions  . Amiodarone Other (See Comments)     Thyroid and liver and kidney problems  . Codeine Nausea And Vomiting  . Hydrocodone Other (See Comments)    hallucination  . Morphine And Related Nausea And Vomiting  . Requip [Ropinirole Hcl] Other (See Comments)    Headache   . Zinc Nausea Only    nausea  . Reglan [Metoclopramide] Hives, Itching and Rash    Made face red, also    Patient Measurements: Height: 5\' 1"  (154.9 cm) Weight: 143 lb 4.8 oz (65 kg) IBW/kg (Calculated) : 47.8  Vital Signs: Temp: 98.2 F (36.8 C) (09/14 1224) Temp Source: Oral (09/14 1224) BP: 148/132 (09/14 1100) Pulse Rate: 72 (09/14 1300)  Labs:  Recent Labs  05/18/16 0621  05/18/16 1403 05/19/16 0403 05/20/16 0407  HGB  --   < > 11.6* 12.7 10.8*  HCT  --   --  34.4* 36.8 33.0*  PLT  --   --  238 249 225  LABPROT 13.4  --   --  14.6  --   INR 1.02  --   --  1.13  --   CREATININE  --   --  0.79 0.90 0.77  < > = values in this interval not displayed.  Estimated Creatinine Clearance: 48.4 mL/min (by C-G formula based on SCr of 0.77 mg/dL).   Medical History: Past Medical History:  Diagnosis Date  . Adenocarcinoma of stomach ([place]) 11/11/2015   gastric mass on egd  . Anal fissure   . Anemia    hx 3/17 iron infusion, on aranesp and injected B12 since 11/2015.   [name] Anxiety   . Aortic root dilatation ([place])   . Arthritis   . 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 CAD (coronary artery disease)   . Cancer ([place])    hx Kidney cancer / hx of endometrial cancer  . CHF (congestive heart failure) ([place])   . Chronic kidney disease    only has one kidney rt lft rem 03 ca     dr. Risa Grill  . Elevated LFTs    hepatic steatosis  . Gastroparesis   . GERD (gastroesophageal reflux disease)   . History of uterine cancer 1980s   treated with hysterectomy, external radiation and  radiation seed implants.   [name] HTN (hypertension)   . Hyperlipemia   . Hypothyroidism   . Moderate aortic stenosis   . Neuromuscular disorder (H[facility])    neuropathy in right leg  . Neuropathy ([place])    feet/legs  . No blood products 02/27/2016  . Osteomyelitis ([place]) as child  . PAF (paroxysmal atrial fibrillation) ([place])   . PONV (postoperative nausea and vomiting)   . Presence of permanent cardiac pacemaker   . Radiation proctitis   . Restless leg syndrome   . Stroke [facility]) 15   partial stroke left legally blind  . Tachycardia-bradycardia syndrome [facility])    s/p PPM by Dr Doreatha Lew (MDT) 07/03/10   Assessment:   82 yr old female on Coumadin prior to admission for afib and hx stroke.   Held for surgery on 9/12, to resume today.  No evidence of leak or obstruction on upper GI, cleared for meds PO or per tube.    Lovenox 30 mg sq q24hrs begun on 9/13.  INR 1.13 on 9/13.    Home Coumadin regimen:  2 mg daily.  Last dose 9/6.  Goal of Therapy:  INR 2-3 Monitor platelets by anticoagulation protocol: Yes   Plan:   Coumadin 3 mg PO x 1 today.   She would like to take it In applesauce, but not crushed, when allowed.  Daily PT/INR.  On Lovenox 30 mg sq q24hrs.  Stop when INR >2.  Arty Baumgartner, [place] Pager: [phone] 05/20/2016,2:59 PM

## 2016-05-21 LAB — BASIC METABOLIC PANEL
ANION GAP: 7 (ref 5–15)
BUN: 19 mg/dL (ref 6–20)
CALCIUM: 8.2 mg/dL — AB (ref 8.9–10.3)
CHLORIDE: 101 mmol/L (ref 101–111)
CO2: 23 mmol/L (ref 22–32)
CREATININE: 0.76 mg/dL (ref 0.44–1.00)
GFR calc Af Amer: >60 mL/min (ref >60)
GLUCOSE: 122 mg/dL — AB (ref 65–99)
POTASSIUM: 3.8 mmol/L (ref 3.5–5.1)
SODIUM: 131 mmol/L — AB (ref 135–145)

## 2016-05-21 LAB — GLUCOSE, CAPILLARY
GLUCOSE-CAPILLARY: 152 mg/dL — AB (ref 65–99)
Glucose-Capillary: 122 mg/dL — ABNORMAL HIGH (ref 65–99)

## 2016-05-21 LAB — PROTIME-INR
INR: 1.27
Prothrombin Time: 16 seconds — ABNORMAL HIGH (ref 11.4–15.2)

## 2016-05-21 LAB — CBC
HCT: 29.2 % — ABNORMAL LOW (ref 36.0–46.0)
HEMOGLOBIN: 9.7 g/dL — AB (ref 12.0–15.0)
MCH: 31.2 pg (ref 26.0–34.0)
MCHC: 33.2 g/dL (ref 30.0–36.0)
MCV: 93.9 fL (ref 78.0–100.0)
PLATELETS: 223 10*3/uL (ref 150–400)
RBC: 3.11 MIL/uL — AB (ref 3.87–5.11)
RDW: 15.1 % (ref 11.5–15.5)
WBC: 15.9 10*3/uL — AB (ref 4.0–10.5)

## 2016-05-21 MED ORDER — VITAL 1.5 CAL PO LIQD
1000.0000 mL | ORAL | Status: DC
  Administered 2016-05-21 – 2016-05-27 (×5): 1000 mL
  Filled 2016-05-21 (×6): qty 1000

## 2016-05-21 MED ORDER — WARFARIN SODIUM 3 MG PO TABS
3.0000 mg | ORAL_TABLET | Freq: Once | ORAL | Status: AC
  Administered 2016-05-21: 3 mg via ORAL
  Filled 2016-05-21: qty 1

## 2016-05-21 MED ORDER — BUPIVACAINE 0.25 % ON-Q PUMP DUAL CATH 400 ML
400.0000 mL | INJECTION | Status: DC
  Filled 2016-05-21 (×2): qty 400

## 2016-05-21 NOTE — Progress Notes (Signed)
Physical Therapy Treatment Patient Details Name: [name] MRN: [id number] DOB: 1934/05/04 Today's Date: 05/21/2016    History of Present Illness The patient is a 82 year old female who presents with gastric cancer. [facility] stays for retching since 01/15/16 when she was d/c's have a partial gastrectomy. Pt admitted on 9/12 and underwent Roux en Y gastrojejunostomy with disconnection of antrum from duodenum (whipple). PMH - CAD, Afib, pacer, multiple RLE surgeries, CKD, HTN    PT Comments    Pt remains motivated and making slow, steady progress.  Follow Up Recommendations  Home health PT;Supervision/Assistance - 24 hour (pt may need SNF if she can't get to minA level)     Equipment Recommendations  None recommended by PT    Recommendations for Other Services       Precautions / Restrictions Precautions Precautions: Fall Precaution Comments: has multiple lines and tubes Restrictions Weight Bearing Restrictions: No    Mobility  Bed Mobility Overal bed mobility: Needs Assistance;+2 for physical assistance Bed Mobility: Rolling;Sidelying to Sit Rolling: Mod assist Sidelying to sit: +2 for physical assistance;Mod assist       General bed mobility comments: Assist to bring legs off and elevate trunk.  Transfers Overall transfer level: Needs assistance Equipment used: Rolling walker (2 wheeled) Transfers: Sit to/from Stand Sit to Stand: +2 safety/equipment;Min assist Stand pivot transfers: +2 physical assistance;+2 safety/equipment       General transfer comment: Assist to bring hips up.  Ambulation/Gait Ambulation/Gait assistance: Min assist;+2 safety/equipment Ambulation Distance (Feet): 25 Feet (x 2) Assistive device: Rolling walker (2 wheeled) Gait Pattern/deviations: Step-through pattern;Decreased step length - right;Decreased step length - left;Shuffle Gait velocity: slow Gait velocity interpretation: Below normal speed for age/gender General Gait  Details: Assist to for balance and support. Pt with 3-4 minute sitting rest break between walks   Stairs            Wheelchair Mobility    Modified Rankin (Stroke Patients Only)       Balance Overall balance assessment: Needs assistance Sitting-balance support: Feet supported;Bilateral upper extremity supported Sitting balance-Leahy Scale: Poor Sitting balance - Comments: UE support   Standing balance support: Bilateral upper extremity supported Standing balance-Leahy Scale: Poor Standing balance comment: walker and min A for static standing                    Cognition Arousal/Alertness: Awake/alert Behavior During Therapy: WFL for tasks assessed/performed Overall Cognitive Status: Within Functional Limits for tasks assessed                      Exercises      General Comments        Pertinent Vitals/Pain Pain Assessment: Faces Faces Pain Scale: Hurts even more Pain Location: abdomen Pain Descriptors / Indicators: Grimacing Pain Intervention(s): Limited activity within patient's tolerance;Monitored during session;Premedicated before session    Home Living                      Prior Function            PT Goals (current goals can now be found in the care plan section) Acute Rehab PT Goals Patient Stated Goal: home Progress towards PT goals: Progressing toward goals    Frequency   PT Diagnosis  Min 3X/week   S/P gastric surgery - Plan: DG UGI W/Water Sol Cm, DG UGI W/Water Sol Cm, CANCELED: DG UGI  W/KUB, CANCELED: DG UGI  W/KUB  PT Plan Current plan remains appropriate    Co-evaluation             End of Session Equipment Utilized During Treatment:  (No gait belt due to extensive abdominal incision) Activity Tolerance: Patient limited by fatigue Patient left: in bed;with call bell/phone within reach;with nursing/sitter in room     Time: 1150-1214 PT Time Calculation (min) (ACUTE ONLY): 24 min  Charges:   $Gait Training: 23-37 mins                    G Codes:      [name] June 17, 2016, 12:37 PM [facility] PT [phone]

## 2016-05-21 NOTE — Progress Notes (Addendum)
3 Days Post-Op  Subjective: Upper GI did not show leak.  Continues to have no retching.  Tolerated sips of clears.  HR much more controlled.    Objective: Vital signs in last 24 hours: Temp:  [97.9 F (36.6 C)-100.7 F (38.2 C)] 97.9 F (36.6 C) (09/15 0700) Pulse Rate:  [68-90] 77 (09/15 0700) Resp:  [20-33] 24 (09/15 0700) BP: (101-148)/(54-132) 119/68 (09/15 0700) SpO2:  [89 %-98 %] 95 % (09/15 0700) Last BM Date:  (PTA)  Intake/Output from previous day: 09/14 0701 - 09/15 0700 In: 2532.7 [I.V.:1710.8; NG/GT:161.8] Out: 460 [Urine:460] Intake/Output this shift: No intake/output data recorded.  General appearance: alert, cooperative and no distress Resp: breathing comfortably Cardio: regular rate and rhythm GI: soft, non distended, dressing c/d/i.   OnQ in place, empty.    Lab Results:   Recent Labs  05/20/16 0407 05/21/16 0223  WBC 16.0* 15.9*  HGB 10.8* 9.7*  HCT 33.0* 29.2*  PLT 225 223   BMET  Recent Labs  05/20/16 0407 05/21/16 0223  NA 133* 131*  K 4.6 3.8  CL 101 101  CO2 26 23  GLUCOSE 136* 122*  BUN 23* 19  CREATININE 0.77 0.76  CALCIUM 8.6* 8.2*   PT/INR  Recent Labs  05/19/16 0403 05/21/16 0223  LABPROT 14.6 16.0*  INR 1.13 1.27   ABG No results for input(s): PHART, HCO3 in the last 72 hours.  Invalid input(s): PCO2, PO2  Studies/Results: Dg Ugi W/water Sol Cm  Result Date: 05/20/2016 CLINICAL DATA:  Status post Roux-en-Y EXAM: WATER SOLUBLE UPPER GI SERIES TECHNIQUE: Single-column upper GI series was performed using water soluble contrast. CONTRAST:  Approximately 50 cc water-soluble contrast COMPARISON:  CT scan 03/01/2016. FLUOROSCOPY TIME:  Fluoroscopy Time:  2 minutes and 12 seconds. Radiation Exposure Index (if provided by the fluoroscopic device): Number of Acquired Spot Images: 13 FINDINGS: Pre-procedure KUB shows NG tube in place, compatible with cannulation of the efferent limb. J-tube is also visualized. Pneumatosis noted  in the colon, as observed at the time of recent surgery 2 days ago. To confirm absence of intraperitoneal free air, decubitus film was obtained showing no evidence for intraperitoneal free gas. Patient was given water-soluble contrast by mouth. Swallowing made patient on comparable when she was very concerned about vomiting. In a slightly upright position, there is slow migration of water-soluble contrast down the esophagus into the stomach. There was stasis of contrast in the stomach and limited gastric peristalsis. Eventually, contrast did pass through the gastrojejunostomy along the course of the NG tube into a largely aperistaltic efferent limb. There is no evidence for contrast leak at the level of the gastroenteric anastomosis. Given the lack of peristalsis and patient's concern about consuming additional contrast material, the distal Roux anastomosis could not be visualized. Although the distal anastomosis could not be demonstrated, there is no findings to suggest dilatation of the efferent limb. IMPRESSION: No evidence for contrast leak at the level of the gastroenteric anastomosis with very sluggish peristalsis in both the gastric remnant and efferent limb. No evidence for obstruction at the gastrojejunostomy. No evidence for dilatation of the efferent limb. Electronically Signed   By: Misty Stanley M.D.   On: 05/20/2016 14:14    Anti-infectives: Anti-infectives    Start     Dose/Rate Route Frequency Ordered Stop   05/18/16 1600  ceFAZolin (ANCEF) IVPB 2g/100 mL premix     2 g 200 mL/hr over 30 Minutes Intravenous Every 8 hours 05/18/16 1240 05/18/16 1730  05/18/16 0700  ceFAZolin (ANCEF) IVPB 2g/100 mL premix     2 g 200 mL/hr over 30 Minutes Intravenous To ShortStay Surgical 05/17/16 1418 05/18/16 0810      Assessment/Plan: s/p Procedure(s): EXPLORATORY LAPAROTOMY (N/A) ROUX EN Y GASTROJEJUNOSTOMY (N/A) LYSIS OF ADHESION  Unclear cause of foamy air bubbles along colon (see picture in  media).  WBCs up to 19, but coming back down.    Atrial fibrillation- off dilt gtt, now on q6 hour immediate release diltiazem.  Will switch to controlled release tomorrow.      HTN - oral metoprolol seems to be doing a better job.  Advance feeds today and switch to full liquids.    Transfer out of ICU  PT Care management for setting up [facility] PT and nursing.     LOS: 3 days    [facility] 05/21/2016

## 2016-05-21 NOTE — Progress Notes (Signed)
ANTICOAGULATION CONSULT NOTE - Follow Up Consult  Pharmacy Consult for Coumadin Indication: atrial fibrillation  Allergies  Allergen Reactions  . Amiodarone Other (See Comments)     Thyroid and liver and kidney problems  . Codeine Nausea And Vomiting  . Hydrocodone Other (See Comments)    hallucination  . Morphine And Related Nausea And Vomiting  . Requip [Ropinirole Hcl] Other (See Comments)    Headache   . Zinc Nausea Only    nausea  . Reglan [Metoclopramide] Hives, Itching and Rash    Made face red, also    Patient Measurements: Height: 5\' 1"  (154.9 cm) Weight: 143 lb 4.8 oz (65 kg) IBW/kg (Calculated) : 47.8  Vital Signs: Temp: 98.3 F (36.8 C) (09/15 1100) Temp Source: Oral (09/15 0700) BP: 125/68 (09/15 1058) Pulse Rate: 90 (09/15 1058)  Labs:  Recent Labs  05/19/16 0403 05/20/16 0407 05/21/16 0223  HGB 12.7 10.8* 9.7*  HCT 36.8 33.0* 29.2*  PLT 249 225 223  LABPROT 14.6  --  16.0*  INR 1.13  --  1.27  CREATININE 0.90 0.77 0.76    Estimated Creatinine Clearance: 48.4 mL/min (by C-G formula based on SCr of 0.76 mg/dL).  Assessment:   82 yr old female on Coumadin prior to admission for afib and hx stroke.   Held for surgery on 9/12, resumed on 9/14.   No evidence of leak or obstruction on upper GI, cleared for meds PO or per tube.    Lovenox 30 mg sq q24hrs begun on 9/13.     INR 1.27 after Coumadin 3 mg x 1 last night. Patient currently sleeping, didn't wake her, but reported to have taken tablet yesterday. Some meds are liquid per tube.    Home Coumadin regimen:  2 mg daily.  Last pta dose 9/6.  Goal of Therapy:  INR 2-3 Monitor platelets by anticoagulation protocol: Yes   Plan:   Coumadin 3 mg PO x 1 again.  Per conversation on 9/13, she would like to take Coumadin in applesauce, but not crushed, when allowed.  Daily PT/INR.  On Lovenox 30 mg sq q24hrs.  Stop when INR >2  Arty Baumgartner, [place] Pager: [phone] 05/21/2016,2:37 PM

## 2016-05-21 NOTE — Care Management Note (Signed)
Case Management Note  Patient Details  Name: [name] MRN: [id number] Date of Birth: Jun 15, 1934  Subjective/Objective:      S/p exp lap, roux en y gastrojejunostomy and lysis of adhesion     - pt went into A fib during procedure placed on Cardizem drip.            Action/Plan:  PTA from home with daughters - pt uses 4 prong cane and walker in the home.   Pt is already active [facility] for [facility].  [facility] is providing the following services: RN, PT, OT and ST - agency is aware of admit.  Pt states she would like to resume services at discharge. CM will request resumption orders prior to discharge via physician sticky notes.     Expected Discharge Date:                  Expected Discharge Plan:  [place]  In-House Referral:     Discharge planning Services  CM Consult  Post Acute Care Choice:  Resumption of Svcs/PTA Provider Choice offered to:  Patient  DME Arranged:    DME Agency:     HH Arranged:  RN, PT, OT, Speech Therapy [place] Agency:  [facility]  Status of Service:  In process, will continue to follow  If discussed at Long Length of Stay Meetings, dates discussed:    Additional Comments: Resumption services ordered.  CM requested that attending order required face to face [name], RN 05/21/2016, 10:26 AM

## 2016-05-21 NOTE — Care Management Important Message (Signed)
Important Message  Patient Details  Name: [name] MRN: [id number] Date of Birth: 1933/11/25   Medicare Important Message Given:  Yes    Coltin Casher [place] 05/21/2016, 11:24 AM

## 2016-05-21 NOTE — Progress Notes (Addendum)
Initial Nutrition Assessment  DOCUMENTATION CODES:   Non-severe (moderate) malnutrition in context of chronic illness  INTERVENTION:   During hospitalization recommend:   Vital 1.5 formula at goal rate of 50 ml/hr x 24 hours   Prostat liquid protein 30 ml daily via tube  Total TF regimen to provide 1900 kcals, 96 gm protein, 917 ml of free water  NUTRITION DIAGNOSIS:   Inadequate oral intake related to altered GI function as evidenced by  (supplemental TF via J tube)  GOAL:   Patient will meet greater than or equal to 90% of their needs  MONITOR:   TF tolerance, PO intake, Labs, Weight trends, I & O's  REASON FOR ASSESSMENT:   Other (Comment) (Home Tube Feeding)  ASSESSMENT:   82 year old Female who presented with gastric cancer. [facility] hospitals stays for retching since 01/15/16 when she was d/c's have a partial gastrectomy. Pt admitted on 9/12 and underwent Roux en Y gastrojejunostomy with disconnection of antrum from duodenum (whipple). PMH - CAD, Afib, pacer, multiple RLE surgeries, CKD, HTN  Patient s/p procedure 9/12: ROUX EN Y GASTROJEJUNOSTOMY WITH DISCONNECTION OF ANTRUM FROM DUODENUM  RD spoke with pt's daughter at bedside. Familiar to Clinical Nutrition during previous admissions. Seen most recently in July 2017 >> identified with moderate malnutrition which is ongoing. Per daughter, pt takes very little by mouth (ex: applesauce with medications). Pt receives continuous TF at home of Vital 1.5 formula at 55 ml/hr over 20 hour period (1650 kcals, 74 gm protein, 840 ml of water). Vital 1.5 formula currently infusing at 20 ml/hr via tube.  Nutrition-Focused physical exam completed. Findings are mild to moderate fat depletion, mild to moderate muscle depletion, and no edema.   CBG (last 3)   Recent Labs  05/21/16 0757 05/21/16 1229  GLUCAP 122* 152*   Diet Order:  Diet full liquid Room service appropriate? Yes; Fluid consistency: Thin  Skin:   Reviewed, no issues  Last BM:  N/A  Height:   Ht Readings from Last 1 Encounters:  05/20/16 5\' 1"  (1.549 m)    Weight:   Wt Readings from Last 1 Encounters:  05/18/16 143 lb 4.8 oz (65 kg)    Ideal Body Weight:  47.7 kg  BMI:  Body mass index is 27.08 kg/m.  Estimated Nutritional Needs:   Kcal:  1800-2000  Protein:  95-105 gm  Fluid:  1.8-2.0 L  EDUCATION NEEDS:   No education needs identified at this time  Arthur Holms, RD, LDN Pager #: [phone] After-Hours Pager #: [phone]

## 2016-05-21 NOTE — Progress Notes (Signed)
Pt arrived to unit.  Telemetry placed and CCMD notified.  Pt and daughter oriented to room including call light and telephone.  Discussed plan of care with Pt and daughter.  All questions answered.  Pt stable at this time.  Will cont to monitor.

## 2016-05-22 LAB — URINALYSIS, ROUTINE W REFLEX MICROSCOPIC
BILIRUBIN URINE: NEGATIVE
GLUCOSE, UA: NEGATIVE mg/dL
KETONES UR: NEGATIVE mg/dL
NITRITE: NEGATIVE
PH: 7 (ref 5.0–8.0)
PROTEIN: NEGATIVE mg/dL
Specific Gravity, Urine: 1.007 (ref 1.005–1.030)

## 2016-05-22 LAB — GLUCOSE, CAPILLARY: GLUCOSE-CAPILLARY: 123 mg/dL — AB (ref 65–99)

## 2016-05-22 LAB — CBC
HCT: 30.4 % — ABNORMAL LOW (ref 36.0–46.0)
Hemoglobin: 10.1 g/dL — ABNORMAL LOW (ref 12.0–15.0)
MCH: 31.7 pg (ref 26.0–34.0)
MCHC: 33.2 g/dL (ref 30.0–36.0)
MCV: 95.3 fL (ref 78.0–100.0)
PLATELETS: 250 10*3/uL (ref 150–400)
RBC: 3.19 MIL/uL — AB (ref 3.87–5.11)
RDW: 15.2 % (ref 11.5–15.5)
WBC: 13.8 10*3/uL — ABNORMAL HIGH (ref 4.0–10.5)

## 2016-05-22 LAB — URINE MICROSCOPIC-ADD ON

## 2016-05-22 LAB — BASIC METABOLIC PANEL
Anion gap: 10 (ref 5–15)
BUN: 14 mg/dL (ref 6–20)
CALCIUM: 8.5 mg/dL — AB (ref 8.9–10.3)
CO2: 25 mmol/L (ref 22–32)
Chloride: 99 mmol/L — ABNORMAL LOW (ref 101–111)
Creatinine, Ser: 0.77 mg/dL (ref 0.44–1.00)
Glucose, Bld: 116 mg/dL — ABNORMAL HIGH (ref 65–99)
Potassium: 3.9 mmol/L (ref 3.5–5.1)
SODIUM: 134 mmol/L — AB (ref 135–145)

## 2016-05-22 LAB — PROTIME-INR
INR: 1.19
PROTHROMBIN TIME: 15.1 s (ref 11.4–15.2)

## 2016-05-22 MED ORDER — WARFARIN SODIUM 3 MG PO TABS
3.0000 mg | ORAL_TABLET | Freq: Once | ORAL | Status: AC
  Administered 2016-05-22: 3 mg via ORAL
  Filled 2016-05-22: qty 1

## 2016-05-22 NOTE — Progress Notes (Signed)
4 Days Post-Op  Subjective: Feels better. Passing flatus and having bm's. Tube feeds at 20cc/hr  Objective: Vital signs in last 24 hours: Temp:  [98.1 F (36.7 C)-99.6 F (37.6 C)] 98.1 F (36.7 C) (09/16 0347) Pulse Rate:  [82-98] 82 (09/16 0347) Resp:  [17-20] 20 (09/16 0347) BP: (120-153)/(67-84) 147/67 (09/16 0347) SpO2:  [89 %-100 %] 96 % (09/16 0347) Weight:  [66.6 kg (146 lb 12.8 oz)] 66.6 kg (146 lb 12.8 oz) (09/16 0347) Last BM Date: 05/22/16  Intake/Output from previous day: 09/15 0701 - 09/16 0700 In: 1120 [P.O.:220; I.V.:550; NG/GT:190] Out: 475 [Urine:475] Intake/Output this shift: Total I/O In: 60 [Other:60] Out: -   Resp: clear to auscultation bilaterally Cardio: irregularly irregular rhythm GI: soft, mild tenderness.  Lab Results:   Recent Labs  05/21/16 0223 05/22/16 0358  WBC 15.9* 13.8*  HGB 9.7* 10.1*  HCT 29.2* 30.4*  PLT 223 250   BMET  Recent Labs  05/21/16 0223 05/22/16 0358  NA 131* 134*  K 3.8 3.9  CL 101 99*  CO2 23 25  GLUCOSE 122* 116*  BUN 19 14  CREATININE 0.76 0.77  CALCIUM 8.2* 8.5*   PT/INR  Recent Labs  05/21/16 0223 05/22/16 0358  LABPROT 16.0* 15.1  INR 1.27 1.19   ABG No results for input(s): PHART, HCO3 in the last 72 hours.  Invalid input(s): PCO2, PO2  Studies/Results: Dg Ugi W/water Sol Cm  Result Date: 05/20/2016 CLINICAL DATA:  Status post Roux-en-Y EXAM: WATER SOLUBLE UPPER GI SERIES TECHNIQUE: Single-column upper GI series was performed using water soluble contrast. CONTRAST:  Approximately 50 cc water-soluble contrast COMPARISON:  CT scan 03/01/2016. FLUOROSCOPY TIME:  Fluoroscopy Time:  2 minutes and 12 seconds. Radiation Exposure Index (if provided by the fluoroscopic device): Number of Acquired Spot Images: 13 FINDINGS: Pre-procedure KUB shows NG tube in place, compatible with cannulation of the efferent limb. J-tube is also visualized. Pneumatosis noted in the colon, as observed at the time of  recent surgery 2 days ago. To confirm absence of intraperitoneal free air, decubitus film was obtained showing no evidence for intraperitoneal free gas. Patient was given water-soluble contrast by mouth. Swallowing made patient on comparable when she was very concerned about vomiting. In a slightly upright position, there is slow migration of water-soluble contrast down the esophagus into the stomach. There was stasis of contrast in the stomach and limited gastric peristalsis. Eventually, contrast did pass through the gastrojejunostomy along the course of the NG tube into a largely aperistaltic efferent limb. There is no evidence for contrast leak at the level of the gastroenteric anastomosis. Given the lack of peristalsis and patient's concern about consuming additional contrast material, the distal Roux anastomosis could not be visualized. Although the distal anastomosis could not be demonstrated, there is no findings to suggest dilatation of the efferent limb. IMPRESSION: No evidence for contrast leak at the level of the gastroenteric anastomosis with very sluggish peristalsis in both the gastric remnant and efferent limb. No evidence for obstruction at the gastrojejunostomy. No evidence for dilatation of the efferent limb. Electronically Signed   By: Misty Stanley M.D.   On: 05/20/2016 14:14    Anti-infectives: Anti-infectives    Start     Dose/Rate Route Frequency Ordered Stop   05/18/16 1600  ceFAZolin (ANCEF) IVPB 2g/100 mL premix     2 g 200 mL/hr over 30 Minutes Intravenous Every 8 hours 05/18/16 1240 05/18/16 1730   05/18/16 0700  ceFAZolin (ANCEF) IVPB 2g/100 mL premix  2 g 200 mL/hr over 30 Minutes Intravenous To ShortStay Surgical 05/17/16 1418 05/18/16 0810      Assessment/Plan: s/p Procedure(s): EXPLORATORY LAPAROTOMY (N/A) ROUX EN Y GASTROJEJUNOSTOMY (N/A) LYSIS OF ADHESION Advance diet. Start soft foods today Ambulate Afib. On diltiazem and metoprolol Continue abx for  pneumatosis. Wbc improving  LOS: 4 days    TOTH III,Alyne Martinson S 05/22/2016

## 2016-05-22 NOTE — Progress Notes (Signed)
ANTICOAGULATION CONSULT NOTE - Follow Up Consult  Pharmacy Consult for Coumadin Indication: atrial fibrillation  Allergies  Allergen Reactions  . Amiodarone Other (See Comments)     Thyroid and liver and kidney problems  . Codeine Nausea And Vomiting  . Hydrocodone Other (See Comments)    hallucination  . Morphine And Related Nausea And Vomiting  . Requip [Ropinirole Hcl] Other (See Comments)    Headache   . Zinc Nausea Only    nausea  . Reglan [Metoclopramide] Hives, Itching and Rash    Made face red, also    Patient Measurements: Height: 5\' 1"  (154.9 cm) Weight: 146 lb 12.8 oz (66.6 kg) IBW/kg (Calculated) : 47.8  Vital Signs: Temp: 97.5 F (36.4 C) (09/16 1426) Temp Source: Oral (09/16 1221) BP: 149/82 (09/16 1221) Pulse Rate: 88 (09/16 1221)  Labs:  Recent Labs  05/20/16 0407 05/21/16 0223 05/22/16 0358  HGB 10.8* 9.7* 10.1*  HCT 33.0* 29.2* 30.4*  PLT 225 223 250  LABPROT  --  16.0* 15.1  INR  --  1.27 1.19  CREATININE 0.77 0.76 0.77    Estimated Creatinine Clearance: 49 mL/min (by C-G formula based on SCr of 0.77 mg/dL).  Assessment:   82 yr old female on Coumadin prior to admission for afib and hx stroke.   Held for surgery on 9/12, resumed on 9/14.   No evidence of leak or obstruction on upper GI, cleared for meds PO or per tube.    Lovenox 30 mg sq q24hrs begun on 9/13.     INR 1.19 after Coumadin 3 mg x 2. Patient currently sleeping, didn't wake her, but reported to have taken tablet yesterday. Some meds are liquid per tube.  Home Coumadin regimen:  2 mg daily.  Last pta dose 9/6.  Goal of Therapy:  INR 2-3 Monitor platelets by anticoagulation protocol: Yes   Plan:   Coumadin 3 mg PO x 1 again.  Per conversation on 9/13, she would like to take Coumadin in applesauce, but not crushed, when allowed.  Daily PT/INR.  On Lovenox 30 mg sq q24hrs.  Stop when INR >2  Uvaldo Rising, BCPS  Clinical Pharmacist Pager [phone]  05/22/2016 3:59 PM

## 2016-05-22 NOTE — Progress Notes (Signed)
Call placed to attending on call service.  Left message to notify attending of change in Pt condition.  Per Pt daughters, Pt able to bear weight Wednesday and Thursday and able to ambulate short distance with a walker.  Pt arrived to unit yesterday, Friday, and Pt required 3 person assist to pivot transfer from [facility] to bed.  Both daughters and NT attempted to assist Pt on [facility] this AM with difficulty.  Per daughters this is a change in function since Pt was on prior unit.  Additionally alerted attending that Pt had low grade fever and redness with purulent discharge around G tube site.  Pt not currently taking any antibiotics.  Will await call back.

## 2016-05-23 LAB — CBC
HEMATOCRIT: 29.6 % — AB (ref 36.0–46.0)
Hemoglobin: 9.9 g/dL — ABNORMAL LOW (ref 12.0–15.0)
MCH: 31.6 pg (ref 26.0–34.0)
MCHC: 33.4 g/dL (ref 30.0–36.0)
MCV: 94.6 fL (ref 78.0–100.0)
Platelets: 281 10*3/uL (ref 150–400)
RBC: 3.13 MIL/uL — ABNORMAL LOW (ref 3.87–5.11)
RDW: 15.6 % — AB (ref 11.5–15.5)
WBC: 13.6 10*3/uL — AB (ref 4.0–10.5)

## 2016-05-23 LAB — BASIC METABOLIC PANEL
Anion gap: 12 (ref 5–15)
BUN: 11 mg/dL (ref 6–20)
CALCIUM: 8.1 mg/dL — AB (ref 8.9–10.3)
CHLORIDE: 98 mmol/L — AB (ref 101–111)
CO2: 24 mmol/L (ref 22–32)
CREATININE: 0.79 mg/dL (ref 0.44–1.00)
GFR calc non Af Amer: >60 mL/min (ref >60)
GLUCOSE: 119 mg/dL — AB (ref 65–99)
Potassium: 3.7 mmol/L (ref 3.5–5.1)
Sodium: 134 mmol/L — ABNORMAL LOW (ref 135–145)

## 2016-05-23 LAB — PROTIME-INR
INR: 1.53
Prothrombin Time: 18.5 seconds — ABNORMAL HIGH (ref 11.4–15.2)

## 2016-05-23 MED ORDER — SODIUM CHLORIDE 0.9 % IV SOLN
3.0000 g | Freq: Three times a day (TID) | INTRAVENOUS | Status: DC
  Administered 2016-05-23 – 2016-05-28 (×16): 3 g via INTRAVENOUS
  Filled 2016-05-23 (×18): qty 3

## 2016-05-23 MED ORDER — GERHARDT'S BUTT CREAM
TOPICAL_CREAM | Freq: Four times a day (QID) | CUTANEOUS | Status: DC
  Administered 2016-05-23 – 2016-05-27 (×13): via TOPICAL
  Administered 2016-05-27: 1 via TOPICAL
  Administered 2016-05-28 (×3): via TOPICAL
  Administered 2016-05-30: 1 via TOPICAL
  Administered 2016-05-31: 10:00:00 via TOPICAL
  Filled 2016-05-23 (×5): qty 1

## 2016-05-23 MED ORDER — WARFARIN SODIUM 4 MG PO TABS
4.0000 mg | ORAL_TABLET | Freq: Once | ORAL | Status: AC
  Administered 2016-05-23: 4 mg via ORAL
  Filled 2016-05-23: qty 1

## 2016-05-23 NOTE — Progress Notes (Signed)
ANTICOAGULATION CONSULT NOTE - Follow Up Consult  Pharmacy Consult for Coumadin Indication: atrial fibrillation  Allergies  Allergen Reactions  . Amiodarone Other (See Comments)     Thyroid and liver and kidney problems  . Codeine Nausea And Vomiting  . Hydrocodone Other (See Comments)    hallucination  . Morphine And Related Nausea And Vomiting  . Requip [Ropinirole Hcl] Other (See Comments)    Headache   . Zinc Nausea Only    nausea  . Reglan [Metoclopramide] Hives, Itching and Rash    Made face red, also    Patient Measurements: Height: 5\' 1"  (154.9 cm) Weight: 146 lb 12.8 oz (66.6 kg) IBW/kg (Calculated) : 47.8  Vital Signs: Temp: 98.3 F (36.8 C) (09/17 0456) Temp Source: Oral (09/17 0456) BP: 148/68 (09/17 1333) Pulse Rate: 97 (09/17 0456)  Labs:  Recent Labs  05/21/16 0223 05/22/16 0358 05/23/16 0354  HGB 9.7* 10.1* 9.9*  HCT 29.2* 30.4* 29.6*  PLT 223 250 281  LABPROT 16.0* 15.1 18.5*  INR 1.27 1.19 1.53  CREATININE 0.76 0.77 0.79    Estimated Creatinine Clearance: 49 mL/min (by C-G formula based on SCr of 0.79 mg/dL).  Assessment:   82 yr old female on Coumadin prior to admission for afib and hx stroke.   Held for surgery on 9/12, resumed on 9/14.   No evidence of leak or obstruction on upper GI, cleared for meds PO or per tube.    Lovenox 30 mg sq q24hrs begun on 9/13.     INR 1.53 after Coumadin 3 mg x 3. Patient currently sleeping, didn't wake her, but reported to have taken tablet yesterday. Some meds are liquid per tube.  Home Coumadin regimen:  2 mg daily.  Last pta dose 9/6.  Goal of Therapy:  INR 2-3 Monitor platelets by anticoagulation protocol: Yes   Plan:   Coumadin 4 mg PO x 1.  Per conversation on 9/13, she would like to take Coumadin in applesauce, but not crushed, when allowed.  Daily PT/INR.  On Lovenox 30 mg sq q24hrs.  Stop when INR >2  Uvaldo Rising, BCPS  Clinical Pharmacist Pager [phone]   05/23/2016 3:11 PM

## 2016-05-23 NOTE — Progress Notes (Signed)
Pt states that the esophageal burning sensation has subsided since the tube feed rate has been decreased.  Will cont to monitor.

## 2016-05-23 NOTE — Progress Notes (Signed)
Received order from attending to attempt to increase Pt's tube feeding schedule from 20 ml/hr continuous.  Increased Pt rate @ 1130 to 30 ml/hr.  Pt had sensation of increasing heart burn throughout afternoon.  At this time, after discussion with Pt and daughter, will decrease tube feed back to 20 ml/hr and have surgeon revisit increase with family tomorrow.  Will cont to monitor.

## 2016-05-23 NOTE — Clinical Social Work Note (Signed)
CSW services has arranged for placement at [facility] when stable;  CM note of 9/15 indicates that patient will return home with [facility] for RN, PT, OT etc. CSW services will continue to monitor and assist with placement if indicated.  Lorie Phenix. Pauline Good, [place] (weekend coverage)

## 2016-05-23 NOTE — Progress Notes (Signed)
Progress Note: General Surgery Service   Subjective: Patient complaining of irritation around J tube drain, also notes large amount drainage aroumd onQ catheter site. Tolerating liquids but solids hard to chew/swallow due to dry feeling  Objective: Vital signs in last 24 hours: Temp:  [97.5 F (36.4 C)-99.6 F (37.6 C)] 98.3 F (36.8 C) (09/17 0456) Pulse Rate:  [88-100] 97 (09/17 0456) Resp:  [18] 18 (09/17 0456) BP: (149-180)/(72-84) 159/72 (09/17 0456) SpO2:  [92 %-99 %] 92 % (09/17 0456) Last BM Date: 05/22/16  Intake/Output from previous day: 09/16 0701 - 09/17 0700 In: 60  Out: 4 [Urine:2; Stool:2] Intake/Output this shift: No intake/output data recorded.  Lungs: CTAB  Cardiovascular: irreg irreg  Abd: soft, ATTP, incision c/d/i, J tube with sound redness around tube and evidence of bilious drainage  Extremities: no edema  Neuro: AOx4  Lab Results: CBC   Recent Labs  05/22/16 0358 05/23/16 0354  WBC 13.8* 13.6*  HGB 10.1* 9.9*  HCT 30.4* 29.6*  PLT 250 281   BMET  Recent Labs  05/22/16 0358 05/23/16 0354  NA 134* 134*  K 3.9 3.7  CL 99* 98*  CO2 25 24  GLUCOSE 116* 119*  BUN 14 11  CREATININE 0.77 0.79  CALCIUM 8.5* 8.1*   PT/INR  Recent Labs  05/22/16 0358 05/23/16 0354  LABPROT 15.1 18.5*  INR 1.19 1.53   ABG No results for input(s): PHART, HCO3 in the last 72 hours.  Invalid input(s): PCO2, PO2  Studies/Results:  Anti-infectives: Anti-infectives    Start     Dose/Rate Route Frequency Ordered Stop   05/23/16 0945  Ampicillin-Sulbactam (UNASYN) 3 g in sodium chloride 0.9 % 100 mL IVPB     3 g 100 mL/hr over 60 Minutes Intravenous Every 8 hours 05/23/16 0932     05/18/16 1600  ceFAZolin (ANCEF) IVPB 2g/100 mL premix     2 g 200 mL/hr over 30 Minutes Intravenous Every 8 hours 05/18/16 1240 05/18/16 1730   05/18/16 0700  ceFAZolin (ANCEF) IVPB 2g/100 mL premix     2 g 200 mL/hr over 30 Minutes Intravenous To ShortStay  Surgical 05/17/16 1418 05/18/16 0810      Medications: Scheduled Meds: . ampicillin-sulbactam (UNASYN) IV  3 g Intravenous Q8H  . diltiazem  45 mg Per Tube Q6H  . enoxaparin (LOVENOX) injection  30 mg Subcutaneous Q24H  . furosemide  40 mg Oral Daily  . gabapentin  200 mg Per Tube TID  . levothyroxine  50 mcg Oral QAC breakfast  . mouth rinse  15 mL Mouth Rinse BID  . metoprolol  100 mg Oral BID  . pantoprazole sodium  40 mg Per Tube Daily  . potassium chloride  20 mEq Oral Daily  . scopolamine  1 patch Transdermal Q72H  . simethicone  80 mg Per Tube QID  . Warfarin - Pharmacist Dosing Inpatient   Does not apply q1800   Continuous Infusions: . bupivacaine 0.25 % ON-Q pump DUAL CATH 400 mL    . dextrose 5 % and 0.45 % NaCl with KCl 20 mEq/L 50 mL/hr at 05/22/16 1427  . feeding supplement (VITAL 1.5 CAL) 1,000 mL (05/22/16 1626)   PRN Meds:.acetaminophen, ALPRAZolam, diphenhydrAMINE **OR** diphenhydrAMINE, hydrALAZINE, methocarbamol, morphine injection, nitroGLYCERIN, ondansetron **OR** ondansetron (ZOFRAN) IV, oxyCODONE, traMADol  Assessment/Plan: Patient Active Problem List   Diagnosis Date Noted  . Esophagitis 05/18/2016  . Stomach cancer ([place]) 05/18/2016  . Long term (current) use of anticoagulants [Z79.01] 03/19/2016  . No blood  products 02/27/2016  . AKI (acute kidney injury) ([place]) 02/17/2016  . S/P partial gastrectomy 02/17/2016  . Post gastrectomy syndrome   . Erosive esophagitis   . Gastroesophageal reflux disease with esophagitis   . Pressure ulcer 02/04/2016  . Gastric cancer ([place]) 01/15/2016  . Persistent atrial fibrillation ([place]) 01/15/2016  . Protein-calorie malnutrition, moderate ([place]) 12/08/2015  . B12 deficiency 11/24/2015  . Iron deficiency anemia due to chronic blood loss 11/24/2015  . Refusal of blood transfusions as patient is Jehovah's Witness   . Adenocarcinoma of stomach ([place]) 11/11/2015  . CKD (chronic kidney disease), stage III 11/10/2015  .  Vitamin B12 deficiency 11/09/2015  . Peripheral nerve disease ([place]) 07/18/2015  . Rupture patellar tendon 07/29/2014  . Essential hypertension 06/04/2014  . Medication management 02/15/2014  . Prediabetes 09/24/2013  . Vitamin D deficiency 09/24/2013  . Hyperlipemia   . Cardiac pacemaker (06/2010) 08/26/2012  . S/P right TKA 02/07/2012  . FHx/o Colon Cancer 07/27/2011  . Aortic stenosis, moderate 07/09/2011  . S/P repair of patent ductus arteriosus 04/22/2011  . Atrial fibrillation ([place]) Chadsvasc 5 12/02/2010  . Renal Cell Cancer ( 2003) 11/17/2007  . Hypothyroidism 11/17/2007   s/p Procedure(s): EXPLORATORY LAPAROTOMY ROUX EN Y GASTROJEJUNOSTOMY LYSIS OF ADHESION 05/18/2016 -abx for UTI -barrier cream around J tube for skin irritation -remove onQ -increase tube feeds -oral nutrition as tolerated -ambulate with assist   LOS: 5 days   Mickeal Skinner, MD Pg# [phone] [facility]

## 2016-05-24 ENCOUNTER — Inpatient Hospital Stay (HOSPITAL_COMMUNITY): Payer: Medicare Other

## 2016-05-24 LAB — PROTIME-INR
INR: 1.64
Prothrombin Time: 19.6 seconds — ABNORMAL HIGH (ref 11.4–15.2)

## 2016-05-24 MED ORDER — ALUM & MAG HYDROXIDE-SIMETH 200-200-20 MG/5ML PO SUSP
30.0000 mL | Freq: Four times a day (QID) | ORAL | Status: DC
  Administered 2016-05-24 – 2016-05-31 (×27): 30 mL via ORAL
  Filled 2016-05-24 (×31): qty 30

## 2016-05-24 MED ORDER — WARFARIN SODIUM 4 MG PO TABS
4.0000 mg | ORAL_TABLET | Freq: Once | ORAL | Status: AC
  Administered 2016-05-24: 4 mg via ORAL
  Filled 2016-05-24: qty 1

## 2016-05-24 NOTE — Consult Note (Addendum)
   [facility] CM Inpatient Consult   05/24/2016  [name] [birth year] [id number]    Patient screened for [place] Management services. Went to bedside to offer and explain [facility] Care Management program with patient. Spoke with [name] and daughter, [name]. They declined [place] Management follow up. Daughter [name] states " we have [place] will be calling too. We do not need anymore services". Accepted [facility] Care Management brochure with contact information to call in future if changes mind. Will make inpatient RNCM aware that patient declined [place] Management program services.  Marthenia Rolling, MSN-Ed, RN,BSN [facility] Liaison [phone]

## 2016-05-24 NOTE — Progress Notes (Signed)
ANTICOAGULATION CONSULT NOTE - Follow Up Consult  Pharmacy Consult for Coumadin Indication: atrial fibrillation  Allergies  Allergen Reactions  . Amiodarone Other (See Comments)     Thyroid and liver and kidney problems  . Codeine Nausea And Vomiting  . Hydrocodone Other (See Comments)    hallucination  . Morphine And Related Nausea And Vomiting  . Requip [Ropinirole Hcl] Other (See Comments)    Headache   . Zinc Nausea Only    nausea  . Reglan [Metoclopramide] Hives, Itching and Rash    Made face red, also    Patient Measurements: Height: 5\' 1"  (154.9 cm) Weight: 146 lb 12.8 oz (66.6 kg) IBW/kg (Calculated) : 47.8  Vital Signs: Temp: 98.1 F (36.7 C) (09/18 0612) Temp Source: Oral (09/18 0612) BP: 183/94 (09/18 0612) Pulse Rate: 97 (09/18 0612)  Labs:  Recent Labs  05/22/16 0358 05/23/16 0354 05/24/16 0353  HGB 10.1* 9.9*  --   HCT 30.4* 29.6*  --   PLT 250 281  --   LABPROT 15.1 18.5* 19.6*  INR 1.19 1.53 1.64  CREATININE 0.77 0.79  --     Estimated Creatinine Clearance: 49 mL/min (by C-G formula based on SCr of 0.79 mg/dL).  Assessment: 82 yr old female on Coumadin prior to admission for afib and hx stroke. Held for surgery on 9/12, resumed on 9/14. No evidence of leak or obstruction on upper GI, cleared for meds PO or per tube. Lovenox 30 mg sq q24hrs begun on 9/13. INR 1.64, continues slow trend up. Hg low stable, plt wnl, no bleed documented.  Home Coumadin regimen:  2 mg daily.  Last pta dose 9/6.  Goal of Therapy:  INR 2-3 Monitor platelets by anticoagulation protocol: Yes   Plan:  Coumadin 4 mg PO x 1 dose tonight Per conversation on 9/13, she would like to take Coumadin in applesauce, but not crushed, when allowed. Daily PT/INR. On Lovenox 30 mg sq q24hrs.  Stop when INR >2 Monitor for s/sx bleeding  Elicia Lamp, PharmD, [facility] Clinical Pharmacist Pager [phone] 05/24/2016 2:01 PM

## 2016-05-24 NOTE — Progress Notes (Signed)
PT Cancellation Note  Patient Details Name: [name] MRN: [id number] DOB: [birth year]   Cancelled Treatment:    Reason Eval/Treat Not Completed: Fatigue limiting ability to participate. Pt sleeping soundly and family requested pt be allowed to continue to rest. Will follow up tomorrow.   Masaji Billups 05/24/2016, 3:26 PM [employer] PT [phone]

## 2016-05-24 NOTE — Care Management Important Message (Signed)
Important Message  Patient Details  Name: [name] MRN: [id number] Date of Birth: 25-Feb-1934   Medicare Important Message Given:  Yes    Nathen May 05/24/2016, 9:46 AM

## 2016-05-24 NOTE — Progress Notes (Signed)
Progress Note: General Surgery Service   Subjective: Having indigestion at this point, and has burning when tube feeds increased above 20 mL/hr.  Also complains of abdominal distention despite having BMs and flatus.    Objective: Vital signs in last 24 hours: Temp:  [98.1 F (36.7 C)-99 F (37.2 C)] 99 F (37.2 C) (09/18 1452) Pulse Rate:  [86-97] 86 (09/18 1452) Resp:  [18] 18 (09/18 1452) BP: (157-183)/(77-94) 157/77 (09/18 1452) SpO2:  [91 %-97 %] 91 % (09/18 1452) Last BM Date: 05/23/16  Intake/Output from previous day: 09/17 0701 - 09/18 0700 In: 817 [P.O.:240] Out: 1 [Urine:1] Intake/Output this shift: No intake/output data recorded.  Lungs: breathing comfortably.  Cardiovascular: irreg irreg  Abd: soft, ATTP, incision c/d/i, J tube with redness around tube and evidence of bilious drainage  Extremities: no edema  Neuro: AOx4  Lab Results: CBC   Recent Labs  05/22/16 0358 05/23/16 0354  WBC 13.8* 13.6*  HGB 10.1* 9.9*  HCT 30.4* 29.6*  PLT 250 281   BMET  Recent Labs  05/22/16 0358 05/23/16 0354  NA 134* 134*  K 3.9 3.7  CL 99* 98*  CO2 25 24  GLUCOSE 116* 119*  BUN 14 11  CREATININE 0.77 0.79  CALCIUM 8.5* 8.1*   PT/INR  Recent Labs  05/23/16 0354 05/24/16 0353  LABPROT 18.5* 19.6*  INR 1.53 1.64   ABG No results for input(s): PHART, HCO3 in the last 72 hours.  Invalid input(s): PCO2, PO2  Studies/Results:  Anti-infectives: Anti-infectives    Start     Dose/Rate Route Frequency Ordered Stop   05/23/16 1030  Ampicillin-Sulbactam (UNASYN) 3 g in sodium chloride 0.9 % 100 mL IVPB     3 g 100 mL/hr over 60 Minutes Intravenous Every 8 hours 05/23/16 0932     05/18/16 1600  ceFAZolin (ANCEF) IVPB 2g/100 mL premix     2 g 200 mL/hr over 30 Minutes Intravenous Every 8 hours 05/18/16 1240 05/18/16 1730   05/18/16 0700  ceFAZolin (ANCEF) IVPB 2g/100 mL premix     2 g 200 mL/hr over 30 Minutes Intravenous To ShortStay Surgical  05/17/16 1418 05/18/16 0810      Medications: Scheduled Meds: . alum & mag hydroxide-simeth  30 mL Oral Q6H  . ampicillin-sulbactam (UNASYN) IV  3 g Intravenous Q8H  . diltiazem  45 mg Per Tube Q6H  . enoxaparin (LOVENOX) injection  30 mg Subcutaneous Q24H  . furosemide  40 mg Oral Daily  . gabapentin  200 mg Per Tube TID  . Gerhardt's butt cream   Topical QID  . levothyroxine  50 mcg Oral QAC breakfast  . mouth rinse  15 mL Mouth Rinse BID  . metoprolol  100 mg Oral BID  . pantoprazole sodium  40 mg Per Tube Daily  . potassium chloride  20 mEq Oral Daily  . scopolamine  1 patch Transdermal Q72H  . warfarin  4 mg Oral ONCE-1800  . Warfarin - Pharmacist Dosing Inpatient   Does not apply q1800   Continuous Infusions: . bupivacaine 0.25 % ON-Q pump DUAL CATH 400 mL Stopped (05/23/16 0900)  . dextrose 5 % and 0.45 % NaCl with KCl 20 mEq/L 50 mL/hr at 05/22/16 1427  . feeding supplement (VITAL 1.5 CAL) 1,000 mL (05/22/16 1626)   PRN Meds:.acetaminophen, ALPRAZolam, diphenhydrAMINE **OR** diphenhydrAMINE, hydrALAZINE, methocarbamol, morphine injection, nitroGLYCERIN, ondansetron **OR** ondansetron (ZOFRAN) IV, oxyCODONE, traMADol  Assessment/Plan: Patient Active Problem List   Diagnosis Date Noted  . Esophagitis 05/18/2016  .  Stomach cancer ([place]) 05/18/2016  . Long term (current) use of anticoagulants [Z79.01] 03/19/2016  . No blood products 02/27/2016  . AKI (acute kidney injury) ([place]) 02/17/2016  . S/P partial gastrectomy 02/17/2016  . Post gastrectomy syndrome   . Erosive esophagitis   . Gastroesophageal reflux disease with esophagitis   . Pressure ulcer 02/04/2016  . Gastric cancer ([place]) 01/15/2016  . Persistent atrial fibrillation ([place]) 01/15/2016  . Protein-calorie malnutrition, moderate ([place]) 12/08/2015  . B12 deficiency 11/24/2015  . Iron deficiency anemia due to chronic blood loss 11/24/2015  . Refusal of blood transfusions as patient is Jehovah's Witness   .  Adenocarcinoma of stomach ([place]) 11/11/2015  . CKD (chronic kidney disease), stage III 11/10/2015  . Vitamin B12 deficiency 11/09/2015  . Peripheral nerve disease ([place]) 07/18/2015  . Rupture patellar tendon 07/29/2014  . Essential hypertension 06/04/2014  . Medication management 02/15/2014  . Prediabetes 09/24/2013  . Vitamin D deficiency 09/24/2013  . Hyperlipemia   . Cardiac pacemaker (06/2010) 08/26/2012  . S/P right TKA 02/07/2012  . FHx/o Colon Cancer 07/27/2011  . Aortic stenosis, moderate 07/09/2011  . S/P repair of patent ductus arteriosus 04/22/2011  . Atrial fibrillation ([place]) Chadsvasc 5 12/02/2010  . Renal Cell Cancer ( 2003) 11/17/2007  . Hypothyroidism 11/17/2007   s/p Procedure(s): EXPLORATORY LAPAROTOMY ROUX EN Y GASTROJEJUNOSTOMY LYSIS OF ADHESION 05/18/2016 -abx for UTI -barrier cream around J tube for skin irritation  -oral maalox instead of mylicon via j tube.   Go back to liquids.   -ambulate with assist   LOS: 6 days   [name], MD Pg# [phone] [facility]  Patient ID: [name], female   DOB: June 22, 1934, 82 y.o.   MRN: [id number]

## 2016-05-25 LAB — COMPREHENSIVE METABOLIC PANEL
ALBUMIN: 2.2 g/dL — AB (ref 3.5–5.0)
ALK PHOS: 87 U/L (ref 38–126)
ALT: 17 U/L (ref 14–54)
ANION GAP: 10 (ref 5–15)
AST: 20 U/L (ref 15–41)
BILIRUBIN TOTAL: 0.4 mg/dL (ref 0.3–1.2)
BUN: 7 mg/dL (ref 6–20)
CALCIUM: 8.1 mg/dL — AB (ref 8.9–10.3)
CO2: 22 mmol/L (ref 22–32)
CREATININE: 0.64 mg/dL (ref 0.44–1.00)
Chloride: 102 mmol/L (ref 101–111)
GFR calc Af Amer: >60 mL/min (ref >60)
GFR calc non Af Amer: >60 mL/min (ref >60)
GLUCOSE: 145 mg/dL — AB (ref 65–99)
Potassium: 3.5 mmol/L (ref 3.5–5.1)
SODIUM: 134 mmol/L — AB (ref 135–145)
Total Protein: 5.6 g/dL — ABNORMAL LOW (ref 6.5–8.1)

## 2016-05-25 LAB — CBC
HEMATOCRIT: 33.9 % — AB (ref 36.0–46.0)
HEMOGLOBIN: 11.1 g/dL — AB (ref 12.0–15.0)
MCH: 31.4 pg (ref 26.0–34.0)
MCHC: 32.7 g/dL (ref 30.0–36.0)
MCV: 95.8 fL (ref 78.0–100.0)
Platelets: 284 10*3/uL (ref 150–400)
RBC: 3.54 MIL/uL — AB (ref 3.87–5.11)
RDW: 15.2 % (ref 11.5–15.5)
WBC: 10.2 10*3/uL (ref 4.0–10.5)

## 2016-05-25 LAB — CREATININE, SERUM
Creatinine, Ser: 0.7 mg/dL (ref 0.44–1.00)
GFR calc Af Amer: >60 mL/min (ref >60)
GFR calc non Af Amer: >60 mL/min (ref >60)

## 2016-05-25 LAB — PROTIME-INR
INR: 1.76
PROTHROMBIN TIME: 20.7 s — AB (ref 11.4–15.2)

## 2016-05-25 MED ORDER — WARFARIN SODIUM 3 MG PO TABS
3.0000 mg | ORAL_TABLET | Freq: Once | ORAL | Status: AC
  Administered 2016-05-25: 3 mg via ORAL
  Filled 2016-05-25: qty 1

## 2016-05-25 MED ORDER — PANTOPRAZOLE SODIUM 40 MG PO TBEC
40.0000 mg | DELAYED_RELEASE_TABLET | Freq: Every day | ORAL | Status: DC
  Administered 2016-05-26 – 2016-05-31 (×6): 40 mg via ORAL
  Filled 2016-05-25 (×6): qty 1

## 2016-05-25 NOTE — Progress Notes (Signed)
ANTICOAGULATION CONSULT NOTE - Follow Up Consult  Pharmacy Consult for Coumadin Indication: atrial fibrillation  Allergies  Allergen Reactions  . Amiodarone Other (See Comments)     Thyroid and liver and kidney problems  . Codeine Nausea And Vomiting  . Hydrocodone Other (See Comments)    hallucination  . Morphine And Related Nausea And Vomiting  . Requip [Ropinirole Hcl] Other (See Comments)    Headache   . Zinc Nausea Only    nausea  . Reglan [Metoclopramide] Hives, Itching and Rash    Made face red, also    Patient Measurements: Height: 5\' 1"  (154.9 cm) Weight: 146 lb 12.8 oz (66.6 kg) IBW/kg (Calculated) : 47.8  Vital Signs: Temp: 98.5 F (36.9 C) (09/19 0416) Temp Source: Oral (09/19 0416) BP: 168/71 (09/19 0416) Pulse Rate: 91 (09/19 0416)  Labs:  Recent Labs  05/23/16 0354 05/24/16 0353 05/25/16 0434 05/25/16 0933  HGB 9.9*  --   --  11.1*  HCT 29.6*  --   --  33.9*  PLT 281  --   --  284  LABPROT 18.5* 19.6* 20.7*  --   INR 1.53 1.64 1.76  --   CREATININE 0.79  --  0.70  --     Estimated Creatinine Clearance: 49 mL/min (by C-G formula based on SCr of 0.7 mg/dL).  Assessment: 82 yr old female on Coumadin prior to admission for afib and hx stroke. Held for surgery on 9/12, resumed on 9/14. No evidence of leak or obstruction on upper GI, cleared for meds PO or per tube. Lovenox 30 mg sq q24hrs begun on 9/13. INR 1.76, continues slow trend up. Hg up 11.1, plt wnl, no bleed documented.  Home Coumadin regimen:  2 mg daily.  Last pta dose 9/6.  Goal of Therapy:  INR 2-3 Monitor platelets by anticoagulation protocol: Yes   Plan:  Coumadin 3mg  PO x 1 dose tonight Per conversation on 9/13, she would like to take Coumadin in applesauce, but not crushed, when allowed. Daily PT/INR. On Lovenox 30 mg sq q24hrs. Stop when INR >2 Monitor for s/sx bleeding  Elicia Lamp, PharmD, [facility] Clinical Pharmacist Pager [phone] 05/25/2016 10:56 AM

## 2016-05-25 NOTE — Progress Notes (Signed)
Calorie Count Note  48 hour calorie count ordered.  Diet: Full Liquid Supplements: TF: Vital 1.5 @ 20 ml/hr via J-tube, which provides 720 kcals, 32 grams protein, 367 ml fluid daily  Spoke with pt and pt daughter at bedside. Both confirm that PO intake is generally very poor. Per pt, was consuming only applesauce PO with medications PTA.   Pt is trying to consume more PO's. Per pt daughter, she is keeping a close records of what pt is eating. Pt consumed a few sips of milk, 12 teaspoons of grits, and 1.4 cup of ice cream at breakfast this AM. Lunch tray just arrive prior to RD visit.   TF rate decreased to 20 ml/hr. Pt was advanced to 30 ml/hr, however, was decreased due to pt experiencing severe heartburn. Per [name] note, plan to transition to oral maalox.   Breakfast: 128 kcals, 4 grams protein Lunch: n/a Dinner: n/a Supplements: n/a  Nutrition Dx: Inadequate oral intake related to altered GI function as evidenced by  (supplemental TF via J tube); progressing  Goal: Patient will meet greater than or equal to 90% of their needs; unmet  Intervention:   -RD will follow for calorie count results -Continue Vital 1.5 @ 20 ml/hr via J-tube, which provides 720 kcals, 32 grams protein, 367 ml fluid daily  Shameka Aggarwal A. Jimmye Norman, RD, LDN, CDE Pager: [phone] After hours Pager: [phone]

## 2016-05-25 NOTE — Progress Notes (Addendum)
Physical Therapy Treatment Patient Details Name: [name] MRN: [id number] DOB: [birth year] Today's Date: 05/25/2016    History of Present Illness The patient is a 82 year old female who presents with gastric cancer. [facility] for retching since 01/15/16 when she was d/c's have a partial gastrectomy. Pt admitted on 9/12 and underwent Roux en Y gastrojejunostomy with disconnection of antrum from duodenum (whipple). PMH - CAD, Afib, pacer, multiple RLE surgeries, CKD, HTN    PT Comments    Pt is progressing well with mobility, she ambulated 45' with RW and performed BLE strengthening exercises.   Follow Up Recommendations  Home health PT;Supervision/Assistance - 24 hour (pt may need SNF if she can't get to minA level)     Equipment Recommendations  None recommended by PT    Recommendations for Other Services       Precautions / Restrictions Precautions Precautions: Fall Precaution Comments: has multiple lines and tubes Restrictions Weight Bearing Restrictions: No    Mobility  Bed Mobility Overal bed mobility: Needs Assistance Bed Mobility: Rolling;Sidelying to Sit Rolling: Supervision Sidelying to sit: Supervision       General bed mobility comments: used bedrail, HOB up 30*  Transfers Overall transfer level: Needs assistance Equipment used: Rolling walker (2 wheeled) Transfers: Sit to/from Stand Sit to Stand: Min guard         General transfer comment: VCs hand placement  Ambulation/Gait Ambulation/Gait assistance: Min guard Ambulation Distance (Feet): 85 Feet Assistive device: Rolling walker (2 wheeled) Gait Pattern/deviations: Step-through pattern;Decreased stride length     General Gait Details: steady with RW, distance limited by fatigue, VCs to avoid holding breath   Stairs            Wheelchair Mobility    Modified Rankin (Stroke Patients Only)       Balance     Sitting balance-Leahy Scale: Good Sitting balance -  Comments: UE support     Standing balance-Leahy Scale: Fair                      Cognition Arousal/Alertness: Awake/alert Behavior During Therapy: WFL for tasks assessed/performed Overall Cognitive Status: Within Functional Limits for tasks assessed                      Exercises General Exercises - Lower Extremity Ankle Circles/Pumps: AROM;Both;15 reps;Supine Quad Sets: AROM;Both;10 reps;Supine Gluteal Sets: AROM;Both;10 reps;Supine Short Arc Quad: AROM;Both;15 reps;Supine Heel Slides: AAROM;Both;10 reps;Supine Hip ABduction/ADduction: AAROM;Both;10 reps;Supine   Shoulder Flexion AROM x 10 both; seated    General Comments        Pertinent Vitals/Pain Pain Assessment: No/denies pain    Home Living                      Prior Function            PT Goals (current goals can now be found in the care plan section) Acute Rehab PT Goals Patient Stated Goal: "to be able to do everything" PT Goal Formulation: With patient/family Time For Goal Achievement: 05/27/16 Potential to Achieve Goals: Good Progress towards PT goals: Progressing toward goals    Frequency    Min 3X/week      PT Plan Current plan remains appropriate    Co-evaluation             End of Session Equipment Utilized During Treatment: Gait belt (No gait belt due to extensive abdominal incision) Activity Tolerance: Patient tolerated  treatment well Patient left: with call bell/phone within reach;with nursing/sitter in room;in chair;with family/visitor present     Time: [id number] PT Time Calculation (min) (ACUTE ONLY): 30 min  Charges:  $Gait Training: 8-22 mins $Therapeutic Exercise: 8-22 mins                    G Codes:      Philomena Doheny 05/25/2016, 2:29 PM [phone]

## 2016-05-26 LAB — COMPREHENSIVE METABOLIC PANEL
ALBUMIN: 2.1 g/dL — AB (ref 3.5–5.0)
ALT: 15 U/L (ref 14–54)
ANION GAP: 8 (ref 5–15)
AST: 18 U/L (ref 15–41)
Alkaline Phosphatase: 80 U/L (ref 38–126)
BUN: 5 mg/dL — AB (ref 6–20)
CALCIUM: 8.1 mg/dL — AB (ref 8.9–10.3)
CO2: 26 mmol/L (ref 22–32)
Chloride: 100 mmol/L — ABNORMAL LOW (ref 101–111)
Creatinine, Ser: 0.7 mg/dL (ref 0.44–1.00)
GFR calc Af Amer: >60 mL/min (ref >60)
GLUCOSE: 105 mg/dL — AB (ref 65–99)
POTASSIUM: 3.4 mmol/L — AB (ref 3.5–5.1)
Sodium: 134 mmol/L — ABNORMAL LOW (ref 135–145)
TOTAL PROTEIN: 5.3 g/dL — AB (ref 6.5–8.1)
Total Bilirubin: 0.6 mg/dL (ref 0.3–1.2)

## 2016-05-26 LAB — CBC
HCT: 30.2 % — ABNORMAL LOW (ref 36.0–46.0)
Hemoglobin: 9.7 g/dL — ABNORMAL LOW (ref 12.0–15.0)
MCH: 30.6 pg (ref 26.0–34.0)
MCHC: 32.1 g/dL (ref 30.0–36.0)
MCV: 95.3 fL (ref 78.0–100.0)
PLATELETS: 314 10*3/uL (ref 150–400)
RBC: 3.17 MIL/uL — ABNORMAL LOW (ref 3.87–5.11)
RDW: 15.4 % (ref 11.5–15.5)
WBC: 10 10*3/uL (ref 4.0–10.5)

## 2016-05-26 LAB — PROTIME-INR
INR: 1.89
PROTHROMBIN TIME: 22 s — AB (ref 11.4–15.2)

## 2016-05-26 MED ORDER — WARFARIN SODIUM 3 MG PO TABS
3.0000 mg | ORAL_TABLET | Freq: Once | ORAL | Status: AC
  Administered 2016-05-26: 3 mg via ORAL
  Filled 2016-05-26: qty 1

## 2016-05-26 MED ORDER — VITAL 1.5 CAL PO LIQD
1000.0000 mL | ORAL | Status: DC
  Administered 2016-05-26 – 2016-05-28 (×4): 1000 mL
  Filled 2016-05-26 (×4): qty 1000

## 2016-05-26 NOTE — Care Management Important Message (Signed)
Important Message  Patient Details  Name: [name] MRN: [id number] Date of Birth: 12-15-1933   Medicare Important Message Given:  Yes    Nathen May 05/26/2016, 9:27 AM

## 2016-05-26 NOTE — Progress Notes (Signed)
ANTICOAGULATION CONSULT NOTE - Follow Up Consult  Pharmacy Consult for Coumadin Indication: atrial fibrillation  Allergies  Allergen Reactions  . Amiodarone Other (See Comments)     Thyroid and liver and kidney problems  . Codeine Nausea And Vomiting  . Hydrocodone Other (See Comments)    hallucination  . Morphine And Related Nausea And Vomiting  . Requip [Ropinirole Hcl] Other (See Comments)    Headache   . Zinc Nausea Only    nausea  . Reglan [Metoclopramide] Hives, Itching and Rash    Made face red, also    Patient Measurements: Height: 5\' 1"  (154.9 cm) Weight: 146 lb 12.8 oz (66.6 kg) IBW/kg (Calculated) : 47.8  Vital Signs: Temp: 98 F (36.7 C) (09/20 0830) Temp Source: Oral (09/20 0830) BP: 158/55 (09/20 0830) Pulse Rate: 89 (09/20 0830)  Labs:  Recent Labs  05/24/16 0353 05/25/16 0434 05/25/16 0933 05/26/16 0426  HGB  --   --  11.1* 9.7*  HCT  --   --  33.9* 30.2*  PLT  --   --  284 314  LABPROT 19.6* 20.7*  --  22.0*  INR 1.64 1.76  --  1.89  CREATININE  --  0.70 0.64 0.70    Estimated Creatinine Clearance: 49 mL/min (by C-G formula based on SCr of 0.7 mg/dL).  Assessment: 82 yr old female on Coumadin prior to admission for afib and hx stroke. Held for surgery on 9/12, resumed on 9/14. No evidence of leak or obstruction on upper GI, cleared for meds PO or per tube. Lovenox 30 mg sq q24hrs begun on 9/13. INR 1.89, continues slow trend up. Hg down 9.7, plt wnl, no bleed documented.  Home Coumadin regimen:  2 mg daily.  Last pta dose 9/6.  Goal of Therapy:  INR 2-3 Monitor platelets by anticoagulation protocol: Yes   Plan:  Coumadin 3mg  PO x 1 dose tonight Per conversation on 9/13, she would like to take Coumadin in applesauce, but not crushed, when allowed. Daily PT/INR Lovenox 30 mg sq q24h per MD. Stop when INR >2 Monitor for s/sx bleeding  Elicia Lamp, PharmD, [facility] Clinical Pharmacist Pager [phone] 05/26/2016 11:07 AM

## 2016-05-26 NOTE — Progress Notes (Signed)
Calorie Count Note  48 hour calorie count ordered.  Diet: Full Liquid Supplements: Vital 1.5 @ 25 ml/hr via j-tube, which provides 900 kcals, 41 grams protein, and 456 ml fluid   Spoke with pt and pt daughter at bedside. Pt is in good spirits today; smiling and eating chocolate frozen yogurt at time of visit.   Per pt daughter, she has observed that pt intake has increased. Calorie count estimates calculated using pt interview, documented meal percentages, and nutrient analysis software.   Vital 1.5 currently infusing via j-tube @ 25 ml/hr, which provides 900 kcals, 41 grams protein, and 456 ml fluid (meeting 50% of estimated kcal needs and 43% of estimated protein needs). Pt daughter confirmed plan to increase 5 ml/hr every 8 hours to goal rate of 35 ml/hr, which provides 1260 kcals, 57 grams protein, and 645 ml fluid daily (meeting 70% of estimated kcal needs and 60% of estimated protein needs).   Breakfast: 128 kcals, 4 grams protein Lunch: 125 kcals, 2 grams protein Dinner: 91 kcals, 4 grams protein Supplements: n/a  Total intake (PO's alone): 344 kcal (19% of minimum estimated needs)  8 protein (8% of minimum estimated needs)  Total intake (with TF): 1244 kcal (69% of minimum estimated needs)  49 protein (52% of minimum estimated needs)  Nutrition Dx: Inadequate oral intakerelated to altered GI functionas evidenced by (supplemental TF via J tube); progressing  Goal: Patient will meet greater than or equal to 90% of their needs; unmet  Intervention:   -RD will follow for calorie count results -Continue Vital 1.5 @ 25 ml/hr via j-tube and increase 5 ml/hr every 8 hours to goal rate of 35 ml/hr, which provides 1260 kcals, 57 grams protein, and 645 ml fluid daily (meeting 70% of estimated kcal needs and 60% of estimated protein needs).   Ranetta Armacost A. Jimmye Norman, RD, LDN, CDE Pager: [phone] After hours Pager: [phone]

## 2016-05-26 NOTE — Progress Notes (Signed)
Progress Note: General Surgery Service   Subjective: Worked on calorie counts yesterday and patient not taking in enough with PO and 20 ml/hr via J tube.  Still not having retching and indigestion improved.    Objective: Vital signs in last 24 hours: Temp:  [97.5 F (36.4 C)-99.9 F (37.7 C)] 99.9 F (37.7 C) (09/20 2030) Pulse Rate:  [75-89] 75 (09/20 2030) Resp:  [20] 20 (09/20 2030) BP: (142-169)/(55-72) 142/56 (09/20 2030) SpO2:  [96 %-100 %] 96 % (09/20 2030) Last BM Date: 05/25/16  Intake/Output from previous day: 09/19 0701 - 09/20 0700 In: 1450 [P.O.:510; I.V.:600; IV Piggyback:100] Out: -  Intake/Output this shift: No intake/output data recorded.  Lungs: breathing comfortably.  Cardiovascular: irreg irreg  Abd: soft, ATTP, incision c/d/i, J tube with redness around tube and evidence of succous drainage.  Extremities: no edema  Neuro: AOx4  Lab Results: CBC   Recent Labs  05/25/16 0933 05/26/16 0426  WBC 10.2 10.0  HGB 11.1* 9.7*  HCT 33.9* 30.2*  PLT 284 314   BMET  Recent Labs  05/25/16 0933 05/26/16 0426  NA 134* 134*  K 3.5 3.4*  CL 102 100*  CO2 22 26  GLUCOSE 145* 105*  BUN 7 5*  CREATININE 0.64 0.70  CALCIUM 8.1* 8.1*   PT/INR  Recent Labs  05/25/16 0434 05/26/16 0426  LABPROT 20.7* 22.0*  INR 1.76 1.89   ABG No results for input(s): PHART, HCO3 in the last 72 hours.  Invalid input(s): PCO2, PO2  Studies/Results:  Anti-infectives: Anti-infectives    Start     Dose/Rate Route Frequency Ordered Stop   05/23/16 1030  Ampicillin-Sulbactam (UNASYN) 3 g in sodium chloride 0.9 % 100 mL IVPB     3 g 100 mL/hr over 60 Minutes Intravenous Every 8 hours 05/23/16 0932     05/18/16 1600  ceFAZolin (ANCEF) IVPB 2g/100 mL premix     2 g 200 mL/hr over 30 Minutes Intravenous Every 8 hours 05/18/16 1240 05/18/16 1730   05/18/16 0700  ceFAZolin (ANCEF) IVPB 2g/100 mL premix     2 g 200 mL/hr over 30 Minutes Intravenous To  [facility] 05/17/16 1418 05/18/16 0810      Medications: Scheduled Meds: . alum & mag hydroxide-simeth  30 mL Oral Q6H  . ampicillin-sulbactam (UNASYN) IV  3 g Intravenous Q8H  . diltiazem  45 mg Per Tube Q6H  . enoxaparin (LOVENOX) injection  30 mg Subcutaneous Q24H  . feeding supplement (VITAL 1.5 CAL)  1,000 mL Per Tube Q24H  . furosemide  40 mg Oral Daily  . gabapentin  200 mg Per Tube TID  . Gerhardt's butt cream   Topical QID  . levothyroxine  50 mcg Oral QAC breakfast  . mouth rinse  15 mL Mouth Rinse BID  . metoprolol  100 mg Oral BID  . pantoprazole  40 mg Oral Daily  . potassium chloride  20 mEq Oral Daily  . scopolamine  1 patch Transdermal Q72H  . Warfarin - Pharmacist Dosing Inpatient   Does not apply q1800   Continuous Infusions: . bupivacaine 0.25 % ON-Q pump DUAL CATH 400 mL Stopped (05/23/16 0900)  . dextrose 5 % and 0.45 % NaCl with KCl 20 mEq/L 50 mL/hr at 05/26/16 1907   PRN Meds:.acetaminophen, ALPRAZolam, diphenhydrAMINE **OR** diphenhydrAMINE, hydrALAZINE, methocarbamol, morphine injection, nitroGLYCERIN, ondansetron **OR** ondansetron (ZOFRAN) IV, oxyCODONE, traMADol  Assessment/Plan: Patient Active Problem List   Diagnosis Date Noted  . Esophagitis 05/18/2016  . Stomach cancer ([place])  05/18/2016  . Long term (current) use of anticoagulants [Z79.01] 03/19/2016  . No blood products 02/27/2016  . AKI (acute kidney injury) ([place]) 02/17/2016  . S/P partial gastrectomy 02/17/2016  . Post gastrectomy syndrome   . Erosive esophagitis   . Gastroesophageal reflux disease with esophagitis   . Pressure ulcer 02/04/2016  . Gastric cancer ([place]) 01/15/2016  . Persistent atrial fibrillation ([place]) 01/15/2016  . Protein-calorie malnutrition, moderate ([place]) 12/08/2015  . B12 deficiency 11/24/2015  . Iron deficiency anemia due to chronic blood loss 11/24/2015  . Refusal of blood transfusions as patient is Jehovah's Witness   . Adenocarcinoma of stomach ([place])  11/11/2015  . CKD (chronic kidney disease), stage III 11/10/2015  . Vitamin B12 deficiency 11/09/2015  . Peripheral nerve disease ([place]) 07/18/2015  . Rupture patellar tendon 07/29/2014  . Essential hypertension 06/04/2014  . Medication management 02/15/2014  . Prediabetes 09/24/2013  . Vitamin D deficiency 09/24/2013  . Hyperlipemia   . Cardiac pacemaker (06/2010) 08/26/2012  . S/P right TKA 02/07/2012  . FHx/o Colon Cancer 07/27/2011  . Aortic stenosis, moderate 07/09/2011  . S/P repair of patent ductus arteriosus 04/22/2011  . Atrial fibrillation ([place]) Chadsvasc 5 12/02/2010  . Renal Cell Cancer ( 2003) 11/17/2007  . Hypothyroidism 11/17/2007   s/p Procedure(s): EXPLORATORY LAPAROTOMY ROUX EN Y GASTROJEJUNOSTOMY LYSIS OF ADHESION 05/18/2016 -abx for UTI -barrier cream around J tube for skin irritation  Continue full liquids.   Very slow increase in tube feeds to see if we can get up a bit.   Oral meds. Work towards home discharge.     LOS: 8 days   Stark Klein, [facility]

## 2016-05-26 NOTE — Care Management Note (Signed)
Case Management Note Previous CM note initiated by [name], RN 05/21/2016, 10:26 AM    Patient Details  Name: [name] MRN: [id number] Date of Birth: 1933/11/21  Subjective/Objective:      S/p exp lap, roux en y gastrojejunostomy and lysis of adhesion     - pt went into A fib during procedure placed on Cardizem drip.            Action/Plan:  PTA from home with daughters - pt uses 4 prong cane and walker in the home.   Pt is already active [facility].  [facility] is providing the following services: RN, PT, OT and ST - agency is aware of admit.  Pt states she would like to resume services at discharge. CM will request resumption orders prior to discharge via physician sticky notes.     Expected Discharge Date:                  Expected Discharge Plan:  [name]  In-House Referral:     Discharge planning Services  CM Consult  Post Acute Care Choice:  Resumption of Svcs/PTA Provider, Home Health Choice offered to:  Patient  DME Arranged:    DME Agency:     HH Arranged:  RN, PT, OT, Speech Therapy HH Agency:  [place]  Status of Service:  In process, will continue to follow  If discussed at Long Length of Stay Meetings, dates discussed:    Additional Comments:  05/26/16- Marvetta Gibbons RN, CM[facility]- pt will also need TF with orders for discharge. CM will continue to follow.   05/21/16- Per Samantha Claxton-Resumption services ordered.  CM requested that attending order required face to face  Dawayne Patricia, RN 05/26/2016, 10:25 AM [phone]

## 2016-05-27 LAB — COMPREHENSIVE METABOLIC PANEL
ALT: 17 U/L (ref 14–54)
ANION GAP: 8 (ref 5–15)
AST: 18 U/L (ref 15–41)
Albumin: 2.1 g/dL — ABNORMAL LOW (ref 3.5–5.0)
Alkaline Phosphatase: 83 U/L (ref 38–126)
BILIRUBIN TOTAL: 0.4 mg/dL (ref 0.3–1.2)
BUN: <5 mg/dL — ABNORMAL LOW (ref 6–20)
CHLORIDE: 102 mmol/L (ref 101–111)
CO2: 26 mmol/L (ref 22–32)
Calcium: 8.2 mg/dL — ABNORMAL LOW (ref 8.9–10.3)
Creatinine, Ser: 0.68 mg/dL (ref 0.44–1.00)
Glucose, Bld: 105 mg/dL — ABNORMAL HIGH (ref 65–99)
POTASSIUM: 3.6 mmol/L (ref 3.5–5.1)
Sodium: 136 mmol/L (ref 135–145)
TOTAL PROTEIN: 5.4 g/dL — AB (ref 6.5–8.1)

## 2016-05-27 LAB — CBC
HCT: 31.3 % — ABNORMAL LOW (ref 36.0–46.0)
Hemoglobin: 10.1 g/dL — ABNORMAL LOW (ref 12.0–15.0)
MCH: 31.2 pg (ref 26.0–34.0)
MCHC: 32.3 g/dL (ref 30.0–36.0)
MCV: 96.6 fL (ref 78.0–100.0)
PLATELETS: 326 10*3/uL (ref 150–400)
RBC: 3.24 MIL/uL — ABNORMAL LOW (ref 3.87–5.11)
RDW: 15.5 % (ref 11.5–15.5)
WBC: 11.2 10*3/uL — AB (ref 4.0–10.5)

## 2016-05-27 LAB — PROTIME-INR
INR: 1.87
PROTHROMBIN TIME: 21.8 s — AB (ref 11.4–15.2)

## 2016-05-27 MED ORDER — DILTIAZEM HCL ER COATED BEADS 180 MG PO CP24
180.0000 mg | ORAL_CAPSULE | Freq: Every day | ORAL | Status: DC
  Administered 2016-05-27 – 2016-05-31 (×5): 180 mg via ORAL
  Filled 2016-05-27 (×5): qty 1

## 2016-05-27 MED ORDER — WARFARIN SODIUM 4 MG PO TABS
4.0000 mg | ORAL_TABLET | Freq: Once | ORAL | Status: AC
  Administered 2016-05-27: 4 mg via ORAL
  Filled 2016-05-27: qty 1

## 2016-05-27 NOTE — Progress Notes (Signed)
Calorie Count Note  48 hour calorie count ordered.  Diet: Full Liquid Supplements: Vital 1.5 @ 35 ml/hr/hr via j-tube, which provides 1260 kcals, 57 grams protein, 642 ml fluid daily  Noted MD order ordered another 48 hour calorie count. 48 hour calorie count just completed on 05/26/16.   Pt sleeping at time of visit. Spoke with pt daughter at bedside. She confirms that appetite continues to improve; pt is mostly consuming fruit, milk, and ice cream. Tray tickets analyzed for better calorie count.   Vital 1.5 currently infusing via j-tube at 30 ml/hr, which is providing 1080 kcals, 49 grams protein, and 550 ml fluid daily  Day 1 (05/25/16) Breakfast: 128 kcals, 4 grams protein Lunch: 122 kcals, 4 grams protein Dinner: 95 kcals, 2 grams protein Supplements: n/a  Total intake (PO's only): 345 kcal (19% of minimum estimated needs)  10 grams protein (11% of minimum estimated needs)  Total intake (PO's and TF): 1170 kcal (65% of minimum estimated needs)  42 grams protein (44% of minimum estimated needs)  Day 2 (05/26/16) Breakfast: 110 kcals, 3 grams protein Lunch: 118 kcals, 2 grams protein Dinner: 98 kcals, 0 grams protein Supplements: n/a  Total intake (PO's only): 326 kcal (18% of minimum estimated needs)  5 grams protein (5% of minimum estimated needs)  Total intake (PO's and TF): 1226 kcal (68% of minimum estimated needs)  46 grams protein (48% of minimum estimated needs)  Day 3 (05/27/16) Breakfast: 80 kcals, 3 grams protein Lunch: 105 grams protein, 3 grams protein Dinner: not available Supplements: n/a  Total intake (PO's only): 185 kcal (10% of minimum estimated needs)  6 grams protein (6% of minimum estimated needs)  Total intake (PO's and TF): 1265 kcal (70% of minimum estimated needs)  55 grams protein (58% of minimum estimated needs)   Average Total intake (PO's only): 285 kcal (16% of minimum estimated needs)  7 protein (7% of minimum estimated  needs)  Average Total intake (PO's and TF): 1220 kcal (68% of minimum estimated needs)  48 protein (51% of minimum estimated needs)  Nutrition Dx:  Inadequate oral intakerelated to altered GI functionas evidenced by (supplemental TF via J tube); progressing  Goal: Patient will meet greater than or equal to 90% of their needs; unmet  Intervention:   -RD will follow for calorie count results -Continue Vital 1.5 @ 35 ml/hr, which provides 1260 kcals, 57 grams protein, and 645 ml fluid daily (meeting 70% of estimated kcal needs and 60% of estimated protein needs).  -Note: Based on average PO intake data, if pt able to maintain rate of Vital 1.5 @ 35 ml/hr, pt will receive 1545 kcals and 64 grams of protein via PO's and TF rate, meeting 86% of estimated kcal needs and 67% of protein needs. PO intake appears to increase daily   Peyson Postema A. Jimmye Norman, RD, LDN, CDE Pager: [phone] After hours Pager: [phone]

## 2016-05-27 NOTE — Progress Notes (Signed)
Physical Therapy Treatment Patient Details Name: [name] MRN: [id number] DOB: [birth year] Today's Date: 05/27/2016    History of Present Illness The patient is a 82 year old female with gastric cancer. 01/15/16 partial gastrectomy. Pt admitted on 9/12 and underwent Roux en Y gastrojejunostomy with disconnection of antrum from duodenum (whipple). PMH - CAD, Afib, pacer, multiple RLE surgeries, CKD, HTN    PT Comments    Pt pleasant and willing to mobilize despite abdominal pain. Pt educated for transfers, line management, HEP and plan. Will continue to follow and recommend daily mobility with nursing.  Follow Up Recommendations  Home health PT;Supervision/Assistance - 24 hour     Equipment Recommendations       Recommendations for Other Services       Precautions / Restrictions Precautions Precautions: Fall Precaution Comments: j tube Restrictions Weight Bearing Restrictions: No    Mobility  Bed Mobility               General bed mobility comments: EOB on arrival  Transfers Overall transfer level: Needs assistance   Transfers: Sit to/from Stand;Stand Pivot Transfers Sit to Stand: Min guard Stand pivot transfers: Min guard       General transfer comment: cues for hand placement and safety bed>BSC, 2 additional trials at [facility] and chair  Ambulation/Gait Ambulation/Gait assistance: Min guard Ambulation Distance (Feet): 100 Feet Assistive device: Rolling walker (2 wheeled) Gait Pattern/deviations: Step-through pattern;Decreased stride length;Trunk flexed   Gait velocity interpretation: Below normal speed for age/gender General Gait Details: limited by fatigued with cues for position in RW   Stairs            Wheelchair Mobility    Modified Rankin (Stroke Patients Only)       Balance                                    Cognition Arousal/Alertness: Awake/alert Behavior During Therapy: WFL for tasks  assessed/performed Overall Cognitive Status: Within Functional Limits for tasks assessed                      Exercises General Exercises - Lower Extremity Long Arc Quad: AROM;15 reps;Both;Seated Hip ABduction/ADduction: AROM;15 reps;Both;Seated Hip Flexion/Marching: AROM;15 reps;Seated;Both    General Comments        Pertinent Vitals/Pain Pain Score: 8  Pain Location: abdomen Pain Descriptors / Indicators: Aching Pain Intervention(s): Limited activity within patient's tolerance;Monitored during session;RN gave pain meds during session    Home Living                      Prior Function            PT Goals (current goals can now be found in the care plan section) Progress towards PT goals: Progressing toward goals    Frequency           PT Plan Current plan remains appropriate    Co-evaluation             End of Session   Activity Tolerance: Patient tolerated treatment well Patient left: in chair;with call bell/phone within reach;with family/visitor present     Time: 1253-1310 PT Time Calculation (min) (ACUTE ONLY): 17 min  Charges:  $Gait Training: 8-22 mins                    G Codes:      Nancy Nordmann,  Holbert Caples Beth 05/27/2016, 1:58 PM Elwyn Reach, [place]

## 2016-05-27 NOTE — Progress Notes (Signed)
ANTICOAGULATION CONSULT NOTE - Follow Up Consult  Pharmacy Consult:  Coumadin Indication: atrial fibrillation  Allergies  Allergen Reactions  . Amiodarone Other (See Comments)     Thyroid and liver and kidney problems  . Codeine Nausea And Vomiting  . Hydrocodone Other (See Comments)    hallucination  . Morphine And Related Nausea And Vomiting  . Requip [Ropinirole Hcl] Other (See Comments)    Headache   . Zinc Nausea Only    nausea  . Reglan [Metoclopramide] Hives, Itching and Rash    Made face red, also    Patient Measurements: Height: 5\' 1"  (154.9 cm) Weight: 146 lb 12.8 oz (66.6 kg) IBW/kg (Calculated) : 47.8  Vital Signs: Temp: 97.8 F (36.6 C) (09/21 0620) Temp Source: Oral (09/21 0620) BP: 144/56 (09/21 0620) Pulse Rate: 79 (09/21 0620)  Labs:  Recent Labs  05/25/16 0434  05/25/16 0933 05/26/16 0426 05/27/16 0220  HGB  --   < > 11.1* 9.7* 10.1*  HCT  --   --  33.9* 30.2* 31.3*  PLT  --   --  284 314 326  LABPROT 20.7*  --   --  22.0* 21.8*  INR 1.76  --   --  1.89 1.87  CREATININE 0.70  --  0.64 0.70 0.68  < > = values in this interval not displayed.  Estimated Creatinine Clearance: 49 mL/min (by C-G formula based on SCr of 0.68 mg/dL).   Assessment: 11 YOF on Coumadin PTA for history of Afib and CVA.  Currently on Coumadin and Lovenox bridge.  INR remains sub-therapeutic; no bleeding reported.  Home Coumadin regimen:  2 mg daily.    Goal of Therapy:  INR 2-3    Plan:  - Coumadin 4mg  PO today.  Give with applesauce when possible per patient's preference. - Continue Lovenox 30mg  SQ Q24H until INR > 2 - Daily PT / INR   Kariana Wiles D. Mina Marble, PharmD, BCPS Pager:  [phone] 05/27/2016, 7:31 AM

## 2016-05-27 NOTE — Progress Notes (Addendum)
PT Cancellation Note  Patient Details Name: [name] MRN: [id number] DOB: 07-12-1934   Cancelled Treatment:    Reason Eval/Treat Not Completed: Other (comment) (pt bathing at sink with daughter)   Melford Aase 05/27/2016, 11:28 AM Elwyn Reach, [place]

## 2016-05-27 NOTE — Progress Notes (Signed)
Progress Note: General Surgery Service   Subjective: Improved PO intake yesterday, but had trouble with tube feed advance.    Objective: Vital signs in last 24 hours: Temp:  [97.5 F (36.4 C)-99.9 F (37.7 C)] 97.8 F (36.6 C) (09/21 0620) Pulse Rate:  [72-80] 79 (09/21 0620) Resp:  [20] 20 (09/21 0620) BP: (142-169)/(56-72) 144/56 (09/21 0620) SpO2:  [96 %-97 %] 96 % (09/21 0620) Last BM Date: 05/25/16  Intake/Output from previous day: 09/20 0701 - 09/21 0700 In: 1420 [P.O.:120; I.V.:1200; IV Piggyback:100] Out: -  Intake/Output this shift: No intake/output data recorded.  Lungs: breathing comfortably.  Cardiovascular: irreg irreg  Abd: soft, incision c/d/i, J tube with drainage.  Extremities: no edema  Neuro: AOx4  Lab Results: CBC   Recent Labs  05/26/16 0426 05/27/16 0220  WBC 10.0 11.2*  HGB 9.7* 10.1*  HCT 30.2* 31.3*  PLT 314 326   BMET  Recent Labs  05/26/16 0426 05/27/16 0220  NA 134* 136  K 3.4* 3.6  CL 100* 102  CO2 26 26  GLUCOSE 105* 105*  BUN 5* <5*  CREATININE 0.70 0.68  CALCIUM 8.1* 8.2*   PT/INR  Recent Labs  05/26/16 0426 05/27/16 0220  LABPROT 22.0* 21.8*  INR 1.89 1.87   ABG No results for input(s): PHART, HCO3 in the last 72 hours.  Invalid input(s): PCO2, PO2  Studies/Results:  Anti-infectives: Anti-infectives    Start     Dose/Rate Route Frequency Ordered Stop   05/23/16 1030  Ampicillin-Sulbactam (UNASYN) 3 g in sodium chloride 0.9 % 100 mL IVPB     3 g 100 mL/hr over 60 Minutes Intravenous Every 8 hours 05/23/16 0932     05/18/16 1600  ceFAZolin (ANCEF) IVPB 2g/100 mL premix     2 g 200 mL/hr over 30 Minutes Intravenous Every 8 hours 05/18/16 1240 05/18/16 1730   05/18/16 0700  ceFAZolin (ANCEF) IVPB 2g/100 mL premix     2 g 200 mL/hr over 30 Minutes Intravenous To [facility] 05/17/16 1418 05/18/16 0810      Medications: Scheduled Meds: . alum & mag hydroxide-simeth  30 mL Oral Q6H  .  ampicillin-sulbactam (UNASYN) IV  3 g Intravenous Q8H  . diltiazem  45 mg Per Tube Q6H  . enoxaparin (LOVENOX) injection  30 mg Subcutaneous Q24H  . feeding supplement (VITAL 1.5 CAL)  1,000 mL Per Tube Q24H  . furosemide  40 mg Oral Daily  . gabapentin  200 mg Per Tube TID  . Gerhardt's butt cream   Topical QID  . levothyroxine  50 mcg Oral QAC breakfast  . mouth rinse  15 mL Mouth Rinse BID  . metoprolol  100 mg Oral BID  . pantoprazole  40 mg Oral Daily  . potassium chloride  20 mEq Oral Daily  . scopolamine  1 patch Transdermal Q72H  . warfarin  4 mg Oral ONCE-1800  . Warfarin - Pharmacist Dosing Inpatient   Does not apply q1800   Continuous Infusions: . bupivacaine 0.25 % ON-Q pump DUAL CATH 400 mL Stopped (05/23/16 0900)  . dextrose 5 % and 0.45 % NaCl with KCl 20 mEq/L 50 mL/hr at 05/26/16 1907   PRN Meds:.acetaminophen, ALPRAZolam, diphenhydrAMINE **OR** diphenhydrAMINE, hydrALAZINE, methocarbamol, morphine injection, nitroGLYCERIN, ondansetron **OR** ondansetron (ZOFRAN) IV, oxyCODONE, traMADol  Assessment/Plan: Patient Active Problem List   Diagnosis Date Noted  . Esophagitis 05/18/2016  . Stomach cancer ([place]) 05/18/2016  . Long term (current) use of anticoagulants [Z79.01] 03/19/2016  . No blood products  02/27/2016  . AKI (acute kidney injury) ([place]) 02/17/2016  . S/P partial gastrectomy 02/17/2016  . Post gastrectomy syndrome   . Erosive esophagitis   . Gastroesophageal reflux disease with esophagitis   . Pressure ulcer 02/04/2016  . Gastric cancer ([place]) 01/15/2016  . Persistent atrial fibrillation ([place]) 01/15/2016  . Protein-calorie malnutrition, moderate ([place]) 12/08/2015  . B12 deficiency 11/24/2015  . Iron deficiency anemia due to chronic blood loss 11/24/2015  . Refusal of blood transfusions as patient is Jehovah's Witness   . Adenocarcinoma of stomach ([place]) 11/11/2015  . CKD (chronic kidney disease), stage III 11/10/2015  . Vitamin B12 deficiency 11/09/2015   . Peripheral nerve disease ([place]) 07/18/2015  . Rupture patellar tendon 07/29/2014  . Essential hypertension 06/04/2014  . Medication management 02/15/2014  . Prediabetes 09/24/2013  . Vitamin D deficiency 09/24/2013  . Hyperlipemia   . Cardiac pacemaker (06/2010) 08/26/2012  . S/P right TKA 02/07/2012  . FHx/o Colon Cancer 07/27/2011  . Aortic stenosis, moderate 07/09/2011  . S/P repair of patent ductus arteriosus 04/22/2011  . Atrial fibrillation ([place]) Chadsvasc 5 12/02/2010  . Renal Cell Cancer ( 2003) 11/17/2007  . Hypothyroidism 11/17/2007   s/p Procedure(s): EXPLORATORY LAPAROTOMY ROUX EN Y GASTROJEJUNOSTOMY LYSIS OF ADHESION 05/18/2016 -abx for UTI. Stop after last dose today. -barrier cream around J tube for skin irritation  Continue full liquids.   Advance to 35 mL at 2:30 PM, then hold at 35 until tomorrow.   Oral meds. Work towards home discharge.  Would like to at least get 85-90% of caloric needs provided with tube feeds prior to d/c home.  Then plan to advance diet slowly over next 2-3 months.   Coumadin for atrial fibrillation   LOS: 9 days   Khalin Royce, MD  [facility]

## 2016-05-28 LAB — CBC
HCT: 29.4 % — ABNORMAL LOW (ref 36.0–46.0)
Hemoglobin: 9.7 g/dL — ABNORMAL LOW (ref 12.0–15.0)
MCH: 31.4 pg (ref 26.0–34.0)
MCHC: 33 g/dL (ref 30.0–36.0)
MCV: 95.1 fL (ref 78.0–100.0)
PLATELETS: 326 10*3/uL (ref 150–400)
RBC: 3.09 MIL/uL — AB (ref 3.87–5.11)
RDW: 15.5 % (ref 11.5–15.5)
WBC: 11.3 10*3/uL — AB (ref 4.0–10.5)

## 2016-05-28 LAB — COMPREHENSIVE METABOLIC PANEL
ALT: 16 U/L (ref 14–54)
AST: 17 U/L (ref 15–41)
Albumin: 2.2 g/dL — ABNORMAL LOW (ref 3.5–5.0)
Alkaline Phosphatase: 78 U/L (ref 38–126)
Anion gap: 8 (ref 5–15)
BUN: 6 mg/dL (ref 6–20)
CHLORIDE: 103 mmol/L (ref 101–111)
CO2: 24 mmol/L (ref 22–32)
CREATININE: 0.61 mg/dL (ref 0.44–1.00)
Calcium: 8.1 mg/dL — ABNORMAL LOW (ref 8.9–10.3)
Glucose, Bld: 108 mg/dL — ABNORMAL HIGH (ref 65–99)
POTASSIUM: 3.8 mmol/L (ref 3.5–5.1)
SODIUM: 135 mmol/L (ref 135–145)
Total Bilirubin: 0.5 mg/dL (ref 0.3–1.2)
Total Protein: 5.4 g/dL — ABNORMAL LOW (ref 6.5–8.1)

## 2016-05-28 LAB — PROTIME-INR
INR: 2.02
Prothrombin Time: 23.1 seconds — ABNORMAL HIGH (ref 11.4–15.2)

## 2016-05-28 MED ORDER — WARFARIN SODIUM 2 MG PO TABS
2.0000 mg | ORAL_TABLET | Freq: Once | ORAL | Status: AC
  Administered 2016-05-28: 2 mg via ORAL
  Filled 2016-05-28: qty 1

## 2016-05-28 MED ORDER — VITAL 1.5 CAL PO LIQD
1000.0000 mL | ORAL | Status: DC
  Administered 2016-05-29 – 2016-05-30 (×2): 1000 mL
  Filled 2016-05-28 (×4): qty 1000

## 2016-05-28 MED ORDER — GABAPENTIN 100 MG PO CAPS
200.0000 mg | ORAL_CAPSULE | Freq: Three times a day (TID) | ORAL | Status: DC
  Administered 2016-05-28 (×3): 200 mg via ORAL
  Administered 2016-05-29: 100 mg via ORAL
  Filled 2016-05-28 (×4): qty 2

## 2016-05-28 MED ORDER — POTASSIUM CHLORIDE CRYS ER 10 MEQ PO TBCR
20.0000 meq | EXTENDED_RELEASE_TABLET | Freq: Every day | ORAL | Status: DC
  Administered 2016-05-29: 10 meq via ORAL
  Filled 2016-05-28: qty 2

## 2016-05-28 NOTE — Progress Notes (Signed)
Progress Note: General Surgery Service   Subjective: Improved PO intake yesterday, feels full. Apparently there was an incident with a medication in the tube this AM.    Objective: Vital signs in last 24 hours: Temp:  [97.7 F (36.5 C)-98.7 F (37.1 C)] 97.7 F (36.5 C) (09/22 0344) Pulse Rate:  [73-81] 80 (09/22 0344) Resp:  [12-18] 18 (09/22 0344) BP: (154-180)/(54-75) 164/73 (09/22 0344) SpO2:  [96 %-98 %] 96 % (09/22 0344) Last BM Date: 05/26/16  Intake/Output from previous day: 09/21 0701 - 09/22 0700 In: 950 [P.O.:240; I.V.:550; IV Piggyback:100] Out: -  Intake/Output this shift: No intake/output data recorded.  Lungs: breathing comfortably.  Cardiovascular: irreg irreg  Abd: soft, incision c/d/i, J tube with drainage.  Extremities: no edema  Neuro: AOx4  Lab Results: CBC   Recent Labs  05/27/16 0220 05/28/16 0430  WBC 11.2* 11.3*  HGB 10.1* 9.7*  HCT 31.3* 29.4*  PLT 326 326   BMET  Recent Labs  05/27/16 0220 05/28/16 0430  NA 136 135  K 3.6 3.8  CL 102 103  CO2 26 24  GLUCOSE 105* 108*  BUN <5* 6  CREATININE 0.68 0.61  CALCIUM 8.2* 8.1*   PT/INR  Recent Labs  05/27/16 0220 05/28/16 0430  LABPROT 21.8* 23.1*  INR 1.87 2.02   ABG No results for input(s): PHART, HCO3 in the last 72 hours.  Invalid input(s): PCO2, PO2  Studies/Results:  Anti-infectives: Anti-infectives    Start     Dose/Rate Route Frequency Ordered Stop   05/23/16 1030  Ampicillin-Sulbactam (UNASYN) 3 g in sodium chloride 0.9 % 100 mL IVPB     3 g 100 mL/hr over 60 Minutes Intravenous Every 8 hours 05/23/16 0932     05/18/16 1600  ceFAZolin (ANCEF) IVPB 2g/100 mL premix     2 g 200 mL/hr over 30 Minutes Intravenous Every 8 hours 05/18/16 1240 05/18/16 1730   05/18/16 0700  ceFAZolin (ANCEF) IVPB 2g/100 mL premix     2 g 200 mL/hr over 30 Minutes Intravenous To ShortStay Surgical 05/17/16 1418 05/18/16 0810      Medications: Scheduled Meds: . alum &  mag hydroxide-simeth  30 mL Oral Q6H  . ampicillin-sulbactam (UNASYN) IV  3 g Intravenous Q8H  . diltiazem  180 mg Oral Daily  . enoxaparin (LOVENOX) injection  30 mg Subcutaneous Q24H  . feeding supplement (VITAL 1.5 CAL)  1,000 mL Per Tube Q24H  . furosemide  40 mg Oral Daily  . gabapentin  200 mg Per Tube TID  . Gerhardt's butt cream   Topical QID  . levothyroxine  50 mcg Oral QAC breakfast  . mouth rinse  15 mL Mouth Rinse BID  . metoprolol  100 mg Oral BID  . pantoprazole  40 mg Oral Daily  . potassium chloride  20 mEq Oral Daily  . scopolamine  1 patch Transdermal Q72H  . Warfarin - Pharmacist Dosing Inpatient   Does not apply q1800   Continuous Infusions: . bupivacaine 0.25 % ON-Q pump DUAL CATH 400 mL Stopped (05/23/16 0900)  . dextrose 5 % and 0.45 % NaCl with KCl 20 mEq/L 50 mL/hr at 05/27/16 1832   PRN Meds:.acetaminophen, ALPRAZolam, diphenhydrAMINE **OR** diphenhydrAMINE, hydrALAZINE, methocarbamol, morphine injection, nitroGLYCERIN, ondansetron **OR** ondansetron (ZOFRAN) IV, oxyCODONE, traMADol  Assessment/Plan: Patient Active Problem List   Diagnosis Date Noted  . Esophagitis 05/18/2016  . Stomach cancer ([place]) 05/18/2016  . Long term (current) use of anticoagulants [Z79.01] 03/19/2016  . No blood products 02/27/2016  .  AKI (acute kidney injury) ([place]) 02/17/2016  . S/P partial gastrectomy 02/17/2016  . Post gastrectomy syndrome   . Erosive esophagitis   . Gastroesophageal reflux disease with esophagitis   . Pressure ulcer 02/04/2016  . Gastric cancer ([place]) 01/15/2016  . Persistent atrial fibrillation ([place]) 01/15/2016  . Protein-calorie malnutrition, moderate ([place]) 12/08/2015  . B12 deficiency 11/24/2015  . Iron deficiency anemia due to chronic blood loss 11/24/2015  . Refusal of blood transfusions as patient is Jehovah's Witness   . Adenocarcinoma of stomach ([place]) 11/11/2015  . CKD (chronic kidney disease), stage III 11/10/2015  . Vitamin B12 deficiency  11/09/2015  . Peripheral nerve disease ([place]) 07/18/2015  . Rupture patellar tendon 07/29/2014  . Essential hypertension 06/04/2014  . Medication management 02/15/2014  . Prediabetes 09/24/2013  . Vitamin D deficiency 09/24/2013  . Hyperlipemia   . Cardiac pacemaker (06/2010) 08/26/2012  . S/P right TKA 02/07/2012  . FHx/o Colon Cancer 07/27/2011  . Aortic stenosis, moderate 07/09/2011  . S/P repair of patent ductus arteriosus 04/22/2011  . Atrial fibrillation ([place]) Chadsvasc 5 12/02/2010  . Renal Cell Cancer ( 2003) 11/17/2007  . Hypothyroidism 11/17/2007   s/p Procedure(s): EXPLORATORY LAPAROTOMY ROUX EN Y GASTROJEJUNOSTOMY LYSIS OF ADHESION 05/18/2016 -abx for UTI. Stop after last dose today. -barrier cream around J tube for skin irritation  Continue full liquids.    hold tube feeds at 35 mL. Oral meds. Work towards home discharge.  Would like to at least get 85-90% of caloric needs provided with tube feeds prior to d/c home.  Then plan to advance diet slowly over next 2-3 months.   Coumadin for atrial fibrillation   LOS: 10 days   Stark Klein, MD  [facility] Patient ID: [name], female   DOB: [birth year], 82 y.o.   MRN: [id number]

## 2016-05-28 NOTE — Plan of Care (Signed)
Problem: Pain Managment: Goal: General experience of comfort will improve Outcome: Progressing Decreased pain.

## 2016-05-28 NOTE — Progress Notes (Signed)
ANTICOAGULATION CONSULT NOTE - Follow Up Consult  Pharmacy Consult:  Coumadin Indication: atrial fibrillation  Allergies  Allergen Reactions  . Amiodarone Other (See Comments)     Thyroid and liver and kidney problems  . Codeine Nausea And Vomiting  . Hydrocodone Other (See Comments)    hallucination  . Morphine And Related Nausea And Vomiting  . Requip [Ropinirole Hcl] Other (See Comments)    Headache   . Zinc Nausea Only    nausea  . Reglan [Metoclopramide] Hives, Itching and Rash    Made face red, also    Patient Measurements: Height: 5\' 1"  (154.9 cm) Weight: 146 lb 12.8 oz (66.6 kg) IBW/kg (Calculated) : 47.8  Vital Signs: Temp: 97.7 F (36.5 C) (09/22 0344) Temp Source: Oral (09/22 0344) BP: 168/68 (09/22 0926) Pulse Rate: 89 (09/22 0926)  Labs:  Recent Labs  05/26/16 0426 05/27/16 0220 05/28/16 0430  HGB 9.7* 10.1* 9.7*  HCT 30.2* 31.3* 29.4*  PLT 314 326 326  LABPROT 22.0* 21.8* 23.1*  INR 1.89 1.87 2.02  CREATININE 0.70 0.68 0.61    Estimated Creatinine Clearance: 49 mL/min (by C-G formula based on SCr of 0.61 mg/dL).   Assessment: 82yof continues on coumadin and lovenox bridge for hx afib and CVA. INR is therapeutic today at 2.02. Ok to d/c lovenox. CBC is stable. No bleeding.  Home Coumadin regimen:  2 mg daily.    Goal of Therapy:  INR 2-3  Monitor platelets by anticoagulation protocol: yes    Plan:  1) Coumadin 2mg  x 1 - try to get back on home regimen, give with applesauce when possible per patient's preference 2) D/C lovenox 3) Daily INR  Nena Jordan, PharmD, BCPS 05/28/2016, 9:28 AM

## 2016-05-28 NOTE — Progress Notes (Signed)
Calorie Count Note  48 hour calorie count ordered.  Diet: Full Liquid Supplements: Vital 1.5 @ 35 ml/hr/hr via j-tube, which provides 1260 kcals, 57 grams protein, 642 ml fluid daily  Spoke with pt daughter at bedside. She confirms that appetite continues to improve; pt is now eating almost all of her soup. Pt states she is craving broccoli and cheddar soup from [employer].   Pt daughter reports plan is to continue Vital 1.5 via j-tube j-tube at 35 ml/hr, which provides 1260 kcals, 57 grams protein, 642 ml fluid daily. Pt daughter concerned about pt's variable intake when discharging home. Discussed ways to increase calories and protein within full liquid diet. Pt daughter also grateful for assistance of [facility] Staff, with assistance with TF at home.   Day 1 (05/25/16) Breakfast: 128 kcals, 4 grams protein Lunch: 122 kcals, 4 grams protein Dinner: 95 kcals, 2 grams protein Supplements: n/a  Total intake (PO's only): 345 kcal (19% of minimum estimated needs)  10 grams protein (11% of minimum estimated needs)  Total intake (PO's and TF): 1170 kcal (65% of minimum estimated needs)  42 grams protein (44% of minimum estimated needs)  Day 2 (05/26/16) Breakfast: 110 kcals, 3 grams protein Lunch: 118 kcals, 2 grams protein Dinner: 98 kcals, 0 grams protein Supplements: n/a  Total intake (PO's only): 326 kcal (18% of minimum estimated needs)  5 grams protein (5% of minimum estimated needs)  Total intake (PO's and TF): 1226 kcal (68% of minimum estimated needs)  46 grams protein (48% of minimum estimated needs)  Day 3 (05/27/16) Breakfast: 80 kcals, 3 grams protein Lunch: 105 grams protein, 3 grams protein Dinner: 140 kcals, 2 grams protein Supplements: n/a  Total intake (PO's only): 325 kcal (10% of minimum estimated needs)  8 grams protein (6% of minimum estimated needs)  Total intake (PO's and TF): 1585 kcal (88% of minimum estimated needs)  65 grams  protein (68% of minimum estimated needs)  Day 4 (05/27/16) Breakfast: 130 kcals, 1 grams protein Lunch: 140 grams protein, 2 grams protein Dinner: 111 kcals, 1 gram protein Supplements: n/a  Total intake (PO's only): 381 kcal (21% of minimum estimated needs)  4 grams protein (4% of minimum estimated needs)  Total intake (PO's and TF): 1641 kcal (92% of minimum estimated needs)  61 grams protein (64% of minimum estimated needs)  Average Total intake (PO's only): 354 kcal (20% of minimum estimated needs)  7 protein (7% of minimum estimated needs)  Average Total intake (PO's and TF): 1406 kcal (78% of minimum estimated needs)  54 protein (57% of minimum estimated needs)  Nutrition Dx: Inadequate oral intakerelated to altered GI functionas evidenced by (supplemental TF via J tube); progressing  Goal: Patient will meet greater than or equal to 90% of their needs; Met  Intervention:  -D/c calorie count -Continue Vital 1.5 @ 35 ml/hr, which provides 1260 kcals, 57 grams protein, and 645 ml fluid daily (meeting 70% of estimated kcal needs and 60% of estimated protein needs).  -Consider addition of 30 ml Prostat (or equivalent) daily via j-tube. Supplement provides 100 kcals and 15 grams protein.  -Note: Based on average PO intake data, if pt able to maintain rate of Vital 1.5 @ 35 ml/hr, pt will receive 1614 kcals and 67 grams of protein via PO's and TF rate, meeting 90% of estimated kcal needs and 71% of protein needs. PO intake appears to increase daily  Tayvin Preslar A. Mayford Knife, RD, LDN, CDE Pager: [phone] After hours Pager:  [phone]

## 2016-05-29 LAB — COMPREHENSIVE METABOLIC PANEL
ALBUMIN: 2.1 g/dL — AB (ref 3.5–5.0)
ALK PHOS: 73 U/L (ref 38–126)
ALT: 13 U/L — AB (ref 14–54)
ANION GAP: 8 (ref 5–15)
AST: 15 U/L (ref 15–41)
BILIRUBIN TOTAL: 0.4 mg/dL (ref 0.3–1.2)
BUN: 6 mg/dL (ref 6–20)
CALCIUM: 8.5 mg/dL — AB (ref 8.9–10.3)
CO2: 27 mmol/L (ref 22–32)
CREATININE: 0.65 mg/dL (ref 0.44–1.00)
Chloride: 104 mmol/L (ref 101–111)
GFR calc Af Amer: >60 mL/min (ref >60)
GFR calc non Af Amer: >60 mL/min (ref >60)
GLUCOSE: 111 mg/dL — AB (ref 65–99)
Potassium: 4.1 mmol/L (ref 3.5–5.1)
SODIUM: 139 mmol/L (ref 135–145)
TOTAL PROTEIN: 5.3 g/dL — AB (ref 6.5–8.1)

## 2016-05-29 LAB — CBC
HEMATOCRIT: 30.7 % — AB (ref 36.0–46.0)
HEMOGLOBIN: 9.8 g/dL — AB (ref 12.0–15.0)
MCH: 30.9 pg (ref 26.0–34.0)
MCHC: 31.9 g/dL (ref 30.0–36.0)
MCV: 96.8 fL (ref 78.0–100.0)
Platelets: 341 10*3/uL (ref 150–400)
RBC: 3.17 MIL/uL — AB (ref 3.87–5.11)
RDW: 15.7 % — ABNORMAL HIGH (ref 11.5–15.5)
WBC: 11.6 10*3/uL — ABNORMAL HIGH (ref 4.0–10.5)

## 2016-05-29 LAB — PROTIME-INR
INR: 1.99
PROTHROMBIN TIME: 22.9 s — AB (ref 11.4–15.2)

## 2016-05-29 MED ORDER — GABAPENTIN 100 MG PO CAPS
100.0000 mg | ORAL_CAPSULE | Freq: Three times a day (TID) | ORAL | Status: DC
  Administered 2016-05-29 – 2016-05-31 (×7): 100 mg via ORAL
  Filled 2016-05-29 (×7): qty 1

## 2016-05-29 MED ORDER — WARFARIN SODIUM 3 MG PO TABS
3.0000 mg | ORAL_TABLET | Freq: Once | ORAL | Status: AC
Start: 2016-05-29 — End: 2016-05-29
  Administered 2016-05-29: 3 mg via ORAL
  Filled 2016-05-29: qty 1

## 2016-05-29 MED ORDER — POTASSIUM CHLORIDE CRYS ER 10 MEQ PO TBCR
10.0000 meq | EXTENDED_RELEASE_TABLET | Freq: Two times a day (BID) | ORAL | Status: DC
  Filled 2016-05-29: qty 1

## 2016-05-29 NOTE — Progress Notes (Signed)
ANTICOAGULATION CONSULT NOTE - Follow Up Consult  Pharmacy Consult:  Coumadin Indication: atrial fibrillation  Allergies  Allergen Reactions  . Amiodarone Other (See Comments)     Thyroid and liver and kidney problems  . Codeine Nausea And Vomiting  . Hydrocodone Other (See Comments)    hallucination  . Morphine And Related Nausea And Vomiting  . Requip [Ropinirole Hcl] Other (See Comments)    Headache   . Zinc Nausea Only    nausea  . Reglan [Metoclopramide] Hives, Itching and Rash    Made face red, also    Patient Measurements: Height: 5\' 1"  (154.9 cm) Weight: 146 lb 12.8 oz (66.6 kg) IBW/kg (Calculated) : 47.8  Vital Signs: Temp: 98.5 F (36.9 C) (09/23 0432) Temp Source: Oral (09/23 0432) BP: 184/66 (09/23 0432) Pulse Rate: 79 (09/23 0432)  Labs:  Recent Labs  05/27/16 0220 05/28/16 0430 05/29/16 0148  HGB 10.1* 9.7* 9.8*  HCT 31.3* 29.4* 30.7*  PLT 326 326 341  LABPROT 21.8* 23.1* 22.9*  INR 1.87 2.02 1.99  CREATININE 0.68 0.61 0.65    Estimated Creatinine Clearance: 49 mL/min (by C-G formula based on SCr of 0.65 mg/dL).   Assessment: 82yof continues on coumadin and lovenox bridge for hx afib and CVA. INR is approximately therapeutic today at 1.99. CBC is stable. No bleeding documented.  Home Coumadin regimen:  2 mg daily.   Goal of Therapy:  INR 2-3  Monitor platelets by anticoagulation protocol: yes  Plan:  -Coumadin 3mg  x 1 - give with applesauce when possible per patient's preference -Daily INR -Monitor for s/sx bleeding  Elicia Lamp, PharmD, BCPS Clinical Pharmacist Pager [phone] 05/29/2016 11:40 AM

## 2016-05-29 NOTE — Progress Notes (Signed)
Progress Note: General Surgery Service   Subjective: Slow improvement in po intake.  Having nausea in AM for the last few days.  Switched all meds to oral yesterday, but chalky 20 mg potassium tabs are problematic.    Objective: Vital signs in last 24 hours: Temp:  [98.2 F (36.8 C)-99.1 F (37.3 C)] 98.5 F (36.9 C) (09/23 0432) Pulse Rate:  [66-89] 79 (09/23 0432) Resp:  [16-18] 16 (09/23 0432) BP: (153-184)/(56-68) 184/66 (09/23 0432) SpO2:  [95 %-97 %] 97 % (09/23 0432) Last BM Date: 05/28/16  Intake/Output from previous day: 09/22 0701 - 09/23 0700 In: 240 [P.O.:240] Out: -   Lungs: breathing comfortably.  Cardiovascular: irreg irreg  Abd: soft, J tube with drainage.  Extremities: no edema  Neuro: AOx4  Lab Results: CBC   Recent Labs  05/28/16 0430 05/29/16 0148  WBC 11.3* 11.6*  HGB 9.7* 9.8*  HCT 29.4* 30.7*  PLT 326 341   BMET  Recent Labs  05/28/16 0430 05/29/16 0148  NA 135 139  K 3.8 4.1  CL 103 104  CO2 24 27  GLUCOSE 108* 111*  BUN 6 6  CREATININE 0.61 0.65  CALCIUM 8.1* 8.5*   PT/INR  Recent Labs  05/28/16 0430 05/29/16 0148  LABPROT 23.1* 22.9*  INR 2.02 1.99   ABG No results for input(s): PHART, HCO3 in the last 72 hours.  Invalid input(s): PCO2, PO2  Studies/Results:  Anti-infectives: Anti-infectives    Start     Dose/Rate Route Frequency Ordered Stop   05/23/16 1030  Ampicillin-Sulbactam (UNASYN) 3 g in sodium chloride 0.9 % 100 mL IVPB  Status:  Discontinued     3 g 100 mL/hr over 60 Minutes Intravenous Every 8 hours 05/23/16 0932 05/28/16 1447   05/18/16 1600  ceFAZolin (ANCEF) IVPB 2g/100 mL premix     2 g 200 mL/hr over 30 Minutes Intravenous Every 8 hours 05/18/16 1240 05/18/16 1730   05/18/16 0700  ceFAZolin (ANCEF) IVPB 2g/100 mL premix     2 g 200 mL/hr over 30 Minutes Intravenous To ShortStay Surgical 05/17/16 1418 05/18/16 0810      Medications: Scheduled Meds: . alum & mag hydroxide-simeth  30  mL Oral Q6H  . diltiazem  180 mg Oral Daily  . feeding supplement (VITAL 1.5 CAL)  1,000 mL Per Tube Q24H  . furosemide  40 mg Oral Daily  . gabapentin  200 mg Oral TID  . Gerhardt's butt cream   Topical QID  . levothyroxine  50 mcg Oral QAC breakfast  . mouth rinse  15 mL Mouth Rinse BID  . metoprolol  100 mg Oral BID  . pantoprazole  40 mg Oral Daily  . potassium chloride  20 mEq Oral Daily  . scopolamine  1 patch Transdermal Q72H  . Warfarin - Pharmacist Dosing Inpatient   Does not apply q1800   Continuous Infusions:   PRN Meds:.acetaminophen, ALPRAZolam, diphenhydrAMINE **OR** diphenhydrAMINE, hydrALAZINE, methocarbamol, morphine injection, nitroGLYCERIN, ondansetron **OR** ondansetron (ZOFRAN) IV, oxyCODONE, traMADol  Assessment/Plan: Patient Active Problem List   Diagnosis Date Noted  . Esophagitis 05/18/2016  . Stomach cancer ([place]) 05/18/2016  . Long term (current) use of anticoagulants [Z79.01] 03/19/2016  . No blood products 02/27/2016  . AKI (acute kidney injury) ([place]) 02/17/2016  . S/P partial gastrectomy 02/17/2016  . Post gastrectomy syndrome   . Erosive esophagitis   . Gastroesophageal reflux disease with esophagitis   . Pressure ulcer 02/04/2016  . Gastric cancer ([place]) 01/15/2016  . Persistent atrial fibrillation (  [place]) 01/15/2016  . Protein-calorie malnutrition, moderate ([place]) 12/08/2015  . B12 deficiency 11/24/2015  . Iron deficiency anemia due to chronic blood loss 11/24/2015  . Refusal of blood transfusions as patient is Jehovah's Witness   . Adenocarcinoma of stomach ([place]) 11/11/2015  . CKD (chronic kidney disease), stage III 11/10/2015  . Vitamin B12 deficiency 11/09/2015  . Peripheral nerve disease ([place]) 07/18/2015  . Rupture patellar tendon 07/29/2014  . Essential hypertension 06/04/2014  . Medication management 02/15/2014  . Prediabetes 09/24/2013  . Vitamin D deficiency 09/24/2013  . Hyperlipemia   . Cardiac pacemaker (06/2010) 08/26/2012  . S/P  right TKA 02/07/2012  . FHx/o Colon Cancer 07/27/2011  . Aortic stenosis, moderate 07/09/2011  . S/P repair of patent ductus arteriosus 04/22/2011  . Atrial fibrillation ([place]) Chadsvasc 5 12/02/2010  . Renal Cell Cancer ( 2003) 11/17/2007  . Hypothyroidism 11/17/2007   s/p Procedure(s): EXPLORATORY LAPAROTOMY ROUX EN Y GASTROJEJUNOSTOMY LYSIS OF ADHESION 05/18/2016 -UTI - antibiotics off -barrier cream around J tube for skin irritation  Continue full liquids.    hold tube feeds at 35 mL. Oral meds. Change to coated potassium tabs.  Discussed with pharmacist. AM zofran. If stable, will plan d/c Monday with tube feeds at 35 mL/hr. Then plan to advance diet slowly over next 2-3 months.   Coumadin for atrial fibrillation   LOS: 11 days   Hezzie Karim, MD  [facility]

## 2016-05-29 NOTE — Progress Notes (Signed)
Spoke with patient and daughter concerning gabapentin dosage.  Per medication reconciliation patient taking 200 mg TID.  Patient actually takes 100 mg TID.  [name] called and change made in computer.  Family/patient made aware. Pt resting with call bell within reach.  Will continue to monitor. Payton Emerald, RN

## 2016-05-30 LAB — PROTIME-INR
INR: 1.62
Prothrombin Time: 19.4 seconds — ABNORMAL HIGH (ref 11.4–15.2)

## 2016-05-30 MED ORDER — WARFARIN SODIUM 4 MG PO TABS
4.0000 mg | ORAL_TABLET | Freq: Once | ORAL | Status: AC
  Administered 2016-05-30: 4 mg via ORAL
  Filled 2016-05-30: qty 1

## 2016-05-30 MED ORDER — POTASSIUM CHLORIDE 20 MEQ/15ML (10%) PO SOLN
10.0000 meq | Freq: Two times a day (BID) | ORAL | Status: DC
  Administered 2016-05-30 – 2016-05-31 (×3): 10 meq via ORAL
  Filled 2016-05-30 (×3): qty 15

## 2016-05-30 NOTE — Progress Notes (Signed)
12 Days Post-Op  Subjective: Feeling better each day.  Did not tolerate potassium tablet. Has some nausea when she first wakes up.  Objective: Vital signs in last 24 hours: Temp:  [97.8 F (36.6 C)-99.5 F (37.5 C)] 97.8 F (36.6 C) (09/24 0500) Pulse Rate:  [71-83] 77 (09/24 0500) Resp:  [15-16] 16 (09/23 1929) BP: (130-177)/(52-68) 149/68 (09/24 0656) SpO2:  [97 %-98 %] 97 % (09/24 0500) Last BM Date: 05/29/16  Intake/Output from previous day: No intake/output data recorded. Intake/Output this shift: Total I/O In: 240 [P.O.:240] Out: -   PE: General- In NAD Abdomen-soft, incision clean and intact, j-tube in  Lab Results:   Recent Labs  05/28/16 0430 05/29/16 0148  WBC 11.3* 11.6*  HGB 9.7* 9.8*  HCT 29.4* 30.7*  PLT 326 341   BMET  Recent Labs  05/28/16 0430 05/29/16 0148  NA 135 139  K 3.8 4.1  CL 103 104  CO2 24 27  GLUCOSE 108* 111*  BUN 6 6  CREATININE 0.61 0.65  CALCIUM 8.1* 8.5*   PT/INR  Recent Labs  05/29/16 0148 05/30/16 0203  LABPROT 22.9* 19.4*  INR 1.99 1.62   Comprehensive Metabolic Panel:    Component Value Date/Time   NA 139 05/29/2016 0148   NA 135 05/28/2016 0430   NA 143 12/08/2015 1209   NA 143 11/24/2015 1232   K 4.1 05/29/2016 0148   K 3.8 05/28/2016 0430   K 4.2 12/08/2015 1209   K 4.7 11/24/2015 1232   CL 104 05/29/2016 0148   CL 103 05/28/2016 0430   CO2 27 05/29/2016 0148   CO2 24 05/28/2016 0430   CO2 25 12/08/2015 1209   CO2 26 11/24/2015 1232   BUN 6 05/29/2016 0148   BUN 6 05/28/2016 0430   BUN 25.3 12/08/2015 1209   BUN 20.9 11/24/2015 1232   CREATININE 0.65 05/29/2016 0148   CREATININE 0.61 05/28/2016 0430   CREATININE 0.71 04/21/2016 1244   CREATININE 1.1 12/08/2015 1209   CREATININE 1.3 (H) 11/24/2015 1232   GLUCOSE 111 (H) 05/29/2016 0148   GLUCOSE 108 (H) 05/28/2016 0430   GLUCOSE 89 12/08/2015 1209   GLUCOSE 85 11/24/2015 1232   CALCIUM 8.5 (L) 05/29/2016 0148   CALCIUM 8.1 (L)  05/28/2016 0430   CALCIUM 9.2 12/08/2015 1209   CALCIUM 9.4 11/24/2015 1232   AST 15 05/29/2016 0148   AST 17 05/28/2016 0430   AST 16 12/08/2015 1209   AST 15 11/24/2015 1232   ALT 13 (L) 05/29/2016 0148   ALT 16 05/28/2016 0430   ALT 10 12/08/2015 1209   ALT 10 11/24/2015 1232   ALKPHOS 73 05/29/2016 0148   ALKPHOS 78 05/28/2016 0430   ALKPHOS 104 12/08/2015 1209   ALKPHOS 100 11/24/2015 1232   BILITOT 0.4 05/29/2016 0148   BILITOT 0.5 05/28/2016 0430   BILITOT <0.30 12/08/2015 1209   BILITOT 0.44 11/24/2015 1232   PROT 5.3 (L) 05/29/2016 0148   PROT 5.4 (L) 05/28/2016 0430   PROT 7.0 12/08/2015 1209   PROT 7.4 11/24/2015 1232   ALBUMIN 2.1 (L) 05/29/2016 0148   ALBUMIN 2.2 (L) 05/28/2016 0430   ALBUMIN 3.1 (L) 12/08/2015 1209   ALBUMIN 3.1 (L) 11/24/2015 1232     Studies/Results: No results found.  Anti-infectives: Anti-infectives    Start     Dose/Rate Route Frequency Ordered Stop   05/23/16 1030  Ampicillin-Sulbactam (UNASYN) 3 g in sodium chloride 0.9 % 100 mL IVPB  Status:  Discontinued  3 g 100 mL/hr over 60 Minutes Intravenous Every 8 hours 05/23/16 0932 05/28/16 1447   05/18/16 1600  ceFAZolin (ANCEF) IVPB 2g/100 mL premix     2 g 200 mL/hr over 30 Minutes Intravenous Every 8 hours 05/18/16 1240 05/18/16 1730   05/18/16 0700  ceFAZolin (ANCEF) IVPB 2g/100 mL premix     2 g 200 mL/hr over 30 Minutes Intravenous To ShortStay Surgical 05/17/16 1418 05/18/16 0810      Assessment S/p ROUX EN Y GASTROJEJUNOSTOM7 05/18/16-continues to slowly improve   LOS: 12 days   Plan: Switch to potassium solution.  Hopefully home tomorrow.   Shenetta Schnackenberg Lenna Sciara 05/30/2016

## 2016-05-30 NOTE — Progress Notes (Signed)
ANTICOAGULATION CONSULT NOTE - Follow Up Consult  Pharmacy Consult:  Coumadin Indication: atrial fibrillation  Allergies  Allergen Reactions  . Amiodarone Other (See Comments)     Thyroid and liver and kidney problems  . Codeine Nausea And Vomiting  . Hydrocodone Other (See Comments)    hallucination  . Morphine And Related Nausea And Vomiting  . Requip [Ropinirole Hcl] Other (See Comments)    Headache   . Zinc Nausea Only    nausea  . Reglan [Metoclopramide] Hives, Itching and Rash    Made face red, also    Patient Measurements: Height: 5\' 1"  (154.9 cm) Weight: 146 lb 12.8 oz (66.6 kg) IBW/kg (Calculated) : 47.8  Vital Signs: Temp: 97.8 F (36.6 C) (09/24 0500) Temp Source: Oral (09/24 0500) BP: 149/68 (09/24 0656) Pulse Rate: 77 (09/24 0500)  Labs:  Recent Labs  05/28/16 0430 05/29/16 0148 05/30/16 0203  HGB 9.7* 9.8*  --   HCT 29.4* 30.7*  --   PLT 326 341  --   LABPROT 23.1* 22.9* 19.4*  INR 2.02 1.99 1.62  CREATININE 0.61 0.65  --     Estimated Creatinine Clearance: 49 mL/min (by C-G formula based on SCr of 0.65 mg/dL).   Assessment: 82yof continues on coumadin for hx afib and CVA. INR is now below goal at 1.62 after trying to transition back to patient's home regimen. Will resume increased doses. CBC is stable. No bleeding documented.  Home Coumadin regimen:  2 mg daily - Note, patient has NOT been therapeutic on this regimen inpatient.  Goal of Therapy:  INR 2-3  Monitor platelets by anticoagulation protocol: yes  Plan:  -Coumadin 4mg  x 1 - give with applesauce when possible per patient's preference -Daily INR -Monitor for s/sx bleeding  Elicia Lamp, PharmD, BCPS Clinical Pharmacist Pager [phone] 05/30/2016 11:37 AM

## 2016-05-31 LAB — PROTIME-INR
INR: 1.55
PROTHROMBIN TIME: 18.7 s — AB (ref 11.4–15.2)

## 2016-05-31 MED ORDER — GABAPENTIN 100 MG PO CAPS: 100.0000 mg | ORAL_CAPSULE | Freq: Three times a day (TID) | ORAL | 0 refills | Status: DC

## 2016-05-31 MED ORDER — ONDANSETRON 8 MG PO TBDP: 8.0000 mg | ORAL_TABLET | Freq: Three times a day (TID) | ORAL | 3 refills | Status: DC | PRN

## 2016-05-31 MED ORDER — POTASSIUM CHLORIDE 20 MEQ/15ML (10%) PO SOLN: 20.0000 meq | Freq: Every day | ORAL | 0 refills | Status: DC

## 2016-05-31 MED ORDER — LIDOCAINE-EPINEPHRINE 1 %-1:100000 IJ SOLN: 10.0000 mL | Freq: Once | INTRAMUSCULAR | Status: DC

## 2016-05-31 MED ORDER — TRAMADOL HCL 50 MG PO TABS: 50.0000 mg | ORAL_TABLET | Freq: Four times a day (QID) | ORAL | 1 refills | Status: DC | PRN

## 2016-05-31 MED ORDER — LIDOCAINE-EPINEPHRINE 1 %-1:100000 IJ SOLN
10.0000 mL | Freq: Once | INTRAMUSCULAR | Status: DC
  Filled 2016-05-31: qty 10

## 2016-05-31 MED ORDER — WARFARIN SODIUM 5 MG PO TABS: 5.0000 mg | ORAL_TABLET | Freq: Once | ORAL | Status: DC

## 2016-05-31 NOTE — Progress Notes (Signed)
PT Cancellation Note  Patient Details Name: DAMETRA HEFFERN MRN: [id number] DOB: 1933/10/18   Cancelled Treatment:    Reason Eval/Treat Not Completed: Pain limiting ability to participate (pt reports 8/10 abdominal pain and declines mobility at this time)   Melford Aase 05/31/2016, 11:32 AM Elwyn Reach, [place]

## 2016-05-31 NOTE — Progress Notes (Signed)
05/31/2016 11:25 AM [employer] D/C'D.  Counted 30.  Pt tolerated well. [place]

## 2016-05-31 NOTE — Care Management Important Message (Signed)
Important Message  Patient Details  Name: [name] MRN: [id number] Date of Birth: 1934/07/09   Medicare Important Message Given:  Yes    Nathen May 05/31/2016, 10:58 AM

## 2016-05-31 NOTE — Discharge Summary (Signed)
Physician Discharge Summary  Patient ID: [name] MRN: [id number] DOB/AGE: 80-Jul-1935 80 y.o.  Admit date: 05/18/2016 Discharge date: 05/31/2016  Admission Diagnoses: Patient Active Problem List   Diagnosis Date Noted  . Esophagitis 05/18/2016  . Stomach cancer ([place]) 05/18/2016  . Long term (current) use of anticoagulants [Z79.01] 03/19/2016  . No blood products 02/27/2016  . AKI (acute kidney injury) ([place]) 02/17/2016  . S/P partial gastrectomy 02/17/2016  . Post gastrectomy syndrome   . Erosive esophagitis   . Gastroesophageal reflux disease with esophagitis   . Pressure ulcer 02/04/2016  . Gastric cancer ([place]) 01/15/2016  . Persistent atrial fibrillation ([place]) 01/15/2016  . Protein-calorie malnutrition, moderate ([place]) 12/08/2015  . B12 deficiency 11/24/2015  . Iron deficiency anemia due to chronic blood loss 11/24/2015  . Refusal of blood transfusions as patient is Jehovah's Witness   . Adenocarcinoma of stomach ([place]) 11/11/2015  . CKD (chronic kidney disease), stage III 11/10/2015  . Vitamin B12 deficiency 11/09/2015  . Peripheral nerve disease ([place]) 07/18/2015  . Rupture patellar tendon 07/29/2014  . Essential hypertension 06/04/2014  . Medication management 02/15/2014  . Prediabetes 09/24/2013  . Vitamin D deficiency 09/24/2013  . Hyperlipemia   . Cardiac pacemaker (06/2010) 08/26/2012  . S/P right TKA 02/07/2012  . FHx/o Colon Cancer 07/27/2011  . Aortic stenosis, moderate 07/09/2011  . S/P repair of patent ductus arteriosus 04/22/2011  . Atrial fibrillation ([place]) Chadsvasc 5 12/02/2010  . Renal Cell Cancer ( 2003) 11/17/2007  . Hypothyroidism 11/17/2007    Discharge Diagnoses:  Same  Discharged Condition: stable  Hospital Course:  Pt was admitted to the hospital following a roux en j gastrojejunostomy (converted from sleeve-like anatomy for resection of proximal gastric cancer and tubularized reconstruction to the esophagus) with disconnection of  duodenum for continuous retching and refractory esophagitis. After surgery she immediately had cessation of retching.     She had previously been on J tube feeds for full nutrition, but could not get back to her prior rate without significant abdominal distention.  She also could not tolerate diet advance beyond full liquids.  She did well with pain control once we were able to get her off IV narcotics and move to PRN oxycodone elixir.  Eventually, we were able to convert her to PRN tramadol and tylenol for pain control.  We have been able to convert all meds except potassium to oral tablet form.  She was placed back on her coumadin.    She did develop irritation at her J tube site secondary to some leaking.  This was treated with zinc oxide paste.  She was diagnosed with a UTI when she developed some mental status changes and low grade fevers.  This was treated with unasyn.  We have had multiple stable days now with 35 mL/hr of tube feeds and full liquids.  Calorie counts have indicated that she is doing well with around 90-95% of caloric needs met this way.    Staples will be removed prior to d/c.    Consults: physical therapy  Significant Diagnostic Studies: labs: Cr stable prior to d/c.  WBCs down to 11.6k, HCT 30.7.  INR 1.55  Treatments: surgery: see above.    Discharge Exam: Blood pressure (!) 170/67, pulse 75, temperature 98.5 F (36.9 C), temperature source Oral, resp. rate 20, height _0  (1.549 m), weight 66.6 kg (146 lb 12.8 oz), SpO2 98 %. General appearance: alert, cooperative and no distress Resp: breathing comfortably GI: soft, non tender, non distended.  J tube with only one stitch and with mild irritation surrounding.    Disposition: 01-Home or Self Care  Discharge Instructions    Call MD for:  persistant nausea and vomiting    Complete by:  As directed    Call MD for:  redness, tenderness, or signs of infection (pain, swelling, redness, odor or green/yellow discharge  around incision site)    Complete by:  As directed    Call MD for:  severe uncontrolled pain    Complete by:  As directed    Call MD for:  temperature >100.4    Complete by:  As directed    Change dressing (specify)    Complete by:  As directed    Dry dressing daily as needed along with desitin/butt cream around J tube.   Discharge diet:    Complete by:  As directed    Full liquids.   Increase activity slowly    Complete by:  As directed        Medication List    STOP taking these medications   gabapentin 250 MG/5ML solution Commonly known as:  NEURONTIN Replaced by:  gabapentin 100 MG capsule   GAS-X EXTRA STRENGTH 125 MG Caps Generic drug:  Simethicone   ondansetron 4 MG tablet Commonly known as:  ZOFRAN   potassium chloride 10 MEQ tablet Commonly known as:  K-DUR,KLOR-CON   scopolamine 1 MG/3DAYS Commonly known as:  TRANSDERM-SCOP (1.5 MG)   sucralfate 1 GM/10ML suspension Commonly known as:  CARAFATE     TAKE these medications   acetaminophen 325 MG tablet Commonly known as:  TYLENOL Take 1 tablet (325 mg total) by mouth every 6 (six) hours as needed for mild pain, moderate pain, fever or headache.   ALPRAZolam 0.5 MG tablet Commonly known as:  XANAX TAKE 1/2-1 TABLET BY MOUTH 3 TIMES A DAY AS NEEDED FOR ANXIETY   Cyanocobalamin 1000 MCG/ML Kit Inject 1,000 mcg into the skin as directed. What changed:  when to take this   diltiazem 180 MG 24 hr capsule Commonly known as:  CARDIZEM CD Take 1 capsule (180 mg total) by mouth daily.   feeding supplement (VITAL 1.5 CAL) Liqd Place 1,100 mLs into feeding tube daily.   furosemide 40 MG tablet Commonly known as:  LASIX TAKE 1 TABLET (40 MG TOTAL) BY MOUTH DAILY. What changed:  See the new instructions.   gabapentin 100 MG capsule Commonly known as:  NEURONTIN Take 1 capsule (100 mg total) by mouth 3 (three) times daily. Replaces:  gabapentin 250 MG/5ML solution   levothyroxine 100 MCG tablet Commonly  known as:  SYNTHROID, LEVOTHROID TAKE 1/2 TABLET (50 MCG TOTAL) BY MOUTH EVERY DAY ON EMPTY STOMACH WITH A FULL GLASS OF WATER   metoprolol 100 MG tablet Commonly known as:  LOPRESSOR Take 1 tablet (100 mg total) by mouth 2 (two) times daily.   nitroGLYCERIN 0.4 MG SL tablet Commonly known as:  NITROSTAT Place 1 tablet (0.4 mg total) under the tongue every 5 (five) minutes as needed for chest pain (MAX 3 TABLETS).   ondansetron 8 MG disintegrating tablet Commonly known as:  ZOFRAN-ODT Take 1 tablet (8 mg total) by mouth every 8 (eight) hours as needed for nausea.   oxyCODONE 5 MG/5ML solution Commonly known as:  ROXICODONE Place 5 mg into feeding tube every 4 (four) hours as needed for severe pain.   pantoprazole 40 MG tablet Commonly known as:  PROTONIX TAKE 1 TABLET (40 MG TOTAL) BY MOUTH 2 (  TWO) TIMES DAILY.   potassium chloride 20 MEQ/15ML (10%) Soln Take 15 mLs (20 mEq total) by mouth daily. j tube What changed:  additional instructions   ranitidine 150 MG tablet Commonly known as:  ZANTAC Take 150 mg by mouth 2 (two) times daily.   traMADol 50 MG tablet Commonly known as:  ULTRAM Take 1 tablet (50 mg total) by mouth every 6 (six) hours as needed (pain). Reported on 10/14/2015   Vitamin D3 Liqd 30 mLs by Gastric Tube route every 6 (six) hours. 592m   warfarin 2 MG tablet Commonly known as:  COUMADIN TAKE 1 TABLET DAILY OR AS DIRECTED What changed:  See the new instructions.      Follow-up Information    [facility].   Why:  HHRN/PT/OT/SLP resumed for discharge Contact information: [address] [phone]          Signed: BStark Klein9/25/2017, 9:29 AM

## 2016-05-31 NOTE — Care Management Note (Signed)
Case Management Note Previous CM note initiated by [name], RN 05/21/2016, 10:26 AM    Patient Details  Name: [name] MRN: [id number] Date of Birth: [birth year]  Subjective/Objective:      S/p exp lap, roux en y gastrojejunostomy and lysis of adhesion     - pt went into A fib during procedure placed on Cardizem drip.            Action/Plan:  PTA from home with daughters - pt uses 4 prong cane and walker in the home.   Pt is already active [facility] for [facility].  [facility] is providing the following services: RN, PT, OT and ST - agency is aware of admit.  Pt states she would like to resume services at discharge. CM will request resumption orders prior to discharge via physician sticky notes.     Expected Discharge Date:    05/31/16              Expected Discharge Plan:  [place]  In-House Referral:     Discharge planning Services  CM Consult  Post Acute Care Choice:  Resumption of Svcs/PTA Provider, Home Health Choice offered to:  Patient  DME Arranged:    DME Agency:     HH Arranged:  RN, PT, OT, Speech Therapy [place] Agency:  [place]  Status of Service:  Completed, signed off  If discussed at [place] of Stay Meetings, dates discussed:  9/19, 9/21  Additional Comments:  05/31/16- 1400- Marvetta Gibbons RN, CM- pt for d/c home today- notified Santiago Glad with [facility]- orders have already been placed to resume [place]- RN/PT/OT/SLP-pt will resume TF as previously ordered.   05/26/16- Marvetta Gibbons RN, CM- [facility]- pt will also need TF with orders for discharge. CM will continue to follow.   05/21/16- Per Samantha Claxton-Resumption services ordered.  CM requested that attending order required face to face  Dawayne Patricia, RN 05/31/2016, 2:11 PM [phone]

## 2016-05-31 NOTE — Progress Notes (Signed)
ANTICOAGULATION CONSULT NOTE - Follow Up Consult  Pharmacy Consult:  Coumadin Indication: atrial fibrillation  Allergies  Allergen Reactions  . Amiodarone Other (See Comments)     Thyroid and liver and kidney problems  . Codeine Nausea And Vomiting  . Hydrocodone Other (See Comments)    hallucination  . Morphine And Related Nausea And Vomiting  . Requip [Ropinirole Hcl] Other (See Comments)    Headache   . Zinc Nausea Only    nausea  . Reglan [Metoclopramide] Hives, Itching and Rash    Made face red, also   Patient Measurements: Height: 5\' 1"  (154.9 cm) Weight: 146 lb 12.8 oz (66.6 kg) IBW/kg (Calculated) : 47.8  Vital Signs: Temp: 98.5 F (36.9 C) (09/25 0446) Temp Source: Oral (09/25 0446) BP: 170/67 (09/25 0446) Pulse Rate: 75 (09/25 0446)  Labs:  Recent Labs  05/29/16 0148 05/30/16 0203 05/31/16 0436  HGB 9.8*  --   --   HCT 30.7*  --   --   PLT 341  --   --   LABPROT 22.9* 19.4* 18.7*  INR 1.99 1.62 1.55  CREATININE 0.65  --   --    Estimated Creatinine Clearance: 49 mL/min (by C-G formula based on SCr of 0.65 mg/dL).  Assessment: 82yof continues on coumadin for hx afib and CVA. INR is below goal at 1.55. The patient seems to tolerating all medications and has a good appetite. Will increase dose. CBC is stable. No bleeding documented.  Home Coumadin regimen:  2 mg daily - Note, patient has NOT been therapeutic on this regimen inpatient.  Goal of Therapy:  INR 2-3  Monitor platelets by anticoagulation protocol: yes  Plan:  -Coumadin 5mg  x 1 - give with applesauce when possible per patient's preference -Daily INR -Monitor for s/sx bleeding  Georga Bora, PharmD Clinical Pharmacist Pager: [phone] 05/31/2016 8:17 AM

## 2016-06-01 DIAGNOSIS — I130 Hypertensive heart and chronic kidney disease with heart failure and stage 1 through stage 4 chronic kidney disease, or unspecified chronic kidney disease: Secondary | ICD-10-CM | POA: Diagnosis not present

## 2016-06-01 DIAGNOSIS — D649 Anemia, unspecified: Secondary | ICD-10-CM | POA: Diagnosis not present

## 2016-06-01 DIAGNOSIS — Z7901 Long term (current) use of anticoagulants: Secondary | ICD-10-CM | POA: Diagnosis not present

## 2016-06-01 DIAGNOSIS — Z483 Aftercare following surgery for neoplasm: Secondary | ICD-10-CM | POA: Diagnosis not present

## 2016-06-01 DIAGNOSIS — C649 Malignant neoplasm of unspecified kidney, except renal pelvis: Secondary | ICD-10-CM | POA: Diagnosis not present

## 2016-06-01 DIAGNOSIS — Z79891 Long term (current) use of opiate analgesic: Secondary | ICD-10-CM | POA: Diagnosis not present

## 2016-06-01 DIAGNOSIS — K259 Gastric ulcer, unspecified as acute or chronic, without hemorrhage or perforation: Secondary | ICD-10-CM | POA: Diagnosis not present

## 2016-06-01 DIAGNOSIS — I2510 Atherosclerotic heart disease of native coronary artery without angina pectoris: Secondary | ICD-10-CM | POA: Diagnosis not present

## 2016-06-01 DIAGNOSIS — C169 Malignant neoplasm of stomach, unspecified: Secondary | ICD-10-CM | POA: Diagnosis not present

## 2016-06-01 DIAGNOSIS — K210 Gastro-esophageal reflux disease with esophagitis: Secondary | ICD-10-CM | POA: Diagnosis not present

## 2016-06-01 DIAGNOSIS — Z934 Other artificial openings of gastrointestinal tract status: Secondary | ICD-10-CM | POA: Diagnosis not present

## 2016-06-01 DIAGNOSIS — I480 Paroxysmal atrial fibrillation: Secondary | ICD-10-CM | POA: Diagnosis not present

## 2016-06-02 ENCOUNTER — Telehealth: Payer: Self-pay | Admitting: *Deleted

## 2016-06-02 NOTE — Telephone Encounter (Signed)
Daughter called and asked for an RX for a flu vaccine be sent to [facility] in [place] , so the home health nurse can administer the injection.  The [facility] pharmacist states they cannot dispense a flu shot, but they will come to the car and administer the vaccine to the patient.  Beverlee Nims is aware of the situation.

## 2016-06-03 ENCOUNTER — Encounter (HOSPITAL_COMMUNITY): Payer: Self-pay | Admitting: Emergency Medicine

## 2016-06-03 ENCOUNTER — Inpatient Hospital Stay (HOSPITAL_COMMUNITY): Payer: Medicare Other

## 2016-06-03 ENCOUNTER — Inpatient Hospital Stay (HOSPITAL_COMMUNITY)
Admission: EM | Admit: 2016-06-03 | Discharge: 2016-06-13 | DRG: 391 | Disposition: A | Discharge status: Home Health | Payer: Medicare Other | Attending: General Surgery | Admitting: General Surgery

## 2016-06-03 DIAGNOSIS — Z79891 Long term (current) use of opiate analgesic: Secondary | ICD-10-CM | POA: Diagnosis not present

## 2016-06-03 DIAGNOSIS — Z934 Other artificial openings of gastrointestinal tract status: Secondary | ICD-10-CM | POA: Diagnosis not present

## 2016-06-03 DIAGNOSIS — Z8542 Personal history of malignant neoplasm of other parts of uterus: Secondary | ICD-10-CM

## 2016-06-03 DIAGNOSIS — Z91048 Other nonmedicinal substance allergy status: Secondary | ICD-10-CM

## 2016-06-03 DIAGNOSIS — Z23 Encounter for immunization: Secondary | ICD-10-CM | POA: Diagnosis not present

## 2016-06-03 DIAGNOSIS — Z79899 Other long term (current) drug therapy: Secondary | ICD-10-CM

## 2016-06-03 DIAGNOSIS — E43 Unspecified severe protein-calorie malnutrition: Secondary | ICD-10-CM | POA: Diagnosis present

## 2016-06-03 DIAGNOSIS — Z85528 Personal history of other malignant neoplasm of kidney: Secondary | ICD-10-CM | POA: Diagnosis not present

## 2016-06-03 DIAGNOSIS — Z905 Acquired absence of kidney: Secondary | ICD-10-CM

## 2016-06-03 DIAGNOSIS — I130 Hypertensive heart and chronic kidney disease with heart failure and stage 1 through stage 4 chronic kidney disease, or unspecified chronic kidney disease: Secondary | ICD-10-CM | POA: Diagnosis not present

## 2016-06-03 DIAGNOSIS — B961 Klebsiella pneumoniae [K. pneumoniae] as the cause of diseases classified elsewhere: Secondary | ICD-10-CM | POA: Diagnosis present

## 2016-06-03 DIAGNOSIS — Z09 Encounter for follow-up examination after completed treatment for conditions other than malignant neoplasm: Secondary | ICD-10-CM | POA: Diagnosis not present

## 2016-06-03 DIAGNOSIS — H548 Legal blindness, as defined in USA: Secondary | ICD-10-CM | POA: Diagnosis present

## 2016-06-03 DIAGNOSIS — C169 Malignant neoplasm of stomach, unspecified: Secondary | ICD-10-CM | POA: Diagnosis not present

## 2016-06-03 DIAGNOSIS — M6250 Muscle wasting and atrophy, not elsewhere classified, unspecified site: Secondary | ICD-10-CM | POA: Diagnosis not present

## 2016-06-03 DIAGNOSIS — Z923 Personal history of irradiation: Secondary | ICD-10-CM

## 2016-06-03 DIAGNOSIS — K651 Peritoneal abscess: Secondary | ICD-10-CM | POA: Diagnosis not present

## 2016-06-03 DIAGNOSIS — Z96651 Presence of right artificial knee joint: Secondary | ICD-10-CM | POA: Diagnosis present

## 2016-06-03 DIAGNOSIS — Z888 Allergy status to other drugs, medicaments and biological substances status: Secondary | ICD-10-CM

## 2016-06-03 DIAGNOSIS — D638 Anemia in other chronic diseases classified elsewhere: Secondary | ICD-10-CM | POA: Diagnosis present

## 2016-06-03 DIAGNOSIS — D6832 Hemorrhagic disorder due to extrinsic circulating anticoagulants: Secondary | ICD-10-CM | POA: Diagnosis not present

## 2016-06-03 DIAGNOSIS — K30 Functional dyspepsia: Secondary | ICD-10-CM | POA: Diagnosis not present

## 2016-06-03 DIAGNOSIS — R109 Unspecified abdominal pain: Secondary | ICD-10-CM | POA: Diagnosis not present

## 2016-06-03 DIAGNOSIS — Z800 Family history of malignant neoplasm of digestive organs: Secondary | ICD-10-CM

## 2016-06-03 DIAGNOSIS — K259 Gastric ulcer, unspecified as acute or chronic, without hemorrhage or perforation: Secondary | ICD-10-CM | POA: Diagnosis not present

## 2016-06-03 DIAGNOSIS — K210 Gastro-esophageal reflux disease with esophagitis: Secondary | ICD-10-CM | POA: Diagnosis not present

## 2016-06-03 DIAGNOSIS — N189 Chronic kidney disease, unspecified: Secondary | ICD-10-CM | POA: Diagnosis present

## 2016-06-03 DIAGNOSIS — Z8673 Personal history of transient ischemic attack (TIA), and cerebral infarction without residual deficits: Secondary | ICD-10-CM

## 2016-06-03 DIAGNOSIS — L0231 Cutaneous abscess of buttock: Secondary | ICD-10-CM | POA: Diagnosis not present

## 2016-06-03 DIAGNOSIS — Z950 Presence of cardiac pacemaker: Secondary | ICD-10-CM

## 2016-06-03 DIAGNOSIS — Z85028 Personal history of other malignant neoplasm of stomach: Secondary | ICD-10-CM

## 2016-06-03 DIAGNOSIS — D649 Anemia, unspecified: Secondary | ICD-10-CM | POA: Diagnosis not present

## 2016-06-03 DIAGNOSIS — Z885 Allergy status to narcotic agent status: Secondary | ICD-10-CM

## 2016-06-03 DIAGNOSIS — I4891 Unspecified atrial fibrillation: Secondary | ICD-10-CM | POA: Diagnosis not present

## 2016-06-03 DIAGNOSIS — Z7901 Long term (current) use of anticoagulants: Secondary | ICD-10-CM

## 2016-06-03 DIAGNOSIS — B379 Candidiasis, unspecified: Secondary | ICD-10-CM | POA: Diagnosis not present

## 2016-06-03 DIAGNOSIS — K219 Gastro-esophageal reflux disease without esophagitis: Secondary | ICD-10-CM | POA: Diagnosis present

## 2016-06-03 DIAGNOSIS — Z8249 Family history of ischemic heart disease and other diseases of the circulatory system: Secondary | ICD-10-CM

## 2016-06-03 DIAGNOSIS — IMO0002 Reserved for concepts with insufficient information to code with codable children: Secondary | ICD-10-CM

## 2016-06-03 DIAGNOSIS — E440 Moderate protein-calorie malnutrition: Secondary | ICD-10-CM

## 2016-06-03 DIAGNOSIS — Z6828 Body mass index (BMI) 28.0-28.9, adult: Secondary | ICD-10-CM

## 2016-06-03 DIAGNOSIS — C649 Malignant neoplasm of unspecified kidney, except renal pelvis: Secondary | ICD-10-CM | POA: Diagnosis not present

## 2016-06-03 DIAGNOSIS — I480 Paroxysmal atrial fibrillation: Secondary | ICD-10-CM | POA: Diagnosis not present

## 2016-06-03 DIAGNOSIS — R1909 Other intra-abdominal and pelvic swelling, mass and lump: Secondary | ICD-10-CM | POA: Diagnosis not present

## 2016-06-03 DIAGNOSIS — I2510 Atherosclerotic heart disease of native coronary artery without angina pectoris: Secondary | ICD-10-CM | POA: Diagnosis present

## 2016-06-03 DIAGNOSIS — K910 Vomiting following gastrointestinal surgery: Secondary | ICD-10-CM | POA: Diagnosis not present

## 2016-06-03 DIAGNOSIS — B952 Enterococcus as the cause of diseases classified elsewhere: Secondary | ICD-10-CM | POA: Diagnosis present

## 2016-06-03 DIAGNOSIS — Z90710 Acquired absence of both cervix and uterus: Secondary | ICD-10-CM | POA: Diagnosis not present

## 2016-06-03 DIAGNOSIS — I4819 Other persistent atrial fibrillation: Secondary | ICD-10-CM

## 2016-06-03 DIAGNOSIS — L02211 Cutaneous abscess of abdominal wall: Secondary | ICD-10-CM | POA: Diagnosis not present

## 2016-06-03 DIAGNOSIS — Z483 Aftercare following surgery for neoplasm: Secondary | ICD-10-CM | POA: Diagnosis not present

## 2016-06-03 LAB — CBC WITH DIFFERENTIAL/PLATELET
BASOS ABS: 0 10*3/uL (ref 0.0–0.1)
BASOS PCT: 0 %
EOS ABS: 0.1 10*3/uL (ref 0.0–0.7)
Eosinophils Relative: 1 %
HEMATOCRIT: 32.1 % — AB (ref 36.0–46.0)
Hemoglobin: 10.2 g/dL — ABNORMAL LOW (ref 12.0–15.0)
Lymphocytes Relative: 10 %
Lymphs Abs: 1 10*3/uL (ref 0.7–4.0)
MCH: 30.8 pg (ref 26.0–34.0)
MCHC: 31.8 g/dL (ref 30.0–36.0)
MCV: 97 fL (ref 78.0–100.0)
MONO ABS: 0.8 10*3/uL (ref 0.1–1.0)
MONOS PCT: 8 %
NEUTROS ABS: 8.1 10*3/uL — AB (ref 1.7–7.7)
NEUTROS PCT: 81 %
Platelets: 393 10*3/uL (ref 150–400)
RBC: 3.31 MIL/uL — ABNORMAL LOW (ref 3.87–5.11)
RDW: 15.5 % (ref 11.5–15.5)
WBC: 10 10*3/uL (ref 4.0–10.5)

## 2016-06-03 LAB — COMPREHENSIVE METABOLIC PANEL
ALBUMIN: 2.3 g/dL — AB (ref 3.5–5.0)
ALK PHOS: 90 U/L (ref 38–126)
ALT: 19 U/L (ref 14–54)
ANION GAP: 7 (ref 5–15)
AST: 34 U/L (ref 15–41)
BUN: 12 mg/dL (ref 6–20)
CHLORIDE: 102 mmol/L (ref 101–111)
CO2: 26 mmol/L (ref 22–32)
Calcium: 8.4 mg/dL — ABNORMAL LOW (ref 8.9–10.3)
Creatinine, Ser: 0.63 mg/dL (ref 0.44–1.00)
GFR calc Af Amer: >60 mL/min (ref >60)
GFR calc non Af Amer: >60 mL/min (ref >60)
Glucose, Bld: 95 mg/dL (ref 65–99)
Potassium: 5 mmol/L (ref 3.5–5.1)
SODIUM: 135 mmol/L (ref 135–145)
Total Bilirubin: 0.9 mg/dL (ref 0.3–1.2)
Total Protein: 5.7 g/dL — ABNORMAL LOW (ref 6.5–8.1)

## 2016-06-03 LAB — NO BLOOD PRODUCTS

## 2016-06-03 LAB — LIPASE, BLOOD: LIPASE: 44 U/L (ref 11–51)

## 2016-06-03 MED ORDER — IOPAMIDOL (ISOVUE-300) INJECTION 61%
INTRAVENOUS | Status: AC
  Administered 2016-06-03: 17:00:00
  Filled 2016-06-03: qty 30

## 2016-06-03 MED ORDER — KCL IN DEXTROSE-NACL 20-5-0.45 MEQ/L-%-% IV SOLN
INTRAVENOUS | Status: DC
  Administered 2016-06-03 – 2016-06-12 (×11): via INTRAVENOUS
  Filled 2016-06-03 (×16): qty 1000

## 2016-06-03 MED ORDER — ACETAMINOPHEN 160 MG/5ML PO SOLN
320.0000 mg | Freq: Every day | ORAL | Status: DC | PRN
  Administered 2016-06-05 – 2016-06-06 (×2): 320 mg via ORAL
  Filled 2016-06-03 (×2): qty 20.3

## 2016-06-03 MED ORDER — POTASSIUM CHLORIDE 20 MEQ/15ML (10%) PO SOLN
20.0000 meq | Freq: Every day | ORAL | Status: DC
  Administered 2016-06-04 – 2016-06-13 (×9): 20 meq via ORAL
  Filled 2016-06-03 (×9): qty 15

## 2016-06-03 MED ORDER — LEVOTHYROXINE SODIUM 50 MCG PO TABS
50.0000 ug | ORAL_TABLET | Freq: Every day | ORAL | Status: DC
  Administered 2016-06-04 – 2016-06-13 (×9): 50 ug via ORAL
  Filled 2016-06-03 (×9): qty 1

## 2016-06-03 MED ORDER — METOPROLOL TARTRATE 100 MG PO TABS
100.0000 mg | ORAL_TABLET | Freq: Two times a day (BID) | ORAL | Status: DC
  Administered 2016-06-04 – 2016-06-13 (×20): 100 mg via ORAL
  Filled 2016-06-03 (×21): qty 1

## 2016-06-03 MED ORDER — IOPAMIDOL (ISOVUE-300) INJECTION 61%
100.0000 mL | Freq: Once | INTRAVENOUS | Status: AC | PRN
  Administered 2016-06-03: 100 mL via INTRAVENOUS

## 2016-06-03 MED ORDER — ONDANSETRON HCL 4 MG/2ML IJ SOLN
4.0000 mg | Freq: Once | INTRAMUSCULAR | Status: AC
  Administered 2016-06-03: 4 mg via INTRAVENOUS
  Filled 2016-06-03: qty 2

## 2016-06-03 MED ORDER — FAMOTIDINE 20 MG PO TABS
20.0000 mg | ORAL_TABLET | Freq: Two times a day (BID) | ORAL | Status: DC
  Administered 2016-06-03 – 2016-06-13 (×20): 20 mg via ORAL
  Filled 2016-06-03 (×20): qty 1

## 2016-06-03 MED ORDER — NITROGLYCERIN 0.4 MG SL SUBL: 0.4000 mg | SUBLINGUAL_TABLET | SUBLINGUAL | Status: DC | PRN

## 2016-06-03 MED ORDER — LEVOTHYROXINE SODIUM 50 MCG PO TABS: 50.0000 ug | ORAL_TABLET | Freq: Every day | ORAL | Status: DC

## 2016-06-03 MED ORDER — FUROSEMIDE 20 MG PO TABS
20.0000 mg | ORAL_TABLET | Freq: Every day | ORAL | Status: DC
  Administered 2016-06-04 – 2016-06-13 (×10): 20 mg via ORAL
  Filled 2016-06-03 (×10): qty 1

## 2016-06-03 MED ORDER — IOPAMIDOL (ISOVUE-300) INJECTION 61%
INTRAVENOUS | Status: AC
  Administered 2016-06-03: 18:00:00
  Filled 2016-06-03: qty 100

## 2016-06-03 MED ORDER — WARFARIN SODIUM 2.5 MG PO TABS
2.5000 mg | ORAL_TABLET | Freq: Every day | ORAL | Status: DC
  Administered 2016-06-03: 2.5 mg via ORAL
  Filled 2016-06-03: qty 1

## 2016-06-03 MED ORDER — DILTIAZEM HCL ER COATED BEADS 180 MG PO CP24
180.0000 mg | ORAL_CAPSULE | Freq: Every day | ORAL | Status: DC
  Administered 2016-06-04 – 2016-06-13 (×10): 180 mg via ORAL
  Filled 2016-06-03 (×11): qty 1

## 2016-06-03 MED ORDER — ALPRAZOLAM 0.25 MG PO TABS: 0.2500 mg | ORAL_TABLET | Freq: Three times a day (TID) | ORAL | Status: DC | PRN

## 2016-06-03 MED ORDER — WARFARIN - PHYSICIAN DOSING INPATIENT: Freq: Every day | Status: DC

## 2016-06-03 MED ORDER — GABAPENTIN 250 MG/5ML PO SOLN
100.0000 mg | Freq: Three times a day (TID) | ORAL | Status: DC
Start: 2016-06-03 — End: 2016-06-13
  Administered 2016-06-03 – 2016-06-13 (×29): 100 mg
  Filled 2016-06-03 (×32): qty 2

## 2016-06-03 MED ORDER — ONDANSETRON 4 MG PO TBDP
8.0000 mg | ORAL_TABLET | Freq: Three times a day (TID) | ORAL | Status: DC | PRN
  Administered 2016-06-04: 8 mg via ORAL
  Filled 2016-06-03: qty 2

## 2016-06-03 MED ORDER — TRAMADOL HCL 50 MG PO TABS
50.0000 mg | ORAL_TABLET | Freq: Four times a day (QID) | ORAL | Status: DC | PRN
  Administered 2016-06-03 – 2016-06-06 (×8): 50 mg via ORAL
  Filled 2016-06-03 (×8): qty 1

## 2016-06-03 MED ORDER — OXYCODONE HCL 5 MG/5ML PO SOLN
5.0000 mg | ORAL | Status: DC | PRN
  Administered 2016-06-04: 5 mg
  Filled 2016-06-03 (×4): qty 5

## 2016-06-03 NOTE — ED Notes (Signed)
[name] at bedside to assess patient.

## 2016-06-03 NOTE — H&P (Signed)
[name] is an 82 y.o. female.   Chief Complaint: nausea, vomiting, distention HPI:  Pt is an 82 yo F dx gastric cancer Jan 2017 when she was admitted for pneumonia and anemia.  Is also anticoagulated and a jehovah's witness.  Has history of neuro event when off anticoagulation for prolonged period of time.  Had long discussion of risks of surgery.  Did proximal gastrectomy with sleeve type reconstruction of remnant distal stomach May 2017.  Unfortunately she developed unremitting retching.  Then placed J tube in June 2017 to get her off TNA.  Still had retching and esophagitis with pain that was refractory to treatment.  Converted to Roux en y gastrojejunostomy and disconnected duodenum 05/18/16.  Retching stopped immediately.  Was doing well and went home Monday.  She did develop a small amount of irritation around the J tube site with some leakage that was treated with zn oxide paste.  Over the last 24-48 hours she has had n/v and significant abdominal distention.  She is still passing gas and having her normal loose BMs that she has had for years.  No f/c.  She had some redness around her J tube yesterday that [facility] RN marked in black marker that is improved today.  Past Medical History:  Diagnosis Date  . Adenocarcinoma of stomach ([name]) 11/11/2015   gastric mass on egd  . Anal fissure   . Anemia    hx 3/17 iron infusion, on aranesp and injected B12 since 11/2015.   [name] Anxiety   . Aortic root dilatation ([name])   . Arthritis   . 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 CAD (coronary artery disease)   . Cancer ([name])    hx Kidney cancer / hx of endometrial cancer  . CHF (congestive heart failure) ([place])   . Chronic kidney disease    only has one kidney rt lft rem 03 ca     dr. Risa Grill  . Elevated LFTs    hepatic steatosis  . Gastroparesis   . GERD (gastroesophageal reflux disease)   . History of uterine cancer 1980s   treated with hysterectomy, external radiation and radiation seed implants.   [name] HTN  (hypertension)   . Hyperlipemia   . Hypothyroidism   . Moderate aortic stenosis   . Neuromuscular disorder ([facility])    neuropathy in right leg  . Neuropathy ([place])    feet/legs  . No blood products 02/27/2016  . Osteomyelitis ([place]) as child  . PAF (paroxysmal atrial fibrillation) ([place])   . PONV (postoperative nausea and vomiting)   . Presence of permanent cardiac pacemaker   . Radiation proctitis   . Restless leg syndrome   . Stroke [facility]) 17   partial stroke left legally blind  . Tachycardia-bradycardia syndrome ([place])    s/p PPM by Dr [name] (MDT) 07/03/10    Past Surgical History:  Procedure Laterality Date  . CARDIAC CATHETERIZATION  2008  . CHOLECYSTECTOMY  1970s  . COLONOSCOPY N/A 11/11/2015   Procedure: COLONOSCOPY;  Surgeon: Valerie Mayer, MD;  Location: [facility];  Service: Endoscopy;  Laterality: N/A;  . ESOPHAGOGASTRODUODENOSCOPY N/A 11/11/2015   Procedure: ESOPHAGOGASTRODUODENOSCOPY (EGD);  Surgeon: Valerie Mayer, MD;  Location: [facility];  Service: Endoscopy;  Laterality: N/A;  . ESOPHAGOGASTRODUODENOSCOPY N/A 02/05/2016   Procedure: ESOPHAGOGASTRODUODENOSCOPY (EGD);  Surgeon: Valerie Artist, MD;  Location: [facility] ENDOSCOPY;  Service: Endoscopy;  Laterality: N/A;  . ESOPHAGOGASTRODUODENOSCOPY N/A 04/02/2016   Procedure: ESOPHAGOGASTRODUODENOSCOPY (EGD);  Surgeon: Valerie Mayer, MD;  [facility]: [facility];  Service: Endoscopy;  Laterality: N/A;  . GASTRECTOMY N/A 01/15/2016   Procedure: OPEN PARTIAL GASTRECTOMY;  Surgeon: Valerie Klein, MD;  Location: [facility];  Service: General;  Laterality: N/A;  . GASTRECTOMY Left 02/24/2016   Procedure: OPEN J TUBE;  Surgeon: Valerie Klein, MD;  Location: [place];  Service: General;  Laterality: Left;  [name] GASTROJEJUNOSTOMY N/A 05/18/2016   Procedure: ROUX EN Y GASTROJEJUNOSTOMY;  Surgeon: Valerie Klein, MD;  Location: [facility];  Service: General;  Laterality: N/A;  . I&D KNEE WITH POLY EXCHANGE Right 12/17/2013   Procedure: IRRIGATION AND DEBRIDEMEN  RIGHT TOTAL  KNEE WITH POLY EXCHANGE;  Surgeon: Valerie Pole, MD;  Location: [facility];  Service: Orthopedics;  Laterality: Right;  . LAPAROSCOPY N/A 01/15/2016   Procedure: LAPAROSCOPY DIAGNOSTIC;  Surgeon: Valerie Klein, MD;  Location: [facility];  Service: General;  Laterality: N/A;  . LYSIS OF ADHESION  05/18/2016   Procedure: LYSIS OF ADHESION;  Surgeon: Valerie Klein, MD;  Location: [place];  Service: General;;  . NEPHRECTOMY Left    renal cell cancer  . PACEMAKER INSERTION  07/04/11   MDT PPM implanted by Dr [name] for tachy/brady  . PATELLAR TENDON REPAIR Right 03/06/2014   Procedure: AVULSION WITH PATELLA TENDON REPAIR;  Surgeon: Valerie Pole, MD;  [facility]: [facility];  Service: Orthopedics;  Laterality: Right;  . PATENT DUCTUS ARTERIOUS REPAIR  ~ 1949  . TOTAL ABDOMINAL HYSTERECTOMY  85  . TOTAL HIP REVISION Right 2009   initial replacment surg  . TOTAL KNEE ARTHROPLASTY  02/07/2012   Procedure: TOTAL KNEE ARTHROPLASTY;  Surgeon: Valerie Pole, MD;  [facility]: [facility];  Service: Orthopedics;  Laterality: Right;  . TOTAL KNEE REVISION Right 07/29/2014   Procedure: RIGHT KNEE REVISION OF PREVIOUS REPAIR EXTENSOR MECHANISM;  Surgeon: Valerie Pole, MD;  Location: [facility];  Service: Orthopedics;  Laterality: Right;  . UPPER GASTROINTESTINAL ENDOSCOPY    . WHIPPLE PROCEDURE N/A 05/18/2016   Procedure: EXPLORATORY LAPAROTOMY;  Surgeon: Valerie Klein, MD;  [facility]: [facility];  Service: General;  Laterality: N/A;    Family History  Problem Relation Age of Onset  . Colon cancer Mother 42  . Heart disease Mother   . Heart disease Father   . Heart attack Father   . Heart disease Maternal Grandmother    Social History:  reports that she has never smoked. She has never used smokeless tobacco. She reports that she does not drink alcohol or use drugs.  Allergies:  Allergies  Allergen Reactions  . Adhesive [Tape] Other (See Comments)    Tears skin, Please use "paper" tape  . Amiodarone Other (See  Comments)     Thyroid and liver and kidney problems  . Codeine Nausea And Vomiting  . Hydrocodone Other (See Comments)    hallucination  . Morphine And Related Nausea And Vomiting  . Requip [Ropinirole Hcl] Other (See Comments)    Headache   . Zinc Nausea Only    nausea  . Reglan [Metoclopramide] Hives, Itching and Rash    Made face red, also     (Not in a hospital admission)  Results for orders placed or performed during the hospital encounter of 06/03/16 (from the past 48 hour(s))  Comprehensive metabolic panel     Status: Abnormal   Collection Time: 06/03/16  3:39 PM  Result Value Ref Range   Sodium 135 135 - 145 mmol/L   Potassium 5.0 3.5 - 5.1 mmol/L    Comment: SPECIMEN  HEMOLYZED. HEMOLYSIS MAY AFFECT INTEGRITY OF RESULTS.   Chloride 102 101 - 111 mmol/L   CO2 26 22 - 32 mmol/L   Glucose, Bld 95 65 - 99 mg/dL   BUN 12 6 - 20 mg/dL   Creatinine, Ser 0.63 0.44 - 1.00 mg/dL   Calcium 8.4 (L) 8.9 - 10.3 mg/dL   Total Protein 5.7 (L) 6.5 - 8.1 g/dL   Albumin 2.3 (L) 3.5 - 5.0 g/dL   AST 34 15 - 41 U/L   ALT 19 14 - 54 U/L   Alkaline Phosphatase 90 38 - 126 U/L   Total Bilirubin 0.9 0.3 - 1.2 mg/dL   GFR calc non Af Amer >60 >60 mL/min   GFR calc Af Amer >60 >60 mL/min    Comment: (NOTE) The eGFR has been calculated using the CKD EPI equation. This calculation has not been validated in all clinical situations. eGFR's persistently <60 mL/min signify possible Chronic Kidney Disease.    Anion gap 7 5 - 15  Lipase, blood     Status: None   Collection Time: 06/03/16  3:39 PM  Result Value Ref Range   Lipase 44 11 - 51 U/L  CBC with Differential     Status: Abnormal   Collection Time: 06/03/16  3:39 PM  Result Value Ref Range   WBC 10.0 4.0 - 10.5 K/uL   RBC 3.31 (L) 3.87 - 5.11 MIL/uL   Hemoglobin 10.2 (L) 12.0 - 15.0 g/dL   HCT 32.1 (L) 36.0 - 46.0 %   MCV 97.0 78.0 - 100.0 fL   MCH 30.8 26.0 - 34.0 pg   MCHC 31.8 30.0 - 36.0 g/dL   RDW 15.5 11.5 - 15.5 %    Platelets 393 150 - 400 K/uL   Neutrophils Relative % 81 %   Neutro Abs 8.1 (H) 1.7 - 7.7 K/uL   Lymphocytes Relative 10 %   Lymphs Abs 1.0 0.7 - 4.0 K/uL   Monocytes Relative 8 %   Monocytes Absolute 0.8 0.1 - 1.0 K/uL   Eosinophils Relative 1 %   Eosinophils Absolute 0.1 0.0 - 0.7 K/uL   Basophils Relative 0 %   Basophils Absolute 0.0 0.0 - 0.1 K/uL   No results found.  Review of Systems  Constitutional: Negative.   HENT: Negative.   Eyes: Negative.   Respiratory: Negative.   Cardiovascular: Negative.   Gastrointestinal: Positive for abdominal pain, diarrhea (chronic diarrhea, no change.), nausea and vomiting.  Genitourinary: Negative.   Musculoskeletal: Negative.   Skin: Negative.   Neurological: Negative.   Endo/Heme/Allergies: Negative.   Psychiatric/Behavioral: Negative.     Blood pressure 144/82, pulse 74, temperature 98.7 F (37.1 C), temperature source Oral, resp. rate 18, SpO2 97 %. Physical Exam  Constitutional: She is oriented to person, place, and time. She appears well-developed and well-nourished. No distress.  HENT:  Head: Normocephalic and atraumatic.  Eyes: Conjunctivae are normal. Pupils are equal, round, and reactive to light. No scleral icterus.  Neck: Neck supple.  Cardiovascular: Normal rate.   afib   Respiratory: Effort normal. No respiratory distress.  GI: Soft. She exhibits distension. She exhibits no mass. There is tenderness (near J tube site). There is no rebound and no guarding.  No cellulitis at J tube.  Small amt leakage at tube.  Musculoskeletal: Normal range of motion.  Neurological: She is alert and oriented to person, place, and time.  Skin: Skin is warm and dry. No rash noted. She is not diaphoretic. No erythema. No  pallor.  Psychiatric: She has a normal mood and affect. Her behavior is normal. Judgment and thought content normal.     Assessment/Plan Gastric cancer Atrial fibrillation Anticoagulation Anemia of chronic  disease Severe protein calorie malnutrition. Abd pain, nausea/vomiting/distention. Feeding by J tube.  CT ordered, pt drinking contrast Will admit for lack of good intake over the last few days.  Hope this will be brief, but given pain and distention, she may need to stay several days to iron out these issues.  Will keep NPO x ice chips, meds, and flushes via J tube at least overnight.   Continue coumadin. If proximal SB distended, may need NG tube.   Valerie Klein, MD 06/03/2016, 6:03 PM

## 2016-06-03 NOTE — ED Triage Notes (Signed)
Pt BIB [facility] from home for evaluation of abd pain/post op complications related to sx 2 weeks ago by [name]. Pt. Developed pain yesterday with some fluid/swelling around post op site. Pt. Reports consistent vomiting since this AM.

## 2016-06-03 NOTE — ED Notes (Signed)
Attempted report 

## 2016-06-03 NOTE — ED Notes (Signed)
Report received, pt not in room, pt in CT.

## 2016-06-03 NOTE — ED Notes (Signed)
Resting calm and still, alert, no dyspnea, NAD, interactive, c/o pain "worsened by CT scan", denies vomiting since arrival, family at [facility], updated. Pending CT results.

## 2016-06-03 NOTE — ED Provider Notes (Signed)
[facility] DEPT Provider Note   CSN: [id number] Arrival date & time: 06/03/16  1507     History   Chief Complaint Chief Complaint  Patient presents with  . Abdominal Pain    HPI [name] is a 82 y.o. female.  Patient is an 82 year old female with past medical history of coronary artery disease, pacemaker, hypertension, and atrial fibrillation. Several months ago she was diagnosed with gastric cancer. This was treated with surgery. She developed postoperative emesis and the prior surgery was revised with a Roux-en-Y procedure. This was done by [name]. The patient was originally doing well until 2 days ago when she developed increased discomfort of her abdomen around her G-tube site as well as swelling to her entire abdomen. She had one episode of vomiting this morning. Her bowels have been working normally, most recent bowel movement this morning. She denies any bloody vomit or stool. She denies any fevers or chills.      Past Medical History:  Diagnosis Date  . Adenocarcinoma of stomach ([place]) 11/11/2015   gastric mass on egd  . Anal fissure   . Anemia    hx 3/17 iron infusion, on aranesp and injected B12 since 11/2015.   [name] Anxiety   . Aortic root dilatation ([place])   . Arthritis   . 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 CAD (coronary artery disease)   . Cancer ([place])    hx Kidney cancer / hx of endometrial cancer  . CHF (congestive heart failure) ([place])   . Chronic kidney disease    only has one kidney rt lft rem 03 ca     dr. Risa Grill  . Elevated LFTs    hepatic steatosis  . Gastroparesis   . GERD (gastroesophageal reflux disease)   . History of uterine cancer 1980s   treated with hysterectomy, external radiation and radiation seed implants.   [name] HTN (hypertension)   . Hyperlipemia   . Hypothyroidism   . Moderate aortic stenosis   . Neuromuscular disorder (HCC)    neuropathy in right leg  . Neuropathy ([place])    feet/legs  . No blood products 02/27/2016  .  Osteomyelitis ([place]) as child  . PAF (paroxysmal atrial fibrillation) ([place])   . PONV (postoperative nausea and vomiting)   . Presence of permanent cardiac pacemaker   . Radiation proctitis   . Restless leg syndrome   . Stroke [facility]) 15   partial stroke left legally blind  . Tachycardia-bradycardia syndrome [facility])    s/p PPM by Dr Doreatha Lew (MDT) 07/03/10    Patient Active Problem List   Diagnosis Date Noted  . Esophagitis 05/18/2016  . Stomach cancer ([place]) 05/18/2016  . Long term (current) use of anticoagulants [Z79.01] 03/19/2016  . No blood products 02/27/2016  . AKI (acute kidney injury) ([place]) 02/17/2016  . S/P partial gastrectomy 02/17/2016  . Post gastrectomy syndrome   . Erosive esophagitis   . Gastroesophageal reflux disease with esophagitis   . Pressure ulcer 02/04/2016  . Gastric cancer ([place]) 01/15/2016  . Persistent atrial fibrillation ([place]) 01/15/2016  . Protein-calorie malnutrition, moderate ([place]) 12/08/2015  . B12 deficiency 11/24/2015  . Iron deficiency anemia due to chronic blood loss 11/24/2015  . Refusal of blood transfusions as patient is Jehovah's Witness   . Adenocarcinoma of stomach ([place]) 11/11/2015  . CKD (chronic kidney disease), stage III 11/10/2015  . Vitamin B12 deficiency 11/09/2015  . Peripheral nerve disease ([place]) 07/18/2015  . Rupture patellar tendon 07/29/2014  . Essential hypertension 06/04/2014  .  Medication management 02/15/2014  . Prediabetes 09/24/2013  . Vitamin D deficiency 09/24/2013  . Hyperlipemia   . Cardiac pacemaker (06/2010) 08/26/2012  . S/P right TKA 02/07/2012  . FHx/o Colon Cancer 07/27/2011  . Aortic stenosis, moderate 07/09/2011  . S/P repair of patent ductus arteriosus 04/22/2011  . Atrial fibrillation ([place]) Chadsvasc 5 12/02/2010  . Renal Cell Cancer ( 2003) 11/17/2007  . Hypothyroidism 11/17/2007    Past Surgical History:  Procedure Laterality Date  . CARDIAC CATHETERIZATION  2008  . CHOLECYSTECTOMY  1970s  .  COLONOSCOPY N/A 11/11/2015   Procedure: COLONOSCOPY;  Surgeon: [name], MD;  Location: [facility] ENDOSCOPY;  Service: Endoscopy;  Laterality: N/A;  . ESOPHAGOGASTRODUODENOSCOPY N/A 11/11/2015   Procedure: ESOPHAGOGASTRODUODENOSCOPY (EGD);  Surgeon: [name], MD;  Location: [facility];  Service: Endoscopy;  Laterality: N/A;  . ESOPHAGOGASTRODUODENOSCOPY N/A 02/05/2016   Procedure: ESOPHAGOGASTRODUODENOSCOPY (EGD);  Surgeon: Ladene Artist, MD;  Location: [facility] ENDOSCOPY;  Service: Endoscopy;  Laterality: N/A;  . ESOPHAGOGASTRODUODENOSCOPY N/A 04/02/2016   Procedure: ESOPHAGOGASTRODUODENOSCOPY (EGD);  Surgeon: [name], MD;  Location: [facility];  Service: Endoscopy;  Laterality: N/A;  . GASTRECTOMY N/A 01/15/2016   Procedure: OPEN PARTIAL GASTRECTOMY;  Surgeon: Stark Klein, MD;  Location: [place];  Service: General;  Laterality: N/A;  . GASTRECTOMY Left 02/24/2016   Procedure: OPEN J TUBE;  Surgeon: Stark Klein, MD;  Location: [place];  Service: General;  Laterality: Left;  [name] GASTROJEJUNOSTOMY N/A 05/18/2016   Procedure: ROUX EN Y GASTROJEJUNOSTOMY;  Surgeon: Stark Klein, MD;  Location: [place];  Service: General;  Laterality: N/A;  . I&D KNEE WITH POLY EXCHANGE Right 12/17/2013   Procedure: IRRIGATION AND DEBRIDEMEN RIGHT TOTAL  KNEE WITH POLY EXCHANGE;  Surgeon: Mauri Pole, MD;  Location[facility];  Service: Orthopedics;  Laterality: Right;  . LAPAROSCOPY N/A 01/15/2016   Procedure: LAPAROSCOPY DIAGNOSTIC;  Surgeon: Stark Klein, MD;  Location: [place];  Service: General;  Laterality: N/A;  . LYSIS OF ADHESION  05/18/2016   Procedure: LYSIS OF ADHESION;  Surgeon: Stark Klein, MD;  Location: [place];  Service: General;;  . NEPHRECTOMY Left    renal cell cancer  . PACEMAKER INSERTION  07/04/11   MDT PPM implanted by Dr Doreatha Lew for tachy/brady  . PATELLAR TENDON REPAIR Right 03/06/2014   Procedure: AVULSION WITH PATELLA TENDON REPAIR;  Surgeon: Mauri Pole, MD;  [facility];  Service:  Orthopedics;  Laterality: Right;  . PATENT DUCTUS ARTERIOUS REPAIR  ~ 1949  . TOTAL ABDOMINAL HYSTERECTOMY  85  . TOTAL HIP REVISION Right 2009   initial replacment surg  . TOTAL KNEE ARTHROPLASTY  02/07/2012   Procedure: TOTAL KNEE ARTHROPLASTY;  Surgeon: Mauri Pole, MD;  [facility];  Service: Orthopedics;  Laterality: Right;  . TOTAL KNEE REVISION Right 07/29/2014   Procedure: RIGHT KNEE REVISION OF PREVIOUS REPAIR EXTENSOR MECHANISM;  Surgeon: Mauri Pole, MD;  [facility];  Service: Orthopedics;  Laterality: Right;  . UPPER GASTROINTESTINAL ENDOSCOPY    . WHIPPLE PROCEDURE N/A 05/18/2016   Procedure: EXPLORATORY LAPAROTOMY;  Surgeon: Stark Klein, MD;  Location: [place];  Service: General;  Laterality: N/A;    OB History    No data available       Home Medications    Prior to Admission medications   Medication Sig Start Date End Date Taking? Authorizing Provider  acetaminophen (TYLENOL) 325 MG tablet Take 1 tablet (325 mg total) by mouth every 6 (six) hours as  needed for mild pain, moderate pain, fever or headache. 02/13/16   Mauricio Gerome Apley, MD  ALPRAZolam Duanne Moron) 0.5 MG tablet TAKE 1/2-1 TABLET BY MOUTH 3 TIMES A DAY AS NEEDED FOR ANXIETY 01/01/16   Courtney Forcucci, PA-C  Cholecalciferol (VITAMIN D3) LIQD 30 mLs by Gastric Tube route every 6 (six) hours. '500mg'$     Historical Provider, MD  Cyanocobalamin 1000 MCG/ML KIT Inject 1,000 mcg into the skin as directed. Patient taking differently: Inject 1,000 mcg into the skin every 30 (thirty) days.  11/24/15   Brunetta Genera, MD  diltiazem (CARDIZEM CD) 180 MG 24 hr capsule Take 1 capsule (180 mg total) by mouth daily. 05/05/16   [name], MD  furosemide (LASIX) 40 MG tablet TAKE 1 TABLET (40 MG TOTAL) BY MOUTH DAILY. Patient taking differently: TAKE 1/2 TABLET (20 MG TOTAL) BY MOUTH DAILY. 02/07/16   [name], MD  gabapentin (NEURONTIN) 100 MG capsule Take 1 capsule (100 mg total) by mouth 3 (three)  times daily. 05/31/16   Stark Klein, MD  levothyroxine (SYNTHROID, LEVOTHROID) 100 MCG tablet TAKE 1/2 TABLET (50 MCG TOTAL) BY MOUTH EVERY DAY ON EMPTY STOMACH WITH A FULL GLASS OF WATER 02/13/16   Mauricio Gerome Apley, MD  metoprolol (LOPRESSOR) 100 MG tablet Take 1 tablet (100 mg total) by mouth 2 (two) times daily. 05/05/16   [name], MD  nitroGLYCERIN (NITROSTAT) 0.4 MG SL tablet Place 1 tablet (0.4 mg total) under the tongue every 5 (five) minutes as needed for chest pain (MAX 3 TABLETS). 05/19/15   Darlin Coco, MD  Nutritional Supplements (FEEDING SUPPLEMENT, VITAL 1.5 CAL,) LIQD Place 1,100 mLs into feeding tube daily. 03/16/16   Verlee Monte, MD  ondansetron (ZOFRAN-ODT) 8 MG disintegrating tablet Take 1 tablet (8 mg total) by mouth every 8 (eight) hours as needed for nausea. 05/31/16   Stark Klein, MD  oxyCODONE (ROXICODONE) 5 MG/5ML solution Place 5 mg into feeding tube every 4 (four) hours as needed for severe pain.    Historical Provider, MD  pantoprazole (PROTONIX) 40 MG tablet TAKE 1 TABLET (40 MG TOTAL) BY MOUTH 2 (TWO) TIMES DAILY. 12/11/15   [name], MD  potassium chloride 20 MEQ/15ML (10%) SOLN Take 15 mLs (20 mEq total) by mouth daily. j tube 05/31/16   Stark Klein, MD  ranitidine (ZANTAC) 150 MG tablet Take 150 mg by mouth 2 (two) times daily.    Historical Provider, MD  traMADol (ULTRAM) 50 MG tablet Take 1 tablet (50 mg total) by mouth every 6 (six) hours as needed (pain). Reported on 10/14/2015 05/31/16   Stark Klein, MD  warfarin (COUMADIN) 2 MG tablet TAKE 1 TABLET DAILY OR AS DIRECTED Patient taking differently: Take 2 mg by mouth nightly (Sun/Mon/Tues/Wed/Thurs/Fri/Sat) 01/26/16   [name], MD    Family History Family History  Problem Relation Age of Onset  . Colon cancer Mother 34  . Heart disease Mother   . Heart disease Father   . Heart attack Father   . Heart disease Maternal Grandmother     Social History Social History  Substance Use  Topics  . Smoking status: Never Smoker  . Smokeless tobacco: Never Used  . Alcohol use No     Allergies   Amiodarone; Codeine; Hydrocodone; Morphine and related; Requip [ropinirole hcl]; Zinc; and Reglan [metoclopramide]   Review of Systems Review of Systems  All other systems reviewed and are negative.    Physical Exam Updated Vital Signs BP 144/82 (BP Location: Right Arm)  Pulse 74   Temp 98.7 F (37.1 C) (Oral)   Resp 18   LMP  (Exact Date)   SpO2 97%   Physical Exam  Constitutional: She is oriented to person, place, and time. She appears well-developed and well-nourished. No distress.  HENT:  Head: Normocephalic and atraumatic.  Neck: Normal range of motion. Neck supple.  Cardiovascular: Normal rate and regular rhythm.  Exam reveals no gallop and no friction rub.   No murmur heard. Pulmonary/Chest: Effort normal and breath sounds normal. No respiratory distress. She has no wheezes.  Abdominal: Soft. Bowel sounds are normal. She exhibits no distension. There is tenderness. There is no guarding. No hernia.  There is a midline healing incision with Steri-Strips in place. There is no significant redness or erythema or drainage. There is also a J-tube in place in the left mid abdomen. There is tenderness to the abdomen on the left side adjacent to the J-tube. There is mild to moderate abdominal distention, however no rebound or guarding.  Musculoskeletal: Normal range of motion.  Neurological: She is alert and oriented to person, place, and time.  Skin: Skin is warm and dry. She is not diaphoretic.  Nursing note and vitals reviewed.    ED Treatments / Results  Labs (all labs ordered are listed, but only abnormal results are displayed) Labs Reviewed  COMPREHENSIVE METABOLIC PANEL  LIPASE, BLOOD  CBC WITH DIFFERENTIAL/PLATELET    EKG  EKG Interpretation None       Radiology No results found.  Procedures Procedures (including critical care  time)  Medications Ordered in ED Medications - No data to display   Initial Impression / Assessment and Plan / ED Course  I have reviewed the triage vital signs and the nursing notes.  Pertinent labs & imaging results that were available during my care of the patient were reviewed by me and considered in my medical decision making (see chart for details).  Clinical Course    Laboratory studies are reassuring, however as the patient is quite tender to palpation and she is in the postoperative course, I elected to obtain a CT scan. I've spoken with general surgery and the patient will be evaluated by [name] here in the emergency department. She will determine the final disposition.  Final Clinical Impressions(s) / ED Diagnoses   Final diagnoses:  None    New Prescriptions New Prescriptions   No medications on file     Veryl Speak, MD 06/03/16 1636

## 2016-06-04 ENCOUNTER — Encounter (HOSPITAL_COMMUNITY): Payer: Self-pay | Admitting: General Practice

## 2016-06-04 LAB — BASIC METABOLIC PANEL
ANION GAP: 6 (ref 5–15)
BUN: 7 mg/dL (ref 6–20)
CALCIUM: 8.2 mg/dL — AB (ref 8.9–10.3)
CO2: 25 mmol/L (ref 22–32)
Chloride: 100 mmol/L — ABNORMAL LOW (ref 101–111)
Creatinine, Ser: 0.63 mg/dL (ref 0.44–1.00)
GLUCOSE: 103 mg/dL — AB (ref 65–99)
POTASSIUM: 4.1 mmol/L (ref 3.5–5.1)
SODIUM: 131 mmol/L — AB (ref 135–145)

## 2016-06-04 LAB — CBC
HCT: 29.1 % — ABNORMAL LOW (ref 36.0–46.0)
Hemoglobin: 9.2 g/dL — ABNORMAL LOW (ref 12.0–15.0)
MCH: 30.5 pg (ref 26.0–34.0)
MCHC: 31.6 g/dL (ref 30.0–36.0)
MCV: 96.4 fL (ref 78.0–100.0)
PLATELETS: 380 10*3/uL (ref 150–400)
RBC: 3.02 MIL/uL — AB (ref 3.87–5.11)
RDW: 15.5 % (ref 11.5–15.5)
WBC: 8 10*3/uL (ref 4.0–10.5)

## 2016-06-04 LAB — PHOSPHORUS: Phosphorus: 3.6 mg/dL (ref 2.5–4.6)

## 2016-06-04 LAB — PROTIME-INR
INR: 2.03
PROTHROMBIN TIME: 23.3 s — AB (ref 11.4–15.2)

## 2016-06-04 LAB — MAGNESIUM: MAGNESIUM: 1.8 mg/dL (ref 1.7–2.4)

## 2016-06-04 MED ORDER — WARFARIN SODIUM 2.5 MG PO TABS: 2.5000 mg | ORAL_TABLET | Freq: Every day | ORAL | Status: DC

## 2016-06-04 MED ORDER — PIPERACILLIN-TAZOBACTAM 3.375 G IVPB
3.3750 g | Freq: Three times a day (TID) | INTRAVENOUS | Status: DC
  Administered 2016-06-04 – 2016-06-12 (×24): 3.375 g via INTRAVENOUS
  Filled 2016-06-04 (×27): qty 50

## 2016-06-04 MED ORDER — ONDANSETRON HCL 4 MG/2ML IJ SOLN
8.0000 mg | Freq: Three times a day (TID) | INTRAMUSCULAR | Status: DC | PRN
  Administered 2016-06-04 – 2016-06-10 (×6): 8 mg via INTRAVENOUS
  Filled 2016-06-04 (×7): qty 4

## 2016-06-04 MED ORDER — VITAMIN K1 10 MG/ML IJ SOLN
5.0000 mg | Freq: Once | INTRAVENOUS | Status: AC
  Administered 2016-06-04: 5 mg via INTRAVENOUS
  Filled 2016-06-04: qty 0.5

## 2016-06-04 MED ORDER — ZINC OXIDE 40 % EX OINT
TOPICAL_OINTMENT | Freq: Three times a day (TID) | CUTANEOUS | Status: DC | PRN
  Filled 2016-06-04: qty 114

## 2016-06-04 MED ORDER — INFLUENZA VAC SPLIT QUAD 0.5 ML IM SUSY
0.5000 mL | PREFILLED_SYRINGE | INTRAMUSCULAR | Status: AC
Start: 2016-06-05 — End: 2016-06-09
  Administered 2016-06-09: 0.5 mL via INTRAMUSCULAR
  Filled 2016-06-04: qty 0.5

## 2016-06-04 NOTE — Progress Notes (Signed)
Verbal order received from [facility] for zofran  8mg  IV every 8 hours PRN for nausea and vomitting, will continue to monitor

## 2016-06-04 NOTE — Care Management Note (Signed)
Case Management Note Marvetta Gibbons RN, BSN Unit 2W-Case Manager [phone]  Patient Details  Name: MARKAYLA DIVIN MRN: [id number] Date of Birth: 07/22/1934  Subjective/Objective:   Pt recent exp lap went home on 9/25 - readmitted with N/V- abd distention              Action/Plan: PTA pt lived at home- was active with [facility] for HHRN/PT/OT/SLP and home TFs, will need resumption orders for [facility] at time of discharge. CM to follow  Expected Discharge Date:                  Expected Discharge Plan:  [place]  In-House Referral:     Discharge planning Services  CM Consult  Post Acute Care Choice:  [place], Resumption of Svcs/PTA Provider Choice offered to:  Patient  DME Arranged:    DME Agency:     HH Arranged:  RN, PT, OT, Speech Therapy [place] Agency:  [place]  Status of Service:  In process, will continue to follow  If discussed at Long Length of Stay Meetings, dates discussed:    Additional Comments:  Dawayne Patricia, RN 06/04/2016, 12:34 PM

## 2016-06-04 NOTE — Progress Notes (Signed)
Pharmacy Antibiotic Note  [name] is a 82 y.o. female admitted on 06/03/2016 with abdominal infection.  Pharmacy has been consulted for Zosyn dosing.  Plan: Zosyn 3.375g IV q8h (4 hour infusion).  Pharmacy to sign off and follow peripherally for renal changes  Height: 5\' 1"  (154.9 cm) Weight: 148 lb 9.4 oz (67.4 kg) IBW/kg (Calculated) : 47.8  Temp (24hrs), Avg:98.5 F (36.9 C), Min:97.8 F (36.6 C), Max:98.9 F (37.2 C)   Recent Labs Lab 05/29/16 0148 06/03/16 1539 06/04/16 0240  WBC 11.6* 10.0 8.0  CREATININE 0.65 0.63 0.63    Estimated Creatinine Clearance: 49.2 mL/min (by C-G formula based on SCr of 0.63 mg/dL).    Allergies  Allergen Reactions  . Adhesive [Tape] Other (See Comments)    Tears skin, Please use "paper" tape  . Amiodarone Other (See Comments)     Thyroid and liver and kidney problems  . Codeine Nausea And Vomiting  . Hydrocodone Other (See Comments)    hallucination  . Morphine And Related Nausea And Vomiting  . Requip [Ropinirole Hcl] Other (See Comments)    Headache   . Zinc Nausea Only    nausea  . Reglan [Metoclopramide] Hives, Itching and Rash    Made face red, also    Thank you for allowing pharmacy to be a part of this patient's care.  Tad Moore 06/04/2016 2:13 PM

## 2016-06-04 NOTE — Consult Note (Signed)
Chief Complaint: Patient was seen in consultation today for abdominal/pelvic abscess Chief Complaint  Patient presents with  . Abdominal Pain   at the request of Dr Stark Klein  Referring Physician(s): Dr Stark Klein  Supervising Physician: Jacqulynn Cadet  Patient Status: Inpatient  History of Present Illness: [name] is a 82 y.o. female   Pt with gastric cancer Proximal gastrectomy 01/2016 J tube placed 02/2016 but with intractable vomiting GJ placed 05/18/2016 Did well for few days Now with new abd pain; N/V and abdominal distension CT  9/28: IMPRESSION: Interval development of large enhancing fluid collection within the anterior abdomen, with intermixed small foci of gas. This likely represent an abscess. Second smaller rim enhancing fluid collection within the pelvis, also consistent with development of an abscess. Masslike appearance of the head of the pancreas. Given the abrupt change of the appearance of the pancreas from the prior CT dated 03/01/2016, infectious/inflammatory changes are favored such as acute pancreatitis. Please correlate clinically. Stable postsurgical changes and stable appearance of the percutaneous jejunostomy catheter.  Request for abdominal;pelvic drain placement per Dr Barry Dienes Dr Laurence Ferrari has reviewed imaging and approves procedures  INR 2.03 today Pt will not use blood products secondary Jehovah Witness Dr Barry Dienes will order Vit K today HOLD coumadin Recheck INR in am---plan for procedure 9/30   Past Medical History:  Diagnosis Date  . Adenocarcinoma of stomach ([name]) 11/11/2015   gastric mass on egd  . Anal fissure   . Anemia    hx 3/17 iron infusion, on aranesp and injected B12 since 11/2015.   [name] Anxiety   . Aortic root dilatation ([place])   . Arthritis   . 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 CAD (coronary artery disease)   . Cancer ([place])    hx Kidney cancer / hx of endometrial cancer  . CHF (congestive heart failure)  ([place])   . Chronic kidney disease    only has one kidney rt lft rem 03 ca     dr. Risa Grill  . Elevated LFTs    hepatic steatosis  . Gastroparesis   . GERD (gastroesophageal reflux disease)   . History of uterine cancer 1980s   treated with hysterectomy, external radiation and radiation seed implants.   [name] HTN (hypertension)   . Hyperlipemia   . Hypothyroidism   . Moderate aortic stenosis   . Neuromuscular disorder (H[facility])    neuropathy in right leg  . Neuropathy ([place])    feet/legs  . No blood products 02/27/2016  . Osteomyelitis ([place]) as child  . PAF (paroxysmal atrial fibrillation) ([place])   . PONV (postoperative nausea and vomiting)   . Post-operative nausea and vomiting 06/03/2016  . Presence of permanent cardiac pacemaker   . Radiation proctitis   . Restless leg syndrome   . Stroke [facility]) 15   partial stroke left legally blind  . Tachycardia-bradycardia syndrome ([place])    s/p PPM by Dr Doreatha Lew (MDT) 07/03/10    Past Surgical History:  Procedure Laterality Date  . CARDIAC CATHETERIZATION  2008  . CHOLECYSTECTOMY  1970s  . COLONOSCOPY N/A 11/11/2015   Procedure: COLONOSCOPY;  Surgeon: [name], MD;  [facility];  Service: Endoscopy;  Laterality: N/A;  . ESOPHAGOGASTRODUODENOSCOPY N/A 11/11/2015   Procedure: ESOPHAGOGASTRODUODENOSCOPY (EGD);  Surgeon: [name], MD;  [facility];  Service: Endoscopy;  Laterality: N/A;  . ESOPHAGOGASTRODUODENOSCOPY N/A 02/05/2016   Procedure: ESOPHAGOGASTRODUODENOSCOPY (EGD);  Surgeon: Ladene Artist, MD;  Location: [place];  Service:  Endoscopy;  Laterality: N/A;  . ESOPHAGOGASTRODUODENOSCOPY N/A 04/02/2016   Procedure: ESOPHAGOGASTRODUODENOSCOPY (EGD);  Surgeon: [name], MD;  Location: [facility];  Service: Endoscopy;  Laterality: N/A;  . GASTRECTOMY N/A 01/15/2016   Procedure: OPEN PARTIAL GASTRECTOMY;  Surgeon: Stark Klein, MD;  Location: [place];  Service: General;  Laterality: N/A;  . GASTRECTOMY Left  02/24/2016   Procedure: OPEN J TUBE;  Surgeon: Stark Klein, MD;  Location: [place];  Service: General;  Laterality: Left;  [name] GASTROJEJUNOSTOMY N/A 05/18/2016   Procedure: ROUX EN Y GASTROJEJUNOSTOMY;  Surgeon: Stark Klein, MD;  Location: [place];  Service: General;  Laterality: N/A;  . I&D KNEE WITH POLY EXCHANGE Right 12/17/2013   Procedure: IRRIGATION AND DEBRIDEMEN RIGHT TOTAL  KNEE WITH POLY EXCHANGE;  Surgeon: Mauri Pole, MD;  [facility];  Service: Orthopedics;  Laterality: Right;  . LAPAROSCOPY N/A 01/15/2016   Procedure: LAPAROSCOPY DIAGNOSTIC;  Surgeon: Stark Klein, MD;  Location: [place];  Service: General;  Laterality: N/A;  . LYSIS OF ADHESION  05/18/2016   Procedure: LYSIS OF ADHESION;  Surgeon: Stark Klein, MD;  Location: [place];  Service: General;;  . NEPHRECTOMY Left    renal cell cancer  . PACEMAKER INSERTION  07/04/11   MDT PPM implanted by Dr Doreatha Lew for tachy/brady  . PATELLAR TENDON REPAIR Right 03/06/2014   Procedure: AVULSION WITH PATELLA TENDON REPAIR;  Surgeon: Mauri Pole, MD;  [facility];  Service: Orthopedics;  Laterality: Right;  . PATENT DUCTUS ARTERIOUS REPAIR  ~ 1949  . TOTAL ABDOMINAL HYSTERECTOMY  85  . TOTAL HIP REVISION Right 2009   initial replacment surg  . TOTAL KNEE ARTHROPLASTY  02/07/2012   Procedure: TOTAL KNEE ARTHROPLASTY;  Surgeon: Mauri Pole, MD;  [facility];  Service: Orthopedics;  Laterality: Right;  . TOTAL KNEE REVISION Right 07/29/2014   Procedure: RIGHT KNEE REVISION OF PREVIOUS REPAIR EXTENSOR MECHANISM;  Surgeon: Mauri Pole, MD;  [facility];  Service: Orthopedics;  Laterality: Right;  . UPPER GASTROINTESTINAL ENDOSCOPY    . WHIPPLE PROCEDURE N/A 05/18/2016   Procedure: EXPLORATORY LAPAROTOMY;  Surgeon: Stark Klein, MD;  Location: [place];  Service: General;  Laterality: N/A;    Allergies: Adhesive [tape]; Amiodarone; Codeine; Hydrocodone; Morphine and related; Requip [ropinirole hcl]; Zinc; and Reglan  [metoclopramide]  Medications: Prior to Admission medications   Medication Sig Start Date End Date Taking? Authorizing Provider  Acetaminophen (TYLENOL) 167 MG/5ML LIQD Take 10 mLs by mouth daily as needed (for pain).   Yes Historical Provider, MD  ALPRAZolam (XANAX) 0.5 MG tablet TAKE 1/2-1 TABLET BY MOUTH 3 TIMES A DAY AS NEEDED FOR ANXIETY Patient taking differently: TAKE 1/2-1 TABLET (0.25 - 0.5 mg) BY MOUTH 3 TIMES A DAY AS NEEDED FOR ANXIETY 01/01/16  Yes Courtney Forcucci, PA-C  Cyanocobalamin 1000 MCG/ML KIT Inject 1,000 mcg into the skin as directed. Patient taking differently: Inject 1,000 mcg into the skin every 30 (thirty) days.  11/24/15  Yes Brunetta Genera, MD  diltiazem (CARDIZEM CD) 180 MG 24 hr capsule Take 1 capsule (180 mg total) by mouth daily. 05/05/16  Yes [name], MD  furosemide (LASIX) 40 MG tablet TAKE 1 TABLET (40 MG TOTAL) BY MOUTH DAILY. Patient taking differently: TAKE 1/2 TABLET (20 MG TOTAL) BY MOUTH DAILY. 02/07/16  Yes [name], MD  gabapentin (NEURONTIN) 250 MG/5ML solution Place 100 mg into feeding tube 3 (three) times daily.  04/12/16  Yes Historical Provider, MD  levothyroxine (SYNTHROID,  LEVOTHROID) 100 MCG tablet TAKE 1/2 TABLET (50 MCG TOTAL) BY MOUTH EVERY DAY ON EMPTY STOMACH WITH A FULL GLASS OF WATER 02/13/16  Yes Mauricio Gerome Apley, MD  metoprolol (LOPRESSOR) 100 MG tablet Take 1 tablet (100 mg total) by mouth 2 (two) times daily. 05/05/16  Yes [name], MD  nitroGLYCERIN (NITROSTAT) 0.4 MG SL tablet Place 1 tablet (0.4 mg total) under the tongue every 5 (five) minutes as needed for chest pain (MAX 3 TABLETS). 05/19/15  Yes Darlin Coco, MD  Nutritional Supplements (FEEDING SUPPLEMENT, VITAL 1.5 CAL,) LIQD Place 1,100 mLs into feeding tube daily. Patient taking differently: Place 35 mLs into feeding tube every hour. 35 ml per hour for 23 hours, 3.5 cans per day 03/16/16  Yes Verlee Monte, MD  ondansetron (ZOFRAN-ODT) 8 MG  disintegrating tablet Take 1 tablet (8 mg total) by mouth every 8 (eight) hours as needed for nausea. 05/31/16  Yes Stark Klein, MD  oxyCODONE (ROXICODONE) 5 MG/5ML solution Place 5 mg into feeding tube every 4 (four) hours as needed for severe pain.   Yes Historical Provider, MD  pantoprazole (PROTONIX) 40 MG tablet TAKE 1 TABLET (40 MG TOTAL) BY MOUTH 2 (TWO) TIMES DAILY. 12/11/15  Yes [name], MD  potassium chloride 20 MEQ/15ML (10%) SOLN Take 15 mLs (20 mEq total) by mouth daily. j tube 05/31/16  Yes Stark Klein, MD  ranitidine (ZANTAC) 150 MG tablet Take 150 mg by mouth 2 (two) times daily.   Yes Historical Provider, MD  traMADol (ULTRAM) 50 MG tablet Take 1 tablet (50 mg total) by mouth every 6 (six) hours as needed (pain). Reported on 10/14/2015 05/31/16  Yes Stark Klein, MD  warfarin (COUMADIN) 2 MG tablet TAKE 1 TABLET DAILY OR AS DIRECTED Patient taking differently: Take 2 mg by mouth nightly (Sun/Mon/Tues/Wed/Thurs/Fri/Sat) every day 01/26/16  Yes [name], MD     Family History  Problem Relation Age of Onset  . Colon cancer Mother 63  . Heart disease Mother   . Heart disease Father   . Heart attack Father   . Heart disease Maternal Grandmother     Social History   Social History  . Marital status: Widowed    Spouse name: N/A  . Number of children: N/A  . Years of education: N/A   Social History Main Topics  . Smoking status: Never Smoker  . Smokeless tobacco: Never Used  . Alcohol use No  . Drug use: No  . Sexual activity: No   Other Topics Concern  . None   Social History Narrative   Lives in [place].  Continues to work.    Review of Systems: A 12 point ROS discussed and pertinent positives are indicated in the HPI above.  All other systems are negative.  Review of Systems  Constitutional: Positive for activity change, appetite change, chills and fatigue. Negative for fever.  Respiratory: Negative for shortness of breath.     Gastrointestinal: Positive for abdominal distention, abdominal pain, nausea and vomiting.  Neurological: Positive for weakness.  Psychiatric/Behavioral: Negative for behavioral problems and confusion.    Vital Signs: BP (!) 160/92 (BP Location: Left Arm)   Pulse 100   Temp 98.7 F (37.1 C) (Oral)   Resp 20   Ht '5\' 1"'$  (1.549 m)   Wt 148 lb 9.4 oz (67.4 kg)   LMP  (Exact Date)   SpO2 98%   BMI 28.08 kg/m   Physical Exam  Constitutional: She is oriented to person, place, and  time.  Cardiovascular: Normal rate and regular rhythm.   Pulmonary/Chest: Effort normal and breath sounds normal. She has no wheezes.  Abdominal: Soft. Bowel sounds are normal. She exhibits distension. There is tenderness.  Musculoskeletal: Normal range of motion.  Neurological: She is alert and oriented to person, place, and time.  Skin: Skin is warm and dry.  Psychiatric: She has a normal mood and affect. Her behavior is normal. Judgment and thought content normal.  Nursing note and vitals reviewed.   Mallampati Score:  MD Evaluation Airway: WNL Heart: WNL Abdomen: WNL Chest/ Lungs: WNL ASA  Classification: 3 Mallampati/Airway Score: One  Imaging: Ct Abdomen Pelvis W Contrast  Result Date: 06/03/2016 CLINICAL DATA:  Abdominal pain and swelling. History of uterine, renal and gastric cancer. EXAM: CT ABDOMEN AND PELVIS WITH CONTRAST TECHNIQUE: Multidetector CT imaging of the abdomen and pelvis was performed using the standard protocol following bolus administration of intravenous contrast. CONTRAST:  136m ISOVUE-300 IOPAMIDOL (ISOVUE-300) INJECTION 61% COMPARISON:  CT of the abdomen 03/01/2016 FINDINGS: Lower chest: Bibasilar hypoventilatory changes. Tiny left pleural effusion. Dual lead cardiac pacemaker. Hepatobiliary: No focal liver abnormality is seen. Status post cholecystectomy. Minimal intrahepatic biliary ductal dilation. Extrahepatic common bile duct is normal for postcholecystectomy state.  Pancreas: Masslike appearance of the head of the pancreas represents acute change from the prior CT dated 03/01/2016. Spleen: Normal in size without focal abnormality. Adrenals/Urinary Tract: Normal appearance of the right kidney. Post left nephrectomy. The adrenal glands are normal. 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 A percutaneous jejunostomy tube is intraluminal. No evidence of bowel obstruction. There has been interval development of rim enhancing complex fluid and gas collection within the anterior upper abdomen, which measures 16.7 by 7.2 by 2.7 cm. Surrounding inflammatory changes are noted. Vascular/Lymphatic: Aortic atherosclerosis. No enlarged abdominal or pelvic lymph nodes. Reproductive: Status post hysterectomy. No adnexal masses. Other: Enhancing fluid collection in the pelvis measures 3.2 by 4.0 by 5.3 cm. Musculoskeletal: No acute or significant osseous findings. Osteoarthritic changes of the lumbosacral spine. Post total right hip arthroplasty. IMPRESSION: Interval development of large enhancing fluid collection within the anterior abdomen, with intermixed small foci of gas. This likely represent an abscess. Second smaller rim enhancing fluid collection within the pelvis, also consistent with development of an abscess. Masslike appearance of the head of the pancreas. Given the abrupt change of the appearance of the pancreas from the prior CT dated 03/01/2016, infectious/inflammatory changes are favored such as acute pancreatitis. Please correlate clinically. Stable postsurgical changes and stable appearance of the percutaneous jejunostomy catheter. These results were called by telephone at the time of interpretation on 06/03/2016 at 8:03 pm to Dr. PAlvino Chapel, who verbally acknowledged these results. Electronically Signed   By: DFidela SalisburyM.D.   On: 06/03/2016 20:07   Dg Abd 2 Views  Result Date: 05/24/2016 CLINICAL DATA:   Follow-up abdominal distension. EXAM: ABDOMEN - 2 VIEW COMPARISON:  05/20/2016 FINDINGS: Limited evaluation lung bases due to motion artifact. Patient does have cardiac pacer leads. Surgical skin staples in the abdomen and pelvis. Again noted is a jejunal feeding tube in the lower abdomen. Air-fluid levels in the abdomen without significant gaseous distension. No significant gas in the rectum. No significant free air on the upright view. IMPRESSION: Scattered air-fluid levels without significant bowel gaseous distension. Overall, this is a nonobstructive pattern. Feeding tube as described. Electronically Signed   By: AMarkus DaftM.D.   On: 05/24/2016 11:17   Dg UDuanne LimerickW/water  Sol Cm  Result Date: 05/20/2016 CLINICAL DATA:  Status post Roux-en-Y EXAM: WATER SOLUBLE UPPER GI SERIES TECHNIQUE: Single-column upper GI series was performed using water soluble contrast. CONTRAST:  Approximately 50 cc water-soluble contrast COMPARISON:  CT scan 03/01/2016. FLUOROSCOPY TIME:  Fluoroscopy Time:  2 minutes and 12 seconds. Radiation Exposure Index (if provided by the fluoroscopic device): Number of Acquired Spot Images: 13 FINDINGS: Pre-procedure KUB shows NG tube in place, compatible with cannulation of the efferent limb. J-tube is also visualized. Pneumatosis noted in the colon, as observed at the time of recent surgery 2 days ago. To confirm absence of intraperitoneal free air, decubitus film was obtained showing no evidence for intraperitoneal free gas. Patient was given water-soluble contrast by mouth. Swallowing made patient on comparable when she was very concerned about vomiting. In a slightly upright position, there is slow migration of water-soluble contrast down the esophagus into the stomach. There was stasis of contrast in the stomach and limited gastric peristalsis. Eventually, contrast did pass through the gastrojejunostomy along the course of the NG tube into a largely aperistaltic efferent limb. There is no  evidence for contrast leak at the level of the gastroenteric anastomosis. Given the lack of peristalsis and patient's concern about consuming additional contrast material, the distal Roux anastomosis could not be visualized. Although the distal anastomosis could not be demonstrated, there is no findings to suggest dilatation of the efferent limb. IMPRESSION: No evidence for contrast leak at the level of the gastroenteric anastomosis with very sluggish peristalsis in both the gastric remnant and efferent limb. No evidence for obstruction at the gastrojejunostomy. No evidence for dilatation of the efferent limb. Electronically Signed   By: Misty Stanley M.D.   On: 05/20/2016 14:14    Labs:  CBC:  Recent Labs  05/28/16 0430 05/29/16 0148 06/03/16 1539 06/04/16 0240  WBC 11.3* 11.6* 10.0 8.0  HGB 9.7* 9.8* 10.2* 9.2*  HCT 29.4* 30.7* 32.1* 29.1*  PLT 326 341 393 380    COAGS:  Recent Labs  11/07/15 1740  05/29/16 0148 05/30/16 0203 05/31/16 0436 06/04/16 0240  INR 2.31*  < > 1.99 1.62 1.55 2.03  APTT 44*  --   --   --   --   --   < > = values in this interval not displayed.  BMP:  Recent Labs  05/28/16 0430 05/29/16 0148 06/03/16 1539 06/04/16 0240  NA 135 139 135 131*  K 3.8 4.1 5.0 4.1  CL 103 104 102 100*  CO2 '24 27 26 25  '$ GLUCOSE 108* 111* 95 103*  BUN '6 6 12 7  '$ CALCIUM 8.1* 8.5* 8.4* 8.2*  CREATININE 0.61 0.65 0.63 0.63  GFRNONAA >60 >60 >60 >60  GFRAA >60 >60 >60 >60    LIVER FUNCTION TESTS:  Recent Labs  05/27/16 0220 05/28/16 0430 05/29/16 0148 06/03/16 1539  BILITOT 0.4 0.5 0.4 0.9  AST '18 17 15 '$ 34  ALT 17 16 13* 19  ALKPHOS 83 78 73 90  PROT 5.4* 5.4* 5.3* 5.7*  ALBUMIN 2.1* 2.2* 2.1* 2.3*    TUMOR MARKERS: No results for input(s): AFPTM, CEA, CA199, CHROMGRNA in the last 8760 hours.  Assessment and Plan:  Abdominal and pelvic abscesses For drain placements in am in Radiology (prob 9/30) INR 2.03 today----Vit K today per Dr Barry Dienes--  will recheck INR in am Risks and Benefits discussed with the patient and daughter including bleeding, infection, damage to adjacent structures, bowel perforation/fistula connection, and sepsis. All of the patient's  and daughter questions were answered, patient is agreeable to proceed. Consent signed and in chart.  Thank you for this interesting consult.  I greatly enjoyed meeting [name] and look forward to participating in their care.  A copy of this report was sent to the requesting provider on this date.  Electronically Signed: Monia Sabal A 06/04/2016, 9:27 AM   I spent a total of 40 Minutes    in face to face in clinical consultation, greater than 50% of which was counseling/coordinating care for abscess drains

## 2016-06-04 NOTE — Progress Notes (Signed)
Subjective: Still having nausea.  No fevers.    Objective: Vital signs in last 24 hours: Temp:  [97.8 F (36.6 C)-98.9 F (37.2 C)] 97.8 F (36.6 C) (09/29 1353) Pulse Rate:  [73-108] 73 (09/29 1353) Resp:  [14-26] 20 (09/29 1353) BP: (127-172)/(61-92) 138/69 (09/29 1353) SpO2:  [94 %-98 %] 96 % (09/29 1353) Weight:  [66.5 kg (146 lb 9.7 oz)-67.4 kg (148 lb 9.4 oz)] 67.4 kg (148 lb 9.4 oz) (09/29 0414) Last BM Date: 06/03/16  Intake/Output from previous day: No intake/output data recorded. Intake/Output this shift: No intake/output data recorded.  General appearance: alert, cooperative and mild distress Resp: breathing comfortably GI: soft, distended.  tender near J tube, but minimal erythema.    Lab Results:   Recent Labs  06/03/16 1539 06/04/16 0240  WBC 10.0 8.0  HGB 10.2* 9.2*  HCT 32.1* 29.1*  PLT 393 380   BMET  Recent Labs  06/03/16 1539 06/04/16 0240  NA 135 131*  K 5.0 4.1  CL 102 100*  CO2 26 25  GLUCOSE 95 103*  BUN 12 7  CREATININE 0.63 0.63  CALCIUM 8.4* 8.2*   PT/INR  Recent Labs  06/04/16 0240  LABPROT 23.3*  INR 2.03   ABG No results for input(s): PHART, HCO3 in the last 72 hours.  Invalid input(s): PCO2, PO2  Studies/Results: Ct Abdomen Pelvis W Contrast  Result Date: 06/03/2016 CLINICAL DATA:  Abdominal pain and swelling. History of uterine, renal and gastric cancer. EXAM: CT ABDOMEN AND PELVIS WITH CONTRAST TECHNIQUE: Multidetector CT imaging of the abdomen and pelvis was performed using the standard protocol following bolus administration of intravenous contrast. CONTRAST:  189mL ISOVUE-300 IOPAMIDOL (ISOVUE-300) INJECTION 61% COMPARISON:  CT of the abdomen 03/01/2016 FINDINGS: Lower chest: Bibasilar hypoventilatory changes. Tiny left pleural effusion. Dual lead cardiac pacemaker. Hepatobiliary: No focal liver abnormality is seen. Status post cholecystectomy. Minimal intrahepatic biliary ductal dilation. Extrahepatic common  bile duct is normal for postcholecystectomy state. Pancreas: Masslike appearance of the head of the pancreas represents acute change from the prior CT dated 03/01/2016. Spleen: Normal in size without focal abnormality. Adrenals/Urinary Tract: Normal appearance of the right kidney. Post left nephrectomy. The adrenal glands are normal. 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 A percutaneous jejunostomy tube is intraluminal. No evidence of bowel obstruction. There has been interval development of rim enhancing complex fluid and gas collection within the anterior upper abdomen, which measures 16.7 by 7.2 by 2.7 cm. Surrounding inflammatory changes are noted. Vascular/Lymphatic: Aortic atherosclerosis. No enlarged abdominal or pelvic lymph nodes. Reproductive: Status post hysterectomy. No adnexal masses. Other: Enhancing fluid collection in the pelvis measures 3.2 by 4.0 by 5.3 cm. Musculoskeletal: No acute or significant osseous findings. Osteoarthritic changes of the lumbosacral spine. Post total right hip arthroplasty. IMPRESSION: Interval development of large enhancing fluid collection within the anterior abdomen, with intermixed small foci of gas. This likely represent an abscess. Second smaller rim enhancing fluid collection within the pelvis, also consistent with development of an abscess. Masslike appearance of the head of the pancreas. Given the abrupt change of the appearance of the pancreas from the prior CT dated 03/01/2016, infectious/inflammatory changes are favored such as acute pancreatitis. Please correlate clinically. Stable postsurgical changes and stable appearance of the percutaneous jejunostomy catheter. These results were called by telephone at the time of interpretation on 06/03/2016 at 8:03 pm to Dr. Alvino Chapel , who verbally acknowledged these results. Electronically Signed   By: Linwood Dibbles.D.  On: 06/03/2016 20:07     Anti-infectives: Anti-infectives    None      Assessment/Plan: s/p * No surgery found *  Gastric cancer J tube in place N/V Anticoagulation A fib Abdominal fluid collections Chronic anemia Jehovah's witness  keep npo today x meds.  Start zosyn Given vitamin K. IR on board to aspirate/place perc drain in fluid collections Barrier cream around J tube site   LOS: 1 day    [facility] 06/04/2016

## 2016-06-05 ENCOUNTER — Inpatient Hospital Stay (HOSPITAL_COMMUNITY): Payer: Medicare Other

## 2016-06-05 LAB — CBC
HEMATOCRIT: 31.1 % — AB (ref 36.0–46.0)
HEMOGLOBIN: 9.7 g/dL — AB (ref 12.0–15.0)
MCH: 29.7 pg (ref 26.0–34.0)
MCHC: 31.2 g/dL (ref 30.0–36.0)
MCV: 95.1 fL (ref 78.0–100.0)
Platelets: 443 10*3/uL — ABNORMAL HIGH (ref 150–400)
RBC: 3.27 MIL/uL — ABNORMAL LOW (ref 3.87–5.11)
RDW: 15.1 % (ref 11.5–15.5)
WBC: 7.3 10*3/uL (ref 4.0–10.5)

## 2016-06-05 LAB — BASIC METABOLIC PANEL
ANION GAP: 11 (ref 5–15)
BUN: 6 mg/dL (ref 6–20)
CHLORIDE: 100 mmol/L — AB (ref 101–111)
CO2: 21 mmol/L — ABNORMAL LOW (ref 22–32)
Calcium: 8.5 mg/dL — ABNORMAL LOW (ref 8.9–10.3)
Creatinine, Ser: 0.76 mg/dL (ref 0.44–1.00)
GFR calc Af Amer: >60 mL/min (ref >60)
GLUCOSE: 112 mg/dL — AB (ref 65–99)
POTASSIUM: 4.1 mmol/L (ref 3.5–5.1)
SODIUM: 132 mmol/L — AB (ref 135–145)

## 2016-06-05 LAB — PROTIME-INR
INR: 1.26
Prothrombin Time: 15.9 seconds — ABNORMAL HIGH (ref 11.4–15.2)

## 2016-06-05 MED ORDER — ONDANSETRON HCL 4 MG/2ML IJ SOLN
INTRAMUSCULAR | Status: AC
  Filled 2016-06-05: qty 2

## 2016-06-05 MED ORDER — FENTANYL CITRATE (PF) 100 MCG/2ML IJ SOLN
INTRAMUSCULAR | Status: AC | PRN
  Administered 2016-06-05 (×4): 25 ug via INTRAVENOUS

## 2016-06-05 MED ORDER — MIDAZOLAM HCL 2 MG/2ML IJ SOLN
INTRAMUSCULAR | Status: AC
  Filled 2016-06-05: qty 4

## 2016-06-05 MED ORDER — FENTANYL CITRATE (PF) 100 MCG/2ML IJ SOLN
INTRAMUSCULAR | Status: AC
  Filled 2016-06-05: qty 4

## 2016-06-05 MED ORDER — MIDAZOLAM HCL 2 MG/2ML IJ SOLN
INTRAMUSCULAR | Status: AC | PRN
  Administered 2016-06-05 (×2): 1 mg via INTRAVENOUS

## 2016-06-05 MED ORDER — LIDOCAINE HCL 1 % IJ SOLN
INTRAMUSCULAR | Status: AC
  Filled 2016-06-05: qty 20

## 2016-06-05 MED ORDER — FENTANYL CITRATE (PF) 100 MCG/2ML IJ SOLN
INTRAMUSCULAR | Status: AC | PRN
  Administered 2016-06-05 (×2): 50 ug via INTRAVENOUS

## 2016-06-05 MED ORDER — ONDANSETRON HCL 4 MG/2ML IJ SOLN
INTRAMUSCULAR | Status: AC | PRN
  Administered 2016-06-05: 4 mg via INTRAVENOUS

## 2016-06-05 MED ORDER — LIDOCAINE HCL (PF) 1 % IJ SOLN
INTRAMUSCULAR | Status: AC
  Filled 2016-06-05: qty 30

## 2016-06-05 MED ORDER — MIDAZOLAM HCL 2 MG/2ML IJ SOLN
INTRAMUSCULAR | Status: AC | PRN
  Administered 2016-06-05 (×4): 0.5 mg via INTRAVENOUS

## 2016-06-05 NOTE — Sedation Documentation (Signed)
Patient denies pain and is resting comfortably.  

## 2016-06-05 NOTE — Progress Notes (Signed)
Subjective: Nauseated pain around J tube site noted  Drainage at J tube site NOTED BY FAMILY Has 2 fluid collections on CT IR consult in orders  INR  1.26 this am  Contrast shows no obstruction otherwise   Objective: Vital signs in last 24 hours: Temp:  [97.8 F (36.6 C)-98.9 F (37.2 C)] 98.8 F (37.1 C) (09/30 0527) Pulse Rate:  [73-108] 73 (09/30 0527) Resp:  [18-20] 18 (09/30 0527) BP: (138-164)/(69-97) 164/97 (09/30 0527) SpO2:  [96 %-98 %] 98 % (09/30 0527) Last BM Date: 06/05/16  Intake/Output from previous day: 09/29 0701 - 09/30 0700 In: 2296.7 [I.V.:2046.7; IV Piggyback:50] Out: -  Intake/Output this shift: No intake/output data recorded.  Abdomen:  Wound clean  J tube site with a small amount of yellow drainage  Some irritation noted around the tube  Tender and full around J tube     Lab Results:   Recent Labs  06/04/16 0240 06/05/16 0514  WBC 8.0 7.3  HGB 9.2* 9.7*  HCT 29.1* 31.1*  PLT 380 443*   BMET  Recent Labs  06/04/16 0240 06/05/16 0514  NA 131* 132*  K 4.1 4.1  CL 100* 100*  CO2 25 21*  GLUCOSE 103* 112*  BUN 7 6  CREATININE 0.63 0.76  CALCIUM 8.2* 8.5*   PT/INR  Recent Labs  06/04/16 0240 06/05/16 0514  LABPROT 23.3* 15.9*  INR 2.03 1.26   ABG No results for input(s): PHART, HCO3 in the last 72 hours.  Invalid input(s): PCO2, PO2  Studies/Results: Ct Abdomen Pelvis W Contrast  Result Date: 06/03/2016 CLINICAL DATA:  Abdominal pain and swelling. History of uterine, renal and gastric cancer. EXAM: CT ABDOMEN AND PELVIS WITH CONTRAST TECHNIQUE: Multidetector CT imaging of the abdomen and pelvis was performed using the standard protocol following bolus administration of intravenous contrast. CONTRAST:  142mL ISOVUE-300 IOPAMIDOL (ISOVUE-300) INJECTION 61% COMPARISON:  CT of the abdomen 03/01/2016 FINDINGS: Lower chest: Bibasilar hypoventilatory changes. Tiny left pleural effusion. Dual lead cardiac pacemaker. Hepatobiliary:  No focal liver abnormality is seen. Status post cholecystectomy. Minimal intrahepatic biliary ductal dilation. Extrahepatic common bile duct is normal for postcholecystectomy state. Pancreas: Masslike appearance of the head of the pancreas represents acute change from the prior CT dated 03/01/2016. Spleen: Normal in size without focal abnormality. Adrenals/Urinary Tract: Normal appearance of the right kidney. Post left nephrectomy. The adrenal glands are normal. 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 A percutaneous jejunostomy tube is intraluminal. No evidence of bowel obstruction. There has been interval development of rim enhancing complex fluid and gas collection within the anterior upper abdomen, which measures 16.7 by 7.2 by 2.7 cm. Surrounding inflammatory changes are noted. Vascular/Lymphatic: Aortic atherosclerosis. No enlarged abdominal or pelvic lymph nodes. Reproductive: Status post hysterectomy. No adnexal masses. Other: Enhancing fluid collection in the pelvis measures 3.2 by 4.0 by 5.3 cm. Musculoskeletal: No acute or significant osseous findings. Osteoarthritic changes of the lumbosacral spine. Post total right hip arthroplasty. IMPRESSION: Interval development of large enhancing fluid collection within the anterior abdomen, with intermixed small foci of gas. This likely represent an abscess. Second smaller rim enhancing fluid collection within the pelvis, also consistent with development of an abscess. Masslike appearance of the head of the pancreas. Given the abrupt change of the appearance of the pancreas from the prior CT dated 03/01/2016, infectious/inflammatory changes are favored such as acute pancreatitis. Please correlate clinically. Stable postsurgical changes and stable appearance of the percutaneous jejunostomy catheter. These results were called  by telephone at the time of interpretation on 06/03/2016 at 8:03 pm to Dr. Alvino Chapel ,  who verbally acknowledged these results. Electronically Signed   By: Fidela Salisbury M.D.   On: 06/03/2016 20:07    Anti-infectives: Anti-infectives    Start     Dose/Rate Route Frequency Ordered Stop   06/04/16 1500  piperacillin-tazobactam (ZOSYN) IVPB 3.375 g     3.375 g 12.5 mL/hr over 240 Minutes Intravenous Every 8 hours 06/04/16 1413        Assessment/Plan: Patient Active Problem List   Diagnosis Date Noted  . Post-operative nausea and vomiting 06/03/2016  . Esophagitis 05/18/2016  . Stomach cancer ([place]) 05/18/2016  . Long term (current) use of anticoagulants [Z79.01] 03/19/2016  . No blood products 02/27/2016  . AKI (acute kidney injury) ([place]) 02/17/2016  . S/P partial gastrectomy 02/17/2016  . Post gastrectomy syndrome   . Erosive esophagitis   . Gastroesophageal reflux disease with esophagitis   . Pressure ulcer 02/04/2016  . Gastric cancer ([place]) 01/15/2016  . Persistent atrial fibrillation ([place]) 01/15/2016  . Protein-calorie malnutrition, moderate ([place]) 12/08/2015  . B12 deficiency 11/24/2015  . Iron deficiency anemia due to chronic blood loss 11/24/2015  . Refusal of blood transfusions as patient is Jehovah's Witness   . Adenocarcinoma of stomach ([place]) 11/11/2015  . CKD (chronic kidney disease), stage III 11/10/2015  . Vitamin B12 deficiency 11/09/2015  . Peripheral nerve disease ([place]) 07/18/2015  . Rupture patellar tendon 07/29/2014  . Essential hypertension 06/04/2014  . Medication management 02/15/2014  . Prediabetes 09/24/2013  . Vitamin D deficiency 09/24/2013  . Hyperlipemia   . Cardiac pacemaker (06/2010) 08/26/2012  . S/P right TKA 02/07/2012  . FHx/o Colon Cancer 07/27/2011  . Aortic stenosis, moderate 07/09/2011  . S/P repair of patent ductus arteriosus 04/22/2011  . Atrial fibrillation ([place]) Chadsvasc 5 12/02/2010  . Renal Cell Cancer ( 2003) 11/17/2007  . Hypothyroidism 11/17/2007   2 fluid collection on CT   IR consult in chart INR  normal Keep NPO for now and hold TF for the time being Would not start TNA at this point   Continue ABX  HOLD COUMADIN FOR NOW   LOS: 2 days    Fredia Chittenden A. 06/05/2016

## 2016-06-05 NOTE — Procedures (Signed)
Technically successful CT guided placement of a 10 Fr drainage catheter placement into the left upper abdomen yielding 170 cc of purulent material.  Technically successful CT guided placement of a 10 Fr drainage catheter placement into the lower pelvis via TG approach yielding 10 cc of bloody, non-foul smelling material.  Aspirated samples were sent separately to the laboratory for analysis.    EBL: None No immediate post procedural complications.   Ronny Bacon, MD Pager #: [phone]

## 2016-06-06 LAB — CBC
HCT: 30.8 % — ABNORMAL LOW (ref 36.0–46.0)
HEMOGLOBIN: 9.7 g/dL — AB (ref 12.0–15.0)
MCH: 29.8 pg (ref 26.0–34.0)
MCHC: 31.5 g/dL (ref 30.0–36.0)
MCV: 94.8 fL (ref 78.0–100.0)
Platelets: 428 10*3/uL — ABNORMAL HIGH (ref 150–400)
RBC: 3.25 MIL/uL — ABNORMAL LOW (ref 3.87–5.11)
RDW: 15.1 % (ref 11.5–15.5)
WBC: 7.1 10*3/uL (ref 4.0–10.5)

## 2016-06-06 LAB — BASIC METABOLIC PANEL
ANION GAP: 8 (ref 5–15)
BUN: 5 mg/dL — AB (ref 6–20)
CALCIUM: 8.4 mg/dL — AB (ref 8.9–10.3)
CO2: 25 mmol/L (ref 22–32)
Chloride: 103 mmol/L (ref 101–111)
Creatinine, Ser: 0.86 mg/dL (ref 0.44–1.00)
GFR calc Af Amer: >60 mL/min (ref >60)
GFR calc non Af Amer: >60 mL/min (ref >60)
GLUCOSE: 106 mg/dL — AB (ref 65–99)
Potassium: 3.6 mmol/L (ref 3.5–5.1)
Sodium: 136 mmol/L (ref 135–145)

## 2016-06-06 LAB — PROTIME-INR
INR: 1.28
PROTHROMBIN TIME: 16 s — AB (ref 11.4–15.2)

## 2016-06-06 NOTE — Progress Notes (Signed)
Subjective: Feels better  Nausea better  Drains in place per IR   Objective: Vital signs in last 24 hours: Temp:  [98.1 F (36.7 C)-98.4 F (36.9 C)] 98.1 F (36.7 C) (10/01 0445) Pulse Rate:  [63-87] 70 (10/01 0445) Resp:  [10-23] 18 (10/01 0445) BP: (106-189)/(52-81) 106/63 (10/01 0445) SpO2:  [92 %-99 %] 97 % (10/01 0445) Last BM Date: 06/05/16  Intake/Output from previous day: 09/30 0701 - 10/01 0700 In: 100  Out: 250 [Drains:250] Intake/Output this shift: No intake/output data recorded.  Incision/Wound:j SITE WITH MINIMAL DRAINAGE  PER DRAINS IN PLACE  SOFT  MINIMALLY TENDER   Lab Results:   Recent Labs  06/05/16 0514 06/06/16 0218  WBC 7.3 7.1  HGB 9.7* 9.7*  HCT 31.1* 30.8*  PLT 443* 428*   BMET  Recent Labs  06/05/16 0514 06/06/16 0218  NA 132* 136  K 4.1 3.6  CL 100* 103  CO2 21* 25  GLUCOSE 112* 106*  BUN 6 5*  CREATININE 0.76 0.86  CALCIUM 8.5* 8.4*   PT/INR  Recent Labs  06/05/16 0514 06/06/16 0218  LABPROT 15.9* 16.0*  INR 1.26 1.28   ABG No results for input(s): PHART, HCO3 in the last 72 hours.  Invalid input(s): PCO2, PO2  Studies/Results: Ct Image Guided Drainage By Percutaneous Catheter  Result Date: 06/05/2016 INDICATION: History of gastric cancer, post proximal gastrectomy performed 01/2016. Gastrojejunostomy catheter placed on 05/18/2016 however patient developed postprocedural abdominal pain and distention. CT scan of the abdomen pelvis performed 06/03/2016 demonstrated development of an indeterminate fluid collection with the anterior aspect of the upper abdomen as well as the pelvic cul-de-sac. As such, request made for CT-guided placement of percutaneous drainage catheter(s) and/or aspiration. EXAM: 1. CT-GUIDED ABDOMINAL PERCUTANEOUS DRAINAGE CATHETER PLACEMENT. 2. CT-GUIDED TRANS GLUTEAL APPROACH PELVIC PERCUTANEOUS DRAINAGE CATHETER PLACEMENT. COMPARISON:  CT abdomen pelvis - 06/05/2016; 06/03/2016 MEDICATIONS: The  patient is currently admitted to the hospital and receiving intravenous antibiotics. The antibiotics were administered within an appropriate time frame prior to the initiation of the procedure. ANESTHESIA/SEDATION: Moderate (conscious) sedation was employed during this procedure. A total of Versed 2 mg and Fentanyl 100 mcg was administered intravenously. Moderate Sedation Time: 16 minutes. The patient's level of consciousness and vital signs were monitored continuously by radiology nursing throughout the procedure under my direct supervision. CONTRAST:  None COMPLICATIONS: None immediate. PROCEDURE: Informed written consent was obtained from the patient after a discussion of the risks, benefits and alternatives to treatment. The patient was initially placed supine on the CT gantry and a pre procedural CT was performed re-demonstrating the known abscess/fluid collection within the upper abdomen with dominant component measuring approximately 5.0 x 8.8 cm (image 47, series 3). The procedure was planned. A timeout was performed prior to the initiation of the procedure. The skin overlying the left lateral mid abdomen was prepped and draped in the usual sterile fashion. The overlying soft tissues were anesthetized with 1% lidocaine with epinephrine. Appropriate trajectory was planned with the use of a 22 gauge spinal needle. An 18 gauge trocar needle was advanced into the abscess/fluid collection and a short Amplatz super stiff wire was coiled within the collection. Appropriate positioning was confirmed with a limited CT scan. The tract was serially dilated allowing placement of a 10 [place] all-purpose drainage catheter. Appropriate positioning was confirmed with a limited postprocedural CT scan. Approximately 170 Ml of purulent fluid was aspirated. The tube was connected to a JP bulb and sutured in place. A dressing was placed. Postprocedural  imaging demonstrated a near complete evacuation of the dominant fluid  collection within the upper abdomen however note was made of a residual approximately 3.7 x 4.6 cm fluid collection within the lower pelvis (image 45, series 3) compatible the findings seen on preceding abdominal CT performed 06/03/2016. Given the purulent nature of the aspirated fluid from the upper abdomen, the decision was made to attempt right trans gluteal approach drainage catheter placement into this additional collection within the lower pelvis. As such, the patient was positioned left lateral decubitus. The skin overlying the right buttocks was prepped and draped in usual sterile fashion. The overlying soft tissues were anesthetized with 1% lidocaine with epinephrine. Appropriate trajectory was planned with the use of a 22 gauge spinal needle. An 18 gauge trocar needle was advanced into the abscess/fluid collection and a short Amplatz super stiff wire was coiled within the collection. Appropriate positioning was confirmed with a limited CT scan. The tract was serially dilated allowing placement of a 10 [place] all-purpose drainage catheter. Appropriate positioning was confirmed with a limited postprocedural CT scan. Approximately 10 cc of blood tinged non foul smelling fluid was aspirated. The tube was connected to a JP bulb and sutured in place. A dressing was placed. The patient tolerated the above procedures well without immediate postprocedural complication. IMPRESSION: 1. Successful CT guided placement of a 10 French all purpose drain catheter into the left mid hemi abdomen with aspiration of approximately 170 cc of purulent fluid. 2. Successful CT-guided placement of a 10 French all-purpose drainage catheter into the lower pelvis via a right trans gluteal approach with aspiration of approximately 10 cc of blood tinged, non foul smelling fluid. 3. Samples were sent to the laboratory separately from both percutaneous drainage catheters as requested by the ordering clinical team. Electronically Signed    By: Sandi Mariscal M.D.   On: 06/05/2016 17:16   Ct Image Guided Drainage By Percutaneous Catheter  Result Date: 06/05/2016 INDICATION: History of gastric cancer, post proximal gastrectomy performed 01/2016. Gastrojejunostomy catheter placed on 05/18/2016 however patient developed postprocedural abdominal pain and distention. CT scan of the abdomen pelvis performed 06/03/2016 demonstrated development of an indeterminate fluid collection with the anterior aspect of the upper abdomen as well as the pelvic cul-de-sac. As such, request made for CT-guided placement of percutaneous drainage catheter(s) and/or aspiration. EXAM: 1. CT-GUIDED ABDOMINAL PERCUTANEOUS DRAINAGE CATHETER PLACEMENT. 2. CT-GUIDED TRANS GLUTEAL APPROACH PELVIC PERCUTANEOUS DRAINAGE CATHETER PLACEMENT. COMPARISON:  CT abdomen pelvis - 06/05/2016; 06/03/2016 MEDICATIONS: The patient is currently admitted to the hospital and receiving intravenous antibiotics. The antibiotics were administered within an appropriate time frame prior to the initiation of the procedure. ANESTHESIA/SEDATION: Moderate (conscious) sedation was employed during this procedure. A total of Versed 2 mg and Fentanyl 100 mcg was administered intravenously. Moderate Sedation Time: 16 minutes. The patient's level of consciousness and vital signs were monitored continuously by radiology nursing throughout the procedure under my direct supervision. CONTRAST:  None COMPLICATIONS: None immediate. PROCEDURE: Informed written consent was obtained from the patient after a discussion of the risks, benefits and alternatives to treatment. The patient was initially placed supine on the CT gantry and a pre procedural CT was performed re-demonstrating the known abscess/fluid collection within the upper abdomen with dominant component measuring approximately 5.0 x 8.8 cm (image 47, series 3). The procedure was planned. A timeout was performed prior to the initiation of the procedure. The skin  overlying the left lateral mid abdomen was prepped and draped in the usual sterile fashion.  The overlying soft tissues were anesthetized with 1% lidocaine with epinephrine. Appropriate trajectory was planned with the use of a 22 gauge spinal needle. An 18 gauge trocar needle was advanced into the abscess/fluid collection and a short Amplatz super stiff wire was coiled within the collection. Appropriate positioning was confirmed with a limited CT scan. The tract was serially dilated allowing placement of a 10 [place] all-purpose drainage catheter. Appropriate positioning was confirmed with a limited postprocedural CT scan. Approximately 170 Ml of purulent fluid was aspirated. The tube was connected to a JP bulb and sutured in place. A dressing was placed. Postprocedural imaging demonstrated a near complete evacuation of the dominant fluid collection within the upper abdomen however note was made of a residual approximately 3.7 x 4.6 cm fluid collection within the lower pelvis (image 45, series 3) compatible the findings seen on preceding abdominal CT performed 06/03/2016. Given the purulent nature of the aspirated fluid from the upper abdomen, the decision was made to attempt right trans gluteal approach drainage catheter placement into this additional collection within the lower pelvis. As such, the patient was positioned left lateral decubitus. The skin overlying the right buttocks was prepped and draped in usual sterile fashion. The overlying soft tissues were anesthetized with 1% lidocaine with epinephrine. Appropriate trajectory was planned with the use of a 22 gauge spinal needle. An 18 gauge trocar needle was advanced into the abscess/fluid collection and a short Amplatz super stiff wire was coiled within the collection. Appropriate positioning was confirmed with a limited CT scan. The tract was serially dilated allowing placement of a 10 [place] all-purpose drainage catheter. Appropriate positioning was  confirmed with a limited postprocedural CT scan. Approximately 10 cc of blood tinged non foul smelling fluid was aspirated. The tube was connected to a JP bulb and sutured in place. A dressing was placed. The patient tolerated the above procedures well without immediate postprocedural complication. IMPRESSION: 1. Successful CT guided placement of a 10 French all purpose drain catheter into the left mid hemi abdomen with aspiration of approximately 170 cc of purulent fluid. 2. Successful CT-guided placement of a 10 French all-purpose drainage catheter into the lower pelvis via a right trans gluteal approach with aspiration of approximately 10 cc of blood tinged, non foul smelling fluid. 3. Samples were sent to the laboratory separately from both percutaneous drainage catheters as requested by the ordering clinical team. Electronically Signed   By: Sandi Mariscal M.D.   On: 06/05/2016 17:16    Anti-infectives: Anti-infectives    Start     Dose/Rate Route Frequency Ordered Stop   06/04/16 1500  piperacillin-tazobactam (ZOSYN) IVPB 3.375 g     3.375 g 12.5 mL/hr over 240 Minutes Intravenous Every 8 hours 06/04/16 1413        Assessment/Plan: Patient Active Problem List   Diagnosis Date Noted  . Post-operative nausea and vomiting 06/03/2016  . Esophagitis 05/18/2016  . Stomach cancer ([place]) 05/18/2016  . Long term (current) use of anticoagulants [Z79.01] 03/19/2016  . No blood products 02/27/2016  . AKI (acute kidney injury) ([place]) 02/17/2016  . S/P partial gastrectomy 02/17/2016  . Post gastrectomy syndrome   . Erosive esophagitis   . Gastroesophageal reflux disease with esophagitis   . Pressure ulcer 02/04/2016  . Gastric cancer ([place]) 01/15/2016  . Persistent atrial fibrillation ([place]) 01/15/2016  . Protein-calorie malnutrition, moderate ([place]) 12/08/2015  . B12 deficiency 11/24/2015  . Iron deficiency anemia due to chronic blood loss 11/24/2015  . Refusal of blood transfusions  as patient is  Jehovah's Witness   . Adenocarcinoma of stomach ([place]) 11/11/2015  . CKD (chronic kidney disease), stage III 11/10/2015  . Vitamin B12 deficiency 11/09/2015  . Peripheral nerve disease ([place]) 07/18/2015  . Rupture patellar tendon 07/29/2014  . Essential hypertension 06/04/2014  . Medication management 02/15/2014  . Prediabetes 09/24/2013  . Vitamin D deficiency 09/24/2013  . Hyperlipemia   . Cardiac pacemaker (06/2010) 08/26/2012  . S/P right TKA 02/07/2012  . FHx/o Colon Cancer 07/27/2011  . Aortic stenosis, moderate 07/09/2011  . S/P repair of patent ductus arteriosus 04/22/2011  . Atrial fibrillation ([place]) Chadsvasc 5 12/02/2010  . Renal Cell Cancer ( 2003) 11/17/2007  . Hypothyroidism 11/17/2007  IMPROVED WITH PERCUTANEOUS DRAIN TRY CLEARS   LOS: 3 days    [name] A. 06/06/2016

## 2016-06-06 NOTE — Progress Notes (Signed)
Referring Physician(s): [name]  Supervising Physician: Sandi Mariscal  Patient Status:  Inpatient  Chief Complaint: Abdominal and pelvic abscesses  Subjective: S/p perc drain x 2 yesterday Feeling much better today  Allergies: Adhesive [tape]; Amiodarone; Codeine; Hydrocodone; Morphine and related; Requip [ropinirole hcl]; Zinc; and Reglan [metoclopramide]  Medications:  Current Facility-Administered Medications:  .  acetaminophen (TYLENOL) solution 320 mg, 320 mg, Oral, Daily PRN, Stark Klein, MD, 320 mg at 06/05/16 2142 .  ALPRAZolam (XANAX) tablet 0.25 mg, 0.25 mg, Oral, TID PRN, Stark Klein, MD .  dextrose 5 % and 0.45 % NaCl with KCl 20 mEq/L infusion, , Intravenous, Continuous, Stark Klein, MD, Last Rate: 100 mL/hr at 06/05/16 2258 .  diltiazem (CARDIZEM CD) 24 hr capsule 180 mg, 180 mg, Oral, Daily, Stark Klein, MD, 180 mg at 06/05/16 1131 .  famotidine (PEPCID) tablet 20 mg, 20 mg, Oral, BID, Stark Klein, MD, 20 mg at 06/05/16 2134 .  furosemide (LASIX) tablet 20 mg, 20 mg, Oral, Daily, Stark Klein, MD, 20 mg at 06/05/16 1131 .  gabapentin (NEURONTIN) 250 MG/5ML solution 100 mg, 100 mg, Per Tube, TID, Stark Klein, MD, 100 mg at 06/05/16 2134 .  Influenza vac split quadrivalent PF (FLUARIX) injection 0.5 mL, 0.5 mL, Intramuscular, Tomorrow-1000, Stark Klein, MD .  levothyroxine (SYNTHROID, LEVOTHROID) tablet 50 mcg, 50 mcg, Oral, QAC breakfast, Stark Klein, MD, 50 mcg at 06/06/16 0558 .  liver oil-zinc oxide (DESITIN) 40 % ointment, , Topical, TID PRN, Jerrye Beavers, PA-C .  metoprolol (LOPRESSOR) tablet 100 mg, 100 mg, Oral, BID, Stark Klein, MD, 100 mg at 06/05/16 2134 .  nitroGLYCERIN (NITROSTAT) SL tablet 0.4 mg, 0.4 mg, Sublingual, Q5 min PRN, Stark Klein, MD .  ondansetron [facility]) injection 8 mg, 8 mg, Intravenous, Q8H PRN, Stark Klein, MD, 8 mg at 06/05/16 2150 .  oxyCODONE (ROXICODONE) 5 MG/5ML solution 5 mg, 5 mg, Per Tube, Q4H PRN, Stark Klein,  MD, 5 mg at 06/04/16 1009 .  piperacillin-tazobactam (ZOSYN) IVPB 3.375 g, 3.375 g, Intravenous, Q8H, Stark Klein, MD, 3.375 g at 06/06/16 0606 .  potassium chloride 20 MEQ/15ML (10%) solution 20 mEq, 20 mEq, Oral, Daily, Stark Klein, MD, Stopped at 06/05/16 1132 .  traMADol (ULTRAM) tablet 50 mg, 50 mg, Oral, Q6H PRN, Stark Klein, MD, 50 mg at 06/06/16 0512    Vital Signs: BP 106/63   Pulse 70   Temp 98.1 F (36.7 C) (Oral)   Resp 18   Ht 5\' 1"  (1.549 m)   Wt 148 lb 9.4 oz (67.4 kg)   LMP  (Exact Date)   SpO2 97%   BMI 28.08 kg/m   Physical Exam Left abd drain intact, site clean. Purulent output (R)transgluteal drain intact, site clean. Output thin amber/brown/old blood  Imaging:  Ct Image Guided Drainage By Percutaneous Catheter  Result Date: 06/05/2016 INDICATION: History of gastric cancer, post proximal gastrectomy performed 01/2016. Gastrojejunostomy catheter placed on 05/18/2016 however patient developed postprocedural abdominal pain and distention. CT scan of the abdomen pelvis performed 06/03/2016 demonstrated development of an indeterminate fluid collection with the anterior aspect of the upper abdomen as well as the pelvic cul-de-sac. As such, request made for CT-guided placement of percutaneous drainage catheter(s) and/or aspiration. EXAM: 1. CT-GUIDED ABDOMINAL PERCUTANEOUS DRAINAGE CATHETER PLACEMENT. 2. CT-GUIDED TRANS GLUTEAL APPROACH PELVIC PERCUTANEOUS DRAINAGE CATHETER PLACEMENT. COMPARISON:  CT abdomen pelvis - 06/05/2016; 06/03/2016 MEDICATIONS: The patient is currently admitted to the hospital and receiving intravenous antibiotics. The antibiotics were administered within an appropriate time  frame prior to the initiation of the procedure. ANESTHESIA/SEDATION: Moderate (conscious) sedation was employed during this procedure. A total of Versed 2 mg and Fentanyl 100 mcg was administered intravenously. Moderate Sedation Time: 16 minutes. The patient's level of  consciousness and vital signs were monitored continuously by radiology nursing throughout the procedure under my direct supervision. CONTRAST:  None COMPLICATIONS: None immediate. PROCEDURE: Informed written consent was obtained from the patient after a discussion of the risks, benefits and alternatives to treatment. The patient was initially placed supine on the CT gantry and a pre procedural CT was performed re-demonstrating the known abscess/fluid collection within the upper abdomen with dominant component measuring approximately 5.0 x 8.8 cm (image 47, series 3). The procedure was planned. A timeout was performed prior to the initiation of the procedure. The skin overlying the left lateral mid abdomen was prepped and draped in the usual sterile fashion. The overlying soft tissues were anesthetized with 1% lidocaine with epinephrine. Appropriate trajectory was planned with the use of a 22 gauge spinal needle. An 18 gauge trocar needle was advanced into the abscess/fluid collection and a short Amplatz super stiff wire was coiled within the collection. Appropriate positioning was confirmed with a limited CT scan. The tract was serially dilated allowing placement of a 10 [place] all-purpose drainage catheter. Appropriate positioning was confirmed with a limited postprocedural CT scan. Approximately 170 Ml of purulent fluid was aspirated. The tube was connected to a JP bulb and sutured in place. A dressing was placed. Postprocedural imaging demonstrated a near complete evacuation of the dominant fluid collection within the upper abdomen however note was made of a residual approximately 3.7 x 4.6 cm fluid collection within the lower pelvis (image 45, series 3) compatible the findings seen on preceding abdominal CT performed 06/03/2016. Given the purulent nature of the aspirated fluid from the upper abdomen, the decision was made to attempt right trans gluteal approach drainage catheter placement into this additional  collection within the lower pelvis. As such, the patient was positioned left lateral decubitus. The skin overlying the right buttocks was prepped and draped in usual sterile fashion. The overlying soft tissues were anesthetized with 1% lidocaine with epinephrine. Appropriate trajectory was planned with the use of a 22 gauge spinal needle. An 18 gauge trocar needle was advanced into the abscess/fluid collection and a short Amplatz super stiff wire was coiled within the collection. Appropriate positioning was confirmed with a limited CT scan. The tract was serially dilated allowing placement of a 10 [place] all-purpose drainage catheter. Appropriate positioning was confirmed with a limited postprocedural CT scan. Approximately 10 cc of blood tinged non foul smelling fluid was aspirated. The tube was connected to a JP bulb and sutured in place. A dressing was placed. The patient tolerated the above procedures well without immediate postprocedural complication. IMPRESSION: 1. Successful CT guided placement of a 10 French all purpose drain catheter into the left mid hemi abdomen with aspiration of approximately 170 cc of purulent fluid. 2. Successful CT-guided placement of a 10 French all-purpose drainage catheter into the lower pelvis via a right trans gluteal approach with aspiration of approximately 10 cc of blood tinged, non foul smelling fluid. 3. Samples were sent to the laboratory separately from both percutaneous drainage catheters as requested by the ordering clinical team. Electronically Signed   By: Sandi Mariscal M.D.   On: 06/05/2016 17:16    Labs:  CBC:  Recent Labs  06/03/16 1539 06/04/16 0240 06/05/16 0514 06/06/16 0218  WBC 10.0  8.0 7.3 7.1  HGB 10.2* 9.2* 9.7* 9.7*  HCT 32.1* 29.1* 31.1* 30.8*  PLT 393 380 443* 428*    COAGS:  Recent Labs  11/07/15 1740  05/31/16 0436 06/04/16 0240 06/05/16 0514 06/06/16 0218  INR 2.31*  < > 1.55 2.03 1.26 1.28  APTT 44*  --   --   --   --   --    < > = values in this interval not displayed.  BMP:  Recent Labs  06/03/16 1539 06/04/16 0240 06/05/16 0514 06/06/16 0218  NA 135 131* 132* 136  K 5.0 4.1 4.1 3.6  CL 102 100* 100* 103  CO2 26 25 21* 25  GLUCOSE 95 103* 112* 106*  BUN 12 7 6  5*  CALCIUM 8.4* 8.2* 8.5* 8.4*  CREATININE 0.63 0.63 0.76 0.86  GFRNONAA >60 >60 >60 >60  GFRAA >60 >60 >60 >60    LIVER FUNCTION TESTS:  Recent Labs  05/27/16 0220 05/28/16 0430 05/29/16 0148 06/03/16 1539  BILITOT 0.4 0.5 0.4 0.9  AST 18 17 15  34  ALT 17 16 13* 19  ALKPHOS 83 78 73 90  PROT 5.4* 5.4* 5.3* 5.7*  ALBUMIN 2.1* 2.2* 2.1* 2.3*    Assessment and Plan: S/p perc drainage of abdominal and pelvic abscesses Low pelvic collection may be just old blood. Would favor removal of that drain sooner if output becomes scant in next few days.  Electronically Signed: Ascencion Dike 06/06/2016, 9:31 AM   I spent a total of 15 Minutes at the the patient's bedside AND on the patient's hospital floor or unit, greater than 50% of which was counseling/coordinating care for abdominal abscess drainage

## 2016-06-07 LAB — CBC
HCT: 32.8 % — ABNORMAL LOW (ref 36.0–46.0)
HEMOGLOBIN: 10.5 g/dL — AB (ref 12.0–15.0)
MCH: 30.5 pg (ref 26.0–34.0)
MCHC: 32 g/dL (ref 30.0–36.0)
MCV: 95.3 fL (ref 78.0–100.0)
PLATELETS: 441 10*3/uL — AB (ref 150–400)
RBC: 3.44 MIL/uL — AB (ref 3.87–5.11)
RDW: 15.2 % (ref 11.5–15.5)
WBC: 7.6 10*3/uL (ref 4.0–10.5)

## 2016-06-07 LAB — BASIC METABOLIC PANEL
ANION GAP: 8 (ref 5–15)
BUN: <5 mg/dL — ABNORMAL LOW (ref 6–20)
CALCIUM: 8.6 mg/dL — AB (ref 8.9–10.3)
CO2: 22 mmol/L (ref 22–32)
CREATININE: 0.81 mg/dL (ref 0.44–1.00)
Chloride: 106 mmol/L (ref 101–111)
GFR calc non Af Amer: >60 mL/min (ref >60)
Glucose, Bld: 94 mg/dL (ref 65–99)
Potassium: 3.5 mmol/L (ref 3.5–5.1)
SODIUM: 136 mmol/L (ref 135–145)

## 2016-06-07 LAB — PROTIME-INR
INR: 1.25
PROTHROMBIN TIME: 15.8 s — AB (ref 11.4–15.2)

## 2016-06-07 MED ORDER — WARFARIN - PHARMACIST DOSING INPATIENT
Freq: Every day | Status: DC
  Administered 2016-06-10: 18:00:00

## 2016-06-07 MED ORDER — WARFARIN SODIUM 2 MG PO TABS
2.0000 mg | ORAL_TABLET | Freq: Once | ORAL | Status: AC
  Administered 2016-06-07: 2 mg via ORAL
  Filled 2016-06-07: qty 1

## 2016-06-07 MED ORDER — VITAL 1.5 CAL PO LIQD
1000.0000 mL | ORAL | Status: DC
  Administered 2016-06-07 – 2016-06-12 (×7): 1000 mL
  Filled 2016-06-07 (×8): qty 1000

## 2016-06-07 NOTE — Progress Notes (Signed)
Subjective: Nausea resolved after drains placed.    Objective: Vital signs in last 24 hours: Temp:  [97.6 F (36.4 C)-98.5 F (36.9 C)] 98.1 F (36.7 C) (10/02 0557) Pulse Rate:  [66-70] 66 (10/02 0557) Resp:  [18] 18 (10/02 0557) BP: (148-173)/(57-62) 173/60 (10/02 0557) SpO2:  [96 %-97 %] 97 % (10/02 0557) Last BM Date: 06/05/16  Intake/Output from previous day: 10/01 0701 - 10/02 0700 In: -  Out: 30 [Drains:30] Intake/Output this shift: No intake/output data recorded.  General appearance: alert, cooperative and mild distress Resp: breathing comfortably GI: soft, distended.  tender near J tube, but minimal erythema.   Drain output LUQ milky.   Right transgluteal drain cloudy serosang.    Lab Results:   Recent Labs  06/06/16 0218 06/07/16 0323  WBC 7.1 7.6  HGB 9.7* 10.5*  HCT 30.8* 32.8*  PLT 428* 441*   BMET  Recent Labs  06/06/16 0218 06/07/16 0323  NA 136 136  K 3.6 3.5  CL 103 106  CO2 25 22  GLUCOSE 106* 94  BUN 5* <5*  CREATININE 0.86 0.81  CALCIUM 8.4* 8.6*   PT/INR  Recent Labs  06/06/16 0218 06/07/16 0323  LABPROT 16.0* 15.8*  INR 1.28 1.25   ABG No results for input(s): PHART, HCO3 in the last 72 hours.  Invalid input(s): PCO2, PO2  Studies/Results: Ct Image Guided Drainage By Percutaneous Catheter  Result Date: 06/05/2016 INDICATION: History of gastric cancer, post proximal gastrectomy performed 01/2016. Gastrojejunostomy catheter placed on 05/18/2016 however patient developed postprocedural abdominal pain and distention. CT scan of the abdomen pelvis performed 06/03/2016 demonstrated development of an indeterminate fluid collection with the anterior aspect of the upper abdomen as well as the pelvic cul-de-sac. As such, request made for CT-guided placement of percutaneous drainage catheter(s) and/or aspiration. EXAM: 1. CT-GUIDED ABDOMINAL PERCUTANEOUS DRAINAGE CATHETER PLACEMENT. 2. CT-GUIDED TRANS GLUTEAL APPROACH PELVIC  PERCUTANEOUS DRAINAGE CATHETER PLACEMENT. COMPARISON:  CT abdomen pelvis - 06/05/2016; 06/03/2016 MEDICATIONS: The patient is currently admitted to the hospital and receiving intravenous antibiotics. The antibiotics were administered within an appropriate time frame prior to the initiation of the procedure. ANESTHESIA/SEDATION: Moderate (conscious) sedation was employed during this procedure. A total of Versed 2 mg and Fentanyl 100 mcg was administered intravenously. Moderate Sedation Time: 16 minutes. The patient's level of consciousness and vital signs were monitored continuously by radiology nursing throughout the procedure under my direct supervision. CONTRAST:  None COMPLICATIONS: None immediate. PROCEDURE: Informed written consent was obtained from the patient after a discussion of the risks, benefits and alternatives to treatment. The patient was initially placed supine on the CT gantry and a pre procedural CT was performed re-demonstrating the known abscess/fluid collection within the upper abdomen with dominant component measuring approximately 5.0 x 8.8 cm (image 47, series 3). The procedure was planned. A timeout was performed prior to the initiation of the procedure. The skin overlying the left lateral mid abdomen was prepped and draped in the usual sterile fashion. The overlying soft tissues were anesthetized with 1% lidocaine with epinephrine. Appropriate trajectory was planned with the use of a 22 gauge spinal needle. An 18 gauge trocar needle was advanced into the abscess/fluid collection and a short Amplatz super stiff wire was coiled within the collection. Appropriate positioning was confirmed with a limited CT scan. The tract was serially dilated allowing placement of a 10 [place] all-purpose drainage catheter. Appropriate positioning was confirmed with a limited postprocedural CT scan. Approximately 170 Ml of purulent fluid was aspirated. The tube  was connected to a JP bulb and sutured in place. A  dressing was placed. Postprocedural imaging demonstrated a near complete evacuation of the dominant fluid collection within the upper abdomen however note was made of a residual approximately 3.7 x 4.6 cm fluid collection within the lower pelvis (image 45, series 3) compatible the findings seen on preceding abdominal CT performed 06/03/2016. Given the purulent nature of the aspirated fluid from the upper abdomen, the decision was made to attempt right trans gluteal approach drainage catheter placement into this additional collection within the lower pelvis. As such, the patient was positioned left lateral decubitus. The skin overlying the right buttocks was prepped and draped in usual sterile fashion. The overlying soft tissues were anesthetized with 1% lidocaine with epinephrine. Appropriate trajectory was planned with the use of a 22 gauge spinal needle. An 18 gauge trocar needle was advanced into the abscess/fluid collection and a short Amplatz super stiff wire was coiled within the collection. Appropriate positioning was confirmed with a limited CT scan. The tract was serially dilated allowing placement of a 10 [place] all-purpose drainage catheter. Appropriate positioning was confirmed with a limited postprocedural CT scan. Approximately 10 cc of blood tinged non foul smelling fluid was aspirated. The tube was connected to a JP bulb and sutured in place. A dressing was placed. The patient tolerated the above procedures well without immediate postprocedural complication. IMPRESSION: 1. Successful CT guided placement of a 10 French all purpose drain catheter into the left mid hemi abdomen with aspiration of approximately 170 cc of purulent fluid. 2. Successful CT-guided placement of a 10 French all-purpose drainage catheter into the lower pelvis via a right trans gluteal approach with aspiration of approximately 10 cc of blood tinged, non foul smelling fluid. 3. Samples were sent to the laboratory separately from  both percutaneous drainage catheters as requested by the ordering clinical team. Electronically Signed   By: Sandi Mariscal M.D.   On: 06/05/2016 17:16   Ct Image Guided Drainage By Percutaneous Catheter  Result Date: 06/05/2016 INDICATION: History of gastric cancer, post proximal gastrectomy performed 01/2016. Gastrojejunostomy catheter placed on 05/18/2016 however patient developed postprocedural abdominal pain and distention. CT scan of the abdomen pelvis performed 06/03/2016 demonstrated development of an indeterminate fluid collection with the anterior aspect of the upper abdomen as well as the pelvic cul-de-sac. As such, request made for CT-guided placement of percutaneous drainage catheter(s) and/or aspiration. EXAM: 1. CT-GUIDED ABDOMINAL PERCUTANEOUS DRAINAGE CATHETER PLACEMENT. 2. CT-GUIDED TRANS GLUTEAL APPROACH PELVIC PERCUTANEOUS DRAINAGE CATHETER PLACEMENT. COMPARISON:  CT abdomen pelvis - 06/05/2016; 06/03/2016 MEDICATIONS: The patient is currently admitted to the hospital and receiving intravenous antibiotics. The antibiotics were administered within an appropriate time frame prior to the initiation of the procedure. ANESTHESIA/SEDATION: Moderate (conscious) sedation was employed during this procedure. A total of Versed 2 mg and Fentanyl 100 mcg was administered intravenously. Moderate Sedation Time: 16 minutes. The patient's level of consciousness and vital signs were monitored continuously by radiology nursing throughout the procedure under my direct supervision. CONTRAST:  None COMPLICATIONS: None immediate. PROCEDURE: Informed written consent was obtained from the patient after a discussion of the risks, benefits and alternatives to treatment. The patient was initially placed supine on the CT gantry and a pre procedural CT was performed re-demonstrating the known abscess/fluid collection within the upper abdomen with dominant component measuring approximately 5.0 x 8.8 cm (image 47, series 3).  The procedure was planned. A timeout was performed prior to the initiation of the procedure. The skin  overlying the left lateral mid abdomen was prepped and draped in the usual sterile fashion. The overlying soft tissues were anesthetized with 1% lidocaine with epinephrine. Appropriate trajectory was planned with the use of a 22 gauge spinal needle. An 18 gauge trocar needle was advanced into the abscess/fluid collection and a short Amplatz super stiff wire was coiled within the collection. Appropriate positioning was confirmed with a limited CT scan. The tract was serially dilated allowing placement of a 10 [place] all-purpose drainage catheter. Appropriate positioning was confirmed with a limited postprocedural CT scan. Approximately 170 Ml of purulent fluid was aspirated. The tube was connected to a JP bulb and sutured in place. A dressing was placed. Postprocedural imaging demonstrated a near complete evacuation of the dominant fluid collection within the upper abdomen however note was made of a residual approximately 3.7 x 4.6 cm fluid collection within the lower pelvis (image 45, series 3) compatible the findings seen on preceding abdominal CT performed 06/03/2016. Given the purulent nature of the aspirated fluid from the upper abdomen, the decision was made to attempt right trans gluteal approach drainage catheter placement into this additional collection within the lower pelvis. As such, the patient was positioned left lateral decubitus. The skin overlying the right buttocks was prepped and draped in usual sterile fashion. The overlying soft tissues were anesthetized with 1% lidocaine with epinephrine. Appropriate trajectory was planned with the use of a 22 gauge spinal needle. An 18 gauge trocar needle was advanced into the abscess/fluid collection and a short Amplatz super stiff wire was coiled within the collection. Appropriate positioning was confirmed with a limited CT scan. The tract was serially  dilated allowing placement of a 10 [place] all-purpose drainage catheter. Appropriate positioning was confirmed with a limited postprocedural CT scan. Approximately 10 cc of blood tinged non foul smelling fluid was aspirated. The tube was connected to a JP bulb and sutured in place. A dressing was placed. The patient tolerated the above procedures well without immediate postprocedural complication. IMPRESSION: 1. Successful CT guided placement of a 10 French all purpose drain catheter into the left mid hemi abdomen with aspiration of approximately 170 cc of purulent fluid. 2. Successful CT-guided placement of a 10 French all-purpose drainage catheter into the lower pelvis via a right trans gluteal approach with aspiration of approximately 10 cc of blood tinged, non foul smelling fluid. 3. Samples were sent to the laboratory separately from both percutaneous drainage catheters as requested by the ordering clinical team. Electronically Signed   By: Sandi Mariscal M.D.   On: 06/05/2016 17:16    Anti-infectives: Anti-infectives    Start     Dose/Rate Route Frequency Ordered Stop   06/04/16 1500  piperacillin-tazobactam (ZOSYN) IVPB 3.375 g     3.375 g 12.5 mL/hr over 240 Minutes Intravenous Every 8 hours 06/04/16 1413        Assessment/Plan: s/p * No surgery found *  Gastric cancer J tube in place N/V Anticoagulation A fib Abdominal fluid collections Chronic anemia Jehovah's witness  keep npo today x meds.  Start zosyn Abscess.   Await cultures Restart tube feeds.  On clear liquids.   Restart coumadin.     LOS: 4 days    Phyllis Abelson 10/2/2017Patient ID: [name], female   DOB: 16-Aug-1934, 82 y.o.   MRN: [id number]

## 2016-06-07 NOTE — Care Management Important Message (Signed)
Important Message  Patient Details  Name: [name] MRN: [id number] Date of Birth: 06-May-1934   Medicare Important Message Given:  Yes    Nathen May 06/07/2016, 12:41 PM

## 2016-06-07 NOTE — Discharge Instructions (Signed)

## 2016-06-07 NOTE — Progress Notes (Signed)
Referring Physician(s): Dr Stark Klein  Supervising Physician: Arne Cleveland  Patient Status:  Inpatient  Chief Complaint:  Left abd abscess and Right TG abscess drain placed 10/1  Subjective:  Feeling so much better Eating well Sleeping well Ambulating some  Allergies: Adhesive [tape]; Amiodarone; Codeine; Hydrocodone; Morphine and related; Requip [ropinirole hcl]; Zinc; and Reglan [metoclopramide]  Medications: Prior to Admission medications   Medication Sig Start Date End Date Taking? Authorizing Provider  Acetaminophen (TYLENOL) 167 MG/5ML LIQD Take 10 mLs by mouth daily as needed (for pain).   Yes Historical Provider, MD  ALPRAZolam (XANAX) 0.5 MG tablet TAKE 1/2-1 TABLET BY MOUTH 3 TIMES A DAY AS NEEDED FOR ANXIETY Patient taking differently: TAKE 1/2-1 TABLET (0.25 - 0.5 mg) BY MOUTH 3 TIMES A DAY AS NEEDED FOR ANXIETY 01/01/16  Yes Courtney Forcucci, PA-C  Cyanocobalamin 1000 MCG/ML KIT Inject 1,000 mcg into the skin as directed. Patient taking differently: Inject 1,000 mcg into the skin every 30 (thirty) days.  11/24/15  Yes Brunetta Genera, MD  diltiazem (CARDIZEM CD) 180 MG 24 hr capsule Take 1 capsule (180 mg total) by mouth daily. 05/05/16  Yes [name], MD  furosemide (LASIX) 40 MG tablet TAKE 1 TABLET (40 MG TOTAL) BY MOUTH DAILY. Patient taking differently: TAKE 1/2 TABLET (20 MG TOTAL) BY MOUTH DAILY. 02/07/16  Yes [name], MD  gabapentin (NEURONTIN) 250 MG/5ML solution Place 100 mg into feeding tube 3 (three) times daily.  04/12/16  Yes Historical Provider, MD  levothyroxine (SYNTHROID, LEVOTHROID) 100 MCG tablet TAKE 1/2 TABLET (50 MCG TOTAL) BY MOUTH EVERY DAY ON EMPTY STOMACH WITH A FULL GLASS OF WATER 02/13/16  Yes Mauricio Gerome Apley, MD  metoprolol (LOPRESSOR) 100 MG tablet Take 1 tablet (100 mg total) by mouth 2 (two) times daily. 05/05/16  Yes [name], MD  nitroGLYCERIN (NITROSTAT) 0.4 MG SL tablet Place 1 tablet (0.4 mg total)  under the tongue every 5 (five) minutes as needed for chest pain (MAX 3 TABLETS). 05/19/15  Yes Darlin Coco, MD  Nutritional Supplements (FEEDING SUPPLEMENT, VITAL 1.5 CAL,) LIQD Place 1,100 mLs into feeding tube daily. Patient taking differently: Place 35 mLs into feeding tube every hour. 35 ml per hour for 23 hours, 3.5 cans per day 03/16/16  Yes Verlee Monte, MD  ondansetron (ZOFRAN-ODT) 8 MG disintegrating tablet Take 1 tablet (8 mg total) by mouth every 8 (eight) hours as needed for nausea. 05/31/16  Yes Stark Klein, MD  oxyCODONE (ROXICODONE) 5 MG/5ML solution Place 5 mg into feeding tube every 4 (four) hours as needed for severe pain.   Yes Historical Provider, MD  pantoprazole (PROTONIX) 40 MG tablet TAKE 1 TABLET (40 MG TOTAL) BY MOUTH 2 (TWO) TIMES DAILY. 12/11/15  Yes [name], MD  potassium chloride 20 MEQ/15ML (10%) SOLN Take 15 mLs (20 mEq total) by mouth daily. j tube 05/31/16  Yes Stark Klein, MD  ranitidine (ZANTAC) 150 MG tablet Take 150 mg by mouth 2 (two) times daily.   Yes Historical Provider, MD  traMADol (ULTRAM) 50 MG tablet Take 1 tablet (50 mg total) by mouth every 6 (six) hours as needed (pain). Reported on 10/14/2015 05/31/16  Yes Stark Klein, MD  warfarin (COUMADIN) 2 MG tablet TAKE 1 TABLET DAILY OR AS DIRECTED Patient taking differently: Take 2 mg by mouth nightly (Sun/Mon/Tues/Wed/Thurs/Fri/Sat) every day 01/26/16  Yes [name], MD     Vital Signs: BP (!) 173/60 (BP Location: Left Arm)   Pulse 66  Temp 98.1 F (36.7 C) (Oral)   Resp 18   Ht '5\' 1"'$  (1.549 m)   Wt 148 lb 9.4 oz (67.4 kg)   LMP  (Exact Date)   SpO2 97%   BMI 28.08 kg/m   Physical Exam  Constitutional: She is oriented to person, place, and time.  Abdominal: Soft. Bowel sounds are normal.  Neurological: She is alert and oriented to person, place, and time.  Skin: Skin is warm and dry.  Skin sites are clean and dry NT no bleeding  Output Left abd drain - milky color 5 cc  yesterday per chart 10 cc in JP Cx: No growth  Output Rt TG drain- thin bloody material 25 cc yesterday per chart Scant this am Cx: Gr + cocci  Nursing note and vitals reviewed.   Imaging: Ct Abdomen Pelvis W Contrast  Result Date: 06/03/2016 CLINICAL DATA:  Abdominal pain and swelling. History of uterine, renal and gastric cancer. EXAM: CT ABDOMEN AND PELVIS WITH CONTRAST TECHNIQUE: Multidetector CT imaging of the abdomen and pelvis was performed using the standard protocol following bolus administration of intravenous contrast. CONTRAST:  112m ISOVUE-300 IOPAMIDOL (ISOVUE-300) INJECTION 61% COMPARISON:  CT of the abdomen 03/01/2016 FINDINGS: Lower chest: Bibasilar hypoventilatory changes. Tiny left pleural effusion. Dual lead cardiac pacemaker. Hepatobiliary: No focal liver abnormality is seen. Status post cholecystectomy. Minimal intrahepatic biliary ductal dilation. Extrahepatic common bile duct is normal for postcholecystectomy state. Pancreas: Masslike appearance of the head of the pancreas represents acute change from the prior CT dated 03/01/2016. Spleen: Normal in size without focal abnormality. Adrenals/Urinary Tract: Normal appearance of the right kidney. Post left nephrectomy. The adrenal glands are normal. 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 A percutaneous jejunostomy tube is intraluminal. No evidence of bowel obstruction. There has been interval development of rim enhancing complex fluid and gas collection within the anterior upper abdomen, which measures 16.7 by 7.2 by 2.7 cm. Surrounding inflammatory changes are noted. Vascular/Lymphatic: Aortic atherosclerosis. No enlarged abdominal or pelvic lymph nodes. Reproductive: Status post hysterectomy. No adnexal masses. Other: Enhancing fluid collection in the pelvis measures 3.2 by 4.0 by 5.3 cm. Musculoskeletal: No acute or significant osseous findings. Osteoarthritic changes  of the lumbosacral spine. Post total right hip arthroplasty. IMPRESSION: Interval development of large enhancing fluid collection within the anterior abdomen, with intermixed small foci of gas. This likely represent an abscess. Second smaller rim enhancing fluid collection within the pelvis, also consistent with development of an abscess. Masslike appearance of the head of the pancreas. Given the abrupt change of the appearance of the pancreas from the prior CT dated 03/01/2016, infectious/inflammatory changes are favored such as acute pancreatitis. Please correlate clinically. Stable postsurgical changes and stable appearance of the percutaneous jejunostomy catheter. These results were called by telephone at the time of interpretation on 06/03/2016 at 8:03 pm to Dr. PAlvino Chapel, who verbally acknowledged these results. Electronically Signed   By: DFidela SalisburyM.D.   On: 06/03/2016 20:07   Ct Image Guided Drainage By Percutaneous Catheter  Result Date: 06/05/2016 INDICATION: History of gastric cancer, post proximal gastrectomy performed 01/2016. Gastrojejunostomy catheter placed on 05/18/2016 however patient developed postprocedural abdominal pain and distention. CT scan of the abdomen pelvis performed 06/03/2016 demonstrated development of an indeterminate fluid collection with the anterior aspect of the upper abdomen as well as the pelvic cul-de-sac. As such, request made for CT-guided placement of percutaneous drainage catheter(s) and/or aspiration. EXAM: 1. CT-GUIDED ABDOMINAL PERCUTANEOUS DRAINAGE CATHETER PLACEMENT. 2.  CT-GUIDED TRANS GLUTEAL APPROACH PELVIC PERCUTANEOUS DRAINAGE CATHETER PLACEMENT. COMPARISON:  CT abdomen pelvis - 06/05/2016; 06/03/2016 MEDICATIONS: The patient is currently admitted to the hospital and receiving intravenous antibiotics. The antibiotics were administered within an appropriate time frame prior to the initiation of the procedure. ANESTHESIA/SEDATION: Moderate  (conscious) sedation was employed during this procedure. A total of Versed 2 mg and Fentanyl 100 mcg was administered intravenously. Moderate Sedation Time: 16 minutes. The patient's level of consciousness and vital signs were monitored continuously by radiology nursing throughout the procedure under my direct supervision. CONTRAST:  None COMPLICATIONS: None immediate. PROCEDURE: Informed written consent was obtained from the patient after a discussion of the risks, benefits and alternatives to treatment. The patient was initially placed supine on the CT gantry and a pre procedural CT was performed re-demonstrating the known abscess/fluid collection within the upper abdomen with dominant component measuring approximately 5.0 x 8.8 cm (image 47, series 3). The procedure was planned. A timeout was performed prior to the initiation of the procedure. The skin overlying the left lateral mid abdomen was prepped and draped in the usual sterile fashion. The overlying soft tissues were anesthetized with 1% lidocaine with epinephrine. Appropriate trajectory was planned with the use of a 22 gauge spinal needle. An 18 gauge trocar needle was advanced into the abscess/fluid collection and a short Amplatz super stiff wire was coiled within the collection. Appropriate positioning was confirmed with a limited CT scan. The tract was serially dilated allowing placement of a 10 [place] all-purpose drainage catheter. Appropriate positioning was confirmed with a limited postprocedural CT scan. Approximately 170 Ml of purulent fluid was aspirated. The tube was connected to a JP bulb and sutured in place. A dressing was placed. Postprocedural imaging demonstrated a near complete evacuation of the dominant fluid collection within the upper abdomen however note was made of a residual approximately 3.7 x 4.6 cm fluid collection within the lower pelvis (image 45, series 3) compatible the findings seen on preceding abdominal CT performed  06/03/2016. Given the purulent nature of the aspirated fluid from the upper abdomen, the decision was made to attempt right trans gluteal approach drainage catheter placement into this additional collection within the lower pelvis. As such, the patient was positioned left lateral decubitus. The skin overlying the right buttocks was prepped and draped in usual sterile fashion. The overlying soft tissues were anesthetized with 1% lidocaine with epinephrine. Appropriate trajectory was planned with the use of a 22 gauge spinal needle. An 18 gauge trocar needle was advanced into the abscess/fluid collection and a short Amplatz super stiff wire was coiled within the collection. Appropriate positioning was confirmed with a limited CT scan. The tract was serially dilated allowing placement of a 10 [place] all-purpose drainage catheter. Appropriate positioning was confirmed with a limited postprocedural CT scan. Approximately 10 cc of blood tinged non foul smelling fluid was aspirated. The tube was connected to a JP bulb and sutured in place. A dressing was placed. The patient tolerated the above procedures well without immediate postprocedural complication. IMPRESSION: 1. Successful CT guided placement of a 10 [place] all purpose drain catheter into the left mid hemi abdomen with aspiration of approximately 170 cc of purulent fluid. 2. Successful CT-guided placement of a 10 French all-purpose drainage catheter into the lower pelvis via a right trans gluteal approach with aspiration of approximately 10 cc of blood tinged, non foul smelling fluid. 3. Samples were sent to the laboratory separately from both percutaneous drainage catheters as requested by the  ordering clinical team. Electronically Signed   By: Simonne Come M.D.   On: 06/05/2016 17:16   Ct Image Guided Drainage By Percutaneous Catheter  Result Date: 06/05/2016 INDICATION: History of gastric cancer, post proximal gastrectomy performed 01/2016. Gastrojejunostomy  catheter placed on 05/18/2016 however patient developed postprocedural abdominal pain and distention. CT scan of the abdomen pelvis performed 06/03/2016 demonstrated development of an indeterminate fluid collection with the anterior aspect of the upper abdomen as well as the pelvic cul-de-sac. As such, request made for CT-guided placement of percutaneous drainage catheter(s) and/or aspiration. EXAM: 1. CT-GUIDED ABDOMINAL PERCUTANEOUS DRAINAGE CATHETER PLACEMENT. 2. CT-GUIDED TRANS GLUTEAL APPROACH PELVIC PERCUTANEOUS DRAINAGE CATHETER PLACEMENT. COMPARISON:  CT abdomen pelvis - 06/05/2016; 06/03/2016 MEDICATIONS: The patient is currently admitted to the hospital and receiving intravenous antibiotics. The antibiotics were administered within an appropriate time frame prior to the initiation of the procedure. ANESTHESIA/SEDATION: Moderate (conscious) sedation was employed during this procedure. A total of Versed 2 mg and Fentanyl 100 mcg was administered intravenously. Moderate Sedation Time: 16 minutes. The patient's level of consciousness and vital signs were monitored continuously by radiology nursing throughout the procedure under my direct supervision. CONTRAST:  None COMPLICATIONS: None immediate. PROCEDURE: Informed written consent was obtained from the patient after a discussion of the risks, benefits and alternatives to treatment. The patient was initially placed supine on the CT gantry and a pre procedural CT was performed re-demonstrating the known abscess/fluid collection within the upper abdomen with dominant component measuring approximately 5.0 x 8.8 cm (image 47, series 3). The procedure was planned. A timeout was performed prior to the initiation of the procedure. The skin overlying the left lateral mid abdomen was prepped and draped in the usual sterile fashion. The overlying soft tissues were anesthetized with 1% lidocaine with epinephrine. Appropriate trajectory was planned with the use of a 22  gauge spinal needle. An 18 gauge trocar needle was advanced into the abscess/fluid collection and a short Amplatz super stiff wire was coiled within the collection. Appropriate positioning was confirmed with a limited CT scan. The tract was serially dilated allowing placement of a 10 [place] all-purpose drainage catheter. Appropriate positioning was confirmed with a limited postprocedural CT scan. Approximately 170 Ml of purulent fluid was aspirated. The tube was connected to a JP bulb and sutured in place. A dressing was placed. Postprocedural imaging demonstrated a near complete evacuation of the dominant fluid collection within the upper abdomen however note was made of a residual approximately 3.7 x 4.6 cm fluid collection within the lower pelvis (image 45, series 3) compatible the findings seen on preceding abdominal CT performed 06/03/2016. Given the purulent nature of the aspirated fluid from the upper abdomen, the decision was made to attempt right trans gluteal approach drainage catheter placement into this additional collection within the lower pelvis. As such, the patient was positioned left lateral decubitus. The skin overlying the right buttocks was prepped and draped in usual sterile fashion. The overlying soft tissues were anesthetized with 1% lidocaine with epinephrine. Appropriate trajectory was planned with the use of a 22 gauge spinal needle. An 18 gauge trocar needle was advanced into the abscess/fluid collection and a short Amplatz super stiff wire was coiled within the collection. Appropriate positioning was confirmed with a limited CT scan. The tract was serially dilated allowing placement of a 10 [place] all-purpose drainage catheter. Appropriate positioning was confirmed with a limited postprocedural CT scan. Approximately 10 cc of blood tinged non foul smelling fluid was aspirated. The tube was  connected to a JP bulb and sutured in place. A dressing was placed. The patient tolerated the above  procedures well without immediate postprocedural complication. IMPRESSION: 1. Successful CT guided placement of a 10 French all purpose drain catheter into the left mid hemi abdomen with aspiration of approximately 170 cc of purulent fluid. 2. Successful CT-guided placement of a 10 French all-purpose drainage catheter into the lower pelvis via a right trans gluteal approach with aspiration of approximately 10 cc of blood tinged, non foul smelling fluid. 3. Samples were sent to the laboratory separately from both percutaneous drainage catheters as requested by the ordering clinical team. Electronically Signed   By: Sandi Mariscal M.D.   On: 06/05/2016 17:16    Labs:  CBC:  Recent Labs  06/04/16 0240 06/05/16 0514 06/06/16 0218 06/07/16 0323  WBC 8.0 7.3 7.1 7.6  HGB 9.2* 9.7* 9.7* 10.5*  HCT 29.1* 31.1* 30.8* 32.8*  PLT 380 443* 428* 441*    COAGS:  Recent Labs  11/07/15 1740  06/04/16 0240 06/05/16 0514 06/06/16 0218 06/07/16 0323  INR 2.31*  < > 2.03 1.26 1.28 1.25  APTT 44*  --   --   --   --   --   < > = values in this interval not displayed.  BMP:  Recent Labs  06/04/16 0240 06/05/16 0514 06/06/16 0218 06/07/16 0323  NA 131* 132* 136 136  K 4.1 4.1 3.6 3.5  CL 100* 100* 103 106  CO2 25 21* 25 22  GLUCOSE 103* 112* 106* 94  BUN 7 6 5* <5*  CALCIUM 8.2* 8.5* 8.4* 8.6*  CREATININE 0.63 0.76 0.86 0.81  GFRNONAA >60 >60 >60 >60  GFRAA >60 >60 >60 >60    LIVER FUNCTION TESTS:  Recent Labs  05/27/16 0220 05/28/16 0430 05/29/16 0148 06/03/16 1539  BILITOT 0.4 0.5 0.4 0.9  AST '18 17 15 '$ 34  ALT 17 16 13* 19  ALKPHOS 83 78 73 90  PROT 5.4* 5.4* 5.3* 5.7*  ALBUMIN 2.1* 2.2* 2.1* 2.3*    Assessment and Plan:  Abd/pelvic abscesses Drains intact Will need re CT when output less than 10cc/daily Will follow Plan per Dr Barry Dienes  Electronically Signed: Monia Sabal A 06/07/2016, 7:52 AM   I spent a total of 15 Minutes at the the patient's bedside AND on  the patient's hospital floor or unit, greater than 50% of which was counseling/coordinating care for abscess drains

## 2016-06-07 NOTE — Progress Notes (Signed)
ANTICOAGULATION CONSULT NOTE - Follow Up Consult  Pharmacy Consult:  Coumadin Indication: atrial fibrillation   Assessment: 82yof continues on coumadin for hx afib and CVA.  Coumadin has been on hold since 9/28 and vitamin k was given on 9/29 after CT noted abdominal abscess requiring drains. INR has now drifted down to 1.2 and has stayed there the past three days. Surgery ok with resuming warfarin tonight with conservative management.   Home Coumadin regimen:  2 mg daily  Goal of Therapy:  INR 2-3  Monitor platelets by anticoagulation protocol: yes  Plan:  -Coumadin 2mg  tonight with plans to slowly titrate as needed -Daily INR -Monitor for s/sx bleeding    Allergies  Allergen Reactions  . Adhesive [Tape] Other (See Comments)    Tears skin, Please use "paper" tape  . Amiodarone Other (See Comments)     Thyroid and liver and kidney problems  . Codeine Nausea And Vomiting  . Hydrocodone Other (See Comments)    hallucination  . Morphine And Related Nausea And Vomiting  . Requip [Ropinirole Hcl] Other (See Comments)    Headache   . Zinc Nausea Only    nausea  . Reglan [Metoclopramide] Hives, Itching and Rash    Made face red, also   Patient Measurements: Height: 5\' 1"  (154.9 cm) Weight: 148 lb 9.4 oz (67.4 kg) IBW/kg (Calculated) : 47.8  Vital Signs: Temp: 98.1 F (36.7 C) (10/02 0557) Temp Source: Oral (10/02 0557) BP: 155/81 (10/02 1000) Pulse Rate: 72 (10/02 1000)  Labs:  Recent Labs  06/05/16 0514 06/06/16 0218 06/07/16 0323  HGB 9.7* 9.7* 10.5*  HCT 31.1* 30.8* 32.8*  PLT 443* 428* 441*  LABPROT 15.9* 16.0* 15.8*  INR 1.26 1.28 1.25  CREATININE 0.76 0.86 0.81   Estimated Creatinine Clearance: 48.6 mL/min (by C-G formula based on SCr of 0.81 mg/dL).  Erin Hearing PharmD., BCPS Clinical Pharmacist Pager [phone] 06/07/2016 12:51 PM

## 2016-06-08 LAB — PROTIME-INR
INR: 1.27
PROTHROMBIN TIME: 16 s — AB (ref 11.4–15.2)

## 2016-06-08 MED ORDER — WARFARIN SODIUM 2 MG PO TABS
2.0000 mg | ORAL_TABLET | Freq: Once | ORAL | Status: AC
  Administered 2016-06-08: 2 mg via ORAL
  Filled 2016-06-08: qty 1

## 2016-06-08 NOTE — Progress Notes (Signed)
Referring Physician(s): Dr Stark Klein  Supervising Physician: Corrie Mckusick  Patient Status:  Inpatient  Chief Complaint:  Left abdomen abscess and Rt TG abscess drains placed 10/1  Subjective:  Better daily! Up in chair Eating; sleeping Output decreasing from both drains   Allergies: Adhesive [tape]; Amiodarone; Codeine; Hydrocodone; Morphine and related; Requip [ropinirole hcl]; Zinc; and Reglan [metoclopramide]  Medications: Prior to Admission medications   Medication Sig Start Date End Date Taking? Authorizing Provider  Acetaminophen (TYLENOL) 167 MG/5ML LIQD Take 10 mLs by mouth daily as needed (for pain).   Yes Historical Provider, MD  ALPRAZolam (XANAX) 0.5 MG tablet TAKE 1/2-1 TABLET BY MOUTH 3 TIMES A DAY AS NEEDED FOR ANXIETY Patient taking differently: TAKE 1/2-1 TABLET (0.25 - 0.5 mg) BY MOUTH 3 TIMES A DAY AS NEEDED FOR ANXIETY 01/01/16  Yes Courtney Forcucci, PA-C  Cyanocobalamin 1000 MCG/ML KIT Inject 1,000 mcg into the skin as directed. Patient taking differently: Inject 1,000 mcg into the skin every 30 (thirty) days.  11/24/15  Yes Brunetta Genera, MD  diltiazem (CARDIZEM CD) 180 MG 24 hr capsule Take 1 capsule (180 mg total) by mouth daily. 05/05/16  Yes [name], MD  furosemide (LASIX) 40 MG tablet TAKE 1 TABLET (40 MG TOTAL) BY MOUTH DAILY. Patient taking differently: TAKE 1/2 TABLET (20 MG TOTAL) BY MOUTH DAILY. 02/07/16  Yes [name], MD  gabapentin (NEURONTIN) 250 MG/5ML solution Place 100 mg into feeding tube 3 (three) times daily.  04/12/16  Yes Historical Provider, MD  levothyroxine (SYNTHROID, LEVOTHROID) 100 MCG tablet TAKE 1/2 TABLET (50 MCG TOTAL) BY MOUTH EVERY DAY ON EMPTY STOMACH WITH A FULL GLASS OF WATER 02/13/16  Yes Mauricio Gerome Apley, MD  metoprolol (LOPRESSOR) 100 MG tablet Take 1 tablet (100 mg total) by mouth 2 (two) times daily. 05/05/16  Yes [name], MD  nitroGLYCERIN (NITROSTAT) 0.4 MG SL tablet Place 1 tablet  (0.4 mg total) under the tongue every 5 (five) minutes as needed for chest pain (MAX 3 TABLETS). 05/19/15  Yes Darlin Coco, MD  Nutritional Supplements (FEEDING SUPPLEMENT, VITAL 1.5 CAL,) LIQD Place 1,100 mLs into feeding tube daily. Patient taking differently: Place 35 mLs into feeding tube every hour. 35 ml per hour for 23 hours, 3.5 cans per day 03/16/16  Yes Verlee Monte, MD  ondansetron (ZOFRAN-ODT) 8 MG disintegrating tablet Take 1 tablet (8 mg total) by mouth every 8 (eight) hours as needed for nausea. 05/31/16  Yes Stark Klein, MD  oxyCODONE (ROXICODONE) 5 MG/5ML solution Place 5 mg into feeding tube every 4 (four) hours as needed for severe pain.   Yes Historical Provider, MD  pantoprazole (PROTONIX) 40 MG tablet TAKE 1 TABLET (40 MG TOTAL) BY MOUTH 2 (TWO) TIMES DAILY. 12/11/15  Yes [name], MD  potassium chloride 20 MEQ/15ML (10%) SOLN Take 15 mLs (20 mEq total) by mouth daily. j tube 05/31/16  Yes Stark Klein, MD  ranitidine (ZANTAC) 150 MG tablet Take 150 mg by mouth 2 (two) times daily.   Yes Historical Provider, MD  traMADol (ULTRAM) 50 MG tablet Take 1 tablet (50 mg total) by mouth every 6 (six) hours as needed (pain). Reported on 10/14/2015 05/31/16  Yes Stark Klein, MD  warfarin (COUMADIN) 2 MG tablet TAKE 1 TABLET DAILY OR AS DIRECTED Patient taking differently: Take 2 mg by mouth nightly (Sun/Mon/Tues/Wed/Thurs/Fri/Sat) every day 01/26/16  Yes [name], MD     Vital Signs: BP (!) 156/55   Pulse 66   Temp  98.3 F (36.8 C) (Oral)   Resp 18   Ht _0  (1.549 m)   Wt 148 lb 9.4 oz (67.4 kg)   LMP  (Exact Date)   SpO2 100%   BMI 28.08 kg/m   Physical Exam  Constitutional: She is oriented to person, place, and time.  Abdominal: Soft. Bowel sounds are normal.  Musculoskeletal: Normal range of motion.  Neurological: She is alert and oriented to person, place, and time.  Skin: Skin is warm and dry.  Sites of drains are clean and dry NT No bleeding Output  TG drain is minimal; old blood Output L abd drain  Milky; 30 cc yesterday  Nursing note and vitals reviewed.   Imaging: Ct Image Guided Drainage By Percutaneous Catheter  Result Date: 06/05/2016 INDICATION: History of gastric cancer, post proximal gastrectomy performed 01/2016. Gastrojejunostomy catheter placed on 05/18/2016 however patient developed postprocedural abdominal pain and distention. CT scan of the abdomen pelvis performed 06/03/2016 demonstrated development of an indeterminate fluid collection with the anterior aspect of the upper abdomen as well as the pelvic cul-de-sac. As such, request made for CT-guided placement of percutaneous drainage catheter(s) and/or aspiration. EXAM: 1. CT-GUIDED ABDOMINAL PERCUTANEOUS DRAINAGE CATHETER PLACEMENT. 2. CT-GUIDED TRANS GLUTEAL APPROACH PELVIC PERCUTANEOUS DRAINAGE CATHETER PLACEMENT. COMPARISON:  CT abdomen pelvis - 06/05/2016; 06/03/2016 MEDICATIONS: The patient is currently admitted to the hospital and receiving intravenous antibiotics. The antibiotics were administered within an appropriate time frame prior to the initiation of the procedure. ANESTHESIA/SEDATION: Moderate (conscious) sedation was employed during this procedure. A total of Versed 2 mg and Fentanyl 100 mcg was administered intravenously. Moderate Sedation Time: 16 minutes. The patient's level of consciousness and vital signs were monitored continuously by radiology nursing throughout the procedure under my direct supervision. CONTRAST:  None COMPLICATIONS: None immediate. PROCEDURE: Informed written consent was obtained from the patient after a discussion of the risks, benefits and alternatives to treatment. The patient was initially placed supine on the CT gantry and a pre procedural CT was performed re-demonstrating the known abscess/fluid collection within the upper abdomen with dominant component measuring approximately 5.0 x 8.8 cm (image 47, series 3). The procedure was planned. A  timeout was performed prior to the initiation of the procedure. The skin overlying the left lateral mid abdomen was prepped and draped in the usual sterile fashion. The overlying soft tissues were anesthetized with 1% lidocaine with epinephrine. Appropriate trajectory was planned with the use of a 22 gauge spinal needle. An 18 gauge trocar needle was advanced into the abscess/fluid collection and a short Amplatz super stiff wire was coiled within the collection. Appropriate positioning was confirmed with a limited CT scan. The tract was serially dilated allowing placement of a 10 [place] all-purpose drainage catheter. Appropriate positioning was confirmed with a limited postprocedural CT scan. Approximately 170 Ml of purulent fluid was aspirated. The tube was connected to a JP bulb and sutured in place. A dressing was placed. Postprocedural imaging demonstrated a near complete evacuation of the dominant fluid collection within the upper abdomen however note was made of a residual approximately 3.7 x 4.6 cm fluid collection within the lower pelvis (image 45, series 3) compatible the findings seen on preceding abdominal CT performed 06/03/2016. Given the purulent nature of the aspirated fluid from the upper abdomen, the decision was made to attempt right trans gluteal approach drainage catheter placement into this additional collection within the lower pelvis. As such, the patient was positioned left lateral decubitus. The skin overlying the right buttocks  was prepped and draped in usual sterile fashion. The overlying soft tissues were anesthetized with 1% lidocaine with epinephrine. Appropriate trajectory was planned with the use of a 22 gauge spinal needle. An 18 gauge trocar needle was advanced into the abscess/fluid collection and a short Amplatz super stiff wire was coiled within the collection. Appropriate positioning was confirmed with a limited CT scan. The tract was serially dilated allowing placement of a 10  [place] all-purpose drainage catheter. Appropriate positioning was confirmed with a limited postprocedural CT scan. Approximately 10 cc of blood tinged non foul smelling fluid was aspirated. The tube was connected to a JP bulb and sutured in place. A dressing was placed. The patient tolerated the above procedures well without immediate postprocedural complication. IMPRESSION: 1. Successful CT guided placement of a 10 French all purpose drain catheter into the left mid hemi abdomen with aspiration of approximately 170 cc of purulent fluid. 2. Successful CT-guided placement of a 10 French all-purpose drainage catheter into the lower pelvis via a right trans gluteal approach with aspiration of approximately 10 cc of blood tinged, non foul smelling fluid. 3. Samples were sent to the laboratory separately from both percutaneous drainage catheters as requested by the ordering clinical team. Electronically Signed   By: Sandi Mariscal M.D.   On: 06/05/2016 17:16   Ct Image Guided Drainage By Percutaneous Catheter  Result Date: 06/05/2016 INDICATION: History of gastric cancer, post proximal gastrectomy performed 01/2016. Gastrojejunostomy catheter placed on 05/18/2016 however patient developed postprocedural abdominal pain and distention. CT scan of the abdomen pelvis performed 06/03/2016 demonstrated development of an indeterminate fluid collection with the anterior aspect of the upper abdomen as well as the pelvic cul-de-sac. As such, request made for CT-guided placement of percutaneous drainage catheter(s) and/or aspiration. EXAM: 1. CT-GUIDED ABDOMINAL PERCUTANEOUS DRAINAGE CATHETER PLACEMENT. 2. CT-GUIDED TRANS GLUTEAL APPROACH PELVIC PERCUTANEOUS DRAINAGE CATHETER PLACEMENT. COMPARISON:  CT abdomen pelvis - 06/05/2016; 06/03/2016 MEDICATIONS: The patient is currently admitted to the hospital and receiving intravenous antibiotics. The antibiotics were administered within an appropriate time frame prior to the initiation  of the procedure. ANESTHESIA/SEDATION: Moderate (conscious) sedation was employed during this procedure. A total of Versed 2 mg and Fentanyl 100 mcg was administered intravenously. Moderate Sedation Time: 16 minutes. The patient's level of consciousness and vital signs were monitored continuously by radiology nursing throughout the procedure under my direct supervision. CONTRAST:  None COMPLICATIONS: None immediate. PROCEDURE: Informed written consent was obtained from the patient after a discussion of the risks, benefits and alternatives to treatment. The patient was initially placed supine on the CT gantry and a pre procedural CT was performed re-demonstrating the known abscess/fluid collection within the upper abdomen with dominant component measuring approximately 5.0 x 8.8 cm (image 47, series 3). The procedure was planned. A timeout was performed prior to the initiation of the procedure. The skin overlying the left lateral mid abdomen was prepped and draped in the usual sterile fashion. The overlying soft tissues were anesthetized with 1% lidocaine with epinephrine. Appropriate trajectory was planned with the use of a 22 gauge spinal needle. An 18 gauge trocar needle was advanced into the abscess/fluid collection and a short Amplatz super stiff wire was coiled within the collection. Appropriate positioning was confirmed with a limited CT scan. The tract was serially dilated allowing placement of a 10 [place] all-purpose drainage catheter. Appropriate positioning was confirmed with a limited postprocedural CT scan. Approximately 170 Ml of purulent fluid was aspirated. The tube was connected to a JP bulb  and sutured in place. A dressing was placed. Postprocedural imaging demonstrated a near complete evacuation of the dominant fluid collection within the upper abdomen however note was made of a residual approximately 3.7 x 4.6 cm fluid collection within the lower pelvis (image 45, series 3) compatible the findings  seen on preceding abdominal CT performed 06/03/2016. Given the purulent nature of the aspirated fluid from the upper abdomen, the decision was made to attempt right trans gluteal approach drainage catheter placement into this additional collection within the lower pelvis. As such, the patient was positioned left lateral decubitus. The skin overlying the right buttocks was prepped and draped in usual sterile fashion. The overlying soft tissues were anesthetized with 1% lidocaine with epinephrine. Appropriate trajectory was planned with the use of a 22 gauge spinal needle. An 18 gauge trocar needle was advanced into the abscess/fluid collection and a short Amplatz super stiff wire was coiled within the collection. Appropriate positioning was confirmed with a limited CT scan. The tract was serially dilated allowing placement of a 10 [place] all-purpose drainage catheter. Appropriate positioning was confirmed with a limited postprocedural CT scan. Approximately 10 cc of blood tinged non foul smelling fluid was aspirated. The tube was connected to a JP bulb and sutured in place. A dressing was placed. The patient tolerated the above procedures well without immediate postprocedural complication. IMPRESSION: 1. Successful CT guided placement of a 10 French all purpose drain catheter into the left mid hemi abdomen with aspiration of approximately 170 cc of purulent fluid. 2. Successful CT-guided placement of a 10 French all-purpose drainage catheter into the lower pelvis via a right trans gluteal approach with aspiration of approximately 10 cc of blood tinged, non foul smelling fluid. 3. Samples were sent to the laboratory separately from both percutaneous drainage catheters as requested by the ordering clinical team. Electronically Signed   By: Sandi Mariscal M.D.   On: 06/05/2016 17:16    Labs:  CBC:  Recent Labs  06/04/16 0240 06/05/16 0514 06/06/16 0218 06/07/16 0323  WBC 8.0 7.3 7.1 7.6  HGB 9.2* 9.7* 9.7*  10.5*  HCT 29.1* 31.1* 30.8* 32.8*  PLT 380 443* 428* 441*    COAGS:  Recent Labs  11/07/15 1740  06/05/16 0514 06/06/16 0218 06/07/16 0323 06/08/16 0242  INR 2.31*  < > 1.26 1.28 1.25 1.27  APTT 44*  --   --   --   --   --   < > = values in this interval not displayed.  BMP:  Recent Labs  06/04/16 0240 06/05/16 0514 06/06/16 0218 06/07/16 0323  NA 131* 132* 136 136  K 4.1 4.1 3.6 3.5  CL 100* 100* 103 106  CO2 25 21* 25 22  GLUCOSE 103* 112* 106* 94  BUN 7 6 5* <5*  CALCIUM 8.2* 8.5* 8.4* 8.6*  CREATININE 0.63 0.76 0.86 0.81  GFRNONAA >60 >60 >60 >60  GFRAA >60 >60 >60 >60    LIVER FUNCTION TESTS:  Recent Labs  05/27/16 0220 05/28/16 0430 05/29/16 0148 06/03/16 1539  BILITOT 0.4 0.5 0.4 0.9  AST _0 34  ALT 17 16 13* 19  ALKPHOS 83 78 73 90  PROT 5.4* 5.4* 5.3* 5.7*  ALBUMIN 2.1* 2.2* 2.1* 2.3*    Assessment and Plan:  Output for both drains decreasing Will need CT next 24-48 hrs Plan per Dr Barry Dienes  Electronically Signed: Monia Sabal A 06/08/2016, 11:35 AM   I spent a total of 15 Minutes at the the patient's  bedside AND on the patient's hospital floor or unit, greater than 50% of which was counseling/coordinating care for abscess drains

## 2016-06-08 NOTE — Progress Notes (Signed)
ANTICOAGULATION CONSULT NOTE - Follow Up Consult  Pharmacy Consult:  Coumadin Indication: atrial fibrillation   Assessment: 82yof continues on coumadin for hx afib and CVA.  Coumadin has been on hold since 9/28 and vitamin k was given on 9/29 after CT noted abdominal abscess requiring drains.   INR relatively unchanged overnight at 1.2. Conservative dosing requested by surgery. Will repeat 2mg  again tonight if little change in INR is noted in am will start titrating up dose slowly.   Home Coumadin regimen:  2 mg daily  Goal of Therapy:  INR 2-3  Monitor platelets by anticoagulation protocol: yes  Plan:  -Coumadin 2mg  tonight with plans to slowly titrate as needed -Daily INR -Monitor for s/sx bleeding    Allergies  Allergen Reactions  . Adhesive [Tape] Other (See Comments)    Tears skin, Please use "paper" tape  . Amiodarone Other (See Comments)     Thyroid and liver and kidney problems  . Codeine Nausea And Vomiting  . Hydrocodone Other (See Comments)    hallucination  . Morphine And Related Nausea And Vomiting  . Requip [Ropinirole Hcl] Other (See Comments)    Headache   . Zinc Nausea Only    nausea  . Reglan [Metoclopramide] Hives, Itching and Rash    Made face red, also   Patient Measurements: Height: 5\' 1"  (154.9 cm) Weight: 148 lb 9.4 oz (67.4 kg) IBW/kg (Calculated) : 47.8  Vital Signs: Temp: 98.3 F (36.8 C) (10/03 0517) Temp Source: Oral (10/03 0517) BP: 156/55 (10/03 1109) Pulse Rate: 85 (10/03 1109)  Labs:  Recent Labs  06/06/16 0218 06/07/16 0323 06/08/16 0242  HGB 9.7* 10.5*  --   HCT 30.8* 32.8*  --   PLT 428* 441*  --   LABPROT 16.0* 15.8* 16.0*  INR 1.28 1.25 1.27  CREATININE 0.86 0.81  --    Estimated Creatinine Clearance: 48.6 mL/min (by C-G formula based on SCr of 0.81 mg/dL).  [name] PharmD., BCPS Clinical Pharmacist Pager [phone] 06/08/2016 2:37 PM

## 2016-06-08 NOTE — Progress Notes (Signed)
  Subjective: Had one time nausea yesterday.    Objective: Vital signs in last 24 hours: Temp:  [98.3 F (36.8 C)] 98.3 F (36.8 C) (10/03 0517) Pulse Rate:  [65-66] 66 (10/03 0517) Resp:  [18] 18 (10/02 1936) BP: (150-173)/(53-64) 156/55 (10/03 1109) SpO2:  [99 %-100 %] 100 % (10/03 0517) Last BM Date: 06/05/16  Intake/Output from previous day: 10/02 0701 - 10/03 0700 In: -  Out: 33 [Drains:33] Intake/Output this shift: Total I/O In: 360 [P.O.:360] Out: -   General appearance: alert, cooperative and mild distress Resp: breathing comfortably GI: soft, distended.  tender near J tube, but minimal erythema.   Drain output LUQ less milkly   Right transgluteal drain scant Lab Results:   Recent Labs  06/06/16 0218 06/07/16 0323  WBC 7.1 7.6  HGB 9.7* 10.5*  HCT 30.8* 32.8*  PLT 428* 441*   BMET  Recent Labs  06/06/16 0218 06/07/16 0323  NA 136 136  K 3.6 3.5  CL 103 106  CO2 25 22  GLUCOSE 106* 94  BUN 5* <5*  CREATININE 0.86 0.81  CALCIUM 8.4* 8.6*   PT/INR  Recent Labs  06/07/16 0323 06/08/16 0242  LABPROT 15.8* 16.0*  INR 1.25 1.27   ABG No results for input(s): PHART, HCO3 in the last 72 hours.  Invalid input(s): PCO2, PO2  Studies/Results: No results found.  Anti-infectives: Anti-infectives    Start     Dose/Rate Route Frequency Ordered Stop   06/04/16 1500  piperacillin-tazobactam (ZOSYN) IVPB 3.375 g     3.375 g 12.5 mL/hr over 240 Minutes Intravenous Every 8 hours 06/04/16 1413        Assessment/Plan: s/p * No surgery found *  Gastric cancer J tube in place N/V Anticoagulation A fib Abdominal fluid collections Chronic anemia Jehovah's witness  keep npo today x meds.  Zosyn.  NGTD Abscess.   Await cultures Slow advance of tube feeds.   Coumadin for atrial fibrillation.   May be able to get right transgluteal drain out prior to d/c.     LOS: 5 days    [name] 06/08/2016

## 2016-06-09 LAB — PROTIME-INR
INR: 1.31
Prothrombin Time: 16.4 seconds — ABNORMAL HIGH (ref 11.4–15.2)

## 2016-06-09 MED ORDER — WARFARIN SODIUM 4 MG PO TABS
4.0000 mg | ORAL_TABLET | Freq: Once | ORAL | Status: AC
  Administered 2016-06-09: 4 mg via ORAL
  Filled 2016-06-09: qty 1

## 2016-06-09 NOTE — Progress Notes (Signed)
  Subjective: Continues to feel better.  Tolerated tube feed increase.   Objective: Vital signs in last 24 hours: Temp:  [98 F (36.7 C)-98.4 F (36.9 C)] 98.4 F (36.9 C) (10/04 1300) Pulse Rate:  [71-82] 82 (10/04 1300) Resp:  [18] 18 (10/04 1300) BP: (146-175)/(60-68) 175/68 (10/04 1300) SpO2:  [98 %-100 %] 100 % (10/04 1300) Last BM Date: 06/05/16  Intake/Output from previous day: 10/03 0701 - 10/04 0700 In: 360 [P.O.:360] Out: 12 [Drains:12] Intake/Output this shift: Total I/O In: 840 [P.O.:840] Out: -   General appearance: alert, cooperative and mild distress Resp: breathing comfortably GI: soft, distended.  tender near J tube, but minimal erythema.   Milky LUQ drain.  Serosang pelvic drain (right transgluteal).  Lab Results:   Recent Labs  06/07/16 0323  WBC 7.6  HGB 10.5*  HCT 32.8*  PLT 441*   BMET  Recent Labs  06/07/16 0323  NA 136  K 3.5  CL 106  CO2 22  GLUCOSE 94  BUN <5*  CREATININE 0.81  CALCIUM 8.6*   PT/INR  Recent Labs  06/08/16 0242 06/09/16 0308  LABPROT 16.0* 16.4*  INR 1.27 1.31   ABG No results for input(s): PHART, HCO3 in the last 72 hours.  Invalid input(s): PCO2, PO2  Studies/Results: No results found.  Anti-infectives: Anti-infectives    Start     Dose/Rate Route Frequency Ordered Stop   06/04/16 1500  piperacillin-tazobactam (ZOSYN) IVPB 3.375 g     3.375 g 12.5 mL/hr over 240 Minutes Intravenous Every 8 hours 06/04/16 1413        Assessment/Plan: s/p * No surgery found *  Gastric cancer J tube in place N/V Anticoagulation A fib Abdominal fluid collections Chronic anemia Jehovah's witness  Full liquids and tube feeds.  Should be at goal feeds tomorrow.  May be able to start cycling.   Zosyn. Enterococcus and klebsiella isolated from transgluteal drain.   Abscess.   Repeat CT scan tomorrow.   Coumadin for atrial fibrillation.   May be able to get right transgluteal drain out prior to d/c.      LOS: 6 days    [name] 06/09/2016

## 2016-06-09 NOTE — Progress Notes (Signed)
ANTICOAGULATION CONSULT NOTE - Follow Up Consult  Pharmacy Consult:  Coumadin Indication: atrial fibrillation   Assessment: 82yof continues on coumadin for hx afib and CVA.  Coumadin was on hold since 9/28 and vitamin k was given on 9/29 after CT noted abdominal abscess requiring drains.   INR relatively unchanged overnight at 1.3. Conservative dosing requested by surgery. Will increase dose to 4mg  tonight, titrating up dose slowly.   Home Coumadin regimen:  2 mg daily  Goal of Therapy:  INR 2-3  Monitor platelets by anticoagulation protocol: yes  Plan:  -Coumadin 4mg  tonight -Daily INR -Monitor for s/sx bleeding    Allergies  Allergen Reactions  . Adhesive [Tape] Other (See Comments)    Tears skin, Please use "paper" tape  . Amiodarone Other (See Comments)     Thyroid and liver and kidney problems  . Codeine Nausea And Vomiting  . Hydrocodone Other (See Comments)    hallucination  . Morphine And Related Nausea And Vomiting  . Requip [Ropinirole Hcl] Other (See Comments)    Headache   . Zinc Nausea Only    nausea  . Reglan [Metoclopramide] Hives, Itching and Rash    Made face red, also   Patient Measurements: Height: 5\' 1"  (154.9 cm) Weight: 148 lb 9.4 oz (67.4 kg) IBW/kg (Calculated) : 47.8  Vital Signs: Temp: 98 F (36.7 C) (10/04 0450) Temp Source: Oral (10/04 0450) BP: 157/65 (10/04 0450) Pulse Rate: 71 (10/04 0450)  Labs:  Recent Labs  06/07/16 0323 06/08/16 0242 06/09/16 0308  HGB 10.5*  --   --   HCT 32.8*  --   --   PLT 441*  --   --   LABPROT 15.8* 16.0* 16.4*  INR 1.25 1.27 1.31  CREATININE 0.81  --   --    Estimated Creatinine Clearance: 48.6 mL/min (by C-G formula based on SCr of 0.81 mg/dL).  Erin Hearing PharmD., BCPS Clinical Pharmacist Pager [phone] 06/09/2016 11:47 AM

## 2016-06-09 NOTE — Care Management Important Message (Signed)
Important Message  Patient Details  Name: [name] MRN: [id number] Date of Birth: 03-31-1934   Medicare Important Message Given:  Yes    [name] 06/09/2016, 10:37 AM

## 2016-06-09 NOTE — Progress Notes (Signed)
Referring Physician(s): Dr Mamie Laurel  Supervising Physician: Sandi Mariscal  Patient Status:  Inpatient  Chief Complaint:  Left abd abscess and pelvic abscess  Subjective:  L abd abscess drain and Rt TG abscess drain placed 10/1 Pt so much better OP decreasing nicely for both drains + enterococcus and klebsiella from TG abscess  Allergies: Adhesive [tape]; Amiodarone; Codeine; Hydrocodone; Morphine and related; Requip [ropinirole hcl]; Zinc; and Reglan [metoclopramide]  Medications: Prior to Admission medications   Medication Sig Start Date End Date Taking? Authorizing Provider  Acetaminophen (TYLENOL) 167 MG/5ML LIQD Take 10 mLs by mouth daily as needed (for pain).   Yes Historical Provider, MD  ALPRAZolam (XANAX) 0.5 MG tablet TAKE 1/2-1 TABLET BY MOUTH 3 TIMES A DAY AS NEEDED FOR ANXIETY Patient taking differently: TAKE 1/2-1 TABLET (0.25 - 0.5 mg) BY MOUTH 3 TIMES A DAY AS NEEDED FOR ANXIETY 01/01/16  Yes Courtney Forcucci, PA-C  Cyanocobalamin 1000 MCG/ML KIT Inject 1,000 mcg into the skin as directed. Patient taking differently: Inject 1,000 mcg into the skin every 30 (thirty) days.  11/24/15  Yes Brunetta Genera, MD  diltiazem (CARDIZEM CD) 180 MG 24 hr capsule Take 1 capsule (180 mg total) by mouth daily. 05/05/16  Yes [name], MD  furosemide (LASIX) 40 MG tablet TAKE 1 TABLET (40 MG TOTAL) BY MOUTH DAILY. Patient taking differently: TAKE 1/2 TABLET (20 MG TOTAL) BY MOUTH DAILY. 02/07/16  Yes [name], MD  gabapentin (NEURONTIN) 250 MG/5ML solution Place 100 mg into feeding tube 3 (three) times daily.  04/12/16  Yes Historical Provider, MD  levothyroxine (SYNTHROID, LEVOTHROID) 100 MCG tablet TAKE 1/2 TABLET (50 MCG TOTAL) BY MOUTH EVERY DAY ON EMPTY STOMACH WITH A FULL GLASS OF WATER 02/13/16  Yes Mauricio Gerome Apley, MD  metoprolol (LOPRESSOR) 100 MG tablet Take 1 tablet (100 mg total) by mouth 2 (two) times daily. 05/05/16  Yes [name], MD    nitroGLYCERIN (NITROSTAT) 0.4 MG SL tablet Place 1 tablet (0.4 mg total) under the tongue every 5 (five) minutes as needed for chest pain (MAX 3 TABLETS). 05/19/15  Yes Darlin Coco, MD  Nutritional Supplements (FEEDING SUPPLEMENT, VITAL 1.5 CAL,) LIQD Place 1,100 mLs into feeding tube daily. Patient taking differently: Place 35 mLs into feeding tube every hour. 35 ml per hour for 23 hours, 3.5 cans per day 03/16/16  Yes Verlee Monte, MD  ondansetron (ZOFRAN-ODT) 8 MG disintegrating tablet Take 1 tablet (8 mg total) by mouth every 8 (eight) hours as needed for nausea. 05/31/16  Yes Stark Klein, MD  oxyCODONE (ROXICODONE) 5 MG/5ML solution Place 5 mg into feeding tube every 4 (four) hours as needed for severe pain.   Yes Historical Provider, MD  pantoprazole (PROTONIX) 40 MG tablet TAKE 1 TABLET (40 MG TOTAL) BY MOUTH 2 (TWO) TIMES DAILY. 12/11/15  Yes [name], MD  potassium chloride 20 MEQ/15ML (10%) SOLN Take 15 mLs (20 mEq total) by mouth daily. j tube 05/31/16  Yes Stark Klein, MD  ranitidine (ZANTAC) 150 MG tablet Take 150 mg by mouth 2 (two) times daily.   Yes Historical Provider, MD  traMADol (ULTRAM) 50 MG tablet Take 1 tablet (50 mg total) by mouth every 6 (six) hours as needed (pain). Reported on 10/14/2015 05/31/16  Yes Stark Klein, MD  warfarin (COUMADIN) 2 MG tablet TAKE 1 TABLET DAILY OR AS DIRECTED Patient taking differently: Take 2 mg by mouth nightly (Sun/Mon/Tues/Wed/Thurs/Fri/Sat) every day 01/26/16  Yes [name], MD  Vital Signs: BP (!) 157/65 (BP Location: Left Arm)   Pulse 71   Temp 98 F (36.7 C) (Oral)   Resp 18   Ht '5\' 1"'$  (1.549 m)   Wt 148 lb 9.4 oz (67.4 kg)   LMP  (Exact Date)   SpO2 99%   BMI 28.08 kg/m   Physical Exam  Constitutional: She is oriented to person, place, and time.  Abdominal: Soft. Bowel sounds are normal. There is no tenderness.  Musculoskeletal: Normal range of motion.  Neurological: She is alert and oriented to person,  place, and time.  Skin: Skin is warm and dry.  Sites of drain s are clean and dry NT no bleeding No sign of infection   output Rt TG drain is thin blood Cx+ enterococcus and klebsiella 5 cc yesterday Scant in JP   output L abd drain is milky yellow abun wbcs 8 cc yesterday; 5 cc in [place] note and vitals reviewed.   Imaging: Ct Image Guided Drainage By Percutaneous Catheter  Result Date: 06/05/2016 INDICATION: History of gastric cancer, post proximal gastrectomy performed 01/2016. Gastrojejunostomy catheter placed on 05/18/2016 however patient developed postprocedural abdominal pain and distention. CT scan of the abdomen pelvis performed 06/03/2016 demonstrated development of an indeterminate fluid collection with the anterior aspect of the upper abdomen as well as the pelvic cul-de-sac. As such, request made for CT-guided placement of percutaneous drainage catheter(s) and/or aspiration. EXAM: 1. CT-GUIDED ABDOMINAL PERCUTANEOUS DRAINAGE CATHETER PLACEMENT. 2. CT-GUIDED TRANS GLUTEAL APPROACH PELVIC PERCUTANEOUS DRAINAGE CATHETER PLACEMENT. COMPARISON:  CT abdomen pelvis - 06/05/2016; 06/03/2016 MEDICATIONS: The patient is currently admitted to the hospital and receiving intravenous antibiotics. The antibiotics were administered within an appropriate time frame prior to the initiation of the procedure. ANESTHESIA/SEDATION: Moderate (conscious) sedation was employed during this procedure. A total of Versed 2 mg and Fentanyl 100 mcg was administered intravenously. Moderate Sedation Time: 16 minutes. The patient's level of consciousness and vital signs were monitored continuously by radiology nursing throughout the procedure under my direct supervision. CONTRAST:  None COMPLICATIONS: None immediate. PROCEDURE: Informed written consent was obtained from the patient after a discussion of the risks, benefits and alternatives to treatment. The patient was initially placed supine on the CT gantry  and a pre procedural CT was performed re-demonstrating the known abscess/fluid collection within the upper abdomen with dominant component measuring approximately 5.0 x 8.8 cm (image 47, series 3). The procedure was planned. A timeout was performed prior to the initiation of the procedure. The skin overlying the left lateral mid abdomen was prepped and draped in the usual sterile fashion. The overlying soft tissues were anesthetized with 1% lidocaine with epinephrine. Appropriate trajectory was planned with the use of a 22 gauge spinal needle. An 18 gauge trocar needle was advanced into the abscess/fluid collection and a short Amplatz super stiff wire was coiled within the collection. Appropriate positioning was confirmed with a limited CT scan. The tract was serially dilated allowing placement of a 10 [place] all-purpose drainage catheter. Appropriate positioning was confirmed with a limited postprocedural CT scan. Approximately 170 Ml of purulent fluid was aspirated. The tube was connected to a JP bulb and sutured in place. A dressing was placed. Postprocedural imaging demonstrated a near complete evacuation of the dominant fluid collection within the upper abdomen however note was made of a residual approximately 3.7 x 4.6 cm fluid collection within the lower pelvis (image 45, series 3) compatible the findings seen on preceding abdominal CT performed 06/03/2016. Given  the purulent nature of the aspirated fluid from the upper abdomen, the decision was made to attempt right trans gluteal approach drainage catheter placement into this additional collection within the lower pelvis. As such, the patient was positioned left lateral decubitus. The skin overlying the right buttocks was prepped and draped in usual sterile fashion. The overlying soft tissues were anesthetized with 1% lidocaine with epinephrine. Appropriate trajectory was planned with the use of a 22 gauge spinal needle. An 18 gauge trocar needle was  advanced into the abscess/fluid collection and a short Amplatz super stiff wire was coiled within the collection. Appropriate positioning was confirmed with a limited CT scan. The tract was serially dilated allowing placement of a 10 [place] all-purpose drainage catheter. Appropriate positioning was confirmed with a limited postprocedural CT scan. Approximately 10 cc of blood tinged non foul smelling fluid was aspirated. The tube was connected to a JP bulb and sutured in place. A dressing was placed. The patient tolerated the above procedures well without immediate postprocedural complication. IMPRESSION: 1. Successful CT guided placement of a 10 French all purpose drain catheter into the left mid hemi abdomen with aspiration of approximately 170 cc of purulent fluid. 2. Successful CT-guided placement of a 10 French all-purpose drainage catheter into the lower pelvis via a right trans gluteal approach with aspiration of approximately 10 cc of blood tinged, non foul smelling fluid. 3. Samples were sent to the laboratory separately from both percutaneous drainage catheters as requested by the ordering clinical team. Electronically Signed   By: Simonne Come M.D.   On: 06/05/2016 17:16   Ct Image Guided Drainage By Percutaneous Catheter  Result Date: 06/05/2016 INDICATION: History of gastric cancer, post proximal gastrectomy performed 01/2016. Gastrojejunostomy catheter placed on 05/18/2016 however patient developed postprocedural abdominal pain and distention. CT scan of the abdomen pelvis performed 06/03/2016 demonstrated development of an indeterminate fluid collection with the anterior aspect of the upper abdomen as well as the pelvic cul-de-sac. As such, request made for CT-guided placement of percutaneous drainage catheter(s) and/or aspiration. EXAM: 1. CT-GUIDED ABDOMINAL PERCUTANEOUS DRAINAGE CATHETER PLACEMENT. 2. CT-GUIDED TRANS GLUTEAL APPROACH PELVIC PERCUTANEOUS DRAINAGE CATHETER PLACEMENT. COMPARISON:   CT abdomen pelvis - 06/05/2016; 06/03/2016 MEDICATIONS: The patient is currently admitted to the hospital and receiving intravenous antibiotics. The antibiotics were administered within an appropriate time frame prior to the initiation of the procedure. ANESTHESIA/SEDATION: Moderate (conscious) sedation was employed during this procedure. A total of Versed 2 mg and Fentanyl 100 mcg was administered intravenously. Moderate Sedation Time: 16 minutes. The patient's level of consciousness and vital signs were monitored continuously by radiology nursing throughout the procedure under my direct supervision. CONTRAST:  None COMPLICATIONS: None immediate. PROCEDURE: Informed written consent was obtained from the patient after a discussion of the risks, benefits and alternatives to treatment. The patient was initially placed supine on the CT gantry and a pre procedural CT was performed re-demonstrating the known abscess/fluid collection within the upper abdomen with dominant component measuring approximately 5.0 x 8.8 cm (image 47, series 3). The procedure was planned. A timeout was performed prior to the initiation of the procedure. The skin overlying the left lateral mid abdomen was prepped and draped in the usual sterile fashion. The overlying soft tissues were anesthetized with 1% lidocaine with epinephrine. Appropriate trajectory was planned with the use of a 22 gauge spinal needle. An 18 gauge trocar needle was advanced into the abscess/fluid collection and a short Amplatz super stiff wire was coiled within the collection. Appropriate positioning  was confirmed with a limited CT scan. The tract was serially dilated allowing placement of a 10 [place] all-purpose drainage catheter. Appropriate positioning was confirmed with a limited postprocedural CT scan. Approximately 170 Ml of purulent fluid was aspirated. The tube was connected to a JP bulb and sutured in place. A dressing was placed. Postprocedural imaging  demonstrated a near complete evacuation of the dominant fluid collection within the upper abdomen however note was made of a residual approximately 3.7 x 4.6 cm fluid collection within the lower pelvis (image 45, series 3) compatible the findings seen on preceding abdominal CT performed 06/03/2016. Given the purulent nature of the aspirated fluid from the upper abdomen, the decision was made to attempt right trans gluteal approach drainage catheter placement into this additional collection within the lower pelvis. As such, the patient was positioned left lateral decubitus. The skin overlying the right buttocks was prepped and draped in usual sterile fashion. The overlying soft tissues were anesthetized with 1% lidocaine with epinephrine. Appropriate trajectory was planned with the use of a 22 gauge spinal needle. An 18 gauge trocar needle was advanced into the abscess/fluid collection and a short Amplatz super stiff wire was coiled within the collection. Appropriate positioning was confirmed with a limited CT scan. The tract was serially dilated allowing placement of a 10 [place] all-purpose drainage catheter. Appropriate positioning was confirmed with a limited postprocedural CT scan. Approximately 10 cc of blood tinged non foul smelling fluid was aspirated. The tube was connected to a JP bulb and sutured in place. A dressing was placed. The patient tolerated the above procedures well without immediate postprocedural complication. IMPRESSION: 1. Successful CT guided placement of a 10 French all purpose drain catheter into the left mid hemi abdomen with aspiration of approximately 170 cc of purulent fluid. 2. Successful CT-guided placement of a 10 French all-purpose drainage catheter into the lower pelvis via a right trans gluteal approach with aspiration of approximately 10 cc of blood tinged, non foul smelling fluid. 3. Samples were sent to the laboratory separately from both percutaneous drainage catheters as  requested by the ordering clinical team. Electronically Signed   By: Sandi Mariscal M.D.   On: 06/05/2016 17:16    Labs:  CBC:  Recent Labs  06/04/16 0240 06/05/16 0514 06/06/16 0218 06/07/16 0323  WBC 8.0 7.3 7.1 7.6  HGB 9.2* 9.7* 9.7* 10.5*  HCT 29.1* 31.1* 30.8* 32.8*  PLT 380 443* 428* 441*    COAGS:  Recent Labs  11/07/15 1740  06/06/16 0218 06/07/16 0323 06/08/16 0242 06/09/16 0308  INR 2.31*  < > 1.28 1.25 1.27 1.31  APTT 44*  --   --   --   --   --   < > = values in this interval not displayed.  BMP:  Recent Labs  06/04/16 0240 06/05/16 0514 06/06/16 0218 06/07/16 0323  NA 131* 132* 136 136  K 4.1 4.1 3.6 3.5  CL 100* 100* 103 106  CO2 25 21* 25 22  GLUCOSE 103* 112* 106* 94  BUN 7 6 5* <5*  CALCIUM 8.2* 8.5* 8.4* 8.6*  CREATININE 0.63 0.76 0.86 0.81  GFRNONAA >60 >60 >60 >60  GFRAA >60 >60 >60 >60    LIVER FUNCTION TESTS:  Recent Labs  05/27/16 0220 05/28/16 0430 05/29/16 0148 06/03/16 1539  BILITOT 0.4 0.5 0.4 0.9  AST '18 17 15 '$ 34  ALT 17 16 13* 19  ALKPHOS 83 78 73 90  PROT 5.4* 5.4* 5.3* 5.7*  ALBUMIN  2.1* 2.2* 2.1* 2.3*    Assessment and Plan:  Improving daily Drains intact For CT tomorrow per Dr Barry Dienes  Electronically Signed: Monia Sabal A 06/09/2016, 8:59 AM   I spent a total of 15 Minutes at the the patient's bedside AND on the patient's hospital floor or unit, greater than 50% of which was counseling/coordinating care for abscess drains

## 2016-06-10 ENCOUNTER — Inpatient Hospital Stay (HOSPITAL_COMMUNITY): Payer: Medicare Other

## 2016-06-10 ENCOUNTER — Encounter (HOSPITAL_COMMUNITY): Payer: Self-pay | Admitting: Radiology

## 2016-06-10 LAB — AEROBIC/ANAEROBIC CULTURE W GRAM STAIN (SURGICAL/DEEP WOUND): Culture: NO GROWTH

## 2016-06-10 LAB — AEROBIC/ANAEROBIC CULTURE (SURGICAL/DEEP WOUND)

## 2016-06-10 LAB — PROTIME-INR
INR: 1.17
Prothrombin Time: 14.9 seconds (ref 11.4–15.2)

## 2016-06-10 MED ORDER — METHOCARBAMOL 500 MG PO TABS
500.0000 mg | ORAL_TABLET | Freq: Three times a day (TID) | ORAL | Status: DC | PRN
  Administered 2016-06-10 – 2016-06-11 (×2): 500 mg via ORAL
  Filled 2016-06-10 (×2): qty 1

## 2016-06-10 MED ORDER — HYDRALAZINE HCL 20 MG/ML IJ SOLN
20.0000 mg | INTRAMUSCULAR | Status: DC | PRN
  Administered 2016-06-10 – 2016-06-11 (×2): 20 mg via INTRAVENOUS
  Filled 2016-06-10 (×2): qty 1

## 2016-06-10 MED ORDER — WARFARIN SODIUM 4 MG PO TABS
4.0000 mg | ORAL_TABLET | Freq: Once | ORAL | Status: AC
  Administered 2016-06-10: 4 mg via ORAL
  Filled 2016-06-10: qty 1

## 2016-06-10 MED ORDER — IOPAMIDOL (ISOVUE-300) INJECTION 61%
15.0000 mL | INTRAVENOUS | Status: AC
  Administered 2016-06-10 (×2): 15 mL via ORAL

## 2016-06-10 MED ORDER — IOPAMIDOL (ISOVUE-300) INJECTION 61%
INTRAVENOUS | Status: AC
  Administered 2016-06-10: 100 mL
  Filled 2016-06-10: qty 100

## 2016-06-10 NOTE — Progress Notes (Signed)
  Subjective: Doing well.    Objective: Vital signs in last 24 hours: Temp:  [97.9 F (36.6 C)-98.4 F (36.9 C)] 97.9 F (36.6 C) (10/05 0510) Pulse Rate:  [66-82] 66 (10/05 0510) Resp:  [18-20] 20 (10/05 0510) BP: (143-194)/(62-70) 172/70 (10/05 0523) SpO2:  [97 %-100 %] 97 % (10/05 0510) Last BM Date: 06/09/16  Intake/Output from previous day: 10/04 0701 - 10/05 0700 In: 1357 [P.O.:840] Out: 25 [Drains:25] Intake/Output this shift: No intake/output data recorded.  General appearance: alert, cooperative and mild distress Resp: breathing comfortably GI: soft, distended.  tender near J tube, but minimal erythema.   Milky LUQ drain.  Serosang pelvic drain (right transgluteal).  Lab Results:  No results for input(s): WBC, HGB, HCT, PLT in the last 72 hours. BMET No results for input(s): NA, K, CL, CO2, GLUCOSE, BUN, CREATININE, CALCIUM in the last 72 hours. PT/INR  Recent Labs  06/09/16 0308 06/10/16 0325  LABPROT 16.4* 14.9  INR 1.31 1.17   ABG No results for input(s): PHART, HCO3 in the last 72 hours.  Invalid input(s): PCO2, PO2  Studies/Results: No results found.  Anti-infectives: Anti-infectives    Start     Dose/Rate Route Frequency Ordered Stop   06/04/16 1500  piperacillin-tazobactam (ZOSYN) IVPB 3.375 g     3.375 g 12.5 mL/hr over 240 Minutes Intravenous Every 8 hours 06/04/16 1413        Assessment/Plan: s/p * No surgery found *  Gastric cancer J tube in place N/V Anticoagulation A fib Abdominal fluid collections Chronic anemia Jehovah's witness   Full liquids and tube feeds.  Should be at goal feeds tomorrow.  May be able to start cycling.   Zosyn. Enterococcus and klebsiella isolated from transgluteal drain.   Abscess.   Repeat CT scan today Coumadin for atrial fibrillation.   May be able to get right transgluteal drain out prior to d/c.     LOS: 7 days    Golda Zavalza 06/10/2016

## 2016-06-10 NOTE — Consult Note (Signed)
   [facility] CM Inpatient Consult   06/10/2016  ITZAYANI GUETTER 1933-09-12 [id number]   Patient evaluated for community based chronic disease management services with [place] [facility] as a benefit of patient's [facility]. Patient has had 5 admissions in the past 6 months.  Chart review reveals patient according to her HX  'is an 82 yo F dx gastric cancer Jan 2017 when she was admitted for pneumonia and anemia.  Is also anticoagulated and a jehovah's witness.  Has history of neuro event when off anticoagulation for prolonged period of time.  Had long discussion of risks of surgery.  Did proximal gastrectomy with sleeve type reconstruction of remnant distal stomach May 2017.  Unfortunately she developed unremitting retching.  Then placed J tube in June 2017 to get her off TNA.  Still had retching and esophagitis with pain that was refractory to treatment.  Converted to Roux en y gastrojejunostomy and disconnected duodenum 05/18/16.  Retching stopped immediately.  Was doing well and went home Monday.  She did develop a small amount of irritation around the J tube site with some leakage that was treated with zn oxide paste. She is still passing gas and having her normal loose BMs that she has had for years.  Spoke with patient and her daughter [name], at bedside to explain [place] Management services for community follow up and resource.  Patient has been active with [facility] in the past.   Consent form signed.  Patient will receive post hospital discharge call and will be evaluated for monthly home visits for assessments and disease process education.  Left contact information and [facility] literature at bedside. Made Inpatient Case Manager aware that [place] Management following. Of note, [facility] Care Management services does not replace or interfere with any services that are arranged by inpatient case management or social work.  For additional questions or referrals please  contact:   Natividad Brood, RN BSN [facility] Liaison  [phone] business mobile phone Toll free office [phone]

## 2016-06-10 NOTE — Progress Notes (Addendum)
ANTICOAGULATION CONSULT NOTE - Follow Up Consult  Pharmacy Consult:  Coumadin Indication: atrial fibrillation   Assessment: 82yof continues on coumadin for hx afib and CVA.  Coumadin was on hold since 9/28 and vitamin k was given on 9/29 after CT noted abdominal abscess requiring drains.   INR relatively unchanged overnight at 1.17. Conservative dosing requested by surgery. Will repeat 4 mg today   Home Coumadin regimen:  2 mg daily  Goal of Therapy:  INR 2-3  Monitor platelets by anticoagulation protocol: yes  Plan:  -Coumadin 4mg  tonight -Daily INR -Monitor for s/sx bleeding    Allergies  Allergen Reactions  . Adhesive [Tape] Other (See Comments)    Tears skin, Please use "paper" tape  . Amiodarone Other (See Comments)     Thyroid and liver and kidney problems  . Codeine Nausea And Vomiting  . Hydrocodone Other (See Comments)    hallucination  . Morphine And Related Nausea And Vomiting  . Requip [Ropinirole Hcl] Other (See Comments)    Headache   . Zinc Nausea Only    nausea  . Reglan [Metoclopramide] Hives, Itching and Rash    Made face red, also   Patient Measurements: Height: 5\' 1"  (154.9 cm) Weight: 148 lb 9.4 oz (67.4 kg) IBW/kg (Calculated) : 47.8  Vital Signs: Temp: 97.9 F (36.6 C) (10/05 0510) Temp Source: Oral (10/05 0510) BP: 172/70 (10/05 0523) Pulse Rate: 66 (10/05 0510)  Labs:  Recent Labs  06/08/16 0242 06/09/16 0308 06/10/16 0325  LABPROT 16.0* 16.4* 14.9  INR 1.27 1.31 1.17   Estimated Creatinine Clearance: 48.6 mL/min (by C-G formula based on SCr of 0.81 mg/dL).  06/10/2016 9:58 AM

## 2016-06-10 NOTE — Progress Notes (Signed)
At room to place PIV.  Pt on BSC.  Tiffany RN to place consult when pt in bed and ready .

## 2016-06-11 LAB — PROTIME-INR
INR: 1.63
Prothrombin Time: 19.5 seconds — ABNORMAL HIGH (ref 11.4–15.2)

## 2016-06-11 MED ORDER — NYSTATIN 100000 UNIT/GM EX CREA
1.0000 "application " | TOPICAL_CREAM | Freq: Two times a day (BID) | CUTANEOUS | Status: DC
  Administered 2016-06-11 – 2016-06-13 (×5): 1 via TOPICAL
  Filled 2016-06-11: qty 15

## 2016-06-11 MED ORDER — WARFARIN SODIUM 4 MG PO TABS
4.0000 mg | ORAL_TABLET | Freq: Once | ORAL | Status: AC
  Administered 2016-06-11: 4 mg via ORAL
  Filled 2016-06-11: qty 1

## 2016-06-11 NOTE — Care Management Important Message (Signed)
Important Message  Patient Details  Name: [name] MRN: [id number] Date of Birth: 1934/05/08   Medicare Important Message Given:  Yes    [name] 06/11/2016, 1:06 PM

## 2016-06-11 NOTE — Progress Notes (Signed)
  Subjective: Continues to do well.  CT shows near resolution of abscess cavities.   Objective: Vital signs in last 24 hours: Temp:  [98 F (36.7 C)-98.8 F (37.1 C)] 98.4 F (36.9 C) (10/06 0517) Pulse Rate:  [72-88] 88 (10/06 0517) Resp:  [18-20] 20 (10/06 0517) BP: (142-201)/(47-70) 170/69 (10/06 0517) SpO2:  [98 %] 98 % (10/06 0517) Last BM Date: 06/10/16  Intake/Output from previous day: 10/05 0701 - 10/06 0700 In: 740 [P.O.:120] Out: 20 [Drains:20] Intake/Output this shift: No intake/output data recorded.  General appearance: alert, cooperative and no distress Resp: breathing comfortably GI: soft, non distended.  Mild candidiasis around J tube.   Milky LUQ drain.  Serosang pelvic drain (right transgluteal).  Lab Results:  No results for input(s): WBC, HGB, HCT, PLT in the last 72 hours. BMET No results for input(s): NA, K, CL, CO2, GLUCOSE, BUN, CREATININE, CALCIUM in the last 72 hours. PT/INR  Recent Labs  06/10/16 0325 06/11/16 0254  LABPROT 14.9 19.5*  INR 1.17 1.63   ABG No results for input(s): PHART, HCO3 in the last 72 hours.  Invalid input(s): PCO2, PO2  Studies/Results: Ct Abdomen Pelvis W Contrast  Result Date: 06/10/2016 CLINICAL DATA:  Abdominal abscess. EXAM: CT ABDOMEN AND PELVIS WITH CONTRAST TECHNIQUE: Multidetector CT imaging of the abdomen and pelvis was performed using the standard protocol following bolus administration of intravenous contrast. CONTRAST:  140mL ISOVUE-300 IOPAMIDOL (ISOVUE-300) INJECTION 61% COMPARISON:  CT scan of June 05, 2016 and June 03, 2016. FINDINGS: Lower chest: Mild left pleural effusion is noted with adjacent subsegmental atelectasis. Right lung is clear. Hepatobiliary: Status post cholecystectomy. The liver appears normal. Pancreas: Normal. Spleen: Normal. Adrenals/Urinary Tract: Adrenal glands appear normal. Status post left nephrectomy. Right kidney appears normal. Urinary bladder appears normal.  Stomach/Bowel: Status post partial gastrectomy. Jejunal feeding tube is noted. There is no evidence of bowel obstruction. Vascular/Lymphatic: Atherosclerosis of abdominal aorta is noted without aneurysm formation. No significant adenopathy is noted. Reproductive: Status post hysterectomy. Other: Drainage catheter is noted along anterior abdominal wall with complete decompression of abscess noted on prior exam. Another right trans gluteal drainage catheter is noted with only minimal fluid and gas remaining in that abscess. Musculoskeletal: Status post right hip arthroplasty. Multilevel degenerative disc disease is noted in the lower thoracic spine. IMPRESSION: Aortic atherosclerosis. Status post left nephrectomy and partial gastrectomy. Drainage catheter noted along anterior abdominal wall is again noted with complete decompression of abscess noted on prior exam. Right trans gluteal drainage catheter is also noted with only minimal fluid and gas remaining in that abscess. Electronically Signed   By: Marijo Conception, M.D.   On: 06/10/2016 15:14    Anti-infectives: Anti-infectives    Start     Dose/Rate Route Frequency Ordered Stop   06/04/16 1500  piperacillin-tazobactam (ZOSYN) IVPB 3.375 g     3.375 g 12.5 mL/hr over 240 Minutes Intravenous Every 8 hours 06/04/16 1413        Assessment/Plan: s/p * No surgery found *  Gastric cancer J tube in place N/V Anticoagulation A fib Abdominal fluid collections Chronic anemia Jehovah's witness   Full liquids and tube feeds.  Advance feeds to 42 ml/hr over 20 hrs.day  Zosyn. Enterococcus and klebsiella isolated from transgluteal drain.   Abscess.   Discuss drains with IR.   Coumadin for atrial fibrillation.      LOS: 8 days    [name] 06/11/2016

## 2016-06-11 NOTE — Progress Notes (Addendum)
Initial Nutrition Assessment  DOCUMENTATION CODES:   Non-severe (moderate) malnutrition in context of chronic illness  INTERVENTION:    Continue Vital 1.5 formula at 42 ml/hr via J-tube x 20 hours  TF regimen provides 1260 kcals, 57 gm protein, 642 ml of free water  NUTRITION DIAGNOSIS:   Inadequate oral intake related to altered GI function as evidenced by  (supplemental TF via J-tube)   GOAL:   Patient will meet greater than or equal to 90% of their needs  MONITOR:   TF tolerance, PO intake, Labs, Weight trends, I & O's  REASON FOR ASSESSMENT:   Other (Comment)  (Tube Feeding)  ASSESSMENT:   82 year old Female with PMH of gastric cancer, CAD, Afib, pacer, multiple RLE surgeries, CKD, HTN.  Freq hospitals stays for retching since 01/15/16 when she was d/c's have a partial gastrectomy. 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  Pt familiar to Clinical Nutrition during previous admissions. Seen most recently in September 2017 >> identified with moderate malnutrition which is ongoing. Pt currently on a Full Liquid diet >> PO intake 20% per flowsheets. Vital 1.5 formula currently infusing at 42 ml/hr via J-tube. Surgery note reviewed >> CT shows near resolution of abscess cavities.  Calorie count was conducted during most previous admission (9/19--9/21). Pt's average intake (PO's only) was 20% of estimated kcal needs, 7% of estimated protein needs.  Diet Order:  Diet full liquid Room service appropriate? Yes; Fluid consistency: Thin  Skin:  Reviewed, no issues  Last BM:  10/4  Height:   Ht Readings from Last 1 Encounters:  06/03/16 5\' 1"  (1.549 m)    Weight:   Wt Readings from Last 1 Encounters:  06/04/16 148 lb 9.4 oz (67.4 kg)    Ideal Body Weight:  47.7 kg  BMI:  Body mass index is 28.08 kg/m.  Estimated Nutritional Needs:   Kcal:   1800-2000  Protein:  95-105 gm  Fluid:  1.8-2.0 L  EDUCATION NEEDS:   No education needs identified at this time  Arthur Holms, RD, LDN Pager #: [phone] After-Hours Pager #: [phone]

## 2016-06-11 NOTE — Progress Notes (Signed)
Referring Physician(s): Dr Mamie Laurel  Supervising Physician: Aletta Edouard  Patient Status:  Inpatient  Chief Complaint:  Left abd abscess and pelvic abscess  Subjective:  Pt better daily Tolerating feeds CT 10/5: Drainage catheter noted along anterior abdominal wall is again noted with complete decompression of abscess noted on prior exam. Right trans gluteal drainage catheter is also noted with only minimal fluid and gas remaining in that abscess.   Allergies: Adhesive [tape]; Amiodarone; Codeine; Hydrocodone; Morphine and related; Requip [ropinirole hcl]; Zinc; and Reglan [metoclopramide]  Medications: Prior to Admission medications   Medication Sig Start Date End Date Taking? Authorizing Provider  Acetaminophen (TYLENOL) 167 MG/5ML LIQD Take 10 mLs by mouth daily as needed (for pain).   Yes Historical Provider, MD  ALPRAZolam (XANAX) 0.5 MG tablet TAKE 1/2-1 TABLET BY MOUTH 3 TIMES A DAY AS NEEDED FOR ANXIETY Patient taking differently: TAKE 1/2-1 TABLET (0.25 - 0.5 mg) BY MOUTH 3 TIMES A DAY AS NEEDED FOR ANXIETY 01/01/16  Yes Courtney Forcucci, PA-C  Cyanocobalamin 1000 MCG/ML KIT Inject 1,000 mcg into the skin as directed. Patient taking differently: Inject 1,000 mcg into the skin every 30 (thirty) days.  11/24/15  Yes Brunetta Genera, MD  diltiazem (CARDIZEM CD) 180 MG 24 hr capsule Take 1 capsule (180 mg total) by mouth daily. 05/05/16  Yes [name], MD  furosemide (LASIX) 40 MG tablet TAKE 1 TABLET (40 MG TOTAL) BY MOUTH DAILY. Patient taking differently: TAKE 1/2 TABLET (20 MG TOTAL) BY MOUTH DAILY. 02/07/16  Yes [name], MD  gabapentin (NEURONTIN) 250 MG/5ML solution Place 100 mg into feeding tube 3 (three) times daily.  04/12/16  Yes Historical Provider, MD  levothyroxine (SYNTHROID, LEVOTHROID) 100 MCG tablet TAKE 1/2 TABLET (50 MCG TOTAL) BY MOUTH EVERY DAY ON EMPTY STOMACH WITH A FULL GLASS OF WATER 02/13/16  Yes Mauricio Gerome Apley, MD    metoprolol (LOPRESSOR) 100 MG tablet Take 1 tablet (100 mg total) by mouth 2 (two) times daily. 05/05/16  Yes [name], MD  nitroGLYCERIN (NITROSTAT) 0.4 MG SL tablet Place 1 tablet (0.4 mg total) under the tongue every 5 (five) minutes as needed for chest pain (MAX 3 TABLETS). 05/19/15  Yes Darlin Coco, MD  Nutritional Supplements (FEEDING SUPPLEMENT, VITAL 1.5 CAL,) LIQD Place 1,100 mLs into feeding tube daily. Patient taking differently: Place 35 mLs into feeding tube every hour. 35 ml per hour for 23 hours, 3.5 cans per day 03/16/16  Yes Verlee Monte, MD  ondansetron (ZOFRAN-ODT) 8 MG disintegrating tablet Take 1 tablet (8 mg total) by mouth every 8 (eight) hours as needed for nausea. 05/31/16  Yes Stark Klein, MD  oxyCODONE (ROXICODONE) 5 MG/5ML solution Place 5 mg into feeding tube every 4 (four) hours as needed for severe pain.   Yes Historical Provider, MD  pantoprazole (PROTONIX) 40 MG tablet TAKE 1 TABLET (40 MG TOTAL) BY MOUTH 2 (TWO) TIMES DAILY. 12/11/15  Yes [name], MD  potassium chloride 20 MEQ/15ML (10%) SOLN Take 15 mLs (20 mEq total) by mouth daily. j tube 05/31/16  Yes Stark Klein, MD  ranitidine (ZANTAC) 150 MG tablet Take 150 mg by mouth 2 (two) times daily.   Yes Historical Provider, MD  traMADol (ULTRAM) 50 MG tablet Take 1 tablet (50 mg total) by mouth every 6 (six) hours as needed (pain). Reported on 10/14/2015 05/31/16  Yes Stark Klein, MD  warfarin (COUMADIN) 2 MG tablet TAKE 1 TABLET DAILY OR AS DIRECTED Patient taking differently: Take 2  mg by mouth nightly (Sun/Mon/Tues/Wed/Thurs/Fri/Sat) every day 01/26/16  Yes [name], MD     Vital Signs: BP (!) 170/69 (BP Location: Right Arm)   Pulse 88   Temp 98.4 F (36.9 C) (Oral)   Resp 20   Ht '5\' 1"'$  (1.549 m)   Wt 148 lb 9.4 oz (67.4 kg)   LMP  (Exact Date)   SpO2 98%   BMI 28.08 kg/m   Physical Exam  Constitutional: She is oriented to person, place, and time.  Abdominal: Soft. Bowel sounds  are normal. There is no tenderness.  Neurological: She is alert and oriented to person, place, and time.  Skin: Skin is warm and dry.  Sites are clean and dry NT\no bleeding Output for both decreasing nicely CT shows collections resolving with almost complete resolution  Output LLQ drain: milky yellow Output Rt TG drain serosanguinous TG drain + enterococcus and klebsiella  Flushing well     Imaging: Ct Abdomen Pelvis W Contrast  Result Date: 06/10/2016 CLINICAL DATA:  Abdominal abscess. EXAM: CT ABDOMEN AND PELVIS WITH CONTRAST TECHNIQUE: Multidetector CT imaging of the abdomen and pelvis was performed using the standard protocol following bolus administration of intravenous contrast. CONTRAST:  141m ISOVUE-300 IOPAMIDOL (ISOVUE-300) INJECTION 61% COMPARISON:  CT scan of June 05, 2016 and June 03, 2016. FINDINGS: Lower chest: Mild left pleural effusion is noted with adjacent subsegmental atelectasis. Right lung is clear. Hepatobiliary: Status post cholecystectomy. The liver appears normal. Pancreas: Normal. Spleen: Normal. Adrenals/Urinary Tract: Adrenal glands appear normal. Status post left nephrectomy. Right kidney appears normal. Urinary bladder appears normal. Stomach/Bowel: Status post partial gastrectomy. Jejunal feeding tube is noted. There is no evidence of bowel obstruction. Vascular/Lymphatic: Atherosclerosis of abdominal aorta is noted without aneurysm formation. No significant adenopathy is noted. Reproductive: Status post hysterectomy. Other: Drainage catheter is noted along anterior abdominal wall with complete decompression of abscess noted on prior exam. Another right trans gluteal drainage catheter is noted with only minimal fluid and gas remaining in that abscess. Musculoskeletal: Status post right hip arthroplasty. Multilevel degenerative disc disease is noted in the lower thoracic spine. IMPRESSION: Aortic atherosclerosis. Status post left nephrectomy and partial  gastrectomy. Drainage catheter noted along anterior abdominal wall is again noted with complete decompression of abscess noted on prior exam. Right trans gluteal drainage catheter is also noted with only minimal fluid and gas remaining in that abscess. Electronically Signed   By: JMarijo Conception M.D.   On: 06/10/2016 15:14    Labs:  CBC:  Recent Labs  06/04/16 0240 06/05/16 0514 06/06/16 0218 06/07/16 0323  WBC 8.0 7.3 7.1 7.6  HGB 9.2* 9.7* 9.7* 10.5*  HCT 29.1* 31.1* 30.8* 32.8*  PLT 380 443* 428* 441*    COAGS:  Recent Labs  11/07/15 1740  06/08/16 0242 06/09/16 0308 06/10/16 0325 06/11/16 0254  INR 2.31*  < > 1.27 1.31 1.17 1.63  APTT 44*  --   --   --   --   --   < > = values in this interval not displayed.  BMP:  Recent Labs  06/04/16 0240 06/05/16 0514 06/06/16 0218 06/07/16 0323  NA 131* 132* 136 136  K 4.1 4.1 3.6 3.5  CL 100* 100* 103 106  CO2 25 21* 25 22  GLUCOSE 103* 112* 106* 94  BUN 7 6 5* <5*  CALCIUM 8.2* 8.5* 8.4* 8.6*  CREATININE 0.63 0.76 0.86 0.81  GFRNONAA >60 >60 >60 >60  GFRAA >60 >60 >60 >60  LIVER FUNCTION TESTS:  Recent Labs  05/27/16 0220 05/28/16 0430 05/29/16 0148 06/03/16 1539  BILITOT 0.4 0.5 0.4 0.9  AST '18 17 15 '$ 34  ALT 17 16 13* 19  ALKPHOS 83 78 73 90  PROT 5.4* 5.4* 5.3* 5.7*  ALBUMIN 2.1* 2.2* 2.1* 2.3*    Assessment and Plan:  Abdominal/pelvis abscess resolving Output minimal Discussed with Dr Arvil Chaco keeping both in for now Conservative management Follow up with IR drain clinic Order in place---pt will hear from clinic for time and date of appt. Be sure to flush daily at home - both drains   Electronically Signed: Naven Giambalvo A 06/11/2016, 8:22 AM   I spent a total of 15 Minutes at the the patient's bedside AND on the patient's hospital floor or unit, greater than 50% of which was counseling/coordinating care for abscesses

## 2016-06-11 NOTE — Progress Notes (Addendum)
ANTICOAGULATION CONSULT NOTE - Follow Up Consult  Pharmacy Consult:  Coumadin Indication: atrial fibrillation   Assessment: 82yof continues on coumadin for hx afib and CVA.  Coumadin was on hold since 9/28 and vitamin k was given on 9/29 after CT noted abdominal abscess requiring drains.   INR = 1.63 today  Home Coumadin regimen:  2 mg daily  Continues on Zosyn -- Day # 8 -> consider dc or change to po   Goal of Therapy:  INR 2-3  Monitor platelets by anticoagulation protocol: yes  Plan:  -Repeat Coumadin 4mg  tonight -Daily INR -Monitor for s/sx bleeding    Allergies  Allergen Reactions  . Adhesive [Tape] Other (See Comments)    Tears skin, Please use "paper" tape  . Amiodarone Other (See Comments)     Thyroid and liver and kidney problems  . Codeine Nausea And Vomiting  . Hydrocodone Other (See Comments)    hallucination  . Morphine And Related Nausea And Vomiting  . Requip [Ropinirole Hcl] Other (See Comments)    Headache   . Zinc Nausea Only    nausea  . Reglan [Metoclopramide] Hives, Itching and Rash    Made face red, also   Patient Measurements: Height: 5\' 1"  (154.9 cm) Weight: 148 lb 9.4 oz (67.4 kg) IBW/kg (Calculated) : 47.8  Vital Signs: Temp: 98.4 F (36.9 C) (10/06 0517) Temp Source: Oral (10/06 0517) BP: 170/69 (10/06 0517) Pulse Rate: 88 (10/06 0517)  Labs:  Recent Labs  06/09/16 0308 06/10/16 0325 06/11/16 0254  LABPROT 16.4* 14.9 19.5*  INR 1.31 1.17 1.63   Estimated Creatinine Clearance: 48.6 mL/min (by C-G formula based on SCr of 0.81 mg/dL).  Thank you  Anette Guarneri, PharmD [phone] 06/11/2016 10:39 AM

## 2016-06-12 LAB — PROTIME-INR
INR: 1.82
PROTHROMBIN TIME: 21.3 s — AB (ref 11.4–15.2)

## 2016-06-12 MED ORDER — WARFARIN SODIUM 2 MG PO TABS: 2.0000 mg | ORAL_TABLET | Freq: Once | ORAL | Status: DC

## 2016-06-12 MED ORDER — WARFARIN SODIUM 4 MG PO TABS
4.0000 mg | ORAL_TABLET | Freq: Once | ORAL | Status: AC
  Administered 2016-06-12: 4 mg via ORAL
  Filled 2016-06-12: qty 1

## 2016-06-12 NOTE — Progress Notes (Addendum)
ANTICOAGULATION CONSULT NOTE - Follow Up Consult  Pharmacy Consult:  Coumadin Indication: atrial fibrillation   Assessment: 82 yo F continues on coumadin for hx afib and CVA.  Coumadin was held 9/28 >> 10/2 for perc drain placement.  Pt received vitamin k on 9/29 after CT noted abdominal abscess requiring drains.  Since 10/2, INR has steadily trended up.  Nearing goal today.  INR = 1.82 today  Home Coumadin regimen:  2 mg daily   Goal of Therapy:  INR 2-3  Monitor platelets by anticoagulation protocol: yes  Plan:  -Coumadin 4mg  PO x 1 tonight.  Can likely restart home regimen tomorrow. -Daily INR -Monitor for s/sx bleeding    Allergies  Allergen Reactions  . Adhesive [Tape] Other (See Comments)    Tears skin, Please use "paper" tape  . Amiodarone Other (See Comments)     Thyroid and liver and kidney problems  . Codeine Nausea And Vomiting  . Hydrocodone Other (See Comments)    hallucination  . Morphine And Related Nausea And Vomiting  . Requip [Ropinirole Hcl] Other (See Comments)    Headache   . Zinc Nausea Only    nausea  . Reglan [Metoclopramide] Hives, Itching and Rash    Made face red, also   Patient Measurements: Height: 5\' 1"  (154.9 cm) Weight: 148 lb 9.4 oz (67.4 kg) IBW/kg (Calculated) : 47.8  Vital Signs: Temp: 97.6 F (36.4 C) (10/07 0616) Temp Source: Oral (10/07 0616) BP: 165/58 (10/07 0616) Pulse Rate: 77 (10/07 0616)  Labs:  Recent Labs  06/10/16 0325 06/11/16 0254 06/12/16 0224  LABPROT 14.9 19.5* 21.3*  INR 1.17 1.63 1.82   Estimated Creatinine Clearance: 48.6 mL/min (by C-G formula based on SCr of 0.81 mg/dL).  [employer], Pharm.D., BCPS Clinical Pharmacist Pager [phone] 06/12/2016 12:41 PM

## 2016-06-12 NOTE — Progress Notes (Signed)
  Subjective: Sitting on toilet, having bms, tol liquids, no n/v, no pain, wants to go home  Objective: Vital signs in last 24 hours: Temp:  [97.6 F (36.4 C)-100.3 F (37.9 C)] 97.6 F (36.4 C) (10/07 0616) Pulse Rate:  [73-100] 77 (10/07 0616) Resp:  [18-20] 18 (10/07 0616) BP: (102-165)/(50-70) 165/58 (10/07 0616) SpO2:  [89 %-99 %] 95 % (10/07 0616) Last BM Date: 06/11/16  Intake/Output from previous day: 10/06 0701 - 10/07 0700 In: 812.5 [P.O.:120] Out: -  Intake/Output this shift: No intake/output data recorded.    Lab Results:  No results for input(s): WBC, HGB, HCT, PLT in the last 72 hours. BMET No results for input(s): NA, K, CL, CO2, GLUCOSE, BUN, CREATININE, CALCIUM in the last 72 hours. PT/INR  Recent Labs  06/11/16 0254 06/12/16 0224  LABPROT 19.5* 21.3*  INR 1.63 1.82   ABG No results for input(s): PHART, HCO3 in the last 72 hours.  Invalid input(s): PCO2, PO2  Studies/Results: Ct Abdomen Pelvis W Contrast  Result Date: 06/10/2016 CLINICAL DATA:  Abdominal abscess. EXAM: CT ABDOMEN AND PELVIS WITH CONTRAST TECHNIQUE: Multidetector CT imaging of the abdomen and pelvis was performed using the standard protocol following bolus administration of intravenous contrast. CONTRAST:  173mL ISOVUE-300 IOPAMIDOL (ISOVUE-300) INJECTION 61% COMPARISON:  CT scan of June 05, 2016 and June 03, 2016. FINDINGS: Lower chest: Mild left pleural effusion is noted with adjacent subsegmental atelectasis. Right lung is clear. Hepatobiliary: Status post cholecystectomy. The liver appears normal. Pancreas: Normal. Spleen: Normal. Adrenals/Urinary Tract: Adrenal glands appear normal. Status post left nephrectomy. Right kidney appears normal. Urinary bladder appears normal. Stomach/Bowel: Status post partial gastrectomy. Jejunal feeding tube is noted. There is no evidence of bowel obstruction. Vascular/Lymphatic: Atherosclerosis of abdominal aorta is noted without aneurysm  formation. No significant adenopathy is noted. Reproductive: Status post hysterectomy. Other: Drainage catheter is noted along anterior abdominal wall with complete decompression of abscess noted on prior exam. Another right trans gluteal drainage catheter is noted with only minimal fluid and gas remaining in that abscess. Musculoskeletal: Status post right hip arthroplasty. Multilevel degenerative disc disease is noted in the lower thoracic spine. IMPRESSION: Aortic atherosclerosis. Status post left nephrectomy and partial gastrectomy. Drainage catheter noted along anterior abdominal wall is again noted with complete decompression of abscess noted on prior exam. Right trans gluteal drainage catheter is also noted with only minimal fluid and gas remaining in that abscess. Electronically Signed   By: Marijo Conception, M.D.   On: 06/10/2016 15:14    Anti-infectives: Anti-infectives    Start     Dose/Rate Route Frequency Ordered Stop   06/04/16 1500  piperacillin-tazobactam (ZOSYN) IVPB 3.375 g  Status:  Discontinued     3.375 g 12.5 mL/hr over 240 Minutes Intravenous Every 8 hours 06/04/16 1413 06/12/16 1003      Assessment/Plan: Gastric cancer J tube in place N/V Anticoagulation A fib Abdominal fluid collections Chronic anemia Jehovah's witness   Full liquids and tube feeds.  Advance feeds to 42 ml/hr over 20 hrs.day she should go home on this, would need to be setup prior to dc which could be tomorrow Zosyn. Enterococcus and klebsiella isolated from transgluteal drain., drains will remain per ir then return to drain clinic. Not sure she needs to be on zosyn at this time, dont see plan for duration, will stop today   Coumadin for atrial fibrillation.  not at goal yet   [facility] 06/12/2016

## 2016-06-13 DIAGNOSIS — Z23 Encounter for immunization: Secondary | ICD-10-CM | POA: Diagnosis not present

## 2016-06-13 LAB — PROTIME-INR
INR: 2.09
PROTHROMBIN TIME: 23.8 s — AB (ref 11.4–15.2)

## 2016-06-13 LAB — CBC
HCT: 33.7 % — ABNORMAL LOW (ref 36.0–46.0)
HEMOGLOBIN: 10.4 g/dL — AB (ref 12.0–15.0)
MCH: 29.7 pg (ref 26.0–34.0)
MCHC: 30.9 g/dL (ref 30.0–36.0)
MCV: 96.3 fL (ref 78.0–100.0)
Platelets: 356 10*3/uL (ref 150–400)
RBC: 3.5 MIL/uL — AB (ref 3.87–5.11)
RDW: 15.9 % — ABNORMAL HIGH (ref 11.5–15.5)
WBC: 9.3 10*3/uL (ref 4.0–10.5)

## 2016-06-13 NOTE — Progress Notes (Signed)
CM notes (per previous CM) pt is active with [facility] for [facility] services.  CM confirms with daughter, Peter Congo [phone] pt has all DME at home and Peter Congo is best contact for [facility] to schedule.  CM notified [facility] of pending discharge for [facility].  No other CM needs were communicated.

## 2016-06-13 NOTE — Progress Notes (Signed)
Order received to discharge Pt.  Telemetry removed and CCMD notified. IV removed with catheter intact.  Discharge education given to daughter with Pt at bedside.  Instructed daughter that Pt should not resume showering until drains have been removed or further advisement from physician.  Educated to continue to use nystatin around J tube site and apply and change dressing BID.  Educated daughter to continue to flush bilateral drains daily and record output daily.  Instructed on S/S to watch for with drain output:  Redness, purulent discharge, increase in output, change in color of output.  Instructed to notify physician if any of these situations occur.  Discussed Pt discharging on 2 mg warfarin, discussed importance of [place] following up on Pt PT/INR as soon as Pt is home d/t Pt having drains.  Daughter indicates understanding of all education provided.  Wrote all of these instructions on AVS for Pt/daughter to take home.  Daughter to follow up with [name] concerning continued TF amount and schedule.  Pt denies pain or SOB at discharge.  J tube site less painful with decreased redness.  Pt ambulated to [facility] to discharge and tol well.  Pt stable to discharge.

## 2016-06-13 NOTE — Progress Notes (Signed)
Doing well and should be able to go home later today.  [name]. [name], MD, [place] [phone] [facility]

## 2016-06-14 ENCOUNTER — Telehealth: Payer: Self-pay | Admitting: *Deleted

## 2016-06-14 ENCOUNTER — Other Ambulatory Visit: Payer: Self-pay | Admitting: General Surgery

## 2016-06-14 ENCOUNTER — Other Ambulatory Visit: Payer: Self-pay | Admitting: *Deleted

## 2016-06-14 ENCOUNTER — Encounter: Payer: Self-pay | Admitting: *Deleted

## 2016-06-14 DIAGNOSIS — K259 Gastric ulcer, unspecified as acute or chronic, without hemorrhage or perforation: Secondary | ICD-10-CM | POA: Diagnosis not present

## 2016-06-14 DIAGNOSIS — C649 Malignant neoplasm of unspecified kidney, except renal pelvis: Secondary | ICD-10-CM | POA: Diagnosis not present

## 2016-06-14 DIAGNOSIS — Z79891 Long term (current) use of opiate analgesic: Secondary | ICD-10-CM | POA: Diagnosis not present

## 2016-06-14 DIAGNOSIS — I2510 Atherosclerotic heart disease of native coronary artery without angina pectoris: Secondary | ICD-10-CM | POA: Diagnosis not present

## 2016-06-14 DIAGNOSIS — I480 Paroxysmal atrial fibrillation: Secondary | ICD-10-CM | POA: Diagnosis not present

## 2016-06-14 DIAGNOSIS — Z7901 Long term (current) use of anticoagulants: Secondary | ICD-10-CM | POA: Diagnosis not present

## 2016-06-14 DIAGNOSIS — IMO0002 Reserved for concepts with insufficient information to code with codable children: Secondary | ICD-10-CM

## 2016-06-14 DIAGNOSIS — I130 Hypertensive heart and chronic kidney disease with heart failure and stage 1 through stage 4 chronic kidney disease, or unspecified chronic kidney disease: Secondary | ICD-10-CM | POA: Diagnosis not present

## 2016-06-14 DIAGNOSIS — Z483 Aftercare following surgery for neoplasm: Secondary | ICD-10-CM | POA: Diagnosis not present

## 2016-06-14 DIAGNOSIS — K210 Gastro-esophageal reflux disease with esophagitis: Secondary | ICD-10-CM | POA: Diagnosis not present

## 2016-06-14 DIAGNOSIS — K651 Peritoneal abscess: Secondary | ICD-10-CM

## 2016-06-14 DIAGNOSIS — Z934 Other artificial openings of gastrointestinal tract status: Secondary | ICD-10-CM | POA: Diagnosis not present

## 2016-06-14 DIAGNOSIS — C169 Malignant neoplasm of stomach, unspecified: Secondary | ICD-10-CM | POA: Diagnosis not present

## 2016-06-14 NOTE — Patient Outreach (Signed)
[facility]) Care Management  06/14/2016  [name] [birth year] [id number]   RN spoke with pt today and introduced the [facility] program and services and purpose of today's call. RN completed the transition of care template and inquired on other agencies involved. Pt granted permission for RN to speak with her ([name]) concerning her ongoing recovery today.  Daughter reports the [facility] agency involved have been with this pt for the past 4 years and she feels they are providing the services needed. After several topics discussed RN offered to continue transition of care calls over the next month and extended an offer to completed community home visits to further engage in pt's ongoing needs. Daughter declined both and indicated the involved [facility] is doing a great job and she feels the pt does not need additional services at this time. RN has informed the caregiver daughter that Dr. Melford Aase will be informed that [facility] services has been declined from further involvement (caregiver verbalized an understanding). No additional request or inquires at this time as case will be closed.  Patient was recently discharged from hospital and all medications have been reviewed.  Raina Mina, RN Care Management Coordinator [place] [facility] [phone]

## 2016-06-14 NOTE — Telephone Encounter (Signed)
Spoke with Traci nurse with [facility] while in the home with the pt and she states  INR done in the hospital yesterday was 2.09 and she was sent home to take same dose of coumadin 2mg  daily  Instructed to recheck her INR on Friday and call results to coumadin clinic as she is not on any new medications or on any Prednisone or antibiotics

## 2016-06-16 DIAGNOSIS — I2510 Atherosclerotic heart disease of native coronary artery without angina pectoris: Secondary | ICD-10-CM | POA: Diagnosis not present

## 2016-06-16 DIAGNOSIS — Z7901 Long term (current) use of anticoagulants: Secondary | ICD-10-CM | POA: Diagnosis not present

## 2016-06-16 DIAGNOSIS — Z96641 Presence of right artificial hip joint: Secondary | ICD-10-CM | POA: Diagnosis not present

## 2016-06-16 DIAGNOSIS — C169 Malignant neoplasm of stomach, unspecified: Secondary | ICD-10-CM | POA: Diagnosis not present

## 2016-06-16 DIAGNOSIS — H548 Legal blindness, as defined in USA: Secondary | ICD-10-CM | POA: Diagnosis not present

## 2016-06-16 DIAGNOSIS — I69398 Other sequelae of cerebral infarction: Secondary | ICD-10-CM | POA: Diagnosis not present

## 2016-06-16 DIAGNOSIS — Z434 Encounter for attention to other artificial openings of digestive tract: Secondary | ICD-10-CM | POA: Diagnosis not present

## 2016-06-16 DIAGNOSIS — Z483 Aftercare following surgery for neoplasm: Secondary | ICD-10-CM | POA: Diagnosis not present

## 2016-06-16 DIAGNOSIS — Z79891 Long term (current) use of opiate analgesic: Secondary | ICD-10-CM | POA: Diagnosis not present

## 2016-06-16 DIAGNOSIS — Z85528 Personal history of other malignant neoplasm of kidney: Secondary | ICD-10-CM | POA: Diagnosis not present

## 2016-06-16 DIAGNOSIS — N183 Chronic kidney disease, stage 3 (moderate): Secondary | ICD-10-CM | POA: Diagnosis not present

## 2016-06-16 DIAGNOSIS — D649 Anemia, unspecified: Secondary | ICD-10-CM | POA: Diagnosis not present

## 2016-06-16 DIAGNOSIS — I480 Paroxysmal atrial fibrillation: Secondary | ICD-10-CM | POA: Diagnosis not present

## 2016-06-16 DIAGNOSIS — Z96651 Presence of right artificial knee joint: Secondary | ICD-10-CM | POA: Diagnosis not present

## 2016-06-16 DIAGNOSIS — E785 Hyperlipidemia, unspecified: Secondary | ICD-10-CM | POA: Diagnosis not present

## 2016-06-16 DIAGNOSIS — Z95810 Presence of automatic (implantable) cardiac defibrillator: Secondary | ICD-10-CM | POA: Diagnosis not present

## 2016-06-16 DIAGNOSIS — I130 Hypertensive heart and chronic kidney disease with heart failure and stage 1 through stage 4 chronic kidney disease, or unspecified chronic kidney disease: Secondary | ICD-10-CM | POA: Diagnosis not present

## 2016-06-16 DIAGNOSIS — I509 Heart failure, unspecified: Secondary | ICD-10-CM | POA: Diagnosis not present

## 2016-06-16 DIAGNOSIS — K210 Gastro-esophageal reflux disease with esophagitis: Secondary | ICD-10-CM | POA: Diagnosis not present

## 2016-06-16 DIAGNOSIS — Z5181 Encounter for therapeutic drug level monitoring: Secondary | ICD-10-CM | POA: Diagnosis not present

## 2016-06-17 ENCOUNTER — Ambulatory Visit
Admission: RE | Admit: 2016-06-17 | Discharge: 2016-06-17 | Disposition: A | Discharge status: Home / Self Care | Payer: Medicare Other | Source: Ambulatory Visit | Attending: Radiology | Admitting: Radiology

## 2016-06-17 ENCOUNTER — Ambulatory Visit
Admission: RE | Admit: 2016-06-17 | Discharge: 2016-06-17 | Disposition: A | Discharge status: Home / Self Care | Payer: Medicare Other | Source: Ambulatory Visit | Attending: General Surgery | Admitting: General Surgery

## 2016-06-17 DIAGNOSIS — K651 Peritoneal abscess: Secondary | ICD-10-CM

## 2016-06-17 DIAGNOSIS — IMO0002 Reserved for concepts with insufficient information to code with codable children: Secondary | ICD-10-CM

## 2016-06-17 DIAGNOSIS — K3184 Gastroparesis: Secondary | ICD-10-CM | POA: Diagnosis not present

## 2016-06-17 HISTORY — PX: IR GENERIC HISTORICAL: IMG1180011

## 2016-06-17 MED ORDER — IOPAMIDOL (ISOVUE-300) INJECTION 61%
100.0000 mL | Freq: Once | INTRAVENOUS | Status: AC | PRN
  Administered 2016-06-17: 100 mL via INTRAVENOUS

## 2016-06-17 NOTE — Progress Notes (Signed)
Referring Physician(s): Dr. Stark Klein  Supervising Physician: Corrie Mckusick  Patient Status:  Outpt  Chief Complaint: Abdominal abscesses  Subjective: 82 yo female with gastric cancer s/p partial gastrectomy and feeding J-tube placement. She developed post-op fluid collections/abscesses. Underwent successful perc drainage of each of these collections a few weeks ago. She has been doing well. Completed antibiotic therapy. She denies fever, abd pain. Not much output from either drain, serosanguinous. Returns today for follow up CT and drain injections.  Allergies: Adhesive [tape]; Amiodarone; Codeine; Hydrocodone; Morphine and related; Requip [ropinirole hcl]; Zinc; and Reglan [metoclopramide]  Medications: Prior to Admission medications   Medication Sig Start Date End Date Taking? Authorizing Provider  Acetaminophen (TYLENOL) 167 MG/5ML LIQD Take 10 mLs by mouth daily as needed (for pain).    Historical Provider, MD  ALPRAZolam (XANAX) 0.5 MG tablet TAKE 1/2-1 TABLET BY MOUTH 3 TIMES A DAY AS NEEDED FOR ANXIETY Patient taking differently: TAKE 1/2-1 TABLET (0.25 - 0.5 mg) BY MOUTH 3 TIMES A DAY AS NEEDED FOR ANXIETY 01/01/16   Courtney Forcucci, PA-C  Cyanocobalamin 1000 MCG/ML KIT Inject 1,000 mcg into the skin as directed. Patient taking differently: Inject 1,000 mcg into the skin every 30 (thirty) days.  11/24/15   Brunetta Genera, MD  diltiazem (CARDIZEM CD) 180 MG 24 hr capsule Take 1 capsule (180 mg total) by mouth daily. 05/05/16   [name], MD  furosemide (LASIX) 40 MG tablet TAKE 1 TABLET (40 MG TOTAL) BY MOUTH DAILY. Patient taking differently: TAKE 1/2 TABLET (20 MG TOTAL) BY MOUTH DAILY. 02/07/16   [name], MD  gabapentin (NEURONTIN) 250 MG/5ML solution Place 100 mg into feeding tube 3 (three) times daily.  04/12/16   Historical Provider, MD  levothyroxine (SYNTHROID, LEVOTHROID) 100 MCG tablet TAKE 1/2 TABLET (50 MCG TOTAL) BY MOUTH EVERY DAY ON  EMPTY STOMACH WITH A FULL GLASS OF WATER 02/13/16   Mauricio Gerome Apley, MD  metoprolol (LOPRESSOR) 100 MG tablet Take 1 tablet (100 mg total) by mouth 2 (two) times daily. 05/05/16   [name], MD  nitroGLYCERIN (NITROSTAT) 0.4 MG SL tablet Place 1 tablet (0.4 mg total) under the tongue every 5 (five) minutes as needed for chest pain (MAX 3 TABLETS). 05/19/15   Darlin Coco, MD  Nutritional Supplements (FEEDING SUPPLEMENT, VITAL 1.5 CAL,) LIQD Place 1,100 mLs into feeding tube daily. Patient taking differently: Place 35 mLs into feeding tube every hour. 35 ml per hour for 23 hours, 3.5 cans per day 03/16/16   Verlee Monte, MD  ondansetron (ZOFRAN-ODT) 8 MG disintegrating tablet Take 1 tablet (8 mg total) by mouth every 8 (eight) hours as needed for nausea. 05/31/16   Stark Klein, MD  oxyCODONE (ROXICODONE) 5 MG/5ML solution Place 5 mg into feeding tube every 4 (four) hours as needed for severe pain.    Historical Provider, MD  pantoprazole (PROTONIX) 40 MG tablet TAKE 1 TABLET (40 MG TOTAL) BY MOUTH 2 (TWO) TIMES DAILY. Patient not taking: Reported on 06/14/2016 12/11/15   [name], MD  potassium chloride 20 MEQ/15ML (10%) SOLN Take 15 mLs (20 mEq total) by mouth daily. j tube 05/31/16   Stark Klein, MD  ranitidine (ZANTAC) 150 MG tablet Take 150 mg by mouth 2 (two) times daily.    Historical Provider, MD  traMADol (ULTRAM) 50 MG tablet Take 1 tablet (50 mg total) by mouth every 6 (six) hours as needed (pain). Reported on 10/14/2015 05/31/16   Stark Klein, MD  warfarin (COUMADIN) 2  MG tablet TAKE 1 TABLET DAILY OR AS DIRECTED Patient taking differently: Take 2 mg by mouth nightly (Sun/Mon/Tues/Wed/Thurs/Fri/Sat) every day 01/26/16   [name], MD     Vital Signs: BP (!) 145/76 (BP Location: Right Leg)   Pulse 76   Temp 98.6 F (37 C) (Oral)   LMP  (Exact Date)   SpO2 98%   Physical Exam Abd: soft, NT. LUQ drain intact, site clean, NT. Scant serous output in  bulb (R)Transgluteal drain intact, site clean.  Drain injections performed. No evidence of fistula, see full report for details. Drains removed at bedside.  Imaging: No results found.  Assessment and Plan: LUQ anterior abdominal and low pelvic fluid collections/abscesses. Resolution by CT imaging today and no evidence of fistula by fluoro contrast injection today Drains removed without complications. Pt to follow up with [name] early next week.  Electronically Signed: Ascencion Dike 06/17/2016, 3:07 PM   I spent a total of 25 Minutes at the the patient's bedside AND on the patient's hospital floor or unit, greater than 50% of which was counseling/coordinating care for abdominal abscess drains.

## 2016-06-18 ENCOUNTER — Ambulatory Visit (INDEPENDENT_AMBULATORY_CARE_PROVIDER_SITE_OTHER): Payer: Medicare Other

## 2016-06-18 DIAGNOSIS — Z7901 Long term (current) use of anticoagulants: Secondary | ICD-10-CM | POA: Diagnosis not present

## 2016-06-18 DIAGNOSIS — I2510 Atherosclerotic heart disease of native coronary artery without angina pectoris: Secondary | ICD-10-CM | POA: Diagnosis not present

## 2016-06-18 DIAGNOSIS — Z434 Encounter for attention to other artificial openings of digestive tract: Secondary | ICD-10-CM | POA: Diagnosis not present

## 2016-06-18 DIAGNOSIS — I509 Heart failure, unspecified: Secondary | ICD-10-CM | POA: Diagnosis not present

## 2016-06-18 DIAGNOSIS — Z95810 Presence of automatic (implantable) cardiac defibrillator: Secondary | ICD-10-CM | POA: Diagnosis not present

## 2016-06-18 DIAGNOSIS — K210 Gastro-esophageal reflux disease with esophagitis: Secondary | ICD-10-CM | POA: Diagnosis not present

## 2016-06-18 DIAGNOSIS — I130 Hypertensive heart and chronic kidney disease with heart failure and stage 1 through stage 4 chronic kidney disease, or unspecified chronic kidney disease: Secondary | ICD-10-CM | POA: Diagnosis not present

## 2016-06-18 DIAGNOSIS — E785 Hyperlipidemia, unspecified: Secondary | ICD-10-CM | POA: Diagnosis not present

## 2016-06-18 DIAGNOSIS — I480 Paroxysmal atrial fibrillation: Secondary | ICD-10-CM | POA: Diagnosis not present

## 2016-06-18 DIAGNOSIS — Z483 Aftercare following surgery for neoplasm: Secondary | ICD-10-CM | POA: Diagnosis not present

## 2016-06-18 DIAGNOSIS — Z96641 Presence of right artificial hip joint: Secondary | ICD-10-CM | POA: Diagnosis not present

## 2016-06-18 DIAGNOSIS — D649 Anemia, unspecified: Secondary | ICD-10-CM | POA: Diagnosis not present

## 2016-06-18 DIAGNOSIS — Z5181 Encounter for therapeutic drug level monitoring: Secondary | ICD-10-CM | POA: Diagnosis not present

## 2016-06-18 DIAGNOSIS — I69398 Other sequelae of cerebral infarction: Secondary | ICD-10-CM | POA: Diagnosis not present

## 2016-06-18 DIAGNOSIS — Z85528 Personal history of other malignant neoplasm of kidney: Secondary | ICD-10-CM | POA: Diagnosis not present

## 2016-06-18 DIAGNOSIS — H548 Legal blindness, as defined in USA: Secondary | ICD-10-CM | POA: Diagnosis not present

## 2016-06-18 DIAGNOSIS — Z79891 Long term (current) use of opiate analgesic: Secondary | ICD-10-CM | POA: Diagnosis not present

## 2016-06-18 DIAGNOSIS — Z96651 Presence of right artificial knee joint: Secondary | ICD-10-CM | POA: Diagnosis not present

## 2016-06-18 DIAGNOSIS — C169 Malignant neoplasm of stomach, unspecified: Secondary | ICD-10-CM | POA: Diagnosis not present

## 2016-06-18 DIAGNOSIS — N183 Chronic kidney disease, stage 3 (moderate): Secondary | ICD-10-CM | POA: Diagnosis not present

## 2016-06-18 LAB — POCT INR: INR: 1.9

## 2016-06-22 DIAGNOSIS — Z434 Encounter for attention to other artificial openings of digestive tract: Secondary | ICD-10-CM | POA: Diagnosis not present

## 2016-06-22 DIAGNOSIS — Z85528 Personal history of other malignant neoplasm of kidney: Secondary | ICD-10-CM | POA: Diagnosis not present

## 2016-06-22 DIAGNOSIS — I69398 Other sequelae of cerebral infarction: Secondary | ICD-10-CM | POA: Diagnosis not present

## 2016-06-22 DIAGNOSIS — Z96651 Presence of right artificial knee joint: Secondary | ICD-10-CM | POA: Diagnosis not present

## 2016-06-22 DIAGNOSIS — Z95810 Presence of automatic (implantable) cardiac defibrillator: Secondary | ICD-10-CM | POA: Diagnosis not present

## 2016-06-22 DIAGNOSIS — I509 Heart failure, unspecified: Secondary | ICD-10-CM | POA: Diagnosis not present

## 2016-06-22 DIAGNOSIS — K210 Gastro-esophageal reflux disease with esophagitis: Secondary | ICD-10-CM | POA: Diagnosis not present

## 2016-06-22 DIAGNOSIS — N183 Chronic kidney disease, stage 3 (moderate): Secondary | ICD-10-CM | POA: Diagnosis not present

## 2016-06-22 DIAGNOSIS — I480 Paroxysmal atrial fibrillation: Secondary | ICD-10-CM | POA: Diagnosis not present

## 2016-06-22 DIAGNOSIS — I130 Hypertensive heart and chronic kidney disease with heart failure and stage 1 through stage 4 chronic kidney disease, or unspecified chronic kidney disease: Secondary | ICD-10-CM | POA: Diagnosis not present

## 2016-06-22 DIAGNOSIS — C169 Malignant neoplasm of stomach, unspecified: Secondary | ICD-10-CM | POA: Diagnosis not present

## 2016-06-22 DIAGNOSIS — Z7901 Long term (current) use of anticoagulants: Secondary | ICD-10-CM | POA: Diagnosis not present

## 2016-06-22 DIAGNOSIS — E785 Hyperlipidemia, unspecified: Secondary | ICD-10-CM | POA: Diagnosis not present

## 2016-06-22 DIAGNOSIS — Z79891 Long term (current) use of opiate analgesic: Secondary | ICD-10-CM | POA: Diagnosis not present

## 2016-06-22 DIAGNOSIS — Z483 Aftercare following surgery for neoplasm: Secondary | ICD-10-CM | POA: Diagnosis not present

## 2016-06-22 DIAGNOSIS — I2510 Atherosclerotic heart disease of native coronary artery without angina pectoris: Secondary | ICD-10-CM | POA: Diagnosis not present

## 2016-06-22 DIAGNOSIS — Z96641 Presence of right artificial hip joint: Secondary | ICD-10-CM | POA: Diagnosis not present

## 2016-06-22 DIAGNOSIS — H548 Legal blindness, as defined in USA: Secondary | ICD-10-CM | POA: Diagnosis not present

## 2016-06-22 DIAGNOSIS — Z5181 Encounter for therapeutic drug level monitoring: Secondary | ICD-10-CM | POA: Diagnosis not present

## 2016-06-22 DIAGNOSIS — D649 Anemia, unspecified: Secondary | ICD-10-CM | POA: Diagnosis not present

## 2016-06-23 DIAGNOSIS — Z96651 Presence of right artificial knee joint: Secondary | ICD-10-CM | POA: Diagnosis not present

## 2016-06-23 DIAGNOSIS — I509 Heart failure, unspecified: Secondary | ICD-10-CM | POA: Diagnosis not present

## 2016-06-23 DIAGNOSIS — K210 Gastro-esophageal reflux disease with esophagitis: Secondary | ICD-10-CM | POA: Diagnosis not present

## 2016-06-23 DIAGNOSIS — Z85528 Personal history of other malignant neoplasm of kidney: Secondary | ICD-10-CM | POA: Diagnosis not present

## 2016-06-23 DIAGNOSIS — Z483 Aftercare following surgery for neoplasm: Secondary | ICD-10-CM | POA: Diagnosis not present

## 2016-06-23 DIAGNOSIS — Z434 Encounter for attention to other artificial openings of digestive tract: Secondary | ICD-10-CM | POA: Diagnosis not present

## 2016-06-23 DIAGNOSIS — I130 Hypertensive heart and chronic kidney disease with heart failure and stage 1 through stage 4 chronic kidney disease, or unspecified chronic kidney disease: Secondary | ICD-10-CM | POA: Diagnosis not present

## 2016-06-23 DIAGNOSIS — Z7901 Long term (current) use of anticoagulants: Secondary | ICD-10-CM | POA: Diagnosis not present

## 2016-06-23 DIAGNOSIS — Z79891 Long term (current) use of opiate analgesic: Secondary | ICD-10-CM | POA: Diagnosis not present

## 2016-06-23 DIAGNOSIS — Z96641 Presence of right artificial hip joint: Secondary | ICD-10-CM | POA: Diagnosis not present

## 2016-06-23 DIAGNOSIS — Z5181 Encounter for therapeutic drug level monitoring: Secondary | ICD-10-CM | POA: Diagnosis not present

## 2016-06-23 DIAGNOSIS — I69398 Other sequelae of cerebral infarction: Secondary | ICD-10-CM | POA: Diagnosis not present

## 2016-06-23 DIAGNOSIS — I2510 Atherosclerotic heart disease of native coronary artery without angina pectoris: Secondary | ICD-10-CM | POA: Diagnosis not present

## 2016-06-23 DIAGNOSIS — C169 Malignant neoplasm of stomach, unspecified: Secondary | ICD-10-CM | POA: Diagnosis not present

## 2016-06-23 DIAGNOSIS — Z95810 Presence of automatic (implantable) cardiac defibrillator: Secondary | ICD-10-CM | POA: Diagnosis not present

## 2016-06-23 DIAGNOSIS — D649 Anemia, unspecified: Secondary | ICD-10-CM | POA: Diagnosis not present

## 2016-06-23 DIAGNOSIS — E785 Hyperlipidemia, unspecified: Secondary | ICD-10-CM | POA: Diagnosis not present

## 2016-06-23 DIAGNOSIS — H548 Legal blindness, as defined in USA: Secondary | ICD-10-CM | POA: Diagnosis not present

## 2016-06-23 DIAGNOSIS — I480 Paroxysmal atrial fibrillation: Secondary | ICD-10-CM | POA: Diagnosis not present

## 2016-06-23 DIAGNOSIS — N183 Chronic kidney disease, stage 3 (moderate): Secondary | ICD-10-CM | POA: Diagnosis not present

## 2016-06-24 DIAGNOSIS — I130 Hypertensive heart and chronic kidney disease with heart failure and stage 1 through stage 4 chronic kidney disease, or unspecified chronic kidney disease: Secondary | ICD-10-CM | POA: Diagnosis not present

## 2016-06-24 DIAGNOSIS — Z5181 Encounter for therapeutic drug level monitoring: Secondary | ICD-10-CM | POA: Diagnosis not present

## 2016-06-24 DIAGNOSIS — Z434 Encounter for attention to other artificial openings of digestive tract: Secondary | ICD-10-CM | POA: Diagnosis not present

## 2016-06-24 DIAGNOSIS — C169 Malignant neoplasm of stomach, unspecified: Secondary | ICD-10-CM | POA: Diagnosis not present

## 2016-06-24 DIAGNOSIS — Z95810 Presence of automatic (implantable) cardiac defibrillator: Secondary | ICD-10-CM | POA: Diagnosis not present

## 2016-06-24 DIAGNOSIS — I480 Paroxysmal atrial fibrillation: Secondary | ICD-10-CM | POA: Diagnosis not present

## 2016-06-24 DIAGNOSIS — Z96651 Presence of right artificial knee joint: Secondary | ICD-10-CM | POA: Diagnosis not present

## 2016-06-24 DIAGNOSIS — Z483 Aftercare following surgery for neoplasm: Secondary | ICD-10-CM | POA: Diagnosis not present

## 2016-06-24 DIAGNOSIS — K210 Gastro-esophageal reflux disease with esophagitis: Secondary | ICD-10-CM | POA: Diagnosis not present

## 2016-06-24 DIAGNOSIS — Z7901 Long term (current) use of anticoagulants: Secondary | ICD-10-CM | POA: Diagnosis not present

## 2016-06-24 DIAGNOSIS — I509 Heart failure, unspecified: Secondary | ICD-10-CM | POA: Diagnosis not present

## 2016-06-24 DIAGNOSIS — Z85528 Personal history of other malignant neoplasm of kidney: Secondary | ICD-10-CM | POA: Diagnosis not present

## 2016-06-24 DIAGNOSIS — H548 Legal blindness, as defined in USA: Secondary | ICD-10-CM | POA: Diagnosis not present

## 2016-06-24 DIAGNOSIS — Z79891 Long term (current) use of opiate analgesic: Secondary | ICD-10-CM | POA: Diagnosis not present

## 2016-06-24 DIAGNOSIS — Z96641 Presence of right artificial hip joint: Secondary | ICD-10-CM | POA: Diagnosis not present

## 2016-06-24 DIAGNOSIS — D649 Anemia, unspecified: Secondary | ICD-10-CM | POA: Diagnosis not present

## 2016-06-24 DIAGNOSIS — N183 Chronic kidney disease, stage 3 (moderate): Secondary | ICD-10-CM | POA: Diagnosis not present

## 2016-06-24 DIAGNOSIS — E785 Hyperlipidemia, unspecified: Secondary | ICD-10-CM | POA: Diagnosis not present

## 2016-06-24 DIAGNOSIS — I69398 Other sequelae of cerebral infarction: Secondary | ICD-10-CM | POA: Diagnosis not present

## 2016-06-24 DIAGNOSIS — I2510 Atherosclerotic heart disease of native coronary artery without angina pectoris: Secondary | ICD-10-CM | POA: Diagnosis not present

## 2016-06-25 ENCOUNTER — Ambulatory Visit (INDEPENDENT_AMBULATORY_CARE_PROVIDER_SITE_OTHER): Payer: Medicare Other | Admitting: Cardiovascular Disease

## 2016-06-25 DIAGNOSIS — Z85528 Personal history of other malignant neoplasm of kidney: Secondary | ICD-10-CM | POA: Diagnosis not present

## 2016-06-25 DIAGNOSIS — H548 Legal blindness, as defined in USA: Secondary | ICD-10-CM | POA: Diagnosis not present

## 2016-06-25 DIAGNOSIS — I2510 Atherosclerotic heart disease of native coronary artery without angina pectoris: Secondary | ICD-10-CM | POA: Diagnosis not present

## 2016-06-25 DIAGNOSIS — I509 Heart failure, unspecified: Secondary | ICD-10-CM | POA: Diagnosis not present

## 2016-06-25 DIAGNOSIS — I69398 Other sequelae of cerebral infarction: Secondary | ICD-10-CM | POA: Diagnosis not present

## 2016-06-25 DIAGNOSIS — N183 Chronic kidney disease, stage 3 (moderate): Secondary | ICD-10-CM | POA: Diagnosis not present

## 2016-06-25 DIAGNOSIS — Z7901 Long term (current) use of anticoagulants: Secondary | ICD-10-CM | POA: Diagnosis not present

## 2016-06-25 DIAGNOSIS — K210 Gastro-esophageal reflux disease with esophagitis: Secondary | ICD-10-CM | POA: Diagnosis not present

## 2016-06-25 DIAGNOSIS — Z434 Encounter for attention to other artificial openings of digestive tract: Secondary | ICD-10-CM | POA: Diagnosis not present

## 2016-06-25 DIAGNOSIS — C169 Malignant neoplasm of stomach, unspecified: Secondary | ICD-10-CM | POA: Diagnosis not present

## 2016-06-25 DIAGNOSIS — Z5181 Encounter for therapeutic drug level monitoring: Secondary | ICD-10-CM | POA: Diagnosis not present

## 2016-06-25 DIAGNOSIS — E785 Hyperlipidemia, unspecified: Secondary | ICD-10-CM | POA: Diagnosis not present

## 2016-06-25 DIAGNOSIS — I130 Hypertensive heart and chronic kidney disease with heart failure and stage 1 through stage 4 chronic kidney disease, or unspecified chronic kidney disease: Secondary | ICD-10-CM | POA: Diagnosis not present

## 2016-06-25 DIAGNOSIS — Z96651 Presence of right artificial knee joint: Secondary | ICD-10-CM | POA: Diagnosis not present

## 2016-06-25 DIAGNOSIS — Z483 Aftercare following surgery for neoplasm: Secondary | ICD-10-CM | POA: Diagnosis not present

## 2016-06-25 DIAGNOSIS — Z79891 Long term (current) use of opiate analgesic: Secondary | ICD-10-CM | POA: Diagnosis not present

## 2016-06-25 DIAGNOSIS — D649 Anemia, unspecified: Secondary | ICD-10-CM | POA: Diagnosis not present

## 2016-06-25 DIAGNOSIS — Z95810 Presence of automatic (implantable) cardiac defibrillator: Secondary | ICD-10-CM | POA: Diagnosis not present

## 2016-06-25 DIAGNOSIS — Z96641 Presence of right artificial hip joint: Secondary | ICD-10-CM | POA: Diagnosis not present

## 2016-06-25 DIAGNOSIS — I480 Paroxysmal atrial fibrillation: Secondary | ICD-10-CM | POA: Diagnosis not present

## 2016-06-25 LAB — POCT INR: INR: 1.4

## 2016-06-29 DIAGNOSIS — C169 Malignant neoplasm of stomach, unspecified: Secondary | ICD-10-CM | POA: Diagnosis not present

## 2016-06-29 DIAGNOSIS — Z96651 Presence of right artificial knee joint: Secondary | ICD-10-CM | POA: Diagnosis not present

## 2016-06-29 DIAGNOSIS — Z434 Encounter for attention to other artificial openings of digestive tract: Secondary | ICD-10-CM | POA: Diagnosis not present

## 2016-06-29 DIAGNOSIS — Z96641 Presence of right artificial hip joint: Secondary | ICD-10-CM | POA: Diagnosis not present

## 2016-06-29 DIAGNOSIS — K210 Gastro-esophageal reflux disease with esophagitis: Secondary | ICD-10-CM | POA: Diagnosis not present

## 2016-06-29 DIAGNOSIS — I130 Hypertensive heart and chronic kidney disease with heart failure and stage 1 through stage 4 chronic kidney disease, or unspecified chronic kidney disease: Secondary | ICD-10-CM | POA: Diagnosis not present

## 2016-06-29 DIAGNOSIS — Z95810 Presence of automatic (implantable) cardiac defibrillator: Secondary | ICD-10-CM | POA: Diagnosis not present

## 2016-06-29 DIAGNOSIS — Z79891 Long term (current) use of opiate analgesic: Secondary | ICD-10-CM | POA: Diagnosis not present

## 2016-06-29 DIAGNOSIS — Z7901 Long term (current) use of anticoagulants: Secondary | ICD-10-CM | POA: Diagnosis not present

## 2016-06-29 DIAGNOSIS — I509 Heart failure, unspecified: Secondary | ICD-10-CM | POA: Diagnosis not present

## 2016-06-29 DIAGNOSIS — D649 Anemia, unspecified: Secondary | ICD-10-CM | POA: Diagnosis not present

## 2016-06-29 DIAGNOSIS — I480 Paroxysmal atrial fibrillation: Secondary | ICD-10-CM | POA: Diagnosis not present

## 2016-06-29 DIAGNOSIS — I2510 Atherosclerotic heart disease of native coronary artery without angina pectoris: Secondary | ICD-10-CM | POA: Diagnosis not present

## 2016-06-29 DIAGNOSIS — H548 Legal blindness, as defined in USA: Secondary | ICD-10-CM | POA: Diagnosis not present

## 2016-06-29 DIAGNOSIS — E785 Hyperlipidemia, unspecified: Secondary | ICD-10-CM | POA: Diagnosis not present

## 2016-06-29 DIAGNOSIS — Z85528 Personal history of other malignant neoplasm of kidney: Secondary | ICD-10-CM | POA: Diagnosis not present

## 2016-06-29 DIAGNOSIS — I69398 Other sequelae of cerebral infarction: Secondary | ICD-10-CM | POA: Diagnosis not present

## 2016-06-29 DIAGNOSIS — N183 Chronic kidney disease, stage 3 (moderate): Secondary | ICD-10-CM | POA: Diagnosis not present

## 2016-06-29 DIAGNOSIS — Z5181 Encounter for therapeutic drug level monitoring: Secondary | ICD-10-CM | POA: Diagnosis not present

## 2016-06-29 DIAGNOSIS — Z483 Aftercare following surgery for neoplasm: Secondary | ICD-10-CM | POA: Diagnosis not present

## 2016-06-30 ENCOUNTER — Other Ambulatory Visit: Payer: Self-pay | Admitting: *Deleted

## 2016-06-30 MED ORDER — GABAPENTIN 300 MG PO CAPS: 300.0000 mg | ORAL_CAPSULE | Freq: Three times a day (TID) | ORAL | 1 refills | Status: DC

## 2016-07-02 ENCOUNTER — Ambulatory Visit (INDEPENDENT_AMBULATORY_CARE_PROVIDER_SITE_OTHER): Payer: Medicare Other | Admitting: Cardiology

## 2016-07-02 DIAGNOSIS — Z85528 Personal history of other malignant neoplasm of kidney: Secondary | ICD-10-CM | POA: Diagnosis not present

## 2016-07-02 DIAGNOSIS — K210 Gastro-esophageal reflux disease with esophagitis: Secondary | ICD-10-CM | POA: Diagnosis not present

## 2016-07-02 DIAGNOSIS — Z5181 Encounter for therapeutic drug level monitoring: Secondary | ICD-10-CM | POA: Diagnosis not present

## 2016-07-02 DIAGNOSIS — I2510 Atherosclerotic heart disease of native coronary artery without angina pectoris: Secondary | ICD-10-CM | POA: Diagnosis not present

## 2016-07-02 DIAGNOSIS — Z96651 Presence of right artificial knee joint: Secondary | ICD-10-CM | POA: Diagnosis not present

## 2016-07-02 DIAGNOSIS — H548 Legal blindness, as defined in USA: Secondary | ICD-10-CM | POA: Diagnosis not present

## 2016-07-02 DIAGNOSIS — Z7901 Long term (current) use of anticoagulants: Secondary | ICD-10-CM | POA: Diagnosis not present

## 2016-07-02 DIAGNOSIS — N183 Chronic kidney disease, stage 3 (moderate): Secondary | ICD-10-CM | POA: Diagnosis not present

## 2016-07-02 DIAGNOSIS — E785 Hyperlipidemia, unspecified: Secondary | ICD-10-CM | POA: Diagnosis not present

## 2016-07-02 DIAGNOSIS — I480 Paroxysmal atrial fibrillation: Secondary | ICD-10-CM | POA: Diagnosis not present

## 2016-07-02 DIAGNOSIS — I130 Hypertensive heart and chronic kidney disease with heart failure and stage 1 through stage 4 chronic kidney disease, or unspecified chronic kidney disease: Secondary | ICD-10-CM | POA: Diagnosis not present

## 2016-07-02 DIAGNOSIS — I69398 Other sequelae of cerebral infarction: Secondary | ICD-10-CM | POA: Diagnosis not present

## 2016-07-02 DIAGNOSIS — C169 Malignant neoplasm of stomach, unspecified: Secondary | ICD-10-CM | POA: Diagnosis not present

## 2016-07-02 DIAGNOSIS — Z483 Aftercare following surgery for neoplasm: Secondary | ICD-10-CM | POA: Diagnosis not present

## 2016-07-02 DIAGNOSIS — Z434 Encounter for attention to other artificial openings of digestive tract: Secondary | ICD-10-CM | POA: Diagnosis not present

## 2016-07-02 DIAGNOSIS — I509 Heart failure, unspecified: Secondary | ICD-10-CM | POA: Diagnosis not present

## 2016-07-02 DIAGNOSIS — Z79891 Long term (current) use of opiate analgesic: Secondary | ICD-10-CM | POA: Diagnosis not present

## 2016-07-02 DIAGNOSIS — Z96641 Presence of right artificial hip joint: Secondary | ICD-10-CM | POA: Diagnosis not present

## 2016-07-02 DIAGNOSIS — Z95810 Presence of automatic (implantable) cardiac defibrillator: Secondary | ICD-10-CM | POA: Diagnosis not present

## 2016-07-02 DIAGNOSIS — D649 Anemia, unspecified: Secondary | ICD-10-CM | POA: Diagnosis not present

## 2016-07-02 LAB — POCT INR: INR: 1.7

## 2016-07-06 DIAGNOSIS — I480 Paroxysmal atrial fibrillation: Secondary | ICD-10-CM | POA: Diagnosis not present

## 2016-07-06 DIAGNOSIS — Z483 Aftercare following surgery for neoplasm: Secondary | ICD-10-CM | POA: Diagnosis not present

## 2016-07-06 DIAGNOSIS — N183 Chronic kidney disease, stage 3 (moderate): Secondary | ICD-10-CM | POA: Diagnosis not present

## 2016-07-06 DIAGNOSIS — I130 Hypertensive heart and chronic kidney disease with heart failure and stage 1 through stage 4 chronic kidney disease, or unspecified chronic kidney disease: Secondary | ICD-10-CM | POA: Diagnosis not present

## 2016-07-06 DIAGNOSIS — Z79891 Long term (current) use of opiate analgesic: Secondary | ICD-10-CM | POA: Diagnosis not present

## 2016-07-06 DIAGNOSIS — Z434 Encounter for attention to other artificial openings of digestive tract: Secondary | ICD-10-CM | POA: Diagnosis not present

## 2016-07-06 DIAGNOSIS — E785 Hyperlipidemia, unspecified: Secondary | ICD-10-CM | POA: Diagnosis not present

## 2016-07-06 DIAGNOSIS — D649 Anemia, unspecified: Secondary | ICD-10-CM | POA: Diagnosis not present

## 2016-07-06 DIAGNOSIS — K210 Gastro-esophageal reflux disease with esophagitis: Secondary | ICD-10-CM | POA: Diagnosis not present

## 2016-07-06 DIAGNOSIS — Z85528 Personal history of other malignant neoplasm of kidney: Secondary | ICD-10-CM | POA: Diagnosis not present

## 2016-07-06 DIAGNOSIS — Z5181 Encounter for therapeutic drug level monitoring: Secondary | ICD-10-CM | POA: Diagnosis not present

## 2016-07-06 DIAGNOSIS — I2510 Atherosclerotic heart disease of native coronary artery without angina pectoris: Secondary | ICD-10-CM | POA: Diagnosis not present

## 2016-07-06 DIAGNOSIS — Z96651 Presence of right artificial knee joint: Secondary | ICD-10-CM | POA: Diagnosis not present

## 2016-07-06 DIAGNOSIS — I69398 Other sequelae of cerebral infarction: Secondary | ICD-10-CM | POA: Diagnosis not present

## 2016-07-06 DIAGNOSIS — Z96641 Presence of right artificial hip joint: Secondary | ICD-10-CM | POA: Diagnosis not present

## 2016-07-06 DIAGNOSIS — Z95810 Presence of automatic (implantable) cardiac defibrillator: Secondary | ICD-10-CM | POA: Diagnosis not present

## 2016-07-06 DIAGNOSIS — C169 Malignant neoplasm of stomach, unspecified: Secondary | ICD-10-CM | POA: Diagnosis not present

## 2016-07-06 DIAGNOSIS — Z7901 Long term (current) use of anticoagulants: Secondary | ICD-10-CM | POA: Diagnosis not present

## 2016-07-06 DIAGNOSIS — I509 Heart failure, unspecified: Secondary | ICD-10-CM | POA: Diagnosis not present

## 2016-07-06 DIAGNOSIS — H548 Legal blindness, as defined in USA: Secondary | ICD-10-CM | POA: Diagnosis not present

## 2016-07-08 DIAGNOSIS — I480 Paroxysmal atrial fibrillation: Secondary | ICD-10-CM | POA: Diagnosis not present

## 2016-07-08 DIAGNOSIS — Z95810 Presence of automatic (implantable) cardiac defibrillator: Secondary | ICD-10-CM | POA: Diagnosis not present

## 2016-07-08 DIAGNOSIS — I69398 Other sequelae of cerebral infarction: Secondary | ICD-10-CM | POA: Diagnosis not present

## 2016-07-08 DIAGNOSIS — Z483 Aftercare following surgery for neoplasm: Secondary | ICD-10-CM | POA: Diagnosis not present

## 2016-07-08 DIAGNOSIS — I2510 Atherosclerotic heart disease of native coronary artery without angina pectoris: Secondary | ICD-10-CM | POA: Diagnosis not present

## 2016-07-08 DIAGNOSIS — K210 Gastro-esophageal reflux disease with esophagitis: Secondary | ICD-10-CM | POA: Diagnosis not present

## 2016-07-08 DIAGNOSIS — C169 Malignant neoplasm of stomach, unspecified: Secondary | ICD-10-CM | POA: Diagnosis not present

## 2016-07-08 DIAGNOSIS — D649 Anemia, unspecified: Secondary | ICD-10-CM | POA: Diagnosis not present

## 2016-07-08 DIAGNOSIS — Z7901 Long term (current) use of anticoagulants: Secondary | ICD-10-CM | POA: Diagnosis not present

## 2016-07-08 DIAGNOSIS — Z96651 Presence of right artificial knee joint: Secondary | ICD-10-CM | POA: Diagnosis not present

## 2016-07-08 DIAGNOSIS — Z96641 Presence of right artificial hip joint: Secondary | ICD-10-CM | POA: Diagnosis not present

## 2016-07-08 DIAGNOSIS — H548 Legal blindness, as defined in USA: Secondary | ICD-10-CM | POA: Diagnosis not present

## 2016-07-08 DIAGNOSIS — I509 Heart failure, unspecified: Secondary | ICD-10-CM | POA: Diagnosis not present

## 2016-07-08 DIAGNOSIS — N183 Chronic kidney disease, stage 3 (moderate): Secondary | ICD-10-CM | POA: Diagnosis not present

## 2016-07-08 DIAGNOSIS — Z79891 Long term (current) use of opiate analgesic: Secondary | ICD-10-CM | POA: Diagnosis not present

## 2016-07-08 DIAGNOSIS — E785 Hyperlipidemia, unspecified: Secondary | ICD-10-CM | POA: Diagnosis not present

## 2016-07-08 DIAGNOSIS — Z434 Encounter for attention to other artificial openings of digestive tract: Secondary | ICD-10-CM | POA: Diagnosis not present

## 2016-07-08 DIAGNOSIS — I130 Hypertensive heart and chronic kidney disease with heart failure and stage 1 through stage 4 chronic kidney disease, or unspecified chronic kidney disease: Secondary | ICD-10-CM | POA: Diagnosis not present

## 2016-07-08 DIAGNOSIS — Z5181 Encounter for therapeutic drug level monitoring: Secondary | ICD-10-CM | POA: Diagnosis not present

## 2016-07-08 DIAGNOSIS — Z85528 Personal history of other malignant neoplasm of kidney: Secondary | ICD-10-CM | POA: Diagnosis not present

## 2016-07-09 ENCOUNTER — Ambulatory Visit (INDEPENDENT_AMBULATORY_CARE_PROVIDER_SITE_OTHER): Payer: Medicare Other | Admitting: Cardiology

## 2016-07-09 DIAGNOSIS — Z7901 Long term (current) use of anticoagulants: Secondary | ICD-10-CM | POA: Diagnosis not present

## 2016-07-09 DIAGNOSIS — Z85528 Personal history of other malignant neoplasm of kidney: Secondary | ICD-10-CM | POA: Diagnosis not present

## 2016-07-09 DIAGNOSIS — N183 Chronic kidney disease, stage 3 (moderate): Secondary | ICD-10-CM | POA: Diagnosis not present

## 2016-07-09 DIAGNOSIS — Z5181 Encounter for therapeutic drug level monitoring: Secondary | ICD-10-CM | POA: Diagnosis not present

## 2016-07-09 DIAGNOSIS — I130 Hypertensive heart and chronic kidney disease with heart failure and stage 1 through stage 4 chronic kidney disease, or unspecified chronic kidney disease: Secondary | ICD-10-CM | POA: Diagnosis not present

## 2016-07-09 DIAGNOSIS — I480 Paroxysmal atrial fibrillation: Secondary | ICD-10-CM | POA: Diagnosis not present

## 2016-07-09 DIAGNOSIS — E785 Hyperlipidemia, unspecified: Secondary | ICD-10-CM | POA: Diagnosis not present

## 2016-07-09 DIAGNOSIS — I2510 Atherosclerotic heart disease of native coronary artery without angina pectoris: Secondary | ICD-10-CM | POA: Diagnosis not present

## 2016-07-09 DIAGNOSIS — C169 Malignant neoplasm of stomach, unspecified: Secondary | ICD-10-CM | POA: Diagnosis not present

## 2016-07-09 DIAGNOSIS — Z96641 Presence of right artificial hip joint: Secondary | ICD-10-CM | POA: Diagnosis not present

## 2016-07-09 DIAGNOSIS — H548 Legal blindness, as defined in USA: Secondary | ICD-10-CM | POA: Diagnosis not present

## 2016-07-09 DIAGNOSIS — Z96651 Presence of right artificial knee joint: Secondary | ICD-10-CM | POA: Diagnosis not present

## 2016-07-09 DIAGNOSIS — I509 Heart failure, unspecified: Secondary | ICD-10-CM | POA: Diagnosis not present

## 2016-07-09 DIAGNOSIS — Z483 Aftercare following surgery for neoplasm: Secondary | ICD-10-CM | POA: Diagnosis not present

## 2016-07-09 DIAGNOSIS — Z434 Encounter for attention to other artificial openings of digestive tract: Secondary | ICD-10-CM | POA: Diagnosis not present

## 2016-07-09 DIAGNOSIS — Z95810 Presence of automatic (implantable) cardiac defibrillator: Secondary | ICD-10-CM | POA: Diagnosis not present

## 2016-07-09 DIAGNOSIS — I69398 Other sequelae of cerebral infarction: Secondary | ICD-10-CM | POA: Diagnosis not present

## 2016-07-09 DIAGNOSIS — Z79891 Long term (current) use of opiate analgesic: Secondary | ICD-10-CM | POA: Diagnosis not present

## 2016-07-09 DIAGNOSIS — K210 Gastro-esophageal reflux disease with esophagitis: Secondary | ICD-10-CM | POA: Diagnosis not present

## 2016-07-09 DIAGNOSIS — D649 Anemia, unspecified: Secondary | ICD-10-CM | POA: Diagnosis not present

## 2016-07-09 LAB — POCT INR: INR: 2.2

## 2016-07-13 DIAGNOSIS — D649 Anemia, unspecified: Secondary | ICD-10-CM | POA: Diagnosis not present

## 2016-07-13 DIAGNOSIS — E785 Hyperlipidemia, unspecified: Secondary | ICD-10-CM | POA: Diagnosis not present

## 2016-07-13 DIAGNOSIS — I130 Hypertensive heart and chronic kidney disease with heart failure and stage 1 through stage 4 chronic kidney disease, or unspecified chronic kidney disease: Secondary | ICD-10-CM | POA: Diagnosis not present

## 2016-07-13 DIAGNOSIS — I509 Heart failure, unspecified: Secondary | ICD-10-CM | POA: Diagnosis not present

## 2016-07-13 DIAGNOSIS — N183 Chronic kidney disease, stage 3 (moderate): Secondary | ICD-10-CM | POA: Diagnosis not present

## 2016-07-13 DIAGNOSIS — I480 Paroxysmal atrial fibrillation: Secondary | ICD-10-CM | POA: Diagnosis not present

## 2016-07-13 DIAGNOSIS — Z79891 Long term (current) use of opiate analgesic: Secondary | ICD-10-CM | POA: Diagnosis not present

## 2016-07-13 DIAGNOSIS — I69398 Other sequelae of cerebral infarction: Secondary | ICD-10-CM | POA: Diagnosis not present

## 2016-07-13 DIAGNOSIS — Z7901 Long term (current) use of anticoagulants: Secondary | ICD-10-CM | POA: Diagnosis not present

## 2016-07-13 DIAGNOSIS — Z434 Encounter for attention to other artificial openings of digestive tract: Secondary | ICD-10-CM | POA: Diagnosis not present

## 2016-07-13 DIAGNOSIS — Z95810 Presence of automatic (implantable) cardiac defibrillator: Secondary | ICD-10-CM | POA: Diagnosis not present

## 2016-07-13 DIAGNOSIS — H548 Legal blindness, as defined in USA: Secondary | ICD-10-CM | POA: Diagnosis not present

## 2016-07-13 DIAGNOSIS — C169 Malignant neoplasm of stomach, unspecified: Secondary | ICD-10-CM | POA: Diagnosis not present

## 2016-07-13 DIAGNOSIS — Z85528 Personal history of other malignant neoplasm of kidney: Secondary | ICD-10-CM | POA: Diagnosis not present

## 2016-07-13 DIAGNOSIS — Z5181 Encounter for therapeutic drug level monitoring: Secondary | ICD-10-CM | POA: Diagnosis not present

## 2016-07-13 DIAGNOSIS — K210 Gastro-esophageal reflux disease with esophagitis: Secondary | ICD-10-CM | POA: Diagnosis not present

## 2016-07-13 DIAGNOSIS — Z96641 Presence of right artificial hip joint: Secondary | ICD-10-CM | POA: Diagnosis not present

## 2016-07-13 DIAGNOSIS — Z96651 Presence of right artificial knee joint: Secondary | ICD-10-CM | POA: Diagnosis not present

## 2016-07-13 DIAGNOSIS — Z483 Aftercare following surgery for neoplasm: Secondary | ICD-10-CM | POA: Diagnosis not present

## 2016-07-13 DIAGNOSIS — I2510 Atherosclerotic heart disease of native coronary artery without angina pectoris: Secondary | ICD-10-CM | POA: Diagnosis not present

## 2016-07-16 ENCOUNTER — Other Ambulatory Visit: Payer: Self-pay | Admitting: *Deleted

## 2016-07-16 DIAGNOSIS — E785 Hyperlipidemia, unspecified: Secondary | ICD-10-CM | POA: Diagnosis not present

## 2016-07-16 DIAGNOSIS — H548 Legal blindness, as defined in USA: Secondary | ICD-10-CM | POA: Diagnosis not present

## 2016-07-16 DIAGNOSIS — Z434 Encounter for attention to other artificial openings of digestive tract: Secondary | ICD-10-CM | POA: Diagnosis not present

## 2016-07-16 DIAGNOSIS — I2510 Atherosclerotic heart disease of native coronary artery without angina pectoris: Secondary | ICD-10-CM | POA: Diagnosis not present

## 2016-07-16 DIAGNOSIS — Z85528 Personal history of other malignant neoplasm of kidney: Secondary | ICD-10-CM | POA: Diagnosis not present

## 2016-07-16 DIAGNOSIS — C169 Malignant neoplasm of stomach, unspecified: Secondary | ICD-10-CM | POA: Diagnosis not present

## 2016-07-16 DIAGNOSIS — I480 Paroxysmal atrial fibrillation: Secondary | ICD-10-CM | POA: Diagnosis not present

## 2016-07-16 DIAGNOSIS — Z95810 Presence of automatic (implantable) cardiac defibrillator: Secondary | ICD-10-CM | POA: Diagnosis not present

## 2016-07-16 DIAGNOSIS — K210 Gastro-esophageal reflux disease with esophagitis: Secondary | ICD-10-CM | POA: Diagnosis not present

## 2016-07-16 DIAGNOSIS — Z96641 Presence of right artificial hip joint: Secondary | ICD-10-CM | POA: Diagnosis not present

## 2016-07-16 DIAGNOSIS — Z7901 Long term (current) use of anticoagulants: Secondary | ICD-10-CM | POA: Diagnosis not present

## 2016-07-16 DIAGNOSIS — Z483 Aftercare following surgery for neoplasm: Secondary | ICD-10-CM | POA: Diagnosis not present

## 2016-07-16 DIAGNOSIS — Z79891 Long term (current) use of opiate analgesic: Secondary | ICD-10-CM | POA: Diagnosis not present

## 2016-07-16 DIAGNOSIS — N183 Chronic kidney disease, stage 3 (moderate): Secondary | ICD-10-CM | POA: Diagnosis not present

## 2016-07-16 DIAGNOSIS — D649 Anemia, unspecified: Secondary | ICD-10-CM | POA: Diagnosis not present

## 2016-07-16 DIAGNOSIS — Z5181 Encounter for therapeutic drug level monitoring: Secondary | ICD-10-CM | POA: Diagnosis not present

## 2016-07-16 DIAGNOSIS — Z96651 Presence of right artificial knee joint: Secondary | ICD-10-CM | POA: Diagnosis not present

## 2016-07-16 DIAGNOSIS — I69398 Other sequelae of cerebral infarction: Secondary | ICD-10-CM | POA: Diagnosis not present

## 2016-07-16 DIAGNOSIS — I130 Hypertensive heart and chronic kidney disease with heart failure and stage 1 through stage 4 chronic kidney disease, or unspecified chronic kidney disease: Secondary | ICD-10-CM | POA: Diagnosis not present

## 2016-07-16 DIAGNOSIS — I509 Heart failure, unspecified: Secondary | ICD-10-CM | POA: Diagnosis not present

## 2016-07-16 MED ORDER — POTASSIUM CHLORIDE 20 MEQ/15ML (10%) PO SOLN: 20.0000 meq | Freq: Every day | ORAL | 0 refills | Status: DC

## 2016-07-17 DIAGNOSIS — C169 Malignant neoplasm of stomach, unspecified: Secondary | ICD-10-CM | POA: Diagnosis not present

## 2016-07-19 NOTE — Discharge Summary (Signed)
Physician Discharge Summary  Patient ID: [name] MRN: [id number] DOB/AGE: 80/18/1935 80 y.o.  Admit date: 06/03/2016 Discharge date: 07/19/2016  Admission Diagnoses: Patient Active Problem List   Diagnosis Date Noted  . Post-operative nausea and vomiting 06/03/2016  . Esophagitis 05/18/2016  . Stomach cancer (HCC) 05/18/2016  . Long term (current) use of anticoagulants [Z79.01] 03/19/2016  . No blood products 02/27/2016  . AKI (acute kidney injury) (HCC) 02/17/2016  . S/P partial gastrectomy 02/17/2016  . Post gastrectomy syndrome   . Erosive esophagitis   . Gastroesophageal reflux disease with esophagitis   . Pressure ulcer 02/04/2016  . Gastric cancer (HCC) 01/15/2016  . Persistent atrial fibrillation (HCC) 01/15/2016  . Protein-calorie malnutrition, moderate (HCC) 12/08/2015  . B12 deficiency 11/24/2015  . Iron deficiency anemia due to chronic blood loss 11/24/2015  . Refusal of blood transfusions as patient is Jehov[employer]'s Witness   . Adenocarcinoma of stomach (HCC) 11/11/2015  . CKD (chronic kidney disease), stage III 11/10/2015  . Vitamin B12 deficiency 11/09/2015  . Peripheral nerve disease (HCC) 07/18/2015  . Rupture patellar tendon 07/29/2014  . Essential hypertension 06/04/2014  . Medication management 02/15/2014  . Prediabetes 09/24/2013  . Vitamin D deficiency 09/24/2013  . Hyperlipemia   . Cardiac pacemaker (06/2010) 08/26/2012  . S/P right TKA 02/07/2012  . FHx/o Colon Cancer 07/27/2011  . Aortic stenosis, moderate 07/09/2011  . S/P repair of patent ductus arteriosus 04/22/2011  . Atrial fibrillation (HCC) Chadsvasc 5 12/02/2010  . Renal Cell Cancer ( 2003) 11/17/2007  . 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  Her retching was better immediately.  She had issues getting her tube feeds back up to goal.  She started having a lot of bloating and pain when we tried to up feeds.  She also had minimal appetite.  She required some TNA.  Nutrition worked with her and we got her intake up.  She had some leukocytosis and a CT showed a few fluid collections.  These were tapped.  The RLQ collection grew enterococcus and the LUQ collection did not grow anything.    She was then able to get her intake between tube feeds and PO intake around 90-95% of projected needs.  She was discharged to home with home health PT and nursing.  She was doing much better than prior to admission.  She was placed back on home coumadin prior to d/c.    Consults: nutrition, interventional radiology  Significant Diagnostic Studies: labs: see epic, HCT going up, was 33.7 prior to d/c.  INR 1.82  Treatments: IV hydration and surgery: see above.  Perc drainage.  Antibiotics.    Discharge Exam: Blood pressure (!) 163/86, pulse 81, temperature 98.8 F (37.1 C), temperature source Oral, resp. rate 18, height 5' 1" (1.549 m), weight 67.4 kg (148 lb 9.4 oz), SpO2 99 %. General appearance: alert and no distress Resp: breathing comfortably GI: soft, non distended, approp distended.    Disposition: 01-Home or Self Care  Discharge Instructions    AMB Referral to [facility] Care Management    Complete by:  As directed    Reason for consult:  Frequent admissions in 6 months (5) - Post hospital monitoring   Diagnoses of:   Heart Failure Cancer with   Mets     Expected date of contact:  1-3 days (reserved for hospital discharges)   Please assign to community nurse for transition of care calls and assess for home visits. Daughter Brenda best contact. [phone] Questions please call:   Victoria Brewer, RN BSN CCM [facility] Liaison  [phone] business mobile phone Toll free office [phone]        Medication List    TAKE these medications   ALPRAZolam 0.5 MG tablet Commonly known as:  XANAX TAKE 1/2-1 TABLET BY MOUTH 3 TIMES A DAY AS NEEDED FOR ANXIETY What changed:  See the new instructions.   Cyanocobalamin 1000 MCG/ML Kit Inject 1,000 mcg into the skin as directed. What changed:  when to take this   diltiazem 180 MG 24 hr capsule Commonly known as:  CARDIZEM CD Take 1 capsule (180 mg total) by mouth daily.   feeding supplement (VITAL 1.5 CAL) Liqd Place 1,100 mLs into feeding tube daily. What changed:  how much to take  when to take this  additional instructions   furosemide 40 MG tablet Commonly known as:  LASIX TAKE 1 TABLET (40 MG TOTAL) BY MOUTH DAILY. What changed:  See the new instructions.   levothyroxine 100 MCG tablet Commonly known as:  SYNTHROID, LEVOTHROID TAKE 1/2 TABLET (50 MCG TOTAL) BY MOUTH EVERY DAY ON EMPTY STOMACH WITH A FULL GLASS OF WATER   metoprolol 100 MG tablet Commonly known as:  LOPRESSOR Take 1 tablet (100 mg total) by mouth 2 (two) times daily.   nitroGLYCERIN 0.4 MG SL tablet Commonly known as:  NITROSTAT Place 1 tablet (0.4 mg total) under the tongue every 5 (five) minutes as needed for chest pain (MAX 3 TABLETS).   ondansetron 8 MG disintegrating tablet Commonly known as:  ZOFRAN-ODT Take 1 tablet (8 mg total) by mouth every 8 (eight) hours as needed for nausea.   oxyCODONE 5 MG/5ML solution Commonly known as:  ROXICODONE Place 5 mg into feeding tube every 4 (four) hours as needed for severe pain.   pantoprazole 40 MG tablet Commonly known as:  PROTONIX TAKE 1 TABLET (40 MG TOTAL) BY MOUTH 2 (TWO) TIMES DAILY.   ranitidine 150 MG tablet Commonly known as:  ZANTAC Take 150 mg by mouth 2 (two) times daily.   traMADol 50 MG tablet Commonly known as:  ULTRAM Take 1 tablet (50 mg total) by mouth every 6 (six) hours as needed (pain). Reported on 10/14/2015   TYLENOL 167 MG/5ML Liqd Generic drug:  Acetaminophen Take  10 mLs by mouth daily as needed (for pain).   warfarin 2 MG tablet Commonly known as:  COUMADIN TAKE 1 TABLET DAILY OR AS DIRECTED What changed:  See the new instructions.      Follow-up Information    WATTS, JOHN, MD .   Specialty:  Interventional Radiology Why:  pt will hear from IR drain clinic for follow up appt for drains---call [phone] with questions/concerns Contact information: [address] [place] [address] [phone]        BYERLY,FAERA, MD. Schedule an appointment as soon as possible for a visit in 2 week(s).   Specialty:  General Surgery Contact information: [address] [place] [address] [phone]        [facility] .   Why:  home health agency Contact information: [address] [place] [address] [phone]           Signed: BYERLY,FAERA 07/19/2016, 4:43   PM

## 2016-07-20 DIAGNOSIS — Z85528 Personal history of other malignant neoplasm of kidney: Secondary | ICD-10-CM | POA: Diagnosis not present

## 2016-07-20 DIAGNOSIS — E785 Hyperlipidemia, unspecified: Secondary | ICD-10-CM | POA: Diagnosis not present

## 2016-07-20 DIAGNOSIS — K210 Gastro-esophageal reflux disease with esophagitis: Secondary | ICD-10-CM | POA: Diagnosis not present

## 2016-07-20 DIAGNOSIS — Z79891 Long term (current) use of opiate analgesic: Secondary | ICD-10-CM | POA: Diagnosis not present

## 2016-07-20 DIAGNOSIS — C169 Malignant neoplasm of stomach, unspecified: Secondary | ICD-10-CM | POA: Diagnosis not present

## 2016-07-20 DIAGNOSIS — I2510 Atherosclerotic heart disease of native coronary artery without angina pectoris: Secondary | ICD-10-CM | POA: Diagnosis not present

## 2016-07-20 DIAGNOSIS — Z434 Encounter for attention to other artificial openings of digestive tract: Secondary | ICD-10-CM | POA: Diagnosis not present

## 2016-07-20 DIAGNOSIS — Z5181 Encounter for therapeutic drug level monitoring: Secondary | ICD-10-CM | POA: Diagnosis not present

## 2016-07-20 DIAGNOSIS — Z96641 Presence of right artificial hip joint: Secondary | ICD-10-CM | POA: Diagnosis not present

## 2016-07-20 DIAGNOSIS — I69398 Other sequelae of cerebral infarction: Secondary | ICD-10-CM | POA: Diagnosis not present

## 2016-07-20 DIAGNOSIS — Z95810 Presence of automatic (implantable) cardiac defibrillator: Secondary | ICD-10-CM | POA: Diagnosis not present

## 2016-07-20 DIAGNOSIS — I130 Hypertensive heart and chronic kidney disease with heart failure and stage 1 through stage 4 chronic kidney disease, or unspecified chronic kidney disease: Secondary | ICD-10-CM | POA: Diagnosis not present

## 2016-07-20 DIAGNOSIS — Z96651 Presence of right artificial knee joint: Secondary | ICD-10-CM | POA: Diagnosis not present

## 2016-07-20 DIAGNOSIS — Z483 Aftercare following surgery for neoplasm: Secondary | ICD-10-CM | POA: Diagnosis not present

## 2016-07-20 DIAGNOSIS — N183 Chronic kidney disease, stage 3 (moderate): Secondary | ICD-10-CM | POA: Diagnosis not present

## 2016-07-20 DIAGNOSIS — Z7901 Long term (current) use of anticoagulants: Secondary | ICD-10-CM | POA: Diagnosis not present

## 2016-07-20 DIAGNOSIS — H548 Legal blindness, as defined in USA: Secondary | ICD-10-CM | POA: Diagnosis not present

## 2016-07-20 DIAGNOSIS — I509 Heart failure, unspecified: Secondary | ICD-10-CM | POA: Diagnosis not present

## 2016-07-20 DIAGNOSIS — D649 Anemia, unspecified: Secondary | ICD-10-CM | POA: Diagnosis not present

## 2016-07-20 DIAGNOSIS — I480 Paroxysmal atrial fibrillation: Secondary | ICD-10-CM | POA: Diagnosis not present

## 2016-07-22 DIAGNOSIS — H548 Legal blindness, as defined in USA: Secondary | ICD-10-CM | POA: Diagnosis not present

## 2016-07-22 DIAGNOSIS — Z95810 Presence of automatic (implantable) cardiac defibrillator: Secondary | ICD-10-CM | POA: Diagnosis not present

## 2016-07-22 DIAGNOSIS — Z96651 Presence of right artificial knee joint: Secondary | ICD-10-CM | POA: Diagnosis not present

## 2016-07-22 DIAGNOSIS — Z96641 Presence of right artificial hip joint: Secondary | ICD-10-CM | POA: Diagnosis not present

## 2016-07-22 DIAGNOSIS — I509 Heart failure, unspecified: Secondary | ICD-10-CM | POA: Diagnosis not present

## 2016-07-22 DIAGNOSIS — Z5181 Encounter for therapeutic drug level monitoring: Secondary | ICD-10-CM | POA: Diagnosis not present

## 2016-07-22 DIAGNOSIS — E785 Hyperlipidemia, unspecified: Secondary | ICD-10-CM | POA: Diagnosis not present

## 2016-07-22 DIAGNOSIS — N183 Chronic kidney disease, stage 3 (moderate): Secondary | ICD-10-CM | POA: Diagnosis not present

## 2016-07-22 DIAGNOSIS — K210 Gastro-esophageal reflux disease with esophagitis: Secondary | ICD-10-CM | POA: Diagnosis not present

## 2016-07-22 DIAGNOSIS — I480 Paroxysmal atrial fibrillation: Secondary | ICD-10-CM | POA: Diagnosis not present

## 2016-07-22 DIAGNOSIS — Z85528 Personal history of other malignant neoplasm of kidney: Secondary | ICD-10-CM | POA: Diagnosis not present

## 2016-07-22 DIAGNOSIS — C169 Malignant neoplasm of stomach, unspecified: Secondary | ICD-10-CM | POA: Diagnosis not present

## 2016-07-22 DIAGNOSIS — I2510 Atherosclerotic heart disease of native coronary artery without angina pectoris: Secondary | ICD-10-CM | POA: Diagnosis not present

## 2016-07-22 DIAGNOSIS — Z434 Encounter for attention to other artificial openings of digestive tract: Secondary | ICD-10-CM | POA: Diagnosis not present

## 2016-07-22 DIAGNOSIS — I130 Hypertensive heart and chronic kidney disease with heart failure and stage 1 through stage 4 chronic kidney disease, or unspecified chronic kidney disease: Secondary | ICD-10-CM | POA: Diagnosis not present

## 2016-07-22 DIAGNOSIS — I69398 Other sequelae of cerebral infarction: Secondary | ICD-10-CM | POA: Diagnosis not present

## 2016-07-22 DIAGNOSIS — Z483 Aftercare following surgery for neoplasm: Secondary | ICD-10-CM | POA: Diagnosis not present

## 2016-07-22 DIAGNOSIS — D649 Anemia, unspecified: Secondary | ICD-10-CM | POA: Diagnosis not present

## 2016-07-22 DIAGNOSIS — Z79891 Long term (current) use of opiate analgesic: Secondary | ICD-10-CM | POA: Diagnosis not present

## 2016-07-22 DIAGNOSIS — Z7901 Long term (current) use of anticoagulants: Secondary | ICD-10-CM | POA: Diagnosis not present

## 2016-07-23 ENCOUNTER — Ambulatory Visit (INDEPENDENT_AMBULATORY_CARE_PROVIDER_SITE_OTHER): Payer: Medicare Other | Admitting: Cardiology

## 2016-07-23 DIAGNOSIS — Z96651 Presence of right artificial knee joint: Secondary | ICD-10-CM | POA: Diagnosis not present

## 2016-07-23 DIAGNOSIS — Z7901 Long term (current) use of anticoagulants: Secondary | ICD-10-CM | POA: Diagnosis not present

## 2016-07-23 DIAGNOSIS — I480 Paroxysmal atrial fibrillation: Secondary | ICD-10-CM | POA: Diagnosis not present

## 2016-07-23 DIAGNOSIS — I69398 Other sequelae of cerebral infarction: Secondary | ICD-10-CM | POA: Diagnosis not present

## 2016-07-23 DIAGNOSIS — Z96641 Presence of right artificial hip joint: Secondary | ICD-10-CM | POA: Diagnosis not present

## 2016-07-23 DIAGNOSIS — C169 Malignant neoplasm of stomach, unspecified: Secondary | ICD-10-CM | POA: Diagnosis not present

## 2016-07-23 DIAGNOSIS — E785 Hyperlipidemia, unspecified: Secondary | ICD-10-CM | POA: Diagnosis not present

## 2016-07-23 DIAGNOSIS — Z5181 Encounter for therapeutic drug level monitoring: Secondary | ICD-10-CM | POA: Diagnosis not present

## 2016-07-23 DIAGNOSIS — I130 Hypertensive heart and chronic kidney disease with heart failure and stage 1 through stage 4 chronic kidney disease, or unspecified chronic kidney disease: Secondary | ICD-10-CM | POA: Diagnosis not present

## 2016-07-23 DIAGNOSIS — Z95810 Presence of automatic (implantable) cardiac defibrillator: Secondary | ICD-10-CM | POA: Diagnosis not present

## 2016-07-23 DIAGNOSIS — D649 Anemia, unspecified: Secondary | ICD-10-CM | POA: Diagnosis not present

## 2016-07-23 DIAGNOSIS — H548 Legal blindness, as defined in USA: Secondary | ICD-10-CM | POA: Diagnosis not present

## 2016-07-23 DIAGNOSIS — Z483 Aftercare following surgery for neoplasm: Secondary | ICD-10-CM | POA: Diagnosis not present

## 2016-07-23 DIAGNOSIS — Z85528 Personal history of other malignant neoplasm of kidney: Secondary | ICD-10-CM | POA: Diagnosis not present

## 2016-07-23 DIAGNOSIS — Z434 Encounter for attention to other artificial openings of digestive tract: Secondary | ICD-10-CM | POA: Diagnosis not present

## 2016-07-23 DIAGNOSIS — Z79891 Long term (current) use of opiate analgesic: Secondary | ICD-10-CM | POA: Diagnosis not present

## 2016-07-23 DIAGNOSIS — I509 Heart failure, unspecified: Secondary | ICD-10-CM | POA: Diagnosis not present

## 2016-07-23 DIAGNOSIS — N183 Chronic kidney disease, stage 3 (moderate): Secondary | ICD-10-CM | POA: Diagnosis not present

## 2016-07-23 DIAGNOSIS — I2510 Atherosclerotic heart disease of native coronary artery without angina pectoris: Secondary | ICD-10-CM | POA: Diagnosis not present

## 2016-07-23 DIAGNOSIS — K210 Gastro-esophageal reflux disease with esophagitis: Secondary | ICD-10-CM | POA: Diagnosis not present

## 2016-07-23 LAB — POCT INR: INR: 2.7

## 2016-07-26 ENCOUNTER — Ambulatory Visit: Payer: Self-pay | Admitting: Physician Assistant

## 2016-07-26 DIAGNOSIS — C169 Malignant neoplasm of stomach, unspecified: Secondary | ICD-10-CM | POA: Diagnosis not present

## 2016-07-27 ENCOUNTER — Encounter: Payer: Self-pay | Admitting: Radiology

## 2016-07-27 DIAGNOSIS — E785 Hyperlipidemia, unspecified: Secondary | ICD-10-CM | POA: Diagnosis not present

## 2016-07-27 DIAGNOSIS — I480 Paroxysmal atrial fibrillation: Secondary | ICD-10-CM | POA: Diagnosis not present

## 2016-07-27 DIAGNOSIS — Z85528 Personal history of other malignant neoplasm of kidney: Secondary | ICD-10-CM | POA: Diagnosis not present

## 2016-07-27 DIAGNOSIS — D649 Anemia, unspecified: Secondary | ICD-10-CM | POA: Diagnosis not present

## 2016-07-27 DIAGNOSIS — Z483 Aftercare following surgery for neoplasm: Secondary | ICD-10-CM | POA: Diagnosis not present

## 2016-07-27 DIAGNOSIS — H548 Legal blindness, as defined in USA: Secondary | ICD-10-CM | POA: Diagnosis not present

## 2016-07-27 DIAGNOSIS — Z95810 Presence of automatic (implantable) cardiac defibrillator: Secondary | ICD-10-CM | POA: Diagnosis not present

## 2016-07-27 DIAGNOSIS — I509 Heart failure, unspecified: Secondary | ICD-10-CM | POA: Diagnosis not present

## 2016-07-27 DIAGNOSIS — Z79891 Long term (current) use of opiate analgesic: Secondary | ICD-10-CM | POA: Diagnosis not present

## 2016-07-27 DIAGNOSIS — Z434 Encounter for attention to other artificial openings of digestive tract: Secondary | ICD-10-CM | POA: Diagnosis not present

## 2016-07-27 DIAGNOSIS — I2510 Atherosclerotic heart disease of native coronary artery without angina pectoris: Secondary | ICD-10-CM | POA: Diagnosis not present

## 2016-07-27 DIAGNOSIS — N183 Chronic kidney disease, stage 3 (moderate): Secondary | ICD-10-CM | POA: Diagnosis not present

## 2016-07-27 DIAGNOSIS — Z96651 Presence of right artificial knee joint: Secondary | ICD-10-CM | POA: Diagnosis not present

## 2016-07-27 DIAGNOSIS — I130 Hypertensive heart and chronic kidney disease with heart failure and stage 1 through stage 4 chronic kidney disease, or unspecified chronic kidney disease: Secondary | ICD-10-CM | POA: Diagnosis not present

## 2016-07-27 DIAGNOSIS — Z96641 Presence of right artificial hip joint: Secondary | ICD-10-CM | POA: Diagnosis not present

## 2016-07-27 DIAGNOSIS — K210 Gastro-esophageal reflux disease with esophagitis: Secondary | ICD-10-CM | POA: Diagnosis not present

## 2016-07-27 DIAGNOSIS — C169 Malignant neoplasm of stomach, unspecified: Secondary | ICD-10-CM | POA: Diagnosis not present

## 2016-07-27 DIAGNOSIS — I69398 Other sequelae of cerebral infarction: Secondary | ICD-10-CM | POA: Diagnosis not present

## 2016-07-27 DIAGNOSIS — Z5181 Encounter for therapeutic drug level monitoring: Secondary | ICD-10-CM | POA: Diagnosis not present

## 2016-07-27 DIAGNOSIS — Z7901 Long term (current) use of anticoagulants: Secondary | ICD-10-CM | POA: Diagnosis not present

## 2016-08-03 ENCOUNTER — Ambulatory Visit (INDEPENDENT_AMBULATORY_CARE_PROVIDER_SITE_OTHER): Payer: Medicare Other

## 2016-08-03 DIAGNOSIS — Z434 Encounter for attention to other artificial openings of digestive tract: Secondary | ICD-10-CM | POA: Diagnosis not present

## 2016-08-03 DIAGNOSIS — Z5181 Encounter for therapeutic drug level monitoring: Secondary | ICD-10-CM | POA: Diagnosis not present

## 2016-08-03 DIAGNOSIS — I480 Paroxysmal atrial fibrillation: Secondary | ICD-10-CM | POA: Diagnosis not present

## 2016-08-03 DIAGNOSIS — N183 Chronic kidney disease, stage 3 (moderate): Secondary | ICD-10-CM | POA: Diagnosis not present

## 2016-08-03 DIAGNOSIS — Z85528 Personal history of other malignant neoplasm of kidney: Secondary | ICD-10-CM | POA: Diagnosis not present

## 2016-08-03 DIAGNOSIS — I4891 Unspecified atrial fibrillation: Secondary | ICD-10-CM

## 2016-08-03 DIAGNOSIS — D649 Anemia, unspecified: Secondary | ICD-10-CM | POA: Diagnosis not present

## 2016-08-03 DIAGNOSIS — I509 Heart failure, unspecified: Secondary | ICD-10-CM | POA: Diagnosis not present

## 2016-08-03 DIAGNOSIS — Z79891 Long term (current) use of opiate analgesic: Secondary | ICD-10-CM | POA: Diagnosis not present

## 2016-08-03 DIAGNOSIS — H548 Legal blindness, as defined in USA: Secondary | ICD-10-CM | POA: Diagnosis not present

## 2016-08-03 DIAGNOSIS — K210 Gastro-esophageal reflux disease with esophagitis: Secondary | ICD-10-CM | POA: Diagnosis not present

## 2016-08-03 DIAGNOSIS — I130 Hypertensive heart and chronic kidney disease with heart failure and stage 1 through stage 4 chronic kidney disease, or unspecified chronic kidney disease: Secondary | ICD-10-CM | POA: Diagnosis not present

## 2016-08-03 DIAGNOSIS — Z7901 Long term (current) use of anticoagulants: Secondary | ICD-10-CM | POA: Diagnosis not present

## 2016-08-03 DIAGNOSIS — I69398 Other sequelae of cerebral infarction: Secondary | ICD-10-CM | POA: Diagnosis not present

## 2016-08-03 DIAGNOSIS — Z483 Aftercare following surgery for neoplasm: Secondary | ICD-10-CM | POA: Diagnosis not present

## 2016-08-03 DIAGNOSIS — I2510 Atherosclerotic heart disease of native coronary artery without angina pectoris: Secondary | ICD-10-CM | POA: Diagnosis not present

## 2016-08-03 DIAGNOSIS — C169 Malignant neoplasm of stomach, unspecified: Secondary | ICD-10-CM | POA: Diagnosis not present

## 2016-08-03 DIAGNOSIS — E785 Hyperlipidemia, unspecified: Secondary | ICD-10-CM | POA: Diagnosis not present

## 2016-08-03 DIAGNOSIS — Z96641 Presence of right artificial hip joint: Secondary | ICD-10-CM | POA: Diagnosis not present

## 2016-08-03 DIAGNOSIS — Z95810 Presence of automatic (implantable) cardiac defibrillator: Secondary | ICD-10-CM | POA: Diagnosis not present

## 2016-08-03 DIAGNOSIS — Z96651 Presence of right artificial knee joint: Secondary | ICD-10-CM | POA: Diagnosis not present

## 2016-08-03 LAB — POCT INR: INR: 3.8

## 2016-08-05 DIAGNOSIS — Z79891 Long term (current) use of opiate analgesic: Secondary | ICD-10-CM | POA: Diagnosis not present

## 2016-08-05 DIAGNOSIS — I69398 Other sequelae of cerebral infarction: Secondary | ICD-10-CM | POA: Diagnosis not present

## 2016-08-05 DIAGNOSIS — Z85528 Personal history of other malignant neoplasm of kidney: Secondary | ICD-10-CM | POA: Diagnosis not present

## 2016-08-05 DIAGNOSIS — I130 Hypertensive heart and chronic kidney disease with heart failure and stage 1 through stage 4 chronic kidney disease, or unspecified chronic kidney disease: Secondary | ICD-10-CM | POA: Diagnosis not present

## 2016-08-05 DIAGNOSIS — Z434 Encounter for attention to other artificial openings of digestive tract: Secondary | ICD-10-CM | POA: Diagnosis not present

## 2016-08-05 DIAGNOSIS — D649 Anemia, unspecified: Secondary | ICD-10-CM | POA: Diagnosis not present

## 2016-08-05 DIAGNOSIS — Z7901 Long term (current) use of anticoagulants: Secondary | ICD-10-CM | POA: Diagnosis not present

## 2016-08-05 DIAGNOSIS — I480 Paroxysmal atrial fibrillation: Secondary | ICD-10-CM | POA: Diagnosis not present

## 2016-08-05 DIAGNOSIS — Z483 Aftercare following surgery for neoplasm: Secondary | ICD-10-CM | POA: Diagnosis not present

## 2016-08-05 DIAGNOSIS — Z96651 Presence of right artificial knee joint: Secondary | ICD-10-CM | POA: Diagnosis not present

## 2016-08-05 DIAGNOSIS — K210 Gastro-esophageal reflux disease with esophagitis: Secondary | ICD-10-CM | POA: Diagnosis not present

## 2016-08-05 DIAGNOSIS — E785 Hyperlipidemia, unspecified: Secondary | ICD-10-CM | POA: Diagnosis not present

## 2016-08-05 DIAGNOSIS — Z5181 Encounter for therapeutic drug level monitoring: Secondary | ICD-10-CM | POA: Diagnosis not present

## 2016-08-05 DIAGNOSIS — N183 Chronic kidney disease, stage 3 (moderate): Secondary | ICD-10-CM | POA: Diagnosis not present

## 2016-08-05 DIAGNOSIS — I509 Heart failure, unspecified: Secondary | ICD-10-CM | POA: Diagnosis not present

## 2016-08-05 DIAGNOSIS — Z96641 Presence of right artificial hip joint: Secondary | ICD-10-CM | POA: Diagnosis not present

## 2016-08-05 DIAGNOSIS — Z95810 Presence of automatic (implantable) cardiac defibrillator: Secondary | ICD-10-CM | POA: Diagnosis not present

## 2016-08-05 DIAGNOSIS — C169 Malignant neoplasm of stomach, unspecified: Secondary | ICD-10-CM | POA: Diagnosis not present

## 2016-08-05 DIAGNOSIS — H548 Legal blindness, as defined in USA: Secondary | ICD-10-CM | POA: Diagnosis not present

## 2016-08-05 DIAGNOSIS — I2510 Atherosclerotic heart disease of native coronary artery without angina pectoris: Secondary | ICD-10-CM | POA: Diagnosis not present

## 2016-08-10 DIAGNOSIS — I480 Paroxysmal atrial fibrillation: Secondary | ICD-10-CM | POA: Diagnosis not present

## 2016-08-10 DIAGNOSIS — H548 Legal blindness, as defined in USA: Secondary | ICD-10-CM | POA: Diagnosis not present

## 2016-08-10 DIAGNOSIS — Z95810 Presence of automatic (implantable) cardiac defibrillator: Secondary | ICD-10-CM | POA: Diagnosis not present

## 2016-08-10 DIAGNOSIS — Z5181 Encounter for therapeutic drug level monitoring: Secondary | ICD-10-CM | POA: Diagnosis not present

## 2016-08-10 DIAGNOSIS — Z96651 Presence of right artificial knee joint: Secondary | ICD-10-CM | POA: Diagnosis not present

## 2016-08-10 DIAGNOSIS — C169 Malignant neoplasm of stomach, unspecified: Secondary | ICD-10-CM | POA: Diagnosis not present

## 2016-08-10 DIAGNOSIS — Z85528 Personal history of other malignant neoplasm of kidney: Secondary | ICD-10-CM | POA: Diagnosis not present

## 2016-08-10 DIAGNOSIS — I2510 Atherosclerotic heart disease of native coronary artery without angina pectoris: Secondary | ICD-10-CM | POA: Diagnosis not present

## 2016-08-10 DIAGNOSIS — N183 Chronic kidney disease, stage 3 (moderate): Secondary | ICD-10-CM | POA: Diagnosis not present

## 2016-08-10 DIAGNOSIS — I69398 Other sequelae of cerebral infarction: Secondary | ICD-10-CM | POA: Diagnosis not present

## 2016-08-10 DIAGNOSIS — E785 Hyperlipidemia, unspecified: Secondary | ICD-10-CM | POA: Diagnosis not present

## 2016-08-10 DIAGNOSIS — I130 Hypertensive heart and chronic kidney disease with heart failure and stage 1 through stage 4 chronic kidney disease, or unspecified chronic kidney disease: Secondary | ICD-10-CM | POA: Diagnosis not present

## 2016-08-10 DIAGNOSIS — K210 Gastro-esophageal reflux disease with esophagitis: Secondary | ICD-10-CM | POA: Diagnosis not present

## 2016-08-10 DIAGNOSIS — Z434 Encounter for attention to other artificial openings of digestive tract: Secondary | ICD-10-CM | POA: Diagnosis not present

## 2016-08-10 DIAGNOSIS — Z7901 Long term (current) use of anticoagulants: Secondary | ICD-10-CM | POA: Diagnosis not present

## 2016-08-10 DIAGNOSIS — D649 Anemia, unspecified: Secondary | ICD-10-CM | POA: Diagnosis not present

## 2016-08-10 DIAGNOSIS — Z79891 Long term (current) use of opiate analgesic: Secondary | ICD-10-CM | POA: Diagnosis not present

## 2016-08-10 DIAGNOSIS — I509 Heart failure, unspecified: Secondary | ICD-10-CM | POA: Diagnosis not present

## 2016-08-10 DIAGNOSIS — Z483 Aftercare following surgery for neoplasm: Secondary | ICD-10-CM | POA: Diagnosis not present

## 2016-08-10 DIAGNOSIS — Z96641 Presence of right artificial hip joint: Secondary | ICD-10-CM | POA: Diagnosis not present

## 2016-08-11 ENCOUNTER — Ambulatory Visit (INDEPENDENT_AMBULATORY_CARE_PROVIDER_SITE_OTHER): Payer: Medicare Other | Admitting: Physician Assistant

## 2016-08-11 ENCOUNTER — Ambulatory Visit (INDEPENDENT_AMBULATORY_CARE_PROVIDER_SITE_OTHER): Payer: Medicare Other | Admitting: Cardiovascular Disease

## 2016-08-11 ENCOUNTER — Encounter: Payer: Self-pay | Admitting: Physician Assistant

## 2016-08-11 VITALS — BP 132/84 | HR 96 | Temp 97.3°F | Resp 14 | Ht 61.0 in | Wt 136.8 lb

## 2016-08-11 DIAGNOSIS — Z789 Other specified health status: Secondary | ICD-10-CM

## 2016-08-11 DIAGNOSIS — R6889 Other general symptoms and signs: Secondary | ICD-10-CM | POA: Diagnosis not present

## 2016-08-11 DIAGNOSIS — E038 Other specified hypothyroidism: Secondary | ICD-10-CM | POA: Diagnosis not present

## 2016-08-11 DIAGNOSIS — H548 Legal blindness, as defined in USA: Secondary | ICD-10-CM | POA: Diagnosis not present

## 2016-08-11 DIAGNOSIS — Z0001 Encounter for general adult medical examination with abnormal findings: Secondary | ICD-10-CM

## 2016-08-11 DIAGNOSIS — I10 Essential (primary) hypertension: Secondary | ICD-10-CM | POA: Diagnosis not present

## 2016-08-11 DIAGNOSIS — Z8774 Personal history of (corrected) congenital malformations of heart and circulatory system: Secondary | ICD-10-CM

## 2016-08-11 DIAGNOSIS — K2100 Gastro-esophageal reflux disease with esophagitis, without bleeding: Secondary | ICD-10-CM

## 2016-08-11 DIAGNOSIS — K911 Postgastric surgery syndromes: Secondary | ICD-10-CM | POA: Diagnosis not present

## 2016-08-11 DIAGNOSIS — D649 Anemia, unspecified: Secondary | ICD-10-CM | POA: Diagnosis not present

## 2016-08-11 DIAGNOSIS — E559 Vitamin D deficiency, unspecified: Secondary | ICD-10-CM | POA: Diagnosis not present

## 2016-08-11 DIAGNOSIS — I509 Heart failure, unspecified: Secondary | ICD-10-CM | POA: Diagnosis not present

## 2016-08-11 DIAGNOSIS — Z950 Presence of cardiac pacemaker: Secondary | ICD-10-CM

## 2016-08-11 DIAGNOSIS — S60411D Abrasion of left index finger, subsequent encounter: Secondary | ICD-10-CM

## 2016-08-11 DIAGNOSIS — N183 Chronic kidney disease, stage 3 unspecified: Secondary | ICD-10-CM

## 2016-08-11 DIAGNOSIS — E785 Hyperlipidemia, unspecified: Secondary | ICD-10-CM

## 2016-08-11 DIAGNOSIS — I350 Nonrheumatic aortic (valve) stenosis: Secondary | ICD-10-CM | POA: Diagnosis not present

## 2016-08-11 DIAGNOSIS — I130 Hypertensive heart and chronic kidney disease with heart failure and stage 1 through stage 4 chronic kidney disease, or unspecified chronic kidney disease: Secondary | ICD-10-CM | POA: Diagnosis not present

## 2016-08-11 DIAGNOSIS — E538 Deficiency of other specified B group vitamins: Secondary | ICD-10-CM

## 2016-08-11 DIAGNOSIS — E440 Moderate protein-calorie malnutrition: Secondary | ICD-10-CM

## 2016-08-11 DIAGNOSIS — Z79899 Other long term (current) drug therapy: Secondary | ICD-10-CM

## 2016-08-11 DIAGNOSIS — Z85528 Personal history of other malignant neoplasm of kidney: Secondary | ICD-10-CM | POA: Diagnosis not present

## 2016-08-11 DIAGNOSIS — I4891 Unspecified atrial fibrillation: Secondary | ICD-10-CM

## 2016-08-11 DIAGNOSIS — Z5181 Encounter for therapeutic drug level monitoring: Secondary | ICD-10-CM | POA: Diagnosis not present

## 2016-08-11 DIAGNOSIS — C169 Malignant neoplasm of stomach, unspecified: Secondary | ICD-10-CM

## 2016-08-11 DIAGNOSIS — Z7901 Long term (current) use of anticoagulants: Secondary | ICD-10-CM

## 2016-08-11 DIAGNOSIS — C649 Malignant neoplasm of unspecified kidney, except renal pelvis: Secondary | ICD-10-CM

## 2016-08-11 DIAGNOSIS — D500 Iron deficiency anemia secondary to blood loss (chronic): Secondary | ICD-10-CM

## 2016-08-11 DIAGNOSIS — Z79891 Long term (current) use of opiate analgesic: Secondary | ICD-10-CM | POA: Diagnosis not present

## 2016-08-11 DIAGNOSIS — G64 Other disorders of peripheral nervous system: Secondary | ICD-10-CM | POA: Diagnosis not present

## 2016-08-11 DIAGNOSIS — I481 Persistent atrial fibrillation: Secondary | ICD-10-CM

## 2016-08-11 DIAGNOSIS — I480 Paroxysmal atrial fibrillation: Secondary | ICD-10-CM | POA: Diagnosis not present

## 2016-08-11 DIAGNOSIS — I2510 Atherosclerotic heart disease of native coronary artery without angina pectoris: Secondary | ICD-10-CM | POA: Diagnosis not present

## 2016-08-11 DIAGNOSIS — Z9889 Other specified postprocedural states: Secondary | ICD-10-CM

## 2016-08-11 DIAGNOSIS — Z95810 Presence of automatic (implantable) cardiac defibrillator: Secondary | ICD-10-CM | POA: Diagnosis not present

## 2016-08-11 DIAGNOSIS — G629 Polyneuropathy, unspecified: Secondary | ICD-10-CM

## 2016-08-11 DIAGNOSIS — I4819 Other persistent atrial fibrillation: Secondary | ICD-10-CM

## 2016-08-11 DIAGNOSIS — Z96641 Presence of right artificial hip joint: Secondary | ICD-10-CM | POA: Diagnosis not present

## 2016-08-11 DIAGNOSIS — Z483 Aftercare following surgery for neoplasm: Secondary | ICD-10-CM | POA: Diagnosis not present

## 2016-08-11 DIAGNOSIS — Z800 Family history of malignant neoplasm of digestive organs: Secondary | ICD-10-CM

## 2016-08-11 DIAGNOSIS — Z0000 Encounter for general adult medical examination without abnormal findings: Secondary | ICD-10-CM

## 2016-08-11 DIAGNOSIS — I69398 Other sequelae of cerebral infarction: Secondary | ICD-10-CM | POA: Diagnosis not present

## 2016-08-11 DIAGNOSIS — Z434 Encounter for attention to other artificial openings of digestive tract: Secondary | ICD-10-CM | POA: Diagnosis not present

## 2016-08-11 DIAGNOSIS — R7303 Prediabetes: Secondary | ICD-10-CM

## 2016-08-11 DIAGNOSIS — K2210 Ulcer of esophagus without bleeding: Secondary | ICD-10-CM | POA: Diagnosis not present

## 2016-08-11 DIAGNOSIS — Z96651 Presence of right artificial knee joint: Secondary | ICD-10-CM | POA: Diagnosis not present

## 2016-08-11 LAB — IRON AND TIBC
%SAT: 20 % (ref 11–50)
Iron: 71 ug/dL (ref 45–160)
TIBC: 360 ug/dL (ref 250–450)
UIBC: 289 ug/dL (ref 125–400)

## 2016-08-11 LAB — LIPID PANEL
CHOL/HDL RATIO: 5 ratio — AB (ref <5.0)
Cholesterol: 141 mg/dL (ref <200)
HDL: 28 mg/dL — AB (ref >50)
LDL Cholesterol: 74 mg/dL (ref <100)
Triglycerides: 197 mg/dL — ABNORMAL HIGH (ref <150)
VLDL: 39 mg/dL — AB (ref <30)

## 2016-08-11 LAB — CBC WITH DIFFERENTIAL/PLATELET
BASOS PCT: 0 %
Basophils Absolute: 0 cells/uL (ref 0–200)
EOS PCT: 4 %
Eosinophils Absolute: 264 cells/uL (ref 15–500)
HCT: 41.9 % (ref 35.0–45.0)
HEMOGLOBIN: 13.8 g/dL (ref 11.7–15.5)
LYMPHS ABS: 2376 {cells}/uL (ref 850–3900)
Lymphocytes Relative: 36 %
MCH: 30.6 pg (ref 27.0–33.0)
MCHC: 32.9 g/dL (ref 32.0–36.0)
MCV: 92.9 fL (ref 80.0–100.0)
MONOS PCT: 9 %
MPV: 9.8 fL (ref 7.5–12.5)
Monocytes Absolute: 594 cells/uL (ref 200–950)
NEUTROS ABS: 3366 {cells}/uL (ref 1500–7800)
Neutrophils Relative %: 51 %
PLATELETS: 223 10*3/uL (ref 140–400)
RBC: 4.51 MIL/uL (ref 3.80–5.10)
RDW: 15.1 % — ABNORMAL HIGH (ref 11.0–15.0)
WBC: 6.6 10*3/uL (ref 3.8–10.8)

## 2016-08-11 LAB — HEPATIC FUNCTION PANEL
ALK PHOS: 101 U/L (ref 33–130)
ALT: 12 U/L (ref 6–29)
AST: 21 U/L (ref 10–35)
Albumin: 4.1 g/dL (ref 3.6–5.1)
BILIRUBIN DIRECT: 0.1 mg/dL (ref <=0.2)
BILIRUBIN INDIRECT: 0.3 mg/dL (ref 0.2–1.2)
BILIRUBIN TOTAL: 0.4 mg/dL (ref 0.2–1.2)
Total Protein: 7.3 g/dL (ref 6.1–8.1)

## 2016-08-11 LAB — BASIC METABOLIC PANEL WITH GFR
BUN: 13 mg/dL (ref 7–25)
CHLORIDE: 110 mmol/L (ref 98–110)
CO2: 18 mmol/L — ABNORMAL LOW (ref 20–31)
CREATININE: 0.9 mg/dL — AB (ref 0.60–0.88)
Calcium: 9.2 mg/dL (ref 8.6–10.4)
GFR, EST NON AFRICAN AMERICAN: 61 mL/min (ref >=60)
GFR, Est African American: 70 mL/min (ref >=60)
Glucose, Bld: 87 mg/dL (ref 65–99)
POTASSIUM: 3.6 mmol/L (ref 3.5–5.3)
SODIUM: 143 mmol/L (ref 135–146)

## 2016-08-11 LAB — FERRITIN: Ferritin: 81 ng/mL (ref 20–288)

## 2016-08-11 LAB — POCT INR: INR: 4.6

## 2016-08-11 LAB — TSH: TSH: 0.69 m[IU]/L

## 2016-08-11 LAB — VITAMIN B12: VITAMIN B 12: 1256 pg/mL — AB (ref 200–1100)

## 2016-08-11 MED ORDER — TRIAMCINOLONE ACETONIDE 0.5 % EX CREA: 1.0000 "application " | TOPICAL_CREAM | Freq: Two times a day (BID) | CUTANEOUS | 2 refills | Status: DC

## 2016-08-11 NOTE — Progress Notes (Signed)
MEDICARE ANNUAL WELLNESS VISIT AND 3 month follow up  Assessment:    Essential hypertension - continue medications, DASH diet, exercise and monitor at home. Call if greater than 130/80.  -     CBC with Differential/Platelet -     BASIC METABOLIC PANEL WITH GFR -     Hepatic function panel -     TSH  Persistent atrial fibrillation (HCC) Continue cardio follow up  Aortic stenosis, moderate Continue cardio follow up  Adenocarcinoma of stomach (HCC) Continue follow up Dr. Laurene Footman Doing very well  Gastroesophageal reflux disease with esophagitis Continue PPI/H2 blocker, diet discussed  Post gastrectomy syndrome Discussed monitoring for deficiencies and needing to eat small frequent meals  Erosive esophagitis Continue PPI/H2 blocker, diet discussed  Other specified hypothyroidism Hypothyroidism-check TSH level, continue medications the same, reminded to take on an empty stomach 30-39mns before food.  -     TSH  Peripheral nerve disease [place]  Malignant neoplasm of kidney excluding renal pelvis, unspecified laterality (HCC) Check BMP, continue to monitor  CKD (chronic kidney disease), stage III Increase fluids, avoid NSAIDS, monitor sugars, will monitor -     BASIC METABOLIC PANEL WITH GFR  No blood products -     CBC with Differential/Platelet  S/P repair of patent ductus arteriosus Continue cardio follow up  FHx/o Colon Cancer Continue to follow up with GI  Cardiac pacemaker (06/2010) Continue cardio follow up  Hyperlipidemia, unspecified hyperlipidemia type -continue medications, check lipids, decrease fatty foods, increase activity.  -     Lipid panel  Prediabetes Resolved with weight loss  Vitamin D deficiency -     VITAMIN D 25 Hydroxy (Vit-D Deficiency, Fractures)  Medication management -     Magnesium  Vitamin B12 deficiency -     Vitamin B12  Iron deficiency anemia due to chronic blood loss -     Iron and TIBC -      Ferritin  Protein-calorie malnutrition, moderate (HCC) Discussed diet/drinking ensure, continue to monitor weight daily -     Hepatic function panel  Long term current use of anticoagulant therapy Continue cardio follow up  Encounter for Medicare annual wellness exam  Abrasion of left index finger, subsequent encounter -     triamcinolone cream (KENALOG) 0.5 %; Apply 1 application topically 2 (two) times daily - if not better will refer to derm    Over 40 minutes of exam, counseling, chart review and critical decision making was performed Future Appointments Date Time Provider DTen Broeck 10/28/2016 9:00 AM WUnk Pinto MD [facility] None     Plan:   During the course of the visit the patient was educated and counseled about appropriate screening and preventive services including:    Pneumococcal vaccine   Prevnar 13  Influenza vaccine  Td vaccine  Screening electrocardiogram  Bone densitometry screening  Colorectal cancer screening  Diabetes screening  Glaucoma screening  Nutrition counseling   Advanced directives: requested   Subjective:  Valerie BURBAGEis a 82y.o. female who presents for Medicare Annual Wellness Visit and complete physical.    She had recent partial gastrectomy in May of this year for gastric adenocarcinoma, following with Dr. BBarry Dienesand Dr. GCarlean Purl she had complication with an abdominal abscess, she was admitted 09/28 and discharged 11/13.  Has dry skin on her left finger, has been rubbing it.  Her blood pressure has been controlled at home, today their BP is BP: 132/84  She does not workout. She denies chest pain,  shortness of breath, dizziness. She has Afib/PPM and AS s/p pacemaker, follows with Dr. Angelena Form, coumadin recently supratheraputic and was changed today, not taking any today or tomorrow and taking 2 mg daily and recheck Tuesday.   Lab Results  Component Value Date   INR 4.6 08/11/2016   INR 3.8 08/03/2016    INR 2.7 07/23/2016   She has a history of renal and uterine cancer.   She is not on cholesterol medication and denies myalgias. Her cholesterol is not at goal. The cholesterol last visit was:   Lab Results  Component Value Date   CHOL 183 10/09/2015   HDL 29 (L) 10/09/2015   LDLCALC 105 10/09/2015   TRIG 93 03/01/2016   CHOLHDL 6.3 (H) 10/09/2015   Last A1C in the office was:  Lab Results  Component Value Date   HGBA1C 5.3 04/21/2016   Patient is on Vitamin D supplement.   Lab Results  Component Value Date   VD25OH 64 04/21/2016     She is on thyroid medication. Her medication was not changed last visit.   Lab Results  Component Value Date   TSH 1.53 04/21/2016  .  BMI is Body mass index is 25.85 kg/m.. Wt Readings from Last 3 Encounters:  08/11/16 136 lb 12.8 oz (62.1 kg)  06/04/16 148 lb 9.4 oz (67.4 kg)  05/22/16 146 lb 12.8 oz (66.6 kg)   Medication Review: Current Outpatient Prescriptions on File Prior to Visit  Medication Sig Dispense Refill  . Acetaminophen (TYLENOL) 167 MG/5ML LIQD Take 10 mLs by mouth daily as needed (for pain).    [name] ALPRAZolam (XANAX) 0.5 MG tablet TAKE 1/2-1 TABLET BY MOUTH 3 TIMES A DAY AS NEEDED FOR ANXIETY (Patient taking differently: TAKE 1/2-1 TABLET (0.25 - 0.5 mg) BY MOUTH 3 TIMES A DAY AS NEEDED FOR ANXIETY) 90 tablet 0  . Cyanocobalamin 1000 MCG/ML KIT Inject 1,000 mcg into the skin as directed. (Patient taking differently: Inject 1,000 mcg into the skin every 30 (thirty) days. ) 6 kit 1  . diltiazem (CARDIZEM CD) 180 MG 24 hr capsule Take 1 capsule (180 mg total) by mouth daily. 90 capsule 1  . furosemide (LASIX) 40 MG tablet TAKE 1 TABLET (40 MG TOTAL) BY MOUTH DAILY. (Patient taking differently: TAKE 1/2 TABLET (20 MG TOTAL) BY MOUTH DAILY.) 30 tablet 0  . gabapentin (NEURONTIN) 300 MG capsule Take 1 capsule (300 mg total) by mouth 3 (three) times daily. 270 capsule 1  . levothyroxine (SYNTHROID, LEVOTHROID) 100 MCG tablet TAKE 1/2  TABLET (50 MCG TOTAL) BY MOUTH EVERY DAY ON EMPTY STOMACH WITH A FULL GLASS OF WATER 90 tablet 1  . metoprolol (LOPRESSOR) 100 MG tablet Take 1 tablet (100 mg total) by mouth 2 (two) times daily. 180 tablet 1  . nitroGLYCERIN (NITROSTAT) 0.4 MG SL tablet Place 1 tablet (0.4 mg total) under the tongue every 5 (five) minutes as needed for chest pain (MAX 3 TABLETS). 25 tablet prn  . ondansetron (ZOFRAN-ODT) 8 MG disintegrating tablet Take 1 tablet (8 mg total) by mouth every 8 (eight) hours as needed for nausea. 30 tablet 3  . oxyCODONE (ROXICODONE) 5 MG/5ML solution Place 5 mg into feeding tube every 4 (four) hours as needed for severe pain.    . pantoprazole (PROTONIX) 40 MG tablet TAKE 1 TABLET (40 MG TOTAL) BY MOUTH 2 (TWO) TIMES DAILY. 60 tablet 3  . ranitidine (ZANTAC) 150 MG tablet Take 150 mg by mouth 2 (two) times daily.    [name]  traMADol (ULTRAM) 50 MG tablet Take 1 tablet (50 mg total) by mouth every 6 (six) hours as needed (pain). Reported on 10/14/2015 60 tablet 1  . warfarin (COUMADIN) 2 MG tablet TAKE 1 TABLET DAILY OR AS DIRECTED (Patient taking differently: Take 2 mg by mouth nightly (Sun/Mon/Tues/Wed/Thurs/Fri/Sat) every day) 100 tablet 1   No current facility-administered medications on file prior to visit.     Allergies  Allergen Reactions  . Adhesive [Tape] Other (See Comments)    Tears skin, Please use "paper" tape  . Amiodarone Other (See Comments)     Thyroid and liver and kidney problems  . Codeine Nausea And Vomiting  . Hydrocodone Other (See Comments)    hallucination  . Morphine And Related Nausea And Vomiting  . Requip [Ropinirole Hcl] Other (See Comments)    Headache   . Zinc Nausea Only    nausea  . Reglan [Metoclopramide] Hives, Itching and Rash    Made face red, also    Current Problems (verified) Patient Active Problem List   Diagnosis Date Noted  . Post-operative nausea and vomiting 06/03/2016  . Long term current use of anticoagulant therapy 03/19/2016   . No blood products 02/27/2016  . S/P partial gastrectomy 02/17/2016  . Post gastrectomy syndrome   . Erosive esophagitis   . Gastroesophageal reflux disease with esophagitis   . Pressure ulcer 02/04/2016  . Persistent atrial fibrillation ([place]) 01/15/2016  . Protein-calorie malnutrition, moderate ([place]) 12/08/2015  . B12 deficiency 11/24/2015  . Iron deficiency anemia due to chronic blood loss 11/24/2015  . Refusal of blood transfusions as patient is Jehovah's Witness   . Adenocarcinoma of stomach ([place]) 11/11/2015  . CKD (chronic kidney disease), stage III 11/10/2015  . Vitamin B12 deficiency 11/09/2015  . Peripheral nerve disease ([place]) 07/18/2015  . Rupture patellar tendon 07/29/2014  . Essential hypertension 06/04/2014  . Medication management 02/15/2014  . Prediabetes 09/24/2013  . Vitamin D deficiency 09/24/2013  . Hyperlipemia   . Cardiac pacemaker (06/2010) 08/26/2012  . FHx/o Colon Cancer 07/27/2011  . Aortic stenosis, moderate 07/09/2011  . S/P repair of patent ductus arteriosus 04/22/2011  . Atrial fibrillation ([place]) Chadsvasc 5 12/02/2010  . Malignant neoplasm of kidney excluding renal pelvis ([place]) 11/17/2007  . Hypothyroidism 11/17/2007    Screening Tests Immunization History  Administered Date(s) Administered  . Influenza, High Dose Seasonal PF 07/18/2015  . Influenza,inj,Quad PF,36+ Mos 05/31/2014, 06/09/2016  . Influenza-Unspecified 09/16/2009, 07/03/2013  . Pneumococcal Conjugate-13 09/06/2002  . Pneumococcal Polysaccharide-23 07/18/2015  . Td 09/06/2002  . Zoster 09/06/2008    Preventative care: Last colonoscopy: 03/2016 Last mammogram: 2015 Last pap smear/pelvic exam: 2004 declines another  DEXA:declines at this time  Prior vaccinations: TD or Tdap: 2004 DUE  Influenza: 2017  Pneumococcal: 2016 Prevnar13: 2016 Shingles/Zostavax: 2010  Names of Other Physician/Practitioners you currently use: 1. [facility]  here for primary care 2. Dr. Mauricia Area eye doctor, last visit 2016 3. Dr. Mikeal Hawthorne, dentist, last visit 2015 Patient Care Team: [name], MD as PCP - General (Internal Medicine) [name], MD as Consulting Physician (Gastroenterology)  SURGICAL HISTORY She  has a past surgical history that includes Cardiac catheterization (2008); Nephrectomy (Left); Patent ductus arterious repair (~ 1949); Cholecystectomy (1970s); Upper gastrointestinal endoscopy; Total knee arthroplasty (02/07/2012); Total hip revision (Right, 2009); I&D knee with poly exchange (Right, 12/17/2013); Patellar tendon repair (Right, 03/06/2014); Total knee revision (Right, 07/29/2014); Colonoscopy (N/A, 11/11/2015); Esophagogastroduodenoscopy (N/A, 11/11/2015); Total abdominal hysterectomy (87); laparoscopy (N/A, 01/15/2016); Gastrectomy (  N/A, 01/15/2016); Pacemaker insertion (07/04/11); Esophagogastroduodenoscopy (N/A, 02/05/2016); Gastrectomy (Left, 02/24/2016); Esophagogastroduodenoscopy (N/A, 04/02/2016); Whipple procedure (N/A, 05/18/2016); Gastrojejunostomy (N/A, 05/18/2016); Lysis of adhesion (05/18/2016); and ir generic historical (06/17/2016). FAMILY HISTORY Her family history includes Colon cancer (age of onset: 4) in her mother; Heart attack in her father; Heart disease in her father, maternal grandmother, and mother. SOCIAL HISTORY She  reports that she has never smoked. She has never used smokeless tobacco. She reports that she does not drink alcohol or use drugs.   MEDICARE WELLNESS OBJECTIVES: Physical activity: Current Exercise Habits: The patient does not participate in regular exercise at present Cardiac risk factors: Cardiac Risk Factors include: advanced age (>100mn, >>56women);dyslipidemia;hypertension;sedentary lifestyle Depression/mood screen:   [facility] 08/11/2016  Decreased Interest 0  Down, Depressed, Hopeless 0  PHQ - 2 Score 0  Altered sleeping -  Tired, decreased energy -  Change in appetite -   Feeling bad or failure about yourself  -  Trouble concentrating -  Moving slowly or fidgety/restless -  Suicidal thoughts -  PHQ-9 Score -  Some recent data might be hidden    ADLs:  In your present state of health, do you have any difficulty performing the following activities: 08/11/2016 06/04/2016  Hearing? YTempie Donning Vision? Y Y  Difficulty concentrating or making decisions? N N  Walking or climbing stairs? Y Y  Dressing or bathing? N N  Doing errands, shopping? Y Y  Some recent data might be hidden     Cognitive Testing  Alert? Yes  Normal Appearance?Yes  Oriented to person? Yes  Place? Yes   Time? Yes  Recall of three objects?  Yes  Can perform simple calculations? Yes  Displays appropriate judgment?Yes  Can read the correct time from a watch face?Yes  EOL planning: Does Patient Have a Medical Advance Directive?: Yes  Review of Systems  Constitutional: Negative.   HENT: Negative.   Eyes: Negative.   Respiratory: Negative.   Cardiovascular: Negative.   Gastrointestinal: Negative.   Genitourinary: Negative.   Musculoskeletal: Negative.   Skin: Negative.   Neurological: Negative.   Endo/Heme/Allergies: Negative.   Psychiatric/Behavioral: Negative.      Objective:     Today's Vitals   08/11/16 1430  BP: 132/84  Pulse: 96  Resp: 14  Temp: 97.3 F (36.3 C)  SpO2: 98%  Weight: 136 lb 12.8 oz (62.1 kg)  Height: '5\' 1"'$  (1.549 m)  PainSc: 0-No pain   Body mass index is 25.85 kg/m.  General appearance: alert, no distress, WD/WN, female HEENT: normocephalic, sclerae anicteric, TMs pearly, nares patent, no discharge or erythema, pharynx normal Oral cavity: MMM, no lesions Neck: supple, no lymphadenopathy, no thyromegaly, no masses Heart: RRR, normal S1, S2, no murmurs Lungs: CTA bilaterally, no wheezes, rhonchi, or rales Abdomen: +bs, soft, port for j tube left of umbilicus without erythema, swelling, tenderness, non tender, non distended, no masses, no  hepatomegaly, no splenomegaly Musculoskeletal: nontender, no swelling, no obvious deformity Extremities: no edema, no cyanosis, no clubbing Pulses: 2+ symmetric, upper and lower extremities, normal cap refill Neurological: alert, oriented x 3, CN2-12 intact, strength normal upper extremities and lower extremities, sensation normal throughout, DTRs 2+ throughout, no cerebellar signs, gait antalgic with cane Psychiatric: normal affect, behavior normal, pleasant   Medicare Attestation I have personally reviewed: The patient's medical and social history Their use of alcohol, tobacco or illicit drugs Their current medications and supplements The patient's functional ability including ADLs,fall risks, home safety risks, cognitive, and  hearing and visual impairment Diet and physical activities Evidence for depression or mood disorders  The patient's weight, height, BMI, and visual acuity have been recorded in the chart.  I have made referrals, counseling, and provided education to the patient based on review of the above and I have provided the patient with a written personalized care plan for preventive services.     Vicie Mutters, PA-C   08/11/2016

## 2016-08-12 ENCOUNTER — Other Ambulatory Visit: Payer: Self-pay

## 2016-08-12 LAB — VITAMIN D 25 HYDROXY (VIT D DEFICIENCY, FRACTURES): Vit D, 25-Hydroxy: 52 ng/mL (ref 30–100)

## 2016-08-12 LAB — MAGNESIUM: MAGNESIUM: 1.6 mg/dL (ref 1.5–2.5)

## 2016-08-12 MED ORDER — POTASSIUM CHLORIDE ER 10 MEQ PO TBCR: 10.0000 meq | EXTENDED_RELEASE_TABLET | Freq: Two times a day (BID) | ORAL | 2 refills | Status: DC

## 2016-08-13 DIAGNOSIS — I130 Hypertensive heart and chronic kidney disease with heart failure and stage 1 through stage 4 chronic kidney disease, or unspecified chronic kidney disease: Secondary | ICD-10-CM | POA: Diagnosis not present

## 2016-08-13 DIAGNOSIS — Z434 Encounter for attention to other artificial openings of digestive tract: Secondary | ICD-10-CM | POA: Diagnosis not present

## 2016-08-13 DIAGNOSIS — Z483 Aftercare following surgery for neoplasm: Secondary | ICD-10-CM | POA: Diagnosis not present

## 2016-08-13 DIAGNOSIS — C169 Malignant neoplasm of stomach, unspecified: Secondary | ICD-10-CM | POA: Diagnosis not present

## 2016-08-17 ENCOUNTER — Ambulatory Visit (INDEPENDENT_AMBULATORY_CARE_PROVIDER_SITE_OTHER): Payer: Medicare Other | Admitting: Cardiology

## 2016-08-17 DIAGNOSIS — I480 Paroxysmal atrial fibrillation: Secondary | ICD-10-CM | POA: Diagnosis not present

## 2016-08-17 DIAGNOSIS — Z5181 Encounter for therapeutic drug level monitoring: Secondary | ICD-10-CM | POA: Diagnosis not present

## 2016-08-17 DIAGNOSIS — Z95810 Presence of automatic (implantable) cardiac defibrillator: Secondary | ICD-10-CM | POA: Diagnosis not present

## 2016-08-17 DIAGNOSIS — E785 Hyperlipidemia, unspecified: Secondary | ICD-10-CM | POA: Diagnosis not present

## 2016-08-17 DIAGNOSIS — Z434 Encounter for attention to other artificial openings of digestive tract: Secondary | ICD-10-CM | POA: Diagnosis not present

## 2016-08-17 DIAGNOSIS — Z7901 Long term (current) use of anticoagulants: Secondary | ICD-10-CM | POA: Diagnosis not present

## 2016-08-17 DIAGNOSIS — Z96651 Presence of right artificial knee joint: Secondary | ICD-10-CM | POA: Diagnosis not present

## 2016-08-17 DIAGNOSIS — H548 Legal blindness, as defined in USA: Secondary | ICD-10-CM | POA: Diagnosis not present

## 2016-08-17 DIAGNOSIS — I2510 Atherosclerotic heart disease of native coronary artery without angina pectoris: Secondary | ICD-10-CM | POA: Diagnosis not present

## 2016-08-17 DIAGNOSIS — I69398 Other sequelae of cerebral infarction: Secondary | ICD-10-CM | POA: Diagnosis not present

## 2016-08-17 DIAGNOSIS — I509 Heart failure, unspecified: Secondary | ICD-10-CM | POA: Diagnosis not present

## 2016-08-17 DIAGNOSIS — I130 Hypertensive heart and chronic kidney disease with heart failure and stage 1 through stage 4 chronic kidney disease, or unspecified chronic kidney disease: Secondary | ICD-10-CM | POA: Diagnosis not present

## 2016-08-17 DIAGNOSIS — Z483 Aftercare following surgery for neoplasm: Secondary | ICD-10-CM | POA: Diagnosis not present

## 2016-08-17 DIAGNOSIS — Z96641 Presence of right artificial hip joint: Secondary | ICD-10-CM | POA: Diagnosis not present

## 2016-08-17 DIAGNOSIS — I4891 Unspecified atrial fibrillation: Secondary | ICD-10-CM

## 2016-08-17 DIAGNOSIS — N183 Chronic kidney disease, stage 3 (moderate): Secondary | ICD-10-CM | POA: Diagnosis not present

## 2016-08-17 DIAGNOSIS — Z79891 Long term (current) use of opiate analgesic: Secondary | ICD-10-CM | POA: Diagnosis not present

## 2016-08-17 DIAGNOSIS — K210 Gastro-esophageal reflux disease with esophagitis: Secondary | ICD-10-CM | POA: Diagnosis not present

## 2016-08-17 DIAGNOSIS — Z85528 Personal history of other malignant neoplasm of kidney: Secondary | ICD-10-CM | POA: Diagnosis not present

## 2016-08-17 DIAGNOSIS — C169 Malignant neoplasm of stomach, unspecified: Secondary | ICD-10-CM | POA: Diagnosis not present

## 2016-08-17 DIAGNOSIS — D649 Anemia, unspecified: Secondary | ICD-10-CM | POA: Diagnosis not present

## 2016-08-17 LAB — POCT INR: INR: 2.3

## 2016-08-19 ENCOUNTER — Encounter: Payer: Self-pay | Admitting: Internal Medicine

## 2016-08-20 ENCOUNTER — Ambulatory Visit (INDEPENDENT_AMBULATORY_CARE_PROVIDER_SITE_OTHER): Payer: Medicare Other | Admitting: *Deleted

## 2016-08-20 DIAGNOSIS — I495 Sick sinus syndrome: Secondary | ICD-10-CM

## 2016-08-22 ENCOUNTER — Other Ambulatory Visit: Payer: Self-pay | Admitting: Internal Medicine

## 2016-08-23 DIAGNOSIS — Z8554 Personal history of malignant neoplasm of ureter: Secondary | ICD-10-CM | POA: Diagnosis not present

## 2016-08-23 DIAGNOSIS — C169 Malignant neoplasm of stomach, unspecified: Secondary | ICD-10-CM | POA: Diagnosis not present

## 2016-08-24 ENCOUNTER — Other Ambulatory Visit: Payer: Self-pay | Admitting: Internal Medicine

## 2016-08-24 ENCOUNTER — Ambulatory Visit (INDEPENDENT_AMBULATORY_CARE_PROVIDER_SITE_OTHER): Payer: Medicare Other

## 2016-08-24 DIAGNOSIS — Z85528 Personal history of other malignant neoplasm of kidney: Secondary | ICD-10-CM | POA: Diagnosis not present

## 2016-08-24 DIAGNOSIS — Z7901 Long term (current) use of anticoagulants: Secondary | ICD-10-CM

## 2016-08-24 DIAGNOSIS — I509 Heart failure, unspecified: Secondary | ICD-10-CM | POA: Diagnosis not present

## 2016-08-24 DIAGNOSIS — Z96651 Presence of right artificial knee joint: Secondary | ICD-10-CM | POA: Diagnosis not present

## 2016-08-24 DIAGNOSIS — Z483 Aftercare following surgery for neoplasm: Secondary | ICD-10-CM | POA: Diagnosis not present

## 2016-08-24 DIAGNOSIS — Z96641 Presence of right artificial hip joint: Secondary | ICD-10-CM | POA: Diagnosis not present

## 2016-08-24 DIAGNOSIS — Z434 Encounter for attention to other artificial openings of digestive tract: Secondary | ICD-10-CM | POA: Diagnosis not present

## 2016-08-24 DIAGNOSIS — E785 Hyperlipidemia, unspecified: Secondary | ICD-10-CM | POA: Diagnosis not present

## 2016-08-24 DIAGNOSIS — Z5181 Encounter for therapeutic drug level monitoring: Secondary | ICD-10-CM | POA: Diagnosis not present

## 2016-08-24 DIAGNOSIS — I480 Paroxysmal atrial fibrillation: Secondary | ICD-10-CM | POA: Diagnosis not present

## 2016-08-24 DIAGNOSIS — H548 Legal blindness, as defined in USA: Secondary | ICD-10-CM | POA: Diagnosis not present

## 2016-08-24 DIAGNOSIS — I130 Hypertensive heart and chronic kidney disease with heart failure and stage 1 through stage 4 chronic kidney disease, or unspecified chronic kidney disease: Secondary | ICD-10-CM | POA: Diagnosis not present

## 2016-08-24 DIAGNOSIS — Z79891 Long term (current) use of opiate analgesic: Secondary | ICD-10-CM | POA: Diagnosis not present

## 2016-08-24 DIAGNOSIS — K210 Gastro-esophageal reflux disease with esophagitis: Secondary | ICD-10-CM | POA: Diagnosis not present

## 2016-08-24 DIAGNOSIS — D649 Anemia, unspecified: Secondary | ICD-10-CM | POA: Diagnosis not present

## 2016-08-24 DIAGNOSIS — C169 Malignant neoplasm of stomach, unspecified: Secondary | ICD-10-CM | POA: Diagnosis not present

## 2016-08-24 DIAGNOSIS — I2510 Atherosclerotic heart disease of native coronary artery without angina pectoris: Secondary | ICD-10-CM | POA: Diagnosis not present

## 2016-08-24 DIAGNOSIS — Z95810 Presence of automatic (implantable) cardiac defibrillator: Secondary | ICD-10-CM | POA: Diagnosis not present

## 2016-08-24 DIAGNOSIS — N183 Chronic kidney disease, stage 3 (moderate): Secondary | ICD-10-CM | POA: Diagnosis not present

## 2016-08-24 DIAGNOSIS — I4891 Unspecified atrial fibrillation: Secondary | ICD-10-CM

## 2016-08-24 DIAGNOSIS — I69398 Other sequelae of cerebral infarction: Secondary | ICD-10-CM | POA: Diagnosis not present

## 2016-08-24 LAB — POCT INR: INR: 3

## 2016-08-24 NOTE — Progress Notes (Signed)
Remote pacemaker transmission.   

## 2016-08-25 ENCOUNTER — Encounter: Payer: Self-pay | Admitting: Cardiology

## 2016-08-26 ENCOUNTER — Encounter: Payer: Self-pay | Admitting: *Deleted

## 2016-08-26 NOTE — Progress Notes (Signed)
Oncology Nurse Navigator Documentation  Oncology Nurse Navigator Flowsheets 08/26/2016  Navigator Location [facility]  Navigator Encounter Type Letter/Fax/Email  Barriers/Navigation Needs Coordination of Care--received message from [name] for her to see Dr. Irene Limbo in 6 months for follow up.  Interventions Coordination of Care--LOS message to scheduler to see Dr. Irene Limbo in 6 months per [name]  Coordination of Care Appts

## 2016-09-01 DIAGNOSIS — Z483 Aftercare following surgery for neoplasm: Secondary | ICD-10-CM | POA: Diagnosis not present

## 2016-09-01 DIAGNOSIS — Z85528 Personal history of other malignant neoplasm of kidney: Secondary | ICD-10-CM | POA: Diagnosis not present

## 2016-09-01 DIAGNOSIS — N183 Chronic kidney disease, stage 3 (moderate): Secondary | ICD-10-CM | POA: Diagnosis not present

## 2016-09-01 DIAGNOSIS — Z7901 Long term (current) use of anticoagulants: Secondary | ICD-10-CM | POA: Diagnosis not present

## 2016-09-01 DIAGNOSIS — I69398 Other sequelae of cerebral infarction: Secondary | ICD-10-CM | POA: Diagnosis not present

## 2016-09-01 DIAGNOSIS — K210 Gastro-esophageal reflux disease with esophagitis: Secondary | ICD-10-CM | POA: Diagnosis not present

## 2016-09-01 DIAGNOSIS — I2510 Atherosclerotic heart disease of native coronary artery without angina pectoris: Secondary | ICD-10-CM | POA: Diagnosis not present

## 2016-09-01 DIAGNOSIS — D649 Anemia, unspecified: Secondary | ICD-10-CM | POA: Diagnosis not present

## 2016-09-01 DIAGNOSIS — I509 Heart failure, unspecified: Secondary | ICD-10-CM | POA: Diagnosis not present

## 2016-09-01 DIAGNOSIS — Z96641 Presence of right artificial hip joint: Secondary | ICD-10-CM | POA: Diagnosis not present

## 2016-09-01 DIAGNOSIS — Z434 Encounter for attention to other artificial openings of digestive tract: Secondary | ICD-10-CM | POA: Diagnosis not present

## 2016-09-01 DIAGNOSIS — C169 Malignant neoplasm of stomach, unspecified: Secondary | ICD-10-CM | POA: Diagnosis not present

## 2016-09-01 DIAGNOSIS — I480 Paroxysmal atrial fibrillation: Secondary | ICD-10-CM | POA: Diagnosis not present

## 2016-09-01 DIAGNOSIS — E785 Hyperlipidemia, unspecified: Secondary | ICD-10-CM | POA: Diagnosis not present

## 2016-09-01 DIAGNOSIS — Z96651 Presence of right artificial knee joint: Secondary | ICD-10-CM | POA: Diagnosis not present

## 2016-09-01 DIAGNOSIS — Z79891 Long term (current) use of opiate analgesic: Secondary | ICD-10-CM | POA: Diagnosis not present

## 2016-09-01 DIAGNOSIS — H548 Legal blindness, as defined in USA: Secondary | ICD-10-CM | POA: Diagnosis not present

## 2016-09-01 DIAGNOSIS — I130 Hypertensive heart and chronic kidney disease with heart failure and stage 1 through stage 4 chronic kidney disease, or unspecified chronic kidney disease: Secondary | ICD-10-CM | POA: Diagnosis not present

## 2016-09-01 DIAGNOSIS — Z5181 Encounter for therapeutic drug level monitoring: Secondary | ICD-10-CM | POA: Diagnosis not present

## 2016-09-01 DIAGNOSIS — Z95810 Presence of automatic (implantable) cardiac defibrillator: Secondary | ICD-10-CM | POA: Diagnosis not present

## 2016-09-03 ENCOUNTER — Ambulatory Visit (INDEPENDENT_AMBULATORY_CARE_PROVIDER_SITE_OTHER): Payer: Medicare Other | Admitting: Cardiology

## 2016-09-03 DIAGNOSIS — H548 Legal blindness, as defined in USA: Secondary | ICD-10-CM | POA: Diagnosis not present

## 2016-09-03 DIAGNOSIS — I130 Hypertensive heart and chronic kidney disease with heart failure and stage 1 through stage 4 chronic kidney disease, or unspecified chronic kidney disease: Secondary | ICD-10-CM | POA: Diagnosis not present

## 2016-09-03 DIAGNOSIS — Z95810 Presence of automatic (implantable) cardiac defibrillator: Secondary | ICD-10-CM | POA: Diagnosis not present

## 2016-09-03 DIAGNOSIS — Z7901 Long term (current) use of anticoagulants: Secondary | ICD-10-CM

## 2016-09-03 DIAGNOSIS — I480 Paroxysmal atrial fibrillation: Secondary | ICD-10-CM | POA: Diagnosis not present

## 2016-09-03 DIAGNOSIS — Z79891 Long term (current) use of opiate analgesic: Secondary | ICD-10-CM | POA: Diagnosis not present

## 2016-09-03 DIAGNOSIS — Z85528 Personal history of other malignant neoplasm of kidney: Secondary | ICD-10-CM | POA: Diagnosis not present

## 2016-09-03 DIAGNOSIS — N183 Chronic kidney disease, stage 3 (moderate): Secondary | ICD-10-CM | POA: Diagnosis not present

## 2016-09-03 DIAGNOSIS — I4891 Unspecified atrial fibrillation: Secondary | ICD-10-CM

## 2016-09-03 DIAGNOSIS — I509 Heart failure, unspecified: Secondary | ICD-10-CM | POA: Diagnosis not present

## 2016-09-03 DIAGNOSIS — Z96651 Presence of right artificial knee joint: Secondary | ICD-10-CM | POA: Diagnosis not present

## 2016-09-03 DIAGNOSIS — Z5181 Encounter for therapeutic drug level monitoring: Secondary | ICD-10-CM | POA: Diagnosis not present

## 2016-09-03 DIAGNOSIS — C169 Malignant neoplasm of stomach, unspecified: Secondary | ICD-10-CM | POA: Diagnosis not present

## 2016-09-03 DIAGNOSIS — E785 Hyperlipidemia, unspecified: Secondary | ICD-10-CM | POA: Diagnosis not present

## 2016-09-03 DIAGNOSIS — Z483 Aftercare following surgery for neoplasm: Secondary | ICD-10-CM | POA: Diagnosis not present

## 2016-09-03 DIAGNOSIS — K210 Gastro-esophageal reflux disease with esophagitis: Secondary | ICD-10-CM | POA: Diagnosis not present

## 2016-09-03 DIAGNOSIS — I2510 Atherosclerotic heart disease of native coronary artery without angina pectoris: Secondary | ICD-10-CM | POA: Diagnosis not present

## 2016-09-03 DIAGNOSIS — I69398 Other sequelae of cerebral infarction: Secondary | ICD-10-CM | POA: Diagnosis not present

## 2016-09-03 DIAGNOSIS — Z434 Encounter for attention to other artificial openings of digestive tract: Secondary | ICD-10-CM | POA: Diagnosis not present

## 2016-09-03 DIAGNOSIS — Z96641 Presence of right artificial hip joint: Secondary | ICD-10-CM | POA: Diagnosis not present

## 2016-09-03 DIAGNOSIS — D649 Anemia, unspecified: Secondary | ICD-10-CM | POA: Diagnosis not present

## 2016-09-03 LAB — CUP PACEART REMOTE DEVICE CHECK
Battery Impedance: 642 Ohm
Brady Statistic AP VP Percent: 0 %
Brady Statistic AP VS Percent: 21 %
Brady Statistic AS VS Percent: 79 %
Date Time Interrogation Session: 20171215165055
Implantable Lead Implant Date: 20111027
Implantable Lead Location: 753860
Implantable Lead Model: 4469
Lead Channel Impedance Value: 437 Ohm
Lead Channel Sensing Intrinsic Amplitude: 1.4 mV
Lead Channel Sensing Intrinsic Amplitude: 5.6 mV
MDC IDC LEAD IMPLANT DT: 20111027
MDC IDC LEAD LOCATION: 753859
MDC IDC LEAD SERIAL: 548229
MDC IDC LEAD SERIAL: 686026
MDC IDC MSMT BATTERY REMAINING LONGEVITY: 76 mo
MDC IDC MSMT BATTERY VOLTAGE: 2.8 V
MDC IDC MSMT LEADCHNL RA IMPEDANCE VALUE: 537 Ohm
MDC IDC MSMT LEADCHNL RA PACING THRESHOLD AMPLITUDE: 0.375 V
MDC IDC MSMT LEADCHNL RA PACING THRESHOLD PULSEWIDTH: 0.4 ms
MDC IDC MSMT LEADCHNL RV PACING THRESHOLD AMPLITUDE: 1.25 V
MDC IDC MSMT LEADCHNL RV PACING THRESHOLD PULSEWIDTH: 0.4 ms
MDC IDC PG IMPLANT DT: 20111027
MDC IDC SET LEADCHNL RA PACING AMPLITUDE: 2 V
MDC IDC SET LEADCHNL RV PACING AMPLITUDE: 2.5 V
MDC IDC SET LEADCHNL RV PACING PULSEWIDTH: 0.4 ms
MDC IDC SET LEADCHNL RV SENSING SENSITIVITY: 2 mV
MDC IDC STAT BRADY AS VP PERCENT: 0 %

## 2016-09-03 LAB — POCT INR: INR: 3.3

## 2016-09-08 DIAGNOSIS — Z79891 Long term (current) use of opiate analgesic: Secondary | ICD-10-CM | POA: Diagnosis not present

## 2016-09-08 DIAGNOSIS — Z95810 Presence of automatic (implantable) cardiac defibrillator: Secondary | ICD-10-CM | POA: Diagnosis not present

## 2016-09-08 DIAGNOSIS — N183 Chronic kidney disease, stage 3 (moderate): Secondary | ICD-10-CM | POA: Diagnosis not present

## 2016-09-08 DIAGNOSIS — I69398 Other sequelae of cerebral infarction: Secondary | ICD-10-CM | POA: Diagnosis not present

## 2016-09-08 DIAGNOSIS — I480 Paroxysmal atrial fibrillation: Secondary | ICD-10-CM | POA: Diagnosis not present

## 2016-09-08 DIAGNOSIS — Z96651 Presence of right artificial knee joint: Secondary | ICD-10-CM | POA: Diagnosis not present

## 2016-09-08 DIAGNOSIS — C169 Malignant neoplasm of stomach, unspecified: Secondary | ICD-10-CM | POA: Diagnosis not present

## 2016-09-08 DIAGNOSIS — I509 Heart failure, unspecified: Secondary | ICD-10-CM | POA: Diagnosis not present

## 2016-09-08 DIAGNOSIS — D649 Anemia, unspecified: Secondary | ICD-10-CM | POA: Diagnosis not present

## 2016-09-08 DIAGNOSIS — Z5181 Encounter for therapeutic drug level monitoring: Secondary | ICD-10-CM | POA: Diagnosis not present

## 2016-09-08 DIAGNOSIS — Z483 Aftercare following surgery for neoplasm: Secondary | ICD-10-CM | POA: Diagnosis not present

## 2016-09-08 DIAGNOSIS — Z434 Encounter for attention to other artificial openings of digestive tract: Secondary | ICD-10-CM | POA: Diagnosis not present

## 2016-09-08 DIAGNOSIS — Z96641 Presence of right artificial hip joint: Secondary | ICD-10-CM | POA: Diagnosis not present

## 2016-09-08 DIAGNOSIS — I130 Hypertensive heart and chronic kidney disease with heart failure and stage 1 through stage 4 chronic kidney disease, or unspecified chronic kidney disease: Secondary | ICD-10-CM | POA: Diagnosis not present

## 2016-09-08 DIAGNOSIS — I2510 Atherosclerotic heart disease of native coronary artery without angina pectoris: Secondary | ICD-10-CM | POA: Diagnosis not present

## 2016-09-08 DIAGNOSIS — K210 Gastro-esophageal reflux disease with esophagitis: Secondary | ICD-10-CM | POA: Diagnosis not present

## 2016-09-08 DIAGNOSIS — Z85528 Personal history of other malignant neoplasm of kidney: Secondary | ICD-10-CM | POA: Diagnosis not present

## 2016-09-08 DIAGNOSIS — H548 Legal blindness, as defined in USA: Secondary | ICD-10-CM | POA: Diagnosis not present

## 2016-09-08 DIAGNOSIS — E785 Hyperlipidemia, unspecified: Secondary | ICD-10-CM | POA: Diagnosis not present

## 2016-09-08 DIAGNOSIS — Z7901 Long term (current) use of anticoagulants: Secondary | ICD-10-CM | POA: Diagnosis not present

## 2016-09-10 ENCOUNTER — Encounter: Payer: Self-pay | Admitting: Cardiology

## 2016-09-13 ENCOUNTER — Telehealth: Payer: Self-pay | Admitting: Hematology

## 2016-09-13 NOTE — Telephone Encounter (Signed)
Not able to reach patient by phone or leave message. June f/u mailed.

## 2016-09-15 DIAGNOSIS — Z5181 Encounter for therapeutic drug level monitoring: Secondary | ICD-10-CM | POA: Diagnosis not present

## 2016-09-15 DIAGNOSIS — Z483 Aftercare following surgery for neoplasm: Secondary | ICD-10-CM | POA: Diagnosis not present

## 2016-09-15 DIAGNOSIS — C169 Malignant neoplasm of stomach, unspecified: Secondary | ICD-10-CM | POA: Diagnosis not present

## 2016-09-15 DIAGNOSIS — Z7901 Long term (current) use of anticoagulants: Secondary | ICD-10-CM | POA: Diagnosis not present

## 2016-09-15 DIAGNOSIS — Z79891 Long term (current) use of opiate analgesic: Secondary | ICD-10-CM | POA: Diagnosis not present

## 2016-09-15 DIAGNOSIS — I480 Paroxysmal atrial fibrillation: Secondary | ICD-10-CM | POA: Diagnosis not present

## 2016-09-15 DIAGNOSIS — Z95810 Presence of automatic (implantable) cardiac defibrillator: Secondary | ICD-10-CM | POA: Diagnosis not present

## 2016-09-15 DIAGNOSIS — Z96641 Presence of right artificial hip joint: Secondary | ICD-10-CM | POA: Diagnosis not present

## 2016-09-15 DIAGNOSIS — Z96651 Presence of right artificial knee joint: Secondary | ICD-10-CM | POA: Diagnosis not present

## 2016-09-15 DIAGNOSIS — I509 Heart failure, unspecified: Secondary | ICD-10-CM | POA: Diagnosis not present

## 2016-09-15 DIAGNOSIS — K210 Gastro-esophageal reflux disease with esophagitis: Secondary | ICD-10-CM | POA: Diagnosis not present

## 2016-09-15 DIAGNOSIS — I130 Hypertensive heart and chronic kidney disease with heart failure and stage 1 through stage 4 chronic kidney disease, or unspecified chronic kidney disease: Secondary | ICD-10-CM | POA: Diagnosis not present

## 2016-09-15 DIAGNOSIS — I69398 Other sequelae of cerebral infarction: Secondary | ICD-10-CM | POA: Diagnosis not present

## 2016-09-15 DIAGNOSIS — D649 Anemia, unspecified: Secondary | ICD-10-CM | POA: Diagnosis not present

## 2016-09-15 DIAGNOSIS — E785 Hyperlipidemia, unspecified: Secondary | ICD-10-CM | POA: Diagnosis not present

## 2016-09-15 DIAGNOSIS — I2510 Atherosclerotic heart disease of native coronary artery without angina pectoris: Secondary | ICD-10-CM | POA: Diagnosis not present

## 2016-09-15 DIAGNOSIS — N183 Chronic kidney disease, stage 3 (moderate): Secondary | ICD-10-CM | POA: Diagnosis not present

## 2016-09-15 DIAGNOSIS — Z85528 Personal history of other malignant neoplasm of kidney: Secondary | ICD-10-CM | POA: Diagnosis not present

## 2016-09-15 DIAGNOSIS — Z434 Encounter for attention to other artificial openings of digestive tract: Secondary | ICD-10-CM | POA: Diagnosis not present

## 2016-09-15 DIAGNOSIS — H548 Legal blindness, as defined in USA: Secondary | ICD-10-CM | POA: Diagnosis not present

## 2016-09-17 ENCOUNTER — Telehealth: Payer: Self-pay | Admitting: Hematology

## 2016-09-17 ENCOUNTER — Ambulatory Visit (INDEPENDENT_AMBULATORY_CARE_PROVIDER_SITE_OTHER): Payer: Medicare Other | Admitting: Pharmacist

## 2016-09-17 DIAGNOSIS — Z96651 Presence of right artificial knee joint: Secondary | ICD-10-CM | POA: Diagnosis not present

## 2016-09-17 DIAGNOSIS — C169 Malignant neoplasm of stomach, unspecified: Secondary | ICD-10-CM | POA: Diagnosis not present

## 2016-09-17 DIAGNOSIS — Z7901 Long term (current) use of anticoagulants: Secondary | ICD-10-CM

## 2016-09-17 DIAGNOSIS — I509 Heart failure, unspecified: Secondary | ICD-10-CM | POA: Diagnosis not present

## 2016-09-17 DIAGNOSIS — Z95810 Presence of automatic (implantable) cardiac defibrillator: Secondary | ICD-10-CM | POA: Diagnosis not present

## 2016-09-17 DIAGNOSIS — Z79891 Long term (current) use of opiate analgesic: Secondary | ICD-10-CM | POA: Diagnosis not present

## 2016-09-17 DIAGNOSIS — Z85528 Personal history of other malignant neoplasm of kidney: Secondary | ICD-10-CM | POA: Diagnosis not present

## 2016-09-17 DIAGNOSIS — H548 Legal blindness, as defined in USA: Secondary | ICD-10-CM | POA: Diagnosis not present

## 2016-09-17 DIAGNOSIS — I130 Hypertensive heart and chronic kidney disease with heart failure and stage 1 through stage 4 chronic kidney disease, or unspecified chronic kidney disease: Secondary | ICD-10-CM | POA: Diagnosis not present

## 2016-09-17 DIAGNOSIS — I2510 Atherosclerotic heart disease of native coronary artery without angina pectoris: Secondary | ICD-10-CM | POA: Diagnosis not present

## 2016-09-17 DIAGNOSIS — K210 Gastro-esophageal reflux disease with esophagitis: Secondary | ICD-10-CM | POA: Diagnosis not present

## 2016-09-17 DIAGNOSIS — D649 Anemia, unspecified: Secondary | ICD-10-CM | POA: Diagnosis not present

## 2016-09-17 DIAGNOSIS — Z5181 Encounter for therapeutic drug level monitoring: Secondary | ICD-10-CM | POA: Diagnosis not present

## 2016-09-17 DIAGNOSIS — E785 Hyperlipidemia, unspecified: Secondary | ICD-10-CM | POA: Diagnosis not present

## 2016-09-17 DIAGNOSIS — Z483 Aftercare following surgery for neoplasm: Secondary | ICD-10-CM | POA: Diagnosis not present

## 2016-09-17 DIAGNOSIS — I69398 Other sequelae of cerebral infarction: Secondary | ICD-10-CM | POA: Diagnosis not present

## 2016-09-17 DIAGNOSIS — Z96641 Presence of right artificial hip joint: Secondary | ICD-10-CM | POA: Diagnosis not present

## 2016-09-17 DIAGNOSIS — I480 Paroxysmal atrial fibrillation: Secondary | ICD-10-CM | POA: Diagnosis not present

## 2016-09-17 DIAGNOSIS — N183 Chronic kidney disease, stage 3 (moderate): Secondary | ICD-10-CM | POA: Diagnosis not present

## 2016-09-17 DIAGNOSIS — Z434 Encounter for attention to other artificial openings of digestive tract: Secondary | ICD-10-CM | POA: Diagnosis not present

## 2016-09-17 DIAGNOSIS — I4891 Unspecified atrial fibrillation: Secondary | ICD-10-CM

## 2016-09-17 LAB — POCT INR: INR: 3

## 2016-09-17 NOTE — Telephone Encounter (Signed)
Pt dtr called to cxl June 2018 appt. dtr stated that she has been released from care and don't know who sched the appt. cxled appt per request

## 2016-09-18 ENCOUNTER — Other Ambulatory Visit: Payer: Self-pay | Admitting: Internal Medicine

## 2016-10-01 ENCOUNTER — Ambulatory Visit (INDEPENDENT_AMBULATORY_CARE_PROVIDER_SITE_OTHER): Payer: Medicare Other | Admitting: Internal Medicine

## 2016-10-01 DIAGNOSIS — I480 Paroxysmal atrial fibrillation: Secondary | ICD-10-CM | POA: Diagnosis not present

## 2016-10-01 DIAGNOSIS — I130 Hypertensive heart and chronic kidney disease with heart failure and stage 1 through stage 4 chronic kidney disease, or unspecified chronic kidney disease: Secondary | ICD-10-CM | POA: Diagnosis not present

## 2016-10-01 DIAGNOSIS — Z5181 Encounter for therapeutic drug level monitoring: Secondary | ICD-10-CM | POA: Diagnosis not present

## 2016-10-01 DIAGNOSIS — D649 Anemia, unspecified: Secondary | ICD-10-CM | POA: Diagnosis not present

## 2016-10-01 DIAGNOSIS — Z79891 Long term (current) use of opiate analgesic: Secondary | ICD-10-CM | POA: Diagnosis not present

## 2016-10-01 DIAGNOSIS — H548 Legal blindness, as defined in USA: Secondary | ICD-10-CM | POA: Diagnosis not present

## 2016-10-01 DIAGNOSIS — Z7901 Long term (current) use of anticoagulants: Secondary | ICD-10-CM

## 2016-10-01 DIAGNOSIS — Z95810 Presence of automatic (implantable) cardiac defibrillator: Secondary | ICD-10-CM | POA: Diagnosis not present

## 2016-10-01 DIAGNOSIS — Z434 Encounter for attention to other artificial openings of digestive tract: Secondary | ICD-10-CM | POA: Diagnosis not present

## 2016-10-01 DIAGNOSIS — N183 Chronic kidney disease, stage 3 (moderate): Secondary | ICD-10-CM | POA: Diagnosis not present

## 2016-10-01 DIAGNOSIS — Z85528 Personal history of other malignant neoplasm of kidney: Secondary | ICD-10-CM | POA: Diagnosis not present

## 2016-10-01 DIAGNOSIS — I2510 Atherosclerotic heart disease of native coronary artery without angina pectoris: Secondary | ICD-10-CM | POA: Diagnosis not present

## 2016-10-01 DIAGNOSIS — Z483 Aftercare following surgery for neoplasm: Secondary | ICD-10-CM | POA: Diagnosis not present

## 2016-10-01 DIAGNOSIS — I4891 Unspecified atrial fibrillation: Secondary | ICD-10-CM

## 2016-10-01 DIAGNOSIS — I509 Heart failure, unspecified: Secondary | ICD-10-CM | POA: Diagnosis not present

## 2016-10-01 DIAGNOSIS — K210 Gastro-esophageal reflux disease with esophagitis: Secondary | ICD-10-CM | POA: Diagnosis not present

## 2016-10-01 DIAGNOSIS — I69398 Other sequelae of cerebral infarction: Secondary | ICD-10-CM | POA: Diagnosis not present

## 2016-10-01 DIAGNOSIS — E785 Hyperlipidemia, unspecified: Secondary | ICD-10-CM | POA: Diagnosis not present

## 2016-10-01 DIAGNOSIS — Z96641 Presence of right artificial hip joint: Secondary | ICD-10-CM | POA: Diagnosis not present

## 2016-10-01 DIAGNOSIS — C169 Malignant neoplasm of stomach, unspecified: Secondary | ICD-10-CM | POA: Diagnosis not present

## 2016-10-01 DIAGNOSIS — Z96651 Presence of right artificial knee joint: Secondary | ICD-10-CM | POA: Diagnosis not present

## 2016-10-01 LAB — POCT INR: INR: 3.6

## 2016-10-06 ENCOUNTER — Other Ambulatory Visit: Payer: Self-pay | Admitting: Internal Medicine

## 2016-10-08 DIAGNOSIS — I2510 Atherosclerotic heart disease of native coronary artery without angina pectoris: Secondary | ICD-10-CM | POA: Diagnosis not present

## 2016-10-08 DIAGNOSIS — Z85528 Personal history of other malignant neoplasm of kidney: Secondary | ICD-10-CM | POA: Diagnosis not present

## 2016-10-08 DIAGNOSIS — I480 Paroxysmal atrial fibrillation: Secondary | ICD-10-CM | POA: Diagnosis not present

## 2016-10-08 DIAGNOSIS — I509 Heart failure, unspecified: Secondary | ICD-10-CM | POA: Diagnosis not present

## 2016-10-08 DIAGNOSIS — Z96651 Presence of right artificial knee joint: Secondary | ICD-10-CM | POA: Diagnosis not present

## 2016-10-08 DIAGNOSIS — E785 Hyperlipidemia, unspecified: Secondary | ICD-10-CM | POA: Diagnosis not present

## 2016-10-08 DIAGNOSIS — Z79891 Long term (current) use of opiate analgesic: Secondary | ICD-10-CM | POA: Diagnosis not present

## 2016-10-08 DIAGNOSIS — Z483 Aftercare following surgery for neoplasm: Secondary | ICD-10-CM | POA: Diagnosis not present

## 2016-10-08 DIAGNOSIS — Z434 Encounter for attention to other artificial openings of digestive tract: Secondary | ICD-10-CM | POA: Diagnosis not present

## 2016-10-08 DIAGNOSIS — Z5181 Encounter for therapeutic drug level monitoring: Secondary | ICD-10-CM | POA: Diagnosis not present

## 2016-10-08 DIAGNOSIS — C169 Malignant neoplasm of stomach, unspecified: Secondary | ICD-10-CM | POA: Diagnosis not present

## 2016-10-08 DIAGNOSIS — K210 Gastro-esophageal reflux disease with esophagitis: Secondary | ICD-10-CM | POA: Diagnosis not present

## 2016-10-08 DIAGNOSIS — Z7901 Long term (current) use of anticoagulants: Secondary | ICD-10-CM | POA: Diagnosis not present

## 2016-10-08 DIAGNOSIS — D649 Anemia, unspecified: Secondary | ICD-10-CM | POA: Diagnosis not present

## 2016-10-08 DIAGNOSIS — I130 Hypertensive heart and chronic kidney disease with heart failure and stage 1 through stage 4 chronic kidney disease, or unspecified chronic kidney disease: Secondary | ICD-10-CM | POA: Diagnosis not present

## 2016-10-08 DIAGNOSIS — Z96641 Presence of right artificial hip joint: Secondary | ICD-10-CM | POA: Diagnosis not present

## 2016-10-08 DIAGNOSIS — N183 Chronic kidney disease, stage 3 (moderate): Secondary | ICD-10-CM | POA: Diagnosis not present

## 2016-10-08 DIAGNOSIS — Z95810 Presence of automatic (implantable) cardiac defibrillator: Secondary | ICD-10-CM | POA: Diagnosis not present

## 2016-10-08 DIAGNOSIS — I69398 Other sequelae of cerebral infarction: Secondary | ICD-10-CM | POA: Diagnosis not present

## 2016-10-08 DIAGNOSIS — H548 Legal blindness, as defined in USA: Secondary | ICD-10-CM | POA: Diagnosis not present

## 2016-10-15 ENCOUNTER — Ambulatory Visit (INDEPENDENT_AMBULATORY_CARE_PROVIDER_SITE_OTHER): Payer: Medicare Other | Admitting: *Deleted

## 2016-10-15 DIAGNOSIS — Z7901 Long term (current) use of anticoagulants: Secondary | ICD-10-CM

## 2016-10-15 DIAGNOSIS — I4891 Unspecified atrial fibrillation: Secondary | ICD-10-CM | POA: Diagnosis not present

## 2016-10-15 LAB — POCT INR: INR: 2.1

## 2016-10-19 ENCOUNTER — Telehealth: Payer: Self-pay | Admitting: *Deleted

## 2016-10-19 NOTE — Telephone Encounter (Signed)
Patient's daughter called and states the patient's BP is low and she did not take her Metoprolol this AM. The patient feels bad and has lost 10 pounds since her last OV.  The patient will be seen in the office on 10/20/2016 to be evaluated.  Per Dr Melford Aase, the patient can hold her BP medications intil then.

## 2016-10-20 ENCOUNTER — Emergency Department (HOSPITAL_COMMUNITY): Payer: Medicare Other

## 2016-10-20 ENCOUNTER — Observation Stay (HOSPITAL_COMMUNITY)
Admission: EM | Admit: 2016-10-20 | Discharge: 2016-10-22 | Disposition: A | Discharge status: Home / Self Care | Payer: Medicare Other | Attending: Internal Medicine | Admitting: Internal Medicine

## 2016-10-20 ENCOUNTER — Encounter (HOSPITAL_COMMUNITY): Payer: Self-pay | Admitting: Emergency Medicine

## 2016-10-20 ENCOUNTER — Ambulatory Visit: Payer: Medicare Other | Admitting: Internal Medicine

## 2016-10-20 DIAGNOSIS — E039 Hypothyroidism, unspecified: Secondary | ICD-10-CM | POA: Diagnosis present

## 2016-10-20 DIAGNOSIS — I480 Paroxysmal atrial fibrillation: Secondary | ICD-10-CM | POA: Insufficient documentation

## 2016-10-20 DIAGNOSIS — Z95810 Presence of automatic (implantable) cardiac defibrillator: Secondary | ICD-10-CM | POA: Insufficient documentation

## 2016-10-20 DIAGNOSIS — K2100 Gastro-esophageal reflux disease with esophagitis, without bleeding: Secondary | ICD-10-CM | POA: Diagnosis present

## 2016-10-20 DIAGNOSIS — L8990 Pressure ulcer of unspecified site, unspecified stage: Secondary | ICD-10-CM | POA: Insufficient documentation

## 2016-10-20 DIAGNOSIS — Z8542 Personal history of malignant neoplasm of other parts of uterus: Secondary | ICD-10-CM | POA: Insufficient documentation

## 2016-10-20 DIAGNOSIS — Z885 Allergy status to narcotic agent status: Secondary | ICD-10-CM | POA: Diagnosis not present

## 2016-10-20 DIAGNOSIS — Z888 Allergy status to other drugs, medicaments and biological substances status: Secondary | ICD-10-CM | POA: Insufficient documentation

## 2016-10-20 DIAGNOSIS — E876 Hypokalemia: Secondary | ICD-10-CM | POA: Insufficient documentation

## 2016-10-20 DIAGNOSIS — I2510 Atherosclerotic heart disease of native coronary artery without angina pectoris: Secondary | ICD-10-CM | POA: Insufficient documentation

## 2016-10-20 DIAGNOSIS — Z85028 Personal history of other malignant neoplasm of stomach: Secondary | ICD-10-CM | POA: Insufficient documentation

## 2016-10-20 DIAGNOSIS — E784 Other hyperlipidemia: Secondary | ICD-10-CM

## 2016-10-20 DIAGNOSIS — R079 Chest pain, unspecified: Secondary | ICD-10-CM | POA: Diagnosis not present

## 2016-10-20 DIAGNOSIS — E878 Other disorders of electrolyte and fluid balance, not elsewhere classified: Secondary | ICD-10-CM | POA: Insufficient documentation

## 2016-10-20 DIAGNOSIS — Z66 Do not resuscitate: Secondary | ICD-10-CM | POA: Insufficient documentation

## 2016-10-20 DIAGNOSIS — R0609 Other forms of dyspnea: Secondary | ICD-10-CM | POA: Insufficient documentation

## 2016-10-20 DIAGNOSIS — R000 Tachycardia, unspecified: Secondary | ICD-10-CM | POA: Diagnosis not present

## 2016-10-20 DIAGNOSIS — G2581 Restless legs syndrome: Secondary | ICD-10-CM | POA: Diagnosis not present

## 2016-10-20 DIAGNOSIS — Z85528 Personal history of other malignant neoplasm of kidney: Secondary | ICD-10-CM | POA: Insufficient documentation

## 2016-10-20 DIAGNOSIS — Z7901 Long term (current) use of anticoagulants: Secondary | ICD-10-CM | POA: Insufficient documentation

## 2016-10-20 DIAGNOSIS — I10 Essential (primary) hypertension: Secondary | ICD-10-CM | POA: Diagnosis present

## 2016-10-20 DIAGNOSIS — Z903 Acquired absence of stomach [part of]: Secondary | ICD-10-CM | POA: Insufficient documentation

## 2016-10-20 DIAGNOSIS — G629 Polyneuropathy, unspecified: Secondary | ICD-10-CM | POA: Diagnosis not present

## 2016-10-20 DIAGNOSIS — N183 Chronic kidney disease, stage 3 (moderate): Secondary | ICD-10-CM | POA: Insufficient documentation

## 2016-10-20 DIAGNOSIS — E785 Hyperlipidemia, unspecified: Secondary | ICD-10-CM | POA: Insufficient documentation

## 2016-10-20 DIAGNOSIS — F419 Anxiety disorder, unspecified: Secondary | ICD-10-CM | POA: Insufficient documentation

## 2016-10-20 DIAGNOSIS — I350 Nonrheumatic aortic (valve) stenosis: Secondary | ICD-10-CM | POA: Diagnosis not present

## 2016-10-20 DIAGNOSIS — Z90710 Acquired absence of both cervix and uterus: Secondary | ICD-10-CM | POA: Insufficient documentation

## 2016-10-20 DIAGNOSIS — I69398 Other sequelae of cerebral infarction: Secondary | ICD-10-CM | POA: Insufficient documentation

## 2016-10-20 DIAGNOSIS — Z8572 Personal history of non-Hodgkin lymphomas: Secondary | ICD-10-CM | POA: Insufficient documentation

## 2016-10-20 DIAGNOSIS — I700 Atherosclerosis of aorta: Secondary | ICD-10-CM | POA: Insufficient documentation

## 2016-10-20 DIAGNOSIS — E782 Mixed hyperlipidemia: Secondary | ICD-10-CM | POA: Diagnosis present

## 2016-10-20 DIAGNOSIS — I130 Hypertensive heart and chronic kidney disease with heart failure and stage 1 through stage 4 chronic kidney disease, or unspecified chronic kidney disease: Secondary | ICD-10-CM | POA: Diagnosis not present

## 2016-10-20 DIAGNOSIS — H548 Legal blindness, as defined in USA: Secondary | ICD-10-CM | POA: Insufficient documentation

## 2016-10-20 DIAGNOSIS — I481 Persistent atrial fibrillation: Principal | ICD-10-CM | POA: Insufficient documentation

## 2016-10-20 DIAGNOSIS — I509 Heart failure, unspecified: Secondary | ICD-10-CM | POA: Diagnosis not present

## 2016-10-20 DIAGNOSIS — I4891 Unspecified atrial fibrillation: Secondary | ICD-10-CM

## 2016-10-20 DIAGNOSIS — M5134 Other intervertebral disc degeneration, thoracic region: Secondary | ICD-10-CM | POA: Diagnosis not present

## 2016-10-20 DIAGNOSIS — Z923 Personal history of irradiation: Secondary | ICD-10-CM | POA: Insufficient documentation

## 2016-10-20 DIAGNOSIS — R0789 Other chest pain: Secondary | ICD-10-CM | POA: Diagnosis not present

## 2016-10-20 DIAGNOSIS — R0602 Shortness of breath: Secondary | ICD-10-CM | POA: Diagnosis not present

## 2016-10-20 DIAGNOSIS — Z905 Acquired absence of kidney: Secondary | ICD-10-CM | POA: Insufficient documentation

## 2016-10-20 LAB — BASIC METABOLIC PANEL
ANION GAP: 13 (ref 5–15)
BUN: 8 mg/dL (ref 6–20)
CALCIUM: 9.1 mg/dL (ref 8.9–10.3)
CO2: 21 mmol/L — AB (ref 22–32)
CREATININE: 0.86 mg/dL (ref 0.44–1.00)
Chloride: 111 mmol/L (ref 101–111)
Glucose, Bld: 110 mg/dL — ABNORMAL HIGH (ref 65–99)
Potassium: 3.2 mmol/L — ABNORMAL LOW (ref 3.5–5.1)
SODIUM: 145 mmol/L (ref 135–145)

## 2016-10-20 LAB — CBC
HCT: 40.3 % (ref 36.0–46.0)
HEMOGLOBIN: 13.5 g/dL (ref 12.0–15.0)
MCH: 30.8 pg (ref 26.0–34.0)
MCHC: 33.5 g/dL (ref 30.0–36.0)
MCV: 91.8 fL (ref 78.0–100.0)
PLATELETS: 205 10*3/uL (ref 150–400)
RBC: 4.39 MIL/uL (ref 3.87–5.11)
RDW: 15.6 % — ABNORMAL HIGH (ref 11.5–15.5)
WBC: 7.5 10*3/uL (ref 4.0–10.5)

## 2016-10-20 LAB — I-STAT TROPONIN, ED: TROPONIN I, POC: 0.01 ng/mL (ref 0.00–0.08)

## 2016-10-20 LAB — MAGNESIUM
MAGNESIUM: 1.4 mg/dL — AB (ref 1.7–2.4)
Magnesium: 1.4 mg/dL — ABNORMAL LOW (ref 1.7–2.4)

## 2016-10-20 LAB — PROTIME-INR
INR: 1.96
Prothrombin Time: 22.6 seconds — ABNORMAL HIGH (ref 11.4–15.2)

## 2016-10-20 LAB — MRSA PCR SCREENING: MRSA by PCR: NEGATIVE

## 2016-10-20 LAB — TSH: TSH: 0.719 u[IU]/mL (ref 0.350–4.500)

## 2016-10-20 MED ORDER — SODIUM CHLORIDE 0.9% FLUSH
3.0000 mL | Freq: Two times a day (BID) | INTRAVENOUS | Status: DC
  Administered 2016-10-21 – 2016-10-22 (×3): 3 mL via INTRAVENOUS

## 2016-10-20 MED ORDER — ACETAMINOPHEN 500 MG PO TABS: 500.0000 mg | ORAL_TABLET | Freq: Four times a day (QID) | ORAL | Status: DC | PRN

## 2016-10-20 MED ORDER — SUCRALFATE 1 GM/10ML PO SUSP
1.0000 g | Freq: Three times a day (TID) | ORAL | Status: DC
  Filled 2016-10-20 (×2): qty 10

## 2016-10-20 MED ORDER — SODIUM CHLORIDE 0.9 % IV BOLUS (SEPSIS)
500.0000 mL | Freq: Once | INTRAVENOUS | Status: AC
  Administered 2016-10-20: 500 mL via INTRAVENOUS

## 2016-10-20 MED ORDER — SIMETHICONE 80 MG PO CHEW: 80.0000 mg | CHEWABLE_TABLET | Freq: Four times a day (QID) | ORAL | Status: DC | PRN

## 2016-10-20 MED ORDER — ONDANSETRON HCL 4 MG PO TABS: 4.0000 mg | ORAL_TABLET | Freq: Four times a day (QID) | ORAL | Status: DC | PRN

## 2016-10-20 MED ORDER — ONDANSETRON HCL 4 MG/2ML IJ SOLN: 4.0000 mg | Freq: Four times a day (QID) | INTRAMUSCULAR | Status: DC | PRN

## 2016-10-20 MED ORDER — ACETAMINOPHEN 650 MG RE SUPP: 650.0000 mg | Freq: Four times a day (QID) | RECTAL | Status: DC | PRN

## 2016-10-20 MED ORDER — NITROGLYCERIN 0.4 MG SL SUBL: 0.4000 mg | SUBLINGUAL_TABLET | SUBLINGUAL | Status: DC | PRN

## 2016-10-20 MED ORDER — DILTIAZEM HCL 100 MG IV SOLR
5.0000 mg/h | INTRAVENOUS | Status: DC
  Administered 2016-10-20: 5 mg/h via INTRAVENOUS
  Filled 2016-10-20: qty 100

## 2016-10-20 MED ORDER — FAMOTIDINE 20 MG PO TABS
20.0000 mg | ORAL_TABLET | Freq: Every day | ORAL | Status: DC
  Administered 2016-10-21 – 2016-10-22 (×2): 20 mg via ORAL
  Filled 2016-10-20 (×2): qty 1

## 2016-10-20 MED ORDER — DILTIAZEM LOAD VIA INFUSION
10.0000 mg | Freq: Once | INTRAVENOUS | Status: AC
  Administered 2016-10-20: 10 mg via INTRAVENOUS
  Filled 2016-10-20: qty 10

## 2016-10-20 MED ORDER — SUCRALFATE 1 G PO TABS
1.0000 g | ORAL_TABLET | Freq: Three times a day (TID) | ORAL | Status: DC
  Administered 2016-10-20 – 2016-10-22 (×6): 1 g via ORAL
  Filled 2016-10-20 (×5): qty 1

## 2016-10-20 MED ORDER — ACETAMINOPHEN 325 MG PO TABS: 650.0000 mg | ORAL_TABLET | Freq: Four times a day (QID) | ORAL | Status: DC | PRN

## 2016-10-20 MED ORDER — GABAPENTIN 300 MG PO CAPS
300.0000 mg | ORAL_CAPSULE | Freq: Three times a day (TID) | ORAL | Status: DC
  Administered 2016-10-20 – 2016-10-22 (×5): 300 mg via ORAL
  Filled 2016-10-20 (×5): qty 1

## 2016-10-20 MED ORDER — POTASSIUM CHLORIDE CRYS ER 20 MEQ PO TBCR
40.0000 meq | EXTENDED_RELEASE_TABLET | Freq: Once | ORAL | Status: AC
  Administered 2016-10-20: 40 meq via ORAL
  Filled 2016-10-20: qty 2

## 2016-10-20 MED ORDER — LEVOTHYROXINE SODIUM 100 MCG PO TABS
100.0000 ug | ORAL_TABLET | Freq: Every day | ORAL | Status: DC
  Administered 2016-10-22: 100 ug via ORAL
  Filled 2016-10-20: qty 1

## 2016-10-20 MED ORDER — POTASSIUM CHLORIDE CRYS ER 20 MEQ PO TBCR
40.0000 meq | EXTENDED_RELEASE_TABLET | Freq: Two times a day (BID) | ORAL | Status: AC
  Administered 2016-10-20 – 2016-10-21 (×2): 40 meq via ORAL
  Filled 2016-10-20 (×2): qty 2

## 2016-10-20 MED ORDER — DILTIAZEM HCL 100 MG IV SOLR
5.0000 mg/h | INTRAVENOUS | Status: DC
  Administered 2016-10-20 (×2): 12.5 mg/h via INTRAVENOUS
  Administered 2016-10-21: 10 mg/h via INTRAVENOUS
  Filled 2016-10-20: qty 100

## 2016-10-20 MED ORDER — MAGNESIUM SULFATE 2 GM/50ML IV SOLN
2.0000 g | Freq: Once | INTRAVENOUS | Status: AC
Start: 2016-10-20 — End: 2016-10-20
  Administered 2016-10-20: 2 g via INTRAVENOUS
  Filled 2016-10-20: qty 50

## 2016-10-20 MED ORDER — ALPRAZOLAM 0.25 MG PO TABS: 0.2500 mg | ORAL_TABLET | Freq: Three times a day (TID) | ORAL | Status: DC | PRN

## 2016-10-20 MED ORDER — WARFARIN - PHARMACIST DOSING INPATIENT: Freq: Every day | Status: DC

## 2016-10-20 MED ORDER — TRAZODONE HCL 50 MG PO TABS: 25.0000 mg | ORAL_TABLET | Freq: Every evening | ORAL | Status: DC | PRN

## 2016-10-20 MED ORDER — ASPIRIN EC 81 MG PO TBEC
81.0000 mg | DELAYED_RELEASE_TABLET | Freq: Every day | ORAL | Status: DC
  Administered 2016-10-21: 81 mg via ORAL
  Filled 2016-10-20 (×2): qty 1

## 2016-10-20 MED ORDER — WARFARIN SODIUM 1 MG PO TABS
1.0000 mg | ORAL_TABLET | Freq: Once | ORAL | Status: AC
  Filled 2016-10-20: qty 0.5

## 2016-10-20 NOTE — ED Provider Notes (Signed)
[facility] DEPT Provider Note   CSN: [id number] Arrival date & time: 10/20/16  1142     History   Chief Complaint Chief Complaint  Patient presents with  . Chest Pain  . Shortness of Breath    HPI [name] is a 83 y.o. female.  Patient presents for evaluation of chest discomfort, by EMS. Yesterday, she was weak while walking up stairs. Today, she noticed palpitations with rapid heartbeat, so called EMS for transport to the ED. Recently her PCP advised that she stop taking Cardizem because of some low blood pressure was was found, at home. Her daughters feel like she has lost weight following recent gastrectomy surgery, several months ago. She's been doing well with eating and has not had any recent illnesses. There's been no fever, chills, nausea or vomiting. She's had a mild productive cough in the last 2 days, but it is better today. No recent change in bowel or urinary habits. There are no other known modifying factors.  HPI  Past Medical History:  Diagnosis Date  . Adenocarcinoma of stomach ([place]) 11/11/2015   gastric mass on egd  . Anal fissure   . Anemia    hx 3/17 iron infusion, on aranesp and injected B12 since 11/2015.   [name] Anxiety   . Aortic root dilatation ([place])   . Arthritis   . 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 CAD (coronary artery disease)   . Cancer ([place])    hx Kidney cancer / hx of endometrial cancer  . CHF (congestive heart failure) ([place])   . Chronic kidney disease    only has one kidney rt lft rem 03 ca     dr. Risa Grill  . Elevated LFTs    hepatic steatosis  . Gastroparesis   . GERD (gastroesophageal reflux disease)   . History of uterine cancer 1980s   treated with hysterectomy, external radiation and radiation seed implants.   [name] HTN (hypertension)   . Hyperlipemia   . Hypothyroidism   . Moderate aortic stenosis   . Neuromuscular disorder (HCC)    neuropathy in right leg  . Neuropathy ([place])    feet/legs  . No blood products 02/27/2016  .  Osteomyelitis ([place]) as child  . PAF (paroxysmal atrial fibrillation) ([place])   . PONV (postoperative nausea and vomiting)   . Post-operative nausea and vomiting 06/03/2016  . Presence of permanent cardiac pacemaker   . Radiation proctitis   . Restless leg syndrome   . Stroke [facility]) 15   partial stroke left legally blind  . Tachycardia-bradycardia syndrome [facility])    s/p PPM by Dr Doreatha Lew (MDT) 07/03/10    Patient Active Problem List   Diagnosis Date Noted  . Post-operative nausea and vomiting 06/03/2016  . Long term current use of anticoagulant therapy 03/19/2016  . No blood products 02/27/2016  . S/P partial gastrectomy 02/17/2016  . Post gastrectomy syndrome   . Erosive esophagitis   . Gastroesophageal reflux disease with esophagitis   . Pressure ulcer 02/04/2016  . Persistent atrial fibrillation ([place]) 01/15/2016  . Protein-calorie malnutrition, moderate ([place]) 12/08/2015  . B12 deficiency 11/24/2015  . Iron deficiency anemia due to chronic blood loss 11/24/2015  . Refusal of blood transfusions as patient is Jehovah's Witness   . Adenocarcinoma of stomach ([place]) 11/11/2015  . CKD (chronic kidney disease), stage III 11/10/2015  . Vitamin B12 deficiency 11/09/2015  . Peripheral nerve disease ([place]) 07/18/2015  . Rupture patellar tendon 07/29/2014  . Essential hypertension 06/04/2014  . Medication  management 02/15/2014  . Prediabetes 09/24/2013  . Vitamin D deficiency 09/24/2013  . Hyperlipemia   . Cardiac pacemaker (06/2010) 08/26/2012  . FHx/o Colon Cancer 07/27/2011  . Aortic stenosis, moderate 07/09/2011  . S/P repair of patent ductus arteriosus 04/22/2011  . Atrial fibrillation ([place]) Chadsvasc 5 12/02/2010  . Malignant neoplasm of kidney excluding renal pelvis ([place]) 11/17/2007  . Hypothyroidism 11/17/2007    Past Surgical History:  Procedure Laterality Date  . CARDIAC CATHETERIZATION  2008  . CHOLECYSTECTOMY  1970s  . COLONOSCOPY N/A 11/11/2015   Procedure: COLONOSCOPY;   Surgeon: [name], MD;  Location: [facility] END[facility]COPY;  Service: Endoscopy;  Laterality: N/A;  . ESOPHAGOGASTRODUODENOSCOPY N/A 11/11/2015   Procedure: ESOPHAGOGASTRODUODENOSCOPY (EGD);  Surgeon: [name], MD;  Location: [facility];  Service: Endoscopy;  Laterality: N/A;  . ESOPHAGOGASTRODUODENOSCOPY N/A 02/05/2016   Procedure: ESOPHAGOGASTRODUODENOSCOPY (EGD);  Surgeon: Ladene Artist, MD;  Location: [facility] ENDOSCOPY;  Service: Endoscopy;  Laterality: N/A;  . ESOPHAGOGASTRODUODENOSCOPY N/A 04/02/2016   Procedure: ESOPHAGOGASTRODUODENOSCOPY (EGD);  Surgeon: [name], MD;  Location: [facility];  Service: Endoscopy;  Laterality: N/A;  . GASTRECTOMY N/A 01/15/2016   Procedure: OPEN PARTIAL GASTRECTOMY;  Surgeon: Stark Klein, MD;  Location: [place];  Service: General;  Laterality: N/A;  . GASTRECTOMY Left 02/24/2016   Procedure: OPEN J TUBE;  Surgeon: Stark Klein, MD;  Location: [place];  Service: General;  Laterality: Left;  [name] GASTROJEJUNOSTOMY N/A 05/18/2016   Procedure: ROUX EN Y GASTROJEJUNOSTOMY;  Surgeon: Stark Klein, MD;  Location: [place];  Service: General;  Laterality: N/A;  . I&D KNEE WITH POLY EXCHANGE Right 12/17/2013   Procedure: IRRIGATION AND DEBRIDEMEN RIGHT TOTAL  KNEE WITH POLY EXCHANGE;  Surgeon: Mauri Pole, MD;  Location: [facility];  Service: Orthopedics;  Laterality: Right;  . IR GENERIC HISTORICAL  06/17/2016   IR RADIOLOGIST EVAL & MGMT 06/17/2016 Ascencion Dike, PA-C [facility] INTERV RAD  . LAPAROSCOPY N/A 01/15/2016   Procedure: LAPAROSCOPY DIAGNOSTIC;  Surgeon: Stark Klein, MD;  Location: [place];  Service: General;  Laterality: N/A;  . LYSIS OF ADHESION  05/18/2016   Procedure: LYSIS OF ADHESION;  Surgeon: Stark Klein, MD;  Location: [place];  Service: General;;  . NEPHRECTOMY Left    renal cell cancer  . PACEMAKER INSERTION  07/04/11   MDT PPM implanted by Dr Doreatha Lew for tachy/brady  . PATELLAR TENDON REPAIR Right 03/06/2014   Procedure: AVULSION WITH PATELLA TENDON REPAIR;   Surgeon: Mauri Pole, MD;  [facility];  Service: Orthopedics;  Laterality: Right;  . PATENT DUCTUS ARTERIOUS REPAIR  ~ 1949  . TOTAL ABDOMINAL HYSTERECTOMY  85  . TOTAL HIP REVISION Right 2009   initial replacment surg  . TOTAL KNEE ARTHROPLASTY  02/07/2012   Procedure: TOTAL KNEE ARTHROPLASTY;  Surgeon: Mauri Pole, MD;  [facility];  Service: Orthopedics;  Laterality: Right;  . TOTAL KNEE REVISION Right 07/29/2014   Procedure: RIGHT KNEE REVISION OF PREVIOUS REPAIR EXTENSOR MECHANISM;  Surgeon: Mauri Pole, MD;  [facility];  Service: Orthopedics;  Laterality: Right;  . UPPER GASTROINTESTINAL ENDOSCOPY    . WHIPPLE PROCEDURE N/A 05/18/2016   Procedure: EXPLORATORY LAPAROTOMY;  Surgeon: Stark Klein, MD;  Location: [place];  Service: General;  Laterality: N/A;    OB History    No data available       Home Medications    Prior to Admission medications   Medication Sig Start Date End Date Taking? Authorizing Provider  Acetaminophen (  TYLENOL) 167 MG/5ML LIQD Take 10 mLs by mouth daily as needed (for pain).    Historical Provider, MD  ALPRAZolam (XANAX) 0.5 MG tablet TAKE 1/2-1 TABLET BY MOUTH 3 TIMES A DAY AS NEEDED FOR ANXIETY Patient taking differently: TAKE 1/2-1 TABLET (0.25 - 0.5 mg) BY MOUTH 3 TIMES A DAY AS NEEDED FOR ANXIETY 01/01/16   Courtney Forcucci, PA-C  Cyanocobalamin 1000 MCG/ML KIT Inject 1,000 mcg into the skin as directed. Patient taking differently: Inject 1,000 mcg into the skin every 30 (thirty) days.  11/24/15   Brunetta Genera, MD  diltiazem (CARDIZEM CD) 180 MG 24 hr capsule TAKE 1 CAPSULE (180 MG TOTAL) BY MOUTH DAILY. 08/22/16   Vicie Mutters, PA-C  diltiazem (CARDIZEM CD) 180 MG 24 hr capsule TAKE 1 CAPSULE (180 MG TOTAL) BY MOUTH DAILY. 08/24/16   Courtney Forcucci, PA-C  furosemide (LASIX) 40 MG tablet TAKE 1 TABLET (40 MG TOTAL) BY MOUTH DAILY. 08/24/16   Courtney Forcucci, PA-C  gabapentin (NEURONTIN) 300 MG capsule Take 1 capsule  (300 mg total) by mouth 3 (three) times daily. 06/30/16   [name], MD  levothyroxine (SYNTHROID, LEVOTHROID) 100 MCG tablet TAKE 1/2 TABLET (50 MCG TOTAL) BY MOUTH EVERY DAY ON EMPTY STOMACH WITH A FULL GLASS OF WATER 02/13/16   Mauricio Gerome Apley, MD  metoprolol (LOPRESSOR) 100 MG tablet Take 1 tablet (100 mg total) by mouth 2 (two) times daily. 05/05/16   [name], MD  nitroGLYCERIN (NITROSTAT) 0.4 MG SL tablet Place 1 tablet (0.4 mg total) under the tongue every 5 (five) minutes as needed for chest pain (MAX 3 TABLETS). 05/19/15   Darlin Coco, MD  ondansetron (ZOFRAN-ODT) 8 MG disintegrating tablet Take 1 tablet (8 mg total) by mouth every 8 (eight) hours as needed for nausea. 05/31/16   Stark Klein, MD  oxyCODONE (ROXICODONE) 5 MG/5ML solution Place 5 mg into feeding tube every 4 (four) hours as needed for severe pain.    Historical Provider, MD  pantoprazole (PROTONIX) 40 MG tablet TAKE 1 TABLET BY MOUTH 2 TIMES A DAY 09/19/16   Vicie Mutters, PA-C  pantoprazole (PROTONIX) 40 MG tablet TAKE 1 TABLET BY MOUTH 2 TIMES A DAY 10/06/16   Vicie Mutters, PA-C  potassium chloride (K-DUR) 10 MEQ tablet Take 1 tablet (10 mEq total) by mouth 2 (two) times daily. 08/12/16   Vicie Mutters, PA-C  ranitidine (ZANTAC) 150 MG tablet Take 150 mg by mouth 2 (two) times daily.    Historical Provider, MD  traMADol (ULTRAM) 50 MG tablet Take 1 tablet (50 mg total) by mouth every 6 (six) hours as needed (pain). Reported on 10/14/2015 05/31/16   Stark Klein, MD  triamcinolone cream (KENALOG) 0.5 % Apply 1 application topically 2 (two) times daily. 08/11/16   Vicie Mutters, PA-C  warfarin (COUMADIN) 2 MG tablet TAKE 1 TABLET DAILY OR AS DIRECTED Patient taking differently: Take 2 mg by mouth nightly (Sun/Mon/Tues/Wed/Thurs/Fri/Sat) every day 01/26/16   [name], MD    Family History Family History  Problem Relation Age of Onset  . Colon cancer Mother 4  . Heart disease Mother   . Heart  disease Father   . Heart attack Father   . Heart disease Maternal Grandmother     Social History Social History  Substance Use Topics  . Smoking status: Never Smoker  . Smokeless tobacco: Never Used  . Alcohol use No     Allergies   Adhesive [tape]; Amiodarone; Codeine; Hydrocodone; Morphine and related; Requip [ropinirole hcl];  Zinc; and Reglan [metoclopramide]   Review of Systems Review of Systems  All other systems reviewed and are negative.    Physical Exam Updated Vital Signs BP 129/75   Pulse (!) 149   Temp 97.8 F (36.6 C)   Resp 14   Ht '5\' 2"'$  (1.575 m)   Wt 122 lb (55.3 kg)   SpO2 100%   BMI 22.31 kg/m   Physical Exam  Constitutional: She is oriented to person, place, and time. She appears well-developed.  Elderly, frail  HENT:  Head: Normocephalic and atraumatic.  Eyes: Conjunctivae and EOM are normal. Pupils are equal, round, and reactive to light.  Neck: Normal range of motion and phonation normal. Neck supple.  Cardiovascular:  Irregular tachycardia  Pulmonary/Chest: Effort normal and breath sounds normal. No respiratory distress. She exhibits no tenderness.  Abdominal: Soft. She exhibits no distension. There is no tenderness. There is no guarding.  Musculoskeletal: Normal range of motion.  Neurological: She is alert and oriented to person, place, and time. She exhibits normal muscle tone.  Skin: Skin is warm and dry.  Psychiatric: She has a normal mood and affect. Her behavior is normal. Judgment and thought content normal.  Nursing note and vitals reviewed.    ED Treatments / Results  Labs (all labs ordered are listed, but only abnormal results are displayed) Labs Reviewed  BASIC METABOLIC PANEL - Abnormal; Notable for the following:       Result Value   Potassium 3.2 (*)    CO2 21 (*)    Glucose, Bld 110 (*)    All other components within normal limits  CBC - Abnormal; Notable for the following:    RDW 15.6 (*)    All other components  within normal limits  PROTIME-INR  I-STAT TROPOININ, ED    EKG  EKG Interpretation  Date/Time:  Wednesday October 20 2016 11:57:08 EST Ventricular Rate:  161 PR Interval:    QRS Duration: 78 QT Interval:  264 QTC Calculation: 430 R Axis:   67 Text Interpretation:  Atrial fibrillation with rapid V-rate Repolarization abnormality, prob rate related Since last tracing rate faster Confirmed by Eulis Foster  MD, Delvina Mizzell [phone]) on 10/20/2016 2:23:21 PM        Radiology Dg Chest 2 View  Result Date: 10/20/2016 CLINICAL DATA:  Mid chest pain and shortness of breath associated with lightheadedness today. History of atrial fibrillation, coronary artery disease, and CHF. EXAM: CHEST  2 VIEW COMPARISON:  Portable chest x-ray dated February 18, 2016 FINDINGS: The lungs are mildly hyperinflated. There is no focal infiltrate or pleural effusion. The heart and pulmonary vascularity are normal. The ICD is in stable position. There is calcification in the wall of the thoracic aorta. The mediastinum is normal in width. There is multilevel degenerative disc disease of the thoracic spine. IMPRESSION: Mild hyperinflation consistent with chronic bronchitis. No alveolar pneumonia nor CHF. Thoracic aortic atherosclerosis. Electronically Signed   By: David  [name] M.D.   On: 10/20/2016 12:28    Procedures Procedures (including critical care time)  Medications Ordered in ED Medications  diltiazem (CARDIZEM) 1 mg/mL load via infusion 10 mg (10 mg Intravenous Bolus from Bag 10/20/16 1351)    And  diltiazem (CARDIZEM) 100 mg in dextrose 5 % 100 mL (1 mg/mL) infusion (5 mg/hr Intravenous New Bag/Given 10/20/16 1350)     Initial Impression / Assessment and Plan / ED Course  I have reviewed the triage vital signs and the nursing notes.  Pertinent labs & imaging  results that were available during my care of the patient were reviewed by me and considered in my medical decision making (see chart for details).       CHA2DS2/VAS Stroke Risk Points      7 >= 2 Points: High Risk  1 - 1.99 Points: Medium Risk  0 Points: Low Risk    The previous score was 6 on 08/05/2016.:  Change:         Details    Note: External data might be a factor in metrics not marked with    Points Metrics   This score determines the patient's risk of having a stroke if the  patient has atrial fibrillation.       1 Has Congestive Heart Failure:  Yes   0 Has Vascular Disease:  No   1 Has Hypertension:  Yes   2 Age:  71   0 Has Diabetes:  No   2 Had Stroke:  No Had TIA:  Yes Had thromboembolism:  No   1 Female:  Yes         Medications  diltiazem (CARDIZEM) 1 mg/mL load via infusion 10 mg (10 mg Intravenous Bolus from Bag 10/20/16 1351)    And  diltiazem (CARDIZEM) 100 mg in dextrose 5 % 100 mL (1 mg/mL) infusion (5 mg/hr Intravenous New Bag/Given 10/20/16 1350)    Patient Vitals for the past 24 hrs:  BP Temp Pulse Resp SpO2 Height Weight  10/20/16 1345 129/75 - (!) 149 14 100 % - -  10/20/16 1330 102/76 - (!) 128 (!) 0 100 % - -  10/20/16 1315 119/85 - (!) 131 (!) 0 100 % - -  10/20/16 1300 139/82 - 112 24 100 % - -  10/20/16 1159 - - - - - '5\' 2"'$  (1.575 m) 122 lb (55.3 kg)  10/20/16 1154 119/71 97.8 F (36.6 C) (!) 130 (!) 27 98 % - -    3:05 PM-Consult complete with Hospitalist. Patient case explained and discussed. She agrees to admit patient for further evaluation and treatment. Call ended at 15:15  2:55 PM Reevaluation with update and discussion. After initial assessment and treatment, an updated evaluation reveals she remains fairly comfortable. She continues to be tachycardic, with an irregular rhythm.Daleen Bo L   CRITICAL CARE Performed by: Richarda Blade Total critical care time: 35 minutes Critical care time was exclusive of separately billable procedures and treating other patients. Critical care was necessary to treat or prevent imminent or life-threatening deterioration. Critical care  was time spent personally by me on the following activities: development of treatment plan with patient and/or surrogate as well as nursing, discussions with consultants, evaluation of patient's response to treatment, examination of patient, obtaining history from patient or surrogate, ordering and performing treatments and interventions, ordering and review of laboratory studies, ordering and review of radiographic studies, pulse oximetry and re-evaluation of patient's condition.   Final Clinical Impressions(s) / ED Diagnoses   Final diagnoses:  Atrial fibrillation with RVR (Uncertain)    Intraoperative fibrillation with rapid ventricular rate associated with cessation of Cardizem, following an episode of low blood pressure. Note a serious bacterial infection. Metabolic instability, ACS or impending vascular collapse. She will require admission for further evaluation, and treatment.  Nursing Notes Reviewed/ Care Coordinated, and agree without changes. Applicable Imaging Reviewed.  Interpretation of Laboratory Data incorporated into ED treatment   Plan: Admit  New Prescriptions New Prescriptions   No medications on file     Daleen Bo,  MD 10/20/16 1537

## 2016-10-20 NOTE — Progress Notes (Signed)
Pt with HR maintaining 115-130, BP 125/82.  cardizem titrated to 10mg /hr, per order parameters.  Order for stepdown bed in place, bed requested by secretary.

## 2016-10-20 NOTE — Progress Notes (Signed)
Repot called to nurse on [place]. Getting patient ready to transfer to stepdown. Family is at bedside and is aware of transfer. Patient in bed VSS.

## 2016-10-20 NOTE — ED Triage Notes (Addendum)
[facility] [place] with reported chest tightness. HR fluctuating between 130s-190s. Hx of afib. Given 10 mg IV Cardeziem and two sprays of nitro and 500 ns  by ems.   155/78 150 14 95% RA   Missed dose of Cardizem for 2 days.

## 2016-10-20 NOTE — H&P (Signed)
History and Physical    [name] CWU:[id number] DOB: Jun 24, 1934 DOA: 10/20/2016  PCP: [name], MD  Patient coming from: Home   Chief Complaint: Chest pain, racing heart  HPI: [name] is a 83 y.o. 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 She became progressively more tired the following day and when her daughter checked the blood pressure was found to be low with systolic of 388. The daughter contacted the cardiologist, Dr. [name] and the recommendation was to hold metoprolol and Cardizem and follow up in the office today. Altogether the patient skipped 2 doses of Cardizem and 4 doses of metoprolol and today after she suddenly developed shortness of breath, chest pain and palpitations that she described as a racing heart. The family contacted her cardiologist of this again and was recommended patient be taken to the emergency department for evaluation   ED  Course: On presentation to the ED patient was hypotensive with blood pressure of 102/76 mmHg, heart rate was elevated at 149 bpm and patient reported that at home the heart rate was as high as 190 bpm Work showed hypokalemia with potassium 3.2, and hypomagnesemia with magnesium 1.4, normal CBC Troponin was normal 0.01, EKG showed rapid A. Fib with some ST depression most likely repolarization abnormality  Chest x-ray revealed mild hyperinflation consistent with chronic bronchitis, otherwise no active cardiopulmonary abnormality  Patient was placed on Cardizem drip in the emergency room and had a heart rate and blood pressure significantly improved  Review of Systems: As per HPI otherwise 10 point review of  systems negative.   Ambulatory Status: Independent  Past Medical History:  Diagnosis Date  . 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 Anal fissure   . Anemia    hx 3/17 iron infusion, on aranesp and injected B12 since 11/2015.   [name] Anxiety   . Aortic root dilatation ([place])   . Arthritis   . 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 CAD (coronary artery disease)   . Cancer ([place])    hx Kidney cancer / hx of endometrial cancer  . CHF (congestive heart failure) ([place])   . Chronic kidney disease    only has one kidney rt lft rem 03 ca     dr. Risa Grill  . Elevated LFTs    hepatic steatosis  . Gastroparesis   . GERD (gastroesophageal reflux disease)   . History of uterine cancer 1980s   treated with hysterectomy, external radiation and radiation seed implants.   [name] HTN (hypertension)   . Hyperlipemia   . Hypothyroidism   . Moderate aortic stenosis   . Neuromuscular disorder (HCC)    neuropathy in right leg  . Neuropathy ([place])    feet/legs  . No blood products 02/27/2016  . Osteomyelitis ([place]) as child  . PAF (paroxysmal atrial fibrillation) ([place])   . PONV (postoperative nausea and vomiting)   . Post-operative nausea and vomiting 06/03/2016  . Presence of permanent cardiac pacemaker   . Radiation proctitis   . Restless leg syndrome   . Stroke [facility]) 15   partial stroke left legally blind  . Tachycardia-bradycardia syndrome ([place])    s/p PPM by Dr Doreatha Lew (MDT) 07/03/10    Past Surgical History:  Procedure Laterality Date  . CARDIAC CATHETERIZATION  2008  . CHOLECYSTECTOMY  1970s  . COLONOSCOPY N/A 11/11/2015   Procedure: COLONOSCOPY;  Surgeon: [name], MD;  Location: [facility];  Service: Endoscopy;  Laterality: N/A;  . ESOPHAGOGASTRODUODENOSCOPY N/A 11/11/2015   Procedure: ESOPHAGOGASTRODUODENOSCOPY (EGD);  Surgeon: [name], MD;  Location: [facility];  Service: Endoscopy;  Laterality: N/A;  . ESOPHAGOGASTRODUODENOSCOPY N/A 02/05/2016   Procedure:  ESOPHAGOGASTRODUODENOSCOPY (EGD);  Surgeon: Ladene Artist, MD;  Location: [facility] ENDOSCOPY;  Service: Endoscopy;  Laterality: N/A;  . ESOPHAGOGASTRODUODENOSCOPY N/A 04/02/2016   Procedure: ESOPHAGOGASTRODUODENOSCOPY (EGD);  Surgeon: [name], MD;  Location: [facility];  Service: Endoscopy;  Laterality: N/A;  . GASTRECTOMY N/A 01/15/2016   Procedure: OPEN PARTIAL GASTRECTOMY;  Surgeon: Stark Klein, MD;  Location: [place];  Service: General;  Laterality: N/A;  . GASTRECTOMY Left 02/24/2016   Procedure: OPEN J TUBE;  Surgeon: Stark Klein, MD;  Location: [place];  Service: General;  Laterality: Left;  [name] GASTROJEJUNOSTOMY N/A 05/18/2016   Procedure: ROUX EN Y GASTROJEJUNOSTOMY;  Surgeon: Stark Klein, MD;  Location: [place];  Service: General;  Laterality: N/A;  . I&D KNEE WITH POLY EXCHANGE Right 12/17/2013   Procedure: IRRIGATION AND DEBRIDEMEN RIGHT TOTAL  KNEE WITH POLY EXCHANGE;  Surgeon: Mauri Pole, MD;  [facility];  Service: Orthopedics;  Laterality: Right;  . IR GENERIC HISTORICAL  06/17/2016   IR RADIOLOGIST EVAL & MGMT 06/17/2016 Ascencion Dike, PA-C [facility] INTERV RAD  . LAPAROSCOPY N/A 01/15/2016   Procedure: LAPAROSCOPY DIAGNOSTIC;  Surgeon: Stark Klein, MD;  Location: [place];  Service: General;  Laterality: N/A;  . LYSIS OF ADHESION  05/18/2016   Procedure: LYSIS OF ADHESION;  Surgeon: Stark Klein, MD;  Location: [place];  Service: General;;  . NEPHRECTOMY Left    renal cell cancer  . PACEMAKER INSERTION  07/04/11   MDT PPM implanted by Dr Doreatha Lew for tachy/brady  . PATELLAR TENDON REPAIR Right 03/06/2014   Procedure: AVULSION WITH PATELLA TENDON REPAIR;  Surgeon: Mauri Pole, MD;  [facility];  Service: Orthopedics;  Laterality: Right;  . PATENT DUCTUS ARTERIOUS REPAIR  ~ 1949  . TOTAL ABDOMINAL HYSTERECTOMY  85  . TOTAL HIP REVISION Right 2009   initial replacment surg  . TOTAL KNEE ARTHROPLASTY  02/07/2012   Procedure: TOTAL KNEE ARTHROPLASTY;  Surgeon: Mauri Pole,  MD;  [facility];  Service: Orthopedics;  Laterality: Right;  . TOTAL KNEE REVISION Right 07/29/2014   Procedure: RIGHT KNEE REVISION OF PREVIOUS REPAIR EXTENSOR MECHANISM;  Surgeon: Mauri Pole, MD;  [facility];  Service: Orthopedics;  Laterality: Right;  . UPPER GASTROINTESTINAL ENDOSCOPY    . WHIPPLE PROCEDURE N/A 05/18/2016   Procedure: EXPLORATORY LAPAROTOMY;  Surgeon: Stark Klein, MD;  Location: [place];  Service: General;  Laterality: N/A;    Social History   Social History  . Marital status: Widowed    Spouse name: N/A  . Number of children: N/A  . Years of education: N/A   Occupational History  . Not on file.   Social History Main Topics  . Smoking status: Never Smoker  . Smokeless tobacco: Never Used  . Alcohol use No  . Drug use: No  . Sexual activity: No   Other Topics Concern  . Not on file   Social History Narrative   Lives in [place].  Continues to work.    Allergies  Allergen Reactions  . Adhesive [Tape] Other (See  Comments)    Tears skin, Please use "paper" tape  . Amiodarone Other (See Comments)     Thyroid and liver and kidney problems  . Codeine Nausea And Vomiting  . Hydrocodone Other (See Comments)    hallucination  . Morphine And Related Nausea And Vomiting  . Requip [Ropinirole Hcl] Other (See Comments)    Headache   . Zinc Nausea Only    nausea  . Reglan [Metoclopramide] Hives, Itching and Rash    Made face red, also    Family History  Problem Relation Age of Onset  . Colon cancer Mother 75  . Heart disease Mother   . Heart disease Father   . Heart attack Father   . Heart disease Maternal Grandmother     Prior to Admission medications   Medication Sig Start Date End Date Taking? Authorizing Provider  Acetaminophen (TYLENOL) 167 MG/5ML LIQD Take 10 mLs by mouth daily as needed (for pain).    Historical Provider, MD  ALPRAZolam (XANAX) 0.5 MG tablet TAKE 1/2-1 TABLET BY MOUTH 3 TIMES A DAY AS NEEDED FOR  ANXIETY Patient taking differently: TAKE 1/2-1 TABLET (0.25 - 0.5 mg) BY MOUTH 3 TIMES A DAY AS NEEDED FOR ANXIETY 01/01/16   Courtney Forcucci, PA-C  Cyanocobalamin 1000 MCG/ML KIT Inject 1,000 mcg into the skin as directed. Patient taking differently: Inject 1,000 mcg into the skin every 30 (thirty) days.  11/24/15   Brunetta Genera, MD  diltiazem (CARDIZEM CD) 180 MG 24 hr capsule TAKE 1 CAPSULE (180 MG TOTAL) BY MOUTH DAILY. 08/22/16   Vicie Mutters, PA-C  diltiazem (CARDIZEM CD) 180 MG 24 hr capsule TAKE 1 CAPSULE (180 MG TOTAL) BY MOUTH DAILY. 08/24/16   Courtney Forcucci, PA-C  furosemide (LASIX) 40 MG tablet TAKE 1 TABLET (40 MG TOTAL) BY MOUTH DAILY. 08/24/16   Courtney Forcucci, PA-C  gabapentin (NEURONTIN) 300 MG capsule Take 1 capsule (300 mg total) by mouth 3 (three) times daily. 06/30/16   [name], MD  levothyroxine (SYNTHROID, LEVOTHROID) 100 MCG tablet TAKE 1/2 TABLET (50 MCG TOTAL) BY MOUTH EVERY DAY ON EMPTY STOMACH WITH A FULL GLASS OF WATER 02/13/16   Mauricio Gerome Apley, MD  metoprolol (LOPRESSOR) 100 MG tablet Take 1 tablet (100 mg total) by mouth 2 (two) times daily. 05/05/16   [name], MD  nitroGLYCERIN (NITROSTAT) 0.4 MG SL tablet Place 1 tablet (0.4 mg total) under the tongue every 5 (five) minutes as needed for chest pain (MAX 3 TABLETS). 05/19/15   Darlin Coco, MD  ondansetron (ZOFRAN-ODT) 8 MG disintegrating tablet Take 1 tablet (8 mg total) by mouth every 8 (eight) hours as needed for nausea. 05/31/16   Stark Klein, MD  oxyCODONE (ROXICODONE) 5 MG/5ML solution Place 5 mg into feeding tube every 4 (four) hours as needed for severe pain.    Historical Provider, MD  pantoprazole (PROTONIX) 40 MG tablet TAKE 1 TABLET BY MOUTH 2 TIMES A DAY 09/19/16   Vicie Mutters, PA-C  pantoprazole (PROTONIX) 40 MG tablet TAKE 1 TABLET BY MOUTH 2 TIMES A DAY 10/06/16   Vicie Mutters, PA-C  potassium chloride (K-DUR) 10 MEQ tablet Take 1 tablet (10 mEq total) by mouth  2 (two) times daily. 08/12/16   Vicie Mutters, PA-C  ranitidine (ZANTAC) 150 MG tablet Take 150 mg by mouth 2 (two) times daily.    Historical Provider, MD  traMADol (ULTRAM) 50 MG tablet Take 1 tablet (50 mg total) by mouth every 6 (six) hours as needed (pain). Reported  on 10/14/2015 05/31/16   Stark Klein, MD  triamcinolone cream (KENALOG) 0.5 % Apply 1 application topically 2 (two) times daily. 08/11/16   Vicie Mutters, PA-C  warfarin (COUMADIN) 2 MG tablet TAKE 1 TABLET DAILY OR AS DIRECTED Patient taking differently: Take 2 mg by mouth nightly (Sun/Mon/Tues/Wed/Thurs/Fri/Sat) every day 01/26/16   [name], MD    Physical Exam: Vitals:   10/20/16 1515 10/20/16 1530 10/20/16 1545 10/20/16 1600  BP: 118/87 120/84 132/78 132/72  Pulse: (!) 138 (!) 140 117 (!) 115  Resp: _0 Temp:    98.5 F (36.9 C)  TempSrc:    Oral  SpO2: 99% 100% 99% 99%  Weight:    55.7 kg (122 lb 14.4 oz)  Height:    _1  (1.549 m)     General: Appears calm and comfortable Eyes: PERRLA, EOMI, normal lids, iris ENT:  grossly normal hearing, lips & tongue, mucous membranes moist and intact Neck: no lymphoadenopathy, masses or thyromegaly Cardiovascular: RRR, no m/r/g. No JVD, carotid bruits. No LE edema.  Respiratory: bilateral no wheezes, rales, rhonchi or cracles. Normal respiratory effort. No accessory muscle use observed Abdomen: soft, non-tender, non-distended, no organomegaly or masses appreciated. BS present in all quadrants Skin: no rash, ulcers or induration seen on limited exam Musculoskeletal: grossly normal tone BUE/BLE, good ROM, no bony abnormality or joint deformities observed Psychiatric: grossly normal mood and affect, speech fluent and appropriate, alert and oriented x3 Neurologic: CN II-XII grossly intact, moves all extremities in coordinated fashion, sensation intact  Labs on Admission: I have personally reviewed following labs and imaging studies  CBC, BMP  GFR: Estimated  Creatinine Clearance: 39.4 mL/min (by C-G formula based on SCr of 0.86 mg/dL).   Creatinine Clearance: Estimated Creatinine Clearance: 39.4 mL/min (by C-G formula based on SCr of 0.86 mg/dL).   Radiological Exams on Admission: Dg Chest 2 View  Result Date: 10/20/2016 CLINICAL DATA:  Mid chest pain and shortness of breath associated with lightheadedness today. History of atrial fibrillation, coronary artery disease, and CHF. EXAM: CHEST  2 VIEW COMPARISON:  Portable chest x-ray dated February 18, 2016 FINDINGS: The lungs are mildly hyperinflated. There is no focal infiltrate or pleural effusion. The heart and pulmonary vascularity are normal. The ICD is in stable position. There is calcification in the wall of the thoracic aorta. The mediastinum is normal in width. There is multilevel degenerative disc disease of the thoracic spine. IMPRESSION: Mild hyperinflation consistent with chronic bronchitis. No alveolar pneumonia nor CHF. Thoracic aortic atherosclerosis. Electronically Signed   By: David  [name] M.D.   On: 10/20/2016 12:28    EKG: Independently reviewed - Rapid Afib with some ST depression most likely repolarization abnormality   Assessment/Plan Principal Problem:   Atrial fibrillation with RVR (HCC) Active Problems:   Hypothyroidism   Hyperlipemia   Essential hypertension   Gastroesophageal reflux disease with esophagitis   Chest pain   Hypokalemia   Hypomagnesemia    Atrial fibrillation with RVR Patient presented with chest pain shortness of breath and complaints of racing heart. We'll cycle cardiac enzymes to rule out underlying possible ischemic heart disease She doesn't exhibit signs of fluid volume overload Continue Cardizem drip and transition to oral Cardizem when patient is stable Electrolyte imbalance could be contributing factor as well as poorly controlled hypothroidism  Hypothyroidism Continue Synthroid, check TSH and adjust the doses if needed as it could affect  her hear  Hypokalemia Replace and recheck in the morning  Hypomagnesemia Replace  and recheck hyperlipidemia   Hypertension Presented with very soft BP, will hold home meds and gradually restart when BP is stable  GERD Continue Zantac and Carafate  DVT prophylaxis:on Coumadin Code Status: DNR Family Communication: at bedside Disposition Plan: stepdown Consults called: none Admission status: observation   York Grice, [place] Pager: [phone] [facility]  If 7PM-7AM, please contact night-coverage [email or web address] Password [facility]  10/20/2016, 4:46 PM

## 2016-10-20 NOTE — Progress Notes (Signed)
[place] to PA who entered original orders; with request to change Code Status to DNR, as patient's legal DNR form is present at admission. Preference/status confirmed by family/patient.

## 2016-10-20 NOTE — ED Notes (Signed)
Patient transported to X-ray 

## 2016-10-20 NOTE — ED Notes (Signed)
Provider at bedside

## 2016-10-20 NOTE — ED Notes (Signed)
Placed pt on 2 L nasal cannula for comfort 

## 2016-10-20 NOTE — Progress Notes (Signed)
[place] for coumadin Indication: history of atrial fibrillation and CVA  Allergies  Allergen Reactions  . Adhesive [Tape] Other (See Comments)    Tears skin, Please use "paper" tape  . Amiodarone Other (See Comments)     Thyroid and liver and kidney problems  . Codeine Nausea And Vomiting  . Hydrocodone Other (See Comments)    hallucination  . Morphine And Related Nausea And Vomiting  . Requip [Ropinirole Hcl] Other (See Comments)    Headache   . Zinc Nausea Only    nausea  . Reglan [Metoclopramide] Hives, Itching and Rash    Made face red, also    Patient Measurements: Height: 5\' 1"  (154.9 cm) Weight: 122 lb 14.4 oz (55.7 kg) IBW/kg (Calculated) : 47.8    Vital Signs: Temp: 98.5 F (36.9 C) (02/14 1600) Temp Source: Oral (02/14 1600) BP: 132/72 (02/14 1600) Pulse Rate: 115 (02/14 1600)  Labs:  Recent Labs  10/20/16 1154 10/20/16 1342  HGB 13.5  --   HCT 40.3  --   PLT 205  --   LABPROT  --  22.6*  INR  --  1.96  CREATININE 0.86  --     Estimated Creatinine Clearance: 39.4 mL/min (by C-G formula based on SCr of 0.86 mg/dL).   Medical History: Past Medical History:  Diagnosis Date  . 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 Anal fissure   . Anemia    hx 3/17 iron infusion, on aranesp and injected B12 since 11/2015.   [name] Anxiety   . Aortic root dilatation ([place])   . Arthritis   . 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 CAD (coronary artery disease)   . Cancer ([place])    hx Kidney cancer / hx of endometrial cancer  . CHF (congestive heart failure) ([place])   . Chronic kidney disease    only has one kidney rt lft rem 03 ca     dr. Risa Grill  . Elevated LFTs    hepatic steatosis  . Gastroparesis   . GERD (gastroesophageal reflux disease)   . History of uterine cancer 1980s   treated with hysterectomy, external radiation and radiation seed implants.   [name] HTN (hypertension)   . Hyperlipemia   .  Hypothyroidism   . Moderate aortic stenosis   . Neuromuscular disorder (HCC)    neuropathy in right leg  . Neuropathy ([place])    feet/legs  . No blood products 02/27/2016  . Osteomyelitis ([place]) as child  . PAF (paroxysmal atrial fibrillation) ([place])   . PONV (postoperative nausea and vomiting)   . Post-operative nausea and vomiting 06/03/2016  . Presence of permanent cardiac pacemaker   . Radiation proctitis   . Restless leg syndrome   . Stroke [facility]) 15   partial stroke left legally blind  . Tachycardia-bradycardia syndrome [facility])    s/p PPM by Dr Doreatha Lew (MDT) 07/03/10    Assessment: 82 yo female with history of atrial fibrillation and CVA. INR 1.96 on admission and CBC stable.  Prior to arrival dose is 1 mg on Wednesday and 2 mg all other days per most recent clinic note.   Goal of Therapy:  INR 2-3 Monitor platelets by anticoagulation protocol: Yes   Plan:  1. Coumadin 1 mg PO tonight x 1 at 18:00 2. Daily INR  Vincenza Hews, PharmD, BCPS 10/20/2016, 4:57 PM

## 2016-10-20 NOTE — Progress Notes (Signed)
Pt titrated to 12.5mg /hr, per q 30 min interval order parameter. HR 108-114 BP 137/98

## 2016-10-21 DIAGNOSIS — I4891 Unspecified atrial fibrillation: Secondary | ICD-10-CM

## 2016-10-21 DIAGNOSIS — I10 Essential (primary) hypertension: Secondary | ICD-10-CM

## 2016-10-21 LAB — CBC
HEMATOCRIT: 35.7 % — AB (ref 36.0–46.0)
Hemoglobin: 11.7 g/dL — ABNORMAL LOW (ref 12.0–15.0)
MCH: 30 pg (ref 26.0–34.0)
MCHC: 32.8 g/dL (ref 30.0–36.0)
MCV: 91.5 fL (ref 78.0–100.0)
Platelets: 184 10*3/uL (ref 150–400)
RBC: 3.9 MIL/uL (ref 3.87–5.11)
RDW: 15.4 % (ref 11.5–15.5)
WBC: 6.1 10*3/uL (ref 4.0–10.5)

## 2016-10-21 LAB — BASIC METABOLIC PANEL
ANION GAP: 7 (ref 5–15)
BUN: 7 mg/dL (ref 6–20)
CALCIUM: 8.4 mg/dL — AB (ref 8.9–10.3)
CO2: 23 mmol/L (ref 22–32)
Chloride: 113 mmol/L — ABNORMAL HIGH (ref 101–111)
Creatinine, Ser: 0.72 mg/dL (ref 0.44–1.00)
GFR calc Af Amer: >60 mL/min (ref >60)
GFR calc non Af Amer: >60 mL/min (ref >60)
GLUCOSE: 93 mg/dL (ref 65–99)
POTASSIUM: 3.9 mmol/L (ref 3.5–5.1)
Sodium: 143 mmol/L (ref 135–145)

## 2016-10-21 LAB — MAGNESIUM: MAGNESIUM: 2 mg/dL (ref 1.7–2.4)

## 2016-10-21 LAB — PROTIME-INR
INR: 2.27
Prothrombin Time: 25.4 seconds — ABNORMAL HIGH (ref 11.4–15.2)

## 2016-10-21 MED ORDER — METOPROLOL TARTRATE 50 MG PO TABS
50.0000 mg | ORAL_TABLET | Freq: Two times a day (BID) | ORAL | Status: DC
  Administered 2016-10-21 – 2016-10-22 (×3): 50 mg via ORAL
  Filled 2016-10-21 (×3): qty 1

## 2016-10-21 MED ORDER — ORAL CARE MOUTH RINSE
15.0000 mL | Freq: Two times a day (BID) | OROMUCOSAL | Status: DC
Start: 2016-10-21 — End: 2016-10-21
  Administered 2016-10-21: 15 mL via OROMUCOSAL

## 2016-10-21 MED ORDER — DILTIAZEM HCL ER COATED BEADS 180 MG PO CP24
180.0000 mg | ORAL_CAPSULE | Freq: Every day | ORAL | Status: DC
  Administered 2016-10-21 – 2016-10-22 (×2): 180 mg via ORAL
  Filled 2016-10-21 (×2): qty 1

## 2016-10-21 MED ORDER — LOPERAMIDE HCL 2 MG PO CAPS
2.0000 mg | ORAL_CAPSULE | ORAL | Status: DC | PRN
  Administered 2016-10-21 – 2016-10-22 (×2): 2 mg via ORAL
  Filled 2016-10-21 (×2): qty 1

## 2016-10-21 MED ORDER — WARFARIN SODIUM 2 MG PO TABS
2.0000 mg | ORAL_TABLET | Freq: Once | ORAL | Status: AC
  Administered 2016-10-21: 2 mg via ORAL
  Filled 2016-10-21: qty 1

## 2016-10-21 NOTE — Progress Notes (Signed)
Initial Nutrition Assessment  DOCUMENTATION CODES:   Non-severe (moderate) malnutrition in context of chronic illness  INTERVENTION:   -Snacks TID with meals  NUTRITION DIAGNOSIS:   Malnutrition related to chronic illness as evidenced by mild depletion of muscle mass, mild depletion of body fat, percent weight loss.  GOAL:   Patient will meet greater than or equal to 90% of their needs  MONITOR:   PO intake, Supplement acceptance, Labs, Weight trends, Skin, I & O's  REASON FOR ASSESSMENT:   Malnutrition Screening Tool    ASSESSMENT:   [name] is a 83 y.o. female who presents with CP and found to be in Afib w/ RVR.   Pt admitted with a-fib with RVR.   Spoke with pt and two daughters at bedside. Pt daughters reports that TF was d/c in 07/2016 (per hospital chart G-J tube was removed). Pt now receives nutrition solely by mouth. Per pt daughters, pt does not eat much but has been steadily improving. Pt shares that she has been struggling with transition to consume 6 small meals daily instead of 3 meals per day. Pt has identified a lot of food items that agree and disagree with her; favorite foods include yogurt, ice cream, lactaid milk, cheese toast, and peanut butter toast. Pt was consuming a [place] bar without difficulty at time of visit. Meal completion 75%; pt shares that she was able to tolerate several bites of each food item on her breakfast tray. She still gets nausea when eating, but her daughter suspect that this is an anticipatory response, due to hx of nausea and vomiting with eating in the past.   Both pt and family endorse wt loss. Noted pt has experienced a 13.3% wt loss over the past 6 months, which is significant for time frame.   Nutrition-Focused physical exam completed. Findings are mild to moderate fat depletion, mild to moderatre muscle depletion, and no edema.   RD discussed ways to increase protein intake in diet. Pt has a hx of accepting  supplements very poorly. Will order in between meal nourishments to increase nutritional intake. Pt family is very supportive and often provides pt with favorite food items as well.   Labs reviewed.   Diet Order:  Diet Heart Room service appropriate? Yes; Fluid consistency: Thin  Skin:  Reviewed, no issues (stage I sacrum)  Last BM:  10/21/16  Height:   Ht Readings from Last 1 Encounters:  10/20/16 5\' 1"  (1.549 m)    Weight:   Wt Readings from Last 1 Encounters:  10/21/16 123 lb 10.9 oz (56.1 kg)    Ideal Body Weight:  47.7 kg  BMI:  Body mass index is 23.37 kg/m.  Estimated Nutritional Needs:   Kcal:  1600-1800  Protein:  75-90 grams  Fluid:  1.6-1.8 L  EDUCATION NEEDS:   Education needs addressed  Kaiyla Stahly A. Jimmye Norman, RD, LDN, CDE Pager: [phone] After hours Pager: [phone]

## 2016-10-21 NOTE — Progress Notes (Signed)
Patient ID: [name], female   DOB: 1933/10/06, 83 y.o.   MRN: [id number]    PROGRESS NOTE    [name]  [id number] DOB: [birth year] DOA: 10/20/2016  PCP: [name], MD   Brief Narrative:  83 y.o. 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 She became progressively more tired the following day and when her daughter checked the blood pressure was found to be low with systolic of 101. The daughter contacted the cardiologist, Dr. Jordan and the recommendation was to hold metoprolol and Cardizem and follow up in the office today. Altogether the patient skipped 2 doses of Cardizem and 4 doses of metoprolol and today after she suddenly developed shortness of breath, chest pain and palpitations that she described as a racing heart. The family contacted her cardiologist of this again and was recommended patient be taken to the emergency department for evaluation   Assessment & Plan:   Principal Problem:   Atrial fibrillation with RVR (HCC) - still in a-fib this am with exertional dyspnea - on cardizem drip, on coumadin which will continue for now - cardiology consulted for assistance   Active Problems:   Hypothyroidism - continue synthroid     Essential hypertension - reasonable inpatient control     Gastroesophageal reflux disease with esophagitis - no concerns      Hypomagnesemia and hypokalemia - supplemented and repeat BMP and mg in AM   DVT prophylaxis: on Coumadin Code Status: DNR Family Communication: Patient and wife at bedside  Disposition Plan: home in 2-3   Consultants:   Cardiology  Procedures:   None  Antimicrobials:    None  Subjective: Still with exertional dyspnea.   Objective: Vitals:   10/21/16 0500 10/21/16 0752 10/21/16 0800 10/21/16 1154  BP:  (!) 145/70  114/62  Pulse:  81 81 72  Resp:  16 10 15  Temp:  97.4 F (36.3 C)  97.7 F (36.5 C)  TempSrc:  Oral  Oral  SpO2:  99% 100% 99%  Weight: 56.1 kg (123 lb 10.9 oz)     Height:        Intake/Output Summary (Last 24 hours) at 10/21/16 1319 Last data filed at 10/21/16 1200  Gross per 24 hour  Intake           914.29 ml  Output              95 1 ml  Net           -36.71 ml   Filed Weights   10/20/16 1600 10/20/16 2045 10/21/16 0500  Weight: 55.7 kg (122 lb 14.4 oz) 55.8 kg (123 lb 0.3 oz) 56.1 kg (123 lb 10.9 oz)   Examination:  General exam: Appears calm and comfortable  Respiratory system: mild tachypnea, diminished breath sounds at bases  Cardiovascular system: IRRR. No JVD, murmurs, rubs, gallops or clicks. No pedal edema. Gastrointestinal system: Abdomen is nondistended, soft and nontender. No organomegaly or masses felt.  Central nervous system: Alert and oriented. No focal neurological deficits. Psychiatry: Judgement and insight appear normal. Mood & affect appropriate.    Data Reviewed: I have personally reviewed following labs and imaging studies  CBC:  Recent Labs Lab 10/20/16 1154 10/21/16 0206  WBC 7.5 6.1  HGB  13.5 11.7*  HCT 40.3 35.7*  MCV 91.8 91.5  PLT 205 [id number]   Basic Metabolic Panel:  Recent Labs Lab 10/20/16 1154 10/20/16 1622 10/21/16 0206  NA 145  --  143  K 3.2*  --  3.9  CL 111  --  113*  CO2 21*  --  23  GLUCOSE 110*  --  93  BUN 8  --  7  CREATININE 0.86  --  0.72  CALCIUM 9.1  --  8.4*  MG 1.4* 1.4* 2.0   Coagulation Profile:  Recent Labs Lab 10/15/16 0958 10/20/16 1342 10/21/16 0206  INR 2.1 1.96 2.27   Thyroid Function Tests:  Recent Labs  10/20/16 1622  TSH 0.719   Recent Results (from the past 240 hour(s))  MRSA PCR Screening     Status: None   Collection  Time: 10/20/16  8:42 PM  Result Value Ref Range Status   MRSA by PCR NEGATIVE NEGATIVE Final    Comment:        The GeneXpert MRSA Assay (FDA approved for NASAL specimens only), is one component of a comprehensive MRSA colonization surveillance program. It is not intended to diagnose MRSA infection nor to guide or monitor treatment for MRSA infections.       Radiology Studies: Dg Chest 2 View  Result Date: 10/20/2016 CLINICAL DATA:  Mid chest pain and shortness of breath associated with lightheadedness today. History of atrial fibrillation, coronary artery disease, and CHF. EXAM: CHEST  2 VIEW COMPARISON:  Portable chest x-ray dated February 18, 2016 FINDINGS: The lungs are mildly hyperinflated. There is no focal infiltrate or pleural effusion. The heart and pulmonary vascularity are normal. The ICD is in stable position. There is calcification in the wall of the thoracic aorta. The mediastinum is normal in width. There is multilevel degenerative disc disease of the thoracic spine. IMPRESSION: Mild hyperinflation consistent with chronic bronchitis. No alveolar pneumonia nor CHF. Thoracic aortic atherosclerosis. Electronically Signed   By: David  [name] M.D.   On: 10/20/2016 12:28    Scheduled Meds: . aspirin EC  81 mg Oral Daily  . famotidine  20 mg Oral Daily  . gabapentin  300 mg Oral TID  . levothyroxine  100 mcg Oral QAC breakfast  . mouth rinse  15 mL Mouth Rinse BID  . sodium chloride flush  3 mL Intravenous Q12H  . sucralfate  1 g Oral TID WC & HS  . warfarin  1 mg Oral ONCE-1800  . warfarin  2 mg Oral ONCE-1800  . Warfarin - Pharmacist Dosing Inpatient   Does not apply q1800   Continuous Infusions: . diltiazem (CARDIZEM) infusion 10 mg/hr (10/21/16 1000)     LOS: 0 days   Time spent: 20 minutes   Faye Ramsay, MD [facility] Pager [phone]  If 7PM-7AM, please contact night-coverage [email or web address] Password [email or web address] 10/21/2016, 1:19 PM

## 2016-10-21 NOTE — Consult Note (Signed)
Cardiology Consult    Patient ID: [name] MRN: [id number], DOB/AGE: 03-31-1934   Admit date: 10/20/2016 Date of Consult: 10/21/2016  Primary Physician: [name], MD Reason for Consult: Atrial fibrillation with RVR Primary Cardiologist: Dr [name] Requesting Provider: Dr Doyle Askew   History of Present Illness    [name] is a 83 y.o. female with a past medical history significant for paroxysmal atrial fibrillation on coumadin, tachy-brady syndrome with PPM placed 07/04/2011, remote history of repair of patent ductus arteriosis, moderate AS/moderate AR, CHF, GERD, HTN, HLD, adenocarcinoma of the stomach, and left nephrectomy for renal cell carcinoma who present to the [facility] on 10/20/16 for evaluation of weakness and hypotension. She was found to be in atrial fibrillation with rapid ventricular response rates in the 160's.   She became progressively more tired and when her daughter checked the blood pressure was found to be low with systolic of [id number]. The daughter contacted Dr. [name] who recommended to hold metoprolol and Cardizem and follow up in the office. She continued to feel bad and when she called the office again was instructed to proceed to the hospital.  She was started on IV cardizem and given IV fluids to support blood pressure. She continues on 10mg /hr and continues in atrial fib with rates in the 80's to low 100's. She no longer has chest pressure but still has DOE with minimal exertion like getting to [facility] or even talking.  EKG: Atrial fibrillation with rapid V-rate of 161 bpm, Repolarization abnormality, prob rate related Magnesium was 1.4, supplemented with 2 gm magnesium, Mag level up to 2.0 Potassium was 3.2.   She received KDur 40 mEq X 3 doses. Today K+ 3.9 INR today is 2.27 TSH 0.719 CXR: Mild hyperinflation consistent with chronic bronchitis. No alveolar pneumonia nor CHF. Thoracic aortic atherosclerosis. Troponin 0.01  She is a  Jehovah's witness and refuses blood products.   Last PPM interrogation on 09/03/2016 showed 51.3% AT/AF burden. Next check scheduled for 11/2016.  Echo 11/14/15: EF 60-65%, mild concentric hypertrophy, No regional WMA, mild-mod AS, mod AR, mod MS, mild MR, bilateral atria mildly dilated  Nuclear stress test 11/21/2015: Normal stress nuclear study with no ischemia or infarction; EF 62% with normal wall motion; note patient in atrial flutter at time of study.  Past Medical History   Past Medical History:  Diagnosis Date  . Adenocarcinoma of stomach ([place]) 11/11/2015   gastric mass on egd  . Anal fissure   . Anemia    hx 3/17 iron infusion, on aranesp and injected B12 since 11/2015.   [name] Anxiety   . Aortic root dilatation ([place])   . Arthritis   . 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 CAD (coronary artery disease)   . Cancer ([place])    hx Kidney cancer / hx of endometrial cancer  . CHF (congestive heart failure) ([place])   . Chronic kidney disease    only has one kidney rt lft rem 03 ca     dr. Risa Grill  . Elevated LFTs    hepatic steatosis  . Gastroparesis   . GERD (gastroesophageal reflux disease)   . History of uterine cancer 1980s   treated with hysterectomy, external radiation and radiation seed implants.   [name] HTN (hypertension)   . Hyperlipemia   . Hypothyroidism   . Moderate aortic stenosis   . Neuromuscular disorder (HCC)    neuropathy in right leg  . Neuropathy ([place])    feet/legs  .  No blood products 02/27/2016  . Osteomyelitis ([place]) as child  . PAF (paroxysmal atrial fibrillation) ([place])   . PONV (postoperative nausea and vomiting)   . Post-operative nausea and vomiting 06/03/2016  . Presence of permanent cardiac pacemaker   . Radiation proctitis   . Restless leg syndrome   . Stroke [facility]) 15   partial stroke left legally blind  . Tachycardia-bradycardia syndrome ([place])    s/p PPM by Dr Doreatha Lew (MDT) 07/03/10    Past Surgical History:  Procedure Laterality Date  . CARDIAC CATHETERIZATION   2008  . CHOLECYSTECTOMY  1970s  . COLONOSCOPY N/A 11/11/2015   Procedure: COLONOSCOPY;  Surgeon: [name], MD;  Location: [facility];  Service: Endoscopy;  Laterality: N/A;  . ESOPHAGOGASTRODUODENOSCOPY N/A 11/11/2015   Procedure: ESOPHAGOGASTRODUODENOSCOPY (EGD);  Surgeon: [name], MD;  Location: [facility];  Service: Endoscopy;  Laterality: N/A;  . ESOPHAGOGASTRODUODENOSCOPY N/A 02/05/2016   Procedure: ESOPHAGOGASTRODUODENOSCOPY (EGD);  Surgeon: Ladene Artist, MD;  Location: [facility] ENDOSCOPY;  Service: Endoscopy;  Laterality: N/A;  . ESOPHAGOGASTRODUODENOSCOPY N/A 04/02/2016   Procedure: ESOPHAGOGASTRODUODENOSCOPY (EGD);  Surgeon: [name], MD;  Location: [facility];  Service: Endoscopy;  Laterality: N/A;  . GASTRECTOMY N/A 01/15/2016   Procedure: OPEN PARTIAL GASTRECTOMY;  Surgeon: Stark Klein, MD;  Location: [place];  Service: General;  Laterality: N/A;  . GASTRECTOMY Left 02/24/2016   Procedure: OPEN J TUBE;  Surgeon: Stark Klein, MD;  Location: [place];  Service: General;  Laterality: Left;  [name] GASTROJEJUNOSTOMY N/A 05/18/2016   Procedure: ROUX EN Y GASTROJEJUNOSTOMY;  Surgeon: Stark Klein, MD;  Location: [place];  Service: General;  Laterality: N/A;  . I&D KNEE WITH POLY EXCHANGE Right 12/17/2013   Procedure: IRRIGATION AND DEBRIDEMEN RIGHT TOTAL  KNEE WITH POLY EXCHANGE;  Surgeon: Mauri Pole, MD;  [facility];  Service: Orthopedics;  Laterality: Right;  . IR GENERIC HISTORICAL  06/17/2016   IR RADIOLOGIST EVAL & MGMT 06/17/2016 Ascencion Dike, PA-C [facility] INTERV RAD  . LAPAROSCOPY N/A 01/15/2016   Procedure: LAPAROSCOPY DIAGNOSTIC;  Surgeon: Stark Klein, MD;  Location: [place];  Service: General;  Laterality: N/A;  . LYSIS OF ADHESION  05/18/2016   Procedure: LYSIS OF ADHESION;  Surgeon: Stark Klein, MD;  Location: [place];  Service: General;;  . NEPHRECTOMY Left    renal cell cancer  . PACEMAKER INSERTION  07/04/11   MDT PPM implanted by Dr Doreatha Lew for tachy/brady  .  PATELLAR TENDON REPAIR Right 03/06/2014   Procedure: AVULSION WITH PATELLA TENDON REPAIR;  Surgeon: Mauri Pole, MD;  [facility];  Service: Orthopedics;  Laterality: Right;  . PATENT DUCTUS ARTERIOUS REPAIR  ~ 1949  . TOTAL ABDOMINAL HYSTERECTOMY  85  . TOTAL HIP REVISION Right 2009   initial replacment surg  . TOTAL KNEE ARTHROPLASTY  02/07/2012   Procedure: TOTAL KNEE ARTHROPLASTY;  Surgeon: Mauri Pole, MD;  [facility];  Service: Orthopedics;  Laterality: Right;  . TOTAL KNEE REVISION Right 07/29/2014   Procedure: RIGHT KNEE REVISION OF PREVIOUS REPAIR EXTENSOR MECHANISM;  Surgeon: Mauri Pole, MD;  [facility];  Service: Orthopedics;  Laterality: Right;  . UPPER GASTROINTESTINAL ENDOSCOPY    . WHIPPLE PROCEDURE N/A 05/18/2016   Procedure: EXPLORATORY LAPAROTOMY;  Surgeon: Stark Klein, MD;  Location: [place];  Service: General;  Laterality: N/A;     Allergies  Allergies  Allergen Reactions  . Adhesive [Tape] Other (See Comments)    Tears skin, Please use "paper" tape  .  Amiodarone Other (See Comments)     Thyroid and liver and kidney problems  . Codeine Nausea And Vomiting  . Hydrocodone Other (See Comments)    hallucination  . Morphine And Related Nausea And Vomiting  . Requip [Ropinirole Hcl] Other (See Comments)    Headache   . Zinc Nausea Only    nausea  . Reglan [Metoclopramide] Hives, Itching and Rash    Made face red, also    Inpatient Medications    . aspirin EC  81 mg Oral Daily  . famotidine  20 mg Oral Daily  . gabapentin  300 mg Oral TID  . levothyroxine  100 mcg Oral QAC breakfast  . mouth rinse  15 mL Mouth Rinse BID  . sodium chloride flush  3 mL Intravenous Q12H  . sucralfate  1 g Oral TID WC & HS  . warfarin  1 mg Oral ONCE-1800  . warfarin  2 mg Oral ONCE-1800  . Warfarin - Pharmacist Dosing Inpatient   Does not apply q1800    Family History    Family History  Problem Relation Age of Onset  . Colon cancer Mother 62  .  Heart disease Mother   . Heart disease Father   . Heart attack Father   . Heart disease Maternal Grandmother     Social History    Social History   Social History  . Marital status: Widowed    Spouse name: N/A  . Number of children: N/A  . Years of education: N/A   Occupational History  . Not on file.   Social History Main Topics  . Smoking status: Never Smoker  . Smokeless tobacco: Never Used  . Alcohol use No  . Drug use: No  . Sexual activity: No   Other Topics Concern  . Not on file   Social History Narrative   Lives in [place].  Continues to work.     Review of Systems   General:  No chills, fever, night sweats. Has had weight loss since stomach surgery in 01/2016 Cardiovascular:  Positive for DOE, No chest pain, edema, orthopnea, palpitations, paroxysmal nocturnal dyspnea. Dermatological: No rash, lesions/masses Respiratory: No cough, DOE Urologic: No hematuria, dysuria Abdominal:   No nausea, vomiting, diarrhea, bright red blood per rectum, melena, or hematemesis Neurologic:  No visual changes, wkns, changes in mental status. All other systems reviewed and are otherwise negative except as noted above.  Physical Exam   Blood pressure 114/62, pulse 72, temperature 97.7 F (36.5 C), temperature source Oral, resp. rate 15, height 5\' 1"  (1.549 m), weight 123 lb 10.9 oz (56.1 kg), SpO2 99 %.  General: Pleasant, NAD Psych: Normal affect. Neuro: Alert and oriented X 3. Moves all extremities spontaneously. HEENT: Normal  Neck: Supple without bruits or JVD. Lungs:  Resp regular and unlabored, CTA. Heart: Irregularly irregular rhythm, Soft aortic and mitral murmurs Abdomen: Soft, non-tender, non-distended, BS + x 4.  Extremities: No clubbing, cyanosis or edema. DP/PT/Radials 2+ and equal bilaterally. Right leg is darkened since knee surgery with decreased ROM  Labs    Troponin (Point of Care Test)  Recent Labs  10/20/16 1211  TROPIPOC 0.01   No  results for input(s): CKTOTAL, CKMB, TROPONINI in the last 72 hours. Lab Results  Component Value Date   WBC 6.1 10/21/2016   HGB 11.7 (L) 10/21/2016   HCT 35.7 (L) 10/21/2016   MCV 91.5 10/21/2016   PLT 184 10/21/2016    Recent Labs Lab 10/21/16 0206  NA 143  K 3.9  CL 113*  CO2 23  BUN 7  CREATININE 0.72  CALCIUM 8.4*  GLUCOSE 93   Lab Results  Component Value Date   CHOL 141 08/11/2016   HDL 28 (L) 08/11/2016   LDLCALC 74 08/11/2016   TRIG 197 (H) 08/11/2016   Lab Results  Component Value Date   DDIMER 0.64 (H) 08/03/2014     Radiology Studies    Dg Chest 2 View  Result Date: 10/20/2016 CLINICAL DATA:  Mid chest pain and shortness of breath associated with lightheadedness today. History of atrial fibrillation, coronary artery disease, and CHF. EXAM: CHEST  2 VIEW COMPARISON:  Portable chest x-ray dated February 18, 2016 FINDINGS: The lungs are mildly hyperinflated. There is no focal infiltrate or pleural effusion. The heart and pulmonary vascularity are normal. The ICD is in stable position. There is calcification in the wall of the thoracic aorta. The mediastinum is normal in width. There is multilevel degenerative disc disease of the thoracic spine. IMPRESSION: Mild hyperinflation consistent with chronic bronchitis. No alveolar pneumonia nor CHF. Thoracic aortic atherosclerosis. Electronically Signed   By: David  [name] M.D.   On: 10/20/2016 12:28    EKG & Cardiac Imaging   EKG:  Atrial fibrillation with rapid V-rate of 161 bpm, Repolarization abnormality, prob rate related  Echocardiogram:   Echo 11/14/15: EF 60-65%, mild concentric hypertrophy, No regional WMA, mild-mod AS, mod AR, mod MS, mild MR, bilateral atria mildly dilated   Assessment & Plan    Atrial fibrillation with RVR -Admitted with weakness, fatigue and hypotension. Found to be in afib with RVR -History of PAF on coumadin, INR therapeutic. Has PPM, last interrogation in 08/2016 showed 51.3 % AT/AF  burden. -History of CHF (grade 2 DD 2013), last EF 60-65% (11/2015), does not appear to be fluid overloaded. No CHF on CXR. -Troponin 0.01 -IV cardizem for rate control, at 10 mg/hr, continues to be in atrial fib with rates 80's-low 100's and symptomatic with DOE. Transition to oral dosing with home dose of 180 mg and resume metoprolol at reduced dose- 50 mg for better HR control. Will discuss with Dr. [name]. BP is stable.  -Home meds include metoprolol tartrate 100 mg bid and cardizem 180 mg daily. (Pt has allergy to amiodarone)  Hypertension -Hypotensive on admission. BP improved. SBP 114-145. Will attempt to add beta blocker back at reduced dose.  Hypokalemia -K+ 3.2 on admission, replaced, now 3.9 -Pt was on home lasix 20 mg daily. On hold while here -SCr 0.72 (has solitary kidney)  Hypomagnesemia -Magnesium was 1.4, supplemented with 2 gm magnesium, Mag level up to 2.0 -Pt denies poor po intake although she admits to recent weight loss since abdominal surgery in 01/2016- 22 lbs  Signed, Daune Perch, NP-C 10/21/2016, 12:48 PM Pager: [phone] Patient seen and examined and history reviewed. Agree with above findings and plan. 83 yo WF admitted with AFib with RVR. She has known paroxysmal Afib managed with rate control on metoprolol and diltiazem. She is anticoagulated with coumadin. Earlier this week daughter reports BP being low and she felt weak. Metoprolol and diltiazem were held. Later pulse increased to 170 with symptoms of tachypalpitations. Now feels well on IV diltiazem. Does note some dyspnea on exertion. She has lost weight 23 lbs since cancer surgery  On exam she is a pleasant bright elderly female in NAD No JVD. Lungs are clear.  IRR without gallop or murmur No edema.   Patient has known AFib with  pacemaker in place. Last pacemaker check in December showed Afib burden of 51% with controlled rate. I think we can resume her oral therapy. With recent low BP we will  reduce metoprolol to 50 mg bid. Continue Cardizem 180 mg daily. Stop IV cardizem. Will monitor rate on telemetry.  [name], [place] 10/21/2016 2:45 PM

## 2016-10-21 NOTE — Progress Notes (Signed)
[place] for coumadin Indication: history of atrial fibrillation and CVA  Allergies  Allergen Reactions  . Adhesive [Tape] Other (See Comments)    Tears skin, Please use "paper" tape  . Amiodarone Other (See Comments)     Thyroid and liver and kidney problems  . Codeine Nausea And Vomiting  . Hydrocodone Other (See Comments)    hallucination  . Morphine And Related Nausea And Vomiting  . Requip [Ropinirole Hcl] Other (See Comments)    Headache   . Zinc Nausea Only    nausea  . Reglan [Metoclopramide] Hives, Itching and Rash    Made face red, also    Patient Measurements: Height: 5\' 1"  (154.9 cm) Weight: 123 lb 10.9 oz (56.1 kg) IBW/kg (Calculated) : 47.8    Vital Signs: Temp: 97.4 F (36.3 C) (02/15 0752) Temp Source: Oral (02/15 0752) BP: 145/70 (02/15 0752) Pulse Rate: 81 (02/15 0752)  Labs:  Recent Labs  10/20/16 1154 10/20/16 1342 10/21/16 0206  HGB 13.5  --  11.7*  HCT 40.3  --  35.7*  PLT 205  --  184  LABPROT  --  22.6* 25.4*  INR  --  1.96 2.27  CREATININE 0.86  --  0.72    Estimated Creatinine Clearance: 42.3 mL/min (by C-G formula based on SCr of 0.72 mg/dL).   Medical History: Past Medical History:  Diagnosis Date  . Adenocarcinoma of stomach ([place]) 11/11/2015   gastric mass on egd  . Anal fissure   . Anemia    hx 3/17 iron infusion, on aranesp and injected B12 since 11/2015.   [name] Anxiety   . Aortic root dilatation ([place])   . Arthritis   . 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 CAD (coronary artery disease)   . Cancer ([place])    hx Kidney cancer / hx of endometrial cancer  . CHF (congestive heart failure) ([place])   . Chronic kidney disease    only has one kidney rt lft rem 03 ca     dr. Risa Grill  . Elevated LFTs    hepatic steatosis  . Gastroparesis   . GERD (gastroesophageal reflux disease)   . History of uterine cancer 1980s   treated with hysterectomy, external radiation and radiation seed implants.   [name] HTN  (hypertension)   . Hyperlipemia   . Hypothyroidism   . Moderate aortic stenosis   . Neuromuscular disorder (HCC)    neuropathy in right leg  . Neuropathy ([place])    feet/legs  . No blood products 02/27/2016  . Osteomyelitis ([place]) as child  . PAF (paroxysmal atrial fibrillation) ([place])   . PONV (postoperative nausea and vomiting)   . Post-operative nausea and vomiting 06/03/2016  . Presence of permanent cardiac pacemaker   . Radiation proctitis   . Restless leg syndrome   . Stroke [facility]) 15   partial stroke left legally blind  . Tachycardia-bradycardia syndrome [facility])    s/p PPM by Dr Doreatha Lew (MDT) 07/03/10    Assessment: 83 yo female with history of atrial fibrillation and CVA. She is here with afib/RVR on IV diltiazem. Pharmacy consulted to dose coumadin. -INR= 2.27  Home coumadin dose: 1 mg on Wednesday and 2 mg all other days Goal of Therapy:  INR 2-3 Monitor platelets by anticoagulation protocol: Yes   Plan:  -Coumadin 2mg  po today -Daily PT/INR  Hildred Laser, Pharm D 10/21/2016 8:52 AM

## 2016-10-21 NOTE — Care Management Obs Status (Signed)
MEDICARE OBSERVATION STATUS NOTIFICATION   Patient Details  Name: ELLYCE VESPER MRN: [id number] Date of Birth: 1934/03/08   Medicare Observation Status Notification Given:  Yes    Lacretia Leigh, RN 10/21/2016, 2:46 PM

## 2016-10-22 DIAGNOSIS — I10 Essential (primary) hypertension: Secondary | ICD-10-CM | POA: Diagnosis not present

## 2016-10-22 DIAGNOSIS — I4891 Unspecified atrial fibrillation: Secondary | ICD-10-CM | POA: Diagnosis not present

## 2016-10-22 LAB — BASIC METABOLIC PANEL
ANION GAP: 7 (ref 5–15)
BUN: 7 mg/dL (ref 6–20)
CALCIUM: 9 mg/dL (ref 8.9–10.3)
CHLORIDE: 113 mmol/L — AB (ref 101–111)
CO2: 22 mmol/L (ref 22–32)
CREATININE: 0.86 mg/dL (ref 0.44–1.00)
GFR calc non Af Amer: >60 mL/min (ref >60)
Glucose, Bld: 92 mg/dL (ref 65–99)
Potassium: 4.2 mmol/L (ref 3.5–5.1)
SODIUM: 142 mmol/L (ref 135–145)

## 2016-10-22 LAB — CBC
HCT: 36.6 % (ref 36.0–46.0)
HEMOGLOBIN: 12 g/dL (ref 12.0–15.0)
MCH: 30.4 pg (ref 26.0–34.0)
MCHC: 32.8 g/dL (ref 30.0–36.0)
MCV: 92.7 fL (ref 78.0–100.0)
Platelets: 199 10*3/uL (ref 150–400)
RBC: 3.95 MIL/uL (ref 3.87–5.11)
RDW: 15.9 % — AB (ref 11.5–15.5)
WBC: 7.8 10*3/uL (ref 4.0–10.5)

## 2016-10-22 LAB — PROTIME-INR
INR: 2.45
PROTHROMBIN TIME: 27 s — AB (ref 11.4–15.2)

## 2016-10-22 LAB — MAGNESIUM: MAGNESIUM: 1.7 mg/dL (ref 1.7–2.4)

## 2016-10-22 MED ORDER — WARFARIN SODIUM 2 MG PO TABS: 2.0000 mg | ORAL_TABLET | Freq: Once | ORAL | Status: DC

## 2016-10-22 MED ORDER — METOPROLOL TARTRATE 50 MG PO TABS: 50.0000 mg | ORAL_TABLET | Freq: Two times a day (BID) | ORAL | 0 refills | Status: DC

## 2016-10-22 NOTE — Consult Note (Signed)
   [facility] CM Inpatient Consult   10/22/2016  EMORI ZWIEG Nov 01, 1933 [id number]   Came by to speak with patient for possible restart of services.  Patient has already discharged. Patient was for observation noted  For questions, please contact:  Natividad Brood, RN BSN [facility] Liaison  [phone] business mobile phone Toll free office [phone]

## 2016-10-22 NOTE — Progress Notes (Signed)
[place] for coumadin Indication: history of atrial fibrillation and CVA  Assessment: 83 yo female with history of atrial fibrillation and CVA. She is here with afib/RVR on IV diltiazem. Pharmacy consulted to dose coumadin. -INR= 2.4  Home coumadin dose: 1 mg on Wednesday and 2 mg all other days   Goal of Therapy:  INR 2-3 Monitor platelets by anticoagulation protocol: Yes   Plan:  -Coumadin 2mg  po today and would resume home dose at discharge -Daily PT/INR   Allergies  Allergen Reactions  . Adhesive [Tape] Other (See Comments)    Tears skin, Please use "paper" tape  . Amiodarone Other (See Comments)     Thyroid and liver and kidney problems  . Codeine Nausea And Vomiting  . Hydrocodone Other (See Comments)    hallucination  . Morphine And Related Nausea And Vomiting  . Requip [Ropinirole Hcl] Other (See Comments)    Headache   . Zinc Nausea Only    nausea  . Reglan [Metoclopramide] Hives, Itching and Rash    Made face red, also    Patient Measurements: Height: 5\' 1"  (154.9 cm) Weight: 124 lb 5.4 oz (56.4 kg) IBW/kg (Calculated) : 47.8    Vital Signs: Temp: 98.1 F (36.7 C) (02/16 0700) Temp Source: Oral (02/16 0700) BP: 155/69 (02/16 0905) Pulse Rate: 68 (02/16 0904)  Labs:  Recent Labs  10/20/16 1154 10/20/16 1342 10/21/16 0206 10/22/16 0240  HGB 13.5  --  11.7* 12.0  HCT 40.3  --  35.7* 36.6  PLT 205  --  184 199  LABPROT  --  22.6* 25.4* 27.0*  INR  --  1.96 2.27 2.45  CREATININE 0.86  --  0.72 0.86    Estimated Creatinine Clearance: 39.4 mL/min (by C-G formula based on SCr of 0.86 mg/dL).   Medical History: Past Medical History:  Diagnosis Date  . Adenocarcinoma of stomach ([place]) 11/11/2015   gastric mass on egd  . Anal fissure   . Anemia    hx 3/17 iron infusion, on aranesp and injected B12 since 11/2015.   [name] Anxiety   . Aortic root dilatation ([place])   . Arthritis   . 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  CAD (coronary artery disease)   . Cancer ([place])    hx Kidney cancer / hx of endometrial cancer  . CHF (congestive heart failure) ([place])   . Chronic kidney disease    only has one kidney rt lft rem 03 ca     dr. Risa Grill  . Elevated LFTs    hepatic steatosis  . Gastroparesis   . GERD (gastroesophageal reflux disease)   . History of uterine cancer 1980s   treated with hysterectomy, external radiation and radiation seed implants.   [name] HTN (hypertension)   . Hyperlipemia   . Hypothyroidism   . Moderate aortic stenosis   . Neuromuscular disorder (HCC)    neuropathy in right leg  . Neuropathy ([place])    feet/legs  . No blood products 02/27/2016  . Osteomyelitis ([place]) as child  . PAF (paroxysmal atrial fibrillation) ([place])   . PONV (postoperative nausea and vomiting)   . Post-operative nausea and vomiting 06/03/2016  . Presence of permanent cardiac pacemaker   . Radiation proctitis   . Restless leg syndrome   . Stroke [facility]) 15   partial stroke left legally blind  . Tachycardia-bradycardia syndrome [facility])    s/p PPM by Dr Doreatha Lew (MDT) 07/03/10   Erin Hearing PharmD., BCPS Clinical Pharmacist  Pager [phone] 10/22/2016 10:37 AM

## 2016-10-22 NOTE — Progress Notes (Signed)
Progress Note  Patient Name: [name] Date of Encounter: 10/22/2016  Primary Cardiologist: Ellanie Oppedisano [name] MD  Subjective   Feels great today. Slept really well last night. No CP, palpitations, Dyspnea.  Inpatient Medications    Scheduled Meds: . aspirin EC  81 mg Oral Daily  . diltiazem  180 mg Oral Daily  . famotidine  20 mg Oral Daily  . gabapentin  300 mg Oral TID  . levothyroxine  100 mcg Oral QAC breakfast  . metoprolol tartrate  50 mg Oral BID  . sodium chloride flush  3 mL Intravenous Q12H  . sucralfate  1 g Oral TID WC & HS  . Warfarin - Pharmacist Dosing Inpatient   Does not apply q1800   Continuous Infusions:  PRN Meds: acetaminophen **OR** acetaminophen, ALPRAZolam, loperamide, nitroGLYCERIN, ondansetron **OR** ondansetron (ZOFRAN) IV, simethicone, traZODone   Vital Signs    Vitals:   10/22/16 0400 10/22/16 0500 10/22/16 0600 10/22/16 0700  BP:  (!) 129/57    Pulse: 61 61 61 62  Resp: 15 20 19 12   Temp:      TempSrc:      SpO2: 95% 94% 94% 98%  Weight:   124 lb 5.4 oz (56.4 kg)   Height:        Intake/Output Summary (Last 24 hours) at 10/22/16 0738 Last data filed at 10/21/16 2300  Gross per 24 hour  Intake              900 ml  Output             1652 ml  Net             -752 ml   Filed Weights   10/20/16 2045 10/21/16 0500 10/22/16 0600  Weight: 123 lb 0.3 oz (55.8 kg) 123 lb 10.9 oz (56.1 kg) 124 lb 5.4 oz (56.4 kg)    Telemetry    Atrial paced. Converted to NSR 8:24 last pm - Personally Reviewed  ECG    N/A- Personally Reviewed  Physical Exam   GEN: No acute distress.   Neck: No JVD Cardiac: RRR, no murmurs, rubs, or gallops.  Respiratory: Clear to auscultation bilaterally. GI: Soft, nontender, non-distended  MS: No edema; No deformity. Neuro:  Nonfocal  Psych: Normal affect   Labs    Chemistry Recent Labs Lab 10/20/16 1154 10/21/16 0206 10/22/16 0240  NA 145 143 142  K 3.2* 3.9 4.2  CL 111 113* 113*  CO2 21*  23 22  GLUCOSE 110* 93 92  BUN 8 7 7   CREATININE 0.86 0.72 0.86  CALCIUM 9.1 8.4* 9.0  GFRNONAA >60 >60 >60  GFRAA >60 >60 >60  ANIONGAP 13 7 7      Hematology Recent Labs Lab 10/20/16 1154 10/21/16 0206 10/22/16 0240  WBC 7.5 6.1 7.8  RBC 4.39 3.90 3.95  HGB 13.5 11.7* 12.0  HCT 40.3 35.7* 36.6  MCV 91.8 91.5 92.7  MCH 30.8 30.0 30.4  MCHC 33.5 32.8 32.8  RDW 15.6* 15.4 15.9*  PLT 205 184 199    Cardiac EnzymesNo results for input(s): TROPONINI in the last 168 hours.  Recent Labs Lab 10/20/16 1211  TROPIPOC 0.01     BNPNo results for input(s): BNP, PROBNP in the last 168 hours.   DDimer No results for input(s): DDIMER in the last 168 hours.   Radiology    Dg Chest 2 View  Result Date: 10/20/2016 CLINICAL DATA:  Mid chest pain and shortness of breath associated with  lightheadedness today. History of atrial fibrillation, coronary artery disease, and CHF. EXAM: CHEST  2 VIEW COMPARISON:  Portable chest x-ray dated February 18, 2016 FINDINGS: The lungs are mildly hyperinflated. There is no focal infiltrate or pleural effusion. The heart and pulmonary vascularity are normal. The ICD is in stable position. There is calcification in the wall of the thoracic aorta. The mediastinum is normal in width. There is multilevel degenerative disc disease of the thoracic spine. IMPRESSION: Mild hyperinflation consistent with chronic bronchitis. No alveolar pneumonia nor CHF. Thoracic aortic atherosclerosis. Electronically Signed   By: David  [name] M.D.   On: 10/20/2016 12:28    Cardiac Studies   none  Patient Profile     83 y.o. female admitted with AFib with RVR- symptomatic.  Assessment & Plan    1. Afib with RVR. Known history of paroxysmal Afib. S/p DDD pacer. Rate previously well controlled on Cardizem and metoprolol. Recent low BP noted and these medications were held. Then presented with rapid pulse. Now back on metoprolol at lower dose of 50 mg bid and resumed Cardizem 180  mg daily. She is back in NSR. BP is fine. Coumadin is therapeutic. She is stable for discharge from my standpoint.   Signed, Nylan Nevel [name], MD  10/22/2016, 7:38 AM

## 2016-10-22 NOTE — Progress Notes (Signed)
Discharge instructions given at this time.

## 2016-10-22 NOTE — Discharge Instructions (Signed)
Atrial Fibrillation °Introduction °Atrial fibrillation is a type of heartbeat that is irregular or fast (rapid). If you have this condition, your heart keeps quivering in a weird (chaotic) way. This condition can make it so your heart cannot pump blood normally. Having this condition gives a person more risk for stroke, heart failure, and other heart problems. There are different types of atrial fibrillation. Talk with your doctor to learn about the type that you have. °Follow these instructions at home: °· Take over-the-counter and prescription medicines only as told by your doctor. °· If your doctor prescribed a blood-thinning medicine, take it exactly as told. Taking too much of it can cause bleeding. If you do not take enough of it, you will not have the protection that you need against stroke and other problems. °· Do not use any tobacco products. These include cigarettes, chewing tobacco, and e-cigarettes. If you need help quitting, ask your doctor. °· If you have apnea (obstructive sleep apnea), manage it as told by your doctor. °· Do not drink alcohol. °· Do not drink beverages that have caffeine. These include coffee, soda, and tea. °· Maintain a healthy weight. Do not use diet pills unless your doctor says they are safe for you. Diet pills may make heart problems worse. °· Follow diet instructions as told by your doctor. °· Exercise regularly as told by your doctor. °· Keep all follow-up visits as told by your doctor. This is important. °Contact a doctor if: °· You notice a change in the speed, rhythm, or strength of your heartbeat. °· You are taking a blood-thinning medicine and you notice more bruising. °· You get tired more easily when you move or exercise. °Get help right away if: °· You have pain in your chest or your belly (abdomen). °· You have sweating or weakness. °· You feel sick to your stomach (nauseous). °· You notice blood in your throw up (vomit), poop (stool), or pee (urine). °· You are  short of breath. °· You suddenly have swollen feet and ankles. °· You feel dizzy. °· Your suddenly get weak or numb in your face, arms, or legs, especially if it happens on one side of your body. °· You have trouble talking, trouble understanding, or both. °· Your face or your eyelid droops on one side. °These symptoms may be an emergency. Do not wait to see if the symptoms will go away. Get medical help right away. Call your local emergency services (911 in the [place] Do not drive yourself to the hospital.  °This information is not intended to replace advice given to you by your health care provider. Make sure you discuss any questions you have with your health care provider. °Document Released: 06/01/2008 Document Revised: 01/29/2016 Document Reviewed: 12/18/2014 °© 2017 Elsevier ° °

## 2016-10-22 NOTE — Discharge Summary (Signed)
Physician Discharge Summary  [name]:[id number] DOB: Apr 05, 1934 DOA: 10/20/2016  PCP: [name], MD  Admit date: 10/20/2016 Discharge date: 10/22/2016  Recommendations for Outpatient Follow-up:  1. Pt will need to follow up with PCP in 2-3 weeks post discharge 2. Please obtain BMP to evaluate electrolytes and kidney function, Mg and K levels   Discharge Diagnoses:  Principal Problem:   Atrial fibrillation with RVR (HCC) Active Problems:   Hypothyroidism   Essential hypertension   Hyperlipemia   Gastroesophageal reflux disease with esophagitis   Chest pain   Hypokalemia   Hypomagnesemia   Pressure injury of skin    Discharge Condition: Stable  Diet recommendation: Heart healthy diet discussed in details    Brief Narrative:  83 y.o.femalewith medical history significant of atrial fibrillation on anticoagulation therapy with Coumadin, CHF, status post ICD implantation, hypertension, hyperlipidemia, status post nephrectomy due to renal carcinoma in 2003, 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 She became progressively more tired the following day and when her daughter checked the blood pressure was found to be low with systolic of 250. The daughter contacted the cardiologist,Dr. [name] and the recommendation was to hold metoprolol and Cardizem and follow up in the office today. Altogether the patient skipped 2 doses of Cardizem and 4 doses of metoprolol and today after she suddenly developed shortness of breath, chest pain and palpitations that she described as a racing heart. The family contacted her cardiologist of this again and was recommended patient be taken to the emergency department for evaluation   Assessment & Plan:   Principal Problem:   Atrial fibrillation with RVR ([place]) - now with HR in 60's, wants to gp home, cardiology team cleared for discharge -  please see details medical regimen outline below   Active Problems:   Hypothyroidism - continue synthroid     Essential hypertension - reasonable inpatient control     Gastroesophageal reflux disease with esophagitis - no concerns      Hypomagnesemia and hypokalemia - supplemented   DVT prophylaxis: on Coumadin Code Status: DNR Family Communication: Patient and wife at bedside  Disposition Plan: home   Consultants:   Cardiology  Procedures:   None   Procedures/Studies: Dg Chest 2 View  Result Date: 10/20/2016 CLINICAL DATA:  Mid chest pain and shortness of breath associated with lightheadedness today. History of atrial fibrillation, coronary artery disease, and CHF. EXAM: CHEST  2 VIEW COMPARISON:  Portable chest x-ray dated February 18, 2016 FINDINGS: The lungs are mildly hyperinflated. There is no focal infiltrate or pleural effusion. The heart and pulmonary vascularity are normal. The ICD is in stable position. There is calcification in the wall of the thoracic aorta. The mediastinum is normal in width. There is multilevel degenerative disc disease of the thoracic spine. IMPRESSION: Mild hyperinflation consistent with chronic bronchitis. No alveolar pneumonia nor CHF. Thoracic aortic atherosclerosis. Electronically Signed   By: David  [name] M.D.   On: 10/20/2016 12:28     Discharge Exam: Vitals:   10/22/16 0904 10/22/16 0905  BP:  (!) 155/69  Pulse: 68   Resp:    Temp:     Vitals:   10/22/16 0800 10/22/16 0900 10/22/16 0904 10/22/16 0905  BP: (!) 156/76   (!) 155/69  Pulse: 68 73 68   Resp: 15 (!) 22    Temp:      TempSrc:      SpO2: 98% 100%  Weight:      Height:        General: Pt is alert, follows commands appropriately, not in acute distress Cardiovascular: IRRR, no rubs, no gallops Respiratory: Clear to auscultation bilaterally, no wheezing, no crackles, no rhonchi Abdominal: Soft, non tender, non distended, bowel sounds +, no  guarding Extremities: no edema, no cyanosis, pulses palpable bilaterally DP and PT Neuro: Grossly nonfocal  Discharge Instructions  Discharge Instructions    Diet - low sodium heart healthy    Complete by:  As directed    Increase activity slowly    Complete by:  As directed      Allergies as of 10/22/2016      Reactions   Adhesive [tape] Other (See Comments)   Tears skin, Please use "paper" tape   Amiodarone Other (See Comments)    Thyroid and liver and kidney problems   Codeine Nausea And Vomiting   Hydrocodone Other (See Comments)   hallucination   Morphine And Related Nausea And Vomiting   Requip [ropinirole Hcl] Other (See Comments)   Headache   Zinc Nausea Only   nausea   Reglan [metoclopramide] Hives, Itching, Rash   Made face red, also      Medication List    TAKE these medications   acetaminophen 500 MG tablet Commonly known as:  TYLENOL Take 500 mg by mouth every 6 (six) hours as needed for mild pain.   ALPRAZolam 0.5 MG tablet Commonly known as:  XANAX TAKE 1/2-1 TABLET BY MOUTH 3 TIMES A DAY AS NEEDED FOR ANXIETY What changed:  See the new instructions.   Biotin 24352 MCG Tabs Take 1 tablet by mouth daily.   Cyanocobalamin 1000 MCG/ML Kit Inject 1,000 mcg into the skin as directed. What changed:  when to take this   diltiazem 180 MG 24 hr capsule Commonly known as:  CARDIZEM CD TAKE 1 CAPSULE (180 MG TOTAL) BY MOUTH DAILY.   furosemide 40 MG tablet Commonly known as:  LASIX Take 20 mg by mouth.   gabapentin 300 MG capsule Commonly known as:  NEURONTIN Take 1 capsule (300 mg total) by mouth 3 (three) times daily.   levothyroxine 100 MCG tablet Commonly known as:  SYNTHROID, LEVOTHROID TAKE 1/2 TABLET (50 MCG TOTAL) BY MOUTH EVERY DAY ON EMPTY STOMACH WITH A FULL GLASS OF WATER   metoprolol 50 MG tablet Commonly known as:  LOPRESSOR Take 1 tablet (50 mg total) by mouth 2 (two) times daily. What changed:  medication strength  how much  to take   nitroGLYCERIN 0.4 MG SL tablet Commonly known as:  NITROSTAT Place 1 tablet (0.4 mg total) under the tongue every 5 (five) minutes as needed for chest pain (MAX 3 TABLETS).   potassium chloride 10 MEQ tablet Commonly known as:  K-DUR Take 1 tablet (10 mEq total) by mouth 2 (two) times daily.   ranitidine 150 MG tablet Commonly known as:  ZANTAC Take 150 mg by mouth 2 (two) times daily.   simethicone 80 MG chewable tablet Commonly known as:  MYLICON Chew 80 mg by mouth every 6 (six) hours as needed for flatulence.   sucralfate 1 GM/10ML suspension Commonly known as:  CARAFATE Take 1 g by mouth 4 (four) times daily -  with meals and at bedtime.   warfarin 2 MG tablet Commonly known as:  COUMADIN Take 1-2 mg by mouth daily. Take 1mg  on Wednesday rest of the days take 2mg        Follow-up Information  MCKEOWN,WILLIAM DAVID, MD Follow up.   Specialty:  Internal Medicine Contact information: [address] [place] [address] [phone]        Faye Ramsay, MD Follow up.   Specialty:  Internal Medicine Why:  call my cell phone [phone] Contact information: [address] [place] [address] [phone]            The results of significant diagnostics from this hospitalization (including imaging, microbiology, ancillary and laboratory) are listed below for reference.     Microbiology: Recent Results (from the past 240 hour(s))  MRSA PCR Screening     Status: None   Collection Time: 10/20/16  8:42 PM  Result Value Ref Range Status   MRSA by PCR NEGATIVE NEGATIVE Final    Comment:        The GeneXpert MRSA Assay (FDA approved for NASAL specimens only), is one component of a comprehensive MRSA colonization surveillance program. It is not intended to diagnose MRSA infection nor to guide or monitor treatment for MRSA infections.      Labs: Basic Metabolic Panel:  Recent Labs Lab  10/20/16 1154 10/20/16 1622 10/21/16 0206 10/22/16 0240  NA 145  --  143 142  K 3.2*  --  3.9 4.2  CL 111  --  113* 113*  CO2 21*  --  23 22  GLUCOSE 110*  --  93 92  BUN 8  --  7 7  CREATININE 0.86  --  0.72 0.86  CALCIUM 9.1  --  8.4* 9.0  MG 1.4* 1.4* 2.0 1.7   Liver Function Tests: No results for input(s): AST, ALT, ALKPHOS, BILITOT, PROT, ALBUMIN in the last 168 hours. No results for input(s): LIPASE, AMYLASE in the last 168 hours. No results for input(s): AMMONIA in the last 168 hours. CBC:  Recent Labs Lab 10/20/16 1154 10/21/16 0206 10/22/16 0240  WBC 7.5 6.1 7.8  HGB 13.5 11.7* 12.0  HCT 40.3 35.7* 36.6  MCV 91.8 91.5 92.7  PLT 205 184 199    SIGNED: Time coordinating discharge:  30 minutes  MAGICK-Divonte Senger, MD  [facility] 10/22/2016, 10:41 AM Pager [phone]  If 7PM-7AM, please contact night-coverage [email or web address] Password [email or web address]

## 2016-10-22 NOTE — Evaluation (Signed)
Physical Therapy Evaluation and Discharge Patient Details Name: [name] MRN: [id number] DOB: 06-Nov-1933 Today's Date: 10/22/2016   History of Present Illness  Pt is an 83 y/o female admitted secondary to feeling of heart racing, found to be in a-fib with RVR. PMH including but not limited to CHF, CAD, CKD, hx of CVA, adenocarcinoma of stomach s/p partial gastrectomy in 2017.  Clinical Impression  Pt presented supine in bed with HOB elevated, awake and willing to participate in therapy session. Prior to admission, pt reported that she is mod I with all functional mobility with use of quad cane to ambulate and independent with ADLs. Pt currently at mod I to supervision level for all functional mobility. Pt ambulated on RA, with all VSS throughout. No further acute PT needs identified at this time. PT signing off.     Follow Up Recommendations No PT follow up    Equipment Recommendations  None recommended by PT    Recommendations for Other Services       Precautions / Restrictions Precautions Precautions: Fall Restrictions Weight Bearing Restrictions: No      Mobility  Bed Mobility Overal bed mobility: Modified Independent                Transfers Overall transfer level: Needs assistance Equipment used: None Transfers: Sit to/from Stand Sit to Stand: Supervision         General transfer comment: increased time, supervision for safety  Ambulation/Gait Ambulation/Gait assistance: Supervision Ambulation Distance (Feet): 75 Feet Assistive device: Rolling walker (2 wheeled) Gait Pattern/deviations: Step-through pattern;Decreased step length - right;Decreased step length - left;Decreased stride length Gait velocity: decreased Gait velocity interpretation: Below normal speed for age/gender General Gait Details: no instability or LOB, supervision for safety  Stairs            Wheelchair Mobility    Modified Rankin (Stroke Patients Only)        Balance Overall balance assessment: Needs assistance Sitting-balance support: Feet supported Sitting balance-Leahy Scale: Good     Standing balance support: During functional activity;No upper extremity supported Standing balance-Leahy Scale: Fair                               Pertinent Vitals/Pain Pain Assessment: No/denies pain    Home Living Family/patient expects to be discharged to:: Private residence Living Arrangements: Children Available Help at Discharge: Family;Available 24 hours/day Type of Home: House Home Access: Level entry     Home Layout: Multi-level Home Equipment: Walker - 2 wheels;Cane - quad;Grab bars - toilet;Grab bars - tub/shower;Wheelchair - Insurance claims handler - 4 wheels;Bedside commode      Prior Function Level of Independence: Independent with assistive device(s)         Comments: pt reported that she ambulates with use of quad cane     Hand Dominance   Dominant Hand: Right    Extremity/Trunk Assessment   Upper Extremity Assessment Upper Extremity Assessment: Overall WFL for tasks assessed    Lower Extremity Assessment Lower Extremity Assessment: Generalized weakness       Communication   Communication: HOH  Cognition Arousal/Alertness: Awake/alert Behavior During Therapy: WFL for tasks assessed/performed Overall Cognitive Status: Within Functional Limits for tasks assessed                      General Comments      Exercises     Assessment/Plan    PT Assessment Patent  does not need any further PT services  PT Problem List            PT Treatment Interventions      PT Goals (Current goals can be found in the Care Plan section)  Acute Rehab PT Goals Patient Stated Goal: return home    Frequency     Barriers to discharge        Co-evaluation               End of Session Equipment Utilized During Treatment: Gait belt Activity Tolerance: Patient tolerated treatment  well Patient left: in bed;with call bell/phone within reach Nurse Communication: Mobility status    Functional Assessment Tool Used: clinical judgement Functional Limitation: Mobility: Walking and moving around Mobility: Walking and Moving Around Current Status [id number]): At least 1 percent but less than 20 percent impaired, limited or restricted Mobility: Walking and Moving Around Goal Status [phone]): 0 percent impaired, limited or restricted Mobility: Walking and Moving Around Discharge Status [phone]): At least 1 percent but less than 20 percent impaired, limited or restricted    Time: 0930-0945 PT Time Calculation (min) (ACUTE ONLY): 15 min   Charges:   PT Evaluation $PT Eval Low Complexity: 1 Procedure     PT G Codes:   PT G-Codes **NOT FOR INPATIENT CLASS** Functional Assessment Tool Used: clinical judgement Functional Limitation: Mobility: Walking and moving around Mobility: Walking and Moving Around Current Status [id number]): At least 1 percent but less than 20 percent impaired, limited or restricted Mobility: Walking and Moving Around Goal Status [phone]): 0 percent impaired, limited or restricted Mobility: Walking and Moving Around Discharge Status [phone]): At least 1 percent but less than 20 percent impaired, limited or restricted    [facility] 10/22/2016, 9:50 AM Sherie Don, PT, DPT [phone]

## 2016-10-26 ENCOUNTER — Other Ambulatory Visit: Payer: Self-pay | Admitting: Internal Medicine

## 2016-10-27 ENCOUNTER — Other Ambulatory Visit: Payer: Self-pay | Admitting: *Deleted

## 2016-10-27 MED ORDER — "INSULIN SYRINGE-NEEDLE U-100 27G X 1/2"" 1 ML MISC": 0 refills | Status: DC

## 2016-10-27 MED ORDER — CYANOCOBALAMIN 1000 MCG/ML IJ SOLN: INTRAMUSCULAR | 0 refills | Status: DC

## 2016-10-28 ENCOUNTER — Other Ambulatory Visit: Payer: Self-pay

## 2016-10-28 ENCOUNTER — Encounter: Payer: Self-pay | Admitting: Internal Medicine

## 2016-10-28 MED ORDER — FUROSEMIDE 40 MG PO TABS: 20.0000 mg | ORAL_TABLET | Freq: Every day | ORAL | 0 refills | Status: DC

## 2016-10-28 MED ORDER — DILTIAZEM HCL ER COATED BEADS 180 MG PO CP24: 180.0000 mg | ORAL_CAPSULE | Freq: Every day | ORAL | 0 refills | Status: DC

## 2016-10-29 ENCOUNTER — Ambulatory Visit (INDEPENDENT_AMBULATORY_CARE_PROVIDER_SITE_OTHER): Payer: Medicare Other | Admitting: *Deleted

## 2016-10-29 DIAGNOSIS — I4891 Unspecified atrial fibrillation: Secondary | ICD-10-CM

## 2016-10-29 DIAGNOSIS — Z7901 Long term (current) use of anticoagulants: Secondary | ICD-10-CM | POA: Diagnosis not present

## 2016-10-29 LAB — POCT INR: INR: 1.7

## 2016-11-03 DIAGNOSIS — H4720 Unspecified optic atrophy: Secondary | ICD-10-CM | POA: Diagnosis not present

## 2016-11-03 DIAGNOSIS — Z961 Presence of intraocular lens: Secondary | ICD-10-CM | POA: Diagnosis not present

## 2016-11-03 DIAGNOSIS — H52201 Unspecified astigmatism, right eye: Secondary | ICD-10-CM | POA: Diagnosis not present

## 2016-11-04 ENCOUNTER — Ambulatory Visit (INDEPENDENT_AMBULATORY_CARE_PROVIDER_SITE_OTHER): Payer: Medicare Other | Admitting: Internal Medicine

## 2016-11-04 ENCOUNTER — Encounter: Payer: Self-pay | Admitting: Internal Medicine

## 2016-11-04 VITALS — BP 152/80 | HR 72 | Temp 97.3°F | Resp 16 | Ht 61.0 in | Wt 123.6 lb

## 2016-11-04 DIAGNOSIS — I480 Paroxysmal atrial fibrillation: Secondary | ICD-10-CM | POA: Diagnosis not present

## 2016-11-04 DIAGNOSIS — E876 Hypokalemia: Secondary | ICD-10-CM

## 2016-11-04 DIAGNOSIS — I10 Essential (primary) hypertension: Secondary | ICD-10-CM | POA: Diagnosis not present

## 2016-11-04 DIAGNOSIS — Z79899 Other long term (current) drug therapy: Secondary | ICD-10-CM | POA: Diagnosis not present

## 2016-11-04 LAB — CBC WITH DIFFERENTIAL/PLATELET
Basophils Absolute: 63 cells/uL (ref 0–200)
Basophils Relative: 1 %
Eosinophils Absolute: 252 cells/uL (ref 15–500)
Eosinophils Relative: 4 %
HCT: 43 % (ref 35.0–45.0)
Hemoglobin: 14.2 g/dL (ref 11.7–15.5)
LYMPHS PCT: 36 %
Lymphs Abs: 2268 cells/uL (ref 850–3900)
MCH: 30.9 pg (ref 27.0–33.0)
MCHC: 33 g/dL (ref 32.0–36.0)
MCV: 93.7 fL (ref 80.0–100.0)
MONO ABS: 441 {cells}/uL (ref 200–950)
MPV: 10.2 fL (ref 7.5–12.5)
Monocytes Relative: 7 %
NEUTROS PCT: 52 %
Neutro Abs: 3276 cells/uL (ref 1500–7800)
Platelets: 229 10*3/uL (ref 140–400)
RBC: 4.59 MIL/uL (ref 3.80–5.10)
RDW: 14.8 % (ref 11.0–15.0)
WBC: 6.3 10*3/uL (ref 3.8–10.8)

## 2016-11-04 LAB — BASIC METABOLIC PANEL WITH GFR
BUN: 11 mg/dL (ref 7–25)
CALCIUM: 9.2 mg/dL (ref 8.6–10.4)
CO2: 27 mmol/L (ref 20–31)
CREATININE: 0.98 mg/dL — AB (ref 0.60–0.88)
Chloride: 106 mmol/L (ref 98–110)
GFR, EST AFRICAN AMERICAN: 63 mL/min (ref >=60)
GFR, EST NON AFRICAN AMERICAN: 55 mL/min — AB (ref >=60)
GLUCOSE: 84 mg/dL (ref 65–99)
Potassium: 3.6 mmol/L (ref 3.5–5.3)
Sodium: 143 mmol/L (ref 135–146)

## 2016-11-04 NOTE — Progress Notes (Signed)
[name] [name], M.D.        [name]. [name], P.A.-C       [name], P.A.-C  [facility]                [address] [place] [id number] Telephone [phone] Telefax [phone] ______________________________________________________________________     This very nice 83 y.o. WWF presents for  follow up post recent hospitalization 2/14-16 with Afib w/RVR , relative hypotension, hypokalemia and hypomagnesemia and was treated w/ IV Cardizem and she reverted to NSR. (patient had a similar admission in 2008 with Rapid Afib  and was started on Coumadin at that time).  In the hospital she was also found to have very low Potassium & Magnesium. Post discharge patient has done well and was contacted by clinical staff to schedule follow-up OV.  Patient is also followed  with HTN, ASHD/AoS, Afib on Coumadin (Chadsvasc 5), PPM (2011) , HLD, Pre-Diabetes, Hypothyroidism  and Vitamin D Deficiency. Hospital discharge medications were reconciled with patient and her daughter.      Patient is treated for HTN circa 1960's & BP has been controlled at home. Today's BP is 152/80. Patient has had no complaints of any cardiac type chest pain, palpitations, dyspnea/orthopnea/PND, dizziness, claudication, or dependent edema.     Hyperlipidemia is controlled with diet & meds. Patient denies myalgias or other med SE's. Last Lipids were at goal: Lab Results  Component Value Date   CHOL 141 08/11/2016   HDL 28 (L) 08/11/2016   LDLCALC 74 08/11/2016   TRIG 197 (H) 08/11/2016   CHOLHDL 5.0 (H) 08/11/2016      Also, the patient has history of PreDiabetes/ Insulin Resistance (2012) and has had no symptoms of reactive hypoglycemia, diabetic polys, paresthesias or visual blurring.  Last A1c was at goal: Lab Results  Component Value Date   HGBA1C 5.3 04/21/2016      Further, the patient also has history of  Vitamin D Deficiency and supplements vitamin D without any suspected side-effects. Last vitamin D was at goal: Lab Results  Component Value Date   VD25OH 52 08/11/2016   Current Outpatient Prescriptions on File Prior to Visit  Medication Sig  . acetaminophen  500 MG tablet Take  every 6  hrs as needed   . ALPRAZolam 0.5 MG tablet TAKE 1/2-1 TAB 3 TIMES A DAY AS NEEDED  . Biotin 10000 MCG TABS Take 1 tablet by mouth daily.  [name] VITAMIN B-12  injection Inject 1/2 to 1 ml [place] into the skin 1 time a month.  . diltiazem  CD 180 MG  Take 1 capsule (180 mg total) by mouth daily.  . furosemide  40 MG tablet Take 0.5 tablets (20 mg total) by mouth daily.  [name] gabapentin  300 MG capsule Take 1 capsule (300 mg total) by mouth 3 (three) times daily.  [name] levothyroxine  100 MCG tablet TAKE 1/2 TAB  EVERY DAY   . metoprolol  50 MG tablet Take 1 tablet (50 mg total) by mouth 2 (two) times daily.  [name] NITROSTAT).4 MG SL tablet as needed for chest pain   . K-DUR 10 MEQ tablet Take 1 tablet (10 mEq total) by mouth 2 (two) times daily.  . ranitidine (ZANTAC) 150 MG  Take 150 mg by mouth 2 (two) times daily.  . simethicone  80 MG  Chew  every 6 (six) hours as needed   . sucralfate  1 GM/10ML susp Take 1 g by mouth 4 (four) times daily  . warfarin  2 MG tablet TAKE 1 TABLET DAILY OR AS DIRECTED   Allergen Reactions  . Adhesive [Tape] Tears skin, Please use "paper" tape  . Amiodarone Thyroid and liver and kidney problems  . Codeine Nausea And Vomiting  . Hydrocodone hallucination  . Morphine And Related Nausea And Vomiting  . Requip [Ropinirole Hcl] Headache  . Zinc Nausea Only  . Reglan [Metoclopramide] Hives, Itching and Rash   PMHx:   Past Medical History:  Diagnosis Date  . Adenocarcinoma of stomach ([place]) 11/11/2015   gastric mass on egd  . Anal fissure   . Anemia    hx 3/17 iron infusion, on aranesp and injected B12 since 11/2015.   [name] Anxiety   . Aortic root dilatation ([place])   . Arthritis   . 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 CAD (coronary artery disease)   . Cancer ([place])    hx Kidney cancer / hx of endometrial cancer  . CHF (congestive heart failure) ([place])   . Chronic kidney disease    only has one kidney rt lft rem 03 ca  -Dr. Risa Grill  . Elevated LFTs    hepatic steatosis  . Gastroparesis   . GERD (gastroesophageal reflux disease)   . History of uterine cancer 1980s   treated with hysterectomy, external radiation and radiation seed implants.   [name] HTN (hypertension)   . Hyperlipemia   . Hypothyroidism   . Moderate aortic stenosis   . Neuromuscular disorder (HCC)    neuropathy in right leg  . Neuropathy ([place])    feet/legs  . No blood products 02/27/2016  . Osteomyelitis ([place]) as child  . PAF (paroxysmal atrial fibrillation) ([place])   . PONV (postoperative nausea and vomiting)   . Post-operative nausea and vomiting 06/03/2016  . Presence of permanent cardiac pacemaker   . Radiation proctitis   . Restless leg syndrome   . Stroke [facility]) 15   partial stroke left legally blind  . Tachycardia-bradycardia syndrome ([place])    s/p PPM by Dr Doreatha Lew (MDT) 07/03/10   Immunization History  Administered Date(s) Administered  . Influenza, High Dose Seasonal PF 07/18/2015  . Influenza,inj,Quad PF,36+ Mos 05/31/2014, 06/09/2016  . Influenza-Unspecified 09/16/2009, 07/03/2013  . Pneumococcal Conjugate-13 09/06/2002  . Pneumococcal Polysaccharide-23 07/18/2015  . Td 09/06/2002  . Zoster 09/06/2008   Past Surgical History:  Procedure Laterality Date  . CARDIAC CATHETERIZATION  2008  . CHOLECYSTECTOMY  1970s  . COLONOSCOPY N/A 11/11/2015   Procedure: COLONOSCOPY;  Surgeon: [name], MD;  [facility];  Service: Endoscopy;  Laterality: N/A;  . ESOPHAGOGASTRODUODENOSCOPY N/A 11/11/2015   Procedure: ESOPHAGOGASTRODUODENOSCOPY (EGD);  Surgeon: [name], MD;  [facility];  Service: Endoscopy;  Laterality: N/A;  . ESOPHAGOGASTRODUODENOSCOPY N/A 02/05/2016   Procedure:  ESOPHAGOGASTRODUODENOSCOPY (EGD);  Surgeon: Ladene Artist, MD;  Location[facility] END[facility]COPY;  Service: Endoscopy;  Laterality: N/A;  . ESOPHAGOGASTRODUODENOSCOPY N/A 04/02/2016   Procedure: ESOPHAGOGASTRODUODENOSCOPY (EGD);  Surgeon: [name], MD;  [facility];  Service: Endoscopy;  Laterality: N/A;  . GASTRECTOMY N/A 01/15/2016   Procedure: OPEN PARTIAL GASTRECTOMY;  Surgeon: Stark Klein, MD;  Location: [place];  Service: General;  Laterality: N/A;  . GASTRECTOMY Left 02/24/2016   Procedure: OPEN J TUBE;  Surgeon: Stark Klein, MD;  Location: [place];  Service: General;  Laterality: Left;  [name] GASTROJEJUNOSTOMY  N/A 05/18/2016   Procedure: ROUX EN [place];  Surgeon: Stark Klein, MD;  Location: [place];  Service: General;  Laterality: N/A;  . I&D KNEE WITH POLY EXCHANGE Right 12/17/2013   Procedure: IRRIGATION AND DEBRIDEMEN RIGHT TOTAL  KNEE WITH POLY EXCHANGE;  Surgeon: Mauri Pole, MD;  [facility];  Service: Orthopedics;  Laterality: Right;  . IR GENERIC HISTORICAL  06/17/2016   IR RADIOLOGIST EVAL & MGMT 06/17/2016 Ascencion Dike, PA-C [facility] INTERV RAD  . LAPAROSCOPY N/A 01/15/2016   Procedure: LAPAROSCOPY DIAGNOSTIC;  Surgeon: Stark Klein, MD;  Location: [place];  Service: General;  Laterality: N/A;  . LYSIS OF ADHESION  05/18/2016   Procedure: LYSIS OF ADHESION;  Surgeon: Stark Klein, MD;  Location: [place];  Service: General;;  . NEPHRECTOMY Left    renal cell cancer  . PACEMAKER INSERTION  07/04/11   MDT PPM implanted by Dr Doreatha Lew for tachy/brady  . PATELLAR TENDON REPAIR Right 03/06/2014   Procedure: AVULSION WITH PATELLA TENDON REPAIR;  Surgeon: Mauri Pole, MD;  [facility];  Service: Orthopedics;  Laterality: Right;  . PATENT DUCTUS ARTERIOUS REPAIR  ~ 1949  . TOTAL ABDOMINAL HYSTERECTOMY  85  . TOTAL HIP REVISION Right 2009   initial replacment surg  . TOTAL KNEE ARTHROPLASTY  02/07/2012   Procedure: TOTAL KNEE ARTHROPLASTY;  Surgeon: Mauri Pole,  MD;  [facility];  Service: Orthopedics;  Laterality: Right;  . TOTAL KNEE REVISION Right 07/29/2014   Procedure: RIGHT KNEE REVISION OF PREVIOUS REPAIR EXTENSOR MECHANISM;  Surgeon: Mauri Pole, MD;  [facility];  Service: Orthopedics;  Laterality: Right;  . UPPER GASTROINTESTINAL ENDOSCOPY    . WHIPPLE PROCEDURE N/A 05/18/2016   Procedure: EXPLORATORY LAPAROTOMY;  Surgeon: Stark Klein, MD;  Location: [place];  Service: General;  Laterality: N/A;   FHx:    Reviewed / unchanged  SHx:    Reviewed / unchanged  Systems Review:  Constitutional: Denies fever, chills, wt changes, headaches, insomnia, fatigue, night sweats, change in appetite. Eyes: Denies redness, blurred vision, diplopia, discharge, itchy, watery eyes.  ENT: Denies discharge, congestion, post nasal drip, epistaxis, sore throat, earache, hearing loss, dental pain, tinnitus, vertigo, sinus pain, snoring.  CV: Denies chest pain, palpitations, irregular heartbeat, syncope, dyspnea, diaphoresis, orthopnea, PND, claudication or edema. Respiratory: denies cough, dyspnea, DOE, pleurisy, hoarseness, laryngitis, wheezing.  Gastrointestinal: Denies dysphagia, odynophagia, heartburn, reflux, water brash, abdominal pain or cramps, nausea, vomiting, bloating, diarrhea, constipation, hematemesis, melena, hematochezia  or hemorrhoids. Genitourinary: Denies dysuria, frequency, urgency, nocturia, hesitancy, discharge, hematuria or flank pain. Musculoskeletal: Denies arthralgias, myalgias, stiffness, jt. swelling, pain, limping or strain/sprain.  Skin: Denies pruritus, rash, hives, warts, acne, eczema or change in skin lesion(s). Neuro: No weakness, tremor, incoordination, spasms, paresthesia or pain. Psychiatric: Denies confusion, memory loss or sensory loss. Endo: Denies change in weight, skin or hair change.  Heme/Lymph: No excessive bleeding, bruising or enlarged lymph nodes.  Physical Exam  BP 152/80   P 72   T 97.3 F    R 16    Ht 5\' 1"  )   Wt 123 lb 9.6 oz    BMI 23.35  Appears well nourished and in no distress.  Eyes: PERRLA, EOMs, conjunctiva no swelling or erythema. Sinuses: No frontal/maxillary tenderness ENT/Mouth: EAC's clear, TM's nl w/o erythema, bulging. Nares clear w/o erythema, swelling, exudates. Oropharynx clear without erythema or exudates. Oral hygiene is good. Tongue normal, non obstructing. Hearing intact.  Neck: Supple. Thyroid nl. Car 2+/2+  without bruits, nodes or JVD. Chest: Respirations nl with BS clear & equal w/o rales, rhonchi, wheezing or stridor.  Cor: Heart sounds soft w/ regular rate and rhythm with gr 2-3/4 sys murmur - parasternal and apex.  Peripheral pulses normal and equal  without edema.  Abdomen: Soft & bowel sounds normal. Non-tender w/o guarding, rebound, hernias, masses, or organomegaly.  Lymphatics: Unremarkable.  Musculoskeletal: Full ROM all peripheral extremities, joint stability, 5/5 strength, and normal gait.  Skin: Warm, dry without exposed rashes, lesions or ecchymosis apparent.  Neuro: Cranial nerves intact, reflexes equal bilaterally. Sensory-motor testing grossly intact. Tendon reflexes grossly intact.  Pysch: Alert & oriented x 3.  Insight and judgement nl & appropriate. No ideations.  Assessment and Plan:   1. Paroxysmal atrial fibrillation with rapid ventricular response (HCC)   2. Hypokalemia  - Monitor appropriate labs.  - Continue supplementation.  - BASIC METABOLIC PANEL WITH GFR  3. Hypomagnesemia  - Monitor appropriate labs.  - Continue supplementation. - Magnesium  4. Essential hypertension  - Continue medication, monitor blood pressure at home.  - Continue DASH diet. Reminder to go to the ER if any CP,  SOB, nausea, dizziness, severe HA, changes vision/speech,  left arm numbness and tingling and jaw pain.  5. Medication management  - CBC with Differential/Platelet - BASIC METABOLIC PANEL WITH GFR -  Magnesium       Recommended regular exercise, BP monitoring, weight control, and discussed med and SE's. Recommended labs to assess and monitor clinical status. Further disposition pending results of labs. Over 45 minutes of exam, counseling, chart review was performed

## 2016-11-05 LAB — MAGNESIUM: Magnesium: 1.6 mg/dL (ref 1.5–2.5)

## 2016-11-10 ENCOUNTER — Other Ambulatory Visit: Payer: Self-pay | Admitting: Physician Assistant

## 2016-11-11 ENCOUNTER — Other Ambulatory Visit: Payer: Self-pay | Admitting: *Deleted

## 2016-11-11 MED ORDER — POTASSIUM CHLORIDE ER 10 MEQ PO TBCR: 10.0000 meq | EXTENDED_RELEASE_TABLET | Freq: Two times a day (BID) | ORAL | 1 refills | Status: DC

## 2016-11-12 ENCOUNTER — Ambulatory Visit (INDEPENDENT_AMBULATORY_CARE_PROVIDER_SITE_OTHER): Payer: Medicare Other

## 2016-11-12 DIAGNOSIS — I4891 Unspecified atrial fibrillation: Secondary | ICD-10-CM

## 2016-11-12 DIAGNOSIS — Z7901 Long term (current) use of anticoagulants: Secondary | ICD-10-CM | POA: Diagnosis not present

## 2016-11-12 LAB — POCT INR: INR: 1.9

## 2016-11-17 ENCOUNTER — Encounter: Payer: Self-pay | Admitting: Nurse Practitioner

## 2016-11-24 NOTE — Progress Notes (Signed)
Electrophysiology Office Note Date: 11/25/2016  ID:  [name] Valerie, DOB April 14, 1934, MRN [id number]  PCP: Valerie Richards, MD Primary Cardiologist: Valerie Electrophysiologist: [name]  CC: Pacemaker follow-up  [name] is a 83 y.o. female seen today for Dr [name].  She presents today for routine electrophysiology followup.  Since last being seen in our clinic, the patient reports doing very well.  She has undergone resection of gastric tumor and has been restarted on [place].  She struggles with nausea after eating 2/2 gastric resection and has lost some weight. She is trying to keep her nutrition adequate to avoid a feeding tube.She has not had significant atrial fibrillation since last hospitalization.  She denies chest pain, dyspnea, PND, orthopnea, nausea, vomiting, dizziness, syncope, edema, weight gain, or early satiety.  Device History: MDT dual chamber PPM implanted 2011 for SSS   Past Medical History:  Diagnosis Date  . Adenocarcinoma of stomach ([place]) 11/11/2015   gastric mass on egd  . Anal fissure   . Anemia    hx 3/17 iron infusion, on aranesp and injected B12 since 11/2015.   [name] Anxiety   . Aortic root dilatation ([place])   . Arthritis   . 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 CAD (coronary artery disease)   . Chronic kidney disease    only has one kidney rt lft rem 03 ca     dr. Risa Grill  . Elevated LFTs    hepatic steatosis  . Gastroparesis   . GERD (gastroesophageal reflux disease)   . History of uterine cancer 1980s   treated with hysterectomy, external radiation and radiation seed implants.   [name] HTN (hypertension)   . Hyperlipemia   . Hypothyroidism   . Moderate aortic stenosis   . Neuropathy ([place])    feet/legs  . No blood products 02/27/2016  . Osteomyelitis ([place]) as child  . PAF (paroxysmal atrial fibrillation) ([place])   . Radiation proctitis   . Restless leg syndrome   . Stroke [facility]) 15   partial stroke left legally blind  . Tachycardia-bradycardia syndrome  ([place])    s/p PPM by Dr [name] (MDT) 07/03/10   Past Surgical History:  Procedure Laterality Date  . CARDIAC CATHETERIZATION  2008  . CHOLECYSTECTOMY  1970s  . COLONOSCOPY N/A 11/11/2015   Procedure: COLONOSCOPY;  Surgeon: Valerie Mayer, MD;  [facility];  Service: Endoscopy;  Laterality: N/A;  . ESOPHAGOGASTRODUODENOSCOPY N/A 11/11/2015   Procedure: ESOPHAGOGASTRODUODENOSCOPY (EGD);  Surgeon: Valerie Mayer, MD;  [facility];  Service: Endoscopy;  Laterality: N/A;  . ESOPHAGOGASTRODUODENOSCOPY N/A 02/05/2016   Procedure: ESOPHAGOGASTRODUODENOSCOPY (EGD);  Surgeon: Valerie Artist, MD;  Location: [facility] ENDOSCOPY;  Service: Endoscopy;  Laterality: N/A;  . ESOPHAGOGASTRODUODENOSCOPY N/A 04/02/2016   Procedure: ESOPHAGOGASTRODUODENOSCOPY (EGD);  Surgeon: Valerie Mayer, MD;  [facility];  Service: Endoscopy;  Laterality: N/A;  . GASTRECTOMY N/A 01/15/2016   Procedure: OPEN PARTIAL GASTRECTOMY;  Surgeon: Valerie Klein, MD;  Location: [place];  Service: General;  Laterality: N/A;  . GASTRECTOMY Left 02/24/2016   Procedure: OPEN J TUBE;  Surgeon: Valerie Klein, MD;  Location: [place];  Service: General;  Laterality: Left;  [name] GASTROJEJUNOSTOMY N/A 05/18/2016   Procedure: ROUX EN Y GASTROJEJUNOSTOMY;  Surgeon: Valerie Klein, MD;  Location: [place];  Service: General;  Laterality: N/A;  . I&D KNEE WITH POLY EXCHANGE Right 12/17/2013   Procedure: IRRIGATION AND DEBRIDEMEN RIGHT TOTAL  KNEE WITH POLY EXCHANGE;  Surgeon: Valerie Pole, MD;  [facility];  Service: Orthopedics;  Laterality: Right;  . IR GENERIC HISTORICAL  06/17/2016   IR RADIOLOGIST EVAL & MGMT 06/17/2016 Ascencion Dike, PA-C [facility] INTERV RAD  . LAPAROSCOPY N/A 01/15/2016   Procedure: LAPAROSCOPY DIAGNOSTIC;  Surgeon: Valerie Klein, MD;  Location: [name];  Service: General;  Laterality: N/A;  . LYSIS OF ADHESION  05/18/2016   Procedure: LYSIS OF ADHESION;  Surgeon: Valerie Klein, MD;  Location: [employer];  Service: General;;    . NEPHRECTOMY Left    renal cell cancer  . PACEMAKER INSERTION  07/04/11   MDT PPM implanted by Dr [name] for tachy/brady  . PATELLAR TENDON REPAIR Right 03/06/2014   Procedure: AVULSION WITH PATELLA TENDON REPAIR;  Surgeon: Valerie Pole, MD;  [facility];  Service: Orthopedics;  Laterality: Right;  . PATENT DUCTUS ARTERIOUS REPAIR  ~ 1949  . TOTAL ABDOMINAL HYSTERECTOMY  85  . TOTAL HIP REVISION Right 2009   initial replacment surg  . TOTAL KNEE ARTHROPLASTY  02/07/2012   Procedure: TOTAL KNEE ARTHROPLASTY;  Surgeon: Valerie Pole, MD;  [facility];  Service: Orthopedics;  Laterality: Right;  . TOTAL KNEE REVISION Right 07/29/2014   Procedure: RIGHT KNEE REVISION OF PREVIOUS REPAIR EXTENSOR MECHANISM;  Surgeon: Valerie Pole, MD;  [facility];  Service: Orthopedics;  Laterality: Right;  . WHIPPLE PROCEDURE N/A 05/18/2016   Procedure: EXPLORATORY LAPAROTOMY;  Surgeon: Valerie Klein, MD;  Location: [name];  Service: General;  Laterality: N/A;    Current Outpatient Prescriptions  Medication Sig Dispense Refill  . acetaminophen (TYLENOL) 500 MG tablet Take 500 mg by mouth every 6 (six) hours as needed for mild pain.    [name] ALPRAZolam (XANAX) 0.5 MG tablet TAKE 1/2-1 TABLET BY MOUTH 3 TIMES A DAY AS NEEDED FOR ANXIETY (Patient taking differently: TAKE 1/2-1 TABLET (0.25 - 0.5 mg) BY MOUTH 3 TIMES A DAY AS NEEDED FOR ANXIETY) 90 tablet 0  . Biotin 10000 MCG TABS Take 1 tablet by mouth daily.    . cyanocobalamin (,VITAMIN B-12,) 1000 MCG/ML injection Inject 1/2 to 1 ml [place] into the skin 1 time a month. 10 mL 0  . diltiazem (CARDIZEM CD) 180 MG 24 hr capsule Take 1 capsule (180 mg total) by mouth daily. 90 capsule 0  . furosemide (LASIX) 40 MG tablet Take 0.5 tablets (20 mg total) by mouth daily. 90 tablet 0  . gabapentin (NEURONTIN) 300 MG capsule Take 1 capsule (300 mg total) by mouth 3 (three) times daily. 270 capsule 1  . Insulin Syringe-Needle U-100 (B-D INSULIN SYRINGE 1CC/27G)  27G X 1/2" 1 ML MISC Use for injecting B-12 1 time a month. 100 each 0  . levothyroxine (SYNTHROID, LEVOTHROID) 100 MCG tablet TAKE 1/2 TABLET (50 MCG TOTAL) BY MOUTH EVERY DAY ON EMPTY STOMACH WITH A FULL GLASS OF WATER 90 tablet 1  . metoprolol (LOPRESSOR) 50 MG tablet Take 1 tablet (50 mg total) by mouth 2 (two) times daily. 60 tablet 0  . nitroGLYCERIN (NITROSTAT) 0.4 MG SL tablet Place 1 tablet (0.4 mg total) under the tongue every 5 (five) minutes as needed for chest pain (MAX 3 TABLETS). 25 tablet prn  . potassium chloride (KLOR-CON 10) 10 MEQ tablet Take 1 tablet (10 mEq total) by mouth 2 (two) times daily. 180 tablet 1  . ranitidine (ZANTAC) 150 MG tablet Take 150 mg by mouth 2 (two) times daily.    . simethicone (MYLICON) 80 MG chewable tablet Chew 80 mg by mouth every 6 (  six) hours as needed for flatulence.    . sucralfate (CARAFATE) 1 GM/10ML suspension Take 1 g by mouth 3 (three) times daily.     [name] warfarin (COUMADIN) 2 MG tablet TAKE 1 TABLET DAILY OR AS DIRECTED 100 tablet 1   No current facility-administered medications for this visit.     Allergies:   Adhesive [tape]; Amiodarone; Codeine; Hydrocodone; Morphine and related; Requip [ropinirole hcl]; Zinc; and Reglan [metoclopramide]   Social History: Social History   Social History  . Marital status: Widowed    Spouse name: N/A  . Number of children: N/A  . Years of education: N/A   Occupational History  . Not on file.   Social History Main Topics  . Smoking status: Never Smoker  . Smokeless tobacco: Never Used  . Alcohol use No  . Drug use: No  . Sexual activity: No   Other Topics Concern  . Not on file   Social History Narrative   Lives in [place].  Continues to work.    Family History: Family History  Problem Relation Age of Onset  . Colon cancer Mother 55  . Heart disease Mother   . Heart disease Father   . Heart attack Father   . Heart disease Maternal Grandmother      Review of  Systems: All other systems reviewed and are otherwise negative except as noted above.   Physical Exam: VS:  BP (!) 158/80 (BP Location: Right Arm, Patient Position: Sitting, Cuff Size: Normal)   Pulse 80   Ht 5\' 1"  (1.549 m)   Wt 119 lb (54 kg)   SpO2 98%   BMI 22.48 kg/m  , BMI Body mass index is 22.48 kg/m.  GEN- The patient is elderly and thin appearing, alert and oriented x 3 today.   HEENT: normocephalic, atraumatic; sclera clear, conjunctiva pink; hearing intact; oropharynx clear; neck supple  Lungs- Clear to ausculation bilaterally, normal work of breathing.  No wheezes, rales, rhonchi Heart- Regular rate and rhythm  GI- soft, non-tender, non-distended, bowel sounds present  Extremities- no clubbing, cyanosis, or edema  MS- no significant deformity or atrophy Skin- warm and dry, no rash or lesion; PPM pocket well healed Psych- euthymic mood, full affect Neuro- strength and sensation are intact  PPM Interrogation- reviewed in detail today,  See PACEART report  EKG:  EKG is not ordered today.  Recent Labs: 08/11/2016: ALT 12 10/20/2016: TSH 0.719 11/04/2016: BUN 11; Creat 0.98; Hemoglobin 14.2; Magnesium 1.6; Platelets 229; Potassium 3.6; Sodium 143   Wt Readings from Last 3 Encounters:  11/25/16 119 lb (54 kg)  11/04/16 123 lb 9.6 oz (56.1 kg)  10/22/16 124 lb 5.4 oz (56.4 kg)     Other studies Reviewed: Additional studies/ records that were reviewed today include: Dr [name] and Dr Doug Sou office notes, hospital records  Assessment and Plan:  1.  Tachy/brady syndrome Normal PPM function See Pace Art report No changes today  2.  Persistent atrial fibrillation Burden by device interrogation 23% V rates controlled Continue Warfarin long term for CHADS2VASC of 7 Recent CBC stable We discussed AAD therapy today (I would start with Flecainide), but with improvement in symptoms, she would like to defer for now. She will let us know if she has increased AF burden and  would like to change therapy.    Current medicines are reviewed at length with the patient today.   The patient does not have concerns regarding her medicines.  The following changes were made  today:  none  Labs/ tests ordered today include: none No orders of the defined types were placed in this encounter.   Disposition:   Follow up with [facility], Dr [name] 1 year     Signed, Chanetta Marshall, NP 11/25/2016 11:38 AM  [place] [address] [place] [address] [phone] (office) [phone] (fax

## 2016-11-25 ENCOUNTER — Ambulatory Visit (INDEPENDENT_AMBULATORY_CARE_PROVIDER_SITE_OTHER): Payer: Medicare Other | Admitting: Nurse Practitioner

## 2016-11-25 ENCOUNTER — Ambulatory Visit (INDEPENDENT_AMBULATORY_CARE_PROVIDER_SITE_OTHER): Payer: Medicare Other | Admitting: *Deleted

## 2016-11-25 ENCOUNTER — Encounter: Payer: Self-pay | Admitting: Nurse Practitioner

## 2016-11-25 VITALS — BP 158/80 | HR 80 | Ht 61.0 in | Wt 119.0 lb

## 2016-11-25 DIAGNOSIS — I495 Sick sinus syndrome: Secondary | ICD-10-CM

## 2016-11-25 DIAGNOSIS — I4891 Unspecified atrial fibrillation: Secondary | ICD-10-CM

## 2016-11-25 DIAGNOSIS — I4819 Other persistent atrial fibrillation: Secondary | ICD-10-CM

## 2016-11-25 DIAGNOSIS — I481 Persistent atrial fibrillation: Secondary | ICD-10-CM

## 2016-11-25 DIAGNOSIS — Z7901 Long term (current) use of anticoagulants: Secondary | ICD-10-CM

## 2016-11-25 LAB — CUP PACEART INCLINIC DEVICE CHECK
Date Time Interrogation Session: 20180322124428
Implantable Lead Implant Date: 20111027
Implantable Lead Location: 753860
Implantable Lead Model: 4469
Implantable Lead Model: 4470
Implantable Lead Serial Number: 548229
Implantable Lead Serial Number: 686026
MDC IDC LEAD IMPLANT DT: 20111027
MDC IDC LEAD LOCATION: 753859
MDC IDC PG IMPLANT DT: 20111027

## 2016-11-25 LAB — POCT INR: INR: 2.3

## 2016-11-25 NOTE — Patient Instructions (Signed)
Medication Instructions:    Your physician recommends that you continue on your current medications as directed. Please refer to the Current Medication list given to you today.   If you need a refill on your cardiac medications before your next appointment, please call your pharmacy.  Labwork:  NONE ORDERED  TODAY    Testing/Procedures:  NONE ORDERED  TODAY    Follow-Up:  Your physician wants you to follow-up in: [place] will receive a reminder letter in the mail two months in advance. If you don't receive a letter, please call our office to schedule the follow-up appointment.  Remote monitoring is used to monitor your Pacemaker of ICD from home. This monitoring reduces the number of office visits required to check your device to one time per year. It allows [place] to keep an eye on the functioning of your device to ensure it is working properly. You are scheduled for a device check from home on . 02-28-2017 You may send your transmission at any time that day. If you have a wireless device, the transmission will be sent automatically. After your physician reviews your transmission, you will receive a postcard with your next transmission date.    Any Other Special Instructions Will Be Listed Below (If Applicable).

## 2016-11-26 ENCOUNTER — Encounter: Payer: Self-pay | Admitting: Internal Medicine

## 2016-11-26 ENCOUNTER — Ambulatory Visit (INDEPENDENT_AMBULATORY_CARE_PROVIDER_SITE_OTHER): Payer: Medicare Other | Admitting: Internal Medicine

## 2016-11-26 ENCOUNTER — Other Ambulatory Visit: Payer: Self-pay | Admitting: *Deleted

## 2016-11-26 VITALS — BP 166/80 | HR 72 | Temp 97.5°F | Resp 16 | Ht 61.0 in | Wt 120.0 lb

## 2016-11-26 DIAGNOSIS — R6889 Other general symptoms and signs: Secondary | ICD-10-CM

## 2016-11-26 DIAGNOSIS — K2100 Gastro-esophageal reflux disease with esophagitis, without bleeding: Secondary | ICD-10-CM

## 2016-11-26 DIAGNOSIS — E039 Hypothyroidism, unspecified: Secondary | ICD-10-CM

## 2016-11-26 DIAGNOSIS — Z136 Encounter for screening for cardiovascular disorders: Secondary | ICD-10-CM

## 2016-11-26 DIAGNOSIS — Z1211 Encounter for screening for malignant neoplasm of colon: Secondary | ICD-10-CM

## 2016-11-26 DIAGNOSIS — E782 Mixed hyperlipidemia: Secondary | ICD-10-CM | POA: Diagnosis not present

## 2016-11-26 DIAGNOSIS — Z79899 Other long term (current) drug therapy: Secondary | ICD-10-CM

## 2016-11-26 DIAGNOSIS — E538 Deficiency of other specified B group vitamins: Secondary | ICD-10-CM | POA: Diagnosis not present

## 2016-11-26 DIAGNOSIS — R7303 Prediabetes: Secondary | ICD-10-CM | POA: Diagnosis not present

## 2016-11-26 DIAGNOSIS — I4819 Other persistent atrial fibrillation: Secondary | ICD-10-CM

## 2016-11-26 DIAGNOSIS — E559 Vitamin D deficiency, unspecified: Secondary | ICD-10-CM

## 2016-11-26 DIAGNOSIS — N183 Chronic kidney disease, stage 3 unspecified: Secondary | ICD-10-CM

## 2016-11-26 DIAGNOSIS — Z950 Presence of cardiac pacemaker: Secondary | ICD-10-CM | POA: Diagnosis not present

## 2016-11-26 DIAGNOSIS — I10 Essential (primary) hypertension: Secondary | ICD-10-CM

## 2016-11-26 DIAGNOSIS — D508 Other iron deficiency anemias: Secondary | ICD-10-CM

## 2016-11-26 DIAGNOSIS — Z1212 Encounter for screening for malignant neoplasm of rectum: Secondary | ICD-10-CM

## 2016-11-26 DIAGNOSIS — Z0001 Encounter for general adult medical examination with abnormal findings: Secondary | ICD-10-CM

## 2016-11-26 LAB — CBC WITH DIFFERENTIAL/PLATELET
BASOS ABS: 55 {cells}/uL (ref 0–200)
Basophils Relative: 1 %
EOS PCT: 4 %
Eosinophils Absolute: 220 cells/uL (ref 15–500)
HCT: 41.3 % (ref 35.0–45.0)
Hemoglobin: 13.6 g/dL (ref 11.7–15.5)
Lymphocytes Relative: 26 %
Lymphs Abs: 1430 cells/uL (ref 850–3900)
MCH: 31.4 pg (ref 27.0–33.0)
MCHC: 32.9 g/dL (ref 32.0–36.0)
MCV: 95.4 fL (ref 80.0–100.0)
MONOS PCT: 10 %
MPV: 10.2 fL (ref 7.5–12.5)
Monocytes Absolute: 550 cells/uL (ref 200–950)
NEUTROS ABS: 3245 {cells}/uL (ref 1500–7800)
NEUTROS PCT: 59 %
PLATELETS: 202 10*3/uL (ref 140–400)
RBC: 4.33 MIL/uL (ref 3.80–5.10)
RDW: 14.1 % (ref 11.0–15.0)
WBC: 5.5 10*3/uL (ref 3.8–10.8)

## 2016-11-26 LAB — IRON AND TIBC
%SAT: 30 % (ref 11–50)
Iron: 106 ug/dL (ref 45–160)
TIBC: 348 ug/dL (ref 250–450)
UIBC: 242 ug/dL (ref 125–400)

## 2016-11-26 LAB — BASIC METABOLIC PANEL WITH GFR
BUN: 15 mg/dL (ref 7–25)
CALCIUM: 8.9 mg/dL (ref 8.6–10.4)
CO2: 27 mmol/L (ref 20–31)
CREATININE: 1.02 mg/dL — AB (ref 0.60–0.88)
Chloride: 110 mmol/L (ref 98–110)
GFR, Est African American: 60 mL/min (ref >=60)
GFR, Est Non African American: 52 mL/min — ABNORMAL LOW (ref >=60)
Glucose, Bld: 73 mg/dL (ref 65–99)
Potassium: 3.9 mmol/L (ref 3.5–5.3)
SODIUM: 145 mmol/L (ref 135–146)

## 2016-11-26 LAB — HEPATIC FUNCTION PANEL
ALT: 8 U/L (ref 6–29)
AST: 19 U/L (ref 10–35)
Albumin: 3.8 g/dL (ref 3.6–5.1)
Alkaline Phosphatase: 105 U/L (ref 33–130)
BILIRUBIN DIRECT: 0.1 mg/dL (ref <=0.2)
BILIRUBIN TOTAL: 0.6 mg/dL (ref 0.2–1.2)
Indirect Bilirubin: 0.5 mg/dL (ref 0.2–1.2)
Total Protein: 6.4 g/dL (ref 6.1–8.1)

## 2016-11-26 LAB — LIPID PANEL
Cholesterol: 146 mg/dL (ref <200)
HDL: 32 mg/dL — ABNORMAL LOW (ref >50)
LDL Cholesterol: 82 mg/dL (ref <100)
Total CHOL/HDL Ratio: 4.6 ratio (ref <5.0)
Triglycerides: 162 mg/dL — ABNORMAL HIGH (ref <150)
VLDL: 32 mg/dL — ABNORMAL HIGH (ref <30)

## 2016-11-26 LAB — TSH: TSH: 0.84 m[IU]/L

## 2016-11-26 MED ORDER — DILTIAZEM HCL ER COATED BEADS 180 MG PO CP24: 180.0000 mg | ORAL_CAPSULE | Freq: Every day | ORAL | 1 refills | Status: DC

## 2016-11-26 NOTE — Patient Instructions (Signed)

## 2016-11-26 NOTE — Progress Notes (Signed)
[facility] [name], M.D.    Uvaldo Bristle. Silverio Lay, P.A.-C      Starlyn Skeans, P.A.-C  [facility]                [address] [place] [address] Telephone [phone] Telefax [phone]  Annual Screening/Preventative Visit & Comprehensive Evaluation &  Examination     This very nice 83 y.o. [facility] presents for a Screening/Preventative Visit & comprehensive evaluation and management of multiple medical co-morbidities.  Patient has been followed for HTN, ASHD/pAfib,/AoS, Prediabetes, Hyperlipidemia, Hypothyroidism and Vitamin D Deficiency.     In 2017 she had a Gastric Adenoca resected and later had a Roux-en-Y. Patient is also post TAH for Uterine Ca in 1994 and Left Nephrectomy for transitional cell RCC in 2003.       HTN predates since the 1960's. Patient Hx/o ASHD and AoS and had an episode of pAfib in 2008 and was anticoagulated. In 2011, she had a PPM implanted for Tachy/Brady - SSS. More recently in Feb 2018 she was hospitalized with rAfib reverting with IV Diltiazem.  Patient's BP has been controlled at home and patient denies any cardiac symptoms as chest pain, palpitations, shortness of breath, dizziness or ankle swelling. Today's BP is elevated today at 166/80.      Patient's hyperlipidemia is controlled with diet and medications. Patient denies myalgias or other medication SE's. Last lipids were at goal albeit elevated Trig's: Lab Results  Component Value Date   CHOL 141 08/11/2016   HDL 28 (L) 08/11/2016   LDLCALC 74 08/11/2016   TRIG 197 (H) 08/11/2016   CHOLHDL 5.0 (H) 08/11/2016      Patient has prediabetes/Insulin Resistance (2012) and patient denies reactive hypoglycemic symptoms, visual blurring, diabetic polys, or paresthesias. Last A1c was at goal: Lab Results  Component Value Date   HGBA1C 5.3 04/21/2016      Patient has been on Thyroid replacement since the 1990's.  Finally, patient has history of Vitamin D Deficiency ("19" in 2008) and last Vitamin D was near goal (70-100): Lab Results  Component Value Date   VD25OH 52 08/11/2016   Current Outpatient Prescriptions on File Prior to Visit  Medication Sig  . acetaminophen (TYLENOL) 500 MG tablet Take 500 mg by mouth every 6 (six) hours as needed for mild pain.  [name] ALPRAZolam (XANAX) 0.5 MG tablet TAKE 1/2-1 TABLET BY MOUTH 3 TIMES A DAY AS NEEDED FOR ANXIETY (Patient taking differently: TAKE 1/2-1 TABLET (0.25 - 0.5 mg) BY MOUTH 3 TIMES A DAY AS NEEDED FOR ANXIETY)  . Biotin 10000 MCG TABS Take 1 tablet by mouth daily.  . cyanocobalamin (,VITAMIN B-12,) 1000 MCG/ML injection Inject 1/2 to 1 ml [place] into the skin 1 time a month.  . furosemide (LASIX) 40 MG tablet Take 0.5 tablets (20 mg total) by mouth daily.  [name] gabapentin (NEURONTIN) 300 MG capsule Take 1 capsule (300 mg total) by mouth 3 (three) times daily.  . Insulin Syringe-Needle U-100 (B-D INSULIN SYRINGE 1CC/27G) 27G X 1/2" 1 ML MISC Use for injecting B-12 1 time a month.  . levothyroxine (SYNTHROID, LEVOTHROID) 100 MCG tablet TAKE 1/2 TABLET (50 MCG TOTAL) BY MOUTH EVERY DAY ON EMPTY STOMACH WITH A FULL GLASS OF WATER  . metoprolol (LOPRESSOR) 50 MG tablet Take 1 tablet (50 mg total) by mouth 2 (two) times daily.  . nitroGLYCERIN (NITROSTAT)  0.4 MG SL tablet Place 1 tablet (0.4 mg total) under the tongue every 5 (five) minutes as needed for chest pain (MAX 3 TABLETS).  . potassium chloride (KLOR-CON 10) 10 MEQ tablet Take 1 tablet (10 mEq total) by mouth 2 (two) times daily.  . ranitidine (ZANTAC) 150 MG tablet Take 150 mg by mouth 2 (two) times daily.  . simethicone (MYLICON) 80 MG chewable tablet Chew 80 mg by mouth every 6 (six) hours as needed for flatulence.  . sucralfate (CARAFATE) 1 GM/10ML suspension Take 1 g by mouth 3 (three) times daily.   [name] warfarin (COUMADIN) 2 MG tablet TAKE 1 TABLET DAILY OR AS DIRECTED   No current facility-administered  medications on file prior to visit.    Allergies  Allergen Reactions  . Adhesive [Tape] Other (See Comments)    Tears skin, Please use "paper" tape  . Amiodarone Other (See Comments)     Thyroid and liver and kidney problems  . Codeine Nausea And Vomiting  . Hydrocodone Other (See Comments)    hallucination  . Morphine And Related Nausea And Vomiting  . Requip [Ropinirole Hcl] Other (See Comments)    Headache   . Zinc Nausea Only    nausea  . Reglan [Metoclopramide] Hives, Itching and Rash    Made face red, also   Past Medical History:  Diagnosis Date  . Adenocarcinoma of stomach ([place]) 11/11/2015   gastric mass on egd  . Anal fissure   . Anemia    hx 3/17 iron infusion, on aranesp and injected B12 since 11/2015.   [name] Anxiety   . Aortic root dilatation ([place])   . Arthritis   . 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 CAD (coronary artery disease)   . Chronic kidney disease    only has one kidney rt lft rem 03 ca     dr. Risa Grill  . Elevated LFTs    hepatic steatosis  . Gastroparesis   . GERD (gastroesophageal reflux disease)   . History of uterine cancer 1980s   treated with hysterectomy, external radiation and radiation seed implants.   [name] HTN (hypertension)   . Hyperlipemia   . Hypothyroidism   . Moderate aortic stenosis   . Neuropathy ([place])    feet/legs  . No blood products 02/27/2016  . Osteomyelitis ([place]) as child  . PAF (paroxysmal atrial fibrillation) ([place])   . Radiation proctitis   . Restless leg syndrome   . Stroke [facility]) 17   partial stroke left legally blind  . Tachycardia-bradycardia syndrome [facility])    s/p PPM by Dr Doreatha Lew (MDT) 07/03/10   Health Maintenance  Topic Date Due  . DEXA SCAN  02/11/2001  . COLONOSCOPY  11/10/2020  . TETANUS/TDAP  08/11/2026  . INFLUENZA VACCINE  Completed  . PNA vac Low Risk Adult  Completed   Immunization History  Administered Date(s) Administered  . Influenza, High Dose Seasonal PF 07/18/2015  . Influenza,inj,Quad PF,36+ Mos  05/31/2014, 06/09/2016  . Influenza-Unspecified 09/16/2009, 07/03/2013  . Pneumococcal Conjugate-13 09/06/2002  . Pneumococcal Polysaccharide-23 07/18/2015  . Td 09/06/2002  . Zoster 09/06/2008   Past Surgical History:  Procedure Laterality Date  . CARDIAC CATHETERIZATION  2008  . CHOLECYSTECTOMY  1970s  . COLONOSCOPY N/A 11/11/2015   Procedure: COLONOSCOPY;  Surgeon: [name], MD;  Location: [facility];  Service: Endoscopy;  Laterality: N/A;  . ESOPHAGOGASTRODUODENOSCOPY N/A 11/11/2015   Procedure: ESOPHAGOGASTRODUODENOSCOPY (EGD);  Surgeon: [name], MD;  Location: [facility];  Service: Endoscopy;  Laterality: N/A;  . ESOPHAGOGASTRODUODENOSCOPY N/A 02/05/2016   Procedure: ESOPHAGOGASTRODUODENOSCOPY (EGD);  Surgeon: Ladene Artist, MD;  Location: [facility]COPY;  Service: Endoscopy;  Laterality: N/A;  . ESOPHAGOGASTRODUODENOSCOPY N/A 04/02/2016   Procedure: ESOPHAGOGASTRODUODENOSCOPY (EGD);  Surgeon: [name], MD;  Location: [facility];  Service: Endoscopy;  Laterality: N/A;  . GASTRECTOMY N/A 01/15/2016   Procedure: OPEN PARTIAL GASTRECTOMY;  Surgeon: Stark Klein, MD;  Location: [place];  Service: General;  Laterality: N/A;  . GASTRECTOMY Left 02/24/2016   Procedure: OPEN J TUBE;  Surgeon: Stark Klein, MD;  Location: [place];  Service: General;  Laterality: Left;  [name] GASTROJEJUNOSTOMY N/A 05/18/2016   Procedure: ROUX EN Y GASTROJEJUNOSTOMY;  Surgeon: Stark Klein, MD;  Location: [place];  Service: General;  Laterality: N/A;  . I&D KNEE WITH POLY EXCHANGE Right 12/17/2013   Procedure: IRRIGATION AND DEBRIDEMEN RIGHT TOTAL  KNEE WITH POLY EXCHANGE;  Surgeon: Mauri Pole, MD;  Location: [facility];  Service: Orthopedics;  Laterality: Right;  . IR GENERIC HISTORICAL  06/17/2016   IR RADIOLOGIST EVAL & MGMT 06/17/2016 Ascencion Dike, PA-C [facility] INTERV RAD  . LAPAROSCOPY N/A 01/15/2016   Procedure: LAPAROSCOPY DIAGNOSTIC;  Surgeon: Stark Klein, MD;  Location: [place];  Service: General;   Laterality: N/A;  . LYSIS OF ADHESION  05/18/2016   Procedure: LYSIS OF ADHESION;  Surgeon: Stark Klein, MD;  Location: [place];  Service: General;;  . NEPHRECTOMY Left    renal cell cancer  . PACEMAKER INSERTION  07/04/11   MDT PPM implanted by Dr Doreatha Lew for tachy/brady  . PATELLAR TENDON REPAIR Right 03/06/2014   Procedure: AVULSION WITH PATELLA TENDON REPAIR;  Surgeon: Mauri Pole, MD;  Location: [facility];  Service: Orthopedics;  Laterality: Right;  . PATENT DUCTUS ARTERIOUS REPAIR  ~ 1949  . TOTAL ABDOMINAL HYSTERECTOMY  85  . TOTAL HIP REVISION Right 2009   initial replacment surg  . TOTAL KNEE ARTHROPLASTY  02/07/2012   Procedure: TOTAL KNEE ARTHROPLASTY;  Surgeon: Mauri Pole, MD;  Location: [facility];  Service: Orthopedics;  Laterality: Right;  . TOTAL KNEE REVISION Right 07/29/2014   Procedure: RIGHT KNEE REVISION OF PREVIOUS REPAIR EXTENSOR MECHANISM;  Surgeon: Mauri Pole, MD;  Location: [facility];  Service: Orthopedics;  Laterality: Right;  . WHIPPLE PROCEDURE N/A 05/18/2016   Procedure: EXPLORATORY LAPAROTOMY;  Surgeon: Stark Klein, MD;  Location: [facility];  Service: General;  Laterality: N/A;   Family History  Problem Relation Age of Onset  . Colon cancer Mother 72  . Heart disease Mother   . Heart disease Father   . Heart attack Father   . Heart disease Maternal Grandmother    Social History  Substance Use Topics  . Smoking status: Never Smoker  . Smokeless tobacco: Never Used  . Alcohol use No    ROS Constitutional: Denies fever, chills, weight loss/gain, headaches, insomnia,  night sweats, and change in appetite. Does c/o fatigue. Eyes: Denies redness, blurred vision, diplopia, discharge, itchy, watery eyes.  ENT: Denies discharge, congestion, post nasal drip, epistaxis, sore throat, earache, hearing loss, dental pain, Tinnitus, Vertigo, Sinus pain, snoring.  Cardio: Denies chest pain, palpitations, irregular heartbeat, syncope, dyspnea, diaphoresis, orthopnea, PND,  claudication, edema Respiratory: denies cough, dyspnea, DOE, pleurisy, hoarseness, laryngitis, wheezing.  Gastrointestinal: Denies dysphagia, heartburn, reflux, water brash, pain, cramps, nausea, vomiting, bloating, diarrhea, constipation, hematemesis, melena, hematochezia, jaundice, hemorrhoids Genitourinary: Denies dysuria, frequency, urgency, nocturia, hesitancy, discharge, hematuria, flank pain Breast: Breast lumps, nipple discharge, bleeding.  Musculoskeletal:  Denies arthralgia, myalgia, stiffness, Jt. Swelling, pain, limp, and strain/sprain. Denies falls. Skin: Denies puritis, rash, hives, warts, acne, eczema, changing in skin lesion Neuro: No weakness, tremor, incoordination, spasms, paresthesia, pain Psychiatric: Denies confusion, memory loss, sensory loss. Denies Depression. Endocrine: Denies change in weight, skin, hair change, nocturia, and paresthesia, diabetic polys, visual blurring, hyper / hypo glycemic episodes.  Heme/Lymph: No excessive bleeding, bruising, enlarged lymph nodes.  Physical Exam  BP (!) 166/80   Pulse 72   Temp 97.5 F (36.4 C)   Resp 16   Ht 5\' 1"  (1.549 m)   Wt 120 lb (54.4 kg)   BMI 22.67 kg/m   General Appearance: Well nourished and in no apparent distress.  Eyes: PERRLA, EOMs, conjunctiva no swelling or erythema, normal fundi and vessels. Sinuses: No frontal/maxillary tenderness ENT/Mouth: EACs patent / TMs  nl. Nares clear without erythema, swelling, mucoid exudates. Oral hygiene is good. No erythema, swelling, or exudate. Tongue normal, non-obstructing. Tonsils not swollen or erythematous. Hearing normal.  Neck: Supple, thyroid normal. No bruits, nodes or JVD. (has transmitted sys murmur radiating to the carotids). Respiratory: Respiratory effort normal.  BS equal and clear bilateral without rales, rhonci, wheezing or stridor. Cardio: Heart sounds are soft with regular rate and rhythm and 3/4 coarse sys  Murmurs radiating widely and to both  carotids. Peripheral pulses are normal and equal bilaterally without edema. No aortic or femoral bruits. Chest: symmetric with normal excursions and percussion. Breasts: Symmetric, atrophic without lumps, nipple discharge, retractions, or fibrocystic changes.  Abdomen: Flat, soft with bowel sounds active. Nontender, no guarding, rebound, hernias, masses, or organomegaly.  Lymphatics: Non tender without lymphadenopathy.  Genitourinary:  Musculoskeletal: Full ROM all peripheral extremities, joint stability, 5/5 strength, and normal gait. Skin: Warm and dry without rashes, lesions, cyanosis, clubbing or  ecchymosis.  Neuro: Cranial nerves intact, reflexes equal bilaterally. Normal muscle tone, no cerebellar symptoms. Sensation intact.  Pysch: Alert and oriented X 3, normal affect, Insight and Judgment appropriate.   Assessment and Plan  1. Annual Preventative Screening Examination   2. Essential hypertension  - CBC with Differential/Platelet - BASIC METABOLIC PANEL WITH GFR - Magnesium - TSH - EKG 12-Lead - Urinalysis, Routine w reflex microscopic  3. Mixed hyperlipidemia  - Hepatic function panel - Lipid panel - TSH - EKG 12-Lead  4. Prediabetes  - Hemoglobin A1c - Insulin, random - EKG 12-Lead  5. Vitamin D deficiency  - VITAMIN D 25 Hydroxy   6. Paroxysmal atrial fibrillation (HCC)  - EKG 12-Lead  7. Cardiac pacemaker (06/2010)  - EKG 12-Lead  8. Gastroesophageal reflux disease    9. CKD (chronic kidney disease), stage III   10. Hypothyroidism  - TSH  11. B12 deficiency  - Vitamin B12  12. Iron deficiency anemia  - Iron and TIBC  13. Screening for colorectal cancer  - POC Hemoccult Bld/Stl   14. Screening for ischemic heart disease  - EKG 12-Lead  15. Medication management  - CBC with Differential/Platelet - BASIC METABOLIC PANEL WITH GFR - Hepatic function panel - Magnesium - Lipid panel - TSH - Hemoglobin A1c - Insulin, random -  VITAMIN D 25 Hydroxy  - Urinalysis, Routine w reflex microscopic       Continue prudent diet as discussed, weight control, BP monitoring, regular exercise, and medications. Discussed med's effects and SE's. Screening labs and tests as requested with regular follow-up as recommended. Over 40 minutes of exam, counseling, chart review and high complex critical decision making was performed.

## 2016-11-27 LAB — URINALYSIS, MICROSCOPIC ONLY
Bacteria, UA: NONE SEEN [HPF]
RBC / HPF: NONE SEEN RBC/HPF (ref <=2)
Yeast: NONE SEEN [HPF]

## 2016-11-27 LAB — URINALYSIS, ROUTINE W REFLEX MICROSCOPIC
BILIRUBIN URINE: NEGATIVE
GLUCOSE, UA: NEGATIVE
HGB URINE DIPSTICK: NEGATIVE
KETONES UR: NEGATIVE
Nitrite: NEGATIVE
PH: 5 (ref 5.0–8.0)
Protein, ur: NEGATIVE
Specific Gravity, Urine: 1.011 (ref 1.001–1.035)

## 2016-11-27 LAB — VITAMIN D 25 HYDROXY (VIT D DEFICIENCY, FRACTURES): Vit D, 25-Hydroxy: 59 ng/mL (ref 30–100)

## 2016-11-27 LAB — VITAMIN B12: Vitamin B-12: 418 pg/mL (ref 200–1100)

## 2016-11-27 LAB — HEMOGLOBIN A1C
HEMOGLOBIN A1C: 4.7 % (ref <5.7)
MEAN PLASMA GLUCOSE: 88 mg/dL

## 2016-11-27 LAB — MAGNESIUM: Magnesium: 1.6 mg/dL (ref 1.5–2.5)

## 2016-11-29 LAB — INSULIN, RANDOM: INSULIN: 3.8 u[IU]/mL (ref 2.0–19.6)

## 2016-12-16 ENCOUNTER — Ambulatory Visit (INDEPENDENT_AMBULATORY_CARE_PROVIDER_SITE_OTHER): Payer: Medicare Other | Admitting: *Deleted

## 2016-12-16 ENCOUNTER — Other Ambulatory Visit: Payer: Self-pay | Admitting: Internal Medicine

## 2016-12-16 DIAGNOSIS — Z7901 Long term (current) use of anticoagulants: Secondary | ICD-10-CM

## 2016-12-16 DIAGNOSIS — I4891 Unspecified atrial fibrillation: Secondary | ICD-10-CM | POA: Diagnosis not present

## 2016-12-16 LAB — POCT INR: INR: 2.2

## 2016-12-22 ENCOUNTER — Other Ambulatory Visit: Payer: Self-pay | Admitting: Internal Medicine

## 2016-12-26 ENCOUNTER — Other Ambulatory Visit: Payer: Self-pay | Admitting: Internal Medicine

## 2016-12-30 ENCOUNTER — Encounter: Payer: Self-pay | Admitting: Internal Medicine

## 2017-01-04 ENCOUNTER — Other Ambulatory Visit: Payer: Self-pay | Admitting: Internal Medicine

## 2017-01-04 ENCOUNTER — Telehealth: Payer: Self-pay | Admitting: *Deleted

## 2017-01-04 NOTE — Telephone Encounter (Signed)
Patient's daughter called and reported the patient is having hallucinations at night and asked if increase to 3 Gabapentin daily could cause this.  Per Dr Melford Aase, reduce the dose to 2 tablets daily, leaving off the bedtime dose, and see if the hallucinations stop.  Diana,daughter, aware of advise.

## 2017-01-07 ENCOUNTER — Ambulatory Visit (INDEPENDENT_AMBULATORY_CARE_PROVIDER_SITE_OTHER): Payer: Medicare Other | Admitting: *Deleted

## 2017-01-07 DIAGNOSIS — Z7901 Long term (current) use of anticoagulants: Secondary | ICD-10-CM | POA: Diagnosis not present

## 2017-01-07 DIAGNOSIS — I4891 Unspecified atrial fibrillation: Secondary | ICD-10-CM | POA: Diagnosis not present

## 2017-01-07 LAB — POCT INR: INR: 2

## 2017-01-15 NOTE — Progress Notes (Signed)
Cardiology Office Note   Date:  01/18/2017   ID:  [name], DOB 02/18/1934, MRN [id number]  PCP:  [name], MD  Cardiologist: [name] MD  Chief Complaint  Patient presents with  . Atrial Fibrillation      History of Present Illness: [name] is a 83 y.o. female who presents for follow-up Afib  She has a past history of tachybradycardia syndrome . She has a permanent pacemaker implanted 07/01/10. She has a remote history of repair of a patent ductus arteriosus with repair. She has a history of moderate valvular aortic stenosis. She has had a history of congestive heart failure. She was admitted 08/25/12-08/29/12 for exacerbation of CHF. She was hospitalized again in November 2015 for congestive heart failure and pneumonia. Her echocardiogram 08/26/12 showed ejection fraction of 60-65% with grade 2 diastolic dysfunction and a peak aortic valve gradient of 29 with a mean aortic valve gradient of 14. Her more recent echocardiogram in March 2017 showed ejection fraction of 60-65%. The aortic valve showed mild- moderate stenosis and moderate regurgitation.  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 She had carotid Dopplers which did not show any significant obstruction.  In the spring of 2017 she developed symptoms of dyspnea and fatigue. She was found to be anemic. Anemia improved with iron and Epogen. 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 GI evaluation revealed a gastric mass with bx c/w adenoCA. She is s/p surgical resection.  Post gastric resection she did lose a significant amount of weight. She had to have reconstruction due to inability to eat in September.   She was last admitted in February with Afib and RVR and weakness. Her daughter had noted she was hypotensive at home and metoprolol and cardizem were held. She was hydrated and Cardizem was resumed at 180  mg daily. Metoprolol resumed at a lower dose of 50 mg bid. She was seen in the Pacemaker clinic on 11/25/16. She was noted to have afib burden of 23%. AAD therapy was discussed with Flecainide but patient deferred for now.   Echo 11/14/15: EF 60-65%, mild concentric hypertrophy, No regional WMA, mild-mod AS, mod AR, mod MS, mild MR, bilateral atria mildly dilated  Nuclear stress test 11/21/2015: Normal stress nuclear study with no ischemia or infarction; EF 62% with normal wall motion; note patient in atrial flutter at time of study.  On follow up today she states she is doing very well. Denies any chest pain, dyspnea, palpitations. Mild lightheadedness over the last few days but BP has been stable. No edema.   Past Medical History:  Diagnosis Date  . Adenocarcinoma of stomach ([place]) 11/11/2015   gastric mass on egd  . Anal fissure   . Anemia    hx 3/17 iron infusion, on aranesp and injected B12 since 11/2015.   [name] Anxiety   . Aortic root dilatation ([place])   . Arthritis   . 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 CAD (coronary artery disease)   . Chronic kidney disease    only has one kidney rt lft rem 03 ca     dr. Risa Grill  . Elevated LFTs    hepatic steatosis  . Gastroparesis   . GERD (gastroesophageal reflux disease)   . History of uterine cancer 1980s   treated with hysterectomy, external radiation and radiation seed implants.   [name] HTN (hypertension)   . Hyperlipemia   .  Hypothyroidism   . Moderate aortic stenosis   . Neuropathy ([place])    feet/legs  . No blood products 02/27/2016  . Osteomyelitis ([place]) as child  . PAF (paroxysmal atrial fibrillation) ([place])   . Radiation proctitis   . Restless leg syndrome   . Stroke [facility]) 15   partial stroke left legally blind  . Tachycardia-bradycardia syndrome ([place])    s/p PPM by Dr Doreatha Lew (MDT) 07/03/10    Past Surgical History:  Procedure Laterality Date  . CARDIAC CATHETERIZATION  2008  . CHOLECYSTECTOMY  1970s  . COLONOSCOPY N/A 11/11/2015    Procedure: COLONOSCOPY;  Surgeon: [name], MD;  [facility];  Service: Endoscopy;  Laterality: N/A;  . ESOPHAGOGASTRODUODENOSCOPY N/A 11/11/2015   Procedure: ESOPHAGOGASTRODUODENOSCOPY (EGD);  Surgeon: [name], MD;  Location[facility];  Service: Endoscopy;  Laterality: N/A;  . ESOPHAGOGASTRODUODENOSCOPY N/A 02/05/2016   Procedure: ESOPHAGOGASTRODUODENOSCOPY (EGD);  Surgeon: Ladene Artist, MD;  Location[facility] ENDOSCOPY;  Service: Endoscopy;  Laterality: N/A;  . ESOPHAGOGASTRODUODENOSCOPY N/A 04/02/2016   Procedure: ESOPHAGOGASTRODUODENOSCOPY (EGD);  Surgeon: [name], MD;  Location: [facility];  Service: Endoscopy;  Laterality: N/A;  . GASTRECTOMY N/A 01/15/2016   Procedure: OPEN PARTIAL GASTRECTOMY;  Surgeon: Stark Klein, MD;  Location: [place];  Service: General;  Laterality: N/A;  . GASTRECTOMY Left 02/24/2016   Procedure: OPEN J TUBE;  Surgeon: Stark Klein, MD;  Location: [place];  Service: General;  Laterality: Left;  [name] GASTROJEJUNOSTOMY N/A 05/18/2016   Procedure: ROUX EN Y GASTROJEJUNOSTOMY;  Surgeon: Stark Klein, MD;  Location: [place];  Service: General;  Laterality: N/A;  . I&D KNEE WITH POLY EXCHANGE Right 12/17/2013   Procedure: IRRIGATION AND DEBRIDEMEN RIGHT TOTAL  KNEE WITH POLY EXCHANGE;  Surgeon: Mauri Pole, MD;  [facility];  Service: Orthopedics;  Laterality: Right;  . IR GENERIC HISTORICAL  06/17/2016   IR RADIOLOGIST EVAL & MGMT 06/17/2016 Ascencion Dike, PA-C [facility] INTERV RAD  . LAPAROSCOPY N/A 01/15/2016   Procedure: LAPAROSCOPY DIAGNOSTIC;  Surgeon: Stark Klein, MD;  Location: [place];  Service: General;  Laterality: N/A;  . LYSIS OF ADHESION  05/18/2016   Procedure: LYSIS OF ADHESION;  Surgeon: Stark Klein, MD;  Location: [place];  Service: General;;  . NEPHRECTOMY Left    renal cell cancer  . PACEMAKER INSERTION  07/04/11   MDT PPM implanted by Dr Doreatha Lew for tachy/brady  . PATELLAR TENDON REPAIR Right 03/06/2014   Procedure: AVULSION  WITH PATELLA TENDON REPAIR;  Surgeon: Mauri Pole, MD;  [facility];  Service: Orthopedics;  Laterality: Right;  . PATENT DUCTUS ARTERIOUS REPAIR  ~ 1949  . TOTAL ABDOMINAL HYSTERECTOMY  85  . TOTAL HIP REVISION Right 2009   initial replacment surg  . TOTAL KNEE ARTHROPLASTY  02/07/2012   Procedure: TOTAL KNEE ARTHROPLASTY;  Surgeon: Mauri Pole, MD;  [facility];  Service: Orthopedics;  Laterality: Right;  . TOTAL KNEE REVISION Right 07/29/2014   Procedure: RIGHT KNEE REVISION OF PREVIOUS REPAIR EXTENSOR MECHANISM;  Surgeon: Mauri Pole, MD;  [facility];  Service: Orthopedics;  Laterality: Right;  . WHIPPLE PROCEDURE N/A 05/18/2016   Procedure: EXPLORATORY LAPAROTOMY;  Surgeon: Stark Klein, MD;  Location: [place];  Service: General;  Laterality: N/A;     Current Outpatient Prescriptions  Medication Sig Dispense Refill  . acetaminophen (TYLENOL) 500 MG tablet Take 500 mg by mouth every 6 (six) hours as needed for mild pain.    [name] ALPRAZolam (  XANAX) 0.5 MG tablet TAKE 1/2-1 TABLET BY MOUTH 3 TIMES A DAY AS NEEDED FOR ANXIETY 90 tablet 0  . Biotin 10000 MCG TABS Take 1 tablet by mouth daily.    . Cholecalciferol (VITAMIN D PO) Take 5,000 Units by mouth daily.    . cyanocobalamin (,VITAMIN B-12,) 1000 MCG/ML injection Inject 1/2 to 1 ml [place] into the skin 1 time a month. 10 mL 0  . diltiazem (CARDIZEM CD) 180 MG 24 hr capsule Take 1 capsule (180 mg total) by mouth daily. 90 capsule 1  . furosemide (LASIX) 40 MG tablet Take 0.5 tablets (20 mg total) by mouth daily. 90 tablet 0  . gabapentin (NEURONTIN) 300 MG capsule Take 600 mg by mouth 2 (two) times daily.    . Insulin Syringe-Needle U-100 (B-D INSULIN SYRINGE 1CC/27G) 27G X 1/2" 1 ML MISC Use for injecting B-12 1 time a month. 100 each 0  . levothyroxine (SYNTHROID, LEVOTHROID) 100 MCG tablet TAKE 1/2 TABLET (50 MCG TOTAL) BY MOUTH EVERY DAY ON EMPTY STOMACH WITH A FULL GLASS OF WATER 90 tablet 1  . metoprolol (LOPRESSOR)  50 MG tablet Take 1 tablet (50 mg total) by mouth 2 (two) times daily. 60 tablet 0  . nitroGLYCERIN (NITROSTAT) 0.4 MG SL tablet Place 1 tablet (0.4 mg total) under the tongue every 5 (five) minutes as needed for chest pain (MAX 3 TABLETS). 25 tablet prn  . potassium chloride (KLOR-CON 10) 10 MEQ tablet Take 1 tablet (10 mEq total) by mouth 2 (two) times daily. 180 tablet 1  . ranitidine (ZANTAC) 150 MG tablet Take 150 mg by mouth 2 (two) times daily.    . simethicone (MYLICON) 80 MG chewable tablet Chew 80 mg by mouth every 6 (six) hours as needed for flatulence.    . sucralfate (CARAFATE) 1 g tablet Take 1 g by mouth 3 (three) times daily.    [name] warfarin (COUMADIN) 2 MG tablet TAKE 1 TABLET DAILY OR AS DIRECTED 100 tablet 1   No current facility-administered medications for this visit.     Allergies:   Adhesive [tape]; Amiodarone; Codeine; Hydrocodone; Morphine and related; Requip [ropinirole hcl]; Zinc; and Reglan [metoclopramide]    Social History:  The patient  reports that she has never smoked. She has never used smokeless tobacco. She reports that she does not drink alcohol or use drugs.   Family History:  The patient's family history includes Colon cancer (age of onset: 86) in her mother; Heart attack in her father; Heart disease in her father, maternal grandmother, and mother.    ROS:  Please see the history of present illness.   Otherwise, review of systems are positive for none.   All other systems are reviewed and negative.    PHYSICAL EXAM: VS:  BP 122/84   Pulse 76   Ht 5\' 1"  (1.549 m)   Wt 113 lb 12.8 oz (51.6 kg)   BMI 21.50 kg/m  , BMI Body mass index is 21.5 kg/m. GEN: Well nourished, well developed, in no acute distress  HEENT: normal  Neck: no JVD, carotid bruits, or masses Cardiac: regular rhythm. There is a grade 2/6 systolic ejection murmur at the aortic area radiating to LSB. No diastolic murmur heard. There is no rubs, or gallops,no edema  Respiratory:  clear  to auscultation bilaterally, normal work of breathing GI: soft, nontender, nondistended, + BS MS: no deformity or atrophy  Skin: warm and dry, no rash Neuro:  Strength and sensation are intact Psych: euthymic  mood, full affect   EKG:  EKG is not ordered today. The ekg ordered today N/A  Recent Labs: 11/26/2016: ALT 8; BUN 15; Creat 1.02; Hemoglobin 13.6; Magnesium 1.6; Platelets 202; Potassium 3.9; Sodium 145; TSH 0.84    Lipid Panel    Component Value Date/Time   CHOL 146 11/26/2016 1144   TRIG 162 (H) 11/26/2016 1144   HDL 32 (L) 11/26/2016 1144   CHOLHDL 4.6 11/26/2016 1144   VLDL 32 (H) 11/26/2016 1144   LDLCALC 82 11/26/2016 1144      Wt Readings from Last 3 Encounters:  01/18/17 113 lb 12.8 oz (51.6 kg)  11/26/16 120 lb (54.4 kg)  11/25/16 119 lb (54 kg)     Echo 11/14/15:  Study Conclusions  - Left ventricle: The cavity size was normal. There was mild  concentric hypertrophy. Systolic function was normal. The  estimated ejection fraction was in the range of 60% to 65%. Wall  motion was normal; there were no regional wall motion  abnormalities. The study was not technically sufficient to allow  evaluation of LV diastolic dysfunction due to atrial  fibrillation. - Aortic valve: Valve mobility was restricted. There was mild to  moderate stenosis. There was moderate regurgitation. Peak  gradient (S): 38 mm Hg. Valve area (VTI): 0.55 cm^2. Valve area  (Vmax): 0.57 cm^2. Valve area (Vmean): 0.59 cm^2. - Aortic root: The aortic root was normal in size. - Ascending aorta: The ascending aorta was normal in size. - Mitral valve: Calcified annulus. Moderately thickened, severely  calcified leaflets . Mobility was restricted. The findings are  consistent with moderate stenosis. There was mild regurgitation.  Valve area by continuity equation (using LVOT flow): 0.82 cm^2. - Left atrium: The atrium was mildly dilated. - Right ventricle: The cavity size was  mildly dilated. Wall  thickness was normal. Systolic function was mildly reduced. - Right atrium: The atrium was mildly dilated. - Tricuspid valve: There was moderate regurgitation. - Pulmonary arteries: Systolic pressure was moderately to severely  increased. PA peak pressure: 55 mm Hg (S). - Inferior vena cava: The vessel was dilated. The respirophasic  diameter changes were blunted (< 50%), consistent with elevated  central venous pressure. - Pericardium, extracardiac: There was no pericardial effusion.  Study Highlights     The left ventricular ejection fraction is normal (55-65%).  Nuclear stress EF: 62%.  The study is normal.  Normal stress nuclear study with no ischemia or infarction; EF 62 with normal wall motion; note patient in atrial flutter at time of study.    ASSESSMENT AND PLAN:  1. Paroxysmal atrial fibrillation. Frequency of paroxysmal atrial fibrillation improved on last pacer check. Rate is controlled. She is tolerating lower dose of metoprolol better.  On coumadin for anticoagulation. Last INR 2.0.  2. Tachybradycardia syndrome with functioning dual-chamber pacemaker. 3. Essential hypertension without heart failure. BP is well controlled.  4. Hypercholesterolemia followed by PCP 5. Mild to moderate aortic stenosis/ moderate Mitral stenosis.  7. history of left nephrectomy secondary to kidney cancer- 15 years ago. 8. history of endometrial cancer 9. Gastric tumor c/w adenoCA. s/p surgical resection and subsequent reconstruction.  10. History of cholesterol embolus to her left eye with central retinal artery occlusion and loss of vision. On Coumadin.     Current medicines are reviewed at length with the patient today.  The patient does not have concerns regarding medicines.  The following changes have been made:  none  Labs/ tests ordered today include:   No orders  of the defined types were placed in this encounter.    Signed, [name] MD,  [facility]    01/18/2017 11:24 AM    [address], [place],   [address] Phone: [phone]; Fax: [phone]

## 2017-01-18 ENCOUNTER — Encounter: Payer: Self-pay | Admitting: *Deleted

## 2017-01-18 ENCOUNTER — Ambulatory Visit (INDEPENDENT_AMBULATORY_CARE_PROVIDER_SITE_OTHER): Payer: Medicare Other | Admitting: Cardiology

## 2017-01-18 VITALS — BP 122/84 | HR 76 | Ht 61.0 in | Wt 113.8 lb

## 2017-01-18 DIAGNOSIS — I350 Nonrheumatic aortic (valve) stenosis: Secondary | ICD-10-CM | POA: Diagnosis not present

## 2017-01-18 DIAGNOSIS — I480 Paroxysmal atrial fibrillation: Secondary | ICD-10-CM | POA: Diagnosis not present

## 2017-01-18 DIAGNOSIS — I495 Sick sinus syndrome: Secondary | ICD-10-CM

## 2017-01-18 DIAGNOSIS — I342 Nonrheumatic mitral (valve) stenosis: Secondary | ICD-10-CM | POA: Diagnosis not present

## 2017-01-18 NOTE — Patient Instructions (Signed)
Continue your current therapy  I will see you in 6  Months   

## 2017-01-19 ENCOUNTER — Other Ambulatory Visit: Payer: Self-pay | Admitting: Dermatology

## 2017-01-19 DIAGNOSIS — C44629 Squamous cell carcinoma of skin of left upper limb, including shoulder: Secondary | ICD-10-CM | POA: Diagnosis not present

## 2017-01-19 DIAGNOSIS — L821 Other seborrheic keratosis: Secondary | ICD-10-CM | POA: Diagnosis not present

## 2017-01-19 DIAGNOSIS — L299 Pruritus, unspecified: Secondary | ICD-10-CM | POA: Diagnosis not present

## 2017-01-19 DIAGNOSIS — D229 Melanocytic nevi, unspecified: Secondary | ICD-10-CM | POA: Diagnosis not present

## 2017-01-23 ENCOUNTER — Other Ambulatory Visit: Payer: Self-pay | Admitting: Internal Medicine

## 2017-01-27 ENCOUNTER — Encounter: Payer: Self-pay | Admitting: *Deleted

## 2017-02-01 ENCOUNTER — Encounter: Payer: Self-pay | Admitting: *Deleted

## 2017-02-04 ENCOUNTER — Ambulatory Visit (INDEPENDENT_AMBULATORY_CARE_PROVIDER_SITE_OTHER): Payer: Medicare Other | Admitting: Pharmacist

## 2017-02-04 DIAGNOSIS — Z7901 Long term (current) use of anticoagulants: Secondary | ICD-10-CM | POA: Diagnosis not present

## 2017-02-04 DIAGNOSIS — I4891 Unspecified atrial fibrillation: Secondary | ICD-10-CM

## 2017-02-04 LAB — POCT INR: INR: 1.9

## 2017-02-17 ENCOUNTER — Other Ambulatory Visit: Payer: Self-pay | Admitting: *Deleted

## 2017-02-17 MED ORDER — NITROGLYCERIN 0.4 MG SL SUBL: 0.4000 mg | SUBLINGUAL_TABLET | SUBLINGUAL | 99 refills | Status: DC | PRN

## 2017-02-19 ENCOUNTER — Other Ambulatory Visit: Payer: Self-pay | Admitting: Internal Medicine

## 2017-02-28 ENCOUNTER — Ambulatory Visit: Payer: Medicare Other | Admitting: Hematology

## 2017-02-28 ENCOUNTER — Ambulatory Visit (INDEPENDENT_AMBULATORY_CARE_PROVIDER_SITE_OTHER): Payer: Medicare Other | Admitting: *Deleted

## 2017-02-28 ENCOUNTER — Telehealth: Payer: Self-pay | Admitting: Cardiology

## 2017-02-28 DIAGNOSIS — I495 Sick sinus syndrome: Secondary | ICD-10-CM

## 2017-02-28 DIAGNOSIS — C169 Malignant neoplasm of stomach, unspecified: Secondary | ICD-10-CM | POA: Diagnosis not present

## 2017-02-28 DIAGNOSIS — Z903 Acquired absence of stomach [part of]: Secondary | ICD-10-CM | POA: Diagnosis not present

## 2017-02-28 NOTE — Telephone Encounter (Signed)
Spoke with pt and reminded pt of remote transmission that is due today. Pt verbalized understanding.   

## 2017-02-28 NOTE — Progress Notes (Signed)
Remote pacemaker transmission.   

## 2017-03-01 ENCOUNTER — Ambulatory Visit: Payer: Self-pay | Admitting: Internal Medicine

## 2017-03-01 ENCOUNTER — Other Ambulatory Visit: Payer: Self-pay | Admitting: Internal Medicine

## 2017-03-02 ENCOUNTER — Encounter: Payer: Self-pay | Admitting: Cardiology

## 2017-03-02 LAB — CUP PACEART REMOTE DEVICE CHECK
Brady Statistic AP VP Percent: 0 %
Brady Statistic AP VS Percent: 70 %
Brady Statistic AS VP Percent: 0 %
Brady Statistic AS VS Percent: 30 %
Implantable Lead Implant Date: 20111027
Implantable Lead Location: 753859
Implantable Lead Model: 4470
Implantable Lead Serial Number: 686026
Lead Channel Impedance Value: 398 Ohm
Lead Channel Impedance Value: 493 Ohm
Lead Channel Pacing Threshold Amplitude: 0.375 V
Lead Channel Pacing Threshold Amplitude: 1.375 V
Lead Channel Pacing Threshold Pulse Width: 0.4 ms
Lead Channel Setting Pacing Amplitude: 2 V
MDC IDC LEAD IMPLANT DT: 20111027
MDC IDC LEAD LOCATION: 753860
MDC IDC LEAD SERIAL: 548229
MDC IDC MSMT BATTERY IMPEDANCE: 822 Ohm
MDC IDC MSMT BATTERY REMAINING LONGEVITY: 72 mo
MDC IDC MSMT BATTERY VOLTAGE: 2.8 V
MDC IDC MSMT LEADCHNL RV PACING THRESHOLD PULSEWIDTH: 0.4 ms
MDC IDC PG IMPLANT DT: 20111027
MDC IDC SESS DTM: 20180625165517
MDC IDC SET LEADCHNL RV PACING AMPLITUDE: 2.75 V
MDC IDC SET LEADCHNL RV PACING PULSEWIDTH: 0.4 ms
MDC IDC SET LEADCHNL RV SENSING SENSITIVITY: 2 mV

## 2017-03-03 DIAGNOSIS — C44629 Squamous cell carcinoma of skin of left upper limb, including shoulder: Secondary | ICD-10-CM | POA: Diagnosis not present

## 2017-03-04 ENCOUNTER — Ambulatory Visit (INDEPENDENT_AMBULATORY_CARE_PROVIDER_SITE_OTHER): Payer: Medicare Other

## 2017-03-04 DIAGNOSIS — Z7901 Long term (current) use of anticoagulants: Secondary | ICD-10-CM

## 2017-03-04 DIAGNOSIS — I4891 Unspecified atrial fibrillation: Secondary | ICD-10-CM | POA: Diagnosis not present

## 2017-03-04 LAB — POCT INR: INR: 1.9

## 2017-03-16 ENCOUNTER — Encounter: Payer: Self-pay | Admitting: Cardiology

## 2017-03-18 ENCOUNTER — Telehealth: Payer: Self-pay

## 2017-03-18 ENCOUNTER — Encounter: Payer: Self-pay | Admitting: Internal Medicine

## 2017-03-18 ENCOUNTER — Ambulatory Visit (INDEPENDENT_AMBULATORY_CARE_PROVIDER_SITE_OTHER): Payer: Medicare Other | Admitting: Internal Medicine

## 2017-03-18 VITALS — BP 130/70 | HR 78 | Ht 61.0 in | Wt 102.4 lb

## 2017-03-18 DIAGNOSIS — R634 Abnormal weight loss: Secondary | ICD-10-CM | POA: Diagnosis not present

## 2017-03-18 DIAGNOSIS — Z85028 Personal history of other malignant neoplasm of stomach: Secondary | ICD-10-CM

## 2017-03-18 DIAGNOSIS — Z9889 Other specified postprocedural states: Secondary | ICD-10-CM | POA: Diagnosis not present

## 2017-03-18 DIAGNOSIS — R112 Nausea with vomiting, unspecified: Secondary | ICD-10-CM | POA: Diagnosis not present

## 2017-03-18 DIAGNOSIS — Z903 Acquired absence of stomach [part of]: Secondary | ICD-10-CM

## 2017-03-18 NOTE — Telephone Encounter (Signed)
She may hold Coumadin for 5 days for planned endoscopy procedure  [name] MD, [facility]

## 2017-03-18 NOTE — Progress Notes (Signed)
[name] 83 y.o. [birth year] [id number]  Assessment & Plan:   Encounter Diagnoses  Name Primary?  . Post-operative nausea and vomiting Yes  . S/P partial gastrectomy   . Loss of weight   . History of gastric cancer      Schedule for upper endoscopy possible dilation of any strictures or stenoses that may be present postoperative period we'll hold warfarin 3 days before. He had this planned for August 1. Pending that I would consider repeating a CT scan in this patient prior to any other surgery.  I did explain risks benefits and indications of the procedures including the real but very rare risk of having embolic event and stroke or blood clot off of anticoagulation.  I appreciate the opportunity to care for this patient. CC: [name], MD Dr. Stark Klein  Subjective:   Chief Complaint:Vomiting  HPI The patient is here to request of 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 She was converted to a Roux-en-Y with disconnection of the stomach and duodenum, and has done better but is not holding her weight and is having some intermittent vomiting still. She's frequently nauseous and is unable to eat frequently. She has a very small gastric pouch left. Otherwise she is in good spirits and seems to be doing fairly well. She does take warfarin for stroke prophylaxis and atrial fibrillation. She is a Restaurant manager, fast food. Her daughter is here and participates in the history. Allergies  Allergen Reactions  . Adhesive [Tape] Other (See Comments)    Tears skin, Please use "paper" tape  . Amiodarone Other (See Comments)     Thyroid and liver and kidney problems  . Codeine Nausea And Vomiting  . Hydrocodone Other (See Comments)    hallucination  . Morphine And Related Nausea And Vomiting  . Requip [Ropinirole Hcl] Other (See Comments)    Headache   . Zinc Nausea Only    nausea  . Reglan  [Metoclopramide] Hives, Itching and Rash    Made face red, also   Current Meds  Medication Sig  . acetaminophen (TYLENOL) 500 MG tablet Take 500 mg by mouth every 6 (six) hours as needed for mild pain.  [name] ALPRAZolam (XANAX) 0.5 MG tablet TAKE 1/2-1 TABLET BY MOUTH 3 TIMES A DAY AS NEEDED FOR ANXIETY  . Biotin 10000 MCG TABS Take 1 tablet by mouth daily.  . Cholecalciferol (VITAMIN D PO) Take 5,000 Units by mouth daily.  . cyanocobalamin (,VITAMIN B-12,) 1000 MCG/ML injection INJECT 1/2 TO 1 ML INTO THE SKIN 1 TIME A MONTH.  [name] diltiazem (CARDIZEM CD) 180 MG 24 hr capsule Take 1 capsule (180 mg total) by mouth daily.  . furosemide (LASIX) 40 MG tablet Take 0.5 tablets (20 mg total) by mouth daily.  [name] gabapentin (NEURONTIN) 300 MG capsule Take 600 mg by mouth 2 (two) times daily.  . Insulin Syringe-Needle U-100 (B-D INSULIN SYRINGE 1CC/27G) 27G X 1/2" 1 ML MISC Use for injecting B-12 1 time a month.  . levothyroxine (SYNTHROID, LEVOTHROID) 100 MCG tablet TAKE 1 TABLET BY MOUTH EVERY DAY ON EMPTY STOMACH WITH A FULL GLASS OF WATER  . metoprolol (LOPRESSOR) 50 MG tablet Take 1 tablet (50 mg total) by mouth 2 (two) times daily.  . nitroGLYCERIN (NITROSTAT) 0.4 MG SL tablet Place 1 tablet (0.4 mg total) under the tongue every 5 (five) minutes as needed for chest pain (MAX 3 TABLETS).  . ondansetron (ZOFRAN) 4  MG tablet Take 4 mg by mouth every 8 (eight) hours as needed for nausea or vomiting.  . potassium chloride (KLOR-CON 10) 10 MEQ tablet Take 1 tablet (10 mEq total) by mouth 2 (two) times daily.  . ranitidine (ZANTAC) 150 MG tablet Take 150 mg by mouth 2 (two) times daily.  . simethicone (MYLICON) 80 MG chewable tablet Chew 80 mg by mouth every 6 (six) hours as needed for flatulence.  . sucralfate (CARAFATE) 1 g tablet TAKE 1 TABLET (1 G TOTAL) BY MOUTH 4 (FOUR) TIMES DAILY - WITH MEALS AND AT BEDTIME.  [name] warfarin (COUMADIN) 2 MG tablet TAKE 1 TABLET DAILY OR AS DIRECTED   Past Medical History:    Diagnosis Date  . Adenocarcinoma of stomach ([place]) 11/11/2015   gastric mass on egd  . Anal fissure   . Anemia    hx 3/17 iron infusion, on aranesp and injected B12 since 11/2015.   [name] Anxiety   . Aortic root dilatation ([place])   . Arthritis   . 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 CAD (coronary artery disease)   . Chronic kidney disease    only has one kidney rt lft rem 03 ca     dr. Risa Grill  . Elevated LFTs    hepatic steatosis  . Gastroparesis   . GERD (gastroesophageal reflux disease)   . History of uterine cancer 1980s   treated with hysterectomy, external radiation and radiation seed implants.   [name] HTN (hypertension)   . Hyperlipemia   . Hypothyroidism   . Moderate aortic stenosis   . Neuropathy    feet/legs  . No blood products 02/27/2016  . Osteomyelitis ([place]) as child  . PAF (paroxysmal atrial fibrillation) ([place])   . Radiation proctitis   . Restless leg syndrome   . Stroke [facility]) 15   partial stroke left legally blind  . Tachycardia-bradycardia syndrome ([place])    s/p PPM by Dr Doreatha Lew (MDT) 07/03/10   Past Surgical History:  Procedure Laterality Date  . CARDIAC CATHETERIZATION  2008  . CHOLECYSTECTOMY  1970s  . COLONOSCOPY N/A 11/11/2015   Procedure: COLONOSCOPY;  Surgeon: [name], MD;  [facility];  Service: Endoscopy;  Laterality: N/A;  . ESOPHAGOGASTRODUODENOSCOPY N/A 11/11/2015   Procedure: ESOPHAGOGASTRODUODENOSCOPY (EGD);  Surgeon: [name], MD;  Location[facility];  Service: Endoscopy;  Laterality: N/A;  . ESOPHAGOGASTRODUODENOSCOPY N/A 02/05/2016   Procedure: ESOPHAGOGASTRODUODENOSCOPY (EGD);  Surgeon: Ladene Artist, MD;  Location: [facility] ENDOSCOPY;  Service: Endoscopy;  Laterality: N/A;  . ESOPHAGOGASTRODUODENOSCOPY N/A 04/02/2016   Procedure: ESOPHAGOGASTRODUODENOSCOPY (EGD);  Surgeon: [name], MD;  Location[facility];  Service: Endoscopy;  Laterality: N/A;  . GASTRECTOMY N/A 01/15/2016   Procedure: OPEN PARTIAL GASTRECTOMY;  Surgeon: Stark Klein, MD;  Location: [place];  Service: General;  Laterality: N/A;  . GASTRECTOMY Left 02/24/2016   Procedure: OPEN J TUBE;  Surgeon: Stark Klein, MD;  Location: [place];  Service: General;  Laterality: Left;  [name] GASTROJEJUNOSTOMY N/A 05/18/2016   Procedure: ROUX EN Y GASTROJEJUNOSTOMY;  Surgeon: Stark Klein, MD;  Location: [place];  Service: General;  Laterality: N/A;  . I&D KNEE WITH POLY EXCHANGE Right 12/17/2013   Procedure: IRRIGATION AND DEBRIDEMEN RIGHT TOTAL  KNEE WITH POLY EXCHANGE;  Surgeon: Mauri Pole, MD;  [facility];  Service: Orthopedics;  Laterality: Right;  . IR GENERIC HISTORICAL  06/17/2016   IR RADIOLOGIST EVAL & MGMT 06/17/2016 Ascencion Dike, PA-C [facility] INTERV RAD  . LAPAROSCOPY N/A  01/15/2016   Procedure: LAPAROSCOPY DIAGNOSTIC;  Surgeon: Stark Klein, MD;  Location: [place];  Service: General;  Laterality: N/A;  . LYSIS OF ADHESION  05/18/2016   Procedure: LYSIS OF ADHESION;  Surgeon: Stark Klein, MD;  Location: [place];  Service: General;;  . NEPHRECTOMY Left    renal cell cancer  . PACEMAKER INSERTION  07/04/11   MDT PPM implanted by Dr Doreatha Lew for tachy/brady  . PATELLAR TENDON REPAIR Right 03/06/2014   Procedure: AVULSION WITH PATELLA TENDON REPAIR;  Surgeon: Mauri Pole, MD;  [facility];  Service: Orthopedics;  Laterality: Right;  . PATENT DUCTUS ARTERIOUS REPAIR  ~ 1949  . TOTAL ABDOMINAL HYSTERECTOMY  85  . TOTAL HIP REVISION Right 2009   initial replacment surg  . TOTAL KNEE ARTHROPLASTY  02/07/2012   Procedure: TOTAL KNEE ARTHROPLASTY;  Surgeon: Mauri Pole, MD;  [facility];  Service: Orthopedics;  Laterality: Right;  . TOTAL KNEE REVISION Right 07/29/2014   Procedure: RIGHT KNEE REVISION OF PREVIOUS REPAIR EXTENSOR MECHANISM;  Surgeon: Mauri Pole, MD;  [facility];  Service: Orthopedics;  Laterality: Right;  . WHIPPLE PROCEDURE N/A 05/18/2016   Procedure: EXPLORATORY LAPAROTOMY;  Surgeon: Stark Klein, MD;  Location: [place];  Service:  General;  Laterality: N/A;   Social History   Social History  . Marital status: Widowed   Social History Main Topics  . Smoking status: Never Smoker  . Smokeless tobacco: Never Used  . Alcohol use No  . Drug use: No  . Sexual activity: No    Social History Narrative   Lives in [place].  Continues to work.   family history includes Colon cancer (age of onset: 71) in her mother; Heart attack in her father; Heart disease in her father, maternal grandmother, and mother.   Review of Systems As per history of present illness she walks with a cane.  Objective:   Physical Exam BP 130/70 (BP Location: Left Arm, Patient Position: Sitting, Cuff Size: Normal)   Pulse 78   Ht 5\' 1"  (1.549 m)   Wt 102 lb 6.4 oz (46.4 kg)   SpO2 94%   BMI 19.35 kg/m  Spry elderly white woman in no acute distress Eyes are anicteric The lungs are clear Heart S1-S2 no rubs murmurs or gallops Abdomen soft Appropriate mood and affect an alert and oriented 3

## 2017-03-18 NOTE — Patient Instructions (Signed)
You have been scheduled for an endoscopy. Please follow written instructions given to you at your visit today. If you use inhalers (even only as needed), please bring them with you on the day of your procedure.  You will be contaced by our office prior to your procedure for directions on holding your Coumadin/Warfarin.  If you do not hear from our office 1 week prior to your scheduled procedure, please call [phone] to discuss.  I appreciate the opportunity to care for you. Carl Gessner, MD, FACG 

## 2017-03-18 NOTE — Telephone Encounter (Signed)
[facility] [address]  [place] [address]  03/18/2017   RE: [name] DOB: Aug 09, 1934 MRN: [id number]   Dear [name] MD,    We have scheduled the above patient for an endoscopic procedure. Our records show that she is on anticoagulation therapy.   Please advise as to how long the patient may come off her therapy of warfarin prior to the EGD procedure, which is scheduled for 04/06/17.  Dr Carlean Purl would like her to hold it 3 days if okay Sir.  Please fax back/ or route the completed form to [employer] [name], [place] at [phone].   Sincerely,    Silvano Rusk, MD, [facility]

## 2017-03-22 NOTE — Telephone Encounter (Signed)
Routed to Patti [name], [place].

## 2017-03-22 NOTE — Progress Notes (Signed)
3 month follow up  Assessment and Plan:    Essential hypertension - continue medications, DASH diet, exercise and monitor at home. Call if greater than 130/80.  -     CBC with Differential/Platelet -     BASIC METABOLIC PANEL WITH GFR -     Hepatic function panel -     TSH  Persistent atrial fibrillation (HCC) Continue cardio follow up  Adenocarcinoma of stomach (HCC) Continue follow up Dr. Laurene Footman Having weight loss, getting EGD  Other specified hypothyroidism Will check level, ? Too much medication with recent weight loss Hypothyroidism-check TSH level, continue medications the same, reminded to take on an empty stomach 30-44mins before food.  -     TSH  Peripheral nerve disease ([place]) monitor  Malignant neoplasm of kidney excluding renal pelvis, unspecified laterality (HCC) Check BMP, continue to monitor  CKD (chronic kidney disease), stage III Increase fluids, avoid NSAIDS, monitor sugars, will monitor -     BASIC METABOLIC PANEL WITH GFR  Hyperlipidemia, unspecified hyperlipidemia type -continue medications, check lipids, decrease fatty foods, increase activity.  -     Lipid panel  Medication management -     Magnesium  Protein-calorie malnutrition, moderate (HCC) Discussed diet/drinking ensure, continue to monitor weight daily -     Hepatic function panel  Right lower quadrant abdominal pain, with weight loss, possible mass versus stool burden - discussed with patient and daughter, will get CT AB/pelvic with contrast - if re occurence of cancer does not want treatment at this time - any worsening pain, unable to void, etc go to ER -     CT Abdomen Pelvis W Contrast; Future  Abnormal urinary stream -     CT Abdomen Pelvis W Contrast; Future -     Urinalysis, Routine w reflex microscopic -     Urine Culture   Over 30 minutes of exam, counseling, chart review and critical decision making was performed Future Appointments Date Time Provider  [place]  04/01/2017 11:15 AM [facility]  05/30/2017 11:20 AM [facility] DEVICE REMOTES [facility]  06/23/2017 2:30 PM [name], MD [facility] None  12/23/2017 10:00 AM [name], MD [facility] None     Subjective:  [name] is a 83 y.o. female who presents for 3 month follow up for HTN, chol, Afib, gastric adenocarcinoma.   She had partial gastrectomy in May 2017 for gastric adenocarcinoma with complication of abdominal abscess, following with [name] and Dr. Carlean Purl, she had complication with an abdominal abscess. She has a history of renal and uterine cancer. She has EGD scheduled on 04/06/2017, has been having weight loss and occ nausea.  BMI is Body mass index is 20.29 kg/m., she is working on weight gain.  Wt Readings from Last 3 Encounters:  03/23/17 107 lb 6.4 oz (48.7 kg)  03/18/17 102 lb 6.4 oz (46.4 kg)  01/18/17 113 lb 12.8 oz (51.6 kg)    Her blood pressure has been controlled at home, today their BP is BP: (!) 148/78  She does not workout. She denies chest pain, shortness of breath, dizziness. She has Afib/PPM and AS s/p pacemaker, follows with Dr. Angelena Form, on coumadin, taking 2 mg daily.  Lab Results  Component Value Date   INR 1.9 03/04/2017   INR 1.9 02/04/2017   INR 2.0 01/07/2017    She is not on cholesterol medication and denies myalgias. Her cholesterol is not at goal. The cholesterol last visit was:   Lab Results  Component Value Date   CHOL 146 11/26/2016   HDL 32 (L) 11/26/2016   LDLCALC 82 11/26/2016   TRIG 162 (H) 11/26/2016   CHOLHDL 4.6 11/26/2016   Last A1C in the office was:  Lab Results  Component Value Date   HGBA1C 4.7 11/26/2016   Lab Results  Component Value Date   GFRNONAA 52 (L) 11/26/2016   Patient is on Vitamin D supplement.   Lab Results  Component Value Date   VD25OH 37 11/26/2016     She is on thyroid medication. Her medication was not  changed last visit.   Lab Results  Component Value Date   TSH 0.84 11/26/2016  .  Medication Review: Current Outpatient Prescriptions on File Prior to Visit  Medication Sig  . acetaminophen (TYLENOL) 500 MG tablet Take 500 mg by mouth every 6 (six) hours as needed for mild pain.  [name] ALPRAZolam (XANAX) 0.5 MG tablet TAKE 1/2-1 TABLET BY MOUTH 3 TIMES A DAY AS NEEDED FOR ANXIETY  . Biotin 10000 MCG TABS Take 1 tablet by mouth daily.  . Cholecalciferol (VITAMIN D PO) Take 5,000 Units by mouth daily.  . cyanocobalamin (,VITAMIN B-12,) 1000 MCG/ML injection INJECT 1/2 TO 1 ML INTO THE SKIN 1 TIME A MONTH.  [name] diltiazem (CARDIZEM CD) 180 MG 24 hr capsule Take 1 capsule (180 mg total) by mouth daily.  . furosemide (LASIX) 40 MG tablet Take 0.5 tablets (20 mg total) by mouth daily.  [name] gabapentin (NEURONTIN) 300 MG capsule Take 600 mg by mouth 2 (two) times daily.  . Insulin Syringe-Needle U-100 (B-D INSULIN SYRINGE 1CC/27G) 27G X 1/2" 1 ML MISC Use for injecting B-12 1 time a month.  . levothyroxine (SYNTHROID, LEVOTHROID) 100 MCG tablet TAKE 1 TABLET BY MOUTH EVERY DAY ON EMPTY STOMACH WITH A FULL GLASS OF WATER  . metoprolol (LOPRESSOR) 50 MG tablet Take 1 tablet (50 mg total) by mouth 2 (two) times daily.  . nitroGLYCERIN (NITROSTAT) 0.4 MG SL tablet Place 1 tablet (0.4 mg total) under the tongue every 5 (five) minutes as needed for chest pain (MAX 3 TABLETS).  . ondansetron (ZOFRAN) 4 MG tablet Take 4 mg by mouth every 8 (eight) hours as needed for nausea or vomiting.  . potassium chloride (KLOR-CON 10) 10 MEQ tablet Take 1 tablet (10 mEq total) by mouth 2 (two) times daily.  . ranitidine (ZANTAC) 150 MG tablet Take 150 mg by mouth 2 (two) times daily.  . simethicone (MYLICON) 80 MG chewable tablet Chew 80 mg by mouth every 6 (six) hours as needed for flatulence.  . sucralfate (CARAFATE) 1 g tablet TAKE 1 TABLET (1 G TOTAL) BY MOUTH 4 (FOUR) TIMES DAILY - WITH MEALS AND AT BEDTIME.  [name] warfarin  (COUMADIN) 2 MG tablet TAKE 1 TABLET DAILY OR AS DIRECTED   No current facility-administered medications on file prior to visit.      Allergies  Allergen Reactions  . Adhesive [Tape] Other (See Comments)    Tears skin, Please use "paper" tape  . Amiodarone Other (See Comments)     Thyroid and liver and kidney problems  . Codeine Nausea And Vomiting  . Hydrocodone Other (See Comments)    hallucination  . Morphine And Related Nausea And Vomiting  . Requip [Ropinirole Hcl] Other (See Comments)    Headache   . Zinc Nausea Only    nausea  . Reglan [Metoclopramide] Hives, Itching and Rash    Made face red, also    Current Problems (  verified) Patient Active Problem List   Diagnosis Date Noted  . Atrial fibrillation with RVR ([place]) 10/20/2016  . Post-operative nausea and vomiting 06/03/2016  . Long term current use of anticoagulant therapy 03/19/2016  . No blood products 02/27/2016  . S/P partial gastrectomy 02/17/2016  . Post gastrectomy syndrome   . Gastroesophageal reflux disease with esophagitis   . Pressure ulcer 02/04/2016  . Persistent atrial fibrillation ([place]) 01/15/2016  . Protein-calorie malnutrition, moderate ([place]) 12/08/2015  . B12 deficiency 11/24/2015  . Iron deficiency anemia due to chronic blood loss 11/24/2015  . Refusal of blood transfusions as patient is Jehovah's Witness   . Adenocarcinoma of stomach ([place]) 11/11/2015  . CKD (chronic kidney disease), stage III 11/10/2015  . Vitamin B12 deficiency 11/09/2015  . Peripheral nerve disease 07/18/2015  . Rupture patellar tendon 07/29/2014  . Essential hypertension 06/04/2014  . Medication management 02/15/2014  . Prediabetes 09/24/2013  . Vitamin D deficiency 09/24/2013  . Hyperlipemia   . Cardiac pacemaker (06/2010) 08/26/2012  . FHx/o Colon Cancer 07/27/2011  . Aortic stenosis, moderate 07/09/2011  . S/P repair of patent ductus arteriosus 04/22/2011  . Atrial fibrillation ([place]) Chadsvasc 5 12/02/2010  .  Malignant neoplasm of kidney excluding renal pelvis ([place]) 11/17/2007  . Hypothyroidism 11/17/2007   SURGICAL HISTORY She  has a past surgical history that includes Cardiac catheterization (2008); Nephrectomy (Left); Patent ductus arterious repair (~ 1949); Cholecystectomy (1970s); Total knee arthroplasty (02/07/2012); Total hip revision (Right, 2009); I&D knee with poly exchange (Right, 12/17/2013); Patellar tendon repair (Right, 03/06/2014); Total knee revision (Right, 07/29/2014); Colonoscopy (N/A, 11/11/2015); Esophagogastroduodenoscopy (N/A, 11/11/2015); Total abdominal hysterectomy (85); laparoscopy (N/A, 01/15/2016); Gastrectomy (N/A, 01/15/2016); Pacemaker insertion (07/04/11); Esophagogastroduodenoscopy (N/A, 02/05/2016); Gastrectomy (Left, 02/24/2016); Esophagogastroduodenoscopy (N/A, 04/02/2016); Whipple procedure (N/A, 05/18/2016); Gastrojejunostomy (N/A, 05/18/2016); Lysis of adhesion (05/18/2016); and ir generic historical (06/17/2016). FAMILY HISTORY Her family history includes Colon cancer (age of onset: 66) in her mother; Heart attack in her father; Heart disease in her father, maternal grandmother, and mother. SOCIAL HISTORY She  reports that she has never smoked. She has never used smokeless tobacco. She reports that she does not drink alcohol or use drugs.   Review of Systems  Constitutional: Negative.   HENT: Negative.   Eyes: Negative.   Respiratory: Negative.   Cardiovascular: Negative.   Gastrointestinal: Negative.   Genitourinary: Negative.   Musculoskeletal: Negative.   Skin: Negative.   Neurological: Negative.   Endo/Heme/Allergies: Negative.   Psychiatric/Behavioral: Negative.      Objective:     Today's Vitals   03/23/17 1408  BP: (!) 148/78  Pulse: 81  Resp: 16  Temp: 97.9 F (36.6 C)  Weight: 107 lb 6.4 oz (48.7 kg)  Height: 5\' 1"  (1.549 m)   Body mass index is 20.29 kg/m.  General appearance: alert, no distress, WD/WN, female HEENT: normocephalic, sclerae  anicteric, TMs pearly, nares patent, no discharge or erythema, pharynx normal Oral cavity: MMM, no lesions Neck: supple, no lymphadenopathy, no thyromegaly, no masses Heart: RRR, normal S1, S2, no murmurs Lungs: CTA bilaterally, no wheezes, rhonchi, or rales Abdomen: + BS, soft, + tenderness RLQ with possible palpable mass, no rebound tenderness non distended, no masses, no hepatomegaly, no splenomegaly Musculoskeletal: nontender, no swelling, no obvious deformity, nontender back, no CVA tenderness Extremities: no edema, no cyanosis, no clubbing Pulses: 2+ symmetric, upper and lower extremities, normal cap refill Neurological: alert, oriented x 3, CN2-12 intact, strength normal upper extremities and lower extremities, sensation normal throughout, DTRs 2+ throughout, no cerebellar signs,  gait antalgic with cane Psychiatric: normal affect, behavior normal, pleasant    Vicie Mutters, PA-C   03/23/2017

## 2017-03-22 NOTE — Telephone Encounter (Signed)
3

## 2017-03-22 NOTE — Telephone Encounter (Signed)
Daughter [name] informed for mom to hold the coumadin 3 days per Dr Ralene Muskrat [name] for her upcoming procedure.  [name] verbalized understanding.

## 2017-03-22 NOTE — Telephone Encounter (Signed)
Dr [name] okayed 5 days, you requested 3 , please advise Sir, thank you.

## 2017-03-23 ENCOUNTER — Ambulatory Visit (INDEPENDENT_AMBULATORY_CARE_PROVIDER_SITE_OTHER): Payer: Medicare Other | Admitting: Physician Assistant

## 2017-03-23 ENCOUNTER — Encounter: Payer: Self-pay | Admitting: Physician Assistant

## 2017-03-23 VITALS — BP 148/78 | HR 81 | Temp 97.9°F | Resp 16 | Ht 61.0 in | Wt 107.4 lb

## 2017-03-23 DIAGNOSIS — N183 Chronic kidney disease, stage 3 unspecified: Secondary | ICD-10-CM

## 2017-03-23 DIAGNOSIS — Z79899 Other long term (current) drug therapy: Secondary | ICD-10-CM | POA: Diagnosis not present

## 2017-03-23 DIAGNOSIS — I4891 Unspecified atrial fibrillation: Secondary | ICD-10-CM

## 2017-03-23 DIAGNOSIS — C649 Malignant neoplasm of unspecified kidney, except renal pelvis: Secondary | ICD-10-CM | POA: Diagnosis not present

## 2017-03-23 DIAGNOSIS — E538 Deficiency of other specified B group vitamins: Secondary | ICD-10-CM | POA: Diagnosis not present

## 2017-03-23 DIAGNOSIS — R1031 Right lower quadrant pain: Secondary | ICD-10-CM

## 2017-03-23 DIAGNOSIS — E440 Moderate protein-calorie malnutrition: Secondary | ICD-10-CM | POA: Diagnosis not present

## 2017-03-23 DIAGNOSIS — E782 Mixed hyperlipidemia: Secondary | ICD-10-CM | POA: Diagnosis not present

## 2017-03-23 DIAGNOSIS — E039 Hypothyroidism, unspecified: Secondary | ICD-10-CM | POA: Diagnosis not present

## 2017-03-23 DIAGNOSIS — R634 Abnormal weight loss: Secondary | ICD-10-CM

## 2017-03-23 DIAGNOSIS — R39198 Other difficulties with micturition: Secondary | ICD-10-CM

## 2017-03-23 DIAGNOSIS — C169 Malignant neoplasm of stomach, unspecified: Secondary | ICD-10-CM

## 2017-03-23 LAB — CBC WITH DIFFERENTIAL/PLATELET
Basophils Absolute: 0 cells/uL (ref 0–200)
Basophils Relative: 0 %
EOS ABS: 198 {cells}/uL (ref 15–500)
Eosinophils Relative: 3 %
HEMATOCRIT: 42 % (ref 35.0–45.0)
HEMOGLOBIN: 13.7 g/dL (ref 11.7–15.5)
LYMPHS ABS: 2640 {cells}/uL (ref 850–3900)
Lymphocytes Relative: 40 %
MCH: 31.2 pg (ref 27.0–33.0)
MCHC: 32.6 g/dL (ref 32.0–36.0)
MCV: 95.7 fL (ref 80.0–100.0)
MONO ABS: 528 {cells}/uL (ref 200–950)
MPV: 10.6 fL (ref 7.5–12.5)
Monocytes Relative: 8 %
NEUTROS PCT: 49 %
Neutro Abs: 3234 cells/uL (ref 1500–7800)
Platelets: 212 10*3/uL (ref 140–400)
RBC: 4.39 MIL/uL (ref 3.80–5.10)
RDW: 13.6 % (ref 11.0–15.0)
WBC: 6.6 10*3/uL (ref 3.8–10.8)

## 2017-03-24 LAB — HEPATIC FUNCTION PANEL
ALT: 10 U/L (ref 6–29)
AST: 17 U/L (ref 10–35)
Albumin: 3.9 g/dL (ref 3.6–5.1)
Alkaline Phosphatase: 117 U/L (ref 33–130)
BILIRUBIN DIRECT: 0.1 mg/dL (ref <=0.2)
Indirect Bilirubin: 0.4 mg/dL (ref 0.2–1.2)
TOTAL PROTEIN: 6.5 g/dL (ref 6.1–8.1)
Total Bilirubin: 0.5 mg/dL (ref 0.2–1.2)

## 2017-03-24 LAB — URINE CULTURE

## 2017-03-24 LAB — URINALYSIS, ROUTINE W REFLEX MICROSCOPIC
Bilirubin Urine: NEGATIVE
Glucose, UA: NEGATIVE
Hgb urine dipstick: NEGATIVE
Ketones, ur: NEGATIVE
Leukocytes, UA: NEGATIVE
NITRITE: NEGATIVE
Protein, ur: NEGATIVE
SPECIFIC GRAVITY, URINE: 1.013 (ref 1.001–1.035)
pH: 5.5 (ref 5.0–8.0)

## 2017-03-24 LAB — LIPID PANEL
CHOL/HDL RATIO: 4.4 ratio (ref <5.0)
CHOLESTEROL: 144 mg/dL (ref <200)
HDL: 33 mg/dL — ABNORMAL LOW (ref >50)
LDL Cholesterol: 82 mg/dL (ref <100)
Triglycerides: 147 mg/dL (ref <150)
VLDL: 29 mg/dL (ref <30)

## 2017-03-24 LAB — BASIC METABOLIC PANEL WITH GFR
BUN: 12 mg/dL (ref 7–25)
CALCIUM: 9.1 mg/dL (ref 8.6–10.4)
CO2: 27 mmol/L (ref 20–31)
CREATININE: 0.92 mg/dL — AB (ref 0.60–0.88)
Chloride: 106 mmol/L (ref 98–110)
GFR, Est African American: 68 mL/min (ref >=60)
GFR, Est Non African American: 59 mL/min — ABNORMAL LOW (ref >=60)
Glucose, Bld: 79 mg/dL (ref 65–99)
Potassium: 3.5 mmol/L (ref 3.5–5.3)
SODIUM: 144 mmol/L (ref 135–146)

## 2017-03-24 LAB — TSH: TSH: 1.05 m[IU]/L

## 2017-03-24 LAB — MAGNESIUM: Magnesium: 1.7 mg/dL (ref 1.5–2.5)

## 2017-03-28 ENCOUNTER — Ambulatory Visit
Admission: RE | Admit: 2017-03-28 | Discharge: 2017-03-28 | Disposition: A | Discharge status: Home / Self Care | Payer: Medicare Other | Source: Ambulatory Visit | Attending: Physician Assistant | Admitting: Physician Assistant

## 2017-03-28 DIAGNOSIS — R39198 Other difficulties with micturition: Secondary | ICD-10-CM

## 2017-03-28 DIAGNOSIS — R1031 Right lower quadrant pain: Secondary | ICD-10-CM

## 2017-03-28 DIAGNOSIS — C541 Malignant neoplasm of endometrium: Secondary | ICD-10-CM | POA: Diagnosis not present

## 2017-03-28 DIAGNOSIS — R634 Abnormal weight loss: Secondary | ICD-10-CM

## 2017-03-28 DIAGNOSIS — N183 Chronic kidney disease, stage 3 unspecified: Secondary | ICD-10-CM

## 2017-03-28 DIAGNOSIS — C169 Malignant neoplasm of stomach, unspecified: Secondary | ICD-10-CM

## 2017-03-28 MED ORDER — IOPAMIDOL (ISOVUE-300) INJECTION 61%
80.0000 mL | Freq: Once | INTRAVENOUS | Status: AC | PRN
  Administered 2017-03-28: 80 mL via INTRAVENOUS

## 2017-04-01 ENCOUNTER — Ambulatory Visit (INDEPENDENT_AMBULATORY_CARE_PROVIDER_SITE_OTHER): Payer: Medicare Other | Admitting: *Deleted

## 2017-04-01 DIAGNOSIS — Z7901 Long term (current) use of anticoagulants: Secondary | ICD-10-CM

## 2017-04-01 DIAGNOSIS — I4891 Unspecified atrial fibrillation: Secondary | ICD-10-CM

## 2017-04-01 LAB — POCT INR: INR: 2.4

## 2017-04-01 NOTE — Patient Instructions (Addendum)
Last dose per Carlean Purl 04/02/17 for procedure on 04/06/17 Restart Coumadin after procedure or when MD instructs to do so.  Once you restart take an extra 1/2 tablet for 2 days then resume normal dose.  Call Coumadin Clinic with any questions [phone]

## 2017-04-06 ENCOUNTER — Encounter (HOSPITAL_COMMUNITY)
Admission: RE | Disposition: A | Discharge status: Home / Self Care | Payer: Self-pay | Source: Ambulatory Visit | Attending: Internal Medicine

## 2017-04-06 ENCOUNTER — Ambulatory Visit (HOSPITAL_COMMUNITY): Payer: Medicare Other | Admitting: Registered Nurse

## 2017-04-06 ENCOUNTER — Encounter (HOSPITAL_COMMUNITY): Payer: Self-pay | Admitting: *Deleted

## 2017-04-06 ENCOUNTER — Ambulatory Visit (HOSPITAL_COMMUNITY)
Admission: RE | Admit: 2017-04-06 | Discharge: 2017-04-06 | Disposition: A | Discharge status: Home / Self Care | Payer: Medicare Other | Source: Ambulatory Visit | Attending: Internal Medicine | Admitting: Internal Medicine

## 2017-04-06 DIAGNOSIS — Z8542 Personal history of malignant neoplasm of other parts of uterus: Secondary | ICD-10-CM | POA: Diagnosis not present

## 2017-04-06 DIAGNOSIS — N189 Chronic kidney disease, unspecified: Secondary | ICD-10-CM | POA: Insufficient documentation

## 2017-04-06 DIAGNOSIS — Z79899 Other long term (current) drug therapy: Secondary | ICD-10-CM | POA: Insufficient documentation

## 2017-04-06 DIAGNOSIS — R112 Nausea with vomiting, unspecified: Secondary | ICD-10-CM

## 2017-04-06 DIAGNOSIS — K219 Gastro-esophageal reflux disease without esophagitis: Secondary | ICD-10-CM | POA: Insufficient documentation

## 2017-04-06 DIAGNOSIS — Z8673 Personal history of transient ischemic attack (TIA), and cerebral infarction without residual deficits: Secondary | ICD-10-CM | POA: Diagnosis not present

## 2017-04-06 DIAGNOSIS — E785 Hyperlipidemia, unspecified: Secondary | ICD-10-CM | POA: Insufficient documentation

## 2017-04-06 DIAGNOSIS — R1110 Vomiting, unspecified: Secondary | ICD-10-CM | POA: Insufficient documentation

## 2017-04-06 DIAGNOSIS — G629 Polyneuropathy, unspecified: Secondary | ICD-10-CM | POA: Insufficient documentation

## 2017-04-06 DIAGNOSIS — Z794 Long term (current) use of insulin: Secondary | ICD-10-CM | POA: Diagnosis not present

## 2017-04-06 DIAGNOSIS — Z7901 Long term (current) use of anticoagulants: Secondary | ICD-10-CM | POA: Insufficient documentation

## 2017-04-06 DIAGNOSIS — R634 Abnormal weight loss: Secondary | ICD-10-CM

## 2017-04-06 DIAGNOSIS — I480 Paroxysmal atrial fibrillation: Secondary | ICD-10-CM | POA: Insufficient documentation

## 2017-04-06 DIAGNOSIS — E039 Hypothyroidism, unspecified: Secondary | ICD-10-CM | POA: Diagnosis not present

## 2017-04-06 DIAGNOSIS — Z885 Allergy status to narcotic agent status: Secondary | ICD-10-CM | POA: Insufficient documentation

## 2017-04-06 DIAGNOSIS — R110 Nausea: Secondary | ICD-10-CM | POA: Diagnosis not present

## 2017-04-06 DIAGNOSIS — Z85028 Personal history of other malignant neoplasm of stomach: Secondary | ICD-10-CM | POA: Diagnosis not present

## 2017-04-06 DIAGNOSIS — Z950 Presence of cardiac pacemaker: Secondary | ICD-10-CM | POA: Diagnosis not present

## 2017-04-06 DIAGNOSIS — Z96651 Presence of right artificial knee joint: Secondary | ICD-10-CM | POA: Insufficient documentation

## 2017-04-06 DIAGNOSIS — Z903 Acquired absence of stomach [part of]: Secondary | ICD-10-CM | POA: Diagnosis not present

## 2017-04-06 DIAGNOSIS — K9189 Other postprocedural complications and disorders of digestive system: Secondary | ICD-10-CM | POA: Diagnosis not present

## 2017-04-06 DIAGNOSIS — H548 Legal blindness, as defined in USA: Secondary | ICD-10-CM | POA: Diagnosis not present

## 2017-04-06 DIAGNOSIS — Z85528 Personal history of other malignant neoplasm of kidney: Secondary | ICD-10-CM | POA: Diagnosis not present

## 2017-04-06 DIAGNOSIS — Z9889 Other specified postprocedural states: Secondary | ICD-10-CM

## 2017-04-06 DIAGNOSIS — I2510 Atherosclerotic heart disease of native coronary artery without angina pectoris: Secondary | ICD-10-CM | POA: Diagnosis not present

## 2017-04-06 DIAGNOSIS — I129 Hypertensive chronic kidney disease with stage 1 through stage 4 chronic kidney disease, or unspecified chronic kidney disease: Secondary | ICD-10-CM | POA: Insufficient documentation

## 2017-04-06 DIAGNOSIS — F419 Anxiety disorder, unspecified: Secondary | ICD-10-CM | POA: Insufficient documentation

## 2017-04-06 DIAGNOSIS — G2581 Restless legs syndrome: Secondary | ICD-10-CM | POA: Diagnosis not present

## 2017-04-06 HISTORY — PX: ESOPHAGOGASTRODUODENOSCOPY (EGD) WITH PROPOFOL: SHX5813

## 2017-04-06 SURGERY — ESOPHAGOGASTRODUODENOSCOPY (EGD) WITH PROPOFOL
Anesthesia: Monitor Anesthesia Care

## 2017-04-06 MED ORDER — PROPOFOL 10 MG/ML IV BOLUS
INTRAVENOUS | Status: AC
  Filled 2017-04-06: qty 40

## 2017-04-06 MED ORDER — PROPOFOL 10 MG/ML IV BOLUS
INTRAVENOUS | Status: DC | PRN
  Administered 2017-04-06 (×5): 20 mg via INTRAVENOUS

## 2017-04-06 MED ORDER — SODIUM CHLORIDE 0.9 % IV SOLN: INTRAVENOUS | Status: DC

## 2017-04-06 MED ORDER — LACTATED RINGERS IV SOLN
INTRAVENOUS | Status: DC
  Administered 2017-04-06: 13:00:00 via INTRAVENOUS

## 2017-04-06 MED ORDER — LIDOCAINE 2% (20 MG/ML) 5 ML SYRINGE
INTRAMUSCULAR | Status: DC | PRN
  Administered 2017-04-06: 40 mg via INTRAVENOUS

## 2017-04-06 MED ORDER — LIDOCAINE 2% (20 MG/ML) 5 ML SYRINGE
INTRAMUSCULAR | Status: AC
  Filled 2017-04-06: qty 5

## 2017-04-06 SURGICAL SUPPLY — 14 items

## 2017-04-06 NOTE — Anesthesia Preprocedure Evaluation (Addendum)
Anesthesia Evaluation  Patient identified by MRN, date of birth, ID band Patient awake    Reviewed: Allergy & Precautions, NPO status , Patient's Chart, lab work & pertinent test results, reviewed documented beta blocker date and time   History of Anesthesia Complications (+) PONV and history of anesthetic complications  Airway Mallampati: II  TM Distance: >3 FB Neck ROM: Full    Dental  (+) Dental Advisory Given, Partial Upper   Pulmonary neg pulmonary ROS,    Pulmonary exam normal breath sounds clear to auscultation       Cardiovascular hypertension, Pt. on home beta blockers and Pt. on medications (-) angina+ CAD and + Peripheral Vascular Disease  (-) Past MI Normal cardiovascular exam+ dysrhythmias Atrial Fibrillation + pacemaker + Valvular Problems/Murmurs AS  Rhythm:Regular Rate:Normal     Neuro/Psych PSYCHIATRIC DISORDERS Anxiety  Neuromuscular disease CVA, Residual Symptoms    GI/Hepatic Neg liver ROS, GERD  Medicated and Controlled,Adenocarcinoma of stomach   Endo/Other  Hypothyroidism   Renal/GU Renal InsufficiencyRenal diseaseSingle kidney     Musculoskeletal  (+) Arthritis ,   Abdominal   Peds  Hematology  (+) Blood dyscrasia (Warfarin), , JEHOVAH'S WITNESS  Anesthesia Other Findings Day of surgery medications reviewed with the patient.  Reproductive/Obstetrics                           Anesthesia Physical Anesthesia Plan  ASA: III  Anesthesia Plan: MAC   Post-op Pain Management:    Induction: Intravenous  PONV Risk Score and Plan: 3 and Propofol infusion and Treatment may vary due to age or medical condition  Airway Management Planned: Nasal Cannula  Additional Equipment:   Intra-op Plan:   Post-operative Plan:   Informed Consent: I have reviewed the patients History and Physical, chart, labs and discussed the procedure including the risks, benefits and  alternatives for the proposed anesthesia with the patient or authorized representative who has indicated his/her understanding and acceptance.   Dental advisory given  Plan Discussed with: CRNA and Anesthesiologist  Anesthesia Plan Comments: (Discussed risks/benefits/alternatives to MAC sedation including need for ventilatory support, hypotension, need for conversion to general anesthesia.  All patient questions answered.  Patient/guardian wishes to proceed.)        Anesthesia Quick Evaluation

## 2017-04-06 NOTE — Anesthesia Procedure Notes (Signed)
Procedure Name: MAC Date/Time: 04/06/2017 1:26 PM Performed by: Talbot Grumbling Pre-anesthesia Checklist: Patient identified, Emergency Drugs available, Suction available and Patient being monitored Patient Re-evaluated:Patient Re-evaluated prior to induction Oxygen Delivery Method: Nasal cannula

## 2017-04-06 NOTE — Interval H&P Note (Signed)
History and Physical Interval Note:  04/06/2017 1:20 PM  [name]  has presented today for surgery, with the diagnosis of wt loss, nausea, vomiting, s/p partial gastrectomy  The various methods of treatment have been discussed with the patient and family. After consideration of risks, benefits and other options for treatment, the patient has consented to  Procedure(s): ESOPHAGOGASTRODUODENOSCOPY (EGD) WITH PROPOFOL (N/A) as a surgical intervention .  The patient's history has been reviewed, patient examined, no change in status, stable for surgery.  I have reviewed the patient's chart and labs.  Questions were answered to the patient's satisfaction.     Silvano Rusk

## 2017-04-06 NOTE — Op Note (Signed)
[facility] Patient Name: [name] Procedure Date: 04/06/2017 MRN: [id number] Attending MD: [name] , MD Date of Birth: 08-05-1934 CSN: [id number] Age: 83 Admit Type: Outpatient Procedure:                Upper GI endoscopy Indications:              Persistent vomiting, Weight loss Providers:                [name], MD, Burtis Junes, RN, Alan Mulder,                            Technician Referring MD:              Medicines:                Propofol per Anesthesia, Monitored Anesthesia Care Complications:            No immediate complications. Estimated Blood Loss:     Estimated blood loss: none. Procedure:                Pre-Anesthesia Assessment:                           - Prior to the procedure, a History and Physical                            was performed, and patient medications and                            allergies were reviewed. The patient's tolerance of                            previous anesthesia was also reviewed. The risks                            and benefits of the procedure and the sedation                            options and risks were discussed with the patient.                            All questions were answered, and informed consent                            was obtained. Prior Anticoagulants: The patient                            last took Coumadin (warfarin) 3 days prior to the                            procedure. ASA Grade Assessment: III - A patient                            with severe systemic disease. After reviewing the  risks and benefits, the patient was deemed in                            satisfactory condition to undergo the procedure.                           After obtaining informed consent, the endoscope was                            passed under direct vision. Throughout the                            procedure, the patient's blood pressure, pulse, and            oxygen saturations were monitored continuously. The                            EG-[id number] ([id number]) scope was introduced through the                            mouth, and advanced to the proximal jejunum. The                            upper GI endoscopy was accomplished without                            difficulty. The patient tolerated the procedure                            well. Scope In: Scope Out: Findings:      The examined esophagus was normal. Z line at 40 cm      A deformity was found in the cardia. Gastrojejunostomy at 44 cm      The examined jejunum was normal. Impression:               - Normal esophagus.                           - Post-surgical deformity in the cardia. as                            expected s/p gastrojejunostomy - I did not reach                            roux limb anastomosis                           - Normal examined jejunum.                           - No specimens collected. Moderate Sedation:      N/A- Per Anesthesia Care Recommendation:           - Patient has a contact number available for  emergencies. The signs and symptoms of potential                            delayed complications were discussed with the                            patient. Return to normal activities tomorrow.                            Written discharge instructions were provided to the                            patient.                           - Continue present medications.                           - Resume Coumadin (warfarin) at prior dose today.                            Refer to 1.5 x NL dose tonight and tomorrow for                            further adjustment of therapy.                           - Would consider trying mirtazapine- will discuss -                           - Resume previous diet [Duration]. Procedure Code(s):        --- Professional ---                           [id number], Esophagogastroduodenoscopy, flexible,                             transoral; diagnostic, including collection of                            specimen(s) by brushing or washing, when performed                            (separate procedure) Diagnosis Code(s):        --- Professional ---                           K91.89, Other postprocedural complications and                            disorders of digestive system                           R11.10, Vomiting, unspecified                           R63.4, Abnormal  weight loss CPT copyright 2016 American Medical Association. All rights reserved. The codes documented in this report are preliminary and upon coder review may  be revised to meet current compliance requirements. [name], MD 04/06/2017 1:59:01 PM This report has been signed electronically. Number of Addenda: 0

## 2017-04-06 NOTE — Transfer of Care (Signed)
Immediate Anesthesia Transfer of Care Note  Patient: [name]  Procedure(s) Performed: Procedure(s): ESOPHAGOGASTRODUODENOSCOPY (EGD) WITH PROPOFOL (N/A)  Patient Location: PACU  Anesthesia Type:MAC  Level of Consciousness:  sedated, patient cooperative and responds to stimulation  Airway & Oxygen Therapy:Patient Spontanous Breathing and Patient connected to face mask oxgen  Post-op Assessment:  Report given to PACU RN and Post -op Vital signs reviewed and stable  Post vital signs:  Reviewed and stable  Last Vitals:  Vitals:   04/06/17 1232  BP: (!) 211/61  Pulse: 67  Resp: 16  Temp: 43.8 C    Complications: No apparent anesthesia complications

## 2017-04-06 NOTE — H&P (View-Only) (Signed)
[name] 83 y.o. 14-May-1934 [id number]  Assessment & Plan:   Encounter Diagnoses  Name Primary?  . Post-operative nausea and vomiting Yes  . S/P partial gastrectomy   . Loss of weight   . History of gastric cancer      Schedule for upper endoscopy possible dilation of any strictures or stenoses that may be present postoperative period we'll hold warfarin 3 days before. He had this planned for August 1. Pending that I would consider repeating a CT scan in this patient prior to any other surgery.  I did explain risks benefits and indications of the procedures including the real but very rare risk of having embolic event and stroke or blood clot off of anticoagulation.  I appreciate the opportunity to care for this patient. CC: [name], MD Dr. Stark Klein  Subjective:   Chief Complaint:Vomiting  HPI The patient is here to request of 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 She was converted to a Roux-en-Y with disconnection of the stomach and duodenum, and has done better but is not holding her weight and is having some intermittent vomiting still. She's frequently nauseous and is unable to eat frequently. She has a very small gastric pouch left. Otherwise she is in good spirits and seems to be doing fairly well. She does take warfarin for stroke prophylaxis and atrial fibrillation. She is a Restaurant manager, fast food. Her daughter is here and participates in the history. Allergies  Allergen Reactions  . Adhesive [Tape] Other (See Comments)    Tears skin, Please use "paper" tape  . Amiodarone Other (See Comments)     Thyroid and liver and kidney problems  . Codeine Nausea And Vomiting  . Hydrocodone Other (See Comments)    hallucination  . Morphine And Related Nausea And Vomiting  . Requip [Ropinirole Hcl] Other (See Comments)    Headache   . Zinc Nausea Only    nausea  . Reglan  [Metoclopramide] Hives, Itching and Rash    Made face red, also   Current Meds  Medication Sig  . acetaminophen (TYLENOL) 500 MG tablet Take 500 mg by mouth every 6 (six) hours as needed for mild pain.  [name] ALPRAZolam (XANAX) 0.5 MG tablet TAKE 1/2-1 TABLET BY MOUTH 3 TIMES A DAY AS NEEDED FOR ANXIETY  . Biotin 10000 MCG TABS Take 1 tablet by mouth daily.  . Cholecalciferol (VITAMIN D PO) Take 5,000 Units by mouth daily.  . cyanocobalamin (,VITAMIN B-12,) 1000 MCG/ML injection INJECT 1/2 TO 1 ML INTO THE SKIN 1 TIME A MONTH.  [name] diltiazem (CARDIZEM CD) 180 MG 24 hr capsule Take 1 capsule (180 mg total) by mouth daily.  . furosemide (LASIX) 40 MG tablet Take 0.5 tablets (20 mg total) by mouth daily.  [name] gabapentin (NEURONTIN) 300 MG capsule Take 600 mg by mouth 2 (two) times daily.  . Insulin Syringe-Needle U-100 (B-D INSULIN SYRINGE 1CC/27G) 27G X 1/2" 1 ML MISC Use for injecting B-12 1 time a month.  . levothyroxine (SYNTHROID, LEVOTHROID) 100 MCG tablet TAKE 1 TABLET BY MOUTH EVERY DAY ON EMPTY STOMACH WITH A FULL GLASS OF WATER  . metoprolol (LOPRESSOR) 50 MG tablet Take 1 tablet (50 mg total) by mouth 2 (two) times daily.  . nitroGLYCERIN (NITROSTAT) 0.4 MG SL tablet Place 1 tablet (0.4 mg total) under the tongue every 5 (five) minutes as needed for chest pain (MAX 3 TABLETS).  . ondansetron (ZOFRAN) 4  MG tablet Take 4 mg by mouth every 8 (eight) hours as needed for nausea or vomiting.  . potassium chloride (KLOR-CON 10) 10 MEQ tablet Take 1 tablet (10 mEq total) by mouth 2 (two) times daily.  . ranitidine (ZANTAC) 150 MG tablet Take 150 mg by mouth 2 (two) times daily.  . simethicone (MYLICON) 80 MG chewable tablet Chew 80 mg by mouth every 6 (six) hours as needed for flatulence.  . sucralfate (CARAFATE) 1 g tablet TAKE 1 TABLET (1 G TOTAL) BY MOUTH 4 (FOUR) TIMES DAILY - WITH MEALS AND AT BEDTIME.  [name] warfarin (COUMADIN) 2 MG tablet TAKE 1 TABLET DAILY OR AS DIRECTED   Past Medical History:    Diagnosis Date  . Adenocarcinoma of stomach ([place]) 11/11/2015   gastric mass on egd  . Anal fissure   . Anemia    hx 3/17 iron infusion, on aranesp and injected B12 since 11/2015.   [name] Anxiety   . Aortic root dilatation ([place])   . Arthritis   . 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 CAD (coronary artery disease)   . Chronic kidney disease    only has one kidney rt lft rem 03 ca     dr. Risa Grill  . Elevated LFTs    hepatic steatosis  . Gastroparesis   . GERD (gastroesophageal reflux disease)   . History of uterine cancer 1980s   treated with hysterectomy, external radiation and radiation seed implants.   [name] HTN (hypertension)   . Hyperlipemia   . Hypothyroidism   . Moderate aortic stenosis   . Neuropathy    feet/legs  . No blood products 02/27/2016  . Osteomyelitis ([place]) as child  . PAF (paroxysmal atrial fibrillation) ([place])   . Radiation proctitis   . Restless leg syndrome   . Stroke [facility]) 15   partial stroke left legally blind  . Tachycardia-bradycardia syndrome ([place])    s/p PPM by Dr Doreatha Lew (MDT) 07/03/10   Past Surgical History:  Procedure Laterality Date  . CARDIAC CATHETERIZATION  2008  . CHOLECYSTECTOMY  1970s  . COLONOSCOPY N/A 11/11/2015   Procedure: COLONOSCOPY;  Surgeon: [name], MD;  Location: [facility];  Service: Endoscopy;  Laterality: N/A;  . ESOPHAGOGASTRODUODENOSCOPY N/A 11/11/2015   Procedure: ESOPHAGOGASTRODUODENOSCOPY (EGD);  Surgeon: [name], MD;  Location: [facility];  Service: Endoscopy;  Laterality: N/A;  . ESOPHAGOGASTRODUODENOSCOPY N/A 02/05/2016   Procedure: ESOPHAGOGASTRODUODENOSCOPY (EGD);  Surgeon: Ladene Artist, MD;  Location: [facility] ENDOSCOPY;  Service: Endoscopy;  Laterality: N/A;  . ESOPHAGOGASTRODUODENOSCOPY N/A 04/02/2016   Procedure: ESOPHAGOGASTRODUODENOSCOPY (EGD);  Surgeon: [name], MD;  Location: [facility];  Service: Endoscopy;  Laterality: N/A;  . GASTRECTOMY N/A 01/15/2016   Procedure: OPEN PARTIAL GASTRECTOMY;  Surgeon: Stark Klein, MD;  Location: [place];  Service: General;  Laterality: N/A;  . GASTRECTOMY Left 02/24/2016   Procedure: OPEN J TUBE;  Surgeon: Stark Klein, MD;  Location: [facility];  Service: General;  Laterality: Left;  [name] GASTROJEJUNOSTOMY N/A 05/18/2016   Procedure: ROUX EN Y GASTROJEJUNOSTOMY;  Surgeon: Stark Klein, MD;  Location: [place];  Service: General;  Laterality: N/A;  . I&D KNEE WITH POLY EXCHANGE Right 12/17/2013   Procedure: IRRIGATION AND DEBRIDEMEN RIGHT TOTAL  KNEE WITH POLY EXCHANGE;  Surgeon: Mauri Pole, MD;  Location: [facility];  Service: Orthopedics;  Laterality: Right;  . IR GENERIC HISTORICAL  06/17/2016   IR RADIOLOGIST EVAL & MGMT 06/17/2016 Ascencion Dike, PA-C [facility] INTERV RAD  . LAPAROSCOPY N/A  01/15/2016   Procedure: LAPAROSCOPY DIAGNOSTIC;  Surgeon: Stark Klein, MD;  Location: [place];  Service: General;  Laterality: N/A;  . LYSIS OF ADHESION  05/18/2016   Procedure: LYSIS OF ADHESION;  Surgeon: Stark Klein, MD;  Location: [place];  Service: General;;  . NEPHRECTOMY Left    renal cell cancer  . PACEMAKER INSERTION  07/04/11   MDT PPM implanted by Dr Doreatha Lew for tachy/brady  . PATELLAR TENDON REPAIR Right 03/06/2014   Procedure: AVULSION WITH PATELLA TENDON REPAIR;  Surgeon: Mauri Pole, MD;  Location: [facility];  Service: Orthopedics;  Laterality: Right;  . PATENT DUCTUS ARTERIOUS REPAIR  ~ 1949  . TOTAL ABDOMINAL HYSTERECTOMY  85  . TOTAL HIP REVISION Right 2009   initial replacment surg  . TOTAL KNEE ARTHROPLASTY  02/07/2012   Procedure: TOTAL KNEE ARTHROPLASTY;  Surgeon: Mauri Pole, MD;  [facility]: [facility];  Service: Orthopedics;  Laterality: Right;  . TOTAL KNEE REVISION Right 07/29/2014   Procedure: RIGHT KNEE REVISION OF PREVIOUS REPAIR EXTENSOR MECHANISM;  Surgeon: Mauri Pole, MD;  Location: [facility];  Service: Orthopedics;  Laterality: Right;  . WHIPPLE PROCEDURE N/A 05/18/2016   Procedure: EXPLORATORY LAPAROTOMY;  Surgeon: Stark Klein, MD;  Location: [place];  Service:  General;  Laterality: N/A;   Social History   Social History  . Marital status: Widowed   Social History Main Topics  . Smoking status: Never Smoker  . Smokeless tobacco: Never Used  . Alcohol use No  . Drug use: No  . Sexual activity: No    Social History Narrative   Lives in [place].  Continues to work.   family history includes Colon cancer (age of onset: 41) in her mother; Heart attack in her father; Heart disease in her father, maternal grandmother, and mother.   Review of Systems As per history of present illness she walks with a cane.  Objective:   Physical Exam BP 130/70 (BP Location: Left Arm, Patient Position: Sitting, Cuff Size: Normal)   Pulse 78   Ht 5\' 1"  (1.549 m)   Wt 102 lb 6.4 oz (46.4 kg)   SpO2 94%   BMI 19.35 kg/m  Spry elderly white woman in no acute distress Eyes are anicteric The lungs are clear Heart S1-S2 no rubs murmurs or gallops Abdomen soft Appropriate mood and affect an alert and oriented 3

## 2017-04-06 NOTE — Anesthesia Postprocedure Evaluation (Signed)
Anesthesia Post Note  Patient: SHAMECCA WHITEBREAD  Procedure(s) Performed: Procedure(s) (LRB): ESOPHAGOGASTRODUODENOSCOPY (EGD) WITH PROPOFOL (N/A)     Patient location during evaluation: Endoscopy Anesthesia Type: MAC Level of consciousness: awake and alert Pain management: pain level controlled Vital Signs Assessment: post-procedure vital signs reviewed and stable Respiratory status: spontaneous breathing, nonlabored ventilation and respiratory function stable Cardiovascular status: stable and blood pressure returned to baseline Anesthetic complications: no    Last Vitals:  Vitals:   04/06/17 1410 04/06/17 1420  BP: (!) 148/64 (!) 161/54  Pulse: 62 63  Resp: 16 19  Temp:      Last Pain:  Vitals:   04/06/17 1348  TempSrc: Oral                 Catalina Gravel

## 2017-04-06 NOTE — Discharge Instructions (Addendum)
° °  I think things look the way they are supposed to after the type of surgery you had. I will let [name] know. I think we should try a medication called mirtazipine that stimulates appetite, reduces nausea and helps people sleep better.   Please restart warfarin - take 1/2 extra dose tonight and tomorrow night then resume normal schedule.  I appreciate the opportunity to care for you. [name], MD, FACG  YOU HAD AN ENDOSCOPIC PROCEDURE TODAY: Refer to the procedure report and other information in the discharge instructions given to you for any specific questions about what was found during the examination. If this information does not answer your questions, please call Dr. Celesta Aver office at [phone] to clarify.   YOU SHOULD EXPECT: Some feelings of bloating in the abdomen. Passage of more gas than usual. Walking can help get rid of the air that was put into your GI tract during the procedure and reduce the bloating. If you had a lower endoscopy (such as a colonoscopy or flexible sigmoidoscopy) you may notice spotting of blood in your stool or on the toilet paper. Some abdominal soreness may be present for a day or two, also.  DIET: Your first meal following the procedure should be a light meal and then it is ok to progress to your normal diet. A half-sandwich or bowl of soup is an example of a good first meal. Heavy or fried foods are harder to digest and may make you feel nauseous or bloated. Drink plenty of fluids but you should avoid alcoholic beverages for 24 hours.   ACTIVITY: Your care partner should take you home directly after the procedure. You should plan to take it easy, moving slowly for the rest of the day. You can resume normal activity the day after the procedure however YOU SHOULD NOT DRIVE, use power tools, machinery or perform tasks that involve climbing or major physical exertion for 24 hours (because of the sedation medicines used during the test).    SYMPTOMS TO REPORT IMMEDIATELY: A gastroenterologist can be reached at any hour. Please call [phone]  for any of the following symptoms:   Following upper endoscopy (EGD, EUS, ERCP, esophageal dilation) Vomiting of blood or coffee ground material  New, significant abdominal pain  New, significant chest pain or pain under the shoulder blades  Painful or persistently difficult swallowing  New shortness of breath  Black, tarry-looking or red, bloody stools  FOLLOW UP:  If any biopsies were taken you will be contacted by phone or by letter within the next 1-3 weeks. Call [phone]  if you have not heard about the biopsies in 3 weeks.  Please also call with any specific questions about appointments or follow up tests.

## 2017-04-08 ENCOUNTER — Encounter (HOSPITAL_COMMUNITY): Payer: Self-pay | Admitting: Internal Medicine

## 2017-04-15 ENCOUNTER — Ambulatory Visit (INDEPENDENT_AMBULATORY_CARE_PROVIDER_SITE_OTHER): Payer: Medicare Other | Admitting: *Deleted

## 2017-04-15 ENCOUNTER — Telehealth: Payer: Self-pay

## 2017-04-15 DIAGNOSIS — I4891 Unspecified atrial fibrillation: Secondary | ICD-10-CM | POA: Diagnosis not present

## 2017-04-15 DIAGNOSIS — Z7901 Long term (current) use of anticoagulants: Secondary | ICD-10-CM

## 2017-04-15 LAB — POCT INR: INR: 1.8

## 2017-04-15 MED ORDER — MIRTAZAPINE 7.5 MG PO TABS: 7.5000 mg | ORAL_TABLET | Freq: Every day | ORAL | 3 refills | Status: DC

## 2017-04-15 NOTE — Telephone Encounter (Signed)
I left detailed voicemail message for [name] about the generic remeron sent in and to let us know if it makes her sleepy.  I also told her we need her to call back so we can make her an appointment for October.

## 2017-04-15 NOTE — Telephone Encounter (Signed)
-----   Message from [name], MD sent at 04/15/2017  1:39 PM EDT ----- Regarding: FW: EGD Please rx generic remeron 7.5 mg qhs # 30 3 RF  To help with nausea  If it makes her too sleepy let me know  OV me October ----- Message ----- From: Stark Klein, MD Sent: 04/07/2017   7:41 AM To: [name], MD Subject: RE: EGD                                        Ok, tx. fb  ----- Message ----- From: [name], MD Sent: 04/06/2017   2:04 PM To: Stark Klein, MD Subject: EGD                                            This looks like I think it should after what you did. Nothing to dilate. I think we should try remeron - I can Rx   [name]

## 2017-04-18 ENCOUNTER — Other Ambulatory Visit: Payer: Self-pay | Admitting: *Deleted

## 2017-04-18 MED ORDER — SUCRALFATE 1 G PO TABS: ORAL_TABLET | ORAL | 1 refills | Status: DC

## 2017-04-20 ENCOUNTER — Other Ambulatory Visit: Payer: Self-pay | Admitting: Physician Assistant

## 2017-04-21 ENCOUNTER — Other Ambulatory Visit: Payer: Self-pay | Admitting: Internal Medicine

## 2017-04-21 ENCOUNTER — Other Ambulatory Visit: Payer: Self-pay | Admitting: Physician Assistant

## 2017-04-26 ENCOUNTER — Ambulatory Visit: Payer: Medicare Other | Admitting: Internal Medicine

## 2017-04-27 ENCOUNTER — Ambulatory Visit (INDEPENDENT_AMBULATORY_CARE_PROVIDER_SITE_OTHER): Payer: Medicare Other

## 2017-04-27 DIAGNOSIS — I4891 Unspecified atrial fibrillation: Secondary | ICD-10-CM

## 2017-04-27 DIAGNOSIS — Z7901 Long term (current) use of anticoagulants: Secondary | ICD-10-CM

## 2017-04-27 LAB — POCT INR: INR: 2.2

## 2017-05-12 ENCOUNTER — Other Ambulatory Visit: Payer: Self-pay | Admitting: Internal Medicine

## 2017-05-26 ENCOUNTER — Other Ambulatory Visit: Payer: Self-pay | Admitting: Internal Medicine

## 2017-05-27 ENCOUNTER — Ambulatory Visit (INDEPENDENT_AMBULATORY_CARE_PROVIDER_SITE_OTHER): Payer: Medicare Other | Admitting: *Deleted

## 2017-05-27 DIAGNOSIS — I4891 Unspecified atrial fibrillation: Secondary | ICD-10-CM

## 2017-05-27 DIAGNOSIS — Z7901 Long term (current) use of anticoagulants: Secondary | ICD-10-CM

## 2017-05-27 DIAGNOSIS — Z5181 Encounter for therapeutic drug level monitoring: Secondary | ICD-10-CM | POA: Diagnosis not present

## 2017-05-27 LAB — POCT INR: INR: 1.9

## 2017-05-30 ENCOUNTER — Ambulatory Visit (INDEPENDENT_AMBULATORY_CARE_PROVIDER_SITE_OTHER): Payer: Medicare Other | Admitting: *Deleted

## 2017-05-30 ENCOUNTER — Telehealth: Payer: Self-pay | Admitting: Cardiology

## 2017-05-30 DIAGNOSIS — I495 Sick sinus syndrome: Secondary | ICD-10-CM

## 2017-05-30 NOTE — Progress Notes (Signed)
Remote pacemaker transmission.   

## 2017-05-30 NOTE — Telephone Encounter (Signed)
Confirmed remote transmission w/ pt daughter.   

## 2017-05-31 LAB — CUP PACEART REMOTE DEVICE CHECK
Battery Remaining Longevity: 67 mo
Brady Statistic AP VP Percent: 0 %
Brady Statistic AS VP Percent: 0 %
Brady Statistic AS VS Percent: 25 %
Date Time Interrogation Session: 20180924173344
Implantable Lead Implant Date: 20111027
Implantable Lead Location: 753859
Implantable Lead Location: 753860
Implantable Lead Serial Number: 686026
Implantable Pulse Generator Implant Date: 20111027
Lead Channel Impedance Value: 401 Ohm
Lead Channel Impedance Value: 506 Ohm
Lead Channel Pacing Threshold Amplitude: 1.375 V
Lead Channel Pacing Threshold Pulse Width: 0.4 ms
Lead Channel Setting Pacing Amplitude: 2 V
MDC IDC LEAD IMPLANT DT: 20111027
MDC IDC LEAD SERIAL: 548229
MDC IDC MSMT BATTERY IMPEDANCE: 926 Ohm
MDC IDC MSMT BATTERY VOLTAGE: 2.79 V
MDC IDC MSMT LEADCHNL RA PACING THRESHOLD AMPLITUDE: 0.375 V
MDC IDC MSMT LEADCHNL RA PACING THRESHOLD PULSEWIDTH: 0.4 ms
MDC IDC SET LEADCHNL RV PACING AMPLITUDE: 2.75 V
MDC IDC SET LEADCHNL RV PACING PULSEWIDTH: 0.4 ms
MDC IDC SET LEADCHNL RV SENSING SENSITIVITY: 2 mV
MDC IDC STAT BRADY AP VS PERCENT: 75 %

## 2017-06-02 ENCOUNTER — Encounter: Payer: Self-pay | Admitting: Cardiology

## 2017-06-09 ENCOUNTER — Ambulatory Visit: Payer: Self-pay | Admitting: Internal Medicine

## 2017-06-15 ENCOUNTER — Other Ambulatory Visit: Payer: Self-pay | Admitting: *Deleted

## 2017-06-15 ENCOUNTER — Encounter: Payer: Self-pay | Admitting: Internal Medicine

## 2017-06-15 ENCOUNTER — Ambulatory Visit (INDEPENDENT_AMBULATORY_CARE_PROVIDER_SITE_OTHER): Payer: Medicare Other | Admitting: Internal Medicine

## 2017-06-15 VITALS — BP 150/78 | HR 68 | Ht 58.75 in | Wt 103.4 lb

## 2017-06-15 DIAGNOSIS — K911 Postgastric surgery syndromes: Secondary | ICD-10-CM

## 2017-06-15 DIAGNOSIS — K912 Postsurgical malabsorption, not elsewhere classified: Secondary | ICD-10-CM

## 2017-06-15 MED ORDER — ONDANSETRON 4 MG PO TBDP: 4.0000 mg | ORAL_TABLET | Freq: Three times a day (TID) | ORAL | 0 refills | Status: DC | PRN

## 2017-06-15 MED ORDER — ONDANSETRON HCL 4 MG PO TABS: 4.0000 mg | ORAL_TABLET | Freq: Three times a day (TID) | ORAL | 1 refills | Status: DC | PRN

## 2017-06-15 NOTE — Assessment & Plan Note (Signed)
Post gastrectomy anatomy puts her risk a B12 and iron malabsorption she's been deficient in both. She is on B12 will add oral iron supplementation. She has had Feraheme in the past and could need it again depending upon how she does. Will defer to primary care to follow-up anemia.

## 2017-06-15 NOTE — Assessment & Plan Note (Signed)
Improved Needs ferrous sulfate supplementation as she is at risk for malabsorption

## 2017-06-15 NOTE — Patient Instructions (Signed)
If you are age 81 or older, your body mass index should be between 23-30. Your Body mass index is 21.06 kg/m. If this is out of the aforementioned range listed, please consider follow up with your Primary Care Provider.  If you are age 81 or younger, your body mass index should be between 19-25. Your Body mass index is 21.06 kg/m. If this is out of the aformentioned range listed, please consider follow up with your Primary Care Provider.   Please purchase over the counter Ferrous Sulfate (iron) 325 mg  Take 1 tablet by mouth daily.  Please follow up with Dr Carlean Purl as needed.  Thank You,  Silvano Rusk, MD. Marval Regal

## 2017-06-15 NOTE — Progress Notes (Signed)
[name] 83 y.o. 06-16-1934 [id number]  Assessment & Plan:  Post gastrectomy syndrome Improved Needs ferrous sulfate supplementation as she is at risk for malabsorption  Postsurgical malabsorption Post gastrectomy anatomy puts her risk a B12 and iron malabsorption she's been deficient in both. She is on B12 will add oral iron supplementation. She has had Feraheme in the past and could need it again depending upon how she does. Will defer to primary care to follow-up anemia.   She will see me as needed.  I appreciate the opportunity to care for this patient. CC: [name], MD Dr. Stark Klein Subjective:   Chief Complaint:Follow-up of vomiting after gastrectomy  HPI The patient is an elderly white woman here with her daughter, she is status post subtotal gastrectomy for a proximal adenocarcinoma the stomach in May 2017 and then she subsequently underwent a Roux-en-Y gastrojejunostomy with disconnection of antrum from duodenum because of persistent postoperative vomiting. Over time she has struggled with persistent symptoms but now if she eats slowly and doesn't overeat she will not vomit. She takes ondansetron with benefit pre-meal. Weight is stabilizing. She is performing activities of daily living and says her quality of life is good. She is taking B12 shots, it looks like she said Feraheme in the past and a CBC was normal earlier this year but she is not on any iron supplementation now that I can tell. I tried her on mirtazapine after an endoscopy did not show any abnormalities except for postoperative changes this summer. That made her very sleepy and loopy and she stopped it. Fortunately she has leveled off and is doing well all things considered. Wt Readings from Last 3 Encounters:  06/15/17 103 lb 6 oz (46.9 kg)  04/06/17 102 lb (46.3 kg)  03/23/17 107 lb 6.4 oz (48.7 kg)   Allergies  Allergen Reactions  . Adhesive [Tape] Other (See Comments)    Tears skin,  Please use "paper" tape  . Amiodarone Other (See Comments)     Thyroid and liver and kidney problems  . Codeine Nausea And Vomiting  . Hydrocodone Other (See Comments)    hallucination  . Morphine And Related Nausea And Vomiting  . Remeron [Mirtazapine] Other (See Comments)    Hallucinations and make pt loopy  . Requip [Ropinirole Hcl] Other (See Comments)    Headache   . Zinc Nausea Only    nausea  . Reglan [Metoclopramide] Hives, Itching and Rash    Made face red, also   Current Meds  Medication Sig  . acetaminophen (TYLENOL) 500 MG tablet Take 500 mg by mouth every 6 (six) hours as needed for mild pain.  [name] ALPRAZolam (XANAX) 0.5 MG tablet TAKE 1/2-1 TABLET BY MOUTH 3 TIMES A DAY AS NEEDED FOR ANXIETY  . Biotin 10000 MCG TABS Take 1 tablet by mouth daily.  . Cholecalciferol (VITAMIN D PO) Take 5,000 Units by mouth daily.  . cyanocobalamin (,VITAMIN B-12,) 1000 MCG/ML injection INJECT 1/2 TO 1 ML INTO THE SKIN 1 TIME A MONTH.  [name] diltiazem (CARDIZEM CD) 180 MG 24 hr capsule TAKE 1 CAPSULE (180 MG TOTAL) BY MOUTH DAILY.  . furosemide (LASIX) 40 MG tablet TAKE 1/2 TABLET EVERY DAY  . gabapentin (NEURONTIN) 300 MG capsule Take 600 mg by mouth 2 (two) times daily.  . Insulin Syringe-Needle U-100 (B-D INSULIN SYRINGE 1CC/27G) 27G X 1/2" 1 ML MISC Use for injecting B-12 1 time a month.  [name] KLOR-CON 10 10 MEQ tablet TAKE 1 TABLET (10  MEQ TOTAL) BY MOUTH 2 (TWO) TIMES DAILY.  [name] levothyroxine (SYNTHROID, LEVOTHROID) 100 MCG tablet Take 50 mcg by mouth daily before breakfast.  . metoprolol (LOPRESSOR) 50 MG tablet Take 1 tablet (50 mg total) by mouth 2 (two) times daily.  . nitroGLYCERIN (NITROSTAT) 0.4 MG SL tablet Place 1 tablet (0.4 mg total) under the tongue every 5 (five) minutes as needed for chest pain (MAX 3 TABLETS).  . ranitidine (ZANTAC) 150 MG tablet Take 150 mg by mouth 2 (two) times daily.  . simethicone (MYLICON) 80 MG chewable tablet Chew 80 mg by mouth every 6 (six) hours as  needed for flatulence.  . sucralfate (CARAFATE) 1 g tablet TAKE 1 TABLET (1 G TOTAL) BY MOUTH 4 (FOUR) TIMES DAILY - WITH MEALS AND AT BEDTIME.  [name] warfarin (COUMADIN) 2 MG tablet TAKE 1 TABLET DAILY OR AS DIRECTED  . [DISCONTINUED] ondansetron (ZOFRAN) 4 MG tablet Take 4 mg by mouth every 8 (eight) hours as needed for nausea or vomiting.    Review of Systems As per history of present illness  Objective:   Physical Exam BP (!) 150/78 (BP Location: Left Arm, Patient Position: Sitting, Cuff Size: Normal)   Pulse 68   Ht 4' 10.75" (1.492 m) Comment: eight measured without shoes  Wt 103 lb 6 oz (46.9 kg)   BMI 21.06 kg/m  Elderly white woman smiling cheerful no acute distress  15 minutes time spent with patient > half in counseling coordination of care

## 2017-06-23 ENCOUNTER — Ambulatory Visit (INDEPENDENT_AMBULATORY_CARE_PROVIDER_SITE_OTHER): Payer: Medicare Other | Admitting: Internal Medicine

## 2017-06-23 VITALS — BP 120/62 | HR 76 | Temp 97.7°F | Resp 16 | Ht 58.75 in | Wt 104.0 lb

## 2017-06-23 DIAGNOSIS — E039 Hypothyroidism, unspecified: Secondary | ICD-10-CM

## 2017-06-23 DIAGNOSIS — Z23 Encounter for immunization: Secondary | ICD-10-CM

## 2017-06-23 DIAGNOSIS — N183 Chronic kidney disease, stage 3 unspecified: Secondary | ICD-10-CM

## 2017-06-23 DIAGNOSIS — E782 Mixed hyperlipidemia: Secondary | ICD-10-CM

## 2017-06-23 DIAGNOSIS — E559 Vitamin D deficiency, unspecified: Secondary | ICD-10-CM

## 2017-06-23 DIAGNOSIS — R7303 Prediabetes: Secondary | ICD-10-CM | POA: Diagnosis not present

## 2017-06-23 DIAGNOSIS — I10 Essential (primary) hypertension: Secondary | ICD-10-CM

## 2017-06-23 NOTE — Progress Notes (Signed)
This very nice 83 y.o. WWFD presents for 3 month follow up with Hypertension, ASHD/pAfib/AoS, Hyperlipidemia, Pre-Diabetes and Vitamin D Deficiency. Patient also has CKD3 consequent of her long-standing HTN.     Other significant hx is Ca of Uterus (1994) , Lt Renal transitional cell Ca (2003) and more recently resection of a Gastric Ca in 2017 by Roux-en-Y and she's had a very slow protracted recovery.      Patient is treated for HTN (1960's) & BP has been controlled at home. Today's BP is at goal - 120/62.  Patient has ASHD & AoS and was anticoagulated in 2008 for pAfib and in 2011 had a PPM implanted for SSS. Patient has had no complaints of any cardiac type chest pain, palpitations, dyspnea / orthopnea / PND, dizziness, claudication, or dependent edema.     Hyperlipidemia is controlled with diet & meds. Patient denies myalgias or other med SE's. Last Lipids were controlled with diet: Lab Results  Component Value Date   CHOL 144 03/23/2017   HDL 33 (L) 03/23/2017   LDLCALC 82 03/23/2017   TRIG 147 03/23/2017   CHOLHDL 4.4 03/23/2017      Patient has Hypothyroidism predating since the 1990's. Also, the patient has history of PreDiabetes/Insulin Resistance (2012)  and has had no symptoms of reactive hypoglycemia, diabetic polys, paresthesias or visual blurring.  Last A1c was normal and at goal: Lab Results  Component Value Date   HGBA1C 4.7 11/26/2016      Further, the patient also has history of Vitamin D Deficiency  Of "19" in 2008 and supplements vitamin D without any suspected side-effects. Last vitamin D was at goal:   Lab Results  Component Value Date   VD25OH 59 11/26/2016   Current Outpatient Prescriptions on File Prior to Visit  Medication Sig  . acetaminophen (TYLENOL) 500 MG tablet Take 500 mg by mouth every 6 (six) hours as needed for mild pain.  [name] ALPRAZolam (XANAX) 0.5 MG tablet TAKE 1/2-1 TABLET BY MOUTH 3 TIMES A DAY AS NEEDED FOR ANXIETY  . Biotin 10000 MCG TABS  Take 1 tablet by mouth daily.  . Cholecalciferol (VITAMIN D PO) Take 5,000 Units by mouth daily.  . cyanocobalamin (,VITAMIN B-12,) 1000 MCG/ML injection INJECT 1/2 TO 1 ML INTO THE SKIN 1 TIME A MONTH.  [name] diltiazem (CARDIZEM CD) 180 MG 24 hr capsule TAKE 1 CAPSULE (180 MG TOTAL) BY MOUTH DAILY.  . furosemide (LASIX) 40 MG tablet TAKE 1/2 TABLET EVERY DAY  . gabapentin (NEURONTIN) 300 MG capsule Take 600 mg by mouth 2 (two) times daily.  . Insulin Syringe-Needle U-100 (B-D INSULIN SYRINGE 1CC/27G) 27G X 1/2" 1 ML MISC Use for injecting B-12 1 time a month.  [name] KLOR-CON 10 10 MEQ tablet TAKE 1 TABLET (10 MEQ TOTAL) BY MOUTH 2 (TWO) TIMES DAILY.  [name] levothyroxine (SYNTHROID, LEVOTHROID) 100 MCG tablet Take 50 mcg by mouth daily before breakfast.  . metoprolol (LOPRESSOR) 50 MG tablet Take 1 tablet (50 mg total) by mouth 2 (two) times daily.  . nitroGLYCERIN (NITROSTAT) 0.4 MG SL tablet Place 1 tablet (0.4 mg total) under the tongue every 5 (five) minutes as needed for chest pain (MAX 3 TABLETS).  . ondansetron (ZOFRAN-ODT) 4 MG disintegrating tablet Take 1 tablet (4 mg total) by mouth every 8 (eight) hours as needed for nausea or vomiting.  . ranitidine (ZANTAC) 150 MG tablet Take 150 mg by mouth 2 (two) times daily.  . simethicone (MYLICON) 80 MG  chewable tablet Chew 80 mg by mouth every 6 (six) hours as needed for flatulence.  . sucralfate (CARAFATE) 1 g tablet TAKE 1 TABLET (1 G TOTAL) BY MOUTH 4 (FOUR) TIMES DAILY - WITH MEALS AND AT BEDTIME.  [name] warfarin (COUMADIN) 2 MG tablet TAKE 1 TABLET DAILY OR AS DIRECTED   No current facility-administered medications on file prior to visit.    Allergies  Allergen Reactions  . Adhesive [Tape] Other (See Comments)    Tears skin, Please use "paper" tape  . Amiodarone Other (See Comments)     Thyroid and liver and kidney problems  . Codeine Nausea And Vomiting  . Hydrocodone Other (See Comments)    hallucination  . Morphine And Related Nausea And  Vomiting  . Remeron [Mirtazapine] Other (See Comments)    Hallucinations and make pt loopy  . Requip [Ropinirole Hcl] Other (See Comments)    Headache   . Zinc Nausea Only    nausea  . Reglan [Metoclopramide] Hives, Itching and Rash    Made face red, also   PMHx:   Past Medical History:  Diagnosis Date  . Adenocarcinoma of stomach ([place]) 11/11/2015   gastric mass on egd  . Anal fissure   . Anemia    hx 3/17 iron infusion, on aranesp and injected B12 since 11/2015.   [name] Anxiety   . Aortic root dilatation ([place])   . Arthritis   . 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 CAD (coronary artery disease)   . Chronic kidney disease    only has one kidney rt lft rem 03 ca     dr. Risa Grill  . Elevated LFTs    hepatic steatosis  . Gastroparesis   . GERD (gastroesophageal reflux disease)   . History of uterine cancer 1980s   treated with hysterectomy, external radiation and radiation seed implants.   [name] HTN (hypertension)   . Hyperlipemia   . Hypothyroidism   . Iron deficiency anemia due to chronic blood loss 11/24/2015  . Moderate aortic stenosis   . Neuropathy    feet/legs  . No blood products 02/27/2016  . Osteomyelitis ([place]) as child  . PAF (paroxysmal atrial fibrillation) ([place])   . PONV (postoperative nausea and vomiting)    long time ago  . Radiation proctitis   . Restless leg syndrome   . Stroke [facility]) 15   partial stroke left legally blind  . Tachycardia-bradycardia syndrome ([place])    s/p PPM by Dr Doreatha Lew (MDT) 07/03/10   Immunization History  Administered Date(s) Administered  . Influenza, High Dose Seasonal PF 07/18/2015  . Influenza,inj,Quad PF,6+ Mos 05/31/2014, 06/09/2016  . Influenza-Unspecified 09/16/2009, 07/03/2013  . Pneumococcal Conjugate-13 09/06/2002  . Pneumococcal Polysaccharide-23 07/18/2015  . Td 09/06/2002  . Zoster 09/06/2008   Past Surgical History:  Procedure Laterality Date  . CARDIAC CATHETERIZATION  2008  . CHOLECYSTECTOMY  1970s  . COLONOSCOPY N/A 11/11/2015     Procedure: COLONOSCOPY;  Surgeon: [name], MD;  Location: [facility];  Service: Endoscopy;  Laterality: N/A;  . ESOPHAGOGASTRODUODENOSCOPY N/A 11/11/2015   Procedure: ESOPHAGOGASTRODUODENOSCOPY (EGD);  Surgeon: [name], MD;  Location: [facility];  Service: Endoscopy;  Laterality: N/A;  . ESOPHAGOGASTRODUODENOSCOPY N/A 02/05/2016   Procedure: ESOPHAGOGASTRODUODENOSCOPY (EGD);  Surgeon: Ladene Artist, MD;  Location: [facility] ENDOSCOPY;  Service: Endoscopy;  Laterality: N/A;  . ESOPHAGOGASTRODUODENOSCOPY N/A 04/02/2016   Procedure: ESOPHAGOGASTRODUODENOSCOPY (EGD);  Surgeon: [name], MD;  Location: [facility];  Service: Endoscopy;  Laterality: N/A;  . ESOPHAGOGASTRODUODENOSCOPY (  EGD) WITH PROPOFOL N/A 04/06/2017   Procedure: ESOPHAGOGASTRODUODENOSCOPY (EGD) WITH PROPOFOL;  Surgeon: [name], MD;  Location: [facility];  Service: Endoscopy;  Laterality: N/A;  . GASTRECTOMY N/A 01/15/2016   Procedure: OPEN PARTIAL GASTRECTOMY;  Surgeon: Stark Klein, MD;  Location: [place];  Service: General;  Laterality: N/A;  . GASTRECTOMY Left 02/24/2016   Procedure: OPEN J TUBE;  Surgeon: Stark Klein, MD;  Location: [place];  Service: General;  Laterality: Left;  [name] GASTROJEJUNOSTOMY N/A 05/18/2016   Procedure: ROUX EN Y GASTROJEJUNOSTOMY;  Surgeon: Stark Klein, MD;  Location: [place];  Service: General;  Laterality: N/A;  . I&D KNEE WITH POLY EXCHANGE Right 12/17/2013   Procedure: IRRIGATION AND DEBRIDEMEN RIGHT TOTAL  KNEE WITH POLY EXCHANGE;  Surgeon: Mauri Pole, MD;  Location: [facility];  Service: Orthopedics;  Laterality: Right;  . IR GENERIC HISTORICAL  06/17/2016   IR RADIOLOGIST EVAL & MGMT 06/17/2016 Ascencion Dike, PA-C [facility] INTERV RAD  . LAPAROSCOPY N/A 01/15/2016   Procedure: LAPAROSCOPY DIAGNOSTIC;  Surgeon: Stark Klein, MD;  Location: [place];  Service: General;  Laterality: N/A;  . LYSIS OF ADHESION  05/18/2016   Procedure: LYSIS OF ADHESION;  Surgeon: Stark Klein, MD;  Location:  [place];  Service: General;;  . NEPHRECTOMY Left    renal cell cancer  . PACEMAKER INSERTION  07/04/11   [facility] PPM implanted by Dr Doreatha Lew for tachy/brady  . PATELLAR TENDON REPAIR Right 03/06/2014   Procedure: AVULSION WITH PATELLA TENDON REPAIR;  Surgeon: Mauri Pole, MD;  Location: [facility];  Service: Orthopedics;  Laterality: Right;  . PATENT DUCTUS ARTERIOUS REPAIR  ~ 1949  . TOTAL ABDOMINAL HYSTERECTOMY  85  . TOTAL HIP REVISION Right 2009   initial replacment surg  . TOTAL KNEE ARTHROPLASTY  02/07/2012   Procedure: TOTAL KNEE ARTHROPLASTY;  Surgeon: Mauri Pole, MD;  [facility];  Service: Orthopedics;  Laterality: Right;  . TOTAL KNEE REVISION Right 07/29/2014   Procedure: RIGHT KNEE REVISION OF PREVIOUS REPAIR EXTENSOR MECHANISM;  Surgeon: Mauri Pole, MD;  Location: [facility];  Service: Orthopedics;  Laterality: Right;  . WHIPPLE PROCEDURE N/A 05/18/2016   Procedure: EXPLORATORY LAPAROTOMY;  Surgeon: Stark Klein, MD;  Location: [place];  Service: General;  Laterality: N/A;   FHx:    Reviewed / unchanged  SHx:    Reviewed / unchanged  Systems Review:  Constitutional: Denies fever, chills, wt changes, headaches, insomnia, fatigue, night sweats, change in appetite. Eyes: Denies redness, blurred vision, diplopia, discharge, itchy, watery eyes.  ENT: Denies discharge, congestion, post nasal drip, epistaxis, sore throat, earache, hearing loss, dental pain, tinnitus, vertigo, sinus pain, snoring.  CV: Denies chest pain, palpitations, irregular heartbeat, syncope, dyspnea, diaphoresis, orthopnea, PND, claudication or edema. Respiratory: denies cough, dyspnea, DOE, pleurisy, hoarseness, laryngitis, wheezing.  Gastrointestinal: Denies dysphagia, odynophagia, heartburn, reflux, water brash, abdominal pain or cramps, nausea, vomiting, bloating, diarrhea, constipation, hematemesis, melena, hematochezia  or hemorrhoids. Genitourinary: Denies dysuria, frequency, urgency, nocturia, hesitancy,  discharge, hematuria or flank pain. Musculoskeletal: Denies arthralgias, myalgias, stiffness, jt. swelling, pain, limping or strain/sprain.  Skin: Denies pruritus, rash, hives, warts, acne, eczema or change in skin lesion(s). Neuro: No weakness, tremor, incoordination, spasms, paresthesia or pain. Psychiatric: Denies confusion, memory loss or sensory loss. Endo: Denies change in weight, skin or hair change.  Heme/Lymph: No excessive bleeding, bruising or enlarged lymph nodes.  Physical Exam  BP 120/62   Pulse 76   Temp 97.7 F (36.5 C)   Resp  16   Ht 4' 10.75" (1.492 m)   Wt 104 lb (47.2 kg)   BMI 21.18 kg/m   Appears well nourished, well groomed  and in no distress.  Eyes: PERRLA, EOMs, conjunctiva no swelling or erythema. Sinuses: No frontal/maxillary tenderness ENT/Mouth: EAC's clear, TM's nl w/o erythema, bulging. Nares clear w/o erythema, swelling, exudates. Oropharynx clear without erythema or exudates. Oral hygiene is good. Tongue normal, non obstructing. Hearing intact.  Neck: Supple. Thyroid nl. Car 2+/2+ without bruits, nodes or JVD. Chest: Respirations nl with BS clear & equal w/o rales, rhonchi, wheezing or stridor.  Cor: Heart sounds normal w/ regular rate and rhythm without sig. murmurs, gallops, clicks or rubs. Peripheral pulses normal and equal  without edema.  Abdomen: Soft & bowel sounds normal. Non-tender w/o guarding, rebound, hernias, masses or organomegaly.  Lymphatics: Unremarkable.  Musculoskeletal: Full ROM all peripheral extremities, joint stability, 5/5 strength and normal gait.  Skin: Warm, dry without exposed rashes, lesions or ecchymosis apparent.  Neuro: Cranial nerves intact, reflexes equal bilaterally. Sensory-motor testing grossly intact. Tendon reflexes grossly intact.  Pysch: Alert & oriented x 3.  Insight and judgement nl & appropriate. No ideations.  Assessment and Plan:  1. Essential hypertension  - Continue medication, monitor blood  pressure at home.  - Continue DASH diet. Reminder to go to the ER if any CP,  SOB, nausea, dizziness, severe HA, changes vision/speech.  - CBC with Differential/Platelet - BASIC METABOLIC PANEL WITH GFR - Magnesium - TSH  2. Hyperlipidemia, mixed  - Continue diet/meds, exercise,& lifestyle modifications.  - Continue monitor periodic cholesterol/liver & renal functions   - Hepatic function panel - Lipid panel - TSH  3. Prediabetes  - Continue diet, exercise, lifestyle modifications.  - Monitor appropriate labs.  - Hemoglobin A1c - Insulin, random  4. Vitamin D deficiency  - Continue supplementation.  - VITAMIN D 25 Hydroxy  5. Hypothyroidism  - CBC with Differential/Platelet - BASIC METABOLIC PANEL WITH GFR - Hepatic function panel - Magnesium - Lipid panel - TSH - Hemoglobin A1c - Insulin, random - VITAMIN D 25 Hydroxy  6. CKD3   - Monitoring   7. Need for immunization against influenza  - Flu vaccine HIGH DOSE PF (Fluzone High dose)           Discussed  regular exercise, BP monitoring, weight control to achieve/maintain BMI less than 25 and discussed med and SE's. Recommended labs to assess and monitor clinical status with further disposition pending results of labs. Over 30 minutes of exam, counseling, chart review was performed.

## 2017-06-23 NOTE — Patient Instructions (Signed)

## 2017-06-24 ENCOUNTER — Ambulatory Visit (INDEPENDENT_AMBULATORY_CARE_PROVIDER_SITE_OTHER): Payer: Medicare Other | Admitting: *Deleted

## 2017-06-24 DIAGNOSIS — I481 Persistent atrial fibrillation: Secondary | ICD-10-CM

## 2017-06-24 DIAGNOSIS — Z5181 Encounter for therapeutic drug level monitoring: Secondary | ICD-10-CM | POA: Diagnosis not present

## 2017-06-24 DIAGNOSIS — Z7901 Long term (current) use of anticoagulants: Secondary | ICD-10-CM

## 2017-06-24 DIAGNOSIS — I4891 Unspecified atrial fibrillation: Secondary | ICD-10-CM | POA: Diagnosis not present

## 2017-06-24 DIAGNOSIS — I4819 Other persistent atrial fibrillation: Secondary | ICD-10-CM

## 2017-06-24 LAB — LIPID PANEL
CHOL/HDL RATIO: 3.5 (calc) (ref <5.0)
Cholesterol: 128 mg/dL (ref <200)
HDL: 37 mg/dL — ABNORMAL LOW (ref >50)
LDL CHOLESTEROL (CALC): 69 mg/dL
NON-HDL CHOLESTEROL (CALC): 91 mg/dL (ref <130)
TRIGLYCERIDES: 136 mg/dL (ref <150)

## 2017-06-24 LAB — BASIC METABOLIC PANEL WITH GFR
BUN/Creatinine Ratio: 15 (calc) (ref 6–22)
BUN: 16 mg/dL (ref 7–25)
CALCIUM: 9.3 mg/dL (ref 8.6–10.4)
CO2: 29 mmol/L (ref 20–32)
Chloride: 107 mmol/L (ref 98–110)
Creat: 1.09 mg/dL — ABNORMAL HIGH (ref 0.60–0.88)
GFR, EST AFRICAN AMERICAN: 55 mL/min/{1.73_m2} — AB (ref >=60)
GFR, Est Non African American: 48 mL/min/{1.73_m2} — ABNORMAL LOW (ref >=60)
GLUCOSE: 89 mg/dL (ref 65–99)
Potassium: 5 mmol/L (ref 3.5–5.3)
Sodium: 144 mmol/L (ref 135–146)

## 2017-06-24 LAB — CBC WITH DIFFERENTIAL/PLATELET
BASOS PCT: 0.7 %
Basophils Absolute: 43 cells/uL (ref 0–200)
Eosinophils Absolute: 189 cells/uL (ref 15–500)
Eosinophils Relative: 3.1 %
HCT: 39.6 % (ref 35.0–45.0)
Hemoglobin: 13.5 g/dL (ref 11.7–15.5)
Lymphs Abs: 1885 cells/uL (ref 850–3900)
MCH: 32 pg (ref 27.0–33.0)
MCHC: 34.1 g/dL (ref 32.0–36.0)
MCV: 93.8 fL (ref 80.0–100.0)
MPV: 11 fL (ref 7.5–12.5)
Monocytes Relative: 6 %
NEUTROS ABS: 3617 {cells}/uL (ref 1500–7800)
Neutrophils Relative %: 59.3 %
PLATELETS: 196 10*3/uL (ref 140–400)
RBC: 4.22 10*6/uL (ref 3.80–5.10)
RDW: 12.5 % (ref 11.0–15.0)
Total Lymphocyte: 30.9 %
WBC: 6.1 10*3/uL (ref 3.8–10.8)
WBCMIX: 366 {cells}/uL (ref 200–950)

## 2017-06-24 LAB — HEMOGLOBIN A1C
HEMOGLOBIN A1C: 4.9 %{Hb} (ref <5.7)
Mean Plasma Glucose: 94 (calc)
eAG (mmol/L): 5.2 (calc)

## 2017-06-24 LAB — HEPATIC FUNCTION PANEL
AG RATIO: 1.7 (calc) (ref 1.0–2.5)
ALBUMIN MSPROF: 3.8 g/dL (ref 3.6–5.1)
ALT: 25 U/L (ref 6–29)
AST: 18 U/L (ref 10–35)
Alkaline phosphatase (APISO): 127 U/L (ref 33–130)
BILIRUBIN DIRECT: 0.1 mg/dL (ref 0.0–0.2)
BILIRUBIN INDIRECT: 0.4 mg/dL (ref 0.2–1.2)
BILIRUBIN TOTAL: 0.5 mg/dL (ref 0.2–1.2)
GLOBULIN: 2.3 g/dL (ref 1.9–3.7)
Total Protein: 6.1 g/dL (ref 6.1–8.1)

## 2017-06-24 LAB — POCT INR: INR: 1.8

## 2017-06-24 LAB — VITAMIN D 25 HYDROXY (VIT D DEFICIENCY, FRACTURES): VIT D 25 HYDROXY: 72 ng/mL (ref 30–100)

## 2017-06-24 LAB — INSULIN, RANDOM: INSULIN: 6.1 u[IU]/mL (ref 2.0–19.6)

## 2017-06-24 LAB — TSH: TSH: 1.23 m[IU]/L (ref 0.40–4.50)

## 2017-06-24 LAB — MAGNESIUM: MAGNESIUM: 1.8 mg/dL (ref 1.5–2.5)

## 2017-06-25 ENCOUNTER — Encounter: Payer: Self-pay | Admitting: Internal Medicine

## 2017-06-28 ENCOUNTER — Other Ambulatory Visit: Payer: Self-pay | Admitting: *Deleted

## 2017-06-28 MED ORDER — ONDANSETRON 4 MG PO TBDP: 4.0000 mg | ORAL_TABLET | Freq: Three times a day (TID) | ORAL | 0 refills | Status: DC | PRN

## 2017-07-08 ENCOUNTER — Other Ambulatory Visit: Payer: Self-pay | Admitting: *Deleted

## 2017-07-08 ENCOUNTER — Ambulatory Visit (INDEPENDENT_AMBULATORY_CARE_PROVIDER_SITE_OTHER): Payer: Medicare Other | Admitting: Pharmacist

## 2017-07-08 DIAGNOSIS — I4891 Unspecified atrial fibrillation: Secondary | ICD-10-CM

## 2017-07-08 DIAGNOSIS — Z7901 Long term (current) use of anticoagulants: Secondary | ICD-10-CM | POA: Diagnosis not present

## 2017-07-08 LAB — POCT INR: INR: 1.5

## 2017-07-08 MED ORDER — WARFARIN SODIUM 2 MG PO TABS
ORAL_TABLET | ORAL | 0 refills | Status: DC
Start: 2017-07-08 — End: 2017-07-21

## 2017-07-21 ENCOUNTER — Other Ambulatory Visit: Payer: Self-pay | Admitting: Internal Medicine

## 2017-07-22 ENCOUNTER — Other Ambulatory Visit: Payer: Self-pay

## 2017-07-22 ENCOUNTER — Telehealth: Payer: Self-pay | Admitting: Internal Medicine

## 2017-07-22 ENCOUNTER — Other Ambulatory Visit: Payer: Self-pay | Admitting: Internal Medicine

## 2017-07-22 ENCOUNTER — Ambulatory Visit (INDEPENDENT_AMBULATORY_CARE_PROVIDER_SITE_OTHER): Payer: Medicare Other | Admitting: Pharmacist

## 2017-07-22 DIAGNOSIS — I4891 Unspecified atrial fibrillation: Secondary | ICD-10-CM

## 2017-07-22 DIAGNOSIS — Z7901 Long term (current) use of anticoagulants: Secondary | ICD-10-CM | POA: Diagnosis not present

## 2017-07-22 LAB — POCT INR: INR: 2.2

## 2017-07-22 MED ORDER — ONDANSETRON 4 MG PO TBDP: 4.0000 mg | ORAL_TABLET | Freq: Three times a day (TID) | ORAL | 0 refills | Status: DC | PRN

## 2017-07-22 NOTE — Patient Instructions (Signed)
Continue taking 1 tablet daily except 1.5 tablets on Mondays, Wednesdays, and  Fridays.  Recheck INR 3 weeks. Call us with any medication changes or concerns # [phone].

## 2017-07-22 NOTE — Telephone Encounter (Signed)
Called to request requesting 90 day rx on zofran, under tounge. [facility] [place]

## 2017-08-12 ENCOUNTER — Ambulatory Visit (INDEPENDENT_AMBULATORY_CARE_PROVIDER_SITE_OTHER): Payer: Medicare Other | Admitting: *Deleted

## 2017-08-12 DIAGNOSIS — I4891 Unspecified atrial fibrillation: Secondary | ICD-10-CM

## 2017-08-12 DIAGNOSIS — Z7901 Long term (current) use of anticoagulants: Secondary | ICD-10-CM

## 2017-08-12 LAB — POCT INR: INR: 2.4

## 2017-08-12 NOTE — Patient Instructions (Addendum)
Continue taking 1 tablet daily except 1.5 tablets on Mondays, Wednesdays, and  Fridays.  Recheck INR 3-4 weeks. Call us with any medication changes or concerns # [phone].

## 2017-08-17 ENCOUNTER — Other Ambulatory Visit: Payer: Self-pay | Admitting: Internal Medicine

## 2017-08-18 ENCOUNTER — Other Ambulatory Visit: Payer: Self-pay | Admitting: Internal Medicine

## 2017-08-18 ENCOUNTER — Telehealth: Payer: Self-pay | Admitting: *Deleted

## 2017-08-18 NOTE — Telephone Encounter (Signed)
Daughter called and reported the patient BP is now 117/81 and the pulse has increased to 115.  Per Dr Melford Aase, give the patient Metoprolol 50 mg 1/2 tablet now and call him in about 3 hours to report the pulse rate.

## 2017-08-18 NOTE — Telephone Encounter (Signed)
Patient daughter called and reported the patient's BP was 77/60 and her pulse was 109.  Per Dr Melford Aase, hold all BP medications until systolic is above 627 and  check the BP in the AM and PM.  The daughter is aware and will force fluids.

## 2017-08-22 DIAGNOSIS — C169 Malignant neoplasm of stomach, unspecified: Secondary | ICD-10-CM | POA: Diagnosis not present

## 2017-08-22 DIAGNOSIS — R110 Nausea: Secondary | ICD-10-CM | POA: Diagnosis not present

## 2017-08-22 DIAGNOSIS — Z903 Acquired absence of stomach [part of]: Secondary | ICD-10-CM | POA: Diagnosis not present

## 2017-08-31 ENCOUNTER — Ambulatory Visit (INDEPENDENT_AMBULATORY_CARE_PROVIDER_SITE_OTHER): Payer: Medicare Other | Admitting: *Deleted

## 2017-08-31 DIAGNOSIS — I495 Sick sinus syndrome: Secondary | ICD-10-CM | POA: Diagnosis not present

## 2017-08-31 NOTE — Progress Notes (Signed)
Remote pacemaker transmission.   

## 2017-09-01 ENCOUNTER — Encounter: Payer: Self-pay | Admitting: Cardiology

## 2017-09-01 LAB — CUP PACEART REMOTE DEVICE CHECK
Battery Impedance: 1005 Ohm
Battery Remaining Longevity: 63 mo
Battery Voltage: 2.79 V
Brady Statistic AP VP Percent: 0 %
Implantable Lead Location: 753859
Implantable Lead Location: 753860
Implantable Lead Model: 4469
Implantable Lead Model: 4470
Implantable Lead Serial Number: 548229
Implantable Pulse Generator Implant Date: 20111027
Lead Channel Impedance Value: 464 Ohm
Lead Channel Pacing Threshold Amplitude: 0.375 V
Lead Channel Sensing Intrinsic Amplitude: 0.7 mV
Lead Channel Sensing Intrinsic Amplitude: 5.6 mV
Lead Channel Setting Pacing Amplitude: 2.75 V
Lead Channel Setting Pacing Pulse Width: 0.4 ms
MDC IDC LEAD IMPLANT DT: 20111027
MDC IDC LEAD IMPLANT DT: 20111027
MDC IDC LEAD SERIAL: 686026
MDC IDC MSMT LEADCHNL RA PACING THRESHOLD PULSEWIDTH: 0.4 ms
MDC IDC MSMT LEADCHNL RV IMPEDANCE VALUE: 373 Ohm
MDC IDC MSMT LEADCHNL RV PACING THRESHOLD AMPLITUDE: 1.375 V
MDC IDC MSMT LEADCHNL RV PACING THRESHOLD PULSEWIDTH: 0.4 ms
MDC IDC SESS DTM: 20181226133519
MDC IDC SET LEADCHNL RA PACING AMPLITUDE: 2 V
MDC IDC SET LEADCHNL RV SENSING SENSITIVITY: 2 mV
MDC IDC STAT BRADY AP VS PERCENT: 75 %
MDC IDC STAT BRADY AS VP PERCENT: 0 %
MDC IDC STAT BRADY AS VS PERCENT: 24 %

## 2017-09-02 ENCOUNTER — Ambulatory Visit (INDEPENDENT_AMBULATORY_CARE_PROVIDER_SITE_OTHER): Payer: Medicare Other | Admitting: *Deleted

## 2017-09-02 DIAGNOSIS — I4891 Unspecified atrial fibrillation: Secondary | ICD-10-CM | POA: Diagnosis not present

## 2017-09-02 DIAGNOSIS — Z7901 Long term (current) use of anticoagulants: Secondary | ICD-10-CM | POA: Diagnosis not present

## 2017-09-02 LAB — POCT INR: INR: 2.6

## 2017-09-02 NOTE — Patient Instructions (Signed)
Description   Continue taking 1 tablet daily except 1.5 tablets on Mondays, Wednesdays, and  Fridays.  Recheck INR 3-4 weeks. Call us with any medication changes or concerns # [phone].

## 2017-09-09 ENCOUNTER — Other Ambulatory Visit: Payer: Self-pay | Admitting: Internal Medicine

## 2017-09-11 ENCOUNTER — Other Ambulatory Visit: Payer: Self-pay | Admitting: Internal Medicine

## 2017-09-28 ENCOUNTER — Ambulatory Visit: Payer: Self-pay | Admitting: Adult Health

## 2017-09-30 ENCOUNTER — Ambulatory Visit (INDEPENDENT_AMBULATORY_CARE_PROVIDER_SITE_OTHER): Payer: Medicare Other

## 2017-09-30 DIAGNOSIS — Z7901 Long term (current) use of anticoagulants: Secondary | ICD-10-CM | POA: Diagnosis not present

## 2017-09-30 DIAGNOSIS — I4891 Unspecified atrial fibrillation: Secondary | ICD-10-CM | POA: Diagnosis not present

## 2017-09-30 LAB — POCT INR: INR: 2.9

## 2017-09-30 NOTE — Patient Instructions (Signed)
Description   Continue taking 1 tablet daily except 1.5 tablets on Mondays, Wednesdays,and Fridays.  Recheck INR 4 weeks. Call us with any medication changes or concerns # [phone].

## 2017-10-31 ENCOUNTER — Ambulatory Visit (INDEPENDENT_AMBULATORY_CARE_PROVIDER_SITE_OTHER): Payer: Medicare Other | Admitting: Pharmacist

## 2017-10-31 DIAGNOSIS — Z7901 Long term (current) use of anticoagulants: Secondary | ICD-10-CM | POA: Diagnosis not present

## 2017-10-31 DIAGNOSIS — I4891 Unspecified atrial fibrillation: Secondary | ICD-10-CM | POA: Diagnosis not present

## 2017-10-31 LAB — POCT INR: INR: 2.3

## 2017-10-31 NOTE — Patient Instructions (Signed)
Description   Continue taking 1 tablet daily except 1.5 tablets on Mondays, Wednesdays,and Fridays.  Recheck INR 6 weeks. Call us with any medication changes or concerns # [phone].

## 2017-11-16 ENCOUNTER — Other Ambulatory Visit: Payer: Self-pay

## 2017-11-16 MED ORDER — LEVOTHYROXINE SODIUM 100 MCG PO TABS: 50.0000 ug | ORAL_TABLET | Freq: Every day | ORAL | 0 refills | Status: DC

## 2017-11-20 ENCOUNTER — Other Ambulatory Visit: Payer: Self-pay | Admitting: Physician Assistant

## 2017-11-28 ENCOUNTER — Ambulatory Visit: Payer: Medicare Other | Admitting: Internal Medicine

## 2017-11-28 ENCOUNTER — Encounter: Payer: Self-pay | Admitting: Internal Medicine

## 2017-11-28 VITALS — BP 128/80 | HR 76 | Ht 59.0 in | Wt 99.2 lb

## 2017-11-28 DIAGNOSIS — Z950 Presence of cardiac pacemaker: Secondary | ICD-10-CM | POA: Diagnosis not present

## 2017-11-28 DIAGNOSIS — I495 Sick sinus syndrome: Secondary | ICD-10-CM | POA: Diagnosis not present

## 2017-11-28 DIAGNOSIS — I10 Essential (primary) hypertension: Secondary | ICD-10-CM

## 2017-11-28 DIAGNOSIS — I481 Persistent atrial fibrillation: Secondary | ICD-10-CM | POA: Diagnosis not present

## 2017-11-28 DIAGNOSIS — I4819 Other persistent atrial fibrillation: Secondary | ICD-10-CM

## 2017-11-28 NOTE — Patient Instructions (Addendum)
Medication Instructions:  Your physician recommends that you continue on your current medications as directed. Please refer to the Current Medication list given to you today.  Labwork: None ordered.  Testing/Procedures: None ordered.  Follow-Up: Your physician wants you to follow-up in: one year with Amber Seiler, NP.   You will receive a reminder letter in the mail two months in advance. If you don't receive a letter, please call our office to schedule the follow-up appointment.  Remote monitoring is used to monitor your Pacemaker from home. This monitoring reduces the number of office visits required to check your device to one time per year. It allows us to keep an eye on the functioning of your device to ensure it is working properly. You are scheduled for a device check from home on 11/30/2017. You may send your transmission at any time that day. If you have a wireless device, the transmission will be sent automatically. After your physician reviews your transmission, you will receive a postcard with your next transmission date.  Any Other Special Instructions Will Be Listed Below (If Applicable).  If you need a refill on your cardiac medications before your next appointment, please call your pharmacy.   

## 2017-11-28 NOTE — Progress Notes (Signed)
PCP: [name], MD Primary Cardiologist:  Dr [name] Primary EP:  Dr Rayann Heman  [name] is a 84 y.o. female who presents today for routine electrophysiology followup.  Since last being seen in our clinic, the patient reports doing very well.  Today, she denies symptoms of palpitations, chest pain, shortness of breath,  lower extremity edema, dizziness, presyncope, or syncope.  She is s/p prior gastric tumor resection.  The patient is otherwise without complaint today.   Past Medical History:  Diagnosis Date  . Adenocarcinoma of stomach ([place]) 11/11/2015   gastric mass on egd  . Anal fissure   . Anemia    hx 3/17 iron infusion, on aranesp and injected B12 since 11/2015.   [name] Anxiety   . Aortic root dilatation ([place])   . Arthritis   . 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 CAD (coronary artery disease)   . Chronic kidney disease    only has one kidney rt lft rem 03 ca     dr. Risa Grill  . Elevated LFTs    hepatic steatosis  . Gastroparesis   . GERD (gastroesophageal reflux disease)   . History of uterine cancer 1980s   treated with hysterectomy, external radiation and radiation seed implants.   [name] HTN (hypertension)   . Hyperlipemia   . Hypothyroidism   . Iron deficiency anemia due to chronic blood loss 11/24/2015  . Moderate aortic stenosis   . Neuropathy    feet/legs  . No blood products 02/27/2016  . Osteomyelitis ([place]) as child  . PAF (paroxysmal atrial fibrillation) ([place])   . PONV (postoperative nausea and vomiting)    long time ago  . Radiation proctitis   . Restless leg syndrome   . Stroke [facility]) 15   partial stroke left legally blind  . Tachycardia-bradycardia syndrome ([place])    s/p PPM by Dr Doreatha Lew (MDT) 07/03/10   Past Surgical History:  Procedure Laterality Date  . CARDIAC CATHETERIZATION  2008  . CHOLECYSTECTOMY  1970s  . COLONOSCOPY N/A 11/11/2015   Procedure: COLONOSCOPY;  Surgeon: [name], MD;  [facility];  Service: Endoscopy;  Laterality: N/A;   . ESOPHAGOGASTRODUODENOSCOPY N/A 11/11/2015   Procedure: ESOPHAGOGASTRODUODENOSCOPY (EGD);  Surgeon: [name], MD;  Location: [facility];  Service: Endoscopy;  Laterality: N/A;  . ESOPHAGOGASTRODUODENOSCOPY N/A 02/05/2016   Procedure: ESOPHAGOGASTRODUODENOSCOPY (EGD);  Surgeon: Ladene Artist, MD;  Location: [facility] ENDOSCOPY;  Service: Endoscopy;  Laterality: N/A;  . ESOPHAGOGASTRODUODENOSCOPY N/A 04/02/2016   Procedure: ESOPHAGOGASTRODUODENOSCOPY (EGD);  Surgeon: [name], MD;  Location: [facility];  Service: Endoscopy;  Laterality: N/A;  . ESOPHAGOGASTRODUODENOSCOPY (EGD) WITH PROPOFOL N/A 04/06/2017   Procedure: ESOPHAGOGASTRODUODENOSCOPY (EGD) WITH PROPOFOL;  Surgeon: [name], MD;  [facility];  Service: Endoscopy;  Laterality: N/A;  . GASTRECTOMY N/A 01/15/2016   Procedure: OPEN PARTIAL GASTRECTOMY;  Surgeon: Stark Klein, MD;  Location: [place];  Service: General;  Laterality: N/A;  . GASTRECTOMY Left 02/24/2016   Procedure: OPEN J TUBE;  Surgeon: Stark Klein, MD;  Location: [place];  Service: General;  Laterality: Left;  [name] GASTROJEJUNOSTOMY N/A 05/18/2016   Procedure: ROUX EN Y GASTROJEJUNOSTOMY;  Surgeon: Stark Klein, MD;  Location: [place];  Service: General;  Laterality: N/A;  . I&D KNEE WITH POLY EXCHANGE Right 12/17/2013   Procedure: IRRIGATION AND DEBRIDEMEN RIGHT TOTAL  KNEE WITH POLY EXCHANGE;  Surgeon: Mauri Pole, MD;  [facility];  Service: Orthopedics;  Laterality: Right;  . IR GENERIC  HISTORICAL  06/17/2016   IR RADIOLOGIST EVAL & MGMT 06/17/2016 Ascencion Dike, PA-C [facility] INTERV RAD  . LAPAROSCOPY N/A 01/15/2016   Procedure: LAPAROSCOPY DIAGNOSTIC;  Surgeon: Stark Klein, MD;  Location: [place];  Service: General;  Laterality: N/A;  . LYSIS OF ADHESION  05/18/2016   Procedure: LYSIS OF ADHESION;  Surgeon: Stark Klein, MD;  Location: [place];  Service: General;;  . NEPHRECTOMY Left    renal cell cancer  . PACEMAKER INSERTION  07/04/11   MDT PPM  implanted by Dr Doreatha Lew for tachy/brady  . PATELLAR TENDON REPAIR Right 03/06/2014   Procedure: AVULSION WITH PATELLA TENDON REPAIR;  Surgeon: Mauri Pole, MD;  [facility];  Service: Orthopedics;  Laterality: Right;  . PATENT DUCTUS ARTERIOUS REPAIR  ~ 1949  . TOTAL ABDOMINAL HYSTERECTOMY  85  . TOTAL HIP REVISION Right 2009   initial replacment surg  . TOTAL KNEE ARTHROPLASTY  02/07/2012   Procedure: TOTAL KNEE ARTHROPLASTY;  Surgeon: Mauri Pole, MD;  [facility];  Service: Orthopedics;  Laterality: Right;  . TOTAL KNEE REVISION Right 07/29/2014   Procedure: RIGHT KNEE REVISION OF PREVIOUS REPAIR EXTENSOR MECHANISM;  Surgeon: Mauri Pole, MD;  [facility];  Service: Orthopedics;  Laterality: Right;  . WHIPPLE PROCEDURE N/A 05/18/2016   Procedure: EXPLORATORY LAPAROTOMY;  Surgeon: Stark Klein, MD;  Location: [place];  Service: General;  Laterality: N/A;    ROS- all systems are reviewed and negative except as per HPI above  Current Outpatient Medications  Medication Sig Dispense Refill  . acetaminophen (TYLENOL) 500 MG tablet Take 500 mg by mouth every 6 (six) hours as needed for mild pain.    [name] ALPRAZolam (XANAX) 0.5 MG tablet TAKE 1/2-1 TABLET BY MOUTH 3 TIMES A DAY AS NEEDED FOR ANXIETY 90 tablet 0  . Biotin 10000 MCG TABS Take 1 tablet by mouth daily.    . Cholecalciferol (VITAMIN D PO) Take 5,000 Units by mouth daily.    [name] diltiazem (CARDIZEM CD) 180 MG 24 hr capsule TAKE 1 CAPSULE (180 MG TOTAL) BY MOUTH DAILY. 90 capsule 1  . furosemide (LASIX) 40 MG tablet TAKE 1/2 TABLET EVERY DAY 90 tablet 0  . gabapentin (NEURONTIN) 300 MG capsule TAKE 1 CAPSULE (300 MG TOTAL) BY MOUTH 3 (THREE) TIMES DAILY. 270 capsule 1  . gabapentin (NEURONTIN) 300 MG capsule Take 300 mg by mouth 2 (two) times daily.    [name] KLOR-CON 10 10 MEQ tablet TAKE 1 TABLET (10 MEQ TOTAL) BY MOUTH 2 (TWO) TIMES DAILY. 180 tablet 1  . levothyroxine (SYNTHROID, LEVOTHROID) 100 MCG tablet TAKE 1 TABLET BY  MOUTH EVERY DAY ON EMPTY STOMACH WITH A FULL GLASS OF WATER 90 tablet 1  . levothyroxine (SYNTHROID, LEVOTHROID) 100 MCG tablet Take 50 mcg by mouth daily before breakfast.    . metoprolol tartrate (LOPRESSOR) 100 MG tablet TAKE 1 TABLET BY MOUTH 2 TIMES A DAY 180 tablet 1  . nitroGLYCERIN (NITROSTAT) 0.4 MG SL tablet Place 1 tablet (0.4 mg total) under the tongue every 5 (five) minutes as needed for chest pain (MAX 3 TABLETS). 25 tablet prn  . ondansetron (ZOFRAN-ODT) 4 MG disintegrating tablet Take 8 mg by mouth every 8 (eight) hours as needed for nausea or vomiting.    . ranitidine (ZANTAC) 150 MG tablet Take 150 mg by mouth 2 (two) times daily.    . simethicone (MYLICON) 80 MG chewable tablet Chew 80 mg by mouth every 6 (six) hours as needed for  flatulence.    . sucralfate (CARAFATE) 1 g tablet TAKE 1 TABLET BEFORE MEALS AND TAKE 1 TABLET AT BEDTIME 120 tablet 1  . warfarin (COUMADIN) 2 MG tablet TAKE 1 TABLET DAILY OR AS DIRECTED 100 tablet 0   No current facility-administered medications for this visit.     Physical Exam: Vitals:   11/28/17 1228  BP: 128/80  Pulse: 76  SpO2: 97%  Weight: 99 lb 3.2 oz (45 kg)  Height: 4\' 11"  (1.499 m)    GEN- The patient is well appearing, alert and oriented x 3 today.   Head- normocephalic, atraumatic Eyes-  Sclera clear, conjunctiva pink Ears- hearing intact Oropharynx- clear Lungs- Clear to ausculation bilaterally, normal work of breathing Chest- pacemaker pocket is well healed Heart- Regular rate and rhythm, no murmurs, rubs or gallops, PMI not laterally displaced GI- soft, NT, ND, + BS Extremities- no clubbing, cyanosis, or edema  Pacemaker interrogation- reviewed in detail today,  See PACEART report  ekg tracing ordered today is personally reviewed and shows atrial paced rhythm, nonspecific ST/T changes  Assessment and Plan:  1. Symptomatic sinus bradycardia  Normal pacemaker function See Pace Art report No changes today  2.  afib Stable afib burden 7% (23% last year) On coumadin for chads2vasc score of 7  3. HTN Stable No change required today  4. Mitral stenosis (moderate) with pulmonary hypertension Per Dr [name]  Return in 1 year to see EP NP   Follow-up with Dr [name] as scheduled carelink every 3 months   Thompson Grayer MD, [facility] 11/28/2017

## 2017-11-29 DIAGNOSIS — H903 Sensorineural hearing loss, bilateral: Secondary | ICD-10-CM | POA: Diagnosis not present

## 2017-11-30 ENCOUNTER — Ambulatory Visit (INDEPENDENT_AMBULATORY_CARE_PROVIDER_SITE_OTHER): Payer: Medicare Other | Admitting: *Deleted

## 2017-11-30 ENCOUNTER — Telehealth: Payer: Self-pay | Admitting: Cardiology

## 2017-11-30 DIAGNOSIS — I495 Sick sinus syndrome: Secondary | ICD-10-CM | POA: Diagnosis not present

## 2017-11-30 NOTE — Telephone Encounter (Signed)
LMOVM reminding pt to send remote transmission.   

## 2017-12-02 NOTE — Progress Notes (Signed)
Remote pacemaker transmission.   

## 2017-12-05 ENCOUNTER — Encounter: Payer: Self-pay | Admitting: Cardiology

## 2017-12-09 ENCOUNTER — Ambulatory Visit (INDEPENDENT_AMBULATORY_CARE_PROVIDER_SITE_OTHER): Payer: Medicare Other | Admitting: Pharmacist

## 2017-12-09 DIAGNOSIS — I4891 Unspecified atrial fibrillation: Secondary | ICD-10-CM

## 2017-12-09 DIAGNOSIS — Z7901 Long term (current) use of anticoagulants: Secondary | ICD-10-CM | POA: Diagnosis not present

## 2017-12-09 LAB — CUP PACEART REMOTE DEVICE CHECK
Brady Statistic AP VS Percent: 89 %
Brady Statistic AS VP Percent: 0 %
Brady Statistic AS VS Percent: 11 %
Date Time Interrogation Session: 20190327200950
Implantable Lead Implant Date: 20111027
Implantable Lead Location: 753860
Implantable Lead Model: 4470
Lead Channel Impedance Value: 405 Ohm
Lead Channel Pacing Threshold Amplitude: 0.375 V
Lead Channel Pacing Threshold Amplitude: 1.25 V
Lead Channel Pacing Threshold Pulse Width: 0.4 ms
Lead Channel Pacing Threshold Pulse Width: 0.4 ms
MDC IDC LEAD IMPLANT DT: 20111027
MDC IDC LEAD LOCATION: 753859
MDC IDC LEAD SERIAL: 548229
MDC IDC LEAD SERIAL: 686026
MDC IDC MSMT BATTERY IMPEDANCE: 1139 Ohm
MDC IDC MSMT BATTERY REMAINING LONGEVITY: 53 mo
MDC IDC MSMT BATTERY VOLTAGE: 2.79 V
MDC IDC MSMT LEADCHNL RA IMPEDANCE VALUE: 484 Ohm
MDC IDC PG IMPLANT DT: 20111027
MDC IDC SET LEADCHNL RA PACING AMPLITUDE: 2 V
MDC IDC SET LEADCHNL RV PACING AMPLITUDE: 2.5 V
MDC IDC SET LEADCHNL RV PACING PULSEWIDTH: 0.4 ms
MDC IDC SET LEADCHNL RV SENSING SENSITIVITY: 2 mV
MDC IDC STAT BRADY AP VP PERCENT: 0 %

## 2017-12-09 LAB — POCT INR: INR: 2.3

## 2017-12-09 NOTE — Patient Instructions (Signed)
Description   Continue taking 1 tablet daily except 1.5 tablets on Mondays, Wednesdays,and Fridays.  Recheck INR 6 weeks. Call us with any medication changes or concerns # [phone].

## 2017-12-12 ENCOUNTER — Other Ambulatory Visit: Payer: Self-pay | Admitting: Internal Medicine

## 2017-12-12 ENCOUNTER — Other Ambulatory Visit: Payer: Self-pay | Admitting: Physician Assistant

## 2017-12-19 ENCOUNTER — Other Ambulatory Visit: Payer: Self-pay | Admitting: Internal Medicine

## 2017-12-23 ENCOUNTER — Encounter: Payer: Self-pay | Admitting: Internal Medicine

## 2017-12-26 ENCOUNTER — Other Ambulatory Visit: Payer: Self-pay | Admitting: Internal Medicine

## 2017-12-30 ENCOUNTER — Other Ambulatory Visit: Payer: Self-pay | Admitting: *Deleted

## 2017-12-30 MED ORDER — POTASSIUM CHLORIDE ER 10 MEQ PO TBCR: 10.0000 meq | EXTENDED_RELEASE_TABLET | Freq: Two times a day (BID) | ORAL | 1 refills | Status: DC

## 2018-01-02 ENCOUNTER — Ambulatory Visit (INDEPENDENT_AMBULATORY_CARE_PROVIDER_SITE_OTHER): Payer: Medicare Other | Admitting: Internal Medicine

## 2018-01-02 ENCOUNTER — Encounter: Payer: Self-pay | Admitting: Internal Medicine

## 2018-01-02 VITALS — BP 166/80 | HR 76 | Temp 97.7°F | Resp 16 | Ht 59.0 in | Wt 101.8 lb

## 2018-01-02 DIAGNOSIS — Z950 Presence of cardiac pacemaker: Secondary | ICD-10-CM

## 2018-01-02 DIAGNOSIS — E039 Hypothyroidism, unspecified: Secondary | ICD-10-CM

## 2018-01-02 DIAGNOSIS — Z0001 Encounter for general adult medical examination with abnormal findings: Secondary | ICD-10-CM

## 2018-01-02 DIAGNOSIS — N183 Chronic kidney disease, stage 3 unspecified: Secondary | ICD-10-CM

## 2018-01-02 DIAGNOSIS — K219 Gastro-esophageal reflux disease without esophagitis: Secondary | ICD-10-CM

## 2018-01-02 DIAGNOSIS — R7303 Prediabetes: Secondary | ICD-10-CM

## 2018-01-02 DIAGNOSIS — R7309 Other abnormal glucose: Secondary | ICD-10-CM | POA: Diagnosis not present

## 2018-01-02 DIAGNOSIS — I482 Chronic atrial fibrillation, unspecified: Secondary | ICD-10-CM

## 2018-01-02 DIAGNOSIS — Z79899 Other long term (current) drug therapy: Secondary | ICD-10-CM

## 2018-01-02 DIAGNOSIS — Z0000 Encounter for general adult medical examination without abnormal findings: Secondary | ICD-10-CM

## 2018-01-02 DIAGNOSIS — E782 Mixed hyperlipidemia: Secondary | ICD-10-CM

## 2018-01-02 DIAGNOSIS — E559 Vitamin D deficiency, unspecified: Secondary | ICD-10-CM | POA: Diagnosis not present

## 2018-01-02 DIAGNOSIS — I350 Nonrheumatic aortic (valve) stenosis: Secondary | ICD-10-CM

## 2018-01-02 DIAGNOSIS — Z1211 Encounter for screening for malignant neoplasm of colon: Secondary | ICD-10-CM

## 2018-01-02 DIAGNOSIS — I10 Essential (primary) hypertension: Secondary | ICD-10-CM

## 2018-01-02 DIAGNOSIS — Z1212 Encounter for screening for malignant neoplasm of rectum: Secondary | ICD-10-CM

## 2018-01-02 LAB — CBC WITH DIFFERENTIAL/PLATELET
Basophils Absolute: 32 cells/uL (ref 0–200)
Basophils Relative: 0.6 %
EOS ABS: 180 {cells}/uL (ref 15–500)
EOS PCT: 3.4 %
HEMATOCRIT: 37.6 % (ref 35.0–45.0)
HEMOGLOBIN: 13 g/dL (ref 11.7–15.5)
LYMPHS ABS: 1442 {cells}/uL (ref 850–3900)
MCH: 32.6 pg (ref 27.0–33.0)
MCHC: 34.6 g/dL (ref 32.0–36.0)
MCV: 94.2 fL (ref 80.0–100.0)
MONOS PCT: 10 %
MPV: 10.8 fL (ref 7.5–12.5)
NEUTROS ABS: 3116 {cells}/uL (ref 1500–7800)
NEUTROS PCT: 58.8 %
Platelets: 187 10*3/uL (ref 140–400)
RBC: 3.99 10*6/uL (ref 3.80–5.10)
RDW: 12 % (ref 11.0–15.0)
Total Lymphocyte: 27.2 %
WBC mixed population: 530 cells/uL (ref 200–950)
WBC: 5.3 10*3/uL (ref 3.8–10.8)

## 2018-01-02 NOTE — Progress Notes (Signed)
[facility] [name], M.D.     Uvaldo Bristle. Silverio Lay, P.A.-C [name], [place] [address] [place] [address] Telephone [phone] Telefax [phone] Annual Screening/Preventative Visit & Comprehensive Evaluation &  Examination     This very nice 84 y.o. [name] presents for a Screening/Preventative Visit & comprehensive evaluation and management of multiple medical co-morbidities.  Patient has been followed for HTN, ASHD/cAfib/AoS,  HLD, T2_NIDDM  Prediabetes  and Vitamin D Deficiency.         Patient has pertinent hx/o Ca of Uterus (1994), Lt Renal transitional cell Ca (2003) and most recently resection of a Gastric Ca in 2017 by Roux-en-Y.  Since her last surgery she still reports occasional Heartburn and early satiety and Nausea.       HTN predates circa 1960's.  Patient has been on Coumadin since 2008 for cAfib and also had a PPM implanted in 2011 for SSS. Patient's BP has been controlled at home and patient denies any cardiac symptoms as chest pain, palpitations, shortness of breath, dizziness or ankle swelling. Patient also has CKD3 consequent of her long-standing HTN. Today's BP was elevated & rechecked at 166/80.     Patient's hyperlipidemia is controlled with diet and medications. Patient denies myalgias or other medication SE's. Last lipids were at goal: Lab Results  Component Value Date   CHOL 128 06/23/2017   HDL 37 (L) 06/23/2017   LDLCALC 69 06/23/2017   TRIG 136 06/23/2017   CHOLHDL 3.5 06/23/2017      Patient has prediabetes/Insulin Resistance predating since 2012 and patient denies reactive hypoglycemic symptoms, visual blurring, diabetic polys, or paresthesias. Last A1c was at goal: Lab Results  Component Value Date   HGBA1C 4.9 06/23/2017      Patient has been on Thyroid replacement circa 1990's. Finally, patient has history of Vitamin D Deficiency and last Vitamin D was  at goal: Lab Results  Component Value Date   VD25OH 72 06/23/2017   Current Outpatient Medications on File Prior to Visit  Medication Sig  . acetaminophen (TYLENOL) 500 MG tablet Take 500 mg by mouth every 6 (six) hours as needed for mild pain.  [name] ALPRAZolam (XANAX) 0.5 MG tablet TAKE 1/2-1 TABLET BY MOUTH 3 TIMES A DAY AS NEEDED FOR ANXIETY  . Biotin 10000 MCG TABS Take 1 tablet by mouth daily.  . Cholecalciferol (VITAMIN D PO) Take 5,000 Units by mouth daily.  [name] diltiazem (CARDIZEM CD) 180 MG 24 hr capsule TAKE 1 CAPSULE (180 MG TOTAL) BY MOUTH DAILY.  . furosemide (LASIX) 20 MG tablet TAKE 1 TABLET BY MOUTH EVERY DAY  . gabapentin (NEURONTIN) 300 MG capsule Take 300 mg by mouth 2 (two) times daily.  [name] levothyroxine (SYNTHROID, LEVOTHROID) 100 MCG tablet Take 50 mcg by mouth daily before breakfast.  . metoprolol tartrate (LOPRESSOR) 100 MG tablet TAKE 1 TABLET BY MOUTH 2 TIMES A DAY  . nitroGLYCERIN (NITROSTAT) 0.4 MG SL tablet Place 1 tablet (0.4 mg total) under the tongue every 5 (five) minutes as needed for chest pain (MAX 3 TABLETS).  . ondansetron (ZOFRAN-ODT) 4 MG disintegrating tablet Take 8 mg by mouth every 8 (eight) hours as needed for nausea or vomiting.  . potassium chloride (KLOR-CON 10) 10 MEQ tablet Take 1 tablet (10 mEq total) by mouth 2 (two) times daily.  . ranitidine (ZANTAC) 150 MG tablet Take 150 mg by mouth 2 (two) times daily.  . simethicone (MYLICON) 80 MG chewable tablet  Chew 80 mg by mouth every 6 (six) hours as needed for flatulence.  . sucralfate (CARAFATE) 1 g tablet TAKE 1 TABLET (1 G TOTAL) BY MOUTH 4 (FOUR) TIMES DAILY - WITH MEALS AND AT BEDTIME.  [name] warfarin (COUMADIN) 2 MG tablet TAKE 1 TABLET DAILY OR AS DIRECTED   No current facility-administered medications on file prior to visit.    Allergies  Allergen Reactions  . Adhesive [Tape] Other (See Comments)    Tears skin, Please use "paper" tape  . Amiodarone Other (See Comments)     Thyroid and liver  and kidney problems  . Codeine Nausea And Vomiting  . Hydrocodone Other (See Comments)    hallucination  . Morphine And Related Nausea And Vomiting  . Remeron [Mirtazapine] Other (See Comments)    Hallucinations and make pt loopy  . Requip [Ropinirole Hcl] Other (See Comments)    Headache   . Zinc Nausea Only    nausea  . Reglan [Metoclopramide] Hives, Itching and Rash    Made face red, also   Past Medical History:  Diagnosis Date  . Adenocarcinoma of stomach ([place]) 11/11/2015   gastric mass on egd  . Anal fissure   . Anemia    hx 3/17 iron infusion, on aranesp and injected B12 since 11/2015.   [name] Anxiety   . Aortic root dilatation ([place])   . Arthritis   . 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 CAD (coronary artery disease)   . Chronic kidney disease    only has one kidney rt lft rem 03 ca     dr. Risa Grill  . Elevated LFTs    hepatic steatosis  . Gastroparesis   . GERD (gastroesophageal reflux disease)   . History of uterine cancer 1980s   treated with hysterectomy, external radiation and radiation seed implants.   [name] HTN (hypertension)   . Hyperlipemia   . Hypothyroidism   . Iron deficiency anemia due to chronic blood loss 11/24/2015  . Moderate aortic stenosis   . Neuropathy    feet/legs  . No blood products 02/27/2016  . Osteomyelitis ([place]) as child  . PAF (paroxysmal atrial fibrillation) ([place])   . PONV (postoperative nausea and vomiting)    long time ago  . Radiation proctitis   . Restless leg syndrome   . Stroke [name]) 15   partial stroke left legally blind  . Tachycardia-bradycardia syndrome [name])    s/p PPM by Dr Doreatha Lew (MDT) 07/03/10   Health Maintenance  Topic Date Due  . DEXA SCAN  02/11/2001  . INFLUENZA VACCINE  04/06/2018  . COLONOSCOPY  11/10/2020  . TETANUS/TDAP  08/11/2026  . PNA vac Low Risk Adult  Completed   Immunization History  Administered Date(s) Administered  . Influenza, High Dose Seasonal PF 07/18/2015, 06/23/2017  . Influenza,inj,Quad PF,6+ Mos  05/31/2014, 06/09/2016  . Influenza-Unspecified 09/16/2009, 07/03/2013  . Pneumococcal Conjugate-13 09/06/2002  . Pneumococcal Polysaccharide-23 07/18/2015  . Td 09/06/2002  . Zoster 09/06/2008   Last Colon - 11/11/2015 - Dr Carlean Purl & recc no f/u due to age.  Last MGM - 06/13/2013 and declines f/u.  Past Surgical History:  Procedure Laterality Date  . CARDIAC CATHETERIZATION  2008  . CHOLECYSTECTOMY  1970s  . COLONOSCOPY N/A 11/11/2015   Procedure: COLONOSCOPY;  Surgeon: [name], MD;  [facility];  Service: Endoscopy;  Laterality: N/A;  . ESOPHAGOGASTRODUODENOSCOPY N/A 11/11/2015   Procedure: ESOPHAGOGASTRODUODENOSCOPY (EGD);  Surgeon: [name], MD;  [facility];  Service: Endoscopy;  Laterality: N/A;  . ESOPHAGOGASTRODUODENOSCOPY N/A 02/05/2016   Procedure: ESOPHAGOGASTRODUODENOSCOPY (EGD);  Surgeon: Ladene Artist, MD;  [name];  Service: Endoscopy;  Laterality: N/A;  . ESOPHAGOGASTRODUODENOSCOPY N/A 04/02/2016   Procedure: ESOPHAGOGASTRODUODENOSCOPY (EGD);  Surgeon: [name], MD;  Location[facility];  Service: Endoscopy;  Laterality: N/A;  . ESOPHAGOGASTRODUODENOSCOPY (EGD) WITH PROPOFOL N/A 04/06/2017   Procedure: ESOPHAGOGASTRODUODENOSCOPY (EGD) WITH PROPOFOL;  Surgeon: [name], MD;  [facility];  Service: Endoscopy;  Laterality: N/A;  . GASTRECTOMY N/A 01/15/2016   Procedure: OPEN PARTIAL GASTRECTOMY;  Surgeon: Stark Klein, MD;  Location: [place];  Service: General;  Laterality: N/A;  . GASTRECTOMY Left 02/24/2016   Procedure: OPEN J TUBE;  Surgeon: Stark Klein, MD;  Location: [place];  Service: General;  Laterality: Left;  [name] GASTROJEJUNOSTOMY N/A 05/18/2016   Procedure: ROUX EN Y GASTROJEJUNOSTOMY;  Surgeon: Stark Klein, MD;  Location: [place];  Service: General;  Laterality: N/A;  . I&D KNEE WITH POLY EXCHANGE Right 12/17/2013   Procedure: IRRIGATION AND DEBRIDEMEN RIGHT TOTAL  KNEE WITH POLY EXCHANGE;  Surgeon:  Mauri Pole, MD;  [facility];  Service: Orthopedics;  Laterality: Right;  . IR GENERIC HISTORICAL  06/17/2016   IR RADIOLOGIST EVAL & MGMT 06/17/2016 Ascencion Dike, PA-C [facility] INTERV RAD  . LAPAROSCOPY N/A 01/15/2016   Procedure: LAPAROSCOPY DIAGNOSTIC;  Surgeon: Stark Klein, MD;  Location: [place];  Service: General;  Laterality: N/A;  . LYSIS OF ADHESION  05/18/2016   Procedure: LYSIS OF ADHESION;  Surgeon: Stark Klein, MD;  Location: [place];  Service: General;;  . NEPHRECTOMY Left    renal cell cancer  . PACEMAKER INSERTION  07/04/11   MDT PPM implanted by Dr Doreatha Lew for tachy/brady  . PATELLAR TENDON REPAIR Right 03/06/2014   Procedure: AVULSION WITH PATELLA TENDON REPAIR;  Surgeon: Mauri Pole, MD;  [facility];  Service: Orthopedics;  Laterality: Right;  . PATENT DUCTUS ARTERIOUS REPAIR  ~ 1949  . TOTAL ABDOMINAL HYSTERECTOMY  85  . TOTAL HIP REVISION Right 2009   initial replacment surg  . TOTAL KNEE ARTHROPLASTY  02/07/2012   Procedure: TOTAL KNEE ARTHROPLASTY;  Surgeon: Mauri Pole, MD;  [facility];  Service: Orthopedics;  Laterality: Right;  . TOTAL KNEE REVISION Right 07/29/2014   Procedure: RIGHT KNEE REVISION OF PREVIOUS REPAIR EXTENSOR MECHANISM;  Surgeon: Mauri Pole, MD;  [facility];  Service: Orthopedics;  Laterality: Right;  . WHIPPLE PROCEDURE N/A 05/18/2016   Procedure: EXPLORATORY LAPAROTOMY;  Surgeon: Stark Klein, MD;  [facility];  Service: General;  Laterality: N/A;   Family History  Problem Relation Age of Onset  . Colon cancer Mother 58  . Heart disease Mother   . Heart disease Father   . Heart attack Father   . Heart disease Maternal Grandmother    Social History   Tobacco Use  . Smoking status: Never Smoker  . Smokeless tobacco: Never Used  Substance Use Topics  . Alcohol use: No  . Drug use: No    ROS Constitutional: Denies fever, chills, weight loss/gain, headaches, insomnia,  night sweats, and change in  appetite. Does c/o fatigue. Eyes: Denies redness, blurred vision, diplopia, discharge, itchy, watery eyes.  ENT: Denies discharge, congestion, post nasal drip, epistaxis, sore throat, earache, hearing loss, dental pain, Tinnitus, Vertigo, Sinus pain, snoring.  Cardio: Denies chest pain, palpitations, irregular heartbeat, syncope, dyspnea, diaphoresis, orthopnea, PND, claudication, edema Respiratory: denies cough, dyspnea, DOE, pleurisy, hoarseness, laryngitis,  wheezing.  Gastrointestinal: Denies dysphagia, heartburn, reflux, water brash, pain, cramps, nausea, vomiting, bloating, diarrhea, constipation, hematemesis, melena, hematochezia, jaundice, hemorrhoids Genitourinary: Denies dysuria, frequency, urgency, nocturia, hesitancy, discharge, hematuria, flank pain Breast: Breast lumps, nipple discharge, bleeding.  Musculoskeletal: Denies arthralgia, myalgia, stiffness, Jt. Swelling, pain, limp, and strain/sprain. Denies falls. Skin: Denies puritis, rash, hives, warts, acne, eczema, changing in skin lesion Neuro: No weakness, tremor, incoordination, spasms, paresthesia, pain Psychiatric: Denies confusion, memory loss, sensory loss. Denies Depression. Endocrine: Denies change in weight, skin, hair change, nocturia, and paresthesia, diabetic polys, visual blurring, hyper / hypo glycemic episodes.  Heme/Lymph: No excessive bleeding, bruising, enlarged lymph nodes.  Physical Exam  BP (!) 166/80   Pulse 76   Temp 97.7 F (36.5 C)   Resp 16   Ht 4\' 11"  (1.499 m)   Wt 101 lb 12.8 oz (46.2 kg)   BMI 20.56 kg/m   General Appearance: Well nourished, well groomed and in no apparent distress.  Eyes: PERRLA, EOMs, conjunctiva no swelling or erythema, normal fundi and vessels. Sinuses: No frontal/maxillary tenderness ENT/Mouth: EACs patent / TMs  nl. Nares clear without erythema, swelling, mucoid exudates. Oral hygiene is good. No erythema, swelling, or exudate. Tongue normal, non-obstructing. Tonsils  not swollen or erythematous. Hearing normal.  Neck: Supple, thyroid not palpable. No bruits, nodes or JVD. Respiratory: Respiratory effort normal.  BS equal and clear bilateral without rales, rhonci, wheezing or stridor. Cardio: Heart sounds are soft with sl irregular rate and rhythm and  Gr 2-3/4 Ao sys murmur.  Peripheral pulses are normal and equal bilaterally without edema. No aortic or femoral bruits. Chest: symmetric with normal excursions and percussion. Breasts: Symmetric,  Atrophic without lumps, nipple discharge, retractions, or fibrocystic changes.  Abdomen: Flat, soft with bowel sounds active. Nontender, no guarding, rebound, hernias, masses, or organomegaly.  Lymphatics: Non tender without lymphadenopathy.  Musculoskeletal: Full ROM all peripheral extremities, joint stability, 5/5 strength, and normal gait. Skin: Warm and dry without rashes, lesions, cyanosis, clubbing or  ecchymosis.  Neuro: Cranial nerves intact, reflexes equal bilaterally. Normal muscle tone, no cerebellar symptoms. Sensation intact.  Pysch: Alert and oriented X 3, normal affect, Insight and Judgment appropriate.   Assessment and Plan  1. Annual Preventative Screening Examination  2. Essential hypertension  - Urinalysis, Routine w reflex microscopic - Microalbumin / creatinine urine ratio - CBC with Differential/Platelet - COMPLETE METABOLIC PANEL WITH GFR - Magnesium - TSH  3. Hyperlipidemia, mixed  - COMPLETE METABOLIC PANEL WITH GFR - Lipid panel - TSH  4. Abnormal glucose  - Hemoglobin A1c - Insulin, random  5. Vitamin D deficiency  - VITAMIN D 25 Hydroxyl  6. Prediabetes  - Hemoglobin A1c - Insulin, random  7. Hypothyroidism  - TSH  8. CKD (chronic kidney disease), stage III (HCC)  - Urinalysis, Routine w reflex microscopic - Microalbumin / creatinine urine ratio  9. Aortic stenosis, moderate   10. Chronic atrial fibrillation (HCC)  - TSH  11. Gastroesophageal reflux  disease   12. Cardiac pacemaker (06/2010)  - CBC with Differential/Platelet  13. Screening for colorectal cancer   14. Medication management  - Urinalysis, Routine w reflex microscopic - Microalbumin / creatinine urine ratio - CBC with Differential/Platelet - COMPLETE METABOLIC PANEL WITH GFR - Magnesium - Lipid panel - TSH - Hemoglobin A1c - Insulin, random - VITAMIN D 25 Hydroxyl       Patient was counseled in prudent diet to achieve/maintain BMI less than 25 for weight control, BP monitoring, regular exercise  and medications. Discussed med's effects and SE's. Screening labs and tests as requested with regular follow-up as recommended. Over 40 minutes of exam, counseling, chart review and high complex critical decision making was performed.

## 2018-01-02 NOTE — Patient Instructions (Signed)
We Do NOT Approve of  [facility] [place] Soliciting Our Patients  To Do Home Visits & We Do NOT Approve of [employer] > > > > > > > > > > > > > > > > > > > > > > > > > > > > > > > > > > > > > > >  Preventive Care for Adults  A healthy lifestyle and preventive care can promote health and wellness. Preventive health guidelines for women include the following key practices.  A routine yearly physical is a good way to check with your health care provider about your health and preventive screening. It is a chance to share any concerns and updates on your health and to receive a thorough exam.  Visit your dentist for a routine exam and preventive care every 6 months. Brush your teeth twice a day and floss once a day. Good oral hygiene prevents tooth decay and gum disease.  The frequency of eye exams is based on your age, health, family medical history, use of contact lenses, and other factors. Follow your health care provider's recommendations for frequency of eye exams.  Eat a healthy diet. Foods like vegetables, fruits, whole grains, low-fat dairy products, and lean protein foods contain the nutrients you need without too many calories. Decrease your intake of foods high in solid fats, added sugars, and salt. Eat the right amount of calories for you. Get information about a proper diet from your health care provider, if necessary.  Regular physical exercise is one of the most important things you can do for your health. Most adults should get at least 150 minutes of moderate-intensity exercise (any activity that increases your heart rate and causes you to sweat) each week. In addition, most adults need muscle-strengthening exercises on 2 or more days a week.  Maintain a healthy weight. The body mass index (BMI) is a screening tool to identify possible weight problems. It provides an estimate of body fat based on height and weight. Your health care provider can find your BMI  and can help you achieve or maintain a healthy weight. For adults 20 years and older:  A BMI below 18.5 is considered underweight.  A BMI of 18.5 to 24.9 is normal.  A BMI of 25 to 29.9 is considered overweight.  A BMI of 30 and above is considered obese.  Maintain normal blood lipids and cholesterol levels by exercising and minimizing your intake of saturated fat. Eat a balanced diet with plenty of fruit and vegetables. If your lipid or cholesterol levels are high, you are over 50, or you are at high risk for heart disease, you may need your cholesterol levels checked more frequently. Ongoing high lipid and cholesterol levels should be treated with medicines if diet and exercise are not working.  If you smoke, find out from your health care provider how to quit. If you do not use tobacco, do not start.  Lung cancer screening is recommended for adults aged 55-80 years who are at high risk for developing lung cancer because of a history of smoking. A yearly low-dose CT scan of the lungs is recommended for people who have at least a 30-pack-year history of smoking and are a current smoker or have quit within the past 15 years. A pack year of smoking is smoking an average of 1 pack of cigarettes a day for 1 year (for example: 1 pack a day for 30 years or 2 packs a day   for 15 years). Yearly screening should continue until the smoker has stopped smoking for at least 15 years. Yearly screening should be stopped for people who develop a health problem that would prevent them from having lung cancer treatment.  Avoid use of street drugs. Do not share needles with anyone. Ask for help if you need support or instructions about stopping the use of drugs.  High blood pressure causes heart disease and increases the risk of stroke.  Ongoing high blood pressure should be treated with medicines if weight loss and exercise do not work.  If you are 29-34 years old, ask your health care provider if you should take  aspirin to prevent strokes.  Diabetes screening involves taking a blood sample to check your fasting blood sugar level. This should be done once every 3 years, after age 27, if you are within normal weight and without risk factors for diabetes. Testing should be considered at a younger age or be carried out more frequently if you are overweight and have at least 1 risk factor for diabetes.  Breast cancer screening is essential preventive care for women. You should practice "breast self-awareness." This means understanding the normal appearance and feel of your breasts and may include breast self-examination. Any changes detected, no matter how small, should be reported to a health care provider. Women in their 64s and 30s should have a clinical breast exam (CBE) by a health care provider as part of a regular health exam every 1 to 3 years. After age 69, women should have a CBE every year. Starting at age 6, women should consider having a mammogram (breast X-ray test) every year. Women who have a family history of breast cancer should talk to their health care provider about genetic screening. Women at a high risk of breast cancer should talk to their health care providers about having an MRI and a mammogram every year.  Breast cancer gene (BRCA)-related cancer risk assessment is recommended for women who have family members with BRCA-related cancers. BRCA-related cancers include breast, ovarian, tubal, and peritoneal cancers. Having family members with these cancers may be associated with an increased risk for harmful changes (mutations) in the breast cancer genes BRCA1 and BRCA2. Results of the assessment will determine the need for genetic counseling and BRCA1 and BRCA2 testing.  Routine pelvic exams to screen for cancer are no longer recommended for nonpregnant women who are considered low risk for cancer of the pelvic organs (ovaries, uterus, and vagina) and who do not have symptoms. Ask your health  care provider if a screening pelvic exam is right for you.  If you have had past treatment for cervical cancer or a condition that could lead to cancer, you need Pap tests and screening for cancer for at least 20 years after your treatment. If Pap tests have been discontinued, your risk factors (such as having a new sexual partner) need to be reassessed to determine if screening should be resumed. Some women have medical problems that increase the chance of getting cervical cancer. In these cases, your health care provider may recommend more frequent screening and Pap tests.    Colorectal cancer can be detected and often prevented. Most routine colorectal cancer screening begins at the age of 21 years and continues through age 8 years. However, your health care provider may recommend screening at an earlier age if you have risk factors for colon cancer. On a yearly basis, your health care provider may provide home test kits to check  for hidden blood in the stool. Use of a small camera at the end of a tube, to directly examine the colon (sigmoidoscopy or colonoscopy), can detect the earliest forms of colorectal cancer. Talk to your health care provider about this at age 50, when routine screening begins.  Direct exam of the colon should be repeated every 5-10 years through age 75 years, unless early forms of pre-cancerous polyps or small growths are found.  Osteoporosis is a disease in which the bones lose minerals and strength with aging. This can result in serious bone fractures or breaks. The risk of osteoporosis can be identified using a bone density scan. Women ages 65 years and over and women at risk for fractures or osteoporosis should discuss screening with their health care providers. Ask your health care provider whether you should take a calcium supplement or vitamin D to reduce the rate of osteoporosis.  Menopause can be associated with physical symptoms and risks. Hormone replacement therapy  is available to decrease symptoms and risks. You should talk to your health care provider about whether hormone replacement therapy is right for you.  Use sunscreen. Apply sunscreen liberally and repeatedly throughout the day. You should seek shade when your shadow is shorter than you. Protect yourself by wearing long sleeves, pants, a wide-brimmed hat, and sunglasses year round, whenever you are outdoors.  Once a month, do a whole body skin exam, using a mirror to look at the skin on your back. Tell your health care provider of new moles, moles that have irregular borders, moles that are larger than a pencil eraser, or moles that have changed in shape or color.  Stay current with required vaccines (immunizations).  Influenza vaccine. All adults should be immunized every year.  Tetanus, diphtheria, and acellular pertussis (Td, Tdap) vaccine. Pregnant women should receive 1 dose of Tdap vaccine during each pregnancy. The dose should be obtained regardless of the length of time since the last dose. Immunization is preferred during the 27th-36th week of gestation. An adult who has not previously received Tdap or who does not know her vaccine status should receive 1 dose of Tdap. This initial dose should be followed by tetanus and diphtheria toxoids (Td) booster doses every 10 years. Adults with an unknown or incomplete history of completing a 3-dose immunization series with Td-containing vaccines should begin or complete a primary immunization series including a Tdap dose. Adults should receive a Td booster every 10 years.    Zoster vaccine. One dose is recommended for adults aged 60 years or older unless certain conditions are present.    Pneumococcal 13-valent conjugate (PCV13) vaccine. When indicated, a person who is uncertain of her immunization history and has no record of immunization should receive the PCV13 vaccine. An adult aged 19 years or older who has certain medical conditions and has not  been previously immunized should receive 1 dose of PCV13 vaccine. This PCV13 should be followed with a dose of pneumococcal polysaccharide (PPSV23) vaccine. The PPSV23 vaccine dose should be obtained at least 1 or more year(s) after the dose of PCV13 vaccine. An adult aged 19 years or older who has certain medical conditions and previously received 1 or more doses of PPSV23 vaccine should receive 1 dose of PCV13. The PCV13 vaccine dose should be obtained 1 or more years after the last PPSV23 vaccine dose.    Pneumococcal polysaccharide (PPSV23) vaccine. When PCV13 is also indicated, PCV13 should be obtained first. All adults aged 65 years and older should   be immunized. An adult younger than age 65 years who has certain medical conditions should be immunized. Any person who resides in a nursing home or long-term care facility should be immunized. An adult smoker should be immunized. People with an immunocompromised condition and certain other conditions should receive both PCV13 and PPSV23 vaccines. People with human immunodeficiency virus (HIV) infection should be immunized as soon as possible after diagnosis. Immunization during chemotherapy or radiation therapy should be avoided. Routine use of PPSV23 vaccine is not recommended for American Indians, [place] Natives, or people younger than 65 years unless there are medical conditions that require PPSV23 vaccine. When indicated, people who have unknown immunization and have no record of immunization should receive PPSV23 vaccine. One-time revaccination 5 years after the first dose of PPSV23 is recommended for people aged 19-64 years who have chronic kidney failure, nephrotic syndrome, asplenia, or immunocompromised conditions. People who received 1-2 doses of PPSV23 before age 65 years should receive another dose of PPSV23 vaccine at age 65 years or later if at least 5 years have passed since the previous dose. Doses of PPSV23 are not needed for people immunized  with PPSV23 at or after age 65 years.   Preventive Services / Frequency  Ages 65 years and over  Blood pressure check.  Lipid and cholesterol check.  Lung cancer screening. / Every year if you are aged 55-80 years and have a 30-pack-year history of smoking and currently smoke or have quit within the past 15 years. Yearly screening is stopped once you have quit smoking for at least 15 years or develop a health problem that would prevent you from having lung cancer treatment.  Clinical breast exam.** / Every year after age 40 years.   BRCA-related cancer risk assessment.** / For women who have family members with a BRCA-related cancer (breast, ovarian, tubal, or peritoneal cancers).  Mammogram.** / Every year beginning at age 40 years and continuing for as long as you are in good health. Consult with your health care provider.  Pap test.** / Every 3 years starting at age 30 years through age 65 or 70 years with 3 consecutive normal Pap tests. Testing can be stopped between 65 and 70 years with 3 consecutive normal Pap tests and no abnormal Pap or HPV tests in the past 10 years.  Fecal occult blood test (FOBT) of stool. / Every year beginning at age 50 years and continuing until age 75 years. You may not need to do this test if you get a colonoscopy every 10 years.  Flexible sigmoidoscopy or colonoscopy.** / Every 5 years for a flexible sigmoidoscopy or every 10 years for a colonoscopy beginning at age 50 years and continuing until age 75 years.  Hepatitis C blood test.** / For all people born from 1945 through 1965 and any individual with known risks for hepatitis C.  Osteoporosis screening.** / A one-time screening for women ages 65 years and over and women at risk for fractures or osteoporosis.  Skin self-exam. / Monthly.  Influenza vaccine. / Every year.  Tetanus, diphtheria, and acellular pertussis (Tdap/Td) vaccine.** / 1 dose of Td every 10 years.  Zoster vaccine.** / 1 dose  for adults aged 60 years or older.  Pneumococcal 13-valent conjugate (PCV13) vaccine.** / Consult your health care provider.  Pneumococcal polysaccharide (PPSV23) vaccine.** / 1 dose for all adults aged 65 years and older. Screening for abdominal aortic aneurysm (AAA)  by ultrasound is recommended for people who have history of high blood pressure   or who are current or former smokers. ++++++++++++++++++++ Recommend Adult Low Dose Aspirin or  coated  Aspirin 81 mg daily  To reduce risk of Colon Cancer 20 %,  Skin Cancer 26 % ,  Melanoma 46%  and  Pancreatic cancer 60% ++++++++++++++++++++ Vitamin D goal  is between 70-100.  Please make sure that you are taking your Vitamin D as directed.  It is very important as a natural anti-inflammatory  helping hair, skin, and nails, as well as reducing stroke and heart attack risk.  It helps your bones and helps with mood. It also decreases numerous cancer risks so please take it as directed.  Low Vit D is associated with a 200-300% higher risk for CANCER  and 200-300% higher risk for HEART   ATTACK  &  STROKE.   ...................................... It is also associated with higher death rate at younger ages,  autoimmune diseases like Rheumatoid arthritis, Lupus, Multiple Sclerosis.    Also many other serious conditions, like depression, Alzheimer's Dementia, infertility, muscle aches, fatigue, fibromyalgia - just to name a few. ++++++++++++++++++ Recommend the book "The END of DIETING" by Dr Joel Fuhrman  & the book "The END of DIABETES " by Dr Joel Fuhrman At [employer] - get book & Audio CD's    Being diabetic has a  300% increased risk for heart attack, stroke, cancer, and alzheimer- type vascular dementia. It is very important that you work harder with diet by avoiding all foods that are white. Avoid white rice (brown & wild rice is OK), white potatoes (sweetpotatoes in moderation is OK), White bread or wheat bread or anything made out of  white flour like bagels, donuts, rolls, buns, biscuits, cakes, pastries, cookies, pizza crust, and pasta (made from white flour & egg whites) - vegetarian pasta or spinach or wheat pasta is OK. Multigrain breads like Arnold's or Pepperidge [employer], or multigrain sandwich thins or flatbreads.  Diet, exercise and weight loss can reverse and cure diabetes in the early stages.  Diet, exercise and weight loss is very important in the control and prevention of complications of diabetes which affects every system in your body, ie. Brain - dementia/stroke, eyes - glaucoma/blindness, heart - heart attack/heart failure, kidneys - dialysis, stomach - gastric paralysis, intestines - malabsorption, nerves - severe painful neuritis, circulation - gangrene & loss of a leg(s), and finally cancer and Alzheimers.    I recommend avoid fried & greasy foods,  sweets/candy, white rice (brown or wild rice or Quinoa is OK), white potatoes (sweet potatoes are OK) - anything made from white flour - bagels, doughnuts, rolls, buns, biscuits,white and wheat breads, pizza crust and traditional pasta made of white flour & egg white(vegetarian pasta or spinach or wheat pasta is OK).  Multi-grain bread is OK - like multi-grain flat bread or sandwich thins. Avoid alcohol in excess. Exercise is also important.    Eat all the vegetables you want - avoid meat, especially red meat and dairy - especially cheese.  Cheese is the most concentrated form of trans-fats which is the worst thing to clog up our arteries. Veggie cheese is OK which can be found in the fresh produce section at [employer] or [employer] or [employer]  +++++++++++++++++++ DASH Eating Plan  DASH stands for "Dietary Approaches to Stop Hypertension."   The DASH eating plan is a healthy eating plan that has been shown to reduce high blood pressure (hypertension). Additional health benefits may include reducing the risk of type 2 diabetes mellitus, heart disease,   and stroke. The  DASH eating plan may also help with weight loss. WHAT DO I NEED TO KNOW ABOUT THE DASH EATING PLAN? For the DASH eating plan, you will follow these general guidelines:  Choose foods with a percent daily value for sodium of less than 5% (as listed on the food label).  Use salt-free seasonings or herbs instead of table salt or sea salt.  Check with your health care provider or pharmacist before using salt substitutes.  Eat lower-sodium products, often labeled as "lower sodium" or "no salt added."  Eat fresh foods.  Eat more vegetables, fruits, and low-fat dairy products.  Choose whole grains. Look for the word "whole" as the first word in the ingredient list.  Choose fish   Limit sweets, desserts, sugars, and sugary drinks.  Choose heart-healthy fats.  Eat veggie cheese   Eat more home-cooked food and less restaurant, buffet, and fast food.  Limit fried foods.  Cook foods using methods other than frying.  Limit canned vegetables. If you do use them, rinse them well to decrease the sodium.  When eating at a restaurant, ask that your food be prepared with less salt, or no salt if possible.                      WHAT FOODS CAN I EAT? Read Dr Fara Olden Fuhrman's books on The End of Dieting & The End of Diabetes  Grains Whole grain or whole wheat bread. Brown rice. Whole grain or whole wheat pasta. Quinoa, bulgur, and whole grain cereals. Low-sodium cereals. Corn or whole wheat flour tortillas. Whole grain cornbread. Whole grain crackers. Low-sodium crackers.  Vegetables Fresh or frozen vegetables (raw, steamed, roasted, or grilled). Low-sodium or reduced-sodium tomato and vegetable juices. Low-sodium or reduced-sodium tomato sauce and paste. Low-sodium or reduced-sodium canned vegetables.   Fruits All fresh, canned (in natural juice), or frozen fruits.  Protein Products  All fish and seafood.  Dried beans, peas, or lentils. Unsalted nuts and seeds. Unsalted canned  beans.  Dairy Low-fat dairy products, such as skim or 1% milk, 2% or reduced-fat cheeses, low-fat ricotta or cottage cheese, or plain low-fat yogurt. Low-sodium or reduced-sodium cheeses.  Fats and Oils Tub margarines without trans fats. Light or reduced-fat mayonnaise and salad dressings (reduced sodium). Avocado. Safflower, olive, or canola oils. Natural peanut or almond butter.  Other Unsalted popcorn and pretzels. The items listed above may not be a complete list of recommended foods or beverages. Contact your dietitian for more options.  +++++++++++++++  WHAT FOODS ARE NOT RECOMMENDED? Grains/ White flour or wheat flour White bread. White pasta. White rice. Refined cornbread. Bagels and croissants. Crackers that contain trans fat.  Vegetables  Creamed or fried vegetables. Vegetables in a . Regular canned vegetables. Regular canned tomato sauce and paste. Regular tomato and vegetable juices.  Fruits Dried fruits. Canned fruit in light or heavy syrup. Fruit juice.  Meat and Other Protein Products Meat in general - RED meat & White meat.  Fatty cuts of meat. Ribs, chicken wings, all processed meats as bacon, sausage, bologna, salami, fatback, hot dogs, bratwurst and packaged luncheon meats.  Dairy Whole or 2% milk, cream, half-and-half, and cream cheese. Whole-fat or sweetened yogurt. Full-fat cheeses or blue cheese. Non-dairy creamers and whipped toppings. Processed cheese, cheese spreads, or cheese curds.  Condiments Onion and garlic salt, seasoned salt, table salt, and sea salt. Canned and packaged gravies. Worcestershire sauce. Tartar sauce. Barbecue sauce. Teriyaki sauce. Soy sauce, including reduced  sodium. Steak sauce. Fish sauce. Oyster sauce. Cocktail sauce. Horseradish. Ketchup and mustard. Meat flavorings and tenderizers. Bouillon cubes. Hot sauce. Tabasco sauce. Marinades. Taco seasonings. Relishes.  Fats and Oils Butter, stick margarine, lard, shortening and bacon  fat. Coconut, palm kernel, or palm oils. Regular salad dressings.  Pickles and olives. Salted popcorn and pretzels.  The items listed above may not be a complete list of foods and beverages to avoid.

## 2018-01-03 LAB — URINALYSIS, ROUTINE W REFLEX MICROSCOPIC
BILIRUBIN URINE: NEGATIVE
Bacteria, UA: NONE SEEN /HPF
Glucose, UA: NEGATIVE
Hyaline Cast: NONE SEEN /LPF
KETONES UR: NEGATIVE
LEUKOCYTES UA: NEGATIVE
NITRITE: NEGATIVE
PH: 5.5 (ref 5.0–8.0)
Protein, ur: NEGATIVE
SPECIFIC GRAVITY, URINE: 1.015 (ref 1.001–1.03)
Squamous Epithelial / LPF: NONE SEEN /HPF (ref <=5)

## 2018-01-03 LAB — COMPLETE METABOLIC PANEL WITH GFR
AG Ratio: 1.5 (calc) (ref 1.0–2.5)
ALBUMIN MSPROF: 3.7 g/dL (ref 3.6–5.1)
ALKALINE PHOSPHATASE (APISO): 104 U/L (ref 33–130)
ALT: 9 U/L (ref 6–29)
AST: 18 U/L (ref 10–35)
BUN/Creatinine Ratio: 18 (calc) (ref 6–22)
BUN: 16 mg/dL (ref 7–25)
CALCIUM: 8.8 mg/dL (ref 8.6–10.4)
CO2: 27 mmol/L (ref 20–32)
CREATININE: 0.91 mg/dL — AB (ref 0.60–0.88)
Chloride: 111 mmol/L — ABNORMAL HIGH (ref 98–110)
GFR, EST NON AFRICAN AMERICAN: 59 mL/min/{1.73_m2} — AB (ref >=60)
GFR, Est African American: 69 mL/min/{1.73_m2} (ref >=60)
GLOBULIN: 2.5 g/dL (ref 1.9–3.7)
GLUCOSE: 62 mg/dL — AB (ref 65–99)
Potassium: 5 mmol/L (ref 3.5–5.3)
SODIUM: 144 mmol/L (ref 135–146)
Total Bilirubin: 0.6 mg/dL (ref 0.2–1.2)
Total Protein: 6.2 g/dL (ref 6.1–8.1)

## 2018-01-03 LAB — LIPID PANEL
CHOL/HDL RATIO: 3 (calc) (ref <5.0)
CHOLESTEROL: 128 mg/dL (ref <200)
HDL: 42 mg/dL — ABNORMAL LOW (ref >50)
LDL CHOLESTEROL (CALC): 67 mg/dL
Non-HDL Cholesterol (Calc): 86 mg/dL (calc) (ref <130)
Triglycerides: 109 mg/dL (ref <150)

## 2018-01-03 LAB — VITAMIN D 25 HYDROXY (VIT D DEFICIENCY, FRACTURES): Vit D, 25-Hydroxy: 56 ng/mL (ref 30–100)

## 2018-01-03 LAB — MICROALBUMIN / CREATININE URINE RATIO
Creatinine, Urine: 77 mg/dL (ref 20–275)
MICROALB UR: 2.4 mg/dL
Microalb Creat Ratio: 31 mcg/mg creat — ABNORMAL HIGH (ref <30)

## 2018-01-03 LAB — HEMOGLOBIN A1C
EAG (MMOL/L): 5.5 (calc)
Hgb A1c MFr Bld: 5.1 % of total Hgb (ref <5.7)
Mean Plasma Glucose: 100 (calc)

## 2018-01-03 LAB — TSH: TSH: 1.47 mIU/L (ref 0.40–4.50)

## 2018-01-03 LAB — INSULIN, RANDOM: Insulin: 5.3 u[IU]/mL (ref 2.0–19.6)

## 2018-01-03 LAB — MAGNESIUM: MAGNESIUM: 1.8 mg/dL (ref 1.5–2.5)

## 2018-01-05 ENCOUNTER — Other Ambulatory Visit: Payer: Self-pay | Admitting: Internal Medicine

## 2018-01-10 ENCOUNTER — Other Ambulatory Visit: Payer: Self-pay | Admitting: Internal Medicine

## 2018-01-20 ENCOUNTER — Ambulatory Visit (INDEPENDENT_AMBULATORY_CARE_PROVIDER_SITE_OTHER): Payer: Medicare Other | Admitting: *Deleted

## 2018-01-20 DIAGNOSIS — Z5181 Encounter for therapeutic drug level monitoring: Secondary | ICD-10-CM

## 2018-01-20 DIAGNOSIS — I4891 Unspecified atrial fibrillation: Secondary | ICD-10-CM

## 2018-01-20 DIAGNOSIS — Z7901 Long term (current) use of anticoagulants: Secondary | ICD-10-CM

## 2018-01-20 LAB — POCT INR: INR: 3.4

## 2018-01-20 NOTE — Patient Instructions (Signed)
Description   Do not take coumadin today May 17th then continue taking 1 tablet daily except 1.5 tablets on Mondays, Wednesdays,and Fridays.  Recheck INR 2 weeks. Call us with any medication changes or concerns # [phone].

## 2018-02-03 ENCOUNTER — Ambulatory Visit (INDEPENDENT_AMBULATORY_CARE_PROVIDER_SITE_OTHER): Payer: Medicare Other | Admitting: *Deleted

## 2018-02-03 DIAGNOSIS — Z7901 Long term (current) use of anticoagulants: Secondary | ICD-10-CM | POA: Diagnosis not present

## 2018-02-03 DIAGNOSIS — I4891 Unspecified atrial fibrillation: Secondary | ICD-10-CM | POA: Diagnosis not present

## 2018-02-03 LAB — POCT INR: INR: 3.2 — AB (ref 2.0–3.0)

## 2018-02-03 NOTE — Patient Instructions (Signed)
Description   Today take 1 tablet then start taking 1 tablet daily except 1.5 tablets on Mondays and Fridays.  Recheck INR 2 weeks. Call us with any medication changes or concerns # [phone].

## 2018-02-17 ENCOUNTER — Ambulatory Visit (INDEPENDENT_AMBULATORY_CARE_PROVIDER_SITE_OTHER): Payer: Medicare Other | Admitting: *Deleted

## 2018-02-17 DIAGNOSIS — I4891 Unspecified atrial fibrillation: Secondary | ICD-10-CM

## 2018-02-17 DIAGNOSIS — Z7901 Long term (current) use of anticoagulants: Secondary | ICD-10-CM | POA: Diagnosis not present

## 2018-02-17 LAB — POCT INR: INR: 2.9 (ref 2.0–3.0)

## 2018-02-17 NOTE — Patient Instructions (Signed)
Description   Continue taking 1 tablet daily except 1.5 tablets on Mondays and Fridays.  Recheck INR 3 weeks. Call us with any medication changes or concerns # [phone].      

## 2018-03-01 ENCOUNTER — Other Ambulatory Visit: Payer: Self-pay | Admitting: *Deleted

## 2018-03-01 ENCOUNTER — Ambulatory Visit (INDEPENDENT_AMBULATORY_CARE_PROVIDER_SITE_OTHER): Payer: Medicare Other | Admitting: *Deleted

## 2018-03-01 ENCOUNTER — Telehealth: Payer: Self-pay

## 2018-03-01 DIAGNOSIS — I495 Sick sinus syndrome: Secondary | ICD-10-CM | POA: Diagnosis not present

## 2018-03-01 MED ORDER — METOPROLOL TARTRATE 100 MG PO TABS: 100.0000 mg | ORAL_TABLET | Freq: Two times a day (BID) | ORAL | 1 refills | Status: DC

## 2018-03-01 MED ORDER — WARFARIN SODIUM 2 MG PO TABS: ORAL_TABLET | ORAL | 0 refills | Status: DC

## 2018-03-01 NOTE — Telephone Encounter (Signed)
LMOVM reminding pt to send remote transmission.   

## 2018-03-01 NOTE — Progress Notes (Signed)
Remote pacemaker transmission.   

## 2018-03-02 ENCOUNTER — Encounter: Payer: Self-pay | Admitting: Cardiology

## 2018-03-02 LAB — CUP PACEART REMOTE DEVICE CHECK
Battery Impedance: 1273 Ohm
Battery Voltage: 2.78 V
Brady Statistic AP VS Percent: 72 %
Brady Statistic AS VP Percent: 0 %
Implantable Lead Implant Date: 20111027
Implantable Lead Implant Date: 20111027
Implantable Lead Location: 753859
Implantable Lead Model: 4469
Implantable Lead Model: 4470
Implantable Lead Serial Number: 548229
Implantable Lead Serial Number: 686026
Implantable Pulse Generator Implant Date: 20111027
Lead Channel Impedance Value: 471 Ohm
Lead Channel Pacing Threshold Pulse Width: 0.4 ms
Lead Channel Setting Pacing Amplitude: 2.5 V
MDC IDC LEAD LOCATION: 753860
MDC IDC MSMT BATTERY REMAINING LONGEVITY: 49 mo
MDC IDC MSMT LEADCHNL RA PACING THRESHOLD AMPLITUDE: 0.375 V
MDC IDC MSMT LEADCHNL RV IMPEDANCE VALUE: 393 Ohm
MDC IDC MSMT LEADCHNL RV PACING THRESHOLD AMPLITUDE: 1.25 V
MDC IDC MSMT LEADCHNL RV PACING THRESHOLD PULSEWIDTH: 0.4 ms
MDC IDC SESS DTM: 20190626163614
MDC IDC SET LEADCHNL RA PACING AMPLITUDE: 2 V
MDC IDC SET LEADCHNL RV PACING PULSEWIDTH: 0.4 ms
MDC IDC SET LEADCHNL RV SENSING SENSITIVITY: 2 mV
MDC IDC STAT BRADY AP VP PERCENT: 0 %
MDC IDC STAT BRADY AS VS PERCENT: 27 %

## 2018-03-03 DIAGNOSIS — C169 Malignant neoplasm of stomach, unspecified: Secondary | ICD-10-CM | POA: Diagnosis not present

## 2018-03-03 DIAGNOSIS — K209 Esophagitis, unspecified: Secondary | ICD-10-CM | POA: Diagnosis not present

## 2018-03-03 DIAGNOSIS — R110 Nausea: Secondary | ICD-10-CM | POA: Diagnosis not present

## 2018-03-08 ENCOUNTER — Other Ambulatory Visit: Payer: Self-pay | Admitting: General Surgery

## 2018-03-08 DIAGNOSIS — C169 Malignant neoplasm of stomach, unspecified: Secondary | ICD-10-CM

## 2018-03-10 ENCOUNTER — Ambulatory Visit: Payer: Medicare Other | Admitting: *Deleted

## 2018-03-10 DIAGNOSIS — Z5181 Encounter for therapeutic drug level monitoring: Secondary | ICD-10-CM | POA: Diagnosis not present

## 2018-03-10 DIAGNOSIS — I4891 Unspecified atrial fibrillation: Secondary | ICD-10-CM

## 2018-03-10 DIAGNOSIS — Z7901 Long term (current) use of anticoagulants: Secondary | ICD-10-CM

## 2018-03-10 LAB — POCT INR: INR: 2.4 (ref 2.0–3.0)

## 2018-03-10 NOTE — Patient Instructions (Signed)
Description   Continue taking 1 tablet daily except 1.5 tablets on Mondays and Fridays.  Recheck INR 4 weeks. Call us with any medication changes or concerns # [phone].      

## 2018-03-13 ENCOUNTER — Ambulatory Visit (INDEPENDENT_AMBULATORY_CARE_PROVIDER_SITE_OTHER): Payer: Medicare Other | Admitting: Adult Health

## 2018-03-13 ENCOUNTER — Encounter: Payer: Self-pay | Admitting: Adult Health

## 2018-03-13 VITALS — BP 136/70 | HR 67 | Temp 97.5°F | Ht 59.0 in | Wt 100.0 lb

## 2018-03-13 DIAGNOSIS — Z905 Acquired absence of kidney: Secondary | ICD-10-CM

## 2018-03-13 DIAGNOSIS — N183 Chronic kidney disease, stage 3 unspecified: Secondary | ICD-10-CM

## 2018-03-13 DIAGNOSIS — R310 Gross hematuria: Secondary | ICD-10-CM | POA: Diagnosis not present

## 2018-03-13 DIAGNOSIS — Z85528 Personal history of other malignant neoplasm of kidney: Secondary | ICD-10-CM | POA: Diagnosis not present

## 2018-03-13 NOTE — Patient Instructions (Signed)
Please call urology and try to schedule an appointment as soon as possible  Call coumadin clinic if we end up start an antibiotic   Go to ER if you stop making urine   Hematuria, Adult Hematuria is blood in your urine. It can be caused by a bladder infection, kidney infection, prostate infection, kidney stone, or cancer of your urinary tract. Infections can usually be treated with medicine, and a kidney stone usually will pass through your urine. If neither of these is the cause of your hematuria, further workup to find out the reason may be needed. It is very important that you tell your health care provider about any blood you see in your urine, even if the blood stops without treatment or happens without causing pain. Blood in your urine that happens and then stops and then happens again can be a symptom of a very serious condition. Also, pain is not a symptom in the initial stages of many urinary cancers. Follow these instructions at home:  Drink lots of fluid, 3-4 quarts a day. If you have been diagnosed with an infection, cranberry juice is especially recommended, in addition to large amounts of water.  Avoid caffeine, tea, and carbonated beverages because they tend to irritate the bladder.  Avoid alcohol because it may irritate the prostate.  Take all medicines as directed by your health care provider.  If you were prescribed an antibiotic medicine, finish it all even if you start to feel better.  If you have been diagnosed with a kidney stone, follow your health care provider's instructions regarding straining your urine to catch the stone.  Empty your bladder often. Avoid holding urine for long periods of time.  After a bowel movement, women should cleanse front to back. Use each tissue only once.  Empty your bladder before and after sexual intercourse if you are a female. Contact a health care provider if:  You develop back pain.  You have a fever.  You have a feeling of  sickness in your stomach (nausea) or vomiting.  Your symptoms are not better in 3 days. Return sooner if you are getting worse. Get help right away if:  You develop severe vomiting and are unable to keep the medicine down.  You develop severe back or abdominal pain despite taking your medicines.  You begin passing a large amount of blood or clots in your urine.  You feel extremely weak or faint, or you pass out. This information is not intended to replace advice given to you by your health care provider. Make sure you discuss any questions you have with your health care provider. Document Released: 08/23/2005 Document Revised: 01/29/2016 Document Reviewed: 04/23/2013 Elsevier Interactive Patient Education  2017 [name].

## 2018-03-13 NOTE — Progress Notes (Signed)
Assessment and Plan:  [name] was seen today for hematuria  Diagnoses and all orders for this visit:  Gross hematuria Schedule follow up with Dr. Cy Blamer office, ER precautions given if urine output stops, will start abx if indicated by urinalysis - reminded to call coumadin clinic for dose adjustment -     Urinalysis w microscopic + reflex cultur -     CBC with Differential/Platelet -     BASIC METABOLIC PANEL WITH GFR -     Ambulatory referral to Urology -     Protime-INR  CKD (chronic kidney disease), stage III ([place]) -     BASIC METABOLIC PANEL WITH GFR  H/O left nephrectomy/ renal carcinoma -     Ambulatory referral to Urology  Further disposition pending results of labs. Discussed med's effects and SE's.   Over 15 minutes of exam, counseling, chart review, and critical decision making was performed.   Future Appointments  Date Time Provider [place]  03/13/2018  3:00 PM [name], NP [facility] None  03/21/2018  3:30 PM [facility] 1 [facility]  03/21/2018  4:00 PM [facility] CT 1 [facility]  04/07/2018 11:15 AM [facility]  04/20/2018 11:00 AM [name], NP [facility] None  05/31/2018 10:45 AM [facility] DEVICE REMOTES [facility]  07/24/2018 11:00 AM [name], MD [facility] None  01/30/2019  2:00 PM [name], MD [facility] None    ------------------------------------------------------------------------------------------------------------------   HPI 84 y.o.female with hx of renal carcinoma s/p left nephrectomy by Dr. Risa [name] (released 2 years ago), gastric cancer and uterine cancer, paroxysmal a. Fib on coumadin (chadsvasc of 5) managed by a. Fib clinic presents for hematuria; she reports she had been having intermittent mild lower back pain, would resolve quickly with tylenol, now having lower abdominal pressure and lower back pressure. Hematuria was noted 2 evenings ago; she  reports she noted burgundy/brown urine, she reports that has cleared gradually, today she doesn't note red color, but seems cloudy. Denies fever/chills, frequency, dysuria. She does feel her urine output is slightly lower than usual.   Patient's last INR is  Lab Results  Component Value Date   INR 2.4 03/10/2018   INR 2.9 02/17/2018   INR 3.2 (A) 02/03/2018   She has not changed her   Past Medical History:  Diagnosis Date  . Adenocarcinoma of stomach ([place]) 11/11/2015   gastric mass on egd  . Anal fissure   . Anemia    hx 3/17 iron infusion, on aranesp and injected B12 since 11/2015.   [name] Anxiety   . Aortic root dilatation ([place])   . Arthritis   . 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 CAD (coronary artery disease)   . Chronic kidney disease    only has one kidney rt lft rem 03 ca     dr. Risa [name]  . Elevated LFTs    hepatic steatosis  . Gastroparesis   . GERD (gastroesophageal reflux disease)   . History of uterine cancer 1980s   treated with hysterectomy, external radiation and radiation seed implants.   [name] HTN (hypertension)   . Hyperlipemia   . Hypothyroidism   . Iron deficiency anemia due to chronic blood loss 11/24/2015  . Moderate aortic stenosis   . Neuropathy    feet/legs  . No blood products 02/27/2016  . Osteomyelitis ([place]) as child  . PAF (paroxysmal atrial fibrillation) ([place])   . PONV (postoperative nausea and vomiting)    long time ago  . Radiation proctitis   .  Restless leg syndrome   . Stroke [facility]) 15   partial stroke left legally blind  . Tachycardia-bradycardia syndrome ([place])    s/p PPM by Dr Doreatha Lew (MDT) 07/03/10     Allergies  Allergen Reactions  . Adhesive [Tape] Other (See Comments)    Tears skin, Please use "paper" tape  . Amiodarone Other (See Comments)     Thyroid and liver and kidney problems  . Codeine Nausea And Vomiting  . Hydrocodone Other (See Comments)    hallucination  . Morphine And Related Nausea And Vomiting  . Remeron [Mirtazapine] Other (See  Comments)    Hallucinations and make pt loopy  . Requip [Ropinirole Hcl] Other (See Comments)    Headache   . Zinc Nausea Only    nausea  . Reglan [Metoclopramide] Hives, Itching and Rash    Made face red, also    Current Outpatient Medications on File Prior to Visit  Medication Sig  . acetaminophen (TYLENOL) 500 MG tablet Take 500 mg by mouth every 6 (six) hours as needed for mild pain.  [name] ALPRAZolam (XANAX) 0.5 MG tablet TAKE 1/2-1 TABLET BY MOUTH 3 TIMES A DAY AS NEEDED FOR ANXIETY  . Biotin 10000 MCG TABS Take 1 tablet by mouth daily.  . Cholecalciferol (VITAMIN D PO) Take 5,000 Units by mouth daily.  [name] diltiazem (CARDIZEM CD) 180 MG 24 hr capsule TAKE 1 CAPSULE (180 MG TOTAL) BY MOUTH DAILY.  . furosemide (LASIX) 20 MG tablet TAKE 1 TABLET BY MOUTH EVERY DAY  . gabapentin (NEURONTIN) 300 MG capsule Take 300 mg by mouth 2 (two) times daily.  [name] levothyroxine (SYNTHROID, LEVOTHROID) 100 MCG tablet Take 50 mcg by mouth daily before breakfast.  . metoprolol tartrate (LOPRESSOR) 100 MG tablet Take 1 tablet (100 mg total) by mouth 2 (two) times daily.  . nitroGLYCERIN (NITROSTAT) 0.4 MG SL tablet Place 1 tablet (0.4 mg total) under the tongue every 5 (five) minutes as needed for chest pain (MAX 3 TABLETS).  . ondansetron (ZOFRAN-ODT) 4 MG disintegrating tablet Take 8 mg by mouth every 8 (eight) hours as needed for nausea or vomiting.  . potassium chloride (KLOR-CON 10) 10 MEQ tablet Take 1 tablet (10 mEq total) by mouth 2 (two) times daily.  . ranitidine (ZANTAC) 150 MG tablet Take 150 mg by mouth 2 (two) times daily.  . simethicone (MYLICON) 80 MG chewable tablet Chew 80 mg by mouth every 6 (six) hours as needed for flatulence.  . sucralfate (CARAFATE) 1 g tablet TAKE 1 TABLET (1 G TOTAL) BY MOUTH 4 (FOUR) TIMES DAILY - WITH MEALS AND AT BEDTIME.  [name] warfarin (COUMADIN) 2 MG tablet TAKE 1 TABLET DAILY OR AS DIRECTED   No current facility-administered medications on file prior to visit.      ROS: all negative except above.   Physical Exam:  There were no vitals taken for this visit.  General Appearance: Well nourished, in no apparent distress. Eyes: PERRLA, conjunctiva no swelling or erythema ENT/Mouth: No erythema, swelling, or exudate on post pharynx.  Tonsils not swollen or erythematous. Hearing normal.  Neck: Supple, thyroid normal.  Respiratory: Respiratory effort normal, BS equal bilaterally without rales, rhonchi, wheezing or stridor.  Cardio: RRR with no MRGs. Brisk peripheral pulses without edema.  Abdomen: Soft, + BS.  Mild generalized tenderness, worse suprapubic, no guarding, rebound, hernias, palpable masses. Lymphatics: Non tender without lymphadenopathy.  Musculoskeletal: Full ROM, 5/5 strength, slow gait with 4 point cane  Skin: Warm, dry without rashes, lesions, ecchymosis.  Psych: Awake and oriented X 3, normal affect, Insight and Judgment appropriate.     Izora Ribas, NP 2:48 PM [facility]

## 2018-03-14 LAB — CBC WITH DIFFERENTIAL/PLATELET
BASOS ABS: 46 {cells}/uL (ref 0–200)
Basophils Relative: 0.6 %
EOS ABS: 239 {cells}/uL (ref 15–500)
Eosinophils Relative: 3.1 %
HCT: 41.2 % (ref 35.0–45.0)
Hemoglobin: 14.1 g/dL (ref 11.7–15.5)
Lymphs Abs: 2603 cells/uL (ref 850–3900)
MCH: 31.9 pg (ref 27.0–33.0)
MCHC: 34.2 g/dL (ref 32.0–36.0)
MCV: 93.2 fL (ref 80.0–100.0)
MONOS PCT: 7.5 %
MPV: 10.6 fL (ref 7.5–12.5)
Neutro Abs: 4235 cells/uL (ref 1500–7800)
Neutrophils Relative %: 55 %
PLATELETS: 202 10*3/uL (ref 140–400)
RBC: 4.42 10*6/uL (ref 3.80–5.10)
RDW: 12.2 % (ref 11.0–15.0)
TOTAL LYMPHOCYTE: 33.8 %
WBC mixed population: 578 cells/uL (ref 200–950)
WBC: 7.7 10*3/uL (ref 3.8–10.8)

## 2018-03-14 LAB — URINALYSIS W MICROSCOPIC + REFLEX CULTURE
BACTERIA UA: NONE SEEN /HPF
Bilirubin Urine: NEGATIVE
Glucose, UA: NEGATIVE
Hyaline Cast: NONE SEEN /LPF
KETONES UR: NEGATIVE
Leukocyte Esterase: NEGATIVE
Nitrites, Initial: NEGATIVE
Protein, ur: NEGATIVE
SPECIFIC GRAVITY, URINE: 1.013 (ref 1.001–1.03)
SQUAMOUS EPITHELIAL / LPF: NONE SEEN /HPF (ref <=5)
WBC, UA: NONE SEEN /HPF (ref 0–5)
pH: <=5 (ref 5.0–8.0)

## 2018-03-14 LAB — BASIC METABOLIC PANEL WITH GFR
BUN/Creatinine Ratio: 18 (calc) (ref 6–22)
BUN: 19 mg/dL (ref 7–25)
CALCIUM: 9.6 mg/dL (ref 8.6–10.4)
CO2: 30 mmol/L (ref 20–32)
CREATININE: 1.06 mg/dL — AB (ref 0.60–0.88)
Chloride: 105 mmol/L (ref 98–110)
GFR, EST NON AFRICAN AMERICAN: 49 mL/min/{1.73_m2} — AB (ref >=60)
GFR, Est African American: 57 mL/min/{1.73_m2} — ABNORMAL LOW (ref >=60)
Glucose, Bld: 94 mg/dL (ref 65–99)
Potassium: 5.5 mmol/L — ABNORMAL HIGH (ref 3.5–5.3)
Sodium: 143 mmol/L (ref 135–146)

## 2018-03-14 LAB — NO CULTURE INDICATED

## 2018-03-15 ENCOUNTER — Other Ambulatory Visit: Payer: Self-pay | Admitting: General Surgery

## 2018-03-15 DIAGNOSIS — C169 Malignant neoplasm of stomach, unspecified: Secondary | ICD-10-CM

## 2018-03-15 DIAGNOSIS — Z8554 Personal history of malignant neoplasm of ureter: Secondary | ICD-10-CM | POA: Diagnosis not present

## 2018-03-15 DIAGNOSIS — R310 Gross hematuria: Secondary | ICD-10-CM | POA: Diagnosis not present

## 2018-03-21 ENCOUNTER — Ambulatory Visit
Admission: RE | Admit: 2018-03-21 | Discharge: 2018-03-21 | Disposition: A | Discharge status: Home / Self Care | Payer: Medicare Other | Source: Ambulatory Visit | Attending: General Surgery | Admitting: General Surgery

## 2018-03-21 DIAGNOSIS — R319 Hematuria, unspecified: Secondary | ICD-10-CM | POA: Diagnosis not present

## 2018-03-21 DIAGNOSIS — J90 Pleural effusion, not elsewhere classified: Secondary | ICD-10-CM | POA: Diagnosis not present

## 2018-03-21 DIAGNOSIS — C169 Malignant neoplasm of stomach, unspecified: Secondary | ICD-10-CM

## 2018-03-21 MED ORDER — IOPAMIDOL (ISOVUE-300) INJECTION 61%
100.0000 mL | Freq: Once | INTRAVENOUS | Status: AC | PRN
  Administered 2018-03-21: 100 mL via INTRAVENOUS

## 2018-03-23 NOTE — Progress Notes (Signed)
Please let daughters know that the CTs are negative for any evidence of cancer.  Some arthritis changes in spine.

## 2018-03-31 ENCOUNTER — Other Ambulatory Visit: Payer: Self-pay | Admitting: Internal Medicine

## 2018-04-04 DIAGNOSIS — D414 Neoplasm of uncertain behavior of bladder: Secondary | ICD-10-CM | POA: Diagnosis not present

## 2018-04-04 DIAGNOSIS — Z8554 Personal history of malignant neoplasm of ureter: Secondary | ICD-10-CM | POA: Diagnosis not present

## 2018-04-04 DIAGNOSIS — R8290 Unspecified abnormal findings in urine: Secondary | ICD-10-CM | POA: Diagnosis not present

## 2018-04-05 ENCOUNTER — Telehealth: Payer: Self-pay | Admitting: Internal Medicine

## 2018-04-05 NOTE — Telephone Encounter (Signed)
   Primary Cardiologist:[name], MD  Electrophysiologist: Dr. Rayann Heman  Chart reviewed as part of pre-operative protocol coverage. While she has been seen in EP clinic for f/u of her PPM, she has not been seen by general cardiology in over 1 year. She has history of aortic stenosis. Last echo was in 2017 and AS was mild-moderate. She also has atrial fibrillation and is on Coumadin. Because of [name]'s past medical history and time since last visit, he/she will require a follow-up visit in order to better assess preoperative cardiovascular risk.  Pre-op covering staff: - Please schedule appointment and call patient to inform them. - Please contact requesting surgeon's office via preferred method (i.e, phone, fax) to inform them of need for appointment prior to surgery.  I will also route to pharmacy to provide recs regarding coumadin.   Lyda Jester, PA-C  04/05/2018, 3:27 PM

## 2018-04-05 NOTE — Telephone Encounter (Signed)
Pt takes warfarin for afib with CHADS2VASc score of 7 (age x2, sex, HTN, CAD, stroke). She will require bridging with Lovenox in order to hold her warfarin for 5 days prior to procedure. Will coordinate this with pt in Coumadin clinic.

## 2018-04-05 NOTE — Telephone Encounter (Signed)
New Message      [facility] Pre-operative Risk Assessment    Request for surgical clearance:  1. What type of surgery is being performed? Bladder tumor removal  2. When is this surgery scheduled? TBD  3. What type of clearance is required (medical clearance vs. Pharmacy clearance to hold med vs. Both)? Both  4. Are there any medications that need to be held prior to surgery and how long? Warfarin  5. Practice name and name of physician performing surgery? [facility]/ Dr. Gloriann Loan  6. What is your office phone number [phone] ext [phone]   7.   What is your office fax number [phone]  8.   Anesthesia type (None, local, MAC, general) ? general   [name] 04/05/2018, 8:20 AM  _________________________________________________________________   (provider comments below)

## 2018-04-05 NOTE — Telephone Encounter (Signed)
Pt Scheduled to see Kerin Ransom 8/6 for pre op clearance. RN for Dr Gloriann Loan at urologist office is aware. Pt's daughter aware of appointment time, no additional questions.

## 2018-04-07 ENCOUNTER — Ambulatory Visit: Payer: Medicare Other | Admitting: *Deleted

## 2018-04-07 DIAGNOSIS — Z7901 Long term (current) use of anticoagulants: Secondary | ICD-10-CM | POA: Diagnosis not present

## 2018-04-07 DIAGNOSIS — I4891 Unspecified atrial fibrillation: Secondary | ICD-10-CM | POA: Diagnosis not present

## 2018-04-07 LAB — POCT INR: INR: 3.6 — AB (ref 2.0–3.0)

## 2018-04-07 NOTE — Patient Instructions (Signed)
Description   Skip today's dose, then continue taking 1 tablet daily except 1.5 tablets on Mondays and Fridays.  Recheck INR 2 weeks. Call us with any medication changes or concerns # [phone].

## 2018-04-11 ENCOUNTER — Ambulatory Visit: Payer: Medicare Other | Admitting: Cardiology

## 2018-04-11 ENCOUNTER — Encounter: Payer: Self-pay | Admitting: Cardiology

## 2018-04-11 VITALS — BP 172/90 | HR 77 | Ht 59.0 in | Wt 98.8 lb

## 2018-04-11 DIAGNOSIS — I480 Paroxysmal atrial fibrillation: Secondary | ICD-10-CM

## 2018-04-11 DIAGNOSIS — Z531 Procedure and treatment not carried out because of patient's decision for reasons of belief and group pressure: Secondary | ICD-10-CM

## 2018-04-11 DIAGNOSIS — N183 Chronic kidney disease, stage 3 unspecified: Secondary | ICD-10-CM

## 2018-04-11 DIAGNOSIS — Z950 Presence of cardiac pacemaker: Secondary | ICD-10-CM

## 2018-04-11 DIAGNOSIS — Z8673 Personal history of transient ischemic attack (TIA), and cerebral infarction without residual deficits: Secondary | ICD-10-CM | POA: Insufficient documentation

## 2018-04-11 DIAGNOSIS — I10 Essential (primary) hypertension: Secondary | ICD-10-CM | POA: Diagnosis not present

## 2018-04-11 DIAGNOSIS — Z7901 Long term (current) use of anticoagulants: Secondary | ICD-10-CM | POA: Diagnosis not present

## 2018-04-11 NOTE — Assessment & Plan Note (Signed)
Pt seen today for pre op clearance prior to bladder surgery

## 2018-04-11 NOTE — Addendum Note (Signed)
Addended by: Ulice Brilliant T on: 04/11/2018 01:27 PM   Modules accepted: Orders

## 2018-04-11 NOTE — Assessment & Plan Note (Signed)
CHADS VASC=7- she will need Lovenox crossover

## 2018-04-11 NOTE — Patient Instructions (Signed)
Medication Instructions:  Your physician recommends that you continue on your current medications as directed. Please refer to the Current Medication list given to you today.  Labwork: NONE   Testing/Procedures: NONE   Follow-Up: Your physician recommends that you schedule a follow-up appointment in: 3-4 months with DR [name] ONLY  Any Other Special Instructions Will Be Listed Below (If Applicable). If you need a refill on your cardiac medications before your next appointment, please call your pharmacy.

## 2018-04-11 NOTE — Assessment & Plan Note (Signed)
Controlled- Moderate LVH on echo 

## 2018-04-11 NOTE — Assessment & Plan Note (Signed)
GFR 49 

## 2018-04-11 NOTE — Progress Notes (Signed)
04/11/2018 [name]   [birth year]  [id number]  Primary Physician [name], MD Primary Cardiologist: Dr [name]- dr Rayann Heman EP  HPI:  Pleasant 84 y/o female with a history of PAF, SSS, s/p MDT PTVDP 2012, chronic Coumadin anticoagulation, HTN, prior retinal artery embolism, and prior diastolic CHF. She is in th office today for pre operative clearance prior to bladder surgery. She lives in her own home and is able to use the stairs and do some housework. Her activity is limited by DJD, not chest pain or dyspnea.    Current Outpatient Medications  Medication Sig Dispense Refill  . acetaminophen (TYLENOL) 500 MG tablet Take 500 mg by mouth every 6 (six) hours as needed for mild pain.    [name] ALPRAZolam (XANAX) 0.5 MG tablet TAKE 1/2-1 TABLET BY MOUTH 3 TIMES A DAY AS NEEDED FOR ANXIETY 90 tablet 0  . Biotin 10000 MCG TABS Take 1 tablet by mouth daily.    . Cholecalciferol (VITAMIN D PO) Take 5,000 Units by mouth daily.    [name] diltiazem (CARDIZEM CD) 180 MG 24 hr capsule TAKE 1 CAPSULE (180 MG TOTAL) BY MOUTH DAILY. 90 capsule 1  . furosemide (LASIX) 20 MG tablet TAKE 1 TABLET BY MOUTH EVERY DAY 90 tablet 1  . gabapentin (NEURONTIN) 300 MG capsule Take 300 mg by mouth 2 (two) times daily.    [name] levothyroxine (SYNTHROID, LEVOTHROID) 100 MCG tablet Take 50 mcg by mouth daily before breakfast.    . metoprolol tartrate (LOPRESSOR) 100 MG tablet Take 1 tablet (100 mg total) by mouth 2 (two) times daily. 180 tablet 1  . nitroGLYCERIN (NITROSTAT) 0.4 MG SL tablet Place 1 tablet (0.4 mg total) under the tongue every 5 (five) minutes as needed for chest pain (MAX 3 TABLETS). 25 tablet prn  . ondansetron (ZOFRAN-ODT) 4 MG disintegrating tablet Take 8 mg by mouth every 8 (eight) hours as needed for nausea or vomiting.    . potassium chloride (KLOR-CON 10) 10 MEQ tablet Take 1 tablet (10 mEq total) by mouth 2 (two) times daily. 180 tablet 1  . ranitidine (ZANTAC) 150 MG tablet Take 150 mg by mouth 2  (two) times daily.    . simethicone (MYLICON) 80 MG chewable tablet Chew 80 mg by mouth every 6 (six) hours as needed for flatulence.    . sucralfate (CARAFATE) 1 g tablet TAKE 1 TABLET (1 G TOTAL) BY MOUTH 4 (FOUR) TIMES DAILY - WITH MEALS AND AT BEDTIME. 120 tablet 0  . sucralfate (CARAFATE) 1 g tablet TAKE 1 TABLET BEFORE MEALS AND TAKE 1 TABLET AT BEDTIME 120 tablet 1  . warfarin (COUMADIN) 2 MG tablet TAKE 1 TABLET DAILY OR AS DIRECTED (Patient taking differently: No sig reported) 100 tablet 0   No current facility-administered medications for this visit.     Allergies  Allergen Reactions  . Adhesive [Tape] Other (See Comments)    Tears skin, Please use "paper" tape  . Amiodarone Other (See Comments)     Thyroid and liver and kidney problems  . Codeine Nausea And Vomiting  . Hydrocodone Other (See Comments)    hallucination  . Morphine And Related Nausea And Vomiting  . Remeron [Mirtazapine] Other (See Comments)    Hallucinations and make pt loopy  . Requip [Ropinirole Hcl] Other (See Comments)    Headache   . Zinc Nausea Only    nausea  . Reglan [Metoclopramide] Hives, Itching and Rash    Made face red, also  Past Medical History:  Diagnosis Date  . Adenocarcinoma of stomach ([place]) 11/11/2015   gastric mass on egd  . Anal fissure   . Anemia    hx 3/17 iron infusion, on aranesp and injected B12 since 11/2015.   [name] Anxiety   . Aortic root dilatation ([place])   . Arthritis   . 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 CAD (coronary artery disease)   . Chronic kidney disease    only has one kidney rt lft rem 03 ca     dr. Risa Grill  . Elevated LFTs    hepatic steatosis  . Gastroparesis   . GERD (gastroesophageal reflux disease)   . History of uterine cancer 1980s   treated with hysterectomy, external radiation and radiation seed implants.   [name] HTN (hypertension)   . Hyperlipemia   . Hypothyroidism   . Iron deficiency anemia due to chronic blood loss 11/24/2015  . Moderate aortic  stenosis   . Neuropathy    feet/legs  . No blood products 02/27/2016  . Osteomyelitis ([place]) as child  . PAF (paroxysmal atrial fibrillation) ([place])   . PONV (postoperative nausea and vomiting)    long time ago  . Radiation proctitis   . Restless leg syndrome   . Stroke [facility]) 15   partial stroke left legally blind  . Tachycardia-bradycardia syndrome [facility])    s/p PPM by Dr Doreatha Lew (MDT) 07/03/10    Social History   Socioeconomic History  . Marital status: Widowed    Spouse name: Not on file  . Number of children: Not on file  . Years of education: Not on file  . Highest education level: Not on file  Occupational History  . Not on file  Social Needs  . Financial resource strain: Not on file  . Food insecurity:    Worry: Not on file    Inability: Not on file  . Transportation needs:    Medical: Not on file    Non-medical: Not on file  Tobacco Use  . Smoking status: Never Smoker  . Smokeless tobacco: Never Used  Substance and Sexual Activity  . Alcohol use: No  . Drug use: No  . Sexual activity: Never  Lifestyle  . Physical activity:    Days per week: Not on file    Minutes per session: Not on file  . Stress: Not on file  Relationships  . Social connections:    Talks on phone: Not on file    Gets together: Not on file    Attends religious service: Not on file    Active member of club or organization: Not on file    Attends meetings of clubs or organizations: Not on file    Relationship status: Not on file  . Intimate partner violence:    Fear of current or ex partner: Not on file    Emotionally abused: Not on file    Physically abused: Not on file    Forced sexual activity: Not on file  Other Topics Concern  . Not on file  Social History Narrative   Lives in [place].  Continues to work.     Family History  Problem Relation Age of Onset  . Colon cancer Mother 42  . Heart disease Mother   . Heart disease Father   . Heart attack Father   . Heart  disease Maternal Grandmother      Review of Systems: General: negative for chills, fever, night sweats or weight changes.  Cardiovascular: negative  for chest pain, dyspnea on exertion, edema, orthopnea, palpitations, paroxysmal nocturnal dyspnea or shortness of breath Dermatological: negative for rash Respiratory: negative for cough or wheezing Urologic: negative for hematuria Abdominal: negative for nausea, vomiting, diarrhea, bright red blood per rectum, melena, or hematemesis Neurologic: negative for visual changes, syncope, or dizziness All other systems reviewed and are otherwise negative except as noted above.    Blood pressure (!) 172/90, pulse 77, height 4\' 11"  (1.499 m), weight 98 lb 12.8 oz (44.8 kg).  General appearance: alert, cooperative, appears stated age, no distress and thin Neck: no JVD and transmitted murmur Lungs: clear to auscultation bilaterally and kyphosis Heart: regular rate and rhythm and 2/6 systolic murmur AOV Extremities: no edema Skin: Skin color, texture, turgor normal. No rashes or lesions Neurologic: Grossly normal  EKG A paced  ASSESSMENT AND PLAN:   Pre-operative cardiovascular examination Pt seen today for pre op clearance prior to bladder surgery  Long term current use of anticoagulant therapy CHADS VASC=7- she will need Lovenox crossover  Essential hypertension Controlled. Moderate LVH on echo  CKD (chronic kidney disease), stage III GFR 49  Refusal of blood transfusions as patient is Jehovah's Witness .  Cardiac pacemaker  MDT PTVDP- Dr Rayann Heman follows  History of stroke H/O retinal artery occlusion   PLAN  Pt seen and examined today in the office.  Based on ACC/AHA guidelines, KALYN HOFSTRA would be at acceptable risk for the planned procedure without further cardiovascular testing.   She can hold her Coumadin 5 days pre op but with need Lovenox crossover pre op. This will be arranged by our [facility] office Coumadin  Clinic. Also recent labs indicate her K+ was running high and I will stop her K+ supplement.   I will route this recommendation to the requesting party via Epic fax function and remove from pre-op pool.  Please call with questions. F/U with Dr [name] 4 months.   Kerin Ransom PA-C 04/11/2018 12:16 PM

## 2018-04-11 NOTE — Assessment & Plan Note (Signed)
H/O retinal artery occlusion

## 2018-04-11 NOTE — Assessment & Plan Note (Signed)
MDT PTVDP- Dr Rayann Heman follows

## 2018-04-12 ENCOUNTER — Other Ambulatory Visit: Payer: Self-pay | Admitting: Urology

## 2018-04-20 ENCOUNTER — Ambulatory Visit: Payer: Self-pay | Admitting: Adult Health

## 2018-04-20 ENCOUNTER — Ambulatory Visit: Payer: Self-pay | Admitting: Physician Assistant

## 2018-04-21 ENCOUNTER — Ambulatory Visit: Payer: Medicare Other | Admitting: Pharmacist

## 2018-04-21 DIAGNOSIS — Z7901 Long term (current) use of anticoagulants: Secondary | ICD-10-CM | POA: Diagnosis not present

## 2018-04-21 LAB — POCT INR: INR: 1.9 — AB (ref 2.0–3.0)

## 2018-04-21 MED ORDER — ENOXAPARIN SODIUM 40 MG/0.4ML ~~LOC~~ SOLN: 40.0000 mg | SUBCUTANEOUS | 1 refills | Status: DC

## 2018-04-21 NOTE — Patient Instructions (Addendum)
Continue taking 1 tablet daily except 1.5 tablets on Mondays and Fridays.   8/20: Last dose of Coumadin.  8/21: No Coumadin or Lovenox.  8/22: Inject Lovenox 40mg  in the fatty abdominal tissue at least 2 inches from the belly button at 8pm. No Coumadin.  8/23: Inject Lovenox in the fatty tissue at 8pm. No Coumadin.  8/24: Inject Lovenox in the fatty tissue at 8pm. No Coumadin.  8/25: No Lovenox. No Coumadin.  8/26: Procedure Day - No Lovenox - Resume Coumadin in the evening or as directed by doctor (take an extra half tablet with usual dose for 2 days then resume normal dose).  8/27: Resume Lovenox inject in the fatty tissue at 8am and take Coumadin.  8/28: Inject Lovenox in the fatty tissue at 8am and take Coumadin.  8/29: Inject Lovenox in the fatty tissue at 8am and take Coumadin.  8/30: Coumadin appt to check INR.

## 2018-04-25 NOTE — Patient Instructions (Signed)
[name]  04/25/2018   Your procedure is scheduled on: 05-01-18   Report to [facility] Main  Entrance    Report to admitting at 7:00AM    Call this number if you have problems the morning of surgery [phone]     Remember: Do not eat food or drink liquids :After Midnight.     Take these medicines the morning of surgery with A SIP OF WATER: diltiazem, levothyroxine, metoprolol, ranitidine, gabapentin,tylenol if needed,                                 You may not have any metal on your body including hair pins and              piercings  Do not wear jewelry, make-up, lotions, powders or perfumes, deodorant             Do not wear nail polish.  Do not shave  48 hours prior to surgery.             Do not bring valuables to the hospital. [place].  Contacts, dentures or bridgework may not be worn into surgery.      Patients discharged the day of surgery will not be allowed to drive home.  Name and phone number of your driver:  Special Instructions: N/A              Please read over the following fact sheets you were given: _____________________________________________________________________             [facility] - Preparing for Surgery Before surgery, you can play an important role.  Because skin is not sterile, your skin needs to be as free of germs as possible.  You can reduce the number of germs on your skin by washing with CHG (chlorahexidine gluconate) soap before surgery.  CHG is an antiseptic cleaner which kills germs and bonds with the skin to continue killing germs even after washing. Please DO NOT use if you have an allergy to CHG or antibacterial soaps.  If your skin becomes reddened/irritated stop using the CHG and inform your nurse when you arrive at Short Stay. Do not shave (including legs and underarms) for at least 48 hours prior to the first CHG shower.  You may shave your  face/neck. Please follow these instructions carefully:  1.  Shower with CHG Soap the night before surgery and the  morning of Surgery.  2.  If you choose to wash your hair, wash your hair first as usual with your  normal  shampoo.  3.  After you shampoo, rinse your hair and body thoroughly to remove the  shampoo.                           4.  Use CHG as you would any other liquid soap.  You can apply chg directly  to the skin and wash                       Gently with a scrungie or clean washcloth.  5.  Apply the CHG Soap to your body ONLY FROM THE NECK DOWN.   Do not  use on face/ open                           Wound or open sores. Avoid contact with eyes, ears mouth and genitals (private parts).                       Wash face,  Genitals (private parts) with your normal soap.             6.  Wash thoroughly, paying special attention to the area where your surgery  will be performed.  7.  Thoroughly rinse your body with warm water from the neck down.  8.  DO NOT shower/wash with your normal soap after using and rinsing off  the CHG Soap.                9.  Pat yourself dry with a clean towel.            10.  Wear clean pajamas.            11.  Place clean sheets on your bed the night of your first shower and do not  sleep with pets. Day of Surgery : Do not apply any lotions/deodorants the morning of surgery.  Please wear clean clothes to the hospital/surgery center.  FAILURE TO FOLLOW THESE INSTRUCTIONS MAY RESULT IN THE CANCELLATION OF YOUR SURGERY PATIENT SIGNATURE_________________________________  NURSE SIGNATURE__________________________________  ________________________________________________________________________

## 2018-04-25 NOTE — Progress Notes (Signed)
lov/Cardiac clearance 04-11-18 epic   ekg 04-11-18 epic   PTINR 8-16-9 epic   Ct chest 03-22-18 epic   Last device check 03-01-18 epic

## 2018-04-26 ENCOUNTER — Encounter (HOSPITAL_COMMUNITY): Payer: Self-pay

## 2018-04-26 ENCOUNTER — Other Ambulatory Visit: Payer: Self-pay

## 2018-04-26 ENCOUNTER — Encounter (HOSPITAL_COMMUNITY)
Admission: RE | Admit: 2018-04-26 | Discharge: 2018-04-26 | Disposition: A | Discharge status: Home / Self Care | Payer: Medicare Other | Source: Ambulatory Visit | Attending: Urology | Admitting: Urology

## 2018-04-26 DIAGNOSIS — D494 Neoplasm of unspecified behavior of bladder: Secondary | ICD-10-CM | POA: Diagnosis not present

## 2018-04-26 DIAGNOSIS — Z01812 Encounter for preprocedural laboratory examination: Secondary | ICD-10-CM | POA: Insufficient documentation

## 2018-04-26 LAB — COMPREHENSIVE METABOLIC PANEL
ALBUMIN: 3.5 g/dL (ref 3.5–5.0)
ALT: 15 U/L (ref 0–44)
AST: 23 U/L (ref 15–41)
Alkaline Phosphatase: 99 U/L (ref 38–126)
Anion gap: 7 (ref 5–15)
BILIRUBIN TOTAL: 0.8 mg/dL (ref 0.3–1.2)
BUN: 16 mg/dL (ref 8–23)
CHLORIDE: 110 mmol/L (ref 98–111)
CO2: 25 mmol/L (ref 22–32)
CREATININE: 0.94 mg/dL (ref 0.44–1.00)
Calcium: 8.6 mg/dL — ABNORMAL LOW (ref 8.9–10.3)
GFR calc Af Amer: >60 mL/min (ref >60)
GFR calc non Af Amer: 55 mL/min — ABNORMAL LOW (ref >60)
GLUCOSE: 80 mg/dL (ref 70–99)
Potassium: 4 mmol/L (ref 3.5–5.1)
Sodium: 142 mmol/L (ref 135–145)
Total Protein: 6.3 g/dL — ABNORMAL LOW (ref 6.5–8.1)

## 2018-04-26 LAB — CBC
HEMATOCRIT: 41.2 % (ref 36.0–46.0)
HEMOGLOBIN: 13.8 g/dL (ref 12.0–15.0)
MCH: 32 pg (ref 26.0–34.0)
MCHC: 33.5 g/dL (ref 30.0–36.0)
MCV: 95.6 fL (ref 78.0–100.0)
Platelets: 192 10*3/uL (ref 150–400)
RBC: 4.31 MIL/uL (ref 3.87–5.11)
RDW: 13.6 % (ref 11.5–15.5)
WBC: 6.1 10*3/uL (ref 4.0–10.5)

## 2018-04-26 LAB — NO BLOOD PRODUCTS

## 2018-04-26 NOTE — Progress Notes (Signed)
Request for pacemaker device orders faxed to [facility]

## 2018-04-26 NOTE — Progress Notes (Addendum)
PACEMAKER DEVICE ORDERS RECEIVED AND PLACED ON PATIENT CHART   BLOOD PRODUCT REFUSAL FAXED TO [facility] BLOOD BANK AND SURGEONS OFFICE .  REFUSAL OF BLOOD PRODUCTS NOTED IN FYI AND ORDER PLACED IN Epic   [facility] WIT[facility] BLOOD CARD PLACED ON PATIENT CHART

## 2018-04-26 NOTE — Progress Notes (Signed)
RN complleted phone consult with anesthesia Dr Hulan Fray . reason for consult is mild to moderate aortic valve stenois. RN made Dr Lanetta Inch aware that patient has hx of afib with pacemaker in place and managed on blood thinner coumadin. RN Also made MD aware that patient has been cleared by cardiology . MD asking when  patients last ECHO was done and what the valve area was. RN reports last ECHO was in 2017 and valve area reads 0.55cm^2 in the report; also reported that patient was stress tested in 2017 as well. Per Dr Lanetta Inch, patient may proceed as planned, no other recommendations received.

## 2018-04-30 NOTE — H&P (Signed)
CC/HPI: CC: Gross hematuria  HPI:  08/23/2016: Dr. Risa Grill:   [name] presents today for annual follow-up. She is currently 84 years of age. She has a remote history of urothelial malignancy and status post radical nephroureterectomy approximately 14 years ago. She has never had any evidence of upper or lower tract recurrence. She does have some mild stress incontinence and mild bladder overactivity. Nothing that's been progressive or particularly bothersome to her. Her urinalysis today continues to be clear. Her most recent imaging was performed in 2012. Periodic cytology testing is also been negative. She has very minimal renal insufficiency. Last year, her creatinine was 1.1. Her last cystoscopic assessment was in 2015.   She has had no hematuria. She has had no flank discomfort. Since her last visit, she was diagnosed with a stomach malignancy. She has been under the care of [name]. She did have CT imaging within the past few months in her right kidney is completely normal in appearance. Urinalysis today is clear. She apparently scattered good prognosis from her stomach malignancy.   03/15/2018:  As mentioned above, the patient is a former patient of Dr. Risa Grill. She has a history of upper tract urothelial cell carcinoma status post radical nephroureterectomy over 15 years ago. She had no sign of recurrence and was released from care. For the past several weeks, the patient has had some intermittent gross hematuria that is painless in nature. Urinalysis today shows microscopic hematuria. She does have a history of stomach cancer and is scheduled for a CT with contrast on 03/21/2018 with [facility] imaging. The patient denies significant voiding complaints. She has occasional pressure with urination but feels like she empties well with good stream.   04/04/2018:  Patient underwent a CT IVP which revealed no obvious abnormal findings. The right ureter was incompletely opacified and the distal  portion of the ureter. Otherwise, no filling defects. No renal mass. Left kidney is surgically absent as expected. There were some streak artifact limiting the evaluation of the bladder. She presents today for cystoscopy.     ALLERGIES: amiodarone Codeine Derivatives Hydrocodone morphine Reglan Zinc     MEDICATIONS: Levothyroxine Sodium  Metoprolol Succinate 50 mg tablet, extended release 24 hr  Warfarin Sodium 2 mg tablet  Acetaminophen 500 mg tablet  Alprazolam 0.5 mg tablet  Biotin  Furosemide 20 mg tablet  Gabapentin 300 mg capsule  Gas-X  Magnesium  Nitroglycerin 0.4 mg tablet, sublingual  Ondansetron Hcl 8 mg tablet  Potassium  Ranitidine Hcl 150 mg capsule  Sucralfate  Vitamin D     GU PSH: Hysterectomy Unilat SO - 2008      [facility] Notes: Knee Surgery, Kidney Surgery, Knee Replacement, Hip Surgery, Hysterectomy, Gallbladder Surgery   NON-GU PSH: Explore Small Intestine, Explore and reconstruct small intestive - 05/18/2016 Gastro/Jejuno Tube, Low-Pro - 2017 Remove Stomach, stomach cancer - 2017 Revise Knee Joint - 2013    GU PMH: Gross hematuria - 03/15/2018 History of ureteral cancer, History of cancer of ureter - 2016 Mixed incontinence, Urge and stress incontinence - 2016 Malig Neo Renal pelvis, Unspec, Malignant neoplasm of renal pelvis, unspecified laterality - 2015 Acute Cystitis/UTI, Acute cystitis without hematuria - 2014 Kidney Cancer Unspec, except renal pelvis, Carcinoma of kidney, unspecified laterality - 8016 Renal colic, Renal colic - 5537      PMH Notes: [name] is status post an open radical nephroureterectomy in the fall of 2003. The tumor was large but noninvasive and relatively well differentiated. She has not had any  evidence of any bladder tumors. The patient had an IVP performed in 2007, and then had that repeated in July of 2009. She also had a noncontrast CT scan done in May of 2010 for some nonspecific right flank pain, and nothing really  of concern was noted at that time. She remains satisfied with her voiding and does not appear to have any other major problems or issues at this point. Mild SUI no pads. Last follow up cystoscopy and negative cytology was in fall of 2014.    Subsequent imaging was performed in August 2012. All cytology has been negative. Creatinine level in 2014 was 1.3.     NON-GU PMH: Encounter for general adult medical examination without abnormal findings, Encounter for preventive health examination - 2016 Personal history of other diseases of the circulatory system, History of hypertension - 2014 Personal history of other endocrine, nutritional and metabolic disease, History of hypercholesterolemia - 2014    FAMILY HISTORY: Cancer - Mother Family Health Status Number - Mother   SOCIAL HISTORY: Marital Status: Married Preferred Language: English; Race: White Current Smoking Status: Patient has never smoked.   Tobacco Use Assessment Completed: Used Tobacco in last 30 days? Has never drank.  Does not drink caffeine. Patient's occupation is/was Retired.     Notes: Occupation:, Alcohol Use, Previous History Of Smoking, Marital History - Currently Married, Caffeine Use   REVIEW OF SYSTEMS:    GU Review Female:   Patient denies frequent urination, hard to postpone urination, burning /pain with urination, get up at night to urinate, leakage of urine, stream starts and stops, trouble starting your stream, have to strain to urinate, and being pregnant.  Gastrointestinal (Upper):   Patient denies vomiting, nausea, and indigestion/ heartburn.  Gastrointestinal (Lower):   Patient denies diarrhea and constipation.  Constitutional:   Patient denies fever, night sweats, weight loss, and fatigue.  Skin:   Patient denies skin rash/ lesion and itching.  Eyes:   Patient denies blurred vision and double vision.  Ears/ Nose/ Throat:   Patient denies sore throat and sinus problems.  Hematologic/Lymphatic:   Patient  denies swollen glands and easy bruising.  Cardiovascular:   Patient denies leg swelling and chest pains.  Respiratory:   Patient denies cough and shortness of breath.  Endocrine:   Patient denies excessive thirst.  Musculoskeletal:   Patient denies back pain and joint pain.  Neurological:   Patient denies headaches and dizziness.  Psychologic:   Patient denies depression and anxiety.   Notes: hematuria     VITAL SIGNS:      04/04/2018 03:52 PM  BP 190/93 mmHg  Heart Rate 81 /min  Temperature 97.7 F / 36.5 C   MULTI-SYSTEM PHYSICAL EXAMINATION:    Constitutional: Elderly female in no acute distress  Respiratory: No labored breathing, no use of accessory muscles.   Cardiovascular: Normal temperature, adequate perfusion of extremities  Skin: No paleness, no jaundice  Neurologic / Psychiatric: Oriented to time, oriented to place, oriented to person. No depression, no anxiety, no agitation.  Gastrointestinal: No mass, no tenderness, no rigidity, non obese abdomen.  Eyes: Normal conjunctivae. Normal eyelids.  Musculoskeletal: Normal gait and station of head and neck.     PAST DATA REVIEWED:  Source Of History:  Patient  X-Ray Review: C.T. Abdomen/Pelvis: Reviewed Films. Reviewed Report. Discussed With Patient.     PROCEDURES:         Flexible Cystoscopy - 52000  Risks, benefits, and some of the potential complications of the procedure  were discussed at length with the patient including infection, bleeding, voiding discomfort, urinary retention, fever, chills, sepsis, and others. All questions were answered. Informed consent was obtained. Antibiotic prophylaxis was given. Sterile technique and intraurethral analgesia were used.  Meatus:  Normal size. Normal location. Normal condition.  Urethra:  Normal urethra  Ureteral Orifices:  Orthotopic right ureteral orifice.  Bladder:  Approximately 2-3 cm papillary appearing tumor along the left bladder neck. No obvious tumors elsewhere.       The lower urinary tract was carefully examined. The procedure was well-tolerated and without complications. Antibiotic instructions were given. Instructions were given to call the office immediately for bloody urine, difficulty urinating, urinary retention, painful or frequent urination, fever, chills, nausea, vomiting or other illness. The patient stated that she understood these instructions and would comply with them.         Urinalysis w/Scope Dipstick Dipstick Cont'd Micro  Color: Amber Bilirubin: Neg mg/dL WBC/hpf: 0 - 5/hpf  Appearance: Turbid Ketones: Neg mg/dL RBC/hpf: >60/hpf  Specific Gravity: 1.025 Blood: 3+ ery/uL Bacteria: NS (Not Seen)  pH: <=5.0 Protein: 2+ mg/dL Cystals: NS (Not Seen)  Glucose: Neg mg/dL Urobilinogen: 0.2 mg/dL Casts: NS (Not Seen)    Nitrites: Neg Trichomonas: Not Present    Leukocyte Esterase: Trace leu/uL Mucous: Not Present      Epithelial Cells: NS (Not Seen)      Yeast: NS (Not Seen)      Sperm: Not Present    Notes: MICROSCOPIC DONE ON UNCONCENTRATED URINE    ASSESSMENT:      ICD-10 Details  1 GU:   Bladder tumor/neoplasm - D41.4   2   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 Also plan to perform a right retrograde pyelogram to visualize the entire right distal ureter. Only perform diagnostic ureteroscopy if there is an abnormal finding. She will need cardiology clearance from Dr. [name] to come off blood thinner.   Signed by Link Snuffer, III, M.D. on 04/04/18 at 4:44 PM (EDT

## 2018-05-01 ENCOUNTER — Encounter (HOSPITAL_COMMUNITY): Payer: Self-pay | Admitting: *Deleted

## 2018-05-01 ENCOUNTER — Ambulatory Visit (HOSPITAL_COMMUNITY): Payer: Medicare Other | Admitting: Anesthesiology

## 2018-05-01 ENCOUNTER — Ambulatory Visit (HOSPITAL_COMMUNITY)
Admission: RE | Admit: 2018-05-01 | Discharge: 2018-05-01 | Disposition: A | Discharge status: Home / Self Care | Payer: Medicare Other | Source: Ambulatory Visit | Attending: Urology | Admitting: Urology

## 2018-05-01 ENCOUNTER — Encounter (HOSPITAL_COMMUNITY)
Admission: RE | Disposition: A | Discharge status: Home / Self Care | Payer: Self-pay | Source: Ambulatory Visit | Attending: Urology

## 2018-05-01 ENCOUNTER — Ambulatory Visit (HOSPITAL_COMMUNITY): Payer: Medicare Other

## 2018-05-01 DIAGNOSIS — I739 Peripheral vascular disease, unspecified: Secondary | ICD-10-CM | POA: Insufficient documentation

## 2018-05-01 DIAGNOSIS — Z85028 Personal history of other malignant neoplasm of stomach: Secondary | ICD-10-CM | POA: Insufficient documentation

## 2018-05-01 DIAGNOSIS — K219 Gastro-esophageal reflux disease without esophagitis: Secondary | ICD-10-CM | POA: Diagnosis not present

## 2018-05-01 DIAGNOSIS — Z79890 Hormone replacement therapy (postmenopausal): Secondary | ICD-10-CM | POA: Diagnosis not present

## 2018-05-01 DIAGNOSIS — Z906 Acquired absence of other parts of urinary tract: Secondary | ICD-10-CM | POA: Insufficient documentation

## 2018-05-01 DIAGNOSIS — C679 Malignant neoplasm of bladder, unspecified: Secondary | ICD-10-CM | POA: Insufficient documentation

## 2018-05-01 DIAGNOSIS — Z8554 Personal history of malignant neoplasm of ureter: Secondary | ICD-10-CM | POA: Diagnosis not present

## 2018-05-01 DIAGNOSIS — Z950 Presence of cardiac pacemaker: Secondary | ICD-10-CM | POA: Insufficient documentation

## 2018-05-01 DIAGNOSIS — N183 Chronic kidney disease, stage 3 (moderate): Secondary | ICD-10-CM | POA: Diagnosis not present

## 2018-05-01 DIAGNOSIS — I129 Hypertensive chronic kidney disease with stage 1 through stage 4 chronic kidney disease, or unspecified chronic kidney disease: Secondary | ICD-10-CM | POA: Diagnosis not present

## 2018-05-01 DIAGNOSIS — I10 Essential (primary) hypertension: Secondary | ICD-10-CM | POA: Insufficient documentation

## 2018-05-01 DIAGNOSIS — Z85528 Personal history of other malignant neoplasm of kidney: Secondary | ICD-10-CM | POA: Diagnosis not present

## 2018-05-01 DIAGNOSIS — Z79899 Other long term (current) drug therapy: Secondary | ICD-10-CM | POA: Diagnosis not present

## 2018-05-01 DIAGNOSIS — Z905 Acquired absence of kidney: Secondary | ICD-10-CM | POA: Insufficient documentation

## 2018-05-01 DIAGNOSIS — Z7901 Long term (current) use of anticoagulants: Secondary | ICD-10-CM | POA: Diagnosis not present

## 2018-05-01 DIAGNOSIS — D494 Neoplasm of unspecified behavior of bladder: Secondary | ICD-10-CM | POA: Diagnosis not present

## 2018-05-01 DIAGNOSIS — Z96659 Presence of unspecified artificial knee joint: Secondary | ICD-10-CM | POA: Diagnosis not present

## 2018-05-01 DIAGNOSIS — E039 Hypothyroidism, unspecified: Secondary | ICD-10-CM | POA: Insufficient documentation

## 2018-05-01 DIAGNOSIS — R310 Gross hematuria: Secondary | ICD-10-CM | POA: Diagnosis present

## 2018-05-01 HISTORY — PX: TRANSURETHRAL RESECTION OF BLADDER TUMOR WITH MITOMYCIN-C: SHX6459

## 2018-05-01 HISTORY — PX: CYSTOSCOPY W/ RETROGRADES: SHX1426

## 2018-05-01 SURGERY — TRANSURETHRAL RESECTION OF BLADDER TUMOR WITH MITOMYCIN-C
Anesthesia: General | Laterality: Right

## 2018-05-01 MED ORDER — ONDANSETRON HCL 4 MG/2ML IJ SOLN
INTRAMUSCULAR | Status: AC
  Filled 2018-05-01: qty 2

## 2018-05-01 MED ORDER — SODIUM CHLORIDE 0.9 % IR SOLN
Status: DC | PRN
  Administered 2018-05-01: 6000 mL via INTRAVESICAL

## 2018-05-01 MED ORDER — SUGAMMADEX SODIUM 200 MG/2ML IV SOLN
INTRAVENOUS | Status: DC | PRN
  Administered 2018-05-01: 200 mg via INTRAVENOUS

## 2018-05-01 MED ORDER — FENTANYL CITRATE (PF) 100 MCG/2ML IJ SOLN: 25.0000 ug | INTRAMUSCULAR | Status: DC | PRN

## 2018-05-01 MED ORDER — ROCURONIUM BROMIDE 100 MG/10ML IV SOLN
INTRAVENOUS | Status: DC | PRN
  Administered 2018-05-01: 30 mg via INTRAVENOUS

## 2018-05-01 MED ORDER — SUGAMMADEX SODIUM 200 MG/2ML IV SOLN
INTRAVENOUS | Status: AC
  Filled 2018-05-01: qty 2

## 2018-05-01 MED ORDER — IOHEXOL 300 MG/ML  SOLN
INTRAMUSCULAR | Status: DC | PRN
  Administered 2018-05-01: 10 mL via URETHRAL

## 2018-05-01 MED ORDER — GEMCITABINE CHEMO FOR BLADDER INSTILLATION 2000 MG
2000.0000 mg | Freq: Once | INTRAVENOUS | Status: AC
  Administered 2018-05-01: 2000 mg via INTRAVESICAL
  Filled 2018-05-01: qty 2000

## 2018-05-01 MED ORDER — LACTATED RINGERS IV SOLN
INTRAVENOUS | Status: DC
  Administered 2018-05-01: 08:00:00 via INTRAVENOUS

## 2018-05-01 MED ORDER — FENTANYL CITRATE (PF) 100 MCG/2ML IJ SOLN
INTRAMUSCULAR | Status: AC
  Filled 2018-05-01: qty 2

## 2018-05-01 MED ORDER — TRAMADOL HCL 50 MG PO TABS: 50.0000 mg | ORAL_TABLET | Freq: Four times a day (QID) | ORAL | 0 refills | Status: DC | PRN

## 2018-05-01 MED ORDER — ONDANSETRON HCL 4 MG/2ML IJ SOLN
INTRAMUSCULAR | Status: DC | PRN
  Administered 2018-05-01: 4 mg via INTRAVENOUS

## 2018-05-01 MED ORDER — PROMETHAZINE HCL 25 MG/ML IJ SOLN: 6.2500 mg | INTRAMUSCULAR | Status: DC | PRN

## 2018-05-01 MED ORDER — CEFAZOLIN SODIUM-DEXTROSE 2-4 GM/100ML-% IV SOLN
2.0000 g | INTRAVENOUS | Status: AC
  Administered 2018-05-01: 2 g via INTRAVENOUS
  Filled 2018-05-01: qty 100

## 2018-05-01 MED ORDER — PROPOFOL 10 MG/ML IV BOLUS
INTRAVENOUS | Status: AC
  Filled 2018-05-01: qty 20

## 2018-05-01 MED ORDER — ROCURONIUM BROMIDE 10 MG/ML (PF) SYRINGE
PREFILLED_SYRINGE | INTRAVENOUS | Status: AC
  Filled 2018-05-01: qty 10

## 2018-05-01 MED ORDER — DEXAMETHASONE SODIUM PHOSPHATE 10 MG/ML IJ SOLN
INTRAMUSCULAR | Status: DC | PRN
  Administered 2018-05-01: 10 mg via INTRAVENOUS

## 2018-05-01 MED ORDER — EPHEDRINE SULFATE 50 MG/ML IJ SOLN
INTRAMUSCULAR | Status: DC | PRN
  Administered 2018-05-01: 10 mg via INTRAVENOUS

## 2018-05-01 MED ORDER — FENTANYL CITRATE (PF) 100 MCG/2ML IJ SOLN
INTRAMUSCULAR | Status: DC | PRN
  Administered 2018-05-01 (×2): 50 ug via INTRAVENOUS

## 2018-05-01 MED ORDER — EPHEDRINE 5 MG/ML INJ
INTRAVENOUS | Status: AC
  Filled 2018-05-01: qty 10

## 2018-05-01 MED ORDER — SUCCINYLCHOLINE CHLORIDE 200 MG/10ML IV SOSY
PREFILLED_SYRINGE | INTRAVENOUS | Status: AC
  Filled 2018-05-01: qty 10

## 2018-05-01 MED ORDER — SUCCINYLCHOLINE CHLORIDE 20 MG/ML IJ SOLN
INTRAMUSCULAR | Status: DC | PRN
  Administered 2018-05-01: 80 mg via INTRAVENOUS

## 2018-05-01 MED ORDER — LIDOCAINE HCL (CARDIAC) PF 100 MG/5ML IV SOSY
PREFILLED_SYRINGE | INTRAVENOUS | Status: DC | PRN
  Administered 2018-05-01: 50 mg via INTRAVENOUS

## 2018-05-01 MED ORDER — PROPOFOL 10 MG/ML IV BOLUS
INTRAVENOUS | Status: DC | PRN
  Administered 2018-05-01: 120 mg via INTRAVENOUS

## 2018-05-01 MED ORDER — LIDOCAINE 2% (20 MG/ML) 5 ML SYRINGE
INTRAMUSCULAR | Status: AC
  Filled 2018-05-01: qty 5

## 2018-05-01 SURGICAL SUPPLY — 15 items
BAG URINE DRAINAGE (UROLOGICAL SUPPLIES) ×4 IMPLANT
BAG URO CATCHER STRL LF (MISCELLANEOUS) ×4 IMPLANT
CATH FOLEY 2WAY SLVR  5CC 18FR (CATHETERS) ×2
CATH FOLEY 2WAY SLVR 5CC 18FR (CATHETERS) ×2 IMPLANT
CATH INTERMIT  6FR 70CM (CATHETERS) ×4 IMPLANT
CLOTH BEACON ORANGE TIMEOUT ST (SAFETY) IMPLANT
GLOVE BIO SURGEON STRL SZ7.5 (GLOVE) ×4 IMPLANT
GOWN STRL REUS W/TWL LRG LVL3 (GOWN DISPOSABLE) IMPLANT
GOWN STRL REUS W/TWL XL LVL3 (GOWN DISPOSABLE) ×4 IMPLANT
GUIDEWIRE STR DUAL SENSOR (WIRE) ×4 IMPLANT
MANIFOLD NEPTUNE II (INSTRUMENTS) ×4 IMPLANT
PACK CYSTO (CUSTOM PROCEDURE TRAY) ×4 IMPLANT
TUBING CONNECTING 10 (TUBING) ×3 IMPLANT
TUBING CONNECTING 10' (TUBING) ×1
WATER STERILE IRR 500ML POUR (IV SOLUTION) ×4 IMPLANT

## 2018-05-01 NOTE — Anesthesia Preprocedure Evaluation (Signed)
Anesthesia Evaluation  Patient identified by MRN, date of birth, ID band Patient awake    Reviewed: Allergy & Precautions, NPO status , Patient's Chart, lab work & pertinent test results  History of Anesthesia Complications (+) PONV  Airway Mallampati: II  TM Distance: >3 FB Neck ROM: Full    Dental no notable dental hx.    Pulmonary neg pulmonary ROS,    Pulmonary exam normal breath sounds clear to auscultation       Cardiovascular hypertension, Pt. on medications and Pt. on home beta blockers + Peripheral Vascular Disease  Normal cardiovascular exam+ pacemaker + Valvular Problems/Murmurs AS  Rhythm:Regular Rate:Normal     Neuro/Psych negative neurological ROS  negative psych ROS   GI/Hepatic Neg liver ROS, GERD  ,gastroparesis   Endo/Other  Hypothyroidism   Renal/GU negative Renal ROS  negative genitourinary   Musculoskeletal negative musculoskeletal ROS (+)   Abdominal   Peds negative pediatric ROS (+)  Hematology On coumadin   Anesthesia Other Findings   Reproductive/Obstetrics negative OB ROS                             Anesthesia Physical Anesthesia Plan  ASA: III  Anesthesia Plan: General   Post-op Pain Management:    Induction: Intravenous  PONV Risk Score and Plan: 4 or greater and Ondansetron, Dexamethasone and Treatment may vary due to age or medical condition  Airway Management Planned: Oral ETT  Additional Equipment:   Intra-op Plan:   Post-operative Plan: Extubation in OR  Informed Consent: I have reviewed the patients History and Physical, chart, labs and discussed the procedure including the risks, benefits and alternatives for the proposed anesthesia with the patient or authorized representative who has indicated his/her understanding and acceptance.   Dental advisory given  Plan Discussed with: CRNA and Surgeon  Anesthesia Plan Comments:          Anesthesia Quick Evaluation

## 2018-05-01 NOTE — Anesthesia Procedure Notes (Signed)
Procedure Name: Intubation Date/Time: 05/01/2018 8:51 AM Performed by: Colin Rhein, CRNA Pre-anesthesia Checklist: Patient identified, Suction available and Patient being monitored Patient Re-evaluated:Patient Re-evaluated prior to induction Oxygen Delivery Method: Circle system utilized Preoxygenation: Pre-oxygenation with 100% oxygen Induction Type: IV induction Ventilation: Mask ventilation without difficulty Laryngoscope Size: Mac and 3 Grade View: Grade I Tube type: Oral Tube size: 7.0 mm Number of attempts: 1 Airway Equipment and Method: Patient positioned with wedge pillow Placement Confirmation: ETT inserted through vocal cords under direct vision,  positive ETCO2,  CO2 detector and breath sounds checked- equal and bilateral Secured at: 22 cm Tube secured with: Tape Dental Injury: Teeth and Oropharynx as per pre-operative assessment

## 2018-05-01 NOTE — Op Note (Signed)
Operative Note  Preoperative diagnosis:  1.  Bladder tumor  Postoperative diagnosis: 1.  Bladder tumor--medium--2 cm  Procedure(s): 1.  Transurethral resection of bladder tumor--medium 2.  Cystoscopy with right retrograde pyelogram 3.  Instillation of intravesical chemotherapy--gemcitabine  Surgeon: Link Snuffer, MD  Assistants: None  Anesthesia: General  Complications: None immediate  EBL: Minimal  Specimens: 1.  Bladder tumor  Drains/Catheters: 1.  18 French urethral catheter  Intraoperative findings: 1.  Normal urethra 2.  Right retrograde pyelogram revealed a well opacified ureter and kidney with no evidence of filling defect. 3.  Proximal a 2 cm bladder tumor at the left bladder neck appeared to be completely resected.  No other mass lesions.  Indication: 84 year old female status post left nephro ureterectomy in the past recently had some hematuria and was found to have a bladder tumor.  She presents for the previously mentioned operation.  Description of procedure:  The patient was identified and consent was obtained.  The patient was taken to the operating room and placed in the supine position.  The patient was placed under general anesthesia.  Perioperative antibiotics were administered.  The patient was placed in dorsal lithotomy.  Patient was prepped and draped in a standard sterile fashion and a timeout was performed.  A 21 French rigid cystoscope was advanced into the urethra and into the bladder.  Complete cystoscopy was performed with findings noted above.  The right ureteral orifice was cannulated with an open-ended ureteral catheter and a retrograde pyelogram was performed with findings noted above.  A 26 French resectoscope was then advanced into the bladder with the visual obturator and placed which was exchanged for the working element.  On bipolar settings, the bladder tumor of interest was resected.  The tumor bed as well as the surrounding mucosa was  fulgurated.  All bladder chips were collected for permanent pathology.  The scope was then withdrawn and a 18 French urethral catheter was placed.  Patient tolerated the procedure well and was stable postoperatively.  In the PACU, gemcitabine was instilled into the bladder and remained for 2 hours.  Plan: Return in 1 to 2 weeks for pathology review.

## 2018-05-01 NOTE — Progress Notes (Addendum)
Dr Gloriann Loan instilling medication:  Orders instillation for 2 hours, and remove discharge prior to discharge. Remove foley prior to discharge

## 2018-05-01 NOTE — Transfer of Care (Signed)
Immediate Anesthesia Transfer of Care Note  Patient: [name]  Procedure(s) Performed: TRANSURETHRAL RESECTION OF BLADDER TUMOR WITH POST OP INSTILLATION OF GEMCITABINE (N/A ) CYSTOSCOPY WITH RIGHT RETROGRADE PYELOGRAM (Right )  Patient Location: PACU  Anesthesia Type:General  Level of Consciousness: drowsy  Airway & Oxygen Therapy: Patient connected to face mask  Post-op Assessment: Report given to RN and Post -op Vital signs reviewed and stable  Post vital signs: Reviewed and stable  Last Vitals:  Vitals Value Taken Time  BP 179/66 05/01/2018  9:30 AM  Temp    Pulse 61 05/01/2018  9:32 AM  Resp 8 05/01/2018  9:32 AM  SpO2 100 % 05/01/2018  9:32 AM  Vitals shown include unvalidated device data.  Last Pain:  Vitals:   05/01/18 0731  TempSrc:   PainSc: 0-No pain         Complications: No apparent anesthesia complications

## 2018-05-01 NOTE — Interval H&P Note (Signed)
History and Physical Interval Note:  05/01/2018 8:30 AM  [name]  has presented today for surgery, with the diagnosis of BLADDER TUMOR  The various methods of treatment have been discussed with the patient and family. After consideration of risks, benefits and other options for treatment, the patient has consented to  Procedure(s): TRANSURETHRAL RESECTION OF BLADDER TUMOR WITH GEMCITABINE (N/A) CYSTOSCOPY WITH RIGHT RETROGRADE PYELOGRAM (Right) as a surgical intervention .  The patient's history has been reviewed, patient examined, no change in status, stable for surgery.  I have reviewed the patient's chart and labs.  Questions were answered to the patient's satisfaction.     Marton Redwood, III

## 2018-05-01 NOTE — Discharge Instructions (Addendum)
Transurethral Resection of Bladder Tumor (TURBT) or Bladder Biopsy ° ° °Definition: ° Transurethral Resection of the Bladder Tumor is a surgical procedure used to diagnose and remove tumors within the bladder. TURBT is the most common treatment for early stage bladder cancer. ° °General instructions: °   ° Your recent bladder surgery requires very little post hospital care but some definite precautions. ° °Despite the fact that no skin incisions were used, the area around the bladder incisions are raw and covered with scabs to promote healing and prevent bleeding. Certain precautions are needed to insure that the scabs are not disturbed over the next 2-4 weeks while the healing proceeds. ° °Because the raw surface inside your bladder and the irritating effects of urine you may expect frequency of urination and/or urgency (a stronger desire to urinate) and perhaps even getting up at night more often. This will usually resolve or improve slowly over the healing period. You may see some blood in your urine over the first 6 weeks. Do not be alarmed, even if the urine was clear for a while. Get off your feet and drink lots of fluids until clearing occurs. If you start to pass clots or don't improve call us. ° °Diet: ° °You may return to your normal diet immediately. Because of the raw surface of your bladder, alcohol, spicy foods, foods high in acid and drinks with caffeine may cause irritation or frequency and should be used in moderation. To keep your urine flowing freely and avoid constipation, drink plenty of fluids during the day (8-10 glasses). Tip: Avoid cranberry juice because it is very acidic. ° °Activity: ° °Your physical activity doesn't need to be restricted. However, if you are very active, you may see some blood in the urine. We suggest that you reduce your activity under the circumstances until the bleeding has stopped. ° °Bowels: ° °It is important to keep your bowels regular during the postoperative  period. Straining with bowel movements can cause bleeding. A bowel movement every other day is reasonable. Use a mild laxative if needed, such as milk of magnesia 2-3 tablespoons, or 2 Dulcolax tablets. Call if you continue to have problems. If you had been taking narcotics for pain, before, during or after your surgery, you may be constipated. Take a laxative if necessary. ° ° ° °Medication: ° °You should resume your pre-surgery medications unless told not to. In addition you may be given an antibiotic to prevent or treat infection. Antibiotics are not always necessary. All medication should be taken as prescribed until the bottles are finished unless you are having an unusual reaction to one of the drugs. ° ° ° °General Anesthesia, Adult, Care After °These instructions provide you with information about caring for yourself after your procedure. Your health care provider may also give you more specific instructions. Your treatment has been planned according to current medical practices, but problems sometimes occur. Call your health care provider if you have any problems or questions after your procedure. °What can I expect after the procedure? °After the procedure, it is common to have: °· Vomiting. °· A sore throat. °· Mental slowness. ° °It is common to feel: °· Nauseous. °· Cold or shivery. °· Sleepy. °· Tired. °· Sore or achy, even in parts of your body where you did not have surgery. ° °Follow these instructions at home: °For at least 24 hours after the procedure: °· Do not: °? Participate in activities where you could fall or become injured. °? Drive. °?   Use heavy machinery. °? Drink alcohol. °? Take sleeping pills or medicines that cause drowsiness. °? Make important decisions or sign legal documents. °? Take care of children on your own. °· Rest. °Eating and drinking °· If you vomit, drink water, juice, or soup when you can drink without vomiting. °· Drink enough fluid to keep your urine clear or pale  yellow. °· Make sure you have little or no nausea before eating solid foods. °· Follow the diet recommended by your health care provider. °General instructions °· Have a responsible adult stay with you until you are awake and alert. °· Return to your normal activities as told by your health care provider. Ask your health care provider what activities are safe for you. °· Take over-the-counter and prescription medicines only as told by your health care provider. °· If you smoke, do not smoke without supervision. °· Keep all follow-up visits as told by your health care provider. This is important. °Contact a health care provider if: °· You continue to have nausea or vomiting at home, and medicines are not helpful. °· You cannot drink fluids or start eating again. °· You cannot urinate after 8-12 hours. °· You develop a skin rash. °· You have fever. °· You have increasing redness at the site of your procedure. °Get help right away if: °· You have difficulty breathing. °· You have chest pain. °· You have unexpected bleeding. °· You feel that you are having a life-threatening or urgent problem. °This information is not intended to replace advice given to you by your health care provider. Make sure you discuss any questions you have with your health care provider. °Document Released: 11/29/2000 Document Revised: 01/26/2016 Document Reviewed: 08/07/2015 °[employer] Interactive Patient Education © 2018 [employer] ° °

## 2018-05-01 NOTE — Anesthesia Postprocedure Evaluation (Signed)
Anesthesia Post Note  Patient: AIRANNA PARTIN  Procedure(s) Performed: TRANSURETHRAL RESECTION OF BLADDER TUMOR WITH POST OP INSTILLATION OF GEMCITABINE (N/A ) CYSTOSCOPY WITH RIGHT RETROGRADE PYELOGRAM (Right )     Patient location during evaluation: PACU Anesthesia Type: General Level of consciousness: awake and alert Pain management: pain level controlled Vital Signs Assessment: post-procedure vital signs reviewed and stable Respiratory status: spontaneous breathing, nonlabored ventilation, respiratory function stable and patient connected to nasal cannula oxygen Cardiovascular status: blood pressure returned to baseline and stable Postop Assessment: no apparent nausea or vomiting Anesthetic complications: no    Last Vitals:  Vitals:   05/01/18 1145 05/01/18 1200  BP: 138/64 (!) 148/67  Pulse: 63 69  Resp: 14 (!) 24  Temp:  36.7 C  SpO2: 97% 98%    Last Pain:  Vitals:   05/01/18 1200  TempSrc:   PainSc: 0-No pain                 Norvel Wenker S

## 2018-05-02 ENCOUNTER — Encounter (HOSPITAL_COMMUNITY): Payer: Self-pay | Admitting: Urology

## 2018-05-05 ENCOUNTER — Ambulatory Visit (INDEPENDENT_AMBULATORY_CARE_PROVIDER_SITE_OTHER): Payer: Medicare Other

## 2018-05-05 DIAGNOSIS — I4891 Unspecified atrial fibrillation: Secondary | ICD-10-CM

## 2018-05-05 DIAGNOSIS — Z7901 Long term (current) use of anticoagulants: Secondary | ICD-10-CM | POA: Diagnosis not present

## 2018-05-05 LAB — POCT INR: INR: 1.6 — AB (ref 2.0–3.0)

## 2018-05-05 NOTE — Patient Instructions (Addendum)
Description   Take 2 tablets today and tomorrow then continue taking 1 tablet daily except 1.5 tablets on Mondays and Fridays. Take Lovenox through Sunday. Recheck INR on Tuesday. Call us with any medication changes or concerns # [phone].

## 2018-05-09 ENCOUNTER — Ambulatory Visit: Payer: Medicare Other | Admitting: *Deleted

## 2018-05-09 DIAGNOSIS — Z7901 Long term (current) use of anticoagulants: Secondary | ICD-10-CM

## 2018-05-09 DIAGNOSIS — I4891 Unspecified atrial fibrillation: Secondary | ICD-10-CM | POA: Diagnosis not present

## 2018-05-09 LAB — POCT INR: INR: 2.2 (ref 2.0–3.0)

## 2018-05-09 NOTE — Patient Instructions (Signed)
Description   Continue taking 1 tablet daily except 1.5 tablets on Mondays and Fridays.  Recheck INR in 2 weeks. Call us with any medication changes or concerns # [phone].

## 2018-05-10 DIAGNOSIS — C672 Malignant neoplasm of lateral wall of bladder: Secondary | ICD-10-CM | POA: Diagnosis not present

## 2018-05-11 ENCOUNTER — Encounter: Payer: Self-pay | Admitting: Adult Health

## 2018-05-11 ENCOUNTER — Telehealth: Payer: Self-pay | Admitting: Oncology

## 2018-05-11 ENCOUNTER — Encounter: Payer: Self-pay | Admitting: Oncology

## 2018-05-11 DIAGNOSIS — C679 Malignant neoplasm of bladder, unspecified: Secondary | ICD-10-CM | POA: Insufficient documentation

## 2018-05-11 NOTE — Telephone Encounter (Signed)
New referral received from Dr. Gloriann Loan at [facility] for bladder cancer. I cld and spoke to the pt's daughter, [name], and scheduled the pt to see Dr. Alen Blew on 9/17 at 11am. She's aware her mother should arrive 30 minutes early so that she can be checked in on time. Letter mailed.

## 2018-05-14 ENCOUNTER — Other Ambulatory Visit: Payer: Self-pay | Admitting: Internal Medicine

## 2018-05-22 ENCOUNTER — Ambulatory Visit: Payer: Medicare Other

## 2018-05-22 ENCOUNTER — Other Ambulatory Visit: Payer: Self-pay | Admitting: Internal Medicine

## 2018-05-22 DIAGNOSIS — Z7901 Long term (current) use of anticoagulants: Secondary | ICD-10-CM | POA: Diagnosis not present

## 2018-05-22 DIAGNOSIS — I4891 Unspecified atrial fibrillation: Secondary | ICD-10-CM

## 2018-05-22 LAB — POCT INR: INR: 1.6 — AB (ref 2.0–3.0)

## 2018-05-22 NOTE — Patient Instructions (Signed)
Description   Take 2 tablets today, then resume same dosage 1 tablet daily except 1.5 tablets on Mondays and Fridays.  Recheck INR in 2 weeks. Call us with any medication changes or concerns # [phone].

## 2018-05-23 ENCOUNTER — Telehealth: Payer: Self-pay | Admitting: Oncology

## 2018-05-23 ENCOUNTER — Inpatient Hospital Stay: Payer: Medicare Other | Attending: Oncology | Admitting: Oncology

## 2018-05-23 VITALS — BP 183/75 | HR 81 | Temp 97.7°F | Resp 18 | Ht 59.0 in | Wt 98.2 lb

## 2018-05-23 DIAGNOSIS — Z85028 Personal history of other malignant neoplasm of stomach: Secondary | ICD-10-CM | POA: Diagnosis not present

## 2018-05-23 DIAGNOSIS — Z90710 Acquired absence of both cervix and uterus: Secondary | ICD-10-CM

## 2018-05-23 DIAGNOSIS — C679 Malignant neoplasm of bladder, unspecified: Secondary | ICD-10-CM

## 2018-05-23 DIAGNOSIS — Z923 Personal history of irradiation: Secondary | ICD-10-CM | POA: Diagnosis not present

## 2018-05-23 DIAGNOSIS — Z8542 Personal history of malignant neoplasm of other parts of uterus: Secondary | ICD-10-CM

## 2018-05-23 NOTE — Progress Notes (Signed)
Reason for the request:    Bladder cancer  HPI: I was asked by Dr. Gloriann Loan to evaluate [name] for recent diagnosis of bladder cancer.  He is a pleasant 84 year old woman with history of urothelial malignancy and underwent a nephro ureterectomy completed in 2003.  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  Been followed periodically by urology and establish care with Dr. Gloriann Loan with symptoms of stress incontinence and bladder overactivity.  She started developing intermittent hematuria and underwent a cystoscopy on 05/01/2018 showed a 2 cm bladder tumor and underwent a trans-ureteral resection completed at that time without any incident.  The final pathology revealed a poorly differentiated carcinoma indicating urothelial carcinoma with muscle involvement.  CT scan of the chest abdomen and pelvis obtained on 03/21/2018 showed no evidence of metastatic disease.  She has also history of stroke cancer after presenting with upper GI bleed in 2017 and underwent gastric resection in 2017 by [name].  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  She also has remote history of endometrial cancer and has received hysterectomy and radiation under the care of Dr. Donella Stade in in the 1980s.  Clinically, she reports no complaints at this time.  She does report some occasional nausea and does take antinausea medication but no other concerns.  She is no longer reporting any hematuria or dysuria.  She remains reasonably active and attends to activities of daily living.  Shedoes not report any headaches, blurry vision, syncope or seizures. Does not report any fevers, chills or sweats.  Does not report any cough, wheezing or hemoptysis.  Does not report any chest pain, palpitation, orthopnea or leg edema.  Does not report any  vomiting or abdominal pain.  Does not report  any constipation or diarrhea.  Does not report any skeletal complaints.    Does not report frequency, urgency or hematuria.  Does not report any skin rashes or lesions. Does not report any heat or cold intolerance.  Does not report any lymphadenopathy or petechiae.  Does not report any anxiety or depression.  Remaining review of systems is negative.    Past Medical History:  Diagnosis Date  . Adenocarcinoma of stomach ([place]) 11/11/2015   gastric mass on egd  . Anal fissure   . Anemia    hx 3/17 iron infusion, on aranesp and injected B12 since 11/2015.   [name] Anxiety   . Aortic root dilatation ([place])   . Arthritis   . 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 CAD (coronary artery disease)   . Chronic kidney disease    only has one kidney rt lft rem 03 ca     dr. Risa Grill  . Elevated LFTs    hepatic steatosis  . Gastroparesis   . GERD (gastroesophageal reflux disease)   . History of uterine cancer 1980s   treated with hysterectomy, external radiation and radiation seed implants.   [name] HTN (hypertension)   . Hyperlipemia   . Hypothyroidism   . Iron deficiency anemia due to chronic blood loss 11/24/2015  . Moderate aortic stenosis   . Neuropathy    feet/legs  . No blood products 02/27/2016  . Osteomyelitis ([place]) as child  . PAF (paroxysmal atrial fibrillation) ([place])   . PONV (postoperative nausea and vomiting)    long time ago  . Radiation proctitis   . Restless leg syndrome   .  Stroke ([place]) 15   partial stroke left legally blind; left eye   . Tachycardia-bradycardia syndrome ([place])    s/p PPM by Dr Doreatha Lew (MDT) 07/03/10  :  Past Surgical History:  Procedure Laterality Date  . CARDIAC CATHETERIZATION  2008  . CHOLECYSTECTOMY  1970s  . COLONOSCOPY N/A 11/11/2015   Procedure: COLONOSCOPY;  Surgeon: [name], MD;  Location: [facility];  Service: Endoscopy;  Laterality: N/A;  . CYSTOSCOPY W/ RETROGRADES Right 05/01/2018   Procedure: CYSTOSCOPY WITH RIGHT RETROGRADE PYELOGRAM;  Surgeon: Lucas Mallow, MD;  Location: [facility];  Service: Urology;  Laterality: Right;  . ESOPHAGOGASTRODUODENOSCOPY N/A 11/11/2015   Procedure: ESOPHAGOGASTRODUODENOSCOPY (EGD);  Surgeon: [name], MD;  Location: [facility];  Service: Endoscopy;  Laterality: N/A;  . ESOPHAGOGASTRODUODENOSCOPY N/A 02/05/2016   Procedure: ESOPHAGOGASTRODUODENOSCOPY (EGD);  Surgeon: Ladene Artist, MD;  Location: [facility];  Service: Endoscopy;  Laterality: N/A;  . ESOPHAGOGASTRODUODENOSCOPY N/A 04/02/2016   Procedure: ESOPHAGOGASTRODUODENOSCOPY (EGD);  Surgeon: [name], MD;  Location: [facility];  Service: Endoscopy;  Laterality: N/A;  . ESOPHAGOGASTRODUODENOSCOPY (EGD) WITH PROPOFOL N/A 04/06/2017   Procedure: ESOPHAGOGASTRODUODENOSCOPY (EGD) WITH PROPOFOL;  Surgeon: [name], MD;  [facility];  Service: Endoscopy;  Laterality: N/A;  . GASTRECTOMY N/A 01/15/2016   Procedure: OPEN PARTIAL GASTRECTOMY;  Surgeon: Stark Klein, MD;  Location: [place];  Service: General;  Laterality: N/A;  . GASTRECTOMY Left 02/24/2016   Procedure: OPEN J TUBE;  Surgeon: Stark Klein, MD;  Location: [place];  Service: General;  Laterality: Left;  [name] GASTROJEJUNOSTOMY N/A 05/18/2016   Procedure: ROUX EN Y GASTROJEJUNOSTOMY;  Surgeon: Stark Klein, MD;  Location: [place];  Service: General;  Laterality: N/A;  . I&D KNEE WITH POLY EXCHANGE Right 12/17/2013   Procedure: IRRIGATION AND DEBRIDEMEN RIGHT TOTAL  KNEE WITH POLY EXCHANGE;  Surgeon: Mauri Pole, MD;  [facility];  Service: Orthopedics;  Laterality: Right;  . IR GENERIC HISTORICAL  06/17/2016   IR RADIOLOGIST EVAL & MGMT 06/17/2016 Ascencion Dike, PA-C [facility] INTERV RAD  . LAPAROSCOPY N/A 01/15/2016   Procedure: LAPAROSCOPY DIAGNOSTIC;  Surgeon: Stark Klein, MD;  Location: [place];  Service: General;  Laterality: N/A;  . LYSIS OF ADHESION  05/18/2016   Procedure: LYSIS OF ADHESION;  Surgeon: Stark Klein, MD;  Location: [place];  Service: General;;  . NEPHRECTOMY Left     renal cell cancer  . PACEMAKER INSERTION  07/04/2011   MDT PPM implanted by Dr Doreatha Lew for tachy/brady; managed by Dr Thompson Grayer   . PATELLAR TENDON REPAIR Right 03/06/2014   Procedure: AVULSION WITH PATELLA TENDON REPAIR;  Surgeon: Mauri Pole, MD;  [facility];  Service: Orthopedics;  Laterality: Right;  . PATENT DUCTUS ARTERIOUS REPAIR  ~ 1949  . TOTAL ABDOMINAL HYSTERECTOMY  85  . TOTAL HIP REVISION Right 2009   initial replacment surg  . TOTAL KNEE ARTHROPLASTY  02/07/2012   Procedure: TOTAL KNEE ARTHROPLASTY;  Surgeon: Mauri Pole, MD;  [facility];  Service: Orthopedics;  Laterality: Right;  . TOTAL KNEE REVISION Right 07/29/2014   Procedure: RIGHT KNEE REVISION OF PREVIOUS REPAIR EXTENSOR MECHANISM;  Surgeon: Mauri Pole, MD;  [facility];  Service: Orthopedics;  Laterality: Right;  . TRANSURETHRAL RESECTION OF BLADDER TUMOR WITH MITOMYCIN-C N/A 05/01/2018   Procedure: TRANSURETHRAL RESECTION OF BLADDER TUMOR WITH POST OP INSTILLATION OF GEMCITABINE;  Surgeon: Lucas Mallow, MD;  [facility];  Service: Urology;  Laterality:  N/A;  . WHIPPLE PROCEDURE N/A 05/18/2016   Procedure: EXPLORATORY LAPAROTOMY;  Surgeon: Stark Klein, MD;  Location: [place];  Service: General;  Laterality: N/A;  :   Current Outpatient Medications:  .  acetaminophen (TYLENOL) 500 MG tablet, Take 500 mg by mouth every 6 (six) hours as needed for mild pain., Disp: , Rfl:  .  ALPRAZolam (XANAX) 0.5 MG tablet, TAKE 1/2-1 TABLET BY MOUTH 3 TIMES A DAY AS NEEDED FOR ANXIETY (Patient taking differently: Take 0.25 mg by mouth at bedtime as needed (for sleep). ), Disp: 90 tablet, Rfl: 0 .  Biotin 1000 MCG tablet, Take 1,000 mcg by mouth daily., Disp: , Rfl:  .  Cholecalciferol (VITAMIN D-3) 5000 units TABS, Take 5,000 Units by mouth daily., Disp: , Rfl:  .  diltiazem (CARDIZEM CD) 180 MG 24 hr capsule, TAKE 1 CAPSULE BY MOUTH EVERY DAY, Disp: 90 capsule, Rfl: 1 .  enoxaparin (LOVENOX) 40  MG/0.4ML injection, Inject 0.4 mLs (40 mg total) into the skin daily., Disp: 10 Syringe, Rfl: 1 .  furosemide (LASIX) 20 MG tablet, TAKE 1 TABLET BY MOUTH EVERY DAY (Patient taking differently: Take 20 mg by mouth every other day. ), Disp: 90 tablet, Rfl: 1 .  gabapentin (NEURONTIN) 300 MG capsule, Take 300 mg by mouth 2 (two) times daily., Disp: , Rfl:  .  levothyroxine (SYNTHROID, LEVOTHROID) 100 MCG tablet, Take 50 mcg by mouth daily before breakfast., Disp: , Rfl:  .  Magnesium 500 MG TABS, Take 500 mg by mouth every evening., Disp: , Rfl:  .  metoprolol tartrate (LOPRESSOR) 100 MG tablet, Take 1 tablet (100 mg total) by mouth 2 (two) times daily. (Patient taking differently: Take 50 mg by mouth 2 (two) times daily. ), Disp: 180 tablet, Rfl: 1 .  nitroGLYCERIN (NITROSTAT) 0.4 MG SL tablet, Place 1 tablet (0.4 mg total) under the tongue every 5 (five) minutes as needed for chest pain (MAX 3 TABLETS)., Disp: 25 tablet, Rfl: prn .  ondansetron (ZOFRAN-ODT) 4 MG disintegrating tablet, Take 8 mg by mouth every 8 (eight) hours as needed for nausea or vomiting., Disp: , Rfl:  .  ranitidine (ZANTAC) 150 MG tablet, Take 150 mg by mouth 2 (two) times daily., Disp: , Rfl:  .  simethicone (MYLICON) 80 MG chewable tablet, Chew 80 mg by mouth every 6 (six) hours as needed for flatulence., Disp: , Rfl:  .  sucralfate (CARAFATE) 1 g tablet, TAKE 1 TABLET (1 G TOTAL) BY MOUTH 4 (FOUR) TIMES DAILY - WITH MEALS AND AT BEDTIME. (Patient taking differently: Take 1 g by mouth 3 (three) times daily. ), Disp: 120 tablet, Rfl: 0 .  traMADol (ULTRAM) 50 MG tablet, Take 1 tablet (50 mg total) by mouth every 6 (six) hours as needed., Disp: 10 tablet, Rfl: 0 .  warfarin (COUMADIN) 2 MG tablet, TAKE 1 TABLET DAILY OR AS DIRECTED, Disp: 90 tablet, Rfl: 1:  Allergies  Allergen Reactions  . Adhesive [Tape] Other (See Comments)    Tears skin, Please use "paper" tape  . Amiodarone Other (See Comments)     Thyroid and liver and  kidney problems  . Codeine Nausea And Vomiting  . Hydrocodone Other (See Comments)    hallucination  . Morphine And Related Nausea And Vomiting  . Remeron [Mirtazapine] Other (See Comments)    Hallucinations and make pt loopy  . Requip [Ropinirole Hcl] Other (See Comments)    Headache   . Zinc Nausea Only  . Reglan [Metoclopramide] Hives, Itching,  Rash and Other (See Comments)    Made face red, also  :  Family History  Problem Relation Age of Onset  . Colon cancer Mother 9  . Heart disease Mother   . Heart disease Father   . Heart attack Father   . Heart disease Maternal Grandmother   :  Social History   Socioeconomic History  . Marital status: Widowed    Spouse name: Not on file  . Number of children: Not on file  . Years of education: Not on file  . Highest education level: Not on file  Occupational History  . Not on file  Social Needs  . Financial resource strain: Not on file  . Food insecurity:    Worry: Not on file    Inability: Not on file  . Transportation needs:    Medical: Not on file    Non-medical: Not on file  Tobacco Use  . Smoking status: Never Smoker  . Smokeless tobacco: Never Used  Substance and Sexual Activity  . Alcohol use: No  . Drug use: No  . Sexual activity: Never  Lifestyle  . Physical activity:    Days per week: Not on file    Minutes per session: Not on file  . Stress: Not on file  Relationships  . Social connections:    Talks on phone: Not on file    Gets together: Not on file    Attends religious service: Not on file    Active member of club or organization: Not on file    Attends meetings of clubs or organizations: Not on file    Relationship status: Not on file  . Intimate partner violence:    Fear of current or ex partner: Not on file    Emotionally abused: Not on file    Physically abused: Not on file    Forced sexual activity: Not on file  Other Topics Concern  . Not on file  Social History Narrative   Lives in  [place].  Continues to work.  :  Pertinent items are noted in HPI.  Exam: Blood pressure (!) 183/75, pulse 81, temperature 97.7 F (36.5 C), temperature source Oral, resp. rate 18, height 4\' 11"  (1.499 m), weight 98 lb 3.2 oz (44.5 kg), SpO2 100 %.  ECOG 1 General appearance: alert and cooperative appeared without distress. Head: atraumatic without any abnormalities. Eyes: conjunctivae/corneas clear. PERRL.  Sclera anicteric. Throat: lips, mucosa, and tongue normal; without oral thrush or ulcers. Resp: clear to auscultation bilaterally without rhonchi, wheezes or dullness to percussion. Cardio: regular rate and rhythm, S1, S2 normal, no murmur, click, rub or gallop GI: soft, non-tender; bowel sounds normal; no masses,  no organomegaly Skin: Skin color, texture, turgor normal. No rashes or lesions Lymph nodes: Cervical, supraclavicular, and axillary nodes normal. Neurologic: Grossly normal without any motor, sensory or deep tendon reflexes. Musculoskeletal: No joint deformity or effusion.  CBC    Component Value Date/Time   WBC 6.1 04/26/2018 1150   RBC 4.31 04/26/2018 1150   HGB 13.8 04/26/2018 1150   HGB 12.1 01/02/2016 1023   HCT 41.2 04/26/2018 1150   HCT 37.8 01/02/2016 1023   PLT 192 04/26/2018 1150   PLT 200 01/02/2016 1023   MCV 95.6 04/26/2018 1150   MCV 86.6 01/02/2016 1023   MCH 32.0 04/26/2018 1150   MCHC 33.5 04/26/2018 1150   RDW 13.6 04/26/2018 1150   RDW 18.9 (H) 01/02/2016 1023   LYMPHSABS 2,603 03/13/2018 1522  LYMPHSABS 1.5 01/02/2016 1023   MONOABS 528 03/23/2017 1445   MONOABS 0.5 01/02/2016 1023   EOSABS 239 03/13/2018 1522   EOSABS 0.3 01/02/2016 1023   BASOSABS 46 03/13/2018 1522   BASOSABS 0.1 01/02/2016 1023     Chemistry      Component Value Date/Time   NA 142 04/26/2018 1150   NA 143 12/08/2015 1209   K 4.0 04/26/2018 1150   K 4.2 12/08/2015 1209   CL 110 04/26/2018 1150   CO2 25 04/26/2018 1150   CO2 25 12/08/2015 1209    BUN 16 04/26/2018 1150   BUN 25.3 12/08/2015 1209   CREATININE 0.94 04/26/2018 1150   CREATININE 1.06 (H) 03/13/2018 1522   CREATININE 1.1 12/08/2015 1209      Component Value Date/Time   CALCIUM 8.6 (L) 04/26/2018 1150   CALCIUM 9.2 12/08/2015 1209   ALKPHOS 99 04/26/2018 1150   ALKPHOS 104 12/08/2015 1209   AST 23 04/26/2018 1150   AST 16 12/08/2015 1209   ALT 15 04/26/2018 1150   ALT 10 12/08/2015 1209   BILITOT 0.8 04/26/2018 1150   BILITOT <0.30 12/08/2015 1209       Assessment and Plan:   84 year old woman with the following:  1.  High-grade urothelial carcinoma arising from the bladder diagnosed in August 2019.  She presented with hematuria and underwent a TURBT that showed muscle invasive poorly differentiated tumor.  Imaging studies in July 2019 did not show any evidence of metastatic disease.  The natural course of this disease was reviewed today and treatment options were discussed.  Given the fact that she had a muscle invasive disease, the most definitive approach would be a radical cystectomy.  Given her age and her previous operations this might be a difficult decision for her and she might not be an optimal surgical candidate.  She will be evaluated by Dr. Tresa Moore for this proposition.  I discussed the role of neoadjuvant chemotherapy in this particular setting.  Given the fact that we are dealing with early stage tumor, her borderline kidney functioning in the setting of a solitary kidney I feel that she is not a candidate for cisplatin based chemotherapy as a neoadjuvant approach.  Alternative therapy if surgery is not an option, would be radiation therapy concomitantly with low-dose carboplatin.  Her candidacy for radiation will determine the amount of radiation that she has received in the past for endometrial cancer.  If she elected against both of those approaches, observation and surveillance with intermittent cystoscopies and TURBT would be the next option.  She  understand that this option is not curative and would be palliative.  After discussion today, I have recommended obtaining PET CT scan to update her staging, radiation oncology evaluation to determine her candidacy for possible radiation and she has follow-up with Dr. Tresa Moore to determine whether radical cystectomy is an option for her.  If she decided to proceed with radical cystectomy, I recommend for her to proceed without neoadjuvant chemotherapy at this time.   2.  Follow-up: We will be in the next few weeks to follow her progress.  60  minutes was spent with the patient face-to-face today.  More than 50% of time was dedicated to reviewing imaging studies, pathology reports and discussion with the natural course of this disease as well as treatment options.    Thank you for the referral. A copy of this consult has been forwarded to the requesting physician.

## 2018-05-23 NOTE — Telephone Encounter (Signed)
Scheduled appt per 9/17 los - gave patient AVS and calender per los.

## 2018-05-24 ENCOUNTER — Encounter: Payer: Self-pay | Admitting: Radiation Oncology

## 2018-05-29 ENCOUNTER — Other Ambulatory Visit: Payer: Self-pay | Admitting: Physician Assistant

## 2018-05-30 DIAGNOSIS — C659 Malignant neoplasm of unspecified renal pelvis: Secondary | ICD-10-CM | POA: Diagnosis not present

## 2018-05-30 DIAGNOSIS — C672 Malignant neoplasm of lateral wall of bladder: Secondary | ICD-10-CM | POA: Diagnosis not present

## 2018-05-30 DIAGNOSIS — Z905 Acquired absence of kidney: Secondary | ICD-10-CM | POA: Diagnosis not present

## 2018-05-31 ENCOUNTER — Other Ambulatory Visit: Payer: Self-pay | Admitting: Oncology

## 2018-05-31 ENCOUNTER — Ambulatory Visit (INDEPENDENT_AMBULATORY_CARE_PROVIDER_SITE_OTHER): Payer: Medicare Other | Admitting: *Deleted

## 2018-05-31 DIAGNOSIS — I495 Sick sinus syndrome: Secondary | ICD-10-CM | POA: Diagnosis not present

## 2018-05-31 DIAGNOSIS — C679 Malignant neoplasm of bladder, unspecified: Secondary | ICD-10-CM

## 2018-05-31 NOTE — Progress Notes (Signed)
Remote pacemaker transmission.   

## 2018-06-01 ENCOUNTER — Encounter: Payer: Self-pay | Admitting: Cardiology

## 2018-06-05 ENCOUNTER — Ambulatory Visit: Payer: Medicare Other | Admitting: Pharmacist

## 2018-06-05 ENCOUNTER — Telehealth: Payer: Self-pay | Admitting: *Deleted

## 2018-06-05 DIAGNOSIS — Z7901 Long term (current) use of anticoagulants: Secondary | ICD-10-CM

## 2018-06-05 LAB — POCT INR: INR: 1.9 — AB (ref 2.0–3.0)

## 2018-06-05 NOTE — Telephone Encounter (Signed)
Medical Records faxed to [facility]; release [phone]

## 2018-06-05 NOTE — Patient Instructions (Signed)
Description   Take 2 tablets today, then start taking 1 tablet daily except 1.5 tablets on Mondays, Wednesdays, and Fridays.  Recheck INR in 2 weeks. Call us with any medication changes or concerns # [phone].

## 2018-06-09 ENCOUNTER — Ambulatory Visit (HOSPITAL_COMMUNITY): Payer: Medicare Other

## 2018-06-11 ENCOUNTER — Other Ambulatory Visit: Payer: Self-pay | Admitting: Internal Medicine

## 2018-06-12 ENCOUNTER — Telehealth: Payer: Self-pay | Admitting: *Deleted

## 2018-06-12 NOTE — Telephone Encounter (Signed)
Patient called to cancel her appointment. She is waiting to have a PET scan and said she would call back to reschedule.

## 2018-06-14 ENCOUNTER — Ambulatory Visit: Payer: Medicare Other | Admitting: Radiation Oncology

## 2018-06-14 ENCOUNTER — Ambulatory Visit: Payer: Medicare Other

## 2018-06-19 ENCOUNTER — Telehealth: Payer: Self-pay

## 2018-06-19 ENCOUNTER — Ambulatory Visit: Payer: Medicare Other

## 2018-06-19 DIAGNOSIS — Z7901 Long term (current) use of anticoagulants: Secondary | ICD-10-CM

## 2018-06-19 LAB — POCT INR: INR: 2.5 (ref 2.0–3.0)

## 2018-06-19 NOTE — Patient Instructions (Signed)
Description   Continue taking 1 tablet daily except 1.5 tablets on Mondays, Wednesdays, and Fridays.  Recheck INR in 2-3 weeks. Call us with any medication changes or concerns # [phone].

## 2018-06-19 NOTE — Telephone Encounter (Signed)
Received VM from pt daughter, [name], requesting to know if Dr. Alen Blew had been able to request appeal through insurance for PET scan. Dr. Alen Blew to return to office tomorrow, and pt to receive call back at that time to communicate plan for scan moving forward. Pt daughter verbalized understanding.

## 2018-06-21 ENCOUNTER — Telehealth: Payer: Self-pay

## 2018-06-21 NOTE — Telephone Encounter (Signed)
Spoke with patient daughter Beverlee Nims regarding [employer] denial of the scan and CT scans ordered in lieu of PET. Informed her that the CT scans have been authorized and can provide the number to get scheduled before next appointment. Beverlee Nims stated that she does not want the patient to have the CT scans at this time as she is concerned of the repeat effects of the contrast on the patient's solitary kidney. She also does not feel that the CT is conclusive enough regarding disease progression. She understands follow up appointment is 10/21. Explained that will make MD aware of concerns.

## 2018-06-23 ENCOUNTER — Encounter: Payer: Self-pay | Admitting: Cardiology

## 2018-06-26 ENCOUNTER — Telehealth: Payer: Self-pay | Admitting: Radiation Oncology

## 2018-06-26 ENCOUNTER — Inpatient Hospital Stay: Payer: Medicare Other | Attending: Oncology | Admitting: Oncology

## 2018-06-26 ENCOUNTER — Telehealth: Payer: Self-pay | Admitting: Oncology

## 2018-06-26 VITALS — BP 202/89 | HR 73 | Temp 97.6°F | Resp 18 | Ht 59.0 in | Wt 100.5 lb

## 2018-06-26 DIAGNOSIS — Z923 Personal history of irradiation: Secondary | ICD-10-CM | POA: Diagnosis not present

## 2018-06-26 DIAGNOSIS — Z8542 Personal history of malignant neoplasm of other parts of uterus: Secondary | ICD-10-CM | POA: Diagnosis not present

## 2018-06-26 DIAGNOSIS — C679 Malignant neoplasm of bladder, unspecified: Secondary | ICD-10-CM | POA: Diagnosis not present

## 2018-06-26 NOTE — Telephone Encounter (Signed)
Scheduled appt per 10/21 los - patient aware of appt - per patient request no print out needed.

## 2018-06-26 NOTE — Telephone Encounter (Signed)
Patient cancelled appt on 06/14/2018 with Dr. Tammi Klippel. Her PET scan was denied by insurance. Pt saw Dr. Alen Blew on 06/26/18 he is trying to get PET authorized if not authorized he will do CT Scan w/o contrast if pt is able to have radiation again after the above is completed they will submit another referral spoke with daughter [name]. Dr. Alen Blew it suppose to reach out to Dr. Tammi Klippel to see if radiation is even a option for this patient because of the pass radiations treatment per [name]

## 2018-06-26 NOTE — Progress Notes (Signed)
Hematology and Oncology Follow Up Visit  [name] [id number] 08-Mar-1934 84 y.o. 06/26/2018 9:48 AM [name], MDMcKeown, Gwyndolyn Saxon, MD   Principle Diagnosis: 84 year old woman with a bladder cancer diagnosed in August 2019.  She was found to have muscle invasive disease with poorly differentiated tumor without any evidence of metastasis.   Prior Therapy:  She is status post TURBT completed on 05/01/2018 which showed a poorly differentiated carcinoma with muscle invasion.  Current therapy: Under evaluation for definitive therapy.  Interim History: [name] presents today for a follow-up visit.  Since the last visit, she reports no major changes in her health.  She denies any abdominal discomfort or pelvic pain.  She denies any hematuria or dysuria.  Her appetite remain excellent and her performance status is unchanged.  Her weight has increased but she does report occasional fluid retention and currently on Lasix.  Her quality of life remain unchanged.   does not report any headaches, blurry vision, syncope or seizures. Does not report any fevers, chills or sweats.  Does not report any cough, wheezing or hemoptysis.  Does not report any chest pain, palpitation, orthopnea or leg edema.  Does not report any nausea, vomiting or abdominal pain.  Does not report any constipation or diarrhea.  Does not report any skeletal complaints.    Does not report frequency, urgency or hematuria.  Does not report any skin rashes or lesions. Does not report any heat or cold intolerance.  Does not report any lymphadenopathy or petechiae.  Does not report any anxiety or depression.  Remaining review of systems is negative.    Medications: I have reviewed the patient's current medications.  Current Outpatient Medications  Medication Sig Dispense Refill  . acetaminophen (TYLENOL) 500 MG tablet Take 500 mg by mouth every 6 (six) hours as needed for mild pain.    [name] ALPRAZolam (XANAX) 0.5 MG tablet TAKE  1/2-1 TABLET BY MOUTH 3 TIMES A DAY AS NEEDED FOR ANXIETY (Patient taking differently: Take 0.25 mg by mouth at bedtime as needed (for sleep). ) 90 tablet 0  . Biotin 1000 MCG tablet Take 1,000 mcg by mouth daily.    . Cholecalciferol (VITAMIN D-3) 5000 units TABS Take 5,000 Units by mouth daily.    [name] diltiazem (CARDIZEM CD) 180 MG 24 hr capsule TAKE 1 CAPSULE BY MOUTH EVERY DAY 90 capsule 1  . furosemide (LASIX) 20 MG tablet TAKE 1 TABLET BY MOUTH EVERY DAY (Patient taking differently: Take 20 mg by mouth every other day. ) 90 tablet 1  . gabapentin (NEURONTIN) 300 MG capsule Take 300 mg by mouth 2 (two) times daily.    [name] gabapentin (NEURONTIN) 300 MG capsule TAKE 1 CAPSULE BY MOUTH THREE TIMES A DAY 270 capsule 1  . levothyroxine (SYNTHROID, LEVOTHROID) 100 MCG tablet Take 50 mcg by mouth daily before breakfast.    . levothyroxine (SYNTHROID, LEVOTHROID) 100 MCG tablet TAKE 1/2 TABLET BY MOUTH DAILY BEFORE BREAKFAST. 45 tablet 1  . Magnesium 500 MG TABS Take 500 mg by mouth every evening.    . metoprolol tartrate (LOPRESSOR) 100 MG tablet Take 1 tablet (100 mg total) by mouth 2 (two) times daily. (Patient taking differently: Take 50 mg by mouth 2 (two) times daily. ) 180 tablet 1  . nitroGLYCERIN (NITROSTAT) 0.4 MG SL tablet Place 1 tablet (0.4 mg total) under the tongue every 5 (five) minutes as needed for chest pain (MAX 3 TABLETS). (Patient not taking: Reported on 05/23/2018) 25 tablet prn  .  ondansetron (ZOFRAN-ODT) 4 MG disintegrating tablet Take 8 mg by mouth every 8 (eight) hours as needed for nausea or vomiting.    . ranitidine (ZANTAC) 150 MG tablet Take 150 mg by mouth 2 (two) times daily.    . simethicone (MYLICON) 80 MG chewable tablet Chew 80 mg by mouth every 6 (six) hours as needed for flatulence.    . sucralfate (CARAFATE) 1 g tablet TAKE 1 TABLET (1 G TOTAL) BY MOUTH 4 (FOUR) TIMES DAILY - WITH MEALS AND AT BEDTIME. (Patient taking differently: Take 1 g by mouth 3 (three) times  daily. ) 120 tablet 0  . traMADol (ULTRAM) 50 MG tablet Take 1 tablet (50 mg total) by mouth every 6 (six) hours as needed. 10 tablet 0  . warfarin (COUMADIN) 2 MG tablet TAKE 1 TABLET DAILY OR AS DIRECTED 90 tablet 1   No current facility-administered medications for this visit.      Allergies:  Allergies  Allergen Reactions  . Adhesive [Tape] Other (See Comments)    Tears skin, Please use "paper" tape  . Amiodarone Other (See Comments)     Thyroid and liver and kidney problems  . Hydrocodone Other (See Comments)    HALLLUCINATIONS  . Remeron [Mirtazapine] Other (See Comments)    Hallucinations and make pt loopy  . Codeine Nausea And Vomiting  . Morphine And Related Nausea And Vomiting  . Reglan [Metoclopramide] Hives, Itching, Rash and Other (See Comments)  . Requip [Ropinirole Hcl] Other (See Comments)    Headache   . Zinc Nausea Only    Past Medical History, Surgical history, Social history, and Family History were reviewed and updated.    Physical Exam: Blood pressure (!) 202/89, pulse 73, temperature 97.6 F (36.4 C), temperature source Oral, resp. rate 18, height 4' 11" (1.499 m), weight 100 lb 8 oz (45.6 kg), SpO2 100 %.   ECOG: 1 General appearance: alert and cooperative appeared without distress. Head: Normocephalic, without obvious abnormality Oropharynx: No oral thrush or ulcers. Eyes: No scleral icterus.  Pupils are equal and round reactive to light. Lymph nodes: Cervical, supraclavicular, and axillary nodes normal. Heart:regular rate and rhythm, S1, S2 normal, no murmur, click, rub or gallop Lung:chest clear, no wheezing, rales, normal symmetric air entry Abdomin: soft, non-tender, without masses or organomegaly. Neurological: No motor, sensory deficits.  Intact deep tendon reflexes. Skin: No rashes or lesions.  No ecchymosis or petechiae. Musculoskeletal: No joint deformity or effusion.     Lab Results: Lab Results  Component Value Date   WBC 6.1  04/26/2018   HGB 13.8 04/26/2018   HCT 41.2 04/26/2018   MCV 95.6 04/26/2018   PLT 192 04/26/2018     Chemistry      Component Value Date/Time   NA 142 04/26/2018 1150   NA 143 12/08/2015 1209   K 4.0 04/26/2018 1150   K 4.2 12/08/2015 1209   CL 110 04/26/2018 1150   CO2 25 04/26/2018 1150   CO2 25 12/08/2015 1209   BUN 16 04/26/2018 1150   BUN 25.3 12/08/2015 1209   CREATININE 0.94 04/26/2018 1150   CREATININE 1.06 (H) 03/13/2018 1522   CREATININE 1.1 12/08/2015 1209      Component Value Date/Time   CALCIUM 8.6 (L) 04/26/2018 1150   CALCIUM 9.2 12/08/2015 1209   ALKPHOS 99 04/26/2018 1150   ALKPHOS 104 12/08/2015 1209   AST 23 04/26/2018 1150   AST 16 12/08/2015 1209   ALT 15 04/26/2018 1150   ALT 10 12/08/2015  1209   BILITOT 0.8 04/26/2018 1150   BILITOT <0.30 12/08/2015 1209       Impression and Plan:  84-year-old woman with the following:  1.    Localized high-grade urothelial carcinoma arising from the bladder diagnosed in August 2019.    Her disease status was discussed today in detail with the patient and her family.  She has met with Dr. Manny and felt that she is not a surgical candidate because of her previous operations and scar tissue.  She also had pelvic radiation due to endometrial cancer under the care of Dr. Crystal in the 1980s.  Is unclear whether she can receive any further radiation at this time.  If radiation is not an option for her at this time and there is no systemic metastasis, then I would recommend periodic cystoscopy and fulguration of any recurrent tumor at this time.  She was supposed to have a PET scan done but it was declined and we will attempt to obtain these imaging studies in the near future.    Systemic chemotherapy was discussed today in detail and would be an option only if she has advanced disease.    2.  Follow-up: We will be in 4 to 6 weeks to follow her progress.  25  minutes was spent with the patient face-to-face  today.  More than 50% of time was dedicated to discussing the natural course of her disease, disease treatment options and coordinating plan of care.      , MD 10/21/20199:48 AM 

## 2018-06-27 ENCOUNTER — Ambulatory Visit (INDEPENDENT_AMBULATORY_CARE_PROVIDER_SITE_OTHER): Payer: Medicare Other

## 2018-06-27 ENCOUNTER — Telehealth: Payer: Self-pay | Admitting: *Deleted

## 2018-06-27 VITALS — BP 178/88 | HR 83 | Temp 96.3°F | Ht 59.0 in | Wt 99.0 lb

## 2018-06-27 DIAGNOSIS — Z23 Encounter for immunization: Secondary | ICD-10-CM | POA: Diagnosis not present

## 2018-06-27 NOTE — Telephone Encounter (Signed)
-----   Message from Wyatt Portela, MD sent at 06/27/2018  3:43 PM EDT ----- Regarding: FW: PET Scan  Please let the patient know that I am working on peer review and will get her scheduled once we get the [facility].  ----- Message ----- From: Gaspar Bidding Sent: 06/27/2018   3:37 PM EDT To: April H Pait, Randolm Idol, RN, Gaspar Bidding, # Subject: RE: PET Scan                                   Buelah Rennie,  Please see April's message below.  Thank you, Darlena ----- Message ----- From: Augustine Radar Sent: 06/27/2018   3:15 PM EDT To: Randolm Idol, RN, Gaspar Bidding, # Subject: RE: PET Scan                                   pls notify the pt of the cancelled pet, thanks. ----- Message ----- From: Gaspar Bidding Sent: 06/27/2018   2:00 PM EDT To: April H Pait, Randolm Idol, RN, Gaspar Bidding, # Subject: PET Scan                                       Hello Dr. Alen Blew,  [facility] denied the pervious PET and suggested a peer to peer, for this reason [facility] will not allow me to submit a new request for the PET. Please see peer to peer information below.  [phone] Option#3 Case# [id number]  Please let me know how you want to proceed.   Darlena   ----- Message ----- From: Gaspar Bidding Sent: 06/21/2018   1:54 PM EDT To: April H Pait, Scot Dock, RN  Referral authorized on 9/26 ----- Message ----- From: Scot Dock, RN Sent: 06/21/2018  11:03 AM EDT To: Gaspar Bidding  Have the ordered CT scans been authorized at this time?  Thanks  ----- Message ----- From: Gaspar Bidding Sent: 06/20/2018   2:24 PM EDT To: Wyatt Portela, MD, Scot Dock, RN  Hello Seth Bake,  Please see below.  Darlena ----- Message ----- From: Wyatt Portela, MD Sent: 05/31/2018   4:01 PM EDT To: Gaspar Bidding  She will need CT scan instead. Order will be in ASAP.   FS ----- Message ----- From: Gaspar Bidding Sent: 05/31/2018  12:48 PM EDT To: April H Pait, Randolm Idol, RN, Gaspar Bidding,  #  Hello Dr. Alen Blew,  [facility] denied the PET and stated a peer to peer is needed. Please see peer to peer information below.  [phone] Option#3 Case# [id number]  Please let me know how you want to proceed.  Darlena

## 2018-06-27 NOTE — Telephone Encounter (Signed)
Spoke with patient's daughter Beverlee Nims. Per  Dr Alen Blew he is working on a peer to peer review to get scan scheduled.

## 2018-06-28 ENCOUNTER — Other Ambulatory Visit: Payer: Self-pay | Admitting: Oncology

## 2018-06-28 ENCOUNTER — Telehealth: Payer: Self-pay | Admitting: *Deleted

## 2018-06-28 DIAGNOSIS — C679 Malignant neoplasm of bladder, unspecified: Secondary | ICD-10-CM

## 2018-06-28 NOTE — Telephone Encounter (Signed)
Spoke with daughter Beverlee Nims. Per dr Alen Blew, despite peer to peer review, PET was denied. CT ordered.

## 2018-06-28 NOTE — Telephone Encounter (Signed)
-----   Message from Wyatt Portela, MD sent at 06/28/2018 10:34 AM EDT ----- Please let her know that PET is still denied despite by peer to peer attempt. CT scan instead was ordered.

## 2018-06-28 NOTE — Progress Notes (Signed)
Peer to peer review attempted to  reverse the decision of denying PET CT scan.  I was told by [place] healthcare representative that peer to peer review will not alter the decision at this time.  The indication for PET scan according to the guidelines is to guide treatment strategy which is clearly the case here but I was not able to reverse that decision at this time.  We will obtain CT scan without contrast which was discussed with the patient and her daughters on her previous visit on June 26, 2018.

## 2018-06-29 ENCOUNTER — Encounter (HOSPITAL_COMMUNITY): Payer: Medicare Other

## 2018-06-29 LAB — CUP PACEART REMOTE DEVICE CHECK
Battery Remaining Longevity: 51 mo
Battery Voltage: 2.79 V
Brady Statistic AP VP Percent: 0 %
Brady Statistic AS VP Percent: 0 %
Date Time Interrogation Session: 20190925122421
Implantable Lead Implant Date: 20111027
Implantable Lead Location: 753859
Implantable Lead Serial Number: 548229
Implantable Lead Serial Number: 686026
Implantable Pulse Generator Implant Date: 20111027
Lead Channel Impedance Value: 362 Ohm
Lead Channel Impedance Value: 484 Ohm
Lead Channel Pacing Threshold Amplitude: 0.375 V
Lead Channel Pacing Threshold Pulse Width: 0.4 ms
Lead Channel Setting Pacing Amplitude: 3.5 V
Lead Channel Setting Pacing Pulse Width: 0.4 ms
MDC IDC LEAD IMPLANT DT: 20111027
MDC IDC LEAD LOCATION: 753860
MDC IDC MSMT BATTERY IMPEDANCE: 1357 Ohm
MDC IDC MSMT LEADCHNL RA PACING THRESHOLD PULSEWIDTH: 0.4 ms
MDC IDC MSMT LEADCHNL RV PACING THRESHOLD AMPLITUDE: 1.375 V
MDC IDC SET LEADCHNL RA PACING AMPLITUDE: 2 V
MDC IDC SET LEADCHNL RV SENSING SENSITIVITY: 2 mV
MDC IDC STAT BRADY AP VS PERCENT: 75 %
MDC IDC STAT BRADY AS VS PERCENT: 25 %

## 2018-07-03 ENCOUNTER — Other Ambulatory Visit: Payer: Self-pay | Admitting: Internal Medicine

## 2018-07-03 ENCOUNTER — Ambulatory Visit (HOSPITAL_COMMUNITY)
Admission: RE | Admit: 2018-07-03 | Discharge: 2018-07-03 | Disposition: A | Discharge status: Home / Self Care | Payer: Medicare Other | Source: Ambulatory Visit | Attending: Oncology | Admitting: Oncology

## 2018-07-03 DIAGNOSIS — J90 Pleural effusion, not elsewhere classified: Secondary | ICD-10-CM | POA: Diagnosis not present

## 2018-07-03 DIAGNOSIS — C649 Malignant neoplasm of unspecified kidney, except renal pelvis: Secondary | ICD-10-CM | POA: Diagnosis not present

## 2018-07-03 DIAGNOSIS — C679 Malignant neoplasm of bladder, unspecified: Secondary | ICD-10-CM | POA: Diagnosis not present

## 2018-07-04 ENCOUNTER — Telehealth: Payer: Self-pay

## 2018-07-04 NOTE — Telephone Encounter (Signed)
Made patient daughter Beverlee Nims aware of results.

## 2018-07-04 NOTE — Telephone Encounter (Signed)
-----   Message from Wyatt Portela, MD sent at 07/04/2018  8:19 AM EDT ----- Please let her know her CT scan is normal.

## 2018-07-05 ENCOUNTER — Ambulatory Visit (HOSPITAL_COMMUNITY): Payer: Medicare Other

## 2018-07-06 DIAGNOSIS — C672 Malignant neoplasm of lateral wall of bladder: Secondary | ICD-10-CM | POA: Diagnosis not present

## 2018-07-06 DIAGNOSIS — Z8554 Personal history of malignant neoplasm of ureter: Secondary | ICD-10-CM | POA: Diagnosis not present

## 2018-07-07 ENCOUNTER — Ambulatory Visit: Payer: Medicare Other | Admitting: Pharmacist

## 2018-07-07 DIAGNOSIS — Z7901 Long term (current) use of anticoagulants: Secondary | ICD-10-CM | POA: Diagnosis not present

## 2018-07-07 LAB — POCT INR: INR: 2.6 (ref 2.0–3.0)

## 2018-07-07 NOTE — Patient Instructions (Signed)
Description   Continue taking 1 tablet daily except 1.5 tablets on Mondays, Wednesdays, and Fridays.  Recheck INR in 2-3 weeks. Call us with any medication changes or concerns # [phone].

## 2018-07-10 ENCOUNTER — Other Ambulatory Visit: Payer: Self-pay | Admitting: Urology

## 2018-07-10 NOTE — Progress Notes (Signed)
Cardiology Office Note   Date:  07/13/2018   ID:  [name], DOB Dec 20, 1933, MRN [id number]  PCP:  [name], MD  Cardiologist: Pankaj Haack [name] MD  Chief Complaint  Patient presents with  . Atrial Fibrillation      History of Present Illness: [name] is a 84 y.o. female who presents for follow-up Afib  She has a past history of tachybradycardia syndrome .  She has a permanent pacemaker implanted 07/01/10. She has a remote history of repair of a patent ductus arteriosus with repair. She has a history of moderate valvular aortic stenosis. She has had a history of congestive heart failure. She was admitted 08/25/12-08/29/12 for exacerbation of CHF. She was hospitalized again in November 2015 for congestive heart failure and pneumonia. Her echocardiogram 08/26/12 showed ejection fraction of 60-65% with grade 2 diastolic dysfunction and a peak aortic valve gradient of 29 with a mean aortic valve gradient of 14. Her last echocardiogram in March 2017 showed ejection fraction of 60-65%. The aortic valve showed mild- moderate stenosis and moderate regurgitation.  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 She had carotid Dopplers which did not show any significant obstruction.  In 2017 she developed symptoms of dyspnea and fatigue. She was found to be anemic. Anemia improved with iron and Epogen. 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 She underwent revision of gastric bypass in 2017 by [name]. More recently had TUR resection of a bladder tumor with intravesicular chemotherapy in August 2019. Tumor was invasive and treatment options currently underway with oncology. According to the patient she is not a candidate for IV chemo. She does not want to go through with cystectomy. Is getting intravesicular therapy and hasn't seen the radiation oncologist yet.   Last pacemaker check in  September showed Afib burden of 6%.   She notes that her BP has being running high the last couple of months. She has infrequent palpitations. No SOB, CP, or dizziness.  Past Medical History:  Diagnosis Date  . Adenocarcinoma of stomach ([place]) 11/11/2015   gastric mass on egd  . Anal fissure   . Anemia    hx 3/17 iron infusion, on aranesp and injected B12 since 11/2015.   [name] Anxiety   . Aortic root dilatation ([place])   . Arthritis   . 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 CAD (coronary artery disease)   . Chronic kidney disease    only has one kidney rt lft rem 03 ca     dr. Risa Grill  . Elevated LFTs    hepatic steatosis  . Gastroparesis   . GERD (gastroesophageal reflux disease)   . History of uterine cancer 1980s   treated with hysterectomy, external radiation and radiation seed implants.   [name] HTN (hypertension)   . Hyperlipemia   . Hypothyroidism   . Iron deficiency anemia due to chronic blood loss 11/24/2015  . Moderate aortic stenosis   . Neuropathy    feet/legs  . No blood products 02/27/2016  . Osteomyelitis ([place]) as child  . PAF (paroxysmal atrial fibrillation) ([place])   . PONV (postoperative nausea and vomiting)    long time ago  . Radiation proctitis   . Restless leg syndrome   . Stroke ([place]) 15   partial stroke left legally blind; left eye   . Tachycardia-bradycardia syndrome ([place])    s/p PPM by Dr Doreatha Lew (MDT)  07/03/10    Past Surgical History:  Procedure Laterality Date  . CARDIAC CATHETERIZATION  2008  . CHOLECYSTECTOMY  1970s  . COLONOSCOPY N/A 11/11/2015   Procedure: COLONOSCOPY;  Surgeon: [name], MD;  Location: [facility];  Service: Endoscopy;  Laterality: N/A;  . CYSTOSCOPY W/ RETROGRADES Right 05/01/2018   Procedure: CYSTOSCOPY WITH RIGHT RETROGRADE PYELOGRAM;  Surgeon: Lucas Mallow, MD;  Location: [facility];  Service: Urology;  Laterality: Right;  . ESOPHAGOGASTRODUODENOSCOPY N/A 11/11/2015   Procedure: ESOPHAGOGASTRODUODENOSCOPY (EGD);  Surgeon: [name], MD;  Location: [facility];  Service: Endoscopy;  Laterality: N/A;  . ESOPHAGOGASTRODUODENOSCOPY N/A 02/05/2016   Procedure: ESOPHAGOGASTRODUODENOSCOPY (EGD);  Surgeon: Ladene Artist, MD;  Location[facility];  Service: Endoscopy;  Laterality: N/A;  . ESOPHAGOGASTRODUODENOSCOPY N/A 04/02/2016   Procedure: ESOPHAGOGASTRODUODENOSCOPY (EGD);  Surgeon: [name], MD;  Location: [facility];  Service: Endoscopy;  Laterality: N/A;  . ESOPHAGOGASTRODUODENOSCOPY (EGD) WITH PROPOFOL N/A 04/06/2017   Procedure: ESOPHAGOGASTRODUODENOSCOPY (EGD) WITH PROPOFOL;  Surgeon: [name], MD;  [facility];  Service: Endoscopy;  Laterality: N/A;  . GASTRECTOMY N/A 01/15/2016   Procedure: OPEN PARTIAL GASTRECTOMY;  Surgeon: Stark Klein, MD;  Location: [place];  Service: General;  Laterality: N/A;  . GASTRECTOMY Left 02/24/2016   Procedure: OPEN J TUBE;  Surgeon: Stark Klein, MD;  Location: [place];  Service: General;  Laterality: Left;  [name] GASTROJEJUNOSTOMY N/A 05/18/2016   Procedure: ROUX EN Y GASTROJEJUNOSTOMY;  Surgeon: Stark Klein, MD;  Location: [place];  Service: General;  Laterality: N/A;  . I&D KNEE WITH POLY EXCHANGE Right 12/17/2013   Procedure: IRRIGATION AND DEBRIDEMEN RIGHT TOTAL  KNEE WITH POLY EXCHANGE;  Surgeon: Mauri Pole, MD;  [facility];  Service: Orthopedics;  Laterality: Right;  . IR GENERIC HISTORICAL  06/17/2016   IR RADIOLOGIST EVAL & MGMT 06/17/2016 Ascencion Dike, PA-C [facility] INTERV RAD  . LAPAROSCOPY N/A 01/15/2016   Procedure: LAPAROSCOPY DIAGNOSTIC;  Surgeon: Stark Klein, MD;  Location: [place];  Service: General;  Laterality: N/A;  . LYSIS OF ADHESION  05/18/2016   Procedure: LYSIS OF ADHESION;  Surgeon: Stark Klein, MD;  Location: [place];  Service: General;;  . NEPHRECTOMY Left    renal cell cancer  . PACEMAKER INSERTION  07/04/2011   MDT PPM implanted by Dr Doreatha Lew for tachy/brady; managed by Dr Thompson Grayer   . PATELLAR TENDON REPAIR Right 03/06/2014     Procedure: AVULSION WITH PATELLA TENDON REPAIR;  Surgeon: Mauri Pole, MD;  [facility];  Service: Orthopedics;  Laterality: Right;  . PATENT DUCTUS ARTERIOUS REPAIR  ~ 1949  . TOTAL ABDOMINAL HYSTERECTOMY  85  . TOTAL HIP REVISION Right 2009   initial replacment surg  . TOTAL KNEE ARTHROPLASTY  02/07/2012   Procedure: TOTAL KNEE ARTHROPLASTY;  Surgeon: Mauri Pole, MD;  [facility];  Service: Orthopedics;  Laterality: Right;  . TOTAL KNEE REVISION Right 07/29/2014   Procedure: RIGHT KNEE REVISION OF PREVIOUS REPAIR EXTENSOR MECHANISM;  Surgeon: Mauri Pole, MD;  [facility];  Service: Orthopedics;  Laterality: Right;  . TRANSURETHRAL RESECTION OF BLADDER TUMOR WITH MITOMYCIN-C N/A 05/01/2018   Procedure: TRANSURETHRAL RESECTION OF BLADDER TUMOR WITH POST OP INSTILLATION OF GEMCITABINE;  Surgeon: Lucas Mallow, MD;  [facility];  Service: Urology;  Laterality: N/A;  . WHIPPLE PROCEDURE N/A 05/18/2016   Procedure: EXPLORATORY LAPAROTOMY;  Surgeon: Stark Klein, MD;  Location: [place];  Service: General;  Laterality:  N/A;     Current Outpatient Medications  Medication Sig Dispense Refill  . acetaminophen (TYLENOL) 500 MG tablet Take 500 mg by mouth every 6 (six) hours as needed for mild pain.    [name] ALPRAZolam (XANAX) 0.5 MG tablet TAKE 1/2-1 TABLET BY MOUTH 3 TIMES A DAY AS NEEDED FOR ANXIETY (Patient taking differently: Take 0.25 mg by mouth at bedtime as needed (for sleep). ) 90 tablet 0  . Biotin 1000 MCG tablet Take 1,000 mcg by mouth daily.    . Cholecalciferol (VITAMIN D-3) 5000 units TABS Take 5,000 Units by mouth daily.    [name] diltiazem (CARDIZEM CD) 180 MG 24 hr capsule TAKE 1 CAPSULE BY MOUTH EVERY DAY 90 capsule 1  . furosemide (LASIX) 20 MG tablet Take 20 mg by mouth every other day.    . gabapentin (NEURONTIN) 300 MG capsule Take 300 mg by mouth 2 (two) times daily.    [name] levothyroxine (SYNTHROID, LEVOTHROID) 100 MCG tablet TAKE 1/2 TABLET BY MOUTH DAILY  BEFORE BREAKFAST. 45 tablet 1  . Magnesium 500 MG TABS Take 500 mg by mouth every evening.    . metoprolol tartrate (LOPRESSOR) 100 MG tablet Take 1 tablet (100 mg total) by mouth 2 (two) times daily. (Patient taking differently: Take 50 mg by mouth 2 (two) times daily. ) 180 tablet 1  . nitroGLYCERIN (NITROSTAT) 0.4 MG SL tablet Place 1 tablet (0.4 mg total) under the tongue every 5 (five) minutes as needed for chest pain (MAX 3 TABLETS). 25 tablet prn  . ondansetron (ZOFRAN-ODT) 4 MG disintegrating tablet Take 8 mg by mouth every 8 (eight) hours as needed for nausea or vomiting.    . ranitidine (ZANTAC) 150 MG tablet Take 150 mg by mouth 2 (two) times daily.    . simethicone (MYLICON) 80 MG chewable tablet Chew 80 mg by mouth every 6 (six) hours as needed for flatulence.    . sucralfate (CARAFATE) 1 g tablet TAKE 1 TABLET (1 G TOTAL) BY MOUTH 4 (FOUR) TIMES DAILY - WITH MEALS AND AT BEDTIME. (Patient taking differently: Take 1 g by mouth 3 (three) times daily. ) 120 tablet 0  . traMADol (ULTRAM) 50 MG tablet Take 1 tablet (50 mg total) by mouth every 6 (six) hours as needed. 10 tablet 0  . warfarin (COUMADIN) 2 MG tablet TAKE 1 TABLET DAILY OR AS DIRECTED 90 tablet 1  . losartan (COZAAR) 50 MG tablet Take 1 tablet (50 mg total) by mouth daily. 90 tablet 3   No current facility-administered medications for this visit.     Allergies:   Adhesive [tape]; Amiodarone; Hydrocodone; Remeron [mirtazapine]; Codeine; Morphine and related; Reglan [metoclopramide]; Requip [ropinirole hcl]; and Zinc    Social History:  The patient  reports that she has never smoked. She has never used smokeless tobacco. She reports that she does not drink alcohol or use drugs.   Family History:  The patient's family history includes Colon cancer (age of onset: 78) in her mother; Heart attack in her father; Heart disease in her father, maternal grandmother, and mother.    ROS:  Please see the history of present illness.    Otherwise, review of systems are positive for none.   All other systems are reviewed and negative.    PHYSICAL EXAM: VS:  BP (!) 186/90   Pulse 78   Ht 4\' 11"  (1.499 m)   Wt 101 lb (45.8 kg)   BMI 20.40 kg/m  , BMI Body mass index is 20.4  kg/m. GENERAL:  Well appearing elderly WF in NAD HEENT:  PERRL, EOMI, sclera are clear. Oropharynx is clear. NECK:  No jugular venous distention, carotid upstroke brisk and symmetric, no bruits, no thyromegaly or adenopathy LUNGS:  Clear to auscultation bilaterally CHEST:  Unremarkable HEART:  RRR,  PMI not displaced or sustained,S1 and S2 within normal limits, no S3, no S4: no clicks, no rubs, gr 2/6 systolic murmur RUSB ABD:  Soft, nontender. BS +, no masses or bruits. No hepatomegaly, no splenomegaly EXT:  2 + pulses throughout, no edema, no cyanosis no clubbing SKIN:  Warm and dry.  No rashes NEURO:  Alert and oriented x 3. Cranial nerves II through XII intact. PSYCH:  Cognitively intact  EKG:  EKG is not ordered today. The ekg ordered today N/A  Recent Labs: 01/02/2018: Magnesium 1.8; TSH 1.47 04/26/2018: ALT 15; BUN 16; Creatinine, Ser 0.94; Hemoglobin 13.8; Platelets 192; Potassium 4.0; Sodium 142    Lipid Panel    Component Value Date/Time   CHOL 128 01/02/2018 1224   TRIG 109 01/02/2018 1224   HDL 42 (L) 01/02/2018 1224   CHOLHDL 3.0 01/02/2018 1224   VLDL 29 03/23/2017 1445   LDLCALC 67 01/02/2018 1224      Wt Readings from Last 3 Encounters:  07/13/18 101 lb (45.8 kg)  06/27/18 99 lb (44.9 kg)  06/26/18 100 lb 8 oz (45.6 kg)     Echo 11/14/15:  Study Conclusions  - Left ventricle: The cavity size was normal. There was mild  concentric hypertrophy. Systolic function was normal. The  estimated ejection fraction was in the range of 60% to 65%. Wall  motion was normal; there were no regional wall motion  abnormalities. The study was not technically sufficient to allow  evaluation of LV diastolic dysfunction due to  atrial  fibrillation. - Aortic valve: Valve mobility was restricted. There was mild to  moderate stenosis. There was moderate regurgitation. Peak  gradient (S): 38 mm Hg. Valve area (VTI): 0.55 cm^2. Valve area  (Vmax): 0.57 cm^2. Valve area (Vmean): 0.59 cm^2. - Aortic root: The aortic root was normal in size. - Ascending aorta: The ascending aorta was normal in size. - Mitral valve: Calcified annulus. Moderately thickened, severely  calcified leaflets . Mobility was restricted. The findings are  consistent with moderate stenosis. There was mild regurgitation.  Valve area by continuity equation (using LVOT flow): 0.82 cm^2. - Left atrium: The atrium was mildly dilated. - Right ventricle: The cavity size was mildly dilated. Wall  thickness was normal. Systolic function was mildly reduced. - Right atrium: The atrium was mildly dilated. - Tricuspid valve: There was moderate regurgitation. - Pulmonary arteries: Systolic pressure was moderately to severely  increased. PA peak pressure: 55 mm Hg (S). - Inferior vena cava: The vessel was dilated. The respirophasic  diameter changes were blunted (< 50%), consistent with elevated  central venous pressure. - Pericardium, extracardiac: There was no pericardial effusion.  Study Highlights     The left ventricular ejection fraction is normal (55-65%).  Nuclear stress EF: 62%.  The study is normal.  Normal stress nuclear study with no ischemia or infarction; EF 62 with normal wall motion; note patient in atrial flutter at time of study.    ASSESSMENT AND PLAN:  1. Paroxysmal atrial fibrillation. Frequency of paroxysmal atrial fibrillation and has improved. Minimally symptomatic.  Rate is controlled.  Mali Vasc score of 5. On Coumadin 2. Tachybradycardia syndrome with functioning dual-chamber pacemaker. 3. Essential hypertension without heart failure.  BP is significantly elevated. On diltiazem, Lasix, Toprol. Will add  losartan 50 mg daily. She has follow up with Dr. Melford Aase in 2 weeks. Can have BMET checked then.  4. Hypercholesterolemia followed by PCP 5. Mild to moderate aortic stenosis 7. history of left nephrectomy secondary to kidney cancer 8. history of endometrial cancer 9. GERD with history of gastric bypass surgery. 10. History of cholesterol embolus to her left eye with central retinal artery occlusion and loss of vision 11. Bladder CA invasive- sounds like she is opting for more palliative approach.      Current medicines are reviewed at length with the patient today.  The patient does not have concerns regarding medicines.  The following changes have been made:  Add losartan 50 mg daily..   Labs/ tests ordered today include:   No orders of the defined types were placed in this encounter.  Follow up in 6 months.   Signed, Tashan Kreitzer [name] MD, [facility]    07/13/2018 11:07 AM    [address], [place], [place]  [address] Phone: [phone]; Fax: [phone]

## 2018-07-13 ENCOUNTER — Encounter: Payer: Self-pay | Admitting: Cardiology

## 2018-07-13 ENCOUNTER — Ambulatory Visit: Payer: Medicare Other | Admitting: Cardiology

## 2018-07-13 VITALS — BP 186/90 | HR 78 | Ht 59.0 in | Wt 101.0 lb

## 2018-07-13 DIAGNOSIS — I10 Essential (primary) hypertension: Secondary | ICD-10-CM

## 2018-07-13 DIAGNOSIS — I480 Paroxysmal atrial fibrillation: Secondary | ICD-10-CM | POA: Diagnosis not present

## 2018-07-13 DIAGNOSIS — Z950 Presence of cardiac pacemaker: Secondary | ICD-10-CM | POA: Diagnosis not present

## 2018-07-13 DIAGNOSIS — I495 Sick sinus syndrome: Secondary | ICD-10-CM | POA: Diagnosis not present

## 2018-07-13 MED ORDER — LOSARTAN POTASSIUM 50 MG PO TABS: 50.0000 mg | ORAL_TABLET | Freq: Every day | ORAL | 3 refills | Status: DC

## 2018-07-13 NOTE — Patient Instructions (Signed)
Continue your current therapy  We will add losartan 50 mg daily for blood pressure. Keep follow up with Dr. Melford Aase to assess response

## 2018-07-17 ENCOUNTER — Telehealth: Payer: Self-pay | Admitting: Pharmacist

## 2018-07-17 ENCOUNTER — Encounter (HOSPITAL_BASED_OUTPATIENT_CLINIC_OR_DEPARTMENT_OTHER): Payer: Self-pay

## 2018-07-17 NOTE — Telephone Encounter (Signed)
Will proceed without bridge as per below.

## 2018-07-17 NOTE — Pre-Procedure Instructions (Signed)
Left a message for [place], Ms. Runion had an ECHO 11/14/2015 that stated mild to moderate Aortic valve stenosis with a valve area 0.55cm2.  Per [facility] guidelines she falls below the 1cm2. Ms. Rochefort will need to be moved to the main  OR for her procedure.

## 2018-07-17 NOTE — Telephone Encounter (Signed)
-----   Message from [name] [name], MD sent at 07/17/2018 11:36 AM EST ----- Regarding: RE: Procedure I am worried about her bleeding risk with bridging. She has a history of GI bleeding in past. Her stroke was a retinal artery occlusion. I would favor not bridging her.   [name] MD, [facility]  ----- Message ----- From: Erskine Emery, [facility] Sent: 07/17/2018   9:34 AM EST To: [name] [name], MD Subject: Procedure                                      Dr. [name],  Pt's daughter called to inform that she is holding warfarin for procedure on Friday. She states that procedure provider told her that ok to hold without Lovenox bridge. She does have a history of a stroke and per our protocol would meet criteria for bridging. I do not see documentation that she has been cleared without a bridge by our office so I just wanted to run it by you for recs on holding without lovenox. Thank you!  Georgina Peer

## 2018-07-18 NOTE — Patient Instructions (Addendum)
[name]  07/18/2018   Your procedure is scheduled on: 07-21-18  Report to [facility] Main  Entrance  Report to admitting at  1230 PM   Call this number if you have problems the morning of surgery [phone]   Remember: Do not eat food  :After Midnight. CLEAR LIQUIDS FROM MIDNIGHT UNTIL 830 AM DAY OF SURGERY. NOTHING BY MOUTH AFTER 830 AM DAY OF SURGERY. BRUSH YOUR TEETH MORNING OF SURGERY AND RINSE YOUR MOUTH OUT, NO CHEWING GUM CANDY OR MINTS.     CLEAR LIQUID DIET   Foods Allowed                                                                     Foods Excluded  Coffee and tea, regular and decaf                             liquids that you cannot  Plain Jell-O in any flavor                                             see through such as: Fruit ices (not with fruit pulp)                                     milk, soups, orange juice  Iced Popsicles                                    All solid food Carbonated beverages, regular and diet                                    Cranberry, grape and apple juices Sports drinks like Gatorade Lightly seasoned clear broth or consume(fat free) Sugar, honey syrup  Sample Menu Breakfast                                Lunch                                     Supper Cranberry juice                    Beef broth                            Chicken broth Jell-O                                     Grape juice  Apple juice Coffee or tea                        Jell-O                                      Popsicle                                                Coffee or tea                        Coffee or tea  _____________________________________________________________________     Take these medicines the morning of surgery with A SIP OF WATER:  GABAPENTIN (NEURONTIN), METOPROLOL TARTRATE, DILTIAZEM (CARDIZEM CD), LEVOTHYROXINE (SYNTHROID), RANITIDINE (ZANTAC)                                 You may not have any metal on your body including hair pins and              piercings  Do not wear jewelry, make-up, lotions, powders or perfumes, deodorant             Do not wear nail polish.  Do not shave  48 hours prior to surgery.            Do not bring valuables to the hospital. [place].  Contacts, dentures or bridgework may not be worn into surgery.  Leave suitcase in the car. After surgery it may be brought to your room.     Patients discharged the day of surgery will not be allowed to drive home.  Name and phone number of your driver: daughter diane cell [phone]  Special Instructions: N/A              Please read over the following fact sheets you were given: _____________________________________________________________________  [facility] - Preparing for Surgery Before surgery, you can play an important role.  Because skin is not sterile, your skin needs to be as free of germs as possible.  You can reduce the number of germs on your skin by washing with CHG (chlorahexidine gluconate) soap before surgery.  CHG is an antiseptic cleaner which kills germs and bonds with the skin to continue killing germs even after washing. Please DO NOT use if you have an allergy to CHG or antibacterial soaps.  If your skin becomes reddened/irritated stop using the CHG and inform your nurse when you arrive at Short Stay. Do not shave (including legs and underarms) for at least 48 hours prior to the first CHG shower.  You may shave your face/neck. Please follow these instructions carefully:  1.  Shower with CHG Soap the night before surgery and the  morning of Surgery.  2.  If you choose to wash your hair, wash your hair first as usual with your  normal  shampoo.  3.  After you shampoo, rinse your hair and body thoroughly to remove the  shampoo.  4.  Use CHG as you would any other liquid soap.  You can apply chg  directly  to the skin and wash                       Gently with a scrungie or clean washcloth.  5.  Apply the CHG Soap to your body ONLY FROM THE NECK DOWN.   Do not use on face/ open                           Wound or open sores. Avoid contact with eyes, ears mouth and genitals (private parts).                       Wash face,  Genitals (private parts) with your normal soap.             6.  Wash thoroughly, paying special attention to the area where your surgery  will be performed.  7.  Thoroughly rinse your body with warm water from the neck down.  8.  DO NOT shower/wash with your normal soap after using and rinsing off  the CHG Soap.                9.  Pat yourself dry with a clean towel.            10.  Wear clean pajamas.            11.  Place clean sheets on your bed the night of your first shower and do not  sleep with pets. Day of Surgery : Do not apply any lotions/deodorants the morning of surgery.  Please wear clean clothes to the hospital/surgery center.  FAILURE TO FOLLOW THESE INSTRUCTIONS MAY RESULT IN THE CANCELLATION OF YOUR SURGERY PATIENT SIGNATURE_________________________________  NURSE SIGNATURE__________________________________  ________________________________________________________________________

## 2018-07-18 NOTE — Progress Notes (Signed)
LOV DR [name] CARDIOLOGY 07-13-18 Epic EKG 04-11-18 Epic LAST DEVICE CHECK NOTE 05-31-18 Epic  CHEST CT 03-21-18 EPIC

## 2018-07-20 ENCOUNTER — Encounter (HOSPITAL_COMMUNITY)
Admission: RE | Admit: 2018-07-20 | Discharge: 2018-07-20 | Disposition: A | Discharge status: Home / Self Care | Payer: Medicare Other | Source: Ambulatory Visit | Attending: Urology | Admitting: Urology

## 2018-07-20 ENCOUNTER — Encounter (HOSPITAL_COMMUNITY): Payer: Self-pay

## 2018-07-20 ENCOUNTER — Other Ambulatory Visit: Payer: Self-pay

## 2018-07-20 ENCOUNTER — Other Ambulatory Visit (HOSPITAL_COMMUNITY): Payer: Medicare Other

## 2018-07-20 DIAGNOSIS — G2581 Restless legs syndrome: Secondary | ICD-10-CM | POA: Diagnosis not present

## 2018-07-20 DIAGNOSIS — Z79899 Other long term (current) drug therapy: Secondary | ICD-10-CM | POA: Diagnosis not present

## 2018-07-20 DIAGNOSIS — I129 Hypertensive chronic kidney disease with stage 1 through stage 4 chronic kidney disease, or unspecified chronic kidney disease: Secondary | ICD-10-CM | POA: Diagnosis not present

## 2018-07-20 DIAGNOSIS — K219 Gastro-esophageal reflux disease without esophagitis: Secondary | ICD-10-CM | POA: Insufficient documentation

## 2018-07-20 DIAGNOSIS — Z8673 Personal history of transient ischemic attack (TIA), and cerebral infarction without residual deficits: Secondary | ICD-10-CM | POA: Insufficient documentation

## 2018-07-20 DIAGNOSIS — E785 Hyperlipidemia, unspecified: Secondary | ICD-10-CM | POA: Diagnosis not present

## 2018-07-20 DIAGNOSIS — Z903 Acquired absence of stomach [part of]: Secondary | ICD-10-CM | POA: Insufficient documentation

## 2018-07-20 DIAGNOSIS — C679 Malignant neoplasm of bladder, unspecified: Secondary | ICD-10-CM | POA: Insufficient documentation

## 2018-07-20 DIAGNOSIS — Z888 Allergy status to other drugs, medicaments and biological substances status: Secondary | ICD-10-CM | POA: Insufficient documentation

## 2018-07-20 DIAGNOSIS — I2510 Atherosclerotic heart disease of native coronary artery without angina pectoris: Secondary | ICD-10-CM | POA: Insufficient documentation

## 2018-07-20 DIAGNOSIS — Z90710 Acquired absence of both cervix and uterus: Secondary | ICD-10-CM | POA: Insufficient documentation

## 2018-07-20 DIAGNOSIS — Z01818 Encounter for other preprocedural examination: Secondary | ICD-10-CM

## 2018-07-20 DIAGNOSIS — Z85028 Personal history of other malignant neoplasm of stomach: Secondary | ICD-10-CM | POA: Diagnosis not present

## 2018-07-20 DIAGNOSIS — I480 Paroxysmal atrial fibrillation: Secondary | ICD-10-CM | POA: Insufficient documentation

## 2018-07-20 DIAGNOSIS — F419 Anxiety disorder, unspecified: Secondary | ICD-10-CM | POA: Insufficient documentation

## 2018-07-20 DIAGNOSIS — Z950 Presence of cardiac pacemaker: Secondary | ICD-10-CM | POA: Diagnosis not present

## 2018-07-20 DIAGNOSIS — N189 Chronic kidney disease, unspecified: Secondary | ICD-10-CM | POA: Insufficient documentation

## 2018-07-20 DIAGNOSIS — Z85528 Personal history of other malignant neoplasm of kidney: Secondary | ICD-10-CM | POA: Diagnosis not present

## 2018-07-20 DIAGNOSIS — E039 Hypothyroidism, unspecified: Secondary | ICD-10-CM | POA: Diagnosis not present

## 2018-07-20 DIAGNOSIS — Z885 Allergy status to narcotic agent status: Secondary | ICD-10-CM | POA: Insufficient documentation

## 2018-07-20 DIAGNOSIS — Z905 Acquired absence of kidney: Secondary | ICD-10-CM | POA: Diagnosis not present

## 2018-07-20 LAB — BASIC METABOLIC PANEL
ANION GAP: 9 (ref 5–15)
BUN: 14 mg/dL (ref 8–23)
CHLORIDE: 110 mmol/L (ref 98–111)
CO2: 25 mmol/L (ref 22–32)
Calcium: 8.8 mg/dL — ABNORMAL LOW (ref 8.9–10.3)
Creatinine, Ser: 1.17 mg/dL — ABNORMAL HIGH (ref 0.44–1.00)
GFR calc Af Amer: 49 mL/min — ABNORMAL LOW (ref >60)
GFR, EST NON AFRICAN AMERICAN: 42 mL/min — AB (ref >60)
GLUCOSE: 80 mg/dL (ref 70–99)
POTASSIUM: 3.5 mmol/L (ref 3.5–5.1)
Sodium: 144 mmol/L (ref 135–145)

## 2018-07-20 LAB — CBC
HEMATOCRIT: 40.3 % (ref 36.0–46.0)
HEMOGLOBIN: 13.2 g/dL (ref 12.0–15.0)
MCH: 32.1 pg (ref 26.0–34.0)
MCHC: 32.8 g/dL (ref 30.0–36.0)
MCV: 98.1 fL (ref 80.0–100.0)
Platelets: 171 10*3/uL (ref 150–400)
RBC: 4.11 MIL/uL (ref 3.87–5.11)
RDW: 13.3 % (ref 11.5–15.5)
WBC: 6.5 10*3/uL (ref 4.0–10.5)
nRBC: 0 % (ref 0.0–0.2)

## 2018-07-20 LAB — NO BLOOD PRODUCTS

## 2018-07-20 LAB — PROTIME-INR
INR: 1.07
Prothrombin Time: 13.8 seconds (ref 11.4–15.2)

## 2018-07-20 MED ORDER — GENTAMICIN SULFATE 40 MG/ML IJ SOLN
5.0000 mg/kg | INTRAVENOUS | Status: AC
  Administered 2018-07-21: 230 mg via INTRAVENOUS
  Filled 2018-07-20: qty 5.75

## 2018-07-20 NOTE — Progress Notes (Signed)
Blood refusal faxed to [facility] and [place] [facility], fax confirmation received and placed on patient chart

## 2018-07-21 ENCOUNTER — Encounter (HOSPITAL_COMMUNITY)
Admission: RE | Disposition: A | Discharge status: Home / Self Care | Payer: Self-pay | Source: Ambulatory Visit | Attending: Urology

## 2018-07-21 ENCOUNTER — Ambulatory Visit (HOSPITAL_COMMUNITY): Payer: Medicare Other | Admitting: Anesthesiology

## 2018-07-21 ENCOUNTER — Ambulatory Visit (HOSPITAL_COMMUNITY): Payer: Medicare Other

## 2018-07-21 ENCOUNTER — Encounter (HOSPITAL_COMMUNITY): Payer: Self-pay | Admitting: *Deleted

## 2018-07-21 ENCOUNTER — Ambulatory Visit (HOSPITAL_COMMUNITY)
Admission: RE | Admit: 2018-07-21 | Discharge: 2018-07-21 | Disposition: A | Discharge status: Home / Self Care | Payer: Medicare Other | Source: Ambulatory Visit | Attending: Urology | Admitting: Urology

## 2018-07-21 DIAGNOSIS — Z905 Acquired absence of kidney: Secondary | ICD-10-CM | POA: Diagnosis not present

## 2018-07-21 DIAGNOSIS — N183 Chronic kidney disease, stage 3 (moderate): Secondary | ICD-10-CM | POA: Diagnosis not present

## 2018-07-21 DIAGNOSIS — Z8673 Personal history of transient ischemic attack (TIA), and cerebral infarction without residual deficits: Secondary | ICD-10-CM | POA: Diagnosis not present

## 2018-07-21 DIAGNOSIS — G2581 Restless legs syndrome: Secondary | ICD-10-CM | POA: Diagnosis not present

## 2018-07-21 DIAGNOSIS — I2510 Atherosclerotic heart disease of native coronary artery without angina pectoris: Secondary | ICD-10-CM | POA: Diagnosis not present

## 2018-07-21 DIAGNOSIS — E039 Hypothyroidism, unspecified: Secondary | ICD-10-CM | POA: Diagnosis not present

## 2018-07-21 DIAGNOSIS — Z885 Allergy status to narcotic agent status: Secondary | ICD-10-CM | POA: Diagnosis not present

## 2018-07-21 DIAGNOSIS — Z8551 Personal history of malignant neoplasm of bladder: Secondary | ICD-10-CM | POA: Diagnosis not present

## 2018-07-21 DIAGNOSIS — C678 Malignant neoplasm of overlapping sites of bladder: Secondary | ICD-10-CM | POA: Diagnosis not present

## 2018-07-21 DIAGNOSIS — Z85028 Personal history of other malignant neoplasm of stomach: Secondary | ICD-10-CM | POA: Diagnosis not present

## 2018-07-21 DIAGNOSIS — Z79899 Other long term (current) drug therapy: Secondary | ICD-10-CM | POA: Diagnosis not present

## 2018-07-21 DIAGNOSIS — I129 Hypertensive chronic kidney disease with stage 1 through stage 4 chronic kidney disease, or unspecified chronic kidney disease: Secondary | ICD-10-CM | POA: Diagnosis not present

## 2018-07-21 DIAGNOSIS — N189 Chronic kidney disease, unspecified: Secondary | ICD-10-CM | POA: Diagnosis not present

## 2018-07-21 DIAGNOSIS — K219 Gastro-esophageal reflux disease without esophagitis: Secondary | ICD-10-CM | POA: Diagnosis not present

## 2018-07-21 DIAGNOSIS — E785 Hyperlipidemia, unspecified: Secondary | ICD-10-CM | POA: Diagnosis not present

## 2018-07-21 DIAGNOSIS — Z888 Allergy status to other drugs, medicaments and biological substances status: Secondary | ICD-10-CM | POA: Diagnosis not present

## 2018-07-21 DIAGNOSIS — C679 Malignant neoplasm of bladder, unspecified: Secondary | ICD-10-CM | POA: Diagnosis not present

## 2018-07-21 DIAGNOSIS — Z85528 Personal history of other malignant neoplasm of kidney: Secondary | ICD-10-CM | POA: Diagnosis not present

## 2018-07-21 DIAGNOSIS — I480 Paroxysmal atrial fibrillation: Secondary | ICD-10-CM | POA: Diagnosis not present

## 2018-07-21 DIAGNOSIS — Z903 Acquired absence of stomach [part of]: Secondary | ICD-10-CM | POA: Diagnosis not present

## 2018-07-21 DIAGNOSIS — Z950 Presence of cardiac pacemaker: Secondary | ICD-10-CM | POA: Diagnosis not present

## 2018-07-21 HISTORY — PX: TRANSURETHRAL RESECTION OF BLADDER TUMOR: SHX2575

## 2018-07-21 HISTORY — DX: Cardiomegaly: I51.7

## 2018-07-21 HISTORY — PX: CYSTOSCOPY W/ RETROGRADES: SHX1426

## 2018-07-21 SURGERY — TURBT (TRANSURETHRAL RESECTION OF BLADDER TUMOR)
Anesthesia: General | Laterality: Right

## 2018-07-21 MED ORDER — FENTANYL CITRATE (PF) 100 MCG/2ML IJ SOLN
INTRAMUSCULAR | Status: AC
  Filled 2018-07-21: qty 2

## 2018-07-21 MED ORDER — HYDROMORPHONE HCL 1 MG/ML IJ SOLN: 0.2500 mg | INTRAMUSCULAR | Status: DC | PRN

## 2018-07-21 MED ORDER — IOHEXOL 300 MG/ML  SOLN
INTRAMUSCULAR | Status: DC | PRN
  Administered 2018-07-21: 6 mL via URETHRAL

## 2018-07-21 MED ORDER — PROPOFOL 10 MG/ML IV BOLUS
INTRAVENOUS | Status: AC
  Filled 2018-07-21: qty 20

## 2018-07-21 MED ORDER — ACETAMINOPHEN 10 MG/ML IV SOLN
1000.0000 mg | Freq: Once | INTRAVENOUS | Status: AC
  Administered 2018-07-21: 1000 mg via INTRAVENOUS

## 2018-07-21 MED ORDER — OXYCODONE HCL 5 MG/5ML PO SOLN
5.0000 mg | Freq: Once | ORAL | Status: DC | PRN
  Filled 2018-07-21: qty 5

## 2018-07-21 MED ORDER — 0.9 % SODIUM CHLORIDE (POUR BTL) OPTIME
TOPICAL | Status: DC | PRN
  Administered 2018-07-21: 1000 mL

## 2018-07-21 MED ORDER — PROMETHAZINE HCL 25 MG/ML IJ SOLN: 6.2500 mg | INTRAMUSCULAR | Status: DC | PRN

## 2018-07-21 MED ORDER — ACETAMINOPHEN 10 MG/ML IV SOLN
INTRAVENOUS | Status: AC
  Filled 2018-07-21: qty 100

## 2018-07-21 MED ORDER — LACTATED RINGERS IV SOLN
INTRAVENOUS | Status: DC
  Administered 2018-07-21: 13:00:00 via INTRAVENOUS

## 2018-07-21 MED ORDER — PHENYLEPHRINE 40 MCG/ML (10ML) SYRINGE FOR IV PUSH (FOR BLOOD PRESSURE SUPPORT)
PREFILLED_SYRINGE | INTRAVENOUS | Status: DC | PRN
  Administered 2018-07-21 (×2): 80 ug via INTRAVENOUS

## 2018-07-21 MED ORDER — LIDOCAINE 2% (20 MG/ML) 5 ML SYRINGE
INTRAMUSCULAR | Status: AC
  Filled 2018-07-21: qty 5

## 2018-07-21 MED ORDER — LIDOCAINE 2% (20 MG/ML) 5 ML SYRINGE
INTRAMUSCULAR | Status: DC | PRN
  Administered 2018-07-21: 100 mg via INTRAVENOUS

## 2018-07-21 MED ORDER — DEXAMETHASONE SODIUM PHOSPHATE 10 MG/ML IJ SOLN
INTRAMUSCULAR | Status: DC | PRN
  Administered 2018-07-21: 10 mg via INTRAVENOUS

## 2018-07-21 MED ORDER — FENTANYL CITRATE (PF) 100 MCG/2ML IJ SOLN
INTRAMUSCULAR | Status: DC | PRN
  Administered 2018-07-21: 25 ug via INTRAVENOUS

## 2018-07-21 MED ORDER — PROPOFOL 10 MG/ML IV BOLUS
INTRAVENOUS | Status: DC | PRN
  Administered 2018-07-21: 100 mg via INTRAVENOUS

## 2018-07-21 MED ORDER — OXYCODONE HCL 5 MG PO TABS: 5.0000 mg | ORAL_TABLET | Freq: Once | ORAL | Status: DC | PRN

## 2018-07-21 MED ORDER — ONDANSETRON HCL 4 MG/2ML IJ SOLN
INTRAMUSCULAR | Status: DC | PRN
  Administered 2018-07-21: 4 mg via INTRAVENOUS

## 2018-07-21 MED ORDER — SODIUM CHLORIDE 0.9 % IR SOLN
Status: DC | PRN
  Administered 2018-07-21: 3000 mL via INTRAVESICAL

## 2018-07-21 SURGICAL SUPPLY — 31 items
BAG URINE DRAINAGE (UROLOGICAL SUPPLIES) IMPLANT
BAG URO CATCHER STRL LF (MISCELLANEOUS) ×4 IMPLANT
BASKET LASER NITINOL 1.9FR (BASKET) IMPLANT
CATH INTERMIT  6FR 70CM (CATHETERS) ×4 IMPLANT
CLOTH BEACON ORANGE TIMEOUT ST (SAFETY) ×4 IMPLANT
COVER WAND RF STERILE (DRAPES) IMPLANT
ELECT REM PT RETURN 15FT ADLT (MISCELLANEOUS) IMPLANT
EVACUATOR MICROVAS BLADDER (UROLOGICAL SUPPLIES) IMPLANT
EXTRACTOR STONE 1.7FRX115CM (UROLOGICAL SUPPLIES) IMPLANT
FIBER LASER FLEXIVA 1000 (UROLOGICAL SUPPLIES) IMPLANT
FIBER LASER FLEXIVA 365 (UROLOGICAL SUPPLIES) IMPLANT
FIBER LASER FLEXIVA 550 (UROLOGICAL SUPPLIES) IMPLANT
FIBER LASER TRAC TIP (UROLOGICAL SUPPLIES) IMPLANT
GLOVE BIOGEL M STRL SZ7.5 (GLOVE) ×4 IMPLANT
GOWN STRL REUS W/TWL LRG LVL3 (GOWN DISPOSABLE) ×4 IMPLANT
GUIDEWIRE ANG ZIPWIRE 038X150 (WIRE) ×8 IMPLANT
GUIDEWIRE STR DUAL SENSOR (WIRE) ×4 IMPLANT
IV NS 1000ML (IV SOLUTION)
IV NS 1000ML BAXH (IV SOLUTION) IMPLANT
LOOP CUT BIPOLAR 24F LRG (ELECTROSURGICAL) ×4 IMPLANT
MANIFOLD NEPTUNE II (INSTRUMENTS) ×4 IMPLANT
PACK CYSTO (CUSTOM PROCEDURE TRAY) ×4 IMPLANT
SET ASPIRATION TUBING (TUBING) IMPLANT
SHEATH URETERAL 12FRX28CM (UROLOGICAL SUPPLIES) IMPLANT
SHEATH URETERAL 12FRX35CM (MISCELLANEOUS) IMPLANT
SYR CONTROL 10ML LL (SYRINGE) ×4 IMPLANT
SYRINGE IRR TOOMEY STRL 70CC (SYRINGE) IMPLANT
TUBE FEEDING 8FR 16IN STR KANG (MISCELLANEOUS) ×4 IMPLANT
TUBING CONNECTING 10 (TUBING) ×3 IMPLANT
TUBING CONNECTING 10' (TUBING) ×1
TUBING UROLOGY SET (TUBING) ×4 IMPLANT

## 2018-07-21 NOTE — Progress Notes (Signed)
Pt does not need to void prior to discharge per Dr Tresa Moore

## 2018-07-21 NOTE — Transfer of Care (Signed)
Immediate Anesthesia Transfer of Care Note  Patient: [name]  Procedure(s) Performed: TRANSURETHRAL RESECTION OF BLADDER TUMOR (TURBT) (N/A ) CYSTOSCOPY WITH RIGHT RETROGRADE PYELOGRAM (Right )  Patient Location: PACU  Anesthesia Type:General  Level of Consciousness: sedated  Airway & Oxygen Therapy: Patient Spontanous Breathing and Patient connected to face mask oxygen  Post-op Assessment: Report given to RN and Post -op Vital signs reviewed and stable  Post vital signs: Reviewed and stable  Last Vitals:  Vitals Value Taken Time  BP    Temp    Pulse 61 07/21/2018  4:01 PM  Resp 16 07/21/2018  4:01 PM  SpO2 100 % 07/21/2018  4:01 PM  Vitals shown include unvalidated device data.  Last Pain:  Vitals:   07/21/18 1242  TempSrc:   PainSc: 0-No pain         Complications: No apparent anesthesia complications

## 2018-07-21 NOTE — Anesthesia Procedure Notes (Signed)
Procedure Name: LMA Insertion Date/Time: 07/21/2018 3:19 PM Performed by: Lind Covert, CRNA Pre-anesthesia Checklist: Patient identified, Emergency Drugs available, Suction available, Patient being monitored and Timeout performed Patient Re-evaluated:Patient Re-evaluated prior to induction Oxygen Delivery Method: Circle system utilized Preoxygenation: Pre-oxygenation with 100% oxygen Induction Type: IV induction LMA: LMA inserted LMA Size: 3.0 Tube type: Oral Number of attempts: 1 Placement Confirmation: positive ETCO2 and breath sounds checked- equal and bilateral Tube secured with: Tape Dental Injury: Teeth and Oropharynx as per pre-operative assessment

## 2018-07-21 NOTE — H&P (Signed)
[name] is an 84 y.o. female.    Chief Complaint: Pre-op RE-staging Transurethral Resection of Bladder Tumor  HPI:   1 - Recurrent Multifocal Urothelial Carcinoma -  2003 - LEFT nephro-ureterectomy by Risa Grill  04/2018 - T2G3 with micropapillary features 2cm bladder cancer by TURBT by Dr. Gloriann Loan, CT clinically localized, Cr <1. Opinion form Shadad not cisplatinum candidate / consider carbo-Gim if primary XRT-chemo only.   We previousls discussed management options of cystectomy / Rt cutaneous ureterostomy; chemo-XRT; or treat as per stage 1.   2 - Solitary Kidney - s/p left nephrectomy 2003. Cr <1.   PMH sig for AFib/Pacemaker/Coumadin, CVA, Left eye blidness, Endometrial Ca s/p prior Hyst/Pelvic XRT, Gastric Cancer s/p piror partial gastrectomy, Open chole. She lives with daughter at baseline but is independent in all ADL's. Her PCP is [name] MD.   Today " [name] " is seen to proceed with restaging TURBT for high grade bladder cancer. No interval fevers. Most recent UA without infectious parameters.      Past Medical History:  Diagnosis Date  . Adenocarcinoma of stomach ([place]) 11/11/2015   gastric mass on egd  . Anal fissure   . Anemia    hx 3/17 iron infusion, on aranesp and injected B12 since 11/2015.   [name] Anxiety   . Aortic root dilatation ([place])   . Arthritis    oa  . 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 CAD (coronary artery disease)   . Chronic kidney disease    only has one kidney rt lft rem 03 ca     dr. Risa Grill, bladder cancer 05-04-2018  . Elevated LFTs    hepatic steatosis, none recent  . Gastroparesis   . GERD (gastroesophageal reflux disease)   . History of uterine cancer 1980s   treated with hysterectomy, external radiation and radiation seed implants.   [name] HTN (hypertension)   . Hyperlipemia   . Hypothyroidism   . Iron deficiency anemia due to chronic blood loss 11/24/2015  . LVH (left ventricular hypertrophy) 11/14/2015   Mild, noted on ECHO  . Moderate aortic  stenosis   . Neuropathy    right leg  . No blood products 02/27/2016  . Osteomyelitis ([place]) as child  . PAF (paroxysmal atrial fibrillation) ([place])   . PONV (postoperative nausea and vomiting)    long time ago  . Radiation proctitis   . Restless leg syndrome   . Stroke ([place]) 15   partial stroke left legally blind; left eye   . Tachycardia-bradycardia syndrome ([place])    s/p PPM by Dr Doreatha Lew (MDT) 07/03/10    Past Surgical History:  Procedure Laterality Date  . CARDIAC CATHETERIZATION  2008  . CHOLECYSTECTOMY  1970s  . COLONOSCOPY N/A 11/11/2015   Procedure: COLONOSCOPY;  Surgeon: [name], MD;  [facility];  Service: Endoscopy;  Laterality: N/A;  . CYSTOSCOPY W/ RETROGRADES Right 05/01/2018   Procedure: CYSTOSCOPY WITH RIGHT RETROGRADE PYELOGRAM;  Surgeon: Lucas Mallow, MD;  [facility]: [facility];  Service: Urology;  Laterality: Right;  . ESOPHAGOGASTRODUODENOSCOPY N/A 11/11/2015   Procedure: ESOPHAGOGASTRODUODENOSCOPY (EGD);  Surgeon: [name], MD;  Location: [facility];  Service: Endoscopy;  Laterality: N/A;  . ESOPHAGOGASTRODUODENOSCOPY N/A 02/05/2016   Procedure: ESOPHAGOGASTRODUODENOSCOPY (EGD);  Surgeon: Ladene Artist, MD;  Location: [facility]COPY;  Service: Endoscopy;  Laterality: N/A;  . ESOPHAGOGASTRODUODENOSCOPY N/A 04/02/2016   Procedure: ESOPHAGOGASTRODUODENOSCOPY (EGD);  Surgeon: [name], MD;  Location: [facility];  Service: Endoscopy;  Laterality: N/A;  .  ESOPHAGOGASTRODUODENOSCOPY (EGD) WITH PROPOFOL N/A 04/06/2017   Procedure: ESOPHAGOGASTRODUODENOSCOPY (EGD) WITH PROPOFOL;  Surgeon: [name], MD;  [facility];  Service: Endoscopy;  Laterality: N/A;  . GASTRECTOMY N/A 01/15/2016   Procedure: OPEN PARTIAL GASTRECTOMY;  Surgeon: Stark Klein, MD;  Location: [place];  Service: General;  Laterality: N/A;  . GASTRECTOMY Left 02/24/2016   Procedure: OPEN J TUBE;  Surgeon: Stark Klein, MD;  Location: [place];  Service: General;   Laterality: Left;  [name] GASTROJEJUNOSTOMY N/A 05/18/2016   Procedure: ROUX EN Y GASTROJEJUNOSTOMY;  Surgeon: Stark Klein, MD;  Location: [place];  Service: General;  Laterality: N/A;  . I&D KNEE WITH POLY EXCHANGE Right 12/17/2013   Procedure: IRRIGATION AND DEBRIDEMEN RIGHT TOTAL  KNEE WITH POLY EXCHANGE;  Surgeon: Mauri Pole, MD;  [facility];  Service: Orthopedics;  Laterality: Right;  . IR GENERIC HISTORICAL  06/17/2016   IR RADIOLOGIST EVAL & MGMT 06/17/2016 Ascencion Dike, PA-C [facility] INTERV RAD  . LAPAROSCOPY N/A 01/15/2016   Procedure: LAPAROSCOPY DIAGNOSTIC;  Surgeon: Stark Klein, MD;  Location: [place];  Service: General;  Laterality: N/A;  . LYSIS OF ADHESION  05/18/2016   Procedure: LYSIS OF ADHESION;  Surgeon: Stark Klein, MD;  Location: [place];  Service: General;;  . NEPHRECTOMY Left    renal cell cancer  . PACEMAKER INSERTION  07/04/2011   MDT PPM implanted by Dr Doreatha Lew for tachy/brady; managed by Dr Thompson Grayer   . PATELLAR TENDON REPAIR Right 03/06/2014   Procedure: AVULSION WITH PATELLA TENDON REPAIR;  Surgeon: Mauri Pole, MD;  [facility];  Service: Orthopedics;  Laterality: Right;  . PATENT DUCTUS ARTERIOUS REPAIR  ~ 1949  . TOTAL ABDOMINAL HYSTERECTOMY  85  . TOTAL HIP REVISION Right 2009   initial replacment surg  . TOTAL KNEE ARTHROPLASTY  02/07/2012   Procedure: TOTAL KNEE ARTHROPLASTY;  Surgeon: Mauri Pole, MD;  [facility];  Service: Orthopedics;  Laterality: Right;  . TOTAL KNEE REVISION Right 07/29/2014   Procedure: RIGHT KNEE REVISION OF PREVIOUS REPAIR EXTENSOR MECHANISM;  Surgeon: Mauri Pole, MD;  [facility];  Service: Orthopedics;  Laterality: Right;  . TRANSURETHRAL RESECTION OF BLADDER TUMOR WITH MITOMYCIN-C N/A 05/01/2018   Procedure: TRANSURETHRAL RESECTION OF BLADDER TUMOR WITH POST OP INSTILLATION OF GEMCITABINE;  Surgeon: Lucas Mallow, MD;  [facility];  Service: Urology;  Laterality: N/A;  . WHIPPLE PROCEDURE N/A  05/18/2016   Procedure: EXPLORATORY LAPAROTOMY;  Surgeon: Stark Klein, MD;  [facility];  Service: General;  Laterality: N/A;    Family History  Problem Relation Age of Onset  . Colon cancer Mother 40  . Heart disease Mother   . Heart disease Father   . Heart attack Father   . Heart disease Maternal Grandmother    Social History:  reports that she has never smoked. She has never used smokeless tobacco. She reports that she does not drink alcohol or use drugs.  Allergies:  Allergies  Allergen Reactions  . Adhesive [Tape] Other (See Comments)    Tears skin, Please use "paper" tape  . Amiodarone Other (See Comments)     Thyroid and liver and kidney problems  . Hydrocodone Other (See Comments)    HALLLUCINATIONS  . Remeron [Mirtazapine] Other (See Comments)    Hallucinations and make pt loopy  . Codeine Nausea And Vomiting  . Morphine And Related Nausea And Vomiting  . Reglan [Metoclopramide] Hives, Itching and Rash  . Requip [  Ropinirole Hcl] Other (See Comments)    Headache   . Zinc Nausea Only  . Other     NO BLOOD PRODUCTS    No medications prior to admission.    Results for orders placed or performed during the hospital encounter of 07/20/18 (from the past 48 hour(s))  CBC     Status: None   Collection Time: 07/20/18  2:03 PM  Result Value Ref Range   WBC 6.5 4.0 - 10.5 K/uL   RBC 4.11 3.87 - 5.11 MIL/uL   Hemoglobin 13.2 12.0 - 15.0 g/dL   HCT 40.3 36.0 - 46.0 %   MCV 98.1 80.0 - 100.0 fL   MCH 32.1 26.0 - 34.0 pg   MCHC 32.8 30.0 - 36.0 g/dL   RDW 13.3 11.5 - 15.5 %   Platelets 171 150 - 400 K/uL   nRBC 0.0 0.0 - 0.2 %    Comment: Performed at [facility], [place] [address], [place], [place] [address]  Basic metabolic panel     Status: Abnormal   Collection Time: 07/20/18  2:03 PM  Result Value Ref Range   Sodium 144 135 - 145 mmol/L   Potassium 3.5 3.5 - 5.1 mmol/L   Chloride 110 98 - 111 mmol/L   CO2 25 22 - 32 mmol/L   Glucose,  Bld 80 70 - 99 mg/dL   BUN 14 8 - 23 mg/dL   Creatinine, Ser 1.17 (H) 0.44 - 1.00 mg/dL   Calcium 8.8 (L) 8.9 - 10.3 mg/dL   GFR calc non Af Amer 42 (L) >60 mL/min   GFR calc Af Amer 49 (L) >60 mL/min    Comment: (NOTE) The eGFR has been calculated using the CKD EPI equation. This calculation has not been validated in all clinical situations. eGFR's persistently <60 mL/min signify possible Chronic Kidney Disease.    Anion gap 9 5 - 15    Comment: Performed at [facility], [place] [address] [place], [place] [address]  PT- INR at PAT visit (Pre-admission Testing)     Status: None   Collection Time: 07/20/18  2:03 PM  Result Value Ref Range   Prothrombin Time 13.8 11.4 - 15.2 seconds   INR 1.07     Comment: Performed at [facility], [place] [address] [place], [place] [address]  No blood products     Status: None   Collection Time: 07/20/18  3:00 PM  Result Value Ref Range   Transfuse no blood products      TRANSFUSE NO BLOOD PRODUCTS, VERIFIED BY Janyth Pupa RN ON [id number], [phone] Performed at [facility], [place] [address] [place], [place] [address]    No results found.  Review of Systems  Constitutional: Negative.  Negative for chills and fever.  HENT: Negative.   Eyes: Negative.   Respiratory: Negative.   Cardiovascular: Negative.   Gastrointestinal: Negative.   Genitourinary: Negative.   Musculoskeletal: Negative.   Skin: Negative.   Neurological: Negative.   Endo/Heme/Allergies: Negative.   Psychiatric/Behavioral: Negative.     There were no vitals taken for this visit. Physical Exam  Constitutional: She appears well-developed.  HENT:  Head: Normocephalic.  Eyes: Pupils are equal, round, and reactive to light.  Neck: Normal range of motion.  Cardiovascular: Normal rate.  Respiratory: Effort normal.  GI:  Multiple scars w/o hernias.   Genitourinary:  Genitourinary Comments: No CVAT at present.    Musculoskeletal: Normal range of motion.  Neurological: She is  alert.  Skin: Skin is warm.  Psychiatric: She has a normal mood and affect.     Assessment/Plan  Proceed as planned with restaging TURBT / Right retrograde. Risks, benefits, alternatives, exepected peri-op course discussed previously and reiterated today.   Alexis Frock, MD 07/21/2018, 8:21 AM

## 2018-07-21 NOTE — Anesthesia Preprocedure Evaluation (Signed)
Anesthesia Evaluation  Patient identified by MRN, date of birth, ID band Patient awake    Reviewed: Allergy & Precautions, NPO status , Patient's Chart, lab work & pertinent test results  History of Anesthesia Complications (+) PONV  Airway Mallampati: II  TM Distance: >3 FB Neck ROM: Full    Dental no notable dental hx.    Pulmonary neg pulmonary ROS,    Pulmonary exam normal breath sounds clear to auscultation       Cardiovascular hypertension, Pt. on medications and Pt. on home beta blockers + Peripheral Vascular Disease  Normal cardiovascular exam+ pacemaker + Valvular Problems/Murmurs AS  Rhythm:Regular Rate:Normal     Neuro/Psych negative neurological ROS  negative psych ROS   GI/Hepatic Neg liver ROS, GERD  ,gastroparesis   Endo/Other  Hypothyroidism   Renal/GU negative Renal ROS  negative genitourinary   Musculoskeletal negative musculoskeletal ROS (+)   Abdominal   Peds negative pediatric ROS (+)  Hematology On coumadin   Anesthesia Other Findings   Reproductive/Obstetrics negative OB ROS                             Anesthesia Physical  Anesthesia Plan  ASA: III  Anesthesia Plan: General   Post-op Pain Management:    Induction: Intravenous  PONV Risk Score and Plan: 4 or greater and Ondansetron, Dexamethasone, Treatment may vary due to age or medical condition, Midazolam and Scopolamine patch - Pre-op  Airway Management Planned: LMA  Additional Equipment:   Intra-op Plan:   Post-operative Plan: Extubation in OR  Informed Consent: I have reviewed the patients History and Physical, chart, labs and discussed the procedure including the risks, benefits and alternatives for the proposed anesthesia with the patient or authorized representative who has indicated his/her understanding and acceptance.   Dental advisory given  Plan Discussed with: CRNA and  Surgeon  Anesthesia Plan Comments:         Anesthesia Quick Evaluation

## 2018-07-21 NOTE — Brief Op Note (Signed)
07/21/2018  3:53 PM  PATIENT:  [name]  84 y.o. female  PRE-OPERATIVE DIAGNOSIS:  BLADDER CANCER  POST-OPERATIVE DIAGNOSIS:  BLADDER CANCER  PROCEDURE:   TURBT (small), RIGHT retrograde pyelogram  SURGEON:  Surgeon(s) and Role:    * Alexis Frock, MD - Primary  PHYSICIAN ASSISTANT:   ASSISTANTS: none   ANESTHESIA:   general  EBL:  minimal   BLOOD ADMINISTERED:none  DRAINS: none   LOCAL MEDICATIONS USED:  NONE  SPECIMEN:  Source of Specimen:  1- old resection site, 2 - base of old resection site  DISPOSITION OF SPECIMEN:  PATHOLOGY  COUNTS:  YES  TOURNIQUET:  * No tourniquets in log *  DICTATION: .Other Dictation: Dictation Number [id number]  PLAN OF CARE: Discharge to home after PACU  PATIENT DISPOSITION:  PACU - hemodynamically stable.   Delay start of Pharmacological VTE agent (>24hrs) due to surgical blood loss or risk of bleeding: yes

## 2018-07-21 NOTE — Anesthesia Postprocedure Evaluation (Signed)
Anesthesia Post Note  Patient: [name]  Procedure(s) Performed: TRANSURETHRAL RESECTION OF BLADDER TUMOR (TURBT) (N/A ) CYSTOSCOPY WITH RIGHT RETROGRADE PYELOGRAM (Right )     Patient location during evaluation: PACU Anesthesia Type: General Level of consciousness: awake and alert Pain management: pain level controlled Vital Signs Assessment: post-procedure vital signs reviewed and stable Respiratory status: spontaneous breathing, nonlabored ventilation and respiratory function stable Cardiovascular status: blood pressure returned to baseline and stable Postop Assessment: no apparent nausea or vomiting Anesthetic complications: no    Last Vitals:  Vitals:   07/21/18 1645 07/21/18 1654  BP: (!) 149/65 (!) 169/75  Pulse: 62 64  Resp: 12 16  Temp: (!) 36.3 C 36.8 C  SpO2: 99% 99%    Last Pain:  Vitals:   07/21/18 1645  TempSrc:   PainSc: 0-No pain                 Lynda Rainwater

## 2018-07-21 NOTE — Discharge Instructions (Signed)
1 - You may have urinary urgency (bladder spasms) and bloody urine on / off  For up to 2 weeks. This is normal.  2 - Restart coumadin Monday as long as no visible blood in urine.   3 - Call MD or go to ER for fever >102, severe pain / nausea / vomiting not relieved by medications, or acute change in medical status

## 2018-07-22 ENCOUNTER — Encounter (HOSPITAL_COMMUNITY): Payer: Self-pay | Admitting: Urology

## 2018-07-22 NOTE — Op Note (Signed)
[name], GARN MEDICAL RECORD [id number] ACCOUNT [id number] DATE OF BIRTH:10-11-1933 FACILITY: [facility] LOCATION: [facility] PHYSICIAN:Wally Behan, MD  OPERATIVE REPORT  DATE OF PROCEDURE:  07/21/2018  PREOPERATIVE DIAGNOSES:  History of stage II bladder cancer, prior left renal pelvis cancer.  PROCEDURE:   1.  Transection of bladder tumor, volume small   2.  Right retrograde pyelogram   ESTIMATED BLOOD LOSS:  Nil.  COMPLICATIONS:  None.  SPECIMENS:   1.  Old resection site. 2.  Base of the resection site.  FINDINGS: 1.  Absence surgically of prior left ureteral orifice. 2.  Unremarkable right pyelogram. 3.  No evidence of intraluminal tumor in the urinary bladder.  INDICATIONS:  This is a very pleasant 84 year old lady with an unfortunate history of multiple malignancies including left renal pelvis cancer, bladder cancer, gastric cancer, endometrial cancer.  She was found most recently to have a new high grade  variant of bladder cancer and was referred internally for consideration of cystectomy.  Given her age and significant comorbidity additional options were discussed including chemoradiation versus treating as for stage I and she wished to proceed with the  latter.  With this restaging resection was warranted and she presents for this today.  Informed consent was obtained and placed in medical record.  DESCRIPTION OF PROCEDURE:  Patient identified as [name], procedure being transection of bladder tumor and retrograde pyelograms were confirmed.  Procedure timeout was performed.  Intravenous antibiotics administered.  General LMA anesthesia  induced.  The patient was placed into a low lithotomy position.  Sterile field was created by prepping and draping the vagina, introitus, and proximal thighs using iodine.  Cystourethroscopy was performed using 22-French rigid cystoscope with offset  lens.  Inspection of the urinary bladder revealed no diverticula,  calcifications or lesions.  The left ureteral orifice was surgically absent.  The right ureteral orifice was unremarkable.  Old resection site was visualized in an area likely close to the  prior left ureteral orifice.  There is no evidence of intraluminal tumor seen whatsoever.  The right ureteral orifice was cannulated with a sequential catheter and right retrograde pyelogram was obtained.  Right retrograde pyelogram demonstrated a single right ureter. single system right kidney.  No filling defects or narrowing noted.  Next, cold cup biopsy forceps were used to obtain very superficial biopsy of the old resection site at the level of the  urothelium and set aside labeled as old resection site.  Next, a cold cup biopsy forceps were used to obtain deep seromuscular sample.  These were set aside labeled as base of the old resection site.  Next, using resectoscope loop, the base and edges of  this was fulgurated for a distance of approximately 2 cm in all directions.  There was excellent hemostasis.  No evidence of bladder perforation.  It was not felt that catheterization would be warranted.  As such, the bladder was partially empty per  cystoscope and procedure terminated.    The patient tolerated the procedure well.  No immediate perinatal complications.    The patient was taken to postanesthesia care in stable condition.  AN/NUANCE  D:07/21/2018 T:07/22/2018 JOB:[id number]

## 2018-07-24 ENCOUNTER — Encounter: Payer: Self-pay | Admitting: Internal Medicine

## 2018-07-24 ENCOUNTER — Ambulatory Visit (INDEPENDENT_AMBULATORY_CARE_PROVIDER_SITE_OTHER): Payer: Medicare Other | Admitting: Internal Medicine

## 2018-07-24 VITALS — BP 140/84 | HR 72 | Temp 97.5°F | Ht 59.0 in | Wt 99.0 lb

## 2018-07-24 DIAGNOSIS — K219 Gastro-esophageal reflux disease without esophagitis: Secondary | ICD-10-CM

## 2018-07-24 DIAGNOSIS — E559 Vitamin D deficiency, unspecified: Secondary | ICD-10-CM | POA: Diagnosis not present

## 2018-07-24 DIAGNOSIS — R7309 Other abnormal glucose: Secondary | ICD-10-CM

## 2018-07-24 DIAGNOSIS — E782 Mixed hyperlipidemia: Secondary | ICD-10-CM | POA: Diagnosis not present

## 2018-07-24 DIAGNOSIS — I10 Essential (primary) hypertension: Secondary | ICD-10-CM

## 2018-07-24 DIAGNOSIS — E039 Hypothyroidism, unspecified: Secondary | ICD-10-CM | POA: Diagnosis not present

## 2018-07-24 DIAGNOSIS — I482 Chronic atrial fibrillation, unspecified: Secondary | ICD-10-CM

## 2018-07-24 DIAGNOSIS — Z79899 Other long term (current) drug therapy: Secondary | ICD-10-CM

## 2018-07-24 DIAGNOSIS — R7303 Prediabetes: Secondary | ICD-10-CM | POA: Diagnosis not present

## 2018-07-24 NOTE — Progress Notes (Signed)
This very nice 84 y.o. WWF  presents for 6 month follow up with HTN, HLD, Pre-Diabetes and Vitamin D Deficiency.      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     Patient is treated for HTN (1960's)  & BP has been controlled at home. In 2008 she was dx'd with Afib a& started on Coumadin. In 2011, she had a PPM implanted for SSS.  Patient also has CKD3 consequent of her HTCVD.  Today's BP was initially elevated & rechecked at goal -  140/84. Patient has had no complaints of any cardiac type chest pain, palpitations, dyspnea / orthopnea / PND, dizziness, claudication, or dependent edema.     Hyperlipidemia is controlled with diet & meds. Patient denies myalgias or other med SE's. Last Lipids were at goal: Lab Results  Component Value Date   CHOL 128 01/02/2018   HDL 42 (L) 01/02/2018   LDLCALC 67 01/02/2018   TRIG 109 01/02/2018   CHOLHDL 3.0 01/02/2018      Patient was initiated on Thyroid Replacement in the 1990's.      Also, the patient has history of PreDiabetes / Insulin Resistance monitored expectantly since 2012.  She has not had symptoms of reactive hypoglycemia, diabetic polys, paresthesias or visual blurring.  Last A1c Normal & at goal:  Lab Results  Component Value Date   HGBA1C 5.1 01/02/2018      Further, the patient also has history of Vitamin D Deficiency and supplements vitamin D without any suspected side-effects. Last vitamin D was   Lab Results  Component Value Date   VD25OH 6 01/02/2018   Current Outpatient Medications on File Prior to Visit  Medication Sig  . acetaminophen (TYLENOL) 500 MG tablet Take 500-1,000 mg by mouth every 6 (six) hours as needed for mild pain or headache.   . ALPRAZolam (XANAX) 0.5 MG tablet TAKE 1/2-1 TABLET BY MOUTH 3 TIMES A DAY AS NEEDED FOR ANXIETY (Patient  taking differently: Take 0.25 mg by mouth 3 (three) times daily as needed for anxiety or sleep. )  . Biotin 10 MG CAPS Take 10 mg by mouth daily.   . Cholecalciferol (VITAMIN D-3) 5000 units TABS Take 5,000 Units by mouth daily.  [name] diltiazem (CARDIZEM CD) 180 MG 24 hr capsule TAKE 1 CAPSULE BY MOUTH EVERY DAY (Patient taking differently: Take 180 mg by mouth daily. )  . furosemide (LASIX) 20 MG tablet Take 20 mg by mouth every other day.  . gabapentin (NEURONTIN) 300 MG capsule Take 300 mg by mouth 2 (two) times daily. Takes am and lunch  . levothyroxine (SYNTHROID, LEVOTHROID) 100 MCG tablet TAKE 1/2 TABLET BY MOUTH DAILY BEFORE BREAKFAST. (Patient taking differently: Take 50 mcg by mouth daily before breakfast. )  . loperamide (IMODIUM) 2 MG capsule Take by mouth as needed for diarrhea or loose stools. Up to 16 day  . losartan (COZAAR) 50 MG tablet Take 1 tablet (50 mg total) by mouth daily. (Patient taking differently: Take 25 mg by mouth daily. )  . Magnesium 500 MG TABS Take 500 mg by mouth every evening.  . metoprolol tartrate (LOPRESSOR) 100 MG tablet Take 1 tablet (100 mg total) by mouth 2 (two) times daily.  . nitroGLYCERIN (NITROSTAT) 0.4 MG SL tablet Place 1 tablet (0.4 mg total) under the  tongue every 5 (five) minutes as needed for chest pain (MAX 3 TABLETS).  . ondansetron (ZOFRAN-ODT) 8 MG disintegrating tablet Take 8 mg by mouth every 8 (eight) hours as needed for nausea or vomiting.   . ranitidine (ZANTAC) 150 MG tablet Take 150 mg by mouth 2 (two) times daily.  . simethicone (MYLICON) 80 MG chewable tablet Chew 80 mg by mouth every 6 (six) hours as needed for flatulence.  . sucralfate (CARAFATE) 1 g tablet TAKE 1 TABLET (1 G TOTAL) BY MOUTH 4 (FOUR) TIMES DAILY - WITH MEALS AND AT BEDTIME. (Patient taking differently: Take 1 g by mouth 3 (three) times daily. )  . traMADol (ULTRAM) 50 MG tablet Take 1 tablet (50 mg total) by mouth every 6 (six) hours as needed.  . warfarin (COUMADIN)  2 MG tablet TAKE 1 TABLET DAILY OR AS DIRECTED (Patient taking differently: Take 2-3 mg by mouth See admin instructions. Take 3 mg by mouth daily on Monday, Wednesday and Friday. Take 2 mg by mouth daily on all other days.)   No current facility-administered medications on file prior to visit.    Allergies  Allergen Reactions  . Adhesive [Tape] Other (See Comments)    Tears skin, Please use "paper" tape  . Amiodarone Other (See Comments)     Thyroid and liver and kidney problems  . Hydrocodone Other (See Comments)    HALLLUCINATIONS  . Remeron [Mirtazapine] Other (See Comments)    Hallucinations and make pt loopy  . Codeine Nausea And Vomiting  . Morphine And Related Nausea And Vomiting  . Reglan [Metoclopramide] Hives, Itching and Rash  . Requip [Ropinirole Hcl] Other (See Comments)    Headache   . Zinc Nausea Only  . Other     NO BLOOD PRODUCTS   PMHx:   Past Medical History:  Diagnosis Date  . Adenocarcinoma of stomach ([place]) 11/11/2015   gastric mass on egd  . Anal fissure   . Anemia    hx 3/17 iron infusion, on aranesp and injected B12 since 11/2015.   [name] Anxiety   . Aortic root dilatation ([place])   . Arthritis    oa  . 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 CAD (coronary artery disease)   . Chronic kidney disease    only has one kidney rt lft rem 03 ca     dr. Risa Grill, bladder cancer 05-04-2018  . Elevated LFTs    hepatic steatosis, none recent  . Gastroparesis   . GERD (gastroesophageal reflux disease)   . History of uterine cancer 1980s   treated with hysterectomy, external radiation and radiation seed implants.   [name] HTN (hypertension)   . Hyperlipemia   . Hypothyroidism   . Iron deficiency anemia due to chronic blood loss 11/24/2015  . LVH (left ventricular hypertrophy) 11/14/2015   Mild, noted on ECHO  . Moderate aortic stenosis   . Neuropathy    right leg  . No blood products 02/27/2016  . Osteomyelitis ([place]) as child  . PAF (paroxysmal atrial fibrillation) ([place])   . PONV  (postoperative nausea and vomiting)    long time ago  . Presence of permanent cardiac pacemaker   . Radiation proctitis   . Restless leg syndrome   . Stroke ([place]) 15   partial stroke left legally blind; left eye   . Tachycardia-bradycardia syndrome ([place])    s/p PPM by Dr Doreatha Lew (MDT) 07/03/10   Immunization History  Administered Date(s) Administered  . Influenza, High Dose Seasonal PF 07/18/2015,  06/23/2017, 06/27/2018  . Influenza,inj,Quad PF,6+ Mos 05/31/2014, 06/09/2016  . Influenza-Unspecified 09/16/2009, 07/03/2013  . Pneumococcal Conjugate-13 09/06/2002  . Pneumococcal Polysaccharide-23 07/18/2015  . Td 09/06/2002  . Zoster 09/06/2008   Past Surgical History:  Procedure Laterality Date  . CARDIAC CATHETERIZATION  2008  . CHOLECYSTECTOMY  1970s  . COLONOSCOPY N/A 11/11/2015   Procedure: COLONOSCOPY;  Surgeon: [name], MD;  Location: [facility];  Service: Endoscopy;  Laterality: N/A;  . CYSTOSCOPY W/ RETROGRADES Right 05/01/2018   Procedure: CYSTOSCOPY WITH RIGHT RETROGRADE PYELOGRAM;  Surgeon: Lucas Mallow, MD;  Location: [facility];  Service: Urology;  Laterality: Right;  . CYSTOSCOPY W/ RETROGRADES Right 07/21/2018   Procedure: CYSTOSCOPY WITH RIGHT RETROGRADE PYELOGRAM;  Surgeon: Alexis Frock, MD;  Location: [facility];  Service: Urology;  Laterality: Right;  . ESOPHAGOGASTRODUODENOSCOPY N/A 11/11/2015   Procedure: ESOPHAGOGASTRODUODENOSCOPY (EGD);  Surgeon: [name], MD;  Location: [facility];  Service: Endoscopy;  Laterality: N/A;  . ESOPHAGOGASTRODUODENOSCOPY N/A 02/05/2016   Procedure: ESOPHAGOGASTRODUODENOSCOPY (EGD);  Surgeon: Ladene Artist, MD;  Location: [facility];  Service: Endoscopy;  Laterality: N/A;  . ESOPHAGOGASTRODUODENOSCOPY N/A 04/02/2016   Procedure: ESOPHAGOGASTRODUODENOSCOPY (EGD);  Surgeon: [name], MD;  Location: [facility];  Service: Endoscopy;  Laterality: N/A;  . ESOPHAGOGASTRODUODENOSCOPY (EGD) WITH PROPOFOL N/A 04/06/2017     Procedure: ESOPHAGOGASTRODUODENOSCOPY (EGD) WITH PROPOFOL;  Surgeon: [name], MD;  [facility];  Service: Endoscopy;  Laterality: N/A;  . GASTRECTOMY N/A 01/15/2016   Procedure: OPEN PARTIAL GASTRECTOMY;  Surgeon: Stark Klein, MD;  Location: [facility];  Service: General;  Laterality: N/A;  . GASTRECTOMY Left 02/24/2016   Procedure: OPEN J TUBE;  Surgeon: Stark Klein, MD;  Location: [place];  Service: General;  Laterality: Left;  [name] GASTROJEJUNOSTOMY N/A 05/18/2016   Procedure: ROUX EN Y GASTROJEJUNOSTOMY;  Surgeon: Stark Klein, MD;  Location: [facility];  Service: General;  Laterality: N/A;  . I&D KNEE WITH POLY EXCHANGE Right 12/17/2013   Procedure: IRRIGATION AND DEBRIDEMEN RIGHT TOTAL  KNEE WITH POLY EXCHANGE;  Surgeon: Mauri Pole, MD;  Location: [facility];  Service: Orthopedics;  Laterality: Right;  . IR GENERIC HISTORICAL  06/17/2016   IR RADIOLOGIST EVAL & MGMT 06/17/2016 Ascencion Dike, PA-C [facility] INTERV RAD  . LAPAROSCOPY N/A 01/15/2016   Procedure: LAPAROSCOPY DIAGNOSTIC;  Surgeon: Stark Klein, MD;  Location: [facility];  Service: General;  Laterality: N/A;  . LYSIS OF ADHESION  05/18/2016   Procedure: LYSIS OF ADHESION;  Surgeon: Stark Klein, MD;  Location: [facility];  Service: General;;  . NEPHRECTOMY Left    renal cell cancer  . PACEMAKER INSERTION  07/04/2011   [facility] PPM implanted by Dr Doreatha Lew for tachy/brady; managed by Dr Thompson Grayer   . PATELLAR TENDON REPAIR Right 03/06/2014   Procedure: AVULSION WITH PATELLA TENDON REPAIR;  Surgeon: Mauri Pole, MD;  [facility];  Service: Orthopedics;  Laterality: Right;  . PATENT DUCTUS ARTERIOUS REPAIR  ~ 1949  . TOTAL ABDOMINAL HYSTERECTOMY  85  . TOTAL HIP REVISION Right 2009   initial replacment surg  . TOTAL KNEE ARTHROPLASTY  02/07/2012   Procedure: TOTAL KNEE ARTHROPLASTY;  Surgeon: Mauri Pole, MD;  [facility];  Service: Orthopedics;  Laterality: Right;  . TOTAL KNEE REVISION Right 07/29/2014   Procedure: RIGHT  KNEE REVISION OF PREVIOUS REPAIR EXTENSOR MECHANISM;  Surgeon: Mauri Pole, MD;  [facility];  Service: Orthopedics;  Laterality: Right;  . TRANSURETHRAL RESECTION OF BLADDER TUMOR  N/A 07/21/2018   Procedure: TRANSURETHRAL RESECTION OF BLADDER TUMOR (TURBT);  Surgeon: Alexis Frock, MD;  [facility];  Service: Urology;  Laterality: N/A;  . TRANSURETHRAL RESECTION OF BLADDER TUMOR WITH MITOMYCIN-C N/A 05/01/2018   Procedure: TRANSURETHRAL RESECTION OF BLADDER TUMOR WITH POST OP INSTILLATION OF GEMCITABINE;  Surgeon: Lucas Mallow, MD;  [facility];  Service: Urology;  Laterality: N/A;  . WHIPPLE PROCEDURE N/A 05/18/2016   Procedure: EXPLORATORY LAPAROTOMY;  Surgeon: Stark Klein, MD;  Location: [facility];  Service: General;  Laterality: N/A;   FHx:    Reviewed / unchanged  SHx:    Reviewed / unchanged   Systems Review:  Constitutional: Denies fever, chills, wt changes, headaches, insomnia, fatigue, night sweats, change in appetite. Eyes: Denies redness, blurred vision, diplopia, discharge, itchy, watery eyes.  ENT: Denies discharge, congestion, post nasal drip, epistaxis, sore throat, earache, hearing loss, dental pain, tinnitus, vertigo, sinus pain, snoring.  CV: Denies chest pain, palpitations, irregular heartbeat, syncope, dyspnea, diaphoresis, orthopnea, PND, claudication or edema. Respiratory: denies cough, dyspnea, DOE, pleurisy, hoarseness, laryngitis, wheezing.  Gastrointestinal: Denies dysphagia, odynophagia, heartburn, reflux, water brash, abdominal pain or cramps, nausea, vomiting, bloating, diarrhea, constipation, hematemesis, melena, hematochezia  or hemorrhoids. Genitourinary: Denies dysuria, frequency, urgency, nocturia, hesitancy, discharge, hematuria or flank pain. Musculoskeletal: Denies arthralgias, myalgias, stiffness, jt. swelling, pain, limping or strain/sprain.  Skin: Denies pruritus, rash, hives, warts, acne, eczema or change in skin lesion(s). Neuro:  No weakness, tremor, incoordination, spasms, paresthesia or pain. Psychiatric: Denies confusion, memory loss or sensory loss. Endo: Denies change in weight, skin or hair change.  Heme/Lymph: No excessive bleeding, bruising or enlarged lymph nodes.  Physical Exam  BP 140/84   Pulse 72   Temp (!) 97.5 F (36.4 C)   Ht 4\' 11"  (1.499 m)   Wt 99 lb (44.9 kg)   SpO2 93%   BMI 20.00 kg/m   Appears  well nourished, well groomed  and in no distress.  Eyes: PERRLA, EOMs, conjunctiva no swelling or erythema. Sinuses: No frontal/maxillary tenderness ENT/Mouth: EAC's clear, TM's nl w/o erythema, bulging. Nares clear w/o erythema, swelling, exudates. Oropharynx clear without erythema or exudates. Oral hygiene is good. Tongue normal, non obstructing. Hearing intact.  Neck: Supple. Thyroid not palpable. Car 2+/2+ without bruits, nodes or JVD. Chest: Respirations nl with BS clear & equal w/o rales, rhonchi, wheezing or stridor.  Cor: Heart sounds normal w/ regular rate and rhythm without sig. murmurs, gallops, clicks or rubs. Peripheral pulses normal and equal  without edema.  Abdomen: Soft & bowel sounds normal. Non-tender w/o guarding, rebound, hernias, masses or organomegaly.  Lymphatics: Unremarkable.  Musculoskeletal: Full ROM all peripheral extremities, joint stability, 5/5 strength and normal gait.  Skin: Warm, dry without exposed rashes, lesions or ecchymosis apparent.  Neuro: Cranial nerves intact, reflexes equal bilaterally. Sensory-motor testing grossly intact. Tendon reflexes grossly intact.  Pysch: Alert & oriented x 3.  Insight and judgement nl & appropriate. No ideations.  Assessment and Plan:  1. Essential hypertension  - Continue medication, monitor blood pressure at home.  - Continue DASH diet.  Reminder to go to the ER if any CP,  SOB, nausea, dizziness, severe HA, changes vision/speech.  - CBC with Differential/Platelet - COMPLETE METABOLIC PANEL WITH GFR - Magnesium -  TSH  2. H - Continue diet/meds, exercise,& lifestyle modifications.  - Continue monitor periodic cholesterol/liver & renal functions  yperlipidemia, mixed  - TSH  3. Abnormal glucose  - Continue diet, exercise,  - lifestyle  modifications.  - Monitor appropriate labs.  - Hemoglobin A1c - Insulin, random  4. Vitamin D deficiency  - Continue supplementation.  - VITAMIN D 25 Hydroxyl  5. Prediabetes  - Hemoglobin A1c - Insulin, random  6. Hypothyroidism  - TSH  7. Chronic atrial fibrillation  - TSH  8. Gastroesophageal reflux disease  - CBC with Differential/Platelet  9. Medication management  - CBC with Differential/Platelet - COMPLETE METABOLIC PANEL WITH GFR - Magnesium - TSH - Hemoglobin A1c - Insulin, random - VITAMIN D 25 Hydroxyl        Discussed  regular exercise, BP monitoring, weight control to achieve/maintain BMI less than 25 and discussed med and SE's. Recommended labs to assess and monitor clinical status with further disposition pending results of labs. Over 30 minutes of exam, counseling, chart review was performed.

## 2018-07-24 NOTE — Patient Instructions (Signed)

## 2018-07-25 LAB — COMPLETE METABOLIC PANEL WITH GFR
AG Ratio: 1.6 (calc) (ref 1.0–2.5)
ALT: 32 U/L — ABNORMAL HIGH (ref 6–29)
AST: 42 U/L — AB (ref 10–35)
Albumin: 3.9 g/dL (ref 3.6–5.1)
Alkaline phosphatase (APISO): 101 U/L (ref 33–130)
BUN/Creatinine Ratio: 23 (calc) — ABNORMAL HIGH (ref 6–22)
BUN: 21 mg/dL (ref 7–25)
CALCIUM: 9.3 mg/dL (ref 8.6–10.4)
CO2: 29 mmol/L (ref 20–32)
CREATININE: 0.93 mg/dL — AB (ref 0.60–0.88)
Chloride: 110 mmol/L (ref 98–110)
GFR, EST NON AFRICAN AMERICAN: 57 mL/min/{1.73_m2} — AB (ref >=60)
GFR, Est African American: 66 mL/min/{1.73_m2} (ref >=60)
GLOBULIN: 2.4 g/dL (ref 1.9–3.7)
GLUCOSE: 78 mg/dL (ref 65–99)
Potassium: 4.6 mmol/L (ref 3.5–5.3)
SODIUM: 146 mmol/L (ref 135–146)
Total Bilirubin: 0.6 mg/dL (ref 0.2–1.2)
Total Protein: 6.3 g/dL (ref 6.1–8.1)

## 2018-07-25 LAB — CBC WITH DIFFERENTIAL/PLATELET
BASOS ABS: 41 {cells}/uL (ref 0–200)
Basophils Relative: 0.5 %
Eosinophils Absolute: 156 cells/uL (ref 15–500)
Eosinophils Relative: 1.9 %
HEMATOCRIT: 42.4 % (ref 35.0–45.0)
Hemoglobin: 14.4 g/dL (ref 11.7–15.5)
LYMPHS ABS: 1706 {cells}/uL (ref 850–3900)
MCH: 32.5 pg (ref 27.0–33.0)
MCHC: 34 g/dL (ref 32.0–36.0)
MCV: 95.7 fL (ref 80.0–100.0)
MPV: 10.6 fL (ref 7.5–12.5)
Monocytes Relative: 6.8 %
NEUTROS PCT: 70 %
Neutro Abs: 5740 cells/uL (ref 1500–7800)
Platelets: 173 10*3/uL (ref 140–400)
RBC: 4.43 10*6/uL (ref 3.80–5.10)
RDW: 12.2 % (ref 11.0–15.0)
Total Lymphocyte: 20.8 %
WBC: 8.2 10*3/uL (ref 3.8–10.8)
WBCMIX: 558 {cells}/uL (ref 200–950)

## 2018-07-25 LAB — INSULIN, RANDOM: INSULIN: 2.9 u[IU]/mL (ref 2.0–19.6)

## 2018-07-25 LAB — MAGNESIUM: MAGNESIUM: 1.7 mg/dL (ref 1.5–2.5)

## 2018-07-25 LAB — VITAMIN D 25 HYDROXY (VIT D DEFICIENCY, FRACTURES): Vit D, 25-Hydroxy: 46 ng/mL (ref 30–100)

## 2018-07-25 LAB — HEMOGLOBIN A1C
EAG (MMOL/L): 5.4 (calc)
HEMOGLOBIN A1C: 5 %{Hb} (ref <5.7)
Mean Plasma Glucose: 97 (calc)

## 2018-07-25 LAB — TSH: TSH: 0.93 m[IU]/L (ref 0.40–4.50)

## 2018-07-28 ENCOUNTER — Ambulatory Visit: Payer: Medicare Other | Admitting: Pharmacist

## 2018-07-28 DIAGNOSIS — Z7901 Long term (current) use of anticoagulants: Secondary | ICD-10-CM

## 2018-07-28 DIAGNOSIS — I4891 Unspecified atrial fibrillation: Secondary | ICD-10-CM

## 2018-07-28 LAB — POCT INR: INR: 1.5 — AB (ref 2.0–3.0)

## 2018-07-28 NOTE — Patient Instructions (Signed)
Description   Take 2 tablets today and 1.5 tablets tomorrow, then continue taking 1 tablet daily except 1.5 tablets on Mondays, Wednesdays, and Fridays.  Recheck INR in 10 days. Call us with any medication changes or concerns # [phone].

## 2018-08-04 ENCOUNTER — Other Ambulatory Visit: Payer: Self-pay | Admitting: Physician Assistant

## 2018-08-07 ENCOUNTER — Other Ambulatory Visit: Payer: Self-pay | Admitting: Internal Medicine

## 2018-08-07 DIAGNOSIS — C672 Malignant neoplasm of lateral wall of bladder: Secondary | ICD-10-CM | POA: Diagnosis not present

## 2018-08-08 ENCOUNTER — Ambulatory Visit: Payer: Medicare Other | Admitting: *Deleted

## 2018-08-08 DIAGNOSIS — I4891 Unspecified atrial fibrillation: Secondary | ICD-10-CM

## 2018-08-08 DIAGNOSIS — Z7901 Long term (current) use of anticoagulants: Secondary | ICD-10-CM

## 2018-08-08 LAB — POCT INR: INR: 3.9 — AB (ref 2.0–3.0)

## 2018-08-08 NOTE — Patient Instructions (Signed)
Description   Skip today's dose,  then continue taking 1 tablet daily except 1.5 tablets on Mondays, Wednesdays, and Fridays.  Recheck INR in 10 days. Call us with any medication changes or concerns # [phone].

## 2018-08-09 ENCOUNTER — Other Ambulatory Visit: Payer: Self-pay | Admitting: *Deleted

## 2018-08-09 MED ORDER — FAMOTIDINE 40 MG PO TABS: 40.0000 mg | ORAL_TABLET | Freq: Every day | ORAL | 3 refills | Status: DC

## 2018-08-14 ENCOUNTER — Telehealth: Payer: Self-pay | Admitting: Oncology

## 2018-08-14 NOTE — Telephone Encounter (Signed)
Called pt per 12/9 sch message - left message for patient to call back If need to r/s

## 2018-08-15 NOTE — Telephone Encounter (Signed)
Cancelled appt per 12/10 sch message - called patient daughter a second time - no answer left message to call back to r/s

## 2018-08-16 ENCOUNTER — Inpatient Hospital Stay: Payer: Medicare Other | Admitting: Oncology

## 2018-08-18 ENCOUNTER — Ambulatory Visit: Payer: Medicare Other | Admitting: *Deleted

## 2018-08-18 DIAGNOSIS — Z7901 Long term (current) use of anticoagulants: Secondary | ICD-10-CM

## 2018-08-18 DIAGNOSIS — I4891 Unspecified atrial fibrillation: Secondary | ICD-10-CM

## 2018-08-18 LAB — POCT INR: INR: 3.7 — AB (ref 2.0–3.0)

## 2018-08-18 NOTE — Patient Instructions (Signed)
Description   Skip today's dose, then start  taking 1 tablet daily except 1.5 tablets on Mondays and Fridays.  Recheck INR in 2 weeks. Call us with any medication changes or concerns # [phone].

## 2018-08-25 DIAGNOSIS — C672 Malignant neoplasm of lateral wall of bladder: Secondary | ICD-10-CM | POA: Diagnosis not present

## 2018-08-25 DIAGNOSIS — Z5111 Encounter for antineoplastic chemotherapy: Secondary | ICD-10-CM | POA: Diagnosis not present

## 2018-08-31 ENCOUNTER — Ambulatory Visit (INDEPENDENT_AMBULATORY_CARE_PROVIDER_SITE_OTHER): Payer: Medicare Other

## 2018-08-31 DIAGNOSIS — I4891 Unspecified atrial fibrillation: Secondary | ICD-10-CM

## 2018-08-31 DIAGNOSIS — I495 Sick sinus syndrome: Secondary | ICD-10-CM | POA: Diagnosis not present

## 2018-09-01 ENCOUNTER — Ambulatory Visit: Payer: Medicare Other | Admitting: *Deleted

## 2018-09-01 DIAGNOSIS — I4891 Unspecified atrial fibrillation: Secondary | ICD-10-CM | POA: Diagnosis not present

## 2018-09-01 DIAGNOSIS — Z5111 Encounter for antineoplastic chemotherapy: Secondary | ICD-10-CM | POA: Diagnosis not present

## 2018-09-01 DIAGNOSIS — C672 Malignant neoplasm of lateral wall of bladder: Secondary | ICD-10-CM | POA: Diagnosis not present

## 2018-09-01 DIAGNOSIS — Z7901 Long term (current) use of anticoagulants: Secondary | ICD-10-CM

## 2018-09-01 LAB — POCT INR: INR: 3.1 — AB (ref 2.0–3.0)

## 2018-09-01 NOTE — Progress Notes (Signed)
Remote pacemaker transmission.   

## 2018-09-01 NOTE — Patient Instructions (Signed)
Description   Start taking 1 tablet daily except 1.5 tablets on Mondays. Recheck INR in 2 weeks. Call us with any medication changes or concerns # [phone].

## 2018-09-02 LAB — CUP PACEART REMOTE DEVICE CHECK
Battery Impedance: 1522 Ohm
Battery Remaining Longevity: 42 mo
Battery Voltage: 2.78 V
Brady Statistic AP VP Percent: 0 %
Brady Statistic AP VS Percent: 74 %
Brady Statistic AS VP Percent: 0 %
Brady Statistic AS VS Percent: 25 %
Date Time Interrogation Session: 20191226170917
Implantable Lead Implant Date: 20111027
Implantable Lead Location: 753859
Implantable Lead Location: 753860
Implantable Lead Model: 4469
Implantable Lead Model: 4470
Implantable Lead Serial Number: 548229
Implantable Lead Serial Number: 686026
Implantable Pulse Generator Implant Date: 20111027
Lead Channel Impedance Value: 479 Ohm
Lead Channel Pacing Threshold Amplitude: 0.375 V
Lead Channel Pacing Threshold Amplitude: 1.25 V
Lead Channel Pacing Threshold Pulse Width: 0.4 ms
Lead Channel Pacing Threshold Pulse Width: 0.4 ms
Lead Channel Setting Pacing Amplitude: 2.5 V
Lead Channel Setting Pacing Pulse Width: 0.4 ms
Lead Channel Setting Sensing Sensitivity: 2 mV
MDC IDC LEAD IMPLANT DT: 20111027
MDC IDC MSMT LEADCHNL RV IMPEDANCE VALUE: 389 Ohm
MDC IDC SET LEADCHNL RA PACING AMPLITUDE: 2 V

## 2018-09-08 DIAGNOSIS — Z5111 Encounter for antineoplastic chemotherapy: Secondary | ICD-10-CM | POA: Diagnosis not present

## 2018-09-08 DIAGNOSIS — C672 Malignant neoplasm of lateral wall of bladder: Secondary | ICD-10-CM | POA: Diagnosis not present

## 2018-09-15 ENCOUNTER — Ambulatory Visit: Payer: Medicare Other | Admitting: *Deleted

## 2018-09-15 DIAGNOSIS — C672 Malignant neoplasm of lateral wall of bladder: Secondary | ICD-10-CM | POA: Diagnosis not present

## 2018-09-15 DIAGNOSIS — Z7901 Long term (current) use of anticoagulants: Secondary | ICD-10-CM

## 2018-09-15 DIAGNOSIS — I4891 Unspecified atrial fibrillation: Secondary | ICD-10-CM | POA: Diagnosis not present

## 2018-09-15 DIAGNOSIS — Z5111 Encounter for antineoplastic chemotherapy: Secondary | ICD-10-CM | POA: Diagnosis not present

## 2018-09-15 LAB — POCT INR: INR: 2.9 (ref 2.0–3.0)

## 2018-09-15 NOTE — Patient Instructions (Signed)
Description   Continue taking 1 tablet daily except 1.5 tablets on Mondays. Recheck INR in 3 weeks. Call us with any medication changes or concerns # [phone].

## 2018-09-22 DIAGNOSIS — Z5111 Encounter for antineoplastic chemotherapy: Secondary | ICD-10-CM | POA: Diagnosis not present

## 2018-09-22 DIAGNOSIS — C672 Malignant neoplasm of lateral wall of bladder: Secondary | ICD-10-CM | POA: Diagnosis not present

## 2018-10-03 DIAGNOSIS — C672 Malignant neoplasm of lateral wall of bladder: Secondary | ICD-10-CM | POA: Diagnosis not present

## 2018-10-03 DIAGNOSIS — Z5111 Encounter for antineoplastic chemotherapy: Secondary | ICD-10-CM | POA: Diagnosis not present

## 2018-10-05 ENCOUNTER — Ambulatory Visit: Payer: Medicare Other | Admitting: *Deleted

## 2018-10-05 DIAGNOSIS — I4891 Unspecified atrial fibrillation: Secondary | ICD-10-CM

## 2018-10-05 DIAGNOSIS — Z7901 Long term (current) use of anticoagulants: Secondary | ICD-10-CM

## 2018-10-05 LAB — POCT INR: INR: 3.2 — AB (ref 2.0–3.0)

## 2018-10-05 NOTE — Patient Instructions (Signed)
Description   Today take 1/2 tablet then continue taking 1 tablet daily except 1.5 tablets on Mondays. Recheck INR in 3 weeks. Call us with any medication changes or concerns # [phone].

## 2018-10-10 DIAGNOSIS — C672 Malignant neoplasm of lateral wall of bladder: Secondary | ICD-10-CM | POA: Diagnosis not present

## 2018-10-10 DIAGNOSIS — N3000 Acute cystitis without hematuria: Secondary | ICD-10-CM | POA: Diagnosis not present

## 2018-10-13 ENCOUNTER — Emergency Department (HOSPITAL_COMMUNITY)
Admission: EM | Admit: 2018-10-13 | Discharge: 2018-10-13 | Disposition: A | Discharge status: Home / Self Care | Payer: Medicare Other | Attending: Emergency Medicine | Admitting: Emergency Medicine

## 2018-10-13 ENCOUNTER — Encounter (HOSPITAL_COMMUNITY): Payer: Self-pay | Admitting: Emergency Medicine

## 2018-10-13 ENCOUNTER — Emergency Department (HOSPITAL_COMMUNITY): Payer: Medicare Other

## 2018-10-13 ENCOUNTER — Other Ambulatory Visit: Payer: Self-pay | Admitting: Internal Medicine

## 2018-10-13 DIAGNOSIS — I129 Hypertensive chronic kidney disease with stage 1 through stage 4 chronic kidney disease, or unspecified chronic kidney disease: Secondary | ICD-10-CM | POA: Diagnosis not present

## 2018-10-13 DIAGNOSIS — E860 Dehydration: Secondary | ICD-10-CM

## 2018-10-13 DIAGNOSIS — Z950 Presence of cardiac pacemaker: Secondary | ICD-10-CM | POA: Diagnosis not present

## 2018-10-13 DIAGNOSIS — R05 Cough: Secondary | ICD-10-CM | POA: Diagnosis not present

## 2018-10-13 DIAGNOSIS — N390 Urinary tract infection, site not specified: Secondary | ICD-10-CM | POA: Diagnosis not present

## 2018-10-13 DIAGNOSIS — Z7901 Long term (current) use of anticoagulants: Secondary | ICD-10-CM | POA: Diagnosis not present

## 2018-10-13 DIAGNOSIS — Z79899 Other long term (current) drug therapy: Secondary | ICD-10-CM | POA: Insufficient documentation

## 2018-10-13 DIAGNOSIS — N183 Chronic kidney disease, stage 3 (moderate): Secondary | ICD-10-CM | POA: Insufficient documentation

## 2018-10-13 DIAGNOSIS — I10 Essential (primary) hypertension: Secondary | ICD-10-CM | POA: Diagnosis not present

## 2018-10-13 DIAGNOSIS — R5383 Other fatigue: Secondary | ICD-10-CM | POA: Diagnosis not present

## 2018-10-13 DIAGNOSIS — M791 Myalgia, unspecified site: Secondary | ICD-10-CM | POA: Insufficient documentation

## 2018-10-13 DIAGNOSIS — I2510 Atherosclerotic heart disease of native coronary artery without angina pectoris: Secondary | ICD-10-CM | POA: Insufficient documentation

## 2018-10-13 DIAGNOSIS — M6281 Muscle weakness (generalized): Secondary | ICD-10-CM | POA: Diagnosis not present

## 2018-10-13 DIAGNOSIS — E039 Hypothyroidism, unspecified: Secondary | ICD-10-CM | POA: Insufficient documentation

## 2018-10-13 DIAGNOSIS — R52 Pain, unspecified: Secondary | ICD-10-CM

## 2018-10-13 LAB — CBC
HCT: 48.3 % — ABNORMAL HIGH (ref 36.0–46.0)
Hemoglobin: 16.1 g/dL — ABNORMAL HIGH (ref 12.0–15.0)
MCH: 33 pg (ref 26.0–34.0)
MCHC: 33.3 g/dL (ref 30.0–36.0)
MCV: 99 fL (ref 80.0–100.0)
Platelets: 232 10*3/uL (ref 150–400)
RBC: 4.88 MIL/uL (ref 3.87–5.11)
RDW: 13.7 % (ref 11.5–15.5)
WBC: 7.1 10*3/uL (ref 4.0–10.5)
nRBC: 0 % (ref 0.0–0.2)

## 2018-10-13 LAB — DIFFERENTIAL
Abs Immature Granulocytes: 0.04 10*3/uL (ref 0.00–0.07)
Basophils Absolute: 0 10*3/uL (ref 0.0–0.1)
Basophils Relative: 0 %
Eosinophils Absolute: 0 10*3/uL (ref 0.0–0.5)
Eosinophils Relative: 1 %
Immature Granulocytes: 1 %
LYMPHS ABS: 1 10*3/uL (ref 0.7–4.0)
LYMPHS PCT: 14 %
Monocytes Absolute: 0.5 10*3/uL (ref 0.1–1.0)
Monocytes Relative: 7 %
Neutro Abs: 5.5 10*3/uL (ref 1.7–7.7)
Neutrophils Relative %: 77 %

## 2018-10-13 LAB — CBG MONITORING, ED: Glucose-Capillary: 112 mg/dL — ABNORMAL HIGH (ref 70–99)

## 2018-10-13 LAB — BASIC METABOLIC PANEL
Anion gap: 10 (ref 5–15)
BUN: 12 mg/dL (ref 8–23)
CO2: 25 mmol/L (ref 22–32)
Calcium: 8.8 mg/dL — ABNORMAL LOW (ref 8.9–10.3)
Chloride: 98 mmol/L (ref 98–111)
Creatinine, Ser: 0.78 mg/dL (ref 0.44–1.00)
GFR calc Af Amer: >60 mL/min (ref >60)
GFR calc non Af Amer: >60 mL/min (ref >60)
GLUCOSE: 124 mg/dL — AB (ref 70–99)
Potassium: 3.9 mmol/L (ref 3.5–5.1)
Sodium: 133 mmol/L — ABNORMAL LOW (ref 135–145)

## 2018-10-13 LAB — PROTIME-INR
INR: 1.76
Prothrombin Time: 20.3 seconds — ABNORMAL HIGH (ref 11.4–15.2)

## 2018-10-13 LAB — LACTIC ACID, PLASMA: Lactic Acid, Venous: 1.8 mmol/L (ref 0.5–1.9)

## 2018-10-13 MED ORDER — SODIUM CHLORIDE 0.9% FLUSH: 3.0000 mL | Freq: Once | INTRAVENOUS | Status: DC

## 2018-10-13 MED ORDER — SODIUM CHLORIDE 0.9 % IV BOLUS
500.0000 mL | Freq: Once | INTRAVENOUS | Status: AC
  Administered 2018-10-13: 500 mL via INTRAVENOUS

## 2018-10-13 NOTE — ED Provider Notes (Signed)
[facility] DEPT Provider Note   CSN: [id number] Arrival date & time: 10/13/18  1629     History   Chief Complaint Chief Complaint  Patient presents with  . Weakness    HPI [name] is a 85 y.o. female with past medical history of adenocarcinoma of the stomach, CKD status post left nephrectomy, stroke, Axis small atrial fibrillation, bladder cancer, presenting to the emergency department with complaint of gradually worsening generalized weakness and fatigue x1 month.  Patient recently diagnosed with bladder cancer in August 2019.  Began getting BCG tuberculin treatment in December, last treatment on January 28.  Patient states she has generalized weakness, fatigue, muscle aches.  Intermittent nausea and vomiting last week.  Some nausea this morning.  Treated symptoms with Tylenol around 9 AM.  No associated fevers, abdominal pain, dysuria, hematuria, flank pain. Her daughter reports she had a dry cough yesterday, however resolved. Followed by Dr. Tresa Moore with urology, last appointment was Tuesday of this week.  States she was started on Keflex on Tuesday, taking 500 mg 3 times daily as prescribed.  Today she was called by urologist saying urine culture was positive, instructed to continue taking the antibiotic.  Noted to be hypertensive in triage, patient is compliant with blood pressure medications, due for next dose around 7 PM this evening.  The history is provided by the patient and a relative.    Past Medical History:  Diagnosis Date  . Adenocarcinoma of stomach ([place]) 11/11/2015   gastric mass on egd  . Anal fissure   . Anemia    hx 3/17 iron infusion, on aranesp and injected B12 since 11/2015.   [name] Anxiety   . Aortic root dilatation ([place])   . Arthritis    oa  . 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 CAD (coronary artery disease)   . Chronic kidney disease    only has one kidney rt lft rem 03 ca     dr. Risa Grill, bladder cancer 05-04-2018  . Elevated LFTs     hepatic steatosis, none recent  . Gastroparesis   . GERD (gastroesophageal reflux disease)   . History of uterine cancer 1980s   treated with hysterectomy, external radiation and radiation seed implants.   [name] HTN (hypertension)   . Hyperlipemia   . Hypothyroidism   . Iron deficiency anemia due to chronic blood loss 11/24/2015  . LVH (left ventricular hypertrophy) 11/14/2015   Mild, noted on ECHO  . Moderate aortic stenosis   . Neuropathy    right leg  . No blood products 02/27/2016  . Osteomyelitis ([place]) as child  . PAF (paroxysmal atrial fibrillation) ([place])   . PONV (postoperative nausea and vomiting)    long time ago  . Presence of permanent cardiac pacemaker   . Radiation proctitis   . Restless leg syndrome   . Stroke ([place]) 15   partial stroke left legally blind; left eye   . Tachycardia-bradycardia syndrome [facility])    s/p PPM by Dr Doreatha Lew (MDT) 07/03/10    Patient Active Problem List   Diagnosis Date Noted  . Malignant tumor of urinary bladder ([place]) 05/11/2018  . History of stroke 04/11/2018  . Long term current use of anticoagulant therapy 03/19/2016  . No blood products 02/27/2016  . S/P partial gastrectomy 02/17/2016  . Post gastrectomy syndrome   . Gastroesophageal reflux disease with esophagitis   . B12 deficiency 11/24/2015  . Iron deficiency anemia due to chronic blood loss 11/24/2015  . Refusal  of blood transfusions as patient is Jehovah's Witness   . Adenocarcinoma of stomach ([place]) 11/11/2015  . CKD (chronic kidney disease), stage III ([place]) 11/10/2015  . Vitamin B12 deficiency 11/09/2015  . Essential hypertension 06/04/2014  . Pre-operative cardiovascular examination 02/15/2014  . Prediabetes 09/24/2013  . Vitamin D deficiency 09/24/2013  . Hyperlipemia   . Cardiac pacemaker  08/26/2012  . FHx/o Colon Cancer 07/27/2011  . Aortic stenosis, moderate 07/09/2011  . S/P repair of patent ductus arteriosus 04/22/2011  . Atrial fibrillation ([place]) Chadsvasc 5  12/02/2010  . Malignant neoplasm of kidney excluding renal pelvis ([place]) 11/17/2007  . Hypothyroidism 11/17/2007    Past Surgical History:  Procedure Laterality Date  . CARDIAC CATHETERIZATION  2008  . CHOLECYSTECTOMY  1970s  . COLONOSCOPY N/A 11/11/2015   Procedure: COLONOSCOPY;  Surgeon: [name], MD;  Location: [facility];  Service: Endoscopy;  Laterality: N/A;  . CYSTOSCOPY W/ RETROGRADES Right 05/01/2018   Procedure: CYSTOSCOPY WITH RIGHT RETROGRADE PYELOGRAM;  Surgeon: Lucas Mallow, MD;  Location: [facility] ORS;  Service: Urology;  Laterality: Right;  . CYSTOSCOPY W/ RETROGRADES Right 07/21/2018   Procedure: CYSTOSCOPY WITH RIGHT RETROGRADE PYELOGRAM;  Surgeon: Alexis Frock, MD;  [facility];  Service: Urology;  Laterality: Right;  . ESOPHAGOGASTRODUODENOSCOPY N/A 11/11/2015   Procedure: ESOPHAGOGASTRODUODENOSCOPY (EGD);  Surgeon: [name], MD;  Location[facility];  Service: Endoscopy;  Laterality: N/A;  . ESOPHAGOGASTRODUODENOSCOPY N/A 02/05/2016   Procedure: ESOPHAGOGASTRODUODENOSCOPY (EGD);  Surgeon: Ladene Artist, MD;  [facility];  Service: Endoscopy;  Laterality: N/A;  . ESOPHAGOGASTRODUODENOSCOPY N/A 04/02/2016   Procedure: ESOPHAGOGASTRODUODENOSCOPY (EGD);  Surgeon: [name], MD;  Location: [facility];  Service: Endoscopy;  Laterality: N/A;  . ESOPHAGOGASTRODUODENOSCOPY (EGD) WITH PROPOFOL N/A 04/06/2017   Procedure: ESOPHAGOGASTRODUODENOSCOPY (EGD) WITH PROPOFOL;  Surgeon: [name], MD;  [facility];  Service: Endoscopy;  Laterality: N/A;  . GASTRECTOMY N/A 01/15/2016   Procedure: OPEN PARTIAL GASTRECTOMY;  Surgeon: Stark Klein, MD;  Location: [facility];  Service: General;  Laterality: N/A;  . GASTRECTOMY Left 02/24/2016   Procedure: OPEN J TUBE;  Surgeon: Stark Klein, MD;  Location: [facility];  Service: General;  Laterality: Left;  [name] GASTROJEJUNOSTOMY N/A 05/18/2016   Procedure: ROUX EN Y GASTROJEJUNOSTOMY;  Surgeon: Stark Klein, MD;  Location: [facility];  Service: General;  Laterality: N/A;  . I&D KNEE WITH POLY EXCHANGE Right 12/17/2013   Procedure: IRRIGATION AND DEBRIDEMEN RIGHT TOTAL  KNEE WITH POLY EXCHANGE;  Surgeon: Mauri Pole, MD;  [facility];  Service: Orthopedics;  Laterality: Right;  . IR GENERIC HISTORICAL  06/17/2016   IR RADIOLOGIST EVAL & MGMT 06/17/2016 Ascencion Dike, PA-C [facility] INTERV RAD  . LAPAROSCOPY N/A 01/15/2016   Procedure: LAPAROSCOPY DIAGNOSTIC;  Surgeon: Stark Klein, MD;  Location: [facility];  Service: General;  Laterality: N/A;  . LYSIS OF ADHESION  05/18/2016   Procedure: LYSIS OF ADHESION;  Surgeon: Stark Klein, MD;  Location: [facility];  Service: General;;  . NEPHRECTOMY Left    renal cell cancer  . PACEMAKER INSERTION  07/04/2011   MDT PPM implanted by Dr Doreatha Lew for tachy/brady; managed by Dr Thompson Grayer   . PATELLAR TENDON REPAIR Right 03/06/2014   Procedure: AVULSION WITH PATELLA TENDON REPAIR;  Surgeon: Mauri Pole, MD;  [facility];  Service: Orthopedics;  Laterality: Right;  . PATENT DUCTUS ARTERIOUS REPAIR  ~ 1949  . TOTAL ABDOMINAL HYSTERECTOMY  85  . TOTAL HIP REVISION Right 2009  initial replacment surg  . TOTAL KNEE ARTHROPLASTY  02/07/2012   Procedure: TOTAL KNEE ARTHROPLASTY;  Surgeon: Mauri Pole, MD;  [facility];  Service: Orthopedics;  Laterality: Right;  . TOTAL KNEE REVISION Right 07/29/2014   Procedure: RIGHT KNEE REVISION OF PREVIOUS REPAIR EXTENSOR MECHANISM;  Surgeon: Mauri Pole, MD;  [facility];  Service: Orthopedics;  Laterality: Right;  . TRANSURETHRAL RESECTION OF BLADDER TUMOR N/A 07/21/2018   Procedure: TRANSURETHRAL RESECTION OF BLADDER TUMOR (TURBT);  Surgeon: Alexis Frock, MD;  [facility];  Service: Urology;  Laterality: N/A;  . TRANSURETHRAL RESECTION OF BLADDER TUMOR WITH MITOMYCIN-C N/A 05/01/2018   Procedure: TRANSURETHRAL RESECTION OF BLADDER TUMOR WITH POST OP INSTILLATION OF GEMCITABINE;  Surgeon: Lucas Mallow, MD;  [facility];  Service: Urology;  Laterality: N/A;  . WHIPPLE PROCEDURE N/A 05/18/2016   Procedure: EXPLORATORY LAPAROTOMY;  Surgeon: Stark Klein, MD;  Location: [facility];  Service: General;  Laterality: N/A;     OB History   No obstetric history on file.      Home Medications    Prior to Admission medications   Medication Sig Start Date End Date Taking? Authorizing Provider  acetaminophen (TYLENOL) 500 MG tablet Take 500-1,000 mg by mouth every 6 (six) hours as needed for mild pain or headache.    Yes [provider]  ALPRAZolam (XANAX) 0.5 MG tablet TAKE 1/2-1 TABLET BY MOUTH 3 TIMES A DAY AS NEEDED FOR ANXIETY Patient taking differently: Take 0.25 mg by mouth 3 (three) times daily as needed for anxiety or sleep.  01/01/16  Yes Rolene Course, PA-C  Biotin 10 MG CAPS Take 10 mg by mouth daily.    Yes [provider]  cephALEXin (KEFLEX) 500 MG capsule Take 500 mg by mouth 3 (three) times daily. 10/10/18  Yes [provider]  cetirizine (ZYRTEC) 10 MG tablet Take 10 mg by mouth daily.   Yes [provider]  Cholecalciferol (VITAMIN D-3) 5000 units TABS Take 5,000 Units by mouth daily.   Yes [provider]  diltiazem (CARDIZEM CD) 180 MG 24 hr capsule TAKE 1 CAPSULE BY MOUTH EVERY DAY Patient taking differently: Take 180 mg by mouth daily.  05/14/18  Yes [name], NP  gabapentin (NEURONTIN) 300 MG capsule Take 300 mg by mouth 2 (two) times daily. Takes am and lunch   Yes [provider]  levothyroxine (SYNTHROID, LEVOTHROID) 100 MCG tablet TAKE 1/2 TABLET BY MOUTH DAILY BEFORE BREAKFAST. Patient taking differently: Take 50 mcg by mouth daily before breakfast.  05/29/18  Yes [name], MD  loperamide (IMODIUM) 2 MG capsule Take by mouth as needed for diarrhea or loose stools. Up to 16 day   Yes [provider]  losartan (COZAAR) 25 MG tablet Take 25 mg by mouth daily.   Yes [provider]   Magnesium 500 MG TABS Take 500 mg by mouth daily.    Yes [provider]  metoprolol tartrate (LOPRESSOR) 100 MG tablet Take 1 tablet (100 mg total) by mouth 2 (two) times daily. 03/01/18  Yes [name], MD  nitroGLYCERIN (NITROSTAT) 0.4 MG SL tablet Place 1 tablet (0.4 mg total) under the tongue every 5 (five) minutes as needed for chest pain (MAX 3 TABLETS). 02/17/17  Yes [name], MD  ondansetron (ZOFRAN-ODT) 8 MG disintegrating tablet Take 8 mg by mouth every 8 (eight) hours as needed for nausea or vomiting.    Yes [provider]  ranitidine (ZANTAC) 150  MG tablet Take 150 mg by mouth 2 (two) times daily.   Yes [provider]  simethicone (MYLICON) 80 MG chewable tablet Chew 80 mg by mouth every 6 (six) hours as needed for flatulence.   Yes [provider]  sucralfate (CARAFATE) 1 g tablet TAKE 1 TABLET (1 G TOTAL) BY MOUTH 4 (FOUR) TIMES DAILY - WITH MEALS AND AT BEDTIME. Patient taking differently: Take 1 g by mouth 3 (three) times daily.  08/04/18  Yes [name], MD  traMADol (ULTRAM) 50 MG tablet Take 1 tablet (50 mg total) by mouth every 6 (six) hours as needed. Patient taking differently: Take 25-50 mg by mouth every 6 (six) hours as needed.  05/01/18 05/01/19 Yes Marton Redwood III, MD  warfarin (COUMADIN) 2 MG tablet TAKE 1 TABLET DAILY OR AS DIRECTED Patient taking differently: Take 2-3 mg by mouth See admin instructions. Take 3 mg by mouth on Mondays and 2 mg all other days 05/22/18  Yes Vicie Mutters, PA-C  famotidine (PEPCID) 40 MG tablet Take 1 tablet (40 mg total) by mouth daily. Patient not taking: Reported on 10/13/2018 08/09/18   [name], MD  furosemide (LASIX) 20 MG tablet TAKE 1 TABLET BY MOUTH EVERY DAY Patient not taking: Reported on 10/13/2018 08/07/18   [name], MD  losartan (COZAAR) 50 MG tablet Take 1 tablet (50 mg total) by mouth daily. Patient not taking: Reported on 10/13/2018 07/13/18 07/08/19   [name], [name], MD    Family History Family History  Problem Relation Age of Onset  . Colon cancer Mother 53  . Heart disease Mother   . Heart disease Father   . Heart attack Father   . Heart disease Maternal Grandmother     Social History Social History   Tobacco Use  . Smoking status: Never Smoker  . Smokeless tobacco: Never Used  Substance Use Topics  . Alcohol use: No  . Drug use: No     Allergies   Adhesive [tape]; Amiodarone; Hydrocodone; Remeron [mirtazapine]; Codeine; Morphine and related; Reglan [metoclopramide]; Requip [ropinirole hcl]; Zinc; and Other   Review of Systems Review of Systems  Constitutional: Positive for fatigue.       Generalized weakness  Respiratory: Positive for cough.   Gastrointestinal: Positive for nausea and vomiting.  Genitourinary: Negative for flank pain.  All other systems reviewed and are negative.    Physical Exam Updated Vital Signs BP (!) 178/85   Pulse 63   Temp 98.4 F (36.9 C) (Oral)   Resp 15   SpO2 95%   Physical Exam Vitals signs and nursing note reviewed.  Constitutional:      Appearance: She is well-developed.     Comments: Chronically ill-appearing.  Thin.  HENT:     Head: Normocephalic and atraumatic.     Mouth/Throat:     Mouth: Mucous membranes are moist.  Eyes:     Conjunctiva/sclera: Conjunctivae normal.  Cardiovascular:     Rate and Rhythm: Normal rate and regular rhythm.     Heart sounds: Murmur present.  Pulmonary:     Effort: Pulmonary effort is normal. No respiratory distress.     Breath sounds: Normal breath sounds.  Abdominal:     General: A surgical scar is present. Bowel sounds are normal.     Palpations: Abdomen is soft.     Tenderness: There is abdominal tenderness (Generalized). There is no guarding or rebound.  Musculoskeletal:     Right lower leg: No edema.  Left lower leg: No edema.  Skin:    General: Skin is warm.  Neurological:     Mental Status: She is alert.      Comments: Answering questions appropriately.  Psychiatric:        Behavior: Behavior normal.      ED Treatments / Results  Labs (all labs ordered are listed, but only abnormal results are displayed) Labs Reviewed  BASIC METABOLIC PANEL - Abnormal; Notable for the following components:      Result Value   Sodium 133 (*)    Glucose, Bld 124 (*)    Calcium 8.8 (*)    All other components within normal limits  CBC - Abnormal; Notable for the following components:   Hemoglobin 16.1 (*)    HCT 48.3 (*)    All other components within normal limits  PROTIME-INR - Abnormal; Notable for the following components:   Prothrombin Time 20.3 (*)    All other components within normal limits  CBG MONITORING, ED - Abnormal; Notable for the following components:   Glucose-Capillary 112 (*)    All other components within normal limits  DIFFERENTIAL  LACTIC ACID, PLASMA  URINALYSIS, ROUTINE W REFLEX MICROSCOPIC    EKG EKG Interpretation  Date/Time:  Friday October 13 2018 16:52:06 EST Ventricular Rate:  60 PR Interval:    QRS Duration: 98 QT Interval:  520 QTC Calculation: 520 R Axis:   5 Text Interpretation:  Sinus rhythm Repol abnrm, prob ischemia, anterolateral lds Prolonged QT interval Since last tracing rate faster and QT is long now Confirmed by Daleen Bo [phone]) on 10/13/2018 5:54:10 PM   Radiology Dg Chest 2 View  Result Date: 10/13/2018 CLINICAL DATA:  85 y/o  F; generalized weakness and cough. EXAM: CHEST - 2 VIEW COMPARISON:  10/20/2016 chest radiograph FINDINGS: Stable normal cardiac silhouette given projection and technique. Aortic atherosclerosis with calcification. Two lead pacemaker stable in position. Small left effusion and left basilar opacity. No pneumothorax. No acute osseous abnormality is evident. IMPRESSION: Small left effusion and left basilar opacity which may represent associated atelectasis or pneumonia. Electronically Signed   By: Kristine Garbe  M.D.   On: 10/13/2018 18:41    Procedures Procedures (including critical care time)  Medications Ordered in ED Medications  sodium chloride flush (NS) 0.9 % injection 3 mL (has no administration in time range)  sodium chloride 0.9 % bolus 500 mL (0 mLs Intravenous Stopped 10/13/18 2015)     Initial Impression / Assessment and Plan / ED Course  I have reviewed the triage vital signs and the nursing notes.  Pertinent labs & imaging results that were available during my care of the patient were reviewed by me and considered in my medical decision making (see chart for details).    Patient currently undergoing treatment for bladder cancer, BCG tuberculin, presenting to the emergency department with gradually worsening generalized fatigue, weakness, muscle aches.  Also currently being treated for UTI by urology, taking Keflex.  Symptoms have gradual onset, have been ongoing for at least 1 month.  On exam, patient afebrile, hypertensive though due for blood pressure medication.  Abdomen is soft generalized tenderness.  No guarding or rebound.  Lungs are clear.  Patient is alert and answering questions appropriately.  Labs are reassuring; no leukocytosis, no significant electrolyte derangements.  Patient does appear to be mildly dehydrated.  Lactic acid within normal limits.  Treated in the ED with the fluids.  EKG with new prolonged QT, however not currently in A.  fib.  Chest x-ray with small effusion, questionable pneumonia.  Given patient's brief cough yesterday and absence of upper respiratory symptoms today, had shared decision making with patient and her family regarding possibility of early pneumonia vs nonspecific finding on CXR.  Will hold on additional antibiotics at this time  however discussed return precautions and close PCP follow-up. Sx do not appear to be consistent with hypersensitivity reaction from BCG tretament.  Patient treated for dehydration, and is currently safe for discharge at  this time.  Patient and patient's family agreeable to plan.  Return precautions discussed.  Continue Keflex as prescribed.  Patient discussed with and evaluated by Dr.Wentz.  Discussed results, findings, treatment and follow up. Patient advised of return precautions. Patient verbalized understanding and agreed with plan.   Final Clinical Impressions(s) / ED Diagnoses   Final diagnoses:  Fatigue, unspecified type  Generalized body aches  Dehydration  Hypertension, unspecified type    ED Discharge Orders    None       Robinson, [name] N, PA-C 10/13/18 2214    Daleen Bo, MD 10/14/18 1422

## 2018-10-13 NOTE — ED Triage Notes (Signed)
Pt BIB [facility] ambulance service. Pt from home c/o generalized weakness. Pt started an antibiotic on Tuesday for a UTI and has since become more weak and is unable to walk. CAOx4.

## 2018-10-13 NOTE — Discharge Instructions (Addendum)
It is important you stay hydrated. Finish the antibiotics as prescribed. Take your blood pressure medication when you return home. Discussed your INR level of 1.76 with the Coumadin clinic. Follow-up closely with your primary care provider.

## 2018-10-13 NOTE — ED Provider Notes (Signed)
  Face-to-face evaluation   History: Patient here for evaluation of general weakness, starting several weeks ago, associated with treatment for bladder cancer.  Also diagnosed with UTI, this week and started on Keflex.  Decreased appetite for several days.  No vomiting or fever.  Physical exam: Alert elderly female who is calm cooperative.  Heart regular rate and rhythm with soft murmur.  Lungs clear anteriorly.  Abdomen soft nontender.  Medical screening examination/treatment/procedure(s) were conducted as a shared visit with non-physician practitioner(s) and myself.  I personally evaluated the patient during the encounter    Daleen Bo, MD 10/14/18 1422

## 2018-10-13 NOTE — ED Notes (Signed)
Bed: WA03 Expected date:  Expected time:  Means of arrival:  Comments: EMS-weakness 

## 2018-10-16 ENCOUNTER — Ambulatory Visit (INDEPENDENT_AMBULATORY_CARE_PROVIDER_SITE_OTHER): Payer: Medicare Other | Admitting: Internal Medicine

## 2018-10-16 ENCOUNTER — Encounter: Payer: Self-pay | Admitting: Internal Medicine

## 2018-10-16 VITALS — BP 158/76 | HR 68 | Temp 97.3°F | Ht 59.0 in | Wt 97.6 lb

## 2018-10-16 DIAGNOSIS — M791 Myalgia, unspecified site: Secondary | ICD-10-CM

## 2018-10-16 DIAGNOSIS — R197 Diarrhea, unspecified: Secondary | ICD-10-CM

## 2018-10-16 DIAGNOSIS — G8929 Other chronic pain: Secondary | ICD-10-CM

## 2018-10-16 DIAGNOSIS — N390 Urinary tract infection, site not specified: Secondary | ICD-10-CM

## 2018-10-16 DIAGNOSIS — M2351 Chronic instability of knee, right knee: Secondary | ICD-10-CM

## 2018-10-16 DIAGNOSIS — Z79899 Other long term (current) drug therapy: Secondary | ICD-10-CM | POA: Diagnosis not present

## 2018-10-16 DIAGNOSIS — R0989 Other specified symptoms and signs involving the circulatory and respiratory systems: Secondary | ICD-10-CM | POA: Diagnosis not present

## 2018-10-16 DIAGNOSIS — M545 Low back pain, unspecified: Secondary | ICD-10-CM

## 2018-10-16 DIAGNOSIS — Z7901 Long term (current) use of anticoagulants: Secondary | ICD-10-CM

## 2018-10-16 DIAGNOSIS — R202 Paresthesia of skin: Secondary | ICD-10-CM

## 2018-10-16 DIAGNOSIS — R51 Headache: Secondary | ICD-10-CM

## 2018-10-16 DIAGNOSIS — M7071 Other bursitis of hip, right hip: Secondary | ICD-10-CM | POA: Diagnosis not present

## 2018-10-16 MED ORDER — DEXAMETHASONE 0.5 MG PO TABS: ORAL_TABLET | ORAL | 0 refills | Status: DC

## 2018-10-16 MED ORDER — BUTALBITAL-APAP-CAFFEINE 50-325-40 MG PO TABS: ORAL_TABLET | ORAL | 0 refills | Status: DC

## 2018-10-16 NOTE — Progress Notes (Signed)
Subjective:    Patient ID: [name], female    DOB: [birth year], 85 y.o.   MRN: [id number]  HPI     This nice 85 yo WWF has hx/o Uterine Ca (1994), Kidney Ca (2003), Gastric Ca (2017 ) and most recently a Bladder Ca (2019) & has completed #6 BCG Bladder Irrigations. Today, she presents with multiple c/o's including HA, myalgias, LBP, incontinence, hip & knee pains. .    Outpatient Medications Prior to Visit  Medication Sig Dispense Refill  . acetaminophen (TYLENOL) 500 MG tablet Take 500-1,000 mg by mouth every 6 (six) hours as needed for mild pain or headache.     . ALPRAZolam (XANAX) 0.5 MG tablet TAKE 1/2-1 TABLET BY MOUTH 3 TIMES A DAY AS NEEDED FOR ANXIETY (Patient taking differently: Take 0.25 mg by mouth 3 (three) times daily as needed for anxiety or sleep. ) 90 tablet 0  . Biotin 10 MG CAPS Take 10 mg by mouth daily.     . cephALEXin (KEFLEX) 500 MG capsule Take 500 mg by mouth 3 (three) times daily.    . cetirizine (ZYRTEC) 10 MG tablet Take 10 mg by mouth daily.    . Cholecalciferol (VITAMIN D-3) 5000 units TABS Take 5,000 Units by mouth daily.    [name] diltiazem (CARDIZEM CD) 180 MG 24 hr capsule TAKE 1 CAPSULE BY MOUTH EVERY DAY (Patient taking differently: Take 180 mg by mouth daily. ) 90 capsule 1  . famotidine (PEPCID) 40 MG tablet Take 1 tablet (40 mg total) by mouth daily. (Patient not taking: Reported on 10/13/2018) 90 tablet 3  . furosemide (LASIX) 20 MG tablet TAKE 1 TABLET BY MOUTH EVERY DAY (Patient not taking: Reported on 10/13/2018) 90 tablet 1  . gabapentin (NEURONTIN) 300 MG capsule Take 300 mg by mouth 2 (two) times daily. Takes am and lunch    . levothyroxine (SYNTHROID, LEVOTHROID) 100 MCG tablet TAKE 1/2 TABLET BY MOUTH DAILY BEFORE BREAKFAST. (Patient taking differently: Take 50 mcg by mouth daily before breakfast. ) 45 tablet 1  . loperamide (IMODIUM) 2 MG capsule Take by mouth as needed for diarrhea or loose stools. Up to 16 day    . losartan (COZAAR) 25 MG tablet  Take 25 mg by mouth daily.    . Magnesium 500 MG TABS Take 500 mg by mouth daily.     . metoprolol tartrate (LOPRESSOR) 100 MG tablet Take 1 tablet (100 mg total) by mouth 2 (two) times daily. 180 tablet 1  . nitroGLYCERIN (NITROSTAT) 0.4 MG SL tablet Place 1 tablet (0.4 mg total) under the tongue every 5 (five) minutes as needed for chest pain (MAX 3 TABLETS). 25 tablet prn  . ondansetron (ZOFRAN-ODT) 8 MG disintegrating tablet Take 8 mg by mouth every 8 (eight) hours as needed for nausea or vomiting.     . ranitidine (ZANTAC) 150 MG tablet Take 150 mg by mouth 2 (two) times daily.    . simethicone (MYLICON) 80 MG chewable tablet Chew 80 mg by mouth every 6 (six) hours as needed for flatulence.    . sucralfate (CARAFATE) 1 g tablet TAKE 1 TABLET (1 G TOTAL) BY MOUTH 4 (FOUR) TIMES DAILY - WITH MEALS AND AT BEDTIME. (Patient taking differently: Take 1 g by mouth 3 (three) times daily. ) 360 tablet 3  . traMADol (ULTRAM) 50 MG tablet Take 1 tablet (50 mg total) by mouth every 6 (six) hours as needed. (Patient taking differently: Take 25-50 mg by mouth every 6 (six) hours  as needed. ) 10 tablet 0  . warfarin (COUMADIN) 2 MG tablet TAKE 1 TABLET DAILY OR AS DIRECTED (Patient taking differently: Take 2-3 mg by mouth See admin instructions. Take 3 mg by mouth on Mondays and 2 mg all other days) 90 tablet 1  . losartan (COZAAR) 50 MG tablet Take 1 tablet (50 mg total) by mouth daily. (Patient not taking: Reported on 10/13/2018) 90 tablet 3   No facility-administered medications prior to visit.    Allergies  Allergen Reactions  . Adhesive [Tape] Other (See Comments)    Tears skin, Please use "paper" tape  . Amiodarone Other (See Comments)     Thyroid and liver and kidney problems  . Hydrocodone Other (See Comments)    HALLLUCINATIONS  . Remeron [Mirtazapine] Other (See Comments)    Hallucinations and make pt loopy  . Codeine Nausea And Vomiting  . Morphine And Related Nausea And Vomiting  . Reglan  [Metoclopramide] Hives, Itching and Rash  . Requip [Ropinirole Hcl] Other (See Comments)    Headache   . Zinc Nausea Only  . Other     NO BLOOD PRODUCTS   Past Medical History:  Diagnosis Date  . Adenocarcinoma of stomach ([place]) 11/11/2015   gastric mass on egd  . Anal fissure   . Anemia    hx 3/17 iron infusion, on aranesp and injected B12 since 11/2015.   [name] Anxiety   . Aortic root dilatation ([place])   . Arthritis    oa  . 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 CAD (coronary artery disease)   . Chronic kidney disease    only has one kidney rt lft rem 03 ca     dr. Risa Grill, bladder cancer 05-04-2018  . Elevated LFTs    hepatic steatosis, none recent  . Gastroparesis   . GERD (gastroesophageal reflux disease)   . History of uterine cancer 1980s   treated with hysterectomy, external radiation and radiation seed implants.   [name] HTN (hypertension)   . Hyperlipemia   . Hypothyroidism   . Iron deficiency anemia due to chronic blood loss 11/24/2015  . LVH (left ventricular hypertrophy) 11/14/2015   Mild, noted on ECHO  . Moderate aortic stenosis   . Neuropathy    right leg  . No blood products 02/27/2016  . Osteomyelitis ([place]) as child  . PAF (paroxysmal atrial fibrillation) ([place])   . PONV (postoperative nausea and vomiting)    long time ago  . Presence of permanent cardiac pacemaker   . Radiation proctitis   . Restless leg syndrome   . Stroke ([place]) 15   partial stroke left legally blind; left eye   . Tachycardia-bradycardia syndrome ([place])    s/p PPM by Dr Doreatha Lew (MDT) 07/03/10   Past Surgical History:  Procedure Laterality Date  . CARDIAC CATHETERIZATION  2008  . CHOLECYSTECTOMY  1970s  . COLONOSCOPY N/A 11/11/2015   Procedure: COLONOSCOPY;  Surgeon: [name], MD;  Location: [facility];  Service: Endoscopy;  Laterality: N/A;  . CYSTOSCOPY W/ RETROGRADES Right 05/01/2018   Procedure: CYSTOSCOPY WITH RIGHT RETROGRADE PYELOGRAM;  Surgeon: Lucas Mallow, MD;  Location: [facility];   Service: Urology;  Laterality: Right;  . CYSTOSCOPY W/ RETROGRADES Right 07/21/2018   Procedure: CYSTOSCOPY WITH RIGHT RETROGRADE PYELOGRAM;  Surgeon: Alexis Frock, MD;  Location: [facility];  Service: Urology;  Laterality: Right;  . ESOPHAGOGASTRODUODENOSCOPY N/A 11/11/2015   Procedure: ESOPHAGOGASTRODUODENOSCOPY (EGD);  Surgeon: [name], MD;  Location: [facility];  Service:  Endoscopy;  Laterality: N/A;  . ESOPHAGOGASTRODUODENOSCOPY N/A 02/05/2016   Procedure: ESOPHAGOGASTRODUODENOSCOPY (EGD);  Surgeon: Ladene Artist, MD;  [facility];  Service: Endoscopy;  Laterality: N/A;  . ESOPHAGOGASTRODUODENOSCOPY N/A 04/02/2016   Procedure: ESOPHAGOGASTRODUODENOSCOPY (EGD);  Surgeon: [name], MD;  Location: [facility];  Service: Endoscopy;  Laterality: N/A;  . ESOPHAGOGASTRODUODENOSCOPY (EGD) WITH PROPOFOL N/A 04/06/2017   Procedure: ESOPHAGOGASTRODUODENOSCOPY (EGD) WITH PROPOFOL;  Surgeon: [name], MD;  [facility];  Service: Endoscopy;  Laterality: N/A;  . GASTRECTOMY N/A 01/15/2016   Procedure: OPEN PARTIAL GASTRECTOMY;  Surgeon: Stark Klein, MD;  Location: [place];  Service: General;  Laterality: N/A;  . GASTRECTOMY Left 02/24/2016   Procedure: OPEN J TUBE;  Surgeon: Stark Klein, MD;  Location: [place];  Service: General;  Laterality: Left;  [name] GASTROJEJUNOSTOMY N/A 05/18/2016   Procedure: ROUX EN Y GASTROJEJUNOSTOMY;  Surgeon: Stark Klein, MD;  Location: [place];  Service: General;  Laterality: N/A;  . I&D KNEE WITH POLY EXCHANGE Right 12/17/2013   Procedure: IRRIGATION AND DEBRIDEMEN RIGHT TOTAL  KNEE WITH POLY EXCHANGE;  Surgeon: Mauri Pole, MD;  Location: [facility];  Service: Orthopedics;  Laterality: Right;  . IR GENERIC HISTORICAL  06/17/2016   IR RADIOLOGIST EVAL & MGMT 06/17/2016 Ascencion Dike, PA-C [facility] INTERV RAD  . LAPAROSCOPY N/A 01/15/2016   Procedure: LAPAROSCOPY DIAGNOSTIC;  Surgeon: Stark Klein, MD;  Location: [place];  Service: General;   Laterality: N/A;  . LYSIS OF ADHESION  05/18/2016   Procedure: LYSIS OF ADHESION;  Surgeon: Stark Klein, MD;  Location: [place];  Service: General;;  . NEPHRECTOMY Left    renal cell cancer  . PACEMAKER INSERTION  07/04/2011   MDT PPM implanted by Dr Doreatha Lew for tachy/brady; managed by Dr Thompson Grayer   . PATELLAR TENDON REPAIR Right 03/06/2014   Procedure: AVULSION WITH PATELLA TENDON REPAIR;  Surgeon: Mauri Pole, MD;  [facility];  Service: Orthopedics;  Laterality: Right;  . PATENT DUCTUS ARTERIOUS REPAIR  ~ 1949  . TOTAL ABDOMINAL HYSTERECTOMY  85  . TOTAL HIP REVISION Right 2009   initial replacment surg  . TOTAL KNEE ARTHROPLASTY  02/07/2012   Procedure: TOTAL KNEE ARTHROPLASTY;  Surgeon: Mauri Pole, MD;  [facility];  Service: Orthopedics;  Laterality: Right;  . TOTAL KNEE REVISION Right 07/29/2014   Procedure: RIGHT KNEE REVISION OF PREVIOUS REPAIR EXTENSOR MECHANISM;  Surgeon: Mauri Pole, MD;  [facility];  Service: Orthopedics;  Laterality: Right;  . TRANSURETHRAL RESECTION OF BLADDER TUMOR N/A 07/21/2018   Procedure: TRANSURETHRAL RESECTION OF BLADDER TUMOR (TURBT);  Surgeon: Alexis Frock, MD;  [facility];  Service: Urology;  Laterality: N/A;  . TRANSURETHRAL RESECTION OF BLADDER TUMOR WITH MITOMYCIN-C N/A 05/01/2018   Procedure: TRANSURETHRAL RESECTION OF BLADDER TUMOR WITH POST OP INSTILLATION OF GEMCITABINE;  Surgeon: Lucas Mallow, MD;  [facility];  Service: Urology;  Laterality: N/A;  . WHIPPLE PROCEDURE N/A 05/18/2016   Procedure: EXPLORATORY LAPAROTOMY;  Surgeon: Stark Klein, MD;  Location: [place];  Service: General;  Laterality: N/A;   Review of Systems    10 point systems review negative except as above.    Objective:   Physical Exam  BP (!) 158/76   Pulse 68   Temp (!) 97.3 F (36.3 C)   Ht 4\' 11"  (1.499 m)   Wt 97 lb 9.6 oz (44.3 kg)   SpO2 98%   BMI 19.71 kg/m   HEENT -  WNL. PERRLA. No Tempora;l Aa tenderness.  (+)frontal tenderness.  Neck - supple.  Chest - Clear equal BS. Cor - Nl HS. RRR w/o sig MGR. PP 1(+). No edema. Abd - Soft w/o masses or tenderness. MS- FROM w/o deformities.  In wheelchair. Neuro -  Nl w/o focal abnormalities. Skin - No rash, cyanosis, icterus.     Assessment & Plan:   1. Chronic nonintractable headache, unspecified   - dexamethasone (DECADRON) 0.5 MG tablet; Take 1 tab 3 x day - 3 days, then 2 x day - 3 days, then 1 tab daily  Dispense: 20 tablet  - C-reactive protein - Sedimentation rate  - butalbital-acetaminophen-caffeine (FIORICET, ESGIC) 50-325-40 MG tablet; Take 1 tablet every 4 hours  as needed for severe Headache or Back Pain  Dispense: 30 tablet; Refill: 0  2. Labile hypertension  - CBC with Differential/Platelet - COMPLETE METABOLIC PANEL WITH GFR  3. Urinary tract infection without hematuria, site unspecified  - CBC with Differential/Platelet - Urine Culture - Urinalysis, Routine w reflex microscopic  4. Acute midline low back pain without sciatica  - Ambulatory referral to Orthopedic Surgery - butalbital-acetaminophen-caffeine (FIORICET, ESGIC) 50-325-40 MG tablet; Take 1 tablet every 4 hours  as needed for severe Headache or Back Pain  Dispense: 30 tablet; Refill: 0  5. Bursitis of other bursa of right hip  - dexamethasone (DECADRON) 0.5 MG tablet; Take 1 tab 3 x day - 3 days, then 2 x day - 3 days, then 1 tab daily  Dispense: 20 tablet; Refill: 0 - Ambulatory referral to Orthopedic Surgery - butalbital-acetaminophen-caffeine (FIORICET, ESGIC) 50-325-40 MG tablet; Take 1 tablet every 4 hours  as needed for severe Headache or Back Pain  Dispense: 30 tablet  6. Recurrent right knee instability  - Ambulatory referral to Orthopedic Surgery  7. Right leg paresthesias  - Ambulatory referral to Orthopedic Surgery  8. Myalgia  - dexamethasone (DECADRON) 0.5 MG tablet; Take 1 tab 3 x day - 3 days, then 2 x day - 3 days, then 1 tab daily   Dispense: 20 tablet; Refill: 0  9. Diarrhea, unspecified type  - COMPLETE METABOLIC PANEL WITH GFR - Magnesium  10. Medication management  - CBC with Differential/Platelet - COMPLETE METABOLIC PANEL WITH GFR

## 2018-10-17 LAB — URINALYSIS, ROUTINE W REFLEX MICROSCOPIC
Bilirubin Urine: NEGATIVE
Glucose, UA: NEGATIVE
Hgb urine dipstick: NEGATIVE
Ketones, ur: NEGATIVE
Leukocytes,Ua: NEGATIVE
NITRITE: NEGATIVE
PROTEIN: NEGATIVE
Specific Gravity, Urine: 1.021 (ref 1.001–1.03)
pH: 6.5 (ref 5.0–8.0)

## 2018-10-17 LAB — COMPLETE METABOLIC PANEL WITH GFR
AG Ratio: 1.4 (calc) (ref 1.0–2.5)
ALBUMIN MSPROF: 4 g/dL (ref 3.6–5.1)
ALT: 10 U/L (ref 6–29)
AST: 18 U/L (ref 10–35)
Alkaline phosphatase (APISO): 91 U/L (ref 37–153)
BUN: 14 mg/dL (ref 7–25)
CO2: 28 mmol/L (ref 20–32)
CREATININE: 0.8 mg/dL (ref 0.60–0.88)
Calcium: 9.5 mg/dL (ref 8.6–10.4)
Chloride: 104 mmol/L (ref 98–110)
GFR, Est African American: 80 mL/min/{1.73_m2} (ref >=60)
GFR, Est Non African American: 69 mL/min/{1.73_m2} (ref >=60)
Globulin: 2.8 g/dL (calc) (ref 1.9–3.7)
Glucose, Bld: 102 mg/dL — ABNORMAL HIGH (ref 65–99)
Potassium: 4.8 mmol/L (ref 3.5–5.3)
SODIUM: 143 mmol/L (ref 135–146)
Total Bilirubin: 0.6 mg/dL (ref 0.2–1.2)
Total Protein: 6.8 g/dL (ref 6.1–8.1)

## 2018-10-17 LAB — CBC WITH DIFFERENTIAL/PLATELET
ABSOLUTE MONOCYTES: 679 {cells}/uL (ref 200–950)
Basophils Absolute: 37 cells/uL (ref 0–200)
Basophils Relative: 0.5 %
Eosinophils Absolute: 117 cells/uL (ref 15–500)
Eosinophils Relative: 1.6 %
HEMATOCRIT: 45.6 % — AB (ref 35.0–45.0)
HEMOGLOBIN: 15.8 g/dL — AB (ref 11.7–15.5)
Lymphs Abs: 1635 cells/uL (ref 850–3900)
MCH: 33.1 pg — ABNORMAL HIGH (ref 27.0–33.0)
MCHC: 34.6 g/dL (ref 32.0–36.0)
MCV: 95.6 fL (ref 80.0–100.0)
MPV: 10.4 fL (ref 7.5–12.5)
Monocytes Relative: 9.3 %
NEUTROS ABS: 4833 {cells}/uL (ref 1500–7800)
Neutrophils Relative %: 66.2 %
Platelets: 251 10*3/uL (ref 140–400)
RBC: 4.77 10*6/uL (ref 3.80–5.10)
RDW: 13.2 % (ref 11.0–15.0)
Total Lymphocyte: 22.4 %
WBC: 7.3 10*3/uL (ref 3.8–10.8)

## 2018-10-17 LAB — MAGNESIUM: Magnesium: 2.1 mg/dL (ref 1.5–2.5)

## 2018-10-17 LAB — URINE CULTURE
MICRO NUMBER:: 174740
Result:: NO GROWTH
SPECIMEN QUALITY:: ADEQUATE

## 2018-10-17 LAB — SEDIMENTATION RATE: Sed Rate: 2 mm/h (ref 0–30)

## 2018-10-17 LAB — C-REACTIVE PROTEIN: CRP: 0.7 mg/L (ref <8.0)

## 2018-10-20 DIAGNOSIS — Z96651 Presence of right artificial knee joint: Secondary | ICD-10-CM | POA: Diagnosis not present

## 2018-10-20 DIAGNOSIS — Z471 Aftercare following joint replacement surgery: Secondary | ICD-10-CM | POA: Diagnosis not present

## 2018-10-20 DIAGNOSIS — Z96641 Presence of right artificial hip joint: Secondary | ICD-10-CM | POA: Diagnosis not present

## 2018-10-20 DIAGNOSIS — M25551 Pain in right hip: Secondary | ICD-10-CM | POA: Diagnosis not present

## 2018-10-20 DIAGNOSIS — M545 Low back pain: Secondary | ICD-10-CM | POA: Diagnosis not present

## 2018-10-21 ENCOUNTER — Encounter: Payer: Self-pay | Admitting: Internal Medicine

## 2018-10-23 ENCOUNTER — Telehealth: Payer: Self-pay | Admitting: *Deleted

## 2018-10-23 NOTE — Telephone Encounter (Signed)
[name], daughter, called and reported patient saw Dr Alvan Dame and he suggested she return here for a possible referral to a neurologist.  Per Dr Melford Aase, continue the Prednisone and return to be re-evaluated, if her pain is not improved. Daughter is aware.

## 2018-10-24 ENCOUNTER — Telehealth: Payer: Self-pay | Admitting: *Deleted

## 2018-10-24 NOTE — Telephone Encounter (Signed)
Patients daughter left a message to call regarding the pt being on new meds. Returned call to the dtr and she states her mom startred dexamethasone on today. The dose is dexamethasone 0.5mg   And she is taking 3 tabs a days for 3 days then, 2 tabs per day for 3 days, then 1 tab daily until complete for a total of 20 tabs. Pt advised that no changes are needed to Coumadin dosing and that we will see her on this Friday for her scheduled appt.

## 2018-10-27 ENCOUNTER — Ambulatory Visit: Payer: Medicare Other

## 2018-10-27 DIAGNOSIS — I4891 Unspecified atrial fibrillation: Secondary | ICD-10-CM | POA: Diagnosis not present

## 2018-10-27 DIAGNOSIS — Z7901 Long term (current) use of anticoagulants: Secondary | ICD-10-CM | POA: Diagnosis not present

## 2018-10-27 LAB — POCT INR: INR: 2.7 (ref 2.0–3.0)

## 2018-10-27 NOTE — Patient Instructions (Signed)
Description   Continue on same dosage 1 tablet daily except 1.5 tablets on Fridays. Recheck INR in 4 weeks. Call us with any medication changes or concerns # [phone].

## 2018-10-27 NOTE — Progress Notes (Signed)
MEDICARE ANNUAL WELLNESS VISIT AND 3 month follow up  Assessment:    Encounter for Medicare annual wellness exam 1 year  Atrial fibrillation, unspecified type ([place]) Continue coumadin and follow up with cardiology  Adenocarcinoma of stomach ([place]) Continue follow up with GI Takes imodium occ due to diarrhea from radiation  Malignant neoplasm of kidney excluding renal pelvis, unspecified laterality ([place]) Continue to monitor Sister got genetic testing  CKD (chronic kidney disease), stage III ([place]) -     CBC with Differential/Platelet -     COMPLETE METABOLIC PANEL WITH GFR -     TSH  Malignant neoplasm of urinary bladder, unspecified site [facility]) Just got a treatment 3 weeks ago  Hyperlipidemia, unspecified hyperlipidemia type -     Lipid panel -check lipids decrease fatty foods increase activity.   Vitamin B12 deficiency Check B12  Vitamin D deficiency Continue supplement  Long term current use of anticoagulant therapy Continue follow up cardio  History of stroke Control blood pressure, cholesterol, glucose, increase exercise.   S/P partial gastrectomy Monitor  Iron deficiency anemia due to chronic blood loss Check CBC  Refusal of blood transfusions as patient is Jehovah's Witness  Prediabetes Discussed disease progression and risks Discussed diet/exercise, weight management and risk modification  Cardiac pacemaker  Follow up cardio  S/P repair of patent ductus arteriosus Follow up cardio  FHx/o Colon Cancer  Post gastrectomy syndrome  Hypothyroidism, unspecified type -     TSH  Essential hypertension -     CBC with Differential/Platelet -     COMPLETE METABOLIC PANEL WITH GFR  Aortic stenosis, moderate Follow up cardio  Medication management -     Magnesium     Over 40 minutes of exam, counseling, chart review and critical decision making was performed Future Appointments  Date Time Provider [place]  11/24/2018 11:15 AM  [facility]  11/30/2018 10:20 AM [facility] DEVICE REMOTES [facility]  12/08/2018 11:40 AM Patsey Berthold, NP [facility]  01/30/2019  2:00 PM [name], MD [facility] None     Plan:   During the course of the visit the patient was educated and counseled about appropriate screening and preventive services including:    Pneumococcal vaccine   Prevnar 13  Influenza vaccine  Td vaccine  Screening electrocardiogram  Bone densitometry screening  Colorectal cancer screening  Diabetes screening  Glaucoma screening  Nutrition counseling   Advanced directives: requested   Subjective:  [name] is a 85 y.o. female who presents for Medicare Annual Wellness Visit and complete physical.    She had a fall in Feb, has some left leg numbness from it, saw Dr. Alvan Dame, was okay. She walks with a walker.   She had recent partial gastrectomy in May 2019 with subsequent abcess for gastric adenocarcinoma, following with [name] and Dr. Carlean Purl.  Her blood pressure has been controlled at home, today their BP is BP: (!) 148/74  She does not workout. She denies chest pain, shortness of breath, dizziness. She has Afib/PPM and AS s/p pacemaker, follows with Dr. Angelena Form, coumadin checked at clinic, 2 mg every day except 1 day a week takes 3 mg Lab Results  Component Value Date   INR 2.7 10/27/2018   INR 1.76 10/13/2018   INR 3.2 (A) 10/05/2018   She has a history of renal and uterine cancer.   She is not on cholesterol medication and denies myalgias. Her cholesterol is not at goal. The cholesterol last visit was:  Lab Results  Component Value Date   CHOL 128 01/02/2018   HDL 42 (L) 01/02/2018   LDLCALC 67 01/02/2018   TRIG 109 01/02/2018   CHOLHDL 3.0 01/02/2018   Last A1C in the office was:  Lab Results  Component Value Date   HGBA1C 5.0 07/24/2018   Patient is on Vitamin D supplement.   Lab  Results  Component Value Date   VD25OH 46 07/24/2018     She is on thyroid medication. Her medication was not changed last visit.   Lab Results  Component Value Date   TSH 0.93 07/24/2018  .  BMI is Body mass index is 20.76 kg/m.. Wt Readings from Last 3 Encounters:  10/30/18 102 lb 12.8 oz (46.6 kg)  10/16/18 97 lb 9.6 oz (44.3 kg)  07/24/18 99 lb (44.9 kg)   Medication Review: Current Outpatient Medications on File Prior to Visit  Medication Sig Dispense Refill  . acetaminophen (TYLENOL) 500 MG tablet Take 500-1,000 mg by mouth every 6 (six) hours as needed for mild pain or headache.     . ALPRAZolam (XANAX) 0.5 MG tablet TAKE 1/2-1 TABLET BY MOUTH 3 TIMES A DAY AS NEEDED FOR ANXIETY (Patient taking differently: Take 0.25 mg by mouth 3 (three) times daily as needed for anxiety or sleep. ) 90 tablet 0  . Biotin 10 MG CAPS Take 10 mg by mouth daily.     . butalbital-acetaminophen-caffeine (FIORICET, ESGIC) 50-325-40 MG tablet Take 1 tablet every 4 hours  as needed for severe Headache or Back Pain 30 tablet 0  . cephALEXin (KEFLEX) 500 MG capsule Take 500 mg by mouth 3 (three) times daily.    . cetirizine (ZYRTEC) 10 MG tablet Take 10 mg by mouth daily.    . Cholecalciferol (VITAMIN D-3) 5000 units TABS Take 5,000 Units by mouth daily.    [name] dexamethasone (DECADRON) 0.5 MG tablet Take 1 tab 3 x day - 3 days, then 2 x day - 3 days, then 1 tab daily 20 tablet 0  . diltiazem (CARDIZEM CD) 180 MG 24 hr capsule TAKE 1 CAPSULE BY MOUTH EVERY DAY (Patient taking differently: Take 180 mg by mouth daily. ) 90 capsule 1  . famotidine (PEPCID) 40 MG tablet Take 1 tablet (40 mg total) by mouth daily. 90 tablet 3  . gabapentin (NEURONTIN) 300 MG capsule Take 300 mg by mouth 2 (two) times daily. Takes am and lunch    . levothyroxine (SYNTHROID, LEVOTHROID) 100 MCG tablet TAKE 1/2 TABLET BY MOUTH DAILY BEFORE BREAKFAST. (Patient taking differently: Take 50 mcg by mouth daily before breakfast. ) 45  tablet 1  . loperamide (IMODIUM) 2 MG capsule Take by mouth as needed for diarrhea or loose stools. Up to 16 day    . losartan (COZAAR) 25 MG tablet Take 25 mg by mouth daily.    . Magnesium 500 MG TABS Take 500 mg by mouth daily.     . metoprolol tartrate (LOPRESSOR) 100 MG tablet Take 1 tablet (100 mg total) by mouth 2 (two) times daily. 180 tablet 1  . nitroGLYCERIN (NITROSTAT) 0.4 MG SL tablet Place 1 tablet (0.4 mg total) under the tongue every 5 (five) minutes as needed for chest pain (MAX 3 TABLETS). 25 tablet prn  . ondansetron (ZOFRAN-ODT) 8 MG disintegrating tablet Take 8 mg by mouth every 8 (eight) hours as needed for nausea or vomiting.     . ranitidine (ZANTAC) 150 MG tablet Take 150 mg by mouth 2 (two) times daily.    [name]  simethicone (MYLICON) 80 MG chewable tablet Chew 80 mg by mouth every 6 (six) hours as needed for flatulence.    . sucralfate (CARAFATE) 1 g tablet TAKE 1 TABLET (1 G TOTAL) BY MOUTH 4 (FOUR) TIMES DAILY - WITH MEALS AND AT BEDTIME. (Patient taking differently: Take 1 g by mouth 3 (three) times daily. ) 360 tablet 3  . traMADol (ULTRAM) 50 MG tablet Take 1 tablet (50 mg total) by mouth every 6 (six) hours as needed. (Patient taking differently: Take 25-50 mg by mouth every 6 (six) hours as needed. ) 10 tablet 0  . warfarin (COUMADIN) 2 MG tablet TAKE 1 TABLET DAILY OR AS DIRECTED (Patient taking differently: Take 2-3 mg by mouth See admin instructions. Take 3 mg by mouth on Mondays and 2 mg all other days) 90 tablet 1  . furosemide (LASIX) 20 MG tablet TAKE 1 TABLET BY MOUTH EVERY DAY (Patient not taking: Reported on 10/30/2018) 90 tablet 1   No current facility-administered medications on file prior to visit.     Allergies  Allergen Reactions  . Adhesive [Tape] Other (See Comments)    Tears skin, Please use "paper" tape  . Amiodarone Other (See Comments)     Thyroid and liver and kidney problems  . Hydrocodone Other (See Comments)    HALLLUCINATIONS  . Remeron  [Mirtazapine] Other (See Comments)    Hallucinations and make pt loopy  . Codeine Nausea And Vomiting  . Morphine And Related Nausea And Vomiting  . Reglan [Metoclopramide] Hives, Itching and Rash  . Requip [Ropinirole Hcl] Other (See Comments)    Headache   . Zinc Nausea Only  . Other     NO BLOOD PRODUCTS    Current Problems (verified) Patient Active Problem List   Diagnosis Date Noted  . Malignant tumor of urinary bladder ([place]) 05/11/2018  . History of stroke 04/11/2018  . Long term current use of anticoagulant therapy 03/19/2016  . No blood products 02/27/2016  . S/P partial gastrectomy 02/17/2016  . Post gastrectomy syndrome   . Gastroesophageal reflux disease with esophagitis   . B12 deficiency 11/24/2015  . Iron deficiency anemia due to chronic blood loss 11/24/2015  . Refusal of blood transfusions as patient is Jehovah's Witness   . Adenocarcinoma of stomach ([place]) 11/11/2015  . CKD (chronic kidney disease), stage III ([place]) 11/10/2015  . Vitamin B12 deficiency 11/09/2015  . Essential hypertension 06/04/2014  . Prediabetes 09/24/2013  . Vitamin D deficiency 09/24/2013  . Hyperlipemia   . Cardiac pacemaker  08/26/2012  . FHx/o Colon Cancer 07/27/2011  . Aortic stenosis, moderate 07/09/2011  . S/P repair of patent ductus arteriosus 04/22/2011  . Atrial fibrillation ([place]) Chadsvasc 5 12/02/2010  . Malignant neoplasm of kidney excluding renal pelvis ([place]) 11/17/2007  . Hypothyroidism 11/17/2007    Screening Tests Immunization History  Administered Date(s) Administered  . Influenza, High Dose Seasonal PF 07/18/2015, 06/23/2017, 06/27/2018  . Influenza,inj,Quad PF,6+ Mos 05/31/2014, 06/09/2016  . Influenza-Unspecified 09/16/2009, 07/03/2013  . Pneumococcal Conjugate-13 09/06/2002  . Pneumococcal Polysaccharide-23 07/18/2015  . Td 09/06/2002  . Zoster 09/06/2008    Preventative care: Last colonoscopy: 03/2016 Last mammogram: 2015 declines Last pap smear/pelvic  exam: 2004 declines another  DEXA:declines at this time EGD 2018 MRI brain 2005 Echo 2017  Prior vaccinations: TD or Tdap: 2004 declines  Influenza: 2019  Pneumococcal: 2016 Prevnar13: 2016 Shingles/Zostavax: 2010  Names of Other Physician/Practitioners you currently use: 1. [facility] here for primary care 2. Dr.  Eye eye doctor, last visit 2016 3. Dr. Mikeal Hawthorne, dentist, last visit 2015 Patient Care Team: [name], MD as PCP - General (Internal Medicine) [name], [name], MD as PCP - Cardiology (Cardiology) [name], MD as Consulting Physician (Gastroenterology)  SURGICAL HISTORY She  has a past surgical history that includes Cardiac catheterization (2008); Nephrectomy (Left); Patent ductus arterious repair (~ 1949); Cholecystectomy (1970s); Total knee arthroplasty (02/07/2012); Total hip revision (Right, 2009); I&D knee with poly exchange (Right, 12/17/2013); Patellar tendon repair (Right, 03/06/2014); Total knee revision (Right, 07/29/2014); Colonoscopy (N/A, 11/11/2015); Esophagogastroduodenoscopy (N/A, 11/11/2015); Total abdominal hysterectomy (85); laparoscopy (N/A, 01/15/2016); Gastrectomy (N/A, 01/15/2016); Pacemaker insertion (07/04/2011); Esophagogastroduodenoscopy (N/A, 02/05/2016); Gastrectomy (Left, 02/24/2016); Esophagogastroduodenoscopy (N/A, 04/02/2016); Whipple procedure (N/A, 05/18/2016); Gastrojejunostomy (N/A, 05/18/2016); Lysis of adhesion (05/18/2016); ir generic historical (06/17/2016); Esophagogastroduodenoscopy (egd) with propofol (N/A, 04/06/2017); Transurethral resection of bladder tumor with mitomycin-c (N/A, 05/01/2018); Cystoscopy w/ retrogrades (Right, 05/01/2018); Transurethral resection of bladder tumor (N/A, 07/21/2018); and Cystoscopy w/ retrogrades (Right, 07/21/2018). FAMILY HISTORY Her family history includes Colon cancer (age of onset: 51) in her mother; Heart attack in her father; Heart disease in her father, maternal  grandmother, and mother. SOCIAL HISTORY She  reports that she has never smoked. She has never used smokeless tobacco. She reports that she does not drink alcohol or use drugs.   MEDICARE WELLNESS OBJECTIVES: Physical activity: Current Exercise Habits: The patient does not participate in regular exercise at present Cardiac risk factors: Cardiac Risk Factors include: advanced age (>58men, >13 women);dyslipidemia;hypertension;sedentary lifestyle Depression/mood screen:   Depression screen [facility] 2/9 10/30/2018  Decreased Interest 0  Down, Depressed, Hopeless 0  PHQ - 2 Score 0  Some recent data might be hidden    ADLs:  In your present state of health, do you have any difficulty performing the following activities: 10/30/2018 10/21/2018  Hearing? Y N  Comment wears hearing aids -  Vision? N N  Difficulty concentrating or making decisions? N N  Walking or climbing stairs? Y N  Comment walks with walker -  Dressing or bathing? N N  Doing errands, shopping? Aggie Moats  Comment daughter drives her -  Conservation officer, nature and eating ? N -  Comment lives with daughter -  Using the Toilet? Y -  In the past six months, have you accidently leaked urine? Y -  Do you have problems with loss of bowel control? N -  Managing your Medications? Y -  Comment daughter does them -  Managing your Finances? Y -  Housekeeping or managing your Housekeeping? Y -  Some recent data might be hidden     Cognitive Testing  Alert? Yes  Normal Appearance?Yes  Oriented to person? Yes  Place? Yes   Time? Yes  Recall of three objects?  Yes  Can perform simple calculations? Yes  Displays appropriate judgment?Yes  Can read the correct time from a watch face?Yes  EOL planning: Does Patient Have a Medical Advance Directive?: Yes Type of Advance Directive: Out of facility DNR (pink MOST or yellow form)  Review of Systems  Constitutional: Negative.   HENT: Negative.   Eyes: Negative.   Respiratory: Negative.    Cardiovascular: Negative.   Gastrointestinal: Negative.   Genitourinary: Negative.   Musculoskeletal: Negative.   Skin: Negative.   Neurological: Negative.   Endo/Heme/Allergies: Negative.   Psychiatric/Behavioral: Negative.      Objective:     Today's Vitals   10/30/18 1044  BP: (!) 148/74  Pulse: 64  Temp: (!) 97.5 F (36.4 C)  SpO2: 99%  Weight: 102 lb 12.8 oz (46.6 kg)  Height: 4\' 11"  (1.499 m)   Body mass index is 20.76 kg/m.  General appearance: alert, no distress, WD/WN, female HEENT: normocephalic, sclerae anicteric, TMs pearly, nares patent, no discharge or erythema, pharynx normal Oral cavity: MMM, no lesions Neck: supple, no lymphadenopathy, no thyromegaly, no masses Heart: RRR, normal S1, S2, systolic murmur Lungs: CTA bilaterally, no wheezes, rhonchi, or rales Abdomen: +bs, soft,  non tender, non distended, no masses, no hepatomegaly, no splenomegaly Musculoskeletal: nontender, no swelling, no obvious deformity Extremities: no edema, no cyanosis, no clubbing Pulses: 2+ symmetric, upper and lower extremities, normal cap refill Neurological: alert, oriented x 3, CN2-12 intact, strength normal upper extremities and lower extremities, sensation normal throughout, DTRs 2+ throughout, no cerebellar signs, gait antalgic with walker Psychiatric: normal affect, behavior normal, pleasant   Medicare Attestation I have personally reviewed: The patient's medical and social history Their use of alcohol, tobacco or illicit drugs Their current medications and supplements The patient's functional ability including ADLs,fall risks, home safety risks, cognitive, and hearing and visual impairment Diet and physical activities Evidence for depression or mood disorders  The patient's weight, height, BMI, and visual acuity have been recorded in the chart.  I have made referrals, counseling, and provided education to the patient based on review of the above and I have provided  the patient with a written personalized care plan for preventive services.     Vicie Mutters, PA-C   10/30/2018

## 2018-10-30 ENCOUNTER — Encounter: Payer: Self-pay | Admitting: Physician Assistant

## 2018-10-30 ENCOUNTER — Ambulatory Visit (INDEPENDENT_AMBULATORY_CARE_PROVIDER_SITE_OTHER): Payer: Medicare Other | Admitting: Physician Assistant

## 2018-10-30 VITALS — BP 148/74 | HR 64 | Temp 97.5°F | Ht 59.0 in | Wt 102.8 lb

## 2018-10-30 DIAGNOSIS — C679 Malignant neoplasm of bladder, unspecified: Secondary | ICD-10-CM | POA: Diagnosis not present

## 2018-10-30 DIAGNOSIS — R6889 Other general symptoms and signs: Secondary | ICD-10-CM

## 2018-10-30 DIAGNOSIS — I350 Nonrheumatic aortic (valve) stenosis: Secondary | ICD-10-CM

## 2018-10-30 DIAGNOSIS — C649 Malignant neoplasm of unspecified kidney, except renal pelvis: Secondary | ICD-10-CM

## 2018-10-30 DIAGNOSIS — Z8673 Personal history of transient ischemic attack (TIA), and cerebral infarction without residual deficits: Secondary | ICD-10-CM

## 2018-10-30 DIAGNOSIS — Z0000 Encounter for general adult medical examination without abnormal findings: Secondary | ICD-10-CM

## 2018-10-30 DIAGNOSIS — E559 Vitamin D deficiency, unspecified: Secondary | ICD-10-CM

## 2018-10-30 DIAGNOSIS — Z7901 Long term (current) use of anticoagulants: Secondary | ICD-10-CM

## 2018-10-30 DIAGNOSIS — E538 Deficiency of other specified B group vitamins: Secondary | ICD-10-CM

## 2018-10-30 DIAGNOSIS — Z0001 Encounter for general adult medical examination with abnormal findings: Secondary | ICD-10-CM | POA: Diagnosis not present

## 2018-10-30 DIAGNOSIS — I10 Essential (primary) hypertension: Secondary | ICD-10-CM | POA: Diagnosis not present

## 2018-10-30 DIAGNOSIS — Z800 Family history of malignant neoplasm of digestive organs: Secondary | ICD-10-CM

## 2018-10-30 DIAGNOSIS — I4891 Unspecified atrial fibrillation: Secondary | ICD-10-CM

## 2018-10-30 DIAGNOSIS — E785 Hyperlipidemia, unspecified: Secondary | ICD-10-CM

## 2018-10-30 DIAGNOSIS — Z79899 Other long term (current) drug therapy: Secondary | ICD-10-CM | POA: Diagnosis not present

## 2018-10-30 DIAGNOSIS — N183 Chronic kidney disease, stage 3 unspecified: Secondary | ICD-10-CM

## 2018-10-30 DIAGNOSIS — K911 Postgastric surgery syndromes: Secondary | ICD-10-CM

## 2018-10-30 DIAGNOSIS — Z950 Presence of cardiac pacemaker: Secondary | ICD-10-CM

## 2018-10-30 DIAGNOSIS — R7303 Prediabetes: Secondary | ICD-10-CM

## 2018-10-30 DIAGNOSIS — Z8774 Personal history of (corrected) congenital malformations of heart and circulatory system: Secondary | ICD-10-CM

## 2018-10-30 DIAGNOSIS — IMO0001 Reserved for inherently not codable concepts without codable children: Secondary | ICD-10-CM

## 2018-10-30 DIAGNOSIS — D500 Iron deficiency anemia secondary to blood loss (chronic): Secondary | ICD-10-CM

## 2018-10-30 DIAGNOSIS — C169 Malignant neoplasm of stomach, unspecified: Secondary | ICD-10-CM

## 2018-10-30 DIAGNOSIS — Z903 Acquired absence of stomach [part of]: Secondary | ICD-10-CM

## 2018-10-30 DIAGNOSIS — Z531 Procedure and treatment not carried out because of patient's decision for reasons of belief and group pressure: Secondary | ICD-10-CM

## 2018-10-30 DIAGNOSIS — E039 Hypothyroidism, unspecified: Secondary | ICD-10-CM | POA: Diagnosis not present

## 2018-10-30 NOTE — Patient Instructions (Signed)
GENERAL HEALTH GOALS  Know what a healthy weight is for you (roughly BMI <25) and aim to maintain this  Aim for 7+ servings of fruits and vegetables daily  70-80+ fluid ounces of water or unsweet tea for healthy kidneys  Limit to max 1 drink of alcohol per day; avoid smoking/tobacco  Limit animal fats in diet for cholesterol and heart health - choose grass fed whenever available  Avoid highly processed foods, and foods high in saturated/trans fats  Aim for low stress - take time to unwind and care for your mental health  Aim for 150 min of moderate intensity exercise weekly for heart health, and weights twice weekly for bone health  Aim for 7-9 hours of sleep daily   Producer, television/film/video Information Description of Services Cost  A Matter of Balance Class locations vary. Call [place] on Aging for more information.  [email or web address]-[email or web address]/ [phone] 8-Session program addressing the fear of falling and increasing activity levels of older adults Free to minimal cost  A.C.T. By The [employer] [address], [place], [place] [address].  BetaBlues.dk [phone]  Personal training, gym, classes including Silver Sneakers* and ACTion for Aging Adults Fee-based  A.H.O.Y. (Add Health to [place]) Airs on Time [employer] 13, M-F at [place]: [employer],  [place] [facility] [place],  [place], [facility], [address] [facility] [place], [place], [place], [place] [address]  [place] Location: Sharrell Ku. [employer] [place]       [phone]  [phone]  [phone]  [phone]  [phone]  [phone]  [phone]  [phone]  [phone]  [phone]    [phone] A total-body conditioning class for adults 51 and older; designed to increase muscular strength, endurance, range of movement, flexibility, balance, agility and coordination Free  [facility] [place], [place] [address] [place]      [address]      [phone]      Pilate's class for individualsreturning to exercise after an injury, before or after surgery or for individuals with complex musculoskeletal issues; designed to improve strength, balance , flexibility      $15/class  [place] [address] [place], [place] [address] [email or web address] [place] classes for beginners to advanced [place],  [address] Seniorcenter'[email or web address] [place] Chair Exercises Free, ages 24 and older; Ages 44-59 fee based  Marvia Pickles, [facility] [address] [place],  [address] Seniorcenter'[email or web address] .Beverlee Nims [phone]  A.H.O.Y. Tai Chi Fee-based Donation based or free  Forbes Class locations vary.  Call or email Angela Burke or view website for more information. [email or web address] .[email or web address] [email or web address] [phone] Ongoing classes at local YMCAs and gyms Fee-based  Silver Sneakers A.C.T. By [place] [employer]: [place] [facility] [place] [facility]  [facility] [place] [employer] [phone]  [phone]  [phone] Classes designed for older adults who want to improve their strength, flexibility, balance and endurance.   Silver sneakers is covered by some insurance plans and includes a fitness center membership at participating locations. Find out more by calling [phone] or visiting [email or web address] Covered by some insurance plans  [facility] [place] [phone] A.H.O.Y., fitness room, personal training, fitness classes for injury prevention, strength, balance, flexibility, water fitness classes Ages 55+: $16 for 6 months; Ages 39-54: $84 for 6 months  Tai Chi for Everybody [facility] [address] [place], [place] [address] Taichiforeverybody'[email or web address] .Patsi Sears [phone] Tai Chi classes for beginners to advanced; geared for seniors Donation Based      [employer] (Helpling Others Participate in Exercise     Loyal Gambler. Rosana Hoes, PhD, [place] [email or web address] [place]     [phone]     A comprehensive fitness program for adults.  The program paris senior-level undergraduates Kinesiology students with adults who desire to learn how to exercise safely.  Includes a structural exercise class focusing on functional fitnesss     $100/semester in fall and spring; $75 in summer (no trainers)    *Silver Sneakers is covered by some Personal assistant and includes a  Radio producer at participating locations.  Find out more by calling [phone] or visiting [email or web address]  For additional health and human services resources for senior adults, please contact SeniorLine at [phone] in [place] and [place] at [phone] in all other areas.

## 2018-10-31 ENCOUNTER — Other Ambulatory Visit: Payer: Self-pay | Admitting: Physician Assistant

## 2018-10-31 LAB — CBC WITH DIFFERENTIAL/PLATELET
Absolute Monocytes: 474 cells/uL (ref 200–950)
Basophils Absolute: 28 cells/uL (ref 0–200)
Basophils Relative: 0.3 %
Eosinophils Absolute: 65 cells/uL (ref 15–500)
Eosinophils Relative: 0.7 %
HCT: 39.1 % (ref 35.0–45.0)
Hemoglobin: 13.3 g/dL (ref 11.7–15.5)
Lymphs Abs: 1042 cells/uL (ref 850–3900)
MCH: 33.1 pg — ABNORMAL HIGH (ref 27.0–33.0)
MCHC: 34 g/dL (ref 32.0–36.0)
MCV: 97.3 fL (ref 80.0–100.0)
MPV: 10.1 fL (ref 7.5–12.5)
Monocytes Relative: 5.1 %
Neutro Abs: 7691 cells/uL (ref 1500–7800)
Neutrophils Relative %: 82.7 %
Platelets: 259 10*3/uL (ref 140–400)
RBC: 4.02 10*6/uL (ref 3.80–5.10)
RDW: 13.2 % (ref 11.0–15.0)
Total Lymphocyte: 11.2 %
WBC: 9.3 10*3/uL (ref 3.8–10.8)

## 2018-10-31 LAB — COMPLETE METABOLIC PANEL WITH GFR
AG Ratio: 1.4 (calc) (ref 1.0–2.5)
ALBUMIN MSPROF: 3.6 g/dL (ref 3.6–5.1)
ALT: 16 U/L (ref 6–29)
AST: 18 U/L (ref 10–35)
Alkaline phosphatase (APISO): 78 U/L (ref 37–153)
BUN: 22 mg/dL (ref 7–25)
CO2: 24 mmol/L (ref 20–32)
Calcium: 8.7 mg/dL (ref 8.6–10.4)
Chloride: 109 mmol/L (ref 98–110)
Creat: 0.73 mg/dL (ref 0.60–0.88)
GFR, Est African American: 89 mL/min/{1.73_m2} (ref >=60)
GFR, Est Non African American: 77 mL/min/{1.73_m2} (ref >=60)
GLOBULIN: 2.6 g/dL (ref 1.9–3.7)
Glucose, Bld: 86 mg/dL (ref 65–99)
Potassium: 3.5 mmol/L (ref 3.5–5.3)
SODIUM: 142 mmol/L (ref 135–146)
Total Bilirubin: 0.4 mg/dL (ref 0.2–1.2)
Total Protein: 6.2 g/dL (ref 6.1–8.1)

## 2018-10-31 LAB — LIPID PANEL
CHOL/HDL RATIO: 3.3 (calc) (ref <5.0)
Cholesterol: 143 mg/dL (ref <200)
HDL: 43 mg/dL — ABNORMAL LOW (ref >=50)
LDL Cholesterol (Calc): 76 mg/dL (calc)
Non-HDL Cholesterol (Calc): 100 mg/dL (calc) (ref <130)
Triglycerides: 143 mg/dL (ref <150)

## 2018-10-31 LAB — MAGNESIUM: MAGNESIUM: 1.5 mg/dL (ref 1.5–2.5)

## 2018-10-31 LAB — TSH: TSH: 1.07 mIU/L (ref 0.40–4.50)

## 2018-11-06 ENCOUNTER — Other Ambulatory Visit: Payer: Self-pay | Admitting: Adult Health

## 2018-11-07 ENCOUNTER — Encounter: Payer: Self-pay | Admitting: Internal Medicine

## 2018-11-07 ENCOUNTER — Other Ambulatory Visit: Payer: Self-pay | Admitting: Internal Medicine

## 2018-11-08 ENCOUNTER — Telehealth: Payer: Self-pay | Admitting: *Deleted

## 2018-11-08 NOTE — Telephone Encounter (Signed)
Patient's daughter called and reported the patient continues to have numbness in her left leg and has caused her to fall.  Patient has been seen by her orthopedist, Dr Alvan Dame, who told her it is not an orthopedic problem.  Patient is requesting a referral to a neurologist, as suggested by Dr Alvan Dame.  It is OK to refer her to neurology, per Dr Melford Aase.

## 2018-11-23 ENCOUNTER — Telehealth: Payer: Self-pay | Admitting: *Deleted

## 2018-11-23 NOTE — Telephone Encounter (Signed)
1. Have you recently travelled abroad or to [place], [place], or [place]? NO 2. Do you currently have a fever? NO 3. Have you been in contact with someone that is currently pending confirmation of COVID19 testing or has been confirmed to have the COVID19 virus? NO 4. Are you currently experiencing fatigue or cough? NO 5. Are you currently experiencing new or worsening shortness of breath at rest or with minimal activity? NO  Have you been in contact with someone that was recently sick with fever/cough/fatigue? NO  A score of 4 or more should result in cancellation of the pts cardiology appt  A score of 2 should be provided a mask prior to admission into the lobby  **TRAVEL to a high risk area or contact with a confirmed case should stay at home, away from confirmed patient, monitor symptoms, and reach out to PCP for evisit, additional testing.   **ALL PTS WITH FEVER SHOULD BE REFERRED TO PCP FOR EVISIT  Pt advised that we are restricting visitors at this time and request that only patients present for check-in prior to their appointment. All other visitors should remain in their car. If necessary, only one visitor may come with the patient into the building. For everyone's safety, all patients and visitors entering our practice area should expect to be screened again prior to entering our waiting area.   Spoke with pt's dtr and she advised since she has cancer to please wear her mask since she is willing to bring pt to appt. She stated that if this changes they would call back.

## 2018-11-24 ENCOUNTER — Other Ambulatory Visit: Payer: Self-pay

## 2018-11-24 ENCOUNTER — Other Ambulatory Visit: Payer: Self-pay | Admitting: Internal Medicine

## 2018-11-24 ENCOUNTER — Ambulatory Visit (INDEPENDENT_AMBULATORY_CARE_PROVIDER_SITE_OTHER): Payer: Medicare Other | Admitting: *Deleted

## 2018-11-24 DIAGNOSIS — I4891 Unspecified atrial fibrillation: Secondary | ICD-10-CM | POA: Diagnosis not present

## 2018-11-24 DIAGNOSIS — Z7901 Long term (current) use of anticoagulants: Secondary | ICD-10-CM | POA: Diagnosis not present

## 2018-11-24 LAB — POCT INR: INR: 2.6 (ref 2.0–3.0)

## 2018-11-24 NOTE — Patient Instructions (Signed)
Description   Continue on same dosage 1 tablet daily except 1.5 tablets on Fridays. Recheck INR in 5 weeks. Call us with any medication changes or concerns # [phone].

## 2018-11-30 ENCOUNTER — Ambulatory Visit (INDEPENDENT_AMBULATORY_CARE_PROVIDER_SITE_OTHER): Payer: Medicare Other | Admitting: *Deleted

## 2018-11-30 ENCOUNTER — Other Ambulatory Visit: Payer: Self-pay

## 2018-11-30 DIAGNOSIS — I495 Sick sinus syndrome: Secondary | ICD-10-CM

## 2018-11-30 LAB — CUP PACEART REMOTE DEVICE CHECK
Battery Impedance: 1607 Ohm
Battery Remaining Longevity: 39 mo
Battery Voltage: 2.78 V
Brady Statistic AP VS Percent: 69 %
Brady Statistic AS VP Percent: 0 %
Brady Statistic AS VS Percent: 31 %
Date Time Interrogation Session: 20200326152606
Implantable Lead Implant Date: 20111027
Implantable Lead Implant Date: 20111027
Implantable Lead Location: 753859
Implantable Lead Location: 753860
Implantable Lead Model: 4469
Implantable Lead Serial Number: 548229
Implantable Lead Serial Number: 686026
Implantable Pulse Generator Implant Date: 20111027
Lead Channel Impedance Value: 364 Ohm
Lead Channel Pacing Threshold Amplitude: 0.375 V
Lead Channel Pacing Threshold Amplitude: 2.5 V
Lead Channel Pacing Threshold Pulse Width: 0.4 ms
Lead Channel Pacing Threshold Pulse Width: 0.4 ms
Lead Channel Setting Pacing Amplitude: 2 V
Lead Channel Setting Pacing Amplitude: 5 V
Lead Channel Setting Pacing Pulse Width: 0.4 ms
Lead Channel Setting Sensing Sensitivity: 2 mV
MDC IDC MSMT LEADCHNL RA IMPEDANCE VALUE: 452 Ohm
MDC IDC STAT BRADY AP VP PERCENT: 0 %

## 2018-12-05 ENCOUNTER — Encounter: Payer: Self-pay | Admitting: Cardiology

## 2018-12-05 NOTE — Progress Notes (Signed)
Remote pacemaker transmission.   

## 2018-12-08 ENCOUNTER — Encounter: Payer: Medicare Other | Admitting: Nurse Practitioner

## 2018-12-19 ENCOUNTER — Other Ambulatory Visit: Payer: Self-pay | Admitting: Internal Medicine

## 2018-12-19 ENCOUNTER — Telehealth: Payer: Self-pay | Admitting: Physician Assistant

## 2018-12-19 NOTE — Telephone Encounter (Signed)
Spoke with her daughter Beverlee Nims.  Patient is doing well.  No medication questions or issues at this time.  Declines CCM.

## 2018-12-20 ENCOUNTER — Other Ambulatory Visit: Payer: Self-pay

## 2018-12-20 ENCOUNTER — Ambulatory Visit: Payer: Medicare Other | Admitting: Internal Medicine

## 2018-12-20 VITALS — BP 138/78 | HR 82 | Temp 97.3°F | Wt 97.8 lb

## 2018-12-20 DIAGNOSIS — H6523 Chronic serous otitis media, bilateral: Secondary | ICD-10-CM

## 2018-12-20 DIAGNOSIS — J301 Allergic rhinitis due to pollen: Secondary | ICD-10-CM | POA: Diagnosis not present

## 2018-12-20 MED ORDER — DEXAMETHASONE 0.5 MG PO TABS: ORAL_TABLET | ORAL | 0 refills | Status: DC

## 2018-12-20 NOTE — Progress Notes (Signed)
THIS ENCOUNTER IS A VIRTUAL VISIT DUE TO COVID-19 - PATIENT WAS NOT SEEN IN THE OFFICE.  PATIENT HAS CONSENTED TO VIRTUAL VISIT / TELEMEDICINE VISIT  Virtual Visit via telephone Note  I connected with patient's caregiver daughter [name]  on 12/20/2018   by telephone.  I verified that I am speaking with the correct person using two identifiers.    I discussed the limitations of evaluation and management by telemedicine and the availability of in person appointments. The patient's daughter expressed understanding and agreed to proceed.  History of Present Illness:     Patient's daughter relayed her mother's sx's of exacerbation of her usual spring allergies with sneezing, itchy watery eyes & nose with clear watery nasal drainage and sensation that her ears are stopped up with sounds dampened. Cough has been dry & non productive. There has been no reported fever, chills, rash or dyspnea. [name] relates that her mother has been quarantined for ove r3 weeks and has had no visitors to the home and [name] has picked up groceries at the grocery store.    Medications  .  levothyroxine (SYNTHROID, LEVOTHROID) 100 MCG tablet, TAKE 1/2 TABLET BY MOUTH DAILY BEFORE BREAKFAST. (Patient taking differently: Take 50 mcg by mouth daily before breakfast. ) .  diltiazem (CARDIZEM CD) 180 MG 24 hr capsule, TAKE 1 CAPSULE BY MOUTH EVERY DAY .  furosemide (LASIX) 20 MG tablet, TAKE 1 TABLET BY MOUTH EVERY DAY .  losartan (COZAAR) 25 MG tablet, Take 25 mg by mouth daily. .  metoprolol tartrate (LOPRESSOR) 100 MG tablet, TAKE 1 TABLET BY MOUTH TWICE A DAY .  nitroGLYCERIN (NITROSTAT) 0.4 MG SL tablet, Place 1 tablet (0.4 mg total) under the tongue every 5 (five) minutes as needed for chest pain (MAX 3 TABLETS). .  cetirizine (ZYRTEC) 10 MG tablet, Take 10 mg by mouth daily. [name]  acetaminophen (TYLENOL) 500 MG tablet, Take 500-1,000 mg by mouth every 6 (six) hours as needed for mild pain or headache.  .  warfarin  (COUMADIN) 2 MG tablet, TAKE 1 TABLET BY MOUTH DAILY OR AS DIRECTED .  Biotin 10 MG CAPS, Take 10 mg by mouth daily.  .  Cholecalciferol (VITAMIN D-3) 5000 units TABS, Take 5,000 Units by mouth daily. .  famotidine (PEPCID) 40 MG tablet, Take 1 tablet (40 mg total) by mouth daily. [name]  gabapentin (NEURONTIN) 300 MG capsule, TAKE 1 CAPSULE BY MOUTH THREE TIMES A DAY .  loperamide (IMODIUM) 2 MG capsule, Take by mouth as needed for diarrhea or loose stools. Up to 16 day .  ondansetron (ZOFRAN-ODT) 8 MG disintegrating tablet, Take 8 mg by mouth every 8 (eight) hours as needed for nausea or vomiting.  .  simethicone (MYLICON) 80 MG chewable tablet, Chew 80 mg by mouth every 6 (six) hours as needed for flatulence. .  sucralfate (CARAFATE) 1 g tablet, TAKE 1 TABLET (1 G TOTAL) BY MOUTH 4 (FOUR) TIMES DAILY - WITH MEALS AND AT BEDTIME. (Patient taking differently: Take 1 g by mouth 3 (three) times daily. )  Problem list She has Malignant neoplasm of kidney excluding renal pelvis ([place]); Hypothyroidism; Atrial fibrillation ([place]) Chadsvasc 5; S/P repair of patent ductus arteriosus; Aortic stenosis, moderate; FHx/o Colon Cancer; Cardiac pacemaker ; Hyperlipemia; Prediabetes; Vitamin D deficiency; Essential hypertension; Vitamin B12 deficiency; CKD (chronic kidney disease), stage III ([place]); Adenocarcinoma of stomach ([place]); Refusal of blood transfusions as patient is Jehovah's Witness; B12 deficiency; Iron deficiency anemia due to chronic blood loss; Gastroesophageal reflux disease  with esophagitis; Post gastrectomy syndrome; S/P partial gastrectomy; No blood products; Long term current use of anticoagulant therapy; History of stroke; and Malignant tumor of urinary bladder (HCC) on their problem list.   Observations/Objective:  BP 138/78   Pulse 82   Temp (!) 97.3 F (36.3 C)   Wt 97 lb 12.8 oz (44.4 kg)   BMI 19.75 kg/m   General : Well sounding patient in no apparent distress HEENT: no hoarseness, no  cough for duration of visit Lungs: speaks in complete sentences, no audible wheezing, no apparent distress Neurological: alert, oriented x 3 Psychiatric: pleasant, judgement appropriate   Assessment and Plan:  1. Seasonal allergic rhinitis due to pollen  - dexamethasone (DECADRON) 0.5 MG tablet; Take 1 tab 3 x day - 3 days, then 2 x day - 3 days, then 1 tab daily  Dispense: 20 tablet  2. Bilateral chronic serous otitis media  - dexamethasone (DECADRON) 0.5 MG tablet; Take 1 tab 3 x day - 3 days, then 2 x day - 3 days, then 1 tab daily  Dispense: 20 tablet; Refill: 0  Follow Up Instructions:  - recommended continue Cetirizine and also recommended purchase Decongestabs (Phenylephrine).    I discussed the assessment and treatment plan with the patient. The patient was provided an opportunity to ask questions and all were answered. The patient agreed with the plan and demonstrated an understanding of the instructions.   The patient was advised to call back or seek an in-person evaluation if the symptoms worsen or if the condition fails to improve as anticipated, especially if develop fever or SOB.  I provided 13minutes of non-face-to-face time, chart review and documentation during this encounter.  Kirtland Bouchard, MD

## 2018-12-27 ENCOUNTER — Telehealth: Payer: Self-pay

## 2018-12-27 NOTE — Telephone Encounter (Signed)

## 2018-12-29 ENCOUNTER — Other Ambulatory Visit: Payer: Self-pay

## 2018-12-29 ENCOUNTER — Ambulatory Visit (INDEPENDENT_AMBULATORY_CARE_PROVIDER_SITE_OTHER): Payer: Medicare Other | Admitting: Pharmacist

## 2018-12-29 ENCOUNTER — Telehealth: Payer: Self-pay | Admitting: Internal Medicine

## 2018-12-29 DIAGNOSIS — I4891 Unspecified atrial fibrillation: Secondary | ICD-10-CM | POA: Diagnosis not present

## 2018-12-29 DIAGNOSIS — Z7901 Long term (current) use of anticoagulants: Secondary | ICD-10-CM

## 2018-12-29 LAB — POCT INR: INR: 2.9 (ref 2.0–3.0)

## 2018-12-29 NOTE — Telephone Encounter (Signed)
New Message           Patient's daughter is calling because her mom is going in Afib bp 145/89 HR 109. Patient's daughter is worried and need a call back

## 2019-01-01 ENCOUNTER — Encounter: Payer: Self-pay | Admitting: *Deleted

## 2019-01-01 ENCOUNTER — Telehealth: Payer: Self-pay | Admitting: *Deleted

## 2019-01-01 ENCOUNTER — Encounter: Payer: Medicare Other | Admitting: *Deleted

## 2019-01-01 LAB — CUP PACEART REMOTE DEVICE CHECK
Battery Impedance: 1666 Ohm
Battery Remaining Longevity: 42 mo
Battery Voltage: 2.77 V
Brady Statistic AP VP Percent: 0 %
Brady Statistic AP VS Percent: 67 %
Brady Statistic AS VP Percent: 0 %
Brady Statistic AS VS Percent: 33 %
Date Time Interrogation Session: 20200427112728
Implantable Lead Implant Date: 20111027
Implantable Lead Implant Date: 20111027
Implantable Lead Location: 753859
Implantable Lead Location: 753860
Implantable Lead Model: 4469
Implantable Lead Model: 4470
Implantable Lead Serial Number: 548229
Implantable Lead Serial Number: 686026
Implantable Pulse Generator Implant Date: 20111027
Lead Channel Impedance Value: 380 Ohm
Lead Channel Impedance Value: 418 Ohm
Lead Channel Pacing Threshold Amplitude: 0.375 V
Lead Channel Pacing Threshold Amplitude: 1.25 V
Lead Channel Pacing Threshold Pulse Width: 0.4 ms
Lead Channel Pacing Threshold Pulse Width: 0.4 ms
Lead Channel Setting Pacing Amplitude: 2 V
Lead Channel Setting Pacing Amplitude: 3 V
Lead Channel Setting Pacing Pulse Width: 0.4 ms
Lead Channel Setting Sensing Sensitivity: 2 mV

## 2019-01-01 NOTE — Telephone Encounter (Signed)
Returned call to daughter.  Daughter called PCP on Friday and was advised to double diltiazem.  Daughter states pt is back in SR now, but she is very tired.  Asked Pt to send remote for f/u on 4/29 with Dr. Rayann Heman.

## 2019-01-01 NOTE — Telephone Encounter (Signed)
[name], patient's daughter, called and reported the patient's pulse increased to 120 bpm on 12/27/2018, at the end of her Prednisone dose taper.  The patient's pulse was 114 on 12/28/2018 and is currently 83 this AM, with a BP of 122/91.  Dr Melford Aase recommended the patient increase her Diltiazem 180 mg to 1 capsule twice a day and the daughter was advised to monitor the patient's BP and pulse.

## 2019-01-01 NOTE — Telephone Encounter (Signed)
Called daughter Cline Cools, on [place] and advised her Dde to current COVID 19 pandemic, our office is severely reducing in person visits in order to minimize the risk to our patients and healthcare providers. We recommend to convert patient's appointment to a video visit. She stated her mother lives with her sister, [name] and asked that I call [name]. I spoke with [name] and explained that due to current COVID 19 pandemic, our office is severely reducing in person visits in order to minimize the risk to our patients and healthcare providers. We recommend to convert patient's  appointment to a video visit. [name] discussed with her mother who stated she would rather come into office. We rescheduled her for late June, Brenda  verbalized understanding, appreciation.

## 2019-01-03 ENCOUNTER — Telehealth (INDEPENDENT_AMBULATORY_CARE_PROVIDER_SITE_OTHER): Payer: Medicare Other | Admitting: Internal Medicine

## 2019-01-03 ENCOUNTER — Telehealth: Payer: Self-pay

## 2019-01-03 VITALS — BP 119/74 | HR 76 | Wt 191.8 lb

## 2019-01-03 DIAGNOSIS — I495 Sick sinus syndrome: Secondary | ICD-10-CM

## 2019-01-03 DIAGNOSIS — I480 Paroxysmal atrial fibrillation: Secondary | ICD-10-CM | POA: Diagnosis not present

## 2019-01-03 DIAGNOSIS — I10 Essential (primary) hypertension: Secondary | ICD-10-CM | POA: Diagnosis not present

## 2019-01-03 NOTE — Telephone Encounter (Signed)
Spoke with pt daughter regarding appt on 01/03/19. Pt daughter stated pt will check vitals prior to appt. Pt questions and concerns werw address.

## 2019-01-03 NOTE — Progress Notes (Signed)
Electrophysiology TeleHealth Note   Due to national recommendations of social distancing due to COVID 19, an audio/video telehealth visit is felt to be most appropriate for this patient at this time.  See MyChart message from today for the patient's consent to telehealth for [facility].   Date:  01/03/2019   ID:  [name], DOB [birth year], MRN [id number]  Location: patient's home  Provider location: [address] [place]  Evaluation Performed: Follow-up visit  PCP:  [name], MD  Cardiologist:  [name], MD  Electrophysiologist:  Dr Rayann Heman  Chief Complaint:  afib  History of Present Illness:    [name] is a 85 y.o. female who presents via audio/video conferencing for a telehealth visit today.  Since last being seen in our clinic, the patient reports doing very well.  She has had difficulty with afib, edema, and SOB.  She has noticed some weight gain.  She has recently been on prednisone for allergies.  This seems to have coincided with her afib. She has increased her diltiazem and is now doing better.  prednisone has also discontinued.  Today, she denies symptoms of  chest pain, dizziness, presyncope, or syncope.  The patient is otherwise without complaint today.  The patient denies symptoms of fevers, chills, cough, or new SOB worrisome for COVID 19.  Past Medical History:  Diagnosis Date  . Adenocarcinoma of stomach ([place]) 11/11/2015   gastric mass on egd  . Anal fissure   . Anemia    hx 3/17 iron infusion, on aranesp and injected B12 since 11/2015.   [name] Anxiety   . Aortic root dilatation ([place])   . Arthritis    oa  . 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 CAD (coronary artery disease)   . Chronic kidney disease    only has one kidney rt lft rem 03 ca     dr. Risa Grill, bladder cancer 05-04-2018  . Elevated LFTs    hepatic steatosis, none recent  . Gastroparesis   . GERD (gastroesophageal reflux disease)   . History of uterine cancer 1980s   treated with hysterectomy, external radiation and radiation seed implants.   [name] HTN (hypertension)   . Hyperlipemia   . Hypothyroidism   . Iron deficiency anemia due to chronic blood loss 11/24/2015  . LVH (left ventricular hypertrophy) 11/14/2015   Mild, noted on ECHO  . Moderate aortic stenosis   . Neuropathy    right leg  . No blood products 02/27/2016  . Osteomyelitis ([place]) as child  . PAF (paroxysmal atrial fibrillation) ([place])   . PONV (postoperative nausea and vomiting)    long time ago  . Presence of permanent cardiac pacemaker   . Radiation proctitis   . Restless leg syndrome   . Stroke ([place]) 15   partial stroke left legally blind; left eye   . Tachycardia-bradycardia syndrome ([place])    s/p PPM by Dr Doreatha Lew (MDT) 07/03/10    Past Surgical History:  Procedure Laterality Date  . CARDIAC CATHETERIZATION  2008  . CHOLECYSTECTOMY  1970s  . COLONOSCOPY N/A 11/11/2015   Procedure: COLONOSCOPY;  Surgeon: [name], MD;  Location: [facility];  Service: Endoscopy;  Laterality: N/A;  . CYSTOSCOPY W/ RETROGRADES Right 05/01/2018   Procedure: CYSTOSCOPY WITH RIGHT RETROGRADE PYELOGRAM;  Surgeon: Lucas Mallow, MD;  Location: [facility];  Service: Urology;  Laterality: Right;  . CYSTOSCOPY W/ RETROGRADES Right 07/21/2018   Procedure: CYSTOSCOPY WITH RIGHT RETROGRADE PYELOGRAM;  Surgeon: Alexis Frock, MD;  [facility];  Service: Urology;  Laterality: Right;  . ESOPHAGOGASTRODUODENOSCOPY N/A 11/11/2015   Procedure: ESOPHAGOGASTRODUODENOSCOPY (EGD);  Surgeon: [name], MD;  Location[facility];  Service: Endoscopy;  Laterality: N/A;  . ESOPHAGOGASTRODUODENOSCOPY N/A 02/05/2016   Procedure: ESOPHAGOGASTRODUODENOSCOPY (EGD);  Surgeon: Ladene Artist, MD;  [facility];  Service: Endoscopy;  Laterality: N/A;  . ESOPHAGOGASTRODUODENOSCOPY N/A 04/02/2016   Procedure: ESOPHAGOGASTRODUODENOSCOPY (EGD);  Surgeon: [name], MD;  Location: [facility];  Service:  Endoscopy;  Laterality: N/A;  . ESOPHAGOGASTRODUODENOSCOPY (EGD) WITH PROPOFOL N/A 04/06/2017   Procedure: ESOPHAGOGASTRODUODENOSCOPY (EGD) WITH PROPOFOL;  Surgeon: [name], MD;  [facility];  Service: Endoscopy;  Laterality: N/A;  . GASTRECTOMY N/A 01/15/2016   Procedure: OPEN PARTIAL GASTRECTOMY;  Surgeon: Stark Klein, MD;  Location: [facility];  Service: General;  Laterality: N/A;  . GASTRECTOMY Left 02/24/2016   Procedure: OPEN J TUBE;  Surgeon: Stark Klein, MD;  Location: [facility];  Service: General;  Laterality: Left;  [name] GASTROJEJUNOSTOMY N/A 05/18/2016   Procedure: ROUX EN Y GASTROJEJUNOSTOMY;  Surgeon: Stark Klein, MD;  Location: [facility];  Service: General;  Laterality: N/A;  . I&D KNEE WITH POLY EXCHANGE Right 12/17/2013   Procedure: IRRIGATION AND DEBRIDEMEN RIGHT TOTAL  KNEE WITH POLY EXCHANGE;  Surgeon: Mauri Pole, MD;  [facility];  Service: Orthopedics;  Laterality: Right;  . IR GENERIC HISTORICAL  06/17/2016   IR RADIOLOGIST EVAL & MGMT 06/17/2016 Ascencion Dike, PA-C [facility] INTERV RAD  . LAPAROSCOPY N/A 01/15/2016   Procedure: LAPAROSCOPY DIAGNOSTIC;  Surgeon: Stark Klein, MD;  Location: [facility];  Service: General;  Laterality: N/A;  . LYSIS OF ADHESION  05/18/2016   Procedure: LYSIS OF ADHESION;  Surgeon: Stark Klein, MD;  Location: [facility];  Service: General;;  . NEPHRECTOMY Left    renal cell cancer  . PACEMAKER INSERTION  07/04/2011   MDT PPM implanted by Dr Doreatha Lew for tachy/brady; managed by Dr Thompson Grayer   . PATELLAR TENDON REPAIR Right 03/06/2014   Procedure: AVULSION WITH PATELLA TENDON REPAIR;  Surgeon: Mauri Pole, MD;  [facility];  Service: Orthopedics;  Laterality: Right;  . PATENT DUCTUS ARTERIOUS REPAIR  ~ 1949  . TOTAL ABDOMINAL HYSTERECTOMY  85  . TOTAL HIP REVISION Right 2009   initial replacment surg  . TOTAL KNEE ARTHROPLASTY  02/07/2012   Procedure: TOTAL KNEE ARTHROPLASTY;  Surgeon: Mauri Pole, MD;  [facility];  Service:  Orthopedics;  Laterality: Right;  . TOTAL KNEE REVISION Right 07/29/2014   Procedure: RIGHT KNEE REVISION OF PREVIOUS REPAIR EXTENSOR MECHANISM;  Surgeon: Mauri Pole, MD;  [facility];  Service: Orthopedics;  Laterality: Right;  . TRANSURETHRAL RESECTION OF BLADDER TUMOR N/A 07/21/2018   Procedure: TRANSURETHRAL RESECTION OF BLADDER TUMOR (TURBT);  Surgeon: Alexis Frock, MD;  [facility];  Service: Urology;  Laterality: N/A;  . TRANSURETHRAL RESECTION OF BLADDER TUMOR WITH MITOMYCIN-C N/A 05/01/2018   Procedure: TRANSURETHRAL RESECTION OF BLADDER TUMOR WITH POST OP INSTILLATION OF GEMCITABINE;  Surgeon: Lucas Mallow, MD;  [facility];  Service: Urology;  Laterality: N/A;  . WHIPPLE PROCEDURE N/A 05/18/2016   Procedure: EXPLORATORY LAPAROTOMY;  Surgeon: Stark Klein, MD;  Location: [facility];  Service: General;  Laterality: N/A;    Current Outpatient Medications  Medication Sig Dispense Refill  . acetaminophen (TYLENOL) 500 MG tablet Take 500-1,000 mg by mouth every 6 (six) hours as needed for mild pain  or headache.     . Biotin 10 MG CAPS Take 10 mg by mouth daily.     . cetirizine (ZYRTEC) 10 MG tablet Take 10 mg by mouth daily.    . Cholecalciferol (VITAMIN D-3) 5000 units TABS Take 5,000 Units by mouth daily.    [name] diltiazem (CARDIZEM CD) 180 MG 24 hr capsule Take 180 mg by mouth 2 (two) times a day.    . famotidine (PEPCID) 40 MG tablet Take 1 tablet (40 mg total) by mouth daily. 90 tablet 3  . furosemide (LASIX) 20 MG tablet Take 1 tablet by mouth daily.    [name] gabapentin (NEURONTIN) 300 MG capsule Take 1 capsule by mouth 2 (two) times a day.    . levothyroxine (SYNTHROID, LEVOTHROID) 100 MCG tablet TAKE 1/2 TABLET BY MOUTH DAILY BEFORE BREAKFAST. 45 tablet 1  . loperamide (IMODIUM) 2 MG capsule Take by mouth as needed for diarrhea or loose stools. Up to 16 day    . losartan (COZAAR) 25 MG tablet Take 2 tablets by mouth daily.    . metoprolol tartrate (LOPRESSOR) 100  MG tablet Take 0.5 tablets by mouth daily.    . nitroGLYCERIN (NITROSTAT) 0.4 MG SL tablet Place 1 tablet (0.4 mg total) under the tongue every 5 (five) minutes as needed for chest pain (MAX 3 TABLETS). 25 tablet prn  . ondansetron (ZOFRAN-ODT) 8 MG disintegrating tablet Take 8 mg by mouth every 8 (eight) hours as needed for nausea or vomiting.     . simethicone (MYLICON) 80 MG chewable tablet Chew 80 mg by mouth every 6 (six) hours as needed for flatulence.    . sucralfate (CARAFATE) 1 g tablet TAKE 1 TABLET (1 G TOTAL) BY MOUTH 4 (FOUR) TIMES DAILY - WITH MEALS AND AT BEDTIME. (Patient taking differently: Take 1 g by mouth 3 (three) times daily. ) 360 tablet 3  . warfarin (COUMADIN) 2 MG tablet TAKE 1 TABLET BY MOUTH DAILY OR AS DIRECTED 90 tablet 1   No current facility-administered medications for this visit.     Allergies:   Adhesive [tape]; Amiodarone; Hydrocodone; Remeron [mirtazapine]; Codeine; Morphine and related; Reglan [metoclopramide]; Requip [ropinirole hcl]; Zinc; and Other   Social History:  The patient  reports that she has never smoked. She has never used smokeless tobacco. She reports that she does not drink alcohol or use drugs.   Family History:  The patient's  family history includes Colon cancer (age of onset: 2) in her mother; Heart attack in her father; Heart disease in her father, maternal grandmother, and mother.   ROS:  Please see the history of present illness.   All other systems are personally reviewed and negative.    Exam:    Vital Signs:  BP 119/74   Pulse 76   Wt 191 lb 12.8 oz (87 kg)   BMI 38.74 kg/m   Elderly appearing, alert and conversant, regular work of breathing,  good skin color Eyes- anicteric, neuro- grossly intact, skin- no apparent rash or lesions or cyanosis, mouth- oral mucosa is pink   Labs/Other Tests and Data Reviewed:    Recent Labs: 10/30/2018: ALT 16; BUN 22; Creat 0.73; Hemoglobin 13.3; Magnesium 1.5; Platelets 259; Potassium  3.5; Sodium 142; TSH 1.07   Wt Readings from Last 3 Encounters:  01/03/19 191 lb 12.8 oz (87 kg)  12/20/18 97 lb 12.8 oz (44.4 kg)  10/30/18 102 lb 12.8 oz (46.6 kg)     Other studies personally reviewed: Additional studies/ records that  were reviewed today include: my prior office visits  Review of the above records today demonstrates: as above   Last device remote is reviewed from [facility] PDF dated 01/01/2019 which reveals normal device function, afib 9%   ASSESSMENT & PLAN:    1.  Paroxysmal afib Recently increased, likely due to recent prednisone Continue anticoagulation with coumadin for chads2vasc score of 7 She feels better with increased diltiazem Consider amiodarone with further worsening of afib  2. Sinus bradycardia remotes are uptodate Recent remote reveals normal device function  3. HTN Stable No change required today  4. Mitral stenosis (moderate) with pulmonary hypertension Per Dr [name] Hopefully SOB will improve as she remains in sinus Sodium restriction advised  5. COVID 19 screen The patient denies symptoms of COVID 19 at this time.  The importance of social distancing was discussed today.  The patient has recently worsening arrhythmias.  She is at risk for decompensation/ hospitalization.  A high level of decision making was required for this encounter. Follow-up:  AF clinic in 2 weeks Next remote: 02/2019  Current medicines are reviewed at length with the patient today.   The patient does not have concerns regarding her medicines.  The following changes were made today:  none  Labs/ tests ordered today include:  No orders of the defined types were placed in this encounter.   Patient Risk:  after full review of this patients clinical status, I feel that they are at moderate risk at this time.  Today, I have spent 18 minutes with the patient with telehealth technology discussing afib .    SignedThompson Grayer, MD  01/03/2019 3:11 PM     [address] [place] [address] [phone] (office) [phone] (fax)

## 2019-01-04 ENCOUNTER — Other Ambulatory Visit: Payer: Self-pay

## 2019-01-04 ENCOUNTER — Encounter (HOSPITAL_COMMUNITY): Payer: Self-pay

## 2019-01-04 MED ORDER — LOSARTAN POTASSIUM 25 MG PO TABS: 50.0000 mg | ORAL_TABLET | Freq: Every day | ORAL | 1 refills | Status: DC

## 2019-01-05 ENCOUNTER — Other Ambulatory Visit: Payer: Self-pay

## 2019-01-05 ENCOUNTER — Ambulatory Visit (INDEPENDENT_AMBULATORY_CARE_PROVIDER_SITE_OTHER): Payer: Medicare Other | Admitting: Physician Assistant

## 2019-01-05 ENCOUNTER — Telehealth: Payer: Self-pay | Admitting: Cardiology

## 2019-01-05 VITALS — BP 142/74 | HR 74 | Wt 102.0 lb

## 2019-01-05 DIAGNOSIS — I480 Paroxysmal atrial fibrillation: Secondary | ICD-10-CM

## 2019-01-05 DIAGNOSIS — I5033 Acute on chronic diastolic (congestive) heart failure: Secondary | ICD-10-CM

## 2019-01-05 DIAGNOSIS — Z5181 Encounter for therapeutic drug level monitoring: Secondary | ICD-10-CM

## 2019-01-05 DIAGNOSIS — I10 Essential (primary) hypertension: Secondary | ICD-10-CM

## 2019-01-05 DIAGNOSIS — I495 Sick sinus syndrome: Secondary | ICD-10-CM

## 2019-01-05 DIAGNOSIS — I5032 Chronic diastolic (congestive) heart failure: Secondary | ICD-10-CM | POA: Insufficient documentation

## 2019-01-05 MED ORDER — FUROSEMIDE 20 MG PO TABS: 20.0000 mg | ORAL_TABLET | Freq: Every day | ORAL | 3 refills | Status: DC

## 2019-01-05 NOTE — Telephone Encounter (Signed)
New Message:    patient daughter calling concerning her mothers medication. Please call patient daughter.

## 2019-01-05 NOTE — Progress Notes (Signed)
BMET and BNP ordered for [facility].[name] [facility] to draw 01/06/19.

## 2019-01-05 NOTE — Telephone Encounter (Signed)
[place] Visit Request  Agency Requested:  [facility] Contact:  Joen Laura, RN Phone #: [phone] Fax #: [phone]  Patient Information: Name:  [name]  DOB:  12-Apr-1934  MRN:  [id number]  Address:   [address] [place] [address] [place] [address]  Phone Numbers:   Home Phone [phone]  Mobile [phone]     Requesting Provider:   Dr. Adella Hare Duke PA  Diagnosis:   CHF  Reason for Visit:   CHF assessment and labs 01/06/19... BMET, BNP   Services Requested:  Vital Signs (BP, Pulse, O2, Weight)  # of Visits Needed/Frequency per Week: 1 for now

## 2019-01-05 NOTE — Patient Instructions (Addendum)
Take 20 mg lasix BID x 3 days.

## 2019-01-05 NOTE — Telephone Encounter (Addendum)
Pts daughter, [name] on [place]. called to report that she is extremely anxious about the pt.. She has been on prednisone recently and ended the course of it last week so she acknowledges that the the pt has had an increased appetite.. but her weight is normally around 94-99 lbs. Her weight is staying at 103 all week even though she has had the Lasix 20mg  Wed, Thurs, and this morning... she usually only takes it prn with a  4lb weight gain... she she has h/o dehydration.. she also does not take K due to h/o hyperkalemia which was managed in the past by Dr. Melford Aase. But she has the 10 meq on hand if needed.   She feels the fluid overload is worsening and not improving... she is still sob, still has bilateral peripheral edema and last night had to sleep with the head of the bed elevated.  She just feels the pt is not okay and very worried about an ER visit in the nest several days.   She is asking for a CXR or bloodwork possibly to assess.  I advised her that I will forward to the APP for review and recommendation.   Her BP today 142/95.[name] HR 74.. pt had video visit with Dr. Rayann Heman 01/03/19.    (f needs labwork can check with [facility] to draw at home since they have been helping [place] with our CHF patients. If appropriate.)

## 2019-01-05 NOTE — Progress Notes (Signed)
Virtual Visit via Telephone Note   This visit type was conducted due to national recommendations for restrictions regarding the COVID-19 Pandemic (e.g. social distancing) in an effort to limit this patient's exposure and mitigate transmission in our community.  Due to her co-morbid illnesses, this patient is at least at moderate risk for complications without adequate follow up.  This format is felt to be most appropriate for this patient at this time.  The patient did not have access to video technology/had technical difficulties with video requiring transitioning to audio format only (telephone).  All issues noted in this document were discussed and addressed.  No physical exam could be performed with this format.  Please refer to the patient's chart for her  consent to telehealth for [employer]   Date:  01/05/2019   ID:  [name], DOB 04-21-1934, MRN [id number]  Patient Location: Home Provider Location: Home  PCP:  [name], MD  Cardiologist:  [name], MD  Electrophysiologist:  None   Evaluation Performed:  Follow-Up Visit  Chief Complaint:  Shortness of breath, orthopnea, lower extremity swelling  History of Present Illness:    Message received from Triage. Given the patient's overall clinical picture and complaints, I obtained permission for a PHONE telehealth visit.   [name] is a 85 y.o. female with a history of atrial fibrillation, tachy-brady syndrome with PPM in place (07/01/10), patent ductus areriosus with repair, moderate AS and chronic diastolic heart failure with preserved EF as of last echo in 2017. She recently saw Dr. Rayann Heman via telehealth visit 01/03/19 and was having issues with palpitations and SOB. She increased her diltiazem and was feeling better at thet visit. She has been on a course of prednisone for allergies/cold. The prednisone coincided with her increased fluid and palpitations. She is anticoagulated with coumadin for a  CHA2DS2-VASc Score and unadjusted Ischemic Stroke Rate (% per year) is equal to 11.2 % stroke rate/year from a score of 66 (age, female, HTN, CHF, stroke)  She contacted our office today with an increase in weight, not helped by 20 mg lasix, orthopnea, dyspnea on exertion, and lower extremity edema. She has taken 20 mg lasix on Wed, Thurs, and Fri.  Thurs morning her weight was increased to 102 lbs, weight is still 102 lb this morning. BP has been 142/95, HR 74, after taking steps 162/94, HR 88.   The patient's daughter and caregiver, [name], is very concerned about taking her out of the house to the ER or to a clinic visit.   The patient does not have symptoms concerning for COVID-19 infection (fever, chills, cough, or new shortness of breath).    Past Medical History:  Diagnosis Date  . Adenocarcinoma of stomach ([place]) 11/11/2015   gastric mass on egd  . Anal fissure   . Anemia    hx 3/17 iron infusion, on aranesp and injected B12 since 11/2015.   [name] Anxiety   . Aortic root dilatation ([place])   . Arthritis    oa  . 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 CAD (coronary artery disease)   . Chronic kidney disease    only has one kidney rt lft rem 03 ca     dr. Risa Grill, bladder cancer 05-04-2018  . Elevated LFTs    hepatic steatosis, none recent  . Gastroparesis   . GERD (gastroesophageal reflux disease)   . History of uterine cancer 1980s   treated with hysterectomy, external radiation and radiation seed implants.   [name]  HTN (hypertension)   . Hyperlipemia   . Hypothyroidism   . Iron deficiency anemia due to chronic blood loss 11/24/2015  . LVH (left ventricular hypertrophy) 11/14/2015   Mild, noted on ECHO  . Moderate aortic stenosis   . Neuropathy    right leg  . No blood products 02/27/2016  . Osteomyelitis ([place]) as child  . PAF (paroxysmal atrial fibrillation) ([place])   . PONV (postoperative nausea and vomiting)    long time ago  . Presence of permanent cardiac pacemaker   . Radiation proctitis    . Restless leg syndrome   . Stroke ([place]) 15   partial stroke left legally blind; left eye   . Tachycardia-bradycardia syndrome ([place])    s/p PPM by Dr Doreatha Lew (MDT) 07/03/10   Past Surgical History:  Procedure Laterality Date  . CARDIAC CATHETERIZATION  2008  . CHOLECYSTECTOMY  1970s  . COLONOSCOPY N/A 11/11/2015   Procedure: COLONOSCOPY;  Surgeon: [name], MD;  [facility];  Service: Endoscopy;  Laterality: N/A;  . CYSTOSCOPY W/ RETROGRADES Right 05/01/2018   Procedure: CYSTOSCOPY WITH RIGHT RETROGRADE PYELOGRAM;  Surgeon: Lucas Mallow, MD;  [facility];  Service: Urology;  Laterality: Right;  . CYSTOSCOPY W/ RETROGRADES Right 07/21/2018   Procedure: CYSTOSCOPY WITH RIGHT RETROGRADE PYELOGRAM;  Surgeon: Alexis Frock, MD;  [facility];  Service: Urology;  Laterality: Right;  . ESOPHAGOGASTRODUODENOSCOPY N/A 11/11/2015   Procedure: ESOPHAGOGASTRODUODENOSCOPY (EGD);  Surgeon: [name], MD;  Location: [facility];  Service: Endoscopy;  Laterality: N/A;  . ESOPHAGOGASTRODUODENOSCOPY N/A 02/05/2016   Procedure: ESOPHAGOGASTRODUODENOSCOPY (EGD);  Surgeon: Ladene Artist, MD;  Location[facility];  Service: Endoscopy;  Laterality: N/A;  . ESOPHAGOGASTRODUODENOSCOPY N/A 04/02/2016   Procedure: ESOPHAGOGASTRODUODENOSCOPY (EGD);  Surgeon: [name], MD;  Location: [facility];  Service: Endoscopy;  Laterality: N/A;  . ESOPHAGOGASTRODUODENOSCOPY (EGD) WITH PROPOFOL N/A 04/06/2017   Procedure: ESOPHAGOGASTRODUODENOSCOPY (EGD) WITH PROPOFOL;  Surgeon: [name], MD;  [facility];  Service: Endoscopy;  Laterality: N/A;  . GASTRECTOMY N/A 01/15/2016   Procedure: OPEN PARTIAL GASTRECTOMY;  Surgeon: Stark Klein, MD;  Location: [place];  Service: General;  Laterality: N/A;  . GASTRECTOMY Left 02/24/2016   Procedure: OPEN J TUBE;  Surgeon: Stark Klein, MD;  Location: [place];  Service: General;  Laterality: Left;  [name] GASTROJEJUNOSTOMY N/A 05/18/2016    Procedure: ROUX EN Y GASTROJEJUNOSTOMY;  Surgeon: Stark Klein, MD;  Location: [place];  Service: General;  Laterality: N/A;  . I&D KNEE WITH POLY EXCHANGE Right 12/17/2013   Procedure: IRRIGATION AND DEBRIDEMEN RIGHT TOTAL  KNEE WITH POLY EXCHANGE;  Surgeon: Mauri Pole, MD;  [facility];  Service: Orthopedics;  Laterality: Right;  . IR GENERIC HISTORICAL  06/17/2016   IR RADIOLOGIST EVAL & MGMT 06/17/2016 Ascencion Dike, PA-C [facility] INTERV RAD  . LAPAROSCOPY N/A 01/15/2016   Procedure: LAPAROSCOPY DIAGNOSTIC;  Surgeon: Stark Klein, MD;  Location: [place];  Service: General;  Laterality: N/A;  . LYSIS OF ADHESION  05/18/2016   Procedure: LYSIS OF ADHESION;  Surgeon: Stark Klein, MD;  Location: [place];  Service: General;;  . NEPHRECTOMY Left    renal cell cancer  . PACEMAKER INSERTION  07/04/2011   MDT PPM implanted by Dr Doreatha Lew for tachy/brady; managed by Dr Thompson Grayer   . PATELLAR TENDON REPAIR Right 03/06/2014   Procedure: AVULSION WITH PATELLA TENDON REPAIR;  Surgeon: Mauri Pole, MD;  [facility];  Service: Orthopedics;  Laterality: Right;  .  PATENT DUCTUS ARTERIOUS REPAIR  ~ 1949  . TOTAL ABDOMINAL HYSTERECTOMY  85  . TOTAL HIP REVISION Right 2009   initial replacment surg  . TOTAL KNEE ARTHROPLASTY  02/07/2012   Procedure: TOTAL KNEE ARTHROPLASTY;  Surgeon: Mauri Pole, MD;  [facility];  Service: Orthopedics;  Laterality: Right;  . TOTAL KNEE REVISION Right 07/29/2014   Procedure: RIGHT KNEE REVISION OF PREVIOUS REPAIR EXTENSOR MECHANISM;  Surgeon: Mauri Pole, MD;  [facility];  Service: Orthopedics;  Laterality: Right;  . TRANSURETHRAL RESECTION OF BLADDER TUMOR N/A 07/21/2018   Procedure: TRANSURETHRAL RESECTION OF BLADDER TUMOR (TURBT);  Surgeon: Alexis Frock, MD;  [facility];  Service: Urology;  Laterality: N/A;  . TRANSURETHRAL RESECTION OF BLADDER TUMOR WITH MITOMYCIN-C N/A 05/01/2018   Procedure: TRANSURETHRAL RESECTION OF BLADDER TUMOR WITH  POST OP INSTILLATION OF GEMCITABINE;  Surgeon: Lucas Mallow, MD;  [facility];  Service: Urology;  Laterality: N/A;  . WHIPPLE PROCEDURE N/A 05/18/2016   Procedure: EXPLORATORY LAPAROTOMY;  Surgeon: Stark Klein, MD;  Location: [place];  Service: General;  Laterality: N/A;     Current Meds  Medication Sig  . acetaminophen (TYLENOL) 500 MG tablet Take 500-1,000 mg by mouth every 6 (six) hours as needed for mild pain or headache.   . cetirizine (ZYRTEC) 10 MG tablet Take 10 mg by mouth daily.  [name] diltiazem (CARDIZEM CD) 180 MG 24 hr capsule Take 180 mg by mouth 2 (two) times a day.  . famotidine (PEPCID) 40 MG tablet Take 1 tablet (40 mg total) by mouth daily.  . furosemide (LASIX) 20 MG tablet Take 1 tablet by mouth daily.  [name] gabapentin (NEURONTIN) 300 MG capsule Take 1 capsule by mouth 2 (two) times a day.  . levothyroxine (SYNTHROID, LEVOTHROID) 100 MCG tablet TAKE 1/2 TABLET BY MOUTH DAILY BEFORE BREAKFAST.  [name] loperamide (IMODIUM) 2 MG capsule Take by mouth as needed for diarrhea or loose stools. Up to 16 day  . losartan (COZAAR) 25 MG tablet Take 2 tablets (50 mg total) by mouth daily.  . metoprolol tartrate (LOPRESSOR) 100 MG tablet Take 0.5 tablets by mouth daily.  . nitroGLYCERIN (NITROSTAT) 0.4 MG SL tablet Place 1 tablet (0.4 mg total) under the tongue every 5 (five) minutes as needed for chest pain (MAX 3 TABLETS).  . simethicone (MYLICON) 80 MG chewable tablet Chew 80 mg by mouth every 6 (six) hours as needed for flatulence.  . sucralfate (CARAFATE) 1 g tablet TAKE 1 TABLET (1 G TOTAL) BY MOUTH 4 (FOUR) TIMES DAILY - WITH MEALS AND AT BEDTIME. (Patient taking differently: Take 1 g by mouth 3 (three) times daily. )  . warfarin (COUMADIN) 2 MG tablet TAKE 1 TABLET BY MOUTH DAILY OR AS DIRECTED     Allergies:   Adhesive [tape]; Amiodarone; Hydrocodone; Remeron [mirtazapine]; Codeine; Morphine and related; Reglan [metoclopramide]; Requip [ropinirole hcl]; Zinc; and Other    Social History   Tobacco Use  . Smoking status: Never Smoker  . Smokeless tobacco: Never Used  Substance Use Topics  . Alcohol use: No  . Drug use: No     Family Hx: The patient's family history includes Colon cancer (age of onset: 61) in her mother; Heart attack in her father; Heart disease in her father, maternal grandmother, and mother.  ROS:   Please see the history of present illness.     All other systems reviewed and are negative.   Prior CV studies:   The following studies  were reviewed today:  Echo 11/14/15: Study Conclusions  - Left ventricle: The cavity size was normal. There was mild   concentric hypertrophy. Systolic function was normal. The   estimated ejection fraction was in the range of 60% to 65%. Wall   motion was normal; there were no regional wall motion   abnormalities. The study was not technically sufficient to allow   evaluation of LV diastolic dysfunction due to atrial   fibrillation. - Aortic valve: Valve mobility was restricted. There was mild to   moderate stenosis. There was moderate regurgitation. Peak   gradient (S): 38 mm Hg. Valve area (VTI): 0.55 cm^2. Valve area   (Vmax): 0.57 cm^2. Valve area (Vmean): 0.59 cm^2. - Aortic root: The aortic root was normal in size. - Ascending aorta: The ascending aorta was normal in size. - Mitral valve: Calcified annulus. Moderately thickened, severely   calcified leaflets . Mobility was restricted. The findings are   consistent with moderate stenosis. There was mild regurgitation.   Valve area by continuity equation (using LVOT flow): 0.82 cm^2. - Left atrium: The atrium was mildly dilated. - Right ventricle: The cavity size was mildly dilated. Wall   thickness was normal. Systolic function was mildly reduced. - Right atrium: The atrium was mildly dilated. - Tricuspid valve: There was moderate regurgitation. - Pulmonary arteries: Systolic pressure was moderately to severely   increased. PA peak  pressure: 55 mm Hg (S). - Inferior vena cava: The vessel was dilated. The respirophasic   diameter changes were blunted (< 50%), consistent with elevated   central venous pressure. - Pericardium, extracardiac: There was no pericardial effusion.  Labs/Other Tests and Data Reviewed:    EKG:  No ECG reviewed.  Recent Labs: 10/30/2018: ALT 16; BUN 22; Creat 0.73; Hemoglobin 13.3; Magnesium 1.5; Platelets 259; Potassium 3.5; Sodium 142; TSH 1.07   Recent Lipid Panel Lab Results  Component Value Date/Time   CHOL 143 10/30/2018 11:45 AM   TRIG 143 10/30/2018 11:45 AM   HDL 43 (L) 10/30/2018 11:45 AM   CHOLHDL 3.3 10/30/2018 11:45 AM   LDLCALC 76 10/30/2018 11:45 AM    Wt Readings from Last 3 Encounters:  01/05/19 102 lb (46.3 kg)  01/03/19 191 lb 12.8 oz (87 kg)  12/20/18 97 lb 12.8 oz (44.4 kg)     Objective:    Vital Signs:  BP (!) 142/74   Pulse 74   Wt 102 lb (46.3 kg)   BMI 20.60 kg/m    VITAL SIGNS:  reviewed GEN:  no acute distress RESPIRATORY:  normal respiratory effort, symmetric expansion NEURO:  alert and oriented x 3, no obvious focal deficit PSYCH:  normal affect  ASSESSMENT & PLAN:    1. Acute on chronic diastolic heart failure 2, paroxysmal atrial fibrillation 3. Tachy-brady syndrome, s/p PPM 4. HTN 5. Hx of hyperkalemia - CHF exacerbation is likely related to her recent prednisone use combined with Afib.  - she has been taking 20 mg lasix daily for 2 days - I recommended increasing to 20 mg lasix BID x 3 days, starting today - I recommended 10 mEq potassium daily, since she has a solitary kidney and a history of hyperkalemia - I will make a referral to home health to visit the home for an assessment and to draw BMP, BNP on Saturday  We reviewed when to call the office back and when to go to the ER.  I will request a repeat telehealth VIDEO visit next Monday or Tues with Dr.  [name] or an APP on his team.   COVID-19 Education: The signs and symptoms  of COVID-19 were discussed with the patient and how to seek care for testing (follow up with PCP or arrange E-visit).  The importance of social distancing was discussed today.  Time:   Today, I have spent 20 minutes with the patient with telehealth technology discussing the above problems.     Medication Adjustments/Labs and Tests Ordered: Current medicines are reviewed at length with the patient today.  Concerns regarding medicines are outlined above.    Tests Ordered: No orders of the defined types were placed in this encounter.   Medication Changes: Meds ordered this encounter  Medications  . furosemide (LASIX) 20 MG tablet    Sig: Take 1 tablet (20 mg total) by mouth daily.    Dispense:  60 tablet    Refill:  3    Disposition:  Follow up in 3 day(s)  Signed, Ledora Bottcher, PA  01/05/2019 12:37 PM    [facility]

## 2019-01-06 DIAGNOSIS — I050 Rheumatic mitral stenosis: Secondary | ICD-10-CM | POA: Diagnosis not present

## 2019-01-06 DIAGNOSIS — G2581 Restless legs syndrome: Secondary | ICD-10-CM | POA: Diagnosis not present

## 2019-01-06 DIAGNOSIS — N189 Chronic kidney disease, unspecified: Secondary | ICD-10-CM | POA: Diagnosis not present

## 2019-01-06 DIAGNOSIS — Z905 Acquired absence of kidney: Secondary | ICD-10-CM | POA: Diagnosis not present

## 2019-01-06 DIAGNOSIS — Z8551 Personal history of malignant neoplasm of bladder: Secondary | ICD-10-CM | POA: Diagnosis not present

## 2019-01-06 DIAGNOSIS — Z8673 Personal history of transient ischemic attack (TIA), and cerebral infarction without residual deficits: Secondary | ICD-10-CM | POA: Diagnosis not present

## 2019-01-06 DIAGNOSIS — I272 Pulmonary hypertension, unspecified: Secondary | ICD-10-CM | POA: Diagnosis not present

## 2019-01-06 DIAGNOSIS — K3184 Gastroparesis: Secondary | ICD-10-CM | POA: Diagnosis not present

## 2019-01-06 DIAGNOSIS — I480 Paroxysmal atrial fibrillation: Secondary | ICD-10-CM | POA: Diagnosis not present

## 2019-01-06 DIAGNOSIS — I5033 Acute on chronic diastolic (congestive) heart failure: Secondary | ICD-10-CM | POA: Diagnosis not present

## 2019-01-06 DIAGNOSIS — Z96651 Presence of right artificial knee joint: Secondary | ICD-10-CM | POA: Diagnosis not present

## 2019-01-06 DIAGNOSIS — I130 Hypertensive heart and chronic kidney disease with heart failure and stage 1 through stage 4 chronic kidney disease, or unspecified chronic kidney disease: Secondary | ICD-10-CM | POA: Diagnosis not present

## 2019-01-06 DIAGNOSIS — Z85028 Personal history of other malignant neoplasm of stomach: Secondary | ICD-10-CM | POA: Diagnosis not present

## 2019-01-06 DIAGNOSIS — I2510 Atherosclerotic heart disease of native coronary artery without angina pectoris: Secondary | ICD-10-CM | POA: Diagnosis not present

## 2019-01-06 DIAGNOSIS — Z7901 Long term (current) use of anticoagulants: Secondary | ICD-10-CM | POA: Diagnosis not present

## 2019-01-06 DIAGNOSIS — Z950 Presence of cardiac pacemaker: Secondary | ICD-10-CM | POA: Diagnosis not present

## 2019-01-06 DIAGNOSIS — G5791 Unspecified mononeuropathy of right lower limb: Secondary | ICD-10-CM | POA: Diagnosis not present

## 2019-01-06 DIAGNOSIS — Z8553 Personal history of malignant neoplasm of renal pelvis: Secondary | ICD-10-CM | POA: Diagnosis not present

## 2019-01-06 DIAGNOSIS — E039 Hypothyroidism, unspecified: Secondary | ICD-10-CM | POA: Diagnosis not present

## 2019-01-06 DIAGNOSIS — H6523 Chronic serous otitis media, bilateral: Secondary | ICD-10-CM | POA: Diagnosis not present

## 2019-01-06 DIAGNOSIS — K219 Gastro-esophageal reflux disease without esophagitis: Secondary | ICD-10-CM | POA: Diagnosis not present

## 2019-01-06 DIAGNOSIS — E8770 Fluid overload, unspecified: Secondary | ICD-10-CM | POA: Diagnosis not present

## 2019-01-08 ENCOUNTER — Telehealth: Payer: Self-pay | Admitting: Cardiology

## 2019-01-08 NOTE — Telephone Encounter (Signed)
Spoke with he pt daughter [name]. Pt will resume Furosemide 20mg  daily. Her breathing has improved. The [facility] nurse was out to the pt home on Sat 01/06/19. Labs were drawn and the results will be faxed to our office. HH will be going out to the pt home twice a week for 3 weeks. [name] sts that they have not been told what days the nurse will be coming out. I asked [name] to have the [facility] nurse contact the office to provide an update of her assessment after the next visit.  Attempted to schedule a f/u telemedicine visit this week. Angie does not have any availability. Adv her that Dr.Jordan may have a possible slot Fri morning. I will fwd the message to Dr. Doug Sou nurse to contact her to schedule. [name] agreeable with the plan and voiced appreciation for the call back.

## 2019-01-08 NOTE — Telephone Encounter (Signed)
Spoke to patient's daughter [name] she stated she has several questions.Stated she is not sure how to give her mother lasix.Stated she wanted to make sure she does not give her too much lasix Virtual visit scheduled with Jory Sims DNP 01/09/19 at 8:00 am.

## 2019-01-08 NOTE — Telephone Encounter (Signed)
New Message   Pt c/o medication issue:  1. Name of Medication: Furosemide  2. How are you currently taking this medication (dosage and times per day)? 20 mg twice a day  3. Are you having a reaction (difficulty breathing--STAT)? No  4. What is your medication issue? Patient wants to know if the patient should decreased the medication since she's down to 98lbs .

## 2019-01-08 NOTE — Telephone Encounter (Signed)
New message    Cloyde Reams from [facility] is  Needing verbal orders to see patient for 2x a week for 3 weeks     Please call

## 2019-01-08 NOTE — Telephone Encounter (Signed)
Yes, if she is breathing better and her weight is under 100 lbs, I recommend going back to 20 mg lasix daily.  Home health assessment will be helpful in this situation.  Please set them up for another telehealth visit this week.  Can you please obtain labs from the weekend?

## 2019-01-08 NOTE — Telephone Encounter (Signed)
Verbal order given  For twice a week visit for 3 weeks -   This following phone order from 01/05/19

## 2019-01-09 ENCOUNTER — Encounter: Payer: Self-pay | Admitting: Adult Health

## 2019-01-09 ENCOUNTER — Telehealth: Payer: Self-pay

## 2019-01-09 ENCOUNTER — Ambulatory Visit (INDEPENDENT_AMBULATORY_CARE_PROVIDER_SITE_OTHER): Payer: Medicare Other | Admitting: Adult Health

## 2019-01-09 ENCOUNTER — Telehealth: Payer: Self-pay | Admitting: Adult Health

## 2019-01-09 VITALS — BP 118/78 | HR 84 | Wt 98.0 lb

## 2019-01-09 DIAGNOSIS — I5033 Acute on chronic diastolic (congestive) heart failure: Secondary | ICD-10-CM

## 2019-01-09 DIAGNOSIS — I4821 Permanent atrial fibrillation: Secondary | ICD-10-CM | POA: Diagnosis not present

## 2019-01-09 DIAGNOSIS — I10 Essential (primary) hypertension: Secondary | ICD-10-CM

## 2019-01-09 DIAGNOSIS — Z79899 Other long term (current) drug therapy: Secondary | ICD-10-CM

## 2019-01-09 MED ORDER — METOPROLOL TARTRATE 100 MG PO TABS: 50.0000 mg | ORAL_TABLET | Freq: Two times a day (BID) | ORAL | 6 refills | Status: DC

## 2019-01-09 MED ORDER — FUROSEMIDE 20 MG PO TABS: 20.0000 mg | ORAL_TABLET | Freq: Every day | ORAL | 3 refills | Status: DC | PRN

## 2019-01-09 NOTE — Telephone Encounter (Signed)
Found results at [facility] they will fax

## 2019-01-09 NOTE — Telephone Encounter (Signed)
New Message ° ° ° °Pt is returning a phone call  ° ° °Please call back  °

## 2019-01-09 NOTE — Telephone Encounter (Signed)
Called patient and spoke with daughter- she advised that she had missed a call from [place], I advised of instructions, and she wanted to make sure she did not need anything else.  I will route message to Sharyn Lull to make sure.  Thanks!

## 2019-01-09 NOTE — Telephone Encounter (Signed)
[facility] again and LMVM (this will be #2) to call and discuss lab results. S/w daughter [name] she states that the blood was drawn by nurse Cloyde Reams 5-2@330pm  and taken to [facility]. The phone number she gave to pt/family [phone] Spoke with [facility] office she found lab results and will fax to medical records to my attention. Contacted Caren Griffins in our medical records and she will scan as soon as she receives them. Brenda/daughter notified. Will await result notes

## 2019-01-09 NOTE — Telephone Encounter (Signed)
That is good still waiting for lab results

## 2019-01-09 NOTE — Telephone Encounter (Signed)
[place] Visit Request  Agency Requested:  [facility] Contact:  Darnell Level, NP Phone #:  [phone] Fax #:  [phone]  Patient Information: Name:  [name]  DOB:  May 02, 1934  MRN:  [id number]  Address:   [address] [place] [address]  Phone Numbers:   Home Phone [phone]  Mobile [phone]     Requesting Provider:   Jory Sims, NP  Diagnosis:   MEDICATION MANAGEMENT  Reason for Visit:   LAB DRAW-PT HAS HOME VISITS ORDERED PLEASE DO LAB DRAW IN ONE WEEK 01-16-2019  Services Requested:  Labs:  BMET  # of Visits Needed/Frequency per Week: ALREADY ORDERED 2X qWEEK   Notes recorded by Lendon Colonel, NP on 01/09/2019 at 3:07 PM EDT Potassium is low at 3.2. She will need to be put on 20 mEq of potassium each time she takes a lasix tablet (20 mg). She should take one potassium now whether she has taken any more lasix or not I asked her not to take any more lasix unless prn. She had been taking it daily and is now a little dehydrated. Follow up BMET with [facility] in one week.

## 2019-01-09 NOTE — Progress Notes (Signed)
Virtual Visit via Telephone Note   This visit type was conducted due to national recommendations for restrictions regarding the COVID-19 Pandemic (e.g. social distancing) in an effort to limit this patient's exposure and mitigate transmission in our community.  Due to her co-morbid illnesses, this patient is at least at moderate risk for complications without adequate follow up.  This format is felt to be most appropriate for this patient at this time.  The patient did not have access to video technology/had technical difficulties with video requiring transitioning to audio format only (telephone).  All issues noted in this document were discussed and addressed.  No physical exam could be performed with this format.  Please refer to the patient's chart for her  consent to telehealth for [facility].   Patient has given verbal permission to conduct this visit via virtual appointment and to bill insurance 01/09/2019 8:37 AM     Date:  01/09/2019   ID:  [name], DOB 14-Apr-1934, MRN [id number]  Patient Location: Home Provider Location: Home  PCP:  [name], MD  Cardiologist:  [name], MD  Electrophysiologist:  None   Evaluation Performed:  Follow-Up Visit  Chief Complaint:  Diastolic CHF   History of Present Illness:    ASRA GAMBREL is a 85 y.o. female we are following for ongoing assessment and management of atrial fibrillation, tachybradycardia syndrome with history of pacemaker insertion on 07/01/2010, PDA with repair, moderate aortic valve stenosis with chronic diastolic heart failure, preserved EF per last echo in 2017.  Other history includes CVA, hypertension.  The patient is also followed by Dr. Rayann Heman for pacemaker interrogation and at that time when seen on 01/03/2019 she was having issues with palpitations and shortness of breath.  Diltiazem dose was increased and she had resolution of symptoms.  She was continued on Coumadin therapy for CHADS VASC Score of  7.  Last encounter was with Fabian Sharp, PA, on 01/05/2019 at which time she had increased weight orthopnea dyspnea on exertion and lower extremity edema.  She was taking Lasix 20 mg on Wednesdays Thursdays and Fridays.  She was increased to 20 mg of Lasix twice daily for 3 days, with an additional 10 mEq of potassium daily.  It was noted that she has a solitary kidney with a history of hyperkalemia.  Home health was to be contacted for an assessment and to draw a BNP and a BMET.  The patient was to have a follow-up visit with Dr. [name].  The patient's daughter [name] had several questions concerning Lasix dosing, and requested an additional visit for clarification.  She was added to my schedule today to do so.  The patient did have some lab work drawn 3 days ago but are not available to review at this time.  The plan was to keep her weight under 100 pounds, if this was maintained she could go back to 20 mg daily.  However the patient was on it only 3 days a week.  In speaking with her daughter today who is her main caretaker she states that the patient is weak.  Her weight is back down to approximately 97 pounds.  She states that her mom lives downstairs in a 3 story home and when she climbs the stairs to the main level she is very short of breath and her heart rate is very elevated.  She states that after she rests things settle out.  But she remains weak and tired.  Sleeping a lot  more.  She is concerned that she is getting too much Lasix.  The patient has been getting 20 mg daily.  As result of this she feels that this is the cause of her weakness.  The patient is drinking and eating.  She denies chest pain palpitations or near syncope.  Lower extremity edema is better.   The patient does not have symptoms concerning for COVID-19 infection (fever, chills, cough, or new shortness of breath).    Past Medical History:  Diagnosis Date   Adenocarcinoma of stomach ([place]) 11/11/2015   gastric mass on egd     Anal fissure    Anemia    hx 3/17 iron infusion, on aranesp and injected B12 since 11/2015.    Anxiety    Aortic root dilatation (HCC)    Arthritis    oa   Bright disease as child   CAD (coronary artery disease)    Chronic kidney disease    only has one kidney rt lft rem 03 ca     dr. Risa Grill, bladder cancer 05-04-2018   Elevated LFTs    hepatic steatosis, none recent   Gastroparesis    GERD (gastroesophageal reflux disease)    History of uterine cancer 1980s   treated with hysterectomy, external radiation and radiation seed implants.    HTN (hypertension)    Hyperlipemia    Hypothyroidism    Iron deficiency anemia due to chronic blood loss 11/24/2015   LVH (left ventricular hypertrophy) 11/14/2015   Mild, noted on ECHO   Moderate aortic stenosis    Neuropathy    right leg   No blood products 02/27/2016   Osteomyelitis ([place]) as child   PAF (paroxysmal atrial fibrillation) (HCC)    PONV (postoperative nausea and vomiting)    long time ago   Presence of permanent cardiac pacemaker    Radiation proctitis    Restless leg syndrome    Stroke ([place]) 15   partial stroke left legally blind; left eye    Tachycardia-bradycardia syndrome ([place])    s/p PPM by Dr Doreatha Lew (MDT) 07/03/10   Past Surgical History:  Procedure Laterality Date   CARDIAC CATHETERIZATION  2008   CHOLECYSTECTOMY  1970s   COLONOSCOPY N/A 11/11/2015   Procedure: COLONOSCOPY;  Surgeon: [name], MD;  Location: [facility];  Service: Endoscopy;  Laterality: N/A;   CYSTOSCOPY W/ RETROGRADES Right 05/01/2018   Procedure: CYSTOSCOPY WITH RIGHT RETROGRADE PYELOGRAM;  Surgeon: Lucas Mallow, MD;  Location: [facility];  Service: Urology;  Laterality: Right;   CYSTOSCOPY W/ RETROGRADES Right 07/21/2018   Procedure: CYSTOSCOPY WITH RIGHT RETROGRADE PYELOGRAM;  Surgeon: Alexis Frock, MD;  Location: [facility];  Service: Urology;  Laterality: Right;   ESOPHAGOGASTRODUODENOSCOPY N/A  11/11/2015   Procedure: ESOPHAGOGASTRODUODENOSCOPY (EGD);  Surgeon: [name], MD;  Location: [facility];  Service: Endoscopy;  Laterality: N/A;   ESOPHAGOGASTRODUODENOSCOPY N/A 02/05/2016   Procedure: ESOPHAGOGASTRODUODENOSCOPY (EGD);  Surgeon: Ladene Artist, MD;  [facility];  Service: Endoscopy;  Laterality: N/A;   ESOPHAGOGASTRODUODENOSCOPY N/A 04/02/2016   Procedure: ESOPHAGOGASTRODUODENOSCOPY (EGD);  Surgeon: [name], MD;  Location: [facility];  Service: Endoscopy;  Laterality: N/A;   ESOPHAGOGASTRODUODENOSCOPY (EGD) WITH PROPOFOL N/A 04/06/2017   Procedure: ESOPHAGOGASTRODUODENOSCOPY (EGD) WITH PROPOFOL;  Surgeon: [name], MD;  [facility];  Service: Endoscopy;  Laterality: N/A;   GASTRECTOMY N/A 01/15/2016   Procedure: OPEN PARTIAL GASTRECTOMY;  Surgeon: Stark Klein, MD;  Location: [place];  Service: General;  Laterality: N/A;  GASTRECTOMY Left 02/24/2016   Procedure: OPEN J TUBE;  Surgeon: Stark Klein, MD;  Location: [place];  Service: General;  Laterality: Left;   GASTROJEJUNOSTOMY N/A 05/18/2016   Procedure: ROUX EN [place];  Surgeon: Stark Klein, MD;  Location: [place];  Service: General;  Laterality: N/A;   I&D KNEE WITH POLY EXCHANGE Right 12/17/2013   Procedure: IRRIGATION AND DEBRIDEMEN RIGHT TOTAL  KNEE WITH POLY EXCHANGE;  Surgeon: Mauri [place], MD;  [facility];  Service: Orthopedics;  Laterality: Right;   IR GENERIC HISTORICAL  06/17/2016   IR RADIOLOGIST EVAL & MGMT 06/17/2016 Ascencion Dike, PA-C [facility] INTERV RAD   LAPAROSCOPY N/A 01/15/2016   Procedure: LAPAROSCOPY DIAGNOSTIC;  Surgeon: Stark Klein, MD;  Location: [place];  Service: General;  Laterality: N/A;   LYSIS OF ADHESION  05/18/2016   Procedure: LYSIS OF ADHESION;  Surgeon: Stark Klein, MD;  Location: [place];  Service: General;;   NEPHRECTOMY Left    renal cell cancer   PACEMAKER INSERTION  07/04/2011   MDT PPM implanted by Dr Doreatha Lew for  tachy/brady; managed by Dr Thompson Grayer    PATELLAR TENDON REPAIR Right 03/06/2014   Procedure: [place];  Surgeon: Mauri [place], MD;  [facility];  Service: Orthopedics;  Laterality: Right;   PATENT DUCTUS ARTERIOUS REPAIR  ~ 1949   TOTAL ABDOMINAL HYSTERECTOMY  85   TOTAL HIP REVISION Right 2009   initial replacment surg   TOTAL KNEE ARTHROPLASTY  02/07/2012   Procedure: TOTAL KNEE ARTHROPLASTY;  Surgeon: Mauri Pole, MD;  [facility];  Service: Orthopedics;  Laterality: Right;   TOTAL KNEE REVISION Right 07/29/2014   Procedure: RIGHT KNEE REVISION OF PREVIOUS REPAIR EXTENSOR MECHANISM;  Surgeon: Mauri [place], MD;  [facility];  Service: Orthopedics;  Laterality: Right;   TRANSURETHRAL RESECTION OF BLADDER TUMOR N/A 07/21/2018   Procedure: TRANSURETHRAL RESECTION OF BLADDER TUMOR (TURBT);  Surgeon: Alexis Frock, MD;  [facility];  Service: Urology;  Laterality: N/A;   TRANSURETHRAL RESECTION OF BLADDER TUMOR WITH MITOMYCIN-C N/A 05/01/2018   Procedure: TRANSURETHRAL RESECTION OF BLADDER TUMOR WITH POST OP INSTILLATION OF GEMCITABINE;  Surgeon: Lucas Mallow, MD;  [facility];  Service: Urology;  Laterality: N/A;   WHIPPLE PROCEDURE N/A 05/18/2016   Procedure: EXPLORATORY LAPAROTOMY;  Surgeon: Stark Klein, MD;  Location: [place];  Service: General;  Laterality: N/A;     No outpatient medications have been marked as taking for the 01/09/19 encounter (Office Visit) with Lendon Colonel, NP.     Allergies:   Adhesive [tape]; Amiodarone; Hydrocodone; Remeron [mirtazapine]; Codeine; Morphine and related; Reglan [metoclopramide]; Requip [ropinirole hcl]; Zinc; and Other   Social History   Tobacco Use   Smoking status: Never Smoker   Smokeless tobacco: Never Used  Substance Use Topics   Alcohol use: No   Drug use: No     Family Hx: The patient's family history includes Colon cancer (age of onset: 47) in her  mother; Heart attack in her father; Heart disease in her father, maternal grandmother, and mother.  ROS:   Please see the history of present illness.    All other systems reviewed and are negative.   Prior CV studies:   The following studies were reviewed today:  Echocardiogram 11/14/2018 Left ventricle: The cavity size was normal. There was mild   concentric hypertrophy. Systolic function was normal. The   estimated ejection fraction was in the range of 60%  to 65%. Wall   motion was normal; there were no regional wall motion   abnormalities. The study was not technically sufficient to allow   evaluation of LV diastolic dysfunction due to atrial   fibrillation. - Aortic valve: Valve mobility was restricted. There was mild to   moderate stenosis. There was moderate regurgitation. Peak   gradient (S): 38 mm Hg. Valve area (VTI): 0.55 cm^2. Valve area   (Vmax): 0.57 cm^2. Valve area (Vmean): 0.59 cm^2. - Aortic root: The aortic root was normal in size. - Ascending aorta: The ascending aorta was normal in size. - Mitral valve: Calcified annulus. Moderately thickened, severely   calcified leaflets . Mobility was restricted. The findings are   consistent with moderate stenosis. There was mild regurgitation.   Valve area by continuity equation (using LVOT flow): 0.82 cm^2. - Left atrium: The atrium was mildly dilated. - Right ventricle: The cavity size was mildly dilated. Wall   thickness was normal. Systolic function was mildly reduced. - Right atrium: The atrium was mildly dilated. - Tricuspid valve: There was moderate regurgitation. - Pulmonary arteries: Systolic pressure was moderately to severely   increased. PA peak pressure: 55 mm Hg (S). - Inferior vena cava: The vessel was dilated. The respirophasic   diameter changes were blunted (< 50%), consistent with elevated   central venous pressure. - Pericardium, extracardiac: There was no pericardial effusion.  Labs/Other Tests and  Data Reviewed:    EKG:    Recent Labs: 10/30/2018: ALT 16; BUN 22; Creat 0.73; Hemoglobin 13.3; Magnesium 1.5; Platelets 259; Potassium 3.5; Sodium 142; TSH 1.07   Recent Lipid Panel Lab Results  Component Value Date/Time   CHOL 143 10/30/2018 11:45 AM   TRIG 143 10/30/2018 11:45 AM   HDL 43 (L) 10/30/2018 11:45 AM   CHOLHDL 3.3 10/30/2018 11:45 AM   LDLCALC 76 10/30/2018 11:45 AM    Wt Readings from Last 3 Encounters:  01/09/19 98 lb (44.5 kg)  01/05/19 102 lb (46.3 kg)  01/03/19 191 lb 12.8 oz (87 kg)     Objective:    Vital Signs:  BP 118/78    Pulse 84    Wt 98 lb (44.5 kg)    BMI 19.79 kg/m    Unable to assess over the phone.  ASSESSMENT & PLAN:    1.  Chronic diastolic CHF: I believe the patient has been over diuresed.  I am still waiting on labs concerning her recent blood draw 4 days ago.  She is being followed by [facility].  I have advised the patient's daughter to stop Lasix and only use as needed I believe that she may be a little dehydrated which is causing her listlessness and fatigue.  I explained to her that diltiazem can cause some mild lower extremity edema.  Would likely need to just go by her weight.  As long as her weight is staying around 97 pounds she should not be taking any Lasix or potassium.  If weight goes up 3 to 4 pounds in 24 to 48 hours she may take 1 as needed 20 mg Lasix with potassium.  And then continue to hold Lasix until needed again.  I think she needs to also increase her fluids temporarily with the cessation of the Lasix in order to help with dehydration.  2.  Hypertension: Daughter states that blood pressures been labile.  Going as high as 149/88 after climbing stairs and then decreasing to 118/78.  We will continue current medication regimen  with the exception of the changes in the Lasix.  Review labs when available.  3.  Atrial fibrillation: Heart rate gets elevated during strenuous activity by climbing stairs from her bedroom up  to the main floor.  Settles out once she is rested.  I would continue the diltiazem and metoprolol as directed.  She is also on Coumadin therapy and is due to follow-up in our office for ongoing management of dosing and INR checks.  4.  Pacemaker in situ: Most recent remote pacemaker check on 01/01/2019, revealed 9.6% A. fib burden, V rates elevated in AF.  Presenting rhythm AF.  Continue remote monitoring and follow-up with Dr. Rayann Heman.   COVID-19 Education: The signs and symptoms of COVID-19 were discussed with the patient and how to seek care for testing (follow up with PCP or arrange E-visit).  The importance of social distancing was discussed today.  Time:   Today, I have spent 16minutes with the patient with telehealth technology discussing the above problems.     Medication Adjustments/Labs and Tests Ordered: Current medicines are reviewed at length with the patient today.  Concerns regarding medicines are outlined above.   Tests Ordered: No orders of the defined types were placed in this encounter.   Medication Changes: Meds ordered this encounter  Medications   furosemide (LASIX) 20 MG tablet    Sig: Take 1 tablet (20 mg total) by mouth daily as needed (For weight gain of greater than 3 lbs).    Dispense:  60 tablet    Refill:  3   metoprolol tartrate (LOPRESSOR) 100 MG tablet    Sig: Take 0.5 tablets (50 mg total) by mouth 2 (two) times daily.    Dispense:  30 tablet    Refill:  6    Disposition:  Follow up previously scheduled appointment with Dr. [name] on 01/24/2019.  Signed, Phill Myron. West Pugh, ANP, AACC  01/09/2019 8:37 AM    [facility]

## 2019-01-09 NOTE — Patient Instructions (Signed)
Medication Instructions:  TAKE LASIX 20MG  AS NEEDED FOR WEIGHT GAIN TAKE METOPROLOL 50MG  TWICE DAILY If you need a refill on your cardiac medications before your next appointment, please call your pharmacy.  Follow-Up: You will need a follow up appointment in [place] .  You may see [name], MD or one of the following Advanced Practice Providers on your designated Care Team:  Almyra Deforest, PA-C Fabian Sharp, [place]    , Jory Sims, DNP, [place], you and your health needs are our priority.  As part of our continuing mission to provide you with exceptional heart care, we have created designated Provider Care Teams.  These Care Teams include your primary Cardiologist (physician) and Advanced Practice Providers (APPs -  Physician Assistants and Nurse Practitioners) who all work together to provide you with the care you need, when you need it.  Thank you for choosing [facility] at [facility]!!

## 2019-01-10 DIAGNOSIS — Z8553 Personal history of malignant neoplasm of renal pelvis: Secondary | ICD-10-CM | POA: Diagnosis not present

## 2019-01-10 DIAGNOSIS — I2510 Atherosclerotic heart disease of native coronary artery without angina pectoris: Secondary | ICD-10-CM | POA: Diagnosis not present

## 2019-01-10 DIAGNOSIS — H6523 Chronic serous otitis media, bilateral: Secondary | ICD-10-CM | POA: Diagnosis not present

## 2019-01-10 DIAGNOSIS — Z905 Acquired absence of kidney: Secondary | ICD-10-CM | POA: Diagnosis not present

## 2019-01-10 DIAGNOSIS — Z8673 Personal history of transient ischemic attack (TIA), and cerebral infarction without residual deficits: Secondary | ICD-10-CM | POA: Diagnosis not present

## 2019-01-10 DIAGNOSIS — Z8551 Personal history of malignant neoplasm of bladder: Secondary | ICD-10-CM | POA: Diagnosis not present

## 2019-01-10 DIAGNOSIS — Z85028 Personal history of other malignant neoplasm of stomach: Secondary | ICD-10-CM | POA: Diagnosis not present

## 2019-01-10 DIAGNOSIS — Z7901 Long term (current) use of anticoagulants: Secondary | ICD-10-CM | POA: Diagnosis not present

## 2019-01-10 DIAGNOSIS — I050 Rheumatic mitral stenosis: Secondary | ICD-10-CM | POA: Diagnosis not present

## 2019-01-10 DIAGNOSIS — G2581 Restless legs syndrome: Secondary | ICD-10-CM | POA: Diagnosis not present

## 2019-01-10 DIAGNOSIS — K219 Gastro-esophageal reflux disease without esophagitis: Secondary | ICD-10-CM | POA: Diagnosis not present

## 2019-01-10 DIAGNOSIS — I480 Paroxysmal atrial fibrillation: Secondary | ICD-10-CM | POA: Diagnosis not present

## 2019-01-10 DIAGNOSIS — I130 Hypertensive heart and chronic kidney disease with heart failure and stage 1 through stage 4 chronic kidney disease, or unspecified chronic kidney disease: Secondary | ICD-10-CM | POA: Diagnosis not present

## 2019-01-10 DIAGNOSIS — K3184 Gastroparesis: Secondary | ICD-10-CM | POA: Diagnosis not present

## 2019-01-10 DIAGNOSIS — E039 Hypothyroidism, unspecified: Secondary | ICD-10-CM | POA: Diagnosis not present

## 2019-01-10 DIAGNOSIS — G5791 Unspecified mononeuropathy of right lower limb: Secondary | ICD-10-CM | POA: Diagnosis not present

## 2019-01-10 DIAGNOSIS — N189 Chronic kidney disease, unspecified: Secondary | ICD-10-CM | POA: Diagnosis not present

## 2019-01-10 DIAGNOSIS — I5033 Acute on chronic diastolic (congestive) heart failure: Secondary | ICD-10-CM | POA: Diagnosis not present

## 2019-01-10 DIAGNOSIS — I272 Pulmonary hypertension, unspecified: Secondary | ICD-10-CM | POA: Diagnosis not present

## 2019-01-10 DIAGNOSIS — Z950 Presence of cardiac pacemaker: Secondary | ICD-10-CM | POA: Diagnosis not present

## 2019-01-10 DIAGNOSIS — Z96651 Presence of right artificial knee joint: Secondary | ICD-10-CM | POA: Diagnosis not present

## 2019-01-11 NOTE — Telephone Encounter (Signed)
Returned call to [employer] with [facility] no answer.[place].

## 2019-01-11 NOTE — Telephone Encounter (Signed)
F/U Message             Tiffany with [facility] is calling to give [name] up date on patient. Pls call  [phone]

## 2019-01-12 DIAGNOSIS — Z8673 Personal history of transient ischemic attack (TIA), and cerebral infarction without residual deficits: Secondary | ICD-10-CM | POA: Diagnosis not present

## 2019-01-12 DIAGNOSIS — N189 Chronic kidney disease, unspecified: Secondary | ICD-10-CM | POA: Diagnosis not present

## 2019-01-12 DIAGNOSIS — H6523 Chronic serous otitis media, bilateral: Secondary | ICD-10-CM | POA: Diagnosis not present

## 2019-01-12 DIAGNOSIS — Z85028 Personal history of other malignant neoplasm of stomach: Secondary | ICD-10-CM | POA: Diagnosis not present

## 2019-01-12 DIAGNOSIS — I130 Hypertensive heart and chronic kidney disease with heart failure and stage 1 through stage 4 chronic kidney disease, or unspecified chronic kidney disease: Secondary | ICD-10-CM | POA: Diagnosis not present

## 2019-01-12 DIAGNOSIS — E039 Hypothyroidism, unspecified: Secondary | ICD-10-CM | POA: Diagnosis not present

## 2019-01-12 DIAGNOSIS — Z7901 Long term (current) use of anticoagulants: Secondary | ICD-10-CM | POA: Diagnosis not present

## 2019-01-12 DIAGNOSIS — Z8553 Personal history of malignant neoplasm of renal pelvis: Secondary | ICD-10-CM | POA: Diagnosis not present

## 2019-01-12 DIAGNOSIS — I050 Rheumatic mitral stenosis: Secondary | ICD-10-CM | POA: Diagnosis not present

## 2019-01-12 DIAGNOSIS — I5033 Acute on chronic diastolic (congestive) heart failure: Secondary | ICD-10-CM | POA: Diagnosis not present

## 2019-01-12 DIAGNOSIS — I272 Pulmonary hypertension, unspecified: Secondary | ICD-10-CM | POA: Diagnosis not present

## 2019-01-12 DIAGNOSIS — K3184 Gastroparesis: Secondary | ICD-10-CM | POA: Diagnosis not present

## 2019-01-12 DIAGNOSIS — Z8551 Personal history of malignant neoplasm of bladder: Secondary | ICD-10-CM | POA: Diagnosis not present

## 2019-01-12 DIAGNOSIS — K219 Gastro-esophageal reflux disease without esophagitis: Secondary | ICD-10-CM | POA: Diagnosis not present

## 2019-01-12 DIAGNOSIS — Z96651 Presence of right artificial knee joint: Secondary | ICD-10-CM | POA: Diagnosis not present

## 2019-01-12 DIAGNOSIS — Z950 Presence of cardiac pacemaker: Secondary | ICD-10-CM | POA: Diagnosis not present

## 2019-01-12 DIAGNOSIS — G5791 Unspecified mononeuropathy of right lower limb: Secondary | ICD-10-CM | POA: Diagnosis not present

## 2019-01-12 DIAGNOSIS — I480 Paroxysmal atrial fibrillation: Secondary | ICD-10-CM | POA: Diagnosis not present

## 2019-01-12 DIAGNOSIS — Z905 Acquired absence of kidney: Secondary | ICD-10-CM | POA: Diagnosis not present

## 2019-01-12 DIAGNOSIS — G2581 Restless legs syndrome: Secondary | ICD-10-CM | POA: Diagnosis not present

## 2019-01-12 DIAGNOSIS — I2510 Atherosclerotic heart disease of native coronary artery without angina pectoris: Secondary | ICD-10-CM | POA: Diagnosis not present

## 2019-01-15 ENCOUNTER — Telehealth: Payer: Self-pay | Admitting: Internal Medicine

## 2019-01-15 ENCOUNTER — Other Ambulatory Visit: Payer: Self-pay | Admitting: Physician Assistant

## 2019-01-15 MED ORDER — DILTIAZEM HCL ER COATED BEADS 180 MG PO CP24: 180.0000 mg | ORAL_CAPSULE | Freq: Two times a day (BID) | ORAL | 1 refills | Status: DC

## 2019-01-15 NOTE — Telephone Encounter (Signed)
LMOVM for pt daughter to call my direct number. I will try to answer any questions she may have concerning her mother device.

## 2019-01-15 NOTE — Telephone Encounter (Signed)
Spoke with pt daughter the pt did indeed sent a transmission 01/01/2019. I told her to disregard the letter and the pt is not due for another transmission 03/01/2019.

## 2019-01-15 NOTE — Telephone Encounter (Signed)
New message:   Patient daughter calling concering her mother device. They received a message concerning her device. Please patient daughter.

## 2019-01-16 ENCOUNTER — Other Ambulatory Visit: Payer: Self-pay

## 2019-01-16 ENCOUNTER — Ambulatory Visit (HOSPITAL_COMMUNITY)
Admission: RE | Admit: 2019-01-16 | Discharge: 2019-01-16 | Disposition: A | Discharge status: Home / Self Care | Payer: Medicare Other | Source: Ambulatory Visit | Attending: Physician Assistant | Admitting: Physician Assistant

## 2019-01-16 ENCOUNTER — Encounter (HOSPITAL_COMMUNITY): Payer: Self-pay | Admitting: Physician Assistant

## 2019-01-16 ENCOUNTER — Ambulatory Visit: Payer: Self-pay | Admitting: Diagnostic Neuroimaging

## 2019-01-16 VITALS — BP 137/80 | HR 68 | Ht 59.0 in | Wt 95.6 lb

## 2019-01-16 DIAGNOSIS — G5791 Unspecified mononeuropathy of right lower limb: Secondary | ICD-10-CM | POA: Diagnosis not present

## 2019-01-16 DIAGNOSIS — N189 Chronic kidney disease, unspecified: Secondary | ICD-10-CM | POA: Diagnosis not present

## 2019-01-16 DIAGNOSIS — K219 Gastro-esophageal reflux disease without esophagitis: Secondary | ICD-10-CM | POA: Diagnosis not present

## 2019-01-16 DIAGNOSIS — I050 Rheumatic mitral stenosis: Secondary | ICD-10-CM | POA: Diagnosis not present

## 2019-01-16 DIAGNOSIS — I5033 Acute on chronic diastolic (congestive) heart failure: Secondary | ICD-10-CM | POA: Diagnosis not present

## 2019-01-16 DIAGNOSIS — Z8553 Personal history of malignant neoplasm of renal pelvis: Secondary | ICD-10-CM | POA: Diagnosis not present

## 2019-01-16 DIAGNOSIS — Z950 Presence of cardiac pacemaker: Secondary | ICD-10-CM | POA: Diagnosis not present

## 2019-01-16 DIAGNOSIS — I480 Paroxysmal atrial fibrillation: Secondary | ICD-10-CM

## 2019-01-16 DIAGNOSIS — I272 Pulmonary hypertension, unspecified: Secondary | ICD-10-CM | POA: Diagnosis not present

## 2019-01-16 DIAGNOSIS — Z905 Acquired absence of kidney: Secondary | ICD-10-CM | POA: Diagnosis not present

## 2019-01-16 DIAGNOSIS — Z96651 Presence of right artificial knee joint: Secondary | ICD-10-CM | POA: Diagnosis not present

## 2019-01-16 DIAGNOSIS — G2581 Restless legs syndrome: Secondary | ICD-10-CM | POA: Diagnosis not present

## 2019-01-16 DIAGNOSIS — Z85028 Personal history of other malignant neoplasm of stomach: Secondary | ICD-10-CM | POA: Diagnosis not present

## 2019-01-16 DIAGNOSIS — Z8673 Personal history of transient ischemic attack (TIA), and cerebral infarction without residual deficits: Secondary | ICD-10-CM | POA: Diagnosis not present

## 2019-01-16 DIAGNOSIS — E039 Hypothyroidism, unspecified: Secondary | ICD-10-CM | POA: Diagnosis not present

## 2019-01-16 DIAGNOSIS — I2510 Atherosclerotic heart disease of native coronary artery without angina pectoris: Secondary | ICD-10-CM | POA: Diagnosis not present

## 2019-01-16 DIAGNOSIS — H6523 Chronic serous otitis media, bilateral: Secondary | ICD-10-CM | POA: Diagnosis not present

## 2019-01-16 DIAGNOSIS — Z8551 Personal history of malignant neoplasm of bladder: Secondary | ICD-10-CM | POA: Diagnosis not present

## 2019-01-16 DIAGNOSIS — K3184 Gastroparesis: Secondary | ICD-10-CM | POA: Diagnosis not present

## 2019-01-16 DIAGNOSIS — I130 Hypertensive heart and chronic kidney disease with heart failure and stage 1 through stage 4 chronic kidney disease, or unspecified chronic kidney disease: Secondary | ICD-10-CM | POA: Diagnosis not present

## 2019-01-16 DIAGNOSIS — Z7901 Long term (current) use of anticoagulants: Secondary | ICD-10-CM | POA: Diagnosis not present

## 2019-01-16 NOTE — Progress Notes (Signed)
Electrophysiology TeleHealth Note   Due to national recommendations of social distancing due to [place] 19, Audio/video telehealth visit is felt to be most appropriate for this patient at this time.  See MyChart message for patient consent regarding telehealth for the Atrial Fibrillation Clinic.    Date:  01/16/2019   ID:  [name], DOB June 27, 1934, MRN [id number]  Location: home  Provider location: [address] [place], [place] [address] Evaluation Performed: Follow up  PCP:  [name], MD  Primary Cardiologist:  Dr [name] Primary Electrophysiologist: Dr Rayann Heman   CC: Follow up for atrial fibrillation   History of Present Illness: [name] is a 85 y.o. female who presents via audio/video conferencing for a telehealth visit today. Patient reports that she is feeling much better since readjusting her Lasix dose. She states that she is not nearly as fatigued and is able to walk up the stairs and to the mailbox without becoming very SOB. She has not had any heart racing or palpitations. She is taking a K+ supplement.   Today, she denies symptoms of palpitations, chest pain, orthopnea, PND, lower extremity edema, claudication, dizziness, presyncope, syncope, bleeding, or neurologic sequela. The patient is tolerating medications without difficulties and is otherwise without complaint today.   she denies symptoms of cough, fevers, chills, or new SOB worrisome for COVID 19.     Atrial Fibrillation Risk Factors:  she does not have symptoms or diagnosis of sleep apnea.   she has a BMI of Body mass index is 19.31 kg/m.[name] Filed Weights   01/16/19 1035  Weight: 43.4 kg    Past Medical History:  Diagnosis Date  . Adenocarcinoma of stomach ([place]) 11/11/2015   gastric mass on egd  . Anal fissure   . Anemia    hx 3/17 iron infusion, on aranesp and injected B12 since 11/2015.   [name] Anxiety   . Aortic root dilatation ([place])   . Arthritis    oa  . 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 CAD (coronary artery disease)   . Chronic kidney disease    only has one kidney rt lft rem 03 ca     dr. Risa Grill, bladder cancer 05-04-2018  . Elevated LFTs    hepatic steatosis, none recent  . Gastroparesis   . GERD (gastroesophageal reflux disease)   . History of uterine cancer 1980s   treated with hysterectomy, external radiation and radiation seed implants.   [name] HTN (hypertension)   . Hyperlipemia   . Hypothyroidism   . Iron deficiency anemia due to chronic blood loss 11/24/2015  . LVH (left ventricular hypertrophy) 11/14/2015   Mild, noted on ECHO  . Moderate aortic stenosis   . Neuropathy    right leg  . No blood products 02/27/2016  . Osteomyelitis ([place]) as child  . PAF (paroxysmal atrial fibrillation) ([place])   . PONV (postoperative nausea and vomiting)    long time ago  . Presence of permanent cardiac pacemaker   . Radiation proctitis   . Restless leg syndrome   . Stroke ([place]) 15   partial stroke left legally blind; left eye   . Tachycardia-bradycardia syndrome ([place])    s/p PPM by Dr Doreatha Lew (MDT) 07/03/10   Past Surgical History:  Procedure Laterality Date  . CARDIAC CATHETERIZATION  2008  . CHOLECYSTECTOMY  1970s  . COLONOSCOPY N/A 11/11/2015   Procedure: COLONOSCOPY;  Surgeon: [name], MD;  [facility]: [facility];  Service: Endoscopy;  Laterality: N/A;  .  CYSTOSCOPY W/ RETROGRADES Right 05/01/2018   Procedure: CYSTOSCOPY WITH RIGHT RETROGRADE PYELOGRAM;  Surgeon: Lucas Mallow, MD;  Location: [facility];  Service: Urology;  Laterality: Right;  . CYSTOSCOPY W/ RETROGRADES Right 07/21/2018   Procedure: CYSTOSCOPY WITH RIGHT RETROGRADE PYELOGRAM;  Surgeon: Alexis Frock, MD;  [facility];  Service: Urology;  Laterality: Right;  . ESOPHAGOGASTRODUODENOSCOPY N/A 11/11/2015   Procedure: ESOPHAGOGASTRODUODENOSCOPY (EGD);  Surgeon: [name], MD;  Location: [facility];  Service: Endoscopy;  Laterality: N/A;  . ESOPHAGOGASTRODUODENOSCOPY N/A 02/05/2016    Procedure: ESOPHAGOGASTRODUODENOSCOPY (EGD);  Surgeon: Ladene Artist, MD;  Location[facility];  Service: Endoscopy;  Laterality: N/A;  . ESOPHAGOGASTRODUODENOSCOPY N/A 04/02/2016   Procedure: ESOPHAGOGASTRODUODENOSCOPY (EGD);  Surgeon: [name], MD;  Location: [facility];  Service: Endoscopy;  Laterality: N/A;  . ESOPHAGOGASTRODUODENOSCOPY (EGD) WITH PROPOFOL N/A 04/06/2017   Procedure: ESOPHAGOGASTRODUODENOSCOPY (EGD) WITH PROPOFOL;  Surgeon: [name], MD;  [facility];  Service: Endoscopy;  Laterality: N/A;  . GASTRECTOMY N/A 01/15/2016   Procedure: OPEN PARTIAL GASTRECTOMY;  Surgeon: Stark Klein, MD;  Location: [facility];  Service: General;  Laterality: N/A;  . GASTRECTOMY Left 02/24/2016   Procedure: OPEN J TUBE;  Surgeon: Stark Klein, MD;  Location: [facility];  Service: General;  Laterality: Left;  [name] GASTROJEJUNOSTOMY N/A 05/18/2016   Procedure: ROUX EN Y GASTROJEJUNOSTOMY;  Surgeon: Stark Klein, MD;  Location: [facility];  Service: General;  Laterality: N/A;  . I&D KNEE WITH POLY EXCHANGE Right 12/17/2013   Procedure: IRRIGATION AND DEBRIDEMEN RIGHT TOTAL  KNEE WITH POLY EXCHANGE;  Surgeon: Mauri Pole, MD;  [facility];  Service: Orthopedics;  Laterality: Right;  . IR GENERIC HISTORICAL  06/17/2016   IR RADIOLOGIST EVAL & MGMT 06/17/2016 Ascencion Dike, PA-C [facility] INTERV RAD  . LAPAROSCOPY N/A 01/15/2016   Procedure: LAPAROSCOPY DIAGNOSTIC;  Surgeon: Stark Klein, MD;  Location: [facility];  Service: General;  Laterality: N/A;  . LYSIS OF ADHESION  05/18/2016   Procedure: LYSIS OF ADHESION;  Surgeon: Stark Klein, MD;  Location: [facility];  Service: General;;  . NEPHRECTOMY Left    renal cell cancer  . PACEMAKER INSERTION  07/04/2011   MDT PPM implanted by Dr Doreatha Lew for tachy/brady; managed by Dr Thompson Grayer   . PATELLAR TENDON REPAIR Right 03/06/2014   Procedure: AVULSION WITH PATELLA TENDON REPAIR;  Surgeon: Mauri Pole, MD;  [facility];  Service: Orthopedics;   Laterality: Right;  . PATENT DUCTUS ARTERIOUS REPAIR  ~ 1949  . TOTAL ABDOMINAL HYSTERECTOMY  85  . TOTAL HIP REVISION Right 2009   initial replacment surg  . TOTAL KNEE ARTHROPLASTY  02/07/2012   Procedure: TOTAL KNEE ARTHROPLASTY;  Surgeon: Mauri Pole, MD;  [facility];  Service: Orthopedics;  Laterality: Right;  . TOTAL KNEE REVISION Right 07/29/2014   Procedure: RIGHT KNEE REVISION OF PREVIOUS REPAIR EXTENSOR MECHANISM;  Surgeon: Mauri Pole, MD;  [facility];  Service: Orthopedics;  Laterality: Right;  . TRANSURETHRAL RESECTION OF BLADDER TUMOR N/A 07/21/2018   Procedure: TRANSURETHRAL RESECTION OF BLADDER TUMOR (TURBT);  Surgeon: Alexis Frock, MD;  [facility];  Service: Urology;  Laterality: N/A;  . TRANSURETHRAL RESECTION OF BLADDER TUMOR WITH MITOMYCIN-C N/A 05/01/2018   Procedure: TRANSURETHRAL RESECTION OF BLADDER TUMOR WITH POST OP INSTILLATION OF GEMCITABINE;  Surgeon: Lucas Mallow, MD;  [facility];  Service: Urology;  Laterality: N/A;  . WHIPPLE PROCEDURE N/A 05/18/2016   Procedure: EXPLORATORY LAPAROTOMY;  Surgeon: Stark Klein, MD;  Location: [facility] OR;  Service: General;  Laterality: N/A;     Current Outpatient Medications  Medication Sig Dispense Refill  . acetaminophen (TYLENOL) 500 MG tablet Take 500-1,000 mg by mouth every 6 (six) hours as needed for mild pain or headache.     . Biotin 10 MG CAPS Take 10 mg by mouth daily.     . cetirizine (ZYRTEC) 10 MG tablet Take 10 mg by mouth daily.    . Cholecalciferol (VITAMIN D-3) 5000 units TABS Take 5,000 Units by mouth 2 (two) times a day.     . diltiazem (CARDIZEM CD) 180 MG 24 hr capsule Take 1 capsule (180 mg total) by mouth 2 (two) times a day. 180 capsule 1  . famotidine (PEPCID) 40 MG tablet Take 1 tablet (40 mg total) by mouth daily. 90 tablet 3  . furosemide (LASIX) 20 MG tablet Take 1 tablet (20 mg total) by mouth daily as needed (For weight gain of greater than 3 lbs). 60 tablet 3  .  gabapentin (NEURONTIN) 300 MG capsule Take 1 capsule by mouth 2 (two) times a day.    . levothyroxine (SYNTHROID, LEVOTHROID) 100 MCG tablet TAKE 1/2 TABLET BY MOUTH DAILY BEFORE BREAKFAST. 45 tablet 1  . loperamide (IMODIUM) 2 MG capsule Take by mouth as needed for diarrhea or loose stools. Up to 16 day    . losartan (COZAAR) 25 MG tablet Take 2 tablets (50 mg total) by mouth daily. 90 tablet 1  . metoprolol tartrate (LOPRESSOR) 100 MG tablet Take 0.5 tablets (50 mg total) by mouth 2 (two) times daily. 30 tablet 6  . ondansetron (ZOFRAN-ODT) 8 MG disintegrating tablet Take 8 mg by mouth every 8 (eight) hours as needed for nausea or vomiting.     . potassium chloride (K-DUR) 10 MEQ tablet Take 20 mEq by mouth daily.    . sucralfate (CARAFATE) 1 g tablet TAKE 1 TABLET (1 G TOTAL) BY MOUTH 4 (FOUR) TIMES DAILY - WITH MEALS AND AT BEDTIME. (Patient taking differently: Take 1 g by mouth 3 (three) times daily. ) 360 tablet 3  . warfarin (COUMADIN) 2 MG tablet TAKE 1 TABLET BY MOUTH DAILY OR AS DIRECTED 90 tablet 1  . nitroGLYCERIN (NITROSTAT) 0.4 MG SL tablet Place 1 tablet (0.4 mg total) under the tongue every 5 (five) minutes as needed for chest pain (MAX 3 TABLETS). (Patient not taking: Reported on 01/16/2019) 25 tablet prn  . simethicone (MYLICON) 80 MG chewable tablet Chew 80 mg by mouth every 6 (six) hours as needed for flatulence.     No current facility-administered medications for this encounter.     Allergies:   Adhesive [tape]; Amiodarone; Hydrocodone; Remeron [mirtazapine]; Codeine; Morphine and related; Reglan [metoclopramide]; Requip [ropinirole hcl]; Zinc; and Other   Social History:  The patient  reports that she has never smoked. She has never used smokeless tobacco. She reports that she does not drink alcohol or use drugs.   Family History:  The patient's  family history includes Colon cancer (age of onset: 92) in her mother; Heart attack in her father; Heart disease in her father,  maternal grandmother, and mother.    ROS:  Please see the history of present illness.   All other systems are personally reviewed and negative.   Exam: Well appearing, alert and conversant, regular work of breathing,  good skin color  Recent Labs: 10/30/2018: ALT 16; BUN 22; Creat 0.73; Hemoglobin 13.3; Magnesium 1.5; Platelets 259; Potassium 3.5;  Sodium 142; TSH 1.07  personally reviewed    Other studies personally reviewed: Additional studies/ records that were reviewed today include: Epic notes    ASSESSMENT AND PLAN:  1.  Paroxysmal atrial fibrillation Patient doing very well on higher dose of diltiazem. Burden on last pacer check was 9.6%. Continue diltiazem 180 mg BID Continue warfarin  Per Dr Rayann Heman, should AF become more symptomatic or burden increases, could consider amiodarone.  This patients CHA2DS2-VASc Score and unadjusted Ischemic Stroke Rate (% per year) is equal to 11.2 % stroke rate/year from a score of 7  Above score calculated as 1 point each if present [CHF, HTN, DM, Vascular=MI/PAD/Aortic Plaque, Age if 65-74, or Female] Above score calculated as 2 points each if present [Age > 75, or Stroke/TIA/TE]  2. Bradycardia S/p PPM, followed by Dr Rayann Heman and device clinic.  3. HTN Stable, no changes today   COVID screen The patient does not have any symptoms that suggest any further testing/ screening at this time.  Social distancing reinforced today.    Follow-up with AF clinic in 3 months.   Current medicines are reviewed at length with the patient today.   The patient does not have concerns regarding her medicines.  The following changes were made today:  none  Labs/ tests ordered today include:  No orders of the defined types were placed in this encounter.   Patient Risk:  after full review of this patients clinical status, I feel that they are at moderate risk at this time.   Today, I have spent 14 minutes with the patient with telehealth  technology discussing atrial fibrillation, medications, and COVID-19 precautions.    Gwenlyn Perking PA-C 01/16/2019 11:04 AM  [facility] [address] [place], [place] [address] [phone]

## 2019-01-16 NOTE — Telephone Encounter (Signed)
PER TIFFANY MESSAGE HAS BEEN ADDRESSED AND PT IS GETTING REPEAT BMET DONE TODAY ./CY

## 2019-01-17 ENCOUNTER — Telehealth: Payer: Self-pay | Admitting: Cardiology

## 2019-01-17 NOTE — Telephone Encounter (Signed)
error 

## 2019-01-19 ENCOUNTER — Telehealth: Payer: Self-pay | Admitting: Adult Health

## 2019-01-19 DIAGNOSIS — Z96651 Presence of right artificial knee joint: Secondary | ICD-10-CM | POA: Diagnosis not present

## 2019-01-19 DIAGNOSIS — Z8553 Personal history of malignant neoplasm of renal pelvis: Secondary | ICD-10-CM | POA: Diagnosis not present

## 2019-01-19 DIAGNOSIS — Z8673 Personal history of transient ischemic attack (TIA), and cerebral infarction without residual deficits: Secondary | ICD-10-CM | POA: Diagnosis not present

## 2019-01-19 DIAGNOSIS — I130 Hypertensive heart and chronic kidney disease with heart failure and stage 1 through stage 4 chronic kidney disease, or unspecified chronic kidney disease: Secondary | ICD-10-CM | POA: Diagnosis not present

## 2019-01-19 DIAGNOSIS — I050 Rheumatic mitral stenosis: Secondary | ICD-10-CM | POA: Diagnosis not present

## 2019-01-19 DIAGNOSIS — Z950 Presence of cardiac pacemaker: Secondary | ICD-10-CM | POA: Diagnosis not present

## 2019-01-19 DIAGNOSIS — Z85028 Personal history of other malignant neoplasm of stomach: Secondary | ICD-10-CM | POA: Diagnosis not present

## 2019-01-19 DIAGNOSIS — K3184 Gastroparesis: Secondary | ICD-10-CM | POA: Diagnosis not present

## 2019-01-19 DIAGNOSIS — Z905 Acquired absence of kidney: Secondary | ICD-10-CM | POA: Diagnosis not present

## 2019-01-19 DIAGNOSIS — H6523 Chronic serous otitis media, bilateral: Secondary | ICD-10-CM | POA: Diagnosis not present

## 2019-01-19 DIAGNOSIS — I480 Paroxysmal atrial fibrillation: Secondary | ICD-10-CM | POA: Diagnosis not present

## 2019-01-19 DIAGNOSIS — I2510 Atherosclerotic heart disease of native coronary artery without angina pectoris: Secondary | ICD-10-CM | POA: Diagnosis not present

## 2019-01-19 DIAGNOSIS — N189 Chronic kidney disease, unspecified: Secondary | ICD-10-CM | POA: Diagnosis not present

## 2019-01-19 DIAGNOSIS — Z7901 Long term (current) use of anticoagulants: Secondary | ICD-10-CM | POA: Diagnosis not present

## 2019-01-19 DIAGNOSIS — G2581 Restless legs syndrome: Secondary | ICD-10-CM | POA: Diagnosis not present

## 2019-01-19 DIAGNOSIS — G5791 Unspecified mononeuropathy of right lower limb: Secondary | ICD-10-CM | POA: Diagnosis not present

## 2019-01-19 DIAGNOSIS — E039 Hypothyroidism, unspecified: Secondary | ICD-10-CM | POA: Diagnosis not present

## 2019-01-19 DIAGNOSIS — Z8551 Personal history of malignant neoplasm of bladder: Secondary | ICD-10-CM | POA: Diagnosis not present

## 2019-01-19 DIAGNOSIS — K219 Gastro-esophageal reflux disease without esophagitis: Secondary | ICD-10-CM | POA: Diagnosis not present

## 2019-01-19 DIAGNOSIS — I5033 Acute on chronic diastolic (congestive) heart failure: Secondary | ICD-10-CM | POA: Diagnosis not present

## 2019-01-19 DIAGNOSIS — I272 Pulmonary hypertension, unspecified: Secondary | ICD-10-CM | POA: Diagnosis not present

## 2019-01-19 NOTE — Telephone Encounter (Signed)
Called the daughter of the patient to get more information about her telephone encounter on 01/19/19. The lab results that the daughter is asking about are not back yet. So we are unable to give her those results. She also has a question about the patient continuing to take her potassium. Will forward this to the preop pool since [place]. Is not in the office.

## 2019-01-19 NOTE — Telephone Encounter (Signed)
New Message:    Daughter would like pt's lab results fro this week please. She also wants to know if pt needs to continue taking her Potassium?

## 2019-01-19 NOTE — Telephone Encounter (Signed)
Pt's daughter says that labs were drawn by [facility], nurse Tabitha. I called [facility] and spoke to nurse Tabitha who had drawn labs on 01/16/19. She was able to put me in touch with Juliann Pulse, RN at [facility] who gave me verbal results for Potassium of 4.6 and SCr of 0.85. She will also fax the complete lab report to our office.  The patient had stopped lasix on 5/5.  Since potassium is within normal range, would continue current potassium dose. Daughter is concerned that pt has had high potassium in the past.  I will route this note to Jory Sims for review and see if she wants another set of follow up labs.    FYI- Notes indicate that pt was to have labs drawn by [facility], however, apparently the patient is already being followed by [facility] who sends her labs to [facility]. Thus I had to track them down.  -I will also route this note to Caralyn Guile for [place].

## 2019-01-20 NOTE — Telephone Encounter (Signed)
Patient may stop potassium. She will need a repeat BMET in 2 weeks for status of potassium once she stops this.

## 2019-01-21 NOTE — Progress Notes (Signed)
Virtual Visit via Video Note   This visit type was conducted due to national recommendations for restrictions regarding the COVID-19 Pandemic (e.g. social distancing) in an effort to limit this patient's exposure and mitigate transmission in our community.  Due to her co-morbid illnesses, this patient is at least at moderate risk for complications without adequate follow up.  This format is felt to be most appropriate for this patient at this time.  All issues noted in this document were discussed and addressed.  A limited physical exam was performed with this format.  Please refer to the patient's chart for her consent to telehealth for [facility].   Date:  01/24/2019   ID:  [name], DOB [birth year], MRN [id number]  Patient Location: Home Provider Location: Office  PCP:  [name], MD  Cardiologist:  Kymari Nuon [name], MD  Electrophysiologist:  None   Evaluation Performed:  Follow-Up Visit  Chief Complaint:  afib  History of Present Illness:    [name] is a 85 y.o. female is seen for follow up Afib. She has a past history of tachybradycardia syndrome .  She has a permanent pacemaker implanted 07/01/10. She has a remote history of repair of a patent ductus arteriosus with repair. She has a history of moderate valvular aortic stenosis. She has had a history of congestive heart failure. She was admitted 08/25/12-08/29/12 for exacerbation of CHF. She was hospitalized again in November 2015 for congestive heart failure and pneumonia. Her echocardiogram 08/26/12 showed ejection fraction of 60-65% with grade 2 diastolic dysfunction and a peak aortic valve gradient of 29 with a mean aortic valve gradient of 14. Her last echocardiogram in March 2017 showed ejection fraction of 60-65%. The aortic valve showed mild- moderate stenosis and moderate regurgitation.  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 She had carotid Dopplers which did not show any significant obstruction.  In 2017 she developed symptoms of dyspnea and fatigue. She was found to be anemic. Anemia improved with iron and Epogen. 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 She underwent revision of gastric bypass in 2017 by [name]. More recently had TUR resection of a bladder tumor with intravesicular chemotherapy in August 2019. Tumor was invasive and treatment options currently underway with oncology. According to the patient she is not a candidate for IV chemo. She does not want to go through with cystectomy. She did have local resection of tumor in November.  In April she complained of increased palpitations while on steroids. Her diltiazem was increased. She later developed fluid retention and weight gain. This resolved with lasix which was later reduced to prn. On follow up today she states her weight is staying down. She denies any edema or SOB. She does house work and tries and walk daily when she can get outside. She is restricting salt intake. Planning to see urology in June.   The patient does not have symptoms concerning for COVID-19 infection (fever, chills, cough, or new shortness of breath).    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    Anxiety    Aortic root dilatation (HCC)    Arthritis    oa   Bright disease as child  CAD (coronary artery disease)    Chronic kidney disease    only has one kidney rt lft rem 03 ca     dr. Risa Grill, bladder cancer 05-04-2018   Elevated LFTs    hepatic steatosis, none recent   Gastroparesis    GERD (gastroesophageal reflux disease)    History of uterine cancer 1980s   treated with hysterectomy, external radiation and radiation seed implants.    HTN (hypertension)    Hyperlipemia      Hypothyroidism    Iron deficiency anemia due to chronic blood loss 11/24/2015   LVH (left ventricular hypertrophy) 11/14/2015   Mild, noted on ECHO   Moderate aortic stenosis    Neuropathy    right leg   No blood products 02/27/2016   Osteomyelitis ([place]) as child   PAF (paroxysmal atrial fibrillation) (HCC)    PONV (postoperative nausea and vomiting)    long time ago   Presence of permanent cardiac pacemaker    Radiation proctitis    Restless leg syndrome    Stroke ([place]) 15   partial stroke left legally blind; left eye    Tachycardia-bradycardia syndrome ([place])    s/p PPM by Dr Doreatha Lew (MDT) 07/03/10   Past Surgical History:  Procedure Laterality Date   CARDIAC CATHETERIZATION  2008   CHOLECYSTECTOMY  1970s   COLONOSCOPY N/A 11/11/2015   Procedure: COLONOSCOPY;  Surgeon: [name], MD;  Location: [facility];  Service: Endoscopy;  Laterality: N/A;   CYSTOSCOPY W/ RETROGRADES Right 05/01/2018   Procedure: CYSTOSCOPY WITH RIGHT RETROGRADE PYELOGRAM;  Surgeon: Lucas Mallow, MD;  Location: [facility];  Service: Urology;  Laterality: Right;   CYSTOSCOPY W/ RETROGRADES Right 07/21/2018   Procedure: CYSTOSCOPY WITH RIGHT RETROGRADE PYELOGRAM;  Surgeon: Alexis Frock, MD;  [facility];  Service: Urology;  Laterality: Right;   ESOPHAGOGASTRODUODENOSCOPY N/A 11/11/2015   Procedure: ESOPHAGOGASTRODUODENOSCOPY (EGD);  Surgeon: [name], MD;  Location: [facility];  Service: Endoscopy;  Laterality: N/A;   ESOPHAGOGASTRODUODENOSCOPY N/A 02/05/2016   Procedure: ESOPHAGOGASTRODUODENOSCOPY (EGD);  Surgeon: Ladene Artist, MD;  [facility];  Service: Endoscopy;  Laterality: N/A;   ESOPHAGOGASTRODUODENOSCOPY N/A 04/02/2016   Procedure: ESOPHAGOGASTRODUODENOSCOPY (EGD);  Surgeon: [name], MD;  Location: [facility];  Service: Endoscopy;  Laterality: N/A;   ESOPHAGOGASTRODUODENOSCOPY (EGD) WITH PROPOFOL N/A 04/06/2017   Procedure:  ESOPHAGOGASTRODUODENOSCOPY (EGD) WITH PROPOFOL;  Surgeon: [name], MD;  [facility];  Service: Endoscopy;  Laterality: N/A;   GASTRECTOMY N/A 01/15/2016   Procedure: OPEN PARTIAL GASTRECTOMY;  Surgeon: Stark Klein, MD;  Location: [place];  Service: General;  Laterality: N/A;   GASTRECTOMY Left 02/24/2016   Procedure: OPEN J TUBE;  Surgeon: Stark Klein, MD;  Location: [place];  Service: General;  Laterality: Left;   GASTROJEJUNOSTOMY N/A 05/18/2016   Procedure: ROUX EN [place];  Surgeon: Stark Klein, MD;  Location: [place];  Service: General;  Laterality: N/A;   I&D KNEE WITH POLY EXCHANGE Right 12/17/2013   Procedure: IRRIGATION AND DEBRIDEMEN RIGHT TOTAL  KNEE WITH POLY EXCHANGE;  Surgeon: Mauri Pole, MD;  [facility];  Service: Orthopedics;  Laterality: Right;   IR GENERIC HISTORICAL  06/17/2016   IR RADIOLOGIST EVAL & MGMT 06/17/2016 Ascencion Dike, PA-C [facility] INTERV RAD   LAPAROSCOPY N/A 01/15/2016   Procedure: LAPAROSCOPY DIAGNOSTIC;  Surgeon: Stark Klein, MD;  Location: [place];  Service: General;  Laterality: N/A;   LYSIS OF ADHESION  05/18/2016   Procedure: LYSIS OF ADHESION;  Surgeon: Stark Klein, MD;  Location: [place];  Service: General;;   NEPHRECTOMY Left    renal cell cancer   PACEMAKER INSERTION  07/04/2011   MDT PPM implanted by Dr Doreatha Lew for tachy/brady; managed by Dr Thompson Grayer    PATELLAR TENDON REPAIR Right 03/06/2014   Procedure: [place];  Surgeon: Mauri Pole, MD;  [facility];  Service: Orthopedics;  Laterality: Right;   PATENT DUCTUS ARTERIOUS REPAIR  ~ 1949   TOTAL ABDOMINAL HYSTERECTOMY  85   TOTAL HIP REVISION Right 2009   initial replacment surg   TOTAL KNEE ARTHROPLASTY  02/07/2012   Procedure: TOTAL KNEE ARTHROPLASTY;  Surgeon: Mauri Pole, MD;  [facility];  Service: Orthopedics;  Laterality: Right;   TOTAL KNEE REVISION Right 07/29/2014   Procedure: RIGHT KNEE REVISION OF  PREVIOUS REPAIR EXTENSOR MECHANISM;  Surgeon: Mauri Pole, MD;  [facility];  Service: Orthopedics;  Laterality: Right;   TRANSURETHRAL RESECTION OF BLADDER TUMOR N/A 07/21/2018   Procedure: TRANSURETHRAL RESECTION OF BLADDER TUMOR (TURBT);  Surgeon: Alexis Frock, MD;  [facility];  Service: Urology;  Laterality: N/A;   TRANSURETHRAL RESECTION OF BLADDER TUMOR WITH MITOMYCIN-C N/A 05/01/2018   Procedure: TRANSURETHRAL RESECTION OF BLADDER TUMOR WITH POST OP INSTILLATION OF GEMCITABINE;  Surgeon: Lucas Mallow, MD;  [facility];  Service: Urology;  Laterality: N/A;   WHIPPLE PROCEDURE N/A 05/18/2016   Procedure: EXPLORATORY LAPAROTOMY;  Surgeon: Stark Klein, MD;  Location: [place];  Service: General;  Laterality: N/A;     Current Meds  Medication Sig   acetaminophen (TYLENOL) 500 MG tablet Take 500-1,000 mg by mouth every 6 (six) hours as needed for mild pain or headache.    Biotin 10 MG CAPS Take 10 mg by mouth daily.    cetirizine (ZYRTEC) 10 MG tablet Take 10 mg by mouth daily.   Cholecalciferol (VITAMIN D-3) 5000 units TABS Take 5,000 Units by mouth 2 (two) times a day.    diltiazem (CARDIZEM CD) 180 MG 24 hr capsule Take 1 capsule (180 mg total) by mouth 2 (two) times a day.   famotidine (PEPCID) 40 MG tablet Take 1 tablet (40 mg total) by mouth daily.   furosemide (LASIX) 20 MG tablet Take 1 tablet (20 mg total) by mouth daily as needed (For weight gain of greater than 3 lbs).   gabapentin (NEURONTIN) 300 MG capsule Take 1 capsule by mouth 2 (two) times a day.   levothyroxine (SYNTHROID, LEVOTHROID) 100 MCG tablet TAKE 1/2 TABLET BY MOUTH DAILY BEFORE BREAKFAST.   loperamide (IMODIUM) 2 MG capsule Take by mouth as needed for diarrhea or loose stools. Up to 16 day   losartan (COZAAR) 25 MG tablet Take 2 tablets (50 mg total) by mouth daily.   metoprolol tartrate (LOPRESSOR) 100 MG tablet Take 0.5 tablets (50 mg total) by mouth 2 (two) times daily.    nitroGLYCERIN (NITROSTAT) 0.4 MG SL tablet Place 1 tablet (0.4 mg total) under the tongue every 5 (five) minutes as needed for chest pain (MAX 3 TABLETS).   ondansetron (ZOFRAN-ODT) 8 MG disintegrating tablet Take 8 mg by mouth every 8 (eight) hours as needed for nausea or vomiting.    sucralfate (CARAFATE) 1 g tablet TAKE 1 TABLET (1 G TOTAL) BY MOUTH 4 (FOUR) TIMES DAILY - WITH MEALS AND AT BEDTIME. (Patient taking differently: Take 1 g by mouth 3 (three) times daily. )   warfarin (COUMADIN) 2 MG tablet TAKE  1 TABLET BY MOUTH DAILY OR AS DIRECTED (Patient taking differently: TAKE 1 TABLET BY MOUTH DAILY OR AS DIRECTED)     Allergies:   Adhesive [tape]; Amiodarone; Hydrocodone; Remeron [mirtazapine]; Codeine; Morphine and related; Reglan [metoclopramide]; Requip [ropinirole hcl]; Zinc; and Other   Social History   Tobacco Use   Smoking status: Never Smoker   Smokeless tobacco: Never Used  Substance Use Topics   Alcohol use: No   Drug use: No     Family Hx: The patient's family history includes Colon cancer (age of onset: 85) in her mother; Heart attack in her father; Heart disease in her father, maternal grandmother, and mother.  ROS:   Please see the history of present illness.    All other systems reviewed and are negative.   Prior CV studies:   The following studies were reviewed today:  Last device remote is reviewed from Early PDF dated 01/01/2019 which reveals normal device function, afib 9%  Labs/Other Tests and Data Reviewed:    EKG:  No ECG reviewed.  Recent Labs: 10/30/2018: ALT 16; BUN 22; Creat 0.73; Hemoglobin 13.3; Magnesium 1.5; Platelets 259; Potassium 3.5; Sodium 142; TSH 1.07   Recent Lipid Panel Lab Results  Component Value Date/Time   CHOL 143 10/30/2018 11:45 AM   TRIG 143 10/30/2018 11:45 AM   HDL 43 (L) 10/30/2018 11:45 AM   CHOLHDL 3.3 10/30/2018 11:45 AM   LDLCALC 76 10/30/2018 11:45 AM    Wt Readings from Last 3 Encounters:  01/24/19  93 lb 6.4 oz (42.4 kg)  01/16/19 95 lb 9.6 oz (43.4 kg)  01/09/19 98 lb (44.5 kg)     Objective:    Vital Signs:  BP (!) 147/72    Pulse 62    Ht 4\' 11"  (1.499 m)    Wt 93 lb 6.4 oz (42.4 kg)    BMI 18.86 kg/m    VITAL SIGNS:  reviewed  General in no distress HEENT normal.  Respiration unlabored.  Mood normal Alert and oriented x 3. Neuro exam is nonfocal.   ASSESSMENT & PLAN:    1. Paroxysmal atrial fibrillation. increased frequency in April while on steroids. Improved now on higher Cardizem dose.  Mali Vasc score of 5. On Coumadin. Last INR 2.9.  2. Tachybradycardia syndrome with functioning dual-chamber pacemaker. 3. Essential hypertension. BP is now well controlled. On diltiazem, Toprol, losartan.   4. Hypercholesterolemia controlled. 5. Mild to moderate aortic stenosis 7. history of left nephrectomy secondary to kidney cancer 8. history of endometrial cancer 9. GERD with history of gastric bypass surgery. 10. History of cholesterol embolus to her left eye with central retinal artery occlusion and loss of vision 11. Bladder CA invasive- treated locally.   COVID-19 Education: The signs and symptoms of COVID-19 were discussed with the patient and how to seek care for testing (follow up with PCP or arrange E-visit).  The importance of social distancing was discussed today.  Time:   Today, I have spent 20 minutes with the patient with telehealth technology discussing the above problems.     Medication Adjustments/Labs and Tests Ordered: Current medicines are reviewed at length with the patient today.  Concerns regarding medicines are outlined above.   Tests Ordered: No orders of the defined types were placed in this encounter.   Medication Changes: No orders of the defined types were placed in this encounter.   Disposition:  Follow up in 4 month(s)  Signed, Elis Sauber [name], MD  01/24/2019 9:10 AM  [facility]

## 2019-01-22 ENCOUNTER — Encounter: Payer: Self-pay | Admitting: Internal Medicine

## 2019-01-22 ENCOUNTER — Telehealth: Payer: Self-pay

## 2019-01-22 NOTE — Telephone Encounter (Signed)
Spoke with pt's daughter. Per Jory Sims, NP for pt to stop potassium and have a BMET drawn in 2 weeks. Pt is aware of results. I will contact Tabitha who is pt's home health nurse to draw labs.

## 2019-01-22 NOTE — Addendum Note (Signed)
Addended by: Mendel Ryder on: 01/22/2019 09:37 AM   Modules accepted: Level of Service, SmartSet

## 2019-01-22 NOTE — Progress Notes (Signed)
Thank you! ° °KL °

## 2019-01-22 NOTE — Addendum Note (Signed)
Addended by: Mendel Ryder on: 01/22/2019 09:38 AM   Modules accepted: Orders

## 2019-01-22 NOTE — Telephone Encounter (Signed)
Tyreka, can you please let pt know can stop potasium per Jory Sims and arrange for follow up BMET in 2 weeks.

## 2019-01-22 NOTE — Telephone Encounter (Signed)
This encounter was created in error - please disregard.

## 2019-01-23 DIAGNOSIS — Z950 Presence of cardiac pacemaker: Secondary | ICD-10-CM | POA: Diagnosis not present

## 2019-01-23 DIAGNOSIS — N189 Chronic kidney disease, unspecified: Secondary | ICD-10-CM | POA: Diagnosis not present

## 2019-01-23 DIAGNOSIS — Z85028 Personal history of other malignant neoplasm of stomach: Secondary | ICD-10-CM | POA: Diagnosis not present

## 2019-01-23 DIAGNOSIS — I130 Hypertensive heart and chronic kidney disease with heart failure and stage 1 through stage 4 chronic kidney disease, or unspecified chronic kidney disease: Secondary | ICD-10-CM | POA: Diagnosis not present

## 2019-01-23 DIAGNOSIS — Z8673 Personal history of transient ischemic attack (TIA), and cerebral infarction without residual deficits: Secondary | ICD-10-CM | POA: Diagnosis not present

## 2019-01-23 DIAGNOSIS — G5791 Unspecified mononeuropathy of right lower limb: Secondary | ICD-10-CM | POA: Diagnosis not present

## 2019-01-23 DIAGNOSIS — Z96651 Presence of right artificial knee joint: Secondary | ICD-10-CM | POA: Diagnosis not present

## 2019-01-23 DIAGNOSIS — E039 Hypothyroidism, unspecified: Secondary | ICD-10-CM | POA: Diagnosis not present

## 2019-01-23 DIAGNOSIS — I480 Paroxysmal atrial fibrillation: Secondary | ICD-10-CM | POA: Diagnosis not present

## 2019-01-23 DIAGNOSIS — Z8553 Personal history of malignant neoplasm of renal pelvis: Secondary | ICD-10-CM | POA: Diagnosis not present

## 2019-01-23 DIAGNOSIS — H6523 Chronic serous otitis media, bilateral: Secondary | ICD-10-CM | POA: Diagnosis not present

## 2019-01-23 DIAGNOSIS — I5033 Acute on chronic diastolic (congestive) heart failure: Secondary | ICD-10-CM | POA: Diagnosis not present

## 2019-01-23 DIAGNOSIS — K219 Gastro-esophageal reflux disease without esophagitis: Secondary | ICD-10-CM | POA: Diagnosis not present

## 2019-01-23 DIAGNOSIS — I272 Pulmonary hypertension, unspecified: Secondary | ICD-10-CM | POA: Diagnosis not present

## 2019-01-23 DIAGNOSIS — G2581 Restless legs syndrome: Secondary | ICD-10-CM | POA: Diagnosis not present

## 2019-01-23 DIAGNOSIS — K3184 Gastroparesis: Secondary | ICD-10-CM | POA: Diagnosis not present

## 2019-01-23 DIAGNOSIS — Z8551 Personal history of malignant neoplasm of bladder: Secondary | ICD-10-CM | POA: Diagnosis not present

## 2019-01-23 DIAGNOSIS — I050 Rheumatic mitral stenosis: Secondary | ICD-10-CM | POA: Diagnosis not present

## 2019-01-23 DIAGNOSIS — Z7901 Long term (current) use of anticoagulants: Secondary | ICD-10-CM | POA: Diagnosis not present

## 2019-01-23 DIAGNOSIS — I2510 Atherosclerotic heart disease of native coronary artery without angina pectoris: Secondary | ICD-10-CM | POA: Diagnosis not present

## 2019-01-23 DIAGNOSIS — Z905 Acquired absence of kidney: Secondary | ICD-10-CM | POA: Diagnosis not present

## 2019-01-23 NOTE — Telephone Encounter (Signed)
VIDEO/ consent/ my chart active/ pre reg completed

## 2019-01-24 ENCOUNTER — Telehealth (INDEPENDENT_AMBULATORY_CARE_PROVIDER_SITE_OTHER): Payer: Medicare Other | Admitting: Cardiology

## 2019-01-24 ENCOUNTER — Telehealth: Payer: Self-pay

## 2019-01-24 ENCOUNTER — Encounter: Payer: Self-pay | Admitting: Cardiology

## 2019-01-24 VITALS — BP 147/72 | HR 62 | Ht 59.0 in | Wt 93.4 lb

## 2019-01-24 DIAGNOSIS — I4821 Permanent atrial fibrillation: Secondary | ICD-10-CM

## 2019-01-24 DIAGNOSIS — Z950 Presence of cardiac pacemaker: Secondary | ICD-10-CM

## 2019-01-24 DIAGNOSIS — I5032 Chronic diastolic (congestive) heart failure: Secondary | ICD-10-CM

## 2019-01-24 NOTE — Patient Instructions (Signed)
Continue your current therapy  Avoid salt and weigh yourself daily  Follow up in 4 months

## 2019-01-24 NOTE — Telephone Encounter (Signed)
Virtual Visit Pre-Appointment Phone Call  "([name]), I am calling you today to discuss your upcoming appointment. We are currently trying to limit exposure to the virus that causes COVID-19 by seeing patients at home rather than in the office."  1. "What is the BEST phone number to call the day of the visit?" - [phone]  2. "Do you have or have access to (through a family member/friend) a smartphone with video capability that we can use for your visit?" a. If yes - list this number in appt notes as "cell" (if different from BEST phone #) and list the appointment type as a VIDEO visit in appointment notes  3. Confirm consent - "In the setting of the current Covid19 crisis, you are scheduled for a ( video) visit with your provider on (May 20) at (9:00 ).  Just as we do with many in-office visits, in order for you to participate in this visit, we must obtain consent.  If you'd like, I can send this to your mychart (if signed up) or email for you to review.  Otherwise, I can obtain your verbal consent now.  All virtual visits are billed to your insurance company just like a normal visit would be.  By agreeing to a virtual visit, we'd like you to understand that the technology does not allow for your provider to perform an examination, and thus may limit your provider's ability to fully assess your condition. If your provider identifies any concerns that need to be evaluated in person, we will make arrangements to do so.  Finally, though the technology is pretty good, we cannot assure that it will always work on either your or our end, and in the setting of a video visit, we may have to convert it to a phone-only visit.  In either situation, we cannot ensure that we have a secure connection.  Are you willing to proceed?" STAFF: Did the patient verbally acknowledge consent to telehealth visit? Document YES/NO here: YES  4. Advise patient to be prepared - "Two hours prior to your appointment,  go ahead and check your blood pressure, pulse, oxygen saturation, and your weight (if you have the equipment to check those) and write them all down. When your visit starts, your provider will ask you for this information. If you have an Apple Watch or Kardia device, please plan to have heart rate information ready on the day of your appointment. Please have a pen and paper handy nearby the day of the visit as well."  5. Give patient instructions for MyChart download to smartphone OR [email or web address] as below if video visit (depending on what platform provider is using)  6. Inform patient they will receive a phone call 15 minutes prior to their appointment time (may be from unknown caller ID) so they should be prepared to answer    TELEPHONE CALL NOTE  [name] has been deemed a candidate for a follow-up tele-health visit to limit community exposure during the Covid-19 pandemic. I spoke with the patient via phone to ensure availability of phone/video source, confirm preferred email & phone number, and discuss instructions and expectations.  I reminded AMEYA VOWELL to be prepared with any vital sign and/or heart rhythm information that could potentially be obtained via home monitoring, at the time of her visit. I reminded SAFIYYA STOKES to expect a phone call prior to her visit.  Rhetta Mura, [place] 01/24/2019 8:33 AM   INSTRUCTIONS FOR DOWNLOADING THE Deloris Ping  APP TO SMARTPHONE  - The patient must first make sure to have activated MyChart and know their login information - If Apple, go to [employer] and type in MyChart in the search bar and download the app. If Android, ask patient to go to [employer] and type in [place] in the search bar and download the app. The app is free but as with any other app downloads, their phone may require them to verify saved payment information or Apple/Android password.  - The patient will need to then log into the app with their MyChart  username and password, and select [place] as their healthcare provider to link the account. When it is time for your visit, go to the MyChart app, find appointments, and click Begin Video Visit. Be sure to Select Allow for your device to access the Microphone and Camera for your visit. You will then be connected, and your provider will be with you shortly.  **If they have any issues connecting, or need assistance please contact MyChart service desk [phone])**  **If using a computer, in order to ensure the best quality for their visit they will need to use either of the following Internet Browsers: [employer], or Google Chrome**  IF USING DOXIMITY or DOXY.ME - The patient will receive a link just prior to their visit by text.     FULL LENGTH CONSENT FOR TELE-HEALTH VISIT   I hereby voluntarily request, consent and authorize [facility] and its employed or contracted physicians, physician assistants, nurse practitioners or other licensed health care professionals (the Practitioner), to provide me with telemedicine health care services (the "Services") as deemed necessary by the treating Practitioner. I acknowledge and consent to receive the Services by the Practitioner via telemedicine. I understand that the telemedicine visit will involve communicating with the Practitioner through live audiovisual communication technology and the disclosure of certain medical information by electronic transmission. I acknowledge that I have been given the opportunity to request an in-person assessment or other available alternative prior to the telemedicine visit and am voluntarily participating in the telemedicine visit.  I understand that I have the right to withhold or withdraw my consent to the use of telemedicine in the course of my care at any time, without affecting my right to future care or treatment, and that the Practitioner or I may terminate the telemedicine visit at any  time. I understand that I have the right to inspect all information obtained and/or recorded in the course of the telemedicine visit and may receive copies of available information for a reasonable fee.  I understand that some of the potential risks of receiving the Services via telemedicine include:  [name] Delay or interruption in medical evaluation due to technological equipment failure or disruption; . Information transmitted may not be sufficient (e.g. poor resolution of images) to allow for appropriate medical decision making by the Practitioner; and/or  . In rare instances, security protocols could fail, causing a breach of personal health information.  Furthermore, I acknowledge that it is my responsibility to provide information about my medical history, conditions and care that is complete and accurate to the best of my ability. I acknowledge that Practitioner's advice, recommendations, and/or decision may be based on factors not within their control, such as incomplete or inaccurate data provided by me or distortions of diagnostic images or specimens that may result from electronic transmissions. I understand that the practice of medicine is not an exact science and that Practitioner makes no  warranties or guarantees regarding treatment outcomes. I acknowledge that I will receive a copy of this consent concurrently upon execution via email to the email address I last provided but may also request a printed copy by calling the office of [place].    I understand that my insurance will be billed for this visit.   I have read or had this consent read to me. . I understand the contents of this consent, which adequately explains the benefits and risks of the Services being provided via telemedicine.  . I have been provided ample opportunity to ask questions regarding this consent and the Services and have had my questions answered to my satisfaction. . I give my informed consent for the services  to be provided through the use of telemedicine in my medical care  By participating in this telemedicine visit I agree to the above.

## 2019-01-26 ENCOUNTER — Telehealth: Payer: Self-pay | Admitting: Adult Health

## 2019-01-26 DIAGNOSIS — H6523 Chronic serous otitis media, bilateral: Secondary | ICD-10-CM | POA: Diagnosis not present

## 2019-01-26 DIAGNOSIS — N189 Chronic kidney disease, unspecified: Secondary | ICD-10-CM | POA: Diagnosis not present

## 2019-01-26 DIAGNOSIS — I2510 Atherosclerotic heart disease of native coronary artery without angina pectoris: Secondary | ICD-10-CM | POA: Diagnosis not present

## 2019-01-26 DIAGNOSIS — I130 Hypertensive heart and chronic kidney disease with heart failure and stage 1 through stage 4 chronic kidney disease, or unspecified chronic kidney disease: Secondary | ICD-10-CM | POA: Diagnosis not present

## 2019-01-26 DIAGNOSIS — I272 Pulmonary hypertension, unspecified: Secondary | ICD-10-CM | POA: Diagnosis not present

## 2019-01-26 DIAGNOSIS — Z950 Presence of cardiac pacemaker: Secondary | ICD-10-CM | POA: Diagnosis not present

## 2019-01-26 DIAGNOSIS — Z8673 Personal history of transient ischemic attack (TIA), and cerebral infarction without residual deficits: Secondary | ICD-10-CM | POA: Diagnosis not present

## 2019-01-26 DIAGNOSIS — Z79899 Other long term (current) drug therapy: Secondary | ICD-10-CM

## 2019-01-26 DIAGNOSIS — E039 Hypothyroidism, unspecified: Secondary | ICD-10-CM | POA: Diagnosis not present

## 2019-01-26 DIAGNOSIS — Z8551 Personal history of malignant neoplasm of bladder: Secondary | ICD-10-CM | POA: Diagnosis not present

## 2019-01-26 DIAGNOSIS — K219 Gastro-esophageal reflux disease without esophagitis: Secondary | ICD-10-CM | POA: Diagnosis not present

## 2019-01-26 DIAGNOSIS — I5033 Acute on chronic diastolic (congestive) heart failure: Secondary | ICD-10-CM | POA: Diagnosis not present

## 2019-01-26 DIAGNOSIS — Z905 Acquired absence of kidney: Secondary | ICD-10-CM | POA: Diagnosis not present

## 2019-01-26 DIAGNOSIS — K3184 Gastroparesis: Secondary | ICD-10-CM | POA: Diagnosis not present

## 2019-01-26 DIAGNOSIS — G5791 Unspecified mononeuropathy of right lower limb: Secondary | ICD-10-CM | POA: Diagnosis not present

## 2019-01-26 DIAGNOSIS — G2581 Restless legs syndrome: Secondary | ICD-10-CM | POA: Diagnosis not present

## 2019-01-26 DIAGNOSIS — Z96651 Presence of right artificial knee joint: Secondary | ICD-10-CM | POA: Diagnosis not present

## 2019-01-26 DIAGNOSIS — I480 Paroxysmal atrial fibrillation: Secondary | ICD-10-CM | POA: Diagnosis not present

## 2019-01-26 DIAGNOSIS — Z85028 Personal history of other malignant neoplasm of stomach: Secondary | ICD-10-CM | POA: Diagnosis not present

## 2019-01-26 DIAGNOSIS — Z7901 Long term (current) use of anticoagulants: Secondary | ICD-10-CM | POA: Diagnosis not present

## 2019-01-26 DIAGNOSIS — Z8553 Personal history of malignant neoplasm of renal pelvis: Secondary | ICD-10-CM | POA: Diagnosis not present

## 2019-01-26 DIAGNOSIS — I050 Rheumatic mitral stenosis: Secondary | ICD-10-CM | POA: Diagnosis not present

## 2019-01-26 NOTE — Telephone Encounter (Signed)
New message   Patient's daughter wants to know if she can have blood work done at Dr. Gerri Spore office on Tuesday. Please advise what blood work the patient needs to have done.

## 2019-01-26 NOTE — Telephone Encounter (Signed)
Spoke with Brenda-all questions answered she will have PCP fax results if their lab is not [facility]

## 2019-01-29 ENCOUNTER — Encounter: Payer: Self-pay | Admitting: Internal Medicine

## 2019-01-29 DIAGNOSIS — R7309 Other abnormal glucose: Secondary | ICD-10-CM | POA: Insufficient documentation

## 2019-01-29 NOTE — Patient Instructions (Addendum)
- Vit D  And Vit C 1,000 mg  are recommended to help protect  against the Covid_19 and other Corona viruses.   - Also it's recommended to take Zinc 50 mg to help  protect against the Covid_19  And best place to get  is also on [employer] and don't pay more than 6-8 cents /pill !   ================================ Coronavirus (COVID-19) Are you at risk?  Are you at risk for the Coronavirus (COVID-19)?  To be considered HIGH RISK for Coronavirus (COVID-19), you have to meet the following criteria:  . Traveled to [place], [place], [place], [place] or [place]; or in the [place] to [place], [place], [place]  . or [place]; and have fever, cough, and shortness of breath within the last 2 weeks of travel OR . Been in close contact with a person diagnosed with COVID-19 within the last 2 weeks and have  . fever, cough,and shortness of breath .  . IF YOU DO NOT MEET THESE CRITERIA, YOU ARE CONSIDERED LOW RISK FOR COVID-19.  What to do if you are HIGH RISK for COVID-19?  [name] If you are having a medical emergency, call 911. . Seek medical care right away. Before you go to a doctor's office, urgent care or emergency department, .  call ahead and tell them about your recent travel, contact with someone diagnosed with COVID-19  .  and your symptoms.  . You should receive instructions from your physician's office regarding next steps of care.  . When you arrive at healthcare provider, tell the healthcare staff immediately you have returned from  . visiting [place], [place], [place], [place] or [place]; or traveled in the [place] to [place], [place],  . [place] or [place] in the last two weeks or you have been in close contact with a person diagnosed with  . COVID-19 in the last 2 weeks.   . Tell the health care staff about your symptoms: fever, cough and shortness of breath. . After you have been seen by a medical provider, you will be either: o Tested for (COVID-19) and  discharged home on quarantine except to seek medical care if  o symptoms worsen, and asked to  - Stay home and avoid contact with others until you get your results (4-5 days)  - Avoid travel on public transportation if possible (such as bus, train, or airplane) or o Sent to the Emergency Department by EMS for evaluation, COVID-19 testing  and  o possible admission depending on your condition and test results.  What to do if you are LOW RISK for COVID-19?  Reduce your risk of any infection by using the same precautions used for avoiding the common cold or flu:  [name] Wash your hands often with soap and warm water for at least 20 seconds.  If soap and water are not readily available,  . use an alcohol-based hand sanitizer with at least 60% alcohol.  . If coughing or sneezing, cover your mouth and nose by coughing or sneezing into the elbow areas of your shirt or coat, .  into a tissue or into your sleeve (not your hands). . Avoid shaking hands with others and consider head nods or verbal greetings only. . Avoid touching your eyes, nose, or mouth with unwashed hands.  . Avoid close contact with people who are sick. . Avoid places or events with large numbers of people in one location, like concerts or sporting events. . Carefully consider travel plans  you have or are making. . If you are planning any travel outside or inside the [place], visit the CDC's Travelers' Health webpage for the latest health notices. . If you have some symptoms but not all symptoms, continue to monitor at home and seek medical attention  . if your symptoms worsen. . If you are having a medical emergency, call 911.   . >>>>>>>>>>>>>>>>>>>>>>>>>>>>>>>>> . We Do NOT Approve of  [facility] [place] Soliciting Our Patients  To Do Home Visits & We Do NOT Approve of [employer] > > > > > > > > > > > > > > > > > > > > > > > > > > > > > > > > > > > > > > >  Preventive Care for Adults  A healthy lifestyle  and preventive care can promote health and wellness. Preventive health guidelines for women include the following key practices.  A routine yearly physical is a good way to check with your health care provider about your health and preventive screening. It is a chance to share any concerns and updates on your health and to receive a thorough exam.  Visit your dentist for a routine exam and preventive care every 6 months. Brush your teeth twice a day and floss once a day. Good oral hygiene prevents tooth decay and gum disease.  The frequency of eye exams is based on your age, health, family medical history, use of contact lenses, and other factors. Follow your health care provider's recommendations for frequency of eye exams.  Eat a healthy diet. Foods like vegetables, fruits, whole grains, low-fat dairy products, and lean protein foods contain the nutrients you need without too many calories. Decrease your intake of foods high in solid fats, added sugars, and salt. Eat the right amount of calories for you. Get information about a proper diet from your health care provider, if necessary.  Regular physical exercise is one of the most important things you can do for your health. Most adults should get at least 150 minutes of moderate-intensity exercise (any activity that increases your heart rate and causes you to sweat) each week. In addition, most adults need muscle-strengthening exercises on 2 or more days a week.  Maintain a healthy weight. The body mass index (BMI) is a screening tool to identify possible weight problems. It provides an estimate of body fat based on height and weight. Your health care provider can find your BMI and can help you achieve or maintain a healthy weight. For adults 20 years and older:  A BMI below 18.5 is considered underweight.  A BMI of 18.5 to 24.9 is normal.  A BMI of 25 to 29.9 is considered overweight.  A BMI of 30 and above is considered obese.  Maintain  normal blood lipids and cholesterol levels by exercising and minimizing your intake of saturated fat. Eat a balanced diet with plenty of fruit and vegetables. If your lipid or cholesterol levels are high, you are over 50, or you are at high risk for heart disease, you may need your cholesterol levels checked more frequently. Ongoing high lipid and cholesterol levels should be treated with medicines if diet and exercise are not working.  If you smoke, find out from your health care provider how to quit. If you do not use tobacco, do not start.  Lung cancer screening is recommended for adults aged 55-80 years who are at high risk for developing lung cancer because of a history   of smoking. A yearly low-dose CT scan of the lungs is recommended for people who have at least a 30-pack-year history of smoking and are a current smoker or have quit within the past 15 years. A pack year of smoking is smoking an average of 1 pack of cigarettes a day for 1 year (for example: 1 pack a day for 30 years or 2 packs a day for 15 years). Yearly screening should continue until the smoker has stopped smoking for at least 15 years. Yearly screening should be stopped for people who develop a health problem that would prevent them from having lung cancer treatment.  Avoid use of street drugs. Do not share needles with anyone. Ask for help if you need support or instructions about stopping the use of drugs.  High blood pressure causes heart disease and increases the risk of stroke.  Ongoing high blood pressure should be treated with medicines if weight loss and exercise do not work.  If you are 27-58 years old, ask your health care provider if you should take aspirin to prevent strokes.  Diabetes screening involves taking a blood sample to check your fasting blood sugar level. This should be done once every 3 years, after age 73, if you are within normal weight and without risk factors for diabetes. Testing should be considered  at a younger age or be carried out more frequently if you are overweight and have at least 1 risk factor for diabetes.  Breast cancer screening is essential preventive care for women. You should practice "breast self-awareness." This means understanding the normal appearance and feel of your breasts and may include breast self-examination. Any changes detected, no matter how small, should be reported to a health care provider. Women in their 43s and 30s should have a clinical breast exam (CBE) by a health care provider as part of a regular health exam every 1 to 3 years. After age 4, women should have a CBE every year. Starting at age 77, women should consider having a mammogram (breast X-ray test) every year. Women who have a family history of breast cancer should talk to their health care provider about genetic screening. Women at a high risk of breast cancer should talk to their health care providers about having an MRI and a mammogram every year.  Breast cancer gene (BRCA)-related cancer risk assessment is recommended for women who have family members with BRCA-related cancers. BRCA-related cancers include breast, ovarian, tubal, and peritoneal cancers. Having family members with these cancers may be associated with an increased risk for harmful changes (mutations) in the breast cancer genes BRCA1 and BRCA2. Results of the assessment will determine the need for genetic counseling and BRCA1 and BRCA2 testing.  Routine pelvic exams to screen for cancer are no longer recommended for nonpregnant women who are considered low risk for cancer of the pelvic organs (ovaries, uterus, and vagina) and who do not have symptoms. Ask your health care provider if a screening pelvic exam is right for you.  If you have had past treatment for cervical cancer or a condition that could lead to cancer, you need Pap tests and screening for cancer for at least 20 years after your treatment. If Pap tests have been discontinued,  your risk factors (such as having a new sexual partner) need to be reassessed to determine if screening should be resumed. Some women have medical problems that increase the chance of getting cervical cancer. In these cases, your health care provider may recommend more  frequent screening and Pap tests.    Colorectal cancer can be detected and often prevented. Most routine colorectal cancer screening begins at the age of 50 years and continues through age 75 years. However, your health care provider may recommend screening at an earlier age if you have risk factors for colon cancer. On a yearly basis, your health care provider may provide home test kits to check for hidden blood in the stool. Use of a small camera at the end of a tube, to directly examine the colon (sigmoidoscopy or colonoscopy), can detect the earliest forms of colorectal cancer. Talk to your health care provider about this at age 50, when routine screening begins.  Direct exam of the colon should be repeated every 5-10 years through age 75 years, unless early forms of pre-cancerous polyps or small growths are found.  Osteoporosis is a disease in which the bones lose minerals and strength with aging. This can result in serious bone fractures or breaks. The risk of osteoporosis can be identified using a bone density scan. Women ages 65 years and over and women at risk for fractures or osteoporosis should discuss screening with their health care providers. Ask your health care provider whether you should take a calcium supplement or vitamin D to reduce the rate of osteoporosis.  Menopause can be associated with physical symptoms and risks. Hormone replacement therapy is available to decrease symptoms and risks. You should talk to your health care provider about whether hormone replacement therapy is right for you.  Use sunscreen. Apply sunscreen liberally and repeatedly throughout the day. You should seek shade when your shadow is shorter  than you. Protect yourself by wearing long sleeves, pants, a wide-brimmed hat, and sunglasses year round, whenever you are outdoors.  Once a month, do a whole body skin exam, using a mirror to look at the skin on your back. Tell your health care provider of new moles, moles that have irregular borders, moles that are larger than a pencil eraser, or moles that have changed in shape or color.  Stay current with required vaccines (immunizations).  Influenza vaccine. All adults should be immunized every year.  Tetanus, diphtheria, and acellular pertussis (Td, Tdap) vaccine. Pregnant women should receive 1 dose of Tdap vaccine during each pregnancy. The dose should be obtained regardless of the length of time since the last dose. Immunization is preferred during the 27th-36th week of gestation. An adult who has not previously received Tdap or who does not know her vaccine status should receive 1 dose of Tdap. This initial dose should be followed by tetanus and diphtheria toxoids (Td) booster doses every 10 years. Adults with an unknown or incomplete history of completing a 3-dose immunization series with Td-containing vaccines should begin or complete a primary immunization series including a Tdap dose. Adults should receive a Td booster every 10 years.    Zoster vaccine. One dose is recommended for adults aged 60 years or older unless certain conditions are present.    Pneumococcal 13-valent conjugate (PCV13) vaccine. When indicated, a person who is uncertain of her immunization history and has no record of immunization should receive the PCV13 vaccine. An adult aged 19 years or older who has certain medical conditions and has not been previously immunized should receive 1 dose of PCV13 vaccine. This PCV13 should be followed with a dose of pneumococcal polysaccharide (PPSV23) vaccine. The PPSV23 vaccine dose should be obtained at least 1 or more year(s) after the dose of PCV13 vaccine. An adult   aged 24  years or older who has certain medical conditions and previously received 1 or more doses of PPSV23 vaccine should receive 1 dose of PCV13. The PCV13 vaccine dose should be obtained 1 or more years after the last PPSV23 vaccine dose.    Pneumococcal polysaccharide (PPSV23) vaccine. When PCV13 is also indicated, PCV13 should be obtained first. All adults aged 74 years and older should be immunized. An adult younger than age 85 years who has certain medical conditions should be immunized. Any person who resides in a nursing home or long-term care facility should be immunized. An adult smoker should be immunized. People with an immunocompromised condition and certain other conditions should receive both PCV13 and PPSV23 vaccines. People with human immunodeficiency virus (HIV) infection should be immunized as soon as possible after diagnosis. Immunization during chemotherapy or radiation therapy should be avoided. Routine use of PPSV23 vaccine is not recommended for American Indians, [place] Natives, or people younger than 65 years unless there are medical conditions that require PPSV23 vaccine. When indicated, people who have unknown immunization and have no record of immunization should receive PPSV23 vaccine. One-time revaccination 5 years after the first dose of PPSV23 is recommended for people aged 19-64 years who have chronic kidney failure, nephrotic syndrome, asplenia, or immunocompromised conditions. People who received 1-2 doses of PPSV23 before age 10 years should receive another dose of PPSV23 vaccine at age 31 years or later if at least 5 years have passed since the previous dose. Doses of PPSV23 are not needed for people immunized with PPSV23 at or after age 76 years.   Preventive Services / Frequency  Ages 30 years and over  Blood pressure check.  Lipid and cholesterol check.  Lung cancer screening. / Every year if you are aged 72-80 years and have a 30-pack-year history of smoking and  currently smoke or have quit within the past 15 years. Yearly screening is stopped once you have quit smoking for at least 15 years or develop a health problem that would prevent you from having lung cancer treatment.  Clinical breast exam.** / Every year after age 25 years.   BRCA-related cancer risk assessment.** / For women who have family members with a BRCA-related cancer (breast, ovarian, tubal, or peritoneal cancers).  Mammogram.** / Every year beginning at age 29 years and continuing for as long as you are in good health. Consult with your health care provider.  Pap test.** / Every 3 years starting at age 79 years through age 74 or 44 years with 3 consecutive normal Pap tests. Testing can be stopped between 65 and 70 years with 3 consecutive normal Pap tests and no abnormal Pap or HPV tests in the past 10 years.  Fecal occult blood test (FOBT) of stool. / Every year beginning at age 43 years and continuing until age 42 years. You may not need to do this test if you get a colonoscopy every 10 years.  Flexible sigmoidoscopy or colonoscopy.** / Every 5 years for a flexible sigmoidoscopy or every 10 years for a colonoscopy beginning at age 34 years and continuing until age 35 years.  Hepatitis C blood test.** / For all people born from [birth year] through 1963 and any individual with known risks for hepatitis C.  Osteoporosis screening.** / A one-time screening for women ages 58 years and over and women at risk for fractures or osteoporosis.  Skin self-exam. / Monthly.  Influenza vaccine. / Every year.  Tetanus, diphtheria, and acellular pertussis (Tdap/Td) vaccine.** /  1 dose of Td every 10 years.  Zoster vaccine.** / 1 dose for adults aged 60 years or older.  Pneumococcal 13-valent conjugate (PCV13) vaccine.** / Consult your health care provider.  Pneumococcal polysaccharide (PPSV23) vaccine.** / 1 dose for all adults aged 65 years and older. Screening for abdominal aortic aneurysm (AAA)   by ultrasound is recommended for people who have history of high blood pressure or who are current or former smokers. ++++++++++++++++++++ Recommend Adult Low Dose Aspirin or  coated  Aspirin 81 mg daily  To reduce risk of Colon Cancer 40 %,  Skin Cancer 26 % ,  Melanoma 46%  and  Pancreatic cancer 60% ++++++++++++++++++++ Vitamin D goal  is between 70-100.  Please make sure that you are taking your Vitamin D as directed.  It is very important as a natural anti-inflammatory  helping hair, skin, and nails, as well as reducing stroke and heart attack risk.  It helps your bones and helps with mood. It also decreases numerous cancer risks so please take it as directed.  Low Vit D is associated with a 200-300% higher risk for CANCER  and 200-300% higher risk for HEART   ATTACK  &  STROKE.   ...................................... It is also associated with higher death rate at younger ages,  autoimmune diseases like Rheumatoid arthritis, Lupus, Multiple Sclerosis.    Also many other serious conditions, like depression, Alzheimer's Dementia, infertility, muscle aches, fatigue, fibromyalgia - just to name a few. ++++++++++++++++++ Recommend the book "The END of DIETING" by Dr Joel Fuhrman  & the book "The END of DIABETES " by Dr Joel Fuhrman At [employer] - get book & Audio CD's    Being diabetic has a  300% increased risk for heart attack, stroke, cancer, and alzheimer- type vascular dementia. It is very important that you work harder with diet by avoiding all foods that are white. Avoid white rice (brown & wild rice is OK), white potatoes (sweetpotatoes in moderation is OK), White bread or wheat bread or anything made out of white flour like bagels, donuts, rolls, buns, biscuits, cakes, pastries, cookies, pizza crust, and pasta (made from white flour & egg whites) - vegetarian pasta or spinach or wheat pasta is OK. Multigrain breads like Arnold's or [employer], or multigrain sandwich thins  or flatbreads.  Diet, exercise and weight loss can reverse and cure diabetes in the early stages.  Diet, exercise and weight loss is very important in the control and prevention of complications of diabetes which affects every system in your body, ie. Brain - dementia/stroke, eyes - glaucoma/blindness, heart - heart attack/heart failure, kidneys - dialysis, stomach - gastric paralysis, intestines - malabsorption, nerves - severe painful neuritis, circulation - gangrene & loss of a leg(s), and finally cancer and Alzheimers.    I recommend avoid fried & greasy foods,  sweets/candy, white rice (brown or wild rice or Quinoa is OK), white potatoes (sweet potatoes are OK) - anything made from white flour - bagels, doughnuts, rolls, buns, biscuits,white and wheat breads, pizza crust and traditional pasta made of white flour & egg white(vegetarian pasta or spinach or wheat pasta is OK).  Multi-grain bread is OK - like multi-grain flat bread or sandwich thins. Avoid alcohol in excess. Exercise is also important.    Eat all the vegetables you want - avoid meat, especially red meat and dairy - especially cheese.  Cheese is the most concentrated form of trans-fats which is the worst thing to clog up our arteries. Veggie   cheese is OK which can be found in the fresh produce section at [employer] or [employer] or [employer]  +++++++++++++++++++ DASH Eating Plan  DASH stands for "Dietary Approaches to Stop Hypertension."   The DASH eating plan is a healthy eating plan that has been shown to reduce high blood pressure (hypertension). Additional health benefits may include reducing the risk of type 2 diabetes mellitus, heart disease, and stroke. The DASH eating plan may also help with weight loss. WHAT DO I NEED TO KNOW ABOUT THE DASH EATING PLAN? For the DASH eating plan, you will follow these general guidelines:  Choose foods with a percent daily value for sodium of less than 5% (as listed on the food  label).  Use salt-free seasonings or herbs instead of table salt or sea salt.  Check with your health care provider or pharmacist before using salt substitutes.  Eat lower-sodium products, often labeled as "lower sodium" or "no salt added."  Eat fresh foods.  Eat more vegetables, fruits, and low-fat dairy products.  Choose whole grains. Look for the word "whole" as the first word in the ingredient list.  Choose fish   Limit sweets, desserts, sugars, and sugary drinks.  Choose heart-healthy fats.  Eat veggie cheese   Eat more home-cooked food and less restaurant, buffet, and fast food.  Limit fried foods.  Cook foods using methods other than frying.  Limit canned vegetables. If you do use them, rinse them well to decrease the sodium.  When eating at a restaurant, ask that your food be prepared with less salt, or no salt if possible.                      WHAT FOODS CAN I EAT? Read Dr Fara Olden Fuhrman's books on The End of Dieting & The End of Diabetes  Grains Whole grain or whole wheat bread. Brown rice. Whole grain or whole wheat pasta. Quinoa, bulgur, and whole grain cereals. Low-sodium cereals. Corn or whole wheat flour tortillas. Whole grain cornbread. Whole grain crackers. Low-sodium crackers.  Vegetables Fresh or frozen vegetables (raw, steamed, roasted, or grilled). Low-sodium or reduced-sodium tomato and vegetable juices. Low-sodium or reduced-sodium tomato sauce and paste. Low-sodium or reduced-sodium canned vegetables.   Fruits All fresh, canned (in natural juice), or frozen fruits.  Protein Products  All fish and seafood.  Dried beans, peas, or lentils. Unsalted nuts and seeds. Unsalted canned beans.  Dairy Low-fat dairy products, such as skim or 1% milk, 2% or reduced-fat cheeses, low-fat ricotta or cottage cheese, or plain low-fat yogurt. Low-sodium or reduced-sodium cheeses.  Fats and Oils Tub margarines without trans fats. Light or reduced-fat mayonnaise  and salad dressings (reduced sodium). Avocado. Safflower, olive, or canola oils. Natural peanut or almond butter.  Other Unsalted popcorn and pretzels. The items listed above may not be a complete list of recommended foods or beverages. Contact your dietitian for more options.  +++++++++++++++  WHAT FOODS ARE NOT RECOMMENDED? Grains/ White flour or wheat flour White bread. White pasta. White rice. Refined cornbread. Bagels and croissants. Crackers that contain trans fat.  Vegetables  Creamed or fried vegetables. Vegetables in a . Regular canned vegetables. Regular canned tomato sauce and paste. Regular tomato and vegetable juices.  Fruits Dried fruits. Canned fruit in light or heavy syrup. Fruit juice.  Meat and Other Protein Products Meat in general - RED meat & White meat.  Fatty cuts of meat. Ribs, chicken wings, all processed meats as bacon, sausage, bologna, salami,  fatback, hot dogs, bratwurst and packaged luncheon meats.  Dairy Whole or 2% milk, cream, half-and-half, and cream cheese. Whole-fat or sweetened yogurt. Full-fat cheeses or blue cheese. Non-dairy creamers and whipped toppings. Processed cheese, cheese spreads, or cheese curds.  Condiments Onion and garlic salt, seasoned salt, table salt, and sea salt. Canned and packaged gravies. Worcestershire sauce. Tartar sauce. Barbecue sauce. Teriyaki sauce. Soy sauce, including reduced sodium. Steak sauce. Fish sauce. Oyster sauce. Cocktail sauce. Horseradish. Ketchup and mustard. Meat flavorings and tenderizers. Bouillon cubes. Hot sauce. Tabasco sauce. Marinades. Taco seasonings. Relishes.  Fats and Oils Butter, stick margarine, lard, shortening and bacon fat. Coconut, palm kernel, or palm oils. Regular salad dressings.  Pickles and olives. Salted popcorn and pretzels.  The items listed above may not be a complete list of foods and beverages to avoid.  ++++++++++++++++++++++++++++++++++++++++++++ Bleeding Precautions When  on Anticoagulant Therapy  Anticoagulant therapy, also called blood thinner therapy, is medicine that helps to prevent and treat blood clots. The medicine works by stopping blood clots from forming or growing. Blood clots that form in your blood vessels can be dangerous. They can break loose and travel to the heart, lungs, or brain. This increases the risk of a heart attack, stroke, or blocked lung artery (pulmonary embolism). Anticoagulants also increase the risk of bleeding. Try to protect yourself from cuts and other injuries that can cause bleeding. It is important to take anticoagulants exactly as told by your health care provider. Why do I need to be on anticoagulant therapy? You may need this medicine if you are at risk of developing a blood clot. Conditions that increase your risk of a blood clot include:  Being born with heart disease or a heart malformation (congenital heart disease).  Developing heart disease.  Having had surgery, such as valve replacement.  Having had a serious accident or other type of severe injury (trauma).  Having certain types of cancer.  Having certain diseases that can increase blood clotting.  Having a high risk of stroke or heart attack.  Having atrial fibrillation (AF). What are the common anticoagulant medicines? There are several types of anticoagulant medicines. The most common types are:  Medicines that you take by mouth (oral medicines), such as: ? Warfarin. ? Novel oral anticoagulants (NOACs), such as: ? Direct thrombin inhibitors (dabigatran). ? Factor Xa inhibitors (apixaban, edoxaban, and rivaroxaban).  Injections, such as: ? Unfractionated heparin. ? Low molecular weight heparin. These anticoagulants work in different ways to prevent blood clots. They also have different risks and side effects. What do I need to remember while on anticoagulant therapy? Taking anticoagulants  Take your medicine at the same time every day. If you  forget to take your medicine, take it as soon as you remember. Do not double your dosage of medicine if you miss a whole day. Take your normal dose and call your health care provider.  Do not stop taking your medicine unless your health care provider approves. Stopping the medicine can increase your risk of developing a blood clot. Taking other medicines  Take over-the-counter and prescriptions medicines only as told by your health care provider.  Do not take over-the-counter NSAIDs, including aspirin and ibuprofen, while you are on anticoagulant therapy. These medicines increase your risk of dangerous bleeding.  Get approval from your health care provider before you start taking any new medicines, vitamins, or herbal products. Some of these could interfere with your therapy. General instructions  Keep all follow-up visits as told by your health  care provider. This is important.  If you are pregnant or trying to get pregnant, talk with a health care provider about anticoagulants. Some of these medicines are not safe to take during pregnancy.  Tell all health care providers, including your dentist, that you are on anticoagulant therapy. It is especially important to tell providers before you have any surgery, medical procedures, or dental work done. What precautions should I take?   Be very careful when using knives, scissors, or other sharp objects.  Use an electric razor instead of a blade.  Do not use toothpicks.  Use a soft-bristled toothbrush. Brush your teeth gently.  Always wear shoes outdoors and wear slippers indoors.  Be careful when cutting your fingernails and toenails.  Place bath mats in the bathroom. If possible, install handrails as well.  Wear gloves while you do yard work.  Wear your seat belt.  Prevent falls by removing loose rugs and extension cords from areas where you walk. Use a cane or walker if you need it.  Avoid constipation by: ? Drinking enough  fluid to keep your urine clear or pale yellow. ? Eating foods that are high in fiber, such as fresh fruits and vegetables, whole grains, and beans. ? Limiting foods that are high in fat and processed sugars, such as fried and sweet foods.  Do not play contact sports or participate in other activities that have a high risk for injury. What other precautions are important if on warfarin therapy? If you are taking a type of anticoagulant called warfarin, make sure you:  Work with a diet and nutrition specialist (dietitian) to make an eating plan. Do not make any sudden changes to your diet after you have started your eating plan.  Do not drink alcohol. It can interfere with your medicine and increase your risk of an injury that causes bleeding.  Get regular blood tests as told by your health care provider. What are some questions to ask my health care provider?  Why do I need anticoagulant therapy?  What is the best anticoagulant therapy for my condition?  How long will I need anticoagulant therapy?  What are the side effects of anticoagulant therapy?  When should I take my medicine? What should I do if I forget to take it?  Will I need to have regular blood tests?  Do I need to change my diet? Are there foods or drinks that I should avoid?  What activities are safe for me?  What should I do if I want to get pregnant? Contact a health care provider if:  You miss a dose of medicine: ? And you are not sure what to do. ? For more than one day.  You have: ? Menstrual bleeding that is heavier than normal. ? Bloody or brown urine. ? Easy bruising. ? Black and tarry stool or bright red stool. ? Side effects from your medicine.  You feel weak or dizzy.  You become pregnant. Get help right away if:  You have bleeding that will not stop within 20 minutes from: ? The nose. ? The gums. ? A cut on the skin.  You have a severe headache or stomachache.  You vomit or cough up  blood.  You fall or hit your head. Summary  Anticoagulant therapy, also called blood thinner therapy, is medicine that helps to prevent and treat blood clots.  Anticoagulants work in different ways to prevent blood clots. They also have different risks and side effects.  Talk with  your health care provider about any precautions that you should take while on anticoagulant therapy. This information is not intended to replace advice given to you by your health care provider. Make sure you discuss any questions you have with your health care provider. Document Released: 08/04/2015 Document Revised: 11/09/2016 Document Reviewed: 11/09/2016 [employer] Interactive Patient Education  2019 [name].

## 2019-01-29 NOTE — Progress Notes (Signed)
[facility] [name], M.D.     Uvaldo Bristle. Silverio Lay, P.A.-C [name], [place] [address] [place], [place] [address] Telephone [phone] Telefax [phone] Annual Screening/Preventative Visit & Comprehensive Evaluation &  Examination  History of Present Illness:     This very nice 85 y.o. WWF presents for a Screening /Preventative Visit & comprehensive evaluation and management of multiple medical co-morbidities.  Patient has been followed for HTN, pAfib, HLD, Prediabetes  and Vitamin D Deficiency. Patient is accompanied by her caretaker daughterHassan Rowan.       Patient also has hx/o Uterine Ca (1994),  Transitional Cell Kidney Ca (2003), Gastric Ca (2017) resected in 2017 by Roux-en-Y and most recently a Bladder Ca (2019) & has completed BCG Bladder Irrigations and had local tumor ressection.       HTN predates circa 1960's. She was dx'd in 2008 with Afib and has been on Coumadin followed [facility] In 2011 she had a PPM implanted for SSS.  She had a very remote repair of a Patent Ductus Arterious. She has hx/o Moderate AoS and compensated CHF followed by Dr [name].  hx/o She also has hx/o Patient's BP has been controlled at home and patient denies any cardiac symptoms as chest pain, palpitations, shortness of breath, dizziness, daughter has concerns re: increased ankle swelling  (Rt>>Lt). Today's BP is elevated at 168/68.     Patient's hyperlipidemia is controlled with diet and medications. Patient denies myalgias or other medication SE's. Last lipids were at goal: Lab Results  Component Value Date   CHOL 143 10/30/2018   HDL 43 (L) 10/30/2018   LDLCALC 76 10/30/2018   TRIG 143 10/30/2018   CHOLHDL 3.3 10/30/2018      Patient has been monitored for hx/o prediabetes / Insulin Resistance predating since 2012 and patient denies reactive hypoglycemic symptoms, visual blurring,  diabetic polys or paresthesias. Last A1c was Normal & at goal: Lab Results  Component Value Date   HGBA1C 5.0 07/24/2018      Finally, patient has history of Vitamin D Deficiency ("19" / 2008)  and last Vitamin D was low: Lab Results  Component Value Date   VD25OH 46 07/24/2018   Current Outpatient Medications on File Prior to Visit  Medication Sig  . acetaminophen (TYLENOL) 500 MG tablet Take 500-1,000 mg by mouth every 6 (six) hours as needed for mild pain or headache.   . Biotin 10 MG CAPS Take 10 mg by mouth daily.   . cetirizine (ZYRTEC) 10 MG tablet Take 10 mg by mouth daily.  . Cholecalciferol (VITAMIN D-3) 5000 units TABS Take 5,000 Units by mouth 2 (two) times a day.   . famotidine (PEPCID) 40 MG tablet Take 1 tablet (40 mg total) by mouth daily.  . furosemide (LASIX) 20 MG tablet Take 1 tablet (20 mg total) by mouth daily as needed (For weight gain of greater than 3 lbs).  . gabapentin (NEURONTIN) 300 MG capsule Take 1 capsule by mouth 2 (two) times a day.  . levothyroxine (SYNTHROID, LEVOTHROID) 100 MCG tablet TAKE 1/2 TABLET BY MOUTH DAILY BEFORE BREAKFAST.  [name] losartan (COZAAR) 25 MG tablet Take 2 tablets (50 mg total) by mouth daily.  . metoprolol tartrate (LOPRESSOR) 100 MG tablet Take 0.5 tablets (50 mg total) by mouth 2 (two) times daily.  . nitroGLYCERIN (NITROSTAT) 0.4 MG SL tablet Place 1 tablet (0.4 mg total) under the tongue every 5 (five) minutes as  needed for chest pain (MAX 3 TABLETS).  . ondansetron (ZOFRAN-ODT) 8 MG disintegrating tablet Take 8 mg by mouth every 8 (eight) hours as needed for nausea or vomiting.   . simethicone (MYLICON) 80 MG chewable tablet Chew 80 mg by mouth every 6 (six) hours as needed for flatulence.  . sucralfate (CARAFATE) 1 g tablet TAKE 1 TABLET (1 G TOTAL) BY MOUTH 4 (FOUR) TIMES DAILY - WITH MEALS AND AT BEDTIME. (Patient taking differently: Take 1 g by mouth 3 (three) times daily. )  . warfarin (COUMADIN) 2 MG tablet TAKE 1 TABLET BY  MOUTH DAILY OR AS DIRECTED (Patient taking differently: TAKE 1 TABLET BY MOUTH DAILY OR AS DIRECTED)   No current facility-administered medications on file prior to visit.    Allergies  Allergen Reactions  . Adhesive [Tape] Other (See Comments)    Tears skin, Please use "paper" tape  . Amiodarone Other (See Comments)     Thyroid and liver and kidney problems  . Hydrocodone Other (See Comments)    HALLLUCINATIONS  . Remeron [Mirtazapine] Other (See Comments)    Hallucinations and make pt loopy  . Codeine Nausea And Vomiting  . Morphine And Related Nausea And Vomiting  . Reglan [Metoclopramide] Hives, Itching and Rash  . Requip [Ropinirole Hcl] Other (See Comments)    Headache   . Zinc Nausea Only  . Other     NO BLOOD PRODUCTS   Past Medical History:  Diagnosis Date  . Adenocarcinoma of stomach ([place]) 11/11/2015   gastric mass on egd  . Anal fissure   . Anemia    hx 3/17 iron infusion, on aranesp and injected B12 since 11/2015.   [name] Anxiety   . Aortic root dilatation ([place])   . Arthritis    oa  . 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 CAD (coronary artery disease)   . Chronic kidney disease    only has one kidney rt lft rem 03 ca     dr. Risa Grill, bladder cancer 05-04-2018  . Elevated LFTs    hepatic steatosis, none recent  . Gastroparesis   . GERD (gastroesophageal reflux disease)   . History of uterine cancer 1980s   treated with hysterectomy, external radiation and radiation seed implants.   [name] HTN (hypertension)   . Hyperlipemia   . Hypothyroidism   . Iron deficiency anemia due to chronic blood loss 11/24/2015  . LVH (left ventricular hypertrophy) 11/14/2015   Mild, noted on ECHO  . Moderate aortic stenosis   . Neuropathy    right leg  . No blood products 02/27/2016  . Osteomyelitis ([place]) as child  . PAF (paroxysmal atrial fibrillation) ([place])   . PONV (postoperative nausea and vomiting)    long time ago  . Presence of permanent cardiac pacemaker   . Radiation proctitis   .  Restless leg syndrome   . Stroke ([place]) 15   partial stroke left legally blind; left eye   . Tachycardia-bradycardia syndrome [facility])    s/p PPM by Dr Doreatha Lew (MDT) 07/03/10   Health Maintenance  Topic Date Due  . DEXA SCAN  02/11/2001  . INFLUENZA VACCINE  04/07/2019  . COLONOSCOPY  11/10/2020  . TETANUS/TDAP  08/11/2026  . PNA vac Low Risk Adult  Completed   Immunization History  Administered Date(s) Administered  . Influenza, High Dose Seasonal PF 07/18/2015, 06/23/2017, 06/27/2018  . Influenza,inj,Quad PF,6+ Mos 05/31/2014, 06/09/2016  . Influenza-Unspecified 09/16/2009, 07/03/2013  . Pneumococcal Conjugate-13 09/06/2002  . Pneumococcal Polysaccharide-23 07/18/2015  .  Td 09/06/2002  . Zoster 09/06/2008   Last Colon -   Last MGM -   Past Surgical History:  Procedure Laterality Date  . CARDIAC CATHETERIZATION  2008  . CHOLECYSTECTOMY  1970s  . COLONOSCOPY N/A 11/11/2015   Procedure: COLONOSCOPY;  Surgeon: [name], MD;  Location: [facility];  Service: Endoscopy;  Laterality: N/A;  . CYSTOSCOPY W/ RETROGRADES Right 05/01/2018   Procedure: CYSTOSCOPY WITH RIGHT RETROGRADE PYELOGRAM;  Surgeon: Lucas Mallow, MD;  Location: [facility];  Service: Urology;  Laterality: Right;  . CYSTOSCOPY W/ RETROGRADES Right 07/21/2018   Procedure: CYSTOSCOPY WITH RIGHT RETROGRADE PYELOGRAM;  Surgeon: Alexis Frock, MD;  Location: [facility];  Service: Urology;  Laterality: Right;  . ESOPHAGOGASTRODUODENOSCOPY N/A 11/11/2015   Procedure: ESOPHAGOGASTRODUODENOSCOPY (EGD);  Surgeon: [name], MD;  Location[facility];  Service: Endoscopy;  Laterality: N/A;  . ESOPHAGOGASTRODUODENOSCOPY N/A 02/05/2016   Procedure: ESOPHAGOGASTRODUODENOSCOPY (EGD);  Surgeon: Ladene Artist, MD;  [facility];  Service: Endoscopy;  Laterality: N/A;  . ESOPHAGOGASTRODUODENOSCOPY N/A 04/02/2016   Procedure: ESOPHAGOGASTRODUODENOSCOPY (EGD);  Surgeon: [name], MD;  Location: [facility];   Service: Endoscopy;  Laterality: N/A;  . ESOPHAGOGASTRODUODENOSCOPY (EGD) WITH PROPOFOL N/A 04/06/2017   Procedure: ESOPHAGOGASTRODUODENOSCOPY (EGD) WITH PROPOFOL;  Surgeon: [name], MD;  [facility];  Service: Endoscopy;  Laterality: N/A;  . GASTRECTOMY N/A 01/15/2016   Procedure: OPEN PARTIAL GASTRECTOMY;  Surgeon: Stark Klein, MD;  Location: [facility];  Service: General;  Laterality: N/A;  . GASTRECTOMY Left 02/24/2016   Procedure: OPEN J TUBE;  Surgeon: Stark Klein, MD;  Location: [facility];  Service: General;  Laterality: Left;  [name] GASTROJEJUNOSTOMY N/A 05/18/2016   Procedure: ROUX EN Y GASTROJEJUNOSTOMY;  Surgeon: Stark Klein, MD;  Location: [facility];  Service: General;  Laterality: N/A;  . I&D KNEE WITH POLY EXCHANGE Right 12/17/2013   Procedure: IRRIGATION AND DEBRIDEMEN RIGHT TOTAL  KNEE WITH POLY EXCHANGE;  Surgeon: Mauri Pole, MD;  [facility];  Service: Orthopedics;  Laterality: Right;  . IR GENERIC HISTORICAL  06/17/2016   IR RADIOLOGIST EVAL & MGMT 06/17/2016 Ascencion Dike, PA-C [facility] INTER[facility] RAD  . LAPAROSCOPY N/A 01/15/2016   Procedure: LAPAROSCOPY DIAGNOSTIC;  Surgeon: Stark Klein, MD;  Location: [facility];  Service: General;  Laterality: N/A;  . LYSIS OF ADHESION  05/18/2016   Procedure: LYSIS OF ADHESION;  Surgeon: Stark Klein, MD;  Location: [facility];  Service: General;;  . NEPHRECTOMY Left    renal cell cancer  . PACEMAKER INSERTION  07/04/2011   MDT PPM implanted by Dr Doreatha Lew for tachy/brady; managed by Dr Thompson Grayer   . PATELLAR TENDON REPAIR Right 03/06/2014   Procedure: AVULSION WITH PATELLA TENDON REPAIR;  Surgeon: Mauri Pole, MD;  [facility];  Service: Orthopedics;  Laterality: Right;  . PATENT DUCTUS ARTERIOUS REPAIR  ~ 1949  . TOTAL ABDOMINAL HYSTERECTOMY  85  . TOTAL HIP REVISION Right 2009   initial replacment surg  . TOTAL KNEE ARTHROPLASTY  02/07/2012   Procedure: TOTAL KNEE ARTHROPLASTY;  Surgeon: Mauri Pole, MD;  [facility];   Service: Orthopedics;  Laterality: Right;  . TOTAL KNEE REVISION Right 07/29/2014   Procedure: RIGHT KNEE REVISION OF PREVIOUS REPAIR EXTENSOR MECHANISM;  Surgeon: Mauri Pole, MD;  [facility];  Service: Orthopedics;  Laterality: Right;  . TRANSURETHRAL RESECTION OF BLADDER TUMOR N/A 07/21/2018   Procedure: TRANSURETHRAL RESECTION OF BLADDER TUMOR (TURBT);  Surgeon: Alexis Frock, MD;  [facility];  Service: Urology;  Laterality: N/A;  . TRANSURETHRAL RESECTION OF BLADDER TUMOR WITH MITOMYCIN-C N/A 05/01/2018   Procedure: TRANSURETHRAL RESECTION OF BLADDER TUMOR WITH POST OP INSTILLATION OF GEMCITABINE;  Surgeon: Lucas Mallow, MD;  [facility];  Service: Urology;  Laterality: N/A;  . WHIPPLE PROCEDURE N/A 05/18/2016   Procedure: EXPLORATORY LAPAROTOMY;  Surgeon: Stark Klein, MD;  [facility];  Service: General;  Laterality: N/A;   Family History  Problem Relation Age of Onset  . Colon cancer Mother 58  . Heart disease Mother   . Heart disease Father   . Heart attack Father   . Heart disease Maternal Grandmother    Social History   Tobacco Use  . Smoking status: Never Smoker  . Smokeless tobacco: Never Used  Substance Use Topics  . Alcohol use: No  . Drug use: No    ROS Constitutional: Denies fever, chills, weight loss/gain, headaches, insomnia,  night sweats, and change in appetite. Does c/o fatigue. Eyes: Denies redness, blurred vision, diplopia, discharge, itchy, watery eyes.  ENT: Denies discharge, congestion, post nasal drip, epistaxis, sore throat, earache, hearing loss, dental pain, Tinnitus, Vertigo, Sinus pain, snoring.  Cardio: Denies chest pain, palpitations, irregular heartbeat, syncope, dyspnea, diaphoresis, orthopnea, PND, claudication, edema Respiratory: denies cough, dyspnea, DOE, pleurisy, hoarseness, laryngitis, wheezing.  Gastrointestinal: Denies dysphagia, heartburn, reflux, water brash, pain, cramps, nausea, vomiting, bloating,  diarrhea, constipation, hematemesis, melena, hematochezia, jaundice, hemorrhoids Genitourinary: Denies dysuria, frequency, urgency, nocturia, hesitancy, discharge, hematuria, flank pain Breast: Breast lumps, nipple discharge, bleeding.  Musculoskeletal: Denies arthralgia, myalgia, stiffness, Jt. Swelling, pain, limp, and strain/sprain. Denies falls. Skin: Denies puritis, rash, hives, warts, acne, eczema, changing in skin lesion Neuro: No weakness, tremor, incoordination, spasms, paresthesia, pain Psychiatric: Denies confusion, memory loss, sensory loss. Denies Depression. Endocrine: Denies change in weight, skin, hair change, nocturia, and paresthesia, diabetic polys, visual blurring, hyper / hypo glycemic episodes.  Heme/Lymph: No excessive bleeding, bruising, enlarged lymph nodes.  Physical Exam  BP (!) 168/68   Pulse 68   Temp (!) 97.5 F (36.4 C)   Resp 16   Ht 4\' 11"  (1.499 m)   Wt 99 lb 9.6 oz (45.2 kg)   BMI 20.12 kg/m   General Appearance: Well nourished, well groomed and in no apparent distress.  Eyes: PERRLA, EOMs, conjunctiva no swelling or erythema, normal fundi and vessels. Sinuses: No frontal/maxillary tenderness ENT/Mouth: EACs patent / TMs  nl. Nares clear without erythema, swelling, mucoid exudates. Oral hygiene is good. No erythema, swelling, or exudate. Tongue normal, non-obstructing. Tonsils not swollen or erythematous. Hearing normal.  Neck: Supple, thyroid not palpable. No bruits, nodes or JVD. Respiratory: Respiratory effort normal.  BS equal and clear bilateral without rales, rhonci, wheezing or stridor. Cardio: Heart sounds are soft with regular rate and rhythm and Ao Sys/Dias murmurs of AoS / AoI. Peripheral pulses are  1+/1+ and equal bilaterally 1-2 + ankle edema (Rt>Lt). No aortic or femoral bruits. Chest: symmetric with normal excursions and percussion. Sub-cut pacer disk Rt infraclavicular area. Breasts: Symmetric, without lumps, nipple discharge,  retractions, or fibrocystic changes.  Abdomen: Flat, soft with bowel sounds active. Nontender, no guarding, rebound, hernias, masses, or organomegaly.  Lymphatics: Non tender without lymphadenopathy.  Musculoskeletal: Full ROM all peripheral extremities, joint stability, 5/5 strength, and normal gait. Skin: Warm and dry without rashes, lesions, cyanosis, clubbing or  ecchymosis.  Neuro: Cranial nerves intact, reflexes equal bilaterally. Normal muscle tone, no cerebellar symptoms. Sensation intact.  Pysch: Alert  and oriented X 3, normal affect, Insight and Judgment appropriate.   Assessment and Plan  1. Annual Preventative Screening Examination  2. Essential hypertension  - EKG 12-Lead - Microalbumin / creatinine urine ratio - POC Hemoccult Bld/Stl (3-Cd Home Screen); Future - CBC with Differential/Platelet - COMPLETE METABOLIC PANEL WITH GFR - Magnesium - TSH - Urinalysis, Routine w reflex microscopic  - D/C  Diltiazem due to refractory edema & change to  - verapamil (CALAN-SR) 120 MG CR tablet; Take 1 tablet 2 x /day with a meal for BP & Heart Rhythm  Dispense: 180 tablet; Refill: 3  3. Hyperlipidemia, mixed  - EKG 12-Lead - Lipid panel - TSH  4. Abnormal glucose  - EKG 12-Lead - Hemoglobin A1c - Insulin, random  5. Vitamin D deficiency  - VITAMIN D 25 Hydroxy  6. Paroxysmal atrial fibrillation (HCC)  - EKG 12-Lead - Protime-INR  - D/C  Diltiazem due to refractory edema & change to  - verapamil (CALAN-SR) 120 MG CR tablet; Take 1 tablet 2 x /day with a meal for BP & Heart Rhythm  Dispense: 180 tablet; Refill: 3  7. CKD (chronic kidney disease), stage III (HCC)  - Microalbumin / creatinine urine ratio - POC Hemoccult Bld/Stl (3-Cd Home Screen); Future - Vitamin B12 - CBC with Differential/Platelet - COMPLETE METABOLIC PANEL WITH GFR - Urinalysis, Routine w reflex microscopic  8. Vitamin B12 deficiency  - Vitamin B12  9. Cardiac pacemaker   10.  Hypothyroidism  - TSH  11. Aortic stenosis, moderate  12. Post gastrectomy syndrome  - Vitamin B12 - Iron,Total/Total Iron Binding Cap  13. Iron deficiency  - Iron,Total/Total Iron Binding Cap  14. Screening for ischemic heart disease  - EKG 12-Lead  15. FHx: heart disease  - EKG 12-Lead  16. Long term current use of anticoagulant therapy  - Protime-INR  17. Medication management  - Microalbumin / creatinine urine ratio - CBC with Differential/Platelet - COMPLETE METABOLIC PANEL WITH GFR - Magnesium - Lipid panel - TSH - Hemoglobin A1c - Insulin, random - VITAMIN D 25 Hydroxyl - Urinalysis, Routine w reflex microscopic  18. S/P partial gastrectomy  - loperamide (IMODIUM A-D) 2 MG tablet; Take up to 12 tablets /day as needed for  for Diarrhea  Dispense: 120 tablet; Refill: 11           Patient was counseled in prudent diet to achieve/maintain BMI less than 25 for weight control, BP monitoring, regular exercise and medications. Discussed med's effects and SE's. Screening labs and tests as requested with regular follow-up as recommended. I discussed the assessment and treatment plan as above with the patient. The patient was provided an opportunity to ask questions and all were answered. The patient agreed with the plan and demonstrated an understanding of the instructions. I provided  minutes  over 40 minutes of exam, counseling, chart review and  complex critical decision making was performed   Kirtland Bouchard, MD

## 2019-01-30 ENCOUNTER — Other Ambulatory Visit: Payer: Self-pay

## 2019-01-30 ENCOUNTER — Ambulatory Visit (INDEPENDENT_AMBULATORY_CARE_PROVIDER_SITE_OTHER): Payer: Medicare Other | Admitting: Internal Medicine

## 2019-01-30 VITALS — BP 168/68 | HR 68 | Temp 97.5°F | Resp 16 | Ht 59.0 in | Wt 99.6 lb

## 2019-01-30 DIAGNOSIS — Z79899 Other long term (current) drug therapy: Secondary | ICD-10-CM

## 2019-01-30 DIAGNOSIS — I480 Paroxysmal atrial fibrillation: Secondary | ICD-10-CM

## 2019-01-30 DIAGNOSIS — Z7901 Long term (current) use of anticoagulants: Secondary | ICD-10-CM

## 2019-01-30 DIAGNOSIS — K911 Postgastric surgery syndromes: Secondary | ICD-10-CM | POA: Diagnosis not present

## 2019-01-30 DIAGNOSIS — E039 Hypothyroidism, unspecified: Secondary | ICD-10-CM

## 2019-01-30 DIAGNOSIS — Z0000 Encounter for general adult medical examination without abnormal findings: Secondary | ICD-10-CM | POA: Diagnosis not present

## 2019-01-30 DIAGNOSIS — E538 Deficiency of other specified B group vitamins: Secondary | ICD-10-CM | POA: Diagnosis not present

## 2019-01-30 DIAGNOSIS — Z950 Presence of cardiac pacemaker: Secondary | ICD-10-CM

## 2019-01-30 DIAGNOSIS — Z0001 Encounter for general adult medical examination with abnormal findings: Secondary | ICD-10-CM

## 2019-01-30 DIAGNOSIS — E611 Iron deficiency: Secondary | ICD-10-CM

## 2019-01-30 DIAGNOSIS — I350 Nonrheumatic aortic (valve) stenosis: Secondary | ICD-10-CM

## 2019-01-30 DIAGNOSIS — R7309 Other abnormal glucose: Secondary | ICD-10-CM

## 2019-01-30 DIAGNOSIS — E559 Vitamin D deficiency, unspecified: Secondary | ICD-10-CM

## 2019-01-30 DIAGNOSIS — I10 Essential (primary) hypertension: Secondary | ICD-10-CM | POA: Diagnosis not present

## 2019-01-30 DIAGNOSIS — Z903 Acquired absence of stomach [part of]: Secondary | ICD-10-CM

## 2019-01-30 DIAGNOSIS — E782 Mixed hyperlipidemia: Secondary | ICD-10-CM

## 2019-01-30 DIAGNOSIS — Z8249 Family history of ischemic heart disease and other diseases of the circulatory system: Secondary | ICD-10-CM

## 2019-01-30 DIAGNOSIS — Z136 Encounter for screening for cardiovascular disorders: Secondary | ICD-10-CM

## 2019-01-30 DIAGNOSIS — N183 Chronic kidney disease, stage 3 unspecified: Secondary | ICD-10-CM

## 2019-01-30 MED ORDER — VERAPAMIL HCL ER 120 MG PO TBCR: EXTENDED_RELEASE_TABLET | ORAL | 3 refills | Status: DC

## 2019-01-30 MED ORDER — LOPERAMIDE HCL 2 MG PO TABS: ORAL_TABLET | ORAL | 11 refills | Status: AC

## 2019-01-31 DIAGNOSIS — Z96651 Presence of right artificial knee joint: Secondary | ICD-10-CM | POA: Diagnosis not present

## 2019-01-31 DIAGNOSIS — I272 Pulmonary hypertension, unspecified: Secondary | ICD-10-CM | POA: Diagnosis not present

## 2019-01-31 DIAGNOSIS — I2510 Atherosclerotic heart disease of native coronary artery without angina pectoris: Secondary | ICD-10-CM | POA: Diagnosis not present

## 2019-01-31 DIAGNOSIS — I050 Rheumatic mitral stenosis: Secondary | ICD-10-CM | POA: Diagnosis not present

## 2019-01-31 DIAGNOSIS — Z8551 Personal history of malignant neoplasm of bladder: Secondary | ICD-10-CM | POA: Diagnosis not present

## 2019-01-31 DIAGNOSIS — Z7901 Long term (current) use of anticoagulants: Secondary | ICD-10-CM | POA: Diagnosis not present

## 2019-01-31 DIAGNOSIS — I480 Paroxysmal atrial fibrillation: Secondary | ICD-10-CM | POA: Diagnosis not present

## 2019-01-31 DIAGNOSIS — G2581 Restless legs syndrome: Secondary | ICD-10-CM | POA: Diagnosis not present

## 2019-01-31 DIAGNOSIS — K219 Gastro-esophageal reflux disease without esophagitis: Secondary | ICD-10-CM | POA: Diagnosis not present

## 2019-01-31 DIAGNOSIS — N189 Chronic kidney disease, unspecified: Secondary | ICD-10-CM | POA: Diagnosis not present

## 2019-01-31 DIAGNOSIS — Z8553 Personal history of malignant neoplasm of renal pelvis: Secondary | ICD-10-CM | POA: Diagnosis not present

## 2019-01-31 DIAGNOSIS — I130 Hypertensive heart and chronic kidney disease with heart failure and stage 1 through stage 4 chronic kidney disease, or unspecified chronic kidney disease: Secondary | ICD-10-CM | POA: Diagnosis not present

## 2019-01-31 DIAGNOSIS — E039 Hypothyroidism, unspecified: Secondary | ICD-10-CM | POA: Diagnosis not present

## 2019-01-31 DIAGNOSIS — G5791 Unspecified mononeuropathy of right lower limb: Secondary | ICD-10-CM | POA: Diagnosis not present

## 2019-01-31 DIAGNOSIS — Z950 Presence of cardiac pacemaker: Secondary | ICD-10-CM | POA: Diagnosis not present

## 2019-01-31 DIAGNOSIS — Z8673 Personal history of transient ischemic attack (TIA), and cerebral infarction without residual deficits: Secondary | ICD-10-CM | POA: Diagnosis not present

## 2019-01-31 DIAGNOSIS — Z85028 Personal history of other malignant neoplasm of stomach: Secondary | ICD-10-CM | POA: Diagnosis not present

## 2019-01-31 DIAGNOSIS — Z905 Acquired absence of kidney: Secondary | ICD-10-CM | POA: Diagnosis not present

## 2019-01-31 DIAGNOSIS — I5033 Acute on chronic diastolic (congestive) heart failure: Secondary | ICD-10-CM | POA: Diagnosis not present

## 2019-01-31 DIAGNOSIS — H6523 Chronic serous otitis media, bilateral: Secondary | ICD-10-CM | POA: Diagnosis not present

## 2019-01-31 DIAGNOSIS — K3184 Gastroparesis: Secondary | ICD-10-CM | POA: Diagnosis not present

## 2019-01-31 LAB — HEMOGLOBIN A1C
Hgb A1c MFr Bld: 4.9 % of total Hgb (ref <5.7)
Mean Plasma Glucose: 94 (calc)
eAG (mmol/L): 5.2 (calc)

## 2019-01-31 LAB — CBC WITH DIFFERENTIAL/PLATELET
Absolute Monocytes: 474 cells/uL (ref 200–950)
Basophils Absolute: 18 cells/uL (ref 0–200)
Basophils Relative: 0.3 %
Eosinophils Absolute: 108 cells/uL (ref 15–500)
Eosinophils Relative: 1.8 %
HCT: 37.6 % (ref 35.0–45.0)
Hemoglobin: 12.6 g/dL (ref 11.7–15.5)
Lymphs Abs: 1410 cells/uL (ref 850–3900)
MCH: 31.4 pg (ref 27.0–33.0)
MCHC: 33.5 g/dL (ref 32.0–36.0)
MCV: 93.8 fL (ref 80.0–100.0)
MPV: 10.6 fL (ref 7.5–12.5)
Monocytes Relative: 7.9 %
Neutro Abs: 3990 cells/uL (ref 1500–7800)
Neutrophils Relative %: 66.5 %
Platelets: 208 10*3/uL (ref 140–400)
RBC: 4.01 10*6/uL (ref 3.80–5.10)
RDW: 12.3 % (ref 11.0–15.0)
Total Lymphocyte: 23.5 %
WBC: 6 10*3/uL (ref 3.8–10.8)

## 2019-01-31 LAB — COMPLETE METABOLIC PANEL WITH GFR
AG Ratio: 1.7 (calc) (ref 1.0–2.5)
ALT: 55 U/L — ABNORMAL HIGH (ref 6–29)
AST: 31 U/L (ref 10–35)
Albumin: 3.9 g/dL (ref 3.6–5.1)
Alkaline phosphatase (APISO): 159 U/L — ABNORMAL HIGH (ref 37–153)
BUN/Creatinine Ratio: 18 (calc) (ref 6–22)
BUN: 17 mg/dL (ref 7–25)
CO2: 24 mmol/L (ref 20–32)
Calcium: 8.9 mg/dL (ref 8.6–10.4)
Chloride: 109 mmol/L (ref 98–110)
Creat: 0.94 mg/dL — ABNORMAL HIGH (ref 0.60–0.88)
GFR, Est African American: 65 mL/min/{1.73_m2} (ref >=60)
GFR, Est Non African American: 56 mL/min/{1.73_m2} — ABNORMAL LOW (ref >=60)
Globulin: 2.3 g/dL (calc) (ref 1.9–3.7)
Glucose, Bld: 85 mg/dL (ref 65–99)
Potassium: 3.7 mmol/L (ref 3.5–5.3)
Sodium: 143 mmol/L (ref 135–146)
Total Bilirubin: 0.4 mg/dL (ref 0.2–1.2)
Total Protein: 6.2 g/dL (ref 6.1–8.1)

## 2019-01-31 LAB — LIPID PANEL
Cholesterol: 132 mg/dL (ref <200)
HDL: 36 mg/dL — ABNORMAL LOW (ref >=50)
LDL Cholesterol (Calc): 78 mg/dL (calc)
Non-HDL Cholesterol (Calc): 96 mg/dL (calc) (ref <130)
Total CHOL/HDL Ratio: 3.7 (calc) (ref <5.0)
Triglycerides: 96 mg/dL (ref <150)

## 2019-01-31 LAB — MAGNESIUM: Magnesium: 1.5 mg/dL (ref 1.5–2.5)

## 2019-01-31 LAB — IRON, TOTAL/TOTAL IRON BINDING CAP
%SAT: 8 % (calc) — ABNORMAL LOW (ref 16–45)
Iron: 34 ug/dL — ABNORMAL LOW (ref 45–160)
TIBC: 419 mcg/dL (calc) (ref 250–450)

## 2019-01-31 LAB — VITAMIN B12: Vitamin B-12: 238 pg/mL (ref 200–1100)

## 2019-01-31 LAB — TSH: TSH: 1.04 mIU/L (ref 0.40–4.50)

## 2019-01-31 LAB — PROTIME-INR
INR: 3.3 — ABNORMAL HIGH
Prothrombin Time: 31.7 s — ABNORMAL HIGH (ref 9.0–11.5)

## 2019-01-31 LAB — VITAMIN D 25 HYDROXY (VIT D DEFICIENCY, FRACTURES): Vit D, 25-Hydroxy: 48 ng/mL (ref 30–100)

## 2019-01-31 LAB — INSULIN, RANDOM: Insulin: 2.7 u[IU]/mL

## 2019-02-01 ENCOUNTER — Ambulatory Visit (INDEPENDENT_AMBULATORY_CARE_PROVIDER_SITE_OTHER): Payer: Medicare Other

## 2019-02-01 ENCOUNTER — Telehealth: Payer: Self-pay | Admitting: *Deleted

## 2019-02-01 DIAGNOSIS — Z7901 Long term (current) use of anticoagulants: Secondary | ICD-10-CM

## 2019-02-01 DIAGNOSIS — I480 Paroxysmal atrial fibrillation: Secondary | ICD-10-CM | POA: Diagnosis not present

## 2019-02-01 NOTE — Telephone Encounter (Signed)
Patient's daughter was informed of abnormal PT/INR results. She was informed the results has been sent to [name] at cardiology, and she will call them,if she does not get a call from them.

## 2019-02-01 NOTE — Patient Instructions (Signed)
Description   Spoke with [name], pt daughter, advised to have pt skip today's dosage of Coumadin, then resume same dosage 1 tablet daily except 1.5 tablets on Fridays. Recheck INR in 4 weeks. Call us with any medication changes or concerns # [phone].

## 2019-02-08 DIAGNOSIS — C672 Malignant neoplasm of lateral wall of bladder: Secondary | ICD-10-CM | POA: Diagnosis not present

## 2019-02-09 ENCOUNTER — Other Ambulatory Visit: Payer: Self-pay | Admitting: Internal Medicine

## 2019-02-15 DIAGNOSIS — E039 Hypothyroidism, unspecified: Secondary | ICD-10-CM | POA: Diagnosis not present

## 2019-02-15 DIAGNOSIS — I272 Pulmonary hypertension, unspecified: Secondary | ICD-10-CM | POA: Diagnosis not present

## 2019-02-15 DIAGNOSIS — N189 Chronic kidney disease, unspecified: Secondary | ICD-10-CM | POA: Diagnosis not present

## 2019-02-15 DIAGNOSIS — Z8551 Personal history of malignant neoplasm of bladder: Secondary | ICD-10-CM | POA: Diagnosis not present

## 2019-02-15 DIAGNOSIS — I2510 Atherosclerotic heart disease of native coronary artery without angina pectoris: Secondary | ICD-10-CM | POA: Diagnosis not present

## 2019-02-15 DIAGNOSIS — K3184 Gastroparesis: Secondary | ICD-10-CM | POA: Diagnosis not present

## 2019-02-15 DIAGNOSIS — I480 Paroxysmal atrial fibrillation: Secondary | ICD-10-CM | POA: Diagnosis not present

## 2019-02-15 DIAGNOSIS — G5791 Unspecified mononeuropathy of right lower limb: Secondary | ICD-10-CM | POA: Diagnosis not present

## 2019-02-15 DIAGNOSIS — Z7901 Long term (current) use of anticoagulants: Secondary | ICD-10-CM | POA: Diagnosis not present

## 2019-02-15 DIAGNOSIS — Z8673 Personal history of transient ischemic attack (TIA), and cerebral infarction without residual deficits: Secondary | ICD-10-CM | POA: Diagnosis not present

## 2019-02-15 DIAGNOSIS — I5033 Acute on chronic diastolic (congestive) heart failure: Secondary | ICD-10-CM | POA: Diagnosis not present

## 2019-02-15 DIAGNOSIS — K219 Gastro-esophageal reflux disease without esophagitis: Secondary | ICD-10-CM | POA: Diagnosis not present

## 2019-02-15 DIAGNOSIS — Z85028 Personal history of other malignant neoplasm of stomach: Secondary | ICD-10-CM | POA: Diagnosis not present

## 2019-02-15 DIAGNOSIS — H6523 Chronic serous otitis media, bilateral: Secondary | ICD-10-CM | POA: Diagnosis not present

## 2019-02-15 DIAGNOSIS — Z950 Presence of cardiac pacemaker: Secondary | ICD-10-CM | POA: Diagnosis not present

## 2019-02-15 DIAGNOSIS — Z905 Acquired absence of kidney: Secondary | ICD-10-CM | POA: Diagnosis not present

## 2019-02-15 DIAGNOSIS — Z96651 Presence of right artificial knee joint: Secondary | ICD-10-CM | POA: Diagnosis not present

## 2019-02-15 DIAGNOSIS — G2581 Restless legs syndrome: Secondary | ICD-10-CM | POA: Diagnosis not present

## 2019-02-15 DIAGNOSIS — Z8553 Personal history of malignant neoplasm of renal pelvis: Secondary | ICD-10-CM | POA: Diagnosis not present

## 2019-02-15 DIAGNOSIS — I130 Hypertensive heart and chronic kidney disease with heart failure and stage 1 through stage 4 chronic kidney disease, or unspecified chronic kidney disease: Secondary | ICD-10-CM | POA: Diagnosis not present

## 2019-02-15 DIAGNOSIS — I050 Rheumatic mitral stenosis: Secondary | ICD-10-CM | POA: Diagnosis not present

## 2019-02-20 NOTE — Telephone Encounter (Signed)
LVM for daughter, [name] to call back and reschedule her mother  for July appt. I advised her that due to Covid 19 we are still limiting pts coming into office. Left number.

## 2019-02-26 ENCOUNTER — Ambulatory Visit: Payer: Self-pay | Admitting: Diagnostic Neuroimaging

## 2019-02-26 ENCOUNTER — Telehealth: Payer: Self-pay

## 2019-02-26 NOTE — Telephone Encounter (Signed)

## 2019-02-27 ENCOUNTER — Emergency Department
Admission: EM | Admit: 2019-02-27 | Discharge: 2019-02-27 | Disposition: A | Discharge status: Home / Self Care | Payer: Medicare Other | Source: Home / Self Care

## 2019-02-27 ENCOUNTER — Encounter: Payer: Self-pay | Admitting: Emergency Medicine

## 2019-02-27 ENCOUNTER — Emergency Department (INDEPENDENT_AMBULATORY_CARE_PROVIDER_SITE_OTHER): Payer: Medicare Other

## 2019-02-27 ENCOUNTER — Telehealth: Payer: Self-pay | Admitting: Cardiology

## 2019-02-27 ENCOUNTER — Other Ambulatory Visit: Payer: Self-pay

## 2019-02-27 ENCOUNTER — Encounter: Payer: Self-pay | Admitting: *Deleted

## 2019-02-27 DIAGNOSIS — R0789 Other chest pain: Secondary | ICD-10-CM | POA: Diagnosis not present

## 2019-02-27 DIAGNOSIS — W010XXA Fall on same level from slipping, tripping and stumbling without subsequent striking against object, initial encounter: Secondary | ICD-10-CM

## 2019-02-27 DIAGNOSIS — R079 Chest pain, unspecified: Secondary | ICD-10-CM | POA: Diagnosis not present

## 2019-02-27 DIAGNOSIS — S20211A Contusion of right front wall of thorax, initial encounter: Secondary | ICD-10-CM

## 2019-02-27 DIAGNOSIS — J90 Pleural effusion, not elsewhere classified: Secondary | ICD-10-CM | POA: Diagnosis not present

## 2019-02-27 DIAGNOSIS — S00531A Contusion of lip, initial encounter: Secondary | ICD-10-CM | POA: Diagnosis not present

## 2019-02-27 NOTE — Telephone Encounter (Signed)
She is to take lasix 20 mg for fluid retention. Decrease the losartan to 25 mg BID from 50 mg BID due to hypotension. Follow up appointment is,needed in person.

## 2019-02-27 NOTE — ED Provider Notes (Signed)
[name]    CSN: [id number] Arrival date & time: 02/27/19  0848     History   Chief Complaint Chief Complaint  Patient presents with  . Fall    HPI [name] is a 85 y.o. female.   HPI  [name] is a 85 y.o. female presenting to UC with daughter after pt fell in bathroom this morning at 6AM.  Pt went to the bathroom using her walker, once in the bathroom she bent down to lower her pants and was leaning on her walker when it rolled out from under her, causing her to fall and hit her upper lip but mainly her chest on the edge of her walk-in shower.  Pain is moderate in severity, aching and sore over her sternum, worse with movement of her arms.  She took Tylenol PTA with mild relief. Denies HA, dizziness, LOC, nausea, confusion. Although she hit her lip, she denies injuring her teeth or having mouth pain.  Pt is on coumadin for paroxysmal afib. She does have a Psychologist, forensic.  Pt lives with her daughter and confirms no LOC as she heard her mother fall and immediately call out for help.   Daughter does note, unrelated to fall, pt did weight 3-4 more pounds than she usually does, which has daughter a little concerned about pt's heart and fluid retention.  She has not taken her lasix for the day.  Daughter asked if we can check for fluid in her lungs while she is here.  Pt denies cough, SOB, or weakness, which she typically has with her CHF exacerbations.  Per daughter, pt has otherwise been acting herself, she has not been confused or acting different since the fall.   Past Medical History:  Diagnosis Date  . Adenocarcinoma of stomach ([place]) 11/11/2015   gastric mass on egd  . Anal fissure   . Anemia    hx 3/17 iron infusion, on aranesp and injected B12 since 11/2015.   [name] Anxiety   . Aortic root dilatation ([place])   . Arthritis    oa  . 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 CAD (coronary artery disease)   . Chronic kidney disease    only has one kidney rt lft rem 03 ca      dr. Risa Grill, bladder cancer 05-04-2018  . Elevated LFTs    hepatic steatosis, none recent  . Gastroparesis   . GERD (gastroesophageal reflux disease)   . History of uterine cancer 1980s   treated with hysterectomy, external radiation and radiation seed implants.   [name] HTN (hypertension)   . Hyperlipemia   . Hypothyroidism   . Iron deficiency anemia due to chronic blood loss 11/24/2015  . LVH (left ventricular hypertrophy) 11/14/2015   Mild, noted on ECHO  . Moderate aortic stenosis   . Neuropathy    right leg  . No blood products 02/27/2016  . Osteomyelitis ([place]) as child  . PAF (paroxysmal atrial fibrillation) ([place])   . PONV (postoperative nausea and vomiting)    long time ago  . Presence of permanent cardiac pacemaker   . Radiation proctitis   . Restless leg syndrome   . Stroke ([place]) 15   partial stroke left legally blind; left eye   . Tachycardia-bradycardia syndrome [facility])    s/p PPM by Dr Doreatha Lew (MDT) 07/03/10    Patient Active Problem List   Diagnosis Date Noted  . Abnormal glucose 01/29/2019  . Acute on chronic diastolic heart failure ([place]) 01/05/2019  .  Malignant tumor of urinary bladder ([place]) 05/11/2018  . History of stroke 04/11/2018  . Long term current use of anticoagulant therapy 03/19/2016  . No blood products 02/27/2016  . S/P partial gastrectomy 02/17/2016  . Post gastrectomy syndrome   . Gastroesophageal reflux disease with esophagitis   . B12 deficiency 11/24/2015  . Iron deficiency anemia due to chronic blood loss 11/24/2015  . Refusal of blood transfusions as patient is Jehovah's Witness   . Adenocarcinoma of stomach ([place]) 11/11/2015  . CKD (chronic kidney disease), stage III ([place]) 11/10/2015  . Vitamin B12 deficiency 11/09/2015  . Essential hypertension 06/04/2014  . Prediabetes 09/24/2013  . Vitamin D deficiency 09/24/2013  . Hyperlipidemia, mixed   . Cardiac pacemaker  08/26/2012  . FHx/o Colon Cancer 07/27/2011  . Aortic stenosis, moderate  07/09/2011  . S/P repair of patent ductus arteriosus 04/22/2011  . Tachycardia-bradycardia syndrome ([place]) 12/24/2010  . Atrial fibrillation ([place]) Chadsvasc 5 12/02/2010  . Malignant neoplasm of kidney excluding renal pelvis ([place]) 11/17/2007  . Hypothyroidism 11/17/2007    Past Surgical History:  Procedure Laterality Date  . CARDIAC CATHETERIZATION  2008  . CHOLECYSTECTOMY  1970s  . COLONOSCOPY N/A 11/11/2015   Procedure: COLONOSCOPY;  Surgeon: [name], MD;  [facility];  Service: Endoscopy;  Laterality: N/A;  . CYSTOSCOPY W/ RETROGRADES Right 05/01/2018   Procedure: CYSTOSCOPY WITH RIGHT RETROGRADE PYELOGRAM;  Surgeon: Lucas Mallow, MD;  [facility];  Service: Urology;  Laterality: Right;  . CYSTOSCOPY W/ RETROGRADES Right 07/21/2018   Procedure: CYSTOSCOPY WITH RIGHT RETROGRADE PYELOGRAM;  Surgeon: Alexis Frock, MD;  [facility];  Service: Urology;  Laterality: Right;  . ESOPHAGOGASTRODUODENOSCOPY N/A 11/11/2015   Procedure: ESOPHAGOGASTRODUODENOSCOPY (EGD);  Surgeon: [name], MD;  [facility];  Service: Endoscopy;  Laterality: N/A;  . ESOPHAGOGASTRODUODENOSCOPY N/A 02/05/2016   Procedure: ESOPHAGOGASTRODUODENOSCOPY (EGD);  Surgeon: Ladene Artist, MD;  Location: [facility] END[facility]COPY;  Service: Endoscopy;  Laterality: N/A;  . ESOPHAGOGASTRODUODENOSCOPY N/A 04/02/2016   Procedure: ESOPHAGOGASTRODUODENOSCOPY (EGD);  Surgeon: [name], MD;  Location: [facility];  Service: Endoscopy;  Laterality: N/A;  . ESOPHAGOGASTRODUODENOSCOPY (EGD) WITH PROPOFOL N/A 04/06/2017   Procedure: ESOPHAGOGASTRODUODENOSCOPY (EGD) WITH PROPOFOL;  Surgeon: [name], MD;  [facility];  Service: Endoscopy;  Laterality: N/A;  . GASTRECTOMY N/A 01/15/2016   Procedure: OPEN PARTIAL GASTRECTOMY;  Surgeon: Stark Klein, MD;  Location: [place];  Service: General;  Laterality: N/A;  . GASTRECTOMY Left 02/24/2016   Procedure: OPEN J TUBE;  Surgeon: Stark Klein,  MD;  Location: [place];  Service: General;  Laterality: Left;  [name] GASTROJEJUNOSTOMY N/A 05/18/2016   Procedure: ROUX EN Y GASTROJEJUNOSTOMY;  Surgeon: Stark Klein, MD;  Location: [place];  Service: General;  Laterality: N/A;  . I&D KNEE WITH POLY EXCHANGE Right 12/17/2013   Procedure: IRRIGATION AND DEBRIDEMEN RIGHT TOTAL  KNEE WITH POLY EXCHANGE;  Surgeon: Mauri Pole, MD;  [facility];  Service: Orthopedics;  Laterality: Right;  . IR GENERIC HISTORICAL  06/17/2016   IR RADIOLOGIST EVAL & MGMT 06/17/2016 Ascencion Dike, PA-C [facility] INTERV RAD  . LAPAROSCOPY N/A 01/15/2016   Procedure: LAPAROSCOPY DIAGNOSTIC;  Surgeon: Stark Klein, MD;  Location: [place];  Service: General;  Laterality: N/A;  . LYSIS OF ADHESION  05/18/2016   Procedure: LYSIS OF ADHESION;  Surgeon: Stark Klein, MD;  Location: [place];  Service: General;;  . NEPHRECTOMY Left    renal cell cancer  . PACEMAKER INSERTION  07/04/2011  MDT PPM implanted by Dr Doreatha Lew for tachy/brady; managed by Dr Thompson Grayer   . PATELLAR TENDON REPAIR Right 03/06/2014   Procedure: AVULSION WITH PATELLA TENDON REPAIR;  Surgeon: Mauri Pole, MD;  [facility];  Service: Orthopedics;  Laterality: Right;  . PATENT DUCTUS ARTERIOUS REPAIR  ~ 1949  . TOTAL ABDOMINAL HYSTERECTOMY  85  . TOTAL HIP REVISION Right 2009   initial replacment surg  . TOTAL KNEE ARTHROPLASTY  02/07/2012   Procedure: TOTAL KNEE ARTHROPLASTY;  Surgeon: Mauri Pole, MD;  [facility];  Service: Orthopedics;  Laterality: Right;  . TOTAL KNEE REVISION Right 07/29/2014   Procedure: RIGHT KNEE REVISION OF PREVIOUS REPAIR EXTENSOR MECHANISM;  Surgeon: Mauri Pole, MD;  [facility];  Service: Orthopedics;  Laterality: Right;  . TRANSURETHRAL RESECTION OF BLADDER TUMOR N/A 07/21/2018   Procedure: TRANSURETHRAL RESECTION OF BLADDER TUMOR (TURBT);  Surgeon: Alexis Frock, MD;  [facility];  Service: Urology;  Laterality: N/A;  . TRANSURETHRAL RESECTION OF BLADDER  TUMOR WITH MITOMYCIN-C N/A 05/01/2018   Procedure: TRANSURETHRAL RESECTION OF BLADDER TUMOR WITH POST OP INSTILLATION OF GEMCITABINE;  Surgeon: Lucas Mallow, MD;  [facility];  Service: Urology;  Laterality: N/A;  . WHIPPLE PROCEDURE N/A 05/18/2016   Procedure: EXPLORATORY LAPAROTOMY;  Surgeon: Stark Klein, MD;  Location: [place];  Service: General;  Laterality: N/A;    OB History   No obstetric history on file.      Home Medications    Prior to Admission medications   Medication Sig Start Date End Date Taking? Authorizing Provider  acetaminophen (TYLENOL) 500 MG tablet Take 500-1,000 mg by mouth every 6 (six) hours as needed for mild pain or headache.     [provider]  Biotin 10 MG CAPS Take 10 mg by mouth daily.     [provider]  cetirizine (ZYRTEC) 10 MG tablet Take 10 mg by mouth daily.    [provider]  Cholecalciferol (VITAMIN D-3) 5000 units TABS Take 5,000 Units by mouth 2 (two) times a day.     [provider]  famotidine (PEPCID) 40 MG tablet Take 1 tablet (40 mg total) by mouth daily. 08/09/18   [name], MD  furosemide (LASIX) 20 MG tablet Take 1 tablet (20 mg total) by mouth daily as needed (For weight gain of greater than 3 lbs). 01/09/19 04/09/19  Lendon Colonel, NP  gabapentin (NEURONTIN) 300 MG capsule Take 1 capsule by mouth 2 (two) times a day.    [provider]  levothyroxine (SYNTHROID) 100 MCG tablet TAKE 1/2 TABLET BY MOUTH DAILY BEFORE BREAKFAST. 02/09/19   [name], NP  loperamide (IMODIUM A-D) 2 MG tablet Take up to 12 tablets /day as needed for  for Diarrhea 01/30/19   [name], MD  losartan (COZAAR) 25 MG tablet Take 2 tablets (50 mg total) by mouth daily. 01/04/19   [name], [name], MD  metoprolol tartrate (LOPRESSOR) 100 MG tablet Take 0.5 tablets (50 mg total) by mouth 2 (two) times daily. 01/09/19   Lendon Colonel, NP  nitroGLYCERIN (NITROSTAT) 0.4 MG SL tablet Place 1 tablet  (0.4 mg total) under the tongue every 5 (five) minutes as needed for chest pain (MAX 3 TABLETS). 02/17/17   [name], MD  ondansetron (ZOFRAN-ODT) 8 MG disintegrating tablet Take 8 mg by mouth every 8 (eight) hours as needed for nausea or vomiting.     [provider]  simethicone (MYLICON) 80  MG chewable tablet Chew 80 mg by mouth every 6 (six) hours as needed for flatulence.    [provider]  sucralfate (CARAFATE) 1 g tablet TAKE 1 TABLET (1 G TOTAL) BY MOUTH 4 (FOUR) TIMES DAILY - WITH MEALS AND AT BEDTIME. Patient taking differently: Take 1 g by mouth 3 (three) times daily.  08/04/18   [name], MD  verapamil (CALAN-SR) 120 MG CR tablet Take 1 tablet 2 x /day with a meal for BP & Heart Rhythm 01/30/19   [name], MD  warfarin (COUMADIN) 2 MG tablet TAKE 1 TABLET BY MOUTH DAILY OR AS DIRECTED Patient taking differently: TAKE 1 TABLET BY MOUTH DAILY OR AS DIRECTED 10/31/18   [name], MD    Family History Family History  Problem Relation Age of Onset  . Colon cancer Mother 26  . Heart disease Mother   . Heart disease Father   . Heart attack Father   . Heart disease Maternal Grandmother     Social History Social History   Tobacco Use  . Smoking status: Never Smoker  . Smokeless tobacco: Never Used  Substance Use Topics  . Alcohol use: No  . Drug use: No     Allergies   Adhesive [tape], Amiodarone, Hydrocodone, Remeron [mirtazapine], Codeine, Morphine and related, Reglan [metoclopramide], Requip [ropinirole hcl], Zinc, and Other   Review of Systems Review of Systems  Constitutional: Negative for fatigue.  Eyes: Negative for visual disturbance.  Respiratory: Negative for cough, chest tightness and shortness of breath.   Cardiovascular: Positive for chest pain. Negative for palpitations and leg swelling.  Gastrointestinal: Negative for abdominal pain.  Musculoskeletal: Positive for gait problem. Negative for arthralgias, back  pain (chronic, uses walker), joint swelling, myalgias, neck pain and neck stiffness.  Skin: Positive for color change and wound.  Neurological: Negative for dizziness, syncope, weakness, light-headedness, numbness and headaches.     Physical Exam Triage Vital Signs ED Triage Vitals  Enc Vitals Group     BP 02/27/19 0928 129/80     Pulse Rate 02/27/19 0928 80     Resp --      Temp 02/27/19 0928 98.4 F (36.9 C)     Temp Source 02/27/19 0928 Oral     SpO2 02/27/19 0928 98 %     Weight --      Height --      Head Circumference --      Peak Flow --      Pain Score 02/27/19 0929 9     Pain Loc --      Pain Edu? --      Excl. in [place]? --    No data found.  Updated Vital Signs BP 129/80 (BP Location: Right Arm)   Pulse 80   Temp 98.4 F (36.9 C) (Oral)   SpO2 98%   Visual Acuity Right Eye Distance:   Left Eye Distance:   Bilateral Distance:    Right Eye Near:   Left Eye Near:    Bilateral Near:     Physical Exam Vitals signs and nursing note reviewed.  Constitutional:      Appearance: Normal appearance. She is well-developed.  HENT:     Head: Normocephalic. Abrasion and contusion present.      Comments: Upper lip: mild edema, faint ecchymosis, superficial abrasion.     Right Ear: Tympanic membrane normal.     Left Ear: Tympanic membrane normal.     Nose: Nose normal. No nasal deformity, signs of injury  or nasal tenderness.     Right Sinus: No maxillary sinus tenderness or frontal sinus tenderness.     Left Sinus: No maxillary sinus tenderness or frontal sinus tenderness.     Mouth/Throat:     Lips: Pink.     Mouth: Mucous membranes are moist.     Dentition: No dental tenderness.     Pharynx: Oropharynx is clear. Uvula midline.  Eyes:     Extraocular Movements: Extraocular movements intact.     Conjunctiva/sclera: Conjunctivae normal.     Pupils: Pupils are equal, round, and reactive to light.  Neck:     Musculoskeletal: Normal range of motion and neck supple.      Comments: No midline bone tenderness, no crepitus or step-offs.  Cardiovascular:     Rate and Rhythm: Normal rate and regular rhythm.  Pulmonary:     Effort: Pulmonary effort is normal.     Breath sounds: Normal breath sounds.  Chest:     Chest wall: Tenderness present.    Abdominal:     General: There is no distension.     Palpations: Abdomen is soft.     Tenderness: There is no abdominal tenderness.  Musculoskeletal: Normal range of motion.     Right lower leg: Edema (minimal ) present.     Comments: No spinal tenderness. Full ROM upper extremities with pain radiating into chest wall. No tenderness of arms. No tenderness to hips or lower extremities.   Skin:    General: Skin is warm and dry.     Capillary Refill: Capillary refill takes less than 2 seconds.  Neurological:     General: No focal deficit present.     Mental Status: She is alert and oriented to person, place, and time.     Cranial Nerves: No cranial nerve deficit.     Motor: No weakness.     Comments: Speech is clear. Pt alert to person, place, time, and event.  Psychiatric:        Mood and Affect: Mood normal.        Behavior: Behavior normal.      UC Treatments / Results  Labs (all labs ordered are listed, but only abnormal results are displayed) Labs Reviewed - No data to display  EKG None  Radiology Dg Chest 2 View  Result Date: 02/27/2019 CLINICAL DATA:  Pain following fall EXAM: CHEST - 2 VIEW COMPARISON:  October 13, 2018 FINDINGS: There is a small left pleural effusion. There is no edema or consolidation. Heart is upper normal in size with pulmonary vascularity normal. Pacemaker lead tips are attached to the right atrium and right ventricle. There is aortic atherosclerosis. No adenopathy. No pneumothorax evident. Bones are osteoporotic. No fracture is appreciable. IMPRESSION: Small left pleural effusion. No edema or consolidation. Stable cardiac silhouette. Pacemaker leads attached to right  atrium and right ventricle. Aortic Atherosclerosis (ICD10-I70.0). No evident pneumothorax. No fracture evident. Bones are osteoporotic. Electronically Signed   By: Lowella Grip III M.D.   On: 02/27/2019 09:55    Procedures Procedures (including critical care time)  Medications Ordered in UC Medications - No data to display  Initial Impression / Assessment and Plan / UC Course  I have reviewed the triage vital signs and the nursing notes.  Pertinent labs & imaging results that were available during my care of the patient were reviewed by me and considered in my medical decision making (see chart for details).     Pt tripped and fell in bathroom this  morning. She did bump her upper lip but denies facial or head pain. Sternal pain is most bothersome for pt.  Daughter notes concern for possible early CHF exacerbation due to recent weight gait.    Normal neuro exam. Besides from lip contusion and abrasion, no other evidence of head trauma. No neck tenderness.  Will hold off on CT scan at this time as pt is over 3 hours post fall and neurologically well.   Discussed CXR with pt and daughter.  Encouraged to f/u with cardiology or PCP for monitoring of pleural effusion.  Daughter lives with pt, encouraged to monitor pt today. Discussed signs/symptoms that warrant emergency department evaluation. Pt and daughter verbalized understanding and agreement with tx plan.  Final Clinical Impressions(s) / UC Diagnoses   Final diagnoses:  Fall from slip, trip, or stumble, initial encounter  Contusion of lip, initial encounter  Contusion of right chest wall, initial encounter  Pleural effusion on left  Chest wall pain     Discharge Instructions      Please monitor for worsening symptoms including headache, dizziness, nausea/vomiting, confusion, or other new concerning symptoms develop. Call 911 or have your daughter take you to the closest hospital for further evaluation with a head  scan.  You were found to have a small amount of fluid on your Left lower lung. Please call to follow up with your family doctor or cardiologist as they may want to see you later this week to monitor that fluids.       ED Prescriptions    None     Controlled Substance Prescriptions [place] Controlled Substance Registry consulted? Not Applicable   Tyrell Antonio 02/27/19 1050

## 2019-02-27 NOTE — ED Triage Notes (Signed)
Patient was in her bathroom this morning starting to sit on the toilet when she fell face first into the shower track bruising her upper lip, sternum, and both arms. She thinks her walker might have hit the corner of the shower causing her to fall.

## 2019-02-27 NOTE — Telephone Encounter (Signed)
Spoke with daughter and patients weight usually runs around 94-95 lbs, up yesterday to 98. Today weight was 99.6 lbs. Patient did not take extra Lasix and K+ until instructed today. She was at Urgent Care for fall, chest xray done   IMPRESSION: Small left pleural effusion. No edema or consolidation. Stable cardiac silhouette. Pacemaker leads attached to right atrium and right ventricle. Aortic Atherosclerosis (ICD10-I70.0).  Per daughter no shortness of breath but has been feeling weak. Will forward to [place] who had last visit with patient for review.

## 2019-02-27 NOTE — Discharge Instructions (Signed)
°  Please monitor for worsening symptoms including headache, dizziness, nausea/vomiting, confusion, or other new concerning symptoms develop. Call 911 or have your daughter take you to the closest hospital for further evaluation with a head scan.  You were found to have a small amount of fluid on your Left lower lung. Please call to follow up with your family doctor or cardiologist as they may want to see you later this week to monitor that fluids.

## 2019-02-27 NOTE — Telephone Encounter (Signed)
Spoke with daughter and patient only takes Losartan 50 mg once a day. Advised to decrease to Losartan 25 mg once daily. Other changes reviewed with daughter. Daughter is concerned that when Lasix 20 mg once daily has not brought weight down and K Lawrence had her take twice a day for a couple of days. Advised daughter if no decrease in weight tomorrow ok to give her Lasix twice a day tomorrow. If no weight change on Thursday call the office Patient scheduled for office visit with A Duke PA on Friday

## 2019-02-27 NOTE — Telephone Encounter (Signed)
Left message to call back  

## 2019-02-27 NOTE — Telephone Encounter (Signed)
New message   Patient's daughter states that she gave the patient an additional lasix and potassium supplement because the patient fell this morning and had an xray and has fluid in one lungs and she was taken [facility]. Please call to discuss.

## 2019-02-28 ENCOUNTER — Telehealth: Payer: Self-pay | Admitting: *Deleted

## 2019-02-28 DIAGNOSIS — Z96651 Presence of right artificial knee joint: Secondary | ICD-10-CM | POA: Diagnosis not present

## 2019-02-28 DIAGNOSIS — I5033 Acute on chronic diastolic (congestive) heart failure: Secondary | ICD-10-CM | POA: Diagnosis not present

## 2019-02-28 DIAGNOSIS — N189 Chronic kidney disease, unspecified: Secondary | ICD-10-CM | POA: Diagnosis not present

## 2019-02-28 DIAGNOSIS — K219 Gastro-esophageal reflux disease without esophagitis: Secondary | ICD-10-CM | POA: Diagnosis not present

## 2019-02-28 DIAGNOSIS — I2510 Atherosclerotic heart disease of native coronary artery without angina pectoris: Secondary | ICD-10-CM | POA: Diagnosis not present

## 2019-02-28 DIAGNOSIS — I480 Paroxysmal atrial fibrillation: Secondary | ICD-10-CM | POA: Diagnosis not present

## 2019-02-28 DIAGNOSIS — Z85028 Personal history of other malignant neoplasm of stomach: Secondary | ICD-10-CM | POA: Diagnosis not present

## 2019-02-28 DIAGNOSIS — K3184 Gastroparesis: Secondary | ICD-10-CM | POA: Diagnosis not present

## 2019-02-28 DIAGNOSIS — G2581 Restless legs syndrome: Secondary | ICD-10-CM | POA: Diagnosis not present

## 2019-02-28 DIAGNOSIS — Z8553 Personal history of malignant neoplasm of renal pelvis: Secondary | ICD-10-CM | POA: Diagnosis not present

## 2019-02-28 DIAGNOSIS — Z8551 Personal history of malignant neoplasm of bladder: Secondary | ICD-10-CM | POA: Diagnosis not present

## 2019-02-28 DIAGNOSIS — Z950 Presence of cardiac pacemaker: Secondary | ICD-10-CM | POA: Diagnosis not present

## 2019-02-28 DIAGNOSIS — G5791 Unspecified mononeuropathy of right lower limb: Secondary | ICD-10-CM | POA: Diagnosis not present

## 2019-02-28 DIAGNOSIS — Z7901 Long term (current) use of anticoagulants: Secondary | ICD-10-CM | POA: Diagnosis not present

## 2019-02-28 DIAGNOSIS — E039 Hypothyroidism, unspecified: Secondary | ICD-10-CM | POA: Diagnosis not present

## 2019-02-28 DIAGNOSIS — I050 Rheumatic mitral stenosis: Secondary | ICD-10-CM | POA: Diagnosis not present

## 2019-02-28 DIAGNOSIS — I130 Hypertensive heart and chronic kidney disease with heart failure and stage 1 through stage 4 chronic kidney disease, or unspecified chronic kidney disease: Secondary | ICD-10-CM | POA: Diagnosis not present

## 2019-02-28 DIAGNOSIS — Z8673 Personal history of transient ischemic attack (TIA), and cerebral infarction without residual deficits: Secondary | ICD-10-CM | POA: Diagnosis not present

## 2019-02-28 DIAGNOSIS — Z905 Acquired absence of kidney: Secondary | ICD-10-CM | POA: Diagnosis not present

## 2019-02-28 DIAGNOSIS — I272 Pulmonary hypertension, unspecified: Secondary | ICD-10-CM | POA: Diagnosis not present

## 2019-02-28 DIAGNOSIS — H6523 Chronic serous otitis media, bilateral: Secondary | ICD-10-CM | POA: Diagnosis not present

## 2019-02-28 NOTE — Telephone Encounter (Signed)
Pt's daughter called reports that this morning she has a bruise on her head that was not there yesterday at her visit. Pt is on Warfarin. Denies LOC at the time of her fall. Denies N/V or dizziness now.

## 2019-02-28 NOTE — Telephone Encounter (Signed)
Per Dr Assunta Found okay to observe her if no neurologic s/s. If she develops any neurologic s/s take her the ED ASAP. Pt's daughter verbalized understanding.

## 2019-03-01 ENCOUNTER — Telehealth: Payer: Self-pay | Admitting: Physician Assistant

## 2019-03-01 ENCOUNTER — Ambulatory Visit (INDEPENDENT_AMBULATORY_CARE_PROVIDER_SITE_OTHER): Payer: Medicare Other | Admitting: *Deleted

## 2019-03-01 DIAGNOSIS — I495 Sick sinus syndrome: Secondary | ICD-10-CM | POA: Diagnosis not present

## 2019-03-01 LAB — CUP PACEART REMOTE DEVICE CHECK
Battery Impedance: 1691 Ohm
Battery Remaining Longevity: 42 mo
Battery Voltage: 2.78 V
Brady Statistic AP VP Percent: 0 %
Brady Statistic AP VS Percent: 65 %
Brady Statistic AS VP Percent: 0 %
Brady Statistic AS VS Percent: 35 %
Date Time Interrogation Session: 20200625195119
Implantable Lead Implant Date: 20111027
Implantable Lead Implant Date: 20111027
Implantable Lead Location: 753859
Implantable Lead Location: 753860
Implantable Lead Model: 4469
Implantable Lead Model: 4470
Implantable Lead Serial Number: 548229
Implantable Lead Serial Number: 686026
Implantable Pulse Generator Implant Date: 20111027
Lead Channel Impedance Value: 374 Ohm
Lead Channel Impedance Value: 446 Ohm
Lead Channel Pacing Threshold Amplitude: 0.375 V
Lead Channel Pacing Threshold Amplitude: 1.625 V
Lead Channel Pacing Threshold Pulse Width: 0.4 ms
Lead Channel Pacing Threshold Pulse Width: 0.4 ms
Lead Channel Sensing Intrinsic Amplitude: 2.8 mV
Lead Channel Sensing Intrinsic Amplitude: 5.6 mV
Lead Channel Setting Pacing Amplitude: 2 V
Lead Channel Setting Pacing Amplitude: 3.75 V
Lead Channel Setting Pacing Pulse Width: 0.4 ms
Lead Channel Setting Sensing Sensitivity: 2 mV

## 2019-03-01 NOTE — Progress Notes (Signed)
Cardiology Office Note:    Date:  03/02/2019   ID:  [name], DOB Feb 28, 1934, MRN [id number]  PCP:  [name], MD  Cardiologist:  [name], MD   Referring MD: [name], MD   No chief complaint on file. dCHF  History of Present Illness:    [name] is a 85 y.o. 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  She was last seen by Dr. [name] on 01/24/2019.  She had complained of increased palpitations while on steroids and her Cardizem was increased.  She later developed fluid retention and weight gain which resolved with Lasix.  Unfortunately she suffered a fall on 02/27/2019 and was evaluated in the ER.  She denied loss of consciousness.  Daughter asked for a chest x-ray to check her fluid as she had not taken her Lasix.  Chest x-ray revealed a small left pleural effusion, but no vascular congestion.  On the same day, she called our office stating that she has had 5 pound weight gain and she had not taken extra Lasix.  Jory Sims NP recommended 20 mg lasix for fluid retention, decrease losartan to 25 mg BID due to hypotension.   She presents today for follow-up. She is back to her dry weight and lower extremity swelling has resolved. She is sleeping propped up for discomfort after her fall. She is breathing well. No complaints. She does have bruising on her forehead, upper lip, chest, and both arms. No active bleeding. She is feeling well. She denies prodrome prior to her fall. She reports chest soreness when she pushes herself up with her arms since the fall, consistent with musculoskeletal soreness.   Past Medical History:  Diagnosis Date  . Adenocarcinoma of stomach ([place]) 11/11/2015    gastric mass on egd  . Anal fissure   . Anemia    hx 3/17 iron infusion, on aranesp and injected B12 since 11/2015.   [name] Anxiety   . Aortic root dilatation ([place])   . Arthritis    oa  . 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 CAD (coronary artery disease)   . Chronic kidney disease    only has one kidney rt lft rem 03 ca     dr. Risa Grill, bladder cancer 05-04-2018  . Elevated LFTs    hepatic steatosis, none recent  . Gastroparesis   . GERD (gastroesophageal reflux disease)   . History of uterine cancer 1980s   treated with hysterectomy, external radiation and radiation seed implants.   [name] HTN (hypertension)   . Hyperlipemia   . Hypothyroidism   . Iron deficiency anemia due to chronic blood loss 11/24/2015  . LVH (left ventricular hypertrophy) 11/14/2015   Mild, noted on ECHO  . Moderate aortic stenosis   . Neuropathy    right leg  . No blood products 02/27/2016  . Osteomyelitis ([place]) as child  . PAF (paroxysmal atrial fibrillation) ([place])   . PONV (postoperative nausea and vomiting)    long time ago  . Presence of permanent cardiac pacemaker   . Radiation proctitis   . Restless leg syndrome   . Stroke ([place]) 15   partial stroke left legally blind; left eye   . Tachycardia-bradycardia syndrome ([place])    s/p PPM by Dr Doreatha Lew (MDT) 07/03/10  Past Surgical History:  Procedure Laterality Date  . CARDIAC CATHETERIZATION  2008  . CHOLECYSTECTOMY  1970s  . COLONOSCOPY N/A 11/11/2015   Procedure: COLONOSCOPY;  Surgeon: [name], MD;  [facility]: [facility];  Service: Endoscopy;  Laterality: N/A;  . CYSTOSCOPY W/ RETROGRADES Right 05/01/2018   Procedure: CYSTOSCOPY WITH RIGHT RETROGRADE PYELOGRAM;  Surgeon: Lucas Mallow, MD;  [facility]: [facility];  Service: Urology;  Laterality: Right;  . CYSTOSCOPY W/ RETROGRADES Right 07/21/2018   Procedure: CYSTOSCOPY WITH RIGHT RETROGRADE PYELOGRAM;  Surgeon: Alexis Frock, MD;  [facility];  Service: Urology;  Laterality: Right;  .  ESOPHAGOGASTRODUODENOSCOPY N/A 11/11/2015   Procedure: ESOPHAGOGASTRODUODENOSCOPY (EGD);  Surgeon: [name], MD;  Location: [facility];  Service: Endoscopy;  Laterality: N/A;  . ESOPHAGOGASTRODUODENOSCOPY N/A 02/05/2016   Procedure: ESOPHAGOGASTRODUODENOSCOPY (EGD);  Surgeon: Ladene Artist, MD;  Location: [facility];  Service: Endoscopy;  Laterality: N/A;  . ESOPHAGOGASTRODUODENOSCOPY N/A 04/02/2016   Procedure: ESOPHAGOGASTRODUODENOSCOPY (EGD);  Surgeon: [name], MD;  Location: [facility];  Service: Endoscopy;  Laterality: N/A;  . ESOPHAGOGASTRODUODENOSCOPY (EGD) WITH PROPOFOL N/A 04/06/2017   Procedure: ESOPHAGOGASTRODUODENOSCOPY (EGD) WITH PROPOFOL;  Surgeon: [name], MD;  [facility];  Service: Endoscopy;  Laterality: N/A;  . GASTRECTOMY N/A 01/15/2016   Procedure: OPEN PARTIAL GASTRECTOMY;  Surgeon: Stark Klein, MD;  Location: [place];  Service: General;  Laterality: N/A;  . GASTRECTOMY Left 02/24/2016   Procedure: OPEN J TUBE;  Surgeon: Stark Klein, MD;  Location: [place];  Service: General;  Laterality: Left;  [name] GASTROJEJUNOSTOMY N/A 05/18/2016   Procedure: ROUX EN Y GASTROJEJUNOSTOMY;  Surgeon: Stark Klein, MD;  Location: [place];  Service: General;  Laterality: N/A;  . I&D KNEE WITH POLY EXCHANGE Right 12/17/2013   Procedure: IRRIGATION AND DEBRIDEMEN RIGHT TOTAL  KNEE WITH POLY EXCHANGE;  Surgeon: Mauri Pole, MD;  [facility];  Service: Orthopedics;  Laterality: Right;  . IR GENERIC HISTORICAL  06/17/2016   IR RADIOLOGIST EVAL & MGMT 06/17/2016 Ascencion Dike, PA-C [facility] INTERV RAD  . LAPAROSCOPY N/A 01/15/2016   Procedure: LAPAROSCOPY DIAGNOSTIC;  Surgeon: Stark Klein, MD;  Location: [place];  Service: General;  Laterality: N/A;  . LYSIS OF ADHESION  05/18/2016   Procedure: LYSIS OF ADHESION;  Surgeon: Stark Klein, MD;  Location: [facility];  Service: General;;  . NEPHRECTOMY Left    renal cell cancer  . PACEMAKER INSERTION  07/04/2011   MDT PPM  implanted by Dr Doreatha Lew for tachy/brady; managed by Dr Thompson Grayer   . PATELLAR TENDON REPAIR Right 03/06/2014   Procedure: AVULSION WITH PATELLA TENDON REPAIR;  Surgeon: Mauri Pole, MD;  [facility];  Service: Orthopedics;  Laterality: Right;  . PATENT DUCTUS ARTERIOUS REPAIR  ~ 1949  . TOTAL ABDOMINAL HYSTERECTOMY  85  . TOTAL HIP REVISION Right 2009   initial replacment surg  . TOTAL KNEE ARTHROPLASTY  02/07/2012   Procedure: TOTAL KNEE ARTHROPLASTY;  Surgeon: Mauri Pole, MD;  [facility];  Service: Orthopedics;  Laterality: Right;  . TOTAL KNEE REVISION Right 07/29/2014   Procedure: RIGHT KNEE REVISION OF PREVIOUS REPAIR EXTENSOR MECHANISM;  Surgeon: Mauri Pole, MD;  [facility];  Service: Orthopedics;  Laterality: Right;  . TRANSURETHRAL RESECTION OF BLADDER TUMOR N/A 07/21/2018   Procedure: TRANSURETHRAL RESECTION OF BLADDER TUMOR (TURBT);  Surgeon: Alexis Frock, MD;  [facility];  Service: Urology;  Laterality: N/A;  . TRANSURETHRAL RESECTION OF BLADDER TUMOR WITH MITOMYCIN-C  N/A 05/01/2018   Procedure: TRANSURETHRAL RESECTION OF BLADDER TUMOR WITH POST OP INSTILLATION OF GEMCITABINE;  Surgeon: Lucas Mallow, MD;  [facility];  Service: Urology;  Laterality: N/A;  . WHIPPLE PROCEDURE N/A 05/18/2016   Procedure: EXPLORATORY LAPAROTOMY;  Surgeon: Stark Klein, MD;  Location: [place];  Service: General;  Laterality: N/A;    Current Medications: Current Meds  Medication Sig  . acetaminophen (TYLENOL) 500 MG tablet Take 500-1,000 mg by mouth every 6 (six) hours as needed for mild pain or headache.   . Biotin 10 MG CAPS Take 10 mg by mouth daily.   . cetirizine (ZYRTEC) 10 MG tablet Take 10 mg by mouth daily.  . Cholecalciferol (VITAMIN D-3) 5000 units TABS Take 5,000 Units by mouth 3 (three) times daily.   . famotidine (PEPCID) 40 MG tablet Take 1 tablet (40 mg total) by mouth daily.  [name] gabapentin (NEURONTIN) 300 MG capsule Take 1 capsule by mouth 2  (two) times a day.  . IRON, FERROUS SULFATE, PO Take 1 tablet by mouth daily.  [name] levothyroxine (SYNTHROID) 100 MCG tablet TAKE 1/2 TABLET BY MOUTH DAILY BEFORE BREAKFAST.  [name] loperamide (IMODIUM A-D) 2 MG tablet Take up to 12 tablets /day as needed for  for Diarrhea  . losartan (COZAAR) 25 MG tablet Take 2 tablets (50 mg total) by mouth daily.  . metoprolol tartrate (LOPRESSOR) 100 MG tablet Take 0.5 tablets (50 mg total) by mouth 2 (two) times daily.  . nitroGLYCERIN (NITROSTAT) 0.4 MG SL tablet Place 1 tablet (0.4 mg total) under the tongue every 5 (five) minutes as needed for chest pain (MAX 3 TABLETS).  . ondansetron (ZOFRAN-ODT) 8 MG disintegrating tablet Take 8 mg by mouth every 8 (eight) hours as needed for nausea or vomiting.   . potassium chloride (K-DUR) 10 MEQ tablet Take 10 mEq by mouth daily.  . simethicone (MYLICON) 80 MG chewable tablet Chew 80 mg by mouth every 6 (six) hours as needed for flatulence.  . sucralfate (CARAFATE) 1 g tablet TAKE 1 TABLET (1 G TOTAL) BY MOUTH 4 (FOUR) TIMES DAILY - WITH MEALS AND AT BEDTIME. (Patient taking differently: Take 1 g by mouth 3 (three) times daily. )  . verapamil (CALAN-SR) 120 MG CR tablet Take 1 tablet 2 x /day with a meal for BP & Heart Rhythm  . warfarin (COUMADIN) 2 MG tablet TAKE 1 TABLET BY MOUTH DAILY OR AS DIRECTED (Patient taking differently: TAKE 1 TABLET BY MOUTH DAILY OR AS DIRECTED)     Allergies:   Adhesive [tape], Amiodarone, Hydrocodone, Remeron [mirtazapine], Codeine, Morphine and related, Reglan [metoclopramide], Requip [ropinirole hcl], Zinc, and Other   Social History   Socioeconomic History  . Marital status: Widowed    Spouse name: Not on file  . Number of children: Not on file  . Years of education: Not on file  . Highest education level: Not on file  Occupational History  . Not on file  Social Needs  . Financial resource strain: Not on file  . Food insecurity    Worry: Not on file    Inability: Not on file   . Transportation needs    Medical: Not on file    Non-medical: Not on file  Tobacco Use  . Smoking status: Never Smoker  . Smokeless tobacco: Never Used  Substance and Sexual Activity  . Alcohol use: No  . Drug use: No  . Sexual activity: Never  Lifestyle  . Physical activity  Days per week: Not on file    Minutes per session: Not on file  . Stress: Not on file  Relationships  . Social Herbalist on phone: Not on file    Gets together: Not on file    Attends religious service: Not on file    Active member of club or organization: Not on file    Attends meetings of clubs or organizations: Not on file    Relationship status: Not on file  Other Topics Concern  . Not on file  Social History Narrative   01/01/19 Lives in [place] w/dgtr, [name].  Continues to work.     Family History: The patient's family history includes Colon cancer (age of onset: 30) in her mother; Heart attack in her father; Heart disease in her father, maternal grandmother, and mother.  ROS:   Please see the history of present illness.     All other systems reviewed and are negative.  EKGs/Labs/Other Studies Reviewed:    The following studies were reviewed today:  Echo 11/14/15: Study Conclusions  - Left ventricle: The cavity size was normal. There was mild   concentric hypertrophy. Systolic function was normal. The   estimated ejection fraction was in the range of 60% to 65%. Wall   motion was normal; there were no regional wall motion   abnormalities. The study was not technically sufficient to allow   evaluation of LV diastolic dysfunction due to atrial   fibrillation. - Aortic valve: Valve mobility was restricted. There was mild to   moderate stenosis. There was moderate regurgitation. Peak   gradient (S): 38 mm Hg. Valve area (VTI): 0.55 cm^2. Valve area   (Vmax): 0.57 cm^2. Valve area (Vmean): 0.59 cm^2. - Aortic root: The aortic root was normal in size. - Ascending  aorta: The ascending aorta was normal in size. - Mitral valve: Calcified annulus. Moderately thickened, severely   calcified leaflets . Mobility was restricted. The findings are   consistent with moderate stenosis. There was mild regurgitation.   Valve area by continuity equation (using LVOT flow): 0.82 cm^2. - Left atrium: The atrium was mildly dilated. - Right ventricle: The cavity size was mildly dilated. Wall   thickness was normal. Systolic function was mildly reduced. - Right atrium: The atrium was mildly dilated. - Tricuspid valve: There was moderate regurgitation. - Pulmonary arteries: Systolic pressure was moderately to severely   increased. PA peak pressure: 55 mm Hg (S). - Inferior vena cava: The vessel was dilated. The respirophasic   diameter changes were blunted (< 50%), consistent with elevated   central venous pressure. - Pericardium, extracardiac: There was no pericardial effusion.  EKG:  EKG is not ordered today.    Recent Labs: 01/30/2019: ALT 55; BUN 17; Creat 0.94; Hemoglobin 12.6; Magnesium 1.5; Platelets 208; Potassium 3.7; Sodium 143; TSH 1.04  Recent Lipid Panel    Component Value Date/Time   CHOL 132 01/30/2019 1416   TRIG 96 01/30/2019 1416   HDL 36 (L) 01/30/2019 1416   CHOLHDL 3.7 01/30/2019 1416   VLDL 29 03/23/2017 1445   LDLCALC 78 01/30/2019 1416    Physical Exam:    VS:  BP (!) 174/82   Pulse 75   Ht 4\' 11"  (1.499 m)   Wt 95 lb 9.6 oz (43.4 kg)   BMI 19.31 kg/m     Wt Readings from Last 3 Encounters:  03/02/19 95 lb 9.6 oz (43.4 kg)  01/30/19 99 lb 9.6 oz (45.2 kg)  01/24/19 93 lb 6.4 oz (42.4 kg)     GEN: Well nourished, well developed in no acute distress HEENT: Normal NECK: No JVD; No carotid bruits LYMPHATICS: No lymphadenopathy CARDIAC: irregular rhythm, regular rate, systolic murmur RESPIRATORY:  Clear to auscultation without rales, wheezing or rhonchi  ABDOMEN: Soft, non-tender, non-distended MUSCULOSKELETAL:  Left LE edema  that is her baseline after multiple surgeries; No deformity  SKIN: Warm and dry NEUROLOGIC:  Alert and oriented x 3 PSYCHIATRIC:  Normal affect   ASSESSMENT:    1. Acute on chronic diastolic heart failure ([place])   2. Permanent atrial fibrillation   3. Essential hypertension   4. Medication management    PLAN:    In order of problems listed above:  Acute on chronic diastolic heart failure ([place])  She is back to baseline and near euvolemic today. May have had a mild viral illness preceding her exacerbation. Will D/C lasix and resume 50 mg losartan. I will check renal function today given her solitary kidney.  Permanent atrial fibrillation On coumadin. Stable. No active bleeding.   Essential hypertension  Pressure elevated today. Was 145/70 on recheck. Medication changes as above.   Medication management    Follow up with Dr. [name] in 6 months.   Medication Adjustments/Labs and Tests Ordered: Current medicines are reviewed at length with the patient today.  Concerns regarding medicines are outlined above.  Orders Placed This Encounter  Procedures  . Basic metabolic panel  . Pro b natriuretic peptide (BNP)   No orders of the defined types were placed in this encounter.   Signed, Ledora Bottcher, PA  03/02/2019 4:19 PM    [facility]

## 2019-03-01 NOTE — Telephone Encounter (Signed)
LVM reminding pt of her appt on 03-02-19 with Fabian Sharp.

## 2019-03-02 ENCOUNTER — Ambulatory Visit (INDEPENDENT_AMBULATORY_CARE_PROVIDER_SITE_OTHER): Payer: Medicare Other | Admitting: *Deleted

## 2019-03-02 ENCOUNTER — Encounter: Payer: Self-pay | Admitting: Physician Assistant

## 2019-03-02 ENCOUNTER — Other Ambulatory Visit: Payer: Self-pay

## 2019-03-02 ENCOUNTER — Ambulatory Visit (INDEPENDENT_AMBULATORY_CARE_PROVIDER_SITE_OTHER): Payer: Medicare Other | Admitting: Physician Assistant

## 2019-03-02 VITALS — BP 174/82 | HR 75 | Ht 59.0 in | Wt 95.6 lb

## 2019-03-02 DIAGNOSIS — Z7901 Long term (current) use of anticoagulants: Secondary | ICD-10-CM | POA: Diagnosis not present

## 2019-03-02 DIAGNOSIS — I4821 Permanent atrial fibrillation: Secondary | ICD-10-CM

## 2019-03-02 DIAGNOSIS — I10 Essential (primary) hypertension: Secondary | ICD-10-CM | POA: Diagnosis not present

## 2019-03-02 DIAGNOSIS — I5033 Acute on chronic diastolic (congestive) heart failure: Secondary | ICD-10-CM

## 2019-03-02 DIAGNOSIS — Z79899 Other long term (current) drug therapy: Secondary | ICD-10-CM | POA: Diagnosis not present

## 2019-03-02 DIAGNOSIS — I480 Paroxysmal atrial fibrillation: Secondary | ICD-10-CM

## 2019-03-02 LAB — POCT INR: INR: 3.3 — AB (ref 2.0–3.0)

## 2019-03-02 NOTE — Patient Instructions (Addendum)
Description   Do not take any Coumadin today then start taking 1 tablet daily. Recheck INR in 2 weeks. Call us with any medication changes or concerns # [phone].

## 2019-03-02 NOTE — Patient Instructions (Signed)
Medication Instructions:  Doreene Adas, PA has recommended making the following medication changes: 1. STOP Furosemide 2. INCREASE Losartan back to 50 mg (2 tablets) daily  If you need a refill on your cardiac medications before your next appointment, please call your pharmacy.   Lab work: Your physician recommends that you return for lab work TODAY.  If you have labs (blood work) drawn today and your tests are completely normal, you will receive your results only by: [name] MyChart Message (if you have MyChart) OR . A paper copy in the mail If you have any lab test that is abnormal or we need to change your treatment, we will call you to review the results.  Follow-Up: At [facility], you and your health needs are our priority.  As part of our continuing mission to provide you with exceptional heart care, we have created designated Provider Care Teams.  These Care Teams include your primary Cardiologist (physician) and Advanced Practice Providers (APPs -  Physician Assistants and Nurse Practitioners) who all work together to provide you with the care you need, when you need it. You will need a follow up appointment in 6 months.  Please call our office 2 months in advance to schedule this appointment.  You may see [name], MD or one of the following Advanced Practice Providers on your designated Care Team: [place], [place] . Fabian Sharp, PA-C . You will receive a reminder letter in the mail two months in advance. If you don't receive a letter, please call our office to schedule the follow-up appointment.

## 2019-03-03 LAB — BASIC METABOLIC PANEL
BUN/Creatinine Ratio: 13 (ref 12–28)
BUN: 13 mg/dL (ref 8–27)
CO2: 24 mmol/L (ref 20–29)
Calcium: 8.9 mg/dL (ref 8.7–10.3)
Chloride: 107 mmol/L — ABNORMAL HIGH (ref 96–106)
Creatinine, Ser: 0.97 mg/dL (ref 0.57–1.00)
GFR calc Af Amer: 62 mL/min/{1.73_m2} (ref >59)
GFR calc non Af Amer: 54 mL/min/{1.73_m2} — ABNORMAL LOW (ref >59)
Glucose: 110 mg/dL — ABNORMAL HIGH (ref 65–99)
Potassium: 4.1 mmol/L (ref 3.5–5.2)
Sodium: 147 mmol/L — ABNORMAL HIGH (ref 134–144)

## 2019-03-03 LAB — PRO B NATRIURETIC PEPTIDE: NT-Pro BNP: 10870 pg/mL — ABNORMAL HIGH (ref 0–738)

## 2019-03-05 DIAGNOSIS — K219 Gastro-esophageal reflux disease without esophagitis: Secondary | ICD-10-CM | POA: Diagnosis not present

## 2019-03-05 DIAGNOSIS — N189 Chronic kidney disease, unspecified: Secondary | ICD-10-CM | POA: Diagnosis not present

## 2019-03-05 DIAGNOSIS — Z8673 Personal history of transient ischemic attack (TIA), and cerebral infarction without residual deficits: Secondary | ICD-10-CM | POA: Diagnosis not present

## 2019-03-05 DIAGNOSIS — H6523 Chronic serous otitis media, bilateral: Secondary | ICD-10-CM | POA: Diagnosis not present

## 2019-03-05 DIAGNOSIS — I130 Hypertensive heart and chronic kidney disease with heart failure and stage 1 through stage 4 chronic kidney disease, or unspecified chronic kidney disease: Secondary | ICD-10-CM | POA: Diagnosis not present

## 2019-03-05 DIAGNOSIS — K3184 Gastroparesis: Secondary | ICD-10-CM | POA: Diagnosis not present

## 2019-03-05 DIAGNOSIS — Z7901 Long term (current) use of anticoagulants: Secondary | ICD-10-CM | POA: Diagnosis not present

## 2019-03-05 DIAGNOSIS — G2581 Restless legs syndrome: Secondary | ICD-10-CM | POA: Diagnosis not present

## 2019-03-05 DIAGNOSIS — Z96651 Presence of right artificial knee joint: Secondary | ICD-10-CM | POA: Diagnosis not present

## 2019-03-05 DIAGNOSIS — I050 Rheumatic mitral stenosis: Secondary | ICD-10-CM | POA: Diagnosis not present

## 2019-03-05 DIAGNOSIS — I480 Paroxysmal atrial fibrillation: Secondary | ICD-10-CM | POA: Diagnosis not present

## 2019-03-05 DIAGNOSIS — Z950 Presence of cardiac pacemaker: Secondary | ICD-10-CM | POA: Diagnosis not present

## 2019-03-05 DIAGNOSIS — Z85028 Personal history of other malignant neoplasm of stomach: Secondary | ICD-10-CM | POA: Diagnosis not present

## 2019-03-05 DIAGNOSIS — I272 Pulmonary hypertension, unspecified: Secondary | ICD-10-CM | POA: Diagnosis not present

## 2019-03-05 DIAGNOSIS — E039 Hypothyroidism, unspecified: Secondary | ICD-10-CM | POA: Diagnosis not present

## 2019-03-05 DIAGNOSIS — I2510 Atherosclerotic heart disease of native coronary artery without angina pectoris: Secondary | ICD-10-CM | POA: Diagnosis not present

## 2019-03-05 DIAGNOSIS — Z8553 Personal history of malignant neoplasm of renal pelvis: Secondary | ICD-10-CM | POA: Diagnosis not present

## 2019-03-05 DIAGNOSIS — G5791 Unspecified mononeuropathy of right lower limb: Secondary | ICD-10-CM | POA: Diagnosis not present

## 2019-03-05 DIAGNOSIS — Z905 Acquired absence of kidney: Secondary | ICD-10-CM | POA: Diagnosis not present

## 2019-03-05 DIAGNOSIS — I5033 Acute on chronic diastolic (congestive) heart failure: Secondary | ICD-10-CM | POA: Diagnosis not present

## 2019-03-05 DIAGNOSIS — Z8551 Personal history of malignant neoplasm of bladder: Secondary | ICD-10-CM | POA: Diagnosis not present

## 2019-03-09 ENCOUNTER — Encounter: Payer: Self-pay | Admitting: Cardiology

## 2019-03-09 NOTE — Progress Notes (Signed)
Remote pacemaker transmission.   

## 2019-03-13 ENCOUNTER — Other Ambulatory Visit: Payer: Self-pay

## 2019-03-13 ENCOUNTER — Encounter: Payer: Self-pay | Admitting: Diagnostic Neuroimaging

## 2019-03-13 ENCOUNTER — Ambulatory Visit: Payer: Medicare Other | Admitting: Diagnostic Neuroimaging

## 2019-03-13 ENCOUNTER — Other Ambulatory Visit: Payer: Self-pay | Admitting: Internal Medicine

## 2019-03-13 VITALS — BP 195/94 | HR 82 | Temp 98.0°F | Ht 59.0 in | Wt 97.2 lb

## 2019-03-13 DIAGNOSIS — I10 Essential (primary) hypertension: Secondary | ICD-10-CM

## 2019-03-13 DIAGNOSIS — M545 Low back pain, unspecified: Secondary | ICD-10-CM

## 2019-03-13 DIAGNOSIS — R200 Anesthesia of skin: Secondary | ICD-10-CM | POA: Diagnosis not present

## 2019-03-13 DIAGNOSIS — G8929 Other chronic pain: Secondary | ICD-10-CM | POA: Diagnosis not present

## 2019-03-13 MED ORDER — METOPROLOL TARTRATE 100 MG PO TABS: ORAL_TABLET | ORAL | 6 refills | Status: DC

## 2019-03-13 NOTE — Progress Notes (Signed)
[facility]  PATIENT: [name] DOB: 04-20-1934  REFERRING CLINICIAN: Essie Christine HISTORY FROM: patient and daughter  REASON FOR VISIT: new consult    HISTORICAL  CHIEF COMPLAINT:  Chief Complaint  Patient presents with  . Neuropathy, falls    rm 7 New Pt, dgtr- Brenda    HISTORY OF PRESENT ILLNESS:   85 year old female here for evaluation of right leg numbness, recurrent falls, gait difficulty.  When patient was 85 years old she had osteomyelitis of the right knee and leg.  This required multiple surgeries.  This led to restricted range of motion of the right knee and leg.  Then in 2015 patient had right patellar tendon repair, complicated by postoperative infection requiring incision and drainage.  In the 1990s patient had uterine cancer, treated with radiation, resulting in bilateral lower extremity neuropathy.  She also had kidney cancer in 2003, gastric cancer 2017, bladder cancer 2019.  She has had multiple surgeries and treatments for these.  In February 2020 patient had a severe fall where she slipped and fell.  Ever since that time she has had increasing numbness and pain in her right leg.  This worsens when she stands up for a long time.  She went to orthopedic surgery for evaluation who did x-rays of the hip and knee on the right side and then return to PCP.  She is not had imaging or evaluation of low back.  She does not want to go through any additional surgeries for the low back.  Patient uses a cane and walker for balance.    REVIEW OF SYSTEMS: Full 14 system review of systems performed and negative with exception of: As per HPI.  ALLERGIES: Allergies  Allergen Reactions  . Adhesive [Tape] Other (See Comments)    Tears skin, Please use "paper" tape  . Amiodarone Other (See Comments)     Thyroid and liver and kidney problems  . Hydrocodone Other (See Comments)    HALLLUCINATIONS  . Remeron [Mirtazapine] Other (See Comments)   Hallucinations and make pt loopy  . Codeine Nausea And Vomiting  . Morphine And Related Nausea And Vomiting  . Reglan [Metoclopramide] Hives, Itching and Rash  . Requip [Ropinirole Hcl] Other (See Comments)    Headache   . Zinc Nausea Only  . Other     NO BLOOD PRODUCTS    HOME MEDICATIONS: Outpatient Medications Prior to Visit  Medication Sig Dispense Refill  . acetaminophen (TYLENOL) 500 MG tablet Take 500-1,000 mg by mouth every 6 (six) hours as needed for mild pain or headache.     . Biotin 10 MG CAPS Take 10 mg by mouth daily.     . cetirizine (ZYRTEC) 10 MG tablet Take 10 mg by mouth daily.    . Cholecalciferol (VITAMIN D-3) 5000 units TABS Take 5,000 Units by mouth 3 (three) times daily.     . famotidine (PEPCID) 40 MG tablet Take 1 tablet (40 mg total) by mouth daily. 90 tablet 3  . gabapentin (NEURONTIN) 300 MG capsule Take 1 capsule by mouth 2 (two) times a day.    . IRON, FERROUS SULFATE, PO Take 1 tablet by mouth daily.    [name] levothyroxine (SYNTHROID) 100 MCG tablet TAKE 1/2 TABLET BY MOUTH DAILY BEFORE BREAKFAST. 45 tablet 1  . loperamide (IMODIUM A-D) 2 MG tablet Take up to 12 tablets /day as needed for  for Diarrhea 120 tablet 11  . losartan (COZAAR) 25 MG tablet 50 mg daily.    [name]  metoprolol tartrate (LOPRESSOR) 100 MG tablet Take 0.5 tablets (50 mg total) by mouth 2 (two) times daily. 30 tablet 6  . nitroGLYCERIN (NITROSTAT) 0.4 MG SL tablet Place 1 tablet (0.4 mg total) under the tongue every 5 (five) minutes as needed for chest pain (MAX 3 TABLETS). 25 tablet prn  . ondansetron (ZOFRAN-ODT) 8 MG disintegrating tablet Take 8 mg by mouth every 8 (eight) hours as needed for nausea or vomiting.     . potassium chloride (K-DUR) 10 MEQ tablet Take 10 mEq by mouth daily.    . simethicone (MYLICON) 80 MG chewable tablet Chew 80 mg by mouth every 6 (six) hours as needed for flatulence.    . sucralfate (CARAFATE) 1 g tablet TAKE 1 TABLET (1 G TOTAL) BY MOUTH 4 (FOUR) TIMES DAILY  - WITH MEALS AND AT BEDTIME. (Patient taking differently: Take 1 g by mouth 3 (three) times daily. ) 360 tablet 3  . verapamil (CALAN-SR) 120 MG CR tablet Take 1 tablet 2 x /day with a meal for BP & Heart Rhythm 180 tablet 3  . warfarin (COUMADIN) 2 MG tablet TAKE 1 TABLET BY MOUTH DAILY OR AS DIRECTED (Patient taking differently: TAKE 1 TABLET BY MOUTH DAILY OR AS DIRECTED) 90 tablet 1  . losartan (COZAAR) 25 MG tablet Take 2 tablets (50 mg total) by mouth daily. 90 tablet 1   No facility-administered medications prior to visit.     PAST MEDICAL HISTORY: Past Medical History:  Diagnosis Date  . Adenocarcinoma of stomach ([place]) 11/11/2015   gastric mass on egd  . Anal fissure   . Anemia    hx 3/17 iron infusion, on aranesp and injected B12 since 11/2015.   [name] Anxiety   . Aortic root dilatation ([place])   . Arthritis    oa  . 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 CAD (coronary artery disease)   . Chronic kidney disease    only has one kidney rt lft rem 03 ca     dr. Risa Grill, bladder cancer 05-04-2018  . Elevated LFTs    hepatic steatosis, none recent  . Gastroparesis   . GERD (gastroesophageal reflux disease)   . History of uterine cancer 1980s   treated with hysterectomy, external radiation and radiation seed implants.   [name] HTN (hypertension)   . Hyperlipemia   . Hypothyroidism   . Iron deficiency anemia due to chronic blood loss 11/24/2015  . LVH (left ventricular hypertrophy) 11/14/2015   Mild, noted on ECHO  . Moderate aortic stenosis   . Neuropathy    right leg  . No blood products 02/27/2016  . Osteomyelitis ([place]) as child  . PAF (paroxysmal atrial fibrillation) ([place])   . PONV (postoperative nausea and vomiting)    long time ago  . Presence of permanent cardiac pacemaker   . Radiation proctitis   . Restless leg syndrome   . Stroke ([place]) 15   partial stroke left legally blind; left eye   . Tachycardia-bradycardia syndrome ([place])    s/p PPM by Dr Doreatha Lew (MDT) 07/03/10    PAST SURGICAL  HISTORY: Past Surgical History:  Procedure Laterality Date  . CARDIAC CATHETERIZATION  2008  . CHOLECYSTECTOMY  1970s  . COLONOSCOPY N/A 11/11/2015   Procedure: COLONOSCOPY;  Surgeon: [name], MD;  Location: [facility];  Service: Endoscopy;  Laterality: N/A;  . CYSTOSCOPY W/ RETROGRADES Right 05/01/2018   Procedure: CYSTOSCOPY WITH RIGHT RETROGRADE PYELOGRAM;  Surgeon: Lucas Mallow, MD;  Location: [facility];  Service: Urology;  Laterality: Right;  . CYSTOSCOPY W/ RETROGRADES Right 07/21/2018   Procedure: CYSTOSCOPY WITH RIGHT RETROGRADE PYELOGRAM;  Surgeon: Alexis Frock, MD;  Location: [facility];  Service: Urology;  Laterality: Right;  . ESOPHAGOGASTRODUODENOSCOPY N/A 11/11/2015   Procedure: ESOPHAGOGASTRODUODENOSCOPY (EGD);  Surgeon: [name], MD;  Location: [facility];  Service: Endoscopy;  Laterality: N/A;  . ESOPHAGOGASTRODUODENOSCOPY N/A 02/05/2016   Procedure: ESOPHAGOGASTRODUODENOSCOPY (EGD);  Surgeon: Ladene Artist, MD;  Location: [facility] END[facility]COPY;  Service: Endoscopy;  Laterality: N/A;  . ESOPHAGOGASTRODUODENOSCOPY N/A 04/02/2016   Procedure: ESOPHAGOGASTRODUODENOSCOPY (EGD);  Surgeon: [name], MD;  Location: [facility];  Service: Endoscopy;  Laterality: N/A;  . ESOPHAGOGASTRODUODENOSCOPY (EGD) WITH PROPOFOL N/A 04/06/2017   Procedure: ESOPHAGOGASTRODUODENOSCOPY (EGD) WITH PROPOFOL;  Surgeon: [name], MD;  [facility];  Service: Endoscopy;  Laterality: N/A;  . GASTRECTOMY N/A 01/15/2016   Procedure: OPEN PARTIAL GASTRECTOMY;  Surgeon: Stark Klein, MD;  Location: [facility];  Service: General;  Laterality: N/A;  . GASTRECTOMY Left 02/24/2016   Procedure: OPEN J TUBE;  Surgeon: Stark Klein, MD;  Location: [facility];  Service: General;  Laterality: Left;  [name] GASTROJEJUNOSTOMY N/A 05/18/2016   Procedure: ROUX EN Y GASTROJEJUNOSTOMY;  Surgeon: Stark Klein, MD;  Location: [facility];  Service: General;  Laterality: N/A;  . I&D KNEE WITH POLY EXCHANGE Right 12/17/2013    Procedure: IRRIGATION AND DEBRIDEMEN RIGHT TOTAL  KNEE WITH POLY EXCHANGE;  Surgeon: Mauri Pole, MD;  [facility];  Service: Orthopedics;  Laterality: Right;  . IR GENERIC HISTORICAL  06/17/2016   IR RADIOLOGIST EVAL & MGMT 06/17/2016 Ascencion Dike, PA-C [facility] INTERV RAD  . LAPAROSCOPY N/A 01/15/2016   Procedure: LAPAROSCOPY DIAGNOSTIC;  Surgeon: Stark Klein, MD;  Location: [facility];  Service: General;  Laterality: N/A;  . LYSIS OF ADHESION  05/18/2016   Procedure: LYSIS OF ADHESION;  Surgeon: Stark Klein, MD;  Location: [facility];  Service: General;;  . NEPHRECTOMY Left    renal cell cancer  . PACEMAKER INSERTION  07/04/2011   MDT PPM implanted by Dr Doreatha Lew for tachy/brady; managed by Dr Thompson Grayer   . PATELLAR TENDON REPAIR Right 03/06/2014   Procedure: AVULSION WITH PATELLA TENDON REPAIR;  Surgeon: Mauri Pole, MD;  [facility];  Service: Orthopedics;  Laterality: Right;  . PATENT DUCTUS ARTERIOUS REPAIR  ~ 1949  . TOTAL ABDOMINAL HYSTERECTOMY  85  . TOTAL HIP REVISION Right 2009   initial replacment surg  . TOTAL KNEE ARTHROPLASTY  02/07/2012   Procedure: TOTAL KNEE ARTHROPLASTY;  Surgeon: Mauri Pole, MD;  [facility];  Service: Orthopedics;  Laterality: Right;  . TOTAL KNEE REVISION Right 07/29/2014   Procedure: RIGHT KNEE REVISION OF PREVIOUS REPAIR EXTENSOR MECHANISM;  Surgeon: Mauri Pole, MD;  [facility];  Service: Orthopedics;  Laterality: Right;  . TRANSURETHRAL RESECTION OF BLADDER TUMOR N/A 07/21/2018   Procedure: TRANSURETHRAL RESECTION OF BLADDER TUMOR (TURBT);  Surgeon: Alexis Frock, MD;  [facility];  Service: Urology;  Laterality: N/A;  . TRANSURETHRAL RESECTION OF BLADDER TUMOR WITH MITOMYCIN-C N/A 05/01/2018   Procedure: TRANSURETHRAL RESECTION OF BLADDER TUMOR WITH POST OP INSTILLATION OF GEMCITABINE;  Surgeon: Lucas Mallow, MD;  [facility];  Service: Urology;  Laterality: N/A;  . WHIPPLE PROCEDURE N/A 05/18/2016    Procedure: EXPLORATORY LAPAROTOMY;  Surgeon: Stark Klein, MD;  [facility];  Service: General;  Laterality: N/A;    FAMILY HISTORY: Family History  Problem Relation Age  of Onset  . Colon cancer Mother 26  . Heart disease Mother   . Heart disease Father   . Heart attack Father   . Heart disease Maternal Grandmother     SOCIAL HISTORY: Social History   Socioeconomic History  . Marital status: Widowed    Spouse name: Not on file  . Number of children: 3  . Years of education: Not on file  . Highest education level: Not on file  Occupational History  . Not on file  Social Needs  . Financial resource strain: Not on file  . Food insecurity    Worry: Not on file    Inability: Not on file  . Transportation needs    Medical: Not on file    Non-medical: Not on file  Tobacco Use  . Smoking status: Never Smoker  . Smokeless tobacco: Never Used  Substance and Sexual Activity  . Alcohol use: No  . Drug use: No  . Sexual activity: Never  Lifestyle  . Physical activity    Days per week: Not on file    Minutes per session: Not on file  . Stress: Not on file  Relationships  . Social Herbalist on phone: Not on file    Gets together: Not on file    Attends religious service: Not on file    Active member of club or organization: Not on file    Attends meetings of clubs or organizations: Not on file    Relationship status: Not on file  . Intimate partner violence    Fear of current or ex partner: Not on file    Emotionally abused: Not on file    Physically abused: Not on file    Forced sexual activity: Not on file  Other Topics Concern  . Not on file  Social History Narrative   01/01/19 Lives in [place] w/dgtr, [name].  Continues to work.     PHYSICAL EXAM  GENERAL EXAM/CONSTITUTIONAL: Vitals:  Vitals:   03/13/19 1307 03/13/19 1500  BP: (!) 207/97 (!) 195/94  Pulse: 82   Temp: 98 F (36.7 C)   Weight: 97 lb 3.2 oz (44.1 kg)   Height: 4\' 11"   (1.499 m)     Body mass index is 19.63 kg/m. Wt Readings from Last 3 Encounters:  03/13/19 97 lb 3.2 oz (44.1 kg)  03/02/19 95 lb 9.6 oz (43.4 kg)  01/30/19 99 lb 9.6 oz (45.2 kg)     Patient is in no distress; well developed, nourished and groomed; neck is supple  CARDIOVASCULAR:  Examination of carotid arteries is normal; no carotid bruits  Regular rate and rhythm, no murmurs  Examination of peripheral vascular system by observation and palpation is normal  EYES:  Ophthalmoscopic exam of optic discs and posterior segments is normal; no papilledema or hemorrhages  No exam data present  MUSCULOSKELETAL:  Gait, strength, tone, movements noted in Neurologic exam below  NEUROLOGIC: MENTAL STATUS:  No flowsheet data found.  awake, alert, oriented to person, place and time  recent and remote memory intact  normal attention and concentration  language fluent, comprehension intact, naming intact  fund of knowledge appropriate  CRANIAL NERVE:   2nd - no papilledema on fundoscopic exam  2nd, 3rd, 4th, 6th - pupils equal and reactive to light, visual fields full to confrontation, extraocular muscles intact, no nystagmus  5th - facial sensation symmetric  7th - facial strength symmetric  8th - hearing intact  9th - palate  elevates symmetrically, uvula midline  11th - shoulder shrug symmetric  12th - tongue protrusion midline  MOTOR:   normal bulk and tone, full strength in the BUE, BLE; EXCEPT RIGHT HF 3; KNEE EXT 4; KNEE FLEX LIMITED ROM (3/5)  SENSORY:   normal and symmetric to light touch, temperature, vibration; EXCEPT DECR IN RIGHT LEG  COORDINATION:   finger-nose-finger, fine finger movements normal  REFLEXES:   deep tendon reflexes 1+ and symmetric; ABSENT IN RIGHT KNEE; TRACE AT ANKLES  GAIT/STATION:   UNSTEADY GAIT; RIGHT LEG STIFF; USES CANE     DIAGNOSTIC DATA (LABS, IMAGING, TESTING) - I reviewed patient records, labs, notes,  testing and imaging myself where available.  Lab Results  Component Value Date   WBC 6.0 01/30/2019   HGB 12.6 01/30/2019   HCT 37.6 01/30/2019   MCV 93.8 01/30/2019   PLT 208 01/30/2019      Component Value Date/Time   NA 147 (H) 03/02/2019 1404   NA 143 12/08/2015 1209   K 4.1 03/02/2019 1404   K 4.2 12/08/2015 1209   CL 107 (H) 03/02/2019 1404   CO2 24 03/02/2019 1404   CO2 25 12/08/2015 1209   GLUCOSE 110 (H) 03/02/2019 1404   GLUCOSE 85 01/30/2019 1416   GLUCOSE 89 12/08/2015 1209   BUN 13 03/02/2019 1404   BUN 25.3 12/08/2015 1209   CREATININE 0.97 03/02/2019 1404   CREATININE 0.94 (H) 01/30/2019 1416   CREATININE 1.1 12/08/2015 1209   CALCIUM 8.9 03/02/2019 1404   CALCIUM 9.2 12/08/2015 1209   PROT 6.2 01/30/2019 1416   PROT 7.0 12/08/2015 1209   ALBUMIN 3.5 04/26/2018 1150   ALBUMIN 3.1 (L) 12/08/2015 1209   AST 31 01/30/2019 1416   AST 16 12/08/2015 1209   ALT 55 (H) 01/30/2019 1416   ALT 10 12/08/2015 1209   ALKPHOS 99 04/26/2018 1150   ALKPHOS 104 12/08/2015 1209   BILITOT 0.4 01/30/2019 1416   BILITOT <0.30 12/08/2015 1209   GFRNONAA 54 (L) 03/02/2019 1404   GFRNONAA 56 (L) 01/30/2019 1416   GFRAA 62 03/02/2019 1404   GFRAA 65 01/30/2019 1416   Lab Results  Component Value Date   CHOL 132 01/30/2019   HDL 36 (L) 01/30/2019   LDLCALC 78 01/30/2019   TRIG 96 01/30/2019   CHOLHDL 3.7 01/30/2019   Lab Results  Component Value Date   HGBA1C 4.9 01/30/2019   Lab Results  Component Value Date   HERDEYCX44 818 01/30/2019   Lab Results  Component Value Date   TSH 1.04 01/30/2019    05/28/14 CT head [I reviewed images myself and agree with interpretation. -VRP]  - No acute intracranial pathology.   ASSESSMENT AND PLAN  85 y.o. year old female here with multiple right leg infections, surgeries, weakness, from childhood as well as in 2015, status post fall in February 2020 resulting in increased right leg numbness and pain.  Most likely  represents posttraumatic lumbar radiculopathy versus peripheral neuropathy.    Dx:  1. Right leg numbness   2. Chronic bilateral low back pain without sciatica      PLAN:  RIGHT LEG NUMBNESS / GAIT DIFF - We considered additional testing such as CT or MRI of the lower back, but we will hold off as patient is not interested in interventions or surgeries at this time.  Also patient has history of pacemaker which apparently is not MRI compatible, and therefore patient would require CT lumbar spine which has limited resolution.  Patient  is on anticoagulation and therefore CT myelogram would be challenging. - consider PT evaluation; use cane / walker - caution with gait and balance; needs assistance at home  HYPERTENSION - follow up with PCP re: HTN  Return for return to PCP, pending if symptoms worsen or fail to improve.    Penni Bombard, MD 12/08/3152, 0:08 PM Certified in Neurology, Neurophysiology and Neuroimaging  [facility] [address] [place], [place] [address] [phone]

## 2019-03-14 ENCOUNTER — Ambulatory Visit (INDEPENDENT_AMBULATORY_CARE_PROVIDER_SITE_OTHER): Payer: Medicare Other | Admitting: Internal Medicine

## 2019-03-14 DIAGNOSIS — I5033 Acute on chronic diastolic (congestive) heart failure: Secondary | ICD-10-CM | POA: Diagnosis not present

## 2019-03-14 DIAGNOSIS — K219 Gastro-esophageal reflux disease without esophagitis: Secondary | ICD-10-CM | POA: Diagnosis not present

## 2019-03-14 DIAGNOSIS — Z96651 Presence of right artificial knee joint: Secondary | ICD-10-CM | POA: Diagnosis not present

## 2019-03-14 DIAGNOSIS — Z905 Acquired absence of kidney: Secondary | ICD-10-CM | POA: Diagnosis not present

## 2019-03-14 DIAGNOSIS — Z5181 Encounter for therapeutic drug level monitoring: Secondary | ICD-10-CM | POA: Diagnosis not present

## 2019-03-14 DIAGNOSIS — I272 Pulmonary hypertension, unspecified: Secondary | ICD-10-CM | POA: Diagnosis not present

## 2019-03-14 DIAGNOSIS — G2581 Restless legs syndrome: Secondary | ICD-10-CM | POA: Diagnosis not present

## 2019-03-14 DIAGNOSIS — I2510 Atherosclerotic heart disease of native coronary artery without angina pectoris: Secondary | ICD-10-CM | POA: Diagnosis not present

## 2019-03-14 DIAGNOSIS — I130 Hypertensive heart and chronic kidney disease with heart failure and stage 1 through stage 4 chronic kidney disease, or unspecified chronic kidney disease: Secondary | ICD-10-CM | POA: Diagnosis not present

## 2019-03-14 DIAGNOSIS — K3184 Gastroparesis: Secondary | ICD-10-CM | POA: Diagnosis not present

## 2019-03-14 DIAGNOSIS — Z7901 Long term (current) use of anticoagulants: Secondary | ICD-10-CM | POA: Diagnosis not present

## 2019-03-14 DIAGNOSIS — I480 Paroxysmal atrial fibrillation: Secondary | ICD-10-CM

## 2019-03-14 DIAGNOSIS — E039 Hypothyroidism, unspecified: Secondary | ICD-10-CM | POA: Diagnosis not present

## 2019-03-14 DIAGNOSIS — I050 Rheumatic mitral stenosis: Secondary | ICD-10-CM | POA: Diagnosis not present

## 2019-03-14 DIAGNOSIS — Z8551 Personal history of malignant neoplasm of bladder: Secondary | ICD-10-CM | POA: Diagnosis not present

## 2019-03-14 DIAGNOSIS — G5791 Unspecified mononeuropathy of right lower limb: Secondary | ICD-10-CM | POA: Diagnosis not present

## 2019-03-14 DIAGNOSIS — H6523 Chronic serous otitis media, bilateral: Secondary | ICD-10-CM | POA: Diagnosis not present

## 2019-03-14 DIAGNOSIS — Z8553 Personal history of malignant neoplasm of renal pelvis: Secondary | ICD-10-CM | POA: Diagnosis not present

## 2019-03-14 DIAGNOSIS — Z8673 Personal history of transient ischemic attack (TIA), and cerebral infarction without residual deficits: Secondary | ICD-10-CM | POA: Diagnosis not present

## 2019-03-14 DIAGNOSIS — Z85028 Personal history of other malignant neoplasm of stomach: Secondary | ICD-10-CM | POA: Diagnosis not present

## 2019-03-14 DIAGNOSIS — Z950 Presence of cardiac pacemaker: Secondary | ICD-10-CM | POA: Diagnosis not present

## 2019-03-14 DIAGNOSIS — N189 Chronic kidney disease, unspecified: Secondary | ICD-10-CM | POA: Diagnosis not present

## 2019-03-14 LAB — POCT INR: INR: 2.6 (ref 2.0–3.0)

## 2019-03-14 NOTE — Patient Instructions (Signed)
Description   Spoke with Tabitha with [facility], advised pt to continue taking 1 tablet daily. Recheck INR in 4 weeks. Call us with any medication changes or concerns # [phone].

## 2019-03-15 ENCOUNTER — Other Ambulatory Visit: Payer: Self-pay | Admitting: Internal Medicine

## 2019-03-22 ENCOUNTER — Telehealth: Payer: Self-pay | Admitting: *Deleted

## 2019-03-22 DIAGNOSIS — I480 Paroxysmal atrial fibrillation: Secondary | ICD-10-CM | POA: Diagnosis not present

## 2019-03-22 DIAGNOSIS — Z85028 Personal history of other malignant neoplasm of stomach: Secondary | ICD-10-CM | POA: Diagnosis not present

## 2019-03-22 DIAGNOSIS — Z8553 Personal history of malignant neoplasm of renal pelvis: Secondary | ICD-10-CM | POA: Diagnosis not present

## 2019-03-22 DIAGNOSIS — I130 Hypertensive heart and chronic kidney disease with heart failure and stage 1 through stage 4 chronic kidney disease, or unspecified chronic kidney disease: Secondary | ICD-10-CM | POA: Diagnosis not present

## 2019-03-22 DIAGNOSIS — N189 Chronic kidney disease, unspecified: Secondary | ICD-10-CM | POA: Diagnosis not present

## 2019-03-22 DIAGNOSIS — Z7901 Long term (current) use of anticoagulants: Secondary | ICD-10-CM | POA: Diagnosis not present

## 2019-03-22 DIAGNOSIS — Z905 Acquired absence of kidney: Secondary | ICD-10-CM | POA: Diagnosis not present

## 2019-03-22 DIAGNOSIS — G5791 Unspecified mononeuropathy of right lower limb: Secondary | ICD-10-CM | POA: Diagnosis not present

## 2019-03-22 DIAGNOSIS — G2581 Restless legs syndrome: Secondary | ICD-10-CM | POA: Diagnosis not present

## 2019-03-22 DIAGNOSIS — Z5181 Encounter for therapeutic drug level monitoring: Secondary | ICD-10-CM | POA: Diagnosis not present

## 2019-03-22 DIAGNOSIS — Z950 Presence of cardiac pacemaker: Secondary | ICD-10-CM | POA: Diagnosis not present

## 2019-03-22 DIAGNOSIS — I5033 Acute on chronic diastolic (congestive) heart failure: Secondary | ICD-10-CM | POA: Diagnosis not present

## 2019-03-22 DIAGNOSIS — I272 Pulmonary hypertension, unspecified: Secondary | ICD-10-CM | POA: Diagnosis not present

## 2019-03-22 DIAGNOSIS — K219 Gastro-esophageal reflux disease without esophagitis: Secondary | ICD-10-CM | POA: Diagnosis not present

## 2019-03-22 DIAGNOSIS — I050 Rheumatic mitral stenosis: Secondary | ICD-10-CM | POA: Diagnosis not present

## 2019-03-22 DIAGNOSIS — Z8551 Personal history of malignant neoplasm of bladder: Secondary | ICD-10-CM | POA: Diagnosis not present

## 2019-03-22 DIAGNOSIS — K3184 Gastroparesis: Secondary | ICD-10-CM | POA: Diagnosis not present

## 2019-03-22 DIAGNOSIS — H6523 Chronic serous otitis media, bilateral: Secondary | ICD-10-CM | POA: Diagnosis not present

## 2019-03-22 DIAGNOSIS — Z96651 Presence of right artificial knee joint: Secondary | ICD-10-CM | POA: Diagnosis not present

## 2019-03-22 DIAGNOSIS — E039 Hypothyroidism, unspecified: Secondary | ICD-10-CM | POA: Diagnosis not present

## 2019-03-22 DIAGNOSIS — I2510 Atherosclerotic heart disease of native coronary artery without angina pectoris: Secondary | ICD-10-CM | POA: Diagnosis not present

## 2019-03-22 DIAGNOSIS — Z8673 Personal history of transient ischemic attack (TIA), and cerebral infarction without residual deficits: Secondary | ICD-10-CM | POA: Diagnosis not present

## 2019-03-22 NOTE — Telephone Encounter (Signed)
Daughter called and states patient's BP low this AM at 85/51.  Patient currently takes Metoprolol 10 mg, Verapamil 120 mg and Losartan 25 mg in the AM  Per Dr Melford Aase, reduce the Metoprolol to 50 mg twice a day, continue Verapamil the same and move the Losartan to the PM.  Daughter is aware.

## 2019-03-29 DIAGNOSIS — Z85028 Personal history of other malignant neoplasm of stomach: Secondary | ICD-10-CM | POA: Diagnosis not present

## 2019-03-29 DIAGNOSIS — I5033 Acute on chronic diastolic (congestive) heart failure: Secondary | ICD-10-CM | POA: Diagnosis not present

## 2019-03-29 DIAGNOSIS — Z96651 Presence of right artificial knee joint: Secondary | ICD-10-CM | POA: Diagnosis not present

## 2019-03-29 DIAGNOSIS — Z5181 Encounter for therapeutic drug level monitoring: Secondary | ICD-10-CM | POA: Diagnosis not present

## 2019-03-29 DIAGNOSIS — I272 Pulmonary hypertension, unspecified: Secondary | ICD-10-CM | POA: Diagnosis not present

## 2019-03-29 DIAGNOSIS — G5791 Unspecified mononeuropathy of right lower limb: Secondary | ICD-10-CM | POA: Diagnosis not present

## 2019-03-29 DIAGNOSIS — I480 Paroxysmal atrial fibrillation: Secondary | ICD-10-CM | POA: Diagnosis not present

## 2019-03-29 DIAGNOSIS — I050 Rheumatic mitral stenosis: Secondary | ICD-10-CM | POA: Diagnosis not present

## 2019-03-29 DIAGNOSIS — I130 Hypertensive heart and chronic kidney disease with heart failure and stage 1 through stage 4 chronic kidney disease, or unspecified chronic kidney disease: Secondary | ICD-10-CM | POA: Diagnosis not present

## 2019-03-29 DIAGNOSIS — G2581 Restless legs syndrome: Secondary | ICD-10-CM | POA: Diagnosis not present

## 2019-03-29 DIAGNOSIS — H6523 Chronic serous otitis media, bilateral: Secondary | ICD-10-CM | POA: Diagnosis not present

## 2019-03-29 DIAGNOSIS — Z905 Acquired absence of kidney: Secondary | ICD-10-CM | POA: Diagnosis not present

## 2019-03-29 DIAGNOSIS — E039 Hypothyroidism, unspecified: Secondary | ICD-10-CM | POA: Diagnosis not present

## 2019-03-29 DIAGNOSIS — N189 Chronic kidney disease, unspecified: Secondary | ICD-10-CM | POA: Diagnosis not present

## 2019-03-29 DIAGNOSIS — Z7901 Long term (current) use of anticoagulants: Secondary | ICD-10-CM | POA: Diagnosis not present

## 2019-03-29 DIAGNOSIS — K3184 Gastroparesis: Secondary | ICD-10-CM | POA: Diagnosis not present

## 2019-03-29 DIAGNOSIS — Z8553 Personal history of malignant neoplasm of renal pelvis: Secondary | ICD-10-CM | POA: Diagnosis not present

## 2019-03-29 DIAGNOSIS — Z8673 Personal history of transient ischemic attack (TIA), and cerebral infarction without residual deficits: Secondary | ICD-10-CM | POA: Diagnosis not present

## 2019-03-29 DIAGNOSIS — Z950 Presence of cardiac pacemaker: Secondary | ICD-10-CM | POA: Diagnosis not present

## 2019-03-29 DIAGNOSIS — Z8551 Personal history of malignant neoplasm of bladder: Secondary | ICD-10-CM | POA: Diagnosis not present

## 2019-03-29 DIAGNOSIS — I2510 Atherosclerotic heart disease of native coronary artery without angina pectoris: Secondary | ICD-10-CM | POA: Diagnosis not present

## 2019-03-29 DIAGNOSIS — K219 Gastro-esophageal reflux disease without esophagitis: Secondary | ICD-10-CM | POA: Diagnosis not present

## 2019-04-03 ENCOUNTER — Other Ambulatory Visit: Payer: Self-pay | Admitting: Cardiology

## 2019-04-05 DIAGNOSIS — E039 Hypothyroidism, unspecified: Secondary | ICD-10-CM | POA: Diagnosis not present

## 2019-04-05 DIAGNOSIS — Z950 Presence of cardiac pacemaker: Secondary | ICD-10-CM | POA: Diagnosis not present

## 2019-04-05 DIAGNOSIS — Z905 Acquired absence of kidney: Secondary | ICD-10-CM | POA: Diagnosis not present

## 2019-04-05 DIAGNOSIS — Z7901 Long term (current) use of anticoagulants: Secondary | ICD-10-CM | POA: Diagnosis not present

## 2019-04-05 DIAGNOSIS — Z8673 Personal history of transient ischemic attack (TIA), and cerebral infarction without residual deficits: Secondary | ICD-10-CM | POA: Diagnosis not present

## 2019-04-05 DIAGNOSIS — G2581 Restless legs syndrome: Secondary | ICD-10-CM | POA: Diagnosis not present

## 2019-04-05 DIAGNOSIS — N189 Chronic kidney disease, unspecified: Secondary | ICD-10-CM | POA: Diagnosis not present

## 2019-04-05 DIAGNOSIS — G5791 Unspecified mononeuropathy of right lower limb: Secondary | ICD-10-CM | POA: Diagnosis not present

## 2019-04-05 DIAGNOSIS — I050 Rheumatic mitral stenosis: Secondary | ICD-10-CM | POA: Diagnosis not present

## 2019-04-05 DIAGNOSIS — I2510 Atherosclerotic heart disease of native coronary artery without angina pectoris: Secondary | ICD-10-CM | POA: Diagnosis not present

## 2019-04-05 DIAGNOSIS — I480 Paroxysmal atrial fibrillation: Secondary | ICD-10-CM | POA: Diagnosis not present

## 2019-04-05 DIAGNOSIS — Z5181 Encounter for therapeutic drug level monitoring: Secondary | ICD-10-CM | POA: Diagnosis not present

## 2019-04-05 DIAGNOSIS — K3184 Gastroparesis: Secondary | ICD-10-CM | POA: Diagnosis not present

## 2019-04-05 DIAGNOSIS — Z96651 Presence of right artificial knee joint: Secondary | ICD-10-CM | POA: Diagnosis not present

## 2019-04-05 DIAGNOSIS — I5033 Acute on chronic diastolic (congestive) heart failure: Secondary | ICD-10-CM | POA: Diagnosis not present

## 2019-04-05 DIAGNOSIS — H6523 Chronic serous otitis media, bilateral: Secondary | ICD-10-CM | POA: Diagnosis not present

## 2019-04-05 DIAGNOSIS — Z8553 Personal history of malignant neoplasm of renal pelvis: Secondary | ICD-10-CM | POA: Diagnosis not present

## 2019-04-05 DIAGNOSIS — I272 Pulmonary hypertension, unspecified: Secondary | ICD-10-CM | POA: Diagnosis not present

## 2019-04-05 DIAGNOSIS — Z8551 Personal history of malignant neoplasm of bladder: Secondary | ICD-10-CM | POA: Diagnosis not present

## 2019-04-05 DIAGNOSIS — K219 Gastro-esophageal reflux disease without esophagitis: Secondary | ICD-10-CM | POA: Diagnosis not present

## 2019-04-05 DIAGNOSIS — I130 Hypertensive heart and chronic kidney disease with heart failure and stage 1 through stage 4 chronic kidney disease, or unspecified chronic kidney disease: Secondary | ICD-10-CM | POA: Diagnosis not present

## 2019-04-05 DIAGNOSIS — Z85028 Personal history of other malignant neoplasm of stomach: Secondary | ICD-10-CM | POA: Diagnosis not present

## 2019-04-11 ENCOUNTER — Ambulatory Visit (INDEPENDENT_AMBULATORY_CARE_PROVIDER_SITE_OTHER): Payer: Medicare Other | Admitting: Internal Medicine

## 2019-04-11 DIAGNOSIS — Z7901 Long term (current) use of anticoagulants: Secondary | ICD-10-CM

## 2019-04-11 DIAGNOSIS — Z96651 Presence of right artificial knee joint: Secondary | ICD-10-CM | POA: Diagnosis not present

## 2019-04-11 DIAGNOSIS — I050 Rheumatic mitral stenosis: Secondary | ICD-10-CM | POA: Diagnosis not present

## 2019-04-11 DIAGNOSIS — I2510 Atherosclerotic heart disease of native coronary artery without angina pectoris: Secondary | ICD-10-CM | POA: Diagnosis not present

## 2019-04-11 DIAGNOSIS — I5033 Acute on chronic diastolic (congestive) heart failure: Secondary | ICD-10-CM | POA: Diagnosis not present

## 2019-04-11 DIAGNOSIS — E039 Hypothyroidism, unspecified: Secondary | ICD-10-CM | POA: Diagnosis not present

## 2019-04-11 DIAGNOSIS — I480 Paroxysmal atrial fibrillation: Secondary | ICD-10-CM | POA: Diagnosis not present

## 2019-04-11 DIAGNOSIS — I272 Pulmonary hypertension, unspecified: Secondary | ICD-10-CM | POA: Diagnosis not present

## 2019-04-11 DIAGNOSIS — G2581 Restless legs syndrome: Secondary | ICD-10-CM | POA: Diagnosis not present

## 2019-04-11 DIAGNOSIS — Z905 Acquired absence of kidney: Secondary | ICD-10-CM | POA: Diagnosis not present

## 2019-04-11 DIAGNOSIS — Z5181 Encounter for therapeutic drug level monitoring: Secondary | ICD-10-CM | POA: Diagnosis not present

## 2019-04-11 DIAGNOSIS — Z8551 Personal history of malignant neoplasm of bladder: Secondary | ICD-10-CM | POA: Diagnosis not present

## 2019-04-11 DIAGNOSIS — Z8673 Personal history of transient ischemic attack (TIA), and cerebral infarction without residual deficits: Secondary | ICD-10-CM | POA: Diagnosis not present

## 2019-04-11 DIAGNOSIS — Z950 Presence of cardiac pacemaker: Secondary | ICD-10-CM | POA: Diagnosis not present

## 2019-04-11 DIAGNOSIS — K219 Gastro-esophageal reflux disease without esophagitis: Secondary | ICD-10-CM | POA: Diagnosis not present

## 2019-04-11 DIAGNOSIS — K3184 Gastroparesis: Secondary | ICD-10-CM | POA: Diagnosis not present

## 2019-04-11 DIAGNOSIS — Z8553 Personal history of malignant neoplasm of renal pelvis: Secondary | ICD-10-CM | POA: Diagnosis not present

## 2019-04-11 DIAGNOSIS — I130 Hypertensive heart and chronic kidney disease with heart failure and stage 1 through stage 4 chronic kidney disease, or unspecified chronic kidney disease: Secondary | ICD-10-CM | POA: Diagnosis not present

## 2019-04-11 DIAGNOSIS — Z85028 Personal history of other malignant neoplasm of stomach: Secondary | ICD-10-CM | POA: Diagnosis not present

## 2019-04-11 DIAGNOSIS — H6523 Chronic serous otitis media, bilateral: Secondary | ICD-10-CM | POA: Diagnosis not present

## 2019-04-11 DIAGNOSIS — G5791 Unspecified mononeuropathy of right lower limb: Secondary | ICD-10-CM | POA: Diagnosis not present

## 2019-04-11 DIAGNOSIS — N189 Chronic kidney disease, unspecified: Secondary | ICD-10-CM | POA: Diagnosis not present

## 2019-04-11 LAB — POCT INR: INR: 2.2 (ref 2.0–3.0)

## 2019-04-11 NOTE — Patient Instructions (Signed)
Description   Spoke with Daleen Snook RN with [facility], advised pt to continue taking 1 tablet daily. Recheck INR in 4 weeks. Call us with any medication changes or concerns # [phone].

## 2019-04-19 DIAGNOSIS — Z5181 Encounter for therapeutic drug level monitoring: Secondary | ICD-10-CM | POA: Diagnosis not present

## 2019-04-19 DIAGNOSIS — K3184 Gastroparesis: Secondary | ICD-10-CM | POA: Diagnosis not present

## 2019-04-19 DIAGNOSIS — I5033 Acute on chronic diastolic (congestive) heart failure: Secondary | ICD-10-CM | POA: Diagnosis not present

## 2019-04-19 DIAGNOSIS — K219 Gastro-esophageal reflux disease without esophagitis: Secondary | ICD-10-CM | POA: Diagnosis not present

## 2019-04-19 DIAGNOSIS — I2510 Atherosclerotic heart disease of native coronary artery without angina pectoris: Secondary | ICD-10-CM | POA: Diagnosis not present

## 2019-04-19 DIAGNOSIS — G5791 Unspecified mononeuropathy of right lower limb: Secondary | ICD-10-CM | POA: Diagnosis not present

## 2019-04-19 DIAGNOSIS — Z8553 Personal history of malignant neoplasm of renal pelvis: Secondary | ICD-10-CM | POA: Diagnosis not present

## 2019-04-19 DIAGNOSIS — Z905 Acquired absence of kidney: Secondary | ICD-10-CM | POA: Diagnosis not present

## 2019-04-19 DIAGNOSIS — I480 Paroxysmal atrial fibrillation: Secondary | ICD-10-CM | POA: Diagnosis not present

## 2019-04-19 DIAGNOSIS — Z8551 Personal history of malignant neoplasm of bladder: Secondary | ICD-10-CM | POA: Diagnosis not present

## 2019-04-19 DIAGNOSIS — Z7901 Long term (current) use of anticoagulants: Secondary | ICD-10-CM | POA: Diagnosis not present

## 2019-04-19 DIAGNOSIS — Z8673 Personal history of transient ischemic attack (TIA), and cerebral infarction without residual deficits: Secondary | ICD-10-CM | POA: Diagnosis not present

## 2019-04-19 DIAGNOSIS — Z96651 Presence of right artificial knee joint: Secondary | ICD-10-CM | POA: Diagnosis not present

## 2019-04-19 DIAGNOSIS — Z950 Presence of cardiac pacemaker: Secondary | ICD-10-CM | POA: Diagnosis not present

## 2019-04-19 DIAGNOSIS — G2581 Restless legs syndrome: Secondary | ICD-10-CM | POA: Diagnosis not present

## 2019-04-19 DIAGNOSIS — E039 Hypothyroidism, unspecified: Secondary | ICD-10-CM | POA: Diagnosis not present

## 2019-04-19 DIAGNOSIS — N189 Chronic kidney disease, unspecified: Secondary | ICD-10-CM | POA: Diagnosis not present

## 2019-04-19 DIAGNOSIS — I272 Pulmonary hypertension, unspecified: Secondary | ICD-10-CM | POA: Diagnosis not present

## 2019-04-19 DIAGNOSIS — I050 Rheumatic mitral stenosis: Secondary | ICD-10-CM | POA: Diagnosis not present

## 2019-04-19 DIAGNOSIS — I130 Hypertensive heart and chronic kidney disease with heart failure and stage 1 through stage 4 chronic kidney disease, or unspecified chronic kidney disease: Secondary | ICD-10-CM | POA: Diagnosis not present

## 2019-04-19 DIAGNOSIS — Z85028 Personal history of other malignant neoplasm of stomach: Secondary | ICD-10-CM | POA: Diagnosis not present

## 2019-04-19 DIAGNOSIS — H6523 Chronic serous otitis media, bilateral: Secondary | ICD-10-CM | POA: Diagnosis not present

## 2019-04-22 ENCOUNTER — Other Ambulatory Visit: Payer: Self-pay | Admitting: Physician Assistant

## 2019-05-02 DIAGNOSIS — Z950 Presence of cardiac pacemaker: Secondary | ICD-10-CM | POA: Diagnosis not present

## 2019-05-02 DIAGNOSIS — G5791 Unspecified mononeuropathy of right lower limb: Secondary | ICD-10-CM | POA: Diagnosis not present

## 2019-05-02 DIAGNOSIS — K219 Gastro-esophageal reflux disease without esophagitis: Secondary | ICD-10-CM | POA: Diagnosis not present

## 2019-05-02 DIAGNOSIS — I480 Paroxysmal atrial fibrillation: Secondary | ICD-10-CM | POA: Diagnosis not present

## 2019-05-02 DIAGNOSIS — I272 Pulmonary hypertension, unspecified: Secondary | ICD-10-CM | POA: Diagnosis not present

## 2019-05-02 DIAGNOSIS — Z8673 Personal history of transient ischemic attack (TIA), and cerebral infarction without residual deficits: Secondary | ICD-10-CM | POA: Diagnosis not present

## 2019-05-02 DIAGNOSIS — I2510 Atherosclerotic heart disease of native coronary artery without angina pectoris: Secondary | ICD-10-CM | POA: Diagnosis not present

## 2019-05-02 DIAGNOSIS — E039 Hypothyroidism, unspecified: Secondary | ICD-10-CM | POA: Diagnosis not present

## 2019-05-02 DIAGNOSIS — H6523 Chronic serous otitis media, bilateral: Secondary | ICD-10-CM | POA: Diagnosis not present

## 2019-05-02 DIAGNOSIS — Z96651 Presence of right artificial knee joint: Secondary | ICD-10-CM | POA: Diagnosis not present

## 2019-05-02 DIAGNOSIS — Z7901 Long term (current) use of anticoagulants: Secondary | ICD-10-CM | POA: Diagnosis not present

## 2019-05-02 DIAGNOSIS — K3184 Gastroparesis: Secondary | ICD-10-CM | POA: Diagnosis not present

## 2019-05-02 DIAGNOSIS — G2581 Restless legs syndrome: Secondary | ICD-10-CM | POA: Diagnosis not present

## 2019-05-02 DIAGNOSIS — I130 Hypertensive heart and chronic kidney disease with heart failure and stage 1 through stage 4 chronic kidney disease, or unspecified chronic kidney disease: Secondary | ICD-10-CM | POA: Diagnosis not present

## 2019-05-02 DIAGNOSIS — Z8553 Personal history of malignant neoplasm of renal pelvis: Secondary | ICD-10-CM | POA: Diagnosis not present

## 2019-05-02 DIAGNOSIS — Z905 Acquired absence of kidney: Secondary | ICD-10-CM | POA: Diagnosis not present

## 2019-05-02 DIAGNOSIS — Z85028 Personal history of other malignant neoplasm of stomach: Secondary | ICD-10-CM | POA: Diagnosis not present

## 2019-05-02 DIAGNOSIS — I050 Rheumatic mitral stenosis: Secondary | ICD-10-CM | POA: Diagnosis not present

## 2019-05-02 DIAGNOSIS — Z5181 Encounter for therapeutic drug level monitoring: Secondary | ICD-10-CM | POA: Diagnosis not present

## 2019-05-02 DIAGNOSIS — I5033 Acute on chronic diastolic (congestive) heart failure: Secondary | ICD-10-CM | POA: Diagnosis not present

## 2019-05-02 DIAGNOSIS — N189 Chronic kidney disease, unspecified: Secondary | ICD-10-CM | POA: Diagnosis not present

## 2019-05-02 DIAGNOSIS — Z8551 Personal history of malignant neoplasm of bladder: Secondary | ICD-10-CM | POA: Diagnosis not present

## 2019-05-07 NOTE — Progress Notes (Signed)
3 month follow up  Assessment and Plan:    Essential hypertension - continue medications, DASH diet, exercise and monitor at home. Call if greater than 130/80.  -     CBC with Differential/Platelet -     BASIC METABOLIC PANEL WITH GFR -     Hepatic function panel -     TSH  Persistent atrial fibrillation (HCC) Continue cardio follow up  Adenocarcinoma of stomach (HCC) Continue follow up Dr. Laurene Footman Having weight loss, getting EGD  Malignant neoplasm of kidney excluding renal pelvis, unspecified laterality (HCC) Check BMP, continue to monitor  CKD (chronic kidney disease), stage III Increase fluids, avoid NSAIDS, monitor sugars, will monitor -     BASIC METABOLIC PANEL WITH GFR  Hyperlipidemia, unspecified hyperlipidemia type -continue medications, check lipids, decrease fatty foods, increase activity.  -     Lipid panel  Medication management -     Magnesium  Protein-calorie malnutrition, moderate (HCC) Discussed diet/drinking ensure, continue to monitor weight daily -     Hepatic function panel  Tachycardia-bradycardia syndrome (HCC) Continue cardio follow up  Vitamin B12 deficiency -     Vitamin B12  Vitamin D deficiency -     VITAMIN D 25 Hydroxy (Vit-D Deficiency, Fractures)  Medication management -     Magnesium  Iron deficiency -     Iron,Total/Total Iron Binding Cap -     Ferritin    Over 30 minutes of exam, counseling, chart review and critical decision making was performed Future Appointments  Date Time Provider [place]  05/31/2019  7:30 AM [facility] DEVICE REMOTES [facility]  08/08/2019 10:30 AM [name], MD [facility] None  08/30/2019  7:30 AM [facility] DEVICE REMOTES [facility]  11/07/2019 11:15 AM Vicie Mutters, PA-C [facility] None  11/29/2019  7:30 AM [facility] DEVICE REMOTES [facility]  02/12/2020  2:00 PM [name], MD [facility] None  02/28/2020  7:30 AM  [facility] DEVICE REMOTES [facility]  05/29/2020  7:30 AM [facility] DEVICE REMOTES [facility]     Subjective:  [name] is a 85 y.o. female who presents for 3 month follow up for HTN, chol, Afib, gastric adenocarcinoma.   She had partial gastrectomy in May 2017 for gastric adenocarcinoma with complication of abdominal abscess, following with [name] and Dr. Carlean Purl.  She has a history of renal and uterine cancer.  She was on shots in the past for B12 def, on sublingual, will check today.  Lab Results  Component Value Date   VITAMINB12 238 01/30/2019   Last iron was low, she is on an iron supplement.  Lab Results  Component Value Date   IRON 34 (L) 01/30/2019   TIBC 419 01/30/2019   FERRITIN 81 08/11/2016   BMI is Body mass index is 19.59 kg/m., she is working on weight gain but also being monitored for diastolic CHF. .  Wt Readings from Last 3 Encounters:  05/09/19 97 lb (44 kg)  03/13/19 97 lb 3.2 oz (44.1 kg)  03/02/19 95 lb 9.6 oz (43.4 kg)   Her blood pressure has been controlled at home, today their BP is BP: (!) 142/80  She does not workout. She denies chest pain, shortness of breath, dizziness. She has Afib/PPM and AS s/p pacemaker, follows with Dr. Angelena Form, on coumadin, taking 2 mg daily.  Lab Results  Component Value Date   INR 2.2 04/11/2019   INR 2.6 03/14/2019   INR 3.3 (A) 03/02/2019    She is not on cholesterol medication  and denies myalgias. Her cholesterol is not at goal. The cholesterol last visit was:   Lab Results  Component Value Date   CHOL 132 01/30/2019   HDL 36 (L) 01/30/2019   LDLCALC 78 01/30/2019   TRIG 96 01/30/2019   CHOLHDL 3.7 01/30/2019   Last A1C in the office was:  Lab Results  Component Value Date   HGBA1C 4.9 01/30/2019   Lab Results  Component Value Date   GFRNONAA 54 (L) 03/02/2019   Patient is on Vitamin D supplement.   Lab Results  Component Value Date   VD25OH 48 01/30/2019      She is on thyroid medication. Her medication was not changed last visit.   Lab Results  Component Value Date   TSH 1.04 01/30/2019  .  Medication Review: Current Outpatient Medications on File Prior to Visit  Medication Sig  . acetaminophen (TYLENOL) 500 MG tablet Take 500-1,000 mg by mouth every 6 (six) hours as needed for mild pain or headache.   . Biotin 10 MG CAPS Take 10 mg by mouth daily.   . cetirizine (ZYRTEC) 10 MG tablet Take 10 mg by mouth daily.  . Cholecalciferol (VITAMIN D-3) 5000 units TABS Take 5,000 Units by mouth 3 (three) times daily.   . famotidine (PEPCID) 40 MG tablet Take 1 tablet (40 mg total) by mouth daily.  [name] gabapentin (NEURONTIN) 300 MG capsule Take 1 capsule by mouth 2 (two) times a day.  . IRON, FERROUS SULFATE, PO Take 1 tablet by mouth daily.  [name] levothyroxine (SYNTHROID) 100 MCG tablet TAKE 1/2 TABLET BY MOUTH DAILY BEFORE BREAKFAST.  [name] loperamide (IMODIUM A-D) 2 MG tablet Take up to 12 tablets /day as needed for  for Diarrhea  . losartan (COZAAR) 25 MG tablet TAKE 2 TABLETS BY MOUTH EVERY DAY  . metoprolol tartrate (LOPRESSOR) 100 MG tablet Take 1 tablet 2 x /day for BP  . nitroGLYCERIN (NITROSTAT) 0.4 MG SL tablet Place 1 tablet (0.4 mg total) under the tongue every 5 (five) minutes as needed for chest pain (MAX 3 TABLETS).  . ondansetron (ZOFRAN-ODT) 8 MG disintegrating tablet Take 8 mg by mouth every 8 (eight) hours as needed for nausea or vomiting.   . potassium chloride (K-DUR) 10 MEQ tablet Take 10 mEq by mouth daily.  . simethicone (MYLICON) 80 MG chewable tablet Chew 80 mg by mouth every 6 (six) hours as needed for flatulence.  . sucralfate (CARAFATE) 1 g tablet TAKE 1 TABLET (1 G TOTAL) BY MOUTH 4 (FOUR) TIMES DAILY - WITH MEALS AND AT BEDTIME. (Patient taking differently: Take 1 g by mouth 3 (three) times daily. )  . verapamil (CALAN-SR) 120 MG CR tablet Take 1 tablet 2 x /day with a meal for BP & Heart Rhythm  . warfarin (COUMADIN) 2 MG tablet  Take 1 to 2 tablets as directed to Prevent Blood Clots   No current facility-administered medications on file prior to visit.      Allergies  Allergen Reactions  . Adhesive [Tape] Other (See Comments)    Tears skin, Please use "paper" tape  . Amiodarone Other (See Comments)     Thyroid and liver and kidney problems  . Hydrocodone Other (See Comments)    HALLLUCINATIONS  . Remeron [Mirtazapine] Other (See Comments)    Hallucinations and make pt loopy  . Codeine Nausea And Vomiting  . Morphine And Related Nausea And Vomiting  . Reglan [Metoclopramide] Hives, Itching and Rash  . Requip [Ropinirole Hcl] Other (See  Comments)    Headache   . Zinc Nausea Only  . Other     NO BLOOD PRODUCTS   Immunization History  Administered Date(s) Administered  . Influenza, High Dose Seasonal PF 07/18/2015, 06/23/2017, 06/27/2018  . Influenza,inj,Quad PF,6+ Mos 05/31/2014, 06/09/2016  . Influenza-Unspecified 09/16/2009, 07/03/2013  . Pneumococcal Conjugate-13 09/06/2002  . Pneumococcal Polysaccharide-23 07/18/2015  . Td 09/06/2002  . Zoster 09/06/2008    Current Problems (verified) Patient Active Problem List   Diagnosis Date Noted  . Abnormal glucose 01/29/2019  . Acute on chronic diastolic heart failure ([place]) 01/05/2019  . Malignant tumor of urinary bladder ([place]) 05/11/2018  . History of stroke 04/11/2018  . Long term current use of anticoagulant therapy 03/19/2016  . No blood products 02/27/2016  . S/P partial gastrectomy 02/17/2016  . Post gastrectomy syndrome   . Gastroesophageal reflux disease with esophagitis   . Iron deficiency anemia due to chronic blood loss 11/24/2015  . Refusal of blood transfusions as patient is Jehovah's Witness   . Adenocarcinoma of stomach ([place]) 11/11/2015  . CKD (chronic kidney disease), stage III ([place]) 11/10/2015  . Vitamin B12 deficiency 11/09/2015  . Essential hypertension 06/04/2014  . Vitamin D deficiency 09/24/2013  . Hyperlipidemia, mixed    . Cardiac pacemaker  08/26/2012  . FHx/o Colon Cancer 07/27/2011  . Aortic stenosis, moderate 07/09/2011  . S/P repair of patent ductus arteriosus 04/22/2011  . Tachycardia-bradycardia syndrome ([place]) 12/24/2010  . Atrial fibrillation ([place]) Chadsvasc 5 12/02/2010  . Malignant neoplasm of kidney excluding renal pelvis ([place]) 11/17/2007  . Hypothyroidism 11/17/2007   SURGICAL HISTORY She  has a past surgical history that includes Cardiac catheterization (2008); Nephrectomy (Left); Patent ductus arterious repair (~ 1949); Cholecystectomy (1970s); Total knee arthroplasty (02/07/2012); Total hip revision (Right, 2009); I&D knee with poly exchange (Right, 12/17/2013); Patellar tendon repair (Right, 03/06/2014); Total knee revision (Right, 07/29/2014); Colonoscopy (N/A, 11/11/2015); Esophagogastroduodenoscopy (N/A, 11/11/2015); Total abdominal hysterectomy (87); laparoscopy (N/A, 01/15/2016); Gastrectomy (N/A, 01/15/2016); Pacemaker insertion (07/04/2011); Esophagogastroduodenoscopy (N/A, 02/05/2016); Gastrectomy (Left, 02/24/2016); Esophagogastroduodenoscopy (N/A, 04/02/2016); Whipple procedure (N/A, 05/18/2016); Gastrojejunostomy (N/A, 05/18/2016); Lysis of adhesion (05/18/2016); ir generic historical (06/17/2016); Esophagogastroduodenoscopy (egd) with propofol (N/A, 04/06/2017); Transurethral resection of bladder tumor with mitomycin-c (N/A, 05/01/2018); Cystoscopy w/ retrogrades (Right, 05/01/2018); Transurethral resection of bladder tumor (N/A, 07/21/2018); and Cystoscopy w/ retrogrades (Right, 07/21/2018). FAMILY HISTORY Her family history includes Colon cancer (age of onset: 39) in her mother; Heart attack in her father; Heart disease in her father, maternal grandmother, and mother. SOCIAL HISTORY She  reports that she has never smoked. She has never used smokeless tobacco. She reports that she does not drink alcohol or use drugs.   Review of Systems  Constitutional: Negative.   HENT: Negative.   Eyes: Negative.    Respiratory: Negative.   Cardiovascular: Negative.   Gastrointestinal: Negative.   Genitourinary: Negative.   Musculoskeletal: Negative.   Skin: Negative.   Neurological: Negative.   Endo/Heme/Allergies: Negative.   Psychiatric/Behavioral: Negative.      Objective:     Today's Vitals   05/09/19 1043  BP: (!) 142/80  Pulse: 82  Temp: (!) 97.5 F (36.4 C)  SpO2: 98%  Weight: 97 lb (44 kg)   Body mass index is 19.59 kg/m.  General appearance: alert, no distress, WD/WN, female HEENT: normocephalic, sclerae anicteric, TMs pearly, nares patent, no discharge or erythema, pharynx normal Oral cavity: MMM, no lesions Neck: supple, no lymphadenopathy, no thyromegaly, no masses Heart: RRR, normal S1, S2, no murmurs Lungs: CTA bilaterally, no wheezes,  rhonchi, or rales Abdomen: + BS, soft, non tender, no rebound tenderness non distended, no masses, no hepatomegaly, no splenomegaly Musculoskeletal: nontender, no swelling, no obvious deformity, nontender back, no CVA tenderness Extremities: no edema, no cyanosis, no clubbing Pulses: 2+ symmetric, upper and lower extremities, normal cap refill Neurological: alert, oriented x 3, CN2-12 intact, strength normal upper extremities and lower extremities, sensation normal throughout, DTRs 2+ throughout, no cerebellar signs, gait antalgic with cane Psychiatric: normal affect, behavior normal, pleasant    Vicie Mutters, PA-C   05/09/2019

## 2019-05-09 ENCOUNTER — Encounter: Payer: Self-pay | Admitting: Physician Assistant

## 2019-05-09 ENCOUNTER — Ambulatory Visit (INDEPENDENT_AMBULATORY_CARE_PROVIDER_SITE_OTHER): Payer: Medicare Other | Admitting: Physician Assistant

## 2019-05-09 ENCOUNTER — Other Ambulatory Visit: Payer: Self-pay

## 2019-05-09 VITALS — BP 142/80 | HR 82 | Temp 97.5°F | Wt 97.0 lb

## 2019-05-09 DIAGNOSIS — C679 Malignant neoplasm of bladder, unspecified: Secondary | ICD-10-CM

## 2019-05-09 DIAGNOSIS — I495 Sick sinus syndrome: Secondary | ICD-10-CM | POA: Diagnosis not present

## 2019-05-09 DIAGNOSIS — E538 Deficiency of other specified B group vitamins: Secondary | ICD-10-CM

## 2019-05-09 DIAGNOSIS — Z79899 Other long term (current) drug therapy: Secondary | ICD-10-CM | POA: Diagnosis not present

## 2019-05-09 DIAGNOSIS — E559 Vitamin D deficiency, unspecified: Secondary | ICD-10-CM | POA: Diagnosis not present

## 2019-05-09 DIAGNOSIS — I5033 Acute on chronic diastolic (congestive) heart failure: Secondary | ICD-10-CM

## 2019-05-09 DIAGNOSIS — I480 Paroxysmal atrial fibrillation: Secondary | ICD-10-CM

## 2019-05-09 DIAGNOSIS — I10 Essential (primary) hypertension: Secondary | ICD-10-CM

## 2019-05-09 DIAGNOSIS — E611 Iron deficiency: Secondary | ICD-10-CM | POA: Diagnosis not present

## 2019-05-09 DIAGNOSIS — N183 Chronic kidney disease, stage 3 unspecified: Secondary | ICD-10-CM

## 2019-05-09 DIAGNOSIS — C169 Malignant neoplasm of stomach, unspecified: Secondary | ICD-10-CM

## 2019-05-09 DIAGNOSIS — E782 Mixed hyperlipidemia: Secondary | ICD-10-CM

## 2019-05-09 NOTE — Patient Instructions (Addendum)
WEIGHT GAIN  Try to make sure you are eating plenty of high calorie dense foods like: Avocado Nuts Peanut butter Oatmeal Dates Opa-locka yogurt Protein powder   Ask insurance and pharmacy about shingrix - new vaccine   Can go to [email or web address] for more information  Shingrix Vaccination  Two vaccines are licensed and recommended to prevent shingles in the [place] Zoster vaccine live (ZVL, Zostavax) has been in use since 2006. Recombinant zoster vaccine (RZV, Shingrix), has been in use since 2017 and is recommended by ACIP as the preferred shingles vaccine.  What Everyone Should Know about Shingles Vaccine (Shingrix) One of the Recommended Vaccines by Disease Shingles vaccination is the only way to protect against shingles and postherpetic neuralgia (PHN), the most common complication from shingles. CDC recommends that healthy adults 50 years and older get two doses of the shingles vaccine called Shingrix (recombinant zoster vaccine), separated by 2 to 6 months, to prevent shingles and the complications from the disease. Your doctor or pharmacist can give you Shingrix as a shot in your upper arm. Shingrix provides strong protection against shingles and PHN. Two doses of Shingrix is more than 90% effective at preventing shingles and PHN. Protection stays above 85% for at least the first four years after you get vaccinated. Shingrix is the preferred vaccine, over Zostavax (zoster vaccine live), a shingles vaccine in use since 2006. Zostavax may still be used to prevent shingles in healthy adults 60 years and older. For example, you could use Zostavax if a person is allergic to Shingrix, prefers Zostavax, or requests immediate vaccination and Shingrix is unavailable. Who Should Get Shingrix? Healthy adults 50 years and older should get two doses of Shingrix, separated by 2 to 6 months. You should get Shingrix even if in the past  you . had shingles  . received Zostavax  . are not sure if you had chickenpox There is no maximum age for getting Shingrix. If you had shingles in the past, you can get Shingrix to help prevent future occurrences of the disease. There is no specific length of time that you need to wait after having shingles before you can receive Shingrix, but generally you should make sure the shingles rash has gone away before getting vaccinated. You can get Shingrix whether or not you remember having had chickenpox in the past. Studies show that more than 99% of Americans 40 years and older have had chickenpox, even if they don't remember having the disease. Chickenpox and shingles are related because they are caused by the same virus (varicella zoster virus). After a person recovers from chickenpox, the virus stays dormant (inactive) in the body. It can reactivate years later and cause shingles. If you had Zostavax in the recent past, you should wait at least eight weeks before getting Shingrix. Talk to your healthcare provider to determine the best time to get Shingrix. Shingrix is available in [facility] and pharmacies. To find doctor's offices or pharmacies near you that offer the vaccine, visit HealthMap Vaccine FinderExternal. If you have questions about Shingrix, talk with your healthcare provider. Vaccine for Those 12 Years and Older  Shingrix reduces the risk of shingles and PHN by more than 90% in people 35 and older. CDC recommends the vaccine for healthy adults 85 and older.  Who Should Not Get Shingrix? You should not get Shingrix if you: . have ever had a severe allergic reaction to any component of the vaccine or after a dose of Shingrix  . tested  negative for immunity to varicella zoster virus. If you test negative, you should get chickenpox vaccine.  . currently have shingles  . currently are pregnant or breastfeeding. Women who are pregnant or breastfeeding should wait to get Shingrix.   [name] receive specific antiviral drugs (acyclovir, famciclovir, or valacyclovir) 24 hours before vaccination (avoid use of these antiviral drugs for 14 days after vaccination)- zoster vaccine live only If you have a minor acute (starts suddenly) illness, such as a cold, you may get Shingrix. But if you have a moderate or severe acute illness, you should usually wait until you recover before getting the vaccine. This includes anyone with a temperature of 101.38F or higher. The side effects of the Shingrix are temporary, and usually last 2 to 3 days. While you may experience pain for a few days after getting Shingrix, the pain will be less severe than having shingles and the complications from the disease. How Well Does Shingrix Work? Two doses of Shingrix provides strong protection against shingles and postherpetic neuralgia (PHN), the most common complication of shingles. . In adults 24 to 83 years old who got two doses, Shingrix was 97% effective in preventing shingles; among adults 70 years and older, Shingrix was 91% effective.  . In adults 28 to 83 years old who got two doses, Shingrix was 91% effective in preventing PHN; among adults 70 years and older, Shingrix was 89% effective. Shingrix protection remained high (more than 85%) in people 70 years and older throughout the four years following vaccination. Since your risk of shingles and PHN increases as you get older, it is important to have strong protection against shingles in your older years. Top of Page  What Are the Possible Side Effects of Shingrix? Studies show that Shingrix is safe. The vaccine helps your body create a strong defense against shingles. As a result, you are likely to have temporary side effects from getting the shots. The side effects may affect your ability to do normal daily activities for 2 to 3 days. Most people got a sore arm with mild or moderate pain after getting Shingrix, and some also had redness and swelling where  they got the shot. Some people felt tired, had muscle pain, a headache, shivering, fever, stomach pain, or nausea. About 1 out of 6 people who got Shingrix experienced side effects that prevented them from doing regular activities. Symptoms went away on their own in about 2 to 3 days. Side effects were more common in younger people. You might have a reaction to the first or second dose of Shingrix, or both doses. If you experience side effects, you may choose to take over-the-counter pain medicine such as ibuprofen or acetaminophen. If you experience side effects from Shingrix, you should report them to the Vaccine Adverse Event Reporting System (VAERS). Your doctor might file this report, or you can do it yourself through the VAERS websiteExternal, or by calling [phone]. If you have any questions about side effects from Shingrix, talk with your doctor. The shingles vaccine does not contain thimerosal (a preservative containing mercury). Top of Page  When Should I See a Doctor Because of the Side Effects I Experience From Shingrix? In clinical trials, Shingrix was not associated with serious adverse events. In fact, serious side effects from vaccines are extremely rare. For example, for every 1 million doses of a vaccine given, only one or two people may have a severe allergic reaction. Signs of an allergic reaction happen within minutes or hours after vaccination  and include hives, swelling of the face and throat, difficulty breathing, a fast heartbeat, dizziness, or weakness. If you experience these or any other life-threatening symptoms, see a doctor right away. Shingrix causes a strong response in your immune system, so it may produce short-term side effects more intense than you are used to from other vaccines. These side effects can be uncomfortable, but they are expected and usually go away on their own in 2 or 3 days. Top of Page  How Can I Pay For Shingrix? There are several ways shingles  vaccine may be paid for: Medicare . Medicare Part D plans cover the shingles vaccine, but there may be a cost to you depending on your plan. There may be a copay for the vaccine, or you may need to pay in full then get reimbursed for a certain amount.  . Medicare Part B does not cover the shingles vaccine. Medicaid . Medicaid may or may not cover the vaccine. Contact your insurer to find out. Private health insurance . Many private health insurance plans will cover the vaccine, but there may be a cost to you depending on your plan. Contact your insurer to find out. Vaccine assistance programs . Some pharmaceutical companies provide vaccines to eligible adults who cannot afford them. You may want to check with the vaccine manufacturer, [employer], about Shingrix. If you do not currently have health insurance, learn more about affordable health coverage optionsExternal. To find doctor's offices or pharmacies near you that offer the vaccine, visit HealthMap Vaccine FinderExternal.

## 2019-05-10 LAB — COMPLETE METABOLIC PANEL WITH GFR
AG Ratio: 1.8 (calc) (ref 1.0–2.5)
ALT: 20 U/L (ref 6–29)
AST: 23 U/L (ref 10–35)
Albumin: 3.7 g/dL (ref 3.6–5.1)
Alkaline phosphatase (APISO): 67 U/L (ref 37–153)
BUN: 14 mg/dL (ref 7–25)
CO2: 26 mmol/L (ref 20–32)
Calcium: 9 mg/dL (ref 8.6–10.4)
Chloride: 110 mmol/L (ref 98–110)
Creat: 0.79 mg/dL (ref 0.60–0.88)
GFR, Est African American: 80 mL/min/{1.73_m2} (ref >=60)
GFR, Est Non African American: 69 mL/min/{1.73_m2} (ref >=60)
Globulin: 2.1 g/dL (calc) (ref 1.9–3.7)
Glucose, Bld: 75 mg/dL (ref 65–99)
Potassium: 3.8 mmol/L (ref 3.5–5.3)
Sodium: 143 mmol/L (ref 135–146)
Total Bilirubin: 0.6 mg/dL (ref 0.2–1.2)
Total Protein: 5.8 g/dL — ABNORMAL LOW (ref 6.1–8.1)

## 2019-05-10 LAB — LIPID PANEL
Cholesterol: 127 mg/dL (ref <200)
HDL: 37 mg/dL — ABNORMAL LOW (ref >=50)
LDL Cholesterol (Calc): 69 mg/dL (calc)
Non-HDL Cholesterol (Calc): 90 mg/dL (calc) (ref <130)
Total CHOL/HDL Ratio: 3.4 (calc) (ref <5.0)
Triglycerides: 120 mg/dL (ref <150)

## 2019-05-10 LAB — CBC WITH DIFFERENTIAL/PLATELET
Absolute Monocytes: 445 cells/uL (ref 200–950)
Basophils Absolute: 29 cells/uL (ref 0–200)
Basophils Relative: 0.5 %
Eosinophils Absolute: 177 cells/uL (ref 15–500)
Eosinophils Relative: 3.1 %
HCT: 39.3 % (ref 35.0–45.0)
Hemoglobin: 13.1 g/dL (ref 11.7–15.5)
Lymphs Abs: 1243 cells/uL (ref 850–3900)
MCH: 32.3 pg (ref 27.0–33.0)
MCHC: 33.3 g/dL (ref 32.0–36.0)
MCV: 97 fL (ref 80.0–100.0)
MPV: 10.7 fL (ref 7.5–12.5)
Monocytes Relative: 7.8 %
Neutro Abs: 3808 cells/uL (ref 1500–7800)
Neutrophils Relative %: 66.8 %
Platelets: 193 10*3/uL (ref 140–400)
RBC: 4.05 10*6/uL (ref 3.80–5.10)
RDW: 13.3 % (ref 11.0–15.0)
Total Lymphocyte: 21.8 %
WBC: 5.7 10*3/uL (ref 3.8–10.8)

## 2019-05-10 LAB — IRON, TOTAL/TOTAL IRON BINDING CAP
%SAT: 27 % (calc) (ref 16–45)
Iron: 90 ug/dL (ref 45–160)
TIBC: 335 mcg/dL (calc) (ref 250–450)

## 2019-05-10 LAB — TSH: TSH: 1.58 mIU/L (ref 0.40–4.50)

## 2019-05-10 LAB — FERRITIN: Ferritin: 46 ng/mL (ref 16–288)

## 2019-05-10 LAB — VITAMIN B12: Vitamin B-12: >2000 pg/mL — ABNORMAL HIGH (ref 200–1100)

## 2019-05-10 LAB — MAGNESIUM: Magnesium: 1.6 mg/dL (ref 1.5–2.5)

## 2019-05-10 LAB — VITAMIN D 25 HYDROXY (VIT D DEFICIENCY, FRACTURES): Vit D, 25-Hydroxy: 45 ng/mL (ref 30–100)

## 2019-05-11 ENCOUNTER — Ambulatory Visit (INDEPENDENT_AMBULATORY_CARE_PROVIDER_SITE_OTHER): Payer: Medicare Other | Admitting: Internal Medicine

## 2019-05-11 DIAGNOSIS — Z5181 Encounter for therapeutic drug level monitoring: Secondary | ICD-10-CM | POA: Diagnosis not present

## 2019-05-11 DIAGNOSIS — J301 Allergic rhinitis due to pollen: Secondary | ICD-10-CM | POA: Diagnosis not present

## 2019-05-11 DIAGNOSIS — Z85028 Personal history of other malignant neoplasm of stomach: Secondary | ICD-10-CM | POA: Diagnosis not present

## 2019-05-11 DIAGNOSIS — I083 Combined rheumatic disorders of mitral, aortic and tricuspid valves: Secondary | ICD-10-CM | POA: Diagnosis not present

## 2019-05-11 DIAGNOSIS — K3184 Gastroparesis: Secondary | ICD-10-CM | POA: Diagnosis not present

## 2019-05-11 DIAGNOSIS — Z950 Presence of cardiac pacemaker: Secondary | ICD-10-CM | POA: Diagnosis not present

## 2019-05-11 DIAGNOSIS — E875 Hyperkalemia: Secondary | ICD-10-CM | POA: Diagnosis not present

## 2019-05-11 DIAGNOSIS — N183 Chronic kidney disease, stage 3 (moderate): Secondary | ICD-10-CM | POA: Diagnosis not present

## 2019-05-11 DIAGNOSIS — I272 Pulmonary hypertension, unspecified: Secondary | ICD-10-CM | POA: Diagnosis not present

## 2019-05-11 DIAGNOSIS — Z7901 Long term (current) use of anticoagulants: Secondary | ICD-10-CM

## 2019-05-11 DIAGNOSIS — G5791 Unspecified mononeuropathy of right lower limb: Secondary | ICD-10-CM | POA: Diagnosis not present

## 2019-05-11 DIAGNOSIS — H5462 Unqualified visual loss, left eye, normal vision right eye: Secondary | ICD-10-CM | POA: Diagnosis not present

## 2019-05-11 DIAGNOSIS — I480 Paroxysmal atrial fibrillation: Secondary | ICD-10-CM

## 2019-05-11 DIAGNOSIS — I495 Sick sinus syndrome: Secondary | ICD-10-CM | POA: Diagnosis not present

## 2019-05-11 DIAGNOSIS — Z8553 Personal history of malignant neoplasm of renal pelvis: Secondary | ICD-10-CM | POA: Diagnosis not present

## 2019-05-11 DIAGNOSIS — Z85038 Personal history of other malignant neoplasm of large intestine: Secondary | ICD-10-CM | POA: Diagnosis not present

## 2019-05-11 DIAGNOSIS — I130 Hypertensive heart and chronic kidney disease with heart failure and stage 1 through stage 4 chronic kidney disease, or unspecified chronic kidney disease: Secondary | ICD-10-CM | POA: Diagnosis not present

## 2019-05-11 DIAGNOSIS — E559 Vitamin D deficiency, unspecified: Secondary | ICD-10-CM | POA: Diagnosis not present

## 2019-05-11 DIAGNOSIS — H6523 Chronic serous otitis media, bilateral: Secondary | ICD-10-CM | POA: Diagnosis not present

## 2019-05-11 DIAGNOSIS — G2581 Restless legs syndrome: Secondary | ICD-10-CM | POA: Diagnosis not present

## 2019-05-11 DIAGNOSIS — E039 Hypothyroidism, unspecified: Secondary | ICD-10-CM | POA: Diagnosis not present

## 2019-05-11 DIAGNOSIS — I5033 Acute on chronic diastolic (congestive) heart failure: Secondary | ICD-10-CM | POA: Diagnosis not present

## 2019-05-11 DIAGNOSIS — I2510 Atherosclerotic heart disease of native coronary artery without angina pectoris: Secondary | ICD-10-CM | POA: Diagnosis not present

## 2019-05-11 DIAGNOSIS — K219 Gastro-esophageal reflux disease without esophagitis: Secondary | ICD-10-CM | POA: Diagnosis not present

## 2019-05-11 LAB — POCT INR: INR: 2.1 (ref 2.0–3.0)

## 2019-05-15 DIAGNOSIS — C672 Malignant neoplasm of lateral wall of bladder: Secondary | ICD-10-CM | POA: Diagnosis not present

## 2019-05-16 DIAGNOSIS — K3184 Gastroparesis: Secondary | ICD-10-CM | POA: Diagnosis not present

## 2019-05-16 DIAGNOSIS — N183 Chronic kidney disease, stage 3 (moderate): Secondary | ICD-10-CM | POA: Diagnosis not present

## 2019-05-16 DIAGNOSIS — E039 Hypothyroidism, unspecified: Secondary | ICD-10-CM | POA: Diagnosis not present

## 2019-05-16 DIAGNOSIS — I480 Paroxysmal atrial fibrillation: Secondary | ICD-10-CM | POA: Diagnosis not present

## 2019-05-16 DIAGNOSIS — H6523 Chronic serous otitis media, bilateral: Secondary | ICD-10-CM | POA: Diagnosis not present

## 2019-05-16 DIAGNOSIS — G5791 Unspecified mononeuropathy of right lower limb: Secondary | ICD-10-CM | POA: Diagnosis not present

## 2019-05-16 DIAGNOSIS — Z950 Presence of cardiac pacemaker: Secondary | ICD-10-CM | POA: Diagnosis not present

## 2019-05-16 DIAGNOSIS — K219 Gastro-esophageal reflux disease without esophagitis: Secondary | ICD-10-CM | POA: Diagnosis not present

## 2019-05-16 DIAGNOSIS — E875 Hyperkalemia: Secondary | ICD-10-CM | POA: Diagnosis not present

## 2019-05-16 DIAGNOSIS — G2581 Restless legs syndrome: Secondary | ICD-10-CM | POA: Diagnosis not present

## 2019-05-16 DIAGNOSIS — Z8553 Personal history of malignant neoplasm of renal pelvis: Secondary | ICD-10-CM | POA: Diagnosis not present

## 2019-05-16 DIAGNOSIS — I083 Combined rheumatic disorders of mitral, aortic and tricuspid valves: Secondary | ICD-10-CM | POA: Diagnosis not present

## 2019-05-16 DIAGNOSIS — H5462 Unqualified visual loss, left eye, normal vision right eye: Secondary | ICD-10-CM | POA: Diagnosis not present

## 2019-05-16 DIAGNOSIS — Z5181 Encounter for therapeutic drug level monitoring: Secondary | ICD-10-CM | POA: Diagnosis not present

## 2019-05-16 DIAGNOSIS — I495 Sick sinus syndrome: Secondary | ICD-10-CM | POA: Diagnosis not present

## 2019-05-16 DIAGNOSIS — I5033 Acute on chronic diastolic (congestive) heart failure: Secondary | ICD-10-CM | POA: Diagnosis not present

## 2019-05-16 DIAGNOSIS — Z85028 Personal history of other malignant neoplasm of stomach: Secondary | ICD-10-CM | POA: Diagnosis not present

## 2019-05-16 DIAGNOSIS — Z85038 Personal history of other malignant neoplasm of large intestine: Secondary | ICD-10-CM | POA: Diagnosis not present

## 2019-05-16 DIAGNOSIS — I272 Pulmonary hypertension, unspecified: Secondary | ICD-10-CM | POA: Diagnosis not present

## 2019-05-16 DIAGNOSIS — I130 Hypertensive heart and chronic kidney disease with heart failure and stage 1 through stage 4 chronic kidney disease, or unspecified chronic kidney disease: Secondary | ICD-10-CM | POA: Diagnosis not present

## 2019-05-16 DIAGNOSIS — E559 Vitamin D deficiency, unspecified: Secondary | ICD-10-CM | POA: Diagnosis not present

## 2019-05-16 DIAGNOSIS — Z7901 Long term (current) use of anticoagulants: Secondary | ICD-10-CM | POA: Diagnosis not present

## 2019-05-16 DIAGNOSIS — I2510 Atherosclerotic heart disease of native coronary artery without angina pectoris: Secondary | ICD-10-CM | POA: Diagnosis not present

## 2019-05-16 DIAGNOSIS — J301 Allergic rhinitis due to pollen: Secondary | ICD-10-CM | POA: Diagnosis not present

## 2019-05-21 ENCOUNTER — Other Ambulatory Visit: Payer: Self-pay | Admitting: Internal Medicine

## 2019-05-22 DIAGNOSIS — Z85028 Personal history of other malignant neoplasm of stomach: Secondary | ICD-10-CM | POA: Diagnosis not present

## 2019-05-22 DIAGNOSIS — G2581 Restless legs syndrome: Secondary | ICD-10-CM | POA: Diagnosis not present

## 2019-05-22 DIAGNOSIS — I272 Pulmonary hypertension, unspecified: Secondary | ICD-10-CM | POA: Diagnosis not present

## 2019-05-22 DIAGNOSIS — N183 Chronic kidney disease, stage 3 (moderate): Secondary | ICD-10-CM | POA: Diagnosis not present

## 2019-05-22 DIAGNOSIS — H6523 Chronic serous otitis media, bilateral: Secondary | ICD-10-CM | POA: Diagnosis not present

## 2019-05-22 DIAGNOSIS — I495 Sick sinus syndrome: Secondary | ICD-10-CM | POA: Diagnosis not present

## 2019-05-22 DIAGNOSIS — Z7901 Long term (current) use of anticoagulants: Secondary | ICD-10-CM | POA: Diagnosis not present

## 2019-05-22 DIAGNOSIS — K219 Gastro-esophageal reflux disease without esophagitis: Secondary | ICD-10-CM | POA: Diagnosis not present

## 2019-05-22 DIAGNOSIS — H5462 Unqualified visual loss, left eye, normal vision right eye: Secondary | ICD-10-CM | POA: Diagnosis not present

## 2019-05-22 DIAGNOSIS — E875 Hyperkalemia: Secondary | ICD-10-CM | POA: Diagnosis not present

## 2019-05-22 DIAGNOSIS — I083 Combined rheumatic disorders of mitral, aortic and tricuspid valves: Secondary | ICD-10-CM | POA: Diagnosis not present

## 2019-05-22 DIAGNOSIS — Z85038 Personal history of other malignant neoplasm of large intestine: Secondary | ICD-10-CM | POA: Diagnosis not present

## 2019-05-22 DIAGNOSIS — Z8553 Personal history of malignant neoplasm of renal pelvis: Secondary | ICD-10-CM | POA: Diagnosis not present

## 2019-05-22 DIAGNOSIS — K3184 Gastroparesis: Secondary | ICD-10-CM | POA: Diagnosis not present

## 2019-05-22 DIAGNOSIS — E559 Vitamin D deficiency, unspecified: Secondary | ICD-10-CM | POA: Diagnosis not present

## 2019-05-22 DIAGNOSIS — E039 Hypothyroidism, unspecified: Secondary | ICD-10-CM | POA: Diagnosis not present

## 2019-05-22 DIAGNOSIS — Z5181 Encounter for therapeutic drug level monitoring: Secondary | ICD-10-CM | POA: Diagnosis not present

## 2019-05-22 DIAGNOSIS — I5033 Acute on chronic diastolic (congestive) heart failure: Secondary | ICD-10-CM | POA: Diagnosis not present

## 2019-05-22 DIAGNOSIS — I480 Paroxysmal atrial fibrillation: Secondary | ICD-10-CM | POA: Diagnosis not present

## 2019-05-22 DIAGNOSIS — I2510 Atherosclerotic heart disease of native coronary artery without angina pectoris: Secondary | ICD-10-CM | POA: Diagnosis not present

## 2019-05-22 DIAGNOSIS — I130 Hypertensive heart and chronic kidney disease with heart failure and stage 1 through stage 4 chronic kidney disease, or unspecified chronic kidney disease: Secondary | ICD-10-CM | POA: Diagnosis not present

## 2019-05-22 DIAGNOSIS — Z950 Presence of cardiac pacemaker: Secondary | ICD-10-CM | POA: Diagnosis not present

## 2019-05-22 DIAGNOSIS — G5791 Unspecified mononeuropathy of right lower limb: Secondary | ICD-10-CM | POA: Diagnosis not present

## 2019-05-22 DIAGNOSIS — J301 Allergic rhinitis due to pollen: Secondary | ICD-10-CM | POA: Diagnosis not present

## 2019-05-28 ENCOUNTER — Other Ambulatory Visit: Payer: Self-pay | Admitting: Internal Medicine

## 2019-05-29 ENCOUNTER — Other Ambulatory Visit: Payer: Self-pay | Admitting: Internal Medicine

## 2019-05-31 ENCOUNTER — Ambulatory Visit (INDEPENDENT_AMBULATORY_CARE_PROVIDER_SITE_OTHER): Payer: Medicare Other | Admitting: *Deleted

## 2019-05-31 DIAGNOSIS — I495 Sick sinus syndrome: Secondary | ICD-10-CM | POA: Diagnosis not present

## 2019-05-31 LAB — CUP PACEART REMOTE DEVICE CHECK
Battery Impedance: 1925 Ohm
Battery Remaining Longevity: 36 mo
Battery Voltage: 2.78 V
Brady Statistic AP VP Percent: 0 %
Brady Statistic AP VS Percent: 65 %
Brady Statistic AS VP Percent: 0 %
Brady Statistic AS VS Percent: 35 %
Date Time Interrogation Session: 20200924155932
Implantable Lead Implant Date: 20111027
Implantable Lead Implant Date: 20111027
Implantable Lead Location: 753859
Implantable Lead Location: 753860
Implantable Lead Model: 4469
Implantable Lead Model: 4470
Implantable Lead Serial Number: 548229
Implantable Lead Serial Number: 686026
Implantable Pulse Generator Implant Date: 20111027
Lead Channel Impedance Value: 384 Ohm
Lead Channel Impedance Value: 465 Ohm
Lead Channel Pacing Threshold Amplitude: 0.375 V
Lead Channel Pacing Threshold Amplitude: 1.25 V
Lead Channel Pacing Threshold Pulse Width: 0.4 ms
Lead Channel Pacing Threshold Pulse Width: 0.4 ms
Lead Channel Sensing Intrinsic Amplitude: 1 mV
Lead Channel Sensing Intrinsic Amplitude: 5.6 mV
Lead Channel Setting Pacing Amplitude: 2 V
Lead Channel Setting Pacing Amplitude: 3.25 V
Lead Channel Setting Pacing Pulse Width: 0.4 ms
Lead Channel Setting Sensing Sensitivity: 2 mV

## 2019-06-07 ENCOUNTER — Encounter: Payer: Self-pay | Admitting: Cardiology

## 2019-06-07 NOTE — Progress Notes (Signed)
Remote pacemaker transmission.   

## 2019-06-08 ENCOUNTER — Ambulatory Visit (INDEPENDENT_AMBULATORY_CARE_PROVIDER_SITE_OTHER): Payer: Medicare Other | Admitting: Interventional Cardiology

## 2019-06-08 DIAGNOSIS — H5462 Unqualified visual loss, left eye, normal vision right eye: Secondary | ICD-10-CM | POA: Diagnosis not present

## 2019-06-08 DIAGNOSIS — H6523 Chronic serous otitis media, bilateral: Secondary | ICD-10-CM | POA: Diagnosis not present

## 2019-06-08 DIAGNOSIS — Z8553 Personal history of malignant neoplasm of renal pelvis: Secondary | ICD-10-CM | POA: Diagnosis not present

## 2019-06-08 DIAGNOSIS — I480 Paroxysmal atrial fibrillation: Secondary | ICD-10-CM

## 2019-06-08 DIAGNOSIS — Z7901 Long term (current) use of anticoagulants: Secondary | ICD-10-CM

## 2019-06-08 DIAGNOSIS — K3184 Gastroparesis: Secondary | ICD-10-CM | POA: Diagnosis not present

## 2019-06-08 DIAGNOSIS — I272 Pulmonary hypertension, unspecified: Secondary | ICD-10-CM | POA: Diagnosis not present

## 2019-06-08 DIAGNOSIS — E559 Vitamin D deficiency, unspecified: Secondary | ICD-10-CM | POA: Diagnosis not present

## 2019-06-08 DIAGNOSIS — I130 Hypertensive heart and chronic kidney disease with heart failure and stage 1 through stage 4 chronic kidney disease, or unspecified chronic kidney disease: Secondary | ICD-10-CM | POA: Diagnosis not present

## 2019-06-08 DIAGNOSIS — I2510 Atherosclerotic heart disease of native coronary artery without angina pectoris: Secondary | ICD-10-CM | POA: Diagnosis not present

## 2019-06-08 DIAGNOSIS — I5033 Acute on chronic diastolic (congestive) heart failure: Secondary | ICD-10-CM | POA: Diagnosis not present

## 2019-06-08 DIAGNOSIS — Z5181 Encounter for therapeutic drug level monitoring: Secondary | ICD-10-CM | POA: Diagnosis not present

## 2019-06-08 DIAGNOSIS — I083 Combined rheumatic disorders of mitral, aortic and tricuspid valves: Secondary | ICD-10-CM | POA: Diagnosis not present

## 2019-06-08 DIAGNOSIS — E039 Hypothyroidism, unspecified: Secondary | ICD-10-CM | POA: Diagnosis not present

## 2019-06-08 DIAGNOSIS — Z85038 Personal history of other malignant neoplasm of large intestine: Secondary | ICD-10-CM | POA: Diagnosis not present

## 2019-06-08 DIAGNOSIS — Z950 Presence of cardiac pacemaker: Secondary | ICD-10-CM | POA: Diagnosis not present

## 2019-06-08 DIAGNOSIS — K219 Gastro-esophageal reflux disease without esophagitis: Secondary | ICD-10-CM | POA: Diagnosis not present

## 2019-06-08 DIAGNOSIS — N183 Chronic kidney disease, stage 3 unspecified: Secondary | ICD-10-CM | POA: Diagnosis not present

## 2019-06-08 DIAGNOSIS — G2581 Restless legs syndrome: Secondary | ICD-10-CM | POA: Diagnosis not present

## 2019-06-08 DIAGNOSIS — Z85028 Personal history of other malignant neoplasm of stomach: Secondary | ICD-10-CM | POA: Diagnosis not present

## 2019-06-08 DIAGNOSIS — G5791 Unspecified mononeuropathy of right lower limb: Secondary | ICD-10-CM | POA: Diagnosis not present

## 2019-06-08 DIAGNOSIS — J301 Allergic rhinitis due to pollen: Secondary | ICD-10-CM | POA: Diagnosis not present

## 2019-06-08 DIAGNOSIS — I495 Sick sinus syndrome: Secondary | ICD-10-CM | POA: Diagnosis not present

## 2019-06-08 DIAGNOSIS — E875 Hyperkalemia: Secondary | ICD-10-CM | POA: Diagnosis not present

## 2019-06-08 LAB — POCT INR: INR: 1.9 — AB (ref 2.0–3.0)

## 2019-06-08 NOTE — Patient Instructions (Signed)
Description   Spoke with [place] LPN with [facility], advised pt to take 1.5 tablets today then continue taking 1 tablet daily. Recheck INR in 3 weeks. Call us with any medication changes or concerns # [phone].

## 2019-06-22 DIAGNOSIS — K3184 Gastroparesis: Secondary | ICD-10-CM | POA: Diagnosis not present

## 2019-06-22 DIAGNOSIS — I480 Paroxysmal atrial fibrillation: Secondary | ICD-10-CM | POA: Diagnosis not present

## 2019-06-22 DIAGNOSIS — I495 Sick sinus syndrome: Secondary | ICD-10-CM | POA: Diagnosis not present

## 2019-06-22 DIAGNOSIS — Z85038 Personal history of other malignant neoplasm of large intestine: Secondary | ICD-10-CM | POA: Diagnosis not present

## 2019-06-22 DIAGNOSIS — E875 Hyperkalemia: Secondary | ICD-10-CM | POA: Diagnosis not present

## 2019-06-22 DIAGNOSIS — I272 Pulmonary hypertension, unspecified: Secondary | ICD-10-CM | POA: Diagnosis not present

## 2019-06-22 DIAGNOSIS — N183 Chronic kidney disease, stage 3 unspecified: Secondary | ICD-10-CM | POA: Diagnosis not present

## 2019-06-22 DIAGNOSIS — J301 Allergic rhinitis due to pollen: Secondary | ICD-10-CM | POA: Diagnosis not present

## 2019-06-22 DIAGNOSIS — G2581 Restless legs syndrome: Secondary | ICD-10-CM | POA: Diagnosis not present

## 2019-06-22 DIAGNOSIS — G5791 Unspecified mononeuropathy of right lower limb: Secondary | ICD-10-CM | POA: Diagnosis not present

## 2019-06-22 DIAGNOSIS — I5033 Acute on chronic diastolic (congestive) heart failure: Secondary | ICD-10-CM | POA: Diagnosis not present

## 2019-06-22 DIAGNOSIS — Z85028 Personal history of other malignant neoplasm of stomach: Secondary | ICD-10-CM | POA: Diagnosis not present

## 2019-06-22 DIAGNOSIS — I130 Hypertensive heart and chronic kidney disease with heart failure and stage 1 through stage 4 chronic kidney disease, or unspecified chronic kidney disease: Secondary | ICD-10-CM | POA: Diagnosis not present

## 2019-06-22 DIAGNOSIS — H6523 Chronic serous otitis media, bilateral: Secondary | ICD-10-CM | POA: Diagnosis not present

## 2019-06-22 DIAGNOSIS — H5462 Unqualified visual loss, left eye, normal vision right eye: Secondary | ICD-10-CM | POA: Diagnosis not present

## 2019-06-22 DIAGNOSIS — K219 Gastro-esophageal reflux disease without esophagitis: Secondary | ICD-10-CM | POA: Diagnosis not present

## 2019-06-22 DIAGNOSIS — Z7901 Long term (current) use of anticoagulants: Secondary | ICD-10-CM | POA: Diagnosis not present

## 2019-06-22 DIAGNOSIS — I2510 Atherosclerotic heart disease of native coronary artery without angina pectoris: Secondary | ICD-10-CM | POA: Diagnosis not present

## 2019-06-22 DIAGNOSIS — E039 Hypothyroidism, unspecified: Secondary | ICD-10-CM | POA: Diagnosis not present

## 2019-06-22 DIAGNOSIS — Z5181 Encounter for therapeutic drug level monitoring: Secondary | ICD-10-CM | POA: Diagnosis not present

## 2019-06-22 DIAGNOSIS — Z950 Presence of cardiac pacemaker: Secondary | ICD-10-CM | POA: Diagnosis not present

## 2019-06-22 DIAGNOSIS — Z8553 Personal history of malignant neoplasm of renal pelvis: Secondary | ICD-10-CM | POA: Diagnosis not present

## 2019-06-22 DIAGNOSIS — I083 Combined rheumatic disorders of mitral, aortic and tricuspid valves: Secondary | ICD-10-CM | POA: Diagnosis not present

## 2019-06-22 DIAGNOSIS — E559 Vitamin D deficiency, unspecified: Secondary | ICD-10-CM | POA: Diagnosis not present

## 2019-06-29 ENCOUNTER — Ambulatory Visit (INDEPENDENT_AMBULATORY_CARE_PROVIDER_SITE_OTHER): Payer: Medicare Other | Admitting: Cardiovascular Disease

## 2019-06-29 DIAGNOSIS — I480 Paroxysmal atrial fibrillation: Secondary | ICD-10-CM | POA: Diagnosis not present

## 2019-06-29 DIAGNOSIS — Z85028 Personal history of other malignant neoplasm of stomach: Secondary | ICD-10-CM | POA: Diagnosis not present

## 2019-06-29 DIAGNOSIS — G5791 Unspecified mononeuropathy of right lower limb: Secondary | ICD-10-CM | POA: Diagnosis not present

## 2019-06-29 DIAGNOSIS — Z5181 Encounter for therapeutic drug level monitoring: Secondary | ICD-10-CM | POA: Diagnosis not present

## 2019-06-29 DIAGNOSIS — Z85038 Personal history of other malignant neoplasm of large intestine: Secondary | ICD-10-CM | POA: Diagnosis not present

## 2019-06-29 DIAGNOSIS — I272 Pulmonary hypertension, unspecified: Secondary | ICD-10-CM | POA: Diagnosis not present

## 2019-06-29 DIAGNOSIS — I130 Hypertensive heart and chronic kidney disease with heart failure and stage 1 through stage 4 chronic kidney disease, or unspecified chronic kidney disease: Secondary | ICD-10-CM | POA: Diagnosis not present

## 2019-06-29 DIAGNOSIS — E559 Vitamin D deficiency, unspecified: Secondary | ICD-10-CM | POA: Diagnosis not present

## 2019-06-29 DIAGNOSIS — G2581 Restless legs syndrome: Secondary | ICD-10-CM | POA: Diagnosis not present

## 2019-06-29 DIAGNOSIS — H6523 Chronic serous otitis media, bilateral: Secondary | ICD-10-CM | POA: Diagnosis not present

## 2019-06-29 DIAGNOSIS — J301 Allergic rhinitis due to pollen: Secondary | ICD-10-CM | POA: Diagnosis not present

## 2019-06-29 DIAGNOSIS — N183 Chronic kidney disease, stage 3 unspecified: Secondary | ICD-10-CM | POA: Diagnosis not present

## 2019-06-29 DIAGNOSIS — Z950 Presence of cardiac pacemaker: Secondary | ICD-10-CM | POA: Diagnosis not present

## 2019-06-29 DIAGNOSIS — K3184 Gastroparesis: Secondary | ICD-10-CM | POA: Diagnosis not present

## 2019-06-29 DIAGNOSIS — E039 Hypothyroidism, unspecified: Secondary | ICD-10-CM | POA: Diagnosis not present

## 2019-06-29 DIAGNOSIS — Z7901 Long term (current) use of anticoagulants: Secondary | ICD-10-CM

## 2019-06-29 DIAGNOSIS — Z8553 Personal history of malignant neoplasm of renal pelvis: Secondary | ICD-10-CM | POA: Diagnosis not present

## 2019-06-29 DIAGNOSIS — I5033 Acute on chronic diastolic (congestive) heart failure: Secondary | ICD-10-CM | POA: Diagnosis not present

## 2019-06-29 DIAGNOSIS — I2510 Atherosclerotic heart disease of native coronary artery without angina pectoris: Secondary | ICD-10-CM | POA: Diagnosis not present

## 2019-06-29 DIAGNOSIS — I495 Sick sinus syndrome: Secondary | ICD-10-CM | POA: Diagnosis not present

## 2019-06-29 DIAGNOSIS — K219 Gastro-esophageal reflux disease without esophagitis: Secondary | ICD-10-CM | POA: Diagnosis not present

## 2019-06-29 DIAGNOSIS — E875 Hyperkalemia: Secondary | ICD-10-CM | POA: Diagnosis not present

## 2019-06-29 DIAGNOSIS — H5462 Unqualified visual loss, left eye, normal vision right eye: Secondary | ICD-10-CM | POA: Diagnosis not present

## 2019-06-29 DIAGNOSIS — I083 Combined rheumatic disorders of mitral, aortic and tricuspid valves: Secondary | ICD-10-CM | POA: Diagnosis not present

## 2019-06-29 LAB — POCT INR: INR: 2.1 (ref 2.0–3.0)

## 2019-06-29 NOTE — Patient Instructions (Signed)
Description   Spoke with [place] LPN with [facility], advised pt to continue taking 1 tablet daily. Recheck INR in 3 weeks. Call us with any medication changes or concerns # [phone].

## 2019-07-03 DIAGNOSIS — H6523 Chronic serous otitis media, bilateral: Secondary | ICD-10-CM | POA: Diagnosis not present

## 2019-07-03 DIAGNOSIS — H5462 Unqualified visual loss, left eye, normal vision right eye: Secondary | ICD-10-CM | POA: Diagnosis not present

## 2019-07-03 DIAGNOSIS — Z950 Presence of cardiac pacemaker: Secondary | ICD-10-CM | POA: Diagnosis not present

## 2019-07-03 DIAGNOSIS — N183 Chronic kidney disease, stage 3 unspecified: Secondary | ICD-10-CM | POA: Diagnosis not present

## 2019-07-03 DIAGNOSIS — K3184 Gastroparesis: Secondary | ICD-10-CM | POA: Diagnosis not present

## 2019-07-03 DIAGNOSIS — I5033 Acute on chronic diastolic (congestive) heart failure: Secondary | ICD-10-CM | POA: Diagnosis not present

## 2019-07-03 DIAGNOSIS — Z8553 Personal history of malignant neoplasm of renal pelvis: Secondary | ICD-10-CM | POA: Diagnosis not present

## 2019-07-03 DIAGNOSIS — Z85038 Personal history of other malignant neoplasm of large intestine: Secondary | ICD-10-CM | POA: Diagnosis not present

## 2019-07-03 DIAGNOSIS — I083 Combined rheumatic disorders of mitral, aortic and tricuspid valves: Secondary | ICD-10-CM | POA: Diagnosis not present

## 2019-07-03 DIAGNOSIS — Z5181 Encounter for therapeutic drug level monitoring: Secondary | ICD-10-CM | POA: Diagnosis not present

## 2019-07-03 DIAGNOSIS — E559 Vitamin D deficiency, unspecified: Secondary | ICD-10-CM | POA: Diagnosis not present

## 2019-07-03 DIAGNOSIS — G2581 Restless legs syndrome: Secondary | ICD-10-CM | POA: Diagnosis not present

## 2019-07-03 DIAGNOSIS — K219 Gastro-esophageal reflux disease without esophagitis: Secondary | ICD-10-CM | POA: Diagnosis not present

## 2019-07-03 DIAGNOSIS — J301 Allergic rhinitis due to pollen: Secondary | ICD-10-CM | POA: Diagnosis not present

## 2019-07-03 DIAGNOSIS — I130 Hypertensive heart and chronic kidney disease with heart failure and stage 1 through stage 4 chronic kidney disease, or unspecified chronic kidney disease: Secondary | ICD-10-CM | POA: Diagnosis not present

## 2019-07-03 DIAGNOSIS — I2510 Atherosclerotic heart disease of native coronary artery without angina pectoris: Secondary | ICD-10-CM | POA: Diagnosis not present

## 2019-07-03 DIAGNOSIS — E875 Hyperkalemia: Secondary | ICD-10-CM | POA: Diagnosis not present

## 2019-07-03 DIAGNOSIS — Z85028 Personal history of other malignant neoplasm of stomach: Secondary | ICD-10-CM | POA: Diagnosis not present

## 2019-07-03 DIAGNOSIS — G5791 Unspecified mononeuropathy of right lower limb: Secondary | ICD-10-CM | POA: Diagnosis not present

## 2019-07-03 DIAGNOSIS — I495 Sick sinus syndrome: Secondary | ICD-10-CM | POA: Diagnosis not present

## 2019-07-03 DIAGNOSIS — I480 Paroxysmal atrial fibrillation: Secondary | ICD-10-CM | POA: Diagnosis not present

## 2019-07-03 DIAGNOSIS — E039 Hypothyroidism, unspecified: Secondary | ICD-10-CM | POA: Diagnosis not present

## 2019-07-03 DIAGNOSIS — Z7901 Long term (current) use of anticoagulants: Secondary | ICD-10-CM | POA: Diagnosis not present

## 2019-07-03 DIAGNOSIS — I272 Pulmonary hypertension, unspecified: Secondary | ICD-10-CM | POA: Diagnosis not present

## 2019-07-04 ENCOUNTER — Encounter: Payer: Self-pay | Admitting: Physician Assistant

## 2019-07-04 ENCOUNTER — Ambulatory Visit (INDEPENDENT_AMBULATORY_CARE_PROVIDER_SITE_OTHER): Payer: Medicare Other | Admitting: Physician Assistant

## 2019-07-04 ENCOUNTER — Other Ambulatory Visit: Payer: Self-pay

## 2019-07-04 ENCOUNTER — Ambulatory Visit
Admission: RE | Admit: 2019-07-04 | Discharge: 2019-07-04 | Disposition: A | Discharge status: Home / Self Care | Payer: Medicare Other | Source: Ambulatory Visit | Attending: Physician Assistant | Admitting: Physician Assistant

## 2019-07-04 VITALS — BP 162/90 | HR 73 | Temp 97.3°F | Wt 99.2 lb

## 2019-07-04 DIAGNOSIS — C4490 Unspecified malignant neoplasm of skin, unspecified: Secondary | ICD-10-CM

## 2019-07-04 DIAGNOSIS — M7989 Other specified soft tissue disorders: Secondary | ICD-10-CM | POA: Diagnosis not present

## 2019-07-04 DIAGNOSIS — Z79899 Other long term (current) drug therapy: Secondary | ICD-10-CM | POA: Diagnosis not present

## 2019-07-04 DIAGNOSIS — M79675 Pain in left toe(s): Secondary | ICD-10-CM | POA: Diagnosis not present

## 2019-07-04 NOTE — Patient Instructions (Addendum)
INFORMATION ABOUT YOUR XRAY  Can walk into [address] for an Insurance account manager. They will have the order and take you back. You do not any paper work, I should get the result back today or tomorrow. This order is good for a year.  Can call [phone] to schedule an appointment if you wish.    Paronychia Paronychia is an infection of the skin that surrounds a nail. It usually affects the skin around a fingernail, but it may also occur near a toenail. It often causes pain and swelling around the nail. In some cases, a collection of pus (abscess) can form near or under the nail.  This condition may develop suddenly, or it may develop gradually over a longer period. In most cases, paronychia is not serious, and it will clear up with treatment. What are the causes? This condition may be caused by bacteria or a fungus. These germs can enter the body through an opening in the skin, such as a cut or a hangnail. What increases the risk? This condition is more likely to develop in people who:  Get their hands wet often, such as those who work as Designer, industrial/product, bartenders, or nurses.  Bite their fingernails or suck their thumbs.  Trim their nails very short.  Have hangnails or injured fingertips.  Get manicures.  Have diabetes. What are the signs or symptoms? Symptoms of this condition include:  Redness and swelling of the skin near the nail.  Tenderness around the nail when you touch the area.  Pus-filled bumps under the skin at the base and sides of the nail (cuticle).  Fluid or pus under the nail.  Throbbing pain in the area. How is this diagnosed? This condition is diagnosed with a physical exam. In some cases, a sample of pus may be tested to determine what type of bacteria or fungus is causing the condition. How is this treated? Treatment depends on the cause and severity of your condition. If your condition is mild, it may clear up on its own in a few days or after soaking in  warm water. If needed, treatment may include:  Antibiotic medicine, if your infection is caused by bacteria.  Antifungal medicine, if your infection is caused by a fungus.  A procedure to drain pus from an abscess.  Anti-inflammatory medicine (corticosteroids). Follow these instructions at home: Wound care  Keep the affected area clean.  Soak the affected area in warm water, if told to do so by your health care provider. You may be told to do this for 20 minutes, 2-3 times a day.  Keep the area dry when you are not soaking it.  Do not try to drain an abscess yourself.  Follow instructions from your health care provider about how to take care of the affected area. Make sure you: ? Wash your hands with soap and water before you change your bandage (dressing). If soap and water are not available, use hand sanitizer. ? Change your dressing as told by your health care provider.  If you had an abscess drained, check the area every day for signs of infection. Check for: ? Redness, swelling, or pain. ? Fluid or blood. ? Warmth. ? Pus or a bad smell. Medicines   Take over-the-counter and prescription medicines only as told by your health care provider.  If you were prescribed an antibiotic medicine, take it as told by your health care provider. Do not stop taking the antibiotic even if you start to feel better.  General instructions  Avoid contact with harsh chemicals.  Do not pick at the affected area. Prevention  To prevent this condition from happening again: ? Wear rubber gloves when washing dishes or doing other tasks that require your hands to get wet. ? Wear gloves if your hands might come in contact with cleaners or other chemicals. ? Avoid injuring your nails or fingertips. ? Do not bite your nails or tear hangnails. ? Do not cut your nails very short. ? Do not cut your cuticles. ? Use clean nail clippers or scissors when trimming nails. Contact a health care  provider if:  Your symptoms get worse or do not improve with treatment.  You have continued or increased fluid, blood, or pus coming from the affected area.  Your finger or knuckle becomes swollen or difficult to move. Get help right away if you have:  A fever or chills.  Redness spreading away from the affected area.  Joint or muscle pain. Summary  Paronychia is an infection of the skin that surrounds a nail. It often causes pain and swelling around the nail. In some cases, a collection of pus (abscess) can form near or under the nail.  This condition may be caused by bacteria or a fungus. These germs can enter the body through an opening in the skin, such as a cut or a hangnail.  If your condition is mild, it may clear up on its own in a few days. If needed, treatment may include medicine or a procedure to drain pus from an abscess.  To prevent this condition from happening again, wear gloves if doing tasks that require your hands to get wet or to come in contact with chemicals. Also avoid injuring your nails or fingertips. This information is not intended to replace advice given to you by your health care provider. Make sure you discuss any questions you have with your health care provider. Document Released: 02/16/2001 Document Revised: 09/09/2017 Document Reviewed: 09/05/2017 Elsevier Patient Education  2020 [name].

## 2019-07-04 NOTE — Progress Notes (Signed)
Subjective:    Patient ID: [name], female    DOB: November 04, 1933, 85 y.o.   MRN: [id number]  HPI 85 y.o. WF with history of afib on coumadin, heart failure, stomach cancer, renal cancer, bladder cancer presents with swelling of left foot and for possible skin cancer on left leg.   She states she has non tender red swollen left toe, her daughter noticed it yesterday while she was cutting her toenails.   She has left tender raised nodule on her leg.   Blood pressure (!) 162/90, pulse 73, temperature (!) 97.3 F (36.3 C), weight 99 lb 3.2 oz (45 kg), SpO2 97 %.  Lab Results  Component Value Date   INR 2.1 06/29/2019   INR 1.9 (A) 06/08/2019   INR 2.1 05/11/2019    Medications  Current Outpatient Medications (Endocrine & Metabolic):  .  levothyroxine (SYNTHROID) 100 MCG tablet, TAKE 1/2 TABLET BY MOUTH DAILY BEFORE BREAKFAST.  Current Outpatient Medications (Cardiovascular):  .  losartan (COZAAR) 25 MG tablet, TAKE 2 TABLETS BY MOUTH EVERY DAY .  metoprolol tartrate (LOPRESSOR) 100 MG tablet, Take 1 tablet 2 x /day for BP .  nitroGLYCERIN (NITROSTAT) 0.4 MG SL tablet, Place 1 tablet (0.4 mg total) under the tongue every 5 (five) minutes as needed for chest pain (MAX 3 TABLETS). .  verapamil (CALAN-SR) 120 MG CR tablet, Take 1 tablet 2 x /day with a meal for BP & Heart Rhythm  Current Outpatient Medications (Respiratory):  .  cetirizine (ZYRTEC) 10 MG tablet, Take 10 mg by mouth daily.  Current Outpatient Medications (Analgesics):  .  acetaminophen (TYLENOL) 500 MG tablet, Take 500-1,000 mg by mouth every 6 (six) hours as needed for mild pain or headache.   Current Outpatient Medications (Hematological):  [name]  IRON, FERROUS SULFATE, PO, Take 1 tablet by mouth daily. [name]  warfarin (COUMADIN) 2 MG tablet, Take 1 to 2 tablets as directed to Prevent Blood Clots  Current Outpatient Medications (Other):  .  Biotin 10 MG CAPS, Take 10 mg by mouth daily.  .  Cholecalciferol (VITAMIN  D-3) 5000 units TABS, Take 5,000 Units by mouth 3 (three) times daily.  .  famotidine (PEPCID) 40 MG tablet, Take 1 tablet Daily for Indigestion & Heartburn .  gabapentin (NEURONTIN) 300 MG capsule, Take 1 capsule by mouth 2 (two) times a day. .  loperamide (IMODIUM A-D) 2 MG tablet, Take up to 12 tablets /day as needed for  for Diarrhea .  ondansetron (ZOFRAN-ODT) 8 MG disintegrating tablet, Take 8 mg by mouth every 8 (eight) hours as needed for nausea or vomiting.  .  potassium chloride (K-DUR) 10 MEQ tablet, Take 10 mEq by mouth daily. .  simethicone (MYLICON) 80 MG chewable tablet, Chew 80 mg by mouth every 6 (six) hours as needed for flatulence. .  sucralfate (CARAFATE) 1 g tablet, TAKE 1 TABLET (1 G TOTAL) BY MOUTH 4 (FOUR) TIMES DAILY - WITH MEALS AND AT BEDTIME. (Patient taking differently: Take 1 g by mouth 3 (three) times daily. )  Problem list She has Malignant neoplasm of kidney excluding renal pelvis ([place]); Hypothyroidism; Atrial fibrillation ([place]) Chadsvasc 5; Tachycardia-bradycardia syndrome ([place]); S/P repair of patent ductus arteriosus; Aortic stenosis, moderate; FHx/o Colon Cancer; Cardiac pacemaker ; Hyperlipidemia, mixed; Vitamin D deficiency; Essential hypertension; Vitamin B12 deficiency; CKD (chronic kidney disease), stage III; Adenocarcinoma of stomach ([place]); Refusal of blood transfusions as patient is Jehovah's Witness; Iron deficiency anemia due to chronic blood loss; Gastroesophageal reflux disease with  esophagitis; Post gastrectomy syndrome; S/P partial gastrectomy; No blood products; Long term current use of anticoagulant therapy; History of stroke; Malignant tumor of urinary bladder ([place]); Acute on chronic diastolic heart failure ([place]); and Abnormal glucose on their problem list.     Review of Systems See HPI    Objective:   Physical Exam Constitutional:      General: She is not in acute distress.    Appearance: She is not ill-appearing.  Cardiovascular:     Rate  and Rhythm: Normal rate. Rhythm irregular.     Heart sounds: Murmur present.  Pulmonary:     Breath sounds: Normal breath sounds.  Skin:    General: Skin is warm.     Capillary Refill: Capillary refill takes less than 2 seconds.     Findings: Erythema present.     Comments:  Left great toe with swelling and erythema, no warmth, nontender to touch, left great toenail thick, brittle and hypertrophic, good cap refill, poor sensation bilateral feet.   Neurological:     Mental Status: She is alert.               Assessment & Plan:   Skin cancer -     Ambulatory referral to Dermatology  - will refer for removal  Swelling of toe of left foot -     CBC with Differential/Platelet -     DG Toe Great Left; Future -     Sedimentation rate - no pain, good cap refill, poor sensation from neuropathy - wants to avoid ABX due to coumadin use- possible paronychia/pressure from hypertrophic nail- get labs - epson soak, elevate, avoid closed toe shoes, if any signs of infection will send in ABX - may benefit from podiatry referral  -Rule out osteomyelitis- unlikely

## 2019-07-05 LAB — CBC WITH DIFFERENTIAL/PLATELET
Absolute Monocytes: 496 cells/uL (ref 200–950)
Basophils Absolute: 37 cells/uL (ref 0–200)
Basophils Relative: 0.6 %
Eosinophils Absolute: 211 cells/uL (ref 15–500)
Eosinophils Relative: 3.4 %
HCT: 39.8 % (ref 35.0–45.0)
Hemoglobin: 13.3 g/dL (ref 11.7–15.5)
Lymphs Abs: 1376 cells/uL (ref 850–3900)
MCH: 32.8 pg (ref 27.0–33.0)
MCHC: 33.4 g/dL (ref 32.0–36.0)
MCV: 98.3 fL (ref 80.0–100.0)
MPV: 10.4 fL (ref 7.5–12.5)
Monocytes Relative: 8 %
Neutro Abs: 4080 cells/uL (ref 1500–7800)
Neutrophils Relative %: 65.8 %
Platelets: 181 10*3/uL (ref 140–400)
RBC: 4.05 10*6/uL (ref 3.80–5.10)
RDW: 12.1 % (ref 11.0–15.0)
Total Lymphocyte: 22.2 %
WBC: 6.2 10*3/uL (ref 3.8–10.8)

## 2019-07-05 LAB — SEDIMENTATION RATE: Sed Rate: 6 mm/h (ref 0–30)

## 2019-07-12 DIAGNOSIS — H02839 Dermatochalasis of unspecified eye, unspecified eyelid: Secondary | ICD-10-CM | POA: Diagnosis not present

## 2019-07-12 DIAGNOSIS — H35371 Puckering of macula, right eye: Secondary | ICD-10-CM | POA: Diagnosis not present

## 2019-07-12 DIAGNOSIS — H35033 Hypertensive retinopathy, bilateral: Secondary | ICD-10-CM | POA: Diagnosis not present

## 2019-07-12 DIAGNOSIS — H3412 Central retinal artery occlusion, left eye: Secondary | ICD-10-CM | POA: Diagnosis not present

## 2019-07-12 DIAGNOSIS — H35431 Paving stone degeneration of retina, right eye: Secondary | ICD-10-CM | POA: Diagnosis not present

## 2019-07-19 ENCOUNTER — Other Ambulatory Visit: Payer: Self-pay | Admitting: Internal Medicine

## 2019-07-20 ENCOUNTER — Ambulatory Visit (INDEPENDENT_AMBULATORY_CARE_PROVIDER_SITE_OTHER): Payer: Medicare Other | Admitting: Internal Medicine

## 2019-07-20 DIAGNOSIS — H6523 Chronic serous otitis media, bilateral: Secondary | ICD-10-CM | POA: Diagnosis not present

## 2019-07-20 DIAGNOSIS — Z8553 Personal history of malignant neoplasm of renal pelvis: Secondary | ICD-10-CM | POA: Diagnosis not present

## 2019-07-20 DIAGNOSIS — Z7901 Long term (current) use of anticoagulants: Secondary | ICD-10-CM

## 2019-07-20 DIAGNOSIS — I480 Paroxysmal atrial fibrillation: Secondary | ICD-10-CM | POA: Diagnosis not present

## 2019-07-20 DIAGNOSIS — K219 Gastro-esophageal reflux disease without esophagitis: Secondary | ICD-10-CM | POA: Diagnosis not present

## 2019-07-20 DIAGNOSIS — K3184 Gastroparesis: Secondary | ICD-10-CM | POA: Diagnosis not present

## 2019-07-20 DIAGNOSIS — I2510 Atherosclerotic heart disease of native coronary artery without angina pectoris: Secondary | ICD-10-CM | POA: Diagnosis not present

## 2019-07-20 DIAGNOSIS — I495 Sick sinus syndrome: Secondary | ICD-10-CM | POA: Diagnosis not present

## 2019-07-20 DIAGNOSIS — Z85028 Personal history of other malignant neoplasm of stomach: Secondary | ICD-10-CM | POA: Diagnosis not present

## 2019-07-20 DIAGNOSIS — I130 Hypertensive heart and chronic kidney disease with heart failure and stage 1 through stage 4 chronic kidney disease, or unspecified chronic kidney disease: Secondary | ICD-10-CM | POA: Diagnosis not present

## 2019-07-20 DIAGNOSIS — J301 Allergic rhinitis due to pollen: Secondary | ICD-10-CM | POA: Diagnosis not present

## 2019-07-20 DIAGNOSIS — I5033 Acute on chronic diastolic (congestive) heart failure: Secondary | ICD-10-CM | POA: Diagnosis not present

## 2019-07-20 DIAGNOSIS — Z950 Presence of cardiac pacemaker: Secondary | ICD-10-CM | POA: Diagnosis not present

## 2019-07-20 DIAGNOSIS — G5791 Unspecified mononeuropathy of right lower limb: Secondary | ICD-10-CM | POA: Diagnosis not present

## 2019-07-20 DIAGNOSIS — N183 Chronic kidney disease, stage 3 unspecified: Secondary | ICD-10-CM | POA: Diagnosis not present

## 2019-07-20 DIAGNOSIS — H5462 Unqualified visual loss, left eye, normal vision right eye: Secondary | ICD-10-CM | POA: Diagnosis not present

## 2019-07-20 DIAGNOSIS — I083 Combined rheumatic disorders of mitral, aortic and tricuspid valves: Secondary | ICD-10-CM | POA: Diagnosis not present

## 2019-07-20 DIAGNOSIS — E875 Hyperkalemia: Secondary | ICD-10-CM | POA: Diagnosis not present

## 2019-07-20 DIAGNOSIS — G2581 Restless legs syndrome: Secondary | ICD-10-CM | POA: Diagnosis not present

## 2019-07-20 DIAGNOSIS — I272 Pulmonary hypertension, unspecified: Secondary | ICD-10-CM | POA: Diagnosis not present

## 2019-07-20 DIAGNOSIS — Z85038 Personal history of other malignant neoplasm of large intestine: Secondary | ICD-10-CM | POA: Diagnosis not present

## 2019-07-20 DIAGNOSIS — E039 Hypothyroidism, unspecified: Secondary | ICD-10-CM | POA: Diagnosis not present

## 2019-07-20 DIAGNOSIS — E559 Vitamin D deficiency, unspecified: Secondary | ICD-10-CM | POA: Diagnosis not present

## 2019-07-20 DIAGNOSIS — Z5181 Encounter for therapeutic drug level monitoring: Secondary | ICD-10-CM | POA: Diagnosis not present

## 2019-07-20 LAB — POCT INR: INR: 1.6 — AB (ref 2.0–3.0)

## 2019-07-20 NOTE — Patient Instructions (Signed)
Description   Spoke with [place] LPN with [facility], advised pt to take 1.5 tablets today and tomorrow, then continue taking 1 tablet daily. Recheck INR in 1 week. Call us with any medication changes or concerns # [phone].

## 2019-07-27 ENCOUNTER — Ambulatory Visit (INDEPENDENT_AMBULATORY_CARE_PROVIDER_SITE_OTHER): Payer: Medicare Other | Admitting: Pharmacist

## 2019-07-27 DIAGNOSIS — Z5181 Encounter for therapeutic drug level monitoring: Secondary | ICD-10-CM | POA: Diagnosis not present

## 2019-07-27 DIAGNOSIS — I130 Hypertensive heart and chronic kidney disease with heart failure and stage 1 through stage 4 chronic kidney disease, or unspecified chronic kidney disease: Secondary | ICD-10-CM | POA: Diagnosis not present

## 2019-07-27 DIAGNOSIS — G2581 Restless legs syndrome: Secondary | ICD-10-CM | POA: Diagnosis not present

## 2019-07-27 DIAGNOSIS — Z85028 Personal history of other malignant neoplasm of stomach: Secondary | ICD-10-CM | POA: Diagnosis not present

## 2019-07-27 DIAGNOSIS — G5791 Unspecified mononeuropathy of right lower limb: Secondary | ICD-10-CM | POA: Diagnosis not present

## 2019-07-27 DIAGNOSIS — Z85038 Personal history of other malignant neoplasm of large intestine: Secondary | ICD-10-CM | POA: Diagnosis not present

## 2019-07-27 DIAGNOSIS — K219 Gastro-esophageal reflux disease without esophagitis: Secondary | ICD-10-CM | POA: Diagnosis not present

## 2019-07-27 DIAGNOSIS — Z8553 Personal history of malignant neoplasm of renal pelvis: Secondary | ICD-10-CM | POA: Diagnosis not present

## 2019-07-27 DIAGNOSIS — I2510 Atherosclerotic heart disease of native coronary artery without angina pectoris: Secondary | ICD-10-CM | POA: Diagnosis not present

## 2019-07-27 DIAGNOSIS — I480 Paroxysmal atrial fibrillation: Secondary | ICD-10-CM

## 2019-07-27 DIAGNOSIS — H6523 Chronic serous otitis media, bilateral: Secondary | ICD-10-CM | POA: Diagnosis not present

## 2019-07-27 DIAGNOSIS — Z7901 Long term (current) use of anticoagulants: Secondary | ICD-10-CM | POA: Diagnosis not present

## 2019-07-27 DIAGNOSIS — I495 Sick sinus syndrome: Secondary | ICD-10-CM | POA: Diagnosis not present

## 2019-07-27 DIAGNOSIS — E039 Hypothyroidism, unspecified: Secondary | ICD-10-CM | POA: Diagnosis not present

## 2019-07-27 DIAGNOSIS — Z950 Presence of cardiac pacemaker: Secondary | ICD-10-CM | POA: Diagnosis not present

## 2019-07-27 DIAGNOSIS — I272 Pulmonary hypertension, unspecified: Secondary | ICD-10-CM | POA: Diagnosis not present

## 2019-07-27 DIAGNOSIS — E875 Hyperkalemia: Secondary | ICD-10-CM | POA: Diagnosis not present

## 2019-07-27 DIAGNOSIS — I5033 Acute on chronic diastolic (congestive) heart failure: Secondary | ICD-10-CM | POA: Diagnosis not present

## 2019-07-27 DIAGNOSIS — I083 Combined rheumatic disorders of mitral, aortic and tricuspid valves: Secondary | ICD-10-CM | POA: Diagnosis not present

## 2019-07-27 DIAGNOSIS — H5462 Unqualified visual loss, left eye, normal vision right eye: Secondary | ICD-10-CM | POA: Diagnosis not present

## 2019-07-27 DIAGNOSIS — K3184 Gastroparesis: Secondary | ICD-10-CM | POA: Diagnosis not present

## 2019-07-27 DIAGNOSIS — N183 Chronic kidney disease, stage 3 unspecified: Secondary | ICD-10-CM | POA: Diagnosis not present

## 2019-07-27 DIAGNOSIS — E559 Vitamin D deficiency, unspecified: Secondary | ICD-10-CM | POA: Diagnosis not present

## 2019-07-27 DIAGNOSIS — J301 Allergic rhinitis due to pollen: Secondary | ICD-10-CM | POA: Diagnosis not present

## 2019-07-27 LAB — POCT INR: INR: 2.7 (ref 2.0–3.0)

## 2019-07-31 ENCOUNTER — Other Ambulatory Visit: Payer: Self-pay | Admitting: Dermatology

## 2019-07-31 DIAGNOSIS — C44729 Squamous cell carcinoma of skin of left lower limb, including hip: Secondary | ICD-10-CM | POA: Diagnosis not present

## 2019-08-01 DIAGNOSIS — Z8553 Personal history of malignant neoplasm of renal pelvis: Secondary | ICD-10-CM | POA: Diagnosis not present

## 2019-08-01 DIAGNOSIS — Z7901 Long term (current) use of anticoagulants: Secondary | ICD-10-CM | POA: Diagnosis not present

## 2019-08-01 DIAGNOSIS — I480 Paroxysmal atrial fibrillation: Secondary | ICD-10-CM | POA: Diagnosis not present

## 2019-08-01 DIAGNOSIS — H6523 Chronic serous otitis media, bilateral: Secondary | ICD-10-CM | POA: Diagnosis not present

## 2019-08-01 DIAGNOSIS — K3184 Gastroparesis: Secondary | ICD-10-CM | POA: Diagnosis not present

## 2019-08-01 DIAGNOSIS — I495 Sick sinus syndrome: Secondary | ICD-10-CM | POA: Diagnosis not present

## 2019-08-01 DIAGNOSIS — G2581 Restless legs syndrome: Secondary | ICD-10-CM | POA: Diagnosis not present

## 2019-08-01 DIAGNOSIS — E559 Vitamin D deficiency, unspecified: Secondary | ICD-10-CM | POA: Diagnosis not present

## 2019-08-01 DIAGNOSIS — Z5181 Encounter for therapeutic drug level monitoring: Secondary | ICD-10-CM | POA: Diagnosis not present

## 2019-08-01 DIAGNOSIS — Z85038 Personal history of other malignant neoplasm of large intestine: Secondary | ICD-10-CM | POA: Diagnosis not present

## 2019-08-01 DIAGNOSIS — N183 Chronic kidney disease, stage 3 unspecified: Secondary | ICD-10-CM | POA: Diagnosis not present

## 2019-08-01 DIAGNOSIS — E875 Hyperkalemia: Secondary | ICD-10-CM | POA: Diagnosis not present

## 2019-08-01 DIAGNOSIS — I5033 Acute on chronic diastolic (congestive) heart failure: Secondary | ICD-10-CM | POA: Diagnosis not present

## 2019-08-01 DIAGNOSIS — E039 Hypothyroidism, unspecified: Secondary | ICD-10-CM | POA: Diagnosis not present

## 2019-08-01 DIAGNOSIS — I2510 Atherosclerotic heart disease of native coronary artery without angina pectoris: Secondary | ICD-10-CM | POA: Diagnosis not present

## 2019-08-01 DIAGNOSIS — J301 Allergic rhinitis due to pollen: Secondary | ICD-10-CM | POA: Diagnosis not present

## 2019-08-01 DIAGNOSIS — H5462 Unqualified visual loss, left eye, normal vision right eye: Secondary | ICD-10-CM | POA: Diagnosis not present

## 2019-08-01 DIAGNOSIS — I130 Hypertensive heart and chronic kidney disease with heart failure and stage 1 through stage 4 chronic kidney disease, or unspecified chronic kidney disease: Secondary | ICD-10-CM | POA: Diagnosis not present

## 2019-08-01 DIAGNOSIS — Z85028 Personal history of other malignant neoplasm of stomach: Secondary | ICD-10-CM | POA: Diagnosis not present

## 2019-08-01 DIAGNOSIS — I272 Pulmonary hypertension, unspecified: Secondary | ICD-10-CM | POA: Diagnosis not present

## 2019-08-01 DIAGNOSIS — I083 Combined rheumatic disorders of mitral, aortic and tricuspid valves: Secondary | ICD-10-CM | POA: Diagnosis not present

## 2019-08-01 DIAGNOSIS — K219 Gastro-esophageal reflux disease without esophagitis: Secondary | ICD-10-CM | POA: Diagnosis not present

## 2019-08-01 DIAGNOSIS — G5791 Unspecified mononeuropathy of right lower limb: Secondary | ICD-10-CM | POA: Diagnosis not present

## 2019-08-01 DIAGNOSIS — Z950 Presence of cardiac pacemaker: Secondary | ICD-10-CM | POA: Diagnosis not present

## 2019-08-07 ENCOUNTER — Encounter: Payer: Self-pay | Admitting: Internal Medicine

## 2019-08-07 NOTE — Progress Notes (Signed)
History of Present Illness:      This very nice 85 y.o. WWF  presents for 6 month follow up with HTN, pAfib / Sick Sinus Syndrome,  HLD, Pre-Diabetes and Vitamin D Deficiency.       Patient also has hx/o Uterine Ca (1994),  Transitional Cell Kidney Ca (2003), Gastric Ca (2017) and  Bladder Ca (2019).      Patient is treated for HTN (1960's)  & BP has been controlled at home. Today's BP is at goal - 130/76.  In 2008, she was dx'd with pAfib and started on Coumadin  followed thru Dr Doug Sou office.  In 2011, she has a PPM implanted for SSS.  Patient had very remote repair of a patent ductus and is also followed by Dr [name] for moderate AoS and chronic CHF.  She also has ASPVD with reduced PP and no claudicationPatient has had no complaints of any cardiac type chest pain, palpitations, dyspnea / orthopnea / PND, dizziness, or dependent edema.      Hyperlipidemia is controlled with diet & meds. Patient denies myalgias or other med SE's. Last Lipids were at goal:  Lab Results  Component Value Date   CHOL 113 08/08/2019   HDL 40 (L) 08/08/2019   LDLCALC 54 08/08/2019   TRIG 100 08/08/2019   CHOLHDL 2.8 08/08/2019        Also, the patient has history of PreDiabetes / Insulin resistance since 2012 and has had no symptoms of reactive hypoglycemia, diabetic polys, paresthesias or visual blurring.  Last A1c was Normal & at goal:  Lab Results  Component Value Date   HGBA1C 5.0 08/08/2019        Further, the patient also has history of Vitamin D Deficiency ("19" / 2008)  and supplements vitamin D without any suspected side-effects. Last vitamin D was not at goal (70-100):   Lab Results  Component Value Date   VD25OH 43 08/08/2019    Current Outpatient Medications on File Prior to Visit  Medication Sig  . acetaminophen (TYLENOL) 500 MG tablet Take 500-1,000 mg by mouth every 6 (six) hours as needed for mild pain or headache.   . ALPRAZolam (XANAX) 0.5 MG tablet Take 0.5 mg by mouth  as needed for anxiety.  . Alum & Mag Hydroxide-Simeth (MYLANTA PO) Take 20 mLs by mouth daily.  . Ascorbic Acid (VITAMIN C) 1000 MG tablet Take 1,000 mg by mouth daily.  . Biotin 10 MG CAPS Take 10 mg by mouth daily.   . cetirizine (ZYRTEC) 10 MG tablet Take 10 mg by mouth daily.  . Cholecalciferol (VITAMIN D-3) 5000 units TABS Take 5,000 Units by mouth 3 (three) times daily.   . famotidine (PEPCID) 40 MG tablet Take 1 tablet Daily for Indigestion & Heartburn  . furosemide (LASIX) 20 MG tablet Take 20 mg by mouth as needed.  . gabapentin (NEURONTIN) 300 MG capsule Take 1 capsule by mouth 2 (two) times a day.  . IRON, FERROUS SULFATE, PO Take 325 mg by mouth daily.   [name] levothyroxine (SYNTHROID) 100 MCG tablet TAKE 1/2 TABLET BY MOUTH DAILY BEFORE BREAKFAST.  [name] loperamide (IMODIUM A-D) 2 MG tablet Take up to 12 tablets /day as needed for  for Diarrhea  . loratadine (CLARITIN) 10 MG tablet Take 10 mg by mouth daily.  [name] losartan (COZAAR) 25 MG tablet TAKE 2 TABLETS BY MOUTH EVERY DAY  . metoprolol tartrate (LOPRESSOR) 100 MG tablet Take 1 tablet 2 x /day for  BP  . nitroGLYCERIN (NITROSTAT) 0.4 MG SL tablet Place 1 tablet (0.4 mg total) under the tongue every 5 (five) minutes as needed for chest pain (MAX 3 TABLETS).  . ondansetron (ZOFRAN-ODT) 8 MG disintegrating tablet Take 8 mg by mouth every 8 (eight) hours as needed for nausea or vomiting.   . potassium chloride (K-DUR) 10 MEQ tablet Take 10 mEq by mouth daily.  . simethicone (MYLICON) 80 MG chewable tablet Chew 80 mg by mouth every 6 (six) hours as needed for flatulence.  . sucralfate (CARAFATE) 1 g tablet TAKE 1 TABLET (1 G TOTAL) BY MOUTH 4 (FOUR) TIMES DAILY - WITH MEALS AND AT BEDTIME. (Patient taking differently: Take 1 g by mouth 3 (three) times daily. )  . triamcinolone cream (KENALOG) 0.1 % Apply 1 application topically as needed.  . verapamil (CALAN-SR) 120 MG CR tablet Take 1 tablet 2 x /day with a meal for BP & Heart Rhythm  .  warfarin (COUMADIN) 2 MG tablet TAKE 1 TO 2 TABLETS AS DIRECTED TO PREVENT BLOOD CLOTS   No current facility-administered medications on file prior to visit.     Allergies  Allergen Reactions  . Adhesive [Tape] Other (See Comments)    Tears skin, Please use "paper" tape  . Amiodarone Other (See Comments)     Thyroid and liver and kidney problems  . Hydrocodone Other (See Comments)    HALLLUCINATIONS  . Remeron [Mirtazapine] Other (See Comments)    Hallucinations and make pt loopy  . Codeine Nausea And Vomiting  . Morphine And Related Nausea And Vomiting  . Reglan [Metoclopramide] Hives, Itching and Rash  . Requip [Ropinirole Hcl] Other (See Comments)    Headache   . Zinc Nausea Only  . Other     NO BLOOD PRODUCTS    PMHx:   Past Medical History:  Diagnosis Date  . Adenocarcinoma of stomach ([place]) 11/11/2015   gastric mass on egd  . Anal fissure   . Anemia    hx 3/17 iron infusion, on aranesp and injected B12 since 11/2015.   [name] Anxiety   . Aortic root dilatation ([place])   . Arthritis    oa  . 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 CAD (coronary artery disease)   . Chronic kidney disease    only has one kidney rt lft rem 03 ca     dr. Risa Grill, bladder cancer 05-04-2018  . Elevated LFTs    hepatic steatosis, none recent  . Gastroparesis   . GERD (gastroesophageal reflux disease)   . History of uterine cancer 1980s   treated with hysterectomy, external radiation and radiation seed implants.   [name] HTN (hypertension)   . Hyperlipemia   . Hypothyroidism   . Iron deficiency anemia due to chronic blood loss 11/24/2015  . LVH (left ventricular hypertrophy) 11/14/2015   Mild, noted on ECHO  . Moderate aortic stenosis   . Neuropathy    right leg  . No blood products 02/27/2016  . Osteomyelitis ([place]) as child  . PAF (paroxysmal atrial fibrillation) ([place])   . PONV (postoperative nausea and vomiting)    long time ago  . Presence of permanent cardiac pacemaker   . Radiation proctitis   .  Restless leg syndrome   . Stroke ([place]) 15   partial stroke left legally blind; left eye   . Tachycardia-bradycardia syndrome ([place])    s/p PPM by Dr Doreatha Lew (MDT) 07/03/10   Immunization History  Administered Date(s) Administered  . Fluad Quad(high Dose  65+) 05/11/2019  . Influenza, High Dose Seasonal PF 07/18/2015, 06/23/2017, 06/27/2018  . Influenza,inj,Quad PF,6+ Mos 05/31/2014, 06/09/2016  . Influenza-Unspecified 09/16/2009, 07/03/2013  . Pneumococcal Conjugate-13 09/06/2002  . Pneumococcal Polysaccharide-23 07/18/2015  . Td 09/06/2002  . Zoster 09/06/2008   Past Surgical History:  Procedure Laterality Date  . CARDIAC CATHETERIZATION  2008  . CHOLECYSTECTOMY  1970s  . COLONOSCOPY N/A 11/11/2015   Procedure: COLONOSCOPY;  Surgeon: [name], MD;  Location: [facility];  Service: Endoscopy;  Laterality: N/A;  . CYSTOSCOPY W/ RETROGRADES Right 05/01/2018   Procedure: CYSTOSCOPY WITH RIGHT RETROGRADE PYELOGRAM;  Surgeon: Lucas Mallow, MD;  Location: [facility];  Service: Urology;  Laterality: Right;  . CYSTOSCOPY W/ RETROGRADES Right 07/21/2018   Procedure: CYSTOSCOPY WITH RIGHT RETROGRADE PYELOGRAM;  Surgeon: Alexis Frock, MD;  Location: [facility];  Service: Urology;  Laterality: Right;  . ESOPHAGOGASTRODUODENOSCOPY N/A 11/11/2015   Procedure: ESOPHAGOGASTRODUODENOSCOPY (EGD);  Surgeon: [name], MD;  Location: [facility];  Service: Endoscopy;  Laterality: N/A;  . ESOPHAGOGASTRODUODENOSCOPY N/A 02/05/2016   Procedure: ESOPHAGOGASTRODUODENOSCOPY (EGD);  Surgeon: Ladene Artist, MD;  Location: [facility];  Service: Endoscopy;  Laterality: N/A;  . ESOPHAGOGASTRODUODENOSCOPY N/A 04/02/2016   Procedure: ESOPHAGOGASTRODUODENOSCOPY (EGD);  Surgeon: [name], MD;  Location: [facility];  Service: Endoscopy;  Laterality: N/A;  . ESOPHAGOGASTRODUODENOSCOPY (EGD) WITH PROPOFOL N/A 04/06/2017   Procedure: ESOPHAGOGASTRODUODENOSCOPY (EGD) WITH PROPOFOL;  Surgeon: [name],  MD;  [facility];  Service: Endoscopy;  Laterality: N/A;  . GASTRECTOMY N/A 01/15/2016   Procedure: OPEN PARTIAL GASTRECTOMY;  Surgeon: Stark Klein, MD;  Location: [place];  Service: General;  Laterality: N/A;  . GASTRECTOMY Left 02/24/2016   Procedure: OPEN J TUBE;  Surgeon: Stark Klein, MD;  Location: [place];  Service: General;  Laterality: Left;  [name] GASTROJEJUNOSTOMY N/A 05/18/2016   Procedure: ROUX EN Y GASTROJEJUNOSTOMY;  Surgeon: Stark Klein, MD;  Location: [place];  Service: General;  Laterality: N/A;  . I&D KNEE WITH POLY EXCHANGE Right 12/17/2013   Procedure: IRRIGATION AND DEBRIDEMEN RIGHT TOTAL  KNEE WITH POLY EXCHANGE;  Surgeon: Mauri Pole, MD;  [facility]: [facility];  Service: Orthopedics;  Laterality: Right;  . IR GENERIC HISTORICAL  06/17/2016   IR RADIOLOGIST EVAL & MGMT 06/17/2016 Ascencion Dike, PA-C [facility] INTERV RAD  . LAPAROSCOPY N/A 01/15/2016   Procedure: LAPAROSCOPY DIAGNOSTIC;  Surgeon: Stark Klein, MD;  Location: [place];  Service: General;  Laterality: N/A;  . LYSIS OF ADHESION  05/18/2016   Procedure: LYSIS OF ADHESION;  Surgeon: Stark Klein, MD;  Location: [place];  Service: General;;  . NEPHRECTOMY Left    renal cell cancer  . PACEMAKER INSERTION  07/04/2011   MDT PPM implanted by Dr Doreatha Lew for tachy/brady; managed by Dr Thompson Grayer   . PATELLAR TENDON REPAIR Right 03/06/2014   Procedure: AVULSION WITH PATELLA TENDON REPAIR;  Surgeon: Mauri Pole, MD;  [facility];  Service: Orthopedics;  Laterality: Right;  . PATENT DUCTUS ARTERIOUS REPAIR  ~ 1949  . TOTAL ABDOMINAL HYSTERECTOMY  85  . TOTAL HIP REVISION Right 2009   initial replacment surg  . TOTAL KNEE ARTHROPLASTY  02/07/2012   Procedure: TOTAL KNEE ARTHROPLASTY;  Surgeon: Mauri Pole, MD;  [facility];  Service: Orthopedics;  Laterality: Right;  . TOTAL KNEE REVISION Right 07/29/2014   Procedure: RIGHT KNEE REVISION OF PREVIOUS REPAIR EXTENSOR MECHANISM;  Surgeon: Mauri Pole, MD;   [facility];  Service: Orthopedics;  Laterality: Right;  . TRANSURETHRAL RESECTION OF BLADDER TUMOR N/A 07/21/2018   Procedure: TRANSURETHRAL RESECTION OF BLADDER TUMOR (TURBT);  Surgeon: Alexis Frock, MD;  [facility];  Service: Urology;  Laterality: N/A;  . TRANSURETHRAL RESECTION OF BLADDER TUMOR WITH MITOMYCIN-C N/A 05/01/2018   Procedure: TRANSURETHRAL RESECTION OF BLADDER TUMOR WITH POST OP INSTILLATION OF GEMCITABINE;  Surgeon: Lucas Mallow, MD;  [facility];  Service: Urology;  Laterality: N/A;  . WHIPPLE PROCEDURE N/A 05/18/2016   Procedure: EXPLORATORY LAPAROTOMY;  Surgeon: Stark Klein, MD;  Location: [place];  Service: General;  Laterality: N/A;    FHx:    Reviewed / unchanged  SHx:    Reviewed / unchanged   Systems Review:  Constitutional: Denies fever, chills, wt changes, headaches, insomnia, fatigue, night sweats, change in appetite. Eyes: Denies redness, blurred vision, diplopia, discharge, itchy, watery eyes.  ENT: Denies discharge, congestion, post nasal drip, epistaxis, sore throat, earache, hearing loss, dental pain, tinnitus, vertigo, sinus pain, snoring.  CV: Denies chest pain, palpitations, irregular heartbeat, syncope, dyspnea, diaphoresis, orthopnea, PND, claudication or edema. Respiratory: denies cough, dyspnea, DOE, pleurisy, hoarseness, laryngitis, wheezing.  Gastrointestinal: Denies dysphagia, odynophagia, heartburn, reflux, water brash, abdominal pain or cramps, nausea, vomiting, bloating, diarrhea, constipation, hematemesis, melena, hematochezia  or hemorrhoids. Genitourinary: Denies dysuria, frequency, urgency, nocturia, hesitancy, discharge, hematuria or flank pain. Musculoskeletal: Denies arthralgias, myalgias, stiffness, jt. swelling, pain, limping or strain/sprain.  Skin: Denies pruritus, rash, hives, warts, acne, eczema or change in skin lesion(s). Neuro: No weakness, tremor, incoordination, spasms, paresthesia or pain. Psychiatric:  Denies confusion, memory loss or sensory loss. Endo: Denies change in weight, skin or hair change.  Heme/Lymph: No excessive bleeding, bruising or enlarged lymph nodes.  Physical Exam  BP 130/76   Pulse 79   Temp 97.7 F (36.5 C)   Ht 4\' 11"  (1.499 m)   Wt 99 lb 12.8 oz (45.3 kg)   SpO2 97%   BMI 20.16 kg/m   Appears  well nourished, well groomed  and in no distress.  Eyes: PERRLA, EOMs, conjunctiva no swelling or erythema. Sinuses: No frontal/maxillary tenderness ENT/Mouth: EAC's clear, TM's nl w/o erythema, bulging. Nares clear w/o erythema, swelling, exudates. Oropharynx clear without erythema or exudates. Oral hygiene is good. Tongue normal, non obstructing. Hearing intact.  Neck: Supple. Thyroid not palpable. Car 2+/2+ without bruits, nodes or JVD. Chest: Respirations nl with BS clear & equal w/o rales, rhonchi, wheezing or stridor.  Cor: Heart sounds soft w/ RRR and Ao sys / dias Murmurs.  Peripheral pulses 1+/1+ with assym ankle edema to the Rt.  Abdomen: Soft & bowel sounds normal. Non-tender w/o guarding, rebound, hernias, masses or organomegaly.  Lymphatics: Unremarkable.  Musculoskeletal: Full ROM all peripheral extremities, joint stability, 5/5 strength and normal gait.  Skin: Warm, dry without exposed rashes, lesions or ecchymosis apparent.  Neuro: Cranial nerves intact, reflexes equal bilaterally. Sensory-motor testing grossly intact. Tendon reflexes grossly intact.  Pysch: Alert & oriented x 3.  Insight and judgement nl & appropriate. No ideations.  Assessment and Plan:  1. Essential hypertension  - Continue medication, monitor blood pressure at home.  - Continue DASH diet.  Reminder to go to the ER if any CP,  SOB, nausea, dizziness, severe HA, changes vision/speech.   - CBC with Diff - COMPLETE METABOLIC PANEL WITH GFR - Magnesium - TSH  2.  Hyperlipidemia, mixed  - Continue diet/meds, exercise,& lifestyle modifications.  - Continue monitor periodic  cholesterol/liver & renal functions   - Lipid  Profile - TSH  3. Abnormal glucose  - Continue diet, exercise  - Lifestyle modifications.  - Monitor appropriate labs.  - Hemoglobin A1c ([facility]) - Insulin, random  4. Vitamin D deficiency  - Continue supplementation.  - Vitamin D (25 hydroxy)  5. Hypothyroidism  - TSH  6. Medication management  - CBC with Diff - COMPLETE METABOLIC PANEL WITH GFR - Magnesium - Lipid Profile - TSH - Hemoglobin A1c ([facility]) - Insulin, random - Vitamin D (25 hydroxy)        Discussed  regular exercise, BP monitoring, weight control to achieve/maintain BMI less than 25 and discussed med and SE's. Recommended labs to assess and monitor clinical status with further disposition pending results of labs.  I discussed the assessment and treatment plan with the patient. The patient was provided an opportunity to ask questions and all were answered. The patient agreed with the plan and demonstrated an understanding of the instructions.  I provided over 30 minutes of exam, counseling, chart review and  complex critical decision making.  Kirtland Bouchard, MD

## 2019-08-07 NOTE — Patient Instructions (Signed)

## 2019-08-08 ENCOUNTER — Other Ambulatory Visit: Payer: Self-pay

## 2019-08-08 ENCOUNTER — Ambulatory Visit (INDEPENDENT_AMBULATORY_CARE_PROVIDER_SITE_OTHER): Payer: Medicare Other | Admitting: Internal Medicine

## 2019-08-08 VITALS — BP 130/76 | HR 79 | Temp 97.7°F | Ht 59.0 in | Wt 99.8 lb

## 2019-08-08 DIAGNOSIS — H53121 Transient visual loss, right eye: Secondary | ICD-10-CM

## 2019-08-08 DIAGNOSIS — E782 Mixed hyperlipidemia: Secondary | ICD-10-CM | POA: Diagnosis not present

## 2019-08-08 DIAGNOSIS — I10 Essential (primary) hypertension: Secondary | ICD-10-CM

## 2019-08-08 DIAGNOSIS — E559 Vitamin D deficiency, unspecified: Secondary | ICD-10-CM | POA: Diagnosis not present

## 2019-08-08 DIAGNOSIS — R7309 Other abnormal glucose: Secondary | ICD-10-CM

## 2019-08-08 DIAGNOSIS — I739 Peripheral vascular disease, unspecified: Secondary | ICD-10-CM

## 2019-08-08 DIAGNOSIS — Z79899 Other long term (current) drug therapy: Secondary | ICD-10-CM

## 2019-08-08 DIAGNOSIS — E039 Hypothyroidism, unspecified: Secondary | ICD-10-CM | POA: Diagnosis not present

## 2019-08-09 LAB — CBC WITH DIFFERENTIAL/PLATELET
Absolute Monocytes: 432 cells/uL (ref 200–950)
Basophils Absolute: 30 cells/uL (ref 0–200)
Basophils Relative: 0.5 %
Eosinophils Absolute: 162 cells/uL (ref 15–500)
Eosinophils Relative: 2.7 %
HCT: 41.5 % (ref 35.0–45.0)
Hemoglobin: 13.8 g/dL (ref 11.7–15.5)
Lymphs Abs: 1218 cells/uL (ref 850–3900)
MCH: 32.4 pg (ref 27.0–33.0)
MCHC: 33.3 g/dL (ref 32.0–36.0)
MCV: 97.4 fL (ref 80.0–100.0)
MPV: 10.5 fL (ref 7.5–12.5)
Monocytes Relative: 7.2 %
Neutro Abs: 4158 cells/uL (ref 1500–7800)
Neutrophils Relative %: 69.3 %
Platelets: 192 10*3/uL (ref 140–400)
RBC: 4.26 10*6/uL (ref 3.80–5.10)
RDW: 11.9 % (ref 11.0–15.0)
Total Lymphocyte: 20.3 %
WBC: 6 10*3/uL (ref 3.8–10.8)

## 2019-08-09 LAB — COMPLETE METABOLIC PANEL WITH GFR
AG Ratio: 1.6 (calc) (ref 1.0–2.5)
ALT: 17 U/L (ref 6–29)
AST: 16 U/L (ref 10–35)
Albumin: 3.7 g/dL (ref 3.6–5.1)
Alkaline phosphatase (APISO): 84 U/L (ref 37–153)
BUN/Creatinine Ratio: 16 (calc) (ref 6–22)
BUN: 17 mg/dL (ref 7–25)
CO2: 25 mmol/L (ref 20–32)
Calcium: 8.8 mg/dL (ref 8.6–10.4)
Chloride: 109 mmol/L (ref 98–110)
Creat: 1.09 mg/dL — ABNORMAL HIGH (ref 0.60–0.88)
GFR, Est African American: 54 mL/min/{1.73_m2} — ABNORMAL LOW (ref >=60)
GFR, Est Non African American: 47 mL/min/{1.73_m2} — ABNORMAL LOW (ref >=60)
Globulin: 2.3 g/dL (calc) (ref 1.9–3.7)
Glucose, Bld: 67 mg/dL (ref 65–99)
Potassium: 3.9 mmol/L (ref 3.5–5.3)
Sodium: 145 mmol/L (ref 135–146)
Total Bilirubin: 0.5 mg/dL (ref 0.2–1.2)
Total Protein: 6 g/dL — ABNORMAL LOW (ref 6.1–8.1)

## 2019-08-09 LAB — MAGNESIUM: Magnesium: 1.8 mg/dL (ref 1.5–2.5)

## 2019-08-09 LAB — INSULIN, RANDOM: Insulin: 3.5 u[IU]/mL

## 2019-08-09 LAB — TSH: TSH: 2.23 mIU/L (ref 0.40–4.50)

## 2019-08-09 LAB — HEMOGLOBIN A1C
Hgb A1c MFr Bld: 5 % of total Hgb (ref <5.7)
Mean Plasma Glucose: 97 (calc)
eAG (mmol/L): 5.4 (calc)

## 2019-08-09 LAB — VITAMIN D 25 HYDROXY (VIT D DEFICIENCY, FRACTURES): Vit D, 25-Hydroxy: 43 ng/mL (ref 30–100)

## 2019-08-09 LAB — LIPID PANEL
Cholesterol: 113 mg/dL (ref <200)
HDL: 40 mg/dL — ABNORMAL LOW (ref >=50)
LDL Cholesterol (Calc): 54 mg/dL (calc)
Non-HDL Cholesterol (Calc): 73 mg/dL (calc) (ref <130)
Total CHOL/HDL Ratio: 2.8 (calc) (ref <5.0)
Triglycerides: 100 mg/dL (ref <150)

## 2019-08-10 ENCOUNTER — Ambulatory Visit (INDEPENDENT_AMBULATORY_CARE_PROVIDER_SITE_OTHER): Payer: Medicare Other | Admitting: Cardiovascular Disease

## 2019-08-10 DIAGNOSIS — K219 Gastro-esophageal reflux disease without esophagitis: Secondary | ICD-10-CM | POA: Diagnosis not present

## 2019-08-10 DIAGNOSIS — I2510 Atherosclerotic heart disease of native coronary artery without angina pectoris: Secondary | ICD-10-CM | POA: Diagnosis not present

## 2019-08-10 DIAGNOSIS — I083 Combined rheumatic disorders of mitral, aortic and tricuspid valves: Secondary | ICD-10-CM | POA: Diagnosis not present

## 2019-08-10 DIAGNOSIS — I272 Pulmonary hypertension, unspecified: Secondary | ICD-10-CM | POA: Diagnosis not present

## 2019-08-10 DIAGNOSIS — K3184 Gastroparesis: Secondary | ICD-10-CM | POA: Diagnosis not present

## 2019-08-10 DIAGNOSIS — J301 Allergic rhinitis due to pollen: Secondary | ICD-10-CM | POA: Diagnosis not present

## 2019-08-10 DIAGNOSIS — Z5181 Encounter for therapeutic drug level monitoring: Secondary | ICD-10-CM | POA: Diagnosis not present

## 2019-08-10 DIAGNOSIS — N183 Chronic kidney disease, stage 3 unspecified: Secondary | ICD-10-CM | POA: Diagnosis not present

## 2019-08-10 DIAGNOSIS — Z85028 Personal history of other malignant neoplasm of stomach: Secondary | ICD-10-CM | POA: Diagnosis not present

## 2019-08-10 DIAGNOSIS — E559 Vitamin D deficiency, unspecified: Secondary | ICD-10-CM | POA: Diagnosis not present

## 2019-08-10 DIAGNOSIS — Z950 Presence of cardiac pacemaker: Secondary | ICD-10-CM | POA: Diagnosis not present

## 2019-08-10 DIAGNOSIS — I130 Hypertensive heart and chronic kidney disease with heart failure and stage 1 through stage 4 chronic kidney disease, or unspecified chronic kidney disease: Secondary | ICD-10-CM | POA: Diagnosis not present

## 2019-08-10 DIAGNOSIS — Z85038 Personal history of other malignant neoplasm of large intestine: Secondary | ICD-10-CM | POA: Diagnosis not present

## 2019-08-10 DIAGNOSIS — Z7901 Long term (current) use of anticoagulants: Secondary | ICD-10-CM | POA: Diagnosis not present

## 2019-08-10 DIAGNOSIS — G5791 Unspecified mononeuropathy of right lower limb: Secondary | ICD-10-CM | POA: Diagnosis not present

## 2019-08-10 DIAGNOSIS — Z8553 Personal history of malignant neoplasm of renal pelvis: Secondary | ICD-10-CM | POA: Diagnosis not present

## 2019-08-10 DIAGNOSIS — H6523 Chronic serous otitis media, bilateral: Secondary | ICD-10-CM | POA: Diagnosis not present

## 2019-08-10 DIAGNOSIS — I495 Sick sinus syndrome: Secondary | ICD-10-CM | POA: Diagnosis not present

## 2019-08-10 DIAGNOSIS — G2581 Restless legs syndrome: Secondary | ICD-10-CM | POA: Diagnosis not present

## 2019-08-10 DIAGNOSIS — H5462 Unqualified visual loss, left eye, normal vision right eye: Secondary | ICD-10-CM | POA: Diagnosis not present

## 2019-08-10 DIAGNOSIS — I5033 Acute on chronic diastolic (congestive) heart failure: Secondary | ICD-10-CM | POA: Diagnosis not present

## 2019-08-10 DIAGNOSIS — I480 Paroxysmal atrial fibrillation: Secondary | ICD-10-CM | POA: Diagnosis not present

## 2019-08-10 DIAGNOSIS — E039 Hypothyroidism, unspecified: Secondary | ICD-10-CM | POA: Diagnosis not present

## 2019-08-10 DIAGNOSIS — E875 Hyperkalemia: Secondary | ICD-10-CM | POA: Diagnosis not present

## 2019-08-10 LAB — POCT INR: INR: 2 (ref 2.0–3.0)

## 2019-08-10 NOTE — Patient Instructions (Signed)
Description   Spoke with GiGi with [facility], advised pt to continue taking 1 tablet daily. Recheck INR in 3 week. Call us with any medication changes or concerns # [phone].

## 2019-08-15 ENCOUNTER — Other Ambulatory Visit: Payer: Self-pay

## 2019-08-15 ENCOUNTER — Ambulatory Visit (HOSPITAL_COMMUNITY)
Admission: RE | Admit: 2019-08-15 | Discharge: 2019-08-15 | Disposition: A | Discharge status: Home / Self Care | Payer: Medicare Other | Source: Ambulatory Visit | Attending: Cardiology | Admitting: Cardiology

## 2019-08-15 DIAGNOSIS — H53121 Transient visual loss, right eye: Secondary | ICD-10-CM | POA: Diagnosis not present

## 2019-08-16 ENCOUNTER — Other Ambulatory Visit: Payer: Self-pay | Admitting: Adult Health

## 2019-08-16 NOTE — Progress Notes (Signed)
08/16/2019--Patient's daughter is aware of lab results and instructions. [name]

## 2019-08-17 ENCOUNTER — Telehealth: Payer: Self-pay | Admitting: *Deleted

## 2019-08-17 NOTE — Telephone Encounter (Signed)
Results of carotid arterial study faxed to Dr Oliver Barre, at patient request.

## 2019-08-23 ENCOUNTER — Other Ambulatory Visit: Payer: Self-pay | Admitting: Cardiology

## 2019-08-29 ENCOUNTER — Ambulatory Visit (INDEPENDENT_AMBULATORY_CARE_PROVIDER_SITE_OTHER): Payer: Medicare Other | Admitting: Cardiology

## 2019-08-29 DIAGNOSIS — I5033 Acute on chronic diastolic (congestive) heart failure: Secondary | ICD-10-CM | POA: Diagnosis not present

## 2019-08-29 DIAGNOSIS — G2581 Restless legs syndrome: Secondary | ICD-10-CM | POA: Diagnosis not present

## 2019-08-29 DIAGNOSIS — K3184 Gastroparesis: Secondary | ICD-10-CM | POA: Diagnosis not present

## 2019-08-29 DIAGNOSIS — E559 Vitamin D deficiency, unspecified: Secondary | ICD-10-CM | POA: Diagnosis not present

## 2019-08-29 DIAGNOSIS — Z7901 Long term (current) use of anticoagulants: Secondary | ICD-10-CM

## 2019-08-29 DIAGNOSIS — I2510 Atherosclerotic heart disease of native coronary artery without angina pectoris: Secondary | ICD-10-CM | POA: Diagnosis not present

## 2019-08-29 DIAGNOSIS — E039 Hypothyroidism, unspecified: Secondary | ICD-10-CM | POA: Diagnosis not present

## 2019-08-29 DIAGNOSIS — H6523 Chronic serous otitis media, bilateral: Secondary | ICD-10-CM | POA: Diagnosis not present

## 2019-08-29 DIAGNOSIS — N183 Chronic kidney disease, stage 3 unspecified: Secondary | ICD-10-CM | POA: Diagnosis not present

## 2019-08-29 DIAGNOSIS — I480 Paroxysmal atrial fibrillation: Secondary | ICD-10-CM | POA: Diagnosis not present

## 2019-08-29 DIAGNOSIS — G5791 Unspecified mononeuropathy of right lower limb: Secondary | ICD-10-CM | POA: Diagnosis not present

## 2019-08-29 DIAGNOSIS — J301 Allergic rhinitis due to pollen: Secondary | ICD-10-CM | POA: Diagnosis not present

## 2019-08-29 DIAGNOSIS — I495 Sick sinus syndrome: Secondary | ICD-10-CM | POA: Diagnosis not present

## 2019-08-29 DIAGNOSIS — Z950 Presence of cardiac pacemaker: Secondary | ICD-10-CM | POA: Diagnosis not present

## 2019-08-29 DIAGNOSIS — I130 Hypertensive heart and chronic kidney disease with heart failure and stage 1 through stage 4 chronic kidney disease, or unspecified chronic kidney disease: Secondary | ICD-10-CM | POA: Diagnosis not present

## 2019-08-29 DIAGNOSIS — Z5181 Encounter for therapeutic drug level monitoring: Secondary | ICD-10-CM | POA: Diagnosis not present

## 2019-08-29 DIAGNOSIS — K219 Gastro-esophageal reflux disease without esophagitis: Secondary | ICD-10-CM | POA: Diagnosis not present

## 2019-08-29 DIAGNOSIS — Z85028 Personal history of other malignant neoplasm of stomach: Secondary | ICD-10-CM | POA: Diagnosis not present

## 2019-08-29 DIAGNOSIS — I083 Combined rheumatic disorders of mitral, aortic and tricuspid valves: Secondary | ICD-10-CM | POA: Diagnosis not present

## 2019-08-29 DIAGNOSIS — E875 Hyperkalemia: Secondary | ICD-10-CM | POA: Diagnosis not present

## 2019-08-29 DIAGNOSIS — H5462 Unqualified visual loss, left eye, normal vision right eye: Secondary | ICD-10-CM | POA: Diagnosis not present

## 2019-08-29 DIAGNOSIS — Z8553 Personal history of malignant neoplasm of renal pelvis: Secondary | ICD-10-CM | POA: Diagnosis not present

## 2019-08-29 DIAGNOSIS — I272 Pulmonary hypertension, unspecified: Secondary | ICD-10-CM | POA: Diagnosis not present

## 2019-08-29 DIAGNOSIS — Z85038 Personal history of other malignant neoplasm of large intestine: Secondary | ICD-10-CM | POA: Diagnosis not present

## 2019-08-29 LAB — POCT INR: INR: 2 (ref 2.0–3.0)

## 2019-08-29 NOTE — Patient Instructions (Signed)
Description   Spoke with GiGi with [facility] [phone]) advised pt to continue taking 1 tablet daily. Recheck INR in 4 week. Call us with any medication changes or concerns # [phone].

## 2019-08-30 ENCOUNTER — Ambulatory Visit (INDEPENDENT_AMBULATORY_CARE_PROVIDER_SITE_OTHER): Payer: Medicare Other | Admitting: *Deleted

## 2019-08-30 DIAGNOSIS — Z950 Presence of cardiac pacemaker: Secondary | ICD-10-CM | POA: Diagnosis not present

## 2019-08-30 DIAGNOSIS — C672 Malignant neoplasm of lateral wall of bladder: Secondary | ICD-10-CM | POA: Diagnosis not present

## 2019-08-31 LAB — CUP PACEART REMOTE DEVICE CHECK
Battery Impedance: 2102 Ohm
Battery Remaining Longevity: 33 mo
Battery Voltage: 2.77 V
Brady Statistic AP VP Percent: 0 %
Brady Statistic AP VS Percent: 63 %
Brady Statistic AS VP Percent: 0 %
Brady Statistic AS VS Percent: 37 %
Date Time Interrogation Session: 20201224122550
Implantable Lead Implant Date: 20111027
Implantable Lead Implant Date: 20111027
Implantable Lead Location: 753859
Implantable Lead Location: 753860
Implantable Lead Model: 4469
Implantable Lead Model: 4470
Implantable Lead Serial Number: 548229
Implantable Lead Serial Number: 686026
Implantable Pulse Generator Implant Date: 20111027
Lead Channel Impedance Value: 389 Ohm
Lead Channel Impedance Value: 472 Ohm
Lead Channel Pacing Threshold Amplitude: 0.375 V
Lead Channel Pacing Threshold Amplitude: 2 V
Lead Channel Pacing Threshold Pulse Width: 0.4 ms
Lead Channel Pacing Threshold Pulse Width: 0.4 ms
Lead Channel Setting Pacing Amplitude: 2 V
Lead Channel Setting Pacing Amplitude: 4 V
Lead Channel Setting Pacing Pulse Width: 0.4 ms
Lead Channel Setting Sensing Sensitivity: 2 mV

## 2019-09-04 NOTE — Progress Notes (Signed)
Cardiology Office Note   Date:  09/05/2019   ID:  SAYWARD FOUTZ, DOB [birth year], MRN [id number]  PCP:  [name], MD  Cardiologist:  [name], MD EP: Thompson Grayer, MD  Chief Complaint  Patient presents with  . Follow-up    HTN and CHF      History of Present Illness: [name] is a 85 y.o. 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   She was last evaluated by cardiology at an outpatient visit with Doreene Adas, PA-C 03/02/2019 in follow-up of a recent ER visit for a fall, at which time she was reported to be at her dry weight with no LE edema after recent volume overload complaints managed with increased lasix. She had no cardiac complaints, though did not some MSK discomfort following the fall. Her lasix was stopped at that visit and her losartan increased to 50mg  daily for management of HTN. She was recommended to follow-up in 6 months.   She presents today for follow-up of her CHF and HTN. Her daughter accompanies her today. She feels she has been doing well from a cardiac standpoint since her last visit. She monitors her weight daily and reports needing to take lasix once or twice a month for weight gain. She has not had any recurrent falls since 02/2019, though reports she felt that fall was related to a syncopal event. No complaints of chest pain, SOB, LE edema, dizziness, lightheadedness, syncope, orthopnea, or PND.    Past Medical History:  Diagnosis Date  . Adenocarcinoma of stomach ([place]) 11/11/2015   gastric mass on egd  . Anal fissure   . Anemia    hx 3/17 iron infusion, on aranesp and injected B12 since 11/2015.   [name] Anxiety   . Aortic root dilatation ([place])   . Arthritis    oa  . 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 CAD (coronary artery disease)   . Chronic kidney disease    only has one kidney rt lft rem 03 ca     dr. Risa Grill, bladder cancer 05-04-2018  . Elevated LFTs    hepatic steatosis, none recent  . Gastroparesis   . GERD (gastroesophageal reflux disease)   . History of uterine cancer 1980s   treated with hysterectomy, external radiation and radiation seed implants.   [name] HTN (hypertension)   . Hyperlipemia   . Hypothyroidism   . Iron deficiency anemia due to chronic blood loss 11/24/2015  . LVH (left ventricular hypertrophy) 11/14/2015   Mild, noted on ECHO  . Moderate aortic stenosis   . Neuropathy    right leg  . No blood products 02/27/2016  . Osteomyelitis ([place]) as child  . PAF (paroxysmal atrial fibrillation) ([place])   . PONV (postoperative nausea and vomiting)    long time ago  . Presence of permanent cardiac pacemaker   . Radiation proctitis   . Restless leg syndrome   . Stroke ([place]) 15   partial stroke left legally blind; left eye   . Tachycardia-bradycardia syndrome ([place])    s/p PPM by Dr Doreatha Lew (MDT) 07/03/10    Past Surgical History:  Procedure Laterality Date  . CARDIAC CATHETERIZATION  2008  . CHOLECYSTECTOMY  1970s  . COLONOSCOPY N/A 11/11/2015   Procedure: COLONOSCOPY;  Surgeon: [name], MD;  Location: [facility];  Service: Endoscopy;  Laterality: N/A;  . CYSTOSCOPY W/ RETROGRADES Right 05/01/2018   Procedure: CYSTOSCOPY WITH RIGHT RETROGRADE PYELOGRAM;  Surgeon: Lucas Mallow, MD;  Location: [facility];  Service: Urology;  Laterality: Right;  . CYSTOSCOPY W/ RETROGRADES Right 07/21/2018   Procedure: CYSTOSCOPY WITH RIGHT RETROGRADE PYELOGRAM;  Surgeon: Alexis Frock, MD;  Location: [facility];  Service: Urology;  Laterality: Right;  . ESOPHAGOGASTRODUODENOSCOPY N/A 11/11/2015   Procedure: ESOPHAGOGASTRODUODENOSCOPY (EGD);  Surgeon: [name], MD;  Location: [facility];  Service: Endoscopy;  Laterality: N/A;  . ESOPHAGOGASTRODUODENOSCOPY  N/A 02/05/2016   Procedure: ESOPHAGOGASTRODUODENOSCOPY (EGD);  Surgeon: Ladene Artist, MD;  Location: [facility];  Service: Endoscopy;  Laterality: N/A;  . ESOPHAGOGASTRODUODENOSCOPY N/A 04/02/2016   Procedure: ESOPHAGOGASTRODUODENOSCOPY (EGD);  Surgeon: [name], MD;  Location: [facility];  Service: Endoscopy;  Laterality: N/A;  . ESOPHAGOGASTRODUODENOSCOPY (EGD) WITH PROPOFOL N/A 04/06/2017   Procedure: ESOPHAGOGASTRODUODENOSCOPY (EGD) WITH PROPOFOL;  Surgeon: [name], MD;  [facility];  Service: Endoscopy;  Laterality: N/A;  . GASTRECTOMY N/A 01/15/2016   Procedure: OPEN PARTIAL GASTRECTOMY;  Surgeon: Stark Klein, MD;  Location: [place];  Service: General;  Laterality: N/A;  . GASTRECTOMY Left 02/24/2016   Procedure: OPEN J TUBE;  Surgeon: Stark Klein, MD;  Location: [place];  Service: General;  Laterality: Left;  [name] GASTROJEJUNOSTOMY N/A 05/18/2016   Procedure: ROUX EN [place];  Surgeon: Stark Klein, MD;  Location: [place];  Service: General;  Laterality: N/A;  . I & D KNEE WITH POLY EXCHANGE Right 12/17/2013   Procedure: IRRIGATION AND DEBRIDEMEN RIGHT TOTAL  KNEE WITH POLY EXCHANGE;  Surgeon: Mauri Pole, MD;  [facility];  Service: Orthopedics;  Laterality: Right;  . IR GENERIC HISTORICAL  06/17/2016   IR RADIOLOGIST EVAL & MGMT 06/17/2016 Ascencion Dike, PA-C [facility] INTERV RAD  . LAPAROSCOPY N/A 01/15/2016   Procedure: LAPAROSCOPY DIAGNOSTIC;  Surgeon: Stark Klein, MD;  Location: [place];  Service: General;  Laterality: N/A;  . LYSIS OF ADHESION  05/18/2016   Procedure: LYSIS OF ADHESION;  Surgeon: Stark Klein, MD;  Location: [place];  Service: General;;  . NEPHRECTOMY Left    renal cell cancer  . PACEMAKER INSERTION  07/04/2011   MDT PPM implanted by Dr Doreatha Lew for tachy/brady; managed by Dr Thompson Grayer   . PATELLAR TENDON REPAIR Right 03/06/2014   Procedure: AVULSION WITH PATELLA TENDON REPAIR;  Surgeon: Mauri Pole, MD;  [facility];   Service: Orthopedics;  Laterality: Right;  . PATENT DUCTUS ARTERIOUS REPAIR  ~ 1949  . TOTAL ABDOMINAL HYSTERECTOMY  85  . TOTAL HIP REVISION Right 2009   initial replacment surg  . TOTAL KNEE ARTHROPLASTY  02/07/2012   Procedure: TOTAL KNEE ARTHROPLASTY;  Surgeon: Mauri Pole, MD;  [facility];  Service: Orthopedics;  Laterality: Right;  . TOTAL KNEE REVISION Right 07/29/2014   Procedure: RIGHT KNEE REVISION OF PREVIOUS REPAIR EXTENSOR MECHANISM;  Surgeon: Mauri Pole, MD;  [facility];  Service: Orthopedics;  Laterality: Right;  . TRANSURETHRAL RESECTION OF BLADDER TUMOR N/A 07/21/2018   Procedure: TRANSURETHRAL RESECTION OF BLADDER TUMOR (TURBT);  Surgeon: Alexis Frock, MD;  [facility];  Service: Urology;  Laterality: N/A;  . TRANSURETHRAL RESECTION OF BLADDER TUMOR WITH MITOMYCIN-C N/A 05/01/2018   Procedure: TRANSURETHRAL RESECTION OF BLADDER TUMOR WITH POST OP INSTILLATION OF GEMCITABINE;  Surgeon: Lucas Mallow, MD;  Location: [facility];  Service: Urology;  Laterality: N/A;  . WHIPPLE PROCEDURE N/A 05/18/2016   Procedure: EXPLORATORY LAPAROTOMY;  Surgeon: Stark Klein, MD;  Location: [place] [facility];  Service: General;  Laterality: N/A;     Current Outpatient Medications  Medication Sig Dispense Refill  . acetaminophen (TYLENOL) 500 MG tablet Take 500-1,000 mg by mouth every 6 (six) hours as needed for mild pain or headache.     . ALPRAZolam (XANAX) 0.5 MG tablet Take 0.5 mg by mouth as needed for anxiety.    . Alum & Mag Hydroxide-Simeth (MYLANTA PO) Take 20 mLs by mouth daily.    . Ascorbic Acid (VITAMIN C) 1000 MG tablet Take 1,000 mg by mouth daily.    . Biotin 10 MG CAPS Take 10 mg by mouth daily.     . cetirizine (ZYRTEC) 10 MG tablet Take 10 mg by mouth daily.    . Cholecalciferol (VITAMIN D-3) 5000 units TABS Take 5,000 Units by mouth 3 (three) times daily.     . famotidine (PEPCID) 40 MG tablet Take 1 tablet Daily for Indigestion & Heartburn 90 tablet 3  .  furosemide (LASIX) 20 MG tablet Take 20 mg by mouth as needed.    . gabapentin (NEURONTIN) 300 MG capsule Take 1 capsule by mouth 2 (two) times a day.    . IRON, FERROUS SULFATE, PO Take 325 mg by mouth daily.     [name] levothyroxine (SYNTHROID) 100 MCG tablet Take 1/2 tablet daily for thyroid. 45 tablet 1  . loperamide (IMODIUM A-D) 2 MG tablet Take up to 12 tablets /day as needed for  for Diarrhea 120 tablet 11  . loratadine (CLARITIN) 10 MG tablet Take 10 mg by mouth daily.    [name] losartan (COZAAR) 25 MG tablet Take 2 tablets (50 mg total) by mouth daily. DUE FOR OFFICE VISIT 60 tablet 0  . metoprolol tartrate (LOPRESSOR) 100 MG tablet Take 1 tablet 2 x /day for BP 30 tablet 6  . nitroGLYCERIN (NITROSTAT) 0.4 MG SL tablet Place 1 tablet (0.4 mg total) under the tongue every 5 (five) minutes as needed for chest pain (MAX 3 TABLETS). 25 tablet prn  . ondansetron (ZOFRAN-ODT) 8 MG disintegrating tablet Take 8 mg by mouth every 8 (eight) hours as needed for nausea or vomiting.     . potassium chloride (K-DUR) 10 MEQ tablet Take 10 mEq by mouth daily.    . simethicone (MYLICON) 80 MG chewable tablet Chew 80 mg by mouth every 6 (six) hours as needed for flatulence.    . sucralfate (CARAFATE) 1 g tablet TAKE 1 TABLET (1 G TOTAL) BY MOUTH 4 (FOUR) TIMES DAILY - WITH MEALS AND AT BEDTIME. (Patient taking differently: Take 1 g by mouth 3 (three) times daily. ) 360 tablet 3  . triamcinolone cream (KENALOG) 0.1 % Apply 1 application topically as needed.    . verapamil (CALAN-SR) 120 MG CR tablet Take 1 tablet 2 x /day with a meal for BP & Heart Rhythm 180 tablet 3  . warfarin (COUMADIN) 2 MG tablet TAKE 1 TO 2 TABLETS AS DIRECTED TO PREVENT BLOOD CLOTS 180 tablet 1   No current facility-administered medications for this visit.    Allergies:   Adhesive [tape], Amiodarone, Hydrocodone, Remeron [mirtazapine], Codeine, Morphine and related, Reglan [metoclopramide], Requip [ropinirole hcl], Zinc, and Other     Social History:  The patient  reports that she has never smoked. She has never used smokeless tobacco. She reports that she does not drink alcohol or  use drugs.   Family History:  The patient's family history includes Colon cancer (age of onset: 109) in her mother; Heart attack in her father; Heart disease in her father, maternal grandmother, and mother.    ROS:  Please see the history of present illness.   Otherwise, review of systems are positive for none.   All other systems are reviewed and negative.    PHYSICAL EXAM: VS:  BP (!) 174/84   Pulse 68   Temp 97.9 F (36.6 C)   Ht 4\' 11"  (1.499 m)   Wt 99 lb 9.6 oz (45.2 kg)   SpO2 98%   BMI 20.12 kg/m  , BMI Body mass index is 20.12 kg/m. GEN: Petite elderly lady sitting in exam room chair in no acute distress HEENT: sclera anicteric Neck: no JVD, carotid bruits, or masses Cardiac: RRR; +murmur, no rubs, or gallops, trace LE edema  Respiratory:  clear to auscultation bilaterally, normal work of breathing GI: soft, nontender, nondistended, + BS MS: no deformity or atrophy Skin: warm and dry, no rash Neuro:  Strength and sensation are intact Psych: euthymic mood, full affect   EKG:  EKG is not ordered today.    Recent Labs: 03/02/2019: NT-Pro BNP 10,870 08/08/2019: ALT 17; BUN 17; Creat 1.09; Hemoglobin 13.8; Magnesium 1.8; Platelets 192; Potassium 3.9; Sodium 145; TSH 2.23    Lipid Panel    Component Value Date/Time   CHOL 113 08/08/2019 1030   TRIG 100 08/08/2019 1030   HDL 40 (L) 08/08/2019 1030   CHOLHDL 2.8 08/08/2019 1030   VLDL 29 03/23/2017 1445   LDLCALC 54 08/08/2019 1030      Wt Readings from Last 3 Encounters:  09/05/19 99 lb 9.6 oz (45.2 kg)  08/08/19 99 lb 12.8 oz (45.3 kg)  07/04/19 99 lb 3.2 oz (45 kg)      Other studies Reviewed: Additional studies/ records that were reviewed today include:   Echocardiogram 2017: Study Conclusions  - Left ventricle: The cavity size was normal. There  was mild   concentric hypertrophy. Systolic function was normal. The   estimated ejection fraction was in the range of 60% to 65%. Wall   motion was normal; there were no regional wall motion   abnormalities. The study was not technically sufficient to allow   evaluation of LV diastolic dysfunction due to atrial   fibrillation. - Aortic valve: Valve mobility was restricted. There was mild to   moderate stenosis. There was moderate regurgitation. Peak   gradient (S): 38 mm Hg. Valve area (VTI): 0.55 cm^2. Valve area   (Vmax): 0.57 cm^2. Valve area (Vmean): 0.59 cm^2. - Aortic root: The aortic root was normal in size. - Ascending aorta: The ascending aorta was normal in size. - Mitral valve: Calcified annulus. Moderately thickened, severely   calcified leaflets . Mobility was restricted. The findings are   consistent with moderate stenosis. There was mild regurgitation.   Valve area by continuity equation (using LVOT flow): 0.82 cm^2. - Left atrium: The atrium was mildly dilated. - Right ventricle: The cavity size was mildly dilated. Wall   thickness was normal. Systolic function was mildly reduced. - Right atrium: The atrium was mildly dilated. - Tricuspid valve: There was moderate regurgitation. - Pulmonary arteries: Systolic pressure was moderately to severely   increased. PA peak pressure: 55 mm Hg (S). - Inferior vena cava: The vessel was dilated. The respirophasic   diameter changes were blunted (< 50%), consistent with elevated   central venous pressure. -  Pericardium, extracardiac: There was no pericardial effusion.  NST 2017:   The left ventricular ejection fraction is normal (55-65%).  Nuclear stress EF: 62%.  The study is normal.   Normal stress nuclear study with no ischemia or infarction; EF 62 with normal wall motion; note patient in atrial flutter at time of study.   ASSESSMENT AND PLAN:  1. Chronic diastolic CHF: appears euvolemic on exam. She monitors her  weight daily and doses prn lasix appropriately.  - Continue metoprolol and prn lasix - Continue to monitor weight daily and limit salt intake.   2. HTN: BP 174/84 today; BP typically in the 120s-130s/70-80s at home.  - Continue metoprolol, losartan, and verapamil   3. Paroxysmal atrial fibrillation: follows with the coumadin clinic with INR 2.0 08/29/2019. AF burden is 10.1% according to PPM interrogation 08/30/2019.  - Continue coumadin for stroke ppx - Continue metoprolol and verapamil for rate control  4. Aortic valvulopathy: mild-moderate AS and moderate AI on echo 2017. No complaints of chest pain. ?syncpal event 02/2019, none since that time.  - Will update echo at this time.  5. Tachy-brady syndrome s/p PPM: device interrogation 08/30/2019 with normal function; AF burden is 10.1%.  - Continue routine PPM monitoring per Dr. Rayann Heman  6. Carotid artery disease: mild stenosis noted on carotid dopplers 08/15/2019 - Recommend annual follow-up doppler studies   Current medicines are reviewed at length with the patient today.  The patient does not have concerns regarding medicines.  The following changes have been made:  no change  Labs/ tests ordered today include:   Orders Placed This Encounter  Procedures  . ECHOCARDIOGRAM COMPLETE     Disposition:   FU with Dr. [name] in 1 year  Signed, Abigail Butts, PA-C  09/05/2019 3:18 PM

## 2019-09-05 ENCOUNTER — Other Ambulatory Visit: Payer: Self-pay

## 2019-09-05 ENCOUNTER — Encounter: Payer: Self-pay | Admitting: Medical

## 2019-09-05 ENCOUNTER — Ambulatory Visit: Payer: Medicare Other | Admitting: Medical

## 2019-09-05 VITALS — BP 174/84 | HR 68 | Temp 97.9°F | Ht 59.0 in | Wt 99.6 lb

## 2019-09-05 DIAGNOSIS — I480 Paroxysmal atrial fibrillation: Secondary | ICD-10-CM | POA: Diagnosis not present

## 2019-09-05 DIAGNOSIS — I350 Nonrheumatic aortic (valve) stenosis: Secondary | ICD-10-CM | POA: Diagnosis not present

## 2019-09-05 DIAGNOSIS — Z7901 Long term (current) use of anticoagulants: Secondary | ICD-10-CM | POA: Diagnosis not present

## 2019-09-05 DIAGNOSIS — I495 Sick sinus syndrome: Secondary | ICD-10-CM

## 2019-09-05 DIAGNOSIS — I10 Essential (primary) hypertension: Secondary | ICD-10-CM

## 2019-09-05 DIAGNOSIS — I5032 Chronic diastolic (congestive) heart failure: Secondary | ICD-10-CM

## 2019-09-05 MED ORDER — LOSARTAN POTASSIUM 25 MG PO TABS: 50.0000 mg | ORAL_TABLET | Freq: Every day | ORAL | 0 refills | Status: DC

## 2019-09-05 NOTE — Patient Instructions (Signed)
Medication Instructions: No changes Medication Losartan has been refilled and sent to your pharmacy.  *If you need a refill on your cardiac medications before your next appointment, please call your pharmacy*  Lab Work:none  Testing/Procedures: Your physician has requested that you have an echocardiogram. Echocardiography is a painless test that uses sound waves to create images of your heart. It provides your doctor with information about the size and shape of your heart and how well your heart's chambers and valves are working. This procedure takes approximately one hour. There are no restrictions for this procedure. Will be performed at [address]   Follow-Up: At [facility], you and your health needs are our priority.  As part of our continuing mission to provide you with exceptional heart care, we have created designated Provider Care Teams.  These Care Teams include your primary Cardiologist (physician) and Advanced Practice Providers (APPs -  Physician Assistants and Nurse Practitioners) who all work together to provide you with the care you need, when you need it.  Your next appointment:   1 year(s)  The format for your next appointment:   Either In Person or Virtual  Provider:   [name], MD  Other Instructions

## 2019-09-10 ENCOUNTER — Telehealth: Payer: Self-pay | Admitting: Cardiology

## 2019-09-10 DIAGNOSIS — Z950 Presence of cardiac pacemaker: Secondary | ICD-10-CM | POA: Diagnosis not present

## 2019-09-10 DIAGNOSIS — E039 Hypothyroidism, unspecified: Secondary | ICD-10-CM | POA: Diagnosis not present

## 2019-09-10 DIAGNOSIS — I272 Pulmonary hypertension, unspecified: Secondary | ICD-10-CM | POA: Diagnosis not present

## 2019-09-10 DIAGNOSIS — Z85038 Personal history of other malignant neoplasm of large intestine: Secondary | ICD-10-CM | POA: Diagnosis not present

## 2019-09-10 DIAGNOSIS — I130 Hypertensive heart and chronic kidney disease with heart failure and stage 1 through stage 4 chronic kidney disease, or unspecified chronic kidney disease: Secondary | ICD-10-CM | POA: Diagnosis not present

## 2019-09-10 DIAGNOSIS — G5791 Unspecified mononeuropathy of right lower limb: Secondary | ICD-10-CM | POA: Diagnosis not present

## 2019-09-10 DIAGNOSIS — G2581 Restless legs syndrome: Secondary | ICD-10-CM | POA: Diagnosis not present

## 2019-09-10 DIAGNOSIS — H6523 Chronic serous otitis media, bilateral: Secondary | ICD-10-CM | POA: Diagnosis not present

## 2019-09-10 DIAGNOSIS — I2510 Atherosclerotic heart disease of native coronary artery without angina pectoris: Secondary | ICD-10-CM | POA: Diagnosis not present

## 2019-09-10 DIAGNOSIS — I480 Paroxysmal atrial fibrillation: Secondary | ICD-10-CM | POA: Diagnosis not present

## 2019-09-10 DIAGNOSIS — I083 Combined rheumatic disorders of mitral, aortic and tricuspid valves: Secondary | ICD-10-CM | POA: Diagnosis not present

## 2019-09-10 DIAGNOSIS — K219 Gastro-esophageal reflux disease without esophagitis: Secondary | ICD-10-CM | POA: Diagnosis not present

## 2019-09-10 DIAGNOSIS — J301 Allergic rhinitis due to pollen: Secondary | ICD-10-CM | POA: Diagnosis not present

## 2019-09-10 DIAGNOSIS — Z8553 Personal history of malignant neoplasm of renal pelvis: Secondary | ICD-10-CM | POA: Diagnosis not present

## 2019-09-10 DIAGNOSIS — N183 Chronic kidney disease, stage 3 unspecified: Secondary | ICD-10-CM | POA: Diagnosis not present

## 2019-09-10 DIAGNOSIS — I495 Sick sinus syndrome: Secondary | ICD-10-CM | POA: Diagnosis not present

## 2019-09-10 DIAGNOSIS — Z5181 Encounter for therapeutic drug level monitoring: Secondary | ICD-10-CM | POA: Diagnosis not present

## 2019-09-10 DIAGNOSIS — K3184 Gastroparesis: Secondary | ICD-10-CM | POA: Diagnosis not present

## 2019-09-10 DIAGNOSIS — H5462 Unqualified visual loss, left eye, normal vision right eye: Secondary | ICD-10-CM | POA: Diagnosis not present

## 2019-09-10 DIAGNOSIS — E875 Hyperkalemia: Secondary | ICD-10-CM | POA: Diagnosis not present

## 2019-09-10 DIAGNOSIS — E559 Vitamin D deficiency, unspecified: Secondary | ICD-10-CM | POA: Diagnosis not present

## 2019-09-10 DIAGNOSIS — I5033 Acute on chronic diastolic (congestive) heart failure: Secondary | ICD-10-CM | POA: Diagnosis not present

## 2019-09-10 DIAGNOSIS — Z7901 Long term (current) use of anticoagulants: Secondary | ICD-10-CM | POA: Diagnosis not present

## 2019-09-10 DIAGNOSIS — Z85028 Personal history of other malignant neoplasm of stomach: Secondary | ICD-10-CM | POA: Diagnosis not present

## 2019-09-10 NOTE — Telephone Encounter (Signed)
Spoke with pt's daughter and pt thinks has been in and out of afib since 09/05/19 after appt with Roby Lofts PA  Pt c/o SOB ,fatigue , and some weight gain ( 3 lbs ) Per daughter pt has taken Furosemide 20 mg for the last couple of days and weight has went up today by a few ounces  Pt's B/p today  Is 120/78 and HR 71  Will  Continue to monitor and will forward message to Dr [name] for review and recommendations ./cy

## 2019-09-10 NOTE — Telephone Encounter (Signed)
New message   Pt c/o swelling: STAT is pt has developed SOB within 24 hours  1) How much weight have you gained and in what time span? 3lbs from 08/12/19  2) If swelling, where is the swelling located? Around ankles   3) Are you currently taking a fluid pill?yes   4) Are you currently SOB? Yes   5) Do you have a log of your daily weights (if so, list)? yes  6) Have you gained 3 pounds in a day or 5 pounds in a week? Yes   7) Have you traveled recently?n/a

## 2019-09-11 DIAGNOSIS — J301 Allergic rhinitis due to pollen: Secondary | ICD-10-CM | POA: Diagnosis not present

## 2019-09-11 DIAGNOSIS — E559 Vitamin D deficiency, unspecified: Secondary | ICD-10-CM | POA: Diagnosis not present

## 2019-09-11 DIAGNOSIS — Z7901 Long term (current) use of anticoagulants: Secondary | ICD-10-CM | POA: Diagnosis not present

## 2019-09-11 DIAGNOSIS — H5462 Unqualified visual loss, left eye, normal vision right eye: Secondary | ICD-10-CM | POA: Diagnosis not present

## 2019-09-11 DIAGNOSIS — Z5181 Encounter for therapeutic drug level monitoring: Secondary | ICD-10-CM | POA: Diagnosis not present

## 2019-09-11 DIAGNOSIS — G2581 Restless legs syndrome: Secondary | ICD-10-CM | POA: Diagnosis not present

## 2019-09-11 DIAGNOSIS — I272 Pulmonary hypertension, unspecified: Secondary | ICD-10-CM | POA: Diagnosis not present

## 2019-09-11 DIAGNOSIS — H6523 Chronic serous otitis media, bilateral: Secondary | ICD-10-CM | POA: Diagnosis not present

## 2019-09-11 DIAGNOSIS — G5791 Unspecified mononeuropathy of right lower limb: Secondary | ICD-10-CM | POA: Diagnosis not present

## 2019-09-11 DIAGNOSIS — K3184 Gastroparesis: Secondary | ICD-10-CM | POA: Diagnosis not present

## 2019-09-11 DIAGNOSIS — I083 Combined rheumatic disorders of mitral, aortic and tricuspid valves: Secondary | ICD-10-CM | POA: Diagnosis not present

## 2019-09-11 DIAGNOSIS — Z8553 Personal history of malignant neoplasm of renal pelvis: Secondary | ICD-10-CM | POA: Diagnosis not present

## 2019-09-11 DIAGNOSIS — E039 Hypothyroidism, unspecified: Secondary | ICD-10-CM | POA: Diagnosis not present

## 2019-09-11 DIAGNOSIS — E875 Hyperkalemia: Secondary | ICD-10-CM | POA: Diagnosis not present

## 2019-09-11 DIAGNOSIS — K219 Gastro-esophageal reflux disease without esophagitis: Secondary | ICD-10-CM | POA: Diagnosis not present

## 2019-09-11 DIAGNOSIS — Z85038 Personal history of other malignant neoplasm of large intestine: Secondary | ICD-10-CM | POA: Diagnosis not present

## 2019-09-11 DIAGNOSIS — N183 Chronic kidney disease, stage 3 unspecified: Secondary | ICD-10-CM | POA: Diagnosis not present

## 2019-09-11 DIAGNOSIS — Z85028 Personal history of other malignant neoplasm of stomach: Secondary | ICD-10-CM | POA: Diagnosis not present

## 2019-09-11 DIAGNOSIS — I130 Hypertensive heart and chronic kidney disease with heart failure and stage 1 through stage 4 chronic kidney disease, or unspecified chronic kidney disease: Secondary | ICD-10-CM | POA: Diagnosis not present

## 2019-09-11 DIAGNOSIS — Z950 Presence of cardiac pacemaker: Secondary | ICD-10-CM | POA: Diagnosis not present

## 2019-09-11 DIAGNOSIS — I2510 Atherosclerotic heart disease of native coronary artery without angina pectoris: Secondary | ICD-10-CM | POA: Diagnosis not present

## 2019-09-11 DIAGNOSIS — I480 Paroxysmal atrial fibrillation: Secondary | ICD-10-CM | POA: Diagnosis not present

## 2019-09-11 DIAGNOSIS — I495 Sick sinus syndrome: Secondary | ICD-10-CM | POA: Diagnosis not present

## 2019-09-11 DIAGNOSIS — I5033 Acute on chronic diastolic (congestive) heart failure: Secondary | ICD-10-CM | POA: Diagnosis not present

## 2019-09-11 NOTE — Telephone Encounter (Signed)
It is possible she is in Afib. Her last device interrogation showed she was in AFib 10% of the time. We can have her device checked again to see but her HR is well controlled and I don't think this will change her therapy. Continue to use lasix as needed for weight gain and swelling.  Florentine Diekman [name] MD, [facility]

## 2019-09-13 NOTE — Telephone Encounter (Addendum)
Received remote transmission. Patient's presenting rhythm was AF with controlled v-rate at time of transmission.  She has HX of AF and is currently taking Coumadin. 1 episode today @ 0755 of NSVT that was 16 beats in duration and daughter reports patient had no change I condition at that time and was making her bed. Daughter reports patient has had decreased energy and increased SOB with activity over the past month. F/U with cardiology 09/24/19.

## 2019-09-13 NOTE — Telephone Encounter (Signed)
Spoke to patient's daughter [name] Dr.Jordan's advice given.I will send message to device clinic for a remote check to make sure rate is controlled.Daughter stated she has been checking pulse and it has been ranging over 100,103.Mother is scheduled for a echo Mon 09/17/19.Follow up appointment scheduled with Roby Lofts PA 09/24/19 at 11:00 am.Advised to bring list of B/P and pulse readings.

## 2019-09-13 NOTE — Telephone Encounter (Signed)
Spoke with Brenda(daughter) and she will send remote transmission to be evaluated. Reports patient's HR now in the 70's after she received her meds earlier. Patient is asymptomatic at this time.

## 2019-09-17 ENCOUNTER — Ambulatory Visit (HOSPITAL_COMMUNITY): Payer: Medicare Other | Attending: Cardiovascular Disease

## 2019-09-17 ENCOUNTER — Other Ambulatory Visit: Payer: Self-pay

## 2019-09-17 DIAGNOSIS — I350 Nonrheumatic aortic (valve) stenosis: Secondary | ICD-10-CM | POA: Insufficient documentation

## 2019-09-19 ENCOUNTER — Telehealth: Payer: Self-pay | Admitting: Medical

## 2019-09-19 NOTE — Telephone Encounter (Signed)
Daughter of the patient would like someone to call her to discuss the patient's recent Echo results. The daughter sees the results on MyChart, but does not quite understand what the numbers mean

## 2019-09-19 NOTE — Telephone Encounter (Signed)
DPR on file for [employer]. Informed [name] of the following result note for pt recent echo: ECHOCARDIOGRAM COMPLETE: Result Notes  Abigail Butts., PA-C  09/19/2019 11:21 AM EST    Please notify the patient that the ultrasound of her heart shows progression of her aortic valve stiffness. This could be contributing to some of her symptoms recently. I have reviewed the ultrasound results with Dr. [name] and we will discuss these results further, as well as next steps at her visit 09/24/19. Thank you!    She verbalized understanding and states pt has been a little SOB with exertion since 12/30 OV but has no CP. She states pt seems to be retaining fluid. Per [name], she was giving pt Prn Lasix once a month but has been giving Lasix to pt more frequently. She states she keeps track of pt BP and HR. Per [name], BP earlier this morning 155/95 and HR 100 and at 10:15 am 143/85 and HR 93. She states she checks pt BP and HR before meds, about an hour after meds, and also checks vitals in the evening. [name] would like to know if any recommendations prior to 09/24/2019. Encounter routed to [employer], [employer]

## 2019-09-20 ENCOUNTER — Other Ambulatory Visit: Payer: Self-pay | Admitting: Cardiology

## 2019-09-20 ENCOUNTER — Other Ambulatory Visit: Payer: Self-pay | Admitting: Internal Medicine

## 2019-09-20 MED ORDER — NITROGLYCERIN 0.4 MG SL SUBL: SUBLINGUAL_TABLET | SUBLINGUAL | 99 refills | Status: AC

## 2019-09-20 NOTE — Telephone Encounter (Signed)
Incoming call from scheduling. [name] returning call. Informed her of the following:  "No change for now. She can take lasix daily if needed until her appointment. Thank her for keeping a close eye on her blood pressure - if she can bring them to the appointment on Monday that would be great! Thank you!"  She verbalized understanding

## 2019-09-20 NOTE — Telephone Encounter (Signed)
Contacted [employer]. LMTCB

## 2019-09-21 ENCOUNTER — Other Ambulatory Visit: Payer: Self-pay | Admitting: *Deleted

## 2019-09-21 ENCOUNTER — Telehealth: Payer: Self-pay

## 2019-09-21 MED ORDER — POTASSIUM CHLORIDE ER 10 MEQ PO TBCR: 10.0000 meq | EXTENDED_RELEASE_TABLET | Freq: Every day | ORAL | 1 refills | Status: DC

## 2019-09-21 NOTE — Telephone Encounter (Addendum)
Left voice message for the patient and her daughter to give me a call to discuss her results.  ----- Message from Abigail Butts, PA-C sent at 09/19/2019 11:21 AM EST ----- Please notify the patient that the ultrasound of her heart shows progression of her aortic valve stiffness. This could be contributing to some of her symptoms recently. I have reviewed the ultrasound results with Dr. [name] and we will discuss these results further, as well as next steps at her visit 09/24/19. Thank you!

## 2019-09-23 NOTE — Progress Notes (Signed)
Cardiology Office Note   Date:  09/24/2019   ID:  [name], DOB 1934/04/06, MRN [id number]  PCP:  [name], MD  Cardiologist:  [name], MD EP: Thompson Grayer, MD  Chief Complaint  Patient presents with  . Shortness of Breath      History of Present Illness: [name] is a 86 y.o. 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  She was last evaluated by cardiology at an outpatient visit with myself 09/05/2019, at which time she was doing well from a cardiac standpoint. Given time since last echo, she was recommended to undergo repeat study which showed EF 60-65%, moderate LVH, unable to assess LV diastolic function, no RWMA, mild LAE, mild MR, and severely calcified AV with suspected low-flow low gradient moderate-severe AS and moderate AI. Since that visit she reported increased DOE requiring more lasix, as well at increased heart rates. On review of her echocardiogram, suspect she is back in atrial fibrillation.   She presents today for follow-up of her CHF and recent echocardiogram. Since her last visit she has reported increased DOE, occasional chest pressure, weight gain, and LE edema. She reports some palpitations as well. She has taken lasix 3 times since her last visit and reports improvement in her SOB each time. Her blood pressure's have been ranging from 110s/60s to 150s/100s and HR's from 70s-100s. No lightheadedness, dizziness, or syncope. She denies orthopnea or PND.   Past Medical History:  Diagnosis Date  . Adenocarcinoma of stomach ([place]) 11/11/2015   gastric mass on egd  . Anal fissure   . Anemia    hx 3/17 iron infusion, on aranesp and injected B12 since  11/2015.   [name] Anxiety   . Aortic root dilatation ([place])   . Arthritis    oa  . 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 CAD (coronary artery disease)   . Chronic kidney disease    only has one kidney rt lft rem 03 ca     dr. Risa Grill, bladder cancer 05-04-2018  . Elevated LFTs    hepatic steatosis, none recent  . Gastroparesis   . GERD (gastroesophageal reflux disease)   . History of uterine cancer 1980s   treated with hysterectomy, external radiation and radiation seed implants.   [name] HTN (hypertension)   . Hyperlipemia   . Hypothyroidism   . Iron deficiency anemia due to chronic blood loss 11/24/2015  . LVH (left ventricular hypertrophy) 11/14/2015   Mild, noted on ECHO  . Moderate aortic stenosis   . Neuropathy    right leg  . No blood products 02/27/2016  . Osteomyelitis ([place]) as child  . PAF (paroxysmal atrial fibrillation) ([place])   . PONV (postoperative nausea and vomiting)    long time ago  . Presence of permanent cardiac pacemaker   . Radiation proctitis   . Restless leg syndrome   . Stroke ([place]) 15   partial stroke left legally blind; left eye   . Tachycardia-bradycardia syndrome ([place])    s/p PPM by Dr Doreatha Lew (MDT) 07/03/10    Past Surgical History:  Procedure Laterality Date  . CARDIAC CATHETERIZATION  2008  . CHOLECYSTECTOMY  1970s  . COLONOSCOPY N/A 11/11/2015   Procedure: COLONOSCOPY;  Surgeon: [name], MD;  [facility];  Service: Endoscopy;  Laterality: N/A;  . CYSTOSCOPY W/ RETROGRADES Right 05/01/2018   Procedure: CYSTOSCOPY WITH RIGHT RETROGRADE PYELOGRAM;  Surgeon: Lucas Mallow, MD;  [facility];  Service: Urology;  Laterality: Right;  . CYSTOSCOPY W/ RETROGRADES Right 07/21/2018   Procedure: CYSTOSCOPY WITH RIGHT RETROGRADE PYELOGRAM;  Surgeon: Alexis Frock, MD;  [facility];  Service: Urology;  Laterality: Right;  . ESOPHAGOGASTRODUODENOSCOPY N/A 11/11/2015   Procedure: ESOPHAGOGASTRODUODENOSCOPY (EGD);  Surgeon: [name], MD;   Location: [facility];  Service: Endoscopy;  Laterality: N/A;  . ESOPHAGOGASTRODUODENOSCOPY N/A 02/05/2016   Procedure: ESOPHAGOGASTRODUODENOSCOPY (EGD);  Surgeon: Ladene Artist, MD;  Location: [facility] END[facility];  Service: Endoscopy;  Laterality: N/A;  . ESOPHAGOGASTRODUODENOSCOPY N/A 04/02/2016   Procedure: ESOPHAGOGASTRODUODENOSCOPY (EGD);  Surgeon: [name], MD;  Location: [facility];  Service: Endoscopy;  Laterality: N/A;  . ESOPHAGOGASTRODUODENOSCOPY (EGD) WITH PROPOFOL N/A 04/06/2017   Procedure: ESOPHAGOGASTRODUODENOSCOPY (EGD) WITH PROPOFOL;  Surgeon: [name], MD;  [facility];  Service: Endoscopy;  Laterality: N/A;  . GASTRECTOMY N/A 01/15/2016   Procedure: OPEN PARTIAL GASTRECTOMY;  Surgeon: Stark Klein, MD;  Location: [place];  Service: General;  Laterality: N/A;  . GASTRECTOMY Left 02/24/2016   Procedure: OPEN J TUBE;  Surgeon: Stark Klein, MD;  Location: [place];  Service: General;  Laterality: Left;  [name] GASTROJEJUNOSTOMY N/A 05/18/2016   Procedure: ROUX EN [place];  Surgeon: Stark Klein, MD;  Location: [place];  Service: General;  Laterality: N/A;  . I & D KNEE WITH POLY EXCHANGE Right 12/17/2013   Procedure: IRRIGATION AND DEBRIDEMEN RIGHT TOTAL  KNEE WITH POLY EXCHANGE;  Surgeon: Mauri Pole, MD;  [facility];  Service: Orthopedics;  Laterality: Right;  . IR GENERIC HISTORICAL  06/17/2016   IR RADIOLOGIST EVAL & MGMT 06/17/2016 Ascencion Dike, PA-C [facility] INTERV RAD  . LAPAROSCOPY N/A 01/15/2016   Procedure: LAPAROSCOPY DIAGNOSTIC;  Surgeon: Stark Klein, MD;  Location: [place];  Service: General;  Laterality: N/A;  . LYSIS OF ADHESION  05/18/2016   Procedure: LYSIS OF ADHESION;  Surgeon: Stark Klein, MD;  Location: [place];  Service: General;;  . NEPHRECTOMY Left    renal cell cancer  . PACEMAKER INSERTION  07/04/2011   MDT PPM implanted by Dr Doreatha Lew for tachy/brady; managed by Dr Thompson Grayer   . PATELLAR TENDON REPAIR Right 03/06/2014   Procedure:  AVULSION WITH PATELLA TENDON REPAIR;  Surgeon: Mauri Pole, MD;  [facility];  Service: Orthopedics;  Laterality: Right;  . PATENT DUCTUS ARTERIOUS REPAIR  ~ 1949  . TOTAL ABDOMINAL HYSTERECTOMY  85  . TOTAL HIP REVISION Right 2009   initial replacment surg  . TOTAL KNEE ARTHROPLASTY  02/07/2012   Procedure: TOTAL KNEE ARTHROPLASTY;  Surgeon: Mauri Pole, MD;  [facility];  Service: Orthopedics;  Laterality: Right;  . TOTAL KNEE REVISION Right 07/29/2014   Procedure: RIGHT KNEE REVISION OF PREVIOUS REPAIR EXTENSOR MECHANISM;  Surgeon: Mauri Pole, MD;  [facility];  Service: Orthopedics;  Laterality: Right;  . TRANSURETHRAL RESECTION OF BLADDER TUMOR N/A 07/21/2018   Procedure: TRANSURETHRAL RESECTION OF BLADDER TUMOR (TURBT);  Surgeon: Alexis Frock, MD;  [facility];  Service: Urology;  Laterality: N/A;  . TRANSURETHRAL RESECTION OF BLADDER TUMOR WITH MITOMYCIN-C N/A 05/01/2018   Procedure: TRANSURETHRAL RESECTION OF BLADDER TUMOR WITH POST OP INSTILLATION OF GEMCITABINE;  Surgeon: Lucas Mallow, MD;  [facility];  Service: Urology;  Laterality:  N/A;  . WHIPPLE PROCEDURE N/A 05/18/2016   Procedure: EXPLORATORY LAPAROTOMY;  Surgeon: Stark Klein, MD;  Location: [place];  Service: General;  Laterality: N/A;     Current Outpatient Medications  Medication Sig Dispense Refill  . acetaminophen (TYLENOL) 500 MG tablet Take 500-1,000 mg by mouth every 6 (six) hours as needed for mild pain or headache.     . ALPRAZolam (XANAX) 0.5 MG tablet Take 0.5 mg by mouth as needed for anxiety.    . Alum & Mag Hydroxide-Simeth (MYLANTA PO) Take 20 mLs by mouth daily.    . Ascorbic Acid (VITAMIN C) 1000 MG tablet Take 1,000 mg by mouth daily.    . Biotin 10 MG CAPS Take 10 mg by mouth daily.     . cetirizine (ZYRTEC) 10 MG tablet Take 10 mg by mouth daily.    . Cholecalciferol (VITAMIN D-3) 5000 units TABS Take 5,000 Units by mouth 3 (three) times daily.     . famotidine  (PEPCID) 40 MG tablet Take 1 tablet Daily for Indigestion & Heartburn 90 tablet 3  . furosemide (LASIX) 20 MG tablet Take 20 mg by mouth as needed.    . gabapentin (NEURONTIN) 300 MG capsule Take 1 capsule by mouth 2 (two) times a day.    . IRON, FERROUS SULFATE, PO Take 325 mg by mouth daily.     [name] levothyroxine (SYNTHROID) 100 MCG tablet Take 1/2 tablet daily for thyroid. 45 tablet 1  . loperamide (IMODIUM A-D) 2 MG tablet Take up to 12 tablets /day as needed for  for Diarrhea 120 tablet 11  . loratadine (CLARITIN) 10 MG tablet Take 10 mg by mouth daily.    [name] losartan (COZAAR) 25 MG tablet Take 2 tablets (50 mg total) by mouth daily. 360 tablet 1  . metoprolol tartrate (LOPRESSOR) 100 MG tablet Take 1 tablet 2 x /day for BP 30 tablet 6  . nitroGLYCERIN (NITROSTAT) 0.4 MG SL tablet Dissolve 1 tablet under tongue every 5 minutes if needed for Angina 50 tablet prn  . ondansetron (ZOFRAN-ODT) 8 MG disintegrating tablet Take 8 mg by mouth every 8 (eight) hours as needed for nausea or vomiting.     . potassium chloride (KLOR-CON) 10 MEQ tablet Take 1 tablet (10 mEq total) by mouth daily. 90 tablet 1  . simethicone (MYLICON) 80 MG chewable tablet Chew 80 mg by mouth every 6 (six) hours as needed for flatulence.    . sucralfate (CARAFATE) 1 g tablet TAKE 1 TABLET (1 G TOTAL) BY MOUTH 4 (FOUR) TIMES DAILY - WITH MEALS AND AT BEDTIME. (Patient taking differently: Take 1 g by mouth 3 (three) times daily. ) 360 tablet 3  . triamcinolone cream (KENALOG) 0.1 % Apply 1 application topically as needed.    . verapamil (CALAN-SR) 120 MG CR tablet Take 1 tablet 2 x /day with a meal for BP & Heart Rhythm 180 tablet 3  . warfarin (COUMADIN) 2 MG tablet TAKE 1 TO 2 TABLETS AS DIRECTED TO PREVENT BLOOD CLOTS 180 tablet 1   No current facility-administered medications for this visit.    Allergies:   Adhesive [tape], Amiodarone, Hydrocodone, Remeron [mirtazapine], Codeine, Morphine and related, Reglan  [metoclopramide], Requip [ropinirole hcl], Zinc, and Other    Social History:  The patient  reports that she has never smoked. She has never used smokeless tobacco. She reports that she does not drink alcohol or use drugs.   Family History:  The patient's family history includes Colon cancer (age  of onset: 106) in her mother; Heart attack in her father; Heart disease in her father, maternal grandmother, and mother.    ROS:  Please see the history of present illness.   Otherwise, review of systems are positive for none.   All other systems are reviewed and negative.    PHYSICAL EXAM: VS:  BP (!) 138/104   Pulse (!) 103   Temp 97.9 F (36.6 C)   Ht 4\' 11"  (1.499 m)   Wt 101 lb 12.8 oz (46.2 kg)   SpO2 96%   BMI 20.56 kg/m  , BMI Body mass index is 20.56 kg/m. GEN: Well nourished, well developed, in no acute distress HEENT: sclera anicteric Neck: no JVD, carotid bruits, or masses Cardiac: IRIR; + murmurs, no rubs or gallops, trace LE edema  Respiratory:  clear to auscultation bilaterally, normal work of breathing GI: soft, nontender, nondistended, + BS MS: no deformity or atrophy Skin: warm and dry, no rash Neuro:  Strength and sensation are intact Psych: euthymic mood, full affect   EKG:  EKG is ordered today. The ekg ordered today demonstrates atrial fibrillation with rate 97 bpm, no STE/D, chronic inferolateral T wave abnormalities, QTc 533   Recent Labs: 03/02/2019: NT-Pro BNP 10,870 08/08/2019: ALT 17; BUN 17; Creat 1.09; Hemoglobin 13.8; Magnesium 1.8; Platelets 192; Potassium 3.9; Sodium 145; TSH 2.23    Lipid Panel    Component Value Date/Time   CHOL 113 08/08/2019 1030   TRIG 100 08/08/2019 1030   HDL 40 (L) 08/08/2019 1030   CHOLHDL 2.8 08/08/2019 1030   VLDL 29 03/23/2017 1445   LDLCALC 54 08/08/2019 1030      Wt Readings from Last 3 Encounters:  09/24/19 101 lb 12.8 oz (46.2 kg)  09/05/19 99 lb 9.6 oz (45.2 kg)  08/08/19 99 lb 12.8 oz (45.3 kg)       Other studies Reviewed: Additional studies/ records that were reviewed today include:   NST 2017:   The left ventricular ejection fraction is normal (55-65%).  Nuclear stress EF: 62%.  The study is normal.  Normal stress nuclear study with no ischemia or infarction; EF 62 with normal wall motion; note patient in atrial flutter at time of study.  Echocardiogram 09/17/19: 1. Left ventricular ejection fraction, by visual estimation, is 60 to 65%. The left ventricle has normal function. There is moderately increased left ventricular hypertrophy.  2. Left ventricular diastolic function could not be evaluated.  3. Small left ventricular internal cavity size.  4. The left ventricle has no regional wall motion abnormalities.  5. Global right ventricle has normal systolic function.The right ventricular size is normal. No increase in right ventricular wall thickness.  6. Left atrial size was moderately dilated.  7. The mitral valve is degenerative. Mild mitral valve regurgitation.  8. The tricuspid valve is grossly normal.  9. The aortic valve is severely calcified with restricted leaflet motion. Vmax 3.3 m/s, mean gradient 25 mmHG, DI 0.21, AVA 0.60 cm2. SV=43 cc and SVi=31 cc/m2. Suspect there is paradoxical low-flow low-gradient severe aortic stenosis. Moderate aortic  regurgitation is present. Recommend an aortic valve calcium score for clarification of AoV stenosis severity. 10. Aortic valve regurgitation is moderate. 11. The aortic valve is tricuspid. Aortic valve regurgitation is moderate. Moderate to severe aortic valve stenosis. 12. Mildly elevated pulmonary artery systolic pressure. 13. The tricuspid regurgitant velocity is 2.68 m/s, and with an assumed right atrial pressure of 15 mmHg, the estimated right ventricular systolic pressure is mildly elevated at  43.8 mmHg. 14. A pacer wire is visualized in the RA and RV. 15. The inferior vena cava is dilated in size with <50%  respiratory variability, suggesting right atrial pressure of 15 mmHg.   ASSESSMENT AND PLAN:   1. Acute on chronic diastolic CHF: possibly related to her atrial fibrillation. Weight 101.8lbs today up from 99lbs 09/05/19. She reports symptom improvement with po lasix but has been limiting use given c/f solitary kidney.  - Will give lasix 20mg  daily x 3 days then resume prn dosing - Will increase metoprolol to 100mg  BID for improved HR control for management of #2.  2. Paroxysmal atrial fibrillation: review of her echocardiogram suggests she was likely in atrial fibrillation at that time. Possible this has contributed to #1. Follows with the coumadin clinic with INR 2.0 08/29/2019. AF burden is 10.1% according to PPM interrogation 08/30/2019 - Continue coumadin for stroke ppx - Will increase metoprolol to 100mg  BID (she had only been taking 50mg  BID). - Continue verapamil for rate control - Could consider amiodarone if atrial fibrillation persists and rate control is difficult to achieve.   3. Aortic valvulopathy: recent echocardiogram suggests low-flow low gradient moderate-severe AS and moderate AI. She reports occasional chest pressure and lightheadedness but no recent syncopal complaints, though does report possible syncope 02/2019. Discussed these findings with Dr. [name]. - Will plan for cardiac catheterization to further evaluate her aortic valve. The patient understands that risks included but are not limited to stroke (1 in 1000), death (1 in 72), kidney failure [usually temporary] (1 in 500), bleeding (1 in 200), allergic reaction [possibly serious] (1 in 200).  The patient understands and agrees to proceed.  - Will plan to hold coumadin 5 days per Dr. [name]. Discussed with pharmacy who will coordinate a lovenox bridge for upcoming cath 10/09/19. - Will send a referral to the structural heart team for TAVR consideration  4. HTN: BP 138/104 today. Ranging from 110s/60s to 150s/100s at  home - Will increase metoprolol as above - Continue losartan and verapamil   5. Tachy-brady syndrome s/p PPM: device interrogation 08/30/2019 with normal function; AF burden is 10.1%.  - Continue routine PPM monitoring per Dr. Rayann Heman  6. Carotid artery disease: mild stenosis noted on carotid dopplers 08/15/2019 - Recommend annual follow-up doppler studies   Current medicines are reviewed at length with the patient today.  The patient does not have concerns regarding medicines.  The following changes have been made:  As above  Labs/ tests ordered today include:   Orders Placed This Encounter  Procedures  . Basic metabolic panel  . Ambulatory referral to Structural Heart/Valve Clinic (only at [place])  . EKG 12-Lead     Disposition:   FU with Dr. [name] or myself within 1 week of her cardiac cath.   Signed, Abigail Butts, PA-C  09/24/2019 11:09 PM

## 2019-09-24 ENCOUNTER — Ambulatory Visit: Payer: Medicare Other | Admitting: Medical

## 2019-09-24 ENCOUNTER — Telehealth: Payer: Self-pay | Admitting: Pharmacist

## 2019-09-24 ENCOUNTER — Other Ambulatory Visit: Payer: Self-pay

## 2019-09-24 ENCOUNTER — Encounter: Payer: Self-pay | Admitting: Medical

## 2019-09-24 VITALS — BP 138/104 | HR 103 | Temp 97.9°F | Ht 59.0 in | Wt 101.8 lb

## 2019-09-24 DIAGNOSIS — I350 Nonrheumatic aortic (valve) stenosis: Secondary | ICD-10-CM | POA: Diagnosis not present

## 2019-09-24 DIAGNOSIS — I495 Sick sinus syndrome: Secondary | ICD-10-CM

## 2019-09-24 DIAGNOSIS — I5033 Acute on chronic diastolic (congestive) heart failure: Secondary | ICD-10-CM

## 2019-09-24 DIAGNOSIS — I5032 Chronic diastolic (congestive) heart failure: Secondary | ICD-10-CM | POA: Diagnosis not present

## 2019-09-24 DIAGNOSIS — Z01810 Encounter for preprocedural cardiovascular examination: Secondary | ICD-10-CM

## 2019-09-24 DIAGNOSIS — E782 Mixed hyperlipidemia: Secondary | ICD-10-CM

## 2019-09-24 DIAGNOSIS — Z7901 Long term (current) use of anticoagulants: Secondary | ICD-10-CM

## 2019-09-24 DIAGNOSIS — I10 Essential (primary) hypertension: Secondary | ICD-10-CM

## 2019-09-24 DIAGNOSIS — I480 Paroxysmal atrial fibrillation: Secondary | ICD-10-CM

## 2019-09-24 NOTE — Telephone Encounter (Signed)
-----   Message from [place], [facility] sent at 09/24/2019 11:53 AM EST ----- Regarding: Bridging Good morning,  Patient's INR due 09/26/2019. CathLab scheduled for Feb/2 and Dr [name] wants to hold warfarin for 5 days.   Please arrange bridging therapy (hx of stroke per Daleen Snook PA-C and curre tly on Afib)

## 2019-09-24 NOTE — Telephone Encounter (Signed)
Spoke with patient's daughter, [name]. She is comfortable helping pt with Lovenox injections for 2/2 procedure. Home health is scheduled to check INR on 1/20. Will also have them check an INR on 1/25 or 1/26 about 1 week prior to procedure. Pt has MyChart and we will send instructions to pt and her daughter via MyChart for Lovenox bridging. We have an updated weight from today's office visit and recent BMET from December 2020. Daughter is aware we will send in Lovenox rx when we provide Lovenox instructions via MyChart the week of 1/25.

## 2019-09-24 NOTE — Patient Instructions (Addendum)
Medication Instructions:  Take 100mg  metoprolol twice daily. Take 20mg  Lasix for 3 days then back as needed.  If you need a refill on your cardiac medications before your next appointment, please call your pharmacy.   Lab work: BMET If you have labs (blood work) drawn today and your tests are completely normal, you will receive your results only by: [place] (if you have MyChart) OR A paper copy in the mail If you have any lab test that is abnormal or we need to change your treatment, we will call you to review the results.  Testing/Procedures: Your physician has requested that you have a cardiac catheterization with Dr. [name] on 10/09/19. Cardiac catheterization is used to diagnose and/or treat various heart conditions. Doctors may recommend this procedure for a number of different reasons. The most common reason is to evaluate chest pain. Chest pain can be a symptom of coronary artery disease (CAD), and cardiac catheterization can show whether plaque is narrowing or blocking your heart's arteries. This procedure is also used to evaluate the valves, as well as measure the blood flow and oxygen levels in different parts of your heart. For further information please visit [email or web address]. Please follow instruction sheet, as given.  Follow-Up: At [facility], you and your health needs are our priority.  As part of our continuing mission to provide you with exceptional heart care, we have created designated Provider Care Teams.  These Care Teams include your primary Cardiologist (physician) and Advanced Practice Providers (APPs -  Physician Assistants and Nurse Practitioners) who all work together to provide you with the care you need, when you need it. You may see [name], MD or one of the following Advanced Practice Providers on your designated Care Team:    Kerin Ransom, PA-C  [place], [place]  Coletta Memos,   Your physician wants you to follow-up in: The week of  09/14/19-09/18/19 with Dr [name].   You have been referred to the Structural Heart Clinic. They will be in touch.   Any Other Special Instructions Will Be Listed Below (If Applicable).   You are scheduled for a Cardiac Catheterization on Tuesday, February 2 with Dr. [name].  1. Please arrive at the [facility] (Main Entrance A) at [facility]: [address] [place], [place] [address] at 5:30 AM (This time is two hours before your procedure to ensure your preparation). Free valet parking service is available.   Special note: Every effort is made to have your procedure done on time. Please understand that emergencies sometimes delay scheduled procedures.  2. Diet: Do not eat solid foods after midnight.  The patient may have clear liquids until 5am upon the day of the procedure.  3. Labs: You will need to have blood drawn on Tuesday, January 26 at [place]  Open: 8am - 5pm (Lunch 12:30 - 1:30)   Phone: [phone]. You do not need to be fasting.  4. Medication instructions in preparation for your procedure:   Contrast Allergy: No  Stop taking Coumadin (Warfarin) on Thursday, January 28.   On the morning of your procedure, take your Aspirin and any morning medicines NOT listed above.  You may use sips of water.  5. Plan for one night stay--bring personal belongings. 6. Bring a current list of your medications and current insurance cards. 7. You MUST have a responsible person to drive you home. 8. Someone MUST be with you the first 24 hours after you arrive  home or your discharge will be delayed. 9. Please wear clothes that are easy to get on and off and wear slip-on shoes.  You have to be tested for Covid. This will be on Friday, January 29th at 12:30. This is a Drive Up Visit at the [facility] [address] [place]. Someone will direct you to the appropriate testing line. Stay in your car and someone will be with  you shortly.  Thank you for allowing [place] to care for you!   -- [place] [facility]

## 2019-09-25 NOTE — Progress Notes (Signed)
This encounter was created in error - please disregard.

## 2019-09-25 NOTE — Addendum Note (Signed)
Addended by: Jacqulynn Cadet on: 09/25/2019 07:59 AM   Modules accepted: Orders

## 2019-09-26 ENCOUNTER — Other Ambulatory Visit: Payer: Self-pay

## 2019-09-26 ENCOUNTER — Ambulatory Visit: Payer: Medicare Other | Admitting: Cardiovascular Disease

## 2019-09-26 ENCOUNTER — Encounter: Payer: Self-pay | Admitting: Cardiovascular Disease

## 2019-09-26 VITALS — BP 142/70 | HR 70 | Ht 59.0 in | Wt 102.0 lb

## 2019-09-26 DIAGNOSIS — Z01810 Encounter for preprocedural cardiovascular examination: Secondary | ICD-10-CM | POA: Diagnosis not present

## 2019-09-26 DIAGNOSIS — I350 Nonrheumatic aortic (valve) stenosis: Secondary | ICD-10-CM

## 2019-09-26 NOTE — H&P (View-Only) (Signed)
Structural Heart Clinic Consult Note  Chief Complaint  Patient presents with  . New Patient (Initial Visit)    Severe AS   History of Present Illness: 86 yo female with history of chronic diastolic CHF, PAF, tachy-brady syndrome s/p pacemaker, HTN, HLD, gastric cancer, renal cell carcinoma s/p nephrectomy, CKD stage 3 , anemia, recent bladder cancer and severe aortic stenosis who is here today as a new consult in the structural heart clinic, referred by Dr. [name], for discussion regarding her aortic stenosis and possible TAVR. She has been followed for moderate aortic stenosis. Echo 09/17/19 with LVEF=60-65%, moderate LVH. Mild MR. The aortic valve leaflets are thickened and calcified with limited leaflet mobility. Mean gradient 25 mmHg, peak gradient 45 mmHg, AVA 0.6 cm2, dimensionless index 0.21. This is consistent with severe, paradoxical, low flow, low gradient AS. Moderate AI. She is a Sales promotion account executive witness. She has had progressive dyspnea and fatigue with some fluid retention. No chest pain. No syncope.   She sees the dentist regularly and has no active dental issues. She and her daughter live together in [place]. She is retired from [employer].   Primary Care Physician: [name], MD Primary Cardiologist: [name] Referring Cardiologist: [name]  Past Medical History:  Diagnosis Date  . Adenocarcinoma of stomach ([place]) 11/11/2015   gastric mass on egd  . Anal fissure   . Anemia    hx 3/17 iron infusion, on aranesp and injected B12 since 11/2015.   [name] Anxiety   . Aortic root dilatation ([place])   . Aortic stenosis   . Arthritis    oa  . 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 CAD (coronary artery disease)   . Chronic kidney disease    only has one kidney rt lft rem 03 ca     dr. Risa Grill, bladder cancer 05-04-2018  . Elevated LFTs    hepatic steatosis, none recent  . Gastroparesis   . GERD (gastroesophageal reflux disease)   . History of uterine cancer 1980s   treated with  hysterectomy, external radiation and radiation seed implants.   [name] HTN (hypertension)   . Hyperlipemia   . Hypothyroidism   . Iron deficiency anemia due to chronic blood loss 11/24/2015  . LVH (left ventricular hypertrophy) 11/14/2015   Mild, noted on ECHO  . Moderate aortic stenosis   . Neuropathy    right leg  . No blood products 02/27/2016  . Osteomyelitis ([place]) as child  . PAF (paroxysmal atrial fibrillation) ([place])   . PONV (postoperative nausea and vomiting)    long time ago  . Presence of permanent cardiac pacemaker   . Radiation proctitis   . Restless leg syndrome   . Stroke ([place]) 15   partial stroke left legally blind; left eye   . Tachycardia-bradycardia syndrome ([place])    s/p PPM by Dr Doreatha Lew (MDT) 07/03/10    Past Surgical History:  Procedure Laterality Date  . CARDIAC CATHETERIZATION  2008  . CHOLECYSTECTOMY  1970s  . COLONOSCOPY N/A 11/11/2015   Procedure: COLONOSCOPY;  Surgeon: [name], MD;  [facility]: [facility];  Service: Endoscopy;  Laterality: N/A;  . CYSTOSCOPY W/ RETROGRADES Right 05/01/2018   Procedure: CYSTOSCOPY WITH RIGHT RETROGRADE PYELOGRAM;  Surgeon: Lucas Mallow, MD;  [facility];  Service: Urology;  Laterality: Right;  . CYSTOSCOPY W/ RETROGRADES Right 07/21/2018   Procedure: CYSTOSCOPY WITH RIGHT RETROGRADE PYELOGRAM;  Surgeon: Alexis Frock, MD;  [facility];  Service: Urology;  Laterality: Right;  .  ESOPHAGOGASTRODUODENOSCOPY N/A 11/11/2015   Procedure: ESOPHAGOGASTRODUODENOSCOPY (EGD);  Surgeon: [name], MD;  Location: [facility];  Service: Endoscopy;  Laterality: N/A;  . ESOPHAGOGASTRODUODENOSCOPY N/A 02/05/2016   Procedure: ESOPHAGOGASTRODUODENOSCOPY (EGD);  Surgeon: Ladene Artist, MD;  Location[facility];  Service: Endoscopy;  Laterality: N/A;  . ESOPHAGOGASTRODUODENOSCOPY N/A 04/02/2016   Procedure: ESOPHAGOGASTRODUODENOSCOPY (EGD);  Surgeon: [name], MD;  Location: [facility];  Service: Endoscopy;   Laterality: N/A;  . ESOPHAGOGASTRODUODENOSCOPY (EGD) WITH PROPOFOL N/A 04/06/2017   Procedure: ESOPHAGOGASTRODUODENOSCOPY (EGD) WITH PROPOFOL;  Surgeon: [name], MD;  [facility];  Service: Endoscopy;  Laterality: N/A;  . GASTRECTOMY N/A 01/15/2016   Procedure: OPEN PARTIAL GASTRECTOMY;  Surgeon: Stark Klein, MD;  Location: [place];  Service: General;  Laterality: N/A;  . GASTRECTOMY Left 02/24/2016   Procedure: OPEN J TUBE;  Surgeon: Stark Klein, MD;  Location: [place];  Service: General;  Laterality: Left;  [name] GASTROJEJUNOSTOMY N/A 05/18/2016   Procedure: ROUX EN [place];  Surgeon: Stark Klein, MD;  Location: [facility];  Service: General;  Laterality: N/A;  . I & D KNEE WITH POLY EXCHANGE Right 12/17/2013   Procedure: IRRIGATION AND DEBRIDEMEN RIGHT TOTAL  KNEE WITH POLY EXCHANGE;  Surgeon: Mauri Pole, MD;  [facility];  Service: Orthopedics;  Laterality: Right;  . IR GENERIC HISTORICAL  06/17/2016   IR RADIOLOGIST EVAL & MGMT 06/17/2016 Ascencion Dike, PA-C [facility] INTERV RAD  . LAPAROSCOPY N/A 01/15/2016   Procedure: LAPAROSCOPY DIAGNOSTIC;  Surgeon: Stark Klein, MD;  Location: [facility];  Service: General;  Laterality: N/A;  . LYSIS OF ADHESION  05/18/2016   Procedure: LYSIS OF ADHESION;  Surgeon: Stark Klein, MD;  Location: [place];  Service: General;;  . NEPHRECTOMY Left    renal cell cancer  . PACEMAKER INSERTION  07/04/2011   MDT PPM implanted by Dr Doreatha Lew for tachy/brady; managed by Dr Thompson Grayer   . PATELLAR TENDON REPAIR Right 03/06/2014   Procedure: AVULSION WITH PATELLA TENDON REPAIR;  Surgeon: Mauri Pole, MD;  [facility];  Service: Orthopedics;  Laterality: Right;  . PATENT DUCTUS ARTERIOUS REPAIR  ~ 1949  . TOTAL ABDOMINAL HYSTERECTOMY  85  . TOTAL HIP REVISION Right 2009   initial replacment surg  . TOTAL KNEE ARTHROPLASTY  02/07/2012   Procedure: TOTAL KNEE ARTHROPLASTY;  Surgeon: Mauri Pole, MD;  [facility];  Service: Orthopedics;   Laterality: Right;  . TOTAL KNEE REVISION Right 07/29/2014   Procedure: RIGHT KNEE REVISION OF PREVIOUS REPAIR EXTENSOR MECHANISM;  Surgeon: Mauri Pole, MD;  [facility];  Service: Orthopedics;  Laterality: Right;  . TRANSURETHRAL RESECTION OF BLADDER TUMOR N/A 07/21/2018   Procedure: TRANSURETHRAL RESECTION OF BLADDER TUMOR (TURBT);  Surgeon: Alexis Frock, MD;  [facility];  Service: Urology;  Laterality: N/A;  . TRANSURETHRAL RESECTION OF BLADDER TUMOR WITH MITOMYCIN-C N/A 05/01/2018   Procedure: TRANSURETHRAL RESECTION OF BLADDER TUMOR WITH POST OP INSTILLATION OF GEMCITABINE;  Surgeon: Lucas Mallow, MD;  [facility];  Service: Urology;  Laterality: N/A;  . WHIPPLE PROCEDURE N/A 05/18/2016   Procedure: EXPLORATORY LAPAROTOMY;  Surgeon: Stark Klein, MD;  Location: [facility];  Service: General;  Laterality: N/A;    Current Outpatient Medications  Medication Sig Dispense Refill  . acetaminophen (TYLENOL) 500 MG tablet Take 500-1,000 mg by mouth every 6 (six) hours as needed for mild pain or headache.     . ALPRAZolam (XANAX) 0.5 MG tablet Take 0.5  mg by mouth as needed for anxiety.    . Alum & Mag Hydroxide-Simeth (MYLANTA PO) Take 20 mLs by mouth daily.    . Ascorbic Acid (VITAMIN C) 1000 MG tablet Take 1,000 mg by mouth daily.    . Biotin 10 MG CAPS Take 10 mg by mouth daily.     . cetirizine (ZYRTEC) 10 MG tablet Take 10 mg by mouth daily.    . Cholecalciferol (VITAMIN D-3) 5000 units TABS Take 5,000 Units by mouth 3 (three) times daily.     . famotidine (PEPCID) 40 MG tablet Take 1 tablet Daily for Indigestion & Heartburn 90 tablet 3  . furosemide (LASIX) 20 MG tablet Take 20 mg by mouth as needed.    . gabapentin (NEURONTIN) 300 MG capsule Take 1 capsule by mouth 2 (two) times a day.    . IRON, FERROUS SULFATE, PO Take 325 mg by mouth daily.     [name] levothyroxine (SYNTHROID) 100 MCG tablet Take 1/2 tablet daily for thyroid. 45 tablet 1  . loperamide (IMODIUM A-D) 2  MG tablet Take up to 12 tablets /day as needed for  for Diarrhea 120 tablet 11  . loratadine (CLARITIN) 10 MG tablet Take 10 mg by mouth daily.    [name] losartan (COZAAR) 25 MG tablet Take 2 tablets (50 mg total) by mouth daily. 360 tablet 1  . metoprolol tartrate (LOPRESSOR) 100 MG tablet Take 1 tablet 2 x /day for BP 30 tablet 6  . nitroGLYCERIN (NITROSTAT) 0.4 MG SL tablet Dissolve 1 tablet under tongue every 5 minutes if needed for Angina 50 tablet prn  . ondansetron (ZOFRAN-ODT) 8 MG disintegrating tablet Take 8 mg by mouth every 8 (eight) hours as needed for nausea or vomiting.     . potassium chloride (KLOR-CON) 10 MEQ tablet Take 1 tablet (10 mEq total) by mouth daily. 90 tablet 1  . simethicone (MYLICON) 80 MG chewable tablet Chew 80 mg by mouth every 6 (six) hours as needed for flatulence.    . sucralfate (CARAFATE) 1 g tablet TAKE 1 TABLET (1 G TOTAL) BY MOUTH 4 (FOUR) TIMES DAILY - WITH MEALS AND AT BEDTIME. (Patient taking differently: Take 1 g by mouth 3 (three) times daily. ) 360 tablet 3  . triamcinolone cream (KENALOG) 0.1 % Apply 1 application topically as needed.    . verapamil (CALAN-SR) 120 MG CR tablet Take 1 tablet 2 x /day with a meal for BP & Heart Rhythm 180 tablet 3  . warfarin (COUMADIN) 2 MG tablet TAKE 1 TO 2 TABLETS AS DIRECTED TO PREVENT BLOOD CLOTS 180 tablet 1   No current facility-administered medications for this visit.    Allergies  Allergen Reactions  . Adhesive [Tape] Other (See Comments)    Tears skin, Please use "paper" tape  . Amiodarone Other (See Comments)     Thyroid and liver and kidney problems  . Hydrocodone Other (See Comments)    HALLLUCINATIONS  . Remeron [Mirtazapine] Other (See Comments)    Hallucinations and make pt loopy  . Codeine Nausea And Vomiting  . Morphine And Related Nausea And Vomiting  . Reglan [Metoclopramide] Hives, Itching and Rash  . Requip [Ropinirole Hcl] Other (See Comments)    Headache   . Zinc Nausea Only  . Other      NO BLOOD PRODUCTS    Social History   Socioeconomic History  . Marital status: Widowed    Spouse name: Not on file  . Number of children: 3  .  Years of education: Not on file  . Highest education level: Not on file  Occupational History  . Not on file  Tobacco Use  . Smoking status: Never Smoker  . Smokeless tobacco: Never Used  Substance and Sexual Activity  . Alcohol use: No  . Drug use: No  . Sexual activity: Never  Other Topics Concern  . Not on file  Social History Narrative   01/01/19 Lives in [place] w/dgtr, [name].  Continues to work.   Social Determinants of Health   Financial Resource Strain:   . Difficulty of Paying Living Expenses: Not on file  Food Insecurity:   . Worried About Charity fundraiser in the Last Year: Not on file  . Ran Out of Food in the Last Year: Not on file  Transportation Needs:   . Lack of Transportation (Medical): Not on file  . Lack of Transportation (Non-Medical): Not on file  Physical Activity:   . Days of Exercise per Week: Not on file  . Minutes of Exercise per Session: Not on file  Stress:   . Feeling of Stress : Not on file  Social Connections:   . Frequency of Communication with Friends and Family: Not on file  . Frequency of Social Gatherings with Friends and Family: Not on file  . Attends Religious Services: Not on file  . Active Member of Clubs or Organizations: Not on file  . Attends Archivist Meetings: Not on file  . Marital Status: Not on file  Intimate Partner Violence:   . Fear of Current or Ex-Partner: Not on file  . Emotionally Abused: Not on file  . Physically Abused: Not on file  . Sexually Abused: Not on file    Family History  Problem Relation Age of Onset  . Colon cancer Mother 66  . Heart disease Mother   . Heart disease Father   . Heart attack Father   . Heart disease Maternal Grandmother     Review of Systems:  As stated in the HPI and otherwise negative.   BP (!) 142/70  (BP Location: Left Arm, Patient Position: Sitting, Cuff Size: Normal)   Pulse 70   Ht 4\' 11"  (1.499 m)   Wt 102 lb (46.3 kg)   SpO2 98%   BMI 20.60 kg/m   Physical Examination: General: Well developed, well nourished, NAD  HEENT: OP clear, mucus membranes moist  SKIN: warm, dry. No rashes. Neuro: No focal deficits  Musculoskeletal: Muscle strength 5/5 all ext  Psychiatric: Mood and affect normal  Neck: No JVD, no carotid bruits, no thyromegaly, no lymphadenopathy.  Lungs:Clear bilaterally, no wheezes, rhonci, crackles Cardiovascular: Regular rate and rhythm. Loud, harsh, late peaking systolic murmur.  Abdomen:Soft. Bowel sounds present. Non-tender.  Extremities: No lower extremity edema. Pulses are 2 + in the bilateral DP/PT.  EKG:  EKG is not ordered today. The ekg ordered today demonstrates   Echo 09/17/19:   1. Left ventricular ejection fraction, by visual estimation, is 60 to 65%. The left ventricle has normal function. There is moderately increased left ventricular hypertrophy.  2. Left ventricular diastolic function could not be evaluated.  3. Small left ventricular internal cavity size.  4. The left ventricle has no regional wall motion abnormalities.  5. Global right ventricle has normal systolic function.The right ventricular size is normal. No increase in right ventricular wall thickness.  6. Left atrial size was moderately dilated.  7. The mitral valve is degenerative. Mild mitral valve regurgitation.  8.  The tricuspid valve is grossly normal.  9. The aortic valve is severely calcified with restricted leaflet motion. Vmax 3.3 m/s, mean gradient 25 mmHG, DI 0.21, AVA 0.60 cm2. SV=43 cc and SVi=31 cc/m2. Suspect there is paradoxical low-flow low-gradient severe aortic stenosis. Moderate aortic  regurgitation is present. Recommend an aortic valve calcium score for clarification of AoV stenosis severity. 10. Aortic valve regurgitation is moderate. 11. The aortic valve is  tricuspid. Aortic valve regurgitation is moderate. Moderate to severe aortic valve stenosis. 12. Mildly elevated pulmonary artery systolic pressure. 13. The tricuspid regurgitant velocity is 2.68 m/s, and with an assumed right atrial pressure of 15 mmHg, the estimated right ventricular systolic pressure is mildly elevated at 43.8 mmHg. 14. A pacer wire is visualized in the RA and RV. 15. The inferior vena cava is dilated in size with <50% respiratory variability, suggesting right atrial pressure of 15 mmHg.  In comparison to the previous echocardiogram(s): EF unchanged. Concerns for paradoxical low-flow low-gradient severe AS on this study. Gradients are higher compared with prior study (11/14/2015). FINDINGS  Left Ventricle: Left ventricular ejection fraction, by visual estimation, is 60 to 65%. The left ventricle has normal function. The left ventricle has no regional wall motion abnormalities. The left ventricular internal cavity size was the LV cavity  size is small. There is moderately increased left ventricular hypertrophy. Concentric left ventricular hypertrophy. The left ventricular diastology could not be evaluated due to atrial fibrillation. Left ventricular diastolic function could not be  evaluated.  Right Ventricle: The right ventricular size is normal. No increase in right ventricular wall thickness. Global RV systolic function is has normal systolic function. The tricuspid regurgitant velocity is 2.68 m/s, and with an assumed right atrial pressure  of 15 mmHg, the estimated right ventricular systolic pressure is mildly elevated at 43.8 mmHg.  Left Atrium: Left atrial size was moderately dilated.  Right Atrium: Right atrial size was mild-moderately dilated  Pericardium: There is no evidence of pericardial effusion.  Mitral Valve: The mitral valve is degenerative in appearance. Moderate mitral annular calcification. Mild mitral valve regurgitation. MV peak gradient, 8.1  mmHg.  Tricuspid Valve: The tricuspid valve is grossly normal. Tricuspid valve regurgitation is mild.  Aortic Valve: The aortic valve is tricuspid. Aortic valve regurgitation is moderate. Aortic regurgitation PHT measures 290 msec. Moderate to severe aortic stenosis is present. Moderate to severe aortic valve annular calcification. Aortic valve mean  gradient measures 25.2 mmHg. Aortic valve peak gradient measures 44.2 mmHg. Aortic valve area, by VTI measures 0.60 cm. The aortic valve is severely calcified with restricted leaflet motion. Vmax 3.3 m/s, mean gradient 25 mmHG, DI 0.21, AVA 0.60 cm2.  SV=43 cc and SVi=31 cc/m2. Suspect there is paradoxical low-flow low-gradient severe aortic stenosis. Moderate aortic regurgitation is present. Recommend an aortic valve calcium score for clarification of AoV stenosis severity.  Pulmonic Valve: The pulmonic valve was grossly normal. Pulmonic valve regurgitation is mild. Pulmonic regurgitation is mild.  Aorta: The aortic root and ascending aorta are structurally normal, with no evidence of dilitation.  Venous: The inferior vena cava is dilated in size with less than 50% respiratory variability, suggesting right atrial pressure of 15 mmHg.  IAS/Shunts: No atrial level shunt detected by color flow Doppler.  Additional Comments: A pacer wire is visualized in the right atrium and right ventricle.   LEFT VENTRICLE PLAX 2D LVIDd:         3.20 cm  Diastology LVIDs:         1.97 cm  LV e' lateral:   8.81 cm/s LV PW:         1.29 cm  LV E/e' lateral: 14.6 LV IVS:        1.20 cm  LV e' medial:    5.66 cm/s LVOT diam:     1.90 cm  LV E/e' medial:  22.8 LV SV:         29 ml LV SV Index:   20.81 LVOT Area:     2.84 cm    RIGHT VENTRICLE RV S prime:     9.46 cm/s TAPSE (M-mode): 1.6 cm  LEFT ATRIUM             Index       RIGHT ATRIUM           Index LA diam:        3.75 cm 2.73 cm/m  RA Area:     20.80 cm LA Vol (A2C):   64.3 ml 46.89  ml/m RA Volume:   65.40 ml  47.69 ml/m LA Vol (A4C):   59.7 ml 43.53 ml/m LA Biplane Vol: 67.7 ml 49.37 ml/m  AORTIC VALVE AV Area (Vmax):    0.66 cm AV Area (Vmean):   0.64 cm AV Area (VTI):     0.60 cm AV Vmax:           332.42 cm/s AV Vmean:          233.799 cm/s AV VTI:            0.719 m AV Peak Grad:      44.2 mmHg AV Mean Grad:      25.2 mmHg LVOT Vmax:         76.97 cm/s LVOT Vmean:        52.486 cm/s LVOT VTI:          0.152 m LVOT/AV VTI ratio: 0.21 AI PHT:            290 msec   AORTA Ao Root diam: 3.65 cm Ao Asc diam:  4.00 cm  MITRAL VALVE                         TRICUSPID VALVE MV Area (PHT): 3.39 cm              TR Peak grad:   28.8 mmHg MV Peak grad:  8.1 mmHg              TR Vmax:        296.00 cm/s MV Mean grad:  2.0 mmHg MV Vmax:       1.42 m/s              SHUNTS MV Vmean:      61.6 cm/s             Systemic VTI:  0.15 m MV VTI:        0.28 m                Systemic Diam: 1.90 cm MV PHT:        64.96 msec MV Decel Time: 224 msec MR Peak grad: 145.4 mmHg MR Mean grad: 89.0 mmHg MR Vmax:      603.00 cm/s MR Vmean:     449.0 cm/s MV E velocity: 129.00 cm/s 103 cm/s MV A velocity: 37.80 cm/s  70.3 cm/s MV E/A ratio:  3.41        1.5   Recent Labs: 03/02/2019: NT-Pro BNP  10,870 08/08/2019: ALT 17; BUN 17; Creat 1.09; Hemoglobin 13.8; Magnesium 1.8; Platelets 192; Potassium 3.9; Sodium 145; TSH 2.23   Lipid Panel    Component Value Date/Time   CHOL 113 08/08/2019 1030   TRIG 100 08/08/2019 1030   HDL 40 (L) 08/08/2019 1030   CHOLHDL 2.8 08/08/2019 1030   VLDL 29 03/23/2017 1445   LDLCALC 54 08/08/2019 1030     Wt Readings from Last 3 Encounters:  09/26/19 102 lb (46.3 kg)  09/24/19 101 lb 12.8 oz (46.2 kg)  09/05/19 99 lb 9.6 oz (45.2 kg)     Other studies Reviewed: Additional studies/ records that were reviewed today include: echo images, office notes Review of the above records demonstrates: severe AS   Assessment and Plan:    1. Severe Aortic Valve Stenosis: She has severe, stage D aortic valve stenosis. I have personally reviewed the echo images. The aortic valve is thickened, calcified with limited leaflet mobility. I think she would benefit from AVR. Given advanced age, she is not a good candidate for conventional AVR by surgical approach. I think she may be a good candidate for TAVR.   STS Risk Score: Risk of Mortality: 5.323% Renal Failure: 3.515% Permanent Stroke: 2.505% Prolonged Ventilation: 13.586% DSW Infection: 0.054% Reoperation: 5.742% Morbidity or Mortality: 20.662% Short Length of Stay: 18.140% Long Length of Stay: 11.069%   I have reviewed the natural history of aortic stenosis with the patient and their family members  who are present today. We have discussed the limitations of medical therapy and the poor prognosis associated with symptomatic aortic stenosis. We have reviewed potential treatment options, including palliative medical therapy, conventional surgical aortic valve replacement, and transcatheter aortic valve replacement. We discussed treatment options in the context of the patient's specific comorbid medical conditions.   She would like to proceed with planning for TAVR. She will have a right and left heart catheterization at [facility] 10/09/19 with Dr. [name]. Risks and benefits of the cath procedure and the valve procedure are reviewed with the patient. After the cath, she will have a cardiac CT, CTA of the chest/abdomen and pelvis, carotid artery dopplers, PT assessment and will then be referred to see one of the CT surgeons on our TAVR team.      Current medicines are reviewed at length with the patient today.  The patient does not have concerns regarding medicines.  The following changes have been made:  no change  Labs/ tests ordered today include:  No orders of the defined types were placed in this encounter.    Disposition:   FU with the valve team.     Signed, [name], MD 09/26/2019 4:08 PM    [facility] [place], [place], [place]  [address] Phone: [phone]; Fax: [phone]

## 2019-09-26 NOTE — Patient Instructions (Addendum)
Medication Instructions:  No changes today *If you need a refill on your cardiac medications before your next appointment, please call your pharmacy*  Lab Work: Today: cbc, bmet --pre cath labs (10/09/19) If you have labs (blood work) drawn today and your tests are completely normal, you will receive your results only by: [name] MyChart Message (if you have MyChart) OR . A paper copy in the mail If you have any lab test that is abnormal or we need to change your treatment, we will call you to review the results.  Testing/Procedures: Left and right heart catheterization --already scheduled with Dr. [name].  Follow-Up: Theodosia Quay, RN Structural Nurse Navigator will contact you with the next steps/testing plans Other Instructions

## 2019-09-26 NOTE — Progress Notes (Signed)
Structural Heart Clinic Consult Note  Chief Complaint  Patient presents with  . New Patient (Initial Visit)    Severe AS   History of Present Illness: 86 yo female with history of chronic diastolic CHF, PAF, tachy-brady syndrome s/p pacemaker, HTN, HLD, gastric cancer, renal cell carcinoma s/p nephrectomy, CKD stage 3 , anemia, recent bladder cancer and severe aortic stenosis who is here today as a new consult in the structural heart clinic, referred by Dr. [name], for discussion regarding her aortic stenosis and possible TAVR. She has been followed for moderate aortic stenosis. Echo 09/17/19 with LVEF=60-65%, moderate LVH. Mild MR. The aortic valve leaflets are thickened and calcified with limited leaflet mobility. Mean gradient 25 mmHg, peak gradient 45 mmHg, AVA 0.6 cm2, dimensionless index 0.21. This is consistent with severe, paradoxical, low flow, low gradient AS. Moderate AI. She is a Sales promotion account executive witness. She has had progressive dyspnea and fatigue with some fluid retention. No chest pain. No syncope.   She sees the dentist regularly and has no active dental issues. She and her daughter live together in [place]. She is retired from [employer].   Primary Care Physician: [name], MD Primary Cardiologist: [name] Referring Cardiologist: [name]  Past Medical History:  Diagnosis Date  . Adenocarcinoma of stomach ([place]) 11/11/2015   gastric mass on egd  . Anal fissure   . Anemia    hx 3/17 iron infusion, on aranesp and injected B12 since 11/2015.   [name] Anxiety   . Aortic root dilatation ([place])   . Aortic stenosis   . Arthritis    oa  . 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 CAD (coronary artery disease)   . Chronic kidney disease    only has one kidney rt lft rem 03 ca     dr. Risa Grill, bladder cancer 05-04-2018  . Elevated LFTs    hepatic steatosis, none recent  . Gastroparesis   . GERD (gastroesophageal reflux disease)   . History of uterine cancer 1980s   treated with  hysterectomy, external radiation and radiation seed implants.   [name] HTN (hypertension)   . Hyperlipemia   . Hypothyroidism   . Iron deficiency anemia due to chronic blood loss 11/24/2015  . LVH (left ventricular hypertrophy) 11/14/2015   Mild, noted on ECHO  . Moderate aortic stenosis   . Neuropathy    right leg  . No blood products 02/27/2016  . Osteomyelitis ([place]) as child  . PAF (paroxysmal atrial fibrillation) ([place])   . PONV (postoperative nausea and vomiting)    long time ago  . Presence of permanent cardiac pacemaker   . Radiation proctitis   . Restless leg syndrome   . Stroke ([place]) 15   partial stroke left legally blind; left eye   . Tachycardia-bradycardia syndrome ([place])    s/p PPM by Dr Doreatha Lew (MDT) 07/03/10    Past Surgical History:  Procedure Laterality Date  . CARDIAC CATHETERIZATION  2008  . CHOLECYSTECTOMY  1970s  . COLONOSCOPY N/A 11/11/2015   Procedure: COLONOSCOPY;  Surgeon: [name], MD;  [facility];  Service: Endoscopy;  Laterality: N/A;  . CYSTOSCOPY W/ RETROGRADES Right 05/01/2018   Procedure: CYSTOSCOPY WITH RIGHT RETROGRADE PYELOGRAM;  Surgeon: Lucas Mallow, MD;  [facility];  Service: Urology;  Laterality: Right;  . CYSTOSCOPY W/ RETROGRADES Right 07/21/2018   Procedure: CYSTOSCOPY WITH RIGHT RETROGRADE PYELOGRAM;  Surgeon: Alexis Frock, MD;  [facility];  Service: Urology;  Laterality: Right;  .  ESOPHAGOGASTRODUODENOSCOPY N/A 11/11/2015   Procedure: ESOPHAGOGASTRODUODENOSCOPY (EGD);  Surgeon: [name], MD;  Location: [facility];  Service: Endoscopy;  Laterality: N/A;  . ESOPHAGOGASTRODUODENOSCOPY N/A 02/05/2016   Procedure: ESOPHAGOGASTRODUODENOSCOPY (EGD);  Surgeon: Ladene Artist, MD;  [facility];  Service: Endoscopy;  Laterality: N/A;  . ESOPHAGOGASTRODUODENOSCOPY N/A 04/02/2016   Procedure: ESOPHAGOGASTRODUODENOSCOPY (EGD);  Surgeon: [name], MD;  Location: [facility];  Service: Endoscopy;   Laterality: N/A;  . ESOPHAGOGASTRODUODENOSCOPY (EGD) WITH PROPOFOL N/A 04/06/2017   Procedure: ESOPHAGOGASTRODUODENOSCOPY (EGD) WITH PROPOFOL;  Surgeon: [name], MD;  [facility];  Service: Endoscopy;  Laterality: N/A;  . GASTRECTOMY N/A 01/15/2016   Procedure: OPEN PARTIAL GASTRECTOMY;  Surgeon: Stark Klein, MD;  Location: [facility];  Service: General;  Laterality: N/A;  . GASTRECTOMY Left 02/24/2016   Procedure: OPEN J TUBE;  Surgeon: Stark Klein, MD;  Location: [facility];  Service: General;  Laterality: Left;  [name] GASTROJEJUNOSTOMY N/A 05/18/2016   Procedure: ROUX EN [place];  Surgeon: Stark Klein, MD;  Location: [facility];  Service: General;  Laterality: N/A;  . I & D KNEE WITH POLY EXCHANGE Right 12/17/2013   Procedure: IRRIGATION AND DEBRIDEMEN RIGHT TOTAL  KNEE WITH POLY EXCHANGE;  Surgeon: Mauri Pole, MD;  [facility];  Service: Orthopedics;  Laterality: Right;  . IR GENERIC HISTORICAL  06/17/2016   IR RADIOLOGIST EVAL & MGMT 06/17/2016 Ascencion Dike, PA-C [facility] INTERV RAD  . LAPAROSCOPY N/A 01/15/2016   Procedure: LAPAROSCOPY DIAGNOSTIC;  Surgeon: Stark Klein, MD;  Location: [facility];  Service: General;  Laterality: N/A;  . LYSIS OF ADHESION  05/18/2016   Procedure: LYSIS OF ADHESION;  Surgeon: Stark Klein, MD;  Location: [facility];  Service: General;;  . NEPHRECTOMY Left    renal cell cancer  . PACEMAKER INSERTION  07/04/2011   MDT PPM implanted by Dr Doreatha Lew for tachy/brady; managed by Dr Thompson Grayer   . PATELLAR TENDON REPAIR Right 03/06/2014   Procedure: AVULSION WITH PATELLA TENDON REPAIR;  Surgeon: Mauri Pole, MD;  [facility];  Service: Orthopedics;  Laterality: Right;  . PATENT DUCTUS ARTERIOUS REPAIR  ~ 1949  . TOTAL ABDOMINAL HYSTERECTOMY  85  . TOTAL HIP REVISION Right 2009   initial replacment surg  . TOTAL KNEE ARTHROPLASTY  02/07/2012   Procedure: TOTAL KNEE ARTHROPLASTY;  Surgeon: Mauri Pole, MD;  [facility];  Service: Orthopedics;   Laterality: Right;  . TOTAL KNEE REVISION Right 07/29/2014   Procedure: RIGHT KNEE REVISION OF PREVIOUS REPAIR EXTENSOR MECHANISM;  Surgeon: Mauri Pole, MD;  [facility];  Service: Orthopedics;  Laterality: Right;  . TRANSURETHRAL RESECTION OF BLADDER TUMOR N/A 07/21/2018   Procedure: TRANSURETHRAL RESECTION OF BLADDER TUMOR (TURBT);  Surgeon: Alexis Frock, MD;  [facility];  Service: Urology;  Laterality: N/A;  . TRANSURETHRAL RESECTION OF BLADDER TUMOR WITH MITOMYCIN-C N/A 05/01/2018   Procedure: TRANSURETHRAL RESECTION OF BLADDER TUMOR WITH POST OP INSTILLATION OF GEMCITABINE;  Surgeon: Lucas Mallow, MD;  [facility];  Service: Urology;  Laterality: N/A;  . WHIPPLE PROCEDURE N/A 05/18/2016   Procedure: EXPLORATORY LAPAROTOMY;  Surgeon: Stark Klein, MD;  Location: [facility];  Service: General;  Laterality: N/A;    Current Outpatient Medications  Medication Sig Dispense Refill  . acetaminophen (TYLENOL) 500 MG tablet Take 500-1,000 mg by mouth every 6 (six) hours as needed for mild pain or headache.     . ALPRAZolam (XANAX) 0.5 MG tablet Take 0.5  mg by mouth as needed for anxiety.    . Alum & Mag Hydroxide-Simeth (MYLANTA PO) Take 20 mLs by mouth daily.    . Ascorbic Acid (VITAMIN C) 1000 MG tablet Take 1,000 mg by mouth daily.    . Biotin 10 MG CAPS Take 10 mg by mouth daily.     . cetirizine (ZYRTEC) 10 MG tablet Take 10 mg by mouth daily.    . Cholecalciferol (VITAMIN D-3) 5000 units TABS Take 5,000 Units by mouth 3 (three) times daily.     . famotidine (PEPCID) 40 MG tablet Take 1 tablet Daily for Indigestion & Heartburn 90 tablet 3  . furosemide (LASIX) 20 MG tablet Take 20 mg by mouth as needed.    . gabapentin (NEURONTIN) 300 MG capsule Take 1 capsule by mouth 2 (two) times a day.    . IRON, FERROUS SULFATE, PO Take 325 mg by mouth daily.     [name] levothyroxine (SYNTHROID) 100 MCG tablet Take 1/2 tablet daily for thyroid. 45 tablet 1  . loperamide (IMODIUM A-D) 2  MG tablet Take up to 12 tablets /day as needed for  for Diarrhea 120 tablet 11  . loratadine (CLARITIN) 10 MG tablet Take 10 mg by mouth daily.    [name] losartan (COZAAR) 25 MG tablet Take 2 tablets (50 mg total) by mouth daily. 360 tablet 1  . metoprolol tartrate (LOPRESSOR) 100 MG tablet Take 1 tablet 2 x /day for BP 30 tablet 6  . nitroGLYCERIN (NITROSTAT) 0.4 MG SL tablet Dissolve 1 tablet under tongue every 5 minutes if needed for Angina 50 tablet prn  . ondansetron (ZOFRAN-ODT) 8 MG disintegrating tablet Take 8 mg by mouth every 8 (eight) hours as needed for nausea or vomiting.     . potassium chloride (KLOR-CON) 10 MEQ tablet Take 1 tablet (10 mEq total) by mouth daily. 90 tablet 1  . simethicone (MYLICON) 80 MG chewable tablet Chew 80 mg by mouth every 6 (six) hours as needed for flatulence.    . sucralfate (CARAFATE) 1 g tablet TAKE 1 TABLET (1 G TOTAL) BY MOUTH 4 (FOUR) TIMES DAILY - WITH MEALS AND AT BEDTIME. (Patient taking differently: Take 1 g by mouth 3 (three) times daily. ) 360 tablet 3  . triamcinolone cream (KENALOG) 0.1 % Apply 1 application topically as needed.    . verapamil (CALAN-SR) 120 MG CR tablet Take 1 tablet 2 x /day with a meal for BP & Heart Rhythm 180 tablet 3  . warfarin (COUMADIN) 2 MG tablet TAKE 1 TO 2 TABLETS AS DIRECTED TO PREVENT BLOOD CLOTS 180 tablet 1   No current facility-administered medications for this visit.    Allergies  Allergen Reactions  . Adhesive [Tape] Other (See Comments)    Tears skin, Please use "paper" tape  . Amiodarone Other (See Comments)     Thyroid and liver and kidney problems  . Hydrocodone Other (See Comments)    HALLLUCINATIONS  . Remeron [Mirtazapine] Other (See Comments)    Hallucinations and make pt loopy  . Codeine Nausea And Vomiting  . Morphine And Related Nausea And Vomiting  . Reglan [Metoclopramide] Hives, Itching and Rash  . Requip [Ropinirole Hcl] Other (See Comments)    Headache   . Zinc Nausea Only  . Other      NO BLOOD PRODUCTS    Social History   Socioeconomic History  . Marital status: Widowed    Spouse name: Not on file  . Number of children: 3  .  Years of education: Not on file  . Highest education level: Not on file  Occupational History  . Not on file  Tobacco Use  . Smoking status: Never Smoker  . Smokeless tobacco: Never Used  Substance and Sexual Activity  . Alcohol use: No  . Drug use: No  . Sexual activity: Never  Other Topics Concern  . Not on file  Social History Narrative   01/01/19 Lives in [place] w/dgtr, [name].  Continues to work.   Social Determinants of Health   Financial Resource Strain:   . Difficulty of Paying Living Expenses: Not on file  Food Insecurity:   . Worried About Charity fundraiser in the Last Year: Not on file  . Ran Out of Food in the Last Year: Not on file  Transportation Needs:   . Lack of Transportation (Medical): Not on file  . Lack of Transportation (Non-Medical): Not on file  Physical Activity:   . Days of Exercise per Week: Not on file  . Minutes of Exercise per Session: Not on file  Stress:   . Feeling of Stress : Not on file  Social Connections:   . Frequency of Communication with Friends and Family: Not on file  . Frequency of Social Gatherings with Friends and Family: Not on file  . Attends Religious Services: Not on file  . Active Member of Clubs or Organizations: Not on file  . Attends Archivist Meetings: Not on file  . Marital Status: Not on file  Intimate Partner Violence:   . Fear of Current or Ex-Partner: Not on file  . Emotionally Abused: Not on file  . Physically Abused: Not on file  . Sexually Abused: Not on file    Family History  Problem Relation Age of Onset  . Colon cancer Mother 5  . Heart disease Mother   . Heart disease Father   . Heart attack Father   . Heart disease Maternal Grandmother     Review of Systems:  As stated in the HPI and otherwise negative.   BP (!) 142/70  (BP Location: Left Arm, Patient Position: Sitting, Cuff Size: Normal)   Pulse 70   Ht 4\' 11"  (1.499 m)   Wt 102 lb (46.3 kg)   SpO2 98%   BMI 20.60 kg/m   Physical Examination: General: Well developed, well nourished, NAD  HEENT: OP clear, mucus membranes moist  SKIN: warm, dry. No rashes. Neuro: No focal deficits  Musculoskeletal: Muscle strength 5/5 all ext  Psychiatric: Mood and affect normal  Neck: No JVD, no carotid bruits, no thyromegaly, no lymphadenopathy.  Lungs:Clear bilaterally, no wheezes, rhonci, crackles Cardiovascular: Regular rate and rhythm. Loud, harsh, late peaking systolic murmur.  Abdomen:Soft. Bowel sounds present. Non-tender.  Extremities: No lower extremity edema. Pulses are 2 + in the bilateral DP/PT.  EKG:  EKG is not ordered today. The ekg ordered today demonstrates   Echo 09/17/19:   1. Left ventricular ejection fraction, by visual estimation, is 60 to 65%. The left ventricle has normal function. There is moderately increased left ventricular hypertrophy.  2. Left ventricular diastolic function could not be evaluated.  3. Small left ventricular internal cavity size.  4. The left ventricle has no regional wall motion abnormalities.  5. Global right ventricle has normal systolic function.The right ventricular size is normal. No increase in right ventricular wall thickness.  6. Left atrial size was moderately dilated.  7. The mitral valve is degenerative. Mild mitral valve regurgitation.  8.  The tricuspid valve is grossly normal.  9. The aortic valve is severely calcified with restricted leaflet motion. Vmax 3.3 m/s, mean gradient 25 mmHG, DI 0.21, AVA 0.60 cm2. SV=43 cc and SVi=31 cc/m2. Suspect there is paradoxical low-flow low-gradient severe aortic stenosis. Moderate aortic  regurgitation is present. Recommend an aortic valve calcium score for clarification of AoV stenosis severity. 10. Aortic valve regurgitation is moderate. 11. The aortic valve is  tricuspid. Aortic valve regurgitation is moderate. Moderate to severe aortic valve stenosis. 12. Mildly elevated pulmonary artery systolic pressure. 13. The tricuspid regurgitant velocity is 2.68 m/s, and with an assumed right atrial pressure of 15 mmHg, the estimated right ventricular systolic pressure is mildly elevated at 43.8 mmHg. 14. A pacer wire is visualized in the RA and RV. 15. The inferior vena cava is dilated in size with <50% respiratory variability, suggesting right atrial pressure of 15 mmHg.  In comparison to the previous echocardiogram(s): EF unchanged. Concerns for paradoxical low-flow low-gradient severe AS on this study. Gradients are higher compared with prior study (11/14/2015). FINDINGS  Left Ventricle: Left ventricular ejection fraction, by visual estimation, is 60 to 65%. The left ventricle has normal function. The left ventricle has no regional wall motion abnormalities. The left ventricular internal cavity size was the LV cavity  size is small. There is moderately increased left ventricular hypertrophy. Concentric left ventricular hypertrophy. The left ventricular diastology could not be evaluated due to atrial fibrillation. Left ventricular diastolic function could not be  evaluated.  Right Ventricle: The right ventricular size is normal. No increase in right ventricular wall thickness. Global RV systolic function is has normal systolic function. The tricuspid regurgitant velocity is 2.68 m/s, and with an assumed right atrial pressure  of 15 mmHg, the estimated right ventricular systolic pressure is mildly elevated at 43.8 mmHg.  Left Atrium: Left atrial size was moderately dilated.  Right Atrium: Right atrial size was mild-moderately dilated  Pericardium: There is no evidence of pericardial effusion.  Mitral Valve: The mitral valve is degenerative in appearance. Moderate mitral annular calcification. Mild mitral valve regurgitation. MV peak gradient, 8.1  mmHg.  Tricuspid Valve: The tricuspid valve is grossly normal. Tricuspid valve regurgitation is mild.  Aortic Valve: The aortic valve is tricuspid. Aortic valve regurgitation is moderate. Aortic regurgitation PHT measures 290 msec. Moderate to severe aortic stenosis is present. Moderate to severe aortic valve annular calcification. Aortic valve mean  gradient measures 25.2 mmHg. Aortic valve peak gradient measures 44.2 mmHg. Aortic valve area, by VTI measures 0.60 cm. The aortic valve is severely calcified with restricted leaflet motion. Vmax 3.3 m/s, mean gradient 25 mmHG, DI 0.21, AVA 0.60 cm2.  SV=43 cc and SVi=31 cc/m2. Suspect there is paradoxical low-flow low-gradient severe aortic stenosis. Moderate aortic regurgitation is present. Recommend an aortic valve calcium score for clarification of AoV stenosis severity.  Pulmonic Valve: The pulmonic valve was grossly normal. Pulmonic valve regurgitation is mild. Pulmonic regurgitation is mild.  Aorta: The aortic root and ascending aorta are structurally normal, with no evidence of dilitation.  Venous: The inferior vena cava is dilated in size with less than 50% respiratory variability, suggesting right atrial pressure of 15 mmHg.  IAS/Shunts: No atrial level shunt detected by color flow Doppler.  Additional Comments: A pacer wire is visualized in the right atrium and right ventricle.   LEFT VENTRICLE PLAX 2D LVIDd:         3.20 cm  Diastology LVIDs:         1.97 cm  LV e' lateral:   8.81 cm/s LV PW:         1.29 cm  LV E/e' lateral: 14.6 LV IVS:        1.20 cm  LV e' medial:    5.66 cm/s LVOT diam:     1.90 cm  LV E/e' medial:  22.8 LV SV:         29 ml LV SV Index:   20.81 LVOT Area:     2.84 cm    RIGHT VENTRICLE RV S prime:     9.46 cm/s TAPSE (M-mode): 1.6 cm  LEFT ATRIUM             Index       RIGHT ATRIUM           Index LA diam:        3.75 cm 2.73 cm/m  RA Area:     20.80 cm LA Vol (A2C):   64.3 ml 46.89  ml/m RA Volume:   65.40 ml  47.69 ml/m LA Vol (A4C):   59.7 ml 43.53 ml/m LA Biplane Vol: 67.7 ml 49.37 ml/m  AORTIC VALVE AV Area (Vmax):    0.66 cm AV Area (Vmean):   0.64 cm AV Area (VTI):     0.60 cm AV Vmax:           332.42 cm/s AV Vmean:          233.799 cm/s AV VTI:            0.719 m AV Peak Grad:      44.2 mmHg AV Mean Grad:      25.2 mmHg LVOT Vmax:         76.97 cm/s LVOT Vmean:        52.486 cm/s LVOT VTI:          0.152 m LVOT/AV VTI ratio: 0.21 AI PHT:            290 msec   AORTA Ao Root diam: 3.65 cm Ao Asc diam:  4.00 cm  MITRAL VALVE                         TRICUSPID VALVE MV Area (PHT): 3.39 cm              TR Peak grad:   28.8 mmHg MV Peak grad:  8.1 mmHg              TR Vmax:        296.00 cm/s MV Mean grad:  2.0 mmHg MV Vmax:       1.42 m/s              SHUNTS MV Vmean:      61.6 cm/s             Systemic VTI:  0.15 m MV VTI:        0.28 m                Systemic Diam: 1.90 cm MV PHT:        64.96 msec MV Decel Time: 224 msec MR Peak grad: 145.4 mmHg MR Mean grad: 89.0 mmHg MR Vmax:      603.00 cm/s MR Vmean:     449.0 cm/s MV E velocity: 129.00 cm/s 103 cm/s MV A velocity: 37.80 cm/s  70.3 cm/s MV E/A ratio:  3.41        1.5   Recent Labs: 03/02/2019: NT-Pro BNP  10,870 08/08/2019: ALT 17; BUN 17; Creat 1.09; Hemoglobin 13.8; Magnesium 1.8; Platelets 192; Potassium 3.9; Sodium 145; TSH 2.23   Lipid Panel    Component Value Date/Time   CHOL 113 08/08/2019 1030   TRIG 100 08/08/2019 1030   HDL 40 (L) 08/08/2019 1030   CHOLHDL 2.8 08/08/2019 1030   VLDL 29 03/23/2017 1445   LDLCALC 54 08/08/2019 1030     Wt Readings from Last 3 Encounters:  09/26/19 102 lb (46.3 kg)  09/24/19 101 lb 12.8 oz (46.2 kg)  09/05/19 99 lb 9.6 oz (45.2 kg)     Other studies Reviewed: Additional studies/ records that were reviewed today include: echo images, office notes Review of the above records demonstrates: severe AS   Assessment and Plan:    1. Severe Aortic Valve Stenosis: She has severe, stage D aortic valve stenosis. I have personally reviewed the echo images. The aortic valve is thickened, calcified with limited leaflet mobility. I think she would benefit from AVR. Given advanced age, she is not a good candidate for conventional AVR by surgical approach. I think she may be a good candidate for TAVR.   STS Risk Score: Risk of Mortality: 5.323% Renal Failure: 3.515% Permanent Stroke: 2.505% Prolonged Ventilation: 13.586% DSW Infection: 0.054% Reoperation: 5.742% Morbidity or Mortality: 20.662% Short Length of Stay: 18.140% Long Length of Stay: 11.069%   I have reviewed the natural history of aortic stenosis with the patient and their family members  who are present today. We have discussed the limitations of medical therapy and the poor prognosis associated with symptomatic aortic stenosis. We have reviewed potential treatment options, including palliative medical therapy, conventional surgical aortic valve replacement, and transcatheter aortic valve replacement. We discussed treatment options in the context of the patient's specific comorbid medical conditions.   She would like to proceed with planning for TAVR. She will have a right and left heart catheterization at [facility] 10/09/19 with Dr. [name]. Risks and benefits of the cath procedure and the valve procedure are reviewed with the patient. After the cath, she will have a cardiac CT, CTA of the chest/abdomen and pelvis, carotid artery dopplers, PT assessment and will then be referred to see one of the CT surgeons on our TAVR team.      Current medicines are reviewed at length with the patient today.  The patient does not have concerns regarding medicines.  The following changes have been made:  no change  Labs/ tests ordered today include:  No orders of the defined types were placed in this encounter.    Disposition:   FU with the valve team.     Signed, [name], MD 09/26/2019 4:08 PM    [facility] [place], [place], [place]  [address] Phone: [phone]; Fax: [phone]

## 2019-09-27 LAB — CBC
Hematocrit: 41.7 % (ref 34.0–46.6)
Hemoglobin: 13.6 g/dL (ref 11.1–15.9)
MCH: 31.8 pg (ref 26.6–33.0)
MCHC: 32.6 g/dL (ref 31.5–35.7)
MCV: 97 fL (ref 79–97)
Platelets: 195 10*3/uL (ref 150–450)
RBC: 4.28 x10E6/uL (ref 3.77–5.28)
RDW: 12.9 % (ref 11.7–15.4)
WBC: 6.5 10*3/uL (ref 3.4–10.8)

## 2019-09-27 LAB — BASIC METABOLIC PANEL
BUN/Creatinine Ratio: 16 (ref 12–28)
BUN: 16 mg/dL (ref 8–27)
CO2: 22 mmol/L (ref 20–29)
Calcium: 8.6 mg/dL — ABNORMAL LOW (ref 8.7–10.3)
Chloride: 108 mmol/L — ABNORMAL HIGH (ref 96–106)
Creatinine, Ser: 1.01 mg/dL — ABNORMAL HIGH (ref 0.57–1.00)
GFR calc Af Amer: 59 mL/min/{1.73_m2} — ABNORMAL LOW (ref >59)
GFR calc non Af Amer: 52 mL/min/{1.73_m2} — ABNORMAL LOW (ref >59)
Glucose: 90 mg/dL (ref 65–99)
Potassium: 3.7 mmol/L (ref 3.5–5.2)
Sodium: 144 mmol/L (ref 134–144)

## 2019-09-28 ENCOUNTER — Ambulatory Visit (INDEPENDENT_AMBULATORY_CARE_PROVIDER_SITE_OTHER): Payer: Medicare Other | Admitting: *Deleted

## 2019-09-28 DIAGNOSIS — E875 Hyperkalemia: Secondary | ICD-10-CM | POA: Diagnosis not present

## 2019-09-28 DIAGNOSIS — Z5181 Encounter for therapeutic drug level monitoring: Secondary | ICD-10-CM | POA: Diagnosis not present

## 2019-09-28 DIAGNOSIS — I480 Paroxysmal atrial fibrillation: Secondary | ICD-10-CM | POA: Diagnosis not present

## 2019-09-28 DIAGNOSIS — I272 Pulmonary hypertension, unspecified: Secondary | ICD-10-CM | POA: Diagnosis not present

## 2019-09-28 DIAGNOSIS — I083 Combined rheumatic disorders of mitral, aortic and tricuspid valves: Secondary | ICD-10-CM | POA: Diagnosis not present

## 2019-09-28 DIAGNOSIS — I5033 Acute on chronic diastolic (congestive) heart failure: Secondary | ICD-10-CM | POA: Diagnosis not present

## 2019-09-28 DIAGNOSIS — Z85038 Personal history of other malignant neoplasm of large intestine: Secondary | ICD-10-CM | POA: Diagnosis not present

## 2019-09-28 DIAGNOSIS — K3184 Gastroparesis: Secondary | ICD-10-CM | POA: Diagnosis not present

## 2019-09-28 DIAGNOSIS — E559 Vitamin D deficiency, unspecified: Secondary | ICD-10-CM | POA: Diagnosis not present

## 2019-09-28 DIAGNOSIS — E039 Hypothyroidism, unspecified: Secondary | ICD-10-CM | POA: Diagnosis not present

## 2019-09-28 DIAGNOSIS — Z7901 Long term (current) use of anticoagulants: Secondary | ICD-10-CM | POA: Diagnosis not present

## 2019-09-28 DIAGNOSIS — Z85028 Personal history of other malignant neoplasm of stomach: Secondary | ICD-10-CM | POA: Diagnosis not present

## 2019-09-28 DIAGNOSIS — H5462 Unqualified visual loss, left eye, normal vision right eye: Secondary | ICD-10-CM | POA: Diagnosis not present

## 2019-09-28 DIAGNOSIS — I495 Sick sinus syndrome: Secondary | ICD-10-CM | POA: Diagnosis not present

## 2019-09-28 DIAGNOSIS — G5791 Unspecified mononeuropathy of right lower limb: Secondary | ICD-10-CM | POA: Diagnosis not present

## 2019-09-28 DIAGNOSIS — Z8553 Personal history of malignant neoplasm of renal pelvis: Secondary | ICD-10-CM | POA: Diagnosis not present

## 2019-09-28 DIAGNOSIS — G2581 Restless legs syndrome: Secondary | ICD-10-CM | POA: Diagnosis not present

## 2019-09-28 DIAGNOSIS — Z950 Presence of cardiac pacemaker: Secondary | ICD-10-CM | POA: Diagnosis not present

## 2019-09-28 DIAGNOSIS — J301 Allergic rhinitis due to pollen: Secondary | ICD-10-CM | POA: Diagnosis not present

## 2019-09-28 DIAGNOSIS — N183 Chronic kidney disease, stage 3 unspecified: Secondary | ICD-10-CM | POA: Diagnosis not present

## 2019-09-28 DIAGNOSIS — K219 Gastro-esophageal reflux disease without esophagitis: Secondary | ICD-10-CM | POA: Diagnosis not present

## 2019-09-28 DIAGNOSIS — I130 Hypertensive heart and chronic kidney disease with heart failure and stage 1 through stage 4 chronic kidney disease, or unspecified chronic kidney disease: Secondary | ICD-10-CM | POA: Diagnosis not present

## 2019-09-28 DIAGNOSIS — I2510 Atherosclerotic heart disease of native coronary artery without angina pectoris: Secondary | ICD-10-CM | POA: Diagnosis not present

## 2019-09-28 DIAGNOSIS — H6523 Chronic serous otitis media, bilateral: Secondary | ICD-10-CM | POA: Diagnosis not present

## 2019-09-28 LAB — POCT INR: INR: 2.6 (ref 2.0–3.0)

## 2019-09-28 NOTE — Patient Instructions (Signed)
Description   Spoke with [facility] and instructed pt to continue taking 2mg  daily. Recheck INR on Tuesday and give instructions for bridge. Call us with any medication changes or concerns # [phone].

## 2019-10-02 ENCOUNTER — Ambulatory Visit (INDEPENDENT_AMBULATORY_CARE_PROVIDER_SITE_OTHER): Payer: Medicare Other | Admitting: *Deleted

## 2019-10-02 DIAGNOSIS — I5033 Acute on chronic diastolic (congestive) heart failure: Secondary | ICD-10-CM | POA: Diagnosis not present

## 2019-10-02 DIAGNOSIS — H5462 Unqualified visual loss, left eye, normal vision right eye: Secondary | ICD-10-CM | POA: Diagnosis not present

## 2019-10-02 DIAGNOSIS — Z950 Presence of cardiac pacemaker: Secondary | ICD-10-CM | POA: Diagnosis not present

## 2019-10-02 DIAGNOSIS — E039 Hypothyroidism, unspecified: Secondary | ICD-10-CM | POA: Diagnosis not present

## 2019-10-02 DIAGNOSIS — I2510 Atherosclerotic heart disease of native coronary artery without angina pectoris: Secondary | ICD-10-CM | POA: Diagnosis not present

## 2019-10-02 DIAGNOSIS — K219 Gastro-esophageal reflux disease without esophagitis: Secondary | ICD-10-CM | POA: Diagnosis not present

## 2019-10-02 DIAGNOSIS — G5791 Unspecified mononeuropathy of right lower limb: Secondary | ICD-10-CM | POA: Diagnosis not present

## 2019-10-02 DIAGNOSIS — E875 Hyperkalemia: Secondary | ICD-10-CM | POA: Diagnosis not present

## 2019-10-02 DIAGNOSIS — Z5181 Encounter for therapeutic drug level monitoring: Secondary | ICD-10-CM | POA: Diagnosis not present

## 2019-10-02 DIAGNOSIS — I130 Hypertensive heart and chronic kidney disease with heart failure and stage 1 through stage 4 chronic kidney disease, or unspecified chronic kidney disease: Secondary | ICD-10-CM | POA: Diagnosis not present

## 2019-10-02 DIAGNOSIS — G2581 Restless legs syndrome: Secondary | ICD-10-CM | POA: Diagnosis not present

## 2019-10-02 DIAGNOSIS — Z85028 Personal history of other malignant neoplasm of stomach: Secondary | ICD-10-CM | POA: Diagnosis not present

## 2019-10-02 DIAGNOSIS — Z7901 Long term (current) use of anticoagulants: Secondary | ICD-10-CM

## 2019-10-02 DIAGNOSIS — I480 Paroxysmal atrial fibrillation: Secondary | ICD-10-CM

## 2019-10-02 DIAGNOSIS — H6523 Chronic serous otitis media, bilateral: Secondary | ICD-10-CM | POA: Diagnosis not present

## 2019-10-02 DIAGNOSIS — I272 Pulmonary hypertension, unspecified: Secondary | ICD-10-CM | POA: Diagnosis not present

## 2019-10-02 DIAGNOSIS — N183 Chronic kidney disease, stage 3 unspecified: Secondary | ICD-10-CM | POA: Diagnosis not present

## 2019-10-02 DIAGNOSIS — Z8553 Personal history of malignant neoplasm of renal pelvis: Secondary | ICD-10-CM | POA: Diagnosis not present

## 2019-10-02 DIAGNOSIS — Z85038 Personal history of other malignant neoplasm of large intestine: Secondary | ICD-10-CM | POA: Diagnosis not present

## 2019-10-02 DIAGNOSIS — E559 Vitamin D deficiency, unspecified: Secondary | ICD-10-CM | POA: Diagnosis not present

## 2019-10-02 DIAGNOSIS — I495 Sick sinus syndrome: Secondary | ICD-10-CM | POA: Diagnosis not present

## 2019-10-02 DIAGNOSIS — J301 Allergic rhinitis due to pollen: Secondary | ICD-10-CM | POA: Diagnosis not present

## 2019-10-02 DIAGNOSIS — I083 Combined rheumatic disorders of mitral, aortic and tricuspid valves: Secondary | ICD-10-CM | POA: Diagnosis not present

## 2019-10-02 DIAGNOSIS — K3184 Gastroparesis: Secondary | ICD-10-CM | POA: Diagnosis not present

## 2019-10-02 LAB — POCT INR: INR: 2.9 (ref 2.0–3.0)

## 2019-10-02 MED ORDER — ENOXAPARIN SODIUM 60 MG/0.6ML ~~LOC~~ SOLN: 60.0000 mg | SUBCUTANEOUS | 1 refills | Status: DC

## 2019-10-02 NOTE — Patient Instructions (Addendum)
Description   Spoke with [facility] and instructed pt to continue taking 2mg  daily. Please refer to instructions for Lovenox Bridge. Recheck INR in 1 week post procedure. Call us with any medication changes or concerns # [phone].      10/03/2019: Last dose of Coumadin.  10/04/2019: No Coumadin or Lovenox.  10/05/2019: Inject Lovenox 60mg  in the fatty abdominal tissue at least 2 inches from the belly button at 8pm and rotate sites. No Coumadin.  10/06/2019: Inject Lovenox in the fatty tissue at 8pm. No Coumadin.  10/07/2019: Inject Lovenox in the fatty tissue at 8pm. No Coumadin.  10/08/2019: No Lovenox and No Coumadin.  10/09/2019: Procedure Day - No Lovenox - Resume Coumadin in the evening or as directed by doctor (take an extra half tablet with usual dose).  10/10/2019: Resume Lovenox inject in the fatty tissue at 9am and take Coumadin (take an extra half tablet with usual dose).  10/11/2019: Inject Lovenox in the fatty tissue at 9am and take Coumadin.  10/12/2019: Inject Lovenox in the fatty tissue at 9am and take Coumadin.   10/13/2019: Inject Lovenox in the fatty tissue at 9am and take Coumadin.  10/14/2019: Inject Lovenox in the fatty tissue at 9am and take Coumadin.  10/15/2019: inject Lovenox in the fatty tissue at 9am and [place] to check INR and call to Anticoagulation Clinic.

## 2019-10-05 ENCOUNTER — Other Ambulatory Visit (HOSPITAL_COMMUNITY)
Admission: RE | Admit: 2019-10-05 | Discharge: 2019-10-05 | Disposition: A | Discharge status: Home / Self Care | Payer: Medicare Other | Source: Ambulatory Visit | Attending: Cardiology | Admitting: Cardiology

## 2019-10-05 DIAGNOSIS — Z01812 Encounter for preprocedural laboratory examination: Secondary | ICD-10-CM | POA: Insufficient documentation

## 2019-10-05 DIAGNOSIS — Z20822 Contact with and (suspected) exposure to covid-19: Secondary | ICD-10-CM | POA: Insufficient documentation

## 2019-10-05 LAB — SARS CORONAVIRUS 2 (TAT 6-24 HRS): SARS Coronavirus 2: NEGATIVE

## 2019-10-07 ENCOUNTER — Other Ambulatory Visit: Payer: Self-pay

## 2019-10-07 DIAGNOSIS — I350 Nonrheumatic aortic (valve) stenosis: Secondary | ICD-10-CM

## 2019-10-08 ENCOUNTER — Telehealth: Payer: Self-pay | Admitting: *Deleted

## 2019-10-08 NOTE — Telephone Encounter (Addendum)
Pt contacted pre-catheterization scheduled at [facility] for: Tuesday October 09, 2019 7:30 AM Verified arrival time and place: [facility]) at: 5:30 AM   No solid food after midnight prior to cath, clear liquids until 5 AM day of procedure. Contrast allergy: no  Hold: Coumadin-none 10/04/19 until post procedure. Lasix/KCl- day before and day of procedure-GFR  52  Except hold medications AM meds can be  taken pre-cath with sip of water including: ASA 81 mg   Confirmed patient has responsible adult to drive home post procedure and observe 24 hours after arriving home: yes  Currently, due to Covid-19 pandemic, only one person will be allowed with patient. Must be the same person for patient's entire stay and will be required to wear a mask. They will be asked to wait in the waiting room for the duration of the patient's stay.  Patients are required to wear a mask when they enter the hospital.      COVID-19 Pre-Screening Questions:  . In the past 7 to 10 days have you had a cough,  shortness of breath, headache, congestion, fever (100 or greater) body aches, chills, sore throat, or sudden loss of taste or sense of smell? no . Have you been around anyone with known Covid 19? no . Have you been around anyone who is awaiting Covid 19 test results in the past 7 to 10 days? no . Have you been around anyone who has been exposed to Covid 19, or has mentioned symptoms of Covid 19 within the past 7 to 10 days? no    I spoke with patient's daughter (DPR), [name], reviewed procedure/mask/visitor instructions, Covid-19 screening questions with patient, she verbalized understanding, thanked me for call.

## 2019-10-09 ENCOUNTER — Other Ambulatory Visit: Payer: Self-pay

## 2019-10-09 ENCOUNTER — Encounter (HOSPITAL_COMMUNITY)
Admission: RE | Disposition: A | Discharge status: Home / Self Care | Payer: Medicare Other | Source: Home / Self Care | Attending: Cardiology

## 2019-10-09 ENCOUNTER — Ambulatory Visit (HOSPITAL_COMMUNITY)
Admission: RE | Admit: 2019-10-09 | Discharge: 2019-10-09 | Disposition: A | Discharge status: Home / Self Care | Payer: Medicare Other | Attending: Cardiology | Admitting: Cardiology

## 2019-10-09 DIAGNOSIS — E782 Mixed hyperlipidemia: Secondary | ICD-10-CM | POA: Diagnosis present

## 2019-10-09 DIAGNOSIS — R0602 Shortness of breath: Secondary | ICD-10-CM | POA: Diagnosis not present

## 2019-10-09 DIAGNOSIS — I350 Nonrheumatic aortic (valve) stenosis: Secondary | ICD-10-CM

## 2019-10-09 DIAGNOSIS — K3184 Gastroparesis: Secondary | ICD-10-CM | POA: Insufficient documentation

## 2019-10-09 DIAGNOSIS — Z8673 Personal history of transient ischemic attack (TIA), and cerebral infarction without residual deficits: Secondary | ICD-10-CM | POA: Insufficient documentation

## 2019-10-09 DIAGNOSIS — Z79890 Hormone replacement therapy (postmenopausal): Secondary | ICD-10-CM | POA: Insufficient documentation

## 2019-10-09 DIAGNOSIS — I480 Paroxysmal atrial fibrillation: Secondary | ICD-10-CM | POA: Diagnosis not present

## 2019-10-09 DIAGNOSIS — K219 Gastro-esophageal reflux disease without esophagitis: Secondary | ICD-10-CM | POA: Diagnosis not present

## 2019-10-09 DIAGNOSIS — I5033 Acute on chronic diastolic (congestive) heart failure: Secondary | ICD-10-CM | POA: Diagnosis present

## 2019-10-09 DIAGNOSIS — G629 Polyneuropathy, unspecified: Secondary | ICD-10-CM | POA: Diagnosis not present

## 2019-10-09 DIAGNOSIS — I2510 Atherosclerotic heart disease of native coronary artery without angina pectoris: Secondary | ICD-10-CM | POA: Insufficient documentation

## 2019-10-09 DIAGNOSIS — N183 Chronic kidney disease, stage 3 unspecified: Secondary | ICD-10-CM | POA: Diagnosis not present

## 2019-10-09 DIAGNOSIS — Z8551 Personal history of malignant neoplasm of bladder: Secondary | ICD-10-CM | POA: Insufficient documentation

## 2019-10-09 DIAGNOSIS — D649 Anemia, unspecified: Secondary | ICD-10-CM | POA: Insufficient documentation

## 2019-10-09 DIAGNOSIS — I130 Hypertensive heart and chronic kidney disease with heart failure and stage 1 through stage 4 chronic kidney disease, or unspecified chronic kidney disease: Secondary | ICD-10-CM | POA: Insufficient documentation

## 2019-10-09 DIAGNOSIS — Z7901 Long term (current) use of anticoagulants: Secondary | ICD-10-CM | POA: Diagnosis not present

## 2019-10-09 DIAGNOSIS — I10 Essential (primary) hypertension: Secondary | ICD-10-CM | POA: Diagnosis present

## 2019-10-09 DIAGNOSIS — Z8542 Personal history of malignant neoplasm of other parts of uterus: Secondary | ICD-10-CM | POA: Insufficient documentation

## 2019-10-09 DIAGNOSIS — Z950 Presence of cardiac pacemaker: Secondary | ICD-10-CM | POA: Diagnosis not present

## 2019-10-09 DIAGNOSIS — G2581 Restless legs syndrome: Secondary | ICD-10-CM | POA: Diagnosis not present

## 2019-10-09 DIAGNOSIS — Z905 Acquired absence of kidney: Secondary | ICD-10-CM | POA: Diagnosis not present

## 2019-10-09 DIAGNOSIS — I5032 Chronic diastolic (congestive) heart failure: Secondary | ICD-10-CM | POA: Diagnosis not present

## 2019-10-09 DIAGNOSIS — E039 Hypothyroidism, unspecified: Secondary | ICD-10-CM | POA: Diagnosis not present

## 2019-10-09 DIAGNOSIS — F419 Anxiety disorder, unspecified: Secondary | ICD-10-CM | POA: Diagnosis not present

## 2019-10-09 DIAGNOSIS — Z85028 Personal history of other malignant neoplasm of stomach: Secondary | ICD-10-CM | POA: Insufficient documentation

## 2019-10-09 DIAGNOSIS — Z79899 Other long term (current) drug therapy: Secondary | ICD-10-CM | POA: Insufficient documentation

## 2019-10-09 DIAGNOSIS — E785 Hyperlipidemia, unspecified: Secondary | ICD-10-CM | POA: Diagnosis not present

## 2019-10-09 DIAGNOSIS — I495 Sick sinus syndrome: Secondary | ICD-10-CM | POA: Diagnosis present

## 2019-10-09 DIAGNOSIS — Z8249 Family history of ischemic heart disease and other diseases of the circulatory system: Secondary | ICD-10-CM | POA: Insufficient documentation

## 2019-10-09 HISTORY — PX: RIGHT/LEFT HEART CATH AND CORONARY ANGIOGRAPHY: CATH118266

## 2019-10-09 LAB — POCT I-STAT 7, (LYTES, BLD GAS, ICA,H+H)
Acid-base deficit: 2 mmol/L (ref 0.0–2.0)
Bicarbonate: 21.7 mmol/L (ref 20.0–28.0)
Calcium, Ion: 1.17 mmol/L (ref 1.15–1.40)
HCT: 41 % (ref 36.0–46.0)
Hemoglobin: 13.9 g/dL (ref 12.0–15.0)
O2 Saturation: 99 %
Potassium: 3.4 mmol/L — ABNORMAL LOW (ref 3.5–5.1)
Sodium: 146 mmol/L — ABNORMAL HIGH (ref 135–145)
TCO2: 23 mmol/L (ref 22–32)
pCO2 arterial: 33.2 mmHg (ref 32.0–48.0)
pH, Arterial: 7.423 (ref 7.350–7.450)
pO2, Arterial: 116 mmHg — ABNORMAL HIGH (ref 83.0–108.0)

## 2019-10-09 LAB — POCT I-STAT EG7
Acid-base deficit: 2 mmol/L (ref 0.0–2.0)
Bicarbonate: 23.8 mmol/L (ref 20.0–28.0)
Calcium, Ion: 1.19 mmol/L (ref 1.15–1.40)
HCT: 40 % (ref 36.0–46.0)
Hemoglobin: 13.6 g/dL (ref 12.0–15.0)
O2 Saturation: 65 %
Potassium: 3.4 mmol/L — ABNORMAL LOW (ref 3.5–5.1)
Sodium: 145 mmol/L (ref 135–145)
TCO2: 25 mmol/L (ref 22–32)
pCO2, Ven: 43.9 mmHg — ABNORMAL LOW (ref 44.0–60.0)
pH, Ven: 7.342 (ref 7.250–7.430)
pO2, Ven: 36 mmHg (ref 32.0–45.0)

## 2019-10-09 LAB — PROTIME-INR
INR: 1.1 (ref 0.8–1.2)
Prothrombin Time: 14.1 seconds (ref 11.4–15.2)

## 2019-10-09 SURGERY — RIGHT/LEFT HEART CATH AND CORONARY ANGIOGRAPHY
Anesthesia: LOCAL

## 2019-10-09 MED ORDER — IOHEXOL 350 MG/ML SOLN
INTRAVENOUS | Status: DC | PRN
  Administered 2019-10-09: 55 mL via INTRA_ARTERIAL

## 2019-10-09 MED ORDER — HEPARIN (PORCINE) IN NACL 1000-0.9 UT/500ML-% IV SOLN
INTRAVENOUS | Status: AC
  Filled 2019-10-09: qty 500

## 2019-10-09 MED ORDER — VERAPAMIL HCL 2.5 MG/ML IV SOLN
INTRAVENOUS | Status: AC
  Filled 2019-10-09: qty 2

## 2019-10-09 MED ORDER — ASPIRIN 81 MG PO CHEW: 81.0000 mg | CHEWABLE_TABLET | ORAL | Status: DC

## 2019-10-09 MED ORDER — ENOXAPARIN SODIUM 60 MG/0.6ML ~~LOC~~ SOLN: 60.0000 mg | SUBCUTANEOUS | 1 refills | Status: DC

## 2019-10-09 MED ORDER — HEPARIN (PORCINE) IN NACL 1000-0.9 UT/500ML-% IV SOLN
INTRAVENOUS | Status: DC | PRN
  Administered 2019-10-09 (×2): 500 mL

## 2019-10-09 MED ORDER — SODIUM CHLORIDE 0.9% FLUSH: 3.0000 mL | Freq: Two times a day (BID) | INTRAVENOUS | Status: DC

## 2019-10-09 MED ORDER — SODIUM CHLORIDE 0.9 % WEIGHT BASED INFUSION: 1.0000 mL/kg/h | INTRAVENOUS | Status: DC

## 2019-10-09 MED ORDER — MIDAZOLAM HCL 2 MG/2ML IJ SOLN
INTRAMUSCULAR | Status: DC | PRN
  Administered 2019-10-09: 1 mg via INTRAVENOUS

## 2019-10-09 MED ORDER — SODIUM CHLORIDE 0.9 % IV SOLN: 250.0000 mL | INTRAVENOUS | Status: DC | PRN

## 2019-10-09 MED ORDER — LIDOCAINE HCL (PF) 1 % IJ SOLN
INTRAMUSCULAR | Status: DC | PRN
  Administered 2019-10-09: 2 mL via INTRADERMAL
  Administered 2019-10-09: 2 mL

## 2019-10-09 MED ORDER — VERAPAMIL HCL 2.5 MG/ML IV SOLN
INTRAVENOUS | Status: DC | PRN
  Administered 2019-10-09: 10 mL via INTRA_ARTERIAL

## 2019-10-09 MED ORDER — HEPARIN SODIUM (PORCINE) 1000 UNIT/ML IJ SOLN
INTRAMUSCULAR | Status: DC | PRN
  Administered 2019-10-09: 2500 [IU] via INTRAVENOUS

## 2019-10-09 MED ORDER — HEPARIN SODIUM (PORCINE) 1000 UNIT/ML IJ SOLN
INTRAMUSCULAR | Status: AC
  Filled 2019-10-09: qty 1

## 2019-10-09 MED ORDER — ACETAMINOPHEN 325 MG PO TABS: 650.0000 mg | ORAL_TABLET | ORAL | Status: DC | PRN

## 2019-10-09 MED ORDER — LIDOCAINE HCL (PF) 1 % IJ SOLN
INTRAMUSCULAR | Status: AC
  Filled 2019-10-09: qty 30

## 2019-10-09 MED ORDER — SODIUM CHLORIDE 0.9% FLUSH: 3.0000 mL | INTRAVENOUS | Status: DC | PRN

## 2019-10-09 MED ORDER — SODIUM CHLORIDE 0.9 % IV SOLN: INTRAVENOUS | Status: DC

## 2019-10-09 MED ORDER — FENTANYL CITRATE (PF) 100 MCG/2ML IJ SOLN
INTRAMUSCULAR | Status: DC | PRN
  Administered 2019-10-09: 25 ug via INTRAVENOUS

## 2019-10-09 MED ORDER — ONDANSETRON HCL 4 MG/2ML IJ SOLN: 4.0000 mg | Freq: Four times a day (QID) | INTRAMUSCULAR | Status: DC | PRN

## 2019-10-09 MED ORDER — MIDAZOLAM HCL 2 MG/2ML IJ SOLN
INTRAMUSCULAR | Status: AC
  Filled 2019-10-09: qty 2

## 2019-10-09 MED ORDER — FENTANYL CITRATE (PF) 100 MCG/2ML IJ SOLN
INTRAMUSCULAR | Status: AC
  Filled 2019-10-09: qty 2

## 2019-10-09 SURGICAL SUPPLY — 14 items
CATH 5FR JL3.5 JR4 ANG PIG MP (CATHETERS) ×2 IMPLANT
CATH BALLN WEDGE 5F 110CM (CATHETERS) ×2 IMPLANT
CATH INFINITI 5FR AL1 (CATHETERS) ×2 IMPLANT
DEVICE RAD COMP TR BAND LRG (VASCULAR PRODUCTS) ×2 IMPLANT
GLIDESHEATH SLEND SS 6F .021 (SHEATH) ×2 IMPLANT
GUIDEWIRE .025 260CM (WIRE) ×2 IMPLANT
GUIDEWIRE INQWIRE 1.5J.035X260 (WIRE) ×1 IMPLANT
INQWIRE 1.5J .035X260CM (WIRE) ×2
KIT HEART LEFT (KITS) ×2 IMPLANT
PACK CARDIAC CATHETERIZATION (CUSTOM PROCEDURE TRAY) ×2 IMPLANT
SHEATH GLIDE SLENDER 4/5FR (SHEATH) ×2 IMPLANT
SHEATH PROBE COVER 6X72 (BAG) ×2 IMPLANT
TRANSDUCER W/STOPCOCK (MISCELLANEOUS) ×2 IMPLANT
TUBING CIL FLEX 10 FLL-RA (TUBING) ×2 IMPLANT

## 2019-10-09 NOTE — Progress Notes (Signed)
BMP order placed

## 2019-10-09 NOTE — Discharge Instructions (Addendum)
Radial Site Care Resume Lovenox and Coumadin as outlined in Pharmacy note of 10/02/19.   This sheet gives you information about how to care for yourself after your procedure. Your health care provider may also give you more specific instructions. If you have problems or questions, contact your health care provider. What can I expect after the procedure? After the procedure, it is common to have:  Bruising and tenderness at the catheter insertion area. Follow these instructions at home: Medicines  Take over-the-counter and prescription medicines only as told by your health care provider. Insertion site care  Follow instructions from your health care provider about how to take care of your insertion site. Make sure you: ? Wash your hands with soap and water before you change your bandage (dressing). If soap and water are not available, use hand sanitizer. ? Change your dressing as told by your health care provider. ? Leave stitches (sutures), skin glue, or adhesive strips in place. These skin closures may need to stay in place for 2 weeks or longer. If adhesive strip edges start to loosen and curl up, you may trim the loose edges. Do not remove adhesive strips completely unless your health care provider tells you to do that.  Check your insertion site every day for signs of infection. Check for: ? Redness, swelling, or pain. ? Fluid or blood. ? Pus or a bad smell. ? Warmth.  Do not take baths, swim, or use a hot tub until your health care provider approves.  You may shower 24-48 hours after the procedure, or as directed by your health care provider. ? Remove the dressing and gently wash the site with plain soap and water. ? Pat the area dry with a clean towel. ? Do not rub the site. That could cause bleeding.  Do not apply powder or lotion to the site. Activity   For 24 hours after the procedure, or as directed by your health care provider: ? Do not flex or bend the affected  arm. ? Do not push or pull heavy objects with the affected arm. ? Do not drive yourself home from the hospital or clinic. You may drive 24 hours after the procedure unless your health care provider tells you not to. ? Do not operate machinery or power tools.  Do not lift anything that is heavier than 10 lb (4.5 kg), or the limit that you are told, until your health care provider says that it is safe.  Ask your health care provider when it is okay to: ? Return to work or school. ? Resume usual physical activities or sports. ? Resume sexual activity. General instructions  If the catheter site starts to bleed, raise your arm and put firm pressure on the site. If the bleeding does not stop, get help right away. This is a medical emergency.  If you went home on the same day as your procedure, a responsible adult should be with you for the first 24 hours after you arrive home.  Keep all follow-up visits as told by your health care provider. This is important. Contact a health care provider if:  You have a fever.  You have redness, swelling, or yellow drainage around your insertion site. Get help right away if:  You have unusual pain at the radial site.  The catheter insertion area swells very fast.  The insertion area is bleeding, and the bleeding does not stop when you hold steady pressure on the area.  Your arm or hand becomes  pale, cool, tingly, or numb. These symptoms may represent a serious problem that is an emergency. Do not wait to see if the symptoms will go away. Get medical help right away. Call your local emergency services (911 in the [place] Do not drive yourself to the hospital. Summary  After the procedure, it is common to have bruising and tenderness at the site.  Follow instructions from your health care provider about how to take care of your radial site wound. Check the wound every day for signs of infection.  Do not lift anything that is heavier than 10 lb (4.5  kg), or the limit that you are told, until your health care provider says that it is safe. This information is not intended to replace advice given to you by your health care provider. Make sure you discuss any questions you have with your health care provider. Document Revised: 09/28/2017 Document Reviewed: 09/28/2017 Elsevier Patient Education  [employer]. Resume Lovenox and Coumadin as outlined in Pharmacy note of 10/02/19.

## 2019-10-09 NOTE — Interval H&P Note (Signed)
History and Physical Interval Note:  10/09/2019 7:17 AM  [name]  has presented today for surgery, with the diagnosis of shortness of breath.  The various methods of treatment have been discussed with the patient and family. After consideration of risks, benefits and other options for treatment, the patient has consented to  Procedure(s): RIGHT/LEFT HEART CATH AND CORONARY ANGIOGRAPHY (N/A) as a surgical intervention.  The patient's history has been reviewed, patient examined, no change in status, stable for surgery.  I have reviewed the patient's chart and labs.  Questions were answered to the patient's satisfaction.     Collier Salina [facility] 10/09/2019 7:17 AM

## 2019-10-10 ENCOUNTER — Other Ambulatory Visit: Payer: Self-pay | Admitting: Internal Medicine

## 2019-10-10 ENCOUNTER — Telehealth: Payer: Self-pay | Admitting: Cardiology

## 2019-10-10 NOTE — Telephone Encounter (Signed)
Called pt's daughter [name], made appt for INR to be done in office 10/15/19 at 10:30am, since pt is having other labwork done prior to her CT appt on 10/16/19.

## 2019-10-10 NOTE — Telephone Encounter (Signed)
Pts daughter [name] on [place] called and we went over her post cath instructions.   She [place] asking to let Dr. [name] know that the pt has h/o neuropathy and since her cath she has had some right leg "heaviness" and weakness... no pain, edema, or any other symptoms that would be indication of CVA... no slurring of words, no other right sided symptoms.... her daughter says she vaguely remembers this happening before after a procedure when she had to lay flat for a period of time. Pt denies headache, dizziness, and feels well otherwise. She is still ambulating as she normally does.   I have asked her to continue to monitor and if she develops any worsening or new symptoms to call us back or to call EMS or got to the ED.   Pts daughter is also asking if she can cancel the [facility] nurse for Monday 10/15/19 that is coming to the house to do a PT/ INR since she has planned labwork at the [facility] office the same day for her CT on 10/16/19 and have the INR done at the office.   Will forward to the [facility] for review.  Will forward to Dr. [name] to review.

## 2019-10-10 NOTE — Telephone Encounter (Signed)
Patient's daughter calling stating the patient had a heart cath yesterday 2/2 and she does not remember how to care for the bandage sight. She also states the patient's right leg is very weak and heavy. She would like a call back with advise.

## 2019-10-10 NOTE — Telephone Encounter (Signed)
Agree with your recommendation. Will monitor  Dillinger Aston [name] MD, [facility]

## 2019-10-11 DIAGNOSIS — G2581 Restless legs syndrome: Secondary | ICD-10-CM | POA: Diagnosis not present

## 2019-10-11 DIAGNOSIS — N183 Chronic kidney disease, stage 3 unspecified: Secondary | ICD-10-CM | POA: Diagnosis not present

## 2019-10-11 DIAGNOSIS — K3184 Gastroparesis: Secondary | ICD-10-CM | POA: Diagnosis not present

## 2019-10-11 DIAGNOSIS — I495 Sick sinus syndrome: Secondary | ICD-10-CM | POA: Diagnosis not present

## 2019-10-11 DIAGNOSIS — I272 Pulmonary hypertension, unspecified: Secondary | ICD-10-CM | POA: Diagnosis not present

## 2019-10-11 DIAGNOSIS — I2510 Atherosclerotic heart disease of native coronary artery without angina pectoris: Secondary | ICD-10-CM | POA: Diagnosis not present

## 2019-10-11 DIAGNOSIS — E875 Hyperkalemia: Secondary | ICD-10-CM | POA: Diagnosis not present

## 2019-10-11 DIAGNOSIS — Z5181 Encounter for therapeutic drug level monitoring: Secondary | ICD-10-CM | POA: Diagnosis not present

## 2019-10-11 DIAGNOSIS — Z85038 Personal history of other malignant neoplasm of large intestine: Secondary | ICD-10-CM | POA: Diagnosis not present

## 2019-10-11 DIAGNOSIS — I130 Hypertensive heart and chronic kidney disease with heart failure and stage 1 through stage 4 chronic kidney disease, or unspecified chronic kidney disease: Secondary | ICD-10-CM | POA: Diagnosis not present

## 2019-10-11 DIAGNOSIS — G5791 Unspecified mononeuropathy of right lower limb: Secondary | ICD-10-CM | POA: Diagnosis not present

## 2019-10-11 DIAGNOSIS — J301 Allergic rhinitis due to pollen: Secondary | ICD-10-CM | POA: Diagnosis not present

## 2019-10-11 DIAGNOSIS — I5033 Acute on chronic diastolic (congestive) heart failure: Secondary | ICD-10-CM | POA: Diagnosis not present

## 2019-10-11 DIAGNOSIS — H6523 Chronic serous otitis media, bilateral: Secondary | ICD-10-CM | POA: Diagnosis not present

## 2019-10-11 DIAGNOSIS — Z7901 Long term (current) use of anticoagulants: Secondary | ICD-10-CM | POA: Diagnosis not present

## 2019-10-11 DIAGNOSIS — I480 Paroxysmal atrial fibrillation: Secondary | ICD-10-CM | POA: Diagnosis not present

## 2019-10-11 DIAGNOSIS — H5462 Unqualified visual loss, left eye, normal vision right eye: Secondary | ICD-10-CM | POA: Diagnosis not present

## 2019-10-11 DIAGNOSIS — Z85028 Personal history of other malignant neoplasm of stomach: Secondary | ICD-10-CM | POA: Diagnosis not present

## 2019-10-11 DIAGNOSIS — I083 Combined rheumatic disorders of mitral, aortic and tricuspid valves: Secondary | ICD-10-CM | POA: Diagnosis not present

## 2019-10-11 DIAGNOSIS — E559 Vitamin D deficiency, unspecified: Secondary | ICD-10-CM | POA: Diagnosis not present

## 2019-10-11 DIAGNOSIS — Z950 Presence of cardiac pacemaker: Secondary | ICD-10-CM | POA: Diagnosis not present

## 2019-10-11 DIAGNOSIS — E039 Hypothyroidism, unspecified: Secondary | ICD-10-CM | POA: Diagnosis not present

## 2019-10-11 DIAGNOSIS — Z8553 Personal history of malignant neoplasm of renal pelvis: Secondary | ICD-10-CM | POA: Diagnosis not present

## 2019-10-11 DIAGNOSIS — K219 Gastro-esophageal reflux disease without esophagitis: Secondary | ICD-10-CM | POA: Diagnosis not present

## 2019-10-11 NOTE — Telephone Encounter (Addendum)
LM for [name] RE: Dr. Doug Sou recommendation to monitor the pt and to call us back if anything changes or worsens.

## 2019-10-15 ENCOUNTER — Other Ambulatory Visit: Payer: Medicare Other | Admitting: *Deleted

## 2019-10-15 ENCOUNTER — Ambulatory Visit (INDEPENDENT_AMBULATORY_CARE_PROVIDER_SITE_OTHER): Payer: Medicare Other | Admitting: *Deleted

## 2019-10-15 ENCOUNTER — Other Ambulatory Visit: Payer: Self-pay

## 2019-10-15 DIAGNOSIS — Z5181 Encounter for therapeutic drug level monitoring: Secondary | ICD-10-CM | POA: Diagnosis not present

## 2019-10-15 DIAGNOSIS — Z7901 Long term (current) use of anticoagulants: Secondary | ICD-10-CM

## 2019-10-15 DIAGNOSIS — I350 Nonrheumatic aortic (valve) stenosis: Secondary | ICD-10-CM | POA: Diagnosis not present

## 2019-10-15 LAB — BASIC METABOLIC PANEL
BUN/Creatinine Ratio: 16 (ref 12–28)
BUN: 16 mg/dL (ref 8–27)
CO2: 22 mmol/L (ref 20–29)
Calcium: 8.8 mg/dL (ref 8.7–10.3)
Chloride: 105 mmol/L (ref 96–106)
Creatinine, Ser: 0.99 mg/dL (ref 0.57–1.00)
GFR calc Af Amer: 61 mL/min/{1.73_m2} (ref >59)
GFR calc non Af Amer: 53 mL/min/{1.73_m2} — ABNORMAL LOW (ref >59)
Glucose: 82 mg/dL (ref 65–99)
Potassium: 3.7 mmol/L (ref 3.5–5.2)
Sodium: 144 mmol/L (ref 134–144)

## 2019-10-15 LAB — POCT INR: INR: 1.7 — AB (ref 2.0–3.0)

## 2019-10-15 NOTE — Patient Instructions (Signed)
Description   Continue Lovenox once a day. Today take 3mg  of Warfarin and then continue taking 2mg  daily. Recheck INR on Thursday 2/11. Call us with any medication changes or concerns # [phone].

## 2019-10-15 NOTE — Progress Notes (Signed)
[place], [phone].   Called and spoke to [place] who stated that she could go out on 2/11 and check pt's INR.

## 2019-10-16 ENCOUNTER — Ambulatory Visit (HOSPITAL_COMMUNITY)
Admission: RE | Admit: 2019-10-16 | Discharge: 2019-10-16 | Disposition: A | Discharge status: Home / Self Care | Payer: Medicare Other | Source: Ambulatory Visit | Attending: Cardiovascular Disease | Admitting: Cardiovascular Disease

## 2019-10-16 ENCOUNTER — Encounter (HOSPITAL_COMMUNITY): Payer: Self-pay

## 2019-10-16 ENCOUNTER — Ambulatory Visit (HOSPITAL_COMMUNITY): Payer: Medicare Other

## 2019-10-16 DIAGNOSIS — I700 Atherosclerosis of aorta: Secondary | ICD-10-CM | POA: Diagnosis not present

## 2019-10-16 DIAGNOSIS — I350 Nonrheumatic aortic (valve) stenosis: Secondary | ICD-10-CM | POA: Insufficient documentation

## 2019-10-16 MED ORDER — IOHEXOL 350 MG/ML SOLN
95.0000 mL | Freq: Once | INTRAVENOUS | Status: AC | PRN
  Administered 2019-10-16: 95 mL via INTRAVENOUS

## 2019-10-17 ENCOUNTER — Encounter: Payer: Self-pay | Admitting: Physician Assistant

## 2019-10-18 ENCOUNTER — Ambulatory Visit (INDEPENDENT_AMBULATORY_CARE_PROVIDER_SITE_OTHER): Payer: Medicare Other

## 2019-10-18 ENCOUNTER — Ambulatory Visit: Payer: Medicare Other | Admitting: Medical

## 2019-10-18 DIAGNOSIS — Z85028 Personal history of other malignant neoplasm of stomach: Secondary | ICD-10-CM | POA: Diagnosis not present

## 2019-10-18 DIAGNOSIS — I5033 Acute on chronic diastolic (congestive) heart failure: Secondary | ICD-10-CM | POA: Diagnosis not present

## 2019-10-18 DIAGNOSIS — I495 Sick sinus syndrome: Secondary | ICD-10-CM | POA: Diagnosis not present

## 2019-10-18 DIAGNOSIS — H5462 Unqualified visual loss, left eye, normal vision right eye: Secondary | ICD-10-CM | POA: Diagnosis not present

## 2019-10-18 DIAGNOSIS — I130 Hypertensive heart and chronic kidney disease with heart failure and stage 1 through stage 4 chronic kidney disease, or unspecified chronic kidney disease: Secondary | ICD-10-CM | POA: Diagnosis not present

## 2019-10-18 DIAGNOSIS — I2510 Atherosclerotic heart disease of native coronary artery without angina pectoris: Secondary | ICD-10-CM | POA: Diagnosis not present

## 2019-10-18 DIAGNOSIS — E875 Hyperkalemia: Secondary | ICD-10-CM | POA: Diagnosis not present

## 2019-10-18 DIAGNOSIS — E039 Hypothyroidism, unspecified: Secondary | ICD-10-CM | POA: Diagnosis not present

## 2019-10-18 DIAGNOSIS — I480 Paroxysmal atrial fibrillation: Secondary | ICD-10-CM | POA: Diagnosis not present

## 2019-10-18 DIAGNOSIS — K219 Gastro-esophageal reflux disease without esophagitis: Secondary | ICD-10-CM | POA: Diagnosis not present

## 2019-10-18 DIAGNOSIS — Z85038 Personal history of other malignant neoplasm of large intestine: Secondary | ICD-10-CM | POA: Diagnosis not present

## 2019-10-18 DIAGNOSIS — Z7901 Long term (current) use of anticoagulants: Secondary | ICD-10-CM | POA: Diagnosis not present

## 2019-10-18 DIAGNOSIS — Z5181 Encounter for therapeutic drug level monitoring: Secondary | ICD-10-CM | POA: Diagnosis not present

## 2019-10-18 DIAGNOSIS — Z950 Presence of cardiac pacemaker: Secondary | ICD-10-CM | POA: Diagnosis not present

## 2019-10-18 DIAGNOSIS — G2581 Restless legs syndrome: Secondary | ICD-10-CM | POA: Diagnosis not present

## 2019-10-18 DIAGNOSIS — K3184 Gastroparesis: Secondary | ICD-10-CM | POA: Diagnosis not present

## 2019-10-18 DIAGNOSIS — I083 Combined rheumatic disorders of mitral, aortic and tricuspid valves: Secondary | ICD-10-CM | POA: Diagnosis not present

## 2019-10-18 DIAGNOSIS — E559 Vitamin D deficiency, unspecified: Secondary | ICD-10-CM | POA: Diagnosis not present

## 2019-10-18 DIAGNOSIS — Z8553 Personal history of malignant neoplasm of renal pelvis: Secondary | ICD-10-CM | POA: Diagnosis not present

## 2019-10-18 DIAGNOSIS — I272 Pulmonary hypertension, unspecified: Secondary | ICD-10-CM | POA: Diagnosis not present

## 2019-10-18 DIAGNOSIS — H6523 Chronic serous otitis media, bilateral: Secondary | ICD-10-CM | POA: Diagnosis not present

## 2019-10-18 DIAGNOSIS — N183 Chronic kidney disease, stage 3 unspecified: Secondary | ICD-10-CM | POA: Diagnosis not present

## 2019-10-18 DIAGNOSIS — G5791 Unspecified mononeuropathy of right lower limb: Secondary | ICD-10-CM | POA: Diagnosis not present

## 2019-10-18 DIAGNOSIS — J301 Allergic rhinitis due to pollen: Secondary | ICD-10-CM | POA: Diagnosis not present

## 2019-10-18 LAB — POCT INR: INR: 2.4 (ref 2.0–3.0)

## 2019-10-18 NOTE — Patient Instructions (Signed)
Description   Spoke with Tabitha, RN [facility] while in pt's home, advised pt can stop Lovenox, and continue on same dosage of Warfarin 2mg  daily. Recheck INR in 1 week. Call us with any medication changes or concerns # [phone].

## 2019-10-22 ENCOUNTER — Institutional Professional Consult (permissible substitution): Payer: Medicare Other | Admitting: Thoracic Surgery (Cardiothoracic Vascular Surgery)

## 2019-10-22 ENCOUNTER — Encounter: Payer: Self-pay | Admitting: Thoracic Surgery (Cardiothoracic Vascular Surgery)

## 2019-10-22 ENCOUNTER — Other Ambulatory Visit: Payer: Self-pay

## 2019-10-22 ENCOUNTER — Encounter: Payer: Self-pay | Admitting: Physical Therapy

## 2019-10-22 ENCOUNTER — Ambulatory Visit: Payer: Medicare Other | Attending: Cardiovascular Disease | Admitting: Physical Therapy

## 2019-10-22 VITALS — BP 165/77 | HR 83 | Temp 97.7°F | Resp 16 | Ht 59.0 in | Wt 99.2 lb

## 2019-10-22 DIAGNOSIS — R262 Difficulty in walking, not elsewhere classified: Secondary | ICD-10-CM | POA: Diagnosis not present

## 2019-10-22 DIAGNOSIS — I350 Nonrheumatic aortic (valve) stenosis: Secondary | ICD-10-CM | POA: Diagnosis not present

## 2019-10-22 NOTE — H&P (View-Only) (Signed)
HEART AND [place] SURGERY CONSULTATION REPORT  Primary Cardiologist is Valerie [name], MD PCP is Valerie Pinto, MD  Chief Complaint  Patient presents with  . Aortic Stenosis    TAVR EVAL...review all required studies and procedures    HPI:  Patient is an 86 year old female with history of aortic stenosis, chronic diastolic congestive heart failure, paroxysmal atrial fibrillation on long-term warfarin anticoagulation, tachybradycardia syndrome status post permanent pacemaker placement, hypertension, hyperlipidemia, previous stroke, gastric cancer status post surgical resection, renal cell carcinoma status post nephrectomy, stage III chronic kidney disease, anemia, and bladder cancer treated with vesicular chemotherapy who has been referred for surgical consultation to discuss treatment options for management of severe paradoxical low flow low gradient aortic stenosis.  Patient has a longstanding history of chronic diastolic congestive heart failure and atrial fibrillation.  She was followed for many years by Dr. Mare [name] and more recently by Dr. [name].  She underwent permanent pacemaker implantation in the distant past for tachybradycardia syndrome and has been on long-term warfarin anticoagulation for many years.  She has developed progressive exertional shortness of breath, fatigue, and fluid retention over the past 2 years.  She has required escalating doses of diuretic therapy and she has been staying in persistent atrial fibrillation.  Follow-up transthoracic echocardiogram performed September 17, 2019 findings consistent with paradoxical low flow low gradient severe aortic stenosis.  Ejection fraction was estimated 60 to 65%.  The aortic valve was severely calcified with severely restricted leaflet motion.  Peak velocity across aortic valve measured 3.3 m/s corresponding to mean transvalvular gradient estimated 25 mmHg.  The DVI  was notably quite low at 0.21 with aortic valve area calculated 0.60 cm and stroke-volume only 43 mL with stroke-volume index 31 mL/m.  The patient was referred to the multidisciplinary heart valve clinic and has been evaluated previously by Dr. Angelena [name].  Diagnostic cardiac catheterization performed by Dr. [name] on October 09, 2019 revealed nonobstructive coronary artery disease.  Peak to peak and mean transvalvular gradients across aortic valve were measured 19 and 11.4 mmHg respectively corresponding to aortic valve area calculated only 0.84 cm.  CT angiography was performed of the patient was referred for surgical consultation.  Patient is widowed and lives locally with her daughter in [name].  She has some difficulty walking and currently ambulates short distances using a walker for assistance.  She states that her walking has gotten a bit worse with increased weakness in her legs since her diagnostic catheterization.  However, she has had chronic problems in her right leg dating back many years related to multiple previous surgical procedures.  She gets short of breath with low-level activity and occasionally at rest.  She gets tightness across her chest with exertion.  She cannot lay flat in bed.  She has frequent palpitations.  She is not having dizzy spells or syncope.  She reports occasional mild swelling in her legs.  She is a member of the [employer] and will not accept blood products.  She has been on long-term warfarin anticoagulation for many years without significant bleeding complications.  She has history of stroke in the remote past manifest as visual field deficit in left visual field  Past Medical History:  Diagnosis Date  . Adenocarcinoma of stomach ([place]) 11/11/2015   gastric mass on egd  . Anal fissure   . Anemia    hx 3/17 iron infusion, on aranesp and injected B12 since 11/2015.   [name] Anxiety   .  Aortic root dilatation ([place])   . Arthritis    oa  . 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 CAD (coronary artery disease)   . Chronic kidney disease    only has one kidney rt lft rem 03 ca     dr. Risa [name], bladder cancer 05-04-2018  . Elevated LFTs    hepatic steatosis, none recent  . Gastroparesis   . GERD (gastroesophageal reflux disease)   . History of uterine cancer 1980s   treated with hysterectomy, external radiation and radiation seed implants.   [name] HTN (hypertension)   . Hyperlipemia   . Hypothyroidism   . Iron deficiency anemia due to chronic blood loss 11/24/2015  . LVH (left ventricular hypertrophy) 11/14/2015   Mild, noted on ECHO  . Neuropathy    right leg  . Osteomyelitis ([place]) as child  . PAF (paroxysmal atrial fibrillation) ([place])   . Presence of permanent cardiac pacemaker   . Radiation proctitis   . Restless leg syndrome   . Severe aortic stenosis   . Stroke ([place]) 15   partial stroke left legally blind; left eye   . Tachycardia-bradycardia syndrome ([place])    s/p PPM by Dr [name] (MDT) 07/03/10    Past Surgical History:  Procedure Laterality Date  . CARDIAC CATHETERIZATION  2008  . CHOLECYSTECTOMY  1970s  . COLONOSCOPY N/A 11/11/2015   Procedure: COLONOSCOPY;  Surgeon: Valerie Mayer, MD;  [facility];  Service: Endoscopy;  Laterality: N/A;  . CYSTOSCOPY W/ RETROGRADES Right 05/01/2018   Procedure: CYSTOSCOPY WITH RIGHT RETROGRADE PYELOGRAM;  Surgeon: Valerie Mallow, MD;  [facility]: [facility];  Service: Urology;  Laterality: Right;  . CYSTOSCOPY W/ RETROGRADES Right 07/21/2018   Procedure: CYSTOSCOPY WITH RIGHT RETROGRADE PYELOGRAM;  Surgeon: Valerie Frock, MD;  [facility];  Service: Urology;  Laterality: Right;  . ESOPHAGOGASTRODUODENOSCOPY N/A 11/11/2015   Procedure: ESOPHAGOGASTRODUODENOSCOPY (EGD);  Surgeon: Valerie Mayer, MD;  Location: [facility];  Service: Endoscopy;  Laterality: N/A;  . ESOPHAGOGASTRODUODENOSCOPY N/A 02/05/2016   Procedure: ESOPHAGOGASTRODUODENOSCOPY (EGD);  Surgeon: Valerie Artist, MD;   Location: [facility];  Service: Endoscopy;  Laterality: N/A;  . ESOPHAGOGASTRODUODENOSCOPY N/A 04/02/2016   Procedure: ESOPHAGOGASTRODUODENOSCOPY (EGD);  Surgeon: Valerie Mayer, MD;  Location: [facility];  Service: Endoscopy;  Laterality: N/A;  . ESOPHAGOGASTRODUODENOSCOPY (EGD) WITH PROPOFOL N/A 04/06/2017   Procedure: ESOPHAGOGASTRODUODENOSCOPY (EGD) WITH PROPOFOL;  Surgeon: Valerie Mayer, MD;  [facility];  Service: Endoscopy;  Laterality: N/A;  . GASTRECTOMY N/A 01/15/2016   Procedure: OPEN PARTIAL GASTRECTOMY;  Surgeon: Stark Klein, MD;  Location: [facility];  Service: General;  Laterality: N/A;  . GASTRECTOMY Left 02/24/2016   Procedure: OPEN J TUBE;  Surgeon: Stark Klein, MD;  Location: [place];  Service: General;  Laterality: Left;  [name] GASTROJEJUNOSTOMY N/A 05/18/2016   Procedure: ROUX EN [place];  Surgeon: Stark Klein, MD;  Location: [facility];  Service: General;  Laterality: N/A;  . I & D KNEE WITH POLY EXCHANGE Right 12/17/2013   Procedure: IRRIGATION AND DEBRIDEMEN RIGHT TOTAL  KNEE WITH POLY EXCHANGE;  Surgeon: Mauri Pole, MD;  [facility];  Service: Orthopedics;  Laterality: Right;  . IR GENERIC HISTORICAL  06/17/2016   IR RADIOLOGIST EVAL & MGMT 06/17/2016 Ascencion Dike, PA-C [facility] INTERV RAD  . LAPAROSCOPY N/A 01/15/2016   Procedure: LAPAROSCOPY DIAGNOSTIC;  Surgeon: Stark Klein, MD;  Location: [facility];  Service: General;  Laterality: N/A;  . LYSIS OF ADHESION  05/18/2016   Procedure:  LYSIS OF ADHESION;  Surgeon: Stark Klein, MD;  Location: [facility];  Service: General;;  . NEPHRECTOMY Left    renal cell cancer  . PACEMAKER INSERTION  07/04/2011   MDT PPM implanted by Dr [name] for tachy/brady; managed by Dr Thompson Grayer   . PATELLAR TENDON REPAIR Right 03/06/2014   Procedure: AVULSION WITH PATELLA TENDON REPAIR;  Surgeon: Mauri Pole, MD;  [facility];  Service: Orthopedics;  Laterality: Right;  . PATENT DUCTUS ARTERIOUS REPAIR  ~ 1949  . RIGHT/LEFT  HEART CATH AND CORONARY ANGIOGRAPHY N/A 10/09/2019   Procedure: RIGHT/LEFT HEART CATH AND CORONARY ANGIOGRAPHY;  Surgeon: [name], Valerie M, MD;  Location: [facility];  Service: Cardiovascular;  Laterality: N/A;  . TOTAL ABDOMINAL HYSTERECTOMY  85  . TOTAL HIP REVISION Right 2009   initial replacment surg  . TOTAL KNEE ARTHROPLASTY  02/07/2012   Procedure: TOTAL KNEE ARTHROPLASTY;  Surgeon: Mauri Pole, MD;  [facility];  Service: Orthopedics;  Laterality: Right;  . TOTAL KNEE REVISION Right 07/29/2014   Procedure: RIGHT KNEE REVISION OF PREVIOUS REPAIR EXTENSOR MECHANISM;  Surgeon: Mauri Pole, MD;  [facility];  Service: Orthopedics;  Laterality: Right;  . TRANSURETHRAL RESECTION OF BLADDER TUMOR N/A 07/21/2018   Procedure: TRANSURETHRAL RESECTION OF BLADDER TUMOR (TURBT);  Surgeon: Valerie Frock, MD;  [facility];  Service: Urology;  Laterality: N/A;  . TRANSURETHRAL RESECTION OF BLADDER TUMOR WITH MITOMYCIN-C N/A 05/01/2018   Procedure: TRANSURETHRAL RESECTION OF BLADDER TUMOR WITH POST OP INSTILLATION OF GEMCITABINE;  Surgeon: Valerie Mallow, MD;  [facility];  Service: Urology;  Laterality: N/A;  . WHIPPLE PROCEDURE N/A 05/18/2016   Procedure: EXPLORATORY LAPAROTOMY;  Surgeon: Stark Klein, MD;  [facility];  Service: General;  Laterality: N/A;    Family History  Problem Relation Age of Onset  . Colon cancer Mother 44  . Heart disease Mother   . Heart disease Father   . Heart attack Father   . Heart disease Maternal Grandmother     Social History   Socioeconomic History  . Marital status: Widowed    Spouse name: Not on file  . Number of children: 3  . Years of education: Not on file  . Highest education level: Not on file  Occupational History  . Not on file  Tobacco Use  . Smoking status: Never Smoker  . Smokeless tobacco: Never Used  Substance and Sexual Activity  . Alcohol use: No  . Drug use: No  . Sexual activity: Never  Other  Topics Concern  . Not on file  Social History Narrative   01/01/19 Lives in [place] w/dgtr, [name].  Continues to work.   Social Determinants of Health   Financial Resource Strain:   . Difficulty of Paying Living Expenses: Not on file  Food Insecurity:   . Worried About Charity fundraiser in the Last Year: Not on file  . Ran Out of Food in the Last Year: Not on file  Transportation Needs:   . Lack of Transportation (Medical): Not on file  . Lack of Transportation (Non-Medical): Not on file  Physical Activity:   . Days of Exercise per Week: Not on file  . Minutes of Exercise per Session: Not on file  Stress:   . Feeling of Stress : Not on file  Social Connections:   . Frequency of Communication with Friends and Family: Not on file  . Frequency of Social Gatherings with Friends  and Family: Not on file  . Attends Religious Services: Not on file  . Active Member of Clubs or Organizations: Not on file  . Attends Archivist Meetings: Not on file  . Marital Status: Not on file  Intimate Partner Violence:   . Fear of Current or Ex-Partner: Not on file  . Emotionally Abused: Not on file  . Physically Abused: Not on file  . Sexually Abused: Not on file    Current Outpatient Medications  Medication Sig Dispense Refill  . acetaminophen (TYLENOL) 500 MG tablet Take 500-1,000 mg by mouth every 6 (six) hours as needed for mild pain or headache.     . ALPRAZolam (XANAX) 0.5 MG tablet Take 0.5 mg by mouth 2 (two) times daily as needed for anxiety.     . Alum & Mag Hydroxide-Simeth (MYLANTA PO) Take 20 mLs by mouth every evening.     . Ascorbic Acid (VITAMIN C) 1000 MG tablet Take 1,000 mg by mouth daily.    . Biotin 10000 MCG TBDP Take 10,000 mcg by mouth daily.    . Cholecalciferol (VITAMIN D-3) 5000 units TABS Take 5,000 Units by mouth 3 (three) times daily.     [name] enoxaparin (LOVENOX) 60 MG/0.6ML injection Inject 0.6 mLs (60 mg total) into the skin daily. 6 mL 1  .  erythromycin ophthalmic ointment Place 1 application into the left eye at bedtime.    . famotidine (PEPCID) 40 MG tablet Take 1 tablet Daily for Indigestion & Heartburn (Patient taking differently: Take 40 mg by mouth daily. ) 90 tablet 3  . ferrous sulfate 325 (65 FE) MG tablet Take 325 mg by mouth daily in the afternoon.    . furosemide (LASIX) 20 MG tablet Take 20 mg by mouth daily as needed (fluid retention).     . gabapentin (NEURONTIN) 300 MG capsule TAKE 1 CAPSULE BY MOUTH THREE TIMES A DAY 270 capsule 1  . levothyroxine (SYNTHROID) 100 MCG tablet Take 1/2 tablet daily for thyroid. (Patient taking differently: Take 50 mcg by mouth daily before breakfast. ) 45 tablet 1  . loperamide (IMODIUM A-D) 2 MG tablet Take up to 12 tablets /day as needed for  for Diarrhea (Patient taking differently: Take 2-4 mg by mouth 4 (four) times daily as needed for diarrhea or loose stools. ) 120 tablet 11  . loratadine (CLARITIN) 10 MG tablet Take 10 mg by mouth at bedtime.     [name] losartan (COZAAR) 25 MG tablet Take 2 tablets (50 mg total) by mouth daily. (Patient taking differently: Take 50 mg by mouth every evening. ) 360 tablet 1  . metoprolol tartrate (LOPRESSOR) 100 MG tablet Take 1 tablet 2 x /day for BP (Patient taking differently: Take 100 mg by mouth 2 (two) times daily. ) 30 tablet 6  . nitroGLYCERIN (NITROSTAT) 0.4 MG SL tablet Dissolve 1 tablet under tongue every 5 minutes if needed for Angina 50 tablet prn  . ondansetron (ZOFRAN-ODT) 8 MG disintegrating tablet Take 8 mg by mouth every 8 (eight) hours as needed for nausea or vomiting.     . potassium chloride (KLOR-CON) 10 MEQ tablet Take 1 tablet (10 mEq total) by mouth daily. (Patient taking differently: Take 10 mEq by mouth daily as needed (with lasix). ) 90 tablet 1  . Propylene Glycol (SYSTANE COMPLETE) 0.6 % SOLN Place 1 drop into both eyes 3 (three) times daily as needed (irritation/dry eyes.).    [name] simethicone (MYLICON) 80 MG chewable tablet Chew  80 mg  by mouth every 6 (six) hours as needed for flatulence.    . sucralfate (CARAFATE) 1 g tablet TAKE 1 TABLET (1 G TOTAL) BY MOUTH 4 (FOUR) TIMES DAILY - WITH MEALS AND AT BEDTIME. 360 tablet 3  . tobramycin-dexamethasone (TOBRADEX) ophthalmic solution Place 1 drop into the left eye 3 (three) times daily.    [name] triamcinolone cream (KENALOG) 0.1 % Apply 1 application topically 2 (two) times daily as needed (rash/skin irritation.).     [name] verapamil (CALAN-SR) 120 MG CR tablet Take 1 tablet 2 x /day with a meal for BP & Heart Rhythm (Patient taking differently: Take 120 mg by mouth 2 (two) times daily. ) 180 tablet 3  . warfarin (COUMADIN) 2 MG tablet TAKE 1 TO 2 TABLETS AS DIRECTED TO PREVENT BLOOD CLOTS (Patient taking differently: Take 2 mg by mouth every evening. ) 180 tablet 1   No current facility-administered medications for this visit.    Allergies  Allergen Reactions  . Adhesive [Tape] Other (See Comments)    Tears skin, Please use "paper" tape  . Amiodarone Other (See Comments)     Thyroid and liver and kidney problems  . Hydrocodone Other (See Comments)    HALLLUCINATIONS  . Remeron [Mirtazapine] Other (See Comments)    Hallucinations and make pt loopy  . Codeine Nausea And Vomiting  . Morphine And Related Nausea And Vomiting  . Reglan [Metoclopramide] Hives, Itching and Rash  . Requip [Ropinirole Hcl] Other (See Comments)    Headache   . Zinc Nausea Only  . Other     NO BLOOD PRODUCTS      Review of Systems:   General:  normal appetite, decreased energy, no weight gain, no weight loss, no fever  Cardiac:  + chest pain with exertion, no chest pain at rest, +SOB with exertion, + occasional resting SOB, + PND, + orthopnea, + palpitations, + arrhythmia, + atrial fibrillation, + LE edema, NO dizzy spells, no syncope  Respiratory:  + exertional shortness of breath, no home oxygen, no productive cough, no dry cough, no bronchitis, no wheezing, no hemoptysis, no asthma, no pain  with inspiration or cough, no sleep apnea, no CPAP at night  GI:   no difficulty swallowing, + reflux, no frequent heartburn, no hiatal hernia, no abdominal pain, no constipation, occasional diarrhea, no hematochezia, no hematemesis, no melena  GU:   no dysuria,  no frequency, no urinary tract infection, no hematuria, no kidney stones, + kidney disease  Vascular:  + pain suggestive of claudication, no pain in feet, no leg cramps, no varicose veins, no DVT, no non-healing foot ulcer  Neuro:   no stroke, no TIA's, no seizures, no headaches, no temporary blindness one eye,  no slurred speech, + peripheral neuropathy, no chronic pain, + instability of gait, no memory/cognitive dysfunction  Musculoskeletal: + arthritis, no joint swelling, no myalgias, + difficulty walking, limited mobility   Skin:   no rash, no itching, no skin infections, no pressure sores or ulcerations  Psych:   no anxiety, no depression, no nervousness, no unusual recent stress  Eyes:   no blurry vision, no floaters, no recent vision changes, + wears glasses or contacts  ENT:   no hearing loss, no loose or painful teeth, + partial dentures, last saw dentist 2020  Hematologic:  + easy bruising, no abnormal bleeding, no clotting disorder, no frequent epistaxis  Endocrine:  no diabetes, does not check CBG's at home  Physical Exam:   BP (!) 165/77 (BP Location: Right Arm, Patient Position: Sitting, Cuff Size: Normal)   Pulse 83   Temp 97.7 F (36.5 C)   Resp 16   Ht 4\' 11"  (1.499 m)   Wt 99 lb 3.2 oz (45 kg)   SpO2 97% Comment: RA  BMI 20.04 kg/m   General:  Elderly and frail-appearing  HEENT:  Unremarkable   Neck:   no JVD, no bruits, no adenopathy   Chest:   clear to auscultation, symmetrical breath sounds, no wheezes, no rhonchi   CV:   RRR, grade III/VI crescendo/decrescendo murmur heard best at LLSB,  no diastolic murmur  Abdomen:  soft, non-tender, no masses   Extremities:  warm, well-perfused, pulses  diminished, no LE edema  Rectal/GU  Deferred  Neuro:   Grossly non-focal and symmetrical throughout  Skin:   Clean and dry, no rashes, no breakdown   Diagnostic Tests:  ECHOCARDIOGRAM REPORT       Patient Name:  Valerie REVOLORIO [name] Date of Exam: 09/17/2019  Medical Rec #: [id number]    Height:    59.0 in  Accession #:  [id number]    Weight:    99.6 lb  Date of Birth: 05-29-1934     BSA:     1.37 m  Patient Age:  86 years     BP:      174/84 mmHg  Patient Gender: F        HR:      84 bpm.  Exam Location: [address]   Procedure: 2D Echo, 3D Echo, Cardiac Doppler and Color Doppler   Indications:  I35.0 Aortic Stenosis    History:    Patient has prior history of Echocardiogram examinations,  most         recent 11/14/2015. Pacemaker; Risk Factors:Hypertension and         Dyslipidemia. Tachy-Brady Syndrome. Stroke. Paroxysmal  atrial         fibrillation. LVH. Hypothyroidism. Chronic kidney disease.         Coronary artery disease. Aorta root dilatation.    Sonographer:  [place]  Referring Phys: [id number]    1. Left ventricular ejection fraction, by visual estimation, is 60 to  65%. The left ventricle has normal function. There is moderately increased  left ventricular hypertrophy.  2. Left ventricular diastolic function could not be evaluated.  3. Small left ventricular internal cavity size.  4. The left ventricle has no regional wall motion abnormalities.  5. Global right ventricle has normal systolic function.The right  ventricular size is normal. No increase in right ventricular wall  thickness.  6. Left atrial size was moderately dilated.  7. The mitral valve is degenerative. Mild mitral valve regurgitation.  8. The tricuspid valve is grossly normal.  9. The aortic valve is severely calcified with restricted leaflet  motion.  Vmax 3.3 m/s, mean gradient 25 mmHG, DI 0.21, AVA 0.60 cm2. SV=43 cc and  SVi=31 cc/m2. Suspect there is paradoxical low-flow low-gradient severe  aortic stenosis. Moderate aortic  regurgitation is present. Recommend an aortic valve calcium score for  clarification of AoV stenosis severity.  10. Aortic valve regurgitation is moderate.  11. The aortic valve is tricuspid. Aortic valve regurgitation is moderate.  Moderate to severe aortic valve stenosis.  12. Mildly elevated pulmonary artery systolic pressure.  13. The tricuspid regurgitant velocity is 2.68 m/s, and with an assumed  right atrial pressure of 15 mmHg, the  estimated right ventricular systolic  pressure is mildly elevated at 43.8 mmHg.  14. A pacer wire is visualized in the RA and RV.  15. The inferior vena cava is dilated in size with <50% respiratory  variability, suggesting right atrial pressure of 15 mmHg.   In comparison to the previous echocardiogram(s): EF unchanged. Concerns  for paradoxical low-flow low-gradient severe AS on this study. Gradients  are higher compared with prior study (11/14/2015).  FINDINGS  Left Ventricle: Left ventricular ejection fraction, by visual estimation,  is 60 to 65%. The left ventricle has normal function. The left ventricle  has no regional wall motion abnormalities. The left ventricular internal  cavity size was the LV cavity  size is small. There is moderately increased left ventricular hypertrophy.  Concentric left ventricular hypertrophy. The left ventricular diastology  could not be evaluated due to atrial fibrillation. Left ventricular  diastolic function could not be  evaluated.   Right Ventricle: The right ventricular size is normal. No increase in  right ventricular wall thickness. Global RV systolic function is has  normal systolic function. The tricuspid regurgitant velocity is 2.68 m/s,  and with an assumed right atrial pressure  of 15 mmHg, the estimated right  ventricular systolic pressure is mildly  elevated at 43.8 mmHg.   Left Atrium: Left atrial size was moderately dilated.   Right Atrium: Right atrial size was mild-moderately dilated   Pericardium: There is no evidence of pericardial effusion.   Mitral Valve: The mitral valve is degenerative in appearance. Moderate  mitral annular calcification. Mild mitral valve regurgitation. MV peak  gradient, 8.1 mmHg.   Tricuspid Valve: The tricuspid valve is grossly normal. Tricuspid valve  regurgitation is mild.   Aortic Valve: The aortic valve is tricuspid. Aortic valve regurgitation is  moderate. Aortic regurgitation PHT measures 290 msec. Moderate to severe  aortic stenosis is present. Moderate to severe aortic valve annular  calcification. Aortic valve mean  gradient measures 25.2 mmHg. Aortic valve peak gradient measures 44.2  mmHg. Aortic valve area, by VTI measures 0.60 cm. The aortic valve is  severely calcified with restricted leaflet motion. Vmax 3.3 m/s, mean  gradient 25 mmHG, DI 0.21, AVA 0.60 cm2.  SV=43 cc and SVi=31 cc/m2. Suspect there is paradoxical low-flow  low-gradient severe aortic stenosis. Moderate aortic regurgitation is  present. Recommend an aortic valve calcium score for clarification of AoV  stenosis severity.   Pulmonic Valve: The pulmonic valve was grossly normal. Pulmonic valve  regurgitation is mild. Pulmonic regurgitation is mild.   Aorta: The aortic root and ascending aorta are structurally normal, with  no evidence of dilitation.   Venous: The inferior vena cava is dilated in size with less than 50%  respiratory variability, suggesting right atrial pressure of 15 mmHg.   IAS/Shunts: No atrial level shunt detected by color flow Doppler.   Additional Comments: A pacer wire is visualized in the right atrium and  right ventricle.    LEFT VENTRICLE  PLAX 2D  LVIDd:     3.20 cm Diastology  LVIDs:     1.97 cm LV e' lateral:  8.81 cm/s  LV  PW:     1.29 cm LV E/e' lateral: 14.6  LV IVS:    1.20 cm LV e' medial:  5.66 cm/s  LVOT diam:   1.90 cm LV E/e' medial: 22.8  LV SV:     29 ml  LV SV Index:  20.81  LVOT Area:   2.84 cm  RIGHT VENTRICLE  RV S prime:   9.46 cm/s  TAPSE (M-mode): 1.6 cm   LEFT ATRIUM       Index    RIGHT ATRIUM      Index  LA diam:    3.75 cm 2.73 cm/m RA Area:   20.80 cm  LA Vol (A2C):  64.3 ml 46.89 ml/m RA Volume:  65.40 ml 47.69 ml/m  LA Vol (A4C):  59.7 ml 43.53 ml/m  LA Biplane Vol: 67.7 ml 49.37 ml/m  AORTIC VALVE  AV Area (Vmax):  0.66 cm  AV Area (Vmean):  0.64 cm  AV Area (VTI):   0.60 cm  AV Vmax:      332.42 cm/s  AV Vmean:     233.799 cm/s  AV VTI:      0.719 m  AV Peak Grad:   44.2 mmHg  AV Mean Grad:   25.2 mmHg  LVOT Vmax:     76.97 cm/s  LVOT Vmean:    52.486 cm/s  LVOT VTI:     0.152 m  LVOT/AV VTI ratio: 0.21  AI PHT:      290 msec    AORTA  Ao Root diam: 3.65 cm  Ao Asc diam: 4.00 cm   MITRAL VALVE             TRICUSPID VALVE  MV Area (PHT): 3.39 cm       TR Peak grad:  28.8 mmHg  MV Peak grad: 8.1 mmHg       TR Vmax:    296.00 cm/s  MV Mean grad: 2.0 mmHg  MV Vmax:    1.42 m/s       SHUNTS  MV Vmean:   61.6 cm/s       Systemic VTI: 0.15 m  MV VTI:    0.28 m        Systemic Diam: 1.90 cm  MV PHT:    64.96 msec  MV Decel Time: 224 msec  MR Peak grad: 145.4 mmHg  MR Mean grad: 89.0 mmHg  MR Vmax:   603.00 cm/s  MR Vmean:   449.0 cm/s  MV E velocity: 129.00 cm/s 103 cm/s  MV A velocity: 37.80 cm/s 70.3 cm/s  MV E/A ratio: 3.41    1.5     Eleonore Chiquito MD  Electronically signed by Eleonore Chiquito MD  Signature Date/Time: 09/17/2019/5:35:43 PM      RIGHT/LEFT HEART CATH AND CORONARY ANGIOGRAPHY  Conclusion    Ost LAD to Prox LAD lesion is 60% stenosed.  1st Sept  lesion is 90% stenosed.  1st Diag lesion is 60% stenosed.  Ost RCA to Prox RCA lesion is 50% stenosed.  LV end diastolic pressure is normal.  There is severe aortic valve stenosis.  Hemodynamic findings consistent with aortic valve stenosis.   1. Moderate nonobstructive CAD except in a small septal perforator branch 2. Severe low flow/low gradient aortic stenosis. AVA 0.84 with index 0.61.  Mean gradient 11 mmHg 3. Normal right heart pressures 4. Reduced cardiac output with index 2.11.  5. Normal LV filling pressures.   Plan: proceed with evaluation for TAVR.    Recommendations  Antiplatelet/Anticoag Recommend to resume Warfarin, at currently prescribed dose and frequency on 10/09/2019. Concurrent antiplatelet therapy not recommended.  Indications  Severe aortic stenosis [I35.0 (ICD-10-CM)]  Procedural Details  Technical Details Indication: 86 yo WF with severe AS and increased dyspnea  Procedural Details: The right wrist was prepped, draped, and anesthetized with 1% lidocaine. Ultrasound was  used to guide access. Image was obtained and stored in the record. Using the modified Seldinger technique a 6 Fr slender sheath was placed in the right radial artery and a 5 French sheath was placed in the right brachial vein. A Swan-Ganz catheter was used for the right heart catheterization. Standard protocol was followed for recording of right heart pressures and sampling of oxygen saturations. Fick cardiac output was calculated. Standard Judkins catheters were used for selective coronary angiography and left ventriculography. There were no immediate procedural complications. The patient was transferred to the post catheterization recovery area for further monitoring. Contrast: 55 cc Estimated blood loss <50 mL.   During this procedure medications were administered to achieve and maintain moderate conscious sedation while the patient's heart rate, blood pressure, and oxygen saturation were  continuously monitored and I was present face-to-face 100% of this time.  Medications (Filter: Administrations occurring from 10/09/19 0725 to 10/09/19 [id number]) (important)  Continuous medications are totaled by the amount administered until 10/09/19 0828.  Heparin (Porcine) in NaCl 1000-0.9 UT/500ML-% SOLN (mL) Total volume:  1,000 mL Date/Time  Rate/Dose/Volume Action  10/09/19 0742  500 mL Given  0742  500 mL Given    fentaNYL (SUBLIMAZE) injection (mcg) Total dose:  25 mcg Date/Time  Rate/Dose/Volume Action  10/09/19 0744  25 mcg Given    midazolam (VERSED) injection (mg) Total dose:  1 mg Date/Time  Rate/Dose/Volume Action  10/09/19 0744  1 mg Given    lidocaine (PF) (XYLOCAINE) 1 % injection (mL) Total volume:  4 mL Date/Time  Rate/Dose/Volume Action  10/09/19 0750  2 mL Given  0751  2 mL Given    Radial Cocktail/Verapamil only (mL) Total volume:  10 mL Date/Time  Rate/Dose/Volume Action  10/09/19 0753  10 mL Given    heparin injection (Units) Total dose:  2,500 Units Date/Time  Rate/Dose/Volume Action  10/09/19 0802  2,500 Units Given    iohexol (OMNIPAQUE) 350 MG/ML injection (mL) Total volume:  55 mL Date/Time  Rate/Dose/Volume Action  10/09/19 0817  55 mL Given    Sedation Time  Sedation Time Physician-1: 30 minutes 32 seconds  Contrast  Medication Name Total Dose  iohexol (OMNIPAQUE) 350 MG/ML injection 55 mL    Radiation/Fluoro  Fluoro time: 4.8 (min) DAP: 5.6 (Gycm2) Cumulative Air Kerma: [id number] (mGy)  Complications  Complications documented before study signed (10/09/2019 [id number] AM)   No complications were associated with this study.  Documented by [name], Valerie M, MD - 10/09/2019 8:25 AM    Coronary Findings  Diagnostic Dominance: Right Left Main  Vessel was injected. Vessel is normal in caliber. Vessel is angiographically normal.  Left Anterior Descending  Ost LAD to Prox LAD lesion 60% stenosed  Ost LAD to Prox LAD lesion is 60% stenosed.  The lesion is segmental. The lesion is mildly calcified.  First Diagonal Branch  1st Diag lesion 60% stenosed  1st Diag lesion is 60% stenosed.  First Septal Branch  1st Sept lesion 90% stenosed  1st Sept lesion is 90% stenosed.  Left Circumflex  Vessel was injected. Vessel is normal in caliber. Vessel is angiographically normal.  Right Coronary Artery  Ost RCA to Prox RCA lesion 50% stenosed  Ost RCA to Prox RCA lesion is 50% stenosed.  Intervention  No interventions have been documented. Right Heart  Right Heart Pressures Hemodynamic findings consistent with aortic valve stenosis.  Left Heart  Left Ventricle LV end diastolic pressure is normal.  Aortic Valve There is severe aortic valve stenosis. The  aortic valve is calcified.  Coronary Diagrams  Diagnostic Dominance: Right  Intervention  Implants   No implant documentation for this case.  Syngo Images  Show images for CARDIAC CATHETERIZATION  Images on Long Term Storage  Show images for [place], [place]ro to Procedure Log  Procedure Log    Hemo Data   Most Recent Value  Fick Cardiac Output 2.89 L/min  Fick Cardiac Output Index 2.11 (L/min)/BSA  Aortic Mean Gradient 11.4 mmHg  Aortic Peak Gradient 19 mmHg  Aortic Valve Area 0.84  Aortic Value Area Index 0.61 cm2/BSA  RA A Wave 6 mmHg  RA V Wave 7 mmHg  RA Mean 5 mmHg  RV Systolic Pressure 35 mmHg  RV Diastolic Pressure 2 mmHg  RV EDP 4 mmHg  PA Systolic Pressure 37 mmHg  PA Diastolic Pressure 15 mmHg  PA Mean 24 mmHg  PW A Wave 14 mmHg  PW V Wave 21 mmHg  PW Mean 16 mmHg  AO Systolic Pressure 99991111 mmHg  AO Diastolic Pressure 75 mmHg  AO Mean [id number] mmHg  LV Systolic Pressure 123XX123 mmHg  LV Diastolic Pressure 10 mmHg  LV EDP 14 mmHg  AOp Systolic Pressure Q000111Q mmHg  AOp Diastolic Pressure 67 mmHg  AOp Mean Pressure [id number] mmHg  LVp Systolic Pressure 123XX123 mmHg  LVp Diastolic Pressure 11 mmHg  LVp EDP Pressure 13 mmHg  QP/QS 1  TPVR Index 11.37  HRUI  TSVR Index 50.68 HRUI  PVR SVR Ratio 0.08  TPVR/TSVR Ratio 0.22     Cardiac TAVR CT  TECHNIQUE: The patient was scanned on a Siemens Force [id number] slice scanner. A 120 kV retrospective scan was triggered in the descending thoracic aorta at 111 HU's. Gantry rotation speed was 270 msecs and collimation was .9 mm. No beta blockade or nitro were given. The 3D data set was reconstructed in 5% intervals of the R-R cycle. Systolic and diastolic phases were analyzed on a dedicated work station using MPR, MIP and VRT modes. The patient received 38mL OMNIPAQUE IOHEXOL 350 MG/ML SOLN of contrast.  FINDINGS: Aortic Valve: Trileaflet aortic valve. Severely reduced cusp separation. Severely thickened, severely calcified aortic valve cusps.  AV calcium score: 2626  Virtual Basal Annulus Measurements:  Maximum/Minimum Diameter: 29 x 19.4 mm  Perimeter: 78.4 mm  Area: 448 mm2  LVOT calcifications along the aorto-mitral continuity.  Based on these measurements, the annulus would be suitable for a 26 mm Sapien 3 valve.  Sinus of Valsalva Measurements:  Non-coronary: 34 mm  Right - coronary: 35 mm  Left - coronary: 36 mm  Sinus of Valsalva Height:  Left: 19.1 mm  Right: 20 mm  Aorta: Severe mixed atherosclerotic plaque in the aortic arch, left subclavian artery, brachiocephalic artery ostium, and descending thoracic aorta.  Sinotubular Junction: 31 mm  Ascending Thoracic Aorta: 40 mm  Aortic Arch: 30 mm  Descending Thoracic Aorta: 23 mm  Coronary Artery Height above Annulus:  Left Main: 16.4 mm to the LAD, 16.8 mm to the L circumflex artery  Right Coronary: 17.8 mm  Coronary Arteries: Separate ostia of the LAD and L circumflex artery.  Optimum Fluoroscopic Angle for Delivery: LAO 5, CAU 5  Filling defect in the left atrial appendage, cannot exclude left atrial appendage thrombus. No delay sequence for  comparison.  IMPRESSION: 1. Trileaflet aortic valve. Severely reduced cusp separation. Severely thickened, severely calcified aortic valve cusps.  2.  AV calcium score: 2626  3. Annulus area 448 mm2. The annulus may  be suitable for a 26 mm Sapien 3 valve.  4.  Mild ascending aorta dilation, 40 mm at mid ascending aorta.  5. Severe mixed atherosclerotic plaque in the aortic arch and descending thoracic aorta, as well as the left subclavian artery.  6. Adequate coronary artery heights. Separate ostia of the left circumflex artery and LAD.  7. Optimum Fluoroscopic Angle for Delivery: LAO 5, CAU 5  8. Contrast filling defect in the left atrial appendage, cannot exclude left atrial appendage thrombus.   Electronically Signed   By: Cherlynn Kaiser   On: 10/17/2019 01:35    CT ANGIOGRAPHY CHEST, ABDOMEN AND PELVIS  TECHNIQUE: Multidetector CT imaging through the chest, abdomen and pelvis was performed using the standard protocol during bolus administration of intravenous contrast. Multiplanar reconstructed images and MIPs were obtained and reviewed to evaluate the vascular anatomy.  CONTRAST:  60mL OMNIPAQUE IOHEXOL 350 MG/ML SOLN  COMPARISON:  03/21/2018 CT chest, abdomen and pelvis.  FINDINGS: CTA CHEST FINDINGS  Cardiovascular: Mild cardiomegaly. No significant pericardial effusion/thickening. Diffuse thickening and coarse calcification of the aortic valve. Two lead right subclavian pacemaker with lead tips in the right atrium and right ventricle. Atherosclerotic thoracic aorta with ectatic 4.0 cm ascending thoracic aorta, stable. Normal caliber main pulmonary artery. No central pulmonary emboli.  Mediastinum/Nodes: No discrete thyroid nodules. Unremarkable esophagus. No axillary adenopathy. Mildly enlarged 1.0 cm right paratracheal node (series 5/image 39) and mildly enlarged 1.1 cm subcarinal node (series 5/image 46), not appreciably changed,  most compatible with benign reactive etiology. No new pathologically enlarged mediastinal nodes. No pathologically enlarged hilar nodes.  Lungs/Pleura: No pneumothorax. Small dependent bilateral pleural effusions. Mild passive atelectasis in the dependent lower lobes. No acute consolidative airspace disease, lung masses or significant pulmonary nodules. Stable tiny calcified medial right upper lobe granuloma.  Musculoskeletal: No aggressive appearing focal osseous lesions. Marked thoracic spondylosis.  CTA ABDOMEN AND PELVIS FINDINGS  Hepatobiliary: Normal liver with no liver mass. Cholecystectomy. No significant intrahepatic biliary ductal dilatation. Mildly dilated common bile duct (10 mm diameter), stable, compatible with chronic post cholecystectomy effect.  Pancreas: Normal, with no mass or duct dilation.  Spleen: Normal size. No mass.  Adrenals/Urinary Tract: Normal adrenals. Status post left nephrectomy. No mass or fluid collection in the left nephrectomy bed. No right hydronephrosis. No contour deforming right renal masses. Bladder obscured by streak artifact from right hip hardware, with no gross bladder abnormality.  Stomach/Bowel: Small hiatal hernia involving the remnant stomach. Stable postsurgical changes from partial distal gastrectomy with intact appearing gastrojejunostomy and left abdominal enteroenterostomy. Normal caliber small bowel with no small bowel wall thickening. Appendix not discretely visualized. Normal large bowel with no diverticulosis, large bowel wall thickening or pericolonic fat stranding.  Vascular/Lymphatic: Atherosclerotic nonaneurysmal abdominal aorta. No pathologically enlarged lymph nodes in the abdomen or pelvis.  Reproductive: Status post hysterectomy, with no abnormal findings at the vaginal cuff. No adnexal mass.  Other: No pneumoperitoneum. Trace free fluid in the pelvic cul-de-sac, similar to prior CT. No focal  fluid collections.  Musculoskeletal: No aggressive appearing focal osseous lesions. Right total hip arthroplasty. Mild lumbar spondylosis.  VASCULAR MEASUREMENTS PERTINENT TO TAVR:  AORTA:  Minimal Aortic Diameter-11.7 x 11.0 mm  Severity of Aortic Calcification-severe  RIGHT PELVIS:  Right Common Iliac Artery -  Minimal Diameter-6.4 x 5.6 mm  Tortuosity-mild-to-moderate  Calcification-severe  Right External Iliac Artery -  Minimal Diameter-3.3 x 1.7 mm  Tortuosity-mild-to-moderate  Calcification-severe  Right Common Femoral Artery -  Minimal Diameter-6.1 x 5.7 mm  Tortuosity-mild  Calcification-moderate  LEFT PELVIS:  Left Common Iliac Artery -  Minimal Diameter-7.8 x 6.8 mm  Tortuosity-moderate  Calcification-moderate  Left External Iliac Artery -  Minimal Diameter-4.9 x 4.6 mm  Tortuosity-moderate  Calcification-severe  Left Common Femoral Artery -  Minimal Diameter-7.0 x 6.4 mm  Tortuosity-mild-to-moderate  Calcification-mild  Review of the MIP images confirms the above findings.  IMPRESSION: 1. Vascular findings and measurements pertinent to potential TAVR procedure, as detailed. Note made of severe aortic and iliofemoral atherosclerosis with multifocal high-grade stenoses in the right external iliac artery. 2. Diffuse thickening and coarse calcification of the aortic valve, compatible with the provided history of severe symptomatic aortic stenosis. 3. Small dependent bilateral pleural effusions. 4. Mild cardiomegaly. 5. Stable ectatic 4.0 cm ascending thoracic aorta. Recommend annual imaging followup by CTA or MRA. This recommendation follows 2010 ACCF/AHA/AATS/ACR/ASA/SCA/SCAI/SIR/STS/SVM Guidelines for the Diagnosis and Management of Patients with Thoracic Aortic Disease. Circulation. 2010; [id number]. Aortic aneurysm NOS (ICD10-I71.9). 6. Stable extensive postsurgical changes from prior  cholecystectomy, hysterectomy, distal gastrectomy and left nephrectomy. 7. Aortic Atherosclerosis (ICD10-I70.0).   Electronically Signed   By: Ilona Sorrel M.D.   On: 10/16/2019 14:08     Impression:  Patient has stage D severe symptomatic paradoxical low flow low gradient aortic stenosis.  She describes worsening symptoms of exertional shortness of breath, orthopnea, and occasional PND consistent with chronic diastolic congestive heart failure, New [place] Heart Association functional class III which have steadily progressed over the past 1 to 2 years and been associated with the development of persistent atrial fibrillation.  I have personally reviewed the patient's recent transthoracic echocardiogram, diagnostic cardiac catheterization, and CT angiograms.  Transthoracic echocardiogram reveals features consistent with paradoxical low-flow low gradient severe aortic stenosis.  The aortic valve is trileaflet with severe thickening, calcification, and restricted leaflet mobility involving all 3 leaflets.  The patient has relatively low stroke-volume and stroke-volume index with peak velocity across a aortic valve varying depend on RR interval and ranging as high as 3.3 to 3.4 m/s corresponding to mean transvalvular gradient estimated 25 mmHg and aortic valve area calculated only 0.60 cm.  Diagnostic cardiac catheterization revealed similar findings and was notable for the absence of significant coronary artery disease.    I agree the patient would benefit from aortic valve replacement.  However, I would not consider this elderly patient with numerous comorbid medical problems to be candidate for conventional surgery under any circumstances, particularly given the fact that the patient is a member of the [employer] and will not accept blood transfusion.  Cardiac-gated CTA of the heart reveals anatomical characteristics consistent with aortic stenosis suitable for treatment by  transcatheter aortic valve replacement without any significant complicating features.  However, the patient does not have sufficient pelvic vascular access to facilitate a transfemoral approach.  I favor transcatheter aortic valve replacement via either left carotid or left subclavian approach.    Plan:  The patient and her daughter were counseled at length regarding treatment alternatives for management of severe symptomatic aortic stenosis. Alternative approaches such as conventional aortic valve replacement, transcatheter aortic valve replacement, and continued medical therapy without intervention were compared and contrasted at length.  The risks associated with conventional surgical aortic valve replacement were discussed in detail, as were expectations for post-operative convalescence, and why I would not consider this patient a candidate for conventional surgery.  Issues specific to transcatheter aortic valve replacement were discussed including questions about long term valve durability, the potential for paravalvular leak,  possible increased risk of need for permanent pacemaker placement, and other technical complications related to the procedure itself.  Long-term prognosis with medical therapy was discussed. This discussion was placed in the context of the patient's own specific clinical presentation and past medical history.  All of their questions have been addressed.  The patient desires to proceed with transcatheter aortic valve replacement in the near future.  We tentatively plan to proceed on October 30, 2019.  Following the decision to proceed with transcatheter aortic valve replacement, a discussion has been held regarding what types of management strategies would be attempted intraoperatively in the event of life-threatening complications, including whether or not the patient would be considered a candidate for the use of cardiopulmonary bypass and/or conversion to open sternotomy for  attempted surgical intervention.  The patient specifically requests that should a potentially life-threatening complication develop we would not attempt emergency median sternotomy and/or other aggressive surgical procedures.  The patient has been advised of a variety of complications that might develop including but not limited to risks of death, stroke, paravalvular leak, aortic dissection or other major vascular complications, aortic annulus rupture, device embolization, cardiac rupture or perforation, mitral regurgitation, acute myocardial infarction, arrhythmia, heart block or bradycardia requiring permanent pacemaker placement, congestive heart failure, respiratory failure, renal failure, pneumonia, infection, other late complications related to structural valve deterioration or migration, or other complications that might ultimately cause a temporary or permanent loss of functional independence or other long term morbidity.  The patient provides full informed consent for the procedure as described and all questions were answered.    I spent in excess of 90 minutes during the conduct of this office consultation and >50% of this time involved direct face-to-face encounter with the patient for counseling and/or coordination of their care.   Valentina Gu. Roxy Manns, MD 10/22/2019 9:54 AM

## 2019-10-22 NOTE — Progress Notes (Signed)
HE[facility] AND [facility] CONSULTATION REPORT  Primary Cardiologist is [name], MD PCP is [name], MD  Chief Complaint  Patient presents with  . Aortic Stenosis    TAVR EVAL...review all required studies and procedures    HPI:  Patient is an 86 year old female with history of aortic stenosis, chronic diastolic congestive heart failure, paroxysmal atrial fibrillation on long-term warfarin anticoagulation, tachybradycardia syndrome status post permanent pacemaker placement, hypertension, hyperlipidemia, previous stroke, gastric cancer status post surgical resection, renal cell carcinoma status post nephrectomy, stage III chronic kidney disease, anemia, and bladder cancer treated with vesicular chemotherapy who has been referred for surgical consultation to discuss treatment options for management of severe paradoxical low flow low gradient aortic stenosis.  Patient has a longstanding history of chronic diastolic congestive heart failure and atrial fibrillation.  She was followed for many years by Dr. Mare Ferrari and more recently by Dr. [name].  She underwent permanent pacemaker implantation in the distant past for tachybradycardia syndrome and has been on long-term warfarin anticoagulation for many years.  She has developed progressive exertional shortness of breath, fatigue, and fluid retention over the past 2 years.  She has required escalating doses of diuretic therapy and she has been staying in persistent atrial fibrillation.  Follow-up transthoracic echocardiogram performed September 17, 2019 findings consistent with paradoxical low flow low gradient severe aortic stenosis.  Ejection fraction was estimated 60 to 65%.  The aortic valve was severely calcified with severely restricted leaflet motion.  Peak velocity across aortic valve measured 3.3 m/s corresponding to mean transvalvular gradient estimated 25 mmHg.  The DVI  was notably quite low at 0.21 with aortic valve area calculated 0.60 cm and stroke-volume only 43 mL with stroke-volume index 31 mL/m.  The patient was referred to the multidisciplinary heart valve clinic and has been evaluated previously by Dr. Angelena Form.  Diagnostic cardiac catheterization performed by Dr. [name] on October 09, 2019 revealed nonobstructive coronary artery disease.  Peak to peak and mean transvalvular gradients across aortic valve were measured 19 and 11.4 mmHg respectively corresponding to aortic valve area calculated only 0.84 cm.  CT angiography was performed of the patient was referred for surgical consultation.  Patient is widowed and lives locally with her daughter in [place].  She has some difficulty walking and currently ambulates short distances using a walker for assistance.  She states that her walking has gotten a bit worse with increased weakness in her legs since her diagnostic catheterization.  However, she has had chronic problems in her right leg dating back many years related to multiple previous surgical procedures.  She gets short of breath with low-level activity and occasionally at rest.  She gets tightness across her chest with exertion.  She cannot lay flat in bed.  She has frequent palpitations.  She is not having dizzy spells or syncope.  She reports occasional mild swelling in her legs.  She is a member of the [employer] and will not accept blood products.  She has been on long-term warfarin anticoagulation for many years without significant bleeding complications.  She has history of stroke in the remote past manifest as visual field deficit in left visual field  Past Medical History:  Diagnosis Date  . Adenocarcinoma of stomach ([place]) 11/11/2015   gastric mass on egd  . Anal fissure   . Anemia    hx 3/17 iron infusion, on aranesp and injected B12 since 11/2015.   [name] Anxiety   .  Aortic root dilatation ([place])   . Arthritis    oa  . 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 CAD (coronary artery disease)   . Chronic kidney disease    only has one kidney rt lft rem 03 ca     dr. Risa Grill, bladder cancer 05-04-2018  . Elevated LFTs    hepatic steatosis, none recent  . Gastroparesis   . GERD (gastroesophageal reflux disease)   . History of uterine cancer 1980s   treated with hysterectomy, external radiation and radiation seed implants.   [name] HTN (hypertension)   . Hyperlipemia   . Hypothyroidism   . Iron deficiency anemia due to chronic blood loss 11/24/2015  . LVH (left ventricular hypertrophy) 11/14/2015   Mild, noted on ECHO  . Neuropathy    right leg  . Osteomyelitis ([place]) as child  . PAF (paroxysmal atrial fibrillation) ([place])   . Presence of permanent cardiac pacemaker   . Radiation proctitis   . Restless leg syndrome   . Severe aortic stenosis   . Stroke ([place]) 15   partial stroke left legally blind; left eye   . Tachycardia-bradycardia syndrome ([place])    s/p PPM by Dr Doreatha Lew (MDT) 07/03/10    Past Surgical History:  Procedure Laterality Date  . CARDIAC CATHETERIZATION  2008  . CHOLECYSTECTOMY  1970s  . COLONOSCOPY N/A 11/11/2015   Procedure: COLONOSCOPY;  Surgeon: [name], MD;  [facility];  Service: Endoscopy;  Laterality: N/A;  . CYSTOSCOPY W/ RETROGRADES Right 05/01/2018   Procedure: CYSTOSCOPY WITH RIGHT RETROGRADE PYELOGRAM;  Surgeon: Lucas Mallow, MD;  [facility];  Service: Urology;  Laterality: Right;  . CYSTOSCOPY W/ RETROGRADES Right 07/21/2018   Procedure: CYSTOSCOPY WITH RIGHT RETROGRADE PYELOGRAM;  Surgeon: Alexis Frock, MD;  [facility];  Service: Urology;  Laterality: Right;  . ESOPHAGOGASTRODUODENOSCOPY N/A 11/11/2015   Procedure: ESOPHAGOGASTRODUODENOSCOPY (EGD);  Surgeon: [name], MD;  Location: [facility];  Service: Endoscopy;  Laterality: N/A;  . ESOPHAGOGASTRODUODENOSCOPY N/A 02/05/2016   Procedure: ESOPHAGOGASTRODUODENOSCOPY (EGD);  Surgeon: Ladene Artist, MD;   Location: [facility];  Service: Endoscopy;  Laterality: N/A;  . ESOPHAGOGASTRODUODENOSCOPY N/A 04/02/2016   Procedure: ESOPHAGOGASTRODUODENOSCOPY (EGD);  Surgeon: [name], MD;  Location: [facility];  Service: Endoscopy;  Laterality: N/A;  . ESOPHAGOGASTRODUODENOSCOPY (EGD) WITH PROPOFOL N/A 04/06/2017   Procedure: ESOPHAGOGASTRODUODENOSCOPY (EGD) WITH PROPOFOL;  Surgeon: [name], MD;  [facility];  Service: Endoscopy;  Laterality: N/A;  . GASTRECTOMY N/A 01/15/2016   Procedure: OPEN PARTIAL GASTRECTOMY;  Surgeon: Stark Klein, MD;  Location: [facility];  Service: General;  Laterality: N/A;  . GASTRECTOMY Left 02/24/2016   Procedure: OPEN J TUBE;  Surgeon: Stark Klein, MD;  Location: [facility];  Service: General;  Laterality: Left;  [name] GASTROJEJUNOSTOMY N/A 05/18/2016   Procedure: ROUX EN [place];  Surgeon: Stark Klein, MD;  Location: [place];  Service: General;  Laterality: N/A;  . I & D KNEE WITH POLY EXCHANGE Right 12/17/2013   Procedure: IRRIGATION AND DEBRIDEMEN RIGHT TOTAL  KNEE WITH POLY EXCHANGE;  Surgeon: Mauri Pole, MD;  [facility];  Service: Orthopedics;  Laterality: Right;  . IR GENERIC HISTORICAL  06/17/2016   IR RADIOLOGIST EVAL & MGMT 06/17/2016 Ascencion Dike, PA-C [facility] INTERV RAD  . LAPAROSCOPY N/A 01/15/2016   Procedure: LAPAROSCOPY DIAGNOSTIC;  Surgeon: Stark Klein, MD;  Location: [place] [facility];  Service: General;  Laterality: N/A;  . LYSIS OF ADHESION  05/18/2016   Procedure:  LYSIS OF ADHESION;  Surgeon: Stark Klein, MD;  Location: [facility];  Service: General;;  . NEPHRECTOMY Left    renal cell cancer  . PACEMAKER INSERTION  07/04/2011   MDT PPM implanted by Dr Doreatha Lew for tachy/brady; managed by Dr Thompson Grayer   . PATELLAR TENDON REPAIR Right 03/06/2014   Procedure: AVULSION WITH PATELLA TENDON REPAIR;  Surgeon: Mauri Pole, MD;  [facility];  Service: Orthopedics;  Laterality: Right;  . PATENT DUCTUS ARTERIOUS REPAIR  ~ 1949  . RIGHT/LEFT  HEART CATH AND CORONARY ANGIOGRAPHY N/A 10/09/2019   Procedure: RIGHT/LEFT HEART CATH AND CORONARY ANGIOGRAPHY;  Surgeon: [name], [name], MD;  Location: [facility];  Service: Cardiovascular;  Laterality: N/A;  . TOTAL ABDOMINAL HYSTERECTOMY  85  . TOTAL HIP REVISION Right 2009   initial replacment surg  . TOTAL KNEE ARTHROPLASTY  02/07/2012   Procedure: TOTAL KNEE ARTHROPLASTY;  Surgeon: Mauri Pole, MD;  [facility];  Service: Orthopedics;  Laterality: Right;  . TOTAL KNEE REVISION Right 07/29/2014   Procedure: RIGHT KNEE REVISION OF PREVIOUS REPAIR EXTENSOR MECHANISM;  Surgeon: Mauri Pole, MD;  [facility];  Service: Orthopedics;  Laterality: Right;  . TRANSURETHRAL RESECTION OF BLADDER TUMOR N/A 07/21/2018   Procedure: TRANSURETHRAL RESECTION OF BLADDER TUMOR (TURBT);  Surgeon: Alexis Frock, MD;  [facility];  Service: Urology;  Laterality: N/A;  . TRANSURETHRAL RESECTION OF BLADDER TUMOR WITH MITOMYCIN-C N/A 05/01/2018   Procedure: TRANSURETHRAL RESECTION OF BLADDER TUMOR WITH POST OP INSTILLATION OF GEMCITABINE;  Surgeon: Lucas Mallow, MD;  [facility];  Service: Urology;  Laterality: N/A;  . WHIPPLE PROCEDURE N/A 05/18/2016   Procedure: EXPLORATORY LAPAROTOMY;  Surgeon: Stark Klein, MD;  [facility];  Service: General;  Laterality: N/A;    Family History  Problem Relation Age of Onset  . Colon cancer Mother 6  . Heart disease Mother   . Heart disease Father   . Heart attack Father   . Heart disease Maternal Grandmother     Social History   Socioeconomic History  . Marital status: Widowed    Spouse name: Not on file  . Number of children: 3  . Years of education: Not on file  . Highest education level: Not on file  Occupational History  . Not on file  Tobacco Use  . Smoking status: Never Smoker  . Smokeless tobacco: Never Used  Substance and Sexual Activity  . Alcohol use: No  . Drug use: No  . Sexual activity: Never  Other  Topics Concern  . Not on file  Social History Narrative   01/01/19 Lives in [place] w/dgtr, [name].  Continues to work.   Social Determinants of Health   Financial Resource Strain:   . Difficulty of Paying Living Expenses: Not on file  Food Insecurity:   . Worried About Charity fundraiser in the Last Year: Not on file  . Ran Out of Food in the Last Year: Not on file  Transportation Needs:   . Lack of Transportation (Medical): Not on file  . Lack of Transportation (Non-Medical): Not on file  Physical Activity:   . Days of Exercise per Week: Not on file  . Minutes of Exercise per Session: Not on file  Stress:   . Feeling of Stress : Not on file  Social Connections:   . Frequency of Communication with Friends and Family: Not on file  . Frequency of Social Gatherings with Friends  and Family: Not on file  . Attends Religious Services: Not on file  . Active Member of Clubs or Organizations: Not on file  . Attends Archivist Meetings: Not on file  . Marital Status: Not on file  Intimate Partner Violence:   . Fear of Current or Ex-Partner: Not on file  . Emotionally Abused: Not on file  . Physically Abused: Not on file  . Sexually Abused: Not on file    Current Outpatient Medications  Medication Sig Dispense Refill  . acetaminophen (TYLENOL) 500 MG tablet Take 500-1,000 mg by mouth every 6 (six) hours as needed for mild pain or headache.     . ALPRAZolam (XANAX) 0.5 MG tablet Take 0.5 mg by mouth 2 (two) times daily as needed for anxiety.     . Alum & Mag Hydroxide-Simeth (MYLANTA PO) Take 20 mLs by mouth every evening.     . Ascorbic Acid (VITAMIN C) 1000 MG tablet Take 1,000 mg by mouth daily.    . Biotin 10000 MCG TBDP Take 10,000 mcg by mouth daily.    . Cholecalciferol (VITAMIN D-3) 5000 units TABS Take 5,000 Units by mouth 3 (three) times daily.     [name] enoxaparin (LOVENOX) 60 MG/0.6ML injection Inject 0.6 mLs (60 mg total) into the skin daily. 6 mL 1  .  erythromycin ophthalmic ointment Place 1 application into the left eye at bedtime.    . famotidine (PEPCID) 40 MG tablet Take 1 tablet Daily for Indigestion & Heartburn (Patient taking differently: Take 40 mg by mouth daily. ) 90 tablet 3  . ferrous sulfate 325 (65 FE) MG tablet Take 325 mg by mouth daily in the afternoon.    . furosemide (LASIX) 20 MG tablet Take 20 mg by mouth daily as needed (fluid retention).     . gabapentin (NEURONTIN) 300 MG capsule TAKE 1 CAPSULE BY MOUTH THREE TIMES A DAY 270 capsule 1  . levothyroxine (SYNTHROID) 100 MCG tablet Take 1/2 tablet daily for thyroid. (Patient taking differently: Take 50 mcg by mouth daily before breakfast. ) 45 tablet 1  . loperamide (IMODIUM A-D) 2 MG tablet Take up to 12 tablets /day as needed for  for Diarrhea (Patient taking differently: Take 2-4 mg by mouth 4 (four) times daily as needed for diarrhea or loose stools. ) 120 tablet 11  . loratadine (CLARITIN) 10 MG tablet Take 10 mg by mouth at bedtime.     [name] losartan (COZAAR) 25 MG tablet Take 2 tablets (50 mg total) by mouth daily. (Patient taking differently: Take 50 mg by mouth every evening. ) 360 tablet 1  . metoprolol tartrate (LOPRESSOR) 100 MG tablet Take 1 tablet 2 x /day for BP (Patient taking differently: Take 100 mg by mouth 2 (two) times daily. ) 30 tablet 6  . nitroGLYCERIN (NITROSTAT) 0.4 MG SL tablet Dissolve 1 tablet under tongue every 5 minutes if needed for Angina 50 tablet prn  . ondansetron (ZOFRAN-ODT) 8 MG disintegrating tablet Take 8 mg by mouth every 8 (eight) hours as needed for nausea or vomiting.     . potassium chloride (KLOR-CON) 10 MEQ tablet Take 1 tablet (10 mEq total) by mouth daily. (Patient taking differently: Take 10 mEq by mouth daily as needed (with lasix). ) 90 tablet 1  . Propylene Glycol (SYSTANE COMPLETE) 0.6 % SOLN Place 1 drop into both eyes 3 (three) times daily as needed (irritation/dry eyes.).    [name] simethicone (MYLICON) 80 MG chewable tablet Chew  80 mg  by mouth every 6 (six) hours as needed for flatulence.    . sucralfate (CARAFATE) 1 g tablet TAKE 1 TABLET (1 G TOTAL) BY MOUTH 4 (FOUR) TIMES DAILY - WITH MEALS AND AT BEDTIME. 360 tablet 3  . tobramycin-dexamethasone (TOBRADEX) ophthalmic solution Place 1 drop into the left eye 3 (three) times daily.    [name] triamcinolone cream (KENALOG) 0.1 % Apply 1 application topically 2 (two) times daily as needed (rash/skin irritation.).     [name] verapamil (CALAN-SR) 120 MG CR tablet Take 1 tablet 2 x /day with a meal for BP & Heart Rhythm (Patient taking differently: Take 120 mg by mouth 2 (two) times daily. ) 180 tablet 3  . warfarin (COUMADIN) 2 MG tablet TAKE 1 TO 2 TABLETS AS DIRECTED TO PREVENT BLOOD CLOTS (Patient taking differently: Take 2 mg by mouth every evening. ) 180 tablet 1   No current facility-administered medications for this visit.    Allergies  Allergen Reactions  . Adhesive [Tape] Other (See Comments)    Tears skin, Please use "paper" tape  . Amiodarone Other (See Comments)     Thyroid and liver and kidney problems  . Hydrocodone Other (See Comments)    HALLLUCINATIONS  . Remeron [Mirtazapine] Other (See Comments)    Hallucinations and make pt loopy  . Codeine Nausea And Vomiting  . Morphine And Related Nausea And Vomiting  . Reglan [Metoclopramide] Hives, Itching and Rash  . Requip [Ropinirole Hcl] Other (See Comments)    Headache   . Zinc Nausea Only  . Other     NO BLOOD PRODUCTS      Review of Systems:   General:  normal appetite, decreased energy, no weight gain, no weight loss, no fever  Cardiac:  + chest pain with exertion, no chest pain at rest, +SOB with exertion, + occasional resting SOB, + PND, + orthopnea, + palpitations, + arrhythmia, + atrial fibrillation, + LE edema, NO dizzy spells, no syncope  Respiratory:  + exertional shortness of breath, no home oxygen, no productive cough, no dry cough, no bronchitis, no wheezing, no hemoptysis, no asthma, no pain  with inspiration or cough, no sleep apnea, no CPAP at night  GI:   no difficulty swallowing, + reflux, no frequent heartburn, no hiatal hernia, no abdominal pain, no constipation, occasional diarrhea, no hematochezia, no hematemesis, no melena  GU:   no dysuria,  no frequency, no urinary tract infection, no hematuria, no kidney stones, + kidney disease  Vascular:  + pain suggestive of claudication, no pain in feet, no leg cramps, no varicose veins, no DVT, no non-healing foot ulcer  Neuro:   no stroke, no TIA's, no seizures, no headaches, no temporary blindness one eye,  no slurred speech, + peripheral neuropathy, no chronic pain, + instability of gait, no memory/cognitive dysfunction  Musculoskeletal: + arthritis, no joint swelling, no myalgias, + difficulty walking, limited mobility   Skin:   no rash, no itching, no skin infections, no pressure sores or ulcerations  Psych:   no anxiety, no depression, no nervousness, no unusual recent stress  Eyes:   no blurry vision, no floaters, no recent vision changes, + wears glasses or contacts  ENT:   no hearing loss, no loose or painful teeth, + partial dentures, last saw dentist 2020  Hematologic:  + easy bruising, no abnormal bleeding, no clotting disorder, no frequent epistaxis  Endocrine:  no diabetes, does not check CBG's at home  Physical Exam:   BP (!) 165/77 (BP Location: Right Arm, Patient Position: Sitting, Cuff Size: Normal)   Pulse 83   Temp 97.7 F (36.5 C)   Resp 16   Ht 4\' 11"  (1.499 m)   Wt 99 lb 3.2 oz (45 kg)   SpO2 97% Comment: RA  BMI 20.04 kg/m   General:  Elderly and frail-appearing  HEENT:  Unremarkable   Neck:   no JVD, no bruits, no adenopathy   Chest:   clear to auscultation, symmetrical breath sounds, no wheezes, no rhonchi   CV:   RRR, grade III/VI crescendo/decrescendo murmur heard best at LLSB,  no diastolic murmur  Abdomen:  soft, non-tender, no masses   Extremities:  warm, well-perfused, pulses  diminished, no LE edema  Rectal/GU  Deferred  Neuro:   Grossly non-focal and symmetrical throughout  Skin:   Clean and dry, no rashes, no breakdown   Diagnostic Tests:  ECHOCARDIOGRAM REPORT       Patient Name:  [name] Drakes Date of Exam: 09/17/2019  Medical Rec #: [id number]    Height:    59.0 in  Accession #:  [id number]    Weight:    99.6 lb  Date of Birth: [birth year]     BSA:     1.37 m  Patient Age:  38 years     BP:      174/84 mmHg  Patient Gender: F        HR:      84 bpm.  Exam Location: [address]   Procedure: 2D Echo, 3D Echo, Cardiac Doppler and Color Doppler   Indications:  I35.0 Aortic Stenosis    History:    Patient has prior history of Echocardiogram examinations,  most         recent 11/14/2015. Pacemaker; Risk Factors:Hypertension and         Dyslipidemia. Tachy-Brady Syndrome. Stroke. Paroxysmal  atrial         fibrillation. LVH. Hypothyroidism. Chronic kidney disease.         Coronary artery disease. Aorta root dilatation.    Sonographer:  Vesper  Referring Phys: [id number] [place]    1. Left ventricular ejection fraction, by visual estimation, is 60 to  65%. The left ventricle has normal function. There is moderately increased  left ventricular hypertrophy.  2. Left ventricular diastolic function could not be evaluated.  3. Small left ventricular internal cavity size.  4. The left ventricle has no regional wall motion abnormalities.  5. Global right ventricle has normal systolic function.The right  ventricular size is normal. No increase in right ventricular wall  thickness.  6. Left atrial size was moderately dilated.  7. The mitral valve is degenerative. Mild mitral valve regurgitation.  8. The tricuspid valve is grossly normal.  9. The aortic valve is severely calcified with restricted leaflet  motion.  Vmax 3.3 m/s, mean gradient 25 mmHG, DI 0.21, AVA 0.60 cm2. SV=43 cc and  SVi=31 cc/m2. Suspect there is paradoxical low-flow low-gradient severe  aortic stenosis. Moderate aortic  regurgitation is present. Recommend an aortic valve calcium score for  clarification of AoV stenosis severity.  10. Aortic valve regurgitation is moderate.  11. The aortic valve is tricuspid. Aortic valve regurgitation is moderate.  Moderate to severe aortic valve stenosis.  12. Mildly elevated pulmonary artery systolic pressure.  13. The tricuspid regurgitant velocity is 2.68 m/s, and with an assumed  right atrial pressure of 15 mmHg, the  estimated right ventricular systolic  pressure is mildly elevated at 43.8 mmHg.  14. A pacer wire is visualized in the RA and RV.  15. The inferior vena cava is dilated in size with <50% respiratory  variability, suggesting right atrial pressure of 15 mmHg.   In comparison to the previous echocardiogram(s): EF unchanged. Concerns  for paradoxical low-flow low-gradient severe AS on this study. Gradients  are higher compared with prior study (11/14/2015).  FINDINGS  Left Ventricle: Left ventricular ejection fraction, by visual estimation,  is 60 to 65%. The left ventricle has normal function. The left ventricle  has no regional wall motion abnormalities. The left ventricular internal  cavity size was the LV cavity  size is small. There is moderately increased left ventricular hypertrophy.  Concentric left ventricular hypertrophy. The left ventricular diastology  could not be evaluated due to atrial fibrillation. Left ventricular  diastolic function could not be  evaluated.   Right Ventricle: The right ventricular size is normal. No increase in  right ventricular wall thickness. Global RV systolic function is has  normal systolic function. The tricuspid regurgitant velocity is 2.68 m/s,  and with an assumed right atrial pressure  of 15 mmHg, the estimated right  ventricular systolic pressure is mildly  elevated at 43.8 mmHg.   Left Atrium: Left atrial size was moderately dilated.   Right Atrium: Right atrial size was mild-moderately dilated   Pericardium: There is no evidence of pericardial effusion.   Mitral Valve: The mitral valve is degenerative in appearance. Moderate  mitral annular calcification. Mild mitral valve regurgitation. MV peak  gradient, 8.1 mmHg.   Tricuspid Valve: The tricuspid valve is grossly normal. Tricuspid valve  regurgitation is mild.   Aortic Valve: The aortic valve is tricuspid. Aortic valve regurgitation is  moderate. Aortic regurgitation PHT measures 290 msec. Moderate to severe  aortic stenosis is present. Moderate to severe aortic valve annular  calcification. Aortic valve mean  gradient measures 25.2 mmHg. Aortic valve peak gradient measures 44.2  mmHg. Aortic valve area, by VTI measures 0.60 cm. The aortic valve is  severely calcified with restricted leaflet motion. Vmax 3.3 m/s, mean  gradient 25 mmHG, DI 0.21, AVA 0.60 cm2.  SV=43 cc and SVi=31 cc/m2. Suspect there is paradoxical low-flow  low-gradient severe aortic stenosis. Moderate aortic regurgitation is  present. Recommend an aortic valve calcium score for clarification of AoV  stenosis severity.   Pulmonic Valve: The pulmonic valve was grossly normal. Pulmonic valve  regurgitation is mild. Pulmonic regurgitation is mild.   Aorta: The aortic root and ascending aorta are structurally normal, with  no evidence of dilitation.   Venous: The inferior vena cava is dilated in size with less than 50%  respiratory variability, suggesting right atrial pressure of 15 mmHg.   IAS/Shunts: No atrial level shunt detected by color flow Doppler.   Additional Comments: A pacer wire is visualized in the right atrium and  right ventricle.    LEFT VENTRICLE  PLAX 2D  LVIDd:     3.20 cm Diastology  LVIDs:     1.97 cm LV e' lateral:  8.81 cm/s  LV  PW:     1.29 cm LV E/e' lateral: 14.6  LV IVS:    1.20 cm LV e' medial:  5.66 cm/s  LVOT diam:   1.90 cm LV E/e' medial: 22.8  LV SV:     29 ml  LV SV Index:  20.81  LVOT Area:   2.84 cm  RIGHT VENTRICLE  RV S prime:   9.46 cm/s  TAPSE (M-mode): 1.6 cm   LEFT ATRIUM       Index    RIGHT ATRIUM      Index  LA diam:    3.75 cm 2.73 cm/m RA Area:   20.80 cm  LA Vol (A2C):  64.3 ml 46.89 ml/m RA Volume:  65.40 ml 47.69 ml/m  LA Vol (A4C):  59.7 ml 43.53 ml/m  LA Biplane Vol: 67.7 ml 49.37 ml/m  AORTIC VALVE  AV Area (Vmax):  0.66 cm  AV Area (Vmean):  0.64 cm  AV Area (VTI):   0.60 cm  AV Vmax:      332.42 cm/s  AV Vmean:     233.799 cm/s  AV VTI:      0.719 m  AV Peak Grad:   44.2 mmHg  AV Mean Grad:   25.2 mmHg  LVOT Vmax:     76.97 cm/s  LVOT Vmean:    52.486 cm/s  LVOT VTI:     0.152 m  LVOT/AV VTI ratio: 0.21  AI PHT:      290 msec    AORTA  Ao Root diam: 3.65 cm  Ao Asc diam: 4.00 cm   MITRAL VALVE             TRICUSPID VALVE  MV Area (PHT): 3.39 cm       TR Peak grad:  28.8 mmHg  MV Peak grad: 8.1 mmHg       TR Vmax:    296.00 cm/s  MV Mean grad: 2.0 mmHg  MV Vmax:    1.42 m/s       SHUNTS  MV Vmean:   61.6 cm/s       Systemic VTI: 0.15 m  MV VTI:    0.28 m        Systemic Diam: 1.90 cm  MV PHT:    64.96 msec  MV Decel Time: 224 msec  MR Peak grad: 145.4 mmHg  MR Mean grad: 89.0 mmHg  MR Vmax:   603.00 cm/s  MR Vmean:   449.0 cm/s  MV E velocity: 129.00 cm/s 103 cm/s  MV A velocity: 37.80 cm/s 70.3 cm/s  MV E/A ratio: 3.41    1.5     Eleonore Chiquito MD  Electronically signed by Eleonore Chiquito MD  Signature Date/Time: 09/17/2019/5:35:43 PM      RIGHT/LEFT HEART CATH AND CORONARY ANGIOGRAPHY  Conclusion    Ost LAD to Prox LAD lesion is 60% stenosed.  1st Sept  lesion is 90% stenosed.  1st Diag lesion is 60% stenosed.  Ost RCA to Prox RCA lesion is 50% stenosed.  LV end diastolic pressure is normal.  There is severe aortic valve stenosis.  Hemodynamic findings consistent with aortic valve stenosis.   1. Moderate nonobstructive CAD except in a small septal perforator branch 2. Severe low flow/low gradient aortic stenosis. AVA 0.84 with index 0.61.  Mean gradient 11 mmHg 3. Normal right heart pressures 4. Reduced cardiac output with index 2.11.  5. Normal LV filling pressures.   Plan: proceed with evaluation for TAVR.    Recommendations  Antiplatelet/Anticoag Recommend to resume Warfarin, at currently prescribed dose and frequency on 10/09/2019. Concurrent antiplatelet therapy not recommended.  Indications  Severe aortic stenosis [I35.0 (ICD-10-CM)]  Procedural Details  Technical Details Indication: 86 yo WF with severe AS and increased dyspnea  Procedural Details: The right wrist was prepped, draped, and anesthetized with 1% lidocaine. Ultrasound was  used to guide access. Image was obtained and stored in the record. Using the modified Seldinger technique a 6 Fr slender sheath was placed in the right radial artery and a 5 French sheath was placed in the right brachial vein. A Swan-Ganz catheter was used for the right heart catheterization. Standard protocol was followed for recording of right heart pressures and sampling of oxygen saturations. Fick cardiac output was calculated. Standard Judkins catheters were used for selective coronary angiography and left ventriculography. There were no immediate procedural complications. The patient was transferred to the post catheterization recovery area for further monitoring. Contrast: 55 cc Estimated blood loss <50 mL.   During this procedure medications were administered to achieve and maintain moderate conscious sedation while the patient's heart rate, blood pressure, and oxygen saturation were  continuously monitored and I was present face-to-face 100% of this time.  Medications (Filter: Administrations occurring from 10/09/19 0725 to 10/09/19 [id number]) (important)  Continuous medications are totaled by the amount administered until 10/09/19 0828.  Heparin (Porcine) in NaCl 1000-0.9 UT/500ML-% SOLN (mL) Total volume:  1,000 mL Date/Time  Rate/Dose/Volume Action  10/09/19 0742  500 mL Given  0742  500 mL Given    fentaNYL (SUBLIMAZE) injection (mcg) Total dose:  25 mcg Date/Time  Rate/Dose/Volume Action  10/09/19 0744  25 mcg Given    midazolam (VERSED) injection (mg) Total dose:  1 mg Date/Time  Rate/Dose/Volume Action  10/09/19 0744  1 mg Given    lidocaine (PF) (XYLOCAINE) 1 % injection (mL) Total volume:  4 mL Date/Time  Rate/Dose/Volume Action  10/09/19 0750  2 mL Given  0751  2 mL Given    Radial Cocktail/Verapamil only (mL) Total volume:  10 mL Date/Time  Rate/Dose/Volume Action  10/09/19 0753  10 mL Given    heparin injection (Units) Total dose:  2,500 Units Date/Time  Rate/Dose/Volume Action  10/09/19 0802  2,500 Units Given    iohexol (OMNIPAQUE) 350 MG/ML injection (mL) Total volume:  55 mL Date/Time  Rate/Dose/Volume Action  10/09/19 0817  55 mL Given    Sedation Time  Sedation Time Physician-1: 30 minutes 32 seconds  Contrast  Medication Name Total Dose  iohexol (OMNIPAQUE) 350 MG/ML injection 55 mL    Radiation/Fluoro  Fluoro time: 4.8 (min) DAP: 5.6 (Gycm2) Cumulative Air Kerma: [id number] (mGy)  Complications  Complications documented before study signed (10/09/2019 [id number] AM)   No complications were associated with this study.  Documented by [name], [name], MD - 10/09/2019 8:25 AM    Coronary Findings  Diagnostic Dominance: Right Left Main  Vessel was injected. Vessel is normal in caliber. Vessel is angiographically normal.  Left Anterior Descending  Ost LAD to Prox LAD lesion 60% stenosed  Ost LAD to Prox LAD lesion is 60% stenosed.  The lesion is segmental. The lesion is mildly calcified.  First Diagonal Branch  1st Diag lesion 60% stenosed  1st Diag lesion is 60% stenosed.  First Septal Branch  1st Sept lesion 90% stenosed  1st Sept lesion is 90% stenosed.  Left Circumflex  Vessel was injected. Vessel is normal in caliber. Vessel is angiographically normal.  Right Coronary Artery  Ost RCA to Prox RCA lesion 50% stenosed  Ost RCA to Prox RCA lesion is 50% stenosed.  Intervention  No interventions have been documented. Right Heart  Right Heart Pressures Hemodynamic findings consistent with aortic valve stenosis.  Left Heart  Left Ventricle LV end diastolic pressure is normal.  Aortic Valve There is severe aortic valve stenosis. The  aortic valve is calcified.  Coronary Diagrams  Diagnostic Dominance: Right  Intervention  Implants   No implant documentation for this case.  Syngo Images  Show images for CARDIAC CATHETERIZATION  Images on Long Term Storage  Show images for Najah, Moala to Procedure Log  Procedure Log    Hemo Data   Most Recent Value  Fick Cardiac Output 2.89 L/min  Fick Cardiac Output Index 2.11 (L/min)/BSA  Aortic Mean Gradient 11.4 mmHg  Aortic Peak Gradient 19 mmHg  Aortic Valve Area 0.84  Aortic Value Area Index 0.61 cm2/BSA  RA A Wave 6 mmHg  RA V Wave 7 mmHg  RA Mean 5 mmHg  RV Systolic Pressure 35 mmHg  RV Diastolic Pressure 2 mmHg  RV EDP 4 mmHg  PA Systolic Pressure 37 mmHg  PA Diastolic Pressure 15 mmHg  PA Mean 24 mmHg  PW A Wave 14 mmHg  PW V Wave 21 mmHg  PW Mean 16 mmHg  AO Systolic Pressure 99991111 mmHg  AO Diastolic Pressure 75 mmHg  AO Mean [id number] mmHg  LV Systolic Pressure 123XX123 mmHg  LV Diastolic Pressure 10 mmHg  LV EDP 14 mmHg  AOp Systolic Pressure Q000111Q mmHg  AOp Diastolic Pressure 67 mmHg  AOp Mean Pressure [id number] mmHg  LVp Systolic Pressure 123XX123 mmHg  LVp Diastolic Pressure 11 mmHg  LVp EDP Pressure 13 mmHg  QP/QS 1  TPVR Index 11.37  HRUI  TSVR Index 50.68 HRUI  PVR SVR Ratio 0.08  TPVR/TSVR Ratio 0.22     Cardiac TAVR CT  TECHNIQUE: The patient was scanned on a Siemens Force [id number] slice scanner. A 120 kV retrospective scan was triggered in the descending thoracic aorta at 111 HU's. Gantry rotation speed was 270 msecs and collimation was .9 mm. No beta blockade or nitro were given. The 3D data set was reconstructed in 5% intervals of the R-R cycle. Systolic and diastolic phases were analyzed on a dedicated work station using MPR, MIP and VRT modes. The patient received 5mL OMNIPAQUE IOHEXOL 350 MG/ML SOLN of contrast.  FINDINGS: Aortic Valve: Trileaflet aortic valve. Severely reduced cusp separation. Severely thickened, severely calcified aortic valve cusps.  AV calcium score: 2626  Virtual Basal Annulus Measurements:  Maximum/Minimum Diameter: 29 x 19.4 mm  Perimeter: 78.4 mm  Area: 448 mm2  LVOT calcifications along the aorto-mitral continuity.  Based on these measurements, the annulus would be suitable for a 26 mm Sapien 3 valve.  Sinus of Valsalva Measurements:  Non-coronary: 34 mm  Right - coronary: 35 mm  Left - coronary: 36 mm  Sinus of Valsalva Height:  Left: 19.1 mm  Right: 20 mm  Aorta: Severe mixed atherosclerotic plaque in the aortic arch, left subclavian artery, brachiocephalic artery ostium, and descending thoracic aorta.  Sinotubular Junction: 31 mm  Ascending Thoracic Aorta: 40 mm  Aortic Arch: 30 mm  Descending Thoracic Aorta: 23 mm  Coronary Artery Height above Annulus:  Left Main: 16.4 mm to the LAD, 16.8 mm to the L circumflex artery  Right Coronary: 17.8 mm  Coronary Arteries: Separate ostia of the LAD and L circumflex artery.  Optimum Fluoroscopic Angle for Delivery: LAO 5, CAU 5  Filling defect in the left atrial appendage, cannot exclude left atrial appendage thrombus. No delay sequence for  comparison.  IMPRESSION: 1. Trileaflet aortic valve. Severely reduced cusp separation. Severely thickened, severely calcified aortic valve cusps.  2.  AV calcium score: 2626  3. Annulus area 448 mm2. The annulus may  be suitable for a 26 mm Sapien 3 valve.  4.  Mild ascending aorta dilation, 40 mm at mid ascending aorta.  5. Severe mixed atherosclerotic plaque in the aortic arch and descending thoracic aorta, as well as the left subclavian artery.  6. Adequate coronary artery heights. Separate ostia of the left circumflex artery and LAD.  7. Optimum Fluoroscopic Angle for Delivery: LAO 5, CAU 5  8. Contrast filling defect in the left atrial appendage, cannot exclude left atrial appendage thrombus.   Electronically Signed   By: Cherlynn Kaiser   On: 10/17/2019 01:35    CT ANGIOGRAPHY CHEST, ABDOMEN AND PELVIS  TECHNIQUE: Multidetector CT imaging through the chest, abdomen and pelvis was performed using the standard protocol during bolus administration of intravenous contrast. Multiplanar reconstructed images and MIPs were obtained and reviewed to evaluate the vascular anatomy.  CONTRAST:  68mL OMNIPAQUE IOHEXOL 350 MG/ML SOLN  COMPARISON:  03/21/2018 CT chest, abdomen and pelvis.  FINDINGS: CTA CHEST FINDINGS  Cardiovascular: Mild cardiomegaly. No significant pericardial effusion/thickening. Diffuse thickening and coarse calcification of the aortic valve. Two lead right subclavian pacemaker with lead tips in the right atrium and right ventricle. Atherosclerotic thoracic aorta with ectatic 4.0 cm ascending thoracic aorta, stable. Normal caliber main pulmonary artery. No central pulmonary emboli.  Mediastinum/Nodes: No discrete thyroid nodules. Unremarkable esophagus. No axillary adenopathy. Mildly enlarged 1.0 cm right paratracheal node (series 5/image 39) and mildly enlarged 1.1 cm subcarinal node (series 5/image 46), not appreciably changed,  most compatible with benign reactive etiology. No new pathologically enlarged mediastinal nodes. No pathologically enlarged hilar nodes.  Lungs/Pleura: No pneumothorax. Small dependent bilateral pleural effusions. Mild passive atelectasis in the dependent lower lobes. No acute consolidative airspace disease, lung masses or significant pulmonary nodules. Stable tiny calcified medial right upper lobe granuloma.  Musculoskeletal: No aggressive appearing focal osseous lesions. Marked thoracic spondylosis.  CTA ABDOMEN AND PELVIS FINDINGS  Hepatobiliary: Normal liver with no liver mass. Cholecystectomy. No significant intrahepatic biliary ductal dilatation. Mildly dilated common bile duct (10 mm diameter), stable, compatible with chronic post cholecystectomy effect.  Pancreas: Normal, with no mass or duct dilation.  Spleen: Normal size. No mass.  Adrenals/Urinary Tract: Normal adrenals. Status post left nephrectomy. No mass or fluid collection in the left nephrectomy bed. No right hydronephrosis. No contour deforming right renal masses. Bladder obscured by streak artifact from right hip hardware, with no gross bladder abnormality.  Stomach/Bowel: Small hiatal hernia involving the remnant stomach. Stable postsurgical changes from partial distal gastrectomy with intact appearing gastrojejunostomy and left abdominal enteroenterostomy. Normal caliber small bowel with no small bowel wall thickening. Appendix not discretely visualized. Normal large bowel with no diverticulosis, large bowel wall thickening or pericolonic fat stranding.  Vascular/Lymphatic: Atherosclerotic nonaneurysmal abdominal aorta. No pathologically enlarged lymph nodes in the abdomen or pelvis.  Reproductive: Status post hysterectomy, with no abnormal findings at the vaginal cuff. No adnexal mass.  Other: No pneumoperitoneum. Trace free fluid in the pelvic cul-de-sac, similar to prior CT. No focal  fluid collections.  Musculoskeletal: No aggressive appearing focal osseous lesions. Right total hip arthroplasty. Mild lumbar spondylosis.  VASCULAR MEASUREMENTS PERTINENT TO TAVR:  AORTA:  Minimal Aortic Diameter-11.7 x 11.0 mm  Severity of Aortic Calcification-severe  RIGHT PELVIS:  Right Common Iliac Artery -  Minimal Diameter-6.4 x 5.6 mm  Tortuosity-mild-to-moderate  Calcification-severe  Right External Iliac Artery -  Minimal Diameter-3.3 x 1.7 mm  Tortuosity-mild-to-moderate  Calcification-severe  Right Common Femoral Artery -  Minimal Diameter-6.1 x 5.7 mm  Tortuosity-mild  Calcification-moderate  LEFT PELVIS:  Left Common Iliac Artery -  Minimal Diameter-7.8 x 6.8 mm  Tortuosity-moderate  Calcification-moderate  Left External Iliac Artery -  Minimal Diameter-4.9 x 4.6 mm  Tortuosity-moderate  Calcification-severe  Left Common Femoral Artery -  Minimal Diameter-7.0 x 6.4 mm  Tortuosity-mild-to-moderate  Calcification-mild  Review of the MIP images confirms the above findings.  IMPRESSION: 1. Vascular findings and measurements pertinent to potential TAVR procedure, as detailed. Note made of severe aortic and iliofemoral atherosclerosis with multifocal high-grade stenoses in the right external iliac artery. 2. Diffuse thickening and coarse calcification of the aortic valve, compatible with the provided history of severe symptomatic aortic stenosis. 3. Small dependent bilateral pleural effusions. 4. Mild cardiomegaly. 5. Stable ectatic 4.0 cm ascending thoracic aorta. Recommend annual imaging followup by CTA or MRA. This recommendation follows 2010 ACCF/AHA/AATS/ACR/ASA/SCA/SCAI/SIR/STS/SVM Guidelines for the Diagnosis and Management of Patients with Thoracic Aortic Disease. Circulation. 2010; [id number]. Aortic aneurysm NOS (ICD10-I71.9). 6. Stable extensive postsurgical changes from prior  cholecystectomy, hysterectomy, distal gastrectomy and left nephrectomy. 7. Aortic Atherosclerosis (ICD10-I70.0).   Electronically Signed   By: Ilona Sorrel M.D.   On: 10/16/2019 14:08     Impression:  Patient has stage D severe symptomatic paradoxical low flow low gradient aortic stenosis.  She describes worsening symptoms of exertional shortness of breath, orthopnea, and occasional PND consistent with chronic diastolic congestive heart failure, New [place] Heart Association functional class III which have steadily progressed over the past 1 to 2 years and been associated with the development of persistent atrial fibrillation.  I have personally reviewed the patient's recent transthoracic echocardiogram, diagnostic cardiac catheterization, and CT angiograms.  Transthoracic echocardiogram reveals features consistent with paradoxical low-flow low gradient severe aortic stenosis.  The aortic valve is trileaflet with severe thickening, calcification, and restricted leaflet mobility involving all 3 leaflets.  The patient has relatively low stroke-volume and stroke-volume index with peak velocity across a aortic valve varying depend on RR interval and ranging as high as 3.3 to 3.4 m/s corresponding to mean transvalvular gradient estimated 25 mmHg and aortic valve area calculated only 0.60 cm.  Diagnostic cardiac catheterization revealed similar findings and was notable for the absence of significant coronary artery disease.    I agree the patient would benefit from aortic valve replacement.  However, I would not consider this elderly patient with numerous comorbid medical problems to be candidate for conventional surgery under any circumstances, particularly given the fact that the patient is a member of the [place] and will not accept blood transfusion.  Cardiac-gated CTA of the heart reveals anatomical characteristics consistent with aortic stenosis suitable for treatment by  transcatheter aortic valve replacement without any significant complicating features.  However, the patient does not have sufficient pelvic vascular access to facilitate a transfemoral approach.  I favor transcatheter aortic valve replacement via either left carotid or left subclavian approach.    Plan:  The patient and her daughter were counseled at length regarding treatment alternatives for management of severe symptomatic aortic stenosis. Alternative approaches such as conventional aortic valve replacement, transcatheter aortic valve replacement, and continued medical therapy without intervention were compared and contrasted at length.  The risks associated with conventional surgical aortic valve replacement were discussed in detail, as were expectations for post-operative convalescence, and why I would not consider this patient a candidate for conventional surgery.  Issues specific to transcatheter aortic valve replacement were discussed including questions about long term valve durability, the potential for paravalvular leak,  possible increased risk of need for permanent pacemaker placement, and other technical complications related to the procedure itself.  Long-term prognosis with medical therapy was discussed. This discussion was placed in the context of the patient's own specific clinical presentation and past medical history.  All of their questions have been addressed.  The patient desires to proceed with transcatheter aortic valve replacement in the near future.  We tentatively plan to proceed on October 30, 2019.  Following the decision to proceed with transcatheter aortic valve replacement, a discussion has been held regarding what types of management strategies would be attempted intraoperatively in the event of life-threatening complications, including whether or not the patient would be considered a candidate for the use of cardiopulmonary bypass and/or conversion to open sternotomy for  attempted surgical intervention.  The patient specifically requests that should a potentially life-threatening complication develop we would not attempt emergency median sternotomy and/or other aggressive surgical procedures.  The patient has been advised of a variety of complications that might develop including but not limited to risks of death, stroke, paravalvular leak, aortic dissection or other major vascular complications, aortic annulus rupture, device embolization, cardiac rupture or perforation, mitral regurgitation, acute myocardial infarction, arrhythmia, heart block or bradycardia requiring permanent pacemaker placement, congestive heart failure, respiratory failure, renal failure, pneumonia, infection, other late complications related to structural valve deterioration or migration, or other complications that might ultimately cause a temporary or permanent loss of functional independence or other long term morbidity.  The patient provides full informed consent for the procedure as described and all questions were answered.    I spent in excess of 90 minutes during the conduct of this office consultation and >50% of this time involved direct face-to-face encounter with the patient for counseling and/or coordination of their care.   Valentina Gu. Roxy Manns, MD 10/22/2019 9:54 AM

## 2019-10-22 NOTE — Therapy (Signed)
[place], [place], [address] Phone: [phone]   Fax:  [phone]  Physical Therapy Evaluation/Pre-TAVR  Patient Details  Name: [name] MRN: [id number] Date of Birth: 1933-11-15 Referring Provider (PT): Burnell Blanks, MD   Encounter Date: 10/22/2019  PT End of Session - 10/22/19 1113    Visit Number  [id number]    PT Start Time  1110    PT Stop Time  1145    PT Time Calculation (min)  35 min    Activity Tolerance  Patient tolerated treatment well    Behavior During Therapy  [facility] for tasks assessed/performed       Past Medical History:  Diagnosis Date  . Adenocarcinoma of stomach ([place]) 11/11/2015   gastric mass on egd  . Anal fissure   . Anemia    hx 3/17 iron infusion, on aranesp and injected B12 since 11/2015.   [name] Anxiety   . Aortic root dilatation ([place])   . Arthritis    oa  . 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 CAD (coronary artery disease)   . Chronic kidney disease    only has one kidney rt lft rem 03 ca     dr. Risa Grill, bladder cancer 05-04-2018  . Elevated LFTs    hepatic steatosis, none recent  . Gastroparesis   . GERD (gastroesophageal reflux disease)   . History of uterine cancer 1980s   treated with hysterectomy, external radiation and radiation seed implants.   [name] HTN (hypertension)   . Hyperlipemia   . Hypothyroidism   . Iron deficiency anemia due to chronic blood loss 11/24/2015  . LVH (left ventricular hypertrophy) 11/14/2015   Mild, noted on ECHO  . Neuropathy    right leg  . Osteomyelitis ([place]) as child  . PAF (paroxysmal atrial fibrillation) ([place])   . Presence of permanent cardiac pacemaker   . Radiation proctitis   . Restless leg syndrome   . Severe aortic stenosis   . Stroke ([place]) 15   partial stroke left legally blind; left eye   . Tachycardia-bradycardia syndrome ([place])    s/p PPM by Dr Doreatha Lew (MDT) 07/03/10    Past Surgical History:  Procedure Laterality Date   . CARDIAC CATHETERIZATION  2008  . CHOLECYSTECTOMY  1970s  . COLONOSCOPY N/A 11/11/2015   Procedure: COLONOSCOPY;  Surgeon: [name], MD;  [facility];  Service: Endoscopy;  Laterality: N/A;  . CYSTOSCOPY W/ RETROGRADES Right 05/01/2018   Procedure: CYSTOSCOPY WITH RIGHT RETROGRADE PYELOGRAM;  Surgeon: Lucas Mallow, MD;  [facility];  Service: Urology;  Laterality: Right;  . CYSTOSCOPY W/ RETROGRADES Right 07/21/2018   Procedure: CYSTOSCOPY WITH RIGHT RETROGRADE PYELOGRAM;  Surgeon: Alexis Frock, MD;  [facility];  Service: Urology;  Laterality: Right;  . ESOPHAGOGASTRODUODENOSCOPY N/A 11/11/2015   Procedure: ESOPHAGOGASTRODUODENOSCOPY (EGD);  Surgeon: [name], MD;  [facility];  Service: Endoscopy;  Laterality: N/A;  . ESOPHAGOGASTRODUODENOSCOPY N/A 02/05/2016   Procedure: ESOPHAGOGASTRODUODENOSCOPY (EGD);  Surgeon: Ladene Artist, MD;  Location: [facility] END[facility]COPY;  Service: Endoscopy;  Laterality: N/A;  . ESOPHAGOGASTRODUODENOSCOPY N/A 04/02/2016   Procedure: ESOPHAGOGASTRODUODENOSCOPY (EGD);  Surgeon: [name], MD;  [facility];  Service: Endoscopy;  Laterality: N/A;  . ESOPHAGOGASTRODUODENOSCOPY (EGD) WITH PROPOFOL N/A 04/06/2017   Procedure: ESOPHAGOGASTRODUODENOSCOPY (EGD) WITH PROPOFOL;  Surgeon: [name], MD;  [facility];  Service: Endoscopy;  Laterality: N/A;  . GASTRECTOMY N/A 01/15/2016   Procedure: OPEN PARTIAL  GASTRECTOMY;  Surgeon: Stark Klein, MD;  Location: [place];  Service: General;  Laterality: N/A;  . GASTRECTOMY Left 02/24/2016   Procedure: OPEN J TUBE;  Surgeon: Stark Klein, MD;  Location: [place];  Service: General;  Laterality: Left;  [name] GASTROJEJUNOSTOMY N/A 05/18/2016   Procedure: ROUX EN [place];  Surgeon: Stark Klein, MD;  Location: [place];  Service: General;  Laterality: N/A;  . I & D KNEE WITH POLY EXCHANGE Right 12/17/2013   Procedure: IRRIGATION AND DEBRIDEMEN RIGHT TOTAL  KNEE  WITH POLY EXCHANGE;  Surgeon: Mauri Pole, MD;  [facility];  Service: Orthopedics;  Laterality: Right;  . IR GENERIC HISTORICAL  06/17/2016   IR RADIOLOGIST EVAL & MGMT 06/17/2016 Ascencion Dike, PA-C [facility] INTERV RAD  . LAPAROSCOPY N/A 01/15/2016   Procedure: LAPAROSCOPY DIAGNOSTIC;  Surgeon: Stark Klein, MD;  Location: [place];  Service: General;  Laterality: N/A;  . LYSIS OF ADHESION  05/18/2016   Procedure: LYSIS OF ADHESION;  Surgeon: Stark Klein, MD;  Location: [place];  Service: General;;  . NEPHRECTOMY Left    renal cell cancer  . PACEMAKER INSERTION  07/04/2011   MDT PPM implanted by Dr Doreatha Lew for tachy/brady; managed by Dr Thompson Grayer   . PATELLAR TENDON REPAIR Right 03/06/2014   Procedure: AVULSION WITH PATELLA TENDON REPAIR;  Surgeon: Mauri Pole, MD;  [facility];  Service: Orthopedics;  Laterality: Right;  . PATENT DUCTUS ARTERIOUS REPAIR  ~ 1949  . RIGHT/LEFT HEART CATH AND CORONARY ANGIOGRAPHY N/A 10/09/2019   Procedure: RIGHT/LEFT HEART CATH AND CORONARY ANGIOGRAPHY;  Surgeon: [name], [name], MD;  Location: [place] [facility];  Service: Cardiovascular;  Laterality: N/A;  . TOTAL ABDOMINAL HYSTERECTOMY  85  . TOTAL HIP REVISION Right 2009   initial replacment surg  . TOTAL KNEE ARTHROPLASTY  02/07/2012   Procedure: TOTAL KNEE ARTHROPLASTY;  Surgeon: Mauri Pole, MD;  [facility];  Service: Orthopedics;  Laterality: Right;  . TOTAL KNEE REVISION Right 07/29/2014   Procedure: RIGHT KNEE REVISION OF PREVIOUS REPAIR EXTENSOR MECHANISM;  Surgeon: Mauri Pole, MD;  [facility];  Service: Orthopedics;  Laterality: Right;  . TRANSURETHRAL RESECTION OF BLADDER TUMOR N/A 07/21/2018   Procedure: TRANSURETHRAL RESECTION OF BLADDER TUMOR (TURBT);  Surgeon: Alexis Frock, MD;  [facility];  Service: Urology;  Laterality: N/A;  . TRANSURETHRAL RESECTION OF BLADDER TUMOR WITH MITOMYCIN-C N/A 05/01/2018   Procedure: TRANSURETHRAL RESECTION OF BLADDER TUMOR  WITH POST OP INSTILLATION OF GEMCITABINE;  Surgeon: Lucas Mallow, MD;  [facility];  Service: Urology;  Laterality: N/A;  . WHIPPLE PROCEDURE N/A 05/18/2016   Procedure: EXPLORATORY LAPAROTOMY;  Surgeon: Stark Klein, MD;  Location: [place];  Service: General;  Laterality: N/A;    There were no vitals filed for this visit.   Subjective Assessment - 10/22/19 1114    Subjective  In and out of Afib and CHF. Murmur became more obvious and found. I was walking with a cane but now I have to use a walker. Has neuropathy and Rt leg stayed asleep and heavy since cath. Does steps well at home but is very fatigued by the time she reaches the top.    Currently in Pain?  No/denies         [facility] PT Assessment - 10/22/19 0001      Assessment   Medical Diagnosis  severe aortic stenosis    Referring Provider (PT)  Burnell Blanks, MD  Onset Date/Surgical Date  --   chronic   Hand Dominance  Right      Precautions   Precautions  None      Restrictions   Weight Bearing Restrictions  No      Balance Screen   Has the patient fallen in the past 6 months  Yes    How many times?  1    Has the patient had a decrease in activity level because of a fear of falling?   Yes    Is the patient reluctant to leave their home because of a fear of falling?   No      Home Environment   Living Environment  Private residence    Living Arrangements  Children    Additional Comments  tri-level home      Prior Function   Level of Independence  Independent with basic ADLs;Independent with household mobility with device      Cognition   Overall Cognitive Status  Within Functional Limits for tasks assessed      Sensation   Additional Comments  Rt leg feels asleep, cannot feel feet      Posture/Postural Control   Posture Comments  rounded shoulders, forward head      ROM / Strength   AROM / PROM / Strength  AROM;Strength      AROM   Overall AROM Comments  AROM except Rt knee limited to  approx 60 deg      Strength   Overall Strength Comments  gross 5/5 except Rt knee 3/5    Strength Assessment Site  Hand    Right/Left hand  Right;Left    Right Hand Grip (lbs)  55    Left Hand Grip (lbs)  40       [facility] Pre-Surgical Assessment - 10/22/19 0001    5 Meter Walk Test- trial 1  6 sec    5 Meter Walk Test- trial 2  6 sec.     5 Meter Walk Test- trial 3  5 sec.    5 meter walk test average  5.67 sec    comment  0    Sit To Stand Test- trial 1  0 sec.    Comment  UA    6 Minute Walk- Baseline  yes    BP (mmHg)  160/90    HR (bpm)  52    02 Sat (%RA)  100 %    Modified Borg Scale for Dyspnea  3- Moderate shortness of breath or breathing difficulty    Perceived Rate of Exertion (Borg)  6-    6 Minute Walk Post Test  yes    BP (mmHg)  (!) 164/100    HR (bpm)  89    02 Sat (%RA)  100 %    Modified Borg Scale for Dyspnea  5- Strong or hard breathing    Perceived Rate of Exertion (Borg)  15- Hard    Aerobic Endurance Distance Walked  375    Endurance additional comments  70% disability compared to age-related norm              Objective measurements completed on examination: See above findings.                           Plan - 10/22/19 1155    PT Frequency  One time visit    Consulted and Agree with Plan of Care  Patient;Family member/caregiver  Family Member Consulted  daughter       Clinical Impression Statement: Pt is a 86 yo F presenting to OP PT for evaluation prior to possible TAVR surgery due to severe aortic stenosis. Pt reports onset of SOB many years ago. Symptoms are  limiting functional endurance and stair climbing at home. Pt presents with mostly WFL ROM and strength, except for noted Rt knee and denies musculoskeletal pain.   Pt ambulated 125 feet in 2.5 min before requesting a seated rest beak lasting 30s. At time of rest, patient's HR was 93 bpm and O2 was 96 on room air. Pt reported 5/10 shortness of breath on modified  scale for dyspnea. Pt able to resume after rest. Pt ambulated a total of 375 feet in 6 minute walk and reported 5/10 SOB on modified scale for dyspena and 15/20 RPE on Borg's perceived exertion and pain scale at the end of the walk. During the 6 minute walk test, patient's HR increased to 93 BPM and O2 saturation decreased to 93%. Based on the Short Physical Performance Battery, patient has a frailty rating of 4/12 with </= 5/12 considered frail.  Visit Diagnosis: Difficulty in walking, not elsewhere classified     Problem List Patient Active Problem List   Diagnosis Date Noted  . Abnormal glucose 01/29/2019  . Acute on chronic diastolic heart failure ([place]) 01/05/2019  . Malignant tumor of urinary bladder ([place]) 05/11/2018  . History of stroke 04/11/2018  . Long term current use of anticoagulant therapy 03/19/2016  . No blood products 02/27/2016  . S/P partial gastrectomy 02/17/2016  . Post gastrectomy syndrome   . Gastroesophageal reflux disease with esophagitis   . Iron deficiency anemia due to chronic blood loss 11/24/2015  . Refusal of blood transfusions as patient is Jehovah's Witness   . Adenocarcinoma of stomach ([place]) 11/11/2015  . CKD (chronic kidney disease), stage III 11/10/2015  . Vitamin B12 deficiency 11/09/2015  . Essential hypertension 06/04/2014  . Vitamin D deficiency 09/24/2013  . Hyperlipidemia, mixed   . Cardiac pacemaker  08/26/2012  . FHx/o Colon Cancer 07/27/2011  . Severe aortic stenosis 07/09/2011  . S/P repair of patent ductus arteriosus 04/22/2011  . Tachycardia-bradycardia syndrome ([place]) 12/24/2010  . Atrial fibrillation ([place]) Chadsvasc 5 12/02/2010  . Malignant neoplasm of kidney excluding renal pelvis ([place]) 11/17/2007  . Hypothyroidism 11/17/2007   [name] PT, DPT 10/22/19 11:59 AM   [facility] [address] [place], [place], [address] Phone: [phone]   Fax:   [phone]  Name: [name] MRN: [id number] Date of Birth: 12-03-1933

## 2019-10-22 NOTE — Patient Instructions (Addendum)
Stop taking warfarin (Coumadin) after you take your dose on Wednesday 10/24/2019 Start taking Lovenox injections on Friday 10/26/2019  Continue taking all other medications without change through the day before surgery, Monday 10/29/2019  Make sure to bring all of your medications with you when you come for your Pre-Admission Testing appointment at [facility] Short-Stay Department.  Have nothing to eat or drink after midnight the night before surgery.  On the morning of surgery take only Synthroid, Metoprolol, Calan-SR, and Pepcid with a sip of water.  Do NOT take a Lovenox injection on the morning of surgery.  At your appointment for Pre-Admission Testing at the [facility] Short-Stay Department you will be asked to sign permission forms for your upcoming surgery.  By definition your signature on these forms implies that you and/or your designee provide full informed consent for your planned surgical procedure(s), that alternative treatment options have been discussed, that you understand and accept any and all potential risks, and that you have some understanding of what to expect for your post-operative convalescence.  For any major cardiac surgical procedure potential operative risks include but are not limited to at least some risk of death, stroke or other neurologic complication, myocardial infarction, congestive heart failure, respiratory failure, renal failure, bleeding requiring blood transfusion and/or reexploration, irregular heart rhythm, heart block or bradycardia requiring permanent pacemaker, pneumonia, pericardial effusion, pleural effusion, wound infection, pulmonary embolus or other thromboembolic complication, chronic pain, or other complications related to the specific procedure(s) performed.  For transcatheter aortic valve replacement additional risks include but are not limited to risk of paravalvular leak, valve embolization, valve thrombosis,  aortic dissection, aortic rupture, ventricular septal defect or perforation, pericardial tamponade, injury of the abdominal aorta or its branches, and/or injury or occlusion of the arteries going to your arms or legs.  Please call to schedule a follow-up appointment in our office prior to surgery if you have any unresolved questions about your planned surgical procedure, the associated risks, alternative treatment options, and/or expectations for your post-operative recovery.

## 2019-10-23 ENCOUNTER — Telehealth: Payer: Self-pay | Admitting: Pharmacist

## 2019-10-23 ENCOUNTER — Other Ambulatory Visit: Payer: Self-pay

## 2019-10-23 DIAGNOSIS — I350 Nonrheumatic aortic (valve) stenosis: Secondary | ICD-10-CM

## 2019-10-23 NOTE — Telephone Encounter (Signed)
Spoke with daughter, [name], to confirm she is aware patient last done of warfarin should be tomorrow 2/17.  We will still have home health check her INR on Thursday 2/18 to make sure Lovenox is started on the correct day. More detailed lovenox instructions will be given on 2/18. Patient has 8 syringes left from previous hold for cath.

## 2019-10-23 NOTE — Telephone Encounter (Signed)
-----   Message from Barkley Boards, RN sent at 10/23/2019  9:54 AM EST ----- Regarding: FW: Lovenox bridge for 2/23 TAVR Hey,  Sorry I sent this initially to NL.  Appears pt is followed at [facility].  Thanks, Lauren  ----- Message ----- From: Barkley Boards, RN Sent: 10/23/2019   9:49 AM EST To: Cv Div Nl Anticoag Subject: Lovenox bridge for 2/23 TAVR                   Hey,  This pt has been scheduled for TAVR on 2/23 and Dr Roxy Manns has instructed the pt to have lovenox bridge.   Per 2/15 visit with Dr Roxy Manns: Stop taking warfarin (Coumadin) after you take your dose on Wednesday 10/24/2019 Start taking Lovenox injections on Friday 10/26/2019  Continue taking all other medications without change through the day before surgery, Monday 10/29/2019  Make sure to bring all of your medications with you when you come for your Pre-Admission Testing appointment at [facility] Short-Stay Department.  Have nothing to eat or drink after midnight the night before surgery.  On the morning of surgery take only Synthroid, Metoprolol, Calan-SR, and Pepcid with a sip of water.  Do NOT take a Lovenox injection on the morning of surgery.     The pt does have Lovenox at home left from her bridge for cardiac cath.  Please contact the pt's daughter [name] at [phone] with instructions.  Thank you, Ander Purpura

## 2019-10-25 ENCOUNTER — Telehealth: Payer: Self-pay | Admitting: Pharmacist

## 2019-10-25 ENCOUNTER — Encounter: Payer: Self-pay | Admitting: Internal Medicine

## 2019-10-25 NOTE — Telephone Encounter (Signed)
Home health RN unable to go out to house today for INR check. Patient does not have to start lovenox until tomorrow night. She has to come into town tomorrow for pre-procedural testing and COVID testing.  I have confirmed with Theodosia Quay that an INR will be drawn at pre-procedural testing and it will result in just a few hours.  Will call daughter tomorrow with final lovenox instructions

## 2019-10-25 NOTE — Progress Notes (Addendum)
Patient denies shortness of breath, fever, cough and chest pain.  PCP - Dr [name] Cardiologist - Dr [name]  Chest x-ray - 10/26/19 EKG - 10/26/19 Stress Test - denies ECHO - 09/17/19 Cardiac Cath - 10/09/19  ICD Pacemaker-Device orders sent to device clinic via IB message by Jasmine Pang, RN on 10/25/19.  Last remote check 08/30/19.  Medtronic ADDR01 Adapta Pacemaker.  Pacemaker Order recvd back - in [facility].    Blood Thinner Instructions:  Coumadin last dose on 10/24/19.  Start Lovenox on 10/26/19 but do not take it on DOS per MD's instructions.  Patient is a Jehovah Witness, Blood Refusal Consent placed on chart.  Called Md's office [phone], spoke with Altha Harm who will relay message to Dr [name] and Dr Angelena Form.    Anesthesia review: Yes  Coronavirus Screening Have you experienced the following symptoms:  Cough yes/no: No Fever (>100.46F)  yes/no: No Runny nose yes/no: No Sore throat yes/no: No Difficulty breathing/shortness of breath  yes/no: Yes but normal for patient.  Have you traveled in the last 14 days and where? yes/no: No  Patient verbalized understanding of instructions that were given to them at the PAT appointment.

## 2019-10-25 NOTE — Progress Notes (Addendum)
Device orders send to device clinic via IB message  Spoke with Theodosia Quay RN - TAVR coordinator patient will need to have PT/INR drawn at PAT visit and then will need to be repeated on the day of the procedure

## 2019-10-25 NOTE — Progress Notes (Signed)
[facility] DEVICE PROGRAMMING  Patient Information: Name:  [name]  DOB:  [birth year]  MRN:  [id number]    Planned Procedure: TAVR  Surgeon: Angelena Form  Date of Procedure: 10/30/19  Cautery will be used.  Position during surgery: supine   Please send documentation back to:  [facility] (Fax # [phone])   Device Information:  Clinic EP Physician:  Thompson Grayer, MD  Device Type:  Pacemaker Manufacturer and Phone #:  Medtronic: [id number] Pacemaker Dependent?:  No. Date of Last Device Check:  08/30/2019  Normal Device Function?:  Yes.    Electrophysiologist's Recommendations:   Have magnet available.  Provide continuous ECG monitoring when magnet is used or reprogramming is to be performed.   Procedure should not interfere with device function.  No device programming or magnet placement needed.  Per Device Clinic [address], [place], [place]  3:54 PM 10/25/2019

## 2019-10-25 NOTE — Progress Notes (Addendum)
[id number] - [place], [place] [address] Phone: [phone] Fax: [phone]      Your procedure is scheduled on February 23  Report to [facility] Main Entrance "A" at 0900 A.M., and check in at the Admitting office.  Call this number if you have problems the morning of surgery:  [phone]  Call [phone] if you have any questions prior to your surgery date Monday-Friday 8am-4pm    Remember:  Do not eat or drink after midnight the night before your surgery     Stop taking warfarin (Coumadin) after you take your dose on Wednesday 10/24/2019  Start taking Lovenox injections on Friday 10/26/2019 (follow instructions per Anticoagulation clinic)  Continue taking all other medications without change through the day before surgery, Monday 10/29/2019  On the morning of surgery take only Synthroid, Metoprolol, Calan-SR, and Pepcid with a sip of water.  Do NOT take a Lovenox injection  on the morning of surgery.   7 days prior to surgery STOP taking any Aleve, Naproxen, Ibuprofen, Motrin, Advil, Goody's, BC's, all herbal medications, fish oil, and all vitamins.    The Morning of Surgery  Do not wear jewelry, make-up or nail polish.  Do not wear lotions, powders, or perfumes/colognes, or deodorant  Do not shave 48 hours prior to surgery.   Do not bring valuables to the hospital.  [facility] is not responsible for any belongings or valuables.  If you are a smoker, DO NOT Smoke 24 hours prior to surgery  If you wear a CPAP at night please bring your mask the morning of surgery   Remember that you must have someone to transport you home after your surgery, and remain with you for 24 hours if you are discharged the same day.   Please bring cases for contacts, glasses, hearing aids, dentures or bridgework because it cannot be worn into surgery.    Leave your suitcase in the car.  After surgery it may be brought  to your room.  For patients admitted to the hospital, discharge time will be determined by your treatment team.  Patients discharged the day of surgery will not be allowed to drive home.    Special instructions:   [facility]- Preparing For Surgery  Before surgery, you can play an important role. Because skin is not sterile, your skin needs to be as free of germs as possible. You can reduce the number of germs on your skin by washing with CHG (chlorahexidine gluconate) Soap before surgery.  CHG is an antiseptic cleaner which kills germs and bonds with the skin to continue killing germs even after washing.    Oral Hygiene is also important to reduce your risk of infection.  Remember - BRUSH YOUR TEETH THE MORNING OF SURGERY WITH YOUR REGULAR TOOTHPASTE  Please do not use if you have an allergy to CHG or antibacterial soaps. If your skin becomes reddened/irritated stop using the CHG.  Do not shave (including legs and underarms) for at least 48 hours prior to first CHG shower. It is OK to shave your face.  Please follow these instructions carefully.   1. Shower the NIGHT BEFORE SURGERY and the MORNING OF SURGERY with CHG Soap.   2. If you chose to wash your hair, wash your hair first as usual with your normal shampoo.  3. After you shampoo, rinse your hair and body thoroughly to remove the shampoo.  4. Use CHG as you would  any other liquid soap. You can apply CHG directly to the skin and wash gently with a scrungie or a clean washcloth.   5. Apply the CHG Soap to your body ONLY FROM THE NECK DOWN.  Do not use on open wounds or open sores. Avoid contact with your eyes, ears, mouth and genitals (private parts). Wash Face and genitals (private parts)  with your normal soap.   6. Wash thoroughly, paying special attention to the area where your surgery will be performed.  7. Thoroughly rinse your body with warm water from the neck down.  8. DO NOT shower/wash with your normal soap after  using and rinsing off the CHG Soap.  9. Pat yourself dry with a CLEAN TOWEL.  10. Wear CLEAN PAJAMAS to bed the night before surgery, wear comfortable clothes the morning of surgery  11. Place CLEAN SHEETS on your bed the night of your first shower and DO NOT SLEEP WITH PETS.    Day of Surgery:  Please shower the morning of surgery with the CHG soap Do not apply any deodorants/lotions. Please wear clean clothes to the hospital/surgery center.   Remember to brush your teeth WITH YOUR REGULAR TOOTHPASTE.   Please read over the following fact sheets that you were given.

## 2019-10-26 ENCOUNTER — Encounter (HOSPITAL_COMMUNITY)
Admission: RE | Admit: 2019-10-26 | Discharge: 2019-10-26 | Disposition: A | Discharge status: Home / Self Care | Payer: Medicare Other | Source: Ambulatory Visit | Attending: Cardiovascular Disease | Admitting: Cardiovascular Disease

## 2019-10-26 ENCOUNTER — Other Ambulatory Visit: Payer: Self-pay

## 2019-10-26 ENCOUNTER — Telehealth: Payer: Self-pay | Admitting: Cardiology

## 2019-10-26 ENCOUNTER — Encounter (HOSPITAL_COMMUNITY): Payer: Self-pay

## 2019-10-26 ENCOUNTER — Ambulatory Visit (HOSPITAL_COMMUNITY)
Admission: RE | Admit: 2019-10-26 | Discharge: 2019-10-26 | Disposition: A | Discharge status: Home / Self Care | Payer: Medicare Other | Source: Ambulatory Visit | Attending: Cardiovascular Disease | Admitting: Cardiovascular Disease

## 2019-10-26 ENCOUNTER — Other Ambulatory Visit (HOSPITAL_COMMUNITY)
Admission: RE | Admit: 2019-10-26 | Discharge: 2019-10-26 | Disposition: A | Discharge status: Home / Self Care | Payer: Medicare Other | Source: Ambulatory Visit | Attending: Cardiovascular Disease | Admitting: Cardiovascular Disease

## 2019-10-26 DIAGNOSIS — Z20822 Contact with and (suspected) exposure to covid-19: Secondary | ICD-10-CM | POA: Diagnosis not present

## 2019-10-26 DIAGNOSIS — J439 Emphysema, unspecified: Secondary | ICD-10-CM | POA: Insufficient documentation

## 2019-10-26 DIAGNOSIS — I350 Nonrheumatic aortic (valve) stenosis: Secondary | ICD-10-CM | POA: Insufficient documentation

## 2019-10-26 DIAGNOSIS — Z950 Presence of cardiac pacemaker: Secondary | ICD-10-CM | POA: Diagnosis not present

## 2019-10-26 DIAGNOSIS — Z01818 Encounter for other preprocedural examination: Secondary | ICD-10-CM | POA: Insufficient documentation

## 2019-10-26 DIAGNOSIS — R0602 Shortness of breath: Secondary | ICD-10-CM | POA: Diagnosis not present

## 2019-10-26 HISTORY — DX: Unspecified hearing loss, unspecified ear: H91.90

## 2019-10-26 HISTORY — DX: Unspecified cataract: H26.9

## 2019-10-26 HISTORY — DX: Dependence on other enabling machines and devices: Z99.89

## 2019-10-26 HISTORY — DX: Presence of dental prosthetic device (complete) (partial): Z97.2

## 2019-10-26 HISTORY — DX: Presence of spectacles and contact lenses: Z97.3

## 2019-10-26 LAB — CBC
HCT: 44.2 % (ref 36.0–46.0)
Hemoglobin: 15 g/dL (ref 12.0–15.0)
MCH: 33.3 pg (ref 26.0–34.0)
MCHC: 33.9 g/dL (ref 30.0–36.0)
MCV: 98 fL (ref 80.0–100.0)
Platelets: 198 10*3/uL (ref 150–400)
RBC: 4.51 MIL/uL (ref 3.87–5.11)
RDW: 13.7 % (ref 11.5–15.5)
WBC: 7.9 10*3/uL (ref 4.0–10.5)
nRBC: 0 % (ref 0.0–0.2)

## 2019-10-26 LAB — URINALYSIS, ROUTINE W REFLEX MICROSCOPIC
Bilirubin Urine: NEGATIVE
Glucose, UA: NEGATIVE mg/dL
Hgb urine dipstick: NEGATIVE
Ketones, ur: NEGATIVE mg/dL
Leukocytes,Ua: NEGATIVE
Nitrite: NEGATIVE
Protein, ur: NEGATIVE mg/dL
Specific Gravity, Urine: 1.006 (ref 1.005–1.030)
pH: 5 (ref 5.0–8.0)

## 2019-10-26 LAB — BLOOD GAS, ARTERIAL
Acid-base deficit: 3.2 mmol/L — ABNORMAL HIGH (ref 0.0–2.0)
Bicarbonate: 19.9 mmol/L — ABNORMAL LOW (ref 20.0–28.0)
Drawn by: 421801
FIO2: 21
O2 Saturation: 97.3 %
Patient temperature: 37
pCO2 arterial: 27.4 mmHg — ABNORMAL LOW (ref 32.0–48.0)
pH, Arterial: 7.474 — ABNORMAL HIGH (ref 7.350–7.450)
pO2, Arterial: 113 mmHg — ABNORMAL HIGH (ref 83.0–108.0)

## 2019-10-26 LAB — PROTIME-INR
INR: 1.8 — ABNORMAL HIGH (ref 0.8–1.2)
Prothrombin Time: 20.3 seconds — ABNORMAL HIGH (ref 11.4–15.2)

## 2019-10-26 LAB — COMPREHENSIVE METABOLIC PANEL
ALT: 26 U/L (ref 0–44)
AST: 29 U/L (ref 15–41)
Albumin: 3.5 g/dL (ref 3.5–5.0)
Alkaline Phosphatase: 59 U/L (ref 38–126)
Anion gap: 10 (ref 5–15)
BUN: 18 mg/dL (ref 8–23)
CO2: 18 mmol/L — ABNORMAL LOW (ref 22–32)
Calcium: 9 mg/dL (ref 8.9–10.3)
Chloride: 115 mmol/L — ABNORMAL HIGH (ref 98–111)
Creatinine, Ser: 0.95 mg/dL (ref 0.44–1.00)
GFR calc Af Amer: >60 mL/min (ref >60)
GFR calc non Af Amer: 55 mL/min — ABNORMAL LOW (ref >60)
Glucose, Bld: 98 mg/dL (ref 70–99)
Potassium: 3.7 mmol/L (ref 3.5–5.1)
Sodium: 143 mmol/L (ref 135–145)
Total Bilirubin: 0.7 mg/dL (ref 0.3–1.2)
Total Protein: 6.1 g/dL — ABNORMAL LOW (ref 6.5–8.1)

## 2019-10-26 LAB — SURGICAL PCR SCREEN
MRSA, PCR: NEGATIVE
Staphylococcus aureus: NEGATIVE

## 2019-10-26 LAB — BRAIN NATRIURETIC PEPTIDE: B Natriuretic Peptide: 2138.2 pg/mL — ABNORMAL HIGH (ref 0.0–100.0)

## 2019-10-26 LAB — HEMOGLOBIN A1C
Hgb A1c MFr Bld: 5.4 % (ref 4.8–5.6)
Mean Plasma Glucose: 108.28 mg/dL

## 2019-10-26 LAB — APTT: aPTT: 30 seconds (ref 24–36)

## 2019-10-26 LAB — NO BLOOD PRODUCTS

## 2019-10-26 LAB — SARS CORONAVIRUS 2 (TAT 6-24 HRS): SARS Coronavirus 2: NEGATIVE

## 2019-10-26 NOTE — Telephone Encounter (Addendum)
Unable to place home health order at this time since the current cert ends on [id number]. Will call after patient is discharged from hospital to place order

## 2019-10-26 NOTE — Telephone Encounter (Addendum)
[name], daughter, and reviewed the instructions with her. I have called and left message with [facility] to check INR on 11/05/19. I requested a call back. Instructions for bridge sent via mychart as well.  2/17: Last dose of Coumadin.  2/18: No Coumadin or Lovenox.  2/19: Inject Lovenox 60mg  in the fatty abdominal tissue at least 2 inches from the belly button once a day at 9 PM. rotate sites. No Coumadin.  2/20: Inject Lovenox in the fatty tissue at 9 PM. No Coumadin.  2/21: Inject Lovenox in the fatty tissue at 9PM. No Coumadin.  2/22: No Lovenox. No Coumadin.  2/23: Procedure Day - No Lovenox - Resume Coumadin in the evening or as directed by doctor (take an extra half tablet with usual dose for 2 days then resume normal dose).  2/24: Resume Lovenox inject in the fatty tissue daily at 8AM and take Coumadin.  2/25: Inject Lovenox in the fatty tissue at 8 AM and take Coumadin.  2/26: Inject Lovenox in the fatty tissue at 8 AM and take Coumadin.  2/27: Inject Lovenox in the fatty tissue at 8AM and take Coumadin.  2/28: Inject Lovenox in the fatty tissue at 8 AM and take Coumadin.  3/1: Coumadin appt to check INR.

## 2019-10-26 NOTE — Telephone Encounter (Signed)
Will route to MD as an FYI.

## 2019-10-26 NOTE — Telephone Encounter (Signed)
New message    Per Arbie Cookey at [place] patient has signed a waiver to have no blood or blood products because she is a Jehovah's witness for surgery.

## 2019-10-26 NOTE — Telephone Encounter (Signed)
Noted  

## 2019-10-29 MED ORDER — DEXMEDETOMIDINE HCL IN NACL 400 MCG/100ML IV SOLN
0.1000 ug/kg/h | INTRAVENOUS | Status: DC
  Filled 2019-10-29: qty 100

## 2019-10-29 MED ORDER — SODIUM CHLORIDE 0.9 % IV SOLN
1.5000 g | INTRAVENOUS | Status: AC
  Administered 2019-10-30: 12:00:00 1.5 g via INTRAVENOUS
  Filled 2019-10-29: qty 1.5

## 2019-10-29 MED ORDER — POTASSIUM CHLORIDE 2 MEQ/ML IV SOLN
80.0000 meq | INTRAVENOUS | Status: DC
  Filled 2019-10-29: qty 40

## 2019-10-29 MED ORDER — NOREPINEPHRINE 4 MG/250ML-% IV SOLN
0.0000 ug/min | INTRAVENOUS | Status: AC
  Administered 2019-10-30: 12:00:00 2 ug/min via INTRAVENOUS
  Filled 2019-10-29: qty 250

## 2019-10-29 MED ORDER — MAGNESIUM SULFATE 50 % IJ SOLN
40.0000 meq | INTRAMUSCULAR | Status: DC
  Filled 2019-10-29: qty 9.85

## 2019-10-29 MED ORDER — VANCOMYCIN HCL 1250 MG/250ML IV SOLN
1250.0000 mg | INTRAVENOUS | Status: AC
  Administered 2019-10-30: 12:00:00 1250 mg via INTRAVENOUS
  Filled 2019-10-29: qty 250

## 2019-10-29 MED ORDER — SODIUM CHLORIDE 0.9 % IV SOLN
INTRAVENOUS | Status: DC
  Filled 2019-10-29: qty 30

## 2019-10-30 ENCOUNTER — Encounter (HOSPITAL_COMMUNITY): Payer: Self-pay | Admitting: Cardiovascular Disease

## 2019-10-30 ENCOUNTER — Inpatient Hospital Stay (HOSPITAL_COMMUNITY)
Admission: RE | Admit: 2019-10-30 | Discharge: 2019-10-31 | DRG: 266 | Disposition: A | Discharge status: Home Health | Payer: Medicare Other | Attending: Thoracic Surgery (Cardiothoracic Vascular Surgery) | Admitting: Thoracic Surgery (Cardiothoracic Vascular Surgery)

## 2019-10-30 ENCOUNTER — Other Ambulatory Visit: Payer: Self-pay | Admitting: Physician Assistant

## 2019-10-30 ENCOUNTER — Inpatient Hospital Stay (HOSPITAL_COMMUNITY): Payer: Medicare Other

## 2019-10-30 ENCOUNTER — Inpatient Hospital Stay (HOSPITAL_COMMUNITY): Payer: Medicare Other | Admitting: Vascular Surgery

## 2019-10-30 ENCOUNTER — Encounter (HOSPITAL_COMMUNITY)
Admission: RE | Disposition: A | Discharge status: Home / Self Care | Payer: Medicare Other | Source: Home / Self Care | Attending: Thoracic Surgery (Cardiothoracic Vascular Surgery)

## 2019-10-30 ENCOUNTER — Inpatient Hospital Stay (HOSPITAL_COMMUNITY): Payer: Medicare Other | Admitting: Critical Care Medicine

## 2019-10-30 ENCOUNTER — Other Ambulatory Visit: Payer: Self-pay

## 2019-10-30 DIAGNOSIS — E782 Mixed hyperlipidemia: Secondary | ICD-10-CM | POA: Diagnosis present

## 2019-10-30 DIAGNOSIS — Z8551 Personal history of malignant neoplasm of bladder: Secondary | ICD-10-CM | POA: Diagnosis not present

## 2019-10-30 DIAGNOSIS — Z006 Encounter for examination for normal comparison and control in clinical research program: Secondary | ICD-10-CM

## 2019-10-30 DIAGNOSIS — I77810 Thoracic aortic ectasia: Secondary | ICD-10-CM | POA: Diagnosis not present

## 2019-10-30 DIAGNOSIS — I4819 Other persistent atrial fibrillation: Secondary | ICD-10-CM | POA: Diagnosis present

## 2019-10-30 DIAGNOSIS — I69312 Visuospatial deficit and spatial neglect following cerebral infarction: Secondary | ICD-10-CM

## 2019-10-30 DIAGNOSIS — I4891 Unspecified atrial fibrillation: Secondary | ICD-10-CM | POA: Diagnosis present

## 2019-10-30 DIAGNOSIS — I495 Sick sinus syndrome: Secondary | ICD-10-CM | POA: Diagnosis not present

## 2019-10-30 DIAGNOSIS — K911 Postgastric surgery syndromes: Secondary | ICD-10-CM | POA: Diagnosis present

## 2019-10-30 DIAGNOSIS — Z888 Allergy status to other drugs, medicaments and biological substances status: Secondary | ICD-10-CM

## 2019-10-30 DIAGNOSIS — Z85028 Personal history of other malignant neoplasm of stomach: Secondary | ICD-10-CM

## 2019-10-30 DIAGNOSIS — F419 Anxiety disorder, unspecified: Secondary | ICD-10-CM | POA: Diagnosis present

## 2019-10-30 DIAGNOSIS — Z905 Acquired absence of kidney: Secondary | ICD-10-CM

## 2019-10-30 DIAGNOSIS — Z8249 Family history of ischemic heart disease and other diseases of the circulatory system: Secondary | ICD-10-CM

## 2019-10-30 DIAGNOSIS — N289 Disorder of kidney and ureter, unspecified: Secondary | ICD-10-CM | POA: Diagnosis not present

## 2019-10-30 DIAGNOSIS — I350 Nonrheumatic aortic (valve) stenosis: Secondary | ICD-10-CM

## 2019-10-30 DIAGNOSIS — Z952 Presence of prosthetic heart valve: Secondary | ICD-10-CM

## 2019-10-30 DIAGNOSIS — G629 Polyneuropathy, unspecified: Secondary | ICD-10-CM | POA: Diagnosis present

## 2019-10-30 DIAGNOSIS — Z531 Procedure and treatment not carried out because of patient's decision for reasons of belief and group pressure: Secondary | ICD-10-CM | POA: Diagnosis present

## 2019-10-30 DIAGNOSIS — Z9049 Acquired absence of other specified parts of digestive tract: Secondary | ICD-10-CM

## 2019-10-30 DIAGNOSIS — I5033 Acute on chronic diastolic (congestive) heart failure: Secondary | ICD-10-CM | POA: Diagnosis present

## 2019-10-30 DIAGNOSIS — Z20822 Contact with and (suspected) exposure to covid-19: Secondary | ICD-10-CM | POA: Diagnosis present

## 2019-10-30 DIAGNOSIS — N1831 Chronic kidney disease, stage 3a: Secondary | ICD-10-CM | POA: Diagnosis present

## 2019-10-30 DIAGNOSIS — Z923 Personal history of irradiation: Secondary | ICD-10-CM

## 2019-10-30 DIAGNOSIS — I700 Atherosclerosis of aorta: Secondary | ICD-10-CM | POA: Diagnosis not present

## 2019-10-30 DIAGNOSIS — I2510 Atherosclerotic heart disease of native coronary artery without angina pectoris: Secondary | ICD-10-CM | POA: Diagnosis present

## 2019-10-30 DIAGNOSIS — Z800 Family history of malignant neoplasm of digestive organs: Secondary | ICD-10-CM

## 2019-10-30 DIAGNOSIS — I5032 Chronic diastolic (congestive) heart failure: Secondary | ICD-10-CM | POA: Diagnosis present

## 2019-10-30 DIAGNOSIS — D500 Iron deficiency anemia secondary to blood loss (chronic): Secondary | ICD-10-CM | POA: Diagnosis present

## 2019-10-30 DIAGNOSIS — C649 Malignant neoplasm of unspecified kidney, except renal pelvis: Secondary | ICD-10-CM | POA: Diagnosis present

## 2019-10-30 DIAGNOSIS — Z96651 Presence of right artificial knee joint: Secondary | ICD-10-CM | POA: Diagnosis present

## 2019-10-30 DIAGNOSIS — N183 Chronic kidney disease, stage 3 unspecified: Secondary | ICD-10-CM | POA: Diagnosis not present

## 2019-10-30 DIAGNOSIS — Z903 Acquired absence of stomach [part of]: Secondary | ICD-10-CM

## 2019-10-30 DIAGNOSIS — E039 Hypothyroidism, unspecified: Secondary | ICD-10-CM | POA: Diagnosis present

## 2019-10-30 DIAGNOSIS — Z789 Other specified health status: Secondary | ICD-10-CM | POA: Diagnosis present

## 2019-10-30 DIAGNOSIS — Z7901 Long term (current) use of anticoagulants: Secondary | ICD-10-CM

## 2019-10-30 DIAGNOSIS — Z8774 Personal history of (corrected) congenital malformations of heart and circulatory system: Secondary | ICD-10-CM

## 2019-10-30 DIAGNOSIS — G2581 Restless legs syndrome: Secondary | ICD-10-CM | POA: Diagnosis present

## 2019-10-30 DIAGNOSIS — Z91048 Other nonmedicinal substance allergy status: Secondary | ICD-10-CM

## 2019-10-30 DIAGNOSIS — I130 Hypertensive heart and chronic kidney disease with heart failure and stage 1 through stage 4 chronic kidney disease, or unspecified chronic kidney disease: Secondary | ICD-10-CM | POA: Diagnosis not present

## 2019-10-30 DIAGNOSIS — K2100 Gastro-esophageal reflux disease with esophagitis, without bleeding: Secondary | ICD-10-CM | POA: Diagnosis present

## 2019-10-30 DIAGNOSIS — Z85528 Personal history of other malignant neoplasm of kidney: Secondary | ICD-10-CM

## 2019-10-30 DIAGNOSIS — Z8542 Personal history of malignant neoplasm of other parts of uterus: Secondary | ICD-10-CM

## 2019-10-30 DIAGNOSIS — Z79899 Other long term (current) drug therapy: Secondary | ICD-10-CM

## 2019-10-30 DIAGNOSIS — Z885 Allergy status to narcotic agent status: Secondary | ICD-10-CM

## 2019-10-30 DIAGNOSIS — Z950 Presence of cardiac pacemaker: Secondary | ICD-10-CM

## 2019-10-30 DIAGNOSIS — I517 Cardiomegaly: Secondary | ICD-10-CM | POA: Diagnosis not present

## 2019-10-30 DIAGNOSIS — I10 Essential (primary) hypertension: Secondary | ICD-10-CM | POA: Diagnosis present

## 2019-10-30 DIAGNOSIS — Z79890 Hormone replacement therapy (postmenopausal): Secondary | ICD-10-CM

## 2019-10-30 HISTORY — DX: Presence of prosthetic heart valve: Z95.2

## 2019-10-30 HISTORY — DX: Reserved for inherently not codable concepts without codable children: IMO0001

## 2019-10-30 HISTORY — DX: Procedure and treatment not carried out because of patient's decision for reasons of belief and group pressure: Z53.1

## 2019-10-30 HISTORY — PX: TRANSCATHETER AORTIC VALVE REPLACEMENT, CAROTID: SHX6798

## 2019-10-30 HISTORY — PX: TEE WITHOUT CARDIOVERSION: SHX5443

## 2019-10-30 LAB — PROTIME-INR
INR: 1.1 (ref 0.8–1.2)
Prothrombin Time: 14.1 seconds (ref 11.4–15.2)

## 2019-10-30 LAB — POCT I-STAT, CHEM 8
BUN: 17 mg/dL (ref 8–23)
BUN: 16 mg/dL (ref 8–23)
BUN: 16 mg/dL (ref 8–23)
Calcium, Ion: 1.25 mmol/L (ref 1.15–1.40)
Calcium, Ion: 1.23 mmol/L (ref 1.15–1.40)
Calcium, Ion: 1.2 mmol/L (ref 1.15–1.40)
Chloride: 110 mmol/L (ref 98–111)
Chloride: 110 mmol/L (ref 98–111)
Chloride: 112 mmol/L — ABNORMAL HIGH (ref 98–111)
Creatinine, Ser: 0.7 mg/dL (ref 0.44–1.00)
Creatinine, Ser: 0.7 mg/dL (ref 0.44–1.00)
Creatinine, Ser: 0.7 mg/dL (ref 0.44–1.00)
Glucose, Bld: 112 mg/dL — ABNORMAL HIGH (ref 70–99)
Glucose, Bld: 98 mg/dL (ref 70–99)
Glucose, Bld: 136 mg/dL — ABNORMAL HIGH (ref 70–99)
HCT: 41 % (ref 36.0–46.0)
HCT: 33 % — ABNORMAL LOW (ref 36.0–46.0)
HCT: 38 % (ref 36.0–46.0)
Hemoglobin: 13.9 g/dL (ref 12.0–15.0)
Hemoglobin: 11.2 g/dL — ABNORMAL LOW (ref 12.0–15.0)
Hemoglobin: 12.9 g/dL (ref 12.0–15.0)
Potassium: 3.9 mmol/L (ref 3.5–5.1)
Potassium: 3.6 mmol/L (ref 3.5–5.1)
Potassium: 3.8 mmol/L (ref 3.5–5.1)
Sodium: 145 mmol/L (ref 135–145)
Sodium: 145 mmol/L (ref 135–145)
Sodium: 144 mmol/L (ref 135–145)
TCO2: 25 mmol/L (ref 22–32)
TCO2: 26 mmol/L (ref 22–32)
TCO2: 20 mmol/L — ABNORMAL LOW (ref 22–32)

## 2019-10-30 SURGERY — IMPLANTATION, AORTIC VALVE, TRANSCATHETER, CAROTID ARTERY APPROACH
Anesthesia: Monitor Anesthesia Care | Site: Neck

## 2019-10-30 MED ORDER — ACETAMINOPHEN 650 MG RE SUPP: 650.0000 mg | Freq: Four times a day (QID) | RECTAL | Status: DC | PRN

## 2019-10-30 MED ORDER — CHLORHEXIDINE GLUCONATE 0.12 % MT SOLN
15.0000 mL | Freq: Once | OROMUCOSAL | Status: AC
  Administered 2019-10-30 – 2019-10-31 (×2): 15 mL via OROMUCOSAL
  Filled 2019-10-30: qty 15

## 2019-10-30 MED ORDER — LIDOCAINE 2% (20 MG/ML) 5 ML SYRINGE
INTRAMUSCULAR | Status: DC | PRN
  Administered 2019-10-30: 40 mg via INTRAVENOUS

## 2019-10-30 MED ORDER — SODIUM CHLORIDE 0.9 % IV SOLN
INTRAVENOUS | Status: AC
  Filled 2019-10-30 (×3): qty 1.2

## 2019-10-30 MED ORDER — NITROGLYCERIN IN D5W 200-5 MCG/ML-% IV SOLN
0.0000 ug/min | INTRAVENOUS | Status: DC
  Administered 2019-10-30: 30 ug/min via INTRAVENOUS

## 2019-10-30 MED ORDER — GABAPENTIN 300 MG PO CAPS
300.0000 mg | ORAL_CAPSULE | Freq: Two times a day (BID) | ORAL | Status: DC
  Administered 2019-10-30 – 2019-10-31 (×2): 300 mg via ORAL
  Filled 2019-10-30 (×2): qty 1

## 2019-10-30 MED ORDER — SODIUM CHLORIDE 0.9 % IV SOLN: INTRAVENOUS | Status: AC

## 2019-10-30 MED ORDER — DEXAMETHASONE SODIUM PHOSPHATE 10 MG/ML IJ SOLN
INTRAMUSCULAR | Status: DC | PRN
  Administered 2019-10-30: 4 mg via INTRAVENOUS

## 2019-10-30 MED ORDER — SUGAMMADEX SODIUM 200 MG/2ML IV SOLN
INTRAVENOUS | Status: DC | PRN
  Administered 2019-10-30: 100 mg via INTRAVENOUS

## 2019-10-30 MED ORDER — MORPHINE SULFATE (PF) 10 MG/ML IV SOLN: 1.0000 mg | INTRAVENOUS | Status: DC | PRN

## 2019-10-30 MED ORDER — OXYCODONE HCL 5 MG PO TABS: 5.0000 mg | ORAL_TABLET | ORAL | Status: DC | PRN

## 2019-10-30 MED ORDER — LACTATED RINGERS IV SOLN: INTRAVENOUS | Status: DC | PRN

## 2019-10-30 MED ORDER — FERROUS SULFATE 325 (65 FE) MG PO TABS
325.0000 mg | ORAL_TABLET | Freq: Every day | ORAL | Status: DC
  Administered 2019-10-30: 325 mg via ORAL
  Filled 2019-10-30: qty 1

## 2019-10-30 MED ORDER — SODIUM CHLORIDE 0.9 % IV SOLN: 250.0000 mL | INTRAVENOUS | Status: DC | PRN

## 2019-10-30 MED ORDER — FENTANYL CITRATE (PF) 250 MCG/5ML IJ SOLN
INTRAMUSCULAR | Status: AC
  Filled 2019-10-30: qty 5

## 2019-10-30 MED ORDER — NITROGLYCERIN IN D5W 200-5 MCG/ML-% IV SOLN
INTRAVENOUS | Status: AC
  Filled 2019-10-30: qty 250

## 2019-10-30 MED ORDER — PROPOFOL 500 MG/50ML IV EMUL
INTRAVENOUS | Status: DC | PRN
  Administered 2019-10-30: 50 ug/kg/min via INTRAVENOUS

## 2019-10-30 MED ORDER — FENTANYL CITRATE (PF) 100 MCG/2ML IJ SOLN
INTRAMUSCULAR | Status: DC | PRN
  Administered 2019-10-30: 50 ug via INTRAVENOUS
  Administered 2019-10-30: 100 ug via INTRAVENOUS

## 2019-10-30 MED ORDER — LIDOCAINE 2% (20 MG/ML) 5 ML SYRINGE
INTRAMUSCULAR | Status: AC
  Filled 2019-10-30: qty 10

## 2019-10-30 MED ORDER — PROTAMINE SULFATE 10 MG/ML IV SOLN
INTRAVENOUS | Status: DC | PRN
  Administered 2019-10-30: 10 mg via INTRAVENOUS

## 2019-10-30 MED ORDER — PROPOFOL 10 MG/ML IV BOLUS
INTRAVENOUS | Status: DC | PRN
  Administered 2019-10-30: 60 mg via INTRAVENOUS

## 2019-10-30 MED ORDER — VANCOMYCIN HCL IN DEXTROSE 1-5 GM/200ML-% IV SOLN
1000.0000 mg | Freq: Once | INTRAVENOUS | Status: AC
  Administered 2019-10-31: 02:00:00 1000 mg via INTRAVENOUS
  Filled 2019-10-30: qty 200

## 2019-10-30 MED ORDER — CHLORHEXIDINE GLUCONATE 4 % EX LIQD: 60.0000 mL | Freq: Once | CUTANEOUS | Status: DC

## 2019-10-30 MED ORDER — SUCRALFATE 1 G PO TABS
1.0000 g | ORAL_TABLET | Freq: Three times a day (TID) | ORAL | Status: DC
  Administered 2019-10-30 – 2019-10-31 (×2): 1 g via ORAL
  Filled 2019-10-30 (×2): qty 1

## 2019-10-30 MED ORDER — HYDRALAZINE HCL 50 MG PO TABS
50.0000 mg | ORAL_TABLET | Freq: Four times a day (QID) | ORAL | Status: DC | PRN
  Administered 2019-10-30: 50 mg via ORAL
  Filled 2019-10-30: qty 1

## 2019-10-30 MED ORDER — METOPROLOL TARTRATE 100 MG PO TABS
100.0000 mg | ORAL_TABLET | Freq: Two times a day (BID) | ORAL | Status: DC
  Administered 2019-10-30 – 2019-10-31 (×2): 100 mg via ORAL
  Filled 2019-10-30 (×2): qty 1

## 2019-10-30 MED ORDER — SODIUM CHLORIDE 0.9 % IV SOLN
1.5000 g | Freq: Two times a day (BID) | INTRAVENOUS | Status: DC
  Administered 2019-10-30 – 2019-10-31 (×2): 1.5 g via INTRAVENOUS
  Filled 2019-10-30 (×2): qty 1.5

## 2019-10-30 MED ORDER — SODIUM CHLORIDE 0.9 % IV SOLN: INTRAVENOUS | Status: DC

## 2019-10-30 MED ORDER — SODIUM CHLORIDE 0.9% FLUSH: 3.0000 mL | INTRAVENOUS | Status: DC | PRN

## 2019-10-30 MED ORDER — METOPROLOL TARTRATE 5 MG/5ML IV SOLN
INTRAVENOUS | Status: AC
  Filled 2019-10-30: qty 5

## 2019-10-30 MED ORDER — HEPARIN SODIUM (PORCINE) 1000 UNIT/ML IJ SOLN
INTRAMUSCULAR | Status: DC | PRN
  Administered 2019-10-30: 7000 [IU] via INTRAVENOUS

## 2019-10-30 MED ORDER — LACTATED RINGERS IV SOLN: INTRAVENOUS | Status: DC

## 2019-10-30 MED ORDER — SODIUM CHLORIDE 0.9 % IV SOLN
INTRAVENOUS | Status: DC | PRN
  Administered 2019-10-30: 1500 mL

## 2019-10-30 MED ORDER — FAMOTIDINE 20 MG PO TABS
40.0000 mg | ORAL_TABLET | Freq: Every day | ORAL | Status: DC
  Administered 2019-10-31: 40 mg via ORAL
  Filled 2019-10-30: qty 2

## 2019-10-30 MED ORDER — ALPRAZOLAM 0.5 MG PO TABS: 0.5000 mg | ORAL_TABLET | Freq: Two times a day (BID) | ORAL | Status: DC | PRN

## 2019-10-30 MED ORDER — PHENYLEPHRINE 40 MCG/ML (10ML) SYRINGE FOR IV PUSH (FOR BLOOD PRESSURE SUPPORT)
PREFILLED_SYRINGE | INTRAVENOUS | Status: AC
  Filled 2019-10-30: qty 30

## 2019-10-30 MED ORDER — ROCURONIUM BROMIDE 10 MG/ML (PF) SYRINGE
PREFILLED_SYRINGE | INTRAVENOUS | Status: AC
  Filled 2019-10-30: qty 20

## 2019-10-30 MED ORDER — ACETAMINOPHEN 325 MG PO TABS
650.0000 mg | ORAL_TABLET | Freq: Four times a day (QID) | ORAL | Status: DC | PRN
  Administered 2019-10-30 – 2019-10-31 (×3): 650 mg via ORAL
  Filled 2019-10-30 (×4): qty 2

## 2019-10-30 MED ORDER — ONDANSETRON HCL 4 MG/2ML IJ SOLN: 4.0000 mg | Freq: Four times a day (QID) | INTRAMUSCULAR | Status: DC | PRN

## 2019-10-30 MED ORDER — IODIXANOL 320 MG/ML IV SOLN
INTRAVENOUS | Status: DC | PRN
  Administered 2019-10-30: 14:00:00 59.8 mL via INTRA_ARTERIAL

## 2019-10-30 MED ORDER — MORPHINE SULFATE (PF) 2 MG/ML IV SOLN: 1.0000 mg | INTRAVENOUS | Status: DC | PRN

## 2019-10-30 MED ORDER — ONDANSETRON HCL 4 MG/2ML IJ SOLN: INTRAMUSCULAR | Status: DC | PRN

## 2019-10-30 MED ORDER — LORATADINE 10 MG PO TABS
10.0000 mg | ORAL_TABLET | Freq: Every day | ORAL | Status: DC
  Administered 2019-10-30: 10 mg via ORAL
  Filled 2019-10-30: qty 1

## 2019-10-30 MED ORDER — VERAPAMIL HCL ER 120 MG PO TBCR
120.0000 mg | EXTENDED_RELEASE_TABLET | Freq: Two times a day (BID) | ORAL | Status: DC
  Administered 2019-10-30 – 2019-10-31 (×2): 120 mg via ORAL
  Filled 2019-10-30 (×3): qty 1

## 2019-10-30 MED ORDER — PROPOFOL 10 MG/ML IV BOLUS
INTRAVENOUS | Status: AC
  Filled 2019-10-30: qty 20

## 2019-10-30 MED ORDER — PHENYLEPHRINE HCL-NACL 20-0.9 MG/250ML-% IV SOLN
0.0000 ug/min | INTRAVENOUS | Status: DC
  Filled 2019-10-30: qty 250

## 2019-10-30 MED ORDER — ESMOLOL HCL 100 MG/10ML IV SOLN
INTRAVENOUS | Status: DC | PRN
  Administered 2019-10-30: 40 mg via INTRAVENOUS

## 2019-10-30 MED ORDER — ROCURONIUM BROMIDE 10 MG/ML (PF) SYRINGE
PREFILLED_SYRINGE | INTRAVENOUS | Status: DC | PRN
  Administered 2019-10-30: 60 mg via INTRAVENOUS

## 2019-10-30 MED ORDER — LEVOTHYROXINE SODIUM 50 MCG PO TABS
50.0000 ug | ORAL_TABLET | Freq: Every day | ORAL | Status: DC
  Administered 2019-10-31: 50 ug via ORAL
  Filled 2019-10-30: qty 1

## 2019-10-30 MED ORDER — CHLORHEXIDINE GLUCONATE 4 % EX LIQD: 30.0000 mL | CUTANEOUS | Status: DC

## 2019-10-30 MED ORDER — SODIUM CHLORIDE 0.9% FLUSH
3.0000 mL | Freq: Two times a day (BID) | INTRAVENOUS | Status: DC
  Administered 2019-10-30 – 2019-10-31 (×2): 3 mL via INTRAVENOUS

## 2019-10-30 MED ORDER — ONDANSETRON HCL 4 MG/2ML IJ SOLN
INTRAMUSCULAR | Status: DC | PRN
  Administered 2019-10-30: 4 mg via INTRAVENOUS

## 2019-10-30 MED ORDER — TRAMADOL HCL 50 MG PO TABS: 50.0000 mg | ORAL_TABLET | ORAL | Status: DC | PRN

## 2019-10-30 SURGICAL SUPPLY — 79 items
BAG DECANTER FOR FLEXI CONT (MISCELLANEOUS) ×2 IMPLANT
BAG SNAP BAND KOVER 36X36 (MISCELLANEOUS) ×4 IMPLANT
CABLE ADAPT CONN TEMP 6FT (ADAPTER) ×4 IMPLANT
CATH DIAG EXPO 6F AL1 (CATHETERS) ×2 IMPLANT
CATH DIAG EXPO 6F FR4 (CATHETERS) ×2 IMPLANT
CATH DIAG EXPO 6F VENT PIG 145 (CATHETERS) ×8 IMPLANT
CATH INFINITI 6F MPB2 (CATHETERS) ×2 IMPLANT
CATH S G BIP PACING (CATHETERS) ×4 IMPLANT
CLIP VESOCCLUDE MED 24/CT (CLIP) ×2 IMPLANT
CLIP VESOCCLUDE SM WIDE 24/CT (CLIP) ×2 IMPLANT
CNTNR URN SCR LID CUP LEK RST (MISCELLANEOUS) ×4 IMPLANT
CONT SPEC 4OZ STRL OR WHT (MISCELLANEOUS) ×4
COVER BACK TABLE 80X110 HD (DRAPES) ×8 IMPLANT
COVER DOME SNAP 22 D (MISCELLANEOUS) ×2 IMPLANT
DERMABOND ADHESIVE PROPEN (GAUZE/BANDAGES/DRESSINGS) ×2
DERMABOND ADVANCED (GAUZE/BANDAGES/DRESSINGS) ×2
DERMABOND ADVANCED .7 DNX12 (GAUZE/BANDAGES/DRESSINGS) ×2 IMPLANT
DERMABOND ADVANCED .7 DNX6 (GAUZE/BANDAGES/DRESSINGS) IMPLANT
DRAPE INCISE IOBAN 66X45 STRL (DRAPES) ×2 IMPLANT
DRSG TEGADERM 4X4.5 CHG (GAUZE/BANDAGES/DRESSINGS) ×2 IMPLANT
DRSG TEGADERM 4X4.75 (GAUZE/BANDAGES/DRESSINGS) ×6 IMPLANT
ELECT CAUTERY BLADE 6.4 (BLADE) IMPLANT
ELECT REM PT RETURN 9FT ADLT (ELECTROSURGICAL) ×8
ELECTRODE REM PT RTRN 9FT ADLT (ELECTROSURGICAL) ×4 IMPLANT
FELT TEFLON 6X6 (MISCELLANEOUS) ×2 IMPLANT
GAUZE SPONGE 4X4 12PLY STRL (GAUZE/BANDAGES/DRESSINGS) ×4 IMPLANT
GLOVE BIO SURGEON STRL SZ7.5 (GLOVE) ×2 IMPLANT
GLOVE BIO SURGEON STRL SZ8 (GLOVE) ×2 IMPLANT
GLOVE ORTHO TXT STRL SZ7.5 (GLOVE) ×2 IMPLANT
GOWN STRL REUS W/ TWL LRG LVL3 (GOWN DISPOSABLE) IMPLANT
GOWN STRL REUS W/ TWL XL LVL3 (GOWN DISPOSABLE) ×2 IMPLANT
GOWN STRL REUS W/TWL LRG LVL3 (GOWN DISPOSABLE) ×8
GOWN STRL REUS W/TWL XL LVL3 (GOWN DISPOSABLE) ×6
GUIDEWIRE SAF TJ AMPL .035X180 (WIRE) ×2 IMPLANT
GUIDEWIRE SAFE TJ AMPLATZ EXST (WIRE) ×4 IMPLANT
INSERT FOGARTY SM (MISCELLANEOUS) ×4 IMPLANT
KIT BASIN OR (CUSTOM PROCEDURE TRAY) ×4 IMPLANT
KIT DILATOR VASC 18G NDL (KITS) ×2 IMPLANT
KIT HEART LEFT (KITS) ×4 IMPLANT
KIT SUCTION CATH 14FR (SUCTIONS) ×2 IMPLANT
KIT TURNOVER KIT B (KITS) ×4 IMPLANT
LOOP VESSEL MAXI BLUE (MISCELLANEOUS) ×2 IMPLANT
LOOP VESSEL MINI RED (MISCELLANEOUS) ×2 IMPLANT
NDL PERC 18GX7CM (NEEDLE) ×2 IMPLANT
NEEDLE 22X1 1/2 (OR ONLY) (NEEDLE) IMPLANT
NEEDLE PERC 18GX7CM (NEEDLE) ×4 IMPLANT
NS IRRIG 1000ML POUR BTL (IV SOLUTION) ×12 IMPLANT
PACK ENDOVASCULAR (PACKS) ×4 IMPLANT
PAD ARMBOARD 7.5X6 YLW CONV (MISCELLANEOUS) ×8 IMPLANT
PAD ELECT DEFIB RADIOL ZOLL (MISCELLANEOUS) ×4 IMPLANT
PENCIL BUTTON HOLSTER BLD 10FT (ELECTRODE) ×4 IMPLANT
POSITIONER HEAD DONUT 9IN (MISCELLANEOUS) ×4 IMPLANT
SHEATH BRITE TIP 7FR 35CM (SHEATH) ×4 IMPLANT
SHEATH PINNACLE 6F 10CM (SHEATH) ×6 IMPLANT
SHEATH PINNACLE 8F 10CM (SHEATH) ×4 IMPLANT
SLEEVE REPOSITIONING LENGTH 30 (MISCELLANEOUS) ×4 IMPLANT
SPONGE LAP 4X18 RFD (DISPOSABLE) ×2 IMPLANT
STOPCOCK MORSE 400PSI 3WAY (MISCELLANEOUS) ×8 IMPLANT
SUT ETHIBOND X763 2 0 SH 1 (SUTURE) IMPLANT
SUT GORETEX CV 4 TH 22 36 (SUTURE) ×4 IMPLANT
SUT GORETEX CV4 TH-18 (SUTURE) ×2 IMPLANT
SUT MNCRL AB 3-0 PS2 18 (SUTURE) ×2 IMPLANT
SUT PROLENE 6 0 C 1 30 (SUTURE) ×4 IMPLANT
SUT SILK  1 MH (SUTURE) ×2
SUT SILK 1 MH (SUTURE) ×2 IMPLANT
SUT VIC AB 2-0 CT1 27 (SUTURE) ×4
SUT VIC AB 2-0 CT1 TAPERPNT 27 (SUTURE) IMPLANT
SUT VIC AB 3-0 SH 8-18 (SUTURE) ×2 IMPLANT
SYR 50ML LL SCALE MARK (SYRINGE) ×6 IMPLANT
SYR BULB IRRIGATION 50ML (SYRINGE) IMPLANT
SYR CONTROL 10ML LL (SYRINGE) IMPLANT
TOWEL GREEN STERILE (TOWEL DISPOSABLE) ×4 IMPLANT
TOWEL GREEN STERILE FF (TOWEL DISPOSABLE) ×4 IMPLANT
TRANSDUCER W/STOPCOCK (MISCELLANEOUS) ×8 IMPLANT
TRAY FOLEY SLVR 16FR TEMP STAT (SET/KITS/TRAYS/PACK) ×2 IMPLANT
VALVE HRT TRANSCATH CERT 23MM (Valve) ×2 IMPLANT
WIRE EMERALD 3MM-J .035X150CM (WIRE) ×4 IMPLANT
WIRE EMERALD 3MM-J .035X260CM (WIRE) ×4 IMPLANT
WIRE EMERALD ST .035X260CM (WIRE) ×2 IMPLANT

## 2019-10-30 NOTE — Discharge Instructions (Signed)
ACTIVITY AND EXERCISE °• Daily activity and exercise are an important part of your recovery. People recover at different rates depending on their general health and type of valve procedure. °• Most people recovering from TAVR feel better relatively quickly  °• No lifting, pushing, pulling more than 10 pounds (examples to avoid: groceries, vacuuming, gardening, golfing): °            - For one week with a procedure through the groin. °            - For six weeks for procedures through the chest wall or neck. °NOTE: You will typically see one of our providers 7-14 days after your procedure to discuss WHEN TO RESUME the above activities.  °  °  °DRIVING °• Do not drive until you are seen for follow up and cleared by a provider. Generally, we ask patient to not drive for 1 week after their procedure. °• If you have been told by your doctor in the past that you may not drive, you must talk with him/her before you begin driving again. °  °DRESSING °• Groin site: you may leave the clear dressing over the site for up to one week or until it falls off. °  °HYGIENE °• If you had a femoral (leg) procedure, you may take a shower when you return home. After the shower, pat the site dry. Do NOT use powder, oils or lotions in your groin area until the site has completely healed. °• If you had a chest procedure, you may shower when you return home unless specifically instructed not to by your discharging practitioner. °            - DO NOT scrub incision; pat dry with a towel. °            - DO NOT apply any lotions, oils, powders to the incision. °            - No tub baths / swimming for at least 2 weeks. °• If you notice any fevers, chills, increased pain, swelling, bleeding or pus, please contact your doctor. °  °ADDITIONAL INFORMATION °• If you are going to have an upcoming dental procedure, please contact our office as you will require antibiotics ahead of time to prevent infection on your heart valve.  ° ° °If you have any  questions or concerns you can call the structural heart phone during normal business hours 8am-4pm. If you have an urgent need after hours or weekends please call [phone] to talk to the on call provider for general cardiology. If you have an emergency that requires immediate attention, please call 911.  ° ° °After TAVR Checklist ° °Check  Test Description  ° Follow up appointment in 1-2 weeks  You will see our structural heart physician assistant, Katie Kaylan Friedmann. Your incision sites will be checked and you will be cleared to drive and resume all normal activities if you are doing well.    ° 1 month echo and follow up  You will have an echo to check on your new heart valve and be seen back in the office by Katie Eldin Bonsell. Many times the echo is not read by your appointment time, but Katie will call you later that day or the following day to report your results.  ° Follow up with your primary cardiologist You will need to be seen by your primary cardiologist in the following 3-6 months after your 1 month appointment in the valve   clinic. Often times your Plavix or Aspirin will be discontinued during this time, but this is decided on a case by case basis.   ° 1 year echo and follow up You will have another echo to check on your heart valve after 1 year and be seen back in the office by Katie Ying Blankenhorn. This your last structural heart visit.  ° Bacterial endocarditis prophylaxis  You will have to take antibiotics for the rest of your life before all dental procedures (even teeth cleanings) to protect your heart valve. Antibiotics are also required before some surgeries. Please check with your cardiologist before scheduling any surgeries. Also, please make sure to tell us if you have a penicillin allergy as you will require an alternative antibiotic.   ° ° °

## 2019-10-30 NOTE — Anesthesia Preprocedure Evaluation (Addendum)
Anesthesia Evaluation  Patient identified by MRN, date of birth, ID band Patient awake    Reviewed: Allergy & Precautions, NPO status , Patient's Chart, lab work & pertinent test results, reviewed documented beta blocker date and time   History of Anesthesia Complications Negative for: history of anesthetic complications  Airway Mallampati: II  TM Distance: >3 FB Neck ROM: Full    Dental  (+) Upper Dentures,    Pulmonary shortness of breath, neg recent URI,    breath sounds clear to auscultation       Cardiovascular hypertension, Pt. on medications and Pt. on home beta blockers (-) angina+ CAD and +CHF  (-) Past MI + pacemaker + Valvular Problems/Murmurs AS and AI  Rhythm:Regular + Systolic murmurs Cath:   Ost LAD to Prox LAD lesion is 60% stenosed.  1st Sept lesion is 90% stenosed.  1st Diag lesion is 60% stenosed.  Ost RCA to Prox RCA lesion is 50% stenosed.  LV end diastolic pressure is normal.  There is severe aortic valve stenosis.  Hemodynamic findings consistent with aortic valve stenosis.   1. Moderate nonobstructive CAD except in a small septal perforator branch 2. Severe low flow/low gradient aortic stenosis. AVA 0.84 with index 0.61.  Mean gradient 11 mmHg 3. Normal right heart pressures 4. Reduced cardiac output with index 2.11.  5. Normal LV filling pressures.     1. Left ventricular ejection fraction, by visual estimation, is 60 to  65%. The left ventricle has normal function. There is moderately increased  left ventricular hypertrophy.  2. Left ventricular diastolic function could not be evaluated.  3. Small left ventricular internal cavity size.  4. The left ventricle has no regional wall motion abnormalities.  5. Global right ventricle has normal systolic function.The right  ventricular size is normal. No increase in right ventricular wall  thickness.  6. Left atrial size was moderately  dilated.  7. The mitral valve is degenerative. Mild mitral valve regurgitation.  8. The tricuspid valve is grossly normal.  9. The aortic valve is severely calcified with restricted leaflet motion.  Vmax 3.3 m/s, mean gradient 25 mmHG, DI 0.21, AVA 0.60 cm2. SV=43 cc and  SVi=31 cc/m2. Suspect there is paradoxical low-flow low-gradient severe  aortic stenosis. Moderate aortic  regurgitation is present. Recommend an aortic valve calcium score for  clarification of AoV stenosis severity.  10. Aortic valve regurgitation is moderate.  11. The aortic valve is tricuspid. Aortic valve regurgitation is moderate.  Moderate to severe aortic valve stenosis.  12. Mildly elevated pulmonary artery systolic pressure.  13. The tricuspid regurgitant velocity is 2.68 m/s, and with an assumed  right atrial pressure of 15 mmHg, the estimated right ventricular systolic  pressure is mildly elevated at 43.8 mmHg.  14. A pacer wire is visualized in the RA and RV.  15. The inferior vena cava is dilated in size with <50% respiratory  variability, suggesting right atrial pressure of 15 mmHg.    Neuro/Psych PSYCHIATRIC DISORDERS Anxiety CVA    GI/Hepatic Neg liver ROS, GERD  Medicated and Controlled,  Endo/Other  Hypothyroidism   Renal/GU CRFRenal disease     Musculoskeletal  (+) Arthritis ,   Abdominal   Peds  Hematology negative hematology ROS (+)   Anesthesia Other Findings   Reproductive/Obstetrics                            Anesthesia Physical Anesthesia Plan  ASA: IV  Anesthesia  Plan: General   Post-op Pain Management:    Induction: Intravenous  PONV Risk Score and Plan: 3 and Treatment may vary due to age or medical condition, Ondansetron and Dexamethasone  Airway Management Planned: Oral ETT  Additional Equipment: Arterial line  Intra-op Plan:   Post-operative Plan: Extubation in OR and Possible Post-op intubation/ventilation  Informed Consent: I  have reviewed the patients History and Physical, chart, labs and discussed the procedure including the risks, benefits and alternatives for the proposed anesthesia with the patient or authorized representative who has indicated his/her understanding and acceptance.     Dental advisory given  Plan Discussed with: CRNA and Surgeon  Anesthesia Plan Comments:        Anesthesia Quick Evaluation

## 2019-10-30 NOTE — Progress Notes (Signed)
  [facility]   MULTIDISCIPLINARY HEART VALVE TEAM  Pt hypertensive and on nitro gtt. Has a headache. Will give tylenol and stop nitro gtt and restart some of her home meds. No need to worry about AVN blocking agents as she has a PPM. Will hold on lasix and losartan given mild renal insufficiency and contrast dye today. Can give PRN hydralazine as needed overnight. If renal function stable will resume diuretic and ARB tomorrow.   Angelena Form PA-C  [facility]

## 2019-10-30 NOTE — Anesthesia Procedure Notes (Signed)
Arterial Line Insertion Start/End2/23/2021 10:00 AM, 10/30/2019 10:10 AM Performed by: Wilburn Cornelia, CRNA, CRNA  Patient location: Pre-op. Preanesthetic checklist: patient identified, IV checked, site marked, risks and benefits discussed, surgical consent, monitors and equipment checked, pre-op evaluation, timeout performed and anesthesia consent Lidocaine 1% used for infiltration Right, radial was placed Catheter size: 20 G Hand hygiene performed  and maximum sterile barriers used  Allen's test indicative of satisfactory collateral circulation Attempts: 2 Procedure performed without using ultrasound guided technique. Following insertion, dressing applied. Post procedure assessment: normal  Post procedure complications: local hematoma. Patient tolerated the procedure well with no immediate complications.

## 2019-10-30 NOTE — CV Procedure (Signed)
[facility]  TAVR OPERATIVE NOTE   Date of Procedure:  10/30/2019  Preoperative Diagnosis: Severe Aortic Stenosis   Postoperative Diagnosis: Same   Procedure:    Transcatheter Aortic Valve Replacement - TransCarotid Approach  Edwards Sapien 3 THV (size 23 mm, model # [id number], serial # [id number])   Co-Surgeons:  [name], MD and Valentina Gu. Roxy Manns, MD  Anesthesiologist:  Ermalene Postin  Echocardiographer:  Meda Coffee  Pre-operative Echo Findings:  Severe aortic stenosis  Normal left ventricular systolic function  Post-operative Echo Findings:  Mild paravalvular leak  Normal left ventricular systolic function  BRIEF CLINICAL NOTE AND INDICATIONS FOR SURGERY  86 yo female with history of chronic diastolic CHF, PAF, tachy-brady syndrome s/p pacemaker, HTN, HLD, gastric cancer, renal cell carcinoma s/p nephrectomy, CKD stage 3 , anemia, recent bladder cancer and severe aortic stenosis who is here today for TAVR. She has been followed for moderate aortic stenosis. Echo 09/17/19 with LVEF=60-65%, moderate LVH. Mild MR. The aortic valve leaflets are thickened and calcified with limited leaflet mobility. Mean gradient 25 mmHg, peak gradient 45 mmHg, AVA 0.6 cm2, dimensionless index 0.21. This is consistent with severe, paradoxical, low flow, low gradient AS. Moderate AI. She is a Sales promotion account executive witness. She has had progressive dyspnea and fatigue with some fluid retention. No chest pain. No syncope. Cardiac cath with non-obstructive CAD. CTA with high grade iliac artery disease limiting use of her femoral arteries for primary access for the valve replacement.   During the course of the patient's preoperative work up they have been evaluated comprehensively by a multidisciplinary team of specialists coordinated through the [facility] in the [facility].  They have been demonstrated to suffer from symptomatic severe aortic stenosis  as noted above. The patient has been counseled extensively as to the relative risks and benefits of all options for the treatment of severe aortic stenosis including long term medical therapy, conventional surgery for aortic valve replacement, and transcatheter aortic valve replacement.  The patient has been independently evaluated by Dr. Roxy Manns with CT surgery and they are felt to be at high risk for conventional surgical aortic valve replacement. The surgeon indicated the patient would be a poor candidate for conventional surgery. Based upon review of all of the patient's preoperative diagnostic tests they are felt to be candidate for transcatheter aortic valve replacement using the transfemoral approach as an alternative to high risk conventional surgery.    Following the decision to proceed with transcatheter aortic valve replacement, a discussion has been held regarding what types of management strategies would be attempted intraoperatively in the event of life-threatening complications, including whether or not the patient would be considered a candidate for the use of cardiopulmonary bypass and/or conversion to open sternotomy for attempted surgical intervention.  The patient has been advised of a variety of complications that might develop peculiar to this approach including but not limited to risks of death, stroke, paravalvular leak, aortic dissection or other major vascular complications, aortic annulus rupture, device embolization, cardiac rupture or perforation, acute myocardial infarction, arrhythmia, heart block or bradycardia requiring permanent pacemaker placement, congestive heart failure, respiratory failure, renal failure, pneumonia, infection, other late complications related to structural valve deterioration or migration, or other complications that might ultimately cause a temporary or permanent loss of functional independence or other long term morbidity.  The patient provides full informed  consent for the procedure as described and all questions were answered preoperatively.    DETAILS OF THE  OPERATIVE PROCEDURE  PREPARATION:   The patient is brought to the operating room on the above mentioned date and central monitoring was established by the anesthesia team including placement of a radial arterial line. The patient is placed in the supine position on the operating table.  Intravenous antibiotics are administered. General anesthesia is used.   Baseline transesophageal echocardiogram was performed. The patient's chest, abdomen, both groins, and both lower extremities are prepared and draped in a sterile manner. A time out procedure is performed.   PERIPHERAL ACCESS:   Using the modified Seldinger technique, femoral arterial and venous access were obtained with placement of 6 Fr sheaths on the left side using u/s guidance.  A pigtail diagnostic catheter was passed through the femoral arterial sheath under fluoroscopic guidance into the aortic root.  A temporary transvenous pacemaker catheter was passed through the femoral venous sheath under fluoroscopic guidance into the right ventricle.  The pacemaker was tested to ensure stable lead placement and pacemaker capture. Aortic root angiography was performed in order to determine the optimal angiographic angle for valve deployment.  TRANSCAROTID ACCESS: Please see Dr. Ricard Dillon operative report for details. The patient was heparinized systemically and ACT verified > 250 seconds.    A 6 French sheath was placed in the left carotid artery. An AL-1 catheter was used to direct a straight-tip exchange length wire across the native aortic valve into the left ventricle. This was exchanged out for a pigtail catheter and position was confirmed in the LV apex. Simultaneous LV and Ao pressures were recorded.  The pigtail catheter was then exchanged for an Amplatz Extra-stiff wire in the LV apex. A 18 Fr sheath was introduced into the left carotid  artery.   TRANSCATHETER HEART VALVE DEPLOYMENT:  An Edwards Sapien 3 THV (size 23 mm) was prepared and crimped per manufacturer's guidelines, and the proper orientation of the valve is confirmed on the [employer] delivery system. The valve was advanced through the introducer sheath using normal technique until in an appropriate position in the ascending aorta beyond the sheath tip. The valve was carefully positioned across the aortic valve annulus. Once final position of the valve has been confirmed by angiographic assessment, the valve is deployed while temporarily holding ventilation and during rapid ventricular pacing to maintain systolic blood pressure < 50 mmHg and pulse pressure < 10 mmHg. The balloon inflation is held for >3 seconds after reaching full deployment volume. Once the balloon has fully deflated the balloon is retracted into the ascending aorta and valve function is assessed using TEE. There is felt to be mild paravalvular leak and no central aortic insufficiency.  The patient's hemodynamic recovery following valve deployment is good.  The deployment balloon and guidewire are both removed. Echo demostrated acceptable post-procedural gradients, stable mitral valve function, and mild AI.   PROCEDURE COMPLETION:  The sheath was then removed. Please see Dr. Guy Sandifer operative note for details of the surgical closure of the artery and wound. Protamine was administered once carotid arterial repair was complete. The temporary pacemaker, pigtail catheters and femoral sheaths were removed with manual pressure used for hemostasis.   The patient tolerated the procedure well and is transported to the surgical intensive care in stable condition. There were no immediate intraoperative complications. All sponge instrument and needle counts are verified correct at completion of the operation.   No blood products were administered during the operation.  The patient received a total of 59.8 mL of  intravenous contrast during the procedure.  [name] MD 10/30/2019 1:51 PM

## 2019-10-30 NOTE — Transfer of Care (Signed)
Immediate Anesthesia Transfer of Care Note  Patient: [name]  Procedure(s) Performed: TRANSCATHETER AORTIC VALVE REPLACEMENT, LEFT CAROTID (Left Neck) TRANSESOPHAGEAL ECHOCARDIOGRAM (TEE) (N/A )  Patient Location: PACU  Anesthesia Type:General  Level of Consciousness: awake  Airway & Oxygen Therapy: Patient Spontanous Breathing and Patient connected to face mask oxygen  Post-op Assessment: Report given to RN and Post -op Vital signs reviewed and stable  Post vital signs: Reviewed and stable  Last Vitals:  Vitals Value Taken Time  BP 132/46 10/30/19 1411  Temp    Pulse 64 10/30/19 1413  Resp 15 10/30/19 1413  SpO2 99 % 10/30/19 1413  Vitals shown include unvalidated device data.  Last Pain:  Vitals:   10/30/19 0942  TempSrc:   PainSc: 0-No pain      Patients Stated Pain Goal: 3 ([id number])  Complications: No apparent anesthesia complications

## 2019-10-30 NOTE — Progress Notes (Signed)
Pt received from PACU. Pt C/Oax4. Pt vitals stable. CHG bath given. Telebox 20 applied/ccmd notified. Call bell within reach. Pt oriented to room. Will continue to monitor.Jerald Kief, RN

## 2019-10-30 NOTE — Progress Notes (Signed)
  [facility] VALVE TEAM  Patient doing well s/p TAVR. She is hemodynamically stable but hypertensive requiring nitro gtt. Groin and carotid sites are stable. ECG with no high grade block ( has pacemaker). Arterial line discontinued and transferred  to 4E. Plan for early ambulation after bedrest completed and hopeful discharge over the next 24-48 hours.   Angelena Form PA-C  [facility]  Pager [phone]

## 2019-10-30 NOTE — Interval H&P Note (Signed)
History and Physical Interval Note:  10/30/2019 9:26 AM  [name]  has presented today for surgery, with the diagnosis of Severe Aortic Stenosis.  The various methods of treatment have been discussed with the patient and family. After consideration of risks, benefits and other options for treatment, the patient has consented to  Procedure(s): TRANSCATHETER AORTIC VALVE REPLACEMENT, LEFT CAROTID (Left) TRANSESOPHAGEAL ECHOCARDIOGRAM (TEE) (N/A) as a surgical intervention.  The patient's history has been reviewed, patient examined, no change in status, stable for surgery.  I have reviewed the patient's chart and labs.  Questions were answered to the patient's satisfaction.     [name]

## 2019-10-30 NOTE — Op Note (Signed)
[facility]   MULTIDISCIPLINARY HEART VALVE TEAM   TAVR OPERATIVE NOTE   Date of Procedure:  10/30/2019  Preoperative Diagnosis: Severe Aortic Stenosis   Postoperative Diagnosis: Same   Procedure:    Transcatheter Aortic Valve Replacement - Open Left Transcarotid Approach  Edwards Sapien 3 THV (size 23 mm, model # 9600TFX, serial # [id number])   Co-Surgeons:  Valentina Gu. Roxy Manns, MD and [name], MD  Anesthesiologist:  Laurie Panda, MD  Echocardiographer:  Ena Dawley, MD  Pre-operative Echo Findings:  Paradoxical Low Flow with Normal EF Severe aortic stenosis  Normal left ventricular systolic function  Post-operative Echo Findings:  Mild paravalvular leak  Normal left ventricular systolic function     BRIEF CLINICAL NOTE AND INDICATIONS FOR SURGERY  Patient is an 86 year old female with history of aortic stenosis, chronic diastolic congestive heart failure, paroxysmal atrial fibrillation on long-term warfarin anticoagulation, tachybradycardia syndrome status post permanent pacemaker placement, hypertension, hyperlipidemia, previous stroke, gastric cancer status post surgical resection, renal cell carcinoma status post nephrectomy, stage III chronic kidney disease, anemia, and bladder cancer treated with vesicular chemotherapy who has been referred for surgical consultation to discuss treatment options for management of severe paradoxical low flow low gradient aortic stenosis.  Patient has a longstanding history of chronic diastolic congestive heart failure and atrial fibrillation.  She was followed for many years by Dr. Mare Ferrari and more recently by Dr. [name].  She underwent permanent pacemaker implantation in the distant past for tachybradycardia syndrome and has been on long-term warfarin anticoagulation for many years.  She has developed progressive exertional shortness of breath, fatigue, and fluid retention over the past 2 years.  She has  required escalating doses of diuretic therapy and she has been staying in persistent atrial fibrillation.  Follow-up transthoracic echocardiogram performed September 17, 2019 findings consistent with paradoxical low flow low gradient severe aortic stenosis.  Ejection fraction was estimated 60 to 65%.  The aortic valve was severely calcified with severely restricted leaflet motion.  Peak velocity across aortic valve measured 3.3 m/s corresponding to mean transvalvular gradient estimated 25 mmHg.  The DVI was notably quite low at 0.21 with aortic valve area calculated 0.60 cm and stroke-volume only 43 mL with stroke-volume index 31 mL/m.  The patient was referred to the multidisciplinary heart valve clinic and has been evaluated previously by Dr. Angelena Form.  Diagnostic cardiac catheterization performed by Dr. [name] on October 09, 2019 revealed nonobstructive coronary artery disease.  Peak to peak and mean transvalvular gradients across aortic valve were measured 19 and 11.4 mmHg respectively corresponding to aortic valve area calculated only 0.84 cm.  CT angiography was performed of the patient was referred for surgical consultation.  During the course of the patient's preoperative work up they have been evaluated comprehensively by a multidisciplinary team of specialists coordinated through the [facility] in the [facility].  They have been demonstrated to suffer from symptomatic severe aortic stenosis as noted above. The patient has been counseled extensively as to the relative risks and benefits of all options for the treatment of severe aortic stenosis including long term medical therapy, conventional surgery for aortic valve replacement, and transcatheter aortic valve replacement.  All questions have been answered, and the patient provides full informed consent for the operation as described.   DETAILS OF THE OPERATIVE PROCEDURE  PREPARATION:    The  patient is brought to the operating room on the above mentioned date and central monitoring was established  by the anesthesia team including placement of a central venous line and radial arterial line. The patient is placed in the supine position on the operating table.  Intravenous antibiotics are administered.  General endotracheal anesthesia is induced uneventfully.  A Foley catheter is placed.  Baseline transesophageal echocardiogram was performed. The patient's neck, chest, abdomen, both groins, and both lower extremities are prepared and draped in a sterile manner. A time out procedure is performed.   PERIPHERAL ACCESS:    Using the modified Seldinger technique, femoral arterial and venous access was obtained with placement of 6 Fr sheaths on the left side.  A pigtail diagnostic catheter was passed through the left femoral arterial sheath under fluoroscopic guidance into the aortic root.  A temporary transvenous pacemaker catheter was passed through the left femoral venous sheath under fluoroscopic guidance into the right ventricle.  The pacemaker was tested to ensure stable lead placement and pacemaker capture. Aortic root angiography was performed in order to determine the optimal angiographic angle for valve deployment.   TRANSCAROTID ACCESS:   A small transverse incision is made overlying the leftcarotid artery approximately 2 cm above the sternal notch.  The incision is completed through the platysma muscle with electrocautery.  The anterior border of the sternocleidomastoid muscle was dissected laterally to expose the carotid artery and internal jugular vein.  Atraumatic vessel loops were placed around the carotid artery.  A pair of concentric pledgeted CV 4 Gore-Tex sutures are placed into the anterior surface of the common carotid artery at the site of planned puncture with concentric diamond shaped designed to avoid lateral constriction of the artery following closure.  The patient was  heparinized systemically and ACT verified > 250 seconds.  The left common carotid artery was cannulated under direct vision and a soft J-wire passed into the proximal aortic arch under fluoroscopic guidance.  A 6 French sheath was passed over the guidewire into the carotid artery.  Through the carotid sheath an R4 catheter was used to direct a straight-tip exchange length wire across the native aortic valve into the left ventricle. This was exchanged out for a pigtail catheter and position was confirmed in the LV apex.  The pigtail catheter was exchanged for an Amplatz Extra-stiff wire in the LV apex.  Echocardiography was utilized to confirm appropriate wire position and no sign of entanglement in the mitral subvalvular apparatus.  An 72 [place] Edwards Certitude alternative access dilator and sheath were advanced over the extra-stiff wire until the tip of the sheath was within the aortic arch proximal to the level of the takeoff of the carotid artery.  The dilator was removed and the sheath flushed with saline.   TRANSCATHETER HEART VALVE DEPLOYMENT:   An Edwards Sapien 3 transcatheter heart valve (size 23 mm, model #9600TFX, serial [id number]) was prepared and crimped per manufacturer's guidelines, and the proper orientation of the valve is confirmed on the [place] Certitude delivery system. The valve was advanced through the sheath into the proximal aortic arch. The valve was carefully positioned across the aortic valve annulus. Once final position of the valve has been confirmed by angiographic assessment, the valve is deployed while temporarily holding ventilation and during rapid ventricular pacing to maintain systolic blood pressure < 50 mmHg and pulse pressure < 10 mmHg. The balloon inflation is held for >3 seconds after reaching full deployment volume. Once the balloon has fully deflated the balloon is retracted into the ascending aorta and valve function is assessed using echocardiography. Initially  there is  felt to be trivial paravalvular leak and no central aortic insufficiency.  The patient's hemodynamic recovery following valve deployment is good.  The deployment balloon and guidewire are both removed.  Subsequent imaging suggested that the paravalvular leak was mild (1+).   PROCEDURE COMPLETION:   The sheath was removed and carotid artery closure performed by securing the Gore-tex pursestring sutures.  Protamine was administered once carotid arterial repair was complete. The temporary pacemaker, pigtail catheters and femoral sheaths were removed with manual pressure used for hemostasis.   The patient tolerated the procedure well and is transported to the surgical intensive care in stable condition. There were no immediate intraoperative complications. All sponge instrument and needle counts are verified correct at completion of the operation.   No blood products were administered during the operation.  The patient received a total of 60 mL of intravenous contrast during the procedure.   Rexene Alberts, MD 10/30/2019 1:58 PM

## 2019-10-30 NOTE — Progress Notes (Signed)
  Echocardiogram Echocardiogram Transesophageal has been performed.  Darlina Sicilian M 10/30/2019, 1:29 PM

## 2019-10-31 ENCOUNTER — Encounter: Payer: Self-pay | Admitting: *Deleted

## 2019-10-31 ENCOUNTER — Inpatient Hospital Stay (HOSPITAL_COMMUNITY): Payer: Medicare Other

## 2019-10-31 DIAGNOSIS — Z952 Presence of prosthetic heart valve: Secondary | ICD-10-CM

## 2019-10-31 LAB — CBC
HCT: 37.1 % (ref 36.0–46.0)
Hemoglobin: 12.4 g/dL (ref 12.0–15.0)
MCH: 32.6 pg (ref 26.0–34.0)
MCHC: 33.4 g/dL (ref 30.0–36.0)
MCV: 97.6 fL (ref 80.0–100.0)
Platelets: 141 10*3/uL — ABNORMAL LOW (ref 150–400)
RBC: 3.8 MIL/uL — ABNORMAL LOW (ref 3.87–5.11)
RDW: 13.5 % (ref 11.5–15.5)
WBC: 7.4 10*3/uL (ref 4.0–10.5)
nRBC: 0 % (ref 0.0–0.2)

## 2019-10-31 LAB — BASIC METABOLIC PANEL
Anion gap: 12 (ref 5–15)
BUN: 17 mg/dL (ref 8–23)
CO2: 20 mmol/L — ABNORMAL LOW (ref 22–32)
Calcium: 8.6 mg/dL — ABNORMAL LOW (ref 8.9–10.3)
Chloride: 114 mmol/L — ABNORMAL HIGH (ref 98–111)
Creatinine, Ser: 1.11 mg/dL — ABNORMAL HIGH (ref 0.44–1.00)
GFR calc Af Amer: 53 mL/min — ABNORMAL LOW (ref >60)
GFR calc non Af Amer: 46 mL/min — ABNORMAL LOW (ref >60)
Glucose, Bld: 164 mg/dL — ABNORMAL HIGH (ref 70–99)
Potassium: 3.8 mmol/L (ref 3.5–5.1)
Sodium: 146 mmol/L — ABNORMAL HIGH (ref 135–145)

## 2019-10-31 LAB — MAGNESIUM: Magnesium: 1.6 mg/dL — ABNORMAL LOW (ref 1.7–2.4)

## 2019-10-31 MED ORDER — CHLORHEXIDINE GLUCONATE CLOTH 2 % EX PADS
6.0000 | MEDICATED_PAD | Freq: Every day | CUTANEOUS | Status: DC
  Administered 2019-10-31: 6 via TOPICAL

## 2019-10-31 MED ORDER — POTASSIUM CHLORIDE ER 10 MEQ PO TBCR
10.0000 meq | EXTENDED_RELEASE_TABLET | Freq: Every day | ORAL | Status: DC | PRN
  Filled 2019-10-31: qty 1

## 2019-10-31 MED ORDER — FUROSEMIDE 20 MG PO TABS: 20.0000 mg | ORAL_TABLET | Freq: Every day | ORAL | Status: DC | PRN

## 2019-10-31 NOTE — Progress Notes (Signed)
Plan of care reviewed. Pt's progressing. Pt's alert and oriented x 4. Early ambulated to bedside commode, able to tolerated well with one assistance. She had limited movement on her right knee due to previous knee surgery. Pain tolerated well pain relieved withTylenol given PRN for pain. Able to detect PD and PT pulses via doppler bilaterally. Surgical wound on left neck and left groin appeared clean and dry, no hematoma, no bleeding.  Remained afebrile, vital signs stable.  Temp 97.7 F, HR 88-104, Atrial fib on monitor, Pt had PPM, but we saw v-paced occasionally.  BP  131/74- 145/81 mmHg RR 18-21, SPO2 91-96% on room air.  No incidents of immediate distress noted tonight. We will continue to monitor.  Kennyth Lose, RN

## 2019-10-31 NOTE — Progress Notes (Signed)
Echocardiogram 2D Echocardiogram has been performed.  Oneal Deputy Talin Feister 10/31/2019, 10:39 AM

## 2019-10-31 NOTE — Progress Notes (Addendum)
Pt walked in hall way around 120 feet with walker, tolerated well, no SOB, negative hematoma on left groin, minimal bruising on left carotid surgical site since the initial assessment. Pain tolerated well. Pain relieved byTylenol.   Foley cath was removed, no complication. Pt due to void within 6 hours at 01:00 pm. Continue to monitor.  Kennyth Lose, RN

## 2019-10-31 NOTE — Progress Notes (Signed)
CARDIAC REHAB PHASE I   PRE:  Rate/Rhythm: 89 paced  BP:  Sitting: 137/61      SaO2: 98 RA  MODE:  Ambulation: 200 ft   POST:  Rate/Rhythm: 92 SR  BP:  Sitting: 158/70    SaO2: 98 RA  Pt ambulated 272ft in hallway standby assist with front wheel walker. Pt states her breathing and energy is much improved since procedure. Pt denies pain, dizziness, or SOB. Pt and daughter educated on site care and restrictions. Encouraged continued ambulation with emphasis on safety.   [id number] Rufina Falco, RN BSN 10/31/2019 11:06 AM

## 2019-10-31 NOTE — Progress Notes (Signed)
Patient given discharge instructions medication list and follow up appointments given to patient, all questions answered.  IVa and tele were dcd. Will discharge home as ordered. Transported to exit via wheelchair and nursing staff. Nelda Bucks, Bettina Gavia RN

## 2019-10-31 NOTE — Anesthesia Postprocedure Evaluation (Signed)
Anesthesia Post Note  Patient: [name]  Procedure(s) Performed: TRANSCATHETER AORTIC VALVE REPLACEMENT, LEFT CAROTID (Left Neck) TRANSESOPHAGEAL ECHOCARDIOGRAM (TEE) (N/A )     Patient location during evaluation: SICU Anesthesia Type: General Level of consciousness: awake and patient cooperative Pain management: pain level controlled Vital Signs Assessment: post-procedure vital signs reviewed and stable Respiratory status: spontaneous breathing, nonlabored ventilation, respiratory function stable and patient connected to nasal cannula oxygen Cardiovascular status: blood pressure returned to baseline and stable Postop Assessment: no apparent nausea or vomiting Anesthetic complications: no    Last Vitals:  Vitals:   10/31/19 0517 10/31/19 0917  BP: (!) 141/82 128/68  Pulse: 76   Resp: 18 18  Temp:  36.4 C  SpO2: 95% 98%    Last Pain:  Vitals:   10/31/19 0917  TempSrc: Oral  PainSc: 0-No pain                 Seward Coran

## 2019-10-31 NOTE — Discharge Summary (Signed)
[facility] VALVE TEAM  Discharge Summary    Patient ID: [name] MRN: [id number]; DOB: [birth year]  Admit date: 10/30/2019 Discharge date: 10/31/2019  Primary Care Provider: [name], MD  Primary Cardiologist: [name], MD / Dr. Angelena Form & Dr. Roxy Manns (TAVR)  Discharge Diagnoses    Principal Problem:   S/P TAVR (transcatheter aortic valve replacement) Active Problems:   Malignant neoplasm of kidney excluding renal pelvis (HCC)   Hypothyroidism   Atrial fibrillation ([place]) Chadsvasc 5   S/P repair of patent ductus arteriosus   Severe aortic stenosis   Cardiac pacemaker    Hyperlipidemia, mixed   Essential hypertension   CKD (chronic kidney disease), stage III   Refusal of blood transfusions as patient is Jehovah's Witness   Iron deficiency anemia due to chronic blood loss   Gastroesophageal reflux disease with esophagitis   Post gastrectomy syndrome   S/P partial gastrectomy   Long term current use of anticoagulant therapy   Acute on chronic diastolic heart failure (HCC)   Allergies Allergies  Allergen Reactions  . Amiodarone Other (See Comments)     Thyroid and liver and kidney problems  . Adhesive [Tape] Other (See Comments)    Tears skin, Please use "paper" tape  . Hydrocodone Other (See Comments)    HALLLUCINATIONS  . Other Other (See Comments)    NO BLOOD PRODUCTS -  Jehoval Witness  . Reglan [Metoclopramide] Hives, Itching and Rash  . Remeron [Mirtazapine] Other (See Comments)    Hallucinations and make pt loopy  . Codeine Nausea And Vomiting  . Morphine And Related Nausea And Vomiting  . Requip [Ropinirole Hcl] Other (See Comments)    Headache   . Zinc Nausea Only    Diagnostic Studies/Procedures     TAVR OPERATIVE NOTE   Date of Procedure:                10/30/2019  Preoperative Diagnosis:      Severe Aortic Stenosis   Postoperative Diagnosis:    Same   Procedure:         Transcatheter Aortic Valve Replacement - Open Left Transcarotid Approach             Edwards Sapien 3 THV (size 23 mm, model # 9600TFX, serial # [id number])              Co-Surgeons:                        Valentina Gu. Roxy Manns, MD and [name], MD  Anesthesiologist:                  Laurie Panda, MD  Echocardiographer:              Ena Dawley, MD  Pre-operative Echo Findings: ? Paradoxical Low Flow with Normal EF Severe aortic stenosis ? Normal left ventricular systolic function  Post-operative Echo Findings: ? Mild paravalvular leak ? Normal left ventricular systolic function  _____________   Echo 10/31/19: complete but pending formal read at the time of discharge    History of Present Illness    [name] is a 86 y.o. female with a history of chronic diastolic CHF, Hx of ocular stroke, persistent afib on coumadin, tachy-brady syndrome s/p PPM, HTN, HLD, gastric cancer,renal cell carcinoma s/p nephrectomy, radiation proctitis,CKD stage III,anemia, recent bladder cancer, Jehovah's witness faith,and LFLG severe aortic stenosis who presented to [facility] on 10/30/19 for planned TAVR.  Patient has a longstanding history of chronic diastolic congestive heart failure and atrial fibrillation.  She was followed for many years by Dr. Mare Ferrari and more recently by Dr. [name].  She underwent permanent pacemaker implantation in the distant past for tachybradycardia syndrome and has been on long-term warfarin anticoagulation for many years.  She has developed progressive exertional shortness of breath, fatigue, and fluid retention over the past 2 years.  She has required escalating doses of diuretic therapy and she has been staying in persistent atrial fibrillation. Follow-up transthoracic echocardiogram performed September 17, 2019 findings consistent with paradoxical low flow low gradient severe aortic stenosis. Ejection fraction was estimated 60 to 65%. The aortic valve was  severely calcified with severely restricted leaflet motion. Peak velocity across aortic valve measured 3.3 m/s corresponding to mean transvalvular gradient estimated 25 mmHg. The DVI was notably quite low at 0.21 with aortic valve area calculated 0.60 cm and stroke-volume only 43 mL with stroke-volume index 31 mL/m.  Diagnostic cardiac catheterization performed by Dr. [name] on October 09, 2019 revealed nonobstructive coronary artery disease.  Peak to peak and mean transvalvular gradients across aortic valve were measured 19 and 11.4 mmHg respectively corresponding to aortic valve area calculated only 0.84 cm.  The patient has been evaluated by the multidisciplinary valve team and felt to have severe, symptomatic aortic stenosis and to be a suitable candidate for TAVR, which was set up for 10/30/19.    Hospital Course     Consultants: none   Severe AS: s/p successful TAVR with a 23 mm Edwards Sapien 3 Ultra THV via the left carotid approach on 10/30/19. Post operative echo completed but pending formal read. I personally reviewed echo and she appears to have at most moderate PVL. Will await formal read. Groin site and carotid sites are stable. ECG with afib and no high grade heart block (has pacemaker) Continue Coumadin alone given history of anemia, gastric cancer and radiation proctitis. Patient breathing much easier.   Persistent atrial fibrillaiton: continue on BB and CCB for rate control. Will resume home coumadin tonight without bridge. I have discussed with out coumadin clinic who will help arrange home health INR checks early next week.   Acute on chronic diastolic CHF: as evidenced by an elevated BNP on pre admission lab work and mild CHF on CXR. This has been treated with TAVR. Will resume home lasix today.  HTN: BP elevated immediately post op requiring a nitro gtt. Now better controlled with resumption of home medications.   CKD stage III: creat stable ~ 1.1.   TAA: pre TAVR CT showed  stable ectatic 4.0 cm ascending thoracic aorta. Recommend annual imaging followup by CTA or MRA _____________  Discharge Vitals Blood pressure 128/68, pulse 76, temperature 97.6 F (36.4 C), temperature source Oral, resp. rate 18, height 4\' 11"  (1.499 m), weight 48.1 kg, SpO2 98 %.  Filed Weights   10/30/19 0907 10/31/19 0500  Weight: 45 kg 48.1 kg    GEN: Well nourished, well developed, in no acute distress HEENT: normal Neck: no JVD or masses Cardiac: irreg irreg; 2/6 diastolic murmur. No rubs, or gallops. 1 + bilateral LE edema.  Respiratory: clear to auscultation bilaterally, normal work of breathing GI: soft, nontender, nondistended, + BS MS: no deformity or atrophy Skin: warm and dry, no rash. Carotid site and groin site without hematoma or ecchymosis.  Neuro:  Alert and Oriented x 3, Strength and sensation are intact Psych: euthymic mood, full affect   Labs & Radiologic Studies  CBC Recent Labs    10/30/19 1504 10/31/19 0328  WBC  --  7.4  HGB 12.9 12.4  HCT 38.0 37.1  MCV  --  97.6  PLT  --  [id number]*   Basic Metabolic Panel Recent Labs    10/30/19 1504 10/31/19 0328  NA 144 146*  K 3.8 3.8  CL 112* 114*  CO2  --  20*  GLUCOSE 136* 164*  BUN 16 17  CREATININE 0.70 1.11*  CALCIUM  --  8.6*  MG  --  1.6*   Liver Function Tests No results for input(s): AST, ALT, ALKPHOS, BILITOT, PROT, ALBUMIN in the last 72 hours. No results for input(s): LIPASE, AMYLASE in the last 72 hours. Cardiac Enzymes No results for input(s): CKTOTAL, CKMB, CKMBINDEX, TROPONINI in the last 72 hours. BNP Invalid input(s): POCBNP D-Dimer No results for input(s): DDIMER in the last 72 hours. Hemoglobin A1C No results for input(s): HGBA1C in the last 72 hours. Fasting Lipid Panel No results for input(s): CHOL, HDL, LDLCALC, TRIG, CHOLHDL, LDLDIRECT in the last 72 hours. Thyroid Function Tests No results for input(s): TSH, T4TOTAL, T3FREE, THYROIDAB in the last 72  hours.  Invalid input(s): FREET3 _____________  DG Chest 2 View  Result Date: 10/26/2019 CLINICAL DATA:  86 year old female with shortness of breath. EXAM: CHEST - 2 VIEW COMPARISON:  Chest radiograph dated 02/27/2019. FINDINGS: There is emphysematous changes of the lungs with flattening of the diaphragms. Probable small bilateral pleural effusions similar to prior radiograph. No focal consolidation or pneumothorax. The cardiac silhouette is within normal limits. Atherosclerotic calcification of the aorta. Right pectoral pacemaker device. No acute osseous pathology. IMPRESSION: 1. Emphysema.  No focal consolidation or pneumothorax. 2. Probable small bilateral pleural effusions similar to prior radiograph. Electronically Signed   By: Anner Crete M.D.   On: 10/26/2019 20:54   CARDIAC CATHETERIZATION  Result Date: 10/09/2019  Ost LAD to Prox LAD lesion is 60% stenosed.  1st Sept lesion is 90% stenosed.  1st Diag lesion is 60% stenosed.  Ost RCA to Prox RCA lesion is 50% stenosed.  LV end diastolic pressure is normal.  There is severe aortic valve stenosis.  Hemodynamic findings consistent with aortic valve stenosis.  1. Moderate nonobstructive CAD except in a small septal perforator branch 2. Severe low flow/low gradient aortic stenosis. AVA 0.84 with index 0.61.  Mean gradient 11 mmHg 3. Normal right heart pressures 4. Reduced cardiac output with index 2.11. 5. Normal LV filling pressures. Plan: proceed with evaluation for TAVR.   CT CORONARY MORPH W/CTA COR W/SCORE W/CA W/CM &/OR WO/CM  Addendum Date: 10/17/2019   ADDENDUM REPORT: 10/17/2019 01:35 CLINICAL DATA:  Aortic stenosis EXAM: Cardiac TAVR CT TECHNIQUE: The patient was scanned on a Siemens Force [id number] slice scanner. A 120 kV retrospective scan was triggered in the descending thoracic aorta at 111 HU's. Gantry rotation speed was 270 msecs and collimation was .9 mm. No beta blockade or nitro were given. The 3D data set was reconstructed  in 5% intervals of the R-R cycle. Systolic and diastolic phases were analyzed on a dedicated work station using MPR, MIP and VRT modes. The patient received 39mL OMNIPAQUE IOHEXOL 350 MG/ML SOLN of contrast. FINDINGS: Aortic Valve: Trileaflet aortic valve. Severely reduced cusp separation. Severely thickened, severely calcified aortic valve cusps. AV calcium score: 2626 Virtual Basal Annulus Measurements: Maximum/Minimum Diameter: 29 x 19.4 mm Perimeter: 78.4 mm Area: 448 mm2 LVOT calcifications along the aorto-mitral continuity. Based on these measurements, the annulus would be suitable  for a 26 mm Sapien 3 valve. Sinus of Valsalva Measurements: Non-coronary: 34 mm Right - coronary: 35 mm Left - coronary: 36 mm Sinus of Valsalva Height: Left: 19.1 mm Right: 20 mm Aorta: Severe mixed atherosclerotic plaque in the aortic arch, left subclavian artery, brachiocephalic artery ostium, and descending thoracic aorta. Sinotubular Junction: 31 mm Ascending Thoracic Aorta: 40 mm Aortic Arch: 30 mm Descending Thoracic Aorta: 23 mm Coronary Artery Height above Annulus: Left Main: 16.4 mm to the LAD, 16.8 mm to the L circumflex artery Right Coronary: 17.8 mm Coronary Arteries: Separate ostia of the LAD and L circumflex artery. Optimum Fluoroscopic Angle for Delivery: LAO 5, CAU 5 Filling defect in the left atrial appendage, cannot exclude left atrial appendage thrombus. No delay sequence for comparison. IMPRESSION: 1. Trileaflet aortic valve. Severely reduced cusp separation. Severely thickened, severely calcified aortic valve cusps. 2.  AV calcium score: 2626 3. Annulus area 448 mm2. The annulus may be suitable for a 26 mm Sapien 3 valve. 4.  Mild ascending aorta dilation, 40 mm at mid ascending aorta. 5. Severe mixed atherosclerotic plaque in the aortic arch and descending thoracic aorta, as well as the left subclavian artery. 6. Adequate coronary artery heights. Separate ostia of the left circumflex artery and LAD. 7. Optimum  Fluoroscopic Angle for Delivery: LAO 5, CAU 5 8. Contrast filling defect in the left atrial appendage, cannot exclude left atrial appendage thrombus. Electronically Signed   By: Cherlynn Kaiser   On: 10/17/2019 01:35   Result Date: 10/17/2019 EXAM: OVER-READ INTERPRETATION  CT CHEST The following report is an over-read performed by radiologist Dr. Salvatore Marvel of [facility], [place] on 10/16/2019. This over-read does not include interpretation of cardiac or coronary anatomy or pathology. The coronary CTA interpretation by the cardiologist is attached. COMPARISON:  03/21/2018 chest CT. FINDINGS: Please see the separate concurrent chest CT angiogram report for details. IMPRESSION: Please see the separate concurrent chest CT angiogram report for details. Electronically Signed: By: Ilona Sorrel M.D. On: 10/16/2019 11:51   DG Chest Port 1 View  Result Date: 10/30/2019 CLINICAL DATA:  86 year old female status post TAVR. EXAM: PORTABLE CHEST 1 VIEW COMPARISON:  Chest radiograph dated 10/26/2019. FINDINGS: There is mild cardiomegaly and mild vascular congestion, new or increased since the prior radiograph. Trace bilateral pleural effusions may be present. No focal consolidation or pneumothorax. Aortic valve repair and right pectoral dual lead pacemaker device. Atherosclerotic calcification of the aorta. No acute osseous pathology. IMPRESSION: 1. Mild cardiomegaly and mild vascular congestion. No focal consolidation. 2. Aortic valve repair and Aortic Atherosclerosis (ICD10-I70.0). Electronically Signed   By: Anner Crete M.D.   On: 10/30/2019 17:36   CT ANGIO CHEST AORTA W/CM & OR WO/CM  Result Date: 10/16/2019 CLINICAL DATA:  Severe symptomatic aortic stenosis. Pre-TAVR evaluation. History of partial gastrectomy for gastric cancer, hysterectomy for uterine cancer and left nephrectomy for renal cell carcinoma. EXAM: CT ANGIOGRAPHY CHEST, ABDOMEN AND PELVIS TECHNIQUE: Multidetector CT imaging through the  chest, abdomen and pelvis was performed using the standard protocol during bolus administration of intravenous contrast. Multiplanar reconstructed images and MIPs were obtained and reviewed to evaluate the vascular anatomy. CONTRAST:  15mL OMNIPAQUE IOHEXOL 350 MG/ML SOLN COMPARISON:  03/21/2018 CT chest, abdomen and pelvis. FINDINGS: CTA CHEST FINDINGS Cardiovascular: Mild cardiomegaly. No significant pericardial effusion/thickening. Diffuse thickening and coarse calcification of the aortic valve. Two lead right subclavian pacemaker with lead tips in the right atrium and right ventricle. Atherosclerotic thoracic aorta with ectatic 4.0 cm ascending  thoracic aorta, stable. Normal caliber main pulmonary artery. No central pulmonary emboli. Mediastinum/Nodes: No discrete thyroid nodules. Unremarkable esophagus. No axillary adenopathy. Mildly enlarged 1.0 cm right paratracheal node (series 5/image 39) and mildly enlarged 1.1 cm subcarinal node (series 5/image 46), not appreciably changed, most compatible with benign reactive etiology. No new pathologically enlarged mediastinal nodes. No pathologically enlarged hilar nodes. Lungs/Pleura: No pneumothorax. Small dependent bilateral pleural effusions. Mild passive atelectasis in the dependent lower lobes. No acute consolidative airspace disease, lung masses or significant pulmonary nodules. Stable tiny calcified medial right upper lobe granuloma. Musculoskeletal: No aggressive appearing focal osseous lesions. Marked thoracic spondylosis. CTA ABDOMEN AND PELVIS FINDINGS Hepatobiliary: Normal liver with no liver mass. Cholecystectomy. No significant intrahepatic biliary ductal dilatation. Mildly dilated common bile duct (10 mm diameter), stable, compatible with chronic post cholecystectomy effect. Pancreas: Normal, with no mass or duct dilation. Spleen: Normal size. No mass. Adrenals/Urinary Tract: Normal adrenals. Status post left nephrectomy. No mass or fluid collection in  the left nephrectomy bed. No right hydronephrosis. No contour deforming right renal masses. Bladder obscured by streak artifact from right hip hardware, with no gross bladder abnormality. Stomach/Bowel: Small hiatal hernia involving the remnant stomach. Stable postsurgical changes from partial distal gastrectomy with intact appearing gastrojejunostomy and left abdominal enteroenterostomy. Normal caliber small bowel with no small bowel wall thickening. Appendix not discretely visualized. Normal large bowel with no diverticulosis, large bowel wall thickening or pericolonic fat stranding. Vascular/Lymphatic: Atherosclerotic nonaneurysmal abdominal aorta. No pathologically enlarged lymph nodes in the abdomen or pelvis. Reproductive: Status post hysterectomy, with no abnormal findings at the vaginal cuff. No adnexal mass. Other: No pneumoperitoneum. Trace free fluid in the pelvic cul-de-sac, similar to prior CT. No focal fluid collections. Musculoskeletal: No aggressive appearing focal osseous lesions. Right total hip arthroplasty. Mild lumbar spondylosis. VASCULAR MEASUREMENTS PERTINENT TO TAVR: AORTA: Minimal Aortic Diameter-11.7 x 11.0 mm Severity of Aortic Calcification-severe RIGHT PELVIS: Right Common Iliac Artery - Minimal Diameter-6.4 x 5.6 mm Tortuosity-mild-to-moderate Calcification-severe Right External Iliac Artery - Minimal Diameter-3.3 x 1.7 mm Tortuosity-mild-to-moderate Calcification-severe Right Common Femoral Artery - Minimal Diameter-6.1 x 5.7 mm Tortuosity-mild Calcification-moderate LEFT PELVIS: Left Common Iliac Artery - Minimal Diameter-7.8 x 6.8 mm Tortuosity-moderate Calcification-moderate Left External Iliac Artery - Minimal Diameter-4.9 x 4.6 mm Tortuosity-moderate Calcification-severe Left Common Femoral Artery - Minimal Diameter-7.0 x 6.4 mm Tortuosity-mild-to-moderate Calcification-mild Review of the MIP images confirms the above findings. IMPRESSION: 1. Vascular findings and measurements  pertinent to potential TAVR procedure, as detailed. Note made of severe aortic and iliofemoral atherosclerosis with multifocal high-grade stenoses in the right external iliac artery. 2. Diffuse thickening and coarse calcification of the aortic valve, compatible with the provided history of severe symptomatic aortic stenosis. 3. Small dependent bilateral pleural effusions. 4. Mild cardiomegaly. 5. Stable ectatic 4.0 cm ascending thoracic aorta. Recommend annual imaging followup by CTA or MRA. This recommendation follows 2010 ACCF/AHA/AATS/ACR/ASA/SCA/SCAI/SIR/STS/SVM Guidelines for the Diagnosis and Management of Patients with Thoracic Aortic Disease. Circulation. 2010; [id number]. Aortic aneurysm NOS (ICD10-I71.9). 6. Stable extensive postsurgical changes from prior cholecystectomy, hysterectomy, distal gastrectomy and left nephrectomy. 7. Aortic Atherosclerosis (ICD10-I70.0). Electronically Signed   By: Ilona Sorrel M.D.   On: 10/16/2019 14:08   ECHO TEE  Result Date: 10/30/2019    TRANSESOPHOGEAL ECHO REPORT   Patient Name:   Kamillia Self Date of Exam: 10/30/2019 Medical Rec #:  [id number]      Height:       59.0 in Accession #:    [id number]     Weight:  101.4 lb Date of Birth:  1933-11-26       BSA:          1.382 m Patient Age:    71 years       BP:           191/96 mmHg Patient Gender: F              HR:           84 bpm. Exam Location:  Inpatient Procedure: Transesophageal Echo, Color Doppler and Cardiac Doppler Indications:     Aortic stenosis 424.1 / I35.0  History:         Patient has prior history of Echocardiogram examinations, most                  recent 09/16/2010. CAD, Pacemaker, Arrythmias:Atrial                  Fibrillation; Risk Factors:Hypertension and Dyslipidemia.                  Tachy-brady syndrome. Chronic kidney disease. GERD.                  Aortic Valve: 23 mm Edwards Sapien prosthetic, stented (TAVR)                  valve is present in the aortic position. Procedure Date:                   10/30/2019.  Sonographer:     Darlina Sicilian RDCS Referring Phys:  [place] Diagnosing Phys: Ena Dawley MD PROCEDURE: The transesophogeal probe was passed without difficulty through the esophogus of the patient. Sedation performed by performing physician. The patient's vital signs; including heart rate, blood pressure, and oxygen saturation; remained stable throughout the procedure. The patient developed no complications during the procedure. IMPRESSIONS  1. A 23 mm Edwards-SAPIEN 3 valve was successfully placed in the aortic position. Post deployment gradients were peak/mean 3/1 mmHg. Mild paravalvular leak in the area with severe assymetric calcifications in the LVOT (between left and non-coronary cusp).  2. TAVR procedure, carotid access. Hyperdynamic LVEF, severe concentric LVH. Peak/mean transaortic gradients 47/22 mmHg. Paradoxical low flow severe aortic stenosis. Moderate aortic regurgitation.  3. Left ventricular ejection fraction, by estimation, is >75%. The left ventricle has hyperdynamic function. The left ventricle has no regional wall motion abnormalities. There is severe concentric left ventricular hypertrophy. Left ventricular diastolic function could not be evaluated. Left ventricular diastolic function could not be evaluated.  4. Right ventricular systolic function is normal. The right ventricular size is normal.  5. No left atrial/left atrial appendage thrombus was detected.  6. The mitral valve is degenerative. Moderate mitral valve regurgitation. No evidence of mitral stenosis.  7. The aortic valve is normal in structure and function. Aortic valve regurgitation is moderate. Severe aortic valve stenosis. There is a 23 mm Edwards Sapien prosthetic (TAVR) valve present in the aortic position. Procedure Date: 10/30/2019. Aortic valve mean gradient measures 22.0 mmHg.  8. The inferior vena cava is normal in size with greater than 50% respiratory variability,  suggesting right atrial pressure of 3 mmHg. Conclusion(s)/Recommendation(s): Normal biventricular function without evidence of hemodynamically significant valvular heart disease. FINDINGS  Left Ventricle: Left ventricular ejection fraction, by estimation, is >75%. The left ventricle has hyperdynamic function. The left ventricle has no regional wall motion abnormalities. The left ventricular internal cavity size was small. There is severe concentric left ventricular hypertrophy.  Left ventricular diastolic function could not be evaluated. Right Ventricle: The right ventricular size is normal. No increase in right ventricular wall thickness. Right ventricular systolic function is normal. Left Atrium: There is heavy smoke in the left atrial appendage. Left atrial size was not assessed. No left atrial/left atrial appendage thrombus was detected. Right Atrium: Right atrial size was not assessed. Pericardium: There is no evidence of pericardial effusion. Mitral Valve: The mitral valve is degenerative in appearance. Normal mobility of the mitral valve leaflets. Moderate mitral valve regurgitation. No evidence of mitral valve stenosis. Tricuspid Valve: The tricuspid valve is normal in structure. Tricuspid valve regurgitation is mild . No evidence of tricuspid stenosis. Aortic Valve: The aortic valve is normal in structure and function.. There is severe thickening and severe calcifcation of the aortic valve. Aortic valve regurgitation is moderate. Severe aortic stenosis is present. Severe aortic valve annular calcification. There is severe thickening of the aortic valve. There is severe calcifcation of the aortic valve. Aortic valve mean gradient measures 22.0 mmHg. Aortic valve peak gradient measures 2.9 mmHg. Aortic valve area, by VTI measures 2.69 cm. There is a 23 mm Edwards Sapien prosthetic, stented (TAVR) valve present in the aortic position. Procedure Date: 10/30/2019. Pulmonic Valve: The pulmonic valve was normal in  structure. Pulmonic valve regurgitation is mild. No evidence of pulmonic stenosis. Aorta: The aortic root is normal in size and structure. Venous: The inferior vena cava is normal in size with greater than 50% respiratory variability, suggesting right atrial pressure of 3 mmHg. IAS/Shunts: No atrial level shunt detected by color flow Doppler. Additional Comments: A pacer wire is visualized.  LEFT VENTRICLE PLAX 2D LVOT diam:     2.10 cm LV SV:         54 LV SV Index:   39 LVOT Area:     3.46 cm  AORTIC VALVE AV Area (Vmax):    3.16 cm AV Area (Vmean):   2.83 cm AV Area (VTI):     2.69 cm AV Vmax:           84.50 cm/s AV Vmean:          54.800 cm/s AV VTI:            0.202 m AV Peak Grad:      2.9 mmHg AV Mean Grad:      22.0 mmHg LVOT Vmax:         77.10 cm/s LVOT Vmean:        44.800 cm/s LVOT VTI:          0.157 m LVOT/AV VTI ratio: 0.78  SHUNTS Systemic VTI:  0.16 m Systemic Diam: 2.10 cm Ena Dawley MD Electronically signed by Ena Dawley MD Signature Date/Time: 10/30/2019/3:50:48 PM    Final    Structural Heart Procedure  Result Date: 10/30/2019 See surgical note for result.  CT Angio Abd/Pel w/ and/or w/o  Result Date: 10/16/2019 CLINICAL DATA:  Severe symptomatic aortic stenosis. Pre-TAVR evaluation. History of partial gastrectomy for gastric cancer, hysterectomy for uterine cancer and left nephrectomy for renal cell carcinoma. EXAM: CT ANGIOGRAPHY CHEST, ABDOMEN AND PELVIS TECHNIQUE: Multidetector CT imaging through the chest, abdomen and pelvis was performed using the standard protocol during bolus administration of intravenous contrast. Multiplanar reconstructed images and MIPs were obtained and reviewed to evaluate the vascular anatomy. CONTRAST:  42mL OMNIPAQUE IOHEXOL 350 MG/ML SOLN COMPARISON:  03/21/2018 CT chest, abdomen and pelvis. FINDINGS: CTA CHEST FINDINGS Cardiovascular: Mild cardiomegaly. No significant pericardial effusion/thickening. Diffuse thickening and  coarse  calcification of the aortic valve. Two lead right subclavian pacemaker with lead tips in the right atrium and right ventricle. Atherosclerotic thoracic aorta with ectatic 4.0 cm ascending thoracic aorta, stable. Normal caliber main pulmonary artery. No central pulmonary emboli. Mediastinum/Nodes: No discrete thyroid nodules. Unremarkable esophagus. No axillary adenopathy. Mildly enlarged 1.0 cm right paratracheal node (series 5/image 39) and mildly enlarged 1.1 cm subcarinal node (series 5/image 46), not appreciably changed, most compatible with benign reactive etiology. No new pathologically enlarged mediastinal nodes. No pathologically enlarged hilar nodes. Lungs/Pleura: No pneumothorax. Small dependent bilateral pleural effusions. Mild passive atelectasis in the dependent lower lobes. No acute consolidative airspace disease, lung masses or significant pulmonary nodules. Stable tiny calcified medial right upper lobe granuloma. Musculoskeletal: No aggressive appearing focal osseous lesions. Marked thoracic spondylosis. CTA ABDOMEN AND PELVIS FINDINGS Hepatobiliary: Normal liver with no liver mass. Cholecystectomy. No significant intrahepatic biliary ductal dilatation. Mildly dilated common bile duct (10 mm diameter), stable, compatible with chronic post cholecystectomy effect. Pancreas: Normal, with no mass or duct dilation. Spleen: Normal size. No mass. Adrenals/Urinary Tract: Normal adrenals. Status post left nephrectomy. No mass or fluid collection in the left nephrectomy bed. No right hydronephrosis. No contour deforming right renal masses. Bladder obscured by streak artifact from right hip hardware, with no gross bladder abnormality. Stomach/Bowel: Small hiatal hernia involving the remnant stomach. Stable postsurgical changes from partial distal gastrectomy with intact appearing gastrojejunostomy and left abdominal enteroenterostomy. Normal caliber small bowel with no small bowel wall thickening. Appendix not  discretely visualized. Normal large bowel with no diverticulosis, large bowel wall thickening or pericolonic fat stranding. Vascular/Lymphatic: Atherosclerotic nonaneurysmal abdominal aorta. No pathologically enlarged lymph nodes in the abdomen or pelvis. Reproductive: Status post hysterectomy, with no abnormal findings at the vaginal cuff. No adnexal mass. Other: No pneumoperitoneum. Trace free fluid in the pelvic cul-de-sac, similar to prior CT. No focal fluid collections. Musculoskeletal: No aggressive appearing focal osseous lesions. Right total hip arthroplasty. Mild lumbar spondylosis. VASCULAR MEASUREMENTS PERTINENT TO TAVR: AORTA: Minimal Aortic Diameter-11.7 x 11.0 mm Severity of Aortic Calcification-severe RIGHT PELVIS: Right Common Iliac Artery - Minimal Diameter-6.4 x 5.6 mm Tortuosity-mild-to-moderate Calcification-severe Right External Iliac Artery - Minimal Diameter-3.3 x 1.7 mm Tortuosity-mild-to-moderate Calcification-severe Right Common Femoral Artery - Minimal Diameter-6.1 x 5.7 mm Tortuosity-mild Calcification-moderate LEFT PELVIS: Left Common Iliac Artery - Minimal Diameter-7.8 x 6.8 mm Tortuosity-moderate Calcification-moderate Left External Iliac Artery - Minimal Diameter-4.9 x 4.6 mm Tortuosity-moderate Calcification-severe Left Common Femoral Artery - Minimal Diameter-7.0 x 6.4 mm Tortuosity-mild-to-moderate Calcification-mild Review of the MIP images confirms the above findings. IMPRESSION: 1. Vascular findings and measurements pertinent to potential TAVR procedure, as detailed. Note made of severe aortic and iliofemoral atherosclerosis with multifocal high-grade stenoses in the right external iliac artery. 2. Diffuse thickening and coarse calcification of the aortic valve, compatible with the provided history of severe symptomatic aortic stenosis. 3. Small dependent bilateral pleural effusions. 4. Mild cardiomegaly. 5. Stable ectatic 4.0 cm ascending thoracic aorta. Recommend annual  imaging followup by CTA or MRA. This recommendation follows 2010 ACCF/AHA/AATS/ACR/ASA/SCA/SCAI/SIR/STS/SVM Guidelines for the Diagnosis and Management of Patients with Thoracic Aortic Disease. Circulation. 2010; [id number]. Aortic aneurysm NOS (ICD10-I71.9). 6. Stable extensive postsurgical changes from prior cholecystectomy, hysterectomy, distal gastrectomy and left nephrectomy. 7. Aortic Atherosclerosis (ICD10-I70.0). Electronically Signed   By: Ilona Sorrel M.D.   On: 10/16/2019 14:08   Disposition   Pt is being discharged home today in good condition.  Follow-up Plans & Appointments    Follow-up Information  Eileen Stanford, PA-C. Go on 11/08/2019.   Specialties: Cardiology, Radiology Why: @ 3 pm, please arrive at least 10 minutes early.  Contact information: [address] [place] [address] [phone]        Home Health INR check Follow up on 11/05/2019.   Why: Our pharmacy team will coordinate a home health INR check and give you instructions on your coumadin.            Discharge Medications   Allergies as of 10/31/2019      Reactions   Amiodarone Other (See Comments)    Thyroid and liver and kidney problems   Adhesive [tape] Other (See Comments)   Tears skin, Please use "paper" tape   Hydrocodone Other (See Comments)   HALLLUCINATIONS   Other Other (See Comments)   NO BLOOD PRODUCTS -  Jehoval Witness   Reglan [metoclopramide] Hives, Itching, Rash   Remeron [mirtazapine] Other (See Comments)   Hallucinations and make pt loopy   Codeine Nausea And Vomiting   Morphine And Related Nausea And Vomiting   Requip [ropinirole Hcl] Other (See Comments)   Headache   Zinc Nausea Only      Medication List    STOP taking these medications   enoxaparin 60 MG/0.6ML injection Commonly known as: LOVENOX     TAKE these medications   acetaminophen 500 MG tablet Commonly known as: TYLENOL Take 500-1,000 mg by mouth every 6 (six) hours as  needed for mild pain or headache.   ALPRAZolam 0.5 MG tablet Commonly known as: XANAX Take 0.5 mg by mouth 2 (two) times daily as needed for anxiety.   Biotin 10000 MCG Tbdp Take 10,000 mcg by mouth daily.   erythromycin ophthalmic ointment Place 1 application into both eyes at bedtime as needed (stye).   famotidine 40 MG tablet Commonly known as: PEPCID Take 1 tablet Daily for Indigestion & Heartburn What changed:   how much to take  how to take this  when to take this  additional instructions   ferrous sulfate 325 (65 FE) MG tablet Take 325 mg by mouth daily in the afternoon.   furosemide 20 MG tablet Commonly known as: LASIX Take 20 mg by mouth daily as needed for fluid.   gabapentin 300 MG capsule Commonly known as: NEURONTIN TAKE 1 CAPSULE BY MOUTH THREE TIMES A DAY What changed: See the new instructions.   levothyroxine 100 MCG tablet Commonly known as: SYNTHROID Take 1/2 tablet daily for thyroid. What changed:   how much to take  how to take this  when to take this  additional instructions   loperamide 2 MG tablet Commonly known as: IMODIUM A-D Take up to 12 tablets /day as needed for  for Diarrhea   loratadine 10 MG tablet Commonly known as: CLARITIN Take 10 mg by mouth at bedtime.   losartan 25 MG tablet Commonly known as: COZAAR Take 2 tablets (50 mg total) by mouth daily. What changed: when to take this   metoprolol tartrate 100 MG tablet Commonly known as: LOPRESSOR Take 1 tablet 2 x /day for BP What changed:   how much to take  how to take this  when to take this  additional instructions   MYLANTA PO Take 20 mLs by mouth every evening.   nitroGLYCERIN 0.4 MG SL tablet Commonly known as: NITROSTAT Dissolve 1 tablet under tongue every 5 minutes if needed for Angina What changed:   how much to take  how to take  this  when to take this  reasons to take this  additional instructions   ondansetron 8 MG disintegrating  tablet Commonly known as: ZOFRAN-ODT Take 8 mg by mouth every 8 (eight) hours as needed for nausea or vomiting.   potassium chloride 10 MEQ tablet Commonly known as: KLOR-CON Take 1 tablet (10 mEq total) by mouth daily. What changed:   when to take this  reasons to take this   simethicone 80 MG chewable tablet Commonly known as: MYLICON Chew 80 mg by mouth every 6 (six) hours as needed for flatulence.   sucralfate 1 g tablet Commonly known as: CARAFATE TAKE 1 TABLET (1 G TOTAL) BY MOUTH 4 (FOUR) TIMES DAILY - WITH MEALS AND AT BEDTIME. What changed: See the new instructions.   Systane Complete 0.6 % Soln Generic drug: Propylene Glycol Place 1 drop into both eyes 3 (three) times daily as needed (irritation/dry eyes.).   tobramycin-dexamethasone ophthalmic solution Commonly known as: TOBRADEX Place 1 drop into both eyes daily as needed (stye).   triamcinolone cream 0.1 % Commonly known as: KENALOG Apply 1 application topically 2 (two) times daily as needed (rash/skin irritation.).   verapamil 120 MG CR tablet Commonly known as: CALAN-SR Take 1 tablet 2 x /day with a meal for BP & Heart Rhythm What changed:   how much to take  how to take this  when to take this  additional instructions   vitamin C 1000 MG tablet Take 1,000 mg by mouth daily.   Vitamin D-3 125 MCG (5000 UT) Tabs Take 5,000 Units by mouth 3 (three) times daily.   warfarin 2 MG tablet Commonly known as: COUMADIN Take as directed. If you are unsure how to take this medication, talk to your nurse or doctor. Original instructions: TAKE 1 TO 2 TABLETS AS DIRECTED TO PREVENT BLOOD CLOTS What changed: See the new instructions.         Outstanding Labs/Studies   INR  Duration of Discharge Encounter   Greater than 30 minutes including physician time.  SignedAngelena Form, PA-C 10/31/2019, 11:53 AM [phone]

## 2019-11-01 ENCOUNTER — Telehealth: Payer: Self-pay | Admitting: Pharmacist

## 2019-11-01 ENCOUNTER — Telehealth: Payer: Self-pay | Admitting: Physician Assistant

## 2019-11-01 DIAGNOSIS — K3184 Gastroparesis: Secondary | ICD-10-CM | POA: Diagnosis not present

## 2019-11-01 DIAGNOSIS — I083 Combined rheumatic disorders of mitral, aortic and tricuspid valves: Secondary | ICD-10-CM | POA: Diagnosis not present

## 2019-11-01 DIAGNOSIS — J301 Allergic rhinitis due to pollen: Secondary | ICD-10-CM | POA: Diagnosis not present

## 2019-11-01 DIAGNOSIS — Z85028 Personal history of other malignant neoplasm of stomach: Secondary | ICD-10-CM | POA: Diagnosis not present

## 2019-11-01 DIAGNOSIS — N183 Chronic kidney disease, stage 3 unspecified: Secondary | ICD-10-CM | POA: Diagnosis not present

## 2019-11-01 DIAGNOSIS — E039 Hypothyroidism, unspecified: Secondary | ICD-10-CM | POA: Diagnosis not present

## 2019-11-01 DIAGNOSIS — Z85038 Personal history of other malignant neoplasm of large intestine: Secondary | ICD-10-CM | POA: Diagnosis not present

## 2019-11-01 DIAGNOSIS — I130 Hypertensive heart and chronic kidney disease with heart failure and stage 1 through stage 4 chronic kidney disease, or unspecified chronic kidney disease: Secondary | ICD-10-CM | POA: Diagnosis not present

## 2019-11-01 DIAGNOSIS — I5033 Acute on chronic diastolic (congestive) heart failure: Secondary | ICD-10-CM | POA: Diagnosis not present

## 2019-11-01 DIAGNOSIS — G5791 Unspecified mononeuropathy of right lower limb: Secondary | ICD-10-CM | POA: Diagnosis not present

## 2019-11-01 DIAGNOSIS — H6523 Chronic serous otitis media, bilateral: Secondary | ICD-10-CM | POA: Diagnosis not present

## 2019-11-01 DIAGNOSIS — I495 Sick sinus syndrome: Secondary | ICD-10-CM | POA: Diagnosis not present

## 2019-11-01 DIAGNOSIS — Z950 Presence of cardiac pacemaker: Secondary | ICD-10-CM | POA: Diagnosis not present

## 2019-11-01 DIAGNOSIS — K219 Gastro-esophageal reflux disease without esophagitis: Secondary | ICD-10-CM | POA: Diagnosis not present

## 2019-11-01 DIAGNOSIS — E875 Hyperkalemia: Secondary | ICD-10-CM | POA: Diagnosis not present

## 2019-11-01 DIAGNOSIS — Z5181 Encounter for therapeutic drug level monitoring: Secondary | ICD-10-CM | POA: Diagnosis not present

## 2019-11-01 DIAGNOSIS — I272 Pulmonary hypertension, unspecified: Secondary | ICD-10-CM | POA: Diagnosis not present

## 2019-11-01 DIAGNOSIS — I480 Paroxysmal atrial fibrillation: Secondary | ICD-10-CM | POA: Diagnosis not present

## 2019-11-01 DIAGNOSIS — Z7901 Long term (current) use of anticoagulants: Secondary | ICD-10-CM | POA: Diagnosis not present

## 2019-11-01 DIAGNOSIS — H5462 Unqualified visual loss, left eye, normal vision right eye: Secondary | ICD-10-CM | POA: Diagnosis not present

## 2019-11-01 DIAGNOSIS — E559 Vitamin D deficiency, unspecified: Secondary | ICD-10-CM | POA: Diagnosis not present

## 2019-11-01 DIAGNOSIS — G2581 Restless legs syndrome: Secondary | ICD-10-CM | POA: Diagnosis not present

## 2019-11-01 DIAGNOSIS — Z8553 Personal history of malignant neoplasm of renal pelvis: Secondary | ICD-10-CM | POA: Diagnosis not present

## 2019-11-01 DIAGNOSIS — I2510 Atherosclerotic heart disease of native coronary artery without angina pectoris: Secondary | ICD-10-CM | POA: Diagnosis not present

## 2019-11-01 LAB — ECHOCARDIOGRAM COMPLETE
Height: 59 in
Weight: 1696 oz

## 2019-11-01 NOTE — Telephone Encounter (Signed)
Called and left a VM with [facility] at home to check INR on Monday 3/1. Will await call back

## 2019-11-01 NOTE — Telephone Encounter (Signed)
  [facility]VE TEAM   Patient contacted regarding discharge from [facility] on 10/31/19  Patient understands to follow up with provider Nell Range on 3/4 at [facility].  Patient understands discharge instructions? yes Patient understands medications and regimen? yes Patient understands to bring all medications to this visit? Yes  Pt is doing quite well. She has a moderate perivalvular leak. Fortunately, the patient has had a big clinical improvement since TAVR and breathing much easier. I spoke to her daughter at length about s/s of potential issues. She will be vigilant in watching her symptoms and call us immediatly if there is any change. I will see her for close follow up in the office next week. I am working on seeing if we can add a limited echo to her visit that day to watch PVL. I also moved up her 1 month echo to the 23 day mark.    Angelena Form PA-C  [facility]

## 2019-11-02 ENCOUNTER — Telehealth: Payer: Self-pay | Admitting: Cardiology

## 2019-11-02 ENCOUNTER — Other Ambulatory Visit: Payer: Self-pay | Admitting: Physician Assistant

## 2019-11-02 ENCOUNTER — Telehealth: Payer: Self-pay | Admitting: *Deleted

## 2019-11-02 ENCOUNTER — Other Ambulatory Visit (HOSPITAL_COMMUNITY): Payer: Medicare Other

## 2019-11-02 DIAGNOSIS — Z952 Presence of prosthetic heart valve: Secondary | ICD-10-CM

## 2019-11-02 MED FILL — Magnesium Sulfate Inj 50%: INTRAMUSCULAR | Qty: 10 | Status: AC

## 2019-11-02 MED FILL — Potassium Chloride Inj 2 mEq/ML: INTRAVENOUS | Qty: 40 | Status: AC

## 2019-11-02 MED FILL — Heparin Sodium (Porcine) Inj 1000 Unit/ML: INTRAMUSCULAR | Qty: 30 | Status: AC

## 2019-11-02 MED FILL — Potassium Chloride Inj 2 mEq/ML: INTRAVENOUS | Qty: 60 | Status: CN

## 2019-11-02 NOTE — Telephone Encounter (Signed)
Called [facility] again to confirm order was placed. Waited on hold for a long time then they stated that the clinical nurse manager would call me back

## 2019-11-02 NOTE — Telephone Encounter (Signed)
Nell Range, PA-C aware and is handling this patient/call at this time.

## 2019-11-02 NOTE — Telephone Encounter (Signed)
Pt c/o Shortness Of Breath: STAT if SOB developed within the last 24 hours or pt is noticeably SOB on the phone  1. Are you currently SOB (can you hear that pt is SOB on the phone)? No  2. How long have you been experiencing SOB? 2 days  3. Are you SOB when sitting or when up moving around? When sitting   4. Are you currently experiencing any other symptoms? No

## 2019-11-02 NOTE — Telephone Encounter (Signed)
Called patient on 11/02/2019 , 9:40 AM in an attempt to reach the patient for a hospital follow up. Spoke with the patient's daughter, [name]. Per [name], the patient is not doing well and she has called Dr Guy Sandifer office and is expecting a call. She states the patient is weak and is feeling worse than prior to her procedure. Admit date: 10/30/19 Discharge: 10/31/19   She does not have any questions or concerns about medications from the hospital admission. The patient's medications were reviewed over the phone, they were counseled to bring in all current medications to the hospital follow up visit.   I advised the patient to call if any questions or concerns arise about the hospital admission or medications    Home health was started in the hospital.  All questions were answered and a follow up appointment was made. Patient has an appointment 11/07/2019,  with Vicie Mutters, for a hospital follow. Daughter will call office, if patient is unable to come.  Prior to Admission medications   Medication Sig Start Date End Date Taking? Authorizing Provider  acetaminophen (TYLENOL) 500 MG tablet Take 500-1,000 mg by mouth every 6 (six) hours as needed for mild pain or headache.     [provider]  ALPRAZolam Duanne Moron) 0.5 MG tablet Take 0.5 mg by mouth 2 (two) times daily as needed for anxiety.     [provider]  Alum & Mag Hydroxide-Simeth (MYLANTA PO) Take 20 mLs by mouth every evening.     [provider]  Ascorbic Acid (VITAMIN C) 1000 MG tablet Take 1,000 mg by mouth daily.    [provider]  Biotin 10000 MCG TBDP Take 10,000 mcg by mouth daily.    [provider]  Cholecalciferol (VITAMIN D-3) 5000 units TABS Take 5,000 Units by mouth 3 (three) times daily.     [provider]  erythromycin ophthalmic ointment Place 1 application into both eyes at bedtime as needed (stye).     [provider]  famotidine (PEPCID) 40 MG tablet Take 1  tablet Daily for Indigestion & Heartburn Patient taking differently: Take 40 mg by mouth daily.  05/21/19   [name], MD  ferrous sulfate 325 (65 FE) MG tablet Take 325 mg by mouth daily in the afternoon.    [provider]  furosemide (LASIX) 20 MG tablet Take 20 mg by mouth daily as needed for fluid.     [provider]  gabapentin (NEURONTIN) 300 MG capsule TAKE 1 CAPSULE BY MOUTH THREE TIMES A DAY Patient taking differently: Take 300 mg by mouth 2 (two) times daily.  10/10/19   [name], MD  levothyroxine (SYNTHROID) 100 MCG tablet Take 1/2 tablet daily for thyroid. Patient taking differently: Take 50 mcg by mouth daily before breakfast.  08/16/19   [name], MD  loperamide (IMODIUM A-D) 2 MG tablet Take up to 12 tablets /day as needed for  for Diarrhea Patient not taking: Reported on 10/24/2019 01/30/19   [name], MD  loratadine (CLARITIN) 10 MG tablet Take 10 mg by mouth at bedtime.     [provider]  losartan (COZAAR) 25 MG tablet Take 2 tablets (50 mg total) by mouth daily. Patient taking differently: Take 50 mg by mouth every evening.  09/20/19   Kroeger, Lorelee Cover., PA-C  metoprolol tartrate (LOPRESSOR) 100 MG tablet Take 1 tablet 2 x /day for BP Patient taking differently: Take 100 mg by mouth 2 (two) times daily.  03/13/19   Melford Aase,  Gwyndolyn Saxon, MD  nitroGLYCERIN (NITROSTAT) 0.4 MG SL tablet Dissolve 1 tablet under tongue every 5 minutes if needed for Angina Patient taking differently: Place 0.4 mg under the tongue every 5 (five) minutes as needed for chest pain.  09/20/19   [name], MD  ondansetron (ZOFRAN-ODT) 8 MG disintegrating tablet Take 8 mg by mouth every 8 (eight) hours as needed for nausea or vomiting.     [provider]  potassium chloride (KLOR-CON) 10 MEQ tablet Take 1 tablet (10 mEq total) by mouth daily. Patient taking differently: Take 10 mEq by mouth daily as needed (with lasix).  09/21/19   [name], MD  Propylene Glycol (SYSTANE COMPLETE) 0.6 % SOLN Place 1 drop into both eyes 3 (three) times daily as needed (irritation/dry eyes.).    [provider]  simethicone (MYLICON) 80 MG chewable tablet Chew 80 mg by mouth every 6 (six) hours as needed for flatulence.    [provider]  sucralfate (CARAFATE) 1 g tablet TAKE 1 TABLET (1 G TOTAL) BY MOUTH 4 (FOUR) TIMES DAILY - WITH MEALS AND AT BEDTIME. Patient taking differently: Take 1 g by mouth in the morning, at noon, and at bedtime.  10/10/19   [name], MD  tobramycin-dexamethasone [facility]) ophthalmic solution Place 1 drop into both eyes daily as needed (stye).     [provider]  triamcinolone cream (KENALOG) 0.1 % Apply 1 application topically 2 (two) times daily as needed (rash/skin irritation.).     [provider]  verapamil (CALAN-SR) 120 MG CR tablet Take 1 tablet 2 x /day with a meal for BP & Heart Rhythm Patient taking differently: Take 120 mg by mouth 2 (two) times daily.  01/30/19   [name], MD  warfarin (COUMADIN) 2 MG tablet TAKE 1 TO 2 TABLETS AS DIRECTED TO PREVENT BLOOD CLOTS Patient taking differently: Take 2 mg by mouth every evening.  07/19/19   [name], MD

## 2019-11-02 NOTE — Telephone Encounter (Signed)
Patient was admitted on 2/23 for TAVR. Pre op labs showed a markedly elevated BNP > 2000 and some mild pulmonary congestion on CXR. Patient had at least moderate residual PVL on post op echo. There was some concern for potential valve migration. However, the patient was feeling quite well with a marked clinical improvement in her breathing and was discharged on POD 1 (10/31/19).   The patient's daughter called our office today complaining of acutely worsening shortness of breath, chest pressure, weight gain and orthopnea. Chest pressure improved with SL NTG. Weight up to 106 lbs (she was 99 lbs when seen in the office by Dr. Roxy Manns preoperatively). She tried 20mg  of lasix with no improvement or increase in urine output.  I asked the patient to try 40mg  Lasix which has led to a dramatic increase in UOP and symptoms. I have asked her to have her take another 40mg  (and extra potassium supplementation) again tomorrow. She has my cell phone number to call to with an update. If she continues to improve clinically and weight returns to baseline, we will plan to see her in the office early next week (Monday) with a limited echo. If she does not continue to improve or worsens they will bring her to the ER immediately.   We will alert the on call team this weekend in case she needs to be admitted.   Angelena Form PA-C  [facility]

## 2019-11-05 ENCOUNTER — Ambulatory Visit (INDEPENDENT_AMBULATORY_CARE_PROVIDER_SITE_OTHER): Payer: Medicare Other | Admitting: Pharmacist

## 2019-11-05 ENCOUNTER — Ambulatory Visit: Payer: Medicare Other | Admitting: Physician Assistant

## 2019-11-05 ENCOUNTER — Ambulatory Visit: Payer: Medicare Other | Admitting: Cardiovascular Disease

## 2019-11-05 ENCOUNTER — Ambulatory Visit (HOSPITAL_COMMUNITY): Payer: Medicare Other | Attending: Cardiology

## 2019-11-05 ENCOUNTER — Other Ambulatory Visit: Payer: Self-pay

## 2019-11-05 VITALS — BP 160/80 | HR 78 | Ht 59.0 in | Wt 100.8 lb

## 2019-11-05 DIAGNOSIS — I10 Essential (primary) hypertension: Secondary | ICD-10-CM

## 2019-11-05 DIAGNOSIS — I480 Paroxysmal atrial fibrillation: Secondary | ICD-10-CM

## 2019-11-05 DIAGNOSIS — I5033 Acute on chronic diastolic (congestive) heart failure: Secondary | ICD-10-CM | POA: Diagnosis not present

## 2019-11-05 DIAGNOSIS — Z952 Presence of prosthetic heart valve: Secondary | ICD-10-CM | POA: Insufficient documentation

## 2019-11-05 DIAGNOSIS — Z7901 Long term (current) use of anticoagulants: Secondary | ICD-10-CM | POA: Diagnosis not present

## 2019-11-05 DIAGNOSIS — I4821 Permanent atrial fibrillation: Secondary | ICD-10-CM

## 2019-11-05 LAB — POCT INR: INR: 1.2 — AB (ref 2.0–3.0)

## 2019-11-05 MED ORDER — FUROSEMIDE 20 MG PO TABS: 20.0000 mg | ORAL_TABLET | Freq: Every day | ORAL | 3 refills | Status: DC

## 2019-11-05 MED ORDER — POTASSIUM CHLORIDE ER 10 MEQ PO TBCR: 10.0000 meq | EXTENDED_RELEASE_TABLET | Freq: Every day | ORAL | 3 refills | Status: DC

## 2019-11-05 NOTE — Patient Instructions (Addendum)
Medication Instructions:  1) Take Lasix 20 mg daily 2) Take Potassium 10 meq daily *If you need a refill on your cardiac medications before your next appointment, please call your pharmacy*  Lab Work: TODAY: BMET If you have labs (blood work) drawn today and your tests are completely normal, you will receive your results only by: [name] MyChart Message (if you have MyChart) OR . A paper copy in the mail If you have any lab test that is abnormal or we need to change your treatment, we will call you to review the results.  Follow-Up: Please keep your follow-up appointments!

## 2019-11-05 NOTE — Progress Notes (Signed)
Called Tabitha for [facility] and placed order for patient to have her INR checked next Monday 11/12/19

## 2019-11-05 NOTE — Progress Notes (Signed)
[facility]                                       Cardiology Office Note    Date:  11/05/2019   ID:  [name], DOB Jun 19, 1934, MRN [id number]  PCP:  [name], MD  Cardiologist:  [name], MD / Dr. Angelena Form & Dr. Roxy Manns (TAVR)  CC: TOC s/p TAVR   History of Present Illness:  [name] is a 86 y.o. female with a history of chronic diastolic CHF, Hx of ocular stroke, persistent afib on coumadin, tachy-brady syndrome s/p PPM, HTN, HLD, gastric cancer,renal cell carcinoma s/p nephrectomy, radiation proctitis,CKD stage III,anemia, recent bladder cancer, Jehovah's witness faith,and LFLG severe aortic stenosis s/p TAVR (10/30/19) c/b moderate PVL who presents to clinic for follow up.   Patient has a longstanding history of chronic diastolic congestive heart failure and atrial fibrillation. She was followed for many years by Dr. Mare Ferrari and more recently by Dr. [name]. She underwent permanent pacemaker implantation in the distant past for tachybradycardia syndrome and has been on long-term warfarin anticoagulation for many years. She has developed progressive exertional shortness of breath, fatigue, and fluid retention over the past 2 years. She has required escalating doses of diuretic therapy and she has been staying in persistent atrial fibrillation. Follow-up transthoracic echocardiogram performed September 17, 2019 findings consistent with paradoxical low flow low gradient severe aortic stenosis.Ejection fraction was estimated 60 to 65%. The aortic valve was severely calcified with severely restricted leaflet motion.Peak velocity across aortic valve measured 3.3 m/s corresponding to mean transvalvular gradient estimated 25 mmHg. The DVI was notably quite low at 0.21 with aortic valve area calculated 0.60 cm and stroke-volume only 43 mL with stroke-volume index 31 mL/m. Diagnostic cardiac catheterization performed by  Dr. [name] on October 09, 2019 revealed nonobstructive coronary artery disease. Peak to peak and mean transvalvular gradients across aortic valve were measured 19 and 11.4 mmHg respectively corresponding to aortic valve area calculated only 0.84 cm.  The patient was evaluated by the multidisciplinary valve team and underwent successful TAVR with a 23 mm Edwards Sapien 3 Ultra THV via the left carotid approach on 10/30/19. Post operative echo showed EF 70% s/p TAVR with mild-mod PVL and mean gradient of 7 mm Hg. Of note, pre op labs showed a markedly elevated BNP > 2000 and some mild pulmonary congestion on CXR. There was some concern for potential valve migration. However, the patient was feeling quite well with a marked clinical improvement in her breathing and was discharged on POD 1 (10/31/19). She was discharged on coumadin alone given high risk of bleed.   Her daughter called in on Friday 11/02/19 to report a 7 lbs weight gain (99lbs --> 106lbs), acutely worsening shortness of breath, chest pressure, weight gain and orthopnea. Chest pressure improved with SL NTG. She tried 20mg  of lasix with no improvement or increase in urine output. I asked her to increase lasix to 40mg  x 2 days and call me back. Her office appointment was moved up to today.  Today she presents to clinic for follow up. She improved dramatically with increased lasix. She reports persistent fatigue but markedly improved SOB, orthopnea, LE edema, and chest pressure. Weight down to 98 lbs on home scale. She has chronic right lower extremity edema. No dizziness or syncope. She is able to walk to  the bathroom without significant dyspnea. Daughter reports labile BPs at home.  Past Medical History:  Diagnosis Date  . Adenocarcinoma of stomach ([place]) 11/11/2015   gastric mass on egd,   . Anal fissure   . Anemia    hx 3/17 iron infusion, on aranesp and injected B12 since 11/2015.   [name] Anxiety   . Aortic root dilatation ([place])   . Arthritis      oa  . Blind left eye 2015   partial blind in left eye due to stroke   . 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 CAD (coronary artery disease)   . Cancer (HCC)    hx bladder, kidney, uterus  . Cataracts, bilateral   . Chronic kidney disease    only has one kidney rt lft rem 03 ca     dr. Risa Grill, bladder cancer 05-04-2018  . Elevated LFTs    hepatic steatosis, none recent  . Gastroparesis   . GERD (gastroesophageal reflux disease)   . Hearing loss    wears hearing aids  . History of uterine cancer 1980s   treated with hysterectomy, external radiation and radiation seed implants.   [name] HTN (hypertension)   . Hyperlipemia   . Hypothyroidism   . Iron deficiency anemia due to chronic blood loss 11/24/2015  . Neuropathy    right leg  . Osteomyelitis ([place]) as child  . PAF (paroxysmal atrial fibrillation) ([place])   . Presence of permanent cardiac pacemaker   . Radiation proctitis   . Refusal of blood transfusions as patient is Jehovah's Witness   . Restless leg syndrome   . 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 S/P TAVR (transcatheter aortic valve replacement)   . Severe aortic stenosis   . Stroke [facility]) 2015   partial stroke left legally blind; left eye   . Tachycardia-bradycardia syndrome ([place])    s/p PPM by Dr Doreatha Lew (MDT) 07/03/10  . Walker as ambulation aid   . Wears glasses   . Wears partial dentures    upper    Past Surgical History:  Procedure Laterality Date  . CARDIAC CATHETERIZATION  2008  . CHOLECYSTECTOMY  1970s  . COLONOSCOPY N/A 11/11/2015   Procedure: COLONOSCOPY;  Surgeon: [name], MD;  [facility];  Service: Endoscopy;  Laterality: N/A;  . CYSTOSCOPY W/ RETROGRADES Right 05/01/2018   Procedure: CYSTOSCOPY WITH RIGHT RETROGRADE PYELOGRAM;  Surgeon: Lucas Mallow, MD;  [facility];  Service: Urology;  Laterality: Right;  . CYSTOSCOPY W/  RETROGRADES Right 07/21/2018   Procedure: CYSTOSCOPY WITH RIGHT RETROGRADE PYELOGRAM;  Surgeon: Alexis Frock, MD;  [facility];  Service: Urology;  Laterality: Right;  . ESOPHAGOGASTRODUODENOSCOPY N/A 11/11/2015   Procedure: ESOPHAGOGASTRODUODENOSCOPY (EGD);  Surgeon: [name], MD;  Location[facility];  Service: Endoscopy;  Laterality: N/A;  . ESOPHAGOGASTRODUODENOSCOPY N/A 02/05/2016   Procedure: ESOPHAGOGASTRODUODENOSCOPY (EGD);  Surgeon: Ladene Artist, MD;  Location: [facility];  Service: Endoscopy;  Laterality: N/A;  . ESOPHAGOGASTRODUODENOSCOPY N/A 04/02/2016   Procedure: ESOPHAGOGASTRODUODENOSCOPY (EGD);  Surgeon: [name], MD;  Location: [facility];  Service: Endoscopy;  Laterality: N/A;  . ESOPHAGOGASTRODUODENOSCOPY (EGD) WITH PROPOFOL N/A 04/06/2017   Procedure: ESOPHAGOGASTRODUODENOSCOPY (EGD) WITH PROPOFOL;  Surgeon: [name], MD;  [facility];  Service: Endoscopy;  Laterality: N/A;  . EYE SURGERY Bilateral    remove cataracts  . GASTRECTOMY N/A 01/15/2016  Procedure: OPEN PARTIAL GASTRECTOMY;  Surgeon: Stark Klein, MD;  Location: [facility];  Service: General;  Laterality: N/A;  . GASTRECTOMY Left 02/24/2016   Procedure: OPEN J TUBE;  Surgeon: Stark Klein, MD;  Location: [facility];  Service: General;  Laterality: Left;  [name] GASTROJEJUNOSTOMY N/A 05/18/2016   Procedure: ROUX EN [place];  Surgeon: Stark Klein, MD;  Location: [facility];  Service: General;  Laterality: N/A;  . I & D KNEE WITH POLY EXCHANGE Right 12/17/2013   Procedure: IRRIGATION AND DEBRIDEMEN RIGHT TOTAL  KNEE WITH POLY EXCHANGE;  Surgeon: Mauri Pole, MD;  [facility];  Service: Orthopedics;  Laterality: Right;  . IR GENERIC HISTORICAL  06/17/2016   IR RADIOLOGIST EVAL & MGMT 06/17/2016 Ascencion Dike, PA-C [facility] INTERV RAD  . LAPAROSCOPY N/A 01/15/2016   Procedure: LAPAROSCOPY DIAGNOSTIC;  Surgeon: Stark Klein, MD;  Location: [facility];  Service: General;  Laterality: N/A;    . LYSIS OF ADHESION  05/18/2016   Procedure: LYSIS OF ADHESION;  Surgeon: Stark Klein, MD;  Location: [facility];  Service: General;;  . NEPHRECTOMY Left    renal cell cancer  . PACEMAKER INSERTION  07/04/2011   MDT PPM implanted by Dr Doreatha Lew for tachy/brady; managed by Dr Jnyah Brazee Grayer   . PATELLAR TENDON REPAIR Right 03/06/2014   Procedure: AVULSION WITH PATELLA TENDON REPAIR;  Surgeon: Mauri Pole, MD;  [facility];  Service: Orthopedics;  Laterality: Right;  . PATENT DUCTUS ARTERIOUS REPAIR  ~ 1949  . RIGHT/LEFT HEART CATH AND CORONARY ANGIOGRAPHY N/A 10/09/2019   Procedure: RIGHT/LEFT HEART CATH AND CORONARY ANGIOGRAPHY;  Surgeon: [name], [name], MD;  Location: [facility];  Service: Cardiovascular;  Laterality: N/A;  . TEE WITHOUT CARDIOVERSION N/A 10/30/2019   Procedure: TRANSESOPHAGEAL ECHOCARDIOGRAM (TEE);  Surgeon: Burnell Blanks, MD;  Location: [facility];  Service: Open Heart Surgery;  Laterality: N/A;  . TOTAL ABDOMINAL HYSTERECTOMY  85  . TOTAL HIP REVISION Right 2009   initial replacment surg  . TOTAL KNEE ARTHROPLASTY  02/07/2012   Procedure: TOTAL KNEE ARTHROPLASTY;  Surgeon: Mauri Pole, MD;  [facility];  Service: Orthopedics;  Laterality: Right;  . TOTAL KNEE REVISION Right 07/29/2014   Procedure: RIGHT KNEE REVISION OF PREVIOUS REPAIR EXTENSOR MECHANISM;  Surgeon: Mauri Pole, MD;  [facility];  Service: Orthopedics;  Laterality: Right;  . TRANSCATHETER AORTIC VALVE REPLACEMENT, CAROTID Left 10/30/2019   TRANSCATHETER AORTIC VALVE REPLACEMENT, LEFT CAROTID (Left Neck)   . TRANSCATHETER AORTIC VALVE REPLACEMENT, CAROTID Left 10/30/2019   Procedure: TRANSCATHETER AORTIC VALVE REPLACEMENT, LEFT CAROTID;  Surgeon: Burnell Blanks, MD;  Location: [facility];  Service: Open Heart Surgery;  Laterality: Left;  . TRANSURETHRAL RESECTION OF BLADDER TUMOR N/A 07/21/2018   Procedure: TRANSURETHRAL RESECTION OF BLADDER TUMOR (TURBT);  Surgeon: Alexis Frock, MD;  [facility];  Service: Urology;  Laterality: N/A;  . TRANSURETHRAL RESECTION OF BLADDER TUMOR WITH MITOMYCIN-C N/A 05/01/2018   Procedure: TRANSURETHRAL RESECTION OF BLADDER TUMOR WITH POST OP INSTILLATION OF GEMCITABINE;  Surgeon: Lucas Mallow, MD;  [facility];  Service: Urology;  Laterality: N/A;  . WHIPPLE PROCEDURE N/A 05/18/2016   Procedure: EXPLORATORY LAPAROTOMY;  Surgeon: Stark Klein, MD;  Location: [facility];  Service: General;  Laterality: N/A;    Current Medications: Outpatient Medications Prior to Visit  Medication Sig Dispense Refill  . acetaminophen (TYLENOL) 500 MG tablet Take 500-1,000 mg by mouth every 6 (six) hours as needed for mild  pain or headache.     . ALPRAZolam (XANAX) 0.5 MG tablet Take 0.5 mg by mouth 2 (two) times daily as needed for anxiety.     . Alum & Mag Hydroxide-Simeth (MYLANTA PO) Take 20 mLs by mouth every evening.     . Ascorbic Acid (VITAMIN C) 1000 MG tablet Take 1,000 mg by mouth daily.    . Biotin 10000 MCG TBDP Take 10,000 mcg by mouth daily.    . Cholecalciferol (VITAMIN D-3) 5000 units TABS Take 5,000 Units by mouth 3 (three) times daily.     [name] erythromycin ophthalmic ointment Place 1 application into both eyes at bedtime as needed (stye).     . famotidine (PEPCID) 40 MG tablet Take 1 tablet Daily for Indigestion & Heartburn (Patient taking differently: Take 40 mg by mouth daily. ) 90 tablet 3  . ferrous sulfate 325 (65 FE) MG tablet Take 325 mg by mouth daily in the afternoon.    . gabapentin (NEURONTIN) 300 MG capsule TAKE 1 CAPSULE BY MOUTH THREE TIMES A DAY (Patient taking differently: Take 300 mg by mouth 2 (two) times daily. ) 270 capsule 1  . levothyroxine (SYNTHROID) 100 MCG tablet Take 1/2 tablet daily for thyroid. (Patient taking differently: Take 50 mcg by mouth daily before breakfast. ) 45 tablet 1  . loperamide (IMODIUM A-D) 2 MG tablet Take up to 12 tablets /day as needed for  for Diarrhea 120 tablet 11  .  loratadine (CLARITIN) 10 MG tablet Take 10 mg by mouth at bedtime.     [name] losartan (COZAAR) 25 MG tablet Take 2 tablets (50 mg total) by mouth daily. (Patient taking differently: Take 50 mg by mouth every evening. ) 360 tablet 1  . metoprolol tartrate (LOPRESSOR) 100 MG tablet Take 1 tablet 2 x /day for BP (Patient taking differently: Take 100 mg by mouth 2 (two) times daily. ) 30 tablet 6  . nitroGLYCERIN (NITROSTAT) 0.4 MG SL tablet Dissolve 1 tablet under tongue every 5 minutes if needed for Angina (Patient taking differently: Place 0.4 mg under the tongue every 5 (five) minutes as needed for chest pain. ) 50 tablet prn  . ondansetron (ZOFRAN-ODT) 8 MG disintegrating tablet Take 8 mg by mouth every 8 (eight) hours as needed for nausea or vomiting.     [name] Propylene Glycol (SYSTANE COMPLETE) 0.6 % SOLN Place 1 drop into both eyes 3 (three) times daily as needed (irritation/dry eyes.).    [name] simethicone (MYLICON) 80 MG chewable tablet Chew 80 mg by mouth every 6 (six) hours as needed for flatulence.    . sucralfate (CARAFATE) 1 g tablet TAKE 1 TABLET (1 G TOTAL) BY MOUTH 4 (FOUR) TIMES DAILY - WITH MEALS AND AT BEDTIME. (Patient taking differently: Take 1 g by mouth in the morning, at noon, and at bedtime. ) 360 tablet 3  . tobramycin-dexamethasone (TOBRADEX) ophthalmic solution Place 1 drop into both eyes daily as needed (stye).     [name] triamcinolone cream (KENALOG) 0.1 % Apply 1 application topically 2 (two) times daily as needed (rash/skin irritation.).     [name] verapamil (CALAN-SR) 120 MG CR tablet Take 1 tablet 2 x /day with a meal for BP & Heart Rhythm (Patient taking differently: Take 120 mg by mouth 2 (two) times daily. ) 180 tablet 3  . warfarin (COUMADIN) 2 MG tablet TAKE 1 TO 2 TABLETS AS DIRECTED TO PREVENT BLOOD CLOTS (Patient taking differently: Take 2 mg by mouth every evening. ) 180 tablet 1  .  furosemide (LASIX) 20 MG tablet Take 20 mg by mouth daily as needed for fluid.     . potassium  chloride (KLOR-CON) 10 MEQ tablet Take 1 tablet (10 mEq total) by mouth daily. (Patient taking differently: Take 10 mEq by mouth daily as needed (with lasix). ) 90 tablet 1   No facility-administered medications prior to visit.     Allergies:   Amiodarone, Adhesive [tape], Hydrocodone, Other, Reglan [metoclopramide], Remeron [mirtazapine], Codeine, Morphine and related, Requip [ropinirole hcl], and Zinc   Social History   Socioeconomic History  . Marital status: Widowed    Spouse name: Not on file  . Number of children: 3  . Years of education: Not on file  . Highest education level: Not on file  Occupational History  . Not on file  Tobacco Use  . Smoking status: Never Smoker  . Smokeless tobacco: Never Used  Substance and Sexual Activity  . Alcohol use: No  . Drug use: No  . Sexual activity: Never    Comment: Hysterectomy  Other Topics Concern  . Not on file  Social History Narrative   01/01/19 Lives in [place] w/dgtr, [name].  Continues to work.   Social Determinants of Health   Financial Resource Strain:   . Difficulty of Paying Living Expenses: Not on file  Food Insecurity:   . Worried About Charity fundraiser in the Last Year: Not on file  . Ran Out of Food in the Last Year: Not on file  Transportation Needs:   . Lack of Transportation (Medical): Not on file  . Lack of Transportation (Non-Medical): Not on file  Physical Activity:   . Days of Exercise per Week: Not on file  . Minutes of Exercise per Session: Not on file  Stress:   . Feeling of Stress : Not on file  Social Connections:   . Frequency of Communication with Friends and Family: Not on file  . Frequency of Social Gatherings with Friends and Family: Not on file  . Attends Religious Services: Not on file  . Active Member of Clubs or Organizations: Not on file  . Attends Archivist Meetings: Not on file  . Marital Status: Not on file     Family History:  The patient'sfamily history  includes Colon cancer (age of onset: 60) in her mother; Heart attack in her father; Heart disease in her father, maternal grandmother, and mother.     ROS:   Please see the history of present illness.    ROS All other systems reviewed and are negative.   PHYSICAL EXAM:   VS:  BP (!) 160/80   Pulse 78   Ht 4\' 11"  (1.499 m)   Wt 100 lb 12.8 oz (45.7 kg)   LMP  (LMP Unknown)   BMI 20.36 kg/m    GEN: Well nourished, well developed, in no acute distress HEENT: normal Neck: no JVD or masses Cardiac: irrieg irreg; 2/6 diastolic murmur heart best at RUSB. No rubs, or gallops. 1+ right LE edema.  Respiratory:  clear to auscultation bilaterally, normal work of breathing GI: soft, nontender, nondistended, + BS MS: no deformity or atrophy Skin: warm and dry, no rash Neuro:  Alert and Oriented x 3, Strength and sensation are intact Psych: euthymic mood, full affect   Wt Readings from Last 3 Encounters:  11/05/19 100 lb 12.8 oz (45.7 kg)  10/31/19 106 lb (48.1 kg)  10/26/19 101 lb 6.4 oz (46 kg)      Studies/Labs  Reviewed:   EKG:  EKG is NOT ordered today.   Recent Labs: 03/02/2019: NT-Pro BNP 10,870 08/08/2019: TSH 2.23 10/26/2019: ALT 26; B Natriuretic Peptide 2,138.2 10/31/2019: BUN 17; Creatinine, Ser 1.11; Hemoglobin 12.4; Magnesium 1.6; Platelets 141; Potassium 3.8; Sodium 146   Lipid Panel    Component Value Date/Time   CHOL 113 08/08/2019 1030   TRIG 100 08/08/2019 1030   HDL 40 (L) 08/08/2019 1030   CHOLHDL 2.8 08/08/2019 1030   VLDL 29 03/23/2017 1445   LDLCALC 54 08/08/2019 1030    Additional studies/ records that were reviewed today include:  TAVR OPERATIVE NOTE   Date of Procedure:10/30/2019  Preoperative Diagnosis:Severe Aortic Stenosis   Postoperative Diagnosis:Same   Procedure:   Transcatheter Aortic Valve Replacement - OpenLeftTranscarotid Approach Edwards Sapien 3 THV (size 58mm, model # 9600TFX,  serial # [id number])  Co-Surgeons:Clarence H. Roxy Manns, MD and [name], MD  Anesthesiologist:Chris Ermalene Postin, MD  Dala Dock, MD  Pre-operative Echo Findings: ? Paradoxical Low Flow with Normal EFSevere aortic stenosis ? Normalleft ventricular systolic function  Post-operative Echo Findings: ? Mildparavalvular leak ? Normalleft ventricular systolic function  _____________   Echo 10/31/19: IMPRESSIONS 1. Left ventricular ejection fraction, by estimation, is 70 to 75%. The  left ventricle has hyperdynamic function. The left ventricle has no  regional wall motion abnormalities. There is moderate concentric left  ventricular hypertrophy. Left ventricular  diastolic function could not be evaluated.  2. Right ventricular systolic function is normal. The right ventricular  size is normal. There is moderately elevated pulmonary artery systolic  pressure. The estimated right ventricular systolic pressure is [id number] mmHg.  3. Left atrial size was mildly dilated.  4. Right atrial size was mildly dilated.  5. Large pleural effusion in the left lateral region.  6. The mitral valve is normal in structure and function. Moderate mitral  valve regurgitation. No evidence of mitral stenosis.  7. There is mild paravalvular leak (at 5'clock on short axis), most  prominent on images 82, 83 where it appears mild to moderate.   Normal peak/mean transaortic gradients 15/7 mmHg.. The aortic valve  has been repaired/replaced. Aortic valve regurgitation is not visualized.  No aortic stenosis is present. Echo findings are consistent with  perivalvular leak of the aortic prosthesis.  Aortic valve mean gradient measures 7.0 mmHg.  8. The inferior vena cava is normal in size with greater than 50%  respiratory variability, suggesting right atrial pressure of 3 mmHg.   ___________________  Limited  echo 11/05/19: Pending formal read   ASSESSMENT & PLAN:   Acute on chronic diastolic CHF: appears euvolemic now. She responded nicely to increased lasix with brisk diuresis. Now back to her baseline weight and feeling much improved. I have asked her to continue on lasix 20mg  daily instead of PRN. Check BMET today.   Severe AS s/p TAVR: limited echo today shows stable TAVR with moderate PVL. Will send to Dr. Roxy Manns and Dr Angelena Form for review.   Persistent atrial fibrillaiton: rate well controlled today. Will go to coumadin clinic following this appt for INR check.   HTN: BP 150/60 on my personal recheck. They report labile BPs at home. I have asked them to start lasix daily as above. I also asked them to keep a log of her Bps and call me with them next week. She may need an additional antihypertensive to get her to goal  CKD stage III: creat ~ 1.1 at dsicharge. Will check a repeat BMET today given increased lasix over the  weekend and starting daily lasix.   TAA: pre TAVR CT showed stable ectatic 4.0 cm ascending thoracic aorta. Recommend annual imaging followup by CTA or MRA. Will follow over time.  _____________     Medication Adjustments/Labs and Tests Ordered: Current medicines are reviewed at length with the patient today.  Concerns regarding medicines are outlined above.  Medication changes, Labs and Tests ordered today are listed in the Patient Instructions below. Patient Instructions  Medication Instructions:  1) Take Lasix 20 mg daily 2) Take Potassium 10 meq daily *If you need a refill on your cardiac medications before your next appointment, please call your pharmacy*  Lab Work: TODAY: BMET If you have labs (blood work) drawn today and your tests are completely normal, you will receive your results only by: [name] MyChart Message (if you have MyChart) OR . A paper copy in the mail If you have any lab test that is abnormal or we need to change your treatment, we will call you to  review the results.  Follow-Up: Please keep your follow-up appointments!     Signed, Angelena Form, PA-C  11/05/2019 1:49 PM    [facility] [address], [place], [place]  [address] Phone: [phone]; Fax: [phone]

## 2019-11-05 NOTE — Patient Instructions (Signed)
Take 1.5 tablets (3mg ) tonight and tomorrow then continue on same dosage of Warfarin 2mg  daily. Recheck INR in 1 week. Call us with any medication changes or concerns # [phone].

## 2019-11-06 LAB — BASIC METABOLIC PANEL
BUN/Creatinine Ratio: 21 (ref 12–28)
BUN: 19 mg/dL (ref 8–27)
CO2: 23 mmol/L (ref 20–29)
Calcium: 8.8 mg/dL (ref 8.7–10.3)
Chloride: 106 mmol/L (ref 96–106)
Creatinine, Ser: 0.91 mg/dL (ref 0.57–1.00)
GFR calc Af Amer: 67 mL/min/{1.73_m2} (ref >59)
GFR calc non Af Amer: 59 mL/min/{1.73_m2} — ABNORMAL LOW (ref >59)
Glucose: 84 mg/dL (ref 65–99)
Potassium: 4.1 mmol/L (ref 3.5–5.2)
Sodium: 143 mmol/L (ref 134–144)

## 2019-11-06 NOTE — Progress Notes (Signed)
Hospital follow up  Assessment and Plan: Hospital visit follow up for   S/P TAVR (transcatheter aortic valve replacement) Patient is doing well after the surgery, no signs of infection Still has murmur and mild/moderate regurg that is being monitored.  ? Would benefit from sleep study- daughter states she does stop breathing in the night, in the supine position mostly, however this could be causing extra strain on her heart/lungs and may want to be addressed in the future, discussed with daughter and patient.   Iron deficiency anemia due to chronic blood loss Monitor labs  Malignant neoplasm of kidney excluding renal pelvis, unspecified laterality (HCC) Monitor  Acute on chronic diastolic heart failure (HCC) Monitor weight, no edema.   Permanent atrial fibrillation (HCC) Continue cardio follow up and meds  Tachycardia-bradycardia syndrome ([place]) Continue cardio follow up and meds   All medications were reviewed with patient and family and fully reconciled. All questions answered fully, and patient and family members were encouraged to call the office with any further questions or concerns. Discussed goal to avoid readmission related to this diagnosis.  There are no discontinued medications.  Over 40 minutes of exam, counseling, chart review, and complex, high/moderate level critical decision making was performed this visit.   Future Appointments  Date Time Provider [place]  11/22/2019  3:00 PM MC[facility]CV [facility] ECHO 4 [id number] [facility]  11/22/2019  4:00 PM Eileen Stanford, PA-C [facility]  11/29/2019  7:30 AM [facility] DEVICE REMOTES [facility]  02/12/2020  2:00 PM [name], MD [facility] None  02/28/2020  7:30 AM [facility] DEVICE REMOTES [facility]  05/29/2020  7:30 AM [facility] DEVICE REMOTES [facility]     HPI 86 y.o.female presents for follow up for transition from recent hospitalization or SNIF  stay. Admit date to the hospital was 10/30/19, patient was discharged from the hospital on 10/31/19 and our clinical staff contacted the office the day after discharge to set up a follow up appointment. The discharge summary, medications, and diagnostic test results were reviewed before meeting with the patient. The patient was admitted for:  Severe AS s/p TAVR with Dr. Clydene Laming and Angelena Form,   Daughter has been having an issue with her blood pressure, her lasix was increased to 20mg  and she was put on potassium as well, started daily 03/01, was on 40 mg prior to to that.   She is back on coumadin will have checked Monday. 2mg  a day. Kinred is checking that at home due to continuing shortness of breath and difficulty for the patient to leave the house  Lab Results  Component Value Date   INR 1.2 (A) 11/05/2019   INR 1.1 10/30/2019   INR 1.8 (H) 10/26/2019   Daughter has been sleeping with her and states when she is in her supine position she is stopping to breath several times. Has never had sleep study.   Lab Results  Component Value Date   CREATININE 0.91 11/05/2019   BUN 19 11/05/2019   NA 143 11/05/2019   K 4.1 11/05/2019   CL 106 11/05/2019   CO2 23 11/05/2019     Lab Results  Component Value Date   GFRNONAA 47 (L) 11/05/2019    Lab Results  Component Value Date   TSH 2.23 08/08/2019   BMI is Body mass index is 20.04 kg/m., she is working on diet and exercise. Wt Readings from Last 3 Encounters:  11/07/19 99 lb 3.2 oz (45 kg)  11/05/19 100 lb 12.8 oz (45.7 kg)  10/31/19 106 lb (48.1 kg)   Home health is involved.   Images while in the hospital: DG Chest 2 View  Result Date: 10/26/2019 CLINICAL DATA:  86 year old female with shortness of breath. EXAM: CHEST - 2 VIEW COMPARISON:  Chest radiograph dated 02/27/2019. FINDINGS: There is emphysematous changes of the lungs with flattening of the diaphragms. Probable small bilateral pleural effusions similar to prior radiograph. No  focal consolidation or pneumothorax. The cardiac silhouette is within normal limits. Atherosclerotic calcification of the aorta. Right pectoral pacemaker device. No acute osseous pathology. IMPRESSION: 1. Emphysema.  No focal consolidation or pneumothorax. 2. Probable small bilateral pleural effusions similar to prior radiograph. Electronically Signed   By: Anner Crete M.D.   On: 10/26/2019 20:54     Current Outpatient Medications (Endocrine & Metabolic):  .  levothyroxine (SYNTHROID) 100 MCG tablet, Take 1/2 tablet daily for thyroid. (Patient taking differently: Take 50 mcg by mouth daily before breakfast. )  Current Outpatient Medications (Cardiovascular):  .  furosemide (LASIX) 20 MG tablet, Take 1 tablet (20 mg total) by mouth daily. [name]  losartan (COZAAR) 25 MG tablet, Take 2 tablets (50 mg total) by mouth daily. (Patient taking differently: Take 50 mg by mouth every evening. ) .  metoprolol tartrate (LOPRESSOR) 100 MG tablet, Take 1 tablet 2 x /day for BP (Patient taking differently: Take 100 mg by mouth 2 (two) times daily. ) .  nitroGLYCERIN (NITROSTAT) 0.4 MG SL tablet, Dissolve 1 tablet under tongue every 5 minutes if needed for Angina (Patient taking differently: Place 0.4 mg under the tongue every 5 (five) minutes as needed for chest pain. ) .  verapamil (CALAN-SR) 120 MG CR tablet, Take 1 tablet 2 x /day with a meal for BP & Heart Rhythm (Patient taking differently: Take 120 mg by mouth 2 (two) times daily. )  Current Outpatient Medications (Respiratory):  .  loratadine (CLARITIN) 10 MG tablet, Take 10 mg by mouth at bedtime.   Current Outpatient Medications (Analgesics):  .  acetaminophen (TYLENOL) 500 MG tablet, Take 500-1,000 mg by mouth every 6 (six) hours as needed for mild pain or headache.   Current Outpatient Medications (Hematological):  .  ferrous sulfate 325 (65 FE) MG tablet, Take 325 mg by mouth daily in the afternoon. .  warfarin (COUMADIN) 2 MG tablet, TAKE 1 TO 2  TABLETS AS DIRECTED TO PREVENT BLOOD CLOTS (Patient taking differently: Take 2 mg by mouth every evening. )  Current Outpatient Medications (Other):  [name]  ALPRAZolam (XANAX) 0.5 MG tablet, Take 0.5 mg by mouth 2 (two) times daily as needed for anxiety.  .  Alum & Mag Hydroxide-Simeth (MYLANTA PO), Take 20 mLs by mouth every evening.  .  Ascorbic Acid (VITAMIN C) 1000 MG tablet, Take 1,000 mg by mouth daily. .  Biotin 10000 MCG TBDP, Take 10,000 mcg by mouth daily. .  Cholecalciferol (VITAMIN D-3) 5000 units TABS, Take 5,000 Units by mouth 3 (three) times daily.  [name]  erythromycin ophthalmic ointment, Place 1 application into both eyes at bedtime as needed (stye).  .  famotidine (PEPCID) 40 MG tablet, Take 1 tablet Daily for Indigestion & Heartburn (Patient taking differently: Take 40 mg by mouth daily. ) .  gabapentin (NEURONTIN) 300 MG capsule, TAKE 1 CAPSULE BY MOUTH THREE TIMES A DAY (Patient taking differently: Take 300 mg by mouth 2 (two) times daily. ) .  loperamide (IMODIUM A-D) 2 MG tablet, Take up to 12 tablets /day as needed for  for Diarrhea .  ondansetron (ZOFRAN-ODT) 8 MG disintegrating tablet, Take 8 mg by mouth every 8 (eight) hours as needed for nausea or vomiting.  .  potassium chloride (KLOR-CON) 10 MEQ tablet, Take 1 tablet (10 mEq total) by mouth daily. [name]  Propylene Glycol (SYSTANE COMPLETE) 0.6 % SOLN, Place 1 drop into both eyes 3 (three) times daily as needed (irritation/dry eyes.). [name]  simethicone (MYLICON) 80 MG chewable tablet, Chew 80 mg by mouth every 6 (six) hours as needed for flatulence. .  sucralfate (CARAFATE) 1 g tablet, TAKE 1 TABLET (1 G TOTAL) BY MOUTH 4 (FOUR) TIMES DAILY - WITH MEALS AND AT BEDTIME. (Patient taking differently: Take 1 g by mouth in the morning, at noon, and at bedtime. ) .  tobramycin-dexamethasone (TOBRADEX) ophthalmic solution, Place 1 drop into both eyes daily as needed (stye).  [name]  triamcinolone cream (KENALOG) 0.1 %, Apply 1 application  topically 2 (two) times daily as needed (rash/skin irritation.).   Past Medical History:  Diagnosis Date  . Adenocarcinoma of stomach ([place]) 11/11/2015   gastric mass on egd,   . Anal fissure   . Anemia    hx 3/17 iron infusion, on aranesp and injected B12 since 11/2015.   [name] Anxiety   . Aortic root dilatation ([place])   . Arthritis    oa  . Blind left eye 2015   partial blind in left eye due to stroke   . 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 CAD (coronary artery disease)   . Cancer (HCC)    hx bladder, kidney, uterus  . Cataracts, bilateral   . Chronic kidney disease    only has one kidney rt lft rem 03 ca     dr. Risa Grill, bladder cancer 05-04-2018  . Elevated LFTs    hepatic steatosis, none recent  . Gastroparesis   . GERD (gastroesophageal reflux disease)   . Hearing loss    wears hearing aids  . History of uterine cancer 1980s   treated with hysterectomy, external radiation and radiation seed implants.   [name] HTN (hypertension)   . Hyperlipemia   . Hypothyroidism   . Iron deficiency anemia due to chronic blood loss 11/24/2015  . Neuropathy    right leg  . Osteomyelitis ([place]) as child  . PAF (paroxysmal atrial fibrillation) ([place])   . Presence of permanent cardiac pacemaker   . Radiation proctitis   . Refusal of blood transfusions as patient is Jehovah's Witness   . Restless leg syndrome   . 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 S/P TAVR (transcatheter aortic valve replacement)   . Severe aortic stenosis   . Stroke [facility]) 2015   partial stroke left legally blind; left eye   . Tachycardia-bradycardia syndrome ([place])    s/p PPM by Dr Doreatha Lew (MDT) 07/03/10  . Walker as ambulation aid   . Wears glasses   . Wears partial dentures    upper     Allergies  Allergen Reactions  . Amiodarone Other (See Comments)     Thyroid and liver and kidney problems  . Adhesive [Tape] Other  (See Comments)    Tears skin, Please use "paper" tape  . Hydrocodone Other (See Comments)    HALLLUCINATIONS  . Other Other (See Comments)    NO BLOOD PRODUCTS -  [place]ehoval Witness  . Reglan [Metoclopramide] Hives, Itching and Rash  . Remeron [Mirtazapine] Other (See Comments)  Hallucinations and make pt loopy  . Codeine Nausea And Vomiting  . Morphine And Related Nausea And Vomiting  . Requip [Ropinirole Hcl] Other (See Comments)    Headache   . Zinc Nausea Only    ROS: all negative except above.   Physical Exam: Filed Weights   11/07/19 1110  Weight: 99 lb 3.2 oz (45 kg)   BP 140/68   Pulse 71   Temp 97.7 F (36.5 C)   Wt 99 lb 3.2 oz (45 kg) Comment: REPORTED BY PATIENT  LMP  (LMP Unknown)   SpO2 98%   BMI 20.04 kg/m  General appearance: alert, no distress, WD/WN, female HEENT: normocephalic, sclerae anicteric, TMs pearly, nares patent, no discharge or erythema, pharynx normal Oral cavity: MMM, no lesions Neck: supple, no lymphadenopathy, no thyromegaly, no masses Heart: RRR, normal S1, S2, holosystolic murmur Lungs: CTA bilaterally, no wheezes, rhonchi, or rales Abdomen: +bs, soft,  non tender, non distended, no masses, no hepatomegaly, no splenomegaly Musculoskeletal: nontender, no swelling, no obvious deformity Extremities: no edema, no cyanosis, no clubbing Pulses: 2+ symmetric, upper and lower extremities, normal cap refill Neurological: alert, oriented x 3, CN2-12 intact, strength normal upper extremities and lower extremities, sensation normal throughout, DTRs 2+ throughout, no cerebellar signs, gait antalgic with walker Psychiatric: normal affect, behavior normal, pleasant   Vicie Mutters, PA-C 12:43 PM [facility]

## 2019-11-07 ENCOUNTER — Ambulatory Visit (INDEPENDENT_AMBULATORY_CARE_PROVIDER_SITE_OTHER): Payer: Medicare Other | Admitting: Physician Assistant

## 2019-11-07 ENCOUNTER — Encounter: Payer: Self-pay | Admitting: Physician Assistant

## 2019-11-07 ENCOUNTER — Other Ambulatory Visit: Payer: Self-pay

## 2019-11-07 VITALS — BP 140/68 | HR 71 | Temp 97.7°F | Wt 99.2 lb

## 2019-11-07 DIAGNOSIS — I495 Sick sinus syndrome: Secondary | ICD-10-CM

## 2019-11-07 DIAGNOSIS — I4821 Permanent atrial fibrillation: Secondary | ICD-10-CM

## 2019-11-07 DIAGNOSIS — C649 Malignant neoplasm of unspecified kidney, except renal pelvis: Secondary | ICD-10-CM

## 2019-11-07 DIAGNOSIS — I5033 Acute on chronic diastolic (congestive) heart failure: Secondary | ICD-10-CM | POA: Diagnosis not present

## 2019-11-07 DIAGNOSIS — D500 Iron deficiency anemia secondary to blood loss (chronic): Secondary | ICD-10-CM | POA: Diagnosis not present

## 2019-11-07 DIAGNOSIS — Z952 Presence of prosthetic heart valve: Secondary | ICD-10-CM

## 2019-11-07 NOTE — Patient Instructions (Signed)
Continue the lasix daily Do not want to change dosages of meds until after kidney function check  Try the losartan 25 mg AM and 25mg  PM  - may increase to 2 at night and 1 in the morning then recheck kidney function   May benefit from sleep study Try not to sleep on your back

## 2019-11-08 ENCOUNTER — Other Ambulatory Visit (HOSPITAL_COMMUNITY): Payer: Medicare Other

## 2019-11-08 ENCOUNTER — Ambulatory Visit: Payer: Medicare Other | Admitting: Physician Assistant

## 2019-11-09 DIAGNOSIS — I130 Hypertensive heart and chronic kidney disease with heart failure and stage 1 through stage 4 chronic kidney disease, or unspecified chronic kidney disease: Secondary | ICD-10-CM | POA: Diagnosis not present

## 2019-11-09 DIAGNOSIS — I272 Pulmonary hypertension, unspecified: Secondary | ICD-10-CM | POA: Diagnosis not present

## 2019-11-09 DIAGNOSIS — H6523 Chronic serous otitis media, bilateral: Secondary | ICD-10-CM | POA: Diagnosis not present

## 2019-11-09 DIAGNOSIS — G2581 Restless legs syndrome: Secondary | ICD-10-CM | POA: Diagnosis not present

## 2019-11-09 DIAGNOSIS — I495 Sick sinus syndrome: Secondary | ICD-10-CM | POA: Diagnosis not present

## 2019-11-09 DIAGNOSIS — I480 Paroxysmal atrial fibrillation: Secondary | ICD-10-CM | POA: Diagnosis not present

## 2019-11-09 DIAGNOSIS — G5791 Unspecified mononeuropathy of right lower limb: Secondary | ICD-10-CM | POA: Diagnosis not present

## 2019-11-09 DIAGNOSIS — N183 Chronic kidney disease, stage 3 unspecified: Secondary | ICD-10-CM | POA: Diagnosis not present

## 2019-11-09 DIAGNOSIS — K219 Gastro-esophageal reflux disease without esophagitis: Secondary | ICD-10-CM | POA: Diagnosis not present

## 2019-11-09 DIAGNOSIS — Z950 Presence of cardiac pacemaker: Secondary | ICD-10-CM | POA: Diagnosis not present

## 2019-11-09 DIAGNOSIS — Z5181 Encounter for therapeutic drug level monitoring: Secondary | ICD-10-CM | POA: Diagnosis not present

## 2019-11-09 DIAGNOSIS — E039 Hypothyroidism, unspecified: Secondary | ICD-10-CM | POA: Diagnosis not present

## 2019-11-09 DIAGNOSIS — Z7901 Long term (current) use of anticoagulants: Secondary | ICD-10-CM | POA: Diagnosis not present

## 2019-11-09 DIAGNOSIS — J301 Allergic rhinitis due to pollen: Secondary | ICD-10-CM | POA: Diagnosis not present

## 2019-11-09 DIAGNOSIS — K3184 Gastroparesis: Secondary | ICD-10-CM | POA: Diagnosis not present

## 2019-11-09 DIAGNOSIS — Z952 Presence of prosthetic heart valve: Secondary | ICD-10-CM | POA: Diagnosis not present

## 2019-11-09 DIAGNOSIS — I081 Rheumatic disorders of both mitral and tricuspid valves: Secondary | ICD-10-CM | POA: Diagnosis not present

## 2019-11-09 DIAGNOSIS — E559 Vitamin D deficiency, unspecified: Secondary | ICD-10-CM | POA: Diagnosis not present

## 2019-11-09 DIAGNOSIS — Z48812 Encounter for surgical aftercare following surgery on the circulatory system: Secondary | ICD-10-CM | POA: Diagnosis not present

## 2019-11-09 DIAGNOSIS — I5033 Acute on chronic diastolic (congestive) heart failure: Secondary | ICD-10-CM | POA: Diagnosis not present

## 2019-11-09 DIAGNOSIS — Z85038 Personal history of other malignant neoplasm of large intestine: Secondary | ICD-10-CM | POA: Diagnosis not present

## 2019-11-09 DIAGNOSIS — I2510 Atherosclerotic heart disease of native coronary artery without angina pectoris: Secondary | ICD-10-CM | POA: Diagnosis not present

## 2019-11-09 DIAGNOSIS — E875 Hyperkalemia: Secondary | ICD-10-CM | POA: Diagnosis not present

## 2019-11-09 DIAGNOSIS — H5462 Unqualified visual loss, left eye, normal vision right eye: Secondary | ICD-10-CM | POA: Diagnosis not present

## 2019-11-12 ENCOUNTER — Ambulatory Visit (INDEPENDENT_AMBULATORY_CARE_PROVIDER_SITE_OTHER): Payer: Medicare Other | Admitting: Internal Medicine

## 2019-11-12 DIAGNOSIS — E039 Hypothyroidism, unspecified: Secondary | ICD-10-CM | POA: Diagnosis not present

## 2019-11-12 DIAGNOSIS — Z5181 Encounter for therapeutic drug level monitoring: Secondary | ICD-10-CM | POA: Diagnosis not present

## 2019-11-12 DIAGNOSIS — I480 Paroxysmal atrial fibrillation: Secondary | ICD-10-CM | POA: Diagnosis not present

## 2019-11-12 DIAGNOSIS — I495 Sick sinus syndrome: Secondary | ICD-10-CM | POA: Diagnosis not present

## 2019-11-12 DIAGNOSIS — I130 Hypertensive heart and chronic kidney disease with heart failure and stage 1 through stage 4 chronic kidney disease, or unspecified chronic kidney disease: Secondary | ICD-10-CM | POA: Diagnosis not present

## 2019-11-12 DIAGNOSIS — H5462 Unqualified visual loss, left eye, normal vision right eye: Secondary | ICD-10-CM | POA: Diagnosis not present

## 2019-11-12 DIAGNOSIS — Z950 Presence of cardiac pacemaker: Secondary | ICD-10-CM | POA: Diagnosis not present

## 2019-11-12 DIAGNOSIS — I5033 Acute on chronic diastolic (congestive) heart failure: Secondary | ICD-10-CM | POA: Diagnosis not present

## 2019-11-12 DIAGNOSIS — E875 Hyperkalemia: Secondary | ICD-10-CM | POA: Diagnosis not present

## 2019-11-12 DIAGNOSIS — H6523 Chronic serous otitis media, bilateral: Secondary | ICD-10-CM | POA: Diagnosis not present

## 2019-11-12 DIAGNOSIS — K3184 Gastroparesis: Secondary | ICD-10-CM | POA: Diagnosis not present

## 2019-11-12 DIAGNOSIS — G5791 Unspecified mononeuropathy of right lower limb: Secondary | ICD-10-CM | POA: Diagnosis not present

## 2019-11-12 DIAGNOSIS — Z85038 Personal history of other malignant neoplasm of large intestine: Secondary | ICD-10-CM | POA: Diagnosis not present

## 2019-11-12 DIAGNOSIS — Z7901 Long term (current) use of anticoagulants: Secondary | ICD-10-CM | POA: Diagnosis not present

## 2019-11-12 DIAGNOSIS — Z952 Presence of prosthetic heart valve: Secondary | ICD-10-CM | POA: Diagnosis not present

## 2019-11-12 DIAGNOSIS — N183 Chronic kidney disease, stage 3 unspecified: Secondary | ICD-10-CM | POA: Diagnosis not present

## 2019-11-12 DIAGNOSIS — Z48812 Encounter for surgical aftercare following surgery on the circulatory system: Secondary | ICD-10-CM | POA: Diagnosis not present

## 2019-11-12 DIAGNOSIS — I081 Rheumatic disorders of both mitral and tricuspid valves: Secondary | ICD-10-CM | POA: Diagnosis not present

## 2019-11-12 DIAGNOSIS — I2510 Atherosclerotic heart disease of native coronary artery without angina pectoris: Secondary | ICD-10-CM | POA: Diagnosis not present

## 2019-11-12 DIAGNOSIS — G2581 Restless legs syndrome: Secondary | ICD-10-CM | POA: Diagnosis not present

## 2019-11-12 DIAGNOSIS — J301 Allergic rhinitis due to pollen: Secondary | ICD-10-CM | POA: Diagnosis not present

## 2019-11-12 DIAGNOSIS — I272 Pulmonary hypertension, unspecified: Secondary | ICD-10-CM | POA: Diagnosis not present

## 2019-11-12 DIAGNOSIS — E559 Vitamin D deficiency, unspecified: Secondary | ICD-10-CM | POA: Diagnosis not present

## 2019-11-12 DIAGNOSIS — K219 Gastro-esophageal reflux disease without esophagitis: Secondary | ICD-10-CM | POA: Diagnosis not present

## 2019-11-12 LAB — POCT INR: INR: 2.5 (ref 2.0–3.0)

## 2019-11-12 NOTE — Patient Instructions (Signed)
Description   Continue on same dosage of Warfarin 2mg  daily. Recheck INR in 1 week. Call us with any medication changes or concerns # [phone]. Lawerance Bach [facility] [id number]

## 2019-11-15 ENCOUNTER — Other Ambulatory Visit (HOSPITAL_COMMUNITY): Payer: Medicare Other

## 2019-11-15 ENCOUNTER — Other Ambulatory Visit: Payer: Self-pay | Admitting: Physician Assistant

## 2019-11-15 MED ORDER — AMLODIPINE BESYLATE 5 MG PO TABS: 5.0000 mg | ORAL_TABLET | Freq: Every day | ORAL | 11 refills | Status: DC

## 2019-11-21 DIAGNOSIS — G2581 Restless legs syndrome: Secondary | ICD-10-CM | POA: Diagnosis not present

## 2019-11-21 DIAGNOSIS — I480 Paroxysmal atrial fibrillation: Secondary | ICD-10-CM | POA: Diagnosis not present

## 2019-11-21 DIAGNOSIS — I5033 Acute on chronic diastolic (congestive) heart failure: Secondary | ICD-10-CM | POA: Diagnosis not present

## 2019-11-21 DIAGNOSIS — Z85038 Personal history of other malignant neoplasm of large intestine: Secondary | ICD-10-CM | POA: Diagnosis not present

## 2019-11-21 DIAGNOSIS — I081 Rheumatic disorders of both mitral and tricuspid valves: Secondary | ICD-10-CM | POA: Diagnosis not present

## 2019-11-21 DIAGNOSIS — Z7901 Long term (current) use of anticoagulants: Secondary | ICD-10-CM | POA: Diagnosis not present

## 2019-11-21 DIAGNOSIS — E039 Hypothyroidism, unspecified: Secondary | ICD-10-CM | POA: Diagnosis not present

## 2019-11-21 DIAGNOSIS — N183 Chronic kidney disease, stage 3 unspecified: Secondary | ICD-10-CM | POA: Diagnosis not present

## 2019-11-21 DIAGNOSIS — G5791 Unspecified mononeuropathy of right lower limb: Secondary | ICD-10-CM | POA: Diagnosis not present

## 2019-11-21 DIAGNOSIS — E875 Hyperkalemia: Secondary | ICD-10-CM | POA: Diagnosis not present

## 2019-11-21 DIAGNOSIS — Z48812 Encounter for surgical aftercare following surgery on the circulatory system: Secondary | ICD-10-CM | POA: Diagnosis not present

## 2019-11-21 DIAGNOSIS — Z952 Presence of prosthetic heart valve: Secondary | ICD-10-CM | POA: Diagnosis not present

## 2019-11-21 DIAGNOSIS — I2510 Atherosclerotic heart disease of native coronary artery without angina pectoris: Secondary | ICD-10-CM | POA: Diagnosis not present

## 2019-11-21 DIAGNOSIS — H6523 Chronic serous otitis media, bilateral: Secondary | ICD-10-CM | POA: Diagnosis not present

## 2019-11-21 DIAGNOSIS — J301 Allergic rhinitis due to pollen: Secondary | ICD-10-CM | POA: Diagnosis not present

## 2019-11-21 DIAGNOSIS — H5462 Unqualified visual loss, left eye, normal vision right eye: Secondary | ICD-10-CM | POA: Diagnosis not present

## 2019-11-21 DIAGNOSIS — E559 Vitamin D deficiency, unspecified: Secondary | ICD-10-CM | POA: Diagnosis not present

## 2019-11-21 DIAGNOSIS — I130 Hypertensive heart and chronic kidney disease with heart failure and stage 1 through stage 4 chronic kidney disease, or unspecified chronic kidney disease: Secondary | ICD-10-CM | POA: Diagnosis not present

## 2019-11-21 DIAGNOSIS — I495 Sick sinus syndrome: Secondary | ICD-10-CM | POA: Diagnosis not present

## 2019-11-21 DIAGNOSIS — K3184 Gastroparesis: Secondary | ICD-10-CM | POA: Diagnosis not present

## 2019-11-21 DIAGNOSIS — Z5181 Encounter for therapeutic drug level monitoring: Secondary | ICD-10-CM | POA: Diagnosis not present

## 2019-11-21 DIAGNOSIS — I272 Pulmonary hypertension, unspecified: Secondary | ICD-10-CM | POA: Diagnosis not present

## 2019-11-21 DIAGNOSIS — K219 Gastro-esophageal reflux disease without esophagitis: Secondary | ICD-10-CM | POA: Diagnosis not present

## 2019-11-21 DIAGNOSIS — Z950 Presence of cardiac pacemaker: Secondary | ICD-10-CM | POA: Diagnosis not present

## 2019-11-22 ENCOUNTER — Other Ambulatory Visit: Payer: Self-pay

## 2019-11-22 ENCOUNTER — Ambulatory Visit: Payer: Medicare Other | Admitting: Physician Assistant

## 2019-11-22 ENCOUNTER — Ambulatory Visit (INDEPENDENT_AMBULATORY_CARE_PROVIDER_SITE_OTHER): Payer: Medicare Other | Admitting: *Deleted

## 2019-11-22 ENCOUNTER — Ambulatory Visit (HOSPITAL_COMMUNITY): Payer: Medicare Other | Attending: Cardiovascular Disease

## 2019-11-22 ENCOUNTER — Encounter: Payer: Self-pay | Admitting: Physician Assistant

## 2019-11-22 ENCOUNTER — Ambulatory Visit (INDEPENDENT_AMBULATORY_CARE_PROVIDER_SITE_OTHER): Payer: Medicare Other | Admitting: Pharmacist

## 2019-11-22 VITALS — BP 134/70 | HR 72 | Ht 59.0 in | Wt 98.4 lb

## 2019-11-22 DIAGNOSIS — I495 Sick sinus syndrome: Secondary | ICD-10-CM

## 2019-11-22 DIAGNOSIS — Z952 Presence of prosthetic heart valve: Secondary | ICD-10-CM

## 2019-11-22 DIAGNOSIS — I4819 Other persistent atrial fibrillation: Secondary | ICD-10-CM | POA: Diagnosis not present

## 2019-11-22 DIAGNOSIS — Z7901 Long term (current) use of anticoagulants: Secondary | ICD-10-CM | POA: Diagnosis not present

## 2019-11-22 DIAGNOSIS — I712 Thoracic aortic aneurysm, without rupture, unspecified: Secondary | ICD-10-CM

## 2019-11-22 DIAGNOSIS — I5032 Chronic diastolic (congestive) heart failure: Secondary | ICD-10-CM | POA: Diagnosis not present

## 2019-11-22 DIAGNOSIS — I10 Essential (primary) hypertension: Secondary | ICD-10-CM

## 2019-11-22 DIAGNOSIS — I4821 Permanent atrial fibrillation: Secondary | ICD-10-CM

## 2019-11-22 DIAGNOSIS — Z950 Presence of cardiac pacemaker: Secondary | ICD-10-CM

## 2019-11-22 DIAGNOSIS — I739 Peripheral vascular disease, unspecified: Secondary | ICD-10-CM

## 2019-11-22 LAB — POCT INR: INR: 2.6 (ref 2.0–3.0)

## 2019-11-22 LAB — ECHOCARDIOGRAM COMPLETE: Height: 59 in

## 2019-11-22 NOTE — Patient Instructions (Signed)
Medication Instructions:  Your physician has recommended you make the following change in your medication:  START Amlodipine 5 mg once daily  *If you need a refill on your cardiac medications before your next appointment, please call your pharmacy*   Lab Work: Your physician recommends that you return for lab work a few days prior to your Cardioversion  If you have labs (blood work) drawn today and your tests are completely normal, you will receive your results only by: [name] MyChart Message (if you have MyChart) OR . A paper copy in the mail If you have any lab test that is abnormal or we need to change your treatment, we will call you to review the results.   Testing/Procedures: You are scheduled for a Cardioversion on_________with Dr. ______.  Please arrive at the [facility] (Main Entrance A) at [facility]: [address] [place], [place] [address] at  _____ am/pm. (1 hour prior to procedure unless lab work is needed; if lab work is needed arrive 1.5 hours ahead)  DIET: Nothing to eat or drink after midnight except a sip of water with medications (see medication instructions below)  Medication Instructions: Hold _______________  Continue your anticoagulant:  You will need to continue your anticoagulant after your procedure until you  are told by your  Provider that it is safe to stop   Labs: If patient is on Coumadin, patient needs pt/INR, CBC, BMET within 3 days (No pt/INR needed for patients taking Xarelto, Eliquis, Pradaxa) For patients receiving anesthesia for TEE and all Cardioversion patients: BMET, CBC within 1 week  Come to:  (Lab option #1) Come to the lab at [facility] between the hours of 8:00 am and 4:30 pm. You do not have to be fasting.  You must have a responsible person to drive you home and stay in the waiting area during your procedure. Failure to do so could result in cancellation.  Bring your insurance cards.  *Special Note: Every effort  is made to have your procedure done on time. Occasionally there are emergencies that occur at the hospital that may cause delays. Please be patient if a delay does occur.     Follow-Up: At [facility], you and your health needs are our priority.  As part of our continuing mission to provide you with exceptional heart care, we have created designated Provider Care Teams.  These Care Teams include your primary Cardiologist (physician) and Advanced Practice Providers (APPs -  Physician Assistants and Nurse Practitioners) who all work together to provide you with the care you need, when you need it.  We recommend signing up for the patient portal called "MyChart".  Sign up information is provided on this After Visit Summary.  MyChart is used to connect with patients for Virtual Visits (Telemedicine).  Patients are able to view lab/test results, encounter notes, upcoming appointments, etc.  Non-urgent messages can be sent to your provider as well.   To learn more about what you can do with MyChart, go to [email or web address].    Your next appointment:   2 month(s)  The format for your next appointment:   In Person  Provider:   You may see [name], MD or one of the following Advanced Practice Providers on your designated Care Team:    Almyra Deforest, PA-C  Fabian Sharp, PA-C or   Roby Lofts, [place]

## 2019-11-22 NOTE — H&P (View-Only) (Signed)
[facility] [place]                                       Cardiology Office Note    Date:  11/22/2019   ID:  [name], DOB Jul 04, 1934, MRN [id number]  PCP:  Valerie Pinto, MD  Cardiologist:  Valerie [name], MD / Dr. Angelena [name] & Dr. Roxy [name] (TAVR)  CC:  1 month s/p TAVR   History of Present Illness:  [name] is a 86 y.o. female with a history of chronic diastolic CHF, Hx of ocular stroke, persistent afib on coumadin, tachy-brady syndrome s/p PPM, HTN, HLD, gastric cancer,renal cell carcinoma s/p nephrectomy, radiation proctitis,CKD stage III,anemia, recent bladder cancer, Jehovah's witness faith,and LFLG severe aortic stenosis s/p TAVR (10/30/19) c/b moderate PVL who presents to clinic for follow up.   Patient has a longstanding history of chronic diastolic congestive heart failure and atrial fibrillation. She was followed for many years by Dr. Mare [name] and more recently by Dr. [name]. She underwent permanent pacemaker implantation in the distant past for tachybradycardia syndrome and has been on long-term warfarin anticoagulation for many years. She has developed progressive exertional shortness of breath, fatigue, and fluid retention over the past 2 years. She has required escalating doses of diuretic therapy and she has been staying in persistent atrial fibrillation. Follow-up transthoracic echocardiogram performed September 17, 2019 findings consistent with paradoxical low flow low gradient severe aortic stenosis.Ejection fraction was estimated 60 to 65%. The aortic valve was severely calcified with severely restricted leaflet motion.Peak velocity across aortic valve measured 3.3 m/s corresponding to mean transvalvular gradient estimated 25 mmHg. The DVI was notably quite low at 0.21 with aortic valve area calculated 0.60 cm and stroke-volume only 43 mL with stroke-volume index 31 mL/m. Diagnostic cardiac catheterization  performed by Dr. [name] on October 09, 2019 revealed nonobstructive coronary artery disease. Peak to peak and mean transvalvular gradients across aortic valve were measured 19 and 11.4 mmHg respectively corresponding to aortic valve area calculated only 0.84 cm.  The patient was evaluated by the multidisciplinary valve team and underwent successful TAVR with a 23 mm Edwards Sapien 3 Ultra THV via the left carotid approach on 10/30/19. Post operative echo showed EF 70% s/p TAVR with mild-mod PVL and mean gradient of 7 mm Hg. Of note, pre op labs showed a markedly elevated BNP > 2000 and some mild pulmonary congestion on CXR. There was some concern for potential valve migration. However, the patient was feeling quite well with a marked clinical improvement in her breathing and was discharged on POD 1 (10/31/19). She was discharged on coumadin alone given high risk of bleed.   Her daughter called in on Friday 11/02/19 to report a 7 lbs weight gain (99lbs --> 106lbs), acutely worsening shortness of breath, chest pressure, weight gain and orthopnea. She tried 20mg  of lasix with no improvement or increase in urine output. I asked her to increase lasix to 40mg  x 2 days. She improved dramatically with increased lasix. Weight down to 98 lbs on home scale. She was brought into the office earlier for assessment. Limited echo 11/05/19 showed stable mild-mod PVL. She sent me a mychart message with a log of her BPS which were uncontrolled. Norvasc 5mg  daily was added to her meds, but it appears she never read this message.   Today she presents to clinic for follow  up. She is still feeling just awful with continued dyspnea on exertion, intermittent chest pressure and palpitations. This has been ongoing since before her TAVR and has not gotten better. She is here with her daughter and she is just frustrated that she continues to feel poorly. She has intermittent LE edema and orthopnea. Some days seem to be better than others.  She is eager to try anything to help her get "back to her old self."  Past Medical History:  Diagnosis Date  . Adenocarcinoma of stomach ([place]) 11/11/2015   gastric mass on egd,   . Anal fissure   . Anemia    hx 3/17 iron infusion, on aranesp and injected B12 since 11/2015.   [name] Anxiety   . Aortic root dilatation ([place])   . Arthritis    oa  . Blind left eye 2015   partial blind in left eye due to stroke   . 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 CAD (coronary artery disease)   . Cancer (HCC)    hx bladder, kidney, uterus  . Cataracts, bilateral   . Chronic kidney disease    only has one kidney rt lft rem 03 ca     [name], bladder cancer 05-04-2018  . Elevated LFTs    hepatic steatosis, none recent  . Gastroparesis   . GERD (gastroesophageal reflux disease)   . Hearing loss    wears hearing aids  . History of uterine cancer 1980s   treated with hysterectomy, external radiation and radiation seed implants.   [name] HTN (hypertension)   . Hyperlipemia   . Hypothyroidism   . Iron deficiency anemia due to chronic blood loss 11/24/2015  . Neuropathy    right leg  . Osteomyelitis ([place]) as child  . PAF (paroxysmal atrial fibrillation) ([place])   . Presence of permanent cardiac pacemaker   . Radiation proctitis   . Refusal of blood transfusions as patient is Jehovah's Witness   . Restless leg syndrome   . 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 S/P TAVR (transcatheter aortic valve replacement)   . Severe aortic stenosis   . Stroke [facility]) 2015   partial stroke left legally blind; left eye   . Tachycardia-bradycardia syndrome ([place])    s/p PPM by Dr [name] (MDT) 07/03/10  . Walker as ambulation aid   . Wears glasses   . Wears partial dentures    upper    Past Surgical History:  Procedure Laterality Date  . CARDIAC CATHETERIZATION  2008  . CHOLECYSTECTOMY  1970s  . COLONOSCOPY N/A  11/11/2015   Procedure: COLONOSCOPY;  Surgeon: Valerie Mayer, MD;  [facility];  Service: Endoscopy;  Laterality: N/A;  . CYSTOSCOPY W/ RETROGRADES Right 05/01/2018   Procedure: CYSTOSCOPY WITH RIGHT RETROGRADE PYELOGRAM;  Surgeon: Lucas Mallow, MD;  [facility];  Service: Urology;  Laterality: Right;  . CYSTOSCOPY W/ RETROGRADES Right 07/21/2018   Procedure: CYSTOSCOPY WITH RIGHT RETROGRADE PYELOGRAM;  Surgeon: Alexis Frock, MD;  [facility];  Service: Urology;  Laterality: Right;  . ESOPHAGOGASTRODUODENOSCOPY N/A 11/11/2015   Procedure: ESOPHAGOGASTRODUODENOSCOPY (EGD);  Surgeon: Valerie Mayer, MD;  Location: [facility];  Service: Endoscopy;  Laterality: N/A;  . ESOPHAGOGASTRODUODENOSCOPY N/A 02/05/2016   Procedure: ESOPHAGOGASTRODUODENOSCOPY (EGD);  Surgeon: Ladene Artist, MD;  Location: [facility] END[facility]COPY;  Service: Endoscopy;  Laterality: N/A;  . ESOPHAGOGASTRODUODENOSCOPY N/A 04/02/2016   Procedure:  ESOPHAGOGASTRODUODENOSCOPY (EGD);  Surgeon: Valerie Mayer, MD;  Location: [facility];  Service: Endoscopy;  Laterality: N/A;  . ESOPHAGOGASTRODUODENOSCOPY (EGD) WITH PROPOFOL N/A 04/06/2017   Procedure: ESOPHAGOGASTRODUODENOSCOPY (EGD) WITH PROPOFOL;  Surgeon: Valerie Mayer, MD;  [facility];  Service: Endoscopy;  Laterality: N/A;  . EYE SURGERY Bilateral    remove cataracts  . GASTRECTOMY N/A 01/15/2016   Procedure: OPEN PARTIAL GASTRECTOMY;  Surgeon: Stark Klein, MD;  Location: [place];  Service: General;  Laterality: N/A;  . GASTRECTOMY Left 02/24/2016   Procedure: OPEN J TUBE;  Surgeon: Stark Klein, MD;  Location: [place];  Service: General;  Laterality: Left;  [name] GASTROJEJUNOSTOMY N/A 05/18/2016   Procedure: ROUX EN [place];  Surgeon: Stark Klein, MD;  Location: [place];  Service: General;  Laterality: N/A;  . I & D KNEE WITH POLY EXCHANGE Right 12/17/2013   Procedure: IRRIGATION AND DEBRIDEMEN RIGHT TOTAL  KNEE WITH POLY EXCHANGE;  Surgeon:  Mauri Pole, MD;  [facility];  Service: Orthopedics;  Laterality: Right;  . IR GENERIC HISTORICAL  06/17/2016   IR RADIOLOGIST EVAL & MGMT 06/17/2016 Ascencion Dike, PA-C [facility] INTERV RAD  . LAPAROSCOPY N/A 01/15/2016   Procedure: LAPAROSCOPY DIAGNOSTIC;  Surgeon: Stark Klein, MD;  Location: [place];  Service: General;  Laterality: N/A;  . LYSIS OF ADHESION  05/18/2016   Procedure: LYSIS OF ADHESION;  Surgeon: Stark Klein, MD;  Location: [place];  Service: General;;  . NEPHRECTOMY Left    renal cell cancer  . PACEMAKER INSERTION  07/04/2011   MDT PPM implanted by Dr [name] for tachy/brady; managed by Dr Josely Moffat Grayer   . PATELLAR TENDON REPAIR Right 03/06/2014   Procedure: AVULSION WITH PATELLA TENDON REPAIR;  Surgeon: Mauri Pole, MD;  [facility];  Service: Orthopedics;  Laterality: Right;  . PATENT DUCTUS ARTERIOUS REPAIR  ~ 1949  . RIGHT/LEFT HEART CATH AND CORONARY ANGIOGRAPHY N/A 10/09/2019   Procedure: RIGHT/LEFT HEART CATH AND CORONARY ANGIOGRAPHY;  Surgeon: [name], Valerie M, MD;  Location: [place] [facility];  Service: Cardiovascular;  Laterality: N/A;  . TEE WITHOUT CARDIOVERSION N/A 10/30/2019   Procedure: TRANSESOPHAGEAL ECHOCARDIOGRAM (TEE);  Surgeon: Burnell Blanks, MD;  Location: [place];  Service: Open Heart Surgery;  Laterality: N/A;  . TOTAL ABDOMINAL HYSTERECTOMY  85  . TOTAL HIP REVISION Right 2009   initial replacment surg  . TOTAL KNEE ARTHROPLASTY  02/07/2012   Procedure: TOTAL KNEE ARTHROPLASTY;  Surgeon: Mauri Pole, MD;  [facility];  Service: Orthopedics;  Laterality: Right;  . TOTAL KNEE REVISION Right 07/29/2014   Procedure: RIGHT KNEE REVISION OF PREVIOUS REPAIR EXTENSOR MECHANISM;  Surgeon: Mauri Pole, MD;  [facility];  Service: Orthopedics;  Laterality: Right;  . TRANSCATHETER AORTIC VALVE REPLACEMENT, CAROTID Left 10/30/2019   TRANSCATHETER AORTIC VALVE REPLACEMENT, LEFT CAROTID (Left Neck)   . TRANSCATHETER AORTIC VALVE  REPLACEMENT, CAROTID Left 10/30/2019   Procedure: TRANSCATHETER AORTIC VALVE REPLACEMENT, LEFT CAROTID;  Surgeon: Burnell Blanks, MD;  Location: [place];  Service: Open Heart Surgery;  Laterality: Left;  . TRANSURETHRAL RESECTION OF BLADDER TUMOR N/A 07/21/2018   Procedure: TRANSURETHRAL RESECTION OF BLADDER TUMOR (TURBT);  Surgeon: Alexis Frock, MD;  [facility];  Service: Urology;  Laterality: N/A;  . TRANSURETHRAL RESECTION OF BLADDER TUMOR WITH MITOMYCIN-C N/A 05/01/2018   Procedure: TRANSURETHRAL RESECTION OF BLADDER TUMOR WITH POST OP INSTILLATION OF GEMCITABINE;  Surgeon: Lucas Mallow, MD;  [facility];  Service:  Urology;  Laterality: N/A;  . WHIPPLE PROCEDURE N/A 05/18/2016   Procedure: EXPLORATORY LAPAROTOMY;  Surgeon: Stark Klein, MD;  Location: [place] [facility];  Service: General;  Laterality: N/A;    Current Medications: Outpatient Medications Prior to Visit  Medication Sig Dispense Refill  . acetaminophen (TYLENOL) 500 MG tablet Take 500-1,000 mg by mouth every 6 (six) hours as needed for mild pain or headache.     . ALPRAZolam (XANAX) 0.5 MG tablet Take 0.5 mg by mouth 2 (two) times daily as needed for anxiety.     . Alum & Mag Hydroxide-Simeth (MYLANTA PO) Take 20 mLs by mouth every evening.     [name] amLODipine (NORVASC) 5 MG tablet Take 1 tablet (5 mg total) by mouth daily. 30 tablet 11  . Ascorbic Acid (VITAMIN C) 1000 MG tablet Take 1,000 mg by mouth daily.    . Biotin 10000 MCG TBDP Take 10,000 mcg by mouth daily.    . Cholecalciferol (VITAMIN D-3) 5000 units TABS Take 5,000 Units by mouth 3 (three) times daily.     [name] erythromycin ophthalmic ointment Place 1 application into both eyes at bedtime as needed (stye).     . famotidine (PEPCID) 40 MG tablet Take 1 tablet Daily for Indigestion & Heartburn 90 tablet 3  . ferrous sulfate 325 (65 FE) MG tablet Take 325 mg by mouth daily in the afternoon.    . furosemide (LASIX) 20 MG tablet Take 1 tablet (20 mg total) by  mouth daily. 90 tablet 3  . gabapentin (NEURONTIN) 300 MG capsule Take 300 mg by mouth 2 (two) times daily.    [name] levothyroxine (SYNTHROID) 100 MCG tablet Take 50 mcg by mouth daily before breakfast.    . loperamide (IMODIUM A-D) 2 MG tablet Take up to 12 tablets /day as needed for  for Diarrhea 120 tablet 11  . loratadine (CLARITIN) 10 MG tablet Take 10 mg by mouth at bedtime.     [name] losartan (COZAAR) 25 MG tablet Take 2 tablets (50 mg total) by mouth daily. 360 tablet 1  . metoprolol tartrate (LOPRESSOR) 100 MG tablet Take 100 mg by mouth 2 (two) times daily.    . nitroGLYCERIN (NITROSTAT) 0.4 MG SL tablet Dissolve 1 tablet under tongue every 5 minutes if needed for Angina (Patient taking differently: Place 0.4 mg under the tongue every 5 (five) minutes as needed for chest pain. ) 50 tablet prn  . ondansetron (ZOFRAN-ODT) 8 MG disintegrating tablet Take 8 mg by mouth every 8 (eight) hours as needed for nausea or vomiting.     . potassium chloride (KLOR-CON) 10 MEQ tablet Take 1 tablet (10 mEq total) by mouth daily. 90 tablet 3  . Propylene Glycol (SYSTANE COMPLETE) 0.6 % SOLN Place 1 drop into both eyes 3 (three) times daily as needed (irritation/dry eyes.).    [name] simethicone (MYLICON) 80 MG chewable tablet Chew 80 mg by mouth every 6 (six) hours as needed for flatulence.    . tobramycin-dexamethasone (TOBRADEX) ophthalmic solution Place 1 drop into both eyes daily as needed (stye).     [name] triamcinolone cream (KENALOG) 0.1 % Apply 1 application topically 2 (two) times daily as needed (rash/skin irritation.).     [name] verapamil (CALAN-SR) 120 MG CR tablet Take 1 tablet 2 x /day with a meal for BP & Heart Rhythm (Patient taking differently: Take 120 mg by mouth 2 (two) times daily. ) 180 tablet 3  . warfarin (COUMADIN) 2 MG tablet TAKE 1 TO 2 TABLETS AS  DIRECTED TO PREVENT BLOOD CLOTS (Patient taking differently: Take 2 mg by mouth every evening. ) 180 tablet 1  . gabapentin (NEURONTIN) 300 MG capsule TAKE  1 CAPSULE BY MOUTH THREE TIMES A DAY (Patient not taking: No sig reported) 270 capsule 1  . levothyroxine (SYNTHROID) 100 MCG tablet Take 1/2 tablet daily for thyroid. (Patient not taking: Reported on 11/22/2019) 45 tablet 1  . metoprolol tartrate (LOPRESSOR) 100 MG tablet Take 1 tablet 2 x /day for BP (Patient not taking: Reported on 11/22/2019) 30 tablet 6  . sucralfate (CARAFATE) 1 g tablet TAKE 1 TABLET (1 G TOTAL) BY MOUTH 4 (FOUR) TIMES DAILY - WITH MEALS AND AT BEDTIME. (Patient taking differently: Take 1 g by mouth in the morning, at noon, and at bedtime. ) 360 tablet 3   No facility-administered medications prior to visit.     Allergies:   Amiodarone, Adhesive [tape], Hydrocodone, Other, Reglan [metoclopramide], Remeron [mirtazapine], Codeine, Morphine and related, Requip [ropinirole hcl], and Zinc   Social History   Socioeconomic History  . Marital status: Widowed    Spouse name: Not on file  . Number of children: 3  . Years of education: Not on file  . Highest education level: Not on file  Occupational History  . Not on file  Tobacco Use  . Smoking status: Never Smoker  . Smokeless tobacco: Never Used  Substance and Sexual Activity  . Alcohol use: No  . Drug use: No  . Sexual activity: Never    Comment: Hysterectomy  Other Topics Concern  . Not on file  Social History Narrative   01/01/19 Lives in [place] w/dgtr, [name].  Continues to work.   Social Determinants of Health   Financial Resource Strain:   . Difficulty of Paying Living Expenses:   Food Insecurity:   . Worried About Charity fundraiser in the Last Year:   . Arboriculturist in the Last Year:   Transportation Needs:   . Film/video editor (Medical):   [name] Lack of Transportation (Non-Medical):   Physical Activity:   . Days of Exercise per Week:   . Minutes of Exercise per Session:   Stress:   . Feeling of Stress :   Social Connections:   . Frequency of Communication with Friends and Family:    . Frequency of Social Gatherings with Friends and Family:   . Attends Religious Services:   . Active Member of Clubs or Organizations:   . Attends Archivist Meetings:   [name] Marital Status:      Family History:  The patient'sfamily history includes Colon cancer (age of onset: 25) in her mother; Heart attack in her father; Heart disease in her father, maternal grandmother, and mother.     ROS:   Please see the history of present illness.    ROS All other systems reviewed and are negative.   PHYSICAL EXAM:   VS:  BP 134/70   Pulse 72   Ht 4\' 11"  (1.499 m)   Wt 98 lb 6.4 oz (44.6 kg)   LMP  (LMP Unknown)   SpO2 96%   BMI 19.87 kg/m    GEN: Well nourished, well developed, in no acute distress HEENT: normal Neck: no JVD or masses Cardiac: irrieg irreg; 2/6 diastolic murmur heart best at RUSB. No rubs, or gallops.  Respiratory:  clear to auscultation bilaterally, normal work of breathing GI: soft, nontender, nondistended, + BS MS: no deformity or atrophy Skin: warm and dry,  no rash Neuro:  Alert and Oriented x 3, Strength and sensation are intact Psych: euthymic mood, full affect   Wt Readings from Last 3 Encounters:  11/22/19 98 lb 6.4 oz (44.6 kg)  11/07/19 99 lb 3.2 oz (45 kg)  11/05/19 100 lb 12.8 oz (45.7 kg)      Studies/Labs Reviewed:   EKG:  EKG is NOT ordered today.   Recent Labs: 03/02/2019: NT-Pro BNP 10,870 08/08/2019: TSH 2.23 10/26/2019: ALT 26; B Natriuretic Peptide 2,138.2 10/31/2019: Hemoglobin 12.4; Magnesium 1.6; Platelets 141 11/05/2019: BUN 19; Creatinine, Ser 0.91; Potassium 4.1; Sodium 143   Lipid Panel    Component Value Date/Time   CHOL 113 08/08/2019 1030   TRIG 100 08/08/2019 1030   HDL 40 (L) 08/08/2019 1030   CHOLHDL 2.8 08/08/2019 1030   VLDL 29 03/23/2017 1445   LDLCALC 54 08/08/2019 1030    Additional studies/ records that were reviewed today include:  TAVR OPERATIVE NOTE   Date of  Procedure:10/30/2019  Preoperative Diagnosis:Severe Aortic Stenosis   Postoperative Diagnosis:Same   Procedure:   Transcatheter Aortic Valve Replacement - OpenLeftTranscarotid Approach Edwards Sapien 3 THV (size 68mm, model # 9600TFX, serial # [id number])  Co-Surgeons:Clarence H. Valerie Manns, MD and [name], MD  Anesthesiologist:Chris Ermalene Postin, MD  Dala Dock, MD  Pre-operative Echo Findings: ? Paradoxical Low Flow with Normal EFSevere aortic stenosis ? Normalleft ventricular systolic function  Post-operative Echo Findings: ? Mildparavalvular leak ? Normalleft ventricular systolic function  _____________   Echo 10/31/19: IMPRESSIONS 1. Left ventricular ejection fraction, by estimation, is 70 to 75%. The  left ventricle has hyperdynamic function. The left ventricle has no  regional wall motion abnormalities. There is moderate concentric left  ventricular hypertrophy. Left ventricular  diastolic function could not be evaluated.  2. Right ventricular systolic function is normal. The right ventricular  size is normal. There is moderately elevated pulmonary artery systolic  pressure. The estimated right ventricular systolic pressure is [id number] mmHg.  3. Left atrial size was mildly dilated.  4. Right atrial size was mildly dilated.  5. Large pleural effusion in the left lateral region.  6. The mitral valve is normal in structure and function. Moderate mitral  valve regurgitation. No evidence of mitral stenosis.  7. There is mild paravalvular leak (at 5'clock on short axis), most  prominent on images 82, 83 where it appears mild to moderate.   Normal peak/mean transaortic gradients 15/7 mmHg.. The aortic valve  has been repaired/replaced. Aortic valve regurgitation is not visualized.  No aortic stenosis is present. Echo  findings are consistent with  perivalvular leak of the aortic prosthesis.  Aortic valve mean gradient measures 7.0 mmHg.  8. The inferior vena cava is normal in size with greater than 50%  respiratory variability, suggesting right atrial pressure of 3 mmHg.   ___________________  Limited echo 11/05/19: IMPRESSIONS 1. Left ventricular ejection fraction, by estimation, is 60 to 65%. The  left ventricle has normal function. The left ventricle has no regional  wall motion abnormalities. There is moderate left ventricular hypertrophy.  Left ventricular diastolic  parameters are indeterminate.  2. Moderate pleural effusion in the left lateral region.  3. The mitral valve is degenerative. Moderate mitral annular  calcifications. Moderate mitral valve regurgitation.  4. Aortic dilatation noted. There is dilatation of the ascending aorta  measuring 40 mm.  5. Tricuspid valve regurgitation is mild to moderate.  6. Right ventricular systolic function is mildly reduced. The right  ventricular size is normal. There is moderately elevated  pulmonary artery  systolic pressure. The estimated right ventricular systolic pressure is  123456 mmHg.  7. The inferior vena cava is normal in size with <50% respiratory  variability, suggesting right atrial pressure of 8 mmHg.  8. There is a 23 mm Edwards Sapien prosthetic (TAVR) valve present in the  aortic position. Procedure Date: 10/30/19. Mean gradient 21mmHg. Echo  findings are consistent with perivalvular leak of the aortic prosthesis.  Paravalvular leak appears  mild-moderate, unchanged from prior TTE on 2/24.   Comparison(s): 10/31/19 EF 70-75%. PA 27mmHg. AV 34mmHg mean, 35mmhg peak. Mild-moderate perivalvular AI.   ___________________  Echo 11/22/19 IMPRESSIONS  1. Left ventricular ejection fraction, by estimation, is 55 to 60%. The  left ventricle has normal function. The left ventricle has no regional  wall motion abnormalities. There is  mild concentric left ventricular  hypertrophy. Left ventricular diastolic  parameters are indeterminate. Elevated left ventricular end-diastolic  pressure.  2. Right ventricular systolic function is normal. The right ventricular  size is normal. There is mildly elevated pulmonary artery systolic  pressure.  3. Left atrial size was severely dilated.  4. Right atrial size was severely dilated.  5. The mitral valve is normal in structure. Mild mitral valve  regurgitation. No evidence of mitral stenosis.  6. Mild to moderate perivalvular aortic regurgitation unchanged from  prior. The aortic valve has been repaired/replaced. Aortic valve  regurgitation is mild to moderate. No aortic stenosis is present. There is  a 23 mm Edwards Sapien prosthetic (TAVR)  valve present in the aortic position. Procedure Date: 10/30/19. Aortic  regurgitation PHT measures 358 msec. Aortic valve mean gradient measures  10.0 mmHg. Aortic valve Vmax measures 2.11 m/s.  7. Aortic dilatation noted. There is mild dilatation of the ascending  aorta.  8. The inferior vena cava is normal in size with greater than 50%  respiratory variability, suggesting right atrial pressure of 3 mmHg.   ASSESSMENT & PLAN:   Severe AS s/p TAVR: patient continues to feel poorly with NYHA class III symptoms. She complains of fatigue and persistent exertional dyspnea. Echo today shows EF 55%, normally functioning TAVR with mean gradient of 10 mm Hg and stable mild-mod PVL. She will continue on long term coumadin. I will have Dr. Roxy [name] and Dr. Angelena [name] review her echo.  Chronic diastolic CHF: she appears euvolemic on exam. Weight is stable around 98 lbs. Impedance on device is stable. Continue on Lasix 20mg  daily.   Persistent atrial fibrillation: I wonder if this could be contributing to her continue dyspnea, fatigue and palpations. I had her device interrogated today which shows she has been in persistent afib since at least  December. Echo on POD1 showed mildly dilated LA, but echo today reports severe LA dilation. She has previous issues with amiodarone (listed thyroid, liver and kidney issues). She would like to see if DCCV back into sinus rhythm would help her symptoms. She will need to have at least 4 therapeutic INRs before we can get this scheduled. INR 2.5 on 3/8 and 2.6 today. Home health will check again next week. I worry about her ability to maintain sinus long term- I will reach out to Dr. Rayann Heman to see if he has any suggestions.   HTN: BP has been labile. Okay today. Pt's daughter sent me a log through MyChart with mostly uncontrolled BPs. I asked her to start amlodipine which they never started. She will pick this up from the pharmacy today  TAA: pre TAVR CT showed stable ectatic  4.0 cm ascending thoracic aorta. Recommend annual imaging followup by CTA or MRA. Will follow over time.  PAD: pre TAVR CT showed severe aortic and iliofemoral atherosclerosis with multifocal high-grade stenoses in the right external iliac artery. She does get some exertional leg pain, although difficult to tease out given concurrently peripheral neuropathy. Will get her into one of our [facility] doctors.   Total time spent with patient was over 40 minutes which included evaluating patient, reviewing record and coordinating care. Face to face time >50%.    Medication Adjustments/Labs and Tests Ordered: Current medicines are reviewed at length with the patient today.  Concerns regarding medicines are outlined above.  Medication changes, Labs and Tests ordered today are listed in the Patient Instructions below. There are no Patient Instructions on file for this visit.   Signed, Valerie Form, PA-C  11/22/2019 4:23 PM    [facility] [address], [place], [place]  [address] Phone: [phone]; Fax: [phone]

## 2019-11-22 NOTE — Progress Notes (Signed)
[facility] [place]                                       Cardiology Office Note    Date:  11/22/2019   ID:  [name], DOB 12-27-1933, MRN [id number]  PCP:  [name], MD  Cardiologist:  [name], MD / Dr. Angelena Form & Dr. Roxy Manns (TAVR)  CC:  1 month s/p TAVR   History of Present Illness:  [name] is a 86 y.o. female with a history of chronic diastolic CHF, Hx of ocular stroke, persistent afib on coumadin, tachy-brady syndrome s/p PPM, HTN, HLD, gastric cancer,renal cell carcinoma s/p nephrectomy, radiation proctitis,CKD stage III,anemia, recent bladder cancer, Jehovah's witness faith,and LFLG severe aortic stenosis s/p TAVR (10/30/19) c/b moderate PVL who presents to clinic for follow up.   Patient has a longstanding history of chronic diastolic congestive heart failure and atrial fibrillation. She was followed for many years by Dr. Mare Ferrari and more recently by Dr. [name]. She underwent permanent pacemaker implantation in the distant past for tachybradycardia syndrome and has been on long-term warfarin anticoagulation for many years. She has developed progressive exertional shortness of breath, fatigue, and fluid retention over the past 2 years. She has required escalating doses of diuretic therapy and she has been staying in persistent atrial fibrillation. Follow-up transthoracic echocardiogram performed September 17, 2019 findings consistent with paradoxical low flow low gradient severe aortic stenosis.Ejection fraction was estimated 60 to 65%. The aortic valve was severely calcified with severely restricted leaflet motion.Peak velocity across aortic valve measured 3.3 m/s corresponding to mean transvalvular gradient estimated 25 mmHg. The DVI was notably quite low at 0.21 with aortic valve area calculated 0.60 cm and stroke-volume only 43 mL with stroke-volume index 31 mL/m. Diagnostic cardiac catheterization  performed by Dr. [name] on October 09, 2019 revealed nonobstructive coronary artery disease. Peak to peak and mean transvalvular gradients across aortic valve were measured 19 and 11.4 mmHg respectively corresponding to aortic valve area calculated only 0.84 cm.  The patient was evaluated by the multidisciplinary valve team and underwent successful TAVR with a 23 mm Edwards Sapien 3 Ultra THV via the left carotid approach on 10/30/19. Post operative echo showed EF 70% s/p TAVR with mild-mod PVL and mean gradient of 7 mm Hg. Of note, pre op labs showed a markedly elevated BNP > 2000 and some mild pulmonary congestion on CXR. There was some concern for potential valve migration. However, the patient was feeling quite well with a marked clinical improvement in her breathing and was discharged on POD 1 (10/31/19). She was discharged on coumadin alone given high risk of bleed.   Her daughter called in on Friday 11/02/19 to report a 7 lbs weight gain (99lbs --> 106lbs), acutely worsening shortness of breath, chest pressure, weight gain and orthopnea. She tried 20mg  of lasix with no improvement or increase in urine output. I asked her to increase lasix to 40mg  x 2 days. She improved dramatically with increased lasix. Weight down to 98 lbs on home scale. She was brought into the office earlier for assessment. Limited echo 11/05/19 showed stable mild-mod PVL. She sent me a mychart message with a log of her BPS which were uncontrolled. Norvasc 5mg  daily was added to her meds, but it appears she never read this message.   Today she presents to clinic for follow  up. She is still feeling just awful with continued dyspnea on exertion, intermittent chest pressure and palpitations. This has been ongoing since before her TAVR and has not gotten better. She is here with her daughter and she is just frustrated that she continues to feel poorly. She has intermittent LE edema and orthopnea. Some days seem to be better than others.  She is eager to try anything to help her get "back to her old self."  Past Medical History:  Diagnosis Date  . Adenocarcinoma of stomach ([place]) 11/11/2015   gastric mass on egd,   . Anal fissure   . Anemia    hx 3/17 iron infusion, on aranesp and injected B12 since 11/2015.   [name] Anxiety   . Aortic root dilatation ([place])   . Arthritis    oa  . Blind left eye 2015   partial blind in left eye due to stroke   . 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 CAD (coronary artery disease)   . Cancer (HCC)    hx bladder, kidney, uterus  . Cataracts, bilateral   . Chronic kidney disease    only has one kidney rt lft rem 03 ca     dr. Risa Grill, bladder cancer 05-04-2018  . Elevated LFTs    hepatic steatosis, none recent  . Gastroparesis   . GERD (gastroesophageal reflux disease)   . Hearing loss    wears hearing aids  . History of uterine cancer 1980s   treated with hysterectomy, external radiation and radiation seed implants.   [name] HTN (hypertension)   . Hyperlipemia   . Hypothyroidism   . Iron deficiency anemia due to chronic blood loss 11/24/2015  . Neuropathy    right leg  . Osteomyelitis ([place]) as child  . PAF (paroxysmal atrial fibrillation) ([place])   . Presence of permanent cardiac pacemaker   . Radiation proctitis   . Refusal of blood transfusions as patient is Jehovah's Witness   . Restless leg syndrome   . 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 S/P TAVR (transcatheter aortic valve replacement)   . Severe aortic stenosis   . Stroke [facility]) 2015   partial stroke left legally blind; left eye   . Tachycardia-bradycardia syndrome ([place])    s/p PPM by Dr Doreatha Lew (MDT) 07/03/10  . Walker as ambulation aid   . Wears glasses   . Wears partial dentures    upper    Past Surgical History:  Procedure Laterality Date  . CARDIAC CATHETERIZATION  2008  . CHOLECYSTECTOMY  1970s  . COLONOSCOPY N/A  11/11/2015   Procedure: COLONOSCOPY;  Surgeon: [name], MD;  Location: [facility];  Service: Endoscopy;  Laterality: N/A;  . CYSTOSCOPY W/ RETROGRADES Right 05/01/2018   Procedure: CYSTOSCOPY WITH RIGHT RETROGRADE PYELOGRAM;  Surgeon: Lucas Mallow, MD;  Location: [facility];  Service: Urology;  Laterality: Right;  . CYSTOSCOPY W/ RETROGRADES Right 07/21/2018   Procedure: CYSTOSCOPY WITH RIGHT RETROGRADE PYELOGRAM;  Surgeon: Alexis Frock, MD;  [facility];  Service: Urology;  Laterality: Right;  . ESOPHAGOGASTRODUODENOSCOPY N/A 11/11/2015   Procedure: ESOPHAGOGASTRODUODENOSCOPY (EGD);  Surgeon: [name], MD;  Location: [facility];  Service: Endoscopy;  Laterality: N/A;  . ESOPHAGOGASTRODUODENOSCOPY N/A 02/05/2016   Procedure: ESOPHAGOGASTRODUODENOSCOPY (EGD);  Surgeon: Ladene Artist, MD;  [facility];  Service: Endoscopy;  Laterality: N/A;  . ESOPHAGOGASTRODUODENOSCOPY N/A 04/02/2016   Procedure:  ESOPHAGOGASTRODUODENOSCOPY (EGD);  Surgeon: [name], MD;  Location: [facility];  Service: Endoscopy;  Laterality: N/A;  . ESOPHAGOGASTRODUODENOSCOPY (EGD) WITH PROPOFOL N/A 04/06/2017   Procedure: ESOPHAGOGASTRODUODENOSCOPY (EGD) WITH PROPOFOL;  Surgeon: [name], MD;  [facility];  Service: Endoscopy;  Laterality: N/A;  . EYE SURGERY Bilateral    remove cataracts  . GASTRECTOMY N/A 01/15/2016   Procedure: OPEN PARTIAL GASTRECTOMY;  Surgeon: Stark Klein, MD;  Location: [place];  Service: General;  Laterality: N/A;  . GASTRECTOMY Left 02/24/2016   Procedure: OPEN J TUBE;  Surgeon: Stark Klein, MD;  Location: [place];  Service: General;  Laterality: Left;  [name] GASTROJEJUNOSTOMY N/A 05/18/2016   Procedure: ROUX EN [place];  Surgeon: Stark Klein, MD;  Location: [place];  Service: General;  Laterality: N/A;  . I & D KNEE WITH POLY EXCHANGE Right 12/17/2013   Procedure: IRRIGATION AND DEBRIDEMEN RIGHT TOTAL  KNEE WITH POLY EXCHANGE;  Surgeon:  Mauri Pole, MD;  [facility];  Service: Orthopedics;  Laterality: Right;  . IR GENERIC HISTORICAL  06/17/2016   IR RADIOLOGIST EVAL & MGMT 06/17/2016 Ascencion Dike, PA-C [facility] INTERV RAD  . LAPAROSCOPY N/A 01/15/2016   Procedure: LAPAROSCOPY DIAGNOSTIC;  Surgeon: Stark Klein, MD;  Location: [place];  Service: General;  Laterality: N/A;  . LYSIS OF ADHESION  05/18/2016   Procedure: LYSIS OF ADHESION;  Surgeon: Stark Klein, MD;  Location: [place];  Service: General;;  . NEPHRECTOMY Left    renal cell cancer  . PACEMAKER INSERTION  07/04/2011   MDT PPM implanted by Dr Doreatha Lew for tachy/brady; managed by Dr Garyn Waguespack Grayer   . PATELLAR TENDON REPAIR Right 03/06/2014   Procedure: AVULSION WITH PATELLA TENDON REPAIR;  Surgeon: Mauri Pole, MD;  [facility];  Service: Orthopedics;  Laterality: Right;  . PATENT DUCTUS ARTERIOUS REPAIR  ~ 1949  . RIGHT/LEFT HEART CATH AND CORONARY ANGIOGRAPHY N/A 10/09/2019   Procedure: RIGHT/LEFT HEART CATH AND CORONARY ANGIOGRAPHY;  Surgeon: [name], [name], MD;  Location: [facility];  Service: Cardiovascular;  Laterality: N/A;  . TEE WITHOUT CARDIOVERSION N/A 10/30/2019   Procedure: TRANSESOPHAGEAL ECHOCARDIOGRAM (TEE);  Surgeon: Burnell Blanks, MD;  Location: [place];  Service: Open Heart Surgery;  Laterality: N/A;  . TOTAL ABDOMINAL HYSTERECTOMY  85  . TOTAL HIP REVISION Right 2009   initial replacment surg  . TOTAL KNEE ARTHROPLASTY  02/07/2012   Procedure: TOTAL KNEE ARTHROPLASTY;  Surgeon: Mauri Pole, MD;  [facility];  Service: Orthopedics;  Laterality: Right;  . TOTAL KNEE REVISION Right 07/29/2014   Procedure: RIGHT KNEE REVISION OF PREVIOUS REPAIR EXTENSOR MECHANISM;  Surgeon: Mauri Pole, MD;  [facility];  Service: Orthopedics;  Laterality: Right;  . TRANSCATHETER AORTIC VALVE REPLACEMENT, CAROTID Left 10/30/2019   TRANSCATHETER AORTIC VALVE REPLACEMENT, LEFT CAROTID (Left Neck)   . TRANSCATHETER AORTIC VALVE  REPLACEMENT, CAROTID Left 10/30/2019   Procedure: TRANSCATHETER AORTIC VALVE REPLACEMENT, LEFT CAROTID;  Surgeon: Burnell Blanks, MD;  Location: [place];  Service: Open Heart Surgery;  Laterality: Left;  . TRANSURETHRAL RESECTION OF BLADDER TUMOR N/A 07/21/2018   Procedure: TRANSURETHRAL RESECTION OF BLADDER TUMOR (TURBT);  Surgeon: Alexis Frock, MD;  [facility];  Service: Urology;  Laterality: N/A;  . TRANSURETHRAL RESECTION OF BLADDER TUMOR WITH MITOMYCIN-C N/A 05/01/2018   Procedure: TRANSURETHRAL RESECTION OF BLADDER TUMOR WITH POST OP INSTILLATION OF GEMCITABINE;  Surgeon: Lucas Mallow, MD;  [facility];  Service:  Urology;  Laterality: N/A;  . WHIPPLE PROCEDURE N/A 05/18/2016   Procedure: EXPLORATORY LAPAROTOMY;  Surgeon: Stark Klein, MD;  Location: [place] [facility];  Service: General;  Laterality: N/A;    Current Medications: Outpatient Medications Prior to Visit  Medication Sig Dispense Refill  . acetaminophen (TYLENOL) 500 MG tablet Take 500-1,000 mg by mouth every 6 (six) hours as needed for mild pain or headache.     . ALPRAZolam (XANAX) 0.5 MG tablet Take 0.5 mg by mouth 2 (two) times daily as needed for anxiety.     . Alum & Mag Hydroxide-Simeth (MYLANTA PO) Take 20 mLs by mouth every evening.     [name] amLODipine (NORVASC) 5 MG tablet Take 1 tablet (5 mg total) by mouth daily. 30 tablet 11  . Ascorbic Acid (VITAMIN C) 1000 MG tablet Take 1,000 mg by mouth daily.    . Biotin 10000 MCG TBDP Take 10,000 mcg by mouth daily.    . Cholecalciferol (VITAMIN D-3) 5000 units TABS Take 5,000 Units by mouth 3 (three) times daily.     [name] erythromycin ophthalmic ointment Place 1 application into both eyes at bedtime as needed (stye).     . famotidine (PEPCID) 40 MG tablet Take 1 tablet Daily for Indigestion & Heartburn 90 tablet 3  . ferrous sulfate 325 (65 FE) MG tablet Take 325 mg by mouth daily in the afternoon.    . furosemide (LASIX) 20 MG tablet Take 1 tablet (20 mg total) by  mouth daily. 90 tablet 3  . gabapentin (NEURONTIN) 300 MG capsule Take 300 mg by mouth 2 (two) times daily.    [name] levothyroxine (SYNTHROID) 100 MCG tablet Take 50 mcg by mouth daily before breakfast.    . loperamide (IMODIUM A-D) 2 MG tablet Take up to 12 tablets /day as needed for  for Diarrhea 120 tablet 11  . loratadine (CLARITIN) 10 MG tablet Take 10 mg by mouth at bedtime.     [name] losartan (COZAAR) 25 MG tablet Take 2 tablets (50 mg total) by mouth daily. 360 tablet 1  . metoprolol tartrate (LOPRESSOR) 100 MG tablet Take 100 mg by mouth 2 (two) times daily.    . nitroGLYCERIN (NITROSTAT) 0.4 MG SL tablet Dissolve 1 tablet under tongue every 5 minutes if needed for Angina (Patient taking differently: Place 0.4 mg under the tongue every 5 (five) minutes as needed for chest pain. ) 50 tablet prn  . ondansetron (ZOFRAN-ODT) 8 MG disintegrating tablet Take 8 mg by mouth every 8 (eight) hours as needed for nausea or vomiting.     . potassium chloride (KLOR-CON) 10 MEQ tablet Take 1 tablet (10 mEq total) by mouth daily. 90 tablet 3  . Propylene Glycol (SYSTANE COMPLETE) 0.6 % SOLN Place 1 drop into both eyes 3 (three) times daily as needed (irritation/dry eyes.).    [name] simethicone (MYLICON) 80 MG chewable tablet Chew 80 mg by mouth every 6 (six) hours as needed for flatulence.    . tobramycin-dexamethasone (TOBRADEX) ophthalmic solution Place 1 drop into both eyes daily as needed (stye).     [name] triamcinolone cream (KENALOG) 0.1 % Apply 1 application topically 2 (two) times daily as needed (rash/skin irritation.).     [name] verapamil (CALAN-SR) 120 MG CR tablet Take 1 tablet 2 x /day with a meal for BP & Heart Rhythm (Patient taking differently: Take 120 mg by mouth 2 (two) times daily. ) 180 tablet 3  . warfarin (COUMADIN) 2 MG tablet TAKE 1 TO 2 TABLETS AS  DIRECTED TO PREVENT BLOOD CLOTS (Patient taking differently: Take 2 mg by mouth every evening. ) 180 tablet 1  . gabapentin (NEURONTIN) 300 MG capsule TAKE  1 CAPSULE BY MOUTH THREE TIMES A DAY (Patient not taking: No sig reported) 270 capsule 1  . levothyroxine (SYNTHROID) 100 MCG tablet Take 1/2 tablet daily for thyroid. (Patient not taking: Reported on 11/22/2019) 45 tablet 1  . metoprolol tartrate (LOPRESSOR) 100 MG tablet Take 1 tablet 2 x /day for BP (Patient not taking: Reported on 11/22/2019) 30 tablet 6  . sucralfate (CARAFATE) 1 g tablet TAKE 1 TABLET (1 G TOTAL) BY MOUTH 4 (FOUR) TIMES DAILY - WITH MEALS AND AT BEDTIME. (Patient taking differently: Take 1 g by mouth in the morning, at noon, and at bedtime. ) 360 tablet 3   No facility-administered medications prior to visit.     Allergies:   Amiodarone, Adhesive [tape], Hydrocodone, Other, Reglan [metoclopramide], Remeron [mirtazapine], Codeine, Morphine and related, Requip [ropinirole hcl], and Zinc   Social History   Socioeconomic History  . Marital status: Widowed    Spouse name: Not on file  . Number of children: 3  . Years of education: Not on file  . Highest education level: Not on file  Occupational History  . Not on file  Tobacco Use  . Smoking status: Never Smoker  . Smokeless tobacco: Never Used  Substance and Sexual Activity  . Alcohol use: No  . Drug use: No  . Sexual activity: Never    Comment: Hysterectomy  Other Topics Concern  . Not on file  Social History Narrative   01/01/19 Lives in [place] w/dgtr, [name].  Continues to work.   Social Determinants of Health   Financial Resource Strain:   . Difficulty of Paying Living Expenses:   Food Insecurity:   . Worried About Charity fundraiser in the Last Year:   . Arboriculturist in the Last Year:   Transportation Needs:   . Film/video editor (Medical):   [name] Lack of Transportation (Non-Medical):   Physical Activity:   . Days of Exercise per Week:   . Minutes of Exercise per Session:   Stress:   . Feeling of Stress :   Social Connections:   . Frequency of Communication with Friends and Family:    . Frequency of Social Gatherings with Friends and Family:   . Attends Religious Services:   . Active Member of Clubs or Organizations:   . Attends Archivist Meetings:   [name] Marital Status:      Family History:  The patient'sfamily history includes Colon cancer (age of onset: 92) in her mother; Heart attack in her father; Heart disease in her father, maternal grandmother, and mother.     ROS:   Please see the history of present illness.    ROS All other systems reviewed and are negative.   PHYSICAL EXAM:   VS:  BP 134/70   Pulse 72   Ht 4\' 11"  (1.499 m)   Wt 98 lb 6.4 oz (44.6 kg)   LMP  (LMP Unknown)   SpO2 96%   BMI 19.87 kg/m    GEN: Well nourished, well developed, in no acute distress HEENT: normal Neck: no JVD or masses Cardiac: irrieg irreg; 2/6 diastolic murmur heart best at RUSB. No rubs, or gallops.  Respiratory:  clear to auscultation bilaterally, normal work of breathing GI: soft, nontender, nondistended, + BS MS: no deformity or atrophy Skin: warm and dry,  no rash Neuro:  Alert and Oriented x 3, Strength and sensation are intact Psych: euthymic mood, full affect   Wt Readings from Last 3 Encounters:  11/22/19 98 lb 6.4 oz (44.6 kg)  11/07/19 99 lb 3.2 oz (45 kg)  11/05/19 100 lb 12.8 oz (45.7 kg)      Studies/Labs Reviewed:   EKG:  EKG is NOT ordered today.   Recent Labs: 03/02/2019: NT-Pro BNP 10,870 08/08/2019: TSH 2.23 10/26/2019: ALT 26; B Natriuretic Peptide 2,138.2 10/31/2019: Hemoglobin 12.4; Magnesium 1.6; Platelets 141 11/05/2019: BUN 19; Creatinine, Ser 0.91; Potassium 4.1; Sodium 143   Lipid Panel    Component Value Date/Time   CHOL 113 08/08/2019 1030   TRIG 100 08/08/2019 1030   HDL 40 (L) 08/08/2019 1030   CHOLHDL 2.8 08/08/2019 1030   VLDL 29 03/23/2017 1445   LDLCALC 54 08/08/2019 1030    Additional studies/ records that were reviewed today include:  TAVR OPERATIVE NOTE   Date of  Procedure:10/30/2019  Preoperative Diagnosis:Severe Aortic Stenosis   Postoperative Diagnosis:Same   Procedure:   Transcatheter Aortic Valve Replacement - OpenLeftTranscarotid Approach Edwards Sapien 3 THV (size 95mm, model # 9600TFX, serial # [id number])  Co-Surgeons:Clarence H. Roxy Manns, MD and [name], MD  Anesthesiologist:Chris Ermalene Postin, MD  Dala Dock, MD  Pre-operative Echo Findings: ? Paradoxical Low Flow with Normal EFSevere aortic stenosis ? Normalleft ventricular systolic function  Post-operative Echo Findings: ? Mildparavalvular leak ? Normalleft ventricular systolic function  _____________   Echo 10/31/19: IMPRESSIONS 1. Left ventricular ejection fraction, by estimation, is 70 to 75%. The  left ventricle has hyperdynamic function. The left ventricle has no  regional wall motion abnormalities. There is moderate concentric left  ventricular hypertrophy. Left ventricular  diastolic function could not be evaluated.  2. Right ventricular systolic function is normal. The right ventricular  size is normal. There is moderately elevated pulmonary artery systolic  pressure. The estimated right ventricular systolic pressure is [id number] mmHg.  3. Left atrial size was mildly dilated.  4. Right atrial size was mildly dilated.  5. Large pleural effusion in the left lateral region.  6. The mitral valve is normal in structure and function. Moderate mitral  valve regurgitation. No evidence of mitral stenosis.  7. There is mild paravalvular leak (at 5'clock on short axis), most  prominent on images 82, 83 where it appears mild to moderate.   Normal peak/mean transaortic gradients 15/7 mmHg.. The aortic valve  has been repaired/replaced. Aortic valve regurgitation is not visualized.  No aortic stenosis is present. Echo  findings are consistent with  perivalvular leak of the aortic prosthesis.  Aortic valve mean gradient measures 7.0 mmHg.  8. The inferior vena cava is normal in size with greater than 50%  respiratory variability, suggesting right atrial pressure of 3 mmHg.   ___________________  Limited echo 11/05/19: IMPRESSIONS 1. Left ventricular ejection fraction, by estimation, is 60 to 65%. The  left ventricle has normal function. The left ventricle has no regional  wall motion abnormalities. There is moderate left ventricular hypertrophy.  Left ventricular diastolic  parameters are indeterminate.  2. Moderate pleural effusion in the left lateral region.  3. The mitral valve is degenerative. Moderate mitral annular  calcifications. Moderate mitral valve regurgitation.  4. Aortic dilatation noted. There is dilatation of the ascending aorta  measuring 40 mm.  5. Tricuspid valve regurgitation is mild to moderate.  6. Right ventricular systolic function is mildly reduced. The right  ventricular size is normal. There is moderately elevated  pulmonary artery  systolic pressure. The estimated right ventricular systolic pressure is  123456 mmHg.  7. The inferior vena cava is normal in size with <50% respiratory  variability, suggesting right atrial pressure of 8 mmHg.  8. There is a 23 mm Edwards Sapien prosthetic (TAVR) valve present in the  aortic position. Procedure Date: 10/30/19. Mean gradient 32mmHg. Echo  findings are consistent with perivalvular leak of the aortic prosthesis.  Paravalvular leak appears  mild-moderate, unchanged from prior TTE on 2/24.   Comparison(s): 10/31/19 EF 70-75%. PA 76mmHg. AV 61mmHg mean, 56mmhg peak. Mild-moderate perivalvular AI.   ___________________  Echo 11/22/19 IMPRESSIONS  1. Left ventricular ejection fraction, by estimation, is 55 to 60%. The  left ventricle has normal function. The left ventricle has no regional  wall motion abnormalities. There is  mild concentric left ventricular  hypertrophy. Left ventricular diastolic  parameters are indeterminate. Elevated left ventricular end-diastolic  pressure.  2. Right ventricular systolic function is normal. The right ventricular  size is normal. There is mildly elevated pulmonary artery systolic  pressure.  3. Left atrial size was severely dilated.  4. Right atrial size was severely dilated.  5. The mitral valve is normal in structure. Mild mitral valve  regurgitation. No evidence of mitral stenosis.  6. Mild to moderate perivalvular aortic regurgitation unchanged from  prior. The aortic valve has been repaired/replaced. Aortic valve  regurgitation is mild to moderate. No aortic stenosis is present. There is  a 23 mm Edwards Sapien prosthetic (TAVR)  valve present in the aortic position. Procedure Date: 10/30/19. Aortic  regurgitation PHT measures 358 msec. Aortic valve mean gradient measures  10.0 mmHg. Aortic valve Vmax measures 2.11 m/s.  7. Aortic dilatation noted. There is mild dilatation of the ascending  aorta.  8. The inferior vena cava is normal in size with greater than 50%  respiratory variability, suggesting right atrial pressure of 3 mmHg.   ASSESSMENT & PLAN:   Severe AS s/p TAVR: patient continues to feel poorly with NYHA class III symptoms. She complains of fatigue and persistent exertional dyspnea. Echo today shows EF 55%, normally functioning TAVR with mean gradient of 10 mm Hg and stable mild-mod PVL. She will continue on long term coumadin. I will have Dr. Roxy Manns and Dr. Angelena Form review her echo.  Chronic diastolic CHF: she appears euvolemic on exam. Weight is stable around 98 lbs. Impedance on device is stable. Continue on Lasix 20mg  daily.   Persistent atrial fibrillation: I wonder if this could be contributing to her continue dyspnea, fatigue and palpations. I had her device interrogated today which shows she has been in persistent afib since at least  December. Echo on POD1 showed mildly dilated LA, but echo today reports severe LA dilation. She has previous issues with amiodarone (listed thyroid, liver and kidney issues). She would like to see if DCCV back into sinus rhythm would help her symptoms. She will need to have at least 4 therapeutic INRs before we can get this scheduled. INR 2.5 on 3/8 and 2.6 today. Home health will check again next week. I worry about her ability to maintain sinus long term- I will reach out to Dr. Rayann Heman to see if he has any suggestions.   HTN: BP has been labile. Okay today. Pt's daughter sent me a log through MyChart with mostly uncontrolled BPs. I asked her to start amlodipine which they never started. She will pick this up from the pharmacy today  TAA: pre TAVR CT showed stable ectatic  4.0 cm ascending thoracic aorta. Recommend annual imaging followup by CTA or MRA. Will follow over time.  PAD: pre TAVR CT showed severe aortic and iliofemoral atherosclerosis with multifocal high-grade stenoses in the right external iliac artery. She does get some exertional leg pain, although difficult to tease out given concurrently peripheral neuropathy. Will get her into one of our [facility] doctors.   Total time spent with patient was over 40 minutes which included evaluating patient, reviewing record and coordinating care. Face to face time >50%.    Medication Adjustments/Labs and Tests Ordered: Current medicines are reviewed at length with the patient today.  Concerns regarding medicines are outlined above.  Medication changes, Labs and Tests ordered today are listed in the Patient Instructions below. There are no Patient Instructions on file for this visit.   Signed, Angelena Form, PA-C  11/22/2019 4:23 PM    [facility] [address], [place], [place]  [address] Phone: [phone]; Fax: [phone]

## 2019-11-23 ENCOUNTER — Telehealth: Payer: Self-pay | Admitting: Physician Assistant

## 2019-11-23 NOTE — Addendum Note (Signed)
Addended by: Emmaline Life on: 11/23/2019 10:38 AM   Modules accepted: Orders

## 2019-11-23 NOTE — Telephone Encounter (Signed)
°  Pt's daughter called, she said she received a call from Angelena Form Nurse and she missed it, she thinking it's something to do with pt's upcoming surgery on 11/28/19. She's requesting a callback  Please call

## 2019-11-23 NOTE — Telephone Encounter (Signed)
Spoke with patient's daughter, [name], regarding plan for cardioversion. She is aware that patient will need lab appointment and Covid screening prior to procedure. I answered her questions about the procedure. She asks why she did not know that patient was in a fib from previous device checks and that she monitors patient's HR and BP at home with home BP cuff and with pulse oximeter. I explained how the BP monitor may indicate "irregular" but that there is no way to tell exactly the rhythm. The patient's last remote device check was December and patient was reported to be in NSR. I advised that once cardioversion is scheduled I will call her back to review dates and times of procedure, labs, and Covid. [name] verbalized understanding and agreement and thanked me for the call.

## 2019-11-23 NOTE — Progress Notes (Signed)
Sounds good. Thanks 

## 2019-11-23 NOTE — Telephone Encounter (Signed)
Called patient's daughter [name] to review plan regarding cardioversion.  Patient is scheduled for DCCV on Wed. 3/31 at [facility] with Dr. Margaretann Loveless, arrival 1030.  Lab appointment and Covid screening are scheduled for Monday 3/29. I reviewed pre-procedure instructions with [name] who verbalized understanding. She is aware to have patient hold furosemide and Kdur on the morning of 3/31. She is also aware that she will receive a call from PharmD about patient's warfarin dose.  I advised that most recent home remote device check did not show a fib which was a question daughter had asked me. I advised that I have sent a note to Dr. Doug Sou nurse about follow-up. [name] asks if patient should see vascular surgeon or cardiologist for PV work-up and I advised that she should discuss this with Dr. [name] at the time of the visit since he is primary cardiologist. I advised her to call back prior to procedure with any questions or concerns. [name] verbalized understanding and agreement and thanked me for the call.

## 2019-11-26 ENCOUNTER — Telehealth (HOSPITAL_COMMUNITY): Payer: Self-pay | Admitting: Physician Assistant

## 2019-11-26 ENCOUNTER — Other Ambulatory Visit: Payer: Self-pay | Admitting: Physician Assistant

## 2019-11-26 MED ORDER — AMOXICILLIN 500 MG PO TABS: 2000.0000 mg | ORAL_TABLET | ORAL | 12 refills | Status: AC

## 2019-11-26 NOTE — Telephone Encounter (Signed)
Called and left message for patient to call back.  Need to schedule 1 wk f/u after dccv on 12/05/19 per message from [facility], PA-C.

## 2019-11-27 DIAGNOSIS — C672 Malignant neoplasm of lateral wall of bladder: Secondary | ICD-10-CM | POA: Diagnosis not present

## 2019-11-28 ENCOUNTER — Other Ambulatory Visit: Payer: Self-pay

## 2019-11-28 ENCOUNTER — Ambulatory Visit (INDEPENDENT_AMBULATORY_CARE_PROVIDER_SITE_OTHER): Payer: Medicare Other

## 2019-11-28 DIAGNOSIS — Z7901 Long term (current) use of anticoagulants: Secondary | ICD-10-CM

## 2019-11-28 DIAGNOSIS — I081 Rheumatic disorders of both mitral and tricuspid valves: Secondary | ICD-10-CM | POA: Diagnosis not present

## 2019-11-28 DIAGNOSIS — Z952 Presence of prosthetic heart valve: Secondary | ICD-10-CM | POA: Diagnosis not present

## 2019-11-28 DIAGNOSIS — Z5181 Encounter for therapeutic drug level monitoring: Secondary | ICD-10-CM | POA: Diagnosis not present

## 2019-11-28 DIAGNOSIS — I495 Sick sinus syndrome: Secondary | ICD-10-CM | POA: Diagnosis not present

## 2019-11-28 DIAGNOSIS — E039 Hypothyroidism, unspecified: Secondary | ICD-10-CM | POA: Diagnosis not present

## 2019-11-28 DIAGNOSIS — J301 Allergic rhinitis due to pollen: Secondary | ICD-10-CM | POA: Diagnosis not present

## 2019-11-28 DIAGNOSIS — K3184 Gastroparesis: Secondary | ICD-10-CM | POA: Diagnosis not present

## 2019-11-28 DIAGNOSIS — G2581 Restless legs syndrome: Secondary | ICD-10-CM | POA: Diagnosis not present

## 2019-11-28 DIAGNOSIS — I480 Paroxysmal atrial fibrillation: Secondary | ICD-10-CM | POA: Diagnosis not present

## 2019-11-28 DIAGNOSIS — G5791 Unspecified mononeuropathy of right lower limb: Secondary | ICD-10-CM | POA: Diagnosis not present

## 2019-11-28 DIAGNOSIS — I4821 Permanent atrial fibrillation: Secondary | ICD-10-CM

## 2019-11-28 DIAGNOSIS — Z48812 Encounter for surgical aftercare following surgery on the circulatory system: Secondary | ICD-10-CM | POA: Diagnosis not present

## 2019-11-28 DIAGNOSIS — I130 Hypertensive heart and chronic kidney disease with heart failure and stage 1 through stage 4 chronic kidney disease, or unspecified chronic kidney disease: Secondary | ICD-10-CM | POA: Diagnosis not present

## 2019-11-28 DIAGNOSIS — H5462 Unqualified visual loss, left eye, normal vision right eye: Secondary | ICD-10-CM | POA: Diagnosis not present

## 2019-11-28 DIAGNOSIS — E559 Vitamin D deficiency, unspecified: Secondary | ICD-10-CM | POA: Diagnosis not present

## 2019-11-28 DIAGNOSIS — E875 Hyperkalemia: Secondary | ICD-10-CM | POA: Diagnosis not present

## 2019-11-28 DIAGNOSIS — I2510 Atherosclerotic heart disease of native coronary artery without angina pectoris: Secondary | ICD-10-CM | POA: Diagnosis not present

## 2019-11-28 DIAGNOSIS — Z950 Presence of cardiac pacemaker: Secondary | ICD-10-CM | POA: Diagnosis not present

## 2019-11-28 DIAGNOSIS — I272 Pulmonary hypertension, unspecified: Secondary | ICD-10-CM | POA: Diagnosis not present

## 2019-11-28 DIAGNOSIS — N183 Chronic kidney disease, stage 3 unspecified: Secondary | ICD-10-CM | POA: Diagnosis not present

## 2019-11-28 DIAGNOSIS — K219 Gastro-esophageal reflux disease without esophagitis: Secondary | ICD-10-CM | POA: Diagnosis not present

## 2019-11-28 DIAGNOSIS — I5033 Acute on chronic diastolic (congestive) heart failure: Secondary | ICD-10-CM | POA: Diagnosis not present

## 2019-11-28 DIAGNOSIS — H6523 Chronic serous otitis media, bilateral: Secondary | ICD-10-CM | POA: Diagnosis not present

## 2019-11-28 DIAGNOSIS — Z85038 Personal history of other malignant neoplasm of large intestine: Secondary | ICD-10-CM | POA: Diagnosis not present

## 2019-11-28 LAB — CUP PACEART INCLINIC DEVICE CHECK
Battery Impedance: 2135 Ohm
Battery Remaining Longevity: 31 mo
Battery Voltage: 2.73 V
Brady Statistic AP VP Percent: 0 %
Brady Statistic AP VS Percent: 60 %
Brady Statistic AS VP Percent: 0 %
Brady Statistic AS VS Percent: 40 %
Date Time Interrogation Session: 20210318161100
Implantable Lead Implant Date: 20111027
Implantable Lead Implant Date: 20111027
Implantable Lead Location: 753859
Implantable Lead Location: 753860
Implantable Lead Model: 4469
Implantable Lead Model: 4470
Implantable Lead Serial Number: 548229
Implantable Lead Serial Number: 686026
Implantable Pulse Generator Implant Date: 20111027
Lead Channel Impedance Value: 407 Ohm
Lead Channel Impedance Value: 441 Ohm
Lead Channel Pacing Threshold Amplitude: 1.5 V
Lead Channel Pacing Threshold Pulse Width: 0.4 ms
Lead Channel Sensing Intrinsic Amplitude: 1 mV
Lead Channel Sensing Intrinsic Amplitude: 5.6 mV
Lead Channel Setting Pacing Amplitude: 2 V
Lead Channel Setting Pacing Amplitude: 4.5 V
Lead Channel Setting Pacing Pulse Width: 0.4 ms
Lead Channel Setting Sensing Sensitivity: 2 mV

## 2019-11-28 LAB — POCT INR: INR: 3.5 — AB (ref 2.0–3.0)

## 2019-11-28 NOTE — Progress Notes (Signed)
Pacemaker check in clinic. Normal device function. Thresholds, sensing, impedances consistent with previous measurements. Device programmed to maximize longevity.  Patient currently in AF and episode started 09/13/19 and has lasted > 96 hours., + Coumadin. Patient symptomatic with SOB with all activity. being seen by cardiology PA today.2 high ventricular rates noted that were AF with RVR. Device programmed at appropriate safety margins. Histogram distribution appropriate for patient activity level. Device programmed to optimize intrinsic conduction. Estimated longevity 2 years 7 months. Patient enrolled in remote follow ups and next remote 11/29/19. Patient education completed.

## 2019-11-28 NOTE — Patient Instructions (Signed)
Description   Spoke with Tabitha [facility] RN while in pt's home, advised to have pt hold today's dosage of Warfarin, then resume same dosage 2mg  daily. Recheck INR in 1 week prior to DCCV at 11:30am. Call us with any medication changes or concerns # [phone]. Lawerance Bach [facility] [id number]

## 2019-11-29 ENCOUNTER — Ambulatory Visit: Payer: Medicare Other | Admitting: Physician Assistant

## 2019-11-29 ENCOUNTER — Other Ambulatory Visit (HOSPITAL_COMMUNITY): Payer: Medicare Other

## 2019-11-29 ENCOUNTER — Ambulatory Visit (INDEPENDENT_AMBULATORY_CARE_PROVIDER_SITE_OTHER): Payer: Medicare Other | Admitting: *Deleted

## 2019-11-29 DIAGNOSIS — Z950 Presence of cardiac pacemaker: Secondary | ICD-10-CM | POA: Diagnosis not present

## 2019-11-29 LAB — CUP PACEART REMOTE DEVICE CHECK
Battery Impedance: 2230 Ohm
Battery Remaining Longevity: 23 mo
Battery Voltage: 2.74 V
Brady Statistic AP VP Percent: 2 %
Brady Statistic AP VS Percent: 0 %
Brady Statistic AS VP Percent: 2 %
Brady Statistic AS VS Percent: 96 %
Date Time Interrogation Session: 20210325133627
Implantable Lead Implant Date: 20111027
Implantable Lead Implant Date: 20111027
Implantable Lead Location: 753859
Implantable Lead Location: 753860
Implantable Lead Model: 4469
Implantable Lead Model: 4470
Implantable Lead Serial Number: 548229
Implantable Lead Serial Number: 686026
Implantable Pulse Generator Implant Date: 20111027
Lead Channel Impedance Value: 434 Ohm
Lead Channel Impedance Value: 448 Ohm
Lead Channel Pacing Threshold Amplitude: 0.375 V
Lead Channel Pacing Threshold Amplitude: 2 V
Lead Channel Pacing Threshold Pulse Width: 0.4 ms
Lead Channel Pacing Threshold Pulse Width: 0.4 ms
Lead Channel Setting Pacing Amplitude: 2 V
Lead Channel Setting Pacing Amplitude: 4 V
Lead Channel Setting Pacing Pulse Width: 0.4 ms
Lead Channel Setting Sensing Sensitivity: 2 mV

## 2019-11-29 NOTE — Progress Notes (Signed)
PPM Remote  

## 2019-12-03 ENCOUNTER — Other Ambulatory Visit (HOSPITAL_COMMUNITY)
Admission: RE | Admit: 2019-12-03 | Discharge: 2019-12-03 | Disposition: A | Discharge status: Home / Self Care | Payer: Medicare Other | Source: Ambulatory Visit | Attending: Internal Medicine | Admitting: Internal Medicine

## 2019-12-03 ENCOUNTER — Other Ambulatory Visit: Payer: Self-pay

## 2019-12-03 ENCOUNTER — Other Ambulatory Visit: Payer: Medicare Other | Admitting: *Deleted

## 2019-12-03 DIAGNOSIS — Z952 Presence of prosthetic heart valve: Secondary | ICD-10-CM

## 2019-12-03 DIAGNOSIS — Z01812 Encounter for preprocedural laboratory examination: Secondary | ICD-10-CM | POA: Insufficient documentation

## 2019-12-03 DIAGNOSIS — Z20822 Contact with and (suspected) exposure to covid-19: Secondary | ICD-10-CM | POA: Diagnosis not present

## 2019-12-03 DIAGNOSIS — I4819 Other persistent atrial fibrillation: Secondary | ICD-10-CM

## 2019-12-03 LAB — CBC
Hematocrit: 40.8 % (ref 34.0–46.6)
Hemoglobin: 13.7 g/dL (ref 11.1–15.9)
MCH: 32.8 pg (ref 26.6–33.0)
MCHC: 33.6 g/dL (ref 31.5–35.7)
MCV: 98 fL — ABNORMAL HIGH (ref 79–97)
Platelets: 146 10*3/uL — ABNORMAL LOW (ref 150–450)
RBC: 4.18 x10E6/uL (ref 3.77–5.28)
RDW: 13.6 % (ref 11.7–15.4)
WBC: 7.5 10*3/uL (ref 3.4–10.8)

## 2019-12-03 LAB — BASIC METABOLIC PANEL
BUN/Creatinine Ratio: 18 (ref 12–28)
BUN: 20 mg/dL (ref 8–27)
CO2: 26 mmol/L (ref 20–29)
Calcium: 9.3 mg/dL (ref 8.7–10.3)
Chloride: 109 mmol/L — ABNORMAL HIGH (ref 96–106)
Creatinine, Ser: 1.09 mg/dL — ABNORMAL HIGH (ref 0.57–1.00)
GFR calc Af Amer: 54 mL/min/{1.73_m2} — ABNORMAL LOW (ref >59)
GFR calc non Af Amer: 47 mL/min/{1.73_m2} — ABNORMAL LOW (ref >59)
Glucose: 87 mg/dL (ref 65–99)
Potassium: 3.7 mmol/L (ref 3.5–5.2)
Sodium: 139 mmol/L (ref 134–144)

## 2019-12-03 LAB — SARS CORONAVIRUS 2 (TAT 6-24 HRS): SARS Coronavirus 2: NEGATIVE

## 2019-12-04 ENCOUNTER — Other Ambulatory Visit: Payer: Self-pay | Admitting: Cardiology

## 2019-12-04 ENCOUNTER — Other Ambulatory Visit: Payer: Self-pay | Admitting: Physician Assistant

## 2019-12-05 ENCOUNTER — Ambulatory Visit (HOSPITAL_COMMUNITY)
Admission: RE | Admit: 2019-12-05 | Discharge: 2019-12-05 | Disposition: A | Discharge status: Home / Self Care | Payer: Medicare Other | Attending: Internal Medicine | Admitting: Internal Medicine

## 2019-12-05 ENCOUNTER — Ambulatory Visit (HOSPITAL_COMMUNITY): Payer: Medicare Other | Admitting: Registered Nurse

## 2019-12-05 ENCOUNTER — Telehealth: Payer: Self-pay | Admitting: Cardiology

## 2019-12-05 ENCOUNTER — Other Ambulatory Visit: Payer: Self-pay

## 2019-12-05 ENCOUNTER — Encounter (HOSPITAL_COMMUNITY)
Admission: RE | Disposition: A | Discharge status: Home / Self Care | Payer: Self-pay | Source: Home / Self Care | Attending: Internal Medicine

## 2019-12-05 ENCOUNTER — Ambulatory Visit (INDEPENDENT_AMBULATORY_CARE_PROVIDER_SITE_OTHER): Payer: Medicare Other | Admitting: Internal Medicine

## 2019-12-05 DIAGNOSIS — I4819 Other persistent atrial fibrillation: Secondary | ICD-10-CM | POA: Insufficient documentation

## 2019-12-05 DIAGNOSIS — I480 Paroxysmal atrial fibrillation: Secondary | ICD-10-CM | POA: Diagnosis not present

## 2019-12-05 DIAGNOSIS — Z905 Acquired absence of kidney: Secondary | ICD-10-CM | POA: Insufficient documentation

## 2019-12-05 DIAGNOSIS — G2581 Restless legs syndrome: Secondary | ICD-10-CM | POA: Insufficient documentation

## 2019-12-05 DIAGNOSIS — I352 Nonrheumatic aortic (valve) stenosis with insufficiency: Secondary | ICD-10-CM | POA: Diagnosis not present

## 2019-12-05 DIAGNOSIS — Z903 Acquired absence of stomach [part of]: Secondary | ICD-10-CM | POA: Insufficient documentation

## 2019-12-05 DIAGNOSIS — Z888 Allergy status to other drugs, medicaments and biological substances status: Secondary | ICD-10-CM | POA: Insufficient documentation

## 2019-12-05 DIAGNOSIS — K3184 Gastroparesis: Secondary | ICD-10-CM | POA: Diagnosis not present

## 2019-12-05 DIAGNOSIS — G629 Polyneuropathy, unspecified: Secondary | ICD-10-CM | POA: Insufficient documentation

## 2019-12-05 DIAGNOSIS — Z8673 Personal history of transient ischemic attack (TIA), and cerebral infarction without residual deficits: Secondary | ICD-10-CM | POA: Insufficient documentation

## 2019-12-05 DIAGNOSIS — I5032 Chronic diastolic (congestive) heart failure: Secondary | ICD-10-CM | POA: Insufficient documentation

## 2019-12-05 DIAGNOSIS — E039 Hypothyroidism, unspecified: Secondary | ICD-10-CM | POA: Insufficient documentation

## 2019-12-05 DIAGNOSIS — Z48812 Encounter for surgical aftercare following surgery on the circulatory system: Secondary | ICD-10-CM | POA: Diagnosis not present

## 2019-12-05 DIAGNOSIS — Z950 Presence of cardiac pacemaker: Secondary | ICD-10-CM | POA: Insufficient documentation

## 2019-12-05 DIAGNOSIS — E559 Vitamin D deficiency, unspecified: Secondary | ICD-10-CM | POA: Diagnosis not present

## 2019-12-05 DIAGNOSIS — D649 Anemia, unspecified: Secondary | ICD-10-CM | POA: Insufficient documentation

## 2019-12-05 DIAGNOSIS — I130 Hypertensive heart and chronic kidney disease with heart failure and stage 1 through stage 4 chronic kidney disease, or unspecified chronic kidney disease: Secondary | ICD-10-CM | POA: Insufficient documentation

## 2019-12-05 DIAGNOSIS — K219 Gastro-esophageal reflux disease without esophagitis: Secondary | ICD-10-CM | POA: Diagnosis not present

## 2019-12-05 DIAGNOSIS — Z5181 Encounter for therapeutic drug level monitoring: Secondary | ICD-10-CM

## 2019-12-05 DIAGNOSIS — I495 Sick sinus syndrome: Secondary | ICD-10-CM | POA: Insufficient documentation

## 2019-12-05 DIAGNOSIS — N183 Chronic kidney disease, stage 3 unspecified: Secondary | ICD-10-CM | POA: Insufficient documentation

## 2019-12-05 DIAGNOSIS — M1990 Unspecified osteoarthritis, unspecified site: Secondary | ICD-10-CM | POA: Insufficient documentation

## 2019-12-05 DIAGNOSIS — H5462 Unqualified visual loss, left eye, normal vision right eye: Secondary | ICD-10-CM | POA: Diagnosis not present

## 2019-12-05 DIAGNOSIS — Z885 Allergy status to narcotic agent status: Secondary | ICD-10-CM | POA: Insufficient documentation

## 2019-12-05 DIAGNOSIS — Z79890 Hormone replacement therapy (postmenopausal): Secondary | ICD-10-CM | POA: Diagnosis not present

## 2019-12-05 DIAGNOSIS — Z85038 Personal history of other malignant neoplasm of large intestine: Secondary | ICD-10-CM | POA: Diagnosis not present

## 2019-12-05 DIAGNOSIS — Z8249 Family history of ischemic heart disease and other diseases of the circulatory system: Secondary | ICD-10-CM | POA: Insufficient documentation

## 2019-12-05 DIAGNOSIS — Z7901 Long term (current) use of anticoagulants: Secondary | ICD-10-CM | POA: Insufficient documentation

## 2019-12-05 DIAGNOSIS — F419 Anxiety disorder, unspecified: Secondary | ICD-10-CM | POA: Insufficient documentation

## 2019-12-05 DIAGNOSIS — Z79899 Other long term (current) drug therapy: Secondary | ICD-10-CM | POA: Diagnosis not present

## 2019-12-05 DIAGNOSIS — H6523 Chronic serous otitis media, bilateral: Secondary | ICD-10-CM | POA: Diagnosis not present

## 2019-12-05 DIAGNOSIS — E785 Hyperlipidemia, unspecified: Secondary | ICD-10-CM | POA: Insufficient documentation

## 2019-12-05 DIAGNOSIS — Z952 Presence of prosthetic heart valve: Secondary | ICD-10-CM | POA: Diagnosis not present

## 2019-12-05 DIAGNOSIS — E875 Hyperkalemia: Secondary | ICD-10-CM | POA: Diagnosis not present

## 2019-12-05 DIAGNOSIS — I4891 Unspecified atrial fibrillation: Secondary | ICD-10-CM

## 2019-12-05 DIAGNOSIS — I2510 Atherosclerotic heart disease of native coronary artery without angina pectoris: Secondary | ICD-10-CM | POA: Insufficient documentation

## 2019-12-05 DIAGNOSIS — I081 Rheumatic disorders of both mitral and tricuspid valves: Secondary | ICD-10-CM | POA: Diagnosis not present

## 2019-12-05 DIAGNOSIS — I272 Pulmonary hypertension, unspecified: Secondary | ICD-10-CM | POA: Diagnosis not present

## 2019-12-05 DIAGNOSIS — J301 Allergic rhinitis due to pollen: Secondary | ICD-10-CM | POA: Diagnosis not present

## 2019-12-05 DIAGNOSIS — Z96651 Presence of right artificial knee joint: Secondary | ICD-10-CM | POA: Insufficient documentation

## 2019-12-05 DIAGNOSIS — G5791 Unspecified mononeuropathy of right lower limb: Secondary | ICD-10-CM | POA: Diagnosis not present

## 2019-12-05 DIAGNOSIS — I5033 Acute on chronic diastolic (congestive) heart failure: Secondary | ICD-10-CM | POA: Diagnosis not present

## 2019-12-05 HISTORY — PX: CARDIOVERSION: SHX1299

## 2019-12-05 LAB — POCT INR: INR: 3.3 — AB (ref 2.0–3.0)

## 2019-12-05 LAB — PROTIME-INR
INR: 2.7 — ABNORMAL HIGH (ref 0.8–1.2)
Prothrombin Time: 28.4 seconds — ABNORMAL HIGH (ref 11.4–15.2)

## 2019-12-05 SURGERY — CARDIOVERSION
Anesthesia: General

## 2019-12-05 MED ORDER — SODIUM CHLORIDE 0.9 % IV SOLN
INTRAVENOUS | Status: AC | PRN
  Administered 2019-12-05: 500 mL via INTRAMUSCULAR

## 2019-12-05 MED ORDER — LIDOCAINE HCL (CARDIAC) PF 100 MG/5ML IV SOSY
PREFILLED_SYRINGE | INTRAVENOUS | Status: DC | PRN
  Administered 2019-12-05: 40 mg via INTRATRACHEAL

## 2019-12-05 MED ORDER — PROPOFOL 10 MG/ML IV BOLUS
INTRAVENOUS | Status: DC | PRN
  Administered 2019-12-05: 30 mg via INTRAVENOUS

## 2019-12-05 NOTE — Patient Instructions (Signed)
Description   Spoke with [place] [facility] RN while in pt's home, advised to have pt hold today's dosage of Warfarin, then resume same dosage 2mg  daily. Recheck INR in 1 week post DCCV. Call us with any medication changes or concerns # [phone]. [place]'s # [phone]

## 2019-12-05 NOTE — CV Procedure (Signed)
Procedure: Electrical Cardioversion Indications:  Atrial Fibrillation  Procedure Details:  Consent: Risks of procedure as well as the alternatives and risks of each were explained to the (patient/caregiver).  Consent for procedure obtained.  Time Out: Verified patient identification, verified procedure, site/side was marked, verified correct patient position, special equipment/implants available, medications/allergies/relevent history reviewed, required imaging and test results available. PERFORMED.  Patient placed on cardiac monitor, pulse oximetry, supplemental oxygen as necessary.  Sedation given: propofol per anesthesia Pacer pads placed anterior and posterior chest.  Cardioverted 1 time(s).  Cardioversion with synchronized biphasic 120J shock.  Evaluation: Findings: Post procedure EKG shows: NSR Complications: None Patient did tolerate procedure well.  Time Spent Directly with the Patient:  30 minutes   Elouise Munroe 12/05/2019, 12:09 PM

## 2019-12-05 NOTE — Transfer of Care (Signed)
Immediate Anesthesia Transfer of Care Note  Patient: [name]  Procedure(s) Performed: CARDIOVERSION (N/A )  Patient Location: Endoscopy Unit  Anesthesia Type:General  Level of Consciousness: drowsy and patient cooperative  Airway & Oxygen Therapy: Patient Spontanous Breathing  Post-op Assessment: Report given to RN and Post -op Vital signs reviewed and stable  Post vital signs: Reviewed and stable  Last Vitals:  Vitals Value Taken Time  BP 172/58   Temp    Pulse 71   Resp    SpO2 100     Last Pain:  Vitals:   12/05/19 1046  TempSrc: Oral  PainSc: 0-No pain         Complications: No apparent anesthesia complications

## 2019-12-05 NOTE — Telephone Encounter (Signed)
Spoke to patient's daughter [name] appointment scheduled with Dr.Jordan 4/1 at 2:20 pm.

## 2019-12-05 NOTE — Anesthesia Postprocedure Evaluation (Signed)
Anesthesia Post Note  Patient: [name]  Procedure(s) Performed: CARDIOVERSION (N/A )     Patient location during evaluation: Endoscopy Anesthesia Type: General Level of consciousness: sedated Pain management: pain level controlled Vital Signs Assessment: post-procedure vital signs reviewed and stable Respiratory status: spontaneous breathing Cardiovascular status: stable Postop Assessment: no apparent nausea or vomiting Anesthetic complications: no    Last Vitals:  Vitals:   12/05/19 1046 12/05/19 1208  BP: (!) 199/95 (!) 174/51  Pulse: 92 69  Resp: (!) 22 (!) 23  Temp: (!) 36.1 C   SpO2: 98% 99%    Last Pain:  Vitals:   12/05/19 1208  TempSrc:   PainSc: Asleep   Pain Goal:                   Huston Foley

## 2019-12-05 NOTE — Anesthesia Preprocedure Evaluation (Addendum)
Anesthesia Evaluation  Patient identified by MRN, date of birth, ID band Patient awake    Reviewed: Allergy & Precautions, NPO status , Patient's Chart, lab work & pertinent test results, reviewed documented beta blocker date and time   Airway Mallampati: I       Dental  (+) Poor Dentition,    Pulmonary    Pulmonary exam normal breath sounds clear to auscultation       Cardiovascular hypertension, Pt. on medications and Pt. on home beta blockers Normal cardiovascular exam Rhythm:Regular Rate:Normal     Neuro/Psych    GI/Hepatic GERD  Medicated and Controlled,  Endo/Other    Renal/GU      Musculoskeletal   Abdominal Normal abdominal exam  (+)   Peds  Hematology   Anesthesia Other Findings   Reproductive/Obstetrics                            Anesthesia Physical Anesthesia Plan  ASA: II  Anesthesia Plan: General   Post-op Pain Management:    Induction:   PONV Risk Score and Plan: Propofol infusion  Airway Management Planned: Natural Airway and Simple Face Mask  Additional Equipment: None  Intra-op Plan:   Post-operative Plan:   Informed Consent:   Plan Discussed with: CRNA  Anesthesia Plan Comments:         Anesthesia Quick Evaluation

## 2019-12-05 NOTE — Interval H&P Note (Signed)
History and Physical Interval Note:  12/05/2019 11:50 AM  [name]  has presented today for surgery, with the diagnosis of AFIB.  The various methods of treatment have been discussed with the patient and family. After consideration of risks, benefits and other options for treatment, the patient has consented to  Procedure(s): CARDIOVERSION (N/A) as a surgical intervention.  The patient's history has been reviewed, patient examined, no change in status, stable for surgery.  I have reviewed the patient's chart and labs.  Questions were answered to the patient's satisfaction.     Elouise Munroe

## 2019-12-05 NOTE — Telephone Encounter (Signed)
Patient's daughter says she is returning Cheryl's call.

## 2019-12-05 NOTE — Anesthesia Procedure Notes (Signed)
Procedure Name: General with mask airway Date/Time: 12/05/2019 11:56 AM Performed by: Kathryne Hitch, CRNA Pre-anesthesia Checklist: Patient identified, Emergency Drugs available, Suction available and Patient being monitored Patient Re-evaluated:Patient Re-evaluated prior to induction Oxygen Delivery Method: Ambu bag Preoxygenation: Pre-oxygenation with 100% oxygen Induction Type: IV induction Ventilation: Mask ventilation without difficulty Dental Injury: Teeth and Oropharynx as per pre-operative assessment

## 2019-12-06 ENCOUNTER — Ambulatory Visit: Payer: Medicare Other | Admitting: Cardiology

## 2019-12-06 VITALS — BP 162/64 | HR 90 | Ht 59.0 in | Wt 98.8 lb

## 2019-12-06 DIAGNOSIS — I350 Nonrheumatic aortic (valve) stenosis: Secondary | ICD-10-CM | POA: Diagnosis not present

## 2019-12-06 DIAGNOSIS — I10 Essential (primary) hypertension: Secondary | ICD-10-CM | POA: Diagnosis not present

## 2019-12-06 DIAGNOSIS — Z950 Presence of cardiac pacemaker: Secondary | ICD-10-CM

## 2019-12-06 DIAGNOSIS — I5032 Chronic diastolic (congestive) heart failure: Secondary | ICD-10-CM

## 2019-12-06 DIAGNOSIS — I4819 Other persistent atrial fibrillation: Secondary | ICD-10-CM | POA: Diagnosis not present

## 2019-12-06 DIAGNOSIS — Z952 Presence of prosthetic heart valve: Secondary | ICD-10-CM | POA: Diagnosis not present

## 2019-12-06 NOTE — Progress Notes (Signed)
Cardiology Office Note   Date:  12/06/2019   ID:  [name], DOB 1933/11/16, MRN [id number]  PCP:  [name], MD  Cardiologist:  Vinia Jemmott [name], MD EP: Thompson Grayer, MD  Chief Complaint  Patient presents with  . Atrial Fibrillation  . Aortic Stenosis      History of Present Illness: [name] is a 86 y.o. 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  She was last evaluated in December 2020 with  Repeat Echo study which showed EF 60-65%, moderate LVH, unable to assess LV diastolic function, no RWMA, mild LAE, mild MR, and severely calcified AV with suspected low-flow low gradient moderate-severe AS and moderate AI. She reported increased DOE requiring more lasix, as well at increased heart rates. On review of her echocardiogram, suspect she is back in atrial fibrillation. She had increased edema.    Diagnostic cardiac catheterization was performed  on October 09, 2019 revealed nonobstructive coronary artery disease. Peak to peak and mean transvalvular gradients across aortic valve were measured 19 and 11.4 mmHg respectively corresponding to aortic valve area calculated only 0.84 cm.  The patient was evaluated by the multidisciplinary valve team and underwent successful TAVR with a52mm Edwards Sapien 3 UltraTHV via the left carotidapproach on 10/30/19. Post operative echoshowed EF 70% s/p TAVR with mild-mod PVL and mean gradient of 7 mm Hg. Of note, pre op labs showed a markedly elevated BNP >2000 and some mild pulmonary congestion on CXR. There was some concern for potential valve migration. However, the patient was feeling well and was discharged on POD 1 (10/31/19). She was discharged on  coumadin alone given high risk of bleed.   She subsequently called with  acutely worsening shortness of breath, chest pressure, weight gain and orthopnea. She did improve with increase in lasix to 40 mg daily.  Limited echo 11/05/19 showed stable mild-mod PVL. BP was high and amlodipine was added. When seen in valve clinic on March 18 she was in Afib. She underwent DCCV yesterday 12/05/19 successfully.   On follow up today she states she has felt better in last 2 days. Still has some SOB. Activity limited to short walks with a cane. Appetite is good. Weight is down to 96 lbs. Still notes mild edema. Denies any leg pain bet legs do fatigue quickly with exertion. She has peripheral neuropathy so pain is difficult to assess.   Past Medical History:  Diagnosis Date  . Adenocarcinoma of stomach ([place]) 11/11/2015   gastric mass on egd,   . Anal fissure   . Anemia    hx 3/17 iron infusion, on aranesp and injected B12 since 11/2015.   [name] Anxiety   . Aortic root dilatation ([place])   . Arthritis    oa  . Blind left eye 2015   partial blind in left eye due to stroke   . 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 CAD (coronary artery disease)   . Cancer (HCC)    hx bladder, kidney, uterus  . Cataracts, bilateral   . Chronic kidney disease    only has one kidney rt lft rem 03 ca     dr. Risa Grill, bladder cancer 05-04-2018  . Elevated LFTs    hepatic steatosis, none recent  . Gastroparesis   .  GERD (gastroesophageal reflux disease)   . Hearing loss    wears hearing aids  . History of uterine cancer 1980s   treated with hysterectomy, external radiation and radiation seed implants.   [name] HTN (hypertension)   . Hyperlipemia   . Hypothyroidism   . Iron deficiency anemia due to chronic blood loss 11/24/2015  . Neuropathy    right leg  . Osteomyelitis ([place]) as child  . PAF (paroxysmal atrial fibrillation) ([place])   . Presence of permanent cardiac pacemaker   . Radiation proctitis   . Refusal of blood transfusions as patient  is Jehovah's Witness   . Restless leg syndrome   . 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 S/P TAVR (transcatheter aortic valve replacement)   . Severe aortic stenosis   . Stroke [facility]) 2015   partial stroke left legally blind; left eye   . Tachycardia-bradycardia syndrome ([place])    s/p PPM by Dr Doreatha Lew (MDT) 07/03/10  . Walker as ambulation aid   . Wears glasses   . Wears partial dentures    upper    Past Surgical History:  Procedure Laterality Date  . CARDIAC CATHETERIZATION  2008  . CARDIOVERSION N/A 12/05/2019   Procedure: CARDIOVERSION;  Surgeon: Elouise Munroe, MD;  Location: [facility] ENDOSCOPY;  Service: Cardiovascular;  Laterality: N/A;  . CHOLECYSTECTOMY  1970s  . COLONOSCOPY N/A 11/11/2015   Procedure: COLONOSCOPY;  Surgeon: [name], MD;  Location: [facility] ENDOSCOPY;  Service: Endoscopy;  Laterality: N/A;  . CYSTOSCOPY W/ RETROGRADES Right 05/01/2018   Procedure: CYSTOSCOPY WITH RIGHT RETROGRADE PYELOGRAM;  Surgeon: Lucas Mallow, MD;  Location: [facility] ORS;  Service: Urology;  Laterality: Right;  . CYSTOSCOPY W/ RETROGRADES Right 07/21/2018   Procedure: CYSTOSCOPY WITH RIGHT RETROGRADE PYELOGRAM;  Surgeon: Alexis Frock, MD;  Location: [facility] ORS;  Service: Urology;  Laterality: Right;  . ESOPHAGOGASTRODUODENOSCOPY N/A 11/11/2015   Procedure: ESOPHAGOGASTRODUODENOSCOPY (EGD);  Surgeon: [name], MD;  Location: [facility] ENDOSCOPY;  Service: Endoscopy;  Laterality: N/A;  . ESOPHAGOGASTRODUODENOSCOPY N/A 02/05/2016   Procedure: ESOPHAGOGASTRODUODENOSCOPY (EGD);  Surgeon: Ladene Artist, MD;  Location: [facility] ENDOSCOPY;  Service: Endoscopy;  Laterality: N/A;  . ESOPHAGOGASTRODUODENOSCOPY N/A 04/02/2016   Procedure: ESOPHAGOGASTRODUODENOSCOPY (EGD);  Surgeon: [name], MD;  Location: [facility] ENDOSCOPY;  Service: Endoscopy;  Laterality: N/A;  . ESOPHAGOGASTRODUODENOSCOPY (EGD)  WITH PROPOFOL N/A 04/06/2017   Procedure: ESOPHAGOGASTRODUODENOSCOPY (EGD) WITH PROPOFOL;  Surgeon: [name], MD;  [facility] ENDOSCOPY;  Service: Endoscopy;  Laterality: N/A;  . EYE SURGERY Bilateral    remove cataracts  . GASTRECTOMY N/A 01/15/2016   Procedure: OPEN PARTIAL GASTRECTOMY;  Surgeon: Stark Klein, MD;  Location: [place];  Service: General;  Laterality: N/A;  . GASTRECTOMY Left 02/24/2016   Procedure: OPEN J TUBE;  Surgeon: Stark Klein, MD;  Location: [place];  Service: General;  Laterality: Left;  [name] GASTROJEJUNOSTOMY N/A 05/18/2016   Procedure: ROUX EN [place];  Surgeon: Stark Klein, MD;  Location: [place];  Service: General;  Laterality: N/A;  . I & D KNEE WITH POLY EXCHANGE Right 12/17/2013   Procedure: IRRIGATION AND DEBRIDEMEN RIGHT TOTAL  KNEE WITH POLY EXCHANGE;  Surgeon: Mauri Pole, MD;  Location: [facility] ORS;  Service: Orthopedics;  Laterality: Right;  . IR GENERIC HISTORICAL  06/17/2016   IR RADIOLOGIST EVAL & MGMT 06/17/2016 Ascencion Dike, PA-C [facility] INTERV RAD  . LAPAROSCOPY N/A  01/15/2016   Procedure: LAPAROSCOPY DIAGNOSTIC;  Surgeon: Stark Klein, MD;  Location: [place];  Service: General;  Laterality: N/A;  . LYSIS OF ADHESION  05/18/2016   Procedure: LYSIS OF ADHESION;  Surgeon: Stark Klein, MD;  Location: [place];  Service: General;;  . NEPHRECTOMY Left    renal cell cancer  . PACEMAKER INSERTION  07/04/2011   MDT PPM implanted by Dr Doreatha Lew for tachy/brady; managed by Dr Thompson Grayer   . PATELLAR TENDON REPAIR Right 03/06/2014   Procedure: AVULSION WITH PATELLA TENDON REPAIR;  Surgeon: Mauri Pole, MD;  [facility];  Service: Orthopedics;  Laterality: Right;  . PATENT DUCTUS ARTERIOUS REPAIR  ~ 1949  . RIGHT/LEFT HEART CATH AND CORONARY ANGIOGRAPHY N/A 10/09/2019   Procedure: RIGHT/LEFT HEART CATH AND CORONARY ANGIOGRAPHY;  Surgeon: [name], Ariahna Smiddy M, MD;  Location: [place] CV LAB;  Service: Cardiovascular;  Laterality: N/A;  . TEE WITHOUT  CARDIOVERSION N/A 10/30/2019   Procedure: TRANSESOPHAGEAL ECHOCARDIOGRAM (TEE);  Surgeon: Burnell Blanks, MD;  Location: [place];  Service: Open Heart Surgery;  Laterality: N/A;  . TOTAL ABDOMINAL HYSTERECTOMY  85  . TOTAL HIP REVISION Right 2009   initial replacment surg  . TOTAL KNEE ARTHROPLASTY  02/07/2012   Procedure: TOTAL KNEE ARTHROPLASTY;  Surgeon: Mauri Pole, MD;  [facility];  Service: Orthopedics;  Laterality: Right;  . TOTAL KNEE REVISION Right 07/29/2014   Procedure: RIGHT KNEE REVISION OF PREVIOUS REPAIR EXTENSOR MECHANISM;  Surgeon: Mauri Pole, MD;  Location: [facility]S;  Service: Orthopedics;  Laterality: Right;  . TRANSCATHETER AORTIC VALVE REPLACEMENT, CAROTID Left 10/30/2019   TRANSCATHETER AORTIC VALVE REPLACEMENT, LEFT CAROTID (Left Neck)   . TRANSCATHETER AORTIC VALVE REPLACEMENT, CAROTID Left 10/30/2019   Procedure: TRANSCATHETER AORTIC VALVE REPLACEMENT, LEFT CAROTID;  Surgeon: Burnell Blanks, MD;  Location: [place];  Service: Open Heart Surgery;  Laterality: Left;  . TRANSURETHRAL RESECTION OF BLADDER TUMOR N/A 07/21/2018   Procedure: TRANSURETHRAL RESECTION OF BLADDER TUMOR (TURBT);  Surgeon: Alexis Frock, MD;  [facility];  Service: Urology;  Laterality: N/A;  . TRANSURETHRAL RESECTION OF BLADDER TUMOR WITH MITOMYCIN-C N/A 05/01/2018   Procedure: TRANSURETHRAL RESECTION OF BLADDER TUMOR WITH POST OP INSTILLATION OF GEMCITABINE;  Surgeon: Lucas Mallow, MD;  [facility];  Service: Urology;  Laterality: N/A;  . WHIPPLE PROCEDURE N/A 05/18/2016   Procedure: EXPLORATORY LAPAROTOMY;  Surgeon: Stark Klein, MD;  Location: [place] OR;  Service: General;  Laterality: N/A;     Current Outpatient Medications  Medication Sig Dispense Refill  . acetaminophen (TYLENOL) 500 MG tablet Take 500-1,000 mg by mouth every 6 (six) hours as needed for mild pain or headache.     . ALPRAZolam (XANAX) 0.5 MG tablet Take 0.5 mg by mouth 2 (two) times daily as  needed for anxiety.     . Alum & Mag Hydroxide-Simeth (MYLANTA PO) Take 20 mLs by mouth every evening.     [name] amLODipine (NORVASC) 5 MG tablet Take 5 mg by mouth daily in the afternoon.     [name] amoxicillin (AMOXIL) 500 MG tablet Take 4 tablets (2,000 mg total) by mouth as directed. 1 hour prior to dental work including cleanings 12 tablet 12  . Ascorbic Acid (VITAMIN C) 1000 MG tablet Take 1,000 mg by mouth daily.    . Biotin 10000 MCG TBDP Take 10,000 mcg by mouth daily.    . Cholecalciferol (VITAMIN D-3) 5000 units TABS Take 5,000 Units by mouth 3 (three) times  daily.     . erythromycin ophthalmic ointment Place 1 application into both eyes at bedtime as needed (stye).     . famotidine (PEPCID) 40 MG tablet Take 1 tablet Daily for Indigestion & Heartburn (Patient taking differently: Take 40 mg by mouth daily. ) 90 tablet 3  . ferrous sulfate 325 (65 FE) MG tablet Take 325 mg by mouth daily in the afternoon.    . furosemide (LASIX) 20 MG tablet Take 1 tablet (20 mg total) by mouth daily. 90 tablet 3  . gabapentin (NEURONTIN) 300 MG capsule Take 300 mg by mouth 2 (two) times daily.    [name] levothyroxine (SYNTHROID) 100 MCG tablet Take 50 mcg by mouth daily before breakfast.    . loperamide (IMODIUM A-D) 2 MG tablet Take up to 12 tablets /day as needed for  for Diarrhea (Patient taking differently: Take 2-4 mg by mouth 4 (four) times daily as needed for diarrhea or loose stools. ) 120 tablet 11  . loratadine (CLARITIN) 10 MG tablet Take 10 mg by mouth at bedtime.     [name] losartan (COZAAR) 25 MG tablet Take 2 tablets (50 mg total) by mouth daily. (Patient taking differently: Take 25 mg by mouth in the morning and at bedtime. ) 360 tablet 1  . metoprolol tartrate (LOPRESSOR) 100 MG tablet Take 100 mg by mouth 2 (two) times daily.    . nitroGLYCERIN (NITROSTAT) 0.4 MG SL tablet Dissolve 1 tablet under tongue every 5 minutes if needed for Angina 50 tablet prn  . ondansetron (ZOFRAN-ODT) 8 MG disintegrating  tablet Take 8 mg by mouth every 8 (eight) hours as needed for nausea or vomiting.     . potassium chloride (KLOR-CON) 10 MEQ tablet Take 1 tablet (10 mEq total) by mouth daily. 90 tablet 3  . Propylene Glycol (SYSTANE COMPLETE) 0.6 % SOLN Place 1 drop into both eyes 3 (three) times daily as needed (irritation/dry eyes.).    [name] simethicone (MYLICON) 80 MG chewable tablet Chew 80 mg by mouth every 6 (six) hours as needed for flatulence.    . sucralfate (CARAFATE) 1 g tablet Take 1 g by mouth 4 (four) times daily.    [name] tobramycin-dexamethasone (TOBRADEX) ophthalmic solution Place 1 drop into both eyes daily as needed (stye).     [name] triamcinolone cream (KENALOG) 0.1 % Apply 1 application topically 2 (two) times daily as needed (rash/skin irritation.).     [name] verapamil (CALAN) 120 MG tablet Take 120 mg by mouth 2 (two) times daily.    [name] warfarin (COUMADIN) 2 MG tablet TAKE 1 TO 2 TABLETS AS DIRECTED TO PREVENT BLOOD CLOTS (Patient taking differently: Take 2 mg by mouth every evening. ) 180 tablet 1   No current facility-administered medications for this visit.    Allergies:   Amiodarone, Adhesive [tape], Hydrocodone, Other, Reglan [metoclopramide], Remeron [mirtazapine], Codeine, Morphine and related, Requip [ropinirole hcl], and Zinc    Social History:  The patient  reports that she has never smoked. She has never used smokeless tobacco. She reports that she does not drink alcohol or use drugs.   Family History:  The patient's family history includes Colon cancer (age of onset: 50) in her mother; Heart attack in her father; Heart disease in her father, maternal grandmother, and mother.    ROS:  Please see the history of present illness.   Otherwise, review of systems are positive for none.   All other systems are reviewed and negative.    PHYSICAL EXAM: VS:  BP (!) 162/64   Pulse 90   Ht 4\' 11"  (1.499 m)   Wt 98 lb 12.8 oz (44.8 kg)   LMP  (LMP Unknown)   BMI 19.96 kg/m  , BMI Body mass index  is 19.96 kg/m. GEN: Well nourished, elderly WF, in no acute distress. Seen in a wheelchair.  HEENT: sclera anicteric Neck: no JVD, carotid bruits, or masses Cardiac: RRR; gr 2/6 diastolic murmur LSB. Respiratory:  clear to auscultation bilaterally, normal work of breathing GI: soft, nontender, nondistended, + BS MS: no deformity or atrophy Skin: warm and dry, no rash, trace edema in feet.  Neuro:  Strength and sensation are intact Psych: euthymic mood, full affect   EKG:  EKG is ordered today. The ekg ordered today demonstrates atrial paced rhythm with rate 90.   chronic inferolateral T wave abnormalities, QTc 457. LVH. I have personally reviewed and interpreted this study.    Recent Labs: 03/02/2019: NT-Pro BNP 10,870 08/08/2019: TSH 2.23 10/26/2019: ALT 26; B Natriuretic Peptide 2,138.2 10/31/2019: Magnesium 1.6 12/03/2019: BUN 20; Creatinine, Ser 1.09; Hemoglobin 13.7; Platelets 146; Potassium 3.7; Sodium 139    Lipid Panel    Component Value Date/Time   CHOL 113 08/08/2019 1030   TRIG 100 08/08/2019 1030   HDL 40 (L) 08/08/2019 1030   CHOLHDL 2.8 08/08/2019 1030   VLDL 29 03/23/2017 1445   LDLCALC 54 08/08/2019 1030      Wt Readings from Last 3 Encounters:  12/06/19 98 lb 12.8 oz (44.8 kg)  12/05/19 96 lb 3.2 oz (43.6 kg)  11/22/19 98 lb 6.4 oz (44.6 kg)      Other studies Reviewed: Additional studies/ records that were reviewed today include:   NST 2017:   The left ventricular ejection fraction is normal (55-65%).  Nuclear stress EF: 62%.  The study is normal.  Normal stress nuclear study with no ischemia or infarction; EF 62 with normal wall motion; note patient in atrial flutter at time of study.  Echocardiogram 09/17/19: 1. Left ventricular ejection fraction, by visual estimation, is 60 to 65%. The left ventricle has normal function. There is moderately increased left ventricular hypertrophy.  2. Left ventricular diastolic function could not be  evaluated.  3. Small left ventricular internal cavity size.  4. The left ventricle has no regional wall motion abnormalities.  5. Global right ventricle has normal systolic function.The right ventricular size is normal. No increase in right ventricular wall thickness.  6. Left atrial size was moderately dilated.  7. The mitral valve is degenerative. Mild mitral valve regurgitation.  8. The tricuspid valve is grossly normal.  9. The aortic valve is severely calcified with restricted leaflet motion. Vmax 3.3 m/s, mean gradient 25 mmHG, DI 0.21, AVA 0.60 cm2. SV=43 cc and SVi=31 cc/m2. Suspect there is paradoxical low-flow low-gradient severe aortic stenosis. Moderate aortic  regurgitation is present. Recommend an aortic valve calcium score for clarification of AoV stenosis severity. 10. Aortic valve regurgitation is moderate. 11. The aortic valve is tricuspid. Aortic valve regurgitation is moderate. Moderate to severe aortic valve stenosis. 12. Mildly elevated pulmonary artery systolic pressure. 13. The tricuspid regurgitant velocity is 2.68 m/s, and with an assumed right atrial pressure of 15 mmHg, the estimated right ventricular systolic pressure is mildly elevated at 43.8 mmHg. 14. A pacer wire is visualized in the RA and RV. 15. The inferior vena cava is dilated in size with <50% respiratory variability, suggesting right atrial pressure of 15 mmHg.  TAVR OPERATIVE NOTE   Date  of Procedure:10/30/2019  Preoperative Diagnosis:Severe Aortic Stenosis   Postoperative Diagnosis:Same   Procedure:   Transcatheter Aortic Valve Replacement - OpenLeftTranscarotid Approach Edwards Sapien 3 THV (size 46mm, model # 9600TFX, serial # [id number])  Co-Surgeons:Clarence H. Roxy Manns, MD and [name], MD  Anesthesiologist:Chris Ermalene Postin, MD  Dala Dock,  MD  Pre-operative Echo Findings: ? Paradoxical Low Flow with Normal EFSevere aortic stenosis ? Normalleft ventricular systolic function  Post-operative Echo Findings: ? Mildparavalvular leak ? Normalleft ventricular systolic function  _____________  Echo2/24/21: IMPRESSIONS 1. Left ventricular ejection fraction, by estimation, is 70 to 75%. The  left ventricle has hyperdynamic function. The left ventricle has no  regional wall motion abnormalities. There is moderate concentric left  ventricular hypertrophy. Left ventricular  diastolic function could not be evaluated.  2. Right ventricular systolic function is normal. The right ventricular  size is normal. There is moderately elevated pulmonary artery systolic  pressure. The estimated right ventricular systolic pressure is [id number] mmHg.  3. Left atrial size was mildly dilated.  4. Right atrial size was mildly dilated.  5. Large pleural effusion in the left lateral region.  6. The mitral valve is normal in structure and function. Moderate mitral  valve regurgitation. No evidence of mitral stenosis.  7. There is mild paravalvular leak (at 5'clock on short axis), most  prominent on images 82, 83 where it appears mild to moderate.   Normal peak/mean transaortic gradients 15/7 mmHg.. The aortic valve  has been repaired/replaced. Aortic valve regurgitation is not visualized.  No aortic stenosis is present. Echo findings are consistent with  perivalvular leak of the aortic prosthesis.  Aortic valve mean gradient measures 7.0 mmHg.  8. The inferior vena cava is normal in size with greater than 50%  respiratory variability, suggesting right atrial pressure of 3 mmHg.   ___________________  Limited echo 11/05/19: IMPRESSIONS 1. Left ventricular ejection fraction, by estimation, is 60 to 65%. The  left ventricle has normal function. The left ventricle has no regional  wall motion abnormalities. There is moderate left  ventricular hypertrophy.  Left ventricular diastolic  parameters are indeterminate.  2. Moderate pleural effusion in the left lateral region.  3. The mitral valve is degenerative. Moderate mitral annular  calcifications. Moderate mitral valve regurgitation.  4. Aortic dilatation noted. There is dilatation of the ascending aorta  measuring 40 mm.  5. Tricuspid valve regurgitation is mild to moderate.  6. Right ventricular systolic function is mildly reduced. The right  ventricular size is normal. There is moderately elevated pulmonary artery  systolic pressure. The estimated right ventricular systolic pressure is  123456 mmHg.  7. The inferior vena cava is normal in size with <50% respiratory  variability, suggesting right atrial pressure of 8 mmHg.  8. There is a 23 mm Edwards Sapien prosthetic (TAVR) valve present in the  aortic position. Procedure Date: 10/30/19. Mean gradient 20mmHg. Echo  findings are consistent with perivalvular leak of the aortic prosthesis.  Paravalvular leak appears  mild-moderate, unchanged from prior TTE on 2/24.   Comparison(s): 10/31/19 EF 70-75%. PA 59mmHg. AV 25mmHg mean, 33mmhg peak. Mild-moderate perivalvular AI.   ___________________  Echo 11/22/19 IMPRESSIONS  1. Left ventricular ejection fraction, by estimation, is 55 to 60%. The  left ventricle has normal function. The left ventricle has no regional  wall motion abnormalities. There is mild concentric left ventricular  hypertrophy. Left ventricular diastolic  parameters are indeterminate. Elevated left ventricular end-diastolic  pressure.  2. Right ventricular systolic function is normal. The right ventricular  size is normal. There is mildly elevated pulmonary artery systolic  pressure.  3. Left atrial size was severely dilated.  4. Right atrial size was severely dilated.  5. The mitral valve is normal in structure. Mild mitral valve  regurgitation. No evidence of mitral stenosis.    6. Mild to moderate perivalvular aortic regurgitation unchanged from  prior. The aortic valve has been repaired/replaced. Aortic valve  regurgitation is mild to moderate. No aortic stenosis is present. There is  a 23 mm Edwards Sapien prosthetic (TAVR)  valve present in the aortic position. Procedure Date: 10/30/19. Aortic  regurgitation PHT measures 358 msec. Aortic valve mean gradient measures  10.0 mmHg. Aortic valve Vmax measures 2.11 m/s.  7. Aortic dilatation noted. There is mild dilatation of the ascending  aorta.  8. The inferior vena cava is normal in size with greater than 50%  respiratory variability, suggesting right atrial pressure of 3 mmHg.    ASSESSMENT AND PLAN:   1. Acute on chronic diastolic CHF: secondary to combined Afib, AS. Weight is down to 96 lbs. She has minimal edema on exam. Recommend continuing on lasix 20 mg daily.  Weight 101.8lbs today up from 99lbs 09/05/19. She reports improvement of symptoms with diuresis and restoration of NSR.    2. Paroxysmal atrial fibrillation:  S/p DCCV yesterday. Follows with the coumadin clinic with INR 2.7 yesterday.  - Continue coumadin for stroke ppx - Continue metoprolol  100mg  BID - Continue verapamil at current dose - she has a higher risk for recurrent Afib. Will need follow up with EP. She has appointment with [place] clinic on April 14. Will keep appointment. If AFib recurs will need Dr Jackalyn Lombard input. She has a history of intolerance to amiodarone.   3. Aortic stenosis. S/p recent TAVR in February:  She has mild to moderate perivalvular lead/AI. Followed by valve clinic.  - on Coumadin due to Afib. No antiplatelet therapy due to needing to be on Coumadin   4. HTN: BP 154/54 by my exam today. While systolic pressure is still elevated I am reluctant to increase medication further given low diastolic reading. Amlodipine just added 12 days ago. Will continue same.  - Continue losartan, metoprolol,  and verapamil    5. Tachy-brady syndrome s/p PPM: device interrogation 08/30/2019 with normal function; Follow up in Afib clinic in 2 weeks.    6. Carotid artery disease: mild stenosis noted on carotid dopplers 08/15/2019 - Recommend annual follow-up doppler studies  7. Thoracic aortic aneurysm. 4.0 cm ectatic aorta. Will follow over time.   8. PAD pre TAVR CT showed severe aortic and ileofemoral atherosclerosis with multifocal stenosis in the right external iliac. She does have some symptoms of leg fatigue with exertion. She had an appointment to see Dr Gwenlyn Found next month but I think it is too soon to pursue this given recent cardiac cath, TAVR, DCCV, CHF. I think we need to wait until she is more fully recovered from her cardiac issues so I will postpone evaluation with Dr Gwenlyn Found for now.     Disposition:   FU with me in 2 months.   Signed, Lamario Mani [name], MD  12/06/2019 5:42 PM

## 2019-12-12 ENCOUNTER — Ambulatory Visit (HOSPITAL_COMMUNITY): Payer: Medicare Other | Admitting: Physician Assistant

## 2019-12-12 ENCOUNTER — Ambulatory Visit (INDEPENDENT_AMBULATORY_CARE_PROVIDER_SITE_OTHER): Payer: Medicare Other

## 2019-12-12 DIAGNOSIS — K219 Gastro-esophageal reflux disease without esophagitis: Secondary | ICD-10-CM | POA: Diagnosis not present

## 2019-12-12 DIAGNOSIS — I480 Paroxysmal atrial fibrillation: Secondary | ICD-10-CM | POA: Diagnosis not present

## 2019-12-12 DIAGNOSIS — Z7901 Long term (current) use of anticoagulants: Secondary | ICD-10-CM

## 2019-12-12 DIAGNOSIS — I081 Rheumatic disorders of both mitral and tricuspid valves: Secondary | ICD-10-CM | POA: Diagnosis not present

## 2019-12-12 DIAGNOSIS — N183 Chronic kidney disease, stage 3 unspecified: Secondary | ICD-10-CM | POA: Diagnosis not present

## 2019-12-12 DIAGNOSIS — G2581 Restless legs syndrome: Secondary | ICD-10-CM | POA: Diagnosis not present

## 2019-12-12 DIAGNOSIS — Z85038 Personal history of other malignant neoplasm of large intestine: Secondary | ICD-10-CM | POA: Diagnosis not present

## 2019-12-12 DIAGNOSIS — G5791 Unspecified mononeuropathy of right lower limb: Secondary | ICD-10-CM | POA: Diagnosis not present

## 2019-12-12 DIAGNOSIS — Z48812 Encounter for surgical aftercare following surgery on the circulatory system: Secondary | ICD-10-CM | POA: Diagnosis not present

## 2019-12-12 DIAGNOSIS — I4821 Permanent atrial fibrillation: Secondary | ICD-10-CM | POA: Diagnosis not present

## 2019-12-12 DIAGNOSIS — E559 Vitamin D deficiency, unspecified: Secondary | ICD-10-CM | POA: Diagnosis not present

## 2019-12-12 DIAGNOSIS — J301 Allergic rhinitis due to pollen: Secondary | ICD-10-CM | POA: Diagnosis not present

## 2019-12-12 DIAGNOSIS — I130 Hypertensive heart and chronic kidney disease with heart failure and stage 1 through stage 4 chronic kidney disease, or unspecified chronic kidney disease: Secondary | ICD-10-CM | POA: Diagnosis not present

## 2019-12-12 DIAGNOSIS — Z950 Presence of cardiac pacemaker: Secondary | ICD-10-CM | POA: Diagnosis not present

## 2019-12-12 DIAGNOSIS — I2510 Atherosclerotic heart disease of native coronary artery without angina pectoris: Secondary | ICD-10-CM | POA: Diagnosis not present

## 2019-12-12 DIAGNOSIS — I5033 Acute on chronic diastolic (congestive) heart failure: Secondary | ICD-10-CM | POA: Diagnosis not present

## 2019-12-12 DIAGNOSIS — Z5181 Encounter for therapeutic drug level monitoring: Secondary | ICD-10-CM | POA: Diagnosis not present

## 2019-12-12 DIAGNOSIS — H5462 Unqualified visual loss, left eye, normal vision right eye: Secondary | ICD-10-CM | POA: Diagnosis not present

## 2019-12-12 DIAGNOSIS — I495 Sick sinus syndrome: Secondary | ICD-10-CM | POA: Diagnosis not present

## 2019-12-12 DIAGNOSIS — K3184 Gastroparesis: Secondary | ICD-10-CM | POA: Diagnosis not present

## 2019-12-12 DIAGNOSIS — E039 Hypothyroidism, unspecified: Secondary | ICD-10-CM | POA: Diagnosis not present

## 2019-12-12 DIAGNOSIS — E875 Hyperkalemia: Secondary | ICD-10-CM | POA: Diagnosis not present

## 2019-12-12 DIAGNOSIS — I272 Pulmonary hypertension, unspecified: Secondary | ICD-10-CM | POA: Diagnosis not present

## 2019-12-12 DIAGNOSIS — H6523 Chronic serous otitis media, bilateral: Secondary | ICD-10-CM | POA: Diagnosis not present

## 2019-12-12 DIAGNOSIS — Z952 Presence of prosthetic heart valve: Secondary | ICD-10-CM | POA: Diagnosis not present

## 2019-12-12 LAB — POCT INR: INR: 2.3 (ref 2.0–3.0)

## 2019-12-12 NOTE — Patient Instructions (Signed)
Description   Spoke with Tab[facility]a [facility] RN while in pt's home, advised to have pt continue on same dosage 2mg  daily. Recheck INR in 2 weeks. Call us with any medication changes or concerns # [phone]. [place]'s # [phone]

## 2019-12-18 ENCOUNTER — Telehealth: Payer: Self-pay | Admitting: Physician Assistant

## 2019-12-18 NOTE — Telephone Encounter (Signed)
Paged by daughter [name], Pt BP is 95/54 HR 82 about a hour ago and 100/54 HR 63. Feeling weak. Advised to hold all antihypertensive. Will start metoprolol  If SBP above 110.

## 2019-12-19 ENCOUNTER — Encounter: Payer: Self-pay | Admitting: Student

## 2019-12-19 ENCOUNTER — Ambulatory Visit: Payer: Medicare Other | Admitting: Student

## 2019-12-19 ENCOUNTER — Other Ambulatory Visit: Payer: Self-pay

## 2019-12-19 VITALS — BP 110/50 | HR 68 | Ht 59.0 in | Wt 94.6 lb

## 2019-12-19 DIAGNOSIS — I4821 Permanent atrial fibrillation: Secondary | ICD-10-CM

## 2019-12-19 DIAGNOSIS — I495 Sick sinus syndrome: Secondary | ICD-10-CM

## 2019-12-19 DIAGNOSIS — I350 Nonrheumatic aortic (valve) stenosis: Secondary | ICD-10-CM

## 2019-12-19 DIAGNOSIS — I10 Essential (primary) hypertension: Secondary | ICD-10-CM | POA: Diagnosis not present

## 2019-12-19 DIAGNOSIS — I5032 Chronic diastolic (congestive) heart failure: Secondary | ICD-10-CM

## 2019-12-19 LAB — CUP PACEART INCLINIC DEVICE CHECK
Battery Impedance: 2253 Ohm
Battery Remaining Longevity: 31 mo
Battery Voltage: 2.76 V
Brady Statistic AP VP Percent: 1 %
Brady Statistic AP VS Percent: 59 %
Brady Statistic AS VP Percent: 0 %
Brady Statistic AS VS Percent: 40 %
Date Time Interrogation Session: 20210414114442
Implantable Lead Implant Date: 20111027
Implantable Lead Implant Date: 20111027
Implantable Lead Location: 753859
Implantable Lead Location: 753860
Implantable Lead Model: 4469
Implantable Lead Model: 4470
Implantable Lead Serial Number: 548229
Implantable Lead Serial Number: 686026
Implantable Pulse Generator Implant Date: 20111027
Lead Channel Impedance Value: 408 Ohm
Lead Channel Impedance Value: 480 Ohm
Lead Channel Pacing Threshold Amplitude: 0.375 V
Lead Channel Pacing Threshold Amplitude: 1 V
Lead Channel Pacing Threshold Amplitude: 1.25 V
Lead Channel Pacing Threshold Amplitude: 1.625 V
Lead Channel Pacing Threshold Amplitude: 2 V
Lead Channel Pacing Threshold Pulse Width: 0.34 ms
Lead Channel Pacing Threshold Pulse Width: 0.4 ms
Lead Channel Pacing Threshold Pulse Width: 0.4 ms
Lead Channel Pacing Threshold Pulse Width: 0.4 ms
Lead Channel Pacing Threshold Pulse Width: 0.4 ms
Lead Channel Sensing Intrinsic Amplitude: 2.8 mV
Lead Channel Sensing Intrinsic Amplitude: 4 mV
Lead Channel Setting Pacing Amplitude: 2 V
Lead Channel Setting Pacing Amplitude: 3.25 V
Lead Channel Setting Pacing Pulse Width: 0.4 ms
Lead Channel Setting Sensing Sensitivity: 2 mV

## 2019-12-19 LAB — BASIC METABOLIC PANEL
BUN/Creatinine Ratio: 17 (ref 12–28)
BUN: 17 mg/dL (ref 8–27)
CO2: 26 mmol/L (ref 20–29)
Calcium: 8.7 mg/dL (ref 8.7–10.3)
Chloride: 104 mmol/L (ref 96–106)
Creatinine, Ser: 1.02 mg/dL — ABNORMAL HIGH (ref 0.57–1.00)
GFR calc Af Amer: 59 mL/min/{1.73_m2} — ABNORMAL LOW (ref >59)
GFR calc non Af Amer: 51 mL/min/{1.73_m2} — ABNORMAL LOW (ref >59)
Glucose: 75 mg/dL (ref 65–99)
Potassium: 3.9 mmol/L (ref 3.5–5.2)
Sodium: 142 mmol/L (ref 134–144)

## 2019-12-19 NOTE — Progress Notes (Signed)
Electrophysiology Office Note Date: 12/19/2019  ID:  [name], DOB 06-12-1934, MRN [id number]  PCP: [name], MD Primary Cardiologist: [name], MD Electrophysiologist: Thompson Grayer, MD   CC: Pacemaker follow-up  [name] is a 86 y.o. female seen today for Thompson Grayer, MD for routine electrophysiology followup.  She had TAVR in February, and has struggled somewhat with recovery. Post operative course complicated by AF and diastolic CHF. Today, she is euvolemic and in normal sinus, and gradually feeling better. Last night she had an episode of lightheadedness and home and BP low into 90s per daughter. Called [facility] and meds held last night, no adjustments with visit today.  She felt much better this am and took her metoprolol as scheduled. Her edema has been well controlled on lasix. She denies lightheadedness or dizziness with standing today. She denies chest pain, dyspnea, PND, orthopnea, nausea, vomiting, syncope, edema, weight gain, or early satiety.  Device History: Medtronic Dual Chamber PPM implanted 07/02/2010 for SSS/Tachy-brady  Past Medical History:  Diagnosis Date  . Adenocarcinoma of stomach ([place]) 11/11/2015   gastric mass on egd,   . Anal fissure   . Anemia    hx 3/17 iron infusion, on aranesp and injected B12 since 11/2015.   [name] Anxiety   . Aortic root dilatation ([place])   . Arthritis    oa  . Blind left eye 2015   partial blind in left eye due to stroke   . 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 CAD (coronary artery disease)   . Cancer (HCC)    hx bladder, kidney, uterus  . Cataracts, bilateral   . Chronic kidney disease    only has one kidney rt lft rem 03 ca     dr. Risa Grill, bladder cancer 05-04-2018  . Elevated LFTs    hepatic steatosis, none recent  . Gastroparesis   . GERD (gastroesophageal reflux disease)   . Hearing loss    wears hearing aids  . History of uterine cancer 1980s   treated with hysterectomy, external radiation and radiation seed  implants.   [name] HTN (hypertension)   . Hyperlipemia   . Hypothyroidism   . Iron deficiency anemia due to chronic blood loss 11/24/2015  . Neuropathy    right leg  . Osteomyelitis ([place]) as child  . PAF (paroxysmal atrial fibrillation) ([place])   . Presence of permanent cardiac pacemaker   . Radiation proctitis   . Refusal of blood transfusions as patient is Jehovah's Witness   . Restless leg syndrome   . 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 S/P TAVR (transcatheter aortic valve replacement)   . Severe aortic stenosis   . Stroke [facility]) 2015   partial stroke left legally blind; left eye   . Tachycardia-bradycardia syndrome ([place])    s/p PPM by Dr Doreatha Lew (MDT) 07/03/10  . Walker as ambulation aid   . Wears glasses   . Wears partial dentures    upper   Past Surgical History:  Procedure Laterality Date  . CARDIAC CATHETERIZATION  2008  . CARDIOVERSION N/A 12/05/2019   Procedure: CARDIOVERSION;  Surgeon: Elouise Munroe, MD;  Location: [facility] ENDOSCOPY;  Service: Cardiovascular;  Laterality: N/A;  . CHOLECYSTECTOMY  1970s  . COLONOSCOPY N/A 11/11/2015   Procedure: COLONOSCOPY;  Surgeon: [name], MD;  Location: [facility] ENDOSCOPY;  Service: Endoscopy;  Laterality: N/A;  . CYSTOSCOPY  W/ RETROGRADES Right 05/01/2018   Procedure: CYSTOSCOPY WITH RIGHT RETROGRADE PYELOGRAM;  Surgeon: Lucas Mallow, MD;  Location: [facility] ORS;  Service: Urology;  Laterality: Right;  . CYSTOSCOPY W/ RETROGRADES Right 07/21/2018   Procedure: CYSTOSCOPY WITH RIGHT RETROGRADE PYELOGRAM;  Surgeon: Alexis Frock, MD;  [facility]: [facility] ORS;  Service: Urology;  Laterality: Right;  . ESOPHAGOGASTRODUODENOSCOPY N/A 11/11/2015   Procedure: ESOPHAGOGASTRODUODENOSCOPY (EGD);  Surgeon: [name], MD;  Location: [facility] ENDOSCOPY;  Service: Endoscopy;  Laterality: N/A;  . ESOPHAGOGASTRODUODENOSCOPY N/A 02/05/2016    Procedure: ESOPHAGOGASTRODUODENOSCOPY (EGD);  Surgeon: Ladene Artist, MD;  Location: [facility] ENDOSCOPY;  Service: Endoscopy;  Laterality: N/A;  . ESOPHAGOGASTRODUODENOSCOPY N/A 04/02/2016   Procedure: ESOPHAGOGASTRODUODENOSCOPY (EGD);  Surgeon: [name], MD;  Location: [facility] ENDOSCOPY;  Service: Endoscopy;  Laterality: N/A;  . ESOPHAGOGASTRODUODENOSCOPY (EGD) WITH PROPOFOL N/A 04/06/2017   Procedure: ESOPHAGOGASTRODUODENOSCOPY (EGD) WITH PROPOFOL;  Surgeon: [name], MD;  [facility] ENDOS[facility];  Service: Endoscopy;  Laterality: N/A;  . EYE SURGERY Bilateral    remove cataracts  . GASTRECTOMY N/A 01/15/2016   Procedure: OPEN PARTIAL GASTRECTOMY;  Surgeon: Stark Klein, MD;  Location: [place];  Service: General;  Laterality: N/A;  . GASTRECTOMY Left 02/24/2016   Procedure: OPEN J TUBE;  Surgeon: Stark Klein, MD;  Location: [place];  Service: General;  Laterality: Left;  [name] GASTROJEJUNOSTOMY N/A 05/18/2016   Procedure: ROUX EN [place];  Surgeon: Stark Klein, MD;  Location: [place];  Service: General;  Laterality: N/A;  . I & D KNEE WITH POLY EXCHANGE Right 12/17/2013   Procedure: IRRIGATION AND DEBRIDEMEN RIGHT TOTAL  KNEE WITH POLY EXCHANGE;  Surgeon: Mauri Pole, MD;  [facility]S;  Service: Orthopedics;  Laterality: Right;  . IR GENERIC HISTORICAL  06/17/2016   IR RADIOLOGIST EVAL & MGMT 06/17/2016 Ascencion Dike, PA-C [facility] INTERV RAD  . LAPAROSCOPY N/A 01/15/2016   Procedure: LAPAROSCOPY DIAGNOSTIC;  Surgeon: Stark Klein, MD;  Location: [place];  Service: General;  Laterality: N/A;  . LYSIS OF ADHESION  05/18/2016   Procedure: LYSIS OF ADHESION;  Surgeon: Stark Klein, MD;  Location: [place];  Service: General;;  . NEPHRECTOMY Left    renal cell cancer  . PACEMAKER INSERTION  07/04/2011   MDT PPM implanted by Dr Doreatha Lew for tachy/brady; managed by Dr Thompson Grayer   . PATELLAR TENDON REPAIR Right 03/06/2014   Procedure: AVULSION WITH PATELLA TENDON REPAIR;  Surgeon: Mauri Pole,  MD;  [facility];  Service: Orthopedics;  Laterality: Right;  . PATENT DUCTUS ARTERIOUS REPAIR  ~ 1949  . RIGHT/LEFT HEART CATH AND CORONARY ANGIOGRAPHY N/A 10/09/2019   Procedure: RIGHT/LEFT HEART CATH AND CORONARY ANGIOGRAPHY;  Surgeon: [name], [name], MD;  Location: [place] CV LAB;  Service: Cardiovascular;  Laterality: N/A;  . TEE WITHOUT CARDIOVERSION N/A 10/30/2019   Procedure: TRANSESOPHAGEAL ECHOCARDIOGRAM (TEE);  Surgeon: Burnell Blanks, MD;  Location: [place];  Service: Open Heart Surgery;  Laterality: N/A;  . TOTAL ABDOMINAL HYSTERECTOMY  85  . TOTAL HIP REVISION Right 2009   initial replacment surg  . TOTAL KNEE ARTHROPLASTY  02/07/2012   Procedure: TOTAL KNEE ARTHROPLASTY;  Surgeon: Mauri Pole, MD;  [facility];  Service: Orthopedics;  Laterality: Right;  . TOTAL KNEE REVISION Right 07/29/2014   Procedure: RIGHT KNEE REVISION OF PREVIOUS REPAIR EXTENSOR MECHANISM;  Surgeon: Mauri Pole, MD;  [facility];  Service: Orthopedics;  Laterality: Right;  . TRANSCATHETER AORTIC VALVE REPLACEMENT, CAROTID Left  10/30/2019   TRANSCATHETER AORTIC VALVE REPLACEMENT, LEFT CAROTID (Left Neck)   . TRANSCATHETER AORTIC VALVE REPLACEMENT, CAROTID Left 10/30/2019   Procedure: TRANSCATHETER AORTIC VALVE REPLACEMENT, LEFT CAROTID;  Surgeon: Burnell Blanks, MD;  Location: [place];  Service: Open Heart Surgery;  Laterality: Left;  . TRANSURETHRAL RESECTION OF BLADDER TUMOR N/A 07/21/2018   Procedure: TRANSURETHRAL RESECTION OF BLADDER TUMOR (TURBT);  Surgeon: Alexis Frock, MD;  [facility];  Service: Urology;  Laterality: N/A;  . TRANSURETHRAL RESECTION OF BLADDER TUMOR WITH MITOMYCIN-C N/A 05/01/2018   Procedure: TRANSURETHRAL RESECTION OF BLADDER TUMOR WITH POST OP INSTILLATION OF GEMCITABINE;  Surgeon: Lucas Mallow, MD;  [facility];  Service: Urology;  Laterality: N/A;  . WHIPPLE PROCEDURE N/A 05/18/2016   Procedure: EXPLORATORY LAPAROTOMY;   Surgeon: Stark Klein, MD;  Location: [place] OR;  Service: General;  Laterality: N/A;    Current Outpatient Medications  Medication Sig Dispense Refill  . acetaminophen (TYLENOL) 500 MG tablet Take 500-1,000 mg by mouth every 6 (six) hours as needed for mild pain or headache.     . ALPRAZolam (XANAX) 0.5 MG tablet Take 0.5 mg by mouth 2 (two) times daily as needed for anxiety.     . Alum & Mag Hydroxide-Simeth (MYLANTA PO) Take 20 mLs by mouth every evening.     [name] amoxicillin (AMOXIL) 500 MG tablet Take 4 tablets (2,000 mg total) by mouth as directed. 1 hour prior to dental work including cleanings 12 tablet 12  . Ascorbic Acid (VITAMIN C) 1000 MG tablet Take 1,000 mg by mouth daily.    . Biotin 10000 MCG TBDP Take 10,000 mcg by mouth daily.    . Cholecalciferol (VITAMIN D-3) 5000 units TABS Take 5,000 Units by mouth 3 (three) times daily.     [name] erythromycin ophthalmic ointment Place 1 application into both eyes at bedtime as needed (stye).     . famotidine (PEPCID) 40 MG tablet Take 1 tablet Daily for Indigestion & Heartburn 90 tablet 3  . ferrous sulfate 325 (65 FE) MG tablet Take 325 mg by mouth daily in the afternoon.    . furosemide (LASIX) 20 MG tablet Take 1 tablet (20 mg total) by mouth daily. 90 tablet 3  . gabapentin (NEURONTIN) 300 MG capsule Take 300 mg by mouth 2 (two) times daily.    [name] levothyroxine (SYNTHROID) 100 MCG tablet Take 50 mcg by mouth daily before breakfast.    . loperamide (IMODIUM A-D) 2 MG tablet Take up to 12 tablets /day as needed for  for Diarrhea 120 tablet 11  . loratadine (CLARITIN) 10 MG tablet Take 10 mg by mouth at bedtime.     [name] losartan (COZAAR) 25 MG tablet Take 2 tablets (50 mg total) by mouth daily. 360 tablet 1  . metoprolol tartrate (LOPRESSOR) 100 MG tablet Take 100 mg by mouth 2 (two) times daily.    . nitroGLYCERIN (NITROSTAT) 0.4 MG SL tablet Dissolve 1 tablet under tongue every 5 minutes if needed for Angina 50 tablet prn  . ondansetron  (ZOFRAN-ODT) 8 MG disintegrating tablet Take 8 mg by mouth every 8 (eight) hours as needed for nausea or vomiting.     . potassium chloride (KLOR-CON) 10 MEQ tablet Take 1 tablet (10 mEq total) by mouth daily. 90 tablet 3  . Propylene Glycol (SYSTANE COMPLETE) 0.6 % SOLN Place 1 drop into both eyes 3 (three) times daily as needed (irritation/dry eyes.).    [name] simethicone (MYLICON) 80 MG chewable tablet Chew  80 mg by mouth every 6 (six) hours as needed for flatulence.    . sucralfate (CARAFATE) 1 g tablet Take 1 g by mouth 4 (four) times daily.    [name] tobramycin-dexamethasone (TOBRADEX) ophthalmic solution Place 1 drop into both eyes daily as needed (stye).     [name] triamcinolone cream (KENALOG) 0.1 % Apply 1 application topically 2 (two) times daily as needed (rash/skin irritation.).     [name] verapamil (CALAN) 120 MG tablet Take 120 mg by mouth 2 (two) times daily.    [name] warfarin (COUMADIN) 2 MG tablet TAKE 1 TO 2 TABLETS AS DIRECTED TO PREVENT BLOOD CLOTS 180 tablet 1   No current facility-administered medications for this visit.    Allergies:   Amiodarone, Adhesive [tape], Hydrocodone, Other, Reglan [metoclopramide], Remeron [mirtazapine], Codeine, Morphine and related, Requip [ropinirole hcl], and Zinc   Social History: Social History   Socioeconomic History  . Marital status: Widowed    Spouse name: Not on file  . Number of children: 3  . Years of education: Not on file  . Highest education level: Not on file  Occupational History  . Not on file  Tobacco Use  . Smoking status: Never Smoker  . Smokeless tobacco: Never Used  Substance and Sexual Activity  . Alcohol use: No  . Drug use: No  . Sexual activity: Never    Comment: Hysterectomy  Other Topics Concern  . Not on file  Social History Narrative   01/01/19 Lives in [place] w/dgtr, [name].  Continues to work.   Social Determinants of Health   Financial Resource Strain:   . Difficulty of Paying Living Expenses:   Food  Insecurity:   . Worried About Charity fundraiser in the Last Year:   . Arboriculturist in the Last Year:   Transportation Needs:   . Film/video editor (Medical):   [name] Lack of Transportation (Non-Medical):   Physical Activity:   . Days of Exercise per Week:   . Minutes of Exercise per Session:   Stress:   . Feeling of Stress :   Social Connections:   . Frequency of Communication with Friends and Family:   . Frequency of Social Gatherings with Friends and Family:   . Attends Religious Services:   . Active Member of Clubs or Organizations:   . Attends Archivist Meetings:   [name] Marital Status:   Intimate Partner Violence:   . Fear of Current or Ex-Partner:   . Emotionally Abused:   [name] Physically Abused:   . Sexually Abused:    Family History: Family History  Problem Relation Age of Onset  . Colon cancer Mother 90  . Heart disease Mother   . Heart disease Father   . Heart attack Father   . Heart disease Maternal Grandmother     Review of Systems: All other systems reviewed and are otherwise negative except as noted above.  Physical Exam: Vitals:   12/19/19 1059  BP: (!) 110/50  Pulse: 68  SpO2: 98%  Weight: 94 lb 9.6 oz (42.9 kg)  Height: 4\' 11"  (1.499 m)     GEN- The patient is elderly appearing, alert and oriented x 3 today.   HEENT: normocephalic, atraumatic; sclera clear, conjunctiva pink; hearing intact; oropharynx clear; neck supple  Lungs- Clear to ausculation bilaterally, normal work of breathing.  No wheezes, rales, rhonchi Heart- Regular rate and rhythm, no murmurs, rubs or gallops  GI- soft, non-tender, non-distended, bowel sounds present  Extremities- no clubbing, cyanosis, or edema  MS- no significant deformity or atrophy Skin- warm and dry, no rash or lesion; PPM pocket well healed Psych- euthymic mood, full affect Neuro- strength and sensation are intact  PPM Interrogation- reviewed in detail today,  See PACEART report  EKG:  EKG is  not ordered today. The ekg ordered 12/06/2019 shows A-paced rhythm at 90 bpm, prolonged PR interval at 310 ms. Personally reviewed.  Recent Labs: 03/02/2019: NT-Pro BNP 10,870 08/08/2019: TSH 2.23 10/26/2019: ALT 26; B Natriuretic Peptide 2,138.2 10/31/2019: Magnesium 1.6 12/03/2019: BUN 20; Creatinine, Ser 1.09; Hemoglobin 13.7; Platelets 146; Potassium 3.7; Sodium 139   Wt Readings from Last 3 Encounters:  12/19/19 94 lb 9.6 oz (42.9 kg)  12/06/19 98 lb 12.8 oz (44.8 kg)  12/05/19 96 lb 3.2 oz (43.6 kg)     Other studies Reviewed: Additional studies/ records that were reviewed today include: Previous EP office notes, Previous remote checks, Most recent labwork.   Assessment and Plan:  1. Sick sinus syndrome s/p Medtronic PPM  Normal PPM function See Pace Art report No changes today  2. Paroxysmal AF Recent DCCV 3/31. She has had breakthrough AF in the setting of recent TAVR. Today remains in NSR. Continue verapamil 120 mg daily Can consider amiodarone if she has recurrent AF.  Continue coumadin. INR has been stable  3. Aortic valve stenosis s/p TAVR 10/30/2019 Post op course complicated by diastolic CHF and PAF exacerbation Improved with diuresis and DCCV Follow up with Dr. [name].  4. HTN Now overcorrected Most recent addition is amlodipine, so will stop this for now with her other medications giving benefit for her AF and structural heart disease.  BMET to make sure not diuresed, as lasix has also been increased s/p TAVR. She was in AF before and after surgery, so she may not need as much lasix in NSR now.    Current medicines are reviewed at length with the patient today.   The patient does not have concerns regarding her medicines.  The following changes were made today:  Stop amlodipine. Consider lasix adjustment pending labwork.   Labs/ tests ordered today include:  Orders Placed This Encounter  Procedures  . Basic Metabolic Panel (BMET)   Disposition:   Follow up  with Dr. Rayann Heman in 12 months. Sooner as needed.   Jacalyn Lefevre, PA-C  12/19/2019 11:50 AM  [facility] [address] [place]  [place] [address] [phone] (office) [phone] (fax)

## 2019-12-19 NOTE — Patient Instructions (Addendum)
Medication Instructions:  STOP AMLODIPINE *If you need a refill on your cardiac medications before your next appointment, please call your pharmacy*   Lab Work:  TODAY BMET If you have labs (blood work) drawn today and your tests are completely normal, you will receive your results only by: [name] MyChart Message (if you have MyChart) OR . A paper copy in the mail If you have any lab test that is abnormal or we need to change your treatment, we will call you to review the results.   Testing/Procedures: none   Follow-Up: At [facility], you and your health needs are our priority.  As part of our continuing mission to provide you with exceptional heart care, we have created designated Provider Care Teams.  These Care Teams include your primary Cardiologist (physician) and Advanced Practice Providers (APPs -  Physician Assistants and Nurse Practitioners) who all work together to provide you with the care you need, when you need it.    Your next appointment:   1 YEAR  The format for your next appointment:   In PERSON  Provider:   Oda Kilts, PA   Other Instructions Remote monitoring is used to monitor your Pacemaker from home. This monitoring reduces the number of office visits required to check your device to one time per year. It allows [place] to keep an eye on the functioning of your device to ensure it is working properly. You are scheduled for a device check from home on 02/28/20. You may send your transmission at any time that day. If you have a wireless device, the transmission will be sent automatically. After your physician reviews your transmission, you will receive a postcard with your next transmission date.

## 2019-12-21 DIAGNOSIS — I2510 Atherosclerotic heart disease of native coronary artery without angina pectoris: Secondary | ICD-10-CM | POA: Diagnosis not present

## 2019-12-21 DIAGNOSIS — Z85038 Personal history of other malignant neoplasm of large intestine: Secondary | ICD-10-CM | POA: Diagnosis not present

## 2019-12-21 DIAGNOSIS — Z950 Presence of cardiac pacemaker: Secondary | ICD-10-CM | POA: Diagnosis not present

## 2019-12-21 DIAGNOSIS — Z7901 Long term (current) use of anticoagulants: Secondary | ICD-10-CM | POA: Diagnosis not present

## 2019-12-21 DIAGNOSIS — K3184 Gastroparesis: Secondary | ICD-10-CM | POA: Diagnosis not present

## 2019-12-21 DIAGNOSIS — I272 Pulmonary hypertension, unspecified: Secondary | ICD-10-CM | POA: Diagnosis not present

## 2019-12-21 DIAGNOSIS — Z952 Presence of prosthetic heart valve: Secondary | ICD-10-CM | POA: Diagnosis not present

## 2019-12-21 DIAGNOSIS — G5791 Unspecified mononeuropathy of right lower limb: Secondary | ICD-10-CM | POA: Diagnosis not present

## 2019-12-21 DIAGNOSIS — G2581 Restless legs syndrome: Secondary | ICD-10-CM | POA: Diagnosis not present

## 2019-12-21 DIAGNOSIS — K219 Gastro-esophageal reflux disease without esophagitis: Secondary | ICD-10-CM | POA: Diagnosis not present

## 2019-12-21 DIAGNOSIS — J301 Allergic rhinitis due to pollen: Secondary | ICD-10-CM | POA: Diagnosis not present

## 2019-12-21 DIAGNOSIS — E875 Hyperkalemia: Secondary | ICD-10-CM | POA: Diagnosis not present

## 2019-12-21 DIAGNOSIS — H6523 Chronic serous otitis media, bilateral: Secondary | ICD-10-CM | POA: Diagnosis not present

## 2019-12-21 DIAGNOSIS — N183 Chronic kidney disease, stage 3 unspecified: Secondary | ICD-10-CM | POA: Diagnosis not present

## 2019-12-21 DIAGNOSIS — H5462 Unqualified visual loss, left eye, normal vision right eye: Secondary | ICD-10-CM | POA: Diagnosis not present

## 2019-12-21 DIAGNOSIS — I495 Sick sinus syndrome: Secondary | ICD-10-CM | POA: Diagnosis not present

## 2019-12-21 DIAGNOSIS — E559 Vitamin D deficiency, unspecified: Secondary | ICD-10-CM | POA: Diagnosis not present

## 2019-12-21 DIAGNOSIS — E039 Hypothyroidism, unspecified: Secondary | ICD-10-CM | POA: Diagnosis not present

## 2019-12-21 DIAGNOSIS — I5033 Acute on chronic diastolic (congestive) heart failure: Secondary | ICD-10-CM | POA: Diagnosis not present

## 2019-12-21 DIAGNOSIS — Z48812 Encounter for surgical aftercare following surgery on the circulatory system: Secondary | ICD-10-CM | POA: Diagnosis not present

## 2019-12-21 DIAGNOSIS — I081 Rheumatic disorders of both mitral and tricuspid valves: Secondary | ICD-10-CM | POA: Diagnosis not present

## 2019-12-21 DIAGNOSIS — I480 Paroxysmal atrial fibrillation: Secondary | ICD-10-CM | POA: Diagnosis not present

## 2019-12-21 DIAGNOSIS — I130 Hypertensive heart and chronic kidney disease with heart failure and stage 1 through stage 4 chronic kidney disease, or unspecified chronic kidney disease: Secondary | ICD-10-CM | POA: Diagnosis not present

## 2019-12-21 DIAGNOSIS — Z5181 Encounter for therapeutic drug level monitoring: Secondary | ICD-10-CM | POA: Diagnosis not present

## 2019-12-26 ENCOUNTER — Ambulatory Visit: Payer: Medicare Other | Admitting: Cardiovascular Disease

## 2019-12-26 ENCOUNTER — Ambulatory Visit (INDEPENDENT_AMBULATORY_CARE_PROVIDER_SITE_OTHER): Payer: Medicare Other | Admitting: Pharmacist

## 2019-12-26 DIAGNOSIS — I4821 Permanent atrial fibrillation: Secondary | ICD-10-CM | POA: Diagnosis not present

## 2019-12-26 DIAGNOSIS — Z7901 Long term (current) use of anticoagulants: Secondary | ICD-10-CM | POA: Diagnosis not present

## 2019-12-26 DIAGNOSIS — I081 Rheumatic disorders of both mitral and tricuspid valves: Secondary | ICD-10-CM | POA: Diagnosis not present

## 2019-12-26 DIAGNOSIS — Z5181 Encounter for therapeutic drug level monitoring: Secondary | ICD-10-CM | POA: Diagnosis not present

## 2019-12-26 DIAGNOSIS — I272 Pulmonary hypertension, unspecified: Secondary | ICD-10-CM | POA: Diagnosis not present

## 2019-12-26 DIAGNOSIS — K219 Gastro-esophageal reflux disease without esophagitis: Secondary | ICD-10-CM | POA: Diagnosis not present

## 2019-12-26 DIAGNOSIS — G5791 Unspecified mononeuropathy of right lower limb: Secondary | ICD-10-CM | POA: Diagnosis not present

## 2019-12-26 DIAGNOSIS — I495 Sick sinus syndrome: Secondary | ICD-10-CM | POA: Diagnosis not present

## 2019-12-26 DIAGNOSIS — H5462 Unqualified visual loss, left eye, normal vision right eye: Secondary | ICD-10-CM | POA: Diagnosis not present

## 2019-12-26 DIAGNOSIS — Z952 Presence of prosthetic heart valve: Secondary | ICD-10-CM | POA: Diagnosis not present

## 2019-12-26 DIAGNOSIS — I5033 Acute on chronic diastolic (congestive) heart failure: Secondary | ICD-10-CM | POA: Diagnosis not present

## 2019-12-26 DIAGNOSIS — E875 Hyperkalemia: Secondary | ICD-10-CM | POA: Diagnosis not present

## 2019-12-26 DIAGNOSIS — Z85038 Personal history of other malignant neoplasm of large intestine: Secondary | ICD-10-CM | POA: Diagnosis not present

## 2019-12-26 DIAGNOSIS — J301 Allergic rhinitis due to pollen: Secondary | ICD-10-CM | POA: Diagnosis not present

## 2019-12-26 DIAGNOSIS — K3184 Gastroparesis: Secondary | ICD-10-CM | POA: Diagnosis not present

## 2019-12-26 DIAGNOSIS — Z950 Presence of cardiac pacemaker: Secondary | ICD-10-CM | POA: Diagnosis not present

## 2019-12-26 DIAGNOSIS — Z48812 Encounter for surgical aftercare following surgery on the circulatory system: Secondary | ICD-10-CM | POA: Diagnosis not present

## 2019-12-26 DIAGNOSIS — I130 Hypertensive heart and chronic kidney disease with heart failure and stage 1 through stage 4 chronic kidney disease, or unspecified chronic kidney disease: Secondary | ICD-10-CM | POA: Diagnosis not present

## 2019-12-26 DIAGNOSIS — E559 Vitamin D deficiency, unspecified: Secondary | ICD-10-CM | POA: Diagnosis not present

## 2019-12-26 DIAGNOSIS — N183 Chronic kidney disease, stage 3 unspecified: Secondary | ICD-10-CM | POA: Diagnosis not present

## 2019-12-26 DIAGNOSIS — G2581 Restless legs syndrome: Secondary | ICD-10-CM | POA: Diagnosis not present

## 2019-12-26 DIAGNOSIS — H6523 Chronic serous otitis media, bilateral: Secondary | ICD-10-CM | POA: Diagnosis not present

## 2019-12-26 DIAGNOSIS — I480 Paroxysmal atrial fibrillation: Secondary | ICD-10-CM | POA: Diagnosis not present

## 2019-12-26 DIAGNOSIS — E039 Hypothyroidism, unspecified: Secondary | ICD-10-CM | POA: Diagnosis not present

## 2019-12-26 DIAGNOSIS — I2510 Atherosclerotic heart disease of native coronary artery without angina pectoris: Secondary | ICD-10-CM | POA: Diagnosis not present

## 2019-12-26 LAB — POCT INR: INR: 2.3 (ref 2.0–3.0)

## 2019-12-26 NOTE — Patient Instructions (Signed)
Description   Spoke with Tab[facility]a [facility] RN while in pt's home, advised to have pt continue on same dosage 2mg  daily. Recheck INR in 3 weeks. Call us with any medication changes or concerns # [phone]. [place]'s # [phone]

## 2019-12-28 DIAGNOSIS — H5462 Unqualified visual loss, left eye, normal vision right eye: Secondary | ICD-10-CM | POA: Diagnosis not present

## 2019-12-28 DIAGNOSIS — K219 Gastro-esophageal reflux disease without esophagitis: Secondary | ICD-10-CM | POA: Diagnosis not present

## 2019-12-28 DIAGNOSIS — Z950 Presence of cardiac pacemaker: Secondary | ICD-10-CM | POA: Diagnosis not present

## 2019-12-28 DIAGNOSIS — Z7901 Long term (current) use of anticoagulants: Secondary | ICD-10-CM | POA: Diagnosis not present

## 2019-12-28 DIAGNOSIS — K3184 Gastroparesis: Secondary | ICD-10-CM | POA: Diagnosis not present

## 2019-12-28 DIAGNOSIS — G5791 Unspecified mononeuropathy of right lower limb: Secondary | ICD-10-CM | POA: Diagnosis not present

## 2019-12-28 DIAGNOSIS — I5033 Acute on chronic diastolic (congestive) heart failure: Secondary | ICD-10-CM | POA: Diagnosis not present

## 2019-12-28 DIAGNOSIS — J301 Allergic rhinitis due to pollen: Secondary | ICD-10-CM | POA: Diagnosis not present

## 2019-12-28 DIAGNOSIS — I495 Sick sinus syndrome: Secondary | ICD-10-CM | POA: Diagnosis not present

## 2019-12-28 DIAGNOSIS — I130 Hypertensive heart and chronic kidney disease with heart failure and stage 1 through stage 4 chronic kidney disease, or unspecified chronic kidney disease: Secondary | ICD-10-CM | POA: Diagnosis not present

## 2019-12-28 DIAGNOSIS — I2510 Atherosclerotic heart disease of native coronary artery without angina pectoris: Secondary | ICD-10-CM | POA: Diagnosis not present

## 2019-12-28 DIAGNOSIS — E039 Hypothyroidism, unspecified: Secondary | ICD-10-CM | POA: Diagnosis not present

## 2019-12-28 DIAGNOSIS — Z85038 Personal history of other malignant neoplasm of large intestine: Secondary | ICD-10-CM | POA: Diagnosis not present

## 2019-12-28 DIAGNOSIS — Z5181 Encounter for therapeutic drug level monitoring: Secondary | ICD-10-CM | POA: Diagnosis not present

## 2019-12-28 DIAGNOSIS — E559 Vitamin D deficiency, unspecified: Secondary | ICD-10-CM | POA: Diagnosis not present

## 2019-12-28 DIAGNOSIS — I081 Rheumatic disorders of both mitral and tricuspid valves: Secondary | ICD-10-CM | POA: Diagnosis not present

## 2019-12-28 DIAGNOSIS — E875 Hyperkalemia: Secondary | ICD-10-CM | POA: Diagnosis not present

## 2019-12-28 DIAGNOSIS — G2581 Restless legs syndrome: Secondary | ICD-10-CM | POA: Diagnosis not present

## 2019-12-28 DIAGNOSIS — Z952 Presence of prosthetic heart valve: Secondary | ICD-10-CM | POA: Diagnosis not present

## 2019-12-28 DIAGNOSIS — Z48812 Encounter for surgical aftercare following surgery on the circulatory system: Secondary | ICD-10-CM | POA: Diagnosis not present

## 2019-12-28 DIAGNOSIS — H6523 Chronic serous otitis media, bilateral: Secondary | ICD-10-CM | POA: Diagnosis not present

## 2019-12-28 DIAGNOSIS — I480 Paroxysmal atrial fibrillation: Secondary | ICD-10-CM | POA: Diagnosis not present

## 2019-12-28 DIAGNOSIS — N183 Chronic kidney disease, stage 3 unspecified: Secondary | ICD-10-CM | POA: Diagnosis not present

## 2019-12-28 DIAGNOSIS — I272 Pulmonary hypertension, unspecified: Secondary | ICD-10-CM | POA: Diagnosis not present

## 2020-01-02 ENCOUNTER — Ambulatory Visit: Payer: Medicare Other | Admitting: Cardiology

## 2020-01-03 DIAGNOSIS — N183 Chronic kidney disease, stage 3 unspecified: Secondary | ICD-10-CM | POA: Diagnosis not present

## 2020-01-03 DIAGNOSIS — Z7901 Long term (current) use of anticoagulants: Secondary | ICD-10-CM | POA: Diagnosis not present

## 2020-01-03 DIAGNOSIS — I272 Pulmonary hypertension, unspecified: Secondary | ICD-10-CM | POA: Diagnosis not present

## 2020-01-03 DIAGNOSIS — I2510 Atherosclerotic heart disease of native coronary artery without angina pectoris: Secondary | ICD-10-CM | POA: Diagnosis not present

## 2020-01-03 DIAGNOSIS — G2581 Restless legs syndrome: Secondary | ICD-10-CM | POA: Diagnosis not present

## 2020-01-03 DIAGNOSIS — J301 Allergic rhinitis due to pollen: Secondary | ICD-10-CM | POA: Diagnosis not present

## 2020-01-03 DIAGNOSIS — I5033 Acute on chronic diastolic (congestive) heart failure: Secondary | ICD-10-CM | POA: Diagnosis not present

## 2020-01-03 DIAGNOSIS — E559 Vitamin D deficiency, unspecified: Secondary | ICD-10-CM | POA: Diagnosis not present

## 2020-01-03 DIAGNOSIS — Z48812 Encounter for surgical aftercare following surgery on the circulatory system: Secondary | ICD-10-CM | POA: Diagnosis not present

## 2020-01-03 DIAGNOSIS — I081 Rheumatic disorders of both mitral and tricuspid valves: Secondary | ICD-10-CM | POA: Diagnosis not present

## 2020-01-03 DIAGNOSIS — H5462 Unqualified visual loss, left eye, normal vision right eye: Secondary | ICD-10-CM | POA: Diagnosis not present

## 2020-01-03 DIAGNOSIS — Z85038 Personal history of other malignant neoplasm of large intestine: Secondary | ICD-10-CM | POA: Diagnosis not present

## 2020-01-03 DIAGNOSIS — I130 Hypertensive heart and chronic kidney disease with heart failure and stage 1 through stage 4 chronic kidney disease, or unspecified chronic kidney disease: Secondary | ICD-10-CM | POA: Diagnosis not present

## 2020-01-03 DIAGNOSIS — H6523 Chronic serous otitis media, bilateral: Secondary | ICD-10-CM | POA: Diagnosis not present

## 2020-01-03 DIAGNOSIS — I495 Sick sinus syndrome: Secondary | ICD-10-CM | POA: Diagnosis not present

## 2020-01-03 DIAGNOSIS — G5791 Unspecified mononeuropathy of right lower limb: Secondary | ICD-10-CM | POA: Diagnosis not present

## 2020-01-03 DIAGNOSIS — I480 Paroxysmal atrial fibrillation: Secondary | ICD-10-CM | POA: Diagnosis not present

## 2020-01-03 DIAGNOSIS — E039 Hypothyroidism, unspecified: Secondary | ICD-10-CM | POA: Diagnosis not present

## 2020-01-03 DIAGNOSIS — K3184 Gastroparesis: Secondary | ICD-10-CM | POA: Diagnosis not present

## 2020-01-03 DIAGNOSIS — E875 Hyperkalemia: Secondary | ICD-10-CM | POA: Diagnosis not present

## 2020-01-03 DIAGNOSIS — Z950 Presence of cardiac pacemaker: Secondary | ICD-10-CM | POA: Diagnosis not present

## 2020-01-03 DIAGNOSIS — Z952 Presence of prosthetic heart valve: Secondary | ICD-10-CM | POA: Diagnosis not present

## 2020-01-03 DIAGNOSIS — Z5181 Encounter for therapeutic drug level monitoring: Secondary | ICD-10-CM | POA: Diagnosis not present

## 2020-01-03 DIAGNOSIS — K219 Gastro-esophageal reflux disease without esophagitis: Secondary | ICD-10-CM | POA: Diagnosis not present

## 2020-01-07 ENCOUNTER — Telehealth: Payer: Self-pay | Admitting: Cardiology

## 2020-01-07 NOTE — Telephone Encounter (Signed)
Advised daughter Peter Congo, verbalized understanding

## 2020-01-07 NOTE — Telephone Encounter (Signed)
I would reduce metoprolol to 50 mg daily in the morning  Airel Magadan [name] MD, [facility]

## 2020-01-07 NOTE — Telephone Encounter (Signed)
Spoke with patients daughter regarding blood pressure  Blood pressure today 150/75 HR 73, now 114/60 HR 66 4/30 am blood pressure 169/78 HR 73, later 116/60 after evening meds down to 94/46 came up later to 102/53 She is taking the following  Lasix 20 mg daily  Metoprolol 100 mg 1/2 tablet twice a day  Verapamil 120 mg twice a day  Stopped Losartan last week  Patient is feeling tired all the time Does feel like she in rhythm per daughter  Daughter would like guidelines regarding when to give medication/when to hold  Will forward to Dr [name] for review

## 2020-01-07 NOTE — Telephone Encounter (Signed)
Daughter is calling about her BP having been really low even taking her off of the one medication.  She started give only 50mg  of metoprolol tartrate, instead of 100mg  as prescribed.  She wants to make sure that is okay to do.   She is also just weighing at about 92-93lbs.  She would like to speak to [place] about this.

## 2020-01-08 ENCOUNTER — Ambulatory Visit (INDEPENDENT_AMBULATORY_CARE_PROVIDER_SITE_OTHER): Payer: Medicare Other | Admitting: *Deleted

## 2020-01-08 DIAGNOSIS — I5033 Acute on chronic diastolic (congestive) heart failure: Secondary | ICD-10-CM | POA: Diagnosis not present

## 2020-01-08 DIAGNOSIS — K3184 Gastroparesis: Secondary | ICD-10-CM | POA: Diagnosis not present

## 2020-01-08 DIAGNOSIS — I2510 Atherosclerotic heart disease of native coronary artery without angina pectoris: Secondary | ICD-10-CM | POA: Diagnosis not present

## 2020-01-08 DIAGNOSIS — I480 Paroxysmal atrial fibrillation: Secondary | ICD-10-CM | POA: Diagnosis not present

## 2020-01-08 DIAGNOSIS — G2581 Restless legs syndrome: Secondary | ICD-10-CM | POA: Diagnosis not present

## 2020-01-08 DIAGNOSIS — E875 Hyperkalemia: Secondary | ICD-10-CM | POA: Diagnosis not present

## 2020-01-08 DIAGNOSIS — Z5181 Encounter for therapeutic drug level monitoring: Secondary | ICD-10-CM | POA: Diagnosis not present

## 2020-01-08 DIAGNOSIS — Z952 Presence of prosthetic heart valve: Secondary | ICD-10-CM | POA: Diagnosis not present

## 2020-01-08 DIAGNOSIS — I4821 Permanent atrial fibrillation: Secondary | ICD-10-CM | POA: Diagnosis not present

## 2020-01-08 DIAGNOSIS — Z48812 Encounter for surgical aftercare following surgery on the circulatory system: Secondary | ICD-10-CM | POA: Diagnosis not present

## 2020-01-08 DIAGNOSIS — N183 Chronic kidney disease, stage 3 unspecified: Secondary | ICD-10-CM | POA: Diagnosis not present

## 2020-01-08 DIAGNOSIS — Z950 Presence of cardiac pacemaker: Secondary | ICD-10-CM | POA: Diagnosis not present

## 2020-01-08 DIAGNOSIS — I272 Pulmonary hypertension, unspecified: Secondary | ICD-10-CM | POA: Diagnosis not present

## 2020-01-08 DIAGNOSIS — H5462 Unqualified visual loss, left eye, normal vision right eye: Secondary | ICD-10-CM | POA: Diagnosis not present

## 2020-01-08 DIAGNOSIS — I130 Hypertensive heart and chronic kidney disease with heart failure and stage 1 through stage 4 chronic kidney disease, or unspecified chronic kidney disease: Secondary | ICD-10-CM | POA: Diagnosis not present

## 2020-01-08 DIAGNOSIS — Z85038 Personal history of other malignant neoplasm of large intestine: Secondary | ICD-10-CM | POA: Diagnosis not present

## 2020-01-08 DIAGNOSIS — E039 Hypothyroidism, unspecified: Secondary | ICD-10-CM | POA: Diagnosis not present

## 2020-01-08 DIAGNOSIS — H6523 Chronic serous otitis media, bilateral: Secondary | ICD-10-CM | POA: Diagnosis not present

## 2020-01-08 DIAGNOSIS — J301 Allergic rhinitis due to pollen: Secondary | ICD-10-CM | POA: Diagnosis not present

## 2020-01-08 DIAGNOSIS — G5791 Unspecified mononeuropathy of right lower limb: Secondary | ICD-10-CM | POA: Diagnosis not present

## 2020-01-08 DIAGNOSIS — E559 Vitamin D deficiency, unspecified: Secondary | ICD-10-CM | POA: Diagnosis not present

## 2020-01-08 DIAGNOSIS — Z7901 Long term (current) use of anticoagulants: Secondary | ICD-10-CM | POA: Diagnosis not present

## 2020-01-08 DIAGNOSIS — I495 Sick sinus syndrome: Secondary | ICD-10-CM | POA: Diagnosis not present

## 2020-01-08 DIAGNOSIS — I081 Rheumatic disorders of both mitral and tricuspid valves: Secondary | ICD-10-CM | POA: Diagnosis not present

## 2020-01-08 DIAGNOSIS — K219 Gastro-esophageal reflux disease without esophagitis: Secondary | ICD-10-CM | POA: Diagnosis not present

## 2020-01-08 LAB — POCT INR: INR: 2.4 (ref 2.0–3.0)

## 2020-01-08 NOTE — Patient Instructions (Signed)
Description   Spoke with Tab[facility]a [facility] RN while in pt's home, advised to have pt continue on same dosage 2mg daily. Recheck INR in 2 weeks by HH RN. Call us with any medication changes or concerns # [phone]. [place]'s # [phone]     

## 2020-01-09 ENCOUNTER — Other Ambulatory Visit: Payer: Self-pay | Admitting: Adult Health

## 2020-01-10 ENCOUNTER — Telehealth: Payer: Self-pay | Admitting: Cardiology

## 2020-01-10 NOTE — Telephone Encounter (Signed)
Spoke with patient's daughter about BP & HR Patient woke up with her heart rating this AM 8:21am BP 139/83 and HR 132     Patient took metoprolol 50mg  (this was decreased on 5/3) 9:54am BP 112/56 and HR 102  Now - BP 91/51 and HR 69 - patient is winded with minimal exertion, pale  Advised that patient rest, hydrate, change positions slowly  Daughter reports patient is 92lbs, appetite is fine  Daughter would like to know OK to try and add back metoprolol 50mg  in the evening or split up 50mg  into BID dosing to help with heart racing in AM?  Will send message to MD to review

## 2020-01-10 NOTE — Telephone Encounter (Signed)
Routing to DOD as primary cardiologist is listed off

## 2020-01-10 NOTE — Telephone Encounter (Signed)
   STAT if HR is under 50 or over 120 (normal HR is 60-100 beats per minute)  1) What is your heart rate? 102  2) Do you have a log of your heart rate readings (document readings)? 132,102  3) Do you have any other symptoms? Pt's daughter said when pt woke up this morning her heart was racing they check it was 132 and her BP 139/83. She said Dr. [name] discontinued her metoprolol and losartan, but since pt is having high HR she gave pt metoprolol 50 mg and at 9:54 am Pt HR is at 102 and BP 112/66. She wanted to check in if Dr. [name] needs to change her meds again since she feels like pt is having afib again. She said she can receive call before 1pm if its after 1 pm call her other sister Beverlee Nims.  Please advise

## 2020-01-10 NOTE — Telephone Encounter (Signed)
I would not add metoprolol back unless BP consistently [id number] systolic. If her evening blood pressure improves, she can add metoprolol 50 mg in the evening, but with her BP of 91/51 even when her heart rate is controlled, I would be very hesitant of too much medication. If this continues to be a problem, would have her see an APP sooner than her scheduled visit with Dr. [name].

## 2020-01-11 NOTE — Telephone Encounter (Signed)
Spoke to patient's daughter Beverlee Nims Dr.Christopher's advice given.Appointment scheduled with Coletta Memos NP 5/12 at 2:15 pm.Advised to bring B/P diary and all medications.

## 2020-01-15 DIAGNOSIS — I2510 Atherosclerotic heart disease of native coronary artery without angina pectoris: Secondary | ICD-10-CM | POA: Diagnosis not present

## 2020-01-15 DIAGNOSIS — G2581 Restless legs syndrome: Secondary | ICD-10-CM | POA: Diagnosis not present

## 2020-01-15 DIAGNOSIS — E875 Hyperkalemia: Secondary | ICD-10-CM | POA: Diagnosis not present

## 2020-01-15 DIAGNOSIS — N183 Chronic kidney disease, stage 3 unspecified: Secondary | ICD-10-CM | POA: Diagnosis not present

## 2020-01-15 DIAGNOSIS — E559 Vitamin D deficiency, unspecified: Secondary | ICD-10-CM | POA: Diagnosis not present

## 2020-01-15 DIAGNOSIS — Z952 Presence of prosthetic heart valve: Secondary | ICD-10-CM | POA: Diagnosis not present

## 2020-01-15 DIAGNOSIS — Z5181 Encounter for therapeutic drug level monitoring: Secondary | ICD-10-CM | POA: Diagnosis not present

## 2020-01-15 DIAGNOSIS — G5791 Unspecified mononeuropathy of right lower limb: Secondary | ICD-10-CM | POA: Diagnosis not present

## 2020-01-15 DIAGNOSIS — I5033 Acute on chronic diastolic (congestive) heart failure: Secondary | ICD-10-CM | POA: Diagnosis not present

## 2020-01-15 DIAGNOSIS — Z7901 Long term (current) use of anticoagulants: Secondary | ICD-10-CM | POA: Diagnosis not present

## 2020-01-15 DIAGNOSIS — I081 Rheumatic disorders of both mitral and tricuspid valves: Secondary | ICD-10-CM | POA: Diagnosis not present

## 2020-01-15 DIAGNOSIS — I130 Hypertensive heart and chronic kidney disease with heart failure and stage 1 through stage 4 chronic kidney disease, or unspecified chronic kidney disease: Secondary | ICD-10-CM | POA: Diagnosis not present

## 2020-01-15 DIAGNOSIS — I480 Paroxysmal atrial fibrillation: Secondary | ICD-10-CM | POA: Diagnosis not present

## 2020-01-15 DIAGNOSIS — Z48812 Encounter for surgical aftercare following surgery on the circulatory system: Secondary | ICD-10-CM | POA: Diagnosis not present

## 2020-01-15 DIAGNOSIS — H6523 Chronic serous otitis media, bilateral: Secondary | ICD-10-CM | POA: Diagnosis not present

## 2020-01-15 DIAGNOSIS — E039 Hypothyroidism, unspecified: Secondary | ICD-10-CM | POA: Diagnosis not present

## 2020-01-15 DIAGNOSIS — K3184 Gastroparesis: Secondary | ICD-10-CM | POA: Diagnosis not present

## 2020-01-15 DIAGNOSIS — H5462 Unqualified visual loss, left eye, normal vision right eye: Secondary | ICD-10-CM | POA: Diagnosis not present

## 2020-01-15 DIAGNOSIS — Z950 Presence of cardiac pacemaker: Secondary | ICD-10-CM | POA: Diagnosis not present

## 2020-01-15 DIAGNOSIS — I272 Pulmonary hypertension, unspecified: Secondary | ICD-10-CM | POA: Diagnosis not present

## 2020-01-15 DIAGNOSIS — Z85038 Personal history of other malignant neoplasm of large intestine: Secondary | ICD-10-CM | POA: Diagnosis not present

## 2020-01-15 DIAGNOSIS — K219 Gastro-esophageal reflux disease without esophagitis: Secondary | ICD-10-CM | POA: Diagnosis not present

## 2020-01-15 DIAGNOSIS — J301 Allergic rhinitis due to pollen: Secondary | ICD-10-CM | POA: Diagnosis not present

## 2020-01-15 DIAGNOSIS — I495 Sick sinus syndrome: Secondary | ICD-10-CM | POA: Diagnosis not present

## 2020-01-15 NOTE — Progress Notes (Signed)
Cardiology Clinic Note   Patient Name: [name] Date of Encounter: 01/16/2020  Primary Care Provider:  [name], MD Primary Cardiologist:  [name], MD  Patient Profile    [name] 86 year old female presents for follow-up evaluation of her atrial fibrillation, aortic stenosis, and essential hypertension.  Past Medical History    Past Medical History:  Diagnosis Date  . Adenocarcinoma of stomach ([place]) 11/11/2015   gastric mass on egd,   . Anal fissure   . Anemia    hx 3/17 iron infusion, on aranesp and injected B12 since 11/2015.   [name] Anxiety   . Aortic root dilatation ([place])   . Arthritis    oa  . Blind left eye 2015   partial blind in left eye due to stroke   . 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 CAD (coronary artery disease)   . Cancer (HCC)    hx bladder, kidney, uterus  . Cataracts, bilateral   . Chronic kidney disease    only has one kidney rt lft rem 03 ca     dr. Risa Grill, bladder cancer 05-04-2018  . Elevated LFTs    hepatic steatosis, none recent  . Gastroparesis   . GERD (gastroesophageal reflux disease)   . Hearing loss    wears hearing aids  . History of uterine cancer 1980s   treated with hysterectomy, external radiation and radiation seed implants.   [name] HTN (hypertension)   . Hyperlipemia   . Hypothyroidism   . Iron deficiency anemia due to chronic blood loss 11/24/2015  . Neuropathy    right leg  . Osteomyelitis ([place]) as child  . PAF (paroxysmal atrial fibrillation) ([place])   . Presence of permanent cardiac pacemaker   . Radiation proctitis   . Refusal of blood transfusions as patient is Jehovah's Witness   . Restless leg syndrome   . 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 S/P TAVR (transcatheter aortic valve replacement)   . Severe aortic stenosis   . Stroke [facility]) 2015   partial stroke left legally blind; left eye   .  Tachycardia-bradycardia syndrome ([place])    s/p PPM by Dr Doreatha Lew (MDT) 07/03/10  . Walker as ambulation aid   . Wears glasses   . Wears partial dentures    upper   Past Surgical History:  Procedure Laterality Date  . CARDIAC CATHETERIZATION  2008  . CARDIOVERSION N/A 12/05/2019   Procedure: CARDIOVERSION;  Surgeon: Elouise Munroe, MD;  Location: [facility] ENDOSCOPY;  Service: Cardiovascular;  Laterality: N/A;  . CHOLECYSTECTOMY  1970s  . COLONOSCOPY N/A 11/11/2015   Procedure: COLONOSCOPY;  Surgeon: [name], MD;  Location: [facility] ENDOSCOPY;  Service: Endoscopy;  Laterality: N/A;  . CYSTOSCOPY W/ RETROGRADES Right 05/01/2018   Procedure: CYSTOSCOPY WITH RIGHT RETROGRADE PYELOGRAM;  Surgeon: Lucas Mallow, MD;  Location: [facility] ORS;  Service: Urology;  Laterality: Right;  . CYSTOSCOPY W/ RETROGRADES Right 07/21/2018   Procedure: CYSTOSCOPY WITH RIGHT RETROGRADE PYELOGRAM;  Surgeon: Alexis Frock, MD;  Location: [facility] ORS;  Service: Urology;  Laterality: Right;  . ESOPHAGOGASTRODUODENOSCOPY N/A 11/11/2015   Procedure: ESOPHAGOGASTRODUODENOSCOPY (EGD);  Surgeon: [name], MD;  Location: [facility] ENDOSCOPY;  Service: Endoscopy;  Laterality: N/A;  . ESOPHAGOGASTRODUODENOSCOPY N/A 02/05/2016   Procedure: ESOPHAGOGASTRODUODENOSCOPY (EGD);  Surgeon: Ladene Artist, MD;  Location: [facility] ENDOSCOPY;  Service: Endoscopy;  Laterality: N/A;  . ESOPHAGOGASTRODUODENOSCOPY  N/A 04/02/2016   Procedure: ESOPHAGOGASTRODUODENOSCOPY (EGD);  Surgeon: [name], MD;  Location: [facility] ENDOSCOPY;  Service: Endoscopy;  Laterality: N/A;  . ESOPHAGOGASTRODUODENOSCOPY (EGD) WITH PROPOFOL N/A 04/06/2017   Procedure: ESOPHAGOGASTRODUODENOSCOPY (EGD) WITH PROPOFOL;  Surgeon: [name], MD;  [facility] ENDOSCOPY;  Service: Endoscopy;  Laterality: N/A;  . EYE SURGERY Bilateral    remove cataracts  . GASTRECTOMY N/A 01/15/2016   Procedure: OPEN PARTIAL GASTRECTOMY;  Surgeon: Stark Klein, MD;  Location: [place];  Service: General;   Laterality: N/A;  . GASTRECTOMY Left 02/24/2016   Procedure: OPEN J TUBE;  Surgeon: Stark Klein, MD;  Location: [place];  Service: General;  Laterality: Left;  [name] GASTROJEJUNOSTOMY N/A 05/18/2016   Procedure: ROUX EN [place];  Surgeon: Stark Klein, MD;  Location: [place];  Service: General;  Laterality: N/A;  . I & D KNEE WITH POLY EXCHANGE Right 12/17/2013   Procedure: IRRIGATION AND DEBRIDEMEN RIGHT TOTAL  KNEE WITH POLY EXCHANGE;  Surgeon: Mauri Pole, MD;  [facility]S;  Service: Orthopedics;  Laterality: Right;  . IR GENERIC HISTORICAL  06/17/2016   IR RADIOLOGIST EVAL & MGMT 06/17/2016 Ascencion Dike, PA-C [facility] INTERV RAD  . LAPAROSCOPY N/A 01/15/2016   Procedure: LAPAROSCOPY DIAGNOSTIC;  Surgeon: Stark Klein, MD;  Location: [place];  Service: General;  Laterality: N/A;  . LYSIS OF ADHESION  05/18/2016   Procedure: LYSIS OF ADHESION;  Surgeon: Stark Klein, MD;  Location: [place];  Service: General;;  . NEPHRECTOMY Left    renal cell cancer  . PACEMAKER INSERTION  07/04/2011   MDT PPM implanted by Dr Doreatha Lew for tachy/brady; managed by Dr Thompson Grayer   . PATELLAR TENDON REPAIR Right 03/06/2014   Procedure: AVULSION WITH PATELLA TENDON REPAIR;  Surgeon: Mauri Pole, MD;  [facility];  Service: Orthopedics;  Laterality: Right;  . PATENT DUCTUS ARTERIOUS REPAIR  ~ 1949  . RIGHT/LEFT HEART CATH AND CORONARY ANGIOGRAPHY N/A 10/09/2019   Procedure: RIGHT/LEFT HEART CATH AND CORONARY ANGIOGRAPHY;  Surgeon: [name], [name], MD;  Location: [place] CV LAB;  Service: Cardiovascular;  Laterality: N/A;  . TEE WITHOUT CARDIOVERSION N/A 10/30/2019   Procedure: TRANSESOPHAGEAL ECHOCARDIOGRAM (TEE);  Surgeon: Burnell Blanks, MD;  Location: [place];  Service: Open Heart Surgery;  Laterality: N/A;  . TOTAL ABDOMINAL HYSTERECTOMY  85  . TOTAL HIP REVISION Right 2009   initial replacment surg  . TOTAL KNEE ARTHROPLASTY  02/07/2012   Procedure: TOTAL KNEE ARTHROPLASTY;  Surgeon:  Mauri Pole, MD;  [facility];  Service: Orthopedics;  Laterality: Right;  . TOTAL KNEE REVISION Right 07/29/2014   Procedure: RIGHT KNEE REVISION OF PREVIOUS REPAIR EXTENSOR MECHANISM;  Surgeon: Mauri Pole, MD;  [facility];  Service: Orthopedics;  Laterality: Right;  . TRANSCATHETER AORTIC VALVE REPLACEMENT, CAROTID Left 10/30/2019   TRANSCATHETER AORTIC VALVE REPLACEMENT, LEFT CAROTID (Left Neck)   . TRANSCATHETER AORTIC VALVE REPLACEMENT, CAROTID Left 10/30/2019   Procedure: TRANSCATHETER AORTIC VALVE REPLACEMENT, LEFT CAROTID;  Surgeon: Burnell Blanks, MD;  Location: [place];  Service: Open Heart Surgery;  Laterality: Left;  . TRANSURETHRAL RESECTION OF BLADDER TUMOR N/A 07/21/2018   Procedure: TRANSURETHRAL RESECTION OF BLADDER TUMOR (TURBT);  Surgeon: Alexis Frock, MD;  [facility];  Service: Urology;  Laterality: N/A;  . TRANSURETHRAL RESECTION OF BLADDER TUMOR WITH MITOMYCIN-C N/A 05/01/2018   Procedure: TRANSURETHRAL RESECTION OF BLADDER TUMOR WITH POST OP INSTILLATION OF GEMCITABINE;  Surgeon: Lucas Mallow, MD;  [facility];  Service: Urology;  Laterality: N/A;  . WHIPPLE PROCEDURE N/A 05/18/2016   Procedure: EXPLORATORY LAPAROTOMY;  Surgeon: Stark Klein, MD;  Location: [facility];  Service: General;  Laterality: N/A;    Allergies  Allergies  Allergen Reactions  . Amiodarone Other (See Comments)     Thyroid and liver and kidney problems  . Adhesive [Tape] Other (See Comments)    Tears skin, Please use "paper" tape  . Hydrocodone Other (See Comments)    HALLLUCINATIONS  . Other Other (See Comments)    NO BLOOD PRODUCTS -  Jehoval Witness  . Reglan [Metoclopramide] Hives, Itching and Rash  . Remeron [Mirtazapine] Other (See Comments)    Hallucinations and make pt loopy  . Codeine Nausea And Vomiting  . Morphine And Related Nausea And Vomiting  . Requip [Ropinirole Hcl] Other (See Comments)    Headache   . Zinc Nausea Only    History  of Present Illness    She has a PMH of atrial fibrillation (CHA2DS2-VASc score 5) tachybradycardia syndrome, severe aortic stenosis status post TAVR, essential hypertension, acute on chronic diastolic CHF, GERD, CKD stage III, cardiac pacemaker (Medtronic dual-chamber 10/11), anemia (Jehovah's Witness), and mixed hyperlipidemia.  Her echocardiogram from 12/20 showed an LVEF of 60-65%, moderate LVH, LV diastolic function unable to be assessed, no R WMA, mild LAE, mild MR, and severely calcified AV with suspected low gradient moderate-severe aortic stenosis and moderate aortic insufficiency.  She indicated that she had increased dyspnea on exertion and was requiring more Lasix.  Her heart rate had increased at that time as well.  She underwent cardiac catheterization 2/21 which showed nonobstructive CAD.  Her peak to peak and mean transvalvular gradients across her aortic valve were 19 and 11.4 mmHg which showed aortic valve area of 0.84 cm.  She underwent a successful TAVR procedure with a 23 mm Edwards Sapien.  Left carotid approach on 10/30/2019 was used.  Her postoperative echocardiogram showed an EF of 70% status post TAVR with mild-moderate PVL and a mean gradient of 7 mmHg.  Preop labs showed a BNP greater than 2000 and some mild pulmonary congestion on her chest x-ray.  There was some concern for potential valve migration.  However patient is feeling well and was discharged on postop day 1 10/31/2019.  She was discharged on Coumadin alone.  Due to her high risk of bleeding.  She subsequently called with acute worsening shortness of breath, chest pressure, weight gain and orthopnea.  She did have improvement with Lasix 40 mg daily.  A limited echo 11/05/2019 showed stable mild-moderate PVL.  Her blood pressure was elevated and amiodarone was added.  She was seen in the valve clinic on March 18 and was in atrial fibrillation.  She underwent DCCV on 12/05/2019 which was successful.  She was last seen by Dr.  [name] on 12/06/2019.  During that time she stated she was feeling better.  She still had some shortness of breath.  Her activity was limited to short walks with her cane.  Her appetite was good.  Her weight was down to 96 pounds.  She still had some mild edema.  She denied leg pain but stated that she fatigues quickly with exertion.  She has peripheral neuropathy so her pain is difficult to assess.  Contacted nurse triage line on 01/07/2020 indicating that she was having increased heart rates.  Her metoprolol was restarted initially in the morning but then changed to the evening.  She presents the clinic  today with her daughter for further evaluation and states she has been having higher heart rates in the morning and in the afternoon.  Her blood pressures have also been somewhat labile in the 0000000 systolic in the morning and in the afternoon.  They have been well controlled through the middle of the day and after she takes her second dose of metoprolol tartrate in the 120s-115's.  She feels that she has been able to increase her physical activity and is walking regularly with her walker.  She is following a heart healthy low-sodium diet.  I will change her to metoprolol succinate 50 mg and have her take it in the afternoon/evening along with her verapamil twice daily.  I will give her the salty 6 information sheet, blood pressure log, and have her follow-up with Dr. [name] as scheduled.  Today she denies  chest pain, shortness of breath, lower extremity edema, fatigue, palpitations, melena, hematuria, hemoptysis, diaphoresis, weakness, presyncope, syncope, orthopnea, and PND.   Home Medications    Prior to Admission medications   Medication Sig Start Date End Date Taking? Authorizing Provider  acetaminophen (TYLENOL) 500 MG tablet Take 500-1,000 mg by mouth every 6 (six) hours as needed for mild pain or headache.     [provider]  ALPRAZolam Duanne Moron) 0.5 MG tablet Take 0.5 mg by mouth 2 (two)  times daily as needed for anxiety.     [provider]  Alum & Mag Hydroxide-Simeth (MYLANTA PO) Take 20 mLs by mouth every evening.     [provider]  amoxicillin (AMOXIL) 500 MG tablet Take 4 tablets (2,000 mg total) by mouth as directed. 1 hour prior to dental work including cleanings 11/26/19   Eileen Stanford, PA-C  Ascorbic Acid (VITAMIN C) 1000 MG tablet Take 1,000 mg by mouth daily.    [provider]  Biotin 10000 MCG TBDP Take 10,000 mcg by mouth daily.    [provider]  Cholecalciferol (VITAMIN D-3) 5000 units TABS Take 5,000 Units by mouth 3 (three) times daily.     [provider]  erythromycin ophthalmic ointment Place 1 application into both eyes at bedtime as needed (stye).     [provider]  famotidine (PEPCID) 40 MG tablet Take 1 tablet Daily for Indigestion & Heartburn 05/21/19   [name], MD  ferrous sulfate 325 (65 FE) MG tablet Take 325 mg by mouth daily in the afternoon.    [provider]  furosemide (LASIX) 20 MG tablet Take 1 tablet (20 mg total) by mouth daily. 11/05/19 10/30/20  Eileen Stanford, PA-C  gabapentin (NEURONTIN) 300 MG capsule Take 300 mg by mouth 2 (two) times daily.    [provider]  levothyroxine (SYNTHROID) 100 MCG tablet Take 50 mcg by mouth daily before breakfast.    [provider]  loperamide (IMODIUM A-D) 2 MG tablet Take up to 12 tablets /day as needed for  for Diarrhea 01/30/19   [name], MD  loratadine (CLARITIN) 10 MG tablet Take 10 mg by mouth at bedtime.     [provider]  metoprolol tartrate (LOPRESSOR) 100 MG tablet TAKE 1 TABLET BY MOUTH TWICE A DAY 01/09/20   [name], NP  nitroGLYCERIN (NITROSTAT) 0.4 MG SL tablet Dissolve 1 tablet under tongue every 5 minutes if needed for Angina 09/20/19   [name], MD  ondansetron (ZOFRAN-ODT) 8 MG disintegrating tablet Take 8 mg by mouth every 8 (eight) hours as needed for  nausea or vomiting.  [provider]  potassium chloride (KLOR-CON) 10 MEQ tablet Take 1 tablet (10 mEq total) by mouth daily. 11/05/19 10/30/20  Eileen Stanford, PA-C  Propylene Glycol (SYSTANE COMPLETE) 0.6 % SOLN Place 1 drop into both eyes 3 (three) times daily as needed (irritation/dry eyes.).    [provider]  simethicone (MYLICON) 80 MG chewable tablet Chew 80 mg by mouth every 6 (six) hours as needed for flatulence.    [provider]  sucralfate (CARAFATE) 1 g tablet Take 1 g by mouth 4 (four) times daily.    [provider]  tobramycin-dexamethasone [facility]) ophthalmic solution Place 1 drop into both eyes daily as needed (stye).     [provider]  triamcinolone cream (KENALOG) 0.1 % Apply 1 application topically 2 (two) times daily as needed (rash/skin irritation.).     [provider]  verapamil (CALAN) 120 MG tablet Take 120 mg by mouth 2 (two) times daily.    [provider]  warfarin (COUMADIN) 2 MG tablet TAKE 1 TO 2 TABLETS AS DIRECTED TO PREVENT BLOOD CLOTS 07/19/19   [name], MD    Family History    Family History  Problem Relation Age of Onset  . Colon cancer Mother 16  . Heart disease Mother   . Heart disease Father   . Heart attack Father   . Heart disease Maternal Grandmother    She indicated that her mother is deceased. She indicated that her father is deceased. She indicated that her maternal grandmother is deceased. She indicated that her maternal grandfather is deceased. She indicated that her paternal grandmother is deceased. She indicated that her paternal grandfather is deceased.  Social History    Social History   Socioeconomic History  . Marital status: Widowed    Spouse name: Not on file  . Number of children: 3  . Years of education: Not on file  . Highest education level: Not on file  Occupational History  . Not on file  Tobacco Use  . Smoking status: Never Smoker    . Smokeless tobacco: Never Used  Substance and Sexual Activity  . Alcohol use: No  . Drug use: No  . Sexual activity: Never    Comment: Hysterectomy  Other Topics Concern  . Not on file  Social History Narrative   01/01/19 Lives in [place] w/dgtr, [name].  Continues to work.   Social Determinants of Health   Financial Resource Strain:   . Difficulty of Paying Living Expenses:   Food Insecurity:   . Worried About Charity fundraiser in the Last Year:   . Arboriculturist in the Last Year:   Transportation Needs:   . Film/video editor (Medical):   [name] Lack of Transportation (Non-Medical):   Physical Activity:   . Days of Exercise per Week:   . Minutes of Exercise per Session:   Stress:   . Feeling of Stress :   Social Connections:   . Frequency of Communication with Friends and Family:   . Frequency of Social Gatherings with Friends and Family:   . Attends Religious Services:   . Active Member of Clubs or Organizations:   . Attends Archivist Meetings:   [name] Marital Status:   Intimate Partner Violence:   . Fear of Current or Ex-Partner:   . Emotionally Abused:   [name] Physically Abused:   . Sexually Abused:      Review of Systems    General:  No  chills, fever, night sweats or weight changes.  Cardiovascular:  No chest pain, dyspnea on exertion, edema, orthopnea, palpitations, paroxysmal nocturnal dyspnea. Dermatological: No rash, lesions/masses Respiratory: No cough, dyspnea Urologic: No hematuria, dysuria Abdominal:   No nausea, vomiting, diarrhea, bright red blood per rectum, melena, or hematemesis Neurologic:  No visual changes, wkns, changes in mental status. All other systems reviewed and are otherwise negative except as noted above.  Physical Exam    VS:  BP (!) 156/84   Pulse 63   Ht 4\' 11"  (1.499 m)   Wt 97 lb 3.2 oz (44.1 kg)   LMP  (LMP Unknown)   SpO2 100%   BMI 19.63 kg/m  , BMI Body mass index is 19.63 kg/m. GEN: Well nourished,  well developed, in no acute distress. HEENT: normal. Neck: Supple, no JVD, carotid bruits, or masses. Cardiac: RRR, no murmurs, rubs, or gallops. No clubbing, cyanosis, edema.  Radials/DP/PT 2+ and equal bilaterally.  Respiratory:  Respirations regular and unlabored, clear to auscultation bilaterally. GI: Soft, nontender, nondistended, BS + x 4. MS: no deformity or atrophy. Skin: warm and dry, no rash. Neuro:  Strength and sensation are intact. Psych: Normal affect.  Accessory Clinical Findings    ECG personally reviewed by me today-none today.  Limited echocardiogram 11/05/2019  IMPRESSIONS 1. Left ventricular ejection fraction, by estimation, is 60 to 65%. The  left ventricle has normal function. The left ventricle has no regional  wall motion abnormalities. There is moderate left ventricular hypertrophy.  Left ventricular diastolic  parameters are indeterminate.  2. Moderate pleural effusion in the left lateral region.  3. The mitral valve is degenerative. Moderate mitral annular  calcifications. Moderate mitral valve regurgitation.  4. Aortic dilatation noted. There is dilatation of the ascending aorta  measuring 40 mm.  5. Tricuspid valve regurgitation is mild to moderate.  6. Right ventricular systolic function is mildly reduced. The right  ventricular size is normal. There is moderately elevated pulmonary artery  systolic pressure. The estimated right ventricular systolic pressure is  123456 mmHg.  7. The inferior vena cava is normal in size with <50% respiratory  variability, suggesting right atrial pressure of 8 mmHg.  8. There is a 23 mm Edwards Sapien prosthetic (TAVR) valve present in the  aortic position. Procedure Date: 10/30/19. Mean gradient 80mmHg. Echo  findings are consistent with perivalvular leak of the aortic prosthesis.  Paravalvular leak appears  mild-moderate, unchanged from prior TTE on 2/24.   Comparison(s): 10/31/19 EF 70-75%. PA 58mmHg. AV 91mmHg  mean, 33mmhg peak. Mild-moderate perivalvular AI.    Echocardiogram 11/22/2019 IMPRESSIONS    1. Left ventricular ejection fraction, by estimation, is 55 to 60%. The  left ventricle has normal function. The left ventricle has no regional  wall motion abnormalities. There is mild concentric left ventricular  hypertrophy. Left ventricular diastolic  parameters are indeterminate. Elevated left ventricular end-diastolic  pressure.  2. Right ventricular systolic function is normal. The right ventricular  size is normal. There is mildly elevated pulmonary artery systolic  pressure.  3. Left atrial size was severely dilated.  4. Right atrial size was severely dilated.  5. The mitral valve is normal in structure. Mild mitral valve  regurgitation. No evidence of mitral stenosis.  6. Mild to moderate perivalvular aortic regurgitation unchanged from  prior. The aortic valve has been repaired/replaced. Aortic valve  regurgitation is mild to moderate. No aortic stenosis is present. There is  a 23 mm Edwards Sapien prosthetic (TAVR)  valve present in  the aortic position. Procedure Date: 10/30/19. Aortic  regurgitation PHT measures 358 msec. Aortic valve mean gradient measures  10.0 mmHg. Aortic valve Vmax measures 2.11 m/s.  7. Aortic dilatation noted. There is mild dilatation of the ascending  aorta.  8. The inferior vena cava is normal in size with greater than 50%  respiratory variability, suggesting right atrial pressure of 3 mmHg.  Assessment & Plan   1.  Acute on chronic diastolic heart failure-euvolemic today.  Weight today 97 pounds from 101.8 (12/06/2019) pounds.   Continue furosemide Continue daily weights Heart healthy low-sodium diet-salty 6 given  Paroxysmal atrial fibrillation-heart rate today 63.  Underwent successful DCCV on 12/05/2019.  History of intolerance to amiodarone. Continue Coumadin-monitored by Coumadin clinic Continue metoprolol Continue verapamil Heart  healthy low-sodium diet Avoid triggers caffeine, chocolate, EtOH etc.  Aortic stenosis-status post TAVR February 2021.  Noted to have  Mild to moderate perivalvular aortic regurgitation. Continue Coumadin Followed by valve clinic  Essential hypertension-BP today 156/84.  Better controlled at home 120s-130s over 60s.  Seen and evaluated on 12/06/2019.  Diastolic blood pressure at that time was low.   Medication regimen continued. Continue  Verapamil Start metoprolol succinate 50 mg daily Heart healthy low-sodium diet-salty 6 given Increase physical activity as tolerated  Tachybradycardia syndrome status post PPM-device was interrogated 08/30/2019 showing normal function. Followed by A. fib clinic  Thoracic aortic aneurysm-4.0 cm ectatic aorta. Continue to monitor  Carotid artery disease-mild stenosis noted on carotid Dopplers 08/15/2019. Continue to evaluate annually  Peripheral arterial disease-noted on pre-TAVR CT.  CT showed severe aortic andileofemoral atherosclerosis with multifocal stenosis in the right external iliac.  Previously noted to have leg fatigue with exertion.  Consultation with Dr. Gwenlyn Found postponed due to recovery from cardiac catheterization, TAVR, DCCV, CHF. Continue to monitor  Disposition: Follow-up with Dr. [name] 02/15/2020   Jossie Ng. Janeka Libman NP-C    01/16/2020, 2:44 PM [address] Office [phone] Fax [phone]

## 2020-01-16 ENCOUNTER — Encounter: Payer: Self-pay | Admitting: General Practice

## 2020-01-16 ENCOUNTER — Ambulatory Visit: Payer: Medicare Other | Admitting: General Practice

## 2020-01-16 ENCOUNTER — Other Ambulatory Visit: Payer: Self-pay

## 2020-01-16 VITALS — BP 156/84 | HR 63 | Ht 59.0 in | Wt 97.2 lb

## 2020-01-16 DIAGNOSIS — I350 Nonrheumatic aortic (valve) stenosis: Secondary | ICD-10-CM

## 2020-01-16 DIAGNOSIS — I739 Peripheral vascular disease, unspecified: Secondary | ICD-10-CM

## 2020-01-16 DIAGNOSIS — I10 Essential (primary) hypertension: Secondary | ICD-10-CM

## 2020-01-16 DIAGNOSIS — I712 Thoracic aortic aneurysm, without rupture, unspecified: Secondary | ICD-10-CM

## 2020-01-16 DIAGNOSIS — I480 Paroxysmal atrial fibrillation: Secondary | ICD-10-CM

## 2020-01-16 DIAGNOSIS — I5033 Acute on chronic diastolic (congestive) heart failure: Secondary | ICD-10-CM | POA: Diagnosis not present

## 2020-01-16 DIAGNOSIS — I495 Sick sinus syndrome: Secondary | ICD-10-CM

## 2020-01-16 DIAGNOSIS — I779 Disorder of arteries and arterioles, unspecified: Secondary | ICD-10-CM

## 2020-01-16 MED ORDER — METOPROLOL SUCCINATE ER 50 MG PO TB24: 50.0000 mg | ORAL_TABLET | Freq: Every day | ORAL | 6 refills | Status: DC

## 2020-01-16 NOTE — Patient Instructions (Signed)
Medication Instructions:  STOP METOPROLOL TARTRATE  START METOPROLOL SUCCINATE 50MG  IN THE AFTERNOON *If you need a refill on your cardiac medications before your next appointment, please call your pharmacy*  Special Instructions CONTINUE PHYSICAL ACTIVITY  TAKE AND LOG YOUR BLOOD PRESSURE AND BRING BACK WITH YOU TO YOU FOLLOW UP APPOINTMENT.  PLEASE READ AND FOLLOW SALTY 6-ATTACHED  Follow-Up: Your next appointment:  KEEP SCHEDULED APPOINTMENT  In Person with [name], MD  At [facility], you and your health needs are our priority.  As part of our continuing mission to provide you with exceptional heart care, we have created designated Provider Care Teams.  These Care Teams include your primary Cardiologist (physician) and Advanced Practice Providers (APPs -  Physician Assistants and Nurse Practitioners) who all work together to provide you with the care you need, when you need it.

## 2020-01-18 ENCOUNTER — Other Ambulatory Visit: Payer: Self-pay | Admitting: Internal Medicine

## 2020-01-18 DIAGNOSIS — I480 Paroxysmal atrial fibrillation: Secondary | ICD-10-CM

## 2020-01-18 DIAGNOSIS — I10 Essential (primary) hypertension: Secondary | ICD-10-CM

## 2020-01-22 ENCOUNTER — Other Ambulatory Visit: Payer: Self-pay | Admitting: Internal Medicine

## 2020-01-23 ENCOUNTER — Ambulatory Visit (INDEPENDENT_AMBULATORY_CARE_PROVIDER_SITE_OTHER): Payer: Medicare Other | Admitting: Pharmacist

## 2020-01-23 DIAGNOSIS — H6523 Chronic serous otitis media, bilateral: Secondary | ICD-10-CM | POA: Diagnosis not present

## 2020-01-23 DIAGNOSIS — G2581 Restless legs syndrome: Secondary | ICD-10-CM | POA: Diagnosis not present

## 2020-01-23 DIAGNOSIS — E039 Hypothyroidism, unspecified: Secondary | ICD-10-CM | POA: Diagnosis not present

## 2020-01-23 DIAGNOSIS — Z952 Presence of prosthetic heart valve: Secondary | ICD-10-CM | POA: Diagnosis not present

## 2020-01-23 DIAGNOSIS — I4821 Permanent atrial fibrillation: Secondary | ICD-10-CM

## 2020-01-23 DIAGNOSIS — H5462 Unqualified visual loss, left eye, normal vision right eye: Secondary | ICD-10-CM | POA: Diagnosis not present

## 2020-01-23 DIAGNOSIS — I272 Pulmonary hypertension, unspecified: Secondary | ICD-10-CM | POA: Diagnosis not present

## 2020-01-23 DIAGNOSIS — Z7901 Long term (current) use of anticoagulants: Secondary | ICD-10-CM | POA: Diagnosis not present

## 2020-01-23 DIAGNOSIS — I480 Paroxysmal atrial fibrillation: Secondary | ICD-10-CM | POA: Diagnosis not present

## 2020-01-23 DIAGNOSIS — I5033 Acute on chronic diastolic (congestive) heart failure: Secondary | ICD-10-CM | POA: Diagnosis not present

## 2020-01-23 DIAGNOSIS — I495 Sick sinus syndrome: Secondary | ICD-10-CM | POA: Diagnosis not present

## 2020-01-23 DIAGNOSIS — G5791 Unspecified mononeuropathy of right lower limb: Secondary | ICD-10-CM | POA: Diagnosis not present

## 2020-01-23 DIAGNOSIS — K219 Gastro-esophageal reflux disease without esophagitis: Secondary | ICD-10-CM | POA: Diagnosis not present

## 2020-01-23 DIAGNOSIS — I2510 Atherosclerotic heart disease of native coronary artery without angina pectoris: Secondary | ICD-10-CM | POA: Diagnosis not present

## 2020-01-23 DIAGNOSIS — E559 Vitamin D deficiency, unspecified: Secondary | ICD-10-CM | POA: Diagnosis not present

## 2020-01-23 DIAGNOSIS — N183 Chronic kidney disease, stage 3 unspecified: Secondary | ICD-10-CM | POA: Diagnosis not present

## 2020-01-23 DIAGNOSIS — I081 Rheumatic disorders of both mitral and tricuspid valves: Secondary | ICD-10-CM | POA: Diagnosis not present

## 2020-01-23 DIAGNOSIS — Z950 Presence of cardiac pacemaker: Secondary | ICD-10-CM | POA: Diagnosis not present

## 2020-01-23 DIAGNOSIS — I130 Hypertensive heart and chronic kidney disease with heart failure and stage 1 through stage 4 chronic kidney disease, or unspecified chronic kidney disease: Secondary | ICD-10-CM | POA: Diagnosis not present

## 2020-01-23 DIAGNOSIS — Z5181 Encounter for therapeutic drug level monitoring: Secondary | ICD-10-CM | POA: Diagnosis not present

## 2020-01-23 DIAGNOSIS — E875 Hyperkalemia: Secondary | ICD-10-CM | POA: Diagnosis not present

## 2020-01-23 DIAGNOSIS — K3184 Gastroparesis: Secondary | ICD-10-CM | POA: Diagnosis not present

## 2020-01-23 DIAGNOSIS — Z85038 Personal history of other malignant neoplasm of large intestine: Secondary | ICD-10-CM | POA: Diagnosis not present

## 2020-01-23 DIAGNOSIS — J301 Allergic rhinitis due to pollen: Secondary | ICD-10-CM | POA: Diagnosis not present

## 2020-01-23 DIAGNOSIS — Z48812 Encounter for surgical aftercare following surgery on the circulatory system: Secondary | ICD-10-CM | POA: Diagnosis not present

## 2020-01-23 LAB — POCT INR: INR: 2.1 (ref 2.0–3.0)

## 2020-01-28 ENCOUNTER — Other Ambulatory Visit: Payer: Self-pay | Admitting: Internal Medicine

## 2020-01-28 DIAGNOSIS — I480 Paroxysmal atrial fibrillation: Secondary | ICD-10-CM

## 2020-01-28 DIAGNOSIS — I10 Essential (primary) hypertension: Secondary | ICD-10-CM

## 2020-01-30 DIAGNOSIS — E875 Hyperkalemia: Secondary | ICD-10-CM | POA: Diagnosis not present

## 2020-01-30 DIAGNOSIS — I130 Hypertensive heart and chronic kidney disease with heart failure and stage 1 through stage 4 chronic kidney disease, or unspecified chronic kidney disease: Secondary | ICD-10-CM | POA: Diagnosis not present

## 2020-01-30 DIAGNOSIS — E039 Hypothyroidism, unspecified: Secondary | ICD-10-CM | POA: Diagnosis not present

## 2020-01-30 DIAGNOSIS — Z952 Presence of prosthetic heart valve: Secondary | ICD-10-CM | POA: Diagnosis not present

## 2020-01-30 DIAGNOSIS — Z5181 Encounter for therapeutic drug level monitoring: Secondary | ICD-10-CM | POA: Diagnosis not present

## 2020-01-30 DIAGNOSIS — I081 Rheumatic disorders of both mitral and tricuspid valves: Secondary | ICD-10-CM | POA: Diagnosis not present

## 2020-01-30 DIAGNOSIS — Z48812 Encounter for surgical aftercare following surgery on the circulatory system: Secondary | ICD-10-CM | POA: Diagnosis not present

## 2020-01-30 DIAGNOSIS — I5033 Acute on chronic diastolic (congestive) heart failure: Secondary | ICD-10-CM | POA: Diagnosis not present

## 2020-01-30 DIAGNOSIS — K3184 Gastroparesis: Secondary | ICD-10-CM | POA: Diagnosis not present

## 2020-01-30 DIAGNOSIS — K219 Gastro-esophageal reflux disease without esophagitis: Secondary | ICD-10-CM | POA: Diagnosis not present

## 2020-01-30 DIAGNOSIS — H6523 Chronic serous otitis media, bilateral: Secondary | ICD-10-CM | POA: Diagnosis not present

## 2020-01-30 DIAGNOSIS — I480 Paroxysmal atrial fibrillation: Secondary | ICD-10-CM | POA: Diagnosis not present

## 2020-01-30 DIAGNOSIS — G2581 Restless legs syndrome: Secondary | ICD-10-CM | POA: Diagnosis not present

## 2020-01-30 DIAGNOSIS — Z950 Presence of cardiac pacemaker: Secondary | ICD-10-CM | POA: Diagnosis not present

## 2020-01-30 DIAGNOSIS — I495 Sick sinus syndrome: Secondary | ICD-10-CM | POA: Diagnosis not present

## 2020-01-30 DIAGNOSIS — Z85038 Personal history of other malignant neoplasm of large intestine: Secondary | ICD-10-CM | POA: Diagnosis not present

## 2020-01-30 DIAGNOSIS — N183 Chronic kidney disease, stage 3 unspecified: Secondary | ICD-10-CM | POA: Diagnosis not present

## 2020-01-30 DIAGNOSIS — I272 Pulmonary hypertension, unspecified: Secondary | ICD-10-CM | POA: Diagnosis not present

## 2020-01-30 DIAGNOSIS — E559 Vitamin D deficiency, unspecified: Secondary | ICD-10-CM | POA: Diagnosis not present

## 2020-01-30 DIAGNOSIS — H5462 Unqualified visual loss, left eye, normal vision right eye: Secondary | ICD-10-CM | POA: Diagnosis not present

## 2020-01-30 DIAGNOSIS — G5791 Unspecified mononeuropathy of right lower limb: Secondary | ICD-10-CM | POA: Diagnosis not present

## 2020-01-30 DIAGNOSIS — J301 Allergic rhinitis due to pollen: Secondary | ICD-10-CM | POA: Diagnosis not present

## 2020-01-30 DIAGNOSIS — I2510 Atherosclerotic heart disease of native coronary artery without angina pectoris: Secondary | ICD-10-CM | POA: Diagnosis not present

## 2020-01-30 DIAGNOSIS — Z7901 Long term (current) use of anticoagulants: Secondary | ICD-10-CM | POA: Diagnosis not present

## 2020-02-06 DIAGNOSIS — G5791 Unspecified mononeuropathy of right lower limb: Secondary | ICD-10-CM | POA: Diagnosis not present

## 2020-02-06 DIAGNOSIS — Z952 Presence of prosthetic heart valve: Secondary | ICD-10-CM | POA: Diagnosis not present

## 2020-02-06 DIAGNOSIS — J301 Allergic rhinitis due to pollen: Secondary | ICD-10-CM | POA: Diagnosis not present

## 2020-02-06 DIAGNOSIS — I130 Hypertensive heart and chronic kidney disease with heart failure and stage 1 through stage 4 chronic kidney disease, or unspecified chronic kidney disease: Secondary | ICD-10-CM | POA: Diagnosis not present

## 2020-02-06 DIAGNOSIS — I480 Paroxysmal atrial fibrillation: Secondary | ICD-10-CM | POA: Diagnosis not present

## 2020-02-06 DIAGNOSIS — K3184 Gastroparesis: Secondary | ICD-10-CM | POA: Diagnosis not present

## 2020-02-06 DIAGNOSIS — K219 Gastro-esophageal reflux disease without esophagitis: Secondary | ICD-10-CM | POA: Diagnosis not present

## 2020-02-06 DIAGNOSIS — G2581 Restless legs syndrome: Secondary | ICD-10-CM | POA: Diagnosis not present

## 2020-02-06 DIAGNOSIS — Z85038 Personal history of other malignant neoplasm of large intestine: Secondary | ICD-10-CM | POA: Diagnosis not present

## 2020-02-06 DIAGNOSIS — I2510 Atherosclerotic heart disease of native coronary artery without angina pectoris: Secondary | ICD-10-CM | POA: Diagnosis not present

## 2020-02-06 DIAGNOSIS — N183 Chronic kidney disease, stage 3 unspecified: Secondary | ICD-10-CM | POA: Diagnosis not present

## 2020-02-06 DIAGNOSIS — I495 Sick sinus syndrome: Secondary | ICD-10-CM | POA: Diagnosis not present

## 2020-02-06 DIAGNOSIS — E559 Vitamin D deficiency, unspecified: Secondary | ICD-10-CM | POA: Diagnosis not present

## 2020-02-06 DIAGNOSIS — I5033 Acute on chronic diastolic (congestive) heart failure: Secondary | ICD-10-CM | POA: Diagnosis not present

## 2020-02-06 DIAGNOSIS — I081 Rheumatic disorders of both mitral and tricuspid valves: Secondary | ICD-10-CM | POA: Diagnosis not present

## 2020-02-06 DIAGNOSIS — Z950 Presence of cardiac pacemaker: Secondary | ICD-10-CM | POA: Diagnosis not present

## 2020-02-06 DIAGNOSIS — Z7901 Long term (current) use of anticoagulants: Secondary | ICD-10-CM | POA: Diagnosis not present

## 2020-02-06 DIAGNOSIS — I272 Pulmonary hypertension, unspecified: Secondary | ICD-10-CM | POA: Diagnosis not present

## 2020-02-06 DIAGNOSIS — H5462 Unqualified visual loss, left eye, normal vision right eye: Secondary | ICD-10-CM | POA: Diagnosis not present

## 2020-02-06 DIAGNOSIS — H6523 Chronic serous otitis media, bilateral: Secondary | ICD-10-CM | POA: Diagnosis not present

## 2020-02-06 DIAGNOSIS — Z48812 Encounter for surgical aftercare following surgery on the circulatory system: Secondary | ICD-10-CM | POA: Diagnosis not present

## 2020-02-06 DIAGNOSIS — E039 Hypothyroidism, unspecified: Secondary | ICD-10-CM | POA: Diagnosis not present

## 2020-02-06 DIAGNOSIS — Z5181 Encounter for therapeutic drug level monitoring: Secondary | ICD-10-CM | POA: Diagnosis not present

## 2020-02-06 DIAGNOSIS — E875 Hyperkalemia: Secondary | ICD-10-CM | POA: Diagnosis not present

## 2020-02-11 ENCOUNTER — Encounter: Payer: Self-pay | Admitting: Internal Medicine

## 2020-02-11 NOTE — Patient Instructions (Signed)
Due to recent changes in healthcare laws, you may see the results of your imaging and laboratory studies on MyChart before your provider has had a chance to review them.  We understand that in some cases there may be results that are confusing or concerning to you. Not all laboratory results come back in the same time frame and the provider may be waiting for multiple results in order to interpret others.  Please give [place] 48 hours in order for your provider to thoroughly review all the results before contacting the office for clarification of your results.   +++++++++++++++++++++++++  Vit D  & Vit C 1,000 mg   are recommended to help protect  against the Covid-19 and other Corona viruses.    Also it's recommended  to take  Zinc 50 mg  to help  protect against the Covid-19   and best place to get  is also on [email or web address]  and don't pay more than 6-8 cents /pill !  ================================ Coronavirus (COVID-19) Are you at risk?  Are you at risk for the Coronavirus (COVID-19)?  To be considered HIGH RISK for Coronavirus (COVID-19), you have to meet the following criteria:  . Traveled to [place], [place], [place], [place] or [place]; or in the [place] to [place], [place], [place]  . or [place]; and have fever, cough, and shortness of breath within the last 2 weeks of travel OR . Been in close contact with a person diagnosed with COVID-19 within the last 2 weeks and have  . fever, cough,and shortness of breath .  . IF YOU DO NOT MEET THESE CRITERIA, YOU ARE CONSIDERED LOW RISK FOR COVID-19.  What to do if you are HIGH RISK for COVID-19?  [name] If you are having a medical emergency, call 911. . Seek medical care right away. Before you go to a doctor's office, urgent care or emergency department, .  call ahead and tell them about your recent travel, contact with someone diagnosed with COVID-19  .  and your symptoms.  . You should receive instructions from your physician's  office regarding next steps of care.  . When you arrive at healthcare provider, tell the healthcare staff immediately you have returned from  . visiting [place], [place], [place], [place] or [place]; or traveled in the [place] to [place], [place],  . [place] or [place] in the last two weeks or you have been in close contact with a person diagnosed with  . COVID-19 in the last 2 weeks.   . Tell the health care staff about your symptoms: fever, cough and shortness of breath. . After you have been seen by a medical provider, you will be either: o Tested for (COVID-19) and discharged home on quarantine except to seek medical care if  o symptoms worsen, and asked to  - Stay home and avoid contact with others until you get your results (4-5 days)  - Avoid travel on public transportation if possible (such as bus, train, or airplane) or o Sent to the Emergency Department by EMS for evaluation, COVID-19 testing  and  o possible admission depending on your condition and test results.  What to do if you are LOW RISK for COVID-19?  Reduce your risk of any infection by using the same precautions used for avoiding the common cold or flu:  [name] Wash your hands often with soap and warm water for at least 20 seconds.  If soap and water are not readily  available,  . use an alcohol-based hand sanitizer with at least 60% alcohol.  . If coughing or sneezing, cover your mouth and nose by coughing or sneezing into the elbow areas of your shirt or coat, .  into a tissue or into your sleeve (not your hands). . Avoid shaking hands with others and consider head nods or verbal greetings only. . Avoid touching your eyes, nose, or mouth with unwashed hands.  . Avoid close contact with people who are sick. . Avoid places or events with large numbers of people in one location, like concerts or sporting events. . Carefully consider travel plans you have or are making. . If you are planning any travel outside or  inside the [place], visit the CDC's Travelers' Health webpage for the latest health notices. . If you have some symptoms but not all symptoms, continue to monitor at home and seek medical attention  . if your symptoms worsen. . If you are having a medical emergency, call 911.   . >>>>>>>>>>>>>>>>>>>>>>>>>>>>>>>>> . We Do NOT Approve of  [facility], [employer]  Our Patients  To Do Home Visits & We Do NOT Approve of [employer] > > > > > > > > > > > > > > > > > > > > > > > > > > > > > > > > > > > > > > >  Preventive Care for Adults  A healthy lifestyle and preventive care can promote health and wellness. Preventive health guidelines for women include the following key practices.  A routine yearly physical is a good way to check with your health care provider about your health and preventive screening. It is a chance to share any concerns and updates on your health and to receive a thorough exam.  Visit your dentist for a routine exam and preventive care every 6 months. Brush your teeth twice a day and floss once a day. Good oral hygiene prevents tooth decay and gum disease.  The frequency of eye exams is based on your age, health, family medical history, use of contact lenses, and other factors. Follow your health care provider's recommendations for frequency of eye exams.  Eat a healthy diet. Foods like vegetables, fruits, whole grains, low-fat dairy products, and lean protein foods contain the nutrients you need without too many calories. Decrease your intake of foods high in solid fats, added sugars, and salt. Eat the right amount of calories for you. Get information about a proper diet from your health care provider, if necessary.  Regular physical exercise is one of the most important things you can do for your health. Most adults should get at least 150 minutes of moderate-intensity exercise (any activity that increases your heart rate and causes you to sweat) each  week. In addition, most adults need muscle-strengthening exercises on 2 or more days a week.  Maintain a healthy weight. The body mass index (BMI) is a screening tool to identify possible weight problems. It provides an estimate of body fat based on height and weight. Your health care provider can find your BMI and can help you achieve or maintain a healthy weight. For adults 20 years and older:  A BMI below 18.5 is considered underweight.  A BMI of 18.5 to 24.9 is normal.  A BMI of 25 to 29.9 is considered overweight.  A BMI of 30 and above is considered obese.  Maintain normal blood lipids and cholesterol levels by exercising and minimizing your intake of  saturated fat. Eat a balanced diet with plenty of fruit and vegetables. If your lipid or cholesterol levels are high, you are over 50, or you are at high risk for heart disease, you may need your cholesterol levels checked more frequently. Ongoing high lipid and cholesterol levels should be treated with medicines if diet and exercise are not working.  If you smoke, find out from your health care provider how to quit. If you do not use tobacco, do not start.  Lung cancer screening is recommended for adults aged 78-80 years who are at high risk for developing lung cancer because of a history of smoking. A yearly low-dose CT scan of the lungs is recommended for people who have at least a 30-pack-year history of smoking and are a current smoker or have quit within the past 15 years. A pack year of smoking is smoking an average of 1 pack of cigarettes a day for 1 year (for example: 1 pack a day for 30 years or 2 packs a day for 15 years). Yearly screening should continue until the smoker has stopped smoking for at least 15 years. Yearly screening should be stopped for people who develop a health problem that would prevent them from having lung cancer treatment.  Avoid use of street drugs. Do not share needles with anyone. Ask for help if you need  support or instructions about stopping the use of drugs.  High blood pressure causes heart disease and increases the risk of stroke.  Ongoing high blood pressure should be treated with medicines if weight loss and exercise do not work.  If you are 14-45 years old, ask your health care provider if you should take aspirin to prevent strokes.  Diabetes screening involves taking a blood sample to check your fasting blood sugar level. This should be done once every 3 years, after age 40, if you are within normal weight and without risk factors for diabetes. Testing should be considered at a younger age or be carried out more frequently if you are overweight and have at least 1 risk factor for diabetes.  Breast cancer screening is essential preventive care for women. You should practice "breast self-awareness." This means understanding the normal appearance and feel of your breasts and may include breast self-examination. Any changes detected, no matter how small, should be reported to a health care provider. Women in their 18s and 30s should have a clinical breast exam (CBE) by a health care provider as part of a regular health exam every 1 to 3 years. After age 71, women should have a CBE every year. Starting at age 39, women should consider having a mammogram (breast X-ray test) every year. Women who have a family history of breast cancer should talk to their health care provider about genetic screening. Women at a high risk of breast cancer should talk to their health care providers about having an MRI and a mammogram every year.  Breast cancer gene (BRCA)-related cancer risk assessment is recommended for women who have family members with BRCA-related cancers. BRCA-related cancers include breast, ovarian, tubal, and peritoneal cancers. Having family members with these cancers may be associated with an increased risk for harmful changes (mutations) in the breast cancer genes BRCA1 and BRCA2. Results of the  assessment will determine the need for genetic counseling and BRCA1 and BRCA2 testing.  Routine pelvic exams to screen for cancer are no longer recommended for nonpregnant women who are considered low risk for cancer of the pelvic organs (ovaries,  uterus, and vagina) and who do not have symptoms. Ask your health care provider if a screening pelvic exam is right for you.  If you have had past treatment for cervical cancer or a condition that could lead to cancer, you need Pap tests and screening for cancer for at least 20 years after your treatment. If Pap tests have been discontinued, your risk factors (such as having a new sexual partner) need to be reassessed to determine if screening should be resumed. Some women have medical problems that increase the chance of getting cervical cancer. In these cases, your health care provider may recommend more frequent screening and Pap tests.    Colorectal cancer can be detected and often prevented. Most routine colorectal cancer screening begins at the age of 62 years and continues through age 28 years. However, your health care provider may recommend screening at an earlier age if you have risk factors for colon cancer. On a yearly basis, your health care provider may provide home test kits to check for hidden blood in the stool. Use of a small camera at the end of a tube, to directly examine the colon (sigmoidoscopy or colonoscopy), can detect the earliest forms of colorectal cancer. Talk to your health care provider about this at age 23, when routine screening begins.  Direct exam of the colon should be repeated every 5-10 years through age 31 years, unless early forms of pre-cancerous polyps or small growths are found.  Osteoporosis is a disease in which the bones lose minerals and strength with aging. This can result in serious bone fractures or breaks. The risk of osteoporosis can be identified using a bone density scan. Women ages 83 years and over and women  at risk for fractures or osteoporosis should discuss screening with their health care providers. Ask your health care provider whether you should take a calcium supplement or vitamin D to reduce the rate of osteoporosis.  Menopause can be associated with physical symptoms and risks. Hormone replacement therapy is available to decrease symptoms and risks. You should talk to your health care provider about whether hormone replacement therapy is right for you.  Use sunscreen. Apply sunscreen liberally and repeatedly throughout the day. You should seek shade when your shadow is shorter than you. Protect yourself by wearing long sleeves, pants, a wide-brimmed hat, and sunglasses year round, whenever you are outdoors.  Once a month, do a whole body skin exam, using a mirror to look at the skin on your back. Tell your health care provider of new moles, moles that have irregular borders, moles that are larger than a pencil eraser, or moles that have changed in shape or color.  Stay current with required vaccines (immunizations).  Influenza vaccine. All adults should be immunized every year.  Tetanus, diphtheria, and acellular pertussis (Td, Tdap) vaccine. Pregnant women should receive 1 dose of Tdap vaccine during each pregnancy. The dose should be obtained regardless of the length of time since the last dose. Immunization is preferred during the 27th-36th week of gestation. An adult who has not previously received Tdap or who does not know her vaccine status should receive 1 dose of Tdap. This initial dose should be followed by tetanus and diphtheria toxoids (Td) booster doses every 10 years. Adults with an unknown or incomplete history of completing a 3-dose immunization series with Td-containing vaccines should begin or complete a primary immunization series including a Tdap dose. Adults should receive a Td booster every 10 years.  Zoster vaccine. One dose is recommended for adults aged 64 years or  older unless certain conditions are present.    Pneumococcal 13-valent conjugate (PCV13) vaccine. When indicated, a person who is uncertain of her immunization history and has no record of immunization should receive the PCV13 vaccine. An adult aged 61 years or older who has certain medical conditions and has not been previously immunized should receive 1 dose of PCV13 vaccine. This PCV13 should be followed with a dose of pneumococcal polysaccharide (PPSV23) vaccine. The PPSV23 vaccine dose should be obtained at least 1 or more year(s) after the dose of PCV13 vaccine. An adult aged 46 years or older who has certain medical conditions and previously received 1 or more doses of PPSV23 vaccine should receive 1 dose of PCV13. The PCV13 vaccine dose should be obtained 1 or more years after the last PPSV23 vaccine dose.    Pneumococcal polysaccharide (PPSV23) vaccine. When PCV13 is also indicated, PCV13 should be obtained first. All adults aged 58 years and older should be immunized. An adult younger than age 1 years who has certain medical conditions should be immunized. Any person who resides in a nursing home or long-term care facility should be immunized. An adult smoker should be immunized. People with an immunocompromised condition and certain other conditions should receive both PCV13 and PPSV23 vaccines. People with human immunodeficiency virus (HIV) infection should be immunized as soon as possible after diagnosis. Immunization during chemotherapy or radiation therapy should be avoided. Routine use of PPSV23 vaccine is not recommended for American Indians, [place] Natives, or people younger than 65 years unless there are medical conditions that require PPSV23 vaccine. When indicated, people who have unknown immunization and have no record of immunization should receive PPSV23 vaccine. One-time revaccination 5 years after the first dose of PPSV23 is recommended for people aged 19-64 years who have chronic  kidney failure, nephrotic syndrome, asplenia, or immunocompromised conditions. People who received 1-2 doses of PPSV23 before age 11 years should receive another dose of PPSV23 vaccine at age 70 years or later if at least 5 years have passed since the previous dose. Doses of PPSV23 are not needed for people immunized with PPSV23 at or after age 65 years.   Preventive Services / Frequency  Ages 56 years and over  Blood pressure check.  Lipid and cholesterol check.  Lung cancer screening. / Every year if you are aged 16-80 years and have a 30-pack-year history of smoking and currently smoke or have quit within the past 15 years. Yearly screening is stopped once you have quit smoking for at least 15 years or develop a health problem that would prevent you from having lung cancer treatment.  Clinical breast exam.** / Every year after age 59 years.   BRCA-related cancer risk assessment.** / For women who have family members with a BRCA-related cancer (breast, ovarian, tubal, or peritoneal cancers).  Mammogram.** / Every year beginning at age 70 years and continuing for as long as you are in good health. Consult with your health care provider.  Pap test.** / Every 3 years starting at age 32 years through age 28 or 9 years with 3 consecutive normal Pap tests. Testing can be stopped between 65 and 70 years with 3 consecutive normal Pap tests and no abnormal Pap or HPV tests in the past 10 years.  Fecal occult blood test (FOBT) of stool. / Every year beginning at age 57 years and continuing until age 29 years. You may not need to  do this test if you get a colonoscopy every 10 years.  Flexible sigmoidoscopy or colonoscopy.** / Every 5 years for a flexible sigmoidoscopy or every 10 years for a colonoscopy beginning at age 21 years and continuing until age 56 years.  Hepatitis C blood test.** / For all people born from [birth year] through 1963 and any individual with known risks for hepatitis  C.  Osteoporosis screening.** / A one-time screening for women ages 53 years and over and women at risk for fractures or osteoporosis.  Skin self-exam. / Monthly.  Influenza vaccine. / Every year.  Tetanus, diphtheria, and acellular pertussis (Tdap/Td) vaccine.** / 1 dose of Td every 10 years.  Zoster vaccine.** / 1 dose for adults aged 32 years or older.  Pneumococcal 13-valent conjugate (PCV13) vaccine.** / Consult your health care provider.  Pneumococcal polysaccharide (PPSV23) vaccine.** / 1 dose for all adults aged 67 years and older. Screening for abdominal aortic aneurysm (AAA)  by ultrasound is recommended for people who have history of high blood pressure or who are current or former smokers. ++++++++++++++++++++ Recommend Adult Low Dose Aspirin or  coated  Aspirin 81 mg daily  To reduce risk of Colon Cancer 40 %,  Skin Cancer 26 % ,  Melanoma 46%  and  Pancreatic cancer 60% ++++++++++++++++++++ Vitamin D goal  is between 70-100.  Please make sure that you are taking your Vitamin D as directed.  It is very important as a natural anti-inflammatory  helping hair, skin, and nails, as well as reducing stroke and heart attack risk.  It helps your bones and helps with mood. It also decreases numerous cancer risks so please take it as directed.  Low Vit D is associated with a 200-300% higher risk for CANCER  and 200-300% higher risk for HEART   ATTACK  &  STROKE.   .....................................[name] It is also associated with higher death rate at younger ages,  autoimmune diseases like Rheumatoid arthritis, Lupus, Multiple Sclerosis.    Also many other serious conditions, like depression, Alzheimer's Dementia, infertility, muscle aches, fatigue, fibromyalgia - just to name a few. ++++++++++++++++++ Recommend the book "The END of DIETING" by Dr Excell Seltzer  & the book "The END of DIABETES " by Dr Excell Seltzer At [email or web address] - get book & Audio CD's    Being diabetic has  a  300% increased risk for heart attack, stroke, cancer, and alzheimer- type vascular dementia. It is very important that you work harder with diet by avoiding all foods that are white. Avoid white rice (brown & wild rice is OK), white potatoes (sweetpotatoes in moderation is OK), White bread or wheat bread or anything made out of white flour like bagels, donuts, rolls, buns, biscuits, cakes, pastries, cookies, pizza crust, and pasta (made from white flour & egg whites) - vegetarian pasta or spinach or wheat pasta is OK. Multigrain breads like Arnold's or [employer], or multigrain sandwich thins or flatbreads.  Diet, exercise and weight loss can reverse and cure diabetes in the early stages.  Diet, exercise and weight loss is very important in the control and prevention of complications of diabetes which affects every system in your body, ie. Brain - dementia/stroke, eyes - glaucoma/blindness, heart - heart attack/heart failure, kidneys - dialysis, stomach - gastric paralysis, intestines - malabsorption, nerves - severe painful neuritis, circulation - gangrene & loss of a leg(s), and finally cancer and Alzheimers.    I recommend avoid fried & greasy foods,  sweets/candy, white rice (brown or wild  rice or Quinoa is OK), white potatoes (sweet potatoes are OK) - anything made from white flour - bagels, doughnuts, rolls, buns, biscuits,white and wheat breads, pizza crust and traditional pasta made of white flour & egg white(vegetarian pasta or spinach or wheat pasta is OK).  Multi-grain bread is OK - like multi-grain flat bread or sandwich thins. Avoid alcohol in excess. Exercise is also important.    Eat all the vegetables you want - avoid meat, especially red meat and dairy - especially cheese.  Cheese is the most concentrated form of trans-fats which is the worst thing to clog up our arteries. Veggie cheese is OK which can be found in the fresh produce section at [employer] or [employer] or  [employer]  +++++++++++++++++++ DASH Eating Plan  DASH stands for "Dietary Approaches to Stop Hypertension."   The DASH eating plan is a healthy eating plan that has been shown to reduce high blood pressure (hypertension). Additional health benefits may include reducing the risk of type 2 diabetes mellitus, heart disease, and stroke. The DASH eating plan may also help with weight loss. WHAT DO I NEED TO KNOW ABOUT THE DASH EATING PLAN? For the DASH eating plan, you will follow these general guidelines:  Choose foods with a percent daily value for sodium of less than 5% (as listed on the food label).  Use salt-free seasonings or herbs instead of table salt or sea salt.  Check with your health care provider or pharmacist before using salt substitutes.  Eat lower-sodium products, often labeled as "lower sodium" or "no salt added."  Eat fresh foods.  Eat more vegetables, fruits, and low-fat dairy products.  Choose whole grains. Look for the word "whole" as the first word in the ingredient list.  Choose fish   Limit sweets, desserts, sugars, and sugary drinks.  Choose heart-healthy fats.  Eat veggie cheese   Eat more home-cooked food and less restaurant, buffet, and fast food.  Limit fried foods.  Cook foods using methods other than frying.  Limit canned vegetables. If you do use them, rinse them well to decrease the sodium.  When eating at a restaurant, ask that your food be prepared with less salt, or no salt if possible.                      WHAT FOODS CAN I EAT? Read Dr Fara Olden Fuhrman's books on The End of Dieting & The End of Diabetes  Grains Whole grain or whole wheat bread. Brown rice. Whole grain or whole wheat pasta. Quinoa, bulgur, and whole grain cereals. Low-sodium cereals. Corn or whole wheat flour tortillas. Whole grain cornbread. Whole grain crackers. Low-sodium crackers.  Vegetables Fresh or frozen vegetables (raw, steamed, roasted, or grilled). Low-sodium or  reduced-sodium tomato and vegetable juices. Low-sodium or reduced-sodium tomato sauce and paste. Low-sodium or reduced-sodium canned vegetables.   Fruits All fresh, canned (in natural juice), or frozen fruits.  Protein Products  All fish and seafood.  Dried beans, peas, or lentils. Unsalted nuts and seeds. Unsalted canned beans.  Dairy Low-fat dairy products, such as skim or 1% milk, 2% or reduced-fat cheeses, low-fat ricotta or cottage cheese, or plain low-fat yogurt. Low-sodium or reduced-sodium cheeses.  Fats and Oils Tub margarines without trans fats. Light or reduced-fat mayonnaise and salad dressings (reduced sodium). Avocado. Safflower, olive, or canola oils. Natural peanut or almond butter.  Other Unsalted popcorn and pretzels. The items listed above may not be a complete list of recommended foods  or beverages. Contact your dietitian for more options.  +++++++++++++++  WHAT FOODS ARE NOT RECOMMENDED? Grains/ White flour or wheat flour White bread. White pasta. White rice. Refined cornbread. Bagels and croissants. Crackers that contain trans fat.  Vegetables  Creamed or fried vegetables. Vegetables in a . Regular canned vegetables. Regular canned tomato sauce and paste. Regular tomato and vegetable juices.  Fruits Dried fruits. Canned fruit in light or heavy syrup. Fruit juice.  Meat and Other Protein Products Meat in general - RED meat & White meat.  Fatty cuts of meat. Ribs, chicken wings, all processed meats as bacon, sausage, bologna, salami, fatback, hot dogs, bratwurst and packaged luncheon meats.  Dairy Whole or 2% milk, cream, half-and-half, and cream cheese. Whole-fat or sweetened yogurt. Full-fat cheeses or blue cheese. Non-dairy creamers and whipped toppings. Processed cheese, cheese spreads, or cheese curds.  Condiments Onion and garlic salt, seasoned salt, table salt, and sea salt. Canned and packaged gravies. Worcestershire sauce. Tartar sauce. Barbecue  sauce. Teriyaki sauce. Soy sauce, including reduced sodium. Steak sauce. Fish sauce. Oyster sauce. Cocktail sauce. Horseradish. Ketchup and mustard. Meat flavorings and tenderizers. Bouillon cubes. Hot sauce. Tabasco sauce. Marinades. Taco seasonings. Relishes.  Fats and Oils Butter, stick margarine, lard, shortening and bacon fat. Coconut, palm kernel, or palm oils. Regular salad dressings.  Pickles and olives. Salted popcorn and pretzels.  The items listed above may not be a complete list of foods and beverages to avoid.

## 2020-02-11 NOTE — Progress Notes (Signed)
Annual Screening/Preventative Visit & Comprehensive Evaluation &  Examination     This very nice 86 y.o. WWF presents for a Screening /Preventative Visit & comprehensive evaluation and management of multiple medical co-morbidities.  Patient has been followed for HTN, pAfib / Sick Sinus Syndrome,  hx/o Severe AoS, HLD, Prediabetes  and Vitamin D Deficiency.        Patient also hashx/o Uterine Ca (1994),Transitional CellKidney Ca (2003), Gastric Ca (2017)and  Bladder Ca (2019).         Patient has long-standing hx/o HTN (1960's), pAfib(2008)  (ChadsVasc5) , hx/o acute on chronic sys /dias CHF, PPM (2011) for SSS/Tachy-Brady Syn, who recently underwent underwent a TAVR procedure on 10/30/2019 by Dr's McAlhaney & Halford Chessman.  On 31 March, she underwent  Successful  CV for Afib. Patient remains on Coumadin [facility] at [facility]. She also has ASPVD with reduced PP.  Patient's BP has been controlled at home and patient denies any cardiac symptoms as chest pain, palpitations, shortness of breath, dizziness or ankle swelling. Today's BP was initially elevated & rechecked at goal -  138/84.      Patient's hyperlipidemia is controlled with diet and medications. Patient denies myalgias or other medication SE's. Last lipids were at goal:  Lab Results  Component Value Date   CHOL 113 08/08/2019   HDL 40 (L) 08/08/2019   LDLCALC 54 08/08/2019   TRIG 100 08/08/2019   CHOLHDL 2.8 08/08/2019       Patient has hx/o of PreDiabetes / Insulin resistance since 2012 and patient denies reactive hypoglycemic symptoms, visual blurring, diabetic polys or paresthesias. Last A1c was Normal & at goal:  Lab Results  Component Value Date   HGBA1C 5.4 10/26/2019       Finally, patient has history of Vitamin D Deficiency ("19" / 2008)  and last Vitamin D was still low (goal 70-100):  Lab Results  Component Value Date   VD25OH 43 08/08/2019    Current Outpatient Medications on File Prior to  Visit  Medication Sig  . acetaminophen (TYLENOL) 500 MG tablet Take 500-1,000 mg by mouth every 6 (six) hours as needed for mild pain or headache.   . ALPRAZolam (XANAX) 0.5 MG tablet Take 0.5 mg by mouth 2 (two) times daily as needed for anxiety.   . Alum & Mag Hydroxide-Simeth (MYLANTA PO) Take 20 mLs by mouth every evening.   [name] amoxicillin (AMOXIL) 500 MG tablet Take 4 tablets (2,000 mg total) by mouth as directed. 1 hour prior to dental work including cleanings  . Ascorbic Acid (VITAMIN C) 1000 MG tablet Take 1,000 mg by mouth daily.  . Biotin 10000 MCG TBDP Take 10,000 mcg by mouth daily.  . Cholecalciferol (VITAMIN D-3) 5000 units TABS Take 5,000 Units by mouth 3 (three) times daily.   . Cyanocobalamin 5000 MCG SUBL Place under the tongue.  [name] erythromycin ophthalmic ointment Place 1 application into both eyes at bedtime as needed (stye).   . famotidine (PEPCID) 40 MG tablet Take 1 tablet Daily for Indigestion & Heartburn  . ferrous sulfate 325 (65 FE) MG tablet Take 325 mg by mouth daily in the afternoon.  . furosemide (LASIX) 20 MG tablet Take 1 tablet (20 mg total) by mouth daily.  [name] gabapentin (NEURONTIN) 300 MG capsule Take 300 mg by mouth 2 (two) times daily.  [name] levothyroxine (SYNTHROID) 100 MCG tablet Take 50 mcg by mouth daily before breakfast.  . loperamide (IMODIUM A-D) 2 MG tablet Take up to 12 tablets /  day as needed for  for Diarrhea  . loratadine (CLARITIN) 10 MG tablet Take 10 mg by mouth at bedtime.   . metoprolol succinate (TOPROL-XL) 50 MG 24 hr tablet Take 1 tablet (50 mg total) by mouth daily for 30 doses. Take with or immediately following a meal.  . nitroGLYCERIN (NITROSTAT) 0.4 MG SL tablet Dissolve 1 tablet under tongue every 5 minutes if needed for Angina  . potassium chloride (KLOR-CON) 10 MEQ tablet Take 1 tablet (10 mEq total) by mouth daily.  [name] Propylene Glycol (SYSTANE COMPLETE) 0.6 % SOLN Place 1 drop into both eyes 3 (three) times daily as needed  (irritation/dry eyes.).  [name] simethicone (MYLICON) 80 MG chewable tablet Chew 80 mg by mouth every 6 (six) hours as needed for flatulence.  . sucralfate (CARAFATE) 1 g tablet Take 1 g by mouth 4 (four) times daily.  [name] tobramycin-dexamethasone (TOBRADEX) ophthalmic solution Place 1 drop into both eyes daily as needed (stye).   [name] triamcinolone cream (KENALOG) 0.1 % Apply 1 application topically 2 (two) times daily as needed (rash/skin irritation.).   [name] verapamil (CALAN-SR) 120 MG CR tablet TAKE 1 TABLET 2 X /DAY WITH A MEAL FOR BP & HEART RHYTHM  . warfarin (COUMADIN) 2 MG tablet TAKE 1 TO 2 TABLETS AS DIRECTED TO PREVENT BLOOD CLOTS   No current facility-administered medications on file prior to visit.   Allergies  Allergen Reactions  . Amiodarone Other (See Comments)     Thyroid and liver and kidney problems  . Adhesive [Tape] Other (See Comments)    Tears skin, Please use "paper" tape  . Hydrocodone Other (See Comments)    HALLLUCINATIONS  . Other Other (See Comments)    NO BLOOD PRODUCTS -  Jehoval Witness  . Reglan [Metoclopramide] Hives, Itching and Rash  . Remeron [Mirtazapine] Other (See Comments)    Hallucinations and make pt loopy  . Codeine Nausea And Vomiting  . Morphine And Related Nausea And Vomiting  . Requip [Ropinirole Hcl] Other (See Comments)    Headache   . Zinc Nausea Only   Past Medical History:  Diagnosis Date  . Adenocarcinoma of stomach ([place]) 11/11/2015   gastric mass on egd,   . Anal fissure   . Anemia    hx 3/17 iron infusion, on aranesp and injected B12 since 11/2015.   [name] Anxiety   . Aortic root dilatation ([place])   . Arthritis    oa  . Blind left eye 2015   partial blind in left eye due to stroke   . 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 CAD (coronary artery disease)   . Cancer (HCC)    hx bladder, kidney, uterus  . Cataracts, bilateral   . Chronic kidney disease    only has one kidney rt lft rem 03 ca     dr. Risa Grill, bladder cancer 05-04-2018  . Elevated  LFTs    hepatic steatosis, none recent  . Gastroparesis   . GERD (gastroesophageal reflux disease)   . Hearing loss    wears hearing aids  . History of uterine cancer 1980s   treated with hysterectomy, external radiation and radiation seed implants.   [name] HTN (hypertension)   . Hyperlipemia   . Hypothyroidism   . Iron deficiency anemia due to chronic blood loss 11/24/2015  . Neuropathy    right leg  . Osteomyelitis ([place]) as child  . PAF (paroxysmal atrial fibrillation) ([place])   . Presence of permanent cardiac pacemaker   . Radiation  proctitis   . Refusal of blood transfusions as patient is Jehovah's Witness   . Restless leg syndrome   . 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 S/P TAVR (transcatheter aortic valve replacement)   . Severe aortic stenosis   . Stroke [facility]) 2015   partial stroke left legally blind; left eye   . Tachycardia-bradycardia syndrome ([place])    s/p PPM by Dr Doreatha Lew (MDT) 07/03/10  . Walker as ambulation aid   . Wears glasses   . Wears partial dentures    upper   Health Maintenance  Topic Date Due  . DEXA SCAN  Never done  . INFLUENZA VACCINE  04/06/2020  . COLONOSCOPY  11/10/2020  . TETANUS/TDAP  08/11/2026  . COVID-19 Vaccine  Completed  . PNA vac Low Risk Adult  Completed   Immunization History  Administered Date(s) Administered  . Fluad Quad(high Dose 65+) 05/11/2019  . Influenza, High Dose Seasonal PF 07/18/2015, 06/23/2017, 06/27/2018  . Influenza,inj,Quad PF,6+ Mos 05/31/2014, 06/09/2016  . Influenza-Unspecified 09/16/2009, 07/03/2013  . Janssen (J&J) SARS-COV-2 Vaccination 12/12/2019  . Pneumococcal Conjugate-13 09/06/2002  . Pneumococcal Polysaccharide-23 07/18/2015  . Td 09/06/2002  . Zoster 09/06/2008    Last Colon - 11/11/2015 - Dr Carlean Purl & recc no f/u due to age.  Last MGM - 06/13/2013 and declines f/u.  Past Surgical  History:  Procedure Laterality Date  . CARDIAC CATHETERIZATION  2008  . CARDIOVERSION N/A 12/05/2019   Procedure: CARDIOVERSION;  Surgeon: Elouise Munroe, MD;  Location: [facility] ENDOSCOPY;  Service: Cardiovascular;  Laterality: N/A;  . CHOLECYSTECTOMY  1970s  . COLONOSCOPY N/A 11/11/2015   Procedure: COLONOSCOPY;  Surgeon: [name], MD;  Location: [facility] ENDOSCOPY;  Service: Endoscopy;  Laterality: N/A;  . CYSTOSCOPY W/ RETROGRADES Right 05/01/2018   Procedure: CYSTOSCOPY WITH RIGHT RETROGRADE PYELOGRAM;  Surgeon: Lucas Mallow, MD;  Location: [facility] ORS;  Service: Urology;  Laterality: Right;  . CYSTOSCOPY W/ RETROGRADES Right 07/21/2018   Procedure: CYSTOSCOPY WITH RIGHT RETROGRADE PYELOGRAM;  Surgeon: Alexis Frock, MD;  Location: [facility] ORS;  Service: Urology;  Laterality: Right;  . ESOPHAGOGASTRODUODENOSCOPY N/A 11/11/2015   Procedure: ESOPHAGOGASTRODUODENOSCOPY (EGD);  Surgeon: [name], MD;  Location: [facility];  Service: Endoscopy;  Laterality: N/A;  . ESOPHAGOGASTRODUODENOSCOPY N/A 02/05/2016   Procedure: ESOPHAGOGASTRODUODENOSCOPY (EGD);  Surgeon: Ladene Artist, MD;  Location: [facility] ENDOSCOPY;  Service: Endoscopy;  Laterality: N/A;  . ESOPHAGOGASTRODUODENOSCOPY N/A 04/02/2016   Procedure: ESOPHAGOGASTRODUODENOSCOPY (EGD);  Surgeon: [name], MD;  Location: [facility] ENDOS[facility];  Service: Endoscopy;  Laterality: N/A;  . ESOPHAGOGASTRODUODENOSCOPY (EGD) WITH PROPOFOL N/A 04/06/2017   Procedure: ESOPHAGOGASTRODUODENOSCOPY (EGD) WITH PROPOFOL;  Surgeon: [name], MD;  [facility] ENDOS[facility];  Service: Endoscopy;  Laterality: N/A;  . EYE SURGERY Bilateral    remove cataracts  . GASTRECTOMY N/A 01/15/2016   Procedure: OPEN PARTIAL GASTRECTOMY;  Surgeon: Stark Klein, MD;  Location: [place];  Service: General;  Laterality: N/A;  . GASTRECTOMY Left 02/24/2016   Procedure: OPEN J TUBE;  Surgeon: Stark Klein, MD;  Location: [place];  Service: General;  Laterality: Left;  [name] GASTROJEJUNOSTOMY  N/A 05/18/2016   Procedure: ROUX EN Y GASTROJEJUNOSTOMY;  Surgeon: Stark Klein, MD;  Location: [place];  Service: General;  Laterality: N/A;  . I & D KNEE WITH POLY EXCHANGE Right 12/17/2013   Procedure: IRRIGATION AND DEBRIDEMEN RIGHT TOTAL  KNEE WITH POLY EXCHANGE;  Surgeon: Mauri Pole, MD;  Location: [facility];  Service: Orthopedics;  Laterality: Right;  . IR GENERIC HISTORICAL  06/17/2016   IR RADIOLOGIST EVAL & MGMT 06/17/2016 Ascencion Dike, PA-C [facility] INTERV RAD  . LAPAROSCOPY N/A 01/15/2016   Procedure: LAPAROSCOPY DIAGNOSTIC;  Surgeon: Stark Klein, MD;  Location: [place];  Service: General;  Laterality: N/A;  . LYSIS OF ADHESION  05/18/2016   Procedure: LYSIS OF ADHESION;  Surgeon: Stark Klein, MD;  Location: [place];  Service: General;;  . NEPHRECTOMY Left    renal cell cancer  . PACEMAKER INSERTION  07/04/2011   MDT PPM implanted by Dr Doreatha Lew for tachy/brady; managed by Dr Thompson Grayer   . PATELLAR TENDON REPAIR Right 03/06/2014   Procedure: AVULSION WITH PATELLA TENDON REPAIR;  Surgeon: Mauri Pole, MD;  [facility];  Service: Orthopedics;  Laterality: Right;  . PATENT DUCTUS ARTERIOUS REPAIR  ~ 1949  . RIGHT/LEFT HEART CATH AND CORONARY ANGIOGRAPHY N/A 10/09/2019   Procedure: RIGHT/LEFT HEART CATH AND CORONARY ANGIOGRAPHY;  Surgeon: [name], [name], MD;  Location: [place] CV LAB;  Service: Cardiovascular;  Laterality: N/A;  . TEE WITHOUT CARDIOVERSION N/A 10/30/2019   Procedure: TRANSESOPHAGEAL ECHOCARDIOGRAM (TEE);  Surgeon: Burnell Blanks, MD;  Location: [place];  Service: Open Heart Surgery;  Laterality: N/A;  . TOTAL ABDOMINAL HYSTERECTOMY  85  . TOTAL HIP REVISION Right 2009   initial replacment surg  . TOTAL KNEE ARTHROPLASTY  02/07/2012   Procedure: TOTAL KNEE ARTHROPLASTY;  Surgeon: Mauri Pole, MD;  [facility];  Service: Orthopedics;  Laterality: Right;  . TOTAL KNEE REVISION Right 07/29/2014   Procedure: RIGHT KNEE REVISION OF PREVIOUS REPAIR EXTENSOR  MECHANISM;  Surgeon: Mauri Pole, MD;  [facility];  Service: Orthopedics;  Laterality: Right;  . TRANSCATHETER AORTIC VALVE REPLACEMENT, CAROTID Left 10/30/2019   TRANSCATHETER AORTIC VALVE REPLACEMENT, LEFT CAROTID (Left Neck)   . TRANSCATHETER AORTIC VALVE REPLACEMENT, CAROTID Left 10/30/2019   Procedure: TRANSCATHETER AORTIC VALVE REPLACEMENT, LEFT CAROTID;  Surgeon: Burnell Blanks, MD;  Location: [place];  Service: Open Heart Surgery;  Laterality: Left;  . TRANSURETHRAL RESECTION OF BLADDER TUMOR N/A 07/21/2018   Procedure: TRANSURETHRAL RESECTION OF BLADDER TUMOR (TURBT);  Surgeon: Alexis Frock, MD;  [facility];  Service: Urology;  Laterality: N/A;  . TRANSURETHRAL RESECTION OF BLADDER TUMOR WITH MITOMYCIN-C N/A 05/01/2018   Procedure: TRANSURETHRAL RESECTION OF BLADDER TUMOR WITH POST OP INSTILLATION OF GEMCITABINE;  Surgeon: Lucas Mallow, MD;  [facility];  Service: Urology;  Laterality: N/A;  . WHIPPLE PROCEDURE N/A 05/18/2016   Procedure: EXPLORATORY LAPAROTOMY;  Surgeon: Stark Klein, MD;  Location[facility];  Service: General;  Laterality: N/A;   Family History  Problem Relation Age of Onset  . Colon cancer Mother 4  . Heart disease Mother   . Heart disease Father   . Heart attack Father   . Heart disease Maternal Grandmother    Social History   Tobacco Use  . Smoking status: Never Smoker  . Smokeless tobacco: Never Used  Substance Use Topics  . Alcohol use: No  . Drug use: No    ROS Constitutional: Denies fever, chills, weight loss/gain, headaches, insomnia,  night sweats, and change in appetite. Does c/o fatigue. Eyes: Denies redness, blurred vision, diplopia, discharge, itchy, watery eyes.  ENT: Denies discharge, congestion, post nasal drip, epistaxis, sore throat, earache, hearing loss, dental pain, Tinnitus, Vertigo, Sinus pain, snoring.  Cardio: Denies chest pain, palpitations,  irregular heartbeat, syncope, dyspnea, diaphoresis,  orthopnea, PND, claudication, edema Respiratory: denies cough, dyspnea, DOE, pleurisy, hoarseness, laryngitis, wheezing.  Gastrointestinal: Denies dysphagia, heartburn, reflux, water brash, pain, cramps, nausea, vomiting, bloating, diarrhea, constipation, hematemesis, melena, hematochezia, jaundice, hemorrhoids Genitourinary: Denies dysuria, frequency, urgency, nocturia, hesitancy, discharge, hematuria, flank pain Breast: Breast lumps, nipple discharge, bleeding.  Musculoskeletal: Denies arthralgia, myalgia, stiffness, Jt. Swelling, pain, limp, and strain/sprain. Denies falls. Skin: Denies puritis, rash, hives, warts, acne, eczema, changing in skin lesion Neuro: No weakness, tremor, incoordination, spasms, paresthesia, pain Psychiatric: Denies confusion, memory loss, sensory loss. Denies Depression. Endocrine: Denies change in weight, skin, hair change, nocturia, and paresthesia, diabetic polys, visual blurring, hyper / hypo glycemic episodes.  Heme/Lymph: No excessive bleeding, bruising, enlarged lymph nodes.  Physical Exam  BP 138/84   Pulse 76   Temp (!) 97.2 F (36.2 C)   Resp 16   Ht 4\' 11"  (1.499 m)   Wt 95 lb (43.1 kg)   LMP  (LMP Unknown)   BMI 19.19 kg/m   General Appearance: Well nourished, well groomed and in no apparent distress.  Eyes: PERRLA, EOMs, conjunctiva no swelling or erythema, normal fundi and vessels. Sinuses: No frontal/maxillary tenderness ENT/Mouth: EACs patent / TMs  nl. Nares clear without erythema, swelling, mucoid exudates. Oral hygiene is good. No erythema, swelling, or exudate. Tongue normal, non-obstructing. Tonsils not swollen or erythematous. Hearing normal.  Neck: Supple, thyroid not palpable. No bruits, nodes or JVD. Respiratory: Respiratory effort normal.  BS equal and clear bilateral without rales, rhonci, wheezing or stridor. Cardio: Heart sounds are normal with regular rate and rhythm and Gr 2 soft blowing systolic & diastolic murmer maximal in  the Left parasternal area.   Pedal pulses are  1(+)  and equal bilaterally without edema. No aortic or femoral bruits. Chest: symmetric with normal excursions and percussion. Breasts:Deferred Abdomen: Flat, soft with bowel sounds active. Nontender, no guarding, rebound, hernias, masses, or organomegaly.  Lymphatics: Non tender without lymphadenopathy.  Musculoskeletal: Full ROM all peripheral extremities, joint stability, 5/5 strength, and normal gaitsupported by a walker. Skin: Warm and dry without rashes, lesions, cyanosis, clubbing or  ecchymosis.  Neuro: Cranial nerves intact, reflexes equal bilaterally. Normal muscle tone, no cerebellar symptoms. Sensation intact.  Pysch: Alert and oriented X 3, normal affect, Insight and Judgment appropriate.   Assessment and Plan  1. Annual Preventative Screening Examination  2. Essential hypertension  - EKG 12-Lead - Urinalysis, Routine w reflex microscopic - Microalbumin / creatinine urine ratio - CBC with Differential/Platelet - COMPLETE METABOLIC PANEL WITH GFR - Magnesium - TSH  3. Hyperlipidemia, mixed  - EKG 12-Lead - Lipid panel - TSH  4. Abnormal glucose  - EKG 12-Lead - Hemoglobin A1c - Insulin, random  5. Vitamin D deficiency  - VITAMIN D 25 Hydroxy  6. Hypothyroidism  - TSH  7. Paroxysmal atrial fibrillation (HCC)  - EKG 12-Lead - TSH  8. S/P TAVR (transcatheter aortic valve replacement)  - EKG 12-Lead  9. Cardiac pacemaker   - EKG 12-Lead  10. Tachycardia-bradycardia syndrome ([place])  - EKG 12-Lead  11. Gastroesophageal reflux disease with esophagitis  - CBC with Differential/Platelet  12. Screening for ischemic heart disease  - EKG 12-Lead  13. FHx: heart disease  - EKG 12-Lead  14. Long term current use of anticoagulant therapy   15. Medication management  - Urinalysis, Routine w reflex microscopic - Microalbumin / creatinine urine ratio - CBC with Differential/Platelet - COMPLETE  METABOLIC PANEL WITH GFR - Magnesium -  Lipid panel - TSH - Hemoglobin A1c - Insulin, random - VITAMIN D 25 Hydroxy  16. Screening for colorectal cancer  - POC Hemoccult Bld/St        Patient was counseled in prudent diet to achieve/maintain BMI less than 25 for weight control, BP monitoring, regular exercise and medications. Discussed med's effects and SE's. Screening labs and tests as requested with regular follow-up as recommended. Over 40 minutes of exam, counseling, chart review and high complex critical decision making was performed.   Kirtland Bouchard, MD

## 2020-02-12 ENCOUNTER — Encounter: Payer: Self-pay | Admitting: Internal Medicine

## 2020-02-12 ENCOUNTER — Ambulatory Visit (INDEPENDENT_AMBULATORY_CARE_PROVIDER_SITE_OTHER): Payer: Medicare Other | Admitting: Internal Medicine

## 2020-02-12 ENCOUNTER — Other Ambulatory Visit: Payer: Self-pay

## 2020-02-12 VITALS — BP 138/84 | HR 76 | Temp 97.2°F | Resp 16 | Ht 59.0 in | Wt 95.0 lb

## 2020-02-12 DIAGNOSIS — Z136 Encounter for screening for cardiovascular disorders: Secondary | ICD-10-CM

## 2020-02-12 DIAGNOSIS — E039 Hypothyroidism, unspecified: Secondary | ICD-10-CM | POA: Diagnosis not present

## 2020-02-12 DIAGNOSIS — K2100 Gastro-esophageal reflux disease with esophagitis, without bleeding: Secondary | ICD-10-CM

## 2020-02-12 DIAGNOSIS — E782 Mixed hyperlipidemia: Secondary | ICD-10-CM

## 2020-02-12 DIAGNOSIS — E559 Vitamin D deficiency, unspecified: Secondary | ICD-10-CM | POA: Diagnosis not present

## 2020-02-12 DIAGNOSIS — I10 Essential (primary) hypertension: Secondary | ICD-10-CM

## 2020-02-12 DIAGNOSIS — Z0001 Encounter for general adult medical examination with abnormal findings: Secondary | ICD-10-CM

## 2020-02-12 DIAGNOSIS — Z1211 Encounter for screening for malignant neoplasm of colon: Secondary | ICD-10-CM

## 2020-02-12 DIAGNOSIS — Z952 Presence of prosthetic heart valve: Secondary | ICD-10-CM

## 2020-02-12 DIAGNOSIS — Z0000 Encounter for general adult medical examination without abnormal findings: Secondary | ICD-10-CM | POA: Diagnosis not present

## 2020-02-12 DIAGNOSIS — Z950 Presence of cardiac pacemaker: Secondary | ICD-10-CM

## 2020-02-12 DIAGNOSIS — R7309 Other abnormal glucose: Secondary | ICD-10-CM | POA: Diagnosis not present

## 2020-02-12 DIAGNOSIS — Z8249 Family history of ischemic heart disease and other diseases of the circulatory system: Secondary | ICD-10-CM

## 2020-02-12 DIAGNOSIS — I480 Paroxysmal atrial fibrillation: Secondary | ICD-10-CM

## 2020-02-12 DIAGNOSIS — Z79899 Other long term (current) drug therapy: Secondary | ICD-10-CM

## 2020-02-12 DIAGNOSIS — Z7901 Long term (current) use of anticoagulants: Secondary | ICD-10-CM

## 2020-02-12 DIAGNOSIS — I495 Sick sinus syndrome: Secondary | ICD-10-CM

## 2020-02-12 MED ORDER — ONDANSETRON 8 MG PO TBDP: ORAL_TABLET | ORAL | 3 refills | Status: AC

## 2020-02-13 ENCOUNTER — Other Ambulatory Visit: Payer: Self-pay | Admitting: Internal Medicine

## 2020-02-13 ENCOUNTER — Ambulatory Visit (INDEPENDENT_AMBULATORY_CARE_PROVIDER_SITE_OTHER): Payer: Medicare Other | Admitting: Pharmacist

## 2020-02-13 DIAGNOSIS — K219 Gastro-esophageal reflux disease without esophagitis: Secondary | ICD-10-CM | POA: Diagnosis not present

## 2020-02-13 DIAGNOSIS — Z48812 Encounter for surgical aftercare following surgery on the circulatory system: Secondary | ICD-10-CM | POA: Diagnosis not present

## 2020-02-13 DIAGNOSIS — Z7901 Long term (current) use of anticoagulants: Secondary | ICD-10-CM | POA: Diagnosis not present

## 2020-02-13 DIAGNOSIS — G5791 Unspecified mononeuropathy of right lower limb: Secondary | ICD-10-CM | POA: Diagnosis not present

## 2020-02-13 DIAGNOSIS — Z5181 Encounter for therapeutic drug level monitoring: Secondary | ICD-10-CM | POA: Diagnosis not present

## 2020-02-13 DIAGNOSIS — G2581 Restless legs syndrome: Secondary | ICD-10-CM | POA: Diagnosis not present

## 2020-02-13 DIAGNOSIS — K3184 Gastroparesis: Secondary | ICD-10-CM | POA: Diagnosis not present

## 2020-02-13 DIAGNOSIS — Z952 Presence of prosthetic heart valve: Secondary | ICD-10-CM | POA: Diagnosis not present

## 2020-02-13 DIAGNOSIS — I4821 Permanent atrial fibrillation: Secondary | ICD-10-CM | POA: Diagnosis not present

## 2020-02-13 DIAGNOSIS — J301 Allergic rhinitis due to pollen: Secondary | ICD-10-CM | POA: Diagnosis not present

## 2020-02-13 DIAGNOSIS — H6523 Chronic serous otitis media, bilateral: Secondary | ICD-10-CM | POA: Diagnosis not present

## 2020-02-13 DIAGNOSIS — E875 Hyperkalemia: Secondary | ICD-10-CM | POA: Diagnosis not present

## 2020-02-13 DIAGNOSIS — N3000 Acute cystitis without hematuria: Secondary | ICD-10-CM

## 2020-02-13 DIAGNOSIS — I081 Rheumatic disorders of both mitral and tricuspid valves: Secondary | ICD-10-CM | POA: Diagnosis not present

## 2020-02-13 DIAGNOSIS — N183 Chronic kidney disease, stage 3 unspecified: Secondary | ICD-10-CM | POA: Diagnosis not present

## 2020-02-13 DIAGNOSIS — I130 Hypertensive heart and chronic kidney disease with heart failure and stage 1 through stage 4 chronic kidney disease, or unspecified chronic kidney disease: Secondary | ICD-10-CM | POA: Diagnosis not present

## 2020-02-13 DIAGNOSIS — Z950 Presence of cardiac pacemaker: Secondary | ICD-10-CM | POA: Diagnosis not present

## 2020-02-13 DIAGNOSIS — E039 Hypothyroidism, unspecified: Secondary | ICD-10-CM | POA: Diagnosis not present

## 2020-02-13 DIAGNOSIS — E559 Vitamin D deficiency, unspecified: Secondary | ICD-10-CM | POA: Diagnosis not present

## 2020-02-13 DIAGNOSIS — I495 Sick sinus syndrome: Secondary | ICD-10-CM | POA: Diagnosis not present

## 2020-02-13 DIAGNOSIS — I5033 Acute on chronic diastolic (congestive) heart failure: Secondary | ICD-10-CM | POA: Diagnosis not present

## 2020-02-13 DIAGNOSIS — I272 Pulmonary hypertension, unspecified: Secondary | ICD-10-CM | POA: Diagnosis not present

## 2020-02-13 DIAGNOSIS — Z85038 Personal history of other malignant neoplasm of large intestine: Secondary | ICD-10-CM | POA: Diagnosis not present

## 2020-02-13 DIAGNOSIS — H5462 Unqualified visual loss, left eye, normal vision right eye: Secondary | ICD-10-CM | POA: Diagnosis not present

## 2020-02-13 DIAGNOSIS — I2510 Atherosclerotic heart disease of native coronary artery without angina pectoris: Secondary | ICD-10-CM | POA: Diagnosis not present

## 2020-02-13 DIAGNOSIS — I480 Paroxysmal atrial fibrillation: Secondary | ICD-10-CM | POA: Diagnosis not present

## 2020-02-13 LAB — CBC WITH DIFFERENTIAL/PLATELET
Absolute Monocytes: 464 cells/uL (ref 200–950)
Basophils Absolute: 31 cells/uL (ref 0–200)
Basophils Relative: 0.5 %
Eosinophils Absolute: 214 cells/uL (ref 15–500)
Eosinophils Relative: 3.5 %
HCT: 37.8 % (ref 35.0–45.0)
Hemoglobin: 12.5 g/dL (ref 11.7–15.5)
Lymphs Abs: 1525 cells/uL (ref 850–3900)
MCH: 33.3 pg — ABNORMAL HIGH (ref 27.0–33.0)
MCHC: 33.1 g/dL (ref 32.0–36.0)
MCV: 100.8 fL — ABNORMAL HIGH (ref 80.0–100.0)
MPV: 10.6 fL (ref 7.5–12.5)
Monocytes Relative: 7.6 %
Neutro Abs: 3867 cells/uL (ref 1500–7800)
Neutrophils Relative %: 63.4 %
Platelets: 158 10*3/uL (ref 140–400)
RBC: 3.75 10*6/uL — ABNORMAL LOW (ref 3.80–5.10)
RDW: 12.5 % (ref 11.0–15.0)
Total Lymphocyte: 25 %
WBC: 6.1 10*3/uL (ref 3.8–10.8)

## 2020-02-13 LAB — COMPLETE METABOLIC PANEL WITH GFR
AG Ratio: 1.7 (calc) (ref 1.0–2.5)
ALT: 22 U/L (ref 6–29)
AST: 22 U/L (ref 10–35)
Albumin: 3.8 g/dL (ref 3.6–5.1)
Alkaline phosphatase (APISO): 73 U/L (ref 37–153)
BUN/Creatinine Ratio: 21 (calc) (ref 6–22)
BUN: 22 mg/dL (ref 7–25)
CO2: 29 mmol/L (ref 20–32)
Calcium: 9.3 mg/dL (ref 8.6–10.4)
Chloride: 106 mmol/L (ref 98–110)
Creat: 1.03 mg/dL — ABNORMAL HIGH (ref 0.60–0.88)
GFR, Est African American: 58 mL/min/{1.73_m2} — ABNORMAL LOW (ref >=60)
GFR, Est Non African American: 50 mL/min/{1.73_m2} — ABNORMAL LOW (ref >=60)
Globulin: 2.2 g/dL (calc) (ref 1.9–3.7)
Glucose, Bld: 73 mg/dL (ref 65–99)
Potassium: 4.4 mmol/L (ref 3.5–5.3)
Sodium: 144 mmol/L (ref 135–146)
Total Bilirubin: 0.4 mg/dL (ref 0.2–1.2)
Total Protein: 6 g/dL — ABNORMAL LOW (ref 6.1–8.1)

## 2020-02-13 LAB — INSULIN, RANDOM: Insulin: 3.1 u[IU]/mL

## 2020-02-13 LAB — HEMOGLOBIN A1C
Hgb A1c MFr Bld: 4.6 % of total Hgb (ref <5.7)
Mean Plasma Glucose: 85 (calc)
eAG (mmol/L): 4.7 (calc)

## 2020-02-13 LAB — VITAMIN D 25 HYDROXY (VIT D DEFICIENCY, FRACTURES): Vit D, 25-Hydroxy: 65 ng/mL (ref 30–100)

## 2020-02-13 LAB — URINALYSIS, ROUTINE W REFLEX MICROSCOPIC
Bilirubin Urine: NEGATIVE
Glucose, UA: NEGATIVE
Hgb urine dipstick: NEGATIVE
Hyaline Cast: NONE SEEN /LPF
Ketones, ur: NEGATIVE
Nitrite: POSITIVE — AB
Protein, ur: NEGATIVE
RBC / HPF: NONE SEEN /HPF (ref 0–2)
Specific Gravity, Urine: 1.011 (ref 1.001–1.03)
pH: <=5 (ref 5.0–8.0)

## 2020-02-13 LAB — LIPID PANEL
Cholesterol: 135 mg/dL (ref <200)
HDL: 41 mg/dL — ABNORMAL LOW (ref >=50)
LDL Cholesterol (Calc): 74 mg/dL (calc)
Non-HDL Cholesterol (Calc): 94 mg/dL (calc) (ref <130)
Total CHOL/HDL Ratio: 3.3 (calc) (ref <5.0)
Triglycerides: 114 mg/dL (ref <150)

## 2020-02-13 LAB — MICROALBUMIN / CREATININE URINE RATIO
Creatinine, Urine: 31 mg/dL (ref 20–275)
Microalb Creat Ratio: 19 mcg/mg creat (ref <30)
Microalb, Ur: 0.6 mg/dL

## 2020-02-13 LAB — TSH: TSH: 1.5 mIU/L (ref 0.40–4.50)

## 2020-02-13 LAB — POCT INR: INR: 2.6 (ref 2.0–3.0)

## 2020-02-13 LAB — MAGNESIUM: Magnesium: 1.9 mg/dL (ref 1.5–2.5)

## 2020-02-14 ENCOUNTER — Other Ambulatory Visit: Payer: Self-pay

## 2020-02-14 DIAGNOSIS — N3000 Acute cystitis without hematuria: Secondary | ICD-10-CM | POA: Diagnosis not present

## 2020-02-14 NOTE — Progress Notes (Signed)
Cardiology Office Note   Date:  02/15/2020   ID:  [name], DOB [birth year], MRN [id number]  PCP:  [name], MD  Cardiologist:  Tanay Misuraca [name], MD EP: Thompson Grayer, MD  Chief Complaint  Patient presents with  . Aortic Stenosis      History of Present Illness: [name] is a 86 y.o. 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  She was last evaluated in December 2020 with  Repeat Echo study which showed EF 60-65%, moderate LVH, unable to assess LV diastolic function, no RWMA, mild LAE, mild MR, and severely calcified AV with suspected low-flow low gradient moderate-severe AS and moderate AI. She reported increased DOE requiring more lasix, as well at increased heart rates. On review of her echocardiogram, suspect she is back in atrial fibrillation. She had increased edema.    Diagnostic cardiac catheterization was performed  on October 09, 2019 revealed nonobstructive coronary artery disease. Peak to peak and mean transvalvular gradients across aortic valve were measured 19 and 11.4 mmHg respectively corresponding to aortic valve area calculated only 0.84 cm.  The patient was evaluated by the multidisciplinary valve team and underwent successful TAVR with a59mm Edwards Sapien 3 UltraTHV via the left carotidapproach on 10/30/19. Post operative echoshowed EF 70% s/p TAVR with mild-mod PVL and mean gradient of 7 mm Hg. Of note, pre op labs showed a markedly elevated BNP >2000 and some mild pulmonary congestion on CXR. There was some concern for potential valve migration. However, the patient was feeling well and was discharged on POD 1 (10/31/19). She was discharged on coumadin alone given high  risk of bleed.   She subsequently called with  acutely worsening shortness of breath, chest pressure, weight gain and orthopnea. She did improve with increase in lasix to 40 mg daily.  Limited echo 11/05/19 showed stable mild-mod PVL. BP was high and amlodipine was added. When seen in valve clinic on March 18 she was in Afib. She underwent DCCV  12/05/19.   She was seen in May by Coletta Memos NP. Noted elevated BP and was having intermittent elevated heart rates. Metoprolol 50 mg was added. Last pacer check in April showed no recurrent Afib.   On follow up today she is seen with her daughter. She is doing much better from a cardiac standpoint. Noted she could walk all the way from parking to her appointment today without dyspnea. no swelling. Weight is down. No racing or palpitations. Her right leg does feel heavy and numb with walking but no pain or sores. She did experience some rectal bleeding earlier this week. INR therapeutic at 2.6. Felt this might be related to eating too much watermelon. Reviewed BP diary and although there is some fluctuation her BP is fairly well controlled.    Past Medical History:  Diagnosis Date  . Adenocarcinoma of stomach ([place]) 11/11/2015   gastric mass on egd,   . Anal fissure   . Anemia    hx 3/17 iron infusion, on aranesp and injected B12 since 11/2015.   [name] Anxiety   . Aortic root dilatation ([place])   . Arthritis    oa  . Blind left eye 2015   partial blind in left eye due to stroke   .  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 CAD (coronary artery disease)   . Cancer (HCC)    hx bladder, kidney, uterus  . Cataracts, bilateral   . Chronic kidney disease    only has one kidney rt lft rem 03 ca     dr. Risa Grill, bladder cancer 05-04-2018  . Elevated LFTs    hepatic steatosis, none recent  . Gastroparesis   . GERD (gastroesophageal reflux disease)   . Hearing loss    wears hearing aids  . History of uterine cancer 1980s   treated with hysterectomy, external radiation and  radiation seed implants.   [name] HTN (hypertension)   . Hyperlipemia   . Hypothyroidism   . Iron deficiency anemia due to chronic blood loss 11/24/2015  . Neuropathy    right leg  . Osteomyelitis ([place]) as child  . PAF (paroxysmal atrial fibrillation) ([place])   . Presence of permanent cardiac pacemaker   . Radiation proctitis   . Refusal of blood transfusions as patient is Jehovah's Witness   . Restless leg syndrome   . 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 S/P TAVR (transcatheter aortic valve replacement)   . Severe aortic stenosis   . Stroke [facility]) 2015   partial stroke left legally blind; left eye   . Tachycardia-bradycardia syndrome ([place])    s/p PPM by Dr Doreatha Lew (MDT) 07/03/10  . Walker as ambulation aid   . Wears glasses   . Wears partial dentures    upper    Past Surgical History:  Procedure Laterality Date  . CARDIAC CATHETERIZATION  2008  . CARDIOVERSION N/A 12/05/2019   Procedure: CARDIOVERSION;  Surgeon: Elouise Munroe, MD;  Location: [facility] ENDOSCOPY;  Service: Cardiovascular;  Laterality: N/A;  . CHOLECYSTECTOMY  1970s  . COLONOSCOPY N/A 11/11/2015   Procedure: COLONOSCOPY;  Surgeon: [name], MD;  Location: [facility] ENDOSCOPY;  Service: Endoscopy;  Laterality: N/A;  . CYSTOSCOPY W/ RETROGRADES Right 05/01/2018   Procedure: CYSTOSCOPY WITH RIGHT RETROGRADE PYELOGRAM;  Surgeon: Lucas Mallow, MD;  Location: [facility] ORS;  Service: Urology;  Laterality: Right;  . CYSTOSCOPY W/ RETROGRADES Right 07/21/2018   Procedure: CYSTOSCOPY WITH RIGHT RETROGRADE PYELOGRAM;  Surgeon: Alexis Frock, MD;  Location: [facility] ORS;  Service: Urology;  Laterality: Right;  . ESOPHAGOGASTRODUODENOSCOPY N/A 11/11/2015   Procedure: ESOPHAGOGASTRODUODENOSCOPY (EGD);  Surgeon: [name], MD;  Location: [facility] ENDOSCOPY;  Service: Endoscopy;  Laterality: N/A;  . ESOPHAGOGASTRODUODENOSCOPY N/A  02/05/2016   Procedure: ESOPHAGOGASTRODUODENOSCOPY (EGD);  Surgeon: Ladene Artist, MD;  Location: [facility] ENDOSCOPY;  Service: Endoscopy;  Laterality: N/A;  . ESOPHAGOGASTRODUODENOSCOPY N/A 04/02/2016   Procedure: ESOPHAGOGASTRODUODENOSCOPY (EGD);  Surgeon: [name], MD;  Location: [facility] ENDOSCOPY;  Service: Endoscopy;  Laterality: N/A;  . ESOPHAGOGASTRODUODENOSCOPY (EGD) WITH PROPOFOL N/A 04/06/2017   Procedure: ESOPHAGOGASTRODUODENOSCOPY (EGD) WITH PROPOFOL;  Surgeon: [name], MD;  [facility]: [facility] ENDOSCOPY;  Service: Endoscopy;  Laterality: N/A;  . EYE SURGERY Bilateral    remove cataracts  . GASTRECTOMY N/A 01/15/2016   Procedure: OPEN PARTIAL GASTRECTOMY;  Surgeon: Stark Klein, MD;  Location: [place];  Service: General;  Laterality: N/A;  . GASTRECTOMY Left 02/24/2016   Procedure: OPEN J TUBE;  Surgeon: Stark Klein, MD;  Location: [place];  Service: General;  Laterality: Left;  [name] GASTROJEJUNOSTOMY N/A 05/18/2016   Procedure: ROUX EN [place];  Surgeon: Stark Klein, MD;  Location: [place];  Service: General;  Laterality: N/A;  . I & D KNEE WITH POLY EXCHANGE Right 12/17/2013   Procedure: IRRIGATION AND DEBRIDEMEN RIGHT TOTAL  KNEE WITH POLY EXCHANGE;  Surgeon: Mauri Pole, MD;  Location: [facility] ORS;  Service: Orthopedics;  Laterality: Right;  . IR GENERIC HISTORICAL  06/17/2016   IR RADIOLOGIST EVAL & MGMT 06/17/2016 Ascencion Dike, PA-C [facility] INTERV RAD  . LAPAROSCOPY N/A 01/15/2016   Procedure: LAPAROSCOPY DIAGNOSTIC;  Surgeon: Stark Klein, MD;  Location: [place];  Service: General;  Laterality: N/A;  . LYSIS OF ADHESION  05/18/2016   Procedure: LYSIS OF ADHESION;  Surgeon: Stark Klein, MD;  Location: [place];  Service: General;;  . NEPHRECTOMY Left    renal cell cancer  . PACEMAKER INSERTION  07/04/2011   MDT PPM implanted by Dr Doreatha Lew for tachy/brady; managed by Dr Thompson Grayer   . PATELLAR TENDON REPAIR Right 03/06/2014   Procedure: AVULSION WITH PATELLA TENDON REPAIR;  Surgeon:  Mauri Pole, MD;  [facility];  Service: Orthopedics;  Laterality: Right;  . PATENT DUCTUS ARTERIOUS REPAIR  ~ 1949  . RIGHT/LEFT HEART CATH AND CORONARY ANGIOGRAPHY N/A 10/09/2019   Procedure: RIGHT/LEFT HEART CATH AND CORONARY ANGIOGRAPHY;  Surgeon: [name], Shadow Schedler M, MD;  Location: [place] CV LAB;  Service: Cardiovascular;  Laterality: N/A;  . TEE WITHOUT CARDIOVERSION N/A 10/30/2019   Procedure: TRANSESOPHAGEAL ECHOCARDIOGRAM (TEE);  Surgeon: Burnell Blanks, MD;  Location: [place];  Service: Open Heart Surgery;  Laterality: N/A;  . TOTAL ABDOMINAL HYSTERECTOMY  85  . TOTAL HIP REVISION Right 2009   initial replacment surg  . TOTAL KNEE ARTHROPLASTY  02/07/2012   Procedure: TOTAL KNEE ARTHROPLASTY;  Surgeon: Mauri Pole, MD;  [facility];  Service: Orthopedics;  Laterality: Right;  . TOTAL KNEE REVISION Right 07/29/2014   Procedure: RIGHT KNEE REVISION OF PREVIOUS REPAIR EXTENSOR MECHANISM;  Surgeon: Mauri Pole, MD;  Location: [facility]S;  Service: Orthopedics;  Laterality: Right;  . TRANSCATHETER AORTIC VALVE REPLACEMENT, CAROTID Left 10/30/2019   TRANSCATHETER AORTIC VALVE REPLACEMENT, LEFT CAROTID (Left Neck)   . TRANSCATHETER AORTIC VALVE REPLACEMENT, CAROTID Left 10/30/2019   Procedure: TRANSCATHETER AORTIC VALVE REPLACEMENT, LEFT CAROTID;  Surgeon: Burnell Blanks, MD;  Location: [place];  Service: Open Heart Surgery;  Laterality: Left;  . TRANSURETHRAL RESECTION OF BLADDER TUMOR N/A 07/21/2018   Procedure: TRANSURETHRAL RESECTION OF BLADDER TUMOR (TURBT);  Surgeon: Alexis Frock, MD;  [facility];  Service: Urology;  Laterality: N/A;  . TRANSURETHRAL RESECTION OF BLADDER TUMOR WITH MITOMYCIN-C N/A 05/01/2018   Procedure: TRANSURETHRAL RESECTION OF BLADDER TUMOR WITH POST OP INSTILLATION OF GEMCITABINE;  Surgeon: Lucas Mallow, MD;  [facility];  Service: Urology;  Laterality: N/A;  . WHIPPLE PROCEDURE N/A 05/18/2016   Procedure: EXPLORATORY  LAPAROTOMY;  Surgeon: Stark Klein, MD;  Location: [facility];  Service: General;  Laterality: N/A;     Current Outpatient Medications  Medication Sig Dispense Refill  . acetaminophen (TYLENOL) 500 MG tablet Take 500-1,000 mg by mouth every 6 (six) hours as needed for mild pain or headache.     . ALPRAZolam (XANAX) 0.5 MG tablet Take 0.5 mg by mouth 2 (two) times daily as needed for anxiety.     . Alum & Mag Hydroxide-Simeth (MYLANTA PO) Take 20 mLs by mouth every evening.     [name] amoxicillin (AMOXIL) 500 MG tablet Take 4 tablets (2,000 mg total) by mouth as directed. 1 hour prior to dental work including  cleanings 12 tablet 12  . Ascorbic Acid (VITAMIN C) 1000 MG tablet Take 1,000 mg by mouth daily.    . Cholecalciferol (VITAMIN D-3) 5000 units TABS Take 5,000 Units by mouth 3 (three) times daily.     . Cyanocobalamin 5000 MCG SUBL Place under the tongue.    [name] erythromycin ophthalmic ointment Place 1 application into both eyes at bedtime as needed (stye).     . famotidine (PEPCID) 40 MG tablet Take 1 tablet Daily for Indigestion & Heartburn 90 tablet 3  . ferrous sulfate 325 (65 FE) MG tablet Take 325 mg by mouth daily in the afternoon.    . furosemide (LASIX) 20 MG tablet Take 1 tablet (20 mg total) by mouth daily. 90 tablet 3  . gabapentin (NEURONTIN) 300 MG capsule Take 300 mg by mouth 2 (two) times daily.    [name] levothyroxine (SYNTHROID) 100 MCG tablet Take 50 mcg by mouth daily before breakfast.    . loperamide (IMODIUM A-D) 2 MG tablet Take up to 12 tablets /day as needed for  for Diarrhea 120 tablet 11  . loratadine (CLARITIN) 10 MG tablet Take 10 mg by mouth at bedtime.     . metoprolol succinate (TOPROL-XL) 50 MG 24 hr tablet Take 1 tablet (50 mg total) by mouth daily for 30 doses. Take with or immediately following a meal. 30 tablet 6  . nitroGLYCERIN (NITROSTAT) 0.4 MG SL tablet Dissolve 1 tablet under tongue every 5 minutes if needed for Angina 50 tablet prn  . ondansetron (ZOFRAN-ODT) 8  MG disintegrating tablet Dissolve 1 tablet under tongue every 6 to 8 hours  3 x /day if needed for Nausea or Vomitting 30 tablet 3  . potassium chloride (KLOR-CON) 10 MEQ tablet Take 1 tablet (10 mEq total) by mouth daily. 90 tablet 3  . Propylene Glycol (SYSTANE COMPLETE) 0.6 % SOLN Place 1 drop into both eyes 3 (three) times daily as needed (irritation/dry eyes.).    [name] simethicone (MYLICON) 80 MG chewable tablet Chew 80 mg by mouth every 6 (six) hours as needed for flatulence.    . sucralfate (CARAFATE) 1 g tablet Take 1 g by mouth 4 (four) times daily.    [name] tobramycin-dexamethasone (TOBRADEX) ophthalmic solution Place 1 drop into both eyes daily as needed (stye).     [name] triamcinolone cream (KENALOG) 0.1 % Apply 1 application topically 2 (two) times daily as needed (rash/skin irritation.).     [name] verapamil (CALAN-SR) 120 MG CR tablet TAKE 1 TABLET 2 X /DAY WITH A MEAL FOR BP & HEART RHYTHM 180 tablet 3  . warfarin (COUMADIN) 2 MG tablet TAKE 1 TO 2 TABLETS AS DIRECTED TO PREVENT BLOOD CLOTS 180 tablet 1   No current facility-administered medications for this visit.    Allergies:   Amiodarone, Adhesive [tape], Hydrocodone, Other, Reglan [metoclopramide], Remeron [mirtazapine], Codeine, Morphine and related, Requip [ropinirole hcl], and Zinc    Social History:  The patient  reports that she has never smoked. She has never used smokeless tobacco. She reports that she does not drink alcohol and does not use drugs.   Family History:  The patient's family history includes Colon cancer (age of onset: 78) in her mother; Heart attack in her father; Heart disease in her father, maternal grandmother, and mother.    ROS:  Please see the history of present illness.   Otherwise, review of systems are positive for none.   All other systems are reviewed and negative.    PHYSICAL EXAM:  VS:  BP (!) 142/50   Pulse 77   Ht 4\' 11"  (1.499 m)   Wt 93 lb 3.2 oz (42.3 kg)   LMP  (LMP Unknown)   SpO2 97%   BMI  18.82 kg/m  , BMI Body mass index is 18.82 kg/m. GEN: Well nourished, elderly WF, in no acute distress. Ambulating with a walker.  HEENT: sclera anicteric Neck: no JVD, carotid bruits, or masses Cardiac: RRR; gr 1/6 diastolic murmur LSB. Respiratory:  clear to auscultation bilaterally, normal work of breathing GI: soft, nontender, nondistended, + BS MS: no deformity or atrophy. Pedal pulse 2+ on the left and absent on the right. Skin: warm and dry, no rash, trace edema in feet.  Neuro:  Strength and sensation are intact Psych: euthymic mood, full affect   EKG:  EKG is not ordered today.    Recent Labs: 03/02/2019: NT-Pro BNP 10,870 10/26/2019: B Natriuretic Peptide 2,138.2 02/12/2020: ALT 22; BUN 22; Creat 1.03; Hemoglobin 12.5; Magnesium 1.9; Platelets 158; Potassium 4.4; Sodium 144; TSH 1.50    Lipid Panel    Component Value Date/Time   CHOL 135 02/12/2020 1414   TRIG 114 02/12/2020 1414   HDL 41 (L) 02/12/2020 1414   CHOLHDL 3.3 02/12/2020 1414   VLDL 29 03/23/2017 1445   LDLCALC 74 02/12/2020 1414      Wt Readings from Last 3 Encounters:  02/15/20 93 lb 3.2 oz (42.3 kg)  02/12/20 95 lb (43.1 kg)  01/16/20 97 lb 3.2 oz (44.1 kg)      Other studies Reviewed: Additional studies/ records that were reviewed today include:   NST 2017:   The left ventricular ejection fraction is normal (55-65%).  Nuclear stress EF: 62%.  The study is normal.  Normal stress nuclear study with no ischemia or infarction; EF 62 with normal wall motion; note patient in atrial flutter at time of study.  Echocardiogram 09/17/19: 1. Left ventricular ejection fraction, by visual estimation, is 60 to 65%. The left ventricle has normal function. There is moderately increased left ventricular hypertrophy.  2. Left ventricular diastolic function could not be evaluated.  3. Small left ventricular internal cavity size.  4. The left ventricle has no regional wall motion abnormalities.  5.  Global right ventricle has normal systolic function.The right ventricular size is normal. No increase in right ventricular wall thickness.  6. Left atrial size was moderately dilated.  7. The mitral valve is degenerative. Mild mitral valve regurgitation.  8. The tricuspid valve is grossly normal.  9. The aortic valve is severely calcified with restricted leaflet motion. Vmax 3.3 m/s, mean gradient 25 mmHG, DI 0.21, AVA 0.60 cm2. SV=43 cc and SVi=31 cc/m2. Suspect there is paradoxical low-flow low-gradient severe aortic stenosis. Moderate aortic  regurgitation is present. Recommend an aortic valve calcium score for clarification of AoV stenosis severity. 10. Aortic valve regurgitation is moderate. 11. The aortic valve is tricuspid. Aortic valve regurgitation is moderate. Moderate to severe aortic valve stenosis. 12. Mildly elevated pulmonary artery systolic pressure. 13. The tricuspid regurgitant velocity is 2.68 m/s, and with an assumed right atrial pressure of 15 mmHg, the estimated right ventricular systolic pressure is mildly elevated at 43.8 mmHg. 14. A pacer wire is visualized in the RA and RV. 15. The inferior vena cava is dilated in size with <50% respiratory variability, suggesting right atrial pressure of 15 mmHg.  TAVR OPERATIVE NOTE   Date of Procedure:10/30/2019  Preoperative Diagnosis:Severe Aortic Stenosis   Postoperative Diagnosis:Same   Procedure:  Transcatheter Aortic Valve Replacement - OpenLeftTranscarotid Approach Edwards Sapien 3 THV (size 39mm, model # [id number], serial # [id number])  Co-Surgeons:Clarence H. Roxy Manns, MD and [name], MD  Anesthesiologist:Chris Ermalene Postin, MD  Dala Dock, MD  Pre-operative Echo Findings: ? Paradoxical Low Flow with Normal EFSevere aortic stenosis ? Normalleft ventricular systolic  function  Post-operative Echo Findings: ? Mildparavalvular leak ? Normalleft ventricular systolic function  _____________  Echo2/24/21: IMPRESSIONS 1. Left ventricular ejection fraction, by estimation, is 70 to 75%. The  left ventricle has hyperdynamic function. The left ventricle has no  regional wall motion abnormalities. There is moderate concentric left  ventricular hypertrophy. Left ventricular  diastolic function could not be evaluated.  2. Right ventricular systolic function is normal. The right ventricular  size is normal. There is moderately elevated pulmonary artery systolic  pressure. The estimated right ventricular systolic pressure is 10.9 mmHg.  3. Left atrial size was mildly dilated.  4. Right atrial size was mildly dilated.  5. Large pleural effusion in the left lateral region.  6. The mitral valve is normal in structure and function. Moderate mitral  valve regurgitation. No evidence of mitral stenosis.  7. There is mild paravalvular leak (at 5'clock on short axis), most  prominent on images 82, 83 where it appears mild to moderate.   Normal peak/mean transaortic gradients 15/7 mmHg.. The aortic valve  has been repaired/replaced. Aortic valve regurgitation is not visualized.  No aortic stenosis is present. Echo findings are consistent with  perivalvular leak of the aortic prosthesis.  Aortic valve mean gradient measures 7.0 mmHg.  8. The inferior vena cava is normal in size with greater than 50%  respiratory variability, suggesting right atrial pressure of 3 mmHg.   ___________________  Limited echo 11/05/19: IMPRESSIONS 1. Left ventricular ejection fraction, by estimation, is 60 to 65%. The  left ventricle has normal function. The left ventricle has no regional  wall motion abnormalities. There is moderate left ventricular hypertrophy.  Left ventricular diastolic  parameters are indeterminate.  2. Moderate pleural effusion in the left  lateral region.  3. The mitral valve is degenerative. Moderate mitral annular  calcifications. Moderate mitral valve regurgitation.  4. Aortic dilatation noted. There is dilatation of the ascending aorta  measuring 40 mm.  5. Tricuspid valve regurgitation is mild to moderate.  6. Right ventricular systolic function is mildly reduced. The right  ventricular size is normal. There is moderately elevated pulmonary artery  systolic pressure. The estimated right ventricular systolic pressure is  32.3 mmHg.  7. The inferior vena cava is normal in size with <50% respiratory  variability, suggesting right atrial pressure of 8 mmHg.  8. There is a 23 mm Edwards Sapien prosthetic (TAVR) valve present in the  aortic position. Procedure Date: 10/30/19. Mean gradient 21mmHg. Echo  findings are consistent with perivalvular leak of the aortic prosthesis.  Paravalvular leak appears  mild-moderate, unchanged from prior TTE on 2/24.   Comparison(s): 10/31/19 EF 70-75%. PA 81mmHg. AV 70mmHg mean, 72mmhg peak. Mild-moderate perivalvular AI.   ___________________  Echo 11/22/19 IMPRESSIONS  1. Left ventricular ejection fraction, by estimation, is 55 to 60%. The  left ventricle has normal function. The left ventricle has no regional  wall motion abnormalities. There is mild concentric left ventricular  hypertrophy. Left ventricular diastolic  parameters are indeterminate. Elevated left ventricular end-diastolic  pressure.  2. Right ventricular systolic function is normal. The right ventricular  size is normal. There is mildly elevated pulmonary artery systolic  pressure.  3. Left  atrial size was severely dilated.  4. Right atrial size was severely dilated.  5. The mitral valve is normal in structure. Mild mitral valve  regurgitation. No evidence of mitral stenosis.  6. Mild to moderate perivalvular aortic regurgitation unchanged from  prior. The aortic valve has been repaired/replaced. Aortic  valve  regurgitation is mild to moderate. No aortic stenosis is present. There is  a 23 mm Edwards Sapien prosthetic (TAVR)  valve present in the aortic position. Procedure Date: 10/30/19. Aortic  regurgitation PHT measures 358 msec. Aortic valve mean gradient measures  10.0 mmHg. Aortic valve Vmax measures 2.11 m/s.  7. Aortic dilatation noted. There is mild dilatation of the ascending  aorta.  8. The inferior vena cava is normal in size with greater than 50%  respiratory variability, suggesting right atrial pressure of 3 mmHg.    ASSESSMENT AND PLAN:   1. Chronic diastolic CHF: secondary to combined Afib, AS. Weight is down to 95 lbs. She has no edema on exam. Clinically she is class 2.  Recommend continuing on lasix 20 mg daily.   She reports improvement of symptoms with TAVR, diuresis and restoration of NSR.    2. Paroxysmal atrial fibrillation:  S/p DCCV. Follows with the coumadin clinic with INR 2.6 this week. - Continue coumadin for stroke ppx - Continue metoprolol  100mg  BID - Continue verapamil at current dose - she has a higher risk for recurrent Afib. Will keep appointment. If AFib recurs will need Dr Jackalyn Lombard input. She has a history of intolerance to amiodarone.   3. Aortic stenosis. S/p TAVR in February:  She has mild to moderate perivalvular lead/AI. Followed by valve clinic.  - on Coumadin due to Afib. No antiplatelet therapy due to needing to be on Coumadin   4. HTN: BP is well controlled.  - Continue losartan, metoprolol,  and verapamil   5. Tachy-brady syndrome s/p PPM: device interrogation with normal function; Follow up in Afib    6. Carotid artery disease: mild stenosis noted on carotid dopplers 08/15/2019 - Recommend annual follow-up doppler studies  7. Thoracic aortic aneurysm. 4.0 cm ectatic aorta. Will follow over time.   8. PAD pre TAVR CT showed severe aortic and ileofemoral atherosclerosis with multifocal stenosis in the right external iliac.  She does have chronic claudication symptoms in her right leg. No critical limb ischemia.  She was initially going to see Dr Gwenlyn Found   but I think it is too soon to pursue this given recent cardiac cath, TAVR, DCCV, CHF, and now rectal bleeding. Intervention for her PAD would require antiplatelet therapy and therefore bleeding risk would be high. She is going to follow up with Dr Carlean Purl.   I will follow up in 4 months.    Disposition:   FU with me in 4 months.   Signed, Marvelous Bouwens [name], MD  02/15/2020 8:59 AM

## 2020-02-15 ENCOUNTER — Ambulatory Visit: Payer: Medicare Other | Admitting: Cardiology

## 2020-02-15 ENCOUNTER — Telehealth: Payer: Self-pay | Admitting: *Deleted

## 2020-02-15 ENCOUNTER — Other Ambulatory Visit: Payer: Self-pay

## 2020-02-15 ENCOUNTER — Encounter: Payer: Self-pay | Admitting: Cardiology

## 2020-02-15 VITALS — BP 142/50 | HR 77 | Ht 59.0 in | Wt 93.2 lb

## 2020-02-15 DIAGNOSIS — I5032 Chronic diastolic (congestive) heart failure: Secondary | ICD-10-CM

## 2020-02-15 DIAGNOSIS — I350 Nonrheumatic aortic (valve) stenosis: Secondary | ICD-10-CM

## 2020-02-15 DIAGNOSIS — I739 Peripheral vascular disease, unspecified: Secondary | ICD-10-CM

## 2020-02-15 DIAGNOSIS — I480 Paroxysmal atrial fibrillation: Secondary | ICD-10-CM | POA: Diagnosis not present

## 2020-02-15 NOTE — Telephone Encounter (Signed)
Patient was seen by Dr [name] today and was wanting to know when she needed to follow up with TAVR clinic  Per Doran Stabler PA will follow up in Feb 2022, they will be in touch Left message to call back

## 2020-02-15 NOTE — Patient Instructions (Signed)
Medication Instructions:  Your physician recommends that you continue on your current medications as directed. Please refer to the Current Medication list given to you today.  *If you need a refill on your cardiac medications before your next appointment, please call your pharmacy*  Lab Work: NONE  Testing/Procedures: NONE  Follow-Up: At [facility], you and your health needs are our priority.  As part of our continuing mission to provide you with exceptional heart care, we have created designated Provider Care Teams.  These Care Teams include your primary Cardiologist (physician) and Advanced Practice Providers (APPs -  Physician Assistants and Nurse Practitioners) who all work together to provide you with the care you need, when you need it.  We recommend signing up for the patient portal called "MyChart".  Sign up information is provided on this After Visit Summary.  MyChart is used to connect with patients for Virtual Visits (Telemedicine).  Patients are able to view lab/test results, encounter notes, upcoming appointments, etc.  Non-urgent messages can be sent to your provider as well.   To learn more about what you can do with MyChart, go to [email or web address].    Your next appointment:   4 month(s)  The format for your next appointment:   In Person  Provider:   You may see [name], MD or one of the following Advanced Practice Providers on your designated Care Team:    Almyra Deforest, PA-C  Fabian Sharp, PA-C or   Roby Lofts, [place]

## 2020-02-16 ENCOUNTER — Other Ambulatory Visit: Payer: Self-pay | Admitting: Internal Medicine

## 2020-02-16 LAB — URINE CULTURE
MICRO NUMBER:: 10577504
SPECIMEN QUALITY:: ADEQUATE

## 2020-02-16 MED ORDER — CIPROFLOXACIN HCL 250 MG PO TABS
ORAL_TABLET | ORAL | 0 refills | Status: DC
Start: 2020-02-16 — End: 2020-03-04

## 2020-02-18 ENCOUNTER — Telehealth: Payer: Self-pay | Admitting: *Deleted

## 2020-02-18 ENCOUNTER — Other Ambulatory Visit: Payer: Self-pay | Admitting: Internal Medicine

## 2020-02-18 NOTE — Telephone Encounter (Signed)
Daughter of the patient called back returning Melinda's call from Friday. Relayed information per Melinda's Phone Note. Daughter was appreciative of the information

## 2020-02-18 NOTE — Telephone Encounter (Signed)
Tabitha returned call, she is seeing pt on Wednesday and will check INR at that time and call result to clinic.

## 2020-02-18 NOTE — Telephone Encounter (Signed)
[name], pt's daughter called coumadin clinic and LMOM.   Called [name] back who stated that pt started taking Cipro 250 mg BID yesterday (02/17/2020)  for a 10 day course. Daughter stated that pt's PCP instructed for pt to take 1/2 a tablet of warfarin (1mg ) while she was on the antibiotic. [name] stated that pt took 1/2 a tablet of warfarin yesterday instead of 1 tablet. Discussed with pharm d and updated daughter. Will have pt continue her normal dose of warfarin while she is on the antibiotic : 1 tablet daily (2mg ), and then will have pt get her INR checked in 3-4 days.   Nolon Stalls, pt's home health nurse at [phone]. LMOM. Calling her to see if she will be able to check pt's INR while she is out at pt's home this week.

## 2020-02-20 ENCOUNTER — Ambulatory Visit (INDEPENDENT_AMBULATORY_CARE_PROVIDER_SITE_OTHER): Payer: Medicare Other | Admitting: *Deleted

## 2020-02-20 DIAGNOSIS — E559 Vitamin D deficiency, unspecified: Secondary | ICD-10-CM | POA: Diagnosis not present

## 2020-02-20 DIAGNOSIS — J301 Allergic rhinitis due to pollen: Secondary | ICD-10-CM | POA: Diagnosis not present

## 2020-02-20 DIAGNOSIS — Z950 Presence of cardiac pacemaker: Secondary | ICD-10-CM | POA: Diagnosis not present

## 2020-02-20 DIAGNOSIS — Z952 Presence of prosthetic heart valve: Secondary | ICD-10-CM | POA: Diagnosis not present

## 2020-02-20 DIAGNOSIS — I495 Sick sinus syndrome: Secondary | ICD-10-CM | POA: Diagnosis not present

## 2020-02-20 DIAGNOSIS — I2510 Atherosclerotic heart disease of native coronary artery without angina pectoris: Secondary | ICD-10-CM | POA: Diagnosis not present

## 2020-02-20 DIAGNOSIS — I272 Pulmonary hypertension, unspecified: Secondary | ICD-10-CM | POA: Diagnosis not present

## 2020-02-20 DIAGNOSIS — I480 Paroxysmal atrial fibrillation: Secondary | ICD-10-CM | POA: Diagnosis not present

## 2020-02-20 DIAGNOSIS — E875 Hyperkalemia: Secondary | ICD-10-CM | POA: Diagnosis not present

## 2020-02-20 DIAGNOSIS — H5462 Unqualified visual loss, left eye, normal vision right eye: Secondary | ICD-10-CM | POA: Diagnosis not present

## 2020-02-20 DIAGNOSIS — K219 Gastro-esophageal reflux disease without esophagitis: Secondary | ICD-10-CM | POA: Diagnosis not present

## 2020-02-20 DIAGNOSIS — Z7901 Long term (current) use of anticoagulants: Secondary | ICD-10-CM | POA: Diagnosis not present

## 2020-02-20 DIAGNOSIS — Z85038 Personal history of other malignant neoplasm of large intestine: Secondary | ICD-10-CM | POA: Diagnosis not present

## 2020-02-20 DIAGNOSIS — I5033 Acute on chronic diastolic (congestive) heart failure: Secondary | ICD-10-CM | POA: Diagnosis not present

## 2020-02-20 DIAGNOSIS — I081 Rheumatic disorders of both mitral and tricuspid valves: Secondary | ICD-10-CM | POA: Diagnosis not present

## 2020-02-20 DIAGNOSIS — Z48812 Encounter for surgical aftercare following surgery on the circulatory system: Secondary | ICD-10-CM | POA: Diagnosis not present

## 2020-02-20 DIAGNOSIS — G2581 Restless legs syndrome: Secondary | ICD-10-CM | POA: Diagnosis not present

## 2020-02-20 DIAGNOSIS — I130 Hypertensive heart and chronic kidney disease with heart failure and stage 1 through stage 4 chronic kidney disease, or unspecified chronic kidney disease: Secondary | ICD-10-CM | POA: Diagnosis not present

## 2020-02-20 DIAGNOSIS — E039 Hypothyroidism, unspecified: Secondary | ICD-10-CM | POA: Diagnosis not present

## 2020-02-20 DIAGNOSIS — K3184 Gastroparesis: Secondary | ICD-10-CM | POA: Diagnosis not present

## 2020-02-20 DIAGNOSIS — Z5181 Encounter for therapeutic drug level monitoring: Secondary | ICD-10-CM | POA: Diagnosis not present

## 2020-02-20 DIAGNOSIS — I4891 Unspecified atrial fibrillation: Secondary | ICD-10-CM

## 2020-02-20 DIAGNOSIS — H6523 Chronic serous otitis media, bilateral: Secondary | ICD-10-CM | POA: Diagnosis not present

## 2020-02-20 DIAGNOSIS — G5791 Unspecified mononeuropathy of right lower limb: Secondary | ICD-10-CM | POA: Diagnosis not present

## 2020-02-20 DIAGNOSIS — N183 Chronic kidney disease, stage 3 unspecified: Secondary | ICD-10-CM | POA: Diagnosis not present

## 2020-02-20 LAB — POCT INR: INR: 1.9 — AB (ref 2.0–3.0)

## 2020-02-20 NOTE — Patient Instructions (Addendum)
Description   Spoke with Tabitha [facility] RN while in pt's home, advised to have pt to take an (extra half tablet) 3mg  today the continue on same dosage 2mg  daily. Recheck INR in 1 week by  [facility] RN. Call us with any medication changes or concerns # [phone]. [place]'s # [phone]

## 2020-02-25 ENCOUNTER — Ambulatory Visit (INDEPENDENT_AMBULATORY_CARE_PROVIDER_SITE_OTHER): Payer: Medicare Other

## 2020-02-25 DIAGNOSIS — Z5181 Encounter for therapeutic drug level monitoring: Secondary | ICD-10-CM | POA: Diagnosis not present

## 2020-02-25 DIAGNOSIS — J301 Allergic rhinitis due to pollen: Secondary | ICD-10-CM | POA: Diagnosis not present

## 2020-02-25 DIAGNOSIS — I480 Paroxysmal atrial fibrillation: Secondary | ICD-10-CM | POA: Diagnosis not present

## 2020-02-25 DIAGNOSIS — H6523 Chronic serous otitis media, bilateral: Secondary | ICD-10-CM | POA: Diagnosis not present

## 2020-02-25 DIAGNOSIS — Z48812 Encounter for surgical aftercare following surgery on the circulatory system: Secondary | ICD-10-CM | POA: Diagnosis not present

## 2020-02-25 DIAGNOSIS — Z7901 Long term (current) use of anticoagulants: Secondary | ICD-10-CM | POA: Diagnosis not present

## 2020-02-25 DIAGNOSIS — G5791 Unspecified mononeuropathy of right lower limb: Secondary | ICD-10-CM | POA: Diagnosis not present

## 2020-02-25 DIAGNOSIS — E039 Hypothyroidism, unspecified: Secondary | ICD-10-CM | POA: Diagnosis not present

## 2020-02-25 DIAGNOSIS — Z952 Presence of prosthetic heart valve: Secondary | ICD-10-CM | POA: Diagnosis not present

## 2020-02-25 DIAGNOSIS — E559 Vitamin D deficiency, unspecified: Secondary | ICD-10-CM | POA: Diagnosis not present

## 2020-02-25 DIAGNOSIS — K3184 Gastroparesis: Secondary | ICD-10-CM | POA: Diagnosis not present

## 2020-02-25 DIAGNOSIS — I495 Sick sinus syndrome: Secondary | ICD-10-CM | POA: Diagnosis not present

## 2020-02-25 DIAGNOSIS — I081 Rheumatic disorders of both mitral and tricuspid valves: Secondary | ICD-10-CM | POA: Diagnosis not present

## 2020-02-25 DIAGNOSIS — I4891 Unspecified atrial fibrillation: Secondary | ICD-10-CM | POA: Diagnosis not present

## 2020-02-25 DIAGNOSIS — Z950 Presence of cardiac pacemaker: Secondary | ICD-10-CM | POA: Diagnosis not present

## 2020-02-25 DIAGNOSIS — H5462 Unqualified visual loss, left eye, normal vision right eye: Secondary | ICD-10-CM | POA: Diagnosis not present

## 2020-02-25 DIAGNOSIS — E875 Hyperkalemia: Secondary | ICD-10-CM | POA: Diagnosis not present

## 2020-02-25 DIAGNOSIS — Z85038 Personal history of other malignant neoplasm of large intestine: Secondary | ICD-10-CM | POA: Diagnosis not present

## 2020-02-25 DIAGNOSIS — I130 Hypertensive heart and chronic kidney disease with heart failure and stage 1 through stage 4 chronic kidney disease, or unspecified chronic kidney disease: Secondary | ICD-10-CM | POA: Diagnosis not present

## 2020-02-25 DIAGNOSIS — N183 Chronic kidney disease, stage 3 unspecified: Secondary | ICD-10-CM | POA: Diagnosis not present

## 2020-02-25 DIAGNOSIS — I5033 Acute on chronic diastolic (congestive) heart failure: Secondary | ICD-10-CM | POA: Diagnosis not present

## 2020-02-25 DIAGNOSIS — K219 Gastro-esophageal reflux disease without esophagitis: Secondary | ICD-10-CM | POA: Diagnosis not present

## 2020-02-25 DIAGNOSIS — G2581 Restless legs syndrome: Secondary | ICD-10-CM | POA: Diagnosis not present

## 2020-02-25 DIAGNOSIS — I2510 Atherosclerotic heart disease of native coronary artery without angina pectoris: Secondary | ICD-10-CM | POA: Diagnosis not present

## 2020-02-25 DIAGNOSIS — I272 Pulmonary hypertension, unspecified: Secondary | ICD-10-CM | POA: Diagnosis not present

## 2020-02-25 LAB — POCT INR: INR: 2.1 (ref 2.0–3.0)

## 2020-02-25 NOTE — Patient Instructions (Signed)
Description   Spoke with [facility]a [facility] RN while in pt's home, advised to have pt continue on same dosage 2mg  daily. Recheck INR in 2 weeks by [facility] RN. Call us with any medication changes or concerns # [phone]. [place]'s # [phone]

## 2020-02-27 DIAGNOSIS — I081 Rheumatic disorders of both mitral and tricuspid valves: Secondary | ICD-10-CM | POA: Diagnosis not present

## 2020-02-27 DIAGNOSIS — Z85038 Personal history of other malignant neoplasm of large intestine: Secondary | ICD-10-CM | POA: Diagnosis not present

## 2020-02-27 DIAGNOSIS — H5462 Unqualified visual loss, left eye, normal vision right eye: Secondary | ICD-10-CM | POA: Diagnosis not present

## 2020-02-27 DIAGNOSIS — H6523 Chronic serous otitis media, bilateral: Secondary | ICD-10-CM | POA: Diagnosis not present

## 2020-02-27 DIAGNOSIS — I495 Sick sinus syndrome: Secondary | ICD-10-CM | POA: Diagnosis not present

## 2020-02-27 DIAGNOSIS — Z952 Presence of prosthetic heart valve: Secondary | ICD-10-CM | POA: Diagnosis not present

## 2020-02-27 DIAGNOSIS — Z950 Presence of cardiac pacemaker: Secondary | ICD-10-CM | POA: Diagnosis not present

## 2020-02-27 DIAGNOSIS — E875 Hyperkalemia: Secondary | ICD-10-CM | POA: Diagnosis not present

## 2020-02-27 DIAGNOSIS — K219 Gastro-esophageal reflux disease without esophagitis: Secondary | ICD-10-CM | POA: Diagnosis not present

## 2020-02-27 DIAGNOSIS — N183 Chronic kidney disease, stage 3 unspecified: Secondary | ICD-10-CM | POA: Diagnosis not present

## 2020-02-27 DIAGNOSIS — I480 Paroxysmal atrial fibrillation: Secondary | ICD-10-CM | POA: Diagnosis not present

## 2020-02-27 DIAGNOSIS — G2581 Restless legs syndrome: Secondary | ICD-10-CM | POA: Diagnosis not present

## 2020-02-27 DIAGNOSIS — I130 Hypertensive heart and chronic kidney disease with heart failure and stage 1 through stage 4 chronic kidney disease, or unspecified chronic kidney disease: Secondary | ICD-10-CM | POA: Diagnosis not present

## 2020-02-27 DIAGNOSIS — I2510 Atherosclerotic heart disease of native coronary artery without angina pectoris: Secondary | ICD-10-CM | POA: Diagnosis not present

## 2020-02-27 DIAGNOSIS — I272 Pulmonary hypertension, unspecified: Secondary | ICD-10-CM | POA: Diagnosis not present

## 2020-02-27 DIAGNOSIS — E039 Hypothyroidism, unspecified: Secondary | ICD-10-CM | POA: Diagnosis not present

## 2020-02-27 DIAGNOSIS — G5791 Unspecified mononeuropathy of right lower limb: Secondary | ICD-10-CM | POA: Diagnosis not present

## 2020-02-27 DIAGNOSIS — K3184 Gastroparesis: Secondary | ICD-10-CM | POA: Diagnosis not present

## 2020-02-27 DIAGNOSIS — I5033 Acute on chronic diastolic (congestive) heart failure: Secondary | ICD-10-CM | POA: Diagnosis not present

## 2020-02-27 DIAGNOSIS — Z48812 Encounter for surgical aftercare following surgery on the circulatory system: Secondary | ICD-10-CM | POA: Diagnosis not present

## 2020-02-27 DIAGNOSIS — E559 Vitamin D deficiency, unspecified: Secondary | ICD-10-CM | POA: Diagnosis not present

## 2020-02-27 DIAGNOSIS — Z5181 Encounter for therapeutic drug level monitoring: Secondary | ICD-10-CM | POA: Diagnosis not present

## 2020-02-27 DIAGNOSIS — J301 Allergic rhinitis due to pollen: Secondary | ICD-10-CM | POA: Diagnosis not present

## 2020-02-27 DIAGNOSIS — Z7901 Long term (current) use of anticoagulants: Secondary | ICD-10-CM | POA: Diagnosis not present

## 2020-02-28 ENCOUNTER — Ambulatory Visit (INDEPENDENT_AMBULATORY_CARE_PROVIDER_SITE_OTHER): Payer: Medicare Other | Admitting: *Deleted

## 2020-02-28 DIAGNOSIS — C672 Malignant neoplasm of lateral wall of bladder: Secondary | ICD-10-CM | POA: Diagnosis not present

## 2020-02-28 DIAGNOSIS — Z950 Presence of cardiac pacemaker: Secondary | ICD-10-CM | POA: Diagnosis not present

## 2020-02-28 LAB — CUP PACEART REMOTE DEVICE CHECK
Battery Impedance: 2482 Ohm
Battery Remaining Longevity: 28 mo
Battery Voltage: 2.76 V
Brady Statistic AP VP Percent: 0 %
Brady Statistic AP VS Percent: 36 %
Brady Statistic AS VP Percent: 0 %
Brady Statistic AS VS Percent: 63 %
Date Time Interrogation Session: 20210624123532
Implantable Lead Implant Date: 20111027
Implantable Lead Implant Date: 20111027
Implantable Lead Location: 753859
Implantable Lead Location: 753860
Implantable Lead Model: 4469
Implantable Lead Model: 4470
Implantable Lead Serial Number: 548229
Implantable Lead Serial Number: 686026
Implantable Pulse Generator Implant Date: 20111027
Lead Channel Impedance Value: 410 Ohm
Lead Channel Impedance Value: 480 Ohm
Lead Channel Pacing Threshold Amplitude: 0.375 V
Lead Channel Pacing Threshold Amplitude: 1.625 V
Lead Channel Pacing Threshold Pulse Width: 0.4 ms
Lead Channel Pacing Threshold Pulse Width: 0.4 ms
Lead Channel Setting Pacing Amplitude: 2 V
Lead Channel Setting Pacing Amplitude: 3.25 V
Lead Channel Setting Pacing Pulse Width: 0.4 ms
Lead Channel Setting Sensing Sensitivity: 2 mV

## 2020-02-29 NOTE — Progress Notes (Signed)
Remote pacemaker transmission.   

## 2020-03-03 NOTE — Progress Notes (Signed)
History of Present Illness:     This delightful 86 y.o. WWF with  multiple medical co-morbidities HTN, pAfib / Sick Sinus Syndrome, hx/o Severe AoS, HLD, Prediabetes  and Vitamin D Deficiency. Also here to f/u recent treatment of UTI.  Presents with c/o intermittent rectal bleeding several times over the last 3 weeks from [facility] / hematochezia to bloody mucus discharge  (picture by daughter [name]). Patient is c/o alternating constipation and diarrheal stools. Also c/o mid abdominal pains nt associated with meals or evacuation. No N/V. 3 weeks ago on 6/8 21, patient's CBC showed Hgb 12.5 gm%  Patient had a negative Colon in 2017 by Dr Carlean Purl and called his office & was given an appt to see his associate Janett Billow in 2 weeks.   Concern for GI neoplasm given patient's hx/o  4 prior Cancers - Uterine Ca (1994),Transitional CellKidney Ca (2003), Gastric Ca (2017)and Bladder Ca (2019).  Medications  .  levothyroxine  100 MCG tablet, TAKE 1/2 TABLET DAILY. .  furosemide  20 MG tablet, Take 1 tabletdaily. .  metoprolol succinate -XL 50 MG , Take 1 tablet  .  NITROSTAT 0.4 MG SL-if needed for Angina .  verapamil 120 MG CR tablet, TAKE 1 TABLET 2 X /DAY  .  loratadine 10 MG tablet, Take at bedtime.  [name]  acetaminophen  500 MG t, Take 500-1,000 mg by mouth every 6  hours as needed .  Cyanocobalamin 5000 MCG SUBL,  under  tongue. .  ferrous sulfate 325 (65 FE) MG , Take daily  .  warfarin (COUMADIN) 2 MG tablet, TAKE 1 TO 2 TABLETS AS DIRECTED  .  ALPRAZolam  0.5 MG tablet, Take  2  times daily as needed .  [name]  MYLANTA , Take 20 mLs every evening.  [name]  amoxicillin 500 MG tablet, Take 4 tablets 1 hour prior to dental work . VITAMIN C 1000 MG tablet, Take  daily. [name]  VITAMIN D 5000 units TABS, Take 5,000 Units  3  times daily.  [name]  erythromycin ophth oint, Place 1 application into both eyes at bedtime as needed (stye).  .  famotidine  40 MG tablet, Take 1 tablet Daily .  gabapentin  300 MG  capsule, Take  2  times daily. [name]  loperamide A-D 2 MG tablet, Take up to 12 tablets /day as needed for  for Diarrhea .  ondansetron-ODT 8 MG , 1 tablet  every 6 to 8 hours  3 x /day if needed for Nausea or Vomitting .  KLOR-CON 10 MEQ , Take 1 tablet (10 mEq total) by mouth daily. [name]  Propylene Glycol (SYSTANE) 0.6 % SOLN, into both eyes 3  times daily as needed  . MYLICON 80 MG chewable  every 6  hours as needed for flatulence. .  sucralfate  1 g tablet, Take 1 g  4 (four) times daily. [name]  tobramycin-dexamethasone (TOBRADEX) ophth soln, both eyes daily as needed (stye).  [name]  triamcinolone crm  0.1 %, Apply 1 application topically 2  times daily as needed    Problem list She has Malignant neoplasm of kidney excluding renal pelvis ([place]); Hypothyroidism; Atrial fibrillation ([place]) Chadsvasc 5; Tachycardia-bradycardia syndrome ([place]); S/P repair of patent ductus arteriosus; Severe aortic stenosis; Cardiac pacemaker ; Hyperlipidemia, mixed; Vitamin D deficiency; Essential hypertension; Vitamin B12 deficiency; CKD (chronic kidney disease), stage III; Adenocarcinoma of stomach ([place]); Refusal of blood transfusions as patient is Jehovah's Witness; Iron deficiency anemia due to chronic blood loss; Gastroesophageal  reflux disease with esophagitis; Post gastrectomy syndrome; S/P partial gastrectomy; Long term current use of anticoagulant therapy; History of stroke; Malignant tumor of urinary bladder ([place]); Acute on chronic diastolic heart failure (HCC); and S/P TAVR (transcatheter aortic valve replacement) on their problem list.   Observations/Objective:  BP 118/66   Pulse 72   Temp (!) 97.4 F (36.3 C)   Resp 16   Ht 4\' 11"  (1.499 m)   Wt 95 lb 3.2 oz (43.2 kg)   LMP  (LMP Unknown)   BMI 19.23 kg/m   HEENT - WNL. Neck - supple.  Chest - Clear equal BS. Cor - Nl HS. RRR w/o sig MGR. PP 1(+). No edema. Abd - soft . Sl distended with generalized tenderness.  DRE - Stool light brown & strongly HEMOCCULT  POSITIVE. MS- FROM w/o deformities. Generalized wasting with severe loss of muscle mass.  Gait supported Gilford Rile. Neuro -  Nl w/o focal abnormalities.  Assessment and Plan:  1. Rectal bleeding  _ GI Referral to Dr Carlean Purl   - CBC with Differential/Platelet  2. Dysuria  - Urine Culture - Urinalysis, Routine w reflex microscopic  Follow Up Instructions:     I discussed the assessment and treatment plan with the patient & Daughter - [name]. The patient & daughter were provided an opportunity to ask questions and all were answered. They  agreed with the plan and demonstrated an understanding of the instructions.      They were advised to call back or seek an in-person evaluation if the symptoms worsen or if the condition fails to improve as anticipated.  Kirtland Bouchard, MD

## 2020-03-04 ENCOUNTER — Encounter: Payer: Self-pay | Admitting: Internal Medicine

## 2020-03-04 ENCOUNTER — Ambulatory Visit (INDEPENDENT_AMBULATORY_CARE_PROVIDER_SITE_OTHER): Payer: Medicare Other | Admitting: Internal Medicine

## 2020-03-04 ENCOUNTER — Other Ambulatory Visit: Payer: Self-pay

## 2020-03-04 VITALS — BP 118/66 | HR 72 | Temp 97.4°F | Resp 16 | Ht 59.0 in | Wt 95.2 lb

## 2020-03-04 DIAGNOSIS — K625 Hemorrhage of anus and rectum: Secondary | ICD-10-CM | POA: Diagnosis not present

## 2020-03-04 DIAGNOSIS — R300 Dysuria: Secondary | ICD-10-CM | POA: Diagnosis not present

## 2020-03-04 LAB — CBC WITH DIFFERENTIAL/PLATELET
Absolute Monocytes: 502 cells/uL (ref 200–950)
Basophils Absolute: 33 cells/uL (ref 0–200)
Basophils Relative: 0.5 %
Eosinophils Absolute: 191 cells/uL (ref 15–500)
Eosinophils Relative: 2.9 %
HCT: 37.4 % (ref 35.0–45.0)
Hemoglobin: 12.5 g/dL (ref 11.7–15.5)
Lymphs Abs: 1360 cells/uL (ref 850–3900)
MCH: 33.4 pg — ABNORMAL HIGH (ref 27.0–33.0)
MCHC: 33.4 g/dL (ref 32.0–36.0)
MCV: 100 fL (ref 80.0–100.0)
MPV: 10.2 fL (ref 7.5–12.5)
Monocytes Relative: 7.6 %
Neutro Abs: 4514 cells/uL (ref 1500–7800)
Neutrophils Relative %: 68.4 %
Platelets: 175 10*3/uL (ref 140–400)
RBC: 3.74 10*6/uL — ABNORMAL LOW (ref 3.80–5.10)
RDW: 12 % (ref 11.0–15.0)
Total Lymphocyte: 20.6 %
WBC: 6.6 10*3/uL (ref 3.8–10.8)

## 2020-03-04 NOTE — Patient Instructions (Signed)
Rectal Bleeding  Rectal bleeding is when blood passes out of the anus. People with rectal bleeding may notice bright red blood in their underwear or in the toilet after having a bowel movement. They may also have dark red or black stools. Rectal bleeding is usually a sign that something is wrong. Many things can cause rectal bleeding, including:  Hemorrhoids. These are blood vessels in the anus or rectum that are larger than normal.  Fistulas. These are abnormal passages in the rectum and anus.  Anal fissures. This is a tear in the anus.  Diverticulosis. This is a condition in which pockets or sacs project from the bowel.  Proctitis and colitis. These are conditions in which the rectum, colon, or anus become inflamed.  Polyps. These are growths that can be cancerous (malignant) or non-cancerous (benign).  Part of the rectum sticking out from the anus (rectal prolapse).  Certain medicines.  Intestinal infections. Follow these instructions at home: Pay attention to any changes in your symptoms. Take these actions to help lessen bleeding and discomfort:  Eat a diet that is high in fiber. This will keep your stool soft, making it easier to pass stools without straining. Ask your health care provider what foods and drinks are high in fiber.  Drink enough fluid to keep your urine clear or pale yellow. This also helps to keep your stool soft.  Try taking a warm bath. This may help soothe any pain in your rectum.  Keep all follow-up visits as told by your health care provider. This is important. Get help right away if:  You have new or increased rectal bleeding.  You have black or dark red stools.  You vomit blood or something that looks like coffee grounds.  You have pain or tenderness in your abdomen.  You have a fever.  You feel weak.  You feel nauseous.  You faint.  You have severe pain in your rectum.  You cannot have a bowel movement.

## 2020-03-06 ENCOUNTER — Other Ambulatory Visit: Payer: Self-pay | Admitting: Internal Medicine

## 2020-03-06 LAB — URINALYSIS, ROUTINE W REFLEX MICROSCOPIC
Bacteria, UA: NONE SEEN /HPF
Bilirubin Urine: NEGATIVE
Glucose, UA: NEGATIVE
Hgb urine dipstick: NEGATIVE
Hyaline Cast: NONE SEEN /LPF
Ketones, ur: NEGATIVE
Nitrite: NEGATIVE
Protein, ur: NEGATIVE
Specific Gravity, Urine: 1.014 (ref 1.001–1.03)
pH: <=5 (ref 5.0–8.0)

## 2020-03-06 LAB — URINE CULTURE
MICRO NUMBER:: 10648431
SPECIMEN QUALITY:: ADEQUATE

## 2020-03-06 MED ORDER — NITROFURANTOIN MONOHYD MACRO 100 MG PO CAPS: ORAL_CAPSULE | ORAL | 0 refills | Status: DC

## 2020-03-06 NOTE — Progress Notes (Signed)
=========================================================  -    U/C is (+) with a different bacteria, So..................  - [facility]bid [facility] =========================================================  -  Need [place] to recheck Urine in 1 month =========================================================  -  CBC is Normal / OK  =========================================================

## 2020-03-11 ENCOUNTER — Telehealth: Payer: Self-pay

## 2020-03-11 ENCOUNTER — Ambulatory Visit (INDEPENDENT_AMBULATORY_CARE_PROVIDER_SITE_OTHER): Payer: Medicare Other | Admitting: Cardiovascular Disease

## 2020-03-11 ENCOUNTER — Ambulatory Visit: Payer: Medicare Other | Admitting: Internal Medicine

## 2020-03-11 ENCOUNTER — Encounter: Payer: Self-pay | Admitting: Internal Medicine

## 2020-03-11 ENCOUNTER — Telehealth: Payer: Self-pay | Admitting: *Deleted

## 2020-03-11 VITALS — BP 142/70 | HR 90 | Ht 59.0 in | Wt 96.0 lb

## 2020-03-11 DIAGNOSIS — Z7901 Long term (current) use of anticoagulants: Secondary | ICD-10-CM

## 2020-03-11 DIAGNOSIS — R1013 Epigastric pain: Secondary | ICD-10-CM

## 2020-03-11 DIAGNOSIS — I130 Hypertensive heart and chronic kidney disease with heart failure and stage 1 through stage 4 chronic kidney disease, or unspecified chronic kidney disease: Secondary | ICD-10-CM | POA: Diagnosis not present

## 2020-03-11 DIAGNOSIS — E559 Vitamin D deficiency, unspecified: Secondary | ICD-10-CM | POA: Diagnosis not present

## 2020-03-11 DIAGNOSIS — H6523 Chronic serous otitis media, bilateral: Secondary | ICD-10-CM | POA: Diagnosis not present

## 2020-03-11 DIAGNOSIS — K625 Hemorrhage of anus and rectum: Secondary | ICD-10-CM

## 2020-03-11 DIAGNOSIS — I700 Atherosclerosis of aorta: Secondary | ICD-10-CM

## 2020-03-11 DIAGNOSIS — Z85028 Personal history of other malignant neoplasm of stomach: Secondary | ICD-10-CM

## 2020-03-11 DIAGNOSIS — N183 Chronic kidney disease, stage 3 unspecified: Secondary | ICD-10-CM | POA: Diagnosis not present

## 2020-03-11 DIAGNOSIS — G5791 Unspecified mononeuropathy of right lower limb: Secondary | ICD-10-CM | POA: Diagnosis not present

## 2020-03-11 DIAGNOSIS — K219 Gastro-esophageal reflux disease without esophagitis: Secondary | ICD-10-CM | POA: Diagnosis not present

## 2020-03-11 DIAGNOSIS — I480 Paroxysmal atrial fibrillation: Secondary | ICD-10-CM | POA: Diagnosis not present

## 2020-03-11 DIAGNOSIS — I2510 Atherosclerotic heart disease of native coronary artery without angina pectoris: Secondary | ICD-10-CM | POA: Diagnosis not present

## 2020-03-11 DIAGNOSIS — I495 Sick sinus syndrome: Secondary | ICD-10-CM | POA: Diagnosis not present

## 2020-03-11 DIAGNOSIS — Z5181 Encounter for therapeutic drug level monitoring: Secondary | ICD-10-CM | POA: Diagnosis not present

## 2020-03-11 DIAGNOSIS — R1031 Right lower quadrant pain: Secondary | ICD-10-CM

## 2020-03-11 DIAGNOSIS — E875 Hyperkalemia: Secondary | ICD-10-CM | POA: Diagnosis not present

## 2020-03-11 DIAGNOSIS — J301 Allergic rhinitis due to pollen: Secondary | ICD-10-CM | POA: Diagnosis not present

## 2020-03-11 DIAGNOSIS — E039 Hypothyroidism, unspecified: Secondary | ICD-10-CM | POA: Diagnosis not present

## 2020-03-11 DIAGNOSIS — H5462 Unqualified visual loss, left eye, normal vision right eye: Secondary | ICD-10-CM | POA: Diagnosis not present

## 2020-03-11 DIAGNOSIS — Z952 Presence of prosthetic heart valve: Secondary | ICD-10-CM | POA: Diagnosis not present

## 2020-03-11 DIAGNOSIS — I272 Pulmonary hypertension, unspecified: Secondary | ICD-10-CM | POA: Diagnosis not present

## 2020-03-11 DIAGNOSIS — Z85038 Personal history of other malignant neoplasm of large intestine: Secondary | ICD-10-CM | POA: Diagnosis not present

## 2020-03-11 DIAGNOSIS — I081 Rheumatic disorders of both mitral and tricuspid valves: Secondary | ICD-10-CM | POA: Diagnosis not present

## 2020-03-11 DIAGNOSIS — I5033 Acute on chronic diastolic (congestive) heart failure: Secondary | ICD-10-CM | POA: Diagnosis not present

## 2020-03-11 DIAGNOSIS — K3184 Gastroparesis: Secondary | ICD-10-CM | POA: Diagnosis not present

## 2020-03-11 DIAGNOSIS — I4811 Longstanding persistent atrial fibrillation: Secondary | ICD-10-CM

## 2020-03-11 DIAGNOSIS — G2581 Restless legs syndrome: Secondary | ICD-10-CM | POA: Diagnosis not present

## 2020-03-11 DIAGNOSIS — Z950 Presence of cardiac pacemaker: Secondary | ICD-10-CM | POA: Diagnosis not present

## 2020-03-11 LAB — POCT INR: INR: 4.3 — AB (ref 2.0–3.0)

## 2020-03-11 NOTE — Telephone Encounter (Signed)
[facility] Pre-operative Risk Assessment     Request for surgical clearance:     Endoscopy Procedure  What type of surgery is being performed?     EGD and Colonoscopy  When is this surgery scheduled?     04/11/2020  What type of clearance is required ?   Pharmacy  Are there any medications that need to be held prior to surgery and how long? Warfarin x 5 days  Practice name and name of physician performing surgery?      [facility]  What is your office phone and fax number?      Phone- [phone]  [phone]  Anesthesia type (None, local, MAC, general) ?       MAC

## 2020-03-11 NOTE — Patient Instructions (Signed)
You have been scheduled for an endoscopy and colonoscopy. Please follow the written instructions given to you at your visit today. Please pick up your prep supplies at the pharmacy within the next 1-3 days. If you use inhalers (even only as needed), please bring them with you on the day of your procedure.   You will be contaced by our office prior to your procedure for directions on holding your Coumadin/Warfarin.  If you do not hear from our office 1 week prior to your scheduled procedure, please call [phone] to discuss.   I appreciate the opportunity to care for you. Silvano Rusk, MD, [facility]

## 2020-03-11 NOTE — Telephone Encounter (Addendum)
Pt's daughter called and stated that pt was started on Macrobid/nitrofurantion on 7/1 pt should be done with medication by 7/8. Informed her that it should not interfere with her warfarin. Pt is suppose to be getting her INR checked today by home health.

## 2020-03-11 NOTE — Patient Instructions (Signed)
Spoke with [facility] RN while in pt's home, advised to have pt hold Coumadin today and tomorrow, then continue on same dosage 2mg  daily. Recheck INR in 1.5 weeks by [facility] RN. Call us with any medication changes or concerns # [phone]. [place]'s # [phone]

## 2020-03-11 NOTE — Progress Notes (Signed)
[name] 86 y.o. 03-08-1934 [id number]  Assessment & Plan:   Encounter Diagnoses  Name Primary?  . Rectal bleeding Yes  . RLQ abdominal pain   . Abdominal pain, epigastric   . Abdominal aortic atherosclerosis ([place])   . History of gastric cancer   . Warfarin anticoagulation    Evaluate these issues with an EGD and a colonoscopy. Hold warfarin 5 days prior check with cardiology on this. Extra but real risk of stroke off warfarin therapy in the setting of atrial fibrillation explained and the patient and her daughter understand. Other routine risks of EGD and colonoscopy are explained and the patient has reviewed our informed consent form.  She has had radiation in past and could have radiation colitis or enteritis, doubt gastric cancer recurrence but reasonable to check gastric area especially since she is coming off warfarin. Abdominal aortic atherosclerosis increases chances of ischemic damage though no mention of celiac, SMA or IMA stenoses and I do not see any on my review of the images.   I appreciate the opportunity to care for this patient.   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  INR high today as was on cranberry juice and AZO for recurrent UTI issues -   However has been bleeing x weeks  She has problems of regurgitation after eating - not as severe as in past immediately post-op but describes getting an epigastric pressure/pain and sometimes spitting up food after.  Chronic borborygmi and gas sxs.   Wt Readings from Last 3 Encounters:  03/11/20 96 lb (43.5 kg)  03/04/20 95 lb 3.2 oz (43.2 kg)  02/15/20 93 lb 3.2 oz (42.3 kg)     Lab Results  Component Value Date   WBC 6.6 03/04/2020   HGB 12.5 03/04/2020   HCT 37.4 03/04/2020   MCV 100.0  03/04/2020   PLT 175 03/04/2020    CT angio 10/26/19 pre-TAVR  IMPRESSION: 1. Vascular findings and measurements pertinent to potential TAVR procedure, as detailed. Note made of severe aortic and iliofemoral atherosclerosis with multifocal high-grade stenoses in the right external iliac artery. 2. Diffuse thickening and coarse calcification of the aortic valve, compatible with the provided history of severe symptomatic aortic stenosis. 3. Small dependent bilateral pleural effusions. 4. Mild cardiomegaly. 5. Stable ectatic 4.0 cm ascending thoracic aorta. Recommend annual imaging followup by CTA or MRA. This recommendation follows 2010 ACCF/AHA/AATS/ACR/ASA/SCA/SCAI/SIR/STS/SVM Guidelines for the Diagnosis and Management of Patients with Thoracic Aortic Disease. Circulation. 2010; 121: X833-A250. Aortic aneurysm NOS (ICD10-I71.9). 6. Stable extensive postsurgical changes from prior cholecystectomy, hysterectomy, distal gastrectomy and left nephrectomy. 7. Aortic Atherosclerosis (ICD10-I70.0). Allergies  Allergen Reactions  . Amiodarone Other (See Comments)     Thyroid and liver and kidney problems  . Adhesive [Tape] Other (See Comments)    Tears skin, Please use "paper" tape  . Hydrocodone Other (See Comments)    HALLLUCINATIONS  . Other Other (See Comments)    NO BLOOD PRODUCTS -  Jehoval Witness  . Reglan [Metoclopramide] Hives, Itching and Rash  . Remeron [Mirtazapine] Other (See Comments)    Hallucinations and make pt loopy  . Codeine Nausea And Vomiting  . Morphine And Related Nausea And Vomiting  . Requip [Ropinirole Hcl] Other (See Comments)    Headache   . Zinc Nausea  Only   Current Meds  Medication Sig  . acetaminophen (TYLENOL) 500 MG tablet Take 500-1,000 mg by mouth every 6 (six) hours as needed for mild pain or headache.   . ALPRAZolam (XANAX) 0.5 MG tablet Take 0.5 mg by mouth 2 (two) times daily as needed for anxiety.   . Alum & Mag Hydroxide-Simeth  (MYLANTA PO) Take 20 mLs by mouth every evening.   [name] amoxicillin (AMOXIL) 500 MG tablet Take 4 tablets (2,000 mg total) by mouth as directed. 1 hour prior to dental work including cleanings  . Ascorbic Acid (VITAMIN C) 1000 MG tablet Take 1,000 mg by mouth daily.  . Cholecalciferol (VITAMIN D-3) 5000 units TABS Take 5,000 Units by mouth 3 (three) times daily.   . Cyanocobalamin 5000 MCG SUBL Place under the tongue.  [name] erythromycin ophthalmic ointment Place 1 application into both eyes at bedtime as needed (stye).   . famotidine (PEPCID) 40 MG tablet Take 1 tablet Daily for Indigestion & Heartburn  . ferrous sulfate 325 (65 FE) MG tablet Take 325 mg by mouth daily in the afternoon.  . furosemide (LASIX) 20 MG tablet Take 1 tablet (20 mg total) by mouth daily.  [name] gabapentin (NEURONTIN) 300 MG capsule Take 300 mg by mouth 2 (two) times daily.  [name] levothyroxine (SYNTHROID) 100 MCG tablet TAKE 1/2 TABLET DAILY FOR THYROID.  [name] loperamide (IMODIUM A-D) 2 MG tablet Take up to 12 tablets /day as needed for  for Diarrhea  . loratadine (CLARITIN) 10 MG tablet Take 10 mg by mouth at bedtime.   . nitrofurantoin, macrocrystal-monohydrate, (MACROBID) 100 MG capsule Take 1 capsule 2 x /day with Food for UTI  . nitroGLYCERIN (NITROSTAT) 0.4 MG SL tablet Dissolve 1 tablet under tongue every 5 minutes if needed for Angina  . ondansetron (ZOFRAN-ODT) 8 MG disintegrating tablet Dissolve 1 tablet under tongue every 6 to 8 hours  3 x /day if needed for Nausea or Vomitting  . potassium chloride (KLOR-CON) 10 MEQ tablet Take 1 tablet (10 mEq total) by mouth daily.  [name] Propylene Glycol (SYSTANE COMPLETE) 0.6 % SOLN Place 1 drop into both eyes 3 (three) times daily as needed (irritation/dry eyes.).  [name] simethicone (MYLICON) 80 MG chewable tablet Chew 80 mg by mouth every 6 (six) hours as needed for flatulence.  . sucralfate (CARAFATE) 1 g tablet Take 1 g by mouth 4 (four) times daily.  [name] tobramycin-dexamethasone (TOBRADEX)  ophthalmic solution Place 1 drop into both eyes daily as needed (stye).   [name] triamcinolone cream (KENALOG) 0.1 % Apply 1 application topically 2 (two) times daily as needed (rash/skin irritation.).   [name] verapamil (CALAN-SR) 120 MG CR tablet TAKE 1 TABLET 2 X /DAY WITH A MEAL FOR BP & HEART RHYTHM  . warfarin (COUMADIN) 2 MG tablet TAKE 1 TO 2 TABLETS AS DIRECTED TO PREVENT BLOOD CLOTS   Past Medical History:  Diagnosis Date  . Adenocarcinoma of stomach ([place]) 11/11/2015   gastric mass on egd,   . Anal fissure   . Anemia    hx 3/17 iron infusion, on aranesp and injected B12 since 11/2015.   [name] Anxiety   . Aortic root dilatation ([place])   . Arthritis    oa  . Bladder cancer ([place])   . Blind left eye 2015   partial blind in left eye due to stroke   . 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 CAD (coronary artery disease)   . Cataracts, bilateral   . Chronic kidney disease  only has one kidney rt lft rem 03 ca     dr. Risa Grill, bladder cancer 05-04-2018  . Elevated LFTs    hepatic steatosis, none recent  . Endometrial cancer ([place])   . Gastroparesis   . GERD (gastroesophageal reflux disease)   . Hearing loss    wears hearing aids  . History of uterine cancer 1980s   treated with hysterectomy, external radiation and radiation seed implants.   [name] HTN (hypertension)   . Hyperlipemia   . Hypothyroidism   . Iron deficiency anemia due to chronic blood loss 11/24/2015  . Neuropathy    right leg  . Osteomyelitis ([place]) as child  . PAF (paroxysmal atrial fibrillation) ([place])   . Presence of permanent cardiac pacemaker   . Refusal of blood transfusions as patient is Jehovah's Witness   . Restless leg syndrome   . 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 Severe aortic stenosis   . Stroke [facility]) 2015   partial stroke left legally blind; left eye   . Tachycardia-bradycardia syndrome ([place])    s/p PPM by Dr  Doreatha Lew (MDT) 07/03/10  . 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 Walker as ambulation aid   . Wears glasses   . Wears partial dentures    upper   Past Surgical History:  Procedure Laterality Date  . CARDIAC CATHETERIZATION  2008  . CARDIOVERSION N/A 12/05/2019   Procedure: CARDIOVERSION;  Surgeon: Elouise Munroe, MD;  Location: [facility] ENDOSCOPY;  Service: Cardiovascular;  Laterality: N/A;  . CHOLECYSTECTOMY  1970s  . COLONOSCOPY N/A 11/11/2015   Procedure: COLONOSCOPY;  Surgeon: [name], MD;  Location: [facility] ENDOSCOPY;  Service: Endoscopy;  Laterality: N/A;  . CYSTOSCOPY W/ RETROGRADES Right 05/01/2018   Procedure: CYSTOSCOPY WITH RIGHT RETROGRADE PYELOGRAM;  Surgeon: Lucas Mallow, MD;  Location: [facility] ORS;  Service: Urology;  Laterality: Right;  . CYSTOSCOPY W/ RETROGRADES Right 07/21/2018   Procedure: CYSTOSCOPY WITH RIGHT RETROGRADE PYELOGRAM;  Surgeon: Alexis Frock, MD;  Location: [facility] ORS;  Service: Urology;  Laterality: Right;  . ESOPHAGOGASTRODUODENOSCOPY N/A 11/11/2015   Procedure: ESOPHAGOGASTRODUODENOSCOPY (EGD);  Surgeon: [name], MD;  Location: [facility];  Service: Endoscopy;  Laterality: N/A;  . ESOPHAGOGASTRODUODENOSCOPY N/A 02/05/2016   Procedure: ESOPHAGOGASTRODUODENOSCOPY (EGD);  Surgeon: Ladene Artist, MD;  Location[facility]COPY;  Service: Endoscopy;  Laterality: N/A;  . ESOPHAGOGASTRODUODENOSCOPY N/A 04/02/2016   Procedure: ESOPHAGOGASTRODUODENOSCOPY (EGD);  Surgeon: [name], MD;  Location: [facility];  Service: Endoscopy;  Laterality: N/A;  . ESOPHAGOGASTRODUODENOSCOPY (EGD) WITH PROPOFOL N/A 04/06/2017   Procedure: ESOPHAGOGASTRODUODENOSCOPY (EGD) WITH PROPOFOL;  Surgeon: [name], MD;  [facility] ENDOSCOPY;  Service: Endoscopy;  Laterality: N/A;  . EYE SURGERY Bilateral    remove cataracts  . GASTRECTOMY N/A 01/15/2016   Procedure: OPEN PARTIAL GASTRECTOMY;  Surgeon: Stark Klein, MD;  Location: [place];  Service:  General;  Laterality: N/A;  . GASTRECTOMY Left 02/24/2016   Procedure: OPEN J TUBE;  Surgeon: Stark Klein, MD;  Location: [place];  Service: General;  Laterality: Left;  [name] GASTROJEJUNOSTOMY N/A 05/18/2016   Procedure: ROUX EN [place];  Surgeon: Stark Klein, MD;  Location: [place];  Service: General;  Laterality: N/A;  . I & D KNEE WITH POLY EXCHANGE Right 12/17/2013   Procedure: IRRIGATION AND DEBRIDEMEN RIGHT TOTAL  KNEE WITH POLY EXCHANGE;  Surgeon: Pietro Cassis  Alvan Dame, MD;  Location: [facility] ORS;  Service: Orthopedics;  Laterality: Right;  . IR GENERIC HISTORICAL  06/17/2016   IR RADIOLOGIST EVAL & MGMT 06/17/2016 Ascencion Dike, PA-C [facility] INTERV RAD  . LAPAROSCOPY N/A 01/15/2016   Procedure: LAPAROSCOPY DIAGNOSTIC;  Surgeon: Stark Klein, MD;  Location: [place];  Service: General;  Laterality: N/A;  . LYSIS OF ADHESION  05/18/2016   Procedure: LYSIS OF ADHESION;  Surgeon: Stark Klein, MD;  Location: [place];  Service: General;;  . NEPHRECTOMY Left    renal cell cancer  . PACEMAKER INSERTION  07/04/2011   MDT PPM implanted by Dr Doreatha Lew for tachy/brady; managed by Dr Thompson Grayer   . PATELLAR TENDON REPAIR Right 03/06/2014   Procedure: AVULSION WITH PATELLA TENDON REPAIR;  Surgeon: Mauri Pole, MD;  [facility];  Service: Orthopedics;  Laterality: Right;  . PATENT DUCTUS ARTERIOUS REPAIR  ~ 1949  . RIGHT/LEFT HEART CATH AND CORONARY ANGIOGRAPHY N/A 10/09/2019   Procedure: RIGHT/LEFT HEART CATH AND CORONARY ANGIOGRAPHY;  Surgeon: [name], [name], MD;  Location: [place] CV LAB;  Service: Cardiovascular;  Laterality: N/A;  . TEE WITHOUT CARDIOVERSION N/A 10/30/2019   Procedure: TRANSESOPHAGEAL ECHOCARDIOGRAM (TEE);  Surgeon: Burnell Blanks, MD;  Location: [place];  Service: Open Heart Surgery;  Laterality: N/A;  . TOTAL ABDOMINAL HYSTERECTOMY  85  . TOTAL HIP REVISION Right 2009   initial replacment surg  . TOTAL KNEE ARTHROPLASTY  02/07/2012   Procedure: TOTAL KNEE ARTHROPLASTY;   Surgeon: Mauri Pole, MD;  [facility];  Service: Orthopedics;  Laterality: Right;  . TOTAL KNEE REVISION Right 07/29/2014   Procedure: RIGHT KNEE REVISION OF PREVIOUS REPAIR EXTENSOR MECHANISM;  Surgeon: Mauri Pole, MD;  [facility];  Service: Orthopedics;  Laterality: Right;  . TRANSCATHETER AORTIC VALVE REPLACEMENT, CAROTID Left 10/30/2019   TRANSCATHETER AORTIC VALVE REPLACEMENT, LEFT CAROTID (Left Neck)   . TRANSCATHETER AORTIC VALVE REPLACEMENT, CAROTID Left 10/30/2019   Procedure: TRANSCATHETER AORTIC VALVE REPLACEMENT, LEFT CAROTID;  Surgeon: Burnell Blanks, MD;  Location: [place];  Service: Open Heart Surgery;  Laterality: Left;  . TRANSURETHRAL RESECTION OF BLADDER TUMOR N/A 07/21/2018   Procedure: TRANSURETHRAL RESECTION OF BLADDER TUMOR (TURBT);  Surgeon: Alexis Frock, MD;  [facility];  Service: Urology;  Laterality: N/A;  . TRANSURETHRAL RESECTION OF BLADDER TUMOR WITH MITOMYCIN-C N/A 05/01/2018   Procedure: TRANSURETHRAL RESECTION OF BLADDER TUMOR WITH POST OP INSTILLATION OF GEMCITABINE;  Surgeon: Lucas Mallow, MD;  [facility];  Service: Urology;  Laterality: N/A;  . WHIPPLE PROCEDURE N/A 05/18/2016   Procedure: EXPLORATORY LAPAROTOMY;  Surgeon: Stark Klein, MD;  Location: [place] [facility];  Service: General;  Laterality: N/A;   Social History   Social History Narrative   01/01/19 Lives in [place] w/dgtr, [name].  Continues to work.   family history includes Colon cancer (age of onset: 12) in her mother; Heart attack in her father; Heart disease in her father, maternal grandmother, and mother.   Review of Systems As above Recurrent UTI issues Objective:   Physical Exam BP (!) 142/70   Pulse 90   Ht 4\' 11"  (1.499 m)   Wt 96 lb (43.5 kg)   LMP  (LMP Unknown)   BMI 19.39 kg/m  Thin NAD Lungs cta Cor NL today abd soft mildly tender RLQ and epigastrium. Surgical scars, BS +  [place], CMA present.  Rectal - perianal erythema,  decreased resting tone, nontender and no mass  Anoscopy - small  amounts of clotted blood, no hemorrhoids, other lesions

## 2020-03-11 NOTE — Telephone Encounter (Signed)
Clinical pharmacist to review coumadin 

## 2020-03-12 NOTE — Telephone Encounter (Signed)
I have spoken with [name]'s daughter, she is aware of the instruction to hold coumadin for 5 days without bridging. She is aware that without lovenox bridging, it does increase the chance of stroke slightly, however her bleeding risk is too high at this time. We will defer to the GI service to restart her back on coumadin as soon as possible after the procedure.

## 2020-03-12 NOTE — Telephone Encounter (Signed)
I think with recent rectal bleeding we can't safely bridge with Lovenox. I would just hold coumadin  Matilde Pottenger [name] MD, [facility]

## 2020-03-12 NOTE — Telephone Encounter (Signed)
Patient with diagnosis of afib on warfarin for anticoagulation.    Procedure: EGD and Colonoscopy Date of procedure: 04/11/2020  CHADS2-VASc score of  8 (CHF, HTN, AGE, stroke/tia x 2, CAD, AGE, female)  With patient's hx of stroke, would typically recommend hold warfarin 5 days with a lovenox bridge, but due to patients current GI bleed, I will ask Dr. [name] to weigh in.  GI note does state that patient and daughter understand risk of stroke off of coumadin.

## 2020-03-19 ENCOUNTER — Ambulatory Visit: Payer: Medicare Other | Admitting: Gastroenterology

## 2020-03-21 ENCOUNTER — Ambulatory Visit (INDEPENDENT_AMBULATORY_CARE_PROVIDER_SITE_OTHER): Payer: Medicare Other | Admitting: Internal Medicine

## 2020-03-21 DIAGNOSIS — Z952 Presence of prosthetic heart valve: Secondary | ICD-10-CM | POA: Diagnosis not present

## 2020-03-21 DIAGNOSIS — Z950 Presence of cardiac pacemaker: Secondary | ICD-10-CM | POA: Diagnosis not present

## 2020-03-21 DIAGNOSIS — I2510 Atherosclerotic heart disease of native coronary artery without angina pectoris: Secondary | ICD-10-CM | POA: Diagnosis not present

## 2020-03-21 DIAGNOSIS — H5462 Unqualified visual loss, left eye, normal vision right eye: Secondary | ICD-10-CM | POA: Diagnosis not present

## 2020-03-21 DIAGNOSIS — K3184 Gastroparesis: Secondary | ICD-10-CM | POA: Diagnosis not present

## 2020-03-21 DIAGNOSIS — I495 Sick sinus syndrome: Secondary | ICD-10-CM | POA: Diagnosis not present

## 2020-03-21 DIAGNOSIS — E559 Vitamin D deficiency, unspecified: Secondary | ICD-10-CM | POA: Diagnosis not present

## 2020-03-21 DIAGNOSIS — Z7901 Long term (current) use of anticoagulants: Secondary | ICD-10-CM

## 2020-03-21 DIAGNOSIS — H6523 Chronic serous otitis media, bilateral: Secondary | ICD-10-CM | POA: Diagnosis not present

## 2020-03-21 DIAGNOSIS — I130 Hypertensive heart and chronic kidney disease with heart failure and stage 1 through stage 4 chronic kidney disease, or unspecified chronic kidney disease: Secondary | ICD-10-CM | POA: Diagnosis not present

## 2020-03-21 DIAGNOSIS — N183 Chronic kidney disease, stage 3 unspecified: Secondary | ICD-10-CM | POA: Diagnosis not present

## 2020-03-21 DIAGNOSIS — I272 Pulmonary hypertension, unspecified: Secondary | ICD-10-CM | POA: Diagnosis not present

## 2020-03-21 DIAGNOSIS — I5033 Acute on chronic diastolic (congestive) heart failure: Secondary | ICD-10-CM | POA: Diagnosis not present

## 2020-03-21 DIAGNOSIS — Z85028 Personal history of other malignant neoplasm of stomach: Secondary | ICD-10-CM | POA: Diagnosis not present

## 2020-03-21 DIAGNOSIS — Z85038 Personal history of other malignant neoplasm of large intestine: Secondary | ICD-10-CM | POA: Diagnosis not present

## 2020-03-21 DIAGNOSIS — E875 Hyperkalemia: Secondary | ICD-10-CM | POA: Diagnosis not present

## 2020-03-21 DIAGNOSIS — Z5181 Encounter for therapeutic drug level monitoring: Secondary | ICD-10-CM | POA: Diagnosis not present

## 2020-03-21 DIAGNOSIS — E039 Hypothyroidism, unspecified: Secondary | ICD-10-CM | POA: Diagnosis not present

## 2020-03-21 DIAGNOSIS — G2581 Restless legs syndrome: Secondary | ICD-10-CM | POA: Diagnosis not present

## 2020-03-21 DIAGNOSIS — J301 Allergic rhinitis due to pollen: Secondary | ICD-10-CM | POA: Diagnosis not present

## 2020-03-21 DIAGNOSIS — G5791 Unspecified mononeuropathy of right lower limb: Secondary | ICD-10-CM | POA: Diagnosis not present

## 2020-03-21 DIAGNOSIS — I480 Paroxysmal atrial fibrillation: Secondary | ICD-10-CM | POA: Diagnosis not present

## 2020-03-21 DIAGNOSIS — K219 Gastro-esophageal reflux disease without esophagitis: Secondary | ICD-10-CM | POA: Diagnosis not present

## 2020-03-21 DIAGNOSIS — I081 Rheumatic disorders of both mitral and tricuspid valves: Secondary | ICD-10-CM | POA: Diagnosis not present

## 2020-03-21 LAB — POCT INR: INR: 2.4 (ref 2.0–3.0)

## 2020-03-21 NOTE — Patient Instructions (Signed)
Description   Spoke with [place], [facility] RN while in pt's home, advised to have pt hold Coumadin today and tomorrow, then continue on same dosage 2mg  daily. Recheck INR in 2 weeks by [facility] RN. Colonoscopy 8/6 ok to hold 5 days (already cleared). Call us with any medication changes or concerns # [phone]. [place]'s # [phone]

## 2020-04-03 ENCOUNTER — Ambulatory Visit (INDEPENDENT_AMBULATORY_CARE_PROVIDER_SITE_OTHER): Payer: Medicare Other

## 2020-04-03 DIAGNOSIS — Z952 Presence of prosthetic heart valve: Secondary | ICD-10-CM | POA: Diagnosis not present

## 2020-04-03 DIAGNOSIS — H5462 Unqualified visual loss, left eye, normal vision right eye: Secondary | ICD-10-CM | POA: Diagnosis not present

## 2020-04-03 DIAGNOSIS — N183 Chronic kidney disease, stage 3 unspecified: Secondary | ICD-10-CM | POA: Diagnosis not present

## 2020-04-03 DIAGNOSIS — G5791 Unspecified mononeuropathy of right lower limb: Secondary | ICD-10-CM | POA: Diagnosis not present

## 2020-04-03 DIAGNOSIS — E875 Hyperkalemia: Secondary | ICD-10-CM | POA: Diagnosis not present

## 2020-04-03 DIAGNOSIS — H6523 Chronic serous otitis media, bilateral: Secondary | ICD-10-CM | POA: Diagnosis not present

## 2020-04-03 DIAGNOSIS — K3184 Gastroparesis: Secondary | ICD-10-CM | POA: Diagnosis not present

## 2020-04-03 DIAGNOSIS — Z85038 Personal history of other malignant neoplasm of large intestine: Secondary | ICD-10-CM | POA: Diagnosis not present

## 2020-04-03 DIAGNOSIS — E039 Hypothyroidism, unspecified: Secondary | ICD-10-CM | POA: Diagnosis not present

## 2020-04-03 DIAGNOSIS — K219 Gastro-esophageal reflux disease without esophagitis: Secondary | ICD-10-CM | POA: Diagnosis not present

## 2020-04-03 DIAGNOSIS — E559 Vitamin D deficiency, unspecified: Secondary | ICD-10-CM | POA: Diagnosis not present

## 2020-04-03 DIAGNOSIS — I272 Pulmonary hypertension, unspecified: Secondary | ICD-10-CM | POA: Diagnosis not present

## 2020-04-03 DIAGNOSIS — I480 Paroxysmal atrial fibrillation: Secondary | ICD-10-CM | POA: Diagnosis not present

## 2020-04-03 DIAGNOSIS — I4891 Unspecified atrial fibrillation: Secondary | ICD-10-CM

## 2020-04-03 DIAGNOSIS — Z85028 Personal history of other malignant neoplasm of stomach: Secondary | ICD-10-CM | POA: Diagnosis not present

## 2020-04-03 DIAGNOSIS — Z7901 Long term (current) use of anticoagulants: Secondary | ICD-10-CM

## 2020-04-03 DIAGNOSIS — I495 Sick sinus syndrome: Secondary | ICD-10-CM | POA: Diagnosis not present

## 2020-04-03 DIAGNOSIS — Z5181 Encounter for therapeutic drug level monitoring: Secondary | ICD-10-CM | POA: Diagnosis not present

## 2020-04-03 DIAGNOSIS — G2581 Restless legs syndrome: Secondary | ICD-10-CM | POA: Diagnosis not present

## 2020-04-03 DIAGNOSIS — J301 Allergic rhinitis due to pollen: Secondary | ICD-10-CM | POA: Diagnosis not present

## 2020-04-03 DIAGNOSIS — I130 Hypertensive heart and chronic kidney disease with heart failure and stage 1 through stage 4 chronic kidney disease, or unspecified chronic kidney disease: Secondary | ICD-10-CM | POA: Diagnosis not present

## 2020-04-03 DIAGNOSIS — I5033 Acute on chronic diastolic (congestive) heart failure: Secondary | ICD-10-CM | POA: Diagnosis not present

## 2020-04-03 DIAGNOSIS — Z950 Presence of cardiac pacemaker: Secondary | ICD-10-CM | POA: Diagnosis not present

## 2020-04-03 DIAGNOSIS — I081 Rheumatic disorders of both mitral and tricuspid valves: Secondary | ICD-10-CM | POA: Diagnosis not present

## 2020-04-03 DIAGNOSIS — I2510 Atherosclerotic heart disease of native coronary artery without angina pectoris: Secondary | ICD-10-CM | POA: Diagnosis not present

## 2020-04-03 LAB — POCT INR: INR: 3.6 — AB (ref 2.0–3.0)

## 2020-04-03 NOTE — Patient Instructions (Signed)
Description   Spoke with [place], [facility] RN while in pt's home, advised to have pt hold Coumadin today, then resume same dosage 2mg  daily. Pt is having colonoscopy 04/11/20, she has been cleared to hold Warfarin x 5 days prior to procedure without Lovenox bridging. Will resume Warfarin 2mg  daily after procedure once MD states ok to do so. Recheck INR in 1 week after colonoscopy by [facility] RN.  Call us with any medication changes or concerns # [phone]. [place]'s # [phone]

## 2020-04-04 ENCOUNTER — Encounter: Payer: Self-pay | Admitting: Internal Medicine

## 2020-04-07 ENCOUNTER — Ambulatory Visit (INDEPENDENT_AMBULATORY_CARE_PROVIDER_SITE_OTHER): Payer: Medicare Other

## 2020-04-07 ENCOUNTER — Other Ambulatory Visit: Payer: Self-pay

## 2020-04-07 VITALS — BP 146/60 | HR 72 | Temp 97.5°F | Ht 59.0 in | Wt 95.6 lb

## 2020-04-07 DIAGNOSIS — Z79899 Other long term (current) drug therapy: Secondary | ICD-10-CM

## 2020-04-07 MED ORDER — FUROSEMIDE 20 MG PO TABS: 20.0000 mg | ORAL_TABLET | Freq: Every day | ORAL | 3 refills | Status: AC

## 2020-04-07 NOTE — Addendum Note (Signed)
Addended by: Chancy Hurter on: 04/07/2020 10:41 AM   Modules accepted: Orders

## 2020-04-07 NOTE — Progress Notes (Signed)
Refill request for Furosemide, 20mg 

## 2020-04-07 NOTE — Progress Notes (Signed)
Patient presents to the office for a nurse visit to provide a urine specimen. Patient states that she doesn't have any signs and symptoms for a UTI. Vitals taken and recorded.

## 2020-04-09 LAB — URINALYSIS, ROUTINE W REFLEX MICROSCOPIC
Bilirubin Urine: NEGATIVE
Glucose, UA: NEGATIVE
Hgb urine dipstick: NEGATIVE
Ketones, ur: NEGATIVE
Leukocytes,Ua: NEGATIVE
Nitrite: NEGATIVE
Protein, ur: NEGATIVE
Specific Gravity, Urine: 1.009 (ref 1.001–1.03)
pH: <=5 (ref 5.0–8.0)

## 2020-04-09 LAB — URINE CULTURE
MICRO NUMBER:: 10776994
SPECIMEN QUALITY:: ADEQUATE

## 2020-04-09 NOTE — Progress Notes (Signed)
===========================================================  -    Urine Culture - OK  now - No Infection ===========================================================

## 2020-04-11 ENCOUNTER — Encounter: Payer: Self-pay | Admitting: Internal Medicine

## 2020-04-11 ENCOUNTER — Ambulatory Visit (AMBULATORY_SURGERY_CENTER): Payer: Medicare Other | Admitting: Internal Medicine

## 2020-04-11 ENCOUNTER — Other Ambulatory Visit: Payer: Self-pay

## 2020-04-11 VITALS — BP 142/50 | HR 60 | Temp 98.4°F | Resp 16 | Ht 59.0 in | Wt 96.0 lb

## 2020-04-11 DIAGNOSIS — K5730 Diverticulosis of large intestine without perforation or abscess without bleeding: Secondary | ICD-10-CM

## 2020-04-11 DIAGNOSIS — R1013 Epigastric pain: Secondary | ICD-10-CM

## 2020-04-11 DIAGNOSIS — D122 Benign neoplasm of ascending colon: Secondary | ICD-10-CM

## 2020-04-11 DIAGNOSIS — D120 Benign neoplasm of cecum: Secondary | ICD-10-CM

## 2020-04-11 DIAGNOSIS — C182 Malignant neoplasm of ascending colon: Secondary | ICD-10-CM | POA: Diagnosis not present

## 2020-04-11 DIAGNOSIS — K625 Hemorrhage of anus and rectum: Secondary | ICD-10-CM

## 2020-04-11 DIAGNOSIS — K649 Unspecified hemorrhoids: Secondary | ICD-10-CM | POA: Diagnosis not present

## 2020-04-11 MED ORDER — HYDROCORTISONE (PERIANAL) 2.5 % EX CREA
TOPICAL_CREAM | Freq: Two times a day (BID) | CUTANEOUS | 1 refills | Status: AC
Start: 2020-04-11 — End: ?

## 2020-04-11 MED ORDER — SODIUM CHLORIDE 0.9 % IV SOLN: 500.0000 mL | Freq: Once | INTRAVENOUS | Status: DC

## 2020-04-11 NOTE — Op Note (Signed)
[place] Patient Name: [name] Procedure Date: 04/11/2020 2:48 PM MRN: [id number] Endoscopist: [name] , MD Age: 86 Referring MD:  Date of Birth: [birth year] Gender: Female Account #: [id number] Procedure:                Upper GI endoscopy Indications:              Epigastric abdominal pain Medicines:                Propofol per Anesthesia, Monitored Anesthesia Care Procedure:                Pre-Anesthesia Assessment:                           - Prior to the procedure, a History and Physical                            was performed, and patient medications and                            allergies were reviewed. The patient's tolerance of                            previous anesthesia was also reviewed. The risks                            and benefits of the procedure and the sedation                            options and risks were discussed with the patient.                            All questions were answered, and informed consent                            was obtained. Prior Anticoagulants: The patient                            last took Coumadin (warfarin) 5 days prior to the                            procedure. ASA Grade Assessment: III - A patient                            with severe systemic disease. After reviewing the                            risks and benefits, the patient was deemed in                            satisfactory condition to undergo the procedure.                           After obtaining informed consent, the endoscope was  passed under direct vision. Throughout the                            procedure, the patient's blood pressure, pulse, and                            oxygen saturations were monitored continuously. The                            Endoscope was introduced through the mouth, and                            advanced to the efferent jejunal loop. The upper GI                             endoscopy was accomplished without difficulty. The                            patient tolerated the procedure well. Scope In: Scope Out: Findings:                 The examined esophagus was normal.                           Evidence of a Roux-en-Y gastrojejunostomy was                            found. The gastrojejunal anastomosis was                            characterized by healthy appearing mucosa.                           The examined jejunum was normal. Complications:            No immediate complications. Estimated Blood Loss:     Estimated blood loss: none. Impression:               - Normal esophagus.                           - Roux-en-Y gastrojejunostomy with gastrojejunal                            anastomosis characterized by healthy appearing                            mucosa.                           - Normal examined jejunum.                           - No specimens collected. Recommendation:           - Patient has a contact number available for  emergencies. The signs and symptoms of potential                            delayed complications were discussed with the                            patient. Return to normal activities tomorrow.                            Written discharge instructions were provided to the                            patient.                           - Resume previous diet.                           - Continue present medications.                           - Resume Coumadin (warfarin) at prior dose in 2                            days.                           - See the other procedure note for documentation of                            additional recommendations. [name], MD 04/11/2020 3:48:04 PM This report has been signed electronically.

## 2020-04-11 NOTE — Progress Notes (Signed)
VS taken by C.W. 

## 2020-04-11 NOTE — Patient Instructions (Addendum)
The upper exam looks like it should after surgery.  I think you have been bleeding from hemorrhoids - also saw polyps (5 removed - but way at beginning of colon and not bleeding)    Will treat hydrocortisone cream.  If that does not work will see what else (?banding).  I will call results and check on you likely next week.  Restart warfarin Sunday 8/8 PM as recommended by anti-coagulation clinic.  I appreciate the opportunity to care for you. [name], MD, [facility]  Handouts given:   Diverticulosis, hemorrhoids, polyps Resume previous diet Continue current medications Resume coumadin in 2 days Await pathology results Start HC cream for hemorrhoids   YOU HAD AN ENDOSCOPIC PROCEDURE TODAY AT [facility]:   Refer to the procedure report that was given to you for any specific questions about what was found during the examination.  If the procedure report does not answer your questions, please call your gastroenterologist to clarify.  If you requested that your care partner not be given the details of your procedure findings, then the procedure report has been included in a sealed envelope for you to review at your convenience later.  YOU SHOULD EXPECT: Some feelings of bloating in the abdomen. Passage of more gas than usual.  Walking can help get rid of the air that was put into your GI tract during the procedure and reduce the bloating. If you had a lower endoscopy (such as a colonoscopy or flexible sigmoidoscopy) you may notice spotting of blood in your stool or on the toilet paper. If you underwent a bowel prep for your procedure, you may not have a normal bowel movement for a few days.  Please Note:  You might notice some irritation and congestion in your nose or some drainage.  This is from the oxygen used during your procedure.  There is no need for concern and it should clear up in a day or so.  SYMPTOMS TO REPORT IMMEDIATELY:   Following lower endoscopy  (colonoscopy or flexible sigmoidoscopy):  Excessive amounts of blood in the stool  Significant tenderness or worsening of abdominal pains  Swelling of the abdomen that is new, acute  Fever of 100F or higher   Following upper endoscopy (EGD)  Vomiting of blood or coffee ground material  New chest pain or pain under the shoulder blades  Painful or persistently difficult swallowing  New shortness of breath  Fever of 100F or higher  Black, tarry-looking stools  For urgent or emergent issues, a gastroenterologist can be reached at any hour by calling [phone]. Do not use MyChart messaging for urgent concerns.    DIET:  We do recommend a small meal at first, but then you may proceed to your regular diet.  Drink plenty of fluids but you should avoid alcoholic beverages for 24 hours.  ACTIVITY:  You should plan to take it easy for the rest of today and you should NOT DRIVE or use heavy machinery until tomorrow (because of the sedation medicines used during the test).    FOLLOW UP: Our staff will call the number listed on your records 48-72 hours following your procedure to check on you and address any questions or concerns that you may have regarding the information given to you following your procedure. If we do not reach you, we will leave a message.  We will attempt to reach you two times.  During this call, we will ask if you have developed any symptoms  of COVID 19. If you develop any symptoms (ie: fever, flu-like symptoms, shortness of breath, cough etc.) before then, please call [phone].  If you test positive for Covid 19 in the 2 weeks post procedure, please call and report this information to [place].    If any biopsies were taken you will be contacted by phone or by letter within the next 1-3 weeks.  Please call us at [phone] if you have not heard about the biopsies in 3 weeks.    SIGNATURES/CONFIDENTIALITY: You and/or your care partner have signed paperwork which will  be entered into your electronic medical record.  These signatures attest to the fact that that the information above on your After Visit Summary has been reviewed and is understood.  Full responsibility of the confidentiality of this discharge information lies with you and/or your care-partner.

## 2020-04-11 NOTE — Progress Notes (Signed)
pt tolerated well. VSS. awake and to recovery. Report given to RN. Bite block inserted and removed without trauma. 

## 2020-04-11 NOTE — Progress Notes (Signed)
Called to room to assist during endoscopic procedure.  Patient ID and intended procedure confirmed with present staff. Received instructions for my participation in the procedure from the performing physician.  

## 2020-04-11 NOTE — Op Note (Signed)
[place] Patient Name: [name] Procedure Date: 04/11/2020 2:41 PM MRN: [id number] Endoscopist: [name] , MD Age: 87 Referring MD:  Date of Birth: [birth year] Gender: Female Account #: [id number] Procedure:                Colonoscopy Indications:              Rectal bleeding Medicines:                Propofol per Anesthesia, Monitored Anesthesia Care Procedure:                Pre-Anesthesia Assessment:                           - Prior to the procedure, a History and Physical                            was performed, and patient medications and                            allergies were reviewed. The patient's tolerance of                            previous anesthesia was also reviewed. The risks                            and benefits of the procedure and the sedation                            options and risks were discussed with the patient.                            All questions were answered, and informed consent                            was obtained. Prior Anticoagulants: The patient                            last took Coumadin (warfarin) 5 days prior to the                            procedure. ASA Grade Assessment: III - A patient                            with severe systemic disease. After reviewing the                            risks and benefits, the patient was deemed in                            satisfactory condition to undergo the procedure.                           After obtaining informed consent, the colonoscope  was passed under direct vision. Throughout the                            procedure, the patient's blood pressure, pulse, and                            oxygen saturations were monitored continuously. The                            Colonoscope was introduced through the anus and                            advanced to the the cecum, identified by                            appendiceal orifice and  ileocecal valve. The                            colonoscopy was performed without difficulty. The                            patient tolerated the procedure well. The quality                            of the bowel preparation was good. The ileocecal                            valve, appendiceal orifice, and rectum were                            photographed. The bowel preparation used was                            Miralax via split dose instruction. Scope In: 3:07:02 PM Scope Out: 3:29:43 PM Scope Withdrawal Time: 0 hours 17 minutes 54 seconds  Total Procedure Duration: 0 hours 22 minutes 41 seconds  Findings:                 The perianal and digital rectal examinations were                            normal.                           Five sessile polyps were found in the ascending                            colon and cecum. The polyps were 2 to 25 mm in                            size. These polyps were removed with a cold snare.                            Resection and retrieval were complete. Verification  of patient identification for the specimen was                            done. Estimated blood loss was minimal.                           Multiple small-mouthed diverticula were found in                            the sigmoid colon.                           Internal hemorrhoids were found.                           The exam was otherwise without abnormality on                            direct and retroflexion views. Complications:            No immediate complications. Estimated Blood Loss:     Estimated blood loss was minimal. Impression:               - Five 2 to 25 mm polyps in the ascending colon and                            in the cecum, removed with a cold snare. Resected                            and retrieved.                           - Diverticulosis in the sigmoid colon.                           - Internal hemorrhoids. Think cause of  bleeding                           - The examination was otherwise normal on direct                            and retroflexion views. Recommendation:           - Patient has a contact number available for                            emergencies. The signs and symptoms of potential                            delayed complications were discussed with the                            patient. Return to normal activities tomorrow.                            Written discharge instructions were provided to the  patient.                           - Resume previous diet.                           - Continue present medications.                           - Resume Coumadin (warfarin) at prior dose in 2                            days.                           - Await pathology results.                           - No recommendation at this time regarding repeat                            colonoscopy due to age.                           - HC cream for hemorrhoids - hold banding in reserve [name], MD 04/11/2020 3:52:38 PM This report has been signed electronically.

## 2020-04-15 ENCOUNTER — Telehealth: Payer: Self-pay | Admitting: *Deleted

## 2020-04-15 NOTE — Telephone Encounter (Signed)
  Follow up Call-  Call back number 04/11/2020  Post procedure Call Back phone  # [phone]  Permission to leave phone message Yes  Some recent data might be hidden    Spoke with daughter Patient questions:  Do you have a fever, pain , or abdominal swelling? No. Pain Score  0 *  Have you tolerated food without any problems? Yes.    Have you been able to return to your normal activities? Yes.    Do you have any questions about your discharge instructions: Diet   No. Medications  No. Follow up visit  No.  Do you have questions or concerns about your Care? No.  Actions: * If pain score is 4 or above: No action needed, pain <4.  1. Have you developed a fever since your procedure? no  2.   Have you had an respiratory symptoms (SOB or cough) since your procedure? no  3.   Have you tested positive for COVID 19 since your procedure no  4.   Have you had any family members/close contacts diagnosed with the COVID 19 since your procedure?  no   If yes to any of these questions please route to Joylene John, RN and Erenest Rasher, RN

## 2020-04-17 ENCOUNTER — Ambulatory Visit (INDEPENDENT_AMBULATORY_CARE_PROVIDER_SITE_OTHER): Payer: Medicare Other

## 2020-04-17 DIAGNOSIS — Z5181 Encounter for therapeutic drug level monitoring: Secondary | ICD-10-CM | POA: Diagnosis not present

## 2020-04-17 DIAGNOSIS — I081 Rheumatic disorders of both mitral and tricuspid valves: Secondary | ICD-10-CM | POA: Diagnosis not present

## 2020-04-17 DIAGNOSIS — E559 Vitamin D deficiency, unspecified: Secondary | ICD-10-CM | POA: Diagnosis not present

## 2020-04-17 DIAGNOSIS — Z950 Presence of cardiac pacemaker: Secondary | ICD-10-CM | POA: Diagnosis not present

## 2020-04-17 DIAGNOSIS — Z7901 Long term (current) use of anticoagulants: Secondary | ICD-10-CM

## 2020-04-17 DIAGNOSIS — I480 Paroxysmal atrial fibrillation: Secondary | ICD-10-CM | POA: Diagnosis not present

## 2020-04-17 DIAGNOSIS — E039 Hypothyroidism, unspecified: Secondary | ICD-10-CM | POA: Diagnosis not present

## 2020-04-17 DIAGNOSIS — G5791 Unspecified mononeuropathy of right lower limb: Secondary | ICD-10-CM | POA: Diagnosis not present

## 2020-04-17 DIAGNOSIS — I130 Hypertensive heart and chronic kidney disease with heart failure and stage 1 through stage 4 chronic kidney disease, or unspecified chronic kidney disease: Secondary | ICD-10-CM | POA: Diagnosis not present

## 2020-04-17 DIAGNOSIS — I272 Pulmonary hypertension, unspecified: Secondary | ICD-10-CM | POA: Diagnosis not present

## 2020-04-17 DIAGNOSIS — I495 Sick sinus syndrome: Secondary | ICD-10-CM | POA: Diagnosis not present

## 2020-04-17 DIAGNOSIS — Z85028 Personal history of other malignant neoplasm of stomach: Secondary | ICD-10-CM | POA: Diagnosis not present

## 2020-04-17 DIAGNOSIS — H6523 Chronic serous otitis media, bilateral: Secondary | ICD-10-CM | POA: Diagnosis not present

## 2020-04-17 DIAGNOSIS — Z85038 Personal history of other malignant neoplasm of large intestine: Secondary | ICD-10-CM | POA: Diagnosis not present

## 2020-04-17 DIAGNOSIS — K219 Gastro-esophageal reflux disease without esophagitis: Secondary | ICD-10-CM | POA: Diagnosis not present

## 2020-04-17 DIAGNOSIS — E875 Hyperkalemia: Secondary | ICD-10-CM | POA: Diagnosis not present

## 2020-04-17 DIAGNOSIS — G2581 Restless legs syndrome: Secondary | ICD-10-CM | POA: Diagnosis not present

## 2020-04-17 DIAGNOSIS — N183 Chronic kidney disease, stage 3 unspecified: Secondary | ICD-10-CM | POA: Diagnosis not present

## 2020-04-17 DIAGNOSIS — I2510 Atherosclerotic heart disease of native coronary artery without angina pectoris: Secondary | ICD-10-CM | POA: Diagnosis not present

## 2020-04-17 DIAGNOSIS — H5462 Unqualified visual loss, left eye, normal vision right eye: Secondary | ICD-10-CM | POA: Diagnosis not present

## 2020-04-17 DIAGNOSIS — I4891 Unspecified atrial fibrillation: Secondary | ICD-10-CM

## 2020-04-17 DIAGNOSIS — J301 Allergic rhinitis due to pollen: Secondary | ICD-10-CM | POA: Diagnosis not present

## 2020-04-17 DIAGNOSIS — K3184 Gastroparesis: Secondary | ICD-10-CM | POA: Diagnosis not present

## 2020-04-17 DIAGNOSIS — I5033 Acute on chronic diastolic (congestive) heart failure: Secondary | ICD-10-CM | POA: Diagnosis not present

## 2020-04-17 DIAGNOSIS — Z952 Presence of prosthetic heart valve: Secondary | ICD-10-CM | POA: Diagnosis not present

## 2020-04-17 LAB — POCT INR: INR: 1.4 — AB (ref 2.0–3.0)

## 2020-04-17 NOTE — Patient Instructions (Signed)
Description   Spoke with Lawerance Bach, [facility] RN while in pt's home, advised to have pt take 3mg  (1.5 tablets) today and tomorrow, then resume same dosage 2mg  daily. Recheck INR in 1 week.  Call us with any medication changes or concerns # [phone]. [place]'s # [phone]

## 2020-04-24 ENCOUNTER — Ambulatory Visit (INDEPENDENT_AMBULATORY_CARE_PROVIDER_SITE_OTHER): Payer: Medicare Other | Admitting: *Deleted

## 2020-04-24 DIAGNOSIS — Z85028 Personal history of other malignant neoplasm of stomach: Secondary | ICD-10-CM | POA: Diagnosis not present

## 2020-04-24 DIAGNOSIS — I4891 Unspecified atrial fibrillation: Secondary | ICD-10-CM

## 2020-04-24 DIAGNOSIS — H5462 Unqualified visual loss, left eye, normal vision right eye: Secondary | ICD-10-CM | POA: Diagnosis not present

## 2020-04-24 DIAGNOSIS — Z7901 Long term (current) use of anticoagulants: Secondary | ICD-10-CM | POA: Diagnosis not present

## 2020-04-24 DIAGNOSIS — I272 Pulmonary hypertension, unspecified: Secondary | ICD-10-CM | POA: Diagnosis not present

## 2020-04-24 DIAGNOSIS — H6523 Chronic serous otitis media, bilateral: Secondary | ICD-10-CM | POA: Diagnosis not present

## 2020-04-24 DIAGNOSIS — I5033 Acute on chronic diastolic (congestive) heart failure: Secondary | ICD-10-CM | POA: Diagnosis not present

## 2020-04-24 DIAGNOSIS — I081 Rheumatic disorders of both mitral and tricuspid valves: Secondary | ICD-10-CM | POA: Diagnosis not present

## 2020-04-24 DIAGNOSIS — I2510 Atherosclerotic heart disease of native coronary artery without angina pectoris: Secondary | ICD-10-CM | POA: Diagnosis not present

## 2020-04-24 DIAGNOSIS — E875 Hyperkalemia: Secondary | ICD-10-CM | POA: Diagnosis not present

## 2020-04-24 DIAGNOSIS — E559 Vitamin D deficiency, unspecified: Secondary | ICD-10-CM | POA: Diagnosis not present

## 2020-04-24 DIAGNOSIS — Z950 Presence of cardiac pacemaker: Secondary | ICD-10-CM | POA: Diagnosis not present

## 2020-04-24 DIAGNOSIS — I480 Paroxysmal atrial fibrillation: Secondary | ICD-10-CM | POA: Diagnosis not present

## 2020-04-24 DIAGNOSIS — N183 Chronic kidney disease, stage 3 unspecified: Secondary | ICD-10-CM | POA: Diagnosis not present

## 2020-04-24 DIAGNOSIS — G2581 Restless legs syndrome: Secondary | ICD-10-CM | POA: Diagnosis not present

## 2020-04-24 DIAGNOSIS — E039 Hypothyroidism, unspecified: Secondary | ICD-10-CM | POA: Diagnosis not present

## 2020-04-24 DIAGNOSIS — I130 Hypertensive heart and chronic kidney disease with heart failure and stage 1 through stage 4 chronic kidney disease, or unspecified chronic kidney disease: Secondary | ICD-10-CM | POA: Diagnosis not present

## 2020-04-24 DIAGNOSIS — K3184 Gastroparesis: Secondary | ICD-10-CM | POA: Diagnosis not present

## 2020-04-24 DIAGNOSIS — Z952 Presence of prosthetic heart valve: Secondary | ICD-10-CM | POA: Diagnosis not present

## 2020-04-24 DIAGNOSIS — K219 Gastro-esophageal reflux disease without esophagitis: Secondary | ICD-10-CM | POA: Diagnosis not present

## 2020-04-24 DIAGNOSIS — I495 Sick sinus syndrome: Secondary | ICD-10-CM | POA: Diagnosis not present

## 2020-04-24 DIAGNOSIS — J301 Allergic rhinitis due to pollen: Secondary | ICD-10-CM | POA: Diagnosis not present

## 2020-04-24 DIAGNOSIS — G5791 Unspecified mononeuropathy of right lower limb: Secondary | ICD-10-CM | POA: Diagnosis not present

## 2020-04-24 DIAGNOSIS — Z85038 Personal history of other malignant neoplasm of large intestine: Secondary | ICD-10-CM | POA: Diagnosis not present

## 2020-04-24 DIAGNOSIS — Z5181 Encounter for therapeutic drug level monitoring: Secondary | ICD-10-CM | POA: Diagnosis not present

## 2020-04-24 LAB — POCT INR: INR: 2.6 (ref 2.0–3.0)

## 2020-04-25 ENCOUNTER — Telehealth: Payer: Self-pay | Admitting: Cardiology

## 2020-04-25 DIAGNOSIS — I480 Paroxysmal atrial fibrillation: Secondary | ICD-10-CM | POA: Diagnosis not present

## 2020-04-25 DIAGNOSIS — I2510 Atherosclerotic heart disease of native coronary artery without angina pectoris: Secondary | ICD-10-CM | POA: Diagnosis not present

## 2020-04-25 DIAGNOSIS — H5462 Unqualified visual loss, left eye, normal vision right eye: Secondary | ICD-10-CM | POA: Diagnosis not present

## 2020-04-25 DIAGNOSIS — Z950 Presence of cardiac pacemaker: Secondary | ICD-10-CM | POA: Diagnosis not present

## 2020-04-25 DIAGNOSIS — G2581 Restless legs syndrome: Secondary | ICD-10-CM | POA: Diagnosis not present

## 2020-04-25 DIAGNOSIS — E039 Hypothyroidism, unspecified: Secondary | ICD-10-CM | POA: Diagnosis not present

## 2020-04-25 DIAGNOSIS — G5791 Unspecified mononeuropathy of right lower limb: Secondary | ICD-10-CM | POA: Diagnosis not present

## 2020-04-25 DIAGNOSIS — Z5181 Encounter for therapeutic drug level monitoring: Secondary | ICD-10-CM | POA: Diagnosis not present

## 2020-04-25 DIAGNOSIS — N183 Chronic kidney disease, stage 3 unspecified: Secondary | ICD-10-CM | POA: Diagnosis not present

## 2020-04-25 DIAGNOSIS — I5033 Acute on chronic diastolic (congestive) heart failure: Secondary | ICD-10-CM | POA: Diagnosis not present

## 2020-04-25 DIAGNOSIS — I495 Sick sinus syndrome: Secondary | ICD-10-CM | POA: Diagnosis not present

## 2020-04-25 DIAGNOSIS — Z7901 Long term (current) use of anticoagulants: Secondary | ICD-10-CM | POA: Diagnosis not present

## 2020-04-25 DIAGNOSIS — I130 Hypertensive heart and chronic kidney disease with heart failure and stage 1 through stage 4 chronic kidney disease, or unspecified chronic kidney disease: Secondary | ICD-10-CM | POA: Diagnosis not present

## 2020-04-25 DIAGNOSIS — E559 Vitamin D deficiency, unspecified: Secondary | ICD-10-CM | POA: Diagnosis not present

## 2020-04-25 DIAGNOSIS — H6523 Chronic serous otitis media, bilateral: Secondary | ICD-10-CM | POA: Diagnosis not present

## 2020-04-25 DIAGNOSIS — I081 Rheumatic disorders of both mitral and tricuspid valves: Secondary | ICD-10-CM | POA: Diagnosis not present

## 2020-04-25 DIAGNOSIS — Z952 Presence of prosthetic heart valve: Secondary | ICD-10-CM | POA: Diagnosis not present

## 2020-04-25 DIAGNOSIS — K219 Gastro-esophageal reflux disease without esophagitis: Secondary | ICD-10-CM | POA: Diagnosis not present

## 2020-04-25 DIAGNOSIS — Z85038 Personal history of other malignant neoplasm of large intestine: Secondary | ICD-10-CM | POA: Diagnosis not present

## 2020-04-25 DIAGNOSIS — E875 Hyperkalemia: Secondary | ICD-10-CM | POA: Diagnosis not present

## 2020-04-25 DIAGNOSIS — J301 Allergic rhinitis due to pollen: Secondary | ICD-10-CM | POA: Diagnosis not present

## 2020-04-25 DIAGNOSIS — Z85028 Personal history of other malignant neoplasm of stomach: Secondary | ICD-10-CM | POA: Diagnosis not present

## 2020-04-25 DIAGNOSIS — I272 Pulmonary hypertension, unspecified: Secondary | ICD-10-CM | POA: Diagnosis not present

## 2020-04-25 DIAGNOSIS — K3184 Gastroparesis: Secondary | ICD-10-CM | POA: Diagnosis not present

## 2020-04-25 NOTE — Telephone Encounter (Signed)
[name] is calling to follow up. She states skilled nursing will be for for medication management, labs, and a cardiac pulmonary assessment.

## 2020-04-25 NOTE — Telephone Encounter (Signed)
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 Verbal order given.

## 2020-04-25 NOTE — Telephone Encounter (Signed)
Left message for pamela, what is the patient needing skilled nursing for?

## 2020-04-25 NOTE — Telephone Encounter (Signed)
Left message for [name] to call. We have not seen the patient since June. What is going on with her?

## 2020-04-25 NOTE — Telephone Encounter (Signed)
[name] with [facility] is requesting an order for skilled nursing. Please call.

## 2020-05-01 ENCOUNTER — Ambulatory Visit (INDEPENDENT_AMBULATORY_CARE_PROVIDER_SITE_OTHER): Payer: Medicare Other

## 2020-05-01 DIAGNOSIS — I272 Pulmonary hypertension, unspecified: Secondary | ICD-10-CM | POA: Diagnosis not present

## 2020-05-01 DIAGNOSIS — K219 Gastro-esophageal reflux disease without esophagitis: Secondary | ICD-10-CM | POA: Diagnosis not present

## 2020-05-01 DIAGNOSIS — E875 Hyperkalemia: Secondary | ICD-10-CM | POA: Diagnosis not present

## 2020-05-01 DIAGNOSIS — J301 Allergic rhinitis due to pollen: Secondary | ICD-10-CM | POA: Diagnosis not present

## 2020-05-01 DIAGNOSIS — Z85038 Personal history of other malignant neoplasm of large intestine: Secondary | ICD-10-CM | POA: Diagnosis not present

## 2020-05-01 DIAGNOSIS — I4891 Unspecified atrial fibrillation: Secondary | ICD-10-CM

## 2020-05-01 DIAGNOSIS — Z85028 Personal history of other malignant neoplasm of stomach: Secondary | ICD-10-CM | POA: Diagnosis not present

## 2020-05-01 DIAGNOSIS — Z952 Presence of prosthetic heart valve: Secondary | ICD-10-CM | POA: Diagnosis not present

## 2020-05-01 DIAGNOSIS — I081 Rheumatic disorders of both mitral and tricuspid valves: Secondary | ICD-10-CM | POA: Diagnosis not present

## 2020-05-01 DIAGNOSIS — I495 Sick sinus syndrome: Secondary | ICD-10-CM | POA: Diagnosis not present

## 2020-05-01 DIAGNOSIS — G5791 Unspecified mononeuropathy of right lower limb: Secondary | ICD-10-CM | POA: Diagnosis not present

## 2020-05-01 DIAGNOSIS — H5462 Unqualified visual loss, left eye, normal vision right eye: Secondary | ICD-10-CM | POA: Diagnosis not present

## 2020-05-01 DIAGNOSIS — I5033 Acute on chronic diastolic (congestive) heart failure: Secondary | ICD-10-CM | POA: Diagnosis not present

## 2020-05-01 DIAGNOSIS — H6523 Chronic serous otitis media, bilateral: Secondary | ICD-10-CM | POA: Diagnosis not present

## 2020-05-01 DIAGNOSIS — E039 Hypothyroidism, unspecified: Secondary | ICD-10-CM | POA: Diagnosis not present

## 2020-05-01 DIAGNOSIS — Z950 Presence of cardiac pacemaker: Secondary | ICD-10-CM | POA: Diagnosis not present

## 2020-05-01 DIAGNOSIS — I2510 Atherosclerotic heart disease of native coronary artery without angina pectoris: Secondary | ICD-10-CM | POA: Diagnosis not present

## 2020-05-01 DIAGNOSIS — K3184 Gastroparesis: Secondary | ICD-10-CM | POA: Diagnosis not present

## 2020-05-01 DIAGNOSIS — E559 Vitamin D deficiency, unspecified: Secondary | ICD-10-CM | POA: Diagnosis not present

## 2020-05-01 DIAGNOSIS — G2581 Restless legs syndrome: Secondary | ICD-10-CM | POA: Diagnosis not present

## 2020-05-01 DIAGNOSIS — N183 Chronic kidney disease, stage 3 unspecified: Secondary | ICD-10-CM | POA: Diagnosis not present

## 2020-05-01 DIAGNOSIS — I130 Hypertensive heart and chronic kidney disease with heart failure and stage 1 through stage 4 chronic kidney disease, or unspecified chronic kidney disease: Secondary | ICD-10-CM | POA: Diagnosis not present

## 2020-05-01 DIAGNOSIS — Z7901 Long term (current) use of anticoagulants: Secondary | ICD-10-CM

## 2020-05-01 DIAGNOSIS — I480 Paroxysmal atrial fibrillation: Secondary | ICD-10-CM | POA: Diagnosis not present

## 2020-05-01 DIAGNOSIS — Z5181 Encounter for therapeutic drug level monitoring: Secondary | ICD-10-CM | POA: Diagnosis not present

## 2020-05-01 LAB — POCT INR: INR: 3 (ref 2.0–3.0)

## 2020-05-01 NOTE — Patient Instructions (Signed)
Description   Spoke with Lawerance Bach, [facility] RN while in pt's home, advised to have pt continue on same dosage 2mg  daily. Recheck INR in 1 week.  Call us with any medication changes or concerns # [phone]. [place]'s # [phone]

## 2020-05-08 ENCOUNTER — Telehealth: Payer: Self-pay

## 2020-05-08 ENCOUNTER — Ambulatory Visit: Payer: Medicare Other | Admitting: Internal Medicine

## 2020-05-08 ENCOUNTER — Encounter: Payer: Self-pay | Admitting: Internal Medicine

## 2020-05-08 VITALS — BP 130/64 | HR 72 | Ht 59.0 in | Wt 95.8 lb

## 2020-05-08 DIAGNOSIS — C182 Malignant neoplasm of ascending colon: Secondary | ICD-10-CM | POA: Insufficient documentation

## 2020-05-08 DIAGNOSIS — Z7901 Long term (current) use of anticoagulants: Secondary | ICD-10-CM | POA: Diagnosis not present

## 2020-05-08 DIAGNOSIS — K648 Other hemorrhoids: Secondary | ICD-10-CM | POA: Diagnosis not present

## 2020-05-08 NOTE — Telephone Encounter (Signed)
[facility] Pre-operative Risk Assessment     Request for surgical clearance:     Endoscopy Procedure  What type of surgery is being performed?     colonoscopy  When is this surgery scheduled?     05/23/2020  What type of clearance is required ?   Pharmacy  Are there any medications that need to be held prior to surgery and how long? Warfarin, 5 days  Practice name and name of physician performing surgery?      [facility]  What is your office phone and fax number?      Phone- [phone]  [phone]  Anesthesia type (None, local, MAC, general) ?       MAC

## 2020-05-08 NOTE — Telephone Encounter (Signed)
Since bridging is recommended, will the patient be set up a coumadin clinic visit prior to the surgery on 9/17 to discuss lovenox? I do not see one made at this time.

## 2020-05-08 NOTE — Progress Notes (Signed)
[name] 86 y.o. 1934/06/03 [id number]  Assessment & Plan:   Encounter Diagnoses  Name Primary?  . Cancer of ascending colon ([place]) Yes  . Bleeding internal hemorrhoids   . Warfarin anticoagulation     Schedule colonoscopy to reassess this site of cancer to see if it is completely resected. She is not a good surgical candidate. Hopeful for endoscopic removal of this area I will plan to tattoo the site. Note I removed this via cold snare so there probably will not be any burn scar. Will discuss further with oncology and surgical colleagues pending this colonoscopy.  Hold warfarin 5 days prior. She understands the risk benefits and indications and the rare risk of stroke off warfarin.  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 I do not think we have explored genetic testing in her and if we have not we probably should as a would benefit her offspring. Will review further when she returns.  I appreciate the opportunity to care for this patient. CC: [name], MD Stark Klein, MD   Subjective:   Chief Complaint: Malignant colon polyp  HPI Ms. Gioffre is here with one of her daughters, for follow-up of rectal bleeding that is due to hemorrhoids, this is much better. She was treated with topical therapy. She had a colonoscopy because of this bleeding which was not clearly hemorrhoidal in origin, and multiple colon polyps removed 1 of which was 25 mm from the ascending colon and had a focus of at least intramucosal carcinoma. There was no lymphovascular invasion. It is not clear that this polyp was completely removed based upon the size. She is otherwise doing okay. Allergies  Allergen Reactions  . Amiodarone Other (See Comments)     Thyroid and liver and kidney problems  . Adhesive [Tape] Other (See Comments)    Tears skin, Please use "paper" tape  .  Hydrocodone Other (See Comments)    HALLLUCINATIONS  . Other Other (See Comments)    NO BLOOD PRODUCTS -  Jehoval Witness  . Reglan [Metoclopramide] Hives, Itching and Rash  . Remeron [Mirtazapine] Other (See Comments)    Hallucinations and make pt loopy  . Codeine Nausea And Vomiting  . Morphine And Related Nausea And Vomiting  . Requip [Ropinirole Hcl] Other (See Comments)    Headache   . Zinc Nausea Only   Current Meds  Medication Sig  . acetaminophen (TYLENOL) 500 MG tablet Take 500-1,000 mg by mouth every 6 (six) hours as needed for mild pain or headache.   . ALPRAZolam (XANAX) 0.5 MG tablet Take 0.5 mg by mouth 2 (two) times daily as needed for anxiety.   . Alum & Mag Hydroxide-Simeth (MYLANTA PO) Take 20 mLs by mouth every evening.   [name] amoxicillin (AMOXIL) 500 MG tablet Take 4 tablets (2,000 mg total) by mouth as directed. 1 hour prior to dental work including cleanings  . Ascorbic Acid (VITAMIN C) 1000 MG tablet Take 1,000 mg by mouth daily.  . Cholecalciferol (VITAMIN D-3) 5000 units TABS Take 5,000 Units by mouth 3 (three) times daily.   . Cyanocobalamin 5000 MCG SUBL Place under the tongue.  [name] erythromycin ophthalmic ointment Place 1 application into both eyes at bedtime as needed (stye).   . famotidine (PEPCID) 40 MG tablet Take 1 tablet Daily for Indigestion & Heartburn  . ferrous sulfate 325 (65 FE) MG tablet Take 325 mg  by mouth daily in the afternoon.  . furosemide (LASIX) 20 MG tablet Take 1 tablet (20 mg total) by mouth daily.  [name] gabapentin (NEURONTIN) 300 MG capsule Take 300 mg by mouth 2 (two) times daily.  . hydrocortisone (ANUSOL-HC) 2.5 % rectal cream Place rectally 2 (two) times daily.  [name] levothyroxine (SYNTHROID) 100 MCG tablet TAKE 1/2 TABLET DAILY FOR THYROID.  [name] loperamide (IMODIUM A-D) 2 MG tablet Take up to 12 tablets /day as needed for  for Diarrhea  . loratadine (CLARITIN) 10 MG tablet Take 10 mg by mouth at bedtime.   . metoprolol succinate (TOPROL-XL)  50 MG 24 hr tablet Take 1 tablet (50 mg total) by mouth daily for 30 doses. Take with or immediately following a meal.  . nitroGLYCERIN (NITROSTAT) 0.4 MG SL tablet Dissolve 1 tablet under tongue every 5 minutes if needed for Angina  . ondansetron (ZOFRAN-ODT) 8 MG disintegrating tablet Dissolve 1 tablet under tongue every 6 to 8 hours  3 x /day if needed for Nausea or Vomitting  . potassium chloride (KLOR-CON) 10 MEQ tablet Take 1 tablet (10 mEq total) by mouth daily.  [name] Propylene Glycol (SYSTANE COMPLETE) 0.6 % SOLN Place 1 drop into both eyes 3 (three) times daily as needed (irritation/dry eyes.).  [name] simethicone (MYLICON) 80 MG chewable tablet Chew 80 mg by mouth every 6 (six) hours as needed for flatulence.  . sucralfate (CARAFATE) 1 g tablet Take 1 g by mouth 4 (four) times daily.  [name] tobramycin-dexamethasone (TOBRADEX) ophthalmic solution Place 1 drop into both eyes daily as needed (stye).   [name] triamcinolone cream (KENALOG) 0.1 % Apply 1 application topically 2 (two) times daily as needed (rash/skin irritation.).   [name] verapamil (CALAN-SR) 120 MG CR tablet TAKE 1 TABLET 2 X /DAY WITH A MEAL FOR BP & HEART RHYTHM  . warfarin (COUMADIN) 2 MG tablet TAKE 1 TO 2 TABLETS AS DIRECTED TO PREVENT BLOOD CLOTS   Past Medical History:  Diagnosis Date  . Adenocarcinoma of stomach ([place]) 11/11/2015   gastric mass on egd,   . Anal fissure   . Anemia    hx 3/17 iron infusion, on aranesp and injected B12 since 11/2015.   [name] Anxiety   . Aortic root dilatation ([place])   . Arthritis    oa  . Bladder cancer ([place])   . Blind left eye 2015   partial blind in left eye due to stroke   . 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 CAD (coronary artery disease)   . Cataracts, bilateral   . Chronic kidney disease    only has one kidney rt lft rem 03 ca     dr. Risa Grill, bladder cancer 05-04-2018  . Elevated LFTs    hepatic steatosis, none recent  . Endometrial cancer ([place])   . Gastroparesis   . GERD (gastroesophageal reflux disease)    . Hearing loss    wears hearing aids  . Heart murmur   . History of uterine cancer 1980s   treated with hysterectomy, external radiation and radiation seed implants.   [name] HTN (hypertension)   . Hyperlipemia   . Hypothyroidism   . Iron deficiency anemia due to chronic blood loss 11/24/2015  . Neuropathy    right leg  . Osteomyelitis ([place]) as child  . PAF (paroxysmal atrial fibrillation) ([place])   . Presence of permanent cardiac pacemaker   . Refusal of blood transfusions as patient is Jehovah's Witness   . Restless leg syndrome   . 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 Severe aortic stenosis   . Stroke [facility]) 2015   partial stroke left legally blind; left eye   . Tachycardia-bradycardia syndrome ([place])    s/p PPM by Dr Doreatha Lew (MDT) 07/03/10  . 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 Walker as ambulation aid   . Wears glasses   . Wears partial dentures    upper   Past Surgical History:  Procedure Laterality Date  . CARDIAC CATHETERIZATION  2008  . CARDIOVERSION N/A 12/05/2019   Procedure: CARDIOVERSION;  Surgeon: Elouise Munroe, MD;  Location: [facility] ENDOSCOPY;  Service: Cardiovascular;  Laterality: N/A;  . CHOLECYSTECTOMY  1970s  . COLONOSCOPY N/A 11/11/2015   Procedure: COLONOSCOPY;  Surgeon: [name], MD;  Location: [facility]OSCOPY;  Service: Endoscopy;  Laterality: N/A;  . CYSTOSCOPY W/ RETROGRADES Right 05/01/2018   Procedure: CYSTOSCOPY WITH RIGHT RETROGRADE PYELOGRAM;  Surgeon: Lucas Mallow, MD;  Location: [facility]S;  Service: Urology;  Laterality: Right;  . CYSTOSCOPY W/ RETROGRADES Right 07/21/2018   Procedure: CYSTOSCOPY WITH RIGHT RETROGRADE PYELOGRAM;  Surgeon: Alexis Frock, MD;  [facility];  Service: Urology;  Laterality: Right;  . ESOPHAGOGASTRODUODENOSCOPY N/A 11/11/2015   Procedure: ESOPHAGOGASTRODUODENOSCOPY (EGD);   Surgeon: [name], MD;  Location: [facility];  Service: Endoscopy;  Laterality: N/A;  . ESOPHAGOGASTRODUODENOSCOPY N/A 02/05/2016   Procedure: ESOPHAGOGASTRODUODENOSCOPY (EGD);  Surgeon: Ladene Artist, MD;  Location: [facility] ENDOSCOPY;  Service: Endoscopy;  Laterality: N/A;  . ESOPHAGOGASTRODUODENOSCOPY N/A 04/02/2016   Procedure: ESOPHAGOGASTRODUODENOSCOPY (EGD);  Surgeon: [name], MD;  Location: [facility];  Service: Endoscopy;  Laterality: N/A;  . ESOPHAGOGASTRODUODENOSCOPY (EGD) WITH PROPOFOL N/A 04/06/2017   Procedure: ESOPHAGOGASTRODUODENOSCOPY (EGD) WITH PROPOFOL;  Surgeon: [name], MD;  [facility];  Service: Endoscopy;  Laterality: N/A;  . EYE SURGERY Bilateral    remove cataracts  . GASTRECTOMY N/A 01/15/2016   Procedure: OPEN PARTIAL GASTRECTOMY;  Surgeon: Stark Klein, MD;  Location: [place];  Service: General;  Laterality: N/A;  . GASTRECTOMY Left 02/24/2016   Procedure: OPEN J TUBE;  Surgeon: Stark Klein, MD;  Location: [place];  Service: General;  Laterality: Left;  [name] GASTROJEJUNOSTOMY N/A 05/18/2016   Procedure: ROUX EN [place];  Surgeon: Stark Klein, MD;  Location: [place];  Service: General;  Laterality: N/A;  . I & D KNEE WITH POLY EXCHANGE Right 12/17/2013   Procedure: IRRIGATION AND DEBRIDEMEN RIGHT TOTAL  KNEE WITH POLY EXCHANGE;  Surgeon: Mauri Pole, MD;  [facility];  Service: Orthopedics;  Laterality: Right;  . IR GENERIC HISTORICAL  06/17/2016   IR RADIOLOGIST EVAL & MGMT 06/17/2016 Ascencion Dike, PA-C [facility] INTERV RAD  . LAPAROSCOPY N/A 01/15/2016   Procedure: LAPAROSCOPY DIAGNOSTIC;  Surgeon: Stark Klein, MD;  Location: [place];  Service: General;  Laterality: N/A;  . LYSIS OF ADHESION  05/18/2016   Procedure: LYSIS OF ADHESION;  Surgeon: Stark Klein, MD;  Location: [place];  Service: General;;  . NEPHRECTOMY Left    renal cell cancer  . PACEMAKER INSERTION  07/04/2011   MDT PPM implanted by Dr Doreatha Lew for tachy/brady;  managed by Dr Thompson Grayer   . PATELLAR TENDON REPAIR Right 03/06/2014   Procedure: AVULSION WITH PATELLA TENDON REPAIR;  Surgeon: Mauri Pole, MD;  [facility];  Service: Orthopedics;  Laterality: Right;  . PATENT DUCTUS  ARTERIOUS REPAIR  ~ 1949  . RIGHT/LEFT HEART CATH AND CORONARY ANGIOGRAPHY N/A 10/09/2019   Procedure: RIGHT/LEFT HEART CATH AND CORONARY ANGIOGRAPHY;  Surgeon: [name], [name], MD;  Location: [place] CV LAB;  Service: Cardiovascular;  Laterality: N/A;  . TEE WITHOUT CARDIOVERSION N/A 10/30/2019   Procedure: TRANSESOPHAGEAL ECHOCARDIOGRAM (TEE);  Surgeon: Burnell Blanks, MD;  Location: [place];  Service: Open Heart Surgery;  Laterality: N/A;  . TOTAL ABDOMINAL HYSTERECTOMY  85  . TOTAL HIP REVISION Right 2009   initial replacment surg  . TOTAL KNEE ARTHROPLASTY  02/07/2012   Procedure: TOTAL KNEE ARTHROPLASTY;  Surgeon: Mauri Pole, MD;  [facility];  Service: Orthopedics;  Laterality: Right;  . TOTAL KNEE REVISION Right 07/29/2014   Procedure: RIGHT KNEE REVISION OF PREVIOUS REPAIR EXTENSOR MECHANISM;  Surgeon: Mauri Pole, MD;  [facility];  Service: Orthopedics;  Laterality: Right;  . TRANSCATHETER AORTIC VALVE REPLACEMENT, CAROTID Left 10/30/2019   TRANSCATHETER AORTIC VALVE REPLACEMENT, LEFT CAROTID (Left Neck)   . TRANSCATHETER AORTIC VALVE REPLACEMENT, CAROTID Left 10/30/2019   Procedure: TRANSCATHETER AORTIC VALVE REPLACEMENT, LEFT CAROTID;  Surgeon: Burnell Blanks, MD;  Location: [place];  Service: Open Heart Surgery;  Laterality: Left;  . TRANSURETHRAL RESECTION OF BLADDER TUMOR N/A 07/21/2018   Procedure: TRANSURETHRAL RESECTION OF BLADDER TUMOR (TURBT);  Surgeon: Alexis Frock, MD;  [facility];  Service: Urology;  Laterality: N/A;  . TRANSURETHRAL RESECTION OF BLADDER TUMOR WITH MITOMYCIN-C N/A 05/01/2018   Procedure: TRANSURETHRAL RESECTION OF BLADDER TUMOR WITH POST OP INSTILLATION OF GEMCITABINE;  Surgeon: Lucas Mallow, MD;  [facility];  Service: Urology;  Laterality: N/A;  . WHIPPLE PROCEDURE N/A 05/18/2016   Procedure: EXPLORATORY LAPAROTOMY;  Surgeon: Stark Klein, MD;  Location: [place] [facility];  Service: General;  Laterality: N/A;   Social History   Social History Narrative   01/01/19 Lives in [place] w/dgtr, [name].  Continues to work.   family history includes Colon cancer (age of onset: 76) in her mother; Heart attack in her father; Heart disease in her father, maternal grandmother, and mother; Stomach cancer in her brother.   Review of Systems As per HPI  Objective:   Physical Exam BP 130/64   Pulse 72   Ht 4\' 11"  (1.499 m)   Wt 95 lb 12.8 oz (43.5 kg)   LMP  (LMP Unknown)   BMI 19.35 kg/m     Total time 21 minutes

## 2020-05-08 NOTE — Telephone Encounter (Signed)
Clinical pharmacist to review coumadin 

## 2020-05-08 NOTE — Telephone Encounter (Addendum)
Patient with diagnosis of A Fib on warfarin for anticoagulation.    Procedure: colonocopy Date of procedure: 05/23/20  CHADS2-VASc score of  8 (CHF, CAD, HTN, AGE x 2, stroke/tia x 2, female)  CrCl 28 mL/min Platelet count 175k  Per office protocol, patient can hold warfarin for 5 days prior to procedure.    Patient WILL need bridging with Lovenox (enoxaparin) around procedure.  If not bridging, patient should restart warfarin on the evening of procedure or day after, at discretion of procedure MD  For orthopedic procedures please be sure to resume therapeutic (not prophylactic) dosing.

## 2020-05-08 NOTE — Patient Instructions (Addendum)
You have been scheduled for a colonoscopy. Please follow written instructions given to you at your visit today.  Please pick up your prep supplies at the pharmacy within the next 1-3 days. If you use inhalers (even only as needed), please bring them with you on the day of your procedure.   You will be contaced by our office prior to your procedure for directions on holding your Coumadin/Warfarin.  If you do not hear from our office 1 week prior to your scheduled procedure, please call [phone] to discuss. Per your request I will contact your daughter Wess Botts at [phone] with the response from cardiology.  Hold your iron 3 days prior to your colonoscopy.   I appreciate the opportunity to care for you. Silvano Rusk, MD, [facility]

## 2020-05-09 ENCOUNTER — Ambulatory Visit (INDEPENDENT_AMBULATORY_CARE_PROVIDER_SITE_OTHER): Payer: Medicare Other | Admitting: Internal Medicine

## 2020-05-09 DIAGNOSIS — K219 Gastro-esophageal reflux disease without esophagitis: Secondary | ICD-10-CM | POA: Diagnosis not present

## 2020-05-09 DIAGNOSIS — Z950 Presence of cardiac pacemaker: Secondary | ICD-10-CM | POA: Diagnosis not present

## 2020-05-09 DIAGNOSIS — H6523 Chronic serous otitis media, bilateral: Secondary | ICD-10-CM | POA: Diagnosis not present

## 2020-05-09 DIAGNOSIS — N183 Chronic kidney disease, stage 3 unspecified: Secondary | ICD-10-CM | POA: Diagnosis not present

## 2020-05-09 DIAGNOSIS — I272 Pulmonary hypertension, unspecified: Secondary | ICD-10-CM | POA: Diagnosis not present

## 2020-05-09 DIAGNOSIS — G2581 Restless legs syndrome: Secondary | ICD-10-CM | POA: Diagnosis not present

## 2020-05-09 DIAGNOSIS — E039 Hypothyroidism, unspecified: Secondary | ICD-10-CM | POA: Diagnosis not present

## 2020-05-09 DIAGNOSIS — I5033 Acute on chronic diastolic (congestive) heart failure: Secondary | ICD-10-CM | POA: Diagnosis not present

## 2020-05-09 DIAGNOSIS — I130 Hypertensive heart and chronic kidney disease with heart failure and stage 1 through stage 4 chronic kidney disease, or unspecified chronic kidney disease: Secondary | ICD-10-CM | POA: Diagnosis not present

## 2020-05-09 DIAGNOSIS — I480 Paroxysmal atrial fibrillation: Secondary | ICD-10-CM | POA: Diagnosis not present

## 2020-05-09 DIAGNOSIS — K3184 Gastroparesis: Secondary | ICD-10-CM | POA: Diagnosis not present

## 2020-05-09 DIAGNOSIS — I495 Sick sinus syndrome: Secondary | ICD-10-CM | POA: Diagnosis not present

## 2020-05-09 DIAGNOSIS — Z952 Presence of prosthetic heart valve: Secondary | ICD-10-CM | POA: Diagnosis not present

## 2020-05-09 DIAGNOSIS — E875 Hyperkalemia: Secondary | ICD-10-CM | POA: Diagnosis not present

## 2020-05-09 DIAGNOSIS — I2510 Atherosclerotic heart disease of native coronary artery without angina pectoris: Secondary | ICD-10-CM | POA: Diagnosis not present

## 2020-05-09 DIAGNOSIS — Z7901 Long term (current) use of anticoagulants: Secondary | ICD-10-CM | POA: Diagnosis not present

## 2020-05-09 DIAGNOSIS — Z5181 Encounter for therapeutic drug level monitoring: Secondary | ICD-10-CM | POA: Diagnosis not present

## 2020-05-09 DIAGNOSIS — H5462 Unqualified visual loss, left eye, normal vision right eye: Secondary | ICD-10-CM | POA: Diagnosis not present

## 2020-05-09 DIAGNOSIS — E559 Vitamin D deficiency, unspecified: Secondary | ICD-10-CM | POA: Diagnosis not present

## 2020-05-09 DIAGNOSIS — Z85038 Personal history of other malignant neoplasm of large intestine: Secondary | ICD-10-CM | POA: Diagnosis not present

## 2020-05-09 DIAGNOSIS — I081 Rheumatic disorders of both mitral and tricuspid valves: Secondary | ICD-10-CM | POA: Diagnosis not present

## 2020-05-09 DIAGNOSIS — G5791 Unspecified mononeuropathy of right lower limb: Secondary | ICD-10-CM | POA: Diagnosis not present

## 2020-05-09 DIAGNOSIS — J301 Allergic rhinitis due to pollen: Secondary | ICD-10-CM | POA: Diagnosis not present

## 2020-05-09 DIAGNOSIS — Z85028 Personal history of other malignant neoplasm of stomach: Secondary | ICD-10-CM | POA: Diagnosis not present

## 2020-05-09 LAB — POCT INR: INR: 2.8 (ref 2.0–3.0)

## 2020-05-09 NOTE — Telephone Encounter (Signed)
I still would not bridge with Lovenox. While she is at increased risk off anticoagulation she is also a high bleeding risk ( and Jehovah's witness making management of any bleeding difficult )  Estes Lehner [name] MD, [facility]

## 2020-05-09 NOTE — Telephone Encounter (Signed)
Beverlee Nims Page (daughter) informed and she verbalized understanding.

## 2020-05-09 NOTE — Telephone Encounter (Signed)
Patient has history of rectal bleeding and was therefore not given Lovenox in the past but remains at high risk off anticoagulation.    Currently has home health services.   Per GI note: HPI [name] is here with one of her daughters, for follow-up of rectal bleeding that is due to hemorrhoids, this is much better. She was treated with topical therapy. She had a colonoscopy because of this bleeding which was not clearly hemorrhoidal in origin, and multiple colon polyps removed 1 of which was 25 mm from the ascending colon and had a focus of at least intramucosal carcinoma. There was no lymphovascular invasion. It is not clear that this polyp was completely removed based upon the size. She is otherwise doing okay.  Dr. [name], would you recommend we bridge this patient at this time?

## 2020-05-09 NOTE — Telephone Encounter (Signed)
Dr. [name] does not recommend Lovenox bridging.  Patient to hold 5 doses of warfarin before procedure.

## 2020-05-09 NOTE — Patient Instructions (Signed)
Description   Spoke with Lawerance Bach, [facility] RN while in pt's home, advised to have pt continue on same dosage 2mg  daily. Recheck INR in 1 week.  Call us with any medication changes or concerns # [phone]. [place]'s # [phone]

## 2020-05-15 ENCOUNTER — Ambulatory Visit: Payer: Medicare Other | Admitting: Adult Health

## 2020-05-16 ENCOUNTER — Other Ambulatory Visit: Payer: Self-pay | Admitting: Internal Medicine

## 2020-05-16 ENCOUNTER — Ambulatory Visit (INDEPENDENT_AMBULATORY_CARE_PROVIDER_SITE_OTHER): Payer: Medicare Other | Admitting: Pharmacist

## 2020-05-16 DIAGNOSIS — I480 Paroxysmal atrial fibrillation: Secondary | ICD-10-CM | POA: Diagnosis not present

## 2020-05-16 DIAGNOSIS — G2581 Restless legs syndrome: Secondary | ICD-10-CM | POA: Diagnosis not present

## 2020-05-16 DIAGNOSIS — Z952 Presence of prosthetic heart valve: Secondary | ICD-10-CM | POA: Diagnosis not present

## 2020-05-16 DIAGNOSIS — Z85038 Personal history of other malignant neoplasm of large intestine: Secondary | ICD-10-CM | POA: Diagnosis not present

## 2020-05-16 DIAGNOSIS — Z8673 Personal history of transient ischemic attack (TIA), and cerebral infarction without residual deficits: Secondary | ICD-10-CM | POA: Diagnosis not present

## 2020-05-16 DIAGNOSIS — E875 Hyperkalemia: Secondary | ICD-10-CM | POA: Diagnosis not present

## 2020-05-16 DIAGNOSIS — E559 Vitamin D deficiency, unspecified: Secondary | ICD-10-CM | POA: Diagnosis not present

## 2020-05-16 DIAGNOSIS — H6523 Chronic serous otitis media, bilateral: Secondary | ICD-10-CM | POA: Diagnosis not present

## 2020-05-16 DIAGNOSIS — Z85028 Personal history of other malignant neoplasm of stomach: Secondary | ICD-10-CM | POA: Diagnosis not present

## 2020-05-16 DIAGNOSIS — Z5181 Encounter for therapeutic drug level monitoring: Secondary | ICD-10-CM | POA: Diagnosis not present

## 2020-05-16 DIAGNOSIS — K219 Gastro-esophageal reflux disease without esophagitis: Secondary | ICD-10-CM | POA: Diagnosis not present

## 2020-05-16 DIAGNOSIS — I130 Hypertensive heart and chronic kidney disease with heart failure and stage 1 through stage 4 chronic kidney disease, or unspecified chronic kidney disease: Secondary | ICD-10-CM | POA: Diagnosis not present

## 2020-05-16 DIAGNOSIS — K3184 Gastroparesis: Secondary | ICD-10-CM | POA: Diagnosis not present

## 2020-05-16 DIAGNOSIS — Z7901 Long term (current) use of anticoagulants: Secondary | ICD-10-CM

## 2020-05-16 DIAGNOSIS — N183 Chronic kidney disease, stage 3 unspecified: Secondary | ICD-10-CM | POA: Diagnosis not present

## 2020-05-16 DIAGNOSIS — I272 Pulmonary hypertension, unspecified: Secondary | ICD-10-CM | POA: Diagnosis not present

## 2020-05-16 DIAGNOSIS — G5791 Unspecified mononeuropathy of right lower limb: Secondary | ICD-10-CM | POA: Diagnosis not present

## 2020-05-16 DIAGNOSIS — Z950 Presence of cardiac pacemaker: Secondary | ICD-10-CM | POA: Diagnosis not present

## 2020-05-16 DIAGNOSIS — I4891 Unspecified atrial fibrillation: Secondary | ICD-10-CM | POA: Diagnosis not present

## 2020-05-16 DIAGNOSIS — E039 Hypothyroidism, unspecified: Secondary | ICD-10-CM | POA: Diagnosis not present

## 2020-05-16 DIAGNOSIS — I495 Sick sinus syndrome: Secondary | ICD-10-CM | POA: Diagnosis not present

## 2020-05-16 DIAGNOSIS — I2510 Atherosclerotic heart disease of native coronary artery without angina pectoris: Secondary | ICD-10-CM | POA: Diagnosis not present

## 2020-05-16 DIAGNOSIS — I5033 Acute on chronic diastolic (congestive) heart failure: Secondary | ICD-10-CM | POA: Diagnosis not present

## 2020-05-16 DIAGNOSIS — I081 Rheumatic disorders of both mitral and tricuspid valves: Secondary | ICD-10-CM | POA: Diagnosis not present

## 2020-05-16 DIAGNOSIS — H5462 Unqualified visual loss, left eye, normal vision right eye: Secondary | ICD-10-CM | POA: Diagnosis not present

## 2020-05-16 DIAGNOSIS — J301 Allergic rhinitis due to pollen: Secondary | ICD-10-CM | POA: Diagnosis not present

## 2020-05-16 LAB — POCT INR: INR: 2.7 (ref 2.0–3.0)

## 2020-05-21 DIAGNOSIS — G5791 Unspecified mononeuropathy of right lower limb: Secondary | ICD-10-CM | POA: Diagnosis not present

## 2020-05-21 DIAGNOSIS — I495 Sick sinus syndrome: Secondary | ICD-10-CM | POA: Diagnosis not present

## 2020-05-21 DIAGNOSIS — N183 Chronic kidney disease, stage 3 unspecified: Secondary | ICD-10-CM | POA: Diagnosis not present

## 2020-05-21 DIAGNOSIS — E559 Vitamin D deficiency, unspecified: Secondary | ICD-10-CM | POA: Diagnosis not present

## 2020-05-21 DIAGNOSIS — Z952 Presence of prosthetic heart valve: Secondary | ICD-10-CM | POA: Diagnosis not present

## 2020-05-21 DIAGNOSIS — I272 Pulmonary hypertension, unspecified: Secondary | ICD-10-CM | POA: Diagnosis not present

## 2020-05-21 DIAGNOSIS — I130 Hypertensive heart and chronic kidney disease with heart failure and stage 1 through stage 4 chronic kidney disease, or unspecified chronic kidney disease: Secondary | ICD-10-CM | POA: Diagnosis not present

## 2020-05-21 DIAGNOSIS — Z950 Presence of cardiac pacemaker: Secondary | ICD-10-CM | POA: Diagnosis not present

## 2020-05-21 DIAGNOSIS — Z85038 Personal history of other malignant neoplasm of large intestine: Secondary | ICD-10-CM | POA: Diagnosis not present

## 2020-05-21 DIAGNOSIS — I2510 Atherosclerotic heart disease of native coronary artery without angina pectoris: Secondary | ICD-10-CM | POA: Diagnosis not present

## 2020-05-21 DIAGNOSIS — E039 Hypothyroidism, unspecified: Secondary | ICD-10-CM | POA: Diagnosis not present

## 2020-05-21 DIAGNOSIS — I480 Paroxysmal atrial fibrillation: Secondary | ICD-10-CM | POA: Diagnosis not present

## 2020-05-21 DIAGNOSIS — Z7901 Long term (current) use of anticoagulants: Secondary | ICD-10-CM | POA: Diagnosis not present

## 2020-05-21 DIAGNOSIS — G2581 Restless legs syndrome: Secondary | ICD-10-CM | POA: Diagnosis not present

## 2020-05-21 DIAGNOSIS — J301 Allergic rhinitis due to pollen: Secondary | ICD-10-CM | POA: Diagnosis not present

## 2020-05-21 DIAGNOSIS — K3184 Gastroparesis: Secondary | ICD-10-CM | POA: Diagnosis not present

## 2020-05-21 DIAGNOSIS — H6523 Chronic serous otitis media, bilateral: Secondary | ICD-10-CM | POA: Diagnosis not present

## 2020-05-21 DIAGNOSIS — Z5181 Encounter for therapeutic drug level monitoring: Secondary | ICD-10-CM | POA: Diagnosis not present

## 2020-05-21 DIAGNOSIS — E875 Hyperkalemia: Secondary | ICD-10-CM | POA: Diagnosis not present

## 2020-05-21 DIAGNOSIS — I5033 Acute on chronic diastolic (congestive) heart failure: Secondary | ICD-10-CM | POA: Diagnosis not present

## 2020-05-21 DIAGNOSIS — K219 Gastro-esophageal reflux disease without esophagitis: Secondary | ICD-10-CM | POA: Diagnosis not present

## 2020-05-21 DIAGNOSIS — H5462 Unqualified visual loss, left eye, normal vision right eye: Secondary | ICD-10-CM | POA: Diagnosis not present

## 2020-05-21 DIAGNOSIS — I081 Rheumatic disorders of both mitral and tricuspid valves: Secondary | ICD-10-CM | POA: Diagnosis not present

## 2020-05-21 DIAGNOSIS — Z85028 Personal history of other malignant neoplasm of stomach: Secondary | ICD-10-CM | POA: Diagnosis not present

## 2020-05-23 ENCOUNTER — Ambulatory Visit (AMBULATORY_SURGERY_CENTER): Payer: Medicare Other | Admitting: Gastroenterology

## 2020-05-23 ENCOUNTER — Other Ambulatory Visit: Payer: Self-pay

## 2020-05-23 ENCOUNTER — Encounter: Payer: Self-pay | Admitting: Gastroenterology

## 2020-05-23 VITALS — BP 138/64 | HR 79 | Temp 98.0°F | Resp 11 | Ht 59.0 in | Wt 95.0 lb

## 2020-05-23 DIAGNOSIS — D128 Benign neoplasm of rectum: Secondary | ICD-10-CM | POA: Diagnosis not present

## 2020-05-23 DIAGNOSIS — D120 Benign neoplasm of cecum: Secondary | ICD-10-CM | POA: Diagnosis not present

## 2020-05-23 DIAGNOSIS — Z85038 Personal history of other malignant neoplasm of large intestine: Secondary | ICD-10-CM | POA: Diagnosis not present

## 2020-05-23 DIAGNOSIS — C182 Malignant neoplasm of ascending colon: Secondary | ICD-10-CM | POA: Diagnosis not present

## 2020-05-23 DIAGNOSIS — G2581 Restless legs syndrome: Secondary | ICD-10-CM | POA: Diagnosis not present

## 2020-05-23 DIAGNOSIS — D124 Benign neoplasm of descending colon: Secondary | ICD-10-CM

## 2020-05-23 DIAGNOSIS — D122 Benign neoplasm of ascending colon: Secondary | ICD-10-CM

## 2020-05-23 MED ORDER — SODIUM CHLORIDE 0.9 % IV SOLN: 500.0000 mL | Freq: Once | INTRAVENOUS | Status: DC

## 2020-05-23 NOTE — Progress Notes (Signed)
[place] vitals and SM IV.

## 2020-05-23 NOTE — Patient Instructions (Signed)
HANDOUTS PROVIDED ON: POLYPS, DIVERTICULOSIS, & HEMORRHOIDS  The polyps removed today have been sent for pathology.  The results can take 1-3 weeks to receive.  When your next colonoscopy should occur will be based on the pathology results.    You may resume your previous diet and medication schedule EXCEPT COUMADIN which can be resumed on 05/25/20.  Please avoid asprin, ibuprofen, naproxen, or any other NSAIDs for 2 weeks.  Thank you for allowing [place] to care for you today!!!   YOU HAD AN ENDOSCOPIC PROCEDURE TODAY AT [place]:   Refer to the procedure report that was given to you for any specific questions about what was found during the examination.  If the procedure report does not answer your questions, please call your gastroenterologist to clarify.  If you requested that your care partner not be given the details of your procedure findings, then the procedure report has been included in a sealed envelope for you to review at your convenience later.  YOU SHOULD EXPECT: Some feelings of bloating in the abdomen. Passage of more gas than usual.  Walking can help get rid of the air that was put into your GI tract during the procedure and reduce the bloating. If you had a lower endoscopy (such as a colonoscopy or flexible sigmoidoscopy) you may notice spotting of blood in your stool or on the toilet paper. If you underwent a bowel prep for your procedure, you may not have a normal bowel movement for a few days.  Please Note:  You might notice some irritation and congestion in your nose or some drainage.  This is from the oxygen used during your procedure.  There is no need for concern and it should clear up in a day or so.  SYMPTOMS TO REPORT IMMEDIATELY:   Following lower endoscopy (colonoscopy or flexible sigmoidoscopy):  Excessive amounts of blood in the stool  Significant tenderness or worsening of abdominal pains  Swelling of the abdomen that is new, acute  Fever of 100F  or higher   For urgent or emergent issues, a gastroenterologist can be reached at any hour by calling [phone]. Do not use MyChart messaging for urgent concerns.    DIET:  We do recommend a small meal at first, but then you may proceed to your regular diet.  Drink plenty of fluids but you should avoid alcoholic beverages for 24 hours.  ACTIVITY:  You should plan to take it easy for the rest of today and you should NOT DRIVE or use heavy machinery until tomorrow (because of the sedation medicines used during the test).    FOLLOW UP: Our staff will call the number listed on your records 48-72 hours following your procedure to check on you and address any questions or concerns that you may have regarding the information given to you following your procedure. If we do not reach you, we will leave a message.  We will attempt to reach you two times.  During this call, we will ask if you have developed any symptoms of COVID 19. If you develop any symptoms (ie: fever, flu-like symptoms, shortness of breath, cough etc.) before then, please call [phone].  If you test positive for Covid 19 in the 2 weeks post procedure, please call and report this information to [place].    If any biopsies were taken you will be contacted by phone or by letter within the next 1-3 weeks.  Please call us at [phone] if you have  not heard about the biopsies in 3 weeks.    SIGNATURES/CONFIDENTIALITY: You and/or your care partner have signed paperwork which will be entered into your electronic medical record.  These signatures attest to the fact that that the information above on your After Visit Summary has been reviewed and is understood.  Full responsibility of the confidentiality of this discharge information lies with you and/or your care-partner.

## 2020-05-23 NOTE — Progress Notes (Signed)
Dr Fuller Plan aware of ephedrine

## 2020-05-23 NOTE — Progress Notes (Signed)
Called to room to assist during endoscopic procedure.  Patient ID and intended procedure confirmed with present staff. Received instructions for my participation in the procedure from the performing physician.  

## 2020-05-23 NOTE — Op Note (Signed)
[facility] Patient Name: [name] Procedure Date: 05/23/2020 9:27 AM MRN: [id number] Endoscopist: Ladene Artist , MD Age: 86 Referring MD:  Date of Birth: Sep 14, 1933 Gender: Female Account #: [id number] Procedure:                Colonoscopy Indications:              Surveillance: History of removal of ascending colon                            tubular adenoma with focal adenocarcinoma on last                            colonoscopy (< 3 yrs) Medicines:                Monitored Anesthesia Care Procedure:                Pre-Anesthesia Assessment:                           - Prior to the procedure, a History and Physical                            was performed, and patient medications and                            allergies were reviewed. The patient's tolerance of                            previous anesthesia was also reviewed. The risks                            and benefits of the procedure and the sedation                            options and risks were discussed with the patient.                            All questions were answered, and informed consent                            was obtained. Prior Anticoagulants: The patient has                            taken Coumadin (warfarin), last dose was 5 days                            prior to procedure. ASA Grade Assessment: III - A                            patient with severe systemic disease. After                            reviewing the risks and benefits, the patient was  deemed in satisfactory condition to undergo the                            procedure.                           After obtaining informed consent, the colonoscope                            was passed under direct vision. Throughout the                            procedure, the patient's blood pressure, pulse, and                            oxygen saturations were monitored continuously. The                             Colonoscope was introduced through the anus and                            advanced to the the cecum, identified by                            appendiceal orifice and ileocecal valve. The                            ileocecal valve, appendiceal orifice, and rectum                            were photographed. The quality of the bowel                            preparation was good. The colonoscopy was performed                            without difficulty. The patient tolerated the                            procedure well. Scope In: 9:31:08 AM Scope Out: 10:01:40 AM Scope Withdrawal Time: 0 hours 27 minutes 23 seconds  Total Procedure Duration: 0 hours 30 minutes 32 seconds  Findings:                 The perianal and digital rectal examinations were                            normal.                           A 10 mm polyp was found in the ascending colon. The                            polyp was sessile. Surrounding erythema c/w site of  prior polypectomy. The polyp was removed with a                            piecemeal technique using a hot snare. Resection                            and retrieval were complete. Area just distal to                            the site was tattooed with injections of 2 mL of                            Spot (carbon black). 2 separate locations with 1 mL                            in each location.                           Six sessile polyps were found in the rectum (1),                            descending colon (2), ascending colon (1) and cecum                            (2). The polyps were 6 to 8 mm in size. These                            polyps were removed with a cold snare. Resection                            and retrieval were complete.                           A few small-mouthed diverticula were found in the                            sigmoid colon.                           A 12 mm polyp was found in the  rectum. The polyp                            was sessile. The polyp was removed with a hot                            snare. Resection and retrieval were complete.                           Internal hemorrhoids were found during                            retroflexion. The hemorrhoids were small and Grade  I (internal hemorrhoids that do not prolapse).                           The exam was otherwise without abnormality on                            direct and retroflexion views. Complications:            No immediate complications. Estimated blood loss:                            None. Estimated Blood Loss:     Estimated blood loss: none. Impression:               - One 10 mm polyp in the ascending colon, removed                            piecemeal using a hot snare. Site of prior                            polypectomy. Resected and retrieved. Tattooed.                           - Six 6 to 8 mm polyps in the rectum, in the                            descending colon, in the ascending colon and in the                            cecum, removed with a cold snare. Resected and                            retrieved.                           - Diverticulosis in the sigmoid colon.                           - One 12 mm polyp in the rectum, removed with a hot                            snare. Resected and retrieved.                           - Internal hemorrhoids.                           - The examination was otherwise normal on direct                            and retroflexion views. Recommendation:           - Repeat colonoscopy date to be determined after                            pending pathology results  are reviewed for                            surveillance after piecemeal polypectomy, likely 6                            months with Dr. Carlean Purl.                           - Resume Coumadin (warfarin) in 2 days at prior                            dose. Refer  to managing physician for further                            adjustment of therapy.                           - Patient has a contact number available for                            emergencies. The signs and symptoms of potential                            delayed complications were discussed with the                            patient. Return to normal activities tomorrow.                            Written discharge instructions were provided to the                            patient.                           - Resume previous diet.                           - Continue present medications.                           - Await pathology results.                           - No aspirin, ibuprofen, naproxen, or other                            non-steroidal anti-inflammatory drugs for 2 weeks                            after polyp removal. Ladene Artist, MD 05/23/2020 10:14:21 AM This report has been signed electronically.

## 2020-05-23 NOTE — Progress Notes (Signed)
Report to PACU, RN, vss, BBS= Clear.  

## 2020-05-26 DIAGNOSIS — C672 Malignant neoplasm of lateral wall of bladder: Secondary | ICD-10-CM | POA: Diagnosis not present

## 2020-05-27 ENCOUNTER — Telehealth: Payer: Self-pay

## 2020-05-27 ENCOUNTER — Telehealth: Payer: Self-pay | Admitting: *Deleted

## 2020-05-27 NOTE — Telephone Encounter (Signed)
°  Follow up Call-  Call back number 05/23/2020 04/11/2020  Post procedure Call Back phone  # [name] daughter [phone]  Permission to leave phone message Yes Yes  Some recent data might be hidden     Patient questions:  Do you have a fever, pain , or abdominal swelling? No. Pain Score  0 *  Have you tolerated food without any problems? Yes.    Have you been able to return to your normal activities? Yes.    Do you have any questions about your discharge instructions: Diet   No. Medications  No. Follow up visit  No.  Do you have questions or concerns about your Care? No.  Actions: * If pain score is 4 or above: No action needed, pain <4.  1. Have you developed a fever since your procedure? no  2.   Have you had an respiratory symptoms (SOB or cough) since your procedure? no  3.   Have you tested positive for COVID 19 since your procedure no  4.   Have you had any family members/close contacts diagnosed with the COVID 19 since your procedure?  no   If yes to any of these questions please route to Joylene John, RN and Joella Prince, RN

## 2020-05-27 NOTE — Telephone Encounter (Signed)
1st follow up call made.  NALM 

## 2020-05-30 ENCOUNTER — Telehealth: Payer: Self-pay

## 2020-05-30 ENCOUNTER — Ambulatory Visit (INDEPENDENT_AMBULATORY_CARE_PROVIDER_SITE_OTHER): Payer: Medicare Other | Admitting: Cardiology

## 2020-05-30 DIAGNOSIS — E875 Hyperkalemia: Secondary | ICD-10-CM | POA: Diagnosis not present

## 2020-05-30 DIAGNOSIS — E039 Hypothyroidism, unspecified: Secondary | ICD-10-CM | POA: Diagnosis not present

## 2020-05-30 DIAGNOSIS — E559 Vitamin D deficiency, unspecified: Secondary | ICD-10-CM | POA: Diagnosis not present

## 2020-05-30 DIAGNOSIS — K3184 Gastroparesis: Secondary | ICD-10-CM | POA: Diagnosis not present

## 2020-05-30 DIAGNOSIS — I495 Sick sinus syndrome: Secondary | ICD-10-CM | POA: Diagnosis not present

## 2020-05-30 DIAGNOSIS — H6523 Chronic serous otitis media, bilateral: Secondary | ICD-10-CM | POA: Diagnosis not present

## 2020-05-30 DIAGNOSIS — I5033 Acute on chronic diastolic (congestive) heart failure: Secondary | ICD-10-CM | POA: Diagnosis not present

## 2020-05-30 DIAGNOSIS — I081 Rheumatic disorders of both mitral and tricuspid valves: Secondary | ICD-10-CM | POA: Diagnosis not present

## 2020-05-30 DIAGNOSIS — N183 Chronic kidney disease, stage 3 unspecified: Secondary | ICD-10-CM | POA: Diagnosis not present

## 2020-05-30 DIAGNOSIS — Z5181 Encounter for therapeutic drug level monitoring: Secondary | ICD-10-CM | POA: Diagnosis not present

## 2020-05-30 DIAGNOSIS — Z85038 Personal history of other malignant neoplasm of large intestine: Secondary | ICD-10-CM | POA: Diagnosis not present

## 2020-05-30 DIAGNOSIS — I272 Pulmonary hypertension, unspecified: Secondary | ICD-10-CM | POA: Diagnosis not present

## 2020-05-30 DIAGNOSIS — Z85028 Personal history of other malignant neoplasm of stomach: Secondary | ICD-10-CM | POA: Diagnosis not present

## 2020-05-30 DIAGNOSIS — Z7901 Long term (current) use of anticoagulants: Secondary | ICD-10-CM | POA: Diagnosis not present

## 2020-05-30 DIAGNOSIS — J301 Allergic rhinitis due to pollen: Secondary | ICD-10-CM | POA: Diagnosis not present

## 2020-05-30 DIAGNOSIS — Z950 Presence of cardiac pacemaker: Secondary | ICD-10-CM | POA: Diagnosis not present

## 2020-05-30 DIAGNOSIS — G2581 Restless legs syndrome: Secondary | ICD-10-CM | POA: Diagnosis not present

## 2020-05-30 DIAGNOSIS — I2510 Atherosclerotic heart disease of native coronary artery without angina pectoris: Secondary | ICD-10-CM | POA: Diagnosis not present

## 2020-05-30 DIAGNOSIS — Z952 Presence of prosthetic heart valve: Secondary | ICD-10-CM | POA: Diagnosis not present

## 2020-05-30 DIAGNOSIS — I480 Paroxysmal atrial fibrillation: Secondary | ICD-10-CM | POA: Diagnosis not present

## 2020-05-30 DIAGNOSIS — H5462 Unqualified visual loss, left eye, normal vision right eye: Secondary | ICD-10-CM | POA: Diagnosis not present

## 2020-05-30 DIAGNOSIS — I130 Hypertensive heart and chronic kidney disease with heart failure and stage 1 through stage 4 chronic kidney disease, or unspecified chronic kidney disease: Secondary | ICD-10-CM | POA: Diagnosis not present

## 2020-05-30 DIAGNOSIS — K219 Gastro-esophageal reflux disease without esophagitis: Secondary | ICD-10-CM | POA: Diagnosis not present

## 2020-05-30 DIAGNOSIS — G5791 Unspecified mononeuropathy of right lower limb: Secondary | ICD-10-CM | POA: Diagnosis not present

## 2020-05-30 LAB — POCT INR: INR: 1.6 — AB (ref 2.0–3.0)

## 2020-05-30 NOTE — Patient Instructions (Addendum)
Description   Spoke with Lawerance Bach, [facility] RN while in pt's home,  advised for pt to take 1.5 tablets of warfarin today and tomorrow and then continue to take 1 tablet daily.Recheck INR in 1 week- by home health.   Call us with any medication changes or concerns # [phone]. [place] # [phone].

## 2020-05-30 NOTE — Telephone Encounter (Signed)
Received note from another nurse stating family called and has ordered a new remote transmitting box. When it arrives they will send a transmission.

## 2020-06-02 ENCOUNTER — Encounter: Payer: Self-pay | Admitting: Gastroenterology

## 2020-06-06 ENCOUNTER — Ambulatory Visit (INDEPENDENT_AMBULATORY_CARE_PROVIDER_SITE_OTHER): Payer: Medicare Other | Admitting: *Deleted

## 2020-06-06 DIAGNOSIS — Z7901 Long term (current) use of anticoagulants: Secondary | ICD-10-CM

## 2020-06-06 DIAGNOSIS — I4891 Unspecified atrial fibrillation: Secondary | ICD-10-CM

## 2020-06-06 DIAGNOSIS — N183 Chronic kidney disease, stage 3 unspecified: Secondary | ICD-10-CM | POA: Diagnosis not present

## 2020-06-06 DIAGNOSIS — J301 Allergic rhinitis due to pollen: Secondary | ICD-10-CM | POA: Diagnosis not present

## 2020-06-06 DIAGNOSIS — K219 Gastro-esophageal reflux disease without esophagitis: Secondary | ICD-10-CM | POA: Diagnosis not present

## 2020-06-06 DIAGNOSIS — Z5181 Encounter for therapeutic drug level monitoring: Secondary | ICD-10-CM | POA: Diagnosis not present

## 2020-06-06 DIAGNOSIS — Z952 Presence of prosthetic heart valve: Secondary | ICD-10-CM | POA: Diagnosis not present

## 2020-06-06 DIAGNOSIS — H5462 Unqualified visual loss, left eye, normal vision right eye: Secondary | ICD-10-CM | POA: Diagnosis not present

## 2020-06-06 DIAGNOSIS — I5033 Acute on chronic diastolic (congestive) heart failure: Secondary | ICD-10-CM | POA: Diagnosis not present

## 2020-06-06 DIAGNOSIS — Z950 Presence of cardiac pacemaker: Secondary | ICD-10-CM | POA: Diagnosis not present

## 2020-06-06 DIAGNOSIS — Z85028 Personal history of other malignant neoplasm of stomach: Secondary | ICD-10-CM | POA: Diagnosis not present

## 2020-06-06 DIAGNOSIS — E875 Hyperkalemia: Secondary | ICD-10-CM | POA: Diagnosis not present

## 2020-06-06 DIAGNOSIS — I130 Hypertensive heart and chronic kidney disease with heart failure and stage 1 through stage 4 chronic kidney disease, or unspecified chronic kidney disease: Secondary | ICD-10-CM | POA: Diagnosis not present

## 2020-06-06 DIAGNOSIS — I495 Sick sinus syndrome: Secondary | ICD-10-CM | POA: Diagnosis not present

## 2020-06-06 DIAGNOSIS — E559 Vitamin D deficiency, unspecified: Secondary | ICD-10-CM | POA: Diagnosis not present

## 2020-06-06 DIAGNOSIS — G5791 Unspecified mononeuropathy of right lower limb: Secondary | ICD-10-CM | POA: Diagnosis not present

## 2020-06-06 DIAGNOSIS — I480 Paroxysmal atrial fibrillation: Secondary | ICD-10-CM | POA: Diagnosis not present

## 2020-06-06 DIAGNOSIS — G2581 Restless legs syndrome: Secondary | ICD-10-CM | POA: Diagnosis not present

## 2020-06-06 DIAGNOSIS — K3184 Gastroparesis: Secondary | ICD-10-CM | POA: Diagnosis not present

## 2020-06-06 DIAGNOSIS — I272 Pulmonary hypertension, unspecified: Secondary | ICD-10-CM | POA: Diagnosis not present

## 2020-06-06 DIAGNOSIS — Z85038 Personal history of other malignant neoplasm of large intestine: Secondary | ICD-10-CM | POA: Diagnosis not present

## 2020-06-06 DIAGNOSIS — H6523 Chronic serous otitis media, bilateral: Secondary | ICD-10-CM | POA: Diagnosis not present

## 2020-06-06 DIAGNOSIS — I2510 Atherosclerotic heart disease of native coronary artery without angina pectoris: Secondary | ICD-10-CM | POA: Diagnosis not present

## 2020-06-06 DIAGNOSIS — I081 Rheumatic disorders of both mitral and tricuspid valves: Secondary | ICD-10-CM | POA: Diagnosis not present

## 2020-06-06 DIAGNOSIS — E039 Hypothyroidism, unspecified: Secondary | ICD-10-CM | POA: Diagnosis not present

## 2020-06-06 LAB — POCT INR: INR: 3.1 — AB (ref 2.0–3.0)

## 2020-06-06 NOTE — Patient Instructions (Signed)
Description   Spoke with [place], [facility] RN while in pt's home, advised for pt to take 1/2 tablet today then continue to take 1 tablet daily. Recheck INR in 1 week- by Home Health.   Call us with any medication changes or concerns # [phone]. [place] # [phone].

## 2020-06-09 ENCOUNTER — Telehealth: Payer: Self-pay | Admitting: Gastroenterology

## 2020-06-09 ENCOUNTER — Ambulatory Visit (INDEPENDENT_AMBULATORY_CARE_PROVIDER_SITE_OTHER): Payer: Medicare Other

## 2020-06-09 DIAGNOSIS — I495 Sick sinus syndrome: Secondary | ICD-10-CM

## 2020-06-09 NOTE — Telephone Encounter (Signed)
All of the patient's daughters questions were answered.  She will call back for any additional questions or concerns.

## 2020-06-10 ENCOUNTER — Other Ambulatory Visit: Payer: Self-pay

## 2020-06-10 ENCOUNTER — Encounter (HOSPITAL_COMMUNITY): Payer: Self-pay | Admitting: Physician Assistant

## 2020-06-10 ENCOUNTER — Telehealth: Payer: Self-pay

## 2020-06-10 ENCOUNTER — Telehealth: Payer: Self-pay | Admitting: *Deleted

## 2020-06-10 ENCOUNTER — Ambulatory Visit (HOSPITAL_COMMUNITY)
Admission: RE | Admit: 2020-06-10 | Discharge: 2020-06-10 | Disposition: A | Discharge status: Home / Self Care | Payer: Medicare Other | Source: Ambulatory Visit | Attending: Physician Assistant | Admitting: Physician Assistant

## 2020-06-10 VITALS — BP 150/70 | HR 108 | Ht 59.0 in | Wt 97.4 lb

## 2020-06-10 DIAGNOSIS — N183 Chronic kidney disease, stage 3 unspecified: Secondary | ICD-10-CM | POA: Diagnosis not present

## 2020-06-10 DIAGNOSIS — H3412 Central retinal artery occlusion, left eye: Secondary | ICD-10-CM | POA: Insufficient documentation

## 2020-06-10 DIAGNOSIS — I495 Sick sinus syndrome: Secondary | ICD-10-CM | POA: Diagnosis not present

## 2020-06-10 DIAGNOSIS — I2510 Atherosclerotic heart disease of native coronary artery without angina pectoris: Secondary | ICD-10-CM | POA: Insufficient documentation

## 2020-06-10 DIAGNOSIS — Z8673 Personal history of transient ischemic attack (TIA), and cerebral infarction without residual deficits: Secondary | ICD-10-CM | POA: Diagnosis not present

## 2020-06-10 DIAGNOSIS — D500 Iron deficiency anemia secondary to blood loss (chronic): Secondary | ICD-10-CM | POA: Insufficient documentation

## 2020-06-10 DIAGNOSIS — Z79899 Other long term (current) drug therapy: Secondary | ICD-10-CM | POA: Diagnosis not present

## 2020-06-10 DIAGNOSIS — E785 Hyperlipidemia, unspecified: Secondary | ICD-10-CM | POA: Diagnosis not present

## 2020-06-10 DIAGNOSIS — I130 Hypertensive heart and chronic kidney disease with heart failure and stage 1 through stage 4 chronic kidney disease, or unspecified chronic kidney disease: Secondary | ICD-10-CM | POA: Insufficient documentation

## 2020-06-10 DIAGNOSIS — H5462 Unqualified visual loss, left eye, normal vision right eye: Secondary | ICD-10-CM | POA: Diagnosis not present

## 2020-06-10 DIAGNOSIS — I4819 Other persistent atrial fibrillation: Secondary | ICD-10-CM

## 2020-06-10 DIAGNOSIS — H9190 Unspecified hearing loss, unspecified ear: Secondary | ICD-10-CM | POA: Insufficient documentation

## 2020-06-10 DIAGNOSIS — I5032 Chronic diastolic (congestive) heart failure: Secondary | ICD-10-CM | POA: Diagnosis not present

## 2020-06-10 DIAGNOSIS — R011 Cardiac murmur, unspecified: Secondary | ICD-10-CM | POA: Diagnosis not present

## 2020-06-10 DIAGNOSIS — K219 Gastro-esophageal reflux disease without esophagitis: Secondary | ICD-10-CM | POA: Insufficient documentation

## 2020-06-10 DIAGNOSIS — D6869 Other thrombophilia: Secondary | ICD-10-CM

## 2020-06-10 DIAGNOSIS — Z7901 Long term (current) use of anticoagulants: Secondary | ICD-10-CM | POA: Insufficient documentation

## 2020-06-10 DIAGNOSIS — F419 Anxiety disorder, unspecified: Secondary | ICD-10-CM | POA: Diagnosis not present

## 2020-06-10 DIAGNOSIS — E039 Hypothyroidism, unspecified: Secondary | ICD-10-CM | POA: Insufficient documentation

## 2020-06-10 LAB — CUP PACEART REMOTE DEVICE CHECK
Battery Impedance: 2503 Ohm
Battery Remaining Longevity: 28 mo
Battery Voltage: 2.73 V
Brady Statistic AP VP Percent: 0 %
Brady Statistic AP VS Percent: 29 %
Brady Statistic AS VP Percent: 0 %
Brady Statistic AS VS Percent: 70 %
Date Time Interrogation Session: 20211004111144
Implantable Lead Implant Date: 20111027
Implantable Lead Implant Date: 20111027
Implantable Lead Location: 753859
Implantable Lead Location: 753860
Implantable Lead Model: 4469
Implantable Lead Model: 4470
Implantable Lead Serial Number: 548229
Implantable Lead Serial Number: 686026
Implantable Pulse Generator Implant Date: 20111027
Lead Channel Impedance Value: 401 Ohm
Lead Channel Impedance Value: 447 Ohm
Lead Channel Pacing Threshold Amplitude: 0.625 V
Lead Channel Pacing Threshold Amplitude: 1.625 V
Lead Channel Pacing Threshold Pulse Width: 0.4 ms
Lead Channel Pacing Threshold Pulse Width: 0.4 ms
Lead Channel Setting Pacing Amplitude: 2 V
Lead Channel Setting Pacing Amplitude: 3.25 V
Lead Channel Setting Pacing Pulse Width: 0.4 ms
Lead Channel Setting Sensing Sensitivity: 2 mV

## 2020-06-10 LAB — BASIC METABOLIC PANEL
Anion gap: 10 (ref 5–15)
BUN: 18 mg/dL (ref 8–23)
CO2: 21 mmol/L — ABNORMAL LOW (ref 22–32)
Calcium: 8.5 mg/dL — ABNORMAL LOW (ref 8.9–10.3)
Chloride: 113 mmol/L — ABNORMAL HIGH (ref 98–111)
Creatinine, Ser: 1.06 mg/dL — ABNORMAL HIGH (ref 0.44–1.00)
GFR calc non Af Amer: 48 mL/min — ABNORMAL LOW (ref >60)
Glucose, Bld: 86 mg/dL (ref 70–99)
Potassium: 3.5 mmol/L (ref 3.5–5.1)
Sodium: 144 mmol/L (ref 135–145)

## 2020-06-10 LAB — CBC
HCT: 40.8 % (ref 36.0–46.0)
Hemoglobin: 12.9 g/dL (ref 12.0–15.0)
MCH: 31.9 pg (ref 26.0–34.0)
MCHC: 31.6 g/dL (ref 30.0–36.0)
MCV: 100.7 fL — ABNORMAL HIGH (ref 80.0–100.0)
Platelets: 160 10*3/uL (ref 150–400)
RBC: 4.05 MIL/uL (ref 3.87–5.11)
RDW: 13.6 % (ref 11.5–15.5)
WBC: 5.6 10*3/uL (ref 4.0–10.5)
nRBC: 0 % (ref 0.0–0.2)

## 2020-06-10 NOTE — Patient Instructions (Signed)
Cardioversion scheduled for Friday, October 8th  - You will need to have INR check at coumadin clinic prior to arrival to admissions. The coumadin clinic will call you with time.  - Arrive at the [facility] and go to admitting at [facility] not eat or drink anything after midnight the night prior to your procedure.  - Take all your morning medication (except diabetic medications) with a sip of water prior to arrival.  - You will not be able to drive home after your procedure.  - Do NOT miss any doses of your blood thinner - if you should miss a dose please notify our office immediately.  - If you feel as if you go back into normal rhythm prior to scheduled cardioversion, please notify our office immediately. If your procedure is canceled in the cardioversion suite you will be charged a cancellation fee.  Increase metoprolol to 75mg  once a day until day of cardioversion then reduce back to 50mg  a day

## 2020-06-10 NOTE — Telephone Encounter (Signed)
Patient's daughter called and is agreeable to appt today at 2:30 pm with Adline Peals, PA.

## 2020-06-10 NOTE — Progress Notes (Signed)
Primary Care Physician: [name], MD Primary Cardiologist: Dr [name] Primary Electrophysiologist: Dr Rayann Heman Referring Physician: Dr Melton Alar is a 86 y.o. 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 Patient is on warfarin for a CHADS2VASC score of 7. She had TAVR 10/2019, post operative course complicated by AF and diastolic CHF. She underwent DCCV on 12/05/19. The device clinic received an alert for an ongoing afib episode starting 06/04/20 with rapid rates. She has noted symptoms of increased fatigue, dyspnea with exertion, and palpitations. There were no triggers that she could identify.   Today, she denies symptoms of chest pain, orthopnea, PND, lower extremity edema, dizziness, presyncope, syncope, snoring, daytime somnolence, bleeding, or neurologic sequela. The patient is tolerating medications without difficulties and is otherwise without complaint today.    Atrial Fibrillation Risk Factors:  she does not have symptoms or diagnosis of sleep apnea.   she has a BMI of Body mass index is 19.67 kg/m.[name] Filed Weights   06/10/20 1432  Weight: 44.2 kg    Family History  Problem Relation Age of Onset  . Colon cancer Mother 70  . Heart disease Mother   . Heart disease Father   . Heart attack Father   . Heart disease Maternal Grandmother   . Stomach cancer Brother   . Esophageal cancer Neg Hx   . Rectal cancer Neg Hx      Atrial Fibrillation Management history:  Previous antiarrhythmic drugs: amiodarone Previous cardioversions: 12/05/19 Previous ablations: none CHADS2VASC score: 7 Anticoagulation history: warfarin    Past Medical History:  Diagnosis Date  .  Adenocarcinoma of stomach ([place]) 11/11/2015   gastric mass on egd,   . Anal fissure   . Anemia    hx 3/17 iron infusion, on aranesp and injected B12 since 11/2015.   [name] Anxiety   . Aortic root dilatation ([place])   . Arthritis    oa  . Bladder cancer ([place])   . Blind left eye 2015   partial blind in left eye due to stroke   . 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 CAD (coronary artery disease)   . Cataracts, bilateral   . Chronic kidney disease    only has one kidney rt lft rem 03 ca     dr. Risa Grill, bladder cancer 05-04-2018  . Elevated LFTs    hepatic steatosis, none recent  . Endometrial cancer ([place])   . Gastroparesis   . GERD (gastroesophageal reflux disease)   . Hearing loss    wears hearing aids  . Heart murmur   . History of uterine cancer 1980s   treated with hysterectomy, external radiation and radiation seed implants.   [name] HTN (hypertension)   . Hyperlipemia   . Hypothyroidism   . Iron deficiency anemia due to chronic blood loss 11/24/2015  . Neuropathy    right leg  . Osteomyelitis ([place]) as child  . PAF (paroxysmal atrial fibrillation) ([place])   . Presence of permanent cardiac pacemaker   . Refusal of blood transfusions as patient is Jehovah's Witness   . Restless leg syndrome   . 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  Severe aortic stenosis   . Stroke [facility]) 2015   partial stroke left legally blind; left eye   . Tachycardia-bradycardia syndrome ([place])    s/p PPM by Dr Doreatha Lew (MDT) 07/03/10  . 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 Walker as ambulation aid   . Wears glasses   . Wears partial dentures    upper   Past Surgical History:  Procedure Laterality Date  . CARDIAC CATHETERIZATION  2008  . CARDIOVERSION N/A 12/05/2019   Procedure: CARDIOVERSION;  Surgeon: Elouise Munroe, MD;  Location[facility] ENDOSCOPY;  Service: Cardiovascular;  Laterality:  N/A;  . CHOLECYSTECTOMY  1970s  . COLONOSCOPY N/A 11/11/2015   Procedure: COLONOSCOPY;  Surgeon: [name], MD;  Location: [facility] ENDOSCOPY;  Service: Endoscopy;  Laterality: N/A;  . CYSTOSCOPY W/ RETROGRADES Right 05/01/2018   Procedure: CYSTOSCOPY WITH RIGHT RETROGRADE PYELOGRAM;  Surgeon: Lucas Mallow, MD;  Location: [facility] ORS;  Service: Urology;  Laterality: Right;  . CYSTOSCOPY W/ RETROGRADES Right 07/21/2018   Procedure: CYSTOSCOPY WITH RIGHT RETROGRADE PYELOGRAM;  Surgeon: Alexis Frock, MD;  Location: [facility] ORS;  Service: Urology;  Laterality: Right;  . ESOPHAGOGASTRODUODENOSCOPY N/A 11/11/2015   Procedure: ESOPHAGOGASTRODUODENOSCOPY (EGD);  Surgeon: [name], MD;  Location: [facility];  Service: Endoscopy;  Laterality: N/A;  . ESOPHAGOGASTRODUODENOSCOPY N/A 02/05/2016   Procedure: ESOPHAGOGASTRODUODENOSCOPY (EGD);  Surgeon: Ladene Artist, MD;  Location: [facility] ENDOSCOPY;  Service: Endoscopy;  Laterality: N/A;  . ESOPHAGOGASTRODUODENOSCOPY N/A 04/02/2016   Procedure: ESOPHAGOGASTRODUODENOSCOPY (EGD);  Surgeon: [name], MD;  Location: [facility];  Service: Endoscopy;  Laterality: N/A;  . ESOPHAGOGASTRODUODENOSCOPY (EGD) WITH PROPOFOL N/A 04/06/2017   Procedure: ESOPHAGOGASTRODUODENOSCOPY (EGD) WITH PROPOFOL;  Surgeon: [name], MD;  [facility] END[facility];  Service: Endoscopy;  Laterality: N/A;  . EYE SURGERY Bilateral    remove cataracts  . GASTRECTOMY N/A 01/15/2016   Procedure: OPEN PARTIAL GASTRECTOMY;  Surgeon: Stark Klein, MD;  Location: [place];  Service: General;  Laterality: N/A;  . GASTRECTOMY Left 02/24/2016   Procedure: OPEN J TUBE;  Surgeon: Stark Klein, MD;  Location: [place];  Service: General;  Laterality: Left;  [name] GASTROJEJUNOSTOMY N/A 05/18/2016   Procedure: ROUX EN [place];  Surgeon: Stark Klein, MD;  Location: [place];  Service: General;  Laterality: N/A;  . I & D KNEE WITH POLY EXCHANGE Right 12/17/2013   Procedure: IRRIGATION AND DEBRIDEMEN  RIGHT TOTAL  KNEE WITH POLY EXCHANGE;  Surgeon: Mauri Pole, MD;  Location: [facility]S;  Service: Orthopedics;  Laterality: Right;  . IR GENERIC HISTORICAL  06/17/2016   IR RADIOLOGIST EVAL & MGMT 06/17/2016 Ascencion Dike, PA-C [facility] INTERV RAD  . LAPAROSCOPY N/A 01/15/2016   Procedure: LAPAROSCOPY DIAGNOSTIC;  Surgeon: Stark Klein, MD;  Location: [place];  Service: General;  Laterality: N/A;  . LYSIS OF ADHESION  05/18/2016   Procedure: LYSIS OF ADHESION;  Surgeon: Stark Klein, MD;  Location: [place];  Service: General;;  . NEPHRECTOMY Left    renal cell cancer  . PACEMAKER INSERTION  07/04/2011   MDT PPM implanted by Dr Doreatha Lew for tachy/brady; managed by Dr [place] Grayer   . PATELLAR TENDON REPAIR Right 03/06/2014   Procedure: AVULSION WITH PATELLA TENDON REPAIR;  Surgeon: Mauri Pole, MD;  [facility];  Service: Orthopedics;  Laterality: Right;  . PATENT DUCTUS ARTERIOUS REPAIR  ~ 1949  . RIGHT/LEFT HEART CATH AND CORONARY ANGIOGRAPHY N/A 10/09/2019   Procedure: RIGHT/LEFT HEART CATH AND CORONARY ANGIOGRAPHY;  Surgeon: [name], [name], MD;  Location: [place] CV LAB;  Service: Cardiovascular;  Laterality: N/A;  . TEE WITHOUT CARDIOVERSION N/A 10/30/2019   Procedure: TRANSESOPHAGEAL ECHOCARDIOGRAM (TEE);  Surgeon: Burnell Blanks, MD;  Location: [place];  Service: Open Heart Surgery;  Laterality: N/A;  . TOTAL ABDOMINAL HYSTERECTOMY  85  . TOTAL HIP REVISION Right 2009   initial replacment surg  . TOTAL KNEE ARTHROPLASTY  02/07/2012   Procedure: TOTAL KNEE ARTHROPLASTY;  Surgeon: Mauri Pole, MD;  [facility];  Service: Orthopedics;  Laterality: Right;  . TOTAL KNEE REVISION Right 07/29/2014   Procedure: RIGHT KNEE REVISION OF PREVIOUS REPAIR EXTENSOR MECHANISM;  Surgeon: Mauri Pole, MD;  [facility];  Service: Orthopedics;  Laterality: Right;  . TRANSCATHETER AORTIC VALVE REPLACEMENT, CAROTID Left 10/30/2019   TRANSCATHETER AORTIC VALVE REPLACEMENT, LEFT CAROTID  (Left Neck)   . TRANSCATHETER AORTIC VALVE REPLACEMENT, CAROTID Left 10/30/2019   Procedure: TRANSCATHETER AORTIC VALVE REPLACEMENT, LEFT CAROTID;  Surgeon: Burnell Blanks, MD;  Location: [place];  Service: Open Heart Surgery;  Laterality: Left;  . TRANSURETHRAL RESECTION OF BLADDER TUMOR N/A 07/21/2018   Procedure: TRANSURETHRAL RESECTION OF BLADDER TUMOR (TURBT);  Surgeon: Alexis Frock, MD;  [facility];  Service: Urology;  Laterality: N/A;  . TRANSURETHRAL RESECTION OF BLADDER TUMOR WITH MITOMYCIN-C N/A 05/01/2018   Procedure: TRANSURETHRAL RESECTION OF BLADDER TUMOR WITH POST OP INSTILLATION OF GEMCITABINE;  Surgeon: Lucas Mallow, MD;  [facility];  Service: Urology;  Laterality: N/A;  . WHIPPLE PROCEDURE N/A 05/18/2016   Procedure: EXPLORATORY LAPAROTOMY;  Surgeon: Stark Klein, MD;  Location: [place] [facility];  Service: General;  Laterality: N/A;    Current Outpatient Medications  Medication Sig Dispense Refill  . acetaminophen (TYLENOL) 500 MG tablet Take 500-1,000 mg by mouth every 6 (six) hours as needed for mild pain or headache.     . ALPRAZolam (XANAX) 0.5 MG tablet Take 0.5 mg by mouth 2 (two) times daily as needed for anxiety.     . Alum & Mag Hydroxide-Simeth (MYLANTA PO) Take 20 mLs by mouth every evening.     [name] amoxicillin (AMOXIL) 500 MG tablet Take 4 tablets (2,000 mg total) by mouth as directed. 1 hour prior to dental work including cleanings 12 tablet 12  . Ascorbic Acid (VITAMIN C) 1000 MG tablet Take 1,000 mg by mouth daily.    . Cholecalciferol (VITAMIN D-3) 5000 units TABS Take 5,000 Units by mouth 3 (three) times daily.     . Cyanocobalamin 5000 MCG SUBL Place under the tongue.    [name] erythromycin ophthalmic ointment Place 1 application into both eyes at bedtime as needed (stye).     . famotidine (PEPCID) 40 MG tablet Take 1 tablet Daily for Indigestion & Heartburn 90 tablet 3  . ferrous sulfate 325 (65 FE) MG tablet Take 325 mg by mouth daily in the  afternoon.    . furosemide (LASIX) 20 MG tablet Take 1 tablet (20 mg total) by mouth daily. 90 tablet 3  . gabapentin (NEURONTIN) 300 MG capsule TAKE 1 CAPSULE BY MOUTH THREE TIMES A DAY 270 capsule 1  . hydrocortisone (ANUSOL-HC) 2.5 % rectal cream Place rectally 2 (two) times daily. 30 g 1  . levothyroxine (SYNTHROID) 100 MCG tablet TAKE 1/2 TABLET DAILY FOR THYROID. 45 tablet 1  . loperamide (IMODIUM A-D) 2 MG tablet Take up to 12 tablets /day as needed for  for Diarrhea 120 tablet 11  . loratadine (CLARITIN) 10  MG tablet Take 10 mg by mouth at bedtime.     . metoprolol succinate (TOPROL-XL) 50 MG 24 hr tablet Take 1 tablet (50 mg total) by mouth daily for 30 doses. Take with or immediately following a meal. 30 tablet 6  . nitroGLYCERIN (NITROSTAT) 0.4 MG SL tablet Dissolve 1 tablet under tongue every 5 minutes if needed for Angina 50 tablet prn  . ondansetron (ZOFRAN-ODT) 8 MG disintegrating tablet Dissolve 1 tablet under tongue every 6 to 8 hours  3 x /day if needed for Nausea or Vomitting 30 tablet 3  . potassium chloride (KLOR-CON) 10 MEQ tablet Take 1 tablet (10 mEq total) by mouth daily. 90 tablet 3  . Propylene Glycol (SYSTANE COMPLETE) 0.6 % SOLN Place 1 drop into both eyes 3 (three) times daily as needed (irritation/dry eyes.).    [name] simethicone (MYLICON) 80 MG chewable tablet Chew 80 mg by mouth every 6 (six) hours as needed for flatulence.    . sucralfate (CARAFATE) 1 g tablet Take 1 g by mouth 4 (four) times daily.    [name] tobramycin-dexamethasone (TOBRADEX) ophthalmic solution Place 1 drop into both eyes daily as needed (stye).     [name] triamcinolone cream (KENALOG) 0.1 % Apply 1 application topically 2 (two) times daily as needed (rash/skin irritation.).     [name] verapamil (CALAN-SR) 120 MG CR tablet TAKE 1 TABLET 2 X /DAY WITH A MEAL FOR BP & HEART RHYTHM 180 tablet 3  . warfarin (COUMADIN) 2 MG tablet TAKE 1 TO 2 TABLETS AS DIRECTED TO PREVENT BLOOD CLOTS 180 tablet 1   No current  facility-administered medications for this encounter.    Allergies  Allergen Reactions  . Amiodarone Other (See Comments)     Thyroid and liver and kidney problems  . Adhesive [Tape] Other (See Comments)    Tears skin, Please use "paper" tape  . Hydrocodone Other (See Comments)    HALLLUCINATIONS  . Other Other (See Comments)    NO BLOOD PRODUCTS -  [place] Witness  . Reglan [Metoclopramide] Hives, Itching and Rash  . Remeron [Mirtazapine] Other (See Comments)    Hallucinations and make pt loopy  . Codeine Nausea And Vomiting  . Morphine And Related Nausea And Vomiting  . Requip [Ropinirole Hcl] Other (See Comments)    Headache   . Zinc Nausea Only    Social History   Socioeconomic History  . Marital status: Widowed    Spouse name: Not on file  . Number of children: 3  . Years of education: Not on file  . Highest education level: Not on file  Occupational History  . Not on file  Tobacco Use  . Smoking status: Never Smoker  . Smokeless tobacco: Never Used  Vaping Use  . Vaping Use: Never used  Substance and Sexual Activity  . Alcohol use: No  . Drug use: No  . Sexual activity: Never    Comment: Hysterectomy  Other Topics Concern  . Not on file  Social History Narrative   01/01/19 Lives in [place] w/dgtr, [name].  Continues to work.   Social Determinants of Health   Financial Resource Strain:   . Difficulty of Paying Living Expenses: Not on file  Food Insecurity:   . Worried About Charity fundraiser in the Last Year: Not on file  . Ran Out of Food in the Last Year: Not on file  Transportation Needs:   . Lack of Transportation (Medical): Not on file  . Lack of Transportation (  Non-Medical): Not on file  Physical Activity:   . Days of Exercise per Week: Not on file  . Minutes of Exercise per Session: Not on file  Stress:   . Feeling of Stress : Not on file  Social Connections:   . Frequency of Communication with Friends and Family: Not on file  .  Frequency of Social Gatherings with Friends and Family: Not on file  . Attends Religious Services: Not on file  . Active Member of Clubs or Organizations: Not on file  . Attends Archivist Meetings: Not on file  . Marital Status: Not on file  Intimate Partner Violence:   . Fear of Current or Ex-Partner: Not on file  . Emotionally Abused: Not on file  . Physically Abused: Not on file  . Sexually Abused: Not on file     ROS- All systems are reviewed and negative except as per the HPI above.  Physical Exam: Vitals:   06/10/20 1432  BP: (!) 150/70  Pulse: (!) 108  Weight: 44.2 kg  Height: 4\' 11"  (1.499 m)    GEN- The patient is well appearing elderly female, alert and oriented x 3 today.   HEENT-head normocephalic, atraumatic, sclera clear, conjunctiva pink, hearing intact, trachea midline. Lungs- Clear to ausculation bilaterally, normal work of breathing Heart- irregular rate and rhythm, no murmurs, rubs or gallops  GI- soft, NT, ND, + BS Extremities- no clubbing, cyanosis, or edema MS- no significant deformity or atrophy Skin- no rash or lesion Psych- euthymic mood, full affect Neuro- strength and sensation are intact   Wt Readings from Last 3 Encounters:  06/10/20 44.2 kg  05/23/20 43.1 kg  05/08/20 43.5 kg    EKG today demonstrates afib HR 108, QRS 80, QTc 439  Echo 11/22/19 demonstrated  1. Left ventricular ejection fraction, by estimation, is 55 to 60%. The  left ventricle has normal function. The left ventricle has no regional  wall motion abnormalities. There is mild concentric left ventricular  hypertrophy. Left ventricular diastolic  parameters are indeterminate. Elevated left ventricular end-diastolic  pressure.  2. Right ventricular systolic function is normal. The right ventricular  size is normal. There is mildly elevated pulmonary artery systolic  pressure.  3. Left atrial size was severely dilated.  4. Right atrial size was severely  dilated.  5. The mitral valve is normal in structure. Mild mitral valve  regurgitation. No evidence of mitral stenosis.  6. Mild to moderate perivalvular aortic regurgitation unchanged from  prior. The aortic valve has been repaired/replaced. Aortic valve  regurgitation is mild to moderate. No aortic stenosis is present. There is  a 23 mm Edwards Sapien prosthetic (TAVR)  valve present in the aortic position. Procedure Date: 10/30/19. Aortic  regurgitation PHT measures 358 msec. Aortic valve mean gradient measures  10.0 mmHg. Aortic valve Vmax measures 2.11 m/s.  7. Aortic dilatation noted. There is mild dilatation of the ascending  aorta.  8. The inferior vena cava is normal in size with greater than 50%  respiratory variability, suggesting right atrial pressure of 3 mmHg.   Epic records are reviewed at length today  CHA2DS2-VASc Score = 7  The patient's score is based upon: CHF History: 0 HTN History: 1 Age : 2 Diabetes History: 0 Stroke History: 2 Vascular Disease History: 1 Gender: 1      ASSESSMENT AND PLAN: 1. Persistent Atrial Fibrillation (ICD10:  I48.19) The patient's CHA2DS2-VASc score is 7, indicating a 11.2% annual risk of stroke.   Now appears  to be persistent. We discussed therapeutic options today including DCCV. Given her symptoms, will proceed with TEE guided DCCV instead of waiting for 4 therapeutic INRs. INR on 06/06/20 was 3.1. If she has early return of her afib, she may need AAD. Recall patient has amiodarone listed as an allergy for 'thyroid, liver, and kidney problems'. Her AAD options may be limited with a CrCl of 28 mL/min. Will discuss with Dr Rayann Heman.  Continue warfarin Will increase Toprol to 75 mg daily until DCCV, then decrease back to 50 mg daily. Continue verapamil 120 mg BID  2. Secondary Hypercoagulable State (ICD10:  D68.69) The patient is at significant risk for stroke/thromboembolism based upon her CHA2DS2-VASc Score of 7.  Continue  Warfarin (Coumadin).   3. Tachybradycardia syndrome S/p PPM, followed by Dr Rayann Heman and the device clinic.  4. HTN Stable, med changes as above.   Follow up with Dr [name] as scheduled post DCCV. AF clinic in 2 months.    [facility] [address] [place], [place] [address] [phone] 06/10/2020 3:42 PM

## 2020-06-10 NOTE — Telephone Encounter (Signed)
Received message from [facility] that pt is scheduled for a DCCV on 10/8 at 11am and they need INR prior to procedure. Called and spoke to pt's home health nurse, Lawerance Bach, who stated that she could check pt's INR at 8 am on Friday 10/8 prior to procedure.

## 2020-06-10 NOTE — Telephone Encounter (Signed)
Scheduled remote reviewed on 06/09/20. AF burden 6.6%, presenting AF with VR 80-120 bpm. Calling to assess, medication compliance   Spoke to [place] ([facility]), states the patient has reported of increased shortness of breath over the past week. Reports shortness of breath with worse with exertion but denies at rest. Denies any pain. Compliant with medications.    Manual transmission requested, presenting AF/ VR 100-140's.   Advised I will forward this to the AF clinic and someone from their office will be in touch. Verbalized understanding.

## 2020-06-10 NOTE — Telephone Encounter (Signed)
Called and spoke to pt's daughter, [name] and made her aware that Tabitha the home health nurse should be coming out on 10/8 at 8am to check INR prior to cardioversion.

## 2020-06-10 NOTE — H&P (View-Only) (Signed)
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 Patient is on warfarin for a CHADS2VASC score of 7. She had TAVR 10/2019, post operative course complicated by AF and diastolic CHF. She underwent DCCV on 12/05/19. The device clinic received an alert for an ongoing afib episode starting 06/04/20 with rapid rates. She has noted symptoms of increased fatigue, dyspnea with exertion, and palpitations. There were no triggers that she could identify.   Today, she denies symptoms of chest pain, orthopnea, PND, lower extremity edema, dizziness, presyncope, syncope, snoring, daytime somnolence, bleeding, or neurologic sequela. The patient is tolerating medications without difficulties and is otherwise without complaint today.    Atrial Fibrillation Risk Factors:  she does not have symptoms or diagnosis of sleep apnea.   she has a BMI of Body mass index is 19.67 kg/m.[name] Filed Weights   06/10/20 1432  Weight: 44.2 kg    Family History  Problem Relation Age of Onset  . Colon cancer Mother 17  . Heart disease Mother   . Heart disease Father   . Heart attack Father   . Heart disease Maternal Grandmother   . Stomach cancer Brother   . Esophageal cancer Neg Hx   . Rectal cancer Neg Hx      Atrial Fibrillation Management history:  Previous antiarrhythmic drugs: amiodarone Previous cardioversions: 12/05/19 Previous ablations: none CHADS2VASC score: 7 Anticoagulation history: warfarin    Past Medical History:  Diagnosis Date  .  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 Anal fissure   . Anemia    hx 3/17 iron infusion, on aranesp and injected B12 since 11/2015.   [name] Anxiety   . Aortic root dilatation ([place])   . Arthritis    oa  . Bladder cancer ([place])   . Blind left eye 2015   partial blind in left eye due to stroke   . 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 CAD (coronary artery disease)   . Cataracts, bilateral   . Chronic kidney disease    only has one kidney rt lft rem 03 ca     dr. Risa Grill, bladder cancer 05-04-2018  . Elevated LFTs    hepatic steatosis, none recent  . Endometrial cancer ([place])   . Gastroparesis   . GERD (gastroesophageal reflux disease)   . Hearing loss    wears hearing aids  . Heart murmur   . History of uterine cancer 1980s   treated with hysterectomy, external radiation and radiation seed implants.   [name] HTN (hypertension)   . Hyperlipemia   . Hypothyroidism   . Iron deficiency anemia due to chronic blood loss 11/24/2015  . Neuropathy    right leg  . Osteomyelitis ([place]) as child  . PAF (paroxysmal atrial fibrillation) ([place])   . Presence of permanent cardiac pacemaker   . Refusal of blood transfusions as patient is Jehovah's Witness   . Restless leg syndrome   . 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  Severe aortic stenosis   . Stroke [facility]) 2015   partial stroke left legally blind; left eye   . Tachycardia-bradycardia syndrome ([place])    s/p PPM by Dr Doreatha Lew (MDT) 07/03/10  . 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 Walker as ambulation aid   . Wears glasses   . Wears partial dentures    upper   Past Surgical History:  Procedure Laterality Date  . CARDIAC CATHETERIZATION  2008  . CARDIOVERSION N/A 12/05/2019   Procedure: CARDIOVERSION;  Surgeon: Elouise Munroe, MD;  Location: [facility] ENDOSCOPY;  Service: Cardiovascular;  Laterality:  N/A;  . CHOLECYSTECTOMY  1970s  . COLONOSCOPY N/A 11/11/2015   Procedure: COLONOSCOPY;  Surgeon: [name], MD;  Location: [facility] ENDOSCOPY;  Service: Endoscopy;  Laterality: N/A;  . CYSTOSCOPY W/ RETROGRADES Right 05/01/2018   Procedure: CYSTOSCOPY WITH RIGHT RETROGRADE PYELOGRAM;  Surgeon: Lucas Mallow, MD;  Location: [facility] ORS;  Service: Urology;  Laterality: Right;  . CYSTOSCOPY W/ RETROGRADES Right 07/21/2018   Procedure: CYSTOSCOPY WITH RIGHT RETROGRADE PYELOGRAM;  Surgeon: Alexis Frock, MD;  Location: [facility] ORS;  Service: Urology;  Laterality: Right;  . ESOPHAGOGASTRODUODENOSCOPY N/A 11/11/2015   Procedure: ESOPHAGOGASTRODUODENOSCOPY (EGD);  Surgeon: [name], MD;  Location: [facility] ENDOSCOPY;  Service: Endoscopy;  Laterality: N/A;  . ESOPHAGOGASTRODUODENOSCOPY N/A 02/05/2016   Procedure: ESOPHAGOGASTRODUODENOSCOPY (EGD);  Surgeon: Ladene Artist, MD;  Location[facility] ENDOSCOPY;  Service: Endoscopy;  Laterality: N/A;  . ESOPHAGOGASTRODUODENOSCOPY N/A 04/02/2016   Procedure: ESOPHAGOGASTRODUODENOSCOPY (EGD);  Surgeon: [name], MD;  Location: [facility] ENDOSCOPY;  Service: Endoscopy;  Laterality: N/A;  . ESOPHAGOGASTRODUODENOSCOPY (EGD) WITH PROPOFOL N/A 04/06/2017   Procedure: ESOPHAGOGASTRODUODENOSCOPY (EGD) WITH PROPOFOL;  Surgeon: [name], MD;  Location: [facility] ENDOSCOPY;  Service: Endoscopy;  Laterality: N/A;  . EYE SURGERY Bilateral    remove cataracts  . GASTRECTOMY N/A 01/15/2016   Procedure: OPEN PARTIAL GASTRECTOMY;  Surgeon: Stark Klein, MD;  Location: [facility];  Service: General;  Laterality: N/A;  . GASTRECTOMY Left 02/24/2016   Procedure: OPEN J TUBE;  Surgeon: Stark Klein, MD;  Location: [facility];  Service: General;  Laterality: Left;  [name] GASTROJEJUNOSTOMY N/A 05/18/2016   Procedure: ROUX EN [place];  Surgeon: Stark Klein, MD;  Location: [facility];  Service: General;  Laterality: N/A;  . I & D KNEE WITH POLY EXCHANGE Right 12/17/2013   Procedure: IRRIGATION AND DEBRIDEMEN  RIGHT TOTAL  KNEE WITH POLY EXCHANGE;  Surgeon: Mauri Pole, MD;  Location: [facility] ORS;  Service: Orthopedics;  Laterality: Right;  . IR GENERIC HISTORICAL  06/17/2016   IR RADIOLOGIST EVAL & MGMT 06/17/2016 Ascencion Dike, PA-C [facility] INTERV RAD  . LAPAROSCOPY N/A 01/15/2016   Procedure: LAPAROSCOPY DIAGNOSTIC;  Surgeon: Stark Klein, MD;  Location: [facility];  Service: General;  Laterality: N/A;  . LYSIS OF ADHESION  05/18/2016   Procedure: LYSIS OF ADHESION;  Surgeon: Stark Klein, MD;  Location: [facility];  Service: General;;  . NEPHRECTOMY Left    renal cell cancer  . PACEMAKER INSERTION  07/04/2011   MDT PPM implanted by Dr Doreatha Lew for tachy/brady; managed by Dr Thompson Grayer   . PATELLAR TENDON REPAIR Right 03/06/2014   Procedure: AVULSION WITH PATELLA TENDON REPAIR;  Surgeon: Mauri Pole, MD;  [facility]: [facility]S;  Service: Orthopedics;  Laterality: Right;  . PATENT DUCTUS ARTERIOUS REPAIR  ~ 1949  . RIGHT/LEFT HEART CATH AND CORONARY ANGIOGRAPHY N/A 10/09/2019   Procedure: RIGHT/LEFT HEART CATH AND CORONARY ANGIOGRAPHY;  Surgeon: [name], [name], MD;  Location: [facility] CV LAB;  Service: Cardiovascular;  Laterality: N/A;  . TEE WITHOUT CARDIOVERSION N/A 10/30/2019   Procedure: TRANSESOPHAGEAL ECHOCARDIOGRAM (TEE);  Surgeon: Burnell Blanks, MD;  Location: [facility];  Service: Open Heart Surgery;  Laterality: N/A;  . TOTAL ABDOMINAL HYSTERECTOMY  Valerie  . TOTAL HIP REVISION Right 2009   initial replacment surg  . TOTAL KNEE ARTHROPLASTY  02/07/2012   Procedure: TOTAL KNEE ARTHROPLASTY;  Surgeon: Mauri Pole, MD;  [facility]S;  Service: Orthopedics;  Laterality: Right;  . TOTAL KNEE REVISION Right 07/29/2014   Procedure: RIGHT KNEE REVISION OF PREVIOUS REPAIR EXTENSOR MECHANISM;  Surgeon: Mauri Pole, MD;  Location: [facility] ORS;  Service: Orthopedics;  Laterality: Right;  . TRANSCATHETER AORTIC VALVE REPLACEMENT, CAROTID Left 10/30/2019   TRANSCATHETER AORTIC VALVE REPLACEMENT, LEFT CAROTID  (Left Neck)   . TRANSCATHETER AORTIC VALVE REPLACEMENT, CAROTID Left 10/30/2019   Procedure: TRANSCATHETER AORTIC VALVE REPLACEMENT, LEFT CAROTID;  Surgeon: Burnell Blanks, MD;  Location: [facility];  Service: Open Heart Surgery;  Laterality: Left;  . TRANSURETHRAL RESECTION OF BLADDER TUMOR N/A 07/21/2018   Procedure: TRANSURETHRAL RESECTION OF BLADDER TUMOR (TURBT);  Surgeon: Alexis Frock, MD;  Location[facility];  Service: Urology;  Laterality: N/A;  . TRANSURETHRAL RESECTION OF BLADDER TUMOR WITH MITOMYCIN-C N/A 05/01/2018   Procedure: TRANSURETHRAL RESECTION OF BLADDER TUMOR WITH POST OP INSTILLATION OF GEMCITABINE;  Surgeon: Lucas Mallow, MD;  Location[facility] ORS;  Service: Urology;  Laterality: N/A;  . WHIPPLE PROCEDURE N/A 05/18/2016   Procedure: EXPLORATORY LAPAROTOMY;  Surgeon: Stark Klein, MD;  Location: [facility] OR;  Service: General;  Laterality: N/A;    Current Outpatient Medications  Medication Sig Dispense Refill  . acetaminophen (TYLENOL) 500 MG tablet Take 500-1,000 mg by mouth every 6 (six) hours as needed for mild pain or headache.     . ALPRAZolam (XANAX) 0.5 MG tablet Take 0.5 mg by mouth 2 (two) times daily as needed for anxiety.     . Alum & Mag Hydroxide-Simeth (MYLANTA PO) Take 20 mLs by mouth every evening.     [name] amoxicillin (AMOXIL) 500 MG tablet Take 4 tablets (2,000 mg total) by mouth as directed. 1 hour prior to dental work including cleanings 12 tablet 12  . Ascorbic Acid (VITAMIN C) 1000 MG tablet Take 1,000 mg by mouth daily.    . Cholecalciferol (VITAMIN D-3) 5000 units TABS Take 5,000 Units by mouth 3 (three) times daily.     . Cyanocobalamin 5000 MCG SUBL Place under the tongue.    [name] erythromycin ophthalmic ointment Place 1 application into both eyes at bedtime as needed (stye).     . famotidine (PEPCID) 40 MG tablet Take 1 tablet Daily for Indigestion & Heartburn 90 tablet 3  . ferrous sulfate 325 (65 FE) MG tablet Take 325 mg by mouth daily in the  afternoon.    . furosemide (LASIX) 20 MG tablet Take 1 tablet (20 mg total) by mouth daily. 90 tablet 3  . gabapentin (NEURONTIN) 300 MG capsule TAKE 1 CAPSULE BY MOUTH THREE TIMES A DAY 270 capsule 1  . hydrocortisone (ANUSOL-HC) 2.5 % rectal cream Place rectally 2 (two) times daily. 30 g 1  . levothyroxine (SYNTHROID) 100 MCG tablet TAKE 1/2 TABLET DAILY FOR THYROID. 45 tablet 1  . loperamide (IMODIUM A-D) 2 MG tablet Take up to 12 tablets /day as needed for  for Diarrhea 120 tablet 11  . loratadine (CLARITIN) 10  MG tablet Take 10 mg by mouth at bedtime.     . metoprolol succinate (TOPROL-XL) 50 MG 24 hr tablet Take 1 tablet (50 mg total) by mouth daily for 30 doses. Take with or immediately following a meal. 30 tablet 6  . nitroGLYCERIN (NITROSTAT) 0.4 MG SL tablet Dissolve 1 tablet under tongue every 5 minutes if needed for Angina 50 tablet prn  . ondansetron (ZOFRAN-ODT) 8 MG disintegrating tablet Dissolve 1 tablet under tongue every 6 to 8 hours  3 x /day if needed for Nausea or Vomitting 30 tablet 3  . potassium chloride (KLOR-CON) 10 MEQ tablet Take 1 tablet (10 mEq total) by mouth daily. 90 tablet 3  . Propylene Glycol (SYSTANE COMPLETE) 0.6 % SOLN Place 1 drop into both eyes 3 (three) times daily as needed (irritation/dry eyes.).    [name] simethicone (MYLICON) 80 MG chewable tablet Chew 80 mg by mouth every 6 (six) hours as needed for flatulence.    . sucralfate (CARAFATE) 1 g tablet Take 1 g by mouth 4 (four) times daily.    [name] tobramycin-dexamethasone (TOBRADEX) ophthalmic solution Place 1 drop into both eyes daily as needed (stye).     [name] triamcinolone cream (KENALOG) 0.1 % Apply 1 application topically 2 (two) times daily as needed (rash/skin irritation.).     [name] verapamil (CALAN-SR) 120 MG CR tablet TAKE 1 TABLET 2 X /DAY WITH A MEAL FOR BP & HEART RHYTHM 180 tablet 3  . warfarin (COUMADIN) 2 MG tablet TAKE 1 TO 2 TABLETS AS DIRECTED TO PREVENT BLOOD CLOTS 180 tablet 1   No current  facility-administered medications for this encounter.    Allergies  Allergen Reactions  . Amiodarone Other (See Comments)     Thyroid and liver and kidney problems  . Adhesive [Tape] Other (See Comments)    Tears skin, Please use "paper" tape  . Hydrocodone Other (See Comments)    HALLLUCINATIONS  . Other Other (See Comments)    NO BLOOD PRODUCTS -  [place] Witness  . Reglan [Metoclopramide] Hives, Itching and Rash  . Remeron [Mirtazapine] Other (See Comments)    Hallucinations and make pt loopy  . Codeine Nausea And Vomiting  . Morphine And Related Nausea And Vomiting  . Requip [Ropinirole Hcl] Other (See Comments)    Headache   . Zinc Nausea Only    Social History   Socioeconomic History  . Marital status: Widowed    Spouse name: Not on file  . Number of children: 3  . Years of education: Not on file  . Highest education level: Not on file  Occupational History  . Not on file  Tobacco Use  . Smoking status: Never Smoker  . Smokeless tobacco: Never Used  Vaping Use  . Vaping Use: Never used  Substance and Sexual Activity  . Alcohol use: No  . Drug use: No  . Sexual activity: Never    Comment: Hysterectomy  Other Topics Concern  . Not on file  Social History Narrative   01/01/19 Lives in [place] w/dgtr, [name].  Continues to work.   Social Determinants of Health   Financial Resource Strain:   . Difficulty of Paying Living Expenses: Not on file  Food Insecurity:   . Worried About Charity fundraiser in the Last Year: Not on file  . Ran Out of Food in the Last Year: Not on file  Transportation Needs:   . Lack of Transportation (Medical): Not on file  . Lack of Transportation (  Non-Medical): Not on file  Physical Activity:   . Days of Exercise per Week: Not on file  . Minutes of Exercise per Session: Not on file  Stress:   . Feeling of Stress : Not on file  Social Connections:   . Frequency of Communication with Friends and Family: Not on file  .  Frequency of Social Gatherings with Friends and Family: Not on file  . Attends Religious Services: Not on file  . Active Member of Clubs or Organizations: Not on file  . Attends Archivist Meetings: Not on file  . Marital Status: Not on file  Intimate Partner Violence:   . Fear of Current or Ex-Partner: Not on file  . Emotionally Abused: Not on file  . Physically Abused: Not on file  . Sexually Abused: Not on file     ROS- All systems are reviewed and negative except as per the HPI above.  Physical Exam: Vitals:   06/10/20 1432  BP: (!) 150/70  Pulse: (!) 108  Weight: 44.2 kg  Height: 4\' 11"  (1.499 m)    GEN- The patient is well appearing elderly [name], alert and oriented x 3 today.   HEENT-head normocephalic, atraumatic, sclera clear, conjunctiva pink, hearing intact, trachea midline. Lungs- Clear to ausculation bilaterally, normal work of breathing Heart- irregular rate and rhythm, no murmurs, rubs or gallops  GI- soft, NT, ND, + BS Extremities- no clubbing, cyanosis, or edema MS- no significant deformity or atrophy Skin- no rash or lesion Psych- euthymic mood, full affect Neuro- strength and sensation are intact   Wt Readings from Last 3 Encounters:  06/10/20 44.2 kg  05/23/20 43.1 kg  05/08/20 43.5 kg    EKG today demonstrates afib HR 108, QRS 80, QTc 439  Echo 11/22/19 demonstrated  1. Left ventricular ejection fraction, by estimation, is 55 to 60%. The  left ventricle has normal function. The left ventricle has no regional  wall motion abnormalities. There is mild concentric left ventricular  hypertrophy. Left ventricular diastolic  parameters are indeterminate. Elevated left ventricular end-diastolic  pressure.  2. Right ventricular systolic function is normal. The right ventricular  size is normal. There is mildly elevated pulmonary artery systolic  pressure.  3. Left atrial size was severely dilated.  4. Right atrial size was severely  dilated.  5. The mitral valve is normal in structure. Mild mitral valve  regurgitation. No evidence of mitral stenosis.  6. Mild to moderate perivalvular aortic regurgitation unchanged from  prior. The aortic valve has been repaired/replaced. Aortic valve  regurgitation is mild to moderate. No aortic stenosis is present. There is  a 23 mm Edwards Sapien prosthetic (TAVR)  valve present in the aortic position. Procedure Date: 10/30/19. Aortic  regurgitation PHT measures 358 msec. Aortic valve mean gradient measures  10.0 mmHg. Aortic valve Vmax measures 2.11 m/s.  7. Aortic dilatation noted. There is mild dilatation of the ascending  aorta.  8. The inferior vena cava is normal in size with greater than 50%  respiratory variability, suggesting right atrial pressure of 3 mmHg.   Epic records are reviewed at length today  CHA2DS2-VASc Score = 7  The patient's score is based upon: CHF History: 0 HTN History: 1 Age : 2 Diabetes History: 0 Stroke History: 2 Vascular Disease History: 1 Gender: 1      ASSESSMENT AND PLAN: 1. Persistent Atrial Fibrillation (ICD10:  I48.19) The patient's CHA2DS2-VASc score is 7, indicating a 11.2% annual risk of stroke.   Now appears  to be persistent. We discussed therapeutic options today including DCCV. Given her symptoms, will proceed with TEE guided DCCV instead of waiting for 4 therapeutic INRs. INR on 06/06/20 was 3.1. If she has early return of her afib, she may need AAD. Recall patient has amiodarone listed as an allergy for 'thyroid, liver, and kidney problems'. Her AAD options may be limited with a CrCl of 28 mL/min. Will discuss with Dr Rayann Heman.  Continue warfarin Will increase Toprol to 75 mg daily until DCCV, then decrease back to 50 mg daily. Continue verapamil 120 mg BID  2. Secondary Hypercoagulable State (ICD10:  D68.69) The patient is at significant risk for stroke/thromboembolism based upon her CHA2DS2-VASc Score of 7.  Continue  Warfarin (Coumadin).   3. Tachybradycardia syndrome S/p PPM, followed by Dr Rayann Heman and the device clinic.  4. HTN Stable, med changes as above.   Follow up with Dr [name] as scheduled post DCCV. AF clinic in 2 months.    [facility] [address] [place], [place] [address] [phone] 06/10/2020 3:42 PM

## 2020-06-11 ENCOUNTER — Other Ambulatory Visit (HOSPITAL_COMMUNITY)
Admission: RE | Admit: 2020-06-11 | Discharge: 2020-06-11 | Disposition: A | Discharge status: Home / Self Care | Payer: Medicare Other | Source: Ambulatory Visit | Attending: Cardiology | Admitting: Cardiology

## 2020-06-11 DIAGNOSIS — Z20822 Contact with and (suspected) exposure to covid-19: Secondary | ICD-10-CM | POA: Insufficient documentation

## 2020-06-11 DIAGNOSIS — Z01812 Encounter for preprocedural laboratory examination: Secondary | ICD-10-CM | POA: Insufficient documentation

## 2020-06-11 LAB — SARS CORONAVIRUS 2 (TAT 6-24 HRS): SARS Coronavirus 2: NEGATIVE

## 2020-06-11 NOTE — Telephone Encounter (Signed)
Remote box connected as of 06/10/20. Patient seen in AF Clinic.

## 2020-06-11 NOTE — Progress Notes (Signed)
Remote pacemaker transmission.   

## 2020-06-12 NOTE — Progress Notes (Signed)
Cardiology Office Note   Date:  06/16/2020   ID:  [name], DOB 1934/07/23, MRN [id number]  PCP:  [name], MD  Cardiologist:  Jru Pense [name], MD EP: Thompson Grayer, MD  Chief Complaint  Patient presents with   Atrial Fibrillation   Aortic Stenosis      History of Present Illness: [name] is a 86 y.o. 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  She was  evaluated in December 2020 with  Repeat Echo study which showed EF 60-65%, moderate LVH, unable to assess LV diastolic function, no RWMA, mild LAE, mild MR, and severely calcified AV with suspected low-flow low gradient moderate-severe AS and moderate AI. She reported increased DOE requiring more lasix, as well at increased heart rates. On review of her echocardiogram, she was back in atrial fibrillation. She had increased edema.    Diagnostic cardiac catheterization was performed  on October 09, 2019 revealed nonobstructive coronary artery disease. Peak to peak and mean transvalvular gradients across aortic valve were measured 19 and 11.4 mmHg respectively corresponding to aortic valve area calculated only 0.84 cm.  The patient was evaluated by the multidisciplinary valve team and underwent successful TAVR with a45mm Edwards Sapien 3 UltraTHV via the left carotidapproach on 10/30/19. Post operative echoshowed EF 70% s/p TAVR with mild-mod PVL and mean gradient of 7 mm Hg. Of note, pre op labs showed a markedly elevated BNP >2000 and some mild pulmonary congestion on CXR. There was some concern for potential valve migration. However, the patient was feeling well and was discharged on POD 1 (10/31/19). She was discharged on coumadin  alone given high risk of bleed.   She subsequently called with  acutely worsening shortness of breath, chest pressure, weight gain and orthopnea. She did improve with increase in lasix to 40 mg daily.  Limited echo 11/05/19 showed stable mild-mod PVL. BP was high and amlodipine was added. When seen in valve clinic on March 18 she was in Afib. She underwent DCCV  12/05/19.   She was seen in May by Coletta Memos NP. Noted elevated BP and was having intermittent elevated heart rates. Metoprolol 50 mg was added. Pacer check in April showed no recurrent Afib.   More recently she was seen in the Afib clinic by Alden Benjamin PA on 06/09/20. The device clinic received an alert for an ongoing afib episode starting 06/04/20 with rapid rates. She has noted symptoms of increased fatigue, dyspnea with exertion, and palpitations. There were no triggers that she could identify. She underwent TEE guided DCCV on 06/13/20. TEE showed mild to moderate AI. Normal LV function. No thrombus.  On follow up today she is seen with her daughter. She states she can tell she feels much better since her cardioversion. No dyspnea, palpitations, chest pain. BP has been persistently high.   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    Anxiety    Aortic root dilatation (HCC)    Arthritis    oa   Bladder cancer ([place])    Blind left eye 2015  partial blind in left eye due to stroke    Bright disease as child   CAD (coronary artery disease)    Cataracts, bilateral    Chronic kidney disease    only has one kidney rt lft rem 03 ca     dr. Risa Grill, bladder cancer 05-04-2018   Elevated LFTs    hepatic steatosis, none recent   Endometrial cancer ([place])    Gastroparesis    GERD (gastroesophageal reflux disease)    Hearing loss    wears hearing aids   Heart  murmur    History of uterine cancer 1980s   treated with hysterectomy, external radiation and radiation seed implants.    HTN (hypertension)    Hyperlipemia    Hypothyroidism    Iron deficiency anemia due to chronic blood loss 11/24/2015   Neuropathy    right leg   Osteomyelitis (HCC) as child   PAF (paroxysmal atrial fibrillation) ([place])    Presence of permanent cardiac pacemaker    Refusal of blood transfusions as patient is Jehovah's Witness    Restless leg syndrome    S/P TAVR (transcatheter aortic valve replacement) 10/30/2019   23 mm Edwards Sapien 3 transcatheter heart valve placed via left transcarotid approach    Severe aortic stenosis    Stroke ([place]) 2015   partial stroke left legally blind; left eye    Tachycardia-bradycardia syndrome ([place])    s/p PPM by Dr Doreatha Lew (MDT) 07/03/10   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 ENDOSCOPY;  Service: Cardiovascular;  Laterality: N/A;   CHOLECYSTECTOMY  1970s   COLONOSCOPY N/A 11/11/2015   Procedure: COLONOSCOPY;  Surgeon: [name], MD;  Location: [facility] ENDOSCOPY;  Service: Endoscopy;  Laterality: N/A;   CYSTOSCOPY W/ RETROGRADES Right 05/01/2018   Procedure: CYSTOSCOPY WITH RIGHT RETROGRADE PYELOGRAM;  Surgeon: Lucas Mallow, MD;  Location: [facility] ORS;  Service: Urology;  Laterality: Right;   CYSTOSCOPY W/ RETROGRADES Right 07/21/2018   Procedure: CYSTOSCOPY WITH RIGHT RETROGRADE PYELOGRAM;  Surgeon: Alexis Frock, MD;  Location: [facility] ORS;  Service: Urology;   Laterality: Right;   ESOPHAGOGASTRODUODENOSCOPY N/A 11/11/2015   Procedure: ESOPHAGOGASTRODUODENOSCOPY (EGD);  Surgeon: [name], MD;  Location: [facility] ENDOSCOPY;  Service: Endoscopy;  Laterality: N/A;   ESOPHAGOGASTRODUODENOSCOPY N/A 02/05/2016   Procedure: ESOPHAGOGASTRODUODENOSCOPY (EGD);  Surgeon: Ladene Artist, MD;  Location: [facility] ENDOSCOPY;  Service: Endoscopy;  Laterality: N/A;   ESOPHAGOGASTRODUODENOSCOPY N/A 04/02/2016   Procedure: ESOPHAGOGASTRODUODENOSCOPY (EGD);  Surgeon: [name], MD;  Location: [facility] ENDOSCOPY;  Service: Endoscopy;  Laterality: N/A;   ESOPHAGOGASTRODUODENOSCOPY (EGD) WITH PROPOFOL N/A 04/06/2017   Procedure: ESOPHAGOGASTRODUODENOSCOPY (EGD) WITH PROPOFOL;  Surgeon: [name], MD;  Location: [facility] ENDOSCOPY;  Service: Endoscopy;  Laterality: N/A;   EYE SURGERY Bilateral    remove cataracts   GASTRECTOMY N/A 01/15/2016   Procedure: OPEN PARTIAL GASTRECTOMY;  Surgeon: Stark Klein, MD;  Location: [place];  Service: General;  Laterality: N/A;   GASTRECTOMY Left 02/24/2016   Procedure: OPEN J TUBE;  Surgeon: Stark Klein, MD;  Location: [place];  Service: General;  Laterality: Left;   GASTROJEJUNOSTOMY N/A 05/18/2016   Procedure: ROUX EN Y GASTROJEJUNOSTOMY;  Surgeon: Stark Klein, MD;  Location: [place];  Service: General;  Laterality: N/A;   I & D KNEE WITH POLY EXCHANGE Right 12/17/2013   Procedure: IRRIGATION AND DEBRIDEMEN RIGHT TOTAL  KNEE WITH POLY EXCHANGE;  Surgeon: Mauri Pole, MD;  Location: [facility]S;  Service: Orthopedics;  Laterality: Right;   IR GENERIC HISTORICAL  06/17/2016   IR RADIOLOGIST EVAL & MGMT 06/17/2016 Ascencion Dike, PA-C [facility] INTERV RAD   LAPAROSCOPY N/A 01/15/2016   Procedure: LAPAROSCOPY DIAGNOSTIC;  Surgeon: Stark Klein, MD;  Location: [place];  Service: General;  Laterality: N/A;   LYSIS OF ADHESION  05/18/2016   Procedure: LYSIS OF ADHESION;  Surgeon: Stark Klein, MD;  Location: [place];  Service: General;;   NEPHRECTOMY Left     renal cell cancer   PACEMAKER INSERTION  07/04/2011   MDT PPM implanted by Dr Doreatha Lew for tachy/brady; managed by Dr Thompson Grayer    PATELLAR TENDON REPAIR Right 03/06/2014   Procedure: [place];  Surgeon: Mauri Pole, MD;  Location: [facility];  Service: Orthopedics;  Laterality: Right;   PATENT DUCTUS ARTERIOUS REPAIR  ~ 1949   RIGHT/LEFT HEART CATH AND CORONARY ANGIOGRAPHY N/A 10/09/2019   Procedure: RIGHT/LEFT HEART CATH AND CORONARY ANGIOGRAPHY;  Surgeon: [name], Audra Kagel M, MD;  Location: [place] CV LAB;  Service: Cardiovascular;  Laterality: N/A;   TEE WITHOUT CARDIOVERSION N/A 10/30/2019   Procedure: TRANSESOPHAGEAL ECHOCARDIOGRAM (TEE);  Surgeon: Burnell Blanks, MD;  Location: [place];  Service: Open Heart Surgery;  Laterality: N/A;   TEE WITHOUT CARDIOVERSION N/A 06/13/2020   Procedure: TRANSESOPHAGEAL ECHOCARDIOGRAM (TEE);  Surgeon: Buford Dresser, MD;  Location[facility] ENDOSCOPY;  Service: Cardiovascular;  Laterality: N/A;   TOTAL ABDOMINAL HYSTERECTOMY  85   TOTAL HIP REVISION Right 2009   initial replacment surg   TOTAL KNEE ARTHROPLASTY  02/07/2012   Procedure: TOTAL KNEE ARTHROPLASTY;  Surgeon: Mauri Pole, MD;  Location: [facility];  Service: Orthopedics;  Laterality: Right;   TOTAL KNEE REVISION Right 07/29/2014   Procedure: RIGHT KNEE REVISION OF PREVIOUS REPAIR EXTENSOR MECHANISM;  Surgeon: Mauri Pole, MD;  Location: [facility];  Service: Orthopedics;  Laterality: Right;   TRANSCATHETER AORTIC VALVE REPLACEMENT, CAROTID Left 10/30/2019   TRANSCATHETER AORTIC VALVE REPLACEMENT, LEFT CAROTID (Left Neck)    TRANSCATHETER AORTIC VALVE REPLACEMENT, CAROTID Left 10/30/2019   Procedure: TRANSCATHETER AORTIC VALVE REPLACEMENT, LEFT CAROTID;  Surgeon: Burnell Blanks, MD;  Location: [place];  Service: Open Heart Surgery;  Laterality: Left;   TRANSURETHRAL RESECTION OF BLADDER TUMOR N/A 07/21/2018   Procedure: TRANSURETHRAL RESECTION OF  BLADDER TUMOR (TURBT);  Surgeon: Alexis Frock, MD;  Location[facility];  Service: Urology;  Laterality: N/A;   TRANSURETHRAL RESECTION OF BLADDER TUMOR WITH MITOMYCIN-C N/A 05/01/2018   Procedure: TRANSURETHRAL RESECTION OF BLADDER TUMOR WITH POST OP INSTILLATION OF GEMCITABINE;  Surgeon: Lucas Mallow, MD;  Location: [facility];  Service: Urology;  Laterality: N/A;   WHIPPLE PROCEDURE N/A 05/18/2016   Procedure: EXPLORATORY LAPAROTOMY;  Surgeon: Stark Klein, MD;  Location: [place];  Service: General;  Laterality: N/A;     Current Outpatient Medications  Medication Sig Dispense Refill   acetaminophen (TYLENOL) 500 MG tablet Take 1,000 mg by mouth at bedtime.      ALPRAZolam (XANAX) 0.5 MG tablet Take 0.5 mg by mouth 2 (two) times  daily as needed for anxiety.      Alum & Mag Hydroxide-Simeth (MYLANTA PO) Take 20 mLs by mouth every evening.      amoxicillin (AMOXIL) 500 MG tablet Take 4 tablets (2,000 mg total) by mouth as directed. 1 hour prior to dental work including cleanings 12 tablet 12   Ascorbic Acid (VITAMIN C) 1000 MG tablet Take 1,000 mg by mouth daily.     Cholecalciferol (VITAMIN D-3) 5000 units TABS Take 5,000 Units by mouth 3 (three) times daily.      Cyanocobalamin 5000 MCG SUBL Place 5,000 mcg under the tongue daily.      erythromycin ophthalmic ointment Place 1 application into both eyes at bedtime as needed (stye).      famotidine (PEPCID) 40 MG tablet Take 1 tablet Daily for Indigestion & Heartburn (Patient taking differently: Take 40 mg by mouth daily as needed for heartburn or indigestion. ) 90 tablet 3   ferrous sulfate 325 (65 FE) MG tablet Take 325 mg by mouth daily in the afternoon.     furosemide (LASIX) 20 MG tablet Take 1 tablet (20 mg total) by mouth daily. 90 tablet 3   gabapentin (NEURONTIN) 300 MG capsule TAKE 1 CAPSULE BY MOUTH THREE TIMES A DAY (Patient taking differently: Take 300 mg by mouth in the morning and at bedtime. ) 270 capsule 1    hydrocortisone (ANUSOL-HC) 2.5 % rectal cream Place rectally 2 (two) times daily. 30 g 1   levothyroxine (SYNTHROID) 100 MCG tablet TAKE 1/2 TABLET DAILY FOR THYROID. (Patient taking differently: Take 50 mcg by mouth daily before breakfast. ) 45 tablet 1   loperamide (IMODIUM A-D) 2 MG tablet Take up to 12 tablets /day as needed for  for Diarrhea (Patient taking differently: Take 2-4 mg by mouth 4 (four) times daily as needed for diarrhea or loose stools. ) 120 tablet 11   loratadine (CLARITIN) 10 MG tablet Take 10 mg by mouth at bedtime.      metoprolol succinate (TOPROL-XL) 50 MG 24 hr tablet Take 1 tablet (50 mg total) by mouth daily for 30 doses. Take with or immediately following a meal. (Patient taking differently: Take 75 mg by mouth daily in the afternoon. Take with or immediately following a meal.) 30 tablet 6   nitroGLYCERIN (NITROSTAT) 0.4 MG SL tablet Dissolve 1 tablet under tongue every 5 minutes if needed for Angina (Patient taking differently: Place 0.4 mg under the tongue every 5 (five) minutes x 3 doses as needed for chest pain. ) 50 tablet prn   ondansetron (ZOFRAN-ODT) 8 MG disintegrating tablet Dissolve 1 tablet under tongue every 6 to 8 hours  3 x /day if needed for Nausea or Vomitting 30 tablet 3   potassium chloride (KLOR-CON) 10 MEQ tablet Take 1 tablet (10 mEq total) by mouth daily. 90 tablet 3   Propylene Glycol (SYSTANE COMPLETE) 0.6 % SOLN Place 1 drop into both eyes 3 (three) times daily as needed (irritation/dry eyes.).     simethicone (MYLICON) 80 MG chewable tablet Chew 80 mg by mouth every 6 (six) hours as needed for flatulence.     sucralfate (CARAFATE) 1 g tablet Take 1-2 g by mouth See admin instructions. Take 2 tablets (2 g) by mouth in the morning & take 1 tablet (1g) by mouth at lunch     tobramycin-dexamethasone (TOBRADEX) ophthalmic solution Place 1 drop into both eyes daily as needed (stye).      triamcinolone cream (KENALOG) 0.1 % Apply 1 application  topically 2 (two) times daily as needed (rash/skin irritation.).      verapamil (CALAN-SR) 120 MG CR tablet TAKE 1 TABLET 2 X /DAY WITH A MEAL FOR BP & HEART RHYTHM (Patient taking differently: Take 120 mg by mouth in the morning and at bedtime. Take 1 tablet 2 x /day with a meal for BP & Heart Rhythm) 180 tablet 3   warfarin (COUMADIN) 2 MG tablet TAKE 1 TO 2 TABLETS AS DIRECTED TO PREVENT BLOOD CLOTS (Patient taking differently: Take 2 mg by mouth every evening. ) 180 tablet 1   losartan (COZAAR) 25 MG tablet Take 1 tablet (25 mg total) by mouth daily. 90 tablet 3   No current facility-administered medications for this visit.    Allergies:   Amiodarone, Adhesive [tape], Hydrocodone, Other, Reglan [metoclopramide], Remeron [mirtazapine], Codeine, Morphine and related, Requip [ropinirole hcl], and Zinc    Social History:  The patient  reports that she has never smoked. She has never used smokeless tobacco. She reports that she does not drink alcohol and does not use drugs.   Family History:  The patient's family history includes Colon cancer (age of onset: 11) in her mother; Heart attack in her father; Heart disease in her father, maternal grandmother, and mother; Stomach cancer in her brother.    ROS:  Please see the history of present illness.   Otherwise, review of systems are positive for none.   All other systems are reviewed and negative.    PHYSICAL EXAM: VS:  BP (!) 158/66    Pulse 66    Ht 4\' 11"  (1.499 m)    Wt 99 lb 3.2 oz (45 kg)    LMP  (LMP Unknown)    SpO2 95%    BMI 20.04 kg/m  , BMI Body mass index is 20.04 kg/m. GEN: Well nourished, elderly WF, in no acute distress. Ambulating with a walker.  HEENT: sclera anicteric Neck: no JVD, carotid bruits, or masses Cardiac: RRR; gr 1/6 diastolic murmur LSB. Respiratory:  clear to auscultation bilaterally, normal work of breathing GI: soft, nontender, nondistended, + BS MS: no deformity or atrophy. Pedal pulse 2+ on the left  and absent on the right. Skin: warm and dry, no rash, trace edema in feet.  Neuro:  Strength and sensation are intact Psych: euthymic mood, full affect   EKG:  EKG is not ordered today.    Recent Labs: 10/26/2019: B Natriuretic Peptide 2,138.2 02/12/2020: ALT 22; Magnesium 1.9; TSH 1.50 06/10/2020: BUN 18; Creatinine, Ser 1.06; Hemoglobin 12.9; Platelets 160; Potassium 3.5; Sodium 144    Lipid Panel    Component Value Date/Time   CHOL 135 02/12/2020 1414   TRIG 114 02/12/2020 1414   HDL 41 (L) 02/12/2020 1414   CHOLHDL 3.3 02/12/2020 1414   VLDL 29 03/23/2017 1445   LDLCALC 74 02/12/2020 1414      Wt Readings from Last 3 Encounters:  06/16/20 99 lb 3.2 oz (45 kg)  06/13/20 97 lb 6.4 oz (44.2 kg)  06/10/20 97 lb 6.4 oz (44.2 kg)      Other studies Reviewed: Additional studies/ records that were reviewed today include:   NST 2017:   The left ventricular ejection fraction is normal (55-65%).  Nuclear stress EF: 62%.  The study is normal.  Normal stress nuclear study with no ischemia or infarction; EF 62 with normal wall motion; note patient in atrial flutter at time of study.  Echocardiogram 09/17/19: 1. Left ventricular ejection fraction, by visual estimation, is 60  to 65%. The left ventricle has normal function. There is moderately increased left ventricular hypertrophy.  2. Left ventricular diastolic function could not be evaluated.  3. Small left ventricular internal cavity size.  4. The left ventricle has no regional wall motion abnormalities.  5. Global right ventricle has normal systolic function.The right ventricular size is normal. No increase in right ventricular wall thickness.  6. Left atrial size was moderately dilated.  7. The mitral valve is degenerative. Mild mitral valve regurgitation.  8. The tricuspid valve is grossly normal.  9. The aortic valve is severely calcified with restricted leaflet motion. Vmax 3.3 m/s, mean gradient 25 mmHG, DI 0.21,  AVA 0.60 cm2. SV=43 cc and SVi=31 cc/m2. Suspect there is paradoxical low-flow low-gradient severe aortic stenosis. Moderate aortic  regurgitation is present. Recommend an aortic valve calcium score for clarification of AoV stenosis severity. 10. Aortic valve regurgitation is moderate. 11. The aortic valve is tricuspid. Aortic valve regurgitation is moderate. Moderate to severe aortic valve stenosis. 12. Mildly elevated pulmonary artery systolic pressure. 13. The tricuspid regurgitant velocity is 2.68 m/s, and with an assumed right atrial pressure of 15 mmHg, the estimated right ventricular systolic pressure is mildly elevated at 43.8 mmHg. 14. A pacer wire is visualized in the RA and RV. 15. The inferior vena cava is dilated in size with <50% respiratory variability, suggesting right atrial pressure of 15 mmHg.  TAVR OPERATIVE NOTE   Date of Procedure:10/30/2019  Preoperative Diagnosis:Severe Aortic Stenosis   Postoperative Diagnosis:Same   Procedure:   Transcatheter Aortic Valve Replacement - OpenLeftTranscarotid Approach Edwards Sapien 3 THV (size 39mm, model # 9600TFX, serial # [id number])  Co-Surgeons:Clarence H. Roxy Manns, MD and [name], MD  Anesthesiologist:Chris Ermalene Postin, MD  Dala Dock, MD  Pre-operative Echo Findings: ? Paradoxical Low Flow with Normal EFSevere aortic stenosis ? Normalleft ventricular systolic function  Post-operative Echo Findings: ? Mildparavalvular leak ? Normalleft ventricular systolic function  _____________  Echo2/24/21: IMPRESSIONS 1. Left ventricular ejection fraction, by estimation, is 70 to 75%. The  left ventricle has hyperdynamic function. The left ventricle has no  regional wall motion abnormalities. There is moderate concentric left  ventricular hypertrophy. Left ventricular   diastolic function could not be evaluated.  2. Right ventricular systolic function is normal. The right ventricular  size is normal. There is moderately elevated pulmonary artery systolic  pressure. The estimated right ventricular systolic pressure is 15.4 mmHg.  3. Left atrial size was mildly dilated.  4. Right atrial size was mildly dilated.  5. Large pleural effusion in the left lateral region.  6. The mitral valve is normal in structure and function. Moderate mitral  valve regurgitation. No evidence of mitral stenosis.  7. There is mild paravalvular leak (at 5'clock on short axis), most  prominent on images 82, 83 where it appears mild to moderate.   Normal peak/mean transaortic gradients 15/7 mmHg.. The aortic valve  has been repaired/replaced. Aortic valve regurgitation is not visualized.  No aortic stenosis is present. Echo findings are consistent with  perivalvular leak of the aortic prosthesis.  Aortic valve mean gradient measures 7.0 mmHg.  8. The inferior vena cava is normal in size with greater than 50%  respiratory variability, suggesting right atrial pressure of 3 mmHg.   ___________________  Limited echo 11/05/19: IMPRESSIONS 1. Left ventricular ejection fraction, by estimation, is 60 to 65%. The  left ventricle has normal function. The left ventricle has no regional  wall motion abnormalities. There is moderate left ventricular hypertrophy.  Left  ventricular diastolic  parameters are indeterminate.  2. Moderate pleural effusion in the left lateral region.  3. The mitral valve is degenerative. Moderate mitral annular  calcifications. Moderate mitral valve regurgitation.  4. Aortic dilatation noted. There is dilatation of the ascending aorta  measuring 40 mm.  5. Tricuspid valve regurgitation is mild to moderate.  6. Right ventricular systolic function is mildly reduced. The right  ventricular size is normal. There is moderately elevated pulmonary  artery  systolic pressure. The estimated right ventricular systolic pressure is  50.3 mmHg.  7. The inferior vena cava is normal in size with <50% respiratory  variability, suggesting right atrial pressure of 8 mmHg.  8. There is a 23 mm Edwards Sapien prosthetic (TAVR) valve present in the  aortic position. Procedure Date: 10/30/19. Mean gradient 51mmHg. Echo  findings are consistent with perivalvular leak of the aortic prosthesis.  Paravalvular leak appears  mild-moderate, unchanged from prior TTE on 2/24.   Comparison(s): 10/31/19 EF 70-75%. PA 43mmHg. AV 62mmHg mean, 32mmhg peak. Mild-moderate perivalvular AI.   ___________________  Echo 11/22/19 IMPRESSIONS  1. Left ventricular ejection fraction, by estimation, is 55 to 60%. The  left ventricle has normal function. The left ventricle has no regional  wall motion abnormalities. There is mild concentric left ventricular  hypertrophy. Left ventricular diastolic  parameters are indeterminate. Elevated left ventricular end-diastolic  pressure.  2. Right ventricular systolic function is normal. The right ventricular  size is normal. There is mildly elevated pulmonary artery systolic  pressure.  3. Left atrial size was severely dilated.  4. Right atrial size was severely dilated.  5. The mitral valve is normal in structure. Mild mitral valve  regurgitation. No evidence of mitral stenosis.  6. Mild to moderate perivalvular aortic regurgitation unchanged from  prior. The aortic valve has been repaired/replaced. Aortic valve  regurgitation is mild to moderate. No aortic stenosis is present. There is  a 23 mm Edwards Sapien prosthetic (TAVR)  valve present in the aortic position. Procedure Date: 10/30/19. Aortic  regurgitation PHT measures 358 msec. Aortic valve mean gradient measures  10.0 mmHg. Aortic valve Vmax measures 2.11 m/s.  7. Aortic dilatation noted. There is mild dilatation of the ascending  aorta.  8. The inferior  vena cava is normal in size with greater than 50%  respiratory variability, suggesting right atrial pressure of 3 mmHg.    ASSESSMENT AND PLAN:   1. Chronic diastolic CHF: secondary to combined Afib, AS. Weight is stable She has no edema on exam. Clinically she is class 2.  Recommend continuing on lasix 20 mg daily.   She reports improvement of symptoms with TAVR, diuresis and restoration of NSR.    2. Paroxysmal atrial fibrillation:  S/p DCCV in March 2021. Recurrent in September 2021. Now s/p TEE guided DCCV on 06/13/20 - in NSR by exam today - Continue coumadin for stroke ppx - Continue metoprolol  75 mg BID - Continue verapamil at current dose - she has a higher risk for recurrent Afib. Will keep appointment. If AFib recurs will need Dr Jackalyn Lombard input. She has a history of intolerance to amiodarone.   3. Aortic stenosis. S/p TAVR in February:  She has mild to moderate perivalvular lead/AI. Followed by valve clinic.  - on Coumadin due to Afib. No antiplatelet therapy due to needing to be on Coumadin   4. HTN: BP is elevated.  - Continue metoprolol,  and verapamil  - will add losartan 25 mg daily.  - check BMET  in one week. d  5. Tachy-brady syndrome s/p PPM: device interrogation with normal function; Follow up in Afib   6. Carotid artery disease: mild stenosis noted on carotid dopplers 08/15/2019 - Recommend annual follow-up doppler studies  7. Thoracic aortic aneurysm. 4.0 cm ectatic aorta. Will follow over time.   8. PAD pre TAVR CT showed severe aortic and ileofemoral atherosclerosis with multifocal stenosis in the right external iliac. She does have chronic claudication symptoms in her right > left leg. No critical limb ischemia.  She had some rectal bleeding related to hemorrhoids. This has resolved. She did have a colon polyps removed. If we can have a period of stability without Afib or bleeding we could consider having her see Dr Gwenlyn Found.  Intervention for her PAD May  require antiplatelet therapy and therefore bleeding risk would be high.   I will follow up in 4 months. She has follow up in the Afib clinic in Dec.    Signed, Eris Breck [name], MD  06/16/2020 10:32 AM

## 2020-06-13 ENCOUNTER — Encounter (HOSPITAL_COMMUNITY)
Admission: RE | Disposition: A | Discharge status: Home / Self Care | Payer: Self-pay | Source: Home / Self Care | Attending: Cardiology

## 2020-06-13 ENCOUNTER — Ambulatory Visit (HOSPITAL_BASED_OUTPATIENT_CLINIC_OR_DEPARTMENT_OTHER)
Admission: RE | Admit: 2020-06-13 | Discharge: 2020-06-13 | Disposition: A | Discharge status: Home / Self Care | Payer: Medicare Other | Source: Ambulatory Visit | Attending: Physician Assistant | Admitting: Physician Assistant

## 2020-06-13 ENCOUNTER — Ambulatory Visit (INDEPENDENT_AMBULATORY_CARE_PROVIDER_SITE_OTHER): Payer: Medicare Other

## 2020-06-13 ENCOUNTER — Encounter (HOSPITAL_COMMUNITY): Payer: Self-pay | Admitting: Cardiology

## 2020-06-13 ENCOUNTER — Ambulatory Visit (HOSPITAL_COMMUNITY): Payer: Medicare Other | Admitting: Certified Registered Nurse Anesthetist

## 2020-06-13 ENCOUNTER — Ambulatory Visit (HOSPITAL_COMMUNITY)
Admission: RE | Admit: 2020-06-13 | Discharge: 2020-06-13 | Disposition: A | Discharge status: Home / Self Care | Payer: Medicare Other | Attending: Cardiology | Admitting: Cardiology

## 2020-06-13 ENCOUNTER — Other Ambulatory Visit: Payer: Self-pay

## 2020-06-13 DIAGNOSIS — G2581 Restless legs syndrome: Secondary | ICD-10-CM | POA: Insufficient documentation

## 2020-06-13 DIAGNOSIS — N183 Chronic kidney disease, stage 3 unspecified: Secondary | ICD-10-CM | POA: Diagnosis not present

## 2020-06-13 DIAGNOSIS — I2510 Atherosclerotic heart disease of native coronary artery without angina pectoris: Secondary | ICD-10-CM | POA: Diagnosis not present

## 2020-06-13 DIAGNOSIS — J301 Allergic rhinitis due to pollen: Secondary | ICD-10-CM | POA: Diagnosis not present

## 2020-06-13 DIAGNOSIS — E875 Hyperkalemia: Secondary | ICD-10-CM | POA: Diagnosis not present

## 2020-06-13 DIAGNOSIS — Z8249 Family history of ischemic heart disease and other diseases of the circulatory system: Secondary | ICD-10-CM | POA: Insufficient documentation

## 2020-06-13 DIAGNOSIS — Z8551 Personal history of malignant neoplasm of bladder: Secondary | ICD-10-CM | POA: Insufficient documentation

## 2020-06-13 DIAGNOSIS — Z7901 Long term (current) use of anticoagulants: Secondary | ICD-10-CM

## 2020-06-13 DIAGNOSIS — H548 Legal blindness, as defined in USA: Secondary | ICD-10-CM | POA: Insufficient documentation

## 2020-06-13 DIAGNOSIS — Z905 Acquired absence of kidney: Secondary | ICD-10-CM | POA: Diagnosis not present

## 2020-06-13 DIAGNOSIS — I69398 Other sequelae of cerebral infarction: Secondary | ICD-10-CM | POA: Insufficient documentation

## 2020-06-13 DIAGNOSIS — G5791 Unspecified mononeuropathy of right lower limb: Secondary | ICD-10-CM | POA: Diagnosis not present

## 2020-06-13 DIAGNOSIS — I495 Sick sinus syndrome: Secondary | ICD-10-CM | POA: Diagnosis not present

## 2020-06-13 DIAGNOSIS — I480 Paroxysmal atrial fibrillation: Secondary | ICD-10-CM | POA: Diagnosis not present

## 2020-06-13 DIAGNOSIS — H269 Unspecified cataract: Secondary | ICD-10-CM | POA: Insufficient documentation

## 2020-06-13 DIAGNOSIS — Z5181 Encounter for therapeutic drug level monitoring: Secondary | ICD-10-CM | POA: Diagnosis not present

## 2020-06-13 DIAGNOSIS — I4891 Unspecified atrial fibrillation: Secondary | ICD-10-CM | POA: Diagnosis not present

## 2020-06-13 DIAGNOSIS — I4819 Other persistent atrial fibrillation: Secondary | ICD-10-CM | POA: Insufficient documentation

## 2020-06-13 DIAGNOSIS — H5462 Unqualified visual loss, left eye, normal vision right eye: Secondary | ICD-10-CM | POA: Diagnosis not present

## 2020-06-13 DIAGNOSIS — I129 Hypertensive chronic kidney disease with stage 1 through stage 4 chronic kidney disease, or unspecified chronic kidney disease: Secondary | ICD-10-CM | POA: Insufficient documentation

## 2020-06-13 DIAGNOSIS — E039 Hypothyroidism, unspecified: Secondary | ICD-10-CM | POA: Diagnosis not present

## 2020-06-13 DIAGNOSIS — H6523 Chronic serous otitis media, bilateral: Secondary | ICD-10-CM | POA: Diagnosis not present

## 2020-06-13 DIAGNOSIS — Z800 Family history of malignant neoplasm of digestive organs: Secondary | ICD-10-CM | POA: Insufficient documentation

## 2020-06-13 DIAGNOSIS — Z950 Presence of cardiac pacemaker: Secondary | ICD-10-CM | POA: Diagnosis not present

## 2020-06-13 DIAGNOSIS — M1990 Unspecified osteoarthritis, unspecified site: Secondary | ICD-10-CM | POA: Insufficient documentation

## 2020-06-13 DIAGNOSIS — K3184 Gastroparesis: Secondary | ICD-10-CM | POA: Diagnosis not present

## 2020-06-13 DIAGNOSIS — N189 Chronic kidney disease, unspecified: Secondary | ICD-10-CM | POA: Diagnosis not present

## 2020-06-13 DIAGNOSIS — Z96651 Presence of right artificial knee joint: Secondary | ICD-10-CM | POA: Diagnosis not present

## 2020-06-13 DIAGNOSIS — Z79899 Other long term (current) drug therapy: Secondary | ICD-10-CM | POA: Insufficient documentation

## 2020-06-13 DIAGNOSIS — I080 Rheumatic disorders of both mitral and aortic valves: Secondary | ICD-10-CM | POA: Insufficient documentation

## 2020-06-13 DIAGNOSIS — Z85038 Personal history of other malignant neoplasm of large intestine: Secondary | ICD-10-CM | POA: Diagnosis not present

## 2020-06-13 DIAGNOSIS — Z885 Allergy status to narcotic agent status: Secondary | ICD-10-CM | POA: Diagnosis not present

## 2020-06-13 DIAGNOSIS — D6869 Other thrombophilia: Secondary | ICD-10-CM | POA: Insufficient documentation

## 2020-06-13 DIAGNOSIS — I351 Nonrheumatic aortic (valve) insufficiency: Secondary | ICD-10-CM | POA: Diagnosis not present

## 2020-06-13 DIAGNOSIS — I272 Pulmonary hypertension, unspecified: Secondary | ICD-10-CM | POA: Diagnosis not present

## 2020-06-13 DIAGNOSIS — I5033 Acute on chronic diastolic (congestive) heart failure: Secondary | ICD-10-CM | POA: Diagnosis not present

## 2020-06-13 DIAGNOSIS — K219 Gastro-esophageal reflux disease without esophagitis: Secondary | ICD-10-CM | POA: Diagnosis not present

## 2020-06-13 DIAGNOSIS — Z79890 Hormone replacement therapy (postmenopausal): Secondary | ICD-10-CM | POA: Diagnosis not present

## 2020-06-13 DIAGNOSIS — I340 Nonrheumatic mitral (valve) insufficiency: Secondary | ICD-10-CM | POA: Diagnosis not present

## 2020-06-13 DIAGNOSIS — Z8589 Personal history of malignant neoplasm of other organs and systems: Secondary | ICD-10-CM | POA: Insufficient documentation

## 2020-06-13 DIAGNOSIS — E559 Vitamin D deficiency, unspecified: Secondary | ICD-10-CM | POA: Diagnosis not present

## 2020-06-13 DIAGNOSIS — Z888 Allergy status to other drugs, medicaments and biological substances status: Secondary | ICD-10-CM | POA: Diagnosis not present

## 2020-06-13 DIAGNOSIS — E785 Hyperlipidemia, unspecified: Secondary | ICD-10-CM | POA: Insufficient documentation

## 2020-06-13 DIAGNOSIS — D649 Anemia, unspecified: Secondary | ICD-10-CM | POA: Diagnosis not present

## 2020-06-13 DIAGNOSIS — Z85028 Personal history of other malignant neoplasm of stomach: Secondary | ICD-10-CM | POA: Diagnosis not present

## 2020-06-13 DIAGNOSIS — I130 Hypertensive heart and chronic kidney disease with heart failure and stage 1 through stage 4 chronic kidney disease, or unspecified chronic kidney disease: Secondary | ICD-10-CM | POA: Diagnosis not present

## 2020-06-13 DIAGNOSIS — Z8553 Personal history of malignant neoplasm of renal pelvis: Secondary | ICD-10-CM | POA: Insufficient documentation

## 2020-06-13 DIAGNOSIS — I081 Rheumatic disorders of both mitral and tricuspid valves: Secondary | ICD-10-CM | POA: Diagnosis not present

## 2020-06-13 DIAGNOSIS — Z952 Presence of prosthetic heart valve: Secondary | ICD-10-CM | POA: Insufficient documentation

## 2020-06-13 HISTORY — PX: CARDIOVERSION: SHX1299

## 2020-06-13 HISTORY — PX: TEE WITHOUT CARDIOVERSION: SHX5443

## 2020-06-13 LAB — POCT INR: INR: 2.5 (ref 2.0–3.0)

## 2020-06-13 SURGERY — ECHOCARDIOGRAM, TRANSESOPHAGEAL
Anesthesia: General

## 2020-06-13 MED ORDER — PROPOFOL 500 MG/50ML IV EMUL
INTRAVENOUS | Status: DC | PRN
  Administered 2020-06-13: 75 ug/kg/min via INTRAVENOUS

## 2020-06-13 MED ORDER — PHENYLEPHRINE 40 MCG/ML (10ML) SYRINGE FOR IV PUSH (FOR BLOOD PRESSURE SUPPORT)
PREFILLED_SYRINGE | INTRAVENOUS | Status: DC | PRN
  Administered 2020-06-13: 40 ug via INTRAVENOUS

## 2020-06-13 MED ORDER — LACTATED RINGERS IV SOLN
INTRAVENOUS | Status: AC | PRN
  Administered 2020-06-13: 1000 mL via INTRAVENOUS

## 2020-06-13 MED ORDER — PROPOFOL 10 MG/ML IV BOLUS
INTRAVENOUS | Status: DC | PRN
  Administered 2020-06-13: 20 mg via INTRAVENOUS
  Administered 2020-06-13: 25 mg via INTRAVENOUS

## 2020-06-13 MED ORDER — LACTATED RINGERS IV SOLN: INTRAVENOUS | Status: DC | PRN

## 2020-06-13 MED ORDER — SODIUM CHLORIDE 0.9 % IV SOLN: INTRAVENOUS | Status: DC

## 2020-06-13 MED ORDER — BUTAMBEN-TETRACAINE-BENZOCAINE 2-2-14 % EX AERO
INHALATION_SPRAY | CUTANEOUS | Status: DC | PRN
  Administered 2020-06-13: 2 via TOPICAL

## 2020-06-13 NOTE — Transfer of Care (Signed)
Immediate Anesthesia Transfer of Care Note  Patient: [name]  Procedure(s) Performed: TRANSESOPHAGEAL ECHOCARDIOGRAM (TEE) (N/A ) CARDIOVERSION (N/A )  Patient Location: Endoscopy Unit  Anesthesia Type:General  Level of Consciousness: awake and alert   Airway & Oxygen Therapy: Patient Spontanous Breathing  Post-op Assessment: Report given to RN and Post -op Vital signs reviewed and stable  Post vital signs: Reviewed and stable  Last Vitals:  Vitals Value Taken Time  BP 152/42 06/13/20 1230  Temp    Pulse 65 06/13/20 1233  Resp 18 06/13/20 1233  SpO2 96 % 06/13/20 1233  Vitals shown include unvalidated device data.  Last Pain:  Vitals:   06/13/20 1108  TempSrc: Oral  PainSc: 0-No pain         Complications: No complications documented.

## 2020-06-13 NOTE — Patient Instructions (Signed)
Description   Spoke with Lawerance Bach, [facility] RN while in pt's home, advised for pt to continue on same dosage 1 tablet daily. Pt is being cardioverted today.  Recheck INR in 1 week- by Home Health.   Call us with any medication changes or concerns # [phone]. [place]'s # [phone].

## 2020-06-13 NOTE — Anesthesia Procedure Notes (Addendum)
Procedure Name: General with mask airway Date/Time: 06/13/2020 11:54 AM Performed by: Reece Agar, CRNA Pre-anesthesia Checklist: Patient identified, Emergency Drugs available, Suction available, Patient being monitored and Timeout performed Patient Re-evaluated:Patient Re-evaluated prior to induction Oxygen Delivery Method: Ambu bag and Nasal cannula Preoxygenation: Pre-oxygenation with 100% oxygen Induction Type: IV induction

## 2020-06-13 NOTE — Progress Notes (Signed)
  Echocardiogram Echocardiogram Transesophageal has been performed.  [name] 06/13/2020, 12:38 PM

## 2020-06-13 NOTE — Discharge Instructions (Signed)
Electrical Cardioversion Electrical cardioversion is the delivery of a jolt of electricity to restore a normal rhythm to the heart. A rhythm that is too fast or is not regular keeps the heart from pumping well. In this procedure, sticky patches or metal paddles are placed on the chest to deliver electricity to the heart from a device. This procedure may be done in an emergency if:  There is low or no blood pressure as a result of the heart rhythm.  Normal rhythm must be restored as fast as possible to protect the brain and heart from further damage.  It may save a life. This may also be a scheduled procedure for irregular or fast heart rhythms that are not immediately life-threatening. Tell a health care provider about:  Any allergies you have.  All medicines you are taking, including vitamins, herbs, eye drops, creams, and over-the-counter medicines.  Any problems you or family members have had with anesthetic medicines.  Any blood disorders you have.  Any surgeries you have had.  Any medical conditions you have.  Whether you are pregnant or may be pregnant. What are the risks? Generally, this is a safe procedure. However, problems may occur, including:  Allergic reactions to medicines.  A blood clot that breaks free and travels to other parts of your body.  The possible return of an abnormal heart rhythm within hours or days after the procedure.  Your heart stopping (cardiac arrest). This is rare. What happens before the procedure? Medicines  Your health care provider may have you start taking: ? Blood-thinning medicines (anticoagulants) so your blood does not clot as easily. ? Medicines to help stabilize your heart rate and rhythm.  Ask your health care provider about: ? Changing or stopping your regular medicines. This is especially important if you are taking diabetes medicines or blood thinners. ? Taking medicines such as aspirin and ibuprofen. These medicines can  thin your blood. Do not take these medicines unless your health care provider tells you to take them. ? Taking over-the-counter medicines, vitamins, herbs, and supplements. General instructions  Follow instructions from your health care provider about eating or drinking restrictions.  Plan to have someone take you home from the hospital or clinic.  If you will be going home right after the procedure, plan to have someone with you for 24 hours.  Ask your health care provider what steps will be taken to help prevent infection. These may include washing your skin with a germ-killing soap. What happens during the procedure?   An IV will be inserted into one of your veins.  Sticky patches (electrodes) or metal paddles may be placed on your chest.  You will be given a medicine to help you relax (sedative).  An electrical shock will be delivered. The procedure may vary among health care providers and hospitals. What can I expect after the procedure?  Your blood pressure, heart rate, breathing rate, and blood oxygen level will be monitored until you leave the hospital or clinic.  Your heart rhythm will be watched to make sure it does not change.  You may have some redness on the skin where the shocks were given. Follow these instructions at home:  Do not drive for 24 hours if you were given a sedative during your procedure.  Take over-the-counter and prescription medicines only as told by your health care provider.  Ask your health care provider how to check your pulse. Check it often.  Rest for 48 hours after the procedure or   as told by your health care provider.  Avoid or limit your caffeine use as told by your health care provider.  Keep all follow-up visits as told by your health care provider. This is important. Contact a health care provider if:  You feel like your heart is beating too quickly or your pulse is not regular.  You have a serious muscle cramp that does not go  away. Get help right away if:  You have discomfort in your chest.  You are dizzy or you feel faint.  You have trouble breathing or you are short of breath.  Your speech is slurred.  You have trouble moving an arm or leg on one side of your body.  Your fingers or toes turn cold or blue. Summary  Electrical cardioversion is the delivery of a jolt of electricity to restore a normal rhythm to the heart.  This procedure may be done right away in an emergency or may be a scheduled procedure if the condition is not an emergency.  Generally, this is a safe procedure.  After the procedure, check your pulse often as told by your health care provider. This information is not intended to replace advice given to you by your health care provider. Make sure you discuss any questions you have with your health care provider. Document Revised: 03/26/2019 Document Reviewed: 03/26/2019 Elsevier Patient Education  2020 Elsevier Inc. Transesophageal Echocardiogram Transesophageal echocardiogram (TEE) is a test that uses sound waves to take pictures of your heart. TEE is done by passing a flexible tube down the esophagus. The esophagus is the tube that carries food from the throat to the stomach. The pictures give detailed images of your heart. This can help your doctor see if there are problems with your heart. What happens before the procedure? Staying hydrated Follow instructions from your doctor about hydration, which may include:  Up to 3 hours before the procedure - you may continue to drink clear liquids, such as: ? Water. ? Clear fruit juice. ? Black coffee. ? Plain tea.  Eating and drinking Follow instructions from your doctor about eating and drinking, which may include:  8 hours before the procedure - stop eating heavy meals or foods such as meat, fried foods, or fatty foods.  6 hours before the procedure - stop eating light meals or foods, such as toast or cereal.  6 hours before  the procedure - stop drinking milk or drinks that contain milk.  3 hours before the procedure - stop drinking clear liquids. General instructions  You will need to take out any dentures or retainers.  Plan to have someone take you home from the hospital or clinic.  If you will be going home right after the procedure, plan to have someone with you for 24 hours.  Ask your doctor about: ? Changing or stopping your normal medicines. This is important if you take diabetes medicines or blood thinners. ? Taking over-the-counter medicines, vitamins, herbs, and supplements. ? Taking medicines such as aspirin and ibuprofen. These medicines can thin your blood. Do not take these medicines unless your doctor tells you to take them. What happens during the procedure?  To lower your risk of infection, your doctors will wash or clean their hands.  An IV will be put into one of your veins.  You will be given a medicine to help you relax (sedative).  A medicine may be sprayed or gargled. This numbs the back of your throat.  Your blood pressure, heart rate,   and breathing will be watched.  You may be asked to lay on your left side.  A bite block will be placed in your mouth. This keeps you from biting the tube.  The tip of the TEE probe will be placed into the back of your mouth.  You will be asked to swallow.  Your doctor will take pictures of your heart.  The probe and bite block will be taken out. The procedure may vary among doctors and hospitals. What happens after the procedure?   Your blood pressure, heart rate, breathing rate, and blood oxygen level will be watched until the medicines you were given have worn off.  When you first wake up, your throat may feel sore and numb. This will get better over time. You will not be allowed to eat or drink until the numbness has gone away.  Do not drive for 24 hours if you were given a medicine to help you relax. Summary  TEE is a test that  uses sound waves to take pictures of your heart.  You will be given a medicine to help you relax.  Do not drive for 24 hours if you were given a medicine to help you relax. This information is not intended to replace advice given to you by your health care provider. Make sure you discuss any questions you have with your health care provider. Document Revised: 05/12/2018 Document Reviewed: 11/24/2016 Elsevier Patient Education  2020 Elsevier Inc.  

## 2020-06-13 NOTE — Anesthesia Preprocedure Evaluation (Addendum)
Anesthesia Evaluation  Patient identified by MRN, date of birth, ID band Patient awake    Reviewed: Allergy & Precautions, H&P , NPO status , Patient's Chart, lab work & pertinent test results, reviewed documented beta blocker date and time   Airway Mallampati: II  TM Distance: >3 FB Neck ROM: Full    Dental no notable dental hx. (+) Upper Dentures, Dental Advisory Given   Pulmonary neg pulmonary ROS,    Pulmonary exam normal breath sounds clear to auscultation       Cardiovascular hypertension, Pt. on home beta blockers + dysrhythmias (coumadin) Atrial Fibrillation + pacemaker (tachy-brady syndrome s/p PPM 2011) + Valvular Problems/Murmurs (mild MR, mild-mod perivalvular AI s/p TAVR 10/30/19) AI and MR  Rhythm:Irregular Rate:Normal  Last echo 11/2019: 1. Left ventricular ejection fraction, by estimation, is 55 to 60%. The  left ventricle has normal function. The left ventricle has no regional  wall motion abnormalities. There is mild concentric left ventricular  hypertrophy. Left ventricular diastolic  parameters are indeterminate. Elevated left ventricular end-diastolic  pressure.  2. Right ventricular systolic function is normal. The right ventricular  size is normal. There is mildly elevated pulmonary artery systolic  pressure.  3. Left atrial size was severely dilated.  4. Right atrial size was severely dilated.  5. The mitral valve is normal in structure. Mild mitral valve  regurgitation. No evidence of mitral stenosis.  6. Mild to moderate perivalvular aortic regurgitation unchanged from  prior. The aortic valve has been repaired/replaced. Aortic valve  regurgitation is mild to moderate. No aortic stenosis is present. There is  a 23 mm Edwards Sapien prosthetic (TAVR)  valve present in the aortic position. Procedure Date: 10/30/19. Aortic  regurgitation PHT measures 358 msec. Aortic valve mean gradient measures  10.0  mmHg. Aortic valve Vmax measures 2.11 m/s.  7. Aortic dilatation noted. There is mild dilatation of the ascending  aorta.  8. The inferior vena cava is normal in size with greater than 50%  respiratory variability, suggesting right atrial pressure of 3 mmHg.   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 S/p whipple 05/2016   Endo/Other  Hypothyroidism   Renal/GU Renal InsufficiencyRenal diseaseCr 1.06  negative genitourinary   Musculoskeletal  (+) Arthritis , Osteoarthritis,    Abdominal   Peds  Hematology  (+) Blood dyscrasia, anemia , JEHOVAH'S WITNESS  Anesthesia Other Findings   Reproductive/Obstetrics negative OB ROS Hx uterine ca s/p hysterectomy, radiation 1980s                           Anesthesia Physical Anesthesia Plan  ASA: III  Anesthesia Plan: General   Post-op Pain Management:    Induction: Intravenous  PONV Risk Score and Plan: 3 and Propofol infusion and Treatment may vary due to age or medical condition  Airway Management Planned: Nasal Cannula  Additional Equipment: None  Intra-op Plan:   Post-operative Plan:   Informed Consent: I have reviewed the patients History and Physical, chart, labs and discussed the procedure including the risks, benefits and alternatives for the proposed anesthesia with the patient or authorized representative who has indicated his/her understanding and acceptance.     Dental advisory given  Plan Discussed with: CRNA  Anesthesia Plan Comments:        Anesthesia Quick Evaluation

## 2020-06-13 NOTE — Interval H&P Note (Signed)
History and Physical Interval Note:  06/13/2020 11:26 AM  [name]  has presented today for surgery, with the diagnosis of A-FIB.  The various methods of treatment have been discussed with the patient and family. After consideration of risks, benefits and other options for treatment, the patient has consented to  Procedure(s): TRANSESOPHAGEAL ECHOCARDIOGRAM (TEE) (N/A) CARDIOVERSION (N/A) as a surgical intervention.  The patient's history has been reviewed, patient examined, no change in status, stable for surgery.  I have reviewed the patient's chart and labs.  Questions were answered to the patient's satisfaction.     Edinson Domeier Harrell Gave

## 2020-06-13 NOTE — CV Procedure (Signed)
   TRANSESOPHAGEAL ECHOCARDIOGRAM GUIDED DIRECT CURRENT CARDIOVERSION  NAME:  [name]   MRN: [id number] DOB:  Feb 03, 1934   ADMIT DATE: 06/13/2020  INDICATIONS: Symptomatic atrial fibrillation  PROCEDURE:   Informed consent was obtained prior to the procedure. The risks, benefits and alternatives for the procedure were discussed and the patient comprehended these risks.  Risks include, but are not limited to, cough, sore throat, vomiting, nausea, somnolence, esophageal and stomach trauma or perforation, bleeding, low blood pressure, aspiration, pneumonia, infection, trauma to the teeth and death.    After a procedural time-out, the oropharynx was anesthetized and the patient was sedated by the anesthesia service. The transesophageal probe was inserted in the esophagus and stomach without difficulty and multiple views were obtained. Anesthesia was monitored by Dr. Verita Lamb. Patient received 125 mg IV propofol.  COMPLICATIONS:    Complications: No complications Patient tolerated procedure well.  FINDINGS:  LEFT VENTRICLE: EF = 55-60%. No regional wall motion abnormalities.  RIGHT VENTRICLE: Normal size and function. Pacer wire present.  LEFT ATRIUM: No thrombus/mass. There was low velocity and spontaneous echo contrast throughout the left atrium and appendage.  LEFT ATRIAL APPENDAGE: No thrombus/mass.   RIGHT ATRIUM: No thrombus/mass. Pacer wire present.  AORTIC VALVE:  S/P TAVR. Mild to moderate perivalvular regurgitation, similar to prior.  MITRAL VALVE:    Normal structure. Mild regurgitation. No vegetation.  TRICUSPID VALVE: Normal structure. Mild regurgitation. No vegetation.  PULMONIC VALVE: Grossly normal structure. Mild regurgitation. No apparent vegetation.  INTERATRIAL SEPTUM: No PFO or ASD seen by color Doppler.  PERICARDIUM: No effusion noted.  DESCENDING AORTA: Moderate diffuse plaque seen   CARDIOVERSION:     Indications:  Symptomatic Atrial  Fibrillation  Procedure Details:  Once the TEE was complete, the patient had the defibrillator pads placed in the anterior and posterior position. Once an appropriate level of sedation was confirmed, the patient was cardioverted x 2 with 120, 150J of biphasic synchronized energy.  The patient converted to NSR. This was confirmed by [employer] device interrogation. There were no apparent complications.  The patient had normal neuro status and respiratory status post procedure with vitals stable as recorded elsewhere.  Adequate airway was maintained throughout and vital signs monitored per protocol.  Buford Dresser, MD, PhD [facility]  [address], [place], [place] [address] [phone]   12:20 PM

## 2020-06-15 ENCOUNTER — Encounter (HOSPITAL_COMMUNITY): Payer: Self-pay | Admitting: Cardiology

## 2020-06-15 NOTE — Anesthesia Postprocedure Evaluation (Signed)
Anesthesia Post Note  Patient: [name]  Procedure(s) Performed: TRANSESOPHAGEAL ECHOCARDIOGRAM (TEE) (N/A ) CARDIOVERSION (N/A )     Patient location during evaluation: Endoscopy Anesthesia Type: General Level of consciousness: awake and alert Pain management: pain level controlled Vital Signs Assessment: post-procedure vital signs reviewed and stable Respiratory status: spontaneous breathing, nonlabored ventilation and respiratory function stable Cardiovascular status: blood pressure returned to baseline and stable Postop Assessment: no apparent nausea or vomiting Anesthetic complications: no   No complications documented.  Last Vitals:  Vitals:   06/13/20 1237 06/13/20 1247  BP: (!) 153/39 (!) 196/38  Pulse: (!) 57 66  Resp: 17 14  Temp:    SpO2: 96% 97%    Last Pain:  Vitals:   06/13/20 1247  TempSrc:   PainSc: 0-No pain                 Nichael Ehly,W. EDMOND

## 2020-06-16 ENCOUNTER — Encounter: Payer: Self-pay | Admitting: Cardiology

## 2020-06-16 ENCOUNTER — Ambulatory Visit: Payer: Medicare Other | Admitting: Cardiology

## 2020-06-16 ENCOUNTER — Other Ambulatory Visit: Payer: Self-pay

## 2020-06-16 ENCOUNTER — Other Ambulatory Visit: Payer: Self-pay | Admitting: Internal Medicine

## 2020-06-16 VITALS — BP 158/66 | HR 66 | Ht 59.0 in | Wt 99.2 lb

## 2020-06-16 DIAGNOSIS — I4891 Unspecified atrial fibrillation: Secondary | ICD-10-CM

## 2020-06-16 DIAGNOSIS — I5032 Chronic diastolic (congestive) heart failure: Secondary | ICD-10-CM

## 2020-06-16 DIAGNOSIS — Z950 Presence of cardiac pacemaker: Secondary | ICD-10-CM | POA: Diagnosis not present

## 2020-06-16 DIAGNOSIS — I10 Essential (primary) hypertension: Secondary | ICD-10-CM

## 2020-06-16 DIAGNOSIS — Z952 Presence of prosthetic heart valve: Secondary | ICD-10-CM | POA: Diagnosis not present

## 2020-06-16 DIAGNOSIS — I739 Peripheral vascular disease, unspecified: Secondary | ICD-10-CM

## 2020-06-16 MED ORDER — LOSARTAN POTASSIUM 25 MG PO TABS: 25.0000 mg | ORAL_TABLET | Freq: Every day | ORAL | 3 refills | Status: DC

## 2020-06-16 NOTE — Patient Instructions (Signed)
Start losartan 25 mg daily for blood pressure  We will check kidney function in a couple of weeks.

## 2020-06-20 ENCOUNTER — Ambulatory Visit (INDEPENDENT_AMBULATORY_CARE_PROVIDER_SITE_OTHER): Payer: Medicare Other | Admitting: Internal Medicine

## 2020-06-20 DIAGNOSIS — H5462 Unqualified visual loss, left eye, normal vision right eye: Secondary | ICD-10-CM | POA: Diagnosis not present

## 2020-06-20 DIAGNOSIS — I495 Sick sinus syndrome: Secondary | ICD-10-CM | POA: Diagnosis not present

## 2020-06-20 DIAGNOSIS — E559 Vitamin D deficiency, unspecified: Secondary | ICD-10-CM | POA: Diagnosis not present

## 2020-06-20 DIAGNOSIS — I130 Hypertensive heart and chronic kidney disease with heart failure and stage 1 through stage 4 chronic kidney disease, or unspecified chronic kidney disease: Secondary | ICD-10-CM | POA: Diagnosis not present

## 2020-06-20 DIAGNOSIS — G2581 Restless legs syndrome: Secondary | ICD-10-CM | POA: Diagnosis not present

## 2020-06-20 DIAGNOSIS — Z952 Presence of prosthetic heart valve: Secondary | ICD-10-CM | POA: Diagnosis not present

## 2020-06-20 DIAGNOSIS — Z950 Presence of cardiac pacemaker: Secondary | ICD-10-CM | POA: Diagnosis not present

## 2020-06-20 DIAGNOSIS — I272 Pulmonary hypertension, unspecified: Secondary | ICD-10-CM | POA: Diagnosis not present

## 2020-06-20 DIAGNOSIS — J301 Allergic rhinitis due to pollen: Secondary | ICD-10-CM | POA: Diagnosis not present

## 2020-06-20 DIAGNOSIS — E039 Hypothyroidism, unspecified: Secondary | ICD-10-CM | POA: Diagnosis not present

## 2020-06-20 DIAGNOSIS — Z85028 Personal history of other malignant neoplasm of stomach: Secondary | ICD-10-CM | POA: Diagnosis not present

## 2020-06-20 DIAGNOSIS — I081 Rheumatic disorders of both mitral and tricuspid valves: Secondary | ICD-10-CM | POA: Diagnosis not present

## 2020-06-20 DIAGNOSIS — I480 Paroxysmal atrial fibrillation: Secondary | ICD-10-CM | POA: Diagnosis not present

## 2020-06-20 DIAGNOSIS — N183 Chronic kidney disease, stage 3 unspecified: Secondary | ICD-10-CM | POA: Diagnosis not present

## 2020-06-20 DIAGNOSIS — I5033 Acute on chronic diastolic (congestive) heart failure: Secondary | ICD-10-CM | POA: Diagnosis not present

## 2020-06-20 DIAGNOSIS — Z5181 Encounter for therapeutic drug level monitoring: Secondary | ICD-10-CM | POA: Diagnosis not present

## 2020-06-20 DIAGNOSIS — E875 Hyperkalemia: Secondary | ICD-10-CM | POA: Diagnosis not present

## 2020-06-20 DIAGNOSIS — K219 Gastro-esophageal reflux disease without esophagitis: Secondary | ICD-10-CM | POA: Diagnosis not present

## 2020-06-20 DIAGNOSIS — H6523 Chronic serous otitis media, bilateral: Secondary | ICD-10-CM | POA: Diagnosis not present

## 2020-06-20 DIAGNOSIS — Z85038 Personal history of other malignant neoplasm of large intestine: Secondary | ICD-10-CM | POA: Diagnosis not present

## 2020-06-20 DIAGNOSIS — Z7901 Long term (current) use of anticoagulants: Secondary | ICD-10-CM | POA: Diagnosis not present

## 2020-06-20 DIAGNOSIS — G5791 Unspecified mononeuropathy of right lower limb: Secondary | ICD-10-CM | POA: Diagnosis not present

## 2020-06-20 DIAGNOSIS — I2510 Atherosclerotic heart disease of native coronary artery without angina pectoris: Secondary | ICD-10-CM | POA: Diagnosis not present

## 2020-06-20 DIAGNOSIS — K3184 Gastroparesis: Secondary | ICD-10-CM | POA: Diagnosis not present

## 2020-06-20 LAB — POCT INR: INR: 3.5 — AB (ref 2.0–3.0)

## 2020-06-20 NOTE — Patient Instructions (Signed)
Description   Spoke with Lawerance Bach, [facility] RN while in pt's home, advised for pt to hold warfarin today and then continue on same dosage 1 tablet daily. Recheck INR in 1 week- by Home Health.   Call us with any medication changes or concerns # [phone]. [place]'s # [phone].

## 2020-06-27 ENCOUNTER — Ambulatory Visit (INDEPENDENT_AMBULATORY_CARE_PROVIDER_SITE_OTHER): Payer: Medicare Other | Admitting: Pharmacist

## 2020-06-27 DIAGNOSIS — I4811 Longstanding persistent atrial fibrillation: Secondary | ICD-10-CM

## 2020-06-27 DIAGNOSIS — Z950 Presence of cardiac pacemaker: Secondary | ICD-10-CM | POA: Diagnosis not present

## 2020-06-27 DIAGNOSIS — I081 Rheumatic disorders of both mitral and tricuspid valves: Secondary | ICD-10-CM | POA: Diagnosis not present

## 2020-06-27 DIAGNOSIS — I480 Paroxysmal atrial fibrillation: Secondary | ICD-10-CM | POA: Diagnosis not present

## 2020-06-27 DIAGNOSIS — N183 Chronic kidney disease, stage 3 unspecified: Secondary | ICD-10-CM | POA: Diagnosis not present

## 2020-06-27 DIAGNOSIS — G2581 Restless legs syndrome: Secondary | ICD-10-CM | POA: Diagnosis not present

## 2020-06-27 DIAGNOSIS — Z85038 Personal history of other malignant neoplasm of large intestine: Secondary | ICD-10-CM | POA: Diagnosis not present

## 2020-06-27 DIAGNOSIS — Z85028 Personal history of other malignant neoplasm of stomach: Secondary | ICD-10-CM | POA: Diagnosis not present

## 2020-06-27 DIAGNOSIS — H6523 Chronic serous otitis media, bilateral: Secondary | ICD-10-CM | POA: Diagnosis not present

## 2020-06-27 DIAGNOSIS — E559 Vitamin D deficiency, unspecified: Secondary | ICD-10-CM | POA: Diagnosis not present

## 2020-06-27 DIAGNOSIS — K3184 Gastroparesis: Secondary | ICD-10-CM | POA: Diagnosis not present

## 2020-06-27 DIAGNOSIS — H5462 Unqualified visual loss, left eye, normal vision right eye: Secondary | ICD-10-CM | POA: Diagnosis not present

## 2020-06-27 DIAGNOSIS — E875 Hyperkalemia: Secondary | ICD-10-CM | POA: Diagnosis not present

## 2020-06-27 DIAGNOSIS — E039 Hypothyroidism, unspecified: Secondary | ICD-10-CM | POA: Diagnosis not present

## 2020-06-27 DIAGNOSIS — I272 Pulmonary hypertension, unspecified: Secondary | ICD-10-CM | POA: Diagnosis not present

## 2020-06-27 DIAGNOSIS — I2510 Atherosclerotic heart disease of native coronary artery without angina pectoris: Secondary | ICD-10-CM | POA: Diagnosis not present

## 2020-06-27 DIAGNOSIS — Z952 Presence of prosthetic heart valve: Secondary | ICD-10-CM | POA: Diagnosis not present

## 2020-06-27 DIAGNOSIS — Z7901 Long term (current) use of anticoagulants: Secondary | ICD-10-CM | POA: Diagnosis not present

## 2020-06-27 DIAGNOSIS — K219 Gastro-esophageal reflux disease without esophagitis: Secondary | ICD-10-CM | POA: Diagnosis not present

## 2020-06-27 DIAGNOSIS — J301 Allergic rhinitis due to pollen: Secondary | ICD-10-CM | POA: Diagnosis not present

## 2020-06-27 DIAGNOSIS — I495 Sick sinus syndrome: Secondary | ICD-10-CM | POA: Diagnosis not present

## 2020-06-27 DIAGNOSIS — I130 Hypertensive heart and chronic kidney disease with heart failure and stage 1 through stage 4 chronic kidney disease, or unspecified chronic kidney disease: Secondary | ICD-10-CM | POA: Diagnosis not present

## 2020-06-27 DIAGNOSIS — Z5181 Encounter for therapeutic drug level monitoring: Secondary | ICD-10-CM | POA: Diagnosis not present

## 2020-06-27 DIAGNOSIS — I5033 Acute on chronic diastolic (congestive) heart failure: Secondary | ICD-10-CM | POA: Diagnosis not present

## 2020-06-27 DIAGNOSIS — G5791 Unspecified mononeuropathy of right lower limb: Secondary | ICD-10-CM | POA: Diagnosis not present

## 2020-06-27 LAB — POCT INR: INR: 2.7 (ref 2.0–3.0)

## 2020-06-27 NOTE — Patient Instructions (Signed)
Description   Spoke with Junita Push, [facility] RN while in pt's home, advised to continue on same dosage 1 tablet daily. Recheck INR in 1 week- by Home Health.   Call us with any medication changes or concerns # [phone]. [place]'s # [phone].

## 2020-06-30 ENCOUNTER — Ambulatory Visit (INDEPENDENT_AMBULATORY_CARE_PROVIDER_SITE_OTHER): Payer: Medicare Other

## 2020-06-30 ENCOUNTER — Other Ambulatory Visit: Payer: Self-pay

## 2020-06-30 ENCOUNTER — Other Ambulatory Visit: Payer: Self-pay | Admitting: General Practice

## 2020-06-30 VITALS — Temp 97.2°F

## 2020-06-30 DIAGNOSIS — Z23 Encounter for immunization: Secondary | ICD-10-CM | POA: Diagnosis not present

## 2020-06-30 DIAGNOSIS — I10 Essential (primary) hypertension: Secondary | ICD-10-CM | POA: Diagnosis not present

## 2020-06-30 DIAGNOSIS — I480 Paroxysmal atrial fibrillation: Secondary | ICD-10-CM | POA: Diagnosis not present

## 2020-06-30 LAB — BASIC METABOLIC PANEL
BUN/Creatinine Ratio: 20 (ref 12–28)
BUN: 17 mg/dL (ref 8–27)
CO2: 21 mmol/L (ref 20–29)
Calcium: 8.9 mg/dL (ref 8.7–10.3)
Chloride: 112 mmol/L — ABNORMAL HIGH (ref 96–106)
Creatinine, Ser: 0.85 mg/dL (ref 0.57–1.00)
GFR calc Af Amer: 73 mL/min/{1.73_m2} (ref >59)
GFR calc non Af Amer: 63 mL/min/{1.73_m2} (ref >59)
Glucose: 57 mg/dL — ABNORMAL LOW (ref 65–99)
Potassium: 4.4 mmol/L (ref 3.5–5.2)
Sodium: 145 mmol/L — ABNORMAL HIGH (ref 134–144)

## 2020-07-04 ENCOUNTER — Ambulatory Visit (INDEPENDENT_AMBULATORY_CARE_PROVIDER_SITE_OTHER): Payer: Medicare Other | Admitting: Internal Medicine

## 2020-07-04 DIAGNOSIS — I130 Hypertensive heart and chronic kidney disease with heart failure and stage 1 through stage 4 chronic kidney disease, or unspecified chronic kidney disease: Secondary | ICD-10-CM | POA: Diagnosis not present

## 2020-07-04 DIAGNOSIS — Z7901 Long term (current) use of anticoagulants: Secondary | ICD-10-CM | POA: Diagnosis not present

## 2020-07-04 DIAGNOSIS — Z5181 Encounter for therapeutic drug level monitoring: Secondary | ICD-10-CM | POA: Diagnosis not present

## 2020-07-04 DIAGNOSIS — J301 Allergic rhinitis due to pollen: Secondary | ICD-10-CM | POA: Diagnosis not present

## 2020-07-04 DIAGNOSIS — I081 Rheumatic disorders of both mitral and tricuspid valves: Secondary | ICD-10-CM | POA: Diagnosis not present

## 2020-07-04 DIAGNOSIS — E875 Hyperkalemia: Secondary | ICD-10-CM | POA: Diagnosis not present

## 2020-07-04 DIAGNOSIS — K219 Gastro-esophageal reflux disease without esophagitis: Secondary | ICD-10-CM | POA: Diagnosis not present

## 2020-07-04 DIAGNOSIS — Z85028 Personal history of other malignant neoplasm of stomach: Secondary | ICD-10-CM | POA: Diagnosis not present

## 2020-07-04 DIAGNOSIS — G2581 Restless legs syndrome: Secondary | ICD-10-CM | POA: Diagnosis not present

## 2020-07-04 DIAGNOSIS — I272 Pulmonary hypertension, unspecified: Secondary | ICD-10-CM | POA: Diagnosis not present

## 2020-07-04 DIAGNOSIS — I480 Paroxysmal atrial fibrillation: Secondary | ICD-10-CM | POA: Diagnosis not present

## 2020-07-04 DIAGNOSIS — I5033 Acute on chronic diastolic (congestive) heart failure: Secondary | ICD-10-CM | POA: Diagnosis not present

## 2020-07-04 DIAGNOSIS — H6523 Chronic serous otitis media, bilateral: Secondary | ICD-10-CM | POA: Diagnosis not present

## 2020-07-04 DIAGNOSIS — N183 Chronic kidney disease, stage 3 unspecified: Secondary | ICD-10-CM | POA: Diagnosis not present

## 2020-07-04 DIAGNOSIS — G5791 Unspecified mononeuropathy of right lower limb: Secondary | ICD-10-CM | POA: Diagnosis not present

## 2020-07-04 DIAGNOSIS — E559 Vitamin D deficiency, unspecified: Secondary | ICD-10-CM | POA: Diagnosis not present

## 2020-07-04 DIAGNOSIS — Z950 Presence of cardiac pacemaker: Secondary | ICD-10-CM | POA: Diagnosis not present

## 2020-07-04 DIAGNOSIS — E039 Hypothyroidism, unspecified: Secondary | ICD-10-CM | POA: Diagnosis not present

## 2020-07-04 DIAGNOSIS — H5462 Unqualified visual loss, left eye, normal vision right eye: Secondary | ICD-10-CM | POA: Diagnosis not present

## 2020-07-04 DIAGNOSIS — I2510 Atherosclerotic heart disease of native coronary artery without angina pectoris: Secondary | ICD-10-CM | POA: Diagnosis not present

## 2020-07-04 DIAGNOSIS — Z952 Presence of prosthetic heart valve: Secondary | ICD-10-CM | POA: Diagnosis not present

## 2020-07-04 DIAGNOSIS — I495 Sick sinus syndrome: Secondary | ICD-10-CM | POA: Diagnosis not present

## 2020-07-04 DIAGNOSIS — K3184 Gastroparesis: Secondary | ICD-10-CM | POA: Diagnosis not present

## 2020-07-04 DIAGNOSIS — Z85038 Personal history of other malignant neoplasm of large intestine: Secondary | ICD-10-CM | POA: Diagnosis not present

## 2020-07-04 LAB — POCT INR: INR: 3.3 — AB (ref 2.0–3.0)

## 2020-07-04 NOTE — Patient Instructions (Signed)
Description   Spoke with Lawerance Bach, [facility] RN while in pt's home, advised for pt to Hold warfarin today and then to continue on same dosage 1 tablet daily. Recheck INR in 1 week- by Home Health.   Call us with any medication changes or concerns # [phone]. [place]'s # [phone].

## 2020-07-11 ENCOUNTER — Ambulatory Visit (INDEPENDENT_AMBULATORY_CARE_PROVIDER_SITE_OTHER): Payer: Medicare Other | Admitting: Internal Medicine

## 2020-07-11 DIAGNOSIS — Z5181 Encounter for therapeutic drug level monitoring: Secondary | ICD-10-CM | POA: Diagnosis not present

## 2020-07-11 DIAGNOSIS — I130 Hypertensive heart and chronic kidney disease with heart failure and stage 1 through stage 4 chronic kidney disease, or unspecified chronic kidney disease: Secondary | ICD-10-CM | POA: Diagnosis not present

## 2020-07-11 DIAGNOSIS — E039 Hypothyroidism, unspecified: Secondary | ICD-10-CM | POA: Diagnosis not present

## 2020-07-11 DIAGNOSIS — G2581 Restless legs syndrome: Secondary | ICD-10-CM | POA: Diagnosis not present

## 2020-07-11 DIAGNOSIS — K3184 Gastroparesis: Secondary | ICD-10-CM | POA: Diagnosis not present

## 2020-07-11 DIAGNOSIS — Z85038 Personal history of other malignant neoplasm of large intestine: Secondary | ICD-10-CM | POA: Diagnosis not present

## 2020-07-11 DIAGNOSIS — K219 Gastro-esophageal reflux disease without esophagitis: Secondary | ICD-10-CM | POA: Diagnosis not present

## 2020-07-11 DIAGNOSIS — I081 Rheumatic disorders of both mitral and tricuspid valves: Secondary | ICD-10-CM | POA: Diagnosis not present

## 2020-07-11 DIAGNOSIS — Z7901 Long term (current) use of anticoagulants: Secondary | ICD-10-CM | POA: Diagnosis not present

## 2020-07-11 DIAGNOSIS — I495 Sick sinus syndrome: Secondary | ICD-10-CM | POA: Diagnosis not present

## 2020-07-11 DIAGNOSIS — I2510 Atherosclerotic heart disease of native coronary artery without angina pectoris: Secondary | ICD-10-CM | POA: Diagnosis not present

## 2020-07-11 DIAGNOSIS — Z85028 Personal history of other malignant neoplasm of stomach: Secondary | ICD-10-CM | POA: Diagnosis not present

## 2020-07-11 DIAGNOSIS — J301 Allergic rhinitis due to pollen: Secondary | ICD-10-CM | POA: Diagnosis not present

## 2020-07-11 DIAGNOSIS — Z950 Presence of cardiac pacemaker: Secondary | ICD-10-CM | POA: Diagnosis not present

## 2020-07-11 DIAGNOSIS — I272 Pulmonary hypertension, unspecified: Secondary | ICD-10-CM | POA: Diagnosis not present

## 2020-07-11 DIAGNOSIS — H5462 Unqualified visual loss, left eye, normal vision right eye: Secondary | ICD-10-CM | POA: Diagnosis not present

## 2020-07-11 DIAGNOSIS — I480 Paroxysmal atrial fibrillation: Secondary | ICD-10-CM | POA: Diagnosis not present

## 2020-07-11 DIAGNOSIS — H6523 Chronic serous otitis media, bilateral: Secondary | ICD-10-CM | POA: Diagnosis not present

## 2020-07-11 DIAGNOSIS — E559 Vitamin D deficiency, unspecified: Secondary | ICD-10-CM | POA: Diagnosis not present

## 2020-07-11 DIAGNOSIS — I5033 Acute on chronic diastolic (congestive) heart failure: Secondary | ICD-10-CM | POA: Diagnosis not present

## 2020-07-11 DIAGNOSIS — E875 Hyperkalemia: Secondary | ICD-10-CM | POA: Diagnosis not present

## 2020-07-11 DIAGNOSIS — N183 Chronic kidney disease, stage 3 unspecified: Secondary | ICD-10-CM | POA: Diagnosis not present

## 2020-07-11 DIAGNOSIS — Z952 Presence of prosthetic heart valve: Secondary | ICD-10-CM | POA: Diagnosis not present

## 2020-07-11 DIAGNOSIS — G5791 Unspecified mononeuropathy of right lower limb: Secondary | ICD-10-CM | POA: Diagnosis not present

## 2020-07-11 LAB — POCT INR: INR: 3 (ref 2.0–3.0)

## 2020-07-11 NOTE — Patient Instructions (Signed)
Description   Spoke with Lawerance Bach, [facility] RN while in pt's home, advised for pt to Hold warfarin today and then to continue on same dosage 1 tablet daily. Recheck INR in 1 week- by Home Health.   Call us with any medication changes or concerns # [phone]. [place]'s # [phone].

## 2020-07-16 ENCOUNTER — Other Ambulatory Visit: Payer: Self-pay

## 2020-07-16 ENCOUNTER — Ambulatory Visit (HOSPITAL_COMMUNITY)
Admission: RE | Admit: 2020-07-16 | Discharge: 2020-07-16 | Disposition: A | Discharge status: Home / Self Care | Payer: Medicare Other | Source: Ambulatory Visit | Attending: Physician Assistant | Admitting: Physician Assistant

## 2020-07-16 ENCOUNTER — Telehealth: Payer: Self-pay | Admitting: Cardiology

## 2020-07-16 ENCOUNTER — Encounter (HOSPITAL_COMMUNITY): Payer: Self-pay | Admitting: Physician Assistant

## 2020-07-16 VITALS — BP 152/70 | HR 104 | Ht 59.0 in | Wt 93.4 lb

## 2020-07-16 DIAGNOSIS — D6869 Other thrombophilia: Secondary | ICD-10-CM | POA: Insufficient documentation

## 2020-07-16 DIAGNOSIS — Z7901 Long term (current) use of anticoagulants: Secondary | ICD-10-CM | POA: Insufficient documentation

## 2020-07-16 DIAGNOSIS — Z79890 Hormone replacement therapy (postmenopausal): Secondary | ICD-10-CM | POA: Insufficient documentation

## 2020-07-16 DIAGNOSIS — I5032 Chronic diastolic (congestive) heart failure: Secondary | ICD-10-CM | POA: Diagnosis not present

## 2020-07-16 DIAGNOSIS — E785 Hyperlipidemia, unspecified: Secondary | ICD-10-CM | POA: Insufficient documentation

## 2020-07-16 DIAGNOSIS — I4819 Other persistent atrial fibrillation: Secondary | ICD-10-CM | POA: Diagnosis not present

## 2020-07-16 DIAGNOSIS — Z950 Presence of cardiac pacemaker: Secondary | ICD-10-CM | POA: Diagnosis not present

## 2020-07-16 DIAGNOSIS — N183 Chronic kidney disease, stage 3 unspecified: Secondary | ICD-10-CM | POA: Diagnosis not present

## 2020-07-16 DIAGNOSIS — H5462 Unqualified visual loss, left eye, normal vision right eye: Secondary | ICD-10-CM | POA: Insufficient documentation

## 2020-07-16 DIAGNOSIS — Z85028 Personal history of other malignant neoplasm of stomach: Secondary | ICD-10-CM | POA: Diagnosis not present

## 2020-07-16 DIAGNOSIS — Z905 Acquired absence of kidney: Secondary | ICD-10-CM | POA: Diagnosis not present

## 2020-07-16 DIAGNOSIS — Z79899 Other long term (current) drug therapy: Secondary | ICD-10-CM | POA: Diagnosis not present

## 2020-07-16 DIAGNOSIS — I495 Sick sinus syndrome: Secondary | ICD-10-CM | POA: Diagnosis not present

## 2020-07-16 DIAGNOSIS — I130 Hypertensive heart and chronic kidney disease with heart failure and stage 1 through stage 4 chronic kidney disease, or unspecified chronic kidney disease: Secondary | ICD-10-CM | POA: Diagnosis not present

## 2020-07-16 DIAGNOSIS — Z87448 Personal history of other diseases of urinary system: Secondary | ICD-10-CM | POA: Diagnosis not present

## 2020-07-16 DIAGNOSIS — I69312 Visuospatial deficit and spatial neglect following cerebral infarction: Secondary | ICD-10-CM | POA: Diagnosis not present

## 2020-07-16 MED ORDER — METOPROLOL SUCCINATE ER 50 MG PO TB24: ORAL_TABLET | ORAL | 3 refills | Status: DC

## 2020-07-16 NOTE — Telephone Encounter (Signed)
Spoke with patient's daughter. Patient went into a-fib last night or they think she did. HR was 88 to 123bpm. This morning heart rate was 90-110, BP 136/90. Patient is weak and shaky. Patient was short of breath last night but not short of breath this morning. Pulse ox is putting O2 at 90-91% on room air. Patient cardioverted on October 8th.   Patient placed on schedule for a-fib clinic today with Clint Fenton. Spoke with Dawn in [place] clinic to confirm appointment.

## 2020-07-16 NOTE — Progress Notes (Signed)
Primary Care Physician: [name], MD Primary Cardiologist: Dr [name] Primary Electrophysiologist: Dr Rayann Heman Referring Physician: Dr Melton [name] is a 86 y.o. 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 [name] is on warfarin for a CHADS2VASC score of 7. She had TAVR 10/2019, post operative course complicated by AF and diastolic CHF. She underwent DCCV on 12/05/19. The device clinic received an alert for an ongoing afib episode starting 06/04/20 with rapid rates. She had noted symptoms of increased fatigue, dyspnea with exertion, and palpitations.  On follow up today, [name]. She was feeling well on follow up with Dr [name] 06/16/20. Unfortunately, [name] felt she was back in afib 07/15/20 with symptoms of heart racing, SOB, and fatigue. ECG today shows afib HR 104. She has had rates up to 120 at home. There were no specific triggers that she could identify.   Today, she denies symptoms of chest pain, orthopnea, PND, lower extremity edema, dizziness, presyncope, syncope, snoring, daytime somnolence, bleeding, or neurologic sequela. The [name] is tolerating medications without difficulties and is otherwise without complaint today.    Atrial Fibrillation Risk Factors:  she does not have symptoms or diagnosis of sleep apnea.   she has a BMI of Body mass index is 18.86 kg/m.[name] Filed Weights   07/16/20 1329  Weight: 42.4 kg    Family History  Problem Relation Age of Onset  . Colon cancer Mother 109  . Heart disease Mother   . Heart disease Father   . Heart attack Father   . Heart disease Maternal Grandmother   . Stomach cancer Brother   . Esophageal cancer Neg Hx    . Rectal cancer Neg Hx      Atrial Fibrillation Management history:  Previous antiarrhythmic drugs: amiodarone Previous cardioversions: 12/05/19, [name] Previous ablations: none CHADS2VASC score: 7 Anticoagulation history: warfarin    Past Medical History:  Diagnosis Date  . Adenocarcinoma of stomach ([place]) 11/11/2015   gastric mass on egd,   . Anal fissure   . Anemia    hx 3/17 iron infusion, on aranesp and injected B12 since 11/2015.   [name] Anxiety   . Aortic root dilatation ([place])   . Arthritis    oa  . Bladder cancer ([place])   . Blind left eye 2015   partial blind in left eye due to stroke   . 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 CAD (coronary artery disease)   . Cataracts, bilateral   . Chronic kidney disease    only has one kidney rt lft rem 03 ca     dr. Risa Grill, bladder cancer 05-04-2018  . Elevated LFTs    hepatic steatosis, none recent  . Endometrial cancer ([place])   . Gastroparesis   . GERD (gastroesophageal reflux disease)   . Hearing loss    wears hearing aids  . Heart murmur   . History of uterine cancer 1980s   treated with hysterectomy, external radiation and radiation seed implants.   [name] HTN (hypertension)   . Hyperlipemia   . Hypothyroidism   . Iron deficiency anemia due to chronic blood loss 11/24/2015  . Neuropathy    right leg  . Osteomyelitis ([place]) as child  . PAF (paroxysmal atrial  fibrillation) ([place])   . Presence of permanent cardiac pacemaker   . Refusal of blood transfusions as [name] is Jehovah's Witness   . Restless leg syndrome   . 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 Severe aortic stenosis   . Stroke [facility]) 2015   partial stroke left legally blind; left eye   . Tachycardia-bradycardia syndrome ([place])    s/p PPM by Dr Doreatha Lew (MDT) 07/03/10  . 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 Walker as ambulation aid   . Wears  glasses   . Wears partial dentures    upper   Past Surgical History:  Procedure Laterality Date  . CARDIAC CATHETERIZATION  2008  . CARDIOVERSION N/A 12/05/2019   Procedure: CARDIOVERSION;  Surgeon: Elouise Munroe, MD;  Location: [facility] ENDOSCOPY;  Service: Cardiovascular;  Laterality: N/A;  . CARDIOVERSION N/A 06/13/2020   Procedure: CARDIOVERSION;  Surgeon: Buford Dresser, MD;  Location: [facility] ENDOSCOPY;  Service: Cardiovascular;  Laterality: N/A;  . CHOLECYSTECTOMY  1970s  . COLONOSCOPY N/A 11/11/2015   Procedure: COLONOSCOPY;  Surgeon: [name], MD;  Location: [facility] ENDOSCOPY;  Service: Endoscopy;  Laterality: N/A;  . CYSTOSCOPY W/ RETROGRADES Right 05/01/2018   Procedure: CYSTOSCOPY WITH RIGHT RETROGRADE PYELOGRAM;  Surgeon: Lucas Mallow, MD;  Location: [facility] ORS;  Service: Urology;  Laterality: Right;  . CYSTOSCOPY W/ RETROGRADES Right 07/21/2018   Procedure: CYSTOSCOPY WITH RIGHT RETROGRADE PYELOGRAM;  Surgeon: Alexis Frock, MD;  Location: [facility] ORS;  Service: Urology;  Laterality: Right;  . ESOPHAGOGASTRODUODENOSCOPY N/A 11/11/2015   Procedure: ESOPHAGOGASTRODUODENOSCOPY (EGD);  Surgeon: [name], MD;  Location: [facility] ENDOS[facility];  Service: Endoscopy;  Laterality: N/A;  . ESOPHAGOGASTRODUODENOSCOPY N/A 02/05/2016   Procedure: ESOPHAGOGASTRODUODENOSCOPY (EGD);  Surgeon: Ladene Artist, MD;  Location[facility] ENDOSCOPY;  Service: Endoscopy;  Laterality: N/A;  . ESOPHAGOGASTRODUODENOSCOPY N/A 04/02/2016   Procedure: ESOPHAGOGASTRODUODENOSCOPY (EGD);  Surgeon: [name], MD;  Location: [facility] ENDOSCOPY;  Service: Endoscopy;  Laterality: N/A;  . ESOPHAGOGASTRODUODENOSCOPY (EGD) WITH PROPOFOL N/A 04/06/2017   Procedure: ESOPHAGOGASTRODUODENOSCOPY (EGD) WITH PROPOFOL;  Surgeon: [name], MD;  Location: [facility] ENDOSCOPY;  Service: Endoscopy;  Laterality: N/A;  . EYE SURGERY Bilateral    remove cataracts  . GASTRECTOMY N/A 01/15/2016   Procedure: OPEN PARTIAL GASTRECTOMY;  Surgeon: Stark Klein, MD;  Location: [place];  Service: General;  Laterality: N/A;  . GASTRECTOMY Left 02/24/2016   Procedure: OPEN J TUBE;  Surgeon: Stark Klein, MD;  Location: [place];  Service: General;  Laterality: Left;  [name] GASTROJEJUNOSTOMY N/A 05/18/2016   Procedure: ROUX EN [place];  Surgeon: Stark Klein, MD;  Location: [place];  Service: General;  Laterality: N/A;  . I & D KNEE WITH POLY EXCHANGE Right 12/17/2013   Procedure: IRRIGATION AND DEBRIDEMEN RIGHT TOTAL  KNEE WITH POLY EXCHANGE;  Surgeon: Mauri Pole, MD;  Location: [facility] ORS;  Service: Orthopedics;  Laterality: Right;  . IR GENERIC HISTORICAL  06/17/2016   IR RADIOLOGIST EVAL & MGMT 06/17/2016 Ascencion Dike, PA-C [facility] INTERV RAD  . LAPAROSCOPY N/A 01/15/2016   Procedure: LAPAROSCOPY DIAGNOSTIC;  Surgeon: Stark Klein, MD;  Location: [place];  Service: General;  Laterality: N/A;  . LYSIS OF ADHESION  05/18/2016   Procedure: LYSIS OF ADHESION;  Surgeon: Stark Klein, MD;  Location: [place];  Service: General;;  . NEPHRECTOMY Left    renal cell cancer  .  PACEMAKER INSERTION  07/04/2011   MDT PPM implanted by Dr Doreatha Lew for tachy/brady; managed by Dr Thompson Grayer   . PATELLAR TENDON REPAIR Right 03/06/2014   Procedure: AVULSION WITH PATELLA TENDON REPAIR;  Surgeon: Mauri Pole, MD;  Location: [facility]S;  Service: Orthopedics;  Laterality: Right;  . PATENT DUCTUS ARTERIOUS REPAIR  ~ 1949  . RIGHT/LEFT HEART CATH AND CORONARY ANGIOGRAPHY N/A 10/09/2019   Procedure: RIGHT/LEFT HEART CATH AND CORONARY ANGIOGRAPHY;  Surgeon: [name], [name], MD;  Location: [place] CV LAB;  Service: Cardiovascular;  Laterality: N/A;  . TEE WITHOUT CARDIOVERSION N/A 10/30/2019   Procedure: TRANSESOPHAGEAL ECHOCARDIOGRAM (TEE);  Surgeon: Burnell Blanks, MD;  Location: [place];  Service: Open Heart Surgery;  Laterality: N/A;  . TEE WITHOUT CARDIOVERSION N/A 06/13/2020   Procedure: TRANSESOPHAGEAL ECHOCARDIOGRAM (TEE);  Surgeon: Buford Dresser, MD;   Location: [facility] ENDOSCOPY;  Service: Cardiovascular;  Laterality: N/A;  . TOTAL ABDOMINAL HYSTERECTOMY  85  . TOTAL HIP REVISION Right 2009   initial replacment surg  . TOTAL KNEE ARTHROPLASTY  02/07/2012   Procedure: TOTAL KNEE ARTHROPLASTY;  Surgeon: Mauri Pole, MD;  Location: [facility]S;  Service: Orthopedics;  Laterality: Right;  . TOTAL KNEE REVISION Right 07/29/2014   Procedure: RIGHT KNEE REVISION OF PREVIOUS REPAIR EXTENSOR MECHANISM;  Surgeon: Mauri Pole, MD;  Location: [facility];  Service: Orthopedics;  Laterality: Right;  . TRANSCATHETER AORTIC VALVE REPLACEMENT, CAROTID Left 10/30/2019   TRANSCATHETER AORTIC VALVE REPLACEMENT, LEFT CAROTID (Left Neck)   . TRANSCATHETER AORTIC VALVE REPLACEMENT, CAROTID Left 10/30/2019   Procedure: TRANSCATHETER AORTIC VALVE REPLACEMENT, LEFT CAROTID;  Surgeon: Burnell Blanks, MD;  Location: [place];  Service: Open Heart Surgery;  Laterality: Left;  . TRANSURETHRAL RESECTION OF BLADDER TUMOR N/A 07/21/2018   Procedure: TRANSURETHRAL RESECTION OF BLADDER TUMOR (TURBT);  Surgeon: Alexis Frock, MD;  Location: [facility];  Service: Urology;  Laterality: N/A;  . TRANSURETHRAL RESECTION OF BLADDER TUMOR WITH MITOMYCIN-C N/A 05/01/2018   Procedure: TRANSURETHRAL RESECTION OF BLADDER TUMOR WITH POST OP INSTILLATION OF GEMCITABINE;  Surgeon: Lucas Mallow, MD;  Location[facility];  Service: Urology;  Laterality: N/A;  . WHIPPLE PROCEDURE N/A 05/18/2016   Procedure: EXPLORATORY LAPAROTOMY;  Surgeon: Stark Klein, MD;  Location: [place] OR;  Service: General;  Laterality: N/A;    Current Outpatient Medications  Medication Sig Dispense Refill  . acetaminophen (TYLENOL) 500 MG tablet Take 1,000 mg by mouth at bedtime.     . ALPRAZolam (XANAX) 0.5 MG tablet Take 0.5 mg by mouth 2 (two) times daily as needed for anxiety.     . Alum & Mag Hydroxide-Simeth (MYLANTA PO) Take 20 mLs by mouth every evening.     [name] amoxicillin (AMOXIL) 500 MG tablet Take 4 tablets (2,000  mg total) by mouth as directed. 1 hour prior to dental work including cleanings 12 tablet 12  . Ascorbic Acid (VITAMIN C) 1000 MG tablet Take 1,000 mg by mouth daily.    . Cholecalciferol (VITAMIN D-3) 5000 units TABS Take 5,000 Units by mouth 3 (three) times daily.     . Cyanocobalamin 5000 MCG SUBL Place 5,000 mcg under the tongue daily.     [name] erythromycin ophthalmic ointment Place 1 application into both eyes at bedtime as needed (stye).     . famotidine (PEPCID) 40 MG tablet TAKE 1 TABLET DAILY FOR INDIGESTION & HEARTBURN 90 tablet 3  . ferrous sulfate 325 (65 FE) MG tablet Take 325 mg by mouth daily in  the afternoon.    . furosemide (LASIX) 20 MG tablet Take 1 tablet (20 mg total) by mouth daily. 90 tablet 3  . gabapentin (NEURONTIN) 300 MG capsule TAKE 1 CAPSULE BY MOUTH THREE TIMES A DAY ([name] taking differently: Take 300 mg by mouth in the morning and at bedtime. ) 270 capsule 1  . hydrocortisone (ANUSOL-HC) 2.5 % rectal cream Place rectally 2 (two) times daily. 30 g 1  . levothyroxine (SYNTHROID) 100 MCG tablet TAKE 1/2 TABLET DAILY FOR THYROID. 45 tablet 1  . loperamide (IMODIUM A-D) 2 MG tablet Take up to 12 tablets /day as needed for  for Diarrhea ([name] taking differently: Take 2-4 mg by mouth 4 (four) times daily as needed for diarrhea or loose stools. ) 120 tablet 11  . loratadine (CLARITIN) 10 MG tablet Take 10 mg by mouth at bedtime.     [name] losartan (COZAAR) 25 MG tablet Take 1 tablet (25 mg total) by mouth daily. 90 tablet 3  . metoprolol succinate (TOPROL-XL) 50 MG 24 hr tablet Take 75mg  by mouth once a day Take with or immediately following a meal. 90 tablet 3  . nitroGLYCERIN (NITROSTAT) 0.4 MG SL tablet Dissolve 1 tablet under tongue every 5 minutes if needed for Angina ([name] taking differently: Place 0.4 mg under the tongue every 5 (five) minutes x 3 doses as needed for chest pain. ) 50 tablet prn  . ondansetron (ZOFRAN-ODT) 8 MG disintegrating tablet Dissolve 1 tablet  under tongue every 6 to 8 hours  3 x /day if needed for Nausea or Vomitting 30 tablet 3  . potassium chloride (KLOR-CON) 10 MEQ tablet Take 1 tablet (10 mEq total) by mouth daily. 90 tablet 3  . Propylene Glycol (SYSTANE COMPLETE) 0.6 % SOLN Place 1 drop into both eyes 3 (three) times daily as needed (irritation/dry eyes.).    [name] simethicone (MYLICON) 80 MG chewable tablet Chew 80 mg by mouth every 6 (six) hours as needed for flatulence.    . sucralfate (CARAFATE) 1 g tablet Take 1-2 g by mouth See admin instructions. Take 2 tablets (2 g) by mouth in the morning & take 1 tablet (1g) by mouth at lunch    . tobramycin-dexamethasone (TOBRADEX) ophthalmic solution Place 1 drop into both eyes daily as needed (stye).     [name] triamcinolone cream (KENALOG) 0.1 % Apply 1 application topically 2 (two) times daily as needed (rash/skin irritation.).     [name] verapamil (CALAN-SR) 120 MG CR tablet TAKE 1 TABLET 2 X /DAY WITH A MEAL FOR BP & HEART RHYTHM ([name] taking differently: Take 120 mg by mouth in the morning and at bedtime. Take 1 tablet 2 x /day with a meal for BP & Heart Rhythm) 180 tablet 3  . warfarin (COUMADIN) 2 MG tablet TAKE 1 TO 2 TABLETS AS DIRECTED TO PREVENT BLOOD CLOTS ([name] taking differently: Take 2 mg by mouth every evening. ) 180 tablet 1   No current facility-administered medications for this encounter.    Allergies  Allergen Reactions  . Amiodarone Other (See Comments)     Thyroid and liver and kidney problems  . Adhesive [Tape] Other (See Comments)    Tears skin, Please use "paper" tape  . Hydrocodone Other (See Comments)    HALLLUCINATIONS  . Other Other (See Comments)    NO BLOOD PRODUCTS -  [place] Witness  . Reglan [Metoclopramide] Hives, Itching and Rash  . Remeron [Mirtazapine] Other (See Comments)    Hallucinations and make pt loopy  .  Codeine Nausea And Vomiting  . Morphine And Related Nausea And Vomiting  . Requip [Ropinirole Hcl] Other (See Comments)    Headache    . Zinc Nausea Only    Social History   Socioeconomic History  . Marital status: Widowed    Spouse name: Not on file  . Number of children: 3  . Years of education: Not on file  . Highest education level: Not on file  Occupational History  . Not on file  Tobacco Use  . Smoking status: Never Smoker  . Smokeless tobacco: Never Used  Vaping Use  . Vaping Use: Never used  Substance and Sexual Activity  . Alcohol use: No  . Drug use: No  . Sexual activity: Never    Comment: Hysterectomy  Other Topics Concern  . Not on file  Social History Narrative   01/01/19 Lives in [place] w/dgtr, [name].  Continues to work.   Social Determinants of Health   Financial Resource Strain:   . Difficulty of Paying Living Expenses: Not on file  Food Insecurity:   . Worried About Charity fundraiser in the Last Year: Not on file  . Ran Out of Food in the Last Year: Not on file  Transportation Needs:   . Lack of Transportation (Medical): Not on file  . Lack of Transportation (Non-Medical): Not on file  Physical Activity:   . Days of Exercise per Week: Not on file  . Minutes of Exercise per Session: Not on file  Stress:   . Feeling of Stress : Not on file  Social Connections:   . Frequency of Communication with Friends and Family: Not on file  . Frequency of Social Gatherings with Friends and Family: Not on file  . Attends Religious Services: Not on file  . Active Member of Clubs or Organizations: Not on file  . Attends Archivist Meetings: Not on file  . Marital Status: Not on file  Intimate Partner Violence:   . Fear of Current or Ex-Partner: Not on file  . Emotionally Abused: Not on file  . Physically Abused: Not on file  . Sexually Abused: Not on file     ROS- All systems are reviewed and negative except as per the HPI above.  Physical Exam: Vitals:   07/16/20 1329  BP: (!) 152/70  Pulse: (!) 104  Weight: 42.4 kg  Height: 4\' 11"  (1.499 m)    GEN- The  [name] is well appearing elderly [name], alert and oriented x 3 today.   HEENT-head normocephalic, atraumatic, sclera clear, conjunctiva pink, hearing intact, trachea midline. Lungs- Clear to ausculation bilaterally, normal work of breathing Heart- irregular rate and rhythm, no murmurs, rubs or gallops  GI- soft, NT, ND, + BS Extremities- no clubbing, cyanosis, or edema MS- no significant deformity or atrophy Skin- no rash or lesion Psych- euthymic mood, full affect Neuro- strength and sensation are intact   Wt Readings from Last 3 Encounters:  07/16/20 42.4 kg  06/16/20 45 kg  06/13/20 44.2 kg    EKG today demonstrates afib HR 104, QRS 80, QTc 507  Echo 11/22/19 demonstrated  1. Left ventricular ejection fraction, by estimation, is 55 to 60%. The  left ventricle has normal function. The left ventricle has no regional  wall motion abnormalities. There is mild concentric left ventricular  hypertrophy. Left ventricular diastolic  parameters are indeterminate. Elevated left ventricular end-diastolic  pressure.  2. Right ventricular systolic function is normal. The right ventricular  size is  normal. There is mildly elevated pulmonary artery systolic  pressure.  3. Left atrial size was severely dilated.  4. Right atrial size was severely dilated.  5. The mitral valve is normal in structure. Mild mitral valve  regurgitation. No evidence of mitral stenosis.  6. Mild to moderate perivalvular aortic regurgitation unchanged from  prior. The aortic valve has been repaired/replaced. Aortic valve  regurgitation is mild to moderate. No aortic stenosis is present. There is  a 23 mm Edwards Sapien prosthetic (TAVR)  valve present in the aortic position. Procedure Date: 10/30/19. Aortic  regurgitation PHT measures 358 msec. Aortic valve mean gradient measures  10.0 mmHg. Aortic valve Vmax measures 2.11 m/s.  7. Aortic dilatation noted. There is mild dilatation of the ascending  aorta.   8. The inferior vena cava is normal in size with greater than 50%  respiratory variability, suggesting right atrial pressure of 3 mmHg.   Epic records are reviewed at length today  CHA2DS2-VASc Score = 7  The [name]'s score is based upon: CHF History: 0 HTN History: 1 Diabetes History: 0 Stroke History: 2 Vascular Disease History: 1      ASSESSMENT AND PLAN: 1. Persistent Atrial Fibrillation (ICD10:  I48.19) The [name]'s CHA2DS2-VASc score is 7, indicating a 11.2% annual risk of stroke.   [name] back in afib with rapid rates 100-120 bpm. Will increase Toprol to 75 mg daily for rate control.  AAD options limited with amiodarone intolerance ([name] confirmed she had to stop this medication in the past 2/2 abnormal thyroid and liver levels), renal impairment, long QT. ? Candidacy for AV node ablation if she proves difficult to rate control. Will request close follow up with Dr Rayann Heman.  Continue warfarin Continue verapamil 120 mg BID  2. Secondary Hypercoagulable State (ICD10:  D68.69) The [name] is at significant risk for stroke/thromboembolism based upon her CHA2DS2-VASc Score of 7.  Continue Warfarin (Coumadin).   3. Tachybradycardia syndrome S/p PPM, followed by Dr Rayann Heman and the device clinic.  4. HTN Stable, med changes as above.   Follow up with Dr Rayann Heman.   [facility] [address] [place], [place] [address] [phone] 07/16/2020 1:54 PM

## 2020-07-16 NOTE — Patient Instructions (Signed)
Increase metoprolol to 75mg  once a day (1 and 1/2 tablets once a day)

## 2020-07-16 NOTE — Telephone Encounter (Signed)
    [name] is calling, she said pt is in Afib and they send a transmission today. She said pt looks weak and o2 level is at 90-91

## 2020-07-18 ENCOUNTER — Ambulatory Visit (INDEPENDENT_AMBULATORY_CARE_PROVIDER_SITE_OTHER): Payer: Medicare Other | Admitting: *Deleted

## 2020-07-18 DIAGNOSIS — Z7901 Long term (current) use of anticoagulants: Secondary | ICD-10-CM

## 2020-07-18 DIAGNOSIS — I081 Rheumatic disorders of both mitral and tricuspid valves: Secondary | ICD-10-CM | POA: Diagnosis not present

## 2020-07-18 DIAGNOSIS — I4891 Unspecified atrial fibrillation: Secondary | ICD-10-CM | POA: Diagnosis not present

## 2020-07-18 DIAGNOSIS — I272 Pulmonary hypertension, unspecified: Secondary | ICD-10-CM | POA: Diagnosis not present

## 2020-07-18 DIAGNOSIS — Z952 Presence of prosthetic heart valve: Secondary | ICD-10-CM | POA: Diagnosis not present

## 2020-07-18 DIAGNOSIS — Z5181 Encounter for therapeutic drug level monitoring: Secondary | ICD-10-CM | POA: Diagnosis not present

## 2020-07-18 DIAGNOSIS — H6523 Chronic serous otitis media, bilateral: Secondary | ICD-10-CM | POA: Diagnosis not present

## 2020-07-18 DIAGNOSIS — Z85028 Personal history of other malignant neoplasm of stomach: Secondary | ICD-10-CM | POA: Diagnosis not present

## 2020-07-18 DIAGNOSIS — Z950 Presence of cardiac pacemaker: Secondary | ICD-10-CM | POA: Diagnosis not present

## 2020-07-18 DIAGNOSIS — I480 Paroxysmal atrial fibrillation: Secondary | ICD-10-CM | POA: Diagnosis not present

## 2020-07-18 DIAGNOSIS — Z85038 Personal history of other malignant neoplasm of large intestine: Secondary | ICD-10-CM | POA: Diagnosis not present

## 2020-07-18 DIAGNOSIS — E875 Hyperkalemia: Secondary | ICD-10-CM | POA: Diagnosis not present

## 2020-07-18 DIAGNOSIS — K219 Gastro-esophageal reflux disease without esophagitis: Secondary | ICD-10-CM | POA: Diagnosis not present

## 2020-07-18 DIAGNOSIS — I2510 Atherosclerotic heart disease of native coronary artery without angina pectoris: Secondary | ICD-10-CM | POA: Diagnosis not present

## 2020-07-18 DIAGNOSIS — H5462 Unqualified visual loss, left eye, normal vision right eye: Secondary | ICD-10-CM | POA: Diagnosis not present

## 2020-07-18 DIAGNOSIS — G5791 Unspecified mononeuropathy of right lower limb: Secondary | ICD-10-CM | POA: Diagnosis not present

## 2020-07-18 DIAGNOSIS — I5033 Acute on chronic diastolic (congestive) heart failure: Secondary | ICD-10-CM | POA: Diagnosis not present

## 2020-07-18 DIAGNOSIS — N183 Chronic kidney disease, stage 3 unspecified: Secondary | ICD-10-CM | POA: Diagnosis not present

## 2020-07-18 DIAGNOSIS — G2581 Restless legs syndrome: Secondary | ICD-10-CM | POA: Diagnosis not present

## 2020-07-18 DIAGNOSIS — I495 Sick sinus syndrome: Secondary | ICD-10-CM | POA: Diagnosis not present

## 2020-07-18 DIAGNOSIS — E559 Vitamin D deficiency, unspecified: Secondary | ICD-10-CM | POA: Diagnosis not present

## 2020-07-18 DIAGNOSIS — E039 Hypothyroidism, unspecified: Secondary | ICD-10-CM | POA: Diagnosis not present

## 2020-07-18 DIAGNOSIS — I130 Hypertensive heart and chronic kidney disease with heart failure and stage 1 through stage 4 chronic kidney disease, or unspecified chronic kidney disease: Secondary | ICD-10-CM | POA: Diagnosis not present

## 2020-07-18 DIAGNOSIS — K3184 Gastroparesis: Secondary | ICD-10-CM | POA: Diagnosis not present

## 2020-07-18 DIAGNOSIS — J301 Allergic rhinitis due to pollen: Secondary | ICD-10-CM | POA: Diagnosis not present

## 2020-07-18 LAB — POCT INR: INR: 2.8 (ref 2.0–3.0)

## 2020-07-18 NOTE — Patient Instructions (Signed)
Description   Spoke with Fletcher Anon from [facility] RN advised for pt to continue taking 1 tablet daily except for 1/2  tablet on Sundays.  Recheck INR in 2 weeks by Home Health.   Call us with any medication changes or concerns # [phone]. [place]'s # [phone].

## 2020-07-23 ENCOUNTER — Other Ambulatory Visit: Payer: Self-pay | Admitting: Physician Assistant

## 2020-07-23 DIAGNOSIS — Z952 Presence of prosthetic heart valve: Secondary | ICD-10-CM

## 2020-07-30 ENCOUNTER — Ambulatory Visit (INDEPENDENT_AMBULATORY_CARE_PROVIDER_SITE_OTHER): Payer: Medicare Other | Admitting: Pharmacist

## 2020-07-30 DIAGNOSIS — I2510 Atherosclerotic heart disease of native coronary artery without angina pectoris: Secondary | ICD-10-CM | POA: Diagnosis not present

## 2020-07-30 DIAGNOSIS — E875 Hyperkalemia: Secondary | ICD-10-CM | POA: Diagnosis not present

## 2020-07-30 DIAGNOSIS — I272 Pulmonary hypertension, unspecified: Secondary | ICD-10-CM | POA: Diagnosis not present

## 2020-07-30 DIAGNOSIS — Z7901 Long term (current) use of anticoagulants: Secondary | ICD-10-CM | POA: Diagnosis not present

## 2020-07-30 DIAGNOSIS — J301 Allergic rhinitis due to pollen: Secondary | ICD-10-CM | POA: Diagnosis not present

## 2020-07-30 DIAGNOSIS — Z5181 Encounter for therapeutic drug level monitoring: Secondary | ICD-10-CM | POA: Diagnosis not present

## 2020-07-30 DIAGNOSIS — N183 Chronic kidney disease, stage 3 unspecified: Secondary | ICD-10-CM | POA: Diagnosis not present

## 2020-07-30 DIAGNOSIS — H5462 Unqualified visual loss, left eye, normal vision right eye: Secondary | ICD-10-CM | POA: Diagnosis not present

## 2020-07-30 DIAGNOSIS — G2581 Restless legs syndrome: Secondary | ICD-10-CM | POA: Diagnosis not present

## 2020-07-30 DIAGNOSIS — I081 Rheumatic disorders of both mitral and tricuspid valves: Secondary | ICD-10-CM | POA: Diagnosis not present

## 2020-07-30 DIAGNOSIS — Z85038 Personal history of other malignant neoplasm of large intestine: Secondary | ICD-10-CM | POA: Diagnosis not present

## 2020-07-30 DIAGNOSIS — I4891 Unspecified atrial fibrillation: Secondary | ICD-10-CM | POA: Diagnosis not present

## 2020-07-30 DIAGNOSIS — E039 Hypothyroidism, unspecified: Secondary | ICD-10-CM | POA: Diagnosis not present

## 2020-07-30 DIAGNOSIS — I495 Sick sinus syndrome: Secondary | ICD-10-CM | POA: Diagnosis not present

## 2020-07-30 DIAGNOSIS — K3184 Gastroparesis: Secondary | ICD-10-CM | POA: Diagnosis not present

## 2020-07-30 DIAGNOSIS — I130 Hypertensive heart and chronic kidney disease with heart failure and stage 1 through stage 4 chronic kidney disease, or unspecified chronic kidney disease: Secondary | ICD-10-CM | POA: Diagnosis not present

## 2020-07-30 DIAGNOSIS — G5791 Unspecified mononeuropathy of right lower limb: Secondary | ICD-10-CM | POA: Diagnosis not present

## 2020-07-30 DIAGNOSIS — Z952 Presence of prosthetic heart valve: Secondary | ICD-10-CM | POA: Diagnosis not present

## 2020-07-30 DIAGNOSIS — H6523 Chronic serous otitis media, bilateral: Secondary | ICD-10-CM | POA: Diagnosis not present

## 2020-07-30 DIAGNOSIS — K219 Gastro-esophageal reflux disease without esophagitis: Secondary | ICD-10-CM | POA: Diagnosis not present

## 2020-07-30 DIAGNOSIS — E559 Vitamin D deficiency, unspecified: Secondary | ICD-10-CM | POA: Diagnosis not present

## 2020-07-30 DIAGNOSIS — I480 Paroxysmal atrial fibrillation: Secondary | ICD-10-CM | POA: Diagnosis not present

## 2020-07-30 DIAGNOSIS — I5033 Acute on chronic diastolic (congestive) heart failure: Secondary | ICD-10-CM | POA: Diagnosis not present

## 2020-07-30 DIAGNOSIS — Z85028 Personal history of other malignant neoplasm of stomach: Secondary | ICD-10-CM | POA: Diagnosis not present

## 2020-07-30 DIAGNOSIS — Z950 Presence of cardiac pacemaker: Secondary | ICD-10-CM | POA: Diagnosis not present

## 2020-07-30 LAB — POCT INR: INR: 2.5 (ref 2.0–3.0)

## 2020-08-04 ENCOUNTER — Other Ambulatory Visit: Payer: Self-pay | Admitting: Internal Medicine

## 2020-08-08 DIAGNOSIS — I700 Atherosclerosis of aorta: Secondary | ICD-10-CM | POA: Insufficient documentation

## 2020-08-08 DIAGNOSIS — I739 Peripheral vascular disease, unspecified: Secondary | ICD-10-CM | POA: Insufficient documentation

## 2020-08-08 DIAGNOSIS — I712 Thoracic aortic aneurysm, without rupture, unspecified: Secondary | ICD-10-CM | POA: Insufficient documentation

## 2020-08-08 NOTE — Progress Notes (Signed)
MEDICARE ANNUAL WELLNESS VISIT AND 3 month follow up  Assessment:    Encounter for Medicare annual wellness exam 1 year  Atrial fibrillation, unspecified type (HCC)/ Secondary hypercoaguable state Continue coumadin, followed by a. Fib clinic  Tachy/brady syndrome [facility]) S/p pacemaker, on BB, verapamil Cardiology following  Diastolic CHF ([place]) Appears euvolemic; cardiology following  Emphasized salt restriction, less than 2000mg  a day. Encouraged daily monitoring of the patient's weight, call office if 3 lb weight loss or gain in a day.  Encouraged regular exercise. If any increasing shortness of breath, swelling, or chest pressure go to ER immediately.  decrease your fluid intake to less than 2 L daily please remember to always increase your potassium intake with any increase of your fluid pill.   Thoracic aneurysm without rupture (HCC) Descending, 4.0 cm per CTA 10/2019, Control BP, cardiology following  PAD ([place]) With claudication R>L, per CTA 10/2019, cardiology Discussed and initiated statin  Atherosclerosis of aorta ([place]) Control blood pressure, cholesterol, glucose, increase exercise.   Adenocarcinoma of stomach ([place]) Continue follow up with GI Takes imodium occ due to diarrhea from radiation  Cancer of ascending colon [facility]) S/p resection, follow up colonoscopy planned 11/2020 by Dr. Carlean Purl Discussing genetic testing  Malignant neoplasm of kidney excluding renal pelvis, unspecified laterality ([place]) Continue to monitor  CKD (chronic kidney disease), stage III ([place]) Has 1 kidney s/p resection for cancer;  Increase fluids, avoid NSAIDS, monitor sugars, will monitor -     COMPLETE METABOLIC PANEL WITH GFR  Malignant neoplasm of urinary bladder, unspecified site [facility]) Just got a treatment 3 weeks ago  Hyperlipidemia, unspecified hyperlipidemia type -     Lipid panel decrease fatty foods increase activity.  Discussed and initiated low dose statin - pravastatin  20 mg daily after discussion of risks and benefits  Vitamin B12 deficiency Monitor; continue supplement  Vitamin D deficiency Continue supplement  Long term current use of anticoagulant therapy Continue follow up cardio; present to ED for falls with head injury or excess bleeding  History of stroke Control blood pressure, cholesterol, glucose, increase exercise.   S/P partial gastrectomy Monitor  Iron deficiency anemia due to chronic blood loss Check CBC, monitor iron, continue supplement as indicated  Refusal of blood transfusions as patient is Jehovah's Witness FYI  Other abnormal glucose Discussed disease progression and risks Discussed diet/exercise, weight management and risk modification  Cardiac pacemaker  Follow up cardio  S/P repair of patent ductus arteriosus Follow up cardio  Post gastrectomy syndrome GI following  Hypothyroidism, unspecified type continue medications the same pending lab results reminded to take on an empty stomach 30-63mins before food.  -     TSH  Essential hypertension Continue medications, try taking losartan at night Monitor blood pressure at home; call if consistently over 150/80 Continue DASH diet.   Reminder to go to the ER if any CP, SOB, nausea, dizziness, severe HA, changes vision/speech, left arm numbness and tingling and jaw pain. -     CBC with Differential/Platelet -     COMPLETE METABOLIC PANEL WITH GFR  Aortic stenosis, moderate, S/P TAVR Follow up cardio  Medication management -     Magnesium  Rash - check CBC, C3/C4, anti smith due to unexplained rash with some petichiae to extremities - denies new meds, supplements, detergents or hygiene products - try prednisone taper to evaluate response - low threshold for referral to derm for further evaluation, follow up if not resolving    Over 40 minutes of exam, counseling,  chart review and critical decision making was performed Future Appointments  Date Time  Provider [place]  09/08/2020  7:15 AM [facility] DEVICE REMOTES [facility]  10/29/2020 11:35 AM [facility]-CV CH ECHO 5 [facility]SITE3ECHO [facility]  11/17/2020 10:40 AM [name], [name], MD [facility]  12/08/2020  7:15 AM [facility] DEVICE REMOTES [facility]  02/12/2021  2:00 PM [name], MD [facility] None  06/08/2021  7:15 AM [facility] DEVICE REMOTES [facility]  08/19/2021 10:30 AM [name], NP [facility] None     Plan:   During the course of the visit the patient was educated and counseled about appropriate screening and preventive services including:    Pneumococcal vaccine   Prevnar 13  Influenza vaccine  Td vaccine  Screening electrocardiogram  Bone densitometry screening  Colorectal cancer screening  Diabetes screening  Glaucoma screening  Nutrition counseling   Advanced directives: requested   Subjective:  [name] is a 86 y.o. female who presents for Medicare Annual Wellness Visit and follow up.   Today she is accompanied by her daughter; has new persistent rash, 6 weeks, to upper and lower extremities, chest and back, no discharge, non-pruritic, not changing. Denies new medications or supplements, change in detergents or hygiene products in the last 6 months Has tried topical abx without benefit.   She has idiopathic peripheral neuropathy, also with restless legs taking gabapentin which helps significantly.   Takes xanax 0.25-0.5 mg very rarely for anxiety, helpful but causes sedation, requests lower dose.   In June 2021 she was referred back to Dr. Carlean Purl due to rectal bleeding, underwent colonoscopy 04/11/2020 which showed multiple colon polyps, a 25 mm ascending colon and had a focus of at least intramucosal carcinoma, without lymphovascular invasion. Polyp was resected, she is a poor surgical candidate. Underwent repeat colonoscopy on 05/23/2020 by Dr. Fuller Plan who resected an additional 8  polyps which pathology showed were benign. Planning 6 month follow up colonoscopy.   Patient is notable for hx/o 4 prior Cancers - Uterine Ca (1994),Transitional CellKidney Ca (2003), Gastric Ca (2017)and Bladder Ca (2019). Genetic testing and counseling has been discussed by Dr. Carlean Purl, planning to discuss at follow up.   Fortunately hgb remains stable in 12 range -  Hx of iron def anemia on iron ferrous sulface 325 mg daily  CBC Latest Ref Rng & Units 06/10/2020 03/04/2020 02/12/2020  WBC 4.0 - 10.5 K/uL 5.6 6.6 6.1  Hemoglobin 12.0 - 15.0 g/dL 12.9 12.5 12.5  Hematocrit 36.0 - 46.0 % 40.8 37.4 37.8  Platelets 150 - 400 K/uL 160 175 158    She also has complicated cardiac history of persistent atrial fibrillation , tachy-brady syndrome s/p PPM (rate control by metoprolol 75 mg BID, verapamil 120 mg BID). Patient is on warfarin for a CHADS2VASC score of 7. She had severe AS s/p TAVR 10/2019 (comadin followed by a. Fib clinic) post operative course complicated by AF and diastolic CHF (lasix 20 mg daily),She underwent DCCV on 12/05/19 with recurrence with rapid rates in Sept 2021 and s/p DCCV 06/13/2020.    Her blood pressure has not been controlled at home (labile assocaited with a. Fib, 120/60s- 160s/70s checks 4 times daily, typically elevated in AM, low in PM), today their BP is BP: (!) 160/60 Losartan was increased to 50 mg at last visit by Dr. Rayann Heman, has recommended BP goal of <150/80 which she is typically at in the afternoons and evening.   She does workout. She denies chest pain, shortness of breath, dizziness,  edema, PAD.   Denies dyspnea on exertion, orthopnea, paroxysmal nocturnal dyspnea and edema. Positive for intermittent palpitations. Wt Readings from Last 3 Encounters:  08/18/20 96 lb (43.5 kg)  08/11/20 97 lb 6.4 oz (44.2 kg)  07/16/20 93 lb 6.4 oz (42.4 kg)   She has CKD II/III with solitary kidney s/p resction for cancer; last GFR:  Lab Results  Component Value Date    GFRNONAA 63 06/30/2020   GFRNONAA 48 (L) 06/10/2020   GFRNONAA 50 (L) 02/12/2020   She has hx of left central retinal artery occlusion from cholesterol plaque, mild carotid stenosis per dopplers 08/15/2019, 4.0 cm ectatic aorta, severe aortic and ileofemoral atherosclerosis with multifocal stenosis in the R external iliac, with chronic claudication sx of R > L without critical ischemia, followed by cardiology Dr. [name]. Has not been initiated on antiplatelet therapy due to high bleeding risk in context of GI cancer, anemia, and Jehovah's witness declining blood products.    She is not on cholesterol medication. Denies hx of intolerance; discussed possible benefit from statin, willing to try low dose for possible benefit with her vascular hx. Her cholesterol is at goal. The cholesterol last visit was:   Lab Results  Component Value Date   CHOL 135 02/12/2020   HDL 41 (L) 02/12/2020   LDLCALC 74 02/12/2020   TRIG 114 02/12/2020   CHOLHDL 3.3 02/12/2020   Last A1C in the office was:  Lab Results  Component Value Date   HGBA1C 4.6 02/12/2020   Patient is on Vitamin D supplement.   Lab Results  Component Value Date   VD25OH 89 02/12/2020     She is on thyroid medication. Her medication was not changed last visit.   Lab Results  Component Value Date   TSH 1.50 02/12/2020  .    Medication Review: Current Outpatient Medications on File Prior to Visit  Medication Sig Dispense Refill  . acetaminophen (TYLENOL) 500 MG tablet Take 1,000 mg by mouth at bedtime.    . ALPRAZolam (XANAX) 0.5 MG tablet Take 0.5 mg by mouth 2 (two) times daily as needed for anxiety.     . Alum & Mag Hydroxide-Simeth (MYLANTA PO) Take 20 mLs by mouth every evening.     [name] amoxicillin (AMOXIL) 500 MG tablet Take 4 tablets (2,000 mg total) by mouth as directed. 1 hour prior to dental work including cleanings 12 tablet 12  . Ascorbic Acid (VITAMIN C) 1000 MG tablet Take 1,000 mg by mouth daily.    . Cholecalciferol  (VITAMIN D-3) 5000 units TABS Take 5,000 Units by mouth 3 (three) times daily.     . Cyanocobalamin 5000 MCG SUBL Place 5,000 mcg under the tongue daily.     [name] erythromycin ophthalmic ointment Place 1 application into both eyes at bedtime as needed (stye).     . famotidine (PEPCID) 40 MG tablet TAKE 1 TABLET DAILY FOR INDIGESTION & HEARTBURN 90 tablet 3  . ferrous sulfate 325 (65 FE) MG tablet Take 325 mg by mouth daily in the afternoon.    . furosemide (LASIX) 20 MG tablet Take 1 tablet (20 mg total) by mouth daily. 90 tablet 3  . gabapentin (NEURONTIN) 300 MG capsule TAKE 1 CAPSULE BY MOUTH THREE TIMES A DAY 270 capsule 1  . hydrocortisone (ANUSOL-HC) 2.5 % rectal cream Place rectally 2 (two) times daily. 30 g 1  . levothyroxine (SYNTHROID) 100 MCG tablet TAKE 1/2 TABLET DAILY FOR THYROID. 45 tablet 1  . loperamide (IMODIUM A-D) 2  MG tablet Take up to 12 tablets /day as needed for  for Diarrhea 120 tablet 11  . loratadine (CLARITIN) 10 MG tablet Take 10 mg by mouth at bedtime.    [name] losartan (COZAAR) 50 MG tablet Take 1 tablet (50 mg total) by mouth daily. 90 tablet 3  . metoprolol succinate (TOPROL-XL) 50 MG 24 hr tablet Take 75mg  by mouth once a day Take with or immediately following a meal. 90 tablet 3  . nitroGLYCERIN (NITROSTAT) 0.4 MG SL tablet Dissolve 1 tablet under tongue every 5 minutes if needed for Angina 50 tablet prn  . ondansetron (ZOFRAN-ODT) 8 MG disintegrating tablet Dissolve 1 tablet under tongue every 6 to 8 hours  3 x /day if needed for Nausea or Vomitting 30 tablet 3  . potassium chloride (KLOR-CON) 10 MEQ tablet Take 1 tablet (10 mEq total) by mouth daily. 90 tablet 3  . Propylene Glycol (SYSTANE COMPLETE) 0.6 % SOLN Place 1 drop into both eyes 3 (three) times daily as needed (irritation/dry eyes.).    [name] simethicone (MYLICON) 80 MG chewable tablet Chew 80 mg by mouth every 6 (six) hours as needed for flatulence.    . sucralfate (CARAFATE) 1 g tablet Take 1-2 g by mouth See  admin instructions. Take 2 tablets (2 g) by mouth in the morning & take 1 tablet (1g) by mouth at lunch    . tobramycin-dexamethasone (TOBRADEX) ophthalmic solution Place 1 drop into both eyes daily as needed (stye).     [name] triamcinolone cream (KENALOG) 0.1 % Apply 1 application topically 2 (two) times daily as needed (rash/skin irritation.).     [name] verapamil (CALAN-SR) 120 MG CR tablet TAKE 1 TABLET 2 X /DAY WITH A MEAL FOR BP & HEART RHYTHM 180 tablet 3  . warfarin (COUMADIN) 2 MG tablet Take      as directed       to Prevent Blood Clots (Patient taking differently: Take one tablet daily and 1/2 tablet on Sunday) 180 tablet 0   No current facility-administered medications on file prior to visit.    Allergies  Allergen Reactions  . Amiodarone Other (See Comments)     Thyroid and liver and kidney problems  . Adhesive [Tape] Other (See Comments)    Tears skin, Please use "paper" tape  . Hydrocodone Other (See Comments)    HALLLUCINATIONS  . Other Other (See Comments)    NO BLOOD PRODUCTS -  Jehoval Witness  . Reglan [Metoclopramide] Hives, Itching and Rash  . Remeron [Mirtazapine] Other (See Comments)    Hallucinations and make pt loopy  . Codeine Nausea And Vomiting  . Morphine And Related Nausea And Vomiting  . Requip [Ropinirole Hcl] Other (See Comments)    Headache   . Zinc Nausea Only    Current Problems (verified) Patient Active Problem List   Diagnosis Date Noted  . Thoracic aortic aneurysm without rupture ([place]) 08/08/2020  . PAD (peripheral artery disease) ([place]) 08/08/2020  . Aortic atherosclerosis ([place]) 08/08/2020  . Secondary hypercoagulable state ([place]) 06/10/2020  . Cancer of ascending colon ([place]) 05/08/2020  . 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 Chronic diastolic (congestive) heart failure ([place]) 01/05/2019  . Malignant tumor of urinary bladder ([place]) 05/11/2018  . History of stroke 04/11/2018  . Long term current use of anticoagulant  therapy 03/19/2016  . S/P partial gastrectomy 02/17/2016  . Post gastrectomy syndrome   . Gastroesophageal reflux disease with esophagitis   . Iron deficiency anemia due to chronic  blood loss 11/24/2015  . Refusal of blood transfusions as patient is Jehovah's Witness   . Adenocarcinoma of stomach ([place]) 11/11/2015  . CKD (chronic kidney disease), stage III ([place]) 11/10/2015  . Vitamin B12 deficiency 11/09/2015  . Essential hypertension 06/04/2014  . Vitamin D deficiency 09/24/2013  . Hyperlipidemia, mixed   . Cardiac pacemaker  08/26/2012  . S/P repair of patent ductus arteriosus 04/22/2011  . Tachycardia-bradycardia syndrome ([place]) 12/24/2010  . Atrial fibrillation ([place]) Chadsvasc 5 12/02/2010  . Malignant neoplasm of kidney excluding renal pelvis ([place]) 11/17/2007  . Hypothyroidism 11/17/2007    Screening Tests Immunization History  Administered Date(s) Administered  . Fluad Quad(high Dose 65+) 05/11/2019  . Influenza, High Dose Seasonal PF 07/18/2015, 06/23/2017, 06/27/2018, 06/30/2020  . Influenza,inj,Quad PF,6+ Mos 05/31/2014, 06/09/2016  . Influenza-Unspecified 09/16/2009, 07/03/2013  . Janssen (J&J) SARS-COV-2 Vaccination 12/12/2019, 07/10/2020  . Pneumococcal Conjugate-13 09/06/2002  . Pneumococcal Polysaccharide-23 07/18/2015  . Td 09/06/2002  . Zoster 09/06/2008    Preventative care: Last colonoscopy: 05/23/2020 Dr. Fuller Plan, colon cancer, 6 month follow up with Dr. Carlean Purl recommended in 11/2020 Last mammogram: 2015 declines Last pap smear/pelvic exam: 2004 declines another  DEXA: declines at this time  EGD 04/2020 ECHO 06/13/2020 - EF 55-60%  Prior vaccinations: TD or Tdap: 2004 declines  Influenza: 06/2020  Pneumococcal: 2016 Prevnar13: 2016 Shingles/Zostavax: 2010 Covid 19: J&J 12/2019, 07/10/2020  Names of Other Physician/Practitioners you currently use: 1. [facility] here for primary care 2. Dr. Kenton Kingfisher, Eye eye doctor in  [place], last visit 2021, glasses 3. Dr. Mikeal Hawthorne, dentist, last visit 2020  Patient Care Team: [name], MD as PCP - General (Internal Medicine) [name], [name], MD as PCP - Cardiology (Cardiology) Thompson Grayer, MD as PCP - Electrophysiology (Cardiology) [name], MD as Consulting Physician (Gastroenterology)  SURGICAL HISTORY She  has a past surgical history that includes Cardiac catheterization (2008); Nephrectomy (Left); Patent ductus arterious repair (~ 1949); Cholecystectomy (1970s); Total knee arthroplasty (02/07/2012); Total hip revision (Right, 2009); I & D knee with poly exchange (Right, 12/17/2013); Patellar tendon repair (Right, 03/06/2014); Total knee revision (Right, 07/29/2014); Colonoscopy (N/A, 11/11/2015); Esophagogastroduodenoscopy (N/A, 11/11/2015); Total abdominal hysterectomy (85); laparoscopy (N/A, 01/15/2016); Gastrectomy (N/A, 01/15/2016); Pacemaker insertion (07/04/2011); Esophagogastroduodenoscopy (N/A, 02/05/2016); Gastrectomy (Left, 02/24/2016); Esophagogastroduodenoscopy (N/A, 04/02/2016); Whipple procedure (N/A, 05/18/2016); Gastrojejunostomy (N/A, 05/18/2016); Lysis of adhesion (05/18/2016); ir generic historical (06/17/2016); Esophagogastroduodenoscopy (egd) with propofol (N/A, 04/06/2017); Transurethral resection of bladder tumor with mitomycin-c (N/A, 05/01/2018); Cystoscopy w/ retrogrades (Right, 05/01/2018); Transurethral resection of bladder tumor (N/A, 07/21/2018); Cystoscopy w/ retrogrades (Right, 07/21/2018); RIGHT/LEFT HEART CATH AND CORONARY ANGIOGRAPHY (N/A, 10/09/2019); Eye surgery (Bilateral); Transcatheter aortic valve replacement, carotid (Left, 10/30/2019); Transcatheter aortic valve replacement, carotid (Left, 10/30/2019); TEE without cardioversion (N/A, 10/30/2019); Cardioversion (N/A, 12/05/2019); TEE without cardioversion (N/A, 06/13/2020); and Cardioversion (N/A, 06/13/2020). FAMILY HISTORY Her family history includes Colon cancer (age of onset: 67) in her  mother; Heart attack in her father; Heart disease in her father, maternal grandmother, and mother; Stomach cancer in her brother. SOCIAL HISTORY She  reports that she has never smoked. She has never used smokeless tobacco. She reports that she does not drink alcohol and does not use drugs.   MEDICARE WELLNESS OBJECTIVES: Physical activity: Current Exercise Habits: Home exercise routine, Type of exercise: walking, Time (Minutes): 10, Frequency (Times/Week): 4, Weekly Exercise (Minutes/Week): 40, Intensity: Mild, Exercise limited by: orthopedic condition(s) Cardiac risk factors: Cardiac Risk Factors include: advanced age (>35men, >36 women);hypertension;sedentary lifestyle Depression/mood screen:   Depression screen PHQ  2/9 08/18/2020  Decreased Interest 0  Down, Depressed, Hopeless 0  PHQ - 2 Score 0  Some recent data might be hidden    ADLs:  In your present state of health, do you have any difficulty performing the following activities: 08/18/2020 03/04/2020  Hearing? N Y  Comment - hearing aid  Vision? N N  Difficulty concentrating or making decisions? N N  Walking or climbing stairs? Y Y  Comment uses rolling walker on flat surfaces, able to get up stairs using rails. unstable gait  Dressing or bathing? N N  Doing errands, shopping? Tempie Donning  Comment family drives requires transport  Some recent data might be hidden     Cognitive Testing  Alert? Yes  Normal Appearance?Yes  Oriented to person? Yes  Place? Yes   Time? Yes  Recall of three objects?  Yes  Can perform simple calculations? Yes  Displays appropriate judgment?Yes  Can read the correct time from a watch face?Yes  EOL planning: Does Patient Have a Medical Advance Directive?: Yes Type of Advance Directive: [place] will Does patient want to make changes to medical advance directive?: No - Patient declined Copy of [place] in Chart?: No - copy requested  Review of Systems   Constitutional: Negative.  Negative for malaise/fatigue and weight loss.  HENT: Negative.  Negative for hearing loss and tinnitus.   Eyes: Negative.  Negative for blurred vision and double vision.  Respiratory: Negative.  Negative for cough, shortness of breath and wheezing.   Cardiovascular: Negative.  Negative for chest pain, palpitations, orthopnea, claudication and leg swelling.  Gastrointestinal: Negative.  Negative for abdominal pain, blood in stool, constipation, diarrhea, heartburn, melena, nausea and vomiting.  Genitourinary: Negative.   Musculoskeletal: Negative.  Negative for joint pain and myalgias.  Skin: Positive for rash (extremities, chest, and back).  Neurological: Negative.  Negative for dizziness, tingling, sensory change, weakness and headaches.  Endo/Heme/Allergies: Negative.  Negative for polydipsia.  Psychiatric/Behavioral: Negative.   All other systems reviewed and are negative.    Objective:     Today's Vitals   08/18/20 1013  BP: (!) 160/60  Pulse: 77  Temp: (!) 97.5 F (36.4 C)  SpO2: 96%  Weight: 96 lb (43.5 kg)   Body mass index is 19.39 kg/m.  General appearance: alert, no distress, WD/WN, female HEENT: normocephalic, sclerae anicteric, TMs pearly, nares patent, no discharge or erythema, pharynx normal Oral cavity: MMM, no lesions Neck: supple, no lymphadenopathy, no thyromegaly, no masses Heart: RRR, normal S1, S2, no murmur, gallop, clicks Lungs: CTA bilaterally, no wheezes, rhonchi, or rales Abdomen: +bs, soft,  non tender, non distended, no masses, no hepatomegaly, no splenomegaly Musculoskeletal: nontender, no swelling, no obvious deformity Extremities: no edema, no cyanosis, no clubbing Pulses: 2+ symmetric, upper and lower extremities, normal cap refill Neurological: alert, oriented x 3, CN2-12 intact, strength normal upper extremities and lower extremities L>R with chronic R muscle wasting, no cerebellar signs, gait antalgic with  walker Psychiatric: normal affect, behavior normal, pleasant  Derm: scattered flat rash without discharge, some excoriation marks, mild petichiae to bil lower extremities           Medicare Attestation I have personally reviewed: The patient's medical and social history Their use of alcohol, tobacco or illicit drugs Their current medications and supplements The patient's functional ability including ADLs,fall risks, home safety risks, cognitive, and hearing and visual impairment Diet and physical activities Evidence for depression or mood disorders  The patient's weight,  height, BMI, and visual acuity have been recorded in the chart.  I have made referrals, counseling, and provided education to the patient based on review of the above and I have provided the patient with a written personalized care plan for preventive services.     Izora Ribas, NP   08/18/2020

## 2020-08-11 ENCOUNTER — Encounter: Payer: Self-pay | Admitting: Internal Medicine

## 2020-08-11 ENCOUNTER — Other Ambulatory Visit: Payer: Self-pay

## 2020-08-11 ENCOUNTER — Ambulatory Visit: Payer: Medicare Other | Admitting: Internal Medicine

## 2020-08-11 VITALS — BP 182/64 | HR 69 | Ht 59.0 in | Wt 97.4 lb

## 2020-08-11 DIAGNOSIS — D6869 Other thrombophilia: Secondary | ICD-10-CM | POA: Diagnosis not present

## 2020-08-11 DIAGNOSIS — I480 Paroxysmal atrial fibrillation: Secondary | ICD-10-CM

## 2020-08-11 DIAGNOSIS — I495 Sick sinus syndrome: Secondary | ICD-10-CM

## 2020-08-11 DIAGNOSIS — I10 Essential (primary) hypertension: Secondary | ICD-10-CM

## 2020-08-11 MED ORDER — LOSARTAN POTASSIUM 50 MG PO TABS: 50.0000 mg | ORAL_TABLET | Freq: Every day | ORAL | 3 refills | Status: DC

## 2020-08-11 NOTE — Patient Instructions (Addendum)
Medication Instructions:  1 increase your Losartan to 50 mg daily  *If you need a refill on your cardiac medications before your next appointment, please call your pharmacy*  Lab Work: None ordered.  If you have labs (blood work) drawn today and your tests are completely normal, you will receive your results only by: [name] MyChart Message (if you have MyChart) OR . A paper copy in the mail If you have any lab test that is abnormal or we need to change your treatment, we will call you to review the results.  Testing/Procedures: None ordered.  Follow-Up: At [facility], you and your health needs are our priority.  As part of our continuing mission to provide you with exceptional heart care, we have created designated Provider Care Teams.  These Care Teams include your primary Cardiologist (physician) and Advanced Practice Providers (APPs -  Physician Assistants and Nurse Practitioners) who all work together to provide you with the care you need, when you need it.  We recommend signing up for the patient portal called "MyChart".  Sign up information is provided on this After Visit Summary.  MyChart is used to connect with patients for Virtual Visits (Telemedicine).  Patients are able to view lab/test results, encounter notes, upcoming appointments, etc.  Non-urgent messages can be sent to your provider as well.   To learn more about what you can do with MyChart, go to [email or web address].    Your next appointment:   Your physician wants you to follow-up in: 1 year with [name]. You will receive a reminder letter in the mail two months in advance. If you don't receive a letter, please call our office to schedule the follow-up appointment.  Remote monitoring is used to monitor your Pacemaker from home. This monitoring reduces the number of office visits required to check your device to one time per year. It allows [place] to keep an eye on the functioning of your device to ensure it is working  properly. You are scheduled for a device check from home on 09/08/20. You may send your transmission at any time that day. If you have a wireless device, the transmission will be sent automatically. After your physician reviews your transmission, you will receive a postcard with your next transmission date.  Other Instructions:

## 2020-08-11 NOTE — Progress Notes (Signed)
PCP: [name], MD Primary Cardiologist: Dr [name] Primary EP: Dr Rayann Heman  [name] is a 86 y.o. female who presents today for routine electrophysiology followup.  Since last being seen in our clinic, the patient reports doing very well.  Today, she denies symptoms of palpitations, chest pain, shortness of breath,  lower extremity edema, dizziness, presyncope, or syncope.  The patient is otherwise without complaint today.   Past Medical History:  Diagnosis Date  . Adenocarcinoma of stomach ([place]) 11/11/2015   gastric mass on egd,   . Anal fissure   . Anemia    hx 3/17 iron infusion, on aranesp and injected B12 since 11/2015.   [name] Anxiety   . Aortic root dilatation ([place])   . Arthritis    oa  . Bladder cancer ([place])   . Blind left eye 2015   partial blind in left eye due to stroke   . 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 CAD (coronary artery disease)   . Cataracts, bilateral   . Chronic kidney disease    only has one kidney rt lft rem 03 ca     dr. Risa Grill, bladder cancer 05-04-2018  . Elevated LFTs    hepatic steatosis, none recent  . Endometrial cancer ([place])   . Gastroparesis   . GERD (gastroesophageal reflux disease)   . Hearing loss    wears hearing aids  . Heart murmur   . History of uterine cancer 1980s   treated with hysterectomy, external radiation and radiation seed implants.   [name] HTN (hypertension)   . Hyperlipemia   . Hypothyroidism   . Iron deficiency anemia due to chronic blood loss 11/24/2015  . Neuropathy    right leg  . Osteomyelitis ([place]) as child  . PAF (paroxysmal atrial fibrillation) ([place])   . Presence of permanent cardiac pacemaker   . Refusal of blood transfusions as patient is Jehovah's Witness   . Restless leg syndrome   . 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 Severe aortic stenosis   . Stroke [facility]) 2015   partial stroke left legally  blind; left eye   . Tachycardia-bradycardia syndrome ([place])    s/p PPM by Dr Doreatha Lew (MDT) 07/03/10  . 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 Walker as ambulation aid   . Wears glasses   . Wears partial dentures    upper   Past Surgical History:  Procedure Laterality Date  . CARDIAC CATHETERIZATION  2008  . CARDIOVERSION N/A 12/05/2019   Procedure: CARDIOVERSION;  Surgeon: Elouise Munroe, MD;  Location: [facility] ENDOSCOPY;  Service: Cardiovascular;  Laterality: N/A;  . CARDIOVERSION N/A 06/13/2020   Procedure: CARDIOVERSION;  Surgeon: Buford Dresser, MD;  Location: [facility] ENDOSCOPY;  Service: Cardiovascular;  Laterality: N/A;  . CHOLECYSTECTOMY  1970s  . COLONOSCOPY N/A 11/11/2015   Procedure: COLONOSCOPY;  Surgeon: [name], MD;  Location: [facility] ENDOSCOPY;  Service: Endoscopy;  Laterality: N/A;  . CYSTOSCOPY W/ RETROGRADES Right 05/01/2018   Procedure: CYSTOSCOPY WITH RIGHT RETROGRADE PYELOGRAM;  Surgeon: Lucas Mallow, MD;  Location: [facility] ORS;  Service: Urology;  Laterality: Right;  . CYSTOSCOPY W/ RETROGRADES Right 07/21/2018   Procedure: CYSTOSCOPY WITH RIGHT RETROGRADE PYELOGRAM;  Surgeon: Alexis Frock, MD;  [facility]: [facility] ORS;  Service: Urology;  Laterality: Right;  . ESOPHAGOGASTRODUODENOSCOPY N/A 11/11/2015   Procedure: ESOPHAGOGASTRODUODENOSCOPY (EGD);  Surgeon: [name],  MD;  [facility] ENDOSCOPY;  Service: Endoscopy;  Laterality: N/A;  . ESOPHAGOGASTRODUODENOSCOPY N/A 02/05/2016   Procedure: ESOPHAGOGASTRODUODENOSCOPY (EGD);  Surgeon: Ladene Artist, MD;  Location: [facility] ENDOSCOPY;  Service: Endoscopy;  Laterality: N/A;  . ESOPHAGOGASTRODUODENOSCOPY N/A 04/02/2016   Procedure: ESOPHAGOGASTRODUODENOSCOPY (EGD);  Surgeon: [name], MD;  Location: [facility] ENDOS[facility];  Service: Endoscopy;  Laterality: N/A;  . ESOPHAGOGASTRODUODENOSCOPY (EGD) WITH PROPOFOL N/A 04/06/2017   Procedure: ESOPHAGOGASTRODUODENOSCOPY (EGD) WITH PROPOFOL;  Surgeon: [name],  MD;  [facility] ENDOS[facility];  Service: Endoscopy;  Laterality: N/A;  . EYE SURGERY Bilateral    remove cataracts  . GASTRECTOMY N/A 01/15/2016   Procedure: OPEN PARTIAL GASTRECTOMY;  Surgeon: Stark Klein, MD;  Location: [place];  Service: General;  Laterality: N/A;  . GASTRECTOMY Left 02/24/2016   Procedure: OPEN J TUBE;  Surgeon: Stark Klein, MD;  Location: [place];  Service: General;  Laterality: Left;  [name] GASTROJEJUNOSTOMY N/A 05/18/2016   Procedure: ROUX EN [place];  Surgeon: Stark Klein, MD;  Location: [place];  Service: General;  Laterality: N/A;  . I & D KNEE WITH POLY EXCHANGE Right 12/17/2013   Procedure: IRRIGATION AND DEBRIDEMEN RIGHT TOTAL  KNEE WITH POLY EXCHANGE;  Surgeon: Mauri Pole, MD;  [facility];  Service: Orthopedics;  Laterality: Right;  . IR GENERIC HISTORICAL  06/17/2016   IR RADIOLOGIST EVAL & MGMT 06/17/2016 Ascencion Dike, PA-C [facility] INTERV RAD  . LAPAROSCOPY N/A 01/15/2016   Procedure: LAPAROSCOPY DIAGNOSTIC;  Surgeon: Stark Klein, MD;  Location: [place];  Service: General;  Laterality: N/A;  . LYSIS OF ADHESION  05/18/2016   Procedure: LYSIS OF ADHESION;  Surgeon: Stark Klein, MD;  Location: [place];  Service: General;;  . NEPHRECTOMY Left    renal cell cancer  . PACEMAKER INSERTION  07/04/2011   MDT PPM implanted by Dr Doreatha Lew for tachy/brady; managed by Dr Thompson Grayer   . PATELLAR TENDON REPAIR Right 03/06/2014   Procedure: AVULSION WITH PATELLA TENDON REPAIR;  Surgeon: Mauri Pole, MD;  [facility];  Service: Orthopedics;  Laterality: Right;  . PATENT DUCTUS ARTERIOUS REPAIR  ~ 1949  . RIGHT/LEFT HEART CATH AND CORONARY ANGIOGRAPHY N/A 10/09/2019   Procedure: RIGHT/LEFT HEART CATH AND CORONARY ANGIOGRAPHY;  Surgeon: [name], [name], MD;  Location: [place] CV LAB;  Service: Cardiovascular;  Laterality: N/A;  . TEE WITHOUT CARDIOVERSION N/A 10/30/2019   Procedure: TRANSESOPHAGEAL ECHOCARDIOGRAM (TEE);  Surgeon: Burnell Blanks, MD;   Location: [place];  Service: Open Heart Surgery;  Laterality: N/A;  . TEE WITHOUT CARDIOVERSION N/A 06/13/2020   Procedure: TRANSESOPHAGEAL ECHOCARDIOGRAM (TEE);  Surgeon: Buford Dresser, MD;  Location: [facility] ENDOSCOPY;  Service: Cardiovascular;  Laterality: N/A;  . TOTAL ABDOMINAL HYSTERECTOMY  85  . TOTAL HIP REVISION Right 2009   initial replacment surg  . TOTAL KNEE ARTHROPLASTY  02/07/2012   Procedure: TOTAL KNEE ARTHROPLASTY;  Surgeon: Mauri Pole, MD;  [facility];  Service: Orthopedics;  Laterality: Right;  . TOTAL KNEE REVISION Right 07/29/2014   Procedure: RIGHT KNEE REVISION OF PREVIOUS REPAIR EXTENSOR MECHANISM;  Surgeon: Mauri Pole, MD;  [facility];  Service: Orthopedics;  Laterality: Right;  . TRANSCATHETER AORTIC VALVE REPLACEMENT, CAROTID Left 10/30/2019   TRANSCATHETER AORTIC VALVE REPLACEMENT, LEFT CAROTID (Left Neck)   . TRANSCATHETER AORTIC VALVE REPLACEMENT, CAROTID Left 10/30/2019   Procedure: TRANSCATHETER AORTIC VALVE REPLACEMENT, LEFT CAROTID;  Surgeon: Burnell Blanks, MD;  Location: [place];  Service: Open Heart Surgery;  Laterality: Left;  . TRANSURETHRAL RESECTION OF BLADDER TUMOR N/A 07/21/2018   Procedure: TRANSURETHRAL RESECTION OF BLADDER TUMOR (TURBT);  Surgeon: Alexis Frock, MD;  [facility];  Service: Urology;  Laterality: N/A;  . TRANSURETHRAL RESECTION OF BLADDER TUMOR WITH MITOMYCIN-C N/A 05/01/2018   Procedure: TRANSURETHRAL RESECTION OF BLADDER TUMOR WITH POST OP INSTILLATION OF GEMCITABINE;  Surgeon: Lucas Mallow, MD;  [facility];  Service: Urology;  Laterality: N/A;  . WHIPPLE PROCEDURE N/A 05/18/2016   Procedure: EXPLORATORY LAPAROTOMY;  Surgeon: Stark Klein, MD;  Location: [place];  Service: General;  Laterality: N/A;    ROS- all systems are reviewed and negatives except as per HPI above  Current Outpatient Medications  Medication Sig Dispense Refill  . acetaminophen (TYLENOL) 500 MG tablet Take 1,000 mg by  mouth at bedtime.     . ALPRAZolam (XANAX) 0.5 MG tablet Take 0.5 mg by mouth 2 (two) times daily as needed for anxiety.     . Alum & Mag Hydroxide-Simeth (MYLANTA PO) Take 20 mLs by mouth every evening.     [name] amoxicillin (AMOXIL) 500 MG tablet Take 4 tablets (2,000 mg total) by mouth as directed. 1 hour prior to dental work including cleanings 12 tablet 12  . Ascorbic Acid (VITAMIN C) 1000 MG tablet Take 1,000 mg by mouth daily.    . Cholecalciferol (VITAMIN D-3) 5000 units TABS Take 5,000 Units by mouth 3 (three) times daily.     . Cyanocobalamin 5000 MCG SUBL Place 5,000 mcg under the tongue daily.     [name] erythromycin ophthalmic ointment Place 1 application into both eyes at bedtime as needed (stye).     . famotidine (PEPCID) 40 MG tablet TAKE 1 TABLET DAILY FOR INDIGESTION & HEARTBURN 90 tablet 3  . ferrous sulfate 325 (65 FE) MG tablet Take 325 mg by mouth daily in the afternoon.    . furosemide (LASIX) 20 MG tablet Take 1 tablet (20 mg total) by mouth daily. 90 tablet 3  . gabapentin (NEURONTIN) 300 MG capsule TAKE 1 CAPSULE BY MOUTH THREE TIMES A DAY 270 capsule 1  . hydrocortisone (ANUSOL-HC) 2.5 % rectal cream Place rectally 2 (two) times daily. 30 g 1  . levothyroxine (SYNTHROID) 100 MCG tablet TAKE 1/2 TABLET DAILY FOR THYROID. 45 tablet 1  . loperamide (IMODIUM A-D) 2 MG tablet Take up to 12 tablets /day as needed for  for Diarrhea 120 tablet 11  . loratadine (CLARITIN) 10 MG tablet Take 10 mg by mouth at bedtime.     [name] losartan (COZAAR) 25 MG tablet Take 1 tablet (25 mg total) by mouth daily. 90 tablet 3  . metoprolol succinate (TOPROL-XL) 50 MG 24 hr tablet Take 75mg  by mouth once a day Take with or immediately following a meal. 90 tablet 3  . nitroGLYCERIN (NITROSTAT) 0.4 MG SL tablet Dissolve 1 tablet under tongue every 5 minutes if needed for Angina 50 tablet prn  . ondansetron (ZOFRAN-ODT) 8 MG disintegrating tablet Dissolve 1 tablet under tongue every 6 to 8 hours  3 x /day if  needed for Nausea or Vomitting 30 tablet 3  . potassium chloride (KLOR-CON) 10 MEQ tablet Take 1 tablet (10 mEq total) by mouth daily. 90 tablet 3  . Propylene Glycol (SYSTANE COMPLETE) 0.6 % SOLN Place 1 drop into both eyes 3 (three) times daily as needed (irritation/dry eyes.).    [name] simethicone (MYLICON) 80 MG chewable tablet Chew 80 mg by mouth every 6 (six) hours as needed  for flatulence.    . sucralfate (CARAFATE) 1 g tablet Take 1-2 g by mouth See admin instructions. Take 2 tablets (2 g) by mouth in the morning & take 1 tablet (1g) by mouth at lunch    . tobramycin-dexamethasone (TOBRADEX) ophthalmic solution Place 1 drop into both eyes daily as needed (stye).     [name] triamcinolone cream (KENALOG) 0.1 % Apply 1 application topically 2 (two) times daily as needed (rash/skin irritation.).     [name] verapamil (CALAN-SR) 120 MG CR tablet TAKE 1 TABLET 2 X /DAY WITH A MEAL FOR BP & HEART RHYTHM 180 tablet 3  . warfarin (COUMADIN) 2 MG tablet Take      as directed       to Prevent Blood Clots 180 tablet 0   No current facility-administered medications for this visit.    Physical Exam: Vitals:   08/11/20 1519  BP: (!) 182/64  Pulse: 69  SpO2: 97%  Weight: 97 lb 6.4 oz (44.2 kg)  Height: 4\' 11"  (1.499 m)    GEN- The patient is well appearing, alert and oriented x 3 today.   Head- normocephalic, atraumatic Eyes-  Sclera clear, conjunctiva pink Ears- hearing intact Oropharynx- clear Lungs- Clear to ausculation bilaterally, normal work of breathing Heart- Regular rate and rhythm, no murmurs, rubs or gallops, PMI not laterally displaced GI- soft, NT, ND, + BS Extremities- no clubbing, cyanosis, or edema  Wt Readings from Last 3 Encounters:  08/11/20 97 lb 6.4 oz (44.2 kg)  07/16/20 93 lb 6.4 oz (42.4 kg)  06/16/20 99 lb 3.2 oz (45 kg)    EKG tracing ordered today is personally reviewed and shows sinus, QTc 499 msec, nonspecific ST/ T changes  Assessment and Plan:  1. Paroxysmal atrial  fibrillation afib burden is 7.1% (9% when I saw her last 12/2018) chads2vasc score is 7.   afib burden is stable off AADs.  She did not tolerate amiodarone.  Qt is too long for tikosyn or sotalol Not an ablation candidate Would plan rate control with medicines for now.  Could consider flecainide even though she has nonobstructive CAD as her only AAD option if needed.  Would not advise AV nodal ablation at this time.   2. Sinus bradycardia Normal pacemaker function See Pace Art report No changes today she is not device dependant today  3. HTN Elevated We reviewed her home journal today which also shows frequent elevations Increase losartan to 50mg  daily  Risks, benefits and potential toxicities for medications prescribed and/or refilled reviewed with patient today.   I have discussed care at length with her daughter (care giver) who is with her today for assistance.  Return to see EP PA in a year Follow-up with Dr [name] as scheduled and AF clinic when needed  Thompson Grayer MD, [facility] 08/11/2020 3:38 PM

## 2020-08-12 ENCOUNTER — Ambulatory Visit (HOSPITAL_COMMUNITY): Payer: Medicare Other | Admitting: Physician Assistant

## 2020-08-18 ENCOUNTER — Ambulatory Visit (INDEPENDENT_AMBULATORY_CARE_PROVIDER_SITE_OTHER): Payer: Medicare Other | Admitting: Adult Health

## 2020-08-18 ENCOUNTER — Other Ambulatory Visit: Payer: Self-pay

## 2020-08-18 ENCOUNTER — Encounter: Payer: Self-pay | Admitting: Adult Health

## 2020-08-18 VITALS — BP 160/60 | HR 77 | Temp 97.5°F | Wt 96.0 lb

## 2020-08-18 DIAGNOSIS — R6889 Other general symptoms and signs: Secondary | ICD-10-CM

## 2020-08-18 DIAGNOSIS — E039 Hypothyroidism, unspecified: Secondary | ICD-10-CM

## 2020-08-18 DIAGNOSIS — E559 Vitamin D deficiency, unspecified: Secondary | ICD-10-CM

## 2020-08-18 DIAGNOSIS — Z8673 Personal history of transient ischemic attack (TIA), and cerebral infarction without residual deficits: Secondary | ICD-10-CM

## 2020-08-18 DIAGNOSIS — D500 Iron deficiency anemia secondary to blood loss (chronic): Secondary | ICD-10-CM | POA: Diagnosis not present

## 2020-08-18 DIAGNOSIS — F419 Anxiety disorder, unspecified: Secondary | ICD-10-CM

## 2020-08-18 DIAGNOSIS — N1831 Chronic kidney disease, stage 3a: Secondary | ICD-10-CM

## 2020-08-18 DIAGNOSIS — D6869 Other thrombophilia: Secondary | ICD-10-CM | POA: Diagnosis not present

## 2020-08-18 DIAGNOSIS — I5032 Chronic diastolic (congestive) heart failure: Secondary | ICD-10-CM | POA: Diagnosis not present

## 2020-08-18 DIAGNOSIS — E782 Mixed hyperlipidemia: Secondary | ICD-10-CM | POA: Diagnosis not present

## 2020-08-18 DIAGNOSIS — I4891 Unspecified atrial fibrillation: Secondary | ICD-10-CM

## 2020-08-18 DIAGNOSIS — Z903 Acquired absence of stomach [part of]: Secondary | ICD-10-CM

## 2020-08-18 DIAGNOSIS — K911 Postgastric surgery syndromes: Secondary | ICD-10-CM

## 2020-08-18 DIAGNOSIS — K2100 Gastro-esophageal reflux disease with esophagitis, without bleeding: Secondary | ICD-10-CM

## 2020-08-18 DIAGNOSIS — Z681 Body mass index (BMI) 19 or less, adult: Secondary | ICD-10-CM

## 2020-08-18 DIAGNOSIS — Z952 Presence of prosthetic heart valve: Secondary | ICD-10-CM

## 2020-08-18 DIAGNOSIS — Z0001 Encounter for general adult medical examination with abnormal findings: Secondary | ICD-10-CM

## 2020-08-18 DIAGNOSIS — I739 Peripheral vascular disease, unspecified: Secondary | ICD-10-CM

## 2020-08-18 DIAGNOSIS — C649 Malignant neoplasm of unspecified kidney, except renal pelvis: Secondary | ICD-10-CM

## 2020-08-18 DIAGNOSIS — I700 Atherosclerosis of aorta: Secondary | ICD-10-CM

## 2020-08-18 DIAGNOSIS — I712 Thoracic aortic aneurysm, without rupture, unspecified: Secondary | ICD-10-CM

## 2020-08-18 DIAGNOSIS — Z7901 Long term (current) use of anticoagulants: Secondary | ICD-10-CM

## 2020-08-18 DIAGNOSIS — Z8774 Personal history of (corrected) congenital malformations of heart and circulatory system: Secondary | ICD-10-CM

## 2020-08-18 DIAGNOSIS — Z531 Procedure and treatment not carried out because of patient's decision for reasons of belief and group pressure: Secondary | ICD-10-CM

## 2020-08-18 DIAGNOSIS — I495 Sick sinus syndrome: Secondary | ICD-10-CM | POA: Diagnosis not present

## 2020-08-18 DIAGNOSIS — C679 Malignant neoplasm of bladder, unspecified: Secondary | ICD-10-CM

## 2020-08-18 DIAGNOSIS — E538 Deficiency of other specified B group vitamins: Secondary | ICD-10-CM

## 2020-08-18 DIAGNOSIS — R21 Rash and other nonspecific skin eruption: Secondary | ICD-10-CM

## 2020-08-18 DIAGNOSIS — Z950 Presence of cardiac pacemaker: Secondary | ICD-10-CM

## 2020-08-18 DIAGNOSIS — C182 Malignant neoplasm of ascending colon: Secondary | ICD-10-CM

## 2020-08-18 DIAGNOSIS — Z0000 Encounter for general adult medical examination without abnormal findings: Secondary | ICD-10-CM

## 2020-08-18 DIAGNOSIS — C169 Malignant neoplasm of stomach, unspecified: Secondary | ICD-10-CM

## 2020-08-18 DIAGNOSIS — IMO0001 Reserved for inherently not codable concepts without codable children: Secondary | ICD-10-CM

## 2020-08-18 MED ORDER — GABAPENTIN 300 MG PO CAPS: ORAL_CAPSULE | ORAL | 1 refills | Status: AC

## 2020-08-18 MED ORDER — LEVOTHYROXINE SODIUM 100 MCG PO TABS: ORAL_TABLET | ORAL | 1 refills | Status: DC

## 2020-08-18 MED ORDER — ALPRAZOLAM 0.25 MG PO TABS: 0.1250 mg | ORAL_TABLET | Freq: Two times a day (BID) | ORAL | 0 refills | Status: AC | PRN

## 2020-08-18 MED ORDER — PRAVASTATIN SODIUM 20 MG PO TABS: 20.0000 mg | ORAL_TABLET | Freq: Every day | ORAL | 1 refills | Status: DC

## 2020-08-18 MED ORDER — PREDNISONE 20 MG PO TABS
ORAL_TABLET | ORAL | 0 refills | Status: DC
Start: 2020-08-18 — End: 2020-10-06

## 2020-08-18 NOTE — Patient Instructions (Signed)
[name] , Thank you for taking time to come for your Medicare Wellness Visit. I appreciate your ongoing commitment to your health goals. Please review the following plan we discussed and let me know if I can assist you in the future.   This is a list of the screening recommended for you and due dates:  Health Maintenance  Topic Date Due  . DEXA scan (bone density measurement)  Never done  . Tetanus Vaccine  08/11/2026  . Flu Shot  Completed  . COVID-19 Vaccine  Completed  . Pneumonia vaccines  Completed        Pravastatin tablets What is this medicine? PRAVASTATIN (PRA va stat in) is known as a HMG-CoA reductase inhibitor or 'statin'. It lowers the level of cholesterol and triglycerides in the blood. This drug may also reduce the risk of heart attack, stroke, or other health problems in patients with risk factors for heart disease. Diet and lifestyle changes are often used with this drug. This medicine may be used for other purposes; ask your health care provider or pharmacist if you have questions. COMMON BRAND NAME(S): Pravachol What should I tell my health care provider before I take this medicine? They need to know if you have any of these conditions:  diabetes  if you often drink alcohol  history of stroke  kidney disease  liver disease  muscle aches or weakness  thyroid disease  an unusual or allergic reaction to pravastatin, other medicines, foods, dyes, or preservatives  pregnant or trying to get pregnant  breast-feeding How should I use this medicine? Take pravastatin tablets by mouth. Swallow the tablets with a drink of water. Pravastatin can be taken at anytime of the day, with or without food. Follow the directions on the prescription label. Take your doses at regular intervals. Do not take your medicine more often than directed. Talk to your pediatrician regarding the use of this medicine in children. Special care may be needed. Pravastatin has been  used in children as young as 65 years of age. Overdosage: If you think you have taken too much of this medicine contact a poison control center or emergency room at once. NOTE: This medicine is only for you. Do not share this medicine with others. What if I miss a dose? If you miss a dose, take it as soon as you can. If it is almost time for your next dose, take only that dose. Do not take double or extra doses. What may interact with this medicine? This medicine may interact with the following medications:  colchicine  cyclosporine  other medicines for high cholesterol  some antibiotics like azithromycin, clarithromycin, erythromycin, and telithromycin This list may not describe all possible interactions. Give your health care provider a list of all the medicines, herbs, non-prescription drugs, or dietary supplements you use. Also tell them if you smoke, drink alcohol, or use illegal drugs. Some items may interact with your medicine. What should I watch for while using this medicine? Visit your doctor or health care professional for regular check-ups. You may need regular tests to make sure your liver is working properly. Your health care professional may tell you to stop taking this medicine if you develop muscle problems. If your muscle problems do not go away after stopping this medicine, contact your health care professional. Do not become pregnant while taking this medicine. Women should inform their health care professional if they wish to become pregnant or think they might be pregnant. There is a  potential for serious side effects to an unborn child. Talk to your health care professional or pharmacist for more information. Do not breast-feed an infant while taking this medicine. This medicine may affect blood sugar levels. If you have diabetes, check with your doctor or health care professional before you change your diet or the dose of your diabetic medicine. If you are going to need  surgery or other procedure, tell your doctor that you are using this medicine. This drug is only part of a total heart-health program. Your doctor or a dietician can suggest a low-cholesterol and low-fat diet to help. Avoid alcohol and smoking, and keep a proper exercise schedule. This medicine may cause a decrease in Co-Enzyme Q-10. You should make sure that you get enough Co-Enzyme Q-10 while you are taking this medicine. Discuss the foods you eat and the vitamins you take with your health care professional. What side effects may I notice from receiving this medicine? Side effects that you should report to your doctor or health care professional as soon as possible:  allergic reactions like skin rash, itching or hives, swelling of the face, lips, or tongue  dark urine  fever  muscle pain, cramps, or weakness  redness, blistering, peeling or loosening of the skin, including inside the mouth  trouble passing urine or change in the amount of urine  unusually weak or tired  yellowing of the eyes or skin Side effects that usually do not require medical attention (report to your doctor or health care professional if they continue or are bothersome):  gas  headache  heartburn  indigestion  stomach pain This list may not describe all possible side effects. Call your doctor for medical advice about side effects. You may report side effects to FDA at 1-800-FDA-1088. Where should I keep my medicine? Keep out of the reach of children. Store at room temperature between 15 to 30 degrees C (59 to 86 degrees F). Protect from light. Keep container tightly closed. Throw away any unused medicine after the expiration date. NOTE: This sheet is a summary. It may not cover all possible information. If you have questions about this medicine, talk to your doctor, pharmacist, or health care provider.  2020 Elsevier/Gold Standard (2017-04-26 12:37:09)

## 2020-08-19 ENCOUNTER — Other Ambulatory Visit: Payer: Self-pay | Admitting: Adult Health

## 2020-08-19 ENCOUNTER — Telehealth: Payer: Self-pay | Admitting: Pharmacist

## 2020-08-19 DIAGNOSIS — R7989 Other specified abnormal findings of blood chemistry: Secondary | ICD-10-CM | POA: Insufficient documentation

## 2020-08-19 LAB — CBC WITH DIFFERENTIAL/PLATELET
Absolute Monocytes: 383 cells/uL (ref 200–950)
Basophils Absolute: 22 cells/uL (ref 0–200)
Basophils Relative: 0.4 %
Eosinophils Absolute: 151 cells/uL (ref 15–500)
Eosinophils Relative: 2.8 %
HCT: 38.1 % (ref 35.0–45.0)
Hemoglobin: 12.7 g/dL (ref 11.7–15.5)
Lymphs Abs: 837 cells/uL — ABNORMAL LOW (ref 850–3900)
MCH: 33.2 pg — ABNORMAL HIGH (ref 27.0–33.0)
MCHC: 33.3 g/dL (ref 32.0–36.0)
MCV: 99.5 fL (ref 80.0–100.0)
MPV: 11.3 fL (ref 7.5–12.5)
Monocytes Relative: 7.1 %
Neutro Abs: 4007 cells/uL (ref 1500–7800)
Neutrophils Relative %: 74.2 %
Platelets: 149 10*3/uL (ref 140–400)
RBC: 3.83 10*6/uL (ref 3.80–5.10)
RDW: 12.8 % (ref 11.0–15.0)
Total Lymphocyte: 15.5 %
WBC: 5.4 10*3/uL (ref 3.8–10.8)

## 2020-08-19 LAB — LIPID PANEL
Cholesterol: 131 mg/dL (ref <200)
HDL: 41 mg/dL — ABNORMAL LOW (ref >=50)
LDL Cholesterol (Calc): 71 mg/dL (calc)
Non-HDL Cholesterol (Calc): 90 mg/dL (calc) (ref <130)
Total CHOL/HDL Ratio: 3.2 (calc) (ref <5.0)
Triglycerides: 107 mg/dL (ref <150)

## 2020-08-19 LAB — TSH: TSH: 1.27 mIU/L (ref 0.40–4.50)

## 2020-08-19 LAB — IRON,TIBC AND FERRITIN PANEL
%SAT: 16 % (calc) (ref 16–45)
Ferritin: 80 ng/mL (ref 16–288)
Iron: 57 ug/dL (ref 45–160)
TIBC: 349 mcg/dL (calc) (ref 250–450)

## 2020-08-19 LAB — COMPLETE METABOLIC PANEL WITH GFR
AG Ratio: 1.2 (calc) (ref 1.0–2.5)
ALT: 60 U/L — ABNORMAL HIGH (ref 6–29)
AST: 27 U/L (ref 10–35)
Albumin: 3.6 g/dL (ref 3.6–5.1)
Alkaline phosphatase (APISO): 165 U/L — ABNORMAL HIGH (ref 37–153)
BUN/Creatinine Ratio: 20 (calc) (ref 6–22)
BUN: 18 mg/dL (ref 7–25)
CO2: 25 mmol/L (ref 20–32)
Calcium: 9.3 mg/dL (ref 8.6–10.4)
Chloride: 107 mmol/L (ref 98–110)
Creat: 0.89 mg/dL — ABNORMAL HIGH (ref 0.60–0.88)
GFR, Est African American: 69 mL/min/{1.73_m2} (ref >=60)
GFR, Est Non African American: 60 mL/min/{1.73_m2} (ref >=60)
Globulin: 3.1 g/dL (calc) (ref 1.9–3.7)
Glucose, Bld: 75 mg/dL (ref 65–99)
Potassium: 4 mmol/L (ref 3.5–5.3)
Sodium: 143 mmol/L (ref 135–146)
Total Bilirubin: 0.6 mg/dL (ref 0.2–1.2)
Total Protein: 6.7 g/dL (ref 6.1–8.1)

## 2020-08-19 LAB — MAGNESIUM: Magnesium: 1.8 mg/dL (ref 1.5–2.5)

## 2020-08-19 LAB — C3 AND C4
C3 Complement: 111 mg/dL
C4 Complement: 27 mg/dL

## 2020-08-19 LAB — ANTI-SMITH ANTIBODY: ENA SM Ab Ser-aCnc: <1 AI

## 2020-08-19 NOTE — Telephone Encounter (Signed)
Pt's daughter called clinic, states pt is starting on prednisone today, 40mg  x3 days then 20mg  for 4 days. Currently scheduled to have INR checked tomorrow. Will move this out to Friday. Called GG with [facility] [phone] who is aware to check INR on Friday to allow more time for drug interaction.

## 2020-08-22 ENCOUNTER — Ambulatory Visit (INDEPENDENT_AMBULATORY_CARE_PROVIDER_SITE_OTHER): Payer: Medicare Other | Admitting: Interventional Cardiology

## 2020-08-22 DIAGNOSIS — I5033 Acute on chronic diastolic (congestive) heart failure: Secondary | ICD-10-CM | POA: Diagnosis not present

## 2020-08-22 DIAGNOSIS — E875 Hyperkalemia: Secondary | ICD-10-CM | POA: Diagnosis not present

## 2020-08-22 DIAGNOSIS — I272 Pulmonary hypertension, unspecified: Secondary | ICD-10-CM | POA: Diagnosis not present

## 2020-08-22 DIAGNOSIS — J301 Allergic rhinitis due to pollen: Secondary | ICD-10-CM | POA: Diagnosis not present

## 2020-08-22 DIAGNOSIS — E559 Vitamin D deficiency, unspecified: Secondary | ICD-10-CM | POA: Diagnosis not present

## 2020-08-22 DIAGNOSIS — Z85028 Personal history of other malignant neoplasm of stomach: Secondary | ICD-10-CM | POA: Diagnosis not present

## 2020-08-22 DIAGNOSIS — G5791 Unspecified mononeuropathy of right lower limb: Secondary | ICD-10-CM | POA: Diagnosis not present

## 2020-08-22 DIAGNOSIS — Z85038 Personal history of other malignant neoplasm of large intestine: Secondary | ICD-10-CM | POA: Diagnosis not present

## 2020-08-22 DIAGNOSIS — I130 Hypertensive heart and chronic kidney disease with heart failure and stage 1 through stage 4 chronic kidney disease, or unspecified chronic kidney disease: Secondary | ICD-10-CM | POA: Diagnosis not present

## 2020-08-22 DIAGNOSIS — H6523 Chronic serous otitis media, bilateral: Secondary | ICD-10-CM | POA: Diagnosis not present

## 2020-08-22 DIAGNOSIS — I2510 Atherosclerotic heart disease of native coronary artery without angina pectoris: Secondary | ICD-10-CM | POA: Diagnosis not present

## 2020-08-22 DIAGNOSIS — Z7901 Long term (current) use of anticoagulants: Secondary | ICD-10-CM

## 2020-08-22 DIAGNOSIS — E039 Hypothyroidism, unspecified: Secondary | ICD-10-CM | POA: Diagnosis not present

## 2020-08-22 DIAGNOSIS — I081 Rheumatic disorders of both mitral and tricuspid valves: Secondary | ICD-10-CM | POA: Diagnosis not present

## 2020-08-22 DIAGNOSIS — Z5181 Encounter for therapeutic drug level monitoring: Secondary | ICD-10-CM | POA: Diagnosis not present

## 2020-08-22 DIAGNOSIS — Z952 Presence of prosthetic heart valve: Secondary | ICD-10-CM | POA: Diagnosis not present

## 2020-08-22 DIAGNOSIS — H5462 Unqualified visual loss, left eye, normal vision right eye: Secondary | ICD-10-CM | POA: Diagnosis not present

## 2020-08-22 DIAGNOSIS — G2581 Restless legs syndrome: Secondary | ICD-10-CM | POA: Diagnosis not present

## 2020-08-22 DIAGNOSIS — K3184 Gastroparesis: Secondary | ICD-10-CM | POA: Diagnosis not present

## 2020-08-22 DIAGNOSIS — N183 Chronic kidney disease, stage 3 unspecified: Secondary | ICD-10-CM | POA: Diagnosis not present

## 2020-08-22 DIAGNOSIS — K219 Gastro-esophageal reflux disease without esophagitis: Secondary | ICD-10-CM | POA: Diagnosis not present

## 2020-08-22 DIAGNOSIS — Z950 Presence of cardiac pacemaker: Secondary | ICD-10-CM | POA: Diagnosis not present

## 2020-08-22 DIAGNOSIS — I495 Sick sinus syndrome: Secondary | ICD-10-CM | POA: Diagnosis not present

## 2020-08-22 DIAGNOSIS — I480 Paroxysmal atrial fibrillation: Secondary | ICD-10-CM | POA: Diagnosis not present

## 2020-08-22 LAB — POCT INR: INR: 5.3 — AB (ref 2.0–3.0)

## 2020-08-22 NOTE — Patient Instructions (Signed)
Description   Spoke with [name] from [place] advised for pt to skip warfarin today, tomorrow, and Sunday. Take her normal 2mg  dose on Monday and recheck INR on Tuesday (last dose prednisone on Monday). Normal dose - 1 tablet daily except for 1/2 tablet on Sundays.    [name]'s # [phone].

## 2020-08-26 ENCOUNTER — Ambulatory Visit (INDEPENDENT_AMBULATORY_CARE_PROVIDER_SITE_OTHER): Payer: Medicare Other | Admitting: *Deleted

## 2020-08-26 DIAGNOSIS — Z85038 Personal history of other malignant neoplasm of large intestine: Secondary | ICD-10-CM | POA: Diagnosis not present

## 2020-08-26 DIAGNOSIS — I2510 Atherosclerotic heart disease of native coronary artery without angina pectoris: Secondary | ICD-10-CM | POA: Diagnosis not present

## 2020-08-26 DIAGNOSIS — I495 Sick sinus syndrome: Secondary | ICD-10-CM | POA: Diagnosis not present

## 2020-08-26 DIAGNOSIS — Z950 Presence of cardiac pacemaker: Secondary | ICD-10-CM | POA: Diagnosis not present

## 2020-08-26 DIAGNOSIS — H5462 Unqualified visual loss, left eye, normal vision right eye: Secondary | ICD-10-CM | POA: Diagnosis not present

## 2020-08-26 DIAGNOSIS — Z7901 Long term (current) use of anticoagulants: Secondary | ICD-10-CM | POA: Diagnosis not present

## 2020-08-26 DIAGNOSIS — Z5181 Encounter for therapeutic drug level monitoring: Secondary | ICD-10-CM | POA: Diagnosis not present

## 2020-08-26 DIAGNOSIS — K219 Gastro-esophageal reflux disease without esophagitis: Secondary | ICD-10-CM | POA: Diagnosis not present

## 2020-08-26 DIAGNOSIS — E559 Vitamin D deficiency, unspecified: Secondary | ICD-10-CM | POA: Diagnosis not present

## 2020-08-26 DIAGNOSIS — I480 Paroxysmal atrial fibrillation: Secondary | ICD-10-CM | POA: Diagnosis not present

## 2020-08-26 DIAGNOSIS — I130 Hypertensive heart and chronic kidney disease with heart failure and stage 1 through stage 4 chronic kidney disease, or unspecified chronic kidney disease: Secondary | ICD-10-CM | POA: Diagnosis not present

## 2020-08-26 DIAGNOSIS — I4891 Unspecified atrial fibrillation: Secondary | ICD-10-CM

## 2020-08-26 DIAGNOSIS — I081 Rheumatic disorders of both mitral and tricuspid valves: Secondary | ICD-10-CM | POA: Diagnosis not present

## 2020-08-26 DIAGNOSIS — I5033 Acute on chronic diastolic (congestive) heart failure: Secondary | ICD-10-CM | POA: Diagnosis not present

## 2020-08-26 DIAGNOSIS — I272 Pulmonary hypertension, unspecified: Secondary | ICD-10-CM | POA: Diagnosis not present

## 2020-08-26 DIAGNOSIS — N183 Chronic kidney disease, stage 3 unspecified: Secondary | ICD-10-CM | POA: Diagnosis not present

## 2020-08-26 DIAGNOSIS — H6523 Chronic serous otitis media, bilateral: Secondary | ICD-10-CM | POA: Diagnosis not present

## 2020-08-26 DIAGNOSIS — G2581 Restless legs syndrome: Secondary | ICD-10-CM | POA: Diagnosis not present

## 2020-08-26 DIAGNOSIS — Z952 Presence of prosthetic heart valve: Secondary | ICD-10-CM | POA: Diagnosis not present

## 2020-08-26 DIAGNOSIS — E875 Hyperkalemia: Secondary | ICD-10-CM | POA: Diagnosis not present

## 2020-08-26 DIAGNOSIS — K3184 Gastroparesis: Secondary | ICD-10-CM | POA: Diagnosis not present

## 2020-08-26 DIAGNOSIS — J301 Allergic rhinitis due to pollen: Secondary | ICD-10-CM | POA: Diagnosis not present

## 2020-08-26 DIAGNOSIS — E039 Hypothyroidism, unspecified: Secondary | ICD-10-CM | POA: Diagnosis not present

## 2020-08-26 DIAGNOSIS — G5791 Unspecified mononeuropathy of right lower limb: Secondary | ICD-10-CM | POA: Diagnosis not present

## 2020-08-26 DIAGNOSIS — Z85028 Personal history of other malignant neoplasm of stomach: Secondary | ICD-10-CM | POA: Diagnosis not present

## 2020-08-26 LAB — POCT INR: INR: 1.3 — AB (ref 2.0–3.0)

## 2020-08-26 NOTE — Patient Instructions (Signed)
Description   Spoke with Apolonio Schneiders Junita Push) from [place] RN advised for pt to take 1.5 tablets (3mg ) today and tomorrow then continue taking 1 tablet daily except for 1/2 tablet on Sundays.    [place]'s # [phone].

## 2020-09-02 ENCOUNTER — Ambulatory Visit (INDEPENDENT_AMBULATORY_CARE_PROVIDER_SITE_OTHER): Payer: Medicare Other | Admitting: *Deleted

## 2020-09-02 DIAGNOSIS — Z5181 Encounter for therapeutic drug level monitoring: Secondary | ICD-10-CM | POA: Diagnosis not present

## 2020-09-02 DIAGNOSIS — I495 Sick sinus syndrome: Secondary | ICD-10-CM | POA: Diagnosis not present

## 2020-09-02 DIAGNOSIS — E559 Vitamin D deficiency, unspecified: Secondary | ICD-10-CM | POA: Diagnosis not present

## 2020-09-02 DIAGNOSIS — Z85038 Personal history of other malignant neoplasm of large intestine: Secondary | ICD-10-CM | POA: Diagnosis not present

## 2020-09-02 DIAGNOSIS — K3184 Gastroparesis: Secondary | ICD-10-CM | POA: Diagnosis not present

## 2020-09-02 DIAGNOSIS — Z85028 Personal history of other malignant neoplasm of stomach: Secondary | ICD-10-CM | POA: Diagnosis not present

## 2020-09-02 DIAGNOSIS — G5791 Unspecified mononeuropathy of right lower limb: Secondary | ICD-10-CM | POA: Diagnosis not present

## 2020-09-02 DIAGNOSIS — I4891 Unspecified atrial fibrillation: Secondary | ICD-10-CM | POA: Diagnosis not present

## 2020-09-02 DIAGNOSIS — N183 Chronic kidney disease, stage 3 unspecified: Secondary | ICD-10-CM | POA: Diagnosis not present

## 2020-09-02 DIAGNOSIS — Z7901 Long term (current) use of anticoagulants: Secondary | ICD-10-CM

## 2020-09-02 DIAGNOSIS — I081 Rheumatic disorders of both mitral and tricuspid valves: Secondary | ICD-10-CM | POA: Diagnosis not present

## 2020-09-02 DIAGNOSIS — K219 Gastro-esophageal reflux disease without esophagitis: Secondary | ICD-10-CM | POA: Diagnosis not present

## 2020-09-02 DIAGNOSIS — I130 Hypertensive heart and chronic kidney disease with heart failure and stage 1 through stage 4 chronic kidney disease, or unspecified chronic kidney disease: Secondary | ICD-10-CM | POA: Diagnosis not present

## 2020-09-02 DIAGNOSIS — E039 Hypothyroidism, unspecified: Secondary | ICD-10-CM | POA: Diagnosis not present

## 2020-09-02 DIAGNOSIS — I5033 Acute on chronic diastolic (congestive) heart failure: Secondary | ICD-10-CM | POA: Diagnosis not present

## 2020-09-02 DIAGNOSIS — H5462 Unqualified visual loss, left eye, normal vision right eye: Secondary | ICD-10-CM | POA: Diagnosis not present

## 2020-09-02 DIAGNOSIS — H6523 Chronic serous otitis media, bilateral: Secondary | ICD-10-CM | POA: Diagnosis not present

## 2020-09-02 DIAGNOSIS — Z950 Presence of cardiac pacemaker: Secondary | ICD-10-CM | POA: Diagnosis not present

## 2020-09-02 DIAGNOSIS — Z952 Presence of prosthetic heart valve: Secondary | ICD-10-CM | POA: Diagnosis not present

## 2020-09-02 DIAGNOSIS — J301 Allergic rhinitis due to pollen: Secondary | ICD-10-CM | POA: Diagnosis not present

## 2020-09-02 DIAGNOSIS — I2510 Atherosclerotic heart disease of native coronary artery without angina pectoris: Secondary | ICD-10-CM | POA: Diagnosis not present

## 2020-09-02 DIAGNOSIS — I272 Pulmonary hypertension, unspecified: Secondary | ICD-10-CM | POA: Diagnosis not present

## 2020-09-02 DIAGNOSIS — I480 Paroxysmal atrial fibrillation: Secondary | ICD-10-CM | POA: Diagnosis not present

## 2020-09-02 DIAGNOSIS — E875 Hyperkalemia: Secondary | ICD-10-CM | POA: Diagnosis not present

## 2020-09-02 DIAGNOSIS — G2581 Restless legs syndrome: Secondary | ICD-10-CM | POA: Diagnosis not present

## 2020-09-02 LAB — POCT INR: INR: 2.9 (ref 2.0–3.0)

## 2020-09-02 NOTE — Patient Instructions (Signed)
Description   Spoke with [name] [name]) from [facility] RN advised for pt to continue taking 1 tablet daily except for 1/2 tablet on Sundays.    [place]'s # [phone].

## 2020-09-08 ENCOUNTER — Ambulatory Visit (INDEPENDENT_AMBULATORY_CARE_PROVIDER_SITE_OTHER): Payer: Medicare Other

## 2020-09-08 ENCOUNTER — Telehealth: Payer: Self-pay

## 2020-09-08 ENCOUNTER — Other Ambulatory Visit: Payer: Self-pay | Admitting: Adult Health

## 2020-09-08 DIAGNOSIS — I495 Sick sinus syndrome: Secondary | ICD-10-CM

## 2020-09-08 DIAGNOSIS — R21 Rash and other nonspecific skin eruption: Secondary | ICD-10-CM

## 2020-09-08 NOTE — Telephone Encounter (Signed)
Patient states that the steroids helped the redness and bumps on her skin but now that she had finished the medication, its back. States that the steroid interferes with her coumadin. BP is also all over the place.

## 2020-09-09 ENCOUNTER — Other Ambulatory Visit: Payer: Self-pay

## 2020-09-09 ENCOUNTER — Ambulatory Visit (INDEPENDENT_AMBULATORY_CARE_PROVIDER_SITE_OTHER): Payer: Medicare Other

## 2020-09-09 VITALS — BP 142/62 | HR 86 | Temp 97.9°F | Wt 99.2 lb

## 2020-09-09 DIAGNOSIS — R7989 Other specified abnormal findings of blood chemistry: Secondary | ICD-10-CM

## 2020-09-09 DIAGNOSIS — R079 Chest pain, unspecified: Secondary | ICD-10-CM

## 2020-09-09 DIAGNOSIS — I482 Chronic atrial fibrillation, unspecified: Secondary | ICD-10-CM

## 2020-09-09 DIAGNOSIS — I4891 Unspecified atrial fibrillation: Secondary | ICD-10-CM

## 2020-09-09 LAB — HEPATIC FUNCTION PANEL
AG Ratio: 1.6 (calc) (ref 1.0–2.5)
ALT: 16 U/L (ref 6–29)
AST: 17 U/L (ref 10–35)
Albumin: 3.9 g/dL (ref 3.6–5.1)
Alkaline phosphatase (APISO): 107 U/L (ref 37–153)
Bilirubin, Direct: 0.2 mg/dL (ref 0.0–0.2)
Globulin: 2.5 g/dL (calc) (ref 1.9–3.7)
Indirect Bilirubin: 0.4 mg/dL (calc) (ref 0.2–1.2)
Total Bilirubin: 0.6 mg/dL (ref 0.2–1.2)
Total Protein: 6.4 g/dL (ref 6.1–8.1)

## 2020-09-09 LAB — CUP PACEART REMOTE DEVICE CHECK
Battery Impedance: 2911 Ohm
Battery Remaining Longevity: 21 mo
Battery Voltage: 2.72 V
Brady Statistic AP VP Percent: 0 %
Brady Statistic AP VS Percent: 21 %
Brady Statistic AS VP Percent: 1 %
Brady Statistic AS VS Percent: 78 %
Date Time Interrogation Session: 20220104095640
Implantable Lead Implant Date: 20111027
Implantable Lead Implant Date: 20111027
Implantable Lead Location: 753859
Implantable Lead Location: 753860
Implantable Lead Model: 4469
Implantable Lead Model: 4470
Implantable Lead Serial Number: 548229
Implantable Lead Serial Number: 686026
Implantable Pulse Generator Implant Date: 20111027
Lead Channel Impedance Value: 396 Ohm
Lead Channel Impedance Value: 475 Ohm
Lead Channel Pacing Threshold Amplitude: 0.375 V
Lead Channel Pacing Threshold Amplitude: 1.375 V
Lead Channel Pacing Threshold Pulse Width: 0.4 ms
Lead Channel Pacing Threshold Pulse Width: 0.4 ms
Lead Channel Setting Pacing Amplitude: 2 V
Lead Channel Setting Pacing Amplitude: 3 V
Lead Channel Setting Pacing Pulse Width: 0.4 ms
Lead Channel Setting Sensing Sensitivity: 2 mV

## 2020-09-09 NOTE — Progress Notes (Signed)
Patient presents to the office for a nurse visit to have labs done. While checking vitals, patient states that she has been SOB, chest pressure for 2-3 weeks and BP has been irregular. Today's reading was 142/62. Readings from home range from 105/56 to 172/85 . EKG performed and results given to the provider to view. Per Dr. Oneta Rack, no changes from previous EKG.

## 2020-09-10 ENCOUNTER — Ambulatory Visit (INDEPENDENT_AMBULATORY_CARE_PROVIDER_SITE_OTHER): Payer: Medicare Other | Admitting: *Deleted

## 2020-09-10 DIAGNOSIS — I480 Paroxysmal atrial fibrillation: Secondary | ICD-10-CM | POA: Diagnosis not present

## 2020-09-10 DIAGNOSIS — E875 Hyperkalemia: Secondary | ICD-10-CM | POA: Diagnosis not present

## 2020-09-10 DIAGNOSIS — G5791 Unspecified mononeuropathy of right lower limb: Secondary | ICD-10-CM | POA: Diagnosis not present

## 2020-09-10 DIAGNOSIS — I495 Sick sinus syndrome: Secondary | ICD-10-CM | POA: Diagnosis not present

## 2020-09-10 DIAGNOSIS — J301 Allergic rhinitis due to pollen: Secondary | ICD-10-CM | POA: Diagnosis not present

## 2020-09-10 DIAGNOSIS — N183 Chronic kidney disease, stage 3 unspecified: Secondary | ICD-10-CM | POA: Diagnosis not present

## 2020-09-10 DIAGNOSIS — I081 Rheumatic disorders of both mitral and tricuspid valves: Secondary | ICD-10-CM | POA: Diagnosis not present

## 2020-09-10 DIAGNOSIS — I130 Hypertensive heart and chronic kidney disease with heart failure and stage 1 through stage 4 chronic kidney disease, or unspecified chronic kidney disease: Secondary | ICD-10-CM | POA: Diagnosis not present

## 2020-09-10 DIAGNOSIS — I2510 Atherosclerotic heart disease of native coronary artery without angina pectoris: Secondary | ICD-10-CM | POA: Diagnosis not present

## 2020-09-10 DIAGNOSIS — I4891 Unspecified atrial fibrillation: Secondary | ICD-10-CM | POA: Diagnosis not present

## 2020-09-10 DIAGNOSIS — Z5181 Encounter for therapeutic drug level monitoring: Secondary | ICD-10-CM | POA: Diagnosis not present

## 2020-09-10 DIAGNOSIS — Z85038 Personal history of other malignant neoplasm of large intestine: Secondary | ICD-10-CM | POA: Diagnosis not present

## 2020-09-10 DIAGNOSIS — Z85028 Personal history of other malignant neoplasm of stomach: Secondary | ICD-10-CM | POA: Diagnosis not present

## 2020-09-10 DIAGNOSIS — E039 Hypothyroidism, unspecified: Secondary | ICD-10-CM | POA: Diagnosis not present

## 2020-09-10 DIAGNOSIS — G2581 Restless legs syndrome: Secondary | ICD-10-CM | POA: Diagnosis not present

## 2020-09-10 DIAGNOSIS — Z7901 Long term (current) use of anticoagulants: Secondary | ICD-10-CM

## 2020-09-10 DIAGNOSIS — I272 Pulmonary hypertension, unspecified: Secondary | ICD-10-CM | POA: Diagnosis not present

## 2020-09-10 DIAGNOSIS — K3184 Gastroparesis: Secondary | ICD-10-CM | POA: Diagnosis not present

## 2020-09-10 DIAGNOSIS — I5033 Acute on chronic diastolic (congestive) heart failure: Secondary | ICD-10-CM | POA: Diagnosis not present

## 2020-09-10 DIAGNOSIS — H6523 Chronic serous otitis media, bilateral: Secondary | ICD-10-CM | POA: Diagnosis not present

## 2020-09-10 DIAGNOSIS — K219 Gastro-esophageal reflux disease without esophagitis: Secondary | ICD-10-CM | POA: Diagnosis not present

## 2020-09-10 DIAGNOSIS — E559 Vitamin D deficiency, unspecified: Secondary | ICD-10-CM | POA: Diagnosis not present

## 2020-09-10 DIAGNOSIS — Z950 Presence of cardiac pacemaker: Secondary | ICD-10-CM | POA: Diagnosis not present

## 2020-09-10 DIAGNOSIS — H5462 Unqualified visual loss, left eye, normal vision right eye: Secondary | ICD-10-CM | POA: Diagnosis not present

## 2020-09-10 DIAGNOSIS — Z952 Presence of prosthetic heart valve: Secondary | ICD-10-CM | POA: Diagnosis not present

## 2020-09-10 LAB — POCT INR: INR: 2.3 (ref 2.0–3.0)

## 2020-09-10 NOTE — Addendum Note (Signed)
Addended by: Lucky Cowboy on: 09/10/2020 08:40 PM   Modules accepted: Orders

## 2020-09-12 ENCOUNTER — Ambulatory Visit: Payer: Medicare Other | Admitting: Cardiology

## 2020-09-12 DIAGNOSIS — L308 Other specified dermatitis: Secondary | ICD-10-CM | POA: Diagnosis not present

## 2020-09-12 DIAGNOSIS — D485 Neoplasm of uncertain behavior of skin: Secondary | ICD-10-CM | POA: Diagnosis not present

## 2020-09-12 DIAGNOSIS — L209 Atopic dermatitis, unspecified: Secondary | ICD-10-CM | POA: Diagnosis not present

## 2020-09-16 DIAGNOSIS — I130 Hypertensive heart and chronic kidney disease with heart failure and stage 1 through stage 4 chronic kidney disease, or unspecified chronic kidney disease: Secondary | ICD-10-CM | POA: Diagnosis not present

## 2020-09-16 DIAGNOSIS — K219 Gastro-esophageal reflux disease without esophagitis: Secondary | ICD-10-CM | POA: Diagnosis not present

## 2020-09-16 DIAGNOSIS — K3184 Gastroparesis: Secondary | ICD-10-CM | POA: Diagnosis not present

## 2020-09-16 DIAGNOSIS — H5462 Unqualified visual loss, left eye, normal vision right eye: Secondary | ICD-10-CM | POA: Diagnosis not present

## 2020-09-16 DIAGNOSIS — Z952 Presence of prosthetic heart valve: Secondary | ICD-10-CM | POA: Diagnosis not present

## 2020-09-16 DIAGNOSIS — I2510 Atherosclerotic heart disease of native coronary artery without angina pectoris: Secondary | ICD-10-CM | POA: Diagnosis not present

## 2020-09-16 DIAGNOSIS — G2581 Restless legs syndrome: Secondary | ICD-10-CM | POA: Diagnosis not present

## 2020-09-16 DIAGNOSIS — E559 Vitamin D deficiency, unspecified: Secondary | ICD-10-CM | POA: Diagnosis not present

## 2020-09-16 DIAGNOSIS — J301 Allergic rhinitis due to pollen: Secondary | ICD-10-CM | POA: Diagnosis not present

## 2020-09-16 DIAGNOSIS — Z85028 Personal history of other malignant neoplasm of stomach: Secondary | ICD-10-CM | POA: Diagnosis not present

## 2020-09-16 DIAGNOSIS — Z85038 Personal history of other malignant neoplasm of large intestine: Secondary | ICD-10-CM | POA: Diagnosis not present

## 2020-09-16 DIAGNOSIS — E039 Hypothyroidism, unspecified: Secondary | ICD-10-CM | POA: Diagnosis not present

## 2020-09-16 DIAGNOSIS — E875 Hyperkalemia: Secondary | ICD-10-CM | POA: Diagnosis not present

## 2020-09-16 DIAGNOSIS — Z5181 Encounter for therapeutic drug level monitoring: Secondary | ICD-10-CM | POA: Diagnosis not present

## 2020-09-16 DIAGNOSIS — I480 Paroxysmal atrial fibrillation: Secondary | ICD-10-CM | POA: Diagnosis not present

## 2020-09-16 DIAGNOSIS — N183 Chronic kidney disease, stage 3 unspecified: Secondary | ICD-10-CM | POA: Diagnosis not present

## 2020-09-16 DIAGNOSIS — H6523 Chronic serous otitis media, bilateral: Secondary | ICD-10-CM | POA: Diagnosis not present

## 2020-09-16 DIAGNOSIS — Z950 Presence of cardiac pacemaker: Secondary | ICD-10-CM | POA: Diagnosis not present

## 2020-09-16 DIAGNOSIS — I081 Rheumatic disorders of both mitral and tricuspid valves: Secondary | ICD-10-CM | POA: Diagnosis not present

## 2020-09-16 DIAGNOSIS — I495 Sick sinus syndrome: Secondary | ICD-10-CM | POA: Diagnosis not present

## 2020-09-16 DIAGNOSIS — G5791 Unspecified mononeuropathy of right lower limb: Secondary | ICD-10-CM | POA: Diagnosis not present

## 2020-09-16 DIAGNOSIS — I272 Pulmonary hypertension, unspecified: Secondary | ICD-10-CM | POA: Diagnosis not present

## 2020-09-16 DIAGNOSIS — I5033 Acute on chronic diastolic (congestive) heart failure: Secondary | ICD-10-CM | POA: Diagnosis not present

## 2020-09-16 DIAGNOSIS — Z7901 Long term (current) use of anticoagulants: Secondary | ICD-10-CM | POA: Diagnosis not present

## 2020-09-22 ENCOUNTER — Telehealth: Payer: Self-pay | Admitting: Cardiology

## 2020-09-22 NOTE — Telephone Encounter (Signed)
    Pt c/o Shortness Of Breath: STAT if SOB developed within the last 24 hours or pt is noticeably SOB on the phone  1. Are you currently SOB (can you hear that pt is SOB on the phone)?   2. How long have you been experiencing SOB? Since saturday  3. Are you SOB when sitting or when up moving around? Moving arond  4. Are you currently experiencing any other symptoms?  [name] said, over the weekend p been on and off in afib, gained weight and right foot swollen, today everything improved but pt is sill SOB. She wanted to speak with a nurse, she wanted to know what else she can do to help pt.

## 2020-09-22 NOTE — Telephone Encounter (Addendum)
Calling patient to assist with sending a manual transmission.   Presenting AF/VP 78 (controlled VR). Will forward to Dr. Rayann Heman and covering RN.

## 2020-09-22 NOTE — Progress Notes (Signed)
Remote pacemaker transmission.   

## 2020-09-22 NOTE — Telephone Encounter (Signed)
Gave recommendations from Dr. [name].   Verbalized understanding.

## 2020-09-22 NOTE — Telephone Encounter (Signed)
I would give extra lasix again today. Sounds like she had a good response to this.

## 2020-09-22 NOTE — Telephone Encounter (Signed)
Patient took nitro SL Saturday related to shortness of breath and that helped. When she woke up Sunday with extra swelling, weight was up 3 pounds over night and up a 1-2 pounds the night before. Her diet has not changed. Followed Dr. [name] office previous advisement to take extra an lasix and potassium when this happens. She took this a voided a lot yesterday. Last night her daughter took off her stockings and noticed her right foot was very red and had swelling from the calf down "could make an indention". Elevated her legs over night, right swelling and redness is better. Foot is warm to the touch and notes that it is normally cold because all the PVD with surgeries.   Heart rate ~62-65 (pulse ox), BP 135/76 Heart rate 85 (BP machine), oxygen 92. When the patient gets up and moves around she continues to get short of breath and her daughter hears her wheezing. Patient has dropped down to 96.2 pounds from the previous 99.8 pounds yesterday.   ED precautions given. Verbalized understanding.    Will forward to Dr. Doug Sou office for advisement with lasix, daughter is asking if she should give anymore extra.   Will forward to device clinic for transmission to assess if afib medications need to be adjusted. (rate is okay now)

## 2020-09-24 ENCOUNTER — Ambulatory Visit (INDEPENDENT_AMBULATORY_CARE_PROVIDER_SITE_OTHER): Payer: Medicare Other | Admitting: Cardiology

## 2020-09-24 DIAGNOSIS — I5033 Acute on chronic diastolic (congestive) heart failure: Secondary | ICD-10-CM | POA: Diagnosis not present

## 2020-09-24 DIAGNOSIS — N183 Chronic kidney disease, stage 3 unspecified: Secondary | ICD-10-CM | POA: Diagnosis not present

## 2020-09-24 DIAGNOSIS — I272 Pulmonary hypertension, unspecified: Secondary | ICD-10-CM | POA: Diagnosis not present

## 2020-09-24 DIAGNOSIS — H5462 Unqualified visual loss, left eye, normal vision right eye: Secondary | ICD-10-CM | POA: Diagnosis not present

## 2020-09-24 DIAGNOSIS — E559 Vitamin D deficiency, unspecified: Secondary | ICD-10-CM | POA: Diagnosis not present

## 2020-09-24 DIAGNOSIS — J301 Allergic rhinitis due to pollen: Secondary | ICD-10-CM | POA: Diagnosis not present

## 2020-09-24 DIAGNOSIS — K3184 Gastroparesis: Secondary | ICD-10-CM | POA: Diagnosis not present

## 2020-09-24 DIAGNOSIS — Z85038 Personal history of other malignant neoplasm of large intestine: Secondary | ICD-10-CM | POA: Diagnosis not present

## 2020-09-24 DIAGNOSIS — K219 Gastro-esophageal reflux disease without esophagitis: Secondary | ICD-10-CM | POA: Diagnosis not present

## 2020-09-24 DIAGNOSIS — G5791 Unspecified mononeuropathy of right lower limb: Secondary | ICD-10-CM | POA: Diagnosis not present

## 2020-09-24 DIAGNOSIS — Z952 Presence of prosthetic heart valve: Secondary | ICD-10-CM | POA: Diagnosis not present

## 2020-09-24 DIAGNOSIS — I480 Paroxysmal atrial fibrillation: Secondary | ICD-10-CM | POA: Diagnosis not present

## 2020-09-24 DIAGNOSIS — Z85028 Personal history of other malignant neoplasm of stomach: Secondary | ICD-10-CM | POA: Diagnosis not present

## 2020-09-24 DIAGNOSIS — Z5181 Encounter for therapeutic drug level monitoring: Secondary | ICD-10-CM

## 2020-09-24 DIAGNOSIS — I2510 Atherosclerotic heart disease of native coronary artery without angina pectoris: Secondary | ICD-10-CM | POA: Diagnosis not present

## 2020-09-24 DIAGNOSIS — E875 Hyperkalemia: Secondary | ICD-10-CM | POA: Diagnosis not present

## 2020-09-24 DIAGNOSIS — I130 Hypertensive heart and chronic kidney disease with heart failure and stage 1 through stage 4 chronic kidney disease, or unspecified chronic kidney disease: Secondary | ICD-10-CM | POA: Diagnosis not present

## 2020-09-24 DIAGNOSIS — Z7901 Long term (current) use of anticoagulants: Secondary | ICD-10-CM | POA: Diagnosis not present

## 2020-09-24 DIAGNOSIS — H6523 Chronic serous otitis media, bilateral: Secondary | ICD-10-CM | POA: Diagnosis not present

## 2020-09-24 DIAGNOSIS — G2581 Restless legs syndrome: Secondary | ICD-10-CM | POA: Diagnosis not present

## 2020-09-24 DIAGNOSIS — I081 Rheumatic disorders of both mitral and tricuspid valves: Secondary | ICD-10-CM | POA: Diagnosis not present

## 2020-09-24 DIAGNOSIS — Z950 Presence of cardiac pacemaker: Secondary | ICD-10-CM | POA: Diagnosis not present

## 2020-09-24 DIAGNOSIS — E039 Hypothyroidism, unspecified: Secondary | ICD-10-CM | POA: Diagnosis not present

## 2020-09-24 DIAGNOSIS — I495 Sick sinus syndrome: Secondary | ICD-10-CM | POA: Diagnosis not present

## 2020-09-24 LAB — POCT INR: INR: 3.2 — AB (ref 2.0–3.0)

## 2020-09-24 NOTE — Patient Instructions (Signed)
Description   Spoke with Lawerance Bach from [facility] RN advised for pt to hold warfarin today and then continue taking 1 tablet daily except for 1/2 tablet on Sundays.  Recheck INR in 2 weeks. [facility]#: Tabitha's [phone], [place]'s # [phone].

## 2020-09-26 DIAGNOSIS — L931 Subacute cutaneous lupus erythematosus: Secondary | ICD-10-CM | POA: Diagnosis not present

## 2020-09-26 DIAGNOSIS — R531 Weakness: Secondary | ICD-10-CM | POA: Diagnosis not present

## 2020-10-01 DIAGNOSIS — I272 Pulmonary hypertension, unspecified: Secondary | ICD-10-CM | POA: Diagnosis not present

## 2020-10-01 DIAGNOSIS — I081 Rheumatic disorders of both mitral and tricuspid valves: Secondary | ICD-10-CM | POA: Diagnosis not present

## 2020-10-01 DIAGNOSIS — H5462 Unqualified visual loss, left eye, normal vision right eye: Secondary | ICD-10-CM | POA: Diagnosis not present

## 2020-10-01 DIAGNOSIS — K3184 Gastroparesis: Secondary | ICD-10-CM | POA: Diagnosis not present

## 2020-10-01 DIAGNOSIS — G2581 Restless legs syndrome: Secondary | ICD-10-CM | POA: Diagnosis not present

## 2020-10-01 DIAGNOSIS — Z85028 Personal history of other malignant neoplasm of stomach: Secondary | ICD-10-CM | POA: Diagnosis not present

## 2020-10-01 DIAGNOSIS — Z950 Presence of cardiac pacemaker: Secondary | ICD-10-CM | POA: Diagnosis not present

## 2020-10-01 DIAGNOSIS — E875 Hyperkalemia: Secondary | ICD-10-CM | POA: Diagnosis not present

## 2020-10-01 DIAGNOSIS — G5791 Unspecified mononeuropathy of right lower limb: Secondary | ICD-10-CM | POA: Diagnosis not present

## 2020-10-01 DIAGNOSIS — N183 Chronic kidney disease, stage 3 unspecified: Secondary | ICD-10-CM | POA: Diagnosis not present

## 2020-10-01 DIAGNOSIS — I495 Sick sinus syndrome: Secondary | ICD-10-CM | POA: Diagnosis not present

## 2020-10-01 DIAGNOSIS — Z5181 Encounter for therapeutic drug level monitoring: Secondary | ICD-10-CM | POA: Diagnosis not present

## 2020-10-01 DIAGNOSIS — I480 Paroxysmal atrial fibrillation: Secondary | ICD-10-CM | POA: Diagnosis not present

## 2020-10-01 DIAGNOSIS — Z952 Presence of prosthetic heart valve: Secondary | ICD-10-CM | POA: Diagnosis not present

## 2020-10-01 DIAGNOSIS — J301 Allergic rhinitis due to pollen: Secondary | ICD-10-CM | POA: Diagnosis not present

## 2020-10-01 DIAGNOSIS — E559 Vitamin D deficiency, unspecified: Secondary | ICD-10-CM | POA: Diagnosis not present

## 2020-10-01 DIAGNOSIS — K219 Gastro-esophageal reflux disease without esophagitis: Secondary | ICD-10-CM | POA: Diagnosis not present

## 2020-10-01 DIAGNOSIS — I130 Hypertensive heart and chronic kidney disease with heart failure and stage 1 through stage 4 chronic kidney disease, or unspecified chronic kidney disease: Secondary | ICD-10-CM | POA: Diagnosis not present

## 2020-10-01 DIAGNOSIS — Z85038 Personal history of other malignant neoplasm of large intestine: Secondary | ICD-10-CM | POA: Diagnosis not present

## 2020-10-01 DIAGNOSIS — H6523 Chronic serous otitis media, bilateral: Secondary | ICD-10-CM | POA: Diagnosis not present

## 2020-10-01 DIAGNOSIS — I2510 Atherosclerotic heart disease of native coronary artery without angina pectoris: Secondary | ICD-10-CM | POA: Diagnosis not present

## 2020-10-01 DIAGNOSIS — Z7901 Long term (current) use of anticoagulants: Secondary | ICD-10-CM | POA: Diagnosis not present

## 2020-10-01 DIAGNOSIS — I5033 Acute on chronic diastolic (congestive) heart failure: Secondary | ICD-10-CM | POA: Diagnosis not present

## 2020-10-01 DIAGNOSIS — E039 Hypothyroidism, unspecified: Secondary | ICD-10-CM | POA: Diagnosis not present

## 2020-10-02 DIAGNOSIS — R531 Weakness: Secondary | ICD-10-CM | POA: Diagnosis not present

## 2020-10-02 DIAGNOSIS — L931 Subacute cutaneous lupus erythematosus: Secondary | ICD-10-CM | POA: Diagnosis not present

## 2020-10-03 ENCOUNTER — Telehealth: Payer: Self-pay | Admitting: Cardiology

## 2020-10-03 NOTE — Telephone Encounter (Signed)
Spoke to patient's daughter Beverlee Nims.She stated mother has been in Afib off and on for the past 2 weeks.Stated her B/P has been up and down.B/P ranging 176/101,148/79,158/90,155/95.Pulse [phone].Stated she complains of being tired.She is taking all medications as prescribed.She normally takes Metoprolol 50 mg 1&1/2 at 3:00 pm.Advised she can go ahead and take now.Stated she is not sure if her mother takes 1&1/2 tablets.She will check and make sure.Appointment scheduled with Adline Peals PA at [facility] 1/31 at 11:00 am.Advised I will make Dr.Jordan aware.

## 2020-10-03 NOTE — Telephone Encounter (Signed)
STAT if HR is under 50 or over 120 (normal HR is 60-100 beats per minute)  1) What is your heart rate? 111 and 3 missed beats, 112 with 2 missed beats 106 early this morning- her blood pressure this morning 176/101, later 155/95  2) Do you have a log of your heart rate readings (document readings)?   3) Do you have any other symptoms? Really tired, short of breath when she moves around

## 2020-10-06 ENCOUNTER — Other Ambulatory Visit: Payer: Self-pay

## 2020-10-06 ENCOUNTER — Ambulatory Visit (HOSPITAL_COMMUNITY)
Admission: RE | Admit: 2020-10-06 | Discharge: 2020-10-06 | Disposition: A | Discharge status: Home / Self Care | Payer: Medicare Other | Source: Ambulatory Visit | Attending: Nurse Practitioner | Admitting: Nurse Practitioner

## 2020-10-06 ENCOUNTER — Encounter (HOSPITAL_COMMUNITY): Payer: Self-pay | Admitting: Nurse Practitioner

## 2020-10-06 VITALS — BP 208/58 | HR 97 | Ht 59.0 in | Wt 96.8 lb

## 2020-10-06 DIAGNOSIS — N183 Chronic kidney disease, stage 3 unspecified: Secondary | ICD-10-CM | POA: Insufficient documentation

## 2020-10-06 DIAGNOSIS — I10 Essential (primary) hypertension: Secondary | ICD-10-CM | POA: Diagnosis not present

## 2020-10-06 DIAGNOSIS — Z888 Allergy status to other drugs, medicaments and biological substances status: Secondary | ICD-10-CM | POA: Diagnosis not present

## 2020-10-06 DIAGNOSIS — Z950 Presence of cardiac pacemaker: Secondary | ICD-10-CM | POA: Diagnosis not present

## 2020-10-06 DIAGNOSIS — I495 Sick sinus syndrome: Secondary | ICD-10-CM | POA: Diagnosis not present

## 2020-10-06 DIAGNOSIS — Z79899 Other long term (current) drug therapy: Secondary | ICD-10-CM | POA: Diagnosis not present

## 2020-10-06 DIAGNOSIS — I4891 Unspecified atrial fibrillation: Secondary | ICD-10-CM | POA: Diagnosis not present

## 2020-10-06 DIAGNOSIS — I130 Hypertensive heart and chronic kidney disease with heart failure and stage 1 through stage 4 chronic kidney disease, or unspecified chronic kidney disease: Secondary | ICD-10-CM | POA: Insufficient documentation

## 2020-10-06 DIAGNOSIS — D6869 Other thrombophilia: Secondary | ICD-10-CM | POA: Diagnosis not present

## 2020-10-06 DIAGNOSIS — I4819 Other persistent atrial fibrillation: Secondary | ICD-10-CM | POA: Insufficient documentation

## 2020-10-06 DIAGNOSIS — I5032 Chronic diastolic (congestive) heart failure: Secondary | ICD-10-CM | POA: Diagnosis not present

## 2020-10-06 DIAGNOSIS — E785 Hyperlipidemia, unspecified: Secondary | ICD-10-CM | POA: Diagnosis not present

## 2020-10-06 DIAGNOSIS — Z885 Allergy status to narcotic agent status: Secondary | ICD-10-CM | POA: Insufficient documentation

## 2020-10-06 DIAGNOSIS — Z905 Acquired absence of kidney: Secondary | ICD-10-CM | POA: Insufficient documentation

## 2020-10-06 DIAGNOSIS — Z8249 Family history of ischemic heart disease and other diseases of the circulatory system: Secondary | ICD-10-CM | POA: Insufficient documentation

## 2020-10-06 DIAGNOSIS — Z7901 Long term (current) use of anticoagulants: Secondary | ICD-10-CM | POA: Insufficient documentation

## 2020-10-06 MED ORDER — METOPROLOL SUCCINATE ER 50 MG PO TB24: 50.0000 mg | ORAL_TABLET | Freq: Two times a day (BID) | ORAL | 3 refills | Status: DC

## 2020-10-06 NOTE — Patient Instructions (Signed)
Increase metoprolol to 50mg twice a day 

## 2020-10-06 NOTE — Progress Notes (Signed)
Primary Care Physician: Lucky Cowboy, MD Primary Cardiologist: Dr Swaziland Primary Electrophysiologist: Dr Johney Frame Referring Physician: Dr Levy Pupa is a 87 y.o. 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 Patient is on warfarin for a CHADS2VASC score of 7. She had TAVR 10/2019, post operative course complicated by AF and diastolic CHF. She underwent DCCV on 12/05/19. The device clinic received an alert for an ongoing afib episode starting 06/04/20 with rapid rates. She had noted symptoms of increased fatigue, dyspnea with exertion, and palpitations.  On follow up today, patient is s/p DCCV 06/13/20. She was feeling well on follow up with Dr Swaziland 06/16/20. Unfortunately, patient felt she was back in afib 07/15/20 with symptoms of heart racing, SOB, and fatigue. ECG today shows afib HR 104. She has had rates up to 120 at home. There were no specific triggers that she could identify.   F/u in afib clinic, 1/31/222. Pt felt the need to take extra lasix the end of last week( which has occurred in the past) for shortness of breath.  She was advised to do this by Dr. Elvis Coil office  but was asked to f/u in the afib clinic today. The extra lasix worked and she is  back to her usual baseline. But  on presentation,  her BP was very elevated at 208/85, repeated with me it was 180/80. Reviewing her BP's at home she often has elevated BP's int her 160-180 systolic range. She denies a heavy salt intake over the last few days. She is in rate controlled afib and her numbers form home reflect this. When she saw Dr. Johney Frame in December, he did not feel there was a good option for her to return to or maintain  SR, so he  recommended rate control as her treatment plan.   Today, she denies symptoms of chest pain, orthopnea, PND, lower extremity edema, dizziness, presyncope, syncope, snoring, daytime somnolence, bleeding, or neurologic sequela. The patient is tolerating medications without difficulties and is otherwise without complaint today.    Atrial Fibrillation Risk Factors:  she does not have symptoms or diagnosis of sleep apnea.   she has a BMI of Body mass index is 19.55 kg/m.[name] Filed Weights   10/06/20 1052  Weight: 43.9 kg    Family History  Problem Relation Age of Onset  . Colon cancer Mother 82  . Heart disease Mother   . Heart disease Father   . Heart attack Father   . Heart disease Maternal Grandmother   . Stomach cancer Brother   . Esophageal cancer Neg Hx   . Rectal cancer Neg Hx      Atrial Fibrillation Management history:  Previous antiarrhythmic drugs: amiodarone Previous cardioversions: 12/05/19, 06/13/20 Previous ablations: none CHADS2VASC score: 7 Anticoagulation history: warfarin    Past Medical History:  Diagnosis Date  . Adenocarcinoma of stomach (HCC) 11/11/2015   gastric mass on egd,   . Anal fissure   . Anemia    hx 3/17 iron infusion, on aranesp and injected B12 since 11/2015.   [name] Anxiety   . Aortic root dilatation (HCC)   . Arthritis    oa  . Bladder cancer (HCC)   . Blind left eye 2015   partial blind  in left eye due to stroke   . 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 CAD (coronary artery disease)   . Cataracts, bilateral   . Chronic kidney disease    only has one kidney rt lft rem 03 ca     dr. Risa Grill, bladder cancer 05-04-2018  . Elevated LFTs    hepatic steatosis, none recent  . Endometrial cancer ([place])   . Gastroparesis   . GERD (gastroesophageal reflux disease)   . Hearing loss    wears hearing aids  . Heart murmur   . History of uterine cancer 1980s   treated with hysterectomy, external radiation and radiation seed implants.   [name] HTN (hypertension)    . Hyperlipemia   . Hypothyroidism   . Iron deficiency anemia due to chronic blood loss 11/24/2015  . Neuropathy    right leg  . Osteomyelitis ([place]) as child  . PAF (paroxysmal atrial fibrillation) ([place])   . Presence of permanent cardiac pacemaker   . Refusal of blood transfusions as patient is Jehovah's Witness   . Restless leg syndrome   . 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 Severe aortic stenosis   . Stroke [facility]) 2015   partial stroke left legally blind; left eye   . Tachycardia-bradycardia syndrome ([place])    s/p PPM by Dr Doreatha Lew (MDT) 07/03/10  . 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 Walker as ambulation aid   . Wears glasses   . Wears partial dentures    upper   Past Surgical History:  Procedure Laterality Date  . CARDIAC CATHETERIZATION  2008  . CARDIOVERSION N/A 12/05/2019   Procedure: CARDIOVERSION;  Surgeon: Elouise Munroe, MD;  Location: [facility] ENDOSCOPY;  Service: Cardiovascular;  Laterality: N/A;  . CARDIOVERSION N/A 06/13/2020   Procedure: CARDIOVERSION;  Surgeon: Buford Dresser, MD;  Location: [facility] ENDOSCOPY;  Service: Cardiovascular;  Laterality: N/A;  . CHOLECYSTECTOMY  1970s  . COLONOSCOPY N/A 11/11/2015   Procedure: COLONOSCOPY;  Surgeon: [name], MD;  Location: [facility] ENDOSCOPY;  Service: Endoscopy;  Laterality: N/A;  . CYSTOSCOPY W/ RETROGRADES Right 05/01/2018   Procedure: CYSTOSCOPY WITH RIGHT RETROGRADE PYELOGRAM;  Surgeon: Lucas Mallow, MD;  Location: [facility] ORS;  Service: Urology;  Laterality: Right;  . CYSTOSCOPY W/ RETROGRADES Right 07/21/2018   Procedure: CYSTOSCOPY WITH RIGHT RETROGRADE PYELOGRAM;  Surgeon: Alexis Frock, MD;  [facility]: [facility] ORS;  Service: Urology;  Laterality: Right;  . ESOPHAGOGASTRODUODENOSCOPY N/A 11/11/2015   Procedure: ESOPHAGOGASTRODUODENOSCOPY (EGD);  Surgeon: [name], MD;   Location: [facility] END[facility];  Service: Endoscopy;  Laterality: N/A;  . ESOPHAGOGASTRODUODENOSCOPY N/A 02/05/2016   Procedure: ESOPHAGOGASTRODUODENOSCOPY (EGD);  Surgeon: Ladene Artist, MD;  Location: [facility] ENDOSCOPY;  Service: Endoscopy;  Laterality: N/A;  . ESOPHAGOGASTRODUODENOSCOPY N/A 04/02/2016   Procedure: ESOPHAGOGASTRODUODENOSCOPY (EGD);  Surgeon: [name], MD;  Location: [facility] ENDOSCOPY;  Service: Endoscopy;  Laterality: N/A;  . ESOPHAGOGASTRODUODENOSCOPY (EGD) WITH PROPOFOL N/A 04/06/2017   Procedure: ESOPHAGOGASTRODUODENOSCOPY (EGD) WITH PROPOFOL;  Surgeon: [name], MD;  [facility];  Service: Endoscopy;  Laterality: N/A;  . EYE SURGERY Bilateral    remove cataracts  . GASTRECTOMY N/A 01/15/2016   Procedure: OPEN PARTIAL GASTRECTOMY;  Surgeon: Stark Klein, MD;  Location: [place];  Service: General;  Laterality: N/A;  . GASTRECTOMY Left 02/24/2016   Procedure: OPEN J TUBE;  Surgeon: Stark Klein, MD;  Location: [facility]  OR;  Service: General;  Laterality: Left;  [name] GASTROJEJUNOSTOMY N/A 05/18/2016   Procedure: ROUX EN [place];  Surgeon: Stark Klein, MD;  Location: [place];  Service: General;  Laterality: N/A;  . I & D KNEE WITH POLY EXCHANGE Right 12/17/2013   Procedure: IRRIGATION AND DEBRIDEMEN RIGHT TOTAL  KNEE WITH POLY EXCHANGE;  Surgeon: Mauri Pole, MD;  [facility]S;  Service: Orthopedics;  Laterality: Right;  . IR GENERIC HISTORICAL  06/17/2016   IR RADIOLOGIST EVAL & MGMT 06/17/2016 Ascencion Dike, PA-C [facility] INTERV RAD  . LAPAROSCOPY N/A 01/15/2016   Procedure: LAPAROSCOPY DIAGNOSTIC;  Surgeon: Stark Klein, MD;  Location: [place];  Service: General;  Laterality: N/A;  . LYSIS OF ADHESION  05/18/2016   Procedure: LYSIS OF ADHESION;  Surgeon: Stark Klein, MD;  Location: [place];  Service: General;;  . NEPHRECTOMY Left    renal cell cancer  . PACEMAKER INSERTION  07/04/2011   MDT PPM implanted by Dr Doreatha Lew for tachy/brady; managed by Dr Thompson Grayer   .  PATELLAR TENDON REPAIR Right 03/06/2014   Procedure: AVULSION WITH PATELLA TENDON REPAIR;  Surgeon: Mauri Pole, MD;  [facility];  Service: Orthopedics;  Laterality: Right;  . PATENT DUCTUS ARTERIOUS REPAIR  ~ 1949  . RIGHT/LEFT HEART CATH AND CORONARY ANGIOGRAPHY N/A 10/09/2019   Procedure: RIGHT/LEFT HEART CATH AND CORONARY ANGIOGRAPHY;  Surgeon: [name], [name], MD;  Location: [place] CV LAB;  Service: Cardiovascular;  Laterality: N/A;  . TEE WITHOUT CARDIOVERSION N/A 10/30/2019   Procedure: TRANSESOPHAGEAL ECHOCARDIOGRAM (TEE);  Surgeon: Burnell Blanks, MD;  Location: [place];  Service: Open Heart Surgery;  Laterality: N/A;  . TEE WITHOUT CARDIOVERSION N/A 06/13/2020   Procedure: TRANSESOPHAGEAL ECHOCARDIOGRAM (TEE);  Surgeon: Buford Dresser, MD;  Location: [facility] ENDOSCOPY;  Service: Cardiovascular;  Laterality: N/A;  . TOTAL ABDOMINAL HYSTERECTOMY  87  . TOTAL HIP REVISION Right 2009   initial replacment surg  . TOTAL KNEE ARTHROPLASTY  02/07/2012   Procedure: TOTAL KNEE ARTHROPLASTY;  Surgeon: Mauri Pole, MD;  [facility];  Service: Orthopedics;  Laterality: Right;  . TOTAL KNEE REVISION Right 07/29/2014   Procedure: RIGHT KNEE REVISION OF PREVIOUS REPAIR EXTENSOR MECHANISM;  Surgeon: Mauri Pole, MD;  [facility];  Service: Orthopedics;  Laterality: Right;  . TRANSCATHETER AORTIC VALVE REPLACEMENT, CAROTID Left 10/30/2019   TRANSCATHETER AORTIC VALVE REPLACEMENT, LEFT CAROTID (Left Neck)   . TRANSCATHETER AORTIC VALVE REPLACEMENT, CAROTID Left 10/30/2019   Procedure: TRANSCATHETER AORTIC VALVE REPLACEMENT, LEFT CAROTID;  Surgeon: Burnell Blanks, MD;  Location: [place];  Service: Open Heart Surgery;  Laterality: Left;  . TRANSURETHRAL RESECTION OF BLADDER TUMOR N/A 07/21/2018   Procedure: TRANSURETHRAL RESECTION OF BLADDER TUMOR (TURBT);  Surgeon: Alexis Frock, MD;  [facility];  Service: Urology;  Laterality: N/A;  . TRANSURETHRAL RESECTION  OF BLADDER TUMOR WITH MITOMYCIN-C N/A 05/01/2018   Procedure: TRANSURETHRAL RESECTION OF BLADDER TUMOR WITH POST OP INSTILLATION OF GEMCITABINE;  Surgeon: Lucas Mallow, MD;  [facility];  Service: Urology;  Laterality: N/A;  . WHIPPLE PROCEDURE N/A 05/18/2016   Procedure: EXPLORATORY LAPAROTOMY;  Surgeon: Stark Klein, MD;  Location: [place] OR;  Service: General;  Laterality: N/A;    Current Outpatient Medications  Medication Sig Dispense Refill  . acetaminophen (TYLENOL) 500 MG tablet Take 500 mg by mouth at bedtime.    . ALPRAZolam (XANAX) 0.25 MG tablet Take 0.5-1 tablets (0.125-0.25 mg total) by mouth 2 (two) times daily  as needed for anxiety. 30 tablet 0  . Alum & Mag Hydroxide-Simeth (MYLANTA PO) Take 20 mLs by mouth every evening.     [name] amoxicillin (AMOXIL) 500 MG tablet Take 4 tablets (2,000 mg total) by mouth as directed. 1 hour prior to dental work including cleanings 12 tablet 12  . Ascorbic Acid (VITAMIN C) 1000 MG tablet Take 1,000 mg by mouth daily.    . Cholecalciferol (VITAMIN D-3) 5000 units TABS Take 5,000 Units by mouth 3 (three) times daily.     . Cyanocobalamin 5000 MCG SUBL Place 5,000 mcg under the tongue daily.     [name] erythromycin ophthalmic ointment Place 1 application into both eyes at bedtime as needed (stye).     . famotidine (PEPCID) 40 MG tablet TAKE 1 TABLET DAILY FOR INDIGESTION & HEARTBURN 90 tablet 3  . ferrous sulfate 325 (65 FE) MG tablet Take 325 mg by mouth daily in the afternoon.    . furosemide (LASIX) 20 MG tablet Take 1 tablet (20 mg total) by mouth daily. 90 tablet 3  . gabapentin (NEURONTIN) 300 MG capsule TAKE 1 CAPSULE BY MOUTH THREE TIMES A DAY 270 capsule 1  . hydrocortisone (ANUSOL-HC) 2.5 % rectal cream Place rectally 2 (two) times daily. 30 g 1  . levothyroxine (SYNTHROID) 100 MCG tablet TAKE 1/2 TABLET DAILY FOR THYROID. 45 tablet 1  . loperamide (IMODIUM A-D) 2 MG tablet Take up to 12 tablets /day as needed for  for Diarrhea 120 tablet 11   . loratadine (CLARITIN) 10 MG tablet Take 10 mg by mouth at bedtime.    [name] losartan (COZAAR) 50 MG tablet Take 1 tablet (50 mg total) by mouth daily. 90 tablet 3  . nitroGLYCERIN (NITROSTAT) 0.4 MG SL tablet Dissolve 1 tablet under tongue every 5 minutes if needed for Angina 50 tablet prn  . ondansetron (ZOFRAN-ODT) 8 MG disintegrating tablet Dissolve 1 tablet under tongue every 6 to 8 hours  3 x /day if needed for Nausea or Vomitting 30 tablet 3  . potassium chloride (KLOR-CON) 10 MEQ tablet Take 1 tablet (10 mEq total) by mouth daily. 90 tablet 3  . pravastatin (PRAVACHOL) 20 MG tablet Take 1 tablet (20 mg total) by mouth daily. 90 tablet 1  . Propylene Glycol (SYSTANE COMPLETE) 0.6 % SOLN Place 1 drop into both eyes 3 (three) times daily as needed (irritation/dry eyes.).    [name] simethicone (MYLICON) 80 MG chewable tablet Chew 80 mg by mouth every 6 (six) hours as needed for flatulence.    . sucralfate (CARAFATE) 1 g tablet Take 1-2 g by mouth See admin instructions. Take 2 tablets (2 g) by mouth in the morning & take 1 tablet (1g) by mouth at lunch    . tobramycin-dexamethasone (TOBRADEX) ophthalmic solution Place 1 drop into both eyes daily as needed (stye).     [name] triamcinolone cream (KENALOG) 0.1 % Apply 1 application topically 2 (two) times daily as needed (rash/skin irritation.).     [name] verapamil (CALAN-SR) 120 MG CR tablet TAKE 1 TABLET 2 X /DAY WITH A MEAL FOR BP & HEART RHYTHM 180 tablet 3  . warfarin (COUMADIN) 2 MG tablet Take      as directed       to Prevent Blood Clots (Patient taking differently: Take one tablet daily and 1/2 tablet on Sunday) 180 tablet 0  . metoprolol succinate (TOPROL-XL) 50 MG 24 hr tablet Take 1 tablet (50 mg total) by mouth in the morning and at bedtime.  90 tablet 3   No current facility-administered medications for this encounter.    Allergies  Allergen Reactions  . Amiodarone Other (See Comments)     Thyroid and liver and kidney problems  . Adhesive [Tape]  Other (See Comments)    Tears skin, Please use "paper" tape  . Hydrocodone Other (See Comments)    HALLLUCINATIONS  . Other Other (See Comments)    NO BLOOD PRODUCTS -  [place] Witness  . Reglan [Metoclopramide] Hives, Itching and Rash  . Remeron [Mirtazapine] Other (See Comments)    Hallucinations and make pt loopy  . Codeine Nausea And Vomiting  . Morphine And Related Nausea And Vomiting  . Requip [Ropinirole Hcl] Other (See Comments)    Headache   . Zinc Nausea Only    Social History   Socioeconomic History  . Marital status: Widowed    Spouse name: Not on file  . Number of children: 3  . Years of education: Not on file  . Highest education level: Not on file  Occupational History  . Not on file  Tobacco Use  . Smoking status: Never Smoker  . Smokeless tobacco: Never Used  Vaping Use  . Vaping Use: Never used  Substance and Sexual Activity  . Alcohol use: No  . Drug use: No  . Sexual activity: Never    Comment: Hysterectomy  Other Topics Concern  . Not on file  Social History Narrative   01/01/19 Lives in [place] w/dgtr, [name].  Continues to work.   Social Determinants of Health   Financial Resource Strain: Not on file  Food Insecurity: Not on file  Transportation Needs: Not on file  Physical Activity: Not on file  Stress: Not on file  Social Connections: Not on file  Intimate Partner Violence: Not on file     ROS- All systems are reviewed and negative except as per the HPI above.  Physical Exam: Vitals:   10/06/20 1052  BP: (!) 208/58  Pulse: 97  Weight: 43.9 kg  Height: 4\' 11"  (1.499 m)    GEN- The patient is well appearing elderly female, alert and oriented x 3 today.   HEENT-head normocephalic, atraumatic, sclera clear, conjunctiva pink, hearing intact, trachea midline. Lungs- Clear to ausculation bilaterally, normal work of breathing Heart- irregular rate and rhythm, no murmurs, rubs or gallops  GI- soft, NT, ND, + BS Extremities-  no clubbing, cyanosis, or edema MS- no significant deformity or atrophy Skin- no rash or lesion Psych- euthymic mood, full affect Neuro- strength and sensation are intact   Wt Readings from Last 3 Encounters:  10/06/20 43.9 kg  09/09/20 45 kg  08/18/20 43.5 kg    EKG today demonstrates afib HR 97 bpm,  QRS 78, QTc 485 ms  Echo 11/22/19 demonstrated  1. Left ventricular ejection fraction, by estimation, is 55 to 60%. The  left ventricle has normal function. The left ventricle has no regional  wall motion abnormalities. There is mild concentric left ventricular  hypertrophy. Left ventricular diastolic  parameters are indeterminate. Elevated left ventricular end-diastolic  pressure.  2. Right ventricular systolic function is normal. The right ventricular  size is normal. There is mildly elevated pulmonary artery systolic  pressure.  3. Left atrial size was severely dilated.  4. Right atrial size was severely dilated.  5. The mitral valve is normal in structure. Mild mitral valve  regurgitation. No evidence of mitral stenosis.  6. Mild to moderate perivalvular aortic regurgitation unchanged from  prior. The aortic  valve has been repaired/replaced. Aortic valve  regurgitation is mild to moderate. No aortic stenosis is present. There is  a 23 mm Edwards Sapien prosthetic (TAVR)  valve present in the aortic position. Procedure Date: 10/30/19. Aortic  regurgitation PHT measures 358 msec. Aortic valve mean gradient measures  10.0 mmHg. Aortic valve Vmax measures 2.11 m/s.  7. Aortic dilatation noted. There is mild dilatation of the ascending  aorta.  8. The inferior vena cava is normal in size with greater than 50%  respiratory variability, suggesting right atrial pressure of 3 mmHg.   Epic records are reviewed at length today  CHA2DS2-VASc Score = 7  The patient's score is based upon: CHF History: No HTN History: Yes Diabetes History: No Stroke History: Yes Vascular  Disease History: Yes      ASSESSMENT AND PLAN: 1. Persistent Atrial Fibrillation (ICD10:  I48.19) The patient's CHA2DS2-VASc score is 7, indicating a 11.2% annual risk of stroke.   Patient is in rate controlled afib in the 90's with elevation of BP so will try continuing BB  50 mg am and increasing the pm dose to 50 mg      Per Dr. Rayann Heman her management strategy will be rate control (please see note from 08/11/20)  Continue warfarin Continue verapamil 120 mg BID  2. Secondary Hypercoagulable State (ICD10:  D68.69) The patient is at significant risk for stroke/thromboembolism based upon her CHA2DS2-VASc Score of 7.  Continue Warfarin (Coumadin).   3. Tachybradycardia syndrome S/p PPM, followed by Dr Rayann Heman and the device clinic.  4. HTN Elevated today, better on recheck, she will recheck on retun to home Avoid salt, hopefully increase in BB will help  Recheck with Adline Peals later this week   Butch Penny C. Maximos Zayas, [facility] [address] [place], [place] [address] [phone]

## 2020-10-08 ENCOUNTER — Ambulatory Visit (INDEPENDENT_AMBULATORY_CARE_PROVIDER_SITE_OTHER): Payer: Medicare Other | Admitting: Interventional Cardiology

## 2020-10-08 DIAGNOSIS — J301 Allergic rhinitis due to pollen: Secondary | ICD-10-CM | POA: Diagnosis not present

## 2020-10-08 DIAGNOSIS — Z952 Presence of prosthetic heart valve: Secondary | ICD-10-CM | POA: Diagnosis not present

## 2020-10-08 DIAGNOSIS — I081 Rheumatic disorders of both mitral and tricuspid valves: Secondary | ICD-10-CM | POA: Diagnosis not present

## 2020-10-08 DIAGNOSIS — K3184 Gastroparesis: Secondary | ICD-10-CM | POA: Diagnosis not present

## 2020-10-08 DIAGNOSIS — Z5181 Encounter for therapeutic drug level monitoring: Secondary | ICD-10-CM | POA: Diagnosis not present

## 2020-10-08 DIAGNOSIS — Z7901 Long term (current) use of anticoagulants: Secondary | ICD-10-CM | POA: Diagnosis not present

## 2020-10-08 DIAGNOSIS — Z950 Presence of cardiac pacemaker: Secondary | ICD-10-CM | POA: Diagnosis not present

## 2020-10-08 DIAGNOSIS — G5791 Unspecified mononeuropathy of right lower limb: Secondary | ICD-10-CM | POA: Diagnosis not present

## 2020-10-08 DIAGNOSIS — E039 Hypothyroidism, unspecified: Secondary | ICD-10-CM | POA: Diagnosis not present

## 2020-10-08 DIAGNOSIS — K219 Gastro-esophageal reflux disease without esophagitis: Secondary | ICD-10-CM | POA: Diagnosis not present

## 2020-10-08 DIAGNOSIS — I130 Hypertensive heart and chronic kidney disease with heart failure and stage 1 through stage 4 chronic kidney disease, or unspecified chronic kidney disease: Secondary | ICD-10-CM | POA: Diagnosis not present

## 2020-10-08 DIAGNOSIS — Z85028 Personal history of other malignant neoplasm of stomach: Secondary | ICD-10-CM | POA: Diagnosis not present

## 2020-10-08 DIAGNOSIS — G2581 Restless legs syndrome: Secondary | ICD-10-CM | POA: Diagnosis not present

## 2020-10-08 DIAGNOSIS — I480 Paroxysmal atrial fibrillation: Secondary | ICD-10-CM | POA: Diagnosis not present

## 2020-10-08 DIAGNOSIS — Z85038 Personal history of other malignant neoplasm of large intestine: Secondary | ICD-10-CM | POA: Diagnosis not present

## 2020-10-08 DIAGNOSIS — H6523 Chronic serous otitis media, bilateral: Secondary | ICD-10-CM | POA: Diagnosis not present

## 2020-10-08 DIAGNOSIS — I495 Sick sinus syndrome: Secondary | ICD-10-CM | POA: Diagnosis not present

## 2020-10-08 DIAGNOSIS — E559 Vitamin D deficiency, unspecified: Secondary | ICD-10-CM | POA: Diagnosis not present

## 2020-10-08 DIAGNOSIS — I2510 Atherosclerotic heart disease of native coronary artery without angina pectoris: Secondary | ICD-10-CM | POA: Diagnosis not present

## 2020-10-08 DIAGNOSIS — I272 Pulmonary hypertension, unspecified: Secondary | ICD-10-CM | POA: Diagnosis not present

## 2020-10-08 DIAGNOSIS — N183 Chronic kidney disease, stage 3 unspecified: Secondary | ICD-10-CM | POA: Diagnosis not present

## 2020-10-08 DIAGNOSIS — E875 Hyperkalemia: Secondary | ICD-10-CM | POA: Diagnosis not present

## 2020-10-08 DIAGNOSIS — H5462 Unqualified visual loss, left eye, normal vision right eye: Secondary | ICD-10-CM | POA: Diagnosis not present

## 2020-10-08 DIAGNOSIS — I5033 Acute on chronic diastolic (congestive) heart failure: Secondary | ICD-10-CM | POA: Diagnosis not present

## 2020-10-08 LAB — POCT INR: INR: 2.5 (ref 2.0–3.0)

## 2020-10-08 NOTE — Patient Instructions (Signed)
Description   Spoke with Lawerance Bach from [facility] RN advised for pt to continue taking 1 tablet daily except for 1/2 tablet on Sundays.  Recheck INR in 2 weeks (being discharged from [facility] that day). [facility]#: Tabitha's [phone], [place]'s # [phone].

## 2020-10-09 ENCOUNTER — Ambulatory Visit (HOSPITAL_COMMUNITY): Payer: Medicare Other | Admitting: Physician Assistant

## 2020-10-09 ENCOUNTER — Telehealth: Payer: Self-pay

## 2020-10-09 MED ORDER — LOSARTAN POTASSIUM 25 MG PO TABS: 25.0000 mg | ORAL_TABLET | Freq: Every day | ORAL | 11 refills | Status: DC

## 2020-10-09 NOTE — Telephone Encounter (Signed)
Requesting a new prescription for Losartan 25mg  instead of the 50mg , to hard to split the pill.

## 2020-10-15 DIAGNOSIS — I2510 Atherosclerotic heart disease of native coronary artery without angina pectoris: Secondary | ICD-10-CM | POA: Diagnosis not present

## 2020-10-15 DIAGNOSIS — K3184 Gastroparesis: Secondary | ICD-10-CM | POA: Diagnosis not present

## 2020-10-15 DIAGNOSIS — Z950 Presence of cardiac pacemaker: Secondary | ICD-10-CM | POA: Diagnosis not present

## 2020-10-15 DIAGNOSIS — Z5181 Encounter for therapeutic drug level monitoring: Secondary | ICD-10-CM | POA: Diagnosis not present

## 2020-10-15 DIAGNOSIS — J301 Allergic rhinitis due to pollen: Secondary | ICD-10-CM | POA: Diagnosis not present

## 2020-10-15 DIAGNOSIS — H5462 Unqualified visual loss, left eye, normal vision right eye: Secondary | ICD-10-CM | POA: Diagnosis not present

## 2020-10-15 DIAGNOSIS — I272 Pulmonary hypertension, unspecified: Secondary | ICD-10-CM | POA: Diagnosis not present

## 2020-10-15 DIAGNOSIS — K219 Gastro-esophageal reflux disease without esophagitis: Secondary | ICD-10-CM | POA: Diagnosis not present

## 2020-10-15 DIAGNOSIS — I495 Sick sinus syndrome: Secondary | ICD-10-CM | POA: Diagnosis not present

## 2020-10-15 DIAGNOSIS — H6523 Chronic serous otitis media, bilateral: Secondary | ICD-10-CM | POA: Diagnosis not present

## 2020-10-15 DIAGNOSIS — Z85038 Personal history of other malignant neoplasm of large intestine: Secondary | ICD-10-CM | POA: Diagnosis not present

## 2020-10-15 DIAGNOSIS — I5033 Acute on chronic diastolic (congestive) heart failure: Secondary | ICD-10-CM | POA: Diagnosis not present

## 2020-10-15 DIAGNOSIS — N183 Chronic kidney disease, stage 3 unspecified: Secondary | ICD-10-CM | POA: Diagnosis not present

## 2020-10-15 DIAGNOSIS — Z952 Presence of prosthetic heart valve: Secondary | ICD-10-CM | POA: Diagnosis not present

## 2020-10-15 DIAGNOSIS — E875 Hyperkalemia: Secondary | ICD-10-CM | POA: Diagnosis not present

## 2020-10-15 DIAGNOSIS — I081 Rheumatic disorders of both mitral and tricuspid valves: Secondary | ICD-10-CM | POA: Diagnosis not present

## 2020-10-15 DIAGNOSIS — Z85028 Personal history of other malignant neoplasm of stomach: Secondary | ICD-10-CM | POA: Diagnosis not present

## 2020-10-15 DIAGNOSIS — G2581 Restless legs syndrome: Secondary | ICD-10-CM | POA: Diagnosis not present

## 2020-10-15 DIAGNOSIS — E039 Hypothyroidism, unspecified: Secondary | ICD-10-CM | POA: Diagnosis not present

## 2020-10-15 DIAGNOSIS — I130 Hypertensive heart and chronic kidney disease with heart failure and stage 1 through stage 4 chronic kidney disease, or unspecified chronic kidney disease: Secondary | ICD-10-CM | POA: Diagnosis not present

## 2020-10-15 DIAGNOSIS — G5791 Unspecified mononeuropathy of right lower limb: Secondary | ICD-10-CM | POA: Diagnosis not present

## 2020-10-15 DIAGNOSIS — E559 Vitamin D deficiency, unspecified: Secondary | ICD-10-CM | POA: Diagnosis not present

## 2020-10-15 DIAGNOSIS — I480 Paroxysmal atrial fibrillation: Secondary | ICD-10-CM | POA: Diagnosis not present

## 2020-10-15 DIAGNOSIS — Z7901 Long term (current) use of anticoagulants: Secondary | ICD-10-CM | POA: Diagnosis not present

## 2020-10-20 ENCOUNTER — Telehealth: Payer: Self-pay | Admitting: Cardiology

## 2020-10-20 DIAGNOSIS — I739 Peripheral vascular disease, unspecified: Secondary | ICD-10-CM

## 2020-10-20 NOTE — Telephone Encounter (Signed)
Spoke to patient's daughter Peter Congo Dr.Jordan's advice given.Scheduler will call back with lower ext arterial doppler appointment and appointment with Dr.Berry.

## 2020-10-20 NOTE — Telephone Encounter (Signed)
She did have significant PAD based on Pre TAVR CT. I think DVT is unlikely since she is on Coumadin. Sounds like swelling responded well to increased lasix. She should have LE arterial doppler studies and follow up with Dr Gwenlyn Found. I can't really comment about the Plaquenil. She will need to talk to whoever prescribed this and let them know her concerns.  Joseandres Mazer [name] MD, [facility]

## 2020-10-20 NOTE — Telephone Encounter (Signed)
Daughter of the patient called.   Pt c/o medication issue:  1. Name of Medication: hydroxychloroquine (PLAQUENIL) 200 MG tablet  2. How are you currently taking this medication (dosage and times per day)? Once daily  3. Are you having a reaction (difficulty breathing--STAT)?   4. What is your medication issue? Patient has been having side effects (headache, nausea, vomiting, loss of appetite) Daughter has reduced this medication to once daily to help reduce symptoms. She also noticed that the patient has been gaining weight even though she has not had an appetite  Pt c/o swelling: STAT is pt has developed SOB within 24 hours  1) How much weight have you gained and in what time span?   2) If swelling, where is the swelling located? R foot and calf    3) Are you currently taking a fluid pill? yes  4) Are you currently SOB?   5) Do you have a log of your daily weights (if so, list)?   6) Have you gained 3 pounds in a day or 5 pounds in a week? Patient gained 4 lbs ( was up to 97.2 yesterday) but has lost wt from lasix and is back to 93.4  7) Have you traveled recently? No  Daughter is also concerned about how the patient's foot is red and hot to the touch. The patient's foot is usually cold

## 2020-10-20 NOTE — Telephone Encounter (Signed)
Spoke with patient's daughter  She reports patient's skin looked "fake", red & shiny Her ankle is swelling, pitting edema yesterday - she took extra lasix 2/13 Daughter states her swelling has improved in RLE but is still red/warm to touch She states patient is supposed to see Dr. Gwenlyn Found for PAD but there is not recent doppler that I could locate in [facility] Patient was sick yesterday - throwing up (daughter reduced plaquenil dose d/t possible SE) Patient was short of breath on Saturday, wheezing per daughter & decreased energy Daughter is concerned about SE of plaquenil - shortness of breath, fatigue, N/V) Her weight was 97lbs and her weight is down to 93lbs today after extra lasix  Advised will send a message to Dr. [name] for advice - need vascular testing?

## 2020-10-22 ENCOUNTER — Ambulatory Visit (INDEPENDENT_AMBULATORY_CARE_PROVIDER_SITE_OTHER): Payer: Medicare Other | Admitting: *Deleted

## 2020-10-22 DIAGNOSIS — J301 Allergic rhinitis due to pollen: Secondary | ICD-10-CM | POA: Diagnosis not present

## 2020-10-22 DIAGNOSIS — I5033 Acute on chronic diastolic (congestive) heart failure: Secondary | ICD-10-CM | POA: Diagnosis not present

## 2020-10-22 DIAGNOSIS — E039 Hypothyroidism, unspecified: Secondary | ICD-10-CM | POA: Diagnosis not present

## 2020-10-22 DIAGNOSIS — I480 Paroxysmal atrial fibrillation: Secondary | ICD-10-CM | POA: Diagnosis not present

## 2020-10-22 DIAGNOSIS — Z950 Presence of cardiac pacemaker: Secondary | ICD-10-CM | POA: Diagnosis not present

## 2020-10-22 DIAGNOSIS — I2510 Atherosclerotic heart disease of native coronary artery without angina pectoris: Secondary | ICD-10-CM | POA: Diagnosis not present

## 2020-10-22 DIAGNOSIS — Z7901 Long term (current) use of anticoagulants: Secondary | ICD-10-CM | POA: Diagnosis not present

## 2020-10-22 DIAGNOSIS — G2581 Restless legs syndrome: Secondary | ICD-10-CM | POA: Diagnosis not present

## 2020-10-22 DIAGNOSIS — H5462 Unqualified visual loss, left eye, normal vision right eye: Secondary | ICD-10-CM | POA: Diagnosis not present

## 2020-10-22 DIAGNOSIS — I495 Sick sinus syndrome: Secondary | ICD-10-CM | POA: Diagnosis not present

## 2020-10-22 DIAGNOSIS — E875 Hyperkalemia: Secondary | ICD-10-CM | POA: Diagnosis not present

## 2020-10-22 DIAGNOSIS — I081 Rheumatic disorders of both mitral and tricuspid valves: Secondary | ICD-10-CM | POA: Diagnosis not present

## 2020-10-22 DIAGNOSIS — Z5181 Encounter for therapeutic drug level monitoring: Secondary | ICD-10-CM | POA: Diagnosis not present

## 2020-10-22 DIAGNOSIS — K3184 Gastroparesis: Secondary | ICD-10-CM | POA: Diagnosis not present

## 2020-10-22 DIAGNOSIS — Z952 Presence of prosthetic heart valve: Secondary | ICD-10-CM | POA: Diagnosis not present

## 2020-10-22 DIAGNOSIS — Z85028 Personal history of other malignant neoplasm of stomach: Secondary | ICD-10-CM | POA: Diagnosis not present

## 2020-10-22 DIAGNOSIS — H6523 Chronic serous otitis media, bilateral: Secondary | ICD-10-CM | POA: Diagnosis not present

## 2020-10-22 DIAGNOSIS — I130 Hypertensive heart and chronic kidney disease with heart failure and stage 1 through stage 4 chronic kidney disease, or unspecified chronic kidney disease: Secondary | ICD-10-CM | POA: Diagnosis not present

## 2020-10-22 DIAGNOSIS — I272 Pulmonary hypertension, unspecified: Secondary | ICD-10-CM | POA: Diagnosis not present

## 2020-10-22 DIAGNOSIS — Z85038 Personal history of other malignant neoplasm of large intestine: Secondary | ICD-10-CM | POA: Diagnosis not present

## 2020-10-22 DIAGNOSIS — G5791 Unspecified mononeuropathy of right lower limb: Secondary | ICD-10-CM | POA: Diagnosis not present

## 2020-10-22 DIAGNOSIS — E559 Vitamin D deficiency, unspecified: Secondary | ICD-10-CM | POA: Diagnosis not present

## 2020-10-22 DIAGNOSIS — K219 Gastro-esophageal reflux disease without esophagitis: Secondary | ICD-10-CM | POA: Diagnosis not present

## 2020-10-22 DIAGNOSIS — N183 Chronic kidney disease, stage 3 unspecified: Secondary | ICD-10-CM | POA: Diagnosis not present

## 2020-10-22 LAB — POCT INR: INR: 3.3 — AB (ref 2.0–3.0)

## 2020-10-22 NOTE — Patient Instructions (Signed)
Description   Spoke with [place] from [place] advised for pt to hold today's dose then continue taking 1 tablet daily except for 1/2 tablet on Sundays.  Recheck INR in 2 weeks in the office.  [facility]#: [facility] [phone], [place] # [phone].

## 2020-10-23 ENCOUNTER — Other Ambulatory Visit (HOSPITAL_COMMUNITY): Payer: Self-pay | Admitting: *Deleted

## 2020-10-23 MED ORDER — METOPROLOL SUCCINATE ER 50 MG PO TB24: 50.0000 mg | ORAL_TABLET | Freq: Two times a day (BID) | ORAL | 2 refills | Status: DC

## 2020-10-24 ENCOUNTER — Other Ambulatory Visit (HOSPITAL_COMMUNITY): Payer: Self-pay | Admitting: Cardiology

## 2020-10-24 DIAGNOSIS — I739 Peripheral vascular disease, unspecified: Secondary | ICD-10-CM

## 2020-10-29 ENCOUNTER — Ambulatory Visit (HOSPITAL_COMMUNITY)
Admission: RE | Admit: 2020-10-29 | Discharge: 2020-10-29 | Disposition: A | Discharge status: Home / Self Care | Payer: Medicare Other | Source: Ambulatory Visit | Attending: Cardiovascular Disease | Admitting: Cardiovascular Disease

## 2020-10-29 ENCOUNTER — Other Ambulatory Visit: Payer: Self-pay

## 2020-10-29 ENCOUNTER — Ambulatory Visit (HOSPITAL_BASED_OUTPATIENT_CLINIC_OR_DEPARTMENT_OTHER): Payer: Medicare Other

## 2020-10-29 DIAGNOSIS — Z952 Presence of prosthetic heart valve: Secondary | ICD-10-CM | POA: Diagnosis not present

## 2020-10-29 DIAGNOSIS — I739 Peripheral vascular disease, unspecified: Secondary | ICD-10-CM | POA: Diagnosis not present

## 2020-10-29 LAB — ECHOCARDIOGRAM COMPLETE
AR max vel: 1.19 cm2
AV Area VTI: 1.38 cm2
AV Area mean vel: 1.24 cm2
AV Mean grad: 10.5 mmHg
AV Peak grad: 21.7 mmHg
Ao pk vel: 2.33 m/s
Area-P 1/2: 3.13 cm2
P 1/2 time: 409 msec
S' Lateral: 2.3 cm

## 2020-11-03 ENCOUNTER — Telehealth: Payer: Self-pay

## 2020-11-03 NOTE — Telephone Encounter (Signed)
-----   Message from Eileen Stanford, PA-C sent at 11/03/2020  8:39 AM EST ----- EF 60%, mild-moderate LVH, G2DD, mild RV dysfunction/enlargement, severe biatrial enlargement, moderate MAC with trivial MR, normally functioning TAVR with a mean gradient of 10 mm hg and mild-moderate PVL most notable in the 5 o'clock position on SA view (this this has been stable since deployment) and mild to moderate dilatation of the ascending aorta. She sees Dr. [name] on 3/14 which can count as her 1 year follow up s/p TAVR.

## 2020-11-03 NOTE — Telephone Encounter (Signed)
The patient & her daughter have been notified of the result and verbalized understanding.  All questions (if any) were answered. Wilma Flavin, RN 11/03/2020 10:08 AM

## 2020-11-06 ENCOUNTER — Ambulatory Visit (INDEPENDENT_AMBULATORY_CARE_PROVIDER_SITE_OTHER): Payer: Medicare Other

## 2020-11-06 ENCOUNTER — Other Ambulatory Visit: Payer: Self-pay

## 2020-11-06 DIAGNOSIS — I4891 Unspecified atrial fibrillation: Secondary | ICD-10-CM

## 2020-11-06 DIAGNOSIS — Z7901 Long term (current) use of anticoagulants: Secondary | ICD-10-CM

## 2020-11-06 LAB — POCT INR: INR: 2.5 (ref 2.0–3.0)

## 2020-11-06 NOTE — Patient Instructions (Signed)
Description   Continue on same dosage 1 tablet daily except for 1/2 tablet on Sundays.  Recheck INR in 3 weeks.  [facility] [phone].

## 2020-11-07 ENCOUNTER — Ambulatory Visit: Payer: Medicare Other | Admitting: Cardiovascular Disease

## 2020-11-07 ENCOUNTER — Encounter: Payer: Self-pay | Admitting: Cardiovascular Disease

## 2020-11-07 VITALS — BP 128/60 | HR 74 | Ht 59.0 in | Wt 95.8 lb

## 2020-11-07 DIAGNOSIS — I739 Peripheral vascular disease, unspecified: Secondary | ICD-10-CM | POA: Diagnosis not present

## 2020-11-07 DIAGNOSIS — I4891 Unspecified atrial fibrillation: Secondary | ICD-10-CM

## 2020-11-07 DIAGNOSIS — I10 Essential (primary) hypertension: Secondary | ICD-10-CM | POA: Diagnosis not present

## 2020-11-07 NOTE — H&P (View-Only) (Signed)
11/07/2020 [name]   May 09, 1934  [id number]  Primary Physician [name], MD Primary Cardiologist: Lorretta Harp MD Lupe Carney, [place]  HPI:  [name] is a 87 y.o. 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  She was referred by Dr. [name], her primary cardiologist for peripheral vascular evaluation because of right lower extremity lifestyle limiting claudication.  She did have a TAVR procedure via the left carotid approach 10/30/2019.  Otherwise she has a history of tachybradycardia syndrome status post permanent transvenous pacemaker insertion, hypertension, hyperlipidemia and a solitary kidney with CKD stage III.  She is a Restaurant manager, fast food.  She had a CTA performed in her lower extremities 10/16/2019 revealing diffuse calcified aortoiliac disease with significant right extra iliac artery stenosis which is confirmed by her Doppler studies performed 10/29/2020 revealing a right ABI of 0.77 with a right external iliac artery stenosis.  She does have lifestyle limiting right lower extremity claudication.   Current Meds  Medication Sig  . acetaminophen (TYLENOL) 500 MG tablet Take 500 mg by mouth at bedtime.  . ALPRAZolam (XANAX) 0.25 MG tablet Take 0.5-1 tablets (0.125-0.25 mg total) by mouth 2 (two) times daily as needed for anxiety.  . Alum & Mag Hydroxide-Simeth (MYLANTA PO) Take 20 mLs by mouth every evening.   [name] amoxicillin (AMOXIL) 500 MG tablet Take 4 tablets (2,000 mg total) by mouth as directed. 1 hour prior to dental work including cleanings  . Ascorbic Acid (VITAMIN C) 1000 MG tablet Take 1,000 mg by mouth daily.  . Cholecalciferol (VITAMIN D-3) 5000 units TABS Take 5,000 Units by mouth 3 (three) times daily.   . Cyanocobalamin 5000 MCG SUBL Place 5,000 mcg under the tongue daily.   [name] erythromycin  ophthalmic ointment Place 1 application into both eyes at bedtime as needed (stye).   . famotidine (PEPCID) 40 MG tablet TAKE 1 TABLET DAILY FOR INDIGESTION & HEARTBURN  . ferrous sulfate 325 (65 FE) MG tablet Take 325 mg by mouth daily in the afternoon.  . furosemide (LASIX) 20 MG tablet Take 1 tablet (20 mg total) by mouth daily.  [name] gabapentin (NEURONTIN) 300 MG capsule TAKE 1 CAPSULE BY MOUTH THREE TIMES A DAY  . hydrocortisone (ANUSOL-HC) 2.5 % rectal cream Place rectally 2 (two) times daily.  [name] levothyroxine (SYNTHROID) 100 MCG tablet TAKE 1/2 TABLET DAILY FOR THYROID.  [name] loperamide (IMODIUM A-D) 2 MG tablet Take up to 12 tablets /day as needed for  for Diarrhea  . loratadine (CLARITIN) 10 MG tablet Take 10 mg by mouth at bedtime.  [name] losartan (COZAAR) 25 MG tablet Take 1 tablet (25 mg total) by mouth daily.  [name] losartan (COZAAR) 50 MG tablet Take 1 tablet (50 mg total) by mouth daily.  . metoprolol succinate (TOPROL-XL) 50 MG 24 hr tablet Take 1 tablet (50 mg total) by mouth in the morning and at bedtime.  . nitroGLYCERIN (NITROSTAT) 0.4 MG SL tablet Dissolve 1 tablet under tongue every 5 minutes if needed for Angina  . ondansetron (ZOFRAN-ODT) 8 MG disintegrating tablet Dissolve 1 tablet under tongue every 6 to 8 hours  3 x /day if needed for Nausea or Vomitting  . potassium chloride (KLOR-CON) 10 MEQ tablet Take 1 tablet (10 mEq total) by mouth daily.  . pravastatin (PRAVACHOL) 20 MG tablet Take 1 tablet (20 mg total)  by mouth daily.  [name] Propylene Glycol (SYSTANE COMPLETE) 0.6 % SOLN Place 1 drop into both eyes 3 (three) times daily as needed (irritation/dry eyes.).  [name] simethicone (MYLICON) 80 MG chewable tablet Chew 80 mg by mouth every 6 (six) hours as needed for flatulence.  . sucralfate (CARAFATE) 1 g tablet Take 1-2 g by mouth See admin instructions. Take 2 tablets (2 g) by mouth in the morning & take 1 tablet (1g) by mouth at lunch  . tobramycin-dexamethasone (TOBRADEX) ophthalmic  solution Place 1 drop into both eyes daily as needed (stye).   [name] triamcinolone cream (KENALOG) 0.1 % Apply 1 application topically 2 (two) times daily as needed (rash/skin irritation.).   [name] verapamil (CALAN-SR) 120 MG CR tablet TAKE 1 TABLET 2 X /DAY WITH A MEAL FOR BP & HEART RHYTHM  . warfarin (COUMADIN) 2 MG tablet Take      as directed       to Prevent Blood Clots (Patient taking differently: Take one tablet daily and 1/2 tablet on Sunday)     Allergies  Allergen Reactions  . Amiodarone Other (See Comments)     Thyroid and liver and kidney problems  . Adhesive [Tape] Other (See Comments)    Tears skin, Please use "paper" tape  . Hydrocodone Other (See Comments)    HALLLUCINATIONS  . Other Other (See Comments)    NO BLOOD PRODUCTS -  Jehoval Witness  . Reglan [Metoclopramide] Hives, Itching and Rash  . Remeron [Mirtazapine] Other (See Comments)    Hallucinations and make pt loopy  . Codeine Nausea And Vomiting  . Morphine And Related Nausea And Vomiting  . Requip [Ropinirole Hcl] Other (See Comments)    Headache   . Zinc Nausea Only    Social History   Socioeconomic History  . Marital status: Widowed    Spouse name: Not on file  . Number of children: 3  . Years of education: Not on file  . Highest education level: Not on file  Occupational History  . Not on file  Tobacco Use  . Smoking status: Never Smoker  . Smokeless tobacco: Never Used  Vaping Use  . Vaping Use: Never used  Substance and Sexual Activity  . Alcohol use: No  . Drug use: No  . Sexual activity: Never    Comment: Hysterectomy  Other Topics Concern  . Not on file  Social History Narrative   01/01/19 Lives in [place] w/dgtr, [name].  Continues to work.   Social Determinants of Health   Financial Resource Strain: Not on file  Food Insecurity: Not on file  Transportation Needs: Not on file  Physical Activity: Not on file  Stress: Not on file  Social Connections: Not on file  Intimate  Partner Violence: Not on file     Review of Systems: General: negative for chills, fever, night sweats or weight changes.  Cardiovascular: negative for chest pain, dyspnea on exertion, edema, orthopnea, palpitations, paroxysmal nocturnal dyspnea or shortness of breath Dermatological: negative for rash Respiratory: negative for cough or wheezing Urologic: negative for hematuria Abdominal: negative for nausea, vomiting, diarrhea, bright red blood per rectum, melena, or hematemesis Neurologic: negative for visual changes, syncope, or dizziness All other systems reviewed and are otherwise negative except as noted above.    Blood pressure 128/60, pulse 74, height _0  (1.499 m), weight 95 lb 12.8 oz (43.5 kg), SpO2 97 %.  General appearance: alert and no distress Neck: no adenopathy, no carotid bruit, no JVD, supple,  symmetrical, trachea midline and thyroid not enlarged, symmetric, no tenderness/mass/nodules Lungs: clear to auscultation bilaterally Heart: Aortic stenosis/aortic insufficiency murmur Extremities: extremities normal, atraumatic, no cyanosis or edema Pulses: 2+ and symmetric Skin: Skin color, texture, turgor normal. No rashes or lesions Neurologic: Alert and oriented X 3, normal strength and tone. Normal symmetric reflexes. Normal coordination and gait  EKG AV dual paced rhythm at 74.  I personally reviewed this EKG.  ASSESSMENT AND PLAN:   PAD (peripheral artery disease) ([place]) Ms. Blackard was referred to me by Dr. [name] for symptomatic right lower extremity claudication.  She did have a CTA done for pre-TAVR work-up on 10/16/2019 showing to diffuse aortoiliac calcification with right external iliac artery stenosis.  She had Doppler studies performed in our office 10/29/2020 revealing a right ABI of 0.77 and a left of 1.15 with a high-frequency signal in her right external iliac artery.  She is going to discuss with her family whether to proceed with angiography and potential  endovascular therapy.      Lorretta Harp MD FACP,FACC,FAHA, [facility] 11/07/2020 11:10 AM

## 2020-11-07 NOTE — Assessment & Plan Note (Signed)
[name] was referred to me by Dr. [name] for symptomatic right lower extremity claudication.  She did have a CTA done for pre-TAVR work-up on 10/16/2019 showing to diffuse aortoiliac calcification with right external iliac artery stenosis.  She had Doppler studies performed in our office 10/29/2020 revealing a right ABI of 0.77 and a left of 1.15 with a high-frequency signal in her right external iliac artery.  She is going to discuss with her family whether to proceed with angiography and potential endovascular therapy.

## 2020-11-07 NOTE — Progress Notes (Signed)
11/07/2020 [name]   [birth year]  [id number]  Primary Physician [name], MD Primary Cardiologist: Lorretta Harp MD Lupe Carney, [place]  HPI:  [name] is a 87 y.o. 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  She was referred by Dr. [name], her primary cardiologist for peripheral vascular evaluation because of right lower extremity lifestyle limiting claudication.  She did have a TAVR procedure via the left carotid approach 10/30/2019.  Otherwise she has a history of tachybradycardia syndrome status post permanent transvenous pacemaker insertion, hypertension, hyperlipidemia and a solitary kidney with CKD stage III.  She is a Restaurant manager, fast food.  She had a CTA performed in her lower extremities 10/16/2019 revealing diffuse calcified aortoiliac disease with significant right extra iliac artery stenosis which is confirmed by her Doppler studies performed 10/29/2020 revealing a right ABI of 0.77 with a right external iliac artery stenosis.  She does have lifestyle limiting right lower extremity claudication.   Current Meds  Medication Sig  . acetaminophen (TYLENOL) 500 MG tablet Take 500 mg by mouth at bedtime.  . ALPRAZolam (XANAX) 0.25 MG tablet Take 0.5-1 tablets (0.125-0.25 mg total) by mouth 2 (two) times daily as needed for anxiety.  . Alum & Mag Hydroxide-Simeth (MYLANTA PO) Take 20 mLs by mouth every evening.   [name] amoxicillin (AMOXIL) 500 MG tablet Take 4 tablets (2,000 mg total) by mouth as directed. 1 hour prior to dental work including cleanings  . Ascorbic Acid (VITAMIN C) 1000 MG tablet Take 1,000 mg by mouth daily.  . Cholecalciferol (VITAMIN D-3) 5000 units TABS Take 5,000 Units by mouth 3 (three) times daily.   . Cyanocobalamin 5000 MCG SUBL Place 5,000 mcg under the tongue daily.   [name] erythromycin  ophthalmic ointment Place 1 application into both eyes at bedtime as needed (stye).   . famotidine (PEPCID) 40 MG tablet TAKE 1 TABLET DAILY FOR INDIGESTION & HEARTBURN  . ferrous sulfate 325 (65 FE) MG tablet Take 325 mg by mouth daily in the afternoon.  . furosemide (LASIX) 20 MG tablet Take 1 tablet (20 mg total) by mouth daily.  [name] gabapentin (NEURONTIN) 300 MG capsule TAKE 1 CAPSULE BY MOUTH THREE TIMES A DAY  . hydrocortisone (ANUSOL-HC) 2.5 % rectal cream Place rectally 2 (two) times daily.  [name] levothyroxine (SYNTHROID) 100 MCG tablet TAKE 1/2 TABLET DAILY FOR THYROID.  [name] loperamide (IMODIUM A-D) 2 MG tablet Take up to 12 tablets /day as needed for  for Diarrhea  . loratadine (CLARITIN) 10 MG tablet Take 10 mg by mouth at bedtime.  [name] losartan (COZAAR) 25 MG tablet Take 1 tablet (25 mg total) by mouth daily.  [name] losartan (COZAAR) 50 MG tablet Take 1 tablet (50 mg total) by mouth daily.  . metoprolol succinate (TOPROL-XL) 50 MG 24 hr tablet Take 1 tablet (50 mg total) by mouth in the morning and at bedtime.  . nitroGLYCERIN (NITROSTAT) 0.4 MG SL tablet Dissolve 1 tablet under tongue every 5 minutes if needed for Angina  . ondansetron (ZOFRAN-ODT) 8 MG disintegrating tablet Dissolve 1 tablet under tongue every 6 to 8 hours  3 x /day if needed for Nausea or Vomitting  . potassium chloride (KLOR-CON) 10 MEQ tablet Take 1 tablet (10 mEq total) by mouth daily.  . pravastatin (PRAVACHOL) 20 MG tablet Take 1 tablet (20 mg total)  by mouth daily.  [name] Propylene Glycol (SYSTANE COMPLETE) 0.6 % SOLN Place 1 drop into both eyes 3 (three) times daily as needed (irritation/dry eyes.).  [name] simethicone (MYLICON) 80 MG chewable tablet Chew 80 mg by mouth every 6 (six) hours as needed for flatulence.  . sucralfate (CARAFATE) 1 g tablet Take 1-2 g by mouth See admin instructions. Take 2 tablets (2 g) by mouth in the morning & take 1 tablet (1g) by mouth at lunch  . tobramycin-dexamethasone (TOBRADEX) ophthalmic  solution Place 1 drop into both eyes daily as needed (stye).   [name] triamcinolone cream (KENALOG) 0.1 % Apply 1 application topically 2 (two) times daily as needed (rash/skin irritation.).   [name] verapamil (CALAN-SR) 120 MG CR tablet TAKE 1 TABLET 2 X /DAY WITH A MEAL FOR BP & HEART RHYTHM  . warfarin (COUMADIN) 2 MG tablet Take      as directed       to Prevent Blood Clots (Patient taking differently: Take one tablet daily and 1/2 tablet on Sunday)     Allergies  Allergen Reactions  . Amiodarone Other (See Comments)     Thyroid and liver and kidney problems  . Adhesive [Tape] Other (See Comments)    Tears skin, Please use "paper" tape  . Hydrocodone Other (See Comments)    HALLLUCINATIONS  . Other Other (See Comments)    NO BLOOD PRODUCTS -  Jehoval Witness  . Reglan [Metoclopramide] Hives, Itching and Rash  . Remeron [Mirtazapine] Other (See Comments)    Hallucinations and make pt loopy  . Codeine Nausea And Vomiting  . Morphine And Related Nausea And Vomiting  . Requip [Ropinirole Hcl] Other (See Comments)    Headache   . Zinc Nausea Only    Social History   Socioeconomic History  . Marital status: Widowed    Spouse name: Not on file  . Number of children: 3  . Years of education: Not on file  . Highest education level: Not on file  Occupational History  . Not on file  Tobacco Use  . Smoking status: Never Smoker  . Smokeless tobacco: Never Used  Vaping Use  . Vaping Use: Never used  Substance and Sexual Activity  . Alcohol use: No  . Drug use: No  . Sexual activity: Never    Comment: Hysterectomy  Other Topics Concern  . Not on file  Social History Narrative   01/01/19 Lives in [place] w/dgtr, [name].  Continues to work.   Social Determinants of Health   Financial Resource Strain: Not on file  Food Insecurity: Not on file  Transportation Needs: Not on file  Physical Activity: Not on file  Stress: Not on file  Social Connections: Not on file  Intimate  Partner Violence: Not on file     Review of Systems: General: negative for chills, fever, night sweats or weight changes.  Cardiovascular: negative for chest pain, dyspnea on exertion, edema, orthopnea, palpitations, paroxysmal nocturnal dyspnea or shortness of breath Dermatological: negative for rash Respiratory: negative for cough or wheezing Urologic: negative for hematuria Abdominal: negative for nausea, vomiting, diarrhea, bright red blood per rectum, melena, or hematemesis Neurologic: negative for visual changes, syncope, or dizziness All other systems reviewed and are otherwise negative except as noted above.    Blood pressure 128/60, pulse 74, height _0  (1.499 m), weight 95 lb 12.8 oz (43.5 kg), SpO2 97 %.  General appearance: alert and no distress Neck: no adenopathy, no carotid bruit, no JVD, supple,  symmetrical, trachea midline and thyroid not enlarged, symmetric, no tenderness/mass/nodules Lungs: clear to auscultation bilaterally Heart: Aortic stenosis/aortic insufficiency murmur Extremities: extremities normal, atraumatic, no cyanosis or edema Pulses: 2+ and symmetric Skin: Skin color, texture, turgor normal. No rashes or lesions Neurologic: Alert and oriented X 3, normal strength and tone. Normal symmetric reflexes. Normal coordination and gait  EKG AV dual paced rhythm at 74.  I personally reviewed this EKG.  ASSESSMENT AND PLAN:   PAD (peripheral artery disease) ([place]) Ms. Moor was referred to me by Dr. [name] for symptomatic right lower extremity claudication.  She did have a CTA done for pre-TAVR work-up on 10/16/2019 showing to diffuse aortoiliac calcification with right external iliac artery stenosis.  She had Doppler studies performed in our office 10/29/2020 revealing a right ABI of 0.77 and a left of 1.15 with a high-frequency signal in her right external iliac artery.  She is going to discuss with her family whether to proceed with angiography and potential  endovascular therapy.      Lorretta Harp MD FACP,FACC,FAHA, [facility] 11/07/2020 11:10 AM

## 2020-11-07 NOTE — Patient Instructions (Signed)
Medication Instructions:  Your physician recommends that you continue on your current medications as directed. Please refer to the Current Medication list given to you today.  *If you need a refill on your cardiac medications before your next appointment, please call your pharmacy*   Follow-Up: At [facility], you and your health needs are our priority.  As part of our continuing mission to provide you with exceptional heart care, we have created designated Provider Care Teams.  These Care Teams include your primary Cardiologist (physician) and Advanced Practice Providers (APPs -  Physician Assistants and Nurse Practitioners) who all work together to provide you with the care you need, when you need it.  We recommend signing up for the patient portal called "MyChart".  Sign up information is provided on this After Visit Summary.  MyChart is used to connect with patients for Virtual Visits (Telemedicine).  Patients are able to view lab/test results, encounter notes, upcoming appointments, etc.  Non-urgent messages can be sent to your provider as well.   To learn more about what you can do with MyChart, go to [email or web address].    Your next appointment:   No future appointments made at this time. We will see you on an as needed basis. Please call our office to make an appointment.  Provider:   Quay Burow, MD

## 2020-11-10 ENCOUNTER — Telehealth: Payer: Self-pay | Admitting: *Deleted

## 2020-11-10 ENCOUNTER — Telehealth: Payer: Self-pay | Admitting: Cardiovascular Disease

## 2020-11-10 DIAGNOSIS — I739 Peripheral vascular disease, unspecified: Secondary | ICD-10-CM

## 2020-11-10 DIAGNOSIS — Z01812 Encounter for preprocedural laboratory examination: Secondary | ICD-10-CM

## 2020-11-10 NOTE — Telephone Encounter (Signed)
Pt's daughter called and stated that pt is going to have a procedure on 3/24 by Dr. Gwenlyn Found . Daughter also stated that she was told by Dr. Kennon Holter nuse that pt was to start holding warfarin on 3/18. ( Pt has been bridged in the past with Lovenox.) Attempted to schedule pt to come in on 3/18 to have INR checked, however pt lives in [place] and is to see Dr. [name] on 3/14. Informed daughter that I would schedule pt to have INR checked on 3/14 after she sees Dr. [name].

## 2020-11-10 NOTE — Telephone Encounter (Signed)
Spoke with pt's daughter (ok per DPR), Wess Botts. Pt is ready to schedule pv procedure that was discussed at [facility] on March 4th. Procedure scheduled for March 24th at 7:30am. All pre-procedure labs and covid swab discussed. Covid swab scheduled. Instructions reviewed with daughter including holding coumadin and lasix for procedure. Daughter verbalizes understanding. Explained to daughter that lab slips and instructions will be placed in the mail. Orders placed.

## 2020-11-10 NOTE — Telephone Encounter (Signed)
New Message:     Daughter is calling to let you know the pt is ready to schedule the procedure that was discussed with Dr Gwenlyn Found on Friday.

## 2020-11-11 ENCOUNTER — Other Ambulatory Visit: Payer: Self-pay

## 2020-11-11 DIAGNOSIS — I739 Peripheral vascular disease, unspecified: Secondary | ICD-10-CM

## 2020-11-11 MED ORDER — SODIUM CHLORIDE 0.9% FLUSH: 3.0000 mL | Freq: Two times a day (BID) | INTRAVENOUS | Status: DC

## 2020-11-14 DIAGNOSIS — L931 Subacute cutaneous lupus erythematosus: Secondary | ICD-10-CM | POA: Diagnosis not present

## 2020-11-14 NOTE — Patient Instructions (Addendum)
  Take 2 tablets tonight only and then Continue on same dosage 1 tablet daily except for 1/2 tablet on Sundays.  Recheck INR in 3 weeks.  Coumadin Clinic [phone]. Lovenox Bridging   3/18: Last dose of warfarin.  3/19: No warfarin or enoxaparin (Lovenox).  3/20: Inject enoxaparin 40 mg in the fatty abdominal tissue at least 2 inches from the belly button twice a day about 12 hours apart, 8am and 8pm rotate sites. No warfarin.  3/21: Inject enoxaparin in the fatty tissue every 12 hours, 8am and 8pm. No warfarin.  3/22: Inject enoxaparin in the fatty tissue every 12 hours, 8am and 8pm. No warfarin.  3/23: Inject enoxaparin in the fatty tissue in the morning at 8 am (No PM dose). No warfarin.  3/24: Procedure Day - No enoxaparin - Resume warfarin in the evening or as directed by doctor (take an extra half tablet with usual dose for 2 days then resume normal dose).  3/25: Resume enoxaparin inject in the fatty tissue every 12 hours and take warfarin  3/26: Inject enoxaparin in the fatty tissue every 12 hours and take warfarin  3/27: Inject enoxaparin in the fatty tissue every 12 hours and take warfarin  3/28: Inject enoxaparin in the fatty tissue every 12 hours and take warfarin  3/29: Inject enoxaparin in the fatty tissue every 12 hours and take warfarin  3/30: warfarin appt to check INR.

## 2020-11-14 NOTE — Progress Notes (Signed)
Cardiology Office Note   Date:  11/17/2020   ID:  [name], DOB 31-Aug-1934, MRN [id number]  PCP:  [name], MD  Cardiologist:  Jayziah Bankhead [name], MD EP: Thompson Grayer, MD  Chief Complaint  Patient presents with  . Follow-up    1 year.  [name] Headache  . Edema  . Shortness of Breath  . Aortic Stenosis  . Congestive Heart Failure      History of Present Illness: [name] is a 87 y.o. 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  She was  evaluated in December 2020 with   Echo study which showed EF 60-65%, moderate LVH, unable to assess LV diastolic function, no RWMA, mild LAE, mild MR, and severely calcified AV with suspected low-flow low gradient moderate-severe AS and moderate AI. She reported increased DOE requiring more lasix, as well at increased heart rates. On review of her echocardiogram, she was back in atrial fibrillation. She had increased edema.   Diagnostic cardiac catheterization was performed  on October 09, 2019 revealed nonobstructive coronary artery disease. Peak to peak and mean transvalvular gradients across aortic valve were measured 19 and 11.4 mmHg respectively corresponding to aortic valve area calculated only 0.84 cm.  The patient was evaluated by the multidisciplinary valve team and underwent successful TAVR with a97mm Edwards Sapien 3 UltraTHV via the left carotidapproach on 10/30/19. Post operative echoshowed EF 70% s/p TAVR with mild-mod PVL and mean gradient of 7 mm Hg. Of note, pre op labs showed a markedly elevated BNP >2000 and some mild pulmonary congestion on CXR. There was some concern for potential valve migration. However, the patient was feeling well and  was discharged on POD 1 (10/31/19). She was discharged on coumadin alone given high risk of bleed.   She subsequently called with  acutely worsening shortness of breath, chest pressure, weight gain and orthopnea. She did improve with increase in lasix to 40 mg daily.  Limited echo 11/05/19 showed stable mild-mod PVL. BP was high and amlodipine was added. When seen in valve clinic on March 18 she was in Afib. She underwent DCCV  12/05/19.   She was seen in May by Coletta Memos NP. Noted elevated BP and was having intermittent elevated heart rates. Metoprolol 50 mg was added. Pacer check in April showed no recurrent Afib.   More recently she was seen in the Afib clinic by Alden Benjamin PA on 06/09/20. The device clinic received an alert for an ongoing afib episode starting 06/04/20 with rapid rates. She has noted symptoms of increased fatigue, dyspnea with exertion, and palpitations. There were no triggers that she could identify. She underwent TEE guided DCCV on 06/13/20. TEE showed mild to moderate AI. Normal LV function. No thrombus. Subsequent Pacer check in January showed persistent Afib since December. Upon review with Dr Rayann Heman rate control strategy was recommended.   She has been evaluated by Dr Gwenlyn Found for claudication and angiography is planned.   On follow up today she is seen with her daughter. She states she can tell she has some SOB almost daily with activity but is able to do her housework. She feels she is doing well.  No longer aware of palpitations, chest pain. BP has been controlled. Her primary care started  her on pravastatin in Dec. LFTs in Jan were normal. For some reason her pravastatin was stopped and her daughter doesn't know why.   Past Medical History:  Diagnosis Date  . Adenocarcinoma of stomach ([place]) 11/11/2015   gastric mass on egd,   . Anal fissure   . Anemia    hx 3/17 iron infusion, on aranesp and injected B12 since 11/2015.   [name] Anxiety   . Aortic root dilatation ([name])   .  Arthritis    oa  . Bladder cancer ([place])   . Blind left eye 2015   partial blind in left eye due to stroke   . 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 CAD (coronary artery disease)   . Cataracts, bilateral   . Chronic kidney disease    only has one kidney rt lft rem 03 ca     dr. Risa Grill, bladder cancer 05-04-2018  . Elevated LFTs    hepatic steatosis, none recent  . Endometrial cancer ([place])   . Gastroparesis   . GERD (gastroesophageal reflux disease)   . Hearing loss    wears hearing aids  . Heart murmur   . History of uterine cancer 1980s   treated with hysterectomy, external radiation and radiation seed implants.   [name] HTN (hypertension)   . Hyperlipemia   . Hypothyroidism   . Iron deficiency anemia due to chronic blood loss 11/24/2015  . Neuropathy    right leg  . Osteomyelitis ([place]) as child  . PAF (paroxysmal atrial fibrillation) ([place])   . Presence of permanent cardiac pacemaker   . Refusal of blood transfusions as patient is Jehovah's Witness   . Restless leg syndrome   . 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 Severe aortic stenosis   . Stroke [facility]) 2015   partial stroke left legally blind; left eye   . Tachycardia-bradycardia syndrome ([place])    s/p PPM by Dr Doreatha Lew (MDT) 07/03/10  . 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 Walker as ambulation aid   . Wears glasses   . Wears partial dentures    upper    Past Surgical History:  Procedure Laterality Date  . CARDIAC CATHETERIZATION  2008  . CARDIOVERSION N/A 12/05/2019   Procedure: CARDIOVERSION;  Surgeon: Elouise Munroe, MD;  Location: [facility] ENDOSCOPY;  Service: Cardiovascular;  Laterality: N/A;  . CARDIOVERSION N/A 06/13/2020   Procedure: CARDIOVERSION;  Surgeon: Buford Dresser, MD;  Location: [facility] ENDOSCOPY;  Service: Cardiovascular;  Laterality: N/A;  . CHOLECYSTECTOMY  1970s  .  COLONOSCOPY N/A 11/11/2015   Procedure: COLONOSCOPY;  Surgeon: [name], MD;  Location: [facility] ENDOSCOPY;  Service: Endoscopy;  Laterality: N/A;  . CYSTOSCOPY W/ RETROGRADES Right 05/01/2018   Procedure: CYSTOSCOPY WITH RIGHT RETROGRADE PYELOGRAM;  Surgeon: Lucas Mallow, MD;  Location: [facility] ORS;  Service: Urology;  Laterality: Right;  . CYSTOSCOPY W/ RETROGRADES Right 07/21/2018   Procedure: CYSTOSCOPY WITH RIGHT RETROGRADE PYELOGRAM;  Surgeon: Alexis Frock, MD;  Location: [facility] ORS;  Service: Urology;  Laterality: Right;  . ESOPHAGOGASTRODUODENOSCOPY N/A 11/11/2015   Procedure: ESOPHAGOGASTRODUODENOSCOPY (EGD);  Surgeon: [name], MD;  Location: [facility] ENDOSCOPY;  Service: Endoscopy;  Laterality: N/A;  . ESOPHAGOGASTRODUODENOSCOPY N/A 02/05/2016   Procedure: ESOPHAGOGASTRODUODENOSCOPY (EGD);  Surgeon: Ladene Artist, MD;  Location[facility] ENDOSCOPY;  Service: Endoscopy;  Laterality: N/A;  . ESOPHAGOGASTRODUODENOSCOPY N/A 04/02/2016   Procedure: ESOPHAGOGASTRODUODENOSCOPY (  EGD);  Surgeon: [name], MD;  [facility] ENDOSCOPY;  Service: Endoscopy;  Laterality: N/A;  . ESOPHAGOGASTRODUODENOSCOPY (EGD) WITH PROPOFOL N/A 04/06/2017   Procedure: ESOPHAGOGASTRODUODENOSCOPY (EGD) WITH PROPOFOL;  Surgeon: [name], MD;  [facility]: [facility] ENDOSCOPY;  Service: Endoscopy;  Laterality: N/A;  . EYE SURGERY Bilateral    remove cataracts  . GASTRECTOMY N/A 01/15/2016   Procedure: OPEN PARTIAL GASTRECTOMY;  Surgeon: Stark Klein, MD;  Location: [place];  Service: General;  Laterality: N/A;  . GASTRECTOMY Left 02/24/2016   Procedure: OPEN J TUBE;  Surgeon: Stark Klein, MD;  Location: [place];  Service: General;  Laterality: Left;  [name] GASTROJEJUNOSTOMY N/A 05/18/2016   Procedure: ROUX EN [place];  Surgeon: Stark Klein, MD;  Location: [place];  Service: General;  Laterality: N/A;  . I & D KNEE WITH POLY EXCHANGE Right 12/17/2013   Procedure: IRRIGATION AND DEBRIDEMEN RIGHT TOTAL  KNEE WITH POLY EXCHANGE;   Surgeon: Mauri Pole, MD;  Location: [facility]S;  Service: Orthopedics;  Laterality: Right;  . IR GENERIC HISTORICAL  06/17/2016   IR RADIOLOGIST EVAL & MGMT 06/17/2016 Ascencion Dike, PA-C [facility] INTERV RAD  . LAPAROSCOPY N/A 01/15/2016   Procedure: LAPAROSCOPY DIAGNOSTIC;  Surgeon: Stark Klein, MD;  Location: [place];  Service: General;  Laterality: N/A;  . LYSIS OF ADHESION  05/18/2016   Procedure: LYSIS OF ADHESION;  Surgeon: Stark Klein, MD;  Location: [place];  Service: General;;  . NEPHRECTOMY Left    renal cell cancer  . PACEMAKER INSERTION  07/04/2011   MDT PPM implanted by Dr Doreatha Lew for tachy/brady; managed by Dr Thompson Grayer   . PATELLAR TENDON REPAIR Right 03/06/2014   Procedure: AVULSION WITH PATELLA TENDON REPAIR;  Surgeon: Mauri Pole, MD;  [facility];  Service: Orthopedics;  Laterality: Right;  . PATENT DUCTUS ARTERIOUS REPAIR  ~ 1949  . RIGHT/LEFT HEART CATH AND CORONARY ANGIOGRAPHY N/A 10/09/2019   Procedure: RIGHT/LEFT HEART CATH AND CORONARY ANGIOGRAPHY;  Surgeon: [name], Kuron Docken M, MD;  Location: [place] CV LAB;  Service: Cardiovascular;  Laterality: N/A;  . TEE WITHOUT CARDIOVERSION N/A 10/30/2019   Procedure: TRANSESOPHAGEAL ECHOCARDIOGRAM (TEE);  Surgeon: Burnell Blanks, MD;  Location: [place];  Service: Open Heart Surgery;  Laterality: N/A;  . TEE WITHOUT CARDIOVERSION N/A 06/13/2020   Procedure: TRANSESOPHAGEAL ECHOCARDIOGRAM (TEE);  Surgeon: Buford Dresser, MD;  Location[facility] ENDOSCOPY;  Service: Cardiovascular;  Laterality: N/A;  . TOTAL ABDOMINAL HYSTERECTOMY  85  . TOTAL HIP REVISION Right 2009   initial replacment surg  . TOTAL KNEE ARTHROPLASTY  02/07/2012   Procedure: TOTAL KNEE ARTHROPLASTY;  Surgeon: Mauri Pole, MD;  [facility];  Service: Orthopedics;  Laterality: Right;  . TOTAL KNEE REVISION Right 07/29/2014   Procedure: RIGHT KNEE REVISION OF PREVIOUS REPAIR EXTENSOR MECHANISM;  Surgeon: Mauri Pole, MD;  [facility];   Service: Orthopedics;  Laterality: Right;  . TRANSCATHETER AORTIC VALVE REPLACEMENT, CAROTID Left 10/30/2019   TRANSCATHETER AORTIC VALVE REPLACEMENT, LEFT CAROTID (Left Neck)   . TRANSCATHETER AORTIC VALVE REPLACEMENT, CAROTID Left 10/30/2019   Procedure: TRANSCATHETER AORTIC VALVE REPLACEMENT, LEFT CAROTID;  Surgeon: Burnell Blanks, MD;  Location: [place];  Service: Open Heart Surgery;  Laterality: Left;  . TRANSURETHRAL RESECTION OF BLADDER TUMOR N/A 07/21/2018   Procedure: TRANSURETHRAL RESECTION OF BLADDER TUMOR (TURBT);  Surgeon: Alexis Frock, MD;  [facility];  Service: Urology;  Laterality: N/A;  . TRANSURETHRAL RESECTION OF BLADDER TUMOR WITH MITOMYCIN-C N/A 05/01/2018  Procedure: TRANSURETHRAL RESECTION OF BLADDER TUMOR WITH POST OP INSTILLATION OF GEMCITABINE;  Surgeon: Lucas Mallow, MD;  [facility];  Service: Urology;  Laterality: N/A;  . WHIPPLE PROCEDURE N/A 05/18/2016   Procedure: EXPLORATORY LAPAROTOMY;  Surgeon: Stark Klein, MD;  Location: [place] OR;  Service: General;  Laterality: N/A;     Current Outpatient Medications  Medication Sig Dispense Refill  . acetaminophen (TYLENOL) 500 MG tablet Take 500 mg by mouth every 8 (eight) hours as needed for moderate pain.    [name] ALPRAZolam (XANAX) 0.25 MG tablet Take 0.5-1 tablets (0.125-0.25 mg total) by mouth 2 (two) times daily as needed for anxiety. 30 tablet 0  . Alum & Mag Hydroxide-Simeth (MYLANTA PO) Take 20 mLs by mouth every evening.     [name] amoxicillin (AMOXIL) 500 MG tablet Take 4 tablets (2,000 mg total) by mouth as directed. 1 hour prior to dental work including cleanings 12 tablet 12  . Ascorbic Acid (VITAMIN C) 1000 MG tablet Take 1,000 mg by mouth daily.    . Cholecalciferol (VITAMIN D-3) 5000 units TABS Take 5,000 Units by mouth 3 (three) times daily.     . Cyanocobalamin 5000 MCG SUBL Place 5,000 mcg under the tongue daily.     [name] erythromycin ophthalmic ointment Place 1 application into both eyes at  bedtime as needed (stye).     . famotidine (PEPCID) 40 MG tablet TAKE 1 TABLET DAILY FOR INDIGESTION & HEARTBURN (Patient taking differently: Take 40 mg by mouth daily. Take 1 tablet Daily for Indigestion & Heartburn) 90 tablet 3  . ferrous sulfate 325 (65 FE) MG tablet Take 325 mg by mouth daily in the afternoon.    . furosemide (LASIX) 20 MG tablet Take 1 tablet (20 mg total) by mouth daily. 90 tablet 3  . gabapentin (NEURONTIN) 300 MG capsule TAKE 1 CAPSULE BY MOUTH THREE TIMES A DAY (Patient taking differently: Take 300 mg by mouth 2 (two) times daily.) 270 capsule 1  . hydrocortisone (ANUSOL-HC) 2.5 % rectal cream Place rectally 2 (two) times daily. (Patient taking differently: Place 1 application rectally 2 (two) times daily as needed for hemorrhoids.) 30 g 1  . levothyroxine (SYNTHROID) 100 MCG tablet TAKE 1/2 TABLET DAILY FOR THYROID. (Patient taking differently: Take 50 mcg by mouth daily before breakfast. TAKE 1/2 TABLET DAILY FOR THYROID.) 45 tablet 1  . loperamide (IMODIUM A-D) 2 MG tablet Take up to 12 tablets /day as needed for  for Diarrhea (Patient taking differently: Take 2 mg by mouth at bedtime.) 120 tablet 11  . loratadine (CLARITIN) 10 MG tablet Take 10 mg by mouth at bedtime.    [name] losartan (COZAAR) 25 MG tablet Take 1 tablet (25 mg total) by mouth daily. (Patient taking differently: Take 25 mg by mouth in the morning and at bedtime.) 30 tablet 11  . metoprolol succinate (TOPROL-XL) 50 MG 24 hr tablet Take 1 tablet (50 mg total) by mouth in the morning and at bedtime. 180 tablet 2  . nitroGLYCERIN (NITROSTAT) 0.4 MG SL tablet Dissolve 1 tablet under tongue every 5 minutes if needed for Angina (Patient taking differently: Place 0.4 mg under the tongue every 5 (five) minutes as needed for chest pain. Dissolve 1 tablet under tongue every 5 minutes if needed for Angina) 50 tablet prn  . ondansetron (ZOFRAN-ODT) 8 MG disintegrating tablet Dissolve 1 tablet under tongue every 6 to 8 hours   3 x /day if needed for Nausea or Vomitting (Patient taking differently: Take  8 mg by mouth every 6 (six) hours as needed for vomiting or nausea. Dissolve 1 tablet under tongue every 6 to 8 hours  3 x /day if needed for Nausea or Vomitting) 30 tablet 3  . potassium chloride (KLOR-CON) 10 MEQ tablet Take 10 mEq by mouth daily.    [name] Propylene Glycol (SYSTANE COMPLETE) 0.6 % SOLN Place 1 drop into both eyes 3 (three) times daily as needed (irritation/dry eyes.).    [name] simethicone (MYLICON) 80 MG chewable tablet Chew 80 mg by mouth every 6 (six) hours as needed for flatulence.    . sucralfate (CARAFATE) 1 g tablet Take 1 g by mouth in the morning, at noon, and at bedtime.    [name] tobramycin-dexamethasone (TOBRADEX) ophthalmic solution Place 1 drop into both eyes daily as needed (stye).     [name] triamcinolone cream (KENALOG) 0.1 % Apply 1 application topically 2 (two) times daily as needed (rash/skin irritation.).     [name] verapamil (CALAN-SR) 120 MG CR tablet TAKE 1 TABLET 2 X /DAY WITH A MEAL FOR BP & HEART RHYTHM (Patient taking differently: Take 120 mg by mouth 2 (two) times daily. Take 1 tablet 2 x /day with a meal for BP & Heart Rhythm) 180 tablet 3  . warfarin (COUMADIN) 2 MG tablet Take      as directed       to Prevent Blood Clots (Patient taking differently: Take 1-2 mg by mouth See admin instructions. 1 mg on Sunday and 2 mg all other days) 180 tablet 0  . pravastatin (PRAVACHOL) 20 MG tablet Take 1 tablet (20 mg total) by mouth daily. 90 tablet 1   Current Facility-Administered Medications  Medication Dose Route Frequency Provider Last Rate Last Admin  . sodium chloride flush (NS) 0.9 % injection 3 mL  3 mL Intravenous Q12H Lorretta Harp, MD        Allergies:   Amiodarone, Adhesive [tape], Hydrocodone, Other, Reglan [metoclopramide], Remeron [mirtazapine], Codeine, Morphine and related, Requip [ropinirole hcl], and Zinc    Social History:  The patient  reports that she has never smoked. She has  never used smokeless tobacco. She reports that she does not drink alcohol and does not use drugs.   Family History:  The patient's family history includes Colon cancer (age of onset: 45) in her mother; Heart attack in her father; Heart disease in her father, maternal grandmother, and mother; Stomach cancer in her brother.    ROS:  Please see the history of present illness.   Otherwise, review of systems are positive for none.   All other systems are reviewed and negative.    PHYSICAL EXAM: VS:  BP 138/68 (BP Location: Left Arm, Patient Position: Sitting, Cuff Size: Normal)   Pulse 84   Ht 4\' 11"  (1.499 m)   Wt 95 lb (43.1 kg)   LMP  (LMP Unknown)   BMI 19.19 kg/m  , BMI Body mass index is 19.19 kg/m. GEN: Well nourished, elderly WF, in no acute distress. Ambulating with a walker.  HEENT: sclera anicteric Neck: no JVD, carotid bruits, or masses Cardiac: RRR; gr 1/6 diastolic murmur LSB. Respiratory:  clear to auscultation bilaterally, normal work of breathing GI: soft, nontender, nondistended, + BS MS: no deformity or atrophy. Pedal pulse 2+ on the left and absent on the right. Skin: warm and dry, no rash, no edema in feet.  Neuro:  Strength and sensation are intact Psych: euthymic mood, full affect   EKG:  EKG is not  ordered today. Ecg done 11/07/20 showed AV pacing (in NSR). I have personally reviewed and interpreted this study.     Recent Labs: 08/18/2020: BUN 18; Creat 0.89; Hemoglobin 12.7; Magnesium 1.8; Platelets 149; Potassium 4.0; Sodium 143; TSH 1.27 09/09/2020: ALT 16    Lipid Panel    Component Value Date/Time   CHOL 131 08/18/2020 1117   TRIG 107 08/18/2020 1117   HDL 41 (L) 08/18/2020 1117   CHOLHDL 3.2 08/18/2020 1117   VLDL 29 03/23/2017 1445   LDLCALC 71 08/18/2020 1117      Wt Readings from Last 3 Encounters:  11/17/20 95 lb (43.1 kg)  11/07/20 95 lb 12.8 oz (43.5 kg)  10/06/20 96 lb 12.8 oz (43.9 kg)      Other studies Reviewed: Additional  studies/ records that were reviewed today include:   NST 2017:   The left ventricular ejection fraction is normal (55-65%).  Nuclear stress EF: 62%.  The study is normal.  Normal stress nuclear study with no ischemia or infarction; EF 62 with normal wall motion; note patient in atrial flutter at time of study.  Echocardiogram 09/17/19: 1. Left ventricular ejection fraction, by visual estimation, is 60 to 65%. The left ventricle has normal function. There is moderately increased left ventricular hypertrophy.  2. Left ventricular diastolic function could not be evaluated.  3. Small left ventricular internal cavity size.  4. The left ventricle has no regional wall motion abnormalities.  5. Global right ventricle has normal systolic function.The right ventricular size is normal. No increase in right ventricular wall thickness.  6. Left atrial size was moderately dilated.  7. The mitral valve is degenerative. Mild mitral valve regurgitation.  8. The tricuspid valve is grossly normal.  9. The aortic valve is severely calcified with restricted leaflet motion. Vmax 3.3 m/s, mean gradient 25 mmHG, DI 0.21, AVA 0.60 cm2. SV=43 cc and SVi=31 cc/m2. Suspect there is paradoxical low-flow low-gradient severe aortic stenosis. Moderate aortic  regurgitation is present. Recommend an aortic valve calcium score for clarification of AoV stenosis severity. 10. Aortic valve regurgitation is moderate. 11. The aortic valve is tricuspid. Aortic valve regurgitation is moderate. Moderate to severe aortic valve stenosis. 12. Mildly elevated pulmonary artery systolic pressure. 13. The tricuspid regurgitant velocity is 2.68 m/s, and with an assumed right atrial pressure of 15 mmHg, the estimated right ventricular systolic pressure is mildly elevated at 43.8 mmHg. 14. A pacer wire is visualized in the RA and RV. 15. The inferior vena cava is dilated in size with <50% respiratory variability, suggesting right atrial  pressure of 15 mmHg.  TAVR OPERATIVE NOTE   Date of Procedure:10/30/2019  Preoperative Diagnosis:Severe Aortic Stenosis   Postoperative Diagnosis:Same   Procedure:   Transcatheter Aortic Valve Replacement - OpenLeftTranscarotid Approach Edwards Sapien 3 THV (size 82mm, model # 9600TFX, serial # [id number])  Co-Surgeons:Clarence H. Roxy Manns, MD and [name], MD  Anesthesiologist:Chris Ermalene Postin, MD  Dala Dock, MD  Pre-operative Echo Findings: ? Paradoxical Low Flow with Normal EFSevere aortic stenosis ? Normalleft ventricular systolic function  Post-operative Echo Findings: ? Mildparavalvular leak ? Normalleft ventricular systolic function  _____________  Echo2/24/21: IMPRESSIONS 1. Left ventricular ejection fraction, by estimation, is 70 to 75%. The  left ventricle has hyperdynamic function. The left ventricle has no  regional wall motion abnormalities. There is moderate concentric left  ventricular hypertrophy. Left ventricular  diastolic function could not be evaluated.  2. Right ventricular systolic function is normal. The right ventricular  size is normal. There is  moderately elevated pulmonary artery systolic  pressure. The estimated right ventricular systolic pressure is 76.7 mmHg.  3. Left atrial size was mildly dilated.  4. Right atrial size was mildly dilated.  5. Large pleural effusion in the left lateral region.  6. The mitral valve is normal in structure and function. Moderate mitral  valve regurgitation. No evidence of mitral stenosis.  7. There is mild paravalvular leak (at 5'clock on short axis), most  prominent on images 82, 83 where it appears mild to moderate.   Normal peak/mean transaortic gradients 15/7 mmHg.. The aortic valve  has been repaired/replaced. Aortic valve regurgitation is  not visualized.  No aortic stenosis is present. Echo findings are consistent with  perivalvular leak of the aortic prosthesis.  Aortic valve mean gradient measures 7.0 mmHg.  8. The inferior vena cava is normal in size with greater than 50%  respiratory variability, suggesting right atrial pressure of 3 mmHg.   ___________________  Limited echo 11/05/19: IMPRESSIONS 1. Left ventricular ejection fraction, by estimation, is 60 to 65%. The  left ventricle has normal function. The left ventricle has no regional  wall motion abnormalities. There is moderate left ventricular hypertrophy.  Left ventricular diastolic  parameters are indeterminate.  2. Moderate pleural effusion in the left lateral region.  3. The mitral valve is degenerative. Moderate mitral annular  calcifications. Moderate mitral valve regurgitation.  4. Aortic dilatation noted. There is dilatation of the ascending aorta  measuring 40 mm.  5. Tricuspid valve regurgitation is mild to moderate.  6. Right ventricular systolic function is mildly reduced. The right  ventricular size is normal. There is moderately elevated pulmonary artery  systolic pressure. The estimated right ventricular systolic pressure is  34.1 mmHg.  7. The inferior vena cava is normal in size with <50% respiratory  variability, suggesting right atrial pressure of 8 mmHg.  8. There is a 23 mm Edwards Sapien prosthetic (TAVR) valve present in the  aortic position. Procedure Date: 10/30/19. Mean gradient 66mmHg. Echo  findings are consistent with perivalvular leak of the aortic prosthesis.  Paravalvular leak appears  mild-moderate, unchanged from prior TTE on 2/24.   Comparison(s): 10/31/19 EF 70-75%. PA 29mmHg. AV 33mmHg mean, 74mmhg peak. Mild-moderate perivalvular AI.   ___________________  Echo 11/22/19 IMPRESSIONS  1. Left ventricular ejection fraction, by estimation, is 55 to 60%. The  left ventricle has normal function. The left  ventricle has no regional  wall motion abnormalities. There is mild concentric left ventricular  hypertrophy. Left ventricular diastolic  parameters are indeterminate. Elevated left ventricular end-diastolic  pressure.  2. Right ventricular systolic function is normal. The right ventricular  size is normal. There is mildly elevated pulmonary artery systolic  pressure.  3. Left atrial size was severely dilated.  4. Right atrial size was severely dilated.  5. The mitral valve is normal in structure. Mild mitral valve  regurgitation. No evidence of mitral stenosis.  6. Mild to moderate perivalvular aortic regurgitation unchanged from  prior. The aortic valve has been repaired/replaced. Aortic valve  regurgitation is mild to moderate. No aortic stenosis is present. There is  a 23 mm Edwards Sapien prosthetic (TAVR)  valve present in the aortic position. Procedure Date: 10/30/19. Aortic  regurgitation PHT measures 358 msec. Aortic valve mean gradient measures  10.0 mmHg. Aortic valve Vmax measures 2.11 m/s.  7. Aortic dilatation noted. There is mild dilatation of the ascending  aorta.  8. The inferior vena cava is normal in size with greater than 50%  respiratory variability, suggesting right atrial pressure of 3 mmHg.   Echo 10/29/20: 1. Left ventricular ejection fraction, by estimation, is 60 to 65%. The left ventricle has normal function. The left ventricle has no regional wall motion abnormalities. There is mild concentric hypertrophy and mild-to-moderate hypertrophy of the basal septal segment. Left ventricular diastolic parameters are consistent with Grade II diastolic dysfunction (pseudonormalization). Elevated left atrial pressure. 2. Right ventricular systolic function is mildly reduced. The right ventricular size is mildly enlarged. There is normal pulmonary artery systolic pressure. 3. Left atrial size was severely dilated. 4. Right atrial size was severely dilated. 5.  The mitral valve is degenerative. Trivial mitral valve regurgitation. Moderate mitral annular calcification. 6. The aortic valve has been repaired/replaced. A 7mm Edwards Sapien TAVR valve is present. The valve appears well seated with normal function by doppler interrogation. Mean gradient 35mmHg, peak gradient 69mmHg, Vmax 2.4cm/s, DI 0.47. There is mild-to-moderate paravalvular leak most notable in the 5 o'clock position on aortic short axis view. 7. There is mild to moderate dilatation of the ascending aorta, measuring 39 mm. 8. The inferior vena cava is normal in size with greater than 50% respiratory variability, suggesting right atrial pressure of 3 mmHg. Comparison(s): No significant change from prior study.   ASSESSMENT AND PLAN:   1. Chronic diastolic CHF: secondary to combined Afib, hypertensive heart disease and AS. Weight is stable.  She has no edema on exam. Clinically she is class 2-3.  Recommend continuing on lasix 20 mg daily.  Recent Echo showed normal LV function.    2. Paroxysmal atrial fibrillation:  S/p DCCV in March 2021. Recurrent in September 2021. Now s/p TEE guided DCCV on 06/13/20 - recurrent by pacer check in January.  - Continue coumadin for stroke ppx. [place] Vasc score of 7.  - Continue metoprolol  75 mg BID - Continue verapamil at current dose - recommend rate control strategy per EP. Was in NSR on March 4.  - will need bridging Lovenox for planned PV procedure.   3. Aortic stenosis. S/p TAVR in February 2021:  She has mild to moderate perivalvular lead/AI. -Unchanged by recent Echo.  - on Coumadin due to Afib. No antiplatelet therapy due to needing to be on Coumadin   4. HTN: BP is well controlled.  - Continue metoprolol,  and verapamil  - continue losartan. Renal function OK  5. Tachy-brady syndrome s/p PPM: device interrogation with normal function; Follow up in Afib   6. Carotid artery disease: mild stenosis noted on carotid dopplers  08/15/2019 - Recommend annual follow-up doppler studies  7. Thoracic aortic aneurysm. 4.0 cm ectatic aorta.   8. PAD pre TAVR CT showed severe aortic and ileofemoral atherosclerosis with multifocal stenosis in the right external iliac. She does have chronic claudication symptoms in her right > left leg. No critical limb ischemia.  Evaluated by Dr Gwenlyn Found and angiography is planned.  Intervention for her PAD May require antiplatelet therapy and therefore bleeding risk would be high.   I will follow up in 4 months.   Signed, Elesha Thedford [name], MD  11/17/2020 10:56 AM

## 2020-11-17 ENCOUNTER — Ambulatory Visit (INDEPENDENT_AMBULATORY_CARE_PROVIDER_SITE_OTHER): Payer: Medicare Other

## 2020-11-17 ENCOUNTER — Other Ambulatory Visit: Payer: Self-pay

## 2020-11-17 ENCOUNTER — Ambulatory Visit: Payer: Medicare Other | Admitting: Cardiology

## 2020-11-17 VITALS — BP 138/68 | HR 84 | Ht 59.0 in | Wt 95.0 lb

## 2020-11-17 DIAGNOSIS — I10 Essential (primary) hypertension: Secondary | ICD-10-CM

## 2020-11-17 DIAGNOSIS — Z952 Presence of prosthetic heart valve: Secondary | ICD-10-CM

## 2020-11-17 DIAGNOSIS — I480 Paroxysmal atrial fibrillation: Secondary | ICD-10-CM | POA: Diagnosis not present

## 2020-11-17 DIAGNOSIS — I495 Sick sinus syndrome: Secondary | ICD-10-CM | POA: Diagnosis not present

## 2020-11-17 DIAGNOSIS — Z7901 Long term (current) use of anticoagulants: Secondary | ICD-10-CM

## 2020-11-17 DIAGNOSIS — I4891 Unspecified atrial fibrillation: Secondary | ICD-10-CM | POA: Diagnosis not present

## 2020-11-17 DIAGNOSIS — I739 Peripheral vascular disease, unspecified: Secondary | ICD-10-CM | POA: Diagnosis not present

## 2020-11-17 LAB — POCT INR: INR: 1.7 — AB (ref 2.0–3.0)

## 2020-11-17 MED ORDER — PRAVASTATIN SODIUM 20 MG PO TABS: 20.0000 mg | ORAL_TABLET | Freq: Every day | ORAL | 1 refills | Status: AC

## 2020-11-17 MED ORDER — ENOXAPARIN SODIUM 40 MG/0.4ML ~~LOC~~ SOLN: 40.0000 mg | Freq: Two times a day (BID) | SUBCUTANEOUS | 1 refills | Status: DC

## 2020-11-17 NOTE — Patient Instructions (Signed)
Resume pravastatin 20 mg daily for cholesterol  Proceed with angiogram with Dr Gwenlyn Found. Pharmacy will bridge our coumadin

## 2020-11-20 ENCOUNTER — Telehealth: Payer: Self-pay

## 2020-11-20 ENCOUNTER — Other Ambulatory Visit: Payer: Self-pay | Admitting: Adult Health

## 2020-11-20 DIAGNOSIS — C672 Malignant neoplasm of lateral wall of bladder: Secondary | ICD-10-CM | POA: Diagnosis not present

## 2020-11-20 MED ORDER — LOSARTAN POTASSIUM 50 MG PO TABS: 25.0000 mg | ORAL_TABLET | Freq: Two times a day (BID) | ORAL | 3 refills | Status: DC

## 2020-11-20 NOTE — Telephone Encounter (Signed)
Pharmacy is giving the patient a hard time with filling Losartan because the script hasn't been updated with the correct instructions, Take 25mg , one tablet BID. Please advise.  Current prescription states 25mg  once daily.

## 2020-11-25 ENCOUNTER — Other Ambulatory Visit (HOSPITAL_COMMUNITY)
Admission: RE | Admit: 2020-11-25 | Discharge: 2020-11-25 | Disposition: A | Discharge status: Home / Self Care | Payer: Medicare Other | Source: Ambulatory Visit | Attending: Cardiovascular Disease | Admitting: Cardiovascular Disease

## 2020-11-25 ENCOUNTER — Ambulatory Visit: Payer: Medicare Other | Admitting: Dermatology

## 2020-11-25 ENCOUNTER — Telehealth: Payer: Self-pay

## 2020-11-25 DIAGNOSIS — I4891 Unspecified atrial fibrillation: Secondary | ICD-10-CM | POA: Diagnosis not present

## 2020-11-25 DIAGNOSIS — Z01812 Encounter for preprocedural laboratory examination: Secondary | ICD-10-CM | POA: Insufficient documentation

## 2020-11-25 DIAGNOSIS — I10 Essential (primary) hypertension: Secondary | ICD-10-CM | POA: Diagnosis not present

## 2020-11-25 DIAGNOSIS — I739 Peripheral vascular disease, unspecified: Secondary | ICD-10-CM | POA: Diagnosis not present

## 2020-11-25 DIAGNOSIS — Z20822 Contact with and (suspected) exposure to covid-19: Secondary | ICD-10-CM | POA: Insufficient documentation

## 2020-11-25 LAB — CBC
Hematocrit: 39.9 % (ref 34.0–46.6)
Hemoglobin: 13.2 g/dL (ref 11.1–15.9)
MCH: 31.5 pg (ref 26.6–33.0)
MCHC: 33.1 g/dL (ref 31.5–35.7)
MCV: 95 fL (ref 79–97)
Platelets: 175 10*3/uL (ref 150–450)
RBC: 4.19 x10E6/uL (ref 3.77–5.28)
RDW: 13.1 % (ref 11.7–15.4)
WBC: 6.1 10*3/uL (ref 3.4–10.8)

## 2020-11-25 LAB — BASIC METABOLIC PANEL
BUN/Creatinine Ratio: 13 (ref 12–28)
BUN: 13 mg/dL (ref 8–27)
CO2: 21 mmol/L (ref 20–29)
Calcium: 9.2 mg/dL (ref 8.7–10.3)
Chloride: 107 mmol/L — ABNORMAL HIGH (ref 96–106)
Creatinine, Ser: 0.98 mg/dL (ref 0.57–1.00)
Glucose: 76 mg/dL (ref 65–99)
Potassium: 3.8 mmol/L (ref 3.5–5.2)
Sodium: 145 mmol/L — ABNORMAL HIGH (ref 134–144)
eGFR: 57 mL/min/{1.73_m2} — ABNORMAL LOW (ref >59)

## 2020-11-25 LAB — SARS CORONAVIRUS 2 (TAT 6-24 HRS): SARS Coronavirus 2: NEGATIVE

## 2020-11-25 NOTE — Telephone Encounter (Signed)
Spoke with pt's daughter on the phone. Pt to get labs and covid swab today.

## 2020-11-26 ENCOUNTER — Ambulatory Visit: Payer: Medicare Other | Admitting: Adult Health

## 2020-11-26 ENCOUNTER — Telehealth: Payer: Self-pay | Admitting: *Deleted

## 2020-11-26 NOTE — Telephone Encounter (Signed)
Pt contacted pre-abdominal aortogram  scheduled at [facility] for: Thursday November 27, 2020 7:30 AM Verified arrival time and place: [facility]) at: 5:30 AM   No solid food after midnight prior to cath, clear liquids until 5 AM day of procedure.  Hold: Coumadin-none 11/21/20 until post procedure/Lovenox bridge Lasix/KCl-AM of procedure  Except hold medications AM meds can be  taken pre-cath with sips of water including: ASA 81 mg   Confirmed patient has responsible adult to drive home post procedure and be with patient first 24 hours after arriving home: yes  You are allowed ONE visitor in the waiting room during the time you are at the hospital for your procedure. Both you and your visitor must wear a mask once you enter the hospital.  Reviewed procedure/mask/visitor instructions with patient's daughter (DPR), Beverlee Nims.

## 2020-11-27 ENCOUNTER — Encounter (HOSPITAL_COMMUNITY): Payer: Self-pay | Admitting: Cardiovascular Disease

## 2020-11-27 ENCOUNTER — Observation Stay (HOSPITAL_COMMUNITY)
Admission: RE | Admit: 2020-11-27 | Discharge: 2020-11-28 | Disposition: A | Discharge status: Home / Self Care | Payer: Medicare Other | Attending: Cardiovascular Disease | Admitting: Cardiovascular Disease

## 2020-11-27 ENCOUNTER — Other Ambulatory Visit: Payer: Self-pay

## 2020-11-27 ENCOUNTER — Encounter (HOSPITAL_COMMUNITY)
Admission: RE | Disposition: A | Discharge status: Home / Self Care | Payer: Self-pay | Source: Home / Self Care | Attending: Cardiovascular Disease

## 2020-11-27 DIAGNOSIS — N183 Chronic kidney disease, stage 3 unspecified: Secondary | ICD-10-CM | POA: Insufficient documentation

## 2020-11-27 DIAGNOSIS — I10 Essential (primary) hypertension: Secondary | ICD-10-CM | POA: Diagnosis present

## 2020-11-27 DIAGNOSIS — I498 Other specified cardiac arrhythmias: Secondary | ICD-10-CM | POA: Diagnosis not present

## 2020-11-27 DIAGNOSIS — I495 Sick sinus syndrome: Secondary | ICD-10-CM | POA: Insufficient documentation

## 2020-11-27 DIAGNOSIS — Z952 Presence of prosthetic heart valve: Secondary | ICD-10-CM | POA: Diagnosis not present

## 2020-11-27 DIAGNOSIS — D649 Anemia, unspecified: Secondary | ICD-10-CM | POA: Diagnosis not present

## 2020-11-27 DIAGNOSIS — Z531 Procedure and treatment not carried out because of patient's decision for reasons of belief and group pressure: Secondary | ICD-10-CM

## 2020-11-27 DIAGNOSIS — K922 Gastrointestinal hemorrhage, unspecified: Secondary | ICD-10-CM

## 2020-11-27 DIAGNOSIS — I70211 Atherosclerosis of native arteries of extremities with intermittent claudication, right leg: Secondary | ICD-10-CM | POA: Diagnosis not present

## 2020-11-27 DIAGNOSIS — Z888 Allergy status to other drugs, medicaments and biological substances status: Secondary | ICD-10-CM | POA: Diagnosis not present

## 2020-11-27 DIAGNOSIS — Z79890 Hormone replacement therapy (postmenopausal): Secondary | ICD-10-CM | POA: Diagnosis not present

## 2020-11-27 DIAGNOSIS — C169 Malignant neoplasm of stomach, unspecified: Secondary | ICD-10-CM | POA: Diagnosis present

## 2020-11-27 DIAGNOSIS — Z885 Allergy status to narcotic agent status: Secondary | ICD-10-CM | POA: Diagnosis not present

## 2020-11-27 DIAGNOSIS — I480 Paroxysmal atrial fibrillation: Secondary | ICD-10-CM | POA: Diagnosis not present

## 2020-11-27 DIAGNOSIS — Z90710 Acquired absence of both cervix and uterus: Secondary | ICD-10-CM | POA: Insufficient documentation

## 2020-11-27 DIAGNOSIS — I130 Hypertensive heart and chronic kidney disease with heart failure and stage 1 through stage 4 chronic kidney disease, or unspecified chronic kidney disease: Secondary | ICD-10-CM | POA: Diagnosis not present

## 2020-11-27 DIAGNOSIS — Z7901 Long term (current) use of anticoagulants: Secondary | ICD-10-CM | POA: Insufficient documentation

## 2020-11-27 DIAGNOSIS — Z79899 Other long term (current) drug therapy: Secondary | ICD-10-CM | POA: Diagnosis not present

## 2020-11-27 DIAGNOSIS — I5032 Chronic diastolic (congestive) heart failure: Secondary | ICD-10-CM | POA: Diagnosis not present

## 2020-11-27 DIAGNOSIS — I739 Peripheral vascular disease, unspecified: Secondary | ICD-10-CM

## 2020-11-27 DIAGNOSIS — Z950 Presence of cardiac pacemaker: Secondary | ICD-10-CM | POA: Diagnosis not present

## 2020-11-27 DIAGNOSIS — I4891 Unspecified atrial fibrillation: Secondary | ICD-10-CM | POA: Diagnosis present

## 2020-11-27 HISTORY — PX: ABDOMINAL AORTOGRAM W/LOWER EXTREMITY: CATH118223

## 2020-11-27 HISTORY — PX: PERIPHERAL VASCULAR ATHERECTOMY: CATH118256

## 2020-11-27 HISTORY — PX: PERIPHERAL VASCULAR INTERVENTION: CATH118257

## 2020-11-27 LAB — POCT ACTIVATED CLOTTING TIME
Activated Clotting Time: 237 seconds
Activated Clotting Time: 261 seconds
Activated Clotting Time: 327 seconds
Activated Clotting Time: 225 seconds
Activated Clotting Time: 178 seconds

## 2020-11-27 LAB — NO BLOOD PRODUCTS

## 2020-11-27 LAB — PROTIME-INR
INR: 1.1 (ref 0.8–1.2)
Prothrombin Time: 14.1 seconds (ref 11.4–15.2)

## 2020-11-27 SURGERY — ABDOMINAL AORTOGRAM W/LOWER EXTREMITY
Anesthesia: LOCAL | Laterality: Right

## 2020-11-27 MED ORDER — VERAPAMIL HCL 2.5 MG/ML IV SOLN: INTRA_ARTERIAL | Status: DC | PRN

## 2020-11-27 MED ORDER — FERROUS SULFATE 325 (65 FE) MG PO TABS
325.0000 mg | ORAL_TABLET | Freq: Every day | ORAL | Status: DC
  Administered 2020-11-27: 325 mg via ORAL
  Filled 2020-11-27: qty 1

## 2020-11-27 MED ORDER — FENTANYL CITRATE (PF) 100 MCG/2ML IJ SOLN
INTRAMUSCULAR | Status: AC
  Filled 2020-11-27: qty 2

## 2020-11-27 MED ORDER — VERAPAMIL HCL ER 120 MG PO TBCR
120.0000 mg | EXTENDED_RELEASE_TABLET | Freq: Two times a day (BID) | ORAL | Status: DC
  Administered 2020-11-27 – 2020-11-28 (×2): 120 mg via ORAL
  Filled 2020-11-27 (×3): qty 1

## 2020-11-27 MED ORDER — MIDAZOLAM HCL 2 MG/2ML IJ SOLN
INTRAMUSCULAR | Status: AC
  Filled 2020-11-27: qty 2

## 2020-11-27 MED ORDER — FAMOTIDINE 20 MG PO TABS
40.0000 mg | ORAL_TABLET | Freq: Every day | ORAL | Status: DC
  Administered 2020-11-28: 40 mg via ORAL
  Filled 2020-11-27: qty 2

## 2020-11-27 MED ORDER — NITROGLYCERIN 1 MG/10 ML FOR IR/CATH LAB
INTRA_ARTERIAL | Status: DC | PRN
  Administered 2020-11-27: 200 ug

## 2020-11-27 MED ORDER — HEPARIN (PORCINE) IN NACL 1000-0.9 UT/500ML-% IV SOLN
INTRAVENOUS | Status: DC | PRN
  Administered 2020-11-27 (×2): 500 mL

## 2020-11-27 MED ORDER — HEPARIN SODIUM (PORCINE) 1000 UNIT/ML IJ SOLN
INTRAMUSCULAR | Status: DC | PRN
  Administered 2020-11-27 (×2): 2000 [IU] via INTRAVENOUS
  Administered 2020-11-27: 5000 [IU] via INTRAVENOUS

## 2020-11-27 MED ORDER — TOBRAMYCIN-DEXAMETHASONE 0.3-0.1 % OP SUSP
1.0000 [drp] | Freq: Every day | OPHTHALMIC | Status: DC | PRN
  Filled 2020-11-27: qty 2.5

## 2020-11-27 MED ORDER — HYDRALAZINE HCL 20 MG/ML IJ SOLN
INTRAMUSCULAR | Status: AC
  Filled 2020-11-27: qty 1

## 2020-11-27 MED ORDER — HEPARIN (PORCINE) IN NACL 1000-0.9 UT/500ML-% IV SOLN
INTRAVENOUS | Status: AC
  Filled 2020-11-27: qty 1000

## 2020-11-27 MED ORDER — LABETALOL HCL 5 MG/ML IV SOLN
INTRAVENOUS | Status: AC
  Filled 2020-11-27: qty 4

## 2020-11-27 MED ORDER — SODIUM CHLORIDE 0.9 % IV SOLN
250.0000 mL | INTRAVENOUS | Status: DC | PRN
  Filled 2020-11-27: qty 250

## 2020-11-27 MED ORDER — FENTANYL CITRATE (PF) 100 MCG/2ML IJ SOLN
INTRAMUSCULAR | Status: DC | PRN
  Administered 2020-11-27: 25 ug via INTRAVENOUS

## 2020-11-27 MED ORDER — ACETAMINOPHEN 500 MG PO TABS: 500.0000 mg | ORAL_TABLET | Freq: Three times a day (TID) | ORAL | Status: DC | PRN

## 2020-11-27 MED ORDER — SODIUM CHLORIDE 0.9% FLUSH: 3.0000 mL | Freq: Two times a day (BID) | INTRAVENOUS | Status: DC

## 2020-11-27 MED ORDER — LOPERAMIDE HCL 2 MG PO CAPS
2.0000 mg | ORAL_CAPSULE | ORAL | Status: DC | PRN
  Administered 2020-11-27: 2 mg via ORAL
  Filled 2020-11-27 (×2): qty 1

## 2020-11-27 MED ORDER — VITAMIN B-12 1000 MCG PO TABS
5000.0000 ug | ORAL_TABLET | Freq: Every day | ORAL | Status: DC
  Administered 2020-11-27 – 2020-11-28 (×2): 5000 ug via ORAL
  Filled 2020-11-27 (×2): qty 10

## 2020-11-27 MED ORDER — LEVOTHYROXINE SODIUM 50 MCG PO TABS
50.0000 ug | ORAL_TABLET | Freq: Every day | ORAL | Status: DC
  Administered 2020-11-28: 50 ug via ORAL
  Filled 2020-11-27: qty 1

## 2020-11-27 MED ORDER — LOSARTAN POTASSIUM 25 MG PO TABS
25.0000 mg | ORAL_TABLET | Freq: Two times a day (BID) | ORAL | Status: DC
  Administered 2020-11-27 – 2020-11-28 (×2): 25 mg via ORAL
  Filled 2020-11-27 (×2): qty 1

## 2020-11-27 MED ORDER — SODIUM CHLORIDE 0.9% FLUSH
3.0000 mL | Freq: Two times a day (BID) | INTRAVENOUS | Status: DC
  Administered 2020-11-27 – 2020-11-28 (×2): 3 mL via INTRAVENOUS

## 2020-11-27 MED ORDER — LORATADINE 10 MG PO TABS
10.0000 mg | ORAL_TABLET | Freq: Every day | ORAL | Status: DC
  Administered 2020-11-27: 10 mg via ORAL
  Filled 2020-11-27: qty 1

## 2020-11-27 MED ORDER — VERAPAMIL HCL 2.5 MG/ML IV SOLN
INTRAVENOUS | Status: AC
  Filled 2020-11-27: qty 2

## 2020-11-27 MED ORDER — VITAMIN D 25 MCG (1000 UNIT) PO TABS
5000.0000 [IU] | ORAL_TABLET | Freq: Three times a day (TID) | ORAL | Status: DC
  Administered 2020-11-27 – 2020-11-28 (×2): 5000 [IU] via ORAL
  Filled 2020-11-27 (×4): qty 5

## 2020-11-27 MED ORDER — ASPIRIN EC 81 MG PO TBEC
81.0000 mg | DELAYED_RELEASE_TABLET | Freq: Every day | ORAL | Status: DC
  Administered 2020-11-27: 81 mg via ORAL
  Filled 2020-11-27: qty 1

## 2020-11-27 MED ORDER — CLOPIDOGREL BISULFATE 75 MG PO TABS
75.0000 mg | ORAL_TABLET | Freq: Every day | ORAL | Status: DC
  Administered 2020-11-28: 75 mg via ORAL
  Filled 2020-11-27: qty 1

## 2020-11-27 MED ORDER — HYDRALAZINE HCL 20 MG/ML IJ SOLN
5.0000 mg | INTRAMUSCULAR | Status: AC | PRN
  Administered 2020-11-27 (×2): 5 mg via INTRAVENOUS

## 2020-11-27 MED ORDER — CLOPIDOGREL BISULFATE 300 MG PO TABS
ORAL_TABLET | ORAL | Status: DC | PRN
  Administered 2020-11-27: 300 mg via ORAL

## 2020-11-27 MED ORDER — ONDANSETRON HCL 4 MG/2ML IJ SOLN
4.0000 mg | Freq: Four times a day (QID) | INTRAMUSCULAR | Status: DC | PRN
  Administered 2020-11-27: 4 mg via INTRAVENOUS

## 2020-11-27 MED ORDER — PROPYLENE GLYCOL 0.6 % OP SOLN: 1.0000 [drp] | Freq: Three times a day (TID) | OPHTHALMIC | Status: DC | PRN

## 2020-11-27 MED ORDER — IODIXANOL 320 MG/ML IV SOLN
INTRAVENOUS | Status: DC | PRN
  Administered 2020-11-27: 150 mL

## 2020-11-27 MED ORDER — HYDRALAZINE HCL 20 MG/ML IJ SOLN
INTRAMUSCULAR | Status: DC | PRN
  Administered 2020-11-27: 10 mg via INTRAVENOUS

## 2020-11-27 MED ORDER — POLYVINYL ALCOHOL 1.4 % OP SOLN
1.0000 [drp] | Freq: Three times a day (TID) | OPHTHALMIC | Status: DC | PRN
  Filled 2020-11-27: qty 15

## 2020-11-27 MED ORDER — SODIUM CHLORIDE 0.9 % WEIGHT BASED INFUSION: 1.0000 mL/kg/h | INTRAVENOUS | Status: DC

## 2020-11-27 MED ORDER — ACETAMINOPHEN 325 MG PO TABS: 650.0000 mg | ORAL_TABLET | ORAL | Status: DC | PRN

## 2020-11-27 MED ORDER — ASPIRIN 81 MG PO CHEW: 81.0000 mg | CHEWABLE_TABLET | ORAL | Status: DC

## 2020-11-27 MED ORDER — SIMETHICONE 80 MG PO CHEW: 80.0000 mg | CHEWABLE_TABLET | Freq: Four times a day (QID) | ORAL | Status: DC | PRN

## 2020-11-27 MED ORDER — FUROSEMIDE 20 MG PO TABS
20.0000 mg | ORAL_TABLET | Freq: Every day | ORAL | Status: DC
  Administered 2020-11-27 – 2020-11-28 (×2): 20 mg via ORAL
  Filled 2020-11-27 (×2): qty 1

## 2020-11-27 MED ORDER — LIDOCAINE HCL (PF) 1 % IJ SOLN
INTRAMUSCULAR | Status: AC
  Filled 2020-11-27: qty 30

## 2020-11-27 MED ORDER — LIDOCAINE HCL (PF) 1 % IJ SOLN
INTRAMUSCULAR | Status: DC | PRN
  Administered 2020-11-27 (×2): 20 mL

## 2020-11-27 MED ORDER — NITROGLYCERIN IN D5W 200-5 MCG/ML-% IV SOLN
INTRAVENOUS | Status: AC
  Filled 2020-11-27: qty 250

## 2020-11-27 MED ORDER — SODIUM CHLORIDE 0.9% FLUSH: 3.0000 mL | INTRAVENOUS | Status: DC | PRN

## 2020-11-27 MED ORDER — SODIUM CHLORIDE 0.9 % IV SOLN: 250.0000 mL | INTRAVENOUS | Status: DC | PRN

## 2020-11-27 MED ORDER — PRAVASTATIN SODIUM 10 MG PO TABS
20.0000 mg | ORAL_TABLET | Freq: Every day | ORAL | Status: DC
  Administered 2020-11-27: 20 mg via ORAL
  Filled 2020-11-27 (×2): qty 2

## 2020-11-27 MED ORDER — FAMOTIDINE IN NACL 20-0.9 MG/50ML-% IV SOLN
20.0000 mg | Freq: Once | INTRAVENOUS | Status: AC
  Administered 2020-11-27: 20 mg via INTRAVENOUS
  Filled 2020-11-27: qty 50

## 2020-11-27 MED ORDER — MIDAZOLAM HCL 2 MG/2ML IJ SOLN
INTRAMUSCULAR | Status: DC | PRN
  Administered 2020-11-27: 0.5 mg via INTRAVENOUS

## 2020-11-27 MED ORDER — AMOXICILLIN 500 MG PO CAPS
2000.0000 mg | ORAL_CAPSULE | Freq: Once | ORAL | Status: DC | PRN
  Filled 2020-11-27: qty 4

## 2020-11-27 MED ORDER — ERYTHROMYCIN 5 MG/GM OP OINT
1.0000 "application " | TOPICAL_OINTMENT | Freq: Every evening | OPHTHALMIC | Status: DC | PRN
  Filled 2020-11-27: qty 3.5

## 2020-11-27 MED ORDER — POTASSIUM CHLORIDE CRYS ER 10 MEQ PO TBCR
10.0000 meq | EXTENDED_RELEASE_TABLET | Freq: Every day | ORAL | Status: DC
  Administered 2020-11-27 – 2020-11-28 (×2): 10 meq via ORAL
  Filled 2020-11-27 (×2): qty 1

## 2020-11-27 MED ORDER — LABETALOL HCL 5 MG/ML IV SOLN
10.0000 mg | INTRAVENOUS | Status: DC | PRN
Start: 2020-11-27 — End: 2020-11-28
  Administered 2020-11-27: 10 mg via INTRAVENOUS

## 2020-11-27 MED ORDER — ASCORBIC ACID 500 MG PO TABS
1000.0000 mg | ORAL_TABLET | Freq: Every day | ORAL | Status: DC
  Administered 2020-11-27: 1000 mg via ORAL
  Filled 2020-11-27 (×2): qty 2

## 2020-11-27 MED ORDER — METOPROLOL SUCCINATE ER 50 MG PO TB24
50.0000 mg | ORAL_TABLET | Freq: Every day | ORAL | Status: DC
Start: 2020-11-28 — End: 2020-11-28
  Administered 2020-11-28: 50 mg via ORAL
  Filled 2020-11-27: qty 1

## 2020-11-27 MED ORDER — CLOPIDOGREL BISULFATE 300 MG PO TABS
ORAL_TABLET | ORAL | Status: AC
  Filled 2020-11-27: qty 1

## 2020-11-27 MED ORDER — HEPARIN SODIUM (PORCINE) 1000 UNIT/ML IJ SOLN
INTRAMUSCULAR | Status: AC
  Filled 2020-11-27: qty 1

## 2020-11-27 MED ORDER — ALPRAZOLAM 0.25 MG PO TABS: 0.1250 mg | ORAL_TABLET | Freq: Two times a day (BID) | ORAL | Status: DC | PRN

## 2020-11-27 MED ORDER — SODIUM CHLORIDE 0.9 % IV SOLN: INTRAVENOUS | Status: AC

## 2020-11-27 MED ORDER — ONDANSETRON HCL 4 MG/2ML IJ SOLN
INTRAMUSCULAR | Status: AC
  Filled 2020-11-27: qty 2

## 2020-11-27 MED ORDER — SODIUM CHLORIDE 0.9 % WEIGHT BASED INFUSION
3.0000 mL/kg/h | INTRAVENOUS | Status: DC
  Administered 2020-11-27: 3 mL/kg/h via INTRAVENOUS

## 2020-11-27 SURGICAL SUPPLY — 33 items
BALLN JADE .018 5.0 X 20 (BALLOONS) ×3
BALLN JADE .018 5.0 X 40 (BALLOONS) ×3
BALLOON JADE .018 5.0 X 20 (BALLOONS) ×2 IMPLANT
BALLOON JADE .018 5.0 X 40 (BALLOONS) ×2 IMPLANT
CATH ANGIO 5F PIGTAIL 65CM (CATHETERS) ×3 IMPLANT
CATH CROSS OVER TEMPO 5F (CATHETERS) ×3 IMPLANT
CATH QUICKCROSS SUPP .018X90CM (MICROCATHETER) ×3 IMPLANT
CATH STRAIGHT 5FR 65CM (CATHETERS) ×3 IMPLANT
CROWN STEALTH MICRO-30 1.25MM (CATHETERS) ×3 IMPLANT
DIAMONDBACK SOLID OAS 2.0MM (CATHETERS) ×3
GUIDEWIRE ANGLED .035X150CM (WIRE) ×3 IMPLANT
GUIDEWIRE ZILIENT 6G 018 (WIRE) ×3 IMPLANT
KIT ENCORE 40 (KITS) ×3 IMPLANT
KIT PV (KITS) ×3 IMPLANT
LUBRICANT VIPERSLIDE CORONARY (MISCELLANEOUS) ×3 IMPLANT
PATCH THROMBIX TOPICAL PLAIN (HEMOSTASIS) ×3 IMPLANT
SHEATH PINNACLE 5F 10CM (SHEATH) ×3 IMPLANT
SHEATH PINNACLE MP 7F 45CM (SHEATH) ×3 IMPLANT
SHEATH PINNACLE ST 6F 45CM (SHEATH) ×3 IMPLANT
SHEATH PROBE COVER 6X72 (BAG) ×3 IMPLANT
STENT VIABAHN 6X25X120 (Permanent Stent) ×3 IMPLANT
STOPCOCK MORSE 400PSI 3WAY (MISCELLANEOUS) ×3 IMPLANT
SYR MEDRAD MARK 7 150ML (SYRINGE) ×3 IMPLANT
SYSTEM DIMNDBCK SLD OAS 2.0MM (CATHETERS) ×2 IMPLANT
TAPE VIPERTRACK RADIOPAQ (MISCELLANEOUS) ×2 IMPLANT
TAPE VIPERTRACK RADIOPAQUE (MISCELLANEOUS) ×3
TRANSDUCER W/STOPCOCK (MISCELLANEOUS) ×3 IMPLANT
TRAY PV CATH (CUSTOM PROCEDURE TRAY) ×3 IMPLANT
TUBING CIL FLEX 10 FLL-RA (TUBING) ×3 IMPLANT
WIRE G VAS 035X260 STIFF (WIRE) ×3 IMPLANT
WIRE HITORQ VERSACORE ST 145CM (WIRE) ×3 IMPLANT
WIRE ROSEN-J .035X180CM (WIRE) ×3 IMPLANT
WIRE VIPER ADVANCE .017X335CM (WIRE) ×3 IMPLANT

## 2020-11-27 NOTE — Plan of Care (Signed)
  Problem: Education: Goal: Individualized Educational Video(s) Outcome: Progressing   Problem: Activity: Goal: Ability to return to baseline activity level will improve Outcome: Progressing   Problem: Cardiovascular: Goal: Ability to achieve and maintain adequate cardiovascular perfusion will improve Outcome: Progressing Goal: Vascular access site(s) Level 0-1 will be maintained Outcome: Progressing   Problem: Health Behavior/Discharge Planning: Goal: Ability to safely manage health-related needs after discharge will improve Outcome: Progressing   Problem: Health Behavior/Discharge Planning: Goal: Ability to manage health-related needs will improve Outcome: Progressing   Problem: Clinical Measurements: Goal: Ability to maintain clinical measurements within normal limits will improve Outcome: Progressing Goal: Will remain free from infection Outcome: Progressing Goal: Diagnostic test results will improve Outcome: Progressing   Problem: Activity: Goal: Risk for activity intolerance will decrease Outcome: Progressing   Problem: Nutrition: Goal: Adequate nutrition will be maintained Outcome: Progressing   Problem: Elimination: Goal: Will not experience complications related to bowel motility Outcome: Progressing Goal: Will not experience complications related to urinary retention Outcome: Progressing   Problem: Safety: Goal: Ability to remain free from injury will improve Outcome: Progressing   Problem: Skin Integrity: Goal: Risk for impaired skin integrity will decrease Outcome: Progressing

## 2020-11-27 NOTE — Progress Notes (Signed)
Patient received from cath lab with complaints of nausea. Right groin puffy and had some drainage on the dressing. Left groin site. Shadowing on dressing, no hematoma, no bleeding, soft level 0. q 30 minute checks and blood pressures. Sites right and left groin unchanged during shift. Patient sleeping and when awake continued to complain of nausea. Discussed treatment with Sande Rives PA and when patient rechecked denied need for any medication. Drinking water without any nausea. Ordered dinner for her. Resting comfortabley>

## 2020-11-27 NOTE — Interval H&P Note (Signed)
History and Physical Interval Note:  11/27/2020 7:46 AM  [name]  has presented today for surgery, with the diagnosis of right leg claudication.  The various methods of treatment have been discussed with the patient and family. After consideration of risks, benefits and other options for treatment, the patient has consented to  Procedure(s): ABDOMINAL AORTOGRAM W/LOWER EXTREMITY (N/A) as a surgical intervention.  The patient's history has been reviewed, patient examined, no change in status, stable for surgery.  I have reviewed the patient's chart and labs.  Questions were answered to the patient's satisfaction.     Quay Burow

## 2020-11-27 NOTE — Progress Notes (Signed)
64fr sheath was removed by Eddie Dibbles R.N. manual pressure was applied to left groin for 30 minutes. Site level 0 no s+s of hematoma. Tegaderm dressing applied. Bedrest instructions given.   There is a quarter size lump where she got lovenox injections previously , this is above the left groin access site.   Right groin is swollen, no hard hematoma is palpable.   Bilateral dp and pt pulses present with doppler.    Bedrest begins at 13:00:00

## 2020-11-27 NOTE — Progress Notes (Addendum)
   Received page from RN that patient has been nauseous since returning from cath and asked if it would be OK to give 20mg  Pepcid IVPB. This is OK. Will place order. She has a history of prolonged QTc. Last QTc 490 ms on EKG on 11/07/2020. Will recheck EKG to ensure QTc is stable before ordering PRN Zofran.  Darreld Mclean, PA-C 11/27/2020 6:45 PM  ADDENDUM:  RN called back and states patient said she actually feels OK right now. So can hold off on above orders for now.  Darreld Mclean, PA-C 11/27/2020 6:53 PM

## 2020-11-27 NOTE — Progress Notes (Signed)
Pt request no blood or blood products. Blood refusal form signed and sent to blood bank per policy.

## 2020-11-27 NOTE — Progress Notes (Signed)
Arrived to holding area rt. Groin had a level 1 hematoma applies pressure for 10 min and handed off care to [place] in the Holding area. Rt. Groin was soft on hand off.

## 2020-11-28 ENCOUNTER — Other Ambulatory Visit: Payer: Self-pay | Admitting: Physician Assistant

## 2020-11-28 DIAGNOSIS — N183 Chronic kidney disease, stage 3 unspecified: Secondary | ICD-10-CM | POA: Diagnosis not present

## 2020-11-28 DIAGNOSIS — Z950 Presence of cardiac pacemaker: Secondary | ICD-10-CM | POA: Diagnosis not present

## 2020-11-28 DIAGNOSIS — I70211 Atherosclerosis of native arteries of extremities with intermittent claudication, right leg: Secondary | ICD-10-CM | POA: Diagnosis not present

## 2020-11-28 DIAGNOSIS — K921 Melena: Secondary | ICD-10-CM

## 2020-11-28 DIAGNOSIS — I739 Peripheral vascular disease, unspecified: Secondary | ICD-10-CM | POA: Diagnosis not present

## 2020-11-28 DIAGNOSIS — I495 Sick sinus syndrome: Secondary | ICD-10-CM | POA: Diagnosis not present

## 2020-11-28 DIAGNOSIS — Z885 Allergy status to narcotic agent status: Secondary | ICD-10-CM | POA: Diagnosis not present

## 2020-11-28 DIAGNOSIS — I5032 Chronic diastolic (congestive) heart failure: Secondary | ICD-10-CM | POA: Diagnosis not present

## 2020-11-28 DIAGNOSIS — Z952 Presence of prosthetic heart valve: Secondary | ICD-10-CM | POA: Diagnosis not present

## 2020-11-28 DIAGNOSIS — I498 Other specified cardiac arrhythmias: Secondary | ICD-10-CM | POA: Diagnosis not present

## 2020-11-28 DIAGNOSIS — I480 Paroxysmal atrial fibrillation: Secondary | ICD-10-CM | POA: Diagnosis not present

## 2020-11-28 DIAGNOSIS — K922 Gastrointestinal hemorrhage, unspecified: Secondary | ICD-10-CM

## 2020-11-28 DIAGNOSIS — D500 Iron deficiency anemia secondary to blood loss (chronic): Secondary | ICD-10-CM | POA: Diagnosis not present

## 2020-11-28 DIAGNOSIS — Z888 Allergy status to other drugs, medicaments and biological substances status: Secondary | ICD-10-CM | POA: Diagnosis not present

## 2020-11-28 DIAGNOSIS — Z79899 Other long term (current) drug therapy: Secondary | ICD-10-CM | POA: Diagnosis not present

## 2020-11-28 DIAGNOSIS — Z7901 Long term (current) use of anticoagulants: Secondary | ICD-10-CM | POA: Diagnosis not present

## 2020-11-28 DIAGNOSIS — D649 Anemia, unspecified: Secondary | ICD-10-CM | POA: Diagnosis not present

## 2020-11-28 DIAGNOSIS — I130 Hypertensive heart and chronic kidney disease with heart failure and stage 1 through stage 4 chronic kidney disease, or unspecified chronic kidney disease: Secondary | ICD-10-CM | POA: Diagnosis not present

## 2020-11-28 LAB — BASIC METABOLIC PANEL
Anion gap: 7 (ref 5–15)
BUN: 16 mg/dL (ref 8–23)
CO2: 22 mmol/L (ref 22–32)
Calcium: 8.2 mg/dL — ABNORMAL LOW (ref 8.9–10.3)
Chloride: 110 mmol/L (ref 98–111)
Creatinine, Ser: 1.08 mg/dL — ABNORMAL HIGH (ref 0.44–1.00)
GFR, Estimated: 51 mL/min — ABNORMAL LOW (ref >60)
Glucose, Bld: 82 mg/dL (ref 70–99)
Potassium: 3.9 mmol/L (ref 3.5–5.1)
Sodium: 139 mmol/L (ref 135–145)

## 2020-11-28 LAB — CBC
HCT: 27 % — ABNORMAL LOW (ref 36.0–46.0)
HCT: 27.6 % — ABNORMAL LOW (ref 36.0–46.0)
Hemoglobin: 8.9 g/dL — ABNORMAL LOW (ref 12.0–15.0)
Hemoglobin: 9.1 g/dL — ABNORMAL LOW (ref 12.0–15.0)
MCH: 32.2 pg (ref 26.0–34.0)
MCH: 32.3 pg (ref 26.0–34.0)
MCHC: 33 g/dL (ref 30.0–36.0)
MCHC: 33 g/dL (ref 30.0–36.0)
MCV: 97.8 fL (ref 80.0–100.0)
MCV: 97.9 fL (ref 80.0–100.0)
Platelets: 127 10*3/uL — ABNORMAL LOW (ref 150–400)
Platelets: 141 10*3/uL — ABNORMAL LOW (ref 150–400)
RBC: 2.76 MIL/uL — ABNORMAL LOW (ref 3.87–5.11)
RBC: 2.82 MIL/uL — ABNORMAL LOW (ref 3.87–5.11)
RDW: 14.4 % (ref 11.5–15.5)
RDW: 14.6 % (ref 11.5–15.5)
WBC: 4.9 10*3/uL (ref 4.0–10.5)
WBC: 5.6 10*3/uL (ref 4.0–10.5)
nRBC: 0 % (ref 0.0–0.2)
nRBC: 0 % (ref 0.0–0.2)

## 2020-11-28 LAB — OCCULT BLOOD X 1 CARD TO LAB, STOOL: Fecal Occult Bld: POSITIVE — AB

## 2020-11-28 LAB — PROTIME-INR
INR: 1.1 (ref 0.8–1.2)
Prothrombin Time: 13.7 seconds (ref 11.4–15.2)

## 2020-11-28 MED ORDER — CLOPIDOGREL BISULFATE 75 MG PO TABS: 75.0000 mg | ORAL_TABLET | Freq: Every day | ORAL | 6 refills | Status: AC

## 2020-11-28 MED ORDER — GABAPENTIN 300 MG PO CAPS
300.0000 mg | ORAL_CAPSULE | Freq: Two times a day (BID) | ORAL | Status: DC
Start: 2020-11-28 — End: 2020-11-28
  Administered 2020-11-28: 300 mg via ORAL
  Filled 2020-11-28 (×2): qty 1

## 2020-11-28 MED ORDER — PANTOPRAZOLE SODIUM 40 MG PO TBEC
40.0000 mg | DELAYED_RELEASE_TABLET | Freq: Two times a day (BID) | ORAL | Status: DC
  Administered 2020-11-28: 40 mg via ORAL
  Filled 2020-11-28: qty 1

## 2020-11-28 MED ORDER — SUCRALFATE 1 G PO TABS
1.0000 g | ORAL_TABLET | Freq: Three times a day (TID) | ORAL | Status: DC
  Administered 2020-11-28 (×2): 1 g via ORAL
  Filled 2020-11-28 (×2): qty 1

## 2020-11-28 MED ORDER — PANTOPRAZOLE SODIUM 40 MG PO TBEC: 40.0000 mg | DELAYED_RELEASE_TABLET | Freq: Two times a day (BID) | ORAL | 2 refills | Status: DC

## 2020-11-28 MED FILL — PANTOPRAZOLE SOD DR 40 MG T: 40 | 30 days supply | Qty: 60 | Fill #0

## 2020-11-28 MED FILL — CLOPIDOGREL 75 MG TABLET: 75 | 30 days supply | Qty: 30 | Fill #0

## 2020-11-28 NOTE — Progress Notes (Addendum)
The patient has been seen in conjunction with Vin Bhagat, PAC. All aspects of care have been considered and discussed. The patient has been personally interviewed, examined, and all clinical data has been reviewed.   Patient is ambulated.  She is asymptomatic.  No groin hematoma.  No back pain.  Hemoglobin was 13 on 11/25/2020 and this morning 8.9, and 9.1 over a 6-hour time.  Normal sinus rhythm on monitor  Right and left femoral access sites do not reveal hematoma.  Plan to ambulate, would not resume Coumadin, will not complete Lovenox bridge, continue Plavix, and have early follow-up with GI/primary care after hemoglobin rechecked in 72 hours.  She should call if dark stools develop/continue.   Progress Note  Patient Name: [name] Date of Encounter: 11/28/2020  [facility] Cardiologist: [name], MD  PV: Dr. Gwenlyn Found   Subjective   Hemoglobin dropped.  Patient reported prior stomach and colon cancer s/p resection.  She has chronic diarrhea however noted dark stool since on Lovenox. She was given Imodium in Cath Lab yesterday. Denies chest pain or shortness of breath. No pain on bilateral groin cath site.  Inpatient Medications    Scheduled Meds: . vitamin C  1,000 mg Oral Daily  . aspirin EC  81 mg Oral Daily  . cholecalciferol  5,000 Units Oral TID  . clopidogrel  75 mg Oral Q breakfast  . famotidine  40 mg Oral Daily  . ferrous sulfate  325 mg Oral Q1500  . furosemide  20 mg Oral Daily  . levothyroxine  50 mcg Oral Q0600  . loratadine  10 mg Oral QHS  . losartan  25 mg Oral BID  . metoprolol succinate  50 mg Oral Daily  . potassium chloride  10 mEq Oral Daily  . pravastatin  20 mg Oral Daily  . sodium chloride flush  3 mL Intravenous Q12H  . verapamil  120 mg Oral BID  . vitamin B-12  5,000 mcg Oral Daily   Continuous Infusions: . sodium chloride     PRN Meds: sodium chloride, acetaminophen, acetaminophen, ALPRAZolam, amoxicillin,  erythromycin, labetalol, loperamide, ondansetron (ZOFRAN) IV, polyvinyl alcohol, simethicone, sodium chloride flush, tobramycin-dexamethasone   Vital Signs    Vitals:   11/27/20 1950 11/27/20 2102 11/28/20 0101 11/28/20 0436  BP: 124/60 124/60 (!) 121/58 (!) 125/51  Pulse: 68 68 72 81  Resp: _0 Temp: 98.3 F (36.8 C) 98.3 F (36.8 C) 98.2 F (36.8 C) 98.2 F (36.8 C)  TempSrc:  Oral Oral Oral  SpO2:  97% 95% 96%  Weight:      Height:        Intake/Output Summary (Last 24 hours) at 11/28/2020 0957 Last data filed at 11/28/2020 0542 Gross per 24 hour  Intake 225 ml  Output 350 ml  Net -125 ml   Last 3 Weights 11/27/2020 11/17/2020 11/07/2020  Weight (lbs) 91 lb 9.6 oz 95 lb 95 lb 12.8 oz  Weight (kg) 41.549 kg 43.092 kg 43.455 kg      Telemetry    Normal sinus rhythm- Personally Reviewed  ECG    No new tracing  Physical Exam   GEN:  Thin frail elderly female in no acute distress.   Neck: No JVD Cardiac: RRR, no murmurs, rubs, or gallops.  Bilateral groin cath site without ecchymosis or hematoma.  Mild ecchymosis at Lovenox injection site. Respiratory: Clear to auscultation bilaterally. GI: Soft, nontender, non-distended  MS: No edema; No deformity. Neuro:  Nonfocal  Psych: Normal affect   Labs    Chemistry Recent Labs  Lab 11/25/20 1122 11/28/20 0255  NA 145* 139  K 3.8 3.9  CL 107* 110  CO2 21 22  GLUCOSE 76 82  BUN 13 16  CREATININE 0.98 1.08*  CALCIUM 9.2 8.2*  GFRNONAA  --  51*  ANIONGAP  --  7     Hematology Recent Labs  Lab 11/25/20 1122 11/28/20 0255  WBC 6.1 4.9  RBC 4.19 2.76*  HGB 13.2 8.9*  HCT 39.9 27.0*  MCV 95 97.8  MCH 31.5 32.2  MCHC 33.1 33.0  RDW 13.1 14.4  PLT 175 127*    Radiology    PERIPHERAL VASCULAR CATHETERIZATION  Result Date: 11/27/2020  [id number] LOCATION:  FACILITY: [facility] PHYSICIAN: Quay Burow, M.D. 1934-01-30 DATE OF PROCEDURE:  11/27/2020 DATE OF DISCHARGE: PV Angiogram/Intervention History  obtained from chart review.DELESHA POHLMAN is a 87 y.o. 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  She was referred by Dr. [name], her primary cardiologist for peripheral vascular evaluation because of right lower extremity lifestyle limiting claudication.  She did have a TAVR procedure via the left carotid approach 10/30/2019.  Otherwise she has a history of tachybradycardia syndrome status post permanent transvenous pacemaker insertion, hypertension, hyperlipidemia and a solitary kidney with CKD stage III.  She is a Restaurant manager, fast food.  She had a CTA performed in her lower extremities 10/16/2019 revealing diffuse calcified aortoiliac disease with significant right extra iliac artery stenosis which is confirmed by her Doppler studies performed 10/29/2020 revealing a right ABI of 0.77 with a right external iliac artery stenosis.  She does have lifestyle limiting right lower extremity claudication.  She presents today for outpatient peripheral angiography potential endovascular therapy.  Her warfarin was held and she was bridged with Lovenox.  INR today is 1.1. Pre Procedure Diagnosis: Peripheral arterial disease Post Procedure Diagnosis: Peripheral arterial disease Operators: Dr. Quay Burow Procedures Performed:  1.  Ultrasound-guided left common femoral access  2.  Abdominal aortogram/bilateral iliac angiogram  3.  Contralateral access (secondary catheter placement)  4.  Orbital atherectomy right external iliac artery and common femoral artery  5.  PTA right extra iliac artery and common femoral artery  6.  Viabahn covered stenting right common femoral artery. PROCEDURE DESCRIPTION: The patient was brought to the second floor [place] [facility] cath lab in the the postabsorptive state. She was premedicated with IV Versed and fentanyl. Her right and left groin  were prepped and shaved in usual sterile fashion. Xylocaine 1% was used for local anesthesia.  I initially gained access using ultrasound guidance in the right common femoral artery.  The wire would not pass much beyond the needle tip in a angiogram through the needle revealed a subtotally occluded highly calcified distal right common femoral artery.  I then abandon the right side and moved to the left side.  I accessed the left common femoral artery under ultrasound guidance.  A digital image was captured and placed the patient's chart.  A 5 French sheath was inserted into the left common femoral artery using standard Seldinger technique.  A 5 French pigtail catheters placed the distal abdominal aorta.  Distal abdominal aortography and bilateral iliac angiography were performed.  On the pigtail was used for the entirety of the case.  Retroaortic pressures monitored during the case. Angiographic Data: 1: Abdominal  aortogram-the renal arteries widely patent.  The infrarenal abdominal aorta was fluoroscopically heavily calcified. 2: Left lower extremity-the left common and extra iliac arteries widely patent 3: Right lower extremity-there was 70% segmental calcified right external iliac artery stenosis followed by 99% fairly focal calcified distal right common femoral artery stenosis.   Ms. Otte has severely calcified right external and common femoral artery stenosis.  We will proceed with orbital atherectomy followed by PTA plus or minus covered stenting. Procedure Description: Contralateral access was obtained with a crossover catheter, Glidewire, 035 Amplatz wire and a 7 [place] multipurpose 45 cm destination sheath.  Patient received a total of 90 units of heparin with an ACT of 327.  A total of 150 cc of contrast was administered to the patient. I crossed the subtotally occluded calcified distal right common femoral artery stenosis with an 018 6 gm Zilient  wire through an 018 quick cross endhole catheter.  I  then exchanged for an 014/017 Viper wire.  I initially used a 1.25 micro bur to create a channel through the subtotally occluded highly calcified distal right common femoral artery of 220,000 RPMs.  I then switched to a 2 mm solid And perform multiple passes at 120,000 RPMs.  I did administer intra-arterial nitroglycerin.  I then exchanged back for the Zilient  wire and performed PTA with a 5 mm x 4 cm Sterling 018 over-the-wire balloon throughout the entire treated segment.  The common femoral lesion did appear somewhat "shaggy" I elected to cover this with a 6 mm x 25 mm long Viabahn covered stent postdilated with a 5 mm x 2 cm balloon resulting reduction of a 99% calcified distal right common femoral artery stenosis to 0% residual 70% calcified right external iliac artery stenosis less than 20% residual.  The patient tolerated the procedure well.  The destination sheath was then exchanged over the [facility] wire for a short 7 [place] sheath which was secured in place.  The patient left lab in stable condition.  She did receive 300 mg of p.o. Plavix at the end of the case. Final Impression: Successful orbital atherectomy followed by PTA and covered stenting of a subtotally occluded highly calcified distal right common femoral artery stenosis as well as a highly calcified segmental moderately stenosed right external iliac artery stenosis in the setting of lifestyle limiting claudication.  The patient will be reheparinized 8 hours after sheath removal without a bolus.  She can be treated with Plavix and warfarin alone.  She had Lovenox bridging prior to the procedure and will be reasonable to perform Lovenox bridging at discharge until she is therapeutic.  We will get lower extremity arterial Doppler studies notify office next week and I will see her back the week after for follow-up. Quay Burow. MD, [facility] 11/27/2020 10:10 AM    Cardiac Studies   ABDOMINAL AORTOGRAM W/LOWER EXTREMITY  PERIPHERAL VASCULAR  ATHERECTOMY  PERIPHERAL VASCULAR INTERVENTION   Procedures Performed:               1.  Ultrasound-guided left common femoral access               2.  Abdominal aortogram/bilateral iliac angiogram               3.  Contralateral access (secondary catheter placement)               4.  Orbital atherectomy right external iliac artery and common femoral artery  5.  PTA right extra iliac artery and common femoral artery               6.  Viabahn covered stenting right common femoral artery.  Angiographic Data:   1: Abdominal aortogram-the renal arteries widely patent.  The infrarenal abdominal aorta was fluoroscopically heavily calcified. 2: Left lower extremity-the left common and extra iliac arteries widely patent 3: Right lower extremity-there was 70% segmental calcified right external iliac artery stenosis followed by 99% fairly focal calcified distal right common femoral artery stenosis.  IMPRESSION: Ms. Goshorn has severely calcified right external and common femoral artery stenosis.  We will proceed with orbital atherectomy followed by PTA plus or minus covered stenting.  Procedure Description: Contralateral access was obtained with a crossover catheter, Glidewire, 035 Amplatz wire and a 7 [place] multipurpose 45 cm destination sheath.  Patient received a total of 90 units of heparin with an ACT of 327.  A total of 150 cc of contrast was administered to the patient.  I crossed the subtotally occluded calcified distal right common femoral artery stenosis with an 018 6 gm Zilient  wire through an 018 quick cross endhole catheter.  I then exchanged for an 014/017 Viper wire.  I initially used a 1.25 micro bur to create a channel through the subtotally occluded highly calcified distal right common femoral artery of 220,000 RPMs.  I then switched to a 2 mm solid  And perform multiple passes at 120,000 RPMs.  I did administer intra-arterial nitroglycerin.  I then exchanged back  for the Zilient  wire and performed PTA with a 5 mm x 4 cm Sterling 018 over-the-wire balloon throughout the entire treated segment.  The common femoral lesion did appear somewhat "shaggy" I elected to cover this with a 6 mm x 25 mm long Viabahn covered stent postdilated with a 5 mm x 2 cm balloon resulting reduction of a 99% calcified distal right common femoral artery stenosis to 0% residual 70% calcified right external iliac artery stenosis less than 20% residual.  The patient tolerated the procedure well.  The destination sheath was then exchanged over the [facility] wire for a short 7 [place] sheath which was secured in place.  The patient left lab in stable condition.  She did receive 300 mg of p.o. Plavix at the end of the case.  Final Impression: Successful orbital atherectomy followed by PTA and covered stenting of a subtotally occluded highly calcified distal right common femoral artery stenosis as well as a highly calcified segmental moderately stenosed right external iliac artery stenosis in the setting of lifestyle limiting claudication.  The patient will be reheparinized 8 hours after sheath removal without a bolus.  She can be treated with Plavix and warfarin alone.  She had Lovenox bridging prior to the procedure and will be reasonable to perform Lovenox bridging at discharge until she is therapeutic.  We will get lower extremity arterial Doppler studies notify office next week and I will see her back the week after for follow-up.  Patient Profile     88 y.o. female with PMH of chronic diastolic CHF, paroxysmal atrial fibrillation, aortic stenosis s/p TAVR, tachy-brady syndrome s/p PPM, HTN, HLD, left central retinal artery occlusion from cholesterol plaque, stroke, bladder cancer, stomach cancer, solitary kidney with CKD stage 3, and anemia (Jehovah's witness) presented for outpatient PV intervention.   Assessment & Plan    1.  Peripheral arterial disease -s/p successful orbital atherectomy  followed by PTA and covered stenting of a  subtotally occluded highly calcified distal right common femoral artery stenosis as well as a highly calcified segmental moderately stenosed right external iliac artery stenosis in the setting of lifestyle limiting claudication. -Plan to treat with Plavix and warfarin alone   2.  Anemia - Hgb dropped from 13.2 (on 3/22) to 8.9 today -No hematoma at catheter site on bilateral groin -Patient reported history of stomach and colon cancer and prior resection -He has been dealing with chronic diarrhea however noted dark stool since on Lovenox for bridging (prior history of stroke as well as GI bleed) -Suspected internal GI bleed -Stat CBC and stool guaiac - Pt is Jehovah's witness  For questions or updates, please contact [place] Please consult [email or web address] for contact info under        SignedLeanor Kail, PA  11/28/2020, 9:57 AM

## 2020-11-28 NOTE — Progress Notes (Signed)
Daily Rounding Note  11/28/2020, 2:02 PM  LOS: 0 days   SUBJECTIVE:   Chief complaint: Dark stools a few days ago at home.  Patient is here following 11/27/2020 atherectomy, PTA, stenting of subtotally occluded, highly calcified distal R CFA stenosis.  Postprocedure patient had a level 1 hematoma in the right groin that responded to holding pressure for 10 minutes.   Started Lovenox 3/24, last injection was on Wednesday 3/23.  This was in addition to chronic Coumadin. She had black-colored but not bloody stools on at least one occasion sometime earlier this week which did not persist.  Also reports dysphagia with the sense that food is getting stuck in her esophagus and occasionally having to regurgitate food and liquids that has been present for several months.  For about 1 week she has had anorexia and early satiety.  No abdominal pain.  No nausea.   Last night after returning from the procedure she had some nausea.  This has not recurred today.  She did not vomit.  She has chronic loose stools, rarely are her stools formed. Today, within the last hour she has had a small amount of formed, light to medium brown stool.  Never a stool submitted to the lab around 9 this morning tested FOBT positive. Hgb was 13.2 on 322, was 8.9 early this morning and 9.1 on recheck 7 hours later. Platelets dropped as low as 127 this morning but are 141 on recheck. Patient is feeling well but she has not gotten up out of bed to "test the waters" in terms of how her legs feel, if she has dizziness, weakness, dyspnea etc.  She reports no dyspnea at rest. At home she has been negotiating climbing 7 stairs from the ground floor of her house up to the mid level of a trilevel home. Her daughters keep a close eye on her and provide a good deal of history.  At discharge the plan is to continue Plavix which started this morning but to stop Lovenox and  Coumadin.[name] GI wise patient takes famotidine daily, iron daily, sucralfate.  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  03/2016 EGD with severe esophagitis, friable mucosa and edema at site of gastric wedge resection.  11/2015 colonoscopy normal.  04/2017 EGD for nausea and vomiting showed normal esophagus, postsurgical deformity in the cardiac as expected status post gastrojejunostomy.  Unable to reach the Roux limb anastomosis.  Normal examined jejunum.     OBJECTIVE:         Vital signs in last 24 hours:    Temp:  [98.2 F (36.8 C)-98.3 F (36.8 C)] 98.2 F (36.8 C) (03/25 0436) Pulse Rate:  [59-81] 81 (03/25 0436) Resp:  [14-23] 17 (03/25 0436) BP: (120-144)/(44-60) 125/51 (03/25 0436) SpO2:  [95 %-98 %] 96 % (03/25 0436) Last BM Date: 11/27/20 Filed Weights   11/27/20 0546  Weight: 41.5 kg   General: Pleasant, comfortable, looks her age but looks quite well. Heart: RRR.  Slight systolic murmur Chest: Clear bilaterally.  No labored breathing or cough Abdomen: Soft, thin.  No masses.  Active bowel sounds. Gauze bandage covering puncture site in right groin.  No extensive hematoma in the region. Extremities: No CCE. Neuro/Psych: Oriented x3.  Appropriate.  Alert.  Moves all 4 limbs.  No tremor or gross weakness  Intake/Output from previous day: 03/24 0701 - 03/25 0700 In: 225 [P.O.:125; I.V.:100] Out: 350 [Urine:350]  Intake/Output this shift: No intake/output data recorded.  Lab Results: Recent Labs    11/28/20 0255 11/28/20 0959  WBC 4.9 5.6  HGB 8.9* 9.1*  HCT 27.0* 27.6*  PLT 127* 141*   BMET Recent Labs    11/28/20 0255  NA 139  K 3.9  CL 110  CO2 22  GLUCOSE 82  BUN 16  CREATININE 1.08*  CALCIUM 8.2*   LFT No results for input(s): PROT, ALBUMIN, AST, ALT, ALKPHOS, BILITOT, BILIDIR, IBILI in the last 72 hours. PT/INR Recent Labs    11/27/20 0616 11/28/20 0959   LABPROT 14.1 13.7  INR 1.1 1.1   Hepatitis Panel No results for input(s): HEPBSAG, HCVAB, HEPAIGM, HEPBIGM in the last 72 hours.  Studies/Results: PERIPHERAL VASCULAR CATHETERIZATION  Result Date: 11/27/2020  [id number] LOCATION:  FACILITY: [facility] PHYSICIAN: Quay Burow, M.D. Feb 04, 1934 DATE OF PROCEDURE:  11/27/2020 DATE OF DISCHARGE: PV Angiogram/Intervention History obtained from chart review.NIJAH TEJERA is a 87 y.o. 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  She was referred by Dr. [name], her primary cardiologist for peripheral vascular evaluation because of right lower extremity lifestyle limiting claudication.  She did have a TAVR procedure via the left carotid approach 10/30/2019.  Otherwise she has a history of tachybradycardia syndrome status post permanent transvenous pacemaker insertion, hypertension, hyperlipidemia and a solitary kidney with CKD stage III.  She is a Restaurant manager, fast food.  She had a CTA performed in her lower extremities 10/16/2019 revealing diffuse calcified aortoiliac disease with significant right extra iliac artery stenosis which is confirmed by her Doppler studies performed 10/29/2020 revealing a right ABI of 0.77 with a right external iliac artery stenosis.  She does have lifestyle limiting right lower extremity claudication.  She presents today for outpatient peripheral angiography potential endovascular therapy.  Her warfarin was held and she was bridged with Lovenox.  INR today is 1.1. Pre Procedure Diagnosis: Peripheral arterial disease Post Procedure Diagnosis: Peripheral arterial disease Operators: Dr. Quay Burow Procedures Performed:  1.  Ultrasound-guided left common femoral access  2.  Abdominal aortogram/bilateral iliac angiogram  3.  Contralateral access (secondary catheter placement)  4.  Orbital  atherectomy right external iliac artery and common femoral artery  5.  PTA right extra iliac artery and common femoral artery  6.  Viabahn covered stenting right common femoral artery. PROCEDURE DESCRIPTION: The patient was brought to the second floor [facility] cath lab in the the postabsorptive state. She was premedicated with IV Versed and fentanyl. Her right and left groin were prepped and shaved in usual sterile fashion. Xylocaine 1% was used for local anesthesia.  I initially gained access using ultrasound guidance in the right common femoral artery.  The wire would not pass much beyond the needle tip in a angiogram through the needle revealed a subtotally occluded highly calcified distal right common femoral artery.  I then abandon the right side and moved to the left side.  I accessed the left common femoral artery under ultrasound guidance.  A digital image was captured and placed the patient's chart.  A 5 French sheath was inserted into the left common femoral artery using standard Seldinger technique.  A 5 French pigtail catheters placed the distal abdominal aorta.  Distal abdominal aortography and bilateral iliac angiography were performed.  On the pigtail was used for the entirety of the case.  Retroaortic pressures  monitored during the case. Angiographic Data: 1: Abdominal aortogram-the renal arteries widely patent.  The infrarenal abdominal aorta was fluoroscopically heavily calcified. 2: Left lower extremity-the left common and extra iliac arteries widely patent 3: Right lower extremity-there was 70% segmental calcified right external iliac artery stenosis followed by 99% fairly focal calcified distal right common femoral artery stenosis.   Ms. Fahey has severely calcified right external and common femoral artery stenosis.  We will proceed with orbital atherectomy followed by PTA plus or minus covered stenting. Procedure Description: Contralateral access was obtained with a crossover  catheter, Glidewire, 035 Amplatz wire and a 7 [place] multipurpose 45 cm destination sheath.  Patient received a total of 90 units of heparin with an ACT of 327.  A total of 150 cc of contrast was administered to the patient. I crossed the subtotally occluded calcified distal right common femoral artery stenosis with an 018 6 gm Zilient  wire through an 018 quick cross endhole catheter.  I then exchanged for an 014/017 Viper wire.  I initially used a 1.25 micro bur to create a channel through the subtotally occluded highly calcified distal right common femoral artery of 220,000 RPMs.  I then switched to a 2 mm solid And perform multiple passes at 120,000 RPMs.  I did administer intra-arterial nitroglycerin.  I then exchanged back for the Zilient  wire and performed PTA with a 5 mm x 4 cm Sterling 018 over-the-wire balloon throughout the entire treated segment.  The common femoral lesion did appear somewhat "shaggy" I elected to cover this with a 6 mm x 25 mm long Viabahn covered stent postdilated with a 5 mm x 2 cm balloon resulting reduction of a 99% calcified distal right common femoral artery stenosis to 0% residual 70% calcified right external iliac artery stenosis less than 20% residual.  The patient tolerated the procedure well.  The destination sheath was then exchanged over the [facility] wire for a short 7 [place] sheath which was secured in place.  The patient left lab in stable condition.  She did receive 300 mg of p.o. Plavix at the end of the case. Final Impression: Successful orbital atherectomy followed by PTA and covered stenting of a subtotally occluded highly calcified distal right common femoral artery stenosis as well as a highly calcified segmental moderately stenosed right external iliac artery stenosis in the setting of lifestyle limiting claudication.  The patient will be reheparinized 8 hours after sheath removal without a bolus.  She can be treated with Plavix and warfarin alone.  She had Lovenox  bridging prior to the procedure and will be reasonable to perform Lovenox bridging at discharge until she is therapeutic.  We will get lower extremity arterial Doppler studies notify office next week and I will see her back the week after for follow-up. Quay Burow. MD, [facility] 11/27/2020 10:10 AM    ASSESMENT:   *   Reported black stool and heme positive stool this morning with brown, nonbloody stool observed within the hour. GI symptomatologies with several months of dysphagia and a week or so of anorexia/early satiety. Hx gastric adenocarcinoma w wedge resection 01/2016 but ultimately underwent Roux-en-Y gastrojejunostomy in 03/2016.  No worrisome signs or recurrence of cancer on latest EGD in 04/2017.  *    Normocytic anemia.  *    TAVR 10/30/2019  *     11/27/2020 atherectomy, PTA, stenting of subtotally occluded, highly calcified distal R CFA stenosis.  *   Chronic diarrhea for many years.  PLAN   *   Made switch off of famotidine and onto Protonix 40 mg po bid. continue AC/at bedtime Carafate which she has been taking many years.  *    Dr. Carlean Purl reviewed the case.  He is fine with sending the patient home on Plavix.  He will contact the patient/her family with an appointment for office follow-up next week along w recheck of her labs (CBC) Has follow-up with Dr. Donnella Bi on 12/10/2020.    *   Patient's 2 daughters were in the room.  I spoke with them and the patient.  They were advised to return patient to the ED if she developed recurrent persistent black or bloody stools, was vomiting, was weak, dizzy.  *   Just a reminder the patient is a Jehov[employer]'s Witness and declines transfusion with any blood products.    Azucena Freed  11/28/2020, 2:02 PM Phone [phone]

## 2020-11-28 NOTE — Discharge Summary (Addendum)
The patient has been seen in conjunction with Vin Bhagat, PAC. All aspects of care have been considered and discussed. The patient has been personally interviewed, examined, and all clinical data has been reviewed.   She is status post right iliac and common femoral artery intervention followed by coated stent via the left femoral artery.  Surprisingly, hemoglobin this a.m. is around 9 compared with 13.2, 3 days prior to admission.  The patient had melena after starting Lovenox bridge after discontinuing Coumadin.  Hemoglobin demonstrated to be stable on the day of discharge.  Drs. Shawna Orleans, and [name] were made aware of the situation.  The low hemoglobin will be followed up by Dr. Carlean Purl in the coming week.  The patient is instructed to call if recurrent melena.  Coumadin nor Lovenox will be given until reinstituting anticoagulation is felt to be safe.  Left femoral cath site is unremarkable.  There is no back pain or hematoma to suggest local bleeding.  Discharge today with close follow-up as arranged.  Discharge Summary    Patient ID: [name] MRN: [id number]; DOB: 06/24/1934  Admit date: 11/27/2020 Discharge date: 11/28/2020  PCP:  [name], [place]  Cardiologist:  [name], MD  Electrophysiologist:  Thompson Grayer, MD  PV: Dr. Gwenlyn Found   Discharge Diagnoses    Principal Problem:   PAD (peripheral artery disease) [facility]) Active Problems:   Atrial fibrillation ([place]) Chadsvasc 5   Tachycardia-bradycardia syndrome ([place])   Essential hypertension   Adenocarcinoma of stomach ([employer])   Refusal of blood transfusions as patient is Jehovah's Witness   S/P TAVR (transcatheter aortic valve replacement)   Claudication in peripheral vascular disease ([place])   GI bleed   Diagnostic Studies/Procedures     ABDOMINAL AORTOGRAM W/LOWER EXTREMITY  PERIPHERAL VASCULAR ATHERECTOMY  PERIPHERAL VASCULAR INTERVENTION   Procedures  Performed: 1. Ultrasound-guided left common femoral access 2. Abdominal aortogram/bilateral iliac angiogram 3. Contralateral access (secondary catheter placement) 4. Orbital atherectomy right external iliac artery and common femoral artery 5. PTA right extra iliac artery and common femoral artery 6. Viabahn covered stenting right common femoral artery.  Angiographic Data:   1: Abdominal aortogram-the renal arteries widely patent. The infrarenal abdominal aorta was fluoroscopically heavily calcified. 2: Left lower extremity-the left common and extra iliac arteries widely patent 3: Right lower extremity-there was 70% segmental calcified right external iliac artery stenosis followed by 99% fairly focal calcified distal right common femoral artery stenosis.  IMPRESSION:Ms. Carles has severely calcified right external and common femoral artery stenosis. We will proceed with orbital atherectomy followed by PTA plus or minus covered stenting.  Procedure Description: Contralateral access was obtained with a crossover catheter, Glidewire, 035 Amplatz wire and a 7 [place] multipurpose 45 cm destination sheath. Patient received a total of 90 units of heparin with an ACT of 327. A total of 150 cc of contrast was administered to the patient.  I crossed the subtotally occluded calcified distal right common femoral artery stenosis with an 018 6 gm Zilient wire through an 018 quick cross endhole catheter. I then exchanged for an 014/017 Viper wire. I initially used a 1.25 micro bur to create a channel through the subtotally occluded highly calcified distal right common femoral artery of 220,000 RPMs. I then switched to a 2 mm solid  And perform multiple passes at 120,000 RPMs. I did administer intra-arterial nitroglycerin. I then exchanged back for the Zilient wire and performed PTA with a 5 mm x 4  cm  Sterling 018 over-the-wire balloon throughout the entire treated segment. The common femoral lesion did appear somewhat "shaggy" I elected to cover this with a 6 mm x 25 mm long Viabahn covered stent postdilated with a 5 mm x 2 cm balloon resulting reduction of a 99% calcified distal right common femoral artery stenosis to 0% residual 70% calcified right external iliac artery stenosis less than 20% residual. The patient tolerated the procedure well. The destination sheath was then exchanged over the [facility] wire for a short 7 [place] sheath which was secured in place. The patient left lab in stable condition. She did receive 300 mg of p.o. Plavix at the end of the case.  Final Impression:Successful orbital atherectomy followed by PTA and covered stenting of a subtotally occluded highly calcified distal right common femoral artery stenosis as well as a highly calcified segmental moderately stenosed right external iliac artery stenosis in the setting of lifestyle limiting claudication. The patient will be reheparinized 8 hours after sheath removal without a bolus. She can be treated with Plavix and warfarin alone. She had Lovenox bridging prior to the procedure and will be reasonable to perform Lovenox bridging at discharge until she is therapeutic. We will get lower extremity arterial Doppler studies notify office next week and I will see her back the week after for follow-up.  History of Present Illness     [name] is a 87 y.o. female with hx of chronic diastolic CHF, paroxysmal atrial fibrillation, aortic stenosis s/p TAVR, tachy-brady syndrome s/p PPM, HTN, HLD, left central retinal artery occlusion from cholesterol plaque, stroke, bladder cancer, stomach cancer, solitary kidney with CKD stage 3, and anemia (Jehovah's witness) presented for outpatient PV intervention.   Hospital Course     Consultants: GI   1.  Peripheral arterial disease -s/p successful orbital atherectomy followed  by PTA and covered stenting of a subtotally occluded highly calcified distal right common femoral artery stenosis as well as a highly calcified segmental moderately stenosed right external iliac artery stenosis in the setting of lifestyle limiting claudication. -Plan to treat with Plavix and warfarin alone - Warfarin held as below  2.  Anemia - Hgb dropped from 13.2 (on 3/22) to 8.9 than to 9.1 in 6 hours -No hematoma at catheter site on bilateral groin -Patient with history of stomach and colon cancer and prior resection. She is  been dealing with chronic diarrhea however noted dark stool since on Lovenox for bridging (prior history of stroke as well as GI bleed). Takes iron supplements. Stool guaiac positive. Seen by GI who felt pt stable for discharge. Stopped Famotidine. Started Protonix 67m BID. Plan to see Dr. GCarlean Purlearly next with repeat CBC.  - Pt is Jehovah's witness  3. PAF -Patient is maintaining sinus rhythm. -Given positive stool guaiac plan to hold Coumadin and will not complete Lovenox bridge -Continue Plavix only given recent stent -Restart Coumadin as outpatient once okay with GI  Did the patient have an acute coronary syndrome (MI, NSTEMI, STEMI, etc) this admission?:  No                               Did the patient have a percutaneous coronary intervention (stent / angioplasty)?:  No.       Discharge Vitals Blood pressure (!) 125/51, pulse 81, temperature 98.2 F (36.8 C), temperature source Oral, resp. rate 17, height _0  (1.499 m), weight 41.5 kg, SpO2 96 %.  Filed Weights   11/27/20 0546  Weight: 41.5 kg    Labs & Radiologic Studies    CBC Recent Labs    11/28/20 0255 11/28/20 0959  WBC 4.9 5.6  HGB 8.9* 9.1*  HCT 27.0* 27.6*  MCV 97.8 97.9  PLT 127* 440*   Basic Metabolic Panel Recent Labs    11/28/20 0255  NA 139  K 3.9  CL 110  CO2 22  GLUCOSE 82  BUN 16  CREATININE 1.08*  CALCIUM 8.2*   _____________  PERIPHERAL VASCULAR  CATHETERIZATION  Result Date: 11/27/2020  [id number] LOCATION:  FACILITY[facility] PHYSICIAN: Quay Burow, M.D. 10-11-1933 DATE OF PROCEDURE:  11/27/2020 DATE OF DISCHARGE: PV Angiogram/Intervention History obtained from chart review.CLARISA DANSER is a 87 y.o. 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  She was referred by Dr. [name], her primary cardiologist for peripheral vascular evaluation because of right lower extremity lifestyle limiting claudication.  She did have a TAVR procedure via the left carotid approach 10/30/2019.  Otherwise she has a history of tachybradycardia syndrome status post permanent transvenous pacemaker insertion, hypertension, hyperlipidemia and a solitary kidney with CKD stage III.  She is a Restaurant manager, fast food.  She had a CTA performed in her lower extremities 10/16/2019 revealing diffuse calcified aortoiliac disease with significant right extra iliac artery stenosis which is confirmed by her Doppler studies performed 10/29/2020 revealing a right ABI of 0.77 with a right external iliac artery stenosis.  She does have lifestyle limiting right lower extremity claudication.  She presents today for outpatient peripheral angiography potential endovascular therapy.  Her warfarin was held and she was bridged with Lovenox.  INR today is 1.1. Pre Procedure Diagnosis: Peripheral arterial disease Post Procedure Diagnosis: Peripheral arterial disease Operators: Dr. Quay Burow Procedures Performed:  1.  Ultrasound-guided left common femoral access  2.  Abdominal aortogram/bilateral iliac angiogram  3.  Contralateral access (secondary catheter placement)  4.  Orbital atherectomy right external iliac artery and common femoral artery  5.  PTA right extra iliac artery and common femoral artery  6.  Viabahn covered stenting right common femoral artery.  PROCEDURE DESCRIPTION: The patient was brought to the second floor [place] [facility] cath lab in the the postabsorptive state. She was premedicated with IV Versed and fentanyl. Her right and left groin were prepped and shaved in usual sterile fashion. Xylocaine 1% was used for local anesthesia.  I initially gained access using ultrasound guidance in the right common femoral artery.  The wire would not pass much beyond the needle tip in a angiogram through the needle revealed a subtotally occluded highly calcified distal right common femoral artery.  I then abandon the right side and moved to the left side.  I accessed the left common femoral artery under ultrasound guidance.  A digital image was captured and placed the patient's chart.  A 5 French sheath was inserted into the left common femoral artery using standard Seldinger technique.  A 5 French pigtail catheters placed the distal abdominal aorta.  Distal abdominal aortography and bilateral iliac angiography were performed.  On the pigtail was used for the entirety of the case.  Retroaortic pressures monitored during the case. Angiographic Data: 1: Abdominal aortogram-the renal arteries widely patent.  The infrarenal abdominal aorta was fluoroscopically heavily calcified. 2: Left lower extremity-the left common and extra iliac arteries widely patent 3: Right  lower extremity-there was 70% segmental calcified right external iliac artery stenosis followed by 99% fairly focal calcified distal right common femoral artery stenosis.   Ms. Veley has severely calcified right external and common femoral artery stenosis.  We will proceed with orbital atherectomy followed by PTA plus or minus covered stenting. Procedure Description: Contralateral access was obtained with a crossover catheter, Glidewire, 035 Amplatz wire and a 7 [place] multipurpose 45 cm destination sheath.  Patient received a total of 90 units of heparin with an ACT of 327.  A total of 150 cc of  contrast was administered to the patient. I crossed the subtotally occluded calcified distal right common femoral artery stenosis with an 018 6 gm Zilient  wire through an 018 quick cross endhole catheter.  I then exchanged for an 014/017 Viper wire.  I initially used a 1.25 micro bur to create a channel through the subtotally occluded highly calcified distal right common femoral artery of 220,000 RPMs.  I then switched to a 2 mm solid And perform multiple passes at 120,000 RPMs.  I did administer intra-arterial nitroglycerin.  I then exchanged back for the Zilient  wire and performed PTA with a 5 mm x 4 cm [place] 018 over-the-wire balloon throughout the entire treated segment.  The common femoral lesion did appear somewhat "shaggy" I elected to cover this with a 6 mm x 25 mm long Viabahn covered stent postdilated with a 5 mm x 2 cm balloon resulting reduction of a 99% calcified distal right common femoral artery stenosis to 0% residual 70% calcified right external iliac artery stenosis less than 20% residual.  The patient tolerated the procedure well.  The destination sheath was then exchanged over the [facility] wire for a short 7 [place] sheath which was secured in place.  The patient left lab in stable condition.  She did receive 300 mg of p.o. Plavix at the end of the case. Final Impression: Successful orbital atherectomy followed by PTA and covered stenting of a subtotally occluded highly calcified distal right common femoral artery stenosis as well as a highly calcified segmental moderately stenosed right external iliac artery stenosis in the setting of lifestyle limiting claudication.  The patient will be reheparinized 8 hours after sheath removal without a bolus.  She can be treated with Plavix and warfarin alone.  She had Lovenox bridging prior to the procedure and will be reasonable to perform Lovenox bridging at discharge until she is therapeutic.  We will get lower extremity arterial Doppler studies notify  office next week and I will see her back the week after for follow-up. Quay Burow. MD, [facility] 11/27/2020 10:10 AM   Disposition   Pt is being discharged home today in good condition.  Follow-up Plans & Appointments     Follow-up Information    [name], MD Follow up.   Specialty: Gastroenterology Why: Early next week for GI bleed with blood work.  Call office if you do not hear Monday morning Contact information: [address] [place] [address] [phone]        [facility]. Go on 12/04/2020.   Specialty: Cardiology Why: _0  for ultrasound of leg Contact information: [address] [place] [phone]       Lorretta Harp, MD. Go on 12/10/2020.   Specialties: Cardiology, Radiology Why: _1 :30am  Contact information: [place] [address] [phone]  Discharge Instructions    Diet - low sodium heart healthy   Complete by: As directed    Discharge instructions   Complete by: As directed    No lifting over 5 lbs for 1 week. Keep procedure site clean & dry. If you notice increased pain, swelling, bleeding or pus, call/return!  You may shower, but no soaking baths/hot tubs/pools for 1 week.   Increase activity slowly   Complete by: As directed       Discharge Medications   Allergies as of 11/28/2020      Reactions   Amiodarone Other (See Comments)    Thyroid and liver and kidney problems   Adhesive [tape] Other (See Comments)   Tears skin, Please use "paper" tape   Hydrocodone Other (See Comments)   HALLLUCINATIONS   Other Other (See Comments)   NO BLOOD PRODUCTS -  Jehoval Witness   Reglan [metoclopramide] Hives, Itching, Rash   Remeron [mirtazapine] Other (See Comments)   Hallucinations and make pt loopy   Codeine Nausea And Vomiting   Morphine And Related Nausea And Vomiting   Requip [ropinirole Hcl] Other (See Comments)   Headache    Zinc Nausea Only      Medication List    STOP taking these medications   enoxaparin 40 MG/0.4ML injection Commonly known as: LOVENOX   famotidine 40 MG tablet Commonly known as: PEPCID   warfarin 2 MG tablet Commonly known as: COUMADIN     TAKE these medications   acetaminophen 500 MG tablet Commonly known as: TYLENOL Take 500 mg by mouth every 8 (eight) hours as needed for moderate pain.   ALPRAZolam 0.25 MG tablet Commonly known as: XANAX Take 0.5-1 tablets (0.125-0.25 mg total) by mouth 2 (two) times daily as needed for anxiety.   amoxicillin 500 MG tablet Commonly known as: AMOXIL Take 4 tablets (2,000 mg total) by mouth as directed. 1 hour prior to dental work including cleanings   clopidogrel 75 MG tablet Commonly known as: PLAVIX Take 1 tablet (75 mg total) by mouth daily with breakfast. Start taking on: November 29, 2020   Cyanocobalamin 5000 MCG Subl Place 5,000 mcg under the tongue daily.   erythromycin ophthalmic ointment Place 1 application into both eyes at bedtime as needed (stye).   ferrous sulfate 325 (65 FE) MG tablet Take 325 mg by mouth daily in the afternoon.   furosemide 20 MG tablet Commonly known as: LASIX Take 1 tablet (20 mg total) by mouth daily.   gabapentin 300 MG capsule Commonly known as: NEURONTIN TAKE 1 CAPSULE BY MOUTH THREE TIMES A DAY What changed:   how much to take  how to take this  when to take this  additional instructions   hydrocortisone 2.5 % rectal cream Commonly known as: ANUSOL-HC Place rectally 2 (two) times daily. What changed:   how much to take  when to take this  reasons to take this   levothyroxine 100 MCG tablet Commonly known as: SYNTHROID TAKE 1/2 TABLET DAILY FOR THYROID. What changed:   how much to take  how to take this  when to take this   loperamide 2 MG tablet Commonly known as: IMODIUM A-D Take up to 12 tablets /day as needed for  for Diarrhea What changed:   how much to  take  how to take this  when to take this  additional instructions   loratadine 10 MG tablet Commonly known as: CLARITIN Take 10 mg by mouth at bedtime.   losartan  50 MG tablet Commonly known as: Cozaar Take 0.5 tablets (25 mg total) by mouth 2 (two) times daily.   metoprolol succinate 50 MG 24 hr tablet Commonly known as: TOPROL-XL Take 1 tablet (50 mg total) by mouth in the morning and at bedtime.   MYLANTA PO Take 20 mLs by mouth every evening.   nitroGLYCERIN 0.4 MG SL tablet Commonly known as: NITROSTAT Dissolve 1 tablet under tongue every 5 minutes if needed for Angina What changed:   how much to take  how to take this  when to take this  reasons to take this   ondansetron 8 MG disintegrating tablet Commonly known as: ZOFRAN-ODT Dissolve 1 tablet under tongue every 6 to 8 hours  3 x /day if needed for Nausea or Vomitting What changed:   how much to take  how to take this  when to take this  reasons to take this   pantoprazole 40 MG tablet Commonly known as: PROTONIX Take 1 tablet (40 mg total) by mouth 2 (two) times daily. Further refills by PCP   potassium chloride 10 MEQ tablet Commonly known as: KLOR-CON Take 10 mEq by mouth daily.   pravastatin 20 MG tablet Commonly known as: Pravachol Take 1 tablet (20 mg total) by mouth daily.   simethicone 80 MG chewable tablet Commonly known as: MYLICON Chew 80 mg by mouth every 6 (six) hours as needed for flatulence.   sucralfate 1 g tablet Commonly known as: CARAFATE Take 1 g by mouth in the morning, at noon, and at bedtime.   Systane Complete 0.6 % Soln Generic drug: Propylene Glycol Place 1 drop into both eyes 3 (three) times daily as needed (irritation/dry eyes.).   tobramycin-dexamethasone ophthalmic solution Commonly known as: TOBRADEX Place 1 drop into both eyes daily as needed (stye).   triamcinolone 0.1 % Commonly known as: KENALOG Apply 1 application topically 2 (two) times daily  as needed (rash/skin irritation.).   verapamil 120 MG CR tablet Commonly known as: CALAN-SR TAKE 1 TABLET 2 X /DAY WITH A MEAL FOR BP & HEART RHYTHM What changed: See the new instructions.   vitamin C 1000 MG tablet Take 1,000 mg by mouth daily.   Vitamin D-3 125 MCG (5000 UT) Tabs Take 5,000 Units by mouth 3 (three) times daily.          Outstanding Labs/Studies   CBC by GI early next week   Duration of Discharge Encounter   Greater than 30 minutes including physician time.  Mahalia Longest [place], PA 11/28/2020, 3:42 PM

## 2020-12-01 ENCOUNTER — Telehealth: Payer: Self-pay | Admitting: Cardiovascular Disease

## 2020-12-01 ENCOUNTER — Telehealth: Payer: Self-pay

## 2020-12-01 NOTE — Telephone Encounter (Signed)
Pts daughter calling Cottie Banda [phone].... to report that since the pts vascular procedure on 11/27/20 the pt has had some right leg numbness. No edema or color change and she can still walk on it.  She is also very worried about her extreme lethargy... she is also not eating or drinking. No fever, no symptoms of CVA.... I advised her that she needs to eat and drink well but she says the pt is very nauseated. Her BP 143/64 and HR 79.   She is urinating without difficulty.  She will continue to monitor.. she is seeing Dr. Carlean Purl for her low HGB in the hospital.   She will also call her PCP re: her nausea.   I will forward to Dr. Gwenlyn Found for hie review.

## 2020-12-01 NOTE — Telephone Encounter (Signed)
Pt called stated that they had stopped warfarin and no longer needed an appt

## 2020-12-01 NOTE — Telephone Encounter (Signed)
Patient's daughter is calling to say that since her mother surgical procedure on Thursday her mom right leg is numb and seem more fatigue since the surgery. Its has the patient's daughter very worry.

## 2020-12-01 NOTE — Telephone Encounter (Signed)
Patient's daughter called back to say that the other day will be unavaiable to please called other sis Dawn at [phone].

## 2020-12-02 ENCOUNTER — Other Ambulatory Visit (HOSPITAL_COMMUNITY): Payer: Self-pay | Admitting: Cardiovascular Disease

## 2020-12-02 DIAGNOSIS — Z95828 Presence of other vascular implants and grafts: Secondary | ICD-10-CM

## 2020-12-03 ENCOUNTER — Other Ambulatory Visit (INDEPENDENT_AMBULATORY_CARE_PROVIDER_SITE_OTHER): Payer: Medicare Other

## 2020-12-03 ENCOUNTER — Ambulatory Visit: Payer: Medicare Other | Admitting: Internal Medicine

## 2020-12-03 ENCOUNTER — Encounter: Payer: Self-pay | Admitting: Internal Medicine

## 2020-12-03 VITALS — BP 124/52 | HR 56 | Ht 59.0 in | Wt 97.0 lb

## 2020-12-03 DIAGNOSIS — K648 Other hemorrhoids: Secondary | ICD-10-CM

## 2020-12-03 DIAGNOSIS — D62 Acute posthemorrhagic anemia: Secondary | ICD-10-CM | POA: Diagnosis not present

## 2020-12-03 DIAGNOSIS — Z531 Procedure and treatment not carried out because of patient's decision for reasons of belief and group pressure: Secondary | ICD-10-CM | POA: Diagnosis not present

## 2020-12-03 DIAGNOSIS — R1319 Other dysphagia: Secondary | ICD-10-CM

## 2020-12-03 DIAGNOSIS — Z86010 Personal history of colonic polyps: Secondary | ICD-10-CM | POA: Diagnosis not present

## 2020-12-03 DIAGNOSIS — S301XXA Contusion of abdominal wall, initial encounter: Secondary | ICD-10-CM

## 2020-12-03 DIAGNOSIS — R195 Other fecal abnormalities: Secondary | ICD-10-CM

## 2020-12-03 LAB — CBC WITH DIFFERENTIAL/PLATELET
Basophils Absolute: 0 10*3/uL (ref 0.0–0.1)
Basophils Relative: 0.4 % (ref 0.0–3.0)
Eosinophils Absolute: 0.2 10*3/uL (ref 0.0–0.7)
Eosinophils Relative: 3.5 % (ref 0.0–5.0)
HCT: 32.3 % — ABNORMAL LOW (ref 36.0–46.0)
Hemoglobin: 10.7 g/dL — ABNORMAL LOW (ref 12.0–15.0)
Lymphocytes Relative: 17.4 % (ref 12.0–46.0)
Lymphs Abs: 1.2 10*3/uL (ref 0.7–4.0)
MCHC: 33.1 g/dL (ref 30.0–36.0)
MCV: 97.5 fl (ref 78.0–100.0)
Monocytes Absolute: 0.6 10*3/uL (ref 0.1–1.0)
Monocytes Relative: 9.5 % (ref 3.0–12.0)
Neutro Abs: 4.7 10*3/uL (ref 1.4–7.7)
Neutrophils Relative %: 69.2 % (ref 43.0–77.0)
Platelets: 245 10*3/uL (ref 150.0–400.0)
RBC: 3.32 Mil/uL — ABNORMAL LOW (ref 3.87–5.11)
RDW: 15.1 % (ref 11.5–15.5)
WBC: 6.8 10*3/uL (ref 4.0–10.5)

## 2020-12-03 LAB — FERRITIN: Ferritin: 135.7 ng/mL (ref 10.0–291.0)

## 2020-12-03 NOTE — Patient Instructions (Signed)

## 2020-12-03 NOTE — Telephone Encounter (Signed)
Spoke with the patient's daughter who states they had just called earlier in the week because the patient was very fatigued and weak over the weekend. She states that the patient is doing much better and her leg looks good. They do not have any concerns at this time.

## 2020-12-03 NOTE — Progress Notes (Signed)
[name] 87 y.o. 1933-11-18 [id number]  Assessment & Plan:   Encounter Diagnoses  Name Primary?  . Acute blood loss anemia Yes  . Hematoma of groin, initial encounter   . Esophageal dysphagia   . Hx of adenomatous colonic polyps and colon cancer   . Internal hemorrhoids   . Heme positive stool   . Refusal of blood transfusions as patient is Jehovah's Witness    The patient does not corroborate a history of melena or significant GI bleeding.  I think given the appearances of her thighs and groin most of her blood loss is hematoma in my opinion.  Perhaps she had some transient GI bleeding.  She is going to be heme positive chronically given hemorrhoids.  She has chronically dark stools on iron as well.  Perhaps that is where the confusion about possible melena came about.  She is not a good candidate to have any type of endoscopic procedures right now given her age and comorbidities and in particular her weakness with the anemia.  I think we should consider a follow-up when she is stronger.  She has a little bit of dysphagia and she had a large polyp that was malignant removed in 2 sessions last year.  This should be followed up ideally.  As far as restarting blood thinners I do not have a problem with that we will defer to cardiology.  I would prefer her not be on warfarin and Plavix or at least if so just a limited time.  But I would again defer to cardiology with respect to the vascular status and the need to keep arteries patent.  CBC Latest Ref Rng & Units 12/03/2020 11/28/2020 11/28/2020  WBC 4.0 - 10.5 K/uL 6.8 5.6 4.9  Hemoglobin 12.0 - 15.0 g/dL 10.7(L) 9.1(L) 8.9(L)  Hematocrit 36.0 - 46.0 % 32.3(L) 27.6(L) 27.0(L)  Platelets 150.0 - 400.0 K/uL 245.0 141(L) 127(L)    Lab Results  Component Value Date   FERRITIN 135.7 12/03/2020   Hemoglobin is improved as can see.  Ferritin is 135.  She takes ferrous sulfate and will continue.  I will see her in 2 months to  reassess sooner if needed.  I appreciate the opportunity to care for this patient. CC: [name], MD Dr. Quay Burow Dr. [name]  Subjective:   Chief Complaint: Anemia and heme positive  HPI [name] is here with her daughter for follow-up after having decline in hemoglobin after peripheral vascular interventions last week, and she had a heme positive stool so the thought was maybe she was having GI blood loss.  She had right iliac and common femoral artery intervention followed by coated stent via the left femoral artery.  The cardiologist rounding indicated she did not have signs of a significant hematoma but the patient and her daughter indicates she has significant bruising in both groins and thighs.  There was a report of melena after Lovenox bridge but the patient denies this.  She says her stools have been dark green.  She has not seen any rectal bleeding or hematochezia or melena.  She does have a little bit of dysphagia to pills and solid foods at times and spits up some.  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  I had incompletely removed the ascending colon cancer and she had a follow-up colonoscopy with removal  of that and several other polyps in September 2021 by my partner Dr. Fuller Plan while I was out with an injury.  She has not yet had follow-up of that large polypectomy site again.  Last EGD August 2021 with a normal postoperative Roux-en-Y gastrojejunostomy anatomy.  She is struggling right now with orthopnea and dyspnea on exertion.  She feels very weak.   She was continued on Plavix and treated with Lovenox and warfarin was held and is still being held.  Waiting to see cardiology again next week.  She does report that the heaviness she was having in her legs seems to be gone though she cannot really test  it because she is so weak from the anemia she has. Allergies  Allergen Reactions  . Amiodarone Other (See Comments)     Thyroid and liver and kidney problems  . Adhesive [Tape] Other (See Comments)    Tears skin, Please use "paper" tape  . Hydrocodone Other (See Comments)    HALLLUCINATIONS  . Other Other (See Comments)    NO BLOOD PRODUCTS -  Jehoval Witness  . Reglan [Metoclopramide] Hives, Itching and Rash  . Remeron [Mirtazapine] Other (See Comments)    Hallucinations and make pt loopy  . Codeine Nausea And Vomiting  . Morphine And Related Nausea And Vomiting  . Requip [Ropinirole Hcl] Other (See Comments)    Headache   . Zinc Nausea Only   Current Meds  Medication Sig  . acetaminophen (TYLENOL) 500 MG tablet Take 500 mg by mouth every 8 (eight) hours as needed for moderate pain.  [name] ALPRAZolam (XANAX) 0.25 MG tablet Take 0.5-1 tablets (0.125-0.25 mg total) by mouth 2 (two) times daily as needed for anxiety.  . Alum & Mag Hydroxide-Simeth (MYLANTA PO) Take 20 mLs by mouth every evening.   [name] amoxicillin (AMOXIL) 500 MG tablet Take 4 tablets (2,000 mg total) by mouth as directed. 1 hour prior to dental work including cleanings  . Ascorbic Acid (VITAMIN C) 1000 MG tablet Take 1,000 mg by mouth daily.  . Cholecalciferol (VITAMIN D-3) 5000 units TABS Take 5,000 Units by mouth 3 (three) times daily.   . clopidogrel (PLAVIX) 75 MG tablet Take 1 tablet (75 mg total) by mouth daily with breakfast.  . Cyanocobalamin 5000 MCG SUBL Place 5,000 mcg under the tongue daily.   [name] erythromycin ophthalmic ointment Place 1 application into both eyes at bedtime as needed (stye).   . ferrous sulfate 325 (65 FE) MG tablet Take 325 mg by mouth daily in the afternoon.  . furosemide (LASIX) 20 MG tablet Take 1 tablet (20 mg total) by mouth daily.  [name] gabapentin (NEURONTIN) 300 MG capsule TAKE 1 CAPSULE BY MOUTH THREE TIMES A DAY (Patient taking differently: Take 300 mg by mouth 2 (two) times daily.)  .  hydrocortisone (ANUSOL-HC) 2.5 % rectal cream Place rectally 2 (two) times daily. (Patient taking differently: Place 1 application rectally 2 (two) times daily as needed for hemorrhoids.)  . levothyroxine (SYNTHROID) 100 MCG tablet TAKE 1/2 TABLET DAILY FOR THYROID. (Patient taking differently: Take 50 mcg by mouth daily before breakfast. TAKE 1/2 TABLET DAILY FOR THYROID.)  . loperamide (IMODIUM A-D) 2 MG tablet Take up to 12 tablets /day as needed for  for Diarrhea (Patient taking differently: Take 2 mg by mouth at bedtime.)  . loratadine (CLARITIN) 10 MG tablet Take 10 mg by mouth at bedtime.  [name] losartan (COZAAR) 50 MG tablet Take 0.5 tablets (25 mg total) by mouth 2 (two) times  daily.  . metoprolol succinate (TOPROL-XL) 50 MG 24 hr tablet Take 1 tablet (50 mg total) by mouth in the morning and at bedtime. (Patient taking differently: Take 25 mg by mouth in the morning and at bedtime.)  . nitroGLYCERIN (NITROSTAT) 0.4 MG SL tablet Dissolve 1 tablet under tongue every 5 minutes if needed for Angina (Patient taking differently: Place 0.4 mg under the tongue every 5 (five) minutes as needed for chest pain. Dissolve 1 tablet under tongue every 5 minutes if needed for Angina)  . ondansetron (ZOFRAN-ODT) 8 MG disintegrating tablet Dissolve 1 tablet under tongue every 6 to 8 hours  3 x /day if needed for Nausea or Vomitting (Patient taking differently: Take 8 mg by mouth every 6 (six) hours as needed for vomiting or nausea. Dissolve 1 tablet under tongue every 6 to 8 hours  3 x /day if needed for Nausea or Vomitting)  . pantoprazole (PROTONIX) 40 MG tablet Take 1 tablet (40 mg total) by mouth 2 (two) times daily. Further refills by PCP  . potassium chloride (KLOR-CON) 10 MEQ tablet Take 10 mEq by mouth daily.  . pravastatin (PRAVACHOL) 20 MG tablet Take 1 tablet (20 mg total) by mouth daily.  [name] Propylene Glycol (SYSTANE COMPLETE) 0.6 % SOLN Place 1 drop into both eyes 3 (three) times daily as needed  (irritation/dry eyes.).  [name] simethicone (MYLICON) 80 MG chewable tablet Chew 80 mg by mouth every 6 (six) hours as needed for flatulence.  . sucralfate (CARAFATE) 1 g tablet Take 1 g by mouth in the morning, at noon, and at bedtime.  [name] tobramycin-dexamethasone (TOBRADEX) ophthalmic solution Place 1 drop into both eyes daily as needed (stye).   [name] triamcinolone cream (KENALOG) 0.1 % Apply 1 application topically 2 (two) times daily as needed (rash/skin irritation.).   [name] verapamil (CALAN-SR) 120 MG CR tablet TAKE 1 TABLET 2 X /DAY WITH A MEAL FOR BP & HEART RHYTHM (Patient taking differently: Take 120 mg by mouth 2 (two) times daily. Take 1 tablet 2 x /day with a meal for BP & Heart Rhythm)   Current Facility-Administered Medications for the 12/03/20 encounter (Office Visit) with [name], MD  Medication  . sodium chloride flush (NS) 0.9 % injection 3 mL   Past Medical History:  Diagnosis Date  . Adenocarcinoma of stomach ([place]) 11/11/2015   gastric mass on egd,   . Anal fissure   . Anemia    hx 3/17 iron infusion, on aranesp and injected B12 since 11/2015.   [name] Anxiety   . Aortic root dilatation ([place])   . Arthritis    oa  . Bladder cancer ([place])   . Blind left eye 2015   partial blind in left eye due to stroke   . 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 CAD (coronary artery disease)   . Cataracts, bilateral   . Chronic kidney disease    only has one kidney rt lft rem 03 ca     dr. Risa Grill, bladder cancer 05-04-2018  . Elevated LFTs    hepatic steatosis, none recent  . Endometrial cancer ([place])   . Gastroparesis   . GERD (gastroesophageal reflux disease)   . Hearing loss    wears hearing aids  . Heart murmur   . History of uterine cancer 1980s   treated with hysterectomy, external radiation and radiation seed implants.   [name] HTN (hypertension)   . Hyperlipemia   . Hypothyroidism   . Iron deficiency anemia due to chronic  blood loss 11/24/2015  . Neuropathy    right leg  . Osteomyelitis ([place]) as  child  . PAF (paroxysmal atrial fibrillation) ([place])   . Presence of permanent cardiac pacemaker   . Refusal of blood transfusions as patient is Jehovah's Witness   . Restless leg syndrome   . 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 Severe aortic stenosis   . Stroke [facility]) 2015   partial stroke left legally blind; left eye   . Tachycardia-bradycardia syndrome ([place])    s/p PPM by Dr Doreatha Lew (MDT) 07/03/10  . 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 Walker as ambulation aid   . Wears glasses   . Wears partial dentures    upper   Past Surgical History:  Procedure Laterality Date  . ABDOMINAL AORTOGRAM W/LOWER EXTREMITY Bilateral 11/27/2020   Procedure: ABDOMINAL AORTOGRAM W/LOWER EXTREMITY;  Surgeon: Lorretta Harp, MD;  Location: [place] CV LAB;  Service: Cardiovascular;  Laterality: Bilateral;  limited to iliacs  . CARDIAC CATHETERIZATION  2008  . CARDIOVERSION N/A 12/05/2019   Procedure: CARDIOVERSION;  Surgeon: Elouise Munroe, MD;  Location: [facility] ENDOSCOPY;  Service: Cardiovascular;  Laterality: N/A;  . CARDIOVERSION N/A 06/13/2020   Procedure: CARDIOVERSION;  Surgeon: Buford Dresser, MD;  Location: [facility] ENDOSCOPY;  Service: Cardiovascular;  Laterality: N/A;  . CHOLECYSTECTOMY  1970s  . COLONOSCOPY N/A 11/11/2015   Procedure: COLONOSCOPY;  Surgeon: [name], MD;  Location: [facility] ENDOSCOPY;  Service: Endoscopy;  Laterality: N/A;  . CYSTOSCOPY W/ RETROGRADES Right 05/01/2018   Procedure: CYSTOSCOPY WITH RIGHT RETROGRADE PYELOGRAM;  Surgeon: Lucas Mallow, MD;  Location: [facility] ORS;  Service: Urology;  Laterality: Right;  . CYSTOSCOPY W/ RETROGRADES Right 07/21/2018   Procedure: CYSTOSCOPY WITH RIGHT RETROGRADE PYELOGRAM;  Surgeon: Alexis Frock, MD;  [facility];  Service: Urology;  Laterality: Right;  . ESOPHAGOGASTRODUODENOSCOPY N/A  11/11/2015   Procedure: ESOPHAGOGASTRODUODENOSCOPY (EGD);  Surgeon: [name], MD;  Location: [facility];  Service: Endoscopy;  Laterality: N/A;  . ESOPHAGOGASTRODUODENOSCOPY N/A 02/05/2016   Procedure: ESOPHAGOGASTRODUODENOSCOPY (EGD);  Surgeon: Ladene Artist, MD;  Location: [facility] ENDOSCOPY;  Service: Endoscopy;  Laterality: N/A;  . ESOPHAGOGASTRODUODENOSCOPY N/A 04/02/2016   Procedure: ESOPHAGOGASTRODUODENOSCOPY (EGD);  Surgeon: [name], MD;  Location: [facility];  Service: Endoscopy;  Laterality: N/A;  . ESOPHAGOGASTRODUODENOSCOPY (EGD) WITH PROPOFOL N/A 04/06/2017   Procedure: ESOPHAGOGASTRODUODENOSCOPY (EGD) WITH PROPOFOL;  Surgeon: [name], MD;  [facility];  Service: Endoscopy;  Laterality: N/A;  . EYE SURGERY Bilateral    remove cataracts  . GASTRECTOMY N/A 01/15/2016   Procedure: OPEN PARTIAL GASTRECTOMY;  Surgeon: Stark Klein, MD;  Location: [place];  Service: General;  Laterality: N/A;  . GASTRECTOMY Left 02/24/2016   Procedure: OPEN J TUBE;  Surgeon: Stark Klein, MD;  Location: [place];  Service: General;  Laterality: Left;  [name] GASTROJEJUNOSTOMY N/A 05/18/2016   Procedure: ROUX EN [place];  Surgeon: Stark Klein, MD;  Location: [place];  Service: General;  Laterality: N/A;  . I & D KNEE WITH POLY EXCHANGE Right 12/17/2013   Procedure: IRRIGATION AND DEBRIDEMEN RIGHT TOTAL  KNEE WITH POLY EXCHANGE;  Surgeon: Mauri Pole, MD;  [facility];  Service: Orthopedics;  Laterality: Right;  . IR GENERIC HISTORICAL  06/17/2016   IR RADIOLOGIST EVAL & MGMT 06/17/2016 Ascencion Dike, PA-C [facility] INTERV RAD  .  LAPAROSCOPY N/A 01/15/2016   Procedure: LAPAROSCOPY DIAGNOSTIC;  Surgeon: Stark Klein, MD;  Location: [place];  Service: General;  Laterality: N/A;  . LYSIS OF ADHESION  05/18/2016   Procedure: LYSIS OF ADHESION;  Surgeon: Stark Klein, MD;  Location: [place];  Service: General;;  . NEPHRECTOMY Left    renal cell cancer  . PACEMAKER INSERTION  07/04/2011    MDT PPM implanted by Dr Doreatha Lew for tachy/brady; managed by Dr Thompson Grayer   . PATELLAR TENDON REPAIR Right 03/06/2014   Procedure: AVULSION WITH PATELLA TENDON REPAIR;  Surgeon: Mauri Pole, MD;  [facility];  Service: Orthopedics;  Laterality: Right;  . PATENT DUCTUS ARTERIOUS REPAIR  ~ 1949  . PERIPHERAL VASCULAR ATHERECTOMY Right 11/27/2020   Procedure: PERIPHERAL VASCULAR ATHERECTOMY;  Surgeon: Lorretta Harp, MD;  Location: [place] CV LAB;  Service: Cardiovascular;  Laterality: Right;  ext iliac  . PERIPHERAL VASCULAR INTERVENTION Right 11/27/2020   Procedure: PERIPHERAL VASCULAR INTERVENTION;  Surgeon: Lorretta Harp, MD;  Location: [place] CV LAB;  Service: Cardiovascular;  Laterality: Right;  ext iliac  . RIGHT/LEFT HEART CATH AND CORONARY ANGIOGRAPHY N/A 10/09/2019   Procedure: RIGHT/LEFT HEART CATH AND CORONARY ANGIOGRAPHY;  Surgeon: [name], [name], MD;  Location: [place] CV LAB;  Service: Cardiovascular;  Laterality: N/A;  . TEE WITHOUT CARDIOVERSION N/A 10/30/2019   Procedure: TRANSESOPHAGEAL ECHOCARDIOGRAM (TEE);  Surgeon: Burnell Blanks, MD;  Location: [place];  Service: Open Heart Surgery;  Laterality: N/A;  . TEE WITHOUT CARDIOVERSION N/A 06/13/2020   Procedure: TRANSESOPHAGEAL ECHOCARDIOGRAM (TEE);  Surgeon: Buford Dresser, MD;  [facility] ENDOSCOPY;  Service: Cardiovascular;  Laterality: N/A;  . TOTAL ABDOMINAL HYSTERECTOMY  85  . TOTAL HIP REVISION Right 2009   initial replacment surg  . TOTAL KNEE ARTHROPLASTY  02/07/2012   Procedure: TOTAL KNEE ARTHROPLASTY;  Surgeon: Mauri Pole, MD;  [facility];  Service: Orthopedics;  Laterality: Right;  . TOTAL KNEE REVISION Right 07/29/2014   Procedure: RIGHT KNEE REVISION OF PREVIOUS REPAIR EXTENSOR MECHANISM;  Surgeon: Mauri Pole, MD;  [facility];  Service: Orthopedics;  Laterality: Right;  . TRANSCATHETER AORTIC VALVE REPLACEMENT, CAROTID Left 10/30/2019   TRANSCATHETER  AORTIC VALVE REPLACEMENT, LEFT CAROTID (Left Neck)   . TRANSCATHETER AORTIC VALVE REPLACEMENT, CAROTID Left 10/30/2019   Procedure: TRANSCATHETER AORTIC VALVE REPLACEMENT, LEFT CAROTID;  Surgeon: Burnell Blanks, MD;  Location: [place];  Service: Open Heart Surgery;  Laterality: Left;  . TRANSURETHRAL RESECTION OF BLADDER TUMOR N/A 07/21/2018   Procedure: TRANSURETHRAL RESECTION OF BLADDER TUMOR (TURBT);  Surgeon: Alexis Frock, MD;  [facility];  Service: Urology;  Laterality: N/A;  . TRANSURETHRAL RESECTION OF BLADDER TUMOR WITH MITOMYCIN-C N/A 05/01/2018   Procedure: TRANSURETHRAL RESECTION OF BLADDER TUMOR WITH POST OP INSTILLATION OF GEMCITABINE;  Surgeon: Lucas Mallow, MD;  [facility];  Service: Urology;  Laterality: N/A;  . WHIPPLE PROCEDURE N/A 05/18/2016   Procedure: EXPLORATORY LAPAROTOMY;  Surgeon: Stark Klein, MD;  Location: [place] OR;  Service: General;  Laterality: N/A;   Social History   Social History Narrative   01/01/19 Lives in [place] w/dgtr, [name].  Continues to work.   family history includes Colon cancer (age of onset: 60) in her mother; Heart attack in her father; Heart disease in her father, maternal grandmother, and mother; Stomach cancer in her brother.   Review of Systems As per HPI  Objective:   Physical Exam BP (!) 124/52   Pulse [name])  56   Ht 4\' 11"  (1.499 m)   Wt 97 lb (44 kg)   LMP  (LMP Unknown)   BMI 19.59 kg/m  Pleasant elderly white woman pale in no acute distress Unsteady gait (neuropathy) and some weakness requires assistance. Lungs are clear Heart sounds are normal S1-S2 I do not hear any rubs murmurs or gallops The abdomen is soft and nontender bowel sounds present The groins and thighs have the appearance as below in these pictures       Data reviewed includes hospitalization records from last week labs in the computer as well.

## 2020-12-04 ENCOUNTER — Other Ambulatory Visit (HOSPITAL_COMMUNITY): Payer: Self-pay | Admitting: Cardiovascular Disease

## 2020-12-04 ENCOUNTER — Ambulatory Visit (HOSPITAL_BASED_OUTPATIENT_CLINIC_OR_DEPARTMENT_OTHER)
Admission: RE | Admit: 2020-12-04 | Discharge: 2020-12-04 | Disposition: A | Discharge status: Home / Self Care | Payer: Medicare Other | Source: Ambulatory Visit | Attending: Cardiovascular Disease | Admitting: Cardiovascular Disease

## 2020-12-04 ENCOUNTER — Other Ambulatory Visit: Payer: Self-pay

## 2020-12-04 ENCOUNTER — Ambulatory Visit (HOSPITAL_COMMUNITY)
Admission: RE | Admit: 2020-12-04 | Discharge: 2020-12-04 | Disposition: A | Discharge status: Home / Self Care | Payer: Medicare Other | Source: Ambulatory Visit | Attending: Cardiology | Admitting: Cardiology

## 2020-12-04 DIAGNOSIS — I739 Peripheral vascular disease, unspecified: Secondary | ICD-10-CM

## 2020-12-04 DIAGNOSIS — Z95828 Presence of other vascular implants and grafts: Secondary | ICD-10-CM

## 2020-12-04 DIAGNOSIS — Z9862 Peripheral vascular angioplasty status: Secondary | ICD-10-CM

## 2020-12-08 ENCOUNTER — Ambulatory Visit (INDEPENDENT_AMBULATORY_CARE_PROVIDER_SITE_OTHER): Payer: Medicare Other

## 2020-12-08 ENCOUNTER — Encounter (HOSPITAL_COMMUNITY): Payer: Self-pay | Admitting: Cardiovascular Disease

## 2020-12-08 DIAGNOSIS — I4891 Unspecified atrial fibrillation: Secondary | ICD-10-CM

## 2020-12-09 LAB — CUP PACEART REMOTE DEVICE CHECK
Battery Impedance: 2956 Ohm
Battery Remaining Longevity: 19 mo
Battery Voltage: 2.74 V
Brady Statistic AP VP Percent: 0 %
Brady Statistic AP VS Percent: 28 %
Brady Statistic AS VP Percent: 0 %
Brady Statistic AS VS Percent: 71 %
Date Time Interrogation Session: 20220404122725
Implantable Lead Implant Date: 20111027
Implantable Lead Implant Date: 20111027
Implantable Lead Location: 753859
Implantable Lead Location: 753860
Implantable Lead Model: 4469
Implantable Lead Model: 4470
Implantable Lead Serial Number: 548229
Implantable Lead Serial Number: 686026
Implantable Pulse Generator Implant Date: 20111027
Lead Channel Impedance Value: 387 Ohm
Lead Channel Impedance Value: 476 Ohm
Lead Channel Pacing Threshold Amplitude: 0.375 V
Lead Channel Pacing Threshold Amplitude: 1.625 V
Lead Channel Pacing Threshold Pulse Width: 0.4 ms
Lead Channel Pacing Threshold Pulse Width: 0.4 ms
Lead Channel Setting Pacing Amplitude: 2 V
Lead Channel Setting Pacing Amplitude: 3.25 V
Lead Channel Setting Pacing Pulse Width: 0.46 ms
Lead Channel Setting Sensing Sensitivity: 2 mV

## 2020-12-10 ENCOUNTER — Ambulatory Visit (INDEPENDENT_AMBULATORY_CARE_PROVIDER_SITE_OTHER): Payer: Medicare Other | Admitting: Cardiovascular Disease

## 2020-12-10 ENCOUNTER — Other Ambulatory Visit: Payer: Self-pay

## 2020-12-10 ENCOUNTER — Encounter: Payer: Self-pay | Admitting: Cardiovascular Disease

## 2020-12-10 DIAGNOSIS — I739 Peripheral vascular disease, unspecified: Secondary | ICD-10-CM | POA: Diagnosis not present

## 2020-12-10 NOTE — Patient Instructions (Signed)
Medication Instructions:  Your physician recommends that you continue on your current medications as directed. Please refer to the Current Medication list given to you today.  *If you need a refill on your cardiac medications before your next appointment, please call your pharmacy*   Testing/Procedures: Your physician has requested that you have an abdominal aorta duplex. During this test, an ultrasound is used to evaluate the aorta. Allow 30 minutes for this exam. Do not eat after midnight the day before and avoid carbonated beverages  Your physician has requested that you have a lower extremity arterial duplex. This test is an ultrasound of the arteries in the legs. It looks at arterial blood flow in the legs. Allow one hour for Lower Arterial scans. There are no restrictions or special instructions  Your physician has requested that you have an ankle brachial index (ABI). During this test an ultrasound and blood pressure cuff are used to evaluate the arteries that supply the arms and legs with blood. Allow thirty minutes for this exam. There are no restrictions or special instructions.  These procedures are done at [facility]. 2nd Floor to be done in Oct. 2022.  Follow-Up: At [facility], you and your health needs are our priority.  As part of our continuing mission to provide you with exceptional heart care, we have created designated Provider Care Teams.  These Care Teams include your primary Cardiologist (physician) and Advanced Practice Providers (APPs -  Physician Assistants and Nurse Practitioners) who all work together to provide you with the care you need, when you need it.  We recommend signing up for the patient portal called "MyChart".  Sign up information is provided on this After Visit Summary.  MyChart is used to connect with patients for Virtual Visits (Telemedicine).  Patients are able to view lab/test results, encounter notes, upcoming appointments, etc.  Non-urgent  messages can be sent to your provider as well.   To learn more about what you can do with MyChart, go to [email or web address].    Your next appointment:   6 month(s)  The format for your next appointment:   In Person  Provider:   Quay Burow, MD

## 2020-12-10 NOTE — Assessment & Plan Note (Signed)
History of peripheral arterial disease status post orbital atherectomy, PTA and covered stenting with a 6 mm x 25 mm long Viabahn covered stent on 11/27/2020.  She had a 99% subtotally occluded calcified distal right external iliac artery stenosis.  She was on warfarin prior to that for A. fib.  She is now on Plavix.  She has diffuse ecchymosis down the medial aspects of both thighs.  At this point, I prefer her to be on Plavix for at least 3 months with her new stent.  They are hesitant to be on both.  I will discuss anticoagulation strategy with Dr. [name], her primary cardiologist.

## 2020-12-10 NOTE — Progress Notes (Signed)
12/10/2020 CAOILAINN SACKS   10-10-1933  [id number]  Primary Physician [name], MD Primary Cardiologist: Lorretta Harp MD Lupe Carney, [place]  HPI:  [name] is a 87 y.o.  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  She was referred by Dr. [name], her primary cardiologist for peripheral vascular evaluation because of right lower extremity lifestyle limiting claudication.  I last saw her in the office 11/07/2020. She did have a TAVR procedure via the left carotid approach 10/30/2019.  Otherwise she has a history of tachybradycardia syndrome status post permanent transvenous pacemaker insertion, hypertension, hyperlipidemia and a solitary kidney with CKD stage III.  She is a Restaurant manager, fast food.  She had a CTA performed in her lower extremities 10/16/2019 revealing diffuse calcified aortoiliac disease with significant right extra iliac artery stenosis which is confirmed by her Doppler studies performed 10/29/2020 revealing a right ABI of 0.77 with a right external iliac artery stenosis.   I performed peripheral angiography on her 11/27/2020 revealing a 99% calcified subtotally occluded distal right external iliac artery stenosis.  I performed orbital atherectomy, PTA and covered stenting using a Viabahn covered stent with an excellent angiographic result.  She was discharged home the following day.  She was on warfarin anticoagulation prior to that for A. fib.  Her claudication has completely resolved and her Doppler studies have normalized.  She does have diffuse ecchymosis to the medial aspect of both thighs.  She is on Plavix antiplatelet therapy for her recent stent and is hesitant to go back on warfarin at this time.  Current Meds  Medication Sig  . acetaminophen (TYLENOL) 500 MG tablet Take 500 mg by mouth every 8 (eight)  hours as needed for moderate pain.  [name] ALPRAZolam (XANAX) 0.25 MG tablet Take 0.5-1 tablets (0.125-0.25 mg total) by mouth 2 (two) times daily as needed for anxiety.  . Alum & Mag Hydroxide-Simeth (MYLANTA PO) Take 20 mLs by mouth every evening.   [name] amoxicillin (AMOXIL) 500 MG tablet Take 4 tablets (2,000 mg total) by mouth as directed. 1 hour prior to dental work including cleanings  . Ascorbic Acid (VITAMIN C) 1000 MG tablet Take 1,000 mg by mouth daily.  . Cholecalciferol (VITAMIN D-3) 5000 units TABS Take 5,000 Units by mouth 3 (three) times daily.   . clopidogrel (PLAVIX) 75 MG tablet Take 1 tablet (75 mg total) by mouth daily with breakfast.  . clopidogrel (PLAVIX) 75 MG tablet TAKE 1 TABLET (75 MG TOTAL) BY MOUTH DAILY WITH BREAKFAST.  [name] Cyanocobalamin 5000 MCG SUBL Place 5,000 mcg under the tongue daily.   [name] erythromycin ophthalmic ointment Place 1 application into both eyes at bedtime as needed (stye).   . ferrous sulfate 325 (65 FE) MG tablet Take 325 mg by mouth daily in the afternoon.  . furosemide (LASIX) 20 MG tablet Take 1 tablet (20 mg total) by mouth daily.  [name] gabapentin (NEURONTIN) 300 MG capsule TAKE 1 CAPSULE BY MOUTH THREE TIMES A DAY (Patient taking differently: Take 300 mg by mouth 2 (two) times daily.)  . hydrocortisone (ANUSOL-HC) 2.5 % rectal cream Place rectally 2 (two) times daily. (Patient taking differently: Place 1 application rectally 2 (two) times daily as needed for hemorrhoids.)  . levothyroxine (SYNTHROID) 100 MCG tablet TAKE 1/2 TABLET DAILY FOR THYROID. (Patient taking differently: Take 50 mcg by mouth  daily before breakfast. TAKE 1/2 TABLET DAILY FOR THYROID.)  . loperamide (IMODIUM A-D) 2 MG tablet Take up to 12 tablets /day as needed for  for Diarrhea (Patient taking differently: Take 2 mg by mouth at bedtime.)  . loratadine (CLARITIN) 10 MG tablet Take 10 mg by mouth at bedtime.  [name] losartan (COZAAR) 50 MG tablet Take 0.5 tablets (25 mg total) by mouth 2 (two)  times daily.  . metoprolol succinate (TOPROL-XL) 50 MG 24 hr tablet Take 1 tablet (50 mg total) by mouth in the morning and at bedtime. (Patient taking differently: Take 25 mg by mouth in the morning and at bedtime.)  . nitroGLYCERIN (NITROSTAT) 0.4 MG SL tablet Dissolve 1 tablet under tongue every 5 minutes if needed for Angina (Patient taking differently: Place 0.4 mg under the tongue every 5 (five) minutes as needed for chest pain. Dissolve 1 tablet under tongue every 5 minutes if needed for Angina)  . ondansetron (ZOFRAN-ODT) 8 MG disintegrating tablet Dissolve 1 tablet under tongue every 6 to 8 hours  3 x /day if needed for Nausea or Vomitting (Patient taking differently: Take 8 mg by mouth every 6 (six) hours as needed for vomiting or nausea. Dissolve 1 tablet under tongue every 6 to 8 hours  3 x /day if needed for Nausea or Vomitting)  . pantoprazole (PROTONIX) 40 MG tablet Take 1 tablet (40 mg total) by mouth 2 (two) times daily. Further refills by PCP  . potassium chloride (KLOR-CON) 10 MEQ tablet Take 10 mEq by mouth daily.  . pravastatin (PRAVACHOL) 20 MG tablet Take 1 tablet (20 mg total) by mouth daily.  [name] Propylene Glycol (SYSTANE COMPLETE) 0.6 % SOLN Place 1 drop into both eyes 3 (three) times daily as needed (irritation/dry eyes.).  [name] simethicone (MYLICON) 80 MG chewable tablet Chew 80 mg by mouth every 6 (six) hours as needed for flatulence.  . sucralfate (CARAFATE) 1 g tablet Take 1 g by mouth in the morning, at noon, and at bedtime.  [name] tobramycin-dexamethasone (TOBRADEX) ophthalmic solution Place 1 drop into both eyes daily as needed (stye).   [name] triamcinolone cream (KENALOG) 0.1 % Apply 1 application topically 2 (two) times daily as needed (rash/skin irritation.).   [name] verapamil (CALAN-SR) 120 MG CR tablet TAKE 1 TABLET 2 X /DAY WITH A MEAL FOR BP & HEART RHYTHM (Patient taking differently: Take 120 mg by mouth 2 (two) times daily. Take 1 tablet 2 x /day with a meal for BP & Heart  Rhythm)   Current Facility-Administered Medications for the 12/10/20 encounter (Office Visit) with Lorretta Harp, MD  Medication  . sodium chloride flush (NS) 0.9 % injection 3 mL     Allergies  Allergen Reactions  . Amiodarone Other (See Comments)     Thyroid and liver and kidney problems  . Adhesive [Tape] Other (See Comments)    Tears skin, Please use "paper" tape  . Hydrocodone Other (See Comments)    HALLLUCINATIONS  . Other Other (See Comments)    NO BLOOD PRODUCTS -  Jehoval Witness  . Reglan [Metoclopramide] Hives, Itching and Rash  . Remeron [Mirtazapine] Other (See Comments)    Hallucinations and make pt loopy  . Codeine Nausea And Vomiting  . Morphine And Related Nausea And Vomiting  . Requip [Ropinirole Hcl] Other (See Comments)    Headache   . Zinc Nausea Only    Social History   Socioeconomic History  . Marital status: Widowed    Spouse name: Not  on file  . Number of children: 3  . Years of education: Not on file  . Highest education level: Not on file  Occupational History  . Not on file  Tobacco Use  . Smoking status: Never Smoker  . Smokeless tobacco: Never Used  Vaping Use  . Vaping Use: Never used  Substance and Sexual Activity  . Alcohol use: No  . Drug use: No  . Sexual activity: Not Currently    Comment: Hysterectomy  Other Topics Concern  . Not on file  Social History Narrative   01/01/19 Lives in [place] w/dgtr, [name].  Continues to work.   Social Determinants of Health   Financial Resource Strain: Not on file  Food Insecurity: Not on file  Transportation Needs: Not on file  Physical Activity: Not on file  Stress: Not on file  Social Connections: Not on file  Intimate Partner Violence: Not on file     Review of Systems: General: negative for chills, fever, night sweats or weight changes.  Cardiovascular: negative for chest pain, dyspnea on exertion, edema, orthopnea, palpitations, paroxysmal nocturnal dyspnea or  shortness of breath Dermatological: negative for rash Respiratory: negative for cough or wheezing Urologic: negative for hematuria Abdominal: negative for nausea, vomiting, diarrhea, bright red blood per rectum, melena, or hematemesis Neurologic: negative for visual changes, syncope, or dizziness All other systems reviewed and are otherwise negative except as noted above.    Blood pressure 132/72, pulse 86, height _0  (1.499 m), weight 96 lb 6.4 oz (43.7 kg), SpO2 96 %.  General appearance: alert and no distress Neck: no adenopathy, no carotid bruit, no JVD, supple, symmetrical, trachea midline and thyroid not enlarged, symmetric, no tenderness/mass/nodules Lungs: clear to auscultation bilaterally Heart: regular rate and rhythm, S1, S2 normal, no murmur, click, rub or gallop Extremities: extremities normal, atraumatic, no cyanosis or edema Pulses: 2+ and symmetric Skin: Ecchymosis to medial aspect of both thighs. Neurologic: Alert and oriented X 3, normal strength and tone. Normal symmetric reflexes. Normal coordination and gait  EKG not performed today  ASSESSMENT AND PLAN:   PAD (peripheral artery disease) (HCC) History of peripheral arterial disease status post orbital atherectomy, PTA and covered stenting with a 6 mm x 25 mm long Viabahn covered stent on 11/27/2020.  She had a 99% subtotally occluded calcified distal right external iliac artery stenosis.  She was on warfarin prior to that for A. fib.  She is now on Plavix.  She has diffuse ecchymosis down the medial aspects of both thighs.  At this point, I prefer her to be on Plavix for at least 3 months with her new stent.  They are hesitant to be on both.  I will discuss anticoagulation strategy with Dr. [name], her primary cardiologist.      Lorretta Harp MD [facility] 12/10/2020 12:18 PM

## 2020-12-16 ENCOUNTER — Inpatient Hospital Stay (HOSPITAL_COMMUNITY)
Admission: EM | Admit: 2020-12-16 | Discharge: 2020-12-19 | DRG: 291 | Disposition: A | Discharge status: Home Health | Payer: Medicare Other | Attending: Internal Medicine | Admitting: Internal Medicine

## 2020-12-16 ENCOUNTER — Inpatient Hospital Stay (HOSPITAL_COMMUNITY): Payer: Medicare Other

## 2020-12-16 ENCOUNTER — Emergency Department (HOSPITAL_COMMUNITY): Payer: Medicare Other

## 2020-12-16 ENCOUNTER — Telehealth: Payer: Self-pay | Admitting: Cardiology

## 2020-12-16 ENCOUNTER — Other Ambulatory Visit: Payer: Self-pay

## 2020-12-16 DIAGNOSIS — I25118 Atherosclerotic heart disease of native coronary artery with other forms of angina pectoris: Secondary | ICD-10-CM

## 2020-12-16 DIAGNOSIS — C801 Malignant (primary) neoplasm, unspecified: Secondary | ICD-10-CM | POA: Diagnosis not present

## 2020-12-16 DIAGNOSIS — I130 Hypertensive heart and chronic kidney disease with heart failure and stage 1 through stage 4 chronic kidney disease, or unspecified chronic kidney disease: Principal | ICD-10-CM | POA: Diagnosis present

## 2020-12-16 DIAGNOSIS — Z8551 Personal history of malignant neoplasm of bladder: Secondary | ICD-10-CM | POA: Diagnosis not present

## 2020-12-16 DIAGNOSIS — Z66 Do not resuscitate: Secondary | ICD-10-CM | POA: Diagnosis not present

## 2020-12-16 DIAGNOSIS — I2510 Atherosclerotic heart disease of native coronary artery without angina pectoris: Secondary | ICD-10-CM | POA: Diagnosis not present

## 2020-12-16 DIAGNOSIS — Z96641 Presence of right artificial hip joint: Secondary | ICD-10-CM | POA: Diagnosis present

## 2020-12-16 DIAGNOSIS — G629 Polyneuropathy, unspecified: Secondary | ICD-10-CM | POA: Diagnosis present

## 2020-12-16 DIAGNOSIS — N1832 Chronic kidney disease, stage 3b: Secondary | ICD-10-CM | POA: Diagnosis not present

## 2020-12-16 DIAGNOSIS — Z8542 Personal history of malignant neoplasm of other parts of uterus: Secondary | ICD-10-CM

## 2020-12-16 DIAGNOSIS — E43 Unspecified severe protein-calorie malnutrition: Secondary | ICD-10-CM | POA: Diagnosis present

## 2020-12-16 DIAGNOSIS — Z888 Allergy status to other drugs, medicaments and biological substances status: Secondary | ICD-10-CM

## 2020-12-16 DIAGNOSIS — J90 Pleural effusion, not elsewhere classified: Secondary | ICD-10-CM | POA: Diagnosis not present

## 2020-12-16 DIAGNOSIS — Z9841 Cataract extraction status, right eye: Secondary | ICD-10-CM

## 2020-12-16 DIAGNOSIS — Z681 Body mass index (BMI) 19 or less, adult: Secondary | ICD-10-CM | POA: Diagnosis not present

## 2020-12-16 DIAGNOSIS — J40 Bronchitis, not specified as acute or chronic: Secondary | ICD-10-CM | POA: Diagnosis not present

## 2020-12-16 DIAGNOSIS — Z48813 Encounter for surgical aftercare following surgery on the respiratory system: Secondary | ICD-10-CM | POA: Diagnosis not present

## 2020-12-16 DIAGNOSIS — G2581 Restless legs syndrome: Secondary | ICD-10-CM | POA: Diagnosis present

## 2020-12-16 DIAGNOSIS — Z885 Allergy status to narcotic agent status: Secondary | ICD-10-CM | POA: Diagnosis not present

## 2020-12-16 DIAGNOSIS — Z85528 Personal history of other malignant neoplasm of kidney: Secondary | ICD-10-CM

## 2020-12-16 DIAGNOSIS — Z531 Procedure and treatment not carried out because of patient's decision for reasons of belief and group pressure: Secondary | ICD-10-CM | POA: Diagnosis present

## 2020-12-16 DIAGNOSIS — Z96651 Presence of right artificial knee joint: Secondary | ICD-10-CM | POA: Diagnosis present

## 2020-12-16 DIAGNOSIS — Z905 Acquired absence of kidney: Secondary | ICD-10-CM

## 2020-12-16 DIAGNOSIS — Z952 Presence of prosthetic heart valve: Secondary | ICD-10-CM | POA: Diagnosis not present

## 2020-12-16 DIAGNOSIS — J918 Pleural effusion in other conditions classified elsewhere: Secondary | ICD-10-CM | POA: Diagnosis not present

## 2020-12-16 DIAGNOSIS — J9811 Atelectasis: Secondary | ICD-10-CM | POA: Diagnosis not present

## 2020-12-16 DIAGNOSIS — F419 Anxiety disorder, unspecified: Secondary | ICD-10-CM | POA: Diagnosis present

## 2020-12-16 DIAGNOSIS — E860 Dehydration: Secondary | ICD-10-CM | POA: Diagnosis present

## 2020-12-16 DIAGNOSIS — N183 Chronic kidney disease, stage 3 unspecified: Secondary | ICD-10-CM | POA: Diagnosis present

## 2020-12-16 DIAGNOSIS — I480 Paroxysmal atrial fibrillation: Secondary | ICD-10-CM

## 2020-12-16 DIAGNOSIS — Z903 Acquired absence of stomach [part of]: Secondary | ICD-10-CM

## 2020-12-16 DIAGNOSIS — E039 Hypothyroidism, unspecified: Secondary | ICD-10-CM | POA: Diagnosis present

## 2020-12-16 DIAGNOSIS — Z8719 Personal history of other diseases of the digestive system: Secondary | ICD-10-CM | POA: Diagnosis not present

## 2020-12-16 DIAGNOSIS — D649 Anemia, unspecified: Secondary | ICD-10-CM | POA: Diagnosis present

## 2020-12-16 DIAGNOSIS — Z7902 Long term (current) use of antithrombotics/antiplatelets: Secondary | ICD-10-CM | POA: Diagnosis not present

## 2020-12-16 DIAGNOSIS — I4819 Other persistent atrial fibrillation: Secondary | ICD-10-CM | POA: Diagnosis not present

## 2020-12-16 DIAGNOSIS — I739 Peripheral vascular disease, unspecified: Secondary | ICD-10-CM | POA: Diagnosis present

## 2020-12-16 DIAGNOSIS — Z20822 Contact with and (suspected) exposure to covid-19: Secondary | ICD-10-CM | POA: Diagnosis present

## 2020-12-16 DIAGNOSIS — I495 Sick sinus syndrome: Secondary | ICD-10-CM | POA: Diagnosis present

## 2020-12-16 DIAGNOSIS — I5033 Acute on chronic diastolic (congestive) heart failure: Secondary | ICD-10-CM | POA: Diagnosis present

## 2020-12-16 DIAGNOSIS — Z90710 Acquired absence of both cervix and uterus: Secondary | ICD-10-CM

## 2020-12-16 DIAGNOSIS — Z953 Presence of xenogenic heart valve: Secondary | ICD-10-CM

## 2020-12-16 DIAGNOSIS — H548 Legal blindness, as defined in USA: Secondary | ICD-10-CM | POA: Diagnosis present

## 2020-12-16 DIAGNOSIS — I10 Essential (primary) hypertension: Secondary | ICD-10-CM | POA: Diagnosis not present

## 2020-12-16 DIAGNOSIS — H9190 Unspecified hearing loss, unspecified ear: Secondary | ICD-10-CM | POA: Diagnosis present

## 2020-12-16 DIAGNOSIS — I5031 Acute diastolic (congestive) heart failure: Secondary | ICD-10-CM | POA: Diagnosis not present

## 2020-12-16 DIAGNOSIS — N179 Acute kidney failure, unspecified: Secondary | ICD-10-CM | POA: Diagnosis not present

## 2020-12-16 DIAGNOSIS — Z8673 Personal history of transient ischemic attack (TIA), and cerebral infarction without residual deficits: Secondary | ICD-10-CM

## 2020-12-16 DIAGNOSIS — Z9049 Acquired absence of other specified parts of digestive tract: Secondary | ICD-10-CM

## 2020-12-16 DIAGNOSIS — K219 Gastro-esophageal reflux disease without esophagitis: Secondary | ICD-10-CM | POA: Diagnosis present

## 2020-12-16 DIAGNOSIS — Z950 Presence of cardiac pacemaker: Secondary | ICD-10-CM

## 2020-12-16 DIAGNOSIS — R6889 Other general symptoms and signs: Secondary | ICD-10-CM | POA: Diagnosis not present

## 2020-12-16 DIAGNOSIS — Z9842 Cataract extraction status, left eye: Secondary | ICD-10-CM

## 2020-12-16 DIAGNOSIS — I5032 Chronic diastolic (congestive) heart failure: Secondary | ICD-10-CM

## 2020-12-16 DIAGNOSIS — R0902 Hypoxemia: Secondary | ICD-10-CM | POA: Diagnosis not present

## 2020-12-16 DIAGNOSIS — Z9889 Other specified postprocedural states: Secondary | ICD-10-CM

## 2020-12-16 DIAGNOSIS — R0602 Shortness of breath: Secondary | ICD-10-CM | POA: Diagnosis not present

## 2020-12-16 DIAGNOSIS — Z800 Family history of malignant neoplasm of digestive organs: Secondary | ICD-10-CM

## 2020-12-16 DIAGNOSIS — I4891 Unspecified atrial fibrillation: Secondary | ICD-10-CM | POA: Diagnosis present

## 2020-12-16 DIAGNOSIS — E876 Hypokalemia: Secondary | ICD-10-CM | POA: Diagnosis not present

## 2020-12-16 DIAGNOSIS — N1831 Chronic kidney disease, stage 3a: Secondary | ICD-10-CM | POA: Diagnosis not present

## 2020-12-16 DIAGNOSIS — Z85028 Personal history of other malignant neoplasm of stomach: Secondary | ICD-10-CM | POA: Diagnosis not present

## 2020-12-16 DIAGNOSIS — Z8249 Family history of ischemic heart disease and other diseases of the circulatory system: Secondary | ICD-10-CM

## 2020-12-16 DIAGNOSIS — E785 Hyperlipidemia, unspecified: Secondary | ICD-10-CM | POA: Diagnosis present

## 2020-12-16 DIAGNOSIS — Z974 Presence of external hearing-aid: Secondary | ICD-10-CM

## 2020-12-16 DIAGNOSIS — I509 Heart failure, unspecified: Secondary | ICD-10-CM

## 2020-12-16 DIAGNOSIS — Z743 Need for continuous supervision: Secondary | ICD-10-CM | POA: Diagnosis not present

## 2020-12-16 DIAGNOSIS — Z8553 Personal history of malignant neoplasm of renal pelvis: Secondary | ICD-10-CM

## 2020-12-16 LAB — CBC WITH DIFFERENTIAL/PLATELET
Abs Immature Granulocytes: 0.02 10*3/uL (ref 0.00–0.07)
Basophils Absolute: 0 10*3/uL (ref 0.0–0.1)
Basophils Relative: 1 %
Eosinophils Absolute: 0.2 10*3/uL (ref 0.0–0.5)
Eosinophils Relative: 3 %
HCT: 35.5 % — ABNORMAL LOW (ref 36.0–46.0)
Hemoglobin: 11.2 g/dL — ABNORMAL LOW (ref 12.0–15.0)
Immature Granulocytes: 0 %
Lymphocytes Relative: 21 %
Lymphs Abs: 1.3 10*3/uL (ref 0.7–4.0)
MCH: 32.4 pg (ref 26.0–34.0)
MCHC: 31.5 g/dL (ref 30.0–36.0)
MCV: 102.6 fL — ABNORMAL HIGH (ref 80.0–100.0)
Monocytes Absolute: 0.6 10*3/uL (ref 0.1–1.0)
Monocytes Relative: 9 %
Neutro Abs: 4.3 10*3/uL (ref 1.7–7.7)
Neutrophils Relative %: 66 %
Platelets: 250 10*3/uL (ref 150–400)
RBC: 3.46 MIL/uL — ABNORMAL LOW (ref 3.87–5.11)
RDW: 16 % — ABNORMAL HIGH (ref 11.5–15.5)
WBC: 6.4 10*3/uL (ref 4.0–10.5)
nRBC: 0 % (ref 0.0–0.2)

## 2020-12-16 LAB — BRAIN NATRIURETIC PEPTIDE: B Natriuretic Peptide: 814.1 pg/mL — ABNORMAL HIGH (ref 0.0–100.0)

## 2020-12-16 LAB — COMPREHENSIVE METABOLIC PANEL
ALT: 9 U/L (ref 0–44)
AST: 21 U/L (ref 15–41)
Albumin: 2.8 g/dL — ABNORMAL LOW (ref 3.5–5.0)
Alkaline Phosphatase: 86 U/L (ref 38–126)
Anion gap: 6 (ref 5–15)
BUN: 14 mg/dL (ref 8–23)
CO2: 25 mmol/L (ref 22–32)
Calcium: 8.6 mg/dL — ABNORMAL LOW (ref 8.9–10.3)
Chloride: 109 mmol/L (ref 98–111)
Creatinine, Ser: 1.01 mg/dL — ABNORMAL HIGH (ref 0.44–1.00)
GFR, Estimated: 55 mL/min — ABNORMAL LOW (ref >60)
Glucose, Bld: 90 mg/dL (ref 70–99)
Potassium: 3.9 mmol/L (ref 3.5–5.1)
Sodium: 140 mmol/L (ref 135–145)
Total Bilirubin: 1.1 mg/dL (ref 0.3–1.2)
Total Protein: 6.5 g/dL (ref 6.5–8.1)

## 2020-12-16 LAB — RESP PANEL BY RT-PCR (FLU A&B, COVID) ARPGX2
Influenza A by PCR: NEGATIVE
Influenza B by PCR: NEGATIVE
SARS Coronavirus 2 by RT PCR: NEGATIVE

## 2020-12-16 LAB — TROPONIN I (HIGH SENSITIVITY)
Troponin I (High Sensitivity): 12 ng/L (ref <18)
Troponin I (High Sensitivity): 11 ng/L (ref <18)

## 2020-12-16 LAB — PROTIME-INR
INR: 1.1 (ref 0.8–1.2)
Prothrombin Time: 13.8 seconds (ref 11.4–15.2)

## 2020-12-16 MED ORDER — FERROUS SULFATE 325 (65 FE) MG PO TABS
325.0000 mg | ORAL_TABLET | Freq: Every day | ORAL | Status: DC
  Administered 2020-12-16 – 2020-12-18 (×3): 325 mg via ORAL
  Filled 2020-12-16 (×3): qty 1

## 2020-12-16 MED ORDER — VERAPAMIL HCL ER 120 MG PO TBCR
120.0000 mg | EXTENDED_RELEASE_TABLET | Freq: Two times a day (BID) | ORAL | Status: DC
  Administered 2020-12-16 – 2020-12-19 (×6): 120 mg via ORAL
  Filled 2020-12-16 (×8): qty 1

## 2020-12-16 MED ORDER — ENOXAPARIN SODIUM 30 MG/0.3ML ~~LOC~~ SOLN
30.0000 mg | SUBCUTANEOUS | Status: DC
  Administered 2020-12-16 – 2020-12-18 (×3): 30 mg via SUBCUTANEOUS
  Filled 2020-12-16 (×3): qty 0.3

## 2020-12-16 MED ORDER — SODIUM CHLORIDE 0.9 % IV SOLN: 250.0000 mL | INTRAVENOUS | Status: DC | PRN

## 2020-12-16 MED ORDER — CYANOCOBALAMIN 5000 MCG SL SUBL: 5000.0000 ug | SUBLINGUAL_TABLET | Freq: Every day | SUBLINGUAL | Status: DC

## 2020-12-16 MED ORDER — VITAMIN D 25 MCG (1000 UNIT) PO TABS
5000.0000 [IU] | ORAL_TABLET | Freq: Three times a day (TID) | ORAL | Status: DC
  Administered 2020-12-16 – 2020-12-17 (×3): 5000 [IU] via ORAL
  Filled 2020-12-16 (×5): qty 5

## 2020-12-16 MED ORDER — ONDANSETRON HCL 4 MG/2ML IJ SOLN
4.0000 mg | Freq: Four times a day (QID) | INTRAMUSCULAR | Status: DC | PRN
  Administered 2020-12-17 – 2020-12-18 (×2): 4 mg via INTRAVENOUS
  Filled 2020-12-16 (×3): qty 2

## 2020-12-16 MED ORDER — ACETAMINOPHEN 325 MG PO TABS: 650.0000 mg | ORAL_TABLET | ORAL | Status: DC | PRN

## 2020-12-16 MED ORDER — TOBRAMYCIN-DEXAMETHASONE 0.3-0.1 % OP SUSP
1.0000 [drp] | Freq: Every day | OPHTHALMIC | Status: DC | PRN
  Filled 2020-12-16: qty 2.5

## 2020-12-16 MED ORDER — FUROSEMIDE 10 MG/ML IJ SOLN
20.0000 mg | Freq: Two times a day (BID) | INTRAMUSCULAR | Status: DC
  Administered 2020-12-16 – 2020-12-18 (×4): 20 mg via INTRAVENOUS
  Filled 2020-12-16 (×4): qty 2

## 2020-12-16 MED ORDER — GABAPENTIN 300 MG PO CAPS
300.0000 mg | ORAL_CAPSULE | Freq: Two times a day (BID) | ORAL | Status: DC
  Administered 2020-12-16 – 2020-12-19 (×6): 300 mg via ORAL
  Filled 2020-12-16 (×6): qty 1

## 2020-12-16 MED ORDER — LOPERAMIDE HCL 2 MG PO CAPS
2.0000 mg | ORAL_CAPSULE | Freq: Every day | ORAL | Status: DC
  Administered 2020-12-16: 2 mg via ORAL
  Filled 2020-12-16: qty 1

## 2020-12-16 MED ORDER — TRIAMCINOLONE ACETONIDE 0.1 % EX CREA
1.0000 "application " | TOPICAL_CREAM | Freq: Two times a day (BID) | CUTANEOUS | Status: DC | PRN
  Filled 2020-12-16: qty 15

## 2020-12-16 MED ORDER — HYDROCORTISONE (PERIANAL) 2.5 % EX CREA
1.0000 "application " | TOPICAL_CREAM | Freq: Two times a day (BID) | CUTANEOUS | Status: DC | PRN
  Filled 2020-12-16: qty 28.35

## 2020-12-16 MED ORDER — ASCORBIC ACID 500 MG PO TABS
1000.0000 mg | ORAL_TABLET | Freq: Every day | ORAL | Status: DC
  Administered 2020-12-17: 1000 mg via ORAL
  Filled 2020-12-16 (×2): qty 2

## 2020-12-16 MED ORDER — CLOPIDOGREL BISULFATE 75 MG PO TABS
75.0000 mg | ORAL_TABLET | Freq: Every day | ORAL | Status: DC
  Administered 2020-12-17 – 2020-12-19 (×3): 75 mg via ORAL
  Filled 2020-12-16 (×3): qty 1

## 2020-12-16 MED ORDER — LEVOTHYROXINE SODIUM 25 MCG PO TABS
50.0000 ug | ORAL_TABLET | Freq: Every day | ORAL | Status: DC
  Administered 2020-12-17 – 2020-12-19 (×3): 50 ug via ORAL
  Filled 2020-12-16 (×3): qty 2

## 2020-12-16 MED ORDER — VITAMIN B-12 1000 MCG PO TABS
5000.0000 ug | ORAL_TABLET | Freq: Every day | ORAL | Status: DC
  Administered 2020-12-17: 5000 ug via ORAL
  Filled 2020-12-16 (×2): qty 5

## 2020-12-16 MED ORDER — SIMETHICONE 80 MG PO CHEW
80.0000 mg | CHEWABLE_TABLET | Freq: Four times a day (QID) | ORAL | Status: DC | PRN
  Administered 2020-12-18: 80 mg via ORAL
  Filled 2020-12-16: qty 1

## 2020-12-16 MED ORDER — SODIUM CHLORIDE 0.9% FLUSH
3.0000 mL | Freq: Two times a day (BID) | INTRAVENOUS | Status: DC
  Administered 2020-12-19: 3 mL via INTRAVENOUS

## 2020-12-16 MED ORDER — METOPROLOL SUCCINATE ER 25 MG PO TB24
50.0000 mg | ORAL_TABLET | Freq: Every day | ORAL | Status: DC
  Administered 2020-12-17 – 2020-12-19 (×3): 50 mg via ORAL
  Filled 2020-12-16 (×3): qty 2

## 2020-12-16 MED ORDER — ERYTHROMYCIN 5 MG/GM OP OINT
1.0000 "application " | TOPICAL_OINTMENT | Freq: Every evening | OPHTHALMIC | Status: DC | PRN
  Filled 2020-12-16: qty 3.5

## 2020-12-16 MED ORDER — FUROSEMIDE 10 MG/ML IJ SOLN
40.0000 mg | Freq: Once | INTRAMUSCULAR | Status: AC
  Administered 2020-12-16: 40 mg via INTRAVENOUS
  Filled 2020-12-16: qty 4

## 2020-12-16 MED ORDER — ACETAMINOPHEN 500 MG PO TABS: 500.0000 mg | ORAL_TABLET | Freq: Three times a day (TID) | ORAL | Status: DC | PRN

## 2020-12-16 MED ORDER — PRAVASTATIN SODIUM 10 MG PO TABS
20.0000 mg | ORAL_TABLET | Freq: Every day | ORAL | Status: DC
  Administered 2020-12-16 – 2020-12-19 (×4): 20 mg via ORAL
  Filled 2020-12-16 (×4): qty 2

## 2020-12-16 MED ORDER — SODIUM CHLORIDE 0.9% FLUSH: 3.0000 mL | INTRAVENOUS | Status: DC | PRN

## 2020-12-16 MED ORDER — LOSARTAN POTASSIUM 50 MG PO TABS
25.0000 mg | ORAL_TABLET | Freq: Two times a day (BID) | ORAL | Status: DC
  Administered 2020-12-16 – 2020-12-19 (×6): 25 mg via ORAL
  Filled 2020-12-16 (×6): qty 1

## 2020-12-16 MED ORDER — ALUM & MAG HYDROXIDE-SIMETH 200-200-20 MG/5ML PO SUSP
15.0000 mL | Freq: Every evening | ORAL | Status: DC
  Administered 2020-12-16 – 2020-12-18 (×3): 15 mL via ORAL
  Filled 2020-12-16 (×3): qty 30

## 2020-12-16 MED ORDER — ALPRAZOLAM 0.25 MG PO TABS
0.1250 mg | ORAL_TABLET | Freq: Two times a day (BID) | ORAL | Status: DC | PRN
  Filled 2020-12-16: qty 1

## 2020-12-16 MED ORDER — SODIUM CHLORIDE 0.9% FLUSH
3.0000 mL | Freq: Two times a day (BID) | INTRAVENOUS | Status: DC
  Administered 2020-12-16 – 2020-12-19 (×6): 3 mL via INTRAVENOUS

## 2020-12-16 MED ORDER — ENSURE ENLIVE PO LIQD
237.0000 mL | Freq: Two times a day (BID) | ORAL | Status: DC
  Filled 2020-12-16: qty 237

## 2020-12-16 MED ORDER — POTASSIUM CHLORIDE CRYS ER 10 MEQ PO TBCR
10.0000 meq | EXTENDED_RELEASE_TABLET | Freq: Every day | ORAL | Status: DC
  Administered 2020-12-17: 10 meq via ORAL
  Filled 2020-12-16: qty 1

## 2020-12-16 MED ORDER — PANTOPRAZOLE SODIUM 40 MG PO TBEC
40.0000 mg | DELAYED_RELEASE_TABLET | Freq: Two times a day (BID) | ORAL | Status: DC
  Administered 2020-12-16 – 2020-12-19 (×6): 40 mg via ORAL
  Filled 2020-12-16 (×6): qty 1

## 2020-12-16 MED ORDER — POLYVINYL ALCOHOL 1.4 % OP SOLN
1.0000 [drp] | Freq: Three times a day (TID) | OPHTHALMIC | Status: DC | PRN
  Filled 2020-12-16: qty 15

## 2020-12-16 MED ORDER — LORATADINE 10 MG PO TABS
10.0000 mg | ORAL_TABLET | Freq: Every day | ORAL | Status: DC
  Administered 2020-12-16 – 2020-12-18 (×3): 10 mg via ORAL
  Filled 2020-12-16 (×3): qty 1

## 2020-12-16 MED ORDER — SUCRALFATE 1 G PO TABS
1.0000 g | ORAL_TABLET | Freq: Four times a day (QID) | ORAL | Status: DC
  Administered 2020-12-16 – 2020-12-19 (×10): 1 g via ORAL
  Filled 2020-12-16 (×10): qty 1

## 2020-12-16 NOTE — Consult Note (Addendum)
Cardiology Consultation:   Patient ID: [name] MRN: [id number]; DOB: 1933/12/19  Admit date: 12/16/2020 Date of Consult: 12/16/2020  PCP:  [name], [place]  Cardiologist:  [name], MD 11/17/2020 Advanced Practice Provider:  No care team member to display Electrophysiologist:  Thompson Grayer, MD  PAD: Dr Gwenlyn Found 12/10/2020  Patient Profile:   [name] is a 87 y.o. female with a PAD hx of orbital atherectomy followed by PTA and covered stenting of a subtotally occluded highly calcified distal right common femoral artery on 29/10/1113, chronic diastolic CHF, paroxysmal atrial fibrillation,aortic stenosis s/p TAVR,tachy-brady syndrome s/p PPM, HTN, HLD, left central retinal artery occlusion from cholesterol plaque,stroke, bladder cancer,stomach cancer, solitary kidney with CKD stage 3a, and anemia (Jehovah's witness) who is being seen today for the evaluation of CHF at the request of Dr Roosevelt Locks.  History of Present Illness:   [name] has been feeling weak and not herself since her recent 11/27/20 PAD stenting.  Had some anemia after the procedure, with Hbg 13.2  >> 8.9 >> 6 and heme + stools. Coumadin was held, d/c'd on Plavix alone. She was seen in the office on 04/06. She had diffuse ecchymosis down both thighs. She was seen by Dr. Carlean Purl who felt the anemia was coming from the hematomas, and no GI workup was indicated at this time (thought she has upcoming EGD 5/22)  Patient notes that she has been very weak and daughter had attributed to blood loss and deconditioning. She does not believe she has had atrial fib (has not had that level of weakness and SOB she associated with symptomatic AF).  She does note SOB with activity. Was able to walk up some steps yesterday, did not get as SOB as she has in the past when her CHF was bad or was in Afib. Her weight has not significantly changed. Her HR has been stable.  Through all of this  she had some chest tightness this AM. Her daughter gave her multiple SL NTG w/out relief. That is what made her come in. In the ER, she has been given Lasix 40 mg IV. Hgb stable.  CXR showed a new pleural effusion. Cardiology called to assist in care.  Past Medical History:  Diagnosis Date  . Adenocarcinoma of stomach ([place]) 11/11/2015   gastric mass on egd,   . Anal fissure   . Anemia    hx 3/17 iron infusion, on aranesp and injected B12 since 11/2015.   [name] Anxiety   . Aortic root dilatation ([place])   . Arthritis    oa  . Bladder cancer ([place])   . Blind left eye 2015   partial blind in left eye due to stroke   . 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 CAD (coronary artery disease)   . Cataracts, bilateral   . Chronic kidney disease    only has one kidney rt lft rem 03 ca     dr. Risa Grill, bladder cancer 05-04-2018  . Elevated LFTs    hepatic steatosis, none recent  . Endometrial cancer ([place])   . Gastroparesis   . GERD (gastroesophageal reflux disease)   . Hearing loss    wears hearing aids  . Heart murmur   . History of uterine cancer 1980s   treated with hysterectomy, external radiation and radiation seed implants.   [name] HTN (hypertension)   . Hyperlipemia   . Hypothyroidism   . Iron deficiency anemia due to chronic blood loss  11/24/2015  . Neuropathy    right leg  . Osteomyelitis ([place]) as child  . PAF (paroxysmal atrial fibrillation) ([place])   . Presence of permanent cardiac pacemaker   . Refusal of blood transfusions as patient is Jehovah's Witness   . Restless leg syndrome   . 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 Severe aortic stenosis   . Stroke [facility]) 2015   partial stroke left legally blind; left eye   . Tachycardia-bradycardia syndrome ([place])    s/p PPM by Dr Doreatha Lew (MDT) 07/03/10  . 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 Walker as ambulation aid   .  Wears glasses   . Wears partial dentures    upper    Past Surgical History:  Procedure Laterality Date  . ABDOMINAL AORTOGRAM W/LOWER EXTREMITY Bilateral 11/27/2020   Procedure: ABDOMINAL AORTOGRAM W/LOWER EXTREMITY;  Surgeon: Lorretta Harp, MD;  Location: [place] CV LAB;  Service: Cardiovascular;  Laterality: Bilateral;  limited to iliacs  . CARDIAC CATHETERIZATION  2008  . CARDIOVERSION N/A 12/05/2019   Procedure: CARDIOVERSION;  Surgeon: Elouise Munroe, MD;  Location[facility] ENDOSCOPY;  Service: Cardiovascular;  Laterality: N/A;  . CARDIOVERSION N/A 06/13/2020   Procedure: CARDIOVERSION;  Surgeon: Buford Dresser, MD;  Location: [facility] ENDOSCOPY;  Service: Cardiovascular;  Laterality: N/A;  . CHOLECYSTECTOMY  1970s  . COLONOSCOPY N/A 11/11/2015   Procedure: COLONOSCOPY;  Surgeon: [name], MD;  Location: [facility] ENDOSCOPY;  Service: Endoscopy;  Laterality: N/A;  . CYSTOSCOPY W/ RETROGRADES Right 05/01/2018   Procedure: CYSTOSCOPY WITH RIGHT RETROGRADE PYELOGRAM;  Surgeon: Lucas Mallow, MD;  [facility]: [facility] ORS;  Service: Urology;  Laterality: Right;  . CYSTOSCOPY W/ RETROGRADES Right 07/21/2018   Procedure: CYSTOSCOPY WITH RIGHT RETROGRADE PYELOGRAM;  Surgeon: Alexis Frock, MD;  [facility];  Service: Urology;  Laterality: Right;  . ESOPHAGOGASTRODUODENOSCOPY N/A 11/11/2015   Procedure: ESOPHAGOGASTRODUODENOSCOPY (EGD);  Surgeon: [name], MD;  Location: [facility];  Service: Endoscopy;  Laterality: N/A;  . ESOPHAGOGASTRODUODENOSCOPY N/A 02/05/2016   Procedure: ESOPHAGOGASTRODUODENOSCOPY (EGD);  Surgeon: Ladene Artist, MD;  Location: [facility] ENDOSCOPY;  Service: Endoscopy;  Laterality: N/A;  . ESOPHAGOGASTRODUODENOSCOPY N/A 04/02/2016   Procedure: ESOPHAGOGASTRODUODENOSCOPY (EGD);  Surgeon: [name], MD;  Location: [facility]OS[facility];  Service: Endoscopy;  Laterality: N/A;  . ESOPHAGOGASTRODUODENOSCOPY (EGD) WITH PROPOFOL N/A 04/06/2017   Procedure:  ESOPHAGOGASTRODUODENOSCOPY (EGD) WITH PROPOFOL;  Surgeon: [name], MD;  [facility];  Service: Endoscopy;  Laterality: N/A;  . EYE SURGERY Bilateral    remove cataracts  . GASTRECTOMY N/A 01/15/2016   Procedure: OPEN PARTIAL GASTRECTOMY;  Surgeon: Stark Klein, MD;  Location: [place];  Service: General;  Laterality: N/A;  . GASTRECTOMY Left 02/24/2016   Procedure: OPEN J TUBE;  Surgeon: Stark Klein, MD;  Location: [place];  Service: General;  Laterality: Left;  [name] GASTROJEJUNOSTOMY N/A 05/18/2016   Procedure: ROUX EN [place];  Surgeon: Stark Klein, MD;  Location: [place];  Service: General;  Laterality: N/A;  . I & D KNEE WITH POLY EXCHANGE Right 12/17/2013   Procedure: IRRIGATION AND DEBRIDEMEN RIGHT TOTAL  KNEE WITH POLY EXCHANGE;  Surgeon: Mauri Pole, MD;  [facility];  Service: Orthopedics;  Laterality: Right;  . IR GENERIC HISTORICAL  06/17/2016   IR RADIOLOGIST EVAL & MGMT 06/17/2016 Ascencion Dike, PA-C [facility] INTERV RAD  . LAPAROSCOPY  N/A 01/15/2016   Procedure: LAPAROSCOPY DIAGNOSTIC;  Surgeon: Stark Klein, MD;  Location: [place];  Service: General;  Laterality: N/A;  . LYSIS OF ADHESION  05/18/2016   Procedure: LYSIS OF ADHESION;  Surgeon: Stark Klein, MD;  Location: [place];  Service: General;;  . NEPHRECTOMY Left    renal cell cancer  . PACEMAKER INSERTION  07/04/2011   MDT PPM implanted by Dr Doreatha Lew for tachy/brady; managed by Dr Thompson Grayer   . PATELLAR TENDON REPAIR Right 03/06/2014   Procedure: AVULSION WITH PATELLA TENDON REPAIR;  Surgeon: Mauri Pole, MD;  [facility];  Service: Orthopedics;  Laterality: Right;  . PATENT DUCTUS ARTERIOUS REPAIR  ~ 1949  . PERIPHERAL VASCULAR ATHERECTOMY Right 11/27/2020   Procedure: PERIPHERAL VASCULAR ATHERECTOMY;  Surgeon: Lorretta Harp, MD;  Location: [place] CV LAB;  Service: Cardiovascular;  Laterality: Right;  ext iliac  . PERIPHERAL VASCULAR INTERVENTION Right 11/27/2020   Procedure:  PERIPHERAL VASCULAR INTERVENTION;  Surgeon: Lorretta Harp, MD;  Location: [place] CV LAB;  Service: Cardiovascular;  Laterality: Right;  ext iliac  . RIGHT/LEFT HEART CATH AND CORONARY ANGIOGRAPHY N/A 10/09/2019   Procedure: RIGHT/LEFT HEART CATH AND CORONARY ANGIOGRAPHY;  Surgeon: [name], [name], MD;  Location: [place] CV LAB;  Service: Cardiovascular;  Laterality: N/A;  . TEE WITHOUT CARDIOVERSION N/A 10/30/2019   Procedure: TRANSESOPHAGEAL ECHOCARDIOGRAM (TEE);  Surgeon: Burnell Blanks, MD;  Location: [place];  Service: Open Heart Surgery;  Laterality: N/A;  . TEE WITHOUT CARDIOVERSION N/A 06/13/2020   Procedure: TRANSESOPHAGEAL ECHOCARDIOGRAM (TEE);  Surgeon: Buford Dresser, MD;  Location: [facility] ENDOSCOPY;  Service: Cardiovascular;  Laterality: N/A;  . TOTAL ABDOMINAL HYSTERECTOMY  85  . TOTAL HIP REVISION Right 2009   initial replacment surg  . TOTAL KNEE ARTHROPLASTY  02/07/2012   Procedure: TOTAL KNEE ARTHROPLASTY;  Surgeon: Mauri Pole, MD;  [facility];  Service: Orthopedics;  Laterality: Right;  . TOTAL KNEE REVISION Right 07/29/2014   Procedure: RIGHT KNEE REVISION OF PREVIOUS REPAIR EXTENSOR MECHANISM;  Surgeon: Mauri Pole, MD;  [facility];  Service: Orthopedics;  Laterality: Right;  . TRANSCATHETER AORTIC VALVE REPLACEMENT, CAROTID Left 10/30/2019   TRANSCATHETER AORTIC VALVE REPLACEMENT, LEFT CAROTID (Left Neck)   . TRANSCATHETER AORTIC VALVE REPLACEMENT, CAROTID Left 10/30/2019   Procedure: TRANSCATHETER AORTIC VALVE REPLACEMENT, LEFT CAROTID;  Surgeon: Burnell Blanks, MD;  Location: [place];  Service: Open Heart Surgery;  Laterality: Left;  . TRANSURETHRAL RESECTION OF BLADDER TUMOR N/A 07/21/2018   Procedure: TRANSURETHRAL RESECTION OF BLADDER TUMOR (TURBT);  Surgeon: Alexis Frock, MD;  [facility];  Service: Urology;  Laterality: N/A;  . TRANSURETHRAL RESECTION OF BLADDER TUMOR WITH MITOMYCIN-C N/A 05/01/2018   Procedure:  TRANSURETHRAL RESECTION OF BLADDER TUMOR WITH POST OP INSTILLATION OF GEMCITABINE;  Surgeon: Lucas Mallow, MD;  [facility];  Service: Urology;  Laterality: N/A;  . WHIPPLE PROCEDURE N/A 05/18/2016   Procedure: EXPLORATORY LAPAROTOMY;  Surgeon: Stark Klein, MD;  Location: [place];  Service: General;  Laterality: N/A;     Home Medications:  Prior to Admission medications   Medication Sig Start Date End Date Taking? Authorizing Provider  acetaminophen (TYLENOL) 500 MG tablet Take 500 mg by mouth every 8 (eight) hours as needed for moderate pain.    [provider]  ALPRAZolam Duanne Moron) 0.25 MG tablet Take 0.5-1 tablets (0.125-0.25 mg total) by mouth 2 (two) times daily as needed for anxiety. 08/18/20   [name], NP  Alum & Mag Hydroxide-Simeth (MYLANTA PO) Take 20 mLs by mouth every evening.     [provider]  amoxicillin (AMOXIL) 500 MG tablet Take 4 tablets (2,000 mg total) by mouth as directed. 1 hour prior to dental work including cleanings 11/26/19   Eileen [place], PA-C  Ascorbic Acid (VITAMIN C) 1000 MG tablet Take 1,000 mg by mouth daily.    [provider]  Cholecalciferol (VITAMIN D-3) 5000 units TABS Take 5,000 Units by mouth 3 (three) times daily.     [provider]  clopidogrel (PLAVIX) 75 MG tablet Take 1 tablet (75 mg total) by mouth daily with breakfast. 11/29/20   Bhagat, Crista Luria, PA  clopidogrel (PLAVIX) 75 MG tablet TAKE 1 TABLET (75 MG TOTAL) BY MOUTH DAILY WITH BREAKFAST. 11/28/20 11/28/21  Bhagat, Bhavinkumar, PA  Cyanocobalamin 5000 MCG SUBL Place 5,000 mcg under the tongue daily.     [provider]  erythromycin ophthalmic ointment Place 1 application into both eyes at bedtime as needed (stye).     [provider]  ferrous sulfate 325 (65 FE) MG tablet Take 325 mg by mouth daily in the afternoon.    [provider]  furosemide (LASIX) 20 MG tablet Take 1 tablet (20 mg total) by mouth daily.  04/07/20 04/02/21  [name], MD  gabapentin (NEURONTIN) 300 MG capsule TAKE 1 CAPSULE BY MOUTH THREE TIMES A DAY Patient taking differently: Take 300 mg by mouth 2 (two) times daily. 08/18/20   [name], NP  hydrocortisone (ANUSOL-HC) 2.5 % rectal cream Place rectally 2 (two) times daily. Patient taking differently: Place 1 application rectally 2 (two) times daily as needed for hemorrhoids. 04/11/20   [name], MD  levothyroxine (SYNTHROID) 100 MCG tablet TAKE 1/2 TABLET DAILY FOR THYROID. Patient taking differently: Take 50 mcg by mouth daily before breakfast. TAKE 1/2 TABLET DAILY FOR THYROID. 08/18/20   [name], NP  loperamide (IMODIUM A-D) 2 MG tablet Take up to 12 tablets /day as needed for  for Diarrhea Patient taking differently: Take 2 mg by mouth at bedtime. 01/30/19   [name], MD  loratadine (CLARITIN) 10 MG tablet Take 10 mg by mouth at bedtime.    [provider]  losartan (COZAAR) 50 MG tablet Take 0.5 tablets (25 mg total) by mouth 2 (two) times daily. 11/20/20 11/20/21  [name], NP  metoprolol succinate (TOPROL-XL) 50 MG 24 hr tablet Take 1 tablet (50 mg total) by mouth in the morning and at bedtime. Patient taking differently: Take 25 mg by mouth in the morning and at bedtime. 10/23/20   Sherran Needs, NP  nitroGLYCERIN (NITROSTAT) 0.4 MG SL tablet Dissolve 1 tablet under tongue every 5 minutes if needed for Angina Patient taking differently: Place 0.4 mg under the tongue every 5 (five) minutes as needed for chest pain. Dissolve 1 tablet under tongue every 5 minutes if needed for Angina 09/20/19   [name], MD  ondansetron (ZOFRAN-ODT) 8 MG disintegrating tablet Dissolve 1 tablet under tongue every 6 to 8 hours  3 x /day if needed for Nausea or Vomitting Patient taking differently: Take 8 mg by mouth every 6 (six) hours as needed for vomiting or nausea. Dissolve 1 tablet under tongue every 6 to 8 hours  3 x /day if needed for  Nausea or Vomitting 02/12/20   [name], MD  pantoprazole (PROTONIX) 40 MG tablet Take 1 tablet (40 mg total) by mouth 2 (two) times daily. Further refills by PCP  11/28/20   Bhagat, Crista Luria, PA  potassium chloride (KLOR-CON) 10 MEQ tablet Take 10 mEq by mouth daily.    [provider]  pravastatin (PRAVACHOL) 20 MG tablet Take 1 tablet (20 mg total) by mouth daily. 11/17/20   [name], [name], MD  Propylene Glycol (SYSTANE COMPLETE) 0.6 % SOLN Place 1 drop into both eyes 3 (three) times daily as needed (irritation/dry eyes.).    [provider]  simethicone (MYLICON) 80 MG chewable tablet Chew 80 mg by mouth every 6 (six) hours as needed for flatulence.    [provider]  sucralfate (CARAFATE) 1 g tablet Take 1 g by mouth in the morning, at noon, and at bedtime.    [provider]  tobramycin-dexamethasone [facility]) ophthalmic solution Place 1 drop into both eyes daily as needed (stye).     [provider]  triamcinolone cream (KENALOG) 0.1 % Apply 1 application topically 2 (two) times daily as needed (rash/skin irritation.).     [provider]  verapamil (CALAN-SR) 120 MG CR tablet TAKE 1 TABLET 2 X /DAY WITH A MEAL FOR BP & HEART RHYTHM Patient taking differently: Take 120 mg by mouth 2 (two) times daily. Take 1 tablet 2 x /day with a meal for BP & Heart Rhythm 01/28/20   [name], MD    Inpatient Medications: Scheduled Meds: . alum & mag hydroxide-simeth  15 mL Oral QPM  . vitamin C  1,000 mg Oral Daily  . [START ON 12/17/2020] clopidogrel  75 mg Oral Q breakfast  . Cyanocobalamin  5,000 mcg Sublingual Daily  . enoxaparin (LOVENOX) injection  30 mg Subcutaneous Q24H  . [START ON 12/17/2020] feeding supplement  237 mL Oral BID BM  . ferrous sulfate  325 mg Oral Q1500  . furosemide  20 mg Intravenous BID  . gabapentin  300 mg Oral BID  . [START ON 12/17/2020] levothyroxine  50 mcg Oral QAC breakfast  . loperamide  2 mg Oral  QHS  . loratadine  10 mg Oral QHS  . losartan  25 mg Oral BID  . metoprolol succinate  50 mg Oral Daily  . pantoprazole  40 mg Oral BID  . potassium chloride  10 mEq Oral Daily  . pravastatin  20 mg Oral Daily  . sodium chloride flush  3 mL Intravenous Q12H  . sodium chloride flush  3 mL Intravenous Q12H  . sodium chloride flush  3 mL Intravenous Q12H  . sucralfate  1 g Oral Q6H  . verapamil  120 mg Oral BID  . Vitamin D-3  5,000 Units Oral TID   Continuous Infusions: . sodium chloride     PRN Meds: sodium chloride, acetaminophen, acetaminophen, ALPRAZolam, erythromycin, hydrocortisone, ondansetron (ZOFRAN) IV, Propylene Glycol, simethicone, sodium chloride flush, tobramycin-dexamethasone, triamcinolone cream  Allergies:    Allergies  Allergen Reactions  . Amiodarone Other (See Comments)     Thyroid and liver and kidney problems  . Adhesive [Tape] Other (See Comments)    Tears skin, Please use "paper" tape  . Hydrocodone Other (See Comments)    HALLLUCINATIONS  . Other Other (See Comments)    NO BLOOD PRODUCTS -  Jehoval Witness  . Reglan [Metoclopramide] Hives, Itching and Rash  . Remeron [Mirtazapine] Other (See Comments)    Hallucinations and make pt loopy  . Codeine Nausea And Vomiting  . Morphine And Related Nausea And Vomiting  . Requip [Ropinirole Hcl] Other (See Comments)    Headache   . Zinc Nausea Only  Social History:   Social History   Socioeconomic History  . Marital status: Widowed    Spouse name: Not on file  . Number of children: 3  . Years of education: Not on file  . Highest education level: Not on file  Occupational History  . Not on file  Tobacco Use  . Smoking status: Never Smoker  . Smokeless tobacco: Never Used  Vaping Use  . Vaping Use: Never used  Substance and Sexual Activity  . Alcohol use: No  . Drug use: No  . Sexual activity: Not Currently    Comment: Hysterectomy  Other Topics Concern  . Not on file  Social History  Narrative   01/01/19 Lives in [place] w/dgtr, [name].  Continues to work.   Social Determinants of Health   Financial Resource Strain: Not on file  Food Insecurity: Not on file  Transportation Needs: Not on file  Physical Activity: Not on file  Stress: Not on file  Social Connections: Not on file  Intimate Partner Violence: Not on file    Family History:   Family History  Problem Relation Age of Onset  . Colon cancer Mother 31  . Heart disease Mother   . Heart disease Father   . Heart attack Father   . Heart disease Maternal Grandmother   . Stomach cancer Brother   . Esophageal cancer Neg Hx   . Rectal cancer Neg Hx     Family Status  Relation Name Status  . Mother  Deceased  . Father  Deceased  . MGM  Deceased  . MGF  Deceased  . PGM  Deceased  . PGF  Deceased  . Brother  (Not Specified)  . Neg Hx  (Not Specified)    ROS:  Please see the history of present illness.  All other ROS reviewed and negative.     Physical Exam/Data:   Vitals:   12/16/20 1430 12/16/20 1500 12/16/20 1530 12/16/20 1600  BP: (!) 159/57 (!) 141/49 (!) 137/51 (!) 173/54  Pulse: 76 61 64 63  Resp: 15 (!) 23 15 15   Temp:      TempSrc:      SpO2: 96% 97% 98% 95%  Weight:      Height:       No intake or output data in the 24 hours ending 12/16/20 1851 Last 3 Weights 12/16/2020 12/10/2020 12/03/2020  Weight (lbs) 93 lb 12.8 oz 96 lb 6.4 oz 97 lb  Weight (kg) 42.547 kg 43.727 kg 43.999 kg     Body mass index is 18.95 kg/m.  General:  Frail, elderly female, in no acute distress HEENT: normal for age Lymph: no adenopathy Neck: JVD 8 cn Endocrine:  No thryomegaly Vascular: No carotid bruits; 4/4 extremity pulses 2+ bilaterally  Cardiac:  normal S1, S2; RRR; 2/6 murmur Lungs: decreased BS R base w/ some slight rales to auscultation bilaterally elsewhere , no wheezing, rhonchi  Abd: soft, nontender, no hepatomegaly  Ext: no edema Musculoskeletal:  No deformities, BUE and BLE  strength normal and equal Skin: warm and dry, ecchymosis is almost resolved Neuro:  CNs 2-12 intact, no focal abnormalities noted Psych:  Normal affect   EKG:  The EKG was personally reviewed and demonstrates:  HR 69, A pacing Telemetry:  Telemetry was personally reviewed and demonstrates:  A pacing, +/- V pacing  Relevant CV Studies:  ECHO:  10/29/2020 1. Left ventricular ejection fraction, by estimation, is 60 to 65%. The  left ventricle has normal function.  The left ventricle has no regional  wall motion abnormalities. There is mild concentric hypertrophy and  mild-to-moderate hypertrophy of the basal  septal segment. Left ventricular diastolic parameters are consistent with  Grade II diastolic dysfunction (pseudonormalization). Elevated left atrial  pressure.  2. Right ventricular systolic function is mildly reduced. The right  ventricular size is mildly enlarged. There is normal pulmonary artery  systolic pressure.  3. Left atrial size was severely dilated.  4. Right atrial size was severely dilated.  5. The mitral valve is degenerative. Trivial mitral valve regurgitation.  Moderate mitral annular calcification.  6. The aortic valve has been repaired/replaced. A 109mm Edwards Sapien  TAVR valve is present. The valve appears well seated with normal function  by doppler interrogation. Mean gradient 65mmHg, peak gradient 27mmHg, Vmax  2.4cm/s, DI 0.47. There is  mild-to-moderate paravalvular leak most notable in the 5 o'clock position  on aortic short axis view.  7. There is mild to moderate dilatation of the ascending aorta, measuring  39 mm.  8. The inferior vena cava is normal in size with greater than 50%  respiratory variability, suggesting right atrial pressure of 3 mmHg.   Comparison(s): No significant change from prior study.   Laboratory Data:  High Sensitivity Troponin:   Recent Labs  Lab 12/16/20 1311 12/16/20 1600  TROPONINIHS 12 11      Chemistry Recent Labs  Lab 12/16/20 1311  NA 140  K 3.9  CL 109  CO2 25  GLUCOSE 90  BUN 14  CREATININE 1.01*  CALCIUM 8.6*  GFRNONAA 55*  ANIONGAP 6    Recent Labs  Lab 12/16/20 1311  PROT 6.5  ALBUMIN 2.8*  AST 21  ALT 9  ALKPHOS 86  BILITOT 1.1   Hematology Recent Labs  Lab 12/16/20 1311  WBC 6.4  RBC 3.46*  HGB 11.2*  HCT 35.5*  MCV 102.6*  MCH 32.4  MCHC 31.5  RDW 16.0*  PLT 250   BNP Recent Labs  Lab 12/16/20 1311  BNP 814.1*    DDimer No results for input(s): DDIMER in the last 168 hours.   Radiology/Studies:  DG Chest Portable 1 View  Result Date: 12/16/2020 CLINICAL DATA:  Shortness of breath. EXAM: PORTABLE CHEST 1 VIEW COMPARISON:  Chest x-ray 10/30/2019 FINDINGS: The pacer wires are in good position without complicating features. Surgical changes from prior TAVR. The cardiac silhouette, mediastinal and hilar contours are within normal limits and stable. Stable tortuosity and advanced calcification of the thoracic aorta. Moderate-sized right pleural effusion with overlying atelectasis. There is also a small left pleural effusion. No pulmonary edema or worrisome pulmonary lesions. The bony structures are grossly normal. IMPRESSION: Moderate-sized right pleural effusion and small left pleural effusion. Overlying atelectasis without definite infiltrates or pulmonary edema. Electronically Signed   By: Marijo Sanes M.D.   On: 12/16/2020 15:00     Assessment and Plan:   New Pleural Effusion Moderate Non-obstructive CAD (with small septal perforator being obstructive HFpEF PAF  Presently in SR on no AC in the setting of bleeding PAD with recent stenting and symptomatic improvement in leg heaviness CKD stage IIIa Prior melanotic GI bleed and IDA R Dc PPM MDT for Tachy-brady - given new unilateral pleural effusion ordered PA/Lateral; agree with plan to tap - will continue plavix given recent stent; when done will all procedure will re-evaluation  stroke risk with patient and daughter to discuss warfarin only vs plavix - low dose IV lasix reasonable; will continue 20 IV BID for  now and re-assess post thoracentesis - iron continued - reasonable to return home ARB though her present dose is certainly atypical - continue home pravastatin and metoprolol succinate; Verapamil is reasonable  - Ppm with 4/8 interrogation:  Was in AF with a  High burden from 12/21 to 2/22 but has improve recently  Will continue to follow   Risk Assessment/Risk Scores:     HEAR Score (for undifferentiated chest pain):  HEAR Score: 5  New York Heart Association (NYHA) Functional Class NYHA Class IV    CHA2dS2-VaSc=8(estimated yearly stroke risk according to Lip et al. is 6,7%), HASBled=5(estimated yearly bleeding risk according to pisters et al. is 12,50%)       For questions or updates, please contact [place] Please consult [email or web address] for contact info under     Rudean Haskell, [place]  [address] [place], [place] [address] [phone]  6:53 PM

## 2020-12-16 NOTE — ED Provider Notes (Signed)
  Physical Exam  BP (!) 137/51   Pulse 64   Temp 98 F (36.7 C) (Oral)   Resp 15   Ht 4\' 11"  (1.499 m)   Wt 42.5 kg   LMP  (LMP Unknown)   SpO2 98%   BMI 18.95 kg/m   Physical Exam  ED Course/Procedures     Procedures  MDM  Patient is here with shortness of breath with exertion.  Patient also has some chest pain as well.  Patient was noted to be volume overloaded.  Chest x-ray showed moderate right pleural effusion.  Signout pending labs and possible admission  4:13 PM Labs showed BNP of 800.  Her Covid test is negative.  Her creatinine is 1.  There was some concern about anemia but her hemoglobin is baseline 11 and she has no rectal bleeding.  She does get very short of breath with exertion even though her oxygen saturation remains about 95%.  At this point I recommend that she gets admitted for diuresis.  She may need a thoracentesis for further diagnosis of the type of effusion.       Drenda Freeze, MD 12/16/20 [phone]

## 2020-12-16 NOTE — Telephone Encounter (Signed)
Very difficult to know. Had recent GI bleed as well so could be more related to anemia. If she has not responded to that many Ntg I think it is best she be evaluated at the ED. They can check Hgb, CXR, enzymes, etc  Shavone Nevers [name] MD, [facility]

## 2020-12-16 NOTE — ED Notes (Signed)
Pt ambulated with assistance via walker. Pt stated she felt SOB. O2 stats dropped to 95%.

## 2020-12-16 NOTE — Telephone Encounter (Signed)
Spoke with pt's daughter, Cottie Banda (ok per DPR) regarding shortness of breath and chest pressure that has been ongoing since last night. Pt complains that she is constantly short of breath regardless of activity. Pt has trouble catching her breathe after walking through the house. Pt tried to sleep at an incline last night with some relief. Pulse ox this morning show oxygen at 94% and heart rate 77bpm.  Pt's blood pressure is elevated this morning with systolic in 169'C per daughter but she has not taken her medication this morning. Daughter states that she was holding off on medication this mroning because she was giving her mother SL nitro and didn't want to drop her blood pressure too low. Pt has now taken 3 SL nitros, at 4am with some relief, at 7:39am with no change, and at 8:19am with some relief. Daughter states that she opened a new bottle of SL nitro between the last 2 doses thinking they maybe expired since she had no change in symptoms with the 2nd dose.  Per Daughter pt's weight is good, pt had lots of urine output yesterday and she does not believe that this is a fluid issue. Pt does not have swelling and weight is actually down.   Pt does have chronic a-fib, however pt has not restarted coumadin since PV procedure with Dr. Gwenlyn Found on 11/27/20. Daughter states that they were waiting for Dr. [name] and Dr. Gwenlyn Found to decide when she needs to go back on coumadin. Pt is taking plavix for stents that were placed on 11/27/20. Pt states that she not calling this pain, just pressure.   Please advise.

## 2020-12-16 NOTE — H&P (Signed)
History and Physical    [name] YTK:[id number] DOB: 10-Dec-1933 DOA: 12/16/2020  PCP: [name], MD (Confirm with patient/family/NH records and if not entered, this has to be entered at [facility] point of entry) Patient coming from: Home  I have personally briefly reviewed patient's old medical records in [place]  Chief Complaint: SOB  HPI: [name] is a 87 y.o. female with medical history significant of paroxysmal A. Fib (Eliquis on hold due to current usage of Plavix for a PVD stenting), PPM, PVD status post left femoral stenting, HTN, chronic diastolic CHF, GERD, chronic anemia, presented with increasing shortness of breath.  Patient recently underwent left-sided femoral artery PVD stenting on 11/28/20. Then,, however daughter reported patient experienced hypotension for the 3 to 5 days after discharge SBP in lower 100s, and daughter decided to hold all her BP meds and Lasix. After her BP gradually recovered, blood pressure medication including diuresis were restarted.  However daughter noticed that patient blood pressure tends to run high in the morning, can be as high as 180s, but blood pressure normalized into 130s 140s in the evening reading.  Patient also has been in 40s fluid and salt restriction and daughter monitor her weight every day and found her weight has been stable.  No fever, no cough.  Exertional dyspnea became worse and patient developed orthopnea last night and came to ED today. ED Course: No significant hypoxia but significant increase of right-sided pleural effusion on x-ray compared to that of February this year.  Review of Systems: As per HPI otherwise 14 point review of systems negative.    Past Medical History:  Diagnosis Date  . Adenocarcinoma of stomach ([place]) 11/11/2015   gastric mass on egd,   . Anal fissure   . Anemia    hx 3/17 iron infusion, on aranesp and injected B12 since 11/2015.   [name] Anxiety   . Aortic root dilatation ([place])   .  Arthritis    oa  . Bladder cancer ([place])   . Blind left eye 2015   partial blind in left eye due to stroke   . 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 CAD (coronary artery disease)   . Cataracts, bilateral   . Chronic kidney disease    only has one kidney rt lft rem 03 ca     dr. Risa Grill, bladder cancer 05-04-2018  . Elevated LFTs    hepatic steatosis, none recent  . Endometrial cancer ([place])   . Gastroparesis   . GERD (gastroesophageal reflux disease)   . Hearing loss    wears hearing aids  . Heart murmur   . History of uterine cancer 1980s   treated with hysterectomy, external radiation and radiation seed implants.   [name] HTN (hypertension)   . Hyperlipemia   . Hypothyroidism   . Iron deficiency anemia due to chronic blood loss 11/24/2015  . Neuropathy    right leg  . Osteomyelitis ([place]) as child  . PAF (paroxysmal atrial fibrillation) ([place])   . Presence of permanent cardiac pacemaker   . Refusal of blood transfusions as patient is Jehovah's Witness   . Restless leg syndrome   . 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 Severe aortic stenosis   . Stroke [facility]) 2015   partial stroke left legally blind; left eye   . Tachycardia-bradycardia syndrome ([place])    s/p PPM by Dr  Tennant (MDT) 07/03/10  . 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 Walker as ambulation aid   . Wears glasses   . Wears partial dentures    upper    Past Surgical History:  Procedure Laterality Date  . ABDOMINAL AORTOGRAM W/LOWER EXTREMITY Bilateral 11/27/2020   Procedure: ABDOMINAL AORTOGRAM W/LOWER EXTREMITY;  Surgeon: Lorretta Harp, MD;  Location: [place] CV LAB;  Service: Cardiovascular;  Laterality: Bilateral;  limited to iliacs  . CARDIAC CATHETERIZATION  2008  . CARDIOVERSION N/A 12/05/2019   Procedure: CARDIOVERSION;  Surgeon: Elouise Munroe, MD;  Location: [facility] ENDOSCOPY;   Service: Cardiovascular;  Laterality: N/A;  . CARDIOVERSION N/A 06/13/2020   Procedure: CARDIOVERSION;  Surgeon: Buford Dresser, MD;  Location: [facility] ENDOSCOPY;  Service: Cardiovascular;  Laterality: N/A;  . CHOLECYSTECTOMY  1970s  . COLONOSCOPY N/A 11/11/2015   Procedure: COLONOSCOPY;  Surgeon: [name], MD;  [facility] ENDOSCOPY;  Service: Endoscopy;  Laterality: N/A;  . CYSTOSCOPY W/ RETROGRADES Right 05/01/2018   Procedure: CYSTOSCOPY WITH RIGHT RETROGRADE PYELOGRAM;  Surgeon: Lucas Mallow, MD;  [facility]: [facility] ORS;  Service: Urology;  Laterality: Right;  . CYSTOSCOPY W/ RETROGRADES Right 07/21/2018   Procedure: CYSTOSCOPY WITH RIGHT RETROGRADE PYELOGRAM;  Surgeon: Alexis Frock, MD;  [facility];  Service: Urology;  Laterality: Right;  . ESOPHAGOGASTRODUODENOSCOPY N/A 11/11/2015   Procedure: ESOPHAGOGASTRODUODENOSCOPY (EGD);  Surgeon: [name], MD;  Location: [facility];  Service: Endoscopy;  Laterality: N/A;  . ESOPHAGOGASTRODUODENOSCOPY N/A 02/05/2016   Procedure: ESOPHAGOGASTRODUODENOSCOPY (EGD);  Surgeon: Ladene Artist, MD;  [facility];  Service: Endoscopy;  Laterality: N/A;  . ESOPHAGOGASTRODUODENOSCOPY N/A 04/02/2016   Procedure: ESOPHAGOGASTRODUODENOSCOPY (EGD);  Surgeon: [name], MD;  Location: [facility];  Service: Endoscopy;  Laterality: N/A;  . ESOPHAGOGASTRODUODENOSCOPY (EGD) WITH PROPOFOL N/A 04/06/2017   Procedure: ESOPHAGOGASTRODUODENOSCOPY (EGD) WITH PROPOFOL;  Surgeon: [name], MD;  [facility];  Service: Endoscopy;  Laterality: N/A;  . EYE SURGERY Bilateral    remove cataracts  . GASTRECTOMY N/A 01/15/2016   Procedure: OPEN PARTIAL GASTRECTOMY;  Surgeon: Stark Klein, MD;  Location: [place];  Service: General;  Laterality: N/A;  . GASTRECTOMY Left 02/24/2016   Procedure: OPEN J TUBE;  Surgeon: Stark Klein, MD;  Location: [place];  Service: General;  Laterality: Left;  [name] GASTROJEJUNOSTOMY N/A 05/18/2016    Procedure: ROUX EN [place];  Surgeon: Stark Klein, MD;  Location: [place];  Service: General;  Laterality: N/A;  . I & D KNEE WITH POLY EXCHANGE Right 12/17/2013   Procedure: IRRIGATION AND DEBRIDEMEN RIGHT TOTAL  KNEE WITH POLY EXCHANGE;  Surgeon: Mauri Pole, MD;  [facility];  Service: Orthopedics;  Laterality: Right;  . IR GENERIC HISTORICAL  06/17/2016   IR RADIOLOGIST EVAL & MGMT 06/17/2016 Ascencion Dike, PA-C [facility] INTERV RAD  . LAPAROSCOPY N/A 01/15/2016   Procedure: LAPAROSCOPY DIAGNOSTIC;  Surgeon: Stark Klein, MD;  Location: [place];  Service: General;  Laterality: N/A;  . LYSIS OF ADHESION  05/18/2016   Procedure: LYSIS OF ADHESION;  Surgeon: Stark Klein, MD;  Location: [place];  Service: General;;  . NEPHRECTOMY Left    renal cell cancer  . PACEMAKER INSERTION  07/04/2011   MDT PPM implanted by Dr Doreatha Lew for tachy/brady; managed by Dr Thompson Grayer   . PATELLAR TENDON REPAIR Right 03/06/2014   Procedure: AVULSION WITH PATELLA TENDON REPAIR;  Surgeon: Mauri Pole, MD;  [facility];  Service: Orthopedics;  Laterality: Right;  . PATENT DUCTUS ARTERIOUS REPAIR  ~ 1949  . PERIPHERAL VASCULAR ATHERECTOMY Right 11/27/2020   Procedure: PERIPHERAL VASCULAR ATHERECTOMY;  Surgeon: Lorretta Harp, MD;  Location: [place] CV LAB;  Service: Cardiovascular;  Laterality: Right;  ext iliac  . PERIPHERAL VASCULAR INTERVENTION Right 11/27/2020   Procedure: PERIPHERAL VASCULAR INTERVENTION;  Surgeon: Lorretta Harp, MD;  Location: [place] CV LAB;  Service: Cardiovascular;  Laterality: Right;  ext iliac  . RIGHT/LEFT HEART CATH AND CORONARY ANGIOGRAPHY N/A 10/09/2019   Procedure: RIGHT/LEFT HEART CATH AND CORONARY ANGIOGRAPHY;  Surgeon: [name], [name], MD;  Location: [place] CV LAB;  Service: Cardiovascular;  Laterality: N/A;  . TEE WITHOUT CARDIOVERSION N/A 10/30/2019   Procedure: TRANSESOPHAGEAL ECHOCARDIOGRAM (TEE);  Surgeon: Burnell Blanks, MD;  Location:  [place];  Service: Open Heart Surgery;  Laterality: N/A;  . TEE WITHOUT CARDIOVERSION N/A 06/13/2020   Procedure: TRANSESOPHAGEAL ECHOCARDIOGRAM (TEE);  Surgeon: Buford Dresser, MD;  Location: [facility] ENDOSCOPY;  Service: Cardiovascular;  Laterality: N/A;  . TOTAL ABDOMINAL HYSTERECTOMY  85  . TOTAL HIP REVISION Right 2009   initial replacment surg  . TOTAL KNEE ARTHROPLASTY  02/07/2012   Procedure: TOTAL KNEE ARTHROPLASTY;  Surgeon: Mauri Pole, MD;  [facility];  Service: Orthopedics;  Laterality: Right;  . TOTAL KNEE REVISION Right 07/29/2014   Procedure: RIGHT KNEE REVISION OF PREVIOUS REPAIR EXTENSOR MECHANISM;  Surgeon: Mauri Pole, MD;  [facility];  Service: Orthopedics;  Laterality: Right;  . TRANSCATHETER AORTIC VALVE REPLACEMENT, CAROTID Left 10/30/2019   TRANSCATHETER AORTIC VALVE REPLACEMENT, LEFT CAROTID (Left Neck)   . TRANSCATHETER AORTIC VALVE REPLACEMENT, CAROTID Left 10/30/2019   Procedure: TRANSCATHETER AORTIC VALVE REPLACEMENT, LEFT CAROTID;  Surgeon: Burnell Blanks, MD;  Location: [place];  Service: Open Heart Surgery;  Laterality: Left;  . TRANSURETHRAL RESECTION OF BLADDER TUMOR N/A 07/21/2018   Procedure: TRANSURETHRAL RESECTION OF BLADDER TUMOR (TURBT);  Surgeon: Alexis Frock, MD;  [facility];  Service: Urology;  Laterality: N/A;  . TRANSURETHRAL RESECTION OF BLADDER TUMOR WITH MITOMYCIN-C N/A 05/01/2018   Procedure: TRANSURETHRAL RESECTION OF BLADDER TUMOR WITH POST OP INSTILLATION OF GEMCITABINE;  Surgeon: Lucas Mallow, MD;  [facility];  Service: Urology;  Laterality: N/A;  . WHIPPLE PROCEDURE N/A 05/18/2016   Procedure: EXPLORATORY LAPAROTOMY;  Surgeon: Stark Klein, MD;  Location: [place];  Service: General;  Laterality: N/A;     reports that she has never smoked. She has never used smokeless tobacco. She reports that she does not drink alcohol and does not use drugs.  Allergies  Allergen Reactions  . Amiodarone Other (See  Comments)     Thyroid and liver and kidney problems  . Adhesive [Tape] Other (See Comments)    Tears skin, Please use "paper" tape  . Hydrocodone Other (See Comments)    HALLLUCINATIONS  . Other Other (See Comments)    NO BLOOD PRODUCTS -  [place] Witness  . Reglan [Metoclopramide] Hives, Itching and Rash  . Remeron [Mirtazapine] Other (See Comments)    Hallucinations and make pt loopy  . Codeine Nausea And Vomiting  . Morphine And Related Nausea And Vomiting  . Requip [Ropinirole Hcl] Other (See Comments)    Headache   . Zinc Nausea Only    Family History  Problem Relation Age of Onset  . Colon cancer Mother 38  . Heart disease Mother   . Heart disease Father   . Heart attack Father   .  Heart disease Maternal Grandmother   . Stomach cancer Brother   . Esophageal cancer Neg Hx   . Rectal cancer Neg Hx      Prior to Admission medications   Medication Sig Start Date End Date Taking? Authorizing Provider  acetaminophen (TYLENOL) 500 MG tablet Take 500 mg by mouth every 8 (eight) hours as needed for moderate pain.    [provider]  ALPRAZolam Duanne Moron) 0.25 MG tablet Take 0.5-1 tablets (0.125-0.25 mg total) by mouth 2 (two) times daily as needed for anxiety. 08/18/20   [name], NP  Alum & Mag Hydroxide-Simeth (MYLANTA PO) Take 20 mLs by mouth every evening.     [provider]  amoxicillin (AMOXIL) 500 MG tablet Take 4 tablets (2,000 mg total) by mouth as directed. 1 hour prior to dental work including cleanings 11/26/19   Eileen Stanford, PA-C  Ascorbic Acid (VITAMIN C) 1000 MG tablet Take 1,000 mg by mouth daily.    [provider]  Cholecalciferol (VITAMIN D-3) 5000 units TABS Take 5,000 Units by mouth 3 (three) times daily.     [provider]  clopidogrel (PLAVIX) 75 MG tablet Take 1 tablet (75 mg total) by mouth daily with breakfast. 11/29/20   Bhagat, Crista Luria, PA  clopidogrel (PLAVIX) 75 MG tablet TAKE 1 TABLET (75 MG  TOTAL) BY MOUTH DAILY WITH BREAKFAST. 11/28/20 11/28/21  Bhagat, Bhavinkumar, PA  Cyanocobalamin 5000 MCG SUBL Place 5,000 mcg under the tongue daily.     [provider]  erythromycin ophthalmic ointment Place 1 application into both eyes at bedtime as needed (stye).     [provider]  ferrous sulfate 325 (65 FE) MG tablet Take 325 mg by mouth daily in the afternoon.    [provider]  furosemide (LASIX) 20 MG tablet Take 1 tablet (20 mg total) by mouth daily. 04/07/20 04/02/21  [name], MD  gabapentin (NEURONTIN) 300 MG capsule TAKE 1 CAPSULE BY MOUTH THREE TIMES A DAY Patient taking differently: Take 300 mg by mouth 2 (two) times daily. 08/18/20   [name], NP  hydrocortisone (ANUSOL-HC) 2.5 % rectal cream Place rectally 2 (two) times daily. Patient taking differently: Place 1 application rectally 2 (two) times daily as needed for hemorrhoids. 04/11/20   [name], MD  levothyroxine (SYNTHROID) 100 MCG tablet TAKE 1/2 TABLET DAILY FOR THYROID. Patient taking differently: Take 50 mcg by mouth daily before breakfast. TAKE 1/2 TABLET DAILY FOR THYROID. 08/18/20   [name], NP  loperamide (IMODIUM A-D) 2 MG tablet Take up to 12 tablets /day as needed for  for Diarrhea Patient taking differently: Take 2 mg by mouth at bedtime. 01/30/19   [name], MD  loratadine (CLARITIN) 10 MG tablet Take 10 mg by mouth at bedtime.    [provider]  losartan (COZAAR) 50 MG tablet Take 0.5 tablets (25 mg total) by mouth 2 (two) times daily. 11/20/20 11/20/21  [name], NP  metoprolol succinate (TOPROL-XL) 50 MG 24 hr tablet Take 1 tablet (50 mg total) by mouth in the morning and at bedtime. Patient taking differently: Take 25 mg by mouth in the morning and at bedtime. 10/23/20   Sherran Needs, NP  nitroGLYCERIN (NITROSTAT) 0.4 MG SL tablet Dissolve 1 tablet under tongue every 5 minutes if needed for Angina Patient taking differently: Place  0.4 mg under the tongue every 5 (five) minutes as needed for chest pain. Dissolve 1 tablet under tongue every 5 minutes if needed for Angina  09/20/19   [name], MD  ondansetron (ZOFRAN-ODT) 8 MG disintegrating tablet Dissolve 1 tablet under tongue every 6 to 8 hours  3 x /day if needed for Nausea or Vomitting Patient taking differently: Take 8 mg by mouth every 6 (six) hours as needed for vomiting or nausea. Dissolve 1 tablet under tongue every 6 to 8 hours  3 x /day if needed for Nausea or Vomitting 02/12/20   [name], MD  pantoprazole (PROTONIX) 40 MG tablet Take 1 tablet (40 mg total) by mouth 2 (two) times daily. Further refills by PCP 11/28/20   Leanor Kail, PA  potassium chloride (KLOR-CON) 10 MEQ tablet Take 10 mEq by mouth daily.    [provider]  pravastatin (PRAVACHOL) 20 MG tablet Take 1 tablet (20 mg total) by mouth daily. 11/17/20   [name], [name], MD  Propylene Glycol (SYSTANE COMPLETE) 0.6 % SOLN Place 1 drop into both eyes 3 (three) times daily as needed (irritation/dry eyes.).    [provider]  simethicone (MYLICON) 80 MG chewable tablet Chew 80 mg by mouth every 6 (six) hours as needed for flatulence.    [provider]  sucralfate (CARAFATE) 1 g tablet Take 1 g by mouth in the morning, at noon, and at bedtime.    [provider]  tobramycin-dexamethasone [facility]) ophthalmic solution Place 1 drop into both eyes daily as needed (stye).     [provider]  triamcinolone cream (KENALOG) 0.1 % Apply 1 application topically 2 (two) times daily as needed (rash/skin irritation.).     [provider]  verapamil (CALAN-SR) 120 MG CR tablet TAKE 1 TABLET 2 X /DAY WITH A MEAL FOR BP & HEART RHYTHM Patient taking differently: Take 120 mg by mouth 2 (two) times daily. Take 1 tablet 2 x /day with a meal for BP & Heart Rhythm 01/28/20   [name], MD    Physical Exam: Vitals:   12/16/20 1430 12/16/20 1500  12/16/20 1530 12/16/20 1600  BP: (!) 159/57 (!) 141/49 (!) 137/51 (!) 173/54  Pulse: 76 61 64 63  Resp: 15 (!) 23 15 15   Temp:      TempSrc:      SpO2: 96% 97% 98% 95%  Weight:      Height:        Constitutional: NAD, calm, comfortable Vitals:   12/16/20 1430 12/16/20 1500 12/16/20 1530 12/16/20 1600  BP: (!) 159/57 (!) 141/49 (!) 137/51 (!) 173/54  Pulse: 76 61 64 63  Resp: 15 (!) 23 15 15   Temp:      TempSrc:      SpO2: 96% 97% 98% 95%  Weight:      Height:       Eyes: PERRL, lids and conjunctivae normal ENMT: Mucous membranes are moist. Posterior pharynx clear of any exudate or lesions.Normal dentition.  Neck: normal, supple, no masses, no thyromegaly Respiratory: Diminished breathing sound on right mid field, no wheezing, no crackles. Increasing respiratory effort, talking in broken sentences. No accessory muscle use.  Cardiovascular: Regular rate and rhythm, no murmurs / rubs / gallops. No extremity edema. 2+ pedal pulses. No carotid bruits.  Abdomen: no tenderness, no masses palpated. No hepatosplenomegaly. Bowel sounds positive.  Musculoskeletal: no clubbing / cyanosis. No joint deformity upper and lower extremities. Good ROM, no contractures. Normal muscle tone.  Skin: no rashes, lesions, ulcers. No induration Neurologic: CN 2-12 grossly intact. Sensation intact, DTR normal. Strength 5/5 in all 4.  Psychiatric: Normal judgment and insight.  Alert and oriented x 3. Normal mood.     Labs on Admission: I have personally reviewed following labs and imaging studies  CBC: Recent Labs  Lab 12/16/20 1311  WBC 6.4  NEUTROABS 4.3  HGB 11.2*  HCT 35.5*  MCV 102.6*  PLT 202   Basic Metabolic Panel: Recent Labs  Lab 12/16/20 1311  NA 140  K 3.9  CL 109  CO2 25  GLUCOSE 90  BUN 14  CREATININE 1.01*  CALCIUM 8.6*   GFR: Estimated Creatinine Clearance: 27.8 mL/min (A) (by C-G formula based on SCr of 1.01 mg/dL (H)). Liver Function Tests: Recent Labs  Lab  12/16/20 1311  AST 21  ALT 9  ALKPHOS 86  BILITOT 1.1  PROT 6.5  ALBUMIN 2.8*   No results for input(s): LIPASE, AMYLASE in the last 168 hours. No results for input(s): AMMONIA in the last 168 hours. Coagulation Profile: Recent Labs  Lab 12/16/20 1311  INR 1.1   Cardiac Enzymes: No results for input(s): CKTOTAL, CKMB, CKMBINDEX, TROPONINI in the last 168 hours. BNP (last 3 results) No results for input(s): PROBNP in the last 8760 hours. HbA1C: No results for input(s): HGBA1C in the last 72 hours. CBG: No results for input(s): GLUCAP in the last 168 hours. Lipid Profile: No results for input(s): CHOL, HDL, LDLCALC, TRIG, CHOLHDL, LDLDIRECT in the last 72 hours. Thyroid Function Tests: No results for input(s): TSH, T4TOTAL, FREET4, T3FREE, THYROIDAB in the last 72 hours. Anemia Panel: No results for input(s): VITAMINB12, FOLATE, FERRITIN, TIBC, IRON, RETICCTPCT in the last 72 hours. Urine analysis:    Component Value Date/Time   COLORURINE YELLOW 04/07/2020 1101   APPEARANCEUR CLEAR 04/07/2020 1101   LABSPEC 1.009 04/07/2020 1101   PHURINE < OR = 5.0 04/07/2020 1101   GLUCOSEU NEGATIVE 04/07/2020 1101   HGBUR NEGATIVE 04/07/2020 1101   BILIRUBINUR NEGATIVE 10/26/2019 1245   KETONESUR NEGATIVE 04/07/2020 1101   PROTEINUR NEGATIVE 04/07/2020 1101   UROBILINOGEN 0.2 07/22/2014 1120   NITRITE NEGATIVE 04/07/2020 1101   LEUKOCYTESUR NEGATIVE 04/07/2020 1101    Radiological Exams on Admission: DG Chest Portable 1 View  Result Date: 12/16/2020 CLINICAL DATA:  Shortness of breath. EXAM: PORTABLE CHEST 1 VIEW COMPARISON:  Chest x-ray 10/30/2019 FINDINGS: The pacer wires are in good position without complicating features. Surgical changes from prior TAVR. The cardiac silhouette, mediastinal and hilar contours are within normal limits and stable. Stable tortuosity and advanced calcification of the thoracic aorta. Moderate-sized right pleural effusion with overlying atelectasis.  There is also a small left pleural effusion. No pulmonary edema or worrisome pulmonary lesions. The bony structures are grossly normal. IMPRESSION: Moderate-sized right pleural effusion and small left pleural effusion. Overlying atelectasis without definite infiltrates or pulmonary edema. Electronically Signed   By: Marijo Sanes M.D.   On: 12/16/2020 15:00    EKG: Independently reviewed. Paced  Assessment/Plan Active Problems:   Acute CHF (congestive heart failure) (HCC)   CHF (congestive heart failure) ([place])  (please populate well all problems here in Problem List. (For example, if patient is on BP meds at home and you resume or decide to hold them, it is a problem that needs to be her. Same for CAD, COPD, HLD and so on)  Acute on chronic diastolic CHF decompensation -Suspect uncontrolled hypertension especially at night.  Twice cardiology, agreed with increased IV diuresis for now. -Continue home BP meds. -As needed hydralazine for now.  Large right-sided pleural effusion -IR guided thoracentesis tomorrow.  Paroxysmal A. Fib -Rate  controlled, continue to hold Coumadin, radiative discussed with vascular surgery about switch from Plavix to repeat back to Coumadin sole as outpatient.  Uncontrolled HTN -As above.  Severe protein calorie malnutrition -Start Ensure -Consult nutrition  PVD -Stable  DVT prophylaxis: Lovenox  code Status: Full Code Family Communication: Daughter at bedside Disposition Plan: Expect more than 2 midnight hospital stay, for IV diuresis. PT evaluation Consults called: Cardiology Admission status: Tele admit   Lequita Halt MD [facility] Pager [phone]  12/16/2020, 6:06 PM

## 2020-12-16 NOTE — Telephone Encounter (Signed)
Pt c/o Shortness Of Breath: STAT if SOB developed within the last 24 hours or pt is noticeably SOB on the phone  1. Are you currently SOB (can you hear that pt is SOB on the phone)? yes  2. How long have you been experiencing SOB? Yesterday and gotten worse last night  3. Are you SOB when sitting or when up moving around? yes  4. Are you currently experiencing any other symptoms? No  Patient's daughter stated that her mother is have a very difficult time trying to catch her breath. Feels like someone is sitting on her chest

## 2020-12-16 NOTE — Telephone Encounter (Signed)
Spoke with [name], pt's daughter to give Dr. Doug Sou recommendations:   Very difficult to know. Had recent GI bleed as well so could be more related to anemia. If she has not responded to that many Ntg I think it is best she be evaluated at the ED. They can check Hgb, CXR, enzymes, etc  Daughter would like to know if there is another option to her going to the ED- testing that we could do, explained to [place] that our diagnostic testing can't be done on an emergent basis and that going to the ED for her to be evaluated it the best option and what Dr. [name] recommends for her to do.   Daughter verbalizes understanding and will proceed to the [facility] ED.

## 2020-12-16 NOTE — ED Provider Notes (Signed)
Emergency Department Provider Note   I have reviewed the triage vital signs and the nursing notes.   HISTORY  Chief Complaint Shortness of Breath and Chest Pain   HPI [name] is a 87 y.o. female with past medical history reviewed below including TAVR and recent femoral and iliac artery stenting presents to the emergency department with shortness of breath increasing over the past 48 hours.  Patient symptoms are worse with exertion.  She had some diffuse tightness across her chest but no pleuritic pain.  No fevers or chills.  She had difficulty sleeping last night due to shortness of breath keeping her awake.  She took nitroglycerin but her tightness in the chest did not fully resolve and returned and her shortness of breath was unchanged.  She called her cardiology doctors and they recommended ED evaluation.  No radiation of symptoms or other modifying factors.  Past Medical History:  Diagnosis Date  . Adenocarcinoma of stomach ([place]) 11/11/2015   gastric mass on egd,   . Anal fissure   . Anemia    hx 3/17 iron infusion, on aranesp and injected B12 since 11/2015.   [name] Anxiety   . Aortic root dilatation ([place])   . Arthritis    oa  . Bladder cancer ([place])   . Blind left eye 2015   partial blind in left eye due to stroke   . 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 CAD (coronary artery disease)   . Cataracts, bilateral   . Chronic kidney disease    only has one kidney rt lft rem 03 ca     dr. Risa Grill, bladder cancer 05-04-2018  . Elevated LFTs    hepatic steatosis, none recent  . Endometrial cancer ([place])   . Gastroparesis   . GERD (gastroesophageal reflux disease)   . Hearing loss    wears hearing aids  . Heart murmur   . History of uterine cancer 1980s   treated with hysterectomy, external radiation and radiation seed implants.   [name] HTN (hypertension)   . Hyperlipemia   . Hypothyroidism   . Iron deficiency anemia due to chronic blood loss 11/24/2015  . Neuropathy    right leg  .  Osteomyelitis ([place]) as child  . PAF (paroxysmal atrial fibrillation) ([place])   . Presence of permanent cardiac pacemaker   . Refusal of blood transfusions as patient is Jehovah's Witness   . Restless leg syndrome   . 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 Severe aortic stenosis   . Stroke [facility]) 2015   partial stroke left legally blind; left eye   . Tachycardia-bradycardia syndrome ([place])    s/p PPM by Dr Doreatha Lew (MDT) 07/03/10  . 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 Walker as ambulation aid   . Wears glasses   . Wears partial dentures    upper    Patient Active Problem List   Diagnosis Date Noted  . Claudication in peripheral vascular disease ([place]) 11/27/2020  . LFT elevation 08/19/2020  . Anxiety 08/18/2020  . Thoracic aortic aneurysm without rupture ([place]) 08/08/2020  . PAD (peripheral artery disease) ([place]) 08/08/2020  . Aortic atherosclerosis ([place]) 08/08/2020  . Secondary hypercoagulable state ([place]) 06/10/2020  . Cancer of ascending colon ([place]) 05/08/2020  . 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 Chronic diastolic (congestive) heart failure ([place]) 01/05/2019  . Malignant  tumor of urinary bladder ([place]) 05/11/2018  . History of stroke 04/11/2018  . Nya Monds term current use of anticoagulant therapy 03/19/2016  . S/P partial gastrectomy 02/17/2016  . Post gastrectomy syndrome   . Gastroesophageal reflux disease with esophagitis   . Iron deficiency anemia due to chronic blood loss 11/24/2015  . Refusal of blood transfusions as patient is Jehovah's Witness   . Adenocarcinoma of stomach ([place]) 11/11/2015  . CKD (chronic kidney disease), stage III ([place]) 11/10/2015  . Vitamin B12 deficiency 11/09/2015  . Essential hypertension 06/04/2014  . Vitamin D deficiency 09/24/2013  . Hyperlipidemia, mixed   . Cardiac pacemaker   08/26/2012  . S/P repair of patent ductus arteriosus 04/22/2011  . Tachycardia-bradycardia syndrome ([place]) 12/24/2010  . Atrial fibrillation ([place]) Chadsvasc 5 12/02/2010  . Malignant neoplasm of kidney excluding renal pelvis ([place]) 11/17/2007  . Hypothyroidism 11/17/2007    Past Surgical History:  Procedure Laterality Date  . ABDOMINAL AORTOGRAM W/LOWER EXTREMITY Bilateral 11/27/2020   Procedure: ABDOMINAL AORTOGRAM W/LOWER EXTREMITY;  Surgeon: Lorretta Harp, MD;  Location: [facility];  Service: Cardiovascular;  Laterality: Bilateral;  limited to iliacs  . CARDIAC CATHETERIZATION  2008  . CARDIOVERSION N/A 12/05/2019   Procedure: CARDIOVERSION;  Surgeon: Elouise Munroe, MD;  Location: [facility] ENDOSCOPY;  Service: Cardiovascular;  Laterality: N/A;  . CARDIOVERSION N/A 06/13/2020   Procedure: CARDIOVERSION;  Surgeon: Buford Dresser, MD;  Location: [facility] ENDOSCOPY;  Service: Cardiovascular;  Laterality: N/A;  . CHOLECYSTECTOMY  1970s  . COLONOSCOPY N/A 11/11/2015   Procedure: COLONOSCOPY;  Surgeon: [name], MD;  Location: [facility] ENDOSCOPY;  Service: Endoscopy;  Laterality: N/A;  . CYSTOSCOPY W/ RETROGRADES Right 05/01/2018   Procedure: CYSTOSCOPY WITH RIGHT RETROGRADE PYELOGRAM;  Surgeon: Lucas Mallow, MD;  Location: [facility];  Service: Urology;  Laterality: Right;  . CYSTOSCOPY W/ RETROGRADES Right 07/21/2018   Procedure: CYSTOSCOPY WITH RIGHT RETROGRADE PYELOGRAM;  Surgeon: Alexis Frock, MD;  [facility];  Service: Urology;  Laterality: Right;  . ESOPHAGOGASTRODUODENOSCOPY N/A 11/11/2015   Procedure: ESOPHAGOGASTRODUODENOSCOPY (EGD);  Surgeon: [name], MD;  [facility];  Service: Endoscopy;  Laterality: N/A;  . ESOPHAGOGASTRODUODENOSCOPY N/A 02/05/2016   Procedure: ESOPHAGOGASTRODUODENOSCOPY (EGD);  Surgeon: Ladene Artist, MD;  Location: [facility] ENDOSCOPY;  Service: Endoscopy;  Laterality: N/A;  . ESOPHAGOGASTRODUODENOSCOPY N/A 04/02/2016   Procedure:  ESOPHAGOGASTRODUODENOSCOPY (EGD);  Surgeon: [name], MD;  [facility]: [facility];  Service: Endoscopy;  Laterality: N/A;  . ESOPHAGOGASTRODUODENOSCOPY (EGD) WITH PROPOFOL N/A 04/06/2017   Procedure: ESOPHAGOGASTRODUODENOSCOPY (EGD) WITH PROPOFOL;  Surgeon: [name], MD;  [facility];  Service: Endoscopy;  Laterality: N/A;  . EYE SURGERY Bilateral    remove cataracts  . GASTRECTOMY N/A 01/15/2016   Procedure: OPEN PARTIAL GASTRECTOMY;  Surgeon: Stark Klein, MD;  Location: [place];  Service: General;  Laterality: N/A;  . GASTRECTOMY Left 02/24/2016   Procedure: OPEN J TUBE;  Surgeon: Stark Klein, MD;  Location: [facility];  Service: General;  Laterality: Left;  [name] GASTROJEJUNOSTOMY N/A 05/18/2016   Procedure: ROUX EN [place];  Surgeon: Stark Klein, MD;  Location: [facility];  Service: General;  Laterality: N/A;  . I & D KNEE WITH POLY EXCHANGE Right 12/17/2013   Procedure: IRRIGATION AND DEBRIDEMEN RIGHT TOTAL  KNEE WITH POLY EXCHANGE;  Surgeon: Mauri Pole, MD;  [facility];  Service: Orthopedics;  Laterality: Right;  . IR GENERIC HISTORICAL  06/17/2016   IR RADIOLOGIST EVAL & MGMT 06/17/2016 Ascencion Dike, PA-C  [facility] INTERV RAD  . LAPAROSCOPY N/A 01/15/2016   Procedure: LAPAROSCOPY DIAGNOSTIC;  Surgeon: Stark Klein, MD;  Location: [facility];  Service: General;  Laterality: N/A;  . LYSIS OF ADHESION  05/18/2016   Procedure: LYSIS OF ADHESION;  Surgeon: Stark Klein, MD;  Location: [facility];  Service: General;;  . NEPHRECTOMY Left    renal cell cancer  . PACEMAKER INSERTION  07/04/2011   MDT PPM implanted by Dr Doreatha Lew for tachy/brady; managed by Dr Thompson Grayer   . PATELLAR TENDON REPAIR Right 03/06/2014   Procedure: AVULSION WITH PATELLA TENDON REPAIR;  Surgeon: Mauri Pole, MD;  [facility];  Service: Orthopedics;  Laterality: Right;  . PATENT DUCTUS ARTERIOUS REPAIR  ~ 1949  . PERIPHERAL VASCULAR ATHERECTOMY Right 11/27/2020   Procedure: PERIPHERAL VASCULAR  ATHERECTOMY;  Surgeon: Lorretta Harp, MD;  Location: [facility];  Service: Cardiovascular;  Laterality: Right;  ext iliac  . PERIPHERAL VASCULAR INTERVENTION Right 11/27/2020   Procedure: PERIPHERAL VASCULAR INTERVENTION;  Surgeon: Lorretta Harp, MD;  Location: [facility];  Service: Cardiovascular;  Laterality: Right;  ext iliac  . RIGHT/LEFT HEART CATH AND CORONARY ANGIOGRAPHY N/A 10/09/2019   Procedure: RIGHT/LEFT HEART CATH AND CORONARY ANGIOGRAPHY;  Surgeon: [name], [name], MD;  Location: [facility];  Service: Cardiovascular;  Laterality: N/A;  . TEE WITHOUT CARDIOVERSION N/A 10/30/2019   Procedure: TRANSESOPHAGEAL ECHOCARDIOGRAM (TEE);  Surgeon: Burnell Blanks, MD;  Location: [facility];  Service: Open Heart Surgery;  Laterality: N/A;  . TEE WITHOUT CARDIOVERSION N/A 06/13/2020   Procedure: TRANSESOPHAGEAL ECHOCARDIOGRAM (TEE);  Surgeon: Buford Dresser, MD;  [facility] ENDOSCOPY;  Service: Cardiovascular;  Laterality: N/A;  . TOTAL ABDOMINAL HYSTERECTOMY  85  . TOTAL HIP REVISION Right 2009   initial replacment surg  . TOTAL KNEE ARTHROPLASTY  02/07/2012   Procedure: TOTAL KNEE ARTHROPLASTY;  Surgeon: Mauri Pole, MD;  [facility];  Service: Orthopedics;  Laterality: Right;  . TOTAL KNEE REVISION Right 07/29/2014   Procedure: RIGHT KNEE REVISION OF PREVIOUS REPAIR EXTENSOR MECHANISM;  Surgeon: Mauri Pole, MD;  [facility];  Service: Orthopedics;  Laterality: Right;  . TRANSCATHETER AORTIC VALVE REPLACEMENT, CAROTID Left 10/30/2019   TRANSCATHETER AORTIC VALVE REPLACEMENT, LEFT CAROTID (Left Neck)   . TRANSCATHETER AORTIC VALVE REPLACEMENT, CAROTID Left 10/30/2019   Procedure: TRANSCATHETER AORTIC VALVE REPLACEMENT, LEFT CAROTID;  Surgeon: Burnell Blanks, MD;  Location: [facility];  Service: Open Heart Surgery;  Laterality: Left;  . TRANSURETHRAL RESECTION OF BLADDER TUMOR N/A 07/21/2018   Procedure: TRANSURETHRAL RESECTION OF  BLADDER TUMOR (TURBT);  Surgeon: Alexis Frock, MD;  [facility];  Service: Urology;  Laterality: N/A;  . TRANSURETHRAL RESECTION OF BLADDER TUMOR WITH MITOMYCIN-C N/A 05/01/2018   Procedure: TRANSURETHRAL RESECTION OF BLADDER TUMOR WITH POST OP INSTILLATION OF GEMCITABINE;  Surgeon: Lucas Mallow, MD;  [facility];  Service: Urology;  Laterality: N/A;  . WHIPPLE PROCEDURE N/A 05/18/2016   Procedure: EXPLORATORY LAPAROTOMY;  Surgeon: Stark Klein, MD;  [facility];  Service: General;  Laterality: N/A;    Allergies Amiodarone, Adhesive [tape], Hydrocodone, Other, Reglan [metoclopramide], Remeron [mirtazapine], Codeine, Morphine and related, Requip [ropinirole hcl], and Zinc  Family History  Problem Relation Age of Onset  . Colon cancer Mother 51  . Heart disease Mother   . Heart disease Father   . Heart attack Father   . Heart disease Maternal Grandmother   . Stomach cancer Brother   .  Esophageal cancer Neg Hx   . Rectal cancer Neg Hx     Social History Social History   Tobacco Use  . Smoking status: Never Smoker  . Smokeless tobacco: Never Used  Vaping Use  . Vaping Use: Never used  Substance Use Topics  . Alcohol use: No  . Drug use: No    Review of Systems  Constitutional: No fever/chills Eyes: No visual changes. ENT: No sore throat. Cardiovascular: Positive chest pain/tightness. Respiratory: Positive shortness of breath. Gastrointestinal: No abdominal pain.  No nausea, no vomiting.  No diarrhea.  No constipation. Genitourinary: Negative for dysuria. Musculoskeletal: Negative for back pain. Skin: Negative for rash. Neurological: Negative for headaches, focal weakness or numbness.  10-point ROS otherwise negative.  ____________________________________________   PHYSICAL EXAM:  VITAL SIGNS: ED Triage Vitals  Enc Vitals Group     BP 12/16/20 1314 (!) 131/51     Pulse Rate 12/16/20 1314 65     Resp 12/16/20 1314 19     Temp 12/16/20 1314  98 F (36.7 C)     Temp Source 12/16/20 1314 Oral     SpO2 12/16/20 1314 99 %     Weight 12/16/20 1321 93 lb 12.8 oz (42.5 kg)     Height 12/16/20 1321 4\' 11"  (1.499 m)   Constitutional: Alert and oriented. Well appearing and in no acute distress. Eyes: Conjunctivae are normal.  Head: Atraumatic. Nose: No congestion/rhinnorhea. Mouth/Throat: Mucous membranes are moist.   Neck: No stridor.   Cardiovascular: Normal rate, regular rhythm. Good peripheral circulation. Grossly normal heart sounds.   Respiratory: Normal respiratory effort.  No retractions. Lungs diminished at the right base. No wheezing.  Gastrointestinal: Soft and nontender. No distention.  Musculoskeletal: No lower extremity tenderness nor edema. No gross deformities of extremities. Neurologic:  Normal speech and language. No gross focal neurologic deficits are appreciated.  Skin:  Skin is warm, dry and intact. No rash noted.  ____________________________________________   LABS (all labs ordered are listed, but only abnormal results are displayed)  Labs Reviewed  COMPREHENSIVE METABOLIC PANEL - Abnormal; Notable for the following components:      Result Value   Creatinine, Ser 1.01 (*)    Calcium 8.6 (*)    Albumin 2.8 (*)    GFR, Estimated 55 (*)    All other components within normal limits  CBC WITH DIFFERENTIAL/PLATELET - Abnormal; Notable for the following components:   RBC 3.46 (*)    Hemoglobin 11.2 (*)    HCT 35.5 (*)    MCV 102.6 (*)    RDW 16.0 (*)    All other components within normal limits  RESP PANEL BY RT-PCR (FLU A&B, COVID) ARPGX2  PROTIME-INR  BRAIN NATRIURETIC PEPTIDE  TROPONIN I (HIGH SENSITIVITY)  TROPONIN I (HIGH SENSITIVITY)   ____________________________________________  EKG   EKG Interpretation  Date/Time:  Tuesday December 16 2020 13:19:23 EDT Ventricular Rate:  69 PR Interval:  197 QRS Duration: 94 QT Interval:  496 QTC Calculation: 532 R Axis:   22 Text  Interpretation: Atrial-paced rhythm Nonspecific T abnormalities, diffuse leads Prolonged QT interval Confirmed by Nanda Quinton [phone]) on 12/16/2020 3:29:29 PM       ____________________________________________  RADIOLOGY  DG Chest Portable 1 View  Result Date: 12/16/2020 CLINICAL DATA:  Shortness of breath. EXAM: PORTABLE CHEST 1 VIEW COMPARISON:  Chest x-ray 10/30/2019 FINDINGS: The pacer wires are in good position without complicating features. Surgical changes from prior TAVR. The cardiac silhouette, mediastinal and hilar contours are within normal  limits and stable. Stable tortuosity and advanced calcification of the thoracic aorta. Moderate-sized right pleural effusion with overlying atelectasis. There is also a small left pleural effusion. No pulmonary edema or worrisome pulmonary lesions. The bony structures are grossly normal. IMPRESSION: Moderate-sized right pleural effusion and small left pleural effusion. Overlying atelectasis without definite infiltrates or pulmonary edema. Electronically Signed   By: Marijo Sanes M.D.   On: 12/16/2020 15:00    ____________________________________________   PROCEDURES  Procedure(s) performed:   Procedures  None  ____________________________________________   INITIAL IMPRESSION / ASSESSMENT AND PLAN / ED COURSE  Pertinent labs & imaging results that were available during my care of the patient were reviewed by me and considered in my medical decision making (see chart for details).   Patient presents to the emergency department for evaluation of increased shortness of breath.  Chest x-ray shows a moderate sized right pleural effusion which correlates with her exam.  She is not hypoxemic but is having significant symptoms and difficulty sleeping due to shortness of breath.  Lab work is pending.  My suspicion for ACS/PE is very low.  Patient's troponin is back and within normal limits.  Additional blood work and Covid test are pending.  Care  transferred to Dr. Darl Householder pending labs with likely diuresis and admit.    ____________________________________________  FINAL CLINICAL IMPRESSION(S) / ED DIAGNOSES  Final diagnoses:  Shortness of breath  Pleural effusion on right     MEDICATIONS GIVEN DURING THIS VISIT:  Medications  furosemide (LASIX) injection 40 mg (has no administration in time range)     Note:  This document was prepared using Dragon voice recognition software and may include unintentional dictation errors.  Nanda Quinton, MD, [facility] Emergency Medicine    Lalonnie Shaffer, [facility], MD 12/16/20 [phone]

## 2020-12-16 NOTE — ED Triage Notes (Signed)
Pt bib ems from home. Pt c/o sob since last night. Pt says she has sob while resting and on exertion. She started having chest pain today and was given 3 nitroglycerin and Plavix this morning. Pt has pacemaker.

## 2020-12-17 ENCOUNTER — Inpatient Hospital Stay (HOSPITAL_COMMUNITY): Payer: Medicare Other

## 2020-12-17 ENCOUNTER — Encounter (HOSPITAL_COMMUNITY): Payer: Self-pay | Admitting: Internal Medicine

## 2020-12-17 DIAGNOSIS — I5031 Acute diastolic (congestive) heart failure: Secondary | ICD-10-CM

## 2020-12-17 DIAGNOSIS — E039 Hypothyroidism, unspecified: Secondary | ICD-10-CM

## 2020-12-17 HISTORY — PX: IR THORACENTESIS ASP PLEURAL SPACE W/IMG GUIDE: IMG5380

## 2020-12-17 LAB — BASIC METABOLIC PANEL
Anion gap: 7 (ref 5–15)
BUN: 13 mg/dL (ref 8–23)
CO2: 28 mmol/L (ref 22–32)
Calcium: 8.4 mg/dL — ABNORMAL LOW (ref 8.9–10.3)
Chloride: 107 mmol/L (ref 98–111)
Creatinine, Ser: 1.07 mg/dL — ABNORMAL HIGH (ref 0.44–1.00)
GFR, Estimated: 51 mL/min — ABNORMAL LOW (ref >60)
Glucose, Bld: 77 mg/dL (ref 70–99)
Potassium: 3.5 mmol/L (ref 3.5–5.1)
Sodium: 142 mmol/L (ref 135–145)

## 2020-12-17 LAB — BODY FLUID CELL COUNT WITH DIFFERENTIAL
Eos, Fluid: 0 %
Lymphs, Fluid: 49 %
Monocyte-Macrophage-Serous Fluid: 8 % — ABNORMAL LOW (ref 50–90)
Neutrophil Count, Fluid: 43 % — ABNORMAL HIGH (ref 0–25)
Total Nucleated Cell Count, Fluid: 421 cu mm (ref 0–1000)

## 2020-12-17 LAB — IRON AND TIBC
Iron: 49 ug/dL (ref 28–170)
Saturation Ratios: 18 % (ref 10.4–31.8)
TIBC: 266 ug/dL (ref 250–450)
UIBC: 217 ug/dL

## 2020-12-17 LAB — GLUCOSE, PLEURAL OR PERITONEAL FLUID: Glucose, Fluid: 100 mg/dL

## 2020-12-17 LAB — GRAM STAIN

## 2020-12-17 LAB — ALBUMIN, PLEURAL OR PERITONEAL FLUID: Albumin, Fluid: 1.6 g/dL

## 2020-12-17 MED ORDER — ADULT MULTIVITAMIN W/MINERALS CH
1.0000 | ORAL_TABLET | Freq: Every day | ORAL | Status: DC
  Filled 2020-12-17: qty 1

## 2020-12-17 MED ORDER — LIDOCAINE HCL 1 % IJ SOLN
INTRAMUSCULAR | Status: AC
  Filled 2020-12-17: qty 20

## 2020-12-17 NOTE — Progress Notes (Signed)
Progress Note  Patient Name: [name] Date of Encounter: 12/17/2020  Primary Cardiologist: [name], MD   Subjective   No events overnight.  Still awaiting thoracentesis (through exam IR came to transport patient).  Inpatient Medications    Scheduled Meds: . alum & mag hydroxide-simeth  15 mL Oral QPM  . vitamin C  1,000 mg Oral Daily  . cholecalciferol  5,000 Units Oral TID  . clopidogrel  75 mg Oral Q breakfast  . enoxaparin (LOVENOX) injection  30 mg Subcutaneous Q24H  . feeding supplement  237 mL Oral BID BM  . ferrous sulfate  325 mg Oral Q1500  . furosemide  20 mg Intravenous BID  . gabapentin  300 mg Oral BID  . levothyroxine  50 mcg Oral QAC breakfast  . lidocaine      . loperamide  2 mg Oral QHS  . loratadine  10 mg Oral QHS  . losartan  25 mg Oral BID  . metoprolol succinate  50 mg Oral Daily  . pantoprazole  40 mg Oral BID  . potassium chloride  10 mEq Oral Daily  . pravastatin  20 mg Oral Daily  . sodium chloride flush  3 mL Intravenous Q12H  . sodium chloride flush  3 mL Intravenous Q12H  . sucralfate  1 g Oral Q6H  . verapamil  120 mg Oral BID  . vitamin B-12  5,000 mcg Oral Daily   Continuous Infusions: . sodium chloride     PRN Meds: sodium chloride, acetaminophen, ALPRAZolam, erythromycin, hydrocortisone, ondansetron (ZOFRAN) IV, polyvinyl alcohol, simethicone, sodium chloride flush, tobramycin-dexamethasone, triamcinolone cream   Vital Signs    Vitals:   12/16/20 2241 12/17/20 0038 12/17/20 0407 12/17/20 0727  BP: 140/67 (!) 150/67 138/74 (!) 145/58  Pulse: 73 77 79 77  Resp:  19 18 20   Temp:  98.2 F (36.8 C) 98.1 F (36.7 C) 98.6 F (37 C)  TempSrc:  Oral Oral Oral  SpO2: 95% 97% 97% 95%  Weight:   39.6 kg   Height:        Intake/Output Summary (Last 24 hours) at 12/17/2020 0937 Last data filed at 12/17/2020 0845 Gross per 24 hour  Intake 600 ml  Output 1800 ml  Net -1200 ml   Filed Weights   12/16/20 1321 12/16/20  2003 12/17/20 0407  Weight: 42.5 kg 42.5 kg 39.6 kg    Telemetry    SR to occasional A pacing - Personally Reviewed  ECG    No new - Personally Reviewed  Physical Exam   GEN: No acute distress.   Neck: No JVD Cardiac: RRR, no murmurs, rubs, or gallops.  Respiratory: R unilateral decreased breath sounds with good air movement in other lung fields GI: Soft, nontender, non-distended  MS: No edema; No deformity. Neuro:  Nonfocal  Psych: Normal affect   Labs    Chemistry Recent Labs  Lab 12/16/20 1311 12/17/20 0517  NA 140 142  K 3.9 3.5  CL 109 107  CO2 25 28  GLUCOSE 90 77  BUN 14 13  CREATININE 1.01* 1.07*  CALCIUM 8.6* 8.4*  PROT 6.5  --   ALBUMIN 2.8*  --   AST 21  --   ALT 9  --   ALKPHOS 86  --   BILITOT 1.1  --   GFRNONAA 55* 51*  ANIONGAP 6 7     Hematology Recent Labs  Lab 12/16/20 1311  WBC 6.4  RBC 3.46*  HGB 11.2*  HCT  35.5*  MCV 102.6*  MCH 32.4  MCHC 31.5  RDW 16.0*  PLT 250    Cardiac EnzymesNo results for input(s): TROPONINI in the last 168 hours. No results for input(s): TROPIPOC in the last 168 hours.   BNP Recent Labs  Lab 12/16/20 1311  BNP 814.1*     DDimer No results for input(s): DDIMER in the last 168 hours.   Radiology    DG Chest Right Decubitus  Result Date: 12/16/2020 CLINICAL DATA:  History of right pleural effusion, shortness of breath EXAM: CHEST - RIGHT DECUBITUS COMPARISON:  12/16/2020 FINDINGS: Right-side-down decubitus view of the chest. Moderate layering right pleural effusion. Apparent small layering left pleural effusion. Airspace disease at the right base. Stable cardiomediastinal silhouette with aortic atherosclerosis. Right-sided pacing device and valve prosthesis. IMPRESSION: Moderate layering right pleural effusion. Probable small layering left pleural effusion. Electronically Signed   By: Donavan Foil M.D.   On: 12/16/2020 19:12   DG Chest Portable 1 View  Result Date: 12/16/2020 CLINICAL DATA:   Shortness of breath. EXAM: PORTABLE CHEST 1 VIEW COMPARISON:  Chest x-ray 10/30/2019 FINDINGS: The pacer wires are in good position without complicating features. Surgical changes from prior TAVR. The cardiac silhouette, mediastinal and hilar contours are within normal limits and stable. Stable tortuosity and advanced calcification of the thoracic aorta. Moderate-sized right pleural effusion with overlying atelectasis. There is also a small left pleural effusion. No pulmonary edema or worrisome pulmonary lesions. The bony structures are grossly normal. IMPRESSION: Moderate-sized right pleural effusion and small left pleural effusion. Overlying atelectasis without definite infiltrates or pulmonary edema. Electronically Signed   By: Marijo Sanes M.D.   On: 12/16/2020 15:00    Cardiac Studies    Transthoracic Echocardiogram: Date: 10/29/20 Results: 1. Left ventricular ejection fraction, by estimation, is 60 to 65%. The  left ventricle has normal function. The left ventricle has no regional  wall motion abnormalities. There is mild concentric hypertrophy and  mild-to-moderate hypertrophy of the basal  septal segment. Left ventricular diastolic parameters are consistent with  Grade II diastolic dysfunction (pseudonormalization). Elevated left atrial  pressure.  2. Right ventricular systolic function is mildly reduced. The right  ventricular size is mildly enlarged. There is normal pulmonary artery  systolic pressure.  3. Left atrial size was severely dilated.  4. Right atrial size was severely dilated.  5. The mitral valve is degenerative. Trivial mitral valve regurgitation.  Moderate mitral annular calcification.  6. The aortic valve has been repaired/replaced. A 36mm Edwards Sapien  TAVR valve is present. The valve appears well seated with normal function  by doppler interrogation. Mean gradient 21mmHg, peak gradient 24mmHg, Vmax  2.4cm/s, DI 0.47. There is  mild-to-moderate paravalvular  leak most notable in the 5 o'clock position  on aortic short axis view.  7. There is mild to moderate dilatation of the ascending aorta, measuring  39 mm.  8. The inferior vena cava is normal in size with greater than 50%  respiratory variability, suggesting right atrial pressure of 3 mmHg.   Patient Profile     87 y.o. female history of Moderate non obstructive CAD, PAF, PAD with recent stenting, prior GI bleed and CKD stage IIIA who presented with weakness and R sided pleural effusion  Assessment & Plan    New Pleural Effusion Moderate Non-obstructive CAD (with small septal perforator being obstructive HFpEF PAF  Presently in SR on no AC in the setting of bleeding PAD with recent stenting and symptomatic improvement in leg heaviness  CKD stage IIIa Prior melanotic GI bleed and IDA R Dc PPM MDT for Tachy-brady - pending thoracentesis - will continue plavix given recent stent; when done will all procedure will re-evaluation stroke risk with patient and daughter to discuss warfarin only vs plavix (through review of notes, plan for plavix appears reasonable in short terum - low dose IV lasix reasonable for left sided small effusion; will continue 20 IV BID for now and re-assess post thoracentesis - iron continued - on home ARB, CCB, and BB  For questions or updates, please contact [facility] Please consult [email or web address] for contact info under Cardiology/STEMI.      Signed, Werner Lean, MD  12/17/2020, 9:37 AM

## 2020-12-17 NOTE — Progress Notes (Signed)
Initial Nutrition Assessment  DOCUMENTATION CODES:   Underweight  INTERVENTION:   -D/c Ensure Enlive  -Magic cup TID with meals, each supplement provides 290 kcal and 9 grams of protein -Snacks TID between meals  NUTRITION DIAGNOSIS:   Increased nutrient needs related to chronic illness (CHF) as evidenced by estimated needs.  GOAL:   Patient will meet greater than or equal to 90% of their needs  MONITOR:   PO intake,Supplement acceptance,Labs,Weight trends,Skin,I & O's  REASON FOR ASSESSMENT:   Consult Assessment of nutrition requirement/status  ASSESSMENT:   [name] is a 87 y.o. female with medical history significant of paroxysmal A. Fib (Eliquis on hold due to current usage of Plavix for a PVD stenting), PPM, PVD status post left femoral stenting, HTN, chronic diastolic CHF, GERD, chronic anemia, presented with increasing shortness of breath.  Pt admitted with CHF.   Reviewed I/O's: -620 ml x 24 hours  UOP: 1.1 L x 24 hours  Pt unavailable at time of visit. Attempted to speak with pt via call to hospital room phone, however, no answer. RD unable to obtain further nutrition-related history or complete nutrition-focused physical exam at this time.   Pt is very familiar RD due to multiple previous admissions. Pt typically has a poor appetite, but also with poor acceptance of oral nutrition supplements. Pt with a very supportive family who offer her preferred foods throughout the day to help increase oral intake. Noted meal completion 50%  Reviewed wt hx; pt has experienced a 12% wt loss over the past 3 months, which is significant for time frame. Pt has history of malnutrition and highly suspect that this is ongoing, however, RD unable to identify at this time.  Medications reviewed and include vitamin C, vitamin D3, and vitmain B-12.   Labs reviewed.   Diet Order:   Diet Order            Diet Heart Room service appropriate? Yes; Fluid consistency: Thin;  Fluid restriction: 1800 mL Fluid  Diet effective now                 EDUCATION NEEDS:   No education needs have been identified at this time  Skin:  Skin Assessment: Reviewed RN Assessment  Last BM:  12/17/20  Height:   Ht Readings from Last 1 Encounters:  12/16/20 4\' 11"  (1.499 m)    Weight:   Wt Readings from Last 1 Encounters:  12/17/20 39.6 kg    Ideal Body Weight:  44.7 kg  BMI:  Body mass index is 17.61 kg/m.  Estimated Nutritional Needs:   Kcal:  1400-1600  Protein:  65-80 grams  Fluid:  > 1.4 L    Loistine Chance, RD, LDN, [place] Registered Dietitian II Certified Diabetes Care and Education Specialist Please refer to [facility] for RD and/or RD on-call/weekend/after hours pager

## 2020-12-17 NOTE — Progress Notes (Signed)
PROGRESS NOTE    [name]   ZOX:[id number]  DOB: 23-May-1934  DOA: 12/16/2020 PCP: [name], MD   Brief Narrative:  [name] is an 87 year old female with paroxysmal A. fib, pacemaker, peripheral vascular disease status post left femoral stent on Plavix, chronic diastolic heart failure, GERD, anemia who presented to the hospital on 4/12. She came for shortness of breath and swelling of her legs.  She was diagnosed with acute congestive heart failure and admitted to the hospital.   Subjective: Shortness of breath is getting better but has not resolved.  Swelling of her legs has improved.    Assessment & Plan:   Principal Problem:   Acute diastolic CHF (congestive heart failure) - grade 2 d CHF on ECHO from 10/29/20 - She remains symptomatic -Continue IV Lasix per cardiology  Active Problems: Right sided pleural effusion - s/p thoracentesis - 800 cc of cloudy fluid - appears to be transudate    Hypothyroidism - cont Synthroid    Atrial fibrillation (HCC) Chadsvasc 5 -   NOAC on hold as she is on Plavix    Tachycardia-bradycardia syndrome  - s/p pacer    CKD (chronic kidney disease), stage IIIb - stable - follow    S/P TAVR (transcatheter aortic valve replacement)    PAD (peripheral artery disease)  S/p stenting  -cont Plavix   Time spent in minutes: 35 DVT prophylaxis: enoxaparin (LOVENOX) injection 30 mg Start: 12/16/20 1800 Code Status: DNR Family Communication: daughter at bedside Level of Care: Level of care: Telemetry Medical Disposition Plan:  Status is: Inpatient  Remains inpatient appropriate because:IV treatments appropriate due to intensity of illness or inability to take PO   Dispo: The patient is from: Home              Anticipated d/c is to: Home              Patient currently is not medically stable to d/c.   Difficult to place patient No Consultants:   cardiology Procedures:   Right sided  toracentesis Antimicrobials:  Anti-infectives (From admission, onward)   None      Objective: Vitals:   12/17/20 1100 12/17/20 1200 12/17/20 1300 12/17/20 1400  BP: (!) 114/49 (!) 116/52 (!) 114/49 (!) 118/51  Pulse: 65 65  64  Resp: 20     Temp: 98.4 F (36.9 C)     TempSrc: Oral     SpO2: 97% 99% 99% 94%  Weight:      Height:        Intake/Output Summary (Last 24 hours) at 12/17/2020 1737 Last data filed at 12/17/2020 0845 Gross per 24 hour  Intake 600 ml  Output 1800 ml  Net -1200 ml   Filed Weights   12/16/20 1321 12/16/20 2003 12/17/20 0407  Weight: 42.5 kg 42.5 kg 39.6 kg    Examination: General exam: Appears comfortable  HEENT: PERRLA, oral mucosa moist, no sclera icterus or thrush Respiratory system: crackles at bases- Respiratory effort normal. Cardiovascular system: S1 & S2 heard, RRR.   Gastrointestinal system: Abdomen soft, non-tender, nondistended. Normal bowel sounds. Central nervous system: Alert and oriented. No focal neurological deficits. Extremities: No cyanosis, clubbing or edema Skin: No rashes or ulcers Psychiatry:  Mood & affect appropriate.     Data Reviewed: I have personally reviewed following labs and imaging studies  CBC: Recent Labs  Lab 12/16/20 1311  WBC 6.4  NEUTROABS 4.3  HGB 11.2*  HCT 35.5*  MCV 102.6*  PLT  588   Basic Metabolic Panel: Recent Labs  Lab 12/16/20 1311 12/17/20 0517  NA 140 142  K 3.9 3.5  CL 109 107  CO2 25 28  GLUCOSE 90 77  BUN 14 13  CREATININE 1.01* 1.07*  CALCIUM 8.6* 8.4*   GFR: Estimated Creatinine Clearance: 24.5 mL/min (A) (by C-G formula based on SCr of 1.07 mg/dL (H)). Liver Function Tests: Recent Labs  Lab 12/16/20 1311  AST 21  ALT 9  ALKPHOS 86  BILITOT 1.1  PROT 6.5  ALBUMIN 2.8*   No results for input(s): LIPASE, AMYLASE in the last 168 hours. No results for input(s): AMMONIA in the last 168 hours. Coagulation Profile: Recent Labs  Lab 12/16/20 1311  INR 1.1    Cardiac Enzymes: No results for input(s): CKTOTAL, CKMB, CKMBINDEX, TROPONINI in the last 168 hours. BNP (last 3 results) No results for input(s): PROBNP in the last 8760 hours. HbA1C: No results for input(s): HGBA1C in the last 72 hours. CBG: No results for input(s): GLUCAP in the last 168 hours. Lipid Profile: No results for input(s): CHOL, HDL, LDLCALC, TRIG, CHOLHDL, LDLDIRECT in the last 72 hours. Thyroid Function Tests: No results for input(s): TSH, T4TOTAL, FREET4, T3FREE, THYROIDAB in the last 72 hours. Anemia Panel: Recent Labs    12/17/20 0517  TIBC 266  IRON 49   Urine analysis:    Component Value Date/Time   COLORURINE YELLOW 04/07/2020 1101   APPEARANCEUR CLEAR 04/07/2020 1101   LABSPEC 1.009 04/07/2020 1101   PHURINE < OR = 5.0 04/07/2020 1101   GLUCOSEU NEGATIVE 04/07/2020 1101   HGBUR NEGATIVE 04/07/2020 1101   BILIRUBINUR NEGATIVE 10/26/2019 1245   KETONESUR NEGATIVE 04/07/2020 1101   PROTEINUR NEGATIVE 04/07/2020 1101   UROBILINOGEN 0.2 07/22/2014 1120   NITRITE NEGATIVE 04/07/2020 1101   LEUKOCYTESUR NEGATIVE 04/07/2020 1101   Sepsis Labs: @LABRCNTIP (procalcitonin:4,lacticidven:4) ) Recent Results (from the past 240 hour(s))  Resp Panel by RT-PCR (Flu A&B, Covid) Nasopharyngeal Swab     Status: None   Collection Time: 12/16/20  2:01 PM   Specimen: Nasopharyngeal Swab; Nasopharyngeal(NP) swabs in vial transport medium  Result Value Ref Range Status   SARS Coronavirus 2 by RT PCR NEGATIVE NEGATIVE Final    Comment: (NOTE) SARS-CoV-2 target nucleic acids are NOT DETECTED.  The SARS-CoV-2 RNA is generally detectable in upper respiratory specimens during the acute phase of infection. The lowest concentration of SARS-CoV-2 viral copies this assay can detect is 138 copies/mL. A negative result does not preclude SARS-Cov-2 infection and should not be used as the sole basis for treatment or other patient management decisions. A negative result may  occur with  improper specimen collection/handling, submission of specimen other than nasopharyngeal swab, presence of viral mutation(s) within the areas targeted by this assay, and inadequate number of viral copies(<138 copies/mL). A negative result must be combined with clinical observations, patient history, and epidemiological information. The expected result is Negative.  Fact Sheet for Patients:  EntrepreneurPulse.com.au  Fact Sheet for Healthcare Providers:  IncredibleEmployment.be  This test is no t yet approved or cleared by the [place] FDA and  has been authorized for detection and/or diagnosis of SARS-CoV-2 by FDA under an Emergency Use Authorization (EUA). This EUA will remain  in effect (meaning this test can be used) for the duration of the COVID-19 declaration under Section 564(b)(1) of the Act, 21 U.S.C.section 360bbb-3(b)(1), unless the authorization is terminated  or revoked sooner.       Influenza A by PCR NEGATIVE NEGATIVE  Final   Influenza B by PCR NEGATIVE NEGATIVE Final    Comment: (NOTE) The Xpert Xpress SARS-CoV-2/FLU/RSV plus assay is intended as an aid in the diagnosis of influenza from Nasopharyngeal swab specimens and should not be used as a sole basis for treatment. Nasal washings and aspirates are unacceptable for Xpert Xpress SARS-CoV-2/FLU/RSV testing.  Fact Sheet for Patients: EntrepreneurPulse.com.au  Fact Sheet for Healthcare Providers: IncredibleEmployment.be  This test is not yet approved or cleared by the [place] FDA and has been authorized for detection and/or diagnosis of SARS-CoV-2 by FDA under an Emergency Use Authorization (EUA). This EUA will remain in effect (meaning this test can be used) for the duration of the COVID-19 declaration under Section 564(b)(1) of the Act, 21 U.S.C. section 360bbb-3(b)(1), unless the authorization is terminated  or revoked.  Performed at [facility], [place] [address] [place], [place] [address]   Gram stain     Status: None   Collection Time: 12/17/20 11:20 AM   Specimen: Lung, Right; Pleural Fluid  Result Value Ref Range Status   Specimen Description PLEURAL FLUID LUNG RIGHT  Final   Special Requests PLEURAL FLUID RIGHT LUNG  Final   Gram Stain   Final    WBC PRESENT,BOTH PMN AND MONONUCLEAR NO ORGANISMS SEEN CYTOSPIN SMEAR Performed at [facility], [address], [place], [place] [address]    Report Status 12/17/2020 FINAL  Final         Radiology Studies: DG Chest 1 View  Result Date: 12/17/2020 CLINICAL DATA:  Post thoracentesis EXAM: CHEST  1 VIEW COMPARISON:  12/16/2020 FINDINGS: Decreasing right pleural effusion with small to moderate right effusion remaining. No pneumothorax. Right pacer remains in place, unchanged. Prior TAVR. Mild cardiomegaly, aortic atherosclerosis. Bibasilar atelectasis. IMPRESSION: Decreasing right effusion following thoracentesis.  No pneumothorax. Small to moderate right pleural effusion.  Bibasilar atelectasis. Electronically Signed   By: Rolm Baptise M.D.   On: 12/17/2020 11:37   DG Chest Right Decubitus  Result Date: 12/16/2020 CLINICAL DATA:  History of right pleural effusion, shortness of breath EXAM: CHEST - RIGHT DECUBITUS COMPARISON:  12/16/2020 FINDINGS: Right-side-down decubitus view of the chest. Moderate layering right pleural effusion. Apparent small layering left pleural effusion. Airspace disease at the right base. Stable cardiomediastinal silhouette with aortic atherosclerosis. Right-sided pacing device and valve prosthesis. IMPRESSION: Moderate layering right pleural effusion. Probable small layering left pleural effusion. Electronically Signed   By: Donavan Foil M.D.   On: 12/16/2020 19:12   DG Chest Portable 1 View  Result Date: 12/16/2020 CLINICAL DATA:  Shortness of breath. EXAM: PORTABLE CHEST 1 VIEW COMPARISON:  Chest  x-ray 10/30/2019 FINDINGS: The pacer wires are in good position without complicating features. Surgical changes from prior TAVR. The cardiac silhouette, mediastinal and hilar contours are within normal limits and stable. Stable tortuosity and advanced calcification of the thoracic aorta. Moderate-sized right pleural effusion with overlying atelectasis. There is also a small left pleural effusion. No pulmonary edema or worrisome pulmonary lesions. The bony structures are grossly normal. IMPRESSION: Moderate-sized right pleural effusion and small left pleural effusion. Overlying atelectasis without definite infiltrates or pulmonary edema. Electronically Signed   By: Marijo Sanes M.D.   On: 12/16/2020 15:00   IR THORACENTESIS ASP PLEURAL SPACE W/IMG GUIDE  Result Date: 12/17/2020 INDICATION: Patient with history of congestive heart failure, new right pleural effusion. Request for diagnostic and therapeutic right thoracentesis. EXAM: ULTRASOUND GUIDED DIAGNOSTIC AND THERAPEUTIC RIGHT THORACENTESIS MEDICATIONS: 10 mL 1% lidocaine COMPLICATIONS: None  immediate. PROCEDURE: An ultrasound guided thoracentesis was thoroughly discussed with the patient and questions answered. The benefits, risks, alternatives and complications were also discussed. The patient understands and wishes to proceed with the procedure. Written consent was obtained. Ultrasound was performed to localize and mark an adequate pocket of fluid in the right chest. The area was then prepped and draped in the normal sterile fashion. 1% Lidocaine was used for local anesthesia. Under ultrasound guidance a 6 Fr Safe-T-Centesis catheter was introduced. Thoracentesis was performed. The catheter was removed and a dressing applied. FINDINGS: A total of approximately 800 mL of cloudy, yellow fluid was removed. Samples were sent to the laboratory as requested by the clinical team. IMPRESSION: Successful ultrasound guided diagnostic and therapeutic right  thoracentesis yielding 800 mL of pleural fluid. Read by: Brynda Greathouse PA-C Electronically Signed   By: Corrie Mckusick D.O.   On: 12/17/2020 11:18      Scheduled Meds: . alum & mag hydroxide-simeth  15 mL Oral QPM  . vitamin C  1,000 mg Oral Daily  . cholecalciferol  5,000 Units Oral TID  . clopidogrel  75 mg Oral Q breakfast  . enoxaparin (LOVENOX) injection  30 mg Subcutaneous Q24H  . ferrous sulfate  325 mg Oral Q1500  . furosemide  20 mg Intravenous BID  . gabapentin  300 mg Oral BID  . levothyroxine  50 mcg Oral QAC breakfast  . lidocaine      . loperamide  2 mg Oral QHS  . loratadine  10 mg Oral QHS  . losartan  25 mg Oral BID  . metoprolol succinate  50 mg Oral Daily  . multivitamin with minerals  1 tablet Oral Daily  . pantoprazole  40 mg Oral BID  . potassium chloride  10 mEq Oral Daily  . pravastatin  20 mg Oral Daily  . sodium chloride flush  3 mL Intravenous Q12H  . sodium chloride flush  3 mL Intravenous Q12H  . sucralfate  1 g Oral Q6H  . verapamil  120 mg Oral BID  . vitamin B-12  5,000 mcg Oral Daily   Continuous Infusions: . sodium chloride       LOS: 1 day      Debbe Odea, MD [facility] Pager: [email or web address] 12/17/2020, 5:37 PM

## 2020-12-17 NOTE — Evaluation (Signed)
Physical Therapy Evaluation Patient Details Name: [name] MRN: [id number] DOB: 1933/12/12 Today's Date: 12/17/2020   History of Present Illness  Patient is a 87 y/o female who presents on 12/17/20 with SOB and chest pressure. CXR- moderate right pleural effusion. Plan for thoracentesis 4/13? Admitted with acute on chronic CHF. PMH includes CHF, CAD, CKD, hx of CVA, adenocarcinoma of stomach s/p partial gastrectomy 2017, HTN, s/p TAVR, PAF, endometrial ca.  Clinical Impression  Patient presents with generalized weakness, dyspnea on exertion, impaired balance and impaired mobility s/p above. Pt lives at home with daughter and reports being Mod I for ADLs, some IADLs and uses rollator vs RW for ambulation. Reports no falls. Able to negotiate stairs at home. Today, pt tolerated transfers and gait training with Min guard-Min A for balance/safety. VSS on RA with SP02 >95% and HR 80s-106 bpm max with 2/4 DOE. Encouraged OOB and increasing mobility re: walking to toilet with assist. Will follow acutely to maximize independence and mobility prior to return home.    Follow Up Recommendations Home health PT;Supervision - Intermittent (pending progress)    Equipment Recommendations  None recommended by PT    Recommendations for Other Services       Precautions / Restrictions Precautions Precautions: Fall Restrictions Weight Bearing Restrictions: No      Mobility  Bed Mobility Overal bed mobility: Needs Assistance Bed Mobility: Supine to Sit     Supine to sit: Supervision;HOB elevated     General bed mobility comments: Use of rail, no physical assist needed.    Transfers Overall transfer level: Needs assistance Equipment used: Rolling walker (2 wheeled) Transfers: Sit to/from Stand Sit to Stand: Min guard;Min assist         General transfer comment: Min guard for safety. Stood from [employer], from toilet x1. Min A needed to stand from low toilet surface. Transferred to chair  post ambulation.  Ambulation/Gait Ambulation/Gait assistance: Min guard Gait Distance (Feet): 20 Feet (+30') Assistive device: Rolling walker (2 wheeled) Gait Pattern/deviations: Step-through pattern;Decreased stride length;Trunk flexed Gait velocity: decreased   General Gait Details: Slow, mostly steady gait with RW for support; 2/4 DOE. VSS on RA with Sp02 >95%. HR 80s-106 bpm max.  Stairs            Wheelchair Mobility    Modified Rankin (Stroke Patients Only)       Balance Overall balance assessment: Needs assistance Sitting-balance support: Feet supported;No upper extremity supported Sitting balance-Leahy Scale: Good Sitting balance - Comments: Able to perform pericare without assist,.   Standing balance support: During functional activity Standing balance-Leahy Scale: Fair Standing balance comment: ABle to stand at sink and wash hands with close min guard.                             Pertinent Vitals/Pain Pain Assessment: No/denies pain    Home Living Family/patient expects to be discharged to:: Private residence Living Arrangements: Children Available Help at Discharge: Family;Available PRN/intermittently Type of Home: House Home Access: Level entry     Home Layout: Multi-level Home Equipment: Walker - 4 wheels;Bedside commode;Walker - 2 wheels;Cane - quad;Shower seat;Wheelchair - manual;Grab bars - tub/shower;Grab bars - toilet Additional Comments: Daughter gets home from work ~930 am and leaves again around 1 pm    Prior Function Level of Independence: Independent with assistive device(s)         Comments: Uses rollator for ambulation. Does her own ADLs. No recent  falls reported. Makes her own breakfast/lunch and does dishes, cleans her bathroom etc.     Hand Dominance   Dominant Hand: Right    Extremity/Trunk Assessment   Upper Extremity Assessment Upper Extremity Assessment: Defer to OT evaluation    Lower Extremity  Assessment Lower Extremity Assessment: Generalized weakness (but functional)       Communication   Communication: HOH  Cognition Arousal/Alertness: Awake/alert Behavior During Therapy: WFL for tasks assessed/performed Overall Cognitive Status: Within Functional Limits for tasks assessed                                        General Comments General comments (skin integrity, edema, etc.): Incontinent of urine during ambulation/mobility. Bring brief next session.    Exercises     Assessment/Plan    PT Assessment Patient needs continued PT services  PT Problem List Decreased strength;Decreased mobility;Decreased balance;Cardiopulmonary status limiting activity       PT Treatment Interventions Therapeutic exercise;Gait training;Patient/family education;Therapeutic activities;Functional mobility training;Stair training;Balance training    PT Goals (Current goals can be found in the Care Plan section)  Acute Rehab PT Goals Patient Stated Goal: to get better and go home PT Goal Formulation: With patient Time For Goal Achievement: 12/31/20 Potential to Achieve Goals: Good    Frequency Min 3X/week   Barriers to discharge Inaccessible home environment stairs    Co-evaluation               AM-PAC PT "6 Clicks" Mobility  Outcome Measure Help needed turning from your back to your side while in a flat bed without using bedrails?: A Little Help needed moving from lying on your back to sitting on the side of a flat bed without using bedrails?: A Little Help needed moving to and from a bed to a chair (including a wheelchair)?: A Little Help needed standing up from a chair using your arms (e.g., wheelchair or bedside chair)?: A Little Help needed to walk in hospital room?: A Little Help needed climbing 3-5 steps with a railing? : A Little 6 Click Score: 18    End of Session Equipment Utilized During Treatment: Gait belt Activity Tolerance: Patient tolerated  treatment well Patient left: in chair;with call bell/phone within reach;with chair alarm set Nurse Communication: Mobility status PT Visit Diagnosis: Muscle weakness (generalized) (M62.81);Other abnormalities of gait and mobility (R26.89)    Time: 2637-8588 PT Time Calculation (min) (ACUTE ONLY): 28 min   Charges:   PT Evaluation $PT Eval Moderate Complexity: 1 Mod PT Treatments $Gait Training: 8-22 mins        Marisa Severin, PT, DPT Acute Rehabilitation Services Pager [phone] Office [place] 12/17/2020, 10:44 AM

## 2020-12-17 NOTE — Significant Event (Signed)
Pt is vomiting PRN given

## 2020-12-17 NOTE — Progress Notes (Signed)
Since last Zofran given patient has vomited 2 times, No significant BM today and per Daughter patient uses the bathroom after breakfast every morning and has been taking imodium nightly to counteract the loose stool. Patient and daughter asked not to give  imodium tonight due to not producing a stool today and the persistent vomiting  Paged MD to make aware.

## 2020-12-17 NOTE — Procedures (Signed)
PROCEDURE SUMMARY:  Successful US guided diagnostic and therapeutic right thoracentesis. Yielded 800 mL of cloudy, yellow fluid. Pt tolerated procedure fairly well, however procedure was stopped when she complained of feeling faint.  Patient repositioned in bed.  VSS.  BP 94/54.  No immediate complications.  Specimen was sent for labs. CXR ordered.  EBL < 5 mL  Docia Barrier PA-C 12/17/2020 11:13 AM

## 2020-12-18 DIAGNOSIS — I5033 Acute on chronic diastolic (congestive) heart failure: Secondary | ICD-10-CM

## 2020-12-18 DIAGNOSIS — Z9889 Other specified postprocedural states: Secondary | ICD-10-CM

## 2020-12-18 LAB — PH, BODY FLUID: pH, Body Fluid: 7.7

## 2020-12-18 LAB — BASIC METABOLIC PANEL
Anion gap: 7 (ref 5–15)
BUN: 23 mg/dL (ref 8–23)
CO2: 29 mmol/L (ref 22–32)
Calcium: 8 mg/dL — ABNORMAL LOW (ref 8.9–10.3)
Chloride: 104 mmol/L (ref 98–111)
Creatinine, Ser: 1.43 mg/dL — ABNORMAL HIGH (ref 0.44–1.00)
GFR, Estimated: 36 mL/min — ABNORMAL LOW (ref >60)
Glucose, Bld: 81 mg/dL (ref 70–99)
Potassium: 3.3 mmol/L — ABNORMAL LOW (ref 3.5–5.1)
Sodium: 140 mmol/L (ref 135–145)

## 2020-12-18 LAB — MAGNESIUM: Magnesium: 1.8 mg/dL (ref 1.7–2.4)

## 2020-12-18 MED ORDER — POTASSIUM CHLORIDE CRYS ER 20 MEQ PO TBCR
40.0000 meq | EXTENDED_RELEASE_TABLET | ORAL | Status: AC
  Administered 2020-12-18 (×2): 40 meq via ORAL
  Filled 2020-12-18 (×2): qty 2

## 2020-12-18 MED ORDER — FUROSEMIDE 10 MG/ML IJ SOLN: 20.0000 mg | Freq: Once | INTRAMUSCULAR | Status: DC

## 2020-12-18 MED ORDER — FUROSEMIDE 10 MG/ML IJ SOLN: 20.0000 mg | Freq: Two times a day (BID) | INTRAMUSCULAR | Status: DC

## 2020-12-18 MED ORDER — FUROSEMIDE 10 MG/ML IJ SOLN: 40.0000 mg | Freq: Two times a day (BID) | INTRAMUSCULAR | Status: DC

## 2020-12-18 NOTE — Progress Notes (Signed)
  Mobility Specialist Criteria Algorithm Info.  Mobility Team: HOB elevated: Activity: Ambulated in hall; Dangled on edge of bed Range of motion: Active; All extremities Level of assistance: Contact guard assist, steadying assist Assistive device: Front wheel walker Minutes sitting in chair:  Minutes stood: 5 minutes Minutes ambulated: 5 minutes Distance ambulated (ft): 220 ft (100 + 100 + 20) Mobility response: Tolerated well Bed Position: Semi-fowlers  Patient eager to participate in mobility this morning. Pt stood from EOB independently requiring verbal cues for hand placement. Ambulated in hallway 220 feet at min g with steady gait using RW. Needed one standing rest break due to dyspnea. Oxygen saturating well on room air, was 95% throughout. Tolerated ambulation well without complaint or incident and is now dangling EOB with all needs met.   12/18/2020 11:34 AM

## 2020-12-18 NOTE — Progress Notes (Signed)
Remote pacemaker transmission.   

## 2020-12-18 NOTE — Progress Notes (Addendum)
Progress Note  Patient Name: [name] Date of Encounter: 12/18/2020  Primary Cardiologist: [name], MD   Subjective   Has notable vomiting after thoracentesis.  800 cc volume removed.  Patient feels like her breathing is back to normal  Inpatient Medications    Scheduled Meds: . alum & mag hydroxide-simeth  15 mL Oral QPM  . vitamin C  1,000 mg Oral Daily  . cholecalciferol  5,000 Units Oral TID  . clopidogrel  75 mg Oral Q breakfast  . enoxaparin (LOVENOX) injection  30 mg Subcutaneous Q24H  . ferrous sulfate  325 mg Oral Q1500  . furosemide  20 mg Intravenous BID  . gabapentin  300 mg Oral BID  . levothyroxine  50 mcg Oral QAC breakfast  . loperamide  2 mg Oral QHS  . loratadine  10 mg Oral QHS  . losartan  25 mg Oral BID  . metoprolol succinate  50 mg Oral Daily  . multivitamin with minerals  1 tablet Oral Daily  . pantoprazole  40 mg Oral BID  . potassium chloride  40 mEq Oral Q4H  . pravastatin  20 mg Oral Daily  . sodium chloride flush  3 mL Intravenous Q12H  . sodium chloride flush  3 mL Intravenous Q12H  . sucralfate  1 g Oral Q6H  . verapamil  120 mg Oral BID  . vitamin B-12  5,000 mcg Oral Daily   Continuous Infusions: . sodium chloride     PRN Meds: sodium chloride, acetaminophen, ALPRAZolam, erythromycin, hydrocortisone, ondansetron (ZOFRAN) IV, polyvinyl alcohol, simethicone, sodium chloride flush, tobramycin-dexamethasone, triamcinolone cream   Vital Signs    Vitals:   12/17/20 2038 12/17/20 2348 12/18/20 0624 12/18/20 0753  BP: (!) 114/45  (!) 130/48   Pulse: 66  71   Resp: 20 16 16    Temp: 98.5 F (36.9 C) 98.2 F (36.8 C) 98.2 F (36.8 C) 98.3 F (36.8 C)  TempSrc: Oral Oral Oral Oral  SpO2: 92% 96% 96%   Weight:      Height:        Intake/Output Summary (Last 24 hours) at 12/18/2020 0937 Last data filed at 12/18/2020 0835 Gross per 24 hour  Intake 240 ml  Output --  Net 240 ml   Filed Weights   12/16/20 1321 12/16/20  2003 12/17/20 0407  Weight: 42.5 kg 42.5 kg 39.6 kg    Telemetry    SR to occasional A pacing - Personally Reviewed  ECG    No new - Personally Reviewed  Physical Exam   GEN: No acute distress.   Neck: No JVD Cardiac: RRR, no murmurs, rubs, or gallops.  Respiratory: Slight r lower lung field crackles below the level of band-aid from thoracentesis GI: Soft, nontender, non-distended  MS: No edema; No deformity. Neuro:  Nonfocal  Psych: Normal affect   Labs    Chemistry Recent Labs  Lab 12/16/20 1311 12/17/20 0517 12/18/20 0404  NA 140 142 140  K 3.9 3.5 3.3*  CL 109 107 104  CO2 25 28 29   GLUCOSE 90 77 81  BUN 14 13 23   CREATININE 1.01* 1.07* 1.43*  CALCIUM 8.6* 8.4* 8.0*  PROT 6.5  --   --   ALBUMIN 2.8*  --   --   AST 21  --   --   ALT 9  --   --   ALKPHOS 86  --   --   BILITOT 1.1  --   --   [facility]  55* 51* 36*  ANIONGAP 6 7 7      Hematology Recent Labs  Lab 12/16/20 1311  WBC 6.4  RBC 3.46*  HGB 11.2*  HCT 35.5*  MCV 102.6*  MCH 32.4  MCHC 31.5  RDW 16.0*  PLT 250    Cardiac EnzymesNo results for input(s): TROPONINI in the last 168 hours. No results for input(s): TROPIPOC in the last 168 hours.   BNP Recent Labs  Lab 12/16/20 1311  BNP 814.1*     DDimer No results for input(s): DDIMER in the last 168 hours.   Radiology    DG Chest 1 View  Result Date: 12/17/2020 CLINICAL DATA:  Post thoracentesis EXAM: CHEST  1 VIEW COMPARISON:  12/16/2020 FINDINGS: Decreasing right pleural effusion with small to moderate right effusion remaining. No pneumothorax. Right pacer remains in place, unchanged. Prior TAVR. Mild cardiomegaly, aortic atherosclerosis. Bibasilar atelectasis. IMPRESSION: Decreasing right effusion following thoracentesis.  No pneumothorax. Small to moderate right pleural effusion.  Bibasilar atelectasis. Electronically Signed   By: Rolm Baptise M.D.   On: 12/17/2020 11:37   DG Chest Right Decubitus  Result Date:  12/16/2020 CLINICAL DATA:  History of right pleural effusion, shortness of breath EXAM: CHEST - RIGHT DECUBITUS COMPARISON:  12/16/2020 FINDINGS: Right-side-down decubitus view of the chest. Moderate layering right pleural effusion. Apparent small layering left pleural effusion. Airspace disease at the right base. Stable cardiomediastinal silhouette with aortic atherosclerosis. Right-sided pacing device and valve prosthesis. IMPRESSION: Moderate layering right pleural effusion. Probable small layering left pleural effusion. Electronically Signed   By: Donavan Foil M.D.   On: 12/16/2020 19:12   DG Chest Portable 1 View  Result Date: 12/16/2020 CLINICAL DATA:  Shortness of breath. EXAM: PORTABLE CHEST 1 VIEW COMPARISON:  Chest x-ray 10/30/2019 FINDINGS: The pacer wires are in good position without complicating features. Surgical changes from prior TAVR. The cardiac silhouette, mediastinal and hilar contours are within normal limits and stable. Stable tortuosity and advanced calcification of the thoracic aorta. Moderate-sized right pleural effusion with overlying atelectasis. There is also a small left pleural effusion. No pulmonary edema or worrisome pulmonary lesions. The bony structures are grossly normal. IMPRESSION: Moderate-sized right pleural effusion and small left pleural effusion. Overlying atelectasis without definite infiltrates or pulmonary edema. Electronically Signed   By: Marijo Sanes M.D.   On: 12/16/2020 15:00   IR THORACENTESIS ASP PLEURAL SPACE W/IMG GUIDE  Result Date: 12/17/2020 INDICATION: Patient with history of congestive heart failure, new right pleural effusion. Request for diagnostic and therapeutic right thoracentesis. EXAM: ULTRASOUND GUIDED DIAGNOSTIC AND THERAPEUTIC RIGHT THORACENTESIS MEDICATIONS: 10 mL 1% lidocaine COMPLICATIONS: None immediate. PROCEDURE: An ultrasound guided thoracentesis was thoroughly discussed with the patient and questions answered. The benefits, risks,  alternatives and complications were also discussed. The patient understands and wishes to proceed with the procedure. Written consent was obtained. Ultrasound was performed to localize and mark an adequate pocket of fluid in the right chest. The area was then prepped and draped in the normal sterile fashion. 1% Lidocaine was used for local anesthesia. Under ultrasound guidance a 6 Fr Safe-T-Centesis catheter was introduced. Thoracentesis was performed. The catheter was removed and a dressing applied. FINDINGS: A total of approximately 800 mL of cloudy, yellow fluid was removed. Samples were sent to the laboratory as requested by the clinical team. IMPRESSION: Successful ultrasound guided diagnostic and therapeutic right thoracentesis yielding 800 mL of pleural fluid. Read by: Brynda Greathouse PA-C Electronically Signed   By: Corrie Mckusick D.O.   On:  12/17/2020 11:18    Cardiac Studies    Transthoracic Echocardiogram: Date: 10/29/20 Results: 1. Left ventricular ejection fraction, by estimation, is 60 to 65%. The  left ventricle has normal function. The left ventricle has no regional  wall motion abnormalities. There is mild concentric hypertrophy and  mild-to-moderate hypertrophy of the basal  septal segment. Left ventricular diastolic parameters are consistent with  Grade II diastolic dysfunction (pseudonormalization). Elevated left atrial  pressure.  2. Right ventricular systolic function is mildly reduced. The right  ventricular size is mildly enlarged. There is normal pulmonary artery  systolic pressure.  3. Left atrial size was severely dilated.  4. Right atrial size was severely dilated.  5. The mitral valve is degenerative. Trivial mitral valve regurgitation.  Moderate mitral annular calcification.  6. The aortic valve has been repaired/replaced. A 24mm Edwards Sapien  TAVR valve is present. The valve appears well seated with normal function  by doppler interrogation. Mean gradient  69mmHg, peak gradient 73mmHg, Vmax  2.4cm/s, DI 0.47. There is  mild-to-moderate paravalvular leak most notable in the 5 o'clock position  on aortic short axis view.  7. There is mild to moderate dilatation of the ascending aorta, measuring  39 mm.  8. The inferior vena cava is normal in size with greater than 50%  respiratory variability, suggesting right atrial pressure of 3 mmHg.   Patient Profile     87 y.o. female history of Moderate non obstructive CAD, PAF, PAD with recent stenting, prior GI bleed and CKD stage IIIA who presented with weakness and R sided pleural effusion  Assessment & Plan    New Pleural Effusion Moderate Non-obstructive CAD (with small septal perforator being obstructive HFpEF PAF  Presently in SR on no AC in the setting of bleeding PAD with recent stenting and symptomatic improvement in leg heaviness CKD stage IIIa Prior melanotic GI bleed and IDA R Dc PPM MDT for Tachy-brady - plavix at discharge; at follow up could consider warfarin for post PAD stent therapy (see prior notes for more details) - will hold lasix for PM will check BNP AM of 12/19/20 - iron continued - on home ARB, CCB, and BB  Potential 12/19/20 sign off depending on diuresis  For questions or updates, please contact [place] Please consult [email or web address] for contact info under Cardiology/STEMI.      Signed, Werner Lean, MD  12/18/2020, 9:37 AM

## 2020-12-18 NOTE — Progress Notes (Addendum)
PROGRESS NOTE    CACI ORREN   YHC:[id number]  DOB: May 27, 1934  DOA: 12/16/2020 PCP: [name], MD   Brief Narrative:  [name] is an 87 year old female with paroxysmal A. fib, pacemaker, peripheral vascular disease status post left femoral stent on Plavix, chronic diastolic heart failure, GERD, anemia who presented to the hospital on 4/12. She came for shortness of breath and swelling of her legs.  She was diagnosed with acute congestive heart failure and admitted to the hospital.   Subjective: Shortness of breath is much better at rest but she is still a little short of breath when ambulating in the hall.  She had severe nausea immediately after the thoracentesis. She then vomited multiple times after lunch.    Assessment & Plan:   Principal Problem:   Acute diastolic CHF (congestive heart failure) - grade 2 d CHF on ECHO from 10/29/20 - hold Lasix as Cr up today from vomiting and IV Lasix yesterday- have asked cardiology and they agree to hold Lasix  Active Problems: AKI/ dehydration CKD 3b - see above - cr 1.01.>> 1.43 - f/u cr tomorrow  Hypokalemia - have replaced today- Mg checked and is normal at 1.8 - recheck tomorrow  Right sided pleural effusion - s/p thoracentesis 4/13 - 800 cc of cloudy fluid- I feel this is transudate and likely related to underlying CHF  - noted to have gram positive cocci in clusters on gram stain- most likely a contaminant as she does not appear to have an infection- d/w ID as well who agree  - CXR afterwards showed mild to mod effusion was left  - hold off on repeat thoracentesis for now- pulse ox was in mid 90s when ambulating today  Vomiting after thoracentesis  - resolved    Hypothyroidism - cont Synthroid    Atrial fibrillation (HCC) Chadsvasc 5 -   NOAC on hold as she is on Plavix    Tachycardia-bradycardia syndrome  - s/p pacer     S/P TAVR (transcatheter aortic valve replacement)    PAD (peripheral  artery disease)  S/p stenting  -cont Plavix   Time spent in minutes: 35 DVT prophylaxis: enoxaparin (LOVENOX) injection 30 mg Start: 12/16/20 1800 Code Status: DNR Family Communication: daughter at bedside Level of Care: Level of care: Telemetry Medical Disposition Plan:  Status is: Inpatient  Remains inpatient appropriate because:Inpatient level of care appropriate due to severity of illness   Dispo: The patient is from: Home              Anticipated d/c is to: Home              Patient currently is not medically stable to d/c.   Difficult to place patient No Consultants:   cardiology Procedures:   Right sided toracentesis Antimicrobials:  Anti-infectives (From admission, onward)   None      Objective: Vitals:   12/17/20 2038 12/17/20 2348 12/18/20 0624 12/18/20 0753  BP: (!) 114/45  (!) 130/48   Pulse: 66  71   Resp: 20 16 16    Temp: 98.5 F (36.9 C) 98.2 F (36.8 C) 98.2 F (36.8 C) 98.3 F (36.8 C)  TempSrc: Oral Oral Oral Oral  SpO2: 92% 96% 96%   Weight:      Height:        Intake/Output Summary (Last 24 hours) at 12/18/2020 1004 Last data filed at 12/18/2020 0835 Gross per 24 hour  Intake 240 ml  Output --  Net 240 ml  Filed Weights   12/16/20 1321 12/16/20 2003 12/17/20 0407  Weight: 42.5 kg 42.5 kg 39.6 kg    Examination: General exam: Appears comfortable  HEENT: PERRLA, oral mucosa moist, no sclera icterus or thrush Respiratory system: crackles in RLL- Respiratory effort normal. Cardiovascular system: S1 & S2 heard, regular rate and rhythm Gastrointestinal system: Abdomen soft, non-tender, nondistended. Normal bowel sounds   Central nervous system: Alert and oriented. No focal neurological deficits. Extremities: No cyanosis, clubbing or edema Skin: No rashes or ulcers Psychiatry:  Mood & affect appropriate.      Data Reviewed: I have personally reviewed following labs and imaging studies  CBC: Recent Labs  Lab 12/16/20 1311  WBC  6.4  NEUTROABS 4.3  HGB 11.2*  HCT 35.5*  MCV 102.6*  PLT 494   Basic Metabolic Panel: Recent Labs  Lab 12/16/20 1311 12/17/20 0517 12/18/20 0404  NA 140 142 140  K 3.9 3.5 3.3*  CL 109 107 104  CO2 25 28 29   GLUCOSE 90 77 81  BUN 14 13 23   CREATININE 1.01* 1.07* 1.43*  CALCIUM 8.6* 8.4* 8.0*  MG  --   --  1.8   GFR: Estimated Creatinine Clearance: 18.3 mL/min (A) (by C-G formula based on SCr of 1.43 mg/dL (H)). Liver Function Tests: Recent Labs  Lab 12/16/20 1311  AST 21  ALT 9  ALKPHOS 86  BILITOT 1.1  PROT 6.5  ALBUMIN 2.8*   No results for input(s): LIPASE, AMYLASE in the last 168 hours. No results for input(s): AMMONIA in the last 168 hours. Coagulation Profile: Recent Labs  Lab 12/16/20 1311  INR 1.1   Cardiac Enzymes: No results for input(s): CKTOTAL, CKMB, CKMBINDEX, TROPONINI in the last 168 hours. BNP (last 3 results) No results for input(s): PROBNP in the last 8760 hours. HbA1C: No results for input(s): HGBA1C in the last 72 hours. CBG: No results for input(s): GLUCAP in the last 168 hours. Lipid Profile: No results for input(s): CHOL, HDL, LDLCALC, TRIG, CHOLHDL, LDLDIRECT in the last 72 hours. Thyroid Function Tests: No results for input(s): TSH, T4TOTAL, FREET4, T3FREE, THYROIDAB in the last 72 hours. Anemia Panel: Recent Labs    12/17/20 0517  TIBC 266  IRON 49   Urine analysis:    Component Value Date/Time   COLORURINE YELLOW 04/07/2020 1101   APPEARANCEUR CLEAR 04/07/2020 1101   LABSPEC 1.009 04/07/2020 1101   PHURINE < OR = 5.0 04/07/2020 1101   GLUCOSEU NEGATIVE 04/07/2020 1101   HGBUR NEGATIVE 04/07/2020 1101   BILIRUBINUR NEGATIVE 10/26/2019 1245   KETONESUR NEGATIVE 04/07/2020 1101   PROTEINUR NEGATIVE 04/07/2020 1101   UROBILINOGEN 0.2 07/22/2014 1120   NITRITE NEGATIVE 04/07/2020 1101   LEUKOCYTESUR NEGATIVE 04/07/2020 1101   Sepsis Labs: @LABRCNTIP (procalcitonin:4,lacticidven:4) ) Recent Results (from the past  240 hour(s))  Resp Panel by RT-PCR (Flu A&B, Covid) Nasopharyngeal Swab     Status: None   Collection Time: 12/16/20  2:01 PM   Specimen: Nasopharyngeal Swab; Nasopharyngeal(NP) swabs in vial transport medium  Result Value Ref Range Status   SARS Coronavirus 2 by RT PCR NEGATIVE NEGATIVE Final    Comment: (NOTE) SARS-CoV-2 target nucleic acids are NOT DETECTED.  The SARS-CoV-2 RNA is generally detectable in upper respiratory specimens during the acute phase of infection. The lowest concentration of SARS-CoV-2 viral copies this assay can detect is 138 copies/mL. A negative result does not preclude SARS-Cov-2 infection and should not be used as the sole basis for treatment or other patient management decisions.  A negative result may occur with  improper specimen collection/handling, submission of specimen other than nasopharyngeal swab, presence of viral mutation(s) within the areas targeted by this assay, and inadequate number of viral copies(<138 copies/mL). A negative result must be combined with clinical observations, patient history, and epidemiological information. The expected result is Negative.  Fact Sheet for Patients:  EntrepreneurPulse.com.au  Fact Sheet for Healthcare Providers:  IncredibleEmployment.be  This test is no t yet approved or cleared by the [place] FDA and  has been authorized for detection and/or diagnosis of SARS-CoV-2 by FDA under an Emergency Use Authorization (EUA). This EUA will remain  in effect (meaning this test can be used) for the duration of the COVID-19 declaration under Section 564(b)(1) of the Act, 21 U.S.C.section 360bbb-3(b)(1), unless the authorization is terminated  or revoked sooner.       Influenza A by PCR NEGATIVE NEGATIVE Final   Influenza B by PCR NEGATIVE NEGATIVE Final    Comment: (NOTE) The Xpert Xpress SARS-CoV-2/FLU/RSV plus assay is intended as an aid in the diagnosis of influenza  from Nasopharyngeal swab specimens and should not be used as a sole basis for treatment. Nasal washings and aspirates are unacceptable for Xpert Xpress SARS-CoV-2/FLU/RSV testing.  Fact Sheet for Patients: EntrepreneurPulse.com.au  Fact Sheet for Healthcare Providers: IncredibleEmployment.be  This test is not yet approved or cleared by the [place] FDA and has been authorized for detection and/or diagnosis of SARS-CoV-2 by FDA under an Emergency Use Authorization (EUA). This EUA will remain in effect (meaning this test can be used) for the duration of the COVID-19 declaration under Section 564(b)(1) of the Act, 21 U.S.C. section 360bbb-3(b)(1), unless the authorization is terminated or revoked.  Performed at [facility], [place] [address] [place], [place] [address]   Gram stain     Status: None   Collection Time: 12/17/20 11:20 AM   Specimen: Lung, Right; Pleural Fluid  Result Value Ref Range Status   Specimen Description PLEURAL FLUID LUNG RIGHT  Final   Special Requests PLEURAL FLUID RIGHT LUNG  Final   Gram Stain   Final    WBC PRESENT,BOTH PMN AND MONONUCLEAR NO ORGANISMS SEEN CYTOSPIN SMEAR Performed at [facility], [address], [place], [place] [address]    Report Status 12/17/2020 FINAL  Final  Culture, body fluid w Gram Stain-bottle     Status: None (Preliminary result)   Collection Time: 12/17/20 11:20 AM   Specimen: Pleura  Result Value Ref Range Status   Specimen Description PLEURAL FLUID LUNG RIGHT  Final   Special Requests NONE  Final   Culture   Final    NO GROWTH < 24 HOURS Performed at [facility], [place] [address] [place], [place] [address]    Report Status PENDING  Incomplete         Radiology Studies: DG Chest 1 View  Result Date: 12/17/2020 CLINICAL DATA:  Post thoracentesis EXAM: CHEST  1 VIEW COMPARISON:  12/16/2020 FINDINGS: Decreasing right pleural effusion with small to  moderate right effusion remaining. No pneumothorax. Right pacer remains in place, unchanged. Prior TAVR. Mild cardiomegaly, aortic atherosclerosis. Bibasilar atelectasis. IMPRESSION: Decreasing right effusion following thoracentesis.  No pneumothorax. Small to moderate right pleural effusion.  Bibasilar atelectasis. Electronically Signed   By: Rolm Baptise M.D.   On: 12/17/2020 11:37   DG Chest Right Decubitus  Result Date: 12/16/2020 CLINICAL DATA:  History of right pleural effusion, shortness of breath EXAM: CHEST - RIGHT DECUBITUS  COMPARISON:  12/16/2020 FINDINGS: Right-side-down decubitus view of the chest. Moderate layering right pleural effusion. Apparent small layering left pleural effusion. Airspace disease at the right base. Stable cardiomediastinal silhouette with aortic atherosclerosis. Right-sided pacing device and valve prosthesis. IMPRESSION: Moderate layering right pleural effusion. Probable small layering left pleural effusion. Electronically Signed   By: Donavan Foil M.D.   On: 12/16/2020 19:12   DG Chest Portable 1 View  Result Date: 12/16/2020 CLINICAL DATA:  Shortness of breath. EXAM: PORTABLE CHEST 1 VIEW COMPARISON:  Chest x-ray 10/30/2019 FINDINGS: The pacer wires are in good position without complicating features. Surgical changes from prior TAVR. The cardiac silhouette, mediastinal and hilar contours are within normal limits and stable. Stable tortuosity and advanced calcification of the thoracic aorta. Moderate-sized right pleural effusion with overlying atelectasis. There is also a small left pleural effusion. No pulmonary edema or worrisome pulmonary lesions. The bony structures are grossly normal. IMPRESSION: Moderate-sized right pleural effusion and small left pleural effusion. Overlying atelectasis without definite infiltrates or pulmonary edema. Electronically Signed   By: Marijo Sanes M.D.   On: 12/16/2020 15:00   IR THORACENTESIS ASP PLEURAL SPACE W/IMG GUIDE  Result  Date: 12/17/2020 INDICATION: Patient with history of congestive heart failure, new right pleural effusion. Request for diagnostic and therapeutic right thoracentesis. EXAM: ULTRASOUND GUIDED DIAGNOSTIC AND THERAPEUTIC RIGHT THORACENTESIS MEDICATIONS: 10 mL 1% lidocaine COMPLICATIONS: None immediate. PROCEDURE: An ultrasound guided thoracentesis was thoroughly discussed with the patient and questions answered. The benefits, risks, alternatives and complications were also discussed. The patient understands and wishes to proceed with the procedure. Written consent was obtained. Ultrasound was performed to localize and mark an adequate pocket of fluid in the right chest. The area was then prepped and draped in the normal sterile fashion. 1% Lidocaine was used for local anesthesia. Under ultrasound guidance a 6 Fr Safe-T-Centesis catheter was introduced. Thoracentesis was performed. The catheter was removed and a dressing applied. FINDINGS: A total of approximately 800 mL of cloudy, yellow fluid was removed. Samples were sent to the laboratory as requested by the clinical team. IMPRESSION: Successful ultrasound guided diagnostic and therapeutic right thoracentesis yielding 800 mL of pleural fluid. Read by: Brynda Greathouse PA-C Electronically Signed   By: Corrie Mckusick D.O.   On: 12/17/2020 11:18      Scheduled Meds: . alum & mag hydroxide-simeth  15 mL Oral QPM  . vitamin C  1,000 mg Oral Daily  . cholecalciferol  5,000 Units Oral TID  . clopidogrel  75 mg Oral Q breakfast  . enoxaparin (LOVENOX) injection  30 mg Subcutaneous Q24H  . ferrous sulfate  325 mg Oral Q1500  . gabapentin  300 mg Oral BID  . levothyroxine  50 mcg Oral QAC breakfast  . loperamide  2 mg Oral QHS  . loratadine  10 mg Oral QHS  . losartan  25 mg Oral BID  . metoprolol succinate  50 mg Oral Daily  . multivitamin with minerals  1 tablet Oral Daily  . pantoprazole  40 mg Oral BID  . potassium chloride  40 mEq Oral Q4H  .  pravastatin  20 mg Oral Daily  . sodium chloride flush  3 mL Intravenous Q12H  . sodium chloride flush  3 mL Intravenous Q12H  . sucralfate  1 g Oral Q6H  . verapamil  120 mg Oral BID  . vitamin B-12  5,000 mcg Oral Daily   Continuous Infusions: . sodium chloride       LOS: 2 days  Debbe Odea, MD [facility] Pager: [email or web address] 12/18/2020, 10:04 AM

## 2020-12-19 DIAGNOSIS — N1831 Chronic kidney disease, stage 3a: Secondary | ICD-10-CM

## 2020-12-19 DIAGNOSIS — I495 Sick sinus syndrome: Secondary | ICD-10-CM

## 2020-12-19 DIAGNOSIS — Z952 Presence of prosthetic heart valve: Secondary | ICD-10-CM

## 2020-12-19 LAB — BASIC METABOLIC PANEL
Anion gap: 4 — ABNORMAL LOW (ref 5–15)
BUN: 34 mg/dL — ABNORMAL HIGH (ref 8–23)
CO2: 25 mmol/L (ref 22–32)
Calcium: 8.2 mg/dL — ABNORMAL LOW (ref 8.9–10.3)
Chloride: 106 mmol/L (ref 98–111)
Creatinine, Ser: 1.49 mg/dL — ABNORMAL HIGH (ref 0.44–1.00)
GFR, Estimated: 34 mL/min — ABNORMAL LOW (ref >60)
Glucose, Bld: 84 mg/dL (ref 70–99)
Potassium: 5 mmol/L (ref 3.5–5.1)
Sodium: 135 mmol/L (ref 135–145)

## 2020-12-19 LAB — BRAIN NATRIURETIC PEPTIDE: B Natriuretic Peptide: 336.3 pg/mL — ABNORMAL HIGH (ref 0.0–100.0)

## 2020-12-19 MED ORDER — METOPROLOL SUCCINATE ER 50 MG PO TB24: 50.0000 mg | ORAL_TABLET | Freq: Every day | ORAL | 0 refills | Status: AC

## 2020-12-19 MED ORDER — LOSARTAN POTASSIUM 50 MG PO TABS: 25.0000 mg | ORAL_TABLET | Freq: Every day | ORAL | 0 refills | Status: AC

## 2020-12-19 MED ORDER — FUROSEMIDE 20 MG PO TABS
20.0000 mg | ORAL_TABLET | Freq: Every day | ORAL | Status: DC
  Administered 2020-12-19: 20 mg via ORAL
  Filled 2020-12-19: qty 1

## 2020-12-19 MED ORDER — METOPROLOL SUCCINATE ER 50 MG PO TB24
50.0000 mg | ORAL_TABLET | Freq: Two times a day (BID) | ORAL | 0 refills | Status: DC
Start: 2020-12-19 — End: 2020-12-19

## 2020-12-19 NOTE — Discharge Summary (Addendum)
Physician Discharge Summary  [name] FTD:[id number] DOB: 03/31/1934 DOA: 12/16/2020  PCP: [name], MD  Admit date: 12/16/2020 Discharge date: 12/19/2020  Admitted From: home Disposition:  home   Recommendations for Outpatient Follow-up:  1. F/u on fluid status and pleural effusions  Home Health:  none  Discharge Condition:  Stable    CODE STATUS:  Full code   Diet recommendation:  Heart healthy Consultations:  cardiology  Procedures/Studies: . 2d ECHO . Thoracentesis    Discharge Diagnoses:  Principal Problem:   Acute CHF (congestive heart failure) (HCC) Active Problems:   Hypothyroidism   Atrial fibrillation ([place]) Chadsvasc 5   Tachycardia-bradycardia syndrome (HCC)   CKD (chronic kidney disease), stage III (HCC)   S/P TAVR (transcatheter aortic valve replacement)   PAD (peripheral artery disease) ([place])     Brief Summary: [name] is an 87 year old female with paroxysmal A. fib, pacemaker, peripheral vascular disease status post left femoral stent on Plavix, chronic diastolic heart failure, GERD, anemia who presented to the hospital on 4/12. She came for shortness of breath and swelling of her legs.  She was diagnosed with acute congestive heart failure and admitted to the hospital.  Hospital Course:  Principal Problem:   Acute diastolic CHF (congestive heart failure) - grade 2 d CHF on ECHO from 10/29/20 - needs better BP control - discharge home on low dose lasix and Losartan, Metoprolol and Verapamil- see med list below - f/u with cardiology next week  Bilateral pleural effusion but large Right sided pleural effusion - s/p thoracentesis 4/13 - 800 cc of cloudy fluid- I feel this is transudate and likely related to underlying CHF  - noted to have gram positive cocci in clusters on gram stain- most likely a contaminant as she does not appear to have an infection- d/w ID as well who agree  - CXR afterwards showed mild to mod effusion was  left  - hold off on repeat thoracentesis for now- pulse ox was in mid 90s when ambulating  Active Problems: AKI/ dehydration CKD 3b - see above - cr 1.01.>> 1.43>> 1.49 - resume Lasix at 20 mg daily oral tomorrow  Hypokalemia - have replaced  - Mg checked and is normal at 1.8    Vomiting after thoracentesis  - resolved    Hypothyroidism - cont Synthroid    Atrial fibrillation (HCC) Chadsvasc 5 -   NOAC has been on hold as she is on Plavix for recent LE stent    Tachycardia-bradycardia syndrome  - s/p pacer     S/P TAVR (transcatheter aortic valve replacement)    PAD (peripheral artery disease)  S/p stenting  -cont Plavix   Discharge Exam: Vitals:   12/19/20 0800 12/19/20 1124  BP:  (!) 118/49  Pulse:  68  Resp: 16 16  Temp:  97.9 F (36.6 C)  SpO2:  98%   Vitals:   12/18/20 2048 12/19/20 0529 12/19/20 0800 12/19/20 1124  BP: (!) 125/53 (!) 134/50  (!) 118/49  Pulse: 68 68  68  Resp: _0 Temp: 98.6 F (37 C) 98.2 F (36.8 C)  97.9 F (36.6 C)  TempSrc: Oral Oral  Oral  SpO2: 99% 97%  98%  Weight:  41.1 kg    Height:        General: Pt is alert, awake, not in acute distress Cardiovascular: RRR, S1/S2 +, no rubs, no gallops Respiratory: CTA bilaterally, no wheezing, no rhonchi Abdominal: Soft, NT, ND, bowel sounds +  Extremities: no edema, no cyanosis   Discharge Instructions  Discharge Instructions    Diet - low sodium heart healthy   Complete by: As directed    Increase activity slowly   Complete by: As directed      Allergies as of 12/19/2020      Reactions   Adhesive [tape] Other (See Comments)   Tears skin, Please use "paper" tape   Amiodarone Other (See Comments)    Thyroid and liver and kidney problems   Hydrocodone Other (See Comments)   HALLLUCINATIONS   Other Other (See Comments)   NO BLOOD PRODUCTS -  Jehovah's Witness   Reglan [metoclopramide] Hives, Itching, Rash   Remeron [mirtazapine] Other (See Comments)    Hallucinations and makes the patient groggy    Codeine Nausea And Vomiting   Morphine And Related Nausea And Vomiting   Requip [ropinirole Hcl] Other (See Comments)   Headaches   Zinc Nausea Only      Medication List    TAKE these medications   acetaminophen 500 MG tablet Commonly known as: TYLENOL Take 500 mg by mouth every 8 (eight) hours as needed for moderate pain or mild pain (or headaches).   ALPRAZolam 0.25 MG tablet Commonly known as: XANAX Take 0.5-1 tablets (0.125-0.25 mg total) by mouth 2 (two) times daily as needed for anxiety.   amoxicillin 500 MG tablet Commonly known as: AMOXIL Take 4 tablets (2,000 mg total) by mouth as directed. 1 hour prior to dental work including cleanings What changed:   when to take this  additional instructions   clopidogrel 75 MG tablet Commonly known as: PLAVIX Take 1 tablet (75 mg total) by mouth daily with breakfast. What changed: Another medication with the same name was removed. Continue taking this medication, and follow the directions you see here.   Cyanocobalamin 5000 MCG Subl Place 5,000 mcg under the tongue daily.   erythromycin ophthalmic ointment Place 1 application into both eyes at bedtime as needed (for styes).   ferrous sulfate 325 (65 FE) MG tablet Take 325 mg by mouth daily with lunch.   furosemide 20 MG tablet Commonly known as: LASIX Take 1 tablet (20 mg total) by mouth daily. What changed: when to take this   gabapentin 300 MG capsule Commonly known as: NEURONTIN TAKE 1 CAPSULE BY MOUTH THREE TIMES A DAY What changed:   how much to take  how to take this  when to take this  additional instructions   hydrocortisone 2.5 % rectal cream Commonly known as: ANUSOL-HC Place rectally 2 (two) times daily. What changed:   how much to take  when to take this  reasons to take this   levothyroxine 100 MCG tablet Commonly known as: SYNTHROID TAKE 1/2 TABLET DAILY FOR THYROID. What changed:    how much to take  how to take this  when to take this  additional instructions   loperamide 2 MG tablet Commonly known as: IMODIUM A-D Take up to 12 tablets /day as needed for  for Diarrhea What changed:   how much to take  how to take this  when to take this  additional instructions   loratadine 10 MG tablet Commonly known as: CLARITIN Take 10 mg by mouth daily with supper.   losartan 50 MG tablet Commonly known as: Cozaar Take 0.5 tablets (25 mg total) by mouth daily. What changed: when to take this   metoprolol succinate 50 MG 24 hr tablet Commonly known as: TOPROL-XL Take 1 tablet (50  mg total) by mouth daily. What changed: when to take this   MYLANTA PO Take 20 mLs by mouth at bedtime.   nitroGLYCERIN 0.4 MG SL tablet Commonly known as: NITROSTAT Dissolve 1 tablet under tongue every 5 minutes if needed for Angina What changed:   how much to take  how to take this  when to take this  reasons to take this  additional instructions   ondansetron 8 MG disintegrating tablet Commonly known as: ZOFRAN-ODT Dissolve 1 tablet under tongue every 6 to 8 hours  3 x /day if needed for Nausea or Vomitting What changed:   how much to take  how to take this  when to take this  additional instructions   pantoprazole 40 MG tablet Commonly known as: PROTONIX Take 1 tablet (40 mg total) by mouth 2 (two) times daily. Further refills by PCP What changed:   when to take this  additional instructions   potassium chloride 10 MEQ tablet Commonly known as: KLOR-CON Take 10 mEq by mouth in the morning.   pravastatin 20 MG tablet Commonly known as: Pravachol Take 1 tablet (20 mg total) by mouth daily. What changed: when to take this   simethicone 80 MG chewable tablet Commonly known as: MYLICON Chew 80 mg by mouth every 6 (six) hours as needed for flatulence.   sucralfate 1 g tablet Commonly known as: CARAFATE Take 1 g by mouth in the morning, at  noon, and at bedtime.   Systane Complete 0.6 % Soln Generic drug: Propylene Glycol Place 1 drop into both eyes 3 (three) times daily as needed (irritation/dry eyes.).   tobramycin-dexamethasone ophthalmic solution Commonly known as: TOBRADEX Place 1 drop into both eyes daily as needed (for styes).   triamcinolone cream 0.1 % Commonly known as: KENALOG Apply 1 application topically 2 (two) times daily as needed (rash/skin irritation.).   verapamil 120 MG CR tablet Commonly known as: CALAN-SR TAKE 1 TABLET 2 X /DAY WITH A MEAL FOR BP & HEART RHYTHM What changed: See the new instructions.   vitamin C 1000 MG tablet Take 1,000 mg by mouth daily.   Vitamin D-3 125 MCG (5000 UT) Tabs Take 5,000 Units by mouth 3 (three) times daily.       Follow-up Information    [facility] Follow up.   Specialty: Home Health Services Why: HHPT Contact information: [place] [address] [phone]        Almyra Deforest, [place] On 01/13/2021.   Specialties: Cardiology, Radiology Why: Please arrive 15 min early to your 0915 AM hospital cardiology follow up appointment  Contact information: [address] [place] [address] [phone]        [name], MD.   Specialty: Internal Medicine Why: Please follow up in a week Contact information: [address] [place] [address] [phone]              Allergies  Allergen Reactions  . Adhesive [Tape] Other (See Comments)    Tears skin, Please use "paper" tape  . Amiodarone Other (See Comments)     Thyroid and liver and kidney problems  . Hydrocodone Other (See Comments)    HALLLUCINATIONS  . Other Other (See Comments)    NO BLOOD PRODUCTS -  Jehovah's Witness  . Reglan [Metoclopramide] Hives, Itching and Rash  . Remeron [Mirtazapine] Other (See Comments)    Hallucinations and makes the patient groggy   . Codeine Nausea And Vomiting  .  Morphine  And Related Nausea And Vomiting  . Requip [Ropinirole Hcl] Other (See Comments)    Headaches   . Zinc Nausea Only      DG Chest 1 View  Result Date: 12/17/2020 CLINICAL DATA:  Post thoracentesis EXAM: CHEST  1 VIEW COMPARISON:  12/16/2020 FINDINGS: Decreasing right pleural effusion with small to moderate right effusion remaining. No pneumothorax. Right pacer remains in place, unchanged. Prior TAVR. Mild cardiomegaly, aortic atherosclerosis. Bibasilar atelectasis. IMPRESSION: Decreasing right effusion following thoracentesis.  No pneumothorax. Small to moderate right pleural effusion.  Bibasilar atelectasis. Electronically Signed   By: Rolm Baptise M.D.   On: 12/17/2020 11:37   DG Chest Right Decubitus  Result Date: 12/16/2020 CLINICAL DATA:  History of right pleural effusion, shortness of breath EXAM: CHEST - RIGHT DECUBITUS COMPARISON:  12/16/2020 FINDINGS: Right-side-down decubitus view of the chest. Moderate layering right pleural effusion. Apparent small layering left pleural effusion. Airspace disease at the right base. Stable cardiomediastinal silhouette with aortic atherosclerosis. Right-sided pacing device and valve prosthesis. IMPRESSION: Moderate layering right pleural effusion. Probable small layering left pleural effusion. Electronically Signed   By: Donavan Foil M.D.   On: 12/16/2020 19:12   PERIPHERAL VASCULAR CATHETERIZATION  Result Date: 11/27/2020  [id number] LOCATION:  FACILITY: [facility] PHYSICIAN: Quay Burow, M.D. September 05, 1934 DATE OF PROCEDURE:  11/27/2020 DATE OF DISCHARGE: PV Angiogram/Intervention History obtained from chart review.VIRGILIA QUIGG is a 87 y.o. 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  She was referred by Dr. [name], her primary cardiologist for peripheral vascular evaluation because of right lower extremity  lifestyle limiting claudication.  She did have a TAVR procedure via the left carotid approach 10/30/2019.  Otherwise she has a history of tachybradycardia syndrome status post permanent transvenous pacemaker insertion, hypertension, hyperlipidemia and a solitary kidney with CKD stage III.  She is a Restaurant manager, fast food.  She had a CTA performed in her lower extremities 10/16/2019 revealing diffuse calcified aortoiliac disease with significant right extra iliac artery stenosis which is confirmed by her Doppler studies performed 10/29/2020 revealing a right ABI of 0.77 with a right external iliac artery stenosis.  She does have lifestyle limiting right lower extremity claudication.  She presents today for outpatient peripheral angiography potential endovascular therapy.  Her warfarin was held and she was bridged with Lovenox.  INR today is 1.1. Pre Procedure Diagnosis: Peripheral arterial disease Post Procedure Diagnosis: Peripheral arterial disease Operators: Dr. Quay Burow Procedures Performed:  1.  Ultrasound-guided left common femoral access  2.  Abdominal aortogram/bilateral iliac angiogram  3.  Contralateral access (secondary catheter placement)  4.  Orbital atherectomy right external iliac artery and common femoral artery  5.  PTA right extra iliac artery and common femoral artery  6.  Viabahn covered stenting right common femoral artery. PROCEDURE DESCRIPTION: The patient was brought to the second floor  Cardiac cath lab in the the postabsorptive state. She was premedicated with IV Versed and fentanyl. Her right and left groin were prepped and shaved in usual sterile fashion. Xylocaine 1% was used for local anesthesia.  I initially gained access using ultrasound guidance in the right common femoral artery.  The wire would not pass much beyond the needle tip in a angiogram through the needle revealed a subtotally occluded highly calcified distal right common femoral artery.  I then abandon the right side  and moved to  the left side.  I accessed the left common femoral artery under ultrasound guidance.  A digital image was captured and placed the patient's chart.  A 5 French sheath was inserted into the left common femoral artery using standard Seldinger technique.  A 5 French pigtail catheters placed the distal abdominal aorta.  Distal abdominal aortography and bilateral iliac angiography were performed.  On the pigtail was used for the entirety of the case.  Retroaortic pressures monitored during the case. Angiographic Data: 1: Abdominal aortogram-the renal arteries widely patent.  The infrarenal abdominal aorta was fluoroscopically heavily calcified. 2: Left lower extremity-the left common and extra iliac arteries widely patent 3: Right lower extremity-there was 70% segmental calcified right external iliac artery stenosis followed by 99% fairly focal calcified distal right common femoral artery stenosis.   Ms. Solum has severely calcified right external and common femoral artery stenosis.  We will proceed with orbital atherectomy followed by PTA plus or minus covered stenting. Procedure Description: Contralateral access was obtained with a crossover catheter, Glidewire, 035 Amplatz wire and a 7 [place] multipurpose 45 cm destination sheath.  Patient received a total of 90 units of heparin with an ACT of 327.  A total of 150 cc of contrast was administered to the patient. I crossed the subtotally occluded calcified distal right common femoral artery stenosis with an 018 6 gm Zilient  wire through an 018 quick cross endhole catheter.  I then exchanged for an 014/017 Viper wire.  I initially used a 1.25 micro bur to create a channel through the subtotally occluded highly calcified distal right common femoral artery of 220,000 RPMs.  I then switched to a 2 mm solid And perform multiple passes at 120,000 RPMs.  I did administer intra-arterial nitroglycerin.  I then exchanged back for the Zilient  wire and performed  PTA with a 5 mm x 4 cm [place] 018 over-the-wire balloon throughout the entire treated segment.  The common femoral lesion did appear somewhat "shaggy" I elected to cover this with a 6 mm x 25 mm long Viabahn covered stent postdilated with a 5 mm x 2 cm balloon resulting reduction of a 99% calcified distal right common femoral artery stenosis to 0% residual 70% calcified right external iliac artery stenosis less than 20% residual.  The patient tolerated the procedure well.  The destination sheath was then exchanged over the [facility] wire for a short 7 [place] sheath which was secured in place.  The patient left lab in stable condition.  She did receive 300 mg of p.o. Plavix at the end of the case. Final Impression: Successful orbital atherectomy followed by PTA and covered stenting of a subtotally occluded highly calcified distal right common femoral artery stenosis as well as a highly calcified segmental moderately stenosed right external iliac artery stenosis in the setting of lifestyle limiting claudication.  The patient will be reheparinized 8 hours after sheath removal without a bolus.  She can be treated with Plavix and warfarin alone.  She had Lovenox bridging prior to the procedure and will be reasonable to perform Lovenox bridging at discharge until she is therapeutic.  We will get lower extremity arterial Doppler studies notify office next week and I will see her back the week after for follow-up. Quay Burow. MD, [facility] 11/27/2020 10:10 AM   DG Chest Portable 1 View  Result Date: 12/16/2020 CLINICAL DATA:  Shortness of breath. EXAM: PORTABLE CHEST 1 VIEW COMPARISON:  Chest x-ray 10/30/2019 FINDINGS: The pacer wires are in good position  without complicating features. Surgical changes from prior TAVR. The cardiac silhouette, mediastinal and hilar contours are within normal limits and stable. Stable tortuosity and advanced calcification of the thoracic aorta. Moderate-sized right pleural effusion with  overlying atelectasis. There is also a small left pleural effusion. No pulmonary edema or worrisome pulmonary lesions. The bony structures are grossly normal. IMPRESSION: Moderate-sized right pleural effusion and small left pleural effusion. Overlying atelectasis without definite infiltrates or pulmonary edema. Electronically Signed   By: Marijo Sanes M.D.   On: 12/16/2020 15:00   VAS [place] ABI WITH/WO TBI  Result Date: 12/04/2020 LOWER EXTREMITY DOPPLER STUDY Indications: Peripheral artery disease. High Risk Factors: Hypertension, hyperlipidemia, no history of smoking, coronary                    artery disease. Other Factors: Patient denies any claudication symptoms and no rest pain.  Vascular Interventions: 11/27/2020-PTA of right external iliac artery and common                         femoral artery. Stent in the right common femoral                         artery. Comparison Study: In 10/2020, an arterial Doppler showed a right ABI of 0.77 and                   left 1.15 Performing Technologist: Wilkie Aye RVT  Examination Guidelines: A complete evaluation includes at minimum, Doppler waveform signals and systolic blood pressure reading at the level of bilateral brachial, anterior tibial, and posterior tibial arteries, when vessel segments are accessible. Bilateral testing is considered an integral part of a complete examination. Photoelectric Plethysmograph (PPG) waveforms and toe systolic pressure readings are included as required and additional duplex testing as needed. Limited examinations for reoccurring indications may be performed as noted.  ABI Findings: +---------+------------------+-----+--------+--------+ Right    Rt Pressure (mmHg)IndexWaveformComment  +---------+------------------+-----+--------+--------+ Brachial 182                                     +---------+------------------+-----+--------+--------+ ATA      193               1.06 biphasic          +---------+------------------+-----+--------+--------+ PTA      219               1.20 biphasic         +---------+------------------+-----+--------+--------+ PERO     190               1.04 biphasic         +---------+------------------+-----+--------+--------+ Great Toe123               0.68 Normal           +---------+------------------+-----+--------+--------+ +---------+------------------+-----+---------+-------+ Left     Lt Pressure (mmHg)IndexWaveform Comment +---------+------------------+-----+---------+-------+ Brachial 175                                     +---------+------------------+-----+---------+-------+ ATA      185               1.02 biphasic         +---------+------------------+-----+---------+-------+ PTA      200  1.10 triphasic        +---------+------------------+-----+---------+-------+ PERO     188               1.03 biphasic         +---------+------------------+-----+---------+-------+ Great Toe97                0.53 Normal           +---------+------------------+-----+---------+-------+ +-------+-----------+-----------+------------+------------+ ABI/TBIToday's ABIToday's TBIPrevious ABIPrevious TBI +-------+-----------+-----------+------------+------------+ Right  1.20       0.68       0.77        0.62         +-------+-----------+-----------+------------+------------+ Left   1.10       0.53       1.15        0.92         +-------+-----------+-----------+------------+------------+  Right ABIs appear increased compared to prior study on 10/2020. Left ABIs appear essentially unchanged compared to prior study on 10/2020.  Summary: Right: Resting right ankle-brachial index is within normal range. No evidence of significant right lower extremity arterial disease. The right toe-brachial index is abnormal. Left: Resting left ankle-brachial index is within normal range. No evidence of significant left lower  extremity arterial disease. The left toe-brachial index is abnormal.  *See table(s) above for measurements and observations.  Suggest follow up study in 6 months. Electronically signed by Jenkins Rouge MD on 12/04/2020 at 3:23:44 PM.    Final    CUP PACEART REMOTE DEVICE CHECK  Result Date: 12/09/2020 Scheduled remote reviewed. 15 AHR events appears a fib longest duration > 96hours; most recent episode 11/28/20 - duration 30:04 minutes.  burden 52.7%.  Known hx of persistent a fib.  Captured rhythm AS/VS 60's.   Histogram controlled.  Meds: Toprol XL, Verapamil.  Per epic telephone notes pt stopped [place].  Normal device function.  Will forward to triage for further review.  R. Powers, CVRS Next remote 91 days.  VAS [place] LOWER EXTREMITY ARTERIAL DUPLEX  Result Date: 12/04/2020 LOWER EXTREMITY ARTERIAL DUPLEX STUDY Indications: Peripheral artery disease. High Risk Factors: Hypertension, hyperlipidemia, no history of smoking, coronary                    artery disease. Other Factors: Patient denies any claudication symptoms and no rest pain.  Vascular Interventions: 11/27/2020-PTA of right external iliac artery and common                         femoral artery. Stent in the right common femoral                         artery. Current ABI:            Right 1.20; left 1.10 Comparison Study: In 10/2020, an arterial duplex showed a velocity of 336 cm/s in                   the right external iliac artery. Performing Technologist: Wilkie Aye RVT  Examination Guidelines: A complete evaluation includes B-mode imaging, spectral Doppler, color Doppler, and power Doppler as needed of all accessible portions of each vessel. Bilateral testing is considered an integral part of a complete examination. Limited examinations for reoccurring indications may be performed as noted.  +----------+--------+-----+--------+--------+--------+ RIGHT     PSV cm/sRatioStenosisWaveformComments  +----------+--------+-----+--------+--------+--------+ CFA Prox  92  biphasic         +----------+--------+-----+--------+--------+--------+ CFA Distal40                   biphasic         +----------+--------+-----+--------+--------+--------+ DFA       43                   biphasic         +----------+--------+-----+--------+--------+--------+ SFA Prox  49                   biphasic         +----------+--------+-----+--------+--------+--------+ SFA Mid   50                   biphasic         +----------+--------+-----+--------+--------+--------+ SFA Distal54                   biphasic         +----------+--------+-----+--------+--------+--------+ POP Prox  62                   biphasic         +----------+--------+-----+--------+--------+--------+ POP Distal50                   biphasic         +----------+--------+-----+--------+--------+--------+ TP Trunk  56                   biphasic         +----------+--------+-----+--------+--------+--------+  Summary: Right: Marked improvement is noted compared to previous study. Atherosclerosis in the common femoral, femoral, and popliteal and tibial arteries. Unable to visualize stent struts in the common femoral artery. The common femoral artery is patent with no focal stenosis.  See table(s) above for measurements and observations. Suggest follow up study in 6 months. Electronically signed by Jenkins Rouge MD on 12/04/2020 at 34:48:42 PM.    Final    VAS US AORTA/IVC/ILIACS  Result Date: 12/04/2020 ABDOMINAL AORTA STUDY Risk Factors: Hypertension, hyperlipidemia, no history of smoking, coronary               artery disease. Other Factors: Patietn denies any claudication symptoms. Vascular Interventions: 11/27/2020-PTA of right external iliac artery and common                         femoral artery. Stent in the right common femoral                         artery. Limitations: Air/bowel gas.   Comparison Study: In 10/2020, an arterial duplex showed a velocity of 336 cm/s in                   the right external iliac artery. Performing Technologist: Wilkie Aye RVT  Examination Guidelines: A complete evaluation includes B-mode imaging, spectral Doppler, color Doppler, and power Doppler as needed of all accessible portions of each vessel. Bilateral testing is considered an integral part of a complete examination. Limited examinations for reoccurring indications may be performed as noted.  Abdominal Aorta Findings: +-------------+----------+--------+--------+--------+ Location     PSV (cm/s)WaveformThrombusComments +-------------+----------+--------+--------+--------+ Proximal     59                                 +-------------+----------+--------+--------+--------+ Mid  55                                 +-------------+----------+--------+--------+--------+ Distal       62                                 +-------------+----------+--------+--------+--------+ RT CIA Prox  81        biphasic                 +-------------+----------+--------+--------+--------+ RT CIA Mid   59        biphasic                 +-------------+----------+--------+--------+--------+ RT CIA Distal61        biphasic                 +-------------+----------+--------+--------+--------+ RT EIA Prox  148       biphasic                 +-------------+----------+--------+--------+--------+ RT EIA Mid   151       biphasic                 +-------------+----------+--------+--------+--------+ RT EIA Distal177       biphasic                 +-------------+----------+--------+--------+--------+ LT CIA Prox  58        biphasic                 +-------------+----------+--------+--------+--------+ LT CIA Mid   61        biphasic                 +-------------+----------+--------+--------+--------+ LT CIA Distal102       biphasic                  +-------------+----------+--------+--------+--------+ LT EIA Prox  87        biphasic                 +-------------+----------+--------+--------+--------+ LT EIA Mid   107       biphasic                 +-------------+----------+--------+--------+--------+ LT EIA Distal131       biphasic                 +-------------+----------+--------+--------+--------+ Visualization of the Right CIA Distal artery, Right EIA Proximal artery, Right EIA Mid artery, Right EIA Distal artery, Left CIA Distal artery, Left EIA Proximal artery, Left EIA Mid artery and Left EIA Distal artery was limited.  Summary: Stenosis: Calcified plaque throughout the aorta and iliac arteries. Patent right external iliac artery, s/p PTA. Compared to prior exam in 10/2020, there is improvement shown. IVC/Iliac: There is no evidence of thrombus involving the IVC.  *See table(s) above for measurements and observations. Suggest follow up study in 6 months.  Electronically signed by Jenkins Rouge MD on 12/04/2020 at 3:53:35 PM.    Final    IR THORACENTESIS ASP PLEURAL SPACE W/IMG GUIDE  Result Date: 12/17/2020 INDICATION: Patient with history of congestive heart failure, new right pleural effusion. Request for diagnostic and therapeutic right thoracentesis. EXAM: ULTRASOUND GUIDED DIAGNOSTIC AND THERAPEUTIC RIGHT THORACENTESIS MEDICATIONS: 10 mL 1% lidocaine COMPLICATIONS: None immediate. PROCEDURE: An ultrasound guided thoracentesis was thoroughly discussed with the patient and questions answered. The benefits, risks, alternatives  and complications were also discussed. The patient understands and wishes to proceed with the procedure. Written consent was obtained. Ultrasound was performed to localize and mark an adequate pocket of fluid in the right chest. The area was then prepped and draped in the normal sterile fashion. 1% Lidocaine was used for local anesthesia. Under ultrasound guidance a 6 Fr Safe-T-Centesis catheter was  introduced. Thoracentesis was performed. The catheter was removed and a dressing applied. FINDINGS: A total of approximately 800 mL of cloudy, yellow fluid was removed. Samples were sent to the laboratory as requested by the clinical team. IMPRESSION: Successful ultrasound guided diagnostic and therapeutic right thoracentesis yielding 800 mL of pleural fluid. Read by: Brynda Greathouse PA-C Electronically Signed   By: Corrie Mckusick D.O.   On: 12/17/2020 11:18     The results of significant diagnostics from this hospitalization (including imaging, microbiology, ancillary and laboratory) are listed below for reference.     Microbiology: Recent Results (from the past 240 hour(s))  Resp Panel by RT-PCR (Flu A&B, Covid) Nasopharyngeal Swab     Status: None   Collection Time: 12/16/20  2:01 PM   Specimen: Nasopharyngeal Swab; Nasopharyngeal(NP) swabs in vial transport medium  Result Value Ref Range Status   SARS Coronavirus 2 by RT PCR NEGATIVE NEGATIVE Final    Comment: (NOTE) SARS-CoV-2 target nucleic acids are NOT DETECTED.  The SARS-CoV-2 RNA is generally detectable in upper respiratory specimens during the acute phase of infection. The lowest concentration of SARS-CoV-2 viral copies this assay can detect is 138 copies/mL. A negative result does not preclude SARS-Cov-2 infection and should not be used as the sole basis for treatment or other patient management decisions. A negative result may occur with  improper specimen collection/handling, submission of specimen other than nasopharyngeal swab, presence of viral mutation(s) within the areas targeted by this assay, and inadequate number of viral copies(<138 copies/mL). A negative result must be combined with clinical observations, patient history, and epidemiological information. The expected result is Negative.  Fact Sheet for Patients:  EntrepreneurPulse.com.au  Fact Sheet for Healthcare Providers:   IncredibleEmployment.be  This test is no t yet approved or cleared by the [place] FDA and  has been authorized for detection and/or diagnosis of SARS-CoV-2 by FDA under an Emergency Use Authorization (EUA). This EUA will remain  in effect (meaning this test can be used) for the duration of the COVID-19 declaration under Section 564(b)(1) of the Act, 21 U.S.C.section 360bbb-3(b)(1), unless the authorization is terminated  or revoked sooner.       Influenza A by PCR NEGATIVE NEGATIVE Final   Influenza B by PCR NEGATIVE NEGATIVE Final    Comment: (NOTE) The Xpert Xpress SARS-CoV-2/FLU/RSV plus assay is intended as an aid in the diagnosis of influenza from Nasopharyngeal swab specimens and should not be used as a sole basis for treatment. Nasal washings and aspirates are unacceptable for Xpert Xpress SARS-CoV-2/FLU/RSV testing.  Fact Sheet for Patients: EntrepreneurPulse.com.au  Fact Sheet for Healthcare Providers: IncredibleEmployment.be  This test is not yet approved or cleared by the [place] FDA and has been authorized for detection and/or diagnosis of SARS-CoV-2 by FDA under an Emergency Use Authorization (EUA). This EUA will remain in effect (meaning this test can be used) for the duration of the COVID-19 declaration under Section 564(b)(1) of the Act, 21 U.S.C. section 360bbb-3(b)(1), unless the authorization is terminated or revoked.  Performed at [facility], [place] [address] [place], [place] [address]   Gram stain  Status: None   Collection Time: 12/17/20 11:20 AM   Specimen: Lung, Right; Pleural Fluid  Result Value Ref Range Status   Specimen Description PLEURAL FLUID LUNG RIGHT  Final   Special Requests PLEURAL FLUID RIGHT LUNG  Final   Gram Stain   Final    WBC PRESENT,BOTH PMN AND MONONUCLEAR NO ORGANISMS SEEN CYTOSPIN SMEAR Performed at [facility], [address],  [place], [place] [address]    Report Status 12/17/2020 FINAL  Final  Culture, body fluid w Gram Stain-bottle     Status: Abnormal (Preliminary result)   Collection Time: 12/17/20 11:20 AM   Specimen: Pleura  Result Value Ref Range Status   Specimen Description PLEURAL FLUID LUNG RIGHT  Final   Special Requests NONE  Final   Gram Stain   Final    GRAM POSITIVE COCCI IN CLUSTERS CRITICAL RESULT CALLED TO, READ BACK BY AND VERIFIED WITH: RN L.Edison Pace ON [id number] AT [id number] BY T.SAAD    Culture (A)  Final    STAPHYLOCOCCUS HOMINIS SUSCEPTIBILITIES TO FOLLOW Performed at [facility], [place] [address] [place], [place] [address]    Report Status PENDING  Incomplete     Labs: BNP (last 3 results) Recent Labs    12/16/20 1311 12/19/20 0319  BNP 814.1* 485.4*   Basic Metabolic Panel: Recent Labs  Lab 12/16/20 1311 12/17/20 0517 12/18/20 0404 12/19/20 0319  NA 140 142 140 135  K 3.9 3.5 3.3* 5.0  CL 109 107 104 106  CO2 _0 GLUCOSE 90 77 81 84  BUN _1 34*  CREATININE 1.01* 1.07* 1.43* 1.49*  CALCIUM 8.6* 8.4* 8.0* 8.2*  MG  --   --  1.8  --    Liver Function Tests: Recent Labs  Lab 12/16/20 1311  AST 21  ALT 9  ALKPHOS 86  BILITOT 1.1  PROT 6.5  ALBUMIN 2.8*   No results for input(s): LIPASE, AMYLASE in the last 168 hours. No results for input(s): AMMONIA in the last 168 hours. CBC: Recent Labs  Lab 12/16/20 1311  WBC 6.4  NEUTROABS 4.3  HGB 11.2*  HCT 35.5*  MCV 102.6*  PLT 250   Cardiac Enzymes: No results for input(s): CKTOTAL, CKMB, CKMBINDEX, TROPONINI in the last 168 hours. BNP: Invalid input(s): POCBNP CBG: No results for input(s): GLUCAP in the last 168 hours. D-Dimer No results for input(s): DDIMER in the last 72 hours. Hgb A1c No results for input(s): HGBA1C in the last 72 hours. Lipid Profile No results for input(s): CHOL, HDL, LDLCALC, TRIG, CHOLHDL, LDLDIRECT in the last 72 hours. Thyroid function studies No results for  input(s): TSH, T4TOTAL, T3FREE, THYROIDAB in the last 72 hours.  Invalid input(s): FREET3 Anemia work up Recent Labs    12/17/20 0517  TIBC 266  IRON 49   Urinalysis    Component Value Date/Time   COLORURINE YELLOW 04/07/2020 1101   APPEARANCEUR CLEAR 04/07/2020 1101   LABSPEC 1.009 04/07/2020 1101   PHURINE < OR = 5.0 04/07/2020 1101   GLUCOSEU NEGATIVE 04/07/2020 1101   HGBUR NEGATIVE 04/07/2020 1101   BILIRUBINUR NEGATIVE 10/26/2019 1245   KETONESUR NEGATIVE 04/07/2020 1101   PROTEINUR NEGATIVE 04/07/2020 1101   UROBILINOGEN 0.2 07/22/2014 1120   NITRITE NEGATIVE 04/07/2020 1101   LEUKOCYTESUR NEGATIVE 04/07/2020 1101   Sepsis Labs Invalid input(s): PROCALCITONIN,  WBC,  LACTICIDVEN Microbiology Recent Results (from the past 240 hour(s))  Resp Panel by RT-PCR (Flu A&B, Covid) Nasopharyngeal  Swab     Status: None   Collection Time: 12/16/20  2:01 PM   Specimen: Nasopharyngeal Swab; Nasopharyngeal(NP) swabs in vial transport medium  Result Value Ref Range Status   SARS Coronavirus 2 by RT PCR NEGATIVE NEGATIVE Final    Comment: (NOTE) SARS-CoV-2 target nucleic acids are NOT DETECTED.  The SARS-CoV-2 RNA is generally detectable in upper respiratory specimens during the acute phase of infection. The lowest concentration of SARS-CoV-2 viral copies this assay can detect is 138 copies/mL. A negative result does not preclude SARS-Cov-2 infection and should not be used as the sole basis for treatment or other patient management decisions. A negative result may occur with  improper specimen collection/handling, submission of specimen other than nasopharyngeal swab, presence of viral mutation(s) within the areas targeted by this assay, and inadequate number of viral copies(<138 copies/mL). A negative result must be combined with clinical observations, patient history, and epidemiological information. The expected result is Negative.  Fact Sheet for Patients:   EntrepreneurPulse.com.au  Fact Sheet for Healthcare Providers:  IncredibleEmployment.be  This test is no t yet approved or cleared by the [place] FDA and  has been authorized for detection and/or diagnosis of SARS-CoV-2 by FDA under an Emergency Use Authorization (EUA). This EUA will remain  in effect (meaning this test can be used) for the duration of the COVID-19 declaration under Section 564(b)(1) of the Act, 21 U.S.C.section 360bbb-3(b)(1), unless the authorization is terminated  or revoked sooner.       Influenza A by PCR NEGATIVE NEGATIVE Final   Influenza B by PCR NEGATIVE NEGATIVE Final    Comment: (NOTE) The Xpert Xpress SARS-CoV-2/FLU/RSV plus assay is intended as an aid in the diagnosis of influenza from Nasopharyngeal swab specimens and should not be used as a sole basis for treatment. Nasal washings and aspirates are unacceptable for Xpert Xpress SARS-CoV-2/FLU/RSV testing.  Fact Sheet for Patients: EntrepreneurPulse.com.au  Fact Sheet for Healthcare Providers: IncredibleEmployment.be  This test is not yet approved or cleared by the [place] FDA and has been authorized for detection and/or diagnosis of SARS-CoV-2 by FDA under an Emergency Use Authorization (EUA). This EUA will remain in effect (meaning this test can be used) for the duration of the COVID-19 declaration under Section 564(b)(1) of the Act, 21 U.S.C. section 360bbb-3(b)(1), unless the authorization is terminated or revoked.  Performed at [facility], [place] [address], [place], [place] [address]   Gram stain     Status: None   Collection Time: 12/17/20 11:20 AM   Specimen: Lung, Right; Pleural Fluid  Result Value Ref Range Status   Specimen Description PLEURAL FLUID LUNG RIGHT  Final   Special Requests PLEURAL FLUID RIGHT LUNG  Final   Gram Stain   Final    WBC PRESENT,BOTH PMN AND MONONUCLEAR NO ORGANISMS  SEEN CYTOSPIN SMEAR Performed at [facility], [address] [place], [place] [address]    Report Status 12/17/2020 FINAL  Final  Culture, body fluid w Gram Stain-bottle     Status: Abnormal (Preliminary result)   Collection Time: 12/17/20 11:20 AM   Specimen: Pleura  Result Value Ref Range Status   Specimen Description PLEURAL FLUID LUNG RIGHT  Final   Special Requests NONE  Final   Gram Stain   Final    GRAM POSITIVE COCCI IN CLUSTERS CRITICAL RESULT CALLED TO, READ BACK BY AND VERIFIED WITH: RN L.Edison Pace ON [id number] AT [id number] BY T.SAAD    Culture (A)  Final  STAPHYLOCOCCUS HOMINIS SUSCEPTIBILITIES TO FOLLOW Performed at [facility], [place] [address] [place], [place] [address]    Report Status PENDING  Incomplete     Time coordinating discharge in minutes: 65  SIGNED:   Debbe Odea, MD  [facility] 12/19/2020, 1:10 PM

## 2020-12-19 NOTE — Progress Notes (Signed)
Physical Therapy Treatment Patient Details Name: [name] MRN: [id number] DOB: 1934-03-21 Today's Date: 12/19/2020    History of Present Illness Patient is a 87 y/o female who presents on 12/17/20 with SOB and chest pressure. CXR- moderate right pleural effusion. Workup for acute on chronic CHF. S/p R thoracentesis 4/13. PMH includes CHF, CAD, CKD, hx of CVA, adenocarcinoma of stomach s/p partial gastrectomy 2017, HTN, s/p TAVR, PAF, endometrial ca.   PT Comments    Pt progressing well with mobility. Session focused on gait training with rollator and stair training; pt able to perform well with intermittent min guard for balance. Daughter present to observe session and supportive. Pt preparing for d/c home today. Pt and daughter report no further questions or concerns. If to remain admitted, will continue to follow acutely.  SpO2 99% on RA, HR 109 ambulating    Follow Up Recommendations  Home health PT;Supervision - Intermittent     Equipment Recommendations  None recommended by PT    Recommendations for Other Services       Precautions / Restrictions Precautions Precautions: Fall;Other (comment) Precaution Comments: Urine incontinence (wears Depends) Restrictions Weight Bearing Restrictions: No    Mobility  Bed Mobility Overal bed mobility: Modified Independent Bed Mobility: Supine to Sit                Transfers Overall transfer level: Needs assistance Equipment used: 4-wheeled walker Transfers: Sit to/from Stand Sit to Stand: Min guard         General transfer comment: Multiple sit<>stands from EOB and rollator seat with min guard; pt with good awareness to lock rollator brakes, cues to place rollator against wall before seated rest break in stairwell  Ambulation/Gait Ambulation/Gait assistance: Min guard Gait Distance (Feet): 200 Feet Assistive device: 4-wheeled walker Gait Pattern/deviations: Step-through pattern;Decreased stride length;Trunk  flexed Gait velocity: Decreased   General Gait Details: Slow, mostly steady gait with rollator and intermittent min guard for balance; seated rest break after ambulation before stair training, and again after stair training before walking back to room   Stairs Stairs: Yes Stairs assistance: Min guard Stair Management: Two rails;Step to pattern;Forwards Number of Stairs: 7 General stair comments: Ascend/descended 7 steps with bilateral rail support, min guard for balance; daughter present to observe   Wheelchair Mobility    Modified Rankin (Stroke Patients Only)       Balance Overall balance assessment: Needs assistance Sitting-balance support: Feet supported;No upper extremity supported Sitting balance-Leahy Scale: Good     Standing balance support: During functional activity Standing balance-Leahy Scale: Fair Standing balance comment: can static stand without UE support; static and dynamic balance improved with UE support                            Cognition Arousal/Alertness: Awake/alert Behavior During Therapy: WFL for tasks assessed/performed Overall Cognitive Status: Within Functional Limits for tasks assessed                                        Exercises      General Comments General comments (skin integrity, edema, etc.): Daughter present and supportive; provided brief for session. Pt's HR up to 109 with ambulation, SpO2 92% on RA. Educ pt and daughter on activity recommendations      Pertinent Vitals/Pain Pain Assessment: No/denies pain    Home Living  Prior Function            PT Goals (current goals can now be found in the care plan section) Progress towards PT goals: Progressing toward goals    Frequency    Min 3X/week      PT Plan Current plan remains appropriate    Co-evaluation              AM-PAC PT "6 Clicks" Mobility   Outcome Measure  Help needed turning from  your back to your side while in a flat bed without using bedrails?: None Help needed moving from lying on your back to sitting on the side of a flat bed without using bedrails?: A Little Help needed moving to and from a bed to a chair (including a wheelchair)?: A Little Help needed standing up from a chair using your arms (e.g., wheelchair or bedside chair)?: A Little Help needed to walk in hospital room?: A Little Help needed climbing 3-5 steps with a railing? : A Little 6 Click Score: 19    End of Session Equipment Utilized During Treatment: Gait belt Activity Tolerance: Patient tolerated treatment well Patient left: in bed;with call bell/phone within reach;with family/visitor present Nurse Communication: Mobility status PT Visit Diagnosis: Muscle weakness (generalized) (M62.81);Other abnormalities of gait and mobility (R26.89)     Time: 8338-2505 PT Time Calculation (min) (ACUTE ONLY): 17 min  Charges:  $Gait Training: 8-22 mins                     Mabeline Caras, PT, DPT Acute Rehabilitation Services  Pager [phone] Office [place] 12/19/2020, 9:58 AM

## 2020-12-19 NOTE — TOC Progression Note (Addendum)
Transition of Care [facility]) - Progression Note    Patient Details  Name: [name] MRN: [id number] Date of Birth: 22-Feb-1934  [facility]) CM/SW Contact  Zenon Mayo, RN Phone Number: 12/19/2020, 8:52 AM  Clinical Narrative:    NCM spoke with patient and daughter at the bedside.  NCM offered choice for HHPT.  Daughter states they have had [facility] before, NCM informed her they are now [place].   She states they would like to have [place].  NCM made referral to [facility].  Awaiting call back. Per Lanetta Inch they are not able to take referral.  NCM made referral to [facility] , she states she can take referral for HHPT. Soc will begin next week Monday or Tuesday.           Expected Discharge Plan and Services                                                 Social Determinants of Health (SDOH) Interventions    Readmission Risk Interventions No flowsheet data found.

## 2020-12-19 NOTE — TOC Transition Note (Signed)
Transition of Care [facility]) - CM/SW Discharge Note   Patient Details  Name: ARDITH TEST MRN: [id number] Date of Birth: Mar 11, 1934  Transition of Care [facility]) CM/SW Contact:  Zenon Mayo, RN Phone Number: 12/19/2020, 11:52 AM   Clinical Narrative:    Patient is for dc today, daughter states she is ok with [facility], NCM made referral to [facility] with [facility].  Soc will begin Tues or Wed next week.     Final next level of care: [place] Barriers to Discharge: No Barriers Identified   Patient Goals and CMS Choice Patient states their goals for this hospitalization and ongoing recovery are:: return home CMS Medicare.gov Compare Post Acute Care list provided to:: Patient Represenative (must comment) Choice offered to / list presented to : Adult Children  Discharge Placement                       Discharge Plan and Services                  DME Agency: NA       HH Arranged: PT HH Agency: [place] Date [facility] [place] Agency Contacted: 12/19/20 Time [place]: 1152 Representative spoke with at [place]: Cyndi  Social Determinants of Health ([place]) Interventions     Readmission Risk Interventions No flowsheet data found.

## 2020-12-19 NOTE — Consult Note (Signed)
   [facility] CM Inpatient Consult   12/19/2020  [name] 1934/03/20 [id number]  [facility] [employer] [ACO] Patient: [name]  Patient screened for hospitalization with noted less than 30 days readmission of unplanned hospitalization and to assess for potential [facility] [place] [facility] follow up needs for post hospital transition.  Review of patient's medical record reveals patient is for home with home health.  Met with patient and daughter at the bedside to explain [place] [facility] follow up services if needed.  Daughter states, 'she is well covered and has follow up with her primary care office every 3 months."  Patient denies any SDOH needs, states, "I have everything covered with my 2 daughters, they take very good care of me."  Plan:  No additional needs for follow up.  Patient's daughter was given a brochure and 24 hour nurse advise line magnet and encouraged to call for needs.  For questions contact:   Natividad Brood, RN BSN [facility] Liaison  [phone] business mobile phone Toll free office [phone]  Fax number: [phone] [email or web address]

## 2020-12-19 NOTE — Care Management Important Message (Signed)
Important Message  Patient Details  Name: [name] MRN: [id number] Date of Birth: Feb 05, 1934   Medicare Important Message Given:  Yes - Important Message mailed due to current National Emergency  Verbal consent obtained due to current National Emergency  Relationship to patient: Child Contact Name: [name] Call Date: 12/19/20  Time: 1038 Phone: [phone] Outcome: Spoke with contact Important Message mailed to: Patient address on file    Delorse Lek 12/19/2020, 10:38 AM

## 2020-12-19 NOTE — Progress Notes (Signed)
Progress Note  Patient Name: [name] Date of Encounter: 12/19/2020  Primary Cardiologist: [name], MD   Subjective   No further vomiting.  No issues with PT.  No O2 required.  Inpatient Medications    Scheduled Meds: . alum & mag hydroxide-simeth  15 mL Oral QPM  . vitamin C  1,000 mg Oral Daily  . cholecalciferol  5,000 Units Oral TID  . clopidogrel  75 mg Oral Q breakfast  . enoxaparin (LOVENOX) injection  30 mg Subcutaneous Q24H  . ferrous sulfate  325 mg Oral Q1500  . gabapentin  300 mg Oral BID  . levothyroxine  50 mcg Oral QAC breakfast  . loperamide  2 mg Oral QHS  . loratadine  10 mg Oral QHS  . losartan  25 mg Oral BID  . metoprolol succinate  50 mg Oral Daily  . multivitamin with minerals  1 tablet Oral Daily  . pantoprazole  40 mg Oral BID  . pravastatin  20 mg Oral Daily  . sodium chloride flush  3 mL Intravenous Q12H  . sodium chloride flush  3 mL Intravenous Q12H  . sucralfate  1 g Oral Q6H  . verapamil  120 mg Oral BID  . vitamin B-12  5,000 mcg Oral Daily   Continuous Infusions: . sodium chloride     PRN Meds: sodium chloride, acetaminophen, ALPRAZolam, erythromycin, hydrocortisone, ondansetron (ZOFRAN) IV, polyvinyl alcohol, simethicone, sodium chloride flush, tobramycin-dexamethasone, triamcinolone cream   Vital Signs    Vitals:   12/18/20 1400 12/18/20 1654 12/18/20 2048 12/19/20 0529  BP:  (!) 114/49 (!) 125/53 (!) 134/50  Pulse:  65 68 68  Resp:  16 16 18   Temp:  98.5 F (36.9 C) 98.6 F (37 C) 98.2 F (36.8 C)  TempSrc:  Oral Oral Oral  SpO2:  98% 99% 97%  Weight: 40.8 kg   41.1 kg  Height:        Intake/Output Summary (Last 24 hours) at 12/19/2020 1050 Last data filed at 12/19/2020 0905 Gross per 24 hour  Intake 1143 ml  Output 0 ml  Net 1143 ml   Filed Weights   12/17/20 0407 12/18/20 1400 12/19/20 0529  Weight: 39.6 kg 40.8 kg 41.1 kg    Telemetry    SR to occasional A pacing - Personally Reviewed  ECG     No new - Personally Reviewed  Physical Exam   GEN: No acute distress.   Neck: No JVD Cardiac: RRR, no murmurs, rubs, or gallops.  Respiratory: Slight r lower lung field crackles below the level of band-aid from thoracentesis that has persisted from 12/19/20. GI: Soft, nontender, non-distended  MS: No edema; No deformity. Neuro:  Nonfocal  Psych: Normal affect   Labs    Chemistry Recent Labs  Lab 12/16/20 1311 12/17/20 0517 12/18/20 0404 12/19/20 0319  NA 140 142 140 135  K 3.9 3.5 3.3* 5.0  CL 109 107 104 106  CO2 25 28 29 25   GLUCOSE 90 77 81 84  BUN 14 13 23  34*  CREATININE 1.01* 1.07* 1.43* 1.49*  CALCIUM 8.6* 8.4* 8.0* 8.2*  PROT 6.5  --   --   --   ALBUMIN 2.8*  --   --   --   AST 21  --   --   --   ALT 9  --   --   --   ALKPHOS 86  --   --   --   BILITOT 1.1  --   --   --  GFRNONAA 55* 51* 36* 34*  ANIONGAP 6 7 7  4*     Hematology Recent Labs  Lab 12/16/20 1311  WBC 6.4  RBC 3.46*  HGB 11.2*  HCT 35.5*  MCV 102.6*  MCH 32.4  MCHC 31.5  RDW 16.0*  PLT 250    Cardiac EnzymesNo results for input(s): TROPONINI in the last 168 hours. No results for input(s): TROPIPOC in the last 168 hours.   BNP Recent Labs  Lab 12/16/20 1311 12/19/20 0319  BNP 814.1* 336.3*     DDimer No results for input(s): DDIMER in the last 168 hours.   Radiology    DG Chest 1 View  Result Date: 12/17/2020 CLINICAL DATA:  Post thoracentesis EXAM: CHEST  1 VIEW COMPARISON:  12/16/2020 FINDINGS: Decreasing right pleural effusion with small to moderate right effusion remaining. No pneumothorax. Right pacer remains in place, unchanged. Prior TAVR. Mild cardiomegaly, aortic atherosclerosis. Bibasilar atelectasis. IMPRESSION: Decreasing right effusion following thoracentesis.  No pneumothorax. Small to moderate right pleural effusion.  Bibasilar atelectasis. Electronically Signed   By: Rolm Baptise M.D.   On: 12/17/2020 11:37   IR THORACENTESIS ASP PLEURAL SPACE W/IMG  GUIDE  Result Date: 12/17/2020 INDICATION: Patient with history of congestive heart failure, new right pleural effusion. Request for diagnostic and therapeutic right thoracentesis. EXAM: ULTRASOUND GUIDED DIAGNOSTIC AND THERAPEUTIC RIGHT THORACENTESIS MEDICATIONS: 10 mL 1% lidocaine COMPLICATIONS: None immediate. PROCEDURE: An ultrasound guided thoracentesis was thoroughly discussed with the patient and questions answered. The benefits, risks, alternatives and complications were also discussed. The patient understands and wishes to proceed with the procedure. Written consent was obtained. Ultrasound was performed to localize and mark an adequate pocket of fluid in the right chest. The area was then prepped and draped in the normal sterile fashion. 1% Lidocaine was used for local anesthesia. Under ultrasound guidance a 6 Fr Safe-T-Centesis catheter was introduced. Thoracentesis was performed. The catheter was removed and a dressing applied. FINDINGS: A total of approximately 800 mL of cloudy, yellow fluid was removed. Samples were sent to the laboratory as requested by the clinical team. IMPRESSION: Successful ultrasound guided diagnostic and therapeutic right thoracentesis yielding 800 mL of pleural fluid. Read by: Brynda Greathouse PA-C Electronically Signed   By: Corrie Mckusick D.O.   On: 12/17/2020 11:18    Cardiac Studies    Transthoracic Echocardiogram: Date: 10/29/20 Results: 1. Left ventricular ejection fraction, by estimation, is 60 to 65%. The  left ventricle has normal function. The left ventricle has no regional  wall motion abnormalities. There is mild concentric hypertrophy and  mild-to-moderate hypertrophy of the basal  septal segment. Left ventricular diastolic parameters are consistent with  Grade II diastolic dysfunction (pseudonormalization). Elevated left atrial  pressure.  2. Right ventricular systolic function is mildly reduced. The right  ventricular size is mildly enlarged.  There is normal pulmonary artery  systolic pressure.  3. Left atrial size was severely dilated.  4. Right atrial size was severely dilated.  5. The mitral valve is degenerative. Trivial mitral valve regurgitation.  Moderate mitral annular calcification.  6. The aortic valve has been repaired/replaced. A 62mm Edwards Sapien  TAVR valve is present. The valve appears well seated with normal function  by doppler interrogation. Mean gradient 14mmHg, peak gradient 55mmHg, Vmax  2.4cm/s, DI 0.47. There is  mild-to-moderate paravalvular leak most notable in the 5 o'clock position  on aortic short axis view.  7. There is mild to moderate dilatation of the ascending aorta, measuring  39 mm.  8.  The inferior vena cava is normal in size with greater than 50%  respiratory variability, suggesting right atrial pressure of 3 mmHg.   Patient Profile     87 y.o. female history of Moderate non obstructive CAD, PAF, PAD with recent stenting, prior GI bleed and CKD stage IIIA who presented with weakness and R sided pleural effusion  Assessment & Plan    New Pleural Effusion Moderate Non-obstructive CAD (with small septal perforator being obstructive HFpEF PAF  Presently in SR on no AC in the setting of bleeding PAD with recent stenting and symptomatic improvement in leg heaviness CKD stage IIIa Prior melanotic GI bleed and IDA R Dc PPM MDT for Tachy-brady - plavix at discharge; at follow up could consider warfarin for post PAD stent therapy (see prior notes for more details) - Consideration of lasix 20 mg Po Daily today based with close BMP and MG follow up (ordered) - iron continued - on home ARB (just daily planned), CCB, and BB - patient is peri-discharge working on outpatient follow up  Discussed with patient, daughter, and primary MD  For questions or updates, please contact [facility] Please consult [email or web address] for contact info under Cardiology/STEMI.      Signed, Werner Lean, MD  12/19/2020, 10:50 AM

## 2020-12-20 LAB — CULTURE, BODY FLUID W GRAM STAIN -BOTTLE

## 2020-12-22 ENCOUNTER — Telehealth: Payer: Self-pay | Admitting: *Deleted

## 2020-12-22 ENCOUNTER — Other Ambulatory Visit: Payer: Self-pay | Admitting: Internal Medicine

## 2020-12-22 ENCOUNTER — Other Ambulatory Visit: Payer: Self-pay | Admitting: Physician Assistant

## 2020-12-22 ENCOUNTER — Other Ambulatory Visit (HOSPITAL_COMMUNITY): Payer: Self-pay

## 2020-12-22 DIAGNOSIS — I495 Sick sinus syndrome: Secondary | ICD-10-CM | POA: Diagnosis not present

## 2020-12-22 DIAGNOSIS — I739 Peripheral vascular disease, unspecified: Secondary | ICD-10-CM | POA: Diagnosis not present

## 2020-12-22 DIAGNOSIS — I5033 Acute on chronic diastolic (congestive) heart failure: Secondary | ICD-10-CM | POA: Diagnosis not present

## 2020-12-22 DIAGNOSIS — N179 Acute kidney failure, unspecified: Secondary | ICD-10-CM | POA: Diagnosis not present

## 2020-12-22 DIAGNOSIS — N1832 Chronic kidney disease, stage 3b: Secondary | ICD-10-CM | POA: Diagnosis not present

## 2020-12-22 LAB — CYTOLOGY - NON PAP

## 2020-12-22 NOTE — Telephone Encounter (Signed)
Hospital follow up OV 12/23/2020 with Dr Melford Aase.

## 2020-12-22 NOTE — Telephone Encounter (Signed)
Called patient on 12/22/2020 , 11:30 AM in an attempt to reach the patient for a hospital follow up. Spoke with daughter, Wess Botts.  She said patient was still very weak.  Admit date: 12/16/20 Discharge: 12/19/20   She does not have any questions or concerns about medications from the hospital admission. The patient's medications were reviewed over the phone, they were counseled to bring in all current medications to the hospital follow up visit.   I advised the patient to call if any questions or concerns arise about the hospital admission or medications    Home health was started in the hospital. Alvis Lemmings will follow up with physical therapy.  Daughter expresses understanding of all medication changes.   All questions were answered and a follow up appointment was made.   Prior to Admission medications   Medication Sig Start Date End Date Taking? Authorizing Provider  acetaminophen (TYLENOL) 500 MG tablet Take 500 mg by mouth every 8 (eight) hours as needed for moderate pain or mild pain (or headaches).    [provider]  ALPRAZolam Duanne Moron) 0.25 MG tablet Take 0.5-1 tablets (0.125-0.25 mg total) by mouth 2 (two) times daily as needed for anxiety. 08/18/20   [name], NP  Alum & Mag Hydroxide-Simeth (MYLANTA PO) Take 20 mLs by mouth at bedtime.    [provider]  amoxicillin (AMOXIL) 500 MG tablet Take 4 tablets (2,000 mg total) by mouth as directed. 1 hour prior to dental work including cleanings Patient taking differently: Take 2,000 mg by mouth See admin instructions. Take 2,000 mg by mouth one hour prior to dental work, including cleanings 11/26/19   Eileen Stanford, PA-C  Ascorbic Acid (VITAMIN C) 1000 MG tablet Take 1,000 mg by mouth daily.    [provider]  Cholecalciferol (VITAMIN D-3) 5000 units TABS Take 5,000 Units by mouth 3 (three) times daily.     [provider]  clopidogrel (PLAVIX) 75 MG tablet Take 1 tablet (75 mg total) by mouth  daily with breakfast. 11/29/20   Bhagat, Bhavinkumar, PA  Cyanocobalamin 5000 MCG SUBL Place 5,000 mcg under the tongue daily.     [provider]  erythromycin ophthalmic ointment Place 1 application into both eyes at bedtime as needed (for styes).    [provider]  ferrous sulfate 325 (65 FE) MG tablet Take 325 mg by mouth daily with lunch.    [provider]  furosemide (LASIX) 20 MG tablet Take 1 tablet (20 mg total) by mouth daily. Patient taking differently: Take 20 mg by mouth in the morning. 04/07/20 04/02/21  [name], MD  gabapentin (NEURONTIN) 300 MG capsule TAKE 1 CAPSULE BY MOUTH THREE TIMES A DAY Patient taking differently: Take 300 mg by mouth See admin instructions. Take 300 mg by mouth in the morning and with lunch 08/18/20   [name], NP  hydrocortisone (ANUSOL-HC) 2.5 % rectal cream Place rectally 2 (two) times daily. Patient taking differently: Place 1 application rectally 2 (two) times daily as needed for hemorrhoids. 04/11/20   [name], MD  levothyroxine (SYNTHROID) 100 MCG tablet TAKE 1/2 TABLET DAILY FOR THYROID. Patient taking differently: Take 50 mcg by mouth daily before breakfast. 08/18/20   [name], NP  loperamide (IMODIUM A-D) 2 MG tablet Take up to 12 tablets /day as needed for  for Diarrhea Patient taking differently: Take 2 mg by mouth at bedtime. 01/30/19   [name], MD  loratadine (CLARITIN) 10 MG tablet Take 10 mg by  mouth daily with supper.    [provider]  losartan (COZAAR) 50 MG tablet Take 0.5 tablets (25 mg total) by mouth daily. 12/19/20 12/19/21  Debbe Odea, MD  metoprolol succinate (TOPROL-XL) 50 MG 24 hr tablet Take 1 tablet (50 mg total) by mouth daily. 12/19/20   Debbe Odea, MD  nitroGLYCERIN (NITROSTAT) 0.4 MG SL tablet Dissolve 1 tablet under tongue every 5 minutes if needed for Angina Patient taking differently: Place 0.4 mg under the tongue every 5 (five) minutes as needed  (FOR ANGINA). 09/20/19   [name], MD  ondansetron (ZOFRAN-ODT) 8 MG disintegrating tablet Dissolve 1 tablet under tongue every 6 to 8 hours  3 x /day if needed for Nausea or Vomitting Patient taking differently: Take 8 mg by mouth See admin instructions. Dissolve 8 mg under the tongue every 6-8 hours as needed for nausea or vomiting 02/12/20   [name], MD  pantoprazole (PROTONIX) 40 MG tablet Take 1 tablet (40 mg total) by mouth 2 (two) times daily. Further refills by PCP Patient taking differently: Take 40 mg by mouth in the morning and at bedtime. 11/28/20   Bhagat, Crista Luria, PA  potassium chloride (KLOR-CON) 10 MEQ tablet Take 10 mEq by mouth in the morning.    [provider]  pravastatin (PRAVACHOL) 20 MG tablet Take 1 tablet (20 mg total) by mouth daily. Patient taking differently: Take 20 mg by mouth every evening. 11/17/20   [name], [name], MD  Propylene Glycol (SYSTANE COMPLETE) 0.6 % SOLN Place 1 drop into both eyes 3 (three) times daily as needed (irritation/dry eyes.).    [provider]  simethicone (MYLICON) 80 MG chewable tablet Chew 80 mg by mouth every 6 (six) hours as needed for flatulence.    [provider]  sucralfate (CARAFATE) 1 g tablet Take 1 g by mouth in the morning, at noon, and at bedtime.    [provider]  tobramycin-dexamethasone [facility]) ophthalmic solution Place 1 drop into both eyes daily as needed (for styes).    [provider]  triamcinolone cream (KENALOG) 0.1 % Apply 1 application topically 2 (two) times daily as needed (rash/skin irritation.).     [provider]  verapamil (CALAN-SR) 120 MG CR tablet TAKE 1 TABLET 2 X /DAY WITH A MEAL FOR BP & HEART RHYTHM Patient taking differently: Take 120 mg by mouth 2 (two) times daily. 01/28/20   [name], MD

## 2020-12-23 ENCOUNTER — Telehealth: Payer: Self-pay | Admitting: Oncology

## 2020-12-23 ENCOUNTER — Other Ambulatory Visit: Payer: Self-pay

## 2020-12-23 ENCOUNTER — Telehealth: Payer: Self-pay

## 2020-12-23 ENCOUNTER — Ambulatory Visit (INDEPENDENT_AMBULATORY_CARE_PROVIDER_SITE_OTHER): Payer: Medicare Other | Admitting: Internal Medicine

## 2020-12-23 ENCOUNTER — Encounter: Payer: Self-pay | Admitting: Internal Medicine

## 2020-12-23 VITALS — BP 120/60 | HR 86 | Temp 97.5°F | Resp 16 | Ht 59.0 in | Wt 93.6 lb

## 2020-12-23 DIAGNOSIS — E876 Hypokalemia: Secondary | ICD-10-CM

## 2020-12-23 DIAGNOSIS — J910 Malignant pleural effusion: Secondary | ICD-10-CM | POA: Diagnosis not present

## 2020-12-23 DIAGNOSIS — I495 Sick sinus syndrome: Secondary | ICD-10-CM

## 2020-12-23 DIAGNOSIS — I482 Chronic atrial fibrillation, unspecified: Secondary | ICD-10-CM | POA: Diagnosis not present

## 2020-12-23 DIAGNOSIS — I739 Peripheral vascular disease, unspecified: Secondary | ICD-10-CM | POA: Diagnosis not present

## 2020-12-23 DIAGNOSIS — N1832 Chronic kidney disease, stage 3b: Secondary | ICD-10-CM | POA: Diagnosis not present

## 2020-12-23 DIAGNOSIS — Z950 Presence of cardiac pacemaker: Secondary | ICD-10-CM

## 2020-12-23 DIAGNOSIS — Z79899 Other long term (current) drug therapy: Secondary | ICD-10-CM

## 2020-12-23 DIAGNOSIS — E039 Hypothyroidism, unspecified: Secondary | ICD-10-CM | POA: Diagnosis not present

## 2020-12-23 DIAGNOSIS — I5031 Acute diastolic (congestive) heart failure: Secondary | ICD-10-CM

## 2020-12-23 DIAGNOSIS — Z952 Presence of prosthetic heart valve: Secondary | ICD-10-CM

## 2020-12-23 NOTE — Telephone Encounter (Signed)
Left message for [facility] that it is okay for PT one time a week for 8 weeks.  Dr Melford Aase is aware of drug interaction with Xanax and Verapimil.

## 2020-12-23 NOTE — Patient Instructions (Signed)
Pleural Effusion  Pleural effusion is an abnormal buildup of fluid in the layers of tissue between the lungs and the inside of the chest (pleural space). The two layers of tissue that line the lungs and the inside of the chest are called pleura. Usually, there is no air in the space between the pleura, only a thin layer of fluid. Some conditions can cause a large amount of fluid to build up, which can cause the lung to collapse if untreated. A pleural effusion is usually caused by another disease that requires treatment. What are the causes? Pleural effusion can be caused by:  Heart failure.  Certain infections, such as pneumonia or tuberculosis.  Cancer.  A blood clot in the lung (pulmonary embolism).  Complications from surgery, such as from open heart surgery.  Liver disease (cirrhosis).  Kidney disease. What are the signs or symptoms? In some cases, pleural effusion may cause no symptoms. If symptoms are present, they may include:  Shortness of breath, especially when lying down.  Chest pain. This may get worse when taking a deep breath.  Fever.  Dry, long-lasting (chronic) cough.  Hiccups.  Rapid breathing. An underlying condition that is causing the pleural effusion (such as heart failure, pneumonia, blood clots, tuberculosis, or cancer) may also cause other symptoms. How is this diagnosed? This condition may be diagnosed based on:  Your symptoms and medical history.  A physical exam.  A chest X-ray.  A procedure to use a needle to remove fluid from the pleural space (thoracentesis). This fluid is tested.  Other imaging studies of the chest, such as ultrasound or CT scan. How is this treated? Depending on the cause of your condition, treatment may include:  Treating the underlying condition that is causing the effusion. When that condition improves, the effusion will also improve. Examples of treatment for underlying conditions include: ? Antibiotic medicines  to treat an infection. ? Diuretics or other heart medicines to treat heart failure.  Thoracentesis.  Placing a thin flexible tube under your skin and into your chest to continuously drain the effusion (indwelling pleural catheter).  Surgery to remove the outer layer of tissue from the pleural space (decortication).  A procedure to put medicine into the chest cavity to seal the pleural space and prevent fluid buildup (pleurodesis).  Chemotherapy and radiation therapy, if you have cancerous (malignant) pleural effusion. These treatments are typically used to treat cancer. They kill certain cells in the body.  Follow these instructions at home:  Take over-the-counter and prescription medicines only as told by your health care provider.  Ask your health care provider what activities are safe for you.  Keep track of how long you are able to do mild exercise (such as walking) before you get short of breath. Write down this information to share with your health care provider. Your ability to exercise should improve over time.  Do not use any products that contain nicotine or tobacco, such as cigarettes and e-cigarettes. If you need help quitting, ask your health care provider.  Keep all follow-up visits as told by your health care provider. This is important. Contact a health care provider if:  The amount of time that you are able to do mild exercise: ? Decreases. ? Does not improve with time.  You have a fever. Get help right away if:  You are short of breath.  You develop chest pain.  You develop a new cough. Summary  Pleural effusion is an abnormal buildup of fluid in  the layers of tissue between the lungs and the inside of the chest.  Pleural effusion can have many causes, including heart failure, pulmonary embolism, infections, or cancer.  Symptoms of pleural effusion can include shortness of breath, chest pain, fever, long-lasting (chronic) cough, hiccups, or rapid  breathing.  Diagnosis often involves making images of the chest (such as with ultrasound or X-ray) and removing fluid (thoracentesis) to send for testing.  Treatment for pleural effusion depends on what underlying condition is causing it.

## 2020-12-23 NOTE — Progress Notes (Signed)
Future Appointments  Date Time Provider [place]  12/23/2020  9:00 AM [name], MD [facility] None  01/13/2021  9:15 AM Almyra Deforest, PA [facility]  01/28/2021 10:50 AM [name], MD [facility]  03/11/2021 11:40 AM [name], [name], MD [facility]  03/30/2021  2:00 PM [name], MD [facility] None  08/19/2021 10:30 AM [name], NP [facility] None    [name]     This very nice 87 y.o. WWF was admitted to the hospital on 12/16/2020  and patient was discharged from the hospital on  12/19/2020 . The patient now presents for follow up for transition from recent hospitalization or SNIF stay.  The day after discharge  our clinical staff contacted the patient to assure stability and schedule a follow up appointment. The discharge summary, medications and diagnostic test results were reviewed before meeting with the patient. The patient was admitted for:   Acute diastolic congestive heart failure (HCC)   Malignant pleural effusion  Hypothyroidism Chronic atrial fibrillation (HCC) Tachy-brady syndrome (HCC) Stage 3b chronic kidney disease (HCC) S/P TAVR (transcatheter aortic valve replacement) PAD (peripheral artery disease) (HCC) Presence of permanent cardiac pacemaker Hypokalemia        This very nice 87 y.o.WWF with  multiple medical co-morbidities HTN, pAfib / Sick Sinus Syndrome,hx/o Severe AoS - s/p TAVR ,HLD, Prediabetes and Vitamin D Deficiency was recently hospitalized with Acte on Chronic Diastolic Heart failure ans was note to have a large right pleural effusion which was tapped and grew suspected contaminant of Staph Hominis.   Patient has prior hx/o Uterine Ca (1994),Transitional CellKidney Ca (2003), Gastric Ca (2017)and Bladder Ca (2019).   Post hospitalization, the Cytology returned (+) positive for Adenoca cells felt likely of uroepithelial origin. Apparently her pleural effusion & acute dyspnea  in the absence of peripheral edema was due to the malignant effusion and not heart failure, but (+) Cytology did not return until after hospital discharge.    Patient's last Oncology visit was in Oct 2019 with Dr [name]. And will refer urgent. All discussed with patient & daughter [name].       Hospitalization discharge instructions and medications are reconciled with the patient.      Patient is also followed with Hypertension, Hyperlipidemia, Pre-Diabetes and Vitamin D Deficiency.      Patient is treated for HTN & BP has been controlled at home. Today's BP is at goal - 120/60. Patient has had no complaints of any cardiac type chest pain, palpitations, dyspnea/orthopnea/PND, dizziness, claudication, or dependent edema.     Hyperlipidemia is controlled with diet & meds. Patient denies myalgias or other med SE's. Last Lipids were  Lab Results  Component Value Date   CHOL 131 08/18/2020   HDL 41 (L) 08/18/2020   LDLCALC 71 08/18/2020   TRIG 107 08/18/2020   CHOLHDL 3.2 08/18/2020      Also, the patient has history of T2_NIDDM PreDiabetes and has had no symptoms of reactive hypoglycemia, diabetic polys, paresthesias or visual blurring.  Last A1c was  Lab Results  Component Value Date   HGBA1C 4.6 02/12/2020      Further, the patient also has history of Vitamin D Deficiency and supplements vitamin D without any suspected side-effects. Last vitamin D was   Lab Results  Component Value Date   VD25OH 68 02/12/2020   Current Outpatient Medications on File Prior to Visit  Medication Sig  . acetaminophen (TYLENOL) 500 MG tablet Take every 8  hours as needed   . ALPRAZolam  0.25 MG tablet Take 0.5-1 tablets  2 times daily as needed   . Alum & Mag Hydroxide-Simeth (MYLANTA PO) Take 20 mLs by mouth at bedtime.  [name] amoxicillin (AMOXIL) 500 MG tablet Take 4 tablets  as directed. 1 hour prior to dental work including cleanings  . VITAMIN C 1000 MG tablet Take 1,000 mg by mouth daily.  [name]  VITAMIN D 5000 units TABS Take 5,000 Units by mouth 3 (three) times daily.   . clopidogrel (PLAVIX) 75 MG tablet Take 1 tablet (75 mg total) by mouth daily with breakfast.  . Cyanocobalamin 5000 MCG SUBL Place 5,000 mcg under the tongue daily.   [name] erythromycin ophthalmic ointment Place 1 application into both eyes at bedtime as needed   . ferrous sulfate 325 (65 FE) MG tablet Take  daily with lunch.  . Furosemide 20 MG tablet Take 1 tabletdaily.   [name] gabapentin 300 MG capsule TAKE 1 CAPSULE  THREE TIMES A DAY   . ANUSOL-HC 2.5 % rectal crm Place rectally 2  times daily as needed   . levothyroxine 100 MCG tablet TAKE 1/2 TABLET DAILY   . IMODIUM  2 MG tablet Take  at bedtime.)  . loratadine 10 MG tablet Take  daily with supper.  . losartan  50 MG tablet Take 0.5 tablets  daily.  . metoprolol succinate-XL 50 MG  Take 1 tablet  daily.  [name] NITROSTAT 0.4 MG SL tablet  if needed for Angina  . ondansetron -ODT 8 MG  Dissolve 1 tablet under tongue every 6 to 8 hours  3 x /day if needed   . pantoprazole  40 MG tablet Take 40 mg by mouth in the morning and at bedtime.)  . potassium chloride10 MEQ tablet Take 10 mEq by mouth in the morning.  . pravastatin (20 MG tablet Take 1 tablet (20 mg total) by mouth daily. (Patient taking differently: Take 20 mg by mouth every evening.)  . SYSTANE  0.6 % SOLN Place 1 drop into both eyes 3  times daily as needed   . MYLICON 80 MG chewable tablet Chew 80 mg by mouth every 6 (six) hours as needed for flatulence.  . sucralfate ( 1 g tablet TAKE 1 TABLET  4 (FOUR) TIMES DAILY  . TOBRADEX) ophth soln Place 1 drop into both eyes daily as needed   . triamcinolone cream  0.1 % Apply 1 application topically 2  times daily as needed  . verapamil -SR 120 MG CR  TAKE 1 TABLET 2 X /DAY     Allergies  Allergen Reactions  . Adhesive [Tape] Other (See Comments)    Tears skin, Please use "paper" tape  . Amiodarone Other (See Comments)     Thyroid and liver and kidney problems   . Hydrocodone Other (See Comments)    HALLLUCINATIONS  . Other Other (See Comments)    NO BLOOD PRODUCTS -  Jehovah's Witness  . Reglan [Metoclopramide] Hives, Itching and Rash  . Remeron [Mirtazapine] Other (See Comments)    Hallucinations and makes the patient groggy   . Codeine Nausea And Vomiting  . Morphine And Related Nausea And Vomiting  . Requip [Ropinirole Hcl] Other (See Comments)    Headaches   . Zinc Nausea Only   PMHx:   Past Medical History:  Diagnosis Date  . Adenocarcinoma of stomach ([place]) 11/11/2015   gastric mass on egd,   . Anal fissure   . Anemia  hx 3/17 iron infusion, on aranesp and injected B12 since 11/2015.   [name] Anxiety   . Aortic root dilatation ([place])   . Arthritis    oa  . Bladder cancer ([place])   . Blind left eye 2015   partial blind in left eye due to stroke   . 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 CAD (coronary artery disease)   . Cataracts, bilateral   . Chronic kidney disease    only has one kidney rt lft rem 03 ca     dr. Risa Grill, bladder cancer 05-04-2018  . Elevated LFTs    hepatic steatosis, none recent  . Endometrial cancer ([place])   . Gastroparesis   . GERD (gastroesophageal reflux disease)   . Hearing loss    wears hearing aids  . Heart murmur   . History of uterine cancer 1980s   treated with hysterectomy, external radiation and radiation seed implants.   [name] HTN (hypertension)   . Hyperlipemia   . Hypothyroidism   . Iron deficiency anemia due to chronic blood loss 11/24/2015  . Neuropathy    right leg  . Osteomyelitis ([place]) as child  . PAF (paroxysmal atrial fibrillation) ([place])   . Presence of permanent cardiac pacemaker   . Refusal of blood transfusions as patient is Jehovah's Witness   . Restless leg syndrome   . 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 Severe aortic stenosis   . Stroke [facility]) 2015   partial stroke left  legally blind; left eye   . Tachycardia-bradycardia syndrome ([place])    s/p PPM by Dr Doreatha Lew (MDT) 07/03/10  . 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 Walker as ambulation aid   . Wears glasses   . Wears partial dentures    upper   Immunization History  Administered Date(s) Administered  . Fluad Quad(high Dose 65+) 05/11/2019  . Influenza, High Dose Seasonal PF 07/18/2015, 06/23/2017, 06/27/2018, 06/30/2020  . Influenza,inj,Quad PF,6+ Mos 05/31/2014, 06/09/2016  . Influenza-Unspecified 09/16/2009, 07/03/2013  . Janssen (J&J) SARS-COV-2 Vaccination 12/12/2019, 07/10/2020  . Pneumococcal Conjugate-13 09/06/2002  . Pneumococcal Polysaccharide-23 07/18/2015  . Td 09/06/2002  . Zoster 09/06/2008   Past Surgical History:  Procedure Laterality Date  . ABDOMINAL AORTOGRAM W/LOWER EXTREMITY Bilateral 11/27/2020   Procedure: ABDOMINAL AORTOGRAM W/LOWER EXTREMITY;  Surgeon: Lorretta Harp, MD;  Location: [facility];  Service: Cardiovascular;  Laterality: Bilateral;  limited to iliacs  . CARDIAC CATHETERIZATION  2008  . CARDIOVERSION N/A 12/05/2019   Procedure: CARDIOVERSION;  Surgeon: Elouise Munroe, MD;  Location: [facility] ENDOSCOPY;  Service: Cardiovascular;  Laterality: N/A;  . CARDIOVERSION N/A 06/13/2020   Procedure: CARDIOVERSION;  Surgeon: Buford Dresser, MD;  Location: [facility] ENDOSCOPY;  Service: Cardiovascular;  Laterality: N/A;  . CHOLECYSTECTOMY  1970s  . COLONOSCOPY N/A 11/11/2015   Procedure: COLONOSCOPY;  Surgeon: [name], MD;  Location: [facility] ENDOSCOPY;  Service: Endoscopy;  Laterality: N/A;  . CYSTOSCOPY W/ RETROGRADES Right 05/01/2018   Procedure: CYSTOSCOPY WITH RIGHT RETROGRADE PYELOGRAM;  Surgeon: Lucas Mallow, MD;  Location: [facility] ORS;  Service: Urology;  Laterality: Right;  . CYSTOSCOPY W/ RETROGRADES Right 07/21/2018   Procedure: CYSTOSCOPY WITH RIGHT RETROGRADE PYELOGRAM;  Surgeon: Alexis Frock, MD;  Location: [facility] OR[facility];  Service:  Urology;  Laterality: Right;  . ESOPHAGOGASTRODUODENOSCOPY N/A 11/11/2015   Procedure: ESOPHAGOGASTRODUODENOSCOPY (EGD);  Surgeon: [name], MD;  [facility] ENDOS[facility];  Service: Endoscopy;  Laterality: N/A;  . ESOPHAGOGASTRODUODENOSCOPY N/A 02/05/2016   Procedure: ESOPHAGOGASTRODUODENOSCOPY (EGD);  Surgeon: Ladene Artist, MD;  Location[facility] ENDOSCOPY;  Service: Endoscopy;  Laterality: N/A;  . ESOPHAGOGASTRODUODENOSCOPY N/A 04/02/2016   Procedure: ESOPHAGOGASTRODUODENOSCOPY (EGD);  Surgeon: [name], MD;  Location: [facility];  Service: Endoscopy;  Laterality: N/A;  . ESOPHAGOGASTRODUODENOSCOPY (EGD) WITH PROPOFOL N/A 04/06/2017   Procedure: ESOPHAGOGASTRODUODENOSCOPY (EGD) WITH PROPOFOL;  Surgeon: [name], MD;  [facility] ENDOS[facility];  Service: Endoscopy;  Laterality: N/A;  . EYE SURGERY Bilateral    remove cataracts  . GASTRECTOMY N/A 01/15/2016   Procedure: OPEN PARTIAL GASTRECTOMY;  Surgeon: Stark Klein, MD;  Location: [place];  Service: General;  Laterality: N/A;  . GASTRECTOMY Left 02/24/2016   Procedure: OPEN J TUBE;  Surgeon: Stark Klein, MD;  Location: [place];  Service: General;  Laterality: Left;  [name] GASTROJEJUNOSTOMY N/A 05/18/2016   Procedure: ROUX EN [place];  Surgeon: Stark Klein, MD;  Location: [place];  Service: General;  Laterality: N/A;  . I & D KNEE WITH POLY EXCHANGE Right 12/17/2013   Procedure: IRRIGATION AND DEBRIDEMEN RIGHT TOTAL  KNEE WITH POLY EXCHANGE;  Surgeon: Mauri Pole, MD;  [facility];  Service: Orthopedics;  Laterality: Right;  . IR GENERIC HISTORICAL  06/17/2016   IR RADIOLOGIST EVAL & MGMT 06/17/2016 Ascencion Dike, PA-C [facility] INTERV RAD  . IR THORACENTESIS ASP PLEURAL SPACE W/IMG GUIDE  12/17/2020  . LAPAROSCOPY N/A 01/15/2016   Procedure: LAPAROSCOPY DIAGNOSTIC;  Surgeon: Stark Klein, MD;  Location: [place];  Service: General;  Laterality: N/A;  . LYSIS OF ADHESION  05/18/2016   Procedure: LYSIS OF ADHESION;  Surgeon: Stark Klein, MD;  Location: [place];  Service: General;;  . NEPHRECTOMY Left    renal cell cancer  . PACEMAKER INSERTION  07/04/2011   MDT PPM implanted by Dr Doreatha Lew for tachy/brady; managed by Dr Thompson Grayer   . PATELLAR TENDON REPAIR Right 03/06/2014   Procedure: AVULSION WITH PATELLA TENDON REPAIR;  Surgeon: Mauri Pole, MD;  [facility];  Service: Orthopedics;  Laterality: Right;  . PATENT DUCTUS ARTERIOUS REPAIR  ~ 1949  . PERIPHERAL VASCULAR ATHERECTOMY Right 11/27/2020   Procedure: PERIPHERAL VASCULAR ATHERECTOMY;  Surgeon: Lorretta Harp, MD;  Location: [place] [facility];  Service: Cardiovascular;  Laterality: Right;  ext iliac  . PERIPHERAL VASCULAR INTERVENTION Right 11/27/2020   Procedure: PERIPHERAL VASCULAR INTERVENTION;  Surgeon: Lorretta Harp, MD;  Location: [place] [facility] LAB;  Service: Cardiovascular;  Laterality: Right;  ext iliac  . RIGHT/LEFT HEART CATH AND CORONARY ANGIOGRAPHY N/A 10/09/2019   Procedure: RIGHT/LEFT HEART CATH AND CORONARY ANGIOGRAPHY;  Surgeon: [name], [name], MD;  Location: [place] [facility];  Service: Cardiovascular;  Laterality: N/A;  . TEE WITHOUT CARDIOVERSION N/A 10/30/2019   Procedure: TRANSESOPHAGEAL ECHOCARDIOGRAM (TEE);  Surgeon: Burnell Blanks, MD;  Location: [place];  Service: Open Heart Surgery;  Laterality: N/A;  . TEE WITHOUT CARDIOVERSION N/A 06/13/2020   Procedure: TRANSESOPHAGEAL ECHOCARDIOGRAM (TEE);  Surgeon: Buford Dresser, MD;  Location: [facility] ENDOSCOPY;  Service: Cardiovascular;  Laterality: N/A;  . TOTAL ABDOMINAL HYSTERECTOMY  85  . TOTAL HIP REVISION Right 2009   initial replacment surg  . TOTAL KNEE ARTHROPLASTY  02/07/2012   Procedure: TOTAL KNEE ARTHROPLASTY;  Surgeon: Mauri Pole, MD;  [facility];  Service: Orthopedics;  Laterality: Right;  . TOTAL KNEE REVISION Right 07/29/2014   Procedure: RIGHT KNEE  REVISION OF PREVIOUS REPAIR EXTENSOR MECHANISM;  Surgeon: Mauri Pole, MD;  [facility];   Service: Orthopedics;  Laterality: Right;  . TRANSCATHETER AORTIC VALVE REPLACEMENT, CAROTID Left 10/30/2019   TRANSCATHETER AORTIC VALVE REPLACEMENT, LEFT CAROTID (Left Neck)   . TRANSCATHETER AORTIC VALVE REPLACEMENT, CAROTID Left 10/30/2019   Procedure: TRANSCATHETER AORTIC VALVE REPLACEMENT, LEFT CAROTID;  Surgeon: Burnell Blanks, MD;  Location: [place];  Service: Open Heart Surgery;  Laterality: Left;  . TRANSURETHRAL RESECTION OF BLADDER TUMOR N/A 07/21/2018   Procedure: TRANSURETHRAL RESECTION OF BLADDER TUMOR (TURBT);  Surgeon: Alexis Frock, MD;  [facility];  Service: Urology;  Laterality: N/A;  . TRANSURETHRAL RESECTION OF BLADDER TUMOR WITH MITOMYCIN-C N/A 05/01/2018   Procedure: TRANSURETHRAL RESECTION OF BLADDER TUMOR WITH POST OP INSTILLATION OF GEMCITABINE;  Surgeon: Lucas Mallow, MD;  [facility];  Service: Urology;  Laterality: N/A;  . WHIPPLE PROCEDURE N/A 05/18/2016   Procedure: EXPLORATORY LAPAROTOMY;  Surgeon: Stark Klein, MD;  Location: [place];  Service: General;  Laterality: N/A;   FHx:    Reviewed / unchanged  SHx:    Reviewed / unchanged  Systems Review:  Constitutional: Denies fever, chills, wt changes, headaches, insomnia, fatigue, night sweats, change in appetite. Eyes: Denies redness, blurred vision, diplopia, discharge, itchy, watery eyes.  ENT: Denies discharge, congestion, post nasal drip, epistaxis, sore throat, earache, hearing loss, dental pain, tinnitus, vertigo, sinus pain, snoring.  CV: Denies chest pain, palpitations, irregular heartbeat, syncope, dyspnea, diaphoresis, orthopnea, PND, claudication or edema. Respiratory: denies cough, dyspnea, DOE, pleurisy, hoarseness, laryngitis, wheezing.  Gastrointestinal: Denies dysphagia, odynophagia, heartburn, reflux, water brash, abdominal pain or cramps, nausea, vomiting, bloating, diarrhea, constipation, hematemesis, melena, hematochezia  or hemorrhoids. Genitourinary: Denies dysuria,  frequency, urgency, nocturia, hesitancy, discharge, hematuria or flank pain. Musculoskeletal: Denies arthralgias, myalgias, stiffness, jt. swelling, pain, limping or strain/sprain.  Skin: Denies pruritus, rash, hives, warts, acne, eczema or change in skin lesion(s). Neuro: No weakness, tremor, incoordination, spasms, paresthesia or pain. Psychiatric: Denies confusion, memory loss or sensory loss. Endo: Denies change in weight, skin or hair change.  Heme/Lymph: No excessive bleeding, bruising or enlarged lymph nodes.  Physical Exam  BP 120/60   Pulse 86   Temp (!) 97.5 F (36.4 C) (Temporal)   Resp 16   Ht 4\' 11"  (1.499 m)   Wt 93 lb 9.6 oz (42.5 kg)   LMP  (LMP Unknown)   SpO2 99%   BMI 18.90 kg/m   Appears well nourished, well groomed  and in no distress.  Eyes: PERRLA, EOMs, conjunctiva no swelling or erythema. Sinuses: No frontal/maxillary tenderness ENT/Mouth: EAC's clear, TM's nl w/o erythema, bulging. Nares clear w/o erythema, swelling, exudates. Oropharynx clear without erythema or exudates. Oral hygiene is good. Tongue normal, non obstructing. Hearing intact.  Neck: Supple. Thyroid nl. Car 2+/2+ without bruits, nodes or JVD. Chest: Respirations nl with BS clear & equal w/o rales, rhonchi, wheezing or stridor.  Cor: Heart sounds normal w/ regular rate and rhythm without sig. murmurs, gallops, clicks or rubs. Peripheral pulses normal and equal  without edema.  Abdomen: Soft & bowel sounds normal. Non-tender w/o guarding, rebound, hernias, masses or organomegaly.  Lymphatics: Unremarkable.  Musculoskeletal: Full ROM all peripheral extremities, joint stability, 5/5 strength and normal gait.  Skin: Warm, dry without exposed rashes, lesions or ecchymosis apparent.  Neuro: Cranial nerves intact, reflexes equal bilaterally. Sensory-motor testing grossly intact. Tendon reflexes grossly intact.  Pysch: Alert & oriented x 3.  Insight and judgement nl &  appropriate. No  ideations.  Assessment and Plan:  1. Acute diastolic congestive heart failure (HCC)  - COMPLETE METABOLIC PANEL WITH GFR  2. Malignant pleural effusion  - Ambulatory referral to Hematology / Oncology  3. Hypothyroidism   4. Chronic atrial fibrillation (HCC)   5. Tachy-brady syndrome (HCC)   6. Stage 3b chronic kidney disease (HCC)  - CBC with Differential/Platelet - COMPLETE METABOLIC PANEL WITH GFR  7. S/P TAVR (transcatheter aortic valve replacement)   8. PAD (peripheral artery disease) ([place])   9. Presence of permanent cardiac pacemaker   10. Hypokalemia  - COMPLETE METABOLIC PANEL WITH GFR  11. Medication management  - CBC with Differential/Platelet - COMPLETE METABOLIC PANEL WITH GFR         Discussed  regular exercise, BP monitoring, weight control to achieve/maintain BMI less than 25 and discussed meds and SE's. Recommended labs to assess and monitor clinical status with further disposition pending results of labs. Over 30 minutes of exam, counseling, chart review was performed.   Kirtland Bouchard, MD

## 2020-12-23 NOTE — Telephone Encounter (Signed)
Received a new pt referral from Dr. Melford Aase for malignant pleural effusion. [name] has been scheduled to see Dr. Alen Blew on 4/20 at 11am. Appt date and time has been given to the pt's daughter. Aware to arrive 20 minutes.

## 2020-12-24 ENCOUNTER — Other Ambulatory Visit: Payer: Self-pay

## 2020-12-24 ENCOUNTER — Inpatient Hospital Stay: Payer: Medicare Other | Attending: Oncology | Admitting: Oncology

## 2020-12-24 VITALS — BP 138/88 | HR 86 | Temp 97.8°F | Resp 15 | Ht 59.0 in | Wt 93.7 lb

## 2020-12-24 DIAGNOSIS — Z800 Family history of malignant neoplasm of digestive organs: Secondary | ICD-10-CM | POA: Diagnosis not present

## 2020-12-24 DIAGNOSIS — I509 Heart failure, unspecified: Secondary | ICD-10-CM

## 2020-12-24 DIAGNOSIS — Z8542 Personal history of malignant neoplasm of other parts of uterus: Secondary | ICD-10-CM

## 2020-12-24 DIAGNOSIS — I2510 Atherosclerotic heart disease of native coronary artery without angina pectoris: Secondary | ICD-10-CM | POA: Diagnosis not present

## 2020-12-24 DIAGNOSIS — J910 Malignant pleural effusion: Secondary | ICD-10-CM | POA: Diagnosis not present

## 2020-12-24 DIAGNOSIS — C641 Malignant neoplasm of right kidney, except renal pelvis: Secondary | ICD-10-CM | POA: Diagnosis not present

## 2020-12-24 LAB — CBC WITH DIFFERENTIAL/PLATELET
Absolute Monocytes: 470 cells/uL (ref 200–950)
Basophils Absolute: 32 cells/uL (ref 0–200)
Basophils Relative: 0.6 %
Eosinophils Absolute: 248 cells/uL (ref 15–500)
Eosinophils Relative: 4.6 %
HCT: 37.5 % (ref 35.0–45.0)
Hemoglobin: 11.9 g/dL (ref 11.7–15.5)
Lymphs Abs: 1112 cells/uL (ref 850–3900)
MCH: 31.7 pg (ref 27.0–33.0)
MCHC: 31.7 g/dL — ABNORMAL LOW (ref 32.0–36.0)
MCV: 100 fL (ref 80.0–100.0)
MPV: 10.7 fL (ref 7.5–12.5)
Monocytes Relative: 8.7 %
Neutro Abs: 3537 cells/uL (ref 1500–7800)
Neutrophils Relative %: 65.5 %
Platelets: 238 10*3/uL (ref 140–400)
RBC: 3.75 10*6/uL — ABNORMAL LOW (ref 3.80–5.10)
RDW: 13.3 % (ref 11.0–15.0)
Total Lymphocyte: 20.6 %
WBC: 5.4 10*3/uL (ref 3.8–10.8)

## 2020-12-24 LAB — COMPLETE METABOLIC PANEL WITH GFR
AG Ratio: 1 (calc) (ref 1.0–2.5)
ALT: 6 U/L (ref 6–29)
AST: 15 U/L (ref 10–35)
Albumin: 3.3 g/dL — ABNORMAL LOW (ref 3.6–5.1)
Alkaline phosphatase (APISO): 100 U/L (ref 37–153)
BUN/Creatinine Ratio: 26 (calc) — ABNORMAL HIGH (ref 6–22)
BUN: 32 mg/dL — ABNORMAL HIGH (ref 7–25)
CO2: 25 mmol/L (ref 20–32)
Calcium: 8.7 mg/dL (ref 8.6–10.4)
Chloride: 106 mmol/L (ref 98–110)
Creat: 1.21 mg/dL — ABNORMAL HIGH (ref 0.60–0.88)
GFR, Est African American: 48 mL/min/{1.73_m2} — ABNORMAL LOW (ref >=60)
GFR, Est Non African American: 41 mL/min/{1.73_m2} — ABNORMAL LOW (ref >=60)
Globulin: 3.3 g/dL (calc) (ref 1.9–3.7)
Glucose, Bld: 101 mg/dL — ABNORMAL HIGH (ref 65–99)
Potassium: 4.2 mmol/L (ref 3.5–5.3)
Sodium: 140 mmol/L (ref 135–146)
Total Bilirubin: 0.5 mg/dL (ref 0.2–1.2)
Total Protein: 6.6 g/dL (ref 6.1–8.1)

## 2020-12-24 NOTE — Progress Notes (Signed)
Reason for the request:    Malignant pleural effusion  HPI: I was asked by Dr. Melford Aase to evaluate [name] for malignant pleural effusion.  She is an 87 year old woman with bladder cancer diagnosed in 2019.  She presented with muscle invasive disease at that time after a TURBT without any evidence of metastasis.  She also has history of endometrial cancer with remote diagnosis and received pelvic radiation at that time.  He did not receive any definitive therapy for her bladder cancer and has had multiple health issues including coronary disease and congestive heart failure.  A repeat cystoscopy and a biopsy in November 2019 showed noninvasive high-grade urothelial carcinoma was detected.  She was recently hospitalized between April 12 and April 15 after presenting with congestive heart failure and worsening shortness of breath and was found to have bilateral pleural effusion.  She underwent ultrasound-guided right thoracentesis which yielded 800 cc completed on December 17, 2020.  The final cytology showed malignant cells consistent with adenocarcinoma.  Immunohistochemical stains showed the tumors are positive for CK7 and GATA3 and negative for CK20 and TTF-1 CDX2 and Napsin A.  This pattern is consistent with urothelial carcinoma consistent with her primary.  Although breast cancer could also be a consideration.  Clinically, she reports feeling slightly better since her recent discharge.  Her breathing has improved without any shortness of breath.  She is still quite debilitated and weak.  Performance status is limited at baseline but does ambulate with the help of a walker.   He does not report any headaches, blurry vision, syncope or seizures. Does not report any fevers, chills or sweats.  Does not report any cough, wheezing or hemoptysis.  Does not report any chest pain, palpitation, orthopnea or leg edema.  Does not report any nausea, vomiting or abdominal pain.  Does not report any constipation  or diarrhea.  Does not report any skeletal complaints.    Does not report frequency, urgency or hematuria.  Does not report any skin rashes or lesions. Does not report any heat or cold intolerance.  Does not report any lymphadenopathy or petechiae.  Does not report any anxiety or depression.  Remaining review of systems is negative.    Past Medical History:  Diagnosis Date  . Adenocarcinoma of stomach ([place]) 11/11/2015   gastric mass on egd,   . Anal fissure   . Anemia    hx 3/17 iron infusion, on aranesp and injected B12 since 11/2015.   [name] Anxiety   . Aortic root dilatation ([place])   . Arthritis    oa  . Bladder cancer ([place])   . Blind left eye 2015   partial blind in left eye due to stroke   . 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 CAD (coronary artery disease)   . Cataracts, bilateral   . Chronic kidney disease    only has one kidney rt lft rem 03 ca     dr. Risa Grill, bladder cancer 05-04-2018  . Elevated LFTs    hepatic steatosis, none recent  . Endometrial cancer ([place])   . Gastroparesis   . GERD (gastroesophageal reflux disease)   . Hearing loss    wears hearing aids  . Heart murmur   . History of uterine cancer 1980s   treated with hysterectomy, external radiation and radiation seed implants.   [name] HTN (hypertension)   . Hyperlipemia   . Hypothyroidism   . Iron deficiency anemia due to chronic blood loss 11/24/2015  . Neuropathy  right leg  . Osteomyelitis ([place]) as child  . PAF (paroxysmal atrial fibrillation) ([place])   . Presence of permanent cardiac pacemaker   . Refusal of blood transfusions as patient is Jehovah's Witness   . Restless leg syndrome   . 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 Severe aortic stenosis   . Stroke [facility]) 2015   partial stroke left legally blind; left eye   . Tachycardia-bradycardia syndrome ([place])    s/p PPM by Dr Doreatha Lew (MDT) 07/03/10  .  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 Walker as ambulation aid   . Wears glasses   . Wears partial dentures    upper  :  Past Surgical History:  Procedure Laterality Date  . ABDOMINAL AORTOGRAM W/LOWER EXTREMITY Bilateral 11/27/2020   Procedure: ABDOMINAL AORTOGRAM W/LOWER EXTREMITY;  Surgeon: Lorretta Harp, MD;  Location: [place] [facility];  Service: Cardiovascular;  Laterality: Bilateral;  limited to iliacs  . CARDIAC CATHETERIZATION  2008  . CARDIOVERSION N/A 12/05/2019   Procedure: CARDIOVERSION;  Surgeon: Elouise Munroe, MD;  Location: [facility] ENDOSCOPY;  Service: Cardiovascular;  Laterality: N/A;  . CARDIOVERSION N/A 06/13/2020   Procedure: CARDIOVERSION;  Surgeon: Buford Dresser, MD;  Location: [facility] ENDOSCOPY;  Service: Cardiovascular;  Laterality: N/A;  . CHOLECYSTECTOMY  1970s  . COLONOSCOPY N/A 11/11/2015   Procedure: COLONOSCOPY;  Surgeon: [name], MD;  [facility]: [facility] ENDOSCOPY;  Service: Endoscopy;  Laterality: N/A;  . CYSTOSCOPY W/ RETROGRADES Right 05/01/2018   Procedure: CYSTOSCOPY WITH RIGHT RETROGRADE PYELOGRAM;  Surgeon: Lucas Mallow, MD;  Location: [facility] ORS;  Service: Urology;  Laterality: Right;  . CYSTOSCOPY W/ RETROGRADES Right 07/21/2018   Procedure: CYSTOSCOPY WITH RIGHT RETROGRADE PYELOGRAM;  Surgeon: Alexis Frock, MD;  [facility];  Service: Urology;  Laterality: Right;  . ESOPHAGOGASTRODUODENOSCOPY N/A 11/11/2015   Procedure: ESOPHAGOGASTRODUODENOSCOPY (EGD);  Surgeon: [name], MD;  Location: [facility];  Service: Endoscopy;  Laterality: N/A;  . ESOPHAGOGASTRODUODENOSCOPY N/A 02/05/2016   Procedure: ESOPHAGOGASTRODUODENOSCOPY (EGD);  Surgeon: Ladene Artist, MD;  Location: [facility] ENDOSCOPY;  Service: Endoscopy;  Laterality: N/A;  . ESOPHAGOGASTRODUODENOSCOPY N/A 04/02/2016   Procedure: ESOPHAGOGASTRODUODENOSCOPY (EGD);  Surgeon: [name], MD;  Location: [facility];  Service: Endoscopy;  Laterality: N/A;  .  ESOPHAGOGASTRODUODENOSCOPY (EGD) WITH PROPOFOL N/A 04/06/2017   Procedure: ESOPHAGOGASTRODUODENOSCOPY (EGD) WITH PROPOFOL;  Surgeon: [name], MD;  [facility];  Service: Endoscopy;  Laterality: N/A;  . EYE SURGERY Bilateral    remove cataracts  . GASTRECTOMY N/A 01/15/2016   Procedure: OPEN PARTIAL GASTRECTOMY;  Surgeon: Stark Klein, MD;  Location: [place];  Service: General;  Laterality: N/A;  . GASTRECTOMY Left 02/24/2016   Procedure: OPEN J TUBE;  Surgeon: Stark Klein, MD;  Location: [place];  Service: General;  Laterality: Left;  [name] GASTROJEJUNOSTOMY N/A 05/18/2016   Procedure: ROUX EN [place];  Surgeon: Stark Klein, MD;  Location: [place];  Service: General;  Laterality: N/A;  . I & D KNEE WITH POLY EXCHANGE Right 12/17/2013   Procedure: IRRIGATION AND DEBRIDEMEN RIGHT TOTAL  KNEE WITH POLY EXCHANGE;  Surgeon: Mauri Pole, MD;  [facility];  Service: Orthopedics;  Laterality: Right;  . IR GENERIC HISTORICAL  06/17/2016   IR RADIOLOGIST EVAL & MGMT 06/17/2016 Ascencion Dike, PA-C [facility] INTERV RAD  . IR THORACENTESIS ASP PLEURAL SPACE W/IMG GUIDE  12/17/2020  . LAPAROSCOPY N/A 01/15/2016   Procedure: LAPAROSCOPY DIAGNOSTIC;  Surgeon: Stark Klein, MD;  Location: [place];  Service: General;  Laterality: N/A;  . LYSIS OF ADHESION  05/18/2016   Procedure: LYSIS OF ADHESION;  Surgeon: Stark Klein, MD;  Location: [place];  Service: General;;  . NEPHRECTOMY Left    renal cell cancer  . PACEMAKER INSERTION  07/04/2011   MDT PPM implanted by Dr Doreatha Lew for tachy/brady; managed by Dr Thompson Grayer   . PATELLAR TENDON REPAIR Right 03/06/2014   Procedure: AVULSION WITH PATELLA TENDON REPAIR;  Surgeon: Mauri Pole, MD;  [facility];  Service: Orthopedics;  Laterality: Right;  . PATENT DUCTUS ARTERIOUS REPAIR  ~ 1949  . PERIPHERAL VASCULAR ATHERECTOMY Right 11/27/2020   Procedure: PERIPHERAL VASCULAR ATHERECTOMY;  Surgeon: Lorretta Harp, MD;  Location: [place] [facility];  Service: Cardiovascular;  Laterality: Right;  ext iliac  . PERIPHERAL VASCULAR INTERVENTION Right 11/27/2020   Procedure: PERIPHERAL VASCULAR INTERVENTION;  Surgeon: Lorretta Harp, MD;  Location: [place] [facility];  Service: Cardiovascular;  Laterality: Right;  ext iliac  . RIGHT/LEFT HEART CATH AND CORONARY ANGIOGRAPHY N/A 10/09/2019   Procedure: RIGHT/LEFT HEART CATH AND CORONARY ANGIOGRAPHY;  Surgeon: [name], [name], MD;  Location: [place] [facility];  Service: Cardiovascular;  Laterality: N/A;  . TEE WITHOUT CARDIOVERSION N/A 10/30/2019   Procedure: TRANSESOPHAGEAL ECHOCARDIOGRAM (TEE);  Surgeon: Burnell Blanks, MD;  Location: [place];  Service: Open Heart Surgery;  Laterality: N/A;  . TEE WITHOUT CARDIOVERSION N/A 06/13/2020   Procedure: TRANSESOPHAGEAL ECHOCARDIOGRAM (TEE);  Surgeon: Buford Dresser, MD;  Location: [facility] ENDOSCOPY;  Service: Cardiovascular;  Laterality: N/A;  . TOTAL ABDOMINAL HYSTERECTOMY  85  . TOTAL HIP REVISION Right 2009   initial replacment surg  . TOTAL KNEE ARTHROPLASTY  02/07/2012   Procedure: TOTAL KNEE ARTHROPLASTY;  Surgeon: Mauri Pole, MD;  [facility];  Service: Orthopedics;  Laterality: Right;  . TOTAL KNEE REVISION Right 07/29/2014   Procedure: RIGHT KNEE REVISION OF PREVIOUS REPAIR EXTENSOR MECHANISM;  Surgeon: Mauri Pole, MD;  [facility];  Service: Orthopedics;  Laterality: Right;  . TRANSCATHETER AORTIC VALVE REPLACEMENT, CAROTID Left 10/30/2019   TRANSCATHETER AORTIC VALVE REPLACEMENT, LEFT CAROTID (Left Neck)   . TRANSCATHETER AORTIC VALVE REPLACEMENT, CAROTID Left 10/30/2019   Procedure: TRANSCATHETER AORTIC VALVE REPLACEMENT, LEFT CAROTID;  Surgeon: [place] Blanks, MD;  Location: [place];  Service: Open Heart Surgery;  Laterality: Left;  . TRANSURETHRAL RESECTION OF BLADDER TUMOR N/A 07/21/2018   Procedure: TRANSURETHRAL RESECTION OF BLADDER TUMOR (TURBT);  Surgeon: Alexis Frock, MD;  [facility];   Service: Urology;  Laterality: N/A;  . TRANSURETHRAL RESECTION OF BLADDER TUMOR WITH MITOMYCIN-C N/A 05/01/2018   Procedure: TRANSURETHRAL RESECTION OF BLADDER TUMOR WITH POST OP INSTILLATION OF GEMCITABINE;  Surgeon: Lucas Mallow, MD;  [facility];  Service: Urology;  Laterality: N/A;  . WHIPPLE PROCEDURE N/A 05/18/2016   Procedure: EXPLORATORY LAPAROTOMY;  Surgeon: Stark Klein, MD;  Location: [place];  Service: General;  Laterality: N/A;  :   Current Outpatient Medications:  .  acetaminophen (TYLENOL) 500 MG tablet, Take 500 mg by mouth every 8 (eight) hours as needed for moderate pain or mild pain (or headaches)., Disp: , Rfl:  .  ALPRAZolam (XANAX) 0.25 MG tablet, Take 0.5-1 tablets (0.125-0.25 mg total) by mouth 2 (two) times daily as needed for anxiety., Disp: 30 tablet, Rfl: 0 .  Alum & Mag Hydroxide-Simeth (  MYLANTA PO), Take 20 mLs by mouth at bedtime., Disp: , Rfl:  .  amoxicillin (AMOXIL) 500 MG tablet, Take 4 tablets (2,000 mg total) by mouth as directed. 1 hour prior to dental work including cleanings (Patient taking differently: Take 2,000 mg by mouth See admin instructions. Take 2,000 mg by mouth one hour prior to dental work, including cleanings), Disp: 12 tablet, Rfl: 12 .  Ascorbic Acid (VITAMIN C) 1000 MG tablet, Take 1,000 mg by mouth daily., Disp: , Rfl:  .  Cholecalciferol (VITAMIN D-3) 5000 units TABS, Take 5,000 Units by mouth 3 (three) times daily. , Disp: , Rfl:  .  clopidogrel (PLAVIX) 75 MG tablet, Take 1 tablet (75 mg total) by mouth daily with breakfast., Disp: 30 tablet, Rfl: 6 .  Cyanocobalamin 5000 MCG SUBL, Place 5,000 mcg under the tongue daily. , Disp: , Rfl:  .  erythromycin ophthalmic ointment, Place 1 application into both eyes at bedtime as needed (for styes)., Disp: , Rfl:  .  ferrous sulfate 325 (65 FE) MG tablet, Take 325 mg by mouth daily with lunch., Disp: , Rfl:  .  furosemide (LASIX) 20 MG tablet, Take 1 tablet (20 mg total) by mouth daily.  (Patient taking differently: Take 20 mg by mouth in the morning.), Disp: 90 tablet, Rfl: 3 .  gabapentin (NEURONTIN) 300 MG capsule, TAKE 1 CAPSULE BY MOUTH THREE TIMES A DAY (Patient taking differently: Take 300 mg by mouth See admin instructions. Take 300 mg by mouth in the morning and with lunch), Disp: 270 capsule, Rfl: 1 .  hydrocortisone (ANUSOL-HC) 2.5 % rectal cream, Place rectally 2 (two) times daily. (Patient taking differently: Place 1 application rectally 2 (two) times daily as needed for hemorrhoids.), Disp: 30 g, Rfl: 1 .  levothyroxine (SYNTHROID) 100 MCG tablet, TAKE 1/2 TABLET DAILY FOR THYROID. (Patient taking differently: Take 50 mcg by mouth daily before breakfast.), Disp: 45 tablet, Rfl: 1 .  loperamide (IMODIUM A-D) 2 MG tablet, Take up to 12 tablets /day as needed for  for Diarrhea (Patient taking differently: Take 2 mg by mouth at bedtime.), Disp: 120 tablet, Rfl: 11 .  loratadine (CLARITIN) 10 MG tablet, Take 10 mg by mouth daily with supper., Disp: , Rfl:  .  losartan (COZAAR) 50 MG tablet, Take 0.5 tablets (25 mg total) by mouth daily., Disp: 90 tablet, Rfl: 0 .  metoprolol succinate (TOPROL-XL) 50 MG 24 hr tablet, Take 1 tablet (50 mg total) by mouth daily., Disp: 30 tablet, Rfl: 0 .  nitroGLYCERIN (NITROSTAT) 0.4 MG SL tablet, Dissolve 1 tablet under tongue every 5 minutes if needed for Angina (Patient taking differently: Place 0.4 mg under the tongue every 5 (five) minutes as needed (FOR ANGINA).), Disp: 50 tablet, Rfl: prn .  ondansetron (ZOFRAN-ODT) 8 MG disintegrating tablet, Dissolve 1 tablet under tongue every 6 to 8 hours  3 x /day if needed for Nausea or Vomitting (Patient taking differently: Take 8 mg by mouth See admin instructions. Dissolve 8 mg under the tongue every 6-8 hours as needed for nausea or vomiting), Disp: 30 tablet, Rfl: 3 .  pantoprazole (PROTONIX) 40 MG tablet, Take 1 tablet (40 mg total) by mouth 2 (two) times daily. Further refills by PCP (Patient  taking differently: Take 40 mg by mouth in the morning and at bedtime.), Disp: 60 tablet, Rfl: 2 .  potassium chloride (KLOR-CON) 10 MEQ tablet, Take 10 mEq by mouth in the morning., Disp: , Rfl:  .  pravastatin (PRAVACHOL) 20 MG tablet,  Take 1 tablet (20 mg total) by mouth daily. (Patient taking differently: Take 20 mg by mouth every evening.), Disp: 90 tablet, Rfl: 1 .  Propylene Glycol (SYSTANE COMPLETE) 0.6 % SOLN, Place 1 drop into both eyes 3 (three) times daily as needed (irritation/dry eyes.)., Disp: , Rfl:  .  simethicone (MYLICON) 80 MG chewable tablet, Chew 80 mg by mouth every 6 (six) hours as needed for flatulence., Disp: , Rfl:  .  sucralfate (CARAFATE) 1 g tablet, TAKE 1 TABLET (1 G TOTAL) BY MOUTH 4 (FOUR) TIMES DAILY - WITH MEALS AND AT BEDTIME., Disp: 360 tablet, Rfl: 3 .  tobramycin-dexamethasone (TOBRADEX) ophthalmic solution, Place 1 drop into both eyes daily as needed (for styes)., Disp: , Rfl:  .  triamcinolone cream (KENALOG) 0.1 %, Apply 1 application topically 2 (two) times daily as needed (rash/skin irritation.). , Disp: , Rfl:  .  verapamil (CALAN-SR) 120 MG CR tablet, TAKE 1 TABLET 2 X /DAY WITH A MEAL FOR BP & HEART RHYTHM (Patient taking differently: Take 120 mg by mouth 2 (two) times daily.), Disp: 180 tablet, Rfl: 3  Current Facility-Administered Medications:  .  sodium chloride flush (NS) 0.9 % injection 3 mL, 3 mL, Intravenous, Q12H, Lorretta Harp, MD:  Allergies  Allergen Reactions  . Adhesive [Tape] Other (See Comments)    Tears skin, Please use "paper" tape  . Amiodarone Other (See Comments)     Thyroid and liver and kidney problems  . Hydrocodone Other (See Comments)    HALLLUCINATIONS  . Other Other (See Comments)    NO BLOOD PRODUCTS -  Jehovah's Witness  . Reglan [Metoclopramide] Hives, Itching and Rash  . Remeron [Mirtazapine] Other (See Comments)    Hallucinations and makes the patient groggy   . Codeine Nausea And Vomiting  . Morphine And  Related Nausea And Vomiting  . Requip [Ropinirole Hcl] Other (See Comments)    Headaches   . Zinc Nausea Only  :  Family History  Problem Relation Age of Onset  . Colon cancer Mother 74  . Heart disease Mother   . Heart disease Father   . Heart attack Father   . Heart disease Maternal Grandmother   . Stomach cancer Brother   . Esophageal cancer Neg Hx   . Rectal cancer Neg Hx   :  Social History   Socioeconomic History  . Marital status: Widowed    Spouse name: Not on file  . Number of children: 3  . Years of education: Not on file  . Highest education level: Not on file  Occupational History  . Not on file  Tobacco Use  . Smoking status: Never Smoker  . Smokeless tobacco: Never Used  Vaping Use  . Vaping Use: Never used  Substance and Sexual Activity  . Alcohol use: No  . Drug use: No  . Sexual activity: Not Currently    Comment: Hysterectomy  Other Topics Concern  . Not on file  Social History Narrative   01/01/19 Lives in [place] w/dgtr, [name].  Continues to work.   Social Determinants of Health   Financial Resource Strain: Not on file  Food Insecurity: Not on file  Transportation Needs: Not on file  Physical Activity: Not on file  Stress: Not on file  Social Connections: Not on file  Intimate Partner Violence: Not on file  :  Pertinent items are noted in HPI.  Exam: Blood pressure 138/88, pulse 86, temperature 97.8 F (36.6 C), temperature source Tympanic, resp.  rate 15, height 4\' 11"  (1.499 m), weight 93 lb 11.2 oz (42.5 kg), SpO2 98 %.  ECOG 2   General appearance: alert and cooperative appeared without distress.  Overall frail. Head: atraumatic without any abnormalities. Eyes: conjunctivae/corneas clear. PERRL.  Sclera anicteric. Throat: lips, mucosa, and tongue normal; without oral thrush or ulcers. Resp: clear to auscultation bilaterally without rhonchi, wheezes or dullness to percussion. Cardio: regular rate and rhythm, S1, S2  normal, no murmur, click, rub or gallop GI: soft, non-tender; bowel sounds normal; no masses,  no organomegaly Skin: Skin color, texture, turgor normal. No rashes or lesions Lymph nodes: Cervical, supraclavicular, and axillary nodes normal. Neurologic: Grossly normal without any motor, sensory or deep tendon reflexes. Musculoskeletal: No joint deformity or effusion.  Recent Labs    12/23/20 1001  WBC 5.4  HGB 11.9  HCT 37.5  PLT 238   Recent Labs    12/23/20 1001  NA 140  K 4.2  CL 106  CO2 25  GLUCOSE 101*  BUN 32*  CREATININE 1.21*  CALCIUM 8.7        IR THORACENTESIS ASP PLEURAL SPACE W/IMG GUIDE  Result Date: 12/17/2020 INDICATION: Patient with history of congestive heart failure, new right pleural effusion. Request for diagnostic and therapeutic right thoracentesis. EXAM: ULTRASOUND GUIDED DIAGNOSTIC AND THERAPEUTIC RIGHT THORACENTESIS MEDICATIONS: 10 mL 1% lidocaine COMPLICATIONS: None immediate. PROCEDURE: An ultrasound guided thoracentesis was thoroughly discussed with the patient and questions answered. The benefits, risks, alternatives and complications were also discussed. The patient understands and wishes to proceed with the procedure. Written consent was obtained. Ultrasound was performed to localize and mark an adequate pocket of fluid in the right chest. The area was then prepped and draped in the normal sterile fashion. 1% Lidocaine was used for local anesthesia. Under ultrasound guidance a 6 Fr Safe-T-Centesis catheter was introduced. Thoracentesis was performed. The catheter was removed and a dressing applied. FINDINGS: A total of approximately 800 mL of cloudy, yellow fluid was removed. Samples were sent to the laboratory as requested by the clinical team. IMPRESSION: Successful ultrasound guided diagnostic and therapeutic right thoracentesis yielding 800 mL of pleural fluid. Read by: Brynda Greathouse PA-C Electronically Signed   By: Corrie Mckusick D.O.   On:  12/17/2020 11:18    Assessment and Plan:   87 year old woman with:  1.  Malignant pleural effusion detected on December 17, 2020.  She has bilateral effusion with right thoracentesis showed a malignant carcinoma with immunohistochemical staining consistent with urothelial carcinoma.  The natural course of these findings and management options were discussed at this time.  If her step is to complete her staging scans including CT scan chest abdomen pelvis to identify the nature of her disease and the extent of her malignancy.  Differential diagnosis was also discussed at this time and pending the results of her staging scans this can be refined.  Metastatic urothelial carcinoma is the most likely etiology at this time although other malignancy could be considered.  Treatment options at this time which will be palliative at best to include immunotherapy, chemotherapy as well as antibiotic drug conjugate.  Given her overall health status he would be a marginal candidate for systemic therapy and immunotherapy remain her best option.  Complication associated with Pembrolizumab were discussed.  He is to include nausea, fatigue, skin rash among others.  After discussion today, we will obtain staging CT scan of the chest abdomen and pelvis to be completed in the near future.  Depending on the  extent of the disease, we will decide whether immunotherapy versus hospice is her best option.   2.  IV access: Risks and benefits of a Port-A-Cath insertion versus peripheral veins were discussed in case she is considering systemic therapy.   3.  Antiemetics: Prescription for appropriate antiemetics will be available to her.  4.  Pleural effusion reaccumulation: She might require repeat thoracentesis possible Pleurx catheter.  5.  Prognosis and goals of care: Any therapy is palliative at this time and her prognosis is poor given her age, comorbidities and advanced malignancy.  This was discussed today in detail  with the patient and her daughter.  6.  Follow-up: We will be in the next few weeks to follow her progress.   60  minutes were dedicated to this visit. The time was spent on reviewing laboratory data, imaging studies, discussing treatment options, discussing differential diagnosis and answering questions regarding future plan.      A copy of this consult has been forwarded to the requesting physician.

## 2020-12-24 NOTE — Telephone Encounter (Signed)
This is Dr. Berry's pt 

## 2020-12-24 NOTE — Progress Notes (Signed)
============================================================ -   Test results slightly outside the reference range are not unusual. If there is anything important, I will review this with you,  otherwise it is considered normal test values.  If you have further questions,  please do not hesitate to contact me at the office or via My Chart.  ============================================================ ============================================================  -  CBC shows mild chronic Anemia  but Red Cell Count /Hgb  is better                                                               - up slightly to now   11.9 gm%   - Great - plts- OK ============================================================ ============================================================  - CMET show sl low calcium consistent with low albumin /blood proteins.    - Kidney functions still look dehydrated with elevated BUN and Creatinine  - and GFR flow rate is down to 41 (from GFR -51   3 weeks and a year ago)   - This is consistent with Dehydration not heart failure or "fluid overload", So. . .   it's very important to drink  more water /fluids   - For your size, Recommend drink at least 4 bottles (16 oz) of water /fluids /day          ( 4 x 16 = at least 64 ounces /day !                                             - that's 4 pints /day                                                       or 2 quarts/day                                                                          or 1/2 gallon /day ) =========================================================== - So get busy with the water  /fluid intake !  )

## 2020-12-26 ENCOUNTER — Other Ambulatory Visit (HOSPITAL_COMMUNITY): Payer: Self-pay

## 2020-12-26 ENCOUNTER — Other Ambulatory Visit: Payer: Self-pay | Admitting: *Deleted

## 2020-12-26 MED ORDER — PANTOPRAZOLE SODIUM 40 MG PO TBEC: DELAYED_RELEASE_TABLET | ORAL | 2 refills | Status: DC

## 2020-12-30 DIAGNOSIS — N179 Acute kidney failure, unspecified: Secondary | ICD-10-CM | POA: Diagnosis not present

## 2020-12-30 DIAGNOSIS — I5033 Acute on chronic diastolic (congestive) heart failure: Secondary | ICD-10-CM | POA: Diagnosis not present

## 2020-12-30 DIAGNOSIS — I495 Sick sinus syndrome: Secondary | ICD-10-CM | POA: Diagnosis not present

## 2020-12-30 DIAGNOSIS — N1832 Chronic kidney disease, stage 3b: Secondary | ICD-10-CM | POA: Diagnosis not present

## 2020-12-30 DIAGNOSIS — I739 Peripheral vascular disease, unspecified: Secondary | ICD-10-CM | POA: Diagnosis not present

## 2020-12-31 DIAGNOSIS — I5033 Acute on chronic diastolic (congestive) heart failure: Secondary | ICD-10-CM | POA: Diagnosis not present

## 2020-12-31 DIAGNOSIS — N1832 Chronic kidney disease, stage 3b: Secondary | ICD-10-CM | POA: Diagnosis not present

## 2020-12-31 DIAGNOSIS — N179 Acute kidney failure, unspecified: Secondary | ICD-10-CM | POA: Diagnosis not present

## 2020-12-31 DIAGNOSIS — I739 Peripheral vascular disease, unspecified: Secondary | ICD-10-CM | POA: Diagnosis not present

## 2020-12-31 DIAGNOSIS — I495 Sick sinus syndrome: Secondary | ICD-10-CM | POA: Diagnosis not present

## 2021-01-02 ENCOUNTER — Ambulatory Visit (HOSPITAL_COMMUNITY)
Admission: RE | Admit: 2021-01-02 | Discharge: 2021-01-02 | Disposition: A | Discharge status: Home / Self Care | Payer: Medicare Other | Source: Ambulatory Visit | Attending: Oncology | Admitting: Oncology

## 2021-01-02 ENCOUNTER — Other Ambulatory Visit: Payer: Self-pay

## 2021-01-02 DIAGNOSIS — K838 Other specified diseases of biliary tract: Secondary | ICD-10-CM | POA: Diagnosis not present

## 2021-01-02 DIAGNOSIS — K8689 Other specified diseases of pancreas: Secondary | ICD-10-CM | POA: Diagnosis not present

## 2021-01-02 DIAGNOSIS — C641 Malignant neoplasm of right kidney, except renal pelvis: Secondary | ICD-10-CM | POA: Diagnosis not present

## 2021-01-02 DIAGNOSIS — C787 Secondary malignant neoplasm of liver and intrahepatic bile duct: Secondary | ICD-10-CM | POA: Diagnosis not present

## 2021-01-02 DIAGNOSIS — J984 Other disorders of lung: Secondary | ICD-10-CM | POA: Diagnosis not present

## 2021-01-02 DIAGNOSIS — C651 Malignant neoplasm of right renal pelvis: Secondary | ICD-10-CM | POA: Diagnosis not present

## 2021-01-02 DIAGNOSIS — Z85528 Personal history of other malignant neoplasm of kidney: Secondary | ICD-10-CM | POA: Diagnosis not present

## 2021-01-02 DIAGNOSIS — I2510 Atherosclerotic heart disease of native coronary artery without angina pectoris: Secondary | ICD-10-CM | POA: Diagnosis not present

## 2021-01-05 ENCOUNTER — Telehealth: Payer: Self-pay | Admitting: Internal Medicine

## 2021-01-05 NOTE — Telephone Encounter (Signed)
Patients daughter [name] called to speak with a nurse said her mother has developed a rash and she is thinking its from a medication not sure which one.

## 2021-01-05 NOTE — Telephone Encounter (Signed)
This is a nurse triage call.I did try returning call to pt's daughter. No answer. I forwarded message to Dr. Carlean Purl in case he receives call before we make contact with pt or her daug.

## 2021-01-05 NOTE — Telephone Encounter (Signed)
Brett Fairy this was left in PJ box. I did try returning pt call. I forwarded to Dr.Gessner as well.

## 2021-01-06 DIAGNOSIS — I5033 Acute on chronic diastolic (congestive) heart failure: Secondary | ICD-10-CM | POA: Diagnosis not present

## 2021-01-06 DIAGNOSIS — I495 Sick sinus syndrome: Secondary | ICD-10-CM | POA: Diagnosis not present

## 2021-01-06 DIAGNOSIS — I739 Peripheral vascular disease, unspecified: Secondary | ICD-10-CM | POA: Diagnosis not present

## 2021-01-06 DIAGNOSIS — N179 Acute kidney failure, unspecified: Secondary | ICD-10-CM | POA: Diagnosis not present

## 2021-01-06 DIAGNOSIS — N1832 Chronic kidney disease, stage 3b: Secondary | ICD-10-CM | POA: Diagnosis not present

## 2021-01-08 NOTE — Telephone Encounter (Signed)
Spoke to daughter and patient.  She has been diagnosed with "lupus of the skin" and pantoprazole seem to be making that worse.  Her primary care provider changed her to famotidine 40 mg daily instead.  I agree with that.  Stay off the pantoprazole.  Unfortunately she had a malignant pleural effusion and is getting back with oncology about this, she was hospitalized in late April.  I will see her later this month.

## 2021-01-09 DIAGNOSIS — L959 Vasculitis limited to the skin, unspecified: Secondary | ICD-10-CM | POA: Diagnosis not present

## 2021-01-09 DIAGNOSIS — D485 Neoplasm of uncertain behavior of skin: Secondary | ICD-10-CM | POA: Diagnosis not present

## 2021-01-09 DIAGNOSIS — L309 Dermatitis, unspecified: Secondary | ICD-10-CM | POA: Diagnosis not present

## 2021-01-12 ENCOUNTER — Inpatient Hospital Stay: Payer: Medicare Other | Attending: Oncology | Admitting: Oncology

## 2021-01-12 DIAGNOSIS — C641 Malignant neoplasm of right kidney, except renal pelvis: Secondary | ICD-10-CM | POA: Diagnosis not present

## 2021-01-12 NOTE — Progress Notes (Signed)
Cardiology Office Note:    Date:  01/13/2021   ID:  [name], DOB February 21, 1934, MRN [id number]  PCP:  [name], MD Referring MD: [name], [facility]  Cardiologist:  [name], MD   Reason for visit: F/U hospitalization   History of Present Illness:    [name] is a 87 y.o. female with a hx of chronic diastolic CHF, paroxysmal atrial fibrillation s/p DCCV 12/05/19 & 06/13/20, tachy-brady syndrome s/p PPM, nonobstructive CAD (LHC 10/2019), s/p TAVR 10/2019, PAD s/p orbital atherectomy, PTA stenting of distal R external iliac on 11/27/2020.  She had a 99% subtotally occluded calcified distal right external iliac artery stenosis. HTN, HLD, left central retinal artery occlusion from cholesterol plaque, stomach cancer, solitary kidney with CKD stage 3, and anemia (Jehovah's witness).  In December 2021, pt was in rate controlled A-fib, Dr. Rayann Heman felt that there was no good option for her to return to/maintain NSR, therefore he recommended rate control.  At her last visit with Dr. Bonner Puna in 11/2020, he noted that she had some SOB almost daily with activity but is able to do her housework.   She has stage IV bladder cancer documented in April 2022.  She presented with pleural effusion and imaging studies showed widespread disease including adenopathy and hepatic metastasis.   She has a hx of bladder cancer diagnosed in 2019.  She saw heme/onc on 01/12/21, after discussion, pt opted for supportive management only with prognosis <6 months.  Today, she comes in with her daughter.  She states she feels well overall despite her prognosis.  She has a very strong family hx of cancer.  They are talking to hospice this week.  She denies chest pain, shortness of breath, syncope or PND.  Her bed is elevated and she has some back discomfort.  She has not had palpitations recently.  Her dry weight is ~93-94 lbs.  At home today, she was 96 lbs.  She has mild R  ankle puffiness.  She denies recurrent bleeding issues on Plavix.  On her 4/13 CXR, she had residual small-mod R pleural effusion post tap.  She states her R leg heaviness and buckling improved post R leg revascularization with Dr. Gwenlyn Found.   Past Medical History:  Diagnosis Date  . Adenocarcinoma of stomach ([place]) 11/11/2015   gastric mass on egd,   . Anal fissure   . Anemia    hx 3/17 iron infusion, on aranesp and injected B12 since 11/2015.   [name] Anxiety   . Aortic root dilatation ([place])   . Arthritis    oa  . Bladder cancer ([place])   . Blind left eye 2015   partial blind in left eye due to stroke   . 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 CAD (coronary artery disease)   . Cataracts, bilateral   . Chronic kidney disease    only has one kidney rt lft rem 03 ca     dr. Risa Grill, bladder cancer 05-04-2018  . Elevated LFTs    hepatic steatosis, none recent  . Endometrial cancer ([place])   . Gastroparesis   . GERD (gastroesophageal reflux disease)   . Hearing loss    wears hearing aids  . Heart murmur   . History of uterine cancer 1980s   treated with hysterectomy, external radiation and radiation seed implants.   [name] HTN (hypertension)   . Hyperlipemia   . Hypothyroidism   . Iron deficiency anemia due to  chronic blood loss 11/24/2015  . Neuropathy    right leg  . Osteomyelitis ([place]) as child  . PAF (paroxysmal atrial fibrillation) ([place])   . Presence of permanent cardiac pacemaker   . Refusal of blood transfusions as patient is Jehovah's Witness   . Restless leg syndrome   . 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 Severe aortic stenosis   . Stroke [facility]) 2015   partial stroke left legally blind; left eye   . Tachycardia-bradycardia syndrome ([place])    s/p PPM by Dr Doreatha Lew (MDT) 07/03/10  . 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 Walker as ambulation aid   . Wears glasses    . Wears partial dentures    upper    Past Surgical History:  Procedure Laterality Date  . ABDOMINAL AORTOGRAM W/LOWER EXTREMITY Bilateral 11/27/2020   Procedure: ABDOMINAL AORTOGRAM W/LOWER EXTREMITY;  Surgeon: Lorretta Harp, MD;  Location: [place] [facility];  Service: Cardiovascular;  Laterality: Bilateral;  limited to iliacs  . CARDIAC CATHETERIZATION  2008  . CARDIOVERSION N/A 12/05/2019   Procedure: CARDIOVERSION;  Surgeon: Elouise Munroe, MD;  Location: [facility] ENDOSCOPY;  Service: Cardiovascular;  Laterality: N/A;  . CARDIOVERSION N/A 06/13/2020   Procedure: CARDIOVERSION;  Surgeon: Buford Dresser, MD;  Location: [facility] ENDOSCOPY;  Service: Cardiovascular;  Laterality: N/A;  . CHOLECYSTECTOMY  1970s  . COLONOSCOPY N/A 11/11/2015   Procedure: COLONOSCOPY;  Surgeon: [name], MD;  [facility]: [facility] ENDOSCOPY;  Service: Endoscopy;  Laterality: N/A;  . CYSTOSCOPY W/ RETROGRADES Right 05/01/2018   Procedure: CYSTOSCOPY WITH RIGHT RETROGRADE PYELOGRAM;  Surgeon: Lucas Mallow, MD;  Location: [facility];  Service: Urology;  Laterality: Right;  . CYSTOSCOPY W/ RETROGRADES Right 07/21/2018   Procedure: CYSTOSCOPY WITH RIGHT RETROGRADE PYELOGRAM;  Surgeon: Alexis Frock, MD;  [facility];  Service: Urology;  Laterality: Right;  . ESOPHAGOGASTRODUODENOSCOPY N/A 11/11/2015   Procedure: ESOPHAGOGASTRODUODENOSCOPY (EGD);  Surgeon: [name], MD;  Location: [facility];  Service: Endoscopy;  Laterality: N/A;  . ESOPHAGOGASTRODUODENOSCOPY N/A 02/05/2016   Procedure: ESOPHAGOGASTRODUODENOSCOPY (EGD);  Surgeon: Ladene Artist, MD;  Location: [facility] ENDOSCOPY;  Service: Endoscopy;  Laterality: N/A;  . ESOPHAGOGASTRODUODENOSCOPY N/A 04/02/2016   Procedure: ESOPHAGOGASTRODUODENOSCOPY (EGD);  Surgeon: [name], MD;  Location: [facility];  Service: Endoscopy;  Laterality: N/A;  . ESOPHAGOGASTRODUODENOSCOPY (EGD) WITH PROPOFOL N/A 04/06/2017   Procedure: ESOPHAGOGASTRODUODENOSCOPY (EGD)  WITH PROPOFOL;  Surgeon: [name], MD;  [facility];  Service: Endoscopy;  Laterality: N/A;  . EYE SURGERY Bilateral    remove cataracts  . GASTRECTOMY N/A 01/15/2016   Procedure: OPEN PARTIAL GASTRECTOMY;  Surgeon: Stark Klein, MD;  Location: [place];  Service: General;  Laterality: N/A;  . GASTRECTOMY Left 02/24/2016   Procedure: OPEN J TUBE;  Surgeon: Stark Klein, MD;  Location: [place];  Service: General;  Laterality: Left;  [name] GASTROJEJUNOSTOMY N/A 05/18/2016   Procedure: ROUX EN [place];  Surgeon: Stark Klein, MD;  Location: [place];  Service: General;  Laterality: N/A;  . I & D KNEE WITH POLY EXCHANGE Right 12/17/2013   Procedure: IRRIGATION AND DEBRIDEMEN RIGHT TOTAL  KNEE WITH POLY EXCHANGE;  Surgeon: Mauri Pole, MD;  [facility];  Service: Orthopedics;  Laterality: Right;  . IR GENERIC HISTORICAL  06/17/2016   IR RADIOLOGIST EVAL & MGMT 06/17/2016 Ascencion Dike, PA-C [facility] INTERV RAD  .  IR THORACENTESIS ASP PLEURAL SPACE W/IMG GUIDE  12/17/2020  . LAPAROSCOPY N/A 01/15/2016   Procedure: LAPAROSCOPY DIAGNOSTIC;  Surgeon: Stark Klein, MD;  Location: [place];  Service: General;  Laterality: N/A;  . LYSIS OF ADHESION  05/18/2016   Procedure: LYSIS OF ADHESION;  Surgeon: Stark Klein, MD;  Location: [place];  Service: General;;  . NEPHRECTOMY Left    renal cell cancer  . PACEMAKER INSERTION  07/04/2011   MDT PPM implanted by Dr Doreatha Lew for tachy/brady; managed by Dr Thompson Grayer   . PATELLAR TENDON REPAIR Right 03/06/2014   Procedure: AVULSION WITH PATELLA TENDON REPAIR;  Surgeon: Mauri Pole, MD;  [facility];  Service: Orthopedics;  Laterality: Right;  . PATENT DUCTUS ARTERIOUS REPAIR  ~ 1949  . PERIPHERAL VASCULAR ATHERECTOMY Right 11/27/2020   Procedure: PERIPHERAL VASCULAR ATHERECTOMY;  Surgeon: Lorretta Harp, MD;  Location: [place] [facility];  Service: Cardiovascular;  Laterality: Right;  ext iliac  . PERIPHERAL VASCULAR INTERVENTION Right  11/27/2020   Procedure: PERIPHERAL VASCULAR INTERVENTION;  Surgeon: Lorretta Harp, MD;  Location: [place] [facility];  Service: Cardiovascular;  Laterality: Right;  ext iliac  . RIGHT/LEFT HEART CATH AND CORONARY ANGIOGRAPHY N/A 10/09/2019   Procedure: RIGHT/LEFT HEART CATH AND CORONARY ANGIOGRAPHY;  Surgeon: [name], [name], MD;  Location: [place] [facility];  Service: Cardiovascular;  Laterality: N/A;  . TEE WITHOUT CARDIOVERSION N/A 10/30/2019   Procedure: TRANSESOPHAGEAL ECHOCARDIOGRAM (TEE);  Surgeon: Burnell Blanks, MD;  Location: [place];  Service: Open Heart Surgery;  Laterality: N/A;  . TEE WITHOUT CARDIOVERSION N/A 06/13/2020   Procedure: TRANSESOPHAGEAL ECHOCARDIOGRAM (TEE);  Surgeon: Buford Dresser, MD;  Location: [facility] ENDOSCOPY;  Service: Cardiovascular;  Laterality: N/A;  . TOTAL ABDOMINAL HYSTERECTOMY  85  . TOTAL HIP REVISION Right 2009   initial replacment surg  . TOTAL KNEE ARTHROPLASTY  02/07/2012   Procedure: TOTAL KNEE ARTHROPLASTY;  Surgeon: Mauri Pole, MD;  [facility];  Service: Orthopedics;  Laterality: Right;  . TOTAL KNEE REVISION Right 07/29/2014   Procedure: RIGHT KNEE REVISION OF PREVIOUS REPAIR EXTENSOR MECHANISM;  Surgeon: Mauri Pole, MD;  [facility];  Service: Orthopedics;  Laterality: Right;  . TRANSCATHETER AORTIC VALVE REPLACEMENT, CAROTID Left 10/30/2019   TRANSCATHETER AORTIC VALVE REPLACEMENT, LEFT CAROTID (Left Neck)   . TRANSCATHETER AORTIC VALVE REPLACEMENT, CAROTID Left 10/30/2019   Procedure: TRANSCATHETER AORTIC VALVE REPLACEMENT, LEFT CAROTID;  Surgeon: Burnell Blanks, MD;  Location: [place];  Service: Open Heart Surgery;  Laterality: Left;  . TRANSURETHRAL RESECTION OF BLADDER TUMOR N/A 07/21/2018   Procedure: TRANSURETHRAL RESECTION OF BLADDER TUMOR (TURBT);  Surgeon: Alexis Frock, MD;  [facility];  Service: Urology;  Laterality: N/A;  . TRANSURETHRAL RESECTION OF BLADDER TUMOR WITH MITOMYCIN-C N/A  05/01/2018   Procedure: TRANSURETHRAL RESECTION OF BLADDER TUMOR WITH POST OP INSTILLATION OF GEMCITABINE;  Surgeon: Lucas Mallow, MD;  [facility];  Service: Urology;  Laterality: N/A;  . WHIPPLE PROCEDURE N/A 05/18/2016   Procedure: EXPLORATORY LAPAROTOMY;  Surgeon: Stark Klein, MD;  Location: [place];  Service: General;  Laterality: N/A;    Current Medications: Current Meds  Medication Sig  . acetaminophen (TYLENOL) 500 MG tablet Take 500 mg by mouth every 8 (eight) hours as needed for moderate pain or mild pain (or headaches).  . ALPRAZolam (XANAX) 0.25 MG tablet Take 0.5-1 tablets (0.125-0.25 mg total) by mouth 2 (two) times daily as needed for anxiety.  . Alum & Mag  Hydroxide-Simeth (MYLANTA PO) Take 20 mLs by mouth at bedtime.  . Ascorbic Acid (VITAMIN C) 1000 MG tablet Take 1,000 mg by mouth daily.  . Cholecalciferol (VITAMIN D-3) 5000 units TABS Take 5,000 Units by mouth 3 (three) times daily.   . clobetasol cream (TEMOVATE) 0.05 % SMARTSIG:2 Topical Twice Daily  . clopidogrel (PLAVIX) 75 MG tablet Take 1 tablet (75 mg total) by mouth daily with breakfast.  . Cyanocobalamin 5000 MCG SUBL Place 5,000 mcg under the tongue daily.   [name] erythromycin ophthalmic ointment Place 1 application into both eyes at bedtime as needed (for styes).  . famotidine (PEPCID) 40 MG tablet Take 1 tablet (40 mg total) by mouth daily.  . ferrous sulfate 325 (65 FE) MG tablet Take 325 mg by mouth daily with lunch.  . furosemide (LASIX) 20 MG tablet Take 1 tablet (20 mg total) by mouth daily. (Patient taking differently: Take 20 mg by mouth in the morning.)  . gabapentin (NEURONTIN) 300 MG capsule TAKE 1 CAPSULE BY MOUTH THREE TIMES A DAY (Patient taking differently: Take 300 mg by mouth See admin instructions. Take 300 mg by mouth in the morning and with lunch)  . hydrocortisone (ANUSOL-HC) 2.5 % rectal cream Place rectally 2 (two) times daily. (Patient taking differently: Place 1 application rectally 2  (two) times daily as needed for hemorrhoids.)  . levothyroxine (SYNTHROID) 100 MCG tablet TAKE 1/2 TABLET DAILY FOR THYROID. (Patient taking differently: Take 50 mcg by mouth daily before breakfast.)  . loperamide (IMODIUM A-D) 2 MG tablet Take up to 12 tablets /day as needed for  for Diarrhea (Patient taking differently: Take 2 mg by mouth at bedtime.)  . loratadine (CLARITIN) 10 MG tablet Take 10 mg by mouth daily with supper.  . losartan (COZAAR) 50 MG tablet Take 0.5 tablets (25 mg total) by mouth daily.  . metoprolol succinate (TOPROL-XL) 50 MG 24 hr tablet Take 1 tablet (50 mg total) by mouth daily.  . ondansetron (ZOFRAN-ODT) 8 MG disintegrating tablet Dissolve 1 tablet under tongue every 6 to 8 hours  3 x /day if needed for Nausea or Vomitting (Patient taking differently: Take 8 mg by mouth See admin instructions. Dissolve 8 mg under the tongue every 6-8 hours as needed for nausea or vomiting)  . potassium chloride (KLOR-CON) 10 MEQ tablet Take 10 mEq by mouth in the morning.  . potassium chloride (KLOR-CON) 10 MEQ tablet TAKE 1 TABLET BY MOUTH EVERY DAY  . pravastatin (PRAVACHOL) 20 MG tablet Take 1 tablet (20 mg total) by mouth daily. (Patient taking differently: Take 20 mg by mouth every evening.)  . Propylene Glycol (SYSTANE COMPLETE) 0.6 % SOLN Place 1 drop into both eyes 3 (three) times daily as needed (irritation/dry eyes.).  [name] simethicone (MYLICON) 80 MG chewable tablet Chew 80 mg by mouth every 6 (six) hours as needed for flatulence.  . sucralfate (CARAFATE) 1 g tablet TAKE 1 TABLET (1 G TOTAL) BY MOUTH 4 (FOUR) TIMES DAILY - WITH MEALS AND AT BEDTIME.  [name] tobramycin-dexamethasone (TOBRADEX) ophthalmic solution Place 1 drop into both eyes daily as needed (for styes).  . triamcinolone cream (KENALOG) 0.1 % Apply 1 application topically 2 (two) times daily as needed (rash/skin irritation.).   [name] verapamil (CALAN-SR) 120 MG CR tablet TAKE 1 TABLET 2 X /DAY WITH A MEAL FOR BP & HEART RHYTHM  (Patient taking differently: Take 120 mg by mouth 2 (two) times daily.)     Allergies:   Adhesive [tape], Amiodarone, Hydrocodone, Other, Reglan [  metoclopramide], Remeron [mirtazapine], Codeine, Morphine and related, Requip [ropinirole hcl], and Zinc   Social History   Socioeconomic History  . Marital status: Widowed    Spouse name: Not on file  . Number of children: 3  . Years of education: Not on file  . Highest education level: Not on file  Occupational History  . Not on file  Tobacco Use  . Smoking status: Never Smoker  . Smokeless tobacco: Never Used  Vaping Use  . Vaping Use: Never used  Substance and Sexual Activity  . Alcohol use: No  . Drug use: No  . Sexual activity: Not Currently    Comment: Hysterectomy  Other Topics Concern  . Not on file  Social History Narrative   01/01/19 Lives in [place] w/dgtr, [name].  Continues to work.   Social Determinants of Health   Financial Resource Strain: Not on file  Food Insecurity: Not on file  Transportation Needs: Not on file  Physical Activity: Not on file  Stress: Not on file  Social Connections: Not on file     Family History: The patient's family history includes Colon cancer (age of onset: 39) in her mother; Heart attack in her father; Heart disease in her father, maternal grandmother, and mother; Stomach cancer in her brother. There is no history of Esophageal cancer or Rectal cancer.  ROS:   Please see the history of present illness.     EKGs/Labs/Other Studies Reviewed:    EKG:  The ekg ordered today demonstrates A-paced, rate 63.    Recent Labs: 08/18/2020: TSH 1.27 12/18/2020: Magnesium 1.8 12/19/2020: B Natriuretic Peptide 336.3 12/23/2020: ALT 6; BUN 32; Creat 1.21; Hemoglobin 11.9; Platelets 238; Potassium 4.2; Sodium 140  Recent Lipid Panel    Component Value Date/Time   CHOL 131 08/18/2020 1117   TRIG 107 08/18/2020 1117   HDL 41 (L) 08/18/2020 1117   CHOLHDL 3.2 08/18/2020 1117   VLDL  29 03/23/2017 1445   LDLCALC 71 08/18/2020 1117    Physical Exam:    VS:  BP 130/60   Pulse 63   Ht 4\' 11"  (1.499 m)   Wt 98 lb 3.2 oz (44.5 kg)   LMP  (LMP Unknown)   SpO2 95%   BMI 19.83 kg/m     Wt Readings from Last 3 Encounters:  01/13/21 98 lb 3.2 oz (44.5 kg)  12/24/20 93 lb 11.2 oz (42.5 kg)  12/23/20 93 lb 9.6 oz (42.5 kg)     GEN:  Elderly, well nourished, no acute distress NECK: No JVD CARDIAC: RRR, II/VI systolic murmur  RESPIRATORY:  Decrease breath sounds to the R mid lung.  Left lung is clear. ABDOMEN: Soft, non-tender, non-distended MUSCULOSKELETAL: trace R ankle edema, no L leg edema; No deformity  SKIN: Warm and dry NEUROLOGIC:  Alert and oriented PSYCHIATRIC:  Normal affect   ASSESSMENT AND PLAN   1. Chronic diastolic CHF, minimally hypervolemic.   - secondary to combined Afib, hypertensive heart disease and AS.  - Recommend continuing on lasix 20 mg daily.  She takes an extra lasix when she notices weight gain (typically her first indication of volume overload.)    2. Paroxysmal atrial fibrillation, currently A-paced - S/p DCCV in 11/2019 & 06/13/20, per Dr. Rayann Heman in 08/2020, will continue rate control strategy.   - [place] Vasc score of 7.  We discussed her stroke risk.  With poor prognosis <6 months, hospice enrollment & recent anemia/melena in 11/2020, she does not want to restart coumadin.       -  Continue metoprolol & verapamil.    3. Aortic stenosis. S/p TAVR in February 2021 - mild to moderate perivalvular lead/AI on 2D echo 10/2020  4. HTN,  well controlled.  - Continue metoprolol, verapamil, losartan.  5.Tachy-brady syndrome s/p PPM  6. Carotid artery disease - mild stenosis noted on carotid dopplers 08/15/2019  7. PAD s/p stenting of the right external iliac artery 11/2020 - Given prognosis, it is ok to stop as this is a preventive medication.  - Discussed with Dr. Gwenlyn Found -- okay to stop Plavix 3 months post procedure (03/06/21).     ---> Advised her to follow-up as needed given hospice enrollment for stage IV metatastic bladder cancer.  Medication management per hospice physician/ for symptomatic relief.   Medication Adjustments/Labs and Tests Ordered: Current medicines are reviewed at length with the patient today.  Concerns regarding medicines are outlined above.  Orders Placed This Encounter  Procedures  . EKG 12-Lead   No orders of the defined types were placed in this encounter.   Patient Instructions  Medication Instructions:  Stop Plavix March 06, 2021 *If you need a refill on your cardiac medications before your next appointment, please call your pharmacy*   Lab Work: No Labs If you have labs (blood work) drawn today and your tests are completely normal, you will receive your results only by: [name] MyChart Message (if you have MyChart) OR . A paper copy in the mail If you have any lab test that is abnormal or we need to change your treatment, we will call you to review the results.   Testing/Procedures: No Testing   Follow-Up: At [facility], you and your health needs are our priority.  As part of our continuing mission to provide you with exceptional heart care, we have created designated Provider Care Teams.  These Care Teams include your primary Cardiologist (physician) and Advanced Practice Providers (APPs -  Physician Assistants and Nurse Practitioners) who all work together to provide you with the care you need, when you need it.  Your next appointment:   Follow up as Needed  The format for your next appointment:   In Person  Provider:   [name], MD       Signed, Warren Lacy, PA-C  01/13/2021 10:09 AM    [place]

## 2021-01-12 NOTE — Progress Notes (Signed)
Hematology and Oncology Follow Up for Telemedicine Visits  [name]:[id number] 19-Aug-1934 87 y.o. 01/12/2021 8:05 AM [name], MDMcKeown, Gwyndolyn Saxon, MD   I connected with [name] on 01/12/21 at  9:30 AM EDT by telephone visit and verified that I am speaking with the correct person using two identifiers.   I discussed the limitations, risks, security and privacy concerns of performing an evaluation and management service by telemedicine and the availability of in-person appointments. I also discussed with the patient that there may be a patient responsible charge related to this service. The patient expressed understanding and agreed to proceed.  Other persons participating in the visit and their role in the encounter: [name]  Patient's location: Home Provider's location: Office    Principle Diagnosis: 87 year old woman with stage IV bladder cancer documented in April 2022.  She presented with pleural effusion with cytology consistent with malignancy.  She has a history of a localized bladder cancer 2019.   Prior Therapy:   She is status post thoracentesis in April 2022.  She is status post previous TURBT on multiple occasions.  Current therapy: Under consideration for further therapy.  Interim History: Ms. Berryman reports reports no major changes in her health.  It was noted that she has further decline in her health reported by her Valerie and also she has developed diffuse erythematous rash and currently under evaluation by her dermatologist.  She has prescribed cortisone cream but no oral prednisone.  Her performance status and quality of life continues to decline.    Medications: I have reviewed the patient's current medications.  Current Outpatient Medications  Medication Sig Dispense Refill  . acetaminophen (TYLENOL) 500 MG tablet Take 500 mg by mouth every 8 (eight) hours as needed for moderate pain or mild pain (or headaches).    . ALPRAZolam (XANAX)  0.25 MG tablet Take 0.5-1 tablets (0.125-0.25 mg total) by mouth 2 (two) times daily as needed for anxiety. 30 tablet 0  . Alum & Mag Hydroxide-Simeth (MYLANTA PO) Take 20 mLs by mouth at bedtime.    [name] amoxicillin (AMOXIL) 500 MG tablet Take 4 tablets (2,000 mg total) by mouth as directed. 1 hour prior to dental work including cleanings (Patient taking differently: Take 2,000 mg by mouth See admin instructions. Take 2,000 mg by mouth one hour prior to dental work, including cleanings) 12 tablet 12  . Ascorbic Acid (VITAMIN C) 1000 MG tablet Take 1,000 mg by mouth daily.    . Cholecalciferol (VITAMIN D-3) 5000 units TABS Take 5,000 Units by mouth 3 (three) times daily.     . clopidogrel (PLAVIX) 75 MG tablet Take 1 tablet (75 mg total) by mouth daily with breakfast. 30 tablet 6  . Cyanocobalamin 5000 MCG SUBL Place 5,000 mcg under the tongue daily.     [name] erythromycin ophthalmic ointment Place 1 application into both eyes at bedtime as needed (for styes).    . famotidine (PEPCID) 40 MG tablet Take 1 tablet (40 mg total) by mouth daily.    . ferrous sulfate 325 (65 FE) MG tablet Take 325 mg by mouth daily with lunch.    . furosemide (LASIX) 20 MG tablet Take 1 tablet (20 mg total) by mouth daily. (Patient taking differently: Take 20 mg by mouth in the morning.) 90 tablet 3  . gabapentin (NEURONTIN) 300 MG capsule TAKE 1 CAPSULE BY MOUTH THREE TIMES A DAY (Patient taking differently: Take 300 mg by mouth See admin instructions. Take 300 mg by mouth in  the morning and with lunch) 270 capsule 1  . hydrocortisone (ANUSOL-HC) 2.5 % rectal cream Place rectally 2 (two) times daily. (Patient taking differently: Place 1 application rectally 2 (two) times daily as needed for hemorrhoids.) 30 g 1  . levothyroxine (SYNTHROID) 100 MCG tablet TAKE 1/2 TABLET DAILY FOR THYROID. (Patient taking differently: Take 50 mcg by mouth daily before breakfast.) 45 tablet 1  . loperamide (IMODIUM A-D) 2 MG tablet Take up to 12  tablets /day as needed for  for Diarrhea (Patient taking differently: Take 2 mg by mouth at bedtime.) 120 tablet 11  . loratadine (CLARITIN) 10 MG tablet Take 10 mg by mouth daily with supper.    . losartan (COZAAR) 50 MG tablet Take 0.5 tablets (25 mg total) by mouth daily. 90 tablet 0  . metoprolol succinate (TOPROL-XL) 50 MG 24 hr tablet Take 1 tablet (50 mg total) by mouth daily. 30 tablet 0  . nitroGLYCERIN (NITROSTAT) 0.4 MG SL tablet Dissolve 1 tablet under tongue every 5 minutes if needed for Angina (Patient taking differently: Place 0.4 mg under the tongue every 5 (five) minutes as needed (FOR ANGINA).) 50 tablet prn  . ondansetron (ZOFRAN-ODT) 8 MG disintegrating tablet Dissolve 1 tablet under tongue every 6 to 8 hours  3 x /day if needed for Nausea or Vomitting (Patient taking differently: Take 8 mg by mouth See admin instructions. Dissolve 8 mg under the tongue every 6-8 hours as needed for nausea or vomiting) 30 tablet 3  . potassium chloride (KLOR-CON) 10 MEQ tablet Take 10 mEq by mouth in the morning.    . potassium chloride (KLOR-CON) 10 MEQ tablet TAKE 1 TABLET BY MOUTH EVERY DAY 90 tablet 3  . pravastatin (PRAVACHOL) 20 MG tablet Take 1 tablet (20 mg total) by mouth daily. (Patient taking differently: Take 20 mg by mouth every evening.) 90 tablet 1  . Propylene Glycol (SYSTANE COMPLETE) 0.6 % SOLN Place 1 drop into both eyes 3 (three) times daily as needed (irritation/dry eyes.).    [name] simethicone (MYLICON) 80 MG chewable tablet Chew 80 mg by mouth every 6 (six) hours as needed for flatulence.    . sucralfate (CARAFATE) 1 g tablet TAKE 1 TABLET (1 G TOTAL) BY MOUTH 4 (FOUR) TIMES DAILY - WITH MEALS AND AT BEDTIME. 360 tablet 3  . tobramycin-dexamethasone (TOBRADEX) ophthalmic solution Place 1 drop into both eyes daily as needed (for styes).    . triamcinolone cream (KENALOG) 0.1 % Apply 1 application topically 2 (two) times daily as needed (rash/skin irritation.).     [name] verapamil  (CALAN-SR) 120 MG CR tablet TAKE 1 TABLET 2 X /DAY WITH A MEAL FOR BP & HEART RHYTHM (Patient taking differently: Take 120 mg by mouth 2 (two) times daily.) 180 tablet 3   No current facility-administered medications for this visit.     Allergies:  Allergies  Allergen Reactions  . Adhesive [Tape] Other (See Comments)    Tears skin, Please use "paper" tape  . Amiodarone Other (See Comments)     Thyroid and liver and kidney problems  . Hydrocodone Other (See Comments)    HALLLUCINATIONS  . Other Other (See Comments)    NO BLOOD PRODUCTS -  [employer]  . Reglan [Metoclopramide] Hives, Itching and Rash  . Remeron [Mirtazapine] Other (See Comments)    Hallucinations and makes the patient groggy   . Codeine Nausea And Vomiting  . Morphine And Related Nausea And Vomiting  . Requip [Ropinirole Hcl] Other (See Comments)  Headaches   . Zinc Nausea Only       Lab Results: Lab Results  Component Value Date   WBC 5.4 12/23/2020   HGB 11.9 12/23/2020   HCT 37.5 12/23/2020   MCV 100.0 12/23/2020   PLT 238 12/23/2020     Chemistry      Component Value Date/Time   NA 140 12/23/2020 1001   NA 145 (H) 11/25/2020 1122   NA 143 12/08/2015 1209   K 4.2 12/23/2020 1001   K 4.2 12/08/2015 1209   CL 106 12/23/2020 1001   CO2 25 12/23/2020 1001   CO2 25 12/08/2015 1209   BUN 32 (H) 12/23/2020 1001   BUN 13 11/25/2020 1122   BUN 25.3 12/08/2015 1209   CREATININE 1.21 (H) 12/23/2020 1001   CREATININE 1.1 12/08/2015 1209      Component Value Date/Time   CALCIUM 8.7 12/23/2020 1001   CALCIUM 9.2 12/08/2015 1209   ALKPHOS 86 12/16/2020 1311   ALKPHOS 104 12/08/2015 1209   AST 15 12/23/2020 1001   AST 16 12/08/2015 1209   ALT 6 12/23/2020 1001   ALT 10 12/08/2015 1209   BILITOT 0.5 12/23/2020 1001   BILITOT <0.30 12/08/2015 1209       Radiological Studies: IMPRESSION: 1. Worsening of disease with bulky adenopathy in the chest associated with small pulmonary nodules  and bulky adenopathy with potential pancreatic mass in the upper abdomen and retroperitoneum. 2. Hepatic metastatic disease new from previous imaging. 3. Nodular metastasis suspected at the LEFT lung base amidst airspace disease. 4. Large RIGHT-sided pleural effusion increased from the prior study with decreased pleural fluid in the LEFT chest. 5. of body wall edema and pleural effusions raising the question of anasarca. There is some haziness in the mesentery which may be related to mesenteric congestion in this context. Correlate with any abdominal symptoms trace fluid may be present in this patient with some mesenteric congestion. 6. Aortic atherosclerosis.  Impression and Plan:  87 year old woman with:  1.  Stage IV bladder cancer documented in April 2022.  She presented with pleural effusion and imaging studies showed widespread disease including adenopathy and hepatic metastasis.  CT scan obtained on January 02, 2021 as well as the cytology obtained on her pleural fluid was also reviewed.  Treatment options were discussed at this time including systemic chemotherapy, immunotherapy, antibiotic drug conjugate versus supportive care and hospice.  Given her advanced disease and overall poor performance status she would be a marginal candidate for systemic chemotherapy.   After discussion today, she opted against any anticancer treatments and opted to proceed with supportive management only.  We have discussed about the role of hospice as well as her overall prognosis.  Her prognosis is very poor with limited life expectancy of likely less than 6 months and would be hospice eligible.  The patient and her family will consider that option and let me know in the future.  2.  Pleural effusion: This could be addressed in the future with repeat thoracentesis.   3.  Follow-up: No further oncology follow-up will be scheduled at this time based on patient's wishes and the possibility of  enrollment in hospice.   I discussed the assessment and treatment plan with the patient. The patient was provided an opportunity to ask questions and all were answered. The patient agreed with the plan and demonstrated an understanding of the instructions.   The patient was advised to call back or seek an in-person evaluation if the symptoms worsen  or if the condition fails to improve as anticipated.  I provided 20 minutes of non face-to-face telephone visit time during this encounter.  The time was dedicated to reviewing imaging studies, pathology results, treatment options and future plan of care review.   [name], MD 01/12/2021 8:05 AM

## 2021-01-13 ENCOUNTER — Telehealth: Payer: Self-pay | Admitting: *Deleted

## 2021-01-13 ENCOUNTER — Encounter: Payer: Self-pay | Admitting: Physician Assistant

## 2021-01-13 ENCOUNTER — Ambulatory Visit: Payer: Medicare Other | Admitting: Physician Assistant

## 2021-01-13 ENCOUNTER — Other Ambulatory Visit: Payer: Self-pay

## 2021-01-13 VITALS — BP 130/60 | HR 63 | Ht 59.0 in | Wt 98.2 lb

## 2021-01-13 DIAGNOSIS — I480 Paroxysmal atrial fibrillation: Secondary | ICD-10-CM | POA: Diagnosis not present

## 2021-01-13 DIAGNOSIS — Z95828 Presence of other vascular implants and grafts: Secondary | ICD-10-CM

## 2021-01-13 DIAGNOSIS — I10 Essential (primary) hypertension: Secondary | ICD-10-CM

## 2021-01-13 DIAGNOSIS — Z952 Presence of prosthetic heart valve: Secondary | ICD-10-CM

## 2021-01-13 DIAGNOSIS — I739 Peripheral vascular disease, unspecified: Secondary | ICD-10-CM

## 2021-01-13 DIAGNOSIS — I5032 Chronic diastolic (congestive) heart failure: Secondary | ICD-10-CM

## 2021-01-13 NOTE — Patient Instructions (Signed)
Medication Instructions:  Stop Plavix March 06, 2021 *If you need a refill on your cardiac medications before your next appointment, please call your pharmacy*   Lab Work: No Labs If you have labs (blood work) drawn today and your tests are completely normal, you will receive your results only by: [name] MyChart Message (if you have MyChart) OR . A paper copy in the mail If you have any lab test that is abnormal or we need to change your treatment, we will call you to review the results.   Testing/Procedures: No Testing   Follow-Up: At [facility], you and your health needs are our priority.  As part of our continuing mission to provide you with exceptional heart care, we have created designated Provider Care Teams.  These Care Teams include your primary Cardiologist (physician) and Advanced Practice Providers (APPs -  Physician Assistants and Nurse Practitioners) who all work together to provide you with the care you need, when you need it.  Your next appointment:   Follow up as Needed  The format for your next appointment:   In Person  Provider:   [name], MD

## 2021-01-13 NOTE — Telephone Encounter (Signed)
Referral called to [facility]. Daughter notified

## 2021-01-13 NOTE — Telephone Encounter (Signed)
Agree. I will not be the attending.

## 2021-01-13 NOTE — Telephone Encounter (Signed)
Daughter called and says they want to go ahead and sign up with Hospice. RN will call in referral.

## 2021-01-14 DIAGNOSIS — N179 Acute kidney failure, unspecified: Secondary | ICD-10-CM | POA: Diagnosis not present

## 2021-01-14 DIAGNOSIS — N1832 Chronic kidney disease, stage 3b: Secondary | ICD-10-CM | POA: Diagnosis not present

## 2021-01-14 DIAGNOSIS — I495 Sick sinus syndrome: Secondary | ICD-10-CM | POA: Diagnosis not present

## 2021-01-14 DIAGNOSIS — I5033 Acute on chronic diastolic (congestive) heart failure: Secondary | ICD-10-CM | POA: Diagnosis not present

## 2021-01-14 DIAGNOSIS — I739 Peripheral vascular disease, unspecified: Secondary | ICD-10-CM | POA: Diagnosis not present

## 2021-01-17 ENCOUNTER — Other Ambulatory Visit: Payer: Self-pay | Admitting: Internal Medicine

## 2021-01-17 DIAGNOSIS — I480 Paroxysmal atrial fibrillation: Secondary | ICD-10-CM

## 2021-01-17 DIAGNOSIS — I10 Essential (primary) hypertension: Secondary | ICD-10-CM

## 2021-01-23 ENCOUNTER — Telehealth: Payer: Self-pay | Admitting: *Deleted

## 2021-01-23 NOTE — Telephone Encounter (Signed)
Daughter, [name], called and reported patient's heart rate is 133 bpm this morning. She was given Metoprolol 50 mg XL. Returned call to daughter and pulse is 68 and BP is 126/67. Per Dr Melford Aase, if the pulse goes above 100, the patient can take and extra Metoprolol.

## 2021-01-28 ENCOUNTER — Ambulatory Visit: Payer: Medicare Other | Admitting: Internal Medicine

## 2021-01-29 DIAGNOSIS — L931 Subacute cutaneous lupus erythematosus: Secondary | ICD-10-CM | POA: Diagnosis not present

## 2021-01-29 DIAGNOSIS — T887XXA Unspecified adverse effect of drug or medicament, initial encounter: Secondary | ICD-10-CM | POA: Diagnosis not present

## 2021-02-03 ENCOUNTER — Other Ambulatory Visit: Payer: Self-pay | Admitting: *Deleted

## 2021-02-03 ENCOUNTER — Telehealth: Payer: Self-pay | Admitting: *Deleted

## 2021-02-03 DIAGNOSIS — C641 Malignant neoplasm of right kidney, except renal pelvis: Secondary | ICD-10-CM

## 2021-02-03 NOTE — Telephone Encounter (Signed)
Patient's daughter informed patient has order for thoracentesis, RN gave Central Scheduling number, daughter instructed to call to get procedure set up.  She verbalizes understanding.

## 2021-02-06 ENCOUNTER — Ambulatory Visit (HOSPITAL_COMMUNITY)
Admission: RE | Admit: 2021-02-06 | Discharge: 2021-02-06 | Disposition: A | Discharge status: Home / Self Care | Source: Ambulatory Visit | Attending: Oncology | Admitting: Oncology

## 2021-02-06 ENCOUNTER — Other Ambulatory Visit (HOSPITAL_COMMUNITY): Payer: Self-pay | Admitting: Student

## 2021-02-06 ENCOUNTER — Other Ambulatory Visit: Payer: Self-pay

## 2021-02-06 ENCOUNTER — Ambulatory Visit (HOSPITAL_COMMUNITY)
Admission: RE | Admit: 2021-02-06 | Discharge: 2021-02-06 | Disposition: A | Discharge status: Home / Self Care | Source: Ambulatory Visit | Attending: Student | Admitting: Student

## 2021-02-06 DIAGNOSIS — C641 Malignant neoplasm of right kidney, except renal pelvis: Secondary | ICD-10-CM

## 2021-02-06 DIAGNOSIS — J90 Pleural effusion, not elsewhere classified: Secondary | ICD-10-CM | POA: Diagnosis not present

## 2021-02-06 HISTORY — PX: IR THORACENTESIS ASP PLEURAL SPACE W/IMG GUIDE: IMG5380

## 2021-02-06 MED ORDER — LIDOCAINE HCL (PF) 1 % IJ SOLN
INTRAMUSCULAR | Status: AC
  Filled 2021-02-06: qty 30

## 2021-02-06 MED ORDER — LIDOCAINE HCL (PF) 1 % IJ SOLN
INTRAMUSCULAR | Status: DC | PRN
  Administered 2021-02-06: 5 mL

## 2021-02-06 NOTE — Procedures (Signed)
PROCEDURE SUMMARY:  Successful image-guided right thoracentesis. Yielded 800 milliliters of hazy amber fluid. Procedure was stopped after 800 milliliters per patient's request secondary to patient's symptom (cough). Patient tolerated procedure well. No immediate complications. EBL = 0 mL.  Specimen was not sent for labs. CXR ordered.  Please see imaging section of Epic for full dictation.   Claris Pong Zavon Hyson PA-C 02/06/2021 1:14 PM

## 2021-02-08 ENCOUNTER — Other Ambulatory Visit: Payer: Self-pay | Admitting: Adult Health

## 2021-02-10 ENCOUNTER — Ambulatory Visit (HOSPITAL_COMMUNITY): Payer: Medicare Other

## 2021-02-12 ENCOUNTER — Encounter: Payer: Medicare Other | Admitting: Internal Medicine

## 2021-03-06 ENCOUNTER — Encounter: Payer: Self-pay | Admitting: Hematology

## 2021-03-06 DEATH — deceased

## 2021-03-11 ENCOUNTER — Ambulatory Visit: Payer: Medicare Other | Admitting: Cardiology

## 2021-03-30 ENCOUNTER — Encounter: Payer: Medicare Other | Admitting: Internal Medicine

## 2021-07-16 IMAGING — CT CT ABD-PELV W/O CM
2 of 4 series · 12 of 36 positions shown, 15 images · non-contrast
Comparison: CT evaluation from Wednesday June, 2018. CT angiography from
October 09, 2019

CLINICAL DATA: Urologic cancer, assess treatment response in the
setting of RIGHT renal cancer post nephrectomy.

EXAM:
CT CHEST, ABDOMEN AND PELVIS WITHOUT CONTRAST
TECHNIQUE: Multidetector CT imaging of the chest, abdomen and pelvis was
performed following the standard protocol without IV contrast.

[Series 2: cap w/o · axial · non-contrast · 0.77mm/px · z∈[-546,-81]mm · 9 of 115 slices shown, 12 images]
[im 11/115  mediastinal]
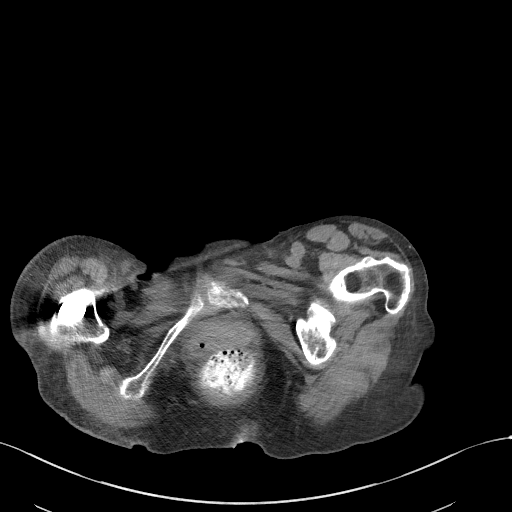
[im 11/115  lung]
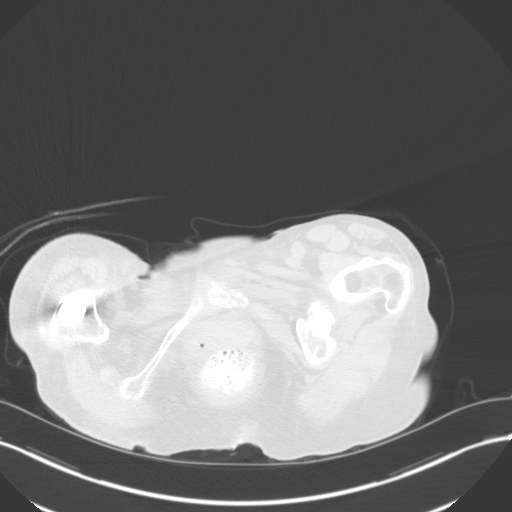
[im 21/115  lung]
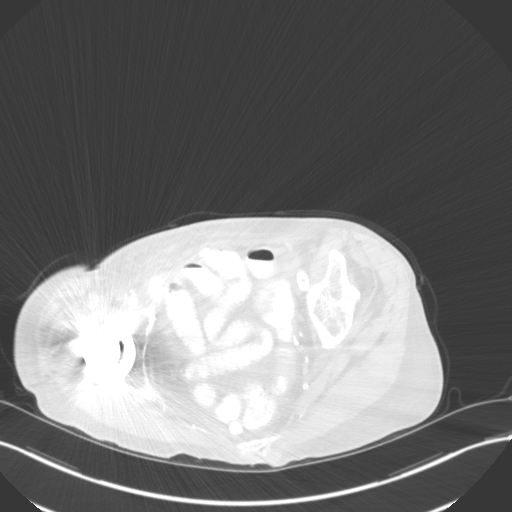
[im 32/115  lung]
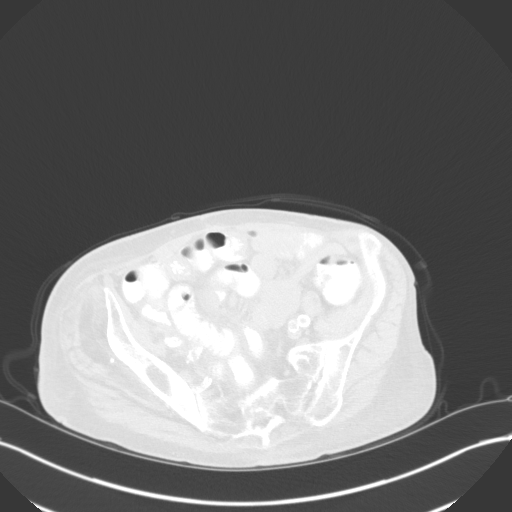
[im 42/115  lung]
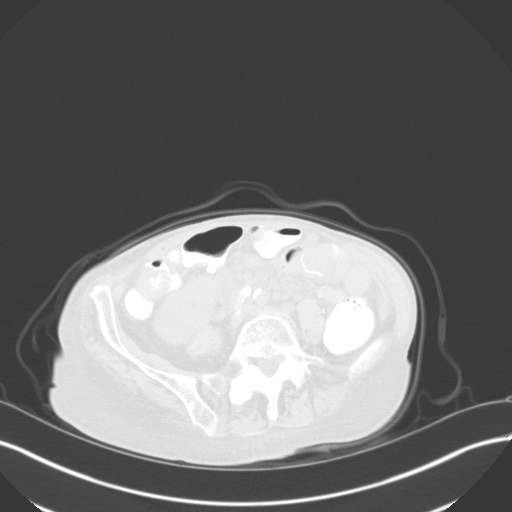
[im 63/115  mediastinal]
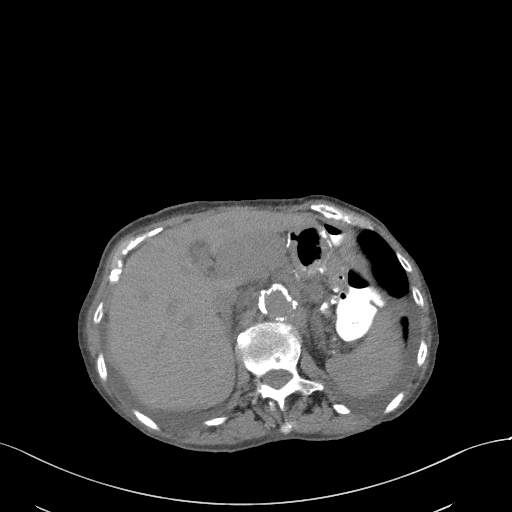
[im 63/115  lung]
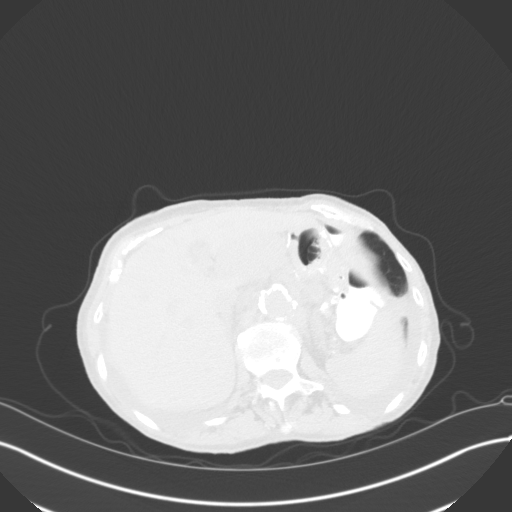
[im 73/115  lung]
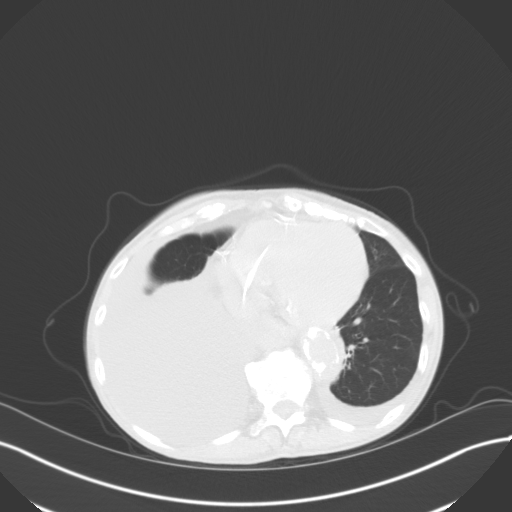
[im 83/115  lung]
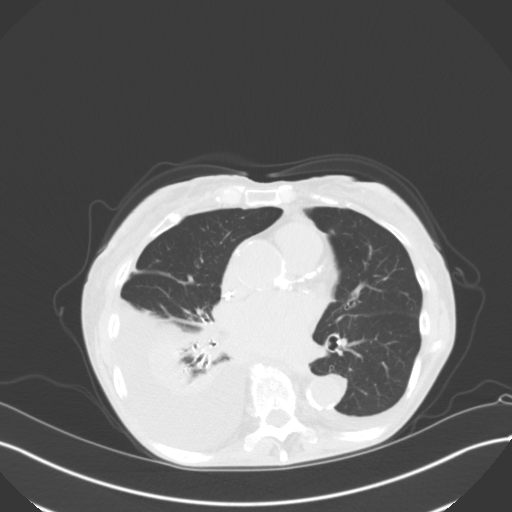
[im 94/115  lung]
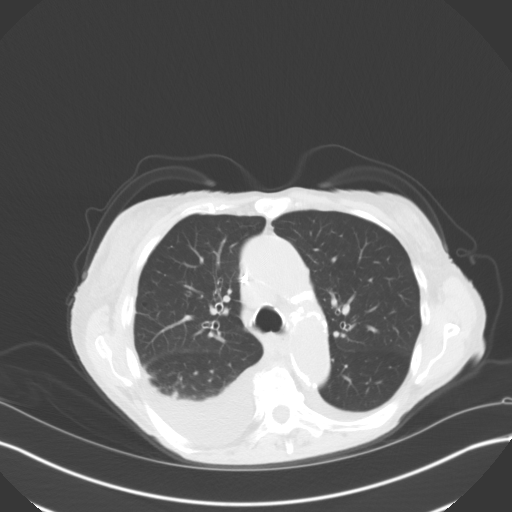
[im 104/115  mediastinal]
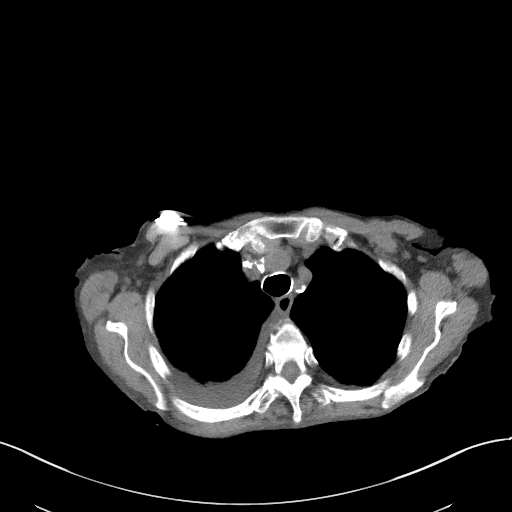
[im 104/115  lung]
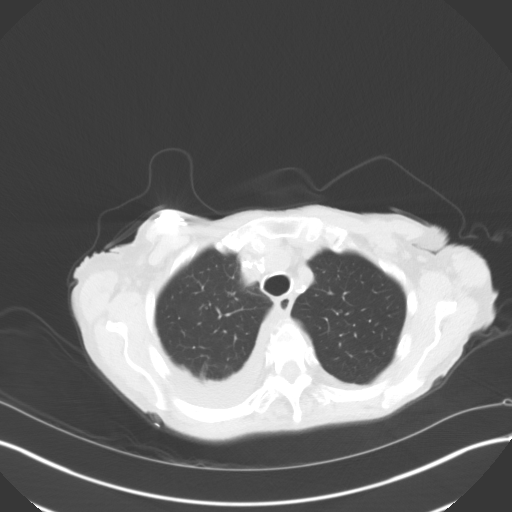

[Series 5: coronals · coronal · 0.82mm/px · 3 of 121 slices shown]
[im 25/121  lung]
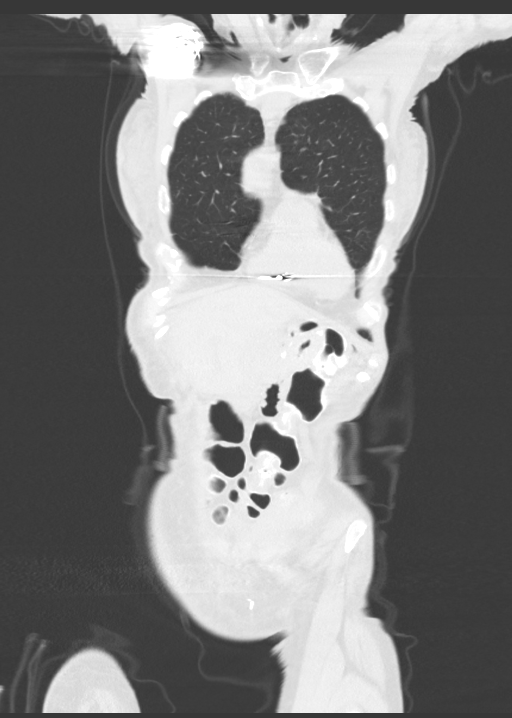
[im 49/121  lung]
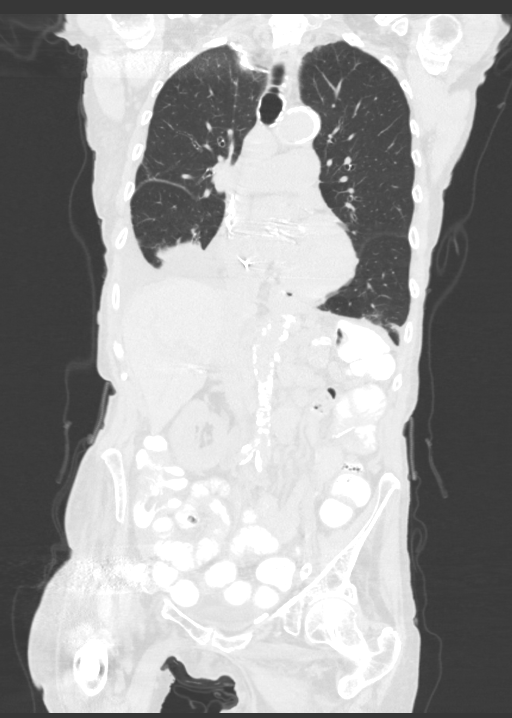
[im 73/121  lung]
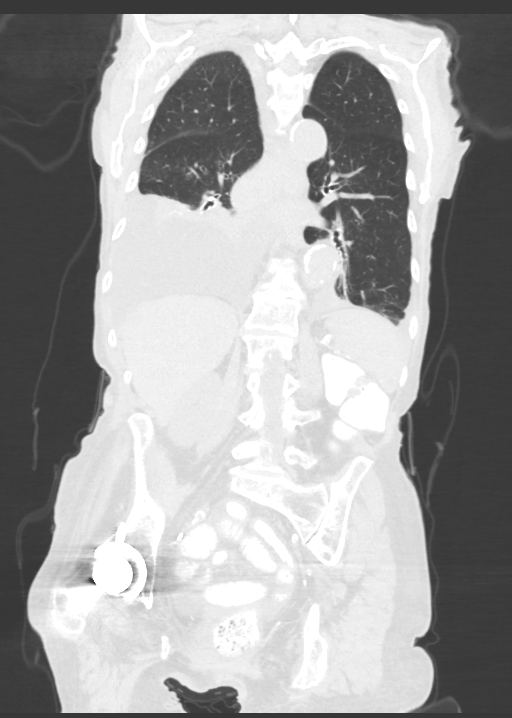

[12 of 36 positions shown; findings below may reference images not displayed]

FINDINGS: CT CHEST FINDINGS

Cardiovascular: Calcified atheromatous plaque and changes of trans
arterial aortic valve replacement. Aortic caliber similar to prior
study.

Heart size normal without pericardial effusion. Three-vessel
coronary artery disease. Dual lead pacer device in place in the
RIGHT heart. Lead in the RIGHT atrium and RIGHT ventricle. Limited
assessment of cardiovascular structures given lack of intravenous
contrast.

Mediastinum/Nodes: Bulky mediastinal adenopathy. RIGHT paratracheal
lymph node previously at 9 mm now at 20 mm image [DATE].

Bulky subcarinal adenopathy image [DATE] 31 mm previously 11 mm.

No thoracic inlet lymphadenopathy.

No axillary lymphadenopathy.

No gross hilar nodal enlargement.

Lungs/Pleura: Persistent bilateral pleural fluid collections RIGHT
greater than LEFT, sub pulmonic fluid on the RIGHT. Consolidative
changes at the RIGHT lung base worse than on prior imaging.

New pulmonary nodule image 84/4 7 mm in the RIGHT lower lobe.

New pulmonary nodule in the LEFT upper lobe image [DATE] 4 mm.

Nodular changes along the LEFT hemidiaphragm with focal area
measuring approximately 13 by 12 mm image 125/4. There is airspace
disease adjacent to this along the medial LEFT chest.

Musculoskeletal: See below for full musculoskeletal details.
Osteopenia. Spinal degenerative changes. No destructive bone finding
about the bony thorax.

CT ABDOMEN PELVIS FINDINGS

Hepatobiliary: Low-density hepatic lesion (image 49, series [DATE] x
2.5 cm new from previous imaging. Post cholecystectomy. Biliary duct
dilation unchanged.

Pancreas: Pancreatic mass versus adjacent adenopathy measuring up to
5.9 x 3.0 cm image 56/2. No peripancreatic stranding. No gross
ductal dilation.

Spleen: Grossly normal.  Limited assessment on noncontrast imaging.

Adrenals/Urinary Tract: Adrenal glands are not well evaluated. There
are numerous soft tissue nodules in the area of the LEFT adrenal
gland. RIGHT adrenal gland is grossly normal.

Smooth renal contour on the RIGHT. No hydronephrosis. Urinary
bladder is under distended.

Stomach/Bowel: Post gastric bypass. No sign of bowel obstruction.
Appendix not visible. No gross signs of inflammation. There is some
mesenteric haziness at may be congestive changes. Findings are
nonspecific.

Vascular/Lymphatic: Calcified atheromatous plaque of the abdominal
aorta. Bulky retroperitoneal adenopathy has developed since the
previous study.

Image 66/2 3.5 x 2.7 cm LEFT retroperitoneal nodal mass.

Bulky lymph nodes along the intra-aortocaval groove and in the
celiac region as well as in the lower chest just above the
retrocrural station (image 45/2) 18 mm. No gross mesenteric
adenopathy.

Reproductive: Post hysterectomy. No gross adnexal mass. No pelvic
sidewall lymphadenopathy. Streak artifact from RIGHT hip
arthroplasty does limit assessment of pelvic structures. RIGHT groin
lymph node with mild enlargement nonspecific 12 mm unchanged from
previous imaging.

Other: No free air. Trace fluid may be present in this patient with
some mesenteric congestion suggested as described above. No gross
peritoneal nodularity. Study is limited.

Musculoskeletal: Spinal degenerative changes. Osteopenia. Findings
of RIGHT hip arthroplasty.
IMPRESSION: 1. Worsening of disease with bulky adenopathy in the chest
associated with small pulmonary nodules and bulky adenopathy with
potential pancreatic mass in the upper abdomen and retroperitoneum.
2. Hepatic metastatic disease new from previous imaging.
3. Nodular metastasis suspected at the LEFT lung base amidst
airspace disease.
4. Large RIGHT-sided pleural effusion increased from the prior study
with decreased pleural fluid in the LEFT chest.
5. of body wall edema and pleural effusions raising the question of
anasarca. There is some haziness in the mesentery which may be
related to mesenteric congestion in this context. Correlate with any
abdominal symptoms trace fluid may be present in this patient with
some mesenteric congestion.
6. Aortic atherosclerosis.

## 2021-08-19 ENCOUNTER — Ambulatory Visit: Payer: Medicare Other | Admitting: Adult Health
